# Patient Record
Sex: Female | Born: 1975 | Race: Black or African American | Hispanic: No | Marital: Single | Smoking: Former smoker
Health system: Southern US, Community
[De-identification: ages and names within clinical notes are randomized; demographics above are authoritative.]

## PROBLEM LIST (undated history)

## (undated) DIAGNOSIS — D473 Essential (hemorrhagic) thrombocythemia: Secondary | ICD-10-CM

## (undated) DIAGNOSIS — E669 Obesity, unspecified: Secondary | ICD-10-CM

## (undated) DIAGNOSIS — N491 Inflammatory disorders of spermatic cord, tunica vaginalis and vas deferens: Secondary | ICD-10-CM

## (undated) DIAGNOSIS — D759 Disease of blood and blood-forming organs, unspecified: Secondary | ICD-10-CM

## (undated) DIAGNOSIS — I10 Essential (primary) hypertension: Secondary | ICD-10-CM

## (undated) DIAGNOSIS — N926 Irregular menstruation, unspecified: Secondary | ICD-10-CM

## (undated) DIAGNOSIS — IMO0002 Reserved for concepts with insufficient information to code with codable children: Secondary | ICD-10-CM

## (undated) DIAGNOSIS — K219 Gastro-esophageal reflux disease without esophagitis: Secondary | ICD-10-CM

## (undated) DIAGNOSIS — K2970 Gastritis, unspecified, without bleeding: Secondary | ICD-10-CM

## (undated) DIAGNOSIS — Z87448 Personal history of other diseases of urinary system: Secondary | ICD-10-CM

## (undated) DIAGNOSIS — E118 Type 2 diabetes mellitus with unspecified complications: Secondary | ICD-10-CM

## (undated) DIAGNOSIS — M797 Fibromyalgia: Secondary | ICD-10-CM

## (undated) DIAGNOSIS — G629 Polyneuropathy, unspecified: Secondary | ICD-10-CM

## (undated) DIAGNOSIS — I639 Cerebral infarction, unspecified: Secondary | ICD-10-CM

## (undated) DIAGNOSIS — J45998 Other asthma: Secondary | ICD-10-CM

## (undated) DIAGNOSIS — R112 Nausea with vomiting, unspecified: Secondary | ICD-10-CM

## (undated) DIAGNOSIS — J189 Pneumonia, unspecified organism: Secondary | ICD-10-CM

## (undated) DIAGNOSIS — F329 Major depressive disorder, single episode, unspecified: Secondary | ICD-10-CM

## (undated) DIAGNOSIS — I2510 Atherosclerotic heart disease of native coronary artery without angina pectoris: Secondary | ICD-10-CM

## (undated) DIAGNOSIS — E785 Hyperlipidemia, unspecified: Secondary | ICD-10-CM

## (undated) DIAGNOSIS — D509 Iron deficiency anemia, unspecified: Secondary | ICD-10-CM

## (undated) DIAGNOSIS — B181 Chronic viral hepatitis B without delta-agent: Secondary | ICD-10-CM

## (undated) DIAGNOSIS — D75839 Thrombocytosis, unspecified: Secondary | ICD-10-CM

## (undated) DIAGNOSIS — I219 Acute myocardial infarction, unspecified: Secondary | ICD-10-CM

## (undated) DIAGNOSIS — L0292 Furuncle, unspecified: Secondary | ICD-10-CM

## (undated) DIAGNOSIS — F32A Depression, unspecified: Secondary | ICD-10-CM

## (undated) DIAGNOSIS — F419 Anxiety disorder, unspecified: Secondary | ICD-10-CM

## (undated) DIAGNOSIS — G4733 Obstructive sleep apnea (adult) (pediatric): Secondary | ICD-10-CM

## (undated) DIAGNOSIS — F1910 Other psychoactive substance abuse, uncomplicated: Secondary | ICD-10-CM

## (undated) DIAGNOSIS — E1165 Type 2 diabetes mellitus with hyperglycemia: Secondary | ICD-10-CM

## (undated) DIAGNOSIS — K3184 Gastroparesis: Secondary | ICD-10-CM

## (undated) DIAGNOSIS — L0293 Carbuncle, unspecified: Secondary | ICD-10-CM

## (undated) HISTORY — DX: Polyneuropathy, unspecified: G62.9

## (undated) HISTORY — DX: Essential (hemorrhagic) thrombocythemia: D47.3

## (undated) HISTORY — DX: Obstructive sleep apnea (adult) (pediatric): G47.33

## (undated) HISTORY — DX: Atherosclerotic heart disease of native coronary artery without angina pectoris: I25.10

## (undated) HISTORY — DX: Gastroparesis: K31.84

## (undated) HISTORY — DX: Chronic viral hepatitis B without delta-agent: B18.1

## (undated) HISTORY — DX: Fibromyalgia: M79.7

## (undated) HISTORY — DX: Carbuncle, unspecified: L02.93

## (undated) HISTORY — DX: Thrombocytosis, unspecified: D75.839

## (undated) HISTORY — DX: Furuncle, unspecified: L02.92

## (undated) HISTORY — DX: Major depressive disorder, single episode, unspecified: F32.9

## (undated) HISTORY — DX: Inflammatory disorders of spermatic cord, tunica vaginalis and vas deferens: N49.1

## (undated) HISTORY — DX: Irregular menstruation, unspecified: N92.6

## (undated) HISTORY — DX: Obesity, unspecified: E66.9

## (undated) HISTORY — DX: Personal history of other diseases of urinary system: Z87.448

## (undated) HISTORY — DX: Gastritis, unspecified, without bleeding: K29.70

## (undated) HISTORY — DX: Essential (primary) hypertension: I10

## (undated) HISTORY — DX: Depression, unspecified: F32.A

## (undated) HISTORY — DX: Hyperlipidemia, unspecified: E78.5

## (undated) HISTORY — DX: Iron deficiency anemia, unspecified: D50.9

## (undated) HISTORY — DX: Nausea with vomiting, unspecified: R11.2

---
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
## 1997-09-08 ENCOUNTER — Other Ambulatory Visit: Admission: RE | Admit: 1997-09-08 | Discharge: 1997-09-08 | Payer: Self-pay | Admitting: Obstetrics

## 1998-01-15 ENCOUNTER — Inpatient Hospital Stay (HOSPITAL_COMMUNITY): Admission: EM | Admit: 1998-01-15 | Discharge: 1998-01-19 | Payer: Self-pay | Admitting: Emergency Medicine

## 1998-01-18 ENCOUNTER — Encounter: Payer: Self-pay | Admitting: Internal Medicine

## 1998-01-28 ENCOUNTER — Encounter: Admission: RE | Admit: 1998-01-28 | Discharge: 1998-01-28 | Payer: Self-pay | Admitting: Internal Medicine

## 1998-06-18 ENCOUNTER — Emergency Department (HOSPITAL_COMMUNITY): Admission: EM | Admit: 1998-06-18 | Discharge: 1998-06-18 | Payer: Self-pay | Admitting: Emergency Medicine

## 1998-11-13 ENCOUNTER — Emergency Department (HOSPITAL_COMMUNITY): Admission: EM | Admit: 1998-11-13 | Discharge: 1998-11-13 | Payer: Self-pay | Admitting: Emergency Medicine

## 1998-11-28 ENCOUNTER — Emergency Department (HOSPITAL_COMMUNITY): Admission: EM | Admit: 1998-11-28 | Discharge: 1998-11-28 | Payer: Self-pay | Admitting: Emergency Medicine

## 1998-11-28 ENCOUNTER — Encounter: Payer: Self-pay | Admitting: *Deleted

## 1998-12-16 ENCOUNTER — Emergency Department (HOSPITAL_COMMUNITY): Admission: EM | Admit: 1998-12-16 | Discharge: 1998-12-16 | Payer: Self-pay | Admitting: Emergency Medicine

## 1999-08-08 ENCOUNTER — Inpatient Hospital Stay (HOSPITAL_COMMUNITY): Admission: AD | Admit: 1999-08-08 | Discharge: 1999-08-08 | Payer: Self-pay | Admitting: Obstetrics

## 2000-09-25 ENCOUNTER — Inpatient Hospital Stay (HOSPITAL_COMMUNITY): Admission: AD | Admit: 2000-09-25 | Discharge: 2000-09-25 | Payer: Self-pay | Admitting: Obstetrics

## 2001-06-10 ENCOUNTER — Encounter: Payer: Self-pay | Admitting: Emergency Medicine

## 2001-06-11 ENCOUNTER — Inpatient Hospital Stay (HOSPITAL_COMMUNITY): Admission: EM | Admit: 2001-06-11 | Discharge: 2001-06-14 | Payer: Self-pay | Admitting: Emergency Medicine

## 2001-06-26 ENCOUNTER — Encounter: Admission: RE | Admit: 2001-06-26 | Discharge: 2001-06-26 | Payer: Self-pay | Admitting: Internal Medicine

## 2001-06-26 ENCOUNTER — Encounter: Payer: Self-pay | Admitting: *Deleted

## 2001-06-26 ENCOUNTER — Ambulatory Visit (HOSPITAL_COMMUNITY): Admission: RE | Admit: 2001-06-26 | Discharge: 2001-06-26 | Payer: Self-pay

## 2001-10-03 ENCOUNTER — Inpatient Hospital Stay (HOSPITAL_COMMUNITY): Admission: AD | Admit: 2001-10-03 | Discharge: 2001-10-03 | Payer: Self-pay | Admitting: Obstetrics

## 2001-10-03 ENCOUNTER — Encounter: Payer: Self-pay | Admitting: Obstetrics

## 2002-03-25 ENCOUNTER — Encounter: Payer: Self-pay | Admitting: Emergency Medicine

## 2002-03-25 ENCOUNTER — Emergency Department (HOSPITAL_COMMUNITY): Admission: EM | Admit: 2002-03-25 | Discharge: 2002-03-25 | Payer: Self-pay | Admitting: Emergency Medicine

## 2003-03-02 ENCOUNTER — Emergency Department (HOSPITAL_COMMUNITY): Admission: EM | Admit: 2003-03-02 | Discharge: 2003-03-02 | Payer: Self-pay | Admitting: Emergency Medicine

## 2003-03-07 ENCOUNTER — Emergency Department (HOSPITAL_COMMUNITY): Admission: EM | Admit: 2003-03-07 | Discharge: 2003-03-07 | Payer: Self-pay | Admitting: Emergency Medicine

## 2003-03-09 ENCOUNTER — Emergency Department (HOSPITAL_COMMUNITY): Admission: EM | Admit: 2003-03-09 | Discharge: 2003-03-09 | Payer: Self-pay | Admitting: Emergency Medicine

## 2003-03-12 ENCOUNTER — Emergency Department (HOSPITAL_COMMUNITY): Admission: EM | Admit: 2003-03-12 | Discharge: 2003-03-12 | Payer: Self-pay | Admitting: Emergency Medicine

## 2003-09-06 ENCOUNTER — Emergency Department (HOSPITAL_COMMUNITY): Admission: EM | Admit: 2003-09-06 | Discharge: 2003-09-06 | Payer: Self-pay | Admitting: Emergency Medicine

## 2003-09-24 ENCOUNTER — Emergency Department (HOSPITAL_COMMUNITY): Admission: EM | Admit: 2003-09-24 | Discharge: 2003-09-24 | Payer: Self-pay | Admitting: Emergency Medicine

## 2004-01-15 ENCOUNTER — Emergency Department (HOSPITAL_COMMUNITY): Admission: EM | Admit: 2004-01-15 | Discharge: 2004-01-15 | Payer: Self-pay | Admitting: Emergency Medicine

## 2004-06-08 ENCOUNTER — Emergency Department (HOSPITAL_COMMUNITY): Admission: EM | Admit: 2004-06-08 | Discharge: 2004-06-08 | Payer: Self-pay | Admitting: Emergency Medicine

## 2004-09-24 ENCOUNTER — Emergency Department (HOSPITAL_COMMUNITY): Admission: EM | Admit: 2004-09-24 | Discharge: 2004-09-25 | Payer: Self-pay | Admitting: Emergency Medicine

## 2004-11-16 ENCOUNTER — Emergency Department (HOSPITAL_COMMUNITY): Admission: EM | Admit: 2004-11-16 | Discharge: 2004-11-17 | Payer: Self-pay | Admitting: Emergency Medicine

## 2004-11-25 ENCOUNTER — Emergency Department (HOSPITAL_COMMUNITY): Admission: EM | Admit: 2004-11-25 | Discharge: 2004-11-25 | Payer: Self-pay | Admitting: Emergency Medicine

## 2004-11-28 ENCOUNTER — Ambulatory Visit: Payer: Self-pay | Admitting: Internal Medicine

## 2004-12-17 ENCOUNTER — Emergency Department (HOSPITAL_COMMUNITY): Admission: EM | Admit: 2004-12-17 | Discharge: 2004-12-17 | Payer: Self-pay | Admitting: Emergency Medicine

## 2004-12-27 ENCOUNTER — Emergency Department (HOSPITAL_COMMUNITY): Admission: EM | Admit: 2004-12-27 | Discharge: 2004-12-27 | Payer: Self-pay | Admitting: Emergency Medicine

## 2005-01-09 ENCOUNTER — Inpatient Hospital Stay (HOSPITAL_COMMUNITY): Admission: AD | Admit: 2005-01-09 | Discharge: 2005-01-18 | Payer: Self-pay | Admitting: Hospitalist

## 2005-01-09 ENCOUNTER — Ambulatory Visit: Payer: Self-pay | Admitting: Hospitalist

## 2005-01-09 ENCOUNTER — Encounter: Payer: Self-pay | Admitting: Emergency Medicine

## 2005-01-20 ENCOUNTER — Ambulatory Visit: Payer: Self-pay | Admitting: Internal Medicine

## 2005-01-27 ENCOUNTER — Ambulatory Visit: Payer: Self-pay | Admitting: Hospitalist

## 2005-02-03 ENCOUNTER — Ambulatory Visit: Payer: Self-pay | Admitting: Internal Medicine

## 2005-02-07 ENCOUNTER — Ambulatory Visit: Payer: Self-pay | Admitting: Internal Medicine

## 2005-02-08 ENCOUNTER — Ambulatory Visit: Payer: Self-pay | Admitting: Internal Medicine

## 2005-04-26 ENCOUNTER — Emergency Department (HOSPITAL_COMMUNITY): Admission: EM | Admit: 2005-04-26 | Discharge: 2005-04-26 | Payer: Self-pay | Admitting: Emergency Medicine

## 2005-05-30 IMAGING — CR DG CHEST 2V
2 series · 2 of 2 positions shown · non-contrast
Comparison: none

CLINICAL DATA: Headache and cough.  History of asthma.

[view not recorded (1 of 2)]
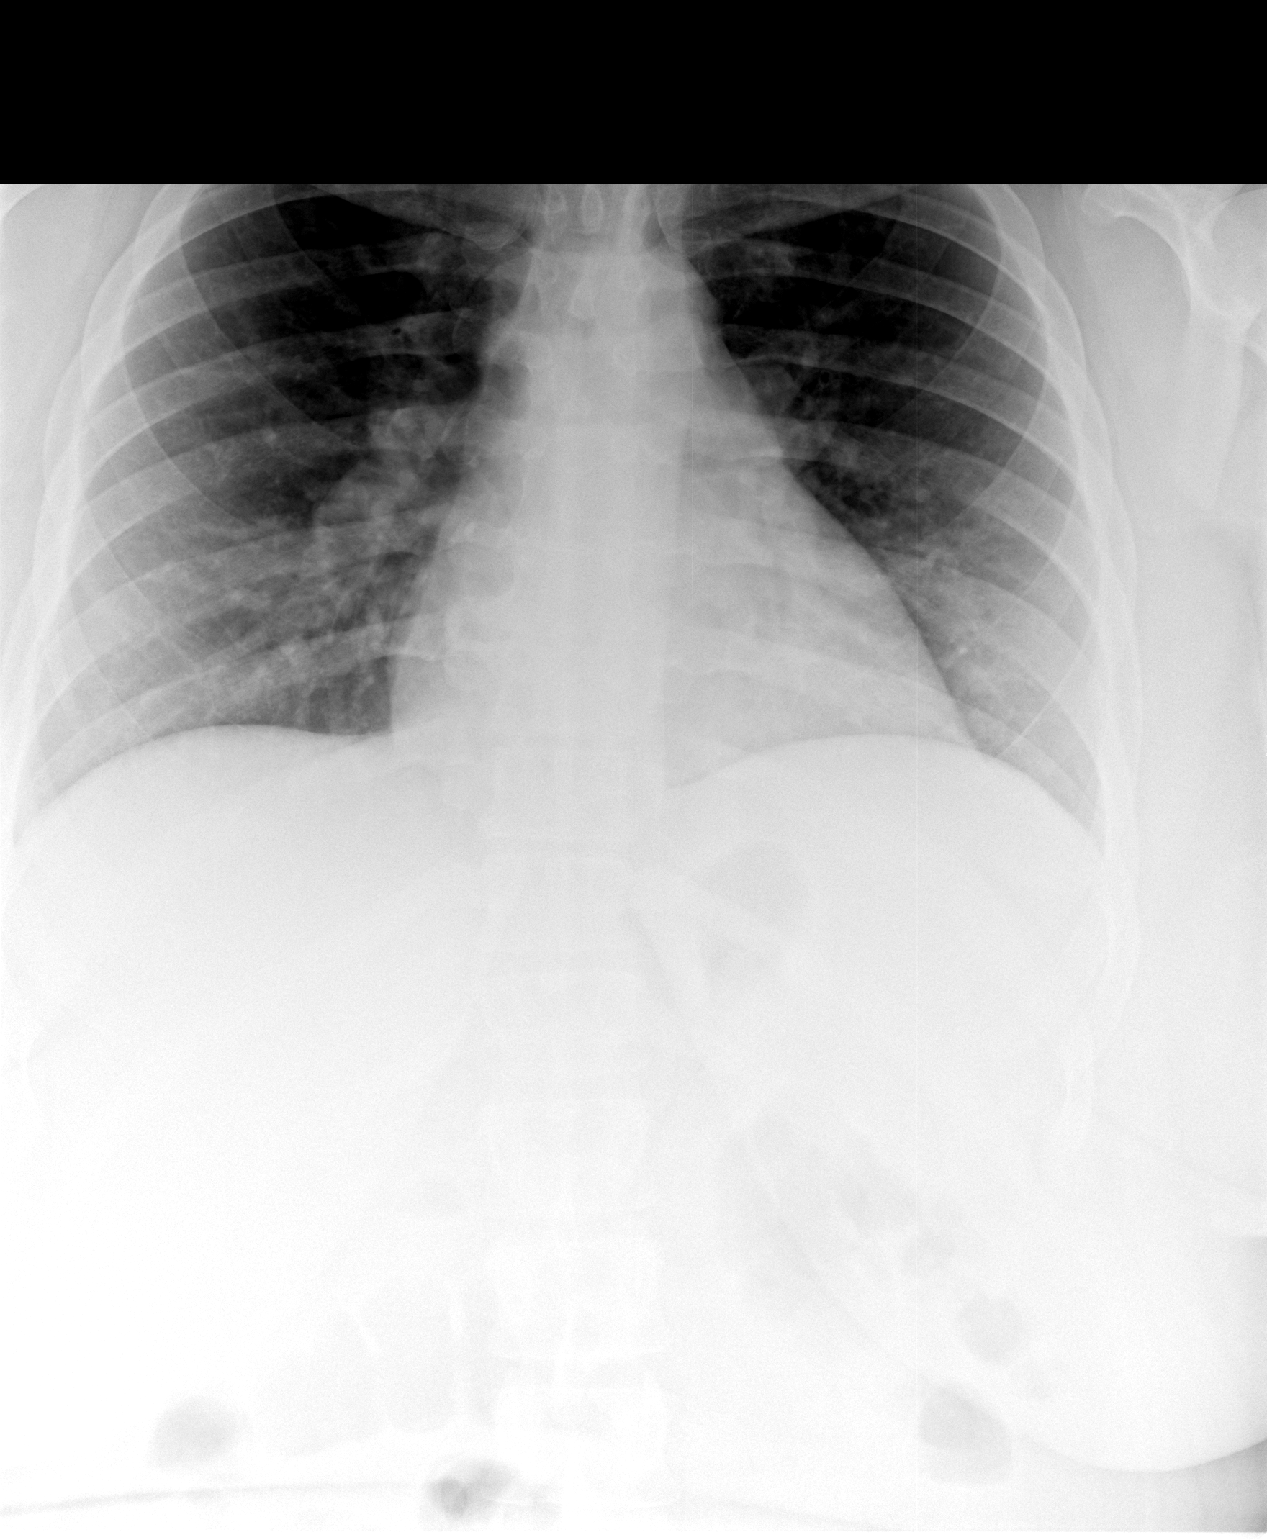

[view not recorded (2 of 2)]
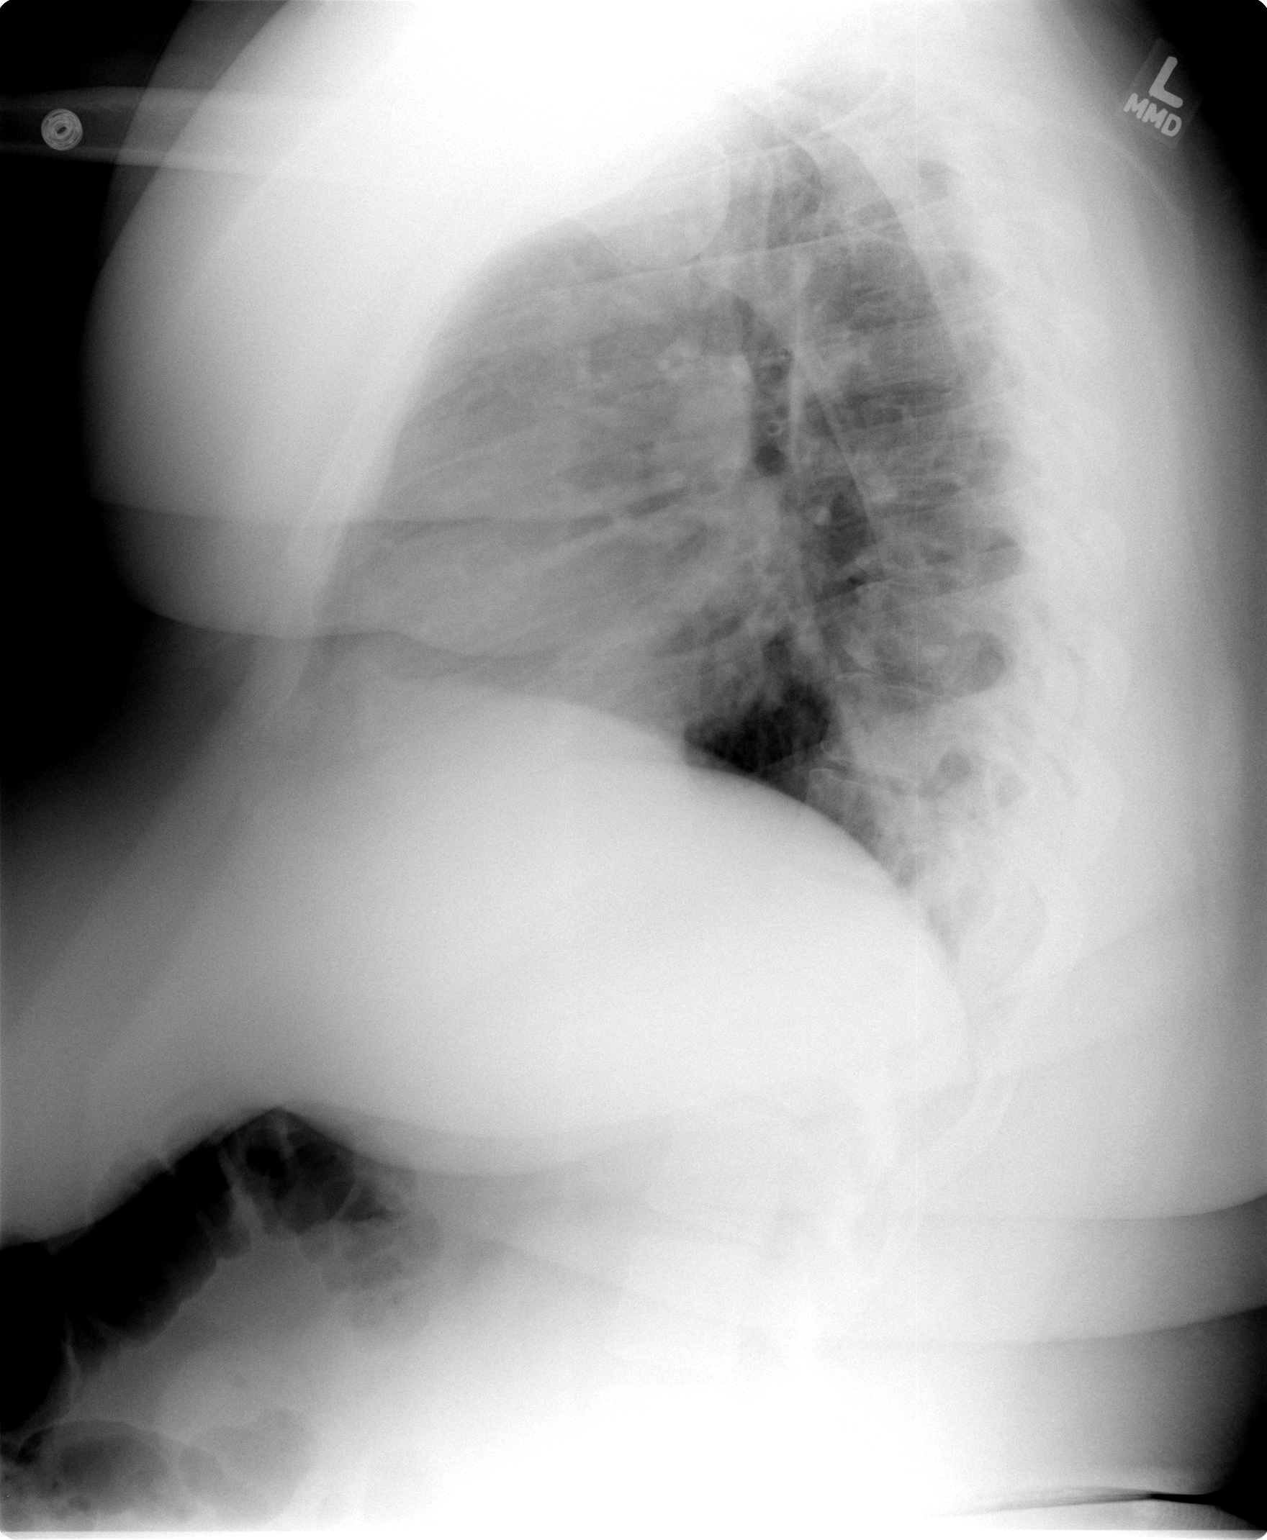

[2 of 2 positions shown; findings below may reference images not displayed]

PA AND LATERAL CHEST 
 Comparison 03/25/02.  

 The cardiomediastinal contours are stable.  There is stable peribronchial thickening.  Faint density in the left lung base does not obscure the hemidiaphragm but is not clearly attributed to overlying soft tissues and could reflect early infiltrate.  The right lung is clear.  There is no pleural effusion.  Osseous structures appear unremarkable.  

 IMPRESSION
 Stable cardiomegaly and peribronchial thickening.  I cannot exclude an early infiltrate at the left lung base. 

 [REDACTED]

## 2005-06-26 ENCOUNTER — Emergency Department (HOSPITAL_COMMUNITY): Admission: EM | Admit: 2005-06-26 | Discharge: 2005-06-26 | Payer: Self-pay | Admitting: Emergency Medicine

## 2005-07-13 ENCOUNTER — Emergency Department (HOSPITAL_COMMUNITY): Admission: EM | Admit: 2005-07-13 | Discharge: 2005-07-14 | Payer: Self-pay | Admitting: Emergency Medicine

## 2005-07-23 ENCOUNTER — Emergency Department (HOSPITAL_COMMUNITY): Admission: EM | Admit: 2005-07-23 | Discharge: 2005-07-23 | Payer: Self-pay | Admitting: Emergency Medicine

## 2005-11-03 ENCOUNTER — Ambulatory Visit: Payer: Self-pay | Admitting: Internal Medicine

## 2005-11-06 ENCOUNTER — Ambulatory Visit: Payer: Self-pay | Admitting: Internal Medicine

## 2005-11-14 ENCOUNTER — Ambulatory Visit: Payer: Self-pay | Admitting: Hospitalist

## 2005-11-15 ENCOUNTER — Ambulatory Visit: Payer: Self-pay | Admitting: Hospitalist

## 2006-01-03 ENCOUNTER — Ambulatory Visit (HOSPITAL_COMMUNITY): Admission: RE | Admit: 2006-01-03 | Discharge: 2006-01-03 | Payer: Self-pay | Admitting: Internal Medicine

## 2006-01-03 ENCOUNTER — Ambulatory Visit: Payer: Self-pay | Admitting: Cardiovascular Disease

## 2006-01-03 ENCOUNTER — Encounter: Payer: Self-pay | Admitting: Cardiovascular Disease

## 2006-01-05 ENCOUNTER — Ambulatory Visit: Payer: Self-pay | Admitting: Internal Medicine

## 2006-01-16 DIAGNOSIS — N926 Irregular menstruation, unspecified: Secondary | ICD-10-CM

## 2006-01-16 DIAGNOSIS — R609 Edema, unspecified: Secondary | ICD-10-CM | POA: Insufficient documentation

## 2006-01-16 DIAGNOSIS — I10 Essential (primary) hypertension: Secondary | ICD-10-CM | POA: Insufficient documentation
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
## 2006-03-05 ENCOUNTER — Emergency Department (HOSPITAL_COMMUNITY): Admission: EM | Admit: 2006-03-05 | Discharge: 2006-03-05 | Payer: Self-pay | Admitting: Emergency Medicine

## 2006-04-05 ENCOUNTER — Ambulatory Visit: Payer: Self-pay | Admitting: *Deleted

## 2006-04-05 ENCOUNTER — Encounter (INDEPENDENT_AMBULATORY_CARE_PROVIDER_SITE_OTHER): Payer: Self-pay | Admitting: Infectious Diseases

## 2006-04-05 LAB — CONVERTED CEMR LAB
ALT: 12 units/L (ref 0–35)
Alkaline Phosphatase: 67 units/L (ref 39–117)
MCHC: 32 g/dL — ABNORMAL LOW (ref 33.1–35.4)
MCV: 69.6 fL — ABNORMAL LOW (ref 78.8–100.0)
Platelets: 575 10*3/uL — ABNORMAL HIGH (ref 152–374)
RDW: 18.1 % — ABNORMAL HIGH (ref 11.5–15.3)
Rhuematoid fact SerPl-aCnc: <20 intl units/mL (ref 0–20)
Sodium: 139 meq/L (ref 135–145)
Total Bilirubin: 0.4 mg/dL (ref 0.3–1.2)
Total Protein: 7.8 g/dL (ref 6.0–8.3)

## 2006-04-24 HISTORY — PX: CORONARY ANGIOPLASTY WITH STENT PLACEMENT: SHX49

## 2006-04-25 ENCOUNTER — Emergency Department (HOSPITAL_COMMUNITY): Admission: EM | Admit: 2006-04-25 | Discharge: 2006-04-25 | Payer: Self-pay | Admitting: Emergency Medicine

## 2006-05-06 ENCOUNTER — Inpatient Hospital Stay (HOSPITAL_COMMUNITY): Admission: EM | Admit: 2006-05-06 | Discharge: 2006-05-11 | Payer: Self-pay | Admitting: Emergency Medicine

## 2006-05-10 ENCOUNTER — Encounter (INDEPENDENT_AMBULATORY_CARE_PROVIDER_SITE_OTHER): Payer: Self-pay | Admitting: *Deleted

## 2006-05-15 DIAGNOSIS — J45909 Unspecified asthma, uncomplicated: Secondary | ICD-10-CM

## 2006-05-15 DIAGNOSIS — G43909 Migraine, unspecified, not intractable, without status migrainosus: Secondary | ICD-10-CM | POA: Insufficient documentation

## 2006-05-25 DIAGNOSIS — I219 Acute myocardial infarction, unspecified: Secondary | ICD-10-CM

## 2006-05-25 HISTORY — DX: Acute myocardial infarction, unspecified: I21.9

## 2006-06-06 ENCOUNTER — Emergency Department (HOSPITAL_COMMUNITY): Admission: EM | Admit: 2006-06-06 | Discharge: 2006-06-06 | Payer: Self-pay | Admitting: Emergency Medicine

## 2006-06-07 ENCOUNTER — Inpatient Hospital Stay (HOSPITAL_COMMUNITY): Admission: EM | Admit: 2006-06-07 | Discharge: 2006-06-09 | Payer: Self-pay | Admitting: Emergency Medicine

## 2006-06-15 ENCOUNTER — Ambulatory Visit: Payer: Self-pay | Admitting: Internal Medicine

## 2006-06-15 ENCOUNTER — Inpatient Hospital Stay (HOSPITAL_COMMUNITY): Admission: EM | Admit: 2006-06-15 | Discharge: 2006-06-21 | Payer: Self-pay | Admitting: *Deleted

## 2006-06-15 ENCOUNTER — Ambulatory Visit: Payer: Self-pay | Admitting: Cardiology

## 2006-06-15 DIAGNOSIS — I2510 Atherosclerotic heart disease of native coronary artery without angina pectoris: Secondary | ICD-10-CM

## 2006-06-15 DIAGNOSIS — I259 Chronic ischemic heart disease, unspecified: Secondary | ICD-10-CM | POA: Insufficient documentation

## 2006-06-15 HISTORY — DX: Atherosclerotic heart disease of native coronary artery without angina pectoris: I25.10

## 2006-06-27 DIAGNOSIS — F341 Dysthymic disorder: Secondary | ICD-10-CM

## 2006-07-11 ENCOUNTER — Encounter (INDEPENDENT_AMBULATORY_CARE_PROVIDER_SITE_OTHER): Payer: Self-pay | Admitting: *Deleted

## 2006-07-11 ENCOUNTER — Ambulatory Visit (HOSPITAL_BASED_OUTPATIENT_CLINIC_OR_DEPARTMENT_OTHER): Admission: RE | Admit: 2006-07-11 | Discharge: 2006-07-11 | Payer: Self-pay | Admitting: Internal Medicine

## 2006-07-15 ENCOUNTER — Ambulatory Visit: Payer: Self-pay | Admitting: Internal Medicine

## 2006-07-16 ENCOUNTER — Encounter (INDEPENDENT_AMBULATORY_CARE_PROVIDER_SITE_OTHER): Payer: Self-pay | Admitting: *Deleted

## 2006-07-16 ENCOUNTER — Inpatient Hospital Stay (HOSPITAL_COMMUNITY): Admission: EM | Admit: 2006-07-16 | Discharge: 2006-07-19 | Payer: Self-pay | Admitting: Emergency Medicine

## 2006-08-21 ENCOUNTER — Encounter (INDEPENDENT_AMBULATORY_CARE_PROVIDER_SITE_OTHER): Payer: Self-pay | Admitting: *Deleted

## 2006-09-15 IMAGING — CT CT ABDOMEN W/ CM
1 of 3 series · 14 of 32 positions shown, 19 images · IV contrast (omnipaque)
Comparison: none

CLINICAL DATA: Abdominal pain, particularly right lower quadrant. 
 ABDOMEN CT WITH CONTRAST:
TECHNIQUE: Multidetector CT imaging of the abdomen was performed following the standard protocol during bolus administration of intravenous contrast.
 Contrast:  125 cc Omnipaque 300.  Oral contrast was given.
TECHNIQUE: Multidetector CT imaging of the pelvis was performed following the standard protocol during bolus administration of intravenous contrast.

[Series 2: abd_pel 5.0 b40f st · axial · 0.82mm/px · z∈[-534,-70]mm · 14 of 103 slices shown, 19 images]
[im 5/103  soft-tissue]
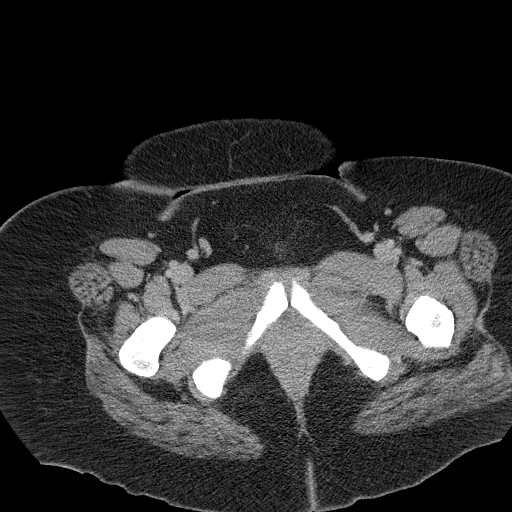
[im 5/103  bone]
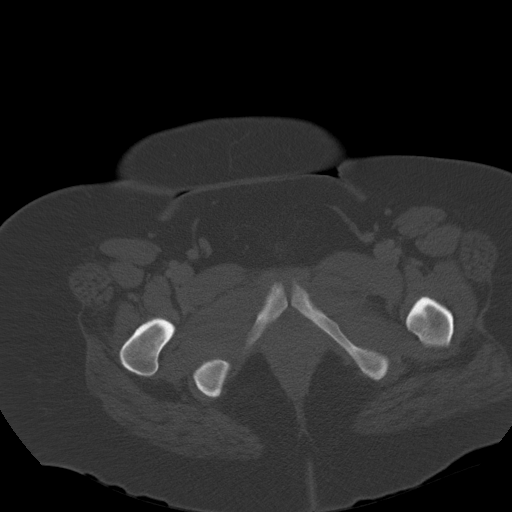
[im 15/103  soft-tissue]
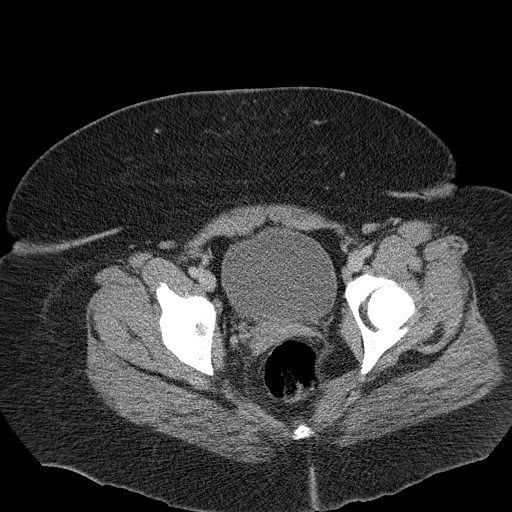
[im 20/103  soft-tissue]
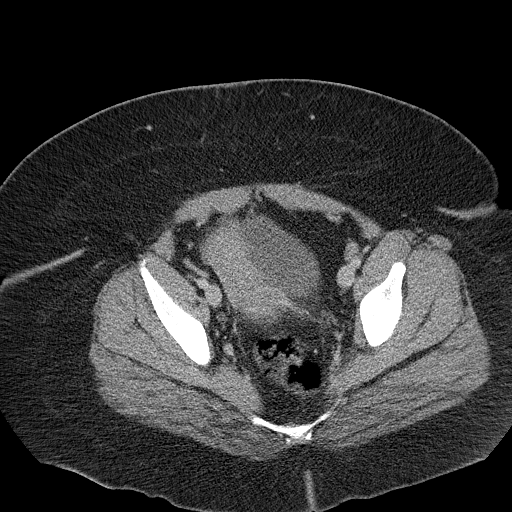
[im 30/103  soft-tissue]
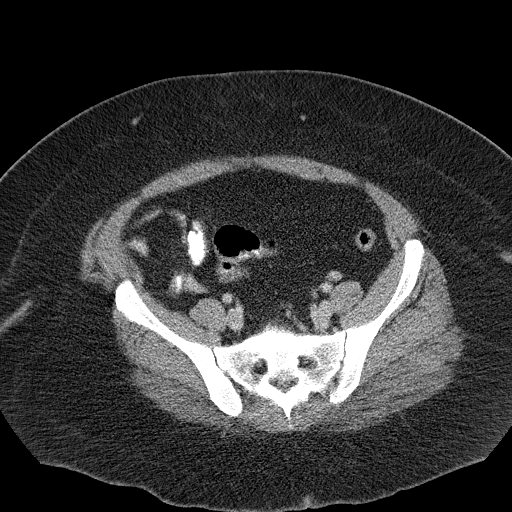
[im 35/103  soft-tissue]
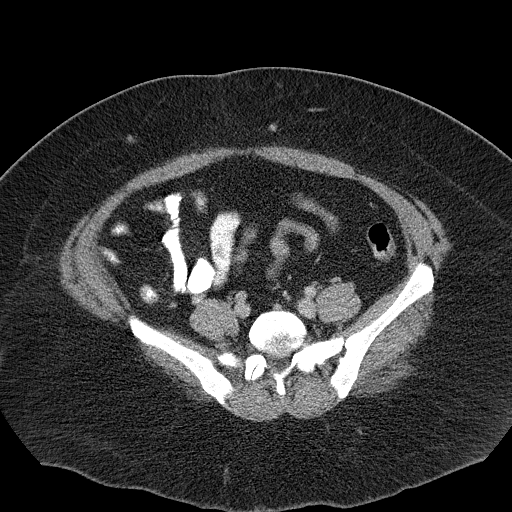
[im 44/103  soft-tissue]
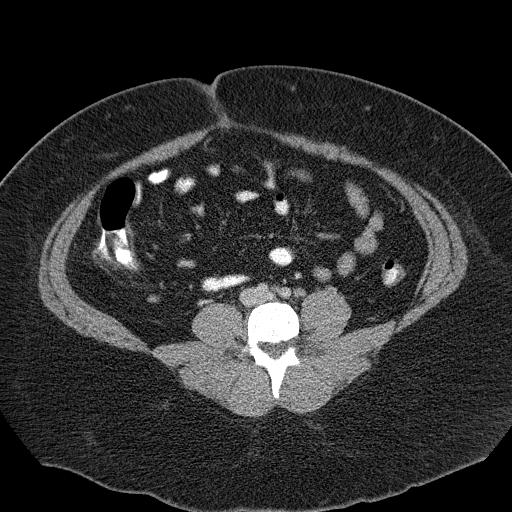
[im 54/103  soft-tissue]
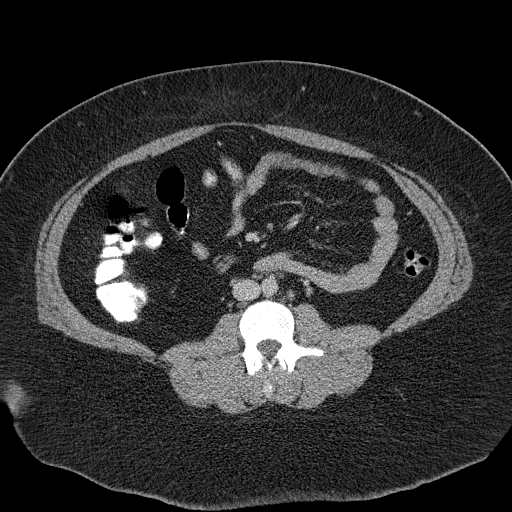
[im 59/103  soft-tissue]
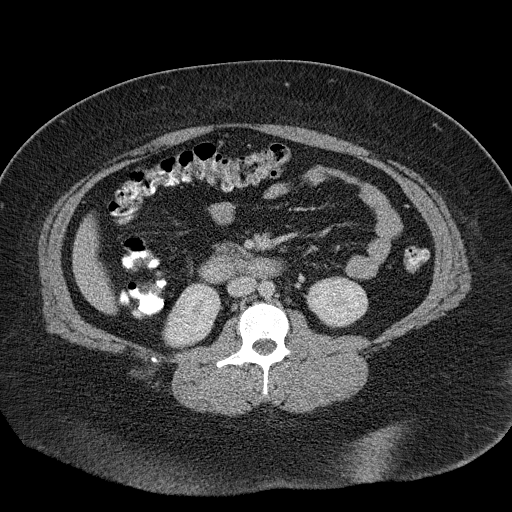
[im 69/103  soft-tissue]
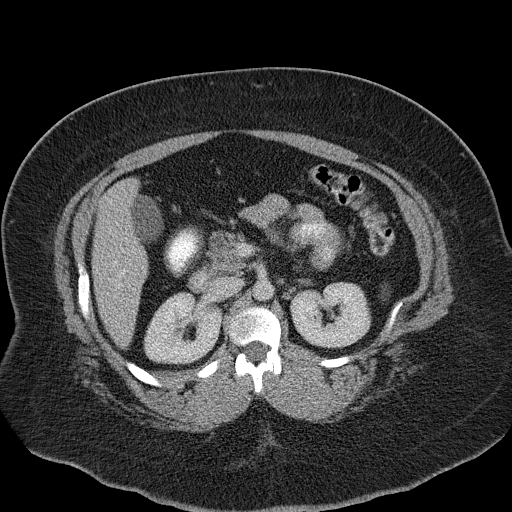
[im 69/103  bone]
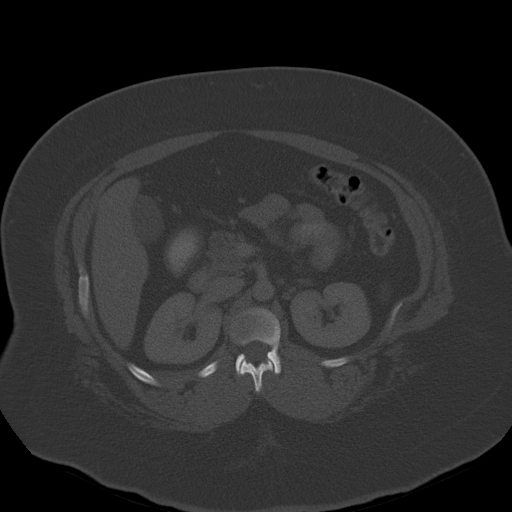
[im 73/103  soft-tissue]
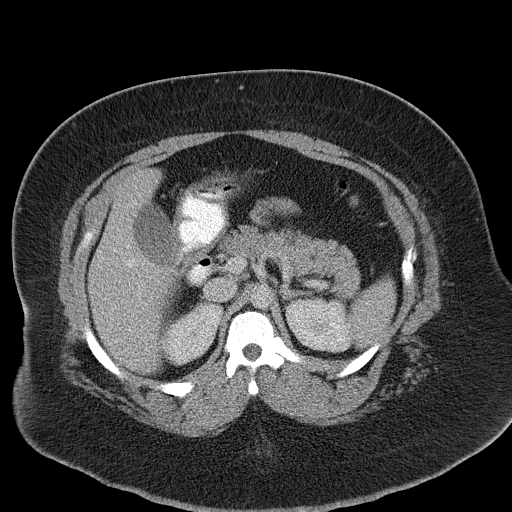
[im 83/103  soft-tissue]
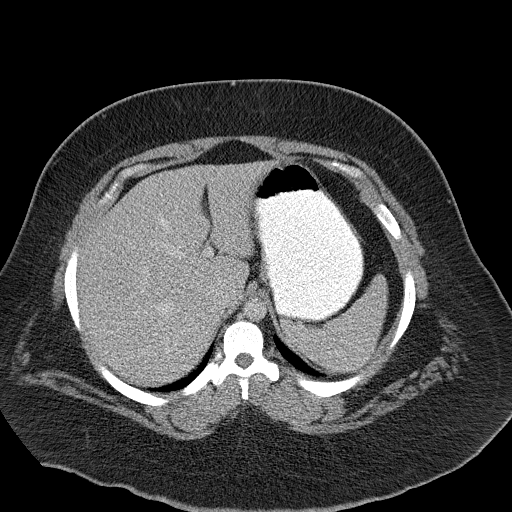
[im 83/103  lung]
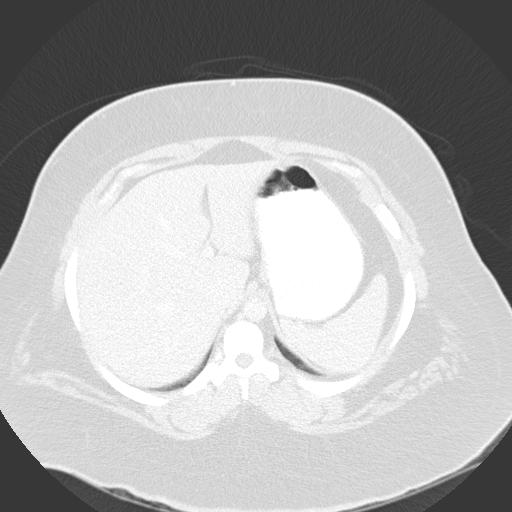
[im 88/103  soft-tissue]
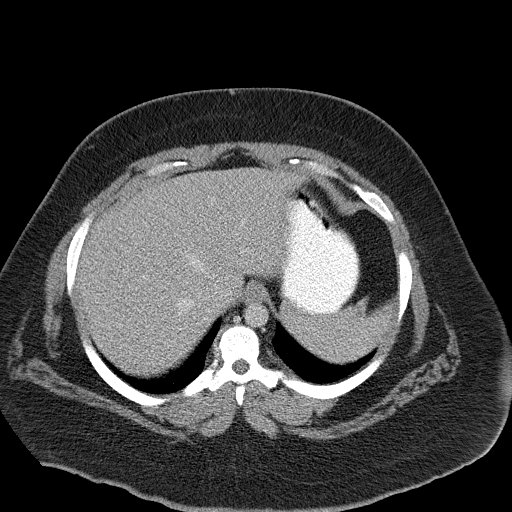
[im 88/103  lung]
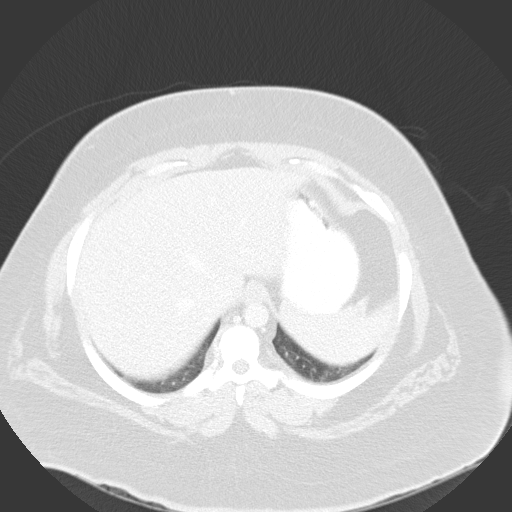
[im 93/103  lung]
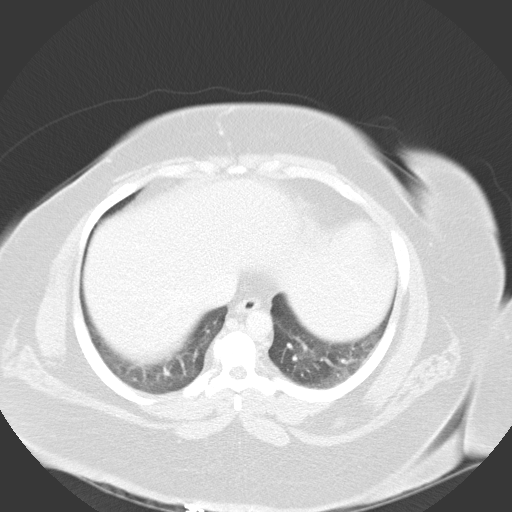
[im 98/103  soft-tissue]
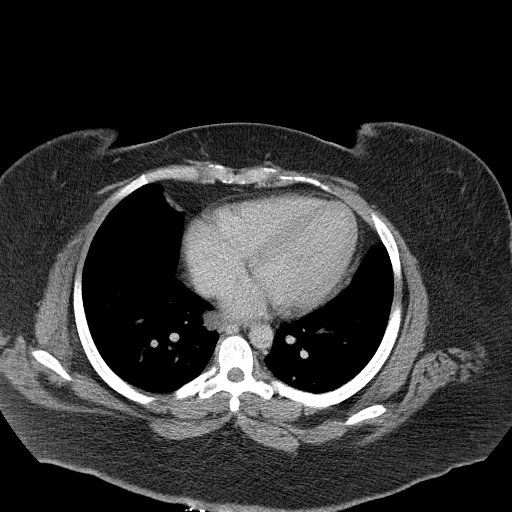
[im 98/103  lung]
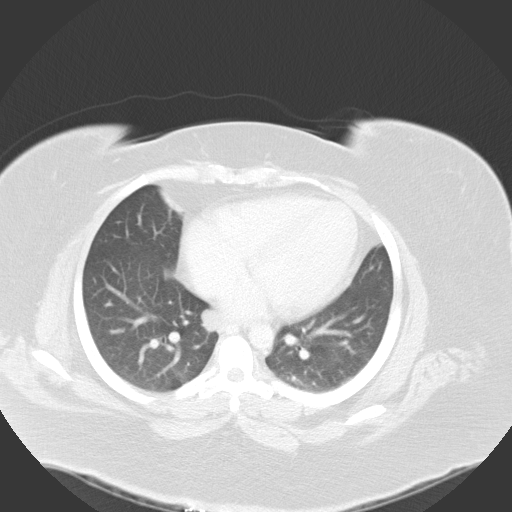

[14 of 32 positions shown; findings below may reference images not displayed]

FINDINGS: The lung bases are clear.  Resolution is somewhat diminished by the patient?s body habitus.  The liver appears grossly normal with no focal abnormality.  No calcified gallstones are seen.  The pancreas is normal in size with normal peripancreatic fat planes.  The adrenal glands and spleen appear normal.  The kidneys enhance normally and on delayed images, the pelvocaliceal systems appear normal.  No adenopathy is seen.  The abdominal aorta is normal in caliber. There are a few small nodes in the right lower quadrant.
IMPRESSION: Negative CT of the abdomen.  
 PELVIS CT WITH CONTRAST:
FINDINGS: Scans were continued through the pelvis after oral and IV contrast media were given.  The appendix is well seen and appears normal as is the terminal ileum.  The urinary bladder is unremarkable.  The uterus appears anteverted.  There are low attenuation structures in both adnexa consistent with small cysts.  No significant free fluid is seen.  No evidence of abdominal wall hernia is seen.  No bony abnormality is noted.
IMPRESSION: The appendix and terminal ileum appear normal.  Small ovarian follicles are present.  No acute abnormality is seen.

## 2006-09-16 IMAGING — CR DG CHEST 2V
2 series · 2 of 2 positions shown · non-contrast
Comparison: Two view chest x-ray 09/24/2003.

CLINICAL DATA: Pyelonephritis. Left-sided chest pain.

CHEST - 2 VIEW  01/10/2005:

[view not recorded (1 of 2)]
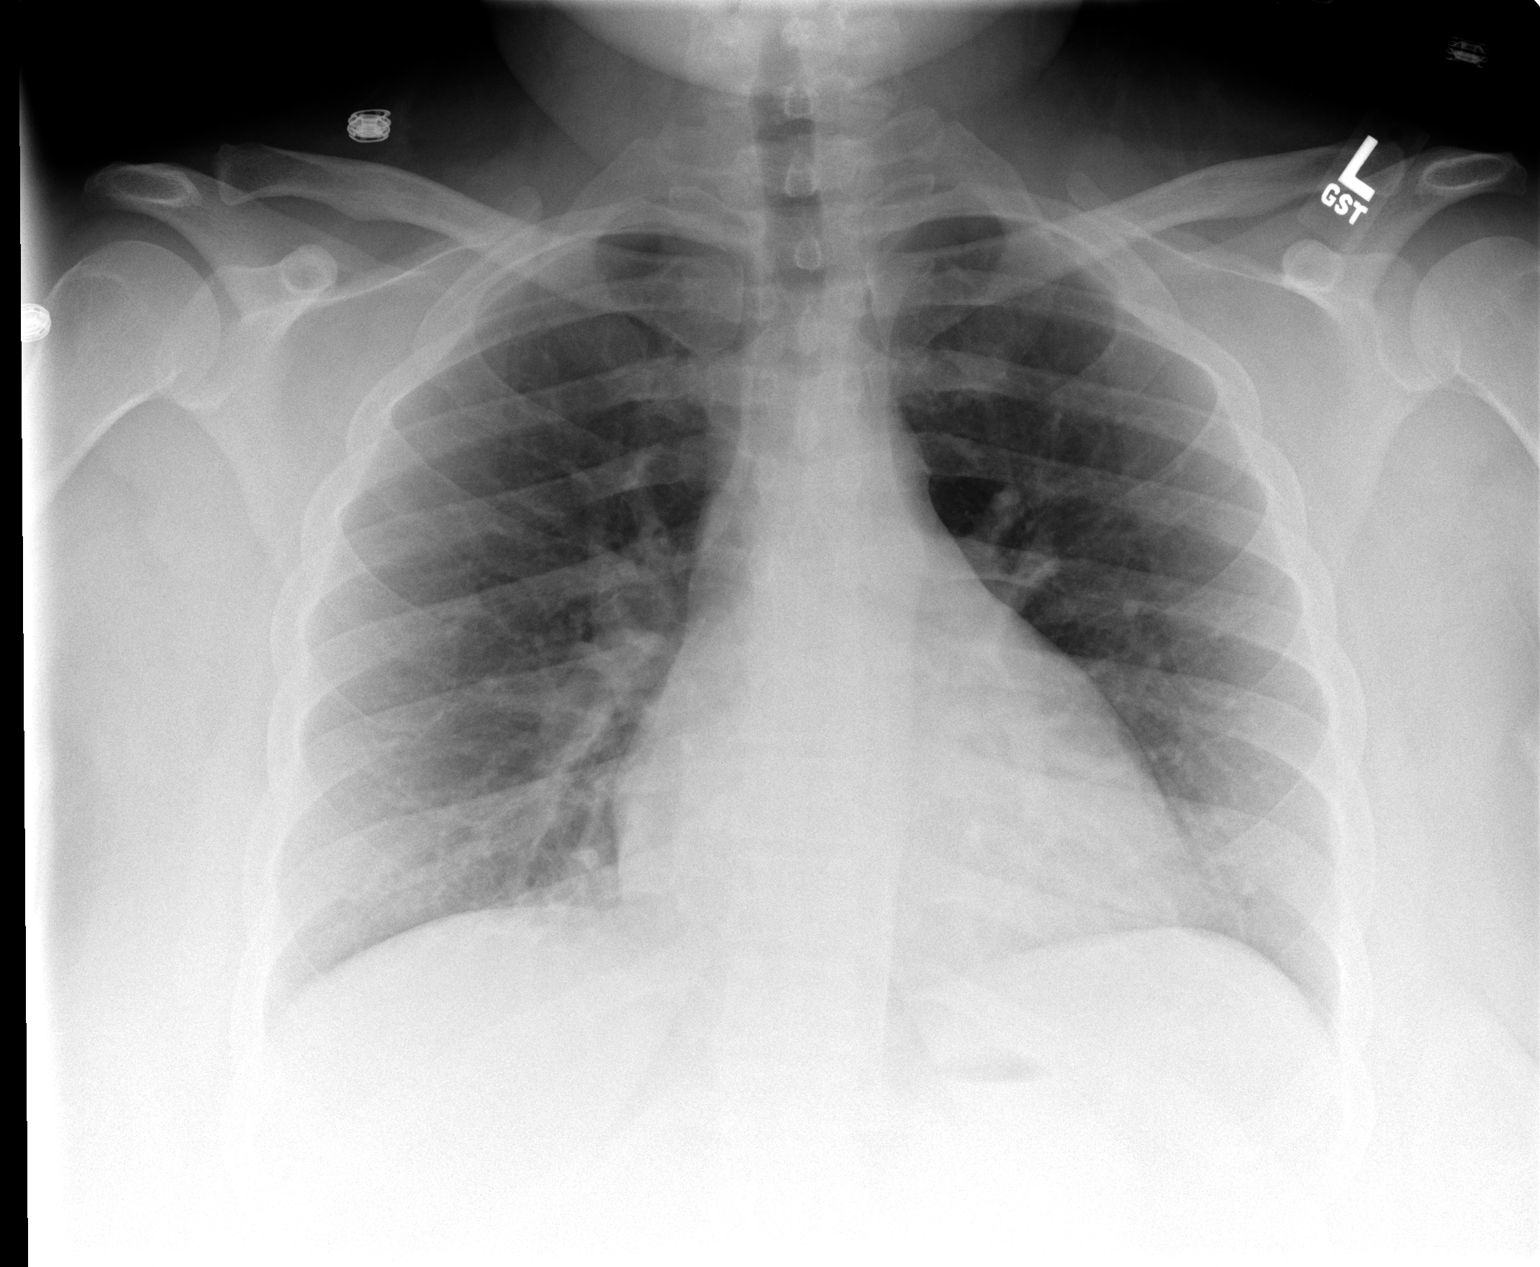

[view not recorded (2 of 2)]
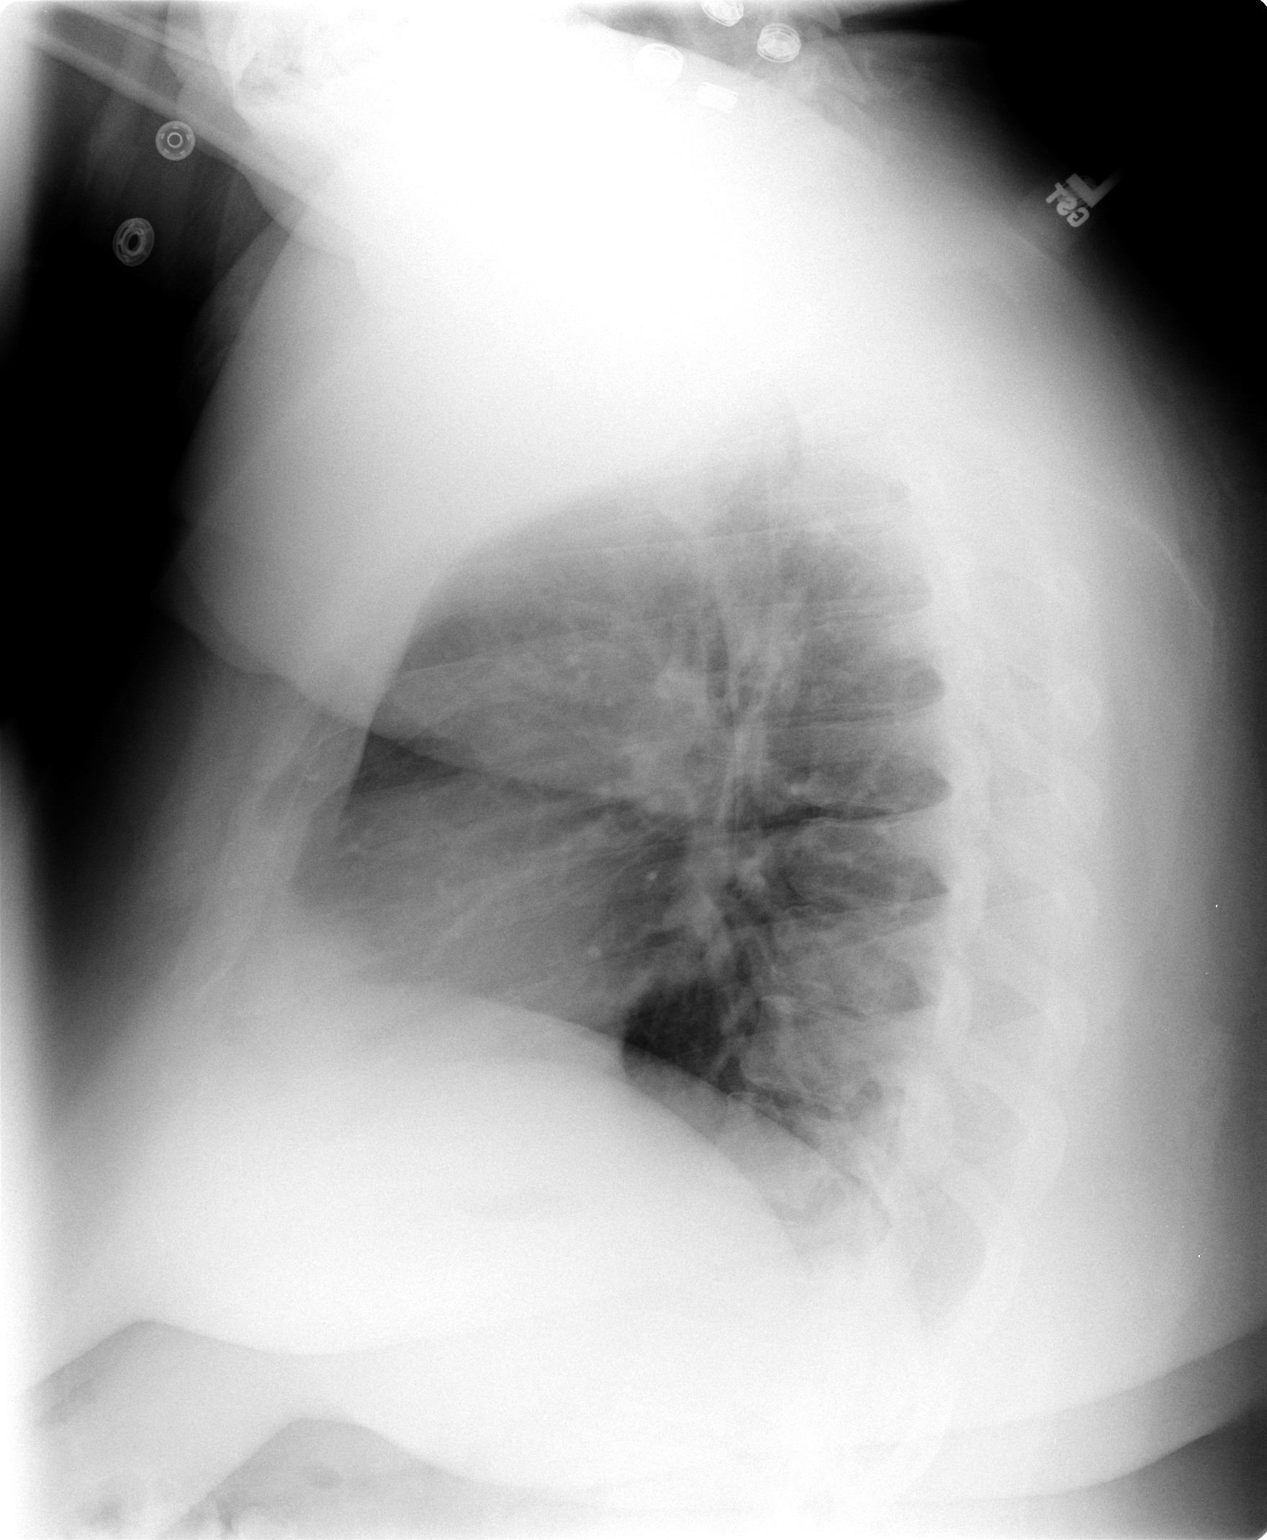

[2 of 2 positions shown; findings below may reference images not displayed]

FINDINGS: The heart is mildly enlarged but stable. The lungs are clear. Mild
degenerative changes are present in the thoracic spine.
IMPRESSION: Stable mild cardiomegaly. No evidence of acute disease.

## 2006-09-19 IMAGING — CT CT ABDOMEN W/O CM
1 series · 16 of 32 positions shown, 20 images · IV contrast (agent unspecified)
Comparison: Contrast enhanced study dated 01/09/05.

CLINICAL DATA: Pyelonephritis.  
 ABDOMEN CT WITHOUT CONTRAST:
TECHNIQUE: Multidetector CT imaging of the abdomen was performed following the standard protocol without IV contrast.
TECHNIQUE: Multidetector CT imaging of the pelvis was performed following the standard protocol without IV contrast.

[Series 2: stone proto 5.0 b31f · axial · 0.87mm/px · z∈[-482,-62]mm · 16 of 95 slices shown, 20 images]
[im 7/95  soft-tissue]
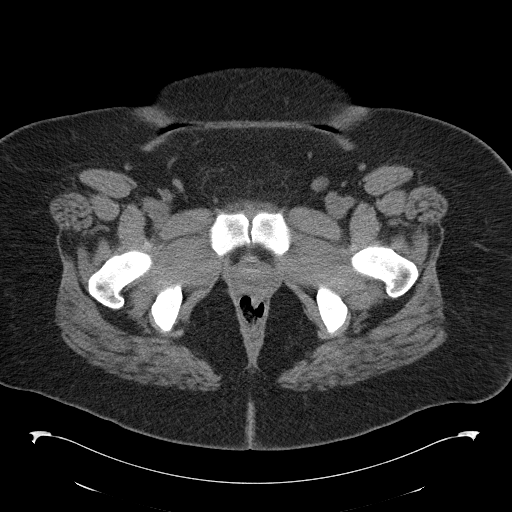
[im 7/95  bone]
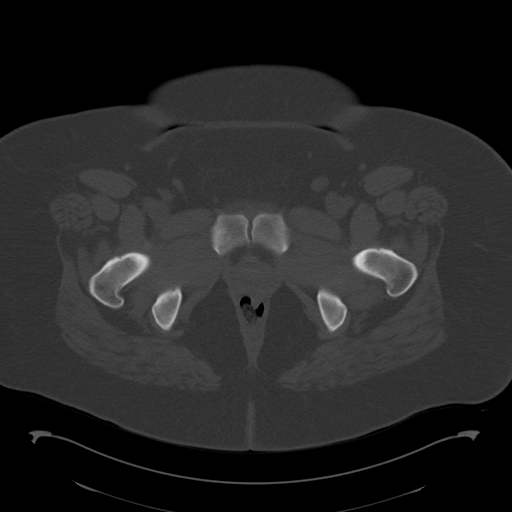
[im 13/95  soft-tissue]
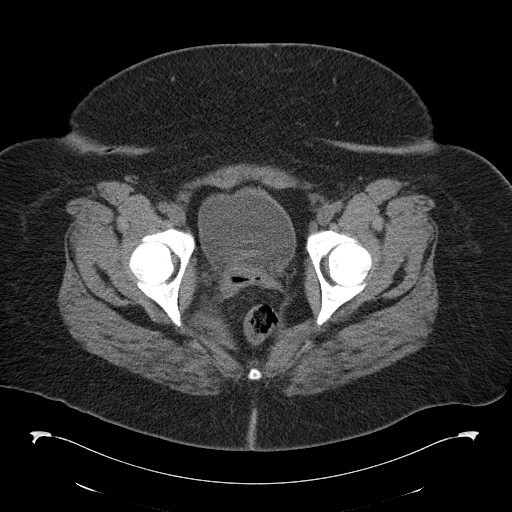
[im 19/95  soft-tissue]
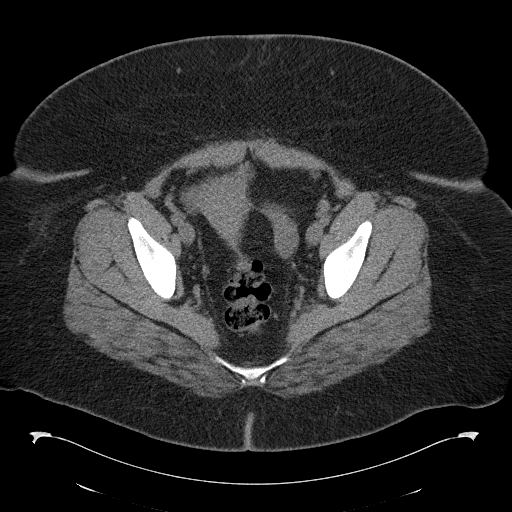
[im 25/95  soft-tissue]
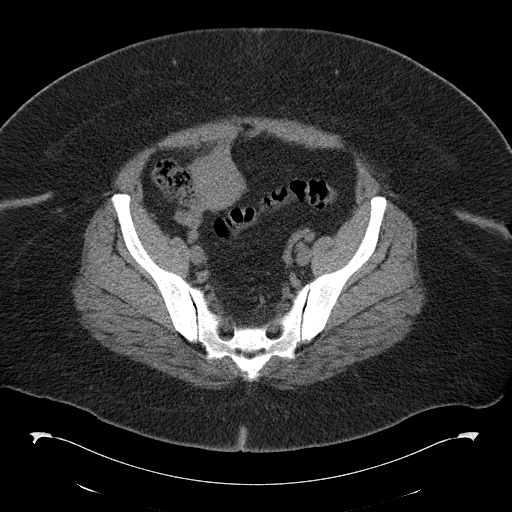
[im 31/95  soft-tissue]
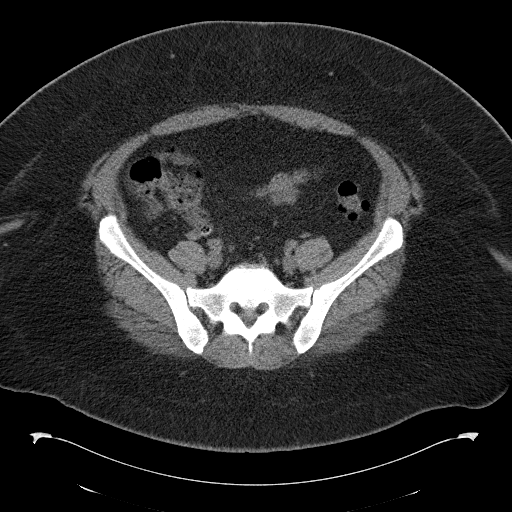
[im 37/95  soft-tissue]
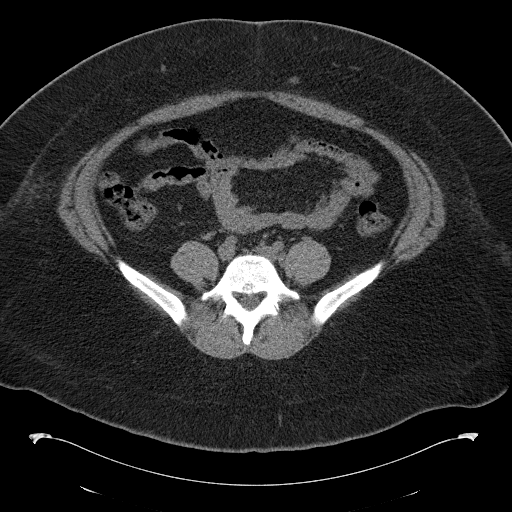
[im 43/95  soft-tissue]
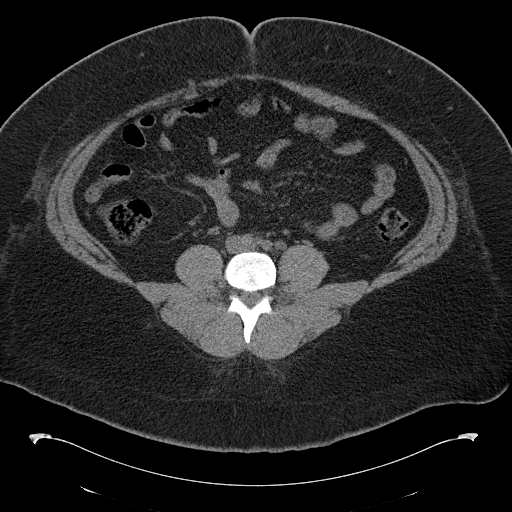
[im 52/95  soft-tissue]
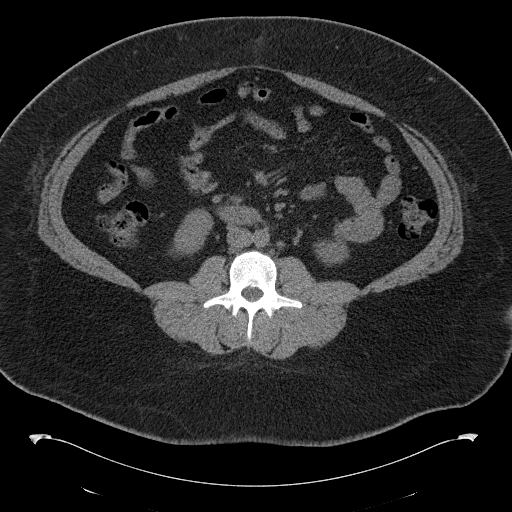
[im 58/95  soft-tissue]
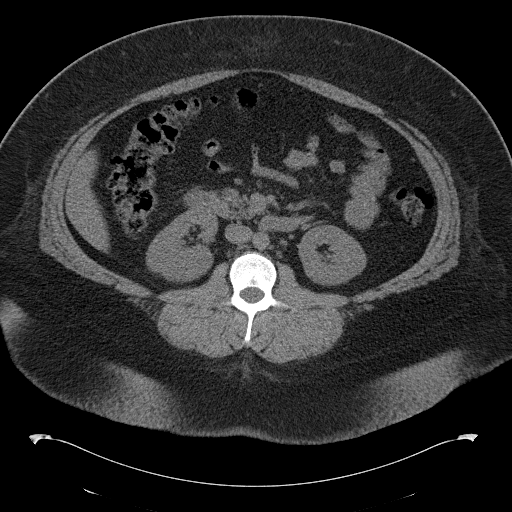
[im 58/95  bone]
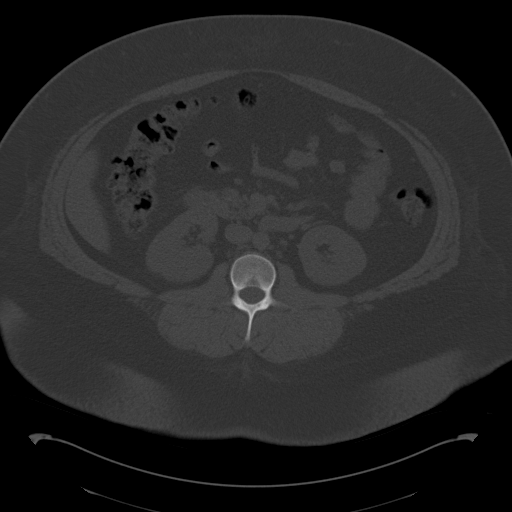
[im 64/95  soft-tissue]
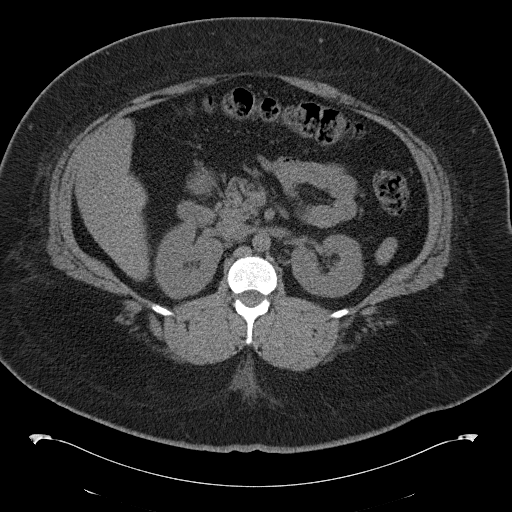
[im 70/95  soft-tissue]
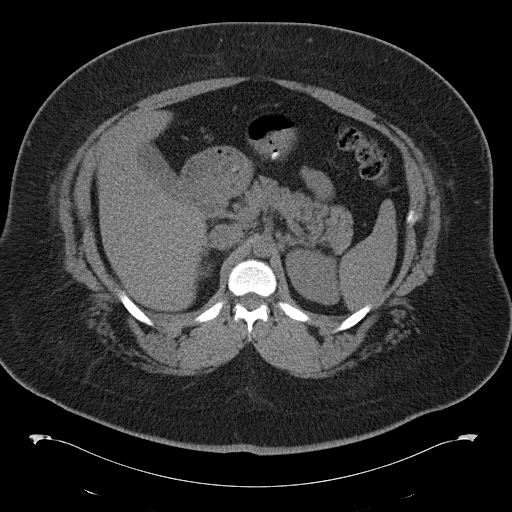
[im 76/95  soft-tissue]
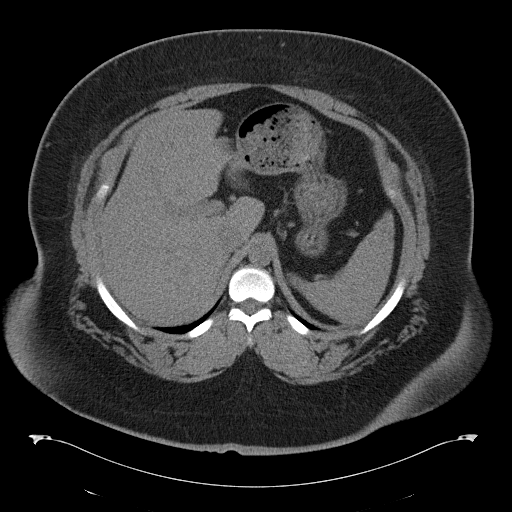
[im 82/95  soft-tissue]
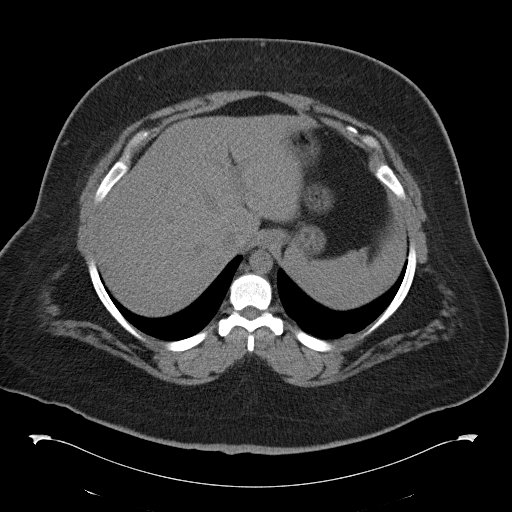
[im 82/95  lung]
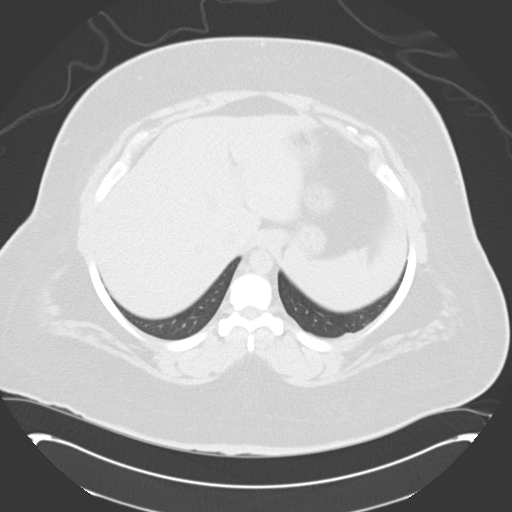
[im 85/95  lung]
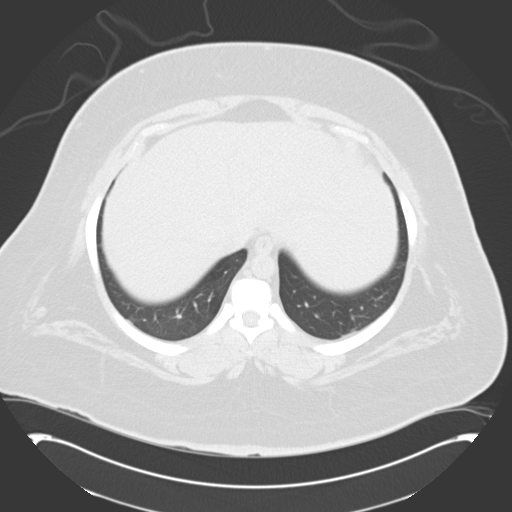
[im 88/95  soft-tissue]
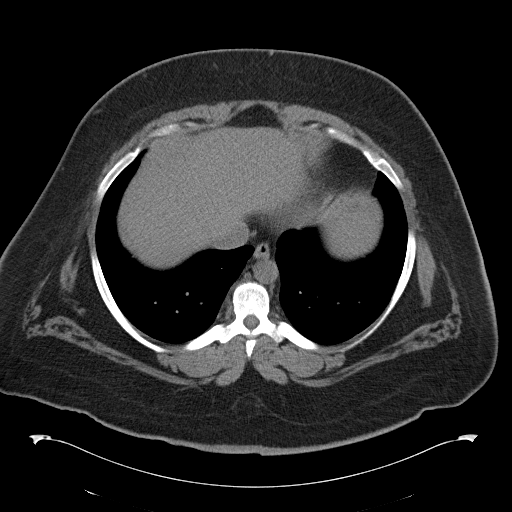
[im 88/95  lung]
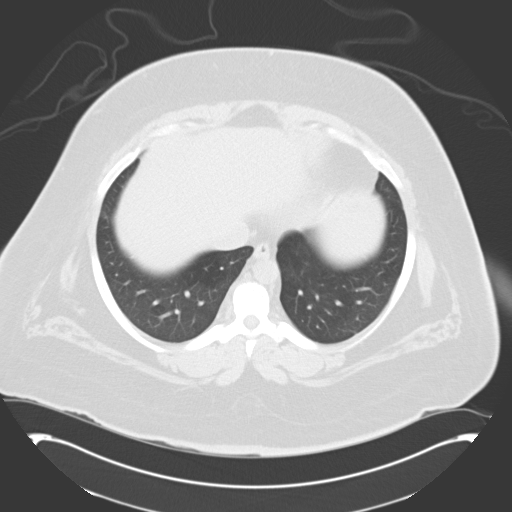
[im 91/95  lung]
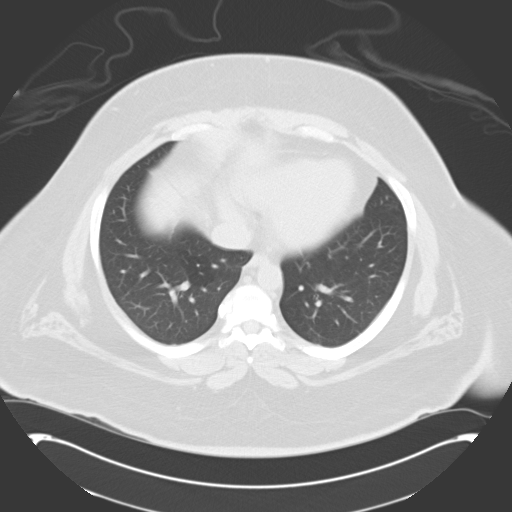

[16 of 32 positions shown; findings below may reference images not displayed]

FINDINGS: No renal calculi.  No hydronephrosis, hydroureter, or perinephric stranding.  Viscera remain normal, given the limitations of the scan technique.  No adenopathy or fluid collections.
IMPRESSION: 1.  No abnormality of the kidneys or upper urinary tract.
 2.  No other findings of significance in the unenhanced state.  
 PELVIS CT WITHOUT CONTRAST:
FINDINGS: The appendix contains contrast.  It is normal.  No ureteral stones or dilatation.  Bladder normal.  No masses or fluid collections.
IMPRESSION: No acute or significant findings.

## 2006-09-21 IMAGING — CR DG CHEST 2V
2 series · 2 of 2 positions shown · non-contrast
Comparison: Chest radiograph 01/10/05.

CLINICAL DATA: Pyelonephritis, shortness of breath and right lateral chest and back pain.  
 CHEST - 2 VIEW:

[w chest pa *]
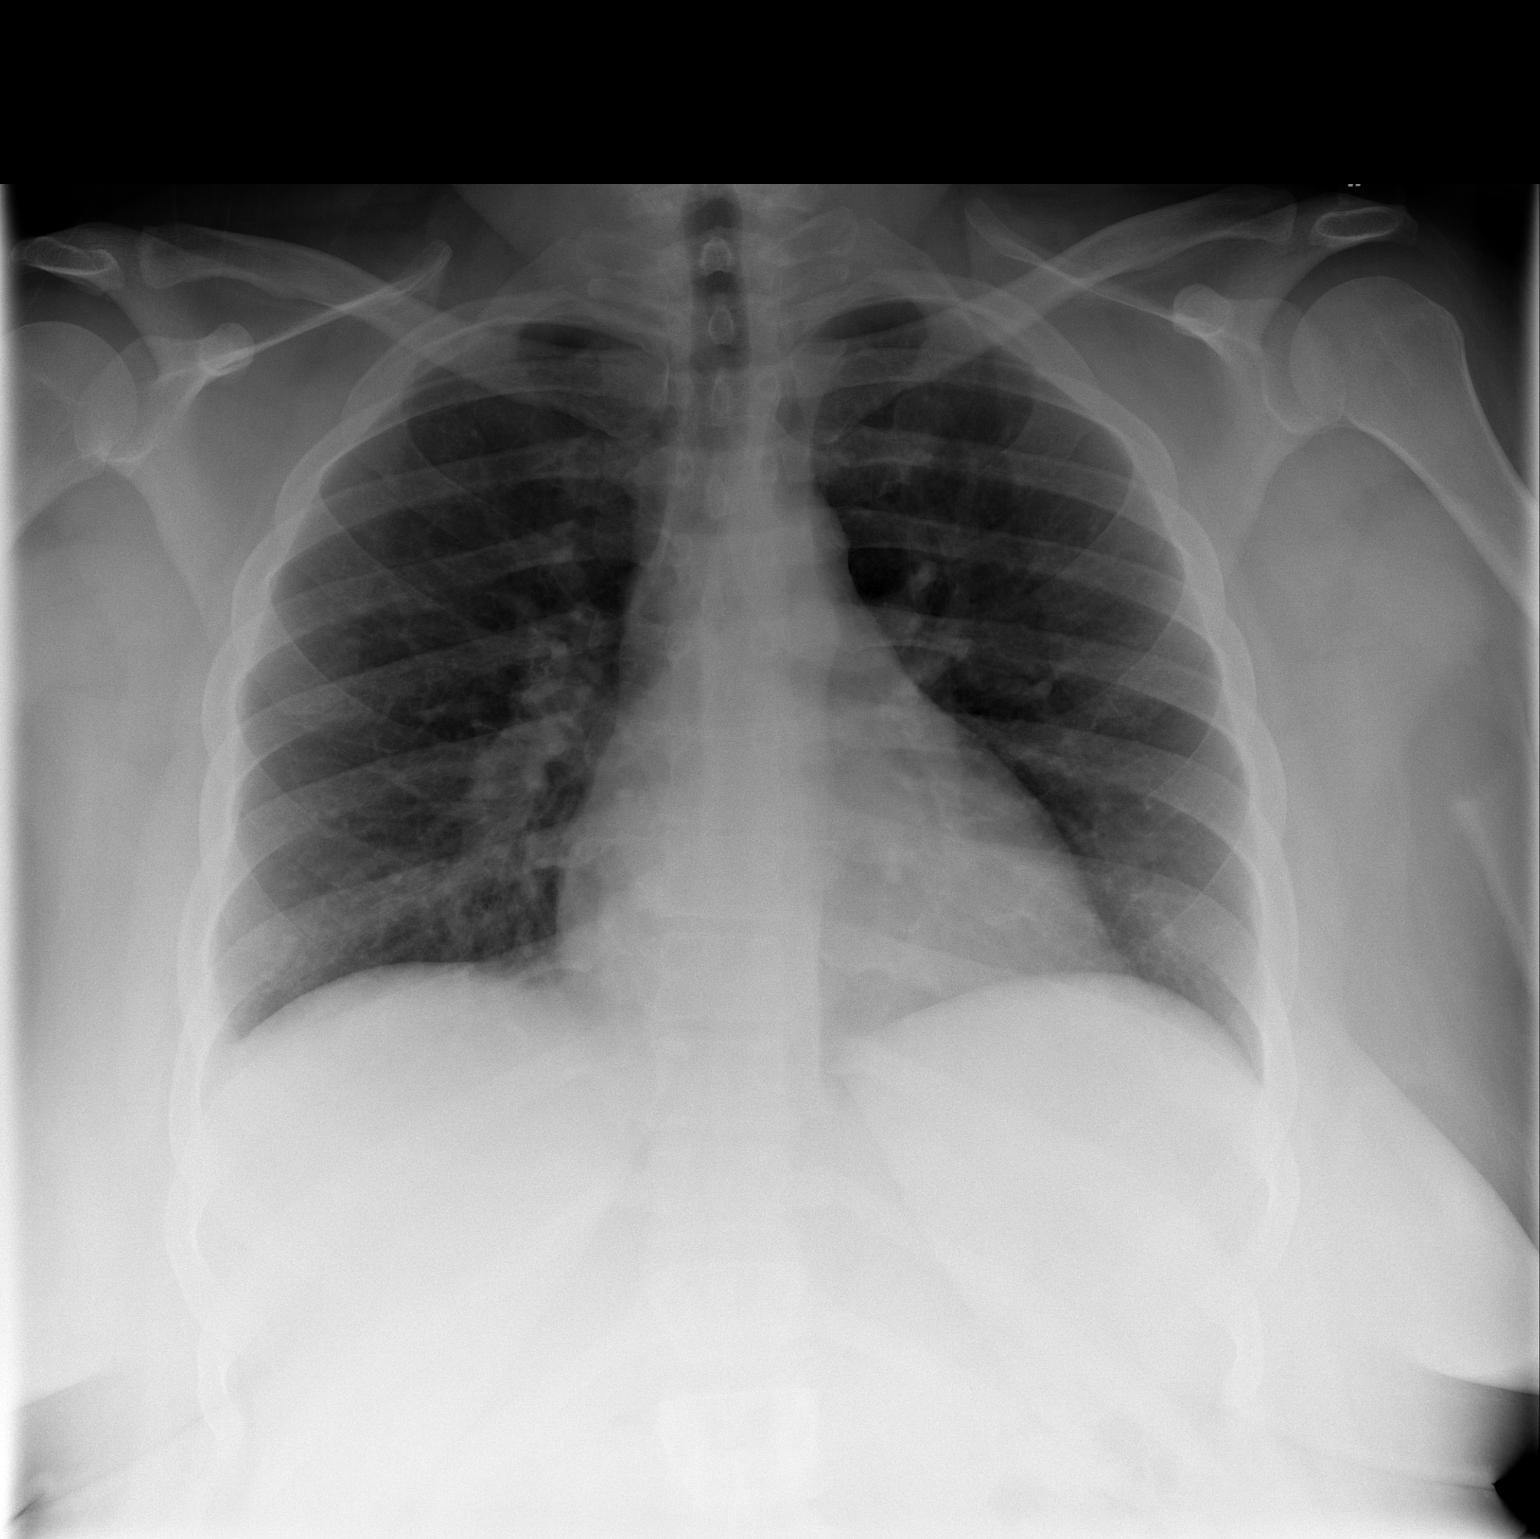

[w chest lat *]
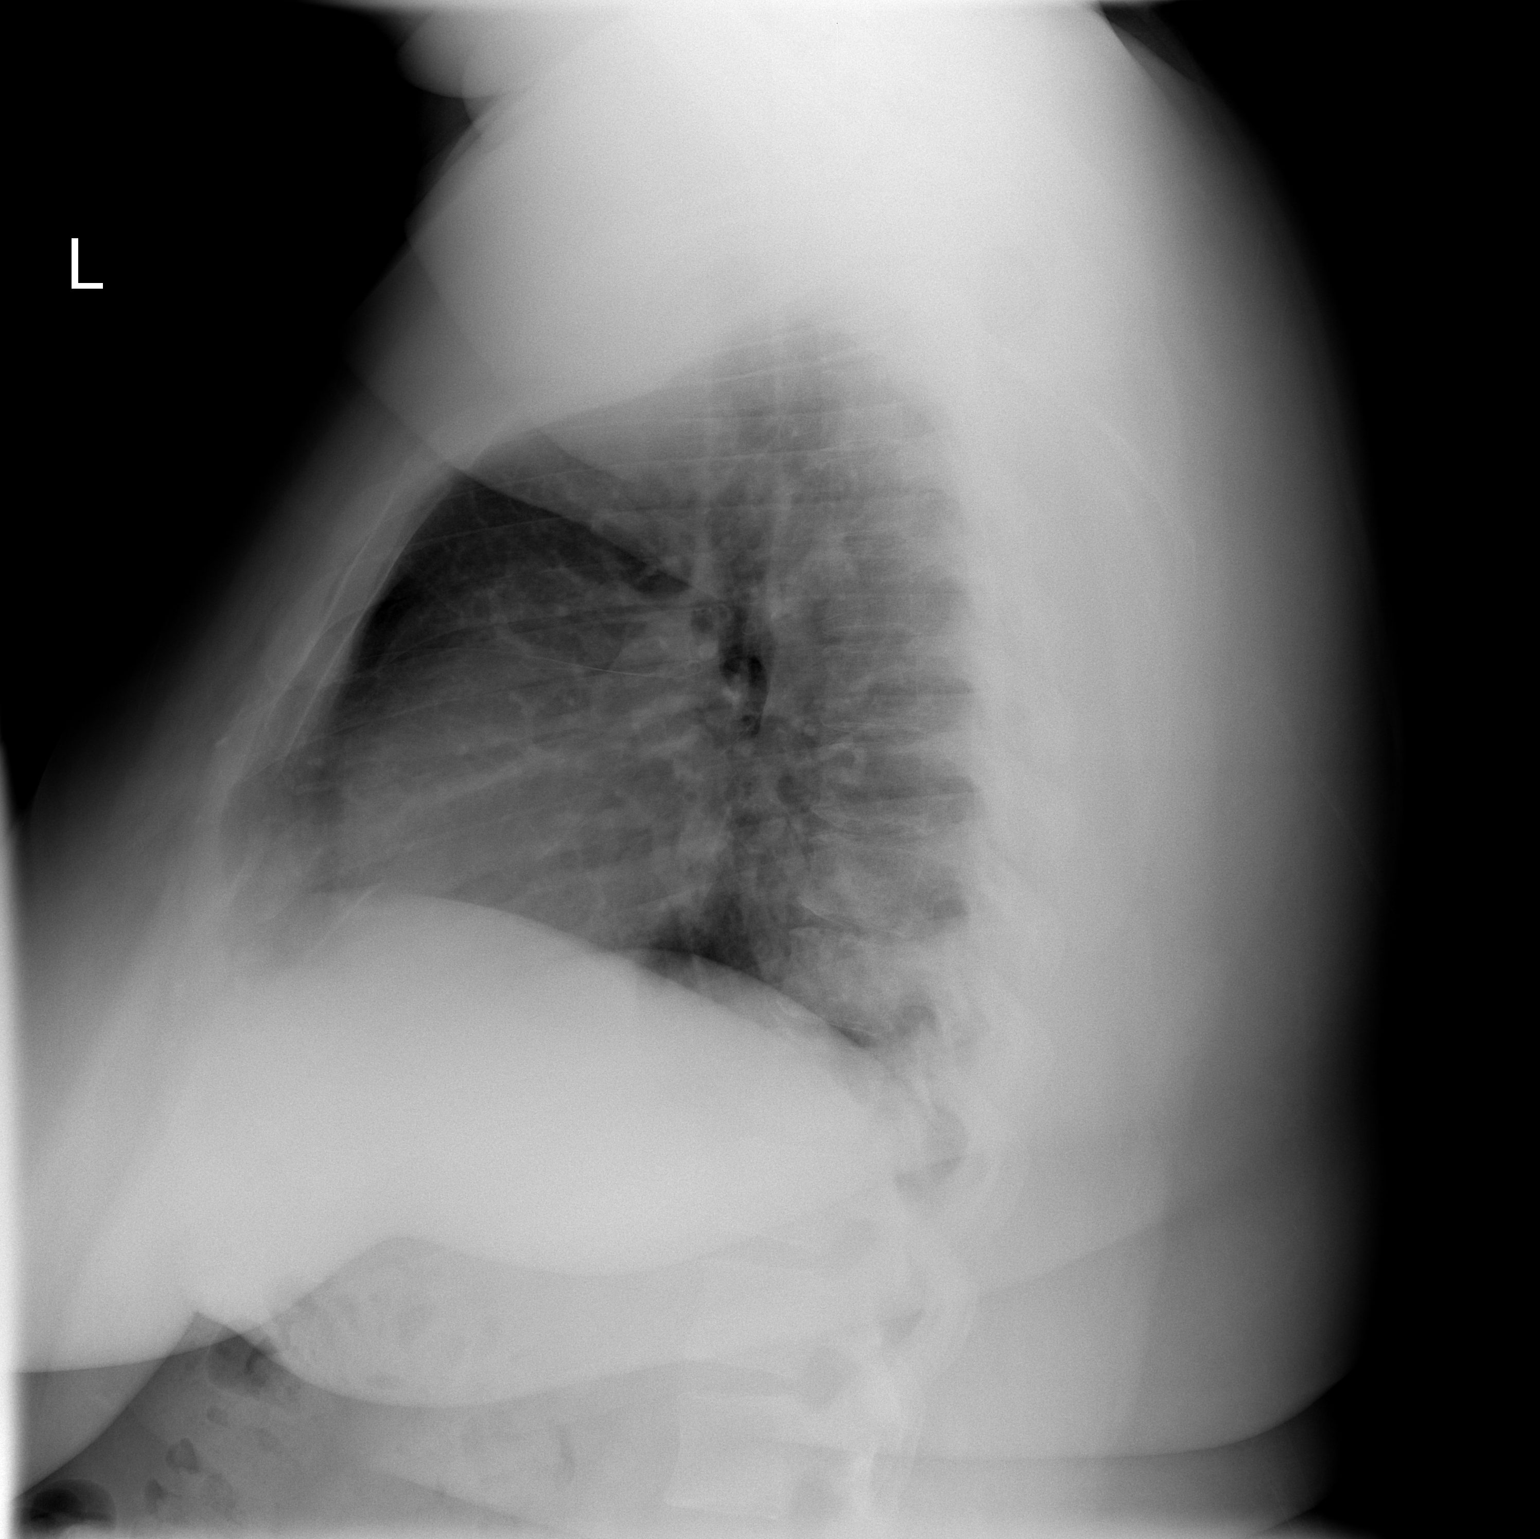

[2 of 2 positions shown; findings below may reference images not displayed]

The heart size and mediastinal contours are within normal limits.  Both lungs are clear.  The visualized skeletal structures are unremarkable.  The lung volumes are low.
IMPRESSION: No active cardiopulmonary disease.

## 2006-11-07 ENCOUNTER — Emergency Department (HOSPITAL_COMMUNITY): Admission: EM | Admit: 2006-11-07 | Discharge: 2006-11-07 | Payer: Self-pay | Admitting: Emergency Medicine

## 2006-12-09 ENCOUNTER — Emergency Department (HOSPITAL_COMMUNITY): Admission: EM | Admit: 2006-12-09 | Discharge: 2006-12-09 | Payer: Self-pay | Admitting: Emergency Medicine

## 2006-12-13 ENCOUNTER — Telehealth: Payer: Self-pay | Admitting: *Deleted

## 2007-01-03 ENCOUNTER — Ambulatory Visit: Payer: Self-pay | Admitting: Internal Medicine

## 2007-01-03 ENCOUNTER — Encounter (INDEPENDENT_AMBULATORY_CARE_PROVIDER_SITE_OTHER): Payer: Self-pay | Admitting: *Deleted

## 2007-01-03 ENCOUNTER — Ambulatory Visit (HOSPITAL_COMMUNITY): Admission: RE | Admit: 2007-01-03 | Discharge: 2007-01-03 | Payer: Self-pay | Admitting: Internal Medicine

## 2007-01-03 DIAGNOSIS — R079 Chest pain, unspecified: Secondary | ICD-10-CM | POA: Insufficient documentation

## 2007-01-03 LAB — CONVERTED CEMR LAB
Blood Glucose, Fingerstick: 175
Chloride: 105 meq/L (ref 96–112)
Creatinine, Ser: 0.85 mg/dL (ref 0.40–1.20)
Hgb A1c MFr Bld: 7.3 %

## 2007-01-17 ENCOUNTER — Ambulatory Visit: Payer: Self-pay | Admitting: Internal Medicine

## 2007-01-17 ENCOUNTER — Encounter (INDEPENDENT_AMBULATORY_CARE_PROVIDER_SITE_OTHER): Payer: Self-pay | Admitting: *Deleted

## 2007-01-17 DIAGNOSIS — G4733 Obstructive sleep apnea (adult) (pediatric): Secondary | ICD-10-CM | POA: Insufficient documentation

## 2007-02-04 ENCOUNTER — Encounter (INDEPENDENT_AMBULATORY_CARE_PROVIDER_SITE_OTHER): Payer: Self-pay | Admitting: *Deleted

## 2007-02-04 ENCOUNTER — Ambulatory Visit (HOSPITAL_BASED_OUTPATIENT_CLINIC_OR_DEPARTMENT_OTHER): Admission: RE | Admit: 2007-02-04 | Discharge: 2007-02-04 | Payer: Self-pay | Admitting: *Deleted

## 2007-02-10 ENCOUNTER — Ambulatory Visit: Payer: Self-pay | Admitting: Internal Medicine

## 2007-03-01 ENCOUNTER — Telehealth: Payer: Self-pay | Admitting: *Deleted

## 2007-03-13 ENCOUNTER — Emergency Department (HOSPITAL_COMMUNITY): Admission: EM | Admit: 2007-03-13 | Discharge: 2007-03-14 | Payer: Self-pay | Admitting: Emergency Medicine

## 2007-03-14 ENCOUNTER — Telehealth (INDEPENDENT_AMBULATORY_CARE_PROVIDER_SITE_OTHER): Payer: Self-pay | Admitting: *Deleted

## 2007-03-18 ENCOUNTER — Encounter (INDEPENDENT_AMBULATORY_CARE_PROVIDER_SITE_OTHER): Payer: Self-pay | Admitting: *Deleted

## 2007-03-25 ENCOUNTER — Telehealth: Payer: Self-pay | Admitting: *Deleted

## 2007-03-26 ENCOUNTER — Telehealth (INDEPENDENT_AMBULATORY_CARE_PROVIDER_SITE_OTHER): Payer: Self-pay | Admitting: *Deleted

## 2007-04-01 ENCOUNTER — Encounter (INDEPENDENT_AMBULATORY_CARE_PROVIDER_SITE_OTHER): Payer: Self-pay | Admitting: *Deleted

## 2007-04-01 ENCOUNTER — Ambulatory Visit: Payer: Self-pay | Admitting: Internal Medicine

## 2007-04-01 DIAGNOSIS — M549 Dorsalgia, unspecified: Secondary | ICD-10-CM | POA: Insufficient documentation

## 2007-04-01 LAB — CONVERTED CEMR LAB
Blood Glucose, Fingerstick: 447
Hgb A1c MFr Bld: 10.9 %

## 2007-04-03 LAB — CONVERTED CEMR LAB
Bilirubin Urine: NEGATIVE
Calcium: 9.5 mg/dL (ref 8.4–10.5)
Chloride: 98 meq/L (ref 96–112)
Creatinine, Ser: 1.06 mg/dL (ref 0.40–1.20)
Leukocytes, UA: NEGATIVE
Nitrite: NEGATIVE
Specific Gravity, Urine: 1.038 — ABNORMAL HIGH (ref 1.005–1.03)
Urobilinogen, UA: 1 (ref 0.0–1.0)

## 2007-05-07 ENCOUNTER — Encounter (INDEPENDENT_AMBULATORY_CARE_PROVIDER_SITE_OTHER): Payer: Self-pay | Admitting: *Deleted

## 2007-05-07 ENCOUNTER — Ambulatory Visit: Payer: Self-pay | Admitting: Internal Medicine

## 2007-05-08 ENCOUNTER — Inpatient Hospital Stay (HOSPITAL_COMMUNITY): Admission: EM | Admit: 2007-05-08 | Discharge: 2007-05-09 | Payer: Self-pay | Admitting: Emergency Medicine

## 2007-05-09 ENCOUNTER — Encounter (INDEPENDENT_AMBULATORY_CARE_PROVIDER_SITE_OTHER): Payer: Self-pay | Admitting: Internal Medicine

## 2007-05-09 ENCOUNTER — Encounter: Payer: Self-pay | Admitting: Internal Medicine

## 2007-05-18 ENCOUNTER — Emergency Department (HOSPITAL_COMMUNITY): Admission: EM | Admit: 2007-05-18 | Discharge: 2007-05-19 | Payer: Self-pay | Admitting: Emergency Medicine

## 2007-06-19 ENCOUNTER — Telehealth (INDEPENDENT_AMBULATORY_CARE_PROVIDER_SITE_OTHER): Payer: Self-pay | Admitting: *Deleted

## 2007-06-26 ENCOUNTER — Encounter (INDEPENDENT_AMBULATORY_CARE_PROVIDER_SITE_OTHER): Payer: Self-pay | Admitting: Internal Medicine

## 2007-06-26 ENCOUNTER — Ambulatory Visit (HOSPITAL_COMMUNITY): Admission: RE | Admit: 2007-06-26 | Discharge: 2007-06-26 | Payer: Self-pay | Admitting: Internal Medicine

## 2007-06-26 ENCOUNTER — Ambulatory Visit: Payer: Self-pay | Admitting: Hospitalist

## 2007-06-26 ENCOUNTER — Encounter (INDEPENDENT_AMBULATORY_CARE_PROVIDER_SITE_OTHER): Payer: Self-pay | Admitting: *Deleted

## 2007-06-27 LAB — CONVERTED CEMR LAB
ALT: <8 units/L (ref 0–35)
AST: 19 units/L (ref 0–37)
BUN: 9 mg/dL (ref 6–23)
Basophils Absolute: 0.1 10*3/uL (ref 0.0–0.1)
Basophils Relative: 0 % (ref 0–1)
Creatinine, Ser: 0.87 mg/dL (ref 0.40–1.20)
Eosinophils Relative: 3 % (ref 0–5)
HCT: 34.3 % — ABNORMAL LOW (ref 36.0–46.0)
Hemoglobin: 10.9 g/dL — ABNORMAL LOW (ref 12.0–15.0)
MCHC: 31.8 g/dL (ref 30.0–36.0)
MCV: 64.6 fL — ABNORMAL LOW (ref 78.0–100.0)
Monocytes Absolute: 1.2 10*3/uL — ABNORMAL HIGH (ref 0.1–1.0)
Monocytes Relative: 9 % (ref 3–12)
RDW: 19.2 % — ABNORMAL HIGH (ref 11.5–15.5)
Total Bilirubin: 0.4 mg/dL (ref 0.3–1.2)

## 2007-07-08 ENCOUNTER — Ambulatory Visit: Payer: Self-pay | Admitting: Infectious Diseases

## 2007-07-08 ENCOUNTER — Encounter (INDEPENDENT_AMBULATORY_CARE_PROVIDER_SITE_OTHER): Payer: Self-pay | Admitting: *Deleted

## 2007-07-09 ENCOUNTER — Encounter: Payer: Self-pay | Admitting: *Deleted

## 2007-07-09 ENCOUNTER — Telehealth: Payer: Self-pay | Admitting: *Deleted

## 2007-07-09 LAB — CONVERTED CEMR LAB
CO2: 20 meq/L (ref 19–32)
Calcium: 10 mg/dL (ref 8.4–10.5)
Chloride: 97 meq/L (ref 96–112)
Creatinine, Ser: 1.06 mg/dL (ref 0.40–1.20)
Glucose, Bld: 640 mg/dL (ref 70–99)
Sodium: 132 meq/L — ABNORMAL LOW (ref 135–145)

## 2007-07-10 ENCOUNTER — Encounter (INDEPENDENT_AMBULATORY_CARE_PROVIDER_SITE_OTHER): Payer: Self-pay | Admitting: *Deleted

## 2007-07-15 ENCOUNTER — Encounter (INDEPENDENT_AMBULATORY_CARE_PROVIDER_SITE_OTHER): Payer: Self-pay | Admitting: *Deleted

## 2007-07-15 ENCOUNTER — Ambulatory Visit: Payer: Self-pay | Admitting: *Deleted

## 2007-07-15 ENCOUNTER — Telehealth (INDEPENDENT_AMBULATORY_CARE_PROVIDER_SITE_OTHER): Payer: Self-pay | Admitting: *Deleted

## 2007-07-17 LAB — CONVERTED CEMR LAB
Calcium: 8.9 mg/dL (ref 8.4–10.5)
Sodium: 131 meq/L — ABNORMAL LOW (ref 135–145)

## 2007-07-19 ENCOUNTER — Telehealth (INDEPENDENT_AMBULATORY_CARE_PROVIDER_SITE_OTHER): Payer: Self-pay | Admitting: *Deleted

## 2007-08-08 ENCOUNTER — Ambulatory Visit: Payer: Self-pay | Admitting: Infectious Disease

## 2007-08-08 ENCOUNTER — Encounter (INDEPENDENT_AMBULATORY_CARE_PROVIDER_SITE_OTHER): Payer: Self-pay | Admitting: *Deleted

## 2007-08-08 LAB — CONVERTED CEMR LAB
Anti Nuclear Antibody(ANA): NEGATIVE
CRP: 2.7 mg/dL — ABNORMAL HIGH (ref <0.6)
Calcium: 9.1 mg/dL (ref 8.4–10.5)
Chlamydia, DNA Probe: NEGATIVE
Glucose, Bld: 488 mg/dL — ABNORMAL HIGH (ref 70–99)
Hemoglobin, Urine: NEGATIVE
Leukocytes, UA: NEGATIVE
MCHC: 31.3 g/dL (ref 30.0–36.0)
Protein, ur: NEGATIVE mg/dL
RDW: 18.3 % — ABNORMAL HIGH (ref 11.5–15.5)
Sed Rate: 45 mm/hr — ABNORMAL HIGH (ref 0–22)
Sodium: 136 meq/L (ref 135–145)
Urine Glucose: >1000 mg/dL — AB

## 2007-08-09 ENCOUNTER — Encounter (INDEPENDENT_AMBULATORY_CARE_PROVIDER_SITE_OTHER): Payer: Self-pay | Admitting: *Deleted

## 2007-08-09 LAB — CONVERTED CEMR LAB
Candida species: POSITIVE — AB
Gardnerella vaginalis: NEGATIVE
Trichomonal Vaginitis: NEGATIVE

## 2007-09-19 ENCOUNTER — Encounter (INDEPENDENT_AMBULATORY_CARE_PROVIDER_SITE_OTHER): Payer: Self-pay | Admitting: *Deleted

## 2007-10-11 ENCOUNTER — Inpatient Hospital Stay (HOSPITAL_COMMUNITY): Admission: AD | Admit: 2007-10-11 | Discharge: 2007-10-16 | Payer: Self-pay | Admitting: Infectious Diseases

## 2007-10-11 ENCOUNTER — Encounter: Payer: Self-pay | Admitting: Emergency Medicine

## 2007-10-18 ENCOUNTER — Encounter (INDEPENDENT_AMBULATORY_CARE_PROVIDER_SITE_OTHER): Payer: Self-pay | Admitting: Internal Medicine

## 2007-10-28 ENCOUNTER — Encounter (INDEPENDENT_AMBULATORY_CARE_PROVIDER_SITE_OTHER): Payer: Self-pay | Admitting: *Deleted

## 2007-10-30 ENCOUNTER — Ambulatory Visit: Payer: Self-pay | Admitting: *Deleted

## 2007-10-30 ENCOUNTER — Encounter (INDEPENDENT_AMBULATORY_CARE_PROVIDER_SITE_OTHER): Payer: Self-pay | Admitting: *Deleted

## 2007-10-30 DIAGNOSIS — IMO0001 Reserved for inherently not codable concepts without codable children: Secondary | ICD-10-CM

## 2007-10-30 LAB — CONVERTED CEMR LAB
BUN: 11 mg/dL (ref 6–23)
Chloride: 101 meq/L (ref 96–112)
Potassium: 4.3 meq/L (ref 3.5–5.3)
Sodium: 136 meq/L (ref 135–145)

## 2007-11-18 ENCOUNTER — Emergency Department (HOSPITAL_COMMUNITY): Admission: EM | Admit: 2007-11-18 | Discharge: 2007-11-19 | Payer: Self-pay | Admitting: Emergency Medicine

## 2007-11-21 ENCOUNTER — Ambulatory Visit: Payer: Self-pay | Admitting: Internal Medicine

## 2007-11-21 ENCOUNTER — Encounter: Payer: Self-pay | Admitting: Internal Medicine

## 2007-11-21 DIAGNOSIS — R1032 Left lower quadrant pain: Secondary | ICD-10-CM

## 2007-11-21 LAB — CONVERTED CEMR LAB
Basophils Absolute: 0 10*3/uL (ref 0.0–0.1)
Basophils Relative: 0 % (ref 0–1)
Bilirubin Urine: NEGATIVE
HCV Ab: NEGATIVE
Hep B S Ab: NEGATIVE
Hepatitis B Surface Ag: POSITIVE — AB
Hgb S Quant: 0 % (ref 0.0–0.0)
Lymphocytes Relative: 21 % (ref 12–46)
MCHC: 31.7 g/dL (ref 30.0–36.0)
Neutro Abs: 9.7 10*3/uL — ABNORMAL HIGH (ref 1.7–7.7)
Nitrite: NEGATIVE
Platelets: 520 10*3/uL — ABNORMAL HIGH (ref 150–400)
RBC / HPF: NONE SEEN (ref <3)
RDW: 18.7 % — ABNORMAL HIGH (ref 11.5–15.5)
Specific Gravity, Urine: 1.031 — ABNORMAL HIGH (ref 1.005–1.03)
Urobilinogen, UA: 1 (ref 0.0–1.0)
pH: 6 (ref 5.0–8.0)

## 2007-11-28 ENCOUNTER — Ambulatory Visit: Payer: Self-pay | Admitting: *Deleted

## 2007-11-28 ENCOUNTER — Encounter: Payer: Self-pay | Admitting: Internal Medicine

## 2007-11-28 LAB — CONVERTED CEMR LAB
Albumin: 3.8 g/dL (ref 3.5–5.2)
CO2: 22 meq/L (ref 19–32)
CRP: 1.3 mg/dL — ABNORMAL HIGH (ref <0.6)
Calcium: 9.6 mg/dL (ref 8.4–10.5)
Chloride: 102 meq/L (ref 96–112)
Glucose, Bld: 178 mg/dL — ABNORMAL HIGH (ref 70–99)
Potassium: 4 meq/L (ref 3.5–5.3)
Sodium: 138 meq/L (ref 135–145)
Total Protein: 8 g/dL (ref 6.0–8.3)

## 2007-11-29 ENCOUNTER — Encounter: Payer: Self-pay | Admitting: Internal Medicine

## 2007-12-11 ENCOUNTER — Inpatient Hospital Stay (HOSPITAL_COMMUNITY): Admission: AD | Admit: 2007-12-11 | Discharge: 2007-12-12 | Payer: Self-pay | Admitting: Infectious Diseases

## 2007-12-11 ENCOUNTER — Ambulatory Visit: Payer: Self-pay | Admitting: Infectious Diseases

## 2007-12-11 ENCOUNTER — Encounter (INDEPENDENT_AMBULATORY_CARE_PROVIDER_SITE_OTHER): Payer: Self-pay | Admitting: *Deleted

## 2007-12-11 ENCOUNTER — Ambulatory Visit: Payer: Self-pay | Admitting: Cardiovascular Disease

## 2007-12-11 ENCOUNTER — Ambulatory Visit: Payer: Self-pay | Admitting: Internal Medicine

## 2007-12-27 ENCOUNTER — Ambulatory Visit: Payer: Self-pay | Admitting: Internal Medicine

## 2007-12-27 DIAGNOSIS — F329 Major depressive disorder, single episode, unspecified: Secondary | ICD-10-CM

## 2007-12-27 LAB — CONVERTED CEMR LAB: Blood Glucose, Fingerstick: 408

## 2008-01-10 IMAGING — CR DG CHEST 2V
2 series · 2 of 2 positions shown · non-contrast
Comparison: none

CLINICAL DATA: Dyspnea. Wheezing. 
 CHEST - 2 VIEW:

[view not recorded (1 of 2)]
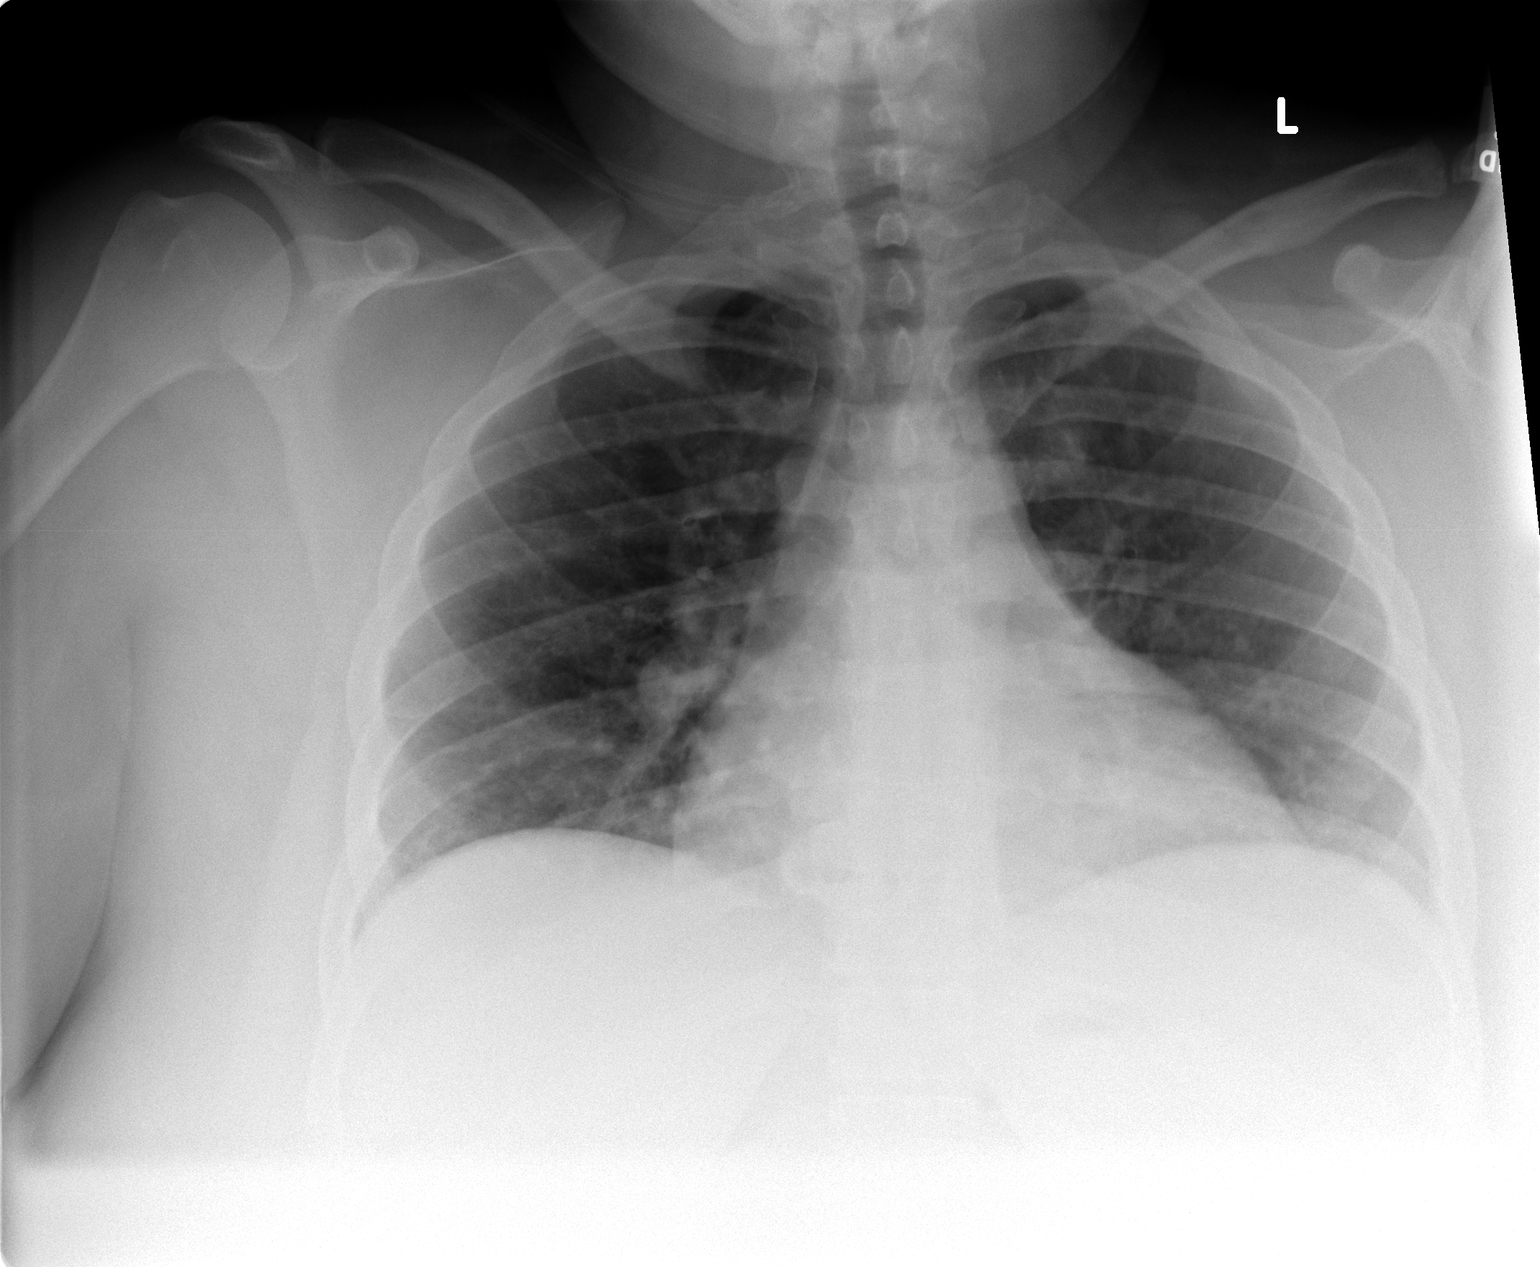

[view not recorded (2 of 2)]
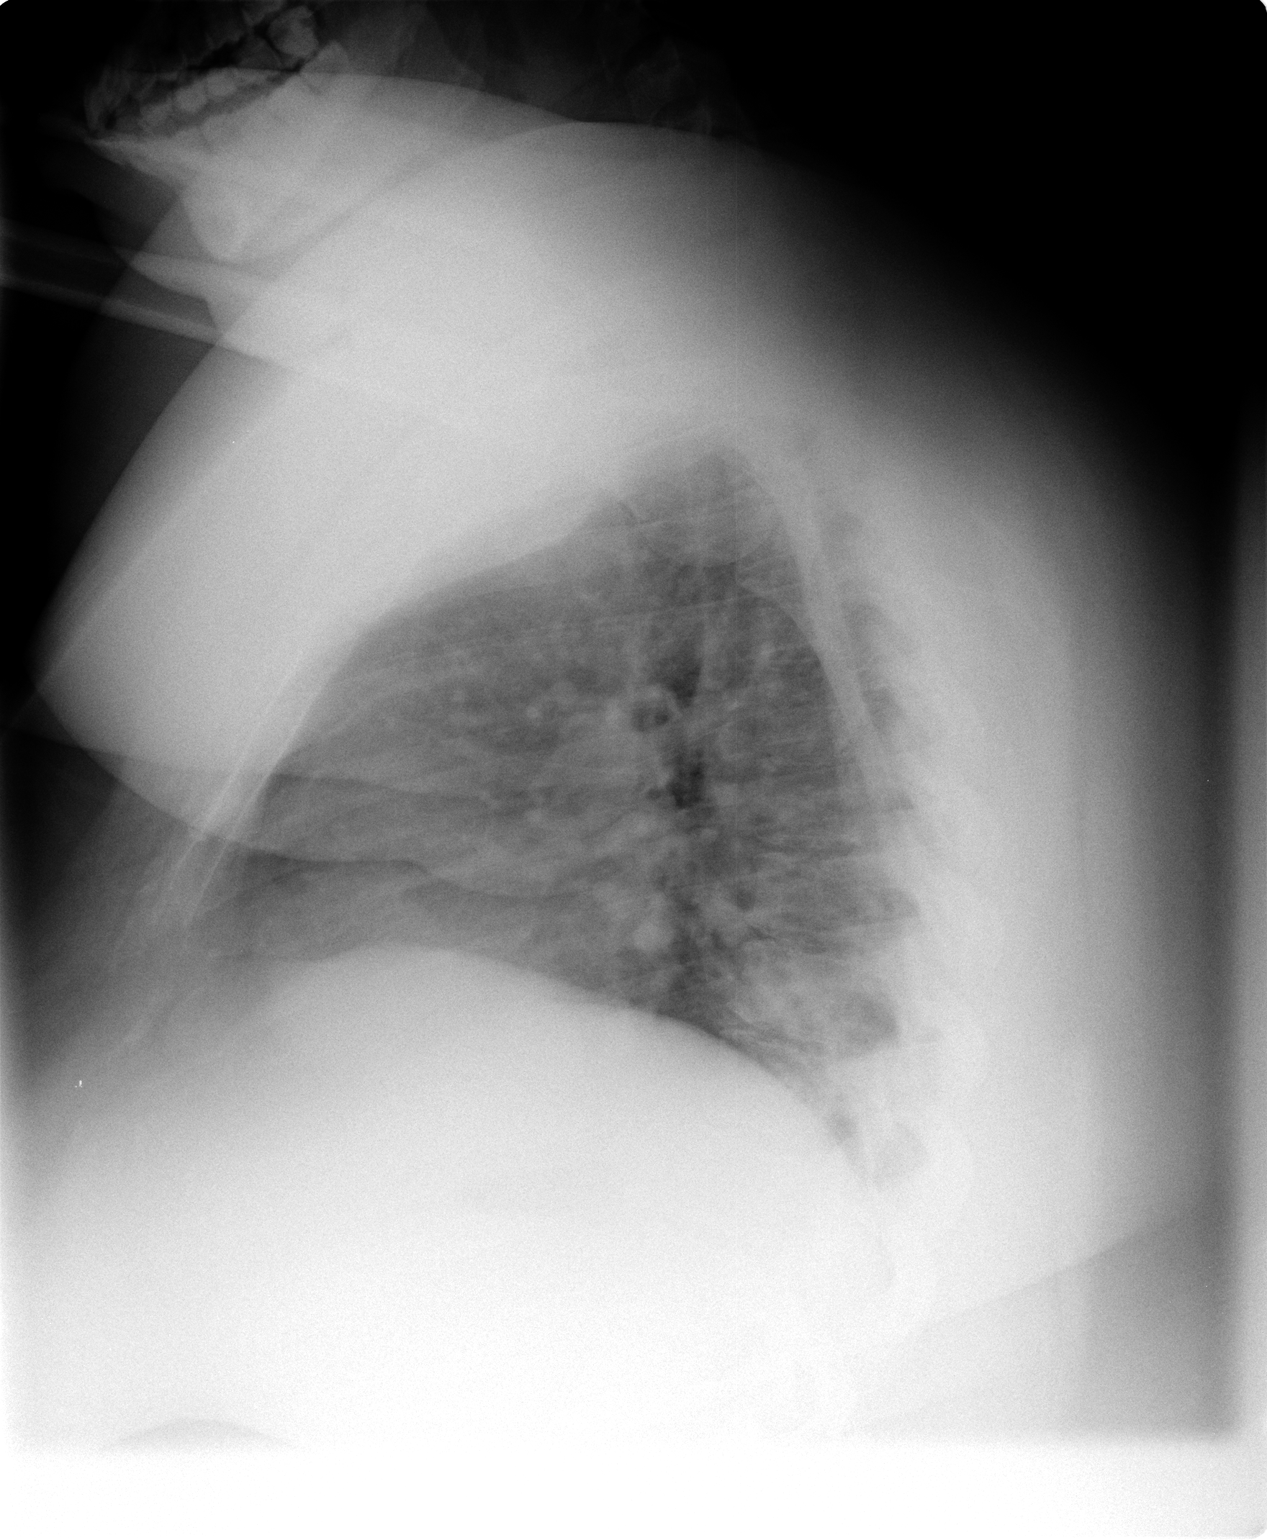

[2 of 2 positions shown; findings below may reference images not displayed]

FINDINGS: This is a low volume film. The cardiomediastinal silhouette is stable. Diffuse peribronchial thickening is noted with mild basilar atelectasis. No definite focal airspace disease. 
 No pleural effusion is present.
IMPRESSION: 1.  Low volume film with some basilar atelectasis. 
 2.  Peribronchial thickening without focal airspace disease.

## 2008-01-14 IMAGING — CR DG CHEST 1V PORT
2 series · 2 of 2 positions shown · non-contrast
Comparison: 05/06/06.

CLINICAL DATA: Chest pain/asthma.  
 PORTABLE CHEST ? 1 VIEW:

[view not recorded (1 of 2)]
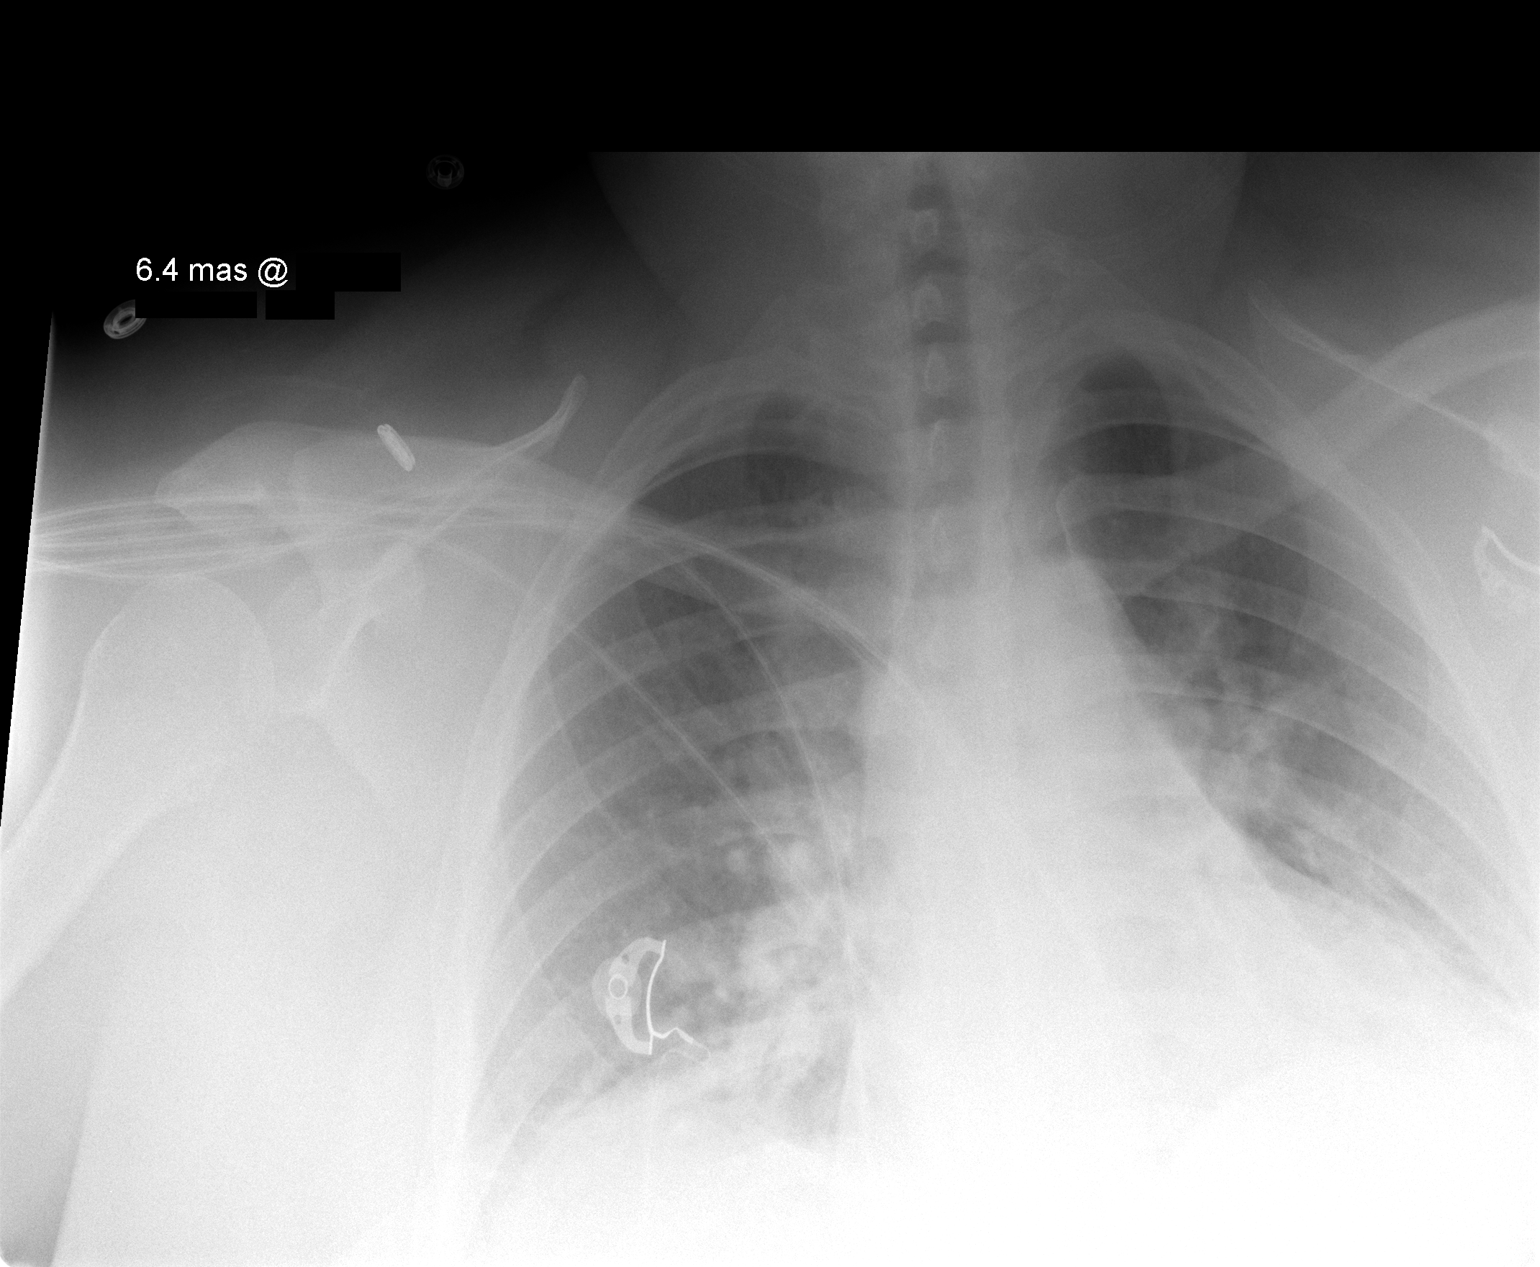

[view not recorded (2 of 2)]
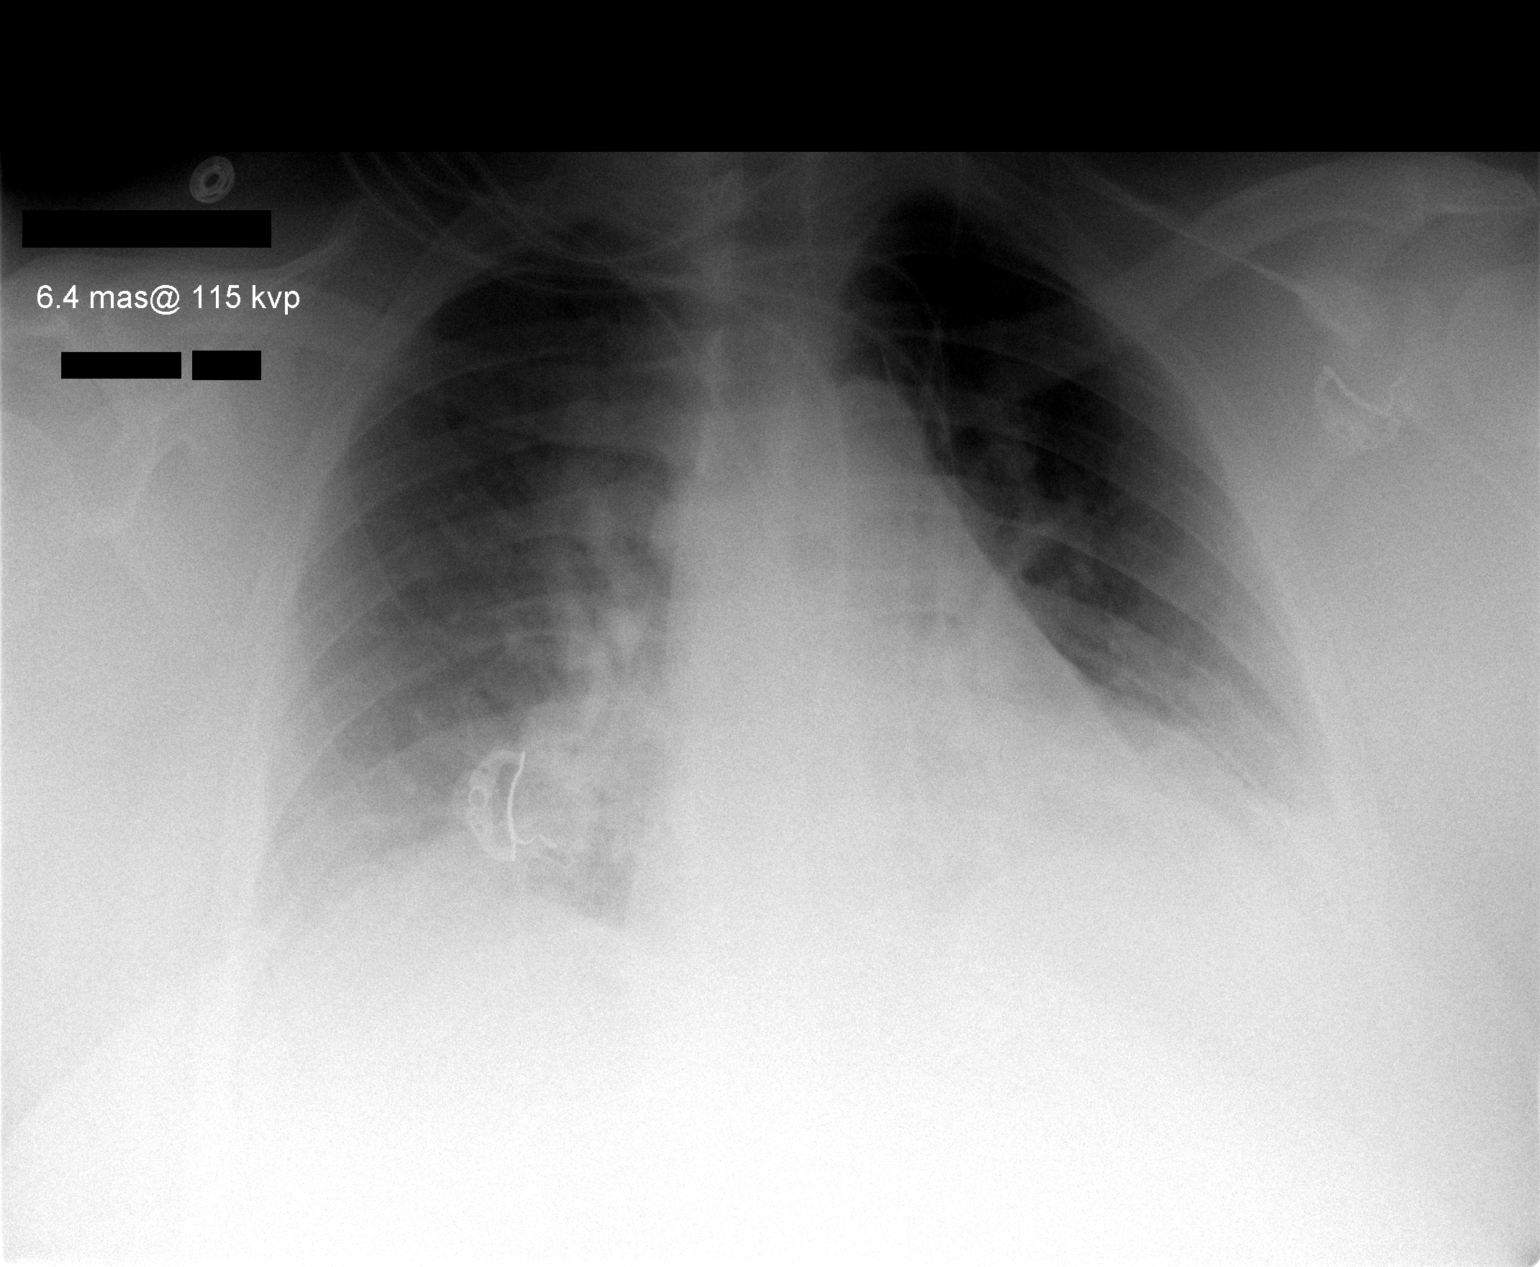

[2 of 2 positions shown; findings below may reference images not displayed]

FINDINGS: There is breathing motion artifact.  However, I believe there is new bilateral lower lobe air space disease consistent with atelectasis or pneumonia.  No definite heart failure.
IMPRESSION: New bilateral lower lobe atelectasis or pneumonia.

## 2008-01-16 ENCOUNTER — Telehealth: Payer: Self-pay | Admitting: Internal Medicine

## 2008-02-10 IMAGING — CR DG CHEST 2V
2 series · 2 of 2 positions shown · non-contrast
Comparison: 05/10/06.

CLINICAL DATA: Hyperglycemia.  Admitted two weeks ago for V-tach. Patient is now having similar symptoms.
 CHEST - 2 VIEW:

[view not recorded (1 of 2)]
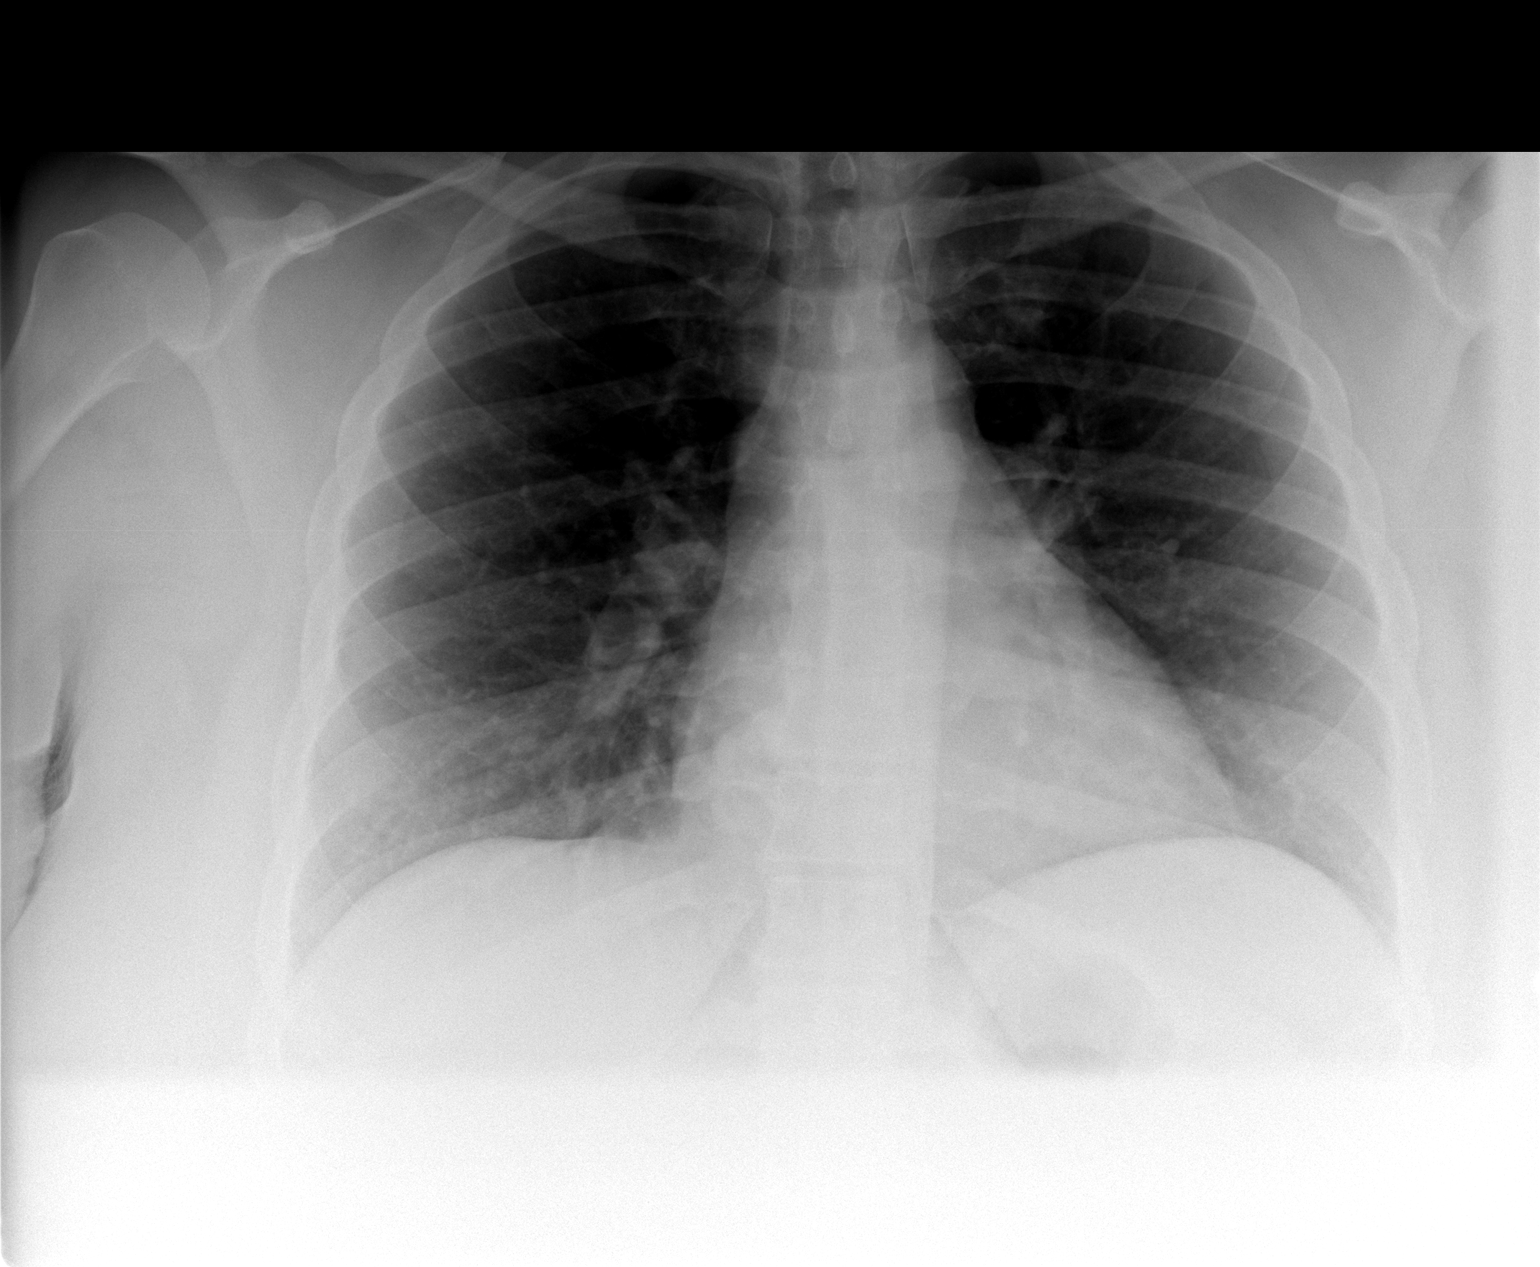

[view not recorded (2 of 2)]
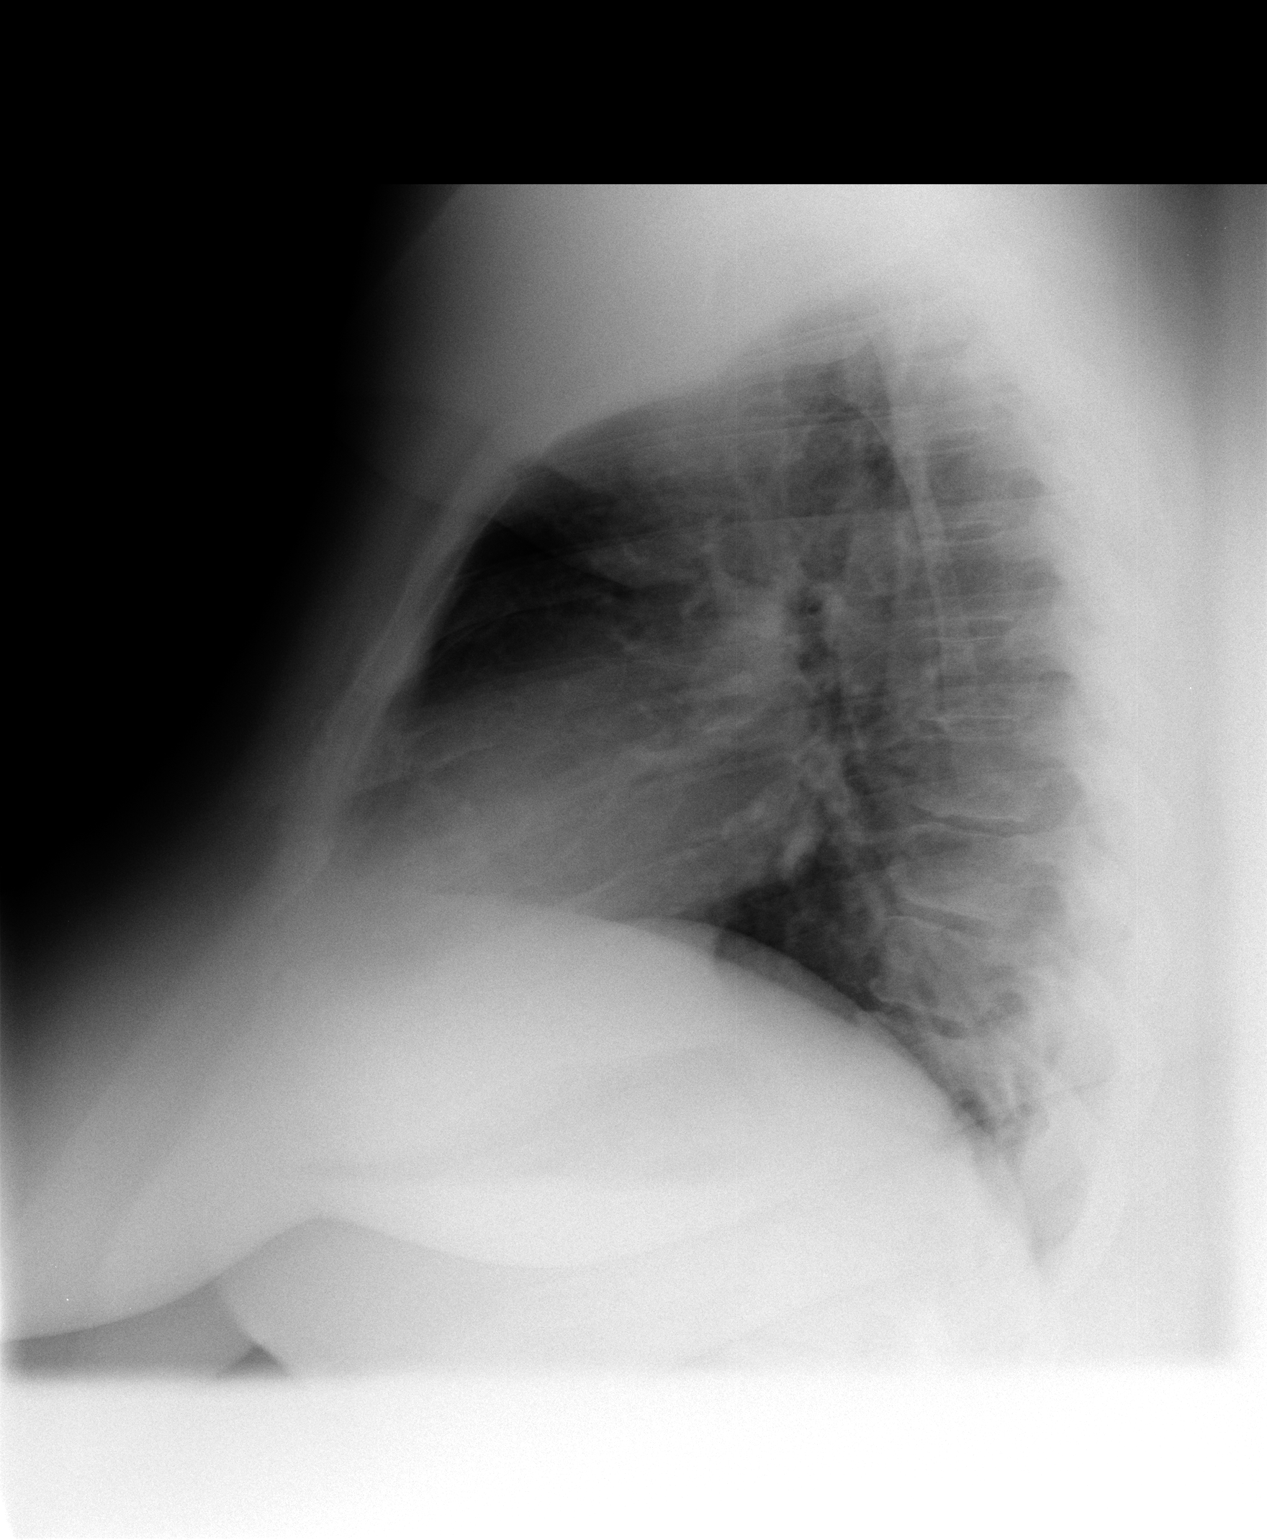

[2 of 2 positions shown; findings below may reference images not displayed]

FINDINGS: Heart size normal. No effusions or edema. No airspace opacities are identified.
IMPRESSION: No active disease.
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
## 2008-02-15 ENCOUNTER — Inpatient Hospital Stay (HOSPITAL_COMMUNITY): Admission: AD | Admit: 2008-02-15 | Discharge: 2008-02-15 | Payer: Self-pay | Admitting: Obstetrics & Gynecology

## 2008-02-20 ENCOUNTER — Ambulatory Visit: Payer: Self-pay | Admitting: Internal Medicine

## 2008-02-20 IMAGING — CR DG CHEST 1V PORT
1 series · 1 of 1 positions shown · non-contrast
Comparison: Chest of 06/06/06.

CLINICAL DATA: MI follow-up.  
 PORTABLE CHEST - 1 VIEW - 06/16/06 AT 2614 HOURS:

[view not recorded]
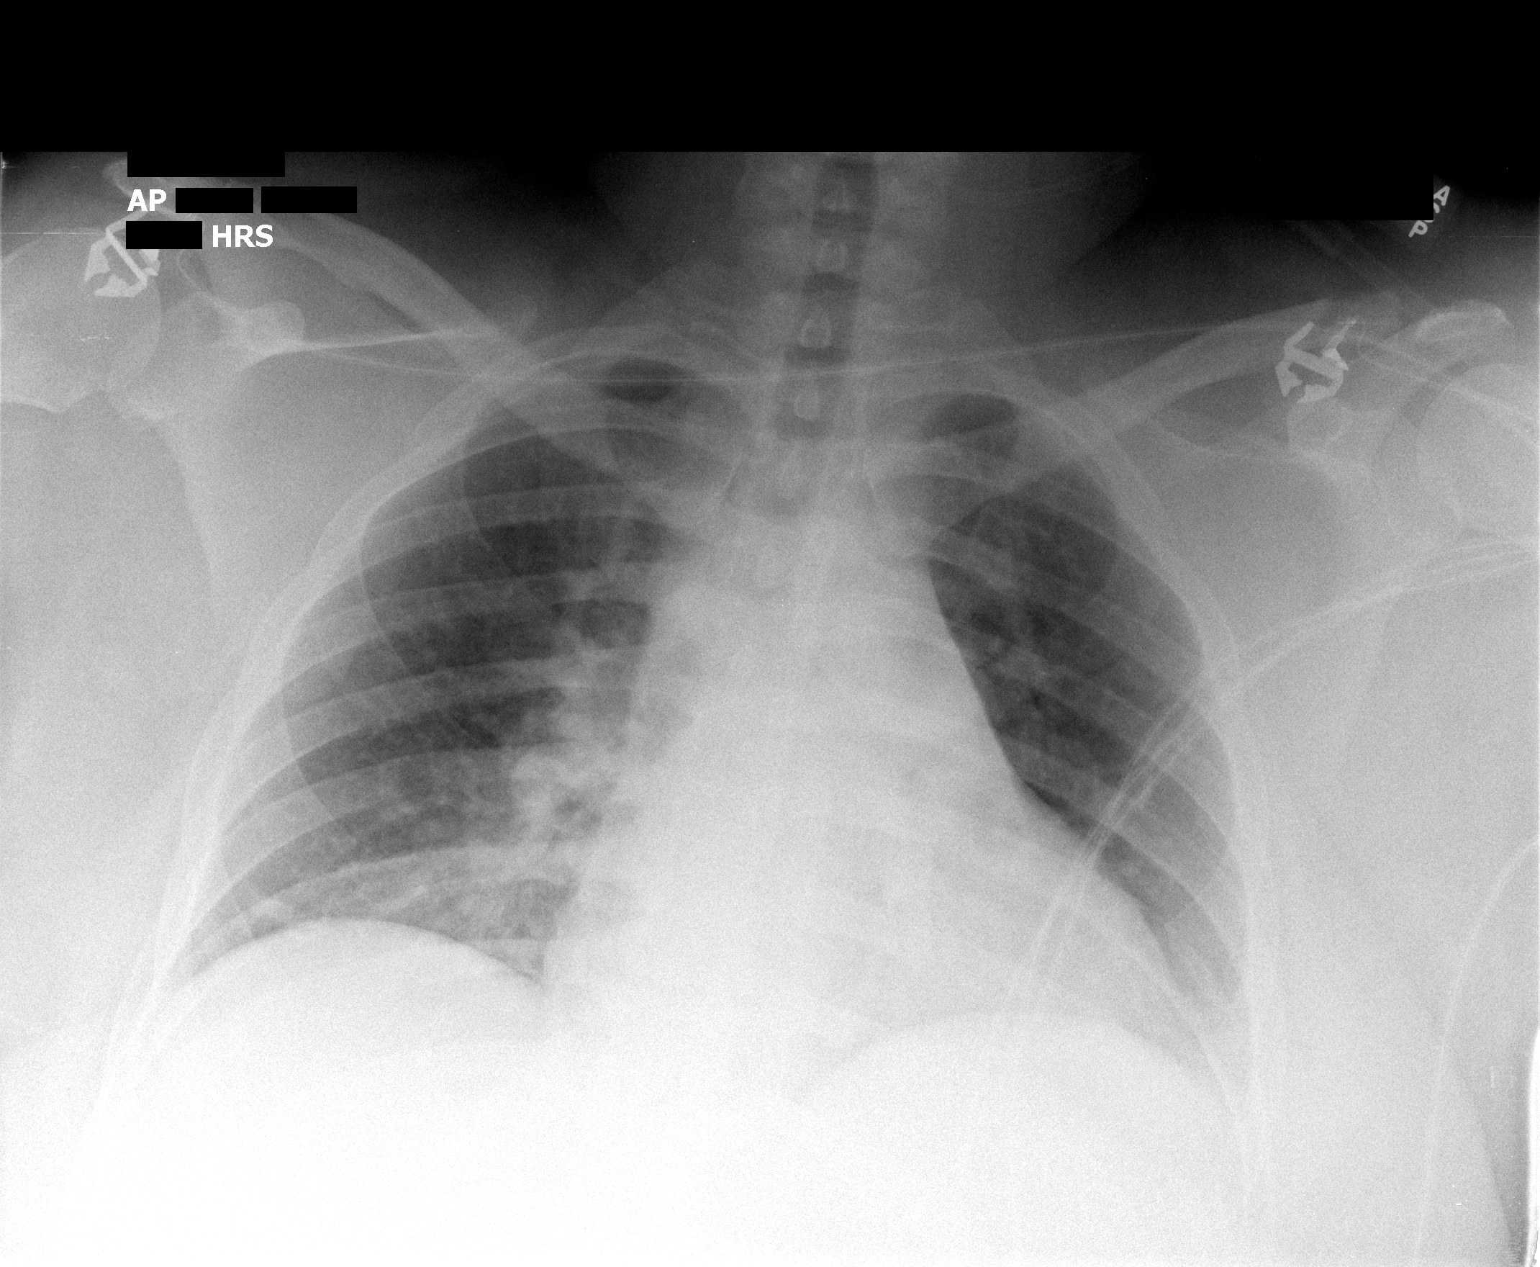

[1 of 1 positions shown; findings below may reference images not displayed]

FINDINGS: The lungs are not as well aerated with mild basilar atelectasis present.  The heart is within upper limits of normal.
IMPRESSION: Mild basilar atelectasis.

## 2008-02-21 IMAGING — CR DG FOOT COMPLETE 3+V*R*
3 series · 3 of 3 positions shown · non-contrast
Comparison: none

CLINICAL DATA: Pain in right foot.  No injury.
 RIGHT FOOT ? 3 VIEW ? 06/17/06:

[view not recorded (1 of 3)]
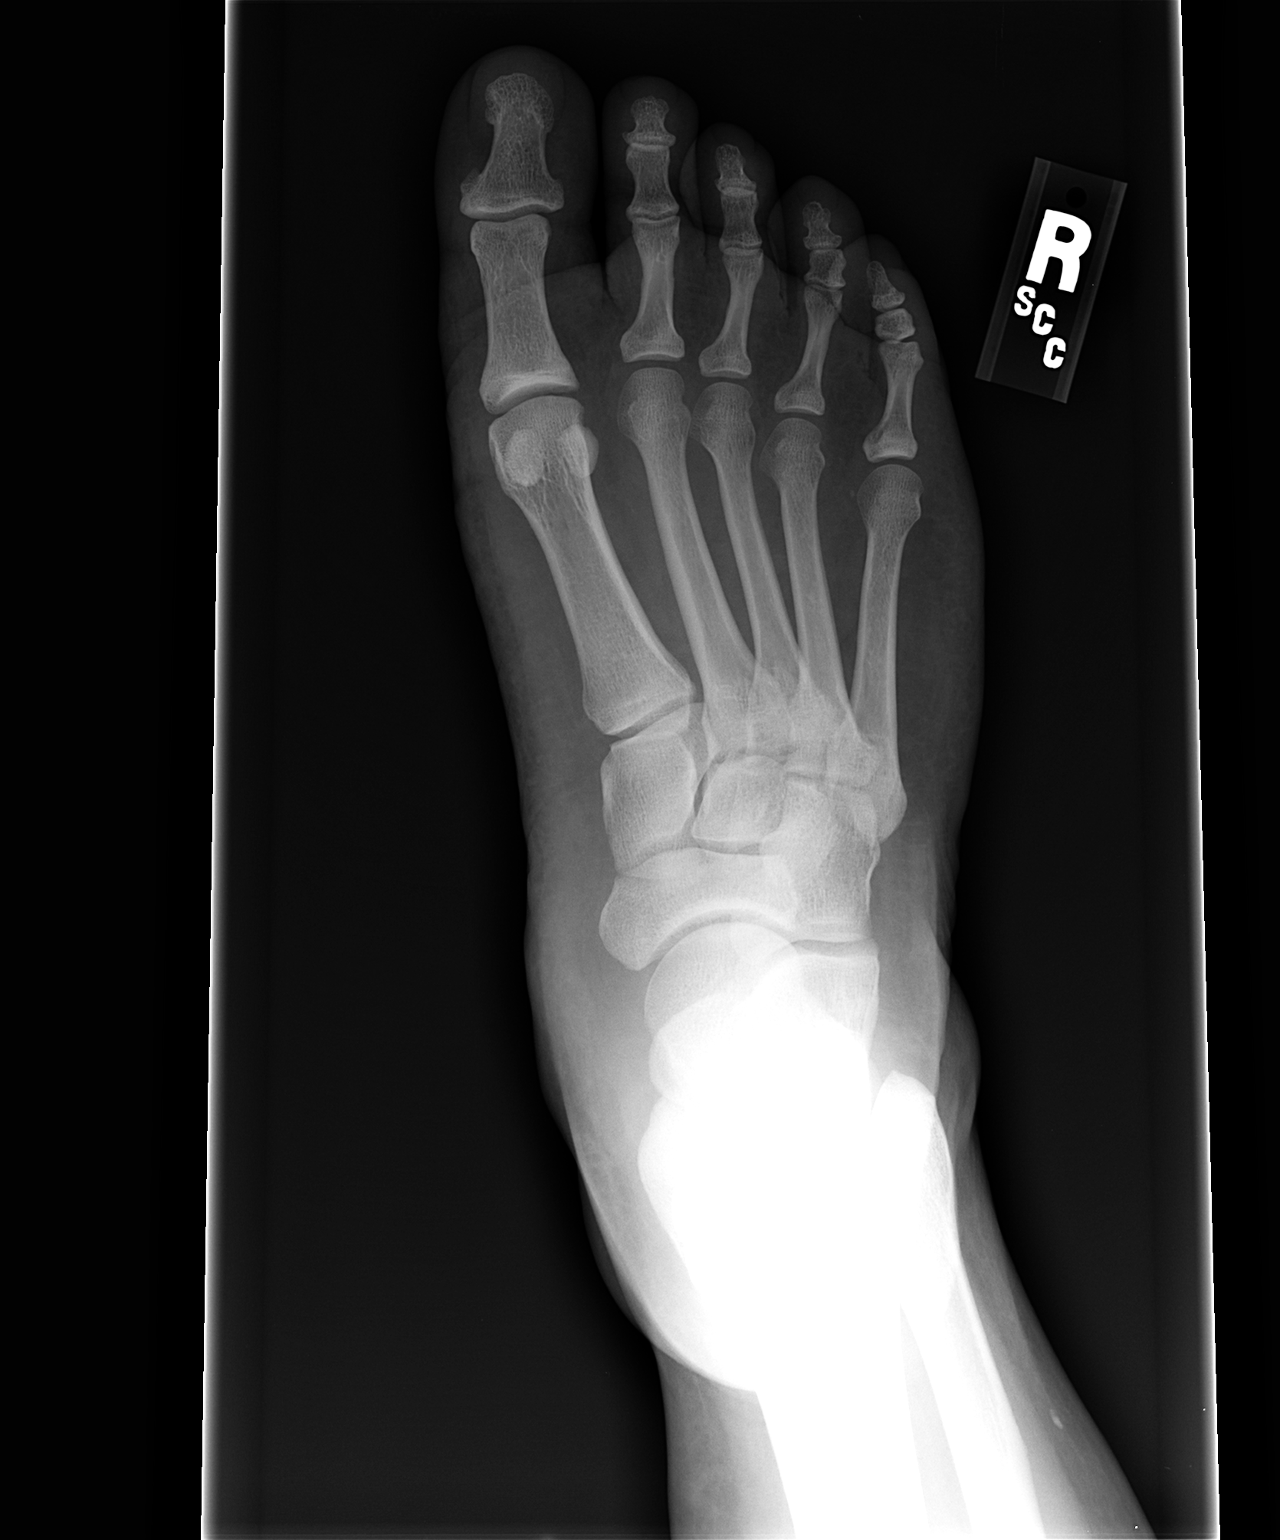

[view not recorded (2 of 3)]
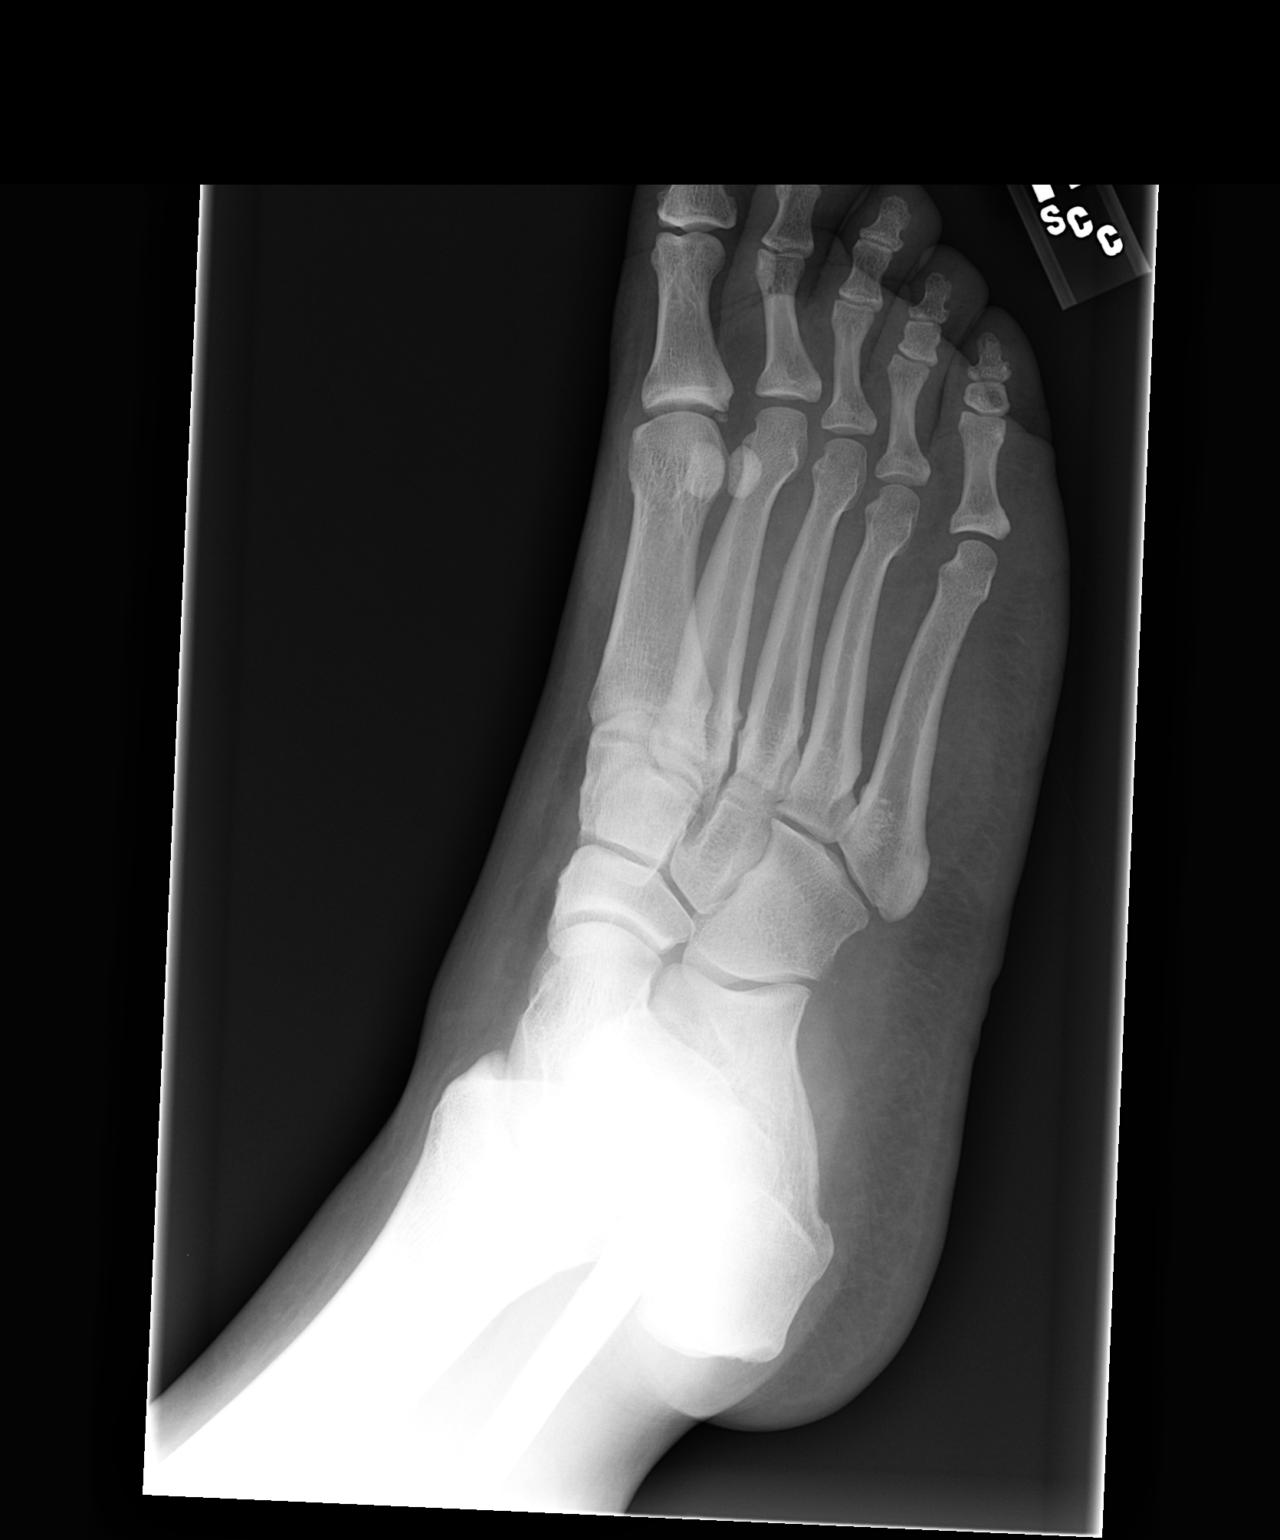

[view not recorded (3 of 3)]
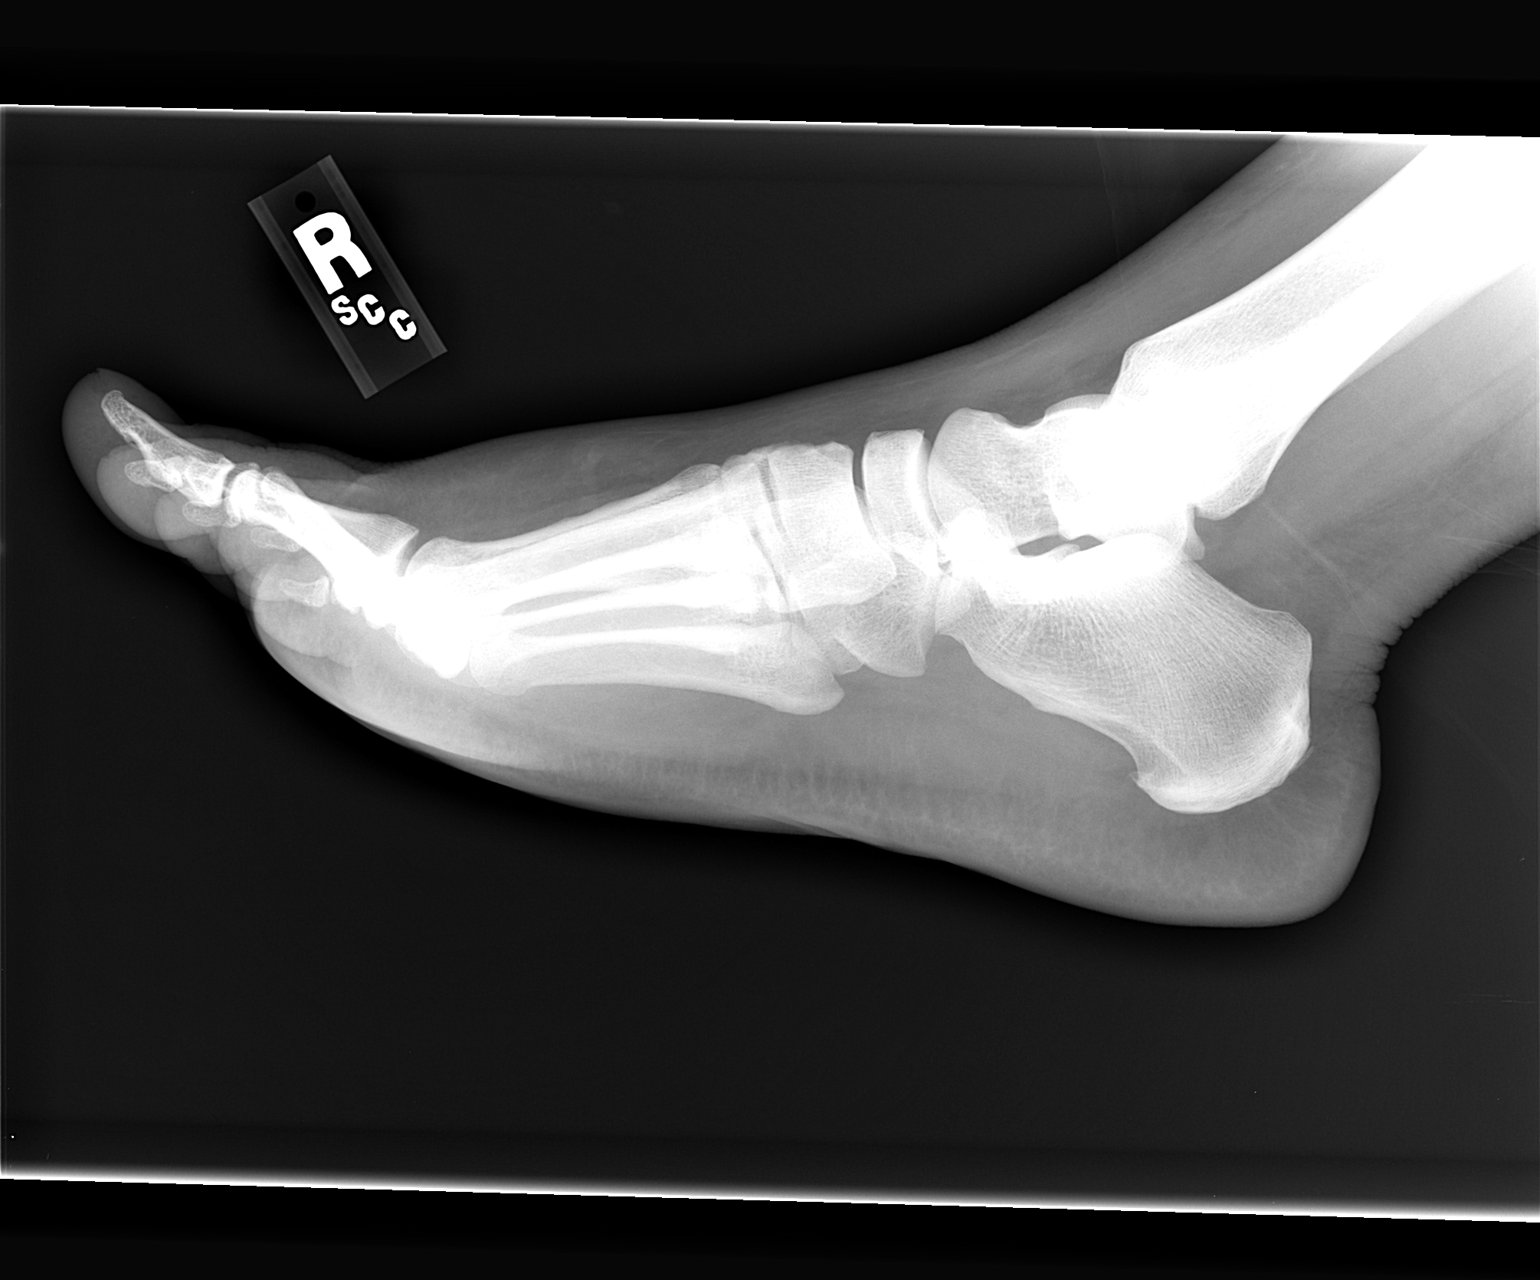

[3 of 3 positions shown; findings below may reference images not displayed]

FINDINGS: Three views of the right foot were obtained.   No acute fracture is seen.   Alignment is normal.
IMPRESSION: Negative right foot.

## 2008-03-16 ENCOUNTER — Telehealth: Payer: Self-pay | Admitting: *Deleted

## 2008-03-17 ENCOUNTER — Ambulatory Visit: Payer: Self-pay | Admitting: *Deleted

## 2008-03-21 IMAGING — NM NM PULM PERFUSION & VENT (REBREATHING & WASHOUT)
3 series · 18 of 18 positions shown · non-contrast
Comparison: Portable chest performed earlier today.

CLINICAL DATA: Chest pain and shortness of breath.  Clinical suspicion for pulmonary embolism.
 NUCLEAR MEDICINE VENTILATION - PERFUSION LUNG SCAN:
TECHNIQUE: Wash-in, equilibrium, and wash-out phase ventilation images were obtained using Fe-NHH gas.  Perfusion images were obtained in multiple projections after intravenous injection of 2c-IIm MAA.
 Radiopharmaceutical:  11.2 mCi Fe-NHH gas and 5.9 mCi 2c-IIm MAA.

[vq ventlung · 4.75mm/px · 6 of 8 frames shown (1 of 3)]
[frame 1/8]
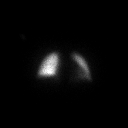
[frame 2/8]
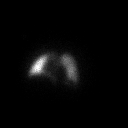
[frame 4/8]
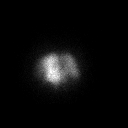
[frame 5/8]
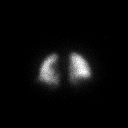
[frame 6/8]
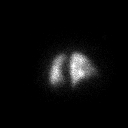
[frame 8/8]
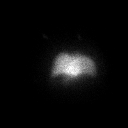

[vq ventlung · 4.75mm/px · 6 of 18 frames shown (2 of 3)]
[frame 2/18]
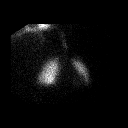
[frame 5/18]
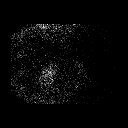
[frame 8/18]
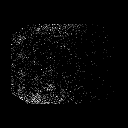
[frame 11/18]
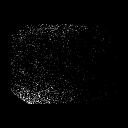
[frame 14/18]
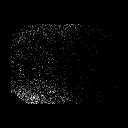
[frame 17/18]
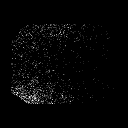

[vq ventlung · 4.75mm/px · 6 of 18 frames shown (3 of 3)]
[frame 2/18  full-range]
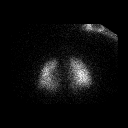
[frame 5/18  full-range]
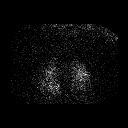
[frame 8/18]
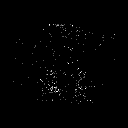
[frame 11/18]
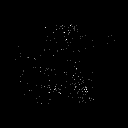
[frame 14/18]
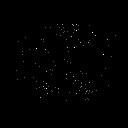
[frame 17/18]
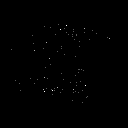

[18 of 18 positions shown; findings below may reference images not displayed]

FINDINGS: Ventilation images show symmetric distribution of Xenon activity throughout both lungs during washin and equilibrium phases.  Symmetric washout of Xenon activity is seen. 
 Perfusion images show symmetric distribution of activity throughout both lungs.  No segmental perfusion defects are identified in either lung.
IMPRESSION: Normal study.  No evidence of pulmonary embolism.

## 2008-05-06 ENCOUNTER — Telehealth (INDEPENDENT_AMBULATORY_CARE_PROVIDER_SITE_OTHER): Payer: Self-pay | Admitting: Pharmacy Technician

## 2008-05-06 ENCOUNTER — Emergency Department (HOSPITAL_COMMUNITY): Admission: EM | Admit: 2008-05-06 | Discharge: 2008-05-06 | Payer: Self-pay | Admitting: Emergency Medicine

## 2008-05-08 ENCOUNTER — Telehealth (INDEPENDENT_AMBULATORY_CARE_PROVIDER_SITE_OTHER): Payer: Self-pay | Admitting: Pharmacy Technician

## 2008-05-11 ENCOUNTER — Encounter: Payer: Self-pay | Admitting: Internal Medicine

## 2008-05-11 ENCOUNTER — Ambulatory Visit: Payer: Self-pay | Admitting: Internal Medicine

## 2008-05-11 LAB — CONVERTED CEMR LAB
ALT: 10 units/L (ref 0–35)
AST: 28 units/L (ref 0–37)
Albumin: 2.8 g/dL — ABNORMAL LOW (ref 3.5–5.2)
Blood Glucose, Fingerstick: 327
CO2: 22 meq/L (ref 19–32)
Calcium: 9 mg/dL (ref 8.4–10.5)
Chloride: 101 meq/L (ref 96–112)
Creatinine, Ser: 0.87 mg/dL (ref 0.40–1.20)
Hgb A1c MFr Bld: >14 %
Potassium: 3.7 meq/L (ref 3.5–5.3)
Total Protein: 7.3 g/dL (ref 6.0–8.3)

## 2008-05-20 ENCOUNTER — Encounter: Payer: Self-pay | Admitting: Internal Medicine

## 2008-06-02 ENCOUNTER — Telehealth: Payer: Self-pay | Admitting: *Deleted

## 2008-06-22 ENCOUNTER — Telehealth: Payer: Self-pay | Admitting: *Deleted

## 2008-07-07 ENCOUNTER — Encounter: Payer: Self-pay | Admitting: Internal Medicine

## 2008-07-07 ENCOUNTER — Ambulatory Visit: Payer: Self-pay | Admitting: *Deleted

## 2008-07-07 DIAGNOSIS — B379 Candidiasis, unspecified: Secondary | ICD-10-CM | POA: Insufficient documentation

## 2008-07-07 DIAGNOSIS — L732 Hidradenitis suppurativa: Secondary | ICD-10-CM | POA: Insufficient documentation

## 2008-07-07 LAB — CONVERTED CEMR LAB
Hemoglobin, Urine: NEGATIVE
Ketones, ur: NEGATIVE mg/dL
Leukocytes, UA: NEGATIVE
Nitrite: NEGATIVE
Protein, ur: NEGATIVE mg/dL
RBC / HPF: NONE SEEN (ref <3)
Urobilinogen, UA: 0.2 (ref 0.0–1.0)
pH: 6 (ref 5.0–8.0)

## 2008-07-08 ENCOUNTER — Encounter: Payer: Self-pay | Admitting: Internal Medicine

## 2008-07-13 IMAGING — CR DG CHEST 1V PORT
1 series · 1 of 1 positions shown · non-contrast
Comparison: 07/16/2006.

Exam: Chest, one view.

HISTORY: Shortness of breath.

[view not recorded]
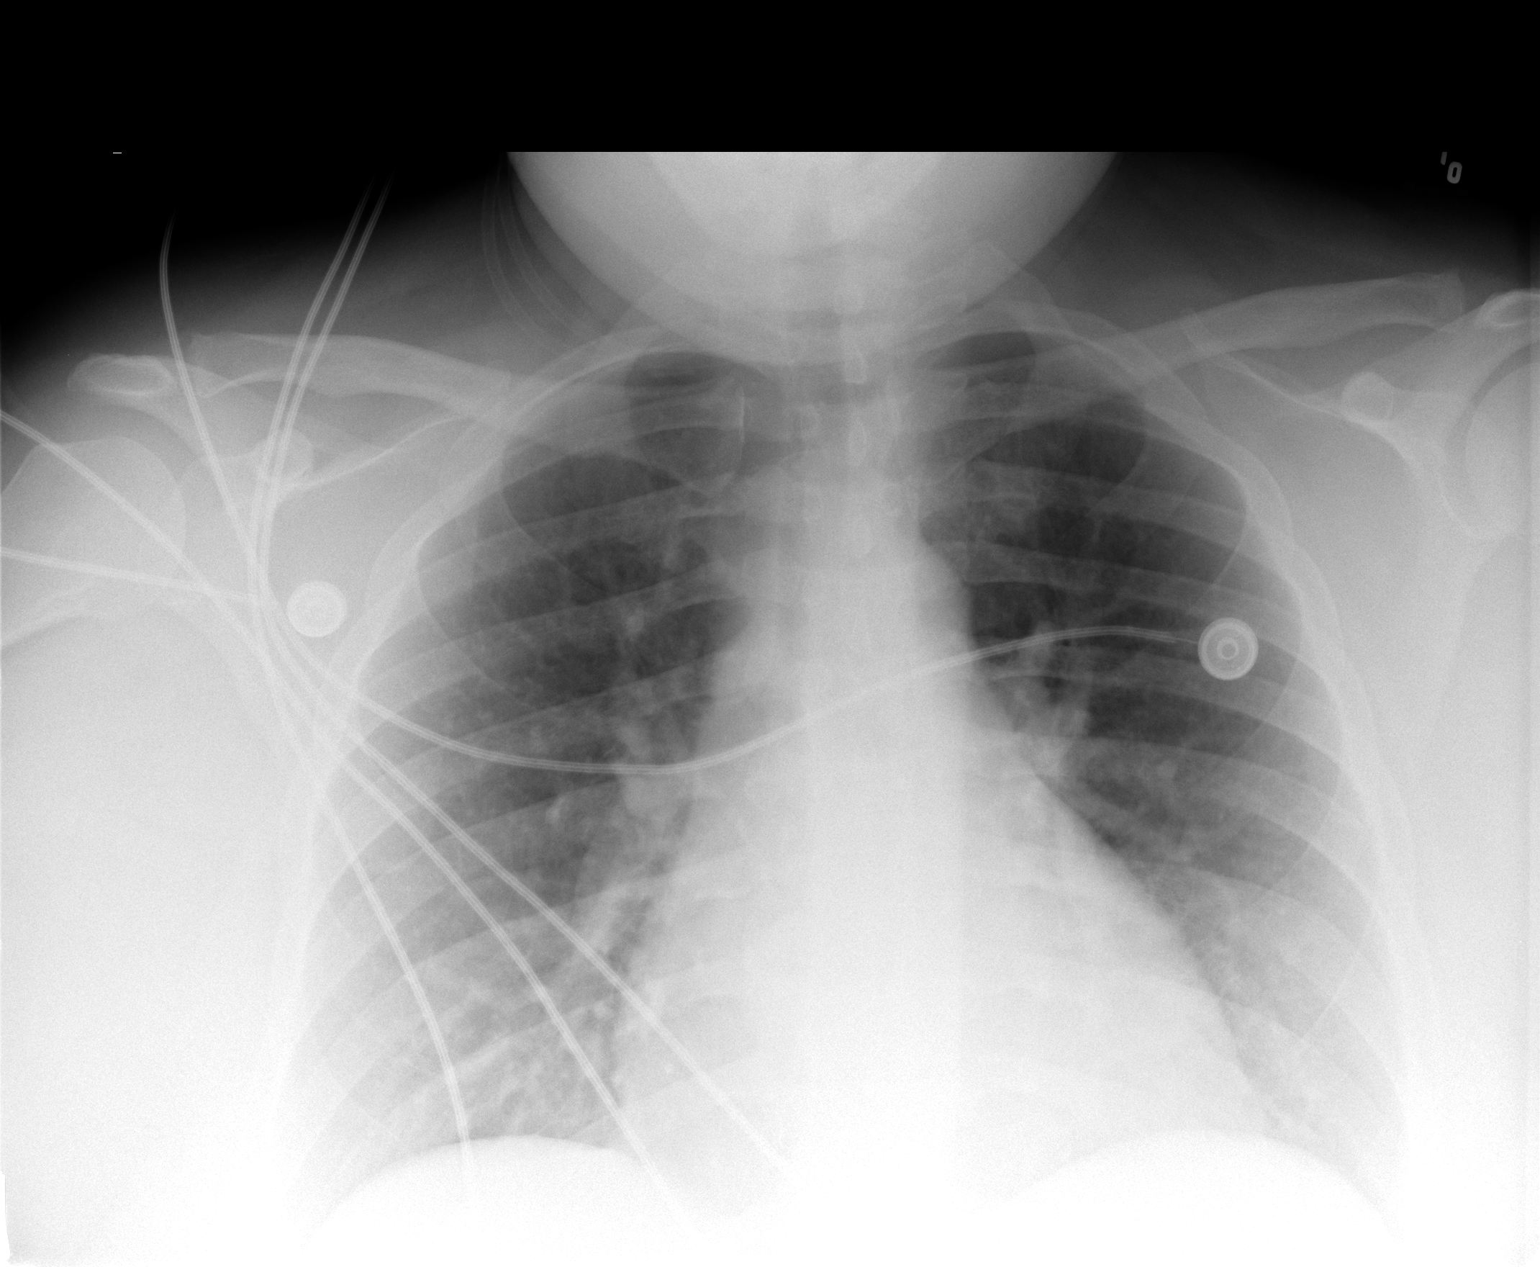

[1 of 1 positions shown; findings below may reference images not displayed]

FINDINGS: Heart size is mildly enlarged.

There are low lung volumes. 

No pleural effusions or pulmonary edema. 

No airspace opacities identified.
IMPRESSION: 1. Low lung volumes. 
2. No acute cardiopulmonary disease.

## 2008-07-28 ENCOUNTER — Encounter: Admission: RE | Admit: 2008-07-28 | Discharge: 2008-10-26 | Payer: Self-pay | Admitting: Internal Medicine

## 2008-08-04 ENCOUNTER — Encounter: Payer: Self-pay | Admitting: Internal Medicine

## 2008-08-04 ENCOUNTER — Telehealth: Payer: Self-pay | Admitting: *Deleted

## 2008-08-12 ENCOUNTER — Ambulatory Visit: Payer: Self-pay | Admitting: Infectious Disease

## 2008-08-12 ENCOUNTER — Encounter: Payer: Self-pay | Admitting: Internal Medicine

## 2008-08-12 LAB — CONVERTED CEMR LAB
BUN: 8 mg/dL (ref 6–23)
Blood Glucose, Fingerstick: 533
Calcium: 9.6 mg/dL (ref 8.4–10.5)
Creatinine, Urine: 16.2 mg/dL
GFR calc Af Amer: >60 mL/min (ref >60)
GFR calc non Af Amer: >60 mL/min (ref >60)
Glucose, Bld: 448 mg/dL — ABNORMAL HIGH (ref 70–99)
Glucose, Urine, Semiquant: >=1000
HCT: 35.2 % — ABNORMAL LOW (ref 36.0–46.0)
Hemoglobin: 11.7 g/dL — ABNORMAL LOW (ref 12.0–15.0)
Hgb A1c MFr Bld: >14 %
Ketones, urine, test strip: NEGATIVE
MCHC: 33.2 g/dL (ref 30.0–36.0)
Nitrite: NEGATIVE
Platelets: 510 10*3/uL — ABNORMAL HIGH (ref 150–400)
Potassium: 4.2 meq/L (ref 3.5–5.3)
RDW: 18.3 % — ABNORMAL HIGH (ref 11.5–15.5)
Sodium: 136 meq/L (ref 135–145)
Specific Gravity, Urine: <1.005
Urobilinogen, UA: 0.2
pH: 6.5

## 2008-08-14 IMAGING — CR DG CHEST 1V PORT
1 series · 1 of 1 positions shown · non-contrast
Comparison: 11/07/06.

CLINICAL DATA: Shortness of breath.  
 PORTABLE CHEST ? 1 VIEW:

[view not recorded]
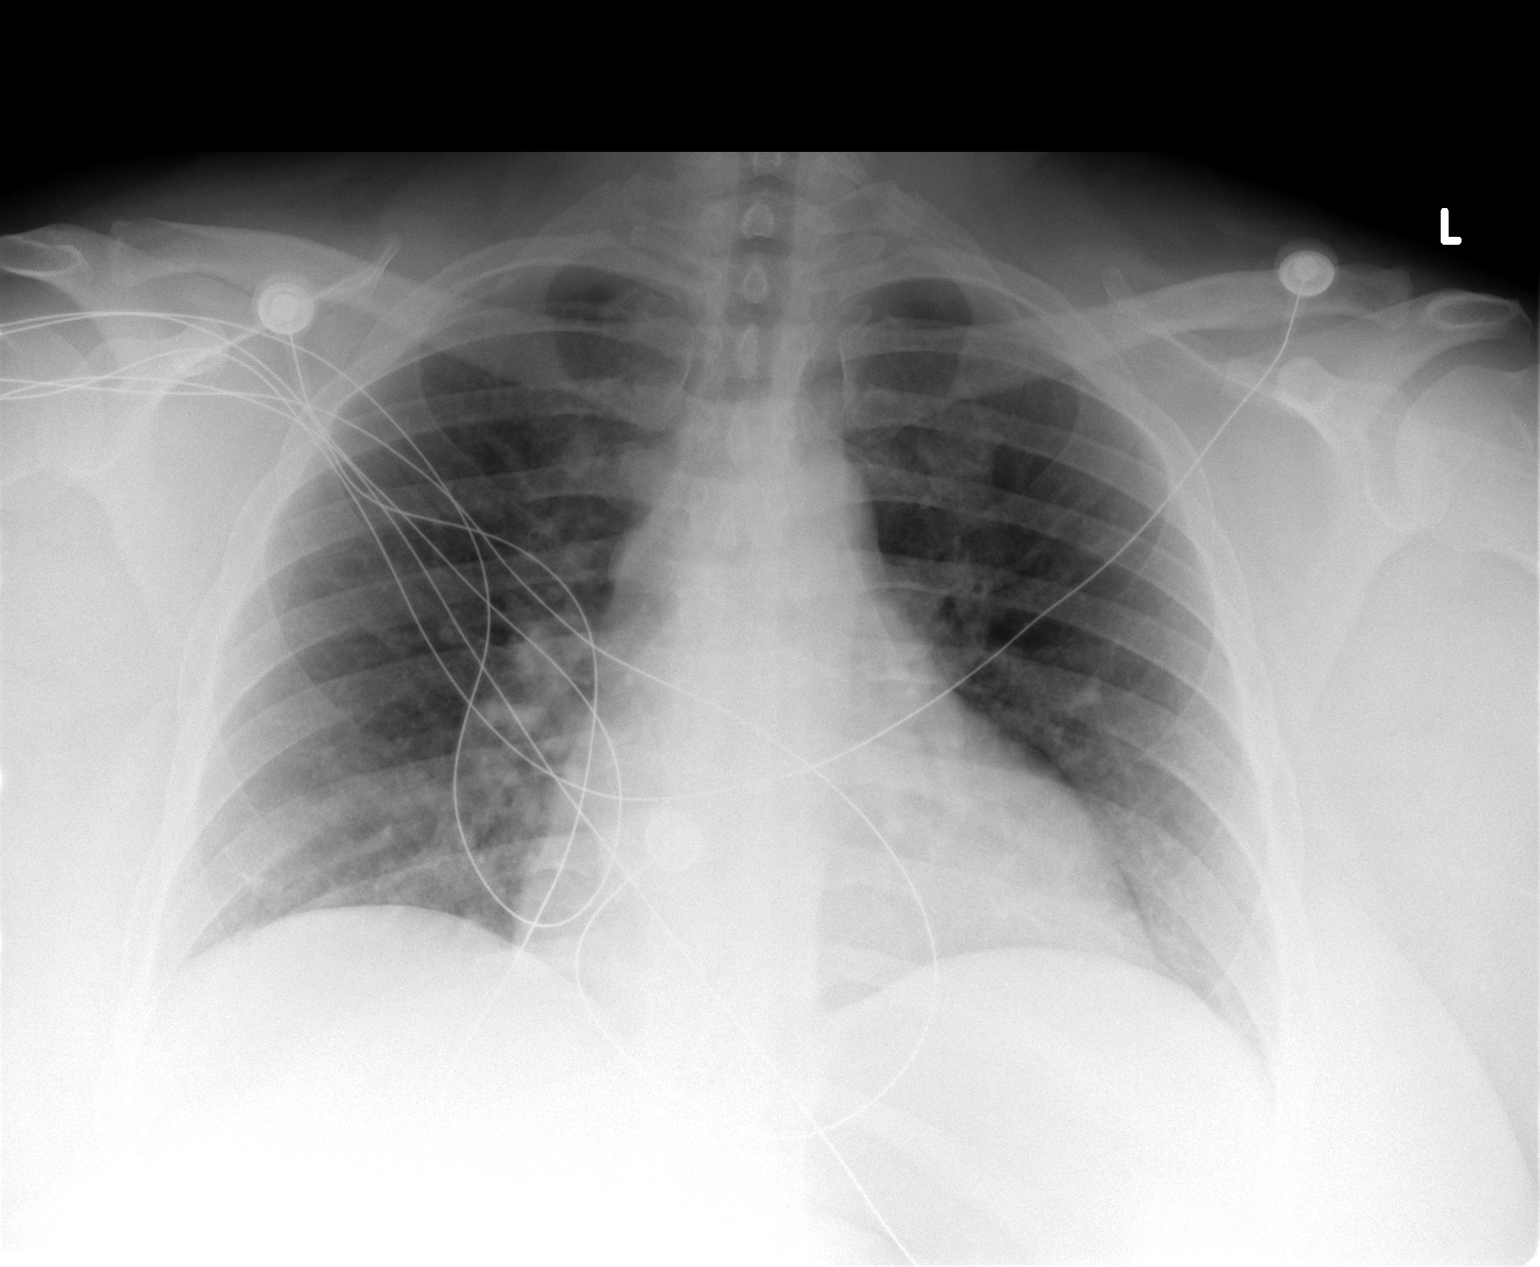

[1 of 1 positions shown; findings below may reference images not displayed]

FINDINGS: Heart size is mildly enlarged.  The lungs are clear.  No effusion.
IMPRESSION: Mild cardiomegaly without acute disease.

## 2008-08-17 ENCOUNTER — Encounter: Payer: Self-pay | Admitting: Internal Medicine

## 2008-09-07 ENCOUNTER — Telehealth: Payer: Self-pay | Admitting: Internal Medicine

## 2008-09-08 ENCOUNTER — Emergency Department (HOSPITAL_COMMUNITY): Admission: EM | Admit: 2008-09-08 | Discharge: 2008-09-08 | Payer: Self-pay | Admitting: Emergency Medicine

## 2008-09-08 ENCOUNTER — Ambulatory Visit: Payer: Self-pay | Admitting: *Deleted

## 2008-09-08 ENCOUNTER — Encounter (INDEPENDENT_AMBULATORY_CARE_PROVIDER_SITE_OTHER): Payer: Self-pay | Admitting: *Deleted

## 2008-09-08 LAB — CONVERTED CEMR LAB
BUN: 8 mg/dL (ref 6–23)
Calcium: 9.6 mg/dL (ref 8.4–10.5)
GFR calc Af Amer: >60 mL/min (ref >60)
GFR calc non Af Amer: >60 mL/min (ref >60)
Glucose, Bld: 397 mg/dL — ABNORMAL HIGH (ref 70–99)

## 2008-09-11 ENCOUNTER — Telehealth: Payer: Self-pay | Admitting: Internal Medicine

## 2008-09-14 ENCOUNTER — Telehealth: Payer: Self-pay | Admitting: Internal Medicine

## 2008-10-06 ENCOUNTER — Encounter: Payer: Self-pay | Admitting: Internal Medicine

## 2008-10-12 ENCOUNTER — Emergency Department (HOSPITAL_COMMUNITY): Admission: EM | Admit: 2008-10-12 | Discharge: 2008-10-12 | Payer: Self-pay | Admitting: Emergency Medicine

## 2008-10-19 ENCOUNTER — Telehealth: Payer: Self-pay | Admitting: Internal Medicine

## 2008-10-27 ENCOUNTER — Encounter: Payer: Self-pay | Admitting: Internal Medicine

## 2008-10-30 ENCOUNTER — Ambulatory Visit: Payer: Self-pay | Admitting: Internal Medicine

## 2008-10-30 ENCOUNTER — Encounter: Payer: Self-pay | Admitting: Internal Medicine

## 2008-10-30 LAB — CONVERTED CEMR LAB
BUN: 11 mg/dL (ref 6–23)
CO2: 23 meq/L (ref 19–32)
Calcium: 9.7 mg/dL (ref 8.4–10.5)
Creatinine, Ser: 0.86 mg/dL (ref 0.40–1.20)
Glucose, Bld: 497 mg/dL — ABNORMAL HIGH (ref 70–99)

## 2008-11-13 ENCOUNTER — Ambulatory Visit: Payer: Self-pay | Admitting: Infectious Diseases

## 2008-11-13 ENCOUNTER — Encounter: Admission: RE | Admit: 2008-11-13 | Discharge: 2009-01-21 | Payer: Self-pay | Admitting: Internal Medicine

## 2008-11-19 ENCOUNTER — Telehealth: Payer: Self-pay | Admitting: Internal Medicine

## 2008-11-30 ENCOUNTER — Ambulatory Visit: Payer: Self-pay | Admitting: Internal Medicine

## 2008-12-03 ENCOUNTER — Encounter: Payer: Self-pay | Admitting: Internal Medicine

## 2008-12-08 ENCOUNTER — Encounter: Payer: Self-pay | Admitting: Internal Medicine

## 2008-12-16 ENCOUNTER — Telehealth: Payer: Self-pay | Admitting: *Deleted

## 2008-12-22 ENCOUNTER — Telehealth: Payer: Self-pay | Admitting: Internal Medicine

## 2009-01-10 IMAGING — CR DG CHEST 2V
2 series · 2 of 2 positions shown · non-contrast
Comparison: 12/09/06.

CLINICAL DATA: Left-sided chest pain.  Shortness of breath.  
 CHEST - 2 VIEW:

[w chest pa]
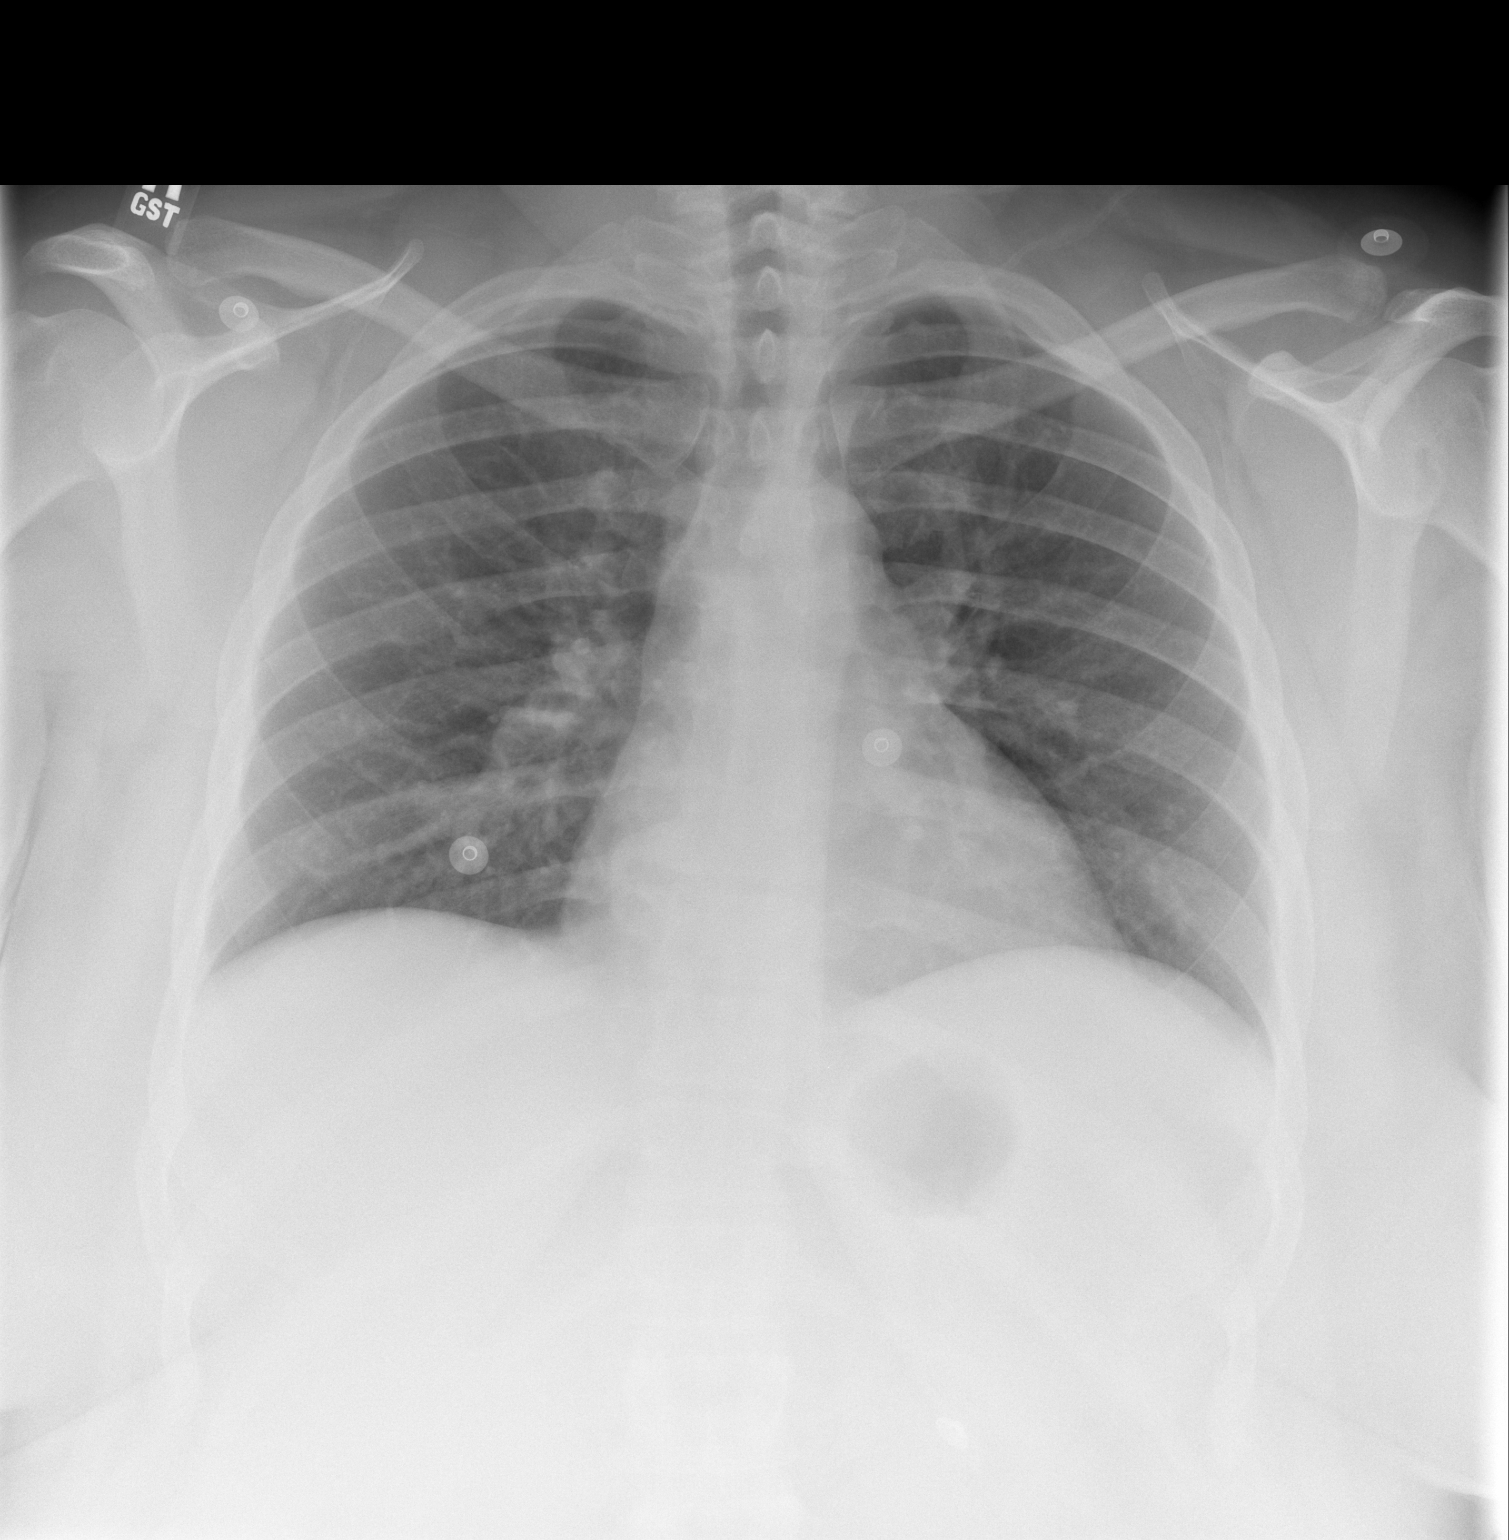

[w chest lat]
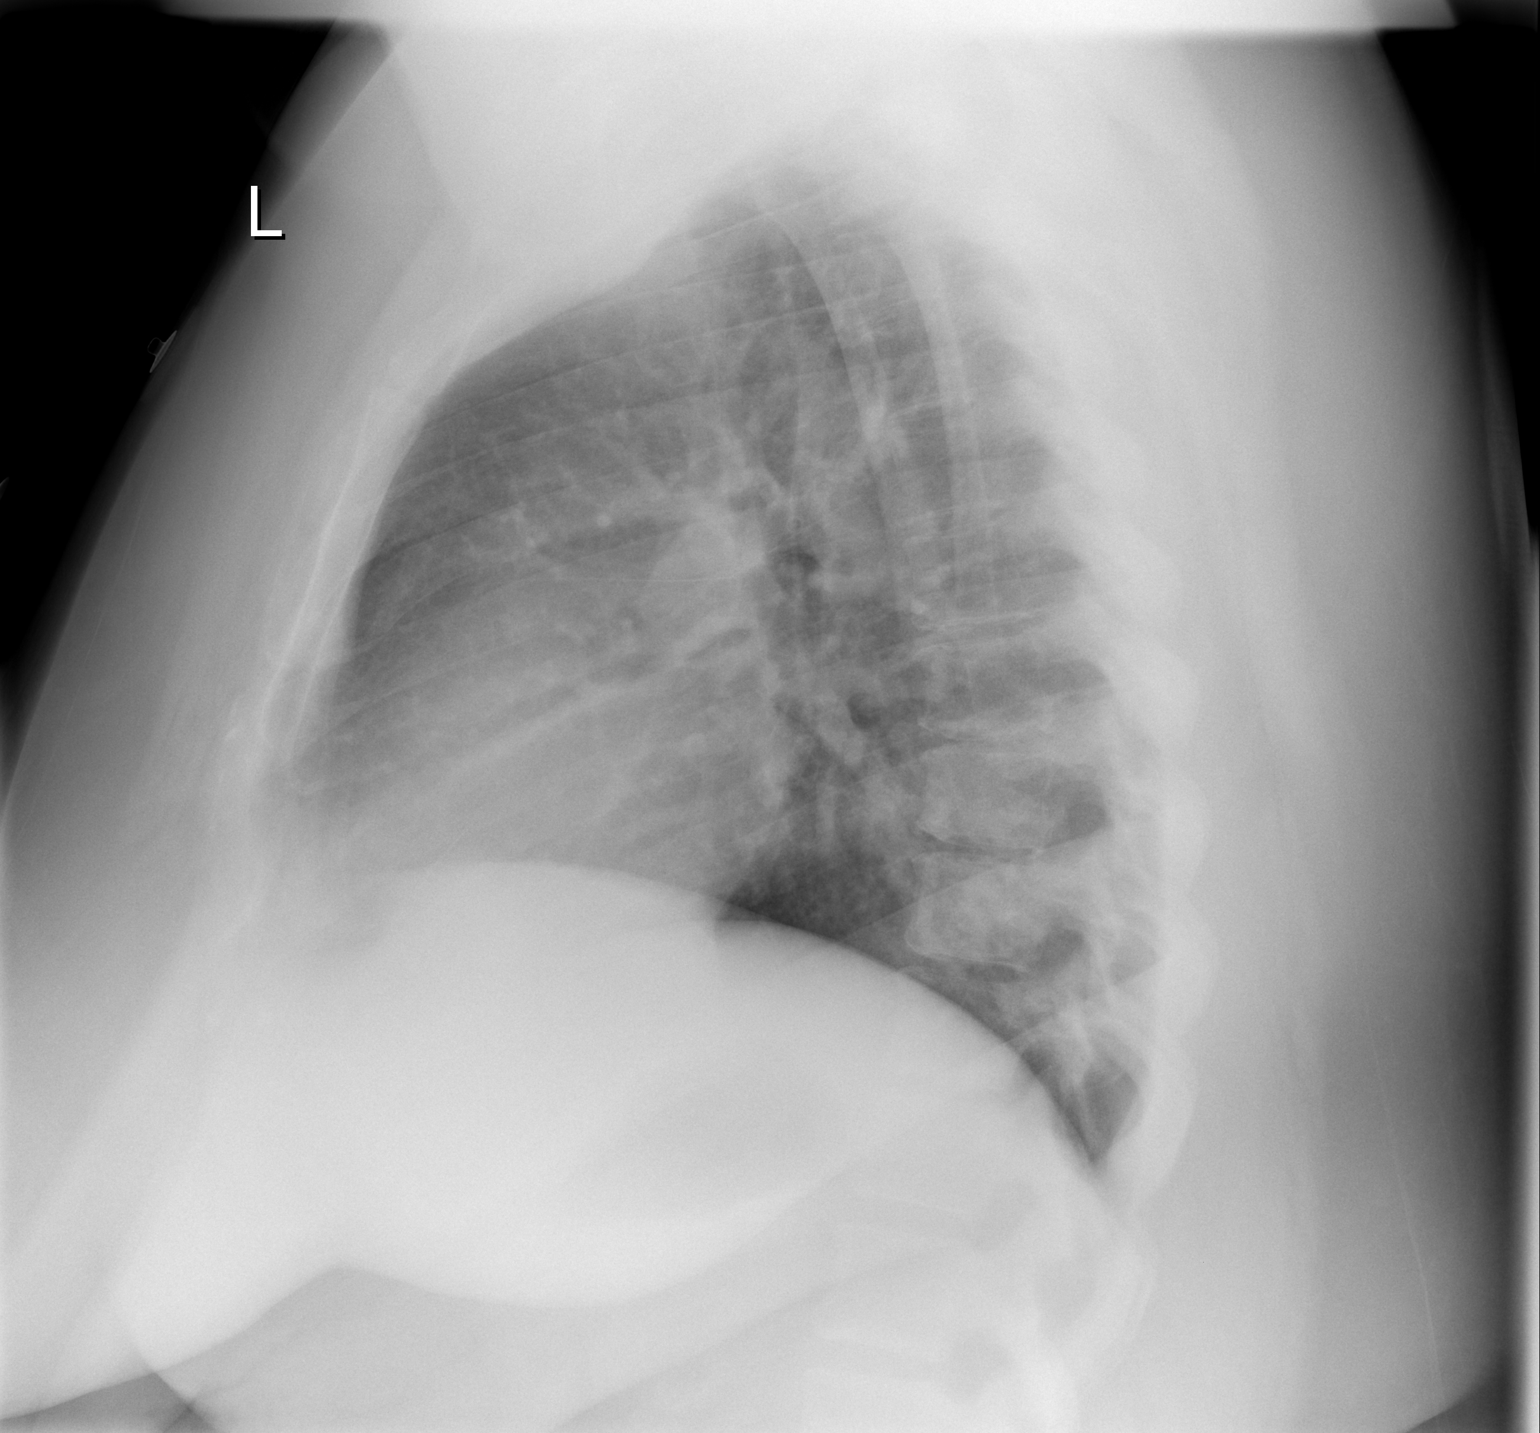

[2 of 2 positions shown; findings below may reference images not displayed]

FINDINGS: The heart size and mediastinal contours are within normal limits.  Both lungs are clear.  The visualized skeletal structures are unremarkable.
IMPRESSION: No active cardiopulmonary disease.

## 2009-01-11 ENCOUNTER — Telehealth: Payer: Self-pay | Admitting: *Deleted

## 2009-01-11 IMAGING — CT CT HEAD W/O CM
3 of 5 series · 14 of 47 positions shown, 16 images · IV contrast (APPLIED)
Comparison: 11/16/04.

CLINICAL DATA: Headaches, dyspnea, chest pain, elevated D-dimer. 
HEAD CT WITHOUT CONTRAST:
TECHNIQUE: Contiguous axial images were obtained from the base of the skull through the vertex according to standard protocol without contrast.
TECHNIQUE: Multidetector CT imaging of the chest was performed during bolus injection of intravenous contrast.  Multiplanar CT angiographic image reconstructions were generated to evaluate the vascular anatomy.  Two separate runs were obtained due to nondiagnostic study on the 1st run.  
Contrast:  180 cc Omnipaque 300.

[Series 7: pulm embolism 1.0 thins · axial · 0.70mm/px · z∈[-470,-296]mm · 8 of 216 slices shown, 10 images]
[im 21/216  brain]
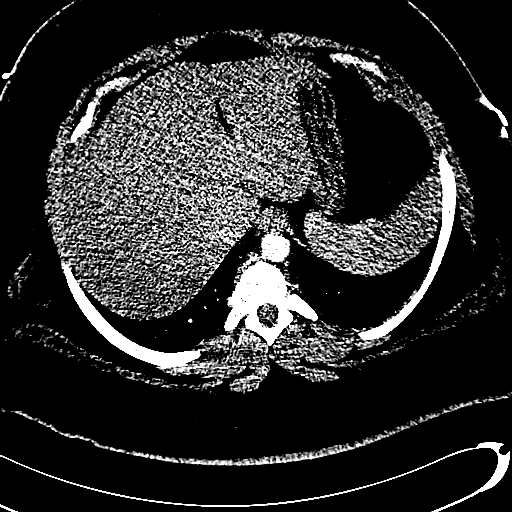
[im 21/216  bone]
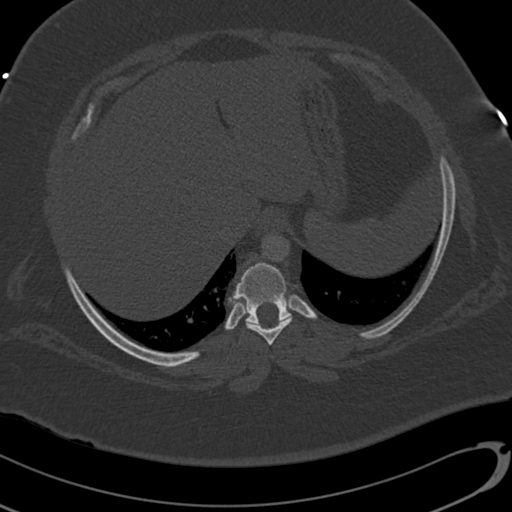
[im 41/216  brain]
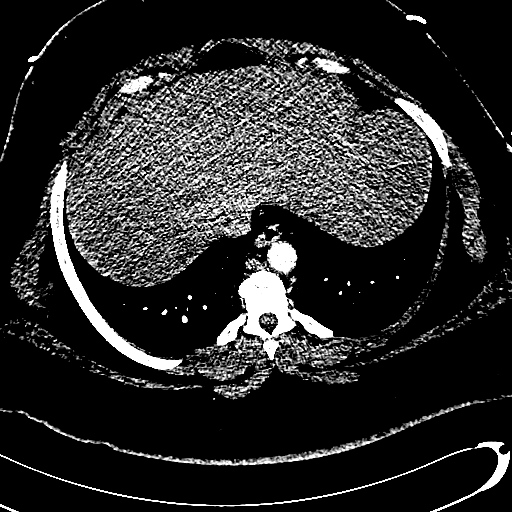
[im 72/216  brain]
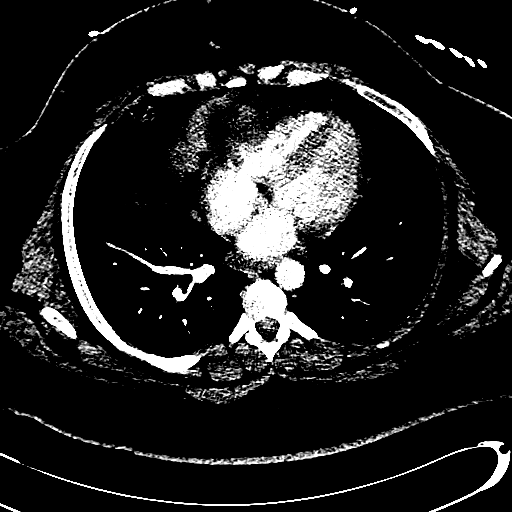
[im 93/216  brain]
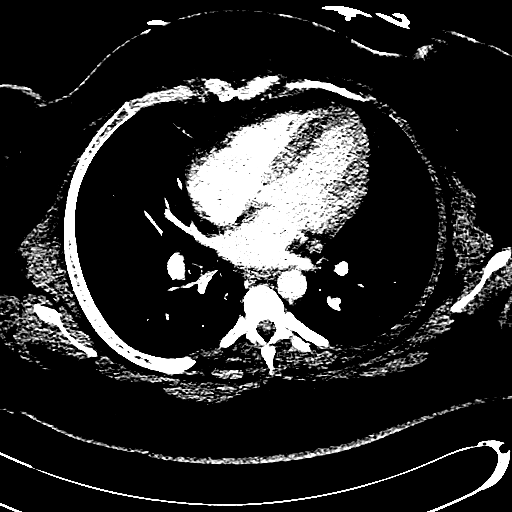
[im 123/216  brain]
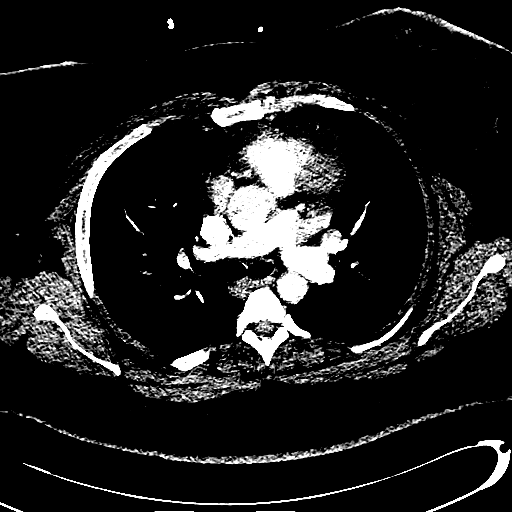
[im 123/216  bone]
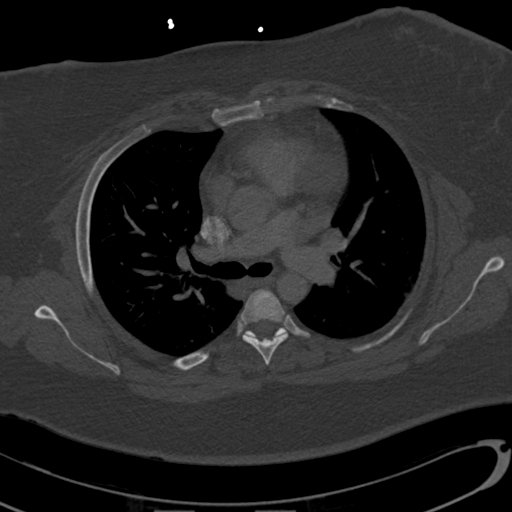
[im 144/216  brain]
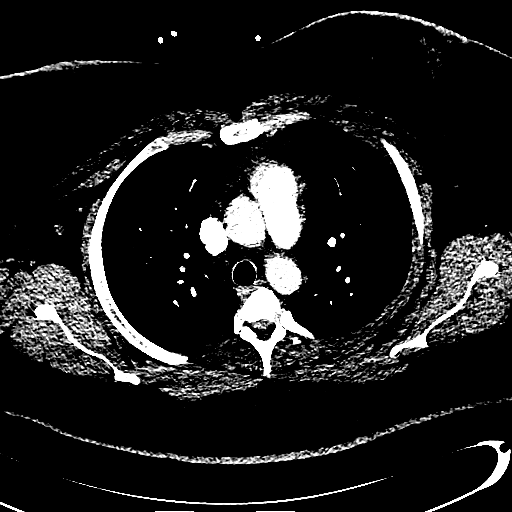
[im 175/216  brain]
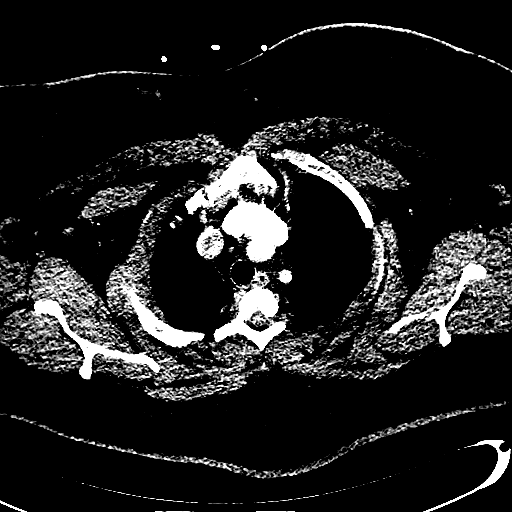
[im 195/216  brain]
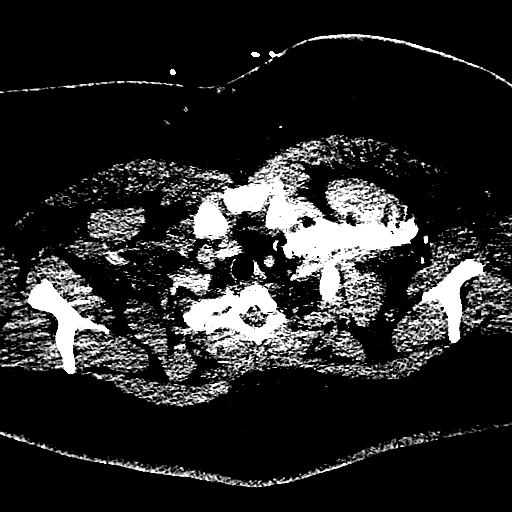

[Series 9: pulm embolism 2.0 cor · coronal · 0.46mm/px · 3 of 116 slices shown]
[im 39/116  brain]
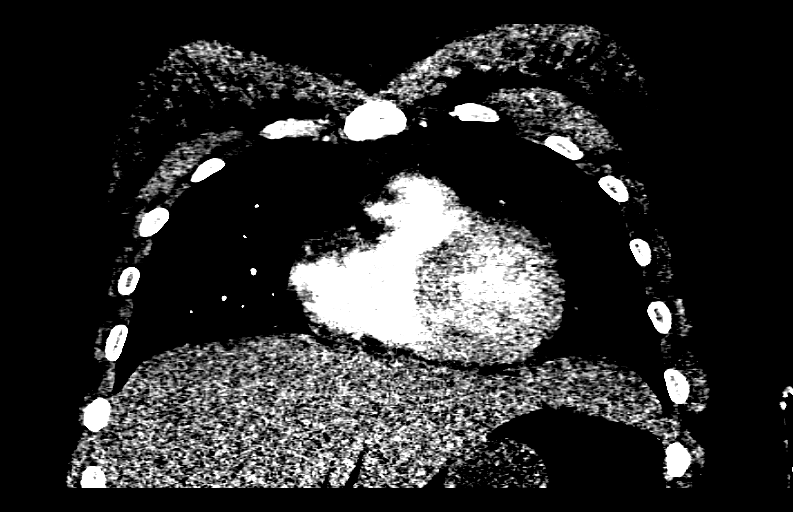
[im 52/116  brain]
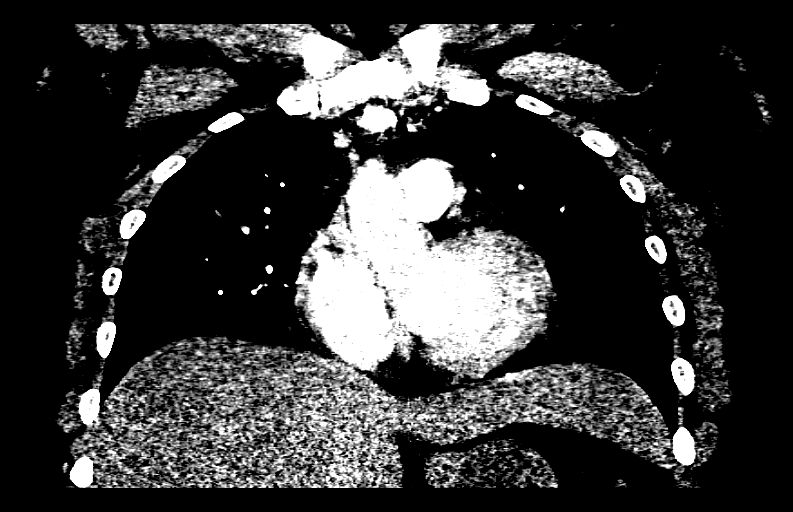
[im 64/116  brain]
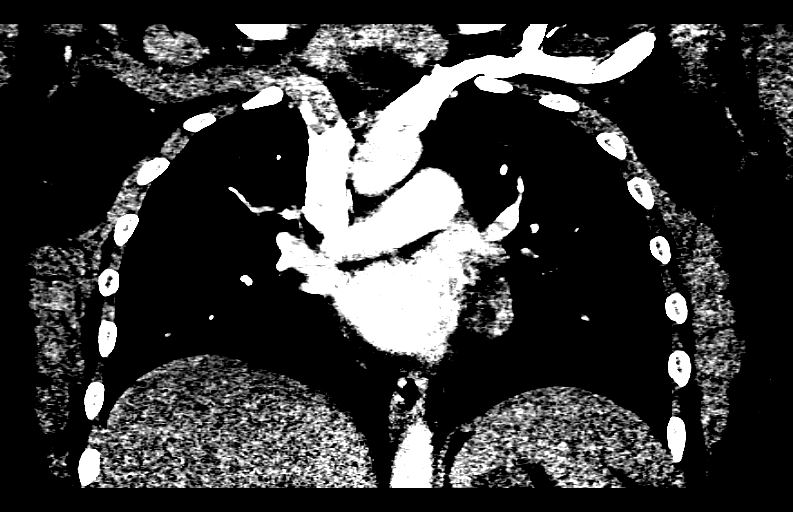

[Series 10: pulm embolism 2.0 sag · sagittal · 0.47mm/px · 3 of 164 slices shown]
[im 55/164  brain]
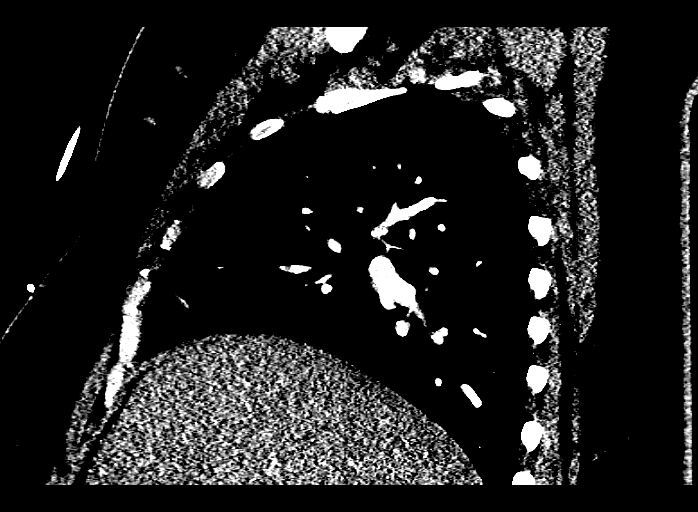
[im 82/164  brain]
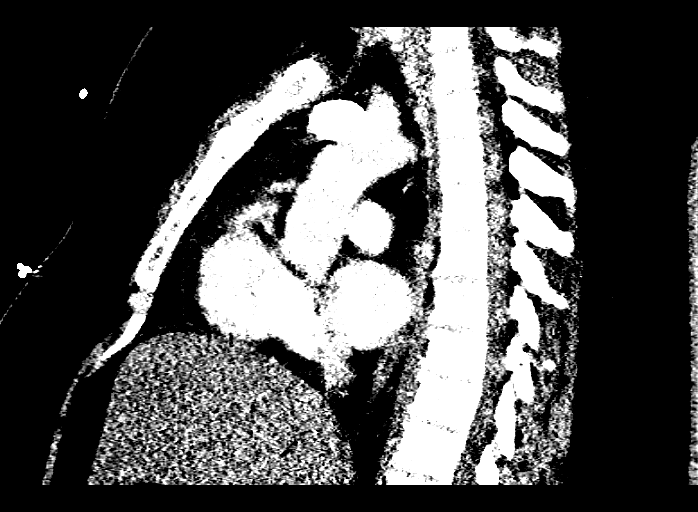
[im 109/164  brain]
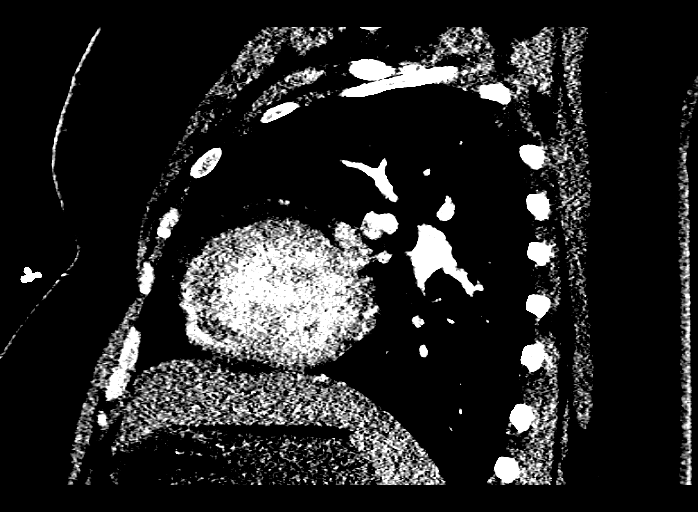

[14 of 47 positions shown; findings below may reference images not displayed]

FINDINGS: No evidence of acute intracranial abnormality.
IMPRESSION: Negative non-contrast head CT.

CT ANGIOGRAPHY OF CHEST:
FINDINGS: This is a technically adequate study.  There are no definite filling defects identified in the pulmonary arterial system to suggest pulmonary emboli.  Decreased sensitivity in the lower lungs noted secondary to respiratory motion artifact.  There is no evidence of thoracic aortic dissection or aneurysm.  No pleural or pericardial effusions or enlarged lymph nodes are identified.  Lungs are clear.
IMPRESSION: No acute abnormalities.  No evidence of pulmonary emboli or thoracic aortic dissection/aneurysm.

## 2009-01-12 ENCOUNTER — Encounter: Payer: Self-pay | Admitting: Internal Medicine

## 2009-01-21 IMAGING — CR DG CHEST 2V
2 series · 2 of 2 positions shown · non-contrast
Comparison: 05/07/2007.

CLINICAL DATA: Chest pain. Coronary artery disease.

CHEST - 2 VIEW

[w chest pa]
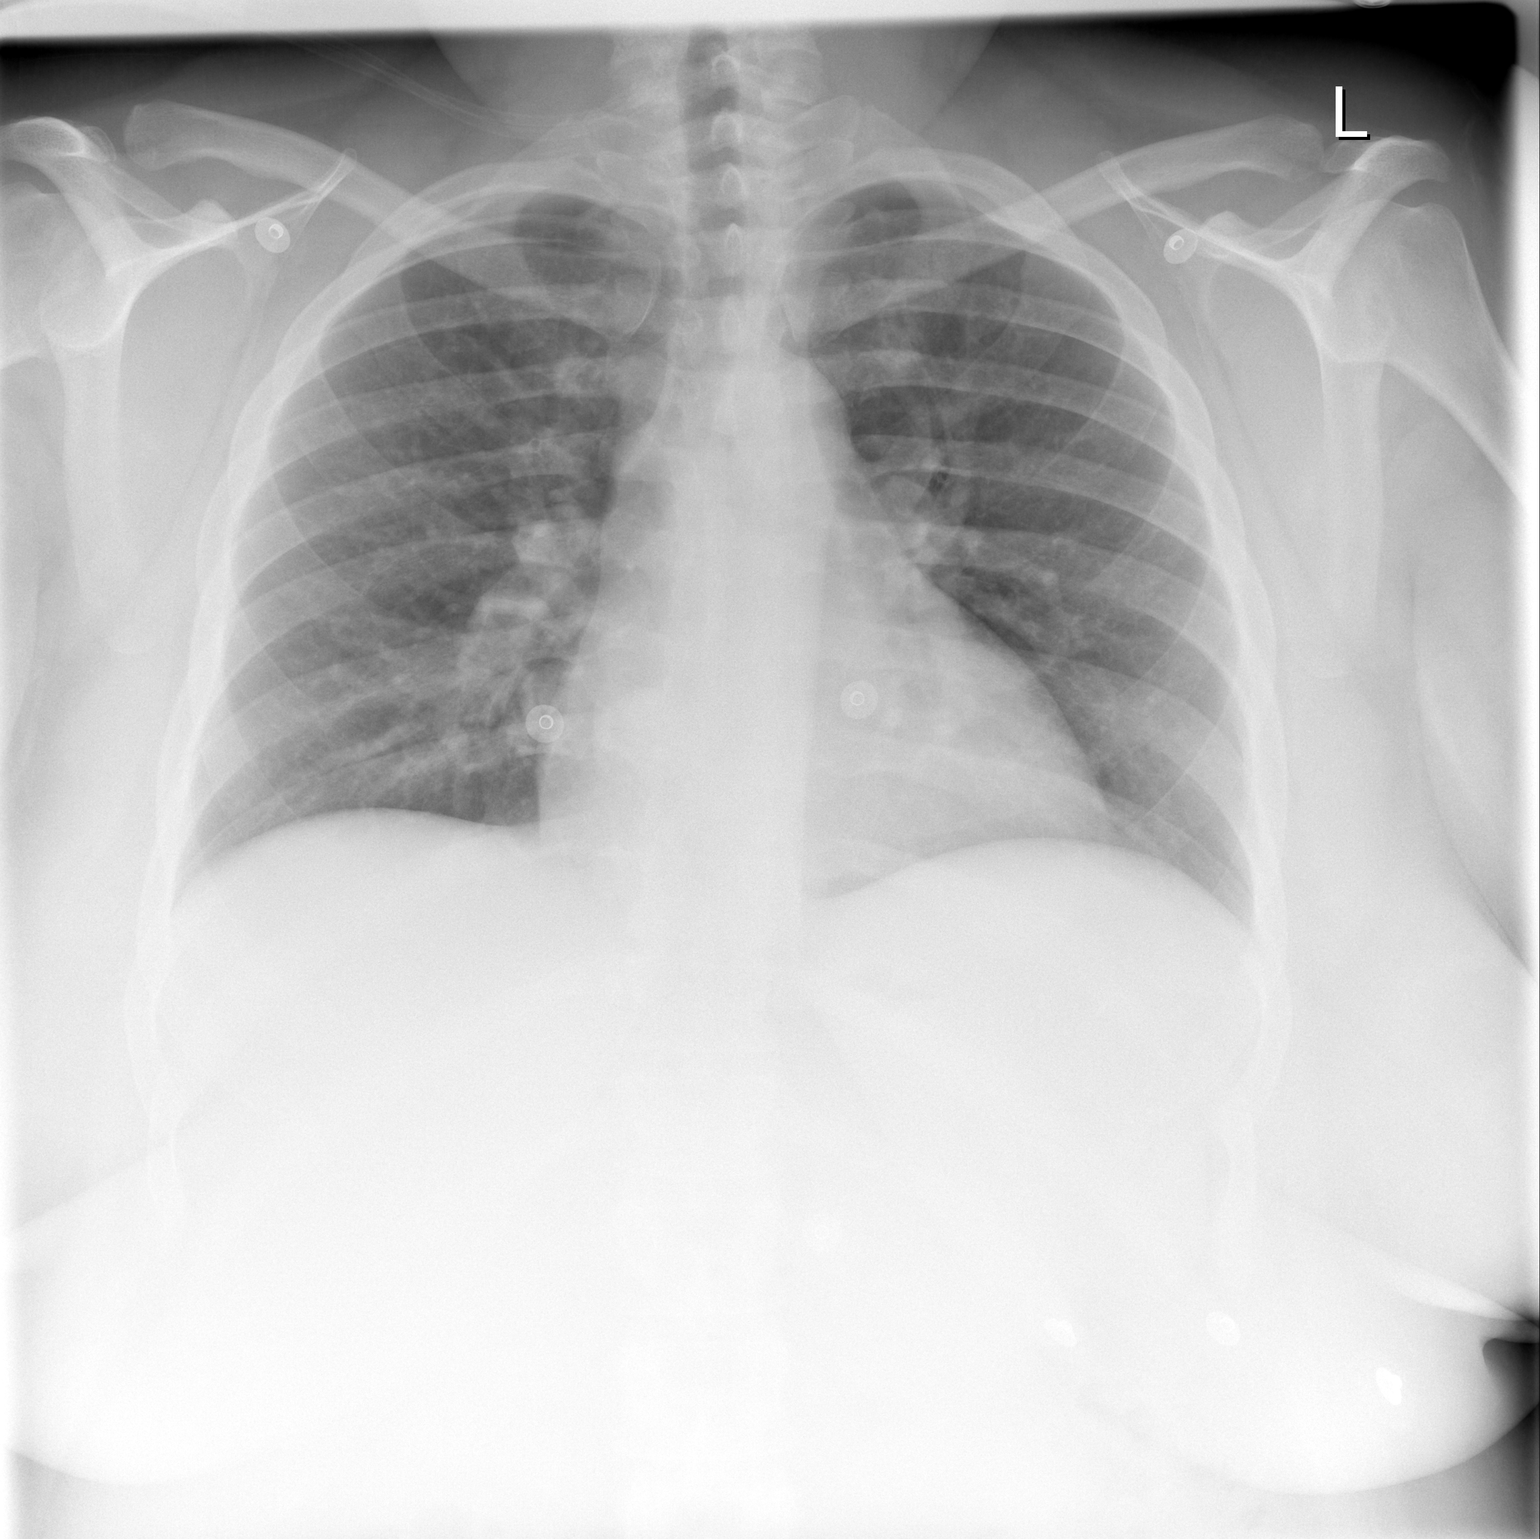

[w chest lat *]
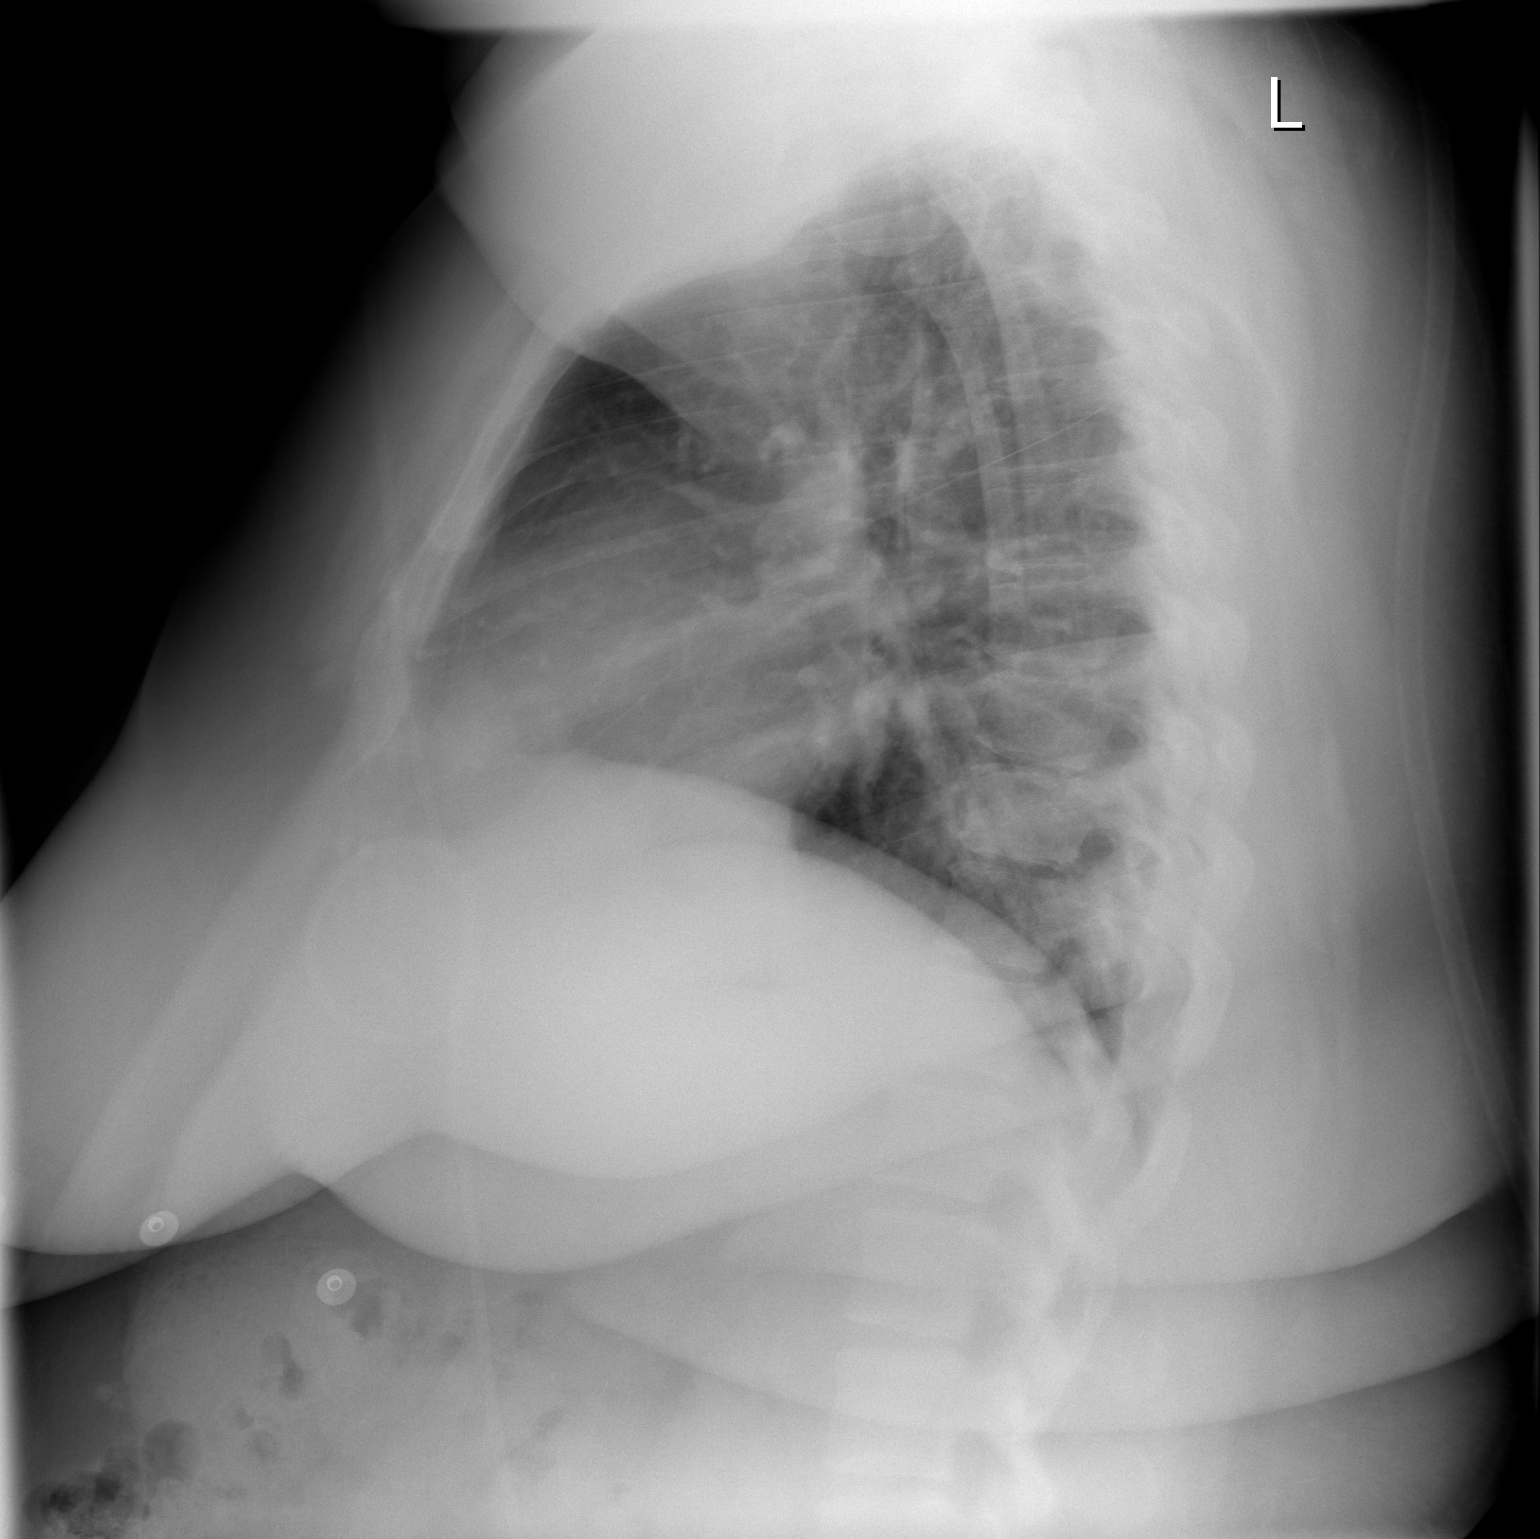

[2 of 2 positions shown; findings below may reference images not displayed]

FINDINGS: Stable normal sized heart, clear lungs and minimal diffuse
peribronchial thickening. Minimal thoracic spine degenerative changes.

IMPRESSION

Stable minimal chronic bronchitic changes. No acute abnormality.

## 2009-01-22 ENCOUNTER — Telehealth: Payer: Self-pay | Admitting: Internal Medicine

## 2009-02-22 ENCOUNTER — Telehealth: Payer: Self-pay | Admitting: Internal Medicine

## 2009-03-01 IMAGING — CR DG CHEST 2V
2 series · 2 of 2 positions shown · non-contrast
Comparison: 05/18/07.

CLINICAL DATA: Pneumonia.  Short of breath.
 CHEST ? 2 VIEW:

[w chest pa]
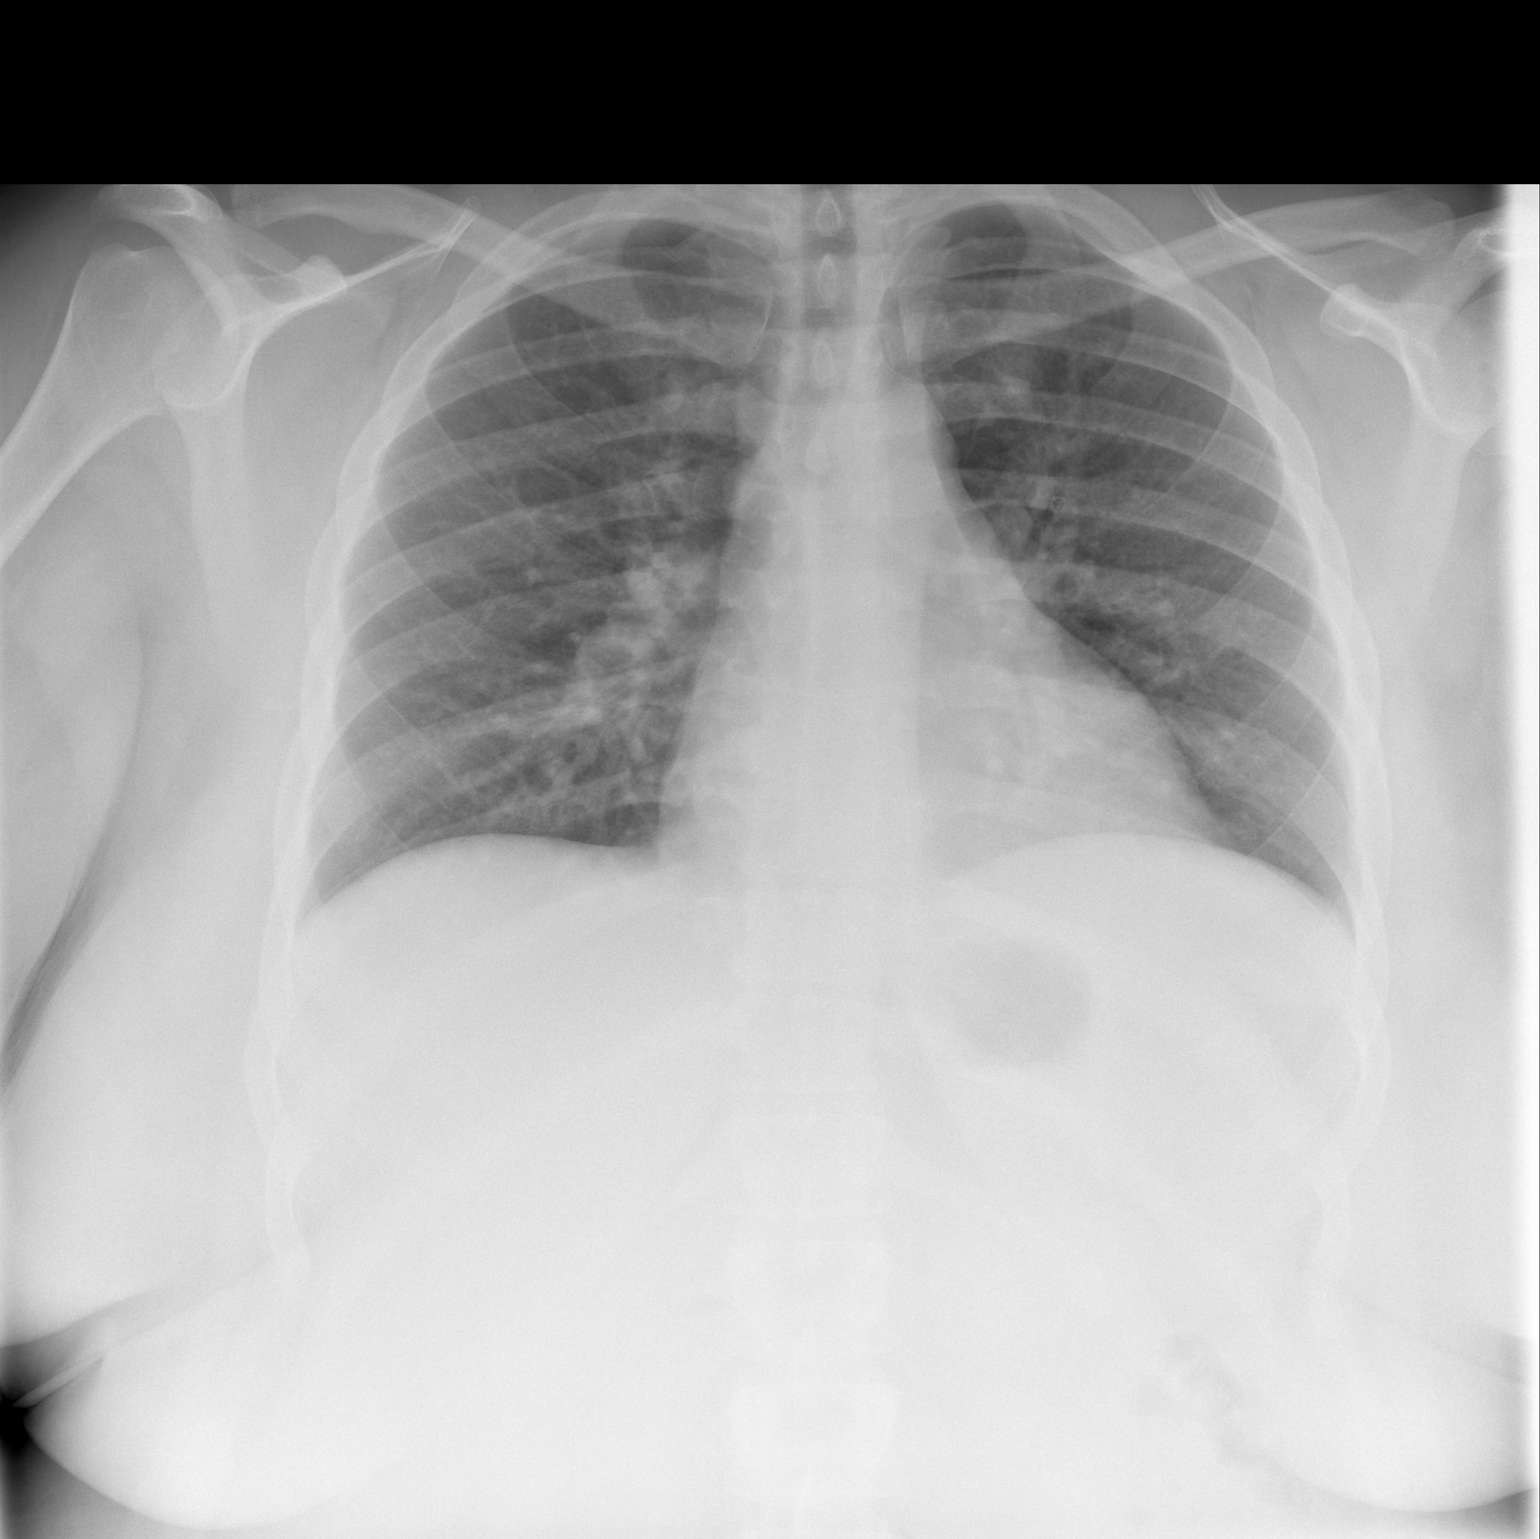

[w chest lat]
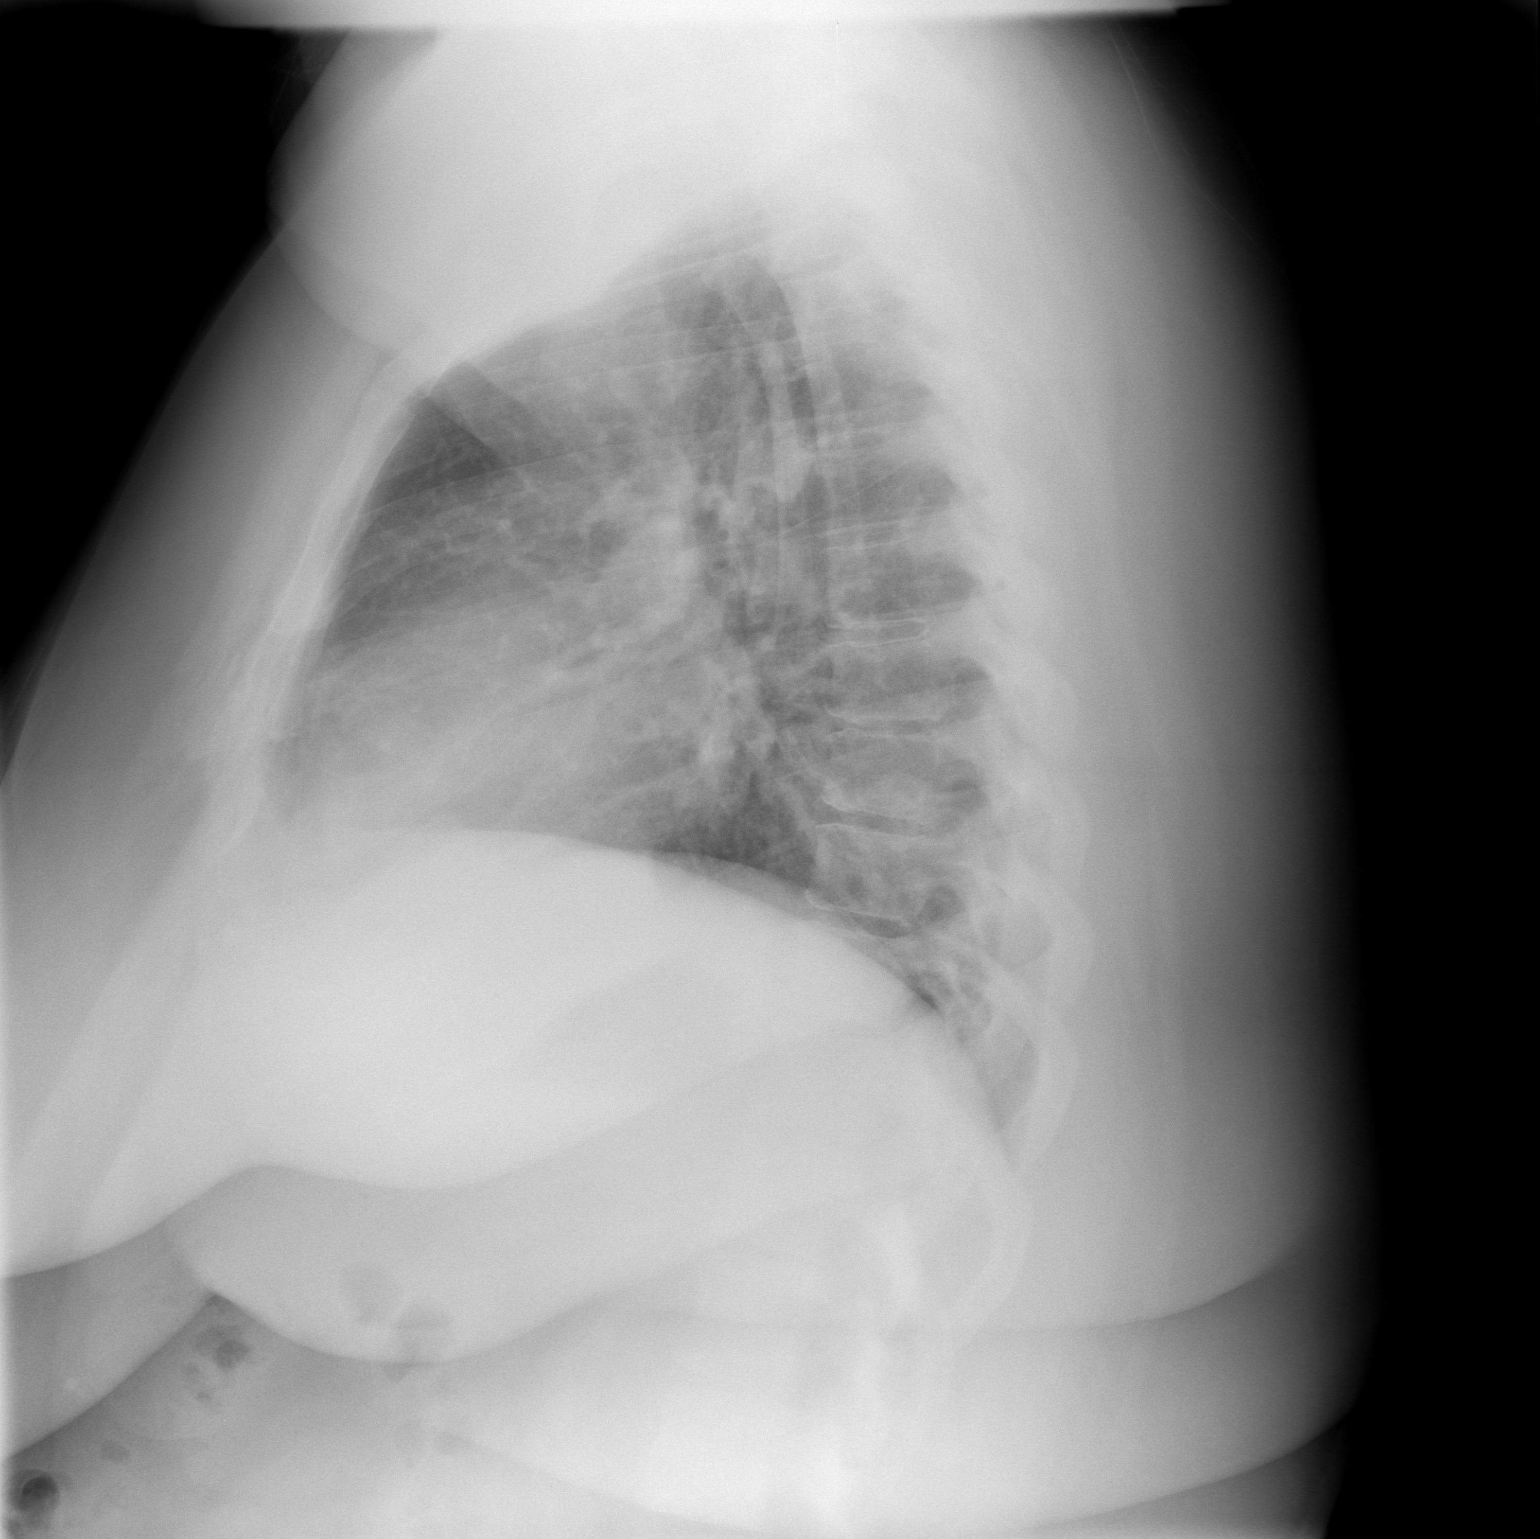

[2 of 2 positions shown; findings below may reference images not displayed]

FINDINGS: Two views of the chest show no infiltrate or effusion.  Heart size is stable.  No acute bony abnormality is seen.
IMPRESSION: No active lung disease.

## 2009-03-03 ENCOUNTER — Telehealth: Payer: Self-pay | Admitting: *Deleted

## 2009-03-03 ENCOUNTER — Encounter: Payer: Self-pay | Admitting: Internal Medicine

## 2009-03-04 ENCOUNTER — Emergency Department (HOSPITAL_COMMUNITY): Admission: EM | Admit: 2009-03-04 | Discharge: 2009-03-04 | Payer: Self-pay | Admitting: Emergency Medicine

## 2009-03-22 ENCOUNTER — Telehealth: Payer: Self-pay | Admitting: Internal Medicine

## 2009-03-24 ENCOUNTER — Telehealth: Payer: Self-pay | Admitting: *Deleted

## 2009-03-31 ENCOUNTER — Emergency Department (HOSPITAL_COMMUNITY): Admission: EM | Admit: 2009-03-31 | Discharge: 2009-03-31 | Payer: Self-pay | Admitting: Emergency Medicine

## 2009-04-08 ENCOUNTER — Observation Stay (HOSPITAL_COMMUNITY): Admission: EM | Admit: 2009-04-08 | Discharge: 2009-04-08 | Payer: Self-pay | Admitting: Emergency Medicine

## 2009-04-27 ENCOUNTER — Telehealth: Payer: Self-pay | Admitting: Internal Medicine

## 2009-05-07 ENCOUNTER — Ambulatory Visit: Payer: Self-pay | Admitting: Internal Medicine

## 2009-05-10 LAB — CONVERTED CEMR LAB
LDL Cholesterol: 107 mg/dL — ABNORMAL HIGH (ref 0–99)
Total CHOL/HDL Ratio: 5.5
VLDL: 52 mg/dL — ABNORMAL HIGH (ref 0–40)

## 2009-05-21 ENCOUNTER — Telehealth: Payer: Self-pay | Admitting: *Deleted

## 2009-05-25 ENCOUNTER — Telehealth: Payer: Self-pay | Admitting: Internal Medicine

## 2009-06-16 IMAGING — CR DG CHEST 2V
2 series · 2 of 2 positions shown · non-contrast
Comparison: PA and lateral chest 06/26/2007.

CLINICAL DATA: Shortness of breath.

CHEST - 2 VIEW

[w chest pa]
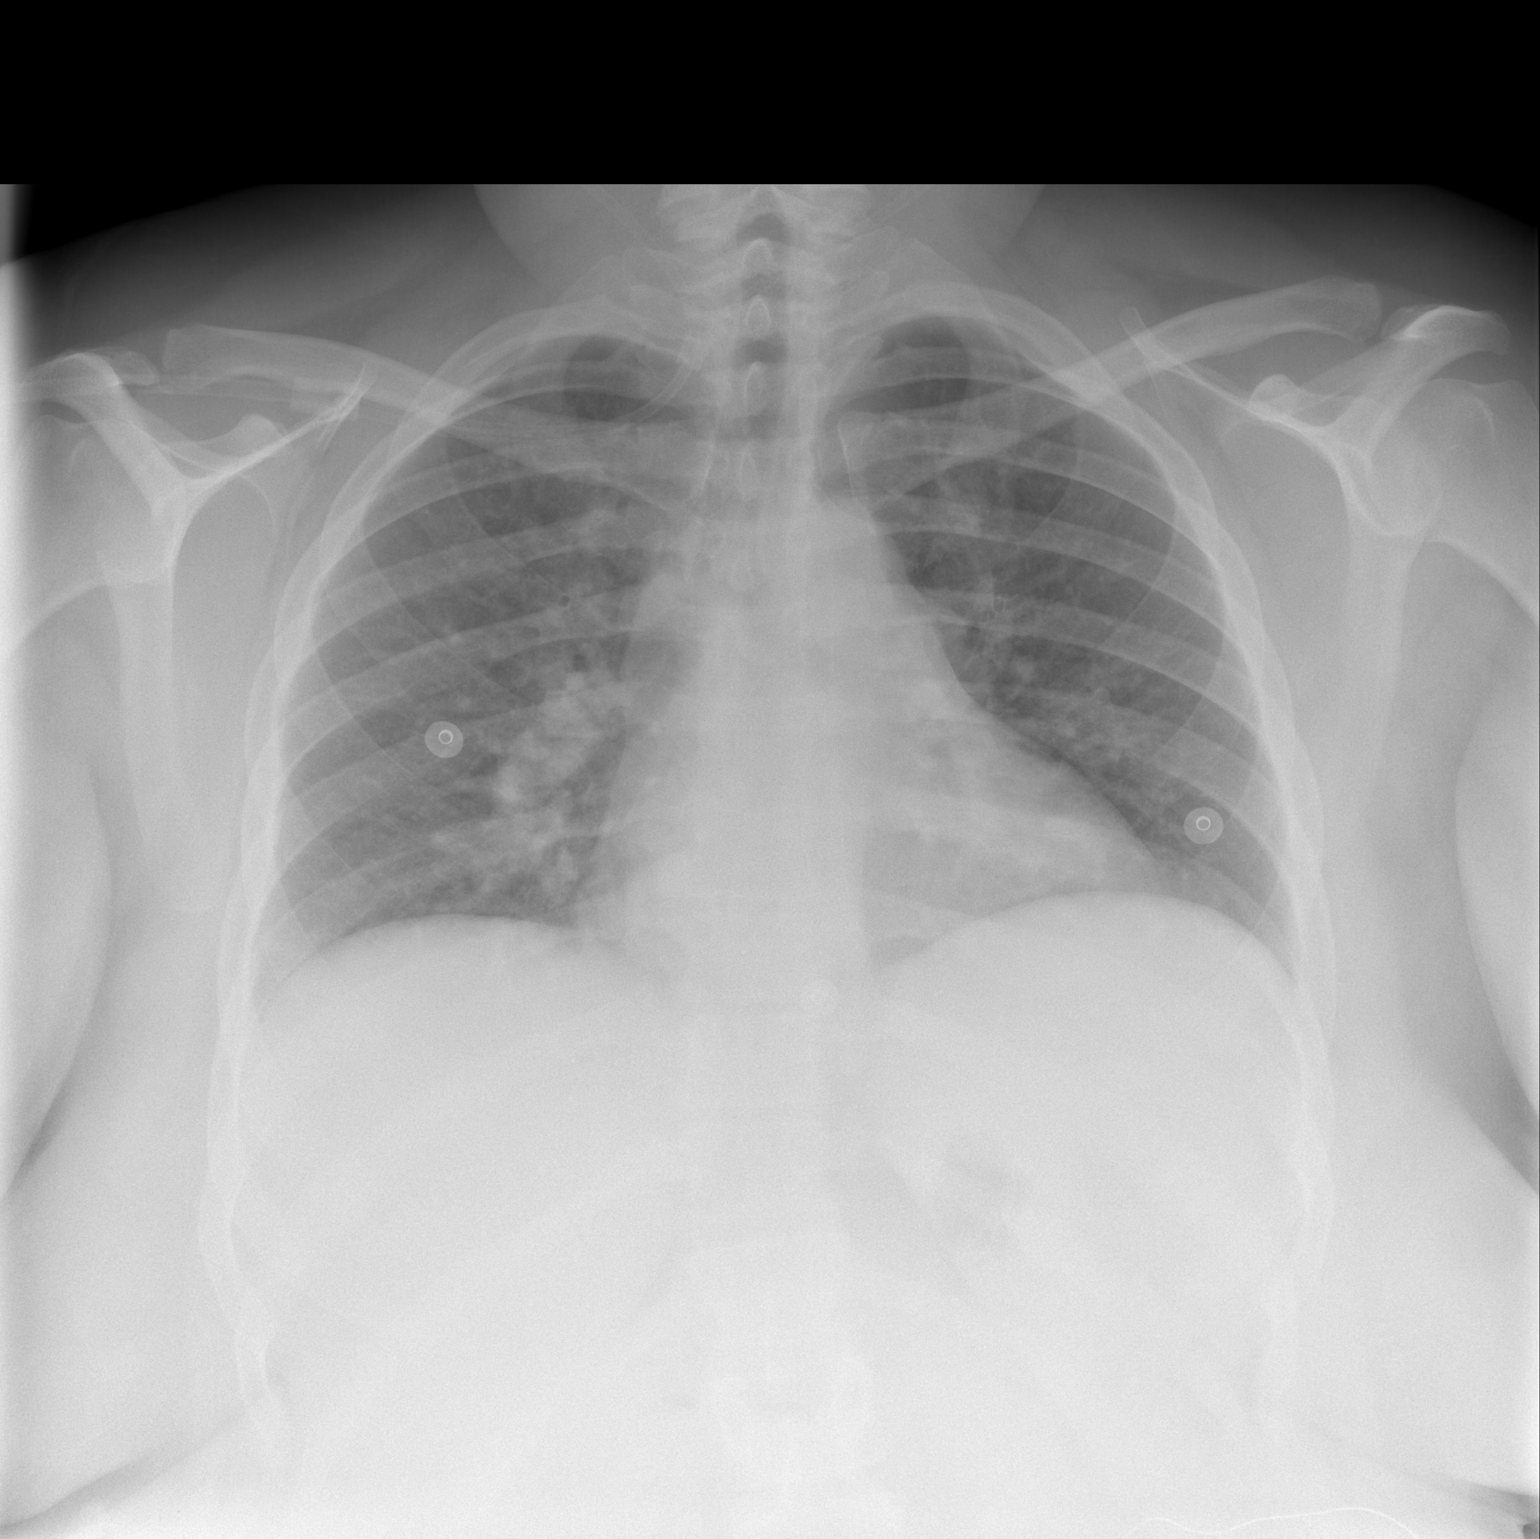

[w chest lat *]
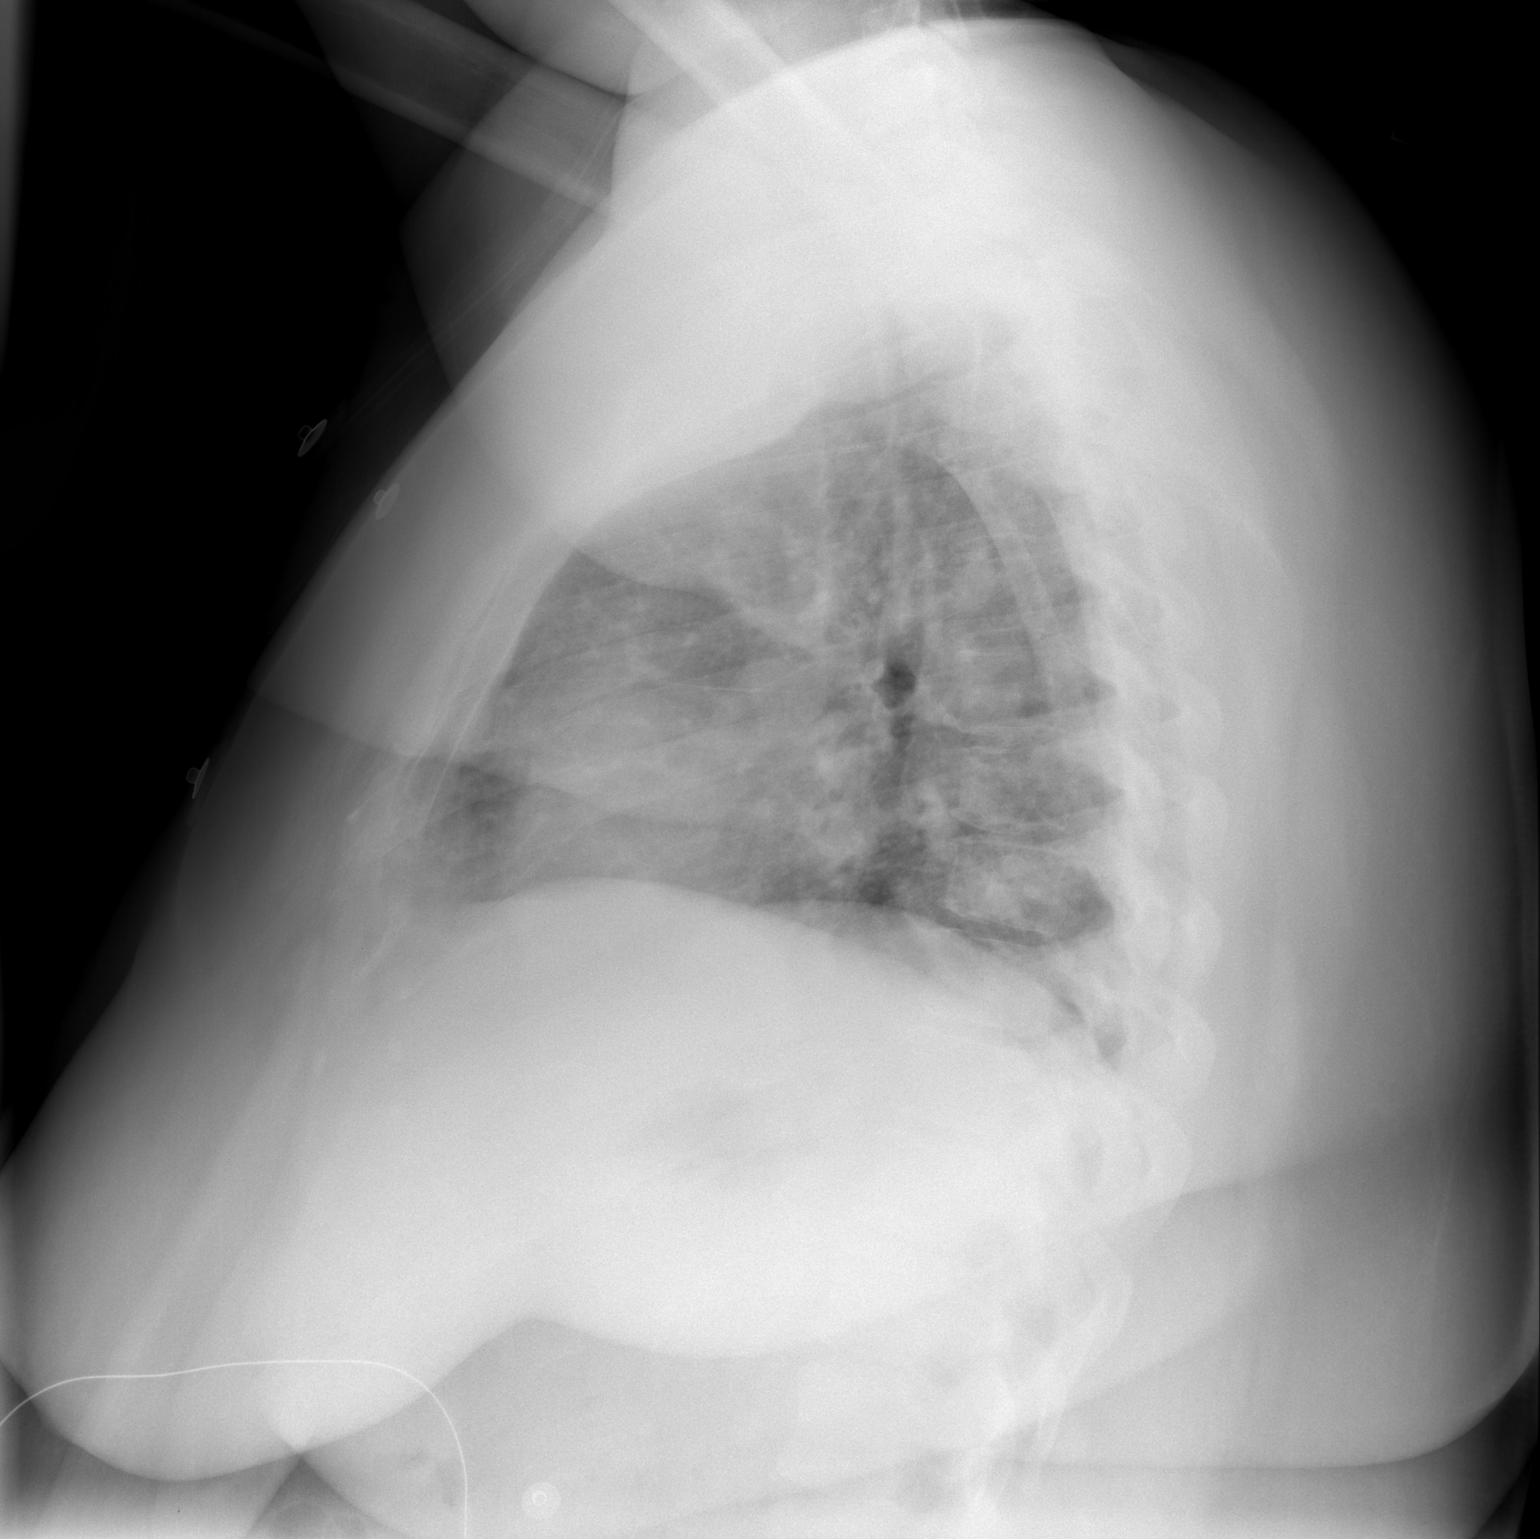

[2 of 2 positions shown; findings below may reference images not displayed]

FINDINGS: Lung volumes are low.  There is some peribronchial
thickening.  No focal airspace disease or effusion.  Heart size
upper normal.
IMPRESSION: Mild bronchitic change.  No focal process.

## 2009-06-17 IMAGING — CR DG CHEST 2V
2 series · 2 of 2 positions shown · non-contrast
Comparison: 10/11/2007

CLINICAL DATA: Shortness of breath

CHEST - 2 VIEW

[w chest pa]
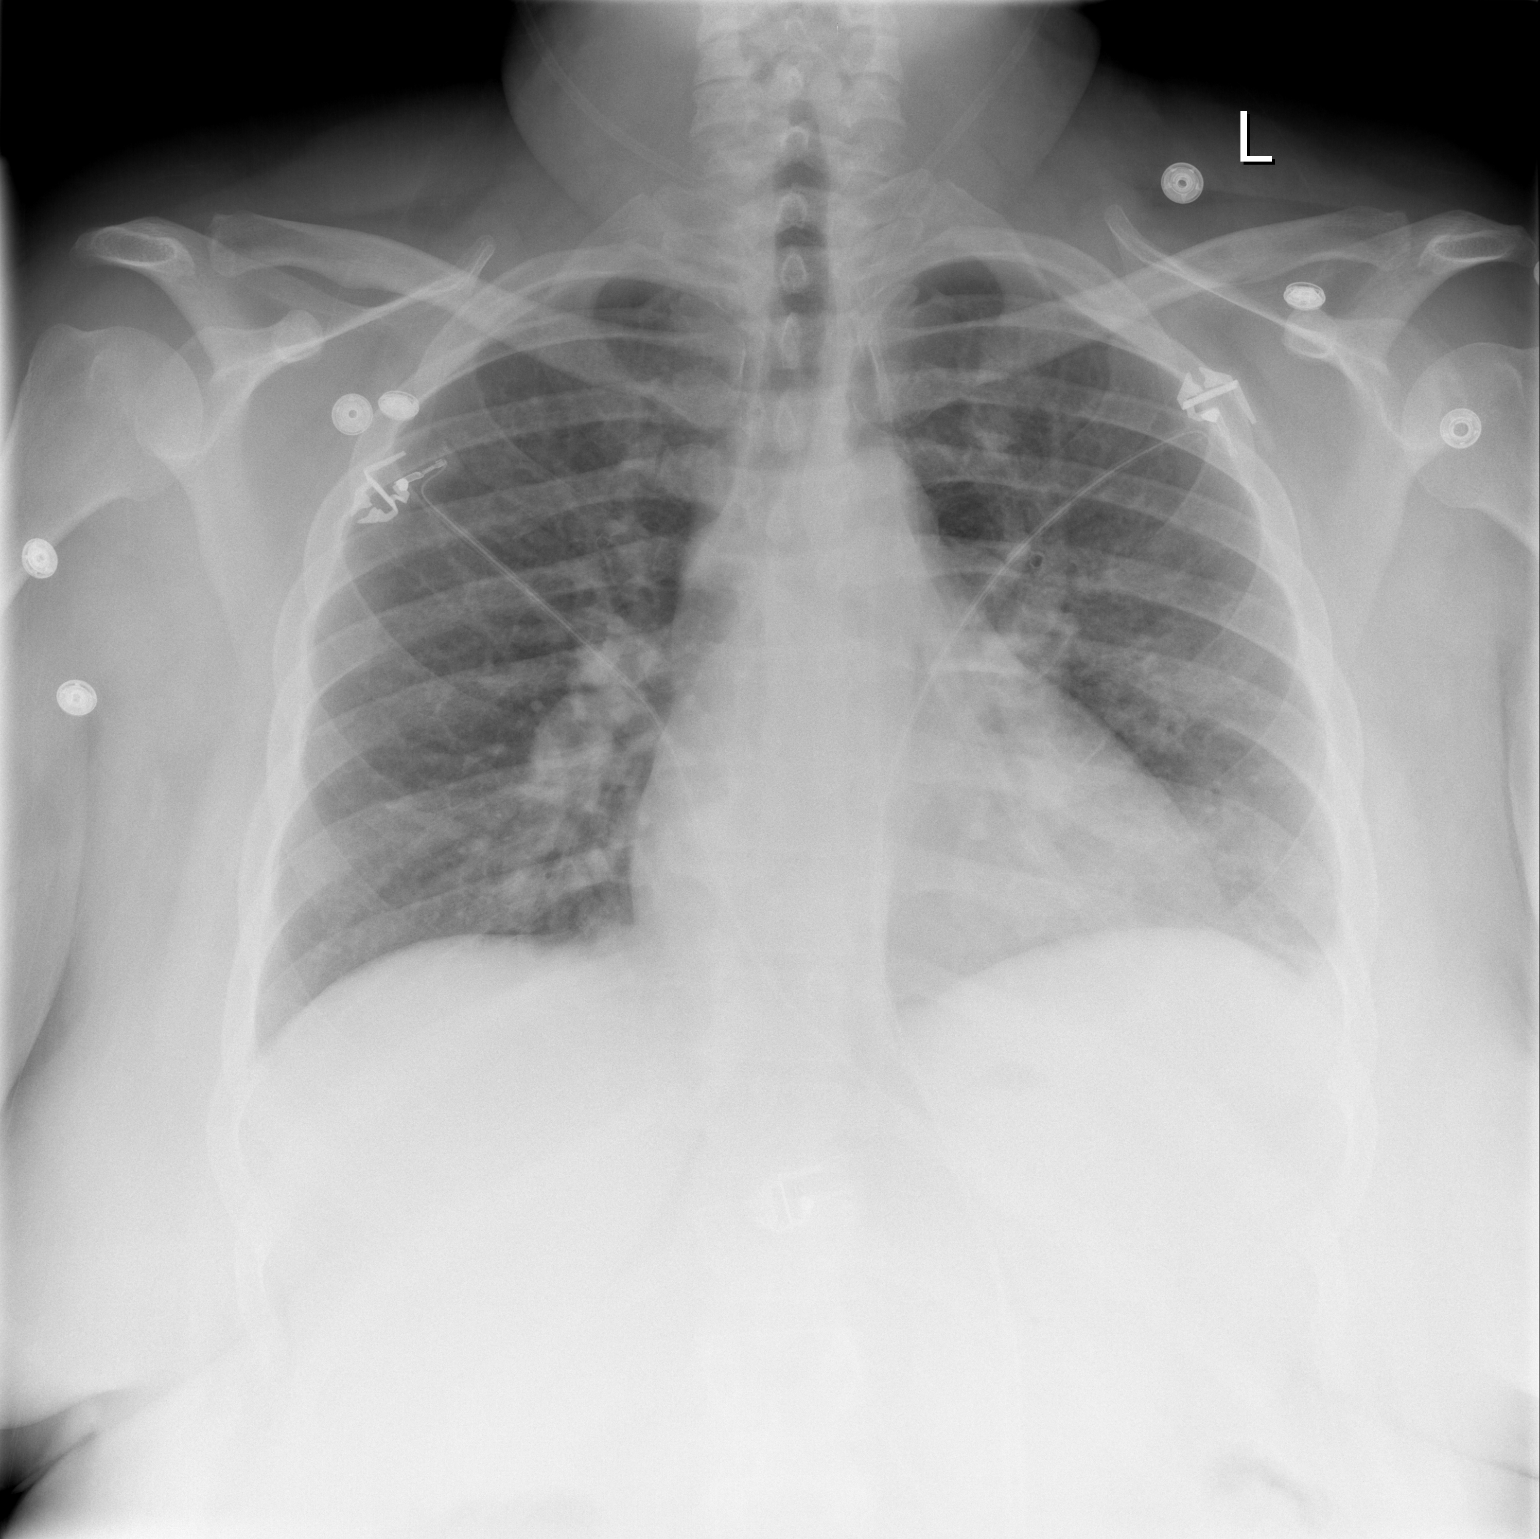

[w chest lat]
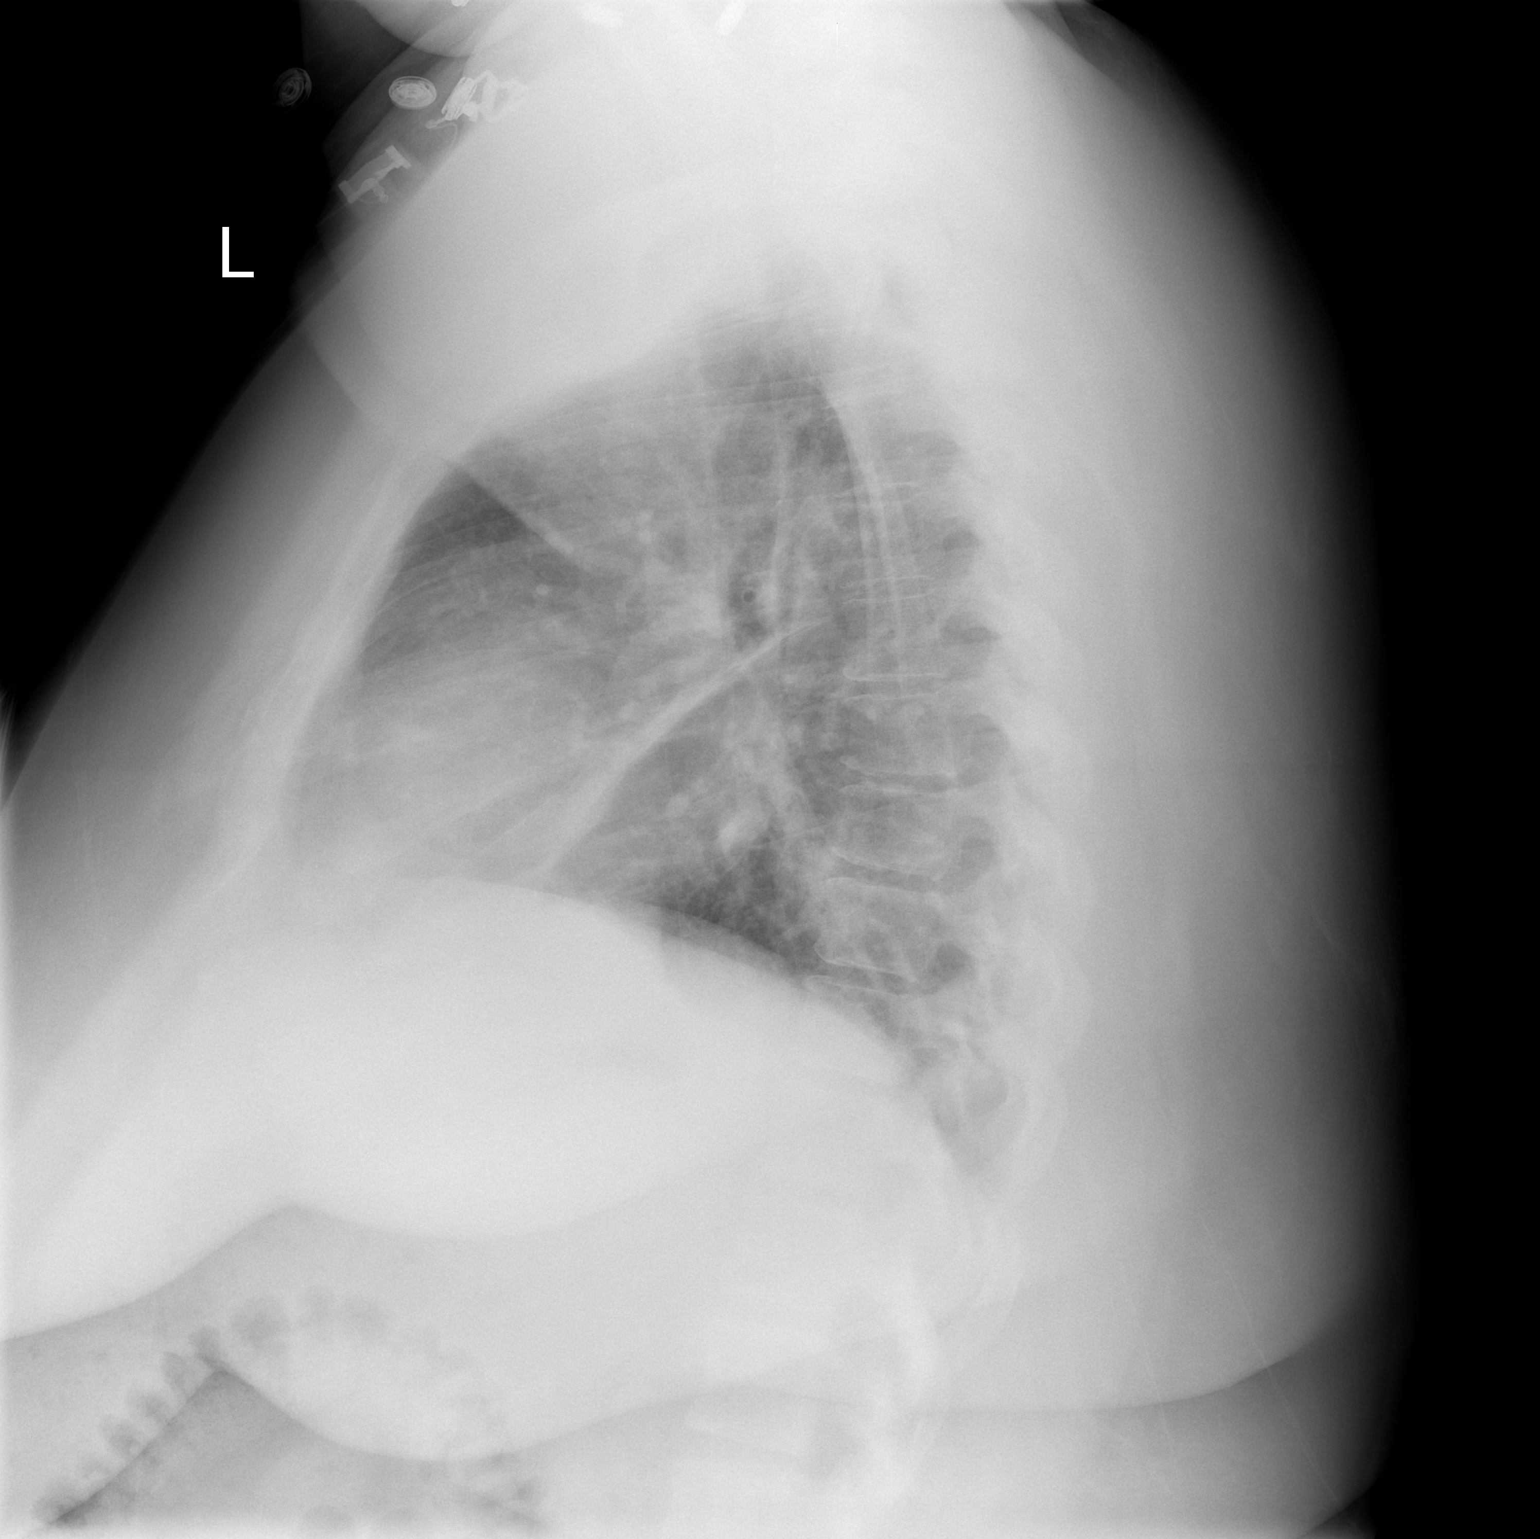

[2 of 2 positions shown; findings below may reference images not displayed]

FINDINGS: Cardiac leads overlie the chest.  Heart size is at the
upper limits of normal.  Lung volumes are low with chronic
bronchitic change and bibasilar atelectasis.
IMPRESSION: Chronic bronchitic change and bibasilar atelectasis/low volumes.

## 2009-06-20 IMAGING — CT CT HEAD W/O CM
1 of 2 series · 13 of 30 positions shown, 17 images · non-contrast
Comparison: 05/08/2007

CLINICAL DATA: Headache, nausea

CT HEAD WITHOUT CONTRAST
TECHNIQUE: Contiguous axial images were obtained from the base of
the skull through the vertex without contrast.

[Series 2: brain · axial · 0.47mm/px · z∈[+134,+257]mm · 13 of 36 slices shown, 17 images]
[im 3/36  brain]
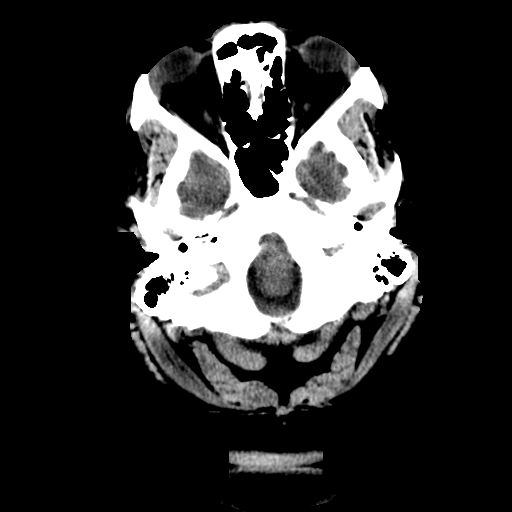
[im 3/36  bone]
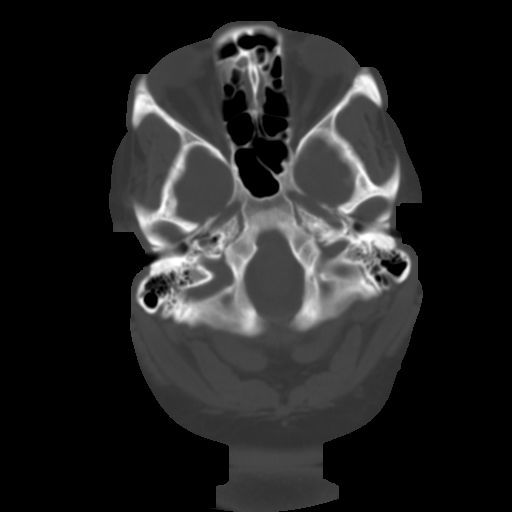
[im 6/36  brain]
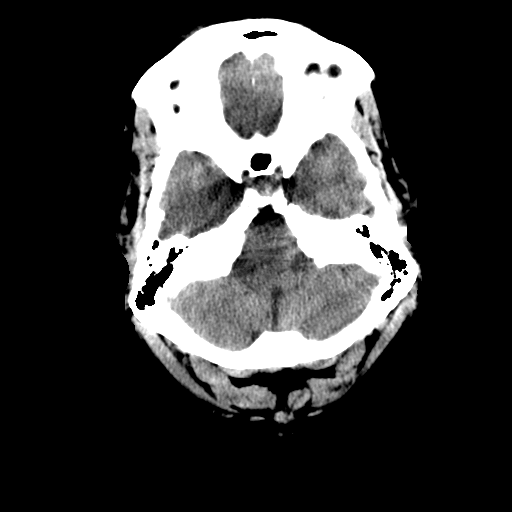
[im 8/36  brain]
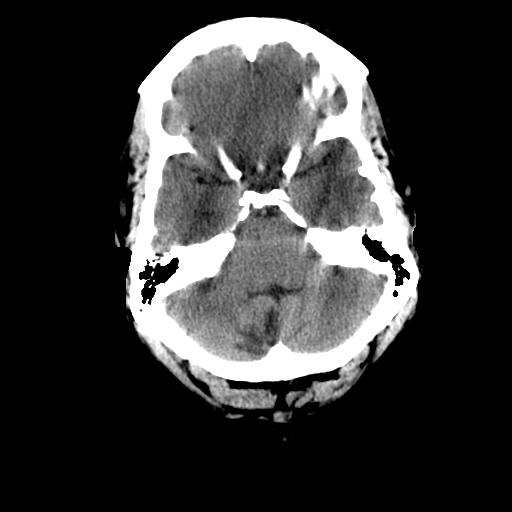
[im 11/36  brain]
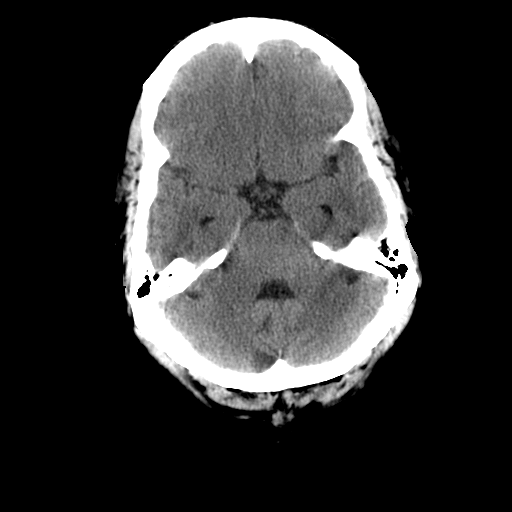
[im 13/36  brain]
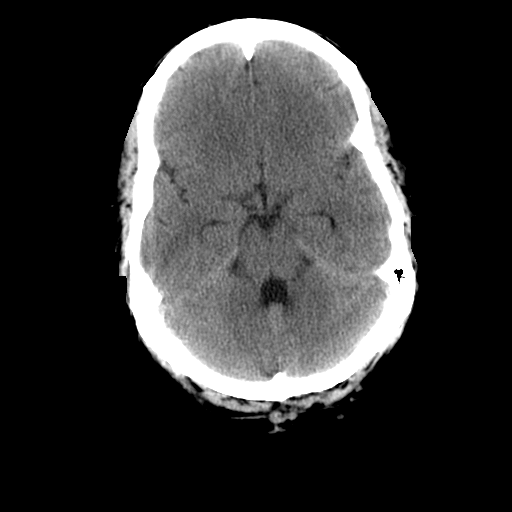
[im 13/36  bone]
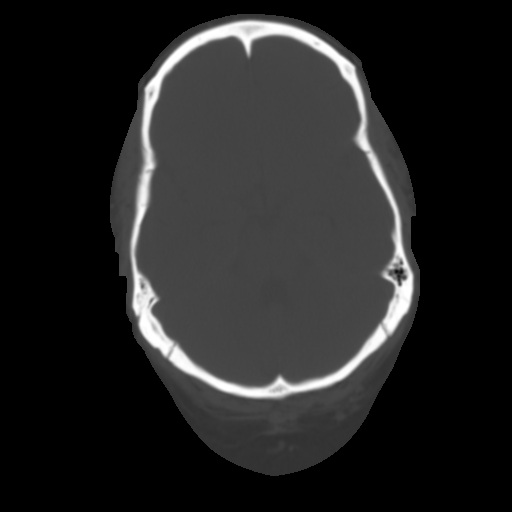
[im 16/36  brain]
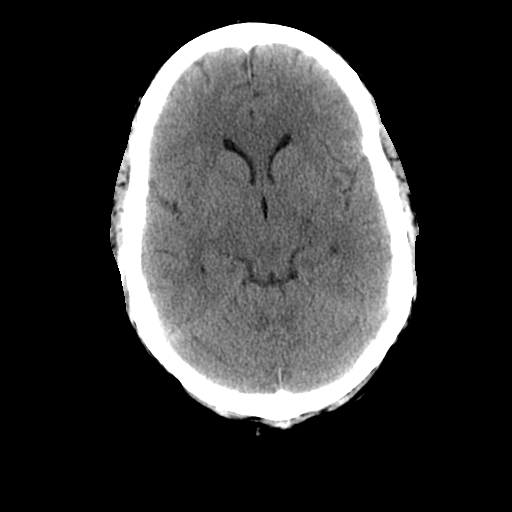
[im 18/36  brain]
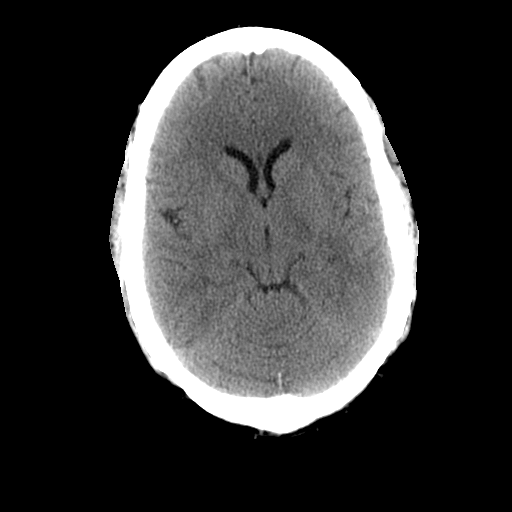
[im 21/36  brain]
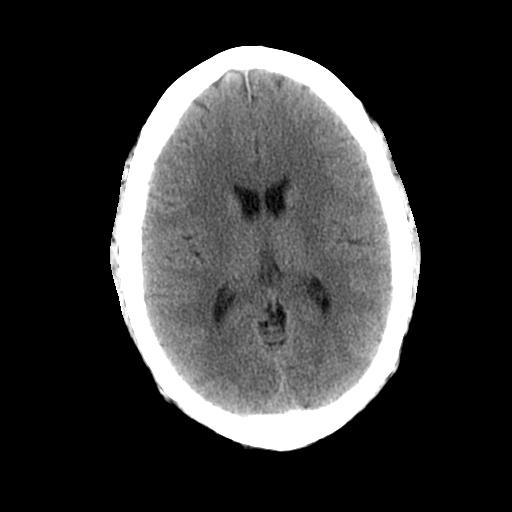
[im 23/36  brain]
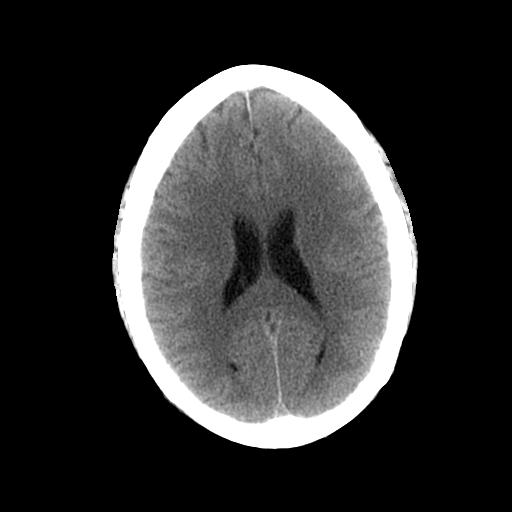
[im 23/36  bone]
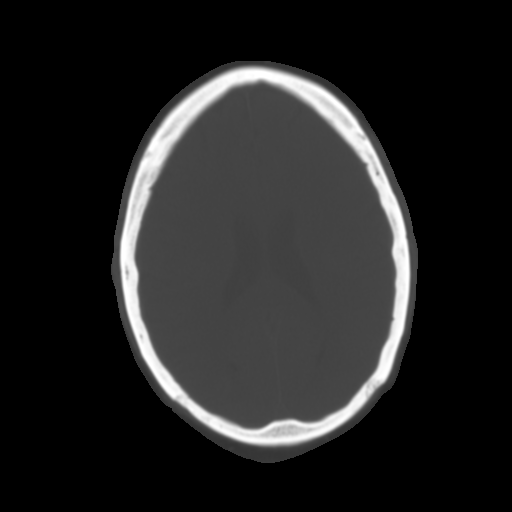
[im 26/36  brain]
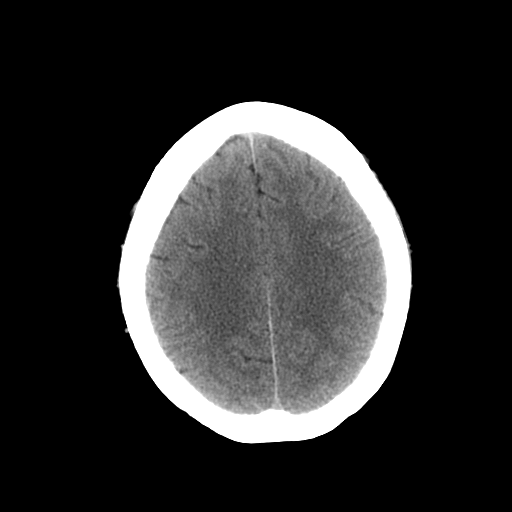
[im 28/36  brain]
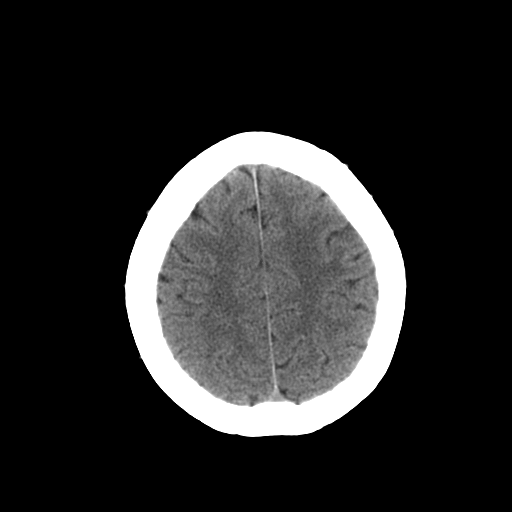
[im 31/36  brain]
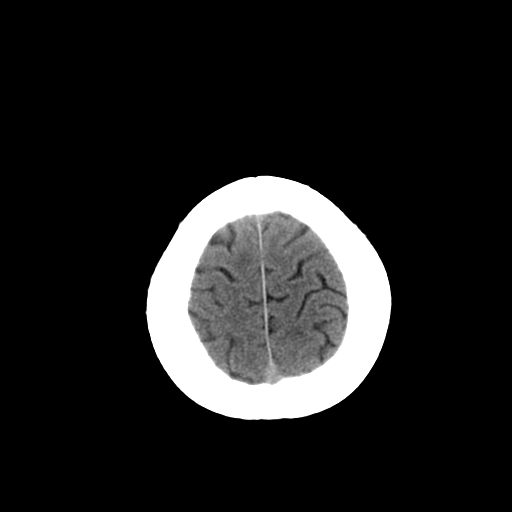
[im 33/36  brain]
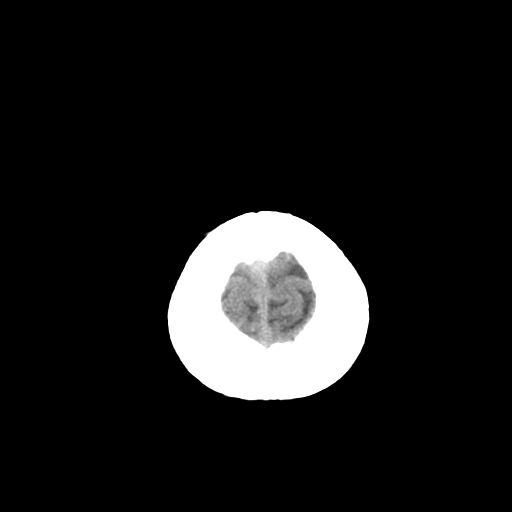
[im 33/36  bone]
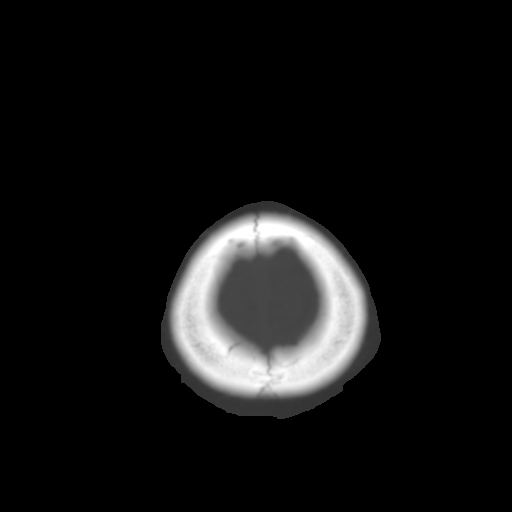

[13 of 30 positions shown; findings below may reference images not displayed]

FINDINGS: There is no evidence of acute intracranial hemorrhage,
brain edema, mass,  mass effect, or midline shift. Acute infarct
may be inapparent on noncontrast CT.  No other intra-axial
abnormalities are seen, and the ventricles and sulci are within
normal limits in size and symmetry.   No abnormal extra-axial fluid
collections or masses are identified.  No significant calvarial
abnormality.
IMPRESSION: 1.  Negative for bleed or other acute intracranial process.

## 2009-06-22 ENCOUNTER — Telehealth: Payer: Self-pay | Admitting: Internal Medicine

## 2009-06-24 ENCOUNTER — Telehealth: Payer: Self-pay | Admitting: Internal Medicine

## 2009-07-05 ENCOUNTER — Encounter: Payer: Self-pay | Admitting: Internal Medicine

## 2009-07-07 ENCOUNTER — Telehealth: Payer: Self-pay | Admitting: *Deleted

## 2009-07-23 ENCOUNTER — Telehealth: Payer: Self-pay | Admitting: Internal Medicine

## 2009-07-25 IMAGING — CT CT PELVIS W/ CM
2 of 5 series · 17 of 46 positions shown, 19 images · IV contrast (omnipaque)
Comparison: 01/13/2005

CT ABDOMEN

CLINICAL DATA: Right lower quadrant pain, nausea, vomiting

CT ABDOMEN AND PELVIS WITH CONTRAST
TECHNIQUE: Multidetector CT imaging of the abdomen and pelvis was
performed using the standard protocol following bolus
administration of intravenous contrast.
Contrast: 100 ml Omnipaque 300

[Series 2: abd_pel 5.0 b40f st · axial · 0.84mm/px · z∈[+730,+1140]mm · 14 of 92 slices shown, 16 images]
[im 5/92  soft-tissue]
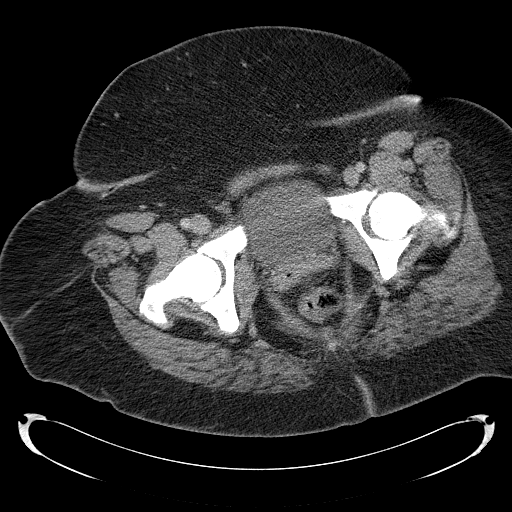
[im 5/92  bone]
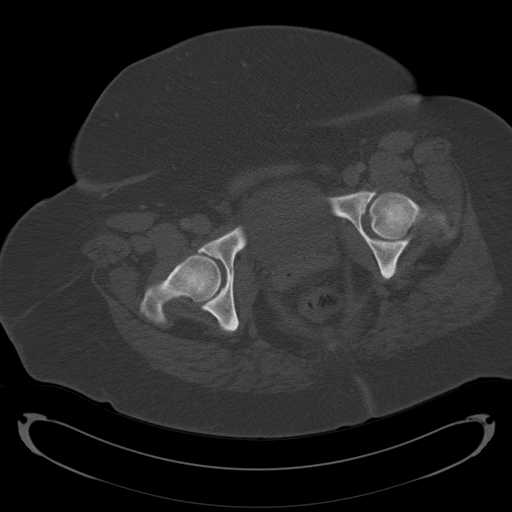
[im 14/92  soft-tissue]
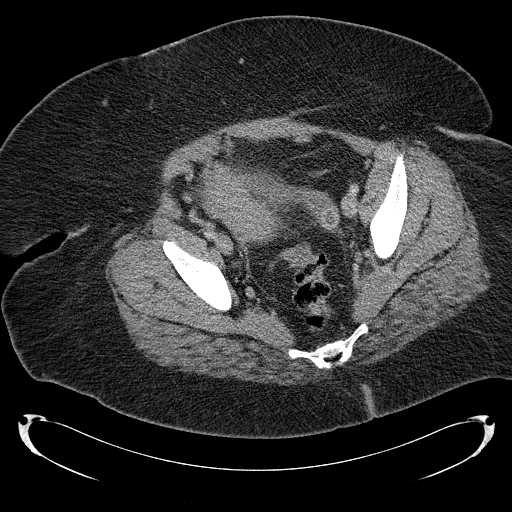
[im 19/92  soft-tissue]
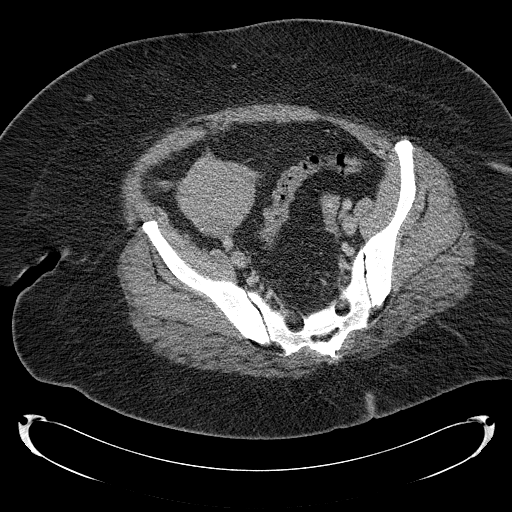
[im 23/92  soft-tissue]
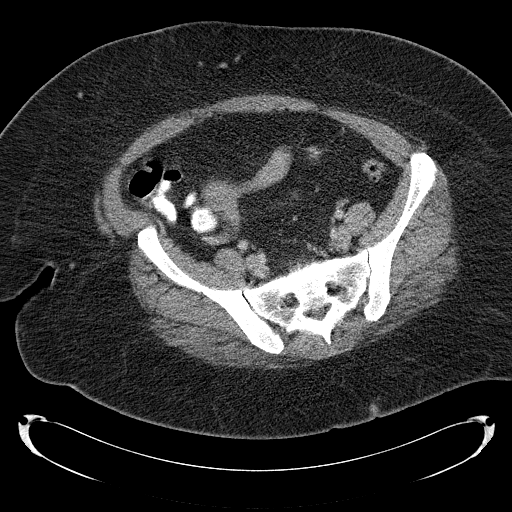
[im 32/92  soft-tissue]
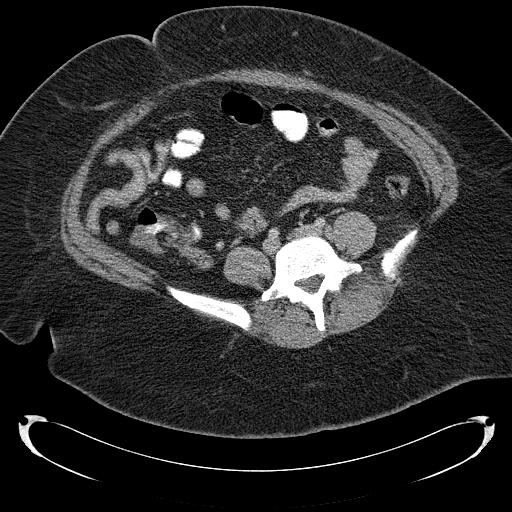
[im 37/92  soft-tissue]
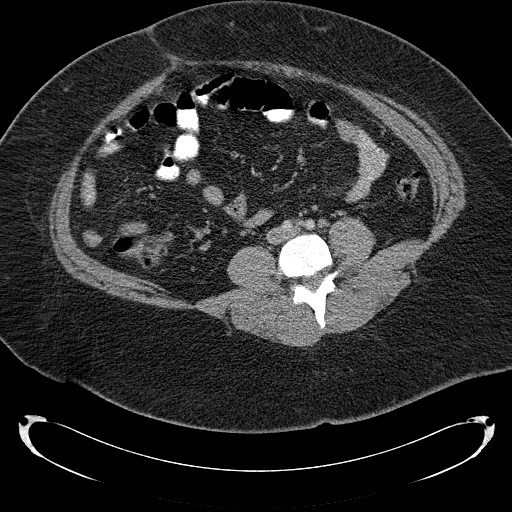
[im 41/92  soft-tissue]
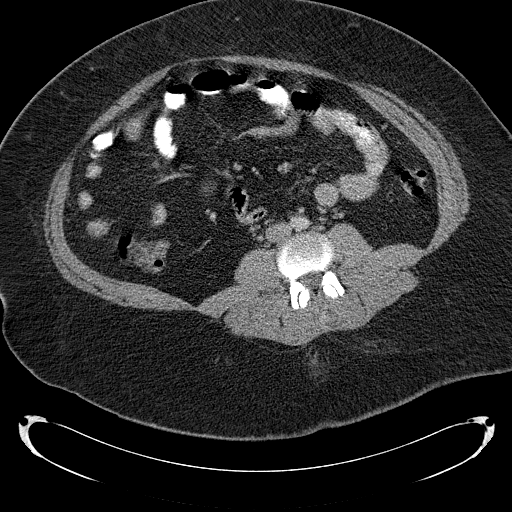
[im 51/92  soft-tissue]
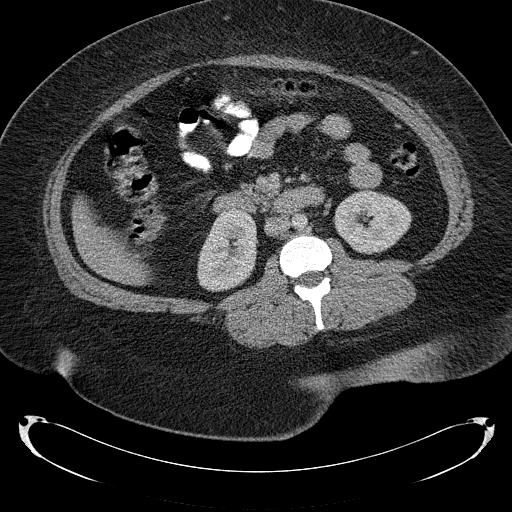
[im 55/92  soft-tissue]
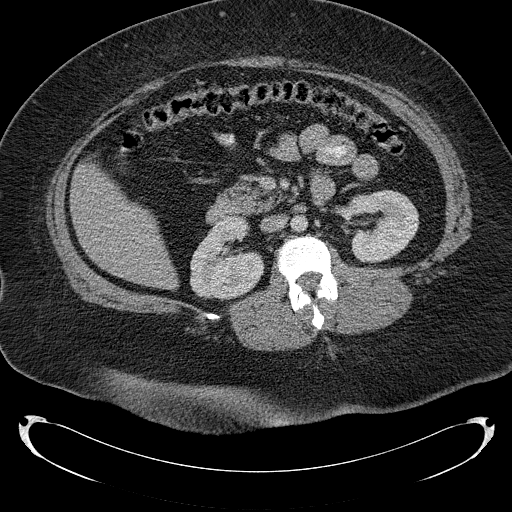
[im 55/92  bone]
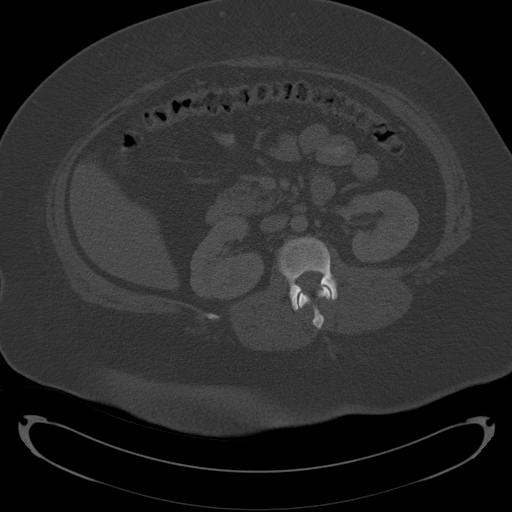
[im 60/92  soft-tissue]
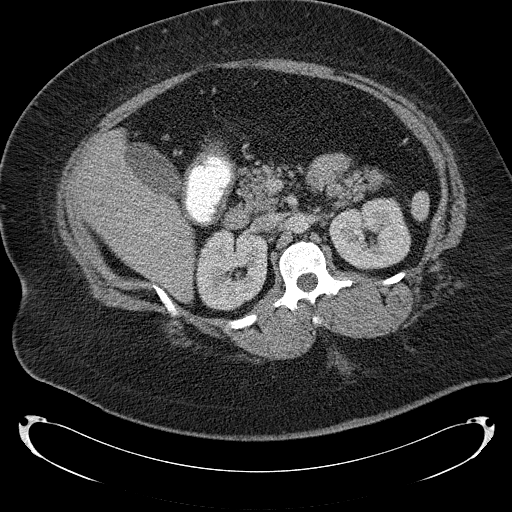
[im 69/92  soft-tissue]
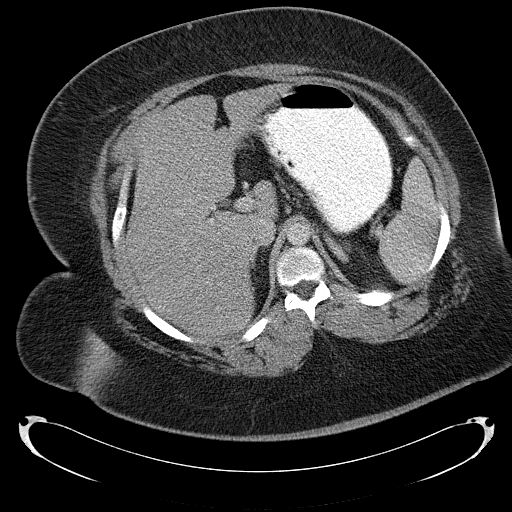
[im 73/92  soft-tissue]
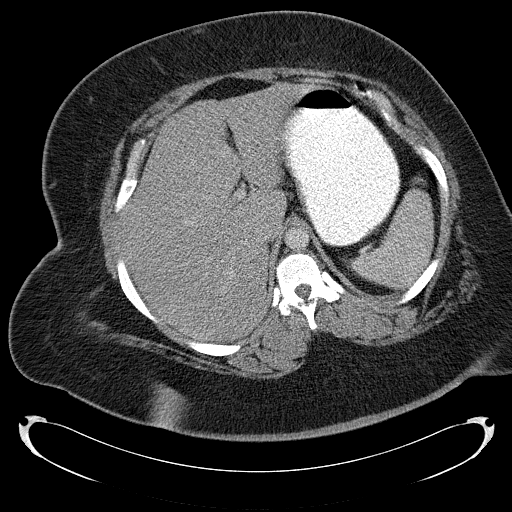
[im 78/92  soft-tissue]
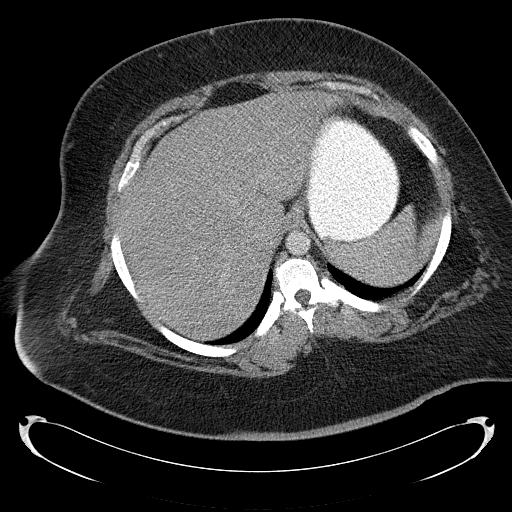
[im 87/92  soft-tissue]
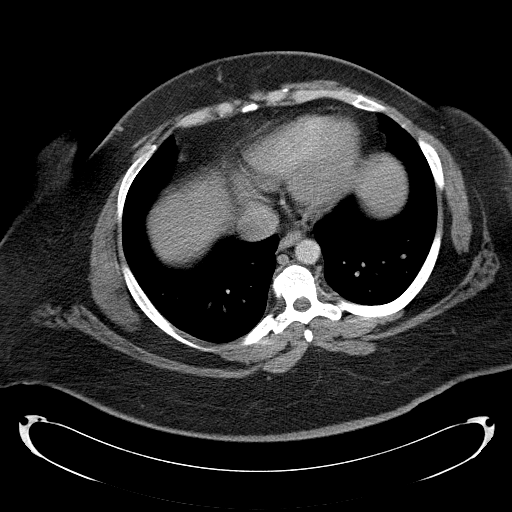

[Series 602: <mpr thick range> · coronal · 0.92mm/px · 3 of 87 slices shown]
[im 29/87  soft-tissue]
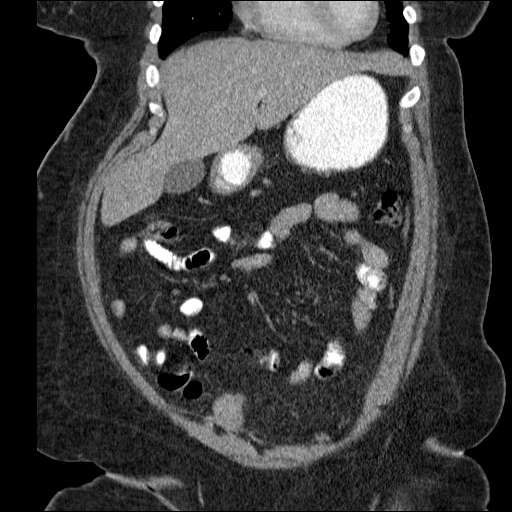
[im 39/87  soft-tissue]
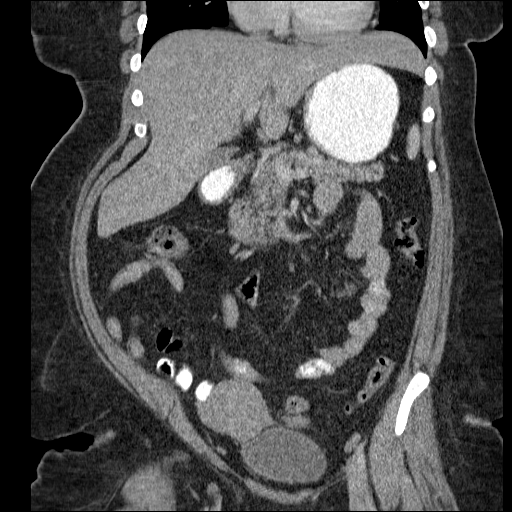
[im 48/87  soft-tissue]
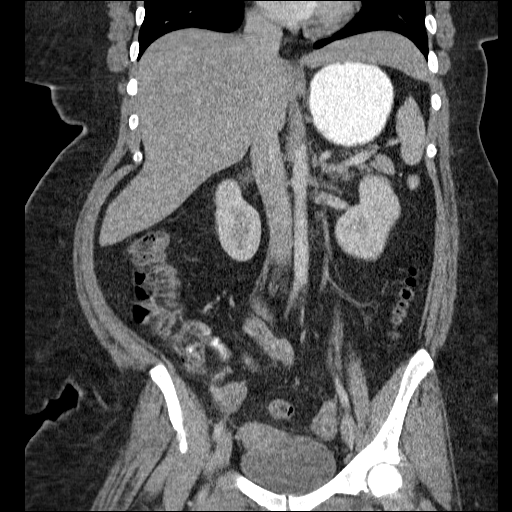

[17 of 46 positions shown; findings below may reference images not displayed]

FINDINGS: Heart is borderline enlarged.  Lung bases clear.  No
effusions.

There is diffuse fatty infiltration of the liver.  No focal lesions
in the liver, spleen, pancreas, adrenals, or kidneys.  Gallbladder
and bowel grossly unremarkable.  No free fluid, free air, or
adenopathy.
IMPRESSION: No acute findings in the abdomen.

Fatty liver.

CT PELVIS
FINDINGS: Appendix is visualized and is normal.  Uterus and adnexa
grossly unremarkable.  Pelvic large and small bowel grossly
unremarkable.  No free fluid, free air, or adenopathy.
IMPRESSION: Normal appendix.

No acute findings in the pelvis.

## 2009-08-16 IMAGING — MR MR LUMBAR SPINE W/O CM
7 of 16 series · 19 of 48 positions shown · non-contrast
Comparison: CT chest 05/08/2007

MRI THORACIC SPINE

CLINICAL DATA: Extreme mid back pain and low back pain

MRI THORACIC AND LUMBAR SPINE WITHOUT CONTRAST
TECHNIQUE: Multiplanar and multiecho pulse sequences of the
thoracic and lumbar spine, were obtained according to standard
protocol without intravenous contrast. The patient refused
contrast.

[Series 2: T2 · sagittal · 3.0mm · 0.43mm/px · 3 of 12 slices shown (1 of 4)]
[im 1/12]
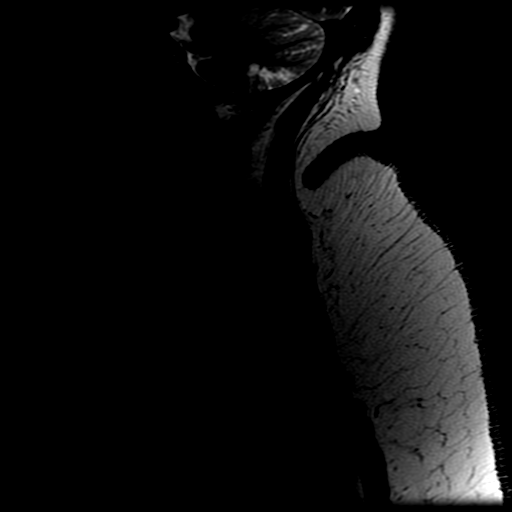
[im 6/12]
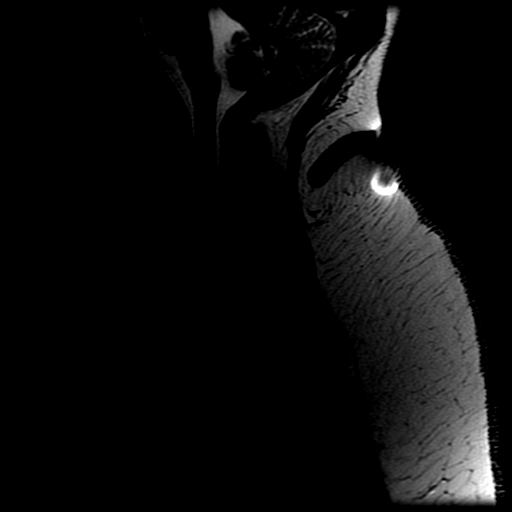
[im 12/12]
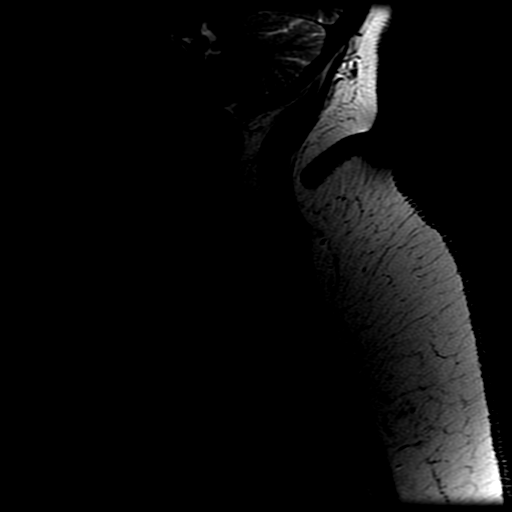

[Series 3: T2 · sagittal · 3.0mm · 0.62mm/px · 2 of 14 slices shown (2 of 4)]
[im 1/14]
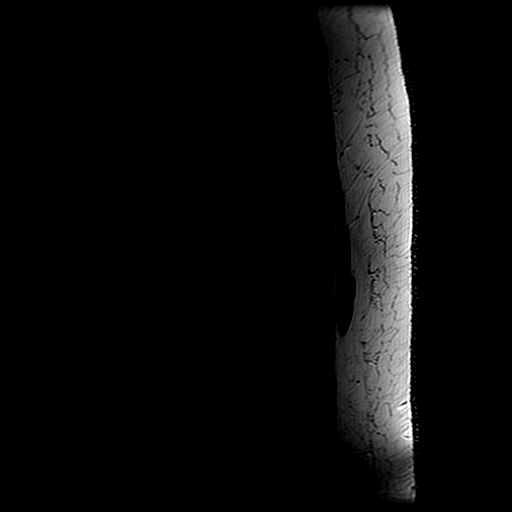
[im 14/14]
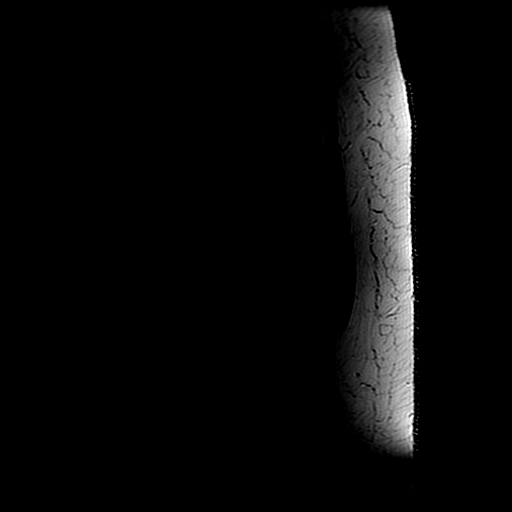

[Series 4: T1 · sagittal · 3.0mm · 0.62mm/px · 2 of 14 slices shown (1 of 3)]
[im 1/14]
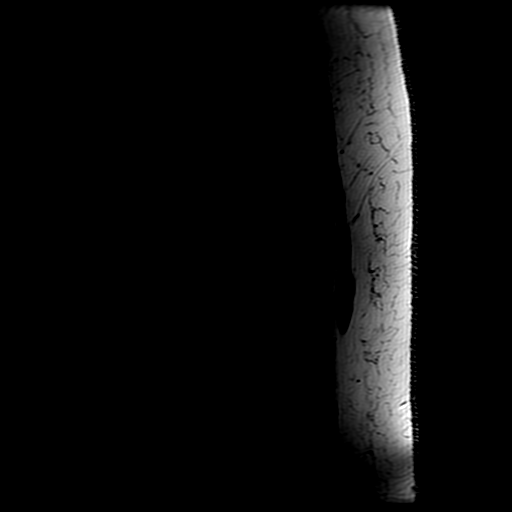
[im 14/14]
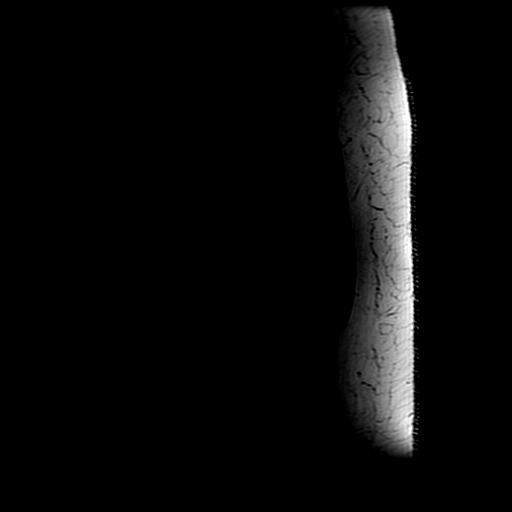

[Series 8: T1 · axial · 4.0mm · 0.39mm/px · z∈[-122,+102]mm · 4 of 27 slices shown (2 of 3)]
[im 1/27]
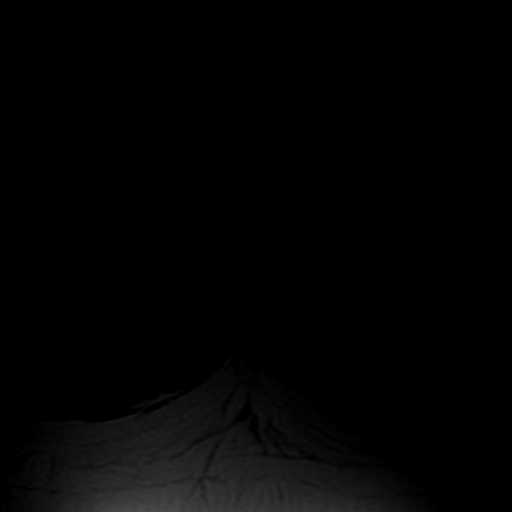
[im 9/27]
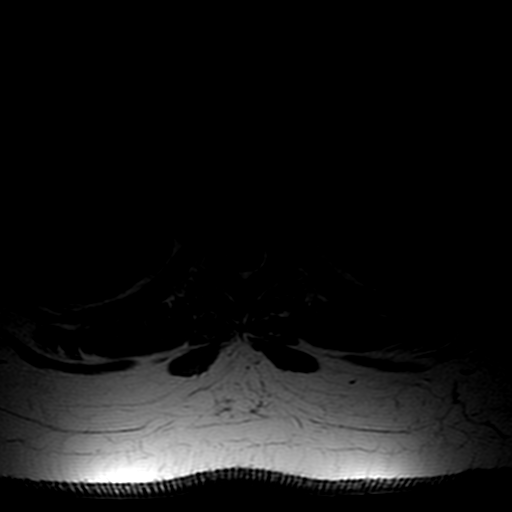
[im 18/27]
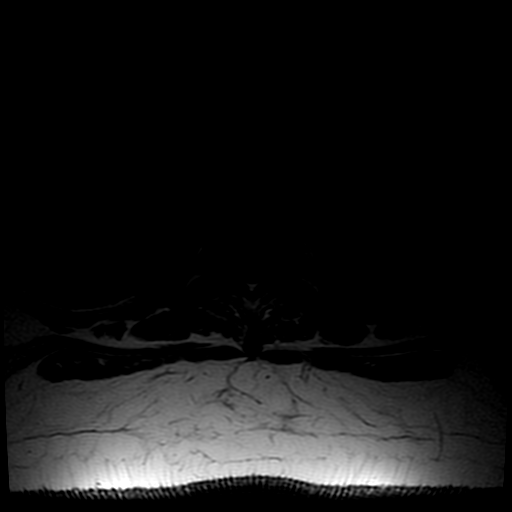
[im 27/27]
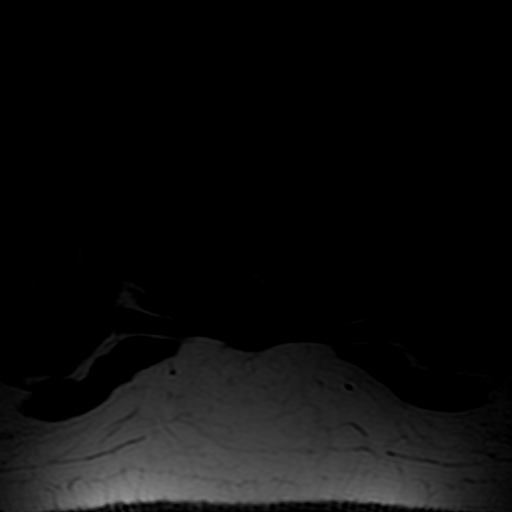

[Series 11: T1 · sagittal · 4.0mm · 0.51mm/px · 2 of 12 slices shown (3 of 3)]
[im 1/12]
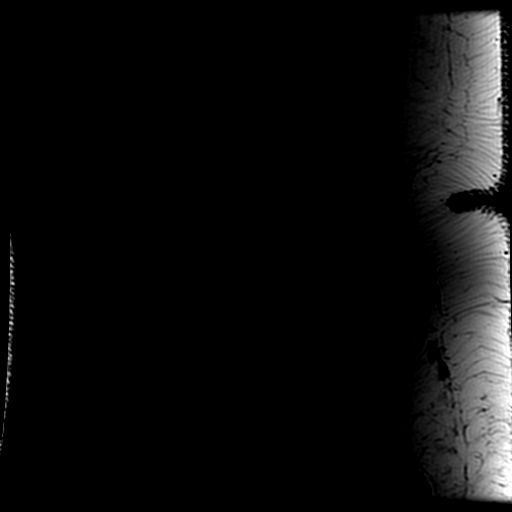
[im 12/12]
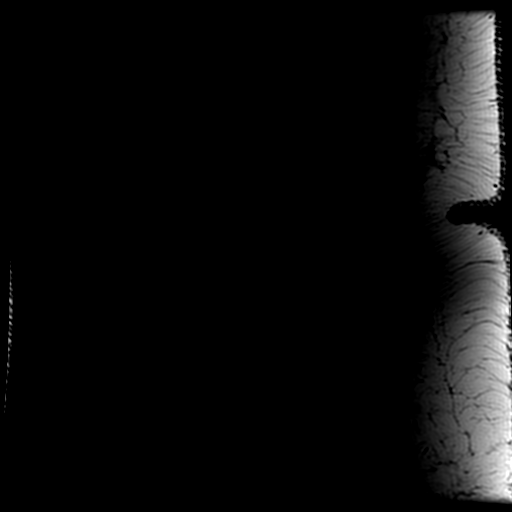

[Series 12: T2 · sagittal · 4.0mm · 0.51mm/px · 2 of 12 slices shown (3 of 4)]
[im 1/12]
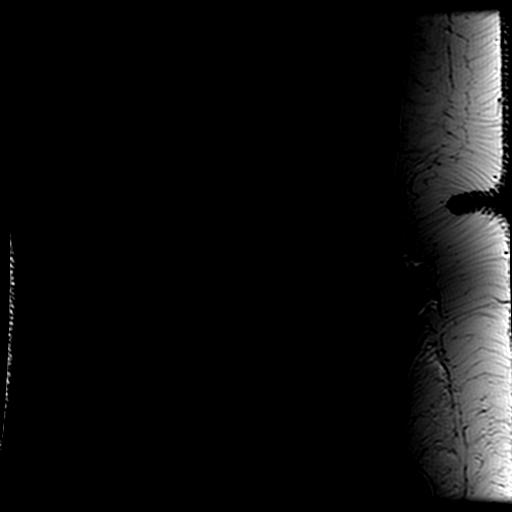
[im 12/12]
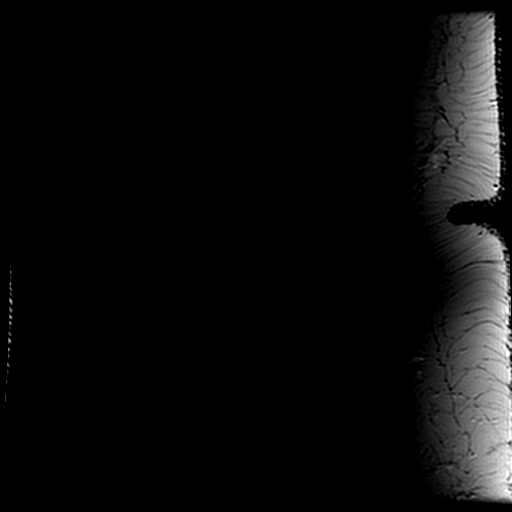

[Series 14: T2 · axial · 4.0mm · 0.35mm/px · z∈[-373,-193]mm · 4 of 24 slices shown (4 of 4)]
[im 1/24]
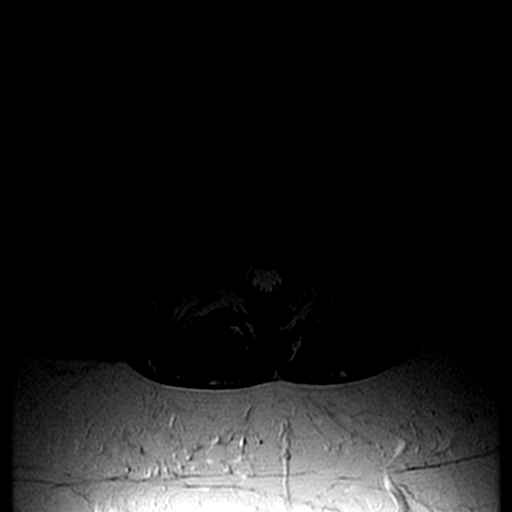
[im 8/24]
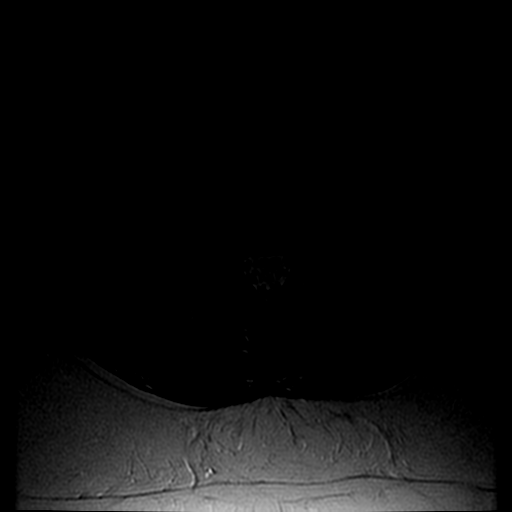
[im 16/24]
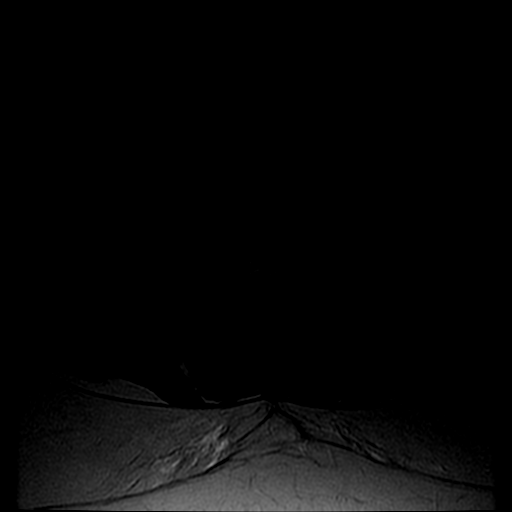
[im 24/24]
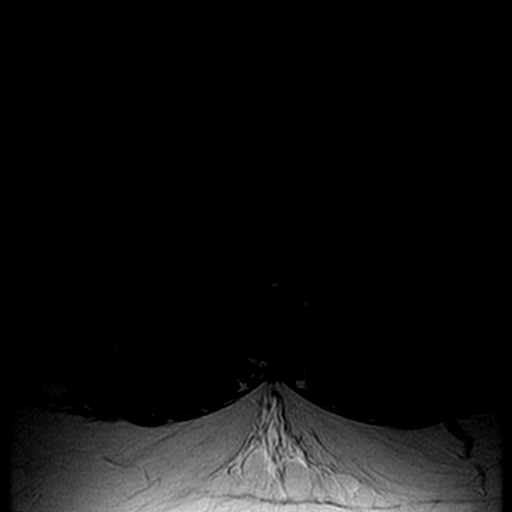

[19 of 48 positions shown; findings below may reference images not displayed]

FINDINGS: Anatomic alignment.  Marrow signal homogeneous.  Cord
normal in size and signal.  Abundant extraspinal soft tissue
consistent with morbid obesity.

Examination of the individual disc spaces shows a shallow central
protrusion at T7-8, non compressive.  This soft tissue density was
identified from previous CT and appears grossly unchanged.
IMPRESSION: Shallow central protrusion T7-8, non compressive.  Otherwise
negative

MRI LUMBAR SPINE
FINDINGS: Overall image quality reduced due to the patient's large
size.

Anatomic alignment.  Marrow signal homogeneous.  Good disc height
throughout.  Mild disc desiccation L5-S1 and L4-5.

The disc spaces at L3-4 and above are normal.

At L4-5, there is a tiny central extruded fragment upwards.  This
is non compressive.

At L5-S1,  there is a shallow central protrusion which contacts the
ventral aspect of the thecal sac.  There is no S1 nerve root
displacement.

Generalized lower lumbar facet arthropathy is noted without
significant stenosis or foraminal narrowing.
IMPRESSION: Mild two level disc disease at L4-5 and L5-S1, non compressive.

Lower lumbar facet arthropathy.

## 2009-08-24 ENCOUNTER — Telehealth: Payer: Self-pay | Admitting: Internal Medicine

## 2009-08-25 ENCOUNTER — Telehealth: Payer: Self-pay | Admitting: Internal Medicine

## 2009-08-26 ENCOUNTER — Encounter: Payer: Self-pay | Admitting: Internal Medicine

## 2009-08-30 ENCOUNTER — Ambulatory Visit: Payer: Self-pay | Admitting: Internal Medicine

## 2009-08-30 DIAGNOSIS — E785 Hyperlipidemia, unspecified: Secondary | ICD-10-CM | POA: Insufficient documentation

## 2009-08-30 DIAGNOSIS — E1142 Type 2 diabetes mellitus with diabetic polyneuropathy: Secondary | ICD-10-CM

## 2009-08-30 LAB — CONVERTED CEMR LAB
Blood Glucose, Fingerstick: 280
Hgb A1c MFr Bld: >14 %

## 2009-09-02 ENCOUNTER — Emergency Department (HOSPITAL_COMMUNITY): Admission: EM | Admit: 2009-09-02 | Discharge: 2009-09-02 | Payer: Self-pay | Admitting: Emergency Medicine

## 2009-09-03 LAB — CONVERTED CEMR LAB
Amphetamine Screen, Ur: NEGATIVE
Benzodiazepines.: NEGATIVE
Marijuana Metabolite: NEGATIVE
Methadone: NEGATIVE
Opiates: NEGATIVE

## 2009-09-24 ENCOUNTER — Telehealth: Payer: Self-pay | Admitting: Internal Medicine

## 2009-09-28 ENCOUNTER — Encounter: Payer: Self-pay | Admitting: Internal Medicine

## 2009-09-28 ENCOUNTER — Ambulatory Visit: Payer: Self-pay | Admitting: Internal Medicine

## 2009-09-28 LAB — CONVERTED CEMR LAB
Marijuana Metabolite: NEGATIVE
Opiates: NEGATIVE
Phencyclidine (PCP): NEGATIVE
Propoxyphene: NEGATIVE

## 2009-09-29 ENCOUNTER — Telehealth: Payer: Self-pay | Admitting: Internal Medicine

## 2009-09-30 ENCOUNTER — Telehealth: Payer: Self-pay | Admitting: Internal Medicine

## 2009-09-30 ENCOUNTER — Ambulatory Visit: Payer: Self-pay | Admitting: Endocrinology

## 2009-10-01 ENCOUNTER — Ambulatory Visit: Payer: Self-pay | Admitting: Internal Medicine

## 2009-10-01 DIAGNOSIS — G609 Hereditary and idiopathic neuropathy, unspecified: Secondary | ICD-10-CM | POA: Insufficient documentation

## 2009-10-13 ENCOUNTER — Encounter: Payer: Self-pay | Admitting: Internal Medicine

## 2009-10-26 ENCOUNTER — Telehealth: Payer: Self-pay | Admitting: Internal Medicine

## 2009-11-01 ENCOUNTER — Emergency Department (HOSPITAL_COMMUNITY): Admission: EM | Admit: 2009-11-01 | Discharge: 2009-11-01 | Payer: Self-pay | Admitting: Emergency Medicine

## 2009-11-03 ENCOUNTER — Telehealth: Payer: Self-pay | Admitting: *Deleted

## 2009-11-15 ENCOUNTER — Telehealth: Payer: Self-pay | Admitting: Internal Medicine

## 2009-11-16 ENCOUNTER — Telehealth: Payer: Self-pay | Admitting: Internal Medicine

## 2009-11-21 ENCOUNTER — Observation Stay (HOSPITAL_COMMUNITY): Admission: EM | Admit: 2009-11-21 | Discharge: 2009-11-24 | Payer: Self-pay | Admitting: Emergency Medicine

## 2009-11-21 ENCOUNTER — Encounter: Payer: Self-pay | Admitting: Internal Medicine

## 2009-11-21 ENCOUNTER — Ambulatory Visit: Payer: Self-pay | Admitting: Internal Medicine

## 2009-11-22 DIAGNOSIS — D509 Iron deficiency anemia, unspecified: Secondary | ICD-10-CM | POA: Insufficient documentation

## 2009-11-24 ENCOUNTER — Encounter: Payer: Self-pay | Admitting: Internal Medicine

## 2009-11-25 ENCOUNTER — Encounter: Payer: Self-pay | Admitting: Internal Medicine

## 2009-11-29 ENCOUNTER — Telehealth: Payer: Self-pay | Admitting: Internal Medicine

## 2009-12-03 ENCOUNTER — Telehealth: Payer: Self-pay | Admitting: Internal Medicine

## 2009-12-07 ENCOUNTER — Ambulatory Visit: Payer: Self-pay | Admitting: Internal Medicine

## 2009-12-07 ENCOUNTER — Encounter: Payer: Self-pay | Admitting: Licensed Clinical Social Worker

## 2009-12-16 ENCOUNTER — Telehealth: Payer: Self-pay | Admitting: Licensed Clinical Social Worker

## 2009-12-30 ENCOUNTER — Encounter: Payer: Self-pay | Admitting: Internal Medicine

## 2010-01-03 ENCOUNTER — Telehealth: Payer: Self-pay | Admitting: Internal Medicine

## 2010-01-07 ENCOUNTER — Ambulatory Visit: Payer: Self-pay | Admitting: Internal Medicine

## 2010-01-07 ENCOUNTER — Telehealth: Payer: Self-pay | Admitting: Internal Medicine

## 2010-01-07 LAB — CONVERTED CEMR LAB
Amphetamine Screen, Ur: NEGATIVE
Barbiturate Quant, Ur: NEGATIVE
Benzodiazepines.: NEGATIVE
Blood Glucose, Fingerstick: 269
Cocaine Metabolites: NEGATIVE

## 2010-01-23 ENCOUNTER — Emergency Department (HOSPITAL_COMMUNITY): Admission: EM | Admit: 2010-01-23 | Discharge: 2010-01-24 | Payer: Self-pay | Admitting: Emergency Medicine

## 2010-01-25 ENCOUNTER — Telehealth: Payer: Self-pay | Admitting: Internal Medicine

## 2010-01-26 ENCOUNTER — Encounter: Payer: Self-pay | Admitting: Internal Medicine

## 2010-01-26 ENCOUNTER — Inpatient Hospital Stay (HOSPITAL_COMMUNITY): Admission: EM | Admit: 2010-01-26 | Discharge: 2010-01-30 | Payer: Self-pay | Admitting: Emergency Medicine

## 2010-01-26 ENCOUNTER — Ambulatory Visit: Payer: Self-pay | Admitting: Internal Medicine

## 2010-01-29 ENCOUNTER — Encounter: Payer: Self-pay | Admitting: Internal Medicine

## 2010-02-01 ENCOUNTER — Emergency Department (HOSPITAL_COMMUNITY): Admission: EM | Admit: 2010-02-01 | Discharge: 2010-02-01 | Payer: Self-pay | Admitting: Emergency Medicine

## 2010-02-03 ENCOUNTER — Telehealth: Payer: Self-pay | Admitting: Internal Medicine

## 2010-02-03 ENCOUNTER — Emergency Department (HOSPITAL_COMMUNITY): Admission: EM | Admit: 2010-02-03 | Discharge: 2010-02-04 | Payer: Self-pay | Admitting: Emergency Medicine

## 2010-02-17 ENCOUNTER — Emergency Department (HOSPITAL_COMMUNITY): Admission: EM | Admit: 2010-02-17 | Discharge: 2010-02-18 | Payer: Self-pay | Admitting: Emergency Medicine

## 2010-02-21 ENCOUNTER — Ambulatory Visit: Payer: Self-pay | Admitting: Internal Medicine

## 2010-02-21 ENCOUNTER — Encounter: Payer: Self-pay | Admitting: Internal Medicine

## 2010-02-21 LAB — CONVERTED CEMR LAB: Blood Glucose, Fingerstick: 162

## 2010-02-23 ENCOUNTER — Encounter: Payer: Self-pay | Admitting: Internal Medicine

## 2010-02-23 DIAGNOSIS — F1910 Other psychoactive substance abuse, uncomplicated: Secondary | ICD-10-CM | POA: Insufficient documentation

## 2010-02-28 ENCOUNTER — Telehealth: Payer: Self-pay | Admitting: Internal Medicine

## 2010-02-28 ENCOUNTER — Inpatient Hospital Stay (HOSPITAL_COMMUNITY): Admission: EM | Admit: 2010-02-28 | Discharge: 2010-03-03 | Payer: Self-pay | Admitting: Internal Medicine

## 2010-03-01 ENCOUNTER — Encounter: Payer: Self-pay | Admitting: Internal Medicine

## 2010-03-01 ENCOUNTER — Encounter: Payer: Self-pay | Admitting: Infectious Diseases

## 2010-03-02 ENCOUNTER — Encounter: Payer: Self-pay | Admitting: Internal Medicine

## 2010-03-02 ENCOUNTER — Encounter: Payer: Self-pay | Admitting: Gastroenterology

## 2010-03-02 ENCOUNTER — Ambulatory Visit: Payer: Self-pay | Admitting: Oncology

## 2010-03-03 ENCOUNTER — Encounter: Payer: Self-pay | Admitting: Internal Medicine

## 2010-03-04 ENCOUNTER — Telehealth: Payer: Self-pay | Admitting: Internal Medicine

## 2010-03-04 ENCOUNTER — Ambulatory Visit: Payer: Self-pay | Admitting: Gastroenterology

## 2010-03-07 ENCOUNTER — Encounter: Payer: Self-pay | Admitting: Gastroenterology

## 2010-03-07 ENCOUNTER — Telehealth: Payer: Self-pay | Admitting: Internal Medicine

## 2010-03-20 ENCOUNTER — Emergency Department (HOSPITAL_COMMUNITY): Admission: EM | Admit: 2010-03-20 | Discharge: 2010-03-20 | Payer: Self-pay | Admitting: Emergency Medicine

## 2010-03-21 ENCOUNTER — Emergency Department (HOSPITAL_COMMUNITY): Admission: EM | Admit: 2010-03-21 | Discharge: 2010-03-22 | Payer: Self-pay | Admitting: Emergency Medicine

## 2010-04-05 ENCOUNTER — Ambulatory Visit: Payer: Self-pay

## 2010-04-06 ENCOUNTER — Inpatient Hospital Stay (HOSPITAL_COMMUNITY)
Admission: EM | Admit: 2010-04-06 | Discharge: 2010-04-12 | Payer: Self-pay | Source: Home / Self Care | Attending: Internal Medicine | Admitting: Internal Medicine

## 2010-04-06 ENCOUNTER — Encounter: Payer: Self-pay | Admitting: Internal Medicine

## 2010-04-06 DIAGNOSIS — R112 Nausea with vomiting, unspecified: Secondary | ICD-10-CM | POA: Insufficient documentation

## 2010-04-06 DIAGNOSIS — Z862 Personal history of diseases of the blood and blood-forming organs and certain disorders involving the immune mechanism: Secondary | ICD-10-CM

## 2010-04-06 DIAGNOSIS — D759 Disease of blood and blood-forming organs, unspecified: Secondary | ICD-10-CM | POA: Insufficient documentation

## 2010-04-11 ENCOUNTER — Telehealth: Payer: Self-pay | Admitting: Internal Medicine

## 2010-04-11 ENCOUNTER — Encounter: Payer: Self-pay | Admitting: Internal Medicine

## 2010-04-21 ENCOUNTER — Emergency Department (HOSPITAL_COMMUNITY)
Admission: EM | Admit: 2010-04-21 | Discharge: 2010-04-21 | Payer: Self-pay | Source: Home / Self Care | Admitting: Emergency Medicine

## 2010-05-02 ENCOUNTER — Ambulatory Visit: Admit: 2010-05-02 | Payer: Self-pay

## 2010-05-14 ENCOUNTER — Encounter: Payer: Self-pay | Admitting: Hospitalist

## 2010-05-16 ENCOUNTER — Encounter: Payer: Self-pay | Admitting: *Deleted

## 2010-05-17 ENCOUNTER — Emergency Department (HOSPITAL_COMMUNITY)
Admission: EM | Admit: 2010-05-17 | Discharge: 2010-05-17 | Payer: Self-pay | Source: Home / Self Care | Admitting: Emergency Medicine

## 2010-05-18 LAB — COMPREHENSIVE METABOLIC PANEL
ALT: <8 U/L (ref 0–35)
AST: 16 U/L (ref 0–37)
CO2: 24 mEq/L (ref 19–32)
Chloride: 105 mEq/L (ref 96–112)
Creatinine, Ser: 0.84 mg/dL (ref 0.4–1.2)
GFR calc Af Amer: >60 mL/min (ref >60)
GFR calc non Af Amer: >60 mL/min (ref >60)
Glucose, Bld: 124 mg/dL — ABNORMAL HIGH (ref 70–99)
Total Bilirubin: 0.9 mg/dL (ref 0.3–1.2)

## 2010-05-18 LAB — RAPID URINE DRUG SCREEN, HOSP PERFORMED
Opiates: NOT DETECTED
Tetrahydrocannabinol: POSITIVE — AB

## 2010-05-18 LAB — DIFFERENTIAL
Basophils Relative: 0 % (ref 0–1)
Eosinophils Absolute: 0.8 10*3/uL — ABNORMAL HIGH (ref 0.0–0.7)
Lymphocytes Relative: 36 % (ref 12–46)
Lymphs Abs: 5.4 10*3/uL — ABNORMAL HIGH (ref 0.7–4.0)
Monocytes Absolute: 0.8 10*3/uL (ref 0.1–1.0)
Neutro Abs: 8.1 10*3/uL — ABNORMAL HIGH (ref 1.7–7.7)

## 2010-05-18 LAB — URINALYSIS, ROUTINE W REFLEX MICROSCOPIC
Nitrite: NEGATIVE
Specific Gravity, Urine: 1.017 (ref 1.005–1.030)
pH: 8 (ref 5.0–8.0)

## 2010-05-18 LAB — CBC
MCH: 23 pg — ABNORMAL LOW (ref 26.0–34.0)
MCHC: 34.1 g/dL (ref 30.0–36.0)
MCV: 67.6 fL — ABNORMAL LOW (ref 78.0–100.0)
Platelets: 531 10*3/uL — ABNORMAL HIGH (ref 150–400)
RBC: 4.78 MIL/uL (ref 3.87–5.11)

## 2010-05-18 LAB — URINE MICROSCOPIC-ADD ON

## 2010-05-18 LAB — LIPASE, BLOOD: Lipase: 23 U/L (ref 11–59)

## 2010-05-26 NOTE — Progress Notes (Signed)
Summary: Medication Denial  Phone Note Call from Patient   Caller: Patient Call For: Laren Everts MD Summary of Call: Call from pt ro check on her refill of Vicodin.  Pt was informed that she has violated her Pain Contarct and would no longer be prescribed her Vicodin through the Cl;inic.  Pt said that she was aware that she had been positive.  Said that she had been reinstated by Dr. Aldine Contes.  Pt wanted to know why she was allowed to get a prescription last month.  Pt was informed that Dr. Cena Benton who is her Primary doctor has made the decision not to refill her Vicodin.  Pt again asked if Dr. Aldine Contes could refill her medication.  Pt was again informed that since Dr. Cena Benton is her Primary he will need to make the decision about further refills.  Pt would like to speak with Dr. Cena Benton. Angelina Ok RN  January 07, 2010 10:13 AM  Initial call taken by: Angelina Ok RN,  January 07, 2010 10:13 AM

## 2010-05-26 NOTE — Progress Notes (Signed)
Summary: refill/ GG  Phone Note Refill Request  on September 30, 2009 4:30 PM  Refills Requested: Medication #1:  VICODIN 5-500 MG  TABS Take 1 tablet by mouth every 12 hours as needed for pain   Last Refilled: 08/25/2009  Method Requested: Telephone to Pharmacy Initial call taken by: Merrie Roof RN,  September 30, 2009 4:31 PM    Prescriptions: VICODIN 5-500 MG  TABS (HYDROCODONE-ACETAMINOPHEN) Take 1 tablet by mouth every 12 hours as needed for pain  #60 x 0   Entered and Authorized by:   Laren Everts MD   Signed by:   Laren Everts MD on 10/01/2009   Method used:   Telephoned to ...       [facility]  [address] [phone] (retail)       [address]       [place], [place]  [id number]       Ph: [phone] or [phone]       Fax: [phone]   RxID:   [id number]   Appended Document: refill/ GG Rx called in

## 2010-05-26 NOTE — Assessment & Plan Note (Signed)
Summary: overbook pt to come at 11:30pm per dr Glennon Hamilton   Vital Signs:  Patient profile:   35 year old female Height:      67 inches (170.18 cm) Weight:      237.6 pounds (108.00 kg) BMI:     37.35 Temp:     98.5 degrees F (36.94 degrees C) oral Pulse rate:   101 / minute BP sitting:   135 / 94  (right arm)  Vitals Entered By: Stanton Kidney Ditzler RN (December 07, 2009 11:51 AM) CC: Hypertension Management Is Patient Diabetic? Yes Did you bring your meter with you today? No Pain Assessment Patient in pain? yes     Location: back, legs, feet and chest Intensity: 7 Type: stabbing Onset of pain  past 1-2 weeks Nutritional Status BMI of > 30 = obese Nutritional Status Detail appetite down CBG Result 484  Have you ever been in a relationship where you felt threatened, hurt or afraid?denies   Does patient need assistance? Functional Status Self care Ambulation Normal Comments Dr Aldine Contes sch appt.   Primary Care Provider:  Laren Everts MD  CC:  Hypertension Management.  History of Present Illness: 35 yo female with PMH outlined below presents to [facility] for regular follow up appointment. She has no concerns at the time. She has been recently hospitalized for chest pain and was told that heart attack was ruled out. No episodes of chest pain, SOB, palpitations, no fever or chills. No specific abdominal or urinary concerns. No recent changes in appetite, weight, sleep patterns, mood. She does report occasional cocaine use and has not been able to refill her meds after her most recent discharged.    Depression History:      The patient denies a depressed mood most of the day and a diminished interest in her usual daily activities.  The patient denies significant weight loss, significant weight gain, insomnia, hypersomnia, psychomotor agitation, psychomotor retardation, fatigue (loss of energy), feelings of worthlessness (guilt), impaired concentration (indecisiveness), and  recurrent thoughts of death or suicide.        The patient denies that she feels like life is not worth living, denies that she wishes that she were dead, and denies that she has thought about ending her life.         Hypertension History:      Positive major cardiovascular risk factors include diabetes, hyperlipidemia, and hypertension.  Negative major cardiovascular risk factors include female age less than 44 years old and non-tobacco-user status.        Positive history for target organ damage include ASHD (either angina/prior MI/prior CABG).     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) 4)  Screening For Lipoid Disorders  (ICD-V77.91) 5)  Boils, Recurrent  (ICD-680.9) 6)  Yeast Infection  (ICD-112.9) 7)  Depression  (ICD-311) 8)  Abdominal Pain Right Lower Quadrant  (ICD-789.03) 9)  Fibromyalgia  (ICD-729.1) 10)  Back Pain  (ICD-724.5) 11)  Obstructive Sleep Apnea  (ICD-327.23) 12)  Diabetes Mellitus, Type II  (ICD-250.00) 13)  Chest Pain  (ICD-786.50) 14)  Hypertension  (ICD-401.9)  15)  Coronary Artery Disease, S/p Ptca  (ICD-414.9) 16)  Microcytic Anemia  (ICD-281.9) 17)  Asthma  (ICD-493.90) 18)  Migraine Headache  (ICD-346.90) 19)  Pedal Edema  (ICD-782.3) 20)  Irregular Menstruation  (ICD-626.4) 21)  Hepatitis B, Hx of  (ICD-V12.09) 22)  Anxiety Depression  (ICD-300.4) 23)  Obesity, Morbid  (ICD-278.01)  Medications Prior to Update: 1)  Cvs Aspirin Ec 325 Mg  Tbec (Aspirin) .... Take 1 Tablet By Mouth Once A Day 2)  Lantus Solostar 100 Unit/ml  Soln (Insulin Glargine) .... Inject 85 Units Subcutaneously Two Times A  Day 3)  Prodigy Twist Top Lancets 28g  Misc (Lancets) .... Use To Test Blood Sugar 4x Daily 4)  Prodigy Autocode Blood Glucose  Strp (Glucose Blood) .... Use To Test Blood Sugars 6x A Day Before and After Meals 5)  Pen Needles 31g X 8 Mm Misc (Insulin Pen Needle) .... Use To Inject Insulin 4 Times A Day 6)  Lyrica 75 Mg  Caps (Pregabalin) .... Take 1 Capsule By Mouth Twice A Day 7)  Ventolin Hfa 108 (90 Base) Mcg/act  Aers (Albuterol Sulfate) .... Inhale 1-2 Puffs Every 4 -6 Hours As Needed 8)  Ferrous Sulfate 325 (65 Fe) Mg  Tbec (Ferrous Sulfate) .... Take 1 Tablet By Mouth Three Times A Day 9)  Neurontin 300 Mg Caps (Gabapentin) .... Take 1 Tablet By Mouth Three Times A Day  Current Medications (verified): 1)  Cvs Aspirin Ec 325 Mg  Tbec (Aspirin) .... Take 1 Tablet By Mouth Once A Day 2)  Lantus Solostar 100 Unit/ml  Soln (Insulin Glargine) .... Inject 85 Units Subcutaneously Two Times A Day 3)  Prodigy Twist Top Lancets 28g  Misc (Lancets) .... Use To Test Blood Sugar 4x Daily 4)  Prodigy Autocode Blood Glucose  Strp (Glucose Blood) .... Use To Test Blood Sugars 6x A Day Before and After Meals 5)  Pen Needles 31g X 8 Mm Misc (Insulin Pen Needle) .... Use To Inject Insulin 4 Times A Day 6)  Lyrica 75 Mg  Caps (Pregabalin) .... Take 1 Capsule By Mouth Twice A Day 7)  Ventolin Hfa 108 (90 Base) Mcg/act  Aers (Albuterol Sulfate) .... Inhale 1-2 Puffs Every 4 -6 Hours As Needed 8)  Ferrous Sulfate 325 (65 Fe) Mg  Tbec (Ferrous Sulfate) .... Take 1 Tablet By Mouth Three Times A Day 9)  Neurontin 300 Mg Caps (Gabapentin) .... Take 1 Tablet By Mouth Three Times A Day 10)  Vicodin 5-500 Mg Tabs (Hydrocodone-Acetaminophen) .... Take 1-2 Tablet Daily As Needed For Pain  Allergies: 1)  ! 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  Risk Factors:  Exercise: yes (12/07/2009)  Risk Factors: Smoking Status: quit (12/07/2009) Passive Smoke Exposure: no (12/07/2009)  Family History: Reviewed history from 09/30/2009 and no changes required. dm:  father  Social History: Reviewed history from 04/01/2007 and no changes required. 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  Review of Systems       per HPI  Physical Exam  General:  NAD, obese Lungs:  normal breath sounds, no crackles, and no wheezes.   Heart:  normal rate, regular rhythm, no murmur, and no gallop.   Abdomen:  obese, soft, non-tender, and normal bowel sounds.   Neurologic:  alert & oriented X3.   Psych:  normally interactive.     Impression & Recommendations:  Problem # 1:  PERIPHERAL NEUROPATHY (ICD-356.9) Cont pain and no relief with neurontin. She has been on vicodin in the past and as far as I could tell she has not violated the contract so I have approved #60 tablets for this month.   Problem # 2:  HYPERLIPIDEMIA (ICD-272.4) She was not able to aford the medicine is not willing ot spend any money on it for now. Will see if Gilford Silvius could assist in any way.  Labs Reviewed: SGOT: 28 (05/11/2008)   SGPT: 10  (05/11/2008)   HDL:35 (05/07/2009)  LDL:107 (05/07/2009)  Chol:194 (05/07/2009)  Trig:262 (05/07/2009)  Problem # 3:  DIABETES MELLITUS, TYPE II (ICD-250.00) Chronic noncompliance and uncontrolled diabetes but slightly improved since her recent discharge. She has not bring in her monitor today and I have advised her to do so. She has also not been able to afford insulin and has not been taking any. I will ask Lupita Leash to see her to check if pt can get any assistance.  Her updated medication list for this problem includes:    Cvs Aspirin Ec 325 Mg Tbec (Aspirin) .Marland Kitchen... Take 1 tablet by mouth once a day    Lantus Solostar 100 Unit/ml Soln (Insulin glargine) ..... Inject 85 units subcutaneously two times a day  Orders: T-Hgb A1C (in-house) ([address]UE) T- Capillary Blood Glucose (45[address])  Labs Reviewed: Creat: 0.86 (10/30/2008)    Reviewed HgBA1c results: 13.8 (12/07/2009)  >14.0 (08/30/2009)  Problem # 4:  HYPERTENSION (ICD-401.9) Somewhat controlled. Pt has been on Metoprolol, Imdur, Cozaar but those meds were stopped upon discharge since pt was not taking any meds to begin with due to financial issues and her BP was in low 100's/50's. Today it is somewhat elevated and I asked her to come in 2 weeks for BP check and we can initiate the therapy if indicated. I will also have her talk to Gilford Silvius to see if pt is eligible for any assistance.  BP today: 135/94 Prior BP: 128/92 (10/01/2009)  Labs Reviewed: K+: 4.5 (10/30/2008) Creat: : 0.86 (10/30/2008)   Chol: 194 (05/07/2009)   HDL: 35 (05/07/2009)   LDL: 107 (05/07/2009)   TG: 262 (05/07/2009)  Problem # 5:  CHEST PAIN (ICD-786.50) No chest pain at the time.   Complete Medication List: 1)  Cvs Aspirin Ec 325 Mg Tbec (Aspirin) .... Take 1 tablet by mouth once a day 2)  Lantus Solostar 100 Unit/ml Soln (Insulin glargine) .... Inject 85 units subcutaneously two times a day 3)  Prodigy Twist Top Lancets 28g Misc (Lancets) .... Use to test  blood sugar 4x daily 4)  Prodigy Autocode Blood Glucose Strp (Glucose blood) .... Use  to test blood sugars 6x a day before and after meals 5)  Pen Needles 31g X 8 Mm Misc (Insulin pen needle) .... Use to inject insulin 4 times a day 6)  Lyrica 75 Mg Caps (Pregabalin) .... Take 1 capsule by mouth twice a day 7)  Ventolin Hfa 108 (90 Base) Mcg/act Aers (Albuterol sulfate) .... Inhale 1-2 puffs every 4 -6 hours as needed 8)  Ferrous Sulfate 325 (65 Fe) Mg Tbec (Ferrous sulfate) .... Take 1 tablet by mouth three times a day 9)  Neurontin 300 Mg Caps (Gabapentin) .... Take 1 tablet by mouth three times a day 10)  Vicodin 5-500 Mg Tabs (Hydrocodone-acetaminophen) .... Take 1-2 tablet daily as needed for pain  Hypertension Assessment/Plan:      [name] hypertensive risk group is category C: Target organ damage and/or diabetes.  Today's blood pressure is 135/94.     Patient Instructions: 1)  Please schedule a follow-up appointment in 2 weeks 2)  Please check your blood pressure regularly, if it is >170 please call clinic at [phone] 3)  Please check your sugar levels regularly and remember to bring the meter with you to the next clinic appointment, if the sugars are > 350 or < 60 please call us at [phone] Prescriptions: VICODIN 5-500 MG TABS (HYDROCODONE-ACETAMINOPHEN) take 1-2 tablet daily as needed for pain  #60 x 0   Entered and Authorized by:   Mliss Sax MD   Signed by:   Mliss Sax MD on 12/07/2009   Method used:   Print then Give to Patient   RxID:   [id number]    Prevention & Chronic Care Immunizations   Influenza vaccine: Not documented   Influenza vaccine deferral: Not indicated  (12/07/2009)    Tetanus booster: Not documented   Td booster deferral: Not indicated  (12/07/2009)    Pneumococcal vaccine: Not documented  Other Screening   Pap smear:  Specimen Adequacy: Satisfactory for evaluation.   Interpretation/Result:Negative for intraepithelial Lesion or  Malignancy.   Interpretation/Result:Fungal organisms c/w Candida present.      (08/09/2007)   Pap smear action/deferral: Deferred-3 yr interval  (12/07/2009)   Pap smear due: 08/2008   Smoking status: quit  (12/07/2009)  Diabetes Mellitus   HgbA1C: 13.8  (12/07/2009)    Eye exam: Not documented   Diabetic eye exam action/deferral: Ophthalmology referral  (08/30/2009)    Foot exam: yes  (08/12/2008)   Foot exam action/deferral: Not indicated   High risk foot: No  (08/12/2008)   Foot care education: Not documented    Urine microalbumin/creatinine ratio: 141.8  (08/30/2009)   Urine microalbumin action/deferral: Ordered    Diabetes flowsheet reviewed?: Yes   Progress toward A1C goal: At goal  Lipids   Total Cholesterol: 194  (05/07/2009)   Lipid panel action/deferral: Lipid Panel ordered   LDL: 107  (05/07/2009)   LDL Direct: Not documented   HDL: 35  (05/07/2009)   Triglycerides: 262  (05/07/2009)    SGOT (AST): 28  (05/11/2008)   BMP action: Not indicated   SGPT (ALT): 10  (05/11/2008)   Alkaline phosphatase: 72  (05/11/2008)   Total bilirubin: 0.5  (05/11/2008)    Lipid flowsheet reviewed?: Yes   Progress toward LDL goal: Deteriorated  Hypertension   Last Blood Pressure: 135 / 94  (12/07/2009)   Serum creatinine: 0.86  (10/30/2008)   Serum potassium 4.5  (10/30/2008)    Hypertension flowsheet reviewed?: Yes   Progress toward BP goal: Deteriorated  Self-Management Support :  Personal Goals (by the next clinic visit) :     Personal A1C goal: 10  (05/07/2009)     Personal blood pressure goal: 130/80  (05/07/2009)     Personal LDL goal: 100  (05/07/2009)    Patient will work on the following items until the next clinic visit to reach self-care goals:     Medications and monitoring: take my medicines every day, check my blood sugar, bring all of my medications to every visit, examine my feet every day  (12/07/2009)     Eating: drink diet soda or water instead of  juice or soda, eat more vegetables, use fresh or frozen vegetables, eat fruit for snacks and desserts, limit or avoid alcohol  (12/07/2009)     Activity: take a 30 minute walk every day  (12/07/2009)    Diabetes self-management support: Written self-care plan, Education handout, Resources for patients handout  (12/07/2009)   Diabetes care plan printed   Diabetes education handout printed   Last diabetes self-management training by diabetes educator: 08/12/2008    Hypertension self-management support: Written self-care plan, Education handout, Resources for patients handout  (12/07/2009)   Hypertension self-care plan printed.   Hypertension education handout printed    Lipid self-management support: Written self-care plan, Education handout, Resources for patients handout  (12/07/2009)   Lipid self-care plan printed.   Lipid education handout printed      Resource handout printed.  Laboratory Results   Blood Tests   Date/Time Received: December 07, 2009 12:05 PM  Date/Time Reported: Burke Keels  December 07, 2009 12:05 PM   HGBA1C: 13.8%   (Normal Range: Non-Diabetic - 3-6%   Control Diabetic - 6-8%) CBG Random:: 484mg /dL  Comments: CBG results given to Shea Evans RN by [facility] at 11:59 Burke Keels  December 07, 2009 12:06 PM

## 2010-05-26 NOTE — Progress Notes (Signed)
Summary: phone/gg  Phone Note Call from Patient   Summary of Call: Call from [facility] at Dr Precious Reel office.  She states patient does not need a follow up appointment unless Dr Loistine Chance needs her to be seen. If you have questions about pt when pt is in clinic just call office to discuss  plan of care @ [phone]. Initial call taken by: Merrie Roof RN,  March 04, 2010 4:04 PM

## 2010-05-26 NOTE — Letter (Signed)
Summary: Results Letter  [facility]  [address] [place], [place] [address]   Phone: [phone]  Fax: [phone]        March 07, 2010 MRN: [id number]    [facility] [address] [place], [place]  [address]    Dear Ms. Kubicki,   Despite repeated attempt, I have been unable to reach you by phone.   You have a bacteria in your stomach that you need treated for.  This bacteria is called H. Pylori, and if left untreated can lead to ulcers and possibly cancer of the stomach. We have provided you samples of a medication called Pylera.  You will need to take 3 pills 4 times a day until gone.  I have also provided you samples of Prilosec you will need to take 1 tablet 2 times a day while taking the Pylera.  During this time you may hold your medications Protonix or the generic of it Pantoprazole, once you have completed all of the Pylera please resume Protonix of Pantoprazole.    Dr Russella Dar would also like you to come by the office and have some lab work.  The orders are placed in our system.  The lab opens daily at 7:30 a.m. and closes at 5:00 p.m.  You do not need to be fasting.  The lab is located in the basement of the [facility].   I have placed the samples of Pylera and Omeprazole at the front desk on the 3rd floor of [facility] [address] [place], [place] [address].  Please come by Mon-Fri between 8 a.m. and 5 p.m. to pick up the samples and have your lab work done.      Sincerely,  Darcey Nora RN, CGRN  This letter has been electronically signed by your physician.  Appended Document: Results Letter Patient did not come pick up the samples of Pylera and Prilosec.  Dr Russella Dar is aware.  Unable to reach the patient we have attempted multiple times.

## 2010-05-26 NOTE — Assessment & Plan Note (Signed)
Summary: routine visit/gg   Vital Signs:  Patient profile:   35 year old female Height:      67 inches (170.18 cm) Weight:      249.03 pounds (113.20 kg) BMI:     39.14 Temp:     98.5 degrees F (36.94 degrees C) oral Pulse rate:   97 / minute BP sitting:   143 / 95  (right arm)  Vitals Entered By: Angelina Ok RN (Aug 30, 2009 3:18 PM) Is Patient Diabetic? Yes Did you bring your meter with you today? No Pain Assessment Patient in pain? yes     Location: FEET, back Intensity: 10/8 Type: aching CBG Result 280  Have you ever been in a relationship where you felt threatened, hurt or afraid?No   Does patient need assistance? Functional Status Self care Ambulation Normal Comments Feet and legs are swelling.  Hands swelling.  Aching all the time x 2 months.  Migraines are worse.   Primary Care Provider:  Laren Everts MD   History of Present Illness: 35 yr old woman with pmhx as described below comes to the clinic for follow up. Patient reports to have leg pain bilaterally, no calf pain. Described as sharp, throbbing, and achy. Patient feels the pain through out the day and progressively getting worse.  Complains of migraines. Has associated photophobia, insomina and sensitivity to sounds. Went to headache clinic 3 times but states that it hasn't helped. Reports that she has tried imitrex without relief of headache. States that she is not sleeping well due to migranes. Reports to only be taking Lantus once a day. Denies chest pain, shortness of breath, diaphoresis.   Depression History:      The patient denies a depressed mood most of the day and a diminished interest in her usual daily activities.         Preventive Screening-Counseling & Management  Alcohol-Tobacco     Smoking Status: quit     Year Quit: quit 17 yrs ago     Passive Smoke Exposure: no  Problems Prior to Update: 1)  Screening For Lipoid Disorders  (ICD-V77.91) 2)  Boils, Recurrent   (ICD-680.9) 3)  Yeast Infection  (ICD-112.9) 4)  Depression  (ICD-311) 5)  Abdominal Pain Right Lower Quadrant  (ICD-789.03) 6)  Fibromyalgia  (ICD-729.1) 7)  Leg Pain, Bilateral  (ICD-729.5) 8)  Back Pain  (ICD-724.5) 9)  Obstructive Sleep Apnea  (ICD-327.23) 10)  Diabetes Mellitus, Type II  (ICD-250.00) 11)  Chest Pain  (ICD-786.50) 12)  Hypertension  (ICD-401.9) 13)  Coronary Artery Disease, S/p Ptca  (ICD-414.9) 14)  Microcytic Anemia  (ICD-281.9) 15)  Asthma  (ICD-493.90) 16)  Migraine Headache  (ICD-346.90) 17)  Pedal Edema  (ICD-782.3) 18)  Irregular Menstruation  (ICD-626.4) 19)  Hepatitis B, Hx of  (ICD-V12.09) 20)  Anxiety Depression  (ICD-300.4) 21)  Obesity, Morbid  (ICD-278.01)  Medications Prior to Update: 1)  Cvs Aspirin Ec 325 Mg  Tbec (Aspirin) .... Take 1 Tablet By Mouth Once A Day 2)  Plavix 75 Mg Tabs (Clopidogrel Bisulfate) .... Take 1 Tablet By Mouth Once A Day 3)  Metoprolol Succinate 50 Mg Xr24h-Tab (Metoprolol Succinate) .... Take 1 Tablet By Mouth Two Times A Day 4)  Imdur 30 Mg Tb24 (Isosorbide Mononitrate) .... Take 1 Tablet By Mouth Once A Day 5)  Cozaar 100 Mg Tabs (Losartan Potassium) .... Take 1 Tablet By Mouth Once A Day 6)  Lantus Solostar 100 Unit/ml  Soln (Insulin Glargine) .... Inject 55  Units Subcutaneously At Bedtime and in Am 7)  Advair Diskus 100-50 Mcg/dose Misc (Fluticasone-Salmeterol) .... Inhale One Puff Two Times A Day 8)  Singulair 10 Mg Tabs (Montelukast Sodium) .... Take 1 Tablet By Mouth Once A Day 9)  Novolog Flexpen 100 Unit/ml  Soln (Insulin Aspart) .... Inject 40 Units Before Meal Three Times A Day- Adds 15 Units To Her 40 When Blood Sugar Is > 200mg /dl 10)  Protonix 40 Mg  Tbec (Pantoprazole Sodium) .... Take 1 Tablet By Mouth Once A Day 11)  Relpax 20 Mg  Tabs (Eletriptan Hydrobromide) .... Take One Pill By Mouth If You Feel A Migraine Attack Coming On. You May Repeat in 2 Hours If Pain Persists. Max 80mg Luvenia Heller 12)  Simvastatin  10 Mg  Tabs (Simvastatin) .... Take 1 Tablet By Mouth Once A Day At Bedtime 13)  Xopenex 0.63 Mg/24ml  Nebu (Levalbuterol Hcl) .... Nebs As Needed Every 3 Hourly. 14)  Atrovent 0.03 %  Soln (Ipratropium Bromide) .... Nebs Every 3 Hourly As Needed. 15)  Lyrica 75 Mg  Caps (Pregabalin) .... Take 1 Capsule By Mouth Twice A Day 16)  Ventolin Hfa 108 (90 Base) Mcg/act  Aers (Albuterol Sulfate) .... Inhale 1-2 Puffs Every 4 -6 Hours As Needed 17)  Ferrous Sulfate 325 (65 Fe) Mg  Tbec (Ferrous Sulfate) .... Take 1 Tablet By Mouth Three Times A Day 18)  Vicodin 5-500 Mg  Tabs (Hydrocodone-Acetaminophen) .... Take 1 Tablet By Mouth Every 12 Hours As Needed For Pain 19)  Prodigy Autocode Blood Glucose  Strp (Glucose Blood) .... Use To Test Blood Sugars 6x A Day Before and After Meals 20)  Prodigy Twist Top Lancets 28g  Misc (Lancets) .... Use To Test Blood Sugar 4x Daily 21)  Pen Needles 31g X 8 Mm Misc (Insulin Pen Needle) .... Use To Inject Insulin 4 Times A Day  Current Medications (verified): 1)  Cvs Aspirin Ec 325 Mg  Tbec (Aspirin) .... Take 1 Tablet By Mouth Once A Day 2)  Plavix 75 Mg Tabs (Clopidogrel Bisulfate) .... Take 1 Tablet By Mouth Once A Day 3)  Metoprolol Succinate 50 Mg Xr24h-Tab (Metoprolol Succinate) .... Take 1 Tablet By Mouth Two Times A Day 4)  Imdur 30 Mg Tb24 (Isosorbide Mononitrate) .... Take 1 Tablet By Mouth Once A Day 5)  Cozaar 100 Mg Tabs (Losartan Potassium) .... Take 1 Tablet By Mouth Once A Day 6)  Lantus Solostar 100 Unit/ml  Soln (Insulin Glargine) .... Inject 55 Units Subcutaneously At Bedtime and in Am 7)  Advair Diskus 100-50 Mcg/dose Misc (Fluticasone-Salmeterol) .... Inhale One Puff Two Times A Day 8)  Singulair 10 Mg Tabs (Montelukast Sodium) .... Take 1 Tablet By Mouth Once A Day 9)  Novolog Flexpen 100 Unit/ml  Soln (Insulin Aspart) .... Inject 40 Units Before Meal Three Times A Day- Adds 15 Units To Her 40 When Blood Sugar Is > 200mg /dl 10)  Protonix 40 Mg   Tbec (Pantoprazole Sodium) .... Take 1 Tablet By Mouth Once A Day 11)  Relpax 20 Mg  Tabs (Eletriptan Hydrobromide) .... Take One Pill By Mouth If You Feel A Migraine Attack Coming On. You May Repeat in 2 Hours If Pain Persists. Max 80mg Luvenia Heller 12)  Simvastatin 10 Mg  Tabs (Simvastatin) .... Take 1 Tablet By Mouth Once A Day At Bedtime 13)  Xopenex 0.63 Mg/58ml  Nebu (Levalbuterol Hcl) .... Nebs As Needed Every 3 Hourly. 14)  Atrovent 0.03 %  Soln (Ipratropium Bromide) .... Nebs Every 3 Hourly  As Needed. 15)  Lyrica 75 Mg  Caps (Pregabalin) .... Take 1 Capsule By Mouth Twice A Day 16)  Ventolin Hfa 108 (90 Base) Mcg/act  Aers (Albuterol Sulfate) .... Inhale 1-2 Puffs Every 4 -6 Hours As Needed 17)  Ferrous Sulfate 325 (65 Fe) Mg  Tbec (Ferrous Sulfate) .... Take 1 Tablet By Mouth Three Times A Day 18)  Vicodin 5-500 Mg  Tabs (Hydrocodone-Acetaminophen) .... Take 1 Tablet By Mouth Every 12 Hours As Needed For Pain 19)  Prodigy Autocode Blood Glucose  Strp (Glucose Blood) .... Use To Test Blood Sugars 6x A Day Before and After Meals 20)  Prodigy Twist Top Lancets 28g  Misc (Lancets) .... Use To Test Blood Sugar 4x Daily 21)  Pen Needles 31g X 8 Mm Misc (Insulin Pen Needle) .... Use To Inject Insulin 4 Times A Day  Allergies: 1)  ! 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 Now, unemployed.  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  Risk Factors: Exercise: yes (05/07/2009)  Risk Factors: Smoking Status: quit (08/30/2009) Passive Smoke Exposure: no (08/30/2009)  Social History: Reviewed history from 04/01/2007 and no changes required. 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  Review of Systems       The patient complains of headaches.  The patient denies fever, chest pain, dyspnea on exertion, peripheral edema, prolonged cough, hemoptysis, abdominal pain, melena, hematochezia, hematuria, and muscle weakness.    Physical Exam  General:  NAD, obese Mouth:  pharynx pink and moist.   Neck:  supple.   Lungs:  Normal respiratory effort, chest expands symmetrically. Lungs are clear to auscultation, no crackles or wheezes. Heart:  Normal rate and regular rhythm. S1 and S2 normal without gallop, murmur, click, rub or other extra sounds. Abdomen:  obese, soft, non-tender, and normal bowel sounds.   Msk:  normal ROM.   Pulses:  2+ pulses bilaterally throughout Extremities:  no edema Neurologic:  alert & oriented X3, cranial nerves II-XII intact, strength normal in all extremities, sensation intact to light touch, and gait normal.     Impression & Recommendations:  Problem # 1:  DIABETES MELLITUS, TYPE II (ICD-250.00) Uncontrolled. Patient never brings meter for review. Patient not taking medication as directed. Endocrinologist consult pending for insulin pump. Patient instructed to take medication as directed, check blood sugars at least twice a day, and bring meter with her during the next appointment.   Her updated medication list for this problem includes:    Cvs Aspirin Ec 325 Mg Tbec (Aspirin) .Marland Kitchen... Take 1 tablet by mouth once a day    Cozaar 100 Mg Tabs (Losartan potassium) .Marland Kitchen... Take 1 tablet by  mouth once a day    Lantus Solostar 100 Unit/ml Soln (Insulin glargine) ..... Inject 55 units subcutaneously at bedtime and in am    Novolog Flexpen 100 Unit/ml Soln (Insulin aspart) ..... Inject 40 units before meal three times a day- adds 15 units to her 40 when blood sugar is > 200mg /dl  Orders: T- Capillary Blood Glucose (82948) T-Hgb A1C (in-house) (02725DG) T-Urine Microalbumin w/creat. ratio [phone]) Ophthalmology Referral (Ophthalmology)  Labs Reviewed: Creat: 0.86 (10/30/2008)    Reviewed HgBA1c results: >14.0 (08/30/2009)  >14.0 (05/07/2009)  Problem # 2:  HYPERTENSION (ICD-401.9) Prior visit BP controlled. Will continue current regimen and reasses next visit need to increase medication.  Her updated medication list for this problem includes:    Metoprolol Succinate 50 Mg Xr24h-tab (Metoprolol succinate) .Marland Kitchen... Take 1 tablet by mouth two times a day    Cozaar 100 Mg Tabs (Losartan potassium) .Marland Kitchen... Take 1 tablet by mouth once a day  BP today: 143/95 Prior BP: 125/87 (05/07/2009)  Labs Reviewed: K+: 4.5 (10/30/2008) Creat: : 0.86 (10/30/2008)   Chol: 194 (05/07/2009)   HDL: 35 (05/07/2009)   LDL: 107 (05/07/2009)   TG: 262 (05/07/2009)  Problem # 3:  MIGRAINE HEADACHE (ICD-346.90) Patient was instructed to return to headache clinic for further management. Will get records.   Her updated medication list for this problem includes:    Cvs Aspirin Ec 325 Mg Tbec (Aspirin) .Marland Kitchen... Take 1 tablet by mouth once a day    Metoprolol Succinate 50 Mg Xr24h-tab (Metoprolol succinate) .Marland Kitchen... Take 1 tablet by mouth two times a day    Relpax 20 Mg Tabs (Eletriptan hydrobromide) .Marland Kitchen... Take one pill by mouth if you feel a migraine attack coming on. you may repeat in 2 hours if pain persists. max 80mg /24hrs    Vicodin 5-500 Mg Tabs (Hydrocodone-acetaminophen) .Marland Kitchen... Take 1 tablet by mouth every 12 hours as needed for pain  Problem # 4:  POLYNEUROPATHY, DIABETIC (ICD-357.2) Patient  was started on neurontin. Will reasses next visit need to increase medication.   Problem # 5:  BACK PAIN (ICD-724.5) UDS negative. Will recheck UDS before refilling medication next month. If there is another UDS that is negative will assume patient is not taking medication or not taking as directed and will stop refilling.   Her updated medication list for this problem includes:    Cvs Aspirin Ec 325 Mg Tbec (Aspirin) .Marland Kitchen... Take 1 tablet by mouth once a day    Vicodin 5-500 Mg Tabs (Hydrocodone-acetaminophen) .Marland Kitchen... Take 1 tablet by mouth every 12 hours as needed for pain  Orders: T-Drug Screen-Urine, (single) [phone])  Problem # 6:  FIBROMYALGIA (ICD-729.1) On lyrica. Continue.   Her updated medication list for this problem includes:    Cvs Aspirin Ec 325 Mg Tbec (Aspirin) .Marland Kitchen... Take 1 tablet by mouth once a day    Vicodin 5-500 Mg Tabs (Hydrocodone-acetaminophen) .Marland Kitchen... Take 1 tablet by mouth every 12 hours as needed for pain  Problem # 7:  INSOMNIA (ICD-780.52) Will give ambien to get patient back on circadian cycle. Only prescribed 14 pills. Will not refill.  Her updated medication list for this problem includes:    Ambien 5 Mg Tabs (Zolpidem tartrate) .Marland Kitchen... Take 1 tablet by mouth at bedtime  Problem # 8:  CORONARY ARTERY DISEASE, S/P PTCA (ICD-414.9) Stable. Continue current regimen.  Her updated medication list for this problem includes:    Cvs Aspirin Ec 325 Mg Tbec (Aspirin) .Marland Kitchen... Take 1 tablet by mouth once a day  Plavix 75 Mg Tabs (Clopidogrel bisulfate) .Marland Kitchen... Take 1 tablet by mouth once a day    Metoprolol Succinate 50 Mg Xr24h-tab (Metoprolol succinate) .Marland Kitchen... Take 1 tablet by mouth two times a day    Imdur 30 Mg Tb24 (Isosorbide mononitrate) .Marland Kitchen... Take 1 tablet by mouth once a day    Cozaar 100 Mg Tabs (Losartan potassium) .Marland Kitchen... Take 1 tablet by mouth once a day  Problem # 9:  HYPERLIPIDEMIA (ICD-272.4) Recheck FLP and cmet in 3 months.  Her updated medication  list for this problem includes:    Simvastatin 10 Mg Tabs (Simvastatin) .Marland Kitchen... Take 1 tablet by mouth once a day at bedtime  Labs Reviewed: SGOT: 28 (05/11/2008)   SGPT: 10 (05/11/2008)   HDL:35 (05/07/2009)  LDL:107 (05/07/2009)  Chol:194 (05/07/2009)  Trig:262 (05/07/2009)  Complete Medication List: 1)  Cvs Aspirin Ec 325 Mg Tbec (Aspirin) .... Take 1 tablet by mouth once a day 2)  Plavix 75 Mg Tabs (Clopidogrel bisulfate) .... Take 1 tablet by mouth once a day 3)  Metoprolol Succinate 50 Mg Xr24h-tab (Metoprolol succinate) .... Take 1 tablet by mouth two times a day 4)  Imdur 30 Mg Tb24 (Isosorbide mononitrate) .... Take 1 tablet by mouth once a day 5)  Cozaar 100 Mg Tabs (Losartan potassium) .... Take 1 tablet by mouth once a day 6)  Lantus Solostar 100 Unit/ml Soln (Insulin glargine) .... Inject 55 units subcutaneously at bedtime and in am 7)  Advair Diskus 100-50 Mcg/dose Misc (Fluticasone-salmeterol) .... Inhale one puff two times a day 8)  Singulair 10 Mg Tabs (Montelukast sodium) .... Take 1 tablet by mouth once a day 9)  Novolog Flexpen 100 Unit/ml Soln (Insulin aspart) .... Inject 40 units before meal three times a day- adds 15 units to her 40 when blood sugar is > 200mg /dl 10)  Protonix 40 Mg Tbec (Pantoprazole sodium) .... Take 1 tablet by mouth once a day 11)  Relpax 20 Mg Tabs (Eletriptan hydrobromide) .... Take one pill by mouth if you feel a migraine attack coming on. you may repeat in 2 hours if pain persists. max 80mg /24hrs 12)  Simvastatin 10 Mg Tabs (Simvastatin) .... Take 1 tablet by mouth once a day at bedtime 13)  Xopenex 0.63 Mg/91ml Nebu (Levalbuterol hcl) .... Nebs as needed every 3 hourly. 14)  Atrovent 0.03 % Soln (Ipratropium bromide) .... Nebs every 3 hourly as needed. 15)  Lyrica 75 Mg Caps (Pregabalin) .... Take 1 capsule by mouth twice a day 16)  Ventolin Hfa 108 (90 Base) Mcg/act Aers (Albuterol sulfate) .... Inhale 1-2 puffs every 4 -6 hours as needed 17)   Ferrous Sulfate 325 (65 Fe) Mg Tbec (Ferrous sulfate) .... Take 1 tablet by mouth three times a day 18)  Vicodin 5-500 Mg Tabs (Hydrocodone-acetaminophen) .... Take 1 tablet by mouth every 12 hours as needed for pain 19)  Prodigy Autocode Blood Glucose Strp (Glucose blood) .... Use to test blood sugars 6x a day before and after meals 20)  Prodigy Twist Top Lancets 28g Misc (Lancets) .... Use to test blood sugar 4x daily 21)  Pen Needles 31g X 8 Mm Misc (Insulin pen needle) .... Use to inject insulin 4 times a day 22)  Neurontin 300 Mg Caps (Gabapentin) .... Take 1 tablet by mouth once a day 23)  Ambien 5 Mg Tabs (Zolpidem tartrate) .... Take 1 tablet by mouth at bedtime  Other Orders: Gynecologic Referral (Gyn)  Patient Instructions: 1)  Please schedule a follow-up appointment in 3  months. 2)  Make appointment with Headache clinic and go to the appointment with Endocrinologist.  3)  Take all medication as directed. 4)  Check your blood sugars regularly. If your readings are usually above :400 or below 70 you should contact our office. 5)  See your eye doctor yearly to check for diabetic eye damage. Prescriptions: NEURONTIN 300 MG CAPS (GABAPENTIN) Take 1 tablet by mouth once a day  #30 x 2   Entered and Authorized by:   Laren Everts MD   Signed by:   Laren Everts MD on 08/30/2009   Method used:   Print then Give to Patient   RxID:   [id number] NEURONTIN 300 MG CAPS (GABAPENTIN) Take 1 tablet by mouth once a day  #30 x 2   Entered and Authorized by:   Laren Everts MD   Signed by:   Laren Everts MD on 08/30/2009   Method used:   Print then Give to Patient   RxID:   [id number] AMBIEN 5 MG TABS (ZOLPIDEM TARTRATE) Take 1 tablet by mouth at bedtime  #14 x 0   Entered and Authorized by:   Laren Everts MD   Signed by:   Laren Everts MD on 08/30/2009   Method used:   Print then Give to Patient   RxID:    [id number] NEURONTIN 300 MG CAPS (GABAPENTIN) Take 1 tablet by mouth once a day  #30 x 2   Entered and Authorized by:   Laren Everts MD   Signed by:   Laren Everts MD on 08/30/2009   Method used:   Electronically to        [facility]  [address] [phone]* (retail)       [address]       [place], [place]  [id number]       Ph: [phone] or [phone]       Fax: [phone]   RxID:   [id number]    Vital Signs:  Patient profile:   36 year old female Height:      67 inches (170.18 cm) Weight:      249.03 pounds (113.20 kg) BMI:     39.14 Temp:     98.5 degrees F (36.94 degrees C) oral Pulse rate:   97 / minute BP sitting:   143 / 95  (right arm)  Vitals Entered By: Angelina Ok RN (Aug 30, 2009 3:18 PM)   Prevention & Chronic Care Immunizations   Influenza vaccine: Not documented    Tetanus booster: Not documented    Pneumococcal vaccine: Not documented  Other Screening   Pap smear:  Specimen Adequacy: Satisfactory for evaluation.   Interpretation/Result:Negative for intraepithelial Lesion or Malignancy.   Interpretation/Result:Fungal organisms c/w Candida present.      (08/09/2007)   Pap smear action/deferral: GYN Referral  (08/30/2009)   Pap smear due: 08/2008   Smoking status: quit  (08/30/2009)  Diabetes Mellitus   HgbA1C: >14.0  (08/30/2009)    Eye exam: Not documented   Diabetic eye exam action/deferral: Ophthalmology referral  (08/30/2009)    Foot exam: yes  (08/12/2008)   High risk foot: No  (08/12/2008)   Foot care education: Not documented    Urine microalbumin/creatinine ratio: 30.9  (08/12/2008)   Urine microalbumin action/deferral: Ordered    Diabetes flowsheet reviewed?: Yes   Progress toward A1C goal: Unchanged  Lipids   Total Cholesterol: 194  (05/07/2009)   Lipid panel action/deferral: Lipid Panel  ordered   LDL: 107  (05/07/2009)   LDL Direct: Not documented   HDL: 35   (05/07/2009)   Triglycerides: 262  (05/07/2009)    SGOT (AST): 28  (05/11/2008)   SGPT (ALT): 10  (05/11/2008)   Alkaline phosphatase: 72  (05/11/2008)   Total bilirubin: 0.5  (05/11/2008)  Hypertension   Last Blood Pressure: 143 / 95  (08/30/2009)   Serum creatinine: 0.86  (10/30/2008)   Serum potassium 4.5  (10/30/2008)    Hypertension flowsheet reviewed?: Yes   Progress toward BP goal: Deteriorated  Self-Management Support :   Personal Goals (by the next clinic visit) :     Personal A1C goal: 10  (05/07/2009)     Personal blood pressure goal: 130/80  (05/07/2009)     Personal LDL goal: 100  (05/07/2009)    Patient will work on the following items until the next clinic visit to reach self-care goals:     Medications and monitoring: take my medicines every day, check my blood sugar, bring all of my medications to every visit, examine my feet every day  (08/30/2009)     Eating: drink diet soda or water instead of juice or soda, eat more vegetables, use fresh or frozen vegetables, eat foods that are low in salt, eat baked foods instead of fried foods, eat fruit for snacks and desserts, limit or avoid alcohol  (08/30/2009)     Activity: take the stairs instead of the elevator  (08/30/2009)    Diabetes self-management support: Written self-care plan, Education handout, Pre-printed educational material, Resources for patients handout  (08/30/2009)   Diabetes care plan printed   Diabetes education handout printed   Last diabetes self-management training by diabetes educator: 08/12/2008    Hypertension self-management support: Written self-care plan, Education handout, Pre-printed educational material, Resources for patients handout  (08/30/2009)   Hypertension self-care plan printed.   Hypertension education handout printed    Lipid self-management support: Written self-care plan, Resources for patients handout  (08/30/2009)   Lipid self-care plan printed.      Resource handout  printed.   Nursing Instructions: Gyn referral for screening Pap (see order) Refer for screening diabetic eye exam (see order)    Laboratory Results   Blood Tests   Date/Time Received: Aug 30, 2009 3:48 PM  Date/Time Reported: Burke Keels  Aug 30, 2009 3:48 PM   HGBA1C: >14.0%   (Normal Range: Non-Diabetic - 3-6%   Control Diabetic - 6-8%) CBG Random:: 280mg /dL      Process Orders Check Orders Results:     [facility]: ABN not required for this insurance Tests Sent for requisitioning (Sep 03, 2009 11:30 AM):     08/30/2009: [facility] -- T-Drug Screen-Urine, (single) [[id number]] (signed)     08/30/2009: [facility] -- T-Urine Microalbumin w/creat. ratio [[id number]] (signed)

## 2010-05-26 NOTE — Miscellaneous (Signed)
Summary: Admission Note  INTERNAL MEDICINE ADMISSION HISTORY AND PHYSICAL  Admission Date: 02/21/2010  Attending: Dr Phillips Odor, Waynetta Sandy R1: Dr Almyra Deforest [phone] R2: Dr Deatra Robinson [phone]  Weekends, Holidays, or after 5 pm Weekdays  First Contact: [phone] Second Contact: [phone]   PCP:Dr Ramses Jule Economy   CC: Nausea and vomiting- since 71month  HPI: 35 y/o female patient came into the [facility] c/o nausea and vomiting since 71month. she throws up greenish fluid multiple times/day, unrelated to food intake. Since this morning she has vomited 7 times- greenish clear fluid with speck of blood. Also c/o pain abdomen in the Left lower quadrant, constant, 10/10 intensity when she throws up and it radiates to the epigastric region at that time. C/o multiple episodes of loose stools since yesterday morning, not foul smelling, no blood or mucus in stool. She also noticed Yellowish discharge per vagina since yesterday. She is in a monogomous heterosexual relationship for one year. She denies dyspaeunia. Small quantities, yellowish, foul smelling, not associated with itching. No c/o fever/dizziness.She is unable to tolerate any oral intake. She was admitted to the hospital for the same complaints on 01/31/2010. She was discharged with a diagnosis of UTI and on treatment with bactrim for the same.   ALLERGIES:  LISINOPRIL  PAST MEDICAL HISTORY: Past Medical History: CAD- s/p Subendocardial MI with PDA angioplasty(no stent) on 06/15/06 and relook cath 06/19/06 showed patency of site. 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 aureus- sensitive to all abx  MEDICATIONS: Current Meds:  [employer] ASPIRIN EC 325 MG  TBEC (ASPIRIN) Take 1 tablet by mouth once a day LANTUS SOLOSTAR 100 UNIT/ML  SOLN (INSULIN GLARGINE) Inject 85 units subcutaneously once a day until seen by your primary care doctor.     PRODIGY TWIST TOP LANCETS 28G  MISC (LANCETS) use to test blood sugar 4x daily     PRODIGY AUTOCODE BLOOD GLUCOSE  STRP (GLUCOSE BLOOD) use to test blood sugars 6x a day before and after meals     PEN NEEDLES 31G X 8 MM MISC (INSULIN PEN NEEDLE) use to inject insulin 4 times a day LYRICA 75 MG  CAPS (PREGABALIN) Take 1 capsule by mouth twice a day VENTOLIN HFA 108 (90 BASE) MCG/ACT  AERS (ALBUTEROL SULFATE) Inhale 1-2 puffs every 4 -6 hours as needed FERROUS SULFATE 325 (65 FE) MG  TBEC (FERROUS SULFATE) Take 1 tablet by mouth three times a day NEURONTIN 300 MG CAPS (GABAPENTIN) Take 1 tablet by mouth three times a day NOVOLOG PENFILL 100 UNIT/ML SOLN (INSULIN ASPART) 50 units three times daily FLEXERIL 10 MG TABS (CYCLOBENZAPRINE HCL) Take 1 tab by mouth at bedtime DICLOFENAC POTASSIUM 50 MG TABS (DICLOFENAC POTASSIUM) Take 1 tab twice daily ULTRAM ER 100 MG XR24H-TAB (TRAMADOL HCL) Take 1 tablet by mouth once a day METOPROLOL TARTRATE 25 MG TABS (METOPROLOL TARTRATE) Take 1 tablet by mouth two times a day ONDANSETRON HCL 4  MG TABS (ONDANSETRON HCL) Take 1 tab every 4 hours as needed for nausea. 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 Smoking- prev H/O smoking-   FAMILY HISTORY Family History: dm:  father  ROS: Negative  Except as above. VITALS: T: 99.1 P: 105/min BP:128/89   R:  O2SAT:  ON: PHYSICAL EXAM: General:  alert and cooperative to examination. Patient is in distress due to pain abdomen.  Head:  normocephalic and atraumatic.   Eyes:  vision grossly intact, pupils equal, pupils round, pupils reactive to light, no injection and anicteric.   Mouth:  pharynx pink and moist, no erythema, and no exudates.   Neck:  supple, full ROM, no thyromegaly, no JVD, and no carotid bruits.   Lungs:  normal respiratory effort, no accessory muscle use, normal breath sounds, no crackles, and no wheezes.  CV: RRR, no M, S3, S4, no lifts or rubs. Abdomen: Soft, Tenderness + in the LLQ, No rebound, BS + but reduced, no organomegaly. No free fluid. MSK: FROM of all extremities proximately and distally bialterally; no joint erythema, effusion or increased warmth to touch bilaterally.  Neurologic:  alert & oriented X3, cranial nerves II-XII intact, strength normal in all extremities, sensation intact to light touch, and gait normal.   Skin:  turgor normal and no rashes.   Psych:  Oriented X3, memory intact for recent and remote, normally interactive, good eye contact, not anxious appearing, and not depressed appearing. GU: Labia Majora- No leisions, vaginal canal with multiple adipose folds due to patients obesity. Vaginal canal with malordors thich cottage cheese like discharge. Unable to palpate uterus and cervix. Ovaries non palpable.   LABS:  UA ON 02/17/10 Color, Urine                             AMBER                       YELLOW     Appearance                         CLOUDY                          CLEAR  Specific Gravity                   1.022                              1.005-1.030  pH                                     6.0                                    5.0-8.0  Urine Glucose                    500  NEG              mg/dL  Bilirubin                             SMALL                                NEG  Ketones                             40                                      NEG              mg/dL  Blood                                NEGATIVE                          NEG  Protein                                  30                                   NEG              mg/dL  Urobilinogen                          1.0                                   0.0-1.0          mg/dL  Nitrite                               NEGATIVE                            NEG  Leukocytes                       NEGATIVE                            NEG   URINE MICROSCOPY ON 02/17/10  Squamous Epithelial / LPF          MANY                             RARE  WBC / HPF                                0-2                                 <  3 WBC/hpf  Bacteria / HPF                           MANY                             RARE  Urine-Other                                SEE NOTE.    MUCOUS PRESENT   URINE CULTURE 02/17/2010   SPECIMEN DESCRIPTION:         URINE, CLEAN CATCH  SPECIAL REQUESTS:                 NONE  COLONY COUNT:                        >=100,000 COLONIES/ML  CULTURE:                                Multiple bacterial morphotypes present, none                                                  predominant. Suggest appropriate recollection if                                                  clinically indicated.   LIPASE ON 02/17/10   Lipase                                   25                11-59            U/L    CMP on 02/21/2010    Sodium (NA)                                            133                                    135-145          mEq/L  Potassium (K)                                          3.4                                     3.5-5.1          mEq/L  Chloride  102                                     96-112           mEq/L  CO2                                                        24                                       19-32            mEq/L  Glucose                                                  140                                     70-99            mg/dL  BUN                                                         8                                       6-23             mg/dL  Creatinine                                                 0.72                                   0.4-1.2          mg/dL  GFR, Est Non African American                 >60                                    >60              mL/min  GFR, Est African American                           >60                                 >  60              mL/min    Oversized comment, see footnote  1  Bilirubin, Total                                          0.7                                      0.3-1.2          mg/dL  Alkaline Phosphatase                                59                                      39-117           U/L  SGOT (AST)                                             17                                      0-37             U/L  SGPT (ALT)                                               8                                       0-35             U/L  Total  Protein                                            8.1                                     6.0-8.3          g/dL  Albumin-Blood                                          3.3                                      3.5-5.2          g/dL  Calcium  9.2                                      8.4-10.5         mg/dL   LIPASE ON 30/86/5784  Lipase                                   24                11-59            U/L   CBC ON 02/21/2010  WBC                                        14.9                        4.0-10.5         K/uL  RBC                                         5.21                        3.87-5.11        MIL/uL  Hemoglobin (HGB)                    11.9                          12.0-15.0        g/dL  Hematocrit (HCT)                       35.3                        36.0-46.0        %  MCV                                        67.8                        78.0-100.0       fL  MCH -                                      22.8                        26.0-34.0        pg  MCHC                                     33.7                         30.0-36.0  g/dL  RDW                                       18.4                        11.5-15.5        %  Platelet Count (PLT)                  451                         150-400          K/uL  Neutrophils, %                          67                            43-77            %  Lymphocytes, %                       25                            12-46            %  Monocytes, %                             7                           3-12             %  Eosinophils, %                           1                             0-5              %  Basophils, %                             0                             0-1              %  Neutrophils, Absolute                10.1                        1.7-7.7          K/uL  Lymphocytes, Absolute            3.7                           0.7-4.0          K/uL  Monocytes, Absolute  1.0                            0.1-1.0          K/uL  Eosinophils, Absolute               0.1                           0.0-0.7          K/uL  Basophils, Absolute                 0.0                           0.0-0.1          K/uL   UA ON 02/21/2010  Findings   Color, Urine                             AMBER                       YELLOW   Appearance                           CLOUDY                         CLEAR  Specific Gravity                       1.026                         1.005-1.030  pH                                       5.5                                   5.0-8.0  Urine Glucose                       NEGATIVE                      NEG              mg/dL  Bilirubin                                SMALL                             NEG  Ketones                                  15                                 NEG  mg/dL  Blood                                    NEGATIVE                      NEG  Protein                                  30                                   NEG              mg/dL  Urobilinogen                             1.0                               0.0-1.0          mg/dL  Nitrite                                  NEGATIVE                        NEG  Leukocytes                               TRACE      a                  NEG    URINE MICROSCOPY ON 02/21/2010  Findings  Result Name                              Result     Abnl   Normal Range     Units      Perf. Loc.  Squamous Epithelial / LPF                FEW        a      RARE  WBC / HPF                                0-2               < 3               WBC/hpf  RBC / HPF                                0-2                < 3               RBC/hpf  Bacteria / HPF                           RARE  RARE  Urine-Other                              SEE NOTE.    MUCOUS PRESENT   HBA1C ON 02/21/2010   Result Name                              Result     Abnl   Normal Range     Units      Perf. Loc.  Hemoglobin A1C                           9.3        h      4.6-6.1          %    Oversized comment, see footnote  1  Mean Plasma Glucose                      220        h                       mg/dL    Reference range: <657    CXR 02/21/2010 Findings:   Normal heart size, mediastinal contours, and pulmonary vascularity.   Lungs clear.   No pleural effusion or pneumothorax.   Bones unremarkable.    IMPRESSION:   No acute abnormalities.  ABX 02/21/2010  Findings:   Nonobstructive bowel gas pattern.   No bowel dilatation or free intraperitoneal air.   Osseous structures unremarkable.   Small pelvic phleboliths stable.   No definite urinary tract calcification.    IMPRESSION:   Nonspecific bowel gas  pattern.     ASSESSMENT AND PLAN: (1) NAUSEA AND VOMITING: Possibly due to infectious cause. As patient is unable to tolerate oral intake, keep her NPO except for medications for 24hrs.  Continue to monitor Start IVF and monitor BP To rule out persistent UTI - UA and Urine culture Abdomen Series to check for any obstruction Chest Xray to rule out perforation Lipase to rule out pancreatitis CBC, CMET   Started treatment with Zofran If loose stools persist consider stool for O & P, stool cytology  Await Cervical swab results to rule out PID  (2) DM: HbA1 C 13.8 on 12/07/09. Check A1c, will cover with SSI. Started on Insulin Lantus 15units (3) HTN:     Continue home medications (4) Anemia- Microcytic anemia with Heterozygous Hemoglobin C- Continue to monitor.  (5) DEPRESSION/ANXIETY:    Continue home medications (6) VTE PROPH: lovenox         ATTENDING PHYSICIAN: I discussed the case with the resident(s) as noted ad reviewed the resident's notes. I agree with the finding and plan - please refer to the attending physician note for more details.  Signature__________________________________________  Printed Name_______________________________________

## 2010-05-26 NOTE — Miscellaneous (Signed)
Summary: Hospital Admission...  INTERNAL MEDICINE ADMISSION HISTORY AND PHYSICAL  Attending: Dr. Lina Sayre First contact: Dr. Tonny Branch [phone] Second contact: Dr. Gilford Rile [phone]  PCP: Dr. Cena Benton  XB:[id number] pain, intractable N/V  HPI: 35 y/o female with pmh significant for CAD (with PDA angioplasty in 05/2006), HTN, DM (uncontrolled 9.3 in Sep/2011) and multiple recent admissions secondary to abdominal pain and intractable nausea and vomiting. Who came to the Ed complaining of worsening of her symptoms for the last 4 days and inability to keep anything down. Patient denies any hematemesis, blood in her stool, diarrhea or constipation. Patient also deneis SOB, fever.  ALLERGIES: ! LISINOPRIL (nausea)  PAST MEDICAL HISTORY: CAD- s/p Subendocardial MI with PDA angioplasty(no stent) on 06/15/06 and relook cath 06/19/06 showed patency of site. Cath 12/10- no restenosis or significant CAD progression Microcytic Anemia with heterozygous Hemoglobin C-Baseline Hgb 8 Asthma:since age 59years Migraines on prophylaxis Diabetes mellitus, type II Uncontrolled:Dx 9/06- HbA1c- 9.3 in 9/11 Hypertension Hepatitis B, hx of: Hep BeAb+, Hep B cAb+ & Hep BsAg+ (9/06) Morbid obesity H/o Irregular peroids:[facility]                                  Small ovarian follicles seen on CT(9/06) H/o GrpB Pyelonephritis (9/06) and UTI- 07/11- E.Coli, 12/10- GBS Depression Vaginal Abscess- 10/09- Abundant S. aureus- sensitive to all abx   MEDICATIONS: [facility] ASPIRIN EC 325 MG  TBEC (ASPIRIN) Take 1 tablet by mouth once a day LANTUS SOLOSTAR 100 UNIT/ML SOLN (INSULIN GLARGINE) Inject 10 units subcutaneously once a day until your next appointment.     PRODIGY TWIST TOP LANCETS 28G  MISC (LANCETS) use to test blood sugar 4x daily     PRODIGY AUTOCODE BLOOD GLUCOSE  STRP (GLUCOSE BLOOD) use to test blood sugars 6x a day before and after meals     PEN NEEDLES 31G X 8 MM MISC (INSULIN PEN NEEDLE) use to  inject insulin 4 times a day FERROUS SULFATE 325 (65 FE) MG  TBEC (FERROUS SULFATE) Take 1 tablet by mouth three times a day NEURONTIN 300 MG CAPS (GABAPENTIN) Take 1 tablet by mouth three times a day TRAMADOL-ACETAMINOPHEN 37.5-325 MG TABS (TRAMADOL-ACETAMINOPHEN) Take 1 tablet by mouth once a day as needed for pain. METOPROLOL TARTRATE 25 MG TABS (METOPROLOL TARTRATE) Take 1 tablet by mouth two times a day ONDANSETRON HCL 4 MG TABS (ONDANSETRON HCL) Take 1 tab every 4 hours as needed for nausea.   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  Substance use:Hx of cocaine in the past Insurance:Medicaid  FAMILY HISTORY Significant for DM   ROS: As per HPI  VITALS: T: 97.4 P:110  BP:130/80  R:18  O2SAT:96%  ON:RA  PHYSICAL EXAM: General:  alert, well-developed, in moderate distress and cooperative to examination.   Head:  normocephalic and atraumatic.   Eyes:  vision grossly intact, pupils equal, pupils round, pupils reactive to light, no injection and anicteric.   Mouth:  pharynx pink, with mild dry MM, no erythema, and no exudates.   Neck:  supple, full ROM, no thyromegaly, no JVD, and no carotid bruits.   Lungs:  normal respiratory effort, no accessory muscle use, normal breath sounds, no crackles, and no wheezes.  Heart:  Tachycardia, regular rhythm, no murmur, no gallop, and no rub.  Abdomen:  soft, positive diffuse tenderness to palpation (especially in epigastric area and LUQ), normal bowel sounds, no distention, no guarding, no rebound tenderness, no hepatomegaly, and no splenomegaly.   Msk:  no joint swelling, no joint warmth, and no redness over joints.   Pulses:  2+ DP/PT pulses bilaterally Extremities:  No cyanosis, clubbing, edema  Neurologic:  alert & oriented X3, cranial nerves II-XII intact, strength normal in all extremities, sensation intact to  light touch, and gait normal.   Skin:  turgor normal and no rashes.   Psych:  Oriented X3, memory intact for recent and remote, normally interactive, good eye contact, not anxious appearing, and not depressed appearing.   LABS:  Sodium (NA)                              138               135-145          mEq/L  Potassium (K)                            3.6               3.5-5.1          mEq/L  Chloride                                 104               96-112           mEq/L  CO2                                      23                19-32            mEq/L  Glucose                                  176        h      70-99            mg/dL  BUN                                      9                 6-23             mg/dL  Creatinine                               0.78              0.4-1.2          mg/dL  GFR, Est Non African American            >60               >60              mL/min  GFR,  Est African American                >60               >60              mL/min  Bilirubin, Total                         0.8               0.3-1.2          mg/dL  Alkaline Phosphatase                     66                39-117           U/L  SGOT (AST)                               19                0-37             U/L  SGPT (ALT)                               <8                0-35             U/L  Total  Protein                           8.3               6.0-8.3          g/dL  Albumin-Blood                            3.4        l      3.5-5.2          g/dL  Calcium                                  9.7               8.4-10.5         mg/dL   Lipase                                   32                11-59            U/L   WBC                                      13.2       h      4.0-10.5         K/uL  RBC  5.35       h      3.87-5.11        MIL/uL  Hemoglobin (HGB)                         12.1              12.0-15.0        g/dL  Hematocrit (HCT)                          36.3              36.0-46.0        %  MCV                                      67.9       l      78.0-100.0       fL  MCH -                                    22.6       l      26.0-34.0        pg  MCHC                                     33.3              30.0-36.0        g/dL  RDW                                      19.2       h      11.5-15.5        %  Platelet Count (PLT)                     556        h      150-400          K/uL  Neutrophils, %                           75                43-77            %  Lymphocytes, %                           18                12-46            %  Monocytes, %                             7                 3-12             %  Eosinophils, %  0                 0-5              %  Basophils, %                             0                 0-1              %  Neutrophils, Absolute                    9.9        h      1.7-7.7          K/uL  Lymphocytes, Absolute                    2.4               0.7-4.0          K/uL  Monocytes, Absolute                      0.9               0.1-1.0          K/uL  Eosinophils, Absolute                    0.0               0.0-0.7          K/uL  Basophils, Absolute                      0.0               0.0-0.1          K/uL  Smear Review                             SEE NOTE.    LARGE PLATELETS PRESENT   Pregnancy, Urine-POC                     NEGATIVE     Color, Urine                             AMBER      a      YELLOW    BIOCHEMICALS MAY BE AFFECTED BY COLOR  Appearance                               CLOUDY     a      CLEAR  Specific Gravity                         1.036      h      1.005-1.030  pH                                       5.5               5.0-8.0  Urine  Glucose                            100        a      NEG              mg/dL  Bilirubin                                MODERATE   a      NEG  Ketones                                  15         a      NEG               mg/dL  Blood                                    TRACE      a      NEG  Protein                                  30         a      NEG              mg/dL  Urobilinogen                             1.0               0.0-1.0          mg/dL  Nitrite                                  NEGATIVE          NEG  Leukocytes                               TRACE      a      NEG   Squamous Epithelial / LPF                FEW        a      RARE  WBC / HPF                                0-2               <3               WBC/hpf  RBC / HPF                                3-6               <3               RBC/hpf  Bacteria / HPF  FEW        a      RARE  Urine-Other                              SEE NOTE.   Amphetamins                              SEE NOTE.         NDT    NONE DETECTED  Barbiturates                             SEE NOTE.         NDT    Oversized comment, see footnote  1  Benzodiazepines                          SEE NOTE.         NDT    NONE DETECTED  Cocaine                                  SEE NOTE.         NDT    NONE DETECTED  Opiates                                  POSITIVE   a      NDT  Tetrahydrocannabinol                     SEE NOTE.         NDT    NONE DETECTED   Magnesium                                1.7               1.5-2.5                ASSESSMENT AND PLAN: (1) Abdominal pain, N/V: Pt with intractable N/V along w/ abdominal pain in epigastric and LUQ area. Pt has no fever, WBC mildly elevated or any major electrolyte abnormality.  DDx includes Acute gastroenteritis vs> PUD vs. worsening of gastroparesis vs. IBS - Will admit to regular bed for supportive care - Will keep her NPO for bowel rest, will replete electrolytes and provide antiemetics. 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 - Will consult GI for endoscopy and will continue Reglan and IV PPI. -  Also will check Vit B12 level.  2) DM2: Last HbA1c 9.3- Will put her on sliding scale with minimal dose of lantus.  3) Anxiety/Depression: Will give her Ativan.  4) CAD: Suspect no active issues  5) Leucocytosis: Chronic, no obvious source of infection, U/A neg.  6)VTE PROPH: lovenox
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
## 2010-05-26 NOTE — Assessment & Plan Note (Signed)
Summary: f/u cath lab/pcp-vega/hla   Vital Signs:  Patient profile:   35 year old female Height:      67 inches Weight:      268.2 pounds BMI:     42.16 Temp:     97.9 degrees F Pulse rate:   87 / minute BP sitting:   125 / 87  (right arm)  Vitals Entered By: Filomena Jungling NT II (May 07, 2009 11:16 AM) CC: follow-up visit Is Patient Diabetic? Yes Did you bring your meter with you today? No Pain Assessment Patient in pain? no      Nutritional Status BMI of > 30 = obese  Have you ever been in a relationship where you felt threatened, hurt or afraid?No   Does patient need assistance? Functional Status Self care Ambulation Normal   Primary Care Provider:  Laren Everts MD  CC:  follow-up visit.  History of Present Illness: 35 yr old woman with pmhx as described below comes to the clinic for follow up. Patient reports that about 2 weeks ago she was hospitalized for chest pain and had a cardiac catherization done that did not show any significant CAD. Since her discharge patient reports to be feeling well.  Patient has not follow up with Migraine clinic or Endocrinologist for possible insulin pump. Patient has both appointment pending for next month.  Patient report to be losing weight.    Preventive Screening-Counseling & Management  Alcohol-Tobacco     Smoking Status: quit     Year Quit: quit 17 yrs ago     Passive Smoke Exposure: no  Caffeine-Diet-Exercise     Does Patient Exercise: yes     Type of exercise: WALKING     Times/week: 7  Problems Prior to Update: 1)  Screening For Lipoid Disorders  (ICD-V77.91) 2)  Boils, Recurrent  (ICD-680.9) 3)  Yeast Infection  (ICD-112.9) 4)  Depression  (ICD-311) 5)  Abdominal Pain Right Lower Quadrant  (ICD-789.03) 6)  Fibromyalgia  (ICD-729.1) 7)  Leg Pain, Bilateral  (ICD-729.5) 8)  Back Pain  (ICD-724.5) 9)  Obstructive Sleep Apnea  (ICD-327.23) 10)  Diabetes Mellitus, Type II  (ICD-250.00) 11)  Chest  Pain  (ICD-786.50) 12)  Hypertension  (ICD-401.9) 13)  Coronary Artery Disease, S/p Ptca  (ICD-414.9) 14)  Microcytic Anemia  (ICD-281.9) 15)  Asthma  (ICD-493.90) 16)  Migraine Headache  (ICD-346.90) 17)  Pedal Edema  (ICD-782.3) 18)  Irregular Menstruation  (ICD-626.4) 19)  Hepatitis B, Hx of  (ICD-V12.09) 20)  Anxiety Depression  (ICD-300.4) 21)  Obesity, Morbid  (ICD-278.01)  Medications Prior to Update: 1)  Cvs Aspirin Ec 325 Mg  Tbec (Aspirin) .... Take 1 Tablet By Mouth Once A Day 2)  Plavix 75 Mg Tabs (Clopidogrel Bisulfate) .... Take 1 Tablet By Mouth Once A Day 3)  Metoprolol Succinate 50 Mg Xr24h-Tab (Metoprolol Succinate) .... Take 1 Tablet By Mouth Two Times A Day 4)  Imdur 30 Mg Tb24 (Isosorbide Mononitrate) .... Take 1 Tablet By Mouth Once A Day 5)  Avapro 300 Mg Tabs (Irbesartan) .... Take 1 Tablet By Mouth Once A Day 6)  Lantus Solostar 100 Unit/ml  Soln (Insulin Glargine) .... Inject 50 Units Subcutaneously At Bedtime and in Am 7)  Advair Diskus 100-50 Mcg/dose Misc (Fluticasone-Salmeterol) .... Inhale One Puff Two Times A Day 8)  Singulair 10 Mg Tabs (Montelukast Sodium) .... Take 1 Tablet By Mouth Once A Day 9)  Novolog Flexpen 100 Unit/ml  Soln (Insulin Aspart) .... Inject 40 Units Before  Meal Three Times A Day- Adds 15 Units To Her 40 When Blood Sugar Is > 200mg /dl 10)  Protonix 40 Mg  Tbec (Pantoprazole Sodium) .... Take 1 Tablet By Mouth Once A Day 11)  Relpax 20 Mg  Tabs (Eletriptan Hydrobromide) .... Take One Pill By Mouth If You Feel A Migraine Attack Coming On. You May Repeat in 2 Hours If Pain Persists. Max 80mg Luvenia Heller 12)  Simvastatin 10 Mg  Tabs (Simvastatin) .... Take 1 Tablet By Mouth Once A Day At Bedtime 13)  Xopenex 0.63 Mg/61ml  Nebu (Levalbuterol Hcl) .... Nebs As Needed Every 3 Hourly. 14)  Atrovent 0.03 %  Soln (Ipratropium Bromide) .... Nebs Every 3 Hourly As Needed. 15)  Lyrica 75 Mg  Caps (Pregabalin) .... Take 1 Capsule By Mouth Twice A Day 16)   Ventolin Hfa 108 (90 Base) Mcg/act  Aers (Albuterol Sulfate) .... Inhale 1-2 Puffs Every 4 -6 Hours As Needed 17)  Ferrous Sulfate 325 (65 Fe) Mg  Tbec (Ferrous Sulfate) .... Take 1 Tablet By Mouth Three Times A Day 18)  Vicodin 5-500 Mg  Tabs (Hydrocodone-Acetaminophen) .... Take 1 Tablet By Mouth Every 12 Hours As Needed For Pain 19)  Cymbalta 20 Mg Cpep (Duloxetine Hcl) .... Take One Pill By Mouth Daily 20)  Prodigy Autocode Blood Glucose  Strp (Glucose Blood) .... Use To Test Blood Sugars 6x A Day Before and After Meals 21)  Prodigy Twist Top Lancets 28g  Misc (Lancets) .... Use To Test Blood Sugar 4x Daily 22)  Pen Needles 31g X 8 Mm Misc (Insulin Pen Needle) .... Use To Inject Insulin 4 Times A Day 23)  Lorazepam 1 Mg Tabs (Lorazepam)  Current Medications (verified): 1)  Cvs Aspirin Ec 325 Mg  Tbec (Aspirin) .... Take 1 Tablet By Mouth Once A Day 2)  Plavix 75 Mg Tabs (Clopidogrel Bisulfate) .... Take 1 Tablet By Mouth Once A Day 3)  Metoprolol Succinate 50 Mg Xr24h-Tab (Metoprolol Succinate) .... Take 1 Tablet By Mouth Two Times A Day 4)  Imdur 30 Mg Tb24 (Isosorbide Mononitrate) .... Take 1 Tablet By Mouth Once A Day 5)  Avapro 300 Mg Tabs (Irbesartan) .... Take 1 Tablet By Mouth Once A Day 6)  Lantus Solostar 100 Unit/ml  Soln (Insulin Glargine) .... Inject 50 Units Subcutaneously At Bedtime and in Am 7)  Advair Diskus 100-50 Mcg/dose Misc (Fluticasone-Salmeterol) .... Inhale One Puff Two Times A Day 8)  Singulair 10 Mg Tabs (Montelukast Sodium) .... Take 1 Tablet By Mouth Once A Day 9)  Novolog Flexpen 100 Unit/ml  Soln (Insulin Aspart) .... Inject 40 Units Before Meal Three Times A Day- Adds 15 Units To Her 40 When Blood Sugar Is > 200mg /dl 10)  Protonix 40 Mg  Tbec (Pantoprazole Sodium) .... Take 1 Tablet By Mouth Once A Day 11)  Relpax 20 Mg  Tabs (Eletriptan Hydrobromide) .... Take One Pill By Mouth If You Feel A Migraine Attack Coming On. You May Repeat in 2 Hours If Pain Persists.  Max 80mg /24hrs 12)  Simvastatin 10 Mg  Tabs (Simvastatin) .... Take 1 Tablet By Mouth Once A Day At Bedtime 13)  Xopenex 0.63 Mg/41ml  Nebu (Levalbuterol Hcl) .... Nebs As Needed Every 3 Hourly. 14)  Atrovent 0.03 %  Soln (Ipratropium Bromide) .... Nebs Every 3 Hourly As Needed. 15)  Lyrica 75 Mg  Caps (Pregabalin) .... Take 1 Capsule By Mouth Twice A Day 16)  Ventolin Hfa 108 (90 Base) Mcg/act  Aers (Albuterol Sulfate) .... Inhale 1-2  Puffs Every 4 -6 Hours As Needed 17)  Ferrous Sulfate 325 (65 Fe) Mg  Tbec (Ferrous Sulfate) .... Take 1 Tablet By Mouth Three Times A Day 18)  Vicodin 5-500 Mg  Tabs (Hydrocodone-Acetaminophen) .... Take 1 Tablet By Mouth Every 12 Hours As Needed For Pain 19)  Prodigy Autocode Blood Glucose  Strp (Glucose Blood) .... Use To Test Blood Sugars 6x A Day Before and After Meals 20)  Prodigy Twist Top Lancets 28g  Misc (Lancets) .... Use To Test Blood Sugar 4x Daily 21)  Pen Needles 31g X 8 Mm Misc (Insulin Pen Needle) .... Use To Inject Insulin 4 Times A Day  Allergies: 1)  ! 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  Risk Factors: Exercise: yes (05/07/2009)  Risk Factors: Smoking Status: quit (05/07/2009) Passive Smoke Exposure: no  (05/07/2009)  Social History: Reviewed history from 04/01/2007 and no changes required. 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  Review of Systems       The patient complains of headaches.  The patient denies fever, chest pain, dyspnea on exertion, peripheral edema, hemoptysis, abdominal pain, melena, hematochezia, hematuria, muscle weakness, and difficulty walking.    Physical Exam  General:  NAD, well-developed.   Mouth:  pharynx pink and moist.   Neck:  supple.   Lungs:  Normal respiratory effort, chest expands symmetrically. Lungs are clear to auscultation, no crackles or wheezes. Heart:  Normal rate and regular rhythm. S1 and S2 normal without gallop, murmur, click, rub or other extra sounds. Abdomen:  obese, soft, non-tender, and normal bowel sounds.   Msk:  normal ROM.   Pulses:  2+ pulses throughout Extremities:  no edema Neurologic:  alert & oriented X3, cranial nerves II-XII intact, strength normal in all extremities, sensation intact to light touch, and gait normal.   Psych:  normally interactive.     Impression & Recommendations:  Problem # 1:  DIABETES MELLITUS, TYPE II (ICD-250.00) Pending appointment with Endocrinologist for possible placement of insulin pump. Patient instructed to increase Lantus to 55 units subcutaneously two times a day. Patient never brings meter with her although she is instructed to do so repeatedly. Unsure if patient even compliant with medication. Will follow up. It has been explained to patient adverse effects of uncontrolled diabetes.  Her updated medication list for this problem includes:    Cvs Aspirin Ec 325 Mg Tbec (Aspirin) .Marland Kitchen... Take 1 tablet by mouth once a day    Avapro 300 Mg Tabs (Irbesartan) .Marland Kitchen... Take 1 tablet by mouth once a day    Lantus Solostar 100 Unit/ml Soln (Insulin glargine) .Marland KitchenMarland KitchenMarland KitchenMarland Kitchen  Inject 55 units subcutaneously  at bedtime and in am    Novolog Flexpen 100 Unit/ml Soln (Insulin aspart) ..... Inject 40 units before meal three times a day- adds 15 units to her 40 when blood sugar is > 200mg /dl  Orders: T-Hgb Z6X (in-house) ([id number]) T-Lipid Profile [phone])  Labs Reviewed: Creat: 0.86 (10/30/2008)    Reviewed HgBA1c results: >14.0 (05/07/2009)  >14.0 (10/30/2008)  Problem # 2:  BACK PAIN (ICD-724.5) Stable. Will check UDS on follow up.  Her updated medication list for this problem includes:    Cvs Aspirin Ec 325 Mg Tbec (Aspirin) .Marland Kitchen... Take 1 tablet by mouth once a day    Vicodin 5-500 Mg Tabs (Hydrocodone-acetaminophen) .Marland Kitchen... Take 1 tablet by mouth every 12 hours as needed for pain  Problem # 3:  HYPERTENSION (ICD-401.9) Controlled. Continue current regimen.  Her updated medication list for this problem includes:    Metoprolol Succinate 50 Mg Xr24h-tab (Metoprolol succinate) .Marland Kitchen... Take 1 tablet by mouth two times a day    Avapro 300 Mg Tabs (Irbesartan) .Marland Kitchen... Take 1 tablet by mouth once a day  BP today: 125/87 Prior BP: 141/96 (11/30/2008)  Labs Reviewed: K+: 4.5 (10/30/2008) Creat: : 0.86 (10/30/2008)     Problem # 4:  ASTHMA (ICD-493.90) Stable. Continue current regimen.  Her updated medication list for this problem includes:    Advair Diskus 100-50 Mcg/dose Misc (Fluticasone-salmeterol) ..... Inhale one puff two times a day    Singulair 10 Mg Tabs (Montelukast sodium) .Marland Kitchen... Take 1 tablet by mouth once a day    Xopenex 0.63 Mg/77ml Nebu (Levalbuterol hcl) ..... Nebs as needed every 3 hourly.    Ventolin Hfa 108 (90 Base) Mcg/act Aers (Albuterol sulfate) ..... Inhale 1-2 puffs every 4 -6 hours as needed  Problem # 5:  MIGRAINE HEADACHE (ICD-346.90) Headache clinic evaluation pending.  Her updated medication list for this problem includes:    Cvs Aspirin Ec 325 Mg Tbec (Aspirin) .Marland Kitchen... Take 1 tablet by mouth once a day    Metoprolol Succinate 50 Mg Xr24h-tab (Metoprolol  succinate) .Marland Kitchen... Take 1 tablet by mouth two times a day    Relpax 20 Mg Tabs (Eletriptan hydrobromide) .Marland Kitchen... Take one pill by mouth if you feel a migraine attack coming on. you may repeat in 2 hours if pain persists. max 80mg /24hrs    Vicodin 5-500 Mg Tabs (Hydrocodone-acetaminophen) .Marland Kitchen... Take 1 tablet by mouth every 12 hours as needed for pain  Complete Medication List: 1)  Cvs Aspirin Ec 325 Mg Tbec (Aspirin) .... Take 1 tablet by mouth once a day 2)  Plavix 75 Mg Tabs (Clopidogrel bisulfate) .... Take 1 tablet by mouth once a day 3)  Metoprolol Succinate 50 Mg Xr24h-tab (Metoprolol succinate) .... Take 1 tablet by mouth two times a day 4)  Imdur 30 Mg Tb24 (Isosorbide mononitrate) .... Take 1 tablet by mouth once a day 5)  Avapro 300 Mg Tabs (Irbesartan) .... Take 1 tablet by mouth once a day 6)  Lantus Solostar 100 Unit/ml Soln (Insulin glargine) .... Inject 55 units subcutaneously at bedtime and in am 7)  Advair Diskus 100-50 Mcg/dose Misc (Fluticasone-salmeterol) .... Inhale one puff two times a day 8)  Singulair 10 Mg Tabs (Montelukast sodium) .... Take 1 tablet by mouth once a day 9)  Novolog Flexpen 100 Unit/ml Soln (Insulin aspart) .... Inject 40 units before meal three times a day- adds 15 units to her 40 when blood sugar is > 200mg /dl 10)  Protonix 40 Mg Tbec (Pantoprazole  sodium) .... Take 1 tablet by mouth once a day 11)  Relpax 20 Mg Tabs (Eletriptan hydrobromide) .... Take one pill by mouth if you feel a migraine attack coming on. you may repeat in 2 hours if pain persists. max 80mg /24hrs 12)  Simvastatin 10 Mg Tabs (Simvastatin) .... Take 1 tablet by mouth once a day at bedtime 13)  Xopenex 0.63 Mg/80ml Nebu (Levalbuterol hcl) .... Nebs as needed every 3 hourly. 14)  Atrovent 0.03 % Soln (Ipratropium bromide) .... Nebs every 3 hourly as needed. 15)  Lyrica 75 Mg Caps (Pregabalin) .... Take 1 capsule by mouth twice a day 16)  Ventolin Hfa 108 (90 Base) Mcg/act Aers (Albuterol  sulfate) .... Inhale 1-2 puffs every 4 -6 hours as needed 17)  Ferrous Sulfate 325 (65 Fe) Mg Tbec (Ferrous sulfate) .... Take 1 tablet by mouth three times a day 18)  Vicodin 5-500 Mg Tabs (Hydrocodone-acetaminophen) .... Take 1 tablet by mouth every 12 hours as needed for pain 19)  Prodigy Autocode Blood Glucose Strp (Glucose blood) .... Use to test blood sugars 6x a day before and after meals 20)  Prodigy Twist Top Lancets 28g Misc (Lancets) .... Use to test blood sugar 4x daily 21)  Pen Needles 31g X 8 Mm Misc (Insulin pen needle) .... Use to inject insulin 4 times a day  Patient Instructions: 1)  Please schedule a follow-up appointment in 3 months. 2)  Remember to increase Lantus to 55 units subcutaneously twice a day. 3)  Go to Endocrinology referral and Migraine clinic referral. 4)  Continue doing exercise and eating healthier. 5)  Take all medication as directed. 6)  You will be called with any abnormalities in the tests scheduled or performed today.  If you don't hear from [place] within a week from when the test was performed, you can assume that your test was normal.  7)  Check your blood sugars regularly. If your readings are usually above : 600 or below 70 you should contact our office. 8)  Check your feet each night for sore areas, calluses or signs of infection. Prescriptions: VENTOLIN HFA 108 (90 BASE) MCG/ACT  AERS (ALBUTEROL SULFATE) Inhale 1-2 puffs every 4 -6 hours as needed  #1 x 11   Entered and Authorized by:   Laren Everts MD   Signed by:   Laren Everts MD on 05/07/2009   Method used:   [facility]  [address] [phone]* (retail)       [address]       [place], [place]  [address]       Ph: [phone] or [phone]       Fax: [phone]   RxID:   [id number] ADVAIR DISKUS 100-50 MCG/DOSE MISC (FLUTICASONE-SALMETEROL) Inhale one puff two times a day  #1 mo supply x 11   Entered and Authorized  by:   Laren Everts MD   Signed by:   Laren Everts MD on 05/07/2009   Method used:   [facility]        [facility]  [address] [phone]* (retail)       [address]       [place], [place]  [id number]       Ph: [phone] or [phone]       Fax: [phone]   RxID:   [id number]  Prior Authorization for  Advair Diskus 100-50 micrograms approved 05/10/2009 thru 05/11/2011. Angelina Ok RN  May 11, 2009 3:52 PM   Prevention & Chronic Care Immunizations   Influenza vaccine: Not documented    Tetanus booster: Not documented    Pneumococcal vaccine: Not documented  Other Screening   Pap smear:  Specimen Adequacy: Satisfactory for evaluation.   Interpretation/Result:Negative for intraepithelial Lesion or Malignancy.   Interpretation/Result:Fungal organisms c/w Candida present.      (08/09/2007)   Pap smear due: 08/2008   Smoking status: quit  (05/07/2009)  Diabetes Mellitus   HgbA1C: >14.0  (05/07/2009)    Eye exam: Not documented    Foot exam: yes  (08/12/2008)   High risk foot: No  (08/12/2008)   Foot care education: Not documented    Urine microalbumin/creatinine ratio: 30.9  (08/12/2008)    Diabetes flowsheet reviewed?: Yes   Progress toward A1C goal: Unchanged  Lipids   Total Cholesterol: Not documented   Lipid panel action/deferral: Lipid Panel ordered   LDL: Not documented   LDL Direct: Not documented   HDL: Not documented   Triglycerides: Not documented  Hypertension   Last Blood Pressure: 125 / 87  (05/07/2009)   Serum creatinine: 0.86  (10/30/2008)   Serum potassium 4.5  (10/30/2008)    Hypertension flowsheet reviewed?: Yes   Progress toward BP goal: At goal  Self-Management Support :   Personal Goals (by the next clinic visit) :     Personal A1C goal: 10  (05/07/2009)     Personal blood pressure goal: 130/80  (05/07/2009)     Personal LDL goal: 100  (05/07/2009)    Patient will work on  the following items until the next clinic visit to reach self-care goals:     Medications and monitoring: take my medicines every day, check my blood sugar  (05/07/2009)     Eating: drink diet soda or water instead of juice or soda, eat more vegetables, use fresh or frozen vegetables, eat foods that are low in salt, eat baked foods instead of fried foods, eat fruit for snacks and desserts, limit or avoid alcohol  (05/07/2009)     Activity: take a 30 minute walk every day  (05/07/2009)    Diabetes self-management support: Written self-care plan  (05/07/2009)   Diabetes care plan printed   Last diabetes self-management training by diabetes educator: 08/12/2008    Hypertension self-management support: Written self-care plan  (05/07/2009)   Hypertension self-care plan printed.  Process Orders Check Orders Results:     [facility]: ABN not required for this insurance Tests Sent for requisitioning (May 12, 2009 12:28 AM):     05/07/2009: [facility] -- T-Lipid Profile [phone] (signed)    Laboratory Results   Blood Tests   Date/Time Received: May 07, 2009 12:19 PM Date/Time Reported: Alric Quan  May 07, 2009 12:19 PM  HGBA1C: >14.0%   (Normal Range: Non-Diabetic - 3-6%   Control Diabetic - 6-8%)

## 2010-05-26 NOTE — Assessment & Plan Note (Signed)
Summary: Soc. Work  Soc. Work.  30 minutes.  Multiple medical issues. Hx of heart attack, diabetes, fibromyalgia, sleep apnea (see chart).     34 yo AA female with 37 yo and 23 yo children who will be attending [employer]. Lives in public housing [facility]) and right now her child support has not been paid.  (Child support was $180 per month)  She has no money coming in right now.   Disability is pending with [employer].  Had hearing June 2011 and unclear what the disposition was.   She has an opportunity to move into a more medically appropriate housing situation on [place] and [place] but needs her utility bills of $180.15 ([employer]) by Aug 19th and $139 gas paid.   Social supports are limited.  Mother is drug abusing and father in prison.  She is estranged from her siblings.   Has CRS agency/ Arman Bogus as comm support case manager who is trying to help her with utilities.   Patient basically has no money for anything.  No income coming in.  Social Work gave her a bag filled with soaps, toothpaste, wash cloths, waters etc. etc.  Told her to contact [employer] for back to school supplies which she was willing to do as soon as possible.   She has exhausted almost all avenues including [employer] (used up $1,000 lifetime limit), DSS, [employer], churches)  Has appmt in Nov for [employer].   SW will look into gift card for school supplies Public relations account executive) as well as call [employer] (my parish) for fin. assistance.   The patient cannot move to [place] apartment until bills are paid.    Appended Document: Soc. Work I contacted [employer] and they do not have fin. assistance program for utilities.  I am calling patient today regarding [employer] giftcard.  We have taken $40 out of [facility] to assist her.

## 2010-05-26 NOTE — Progress Notes (Signed)
Summary: med refill/gp  Phone Note Refill Request Message from:  Patient on May 25, 2009 1:58 PM  Refills Requested: Medication #1:  VICODIN 5-500 MG  TABS Take 1 tablet by mouth every 12 hours as needed for pain   Last Refilled: 06/26/2009  Method Requested: Telephone to Pharmacy Initial call taken by: Chinita Pester RN,  May 25, 2009 1:58 PM    Prescriptions: VICODIN 5-500 MG  TABS (HYDROCODONE-ACETAMINOPHEN) Take 1 tablet by mouth every 12 hours as needed for pain  #60 x 0   Entered and Authorized by:   Laren Everts MD   Signed by:   Laren Everts MD on 05/25/2009   Method used:   [facility]phoned to ...       [facility]  [address] [phone]* (retail)       [address]       [place], [place]  [address]       Ph: [phone] or [phone]       Fax: [phone]   RxID:   [id number]   Appended Document: med refill/gp [facility]

## 2010-05-26 NOTE — Progress Notes (Signed)
Summary: Telephone Call  Phone Note Outgoing Call   Call placed by: Angelina Ok RN,  April 11, 2010 5:00 PM Call placed to: Patient Summary of Call: Attempts to reach pt to inform her of the need to pick up a prescription at the pharmacy for her H-pylorie.  Message was left with a female at a contact number to have pt go to her pharmacy to pick up the medication and to call the Clinics. Angelina Ok RN  April 11, 2010 5:01 PM  Initial call taken by: Angelina Ok RN,  April 11, 2010 5:01 PM  Follow-up for Phone Call        Thank you for the call. And sorry for the inconvinience. As I checked back on floor after calling you and patient was still in hospital and so updated her medication list and told her to pick it up from the pharmacy. Any ways thank you very much for your immediate help. Ravi    New/Updated Medications: AMOXICILLIN 500 MG CAPS (AMOXICILLIN) Please take 1 capsule two times a day by mouth CLARITHROMYCIN 500 MG TABS (CLARITHROMYCIN) 1 tab by mouth two times a day till all tablets are completed Prescriptions: CLARITHROMYCIN 500 MG TABS (CLARITHROMYCIN) 1 tab by mouth two times a day till all tablets are completed  #24 x 0   Entered and Authorized by:   Lyn Hollingshead   Signed by:   Lyn Hollingshead on 04/11/2010   Method used:   [facility]        [facility] [phone]* (retail)       [address] [place]  [address]       Ph: [phone] or [phone]       Fax: [phone]   RxID:   [id number] AMOXICILLIN 500 MG CAPS (AMOXICILLIN) Please take 1 capsule two times a day by mouth  #24 x 0   Entered and Authorized by:   Lyn Hollingshead   Signed by:   Lyn Hollingshead on 04/11/2010   Method used:   [facility]        [facility] [phone]* (retail)       [address]       [place], [place]  [address]       Ph: [phone] or [phone]       Fax: [phone]   RxID:   [id number]

## 2010-05-26 NOTE — Miscellaneous (Signed)
Summary: hospital admission  INTERNAL MEDICINE ADMISSION HISTORY AND PHYSICAL  Admission Date: 04/06/2010 Attending Physician: Dr. Ulyess Mort First Contact:  Dr Allena Katz [phone] Second contact: Dr Dr Eben Burow [phone]  Weekends, [place], or after 5pm Weekdays:  1st Contact: [phone] 2nd Contact: [phone]  PCP: Lendon Ka  GN:[id number] and Abdominal Pain  HPI: This is a 35 year old female with PMH significant for recurrent N/V and Abdominal pain, CAD, DM  who presented with N/V and Abdominal pain of 3 day duration. Patient noted that she started to have 4-5 episodes  of vomiting since "Sunday. Emesis are non-bloody, non-bilious, mucous like, occasionally green in color. It was not aggravated by anything and not alleviated by anything. Zofran and Phenergan did not relieve the nausea or vomiting. Patient also noted some diarrhea which started today: loose, nonbloody, explosive bowel movements. Furthermore patient noted left lower quadrant pain which has been persistent. She is currently only eating jelly and clear liquid diet which she was tolerating on off . On Sunday pt did not change her diet. Patient reported to have chest pain, retrosternal,  squeezing/sharp in character, 8/10 in severity, non radiating but associated with SOB. Aggravated with vomiting and relieved after rest. Patient reports dizziness and chills since yesterday.   Off note: Since last discharged on 11/10 she had 2 ED visits for the same problem.   ALLERGIES: LISINOPRIL  causes vomiting  PAST MEDICAL HISTORY: 1. Hx of Nausea/Vomiting and Abdominal pain unclear ethnology with multiple admission and ED visits. Work up including Abdominal xray showed mostly nonspecific bowel gas pattern, CT abdomen with and without contrast (01/2010) showed no acute process. Gastric Emptying scan (01/2010) was normal. Ultrasound of the abdomen was within normal limits. Hepatitis B viral load was undetectable. HIV  nonreactive.TSH within normal limits.  EGD (03/02/2010) showed mild gastritis but biopsy was positive for H.pylori. Patient was started on therapy including Pylera (metronidazole, tetracycline, bismuth) and Protonix.  2. Microcytic Anemia, chronic . Baseline  around 10 due to iron deficiency currently on Ferrous gluconate , Hx of heavy menstrual bleeding. Hx of heterozygous Hemoglobin C but no documentation to confirm diagnosis ( no electrophoresis) 3. Thrombocytosis, chronic due to iron deficiency 4. Leukocytosis, chronic unclear ethnology  LAP elevated suggestive of a reactive process, Hem/Onc noted CML highly unlikely 5. CAD- s/p Subendocardial MI with PDA angioplasty(no stent) on 06/15/06 and relook cath 06/19/06 showed patency of site. Cath 12/10- no restenosis or significant CAD progression 6. Hx of Asthma:since age 8years 7. Hx of Migraines  8. Diabetes mellitus, type II Uncontrolled:Dx 9/06- Hbg A1c 9.3 ( 03/14/2010) 9. Hypertension 10. Hepatitis B, hx of: Hep BeAb+,Hep B cAb+ & Hep BsAg+ (9/06), but Hepatitis viral load undetectable (02/2010) 11. Depression 12. H/o Irregular periods:Referred to [facility]                                 13" . H/o GrpB Pyelonephritis (9/06) and UTI- 07/11- E.Coli, 12/10- GBS 14. Vaginal Abscess- 10/09- Abundant S. aureus- sensitive to all abx    MEDICATIONS: [facility] ASPIRIN EC 325 MG  TBEC (ASPIRIN) Take 1 tablet by mouth once a day LANTUS SOLOSTAR 100 UNIT/ML SOLN (INSULIN GLARGINE) Inject 15 units subcutaneously once a day until your next appointment.     PRODIGY TWIST TOP LANCETS 28G  MISC (LANCETS) use to test blood sugar 4x daily     PRODIGY AUTOCODE BLOOD GLUCOSE  STRP (GLUCOSE BLOOD) use to test blood sugars  6x a day before and after meals     PEN NEEDLES 31G X 8 MM MISC (INSULIN PEN NEEDLE) use to inject insulin 4 times a day FERROUS GLUCONATE 324 (38 FE) MG TABS (FERROUS GLUCONATE) Take one tablet by mouth two times a day on an empty stomach or if not  possible take with water. NEURONTIN 300 MG CAPS (GABAPENTIN) Take 1 tablet by mouth three times a day TRAMADOL-ACETAMINOPHEN 37.5-325 MG TABS (TRAMADOL-ACETAMINOPHEN) Take 1 tablet by mouth once a day as needed for pain. METOPROLOL TARTRATE 25 MG TABS (METOPROLOL TARTRATE) Take 1 tablet by mouth two times a day ONDANSETRON HCL 4 MG TABS (ONDANSETRON HCL) Take 1 tab every 4 hours as needed for nausea. PROMETHAZINE HCL 12.5 MG TABS (PROMETHAZINE HCL) Take one tablet by mouth every 6 hours as needed for nausea and vomiting HYOSCYAMINE SULFATE 0.125 MG TABS (HYOSCYAMINE SULFATE) Take one tablet by mouth every 4 hours as needed for cramping OMEPRAZOLE 40 MG CPDR (OMEPRAZOLE) Take one tablet by mouth two times a day   SOCIAL HISTORY: Patient lives in [place]. Has 2 children (64 y and 20 y). Currently unemployed. 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 Has Medicaid.  Smoking: previous smoker  Alcohol: occasional Illicit drug: Last in 10/2009, mostly cocaine.   FAMILY HISTORY Grandfather died of MI at age 41. Several uncles with CAD. States "all" her family members have a diagnosis of HTN and diabetes.    ROS:as per HPI. Patient denies weight loss, diaphoresis, headache, sick contact.    VITALS: T:  98.3 P: 101  BP:134/103  R:18  O2SAT:100%  ON:RA  PHYSICAL EXAM: Gen: Patient is in NAD, Pleasant. Eyes: PERRL, EOMI, No signs of anemia or jaundince. ENT: MM dry, OP clear, No erythema, thrush or exudates. Neck: Supple, No carotid Bruits, , No thyromegaly Resp: CTA- Bilaterally, No W/C/R. CVS: tachycardic regular, S1S2, No M/R/G GI: Abdomen is soft. Distended. LLQ tender to palpation. NG, NR, BS+.       Ext: No pedal edema, cyanosis or clubbing. Skin: No visible rashes, scars. Lymph: No palpable lymphadenopathy. MS: Moving all 4 extremities. Neuro: A&O X3, CN II - XII are grossly intact. Motor strength is 5/5 in the all 4 extremities,  Sensations intact to light touch. Psych: Appropriate   LABS:  WBC                                      13.5       h      4.0-10.5         K/uL  RBC                                      5.30       h      3.87-5.11        MIL/uL  Hemoglobin (HGB)                         11.8       l      12.0-15.0        g/dL  Hematocrit (HCT)  36.1              36.0-46.0        %  MCV                                      68.1       l      78.0-100.0       fL  MCH -                                    22.3       l      26.0-34.0        pg  MCHC                                     32.7              30.0-36.0        g/dL  RDW                                      19.1       h      11.5-15.5        %  Platelet Count (PLT)                     560        h      150-400          K/uL  Neutrophils, %                           70                43-77            %  Lymphocytes, %                           22                12-46            %  Monocytes, %                             8                 3-12             %  Eosinophils, %                           0                 0-5              %  Basophils, %                             0  0-1              %  Neutrophils, Absolute                    9.4        h      1.7-7.7          K/uL  Lymphocytes, Absolute                    3.0               0.7-4.0          K/uL  Monocytes, Absolute                      1.1        h      0.1-1.0          K/uL  Eosinophils, Absolute                    0.0               0.0-0.7          K/uL  Basophils, Absolute                      0.0               0.0-0.1          K/uL  RBC Morphology                           SEE NOTE.   Sodium (NA)                              130        l      135-145          mEq/L  Potassium (K)                            5.5        h      3.5-5.1          mEq/L  Chloride                                 102               96-112           mEq/L  CO2                                       21                19-32            mEq/L  Glucose                                  154        h      70-99            mg/dL  BUN  7                 6-23             mg/dL  Creatinine                               0.82              0.4-1.2          mg/dL  GFR, Est Non African American            >60               >60              mL/min  GFR, Est African American                >60               >60              mL/min    Oversized comment, see footnote  1  Calcium                                  9.4               8.4-10.5         mg/dL   Creatine Kinase, Total                   110               7-177            U/L  CK, MB                                   0.7               0.3-4.0          ng/mL  Relative Index                           0.6               0.0-2.5   Lipase                                   30                11-59            U/L   Troponin I                               0.03              0.00-0.06        ng/mL    NO INDICATION OF    MYOCARDIAL INJURY.  Pregnancy, Urine-POC                     NEGATIVE  Color, Urine  AMBER      a      YELLOW    BIOCHEMICALS MAY BE AFFECTED BY COLOR  Appearance                               CLOUDY     a      CLEAR  Specific Gravity                         1.028             1.005-1.030  pH                                       6.5               5.0-8.0  Urine Glucose                            100        a      NEG              mg/dL  Bilirubin                                MODERATE   a      NEG  Ketones                                  >80        a      NEG              mg/dL  Blood                                    LARGE      a      NEG  Protein                                  100        a      NEG              mg/dL  Urobilinogen                             1.0               0.0-1.0          mg/dL  Nitrite                                  POSITIVE    a      NEG  Leukocytes                               SMALL      a      NEG  Squamous Epithelial / LPF  FEW        a      RARE  WBC / HPF                                3-6               <3               WBC/hpf  RBC / HPF                                SEE NOTE.         <3               RBC/hpf    TOO NUMEROUS TO COUNT  Bacteria / HPF                           RARE              RARE  Urine-Other                              SEE NOTE.    MUCOUS PRESENT  Acute Abdominal Series:  Findings: The lungs are clear.  Mediastinal contours appear normal.   The heart is within normal limits in size.  No bony abnormality is   seen.    Supine and erect views of the abdomen show a relative paucity of   bowel gas.  No bowel distention is seen.  No free air is noted.  No   bony abnormality is seen.    IMPRESSION:    1.  No active lung disease.   2.  No bowel obstruction.  No free air.  Paucity of bowel gas.  ASSESSMENT AND PLAN:  1. Nausea/ vomiting and abdominal pain unclear ethnology at this point. Extensive work up including>  EGD (03/02/2010) showed mild gastritis but biopsy was positive for H.pylori. Patient was started on therapy including Pylera (metronidazole, tetracycline, bismuth) and Protonix. Per rec. pt did not start therapy until 11/29 but pt noted she started to take it on the 11/18 and she noted that she still not complete her dosage. Normal length of therapy would be 2 weeks for H. pylori. If H.pylori is persistent it may cause current symptoms in setting of gastritis. Gastric Emptying scan (01/2010) was normal. Ultrasound of the abdomen was within normal limits, except for 0.5 cm echogenic structure along the gallbladder wall could represent a polyp on Oct 2011. CT abdomen with and without contrast (01/2010) showed no acute process.  Abdominal xray on admission showed mostly nonspecific bowel gas pattern.  Hepatitis B viral load was undetectable. HIV nonreactive.TSH within  normal limits.  Reviewing when the symptoms occurs it seems that food may be an aggravating factor. DD may include at this point any kind of pathology of the gallbladder (acalculous cholecystitis, adenomyomatosis) although ultrasound and CT did not show any acute process except one polyp of 0.5 cm which can be associated with biliary pain, chronic dyspeptic abdominal pain, nausea and vomiting.  - Helicobacter pylori fecal antigen to consider if restarting therapy is necessary, C.diff toxin since pt reported diarrhea in the setting of recent antibiotic use, LFT - Morphine 2 mg q4hrs as needed for pain, Zofran and Phenergan to control nausea/vomiting,  Protonix. - consider to restart Hyoscamine as soon as pt is tolerating by mouth intake.  - consider HIDA scan and GI consult for further evaluation   2. Chest pain likely musculoskeletal due to vomiting. DD include MI  but less likely since CE neg. and ECG did not show any significant changes, Mallory-Weiss Tear/ Boerhaave Syndrome in the setting of emesis but highly unlikely with the presented signs and symptoms and  CXT negative for  free air. Substance abuse: especially cocaine can cause cardiac symptoms and pt has a history of abuse in the past. - Admitted to Telemetry - cycle CE x3, repeat ECG, UDS.  - holding Aspirin since pt is NPO   3. Hyponatremia likely due to volume depletion in the setting of emesis. DD include SIDH and Adrenal insufficiency especially in the setting of elevated K .   4. Hyperkalemia 2/2 hemolysis. If truely hyperkalemic DD includes Pseudokalemia, Hyperglycemia and adrenal insufficiency.  - NS IV  250cc/hr for 8hours then 100 cc/hr - repeat Bmet, check morning cortisol level  - orthostatic BP  5. DM, type 2. Last Hgb A1c is 9.3 on (02/21/2010).  - Lantus 10 units subcutaneously at bedtime, titrate based on CBGs.  6. HTN: holding home meds at this point. As soon pt is tolerating by mouth and volume status improved will  consider to restart Metoprolol  7. Microcytic Anemia, chronic . Baseline  around 10 due to iron deficiency currently on Ferrous gluconate , Hx of heavy menstrual bleeding. Hx of heterozygous Hemoglobin C but no documentation to confirm diagnosis ( no electrophoresis) - holding Ferrous gluconate since pt is NPO. As soon as pt is tolerating by mouth will restart.   8. Thrombocytosis, chronic due to iron deficiency  9. Leukocytosis, chronic unclear ethiology  LAP elevated suggestive of a reactive process, Hem/Onc noted CML highly unlikely  10. DVT ppx: SCD's   Attending Physician:  I have seen and examined the patient. I reviewed the resident note and agree with the findings and plan of care as documented. My additions and revisions are included.    Signature:___________________________________________

## 2010-05-26 NOTE — Progress Notes (Signed)
Summary: refill/ hla  Phone Note Refill Request Message from:  Patient on September 24, 2009 10:53 AM  Refills Requested: Medication #1:  VICODIN 5-500 MG  TABS Take 1 tablet by mouth every 12 hours as needed for pain   Last Refilled: 5/4 Initial call taken by: Marin Roberts RN,  September 24, 2009 10:54 AM    Patient needs to come to the clinic for UDS before prescription can be given.  Appended Document: refill/ hla I talked to pt. yesterday; she said she would come today for UDS.

## 2010-05-26 NOTE — Initial Assessments (Signed)
INTERNAL MEDICINE ADMISSION HISTORY AND PHYSICAL  Attending:Dr. Aundria Rud First Contact: Dr. Nino Parsley [phone] Second Contact: Dr. Aldine Contes [phone]  PCP: Dr. Marjorie Smolder  CC: Chest pain  HPI:  35 y/o woman with h/o MI, DM2 (A1C>14), HTN, and morbid obesity presenting with chest pain. She states that the pain started suddenly earlier today while in church. She describes it as sharp, located on the right side, does not radiate and is not associated with diaphoresis, although she complains of significant nausea. She took some Nitro while in church but did not relieve the pain. It is exercabated with breathing and somewhat with exertion. She denies any fevers, chills, or sob. No history of chest wall trauma. Of note, she was treated for a UTI 2 weeks ago but is still complaining of dysuria. She said she was prescribed 20 pills of Keflex ED Course: She recieved ASA 324mg , Plavix 75mg , Zofran 4mg  IV and Morphine 6mg   ALLERGIES: Allergies: ! 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 Microcytic Anemia with heterozygous Hemoglobin C-Baseline Hgb 8 Asthma:since age 74years Migraines on prophylaxis Diabetes mellitus, type II:Dx 9/06 (A1C>14 on 08/30/09) Hypertension Hepatitis B, hx of: Hep BeAb+,Hep B cAb+ & Hep BsAg+ (9/06) Morbid obesity H/o Irregular peroids:Referred to [facility]                                  Small ovarian follicles seen on CT(9/06) H/o GrpB Pyelonephritis (9/06) Depression   MEDICATIONS: Current Meds:  [facility] ASPIRIN EC 325 MG  TBEC (ASPIRIN) Take 1 tablet by mouth once a day PLAVIX 75 MG TABS (CLOPIDOGREL BISULFATE) Take 1 tablet by mouth once a day METOPROLOL SUCCINATE 50 MG XR24H-TAB (METOPROLOL SUCCINATE) Take 1 tablet by mouth two times a day IMDUR 30 MG TB24 (ISOSORBIDE MONONITRATE) Take 1 tablet by mouth once a day COZAAR 100 MG TABS  (LOSARTAN POTASSIUM) Take 1 tablet by mouth once a day LANTUS SOLOSTAR 100 UNIT/ML  SOLN (INSULIN GLARGINE) Inject 85 units subcutaneously two times a day ADVAIR DISKUS 100-50 MCG/DOSE MISC (FLUTICASONE-SALMETEROL) Inhale one puff two times a day SINGULAIR 10 MG TABS (MONTELUKAST SODIUM) Take 1 tablet by mouth once a day NOVOLOG FLEXPEN 100 UNIT/ML  SOLN (INSULIN ASPART) inject 55 units before meal three times a day PROTONIX 40 MG  TBEC (PANTOPRAZOLE SODIUM) Take 1 tablet by mouth once a day RELPAX 20 MG  TABS (ELETRIPTAN HYDROBROMIDE) Take one pill by mouth if you feel a migraine attack coming on. You may repeat in 2 hours if pain persists. Max 80mg /24hrs SIMVASTATIN 10 MG  TABS (SIMVASTATIN) Take 1 tablet by mouth once a day at bedtime XOPENEX 0.63 MG/3ML  NEBU (LEVALBUTEROL HCL) nebs as needed every 3 hourly. ATROVENT 0.03 %  SOLN (IPRATROPIUM BROMIDE) nebs every 3 hourly as needed. LYRICA 75 MG  CAPS (PREGABALIN) Take 1 capsule by mouth twice a day VENTOLIN HFA 108 (90 BASE) MCG/ACT  AERS (ALBUTEROL SULFATE) Inhale 1-2 puffs every 4 -6 hours as needed FERROUS SULFATE 325 (65 FE) MG  TBEC (FERROUS SULFATE) Take 1 tablet by mouth three times a day VICODIN 5-500 MG  TABS (HYDROCODONE-ACETAMINOPHEN) Take 1 tablet by mouth every 12 hours as needed for pain PRODIGY AUTOCODE BLOOD GLUCOSE  STRP (GLUCOSE BLOOD) use to test blood sugars 6x a day before and after meals PRODIGY TWIST TOP LANCETS 28G  MISC (LANCETS)  use to test blood sugar 4x daily PEN NEEDLES 31G X 8 MM MISC (INSULIN PEN NEEDLE) use to inject insulin 4 times a day NEURONTIN 300 MG CAPS (GABAPENTIN) Take 1 tablet by mouth three times a day AMBIEN 5 MG TABS (ZOLPIDEM TARTRATE) Take 1 tablet by mouth at bedtime   SOCIAL HISTORY: Social History: 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  FAMILY HISTORY Family History:  Grandfather died of MI at age 37. Several uncles with CAD. States "all" her family members have a diagnosis of HTN and diabetes.  ROS: VITALS: T:  P: 88 BP:119/80  R: 14 O2SAT:100  ON:RA PHYSICAL EXAM: General:  alert, well-developed, and cooperative to examination.   Head:  normocephalic and atraumatic.   Eyes:  vision grossly intact, pupils equal, pupils round, pupils reactive to light, no injection and anicteric.   Mouth:  pharynx pink and moist, no erythema, and no exudates. Neck:  supple, full ROM, no thyromegaly, no JVD, and no carotid bruits.   Lungs:  normal respiratory effort, no accessory muscle use, normal breath sounds, no crackles, and no wheezes.  Heart:  Normal rate, regular rhythm, no murmur, no gallop, and no rub.  Chest wall is tender to palpation along the right sternal border.  Abdomen:  soft, non-tender, normal bowel sounds, no distention, no guarding, no rebound tenderness, no hepatomegaly, and no splenomegaly. Msk:  no joint swelling, no joint warmth, and no redness over joints.   Pulses:  2+ DP/PT pulses bilaterally Extremities:  No cyanosis, clubbing, edema  Neurologic:  alert & oriented X3, cranial nerves II-XII intact, strength normal in all extremities, sensation intact to light touch, and gait normal.   Skin:  turgor normal and no rashes.   Psych:  Oriented X3, memory intact for recent and remote, normally interactive, good eye contact, not anxious appearing, and not depressed  appearing.  LABS:  WBC                                      13.5       h      4.0-10.5         K/uL  RBC                                      5.08              3.87-5.11        MIL/uL  Hemoglobin (HGB)                         11.7       l      12.0-15.0        g/dL  Hematocrit (HCT)                         35.7       l      36.0-46.0        %  MCV  70.2       l      78.0-100.0       fL  MCH -                                    23.0       l       26.0-34.0        pg  MCHC                                     32.7              30.0-36.0        g/dL  RDW                                      18.1       h      11.5-15.5        %  Platelet Count (PLT)                     420        h      150-400          K/uL  Neutrophils, %                           65                43-77            %  Lymphocytes, %                           25                12-46            %  Monocytes, %                             7                 3-12             %  Eosinophils, %                           2                 0-5              %  Basophils, %                             1                 0-1              %  Neutrophils, Absolute                    8.8        h      1.7-7.7  K/uL  Lymphocytes, Absolute                    3.4               0.7-4.0          K/uL  Monocytes, Absolute                      0.9               0.1-1.0          K/uL  Eosinophils, Absolute                    0.3               0.0-0.7          K/uL  Basophils, Absolute                      0.1               0.0-0.1          K/uL  CKMB, POC                                <1.0       l      1.0-8.0          ng/mL  Troponin I, POC                          <0.05             0.00-0.09        ng/mL  Myoglobin, POC                           52.1              12-200           ng/mL   Sodium (NA)                              135               135-145          mEq/L  Potassium (K)                            3.8               3.5-5.1          mEq/L  Chloride                                 104               96-112           mEq/L  Glucose                                  402        h      70-99            mg/dL  BUN                                      16                6-23             mg/dL  Creatinine                               0.8               0.4-1.2          mg/dL TCO2                                     20                0-100            mmol/L  D-Dimer, Fibrin  Derivatives              <0.22             0.00-0.48        ug/mL-FEU   ASSESSMENT AND PLAN: 35 y/o woman with h/o prior MI, HTN, uncontrolled DM2 presenting with right sided chest pain.   1. Chest pain - consider ACS (given previous h/o MI, HL, poorly controlled DM2, although her age is a negative RF as well as the location of the pain, nature is sharp and reproducible, making ACS very unlikely) vs 2/2 drug use vs GERD (woman with DM2) vs MSK (location and reproducible on palpation). - admit to telemetry - obtain EKGs, cycle cardiac enzymes, CXR - f/u UDS - Check CBC, Lipid panel, Hb A1C - Cont. ASA and Plavix - Cont Protonix ? dx of GERD?- although she's on protonix, she states she does not have a diagnosis of acid reflux disease.  2. DM2 - poorly controlled with A1C of >14 on 08/30/09 - will adjust her home dose to Lantus 45 at bedtime with SSI sensitive coverage. This will help [place] indicate whether her poor numbers are 2/2 noncomplaince. - Lipid panel, Hb A1C  3. HTN - cont home meds  4. ? UTI - still complained of dysuria while on Kelflex  - will check UA  5. Asthma  - cont. home meds  6. Migrains - cont ppx  7. Depression - cont home med   Attending Physician:

## 2010-05-26 NOTE — Progress Notes (Signed)
Summary: refill/ hla  Phone Note Refill Request Message from:  Patient on April 27, 2009 12:24 PM  Refills Requested: Medication #1:  VICODIN 5-500 MG  TABS Take 1 tablet by mouth every 12 hours as needed for pain   Last Refilled: 12/1 Initial call taken by: Marin Roberts RN,  April 27, 2009 12:24 PM    Prescriptions: VICODIN 5-500 MG  TABS (HYDROCODONE-ACETAMINOPHEN) Take 1 tablet by mouth every 12 hours as needed for pain  #60 x 0   Entered and Authorized by:   Laren Everts MD   Signed by:   Laren Everts MD on 04/28/2009   Method used:   Telephoned to ...       [facility]  [address] [phone]* (retail)       [address]       [place], [place]  [address]       Ph: [phone] or [phone]       Fax: [phone]   RxID:   [id number]   Appended Document: refill/ hla Above Rx ref[facility].

## 2010-05-26 NOTE — Progress Notes (Signed)
Summary: Prior Approval-Tramadol HCL ER-denied.  Phone Note Outgoing Call   Call placed by: Angelina Ok RN,  January 25, 2010 12:06 PM Call placed to: Insurer Summary of Call: Call to [facility] for Prior Approval of Trmadol HCL ER 100 mg tablets.  Denied .Pt willl need a trail of Ultracet or regular  Tramadol first. Angelina Ok RN  January 25, 2010 12:08 PM  Initial call taken by: Angelina Ok RN,  January 25, 2010 12:08 PM

## 2010-05-26 NOTE — Progress Notes (Signed)
Summary: DSMT apppointment/dmr  Phone Note Outgoing Call   Call placed by: Jamison Neighbor,  July 19, 2007 1:43 PM Summary of Call: called patient to schedule a visit for DSMT  per physician referral. She agreed to an appointment on 07/23/07 at 2:30 PM.

## 2010-05-26 NOTE — Progress Notes (Signed)
Summary: refill/gg  Phone Note Refill Request   Refills Requested: Medication #1:  VICODIN 5-500 MG  TABS Take 1 tablet by mouth every 12 hours as needed for pain   Last Refilled: 10/01/2009  Method Requested: Telephone to Pharmacy Initial call taken by: Merrie Roof RN,  October 26, 2009 8:58 AM    Prescriptions: VICODIN 5-500 MG  TABS (HYDROCODONE-ACETAMINOPHEN) Take 1 tablet by mouth every 12 hours as needed for pain  #60 x 0   Entered and Authorized by:   Laren Everts MD   Signed by:   Laren Everts MD on 10/26/2009   Method used:   Telephoned to ...       [facility]  [address] [phone]* (retail)       [address]       [place], [place]  [address]       Ph: [phone] or [phone]       Fax: [phone]   RxID:   [id number]   Appended Document: refill/gg Rx called in

## 2010-05-26 NOTE — Progress Notes (Signed)
Summary: refill/ hla  Phone Note Refill Request Message from:  Patient on September 29, 2009 3:50 PM  Refills Requested: Medication #1:  VICODIN 5-500 MG  TABS Take 1 tablet by mouth every 12 hours as needed for pain   Last Refilled: 5/4 Initial call taken by: Marin Roberts RN,  September 29, 2009 3:50 PM    Denied. Patient not taking medication evidenced per negative UDS. Will consider changing to tramadol.

## 2010-05-26 NOTE — Letter (Signed)
Summary: [facility]  CLINIC  [facility]  CLINIC   Imported By: Margie Billet 11/01/2009 10:54:22  _____________________________________________________________________  External Attachment:    Type:   Image     Comment:   External Document

## 2010-05-26 NOTE — Progress Notes (Signed)
Summary: Refill/gh  Phone Note Refill Request Message from:  Fax from Pharmacy on November 15, 2009 3:16 PM  Refills Requested: Medication #1:  NOVOLOG FLEXPEN 100 UNIT/ML  SOLN inject 55 units before meal three times a day   Last Refilled: 04/07/2009  Medication #2:  LANTUS SOLOSTAR 100 UNIT/ML  SOLN Inject 85 units subcutaneously two times a day   Last Refilled: 04/07/2009  Method Requested: Electronic Initial call taken by: Angelina Ok RN,  November 15, 2009 3:17 PM    Prescriptions: LANTUS SOLOSTAR 100 UNIT/ML  SOLN (INSULIN GLARGINE) Inject 85 units subcutaneously two times a day  #2 vials x 6   Entered and Authorized by:   Laren Everts MD   Signed by:   Laren Everts MD on 11/15/2009   Method used:   [facility]        [facility]  [address] [phone]* (retail)       [address]       [place], [place]  [address]       Ph: [phone] or [phone]       Fax: [phone]   RxID:   [id number] NOVOLOG FLEXPEN 100 UNIT/ML  SOLN (INSULIN ASPART) inject 55 units before meal three times a day  #2 vials x 6   Entered and Authorized by:   Laren Everts MD   Signed by:   Laren Everts MD on 11/15/2009   Method used:   [facility]        [facility]  [address] [phone]* (retail)       [address]       [place], [place]  [address]       Ph: [phone] or [phone]       Fax: [phone]   RxID:   [id number]

## 2010-05-26 NOTE — Progress Notes (Signed)
Summary: refill/gg  Phone Note Refill Request  on July 23, 2009 3:38 PM  Refills Requested: Medication #1:  VICODIN 5-500 MG  TABS Take 1 tablet by mouth every 12 hours as needed for pain   Last Refilled: 06/23/2009  Method Requested: Fax to Local Pharmacy Initial call taken by: Merrie Roof RN,  July 23, 2009 3:39 PM    Prescriptions: VICODIN 5-500 MG  TABS (HYDROCODONE-ACETAMINOPHEN) Take 1 tablet by mouth every 12 hours as needed for pain  #60 x 0   Entered and Authorized by:   Laren Everts MD   Signed by:   Laren Everts MD on 07/23/2009   Method used:   Telephoned to ...       [facility]  [address] [phone]* (retail)       [address]       [place], [place]  [address]       Ph: [phone] or [phone]       Fax: [phone]   RxID:   [id number]   Appended Document: refill/gg Rx called in

## 2010-05-26 NOTE — Progress Notes (Signed)
Summary: prior authorization-Tramadol/gp  Phone Note Outgoing Call   Summary of Call: Medicaid was called about  Prior Authorization  forTramadol HCL ER 100mg  tablet.  Plain Tramadol and Tramadol/acetaminophen (Ultracet) are on the preferred list and does not require PA.  But if pt. needs Tramadol HCL ER, she needs to try the 2 preferred medications first. Please  change medication.  Thanks Initial call taken by: Chinita Pester RN,  February 28, 2010 10:05 AM    New/Updated Medications: TRAMADOL-ACETAMINOPHEN 37.5-325 MG TABS (TRAMADOL-ACETAMINOPHEN) Take 1 tablet by mouth once a day as needed for pain. Prescriptions: TRAMADOL-ACETAMINOPHEN 37.5-325 MG TABS (TRAMADOL-ACETAMINOPHEN) Take 1 tablet by mouth once a day as needed for pain.  #14 x 0   Entered and Authorized by:   Almyra Deforest MD   Signed by:   Almyra Deforest MD on 02/28/2010   Method used:   [facility]ronically to        [facility]  [address] [phone]* (retail)       [address]       [place], [place]  [address]       Ph: [phone] or [phone]       Fax: [phone]   RxID:   [id number]   Appended Document: prior authorization-Tramadol/gp Above Rx [facility] [address] and cancelled the rx on [address].

## 2010-05-26 NOTE — Miscellaneous (Signed)
  Clinical Lists Changes  Observations: Added new observation of PAST MED HX: CAD- s/p Subendocardial MI with PDA angioplasty(no stent) on 06/15/06 and relook cath 06/19/06 showed patency of site. 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 aureus- sensitive to all abx (01/26/2010 2:44)      Complete Medication List: 1)  [facility] Asp[facility]in Ec 325 Mg Tbec (Aspirin) .... Take 1 tablet by mouth once a day 2)  Lantus Solostar 100 Unit/ml Soln (Insulin glargine) .... Inject 85 units subcutaneously two times a day 3)  Prodigy Twist Top Lancets 28g Misc (Lancets) .... Use to test blood sugar 4x daily 4)  Prodigy Autocode Blood Glucose Strp (Glucose blood) .... Use to test blood sugars 6x a day before and after meals 5)  Pen Needles 31g X 8 Mm Misc (Insulin pen needle) .... Use to inject insulin 4 times a day 6)  Lyrica 75 Mg Caps (Pregabalin) .... Take 1 capsule by mouth twice a day 7)  Ventolin Hfa 108 (90 Base) Mcg/act Aers (Albuterol sulfate) .... Inhale 1-2 puffs every 4 -6 hours as needed 8)  Ferrous Sulfate 325 (65 Fe) Mg Tbec (Ferrous sulfate) .... Take 1 tablet by mouth three times a day 9)  Neurontin 300 Mg Caps (Gabapentin) .... Take 1 tablet by mouth three times a day 10)  Novolog Penfill 100 Unit/ml Soln (Insulin aspart) .... 50 units three times daily 11)  Flexeril 10 Mg Tabs (Cyclobenzaprine hcl) .... Take 1 tab by mouth at bedtime 12)  Diclofenac Potassium 50 Mg Tabs (Diclofenac  potassium) .... Take 1 tab twice daily 13)  Ultram Er 100 Mg Xr24h-tab (Tramadol hcl) .... Take 1 tablet by mouth once a day 14)  Metoprolol Tartrate 25 Mg Tabs (Metoprolol tartrate) .... Take 1 tablet by mouth two times a day   Past History:  Past Medical History: CAD- s/p Subendocardial MI with PDA angioplasty(no stent) on 06/15/06 and relook cath 06/19/06 showed patency of site. 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 aureus- sensitive to all abx

## 2010-05-26 NOTE — Discharge Summary (Signed)
Summary: Hospital Discharge Update    Hospital Discharge Update:  Date of Admission: 01/26/2010 Date of Discharge: 01/30/2010  Brief Summary:  Shirley [name] was admitted for nausea and vomiting of unknown etiology. Differential included gastroparesis vs viral gastroenteritis vs UTI. Patient had a gastric emptying study which was normal, and she also has UC positive 100,000 colonies of [facility] 10/02. Patient was started on Bactrim for UTI, but continued to complain of vomiting, though her episodes have all been unwitnessed. Patient's diet was advanced and she seemed to tolerate it, thus she was discharged home.  Other follow-up issues:  Further episodes of nausea and vomiting  Medication list changes:  Changed medication from LANTUS SOLOSTAR 100 UNIT/ML  SOLN (INSULIN GLARGINE) Inject 85 units subcutaneously two times a day to LANTUS SOLOSTAR 100 UNIT/ML  SOLN (INSULIN GLARGINE) Inject 85 units subcutaneously once a day until seen by your primary care doctor. Added new medication of ONDANSETRON HCL 4 MG TABS (ONDANSETRON HCL) Take 1 tab every 4 hours as needed for nausea. - Signed Added new medication of PROMETHAZINE HCL 12.5 MG TABS (PROMETHAZINE HCL) Take 1 tab every 4 hours as needed for nausea and vomiting - Signed Rx of ONDANSETRON HCL 4 MG TABS (ONDANSETRON HCL) Take 1 tab every 4 hours as needed for nausea.;  #15 x 0;  Signed;  Entered by: Melida Quitter MD;  Authorized by: Melida Quitter MD;  Method used: [facility] [address] [phone]*, [address], [place], [place], [place]  [address], Ph: [phone] or [phone], Fax: [phone] Rx of PROMETHAZINE HCL 12.5 MG TABS (PROMETHAZINE HCL) Take 1 tab every 4 hours as needed for nausea and vomiting;  #15 x 0;  Signed;  Entered by: Melida Quitter MD;  Authorized by: Melida Quitter MD;  Method used: Electronically to [facility] [address] [phone]*, [address], [place], [place], [place]  [address], Ph:  [phone] or [phone], Fax: [phone]  The medication, problem, and allergy lists have been updated.  Please see the dictated discharge summary for details.  Discharge medications:  [facility] ASPIRIN EC 325 MG  TBEC (ASPIRIN) Take 1 tablet by mouth once a day LANTUS SOLOSTAR 100 UNIT/ML  SOLN (INSULIN GLARGINE) Inject 85 units subcutaneously once a day until seen by your primary care doctor.     PRODIGY TWIST TOP LANCETS 28G  MISC (LANCETS) use to test blood sugar 4x daily     PRODIGY AUTOCODE BLOOD GLUCOSE  STRP (GLUCOSE BLOOD) use to test blood sugars 6x a day before and after meals     PEN NEEDLES 31G X 8 MM MISC (INSULIN PEN NEEDLE) use to inject insulin 4 times a day LYRICA 75 MG  CAPS (PREGABALIN) Take 1 capsule by mouth twice a day VENTOLIN HFA 108 (90 BASE) MCG/ACT  AERS (ALBUTEROL SULFATE) Inhale 1-2 puffs every 4 -6 hours as needed FERROUS SULFATE 325 (65 FE) MG  TBEC (FERROUS SULFATE) Take 1 tablet by mouth three times a day NEURONTIN 300 MG CAPS (GABAPENTIN) Take 1 tablet by mouth three times a day NOVOLOG PENFILL 100 UNIT/ML SOLN (INSULIN ASPART) 50 units three times daily FLEXERIL 10 MG TABS (CYCLOBENZAPRINE HCL) Take 1 tab by mouth at bedtime DICLOFENAC POTASSIUM 50 MG TABS (DICLOFENAC POTASSIUM) Take 1 tab twice daily ULTRAM ER 100 MG XR24H-TAB (TRAMADOL HCL) Take 1 tablet by mouth once a day METOPROLOL TARTRATE 25 MG TABS (METOPROLOL TARTRATE) Take 1 tablet by mouth two times a day ONDANSETRON HCL 4 MG TABS (ONDANSETRON  HCL) Take 1 tab every 4 hours as needed for nausea. PROMETHAZINE HCL 12.5 MG TABS (PROMETHAZINE HCL) Take 1 tab every 4 hours as needed for nausea and vomiting  Other patient instructions:  Please call the Outpatient clinic on Monday to verify the date and time of your appointment. Please come to the clinic or ED if you continue experience nausea and vomiting. Please take all medications as directed.   Note: Hospital Discharge Medications & Other Instructions  handout was printed, one copy for patient and a second copy to be placed in hospital chart.    Complete Medication List: 1)  [facility] Aspirin Ec 325 Mg Tbec (Aspirin) .... Take 1 tablet by mouth once a day 2)  Lantus Solostar 100 Unit/ml Soln (Insulin glargine) .... Inject 85 units subcutaneously once a day until seen by your primary care doctor. 3)  Prodigy Twist Top Lancets 28g Misc (Lancets) .... Use to test blood sugar 4x daily 4)  Prodigy Autocode Blood Glucose Strp (Glucose blood) .... Use to test blood sugars 6x a day before and after meals 5)  Pen Needles 31g X 8 Mm Misc (Insulin pen needle) .... Use to inject insulin 4 times a day 6)  Lyrica 75 Mg Caps (Pregabalin) .... Take 1 capsule by mouth twice a day 7)  Ventolin Hfa 108 (90 Base) Mcg/act Aers (Albuterol sulfate) .... Inhale 1-2 puffs every 4 -6 hours as needed 8)  Ferrous Sulfate 325 (65 Fe) Mg Tbec (Ferrous sulfate) .... Take 1 tablet by mouth three times a day 9)  Neurontin 300 Mg Caps (Gabapentin) .... Take 1 tablet by mouth three times a day 10)  Novolog Penfill 100 Unit/ml Soln (Insulin aspart) .... 50 units three times daily 11)  Flexeril 10 Mg Tabs (Cyclobenzaprine hcl) .... Take 1 tab by mouth at bedtime 12)  Diclofenac Potassium 50 Mg Tabs (Diclofenac potassium) .... Take 1 tab twice daily 13)  Ultram Er 100 Mg Xr24h-tab (Tramadol hcl) .... Take 1 tablet by mouth once a day 14)  Metoprolol Tartrate 25 Mg Tabs (Metoprolol tartrate) .... Take 1 tablet by mouth two times a day 15)  Ondansetron Hcl 4 Mg Tabs (Ondansetron hcl) .... Take 1 tab every 4 hours as needed for nausea. 16)  Promethazine Hcl 12.5 Mg Tabs (Promethazine hcl) .... Take 1 tab every 4 hours as needed for nausea and vomiting  Prescriptions: PROMETHAZINE HCL 12.5 MG TABS (PROMETHAZINE HCL) Take 1 tab every 4 hours as needed for nausea and vomiting  #15 x 0   Entered and Authorized by:   Melida Quitter MD   Signed by:   Melida Quitter MD on 01/30/2010   Method  used:   [facility]ically to        [facility]  [address] [phone]* (retail)       [address]       [place], [place]  [address]       Ph: [phone] or [phone]       Fax: [phone]   RxID:   [id number] ONDANSETRON HCL 4 MG TABS (ONDANSETRON HCL) Take 1 tab every 4 hours as needed for nausea.  #15 x 0   Entered and Authorized by:   Melida Quitter MD   Signed by:   Melida Quitter MD on 01/30/2010   Method used:   [facility]ronically to        [facility]  [address] [phone]* (retail)       [address]       [place], [place]  [address]       Ph: [phone] or [phone]       Fax: [phone]   RxID:   [id number]

## 2010-05-26 NOTE — Discharge Summary (Signed)
Summary: Hospital Discharge Update    Hospital Discharge Update:  Date of Admission: 04/06/2010 Date of Discharge: 04/11/2010  Brief Summary:  Pt was admitted with similar complaints of N/V and diffuse severe abdominla pain of unknown origin. During this admission too, no cause was found of her pain.  She has about 80 pounds wt loss in last 18 months per the charts and also Biopsy of Duodenum done in November did show Hpylori and ? Sprue. So Celiac disease was considered as an possibility, but Anti TTG Ig A and Ig G came back negative.  So finally , most likely cause of her N/V is gastroparesis- eventhough Gastric emptying study was normal- it was done on 01/28/10 when she was already on Metoclopramide since 01/26/10- which affects the results( might be false negative). So will likely repeat gastric Emptying study as an outpatient and consider GI consult if felt necessary  Labs needed at follow-up: Comprehensive metabolic panel  Other follow-up issues:  Please consider having an Outpatient Gastric Emptying Study for Shirley [name] after being off the gastric motility affecting meds for a while or can have an EGG( electrogastrography at [facility]- per Dr. Rogers Seeds recs over phone)  Medication list changes:  Removed medication of TRAMADOL-ACETAMINOPHEN 37.5-325 MG TABS (TRAMADOL-ACETAMINOPHEN) Take 1 tablet by mouth once a day as needed for pain. Added new medication of KETOROLAC TROMETHAMINE 10 MG TABS (KETOROLAC TROMETHAMINE) Please take 1 tablet every 6 hrs as needed for pain - Signed Rx of KETOROLAC TROMETHAMINE 10 MG TABS (KETOROLAC TROMETHAMINE) Please take 1 tablet every 6 hrs as needed for pain;  #90 x 3;  Signed;  Entered by: Lyn Hollingshead;  Authorized by: Lyn Hollingshead;  Method used: [facility] to [facility] [phone]*, [address] [place], [place]  [address], Ph: [phone] or [phone], Fax: [phone] Rx of ONDANSETRON HCL 4 MG TABS (ONDANSETRON HCL) Take 1 tab every 4 hours  as needed for nausea.;  #30 x 0;  Signed;  Entered by: Lyn Hollingshead;  Authorized by: Lyn Hollingshead;  Method used: [facility] to [facility]. [phone]*, [address] [place], [place]  [address], Ph: [phone] or [phone], Fax: [phone] Rx of PROMETHAZINE HCL 12.5 MG TABS (PROMETHAZINE HCL) Take one tablet by mouth every 6 hours as needed for nausea and vomiting;  #60 x 0;  Signed;  Entered by: Lyn Hollingshead;  Authorized by: Lyn Hollingshead;  Method used: [facility] to [facility]. [phone]*, [address] [place], [place]  [address], Ph: [phone] or [phone], Fax: [phone] Rx of HYOSCYAMINE SULFATE 0.125 MG TABS (HYOSCYAMINE SULFATE) Take one tablet by mouth every 4 hours as needed for cramping;  #150 x 0;  Signed;  Entered by: Lyn Hollingshead;  Authorized by: Lyn Hollingshead;  Method used: [facility] to [facility]. [phone]*, [address], [place], [place]  [address], Ph: [phone] or [phone], Fax: [phone]  Discharge medications:  [facility] ASPIRIN EC 325 MG  TBEC (ASPIRIN) Take 1 tablet by mouth once a day LANTUS SOLOSTAR 100 UNIT/ML SOLN (INSULIN GLARGINE) Inject 15 units subcutaneously once a day until your next appointment.     PRODIGY TWIST TOP LANCETS 28G  MISC (LANCETS) use to test blood sugar 4x daily     PRODIGY AUTOCODE BLOOD GLUCOSE  STRP (GLUCOSE BLOOD) use to test blood sugars 6x a day before and after meals     PEN NEEDLES 31G X 8 MM MISC (INSULIN PEN NEEDLE) use to inject insulin  4 times a day FERROUS GLUCONATE 324 (38 FE) MG TABS (FERROUS GLUCONATE) Take one tablet by mouth two times a day on an empty stomach or if not possible take with water. NEURONTIN 300 MG CAPS (GABAPENTIN) Take 1 tablet by mouth three times a day METOPROLOL TARTRATE 25 MG TABS (METOPROLOL TARTRATE) Take 1 tablet by mouth two times a day ONDANSETRON HCL 4 MG TABS (ONDANSETRON HCL) Take 1 tab every 4 hours as needed for nausea. PROMETHAZINE HCL 12.5 MG TABS (PROMETHAZINE HCL) Take one tablet by mouth  every 6 hours as needed for nausea and vomiting HYOSCYAMINE SULFATE 0.125 MG TABS (HYOSCYAMINE SULFATE) Take one tablet by mouth every 4 hours as needed for cramping OMEPRAZOLE 40 MG CPDR (OMEPRAZOLE) Take one tablet by mouth two times a day KETOROLAC TROMETHAMINE 10 MG TABS (KETOROLAC TROMETHAMINE) Please take 1 tablet every 6 hrs as needed for pain  Other patient instructions:  Please f/u with your appointment on 05/02/2010 at [facility] at 10.15 am.  Please take all your meds regularly and also call the clinc or come to ED if your condition worsens.  Note: Hospital Discharge Medications & Other Instructions handout was printed, one copy for patient and a second copy to be placed in hospital chart.    Complete Medication List: 1)  [facility] Aspirin Ec 325 Mg Tbec (Aspirin) .... Take 1 tablet by mouth once a day 2)  Lantus Solostar 100 Unit/ml Soln (Insulin glargine) .... Inject 15 units subcutaneously once a day until your next appointment. 3)  Prodigy Twist Top Lancets 28g Misc (Lancets) .... Use to test blood sugar 4x daily 4)  Prodigy Autocode Blood Glucose Strp (Glucose blood) .... Use to test blood sugars 6x a day before and after meals 5)  Pen Needles 31g X 8 Mm Misc (Insulin pen needle) .... Use to inject insulin 4 times a day 6)  Ferrous Gluconate 324 (38 Fe) Mg Tabs (Ferrous gluconate) .... Take one tablet by mouth two times a day on an empty stomach or if not possible take with water. 7)  Neurontin 300 Mg Caps (Gabapentin) .... Take 1 tablet by mouth three times a day 8)  Metoprolol Tartrate 25 Mg Tabs (Metoprolol tartrate) .... Take 1 tablet by mouth two times a day 9)  Ondansetron Hcl 4 Mg Tabs (Ondansetron hcl) .... Take 1 tab every 4 hours as needed for nausea. 10)  Promethazine Hcl 12.5 Mg Tabs (Promethazine hcl) .... Take one tablet by mouth every 6 hours as needed for nausea and vomiting 11)  Hyoscyamine Sulfate 0.125 Mg Tabs (Hyoscyamine sulfate) .... Take one  tablet by mouth every 4 hours as needed for cramping 12)  Omeprazole 40 Mg Cpdr (Omeprazole) .... Take one tablet by mouth two times a day 13)  Ketorolac Tromethamine 10 Mg Tabs (Ketorolac tromethamine) .... Please take 1 tablet every 6 hrs as needed for pain  Prescriptions: HYOSCYAMINE SULFATE 0.125 MG TABS (HYOSCYAMINE SULFATE) Take one tablet by mouth every 4 hours as needed for cramping  #150 x 0   Entered and Authorized by:   Lyn Hollingshead   Signed by:   Lyn Hollingshead on 04/11/2010   Method used:   [facility] to        [facility] [phone]* (retail)       [address]       [place], [place]  [address]       Ph: [phone] or [phone]       Fax:  [phone]   RxID:   [id number] PROMETHAZINE HCL 12.5 MG TABS (PROMETHAZINE HCL) Take one tablet by mouth every 6 hours as needed for nausea and vomiting  #60 x 0   Entered and Authorized by:   Lyn Hollingshead   Signed by:   Lyn Hollingshead on 04/11/2010   Method used:   [facility] to        [facility] [phone]* (retail)       [address], [place]  [address]       Ph: [phone] or [phone]       Fax: [phone]   RxID:   [id number] ONDANSETRON HCL 4 MG TABS (ONDANSETRON HCL) Take 1 tab every 4 hours as needed for nausea.  #30 x 0   Entered and Authorized by:   Lyn Hollingshead   Signed by:   Lyn Hollingshead on 04/11/2010   Method used:   [facility] to        [facility] [phone]* (retail)       [address], [place]  [address]       Ph: [phone] or [phone]       Fax: [phone]   RxID:   [id number] KETOROLAC TROMETHAMINE 10 MG TABS (KETOROLAC TROMETHAMINE) Please take 1 tablet every 6 hrs as needed for pain  #90 x 3   Entered and Authorized by:   Lyn Hollingshead   Signed by:   Lyn Hollingshead on 04/11/2010   Method used:   [facility]        [facility] [phone]* (retail)       [address]       [place], [place]  [address]       Ph: [phone] or [phone]       Fax:  [phone]   RxID:   [id number]

## 2010-05-26 NOTE — Procedures (Signed)
Summary: Upper Endoscopy  Patient: Shirley [name] Note: All result statuses are Final unless otherwise noted.  Tests: (1) Upper Endoscopy (EGD)   EGD Upper Endoscopy       DONE     [facility]     [address]     [place], [place]  [address]           ENDOSCOPY PROCEDURE REPORT           PATIENT:  [name], [name]  MR#:  [id number]     BIRTHDATE:  [birth year], 34 yrs. old  GENDER:  female     ENDOSCOPIST:  Judie Petit T. Russella Dar, MD, [facility]     Referred by:  Lina Sayre, M.D.     PROCEDURE DATE:  03/02/2010     PROCEDURE:  EGD with biopsy, [address]     ASA CLASS:  Class II     INDICATIONS:  nausea and vomiting, abdominal pain, left upper     quadratn, microcytic anemia     MEDICATIONS:   Benadryl 25 mg IV, Fentanyl 75 mg IV, Versed 6 mg     IV     TOPICAL ANESTHETIC:  Cetacaine Spray     DESCRIPTION OF PROCEDURE:   After the risks benefits and     alternatives of the procedure were thoroughly explained, informed     consent was obtained.  The EG-[id number] ([id number]) endoscope was     introduced through the mouth and advanced to the second portion of     the duodenum, without limitations.  The instrument was slowly     withdrawn as the mucosa was fully examined.     <<PROCEDUREIMAGES>>     Very mild gastritis was found in the body and fundus of the     stomach. It was erythematous and granular. Biopsies of the antrum,     body and fundus of the stomach were obtained and sent to     pathology. The esophagus and gastroesophageal junction were     completely normal in appearance. The duodenal bulb was normal in     appearance, as was the postbulbar duodenum. Random biopsies were     obtained and sent to pathology.  Retroflexed views revealed no     abnormalities.  The scope was then withdrawn from the patient and     the procedure completed.           COMPLICATIONS:  None           ENDOSCOPIC IMPRESSION:     1) Mild gastritis           RECOMMENDATIONS:     1) Await  pathology results     2) NO CAUSE FOR PATIENTS SYMPTOMS UNCOVERED           Judie Petit T. Russella Dar, MD, [facility]           n.     eSIGNED:   Venita Lick. Stark at 03/02/2010 11:31 AM           [place], [place], [phone]  Note: An exclamation mark (!) indicates a result that was not dispersed into the flowsheet. Document Creation Date: 03/02/2010 11:32 AM _______________________________________________________________________  (1) Order result status: Final Collection or observation date-time: 03/02/2010 11:24 Requested date-time:  Receipt date-time:  Reported date-time:  Referring Physician:   Ordering Physician: Claudette Head [phone]) Specimen Source:  Source: Launa Grill Order Number: [id number] Lab site:

## 2010-05-26 NOTE — Letter (Signed)
Summary: [facility] - [facility] ORDER  [facility] ORDER   Imported By: Shon Hough 11/29/2009 16:46:43  _____________________________________________________________________  External Attachment:    Type:   Image     Comment:   External Document

## 2010-05-26 NOTE — Assessment & Plan Note (Signed)
Summary: f/u ED, diab complications/pcp-vega/hla   Vital Signs:  Patient profile:   35 year old female Height:      67 inches (170.18 cm) Weight:      233.04 pounds (105.93 kg) BMI:     36.63 Temp:     99.1 degrees F (37.28 degrees C) oral Pulse rate:   105 / minute BP sitting:   128 / 89  (right arm)  Vitals Entered By: Angelina Ok RN (February 21, 2010 4:15 PM) CC: Depression Is Patient Diabetic? Yes Did you bring your meter with you today? No Pain Assessment Patient in pain? yes     Location: STOMACH Intensity: 10 Type: aching, SHARP Onset of pain  Constant CBG Result 162  Have you ever been in a relationship where you felt threatened, hurt or afraid?No   Does patient need assistance? Functional Status Self care Ambulation Normal Comments Abdominal pain.  Vomiting green mucous and blood tinged.  Unable to eat or keep anything down.  Diarrhea started yesterday. IV started with 22g1In IW to left hand - excellent blood return, site WNL, dressing dry and intact - iv inserted at 5:25 P , flushed easily with NS - at 5:30 P 1000 cc NS started at 100cc/hr per written order, Dr. Cena Benton. Dorie Rank RN  February 21, 2010 5:38 PM    Primary Care [facility]inke:  Laren Everts MD  CC:  Depression.  History of Present Illness: 35 yr old woman with pmhx as described below comes to the clinic for HFU. Patient describes:  Abdominal Pain localized to left quadrant. When she throws up pain radiates to epigastric area.  Vomiting 7 times today. Started around 6am to 2pm. Greenish color with specks of blood. For the last month.  Diarrhea started yesterday morning. 3 times yesterday and 2 today. Associated with chills. Denies fevers.  Patient also has some vaginal drainage which is of yellowish color. clumps. Started last night.   Depression History:      The patient denies a depressed mood most of the day and a diminished interest in her usual daily activities.          Preventive Screening-Counseling & Management  Alcohol-Tobacco     Smoking Status: quit     Year Quit: quit 17 yrs ago     Passive Smoke Exposure: no  Problems Prior to Update: 1)  Inadequate Material Resources  (ICD-V60.2) 2)  Peripheral Neuropathy  (ICD-356.9) 3)  Hyperlipidemia  (ICD-272.4) 4)  Polyneuropathy, Diabetic  (ICD-357.2) 5)  Screening For Lipoid Disorders  (ICD-V77.91) 6)  Boils, Recurrent  (ICD-680.9) 7)  Yeast Infection  (ICD-112.9) 8)  Depression  (ICD-311) 9)  Abdominal Pain Right Lower Quadrant  (ICD-789.03) 10)  Fibromyalgia  (ICD-729.1) 11)  Back Pain  (ICD-724.5) 12)  Obstructive Sleep Apnea  (ICD-327.23) 13)  Diabetes Mellitus, Type II  (ICD-250.00) 14)  Chest Pain  (ICD-786.50) 15)  Hypertension  (ICD-401.9) 16)  Coronary Artery Disease, S/p Ptca  (ICD-414.9) 17)  Microcytic Anemia  (ICD-281.9) 18)  Asthma  (ICD-493.90) 19)  Migraine Headache  (ICD-346.90) 20)  Pedal Edema  (ICD-782.3) 21)  Irregular Menstruation  (ICD-626.4) 22)  Hepatitis B, Hx of  (ICD-V12.09) 23)  Anxiety Depression  (ICD-300.4) 24)  Obesity, Morbid  (ICD-278.01)  Medications Prior to Update: 1)  Cvs Aspirin Ec 325 Mg  Tbec (Aspirin) .... Take 1 Tablet By Mouth Once A Day 2)  Lantus Solostar 100 Unit/ml  Soln (Insulin Glargine) .... Inject 85 Units Subcutaneously Once A Day Until  Seen By Your Primary Care Doctor. 3)  Prodigy Twist Top Lancets 28g  Misc (Lancets) .... Use To Test Blood Sugar 4x Daily 4)  Prodigy Autocode Blood Glucose  Strp (Glucose Blood) .... Use To Test Blood Sugars 6x A Day Before and After Meals 5)  Pen Needles 31g X 8 Mm Misc (Insulin Pen Needle) .... Use To Inject Insulin 4 Times A Day 6)  Lyrica 75 Mg  Caps (Pregabalin) .... Take 1 Capsule By Mouth Twice A Day 7)  Ventolin Hfa 108 (90 Base) Mcg/act  Aers (Albuterol Sulfate) .... Inhale 1-2 Puffs Every 4 -6 Hours As Needed 8)  Ferrous Sulfate 325 (65 Fe) Mg  Tbec (Ferrous Sulfate) .... Take 1 Tablet By  Mouth Three Times A Day 9)  Neurontin 300 Mg Caps (Gabapentin) .... Take 1 Tablet By Mouth Three Times A Day 10)  Novolog Penfill 100 Unit/ml Soln (Insulin Aspart) .... 50 Units Three Times Daily 11)  Flexeril 10 Mg Tabs (Cyclobenzaprine Hcl) .... Take 1 Tab By Mouth At Bedtime 12)  Diclofenac Potassium 50 Mg Tabs (Diclofenac Potassium) .... Take 1 Tab Twice Daily 13)  Ultram Er 100 Mg Xr24h-Tab (Tramadol Hcl) .... Take 1 Tablet By Mouth Once A Day 14)  Metoprolol Tartrate 25 Mg Tabs (Metoprolol Tartrate) .... Take 1 Tablet By Mouth Two Times A Day 15)  Ondansetron Hcl 4 Mg Tabs (Ondansetron Hcl) .... Take 1 Tab Every 4 Hours As Needed For Nausea. 16)  Promethazine Hcl 12.5 Mg Tabs (Promethazine Hcl) .... Take 1 Tab Every 4 Hours As Needed For Nausea and Vomiting  Current Medications (verified): 1)  Cvs Aspirin Ec 325 Mg  Tbec (Aspirin) .... Take 1 Tablet By Mouth Once A Day 2)  Lantus Solostar 100 Unit/ml  Soln (Insulin Glargine) .... Inject 85 Units Subcutaneously Once A Day Until Seen By Your Primary Care Doctor. 3)  Prodigy Twist Top Lancets 28g  Misc (Lancets) .... Use To Test Blood Sugar 4x Daily 4)  Prodigy Autocode Blood Glucose  Strp (Glucose Blood) .... Use To Test Blood Sugars 6x A Day Before and After Meals 5)  Pen Needles 31g X 8 Mm Misc (Insulin Pen Needle) .... Use To Inject Insulin 4 Times A Day 6)  Lyrica 75 Mg  Caps (Pregabalin) .... Take 1 Capsule By Mouth Twice A Day 7)  Ventolin Hfa 108 (90 Base) Mcg/act  Aers (Albuterol Sulfate) .... Inhale 1-2 Puffs Every 4 -6 Hours As Needed 8)  Ferrous Sulfate 325 (65 Fe) Mg  Tbec (Ferrous Sulfate) .... Take 1 Tablet By Mouth Three Times A Day 9)  Neurontin 300 Mg Caps (Gabapentin) .... Take 1 Tablet By Mouth Three Times A Day 10)  Novolog Penfill 100 Unit/ml Soln (Insulin Aspart) .... 50 Units Three Times Daily 11)  Flexeril 10 Mg Tabs (Cyclobenzaprine Hcl) .... Take 1 Tab By Mouth At Bedtime 12)  Diclofenac Potassium 50 Mg Tabs  (Diclofenac Potassium) .... Take 1 Tab Twice Daily 13)  Ultram Er 100 Mg Xr24h-Tab (Tramadol Hcl) .... Take 1 Tablet By Mouth Once A Day 14)  Metoprolol Tartrate 25 Mg Tabs (Metoprolol Tartrate) .... Take 1 Tablet By Mouth Two Times A Day 15)  Ondansetron Hcl 4 Mg Tabs (Ondansetron Hcl) .... Take 1 Tab Every 4 Hours As Needed For Nausea. 16)  Promethazine Hcl 12.5 Mg Tabs (Promethazine Hcl) .... Take 1 Tab Every 4 Hours As Needed For Nausea and Vomiting  Allergies: 1)  ! Lisinopril  Past History:  Past Medical History: Last  updated: 01/26/2010 CAD- s/p Subendocardial MI with PDA angioplasty(no stent) on 06/15/06 and relook cath 06/19/06 showed patency of site. Cath 12/10- no restenosis or significant CAD progression Microcytic Anemia with heterozygous Hemoglobin C-Baseline Hgb 8 Asthma:since age 18years Migraines on prophylaxis Diabetes mellitus, type II Uncontrolled:Dx 9/06- HbA1c- 13.8, 12/07/09 Hypertension Hepatitis B, hx of: Hep BeAb+,Hep B cAb+ & Hep BsAg+ (9/06) Morbid obesity H/o Irregular peroids:Referred to [facility]                                  Small ovarian follicles seen on CT(9/06) H/o GrpB Pyelonephritis (9/06) and UTI- 07/11- E.Coli, 12/10- GBS Depression Vaginal Abscess- 10/09- Abundant S. 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  Risk Factors: Exercise: yes (01/07/2010)  Risk Factors: Smoking Status: quit (02/21/2010) Passive Smoke Exposure: no (02/21/2010)  Family History: Reviewed history from 09/30/2009 and no changes required. dm:  father  Social History: Reviewed history from 04/01/2007 and no changes required. 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  Also works at  [employer] having to lift elderly individuals.  Review of Systems  The patient denies fever, chest pain, dyspnea on exertion, melena, hematochezia, and hematuria.    Physical Exam  General:  distress due to abdominal pain  Mouth:  dry mucous membranes Lungs:  normal respiratory effort, normal breath sounds, no crackles, and no wheezes.   Heart:  normal rate, regular rhythm, no murmur, no gallop, no rub, and no JVD.   Abdomen:  ttp left quadrant, diminished bowel sounds, no rebound tenderness, no masses, no guarding, no rigidity Extremities:  no edema  Neurologic:  Nonfocal   Impression & Recommendations:  Problem # 1:  ABDOMINAL PAIN (ICD-789.00) Associated with intractable N/V. Patient not able to tolerate anything by mouth. Will admit. Differential broad but concerning of PID with complains of yellowish clumped vaginal drainage. Differential also includes Gastroenteris, Drug withdrawal, Pancreatitis. Further management per inpatient team.  Complete Medication List: 1)  Cvs Aspirin Ec 325 Mg Tbec (Aspirin) .... Take 1 tablet by mouth once a day 2)  Lantus Solostar 100 Unit/ml Soln (Insulin glargine) .... Inject 85 units subcutaneously once a day until seen by your primary care doctor. 3)  Prodigy Twist Top Lancets 28g Misc (Lancets) .... Use to test blood sugar 4x daily 4)  Prodigy Autocode Blood Glucose Strp (Glucose blood) .... Use to test blood sugars 6x a day before and after meals 5)  Pen Needles 31g X 8 Mm Misc (Insulin pen needle) .... Use to inject insulin 4 times a day 6)  Lyrica 75 Mg Caps (Pregabalin) .... Take 1 capsule by mouth twice a day 7)  Ventolin Hfa 108 (90 Base) Mcg/act Aers (Albuterol sulfate) .... Inhale 1-2 puffs every 4 -6 hours as needed 8)  Ferrous Sulfate 325 (65 Fe) Mg Tbec (Ferrous sulfate) .... Take 1 tablet by mouth three times a day 9)  Neurontin 300 Mg Caps (Gabapentin) .... Take 1 tablet by mouth three times a day 10)  Novolog Penfill  100 Unit/ml Soln (Insulin aspart) .... 50 units three times daily 11)  Flexeril 10 Mg Tabs (Cyclobenzaprine hcl) .... Take 1 tab by mouth at  bedtime 12)  Diclofenac Potassium 50 Mg Tabs (Diclofenac potassium) .... Take 1 tab twice daily 13)  Ultram Er 100 Mg Xr24h-tab (Tramadol hcl) .... Take 1 tablet by mouth once a day 14)  Metoprolol Tartrate 25 Mg Tabs (Metoprolol tartrate) .... Take 1 tablet by mouth two times a day 15)  Ondansetron Hcl 4 Mg Tabs (Ondansetron hcl) .... Take 1 tab every 4 hours as needed for nausea. 16)  Promethazine Hcl 12.5 Mg Tabs (Promethazine hcl) .... Take 1 tab every 4 hours as needed for nausea and vomiting  Other Orders: Capillary Blood Glucose/CBG (65784)   Orders Added: 1)  Capillary Blood Glucose/CBG [69629]    Prevention & Chronic Care Immunizations   Influenza vaccine: Not documented   Influenza vaccine deferral: Not indicated  (12/07/2009)    Tetanus booster: Not documented   Td booster deferral: Not indicated  (12/07/2009)    Pneumococcal vaccine: Not documented  Other Screening   Pap smear:  Specimen Adequacy: Satisfactory for evaluation.   Interpretation/Result:Negative for intraepithelial Lesion or Malignancy.   Interpretation/Result:Fungal organisms c/w Candida present.      (08/09/2007)   Pap smear action/deferral: Deferred-3 yr interval  (12/07/2009)   Pap smear due: 08/2008   Smoking status: quit  (02/21/2010)  Diabetes Mellitus   HgbA1C: 13.8  (12/07/2009)    Eye exam: Not documented   Diabetic eye exam action/deferral: Ophthalmology referral  (08/30/2009)    Foot exam: yes  (01/07/2010)   Foot exam action/deferral: Do today   High risk foot: No  (08/12/2008)   Foot care education: Not documented    Urine microalbumin/creatinine ratio: 141.8  (08/30/2009)   Urine microalbumin action/deferral: Ordered  Lipids   Total Cholesterol: 194  (05/07/2009)   Lipid panel action/deferral: Lipid Panel ordered   LDL: 107   (05/07/2009)   LDL Direct: Not documented   HDL: 35  (05/07/2009)   Triglycerides: 262  (05/07/2009)    SGOT (AST): 28  (05/11/2008)   BMP action: Not indicated   SGPT (ALT): 10  (05/11/2008)   Alkaline phosphatase: 72  (05/11/2008)   Total bilirubin: 0.5  (05/11/2008)  Hypertension   Last Blood Pressure: 128 / 89  (02/21/2010)   Serum creatinine: 0.86  (10/30/2008)   Serum potassium 4.5  (10/30/2008)  Self-Management Support :   Personal Goals (by the next clinic visit) :     Personal A1C goal: 10  (05/07/2009)     Personal blood pressure goal: 130/80  (05/07/2009)     Personal LDL goal: 100  (05/07/2009)    Patient will work on the following items until the next clinic visit to reach self-care goals:     Medications and monitoring: take my medicines every day, check my blood sugar, bring all of my medications to every visit, examine my feet every day  (02/21/2010)     Eating: drink diet soda or water instead of juice or soda, eat more vegetables, use fresh or frozen vegetables, eat foods that are low in salt, eat baked foods instead of fried foods, eat fruit for snacks and desserts, limit or avoid alcohol  (02/21/2010)     Activity: take a 30 minute walk every day  (02/21/2010)    Diabetes self-management support: Written self-care plan, Education handout, Pre-printed educational material, Resources for patients handout  (02/21/2010)   Diabetes care plan printed   Diabetes education handout printed   Last diabetes self-management training by diabetes educator: 08/12/2008    Hypertension self-management support: Written self-care plan, Education handout,  Pre-printed educational material, Resources for patients handout  (02/21/2010)   Hypertension self-care plan printed.   Hypertension education handout printed    Lipid self-management support: Written self-care plan, Education handout, Pre-printed educational material, Resources for patients handout  (02/21/2010)   Lipid  self-care plan printed.   Lipid education handout printed      Resource handout printed.    Vital Signs:  Patient profile:   35 year old female Height:      67 inches (170.18 cm) Weight:      233.04 pounds (105.93 kg) BMI:     36.63 Temp:     99.1 degrees F (37.28 degrees C) oral Pulse rate:   105 / minute BP sitting:   128 / 89  (right arm)  Vitals Entered By: Angelina Ok RN (February 21, 2010 4:15 PM)  Appended Document: f/u ED, diab complications/pcp-vega/hla vaginal exam performed by Dr. Denton Meek in clinic - pt taken to floor in stable condition via w/c - IV infusing - report called to [facility] on 3000 - pt taken to room 3015. Dorie Rank, RN, 02/21/2010, 5:50 P

## 2010-05-26 NOTE — Progress Notes (Signed)
Summary: med change/gp  Phone Note Outgoing Call   Summary of Call: Prior Authorization required for Avapro 300mg .  Pt. needs to try an ace inhibitor which she is allergic to Lisinopril.  But she still need to try or fail  both Cozaar and Diovan.  Can you change med. to Diovan or Cozaar unless thee's a contraindication.  Thanks Initial call taken by: Chinita Pester RN,  June 24, 2009 11:07 AM    New/Updated Medications: COZAAR 100 MG TABS (LOSARTAN POTASSIUM) Take 1 tablet by mouth once a day Prescriptions: COZAAR 100 MG TABS (LOSARTAN POTASSIUM) Take 1 tablet by mouth once a day  #30 x 3   Entered and Authorized by:   Laren Everts MD   Signed by:   Laren Everts MD on 06/30/2009   Method used:   Electronically to        [facility]  [address] [phone]* (retail)       [address]       [place], [place]  [id number]       Ph: [phone] or [phone]       Fax: [phone]   RxID:   [id number]

## 2010-05-26 NOTE — Miscellaneous (Signed)
Summary: Hospital Admission  INTERNAL MEDICINE ADMISSION HISTORY AND PHYSICAL  PCP: Dr. Cena Benton  1st contact Dr Cathey Endow [phone]   2nd contact Dr Baltazar Apo  [phone]  Holidays or 5pm on weekdays:  1st contact [phone] 2nd contact [phone]  CC: N/V, Abd pain.  HPI: Pt is a 35 yo woman with PMH of uncontrolled DM 2, NSTEMI, HTN, cames to ED with c/o N/V for 3 weeks and abd pain since 1 week.  She started havong nausea 3 weeks from now- fells nauseated all day.  She vomits about 2-3 times a day since last 3 weeks,  especially after meals and so is not able to keep anything down except ginger ale and some crackles. Her last solid meal was a week before. She vomitted 5 times today and 2 times in ambulance. She came to the ED yesterday, 01/24/10 10pm, got an abd U/S-normal except gall bladder wall polyp-0.5cm, U/A and UCx which showed infection and so was send home with Bactrim, but she was not able to keep any down due to vomitting. Denies any fever,cough,chest pain,SOB,diarrhea, dysuria, urinary frequency, anorexia, wt loss, headache.   ALLERGIES: ! LISINOPRIL   PAST MEDICAL HISTORY: CAD- s/p Subendocardial MI with PDA angioplasty(no stent) on 06/15/06 and relook cath 06/19/06 showed patency of site. 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 aureus- sensitive to all abx   MEDICATIONS: [facility] ASPIRIN EC 325 MG  TBEC (ASPIRIN) Take 1 tablet by mouth once a day LANTUS SOLOSTAR 100 UNIT/ML   SOLN (INSULIN GLARGINE) Inject 85 units subcutaneously two times a day     PRODIGY TWIST TOP LANCETS 28G  MISC (LANCETS) use to test blood sugar 4x daily     PRODIGY AUTOCODE BLOOD GLUCOSE  STRP (GLUCOSE BLOOD) use to test blood sugars 6x a day before and after meals     PEN NEEDLES 31G X 8 MM MISC (INSULIN PEN NEEDLE) use to inject insulin 4 times a day LYRICA 75 MG  CAPS (PREGABALIN) Take 1 capsule by mouth twice a day VENTOLIN HFA 108 (90 BASE) MCG/ACT  AERS (ALBUTEROL SULFATE) Inhale 1-2 puffs every 4 -6 hours as needed FERROUS SULFATE 325 (65 FE) MG  TBEC (FERROUS SULFATE) Take 1 tablet by mouth three times a day NEURONTIN 300 MG CAPS (GABAPENTIN) Take 1 tablet by mouth three times a day NOVOLOG PENFILL 100 UNIT/ML SOLN (INSULIN ASPART) 50 units three times daily FLEXERIL 10 MG TABS (CYCLOBENZAPRINE HCL) Take 1 tab by mouth at bedtime DICLOFENAC POTASSIUM 50 MG TABS (DICLOFENAC POTASSIUM) Take 1 tab twice daily ULTRAM ER 100 MG XR24H-TAB (TRAMADOL HCL) Take 1 tablet by mouth once a day METOPROLOL TARTRATE 25 MG TABS (METOPROLOL TARTRATE) Take 1 tablet by mouth two times a day   SOCIAL HISTORY: 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   FAMILY HISTORY  dm:  father   ROS: As per HPI, all other systems reviewed and negative  VITALS:  T:99.5 F     P: 94       BP: 128/76     R: 16      O2SAT: 100%  ON: RA  PHYSICAL EXAM: Gen: Patient is in NAD, Pleasant. Eyes: PERRL, EOMI, No signs of anemia or jaundince. ENT: Dry mucous membranes, OP clear, No erythema, thrush or exudates. Neck: Supple, No carotid Bruits, No JVD, No thyromegaly Resp: CTA- Bilaterally, No W/C/R. CVS: S1S2 RRR, No M/R/G GI: Abdomen is soft. ND, Tender to p[alpation on Epigastric and Hypogastric area, NG, NR, BS+. No organomegaly. GU: no Urinary abn, no dysuria, urgency, frequency Extr: no edema, cyanosis,  rash, clubbing Neuro: A&Ox3, CN II-XII intact, power and sensation normal in all 4 extr., gait normal. Psych: Appropriate  LABS:   Lipase                                   27                11-59            U/L   CBC:   WBC                                      16.9       h      4.0-10.5         K/uL  RBC                                      5.13       h      3.87-5.11        MIL/uL  Hemoglobin (HGB)                         11.5       l      12.0-15.0        g/dL  Hematocrit (HCT)                         34.2       l      36.0-46.0        %  MCV                                      66.7       l      78.0-100.0       fL  MCH -                                    22.4       l      26.0-34.0        pg  MCHC                                     33.6  30.0-36.0        g/dL  RDW                                      17.9       h      11.5-15.5        %  Platelet Count (PLT)                     498        h      150-400          K/uL  Neutrophils, %                           78         h      43-77            %  Lymphocytes, %                           16                12-46            %  Monocytes, %                             6                 3-12             %  Eosinophils, %                           0                 0-5              %  Basophils, %                             0                 0-1              %  Neutrophils, Absolute                    13.2       h      1.7-7.7          K/uL  Lymphocytes, Absolute                    2.7               0.7-4.0          K/uL  Monocytes, Absolute                      1.0               0.1-1.0          K/uL  Eosinophils, Absolute                    0.0  0.0-0.7          K/uL  Basophils, Absolute                      0.0               0.0-0.1          K/uL  RBC Morphology                           SEE NOTE.    TARGET CELLS  CMET:   Sodium (NA)                              131        l      135-145          mEq/L   Potassium (K)                            3.8               3.5-5.1          mEq/L  Chloride                                 100               96-112           mEq/L  CO2                                      25                19-32            mEq/L  Glucose                                  220        h      70-99            mg/dL  BUN                                      7                 6-23             mg/dL  Creatinine                               0.70              0.4-1.2          mg/dL  GFR, Est Non African American            >60               >60              mL/min  GFR, Est African American                >60               >  60              mL/min    Oversized comment, see footnote  1  Bilirubin, Total                         0.5               0.3-1.2          mg/dL  Alkaline Phosphatase                     67                39-117           U/L  SGOT (AST)                               14                0-37             U/L  SGPT (ALT)                               <8                0-35             U/L    REPEATED TO VERIFY  Total  Protein                           7.9               6.0-8.3          g/dL  Albumin-Blood                            3.3        l      3.5-5.2          g/dL  Calcium                                  9.1               8.4-10.5         mg/dL   U/A:   Color, Urine                             YELLOW            YELLOW  Appearance                               CLOUDY     a      CLEAR  Specific Gravity                         1.023             1.005-1.030  pH  7.0               5.0-8.0  Urine Glucose                            >1000      a      NEG              mg/dL  Bilirubin                                NEGATIVE          NEG  Ketones                                  15         a      NEG              mg/dL  Blood                                    NEGATIVE          NEG  Protein                                   NEGATIVE          NEG              mg/dL  Urobilinogen                             2.0        h      0.0-1.0          mg/dL  Nitrite                                  NEGATIVE          NEG  Leukocytes                               SMALL      a      NEG\   U Micro:  Squamous Epithelial / LPF                MANY       a      RARE  WBC / HPF                                11-20             <3               WBC/hpf  RBC / HPF                                0-2               <3  RBC/hpf  Bacteria / HPF                           FEW        a      RARE    Pregnancy, Urine-POC                     NEGATIVE   ASSESSMENT AND PLAN  ABDOMINAL PAIN/N/V: - Unclear etiology currently. - Differentials include Gastroparesis vs Gastroenteritis vs Gastritis vs PUD  Plan - Admit to Regular bed - Full liquid diet as tolerated - Conservative management with IV Morphine, IV Phenergan, IV Protonix - Gastric emptying study to R/O Gastroparesis - Start Metoclopropamide empirically pending Gastric emptying study - Clean catch U/A and Microscopy - AXR to r/o SBO - Check 12 lead ECG   DM-II: - Very very poorly controlled - Lantus 50 units QHS for now + SSI - CBG's QAC and QHS - Needs closer outpatient follow up and compliance  HYPERTENSION: - Well controlled - Continue her home meds  LEUCOCYTOSIS: - Chronic leucocytosis - No obvious source of infection - Will check U/A and Microscopy - Will get AXR  ANEMIA: - Chronic - Hgb stable from her baseline - Monitor H&H  CAD: - Suspect no active issues - Continue her home regimen of B-blocker, ASA, plavix  VTE PPX: - Lovenox    VTE Proph: Lovenox

## 2010-05-26 NOTE — Progress Notes (Signed)
Summary: med refill/gp  Phone Note Refill Request Message from:  Fax from Pharmacy on Aug 25, 2009 2:38 PM  Refills Requested: Medication #1:  VICODIN 5-500 MG  TABS Take 1 tablet by mouth every 12 hours as needed for pain   Last Refilled: 07/26/2009  Method Requested: Telephone to Pharmacy Initial call taken by: Chinita Pester RN,  Aug 25, 2009 2:39 PM    Prescriptions: VICODIN 5-500 MG  TABS (HYDROCODONE-ACETAMINOPHEN) Take 1 tablet by mouth every 12 hours as needed for pain  #60 x 0   Entered and Authorized by:   Laren Everts MD   Signed by:   Laren Everts MD on 08/25/2009   Method used:   Telephoned to ...       [facility] [address] [phone]* (retail)       [address]       [place], [place]  [address]       Ph: [phone] or [phone]       Fax: [phone]   RxID:   [id number]   Appended Document: med refill/gp Rx has been refilled 08/24/09.

## 2010-05-26 NOTE — Progress Notes (Signed)
Summary: phone/gg  Phone Note Call from Patient   Caller: Patient Summary of Call: Pt was d/c from  hospital  on 10/9 for nausea and vomiting, she   returned to ED Tuesday with vomiting.  Today she calls with c/o feeling  weak, dizzy  and having abd pain. Today vomited x3, last night x5. NOt drinking or eating.  Pt is tearful.  Pt lives alone with kids. Pt # [id number] Initial call taken by: Merrie Roof RN,  February 03, 2010 3:11 PM  Follow-up for Phone Call        Reviewed records Nl cath (but h/o PTCA) 12/10 [place] 10/11 [place] Gastric empting study nl 10/11 CT ABD pelvix 10/11 Cocaine + Most recently 9/11 UA + 10/11  Will get POC UDS, UA, and orthostatics.  Follow-up by: Blanch Media MD,  February 03, 2010 3:31 PM  Additional Follow-up for Phone Call Additional follow up Details #1::        As of 4:30, pt has not shown up for her 4 PM appt. Inocencio Homes stressed that pt had to be here before 4 so that we had time to properly evaluate her. If pt shows up,she can be referred to [facility] or ER.  Additional Follow-up by: Blanch Media MD,  February 03, 2010 4:30 PM

## 2010-05-26 NOTE — Progress Notes (Signed)
  Phone Note Outgoing Call   Summary of Call: Phone not working.  Letter mailed to patient to pick up $40 [employer] gift card for children's school supplies.      Appended Document:  I was unable to find fin. assistance for this patient.  Called [address] [employer] who had same list of crisis resources that we do.

## 2010-05-26 NOTE — Miscellaneous (Signed)
Summary: Orders Update  Clinical Lists Changes  Problems: Added new problem of INADEQUATE MATERIAL RESOURCES (ICD-V60.2) Orders: Added new Referral order of Social Work Referral (Social ) - Signed 

## 2010-05-26 NOTE — Progress Notes (Signed)
Summary: refill/ hla  Phone Note Refill Request Message from:  Patient on Aug 24, 2009 10:45 AM  Refills Requested: Medication #1:  VICODIN 5-500 MG  TABS Take 1 tablet by mouth every 12 hours as needed for pain   Last Refilled: 4/1 Initial call taken by: Marin Roberts RN,  Aug 24, 2009 10:45 AM    Prescriptions: VICODIN 5-500 MG  TABS (HYDROCODONE-ACETAMINOPHEN) Take 1 tablet by mouth every 12 hours as needed for pain  #60 x 0   Entered and Authorized by:   Laren Everts MD   Signed by:   Laren Everts MD on 08/24/2009   Method used:   Telephoned to ...       [facility] [facility] [address] [phone]* (retail)       [address]       [place], [place]  [address]       Ph: [phone] or [phone]       Fax: [phone]   RxID:   [id number]   Appended Document: refill/ [facility]

## 2010-05-26 NOTE — Assessment & Plan Note (Signed)
Summary: NEW ENDO/VEGA PCP/MEDICAID/DIABETES,EVAL INSULIN PUMP/CD   Vital Signs:  Patient profile:   35 year old female Height:      67 inches Weight:      244 pounds BMI:     38.35 O2 Sat:      97 % on Room air Temp:     98.5 degrees F oral Pulse rate:   85 / minute BP sitting:   116 / 78  (left arm) Cuff size:   large  Vitals Entered By: Margaret Pyle, CMA (September 30, 2009 1:58 PM)  O2 Flow:  Room air CC: New Endo- Insulin Pump Eval, Pt has multiple medicine changes Is Patient Diabetic? Yes Did you bring your meter with you today? No   Primary Provider:  Laren Everts MD  CC:  New Endo- Insulin Pump Eval and Pt has multiple medicine changes.  History of Present Illness: pt states 8 years h/o dm.  it is complicated by cad.  she has been on insulin x a few years.  she takes lantus and novolog.   no cbg record, but states cbg's are always 300-400, with no trend throughout the day. pt says her diet is "good," and exercise is poor.   symptomatically, pt states 1 month of severe pain at both legs, and associated numbness.   Allergies (verified): 1)  ! Lisinopril  Family History: Reviewed history and no changes required. dm:  father  Social History: Reviewed history from 04/01/2007 and no changes required. 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  Review of Systems       The patient complains of headaches.         denies blurry vision, chest pain, sob, n/v, cramps, excessive diaphoresis, memory loss, depression, hypoglycemia, bruising, and rhinorrhea. she has 212 lbs x 2 years.   she has polyuria  Physical Exam  General:  morbidly obese.  no distress  Head:  head: no deformity eyes: no periorbital swelling, no proptosis external nose and ears are normal mouth: no lesion seen Neck:  Supple without thyroid enlargement or tenderness.    Lungs:  Clear to auscultation bilaterally. Normal respiratory effort.  Heart:  Regular rate and rhythm without murmurs or gallops noted. Normal S1,S2.   Abdomen:  abdomen is soft, nontender.  no hepatosplenomegaly.   not distended.  no hernia there is redundant skin Msk:  muscle bulk and strength are grossly normal.  no obvious joint swelling.  gait is normal and steady  Pulses:  dorsalis pedis intact bilat.  no carotid bruit  Extremities:  no deformity.  no ulcer on the feet.  feet are of normal color and temp.  no edema  Neurologic:  cn 2-12 grossly intact.   readily moves all 4's.   sensation is intact to touch on the feet  Skin:  normal texture and temp.  no rash.  not diaphoretic  Cervical Nodes:  No significant adenopathy.  Psych:  Alert and cooperative; normal mood and affect; normal attention span and concentration.     Impression & Recommendations:  Problem # 1:  DIABETES MELLITUS, TYPE II (ICD-250.00) needs increased rx  Problem # 2:  POLYNEUROPATHY, DIABETIC (ICD-357.2) Assessment: Unchanged  Problem # 3:  DEPRESSION (ICD-311) this complicates the rx of #1  Medications Added to Medication List This Visit: 1)  Lantus Solostar 100 Unit/ml Soln (Insulin glargine) .... Inject 85 units subcutaneously two  times a day 2)  Novolog Flexpen 100 Unit/ml Soln (Insulin aspart) .... Inject 75 units before meal three times a day  Other Orders: New Patient Level IV ([address])  Patient Instructions: 1)  good diet and exercise habits significanly improve the control of your diabetes.  please let me know if you wish to be referred to a dietician.  high blood sugar is very risky to your health.  you should see an eye doctor every year. 2)  controlling your blood pressure and cholesterol drastically reduces the damage diabetes does to your body.  this also applies to quitting smoking.  please discuss these with your doctor.  you should take an aspirin every day, unless you have been  advised by a doctor not to. 3)  we will need to take this complex situation in stages 4)  check your blood sugar 3 times a day.  vary the time of day when you check, between before the 3 meals, and at bedtime.  also check if you have symptoms of your blood sugar being too high or too low.  please keep a record of the readings and bring it to your next appointment here.  please call us sooner if you are having low blood sugar episodes. 5)  for now, continue same lantus, and increase novolog to 75 units three times a day (just before each meal). 6)  Please schedule a follow-up appointment in 1-2 weeks.

## 2010-05-26 NOTE — Miscellaneous (Signed)
  Clinical Lists Changes  Medications: Rx of AMOXICILLIN 500 MG CAPS (AMOXICILLIN) Please take 1 capsule two times a day by mouth;  #24 x 0;  Signed;  Entered by: Lyn Hollingshead;  Authorized by: Lyn Hollingshead;  Method used: [facility] to [facility] [phone]*, [address], [place], [place]  [address], Ph: [phone] or [phone], Fax: [phone]  Pt needs total 24 doses. Miscalculated Amoxycillin for the first prescription- so sent it again.   Prescriptions: AMOXICILLIN 500 MG CAPS (AMOXICILLIN) Please take 1 capsule two times a day by mouth  #24 x 0   Entered and Authorized by:   Lyn Hollingshead   Signed by:   Lyn Hollingshead on 04/11/2010   Method used:   Electronically to        [facility] [phone]* (retail)       [address]       [place], [place]  [address]       Ph: [phone] or [phone]       Fax: [phone]   RxID:   [id number]

## 2010-05-26 NOTE — Progress Notes (Signed)
  Phone Note Outgoing Call   Call placed by: Theotis Barrio NT II,  November 03, 2009 3:22 PM Call placed to: Specialist Details for Reason: DR KERR/ ENDOCRINOLOGY [phone] Summary of Call: CALLED AND SPOKE WITH MARY TO FOLLOW UP ON REFERRAL, MS Lebron WAS TO CALL OFFICE AND SPEAK WITH ANGIE / ACCOUNT HAS BEEN BLOCKED. MARY SAYS ACCORDING TO RECORDS, MS Oatis DID CALL, BUT NEVER FOLLOW UP. HER ACCOUNT IS STILL BLOCKED.Shila Kruczek NT II  November 03, 2009 3:25 PM

## 2010-05-26 NOTE — Progress Notes (Signed)
Summary: med refill/gp  Phone Note Refill Request Message from:  Fax from Pharmacy  Refills Requested: Medication #1:  PEN NEEDLES 31G X 8 MM MISC use to inject insulin 4 times a day  Method Requested: Electronic Initial call taken by: Chinita Pester RN,  December 03, 2009 11:29 AM    Prescriptions: PEN NEEDLES 31G X 8 MM MISC (INSULIN PEN NEEDLE) use to inject insulin 4 times a day  #120 x 11   Entered and Authorized by:   Laren Everts MD   Signed by:   Laren Everts MD on 12/04/2009   Method used:   Electronically to        [facility]  [address] [phone]* (retail)       [address]       [place], [place]  [id number]       Ph: [phone] or [phone]       Fax: [phone]   RxID:   [id number]

## 2010-05-26 NOTE — Progress Notes (Signed)
Summary: refill/gg  Phone Note Refill Request  on November 29, 2009 12:26 PM  Pt called and wants refill on vicodin.  I see it's off her med list since hospital discharge. She was  positive for cocaine on admission.  Are we stopping all narcotics? please advise.  Initial call taken by: Merrie Roof RN,  November 29, 2009 12:28 PM    Yes. Patient has broken her Narcotic contract.    Appended Document: refill/gg Pt informed she will not be getting any narcotics from the clinic and why.

## 2010-05-26 NOTE — Progress Notes (Signed)
Summary: refill/hla  Phone Note Refill Request Message from:  Patient on June 22, 2009 2:46 PM  Refills Requested: Medication #1:  VICODIN 5-500 MG  TABS Take 1 tablet by mouth every 12 hours as needed for pain   Last Refilled: 2/1 Initial call taken by: Marin Roberts RN,  June 22, 2009 2:46 PM    Prescriptions: VICODIN 5-500 MG  TABS (HYDROCODONE-ACETAMINOPHEN) Take 1 tablet by mouth every 12 hours as needed for pain  #60 x 0   Entered and Authorized by:   Laren Everts MD   Signed by:   Laren Everts MD on 06/23/2009   Method used:   Telephoned to ...       [facility]  [address] [phone]* (retail)       [address]       [place], [place]  [address]       Ph: [phone] or [phone]       Fax: [phone]   RxID:   [id number]   Appended Document: refill/hla Rx called in

## 2010-05-26 NOTE — Letter (Signed)
Summary: MEDICAID MEDICATION  MEDICAID MEDICATION   Imported By: Margie Billet 07/07/2009 12:08:34  _____________________________________________________________________  External Attachment:    Type:   Image     Comment:   External Document

## 2010-05-26 NOTE — Progress Notes (Signed)
  Phone Note Outgoing Call   Call placed by: Theotis Barrio NT II,  November 03, 2009 3:27 PM Call placed to: Patient Details for Reason: FOLLOW UP ON ENDOCRINOLOGY Summary of Call: CALLED AND LEFT VOICE MESSAGE FOR MS Brazier TO CALL, SO WE CAN FOLLOW UP ON THE REFERRAL

## 2010-05-26 NOTE — Assessment & Plan Note (Signed)
Summary: legs/feet tingling, hard to amb x 2 weeks/pcp-vega/hla   Vital Signs:  Patient profile:   35 year old female Height:      67 inches (170.18 cm) Weight:      245.0 pounds (111.36 kg) BMI:     38.51 Temp:     98.6 degrees F (37.00 degrees C) oral Pulse rate:   107 / minute BP sitting:   128 / 92  (right arm)  Vitals Entered By: Stanton Kidney Ditzler RN (October 01, 2009 11:22 AM) Is Patient Diabetic? Yes Did you bring your meter with you today? No Pain Assessment Patient in pain? yes     Location: abdomen Intensity: 10 Type: cramps Onset of pain  pd time Nutritional Status BMI of 25 - 29 = overweight Nutritional Status Detail appetite ok  Have you ever been in a relationship where you felt threatened, hurt or afraid?denies   Does patient need assistance? Functional Status Self care Ambulation Normal Comments Past month - heavy and sharp feeling in legs and numbness in feet. CBG are up.   Primary Care Provider:  Laren Everts MD   History of Present Illness: Shirley [name] comes today for followings:   1. Leg and feet pain: There is pain in both legs. There is numbness/tingling and feels heavy. She may have some leg swelling. She ran out of her lyrica for 3 days. She is taking her neurontin.   2. DM: She did not bring her meter today. She takes lantus 85 two times a day and novolog 55 three times a day. She checks her CBG 3 times a day and her CBG runs like 3-500. She saw an endocrinologist yesterday. He increased her novolog from 40 to 89. She will f/u with him in a month.   Depression History:      The patient denies a depressed mood most of the day and a diminished interest in her usual daily activities.         Preventive Screening-Counseling & Management  Alcohol-Tobacco     Smoking Status: quit     Year Quit: quit 17 yrs ago     Passive Smoke Exposure: no  Caffeine-Diet-Exercise     Does Patient Exercise: yes     Type of exercise: WALKING  Times/week: 7  Allergies: 1)  ! Lisinopril  Review of Systems      See HPI  Physical Exam  Mouth:  pharynx pink and moist.   Lungs:  normal breath sounds, no crackles, and no wheezes.   Heart:  normal rate, regular rhythm, no murmur, and no gallop.   Pulses:  R posterior tibial normal, R dorsalis pedis normal, L posterior tibial normal, and L dorsalis pedis normal.   Extremities:  trace left pedal edema and trace right pedal edema.   Light touch sensation and pinprick sensation intact and symmetrical on both legs.  Neurologic:  alert & oriented X3.     Impression & Recommendations:  Problem # 1:  DIABETES MELLITUS, TYPE II (ICD-250.00) She recently saw an endorinologist and further care for DM will be per his recs.  Her updated medication list for this problem includes:    [facility] Aspirin Ec 325 Mg Tbec (Aspirin) .Marland Kitchen... Take 1 tablet by mouth once a day    Cozaar 100 Mg Tabs (Losartan potassium) .Marland Kitchen... Take 1 tablet by mouth once a day    Lantus Solostar 100 Unit/ml Soln (Insulin glargine) ..... Inject 85 units subcutaneously two times a day    Novolog Flexpen  100 Unit/ml Soln (Insulin aspart) ..... Inject 55 units before meal three times a day  Labs Reviewed: Creat: 0.86 (10/30/2008)    Reviewed HgBA1c results: >14.0 (08/30/2009)  >14.0 (05/07/2009)  Problem # 2:  PERIPHERAL NEUROPATHY (ICD-356.9) She is taking once a day of neurontin and I will increase this to three times a day and f/u in a month. SHe also takes lyrica for fibromyalgia which should have some effect on peripheral neuropathy.   Complete Medication List: 1)  [facility]vs Aspirin Ec 325 Mg Tbec (Aspirin) .... Take 1 tablet by mouth once a day 2)  Plavix 75 Mg Tabs (Clopidogrel bisulfate) .... Take 1 tablet by mouth once a day 3)  Metoprolol Succinate 50 Mg Xr24h-tab (Metoprolol succinate) .... Take 1 tablet by mouth two times a day 4)  Imdur 30 Mg Tb24 (Isosorbide mononitrate) .... Take 1 tablet by mouth once a day 5)   Cozaar 100 Mg Tabs (Losartan potassium) .... Take 1 tablet by mouth once a day 6)  Lantus Solostar 100 Unit/ml Soln (Insulin glargine) .... Inject 85 units subcutaneously two times a day 7)  Advair Diskus 100-50 Mcg/dose Misc (Fluticasone-salmeterol) .... Inhale one puff two times a day 8)  Singulair 10 Mg Tabs (Montelukast sodium) .... Take 1 tablet by mouth once a day 9)  Novolog Flexpen 100 Unit/ml Soln (Insulin aspart) .... Inject 55 units before meal three times a day 10)  Protonix 40 Mg Tbec (Pantoprazole sodium) .... Take 1 tablet by mouth once a day 11)  Relpax 20 Mg Tabs (Eletriptan hydrobromide) .... Take one pill by mouth if you feel a migraine attack coming on. you may repeat in 2 hours if pain persists. max 80mg /24hrs 12)  Simvastatin 10 Mg Tabs (Simvastatin) .... Take 1 tablet by mouth once a day at bedtime 13)  Xopenex 0.63 Mg/52ml Nebu (Levalbuterol hcl) .... Nebs as needed every 3 hourly. 14)  Atrovent 0.03 % Soln (Ipratropium bromide) .... Nebs every 3 hourly as needed. 15)  Lyrica 75 Mg Caps (Pregabalin) .... Take 1 capsule by mouth twice a day 16)  Ventolin Hfa 108 (90 Base) Mcg/act Aers (Albuterol sulfate) .... Inhale 1-2 puffs every 4 -6 hours as needed 17)  Ferrous Sulfate 325 (65 Fe) Mg Tbec (Ferrous sulfate) .... Take 1 tablet by mouth three times a day 18)  Vicodin 5-500 Mg Tabs (Hydrocodone-acetaminophen) .... Take 1 tablet by mouth every 12 hours as needed for pain 19)  Prodigy Autocode Blood Glucose Strp (Glucose blood) .... Use to test blood sugars 6x a day before and after meals 20)  Prodigy Twist Top Lancets 28g Misc (Lancets) .... Use to test blood sugar 4x daily 21)  Pen Needles 31g X 8 Mm Misc (Insulin pen needle) .... Use to inject insulin 4 times a day 22)  Neurontin 300 Mg Caps (Gabapentin) .... Take 1 tablet by mouth three times a day 23)  Ambien 5 Mg Tabs (Zolpidem tartrate) .... Take 1 tablet by mouth at bedtime  Patient Instructions: 1)  Please schedule a  follow-up appointment in 4 weeks. 2)  Limit your Sodium (Salt) to less than 2 grams a day(slightly less than 1/2 a teaspoon) to prevent fluid retention, swelling, or worsening of symptoms. 3)  It is important that you exercise regularly at least 20 minutes 5 times a week. If you develop chest pain, have severe difficulty breathing, or feel very tired , stop exercising immediately and seek medical attention. 4)  You need to lose weight. Consider a lower calorie  diet and regular exercise.  5)  It is not healthy  for men to drink more than 2-3 drinks per day or for women to drink more than 1-2 drinks per day. 6)  Check your blood sugars regularly. If your readings are usually above : or below 70 you should contact our office. 7)  It is important that your Diabetic A1c level is checked every 3 months. 8)  See your eye doctor yearly to check for diabetic eye damage. 9)  Check your feet each night for sore areas, calluses or signs of infection. 10)  Check your Blood Pressure regularly. If it is above: you should make an appointment. Prescriptions: LYRICA 75 MG  CAPS (PREGABALIN) Take 1 capsule by mouth twice a day  #60 x 1   Entered and Authorized by:   Jason Coop MD   Signed by:   Jason Coop MD on 10/01/2009   Method used:   Print then Give to Patient   RxID:   [id number]    Prevention & Chronic Care Immunizations   Influenza vaccine: Not documented    Tetanus booster: Not documented    Pneumococcal vaccine: Not documented  Other Screening   Pap smear:  Specimen Adequacy: Satisfactory for evaluation.   Interpretation/Result:Negative for intraepithelial Lesion or Malignancy.   Interpretation/Result:Fungal organisms c/w Candida present.      (08/09/2007)   Pap smear action/deferral: GYN Referral  (08/30/2009)   Pap smear due: 08/2008   Smoking status: quit  (10/01/2009)  Diabetes Mellitus   HgbA1C: >14.0  (08/30/2009)    Eye exam: Not documented   Diabetic eye  exam action/deferral: Ophthalmology referral  (08/30/2009)    Foot exam: yes  (08/12/2008)   High risk foot: No  (08/12/2008)   Foot care education: Not documented    Urine microalbumin/creatinine ratio: 141.8  (08/30/2009)   Urine microalbumin action/deferral: Ordered  Lipids   Total Cholesterol: 194  (05/07/2009)   Lipid panel action/deferral: Lipid Panel ordered   LDL: 107  (05/07/2009)   LDL Direct: Not documented   HDL: 35  (05/07/2009)   Triglycerides: 262  (05/07/2009)    SGOT (AST): 28  (05/11/2008)   SGPT (ALT): 10  (05/11/2008)   Alkaline phosphatase: 72  (05/11/2008)   Total bilirubin: 0.5  (05/11/2008)  Hypertension   Last Blood Pressure: 128 / 92  (10/01/2009)   Serum creatinine: 0.86  (10/30/2008)   Serum potassium 4.5  (10/30/2008)  Self-Management Support :   Personal Goals (by the next clinic visit) :     Personal A1C goal: 10  (05/07/2009)     Personal blood pressure goal: 130/80  (05/07/2009)     Personal LDL goal: 100  (05/07/2009)    Patient will work on the following items until the next clinic visit to reach self-care goals:     Medications and monitoring: take my medicines every day, check my blood sugar  (10/01/2009)     Eating: drink diet soda or water instead of juice or soda, eat more vegetables, use fresh or frozen vegetables, eat fruit for snacks and desserts, limit or avoid alcohol  (10/01/2009)     Activity: take a 30 minute walk every day  (10/01/2009)    Diabetes self-management support: Written self-care plan, Education handout  (10/01/2009)   Diabetes care plan printed   Diabetes education handout printed   Last diabetes self-management training by diabetes educator: 08/12/2008    Hypertension self-management support: Written self-care plan, Education handout  (10/01/2009)   Hypertension self-care  plan printed.   Hypertension education handout printed    Lipid self-management support: Written self-care plan, Education handout   (10/01/2009)   Lipid self-care plan printed.   Lipid education handout printed

## 2010-05-26 NOTE — Progress Notes (Signed)
Summary: prior authorization-Pantoprazole/gp  Phone Note Outgoing Call   Summary of Call: Pantoprazole 40mg  needs a Prior Authorization. Pt. needs to try 2 of the preferred  meds.first.  Preferred meds. are Nexium capsules, Omeprazole, Omeprazole OTC, and Prilosec OTC.  Please advise. Thanks   Patietient can use omeprazole 40 mg by mouth once a day. I will send a prescription to the pharmacy.  Initial call taken by: Chinita Pester RN,  March 07, 2010 10:10 AM    New/Updated Medications: OMEPRAZOLE 40 MG CPDR (OMEPRAZOLE) Take one tablet by mouth two times a day Prescriptions: OMEPRAZOLE 40 MG CPDR (OMEPRAZOLE) Take one tablet by mouth two times a day  #30 x 3   Entered and Authorized by:   Almyra Deforest MD   Signed by:   Almyra Deforest MD on 03/10/2010   Method used:   [facility]        [facility] [phone]* (retail)       [address]       [place], [place]  [address]       Ph: [phone] or [phone]       Fax: [phone]   RxID:   [id number]

## 2010-05-26 NOTE — Miscellaneous (Signed)
Summary: FREEDOM DISABILITY / MEDICAL RECORD  FREEDOM DISABILITY / MEDICAL RECORD   Imported By: Margie Billet 09/08/2009 10:25:57  _____________________________________________________________________  External Attachment:    Type:   Image     Comment:   External Document

## 2010-05-26 NOTE — Discharge Summary (Signed)
Summary: Hospital Discharge Update    Hospital Discharge Update:  Date of Admission: 02/21/2010 Date of Discharge: 02/23/2010  Brief Summary:  This is a 35 year old female with PMH significant for DM ( uncontrolled), HTN, Depression who presented with Nausea and Vomiting. 1. Nausea and Vomiting most likely due to gastroenteritis. Resolved during hospital admission.  2. 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 3. Leukocytosis; unclear etiology.Patient was afebrile during hosptial admission. GC/Chlamudia was negative. Patient was treated for candidal vaginitis.  4. Chronic abdominal pain. Multiple work up in the past. Unlcear etiology of her pain.  5. DM --uncontrolled. lIkely due to noncompliance given euglycemia with insulin therapy during hospitalization  ( just needed 10 units of Insulin but patient was NPO and on clear liquid diet during the first 30 hours of admisiion). Patient need simple mangegement for glucose control.  Lab or other results pending at discharge:  Iron studies  Labs needed at follow-up: CBC with differential  Other follow-up issues:  Patient was adviced to check blood glucose level every morning and bring in the reading at the next visit for possible changes in Lantus dosage.  Problem list changes:  Added new problem of SUBSTANCE ABUSE, MULTIPLE (ICD-305.90)  Medication list changes:  Changed medication from LANTUS SOLOSTAR 100 UNIT/ML  SOLN (INSULIN GLARGINE) Inject 85 units subcutaneously once a day until seen by your primary care doctor. to LANTUS SOLOSTAR 100 UNIT/ML SOLN (INSULIN GLARGINE) Inject 10 units subcutaneously once a day until your next appointment. - Signed Removed medication of VENTOLIN HFA 108 (90 BASE) MCG/ACT  AERS (ALBUTEROL SULFATE) Inhale 1-2 puffs every 4 -6 hours as needed Removed medication of NOVOLOG PENFILL 100 UNIT/ML SOLN (INSULIN ASPART) 50 units three times daily Removed medication of DICLOFENAC  POTASSIUM 50 MG TABS (DICLOFENAC POTASSIUM) Take 1 tab twice daily Removed medication of PROMETHAZINE HCL 12.5 MG TABS (PROMETHAZINE HCL) Take 1 tab every 4 hours as needed for nausea and vomiting Removed medication of FLEXERIL 10 MG TABS (CYCLOBENZAPRINE HCL) Take 1 tab by mouth at bedtime Removed medication of LYRICA 75 MG  CAPS (PREGABALIN) Take 1 capsule by mouth twice a day Rx of FERROUS SULFATE 325 (65 FE) MG  TBEC (FERROUS SULFATE) Take 1 tablet by mouth three times a day;  #90 x 0;  Signed;  Entered by: Almyra Deforest MD;  Authorized by: Almyra Deforest MD;  Method used: [facility] [phone]*, [address], [place], [place]  [address], Ph: [phone] or [phone], Fax: [phone] Rx of NEURONTIN 300 MG CAPS (GABAPENTIN) Take 1 tablet by mouth three times a day;  #90 x 0;  Signed;  Entered by: Almyra Deforest MD;  Authorized by: Almyra Deforest MD;  Method used: [facility]. [phone]*, [address] [place], [place]  [address], Ph: [phone] or [phone], Fax: [phone] Rx of ULTRAM ER 100 MG XR24H-TAB (TRAMADOL HCL) Take 1 tablet by mouth once a day;  #31 x 0;  Signed;  Entered by: Almyra Deforest MD;  Authorized by: Almyra Deforest MD;  Method used: [facility] to [facility]. [phone]*, [address] [place], [place]  [address], Ph: [phone] or [phone], Fax: [phone] Rx of METOPROLOL TARTRATE 25 MG TABS (METOPROLOL TARTRATE) Take 1 tablet by mouth two times a day;  #62 x 0;  Signed;  Entered by: Almyra Deforest MD;  Authorized by: Almyra Deforest MD;  Method used: [facility] to [facility] #  [id number]*, [address] [place], [place]  [address], Ph: [phone] or [phone], Fax: [phone] Rx of ONDANSETRON HCL 4 MG TABS (ONDANSETRON HCL) Take 1 tab every 4 hours as needed for nausea.;  #30 x 0;  Signed;  Entered by: Almyra Deforest MD;  Authorized by: Almyra Deforest MD;  Method used: [facility] [phone]*, [address] [place], [place]  [address], Ph: [phone] or [phone], Fax: [phone] Rx of LANTUS SOLOSTAR 100 UNIT/ML SOLN (INSULIN GLARGINE) Inject 10 units subcutaneously once a day until your next appointment.;  #1 box x 1;  Signed;  Entered by: Almyra Deforest MD;  Authorized by: Almyra Deforest MD;  Method used: [facility] to [facility] [phone]*, [address] [place], [place]  [address], Ph: [phone] or [phone], Fax: [phone]  The medication, problem, and allergy lists have been updated.  Please see the dictated discharge summary for details.  Discharge medications:  [facility] ASPIRIN EC 325 MG  TBEC (ASPIRIN) Take 1 tablet by mouth once a day LANTUS SOLOSTAR 100 UNIT/ML SOLN (INSULIN GLARGINE) Inject 10 units subcutaneously once a day until your next appointment.     PRODIGY TWIST TOP LANCETS 28G  MISC (LANCETS) use to test blood sugar 4x daily     PRODIGY AUTOCODE BLOOD GLUCOSE  STRP (GLUCOSE BLOOD) use to test blood sugars 6x a day before and after meals     PEN NEEDLES 31G X 8 MM MISC (INSULIN PEN NEEDLE) use to inject insulin 4 times a day FERROUS SULFATE 325 (65 FE) MG  TBEC (FERROUS SULFATE) Take 1 tablet by mouth three times a day NEURONTIN 300 MG CAPS (GABAPENTIN) Take 1 tablet by mouth three times a day ULTRAM ER 100 MG XR24H-TAB (TRAMADOL HCL) Take 1 tablet by mouth once a day METOPROLOL TARTRATE 25 MG TABS (METOPROLOL TARTRATE) Take 1 tablet by mouth two times a day ONDANSETRON HCL 4 MG TABS (ONDANSETRON HCL) Take 1 tab every 4 hours as needed for nausea.  Other patient instructions:  You have an appointment with Dr Cathey Endow at the [facility] on 03/02/2010 at 1:30 PM.  Please call [phone] if you need to reschedule the appointment.   Note: Hospital Discharge Medications & Other Instructions handout was printed, one copy for patient and a second copy to be placed in hospital chart.  Prescriptions: LANTUS SOLOSTAR 100 UNIT/ML SOLN (INSULIN GLARGINE)  Inject 10 units subcutaneously once a day until your next appointment.  #1 box x 1   Entered and Authorized by:   Almyra Deforest MD   Signed by:   Almyra Deforest MD on 02/23/2010   Method used:   [facility] to        [facility] [place] [facility] [phone]* (retail)       [address], [place]  [address]       Ph: [phone] or [phone]       Fax: [phone]   RxID:   [id number] ONDANSETRON HCL 4 MG TABS (ONDANSETRON HCL) Take 1 tab every 4 hours as needed for nausea.  #30 x 0   Entered and Authorized by:   Almyra Deforest MD   Signed by:   Almyra Deforest MD on 02/23/2010   Method used:   [facility]ronically to        [facility]. [phone]* (retail)       [address] [facility]  [place], [place]  [address]       Ph: [phone] or [phone]       Fax: [phone]   RxID:   [id number] METOPROLOL TARTRATE 25 MG TABS (METOPROLOL TARTRATE) Take 1 tablet by mouth two times a day  #62 x 0   Entered and Authorized by:   Almyra Deforest MD   Signed by:   Almyra Deforest MD on 02/23/2010   Method used:   [facility] to        [facility]. [phone]* (retail)       [address] [place]  [address]       Ph: [phone] or [phone]       Fax: [phone]   RxID:   [id number] ULTRAM ER 100 MG XR24H-TAB (TRAMADOL HCL) Take 1 tablet by mouth once a day  #31 x 0   Entered and Authorized by:   Almyra Deforest MD   Signed by:   Almyra Deforest MD on 02/23/2010   Method used:   [facility]ron[facility] to        [facility]. [phone]* (retail)       [address] [place]  [address]       Ph: [phone] or [phone]       Fax: [phone]   RxID:   [id number] NEURONTIN 300 MG CAPS (GABAPENTIN) Take 1 tablet by mouth three times a day  #90 x 0   Entered and Authorized by:   Almyra Deforest MD   Signed by:   Almyra Deforest MD on 02/23/2010   Method used:   [facility]ronically to        [facility]. [phone]* (retail)       [address], [place]  [address]       Ph: [phone] or [phone]       Fax: [phone]   RxID:   [id number] FERROUS SULFATE 325 (65 FE) MG  TBEC (FERROUS SULFATE) Take 1 tablet by mouth three times a day  #90 x 0   Entered and Authorized by:   Almyra Deforest MD   Signed by:   Almyra Deforest MD on 02/23/2010   Method used:   [facility]ronically to        [facility] [phone]* (retail)       [address]       [place], [place]  [address]       Ph: [phone] or [phone]       Fax: [phone]   RxID:   [id number]

## 2010-05-26 NOTE — Progress Notes (Signed)
Summary: med refill/gp  Phone Note Refill Request Message from:  Fax from Pharmacy on November 16, 2009 12:12 PM  Refills Requested: Medication #1:  PLAVIX 75 MG TABS Take 1 tablet by mouth once a day   Last Refilled: 07/28/2008 Last appt. June 10.   Method Requested: Electronic Initial call taken by: Chinita Pester RN,  November 16, 2009 12:13 PM    Prescriptions: PLAVIX 75 MG TABS (CLOPIDOGREL BISULFATE) Take 1 tablet by mouth once a day  #31 Tablet x 3   Entered and Authorized by:   Laren Everts MD   Signed by:   Laren Everts MD on 11/16/2009   Method used:   [facility]ronically to        [facility]  [address] [phone]* (retail)       [address]       [place], [place]  [address]       Ph: [phone] or [phone]       Fax: [phone]   RxID:   [id number]

## 2010-05-26 NOTE — Progress Notes (Signed)
Summary: prior authorization/gp  Phone Note Outgoing Call   Summary of Call: Prior Authorization for Advair 100/50 Diskus has already been approved from 05/11/09 to 1/172013.  [facility]  [address] [facility]. Initial call taken by: Chinita Pester RN,  May 21, 2009 12:11 PM

## 2010-05-26 NOTE — Discharge Summary (Signed)
Summary: Hospital Discharge Update    Hospital Discharge Update:  Date of Admission: 12/22/2009 Date of Discharge: 11/24/2009  Brief Summary:  Patient was admitted for chest pain and was found positive for cocaine. Acute coronary syndrome was ruled out, cardiac enzymes x 3 were negative, no new EKG changes were noted during this admission. No events on telemetry.  Lab or other results pending at discharge:  No labs pending on discharge.  Other follow-up issues:  Discuss abstinence from cocaine, discuss medical compliance since her medical regimen was simplified. Following meds were removed from her profile: Plavix, metoprolol, Imdur, cozaar, ambien, protonix, replax, vicodin, simvastatin, novolog, atrovent, advair, singulair, xopenex, Please check her cbg's since novolog was removed and readjust the regimen if needed. Also, please check her BP since all of her BP meds have been removed. Also may check FLP to ensure she does not need statin. May check UDS to check for presence of cocaine. Discuss with patient.  Medication list changes:  Removed medication of PLAVIX 75 MG TABS (CLOPIDOGREL BISULFATE) Take 1 tablet by mouth once a day Removed medication of METOPROLOL SUCCINATE 50 MG XR24H-TAB (METOPROLOL SUCCINATE) Take 1 tablet by mouth two times a day Removed medication of IMDUR 30 MG TB24 (ISOSORBIDE MONONITRATE) Take 1 tablet by mouth once a day Removed medication of COZAAR 100 MG TABS (LOSARTAN POTASSIUM) Take 1 tablet by mouth once a day Removed medication of PROTONIX 40 MG  TBEC (PANTOPRAZOLE SODIUM) Take 1 tablet by mouth once a day Removed medication of RELPAX 20 MG  TABS (ELETRIPTAN HYDROBROMIDE) Take one pill by mouth if you feel a migraine attack coming on. You may repeat in 2 hours if pain persists. Max 80mg /24hrs Removed medication of VICODIN 5-500 MG  TABS (HYDROCODONE-ACETAMINOPHEN) Take 1 tablet by mouth every 12 hours as needed for pain Removed medication of AMBIEN 5 MG TABS  (ZOLPIDEM TARTRATE) Take 1 tablet by mouth at bedtime Removed medication of ADVAIR DISKUS 100-50 MCG/DOSE MISC (FLUTICASONE-SALMETEROL) Inhale one puff two times a day Removed medication of SINGULAIR 10 MG TABS (MONTELUKAST SODIUM) Take 1 tablet by mouth once a day Removed medication of NOVOLOG FLEXPEN 100 UNIT/ML  SOLN (INSULIN ASPART) inject 55 units before meal three times a day Removed medication of SIMVASTATIN 10 MG  TABS (SIMVASTATIN) Take 1 tablet by mouth once a day at bedtime Removed medication of XOPENEX 0.63 MG/3ML  NEBU (LEVALBUTEROL HCL) nebs as needed every 3 hourly. Removed medication of ATROVENT 0.03 %  SOLN (IPRATROPIUM BROMIDE) nebs every 3 hourly as needed.  The medication, problem, and allergy lists have been updated.  Please see the dictated discharge summary for details.  Discharge medications:  [facility] ASPIRIN EC 325 MG  TBEC (ASPIRIN) Take 1 tablet by mouth once a day LANTUS SOLOSTAR 100 UNIT/ML  SOLN (INSULIN GLARGINE) Inject 85 units subcutaneously two times a day     PRODIGY TWIST TOP LANCETS 28G  MISC (LANCETS) use to test blood sugar 4x daily     PRODIGY AUTOCODE BLOOD GLUCOSE  STRP (GLUCOSE BLOOD) use to test blood sugars 6x a day before and after meals     PEN NEEDLES 31G X 8 MM MISC (INSULIN PEN NEEDLE) use to inject insulin 4 times a day LYRICA 75 MG  CAPS (PREGABALIN) Take 1 capsule by mouth twice a day VENTOLIN HFA 108 (90 BASE) MCG/ACT  AERS (ALBUTEROL SULFATE) Inhale 1-2 puffs every 4 -6 hours as needed FERROUS SULFATE 325 (65 FE) MG  TBEC (FERROUS SULFATE) Take 1 tablet by  mouth three times a day NEURONTIN 300 MG CAPS (GABAPENTIN) Take 1 tablet by mouth three times a day   Note: Hospital Discharge Medications & Other Instructions handout was printed, one copy for patient and a second copy to be placed in hospital chart.

## 2010-05-26 NOTE — Progress Notes (Signed)
Summary: Prior Authorization approval for Cozaar  Phone Note Outgoing Call   Call placed by: Angelina Ok RN,  July 07, 2009 9:41 AM Call placed to: Insurer Summary of Call: Prior Authorization approved for [place] Potassium 100 mg tablets.  Date 07-07-2009 thru 07-06-2009. Angelina Ok RN  July 07, 2009 9:41 AM  Initial call taken by: Angelina Ok RN,  July 07, 2009 9:41 AM    New/Updated Medications: COZAAR 100 MG TABS (LOSARTAN POTASSIUM) Take 1 tablet by mouth once a day

## 2010-05-26 NOTE — Discharge Summary (Signed)
Summary: Hospital Discharge Update    Hospital Discharge Update:  Date of Admission: 03/01/2010 Date of Discharge: 03/03/2010  Brief Summary:  This is a 35 year old female with PMH significant for DM ( uncontrolled), HTN, Depression who presented with Nausea Vomiting and abdominal pain.  1. Nausea, Vomiting and abdominal pain of unclear ethiology. CT abdomen, ABX, abdominal and pelvis ultrasound, EGD, Chlamydia/GC , UA was all within normal limits. Patient denied Nausea, Vomiting on day of discharge but noted abdominal pain. She was send home with Zofran, Phenergan , Hyoscyamine,  Protonix and Tramadol/Acetaminophen for pain control.  2. Thrombocytosis:most likely due to iron deficiency ( Ferritin was 12 on  04/2009). Periphere smear showed  mild thrombocytosis.  Hem/Onc was consulted who  recommended  to start the patient on Ferrous gluconate. Need to recheck her Iron status with Retic count in 1 month.  3. Leukocytosis: unclear etiology. Iron deficiency can contribute to high WBC. Hepatitis was suggested but Hep B DNA was not quantifiable. LAP was elevated which suggest a reactive process of unclear ethiology. Periphere smear showed absolute neutrophilia with mild a left shift in maturation.Favor a reactive process.  4. DM --uncontrolled. Since patient was NPO during hosptial admission it was difficult to access the need for Insulin. We started her on Lantus 10 units at bedtime and the patient was recommended to check sugars on a regular basis.  There is a need for simplicity in medication regimen since patient has a history of non compliance.    Other follow-up issues:  1. Evaluate symptoms on current medication regimen.  2. Evaluate blood glucose level on current Lantus regimen and make changes accordingly.  Medication list changes:  Changed medication from LANTUS SOLOSTAR 100 UNIT/ML SOLN (INSULIN GLARGINE) Inject 10 units subcutaneously once a day until your next appointment. to LANTUS  SOLOSTAR 100 UNIT/ML SOLN (INSULIN GLARGINE) Inject 15 units subcutaneously once a day until your next appointment. Changed medication from FERROUS SULFATE 325 (65 FE) MG  TBEC (FERROUS SULFATE) Take 1 tablet by mouth three times a day to FERROUS GLUCONATE 324 (38 FE) MG TABS (FERROUS GLUCONATE) Take one tablet by mouth two times a day on an empty stomach or if not possible take with water. - Signed Added new medication of PROMETHAZINE HCL 12.5 MG TABS (PROMETHAZINE HCL) Take one tablet by mouth every 6 hours as needed for nausea and vomiting - Signed Added new medication of HYOSCYAMINE SULFATE 0.125 MG TABS (HYOSCYAMINE SULFATE) Take one tablet by mouth every 4 hours as needed for cramping - Signed Added new medication of PANTOPRAZOLE SODIUM 40 MG TBEC (PANTOPRAZOLE SODIUM) Take one tablet by mouth two times a day - Signed Rx of FERROUS GLUCONATE 324 (38 FE) MG TABS (FERROUS GLUCONATE) Take one tablet by mouth two times a day on an empty stomach or if not possible take with water.;  #60 x 0;  Signed;  Entered by: Almyra Deforest MD;  Authorized by: Almyra Deforest MD;  Method used: [facility]. [phone]*, [address] [place], [place]  [address], Ph: [phone] or [phone], Fax: [phone] Rx of PROMETHAZINE HCL 12.5 MG TABS (PROMETHAZINE HCL) Take one tablet by mouth every 6 hours as needed for nausea and vomiting;  #60 x 0;  Signed;  Entered by: Almyra Deforest MD;  Authorized by: Almyra Deforest MD;  Method used: [facility] to [facility]. [phone]*, [address] [place], [place]  [address], Ph: [phone] or [phone], Fax: [phone] Rx of  HYOSCYAMINE SULFATE 0.125 MG TABS (HYOSCYAMINE SULFATE) Take one tablet by mouth every 4 hours as needed for cramping;  #150 x 0;  Signed;  Entered by: Almyra Deforest MD;  Authorized by: Almyra Deforest MD;  Method used: [facility] to [facility]. [phone]*, [address] [place], [place]  [address], Ph: [phone] or [phone],  Fax: [phone] Rx of ONDANSETRON HCL 4 MG TABS (ONDANSETRON HCL) Take 1 tab every 4 hours as needed for nausea.;  #30 x 0;  Signed;  Entered by: Almyra Deforest MD;  Authorized by: Almyra Deforest MD;  Method used: Electronically to [facility]. [phone]*, [address], [place], [place]  [address], Ph: [phone] or [phone], Fax: [phone] Rx of PANTOPRAZOLE SODIUM 40 MG TBEC (PANTOPRAZOLE SODIUM) Take one tablet by mouth two times a day;  #60 x 0;  Signed;  Entered by: Almyra Deforest MD;  Authorized by: Almyra Deforest MD;  Method used: Electronically to [facility]. [phone]*, [address], [place], [place]  [address], Ph: [phone] or [phone], Fax: [phone]  The medication, problem, and allergy lists have been updated.  Please see the dictated discharge summary for details.  Discharge medications:  [facility] ASPIRIN EC 325 MG  TBEC (ASPIRIN) Take 1 tablet by mouth once a day LANTUS SOLOSTAR 100 UNIT/ML SOLN (INSULIN GLARGINE) Inject 15 units subcutaneously once a day until your next appointment.     PRODIGY TWIST TOP LANCETS 28G  MISC (LANCETS) use to test blood sugar 4x daily     PRODIGY AUTOCODE BLOOD GLUCOSE  STRP (GLUCOSE BLOOD) use to test blood sugars 6x a day before and after meals     PEN NEEDLES 31G X 8 MM MISC (INSULIN PEN NEEDLE) use to inject insulin 4 times a day FERROUS GLUCONATE 324 (38 FE) MG TABS (FERROUS GLUCONATE) Take one tablet by mouth two times a day on an empty stomach or if not possible take with water. NEURONTIN 300 MG CAPS (GABAPENTIN) Take 1 tablet by mouth three times a day TRAMADOL-ACETAMINOPHEN 37.5-325 MG TABS (TRAMADOL-ACETAMINOPHEN) Take 1 tablet by mouth once a day as needed for pain. METOPROLOL TARTRATE 25 MG TABS (METOPROLOL TARTRATE) Take 1 tablet by mouth two times a day ONDANSETRON HCL 4 MG TABS (ONDANSETRON HCL) Take 1 tab every 4 hours as needed for nausea. PROMETHAZINE HCL 12.5 MG TABS (PROMETHAZINE HCL) Take one tablet by mouth every 6 hours  as needed for nausea and vomiting HYOSCYAMINE SULFATE 0.125 MG TABS (HYOSCYAMINE SULFATE) Take one tablet by mouth every 4 hours as needed for cramping PANTOPRAZOLE SODIUM 40 MG TBEC (PANTOPRAZOLE SODIUM) Take one tablet by mouth two times a day  Other patient instructions:  You have an appointment on November 21st at 3 pm with  Dr Cathey Endow at the outpatient clinic [facility]). If you need to reschedule your appointment please call [phone].  Please call  for appointment with Hematology/Omcology Department at the [facility]. Phone number [phone].  If you feel your nausea , vomiting and abdominal pain is not controlled please call the office instead of going directly to the ED. We can try to help you in the office.   Note: Hospital Discharge Medications & Other Instructions handout was printed, one copy for patient and a second copy to be placed in hospital chart.  Prescriptions: PANTOPRAZOLE SODIUM 40 MG TBEC (PANTOPRAZOLE SODIUM) Take one tablet by mouth two times a day  #60 x 0   Entered and Authorized  by:   Almyra Deforest MD   Signed by:   Almyra Deforest MD on 03/03/2010   Method used:   [facility]ronically to        [facility]. [phone]* (retail)       [address] [place]  [address]       Ph: [phone] or [phone]       Fax: [phone]   RxID:   [id number] ONDANSETRON HCL 4 MG TABS (ONDANSETRON HCL) Take 1 tab every 4 hours as needed for nausea.  #30 x 0   Entered and Authorized by:   Almyra Deforest MD   Signed by:   Almyra Deforest MD on 03/03/2010   Method used:   [facility]ronically to        [facility]. [phone]* (retail)       [address] [place]  [address]       Ph: [phone] or [phone]       Fax: [phone]   RxID:   [id number] HYOSCYAMINE SULFATE 0.125 MG TABS (HYOSCYAMINE SULFATE) Take one tablet by mouth every 4 hours as needed for cramping  #150 x 0   Entered and Authorized by:   Almyra Deforest MD   Signed by:   Almyra Deforest MD on 03/03/2010   Method used:   Electronically to        [facility]. [phone]* (retail)       [address], [place]  [address]       Ph: [phone] or [phone]       Fax: [phone]   RxID:   [id number] PROMETHAZINE HCL 12.5 MG TABS (PROMETHAZINE HCL) Take one tablet by mouth every 6 hours as needed for nausea and vomiting  #60 x 0   Entered and Authorized by:   Almyra Deforest MD   Signed by:   Almyra Deforest MD on 03/03/2010   Method used:   [facility]ronically to        [facility] [phone]* (retail)       [address] [place]  [address]       Ph: [phone] or [phone]       Fax: [phone]   RxID:   [id number] FERROUS GLUCONATE 324 (38 FE) MG TABS (FERROUS GLUCONATE) Take one tablet by mouth two times a day on an empty stomach or if not possible take with water.  #60 x 0   Entered and Authorized by:   Almyra Deforest MD   Signed by:   Almyra Deforest MD on 03/03/2010   Method used:   Electronically to        [facility]. [phone]* (retail)       [address]       [place], [place]  [address]       Ph: [phone] or [phone]       Fax: [phone]   RxID:   [id number]

## 2010-05-26 NOTE — Progress Notes (Signed)
Summary: refill/ hla  Phone Note Refill Request Message from:  Patient on January 03, 2010 4:34 PM  Refills Requested: Medication #1:  VICODIN 5-500 MG TABS take 1-2 tablet daily as needed for pain.   Dosage confirmed as above?Dosage Confirmed   Last Refilled: 8/16 Initial call taken by: Marin Roberts RN,  January 03, 2010 4:34 PM    Denied. Violated pain contract. Cocaine positive during hospitalization.

## 2010-05-31 ENCOUNTER — Other Ambulatory Visit: Payer: Self-pay | Admitting: Internal Medicine

## 2010-06-07 ENCOUNTER — Other Ambulatory Visit: Payer: Self-pay | Admitting: Internal Medicine

## 2010-06-07 NOTE — Consult Note (Signed)
Shirley [name], Shirley [name]              ACCOUNT NO.:  [id number]  MEDICAL RECORD NO.:  [id number]          PATIENT TYPE:  INP  LOCATION:  3014                         FACILITY:  [facility]  PHYSICIAN:  Brenan Modesto P. Ewel Lona, M.D.DATE OF BIRTH:  23-Dec-1975  DATE OF CONSULTATION:  03/02/2010 DATE OF DISCHARGE:                                CONSULTATION   Consult requested by internal medicine service for leukocytosis and thrombocytosis in this 35 year old African American lady with multiple other significant medical problems.  History is from the patient, hospital chart and hospital EMR now.  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 She also has iron deficiency by labs in August 2011 and November 2011. She has a history of very heavy menstrual bleeding for years with her cycles, most recent.  Started November 6 and is continuing now.  Per the consult request, the elevated white count and platelets have been noted in clinic information back to 2009.  Present admission is her fourth admission since the end of July, this for nausea, vomiting and abdominal pain of unclear etiology.  Her symptoms have improved since admission, with her last emesis 48 hours ago.  Workup for the GI problems this admission has been negative thus far, including EGD today.  The patient denies fever or clear symptoms of infection prior to admission and no one was ill at home.  Her  only pain now is left lower quadrant abdomen which is improved over admission. Her bowels move today and did not affect the left lower quadrant discomfort.  She has been tolerating clear liquids well and is hungry now.  Per the GI consultant, the nausea and vomiting may be related to the uncontrolled diabetes even without gastroparesis.  Review of systems, otherwise, she has "migraine" headaches several times each months which are not clearly related to her menstrual periods and resolve if she sleeps or goes to the ER, seasonal allergies which are not bothering her presently.  She is up-to-date on dental exams with no known dental problems.  Stable asthma symptoms since childhood.  Recent intentional weight loss with decreased portions.  No arthritis symptoms. No rashes.  No other bleeding and no history of blood clots.  She has been on oral iron perhaps for a year, perhaps twice daily using an over- the-counter preparation type, not known to the patient and all of this history is a Gloria vague.  PAST HISTORY:  As above.  She does not seem aware of hemoglobin C diagnosis.  FAMILY HISTORY:  Positive for diabetes, cardiac disease and lupus in her mother and sister.  Her daughter is anemic.  She is not aware of other hemoglobin C, not  aware of bleeding abnormalities or other hematologic problems.  PHYSICAL EXAMINATION:  GENERAL:  She is an obese lady who looks her stated age, is cooperative and pleasant but not the best historian. VITAL SIGNS:  Blood pressure 111/74, temperature 98.5, heart rate 76 and regular, respirations 20, room air saturation 100%, weight on admission 105 kg, height 67 inches. HEENT:  Pupils are equal and reactive.  She is nonicteric.  Her mucous membranes looks somewhat pale but lighting in this room is very poor tonight.  No obvious adenopathy. LUNGS:  Clear.  Normal saline lock with site not remarkable. ABDOMEN:  Soft, not clearly tender, obese, no appreciable  organomegaly or masses. LOWER EXTREMITIES:  No edema or cords.  She moves easily in bed.  Labs from October 2009, white count 12.9, hemoglobin 11.4, platelets 404.  From January 2010, white count 12.7, hemoglobin 11.8, platelets 385.  From August 2011, reticulocytes 1.6%, iron 30, percent sat 9, B12 418, folate 5.5, and ferritin 23.  From February 22, 2010, iron 114, which may be just after an oral dose, percent sat 31, but ferritin 12. From February 21, 2010, sodium 133, potassium 3.4, chloride 102, CO2 24, glucose 140, BUN 8, creatinine 0.7, total bili 0.7, alk phos 59, GOT 17, GPT 8, total protein 8.1, albumin 3.3, calcium 9.1, hemoglobin A1c 9.3. ANA negative, HIV negative, ESR 47.  From March 01, 2010 white count 14.2, ANC 10, hemoglobin 10.8, MCV 67, platelets 497, segs 17,  lymphs 20, monos, 1.3, eos 0.1, baso 0.  Tech smear reviewed, called some large platelets and a few target cells.  Pathologist review was ordered but not yet in the system.  Hepatitis B antibody negative, hepatitis B surface antigen positive.  Chest x-ray from October 2011, not remarkable.  CT abdomen and pelvis from October 2011 negative including normal liver and spleen size not increased, peripheral smear reviewed now, hypochromic macrocytic red cells very occasional target, no histocyte, white cells mostly segs, no immature cells seen in the platelets or abundant with occasional slightly large but not giant.  IMPRESSION AND RECOMMENDATIONS: 1. Anemia which I think is in significant part due to iron deficiency     from very heavy menstrual bleeding.  She may also have hemoglobin C     though this would probably cause anemia only if she was homozygous.     Would continue good oral iron, consider ferrous fumarate or ferrous     gluconate if she has any GI problems with over-the-counter     ferrous sulfate. She should not use sustained release preparations     as these do not absorb.  She should take iron  about 324 mg b.i.d.     optimally on an empty stomach with orange juice if she can tolerate     that, otherwise on an empty stomach with water but not with food     for best absorption.  If she has very heavy menstrual bleeding, may     need GYN evaluation and recommendations for that.  I would try oral     iron in preference to IV as a start. 2. Thrombocytosis.  This would be expected with iron deficiency and     also could be secondary to chronic inflammation with chronic     hepatitis.  I doubt essential thrombocytosis and I see nothing that     seems suggestive of underlying malignancy or hemolysis.  Would try     to supplement oral iron as above and  consider GYN interventions. 3. Leukocytosis.  It is difficult for me to sort out all the past CBCs     with her other problems, but this could be related to chronic     hepatitis, agree with the LAP score as ordered.  Smear is not     suggestive of CML to me now.  I have discussed with internal medicine resident now.  Please call if further questions or if I can be of more assistance and I will follow up her labs.     Giorgi Debruin P. Darrold Span, M.D.     LPL/MEDQ  D:  03/02/2010  T:  03/03/2010  Job:  [id number]  Electronically Signed by Jama Flavors M.D. on 06/07/2010 02:47:41 PM

## 2010-06-10 NOTE — Discharge Summary (Signed)
Shirley [name], Shirley [name]              ACCOUNT NO.:  [id number]  MEDICAL RECORD NO.:  [id number]          PATIENT TYPE:  INP  LOCATION:  3014                         FACILITY:  [facility]  PHYSICIAN:  Mariea Stable, MD     DATE OF BIRTH:  January 03, 1976  DATE OF ADMISSION:  03/01/2010 DATE OF DISCHARGE:  03/03/2010                              DISCHARGE SUMMARY   DISCHARGE DIAGNOSES: 1. Nausea, vomiting, abdomen pain of unclear etiology. 2. Diabetes mellitus type 2, uncontrolled.  Hemoglobin A1c 9.3 in     September 2011. 3. Hypertension. 4. Depression. 5. History of hepatitis with undetectable viral load. 6. Hepatitis B, history of hep B e antibody positive, hep B c antibody     positive, and hep B s antigen positive in September 2006.  At this     point, hepatitis viral load undetectable. 7. Menorrhagia irregular, small ovarian follicles seen on CT. 8. History of group B pyelonephritis in September 2006 and urinary     tract infection in July 2011 with Escherichia coli and December     2010, group B streptococcus. 9. Depression. 10.Vaginal abscess in October 2009, abundant staph aureus. 11.Polysubstance abuse, history of cocaine.  MEDICATION: 1. Aspirin 325 mg. 2. Lantus 15 units subcu at bedtime. 3. Ferrous gluconate 325 p.o. 2 times a day. 4. Neurontin 300 mg p.o. 3 times a day. 5. Tramadol/Tylenol 37.5/325 mg one tablet once a day as needed. 6. Metoprolol 25 mg p.o. b.i.d. 7. Zofran 4 mg tablets one tablet every 4 hours as needed for nausea. 8. Promethazine 12.5 mg p.o. every 6 hours as needed for nausea. 9. Hyoscyamine sulfate 0.125 mg tablets one tablet p.o. every 4 hours     as needed. 10.Pantoprazole 40 mg p.o. 2 times a day.  DISPOSITION:  The patient will follow up with Dr. Cathey Endow at the [facility] on March 14, 2010, at 3 p.m. Hematology/Oncology, Dr. Darrold Span from Hematology/Oncology noted that there is no further followup needed.  PROCEDURES:   Endoscopy on March 02, 2010, showed mild gastritis which would not cause in the patient's symptoms.  Biopsies were performed. Pathology report is pending.  At this point, transabdominal and transvaginal ultrasound of the pelvis was performed on March 01, 2010, which showed a normal exam.  CONSULTS: 1. Gastroenterology was consulted for the recurrent nausea, vomiting,     abdominal pain and EGD was performed which did not show any     significant pathology. 2. Dr. Ottie Glazier P. Livesay from Hematology/Oncology was consulted for     leukocytosis and thrombocytosis since 2009.  Dr. Darrold Span noted that     thrombocytosis would be expected with iron deficiency and     recommended iron repletion with ferrous gluconate.  Leukocytosis at     this point was most likely more reactive.  The peripheral smear did     not suggest CML.  BRIEF ADMISSION:  This is a 35 year old female with past medical history significant for coronary artery disease, hypertension, hyperlipidemia, multiple recent admissions secondary to abdominal pain, intractable nausea and vomiting who came to the ED, complaining of worsening of symptoms for the  last 4 days and inability to keep anything down.  The patient denied any hemoptysis, blood in her stool, diarrhea, or constipation.  Also denied any shortness of breath or fevers.  PHYSICAL EXAMINATION:  VITAL SIGNS:  On admission, temperature 97.4, pulse 110, blood pressure 130/80, respiratory rate 18, saturation 96% on room air. GENERAL:  Alert, well-developed, in moderate distress on exam. HEAD:  Normocephalic and atraumatic. EYES:  Her vision grossly intact.  Pupils equal, round, and reactive to light.  No injection, icterus. MONTH:  Pharynx pink with mild dry mucous membrane.  No erythema, no exudates. NECK:  Supple, full range of motion.  No thyromegaly, no JVD.  No carotid bruit. LUNGS:  Normal respiratory effort.  No accessory muscle use.  Normal breath sounds.   No crackles, no wheezes. CARDIAC:  Regular rate and rhythm.  No murmurs, gallops, or rubs. ABDOMEN:  Soft.  Positive diffuse tenderness to palpation is there in epigastric area in the left upper quadrant.  Normal bowel sounds, no distention, no guarding, no rebound tenderness.  No hepatosplenomegaly. EXTREMITIES:  No cyanosis, no clubbing, no edema. NEURO:  Alert and oriented x3.  Cranial nerves II through XII intact. Strength is normal in all extremities.  Sensation intact to light touch and gait normal. SKIN:  Turgor normal and no rashes. PSYCH:  Oriented x3.  Memory intact for recent, remote.  Normally interactive.  Good eye contact.  No anxious appearing.  Not depressed appearing.  LABS ON ADMISSION:  Sodium of 138, potassium was 3.6, chloride 104, CO2 of 23, glucose 176, creatinine 0.78, bili total 0.8, alk phos 66, AST 19, ALT less than 8, total protein 8.3, albumin 3.4, calcium 9.3, lipase 32.  WBC 13.2, hemoglobin 12.1, hematocrit 36.3, platelets 556. Absolute neutrophil 9.9.  Pregnancy test negative.  UA did not show any pyuria.  UDS positive for opiates.  HOSPITAL COURSE: 1. Nausea, vomiting, abdomen of unclear etiology.  Extensive workup     did not show any obstructive abnormality suggests the symptoms the     patient presented.  EGD showed mild gastritis which should not     cause any nausea, vomiting, or abdominal pain.  Transabdominal and     vaginal ultrasounds of pelvis was negative.  Abdominal x-ray on     November 31, 2011, showed only nonspecific bowel gas pattern.  CT     abdomen and pelvis without and with contrast on February 18, 2010,     showed lung bases are clear.  No pleural or pericardial effusion.     There are no renal or ureteral stone thickenings appear normal.     The liver gallbladder, spleen, pancreas, and adrenal glands are all     appear normal, small large bowel appendix unremarkable which was     eccentric to the right, but otherwise  unremarkable.  Legs appear     normal.  No lymphadenopathy or fluid.  No focal bony abnormality.     The impression, negative exam.  No clear medicine gastric emptying     scan was performed on January 28, 2010, which showed normal gastric     emptying study.  An ultrasound of the abdomen on January 23, 2010,     showed liver, no focal lesion identified, within normal limits and     parenchymal echogenicity.  IVC appears normal.  Pancreas limited     appearance due to bowel gas.  Spleen measures 11.1 cm, right kidney     measures 12.2 cm without  hydronephrosis, normal in echotexture.     Left kidney normal.  Echotexture measured 12.5 cm length without     hydronephrosis.  Abdomen, normal.  No new finding identified.  No     evidence of gallstones or acute inflammation.  Lipase was normal.     Urinalysis negative by pyuria.  Pregnancy test was negative.     Hepatitis B viral load was undetectable.  HIV was nonreactive.  In     summary, all tests was negative at this point.  There is no     objective abnormalities to suggest the patient's symptoms.  The     patient was discharged with mag citrate including Zofran,     Phenergan, and Ultram for pain control. 2. Uncontrolled diabetes.  A1c 9.3 on February 21, 2010.  She has a     history of noncompliance with insulin medication in the past.  She     was restarted on her Lantus 15 units on a sliding scale in the     hospital as soon as the patient started to eat.  Patient's CBG was     controlled.  However, on discharge, her CBG was 205.  Her dosage     was not adjusted accordingly since the patient was n.p.o. for 2     days.  She has been asked to monitor her CBGs at home and keep a     record of it so as to follow up with her PCP in her next visit for     possible readjustment of her regimen. 3. Anemia, most likely due to iron deficiency.  Considering her     ferritin level of 12 on February 21, 2010 with an iron level of 11.4     on the base  of very heavy menstrual bleeding.  Hematology/Oncology     noted that although the patient may have hemoglobin C (there is no     documentation including electrophoresis suggesting the diagnosis)     could cause anemia except if patient was homozygous.  It was     recommended to start first ferrous gluconate.  If the patient     continues to have heavy menstrual bleeding. The patient may need     gynecology evaluation. 4. Thrombocytosis.  Peripheral smear showed mild thrombocytosis.     Hematology/Oncology noted that this is expected with iron     deficiency, but also could be noted secondary to chronic     inflammation with chronic hepatitis, although hepatitis viral load     was undetectable.  Again, recommended to replete iron and     considering gynecology intervention if persistent heavy menstrual     bleeding and reevaluate. 5. Leukocytosis.  Peripheral smear showed absent neutrophilia with     mild left shift immaturation.  Leukocyte alkaline phosphatase     showed it was elevated suggestive of reactive process.  According     to Hematology/Oncology, the smear does not suggest CML at this     point.  We will recommend to continue to monitor.  VITAL SIGNS AT DISCHARGE:  Temperature 97.9, pulse 87, respiratory rate 20, blood pressure 140/96, saturation 100% on room air.  The patient was discharged in stable condition.  The patient noted no further episodes of nausea and vomiting, but was still complaining about abdominal pain.    ______________________________ Almyra Deforest, MD   ___________________ Mariea Stable, MD     JI/MEDQ  D:  03/06/2010  T:  03/07/2010  Job:  [id number]  cc:   Danne Harbor, MD  Electronically Signed by Almyra Deforest MD on 05/26/2010 06:36:31 PM Electronically Signed by Mariea Stable MD on 06/10/2010 08:56:45 AM

## 2010-06-27 ENCOUNTER — Emergency Department (HOSPITAL_COMMUNITY)
Admission: EM | Admit: 2010-06-27 | Discharge: 2010-06-27 | Disposition: A | Discharge status: Home / Self Care | Payer: Medicaid Other | Attending: Emergency Medicine | Admitting: Emergency Medicine

## 2010-06-27 DIAGNOSIS — I10 Essential (primary) hypertension: Secondary | ICD-10-CM | POA: Insufficient documentation

## 2010-06-27 DIAGNOSIS — I2510 Atherosclerotic heart disease of native coronary artery without angina pectoris: Secondary | ICD-10-CM | POA: Insufficient documentation

## 2010-06-27 DIAGNOSIS — R1012 Left upper quadrant pain: Secondary | ICD-10-CM | POA: Insufficient documentation

## 2010-07-04 LAB — URINALYSIS, ROUTINE W REFLEX MICROSCOPIC
Hgb urine dipstick: NEGATIVE
Ketones, ur: 15 mg/dL — AB
Nitrite: NEGATIVE
Protein, ur: 100 mg/dL — AB
Urobilinogen, UA: 1 mg/dL (ref 0.0–1.0)

## 2010-07-04 LAB — GLUCOSE, CAPILLARY
Glucose-Capillary: 102 mg/dL — ABNORMAL HIGH (ref 70–99)
Glucose-Capillary: 106 mg/dL — ABNORMAL HIGH (ref 70–99)
Glucose-Capillary: 112 mg/dL — ABNORMAL HIGH (ref 70–99)
Glucose-Capillary: 121 mg/dL — ABNORMAL HIGH (ref 70–99)
Glucose-Capillary: 187 mg/dL — ABNORMAL HIGH (ref 70–99)
Glucose-Capillary: 220 mg/dL — ABNORMAL HIGH (ref 70–99)
Glucose-Capillary: 80 mg/dL (ref 70–99)
Glucose-Capillary: 82 mg/dL (ref 70–99)
Glucose-Capillary: 90 mg/dL (ref 70–99)
Glucose-Capillary: 98 mg/dL (ref 70–99)

## 2010-07-04 LAB — COMPREHENSIVE METABOLIC PANEL
AST: 22 U/L (ref 0–37)
Albumin: 3.3 g/dL — ABNORMAL LOW (ref 3.5–5.2)
Alkaline Phosphatase: 50 U/L (ref 39–117)
BUN: 11 mg/dL (ref 6–23)
CO2: 24 mEq/L (ref 19–32)
Chloride: 101 mEq/L (ref 96–112)
Creatinine, Ser: 0.81 mg/dL (ref 0.4–1.2)
GFR calc non Af Amer: >60 mL/min (ref >60)
Potassium: 3.7 mEq/L (ref 3.5–5.1)
Total Bilirubin: 0.8 mg/dL (ref 0.3–1.2)

## 2010-07-04 LAB — CBC
HCT: 28.2 % — ABNORMAL LOW (ref 36.0–46.0)
HCT: 31.1 % — ABNORMAL LOW (ref 36.0–46.0)
Hemoglobin: 10.2 g/dL — ABNORMAL LOW (ref 12.0–15.0)
Hemoglobin: 11.2 g/dL — ABNORMAL LOW (ref 12.0–15.0)
Hemoglobin: 9.3 g/dL — ABNORMAL LOW (ref 12.0–15.0)
MCH: 22.7 pg — ABNORMAL LOW (ref 26.0–34.0)
MCH: 23.2 pg — ABNORMAL LOW (ref 26.0–34.0)
MCHC: 32.8 g/dL (ref 30.0–36.0)
MCV: 68.9 fL — ABNORMAL LOW (ref 78.0–100.0)
MCV: 69.3 fL — ABNORMAL LOW (ref 78.0–100.0)
Platelets: 422 10*3/uL — ABNORMAL HIGH (ref 150–400)
Platelets: 438 10*3/uL — ABNORMAL HIGH (ref 150–400)
Platelets: 506 10*3/uL — ABNORMAL HIGH (ref 150–400)
RBC: 4.09 MIL/uL (ref 3.87–5.11)
RBC: 4.34 MIL/uL (ref 3.87–5.11)
RBC: 4.82 MIL/uL (ref 3.87–5.11)
WBC: 10.6 10*3/uL — ABNORMAL HIGH (ref 4.0–10.5)
WBC: 10.7 10*3/uL — ABNORMAL HIGH (ref 4.0–10.5)
WBC: 13.2 10*3/uL — ABNORMAL HIGH (ref 4.0–10.5)

## 2010-07-04 LAB — BASIC METABOLIC PANEL
BUN: 10 mg/dL (ref 6–23)
CO2: 24 mEq/L (ref 19–32)
CO2: 24 mEq/L (ref 19–32)
CO2: 26 mEq/L (ref 19–32)
Calcium: 8.9 mg/dL (ref 8.4–10.5)
Chloride: 108 mEq/L (ref 96–112)
Chloride: 108 mEq/L (ref 96–112)
Chloride: 109 mEq/L (ref 96–112)
Creatinine, Ser: 0.66 mg/dL (ref 0.4–1.2)
Creatinine, Ser: 0.75 mg/dL (ref 0.4–1.2)
Creatinine, Ser: 0.76 mg/dL (ref 0.4–1.2)
GFR calc Af Amer: >60 mL/min (ref >60)
GFR calc Af Amer: >60 mL/min (ref >60)
GFR calc Af Amer: >60 mL/min (ref >60)
GFR calc non Af Amer: >60 mL/min (ref >60)
GFR calc non Af Amer: >60 mL/min (ref >60)
Glucose, Bld: 70 mg/dL (ref 70–99)
Potassium: 3.1 mEq/L — ABNORMAL LOW (ref 3.5–5.1)
Potassium: 3.8 mEq/L (ref 3.5–5.1)
Sodium: 136 mEq/L (ref 135–145)

## 2010-07-04 LAB — CLOSTRIDIUM DIFFICILE BY PCR: Toxigenic C. Difficile by PCR: NEGATIVE

## 2010-07-04 LAB — URINE MICROSCOPIC-ADD ON

## 2010-07-04 LAB — PHOSPHORUS: Phosphorus: 4 mg/dL (ref 2.3–4.6)

## 2010-07-04 LAB — CK TOTAL AND CKMB (NOT AT ARMC): Total CK: 36 U/L (ref 7–177)

## 2010-07-04 LAB — MAGNESIUM: Magnesium: 1.9 mg/dL (ref 1.5–2.5)

## 2010-07-04 LAB — POCT PREGNANCY, URINE: Preg Test, Ur: NEGATIVE

## 2010-07-05 LAB — COMPREHENSIVE METABOLIC PANEL
AST: 22 U/L (ref 0–37)
Albumin: 3.5 g/dL (ref 3.5–5.2)
Calcium: 10.1 mg/dL (ref 8.4–10.5)
Creatinine, Ser: 0.86 mg/dL (ref 0.4–1.2)
GFR calc Af Amer: >60 mL/min (ref >60)
GFR calc non Af Amer: >60 mL/min (ref >60)

## 2010-07-05 LAB — CBC
HCT: 31.8 % — ABNORMAL LOW (ref 36.0–46.0)
HCT: 36.3 % (ref 36.0–46.0)
HCT: 39 % (ref 36.0–46.0)
Hemoglobin: 10.5 g/dL — ABNORMAL LOW (ref 12.0–15.0)
Hemoglobin: 11.6 g/dL — ABNORMAL LOW (ref 12.0–15.0)
Hemoglobin: 12.1 g/dL (ref 12.0–15.0)
Hemoglobin: 12.9 g/dL (ref 12.0–15.0)
MCH: 22.3 pg — ABNORMAL LOW (ref 26.0–34.0)
MCH: 22.4 pg — ABNORMAL LOW (ref 26.0–34.0)
MCH: 22.6 pg — ABNORMAL LOW (ref 26.0–34.0)
MCH: 22.6 pg — ABNORMAL LOW (ref 26.0–34.0)
MCH: 23.6 pg — ABNORMAL LOW (ref 26.0–34.0)
MCHC: 33 g/dL (ref 30.0–36.0)
MCHC: 33.1 g/dL (ref 30.0–36.0)
MCHC: 33.3 g/dL (ref 30.0–36.0)
MCV: 67.9 fL — ABNORMAL LOW (ref 78.0–100.0)
MCV: 68.1 fL — ABNORMAL LOW (ref 78.0–100.0)
MCV: 71.4 fL — ABNORMAL LOW (ref 78.0–100.0)
Platelets: 453 10*3/uL — ABNORMAL HIGH (ref 150–400)
Platelets: 497 10*3/uL — ABNORMAL HIGH (ref 150–400)
Platelets: 556 10*3/uL — ABNORMAL HIGH (ref 150–400)
Platelets: 560 10*3/uL — ABNORMAL HIGH (ref 150–400)
RBC: 4.78 MIL/uL (ref 3.87–5.11)
RBC: 5.35 MIL/uL — ABNORMAL HIGH (ref 3.87–5.11)
RDW: 19.1 % — ABNORMAL HIGH (ref 11.5–15.5)
RDW: 19.2 % — ABNORMAL HIGH (ref 11.5–15.5)
RDW: 19.2 % — ABNORMAL HIGH (ref 11.5–15.5)
RDW: 21 % — ABNORMAL HIGH (ref 11.5–15.5)
WBC: 13.2 10*3/uL — ABNORMAL HIGH (ref 4.0–10.5)
WBC: 14.2 10*3/uL — ABNORMAL HIGH (ref 4.0–10.5)
WBC: 14.5 10*3/uL — ABNORMAL HIGH (ref 4.0–10.5)

## 2010-07-05 LAB — CK TOTAL AND CKMB (NOT AT ARMC)
CK, MB: 0.6 ng/mL (ref 0.3–4.0)
Total CK: 110 U/L (ref 7–177)
Total CK: 42 U/L (ref 7–177)
Total CK: 48 U/L (ref 7–177)

## 2010-07-05 LAB — RAPID URINE DRUG SCREEN, HOSP PERFORMED
Amphetamines: NOT DETECTED
Amphetamines: NOT DETECTED
Benzodiazepines: NOT DETECTED
Benzodiazepines: NOT DETECTED
Benzodiazepines: NOT DETECTED
Cocaine: NOT DETECTED
Tetrahydrocannabinol: NOT DETECTED

## 2010-07-05 LAB — URINALYSIS, ROUTINE W REFLEX MICROSCOPIC
Bilirubin Urine: NEGATIVE
Glucose, UA: 100 mg/dL — AB
Glucose, UA: 100 mg/dL — AB
Hgb urine dipstick: NEGATIVE
Ketones, ur: 15 mg/dL — AB
Ketones, ur: >80 mg/dL — AB
Leukocytes, UA: NEGATIVE
Nitrite: NEGATIVE
Nitrite: NEGATIVE
Protein, ur: 30 mg/dL — AB
Protein, ur: 30 mg/dL — AB
Specific Gravity, Urine: 1.017 (ref 1.005–1.030)
Specific Gravity, Urine: 1.028 (ref 1.005–1.030)
Specific Gravity, Urine: 1.028 (ref 1.005–1.030)
Specific Gravity, Urine: 1.036 — ABNORMAL HIGH (ref 1.005–1.030)
Urobilinogen, UA: 1 mg/dL (ref 0.0–1.0)
Urobilinogen, UA: 1 mg/dL (ref 0.0–1.0)
Urobilinogen, UA: 1 mg/dL (ref 0.0–1.0)
Urobilinogen, UA: 1 mg/dL (ref 0.0–1.0)
pH: 5.5 (ref 5.0–8.0)
pH: 6.5 (ref 5.0–8.0)

## 2010-07-05 LAB — BASIC METABOLIC PANEL WITH GFR
BUN: 5 mg/dL — ABNORMAL LOW (ref 6–23)
CO2: 25 meq/L (ref 19–32)
Calcium: 8.5 mg/dL (ref 8.4–10.5)
Chloride: 106 meq/L (ref 96–112)
Creatinine, Ser: 0.71 mg/dL (ref 0.4–1.2)
GFR calc non Af Amer: >60 mL/min
Glucose, Bld: 167 mg/dL — ABNORMAL HIGH (ref 70–99)
Potassium: 3.5 meq/L (ref 3.5–5.1)
Sodium: 135 meq/L (ref 135–145)

## 2010-07-05 LAB — POCT PREGNANCY, URINE
Preg Test, Ur: NEGATIVE
Preg Test, Ur: NEGATIVE
Preg Test, Ur: NEGATIVE

## 2010-07-05 LAB — POCT I-STAT, CHEM 8
BUN: 6 mg/dL (ref 6–23)
Creatinine, Ser: 0.8 mg/dL (ref 0.4–1.2)
Potassium: 3.7 mEq/L (ref 3.5–5.1)
Sodium: 138 mEq/L (ref 135–145)

## 2010-07-05 LAB — URINE MICROSCOPIC-ADD ON

## 2010-07-05 LAB — GLUCOSE, CAPILLARY
Glucose-Capillary: 118 mg/dL — ABNORMAL HIGH (ref 70–99)
Glucose-Capillary: 124 mg/dL — ABNORMAL HIGH (ref 70–99)
Glucose-Capillary: 127 mg/dL — ABNORMAL HIGH (ref 70–99)
Glucose-Capillary: 136 mg/dL — ABNORMAL HIGH (ref 70–99)
Glucose-Capillary: 140 mg/dL — ABNORMAL HIGH (ref 70–99)
Glucose-Capillary: 140 mg/dL — ABNORMAL HIGH (ref 70–99)
Glucose-Capillary: 140 mg/dL — ABNORMAL HIGH (ref 70–99)
Glucose-Capillary: 154 mg/dL — ABNORMAL HIGH (ref 70–99)
Glucose-Capillary: 156 mg/dL — ABNORMAL HIGH (ref 70–99)
Glucose-Capillary: 194 mg/dL — ABNORMAL HIGH (ref 70–99)
Glucose-Capillary: 201 mg/dL — ABNORMAL HIGH (ref 70–99)
Glucose-Capillary: 214 mg/dL — ABNORMAL HIGH (ref 70–99)
Glucose-Capillary: 216 mg/dL — ABNORMAL HIGH (ref 70–99)
Glucose-Capillary: 250 mg/dL — ABNORMAL HIGH (ref 70–99)

## 2010-07-05 LAB — DIFFERENTIAL
Basophils Absolute: 0 10*3/uL (ref 0.0–0.1)
Basophils Absolute: 0 10*3/uL (ref 0.0–0.1)
Basophils Relative: 0 % (ref 0–1)
Basophils Relative: 0 % (ref 0–1)
Basophils Relative: 0 % (ref 0–1)
Eosinophils Absolute: 0 10*3/uL (ref 0.0–0.7)
Eosinophils Absolute: 0 10*3/uL (ref 0.0–0.7)
Eosinophils Absolute: 0.1 10*3/uL (ref 0.0–0.7)
Eosinophils Relative: 0 % (ref 0–5)
Eosinophils Relative: 0 % (ref 0–5)
Eosinophils Relative: 0 % (ref 0–5)
Eosinophils Relative: 0 % (ref 0–5)
Lymphocytes Relative: 18 % (ref 12–46)
Lymphocytes Relative: 19 % (ref 12–46)
Lymphs Abs: 2.4 10*3/uL (ref 0.7–4.0)
Lymphs Abs: 2.7 10*3/uL (ref 0.7–4.0)
Lymphs Abs: 2.8 10*3/uL (ref 0.7–4.0)
Lymphs Abs: 3 10*3/uL (ref 0.7–4.0)
Monocytes Absolute: 0.7 10*3/uL (ref 0.1–1.0)
Monocytes Absolute: 0.9 10*3/uL (ref 0.1–1.0)
Monocytes Absolute: 1.1 10*3/uL — ABNORMAL HIGH (ref 0.1–1.0)
Monocytes Relative: 6 % (ref 3–12)
Monocytes Relative: 7 % (ref 3–12)
Monocytes Relative: 9 % (ref 3–12)
Neutro Abs: 10 10*3/uL — ABNORMAL HIGH (ref 1.7–7.7)
Neutro Abs: 9.4 10*3/uL — ABNORMAL HIGH (ref 1.7–7.7)
Neutro Abs: 9.9 10*3/uL — ABNORMAL HIGH (ref 1.7–7.7)
Neutrophils Relative %: 75 % (ref 43–77)
Neutrophils Relative %: 79 % — ABNORMAL HIGH (ref 43–77)

## 2010-07-05 LAB — COMPREHENSIVE METABOLIC PANEL WITH GFR
ALT: <8 U/L (ref 0–35)
AST: 19 U/L (ref 0–37)
Albumin: 3.4 g/dL — ABNORMAL LOW (ref 3.5–5.2)
Alkaline Phosphatase: 66 U/L (ref 39–117)
BUN: 9 mg/dL (ref 6–23)
CO2: 23 meq/L (ref 19–32)
Calcium: 9.7 mg/dL (ref 8.4–10.5)
Chloride: 104 meq/L (ref 96–112)
Creatinine, Ser: 0.78 mg/dL (ref 0.4–1.2)
GFR calc non Af Amer: >60 mL/min
Glucose, Bld: 176 mg/dL — ABNORMAL HIGH (ref 70–99)
Potassium: 3.6 meq/L (ref 3.5–5.1)
Sodium: 138 meq/L (ref 135–145)
Total Bilirubin: 0.8 mg/dL (ref 0.3–1.2)
Total Protein: 8.3 g/dL (ref 6.0–8.3)

## 2010-07-05 LAB — HEPATIC FUNCTION PANEL
AST: 17 U/L (ref 0–37)
Albumin: 3.1 g/dL — ABNORMAL LOW (ref 3.5–5.2)
Alkaline Phosphatase: 48 U/L (ref 39–117)
Total Bilirubin: 0.7 mg/dL (ref 0.3–1.2)
Total Protein: 7.1 g/dL (ref 6.0–8.3)

## 2010-07-05 LAB — BASIC METABOLIC PANEL
BUN: 7 mg/dL (ref 6–23)
BUN: 8 mg/dL (ref 6–23)
CO2: 21 mEq/L (ref 19–32)
CO2: 27 mEq/L (ref 19–32)
Calcium: 8.9 mg/dL (ref 8.4–10.5)
Calcium: 9.4 mg/dL (ref 8.4–10.5)
Chloride: 102 mEq/L (ref 96–112)
Creatinine, Ser: 0.73 mg/dL (ref 0.4–1.2)
Creatinine, Ser: 0.82 mg/dL (ref 0.4–1.2)
GFR calc Af Amer: >60 mL/min (ref >60)

## 2010-07-05 LAB — POCT CARDIAC MARKERS: Myoglobin, poc: 99.1 ng/mL (ref 12–200)

## 2010-07-05 LAB — HEPATITIS B SURFACE ANTIBODY,QUALITATIVE: Hep B S Ab: NEGATIVE

## 2010-07-05 LAB — IRON AND TIBC
Iron: 114 ug/dL (ref 42–135)
Saturation Ratios: 31 % (ref 20–55)
TIBC: 368 ug/dL (ref 250–470)
UIBC: 254 ug/dL

## 2010-07-05 LAB — HEPATITIS B SURFACE ANTIGEN: Hepatitis B Surface Ag: POSITIVE — AB

## 2010-07-05 LAB — LIPASE, BLOOD
Lipase: 30 U/L (ref 11–59)
Lipase: 32 U/L (ref 11–59)
Lipase: 36 U/L (ref 11–59)

## 2010-07-05 LAB — MAGNESIUM: Magnesium: 1.7 mg/dL (ref 1.5–2.5)

## 2010-07-05 LAB — HIV ANTIBODY (ROUTINE TESTING W REFLEX): HIV: NONREACTIVE

## 2010-07-05 LAB — ANA: Anti Nuclear Antibody(ANA): NEGATIVE

## 2010-07-05 LAB — POTASSIUM: Potassium: 3.3 mEq/L — ABNORMAL LOW (ref 3.5–5.1)

## 2010-07-05 LAB — VITAMIN B12: Vitamin B-12: 420 pg/mL (ref 211–911)

## 2010-07-05 LAB — FERRITIN: Ferritin: 12 ng/mL (ref 10–291)

## 2010-07-05 LAB — URINE CULTURE: Culture  Setup Time: 201111290439

## 2010-07-05 LAB — LEUKOCYTE ALKALINE PHOSPHATASE: Leukocyte Alkaline  Phos Stain: 192 — ABNORMAL HIGH (ref 30–140)

## 2010-07-05 LAB — SEDIMENTATION RATE: Sed Rate: 47 mm/hr — ABNORMAL HIGH (ref 0–22)

## 2010-07-05 LAB — C-REACTIVE PROTEIN: CRP: 0.4 mg/dL — ABNORMAL LOW (ref <0.6)

## 2010-07-05 LAB — TROPONIN I: Troponin I: 0.01 ng/mL (ref 0.00–0.06)

## 2010-07-06 LAB — URINE CULTURE: Culture  Setup Time: 201110280249

## 2010-07-06 LAB — LIPASE, BLOOD
Lipase: 24 U/L (ref 11–59)
Lipase: 25 U/L (ref 11–59)

## 2010-07-06 LAB — DIFFERENTIAL
Basophils Absolute: 0 10*3/uL (ref 0.0–0.1)
Eosinophils Absolute: 0 10*3/uL (ref 0.0–0.7)
Eosinophils Relative: 1 % (ref 0–5)
Lymphocytes Relative: 20 % (ref 12–46)
Lymphocytes Relative: 25 % (ref 12–46)
Monocytes Absolute: 1 10*3/uL (ref 0.1–1.0)
Monocytes Relative: 8 % (ref 3–12)
Neutro Abs: 12.1 10*3/uL — ABNORMAL HIGH (ref 1.7–7.7)
Neutrophils Relative %: 67 % (ref 43–77)
Neutrophils Relative %: 72 % (ref 43–77)

## 2010-07-06 LAB — URINALYSIS, ROUTINE W REFLEX MICROSCOPIC
Glucose, UA: NEGATIVE mg/dL
Hgb urine dipstick: NEGATIVE
Ketones, ur: 15 mg/dL — AB
Ketones, ur: 40 mg/dL — AB
Nitrite: NEGATIVE
Nitrite: NEGATIVE
Specific Gravity, Urine: 1.022 (ref 1.005–1.030)
Urobilinogen, UA: 1 mg/dL (ref 0.0–1.0)
pH: 5.5 (ref 5.0–8.0)

## 2010-07-06 LAB — GLUCOSE, CAPILLARY
Glucose-Capillary: 162 mg/dL — ABNORMAL HIGH (ref 70–99)
Glucose-Capillary: 176 mg/dL — ABNORMAL HIGH (ref 70–99)

## 2010-07-06 LAB — COMPREHENSIVE METABOLIC PANEL
Albumin: 3.5 g/dL (ref 3.5–5.2)
Alkaline Phosphatase: 59 U/L (ref 39–117)
Alkaline Phosphatase: 67 U/L (ref 39–117)
BUN: 6 mg/dL (ref 6–23)
BUN: 8 mg/dL (ref 6–23)
CO2: 25 mEq/L (ref 19–32)
Calcium: 9.2 mg/dL (ref 8.4–10.5)
Chloride: 100 mEq/L (ref 96–112)
Creatinine, Ser: 0.67 mg/dL (ref 0.4–1.2)
Creatinine, Ser: 0.72 mg/dL (ref 0.4–1.2)
GFR calc non Af Amer: >60 mL/min (ref >60)
Glucose, Bld: 140 mg/dL — ABNORMAL HIGH (ref 70–99)
Glucose, Bld: 177 mg/dL — ABNORMAL HIGH (ref 70–99)
Potassium: 3.6 mEq/L (ref 3.5–5.1)
Total Bilirubin: 0.6 mg/dL (ref 0.3–1.2)
Total Protein: 8.1 g/dL (ref 6.0–8.3)

## 2010-07-06 LAB — HEMOGLOBIN A1C
Hgb A1c MFr Bld: 9.3 % — ABNORMAL HIGH (ref <5.7)
Mean Plasma Glucose: 220 mg/dL — ABNORMAL HIGH (ref <117)

## 2010-07-06 LAB — CBC
HCT: 34.9 % — ABNORMAL LOW (ref 36.0–46.0)
HCT: 35.3 % — ABNORMAL LOW (ref 36.0–46.0)
MCH: 22.5 pg — ABNORMAL LOW (ref 26.0–34.0)
MCH: 22.8 pg — ABNORMAL LOW (ref 26.0–34.0)
MCHC: 33.7 g/dL (ref 30.0–36.0)
MCV: 67.2 fL — ABNORMAL LOW (ref 78.0–100.0)
MCV: 67.8 fL — ABNORMAL LOW (ref 78.0–100.0)
Platelets: 544 10*3/uL — ABNORMAL HIGH (ref 150–400)
RBC: 5.19 MIL/uL — ABNORMAL HIGH (ref 3.87–5.11)
RDW: 18.4 % — ABNORMAL HIGH (ref 11.5–15.5)
WBC: 16.8 10*3/uL — ABNORMAL HIGH (ref 4.0–10.5)

## 2010-07-06 LAB — URINE DRUGS OF ABUSE SCREEN W ALC, ROUTINE (REF LAB)
Amphetamine Screen, Ur: NEGATIVE
Cocaine Metabolites: NEGATIVE
Creatinine,U: 425.4 mg/dL
Opiate Screen, Urine: NEGATIVE

## 2010-07-06 LAB — URINE MICROSCOPIC-ADD ON

## 2010-07-06 LAB — POCT PREGNANCY, URINE: Preg Test, Ur: NEGATIVE

## 2010-07-07 LAB — COMPREHENSIVE METABOLIC PANEL
ALT: <8 U/L (ref 0–35)
AST: 14 U/L (ref 0–37)
AST: 16 U/L (ref 0–37)
AST: 18 U/L (ref 0–37)
Albumin: 3.3 g/dL — ABNORMAL LOW (ref 3.5–5.2)
Alkaline Phosphatase: 61 U/L (ref 39–117)
Alkaline Phosphatase: 67 U/L (ref 39–117)
BUN: 6 mg/dL (ref 6–23)
CO2: 23 mEq/L (ref 19–32)
CO2: 25 mEq/L (ref 19–32)
Calcium: 9.3 mg/dL (ref 8.4–10.5)
Calcium: 9.4 mg/dL (ref 8.4–10.5)
Chloride: 100 mEq/L (ref 96–112)
Chloride: 102 mEq/L (ref 96–112)
Creatinine, Ser: 0.7 mg/dL (ref 0.4–1.2)
GFR calc Af Amer: >60 mL/min (ref >60)
GFR calc Af Amer: >60 mL/min (ref >60)
GFR calc Af Amer: >60 mL/min (ref >60)
GFR calc non Af Amer: >60 mL/min (ref >60)
GFR calc non Af Amer: >60 mL/min (ref >60)
Glucose, Bld: 160 mg/dL — ABNORMAL HIGH (ref 70–99)
Potassium: 3.8 mEq/L (ref 3.5–5.1)
Potassium: 3.8 mEq/L (ref 3.5–5.1)
Sodium: 131 mEq/L — ABNORMAL LOW (ref 135–145)
Sodium: 136 mEq/L (ref 135–145)
Total Bilirubin: 0.5 mg/dL (ref 0.3–1.2)
Total Protein: 7.9 g/dL (ref 6.0–8.3)

## 2010-07-07 LAB — HEPATIC FUNCTION PANEL
ALT: 9 U/L (ref 0–35)
AST: 21 U/L (ref 0–37)
Indirect Bilirubin: 0.3 mg/dL (ref 0.3–0.9)
Total Protein: 7.6 g/dL (ref 6.0–8.3)

## 2010-07-07 LAB — GLUCOSE, CAPILLARY
Glucose-Capillary: 124 mg/dL — ABNORMAL HIGH (ref 70–99)
Glucose-Capillary: 151 mg/dL — ABNORMAL HIGH (ref 70–99)
Glucose-Capillary: 173 mg/dL — ABNORMAL HIGH (ref 70–99)
Glucose-Capillary: 176 mg/dL — ABNORMAL HIGH (ref 70–99)
Glucose-Capillary: 184 mg/dL — ABNORMAL HIGH (ref 70–99)
Glucose-Capillary: 188 mg/dL — ABNORMAL HIGH (ref 70–99)
Glucose-Capillary: 269 mg/dL — ABNORMAL HIGH (ref 70–99)
Glucose-Capillary: 288 mg/dL — ABNORMAL HIGH (ref 70–99)
Glucose-Capillary: 308 mg/dL — ABNORMAL HIGH (ref 70–99)
Glucose-Capillary: 83 mg/dL (ref 70–99)
Glucose-Capillary: 97 mg/dL (ref 70–99)

## 2010-07-07 LAB — DIFFERENTIAL
Basophils Absolute: 0 10*3/uL (ref 0.0–0.1)
Basophils Absolute: 0 10*3/uL (ref 0.0–0.1)
Basophils Absolute: 0 10*3/uL (ref 0.0–0.1)
Basophils Relative: 0 % (ref 0–1)
Basophils Relative: 0 % (ref 0–1)
Basophils Relative: 0 % (ref 0–1)
Eosinophils Absolute: 0 10*3/uL (ref 0.0–0.7)
Eosinophils Absolute: 0 10*3/uL (ref 0.0–0.7)
Eosinophils Absolute: 0.1 10*3/uL (ref 0.0–0.7)
Eosinophils Relative: 1 % (ref 0–5)
Lymphocytes Relative: 22 % (ref 12–46)
Lymphocytes Relative: 25 % (ref 12–46)
Lymphocytes Relative: 29 % (ref 12–46)
Lymphs Abs: 2.7 10*3/uL (ref 0.7–4.0)
Monocytes Absolute: 0.9 10*3/uL (ref 0.1–1.0)
Monocytes Relative: 6 % (ref 3–12)
Neutro Abs: 10.3 10*3/uL — ABNORMAL HIGH (ref 1.7–7.7)
Neutro Abs: 10.7 10*3/uL — ABNORMAL HIGH (ref 1.7–7.7)
Neutro Abs: 13.2 10*3/uL — ABNORMAL HIGH (ref 1.7–7.7)
Neutrophils Relative %: 65 % (ref 43–77)
Neutrophils Relative %: 68 % (ref 43–77)
Neutrophils Relative %: 72 % (ref 43–77)

## 2010-07-07 LAB — URINALYSIS, ROUTINE W REFLEX MICROSCOPIC
Bilirubin Urine: NEGATIVE
Glucose, UA: 100 mg/dL — AB
Glucose, UA: 250 mg/dL — AB
Glucose, UA: >1000 mg/dL — AB
Hgb urine dipstick: NEGATIVE
Ketones, ur: 15 mg/dL — AB
Ketones, ur: 15 mg/dL — AB
Nitrite: NEGATIVE
Nitrite: NEGATIVE
Protein, ur: NEGATIVE mg/dL
Protein, ur: NEGATIVE mg/dL
Protein, ur: NEGATIVE mg/dL
Specific Gravity, Urine: 1.021 (ref 1.005–1.030)
Specific Gravity, Urine: 1.025 (ref 1.005–1.030)
Urobilinogen, UA: 1 mg/dL (ref 0.0–1.0)
Urobilinogen, UA: 1 mg/dL (ref 0.0–1.0)
pH: 7 (ref 5.0–8.0)
pH: 7.5 (ref 5.0–8.0)

## 2010-07-07 LAB — BASIC METABOLIC PANEL
CO2: 25 mEq/L (ref 19–32)
Calcium: 8.7 mg/dL (ref 8.4–10.5)
Calcium: 9.1 mg/dL (ref 8.4–10.5)
Creatinine, Ser: 0.88 mg/dL (ref 0.4–1.2)
GFR calc Af Amer: >60 mL/min (ref >60)
GFR calc Af Amer: >60 mL/min (ref >60)
GFR calc non Af Amer: >60 mL/min (ref >60)
Glucose, Bld: 266 mg/dL — ABNORMAL HIGH (ref 70–99)
Sodium: 138 mEq/L (ref 135–145)

## 2010-07-07 LAB — CBC
Hemoglobin: 10.6 g/dL — ABNORMAL LOW (ref 12.0–15.0)
Hemoglobin: 10.8 g/dL — ABNORMAL LOW (ref 12.0–15.0)
Hemoglobin: 11.2 g/dL — ABNORMAL LOW (ref 12.0–15.0)
MCH: 22.1 pg — ABNORMAL LOW (ref 26.0–34.0)
MCH: 22.4 pg — ABNORMAL LOW (ref 26.0–34.0)
MCHC: 33.2 g/dL (ref 30.0–36.0)
MCHC: 33.8 g/dL (ref 30.0–36.0)
Platelets: 490 10*3/uL — ABNORMAL HIGH (ref 150–400)
Platelets: 498 10*3/uL — ABNORMAL HIGH (ref 150–400)
Platelets: 499 10*3/uL — ABNORMAL HIGH (ref 150–400)
RBC: 4.78 MIL/uL (ref 3.87–5.11)
RBC: 5.13 MIL/uL — ABNORMAL HIGH (ref 3.87–5.11)
RDW: 17.9 % — ABNORMAL HIGH (ref 11.5–15.5)
RDW: 18.5 % — ABNORMAL HIGH (ref 11.5–15.5)
WBC: 16.9 10*3/uL — ABNORMAL HIGH (ref 4.0–10.5)

## 2010-07-07 LAB — URINE MICROSCOPIC-ADD ON

## 2010-07-07 LAB — HEMOGLOBIN A1C: Mean Plasma Glucose: 246 mg/dL — ABNORMAL HIGH (ref <117)

## 2010-07-07 LAB — URINE CULTURE
Colony Count: 30000
Colony Count: >=100000
Culture  Setup Time: 201110030038
Culture  Setup Time: 201110042323
Culture  Setup Time: 201110131832

## 2010-07-07 LAB — RAPID URINE DRUG SCREEN, HOSP PERFORMED: Barbiturates: NOT DETECTED

## 2010-07-07 LAB — TSH: TSH: 0.934 u[IU]/mL (ref 0.350–4.500)

## 2010-07-07 LAB — LIPID PANEL
LDL Cholesterol: 96 mg/dL (ref 0–99)
Triglycerides: 146 mg/dL (ref <150)

## 2010-07-07 LAB — PREGNANCY, URINE: Preg Test, Ur: NEGATIVE

## 2010-07-07 LAB — LIPASE, BLOOD
Lipase: 28 U/L (ref 11–59)
Lipase: 31 U/L (ref 11–59)

## 2010-07-08 LAB — CBC
HCT: 30.1 % — ABNORMAL LOW (ref 36.0–46.0)
HCT: 30.3 % — ABNORMAL LOW (ref 36.0–46.0)
HCT: 32.2 % — ABNORMAL LOW (ref 36.0–46.0)
Hemoglobin: 10 g/dL — ABNORMAL LOW (ref 12.0–15.0)
Hemoglobin: 10.5 g/dL — ABNORMAL LOW (ref 12.0–15.0)
MCH: 22.2 pg — ABNORMAL LOW (ref 26.0–34.0)
MCHC: 33 g/dL (ref 30.0–36.0)
MCHC: 33.2 g/dL (ref 30.0–36.0)
Platelets: 410 10*3/uL — ABNORMAL HIGH (ref 150–400)
RBC: 4.57 MIL/uL (ref 3.87–5.11)
RDW: 17 % — ABNORMAL HIGH (ref 11.5–15.5)
RDW: 17.4 % — ABNORMAL HIGH (ref 11.5–15.5)
WBC: 14.4 10*3/uL — ABNORMAL HIGH (ref 4.0–10.5)

## 2010-07-08 LAB — BASIC METABOLIC PANEL
BUN: 10 mg/dL (ref 6–23)
Creatinine, Ser: 0.8 mg/dL (ref 0.4–1.2)
GFR calc non Af Amer: >60 mL/min (ref >60)
Glucose, Bld: 337 mg/dL — ABNORMAL HIGH (ref 70–99)
Potassium: 3.9 mEq/L (ref 3.5–5.1)

## 2010-07-08 LAB — GLUCOSE, CAPILLARY
Glucose-Capillary: 484 mg/dL — ABNORMAL HIGH (ref 70–99)
Glucose-Capillary: 243 mg/dL — ABNORMAL HIGH (ref 70–99)
Glucose-Capillary: 282 mg/dL — ABNORMAL HIGH (ref 70–99)
Glucose-Capillary: 285 mg/dL — ABNORMAL HIGH (ref 70–99)
Glucose-Capillary: 290 mg/dL — ABNORMAL HIGH (ref 70–99)
Glucose-Capillary: 294 mg/dL — ABNORMAL HIGH (ref 70–99)
Glucose-Capillary: 296 mg/dL — ABNORMAL HIGH (ref 70–99)
Glucose-Capillary: 317 mg/dL — ABNORMAL HIGH (ref 70–99)
Glucose-Capillary: 411 mg/dL — ABNORMAL HIGH (ref 70–99)

## 2010-07-08 LAB — TROPONIN I: Troponin I: 0.01 ng/mL (ref 0.00–0.06)

## 2010-07-08 LAB — COMPREHENSIVE METABOLIC PANEL
ALT: 9 U/L (ref 0–35)
AST: 17 U/L (ref 0–37)
Alkaline Phosphatase: 57 U/L (ref 39–117)
CO2: 26 mEq/L (ref 19–32)
Chloride: 100 mEq/L (ref 96–112)
GFR calc Af Amer: >60 mL/min (ref >60)
GFR calc non Af Amer: >60 mL/min (ref >60)
Glucose, Bld: 395 mg/dL — ABNORMAL HIGH (ref 70–99)
Sodium: 133 mEq/L — ABNORMAL LOW (ref 135–145)
Total Bilirubin: 0.4 mg/dL (ref 0.3–1.2)

## 2010-07-08 LAB — IRON AND TIBC
Saturation Ratios: 9 % — ABNORMAL LOW (ref 20–55)
TIBC: 322 ug/dL (ref 250–470)

## 2010-07-08 LAB — FOLATE: Folate: 5.5 ng/mL

## 2010-07-08 LAB — PREGNANCY, URINE: Preg Test, Ur: NEGATIVE

## 2010-07-08 LAB — RETICULOCYTES
Retic Count, Absolute: 73.1 10*3/uL (ref 19.0–186.0)
Retic Ct Pct: 1.6 % (ref 0.4–3.1)

## 2010-07-08 LAB — CK TOTAL AND CKMB (NOT AT ARMC): CK, MB: 0.8 ng/mL (ref 0.3–4.0)

## 2010-07-08 LAB — VITAMIN B12: Vitamin B-12: 418 pg/mL (ref 211–911)

## 2010-07-09 LAB — D-DIMER, QUANTITATIVE: D-Dimer, Quant: <0.22 ug/mL-FEU (ref 0.00–0.48)

## 2010-07-09 LAB — CBC
Platelets: 420 10*3/uL — ABNORMAL HIGH (ref 150–400)
RBC: 5.08 MIL/uL (ref 3.87–5.11)
RDW: 18.1 % — ABNORMAL HIGH (ref 11.5–15.5)
WBC: 13.5 10*3/uL — ABNORMAL HIGH (ref 4.0–10.5)

## 2010-07-09 LAB — POCT I-STAT, CHEM 8
Calcium, Ion: 1.17 mmol/L (ref 1.12–1.32)
Chloride: 104 mEq/L (ref 96–112)
HCT: 38 % (ref 36.0–46.0)
Potassium: 3.8 mEq/L (ref 3.5–5.1)
Sodium: 135 mEq/L (ref 135–145)

## 2010-07-09 LAB — DIFFERENTIAL
Basophils Absolute: 0.1 10*3/uL (ref 0.0–0.1)
Eosinophils Relative: 2 % (ref 0–5)
Lymphocytes Relative: 25 % (ref 12–46)
Neutro Abs: 8.8 10*3/uL — ABNORMAL HIGH (ref 1.7–7.7)
Neutrophils Relative %: 65 % (ref 43–77)

## 2010-07-09 LAB — RAPID URINE DRUG SCREEN, HOSP PERFORMED
Amphetamines: NOT DETECTED
Benzodiazepines: NOT DETECTED
Tetrahydrocannabinol: NOT DETECTED

## 2010-07-09 LAB — URINALYSIS, ROUTINE W REFLEX MICROSCOPIC
Nitrite: NEGATIVE
Protein, ur: NEGATIVE mg/dL
Urobilinogen, UA: 0.2 mg/dL (ref 0.0–1.0)

## 2010-07-09 LAB — TROPONIN I: Troponin I: 0.01 ng/mL (ref 0.00–0.06)

## 2010-07-09 LAB — POCT CARDIAC MARKERS: Troponin i, poc: <0.05 ng/mL (ref 0.00–0.09)

## 2010-07-09 LAB — TSH: TSH: 1.71 u[IU]/mL (ref 0.350–4.500)

## 2010-07-09 LAB — CK TOTAL AND CKMB (NOT AT ARMC): CK, MB: 0.7 ng/mL (ref 0.3–4.0)

## 2010-07-09 LAB — URINE MICROSCOPIC-ADD ON

## 2010-07-09 LAB — GLUCOSE, CAPILLARY

## 2010-07-10 LAB — DIFFERENTIAL
Basophils Absolute: 0.2 10*3/uL — ABNORMAL HIGH (ref 0.0–0.1)
Eosinophils Relative: 1 % (ref 0–5)
Lymphocytes Relative: 30 % (ref 12–46)
Monocytes Absolute: 0.8 10*3/uL (ref 0.1–1.0)
Monocytes Relative: 6 % (ref 3–12)
Neutro Abs: 7.8 10*3/uL — ABNORMAL HIGH (ref 1.7–7.7)

## 2010-07-10 LAB — URINE CULTURE: Colony Count: >=100000

## 2010-07-10 LAB — GLUCOSE, CAPILLARY
Glucose-Capillary: 223 mg/dL — ABNORMAL HIGH (ref 70–99)
Glucose-Capillary: 251 mg/dL — ABNORMAL HIGH (ref 70–99)
Glucose-Capillary: 362 mg/dL — ABNORMAL HIGH (ref 70–99)

## 2010-07-10 LAB — URINALYSIS, ROUTINE W REFLEX MICROSCOPIC
Bilirubin Urine: NEGATIVE
Glucose, UA: >1000 mg/dL — AB
Ketones, ur: 15 mg/dL — AB
Nitrite: POSITIVE — AB
Specific Gravity, Urine: 1.025 (ref 1.005–1.030)
pH: 6 (ref 5.0–8.0)

## 2010-07-10 LAB — POCT CARDIAC MARKERS
CKMB, poc: <1 ng/mL — ABNORMAL LOW (ref 1.0–8.0)
Troponin i, poc: <0.05 ng/mL (ref 0.00–0.09)

## 2010-07-10 LAB — CBC
HCT: 30.6 % — ABNORMAL LOW (ref 36.0–46.0)
Hemoglobin: 10.2 g/dL — ABNORMAL LOW (ref 12.0–15.0)
MCV: 70.2 fL — ABNORMAL LOW (ref 78.0–100.0)
RDW: 18.8 % — ABNORMAL HIGH (ref 11.5–15.5)
WBC: 12.6 10*3/uL — ABNORMAL HIGH (ref 4.0–10.5)

## 2010-07-10 LAB — BASIC METABOLIC PANEL
BUN: 8 mg/dL (ref 6–23)
Chloride: 104 mEq/L (ref 96–112)
Glucose, Bld: 342 mg/dL — ABNORMAL HIGH (ref 70–99)
Potassium: 4.7 mEq/L (ref 3.5–5.1)
Sodium: 137 mEq/L (ref 135–145)

## 2010-07-10 LAB — POCT PREGNANCY, URINE: Preg Test, Ur: NEGATIVE

## 2010-07-10 LAB — URINE MICROSCOPIC-ADD ON

## 2010-07-12 LAB — COMPREHENSIVE METABOLIC PANEL
Albumin: 2.9 g/dL — ABNORMAL LOW (ref 3.5–5.2)
Alkaline Phosphatase: 57 U/L (ref 39–117)
BUN: 8 mg/dL (ref 6–23)
Potassium: 3.5 mEq/L (ref 3.5–5.1)
Sodium: 133 mEq/L — ABNORMAL LOW (ref 135–145)
Total Protein: 7.1 g/dL (ref 6.0–8.3)

## 2010-07-12 LAB — URINALYSIS, ROUTINE W REFLEX MICROSCOPIC
Glucose, UA: >1000 mg/dL — AB
Ketones, ur: 15 mg/dL — AB
Protein, ur: NEGATIVE mg/dL
pH: 5.5 (ref 5.0–8.0)

## 2010-07-12 LAB — POCT CARDIAC MARKERS
CKMB, poc: <1 ng/mL — ABNORMAL LOW (ref 1.0–8.0)
Myoglobin, poc: 58.8 ng/mL (ref 12–200)
Myoglobin, poc: 67 ng/mL (ref 12–200)
Troponin i, poc: <0.05 ng/mL (ref 0.00–0.09)

## 2010-07-12 LAB — CBC
Platelets: 451 10*3/uL — ABNORMAL HIGH (ref 150–400)
RDW: 18.3 % — ABNORMAL HIGH (ref 11.5–15.5)

## 2010-07-12 LAB — DIFFERENTIAL
Basophils Relative: 0 % (ref 0–1)
Monocytes Absolute: 1.3 10*3/uL — ABNORMAL HIGH (ref 0.1–1.0)
Monocytes Relative: 9 % (ref 3–12)
Neutro Abs: 11.5 10*3/uL — ABNORMAL HIGH (ref 1.7–7.7)

## 2010-07-12 LAB — URINE MICROSCOPIC-ADD ON

## 2010-07-15 ENCOUNTER — Emergency Department (HOSPITAL_COMMUNITY)
Admission: EM | Admit: 2010-07-15 | Discharge: 2010-07-15 | Disposition: A | Discharge status: Home / Self Care | Payer: Medicaid Other | Source: Home / Self Care | Attending: Emergency Medicine | Admitting: Emergency Medicine

## 2010-07-15 DIAGNOSIS — E119 Type 2 diabetes mellitus without complications: Secondary | ICD-10-CM | POA: Insufficient documentation

## 2010-07-15 DIAGNOSIS — R109 Unspecified abdominal pain: Secondary | ICD-10-CM | POA: Insufficient documentation

## 2010-07-15 DIAGNOSIS — I2510 Atherosclerotic heart disease of native coronary artery without angina pectoris: Secondary | ICD-10-CM | POA: Insufficient documentation

## 2010-07-15 DIAGNOSIS — Z794 Long term (current) use of insulin: Secondary | ICD-10-CM | POA: Insufficient documentation

## 2010-07-15 DIAGNOSIS — I10 Essential (primary) hypertension: Secondary | ICD-10-CM | POA: Insufficient documentation

## 2010-07-15 DIAGNOSIS — I252 Old myocardial infarction: Secondary | ICD-10-CM | POA: Insufficient documentation

## 2010-07-15 DIAGNOSIS — K2970 Gastritis, unspecified, without bleeding: Secondary | ICD-10-CM | POA: Insufficient documentation

## 2010-07-15 LAB — GLUCOSE, CAPILLARY: Glucose-Capillary: 140 mg/dL — ABNORMAL HIGH (ref 70–99)

## 2010-07-18 ENCOUNTER — Encounter: Payer: Self-pay | Admitting: Internal Medicine

## 2010-07-18 ENCOUNTER — Ambulatory Visit (INDEPENDENT_AMBULATORY_CARE_PROVIDER_SITE_OTHER): Payer: Medicaid Other | Admitting: Internal Medicine

## 2010-07-18 ENCOUNTER — Inpatient Hospital Stay (HOSPITAL_COMMUNITY)
Admission: AD | Admit: 2010-07-18 | Discharge: 2010-07-21 | DRG: 074 | Disposition: A | Discharge status: Home / Self Care | Payer: Medicaid Other | Source: Ambulatory Visit | Attending: Internal Medicine | Admitting: Internal Medicine

## 2010-07-18 ENCOUNTER — Inpatient Hospital Stay (HOSPITAL_COMMUNITY): Payer: Medicaid Other

## 2010-07-18 ENCOUNTER — Encounter: Payer: Self-pay | Admitting: Ophthalmology

## 2010-07-18 DIAGNOSIS — R82998 Other abnormal findings in urine: Secondary | ICD-10-CM | POA: Diagnosis present

## 2010-07-18 DIAGNOSIS — Z794 Long term (current) use of insulin: Secondary | ICD-10-CM

## 2010-07-18 DIAGNOSIS — I2510 Atherosclerotic heart disease of native coronary artery without angina pectoris: Secondary | ICD-10-CM

## 2010-07-18 DIAGNOSIS — D6489 Other specified anemias: Secondary | ICD-10-CM | POA: Diagnosis present

## 2010-07-18 DIAGNOSIS — R112 Nausea with vomiting, unspecified: Secondary | ICD-10-CM

## 2010-07-18 DIAGNOSIS — F3289 Other specified depressive episodes: Secondary | ICD-10-CM | POA: Diagnosis present

## 2010-07-18 DIAGNOSIS — E1149 Type 2 diabetes mellitus with other diabetic neurological complication: Principal | ICD-10-CM | POA: Diagnosis present

## 2010-07-18 DIAGNOSIS — E119 Type 2 diabetes mellitus without complications: Secondary | ICD-10-CM

## 2010-07-18 DIAGNOSIS — Z7902 Long term (current) use of antithrombotics/antiplatelets: Secondary | ICD-10-CM

## 2010-07-18 DIAGNOSIS — R109 Unspecified abdominal pain: Secondary | ICD-10-CM

## 2010-07-18 DIAGNOSIS — J45909 Unspecified asthma, uncomplicated: Secondary | ICD-10-CM | POA: Diagnosis present

## 2010-07-18 DIAGNOSIS — I10 Essential (primary) hypertension: Secondary | ICD-10-CM | POA: Diagnosis present

## 2010-07-18 DIAGNOSIS — D509 Iron deficiency anemia, unspecified: Secondary | ICD-10-CM

## 2010-07-18 DIAGNOSIS — Z79899 Other long term (current) drug therapy: Secondary | ICD-10-CM

## 2010-07-18 DIAGNOSIS — K3184 Gastroparesis: Secondary | ICD-10-CM | POA: Diagnosis present

## 2010-07-18 DIAGNOSIS — F329 Major depressive disorder, single episode, unspecified: Secondary | ICD-10-CM | POA: Diagnosis present

## 2010-07-18 LAB — COMPREHENSIVE METABOLIC PANEL
ALT: 8 U/L (ref 0–35)
AST: 20 U/L (ref 0–37)
Alkaline Phosphatase: 61 U/L (ref 39–117)
CO2: 23 mEq/L (ref 19–32)
GFR calc Af Amer: >60 mL/min (ref >60)
GFR calc non Af Amer: >60 mL/min (ref >60)
Glucose, Bld: 140 mg/dL — ABNORMAL HIGH (ref 70–99)
Potassium: 3.3 mEq/L — ABNORMAL LOW (ref 3.5–5.1)
Sodium: 132 mEq/L — ABNORMAL LOW (ref 135–145)

## 2010-07-18 LAB — DIFFERENTIAL
Basophils Absolute: 0 10*3/uL (ref 0.0–0.1)
Lymphocytes Relative: 19 % (ref 12–46)
Monocytes Relative: 7 % (ref 3–12)
Neutrophils Relative %: 73 % (ref 43–77)

## 2010-07-18 LAB — URINE MICROSCOPIC-ADD ON

## 2010-07-18 LAB — CBC
HCT: 30.3 % — ABNORMAL LOW (ref 36.0–46.0)
Hemoglobin: 10 g/dL — ABNORMAL LOW (ref 12.0–15.0)
RBC: 4.63 MIL/uL (ref 3.87–5.11)
WBC: 16.2 10*3/uL — ABNORMAL HIGH (ref 4.0–10.5)

## 2010-07-18 LAB — URINALYSIS, ROUTINE W REFLEX MICROSCOPIC
Glucose, UA: NEGATIVE mg/dL
Leukocytes, UA: NEGATIVE
Protein, ur: NEGATIVE mg/dL
pH: 6.5 (ref 5.0–8.0)

## 2010-07-18 LAB — GLUCOSE, CAPILLARY
Glucose-Capillary: 160 mg/dL — ABNORMAL HIGH (ref 70–99)
Glucose-Capillary: 159 mg/dL — ABNORMAL HIGH (ref 70–99)

## 2010-07-18 LAB — RAPID URINE DRUG SCREEN, HOSP PERFORMED
Barbiturates: NOT DETECTED
Benzodiazepines: NOT DETECTED

## 2010-07-18 LAB — LIPASE, BLOOD: Lipase: 29 U/L (ref 11–59)

## 2010-07-18 NOTE — Assessment & Plan Note (Signed)
Patient orthostatic. Patient will be admitted by primary team. Will rule out pregnancy, electrolyte abnormalities, underlying infectious causes, hydrate, check H. Pylori stool antigen for resolution of infection, and consider GI consult for EGD/Colonoscopy?Marland Kitchen

## 2010-07-18 NOTE — H&P (Signed)
INTERNAL MEDICINE ADMISSION HISTORY AND PHYSICAL Attending: Dr. Phillips Odor First Contact: Dr. Cathey Endow [phone] Second Contact: Dr. Baltazar Apo [phone] PCP: Dr. Cena Benton  CC: Nausea/vomiting  HPI: This is a 35 year old female with a past medical history significant for uncontrolled type II DM (A1C 9.3 in October but has been >14 numerous times in the past), asthma and hx of anemia (heterozygous for hemoglobin C) who presents with a 4 month hx of nausea and vomiting.  The onset was insidious and the patient states that she vomits several times daily (8 times on the day of admission).  Patient was seen December but GI refused to consult as they feel that her symptoms are consistent with gastroparesis at that prior gastric emptying study is falsely negative as pt had been on Reglan.  Pt's Reglan should discontinued and pt worked up there after. Pt had EGD in November which demonstrated H-Pylori and mild gastritis for which she has completed to rounds of RX.  Biopsy of the ilium was negative for celiac disease. Pt has also had work up with abdominal CT scan, transvaginal ultrasound and transabdominal  ultrasound, all of which have been unrevealing.  With regards to the patients pain, it is described as crampy,  Constant, 10/10 in severity and worsened by vomiting.  Pt states that any PO intake causes vomiting and she has to eat small meals most of which are clear liquids. Pt denies, sick contacts, fevers, chills, or change in her BM's though she does complain of occasional chronic constipation.    ALLERGIES: Allergies . 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 aureus- sensitive to all abx   MEDICATIONS: Plavix 75 mg po qday LANTUS SOLOSTAR 100 UNIT/ML SOLN (INSULIN GLARGINE) Inject 15 units subcutaneously once a day until your  FERROUS GLUCONATE 324 (38 FE) MG TABS (FERROUS GLUCONATE) Take one tablet by mouth two times a day on an empty stomach or if not possible take with water. NEURONTIN 300 MG CAPS (GABAPENTIN) Take 1 tablet by mouth three times a day METOPROLOL TARTRATE 25 MG TABS (METOPROLOL TARTRATE) Take 1 tablet by mouth two times a day ONDANSETRON HCL 4 MG TABS (ONDANSETRON HCL) Take 1 tab every 4 hours as needed for nausea. PROMETHAZINE HCL 12.5 MG TABS (PROMETHAZINE HCL) Take one tablet by mouth every 6 hours as needed for nausea and vomiting HYOSCYAMINE SULFATE 0.125 MG TABS (HYOSCYAMINE SULFATE) Take one tablet by mouth every 4 hours as needed for cramping OMEPRAZOLE 40 MG CPDR (OMEPRAZOLE) Take one tablet by mouth two times a day KETOROLAC TROMETHAMINE 10 MG TABS (KETOROLAC TROMETHAMINE) Please take 1 tablet every 6 hrs as needed for pain Lyrica 75mg  PO daily.   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 Now,  unemployed.  Does not smoke, drinks alcohol occasionally, does not use illicit drugs.  FAMILY HISTORY Father: DM  Mother: Lupus  Grandfather MI at age 35 Aunt MI  ROS: Negative as per HPI.   VITALS: T: 98.3 P:121  BP:139/100 Orthostatic VS: Laying:168/120, 93  Standing:139/100, 121  PHYSICAL EXAM: General:  alert, well-developed, and cooperative to examination.   Head:  normocephalic and atraumatic.   Eyes:  vision grossly intact, pupils equal,  pupils round, pupils reactive to light, no injection and anicteric.   Mouth:  pharynx pink and moist, no erythema, and no exudates.   Neck:  supple, full ROM, no thyromegaly, no JVD, and no carotid bruits.   Lungs:  normal respiratory effort, no accessory muscle use, normal breath sounds, no crackles, and no wheezes.  Heart:  normal rate, regular rhythm, no murmur, no gallop, and no rub.   Abdomen:  soft, tender to palpation in the left lower quadrant, normal bowel sounds, no distention, no guarding, no rebound tenderness, no hepatomegaly, and no splenomegaly.   Msk:  no joint swelling, no joint warmth, and no redness over joints.   Pulses:  2+ DP/PT pulses bilaterally Extremities:  No cyanosis, clubbing, edema  Neurologic:  alert & oriented X3, cranial nerves II-XII intact, strength normal in all extremities, sensation intact to light touch.  Skin:  turgor normal and no rashes.   Psych:  Oriented X3, Mildly anxious appearing.   LABS: CBG: 160  ASSESSMENT AND PLAN: This is a 35 year old female with two prior admissions for refractory nausea/vomiting felt by GI to be secondary to gastroparesis.  1. Nausea/Vomiting: The DDX includes gastroparesis, continued H-Pylori gastritis, functional GI disorder, inflamatory bowel disease and  Ingestion.  The latter is unlikely as the pt has no unusual intake and has poor PO intake in general, I also do not think that the pt has an inflammatory bowel disease as she denies diarrhea or blood in stool, that being said, pt has never had a colonoscopy so we will repeat SED rate and consider colonoscopy if high. At this point I agree with GI's prior assessment that this is likely gastroparesis given the presentation.  Plan:  -Admit to regular floor Dr. Phillips Odor attending  -Will hydrate with NS 100 cc/h  -Will treat with zofran and phenergan PRN for nausea  -Will check CBC, CMET, Lipase, UDS, UA, and ESR, UPT.  2. Anemia/thrombocytosis: Last HGB 11.0 and  platelet count 531 in 1/12, pt has been seen bu Dr. Darrold Span from Hem/Onc who felt that the anemia was most likely secondary to iron deficiency  (due to menorrhagia) vs. ? Hemoglobin C mutation though hemoglobin electrophoresis has never been done.  Thrombocytosis is reportedly expected in iron deficiency anemia vs. inflammation due to chronic hepatitis B.   Will recheck CBC today and continue to monitor.  3. Type II DM: This has been very uncontrolled in the past but most recent A1C 7.8 will continue lantus at 15 units with sliding scale sensitive coverage.   4. PPX: Lovenox   Attending Physician: I have seen and examined the patient. I reviewed the resident/fellow note and agree with the findings and plan of care as documented. My additions and revisions are included.   Signature:   Date:

## 2010-07-18 NOTE — Progress Notes (Signed)
  Subjective:    Patient ID: Shirley [name], female    DOB: 1976-01-20, 35 y.o.   MRN: [id number]  HPI  35 yo woman with  Past Medical History  Diagnosis Date  . Thrombocytosis     Hem/Onc suggested 2/2 chronic hepatits and/or iron deficiency anemia  . Iron deficiency anemia   . N&V (nausea and vomiting)     and  Abdominal pain, chronic. Unclear ethiology with multiple admission and ED visits. Work up including Abdominal xray showed mostly nonspecific bowel gas pattern, CT abdomen with and without contrast (01/2010)  showed no acute process. Gastic Emptying scan (01/2010) was normal. Ultrasound of the abdomen was within normal limits. Hepatitis B viral load was undectable. HIV nonreactive. EGD (11/09/  . Abdominal pain   . Peripheral neuropathy   . Hypertension   . Hyperlipidemia   . Diabetes mellitus   . Recurrent boils   . Depression   . Fibromyalgia   . Back pain   . OSA (obstructive sleep apnea)   . 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 Cath 12/10- no restenosis or significant CAD progression  . Hepatitis B, chronic     Hep BeAb+,Hep B cAb+ & Hep BsAg+ (9/06)  . Irregular menses     Small ovarian follicles seen on CT(9/06)  . History of pyelonephritis     H/o GrpB Pyelonephritis (9/06) and UTI- 07/11- E.Coli, 12/10- GBS  . Abscess of tunica vaginalis     10/09- Abundant S. aureus- sensitive to all abx  . Obesity   . Asthma    comes to the clinic for chronic abdominal and vomiting. Abdominal Pain localized to left quadrant. Constant. Described as throbbing, contraction. Alleviated with percocet. Aggravated with vomiting. Vomiting mucous, stomach acid, streaks of blood. Patient usually throws up around 3 times a day. Alleviated with phenergan. Patient was found to have Helicobacter Pylori in 1111 since then she has had two treatments. Finished last treatment [id number].    Review of Systems  [all other systems reviewed and are negative       Objective:   Physical Exam  General:  NAD Mouth:  dry mucous membranes Lungs:  normal respiratory effort, normal breath sounds, no crackles, and no wheezes.   Heart:  normal rate, regular rhythm, no murmur, no gallop, no rub, and no JVD.   Abdomen:  ttp left quadrant, normal bowel sounds, no rebound tenderness, no masses, no guarding, no rigidity Extremities:  no edema  Neurologic:  Nonfocal    Assessment & Plan:

## 2010-07-19 ENCOUNTER — Encounter: Payer: Self-pay | Admitting: Internal Medicine

## 2010-07-19 DIAGNOSIS — R109 Unspecified abdominal pain: Secondary | ICD-10-CM

## 2010-07-19 DIAGNOSIS — R112 Nausea with vomiting, unspecified: Secondary | ICD-10-CM

## 2010-07-19 LAB — CBC
HCT: 30.1 % — ABNORMAL LOW (ref 36.0–46.0)
Hemoglobin: 9.7 g/dL — ABNORMAL LOW (ref 12.0–15.0)
MCV: 64.5 fL — ABNORMAL LOW (ref 78.0–100.0)
RBC: 4.67 MIL/uL (ref 3.87–5.11)
RDW: 18.3 % — ABNORMAL HIGH (ref 11.5–15.5)
WBC: 17.5 10*3/uL — ABNORMAL HIGH (ref 4.0–10.5)

## 2010-07-19 LAB — BASIC METABOLIC PANEL
BUN: 8 mg/dL (ref 6–23)
CO2: 24 mEq/L (ref 19–32)
Chloride: 102 mEq/L (ref 96–112)
Glucose, Bld: 156 mg/dL — ABNORMAL HIGH (ref 70–99)
Potassium: 3.6 mEq/L (ref 3.5–5.1)
Sodium: 135 mEq/L (ref 135–145)

## 2010-07-19 LAB — GLUCOSE, CAPILLARY
Glucose-Capillary: 127 mg/dL — ABNORMAL HIGH (ref 70–99)
Glucose-Capillary: 119 mg/dL — ABNORMAL HIGH (ref 70–99)

## 2010-07-20 ENCOUNTER — Telehealth: Payer: Self-pay | Admitting: Internal Medicine

## 2010-07-20 DIAGNOSIS — R109 Unspecified abdominal pain: Secondary | ICD-10-CM

## 2010-07-20 DIAGNOSIS — R112 Nausea with vomiting, unspecified: Secondary | ICD-10-CM

## 2010-07-20 LAB — GLUCOSE, CAPILLARY
Glucose-Capillary: 143 mg/dL — ABNORMAL HIGH (ref 70–99)
Glucose-Capillary: 87 mg/dL (ref 70–99)
Glucose-Capillary: 119 mg/dL — ABNORMAL HIGH (ref 70–99)
Glucose-Capillary: 99 mg/dL (ref 70–99)
Glucose-Capillary: 88 mg/dL (ref 70–99)

## 2010-07-20 LAB — CBC
Hemoglobin: 8.8 g/dL — ABNORMAL LOW (ref 12.0–15.0)
MCH: 20.6 pg — ABNORMAL LOW (ref 26.0–34.0)
Platelets: 601 10*3/uL — ABNORMAL HIGH (ref 150–400)
RBC: 4.27 MIL/uL (ref 3.87–5.11)
WBC: 13 10*3/uL — ABNORMAL HIGH (ref 4.0–10.5)

## 2010-07-20 LAB — DIFFERENTIAL
Basophils Relative: 0 % (ref 0–1)
Eosinophils Absolute: 0.1 10*3/uL (ref 0.0–0.7)
Eosinophils Relative: 1 % (ref 0–5)
Lymphocytes Relative: 34 % (ref 12–46)
Monocytes Absolute: 0.9 10*3/uL (ref 0.1–1.0)
Neutrophils Relative %: 58 % (ref 43–77)

## 2010-07-20 NOTE — Telephone Encounter (Signed)
Spoke with Dr. Boris Lown) and scheduled patient with Dr. Russella Dar on 08/29/10 at 2:30 PM for nausea, vomiting and abd. Pain. (Dr. Russella Dar did hospital procedure on patient in 2011)

## 2010-07-21 DIAGNOSIS — R112 Nausea with vomiting, unspecified: Secondary | ICD-10-CM

## 2010-07-21 LAB — URINALYSIS, ROUTINE W REFLEX MICROSCOPIC
Bilirubin Urine: NEGATIVE
Glucose, UA: NEGATIVE mg/dL
Hgb urine dipstick: NEGATIVE
Ketones, ur: 15 mg/dL — AB
Protein, ur: NEGATIVE mg/dL
Urobilinogen, UA: 2 mg/dL — ABNORMAL HIGH (ref 0.0–1.0)

## 2010-07-21 NOTE — Discharge Summary (Signed)
For complete hospital discharge summery and follow up, please see Echart.   For medications at time of discharge, see updated medications.

## 2010-07-25 LAB — CBC
HCT: 34.8 % — ABNORMAL LOW (ref 36.0–46.0)
MCHC: 33.2 g/dL (ref 30.0–36.0)
MCV: 73.7 fL — ABNORMAL LOW (ref 78.0–100.0)
Platelets: 470 10*3/uL — ABNORMAL HIGH (ref 150–400)
RDW: 17.1 % — ABNORMAL HIGH (ref 11.5–15.5)
WBC: 13 10*3/uL — ABNORMAL HIGH (ref 4.0–10.5)

## 2010-07-25 LAB — POCT CARDIAC MARKERS: Troponin i, poc: <0.05 ng/mL (ref 0.00–0.09)

## 2010-07-25 LAB — CK TOTAL AND CKMB (NOT AT ARMC)
Relative Index: INVALID (ref 0.0–2.5)
Total CK: 94 U/L (ref 7–177)

## 2010-07-25 LAB — GLUCOSE, CAPILLARY
Glucose-Capillary: 245 mg/dL — ABNORMAL HIGH (ref 70–99)
Glucose-Capillary: 290 mg/dL — ABNORMAL HIGH (ref 70–99)

## 2010-07-25 LAB — BASIC METABOLIC PANEL
BUN: 11 mg/dL (ref 6–23)
CO2: 25 mEq/L (ref 19–32)
Chloride: 104 mEq/L (ref 96–112)
Creatinine, Ser: 0.79 mg/dL (ref 0.4–1.2)
Glucose, Bld: 224 mg/dL — ABNORMAL HIGH (ref 70–99)
Potassium: 4 mEq/L (ref 3.5–5.1)

## 2010-07-25 LAB — URINE MICROSCOPIC-ADD ON

## 2010-07-25 LAB — CARDIAC PANEL(CRET KIN+CKTOT+MB+TROPI)
Relative Index: INVALID (ref 0.0–2.5)
Total CK: 86 U/L (ref 7–177)

## 2010-07-25 LAB — URINALYSIS, ROUTINE W REFLEX MICROSCOPIC
Bilirubin Urine: NEGATIVE
Glucose, UA: >1000 mg/dL — AB
Ketones, ur: 15 mg/dL — AB
pH: 5.5 (ref 5.0–8.0)

## 2010-07-25 LAB — DIFFERENTIAL
Basophils Relative: 0 % (ref 0–1)
Eosinophils Absolute: 0.2 10*3/uL (ref 0.0–0.7)
Eosinophils Relative: 1 % (ref 0–5)
Lymphs Abs: 3.6 10*3/uL (ref 0.7–4.0)

## 2010-07-25 LAB — TSH: TSH: 2.055 u[IU]/mL (ref 0.350–4.500)

## 2010-07-25 LAB — PROTIME-INR: INR: 1.07 (ref 0.00–1.49)

## 2010-07-25 LAB — APTT: aPTT: 29 seconds (ref 24–37)

## 2010-07-25 LAB — D-DIMER, QUANTITATIVE: D-Dimer, Quant: <0.22 ug/mL-FEU (ref 0.00–0.48)

## 2010-07-26 LAB — GLUCOSE, CAPILLARY
Glucose-Capillary: 287 mg/dL — ABNORMAL HIGH (ref 70–99)
Glucose-Capillary: 460 mg/dL — ABNORMAL HIGH (ref 70–99)
Glucose-Capillary: 547 mg/dL (ref 70–99)

## 2010-07-26 LAB — DIFFERENTIAL
Basophils Absolute: 0.1 10*3/uL (ref 0.0–0.1)
Lymphocytes Relative: 29 % (ref 12–46)
Lymphs Abs: 3.5 10*3/uL (ref 0.7–4.0)
Monocytes Absolute: 0.5 10*3/uL (ref 0.1–1.0)
Monocytes Relative: 5 % (ref 3–12)
Neutro Abs: 7.8 10*3/uL — ABNORMAL HIGH (ref 1.7–7.7)

## 2010-07-26 LAB — POCT CARDIAC MARKERS
Myoglobin, poc: 67.7 ng/mL (ref 12–200)
Troponin i, poc: <0.05 ng/mL (ref 0.00–0.09)

## 2010-07-26 LAB — POCT I-STAT, CHEM 8
BUN: 11 mg/dL (ref 6–23)
Creatinine, Ser: 0.3 mg/dL — ABNORMAL LOW (ref 0.4–1.2)
Glucose, Bld: 548 mg/dL (ref 70–99)
Sodium: 131 mEq/L — ABNORMAL LOW (ref 135–145)
TCO2: 26 mmol/L (ref 0–100)

## 2010-07-26 LAB — URINE CULTURE

## 2010-07-26 LAB — URINALYSIS, ROUTINE W REFLEX MICROSCOPIC
Bilirubin Urine: NEGATIVE
Glucose, UA: >1000 mg/dL — AB
Nitrite: NEGATIVE
Specific Gravity, Urine: 1.037 — ABNORMAL HIGH (ref 1.005–1.030)
pH: 5.5 (ref 5.0–8.0)

## 2010-07-26 LAB — URINE MICROSCOPIC-ADD ON

## 2010-07-26 LAB — CBC
HCT: 38.7 % (ref 36.0–46.0)
Hemoglobin: 12.6 g/dL (ref 12.0–15.0)
RBC: 5.25 MIL/uL — ABNORMAL HIGH (ref 3.87–5.11)

## 2010-07-30 LAB — GLUCOSE, CAPILLARY: Glucose-Capillary: 597 mg/dL (ref 70–99)

## 2010-07-31 LAB — GLUCOSE, CAPILLARY: Glucose-Capillary: 516 mg/dL (ref 70–99)

## 2010-08-01 LAB — POCT I-STAT, CHEM 8
BUN: 5 mg/dL — ABNORMAL LOW (ref 6–23)
Calcium, Ion: 1.14 mmol/L (ref 1.12–1.32)
Chloride: 100 mEq/L (ref 96–112)
Glucose, Bld: 276 mg/dL — ABNORMAL HIGH (ref 70–99)

## 2010-08-03 LAB — GLUCOSE, CAPILLARY: Glucose-Capillary: 533 mg/dL (ref 70–99)

## 2010-08-04 ENCOUNTER — Ambulatory Visit: Payer: Medicaid Other | Admitting: Internal Medicine

## 2010-08-08 LAB — URINALYSIS, ROUTINE W REFLEX MICROSCOPIC
Glucose, UA: >1000 mg/dL — AB
Ketones, ur: NEGATIVE mg/dL
Nitrite: NEGATIVE
Protein, ur: NEGATIVE mg/dL

## 2010-08-08 LAB — CBC
HCT: 36.7 % (ref 36.0–46.0)
MCHC: 32.1 g/dL (ref 30.0–36.0)
MCV: 68.7 fL — ABNORMAL LOW (ref 78.0–100.0)
RBC: 5.35 MIL/uL — ABNORMAL HIGH (ref 3.87–5.11)
WBC: 12.7 10*3/uL — ABNORMAL HIGH (ref 4.0–10.5)

## 2010-08-08 LAB — POCT I-STAT, CHEM 8
Calcium, Ion: 1.22 mmol/L (ref 1.12–1.32)
Creatinine, Ser: 1 mg/dL (ref 0.4–1.2)
Glucose, Bld: >700 mg/dL (ref 70–99)
Hemoglobin: 14.3 g/dL (ref 12.0–15.0)
TCO2: 22 mmol/L (ref 0–100)

## 2010-08-08 LAB — BLOOD GAS, ARTERIAL
Bicarbonate: 21.2 mEq/L (ref 20.0–24.0)
Patient temperature: 98.2
TCO2: 19.5 mmol/L (ref 0–100)
pH, Arterial: 7.402 — ABNORMAL HIGH (ref 7.350–7.400)

## 2010-08-08 LAB — BASIC METABOLIC PANEL
BUN: 10 mg/dL (ref 6–23)
CO2: 22 mEq/L (ref 19–32)
Calcium: 8.8 mg/dL (ref 8.4–10.5)
Glucose, Bld: 836 mg/dL (ref 70–99)
Sodium: 125 mEq/L — ABNORMAL LOW (ref 135–145)

## 2010-08-08 LAB — GLUCOSE, CAPILLARY
Glucose-Capillary: 436 mg/dL — ABNORMAL HIGH (ref 70–99)
Glucose-Capillary: 535 mg/dL (ref 70–99)

## 2010-08-08 LAB — DIFFERENTIAL
Eosinophils Absolute: 0.1 10*3/uL (ref 0.0–0.7)
Eosinophils Relative: 1 % (ref 0–5)
Lymphs Abs: 3.1 10*3/uL (ref 0.7–4.0)
Monocytes Absolute: 0.9 10*3/uL (ref 0.1–1.0)
Monocytes Relative: 7 % (ref 3–12)

## 2010-08-15 ENCOUNTER — Emergency Department (HOSPITAL_COMMUNITY)
Admission: EM | Admit: 2010-08-15 | Discharge: 2010-08-16 | Disposition: A | Discharge status: Home / Self Care | Payer: Medicaid Other | Attending: Emergency Medicine | Admitting: Emergency Medicine

## 2010-08-15 DIAGNOSIS — R112 Nausea with vomiting, unspecified: Secondary | ICD-10-CM | POA: Insufficient documentation

## 2010-08-15 DIAGNOSIS — R109 Unspecified abdominal pain: Secondary | ICD-10-CM | POA: Insufficient documentation

## 2010-08-15 DIAGNOSIS — Z8619 Personal history of other infectious and parasitic diseases: Secondary | ICD-10-CM | POA: Insufficient documentation

## 2010-08-15 DIAGNOSIS — E119 Type 2 diabetes mellitus without complications: Secondary | ICD-10-CM | POA: Insufficient documentation

## 2010-08-15 DIAGNOSIS — I2510 Atherosclerotic heart disease of native coronary artery without angina pectoris: Secondary | ICD-10-CM | POA: Insufficient documentation

## 2010-08-15 DIAGNOSIS — I252 Old myocardial infarction: Secondary | ICD-10-CM | POA: Insufficient documentation

## 2010-08-15 DIAGNOSIS — R10812 Left upper quadrant abdominal tenderness: Secondary | ICD-10-CM | POA: Insufficient documentation

## 2010-08-15 DIAGNOSIS — I10 Essential (primary) hypertension: Secondary | ICD-10-CM | POA: Insufficient documentation

## 2010-08-16 LAB — DIFFERENTIAL
Lymphocytes Relative: 16 % (ref 12–46)
Lymphs Abs: 3.1 10*3/uL (ref 0.7–4.0)
Neutrophils Relative %: 77 % (ref 43–77)

## 2010-08-16 LAB — URINALYSIS, ROUTINE W REFLEX MICROSCOPIC
Bilirubin Urine: NEGATIVE
Glucose, UA: NEGATIVE mg/dL
Nitrite: NEGATIVE
Protein, ur: 100 mg/dL — AB

## 2010-08-16 LAB — URINE MICROSCOPIC-ADD ON

## 2010-08-16 LAB — COMPREHENSIVE METABOLIC PANEL
Alkaline Phosphatase: 65 U/L (ref 39–117)
BUN: 13 mg/dL (ref 6–23)
Chloride: 98 mEq/L (ref 96–112)
Glucose, Bld: 194 mg/dL — ABNORMAL HIGH (ref 70–99)
Potassium: 3.4 mEq/L — ABNORMAL LOW (ref 3.5–5.1)
Total Bilirubin: 0.9 mg/dL (ref 0.3–1.2)

## 2010-08-16 LAB — CBC
HCT: 32.5 % — ABNORMAL LOW (ref 36.0–46.0)
Hemoglobin: 10.4 g/dL — ABNORMAL LOW (ref 12.0–15.0)
MCV: 62.6 fL — ABNORMAL LOW (ref 78.0–100.0)
RBC: 5.19 MIL/uL — ABNORMAL HIGH (ref 3.87–5.11)
WBC: 20.2 10*3/uL — ABNORMAL HIGH (ref 4.0–10.5)

## 2010-08-16 LAB — GLUCOSE, CAPILLARY: Glucose-Capillary: 181 mg/dL — ABNORMAL HIGH (ref 70–99)

## 2010-08-16 LAB — LIPASE, BLOOD: Lipase: 24 U/L (ref 11–59)

## 2010-08-16 LAB — PREGNANCY, URINE: Preg Test, Ur: NEGATIVE

## 2010-08-17 LAB — URINE CULTURE

## 2010-08-18 NOTE — Discharge Summary (Signed)
Shirley [name], SYLVAN              ACCOUNT NO.:  [id number]  MEDICAL RECORD NO.:  [id number]           PATIENT TYPE:  I  LOCATION:  5524                         FACILITY:  [facility]  PHYSICIAN:  Edsel Petrin, D.O.DATE OF BIRTH:  1975/09/25  DATE OF ADMISSION:  07/18/2010 DATE OF DISCHARGE:  07/21/2010                              DISCHARGE SUMMARY   DISCHARGE DIAGNOSES: 1. Intractable nausea and vomiting of unknown etiology. 2. Coronary artery disease. 3. Chronic anemia. 4. Type 2 diabetes. 5. Depression. 6. Abnormal urinalysis.  DISCHARGE MEDICATIONS: 1. Clonazepam 0.5 mg tabs take 1 tablet by mouth daily. 2. Clopidogrel 75 mg tabs take 1 tablet by mouth daily. 3. Ferrous gluconate 324 mg tabs take 1 tablet by mouth twice daily. 4. Lantus 15 units injected subcutaneously daily at bedtime. 5. Metoprolol 25 mg tabs take 1 tablet by mouth twice daily. 6. Protonix 40 mg tabs take 1 tablet by mouth twice daily with meals. 7. Lyrica 75 mg capsules take 1 tablet by mouth daily. 8. Promethazine 12.5 mg tabs take 1 tablet by mouth every 6 hours as     needed. 9. Zofran 4 mg tabs take 1 tablet by mouth every 4 hours as needed.  DISPOSITION AND FOLLOWUP:  The patient is scheduled to follow up with Dr. Sherryll Burger at the Outpatient Clinic in [place] on August 04, 2010, at 11:15 a.m.  At that visit, the patient's volume status should be evaluated to ensure that the patient is getting appropriate p.o. intake, I would also follow up with a CBC to ensure that the patient's hemoglobin has remained stable as it was 8.8 on discharge, which was likely secondary to significant volume administration.  Although the patient has violated a pain contract in the past and thus the [facility] has decided not to provide her with controlled substances, per the request of Dr. Phillips Odor I have started clonazepam 0.5 mg tabs daily to try to help with the patient's nausea as there may be a significant  anxiety component to this.  This is only for the short term and the patient is aware of this. The patient is scheduled to follow up at [facility] with Dr. Russella Dar on July 28, 2010, at 2:30 p.m.  At this point in time, the patient will likely need a repeat gastric emptying study as she is highly likely to have gastroparesis as an underlying cause for her nausea and vomiting. At any rate, she will need further workup and possible repeat endoscopy at that time.  PROCEDURES PERFORMED:  One-view abdominal x-ray was performed on March 27 demonstrated nonobstructive bowel gas pattern without bowel dilation or bowel wall thickening.  CONSULTATIONS:  None.  BRIEF ADMITTING HISTORY AND PHYSICAL:  This is a 34-year female with past medical history significant for uncontrolled type 2 diabetes, asthma, history of anemia, who presents with a 71-month history of nausea and vomiting.  The onset was insidious and the patient states that she vomits several times daily, 8 times on the day of admission.  The patient was last seen in December, but the GI refused to consult as they felt that her symptoms were  consistent with gastroparesis and previous gastric emptying study was felt to be falsely negative as the patient had been on Reglan.  They recommended the patient's Reglan be discontinued for 1 month and she will be further evaluated, but the patient continued to not follow up in their clinic.  The patient had an EGD in November which demonstrated H. pylori and mild gastritis for which she has completed 2 rounds of triple therapy.  Biopsy of the ileum was negative for celiac disease.  The patient also had a workup of abdominal CT scan, transabdominal ultrasound all of which had been unrevealing.  With regards to the patient's pain is described as crampy, constant, 10/10 severity, worsened by vomiting.  The patient states that p.o. intake causes vomiting, she does eats small meals most of which  are clear liquids.  She denies sick contacts, fevers, chills, change in bowel movements but she does complain of occasional chronic constipation.  ALLERGIES:  LISINOPRIL.  PAST MEDICAL HISTORY:  Coronary artery disease, asthma, migraines, type 2 diabetes, hypertension, hepatitis B, morbid obesity, history of irregular periods, history of group B pyelonephritis, depression, vaginal abscess.  VITAL SIGNS:  Admitting vital signs:  Temperature 98.3, heart rate 121, blood pressure 139/100. Orthostatic vital signs:  Blood pressure 168/120, heart rate 93, standing 139/100, heart rate 121.  PHYSICAL EXAMINATION:  GENERAL:  Alert, well developed, cooperative to exam. HEAD:  Normocephalic, atraumatic. EYES:  Vision grossly intact.  Pupils are equal, round and reactive to light. MOUTH:  Oropharynx pink and moist.  No erythema or exudates. NECK:  Supple, full range of motion.  No JVD, thyromegaly, or carotid bruits. LUNGS:  Bilaterally clear to auscultation.  No crackles, wheezes or rubs. HEART:  Regular rate and rhythm.  No murmurs, gallops or rubs. ABDOMEN:  Soft, nontender, normal bowel sounds.  There was mild tenderness to palpation of the left lower quadrant. MUSCULOSKELETAL:  No joint pain, warmth or redness of her joints. PULSES:  2+ DP/PT pulses bilaterally. EXTREMITIES:  No cyanosis, clubbing or edema. NEUROLOGIC:  Nonfocal.  Labs on admission, CBG 160.  HOSPITAL COURSE BY PROBLEM: 1. Intractable nausea and vomiting:  This was felt by GI who has     evaluated this patient in the past to be secondary to     gastroparesis.  This has been more likely given that the patient     has had numerous A1cs greater than 14 in the past.  The patient     does have a negative gastric emptying study performed back in     October, however, this was felt to be falsely negative given the     fact that she was on Reglan near that time.  GI has requested that     the patient not be seen as an  inpatient referral as she has missed     multiple appointments in the past and there is nothing further they     feel a need to add at this time.  The patient has been scheduled     for an outpatient appointment with Dr. Russella Dar of [facility] for     further workup of this problem.  As mentioned previously, the     etiology remained unclear, however, the patient's vital signs which     were orthostatic in the clinic, has now resolved, and her metabolic     panel continues to be within normal limits which makes me question     whether or not the patient truly  is vomiting gastric contents as I     would expect her to have a metabolic alkalosis and hypochloremia.     That being said, I am less concerned that this may be functional in     etiology and may be the results of anxiety or other psychologic     issues.  As such we decided during this hospitalization to start     the patient on low-dose clonazepam to try to assist with this.  The     patient's nausea improved upon initiation of this therapy, and the     patient was advanced to a liquid diet with instructions to only     gradually increase her diet at home maintaining a gastroparesis     diet until her followup appointment.  I will continue the patient     on Zofran and Phenergan and though she does continue to have     abdominal pain, I will be discontinuing her Toradol as well as her     Neurontin given that both of these can increase a person's     propensity for nausea. 2. Coronary artery disease:  The patient is status post subendocardial     MI with PA angioplasty in February 2008 with a relook cath in     February 2008 which showed patency of the side.  [name] cath     in December 2010 demonstrated no restenosis or significant coronary     artery disease progression.  We will continue the patient's Plavix     at this time and continue to manage her risk factors. 3. Type 2 diabetes:  The patient has had significantly  uncontrolled     diabetes in the past with numerous hemoglobin A1cs greater than 14,     however, hemoglobin A1c was 7.8 at this time.  The patient was     recently had her Lantus dose decreased to 15 which I will maintain     at the time of her discharge.  Although the patient had a few blood     sugars that were low in the high 80s, she was n.p.o. and now that     she is tolerating a liquid diet, we will continue her on a higher     dose of Lantus to avoid loss of glycemic control. 4. Chronic anemia:  This has been worked up by Hematology/Oncology in     the past and is felt to be secondary to her iron deficiency.  It is     also felt that she may have hemoglobin see, however, the patient     has never had a hemoglobin electrophoresis performed.  At the time     of admission, the patient's hemoglobin was 10.0 just close to her     baseline.  She was discharged with a hemoglobin of 8.8 which was     felt to be secondary to volume administration.  At followup, I     would follow up with a CBC to ensure that she has had no further     drop in her hemoglobin, at present there are no obvious sources of     bleeding. 5. Abnormal urinalysis:  At the time of her admission, the patient's     UA demonstrated large amounts of blood but micro only demonstrated     to 0-2 rbcs.  As a result, repeat urinalysis was performed on the second day of admission which did not demonstrate any  blood in her     urine, only demonstrated increased ketones likely secondary to the     patient having been placed on an n.p.o. diet.  At this point I do     not feel that further workup is required.  DISCHARGE VITAL SIGNS:  On the day of discharge, the patient's temperature was 98.4, blood pressure 121/79, heart rate 89, respirations 12 and she was satting 95% on room air.     Sinda Du, MD   ______________________________ Edsel Petrin, D.O.    BB/MEDQ  D:  07/21/2010  T:  07/22/2010  Job:   [id number]  cc:   [facility]  Electronically Signed by Sinda Du MD on 08/06/2010 08:14:18 PM Electronically Signed by Anderson Malta D.O. on 08/18/2010 09:25:57 PM

## 2010-08-28 ENCOUNTER — Emergency Department (HOSPITAL_COMMUNITY)
Admission: EM | Admit: 2010-08-28 | Discharge: 2010-08-28 | Disposition: A | Discharge status: Home / Self Care | Payer: Medicaid Other | Attending: Emergency Medicine | Admitting: Emergency Medicine

## 2010-08-28 ENCOUNTER — Emergency Department (HOSPITAL_COMMUNITY): Payer: Medicaid Other

## 2010-08-28 DIAGNOSIS — R1032 Left lower quadrant pain: Secondary | ICD-10-CM | POA: Insufficient documentation

## 2010-08-28 DIAGNOSIS — R42 Dizziness and giddiness: Secondary | ICD-10-CM | POA: Insufficient documentation

## 2010-08-28 DIAGNOSIS — J45909 Unspecified asthma, uncomplicated: Secondary | ICD-10-CM | POA: Insufficient documentation

## 2010-08-28 DIAGNOSIS — Z794 Long term (current) use of insulin: Secondary | ICD-10-CM | POA: Insufficient documentation

## 2010-08-28 DIAGNOSIS — I10 Essential (primary) hypertension: Secondary | ICD-10-CM | POA: Insufficient documentation

## 2010-08-28 DIAGNOSIS — E119 Type 2 diabetes mellitus without complications: Secondary | ICD-10-CM | POA: Insufficient documentation

## 2010-08-28 DIAGNOSIS — I2510 Atherosclerotic heart disease of native coronary artery without angina pectoris: Secondary | ICD-10-CM | POA: Insufficient documentation

## 2010-08-28 DIAGNOSIS — R112 Nausea with vomiting, unspecified: Secondary | ICD-10-CM | POA: Insufficient documentation

## 2010-08-28 DIAGNOSIS — D649 Anemia, unspecified: Secondary | ICD-10-CM | POA: Insufficient documentation

## 2010-08-28 DIAGNOSIS — IMO0001 Reserved for inherently not codable concepts without codable children: Secondary | ICD-10-CM | POA: Insufficient documentation

## 2010-08-28 LAB — POCT I-STAT, CHEM 8
BUN: 11 mg/dL (ref 6–23)
Chloride: 102 mEq/L (ref 96–112)
Creatinine, Ser: 0.6 mg/dL (ref 0.4–1.2)
Glucose, Bld: 208 mg/dL — ABNORMAL HIGH (ref 70–99)
Hemoglobin: 11.2 g/dL — ABNORMAL LOW (ref 12.0–15.0)
Potassium: 3.7 mEq/L (ref 3.5–5.1)

## 2010-08-28 LAB — CBC
HCT: 29.3 % — ABNORMAL LOW (ref 36.0–46.0)
Hemoglobin: 9.3 g/dL — ABNORMAL LOW (ref 12.0–15.0)
MCH: 19.7 pg — ABNORMAL LOW (ref 26.0–34.0)
MCHC: 31.7 g/dL (ref 30.0–36.0)
MCV: 62.1 fL — ABNORMAL LOW (ref 78.0–100.0)
RBC: 4.72 MIL/uL (ref 3.87–5.11)

## 2010-08-28 LAB — DIFFERENTIAL
Basophils Relative: 0 % (ref 0–1)
Eosinophils Relative: 0 % (ref 0–5)
Lymphocytes Relative: 11 % — ABNORMAL LOW (ref 12–46)
Monocytes Relative: 4 % (ref 3–12)
Neutro Abs: 15.9 10*3/uL — ABNORMAL HIGH (ref 1.7–7.7)
Neutrophils Relative %: 85 % — ABNORMAL HIGH (ref 43–77)

## 2010-08-28 LAB — HEPATIC FUNCTION PANEL
ALT: 5 U/L (ref 0–35)
Alkaline Phosphatase: 70 U/L (ref 39–117)
Indirect Bilirubin: 0.3 mg/dL (ref 0.3–0.9)
Total Bilirubin: 0.4 mg/dL (ref 0.3–1.2)

## 2010-08-28 LAB — LIPASE, BLOOD: Lipase: 31 U/L (ref 11–59)

## 2010-08-28 LAB — URINALYSIS, ROUTINE W REFLEX MICROSCOPIC
Bilirubin Urine: NEGATIVE
Nitrite: NEGATIVE
Protein, ur: NEGATIVE mg/dL
Specific Gravity, Urine: 1.019 (ref 1.005–1.030)
Urobilinogen, UA: 1 mg/dL (ref 0.0–1.0)

## 2010-08-28 LAB — PREGNANCY, URINE: Preg Test, Ur: NEGATIVE

## 2010-08-28 MED ORDER — IOHEXOL 300 MG/ML  SOLN
120.0000 mL | Freq: Once | INTRAMUSCULAR | Status: AC | PRN
  Administered 2010-08-28: 120 mL via INTRAVENOUS

## 2010-08-29 ENCOUNTER — Encounter: Payer: Self-pay | Admitting: Gastroenterology

## 2010-08-29 ENCOUNTER — Ambulatory Visit: Payer: Medicaid Other | Admitting: Gastroenterology

## 2010-08-29 NOTE — Progress Notes (Signed)
Patient was a no show today for office visit with Dr Russella Dar.  Patient has not been seen by [facility] in the office, only as a hospital patient .  She has been non compliant with follow up and has no showed today.  She should not be rescheduled with Dr Russella Dar per Dr Russella Dar.

## 2010-09-03 ENCOUNTER — Encounter: Payer: Self-pay | Admitting: Internal Medicine

## 2010-09-05 ENCOUNTER — Emergency Department (HOSPITAL_COMMUNITY)
Admission: EM | Admit: 2010-09-05 | Discharge: 2010-09-05 | Disposition: A | Discharge status: Home / Self Care | Payer: Medicaid Other | Attending: Emergency Medicine | Admitting: Emergency Medicine

## 2010-09-05 DIAGNOSIS — I10 Essential (primary) hypertension: Secondary | ICD-10-CM | POA: Insufficient documentation

## 2010-09-05 DIAGNOSIS — G8929 Other chronic pain: Secondary | ICD-10-CM | POA: Insufficient documentation

## 2010-09-05 DIAGNOSIS — I252 Old myocardial infarction: Secondary | ICD-10-CM | POA: Insufficient documentation

## 2010-09-05 DIAGNOSIS — Z8619 Personal history of other infectious and parasitic diseases: Secondary | ICD-10-CM | POA: Insufficient documentation

## 2010-09-05 DIAGNOSIS — I2510 Atherosclerotic heart disease of native coronary artery without angina pectoris: Secondary | ICD-10-CM | POA: Insufficient documentation

## 2010-09-05 DIAGNOSIS — R1012 Left upper quadrant pain: Secondary | ICD-10-CM | POA: Insufficient documentation

## 2010-09-05 DIAGNOSIS — R112 Nausea with vomiting, unspecified: Secondary | ICD-10-CM | POA: Insufficient documentation

## 2010-09-05 DIAGNOSIS — E119 Type 2 diabetes mellitus without complications: Secondary | ICD-10-CM | POA: Insufficient documentation

## 2010-09-05 LAB — URINE MICROSCOPIC-ADD ON

## 2010-09-05 LAB — CBC
HCT: 31.4 % — ABNORMAL LOW (ref 36.0–46.0)
MCH: 19.9 pg — ABNORMAL LOW (ref 26.0–34.0)
MCV: 62.5 fL — ABNORMAL LOW (ref 78.0–100.0)
Platelets: 451 10*3/uL — ABNORMAL HIGH (ref 150–400)
RBC: 5.02 MIL/uL (ref 3.87–5.11)
RDW: 18.7 % — ABNORMAL HIGH (ref 11.5–15.5)
WBC: 14.1 10*3/uL — ABNORMAL HIGH (ref 4.0–10.5)

## 2010-09-05 LAB — COMPREHENSIVE METABOLIC PANEL
Albumin: 3.4 g/dL — ABNORMAL LOW (ref 3.5–5.2)
Alkaline Phosphatase: 64 U/L (ref 39–117)
BUN: 12 mg/dL (ref 6–23)
CO2: 23 mEq/L (ref 19–32)
Chloride: 102 mEq/L (ref 96–112)
Creatinine, Ser: 0.71 mg/dL (ref 0.4–1.2)
GFR calc non Af Amer: >60 mL/min (ref >60)
Glucose, Bld: 248 mg/dL — ABNORMAL HIGH (ref 70–99)
Total Bilirubin: 0.2 mg/dL — ABNORMAL LOW (ref 0.3–1.2)

## 2010-09-05 LAB — URINALYSIS, ROUTINE W REFLEX MICROSCOPIC
Ketones, ur: NEGATIVE mg/dL
Leukocytes, UA: NEGATIVE
Nitrite: NEGATIVE
Protein, ur: NEGATIVE mg/dL
Urobilinogen, UA: 0.2 mg/dL (ref 0.0–1.0)

## 2010-09-05 LAB — DIFFERENTIAL
Basophils Relative: 0 % (ref 0–1)
Eosinophils Absolute: 0.3 10*3/uL (ref 0.0–0.7)
Eosinophils Relative: 2 % (ref 0–5)
Monocytes Absolute: 0.8 10*3/uL (ref 0.1–1.0)
Neutro Abs: 10.2 10*3/uL — ABNORMAL HIGH (ref 1.7–7.7)

## 2010-09-05 LAB — POCT PREGNANCY, URINE: Preg Test, Ur: NEGATIVE

## 2010-09-05 LAB — LIPASE, BLOOD: Lipase: 53 U/L (ref 11–59)

## 2010-09-06 NOTE — Procedures (Signed)
Shirley [name], Shirley [name]              ACCOUNT NO.:  [id number]   MEDICAL RECORD NO.:  [id number]          PATIENT TYPE:  OUT   LOCATION:  SLEEP CENTER                 FACILITY:  [facility]   PHYSICIAN:  Clinton D. Maple Hudson, MD, FCCP, FACPDATE OF BIRTH:  04/10/74   DATE OF STUDY:  02/04/2007                            NOCTURNAL POLYSOMNOGRAM   REFERRING PHYSICIAN:  Yetta Barre, M.D.   INDICATION FOR STUDY:  Insomnia with sleep apnea.   EPWORTH SLEEPINESS SCORE:  12/24, BMI 50.7, weight 324 pounds.   MEDICATIONS:  Listed and reviewed.  The diagnostic study on July 11, 2006, recorded an AHI of 31.8/hr.  CPAP titration is requested.   SLEEP ARCHITECTURE:  Total sleep time 277 minutes with sleep efficiency  78%.  Stage I was 3%, stage II 90%, stage III absent, REM 7% of total  sleep time.  Sleep latency 22 minutes, REM latency 228 minutes, awake  after sleep onset 57 minutes, arousal index 10.6.  Bedtime medication  included Ambien CR 6.25 mg as well as her regularly prescribed  medications.   RESPIRATORY DATA:  CPAP titration protocol.  CPAP was titrated to 14  CWP, AHI 0/hr.  A medium ResMed Mirage  Micro Nasal mask was used.  The  patient complained of nasal congestion and only tolerated a passover  humidifier.   OXYGEN DATA:  CPAP prevented snoring.  Oxygen saturation 97% on room  air.   CARDIAC DATA:  Normal sinus rhythm.   MOVEMENT-PARASOMNIA:  Occasional limb jerk with Dilorenzo affect on sleep.  Bathroom x1.   IMPRESSIONS-RECOMMENDATIONS:  1. Successful CPAP titration to 14 CWP, AHI 0/hr.  A ResMed Mirage      Micro Nasal Mask (medium) was used with a passover humidifier.  2. Baseline diagnostic NPSG on July 11, 2006, had recorded AHI of      31.8/hr.     Clinton D. Maple Hudson, MD, [facility], FACP  Diplomate, Biomedical engineer of Sleep Medicine  Electronically Signed    CDY/MEDQ  D:  02/10/2007 10:46:44  T:  02/11/2007 10:39:46  Job:  [id number]

## 2010-09-06 NOTE — Discharge Summary (Signed)
Shirley [name], Shirley [name]              ACCOUNT NO.:  [id number]   MEDICAL RECORD NO.:  [id number]          PATIENT TYPE:  INP   LOCATION:  3741                         FACILITY:  [facility]   PHYSICIAN:  Zara Council, MD      DATE OF BIRTH:  [birth year]   DATE OF ADMISSION:  05/07/2007  DATE OF DISCHARGE:  05/09/2007                               DISCHARGE SUMMARY   PRIMARY CARE PHYSICIAN:  Yetta Barre, M.D., at [facility].   DISCHARGE DIAGNOSES:  1. Chest pain, most likely stable angina, acute coronary syndrome      ruled out.  2. History of coronary artery disease with normal catheterization in      February 2008.  3. Status post angioplasty to posterior descending coronary artery.  4. Hypertension.  5. Childhood asthma.  6. Type 2 diabetes mellitus with A1c of 10.9.  7. Anxiety disorder.  8. Obstructive sleep apnea, on CPAP.  9. Depression.  10.Gastroesophageal reflux disease.  11.Microcytic anemia.  12.History of hepatitis B infection.  13.History of sepsis.  14.Migraine headaches.  15.Hyperlipidemia.  16.Lymphocytosis.  17.Orthostatic hypotension.  18.Hyponatremia.   DISCHARGE MEDICATIONS:  1. Aspirin 81 mg once daily.  2. Plavix 75 mg once daily.  3. Lopressor 25 mg 2 times a day.  4. Imdur 30 mg daily.  5. Avapro 300 mg once daily.  6. Lantus 60 units subcutaneous daily.  7. Advair 150 mcg 1 puff twice a day.  8. Singulair 10 mg daily.  9. Effexor XR 37.5 mg once daily.  10.Xanax 0.25 mg as required.  11.Protonix 40 mg once daily.  12.Vicodin 5/500 mg 1-2 tablets as required for pain up to 4 times a      day.  13.Ambien CR 6.25 mg once daily.  14.Relpax 20 mg as required for headache.  15.Zocor 10 mg once daily.  16.Nitroglycerin tablet sublingual 0.4 mg 1 tablet as required for      chest pain.   DISPOSITION AND FOLLOWUP:  The patient has been given following followup  appointments:  1. Outpatient echocardiography on May 15, 2007, at [facility].  2. [facility] followup appointment with Dr.      Liliane Channel on May 21, 2007, at 4 p.m.  3. Followup with Dr. Jacinto Halim at [facility] on May 17, 2007, at 8:45 in the morning.   At the followup visits, the patient needs to be reviewed for resolution  or progression of her chest pain.  From primary care point of view, she  is to be reviewed on her chronic problems of hypertension, diabetes, as  well as microcytic anemia.  She was noted to have orthostatic  hypotension in the hospital, and so metoprolol was reduced from 50 mg  twice a day to 25 mg.  Also, her insulin dose was increased from 55 to  60 units.  Her hemoglobin on discharge was 9.9.  It is recommended that  these be followed on an outpatient basis.   From cardiologic point of view, her cardiologist  is requested to review  her for chest pain and consider any intervention or medicine adjustments  she can benefit from.   PROCEDURES:  1. Chest x-ray May 07, 2007; impression:  No active      cardiopulmonary disease.  The heart size and mediastinal contours      were within normal limits.  Both lungs were clear.  The visualized      coronary structures were unremarkable.  2. CT head without contrast May 08, 2007; impression:  Negative      noncontrast CT head.  3. CT angiography of the chest; impression:  No acute abnormalities.      No evidence of pulmonary emboli or thoracic aortic dissection or      aneurysm.   CONSULTATIONS:  Cardiology [facility], Dr. Lynnea Ferrier.   ADMISSION HISTORY AND PHYSICAL:  Shirley [name] is a 35 year old African  American lady with a past medical history significant for type 2 DM,  coronary artery disease status post myocardial infarction, PTCA and  stenting, hypertension, morbid obesity, asthma, anxiety and depression  who presented to the ED with a 3-day history of chest pain and shortness  of  breath.  The patient reported she had a shooting pain that started in  the left leg and radiated up to the left arm and then went into her  chest.  She described the chest pain as pressure like, constant, waxing  and waning, 8/10 at worst and 4/10 when this history was taken.  Her  pain was associated with nausea and vomiting, about 7-8 of episodes, her  vomiting was nonbloody.  It was also associated with palpitations and  anxiety.  There were no aggravating or alleviating factors.  The patient  reported that her chest pain was similar to those she had had when she  had an MI.   PHYSICAL EXAMINATION:  VITAL SIGNS:  Temperature 98.3, blood pressure  136/87, pulse 116 regular, and respiratory rate 20.  O2 saturation was  97% on room air.  GENERAL:  Not in any apparent distress.  EYES:  Anicteric.  PERRLA.  EOMI.  ENT:  Moist mucous membranes, slightly dry.  NECK:  Supple.  No bruits.  CHEST:  Clear to auscultation bilaterally with good air movement.  CARDIOVASCULAR:  Regular tachycardia with no murmur, rubs, or gallops.  GASTROINTESTINAL:  Obese.  Positive bowel sounds.  Soft, nontender, and  nondistended abdomen.  EXTREMITIES:  Trace edema.  SKIN:  Dry, but no rashes.  MUSCULOSKELETAL:  No tenderness over joints or spine.  NEUROLOGIC:  Alert and oriented x3.  Cranial nerves II through XII  intact.  Strength 5/5 x4.  Sensation intact.  PSYCHIATRIC :  Appropriate.   ADMISSION LABORATORIES:  White cell 17,000, hemoglobin 11.3, MCV 66.3, D-  dimer 0.98, and platelets 434,000.  UDS positive for opiates.  Sodium  135, potassium 4, chloride 104, bicarbonate 23, BUN 8, creatinine 0.8,  and blood glucose 309.   HOSPITAL COURSE:  1. Chest pain:  The patient was admitted for further evaluation and      observation.  She was placed on our telemetry floor.  Her serial      cardiac enzymes were all normal, but she did have some nonspecific      T wave abnormality on her EKG.  We made a  consultation with the      cardiologist at the [facility].  She was seen by      Dr. Lynnea Ferrier who was of the opinion that  the patient's problem was      most likely not related to her heart, and outpatient followup was      advised.  The patient did have 1 episode of chest pain while in the      hospital, but that subsided with Morphine.  From cardiological      point of view, medical management was recommended and Zocor was      started for her hyperlipidemia.  We also ruled out all the possible      causes of chest pain, namely pulmonary embolism with a normal CT      angio.  2. Hypertension:  The patient was found to have postural tachycardia      with lying blood pressure 106/61 with heart rate of 97, sitting      blood pressure 133/91 with heart rate of 105, and the standing      blood pressure of 111/67 with heart rate of 121.  Her blood      pressure was running in the lower range so in addition to holding      the antihypertensive medication, she also received bolus fluids      following which her blood pressure came up.  Upon cardiologist's      recommendation, we have decreased her metoprolol from 50 mg twice a      day to 25 mg twice a day.  3. Diabetes:  Her CBGs were found to be in the high range with a very      high A1c of 10.9.  She was placed on Lantus as well as sliding sale      insulin.  On discharge, her Lantus was increased to 60 units from      55 units.  4. 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  The patient came in with a      hemoglobin of 11.3, and on discharge her hemoglobin was 9.9.  Her      baseline hemoglobin on the hospital system is about 10.  We think      this can be followed up on an outpatient basis, and iron      replacement can be considered.  5. Asthma:  The patient was placed on her regular medications  of      Advair and Singulair.  6. Depression/anxiety:  We continued her regular medication of Xanax      and Effexor.  7. Obstructive sleep apnea:  The patient was placed on CPAP.  8. Gastroesophageal reflux disease:  The patient was placed on PPI.   DISCHARGE DAY VITALS:  Temperature 98.1, blood pressure 104/63, pulse  100, and respiratory rate 18.  O2 saturation 96% on room air.   DISCHARGE DAY LABORATORIES:  Hemoglobin 9.9, white cell 10.7, and  platelet 414,000.  Sodium 131, potassium 4, chloride 102, bicarbonate  25, BUN 5, creatinine 0.93, and blood glucose 288.      Zara Council, MD  Electronically Signed     AS/MEDQ  D:  05/09/2007  T:  05/09/2007  Job:  [id number]   cc:   Yetta Barre, M.D.  Cristy Hilts. Jacinto Halim, MD

## 2010-09-06 NOTE — Consult Note (Signed)
NAMEMARCELLE, HEPNER              ACCOUNT NO.:  [id number]   MEDICAL RECORD NO.:  [id number]          PATIENT TYPE:  INP   LOCATION:  3018                         FACILITY:  [facility]   PHYSICIAN:  Antonietta Breach, M.D.  DATE OF BIRTH:  1975-12-23   DATE OF CONSULTATION:  12/12/2007  DATE OF DISCHARGE:                                 CONSULTATION   REASON FOR CONSULTATION:  Depression, anxiety, suicidal ideation.   REQUESTING PHYSICIAN:  [name], M.D.   HISTORY OF PRESENT ILLNESS:  Ms. Wakeman is a 35 year old female admitted  to the [facility] on December 11, 2007 for back pain.   Ms. Wiersma does acknowledge that she made an expression that she would  rather just go on and kill herself than to endure the pain that she has  been experiencing.   However, Ms. Oley is consistently explaining that was just a figure of  speech that was meant to communicate the severity of her pain.   She explains that suicide would never seriously enter her mind because  she has 2 children.  Also, she points out that she has a number of  interests and constructive future goals.   Ms. Fini does not have any thoughts of harming others.  She has no  delusions or hallucinations.  Her memory and orientation function are  intact.  She is socially appropriate and cooperative.   She does acknowledge that she has had several weeks, approximately 4, of  depressed mood, decreased energy, difficulty concentrating and partial  reduction of interest.  She also has been having insomnia.  She was  placed on Celexa which has not had any benefit,the Celexa is at 20 mg  daily.   Ms. Watters also has been receiving 0.25 mg of Xanax every evening for  insomnia which has not helped.   Ms. Backes does have regular attacks of anxiety which involve intense  feeling on edge.  She dreads them returning.   PAST PSYCHIATRIC HISTORY:  Ms. Scavone discusses her previous treatment  of depression.  In review of the  past medical record, it is confirmed in  September 2006 Ms. Perret was tried on Effexor and a low dose of low  dose of 37.5 mg q.a.m.  This with the dose that was continued for most  of the time over several months.   There was a period where her Effexor was discontinued during a general  medical admission.  It was then restarted at a dosage of 150 mg daily.  Ms. Blackham is unsure of how long the 150 mg dosage was taken.   Ms. Paff has no history of suicide attempts.  She has no history of  mania or psychiatric hospitalization.   FAMILY PSYCHIATRIC HISTORY:  None known.   SOCIAL HISTORY:  Ms. Colee is divorced.  She has 2 children.  She does  not use alcohol or illegal drugs.  She is a part-time a Personal assistant.   SOCIAL HISTORY:  Ms. Kurihara denies alcohol or illegal drugs.   PAST MEDICAL HISTORY:  1. Chronic back pain.  2. Diabetes mellitus.  3. Hypertension.  4. Coronary artery disease with a history of a myocardial infarction.  5. History of coronary artery stenting.  6. Fibromyalgia.   MEDICATIONS:  The MAR are is reviewed.  Ms. Lonzo is on Xanax 0.25 mg  q.h.s. and Celexa 20 mg daily.   LABORATORY DATA:  WBC 13.9, hemoglobin 10.4, platelet count 444.  Sodium  136, BUN 9, creatinine 0.68, glucose 265, SGOT 17, SGPT 8.  Ms. Trew  did have significant urine glucose at 250.   Alcohol, serum drug screen, TSH, magnesium and HCG were all  unremarkable.   REVIEW OF SYSTEMS:  Constitutional, head, eyes, ears, nose and throat,  mouth, neurologic, psychiatric cardiovascular, respiratory,  gastrointestinal, genitourinary, skin, musculoskeletal, hematologic  lymphatic, endocrine,metabolic all unremarkable.   EXAMINATION:  VITAL SIGNS:  Temperature 98.2, pulse 71, respiratory rate  18, blood pressure 127/76, O2 saturation on room air 100%.  GENERAL APPEARANCE:  Ms. Schiavi is a young female lying in a supine  position in her hospital bed with no abnormal  involuntary movements.   MENTAL STATUS EXAM:  Ms. Knight is alert, she is oriented to all  spheres.  Her eye contact is good.  Affect is anxious, mood is anxious,  memory is intact to immediate recent and remote.  Fund of knowledge and  intelligence within normal limits.  Speech involves normal rate and  prosody without dysarthria.  Thought process: Logical, coherent, goal-  directed.  No looseness of associations.  Thought content:  No thoughts  of harming herself, no thoughts of harming others, no delusions, no  hallucinations.  Insight is intact, judgment is intact.   ASSESSMENT:  AXIS I:  293.84: Anxiety disorder, not otherwise specified.  296.33:  Major depressive disorder, recurrent.  AXIS II:  None.  AXIS III:  See past medical history.  AXIS IV:  General medical, primary support group.  AXIS V:  55.   Ms. Holohan is not at risk to harm herself or others.  She agrees to call  emergency services immediately for any thoughts of harming herself,  thoughts of harming others or distress.   The undersigned provided ego supportive psychotherapy and education.   The indications, alternatives and adverse effects of Cymbalta and Xanax  were discussed with the patient including the risk of Xanax dependence.  She understands and would like to proceed as below.   RECOMMENDATIONS:  1. Would decrease the Celexa to 10 mg p.o. q.a.m. x2 days, then      discontinue.  2. Would start Cymbalta at 20 mg p.o. q.a.m. starting tomorrow, then      over 1 week would increase the Cymbalta to 30 mg p.o. b.i.d.  3. Would increase the patient's Xanax to 0.25 mg to 1 mg p.o. t.i.d.      p.r.n..  Ms. Wages agrees to not drive if drowsy.  4. Would ask the case manager to set Ms. Shahin with outpatient      psychiatric medication management as well as psychotherapy.   The psychotherapy suited for Ms. Swartout would be cognitive behavioral  therapy combined with deep breathing and progressive muscle  relaxation.  This will provide the greatest chance of Mrs. Colcord no longer needing  Xanax after several weeks.  Would have these set up as soon as possible  after discharge through the case manager.      Antonietta Breach, M.D.  Electronically Signed     JW/MEDQ  D:  12/12/2007  T:  12/12/2007  Job:  [id number]  cc:   Mick Sell, MD

## 2010-09-06 NOTE — Discharge Summary (Signed)
Shirley [name], Shirley [name]              ACCOUNT NO.:  [id number]   MEDICAL RECORD NO.:  [id number]          PATIENT TYPE:  INP   LOCATION:  3018                         FACILITY:  [facility]   PHYSICIAN:  Mick Sell, MD DATE OF BIRTH:  [birth year]   DATE OF ADMISSION:  12/11/2007  DATE OF DISCHARGE:  12/12/2007                               DISCHARGE SUMMARY   DISCHARGE DIAGNOSIS MAIN:  Suicidal ideation.   ACUTE DIAGNOSES:  1. Depression and anxiety.  2. Lower back pain.   CHRONIC DIAGNOSES:  1. Coronary artery disease with non-ST segment elevation myocardial      infarction in February 2008, status post percutaneous transluminal      coronary angioplasty of PDA.  2. Microcytic anemia with heterozygous hemoglobin C.  3. Hypertension.  4. Diabetes mellitus type 2, last hemoglobin A1c of 13.1 in July 2009.  5. Morbid obesity.  6. Obstructive sleep apnea.  7. Asthma.  8. Fibromyalgia.  9. Migraine headache.  10.History of hepatitis B.  11.Chronic venostasis.   DISCHARGE MEDICATIONS:  1. Cymbalta 20 mg p.o. daily.  2. Singulair 10 mg p.o. daily.  3. P.o. Plavix 75 mg daily.  4. P.o. Imdur 30 mg daily.  5. Irbesartan 300 mg daily.  6. P.o. aspirin 325 mg daily.  7. Advair 100/50 mg inhaler.  8. Lantus 65 mg nightly.  9. Relpax 20 mg as needed for migraines.  10.Xanax 0.25 mg p.o. q.8 h. as needed for anxiety attacks.  11.P.o. Protonix 40 mg p.o. daily.  12.Simvastatin 10 mg nightly.  13.Xopenex inhaler q.3 h. as needed for wheezing.  14.P.o. Lyrica 75 mg p.o. daily.  15.Ventolin inhaler.  16.P.o. Vicodin 5/325 take 1-2 tablets by mouth every 6 hours as      needed for pain.  17.P.o. ibuprofen 600 mg take 1 tablet every 8 hours for 3 days and      then every 8 hours as needed for pain.  18.P.o. metoprolol 25 mg b.i.d.   CONDITION AT DISCHARGE:  Stable.   The patient is to follow up in the outpatient clinic with Dr. Clent Ridges on  Monday, December 16, 2007, at 2:30 p.m.  She  will have a CBC and BMET  drawn at that time.   PROCEDURES:  The patient had an MRI of the lumbar spine and thoracic  spine on December 11, 2007, without contrast.   Findings were a shallow central protrusion of T7-T8 not compressive  otherwise negative for the thoracic MRI.  For the lumbar MRI, the  findings were as follows:  Mild 2 level disk disease at L4-L5 and L5-S1  not compressive.  Lower lumbar facet arthropathy.   CONSULTATIONS:  Due to the patient's suicidal ideation Dr. Jeanie Sewer,  psychiatry was consulted.   ADMISSION HISTORY AND PHYSICAL:  The patient is a 35 year old female  with NSTEMI in February 2008, diabetes mellitus type 2 last hemoglobin  A1c of 13.1, fibromyalgia, hypertension, depression and anxiety who  presents with severe sharp constant 10/10 right lower back/lower mid  back pain x1 week.  The patient was seen in an Outpatient Clinic on day  of admission said that she had suicidal thoughts and a plan to jump in  front of a car.  No previous suicide attempts.  No previous suicidal  ideation.  Her suicidal ideation was secondary to the extreme back pain,  and she later said that her suicidal ideation was out of frustration.  Her back pain was initially abdominal pain that was evaluated in ED on  November 19, 2007, with CT of the abdomen and pelvis and ultrasound of the  transvaginal all of which were negative.  Pain migrated first to her  right flank it is now on the right lower back/midline back.  Pain is  alleviated with Vicodin, but not Ultram and worse with sitting.  Denies  fevers or chills.  Denies any radiation of her back pain down legs.  No  trauma to her lower back, and the patient denies any incontinence or  saddle anesthesia.   PHYSICAL EXAMINATION:  VITAL SIGNS:  Afebrile.  97.5, blood pressure is  135/93, and pulse of 95.  [name] weight is 301 pounds.  GENERAL:  No apparent distress.  Lying in bed comfortably.  HEENT:  Moist membranous mucosa.   Pink oropharynx.  CARDIOVASCULAR:  Normal S1 and S2.  Regular rate and rhythm.  No  murmurs, rubs, or gallops.  GI:  Soft, nontender, and nondistended.  Positive bowel sounds.  RESPIRATORY:  Clear to auscultation bilaterally.  EXTREMITIES:  No cyanosis, clubbing, or edema.  NEUROLOGIC:  Cranial nerves II through XII intact.  Grossly nonfocal.  No saddle anesthesia.  Tender to palpation in lower back worse on right.  Straight leg test negative for pain shooting down her legs.  Although,  she did have pain in her lower back upon rising both legs.  PSYCHIATRIC:  Normal affect, exhibits normal thought processes.  Normal  conversational abilities.   Her initial CBC had a white blood cell count of 13.9, hemoglobin of  10.4, hematocrit of 32.6, MCV 67.4, and platelet count of 444.  Her  initial CMET had a sodium of 136, potassium of 3.5, chloride of 103,  bicarbonate 25, glucose of 265, BUN of 9, creatinine of 0.68, total  bilirubin 0.6, alk phosphatase 64, AST of 17, ALT of 8, total protein of  7.3, total albumin of 3.0, and calcium of 9.3.  Her UA had 250 glucose,  hazy appearance otherwise negative.  Her beta hCG was negative.  Her  ethyl alcohol was less than 5.  Her TSH was 1.222, her ESR was 45, her C-  reactive protein was 1.3, and her EDS was negative.   HOSPITAL COURSE:  1. Suicidal ideation:  Upon presentation she made comment during her      office visit to the outpatient clinic that she had thoughts of      suicide and had a thought of jumping in front of a car.  For this      reason, she was admitted with a sitter and put on suicide      precautions.  She has a history of anxiety and depression and is on      Celexa/Xanax p.r.n.  We spoke to the patient, and it seems like her      suicide comment was out of frustration because of the severity of      her back pain.  It did not appear that she had any actual suicidal      ideations and said that she had a lot to live for with  her 2      children.  We consulted psychiatry which Dr. Jeanie Sewer who agreed      that she was not a suicidal risk.  However, she was moderately to      severely depressed, and he recommended that she be switched over to      Cymbalta 20 mg daily which we did.  This may also be helpful for      her fibromyalgia.  We will continue her on her Xanax p.r.n. for      anxiety attacks.  2. Lower back pain:  Given her tenderness to palpation, lack of a      white blood cell count, lack of a fever we thought that it was most      likely musculoskeletal related.  She did have an elevated ESR and C-      reactive protein with her ESR being 45 and C-reactive protein being      1.3, so rheumatologic etiologies were possible.  We decided to      obtain a MRI of the lumbar and thoracic regions to assess for any      etiology for her back pain and to rule out cauda equina/spinal cord      compression/nerve root impingement.  As stated above, there was no      spinal cord comprise, although she did have 3 disks that were      mildly herniated.  She also has facet arthropathy.  The results of      the MRI were reviewed with the radiologist, and he believe that the      facet arthropathy and herniated disk were most likely related to      the patient's body habitus.  We treated her with Vicodin as an      inpatient and also tried Toradol.  The Toradol provided no      symptomatic relief, although the Vicodin did help some.  Due to the      likely inflammation, she was prescribed ibuprofen and prophylactic      Protonix as an outpatient.  She was given 600 mg t.i.d. of      ibuprofen.  She was also prescribed Vicodin 10/325 mg 1-2 tablets      as needed to q. 6 hours for pain relief.  We will follow her back      pain in clinic with a followup appointment scheduled for Monday,      December 16, 2007.  3. Diabetes mellitus type 2:  Her last hemoglobin A1c was 13.1.  We      continued her home Lantus and  provided her with sliding scale      coverage and QAC coverage.  Her diabetes will be followed as an      outpatient.  4. Vaginal discharge:  On November 19, 2007, wet prep, she had rare clue      cells.  We started her on Flagyl 500 mg b.i.d. on the day of      admission because of continued discharge.  She had a negative      gonorrhea and chlamydia on November 19, 2007.  5. Coronary artery disease with history of myocardial infarction:  She      has a significant cardiac risk given her obesity, her diabetes, and      her hypertension.  She has no active chest pain.  She did not have      an echo since her catheterization  in February 2008 so an echo was      repeated during this hospitalization.  The ECHO showed a LVEF of 55-      60%  with increased left ventricular wall thickness and a mildly      dilated left atrium.  6. Hypertension, stable on admission, we continued her home      medications including her beta blocker, her Angiotensin receptor      blocker, and her Imdur.  7. Fibromyalgia:  Continue Lyrica.  We switched her from Celexa to      Cymbalta and hopefully this will help.  8. Asthma:  Continue home Advair/albuterol.  9. Microcytic anemia:  She had a hemoglobin of 10.4 and with a 11.2      during her last admission in July 2009.  Her MCV was 67.4.  She      will be continued on her iron sulfate supplement as an outpatient.  10.Pending labs.  There are no pending labs at this time.      Linward Foster, MD  Electronically Signed      Mick Sell, MD  Electronically Signed    LW/MEDQ  D:  12/12/2007  T:  12/13/2007  Job:  [id number]   cc:   Dr. Colin Benton, M.D.

## 2010-09-06 NOTE — Discharge Summary (Signed)
NAMETAMMEE, THIELKE              ACCOUNT NO.:  [id number]   MEDICAL RECORD NO.:  [id number]          PATIENT TYPE:  INP   LOCATION:  4736                         FACILITY:  [facility]   PHYSICIAN:  Mick Sell, MD DATE OF BIRTH:  Sep 13, 1975   DATE OF ADMISSION:  10/11/2007  DATE OF DISCHARGE:  10/16/2007                               DISCHARGE SUMMARY   DISCHARGE DIAGNOSES:  1. Hyperglycemia (hyperosmolar nonketotic state), in the setting of      uncontrolled diabetes mellitus, with the last hemoglobin A1c 12.6      in 06/2007.  2. Chest pain/shortness of breath, ruled out for myocardial infarction      by serial cardiac enzymes and EKGs.  3. Fever, nasal congestion, sore throat (H1N1 test results pending).  4. History of coronary artery disease status post stent placement.  5. Obstructive sleep apnea.  6. Microcytic anemia.  7. Hypertension.  8. Asthma.  9. History of migraines.  10.History of lower extremity edema.  11.History of irregular menses  12.History of hepatitis B.  13.Anxiety/depression.  14.Morbid obesity.   MEDICATIONS ON DISCHARGE:  1. Aspirin 325 mg p.o. daily.  2. Plavix 75 mg p.o. daily.  3. Ferrous sulfate 325 mg p.o. t.i.d.  4. Advair 1 puff p.o. b.i.d.  5. Lantus 65 units subcutaneous injection nightly, along with sliding      scale insulin as taken before admission.  6. Imdur 30 mg p.o. daily.  7. Lopressor 25 mg p.o. b.i.d.  8. Singulair 10 mg p.o. daily.  9. Lyrica 75 mg p.o. daily.  10.Valtrex 1000 mg p.o. t.i.d. until October 20, 2007.  11.Effexor 37.5 mg p.o. daily.  12.Albuterol inhaler 1 puff q.4 h. p.r.n.  13.Avapro 300 mg p.o. daily.  14.Xanax 0.25 mg nightly p.r.n.  15.Protonix 40 mg p.o. daily.  16.Relpax 20 mg p.o. p.r.n.  17.Simvastatin 40 mg p.o. daily.  18.Vicodin 1 tablet q 6 hours p.r.n. headaches (12 tablets given).   The patient will have an appointment with Dr. Reynold Bowen in the outpatient  clinic on October 30, 2007, at 2:50  p.m. and with Jamison Neighbor and the  patient will need to call for appointment.  She was advised to follow a  diabetic diet.   CONDITION ON DISCHARGE:  Improved, however, still mentioning headache.   PROCEDURES:  The patient had a chest x-ray on admission showing mild  bronchitic changes.  No focal process.  The day after admission another  chest x-ray showed chronic bronchitic changes and bibasilar  atelectasis/low volume.  A CT of the head without contrast obtained on  October 15, 2007, to investigate continuing headaches showed no bleed or  other acute intracranial process.  Of note, the patient was offered CPAP  overnight, but refused.   There were no consultants.   HISTORY OF PRESENT ILLNESS:  This is a 35 year old woman with past  medical history outlined above, presenting with a 2-day history of sore  throat, nasal congestion, nausea, diarrhea, and increased thirst.  She  developed shortness of breath 1 day prior to admission and presented to  the ED.  She also  had some chest tightness and radiation to bilateral  arms that started after the shortness of breath and was associated with  diaphoresis.  She states that the chest tightness is still present.  Nothing made it better or worse, but she thought it is similar to the  pain she had with her previous MI.  She mentions that she continues to  have this chest pain ever after her cath in February 2008 and that she  saw Dr. Jacinto Halim and he was not concerned, but gave her nitroglycerin  sublingual.  She mentions that she has been taking all of her medication  without any problems; however, she missed her last 2 Lantus doses.  She  noticed that her sugars have been high and every time she comes to the  outpatient clinic her insulin dose is raised.  She denied any fever,  chills, vomiting, or sick contacts, but she mentions that she had a  birthday party 6 days prior to admission.   She has no known drug allergies.   PHYSICAL  EXAMINATION:  VITAL SIGNS:  Temperature 97.5, blood pressure  114/475, pulse 107 (of note, the pulse on arrival to [facility] ED was  126), respiration rate 18, oxygen saturation 93% on room air, and weight  127 kg.  GENERAL:  She was sleepy, but appropriately interacting when stimulated.  EYES:  Extraocular movements intact.  PERLA.  Anicteric.  ENT:  Oropharynx clear.  Good dentition.  Moist mucous membranes.  NECK:  Supple.  No lymphadenopathy.  RESPIRATORY:  Clear with good respiratory effort.  CARDIOVASCULAR:  Regular, tachycardia, and no murmurs.  GI:  Soft, nontender, positive bowel sounds but decreased, and obese.  EXTREMITIES:  No edema.  SKIN:  Warm and dry.  MENTAL STATUS:  Alert and oriented x3.  NEURO:  Nonfocal.  Insight appropriate.   LABORATORY DATA:  Sodium 128, potassium 3.6, chloride 90, bicarb 25, BUN  9, creatinine 1.06, glucose 606, anion gap 13, bilirubin 0.7, alk phos  80, AST 21, ALT 11, protein 8.6, albumin 3.4, and calcium 9.4.  White  count 15.9, hemoglobin 11.6, ANC 10.4, MCV 66.5, and platelets 354,000.  Urinalysis, specific gravity 1.37, glucose more than 1000, trace  ketones, small blood, no leukocyte esterase or nitrites, RBCs 3-6, and  few bacteria.  Fasting lipid profile showed a total cholesterol of 174,  triglycerides 183, HDL 27, LDL 110; however, this was nonfasting.  Point-  of-care cardiac markers were normal.  Hemoglobin A1c was 15.1.  An  anemia panel showed iron 18, total TIBC 337, percent saturation 5%.  Vitamin B12 is 407 and ferritin 19.  Three sets of cardiac enzymes were  normal.  Lipase 17 and magnesium 1.8.  Urine pregnancy test negative.  Blood cultures x2 were negative.  Cryptococcal antigen was negative.   ASSESSMENT AND PLAN:  This 35 year old woman with past medical history  of very poorly controlled diabetes presenting with nonketotic  hyperglycemic event and also with myalgias and upper respiratory  symptoms and headache.   1. Hyperglycemia.  Her hemoglobin A1c is very elevated, at 15.  I am      not sure if the patient is actually taking her insulin, but she is      mentioning that she takes every dose.  If she is taking it, she has      probably developped severe resistance to insulin.  She is on a      regimen of Lantus and sliding scale insulin at home and  we      continued this regimen, but increased her Lantus before discharge      to 65 which is almost double the admitting dose; and continued her      sliding scale insulin.  Initially, we kept her on Glucommander and      monitored her electrolytes and followed her CBGs and anion gap.      She will follow up in the outpatient clinic with Jamison Neighbor for      this problem.  2. Chest pain/shortness of breath.  She has a previous myocardial      infarction and she is status post stent placement, so we considred      her high risk for ACS and cycled cardiac enzymes and EKGs and      monitored her on telemetry.  All of these were negative, so at this      point we feel that her chest pain is probably not cardiac.  The      chest x-ray was positive for mild bronchitic changes, but no      pneumonia.  It did not show enlargement of the mediastinum to      suggest aortic dissection or pneumothorax.  The patient was      negative for cocaine.  She was checked for pancreatitis with a      normal lipase and was off her Protonix and Phenergan for nausea.      We continued aspirin, beta-blocker, statin, and morphine throughout      this admission and offered nitroglycerin sublingual.  3. Upper respiratory symptoms with fever.  The patient was considered      a possible victim of the H1N1 flu.  She was tested for this      (results are still pending).  She was placed in droplet precautions      and was offered Tamiflu for 5 days.  She completed the course      during her hospital stay.  Her symptoms resolved before discharge,      and she was afebrile.  4.  Headache.  She continued to have daily headaches while she was in      the hospital.  We treated her with narcotics with no significant      relief.  We obtained a CT of her head to make sure that there is no      space-occupying lesion or other possible process going on and this      was negative.  At this point, I believe that this might be      medication overuse headache. The headache could also have been      related to her flu-like symptoms.  The patient was offered Depakote      1 g intravenous over 20 minutes x1 and was also offered Ultram 50      mg p.o. q.6 h. pain with some relief.  5. Microcytic anemia.  An anemia panel done in the hospital showed      iron deficiency anemia and she was discharged on iron      supplementation.  She also reports pica for ice, but she does not      report blood in stools.  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  6. Dyslipidemia.  We continued her statin.  Her goal LDL is less than      70.  7. Herpes simplex labialis.  We offered  the patient acyclovir p.o. and      she will complete the course in outpatient.  8. Hypertension.  The blood pressure was overall increased for a      diabetic patient, but she reported headache and she was in distress      because of her flu-like illness.  We treated her pain and increased      Lopressor.   LABS AT DISCHARGE:  Temperature 98.9, systolic blood pressure 143,  diastolic 95, pulse 94. respirations 20, and oxygen saturation 95% of 2  L.  CBG 244, sodium 136, potassium 3.6, chloride 99, bicarb 31, BUN 4,  creatinine 0.75, and glucose 293.  White count 13.8, hemoglobin 10.3,  and platelets 458,000.      Carlus Pavlov, M.D.  Electronically Signed      Mick Sell, MD  Electronically Signed    CG/MEDQ  D:  10/18/2007  T:  10/19/2007  Job:  [id number]   cc:   Cristy Hilts. Jacinto Halim, MD

## 2010-09-08 ENCOUNTER — Emergency Department (HOSPITAL_COMMUNITY)
Admission: EM | Admit: 2010-09-08 | Discharge: 2010-09-08 | Disposition: A | Discharge status: Home / Self Care | Payer: Medicaid Other | Attending: Emergency Medicine | Admitting: Emergency Medicine

## 2010-09-08 ENCOUNTER — Emergency Department (HOSPITAL_COMMUNITY): Payer: Medicaid Other

## 2010-09-08 DIAGNOSIS — R112 Nausea with vomiting, unspecified: Secondary | ICD-10-CM | POA: Insufficient documentation

## 2010-09-08 DIAGNOSIS — Z79899 Other long term (current) drug therapy: Secondary | ICD-10-CM | POA: Insufficient documentation

## 2010-09-08 DIAGNOSIS — E119 Type 2 diabetes mellitus without complications: Secondary | ICD-10-CM | POA: Insufficient documentation

## 2010-09-08 DIAGNOSIS — R109 Unspecified abdominal pain: Secondary | ICD-10-CM | POA: Insufficient documentation

## 2010-09-08 LAB — DIFFERENTIAL
Eosinophils Relative: 0 % (ref 0–5)
Lymphs Abs: 3.7 10*3/uL (ref 0.7–4.0)
Monocytes Absolute: 1.4 10*3/uL — ABNORMAL HIGH (ref 0.1–1.0)
Monocytes Relative: 8 % (ref 3–12)
Neutro Abs: 12.6 10*3/uL — ABNORMAL HIGH (ref 1.7–7.7)

## 2010-09-08 LAB — POCT I-STAT, CHEM 8
BUN: 19 mg/dL (ref 6–23)
Calcium, Ion: 1.1 mmol/L — ABNORMAL LOW (ref 1.12–1.32)
Chloride: 105 mEq/L (ref 96–112)
Creatinine, Ser: 0.9 mg/dL (ref 0.4–1.2)
Glucose, Bld: 137 mg/dL — ABNORMAL HIGH (ref 70–99)
HCT: 39 % (ref 36.0–46.0)
Hemoglobin: 13.3 g/dL (ref 12.0–15.0)
Potassium: 4 mEq/L (ref 3.5–5.1)
Sodium: 136 mEq/L (ref 135–145)
TCO2: 22 mmol/L (ref 0–100)

## 2010-09-08 LAB — CBC
HCT: 35.8 % — ABNORMAL LOW (ref 36.0–46.0)
Hemoglobin: 11.7 g/dL — ABNORMAL LOW (ref 12.0–15.0)
MCH: 20.2 pg — ABNORMAL LOW (ref 26.0–34.0)
MCHC: 32.7 g/dL (ref 30.0–36.0)
RDW: 18.7 % — ABNORMAL HIGH (ref 11.5–15.5)

## 2010-09-09 ENCOUNTER — Telehealth: Payer: Self-pay | Admitting: *Deleted

## 2010-09-09 NOTE — Discharge Summary (Signed)
[facility]  Patient:    Shirley [name], Shirley [name] Visit Number: [id number] MRN: [id number]          Service Type: MED Location: [id number] Attending Physician:  Lonia Blood Dictated by:   Jetty Duhamel, M.D. Admit Date:  06/10/2001 Discharge Date: 06/14/2001   CC:         [facility]   Discharge Summary  DATE OF BIRTH:  [birth year]  PRIMARY CARE PHYSICIAN:  [facility].  GYN PHYSICIAN:  Kathreen Cosier, M.D.  DISCHARGE DIAGNOSES: 1. Lingular and right lower lobe community-acquired pneumonia. 2. Asthma with acute exacerbation secondary to #1. 3. Bilateral breast abscesses - resolving. 4. Distant history of cesarean section. 5. Morbid obesity. 6. Hyperglycemia secondary to steroid use - no formal diagnosis of diabetes.  DISCHARGE MEDICATIONS: 1. Tessalon Perles 100 mg two tablets three times a day p.r.n. cough. 2. Combivent inhaler two puffs q.i.d. 3. Flovent 110 mcg inhaler one puff b.i.d. 4. Humibid LA 600 mg b.i.d. p.r.n. 5. Tequin 400 mg q.d. for 10 days, then stop. 6. Prednisone 10 mg - three a day on February 22, two a day on February 23,    one a day on February 24, then stop.  FOLLOW-UP:  Patient will follow up in the [facility] outpatient clinic on June 25, 2001, at 10:45 in the morning.  At that time, the pulmonary exam should be performed to assure that there is no wheezing and appropriate air movement throughout.  A chest x-ray should be considered for follow-up of bilobar pneumonia.  CBGs should be obtained to assure that the patients hyperglycemia has resolved off of prednisone.  CONSULTATIONS:  None.  PROCEDURES:  None.  HISTORY OF PRESENT ILLNESS:  Shirley [name] is a morbidly obese 35 year old female who presented to the [facility] emergency room with a one week history of gradually progressive shortness of breath.  She reported cough, nausea, vomiting, poor p.o. intake, and  subjective fevers.  The date of admission, her shortness of breath had become greatly exacerbated and, therefore, she presented for acute evaluation.  HOSPITAL COURSE BY ACTIVE PROBLEM: #1 - BILOBAR COMMUNITY-ACQUIRED PNEUMONIA:  Admission chest x-ray revealed a lingular and right lower lobe infiltrate consistent with community-acquired pneumonia.  Signs and symptoms support this diagnosis.  The patient was initially treated with IV Tequin and responded well to this therapy.  She was successfully tapered to p.o. Tequin and was discharged home to complete a full 14-day course of this antibiotic therapy.  She was additionally given Humibid for expectoration and Tessalon for symptom control.  #2 - ACUTE ASTHMA EXACERBATION:  Patient was known to have a baseline history of asthma since early childhood.  With her community-acquired pneumonia, she was noted to have a significant degree of expiratory wheeze.  She was not on a standing regimen of inhalers at home.  Patient was placed on albuterol and Atrovent hand-held nebulizers to begin with and dosed with IV Decadron. Decadron was gradually tapered when patients bronchospasm resolved and the patient was titrated to MDIs.  Patient tolerated this well.  Patient was advised on the appropriate use of MDIs.  Patient is discharged home on Combivent MDI as well as Flovent.  She is discharged on a rapidly tapering prednisone course and should discontinue all prednisone use approximately three days after her discharge.  Routine follow-up in the outpatient setting should include appropriate prophylactic care of the patients asthma and ongoing education of the appropriate care of  this disease.  #3 - MORBID OBESITY:  Patient was advised that her asthma as well as future medical complications would be greatly alleviated by losing weight.  #4 - BILATERAL BREAST ABSCESSES:  Patient reported a history of intermittent abscesses of the breast over the last  couple of years.  These breasts were evaluated while she was here.  In fact, the findings were consistent with typical staph or strep abscesses.  Tequin resolved these abscesses completely prior to her discharge.  Patient was advised on the appropriate use of antibacterial soap and hygiene to help decrease chances of abscess reformation and was further advised weight loss would likely assist in this goal.  #5 - STEROID-INDUCED HYPOGLYCEMIA:  At time of admission, patient had no formal diagnosis of diabetes.  It is likely, however, that she has impaired glucose intolerance simply secondary to her obesity.  Patient was advised of this.  Nonetheless, with the use of Decadron, blood sugar clearly became significantly elevated.  She required sliding scale insulin during her hospitalization.  In that a rapid taper of prednisone is to be carried out, it was not felt necessary to place the patient on a hypoglycemic agent at the time of her discharge.  It is advisable, however, that her primary care physician follow her blood sugars in the outpatient setting to assure that her hypoglycemia does resolve completely and to get further annual screening for possible development of diabetes in the near future.  CONDITION AT DISCHARGE:  At time of discharge, patient was afebrile, vital signs were stable.  She was ambulating in the hallway without difficulty.  A room air oxygen saturation was 95% on room air. Dictated by:   Jetty Duhamel, M.D. Attending Physician:  Jetty Duhamel T DD:  06/14/01 TD:  06/15/01 Job: [id number]

## 2010-09-09 NOTE — Discharge Summary (Signed)
Shirley [name], Shirley [name]              ACCOUNT NO.:  [id number]   MEDICAL RECORD NO.:  [id number]          PATIENT TYPE:  INP   LOCATION:  1430                         FACILITY:  [facility]   PHYSICIAN:  Michaelyn Barter, M.D. DATE OF BIRTH:  1976-03-24   DATE OF ADMISSION:  05/06/2006  DATE OF DISCHARGE:  05/11/2006                               DISCHARGE SUMMARY   PRIMARY CARE PHYSICIAN:  [facility] outpatient clinic.   FINAL DIAGNOSES:  1. Acute asthma exacerbation.  2. Chest tightness.  3. Bronchitis.  4. Depression.  5. Poorly controlled diabetes mellitus.  6. Leukocytosis.   PROCEDURES:  1. Chest x-ray completed May 06, 2006.  2. Portable chest x-ray completed May 10, 2005.  3. A 2-D echocardiogram completed May 10, 2006.   HISTORY OF PRESENT ILLNESS:  Ms. Osorto is a 35 year old female who  stated that beginning 2 weeks prior to this admission she developed  dyspnea, chest tightness, wheezing, cough which was productive as well  as low-grade fevers and some dizziness. She was seen in the [facility]  emergency room on April 25, 2006. She was told she had an upper  respiratory tract infection/bronchitis. A week's course of amoxicillin  and prednisone were prescribed. [name] symptoms did not improve.  The night prior to this admission she began to develop more dyspnea and  chest tightness accompanied by wheezing as well as persistent productive  cough. Her chest pains with coughing and deep breathing persisted. She  had four episodes of vomiting a greenish-yellow material. Therefore she  came to the hospital for further evaluation.   HOSPITAL COURSE:  PROBLEM #1. Acute asthma exacerbation. A chest x-ray  was completed on May 06, 2006, it revealed diffuse peribronchial  thickening with mild basilar atelectasis. No definite focal air space  disease is seen. No pleural effusions were seen. The patient was started  on oxygen via nasal cannula as  well as Solu-Medrol IV, Avalox 400 mg IV  daily, and nebulized breathing treatments consisting of Xopenex. Over  the course of the patient's  hospitalization her breathing improved as  did her cough.  By the date of discharge her wheezing resolved. The  patient was titrated from the IV steroids down to prednisone.  When the  patient arrived her white blood cell count was noted to be 18,000. By  the date of discharge the patient's shortness of breath had resolved.   PROBLEM #2. Chest tightness. The etiology of this was most likely  related to the patient's acute asthma exacerbation. The patient was  provided with p.r.n. pain medications. Her cardiac markers were checked.  On January 16 the patient's troponin I was found to be 0.01, 0.02, and  0.01. Likewise, CK-MBs were found to be 1.5, 1.3, and 0.9. Therefore the  patient ruled out for MI via cardiac enzymes. The patient did have a 2D  echocardiogram completed on January 17th. It revealed overall left  ventricular systolic function to be normal. Left ventricular EF was  estimated to be 60%. There were no left ventricular regional wall motion  abnormalities. Left ventricular wall thickness was mildly  increased. A  cardiology consultation was placed secondary to the patient's persistent  chest pain.  [facility] saw the patient. They  indicated that the patient's chest pain sounded non cardiac and they  suspected that it was secondary to the patient's coughing as well as her  asthmatic and bronchitis situation. They stated that the patient might  ultimately require an outpatient Dobutamine Myoview to risk stratify  her. By the date of discharge the patient did not complain of any chest  pain or discomfort.   PROBLEM #3. Bronchitis.  This was depicted on the chest x-ray that was  completed when the patient first arrived. She was treated with Avalox  throughout her hospitalization.   1. Diabetes mellitus. [name]  sugars were poorly controlled      during the course of her hospitalization. Sliding scale insulin was      provided to help offset the elevated sugars. The elevation of her      sugars may have been secondary to the steroids that the patient was      provided.   1. Leukocytosis. The patient did have a persistent leukocytosis over      the course of her hospitalization. This may have been associated      with the patient's respiratory tract infection. The fact that the      patient was on steroids may have also contributed to this. A final      chest x-ray was completed on May 10, 2006. The radiologist      indicated that there was a new bilateral lower lobe air space      disease consistent with atelectasis or pneumonia. No definite heart      failure was seen. The patient remained on antibiotics throughout      the course of her hospitalization.   By the date of discharge the patient was lying in bed, easily awakened  and she showed no obvious distress when I initially entered her room.  She did not complain of any shortness of breath. On clinical examination  there were no wheezes present throughout either side of her lungs. She  looked comfortable.  Her vital signs on the date of discharge revealed a  temperature of 97.9, heart rate 87, respirations 20, blood pressure  152/96 and O2 was 99% on 2 liters.  Labs showed a white count of 20.9  thousand, hemoglobin 12.5, hematocrit 37.9, platelets 435,000. Her  sodium was 135, potassium  4.4, chloride 99, respirations 29, BUN 20,  creatinine 0.93, glucose 310, calcium 9.2. Urinalysis was negative. The  decision was made to discharge the patient from the hospital. Clinically  the patient did look better on the date of discharge. However again, she  did have a leukocytosis. She remained afebrile throughout the course of  her hospitalization. It was not clear as to whether or not the leukocytosis was secondary to ongoing infection versus  the fact the  patient was on high dosages of steroids.   DISCHARGE MEDICATIONS:  1. Mucinex 600 mg one tablet p.o. b.i.d.  2. Hydrochlorothiazide 25 mg one tablet p.o. daily.  3. Combivent MDI two puffs x4 daily.  4. Lantus insulin 12 units q.h.s.  5. Lisinopril one tablet p.o. b.i.d.  6. Metformin 500 mg one tablet b.i.d.  7. Moxifloxacin 400 mg p.o. daily.  8. Protonix 40 mg one tablet p.o. daily.   The patient was scheduled for a Dobutamine Myoview stress-test on  May 28, 2006 at 9:45  a.m. at [facility].      Michaelyn Barter, M.D.  Electronically Signed    OR/MEDQ  D:  05/13/2006  T:  05/13/2006  Job:  [id number]

## 2010-09-09 NOTE — Discharge Summary (Signed)
Shirley [name], Shirley [name]              ACCOUNT NO.:  [id number]   MEDICAL RECORD NO.:  [id number]          PATIENT TYPE:  INP   LOCATION:  3741                         FACILITY:  [facility]   PHYSICIAN:  Cristy Hilts. Jacinto Halim, MD       DATE OF BIRTH:  October 29, 1975   DATE OF ADMISSION:  06/15/2006  DATE OF DISCHARGE:  06/21/2006                               DISCHARGE SUMMARY   DISCHARGE DIAGNOSES:  1. 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  2. Urgent PDA percutaneous transluminal coronary angioplasty June 15, 2006.  Relook June 19, 2006 showing patency of the site.  3. Asthmatic chronic obstructive pulmonary disease.  4. Insulin-dependent diabetes.  5. Morbid obesity.  6. Positive hepatitis B.  7. Anxiety and depression.   HOSPITAL COURSE:  The patient is a 35 year old female followed by Dr.  Jacinto Halim and the [facility] who was just discharged after  an admission for evaluation of chest pain.  The patient had a  catheterization earlier this month that showed essentially normal  coronaries.  She was treated for asthmatic COPD flare.  She was  readmitted June 15, 2006 with chest pain, weakness and EKG  abnormalities showing some ST elevation in inferior leads.  She was  treated as a code STEMI.  She was admitted to the emergency room and  taken urgently the cath lab by Dr. Jenne Campus.  There was a total occlusion  of a distal PDA that was treated with an angioplasty.  Her other  coronaries were normal.  CKs peaked as noted at 649 with 95 MBs.  The  patient was monitored postprocedure.  She was seen in consult by the  [facility], Dr. Cristal Deer End took care of her  during this hospitalization.  They helped with treatment of her asthma,  diabetes, sleep apnea and hepatitis B.  She did have recurrent chest  pain and shortness of breath.  It was noted that she had just had  negative spiral CT done June 07, 2006.  It was decided to proceed  with diagnostic catheterization which was done by Dr. Allyson Sabal June 19, 2006.  This revealed the RCA site to be widely patent.  We feel she can  be discharged June 21, 2006.  She will need followup for her  diabetes, her asthma, her anemia and her hepatitis, all which will be  taken care of by [facility].  She has been set up for an  outpatient sleep study.  We will have Dr. Jacinto Halim see her back in the  office for cardiology followup.   DISCHARGE MEDICATIONS:  1. Effexor XR 37.5 mg once a day.  2. Omeprazole 20 mg a day.  3. Singulair 10 mg a day.  4. Plavix 75 mg a day.  5. Metoprolol 50 mg b.i.d.  6. Imdur 30 mg a day.  7. Iron 325 mg twice a day with food.  8. Nitroglycerin sublingual p.r.n.  9. Lantus insulin 50 units once a day.  10.Cipro 500 mg b.i.d. for 3 days for UTI.  11.Avapro 300 mg a day.  12.Coated aspirin daily.  13.Advair Diskus.   She has been told to stop her metformin, prednisone, lisinopril and  hydrochlorothiazide.   LABORATORIES AT DISCHARGE:  Sodium 141, potassium 3.8, BUN 7, creatinine  0.7, glucose 132.  White count 15.7, hemoglobin 10.5, hematocrit 31.2.  Liver functions were normal.  CK peaked as noted.  B12 level is 437.  TSH 1.9.  Hep B surface antigen is positive.  Hemoglobin A1c is 11.4.  Chest x-ray on the 23rd shows mild basilar atelectasis.   DISPOSITION:  The patient is discharged in stable condition and will  follow up with Dr. Jacinto Halim and [facility].  She is set up for a  sleep study March 19.      Abelino Derrick, P.A.      Cristy Hilts. Jacinto Halim, MD  Electronically Signed    LKK/MEDQ  D:  06/21/2006  T:  06/21/2006  Job:  [id number]

## 2010-09-09 NOTE — Telephone Encounter (Signed)
Noted  

## 2010-09-09 NOTE — Discharge Summary (Signed)
Shirley [name], Shirley [name]              ACCOUNT NO.:  [id number]   MEDICAL RECORD NO.:  [id number]          PATIENT TYPE:  EMS   LOCATION:  ED                           FACILITY:  [facility]   PHYSICIAN:  Doug Sou, M.D.   DATE OF BIRTH:  13-Oct-1975   DATE OF ADMISSION:  11/16/2004  DATE OF DISCHARGE:  11/17/2004                                 DISCHARGE SUMMARY   ADDENDUM TO DISCHARGE SUMMARY:   CHIEF COMPLAINT:  Headache.   HISTORY:  The patient is a 35 year old black female who presents with a  headache, right sided parietal, gradual in onset, approximately 1 hour prior  to coming here accompanied by nausea, sweatiness, and vertigo. She gets  headaches approximately three times per week for the past year. She  developed tingling in all of her fingers tonight which has since resolved.  No treatment prior to coming here.   PHYSICAL EXAMINATION:  GENERAL:  A morbidly obese female, non toxic, alert.  Glasgow coma score 15.  VITAL SIGNS ON ARRIVAL:  At 1934 Hr. respirations were 22, pulse 103, blood  pressure 152/105, temperature 98.5. Repeat vital signs at 1959 Hr. blood  pressure 130/80, pulse 95, respirations 20.  PSYCHIATRIC:  Pleasant, cooperative.  HEENT EXAM:  Normocephalic, atraumatic. Mucous membranes moist, pink.  NEUROLOGIC:  Alert, Glasgow coma score 15. Moves all extremities well. Motor  and sensory 5/5 overall. Neurologic exam nonfocal.   ASSESSMENT:  Strongly doubt subarachnoid hemorrhage, i.e., atypical  symptoms. The patient gets chronic headaches. Symptoms most likely  consistent with migraine headache.       SJ/MEDQ  D:  11/17/2004  T:  11/17/2004  Job:  [id number]

## 2010-09-09 NOTE — Cardiovascular Report (Signed)
NAMEMARKEL, MERGENTHALER              ACCOUNT NO.:  [id number]   MEDICAL RECORD NO.:  [id number]          PATIENT TYPE:  OIB   LOCATION:  2807                         FACILITY:  [facility]   PHYSICIAN:  Darlin Priestly, MD  DATE OF BIRTH:  [birth year]   DATE OF PROCEDURE:  06/15/2006  DATE OF DISCHARGE:                            CARDIAC CATHETERIZATION   INDICATION:  Shirley [name] is a 35 year old female patient of Dr. Yates Decamp  with a history of poorly-controlled diabetes, obesity, history of recent  cardiac catheterization with reportedly normal coronaries.  She was  brought to the ER after she was complaining of sudden onset of chest  pain this morning while having a bowel movement.  In EMS she was noted  to have inferior ST elevation.  She is now brought urgently to the  cardiac cath lab for PCI.   DESCRIPTION:  After informed consent the patient was brought to the  cardiac cath lab where right groin was shaved, prepped and draped in  usual sterile fashion.  __________  established.  Using modified  Seldinger technique a 6-French arterial sheath was inserted in the right  femoral artery.  A 6-French diagnostic catheter was used to perform  diagnostic angiography.   The left main is a large vessel with no significant disease.   The LAD is a large vessel __________  2 diagonal branches.  The LAD has  no significant disease.   The 1st diagonal is a medium to large-sized vessel which bifurcates  distally with no significant disease.   The 2nd diagonal is a medium-size vessel with no significant disease.   Left circumflex is a medium-sized vessel.  __________  2 obtuse marginal  branches.  The __________  circumflex has no significant disease.   The 1st OM is a medium-sized vessel was bifurcates distally with no  significant disease.   The 2nd OM is a small vessel with no significant disease.   The right coronary artery is a large vessel and is dominant, gives rise  to both PDA as  well as posterolateral branch.  There is mild narrowing  in the proximal portion of the RCA but there does not appear to be any  significant disease there.  The PLA is a small vessel with no  significant disease.   The PDA appears to be a small to medium-sized vessel which is totally  occluded in its proximal segment.   HEMODYNAMICS:  Systemic arterial pressure 118/83.   INTERVENTION/PROCEDURE:  Following diagnostic angiography a #6-French JR-  4 guiding catheter with side holes was __________  right coronary  ostium.  Angiogram was then performed.  It should be noted the patient  did fibrillate during the diagnostic portion of the RCA angiograms and  was shocked x1 with restoration of sinus rhythm, and remained stable  from that point.  Next a 0.014 Prowater guidewire was advanced  __________  the guiding catheter into the distal RCA and then used to  cross the PDA total occlusion, though this was noted be relatively small  vessel.  Over this a Maverick 2 x 50 mm balloon was  then used to cross  the total occlusion and 4 inflations then performed for a total of 8  atmospheres for a total of approximately 2 minutes and 25 seconds.  Followup angiogram revealed good luminal gain with restoration of TIMI  III flow.  There did appear to be a small upper branch which we were  able to cross into with the guidewire, and then we did do 1 inflation to  4 atmospheres for a total of 27 seconds.  Again followup angiogram  revealed good luminal gain with no evidence of dissection or thrombus  and TIMI III flow to this vessel.  IV Integrilin was used throughout the  case.  An intravenous dose of heparin given to maintain the ST between  200-300.   Final orthogonal angiograms revealed less than 20% stenosis in the PDA  with TIMI III flow to the distal vessel.  At this point it was elected  to conclude the procedure.  All balloons __________ were removed.  Hemostatic sheaths were sewn in place and  the patient transferred back  to the ward in stable condition.   CONCLUSION:  1. Successful percutaneous transluminal coronary balloon angioplasty      of the proximal posterior descending artery upper and lower branch,      though this is noted to be a small 2.0 vessel and smaller distally.  2. Adjuvant use of Integrilin infusion.      Darlin Priestly, MD  Electronically Signed     RHM/MEDQ  D:  06/15/2006  T:  06/15/2006  Job:  [id number]   cc:   Cristy Hilts. Jacinto Halim, MD
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
## 2010-09-09 NOTE — Cardiovascular Report (Signed)
Shirley [name], REXRODE              ACCOUNT NO.:  [id number]   MEDICAL RECORD NO.:  [id number]          PATIENT TYPE:  INP   LOCATION:  3741                         FACILITY:  [facility]   PHYSICIAN:  [name], M.D.   DATE OF BIRTH:  1975-12-14   DATE OF PROCEDURE:  06/19/2006  DATE OF DISCHARGE:                            CARDIAC CATHETERIZATION   [name] is an unfortunate 35 year old female admitted on June 15, 2006, with chest pain.  She underwent diagnostic coronary angiography by  Dr. Lenise Herald as an acute and was found to have an occluded PDA  which was angioplastied.  This appeared to be filling defect consistent  with thrombus.  The patient did evolve a myocardial infarction.  She is  been treated medically since.  Because of her current chest pain, she  presents now for relook angiography to define her anatomy and rule out a  continuing ischemic etiology.   DESCRIPTION OF PROCEDURE:  The patient brought to the second floor [facility] in the postabsorptive state.  She was premedicated  with  p.o. Valium.  Right groin was prepped and shaved in the usual  sterile fashion.  A 6-French sheath was inserted into the right femoral  artery under direct visualization and using a Doppler tipped Smart  needle because of the depth of the artery and the patient's body  habitus.  The 6-French right and left Judkins diagnostic catheters were  used for selective coronary angiography.  Left ventriculography was not  performed.  Visipaque dye was used for the entirety of the case.  Retrograde aortic pressures monitored during the case which covered in  the 100 range.   Selective cholangiography:  1. Left main normal.  2. LAD normal.  3. Left circumflex normal.  4. Right coronary artery was dominant and normal.  The PDA was widely      patent and actually looked somewhat improved compared to the final      shot after the acute intervention.   IMPRESSION:   [name] has widely patent coronaries.  The etiology of  her pain is unclear.  It may be noncardiac or post myocardial infarction  chest pain such as Dressler's syndrome.  Continued medical therapy will  be recommended.   An ACT was measured and the sheath was removed.   Pressure was held on the groin to achieve hemostasis.  The patient left  the lab in stable condition.      [name], M.D.  Electronically Signed     JB/MEDQ  D:  06/19/2006  T:  06/19/2006  Job:  [id number]   cc:   2nd floor [facility]  [facility]  [facility]

## 2010-09-09 NOTE — Discharge Summary (Signed)
NAMEJEHAN, RANGANATHAN              ACCOUNT NO.:  [id number]   MEDICAL RECORD NO.:  [id number]          PATIENT TYPE:  INP   LOCATION:  1406                         FACILITY:  [facility]   PHYSICIAN:  Lonia Blood, M.D.       DATE OF BIRTH:  October 28, 1975   DATE OF ADMISSION:  06/07/2006  DATE OF DISCHARGE:  06/09/2006                               DISCHARGE SUMMARY   PRIMARY CARE PHYSICIAN:  Is with the [facility].   DISCHARGE DIAGNOSES:  1. Chest pain of unclear etiology.  2. Obesity hypoventilation syndrome.  3. Probable obstructive sleep apnea.  4. Hypertension.  5. Diabetes mellitus.  6. Anxiety.  7. Pulmonary hypertension.  8. Candida intertrigo.   DISCHARGE MEDICATIONS:  1. Diflucan 100 mg daily.  2. Metformin 1000 mg twice daily.  3. Effexor 37.5 mg daily.  4. Hydrochlorothiazide 25 mg daily.  5. Omeprazole 20 mg daily.  6. Lisinopril 10 mg daily.  7. Lantus 20 units at bedtime.  8. Singulair 10 mg daily.  9. Albuterol inhaler t.i.d.  10.Prednisone taper.  11.Xanax 0.25 mg q.8h. p.r.n. anxiety.   CONDITION AT TIME OF DISCHARGE:  Shirley [name] was discharged in good  condition.  At the time of discharge, she was chest pain free.  The  patient was instructed to follow up with her primary care physician with  [facility].   PROCEDURES DURING THIS ADMISSION:  1. The patient underwent a ventilation-perfusion scan which was read      as low probability for pulmonary embolus.  2. Cardiac catheterization by Dr. Jacinto Halim with findings of no      significant obstructive coronary artery disease and pulmonary      hypertension, hypertension.   HISTORY AND PHYSICAL:  For admission history and physical, refer to  dictated H&P done by Dr. [name] on June 28, 2006.   HOSPITAL COURSE:  1. Shortness of breath and chest pain.  Ms. Lucus was admitted with      complaints of shortness of breath.  Her dyspnea was fully      evaluated, and it was felt that was  multifactorial.  Dyspnea was      related to a mild asthma exacerbation as well as obesity      hypoventilation obstructive sleep apnea.  The patient also      underwent cardiac catheterization which did not reveal presence of      obstructive coronary disease, but it did reveal the presence of      pulmonary hypertension.  The patient was placed on medical      treatment and was instructions to follow up with her primary      cardiologist as well as with the primary care physician.  2. Uncontrolled diabetes mellitus, type 2.  Ms. Atilano's medications      and insulin doses were adjusted during this hospitalization, and      she was educated about the importance of diet, exercise, and close      followup.  3. Mild leukocytosis secondary to prednisone use.  4. Funguria.  This was treated with  5 days of Diflucan, and the      patient was to continue the treatment as an outpatient.      Lonia Blood, M.D.  Electronically Signed     SL/MEDQ  D:  06/29/2006  T:  06/29/2006  Job:  [id number]   cc:   [facility]

## 2010-09-09 NOTE — H&P (Signed)
Shirley [name], Shirley [name]              ACCOUNT NO.:  [id number]   MEDICAL RECORD NO.:  [id number]          PATIENT TYPE:  INP   LOCATION:  0101                         [facility]ACILITY:  [facility]   PHYSICIAN:  Marcellus Scott, MD     DATE OF BIRTH:  [birth year]   DATE OF ADMISSION:  05/06/2006  DATE OF DISCHARGE:                              HISTORY & PHYSICAL   PRIMARY CARE PHYSICIAN:  Patient's primary care physician is at the  [facility].   PSYCHIATRIST:  Is at the [facility].   CHIEF COMPLAINT:  Dyspnea.   HISTORY OF PRESENTING ILLNESS:  Shirley [name] is a pleasant 35 year old  African-American female with a past medical history of asthma, type 2  diabetes, hypertension, obesity, depression who was in her usual state  of health until approximately 2 weeks ago.  Since 2 weeks patient has  noticed that she has been having worsening dyspnea, tightness of the  chest, wheezing, cough productive of yellow-green sputum, low grade  fevers, some dizziness.  For this she was seen in the emergency room at  [facility] on the 2nd of January.  She was assessed to have an  upper respiratory infection/bronchitis.  She was placed on a week's  course of amoxicillin and prednisone.  However, patient says that she  has not felt better.  Last night, when she was at a [employer], she  noticed worsening of her dyspnea with chest tightness, wheezing,  persistent coughing with intermittent greenish yellow sputum, chest  pains with coughing and deep breathing and four episodes of vomiting  greenish-yellow stuff following the repeated bouts of coughing.  She  also felt dizzy and a Sparlin weak, and hence presented to the emergency  room.  Currently she says she is feeling slightly better.   PAST MEDICAL HISTORY:  1. Type 2 diabetes of 3 years duration.  2. Hypertension, also of 3 years duration.  3. Asthmatic since age of 8 years.  4. Migraine headaches.  5.  Obesity.  6. Depression.   PAST SURGICAL HISTORY:  Cesarean section.   DRUG ALLERGIES:  SHE HAS NO KNOWN DRUG ALLERGIES.   MEDICATIONS:  1. Hydrochlorothiazide 25 mg p.o. daily.  2. Metformin 500 mg p.o. b.i.d.  3. Effexor, dose unknown.  4. Lisinopril, dose unknown.  5. Imitrex one puff inhaled daily.  6. Albuterol inhaler p.r.n.   FAMILY HISTORY:  1. Grandfather died of MI at unknown age.  2. Aunt with history of CHF.  3. Great grandmother with history of stroke.   SOCIAL HISTORY:  Patient is unemployed.  She lives with her two children  ages 42 and 49 years old.  The patient quit smoking at age 64.  No  history of alcohol intake or drug abuse.  Last menstrual period is November 06, 2005.  The patient with a history of irregular menses prior to that.   REVIEW OF SYSTEMS:  HEAD, EYE, ENT:  Patient with intermittent headaches  and dizziness.  No visual symptoms.  Denies any earache, no sore throat  or difficulty swallowing.  GENERAL:  Generalized weakness with no fever,  chills or rigors.  RESPIRATORY SYSTEM:  As per the History of Presenting  Illness.  CARDIOVASCULAR SYSTEM:  No chest pain, palpitations,  orthopnea, PND.  GI:  History of 4 episodes of vomiting since last night  and nausea.  There is no history of abdominal pain, diarrhea or  constipation.  URINARY:  History of frequency of urine but no dysuria,  urgency.  CENTRAL NERVOUS SYSTEM:  There is no slurred speech, tongue or  mouth twisting, asymmetric limb weakness or tingling or numbness.  EXTREMITIES:  Without any pain or swelling or calf pain.  MUSCULOSKELETAL SYSTEM:  With generalized aches and pains.  SKIN:  Without any rashes.  PSYCHIATRIC:  Without delusions, hallucinations,  suicidal or homicidal ideations.   PHYSICAL EXAMINATION:  Shirley [name] is a moderately built and obese female  patient currently lying supine in bed with no cardiopulmonary or painful  distress.  Temperature is 98.3, pulse is 111 per  minute, regular,  respiration is 20 per minute, blood pressure is 123/69, saturating at  98%.  HEAD, EYE, ENT:  Head is nontraumatic, normocephalic.  Pupils are equal,  round, symmetrical, reacting to light and accommodation.  Extraocular  muscle movements are intact.  Mucosa is pink and moist and anicteric.  Oral cavity with minimal pharyngeal redness but no exudates.  NECK:  Thick, without any goiter, bruit, JVD or lymphadenopathy.  RESPIRATORY:  There is fair breath sounds heard bilaterally, occasional  rhonchi posteriorly with no crackles or wheezing.  CARDIOVASCULAR:  First an second heart sounds are heard, tachycardic but  regular.  No third or fourth heart sounds.  No murmurs, rubs, gallops or  clicks.  ABDOMINAL:  Abdomen is obese but nontender.  No organomegaly or mass  appreciated.  Bowel sounds are present.  CENTRAL NERVOUS SYSTEM:  Patient awake, alert, oriented x3, there is no  cranial nerve deficits.  EXTREMITIES:  Without any tenderness or swollen joints.  There is no  clubbing, cyanosis or edema.  Bilateral peripheral pulses are  symmetrically felt.  SKIN:  Without any rashes.   LAB DATA:  The CBC is with a hemoglobin of 12 and hematocrit of 35.5,  MCV of 70, white blood cell count of 18, platelet count of 481,000.  Basic metabolic panel is basically unremarkable except for a glucose of  184.  Her point of care cardiac markers are negative.  Her chest x-ray  has been preliminarily reported as low volume and basilar atelectasis.  Peribronchial thickening without focal airspace opacity.   ASSESSMENT AND PLAN:  Shirley [name] is a 35 year old female with a history  of asthma, diabetes, hypertension, obesity and depression now with:  1. Acute exacerbation of asthma.  Will admit patient to telemetry.      Will place the patient on oxygen and bronchodilator nebulizations,      IV Solu-Medrol to be switched to oral prednisone.  Will place     patient on Mucinex for loosening of  secretions.  Will also place      the patient on Robitussin syrup for her cough.  Will monitor her      peak expiratory flow rate before and after bronchodilator therapy.      Will also check patient's D-dimer in the emergency room.  2. Acute bronchitis.  Will place patient on intravenous Avelox and to      be switched to oral Avelox while the patient is able to tolerate      without vomiting.  3. Diabetes.  Will place patient on CBGs and continue her metformin.      Will place the patient on sliding scale insulin.  4. Hypertension which is at this time controlled.  Will place patient      on her home medications of lisinopril and will hold      hydrochlorothiazide at this time.  5. Depression.  Will continue Effexor.  6. Deep vein thrombosis and gastrointestinal prophylaxis.  7. 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  This is to be followed up as an outpatient.  8. Leukocytosis, questionable secondary to steroid effect/stress/acute      bronchitis.  Will check her urinalysis for evidence of urinary      tract infection and follow up these laboratories.  9. Sinus tachycardia probably secondary to her respiratory status and      her nebulized bronchodilator.  Will place her on telemetry and use      Xopenex as a nebulizer.  10 Post tussive vomiting. Management as above and diet as tolerated.      Marcellus Scott, MD  Electronically Signed     AH/MEDQ  D:  05/06/2006  T:  05/06/2006  Job:  [id number]   cc:   [facility]

## 2010-09-09 NOTE — H&P (Signed)
Shirley [name], Shirley [name]              ACCOUNT NO.:  [id number]   MEDICAL RECORD NO.:  [id number]          PATIENT TYPE:  WOC   LOCATION:  WOC                          FACILITY:  [facility]   PHYSICIAN:  Shirley [name], M.D.DATE OF BIRTH:  October 09, 1975   DATE OF ADMISSION:  06/07/2006  DATE OF DISCHARGE:                              HISTORY & PHYSICAL   PRIMARY CARE PHYSICIAN:  Unassigned.   CHIEF COMPLAINT:  Shortness of breath and high blood sugar.   HISTORY OF PRESENT ILLNESS:  Shirley [name] is a 35 year old African  American female with history of asthma.  She was recently hospitalized  on January 13 and discharged on January 18 following acute respiratory  exacerbation and chest tightness.  She was to follow up with  Shirley [name] for Myoview study on February 4, which  she completed, results are unknown.  The patient came to the emergency  room yesterday afternoon secondary to shortness of breath.  She wants  nebulizer.  Gave her treatments and discharged home.  Upon arrival at  home, she had exacerbation of shortness of breath again with pain  involving the right hand and radiating to the chest causing chest  tightness.  She was somewhat diaphoretic and nauseous.  The patient  called EMS.  At the same time, she checked her blood sugar and it was  575.  The patient was brought to the emergency room.  She was nebulized  by EMS prior to arrival here.  The patient did not feel much better  until repeated nebulizer treatment.   She admits to having polyuria and increased thirst.  Urine blood glucose  is greater than 1000.  The patient has been on Lantus, and she claims  she is compliant with her medications.  Her last hemoglobin A1C on  January 14 was 8.4.   The patient is currently less short of breath and comfortable.   REVIEW OF SYSTEMS:  She denies fever.  She had upper respiratory  congestion with cough and sputum production about three days ago.  There  is no  vomiting, no diarrhea or constipation.  No weight loss, no  dysuria.  She has increased frequency of micturition and polyuria and  increased thirst.   PAST MEDICAL HISTORY:  1. Diabetes mellitus diagnosed about three years ago.  2. Asthma since the age of 36.  3. Hypertension.  4. Migraine headaches, obesity, depression.   PAST SURGICAL HISTORY:  Severe infection.   CURRENT MEDICATIONS:  1. Hydrochlorothiazide 25 mg daily.  2. Combivent MDI two puffs four times a day.  3. Lantus 12 units q.h.s.  4. Lisinopril, dose unknown.  5. Metformin 500 mg p.o. b.i.d.  6. Effexor, dose unknown.  7. Protonix 40 mg daily.   ALLERGIES:  No known drug allergies.   SOCIAL HISTORY:  The patient is unemployed.  She does not drink alcohol  or smoke cigarettes.  She lives with two daughters.  She has a  boyfriend.   FAMILY HISTORY:  Grandfather died of myocardial infarction.  One aunt  had history of CHF.  Family history of stroke in  great grandmother.   PHYSICAL EXAMINATION:  VITAL SIGNS:  Temperature 98.4, blood pressure  139/86, pulse 126, respirations 44, currently respiratory rate is 28, O2  saturation 99% on oxygen.  GENERAL:  The patient is comfortable, not in acute respiratory distress.  HEENT:  Normocephalic, atraumatic.  Pupils equal, round and reactive to  light.  Extraocular movements intact.  No elevated JVD.  No carotid  bruits.  LUNGS:  She has poor long expiratory rhonchi.  No crackles.  CVS:  S1, S2 regular.  No murmurs.  ABDOMEN:  Obese, soft, nontender, bowel sounds present.  No  hepatomegaly.  EXTREMITIES:  No pedal edema, calf tenderness.  Dorsalis pedis pulses 2+  bilaterally.  CNS:  No focal neurological deficits.   LABORATORY DATA:  Initially, sodium 131, potassium 4.4, chloride 100,  CO2 19, glucose 578, BUN 12, creatinine 0.99, calcium 9.1, white cells  18.8, hemoglobin 12.3, hematocrit 36.8, platelets 546, MCV 72, absolute  neutrophil count 14.2, ABG on room air,  pH 7.36, PCO2 30, PO2 82,  bicarbonate 16.9, O2 saturation 95%.  Urinalysis showed glucose of  greater than 1000, ketones 40.  Microscopy unremarkable.  Chest x-ray no  acute pulmonary disease.  CT angiogram of the chest showed no PE  centrally.   ASSESSMENT/PLAN:  Shirley [name] is a 35 year old African American female  presenting with asthma exacerbation and uncontrolled diabetes mellitus.   ADMISSION DIAGNOSES:  1. Acute asthma bronchitis exacerbation.  Will start nebulizer Xopenex      0.63 mg q.6 h and q.3 p.r.n.  The patient is tachycardic.  Solu-      Medrol 60 mg IV q.6 h.  Will initiate Singulair 10 mg daily.      Consultation will be given to start an inhaled steroid prior to      discharge.  2. Diabetes mellitus, uncontrolled, type 2 diabetes with polyuria and      increased thirst.  The patient is already on insulin therapy.  Will      increase Lantus to 20 units subcu daily.  She had IV contrast and      she will not be able to start Metformin for another 48 hours.  We      may need to further up titrate Lantus as deemed necessary.  She      will also be covered with NovoLog q.a.c. and q.h.s. using moderate      scale.  3. Chest tightness.  I believe this is secondary to bronchospasms.      Nevertheless, she will need to exclude coronary involvement.  The      patient had a stress test done recently with positive results of      this.  Will start cardiac enzymes q.h. x2.  4. Sinus tachycardia secondary to bronchospasm and dehydration.  The      patient will be hydrated and treat underlying cause.  CT angiogram      of the chest was negative for pulmonary embolism.  5. History of depression.  Will resume Effexor.  The patient does not      know dose.  Will start 150 mg daily.     Shirley [name], M.D.  Electronically Signed    MBB/MEDQ  D:  06/07/2006  T:  06/07/2006  Job:  [id number]

## 2010-09-09 NOTE — Discharge Summary (Signed)
Shirley [name], Shirley [name]              ACCOUNT NO.:  [id number]   MEDICAL RECORD NO.:  [id number]          PATIENT TYPE:  INP   LOCATION:  1438                         FACILITY:  [facility]   PHYSICIAN:  Herbie Saxon, MDDATE OF BIRTH:  1976-01-02   DATE OF ADMISSION:  07/16/2006  DATE OF DISCHARGE:  07/19/2006                               DISCHARGE SUMMARY   DISCHARGE DIAGNOSES:  1. Sepsis  2. Upper respiratory tract infection.  3. Diabetes mellitus  4. Hypertension  5. Morbid obesity  6. Bronchial asthma  7. History of coronary artery disease status post percutaneous      transluminal coronary angioplasty (PTCA).  8. Chest pain with pulmonary embolus and acute coronary syndrome ruled      out  9. Anemia   HOSPITAL COURSE:  This 35 year old African-American lady was admitted  with complaints of chest pain and difficulty breathing on exertion  associated with wheezing. Cardiac enzymes ruled out any acute coronary  event and the. Patient had a blood culture that was GPC in clusters and  she was started on IV Vancomycin on which she has improved considerably.  Culture came out Staph species, coagulase negative. Her white count is  staying at 12,000.  Clinically the patient is afebrile, not tachycardic.  The white count was 12,400 on admission.  Nasal congestion is improving.  Diabetes has been optimized by increasing the Lantus insulin to 50 units  at night and Novolog 12 units t.i.d.   CONDITION ON DISCHARGE:  She is being discharged home in stable  condition to followup with her primary care physician, Dr. Liliane Channel in a  week.   DISCHARGE DIET:  1800 calorie ADA low fat, low cholesterol, low sodium  heart healthy   ACTIVITY:  No restrictions.   DISPOSITION:  Home.   MEDICATIONS ON DISCHARGE:  1. Septra 160/800 mg one tablet b.i.d. for 5 days  2. Clindamycin 600 mg p.o. t.i.d. for 5 days  3. Albuterol 25 mg daily  4. Advair 250/50 one puff b.i.d.  5. Lantus insulin 56  units subcutaneous nightly  6. NovoLog insulin 12 units t.i.d. premeals.  7. Imdur 30 mg t.i.d.  8. Aspirin 81 mg p.o. daily  9. Combivent MDI 2 puffs q. 6 hours p.r.n.  10.Lopressor 25 mg b.i.d.  11.Nasonex one spray each nostril b.i.d..  12.Senokot tablets b.i.d.  13.Hemocyte Plus one tablet daily.  14.Mucinex 600 mg p.o. b.i.d. 5 days   PHYSICAL EXAMINATION:  GENERAL:  She is a young lady, obese not in acute  distress.  Pupils equal, reacting to light and accommodation. Oropharynx and  nasopharynx clear.  NECK:  Supple.  LUNGS:  Clear.  There is no cyanosis, clubbing, or dehydration. No evidence of JVD.  CHEST:  Clinically clear.  CARDIAC:  Heart sounds 1 and 2 regular.  ABDOMEN:  Truncal obesity, soft, nontender. No organomegaly.  EXTREMITIES:  Peripheral pulses present. No edema.   LABORATORY DATA:  Labs showed WBC is 12, hematocrit 33, platelet count  449,000.  Chemistries:  Glucose 110, sodium 141, potassium 3.5, chloride  of 105, sodium bicarbonate 27, BUN 7, creatinine 0.8.  Herbie Saxon, MD  Electronically Signed     MIO/MEDQ  D:  07/19/2006  T:  07/19/2006  Job:  [id number]

## 2010-09-09 NOTE — Cardiovascular Report (Signed)
NAMEPETER, DAQUILA              ACCOUNT NO.:  [id number]   MEDICAL RECORD NO.:  [id number]          PATIENT TYPE:  WOC   LOCATION:  WOC                          FACILITY:  [facility]   PHYSICIAN:  Vonna Kotyk R. Jacinto Halim, MD       DATE OF BIRTH:  11-Mar-1976   DATE OF PROCEDURE:  06/08/2006  DATE OF DISCHARGE:                            CARDIAC CATHETERIZATION   PROCEDURES PERFORMED:  1. Right heart catheterization.  2. Left heart catheterization including, 1) Left Ventriculography  2)      Selective right and left coronary artery.  3. Abdominal aortogram.  4. Right femoral angiography and closure of the right femoral arterial      access with Star close.   INDICATIONS:  Ms. Marasco is a 35 year old female with history of  diabetes, hypertension, hyperlipidemia and also morbid obesity who was  admitted to the hospital with recurrent chest discomfort.  She had  undergone stress Myoview which had revealed anterolateral wall ischemia.  Given her positive stress Myoview, she was brought to the cardiac lab to  evaluate her coronary anatomy.  Right heart catheterization was  performed to evaluate for shortness of breath and pulmonary extension.   Abdominal aortogram was performed to evaluate for  renal artery  sclerosis and renal artery stenosis.   HEMODYNAMIC RESULTS:  The right heart catheterization.  RA pressures  were 22/90 with a mean of 18 mmHg.   RV pressures were 45/50 with a end diastolic pressure of 19 mmHg.   PA pressures were 34/23 with a mean of 31 mmHg.   On the catheter wedge was 36/37 a mean of 31 mmHg.   PA saturation was 66% and FA saturation was 95%.   Cardiac output was 6.70 the cardiac index of 2.69 by Fick.   LEFT HEART CATHETERIZATION HEMODYNAMIC DATA:  Ventricular pressure  159/70, end diastolic pressure of 20 mmHg.  The aortic pressure 141/98  with a mean of 118 mmHg.  There is no pressure gradient across the  aortic valve.   ANGIOGRAPHIC DATA:  Left ventricle:   Left ventricular systolic function  was normal with ejection fraction of 65%.  There was no mitral  regurgitation.   Right coronary artery:  Right coronary artery was a large caliber vessel  with a dominant vessel.  It was smooth and normal.   Left main:  Left main is very short, smooth and normal.   Circumflex:  Circumflex is a large caliber vessel.  It is smooth and  normal.   LAD:  LAD is a large caliber vessel.  Gives origin to a large diagonal I  and moderate-sized diagonal II.  It wraps around the apex.  It is smooth  and normal.   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  They are widely patent.  The aortoiliac  bifurcation was widely patent.  There was no evidence of abdominal  aortic aneurysm.   FINDINGS:  1. Normal coronary arteries and normal LV systolic function.  No      mitral regurgitation.  2. Noncardiac chest pain.  3. Moderate pulmonary hypertension secondary to morbid obesity.  4. Uncontrolled hypertension secondary to obesity and diabetes.   RECOMMENDATIONS:  Evaluation for noncardiac cause of chest pain is  indicated.  Consider aggressive weight loss approach, if clinically  appropriate, i.e. bariatric-type surgery.  The patient will follow up  with [facility] and we will see her back on a p.r.n.  basis.   A total of 100  mL of contrast was utilized for diagnostic angiography.   DETAILS OF PROCEDURE:  The usual sterile precautions using a 6-French  right femoral artery access and a 7-French right femoral venous access,  left and right heart catheterization was performed.   A balloon-tip Swan-Ganz catheter was advanced through the venous sheath  and pulmonary catheter wedge was easily obtained.  PA saturation was  also obtained.  The hemodynamics were carefully analyzed, and the  catheter was then pulled out of body.   A 6-French multipurpose B2 catheter was advanced through  arterial sheath  into the ascending aorta over a J-wire.  The catheter was advanced to  the left ventricle and left ventricle pressure monitored.  Contrast  injection of the left ventricle was performed in the RAO projection.  Catheter was flushed with saline, pulled back into the ascending aorta  and pressure gradient was monitored.  Right coronary artery was engaged  subselectively initially and then selectively and angiography was  performed.  Then the left main coronary artery and selective angiography  was performed.  Then the catheter was pulled back in the abdominal aorta  and abdominal aortogram was performed.  Because of suspicion for mitral  regurgitation, a 6-French angled pigtail catheter was utilized to  perform left ventriculography in the RAO projection using 45 mL of  contrast at 15 mL per second injection.  Then the catheters were pulled  out of body in usual fashion.  Right femoral angiography was performed  through the arterial access sheath and access was closed with Star close  with excellent hemostasis.      Cristy Hilts. Jacinto Halim, MD  Electronically Signed     JRG/MEDQ  D:  06/08/2006  T:  06/08/2006  Job:  [id number]   cc:   Mobolaji B. [name], M.D.

## 2010-09-09 NOTE — Telephone Encounter (Signed)
Pt calls and states she has been to ED several times over the past few days w/ abd discomfort, N&V and needs f/u appt. She is transferred to scheduling for an appt.

## 2010-09-09 NOTE — Procedures (Signed)
Shirley [name], Shirley [name]              ACCOUNT NO.:  [id number]   MEDICAL RECORD NO.:  [id number]          PATIENT TYPE:  OUT   LOCATION:  SLEEP CENTER                 FACILITY:  [facility]   PHYSICIAN:  Clinton D. Maple Hudson, MD, FCCP, FACPDATE OF BIRTH:  1973/09/06   DATE OF STUDY:  07/11/2006                            NOCTURNAL POLYSOMNOGRAM   INDICATION FOR STUDY:  Hypersomnia with sleep apnea.   EPWORTH SLEEPINESS SCORE:  13/24. BMI 50, weight 323 pounds.   MEDICATIONS:  Home medications listed and reviewed.   SLEEP ARCHITECTURE:  Total sleep time short, 186 minutes with sleep  efficiency 47%.  Stage I was 6%, stage II 64%, stages III and IV absent,  REM 30% of total sleep time. Sleep latency 16 minutes, REM latency 62  minutes, awake after sleep onset 140 minutes. Arousal index 23.8. No  bedtime medication taken. She awakened at 1 a.m. and was awake until  nearly 3:15 a.m. and then awake again from about 4:45 a.m.   RESPIRATORY DATA:  Apnea/hypopnea index (AHI, RDI) 31.8 obstructive  events per hour indicating moderate obstructive sleep apnea/hypopnea  syndrome. There were 57 obstructive apnea's and 42 hypopnea's.  Events  were not positional.  REM AHI 103.9 per hour. The technician was unable  to apply CPAP titration by split protocol because of insufficient sleep  on this study night.   OXYGEN DATA:  Moderate to loud snoring with oxygen desaturation to a  nadir of 66%. Mean oxygen saturation through the study night was 96% on  room air.   CARDIAC DATA:  Sinus rhythm with PACs.   MOVEMENT-PARASOMNIA:  A total of 20 limb jerks were recorded of which 15  were associated with arousal or awakening for periodic limb movement  with arousal index of 4.8 per hour.   IMPRESSIONS-RECOMMENDATIONS:  1. Moderate obstructive sleep apnea/hypopnea syndrome, AHI 31.8 per      hour with non positional events. Moderate snoring and oxygen      desaturation to a nadir of 66%.  2. CPAP titration could  not be performed because of patient's      inability to maintain sleep. Consider return for CPAP titration      bringing a sleep medication if appropriate.  3. Short total sleep time with sustained intervals of wakefulness,      nonspecific.  4. Mild periodic limb movement with arousal syndrome 4.8 per hour.      Clinton D. Maple Hudson, MD, [facility], FACP  Diplomate, Biomedical engineer of Sleep Medicine  Electronically Signed     CDY/MEDQ  D:  07/15/2006 17:07:32  T:  07/15/2006 20:15:53  Job:  [id number]

## 2010-09-09 NOTE — H&P (Signed)
Shirley [name], Shirley [name]              ACCOUNT NO.:  [id number]   MEDICAL RECORD NO.:  [id number]          PATIENT TYPE:  EMS   LOCATION:  ED                           FACILITY:  [facility]   PHYSICIAN:  Hettie Holstein, D.O.    DATE OF BIRTH:  [birth year]   DATE OF ADMISSION:  07/16/2006  DATE OF DISCHARGE:                              HISTORY & PHYSICAL   (History is limited to what can be obtained from Shirley [name], who is a  fairly reasonable historian, however, medical records are not available  as the E-chart system and M-sat system heart down therefore I cannot  access information such as the patient's 2-D echocardiogram, recent  consultations and other reported information.  It is uncertain how long  this will continue and it is now 6:45 a.m. on July 16, 2006.   PRIMARY CARE PHYSICIAN:  [facility], Dr.  Liliane Channel.   CHIEF COMPLAINT:  Dyspnea on exertion and chest pain.   HISTORY OF PRESENT ILLNESS:  Shirley [name] is a very pleasant 35 year old  morbidly obese African American female with known history of coronary  artery disease status post PTCA within the past month through Dr. Jacinto Halim  and Dr. Gery Pray of [facility].  She apparently had  arrested twice requiring defibrillation, according to the patient.  She  had somewhat atypical presentation of angina several weeks prior.  In  any event, she stated that since returning from the hospital she had  some chronic dyspnea, however, this has become worse if she has flights  of stairs and she has to negotiate in her home and today, while she was  ascending stairs, she became acutely short of breath and her chest  tightness.  She she use her albuterol pump, at time of stairs she rested  for bed for a bit and took a bath.  After she finished, she stated that  she developed the squeezing chest pressure once again.  She used her  Advair nebulizer.  She then stated that she began to develop some flu-  like  illness associated with nausea but no vomiting or diarrhea.  Apparently she had very atypical features with her previous PMI within  the past month, unfortunately, I do not have the cardiac catheterization  report nor the 2-D echocardiogram due to limitations of our health  information system at this time.   PAST MEDICAL HISTORY:  Limited due to electronic health record and  health information system at [facility] entire system outage).  1. Known coronary artery disease status post PTCA by [facility].  2. Diagnosis of diabetes mellitus for the past 2 years.  3. Hypertension for 50 years.  4. Asthma.  5. Status post cesarean section x2.  6. She retains her appendix, gallbladder, uterus and ovaries.   MEDICATIONS:  That she is able to provide, she states that she cannot  recall all of them, but this is the best we can do at this time.  She  uses [facility]  on [address].  1. She states that she is on Plavix 75 mg daily.  2. Aspirin 81 mg daily.  3. Imdur 30 mg daily.  4. Lantus 50 units subcu nightly.  5. NovoLog 15 units t.i.d.  6. Lopressor 25 mg p.o. b.i.d.  7. Iron sulfate 325 mg b.i.d.  8. Avapro, she does not know the dose.  9. She uses her Advair twice daily, though she does not know the dose.   ALLERGIES:  NO KNOWN DRUG ALLERGIES.   SOCIAL HISTORY:  Denies tobacco or alcohol.  She has two children.  She  is divorced.  She is currently seeking a different living situation on a  single-level.  She does have tremendous amount of difficulty negotiating  stairs with recurrent shortness of breath and chest discomfort.   FAMILY HISTORY:  Her mother is age 27.  She has lupus.  She does not  know her father's history.   REVIEW OF SYSTEMS:  She has had on and off swelling of her lowers  extremities.  This sounds dependent.  She reports exertional dyspnea,  this is apparently her baseline but not to the  degree that she describes  today.  She is currently describing her chest pain now at a 6/10.  She  has had some nausea but no vomiting or diarrhea.  No dysuria, no  blackout spells.  She does report some chills, no fever.  She has had a  cough, nonproductive.   PHYSICAL EXAMINATION IN THE EMERGENCY DEPARTMENT:  VITAL SIGNS:  Her  blood pressure was 106/57, heart rate in the 120s.  She is afebrile.  HEENT:  Revealed head to be normocephalic, atraumatic.  Extraocular  muscles were intact.  NECK:  Supple, nontender.  No palpable thyromegaly or mass.  CARDIOVASCULAR:  Normal S1-S2, though tachycardiac.  LUNGS:  She exhibited scattered wheeze.  She exhibited a normal effort.  She was slightly tachypneic.  There was no dullness to percussion.  ABDOMEN:  Soft, obese, nontender.  No rebound or guarding.  EXTREMITIES:  No edema.  NEUROLOGIC:  Exam revealed her to be alert and oriented, answering  questions quite concisely.  No focal neuro deficits.   LABORATORY DATA:  WBC of 13.2, hemoglobin 11.7, platelet count 465.  Sodium 138, potassium 3.7, BUN 6, creatinine 0.97 and glucose 101.  AST/ALT 29/12.  Point of care markers were negative.   EKG revealed nonspecific T-wave abnormalities.   ASSESSMENT:  1. Chest pain with...  2. Known coronary artery disease status post percutaneous transluminal      coronary angioplasty within the past 30 days.  3. Morbid obesity.  4. Asthma.  5. Diabetes mellitus.  6. Hypertension.  7. Prior history of cardiac arrest.   PLAN:  At this time, we will pursue records from previous  hospitalization as well as a cardiology consultation and will continue  her home medications.  Will empirically initiate low-molecular weight  heparin at this time.  She does describe perhaps a touch of bronchitis.  I will initiate a dose of Avelox.  Will continue beta blocker, Plavix, aspirin and follow her on the telemetry floor and await further  information as well as further  input from cardiology.      Hettie Holstein, D.O.  Electronically Signed     ESS/MEDQ  D:  07/16/2006  T:  07/16/2006  Job:  [id number]   cc:   Cristy Hilts. Jacinto Halim, MD  Fax: [phone]   Dr. Liliane Channel

## 2010-09-12 ENCOUNTER — Ambulatory Visit: Payer: Medicaid Other | Admitting: Internal Medicine

## 2010-09-14 ENCOUNTER — Other Ambulatory Visit: Payer: Self-pay | Admitting: Internal Medicine

## 2010-09-14 ENCOUNTER — Other Ambulatory Visit: Payer: Self-pay | Admitting: *Deleted

## 2010-09-14 MED ORDER — INSULIN GLARGINE 100 UNIT/ML ~~LOC~~ SOLN: SUBCUTANEOUS | Status: DC

## 2010-09-14 MED ORDER — INSULIN GLARGINE 100 UNIT/ML ~~LOC~~ SOLN: 30.0000 [IU] | Freq: Every day | SUBCUTANEOUS | Status: DC

## 2010-09-14 NOTE — Telephone Encounter (Signed)
Pt has been using lantus solostar not the vials. I called the change into the drugstore.  Can you change in EPIC. She has pen needles and has been on solostar in centricity.

## 2010-09-16 NOTE — Telephone Encounter (Signed)
Medication has been changed in Epic.

## 2010-09-21 ENCOUNTER — Emergency Department (HOSPITAL_COMMUNITY)
Admission: EM | Admit: 2010-09-21 | Discharge: 2010-09-21 | Disposition: A | Discharge status: Home / Self Care | Payer: Medicaid Other | Attending: Emergency Medicine | Admitting: Emergency Medicine

## 2010-09-21 DIAGNOSIS — I252 Old myocardial infarction: Secondary | ICD-10-CM | POA: Insufficient documentation

## 2010-09-21 DIAGNOSIS — R1032 Left lower quadrant pain: Secondary | ICD-10-CM | POA: Insufficient documentation

## 2010-09-21 DIAGNOSIS — I10 Essential (primary) hypertension: Secondary | ICD-10-CM | POA: Insufficient documentation

## 2010-09-21 DIAGNOSIS — I2510 Atherosclerotic heart disease of native coronary artery without angina pectoris: Secondary | ICD-10-CM | POA: Insufficient documentation

## 2010-09-21 DIAGNOSIS — R112 Nausea with vomiting, unspecified: Secondary | ICD-10-CM | POA: Insufficient documentation

## 2010-09-21 DIAGNOSIS — Z8619 Personal history of other infectious and parasitic diseases: Secondary | ICD-10-CM | POA: Insufficient documentation

## 2010-09-21 LAB — DIFFERENTIAL
Basophils Absolute: 0 10*3/uL (ref 0.0–0.1)
Basophils Relative: 0 % (ref 0–1)
Eosinophils Absolute: 0.2 10*3/uL (ref 0.0–0.7)
Lymphs Abs: 3.5 10*3/uL (ref 0.7–4.0)
Monocytes Absolute: 0.9 10*3/uL (ref 0.1–1.0)
Neutro Abs: 10.7 10*3/uL — ABNORMAL HIGH (ref 1.7–7.7)

## 2010-09-21 LAB — COMPREHENSIVE METABOLIC PANEL
ALT: 6 U/L (ref 0–35)
Albumin: 3.7 g/dL (ref 3.5–5.2)
Alkaline Phosphatase: 65 U/L (ref 39–117)
BUN: 12 mg/dL (ref 6–23)
Chloride: 98 mEq/L (ref 96–112)
Glucose, Bld: 146 mg/dL — ABNORMAL HIGH (ref 70–99)
Potassium: 3.7 mEq/L (ref 3.5–5.1)
Total Bilirubin: 0.4 mg/dL (ref 0.3–1.2)

## 2010-09-21 LAB — CBC
Hemoglobin: 10.3 g/dL — ABNORMAL LOW (ref 12.0–15.0)
MCH: 19.4 pg — ABNORMAL LOW (ref 26.0–34.0)

## 2010-09-21 LAB — LIPASE, BLOOD: Lipase: 38 U/L (ref 11–59)

## 2010-09-21 NOTE — Telephone Encounter (Signed)
closed

## 2010-09-22 LAB — URINALYSIS, ROUTINE W REFLEX MICROSCOPIC
Bilirubin Urine: NEGATIVE
Hgb urine dipstick: NEGATIVE
Protein, ur: NEGATIVE mg/dL
Urobilinogen, UA: 1 mg/dL (ref 0.0–1.0)

## 2010-09-22 LAB — URINE MICROSCOPIC-ADD ON

## 2010-09-22 LAB — POCT PREGNANCY, URINE: Preg Test, Ur: NEGATIVE

## 2010-09-27 ENCOUNTER — Ambulatory Visit (INDEPENDENT_AMBULATORY_CARE_PROVIDER_SITE_OTHER): Payer: Medicaid Other | Admitting: Internal Medicine

## 2010-09-27 ENCOUNTER — Encounter: Payer: Self-pay | Admitting: Internal Medicine

## 2010-09-27 VITALS — BP 135/98 | HR 102 | Temp 99.0°F | Ht 67.0 in | Wt 237.9 lb

## 2010-09-27 DIAGNOSIS — E119 Type 2 diabetes mellitus without complications: Secondary | ICD-10-CM

## 2010-09-27 DIAGNOSIS — R112 Nausea with vomiting, unspecified: Secondary | ICD-10-CM

## 2010-09-27 DIAGNOSIS — I259 Chronic ischemic heart disease, unspecified: Secondary | ICD-10-CM

## 2010-09-27 LAB — POCT GLYCOSYLATED HEMOGLOBIN (HGB A1C): Hemoglobin A1C: 8.7

## 2010-09-27 NOTE — Assessment & Plan Note (Addendum)
The patient has had extensive workup for this in the past. 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 She's been off of Reglan for 2-3 weeks. Unfortunately she did not follow up with Dr. Russella Dar after her last hospital discharge. We were able today to get her followup appointment on July 17 at the [facility] gastroenterology. She is to followup with them and not take any Reglan for the 4 weeks prior to that evaluation. It was repeatedly stressed to the patient that she should not miss that appointment or if she were in the hospital in the emergency room that she should call the clinic to cancel or reschedule her appointments. She voiced understanding of this.  I explained to the patient that I would not be offering her any narcotic pain medication as I felt this is counterproductive. Indicates a son with diabetic gastroparesis giving them a medication that would slow down there got is contraindicated. She expresses great frustration that I could not offer her any pain medicine. I told her that she could try high dose Tylenol on an as-needed basis. She remains skeptical that this would help.  It appeared to me today that she was well hydrated. I felt comfortable discharging her home as she did not have active vomiting today and was able to keep down liquids. I instructed her on hydration and to start trying to eat very small quantities of bland foods.  I left Percocet on her medicine list so the next physician we'll know that the patient has this. However I stressed to her that this medication is not treat her problem and that I recommend she not continue to take it

## 2010-09-27 NOTE — Progress Notes (Signed)
Subjective:    Patient ID: Shirley [name], female    DOB: [birth year], 35 y.o.   MRN: [id number]  HPI Comments: Patient presents with complaint of "same abdominal pain and nausea vomiting I have been battling since November" 2011.  Missed her follow-up appointment with Dr. Russella Dar of [facility] after last discharge in April 2011.  Patient states she was in the emergency room at that time being evaluated for nausea and vomiting. She states she did not call Dr. Anselm Jungling office to inform them as to the reason why she missed her appointment.  Last week patient was vomiting with abdominal pain all week, in and out of the emergency room.  They would give her fluids, phenergan and pain medicine  For her abdominal pain (as the pain greatly worsens once she starts vomiting).  The ED physicians recommended that she seek follow-up with her PCP.  Vomitus is non-bloody, green and yellow with mucus.  Her main complaint today appears to be the pain. She states that she will continue to go to the emergency room as narcotic pain medicines are the only options for her to alleviate her pain.  She states that Neurontin, Toradol, other NSAIDs, clonazepam, and anti-emetics do not help with her pain at all. When she starts to vomit her abdominal pain becomes unbearable and she therefore calls EMS and was emergency room and received narcotic pain medicine. During her last emergency room visit she received 15 pills of Percocet which she said helped her pain.  Patient has not eaten anything today because of abdominal pain.  No vomiting today, though patient felt nauseous and took a phenergan in the AM.  No fevers, chills.  No cold symptoms.  Regular bowel movements (last one was yesterday).  No urinary complaints.  Tiny streaks of pale blood in mucus in vomitus.    Last time she was regularly taking reglan was 2-3 weeks ago.  Had one dose in ED last week.  She did stop taking the toradol.  She has continued to take neurontin for her  diabetic neuropathy (primarily in her feet).    Patient reports good CBGs, usually 130s-140s in the AM.    Pt lost ~70 pounds from 2009-2011, but weight has been relatively stable for past 8 months.         Review of Systems  Constitutional: Negative for fever and chills.  HENT: Negative for rhinorrhea.   Eyes: Negative for visual disturbance.  Respiratory: Negative for cough, choking and chest tightness.   Cardiovascular: Negative for chest pain, palpitations and leg swelling.  Genitourinary: Negative for dysuria.  Musculoskeletal: Negative for gait problem.  Neurological: Positive for headaches. Negative for dizziness, tremors, weakness, light-headedness and numbness.  Psychiatric/Behavioral: Negative for behavioral problems.       Objective:   Physical Exam  Constitutional: She is oriented to person, place, and time. No distress.  HENT:  Head: Normocephalic and atraumatic.  Mouth/Throat: No oropharyngeal exudate.       Mucous membranes moist  Eyes: Conjunctivae and EOM are normal. Pupils are equal, round, and reactive to light.  Neck: Normal range of motion. Neck supple.  Cardiovascular: Normal rate, regular rhythm and intact distal pulses.   No murmur heard. Pulmonary/Chest: Effort normal and breath sounds normal. She has no wheezes.  Abdominal: Soft. Bowel sounds are normal. She exhibits no distension and no mass. There is no rebound and no guarding.       Mild diffuse abdominal tenderness, worse in LUQ  Musculoskeletal: Normal  range of motion. She exhibits no edema and no tenderness.  Neurological: She is alert and oriented to person, place, and time. No cranial nerve deficit. Coordination normal.  Skin: Skin is warm and dry. No rash noted.       Turgor normal  Psychiatric: She has a normal mood and affect. Her behavior is normal. Judgment and thought content normal.          Assessment & Plan:

## 2010-09-27 NOTE — Progress Notes (Signed)
Pt aware of appt with Dr Kaplan7/17/12 9AM - [facility] - pt aware to keep appt - this will be 2nd NS appt and will not see pt. Debr Meredith Mells RN 09/27/10 12:30PM

## 2010-09-27 NOTE — Assessment & Plan Note (Signed)
Patient history quit has very poor control of her diabetes. She had a long period of time during which her hemoglobin A1c was over 14%. Previous hemoglobin A1c is 7.8% today at 8.6%. I'm concerned she is moving in the wrong direction with this. We will have her record her blood sugars up to 4 times a day for the next 2 weeks. She will bring her meter in with her and we will follow up with her in 2 weeks and we can better adjust her insulin. It is imperative that we maximize her diabetic regimen as she is quite young and is already suffering multiple forms of peripheral disease related to her diabetes.

## 2010-09-27 NOTE — Patient Instructions (Addendum)
Please follow-up with [place] in 2 weeks. Until that time check your sugars up to 4 times a day. Bring your meter with you to clinic so we can adjust your insulin. You will be given an appointment with the GI physician at [facility] Keep taking your medicines as instructed.  Do not take reglan within 4 weeks of your GI appointment. For pain you can try tylenol as instructed on your medication list.

## 2010-10-05 ENCOUNTER — Emergency Department (HOSPITAL_COMMUNITY)
Admission: EM | Admit: 2010-10-05 | Discharge: 2010-10-06 | Disposition: A | Discharge status: Home / Self Care | Payer: Medicaid Other | Attending: Emergency Medicine | Admitting: Emergency Medicine

## 2010-10-05 ENCOUNTER — Emergency Department (HOSPITAL_COMMUNITY): Payer: Medicaid Other

## 2010-10-05 DIAGNOSIS — I2510 Atherosclerotic heart disease of native coronary artery without angina pectoris: Secondary | ICD-10-CM | POA: Insufficient documentation

## 2010-10-05 DIAGNOSIS — E119 Type 2 diabetes mellitus without complications: Secondary | ICD-10-CM | POA: Insufficient documentation

## 2010-10-05 DIAGNOSIS — R1032 Left lower quadrant pain: Secondary | ICD-10-CM | POA: Insufficient documentation

## 2010-10-05 DIAGNOSIS — R112 Nausea with vomiting, unspecified: Secondary | ICD-10-CM | POA: Insufficient documentation

## 2010-10-05 DIAGNOSIS — I10 Essential (primary) hypertension: Secondary | ICD-10-CM | POA: Insufficient documentation

## 2010-10-05 DIAGNOSIS — I252 Old myocardial infarction: Secondary | ICD-10-CM | POA: Insufficient documentation

## 2010-10-05 DIAGNOSIS — Z8619 Personal history of other infectious and parasitic diseases: Secondary | ICD-10-CM | POA: Insufficient documentation

## 2010-10-05 LAB — COMPREHENSIVE METABOLIC PANEL
Alkaline Phosphatase: 68 U/L (ref 39–117)
BUN: 9 mg/dL (ref 6–23)
CO2: 24 mEq/L (ref 19–32)
Chloride: 99 mEq/L (ref 96–112)
Creatinine, Ser: 0.61 mg/dL (ref 0.4–1.2)
GFR calc Af Amer: >60 mL/min (ref >60)
GFR calc non Af Amer: >60 mL/min (ref >60)
Glucose, Bld: 125 mg/dL — ABNORMAL HIGH (ref 70–99)
Total Bilirubin: 0.4 mg/dL (ref 0.3–1.2)

## 2010-10-05 LAB — DIFFERENTIAL
Basophils Relative: 0 % (ref 0–1)
Eosinophils Absolute: 0.2 10*3/uL (ref 0.0–0.7)
Eosinophils Relative: 1 % (ref 0–5)
Lymphocytes Relative: 23 % (ref 12–46)
Neutro Abs: 11.4 10*3/uL — ABNORMAL HIGH (ref 1.7–7.7)

## 2010-10-05 LAB — LIPASE, BLOOD: Lipase: 26 U/L (ref 11–59)

## 2010-10-05 LAB — CBC
HCT: 32.2 % — ABNORMAL LOW (ref 36.0–46.0)
Hemoglobin: 10.2 g/dL — ABNORMAL LOW (ref 12.0–15.0)
MCV: 61.3 fL — ABNORMAL LOW (ref 78.0–100.0)
RBC: 5.25 MIL/uL — ABNORMAL HIGH (ref 3.87–5.11)
RDW: 19.5 % — ABNORMAL HIGH (ref 11.5–15.5)
WBC: 17 10*3/uL — ABNORMAL HIGH (ref 4.0–10.5)

## 2010-10-05 LAB — GLUCOSE, CAPILLARY

## 2010-10-06 LAB — URINALYSIS, ROUTINE W REFLEX MICROSCOPIC
Hgb urine dipstick: NEGATIVE
Ketones, ur: 40 mg/dL — AB
Nitrite: NEGATIVE
Urobilinogen, UA: 2 mg/dL — ABNORMAL HIGH (ref 0.0–1.0)

## 2010-10-06 LAB — POCT PREGNANCY, URINE: Preg Test, Ur: NEGATIVE

## 2010-10-18 ENCOUNTER — Emergency Department (HOSPITAL_COMMUNITY)
Admission: EM | Admit: 2010-10-18 | Discharge: 2010-10-19 | Disposition: A | Discharge status: Home / Self Care | Payer: Medicaid Other | Attending: Emergency Medicine | Admitting: Emergency Medicine

## 2010-10-18 DIAGNOSIS — I252 Old myocardial infarction: Secondary | ICD-10-CM | POA: Insufficient documentation

## 2010-10-18 DIAGNOSIS — I10 Essential (primary) hypertension: Secondary | ICD-10-CM | POA: Insufficient documentation

## 2010-10-18 DIAGNOSIS — D649 Anemia, unspecified: Secondary | ICD-10-CM | POA: Insufficient documentation

## 2010-10-18 DIAGNOSIS — Z8619 Personal history of other infectious and parasitic diseases: Secondary | ICD-10-CM | POA: Insufficient documentation

## 2010-10-18 DIAGNOSIS — I2510 Atherosclerotic heart disease of native coronary artery without angina pectoris: Secondary | ICD-10-CM | POA: Insufficient documentation

## 2010-10-18 DIAGNOSIS — R109 Unspecified abdominal pain: Secondary | ICD-10-CM | POA: Insufficient documentation

## 2010-10-18 DIAGNOSIS — G8929 Other chronic pain: Secondary | ICD-10-CM | POA: Insufficient documentation

## 2010-10-18 DIAGNOSIS — E119 Type 2 diabetes mellitus without complications: Secondary | ICD-10-CM | POA: Insufficient documentation

## 2010-10-18 DIAGNOSIS — R112 Nausea with vomiting, unspecified: Secondary | ICD-10-CM | POA: Insufficient documentation

## 2010-10-18 DIAGNOSIS — IMO0001 Reserved for inherently not codable concepts without codable children: Secondary | ICD-10-CM | POA: Insufficient documentation

## 2010-10-18 LAB — URINALYSIS, ROUTINE W REFLEX MICROSCOPIC
Glucose, UA: 100 mg/dL — AB
Leukocytes, UA: NEGATIVE
Nitrite: NEGATIVE
Protein, ur: 100 mg/dL — AB
pH: 5.5 (ref 5.0–8.0)

## 2010-10-18 LAB — POCT PREGNANCY, URINE: Preg Test, Ur: NEGATIVE

## 2010-10-18 LAB — COMPREHENSIVE METABOLIC PANEL
Albumin: 3.3 g/dL — ABNORMAL LOW (ref 3.5–5.2)
Alkaline Phosphatase: 61 U/L (ref 39–117)
BUN: 14 mg/dL (ref 6–23)
CO2: 27 mEq/L (ref 19–32)
Chloride: 98 mEq/L (ref 96–112)
Creatinine, Ser: 0.82 mg/dL (ref 0.50–1.10)
GFR calc Af Amer: >60 mL/min (ref >60)
GFR calc non Af Amer: >60 mL/min (ref >60)
Glucose, Bld: 194 mg/dL — ABNORMAL HIGH (ref 70–99)
Potassium: 3.3 mEq/L — ABNORMAL LOW (ref 3.5–5.1)
Total Bilirubin: 0.3 mg/dL (ref 0.3–1.2)

## 2010-10-18 LAB — DIFFERENTIAL
Basophils Relative: 0 % (ref 0–1)
Eosinophils Absolute: 0.1 10*3/uL (ref 0.0–0.7)
Eosinophils Relative: 1 % (ref 0–5)
Lymphocytes Relative: 25 % (ref 12–46)
Neutro Abs: 6.6 10*3/uL (ref 1.7–7.7)
Neutrophils Relative %: 66 % (ref 43–77)

## 2010-10-18 LAB — CBC
HCT: 23.1 % — ABNORMAL LOW (ref 36.0–46.0)
Hemoglobin: 7.5 g/dL — ABNORMAL LOW (ref 12.0–15.0)
MCV: 62.1 fL — ABNORMAL LOW (ref 78.0–100.0)
RBC: 3.72 MIL/uL — ABNORMAL LOW (ref 3.87–5.11)
WBC: 10 10*3/uL (ref 4.0–10.5)

## 2010-10-18 LAB — LIPASE, BLOOD: Lipase: 32 U/L (ref 11–59)

## 2010-10-18 LAB — URINE MICROSCOPIC-ADD ON

## 2010-10-18 LAB — OCCULT BLOOD, POC DEVICE: Fecal Occult Bld: NEGATIVE

## 2010-11-01 ENCOUNTER — Emergency Department (HOSPITAL_COMMUNITY): Payer: Medicaid Other

## 2010-11-01 ENCOUNTER — Emergency Department (HOSPITAL_COMMUNITY)
Admission: EM | Admit: 2010-11-01 | Discharge: 2010-11-02 | Disposition: A | Discharge status: Home / Self Care | Payer: Medicaid Other | Attending: Emergency Medicine | Admitting: Emergency Medicine

## 2010-11-01 DIAGNOSIS — R197 Diarrhea, unspecified: Secondary | ICD-10-CM | POA: Insufficient documentation

## 2010-11-01 DIAGNOSIS — R112 Nausea with vomiting, unspecified: Secondary | ICD-10-CM | POA: Insufficient documentation

## 2010-11-01 DIAGNOSIS — I252 Old myocardial infarction: Secondary | ICD-10-CM | POA: Insufficient documentation

## 2010-11-01 DIAGNOSIS — Z794 Long term (current) use of insulin: Secondary | ICD-10-CM | POA: Insufficient documentation

## 2010-11-01 DIAGNOSIS — N390 Urinary tract infection, site not specified: Secondary | ICD-10-CM | POA: Insufficient documentation

## 2010-11-01 DIAGNOSIS — I10 Essential (primary) hypertension: Secondary | ICD-10-CM | POA: Insufficient documentation

## 2010-11-01 DIAGNOSIS — R109 Unspecified abdominal pain: Secondary | ICD-10-CM | POA: Insufficient documentation

## 2010-11-01 DIAGNOSIS — E119 Type 2 diabetes mellitus without complications: Secondary | ICD-10-CM | POA: Insufficient documentation

## 2010-11-01 LAB — BASIC METABOLIC PANEL
BUN: 14 mg/dL (ref 6–23)
CO2: 22 mEq/L (ref 19–32)
Calcium: 9.7 mg/dL (ref 8.4–10.5)
Chloride: 102 mEq/L (ref 96–112)
Creatinine, Ser: 1 mg/dL (ref 0.50–1.10)

## 2010-11-01 LAB — URINE MICROSCOPIC-ADD ON

## 2010-11-01 LAB — DIFFERENTIAL
Basophils Absolute: 0 10*3/uL (ref 0.0–0.1)
Eosinophils Relative: 0 % (ref 0–5)
Lymphs Abs: 0.7 10*3/uL (ref 0.7–4.0)
Monocytes Absolute: 1.1 10*3/uL — ABNORMAL HIGH (ref 0.1–1.0)
Monocytes Relative: 5 % (ref 3–12)
Neutro Abs: 20.3 10*3/uL — ABNORMAL HIGH (ref 1.7–7.7)

## 2010-11-01 LAB — URINALYSIS, ROUTINE W REFLEX MICROSCOPIC
Glucose, UA: 250 mg/dL — AB
Hgb urine dipstick: NEGATIVE
Protein, ur: 100 mg/dL — AB
Specific Gravity, Urine: 1.027 (ref 1.005–1.030)

## 2010-11-01 LAB — CBC
Hemoglobin: 10.4 g/dL — ABNORMAL LOW (ref 12.0–15.0)
MCH: 20.3 pg — ABNORMAL LOW (ref 26.0–34.0)
MCHC: 32.5 g/dL (ref 30.0–36.0)
MCV: 62.4 fL — ABNORMAL LOW (ref 78.0–100.0)
RBC: 5.13 MIL/uL — ABNORMAL HIGH (ref 3.87–5.11)

## 2010-11-04 ENCOUNTER — Emergency Department (HOSPITAL_COMMUNITY)
Admission: EM | Admit: 2010-11-04 | Discharge: 2010-11-04 | Disposition: A | Discharge status: Home / Self Care | Payer: Medicaid Other | Attending: Emergency Medicine | Admitting: Emergency Medicine

## 2010-11-04 DIAGNOSIS — I252 Old myocardial infarction: Secondary | ICD-10-CM | POA: Insufficient documentation

## 2010-11-04 DIAGNOSIS — Z794 Long term (current) use of insulin: Secondary | ICD-10-CM | POA: Insufficient documentation

## 2010-11-04 DIAGNOSIS — R112 Nausea with vomiting, unspecified: Secondary | ICD-10-CM | POA: Insufficient documentation

## 2010-11-04 DIAGNOSIS — I10 Essential (primary) hypertension: Secondary | ICD-10-CM | POA: Insufficient documentation

## 2010-11-04 DIAGNOSIS — R630 Anorexia: Secondary | ICD-10-CM | POA: Insufficient documentation

## 2010-11-04 DIAGNOSIS — R1032 Left lower quadrant pain: Secondary | ICD-10-CM | POA: Insufficient documentation

## 2010-11-04 DIAGNOSIS — E119 Type 2 diabetes mellitus without complications: Secondary | ICD-10-CM | POA: Insufficient documentation

## 2010-11-04 DIAGNOSIS — Z79899 Other long term (current) drug therapy: Secondary | ICD-10-CM | POA: Insufficient documentation

## 2010-11-04 LAB — CBC
HCT: 28 % — ABNORMAL LOW (ref 36.0–46.0)
Hemoglobin: 9.2 g/dL — ABNORMAL LOW (ref 12.0–15.0)
MCH: 20.2 pg — ABNORMAL LOW (ref 26.0–34.0)
MCHC: 32.9 g/dL (ref 30.0–36.0)
MCV: 61.4 fL — ABNORMAL LOW (ref 78.0–100.0)

## 2010-11-04 LAB — URINALYSIS, ROUTINE W REFLEX MICROSCOPIC
Bilirubin Urine: NEGATIVE
Glucose, UA: NEGATIVE mg/dL
Hgb urine dipstick: NEGATIVE
Ketones, ur: NEGATIVE mg/dL
Protein, ur: NEGATIVE mg/dL

## 2010-11-04 LAB — BASIC METABOLIC PANEL
Chloride: 102 mEq/L (ref 96–112)
GFR calc non Af Amer: >60 mL/min (ref >60)
Glucose, Bld: 112 mg/dL — ABNORMAL HIGH (ref 70–99)
Potassium: 3.3 mEq/L — ABNORMAL LOW (ref 3.5–5.1)
Sodium: 136 mEq/L (ref 135–145)

## 2010-11-04 LAB — URINE MICROSCOPIC-ADD ON

## 2010-11-08 ENCOUNTER — Ambulatory Visit: Payer: Medicaid Other | Admitting: Gastroenterology

## 2010-11-22 ENCOUNTER — Emergency Department (HOSPITAL_COMMUNITY): Payer: Medicaid Other

## 2010-11-22 ENCOUNTER — Emergency Department (HOSPITAL_COMMUNITY)
Admission: EM | Admit: 2010-11-22 | Discharge: 2010-11-23 | Disposition: A | Discharge status: Home / Self Care | Payer: Medicaid Other | Attending: Emergency Medicine | Admitting: Emergency Medicine

## 2010-11-22 DIAGNOSIS — E119 Type 2 diabetes mellitus without complications: Secondary | ICD-10-CM | POA: Insufficient documentation

## 2010-11-22 DIAGNOSIS — I10 Essential (primary) hypertension: Secondary | ICD-10-CM | POA: Insufficient documentation

## 2010-11-22 DIAGNOSIS — Z794 Long term (current) use of insulin: Secondary | ICD-10-CM | POA: Insufficient documentation

## 2010-11-22 DIAGNOSIS — I252 Old myocardial infarction: Secondary | ICD-10-CM | POA: Insufficient documentation

## 2010-11-22 DIAGNOSIS — G8929 Other chronic pain: Secondary | ICD-10-CM | POA: Insufficient documentation

## 2010-11-22 DIAGNOSIS — R112 Nausea with vomiting, unspecified: Secondary | ICD-10-CM | POA: Insufficient documentation

## 2010-11-22 DIAGNOSIS — R1032 Left lower quadrant pain: Secondary | ICD-10-CM | POA: Insufficient documentation

## 2010-11-22 LAB — POCT I-STAT, CHEM 8
BUN: 11 mg/dL (ref 6–23)
Calcium, Ion: 1.15 mmol/L (ref 1.12–1.32)
Chloride: 104 meq/L (ref 96–112)
Creatinine, Ser: 0.7 mg/dL (ref 0.50–1.10)
Glucose, Bld: 171 mg/dL — ABNORMAL HIGH (ref 70–99)
HCT: 28 % — ABNORMAL LOW (ref 36.0–46.0)
Hemoglobin: 9.5 g/dL — ABNORMAL LOW (ref 12.0–15.0)
Potassium: 4.3 meq/L (ref 3.5–5.1)
Sodium: 138 meq/L (ref 135–145)
TCO2: 27 mmol/L (ref 0–100)

## 2010-11-22 LAB — URINALYSIS, ROUTINE W REFLEX MICROSCOPIC
Bilirubin Urine: NEGATIVE
Glucose, UA: 250 mg/dL — AB
Hgb urine dipstick: NEGATIVE
Ketones, ur: NEGATIVE mg/dL
Leukocytes, UA: NEGATIVE
Nitrite: NEGATIVE
Protein, ur: NEGATIVE mg/dL
Specific Gravity, Urine: 1.018 (ref 1.005–1.030)
Urobilinogen, UA: 1 mg/dL (ref 0.0–1.0)
pH: 7.5 (ref 5.0–8.0)

## 2010-11-22 LAB — GLUCOSE, CAPILLARY: Glucose-Capillary: 217 mg/dL — ABNORMAL HIGH (ref 70–99)

## 2010-11-23 ENCOUNTER — Encounter: Payer: Medicaid Other | Admitting: Internal Medicine

## 2010-11-28 ENCOUNTER — Emergency Department (HOSPITAL_COMMUNITY)
Admission: EM | Admit: 2010-11-28 | Discharge: 2010-11-29 | Disposition: A | Discharge status: Home / Self Care | Payer: Medicaid Other | Attending: Emergency Medicine | Admitting: Emergency Medicine

## 2010-11-28 DIAGNOSIS — R112 Nausea with vomiting, unspecified: Secondary | ICD-10-CM | POA: Insufficient documentation

## 2010-11-28 DIAGNOSIS — R109 Unspecified abdominal pain: Secondary | ICD-10-CM | POA: Insufficient documentation

## 2010-11-28 DIAGNOSIS — I252 Old myocardial infarction: Secondary | ICD-10-CM | POA: Insufficient documentation

## 2010-11-28 DIAGNOSIS — R42 Dizziness and giddiness: Secondary | ICD-10-CM | POA: Insufficient documentation

## 2010-11-28 DIAGNOSIS — G8929 Other chronic pain: Secondary | ICD-10-CM | POA: Insufficient documentation

## 2010-11-28 LAB — POCT I-STAT, CHEM 8
HCT: 32 % — ABNORMAL LOW (ref 36.0–46.0)
Hemoglobin: 10.9 g/dL — ABNORMAL LOW (ref 12.0–15.0)
Sodium: 138 mEq/L (ref 135–145)
TCO2: 23 mmol/L (ref 0–100)

## 2010-12-06 ENCOUNTER — Emergency Department (HOSPITAL_COMMUNITY)
Admission: EM | Admit: 2010-12-06 | Discharge: 2010-12-06 | Disposition: A | Discharge status: Home / Self Care | Payer: Medicaid Other | Attending: Emergency Medicine | Admitting: Emergency Medicine

## 2010-12-06 DIAGNOSIS — E119 Type 2 diabetes mellitus without complications: Secondary | ICD-10-CM | POA: Insufficient documentation

## 2010-12-06 DIAGNOSIS — Z794 Long term (current) use of insulin: Secondary | ICD-10-CM | POA: Insufficient documentation

## 2010-12-06 DIAGNOSIS — R112 Nausea with vomiting, unspecified: Secondary | ICD-10-CM | POA: Insufficient documentation

## 2010-12-06 DIAGNOSIS — I252 Old myocardial infarction: Secondary | ICD-10-CM | POA: Insufficient documentation

## 2010-12-06 DIAGNOSIS — Z79899 Other long term (current) drug therapy: Secondary | ICD-10-CM | POA: Insufficient documentation

## 2010-12-06 DIAGNOSIS — R109 Unspecified abdominal pain: Secondary | ICD-10-CM | POA: Insufficient documentation

## 2010-12-06 DIAGNOSIS — R10819 Abdominal tenderness, unspecified site: Secondary | ICD-10-CM | POA: Insufficient documentation

## 2010-12-06 DIAGNOSIS — I10 Essential (primary) hypertension: Secondary | ICD-10-CM | POA: Insufficient documentation

## 2010-12-06 DIAGNOSIS — N390 Urinary tract infection, site not specified: Secondary | ICD-10-CM | POA: Insufficient documentation

## 2010-12-06 DIAGNOSIS — G8929 Other chronic pain: Secondary | ICD-10-CM | POA: Insufficient documentation

## 2010-12-06 DIAGNOSIS — R300 Dysuria: Secondary | ICD-10-CM | POA: Insufficient documentation

## 2010-12-06 DIAGNOSIS — R350 Frequency of micturition: Secondary | ICD-10-CM | POA: Insufficient documentation

## 2010-12-06 LAB — URINALYSIS, ROUTINE W REFLEX MICROSCOPIC
Ketones, ur: 15 mg/dL — AB
Nitrite: POSITIVE — AB
Urobilinogen, UA: 4 mg/dL — ABNORMAL HIGH (ref 0.0–1.0)

## 2010-12-06 LAB — COMPREHENSIVE METABOLIC PANEL
ALT: <5 U/L (ref 0–35)
AST: 10 U/L (ref 0–37)
Calcium: 9.2 mg/dL (ref 8.4–10.5)
Creatinine, Ser: 0.6 mg/dL (ref 0.50–1.10)
GFR calc Af Amer: >60 mL/min (ref >60)
GFR calc non Af Amer: >60 mL/min (ref >60)
Glucose, Bld: 160 mg/dL — ABNORMAL HIGH (ref 70–99)
Sodium: 136 mEq/L (ref 135–145)
Total Protein: 7.6 g/dL (ref 6.0–8.3)

## 2010-12-06 LAB — GLUCOSE, CAPILLARY: Glucose-Capillary: 228 mg/dL — ABNORMAL HIGH (ref 70–99)

## 2010-12-06 LAB — DIFFERENTIAL
Basophils Absolute: 0 10*3/uL (ref 0.0–0.1)
Eosinophils Absolute: 0.1 10*3/uL (ref 0.0–0.7)
Lymphs Abs: 3.3 10*3/uL (ref 0.7–4.0)
Monocytes Absolute: 0.9 10*3/uL (ref 0.1–1.0)
Neutro Abs: 10.2 10*3/uL — ABNORMAL HIGH (ref 1.7–7.7)

## 2010-12-06 LAB — URINE MICROSCOPIC-ADD ON

## 2010-12-06 LAB — CBC
MCH: 20 pg — ABNORMAL LOW (ref 26.0–34.0)
MCHC: 32.9 g/dL (ref 30.0–36.0)
MCV: 60.9 fL — ABNORMAL LOW (ref 78.0–100.0)
Platelets: 606 10*3/uL — ABNORMAL HIGH (ref 150–400)

## 2010-12-06 LAB — POCT PREGNANCY, URINE: Preg Test, Ur: NEGATIVE

## 2010-12-10 ENCOUNTER — Emergency Department (HOSPITAL_COMMUNITY)
Admission: EM | Admit: 2010-12-10 | Discharge: 2010-12-10 | Disposition: A | Discharge status: Home / Self Care | Payer: Medicaid Other | Attending: Emergency Medicine | Admitting: Emergency Medicine

## 2010-12-10 DIAGNOSIS — R112 Nausea with vomiting, unspecified: Secondary | ICD-10-CM | POA: Insufficient documentation

## 2010-12-10 DIAGNOSIS — R109 Unspecified abdominal pain: Secondary | ICD-10-CM | POA: Insufficient documentation

## 2010-12-10 DIAGNOSIS — R197 Diarrhea, unspecified: Secondary | ICD-10-CM | POA: Insufficient documentation

## 2010-12-10 LAB — URINALYSIS, ROUTINE W REFLEX MICROSCOPIC
Protein, ur: 30 mg/dL — AB
Specific Gravity, Urine: 1.011 (ref 1.005–1.030)
Urobilinogen, UA: 1 mg/dL (ref 0.0–1.0)

## 2010-12-10 LAB — LIPASE, BLOOD: Lipase: 25 U/L (ref 11–59)

## 2010-12-10 LAB — URINE MICROSCOPIC-ADD ON

## 2010-12-10 LAB — COMPREHENSIVE METABOLIC PANEL
Albumin: 3.3 g/dL — ABNORMAL LOW (ref 3.5–5.2)
Alkaline Phosphatase: 67 U/L (ref 39–117)
BUN: 7 mg/dL (ref 6–23)
Calcium: 9.7 mg/dL (ref 8.4–10.5)
Creatinine, Ser: 0.64 mg/dL (ref 0.50–1.10)
GFR calc Af Amer: >60 mL/min (ref >60)
Glucose, Bld: 135 mg/dL — ABNORMAL HIGH (ref 70–99)
Potassium: 3.2 mEq/L — ABNORMAL LOW (ref 3.5–5.1)
Total Protein: 8 g/dL (ref 6.0–8.3)

## 2010-12-10 LAB — DIFFERENTIAL
Basophils Relative: 0 % (ref 0–1)
Eosinophils Relative: 1 % (ref 0–5)
Lymphs Abs: 2.6 10*3/uL (ref 0.7–4.0)
Monocytes Absolute: 1 10*3/uL (ref 0.1–1.0)
Monocytes Relative: 7 % (ref 3–12)
Neutro Abs: 10.7 10*3/uL — ABNORMAL HIGH (ref 1.7–7.7)

## 2010-12-10 LAB — CBC
HCT: 28.7 % — ABNORMAL LOW (ref 36.0–46.0)
Hemoglobin: 9.2 g/dL — ABNORMAL LOW (ref 12.0–15.0)
MCH: 19.6 pg — ABNORMAL LOW (ref 26.0–34.0)
MCHC: 32.1 g/dL (ref 30.0–36.0)
MCV: 61.2 fL — ABNORMAL LOW (ref 78.0–100.0)
RDW: 19 % — ABNORMAL HIGH (ref 11.5–15.5)

## 2010-12-22 ENCOUNTER — Ambulatory Visit: Payer: Medicaid Other | Admitting: Gastroenterology

## 2010-12-28 ENCOUNTER — Telehealth: Payer: Self-pay | Admitting: Dietician

## 2010-12-28 NOTE — Telephone Encounter (Signed)
Patient has had many ED visits since last office visit here for nausea and vomiting.  Nausea and vomiting so bad she is eating very [place]. She has appointment this week with Dr. Arlyce Dice.   Transferred her to scheduler to schedule with her PCP and CDE.

## 2010-12-30 ENCOUNTER — Ambulatory Visit: Payer: Medicaid Other | Admitting: Gastroenterology

## 2011-01-04 ENCOUNTER — Encounter: Payer: Medicaid Other | Admitting: Internal Medicine

## 2011-01-12 LAB — COMPREHENSIVE METABOLIC PANEL
ALT: 12
AST: 40 — ABNORMAL HIGH
Albumin: 2.9 — ABNORMAL LOW
Alkaline Phosphatase: 68
Chloride: 102
Potassium: 4
Sodium: 132 — ABNORMAL LOW
Total Protein: 7.3

## 2011-01-12 LAB — CBC
HCT: 31.3 — ABNORMAL LOW
Hemoglobin: 11.3 — ABNORMAL LOW
Hemoglobin: 9.9 — ABNORMAL LOW
MCHC: 31.6
RBC: 5.45 — ABNORMAL HIGH
RBC: 4.74
RDW: 20.5 — ABNORMAL HIGH
RDW: 20.6 — ABNORMAL HIGH
WBC: 17 — ABNORMAL HIGH

## 2011-01-12 LAB — URINE MICROSCOPIC-ADD ON

## 2011-01-12 LAB — I-STAT 8, (EC8 V) (CONVERTED LAB)
Acid-base deficit: 1
BUN: 6
BUN: 10
Bicarbonate: 25.8 — ABNORMAL HIGH
Chloride: 104
Glucose, Bld: 309 — ABNORMAL HIGH
Glucose, Bld: 297 — ABNORMAL HIGH
HCT: 38
Hemoglobin: 12.9
Hemoglobin: 11.6 — ABNORMAL LOW
Operator id: 294501
Operator id: 151321
Potassium: 4
Potassium: 3.9
Sodium: 134 — ABNORMAL LOW
Sodium: 137
TCO2: 24
TCO2: 27
TCO2: 26
pCO2, Ven: 40.8 — ABNORMAL LOW
pH, Ven: 7.357 — ABNORMAL HIGH

## 2011-01-12 LAB — DIFFERENTIAL
Eosinophils Relative: 0
Lymphocytes Relative: 7 — ABNORMAL LOW
Monocytes Absolute: 0.9
Monocytes Relative: 5
Neutrophils Relative %: 88 — ABNORMAL HIGH
Smear Review: ADEQUATE

## 2011-01-12 LAB — URINALYSIS, ROUTINE W REFLEX MICROSCOPIC
Bilirubin Urine: NEGATIVE
Nitrite: NEGATIVE
Specific Gravity, Urine: >1.046 — ABNORMAL HIGH
Urobilinogen, UA: 1

## 2011-01-12 LAB — RAPID URINE DRUG SCREEN, HOSP PERFORMED
Barbiturates: NOT DETECTED
Benzodiazepines: NOT DETECTED

## 2011-01-12 LAB — BASIC METABOLIC PANEL
CO2: 25
Chloride: 102
Glucose, Bld: 288 — ABNORMAL HIGH
Potassium: 4
Sodium: 131 — ABNORMAL LOW

## 2011-01-12 LAB — D-DIMER, QUANTITATIVE
D-Dimer, Quant: 0.98 — ABNORMAL HIGH
D-Dimer, Quant: <0.22

## 2011-01-12 LAB — LIPID PANEL
Cholesterol: 157
LDL Cholesterol: 106 — ABNORMAL HIGH

## 2011-01-12 LAB — POCT CARDIAC MARKERS
CKMB, poc: <1 — ABNORMAL LOW
Operator id: 272551
Troponin i, poc: <0.05

## 2011-01-12 LAB — URINE CULTURE

## 2011-01-12 LAB — CK TOTAL AND CKMB (NOT AT ARMC)
CK, MB: 0.5
CK, MB: 0.5
Total CK: 78
Total CK: 92

## 2011-01-12 LAB — POCT I-STAT CREATININE
Operator id: 272551
Operator id: 151321

## 2011-01-12 LAB — B-NATRIURETIC PEPTIDE (CONVERTED LAB): Pro B Natriuretic peptide (BNP): <30

## 2011-01-12 LAB — TROPONIN I: Troponin I: 0.01

## 2011-01-12 LAB — TSH: TSH: 2.282

## 2011-01-19 LAB — BASIC METABOLIC PANEL
BUN: 2 — ABNORMAL LOW
BUN: 3 — ABNORMAL LOW
BUN: 4 — ABNORMAL LOW
BUN: 5 — ABNORMAL LOW
CO2: 22
CO2: 26
CO2: 29
CO2: 30
CO2: 31
Calcium: 7.5 — ABNORMAL LOW
Calcium: 8.5
Chloride: 103
Chloride: 105
Creatinine, Ser: 0.58
Creatinine, Ser: 0.69
Creatinine, Ser: 0.71
GFR calc Af Amer: >60
GFR calc Af Amer: >60
GFR calc Af Amer: >60
GFR calc Af Amer: >60
GFR calc non Af Amer: >60
GFR calc non Af Amer: >60
GFR calc non Af Amer: >60
GFR calc non Af Amer: >60
Glucose, Bld: 207 — ABNORMAL HIGH
Glucose, Bld: 293 — ABNORMAL HIGH
Glucose, Bld: 311 — ABNORMAL HIGH
Potassium: 3.3 — ABNORMAL LOW
Potassium: 3.5
Potassium: 3.6
Potassium: 3.6
Potassium: 3.7
Sodium: 135
Sodium: 135
Sodium: 136
Sodium: 138
Sodium: 141

## 2011-01-19 LAB — DIFFERENTIAL
Basophils Absolute: 0
Basophils Relative: 0
Eosinophils Absolute: 0.2
Lymphocytes Relative: 23
Monocytes Absolute: 1.6 — ABNORMAL HIGH
Neutrophils Relative %: 66

## 2011-01-19 LAB — LIPID PANEL
Cholesterol: 174
HDL: 27 — ABNORMAL LOW
LDL Cholesterol: 110 — ABNORMAL HIGH
Total CHOL/HDL Ratio: 6.4
Triglycerides: 183 — ABNORMAL HIGH

## 2011-01-19 LAB — CBC
HCT: 29.4 — ABNORMAL LOW
HCT: 29.5 — ABNORMAL LOW
HCT: 30.1 — ABNORMAL LOW
HCT: 31.9 — ABNORMAL LOW
HCT: 36.4
Hemoglobin: 10.3 — ABNORMAL LOW
Hemoglobin: 11.6 — ABNORMAL LOW
Hemoglobin: 9.6 — ABNORMAL LOW
Hemoglobin: 9.8 — ABNORMAL LOW
MCHC: 31.5
MCHC: 31.9
MCHC: 32.2
MCV: 66.5 — ABNORMAL LOW
MCV: 67.9 — ABNORMAL LOW
Platelets: 344
Platelets: 354
Platelets: 415 — ABNORMAL HIGH
Platelets: 458 — ABNORMAL HIGH
RBC: 4.34
RBC: 4.48
RBC: 4.59
RBC: 5.47 — ABNORMAL HIGH
RDW: 19.7 — ABNORMAL HIGH
RDW: 20 — ABNORMAL HIGH
WBC: 13 — ABNORMAL HIGH
WBC: 13.8 — ABNORMAL HIGH
WBC: 13.8 — ABNORMAL HIGH
WBC: 15.9 — ABNORMAL HIGH

## 2011-01-19 LAB — POCT CARDIAC MARKERS
CKMB, poc: <1 — ABNORMAL LOW
Operator id: 290111
Troponin i, poc: <0.05
Troponin i, poc: <0.05

## 2011-01-19 LAB — URINALYSIS, ROUTINE W REFLEX MICROSCOPIC
Bilirubin Urine: NEGATIVE
Glucose, UA: >1000 — AB
Leukocytes, UA: NEGATIVE
Protein, ur: NEGATIVE
pH: 6

## 2011-01-19 LAB — CARDIAC PANEL(CRET KIN+CKTOT+MB+TROPI)
CK, MB: 0.5
Relative Index: INVALID
Relative Index: INVALID
Total CK: 56
Total CK: 59

## 2011-01-19 LAB — COMPREHENSIVE METABOLIC PANEL
AST: 17
Albumin: 2.9 — ABNORMAL LOW
Alkaline Phosphatase: 68
Alkaline Phosphatase: 80
BUN: 7
BUN: 9
CO2: 25
Chloride: 101
Chloride: 90 — ABNORMAL LOW
Creatinine, Ser: 0.59
Creatinine, Ser: 1.06
GFR calc Af Amer: >60
GFR calc non Af Amer: 59 — ABNORMAL LOW
Glucose, Bld: 606
Potassium: 3.6
Potassium: 3.9
Total Bilirubin: 0.7
Total Protein: 6.8

## 2011-01-19 LAB — IRON AND TIBC
Iron: 18 — ABNORMAL LOW
TIBC: 337

## 2011-01-19 LAB — CRYPTOCOCCAL ANTIGEN: Crypto Ag: NEGATIVE

## 2011-01-19 LAB — URINE MICROSCOPIC-ADD ON

## 2011-01-19 LAB — CULTURE, BLOOD (ROUTINE X 2)
Culture: NO GROWTH
Culture: NO GROWTH

## 2011-01-19 LAB — PREGNANCY, URINE: Preg Test, Ur: NEGATIVE

## 2011-01-19 LAB — VITAMIN B12: Vitamin B-12: 407 (ref 211–911)

## 2011-01-19 LAB — HEMOGLOBIN A1C: Mean Plasma Glucose: 460

## 2011-01-19 LAB — MAGNESIUM: Magnesium: 1.8

## 2011-01-20 LAB — CBC
HCT: 34.2 — ABNORMAL LOW
Hemoglobin: 11.2 — ABNORMAL LOW
MCHC: 32.8
MCV: 65.4 — ABNORMAL LOW
Platelets: 457 — ABNORMAL HIGH
RBC: 5.23 — ABNORMAL HIGH
RDW: 20.2 — ABNORMAL HIGH
WBC: 23.6 — ABNORMAL HIGH

## 2011-01-20 LAB — DIFFERENTIAL
Basophils Absolute: 0.2 — ABNORMAL HIGH
Basophils Relative: 1
Eosinophils Absolute: 0.2
Eosinophils Relative: 1
Lymphocytes Relative: 11 — ABNORMAL LOW
Lymphs Abs: 2.6
Monocytes Absolute: 1.2 — ABNORMAL HIGH
Monocytes Relative: 5
Neutro Abs: 19.4 — ABNORMAL HIGH
Neutrophils Relative %: 82 — ABNORMAL HIGH

## 2011-01-20 LAB — COMPREHENSIVE METABOLIC PANEL
ALT: 9
AST: 11
Albumin: 2.9 — ABNORMAL LOW
Alkaline Phosphatase: 72
BUN: 8
CO2: 26
Calcium: 9.3
Chloride: 101
Creatinine, Ser: 0.67
GFR calc Af Amer: >60
GFR calc non Af Amer: >60
Glucose, Bld: 269 — ABNORMAL HIGH
Potassium: 3.9
Sodium: 135
Total Bilirubin: 0.7
Total Protein: 7.3

## 2011-01-20 LAB — GLUCOSE, CAPILLARY: Glucose-Capillary: 240 — ABNORMAL HIGH

## 2011-01-20 LAB — URINALYSIS, ROUTINE W REFLEX MICROSCOPIC
Bilirubin Urine: NEGATIVE
Glucose, UA: >1000 — AB
Glucose, UA: >1000 — AB
Hgb urine dipstick: NEGATIVE
Hgb urine dipstick: NEGATIVE
Ketones, ur: 15 — AB
Ketones, ur: 40 — AB
Nitrite: NEGATIVE
Protein, ur: 30 — AB
Protein, ur: NEGATIVE
Specific Gravity, Urine: 1.038 — ABNORMAL HIGH
Urobilinogen, UA: 1
pH: 5.5
pH: 6

## 2011-01-20 LAB — WET PREP, GENITAL
Trich, Wet Prep: NONE SEEN
Yeast Wet Prep HPF POC: NONE SEEN

## 2011-01-20 LAB — URINE MICROSCOPIC-ADD ON

## 2011-01-20 LAB — LIPASE, BLOOD: Lipase: 17

## 2011-01-20 LAB — GC/CHLAMYDIA PROBE AMP, GENITAL
Chlamydia, DNA Probe: NEGATIVE
GC Probe Amp, Genital: NEGATIVE

## 2011-01-20 LAB — PREGNANCY, URINE: Preg Test, Ur: NEGATIVE

## 2011-01-23 LAB — GLUCOSE, CAPILLARY

## 2011-01-24 LAB — DIFFERENTIAL
Eosinophils Absolute: 0.3
Lymphocytes Relative: 28
Lymphs Abs: 4.3 — ABNORMAL HIGH
Monocytes Relative: 8
Neutro Abs: 9.6 — ABNORMAL HIGH
Neutrophils Relative %: 62

## 2011-01-24 LAB — CBC
MCV: 68.7 — ABNORMAL LOW
Platelets: 404 — ABNORMAL HIGH
RBC: 5.2 — ABNORMAL HIGH
WBC: 12.9 — ABNORMAL HIGH

## 2011-01-24 LAB — BASIC METABOLIC PANEL
BUN: 10
Chloride: 98
Creatinine, Ser: 0.9
GFR calc Af Amer: >60
GFR calc non Af Amer: >60
Potassium: 4.3

## 2011-01-24 LAB — CULTURE, ROUTINE-ABSCESS: Gram Stain: NONE SEEN

## 2011-01-26 ENCOUNTER — Emergency Department (HOSPITAL_COMMUNITY): Payer: Medicaid Other

## 2011-01-26 ENCOUNTER — Emergency Department (HOSPITAL_COMMUNITY)
Admission: EM | Admit: 2011-01-26 | Discharge: 2011-01-26 | Disposition: A | Discharge status: Home / Self Care | Payer: Medicaid Other | Attending: Emergency Medicine | Admitting: Emergency Medicine

## 2011-01-26 DIAGNOSIS — R109 Unspecified abdominal pain: Secondary | ICD-10-CM | POA: Insufficient documentation

## 2011-01-26 DIAGNOSIS — E119 Type 2 diabetes mellitus without complications: Secondary | ICD-10-CM | POA: Insufficient documentation

## 2011-01-26 DIAGNOSIS — I498 Other specified cardiac arrhythmias: Secondary | ICD-10-CM | POA: Insufficient documentation

## 2011-01-26 DIAGNOSIS — I252 Old myocardial infarction: Secondary | ICD-10-CM | POA: Insufficient documentation

## 2011-01-26 DIAGNOSIS — I10 Essential (primary) hypertension: Secondary | ICD-10-CM | POA: Insufficient documentation

## 2011-01-26 DIAGNOSIS — R0682 Tachypnea, not elsewhere classified: Secondary | ICD-10-CM | POA: Insufficient documentation

## 2011-01-26 DIAGNOSIS — Z794 Long term (current) use of insulin: Secondary | ICD-10-CM | POA: Insufficient documentation

## 2011-01-26 DIAGNOSIS — G8929 Other chronic pain: Secondary | ICD-10-CM | POA: Insufficient documentation

## 2011-01-26 DIAGNOSIS — R112 Nausea with vomiting, unspecified: Secondary | ICD-10-CM | POA: Insufficient documentation

## 2011-01-26 LAB — HEPATIC FUNCTION PANEL
ALT: 5 U/L (ref 0–35)
AST: 14 U/L (ref 0–37)
Albumin: 3.5 g/dL (ref 3.5–5.2)
Alkaline Phosphatase: 75 U/L (ref 39–117)
Bilirubin, Direct: 0.1 mg/dL (ref 0.0–0.3)
Indirect Bilirubin: 0.4 mg/dL (ref 0.3–0.9)
Total Bilirubin: 0.5 mg/dL (ref 0.3–1.2)
Total Protein: 8.6 g/dL — ABNORMAL HIGH (ref 6.0–8.3)

## 2011-01-26 LAB — DIFFERENTIAL
Basophils Absolute: 0 10*3/uL (ref 0.0–0.1)
Basophils Relative: 0 % (ref 0–1)
Eosinophils Absolute: 0 10*3/uL (ref 0.0–0.7)
Lymphocytes Relative: 7 % — ABNORMAL LOW (ref 12–46)
Lymphs Abs: 1.2 10*3/uL (ref 0.7–4.0)
Monocytes Absolute: 0.3 10*3/uL (ref 0.1–1.0)
Neutro Abs: 15.3 10*3/uL — ABNORMAL HIGH (ref 1.7–7.7)

## 2011-01-26 LAB — CBC
HCT: 29.5 % — ABNORMAL LOW (ref 36.0–46.0)
MCH: 18.8 pg — ABNORMAL LOW (ref 26.0–34.0)
MCV: 59.5 fL — ABNORMAL LOW (ref 78.0–100.0)
Platelets: 468 10*3/uL — ABNORMAL HIGH (ref 150–400)
RDW: 18.9 % — ABNORMAL HIGH (ref 11.5–15.5)
WBC: 16.8 10*3/uL — ABNORMAL HIGH (ref 4.0–10.5)

## 2011-01-26 LAB — URINALYSIS, ROUTINE W REFLEX MICROSCOPIC
Glucose, UA: 500 mg/dL — AB
Hgb urine dipstick: NEGATIVE
Specific Gravity, Urine: 1.028 (ref 1.005–1.030)

## 2011-01-26 LAB — POCT I-STAT TROPONIN I

## 2011-01-26 LAB — GASTRIC OCCULT BLOOD (1-CARD TO LAB)
Occult Blood, Gastric: POSITIVE — AB
pH, Gastric: 1

## 2011-01-26 LAB — BASIC METABOLIC PANEL
BUN: 16 mg/dL (ref 6–23)
Calcium: 9.7 mg/dL (ref 8.4–10.5)
Chloride: 98 mEq/L (ref 96–112)
Creatinine, Ser: 0.73 mg/dL (ref 0.50–1.10)
GFR calc Af Amer: >90 mL/min (ref >90)

## 2011-01-27 LAB — URINE CULTURE
Colony Count: 10000
Culture  Setup Time: 201210041359

## 2011-01-31 LAB — DIFFERENTIAL
Basophils Absolute: 0
Eosinophils Absolute: 0.1 — ABNORMAL LOW
Eosinophils Relative: 1
Lymphs Abs: 3.3
Monocytes Absolute: 0.7
Neutrophils Relative %: 71

## 2011-01-31 LAB — URINALYSIS, ROUTINE W REFLEX MICROSCOPIC
Bilirubin Urine: NEGATIVE
Ketones, ur: NEGATIVE
Leukocytes, UA: NEGATIVE
Nitrite: NEGATIVE
Protein, ur: NEGATIVE
Urobilinogen, UA: 0.2
pH: 5.5

## 2011-01-31 LAB — COMPREHENSIVE METABOLIC PANEL
ALT: 12
Alkaline Phosphatase: 75
BUN: 10
CO2: 26
GFR calc non Af Amer: >60
Glucose, Bld: 478 — ABNORMAL HIGH
Potassium: 4.1
Total Protein: 7.8

## 2011-01-31 LAB — CBC
HCT: 32.9 — ABNORMAL LOW
Hemoglobin: 10.8 — ABNORMAL LOW
MCHC: 32.7
RBC: 5.12 — ABNORMAL HIGH
RDW: 20.4 — ABNORMAL HIGH

## 2011-01-31 LAB — PREGNANCY, URINE: Preg Test, Ur: NEGATIVE

## 2011-02-02 ENCOUNTER — Ambulatory Visit: Payer: Medicaid Other | Admitting: Internal Medicine

## 2011-02-03 LAB — BASIC METABOLIC PANEL
BUN: 11
CO2: 26
Calcium: 9.2
Chloride: 105
Creatinine, Ser: 0.89
GFR calc Af Amer: >60
Glucose, Bld: 142 — ABNORMAL HIGH

## 2011-02-03 LAB — DIFFERENTIAL
Basophils Absolute: 0
Basophils Relative: 0
Eosinophils Absolute: 0.2
Monocytes Absolute: 1 — ABNORMAL HIGH
Monocytes Relative: 6
Neutro Abs: 11.1 — ABNORMAL HIGH
Neutrophils Relative %: 73

## 2011-02-03 LAB — POCT CARDIAC MARKERS
CKMB, poc: <1 — ABNORMAL LOW
Operator id: 4074
Troponin i, poc: <0.05
Troponin i, poc: <0.05

## 2011-02-03 LAB — LIPASE, BLOOD: Lipase: 26

## 2011-02-03 LAB — CBC
MCHC: 33.2
MCV: 63.5 — ABNORMAL LOW
RBC: 5.09
RDW: 20.9 — ABNORMAL HIGH

## 2011-02-06 LAB — DIFFERENTIAL
Basophils Absolute: 0.1
Basophils Relative: 1
Lymphocytes Relative: 22
Neutro Abs: 9.3 — ABNORMAL HIGH
Neutrophils Relative %: 70

## 2011-02-06 LAB — POCT CARDIAC MARKERS
CKMB, poc: <1 — ABNORMAL LOW
Myoglobin, poc: 58
Operator id: 1415

## 2011-02-06 LAB — BASIC METABOLIC PANEL
Calcium: 8.8
GFR calc Af Amer: >60
GFR calc non Af Amer: >60
Sodium: 138

## 2011-02-06 LAB — B-NATRIURETIC PEPTIDE (CONVERTED LAB): Pro B Natriuretic peptide (BNP): <30

## 2011-02-06 LAB — CBC
MCHC: 33.5
RDW: 20.8 — ABNORMAL HIGH

## 2011-02-10 ENCOUNTER — Emergency Department (HOSPITAL_COMMUNITY)
Admission: EM | Admit: 2011-02-10 | Discharge: 2011-02-10 | Disposition: A | Discharge status: Home / Self Care | Payer: Medicaid Other | Attending: Emergency Medicine | Admitting: Emergency Medicine

## 2011-02-10 DIAGNOSIS — R112 Nausea with vomiting, unspecified: Secondary | ICD-10-CM | POA: Insufficient documentation

## 2011-02-10 DIAGNOSIS — Z79899 Other long term (current) drug therapy: Secondary | ICD-10-CM | POA: Insufficient documentation

## 2011-02-10 DIAGNOSIS — R109 Unspecified abdominal pain: Secondary | ICD-10-CM | POA: Insufficient documentation

## 2011-02-10 LAB — URINALYSIS, ROUTINE W REFLEX MICROSCOPIC
Glucose, UA: >1000 mg/dL — AB
Ketones, ur: 15 mg/dL — AB
Leukocytes, UA: NEGATIVE
Nitrite: NEGATIVE
Specific Gravity, Urine: 1.036 — ABNORMAL HIGH (ref 1.005–1.030)
pH: 5 (ref 5.0–8.0)

## 2011-02-10 LAB — COMPREHENSIVE METABOLIC PANEL
ALT: 6 U/L (ref 0–35)
BUN: 23 mg/dL (ref 6–23)
CO2: 23 mEq/L (ref 19–32)
Calcium: 9.8 mg/dL (ref 8.4–10.5)
Creatinine, Ser: 0.95 mg/dL (ref 0.50–1.10)
GFR calc Af Amer: 88 mL/min — ABNORMAL LOW (ref >90)
GFR calc non Af Amer: 76 mL/min — ABNORMAL LOW (ref >90)
Glucose, Bld: 344 mg/dL — ABNORMAL HIGH (ref 70–99)
Sodium: 136 mEq/L (ref 135–145)

## 2011-02-10 LAB — URINE MICROSCOPIC-ADD ON

## 2011-02-10 LAB — GLUCOSE, CAPILLARY
Glucose-Capillary: 337 mg/dL — ABNORMAL HIGH (ref 70–99)
Glucose-Capillary: 308 mg/dL — ABNORMAL HIGH (ref 70–99)

## 2011-02-10 LAB — LIPASE, BLOOD: Lipase: 25 U/L (ref 11–59)

## 2011-02-10 LAB — CBC
Hemoglobin: 9.9 g/dL — ABNORMAL LOW (ref 12.0–15.0)
MCH: 18.6 pg — ABNORMAL LOW (ref 26.0–34.0)
Platelets: 677 10*3/uL — ABNORMAL HIGH (ref 150–400)
RBC: 5.33 MIL/uL — ABNORMAL HIGH (ref 3.87–5.11)
WBC: 21.3 10*3/uL — ABNORMAL HIGH (ref 4.0–10.5)

## 2011-02-10 LAB — DIFFERENTIAL
Lymphocytes Relative: 14 % (ref 12–46)
Monocytes Absolute: 1.7 10*3/uL — ABNORMAL HIGH (ref 0.1–1.0)
Neutro Abs: 16.6 10*3/uL — ABNORMAL HIGH (ref 1.7–7.7)

## 2011-02-10 LAB — POCT PREGNANCY, URINE: Preg Test, Ur: NEGATIVE

## 2011-02-17 ENCOUNTER — Emergency Department (HOSPITAL_COMMUNITY)
Admission: EM | Admit: 2011-02-17 | Discharge: 2011-02-18 | Disposition: A | Discharge status: Home / Self Care | Payer: Medicaid Other | Attending: Emergency Medicine | Admitting: Emergency Medicine

## 2011-02-17 DIAGNOSIS — Z794 Long term (current) use of insulin: Secondary | ICD-10-CM | POA: Insufficient documentation

## 2011-02-17 DIAGNOSIS — R112 Nausea with vomiting, unspecified: Secondary | ICD-10-CM | POA: Insufficient documentation

## 2011-02-17 DIAGNOSIS — R109 Unspecified abdominal pain: Secondary | ICD-10-CM | POA: Insufficient documentation

## 2011-02-17 DIAGNOSIS — E119 Type 2 diabetes mellitus without complications: Secondary | ICD-10-CM | POA: Insufficient documentation

## 2011-02-17 LAB — GLUCOSE, CAPILLARY: Glucose-Capillary: 266 mg/dL — ABNORMAL HIGH (ref 70–99)

## 2011-02-18 LAB — DIFFERENTIAL
Basophils Absolute: 0 10*3/uL (ref 0.0–0.1)
Basophils Relative: 0 % (ref 0–1)
Lymphocytes Relative: 9 % — ABNORMAL LOW (ref 12–46)
Monocytes Absolute: 0.5 10*3/uL (ref 0.1–1.0)
Neutro Abs: 17.1 10*3/uL — ABNORMAL HIGH (ref 1.7–7.7)
Neutrophils Relative %: 88 % — ABNORMAL HIGH (ref 43–77)

## 2011-02-18 LAB — CBC
HCT: 30.1 % — ABNORMAL LOW (ref 36.0–46.0)
Hemoglobin: 9.3 g/dL — ABNORMAL LOW (ref 12.0–15.0)
RBC: 5.03 MIL/uL (ref 3.87–5.11)
WBC: 19.5 10*3/uL — ABNORMAL HIGH (ref 4.0–10.5)

## 2011-02-18 LAB — COMPREHENSIVE METABOLIC PANEL
ALT: 6 U/L (ref 0–35)
Alkaline Phosphatase: 70 U/L (ref 39–117)
BUN: 14 mg/dL (ref 6–23)
CO2: 24 mEq/L (ref 19–32)
Chloride: 100 mEq/L (ref 96–112)
GFR calc Af Amer: >90 mL/min (ref >90)
Glucose, Bld: 281 mg/dL — ABNORMAL HIGH (ref 70–99)
Potassium: 3.5 mEq/L (ref 3.5–5.1)
Sodium: 136 mEq/L (ref 135–145)
Total Bilirubin: 0.4 mg/dL (ref 0.3–1.2)

## 2011-02-18 LAB — LIPASE, BLOOD: Lipase: 43 U/L (ref 11–59)

## 2011-03-03 ENCOUNTER — Emergency Department (HOSPITAL_COMMUNITY): Payer: Medicaid Other

## 2011-03-03 ENCOUNTER — Encounter (HOSPITAL_COMMUNITY): Payer: Self-pay | Admitting: Family Medicine

## 2011-03-03 ENCOUNTER — Inpatient Hospital Stay (HOSPITAL_COMMUNITY)
Admission: EM | Admit: 2011-03-03 | Discharge: 2011-03-10 | DRG: 074 | Disposition: A | Discharge status: Home / Self Care | Payer: Medicaid Other | Attending: Infectious Diseases | Admitting: Infectious Diseases

## 2011-03-03 DIAGNOSIS — F1910 Other psychoactive substance abuse, uncomplicated: Secondary | ICD-10-CM

## 2011-03-03 DIAGNOSIS — E669 Obesity, unspecified: Secondary | ICD-10-CM | POA: Diagnosis present

## 2011-03-03 DIAGNOSIS — R112 Nausea with vomiting, unspecified: Secondary | ICD-10-CM

## 2011-03-03 DIAGNOSIS — I2510 Atherosclerotic heart disease of native coronary artery without angina pectoris: Secondary | ICD-10-CM | POA: Diagnosis present

## 2011-03-03 DIAGNOSIS — D72829 Elevated white blood cell count, unspecified: Secondary | ICD-10-CM | POA: Diagnosis present

## 2011-03-03 DIAGNOSIS — R7309 Other abnormal glucose: Secondary | ICD-10-CM

## 2011-03-03 DIAGNOSIS — Z9861 Coronary angioplasty status: Secondary | ICD-10-CM

## 2011-03-03 DIAGNOSIS — E1149 Type 2 diabetes mellitus with other diabetic neurological complication: Principal | ICD-10-CM | POA: Diagnosis present

## 2011-03-03 DIAGNOSIS — R739 Hyperglycemia, unspecified: Secondary | ICD-10-CM

## 2011-03-03 DIAGNOSIS — A499 Bacterial infection, unspecified: Secondary | ICD-10-CM | POA: Diagnosis present

## 2011-03-03 DIAGNOSIS — G4733 Obstructive sleep apnea (adult) (pediatric): Secondary | ICD-10-CM | POA: Diagnosis present

## 2011-03-03 DIAGNOSIS — R109 Unspecified abdominal pain: Secondary | ICD-10-CM

## 2011-03-03 DIAGNOSIS — D509 Iron deficiency anemia, unspecified: Secondary | ICD-10-CM

## 2011-03-03 DIAGNOSIS — I10 Essential (primary) hypertension: Secondary | ICD-10-CM | POA: Diagnosis present

## 2011-03-03 DIAGNOSIS — B181 Chronic viral hepatitis B without delta-agent: Secondary | ICD-10-CM | POA: Diagnosis present

## 2011-03-03 DIAGNOSIS — N760 Acute vaginitis: Secondary | ICD-10-CM | POA: Diagnosis present

## 2011-03-03 DIAGNOSIS — F1410 Cocaine abuse, uncomplicated: Secondary | ICD-10-CM | POA: Diagnosis present

## 2011-03-03 DIAGNOSIS — B9689 Other specified bacterial agents as the cause of diseases classified elsewhere: Secondary | ICD-10-CM | POA: Diagnosis present

## 2011-03-03 DIAGNOSIS — R42 Dizziness and giddiness: Secondary | ICD-10-CM | POA: Diagnosis present

## 2011-03-03 DIAGNOSIS — E876 Hypokalemia: Secondary | ICD-10-CM | POA: Diagnosis present

## 2011-03-03 DIAGNOSIS — K3184 Gastroparesis: Secondary | ICD-10-CM | POA: Diagnosis present

## 2011-03-03 DIAGNOSIS — IMO0001 Reserved for inherently not codable concepts without codable children: Secondary | ICD-10-CM | POA: Diagnosis present

## 2011-03-03 HISTORY — DX: Anxiety disorder, unspecified: F41.9

## 2011-03-03 HISTORY — DX: Acute myocardial infarction, unspecified: I21.9

## 2011-03-03 HISTORY — DX: Gastro-esophageal reflux disease without esophagitis: K21.9

## 2011-03-03 LAB — URINALYSIS, ROUTINE W REFLEX MICROSCOPIC
Ketones, ur: >80 mg/dL — AB
Leukocytes, UA: NEGATIVE
Nitrite: NEGATIVE
Protein, ur: 30 mg/dL — AB
Urobilinogen, UA: 0.2 mg/dL (ref 0.0–1.0)

## 2011-03-03 LAB — COMPREHENSIVE METABOLIC PANEL
AST: 15 U/L (ref 0–37)
Albumin: 3.9 g/dL (ref 3.5–5.2)
BUN: 11 mg/dL (ref 6–23)
CO2: 21 mEq/L (ref 19–32)
Calcium: 10.2 mg/dL (ref 8.4–10.5)
Chloride: 96 mEq/L (ref 96–112)
Creatinine, Ser: 0.6 mg/dL (ref 0.50–1.10)
GFR calc non Af Amer: >90 mL/min (ref >90)
Total Bilirubin: 0.4 mg/dL (ref 0.3–1.2)

## 2011-03-03 LAB — DIFFERENTIAL
Lymphocytes Relative: 7 % — ABNORMAL LOW (ref 12–46)
Lymphs Abs: 1.3 10*3/uL (ref 0.7–4.0)
Monocytes Relative: 3 % (ref 3–12)
Neutrophils Relative %: 90 % — ABNORMAL HIGH (ref 43–77)

## 2011-03-03 LAB — URINE MICROSCOPIC-ADD ON

## 2011-03-03 LAB — CBC
Hemoglobin: 11 g/dL — ABNORMAL LOW (ref 12.0–15.0)
MCH: 18.6 pg — ABNORMAL LOW (ref 26.0–34.0)
Platelets: 625 10*3/uL — ABNORMAL HIGH (ref 150–400)
RBC: 5.92 MIL/uL — ABNORMAL HIGH (ref 3.87–5.11)
WBC: 18.7 10*3/uL — ABNORMAL HIGH (ref 4.0–10.5)

## 2011-03-03 LAB — LIPASE, BLOOD: Lipase: 22 U/L (ref 11–59)

## 2011-03-03 MED ORDER — SODIUM CHLORIDE 0.9 % IV BOLUS (SEPSIS)
1000.0000 mL | Freq: Once | INTRAVENOUS | Status: AC
  Administered 2011-03-03: 1000 mL via INTRAVENOUS

## 2011-03-03 MED ORDER — ONDANSETRON HCL 4 MG/2ML IJ SOLN
4.0000 mg | Freq: Once | INTRAMUSCULAR | Status: AC
  Administered 2011-03-03: 4 mg via INTRAVENOUS
  Filled 2011-03-03: qty 2

## 2011-03-03 MED ORDER — HYDROMORPHONE HCL PF 1 MG/ML IJ SOLN
1.0000 mg | Freq: Once | INTRAMUSCULAR | Status: AC
  Administered 2011-03-03: 1 mg via INTRAVENOUS
  Filled 2011-03-03: qty 1

## 2011-03-03 MED ORDER — HYDROMORPHONE HCL PF 2 MG/ML IJ SOLN
2.0000 mg | Freq: Once | INTRAMUSCULAR | Status: AC
  Administered 2011-03-03: 2 mg via INTRAVENOUS
  Filled 2011-03-03: qty 1

## 2011-03-03 NOTE — ED Notes (Signed)
-   barcode on dilaudid 1 mg vial not worked

## 2011-03-03 NOTE — ED Notes (Signed)
Pt lying on side in stretcher shaking and groaning due to abdominal pain.

## 2011-03-03 NOTE — ED Provider Notes (Signed)
History     CSN: [id number] Arrival date & time: 03/03/2011  6:19 PM   First MD Initiated Contact with Patient 03/03/11 1935      Chief Complaint  Patient presents with  . Abdominal Pain    (Consider location/radiation/quality/duration/timing/severity/associated sxs/prior treatment) HPI Complains of diffuse abdominal pain for 2 days accompanied by multiple episodes of vomiting. Reports vomiting 15 times today. Last bowel movement yesterday normal no known fever pain is constant states she ran out of Percocet today. Has diffuse crampy in quality constant associated symptoms include nausea and vomiting no treatment prior to coming here Past Medical History  Diagnosis Date  . Thrombocytosis     Hem/Onc suggested 2/2 chronic hepatits and/or iron deficiency anemia  . Iron deficiency anemia   . N&V (nausea and vomiting)     and  Abdominal pain, chronic. Unclear ethiology with multiple admission and ED visits. Work up including Abdominal xray showed mostly nonspecific bowel gas pattern, CT abdomen with and without contrast (01/2010)  showed no acute process. Gastic Emptying scan (01/2010) was normal. Ultrasound of the abdomen was within normal limits. Hepatitis B viral load was undectable. HIV nonreactive. EGD (11/09/  . Abdominal pain   . Peripheral neuropathy   . Hypertension   . Hyperlipidemia   . Diabetes mellitus   . Recurrent boils   . Depression   . Fibromyalgia   . Back pain   . OSA (obstructive sleep apnea)   . 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 Cath 12/10- no restenosis or significant CAD progression  . Hepatitis B, chronic     Hep BeAb+,Hep B cAb+ & Hep BsAg+ (9/06)  . Irregular menses     Small ovarian follicles seen on CT(9/06)  . History of pyelonephritis     H/o GrpB Pyelonephritis (9/06) and UTI- 07/11- E.Coli, 12/10- GBS  . Abscess of tunica vaginalis    10/09- Abundant S. aureus- sensitive to all abx  . Obesity   . Asthma   . Gastritis     History reviewed. No pertinent past surgical history.  Family History  Problem Relation Age of Onset  . Diabetes Father     History  Substance Use Topics  . Smoking status: Former Smoker    Types: Cigarettes    Quit date: 04/24/1996  . Smokeless tobacco: Not on file  . Alcohol Use: Not on file    OB History    Grav Para Term Preterm Abortions TAB SAB Ect Mult Living                  Review of Systems  Constitutional: Negative.   HENT: Negative.   Respiratory: Negative.   Cardiovascular: Negative.   Gastrointestinal: Positive for nausea, vomiting and abdominal pain.  Musculoskeletal: Negative.   Skin: Negative.   Neurological: Negative.   Hematological: Negative.   Psychiatric/Behavioral: Negative.     Allergies  Lisinopril  Home Medications   Current Outpatient Rx  Name Route Sig Dispense Refill  . ACETAMINOPHEN 500 MG PO TABS Oral Take 1,000 mg by mouth every 6 (six) hours as needed. Pain.  Do not exceed 4000mg  in 24 hours.     Marland Kitchen ADVAIR DISKUS 100-50 MCG/DOSE IN AEPB  INHALE ONE PUFF TWO TIMES A DAY 1 each 3  . ASPIRIN 325 MG PO TABS Oral Take 325 mg by mouth daily.      Marland Kitchen  CLONAZEPAM 0.5 MG PO TABS Oral Take 0.5 mg by mouth daily.      Marland Kitchen CLOPIDOGREL BISULFATE 75 MG PO TABS Oral Take 75 mg by mouth daily.      Marland Kitchen FERROUS GLUCONATE 325 MG PO TABS Oral Take 325 mg by mouth daily with breakfast.      . GLUCOSE BLOOD VI STRP Other 1 each by Other route as needed. Use as instructed     . INSULIN GLARGINE 100 UNIT/ML [place] SOLN  Inject 30 Units into the skin at bedtime 10 mL 12  . INSULIN PEN NEEDLE 31G X 8 MM MISC Does not apply by Does not apply route.      Marland Kitchen METOPROLOL TARTRATE 25 MG PO TABS Oral Take 25 mg by mouth 2 (two) times daily.      Marland Kitchen OMEPRAZOLE 40 MG PO CPDR Oral Take 40 mg by mouth daily.      Marland Kitchen ONDANSETRON HCL 4 MG PO TABS Oral Take 4 mg by mouth every 8 (eight)  hours as needed.      . OXYCODONE-ACETAMINOPHEN 5-325 MG PO TABS Oral Take 1 tablet by mouth every 4 (four) hours as needed.      Marland Kitchen PRODIGY LANCETS 28G MISC Does not apply by Does not apply route. To inject Lantus as directed     . PROMETHAZINE HCL 12.5 MG PO TABS Oral Take 12.5 mg by mouth every 6 (six) hours as needed.        BP 166/100  Pulse 116  Temp(Src) 98.4 F (36.9 C) (Oral)  Resp 20  SpO2 100%  Physical Exam  Nursing note and vitals reviewed. Constitutional: She appears well-developed and well-nourished. She appears distressed.       Appears uncomfortable writhing on the bed  HENT:  Head: Normocephalic and atraumatic.  Eyes: Conjunctivae are normal. Pupils are equal, round, and reactive to light.  Neck: Neck supple. No tracheal deviation present. No thyromegaly present.  Cardiovascular: Normal rate and regular rhythm.   No murmur heard. Pulmonary/Chest: Effort normal and breath sounds normal.  Abdominal: Soft. She exhibits no distension. There is tenderness. There is no rebound and no guarding.       Diffusely tender morbidly obese  Musculoskeletal: Normal range of motion. She exhibits no edema and no tenderness.  Neurological: She is alert. Coordination normal.  Skin: Skin is warm and dry. No rash noted.  Psychiatric: She has a normal mood and affect.    ED Course  Procedures (including critical care time)  Labs Reviewed - No data to display No results found.   No diagnosis found. 12:15 AM patient still continues to complain of severe abdominal pain and nausea but looks more comfortable no longer writhing in bed after treatment with multiple doses of intravenous hydromorphone andantiemetics Results for orders placed during the hospital encounter of 03/03/11  COMPREHENSIVE METABOLIC PANEL      Component Value Range   Sodium 134 (*) 135 - 145 (mEq/L)   Potassium 3.7  3.5 - 5.1 (mEq/L)   Chloride 96  96 - 112 (mEq/L)   CO2 21  19 - 32 (mEq/L)   Glucose, Bld 299  (*) 70 - 99 (mg/dL)   BUN 11  6 - 23 (mg/dL)   Creatinine, Ser 1.61  0.50 - 1.10 (mg/dL)   Calcium 09.6  8.4 - 10.5 (mg/dL)   Total Protein 9.2 (*) 6.0 - 8.3 (g/dL)   Albumin 3.9  3.5 - 5.2 (g/dL)   AST 15  0 -  37 (U/L)   ALT 6  0 - 35 (U/L)   Alkaline Phosphatase 87  39 - 117 (U/L)   Total Bilirubin 0.4  0.3 - 1.2 (mg/dL)   GFR calc non Af Amer >90  >90 (mL/min)   GFR calc Af Amer >90  >90 (mL/min)  CBC      Component Value Range   WBC 18.7 (*) 4.0 - 10.5 (K/uL)   RBC 5.92 (*) 3.87 - 5.11 (MIL/uL)   Hemoglobin 11.0 (*) 12.0 - 15.0 (g/dL)   HCT 95.6 (*) 21.3 - 46.0 (%)   MCV 59.3 (*) 78.0 - 100.0 (fL)   MCH 18.6 (*) 26.0 - 34.0 (pg)   MCHC 31.3  30.0 - 36.0 (g/dL)   RDW 08.6 (*) 57.8 - 15.5 (%)   Platelets 625 (*) 150 - 400 (K/uL)  DIFFERENTIAL      Component Value Range   Neutrophils Relative 90 (*) 43 - 77 (%)   Neutro Abs 16.8 (*) 1.7 - 7.7 (K/uL)   Lymphocytes Relative 7 (*) 12 - 46 (%)   Lymphs Abs 1.3  0.7 - 4.0 (K/uL)   Monocytes Relative 3  3 - 12 (%)   Monocytes Absolute 0.5  0.1 - 1.0 (K/uL)   Eosinophils Relative 0  0 - 5 (%)   Eosinophils Absolute 0.0  0.0 - 0.7 (K/uL)   Basophils Relative 0  0 - 1 (%)   Basophils Absolute 0.0  0.0 - 0.1 (K/uL)   WBC Morphology TOXIC GRANULATION     RBC Morphology TARGET CELLS     Smear Review LARGE PLATELETS PRESENT    LIPASE, BLOOD      Component Value Range   Lipase 22  11 - 59 (U/L)  URINALYSIS, ROUTINE W REFLEX MICROSCOPIC      Component Value Range   Color, Urine YELLOW  YELLOW    Appearance TURBID (*) CLEAR    Specific Gravity, Urine >1.046 (*) 1.005 - 1.030    pH 5.0  5.0 - 8.0    Glucose, UA >1000 (*) NEGATIVE (mg/dL)   Hgb urine dipstick NEGATIVE  NEGATIVE    Bilirubin Urine NEGATIVE  NEGATIVE    Ketones, ur >80 (*) NEGATIVE (mg/dL)   Protein, ur 30 (*) NEGATIVE (mg/dL)   Urobilinogen, UA 0.2  0.0 - 1.0 (mg/dL)   Nitrite NEGATIVE  NEGATIVE    Leukocytes, UA NEGATIVE  NEGATIVE   URINE MICROSCOPIC-ADD ON       Component Value Range   Squamous Epithelial / LPF MANY (*) RARE    WBC, UA 3-6  <3 (WBC/hpf)   RBC / HPF 0-2  <3 (RBC/hpf)   Bacteria, UA MANY (*) RARE   PREGNANCY, URINE      Component Value Range   Preg Test, Ur NEGATIVE     Dg Abd Acute W/chest  03/03/2011  *RADIOLOGY REPORT*  Clinical Data: Cocaine withdrawal; diffuse chest and abdominal pain.  ACUTE ABDOMEN SERIES (ABDOMEN 2 VIEW & CHEST 1 VIEW)  Comparison: Chest and abdominal radiographs performed 01/26/2011  Findings: The lungs are well-aerated and clear.  There is no evidence of focal opacification, pleural effusion or pneumothorax. The cardiomediastinal silhouette is within normal limits.  The visualized bowel gas pattern is unremarkable.  Scattered stool and air are noted within the colon; there is no evidence of small bowel dilatation to suggest obstruction.  No free intra-abdominal air is identified on the provided upright view.  No acute osseous abnormalities are seen; the sacroiliac joints are unremarkable  in appearance.  IMPRESSION:  1.  Unremarkable bowel gas pattern; no free intra-abdominal air seen. 2.  No acute cardiopulmonary process identified.  Original Report Authenticated By: Tonia Ghent, M.D.    CRITICAL CARE Performed by: Doug Sou   Total critical care time: 30  Critical care time was exclusive of separately billable procedures and treating other patients.  Critical care was necessary to treat or prevent imminent or life-threatening deterioration.  Critical care was time spent personally by me on the following activities: development of treatment plan with patient and/or surrogate as well as nursing, discussions with consultants, evaluation of patient's response to treatment, examination of patient, obtaining history from patient or surrogate, ordering and performing treatments and interventions, ordering and review of laboratory studies, ordering and review of radiographic studies, pulse oximetry and  re-evaluation of patient's condition.  MDM  Plan transfer to [facility] as those familiar with her i.e. internal medicine teaching service are most familiar with her Diagnosis #1 intractable abdominal pain #2 hyperglycemia        Doug Sou, MD 03/04/11 [phone]

## 2011-03-03 NOTE — ED Notes (Addendum)
Per EMS: Pt c/o abdominal pain x2 days with N/V. Lives at home with family members. Reports CBG: 297

## 2011-03-04 ENCOUNTER — Encounter (HOSPITAL_COMMUNITY): Payer: Self-pay | Admitting: Cardiology

## 2011-03-04 ENCOUNTER — Inpatient Hospital Stay (HOSPITAL_COMMUNITY): Payer: Medicaid Other

## 2011-03-04 ENCOUNTER — Encounter: Payer: Self-pay | Admitting: Internal Medicine

## 2011-03-04 DIAGNOSIS — R7309 Other abnormal glucose: Secondary | ICD-10-CM

## 2011-03-04 DIAGNOSIS — R109 Unspecified abdominal pain: Secondary | ICD-10-CM

## 2011-03-04 LAB — LIPID PANEL
HDL: 50 mg/dL (ref >39)
LDL Cholesterol: 126 mg/dL — ABNORMAL HIGH (ref 0–99)
Total CHOL/HDL Ratio: 4.1 RATIO
Triglycerides: 140 mg/dL (ref <150)
VLDL: 28 mg/dL (ref 0–40)

## 2011-03-04 LAB — BASIC METABOLIC PANEL
BUN: 15 mg/dL (ref 6–23)
BUN: 11 mg/dL (ref 6–23)
CO2: 21 mEq/L (ref 19–32)
CO2: 22 mEq/L (ref 19–32)
CO2: 22 mEq/L (ref 19–32)
CO2: 24 mEq/L (ref 19–32)
Chloride: 100 mEq/L (ref 96–112)
Chloride: 102 mEq/L (ref 96–112)
Chloride: 105 mEq/L (ref 96–112)
Chloride: 104 mEq/L (ref 96–112)
Creatinine, Ser: 0.67 mg/dL (ref 0.50–1.10)
GFR calc Af Amer: >90 mL/min (ref >90)
GFR calc non Af Amer: >90 mL/min (ref >90)
Glucose, Bld: 311 mg/dL — ABNORMAL HIGH (ref 70–99)
Glucose, Bld: 222 mg/dL — ABNORMAL HIGH (ref 70–99)
Glucose, Bld: 195 mg/dL — ABNORMAL HIGH (ref 70–99)
Glucose, Bld: 253 mg/dL — ABNORMAL HIGH (ref 70–99)
Potassium: 3.8 mEq/L (ref 3.5–5.1)
Potassium: 3.7 mEq/L (ref 3.5–5.1)
Potassium: 3.9 mEq/L (ref 3.5–5.1)
Potassium: 3.6 mEq/L (ref 3.5–5.1)
Sodium: 137 mEq/L (ref 135–145)
Sodium: 135 mEq/L (ref 135–145)

## 2011-03-04 LAB — CBC
MCH: 18.6 pg — ABNORMAL LOW (ref 26.0–34.0)
MCHC: 31.1 g/dL (ref 30.0–36.0)
MCV: 59.7 fL — ABNORMAL LOW (ref 78.0–100.0)
Platelets: 545 10*3/uL — ABNORMAL HIGH (ref 150–400)
RDW: 19.4 % — ABNORMAL HIGH (ref 11.5–15.5)

## 2011-03-04 LAB — WET PREP, GENITAL
Trich, Wet Prep: NONE SEEN
Yeast Wet Prep HPF POC: NONE SEEN

## 2011-03-04 LAB — LACTIC ACID, PLASMA: Lactic Acid, Venous: 1.1 mmol/L (ref 0.5–2.2)

## 2011-03-04 LAB — RETICULOCYTES
RBC.: 5.05 MIL/uL (ref 3.87–5.11)
Retic Count, Absolute: 75.8 10*3/uL (ref 19.0–186.0)
Retic Ct Pct: 1.5 % (ref 0.4–3.1)

## 2011-03-04 LAB — GLUCOSE, CAPILLARY: Glucose-Capillary: 294 mg/dL — ABNORMAL HIGH (ref 70–99)

## 2011-03-04 LAB — RAPID URINE DRUG SCREEN, HOSP PERFORMED
Amphetamines: NOT DETECTED
Opiates: POSITIVE — AB

## 2011-03-04 LAB — FERRITIN: Ferritin: 11 ng/mL (ref 10–291)

## 2011-03-04 LAB — SEDIMENTATION RATE: Sed Rate: 36 mm/hr — ABNORMAL HIGH (ref 0–22)

## 2011-03-04 LAB — IRON AND TIBC: TIBC: 428 ug/dL (ref 250–470)

## 2011-03-04 LAB — HIV ANTIBODY (ROUTINE TESTING W REFLEX): HIV: NONREACTIVE

## 2011-03-04 MED ORDER — PROMETHAZINE HCL 25 MG/ML IJ SOLN
12.5000 mg | INTRAMUSCULAR | Status: DC | PRN
  Administered 2011-03-04 – 2011-03-09 (×11): 12.5 mg via INTRAVENOUS
  Filled 2011-03-04 (×12): qty 1

## 2011-03-04 MED ORDER — INSULIN ASPART 100 UNIT/ML ~~LOC~~ SOLN
0.0000 [IU] | SUBCUTANEOUS | Status: DC
  Administered 2011-03-04 (×2): 7 [IU] via SUBCUTANEOUS
  Filled 2011-03-04 (×2): qty 3

## 2011-03-04 MED ORDER — PANTOPRAZOLE SODIUM 40 MG PO TBEC
40.0000 mg | DELAYED_RELEASE_TABLET | Freq: Every day | ORAL | Status: DC
  Administered 2011-03-04 – 2011-03-08 (×4): 40 mg via ORAL
  Filled 2011-03-04 (×4): qty 1

## 2011-03-04 MED ORDER — INSULIN ASPART 100 UNIT/ML ~~LOC~~ SOLN: 0.0000 [IU] | Freq: Three times a day (TID) | SUBCUTANEOUS | Status: DC

## 2011-03-04 MED ORDER — METRONIDAZOLE 500 MG PO TABS
500.0000 mg | ORAL_TABLET | Freq: Two times a day (BID) | ORAL | Status: DC
  Administered 2011-03-04 – 2011-03-10 (×12): 500 mg via ORAL
  Filled 2011-03-04 (×14): qty 1

## 2011-03-04 MED ORDER — METOPROLOL TARTRATE 25 MG PO TABS
25.0000 mg | ORAL_TABLET | Freq: Two times a day (BID) | ORAL | Status: DC
  Administered 2011-03-04 – 2011-03-05 (×3): 25 mg via ORAL
  Filled 2011-03-04 (×6): qty 1

## 2011-03-04 MED ORDER — OXYCODONE-ACETAMINOPHEN 5-325 MG PO TABS
1.0000 | ORAL_TABLET | ORAL | Status: DC | PRN
Start: 2011-03-04 — End: 2011-03-07
  Administered 2011-03-04 – 2011-03-07 (×12): 1 via ORAL
  Filled 2011-03-04 (×14): qty 1

## 2011-03-04 MED ORDER — INSULIN ASPART 100 UNIT/ML ~~LOC~~ SOLN
0.0000 [IU] | Freq: Every day | SUBCUTANEOUS | Status: DC
  Administered 2011-03-04: 2 [IU] via SUBCUTANEOUS

## 2011-03-04 MED ORDER — ASPIRIN 325 MG PO TABS
325.0000 mg | ORAL_TABLET | Freq: Every day | ORAL | Status: DC
  Administered 2011-03-04 – 2011-03-10 (×6): 325 mg via ORAL
  Filled 2011-03-04 (×7): qty 1

## 2011-03-04 MED ORDER — FERROUS GLUCONATE 324 (38 FE) MG PO TABS
325.0000 mg | ORAL_TABLET | Freq: Two times a day (BID) | ORAL | Status: DC
  Administered 2011-03-04 – 2011-03-10 (×9): 324 mg via ORAL
  Filled 2011-03-04 (×16): qty 1

## 2011-03-04 MED ORDER — ONDANSETRON HCL 4 MG/2ML IJ SOLN: 4.0000 mg | Freq: Once | INTRAMUSCULAR | Status: DC

## 2011-03-04 MED ORDER — SODIUM CHLORIDE 0.9 % IV SOLN
INTRAVENOUS | Status: DC
  Administered 2011-03-04 – 2011-03-06 (×8): via INTRAVENOUS

## 2011-03-04 MED ORDER — SODIUM CHLORIDE 0.9 % IV BOLUS (SEPSIS)
1000.0000 mL | INTRAVENOUS | Status: AC
  Administered 2011-03-04 (×2): 1000 mL via INTRAVENOUS

## 2011-03-04 MED ORDER — ONDANSETRON HCL 4 MG/2ML IJ SOLN
INTRAMUSCULAR | Status: AC
  Administered 2011-03-04: 4 mg via INTRAVENOUS
  Filled 2011-03-04: qty 2

## 2011-03-04 MED ORDER — FLUTICASONE-SALMETEROL 100-50 MCG/DOSE IN AEPB
1.0000 | INHALATION_SPRAY | Freq: Two times a day (BID) | RESPIRATORY_TRACT | Status: DC
  Filled 2011-03-04: qty 14

## 2011-03-04 MED ORDER — MORPHINE SULFATE 2 MG/ML IJ SOLN
1.0000 mg | Freq: Once | INTRAMUSCULAR | Status: AC
  Administered 2011-03-04: 1 mg via INTRAVENOUS
  Filled 2011-03-04: qty 1

## 2011-03-04 MED ORDER — CLONAZEPAM 0.5 MG PO TABS
0.5000 mg | ORAL_TABLET | Freq: Three times a day (TID) | ORAL | Status: DC | PRN
  Administered 2011-03-05 – 2011-03-09 (×7): 0.5 mg via ORAL
  Filled 2011-03-04 (×7): qty 1

## 2011-03-04 MED ORDER — ONDANSETRON HCL 4 MG/2ML IJ SOLN: 4.0000 mg | Freq: Four times a day (QID) | INTRAMUSCULAR | Status: DC | PRN

## 2011-03-04 MED ORDER — HYDROMORPHONE HCL PF 1 MG/ML IJ SOLN
INTRAMUSCULAR | Status: AC
  Administered 2011-03-04: 1 mg via INTRAVENOUS
  Filled 2011-03-04: qty 1

## 2011-03-04 MED ORDER — ONDANSETRON HCL 4 MG/2ML IJ SOLN
4.0000 mg | Freq: Once | INTRAMUSCULAR | Status: AC
  Administered 2011-03-04: 4 mg via INTRAVENOUS

## 2011-03-04 MED ORDER — HYDROMORPHONE HCL PF 1 MG/ML IJ SOLN
1.0000 mg | Freq: Once | INTRAMUSCULAR | Status: AC
  Administered 2011-03-04: 1 mg via INTRAVENOUS

## 2011-03-04 MED ORDER — ENOXAPARIN SODIUM 40 MG/0.4ML ~~LOC~~ SOLN
40.0000 mg | Freq: Every day | SUBCUTANEOUS | Status: DC
  Administered 2011-03-04 – 2011-03-10 (×6): 40 mg via SUBCUTANEOUS
  Filled 2011-03-04 (×7): qty 0.4

## 2011-03-04 MED ORDER — IOHEXOL 300 MG/ML  SOLN
100.0000 mL | Freq: Once | INTRAMUSCULAR | Status: AC | PRN
  Administered 2011-03-04: 100 mL via INTRAVENOUS

## 2011-03-04 MED ORDER — INSULIN GLARGINE 100 UNIT/ML ~~LOC~~ SOLN
30.0000 [IU] | Freq: Every day | SUBCUTANEOUS | Status: DC
  Administered 2011-03-04 – 2011-03-05 (×2): 30 [IU] via SUBCUTANEOUS
  Filled 2011-03-04: qty 3

## 2011-03-04 MED ORDER — CLOPIDOGREL BISULFATE 75 MG PO TABS
75.0000 mg | ORAL_TABLET | Freq: Every day | ORAL | Status: DC
  Administered 2011-03-04 – 2011-03-10 (×6): 75 mg via ORAL
  Filled 2011-03-04 (×6): qty 1

## 2011-03-04 NOTE — Progress Notes (Addendum)
Subjective: Pt reports continued nausea this morning but no vomiting since last night in the [facility] ED. Abdominal pain is located in LLQ is a sharp, intermittent, 10/10, mildly improved since ED transfer, pain that began after the nausea and vomiting. Denies CP/SOB/HA/dizziness.  Minimal appetite, but requests pop sickle. Reports that zosyn does not alleviate nausea, and prefers phenergan. Pt cannot identify precipitating or related factors, such as association with food.   Objective: Vital signs in last 24 hours: Filed Vitals:   03/03/11 2133 03/04/11 0048 03/04/11 0242 03/04/11 0325  BP: 158/85 123/78 108/64 136/94  Pulse: 124 113  123  Temp: 98.4 F (36.9 C)   97.6 F (36.4 C)  TempSrc: Oral   Oral  Resp: 15 18 10 20   Height:    5\' 7"  (1.702 m)  Weight:    218 lb 11.1 oz (99.2 kg)  SpO2: 100% 99%  96%   Physical Exam: General: resting in bed, awake, NAD HEENT: PERRL, EOMI, no scleral icterus Cardiac: RRR, no rubs, murmurs or gallops Pulm: clear to auscultation bilaterally, moving normal volumes of air Abd: soft, nontender, nondistended, obese, hyperactive BS, no pain with deep auscultation, but reports tenderness to deep palpation of LLQ  Ext: warm and well perfused, no pedal edema Neuro: alert and oriented X3, cranial nerves II-XII grossly intact, strength and sensation to light touch equal in bilateral upper and lower extremities  Lab Results: Basic Metabolic Panel:  Lab 03/04/11 7829 03/04/11 0600  NA 136 137  K 3.7 3.8  CL 102 100  CO2 22 21  GLUCOSE 232* 311*  BUN 15 17  CREATININE 0.70 0.75  CALCIUM 9.1 9.7  MG -- --  PHOS -- --   Liver Function Tests:  Lab 03/03/11 2052  AST 15  ALT 6  ALKPHOS 87  BILITOT 0.4  PROT 9.2*  ALBUMIN 3.9    Lab 03/03/11 2052  LIPASE 22  AMYLASE --   CBC:  Lab 03/04/11 0600 03/03/11 2052  WBC 19.4* 18.7*  NEUTROABS -- 16.8*  HGB 9.9* 11.0*  HCT 31.8* 35.1*  MCV 59.7* 59.3*  PLT 545* 625*   CBG:  Lab  03/04/11 0739 03/04/11 0612 03/04/11 0325  GLUCAP 259* 294* 334*   Fasting Lipid Panel:  Lab 03/04/11 0857  CHOL 204*  HDL 50  LDLCALC 126*  TRIG 140  CHOLHDL 4.1  LDLDIRECT --   Anemia Panel:  Lab 03/04/11 0907  VITAMINB12 --  FOLATE --  FERRITIN --  TIBC --  IRON --  RETICCTPCT 1.5   Urine Drug Screen:  03/04/2011 09:04  Amphetamines NONE DETECTED  Barbiturates NONE DETECTED  Benzodiazepines NONE DETECTED  Opiates PENDING  COCAINE POSITIVE (A)  Tetrahydrocannabinol POSITIVE (A)    Ref. Range 03/04/2011 09:37  Lactic Acid, Venous  0.5-2.2 mmol/L 1.1    Ref. Range 03/04/2011 09:37  hCG, Beta Chain, Quant, S Latest Range: <5 mIU/mL <1   Micro Results: Recent Results (from the past 240 hour(s))  WET PREP, GENITAL     Status: Abnormal   Collection Time   03/04/11  6:42 AM      Component Value Range Status Comment   Yeast, Wet Prep NONE SEEN  NONE SEEN  Final    Trich, Wet Prep NONE SEEN  NONE SEEN  Final    Clue Cells, Wet Prep MANY (*) NONE SEEN  Final    WBC, Wet Prep HPF POC FEW (*) NONE SEEN  Final    Studies/Results: Dg Abd Acute W/chest  03/03/2011  *RADIOLOGY REPORT*  Clinical Data: Cocaine withdrawal; diffuse chest and abdominal pain.  ACUTE ABDOMEN SERIES (ABDOMEN 2 VIEW & CHEST 1 VIEW)  Comparison: Chest and abdominal radiographs performed 01/26/2011  Findings: The lungs are well-aerated and clear.  There is no evidence of focal opacification, pleural effusion or pneumothorax. The cardiomediastinal silhouette is within normal limits.  The visualized bowel gas pattern is unremarkable.  Scattered stool and air are noted within the colon; there is no evidence of small bowel dilatation to suggest obstruction.  No free intra-abdominal air is identified on the provided upright view.  No acute osseous abnormalities are seen; the sacroiliac joints are unremarkable in appearance.  IMPRESSION:  1.  Unremarkable bowel gas pattern; no free intra-abdominal air seen. 2.   No acute cardiopulmonary process identified.  Original Report Authenticated By: Tonia Ghent, M.D.   Medications: I have reviewed the patient's current medications. Scheduled Meds:   . aspirin  325 mg Oral Daily  . clopidogrel  75 mg Oral Q breakfast  . enoxaparin  40 mg Subcutaneous Daily  . ferrous gluconate  325 mg Oral BID WC  . Fluticasone-Salmeterol  1 puff Inhalation BID  .  HYDROmorphone (DILAUDID) injection  1 mg Intravenous Once  .  HYDROmorphone (DILAUDID) injection  1 mg Intravenous Once  .  HYDROmorphone (DILAUDID) injection  2 mg Intravenous Once  . insulin aspart  0-20 Units Subcutaneous Q4H  . insulin glargine  30 Units Subcutaneous QHS  . metoprolol tartrate  25 mg Oral BID  . metroNIDAZOLE  500 mg Oral Q12H  .  morphine injection  1 mg Intravenous Once  . ondansetron  4 mg Intravenous Once  . ondansetron  4 mg Intravenous Once  . ondansetron  4 mg Intravenous Once  . pantoprazole  40 mg Oral Q1200  . sodium chloride  1,000 mL Intravenous Once  . sodium chloride  1,000 mL Intravenous Q1 Hr x 2   . sodium chloride 200 mL/hr at 03/04/11 0748   PRN Meds:.clonazePAM, oxyCODONE-acetaminophen, promethazine, DISCONTD: ondansetron (ZOFRAN) IV  Assessment/Plan:  Pt is a 35yo woman with multiple comorbidities including fibromyalgia, depression, and treatment with H. pylori, presents with LLQ abdominal pain, nausea, and vomiting. Pt also reports vaginal itching and white discharge.  # Abdominal pain- Etiology is unclear at this time. She has had multiple GI workups in the past for similar symptoms with no underlying cause discerned. One possibility is DKA, as pt had ketones and glucose in her urine and an anion gap of 16 and leukocytosis.  Another possibility is an ovarian source. Urine pregnancy test (quantitative & qualitative) were negative, thus ectopic pregnancy is not in the differential. We will also perform a pelvic US to rule out ovarian cyst or torsion. PID is a  possibility and will be evaluated with GC/Chlamydia test, though patient did not experience cervical motion tenderness on physical exam. Diverticulitis (LLQ pain, leukocytosis) and SBO are other possibilities that will be assessed with abdominal CT.  Finally, patient's UDS was positive for cocaine, and thus abdominal pain may be 2/2 arterial vasospasms.   -Pain control with prn IV morphine while NPO, then prn Percocet  -N/V control with prn phenergan  -Abd CT 11/10  -BMET q4hr to observe anion gap progression  -Transvaginal US  -Await results of GC/Chlamydia test and lipase, will repeat UA as initial was dirty catch  #Vaginal Discharge: micro revealed clue cells, therefore will treat for bacterial vaginosis Plan: Metronidazole 500mg  PO BID  #Questionable AG metabolic  acidosis: No ABG was obtained, but BMET revealed AG of 16, which has now closed to 12, and bicarb is increasing.  Most likely 2/2 elevated glucose, patient likely experiencing mild DKA.  Lactate was wnl.  Will continue to monitor AG, and if it re-expands, will consider other possibilities such as ASA toxicity.    #. DM- Possible DKA. HbA1c in June 2012 = 8.7.  Patient notes compliance with home dose insulin.  She reports she is not taking metformin 2/2 nausea side effects.  Will continue to assess blood glucose levels and control with SSI.  -SSI and 30 units lantus qHS  -CBG q2hr, Will check HgA1c   -IVF at 200 cc/hr   #Polysubstance abuse: UDS positve for cocaine. Plan: social work consult  #. History of CAD- Stable on admission. No CP -Continue home ASA and Plavix  -Lipid panel suggests HLD, will start statin when pain symptoms resolve with goal LDL <100  # Depression/Anxiety- Stable on admission.  -Continue home Klonopin   # Anemia- Baseline Hgb 9-11, 11 on admission. Stable on admission, dropped to 9.9 this morning. Acute drop likely 2/2 IVF administration.  Chronic anemia likely due to iron deficiency complicated by  heavy menstrual bleeding and noncompliance with home iron supplements.  Chart review suggests Hemoglobin C, and electrophoresis in 2009, but no documentation of electrophoresis itself.  -Restart iron supplements once N/V resolves  -Daily CBC   # Leukocytosis- Present at baseline. WBC 18.7 on admission, 19.4 this morning. Will continue to monitor.  May be related to chronic hepatitis.  In Jan 2012, Dr. Darrold Span was consulted and was not convinced of CML at that time.  -Daily CBC -LAP, ESR, CRP with next blood draw  # History of thrombocytosis- Likely secondary to chronic inflammation and iron deficiency anemia. Will continue to monitor.  -Daily CBC   # HTN- Elevated this morning, patient has not been given antihypertensive medcation, will continue home meds.   #. History of Hep B- Stable. Will check Hep surface & e antigens    LOS: 1 day   KAPADIA, Raenell Mensing 03/04/2011, 10:38 AM

## 2011-03-04 NOTE — H&P (Addendum)
Internal Medicine On-call Attending Admission Note Date: 03/04/2011  Patient name: Shirley [name] Medical record number: [id number] Date of birth: 22-May-1975 Age: 35 y.o. Gender: female  I saw and evaluated the patient. I reviewed the resident's note and I agree with the resident's findings and plan as documented in the resident's note, with additional comments as noted below.  Chief Complaint(s): Left lower quadrant abdominal pain, nausea, vomiting  History - key components related to admission:  Patient is a 35 year old woman with a history of type 2 diabetes, hyperlipidemia, obstructive sleep apnea, hypertension, CAD status post PTCA, asthma, chronic leukocytosis, thrombocytosis, and chronic/recurrent abdominal pain admitted with left lower quadrant abdominal pain, nausea, and vomiting.  Patient denies fever, chills, chest pain, shortness of breath, hematemesis, diarrhea, melena, bright red blood per rectum, dysuria, or urinary frequency  Physical Exam - key components related to admission:  Filed Vitals:   03/03/11 2133 03/04/11 0048 03/04/11 0242 03/04/11 0325  BP: 158/85 123/78 108/64 136/94  Pulse: 124 113  123  Temp: 98.4 F (36.9 C)   97.6 F (36.4 C)  TempSrc: Oral   Oral  Resp: 15 18 10 20   Height:    5\' 7"  (1.702 m)  Weight:    218 lb 11.1 oz (99.2 kg)  SpO2: 100% 99%  96%   General appearance: alert and no distress Back: no CVA tenderness Lungs: clear to auscultation bilaterally Heart: regular, mildly tachycardic; S1-S2, no S3, no S4, no murmurs Abdomen: bowel sounds present, soft; moderate left lower quadrant and left lateral abdominal tenderness; no rebound; no hepatosplenomegaly Extremities: no edema  Lab results:  Basic Metabolic Panel:  Basename 03/04/11 0600 03/03/11 2052  NA 137 134*  K 3.8 3.7  CL 100 96  CO2 21 21  GLUCOSE 311* 299*  BUN 17 11  CREATININE 0.75 0.60  CALCIUM 9.7 10.2  MG -- --  PHOS -- --   Liver Function Tests:  [facility]  03/03/11 2052  AST 15  ALT 6  ALKPHOS 87  BILITOT 0.4  PROT 9.2*  ALBUMIN 3.9    Basename 03/03/11 2052  LIPASE 22  AMYLASE --    CBC:  Basename 03/04/11 0600 03/03/11 2052  WBC 19.4* 18.7*  NEUTROABS -- 16.8*  HGB 9.9* 11.0*  HCT 31.8* 35.1*  MCV 59.7* 59.3*  PLT 545* 625*    CBG:  Basename 03/04/11 0739 03/04/11 0612 03/04/11 0325  GLUCAP 259* 294* 334*    Urine pregnancy test: Negative  Urinalysis: Specific gravity 1.046, glucose greater than 1000, ketones greater than 80, protein 30, nitrite negative, leukocyte esterase negative, 3-6 white blood cells, 0-2 red blood cells, many squamous epithelial cells, many bacteria.  Imaging results:   03/03/2011 ACUTE ABDOMEN SERIES (ABDOMEN 2 VIEW & CHEST 1 VIEW)  Comparison: Chest and abdominal radiographs performed 01/26/2011  Findings: The lungs are well-aerated and clear.  There is no evidence of focal opacification, pleural effusion or pneumothorax. The cardiomediastinal silhouette is within normal limits.  The visualized bowel gas pattern is unremarkable.  Scattered stool and air are noted within the colon; there is no evidence of small bowel dilatation to suggest obstruction.  No free intra-abdominal air is identified on the provided upright view.  No acute osseous abnormalities are seen; the sacroiliac joints are unremarkable in appearance.  IMPRESSION:  1.  Unremarkable bowel gas pattern; no free intra-abdominal air seen. 2.  No acute cardiopulmonary process identified.    Assessment & Plan by Problem:  #1.  Abdominal pain, nausea, vomiting.  Patient presents with 2 day history of left lower quadrant abdominal pain, nausea, and vomiting of unclear etiology.  She has a history of chronic abdominal pain and reports that this pain is similar to prior chronic pain.  Her white blood count was elevated, but she has chronic leukocytosis.  Her urinalysis was a poor quality specimen and should be repeated along with urine for  culture.  The residents performed a pelvic exam and report no cervical motion or adnexal tenderness.  Plan is IV volume replacement; anti-emetics; CT scan of the abdomen and pelvis; repeat urinalysis, culture blood and urine; would have a low threshold for empiric antibiotics with intra-abdominal coverage if she is febrile or has any worsening of her symptoms.  GI consult may be needed.   #2.  Elevated anion gap/low bicarbonate.  This may represent diabetic ketoacidosis, although her blood sugar is not markedly elevated.  Plan is check ABG, and if acidosis is confirmed, workup with lactic acid level, salicylate level, serum acetone, and osmolality gap. #3.  Chronic leukocytosis.  Patient has chronic leukocytosis, and was evaluated by Dr. Darrold Span in November of 2011 for this.  This may be a reactive leukocytosis.  Would check ESR, C-reactive protein, LAP score, and would also reconsult hematology given the persistent leukocytosis without evident explanation. #4.  Type 2 diabetes.  As noted above, patient has an elevated anion gap and low bicarbonate, which may represent DKA although her blood sugar is markedly elevated.  Plan is to work this up as noted above, adjust insulin regimen as indicated.  Would check hemoglobin A1c. #5.  Hepatitis B.  Hepatitis serology in November of 2011 showed a positive surface antigen, positive core antibody, and negative surface antibody.  Would recheck hepatitis serology. #6.  Dr. Ninetta Lights will return on Monday, November 12.

## 2011-03-04 NOTE — H&P (Signed)
Hospital Admission Note Date: 03/04/2011  Patient name: Shirley [name] Medical record number: [id number] Date of birth: 1975/09/21 Age: 35 y.o. Gender: female PCP: Kristie Cowman, MD, MD  Medical Service: Internal Medicine Teaching Service  Attending physician:  Dr. Ninetta Lights    1st Contact: Dr. Milbert Coulter   Pager: [phone] 2nd Contact: Dr. Loistine Chance   Pager: [phone] After 5 pm or weekends: 1st Contact:      Pager: [phone] 2nd Contact:      Pager: [phone]  Chief Complaint:  Abdominal pain  History of Present Illness: Ms. Gritz is a 35 yo woman with PMH significant for fibromyalgia, polysubstance abuse and violated pain contract in clinic, DM, HTN, CAD status post subendocardial MI with angioplasty in February 2008 with a relook cath in February 2008 which showed patency of the site.  The patient had cath in December 2010 demonstrated no restenosis or significant coronary artery disease progression.  In addition, she also has multiple admissions for abdominal pain and N/V since 02/2010 with negative work-up with the most recent admission in April 2012.  She presents to the ED today for left lower abdominal pain, N/V x 2 days in duration with the last episode of Vomiting in the ED.  Denies any bloody vomitous or fever or chills.  She states that the pain is sharp, intermittent, 10/10 in severity, no radiation, exacerbation factor is N/V and is alleviated by Percocet and "laying on it, rocking".  Food does not aggravate her pain nor make it better.  Patient states that she saw Dr. Sharmon Leyden once as an outpatient but still does not know the reason why she continues to have abdominal pain, N/V. Per patient's report, she started having abdominal pain after she was diagnosed with H. Pylori in 02/2010 and was treated twice.   In addition, she endorses white vaginal discharge with some itching, increased in urinary frequency, but denies any burning sensation or dysuria. Her LMP was 02/08/2011 and reports  normal cycle except she has heavy menstrual bleeding.  She reports having 2 sexual partners without using protection.     Meds: Medications Prior to Admission  Medication Dose Route Frequency Provider Last Rate Last Dose  . HYDROmorphone (DILAUDID) injection 1 mg  1 mg Intravenous Once Doug Sou, MD   1 mg at 03/03/11 2145  . HYDROmorphone (DILAUDID) injection 1 mg  1 mg Intravenous Once Doug Sou, MD      . HYDROmorphone (DILAUDID) injection 2 mg  2 mg Intravenous Once Doug Sou, MD   2 mg at 03/03/11 2015  . ondansetron (ZOFRAN) injection 4 mg  4 mg Intravenous Once Doug Sou, MD   4 mg at 03/03/11 2015  . ondansetron (ZOFRAN) injection 4 mg  4 mg Intravenous Once Doug Sou, MD   4 mg at 03/03/11 2236  . ondansetron (ZOFRAN) injection 4 mg  4 mg Intravenous Once Doug Sou, MD      . sodium chloride 0.9 % bolus 1,000 mL  1,000 mL Intravenous Once Doug Sou, MD   1,000 mL at 03/03/11 2236   Medications Prior to Admission  Medication Sig Dispense Refill  . acetaminophen (TYLENOL) 500 MG tablet Take 1,000 mg by mouth every 6 (six) hours as needed. Pain.  Do not exceed 4000mg  in 24 hours.       Marland Kitchen ADVAIR DISKUS 100-50 MCG/DOSE AEPB INHALE ONE PUFF TWO TIMES A DAY  1 each  3  . aspirin 325 MG tablet Take 325 mg by mouth daily.        Marland Kitchen  clonazePAM (KLONOPIN) 0.5 MG tablet Take 0.5 mg by mouth 3 (three) times daily as needed. For anxiety.      . clopidogrel (PLAVIX) 75 MG tablet Take 75 mg by mouth daily.        . ferrous gluconate (FERGON) 325 MG tablet Take 325 mg by mouth daily with breakfast.        . insulin glargine (LANTUS SOLOSTAR) 100 UNIT/ML injection Inject 30 Units into the skin at bedtime  10 mL  12  . metoprolol tartrate (LOPRESSOR) 25 MG tablet Take 25 mg by mouth 2 (two) times daily.        Marland Kitchen omeprazole (PRILOSEC) 40 MG capsule Take 40 mg by mouth daily.        Marland Kitchen oxyCODONE-acetaminophen (PERCOCET) 5-325 MG per tablet Take 1 tablet by mouth every 4  (four) hours as needed. For pain.      . promethazine (PHENERGAN) 12.5 MG tablet Take 12.5 mg by mouth every 6 (six) hours as needed. For nausea/vomiting.      Marland Kitchen glucose blood test strip 1 each by Other route as needed. Use as instructed       . Insulin Pen Needle (AURORA PEN NEEDLES) 31G X 8 MM MISC by Does not apply route.        Marland Kitchen PRODIGY LANCETS 28G MISC by Does not apply route. To inject Lantus as directed         Allergies: Intolerant to Lisinopril: nausea  Past Medical History  Diagnosis Date  . Thrombocytosis     Hem/Onc suggested 2/2 chronic hepatits and/or iron deficiency anemia  . Iron deficiency anemia   . N&V (nausea and vomiting)     and  Abdominal pain, chronic. Unclear ethiology with multiple admission and ED visits. Work up including Abdominal xray showed mostly nonspecific bowel gas pattern, CT abdomen with and without contrast (01/2010)  showed no acute process. Gastic Emptying scan (01/2010) was normal. Ultrasound of the abdomen was within normal limits. Hepatitis B viral load was undectable. HIV nonreactive. EGD (11/09/  . Abdominal pain EGD showed mild gastritis on 03/02/2010 1. Duodenum, biopsy, :   - DUODENAL MUCOSA WITH VARIABLE VILLOUS BLUNTING, MILD PLASMA CELL LAMINA  2. Stomach, biopsy, body and antrum :   MODERATE CHRONIC ACTIVE H.PYLORI GASTRITIS (ANTRAL AND OXYNTIC MUCOSA).   . Peripheral neuropathy   . Hypertension   . Hyperlipidemia   . Diabetes mellitus   . Recurrent boils   . Depression   . Fibromyalgia   . Back pain   . OSA (obstructive sleep apnea)   . 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 Cath 12/10- no restenosis or significant CAD progression  . Hepatitis B, chronic     Hep BeAb+,Hep B cAb+ & Hep BsAg+ (9/06) Hep B surface Antigen +, Hep B surface antibody neg, Hep B DNA positive (02/2010)  . Irregular menses     Small ovarian  follicles seen on CT(9/06)  . History of pyelonephritis     H/o GrpB Pyelonephritis (9/06) and UTI- 07/11- E.Coli, 12/10- GBS  . Abscess of tunica vaginalis     10/09- Abundant S. aureus- sensitive to all abx  . Obesity   . Asthma   . Gastritis     FAMILY HISTORY:  Positive for diabetes, cardiac disease and lupus in her   mother and sister.  Her daughter is anemic.  She is  not aware of other   hemoglobin C, not aware of bleeding abnormalities or other hematologic   problems.   Social History: denies any smoking, drinks occasionally, denies any illicit drugs (remote history of marijuana and cocaine) She used to work as a Firefighter, now unemployed. Has medicaid. Has 2 healthy children  Review of Systems: Gen: general malaise Heart: no chestpain Lungs: so SOB Abdomen:left lower abdominal pain, + N/V, no diarrhea or constipation GU: white vaginal discharge with pruritis, increased urinary frequency, no dysuria, or burning sensation,  Ext:no edema Neuro: no weakness  Physical Exam: Blood pressure 123/78, pulse 113, temperature 98.4 F (36.9 C), temperature source Oral, resp. rate 18, SpO2 99.00%. General: alert, well-developed, and cooperative to examination.  Head: normocephalic and atraumatic.  Eyes: vision grossly intact, pupils equal, pupils round, pupils reactive to light, no injection and anicteric.  Mouth: pharynx pink and + dry mucous membrane, no erythema, and no exudates.  Neck: supple, full ROM, no thyromegaly, no JVD Lungs: normal respiratory effort, no accessory muscle use, normal breath sounds, no crackles, and no wheezes. Heart: tachycardic, regular rhythm, no murmur, no gallop, and no rub.  Abdomen: soft, +tender to left lower quadrant to palpation, hypoactive bowel sounds, no distention, no guarding, no rebound tenderness, no hepatomegaly, and no splenomegaly.  Msk: no joint swelling, no joint warmth, and no redness over joints.  Pulses: 2+ DP/PT pulses  bilaterally Extremities: No cyanosis, clubbing, edema Neurologic: alert & oriented X3, cranial nerves II-XII intact, strength normal in all extremities, sensation intact to light touch Skin: healed 1x1.5cm ulcer on right anterior shin of Lower extremity Psych: Oriented X3, memory intact for recent and remote, normally interactive, good eye contact, + anxious appearing, and + depressed appearing. GU: erythematous vaginal canal, copious thick white discharge, cervix os: erythema, no lesions/ulcers noted, no cervical motion tenderness, no adnexal tenderness.  Lab results: Basic Metabolic Panel:  [facility] 03/03/11 2052  NA 134*  K 3.7  CL 96  CO2 21  GLUCOSE 299*  BUN 11  CREATININE 0.60  CALCIUM 10.2  MG --  PHOS --   Liver Function Tests:  [facility] 03/03/11 2052  AST 15  ALT 6  ALKPHOS 87  BILITOT 0.4  PROT 9.2*  ALBUMIN 3.9    Basename 03/03/11 2052  LIPASE 22  AMYLASE --   CBC:  Basename 03/03/11 2052  WBC 18.7*  NEUTROABS 16.8*  HGB 11.0*  HCT 35.1*  MCV 59.3*  PLT 625*    Urine Drug Screen: Pending  Urinalysis:  Color, Urine                             YELLOW            YELLOW  Appearance                               TURBID     a      CLEAR  Specific Gravity                         >1.046     h      1.005-1.030  pH                                       5.0  5.0-8.0  Urine Glucose                            >1000      a      NEG              mg/dL  Bilirubin                                NEGATIVE          NEG  Ketones                                  >80        a      NEG              mg/dL  Blood                                    NEGATIVE          NEG  Protein                                  30         a      NEG              mg/dL  Urobilinogen                             0.2               0.0-1.0          mg/dL  Nitrite                                  NEGATIVE          NEG  Leukocytes                               NEGATIVE           NEG  Squamous Epithelial / LPF                MANY       a      RARE  WBC / HPF                                3-6               <3               WBC/hpf  RBC / HPF                                0-2               <3               RBC/hpf  Bacteria / HPF  MANY       a      RARE   Imaging results:  Dg Abd Acute W/chest  03/03/2011  *RADIOLOGY REPORT*  Clinical Data: Cocaine withdrawal; diffuse chest and abdominal pain.  ACUTE ABDOMEN SERIES (ABDOMEN 2 VIEW & CHEST 1 VIEW)  Comparison: Chest and abdominal radiographs performed 01/26/2011  Findings: The lungs are well-aerated and clear.  There is no evidence of focal opacification, pleural effusion or pneumothorax. The cardiomediastinal silhouette is within normal limits.  The visualized bowel gas pattern is unremarkable.  Scattered stool and air are noted within the colon; there is no evidence of small bowel dilatation to suggest obstruction.  No free intra-abdominal air is identified on the provided upright view.  No acute osseous abnormalities are seen; the sacroiliac joints are unremarkable in appearance.  IMPRESSION:  1.  Unremarkable bowel gas pattern; no free intra-abdominal air seen. 2.  No acute cardiopulmonary process identified.  Original Report Authenticated By: Tonia Ghent, M.D.    Assessment & Plan by Problem: 1. Abdominal pain- unclear etiology.  Likely 2/2 to DKA.  Patient had multiple GI work-up in the past because of her numerous abdominal pain admissions including normal gastric emptying, multiple abdominal Xrays and CTs, vaginal ultrasound and EGD.  GI evaluated patient in the past and they thought that her abd pain was likely 2/2 to gastroparesis even though her gastric emptying study was normal (of note, patient was taking Reglan near time of study) and that GI had no other inputs.  Patient was supposed to follow up with Dr. Russella Dar as outpatient.  Unlikely pancreatitis as her lipase is normal.  Pyelonephritis  is also not likely as her Urinalysis does not show any nitrites or leukocytes.  Pregnancy is ruled out with a negative urine pregnancy test. Pelvic inflammatory disease is another possibility given pt multiple sexual partners which put her at increased risk. Diverticulitis is also possible given her elevated white count and the location of her abdominal pain.  -Will admit to SDU/Telemetry  -Pain control with Percocet, may need to escalate to IV dilaudid if pain not controlled with oral meds -Will repeat CT of abd/pelvis because her last one was in 08/2010 (normal) -Will get UDS -Will obtain medical records from Dr. Sharmon Leyden -Will perform pelvic exam and send for GC chlamydia and wet prep.  -May need transvaginal ultrasound in AM -will get serum hcg    2. DM: probably mild DKA, anion gap of 17 with + urine ketones. Her abd pain might be secondary to DKA.  -Insulin- resistant scale and repeat BMP, no indication for glucomander since her CBG is 299 -Check CBGs q2hr and BMP q4hrs -Replete K as necessary  -IVF hydration at 200 cc/hr,   -Zofran IV for N/V -Will check Hb A1c since her last HbA1c was in 09/2010: 8.7  3. Hx CAD: stable, Will continue Plavix and ASA. Will recheck lipid panel and consider starting statin since her last LDL was 96 in 01/2010 which is above target given her risk factors: HTN, DM, CAD.   4. Depression/Anxiety: stable, continue Klonopin home dose  5. Anemia: Baseline Hb 9-11.  Hb today is 11.  Stable- likely from iron deficiency from heavy menstrual bleeding. Patient was evaluated by hematology in 04/2010.  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 Darrold Span did not think it would cause anemia unless she is homozygous.  He recommended ferrous gluconate/fumarate. Last anemia panel in 04/2009: Iron 114, TIBC 368, %sat 31, UIBC 254,  Ferritin 12, Vit B 420. Will get repeat anemia panel.   6. Hx of leukocytosis- WBC 18.7 (baseline WBC 14-21).  Likely 2/2 to chronic  inflammation from chronic hep B.  Again, pt was evaluated by hematology in 04/2010. Dr. Darrold Span noted in his consult that " It is difficult for me to sort out all the past CBCs  with her other problems, but this could be related to chronic  hepatitis, agree with the LAP score as ordered.  Smear is not suggestive of CML to me now." Will continue to monitor.  7. Hx of thrombocytosis- likely 2/2 chronic inflammation and iron deficiency anemia.   8. Fibromyalgia- stable, will continue home meds  9. HTN: stable, continue home meds  10. Hx of Hep B:stable  11. Polysubstance abuse: will get UDS, polysubstance abuse counseling  Signed: Mithcell Schumpert 03/04/2011, 1:03 AM   Lorretta Harp, PGY-1

## 2011-03-05 LAB — GLUCOSE, CAPILLARY: Glucose-Capillary: 203 mg/dL — ABNORMAL HIGH (ref 70–99)

## 2011-03-05 LAB — BASIC METABOLIC PANEL
BUN: 7 mg/dL (ref 6–23)
BUN: 5 mg/dL — ABNORMAL LOW (ref 6–23)
CO2: 24 mEq/L (ref 19–32)
CO2: 17 mEq/L — ABNORMAL LOW (ref 19–32)
Calcium: 9.1 mg/dL (ref 8.4–10.5)
Calcium: 8.8 mg/dL (ref 8.4–10.5)
Chloride: 100 mEq/L (ref 96–112)
Chloride: 105 mEq/L (ref 96–112)
Creatinine, Ser: 0.6 mg/dL (ref 0.50–1.10)
Creatinine, Ser: 0.57 mg/dL (ref 0.50–1.10)
GFR calc Af Amer: >90 mL/min (ref >90)
Glucose, Bld: 206 mg/dL — ABNORMAL HIGH (ref 70–99)
Glucose, Bld: 114 mg/dL — ABNORMAL HIGH (ref 70–99)
Sodium: 131 mEq/L — ABNORMAL LOW (ref 135–145)

## 2011-03-05 LAB — URINALYSIS, ROUTINE W REFLEX MICROSCOPIC
Bilirubin Urine: NEGATIVE
Ketones, ur: 15 mg/dL — AB
Nitrite: NEGATIVE
Specific Gravity, Urine: 1.016 (ref 1.005–1.030)
Urobilinogen, UA: 1 mg/dL (ref 0.0–1.0)

## 2011-03-05 LAB — URINE MICROSCOPIC-ADD ON

## 2011-03-05 LAB — HEPATITIS B SURFACE ANTIGEN: Hepatitis B Surface Ag: POSITIVE — AB

## 2011-03-05 MED ORDER — INSULIN ASPART 100 UNIT/ML ~~LOC~~ SOLN
0.0000 [IU] | Freq: Three times a day (TID) | SUBCUTANEOUS | Status: DC
  Administered 2011-03-06 (×3): 2 [IU] via SUBCUTANEOUS
  Administered 2011-03-07: 3 [IU] via SUBCUTANEOUS
  Administered 2011-03-07: 7 [IU] via SUBCUTANEOUS
  Administered 2011-03-07: 9 [IU] via SUBCUTANEOUS

## 2011-03-05 MED ORDER — INSULIN ASPART 100 UNIT/ML ~~LOC~~ SOLN
3.0000 [IU] | Freq: Three times a day (TID) | SUBCUTANEOUS | Status: DC
  Administered 2011-03-07 (×3): 3 [IU] via SUBCUTANEOUS

## 2011-03-05 MED ORDER — INSULIN ASPART 100 UNIT/ML ~~LOC~~ SOLN: 0.0000 [IU] | Freq: Every day | SUBCUTANEOUS | Status: DC

## 2011-03-05 MED ORDER — ACETAMINOPHEN 325 MG PO TABS: 325.0000 mg | ORAL_TABLET | Freq: Four times a day (QID) | ORAL | Status: DC | PRN

## 2011-03-05 MED ORDER — POTASSIUM CHLORIDE 10 MEQ/100ML IV SOLN
10.0000 meq | INTRAVENOUS | Status: AC
  Administered 2011-03-05 (×4): 10 meq via INTRAVENOUS
  Filled 2011-03-05 (×4): qty 100

## 2011-03-05 NOTE — Progress Notes (Signed)
BP=164/125.  MD notified.  Percocet 1 tab po given for abdominal pain 9/10 per PRN order.  No new orders received.  Will continue to monitor. Alonza Bogus 03/05/11 0630

## 2011-03-05 NOTE — Progress Notes (Signed)
Subjective:  Pt reports continued nausea and vomiting during the night. Abdominal pain is located in LLQ is a sharp, intermittent, 8/10 with no significant improvement since yesterday. Denies CP/SOB/HA/dizziness. Minimal appetite. Did not eat her breakfast and lunch. Pt cannot identify precipitating or related factors, such as association with food.   Objective: Vital signs in last 24 hours: Filed Vitals:   03/04/11 0325 03/04/11 1300 03/04/11 2200 03/05/11 0628  BP: 136/94 134/84 163/107 164/125  Pulse: 123 118 116 104  Temp: 97.6 F (36.4 C) 98.4 F (36.9 C) 98.5 F (36.9 C) 98.6 F (37 C)  TempSrc: Oral Oral Oral Oral  Resp: 20 20 18 20   Height: 5\' 7"  (1.702 m)     Weight: 218 lb 11.1 oz (99.2 kg)     SpO2: 96% 99% 99% 100%   Weight change:   Intake/Output Summary (Last 24 hours) at 03/05/11 0721 Last data filed at 03/05/11 0630  Gross per 24 hour  Intake 2996.67 ml  Output   4700 ml  Net -1703.33 ml   Physical Exam: Constitutional: Vital signs reviewed.  Patient is a well-developed and well-nourished  in no acute distress and cooperative with exam. Alert and oriented x3.  Cardiovascular: RRR, S1 normal, S2 normal, no MRG, pulses symmetric and intact bilaterally Pulmonary/Chest: CTAB, no wheezes, rales, or rhonchi Abdominal: Soft. Abd: soft, nontender, nondistended, obese, hyperactive BS, no pain with deep auscultation, but reports tenderness to deep palpation of LLQ  GU: no CVA tenderness Neurological: A&O x3,   no focal motor deficit, sensory intact to light touch bilaterally.    Lab Results: Basic Metabolic Panel:  Lab 03/05/11 1610 03/04/11 1950  NA 133* 135  K 3.7 3.6  CL 98 104  CO2 24 23  GLUCOSE 206* 253*  BUN 7 11  CREATININE 0.60 0.76  CALCIUM 9.1 8.9  MG -- --  PHOS -- --   Liver Function Tests:  Lab 03/03/11 2052  AST 15  ALT 6  ALKPHOS 87  BILITOT 0.4  PROT 9.2*  ALBUMIN 3.9    Lab 03/03/11 2052  LIPASE 22  AMYLASE --   CBC:  Lab  03/04/11 0600 03/03/11 2052  WBC 19.4* 18.7*  NEUTROABS -- 16.8*  HGB 9.9* 11.0*  HCT 31.8* 35.1*  MCV 59.7* 59.3*  PLT 545* 625*  CBG:  Lab 03/04/11 2115 03/04/11 1652 03/04/11 1215 03/04/11 0739 03/04/11 0612 03/04/11 0325  GLUCAP 203* 204* 213* 259* 294* 334*   Hemoglobin A1C:  Lab 03/04/11 0907  HGBA1C 11.0*   Fasting Lipid Panel:  Lab 03/04/11 0857  CHOL 204*  HDL 50  LDLCALC 126*  TRIG 140  CHOLHDL 4.1  LDLDIRECT --   Anemia Panel:  Lab 03/04/11 0907  VITAMINB12 448  FOLATE 12.9  FERRITIN 11  TIBC 428  IRON 23*  RETICCTPCT 1.5    Micro Results: Recent Results (from the past 240 hour(s))  WET PREP, GENITAL     Status: Abnormal   Collection Time   03/04/11  6:42 AM      Component Value Range Status Comment   Yeast, Wet Prep NONE SEEN  NONE SEEN  Final    Trich, Wet Prep NONE SEEN  NONE SEEN  Final    Clue Cells, Wet Prep MANY (*) NONE SEEN  Final    WBC, Wet Prep HPF POC FEW (*) NONE SEEN  Final    Studies/Results: US Transvaginal Non-ob  03/04/2011  *RADIOLOGY REPORT*  Clinical Data:  Left pelvic pain, evaluate for  ovarian cyst / torsion  TRANSABDOMINAL AND TRANSVAGINAL ULTRASOUND OF PELVIS DOPPLER ULTRASOUND OF OVARIES  Technique:  Both transabdominal and transvaginal ultrasound examinations of the pelvis were performed. Transabdominal technique was performed for global imaging of the pelvis including uterus, ovaries, adnexal regions, and pelvic cul-de-sac.  It was necessary to proceed with endovaginal exam following the transabdominal exam to visualize the endometrium.  Color and duplex Doppler ultrasound was utilized to evaluate blood flow to the ovaries.  Comparison:  CT abdomen/pelvis dated 08/28/2010.  Findings:  Uterus:  Retroverted and mildly heterogeneous.  Measures 8.2 x 4.3 x 5.0 cm.  Endometrium:  Normal in thickness and appearance, measuring 10 mm.  Right ovary: Poorly visualized.  Measures approximately 3.6 x 2.8 x 2.3 cm.  Left ovary:   Normal  appearance/no adnexal mass, measuring 3.6 x 2.6 x 2.5 cm, notable for a corpus luteal cyst.  Additional comments:  Trace pelvic ascites.  Pulsed Doppler evaluation demonstrates normal low-resistance arterial and venous waveforms in both ovaries.  IMPRESSION: Normal exam.  No evidence of pelvic mass or other significant abnormality.  Left corpus luteal cyst.  No sonographic evidence for ovarian torsion.  Original Report Authenticated By: Charline Bills, M.D.   US Pelvis Complete  03/04/2011  *RADIOLOGY REPORT*  Clinical Data:  Left pelvic pain, evaluate for ovarian cyst / torsion  TRANSABDOMINAL AND TRANSVAGINAL ULTRASOUND OF PELVIS DOPPLER ULTRASOUND OF OVARIES  Technique:  Both transabdominal and transvaginal ultrasound examinations of the pelvis were performed. Transabdominal technique was performed for global imaging of the pelvis including uterus, ovaries, adnexal regions, and pelvic cul-de-sac.  It was necessary to proceed with endovaginal exam following the transabdominal exam to visualize the endometrium.  Color and duplex Doppler ultrasound was utilized to evaluate blood flow to the ovaries.  Comparison:  CT abdomen/pelvis dated 08/28/2010.  Findings:  Uterus:  Retroverted and mildly heterogeneous.  Measures 8.2 x 4.3 x 5.0 cm.  Endometrium:  Normal in thickness and appearance, measuring 10 mm.  Right ovary: Poorly visualized.  Measures approximately 3.6 x 2.8 x 2.3 cm.  Left ovary:   Normal appearance/no adnexal mass, measuring 3.6 x 2.6 x 2.5 cm, notable for a corpus luteal cyst.  Additional comments:  Trace pelvic ascites.  Pulsed Doppler evaluation demonstrates normal low-resistance arterial and venous waveforms in both ovaries.  IMPRESSION: Normal exam.  No evidence of pelvic mass or other significant abnormality.  Left corpus luteal cyst.  No sonographic evidence for ovarian torsion.  Original Report Authenticated By: Charline Bills, M.D.   Ct Abdomen Pelvis W Contrast  03/04/2011   *RADIOLOGY REPORT*  Clinical Data: Left lower abdominal pain, hepatitis B, cocaine withdrawal  CT ABDOMEN AND PELVIS WITH CONTRAST  Technique:  Multidetector CT imaging of the abdomen and pelvis was performed following the standard protocol during bolus administration of intravenous contrast.  Contrast: OMNIPAQUE IOHEXOL 300 MG/ML IV SOLN  Comparison: 08/28/2010  Findings: Lung bases are clear.  Liver, spleen, pancreas, and adrenal glands within normal limits.  Gallbladder is unremarkable.  No intrahepatic or extrahepatic ductal dilatation.  Kidneys within normal limits.  No hydronephrosis.  No evidence of bowel obstruction.  Normal appendix.  No colonic wall thickening or inflammatory changes.  No evidence of abdominal aortic aneurysm.  No abdominopelvic ascites.  No suspicious abdominopelvic lymphadenopathy.  Uterus and bilateral ovaries are unremarkable, noting a left corpus luteal cyst.  Bladder is within normal limits.  Mild degenerative changes of the visualized thoracolumbar spine.  IMPRESSION: No evidence of bowel obstruction.  Normal appendix.  Left corpus luteal cyst.  Otherwise, no CT findings to account for the patient's abdominal pain.  Original Report Authenticated By: Charline Bills, M.D.   [place] Art/ven Flow Abd Pelv Doppler  03/04/2011  *RADIOLOGY REPORT*  Clinical Data:  Left pelvic pain, evaluate for ovarian cyst / torsion  TRANSABDOMINAL AND TRANSVAGINAL ULTRASOUND OF PELVIS DOPPLER ULTRASOUND OF OVARIES  Technique:  Both transabdominal and transvaginal ultrasound examinations of the pelvis were performed. Transabdominal technique was performed for global imaging of the pelvis including uterus, ovaries, adnexal regions, and pelvic cul-de-sac.  It was necessary to proceed with endovaginal exam following the transabdominal exam to visualize the endometrium.  Color and duplex Doppler ultrasound was utilized to evaluate blood flow to the ovaries.  Comparison:  CT abdomen/pelvis dated  08/28/2010.  Findings:  Uterus:  Retroverted and mildly heterogeneous.  Measures 8.2 x 4.3 x 5.0 cm.  Endometrium:  Normal in thickness and appearance, measuring 10 mm.  Right ovary: Poorly visualized.  Measures approximately 3.6 x 2.8 x 2.3 cm.  Left ovary:   Normal appearance/no adnexal mass, measuring 3.6 x 2.6 x 2.5 cm, notable for a corpus luteal cyst.  Additional comments:  Trace pelvic ascites.  Pulsed Doppler evaluation demonstrates normal low-resistance arterial and venous waveforms in both ovaries.  IMPRESSION: Normal exam.  No evidence of pelvic mass or other significant abnormality.  Left corpus luteal cyst.  No sonographic evidence for ovarian torsion.  Original Report Authenticated By: Charline Bills, M.D.   Dg Abd Acute W/chest  03/03/2011  *RADIOLOGY REPORT*  Clinical Data: Cocaine withdrawal; diffuse chest and abdominal pain.  ACUTE ABDOMEN SERIES (ABDOMEN 2 VIEW & CHEST 1 VIEW)  Comparison: Chest and abdominal radiographs performed 01/26/2011  Findings: The lungs are well-aerated and clear.  There is no evidence of focal opacification, pleural effusion or pneumothorax. The cardiomediastinal silhouette is within normal limits.  The visualized bowel gas pattern is unremarkable.  Scattered stool and air are noted within the colon; there is no evidence of small bowel dilatation to suggest obstruction.  No free intra-abdominal air is identified on the provided upright view.  No acute osseous abnormalities are seen; the sacroiliac joints are unremarkable in appearance.  IMPRESSION:  1.  Unremarkable bowel gas pattern; no free intra-abdominal air seen. 2.  No acute cardiopulmonary process identified.  Original Report Authenticated By: Tonia Ghent, M.D.   Medications: I have reviewed the patient's current medications. Scheduled Meds:   . aspirin  325 mg Oral Daily  . clopidogrel  75 mg Oral Q breakfast  . enoxaparin  40 mg Subcutaneous Daily  . ferrous gluconate  325 mg Oral BID WC  .  Fluticasone-Salmeterol  1 puff Inhalation BID  . insulin aspart  0-20 Units Subcutaneous TID WC  . insulin aspart  0-5 Units Subcutaneous QHS  . insulin glargine  30 Units Subcutaneous QHS  . metoprolol tartrate  25 mg Oral BID  . metroNIDAZOLE  500 mg Oral Q12H  .  morphine injection  1 mg Intravenous Once  . ondansetron (ZOFRAN) IV  4 mg Intravenous Once  . pantoprazole  40 mg Oral Q1200  . DISCONTD: insulin aspart  0-20 Units Subcutaneous Q4H   Continuous Infusions:   . sodium chloride 200 mL/hr at 03/05/11 0247   PRN Meds:.clonazePAM, iohexol, oxyCODONE-acetaminophen, promethazine, DISCONTD: ondansetron (ZOFRAN) IV Assessment/Plan:  Pt is a 35yo woman with multiple comorbidities including fibromyalgia, depression, and treatment with H. pylori, presents with LLQ abdominal pain, nausea, and vomiting. Pt also reports  vaginal itching and white discharge.   # Abdominal pain- Etiology is unclear at this time. She has had multiple GI workups in the past for similar symptoms with no underlying cause discerned.Urine pregnancy test (quantitative & qualitative) were negative, thus ectopic pregnancy is not in the differential. Pelvic US as well as the CT abdomen and pelvis  showed only a left luteal cyst but otherwise no acute findings.  PID is a possibility and will be evaluated with GC/Chlamydia test, though patient did not experience cervical motion tenderness on physical exam.  Finally, patient's UDS was positive for cocaine, and thus abdominal pain may be 2/2 arterial vasospasms. Furthermore consider IBS especially in the setting of chronic elevated ESR , CRP and WBC.  -Pain control with  prn Percocet  -N/V control with prn phenergan  - Consider GI consult for further evaluation and management.   #Vaginal Discharge: micro revealed clue cells, therefore will treat for bacterial vaginosis  Plan: Metronidazole 500mg  PO BID   #Questionable AG metabolic acidosis resolved.   #Hypokalemia: Will  replete .   #. DM- Possible DKA. HbA1c is 11.  Patient notes compliance with home dose insulin. She reports she is not taking metformin 2/2 nausea side effects. Will continue to assess blood glucose levels and control with SSI.  -SSI and 30 units lantus qHS  -IVF at 200 cc/hr   #Polysubstance abuse: UDS positve for cocaine.  Plan: social work consult   #. History of CAD- Stable on admission. No CP  -Continue home ASA and Plavix  -Lipid panel suggests HLD, will start statin when pain symptoms resolve with goal LDL <100   # Depression/Anxiety- Stable on admission.  -Continue home Klonopin   # Anemia- Baseline Hgb 9-11,Stable at this point.  Chronic anemia likely due to iron deficiency complicated by heavy menstrual bleeding and noncompliance with home iron supplements. Chart review suggests Hemoglobin C, and electrophoresis in 2009, but no documentation of electrophoresis itself.  Per Hematology notes from 02/2010 unlikely sine patient have to be homozygous -Restart iron supplements once N/V resolves  -Daily CBC   # Leukocytosis- Present at baseline. WBC 18.7 on admission, 19.4 this morning. Will continue to monitor. May be related to chronic hepatitis. In Jan 2012, Dr. Darrold Span was consulted and was not convinced of CML at that time. ESR elevated with 36 but it has been ranging between 45-48 as well  CRP elevated with 0.63 again has been elevated between 0.4-2.7 which most consistent with an inflammatory process.  -Daily CBC  - Will follow up on LAP  # History of thrombocytosis- Likely secondary to chronic inflammation and iron deficiency anemia. Will continue to monitor.  -Daily CBC   # HTN- Elevated . Will continue home meds.   #. History of Hep B- Stable. Will follow up Hep surface & e antigens     LOS: 2 days   Vang Kraeger 03/05/2011, 7:21 AM

## 2011-03-06 DIAGNOSIS — R109 Unspecified abdominal pain: Secondary | ICD-10-CM

## 2011-03-06 DIAGNOSIS — R1032 Left lower quadrant pain: Secondary | ICD-10-CM

## 2011-03-06 DIAGNOSIS — R112 Nausea with vomiting, unspecified: Secondary | ICD-10-CM

## 2011-03-06 DIAGNOSIS — D509 Iron deficiency anemia, unspecified: Secondary | ICD-10-CM

## 2011-03-06 DIAGNOSIS — R7309 Other abnormal glucose: Secondary | ICD-10-CM

## 2011-03-06 LAB — MAGNESIUM: Magnesium: 1.7 mg/dL (ref 1.5–2.5)

## 2011-03-06 LAB — BASIC METABOLIC PANEL
BUN: 6 mg/dL (ref 6–23)
CO2: 24 mEq/L (ref 19–32)
Chloride: 102 mEq/L (ref 96–112)
Creatinine, Ser: 0.6 mg/dL (ref 0.50–1.10)
Glucose, Bld: 207 mg/dL — ABNORMAL HIGH (ref 70–99)

## 2011-03-06 LAB — CBC
HCT: 32.3 % — ABNORMAL LOW (ref 36.0–46.0)
MCV: 59.2 fL — ABNORMAL LOW (ref 78.0–100.0)
Platelets: 380 10*3/uL (ref 150–400)
RBC: 5.46 MIL/uL — ABNORMAL HIGH (ref 3.87–5.11)
WBC: 13.5 10*3/uL — ABNORMAL HIGH (ref 4.0–10.5)

## 2011-03-06 LAB — GLUCOSE, CAPILLARY
Glucose-Capillary: 177 mg/dL — ABNORMAL HIGH (ref 70–99)
Glucose-Capillary: 198 mg/dL — ABNORMAL HIGH (ref 70–99)
Glucose-Capillary: 180 mg/dL — ABNORMAL HIGH (ref 70–99)
Glucose-Capillary: 145 mg/dL — ABNORMAL HIGH (ref 70–99)

## 2011-03-06 MED ORDER — ONDANSETRON HCL 4 MG/2ML IJ SOLN
4.0000 mg | Freq: Four times a day (QID) | INTRAMUSCULAR | Status: DC | PRN
  Administered 2011-03-07: 4 mg via INTRAVENOUS
  Filled 2011-03-06 (×2): qty 2

## 2011-03-06 MED ORDER — POTASSIUM CHLORIDE 10 MEQ/100ML IV SOLN
10.0000 meq | INTRAVENOUS | Status: AC
  Administered 2011-03-06 (×4): 10 meq via INTRAVENOUS
  Filled 2011-03-06 (×4): qty 100

## 2011-03-06 MED ORDER — SENNA 8.6 MG PO TABS
1.0000 | ORAL_TABLET | Freq: Every day | ORAL | Status: DC
  Administered 2011-03-07 – 2011-03-10 (×4): 8.6 mg via ORAL
  Filled 2011-03-06 (×5): qty 1

## 2011-03-06 MED ORDER — DOCUSATE SODIUM 100 MG PO CAPS
100.0000 mg | ORAL_CAPSULE | Freq: Two times a day (BID) | ORAL | Status: DC
  Administered 2011-03-06 – 2011-03-10 (×7): 100 mg via ORAL
  Filled 2011-03-06 (×10): qty 1

## 2011-03-06 MED ORDER — INSULIN GLARGINE 100 UNIT/ML ~~LOC~~ SOLN
35.0000 [IU] | Freq: Every day | SUBCUTANEOUS | Status: DC
  Administered 2011-03-06: 35 [IU] via SUBCUTANEOUS

## 2011-03-06 MED ORDER — MORPHINE SULFATE 2 MG/ML IJ SOLN
2.0000 mg | Freq: Once | INTRAMUSCULAR | Status: AC
  Administered 2011-03-06: 2 mg via INTRAVENOUS
  Filled 2011-03-06: qty 1

## 2011-03-06 MED ORDER — KETOROLAC TROMETHAMINE 30 MG/ML IJ SOLN
30.0000 mg | Freq: Once | INTRAMUSCULAR | Status: AC
  Administered 2011-03-06: 30 mg via INTRAVENOUS
  Filled 2011-03-06: qty 1

## 2011-03-06 MED ORDER — LABETALOL HCL 5 MG/ML IV SOLN
20.0000 mg | INTRAVENOUS | Status: DC | PRN
  Administered 2011-03-06: 20 mg via INTRAVENOUS
  Filled 2011-03-06: qty 4

## 2011-03-06 MED ORDER — HYDROCHLOROTHIAZIDE 25 MG PO TABS
25.0000 mg | ORAL_TABLET | Freq: Every day | ORAL | Status: DC
  Administered 2011-03-06 – 2011-03-08 (×3): 25 mg via ORAL
  Filled 2011-03-06 (×3): qty 1

## 2011-03-06 MED ORDER — LABETALOL HCL 100 MG PO TABS
50.0000 mg | ORAL_TABLET | Freq: Two times a day (BID) | ORAL | Status: DC
  Administered 2011-03-06 – 2011-03-08 (×6): 50 mg via ORAL
  Filled 2011-03-06 (×12): qty 0.5

## 2011-03-06 NOTE — Progress Notes (Addendum)
Subjective: Denies constipation/diarrhea, but later reports no BM since last Tuesday.  Persistent nausea.  Reports nonbloody, green colored vomitus this morning.  Nurse confirms one episode yesterday, and possibly one episode overnight.  Persistent LLQ pain.    Objective: Vital signs in last 24 hours: Filed Vitals:   03/05/11 2210 03/06/11 0015 03/06/11 0100 03/06/11 0645  BP: 176/107 170/127 161/120 123/79  Pulse: 111  106 100  Temp:      TempSrc:      Resp:    14  Height:      Weight:      SpO2:    99%   Weight change:   Intake/Output Summary (Last 24 hours) at 03/06/11 1004 Last data filed at 03/06/11 0659  Gross per 24 hour  Intake 4127.92 ml  Output   1900 ml  Net 2227.92 ml   Physical Exam: General: resting in bed, awake, NAD  HEENT: PERRL, EOMI, no scleral icterus  Cardiac: RRR, no rubs, murmurs or gallops  Pulm: clear to auscultation bilaterally, moving normal volumes of air  Abd: soft, nontender, nondistended, obese, hyperactive BS, no pain with deep auscultation, but reports tenderness to deep palpation of LLQ  Ext: warm and well perfused, no pedal edema  Neuro: alert and oriented X3, cranial nerves II-XII grossly intact, strength and sensation to light touch equal in bilateral upper and lower extremities  Lab Results: Basic Metabolic Panel:  Lab 03/05/11 9147 03/05/11 0656  NA 140 131*  K 3.2* 4.5  CL 105 100  CO2 26 17*  GLUCOSE 114* 184*  BUN 5* 5*  CREATININE 0.57 0.52  CALCIUM 8.8 8.8  MG -- --  PHOS -- --   Liver Function Tests:  Lab 03/03/11 2052  AST 15  ALT 6  ALKPHOS 87  BILITOT 0.4  PROT 9.2*  ALBUMIN 3.9    Lab 03/03/11 2052  LIPASE 22  AMYLASE --   CBC:  Lab 03/05/11 0656 03/04/11 0600 03/03/11 2052  WBC 13.5* 19.4* --  NEUTROABS -- -- 16.8*  HGB 10.2* 9.9* --  HCT 32.3* 31.8* --  MCV 59.2* 59.7* --  PLT 380 545* --   CBG:  Lab 03/06/11 0730 03/05/11 2044 03/05/11 1710 03/05/11 1151 03/05/11 0756 03/04/11 2115  GLUCAP  177* 142* 118* 145* 185* 203*   Hemoglobin A1C:  Lab 03/04/11 0907  HGBA1C 11.0*   Fasting Lipid Panel:  Lab 03/04/11 0857  CHOL 204*  HDL 50  LDLCALC 126*  TRIG 140  CHOLHDL 4.1  LDLDIRECT --   Anemia Panel:  Lab 03/04/11 0907  VITAMINB12 448  FOLATE 12.9  FERRITIN 11  TIBC 428  IRON 23*  RETICCTPCT 1.5    Micro Results: Recent Results (from the past 240 hour(s))  WET PREP, GENITAL     Status: Abnormal   Collection Time   03/04/11  6:42 AM      Component Value Range Status Comment   Yeast, Wet Prep NONE SEEN  NONE SEEN  Final    Trich, Wet Prep NONE SEEN  NONE SEEN  Final    Clue Cells, Wet Prep MANY (*) NONE SEEN  Final    WBC, Wet Prep HPF POC FEW (*) NONE SEEN  Final   CULTURE, BLOOD (ROUTINE X 2)     Status: Normal (Preliminary result)   Collection Time   03/04/11  9:43 AM      Component Value Range Status Comment   Specimen Description BLOOD LEFT ARM   Final  Special Requests     Final    Value: BOTTLES DRAWN AEROBIC AND ANAEROBIC 10CC BLUE,5CC RED   Setup Time [id number]   Final    Culture     Final    Value:        BLOOD CULTURE RECEIVED NO GROWTH TO DATE CULTURE WILL BE HELD FOR 5 DAYS BEFORE ISSUING A FINAL NEGATIVE REPORT   Report Status PENDING   Incomplete   CULTURE, BLOOD (ROUTINE X 2)     Status: Normal (Preliminary result)   Collection Time   03/04/11  9:50 AM      Component Value Range Status Comment   Specimen Description BLOOD LEFT HAND   Final    Special Requests     Final    Value: BOTTLES DRAWN AEROBIC AND ANAEROBIC 10CC BLUE,5CC RED   Setup Time [id number]   Final    Culture     Final    Value:        BLOOD CULTURE RECEIVED NO GROWTH TO DATE CULTURE WILL BE HELD FOR 5 DAYS BEFORE ISSUING A FINAL NEGATIVE REPORT   Report Status PENDING   Incomplete    Studies/Results: US Transvaginal Non-ob  03/04/2011  *RADIOLOGY REPORT*  Clinical Data:  Left pelvic pain, evaluate for ovarian cyst / torsion  TRANSABDOMINAL AND TRANSVAGINAL  ULTRASOUND OF PELVIS DOPPLER ULTRASOUND OF OVARIES  Technique:  Both transabdominal and transvaginal ultrasound examinations of the pelvis were performed. Transabdominal technique was performed for global imaging of the pelvis including uterus, ovaries, adnexal regions, and pelvic cul-de-sac.  It was necessary to proceed with endovaginal exam following the transabdominal exam to visualize the endometrium.  Color and duplex Doppler ultrasound was utilized to evaluate blood flow to the ovaries.  Comparison:  CT abdomen/pelvis dated 08/28/2010.  Findings:  Uterus:  Retroverted and mildly heterogeneous.  Measures 8.2 x 4.3 x 5.0 cm.  Endometrium:  Normal in thickness and appearance, measuring 10 mm.  Right ovary: Poorly visualized.  Measures approximately 3.6 x 2.8 x 2.3 cm.  Left ovary:   Normal appearance/no adnexal mass, measuring 3.6 x 2.6 x 2.5 cm, notable for a corpus luteal cyst.  Additional comments:  Trace pelvic ascites.  Pulsed Doppler evaluation demonstrates normal low-resistance arterial and venous waveforms in both ovaries.  IMPRESSION: Normal exam.  No evidence of pelvic mass or other significant abnormality.  Left corpus luteal cyst.  No sonographic evidence for ovarian torsion.  Original Report Authenticated By: Charline Bills, M.D.   US Pelvis Complete  03/04/2011  *RADIOLOGY REPORT*  Clinical Data:  Left pelvic pain, evaluate for ovarian cyst / torsion  TRANSABDOMINAL AND TRANSVAGINAL ULTRASOUND OF PELVIS DOPPLER ULTRASOUND OF OVARIES  Technique:  Both transabdominal and transvaginal ultrasound examinations of the pelvis were performed. Transabdominal technique was performed for global imaging of the pelvis including uterus, ovaries, adnexal regions, and pelvic cul-de-sac.  It was necessary to proceed with endovaginal exam following the transabdominal exam to visualize the endometrium.  Color and duplex Doppler ultrasound was utilized to evaluate blood flow to the ovaries.  Comparison:  CT  abdomen/pelvis dated 08/28/2010.  Findings:  Uterus:  Retroverted and mildly heterogeneous.  Measures 8.2 x 4.3 x 5.0 cm.  Endometrium:  Normal in thickness and appearance, measuring 10 mm.  Right ovary: Poorly visualized.  Measures approximately 3.6 x 2.8 x 2.3 cm.  Left ovary:   Normal appearance/no adnexal mass, measuring 3.6 x 2.6 x 2.5 cm, notable for a corpus luteal cyst.  Additional comments:  Trace  pelvic ascites.  Pulsed Doppler evaluation demonstrates normal low-resistance arterial and venous waveforms in both ovaries.  IMPRESSION: Normal exam.  No evidence of pelvic mass or other significant abnormality.  Left corpus luteal cyst.  No sonographic evidence for ovarian torsion.  Original Report Authenticated By: Charline Bills, M.D.   Ct Abdomen Pelvis W Contrast  03/04/2011  *RADIOLOGY REPORT*  Clinical Data: Left lower abdominal pain, hepatitis B, cocaine withdrawal  CT ABDOMEN AND PELVIS WITH CONTRAST  Technique:  Multidetector CT imaging of the abdomen and pelvis was performed following the standard protocol during bolus administration of intravenous contrast.  Contrast: OMNIPAQUE IOHEXOL 300 MG/ML IV SOLN  Comparison: 08/28/2010  Findings: Lung bases are clear.  Liver, spleen, pancreas, and adrenal glands within normal limits.  Gallbladder is unremarkable.  No intrahepatic or extrahepatic ductal dilatation.  Kidneys within normal limits.  No hydronephrosis.  No evidence of bowel obstruction.  Normal appendix.  No colonic wall thickening or inflammatory changes.  No evidence of abdominal aortic aneurysm.  No abdominopelvic ascites.  No suspicious abdominopelvic lymphadenopathy.  Uterus and bilateral ovaries are unremarkable, noting a left corpus luteal cyst.  Bladder is within normal limits.  Mild degenerative changes of the visualized thoracolumbar spine.  IMPRESSION: No evidence of bowel obstruction.  Normal appendix.  Left corpus luteal cyst.  Otherwise, no CT findings to account for the  patient's abdominal pain.  Original Report Authenticated By: Charline Bills, M.D.   [place] Art/ven Flow Abd Pelv Doppler  03/04/2011  *RADIOLOGY REPORT*  Clinical Data:  Left pelvic pain, evaluate for ovarian cyst / torsion  TRANSABDOMINAL AND TRANSVAGINAL ULTRASOUND OF PELVIS DOPPLER ULTRASOUND OF OVARIES  Technique:  Both transabdominal and transvaginal ultrasound examinations of the pelvis were performed. Transabdominal technique was performed for global imaging of the pelvis including uterus, ovaries, adnexal regions, and pelvic cul-de-sac.  It was necessary to proceed with endovaginal exam following the transabdominal exam to visualize the endometrium.  Color and duplex Doppler ultrasound was utilized to evaluate blood flow to the ovaries.  Comparison:  CT abdomen/pelvis dated 08/28/2010.  Findings:  Uterus:  Retroverted and mildly heterogeneous.  Measures 8.2 x 4.3 x 5.0 cm.  Endometrium:  Normal in thickness and appearance, measuring 10 mm.  Right ovary: Poorly visualized.  Measures approximately 3.6 x 2.8 x 2.3 cm.  Left ovary:   Normal appearance/no adnexal mass, measuring 3.6 x 2.6 x 2.5 cm, notable for a corpus luteal cyst.  Additional comments:  Trace pelvic ascites.  Pulsed Doppler evaluation demonstrates normal low-resistance arterial and venous waveforms in both ovaries.  IMPRESSION: Normal exam.  No evidence of pelvic mass or other significant abnormality.  Left corpus luteal cyst.  No sonographic evidence for ovarian torsion.  Original Report Authenticated By: Charline Bills, M.D.   Medications: I have reviewed the patient's current medications. Scheduled Meds:   . aspirin  325 mg Oral Daily  . clopidogrel  75 mg Oral Q breakfast  . docusate sodium  100 mg Oral BID  . enoxaparin  40 mg Subcutaneous Daily  . ferrous gluconate  325 mg Oral BID WC  . Fluticasone-Salmeterol  1 puff Inhalation BID  . insulin aspart  0-5 Units Subcutaneous QHS  . insulin aspart  0-9 Units Subcutaneous TID  WC  . insulin aspart  3 Units Subcutaneous TID WC  . insulin glargine  30 Units Subcutaneous QHS  . metroNIDAZOLE  500 mg Oral Q12H  . ondansetron (ZOFRAN) IV  4 mg Intravenous Once  . pantoprazole  40 mg Oral Q1200  . potassium chloride  10 mEq Intravenous Q1 Hr x 2  . senna  1 tablet Oral Daily  . DISCONTD: insulin aspart  0-20 Units Subcutaneous TID WC  . DISCONTD: insulin aspart  0-5 Units Subcutaneous QHS  . DISCONTD: metoprolol tartrate  25 mg Oral BID   Continuous Infusions:   . sodium chloride 125 mL/hr at 03/06/11 0721   PRN Meds:.acetaminophen, clonazePAM, oxyCODONE-acetaminophen, promethazine Assessment/Plan: Pt is a 35yo woman with multiple comorbidities including fibromyalgia, depression, and treatment with H. pylori, presents with LLQ abdominal pain, nausea, and vomiting. Pt also reports vaginal itching and white discharge.   # Abdominal pain- Etiology remains unclear. She has had multiple GI workups in the past for similar symptoms with no underlying cause discerned. Urine pregnancy test (quantitative & qualitative) were negative, thus ectopic pregnancy is not in the differential. Pelvic US as well as the CT abdomen and pelvis showed only a left luteal cyst but otherwise no acute findings.  Cyst unlikely to cause this degree of pain.  PID is a possibility and will be evaluated with GC/Chlamydia test, although patient did not experience cervical motion tenderness on physical exam.  It is unclear if patient had been taking opiates outpatient, but constipation may be contributing as well.  Finally, patient's UDS was positive for cocaine, and thus abdominal pain may be 2/2 arterial vasospasms. Furthermore consider IBS especially in the setting of chronic elevated ESR , CRP and WBC.  Given history of polysubstance abuse and UDS positive for cocaine & opiates, patient may be undergoing opiate withdrawal.  -Pain control with prn Percocet, paired with colace & senna for constipation    -N/V control with prn phenergan  -GI consult today   #Vaginal Discharge: micro revealed clue cells, therefore will treat for bacterial vaginosis  -Metronidazole 500mg  PO BID   # HTN- Elevated since admission.  -Start HCTZ, d/c IVF, will discontinue selective betablocker  given h/o cocaine abuse and start labetalol (nonselective)  # History of Hep B- Stable.  -Follow up Hep B surface was positive, & Hep e antigen pending  -Hep A antibody test   # Hypokalemia: Repleted. Will recheck BMET today and replete again if needed.   #. DM- Possible DKA. HbA1c is 11. Patient notes compliance with home dose insulin. She reports she is not taking metformin 2/2 nausea side effects. Will continue to assess blood glucose levels and control with SSI.  -SSI and increase lantus dose to 35 units qHS   #Questionable AG metabolic acidosis resolved.   #Polysubstance abuse: UDS positve for cocaine.  -Social work consult   #. History of CAD- Stable on admission. No CP  -Continue home ASA and Plavix  -Lipid panel suggests HLD, will start statin when pain symptoms resolve with goal LDL <100   # Depression/Anxiety- Stable on admission.  -Continue home Klonopin   # Anemia- Baseline Hgb 9-11,Stable at this point. Chronic anemia likely due to iron deficiency complicated by heavy menstrual bleeding and noncompliance with home iron supplements. Chart review suggests Hemoglobin C, and electrophoresis in 2009, but no documentation of electrophoresis itself. Per Hematology notes from 02/2010 unlikely sine patient have to be homozygous  -Restart iron supplements once N/V resolves  -Daily CBC   # Leukocytosis- Seems to be improving. WBC 18.7 on admission, 13.5 this morning. Patient has been treated with flagyl for BV.  Will continue to monitor. May be related to chronic hepatitis. In Jan 2012, Dr. Darrold Span was consulted and was not  convinced of CML at that time. ESR elevated with 36 but it has been ranging between 45-48  as well CRP elevated with 0.63 again has been elevated between 0.4-2.7 which most consistent with an inflammatory process.  -Daily CBC  -Will follow up on LAP   # History of thrombocytosis- Likely secondary to chronic inflammation and iron deficiency anemia. Will continue to monitor.  -Daily CBC    LOS: 3 days   Vernice Jefferson 03/06/2011, 10:04 AM  Internal Medicine Teaching Service Attending Note Date: 03/06/2011  Patient name: Shirley [name]  Medical record number: [id number]  Date of birth: Aug 01, 1975    This patient has been seen and discussed with the house staff. Please see their note for complete details. I concur with their findings with the following additions/corrections: My great appreciation to Dr Milbert Coulter for her her excellent and very thorough note.  1) Abd pain- appreciate GI f/u who has seen her before. 2) heme eval, ? Bone marrow bx. 3) Hep B- needs to be screened for Hep A to see if she needs to be vaccinated Johny Sax 03/06/2011, 11:40 AM

## 2011-03-06 NOTE — Progress Notes (Signed)
Increased BP (170126) noted even after patient was given Percocet, Phenergan, Metoprolol, and Klonopin.  Patient continues to c/o abdominal pain and nausea.  MD notified of elevated BP despite medications.  Will continue to monitor and administer PRN pain and nausea medications as ordered.  Shirley [name]

## 2011-03-06 NOTE — Consult Note (Addendum)
Agree with Ms. Heron Nay assessment and plan. Await celiac studies Note that she has increased abdominal tenderness with abdominal wall muscle tension (+ Carnett's sign) which is likely from the vomiting.  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 Other comorbidities and substance abuse makes treatment problematic.  She has menorrhagia which may be cause of her anemia. I do not think hemoccults will change what we do - will consider possible colonoscopy but would follow-up celiac studies first. Will cancel hemoccults.  Iva Boop, MD, [facility]   She also has chronic hepatitis B. Additional studies ordered. I have concerns about her candidacy for treatment, even if indicated. She will need infection transmission prevention counseling if she has not had. 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 Pylori gastritis found 2011 - may need a repeat EGD to reassess but await withdrawal from cocaine to pass.

## 2011-03-06 NOTE — Consult Note (Signed)
[facility] Consult: 12:42 PM 03/06/2011   Referring Provider: Meredith Pel  Primary Care Physician:  Kristie Cowman, MD, MD Primary Gastroenterologist:Seen once by DR. Russella Dar in Dec 2011 while inpt.  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 The patient had cath in December 2010 demonstrated no restenosis or significant coronary artery disease progression. In addition, she also has multiple admissions for abdominal pain and N/V since 02/2010 with negative work-up with the most recent admission in April 2012. She presents to the ED today for left lower abdominal pain, N/V x 2 days in duration with the last episode of Vomiting in the ED. Denies any bloody vomitous or fever or chills. She states that the pain is sharp, intermittent, 10/10 in severity, no radiation, exacerbation factor is N/V and is alleviated by Percocet and "laying on it, rocking". Food does not aggravate her pain nor make it better.  Dr. Russella Dar did inpt EGD in Nov 21011.  She failed to show up for subsequent outpt appointments. Per patient's report, she started having abdominal pain after she was diagnosed with H. Pylori in 02/2010 and was treated twice for this.   She has had a CT abdomen and trans vaginal ultrasound this admit.  Multiple prior CT scans as well.  No findings to explain the subjective degree of pain.  Gastric emptying study in Oct 2011 was negative, though it is suspected she has gastroparesis.  Denies use of NSAIDS does take aspirin every day. Getting her narcotics through the ED. Emesis is not bloody.  BMs every other day, no blood or melena.  Past Medical  History:   Thrombocytosis                                                 Comment:Hem/Onc suggested 2/2 chronic hepatits and/or               iron deficiency anemia   Iron deficiency anemia                                       N&V (nausea and vomiting)                                      Comment:and  Abdominal pain, chronic. Unclear ethiology              with multiple admission and ED visits. Work up               including Abdominal xray showed mostly               nonspecific bowel gas pattern, CT abdomen with               and without contrast (01/2010)  showed no acute              process. Gastic Emptying scan (01/2010)  was               normal. Ultrasound of the abdomen was within               normal limits. Hepatitis B viral load was               undectable. HIV nonreactive. EGD (11/09/   Abdominal pain                                               Hypertension                                                 Hyperlipidemia                                               Diabetes mellitus                                            Recurrent boils                                              Back pain                                                    OSA (obstructive sleep apnea)                                CAD (coronary artery disease)                                  Comment:s/p Subendocardial MI with PDA angioplasty(no               stent) on 06/15/06 and relook  cath 06/19/06               showed patency of site. Cath 12/10- no               restenosis or significant CAD progression   Irregular menses                                               Comment:Small ovarian follicles seen on CT(9/06)   History of pyelonephritis                                      Comment:H/o GrpB Pyelonephritis (9/06) and UTI- 07/11-  E.Coli, 12/10- GBS   Abscess of tunica vaginalis                                    Comment:10/09- Abundant S. aureus- sensitive to all abx    Obesity                                                      Asthma                                                       Gastritis                                                    Depression                                                   Hepatitis B, chronic                                           Comment:Hep BeAb+,Hep B cAb+ & Hep BsAg+ (9/06)   Peripheral neuropathy                                        Fibromyalgia                                                 GERD (gastroesophageal reflux disease)                       MI (myocardial infarction)                      2008         Migraine                                                     Anxiety                                                      Urinary tract infection  Past Surgical History:   CESAREAN SECTION                                1997        Prior to Admission medications : acetaminophen (TYLENOL) 500 MG tablet, Sig Take 1,000 mg by mouth every 6 (six) hours as needed.  ADVAIR DISKUS 100-50 MCG/DOSE AEPB, Sig INHALE ONE PUFF TWO TIMES A DAY, Start Date 05/31/10, s aspirin 325 MG tablet, Sig Take 325 mg by mouth daily.  ,  clonazePAM (KLONOPIN) 0.5 MG tablet, Sig Take 0.5 mg by mouth 3 (three) times daily as needed. For anxiety.,  clopidogrel (PLAVIX) 75 MG tablet, Sig Take 75 mg by mouth daily.  ferrous gluconate (FERGON) 325 MG tablet, Sig Take 325 mg by mouth daily with breakfast.  insulin glargine (LANTUS SOLOSTAR) 100 UNIT/ML injection, Sig Inject 30 Units into the skin at bedtime  metoprolol tartrate (LOPRESSOR) 25 MG tablet, Sig Take 25 mg by mouth 2 (two) times daily.  omeprazole (PRILOSEC) 40 MG capsule, Sig Take 40 mg by mouth daily.  OxyCODONE-acetaminophen (PERCOCET) 5-325 MG per tablet, Sig Take 1 tablet by mouth every 4 (four) hours as needed. For pain. Promethazine (PHENERGAN) 12.5 MG tablet, Sig Take 12.5 mg by mouth every 6 (six) hours as needed. For  nausea/vomiting., Start Date , End Date , Taking? Yes, Authorizing Provider Historical Provider, MD   Current Facility-Administered Medications: acetaminophen (TYLENOL) tablet 325 mg, 325 mg, Oral, Q6H PRN, Jaseela Illath  aspirin tablet 325 mg, 325 mg, Oral, Daily, Carrolyn Meiers, 325 mg at 03/06/11 0948 clonazePAM (KLONOPIN) tablet 0.5 mg, 0.5 mg, Oral, TID PRN, Carrolyn Meiers, 0.5 mg at 03/05/11 2124 clopidogrel (PLAVIX) tablet 75 mg, 75 mg, Oral, Q breakfast, Carrolyn Meiers, 75 mg at 03/06/11 0815 docusate sodium (COLACE) capsule 100 mg, 100 mg, Oral, BID, Vernice Jefferson, MD enoxaparin (LOVENOX) injection 40 mg, 40 mg, Subcutaneous, Daily, Carrolyn Meiers, 40 mg at 03/06/11 0948 ferrous gluconate (FERGON) tablet 325 mg, 325 mg, Oral, BID WC, Carrolyn Meiers, 324 mg at 03/06/11 0815 Fluticasone-Salmeterol (ADVAIR) 100-50 MCG/DOSE inhaler 1 puff, 1 puff, Inhalation, BID, Carrolyn Meiers hydrochlorothiazide (HYDRODIURIL) tablet 25 mg, 25 mg, Oral, Daily, Vernice Jefferson, MD, 25 mg at 03/06/11 1130 insulin aspart (novoLOG) injection 0-5 Units, 0-5 Units, Subcutaneous, QHS, Jaseela Illath insulin aspart (novoLOG) injection 0-9 Units, 0-9 Units, Subcutaneous, TID WC, Jaseela Illath, 2 Units at 03/06/11 0816 insulin aspart (novoLOG) injection 3 Units, 3 Units, Subcutaneous, TID WC, Jaseela Illath insulin glargine (LANTUS) injection 35 Units, 35 Units, Subcutaneous, QHS, Neema Kapadia, MD labetalol (NORMODYNE) tablet 50 mg, 50 mg, Oral, BID, Vernice Jefferson, MD metroNIDAZOLE (FLAGYL) tablet 500 mg, 500 mg, Oral, Q12H, Vernice Jefferson, MD, 500 mg at 03/06/11 0948 ondansetron (ZOFRAN) injection 4 mg, 4 mg, Intravenous, Once, Kristie Cowman, MD oxyCODONE-acetaminophen (PERCOCET) 5-325 MG per tablet 1 tablet, 1 tablet, Oral, Q4H PRN, Carrolyn Meiers, 1 tablet at 03/06/11 1130 pantoprazole (PROTONIX) EC tablet 40 mg, 40 mg, Oral, Q1200, Carrolyn Meiers, 40 mg at 03/06/11 1130 potassium chloride 10 mEq in 100 mL IVPB, 10 mEq, Intravenous, Q1 Hr x  2, Jaseela Illath, 10 mEq at 03/05/11 2200 potassium chloride 10 mEq in 100 mL IVPB, 10 mEq, Intravenous, Q1 Hr x 4, Neema Kapadia, MD promethazine (PHENERGAN) injection 12.5 mg, 12.5 mg, Intravenous, Q4H PRN, Jaseela Illath, 12.5 mg at 03/06/11 1131 senna (SENOKOT) tablet 8.6 mg, 1 tablet, Oral, Daily, Vernice Jefferson, MD DISCONTD: 0.9 %  sodium chloride infusion, , Intravenous, Continuous, Almyra Deforest, Last Rate: 125 mL/hr at 03/06/11 1610 DISCONTD: insulin aspart (novoLOG) injection 0-20 Units, 0-20 Units, Subcutaneous, TID WC, Ankit Garg DISCONTD: insulin aspart (novoLOG) injection 0-5 Units, 0-5 Units, Subcutaneous, QHS, Ankit Garg, 2 Units at 03/04/11 2134 DISCONTD: insulin glargine (LANTUS) injection 30 Units, 30 Units, Subcutaneous, QHS, Carrolyn Meiers, 30 Units at 03/05/11 2124 DISCONTD: metoprolol tartrate (LOPRESSOR) tablet 25 mg, 25 mg, Oral, BID, Carrolyn Meiers, 25 mg at 03/05/11 2124    Allergies as of 03/03/2011 - Review Complete 03/03/2011  -- Lisinopril --  -- noted 03/17/2008   Review of patient's family history indicates:   Diabetes                       Father                   Social History   Marital Status: Single              Spouse Name:                      Years of Education:                 Number of children:             Occupational History Occupation          Associate Professor            Comment              other                                   unemployed  Social History Main Topics   Smoking Status: Former Smoker                   Packs/Day:       Years:           Types: Cigarettes     Quit date: 04/24/1996   Smokeless Status: Not on file                      Alcohol Use: No             Drug Use: No             Sexual Activity: Not on file        Other Topics            Concern   None on file  Social History Narrative   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   Also works at [employer] having to lift  elderly individuals. 2 kids:65 & 5 years old.       REVIEW OF SYSTEMS: Constitutional: she feels lousy she says she has lost weight.weight in October 2011 was 105.9 kg, currently is 99.2 kg ENT:  No nosebleed, no nasal discharge Pulm:  No cough. Does get exertional angina. CV:  No palpitations no chest pain GU:  No dysuria no hematuria GI:  This is described above Heme:  No prior transfusions. No unusual bleeding   Transfusions:  none Neuro:  She gets tremors in her legs and a shake a lot when she is in acute abdominal pain. Derm:  No rash no itching Endocrine:  Not good about checking her  sugars. Periods regular Immunization:  *influenza immunization status unknown** Travel:  *no recent travel outside the country.**   PHYSICAL EXAM: Vital signs in last 24 hours: Temp:  [98.1 F (36.7 C)-98.8 F (37.1 C)] 98.8 F (37.1 C) (11/11 2200) weight 99.2 kg (218 #) Pulse Rate:  [100-124] 100  (11/12 0645) Resp:  [14-20] 14  (11/12 0645) BP: (123-180)/(79-129) 123/79 mmHg (11/12 0645) SpO2:  [99 %-100 %] 99 % (11/12 0645) Last BM Date: 02/28/11  General: obese, slumped over in the bed. Torso moving back-and-forth it is sort of self comforting way. She is obviously uncomfortable. Head:  No signs of trauma. Otherwise she is disheveled, fallse eyelashes are in place along with other I. Makeup. Eyes:  No pallor of the conjunctiva Ears:  Hearing is not compromised  Nose:  No nasal just Mouth:  No exudates in the pharynx, no oral lesions. Mucosa is pink and moist Neck:  No JVD no masses Lungs:  Clear to auscultation bilaterally. No shortness of breath. No cough Heart: regular rate and rhythm with S1 S2 present. No murmurs rubs gallops. Abdomen:  Obese, hypoactive bowel sounds, nondistended, tenderness is present and nonfocal but there is no guarding or rebound. Subjective discomfort is greater than objective findings.   Rectal: defer   Musc/Skeltl: no joint deformity Extremities:  No  pedal edema  Neurologic:  Lower legs are writhing. Alert and oriented x3  Skin:  No rashes, no sores on the lower extremities Tattoos:  Tattoos present on both upper extremities. Nodes:  Cervical adenopathy, no inguinal adenopathy.   Psych:  Disengaged, does not look viewer in the eye.  Intake/Output from previous day: 11/11 0701 - 11/12 0700 In: 4127.9 [I.V.:3727.9; IV Piggyback:400] Out: 1900 [Urine:1900] Intake/Output this shift:    LAB RESULTS: [facility]   03/05/11  03/04/11  03/03/11             0656      0600      2052      WBC        13.5*     19.4*     18.7*     HGB        10.2*     9.9*      11.0*     HCT        32.3*     31.8*     35.1*     PLT        380       545*      625*      BMET NA              136   03/06/2011 NA              140   03/05/2011 NA              131   03/05/2011 K               3.2   03/06/2011 K               3.2   03/05/2011 K               4.5   03/05/2011 CL              102   03/06/2011 CL              105   03/05/2011 CL  100   03/05/2011 CO2              24   03/06/2011 CO2              26   03/05/2011 CO2              17   03/05/2011 GLUCOSE         207   03/06/2011 GLUCOSE         114   03/05/2011 GLUCOSE         184   03/05/2011 BUN               6   03/06/2011 BUN               5   03/05/2011 BUN               5   03/05/2011 CREATININE     0.60   03/06/2011 CREATININE     0.57   03/05/2011 CREATININE     0.52   03/05/2011 CALCIUM         9.0   03/06/2011 CALCIUM         8.8   03/05/2011 CALCIUM         8.8   03/05/2011 LFT PROT         9.2   03/03/2011 PROT         8.2   02/18/2011 PROT         8.7   02/10/2011 ALBUMIN      3.9   03/03/2011 ALBUMIN      3.7   02/18/2011 ALBUMIN      3.6   02/10/2011 AST           15   03/03/2011 AST           12   02/18/2011 AST           13   02/10/2011 ALT            6   03/03/2011 ALT            6   02/18/2011 ALT            6   02/10/2011 ALKPHOS       87    03/03/2011 ALKPHOS       70   02/18/2011 ALKPHOS       86   02/10/2011 BILITOT      0.4   03/03/2011 BILITOT      0.4   02/18/2011 BILITOT      0.4   02/10/2011 BILIDIR      0.1   01/26/2011 BILIDIR      0.1   08/28/2010 BILIDIR     <0.1   04/06/2010 IBILI        0.4   01/26/2011 IBILI        0.3   08/28/2010 IBILI     *   04/06/2010   Value: NOT CALCULATED PT/INR No results found for this basename:  LABPROT:2,INR:2 in the last 72 hours Hepatitis Panel Basename   03/04/11             1133      HEPBSAG    POSITIVE* HCVAB      --        HEPAIGM    --        HEPBIGM    --        C-Diff No components found with  this name: cdiff  RADIOLOGY STUDIES: US Transvaginal Non-ob  2011-03-11  *RADIOLOGY REPORT*  Clinical Data:  Left pelvic pain, evaluate for ovarian cyst / torsion  TRANSABDOMINAL AND TRANSVAGINAL ULTRASOUND OF PELVIS DOPPLER ULTRASOUND OF OVARIES  Technique:  Both transabdominal and transvaginal ultrasound examinations of the pelvis were performed. Transabdominal technique was performed for global imaging of the pelvis including uterus, ovaries, adnexal regions, and pelvic cul-de-sac.  It was necessary to proceed with endovaginal exam following the transabdominal exam to visualize the endometrium.  Color and duplex Doppler ultrasound was utilized to evaluate blood flow to the ovaries.  Comparison:  CT abdomen/pelvis dated 08/28/2010.  Findings:  Uterus:  Retroverted and mildly heterogeneous.  Measures 8.2 x 4.3 x 5.0 cm.  Endometrium:  Normal in thickness and appearance, measuring 10 mm.  Right ovary: Poorly visualized.  Measures approximately 3.6 x 2.8 x 2.3 cm.  Left ovary:   Normal appearance/no adnexal mass, measuring 3.6 x 2.6 x 2.5 cm, notable for a corpus luteal cyst.  Additional comments:  Trace pelvic ascites.  Pulsed Doppler evaluation demonstrates normal low-resistance arterial and venous waveforms in both ovaries.  IMPRESSION: Normal exam.  No evidence of pelvic mass or other  significant abnormality.  Left corpus luteal cyst.  No sonographic evidence for ovarian torsion.  Original Report Authenticated By: Charline Bills, M.D.   US Pelvis Complete  2011/03/11  *RADIOLOGY REPORT*  Clinical Data:  Left pelvic pain, evaluate for ovarian cyst / torsion  TRANSABDOMINAL AND TRANSVAGINAL ULTRASOUND OF PELVIS DOPPLER ULTRASOUND OF OVARIES  Technique:  Both transabdominal and transvaginal ultrasound examinations of the pelvis were performed. Transabdominal technique was performed for global imaging of the pelvis including uterus, ovaries, adnexal regions, and pelvic cul-de-sac.  It was necessary to proceed with endovaginal exam following the transabdominal exam to visualize the endometrium.  Color and duplex Doppler ultrasound was utilized to evaluate blood flow to the ovaries.  Comparison:  CT abdomen/pelvis dated 08/28/2010.  Findings:  Uterus:  Retroverted and mildly heterogeneous.  Measures 8.2 x 4.3 x 5.0 cm.  Endometrium:  Normal in thickness and appearance, measuring 10 mm.  Right ovary: Poorly visualized.  Measures approximately 3.6 x 2.8 x 2.3 cm.  Left ovary:   Normal appearance/no adnexal mass, measuring 3.6 x 2.6 x 2.5 cm, notable for a corpus luteal cyst.  Additional comments:  Trace pelvic ascites.  Pulsed Doppler evaluation demonstrates normal low-resistance arterial and venous waveforms in both ovaries.  IMPRESSION: Normal exam.  No evidence of pelvic mass or other significant abnormality.  Left corpus luteal cyst.  No sonographic evidence for ovarian torsion.  Original Report Authenticated By: Charline Bills, M.D.   Ct Abdomen Pelvis W Contrast  11-Mar-2011  *RADIOLOGY REPORT*  Clinical Data: Left lower abdominal pain, hepatitis B, cocaine withdrawal  CT ABDOMEN AND PELVIS WITH CONTRAST  Technique:  Multidetector CT imaging of the abdomen and pelvis was performed following the standard protocol during bolus administration of intravenous contrast.  Contrast:  OMNIPAQUE IOHEXOL 300 MG/ML IV SOLN  Comparison: 08/28/2010  Findings: Lung bases are clear.  Liver, spleen, pancreas, and adrenal glands within normal limits.  Gallbladder is unremarkable.  No intrahepatic or extrahepatic ductal dilatation.  Kidneys within normal limits.  No hydronephrosis.  No evidence of bowel obstruction.  Normal appendix.  No colonic wall thickening or inflammatory changes.  No evidence of abdominal aortic aneurysm.  No abdominopelvic ascites.  No suspicious abdominopelvic lymphadenopathy.  Uterus and bilateral ovaries are unremarkable, noting a left corpus luteal cyst.  Bladder is within normal limits.  Mild degenerative changes of the visualized thoracolumbar spine.  IMPRESSION: No evidence of bowel obstruction.  Normal appendix.  Left corpus luteal cyst.  Otherwise, no CT findings to account for the patient's abdominal pain.  Original Report Authenticated By: Charline Bills, M.D.   [place] Art/ven Flow Abd Pelv Doppler  03/04/2011  *RADIOLOGY REPORT*  Clinical Data:  Left pelvic pain, evaluate for ovarian cyst / torsion  TRANSABDOMINAL AND TRANSVAGINAL ULTRASOUND OF PELVIS DOPPLER ULTRASOUND OF OVARIES  Technique:  Both transabdominal and transvaginal ultrasound examinations of the pelvis were performed. Transabdominal technique was performed for global imaging of the pelvis including uterus, ovaries, adnexal regions, and pelvic cul-de-sac.  It was necessary to proceed with endovaginal exam following the transabdominal exam to visualize the endometrium.  Color and duplex Doppler ultrasound was utilized to evaluate blood flow to the ovaries.  Comparison:  CT abdomen/pelvis dated 08/28/2010.  Findings:  Uterus:  Retroverted and mildly heterogeneous.  Measures 8.2 x 4.3 x 5.0 cm.  Endometrium:  Normal in thickness and appearance, measuring 10 mm.  Right ovary: Poorly visualized.  Measures approximately 3.6 x 2.8 x 2.3 cm.  Left ovary:   Normal appearance/no adnexal mass, measuring 3.6 x 2.6 x  2.5 cm, notable for a corpus luteal cyst.  Additional comments:  Trace pelvic ascites.  Pulsed Doppler evaluation demonstrates normal low-resistance arterial and venous waveforms in both ovaries.  IMPRESSION: Normal exam.  No evidence of pelvic mass or other significant abnormality.  Left corpus luteal cyst.  No sonographic evidence for ovarian torsion.  Original Report Authenticated By: Charline Bills, M.D.    NM Gastric Emptying    Oct 11 15:17  Clinical Data:  Nausea, vomiting, abdominal pain    NUCLEAR MEDICINE GASTRIC EMPTYING SCAN  Findings: Gastric retention is 19% at 120 minutes which is within   normal limits.  Normal retention is less than 30% at 2 hours.    Static imaging demonstrates no evidence of gastric outlet   obstruction or small bowel obstruction.  Activity progresses   throughout the small bowel over time.    IMPRESSION:    Normal gastric emptying study.  19% retention at 2 hours.    Read By:  Sigurd Sos.,  M.D.  ENDOSCOPIC STUDIES:  EGD November 2011:  Mild gastritis,  No cause found for chronic n/v/abd pain. Dr. Russella Dar  Pathology:   1. Duodenum, biopsy, :   - DUODENAL MUCOSA WITH VARIABLE VILLOUS BLUNTING, MILD PLASMA CELL LAMINA   [place]   EXPANSION, AND INCREASED INTRAEPITHELIAL LYMPHOCYTES; SEE COMMENT.   - NO GIARDIA PRESENT.    2. Stomach, biopsy, body and antrum :   MODERATE CHRONIC ACTIVE H.PYLORI GASTRITIS (ANTRAL AND OXYNTIC MUCOSA).    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 Although these morphologic features are most likely secondary to the H.pylori   gastritis present, if sprue remains a clinical concern, correlation with   serologic testing and clinical findings post H.pylori eradication is   recommended.CR/eps 03/03/10   IMPRESSION: 1. Chronic abdominal pain, nausea, vomiting. One year ago had upper endoscopy revealing mild gastritis. She's been treated for H.  Pylori on 2 occasions. Home meds include omeprazole. Imaging studies unrevealing as to cause. Symptoms currently worse leading to current hospitalization. Some of her symptoms may be secondary to chronic narcotics. She has a strong functional/psych component to her symptoms.  2. Insulin-dependent diabetes mellitus type 2 hemoglobin A1c reveals  poor control. 3. Microcytic anemia. On iron at home. Hemoccult status undetermined. 4. Obstructive sleep apnea. 5.  Coronary artery disease, had baloon angioplasty in 2008. She is taking Plavix though it's not clear why as she has never had stenting. PLAN: 1. Dr. Elenore Paddy may need to repeat upper endoscopy though I suspect this will be unrevealing.  2. Will order celiac profile since pathology from her small bowel is slightly suggestive of sprue.   LOS: 3 days   Jennye Moccasin  03/06/2011, 12:42 PM

## 2011-03-07 DIAGNOSIS — D509 Iron deficiency anemia, unspecified: Secondary | ICD-10-CM

## 2011-03-07 DIAGNOSIS — R1032 Left lower quadrant pain: Secondary | ICD-10-CM

## 2011-03-07 DIAGNOSIS — R1114 Bilious vomiting: Secondary | ICD-10-CM

## 2011-03-07 DIAGNOSIS — B181 Chronic viral hepatitis B without delta-agent: Secondary | ICD-10-CM | POA: Diagnosis present

## 2011-03-07 LAB — TISSUE TRANSGLUTAMINASE, IGA: Tissue Transglutaminase Ab, IgA: 7.2 U/mL (ref <20)

## 2011-03-07 LAB — GLIADIN ANTIBODIES, SERUM: Gliadin IgA: 8.4 U/mL (ref <20)

## 2011-03-07 LAB — BASIC METABOLIC PANEL
BUN: 10 mg/dL (ref 6–23)
CO2: 23 mEq/L (ref 19–32)
Calcium: 9.2 mg/dL (ref 8.4–10.5)
GFR calc non Af Amer: >90 mL/min (ref >90)
Glucose, Bld: 133 mg/dL — ABNORMAL HIGH (ref 70–99)
Potassium: 4 mEq/L (ref 3.5–5.1)
Sodium: 134 mEq/L — ABNORMAL LOW (ref 135–145)

## 2011-03-07 LAB — GLUCOSE, CAPILLARY
Glucose-Capillary: 238 mg/dL — ABNORMAL HIGH (ref 70–99)
Glucose-Capillary: 332 mg/dL — ABNORMAL HIGH (ref 70–99)
Glucose-Capillary: 418 mg/dL — ABNORMAL HIGH (ref 70–99)
Glucose-Capillary: 249 mg/dL — ABNORMAL HIGH (ref 70–99)

## 2011-03-07 LAB — HEPATITIS A ANTIBODY, TOTAL: Hep A Total Ab: NEGATIVE

## 2011-03-07 LAB — LEUKOCYTE ALKALINE PHOSPHATASE: Leukocyte Alkaline  Phos Stain: 119 (ref 30–140)

## 2011-03-07 MED ORDER — BOOST / RESOURCE BREEZE PO LIQD
1.0000 | Freq: Two times a day (BID) | ORAL | Status: DC
  Administered 2011-03-08 – 2011-03-10 (×5): 1 via ORAL
  Filled 2011-03-07 (×9): qty 1

## 2011-03-07 MED ORDER — ACETAMINOPHEN 325 MG PO TABS
650.0000 mg | ORAL_TABLET | Freq: Four times a day (QID) | ORAL | Status: DC | PRN
  Administered 2011-03-07 – 2011-03-09 (×6): 650 mg via ORAL
  Administered 2011-03-10 (×2): 325 mg via ORAL
  Filled 2011-03-07: qty 1
  Filled 2011-03-07 (×5): qty 2
  Filled 2011-03-07: qty 1

## 2011-03-07 MED ORDER — INSULIN ASPART 100 UNIT/ML ~~LOC~~ SOLN
6.0000 [IU] | Freq: Three times a day (TID) | SUBCUTANEOUS | Status: DC
  Administered 2011-03-08 – 2011-03-10 (×6): 6 [IU] via SUBCUTANEOUS

## 2011-03-07 MED ORDER — INSULIN ASPART 100 UNIT/ML ~~LOC~~ SOLN
0.0000 [IU] | Freq: Three times a day (TID) | SUBCUTANEOUS | Status: DC
  Administered 2011-03-08 (×2): 11 [IU] via SUBCUTANEOUS
  Administered 2011-03-08 – 2011-03-09 (×2): 4 [IU] via SUBCUTANEOUS
  Administered 2011-03-09: 7 [IU] via SUBCUTANEOUS
  Administered 2011-03-10: 4 [IU] via SUBCUTANEOUS

## 2011-03-07 MED ORDER — INSULIN GLARGINE 100 UNIT/ML ~~LOC~~ SOLN
40.0000 [IU] | Freq: Every day | SUBCUTANEOUS | Status: DC
  Administered 2011-03-07: 40 [IU] via SUBCUTANEOUS

## 2011-03-07 NOTE — Progress Notes (Signed)
     [facility] Daily Rounding Note 03/07/2011, 8:57 AM  SUBJECTIVE: Says nausea and pain persist. OBJECTIVE: No longer writhing nor in fetal position  Looks bwetter and more comfortable.    General: Non toxic.  Looks depressed.  Vital signs in last 24 hours: Temp:  [97.8 F (36.6 C)-98.7 F (37.1 C)] 98.1 F (36.7 C) (11/13 0635) Pulse Rate:  [96-113] 109  (11/13 0635) Resp:  [16-20] 20  (11/13 0635) BP: (100-189)/(68-139) 100/68 mmHg (11/13 0635) SpO2:  [94 %-98 %] 96 % (11/13 0635) Last BM Date: 03/06/11  Heart: RR, slight tachy. Chest: Clear Abdomen: Soft, obese, NT to medium pressure.  BS hypoactive.  Extremities: No edema Neuro/Psych:  More alert, less disengaged.  Intake/Output from previous day: 11/12 0701 - 11/13 0700 In: -  Out: 1800 [Urine:1800]  Intake/Output this shift:    Lab Results:  Basename 03/05/11 0656  WBC 13.5*  HGB 10.2*  HCT 32.3*  PLT 380   BMET  Basename 03/07/11 0520 03/06/11 0845 03/05/11 1518  NA 134* 136 140  K 4.0 3.2* 3.2*  CL 101 102 105  CO2 23 24 26   GLUCOSE 133* 207* 114*  BUN 10 6 5*  CREATININE 0.77 0.60 0.57  CALCIUM 9.2 9.0 8.8   LFT No results found for this basename: PROT,ALBUMIN,AST,ALT,ALKPHOS,BILITOT,BILIDIR,IBILI in the last 72 hours PT/INR No results found for this basename: LABPROT:2,INR:2 in the last 72 hours Hepatitis Panel  Basename 03/04/11 1133  HEPBSAG POSITIVE*  HCVAB --  HEPAIGM --  HEPBIGM --    ASSESMENT: 1. Chronic abdominal pain, nausea, vomiting. One year ago had upper endoscopy revealing mild gastritis. She's been treated for H. Pylori on 2 occasions. Home meds include omeprazole. Imaging studies unrevealing as to cause. Symptoms currently worse leading to current hospitalization. Some of her symptoms may be secondary to chronic narcotics. She has a strong functional/psych component to her symptoms. On po protonix. 2. Insulin-dependent diabetes mellitus type 2 hemoglobin A1c reveals  poor control.  3. Microcytic anemia. On iron at home. Hemoccult status undetermined.  4. Obstructive sleep apnea.  5. Coronary artery disease, had baloon angioplasty in 2008. She is taking Plavix though it's not clear why as she has never had stenting. 6.  Substance abuse, narcotics seeking behavior is chronic. 7. ?Celiac dz.  See that in 03/2010 she had negative TTG ab and TTG Iga so celiac is unlikely.  Repeat test Pending 8. Bact vaginosis.  Day 3 Metronidazole po, not an easy drug for the stomach, tends to cause nausea.  PLAN: 1. Consider antidepressants. 2.  Consider detox from narcotics,  I know this is likely insurmountable task.   LOS: 4 days   Shirley [name]  03/07/2011, 8:57 AM

## 2011-03-07 NOTE — Progress Notes (Signed)
  Internal Medicine Teaching Service Attending Note Date: 03/07/2011  Patient name: Shirley [name]  Medical record number: [id number]  Date of birth: January 19, 1976    This patient has been seen and discussed with the house staff. Please see their note for complete details. I concur with their findings with the following additions/corrections: She continues to complain of nausea and dizziness. She ate a large breakfast this AM.  Will have psychiatry see her for evaluation of ? Overlay to her GI pain.  Appreciate GI f/u.  Continue to detox her, decrease opiates as we can.  Continue to try and improve her DM management.  Johny Sax 03/07/2011, 11:53 AM

## 2011-03-07 NOTE — Progress Notes (Signed)
Agree with Ms. Heron Nay note.  Suspect multifactorial abdominal pain.  Add Chronic Hepatitis B to list of issues (not causing problems).  Think supportive care best course and no more diagnostic evaluation likely to be needed.

## 2011-03-07 NOTE — Progress Notes (Signed)
INITIAL ADULT NUTRITION ASSESSMENT Date: 03/07/2011   Time: 12:43 PM   Reason for Assessment: Nutrition Risk Report  ASSESSMENT: Female 35 y.o.  Dx: abdominal pain, N/V  Hx:  Past Medical History  Diagnosis Date  . Thrombocytosis     Hem/Onc suggested 2/2 chronic hepatits and/or iron deficiency anemia  . Iron deficiency anemia   . N&V (nausea and vomiting)     and  Abdominal pain, chronic. Unclear ethiology with multiple admission and ED visits. Work up including Abdominal xray showed mostly nonspecific bowel gas pattern, CT abdomen with and without contrast (01/2010)  showed no acute process. Gastic Emptying scan (01/2010) was normal. Ultrasound of the abdomen was within normal limits. Hepatitis B viral load was undectable. HIV nonreactive. EGD (11/09/  . Abdominal pain   . Hypertension   . Hyperlipidemia   . Diabetes mellitus   . Recurrent boils   . Back pain   . OSA (obstructive sleep apnea)   . 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 Cath 12/10- no restenosis or significant CAD progression  . Irregular menses     Small ovarian follicles seen on CT(9/06)  . History of pyelonephritis     H/o GrpB Pyelonephritis (9/06) and UTI- 07/11- E.Coli, 12/10- GBS  . Abscess of tunica vaginalis     10/09- Abundant S. aureus- sensitive to all abx  . Obesity   . Asthma   . Gastritis   . Depression   . Hepatitis B, chronic     Hep BeAb+,Hep B cAb+ & Hep BsAg+ (9/06)  . Peripheral neuropathy   . Fibromyalgia   . GERD (gastroesophageal reflux disease)   . MI (myocardial infarction) 2008  . Migraine   . Anxiety   . Urinary tract infection     Related Meds:     . aspirin  325 mg Oral Daily  . clopidogrel  75 mg Oral Q breakfast  . docusate sodium  100 mg Oral BID  . enoxaparin  40 mg Subcutaneous Daily  . ferrous gluconate  325 mg Oral BID WC  . Fluticasone-Salmeterol  1  puff Inhalation BID  . hydrochlorothiazide  25 mg Oral Daily  . insulin aspart  0-5 Units Subcutaneous QHS  . insulin aspart  0-9 Units Subcutaneous TID WC  . insulin aspart  3 Units Subcutaneous TID WC  . insulin glargine  35 Units Subcutaneous QHS  . ketorolac  30 mg Intravenous Once  . labetalol  50 mg Oral BID  . metroNIDAZOLE  500 mg Oral Q12H  .  morphine injection  2 mg Intravenous Once  . ondansetron (ZOFRAN) IV  4 mg Intravenous Once  . pantoprazole  40 mg Oral Q1200  . potassium chloride  10 mEq Intravenous Q1 Hr x 4  . senna  1 tablet Oral Daily     Ht: 5\' 7"  (170.2 cm)  Wt: 218 lb 11.1 oz (99.2 kg)  Ideal Wt: 61.3 kg % Ideal Wt: 162%  Usual Wt: --- % Usual Wt: ---  Body mass index is 34.25 kg/(m^2).  Food/Nutrition Related Hx: unintentional weight loss per nutrition screen  Labs: Sodium 134 mEq/L L     Potassium 4.0 mEq/L      Chloride 101 mEq/L      CO2 23 mEq/L      Glucose, Bld 133 mg/dL H     BUN 10 mg/dL  Creatinine, Ser 0.77 mg/dL      Calcium 9.2 mg/dL    I/O last 3 completed shifts: In: 1927.9 [I.V.:1627.9; IV Piggyback:300] Out: 2500 [Urine:2500] Total I/O In: 480 [P.O.:480] Out: -     Diet Order: Carbohydrate Modified Medium  Supplements/Tube Feeding: N/A  Estimated Nutritional Needs:   Kcal: 1,700-1,900 Protein: 80-90 gms Fluid: 1.7-1.9 L  Pt states appetite is poor, PO intake 0-100% per records -- ate breakfast well this AM per tray observation; reports weight loss x 1 month -- unable to quantify amount; continues with abdominal pain and N/V; amenable to [employer] supplement --- RD to order per protocol.  NUTRITION DIAGNOSIS: -Inadequate oral intake (NI-2.1).  Status: Ongoing  RELATED TO: N/V, abdominal pain  AS EVIDENCE BY: pt report, unintended weight loss  MONITORING/EVALUATION(Goals): Goal: meet >90% of estimated nutrition needs to prevent further weight loss Monitor: PO intake, labs, weight  EDUCATION NEEDS: -No  education needs identified at this time  INTERVENTION:  Add [employer]ze supplement PO BID  RD to follow for nutrition care plan  Dietitian #: [phone]  DOCUMENTATION CODES Per approved criteria  -Obesity Unspecified    Kirkland Hun, RD, LDN 03/07/2011, 12:43 PM

## 2011-03-07 NOTE — Progress Notes (Signed)
Pt requested  Ultram for her abd pain. Md made aware of her request. No new orders received. Will cont. To monitor pt.

## 2011-03-07 NOTE — Progress Notes (Signed)
Subjective: I assessed patient yesterday around 5pm for significant pain & high BP & subsequently treated with IV toradol & labetalol, as she was not taking POs.  She reportedly was able to later tolerate POs and took 1 tab PO percocet and PO labetalol.  She subsequently through the night.  Denies having dinner or lunch yesterday.  No appetite this morning, but I woke her from sleep.  Notes 1 episode of vomiting overnight, but I was not able to confirm with RN.  Confirmed vomitus yesterday after lunch, and many episodes of retching after words.  Reports soreness of LLQ today.  Normal BM this morning & yesterday AM as per RN.    Objective: Vital signs in last 24 hours: Filed Vitals:   03/06/11 0645 03/06/11 1400 03/06/11 2055 03/07/11 0635  BP: 123/79 189/139 128/85 100/68  Pulse: 100 113 96 109  Temp:  97.8 F (36.6 C) 98.7 F (37.1 C) 98.1 F (36.7 C)  TempSrc:  Oral Oral Oral  Resp: 14 20 16 20   Height:      Weight:      SpO2: 99% 94% 98% 96%   Weight change:   Intake/Output Summary (Last 24 hours) at 03/07/11 0830 Last data filed at 03/06/11 1700  Gross per 24 hour  Intake      0 ml  Output   1800 ml  Net  -1800 ml   Physical Exam: General: resting in bed, sleeping, but easily arousable, NAD  HEENT: PERRL, EOMI, no scleral icterus  Cardiac: tachycardic, no rubs, murmurs or gallops  Pulm: clear to auscultation bilaterally, moving normal volumes of air  Abd: soft, nontender, nondistended, obese, normoactive BS, no pain with deep auscultation or palpation of LLQ Ext: warm and well perfused, no pedal edema  Neuro: alert and oriented X3, cranial nerves II-XII grossly intact, strength and sensation to light touch equal in bilateral upper and lower extremities  Lab Results: Basic Metabolic Panel:  Lab 03/07/11 1610 03/06/11 0845  NA 134* 136  K 4.0 3.2*  CL 101 102  CO2 23 24  GLUCOSE 133* 207*  BUN 10 6  CREATININE 0.77 0.60  CALCIUM 9.2 9.0  MG -- 1.7  PHOS -- --    Liver Function Tests:  Lab 03/03/11 2052  AST 15  ALT 6  ALKPHOS 87  BILITOT 0.4  PROT 9.2*  ALBUMIN 3.9    Lab 03/03/11 2052  LIPASE 22  AMYLASE --   CBC:  Lab 03/05/11 0656 03/04/11 0600 03/03/11 2052  WBC 13.5* 19.4* --  NEUTROABS -- -- 16.8*  HGB 10.2* 9.9* --  HCT 32.3* 31.8* --  MCV 59.2* 59.7* --  PLT 380 545* --   CBG:  Lab 03/07/11 0831 03/06/11 2028 03/06/11 1637 03/06/11 1123 03/06/11 0730 03/05/11 2044  GLUCAP 238* 145* 180* 198* 177* 142*   Hemoglobin A1C:  Lab 03/04/11 0907  HGBA1C 11.0*   Fasting Lipid Panel:  Lab 03/04/11 0857  CHOL 204*  HDL 50  LDLCALC 126*  TRIG 140  CHOLHDL 4.1  LDLDIRECT --   Anemia Panel:  Lab 03/04/11 0907  VITAMINB12 448  FOLATE 12.9  FERRITIN 11  TIBC 428  IRON 23*  RETICCTPCT 1.5   GC/Chlamyda probe: negative  Micro Results: Recent Results (from the past 240 hour(s))  WET PREP, GENITAL     Status: Abnormal   Collection Time   03/04/11  6:42 AM      Component Value Range Status Comment   Yeast, Wet Prep NONE  SEEN  NONE SEEN  Final    Trich, Wet Prep NONE SEEN  NONE SEEN  Final    Clue Cells, Wet Prep MANY (*) NONE SEEN  Final    WBC, Wet Prep HPF POC FEW (*) NONE SEEN  Final   CULTURE, BLOOD (ROUTINE X 2)     Status: Normal (Preliminary result)   Collection Time   03/04/11  9:43 AM      Component Value Range Status Comment   Specimen Description BLOOD LEFT ARM   Final    Special Requests     Final    Value: BOTTLES DRAWN AEROBIC AND ANAEROBIC 10CC BLUE,5CC RED   Setup Time [id number]   Final    Culture     Final    Value:        BLOOD CULTURE RECEIVED NO GROWTH TO DATE CULTURE WILL BE HELD FOR 5 DAYS BEFORE ISSUING A FINAL NEGATIVE REPORT   Report Status PENDING   Incomplete   CULTURE, BLOOD (ROUTINE X 2)     Status: Normal (Preliminary result)   Collection Time   03/04/11  9:50 AM      Component Value Range Status Comment   Specimen Description BLOOD LEFT HAND   Final    Special  Requests     Final    Value: BOTTLES DRAWN AEROBIC AND ANAEROBIC 10CC BLUE,5CC RED   Setup Time [id number]   Final    Culture     Final    Value:        BLOOD CULTURE RECEIVED NO GROWTH TO DATE CULTURE WILL BE HELD FOR 5 DAYS BEFORE ISSUING A FINAL NEGATIVE REPORT   Report Status PENDING   Incomplete    Medications: I have reviewed the patient's current medications. Scheduled Meds:   . aspirin  325 mg Oral Daily  . clopidogrel  75 mg Oral Q breakfast  . docusate sodium  100 mg Oral BID  . enoxaparin  40 mg Subcutaneous Daily  . ferrous gluconate  325 mg Oral BID WC  . Fluticasone-Salmeterol  1 puff Inhalation BID  . hydrochlorothiazide  25 mg Oral Daily  . insulin aspart  0-5 Units Subcutaneous QHS  . insulin aspart  0-9 Units Subcutaneous TID WC  . insulin aspart  3 Units Subcutaneous TID WC  . insulin glargine  35 Units Subcutaneous QHS  . ketorolac  30 mg Intravenous Once  . labetalol  50 mg Oral BID  . metroNIDAZOLE  500 mg Oral Q12H  .  morphine injection  2 mg Intravenous Once  . ondansetron (ZOFRAN) IV  4 mg Intravenous Once  . pantoprazole  40 mg Oral Q1200  . potassium chloride  10 mEq Intravenous Q1 Hr x 4  . senna  1 tablet Oral Daily  . DISCONTD: insulin glargine  30 Units Subcutaneous QHS   Continuous Infusions:   . DISCONTD: sodium chloride 125 mL/hr at 03/06/11 0721   PRN Meds:.acetaminophen, clonazePAM, labetalol, ondansetron (ZOFRAN) IV, oxyCODONE-acetaminophen, promethazine  Assessment/Plan: Pt is a 35yo woman with multiple comorbidities including fibromyalgia, depression, and treatment with H. pylori, presents with LLQ abdominal pain, nausea, and vomiting. Pt also reports vaginal itching and white discharge.   # Abdominal pain- Etiology remains unclear. She has had multiple GI workups in the past for similar symptoms with no underlying cause discerned. Pelvic US as well as the CT abdomen and pelvis showed only a left luteal cyst but otherwise no acute  findings. Cyst unlikely to cause this degree  of pain. It is unclear if patient had been taking opiates outpatient, but constipation may be contributing as well, though patient has had BM over the last two days. Patient's UDS was positive for cocaine, and thus abdominal pain may be 2/2 arterial vasospasms. Given history of polysubstance abuse and UDS positive for cocaine & opiates, patient may be undergoing opiate withdrawal.  Finally, N/V may be 2/2 celiacs disease given history of suggestive duodenal biopsy. Urine pregnancy test (quantitative & qualitative) were negative, thus ectopic pregnancy is not in the differential. PID is unlikely as  GC/Chlamydia test was negative and patient did not experience cervical motion tenderness on physical exam.   -Pain control with tylenol, will discontinue percocet as that is likely worsening the situation -Pt continues to decline senna/colace as she seems to be having daily BMs x 2 days -N/V control with prn phenergan and zofran -follow up with celiac workup   #Vaginal Discharge: micro revealed clue cells, therefore will treat for bacterial vaginosis  -Metronidazole 500mg  PO BID   # HTN- Elevated since admission. Normotensive this AM. -Patient required IV labetalol yesterday as she was not taking POs.  Will try HCTZ & PO labetalol today.  Discontinued metoprolol yesterday since it is a selective BB and patient has recent history of cocaine abuse.  Continue to monitor.  # History of Hep B- Stable. Pt may not be candidate for tx given history of noncompliance with medications and polysubstance abuse. -Follow up Hep B surface was positive, & Hep e antigen pending  -Hep A antibody test   # Hypokalemia: Repleted. Mg wnl. Unclear etiology.   #. DM- Possible DKA resolved. HbA1c is 11. Patient notes compliance with home dose insulin. She reports she is not taking metformin 2/2 nausea side effects. Will continue to assess blood glucose levels and control with SSI,  though still poorly controlled  -SSI and increase lantus dose to 40 units qHS   #Questionable AG metabolic acidosis resolved.   #Polysubstance abuse: UDS positve for cocaine.  -Social work consult   #. History of CAD- Stable on admission. No CP  -Continue home ASA and Plavix  -Lipid panel suggests HLD, will start statin when pain symptoms resolve with goal LDL <100   # Depression/Anxiety- No SI/HI, but seems depressed.  There may a somatization component to her abdominal pain and N/V.  -Continue home Klonopin, Consult Psychiatry   # Anemia- Baseline Hgb 9-11,Stable at this point. Chronic anemia likely due to iron deficiency complicated by heavy menstrual bleeding and noncompliance with home iron supplements. Chart review suggests Hemoglobin C, and electrophoresis in 2009, but no documentation of electrophoresis itself. Per Hematology notes from 02/2010 unlikely sine patient have to be homozygous  -Restart iron supplements once N/V resolves  -Daily CBC   # Leukocytosis- Seems to be improving. WBC 18.7 on admission, 13.5 yesterday. Patient has been treated with flagyl for BV. Will continue to monitor. May be related to chronic hepatitis. In Jan 2012, Dr. Darrold Span was consulted and was not convinced of CML at that time. ESR elevated with 36 but it has been ranging between 45-48 as well CRP elevated with 0.63 again has been elevated between 0.4-2.7 which most consistent with an inflammatory process.  -Daily CBC  -Will follow up on LAP   # History of thrombocytosis- Likely secondary to chronic inflammation and iron deficiency anemia. Will continue to monitor.  -Daily CBC    LOS: 4 days   KAPADIA, Diar Berkel 03/07/2011, 8:30 AM

## 2011-03-07 NOTE — Consults (Signed)
Patient Identification:  Shirley [name] Date of Evaluation:  03/07/2011   History of Present Illness: 35 year old female, with history fibromyalgia and history of marijuana and cocaine abuse.  She reports she is sober of cocaine for a few months, but uses marijuana daily. She denies being in any substance abuse rehab facility (inpatient or outpatient). She reports sleep and appetite are both poor.  Denies feeling depressed and having SI.  Denies hearing voices. Denies any past psych hospitalization or suicide attempt.  Was taking Cymbalta, for a year for depression, but then she stopped taking medication, because she felt she did not need it.  She reported her parents have a history of drug abuse.  Patient has multiple medical hospitalizations for abdominal pain, but she denies abusing any pain medications. She lives with her two children, 56 years old and 26 years old.  Patient currently single.    Past Medical History:     Past Medical History  Diagnosis Date  . Thrombocytosis     Hem/Onc suggested 2/2 chronic hepatits and/or iron deficiency anemia  . Iron deficiency anemia   . N&V (nausea and vomiting)     and  Abdominal pain, chronic. Unclear ethiology with multiple admission and ED visits. Work up including Abdominal xray showed mostly nonspecific bowel gas pattern, CT abdomen with and without contrast (01/2010)  showed no acute process. Gastic Emptying scan (01/2010) was normal. Ultrasound of the abdomen was within normal limits. Hepatitis B viral load was undectable. HIV nonreactive. EGD (11/09/  . Abdominal pain   . Hypertension   . Hyperlipidemia   . Diabetes mellitus   . Recurrent boils   . Back pain   . OSA (obstructive sleep apnea)   . 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 Cath 12/10- no restenosis or significant CAD progression  . Irregular menses     Small ovarian  follicles seen on CT(9/06)  . History of pyelonephritis     H/o GrpB Pyelonephritis (9/06) and UTI- 07/11- E.Coli, 12/10- GBS  . Abscess of tunica vaginalis     10/09- Abundant S. aureus- sensitive to all abx  . Obesity   . Asthma   . Gastritis   . Depression   . Hepatitis B, chronic     Hep BeAb+,Hep B cAb+ & Hep BsAg+ (9/06)  . Peripheral neuropathy   . Fibromyalgia   . GERD (gastroesophageal reflux disease)   . MI (myocardial infarction) 2008  . Migraine   . Anxiety   . Urinary tract infection        Past Surgical History  Procedure Date  . Cesarean section 1997    Allergies:  Allergies  Allergen Reactions  . Lisinopril     nauseous    Current Medications:  Prior to Admission medications   Medication Sig Start Date End Date Taking? Authorizing Provider  acetaminophen (TYLENOL) 500 MG tablet Take 1,000 mg by mouth every 6 (six) hours as needed. Pain.  Do not exceed 4000mg  in 24 hours.    Yes Historical Provider, MD  ADVAIR DISKUS 100-50 MCG/DOSE AEPB INHALE ONE PUFF TWO TIMES A DAY 05/31/10  Yes Rameses Vega-Casasnovas  aspirin 325 MG tablet Take 325 mg by mouth daily.     Yes Historical Provider, MD  clonazePAM (KLONOPIN) 0.5 MG tablet Take 0.5 mg by mouth 3 (three) times daily as needed. For anxiety.   Yes  Historical Provider, MD  clopidogrel (PLAVIX) 75 MG tablet Take 75 mg by mouth daily.     Yes Historical Provider, MD  ferrous gluconate (FERGON) 325 MG tablet Take 325 mg by mouth daily with breakfast.     Yes Historical Provider, MD  insulin glargine (LANTUS SOLOSTAR) 100 UNIT/ML injection Inject 30 Units into the skin at bedtime 09/14/10  Yes Rameses Vega-Casasnovas  metoprolol tartrate (LOPRESSOR) 25 MG tablet Take 25 mg by mouth 2 (two) times daily.     Yes Historical Provider, MD  omeprazole (PRILOSEC) 40 MG capsule Take 40 mg by mouth daily.     Yes Historical Provider, MD  oxyCODONE-acetaminophen (PERCOCET) 5-325 MG per tablet Take 1 tablet by mouth every 4  (four) hours as needed. For pain.   Yes Historical Provider, MD  promethazine (PHENERGAN) 12.5 MG suppository Place 12.5 mg rectally every 6 (six) hours as needed. For nausea.    Yes Historical Provider, MD  promethazine (PHENERGAN) 12.5 MG tablet Take 12.5 mg by mouth every 6 (six) hours as needed. For nausea/vomiting.   Yes Historical Provider, MD  glucose blood test strip 1 each by Other route as needed. Use as instructed     Historical Provider, MD  Insulin Pen Needle (AURORA PEN NEEDLES) 31G X 8 MM MISC by Does not apply route.      Historical Provider, MD  PRODIGY LANCETS 28G MISC by Does not apply route. To inject Lantus as directed     Historical Provider, MD    Social History:    reports that she quit smoking about 14 years ago. Her smoking use included Cigarettes. She does not have any smokeless tobacco history on file. She reports that she does not drink alcohol or use illicit drugs.   Family History:    Family History  Problem Relation Age of Onset  . Diabetes Father     Mental Status Examination/Evaluation: Objective:  Appearance: Fairly Groomed  Psychomotor Activity:  Decreased  Eye Contact::  Poor  Speech:  Slow  Volume:  Decreased  Mood:  "I am okay"  Affect:  Congruent  Thought Process:  Logical and goal directed  Orientation:  Full  Thought Content:  Not delusional, not hallucinating  Suicidal Thoughts:  No  Homicidal Thoughts:  No  Judgement:  Intact  Insight:  Good    Assessment:   AXIS I Marijuana and Cocaine Dependence, Depressive Disorder NOS  AXIS II  Deffered  AXIS III See medical notes.  AXIS IV Substance abuse, poor coping skills  AXIS V 51-60 moderate symptoms     Recommendations: 1. Patient at this time does not want any help regarding outpatient or inpatient SA rehab. 2. Patient at this time is guarded in providing the history, but does not seem to be at acute risk to meet the criteria to make her IVC. 3. She does not want to be on any  antidepressants. 4. Based upon her history of  multiple admissions for abdominal pain and substance abuse, she will benefit from counseling in the outpatient setting and CSW can help facilitate.  Will follow up as needed. 5. CSW will need to call the family to get more collateral information     Eulogio Ditch, MD 11/6/201211:40 AM

## 2011-03-08 LAB — CBC
Hemoglobin: 8.2 g/dL — ABNORMAL LOW (ref 12.0–15.0)
MCH: 18.5 pg — ABNORMAL LOW (ref 26.0–34.0)
MCHC: 31.1 g/dL (ref 30.0–36.0)
Platelets: 437 10*3/uL — ABNORMAL HIGH (ref 150–400)

## 2011-03-08 LAB — BASIC METABOLIC PANEL
Calcium: 9.2 mg/dL (ref 8.4–10.5)
GFR calc Af Amer: 89 mL/min — ABNORMAL LOW (ref >90)
GFR calc non Af Amer: 77 mL/min — ABNORMAL LOW (ref >90)
Potassium: 3.4 mEq/L — ABNORMAL LOW (ref 3.5–5.1)
Sodium: 136 mEq/L (ref 135–145)

## 2011-03-08 LAB — RAPID URINE DRUG SCREEN, HOSP PERFORMED
Amphetamines: NOT DETECTED
Barbiturates: NOT DETECTED
Opiates: NOT DETECTED
Tetrahydrocannabinol: POSITIVE — AB

## 2011-03-08 LAB — HEPATITIS B E ANTIGEN: Hep B E Ag: NEGATIVE

## 2011-03-08 LAB — GLUCOSE, CAPILLARY: Glucose-Capillary: 136 mg/dL — ABNORMAL HIGH (ref 70–99)

## 2011-03-08 MED ORDER — POTASSIUM CHLORIDE CRYS ER 20 MEQ PO TBCR
40.0000 meq | EXTENDED_RELEASE_TABLET | Freq: Once | ORAL | Status: AC
  Administered 2011-03-08: 40 meq via ORAL
  Filled 2011-03-08: qty 2

## 2011-03-08 MED ORDER — INSULIN GLARGINE 100 UNIT/ML ~~LOC~~ SOLN: SUBCUTANEOUS | Status: DC

## 2011-03-08 MED ORDER — LABETALOL HCL 100 MG PO TABS: 50.0000 mg | ORAL_TABLET | Freq: Two times a day (BID) | ORAL | Status: DC

## 2011-03-08 MED ORDER — HYDROCHLOROTHIAZIDE 25 MG PO TABS: 25.0000 mg | ORAL_TABLET | Freq: Every day | ORAL | Status: DC

## 2011-03-08 MED ORDER — SODIUM CHLORIDE 0.9 % IV BOLUS (SEPSIS)
500.0000 mL | Freq: Once | INTRAVENOUS | Status: AC
  Administered 2011-03-08: 1000 mL via INTRAVENOUS

## 2011-03-08 MED ORDER — METRONIDAZOLE 500 MG PO TABS: 500.0000 mg | ORAL_TABLET | Freq: Two times a day (BID) | ORAL | Status: DC

## 2011-03-08 MED ORDER — HEPATITIS A VACCINE 1440 EL U/ML IM SUSP
1.0000 mL | Freq: Once | INTRAMUSCULAR | Status: AC
  Administered 2011-03-08: 1440 [IU] via INTRAMUSCULAR
  Filled 2011-03-08: qty 1

## 2011-03-08 MED ORDER — INSULIN GLARGINE 100 UNIT/ML ~~LOC~~ SOLN
45.0000 [IU] | Freq: Every day | SUBCUTANEOUS | Status: DC
  Administered 2011-03-08 – 2011-03-09 (×2): 45 [IU] via SUBCUTANEOUS

## 2011-03-08 NOTE — Progress Notes (Signed)
md notified of pt's orthostatic vital signs Discharge and hctz cancelled

## 2011-03-08 NOTE — Progress Notes (Signed)
     [facility] Daily Rounding Note 03/08/2011, 9:57 AM  SUBJECTIVE: Note no longer has narcotics on med list.  Eating solids, multiple trays of food yesterday. Still has belly pain and nausea.  2 BM yest.  Her two kids are in the room, looks like they spent the noght here. OBJECTIVE: General: Comfortable.  Affect better, direct eye contact  Vital signs in last 24 hours: Temp:  [97.4 F (36.3 C)-98.5 F (36.9 C)] 97.4 F (36.3 C) (11/14 0500) Pulse Rate:  [88-104] 88  (11/14 0935) Resp:  [16-20] 20  (11/14 0500) BP: (96-119)/(61-80) 113/76 mmHg (11/14 0935) SpO2:  [97 %-98 %] 98 % (11/14 0500) Weight:  [104.7 kg (230 lb 13.2 oz)] 230 lb 13.2 oz (104.7 kg) (11/14 0500) Last BM Date: 03/06/11  Heart: RRR Chest: Clear Abdomen: Soft, obese, NT  Extremities: no edema Neuro/Psych:  Less depressed  Intake/Output from previous day: 11/13 0701 - 11/14 0700 In: 960 [P.O.:960] Out: -   Intake/Output this shift: Total I/O In: 480 [P.O.:480] Out: -   Lab Results:  [facility] 03/08/11 0645  WBC 11.2*  HGB 8.2*  HCT 26.4*  PLT 437*   BMET  Basename 03/08/11 0645 03/07/11 0520 03/06/11 0845  NA 136 134* 136  K 3.4* 4.0 3.2*  CL 103 101 102  CO2 26 23 24   GLUCOSE 259* 133* 207*  BUN 13 10 6   CREATININE 0.94 0.77 0.60  CALCIUM 9.2 9.2 9.0    ASSESMENT: 1.  Chronic abd pain, unexplained.  Strong psych overlay.  Has had gastritis on egd in 02/2010.  On protonix.  2. Chronic nausea:  Normal gastric emptying study Oct 2011 though suspect her chronic narcotics caused slow emptying.   3. Depression 4. Poorly controlled DM 5. Hx CAD, balloon angioplasty in 05/2006 but did not have a stent from what I can find yet she remains on Plavix 6. Possible GB polyp by [place], would not explain her sxs. LFTs normal   PLAN: 1. No plans for any endoscopic studies, will sign off.  Continue daily PPI, prn antinauseals.  Start antidepressant?   LOS: 5 days   Jennye Moccasin  03/08/2011, 9:57  AM   Addendum:  Add chronic hepatis B to Assessment list.

## 2011-03-08 NOTE — Progress Notes (Signed)
Subjective: Patient is upset regarding her pain management, and does not understand why she cannot have tramadol. Reported 7/10 abdominal pain this morning and some nausea but no vomiting. Reports minimal improved appetite, but per nursing ate several trays of food yesterday. Patient became frustrated when the topic of her drug use came up and once again denied using cocaine.  She also reports some dizziness today.  Denies black tarry or red stools.    Objective: Vital signs in last 24 hours: Filed Vitals:   03/08/11 1230 03/08/11 1231 03/08/11 1234 03/08/11 1330  BP: 104/71 99/66 101/72 108/71  Pulse: 116 132 132 118  Temp:    98.3 F (36.8 C)  TempSrc:      Resp:    18  Height:      Weight:      SpO2:    99%   Weight change:   Intake/Output Summary (Last 24 hours) at 03/08/11 1524 Last data filed at 03/08/11 0900  Gross per 24 hour  Intake    480 ml  Output      0 ml  Net    480 ml   Physical Exam: General: Awake, sitting in bed, no acute distress. Appears unhappy.  CV: Tachycardic with regular rhythm, no murmurs  Resp: CTAB, normal work of breathing  Abd: Normoactive BS. Soft, nontender, nondistended, obese.  Ext: Warm and well perfused, no pretibial edema  Neuro: Appropriate to conversation. CNs grossly intact.  Lab Results: Basic Metabolic Panel:  Lab 03/08/11 1610 03/07/11 0520 03/06/11 0845  NA 136 134* --  K 3.4* 4.0 --  CL 103 101 --  CO2 26 23 --  GLUCOSE 259* 133* --  BUN 13 10 --  CREATININE 0.94 0.77 --  CALCIUM 9.2 9.2 --  MG -- -- 1.7  PHOS -- -- --   Liver Function Tests:  Lab 03/03/11 2052  AST 15  ALT 6  ALKPHOS 87  BILITOT 0.4  PROT 9.2*  ALBUMIN 3.9    Lab 03/03/11 2052  LIPASE 22  AMYLASE --   CBC:  Lab 03/08/11 0645 03/05/11 0656 03/03/11 2052  WBC 11.2* 13.5* --  NEUTROABS -- -- 16.8*  HGB 8.2* 10.2* --  HCT 26.4* 32.3* --  MCV 59.5* 59.2* --  PLT 437* 380 --   CBG:  Lab 03/08/11 1145 03/08/11 0750 03/07/11 2052  03/07/11 1640 03/07/11 1129 03/07/11 0831  GLUCAP 159* 265* 249* 418* 332* 238*   Hemoglobin A1C:  Lab 03/04/11 0907  HGBA1C 11.0*   Fasting Lipid Panel:  Lab 03/04/11 0857  CHOL 204*  HDL 50  LDLCALC 126*  TRIG 140  CHOLHDL 4.1  LDLDIRECT --   Anemia Panel:  Lab 03/04/11 0907  VITAMINB12 448  FOLATE 12.9  FERRITIN 11  TIBC 428  IRON 23*  RETICCTPCT 1.5   Urine Drug Screen: Results for Shirley [name], Shirley [name] (MRN [id number]) as of 03/08/2011 15:27  Ref. Range 03/08/2011 13:32  Amphetamines Latest Range: NONE DETECTED  NONE DETECTED  Barbiturates Latest Range: NONE DETECTED  NONE DETECTED  Benzodiazepines Latest Range: NONE DETECTED  NONE DETECTED  Opiates Latest Range: NONE DETECTED  NONE DETECTED  COCAINE Latest Range: NONE DETECTED  NONE DETECTED  Tetrahydrocannabinol Latest Range: NONE DETECTED  POSITIVE (A)   Micro Results: Recent Results (from the past 240 hour(s))  WET PREP, GENITAL     Status: Abnormal   Collection Time   03/04/11  6:42 AM      Component Value Range Status Comment  Yeast, Wet Prep NONE SEEN  NONE SEEN  Final    Trich, Wet Prep NONE SEEN  NONE SEEN  Final    Clue Cells, Wet Prep MANY (*) NONE SEEN  Final    WBC, Wet Prep HPF POC FEW (*) NONE SEEN  Final   CULTURE, BLOOD (ROUTINE X 2)     Status: Normal (Preliminary result)   Collection Time   03/04/11  9:43 AM      Component Value Range Status Comment   Specimen Description BLOOD LEFT ARM   Final    Special Requests     Final    Value: BOTTLES DRAWN AEROBIC AND ANAEROBIC 10CC BLUE,5CC RED   Setup Time [id number]   Final    Culture     Final    Value:        BLOOD CULTURE RECEIVED NO GROWTH TO DATE CULTURE WILL BE HELD FOR 5 DAYS BEFORE ISSUING A FINAL NEGATIVE REPORT   Report Status PENDING   Incomplete   CULTURE, BLOOD (ROUTINE X 2)     Status: Normal (Preliminary result)   Collection Time   03/04/11  9:50 AM      Component Value Range Status Comment   Specimen Description BLOOD LEFT  HAND   Final    Special Requests     Final    Value: BOTTLES DRAWN AEROBIC AND ANAEROBIC 10CC BLUE,5CC RED   Setup Time [id number]   Final    Culture     Final    Value:        BLOOD CULTURE RECEIVED NO GROWTH TO DATE CULTURE WILL BE HELD FOR 5 DAYS BEFORE ISSUING A FINAL NEGATIVE REPORT   Report Status PENDING   Incomplete    Medications: I have reviewed the patient's current medications.  Scheduled Meds:   . aspirin  325 mg Oral Daily  . clopidogrel  75 mg Oral Q breakfast  . docusate sodium  100 mg Oral BID  . enoxaparin  40 mg Subcutaneous Daily  . feeding supplement  1 Container Oral BID  . ferrous gluconate  325 mg Oral BID WC  . Fluticasone-Salmeterol  1 puff Inhalation BID  . hepatitis A virus (PF) vaccine  1 mL Intramuscular Once  . insulin aspart  0-20 Units Subcutaneous TID WC  . insulin aspart  6 Units Subcutaneous TID WC  . insulin glargine  45 Units Subcutaneous QHS  . labetalol  50 mg Oral BID  . metroNIDAZOLE  500 mg Oral Q12H  . ondansetron (ZOFRAN) IV  4 mg Intravenous Once  . pantoprazole  40 mg Oral Q1200  . potassium chloride  40 mEq Oral Once  . senna  1 tablet Oral Daily  . DISCONTD: hydrochlorothiazide  25 mg Oral Daily  . DISCONTD: insulin aspart  0-5 Units Subcutaneous QHS  . DISCONTD: insulin aspart  0-9 Units Subcutaneous TID WC  . DISCONTD: insulin aspart  3 Units Subcutaneous TID WC  . DISCONTD: insulin glargine  35 Units Subcutaneous QHS  . DISCONTD: insulin glargine  40 Units Subcutaneous QHS   Continuous Infusions:  PRN Meds:.acetaminophen, clonazePAM, ondansetron (ZOFRAN) IV, promethazine  Assessment/Plan: Pt is a 35yo woman with multiple comorbidities including fibromyalgia, depression, and treatment with H. pylori, presents with LLQ abdominal pain, nausea, and vomiting. Pt also reports vaginal itching and white discharge.   #Dizziness: Patient reports new onset dizziness today.  Initial orthostatic vital signs were positive by HR, which  was surprising in the setting of beta blockade.  Repeat orthostatic vital signs were positive by BP.   She also had what seems like an acute drop in Hb.  She denies blood in urine or stool.  No hemoptysis/hemetemesis.  She does have chronic iron deficiency anemia. Plan:We will discontinue HCTZ today, give a 500cc bolus and monitor, will check FOBT, repeat CBC with AML  #Tachycardia: Patient's heart rate has been mildly tachycardic throughout hospitalization, but was in the 120s earlier today.  Likely 2/2 volume depletion from HCTZ.  She did have visitors, so repeat UDS was obtained to ensure that tachycardia was not 2/2 cocaine.  She is written for Tylenol 650mg  q6h, but seems to be getting it every 12 hours, so  I do not know if sinus tachycardia is due to pain.  Past d/c summaries suggest baseline HR is high 80s-low 90s.  Will monitor and treat at above.   # Abdominal pain- Etiology remains unclear, but most likely cocaine induced vasoconstriction.  Cocaine would also explain N/V and gastroparesis.  Since she is able to eat multiple trays of food without vomiting. She has had multiple GI workups in the past for similar symptoms with no underlying cause discerned. Pelvic US as well as the CT abdomen and pelvis showed only a left luteal cyst but otherwise no acute findings. Cyst unlikely to cause this degree of pain. It is unclear if patient had been taking opiates outpatient, but constipation may have been contributing as well, though patient has had BM over the last three days. Given history of polysubstance abuse and UDS positive for cocaine & opiates, patient may be undergoing opiate withdrawal. Workup for Celiac's disease was negative. Urine pregnancy test (quantitative & qualitative) were negative, thus ectopic pregnancy is not in the differential. PID is unlikely as GC/Chlamydia test was negative and patient did not experience cervical motion tenderness on physical exam.  -Pain control with tylenol    -N/V control with prn phenergan and zofran  -Will discharge with a pain plan that works for the patient, and she will follow up in clinic.  She is not a candidate for NSAIDs given gastritis nor opioids given h/o gastroparesis.   #Vaginal Discharge: micro revealed clue cells, therefore treating for bacterial vaginosis  -Metronidazole 500mg  PO BID   # HTN- Elevated at admission. Normotensive this AM, but later found to be orthostatic.   -Metoprolol was discontinued 2 days ago given h/o cocaine abuse.  Labetalol added given property of alpha and beta blockade. HCTZ was added, but discontinued today given orthostatic vitals. Will monitor.   # History of Hep B- Stable. Pt may not be candidate for tx given history of noncompliance with medications and polysubstance abuse. Hep A antibody test was negative.  -Follow up Hep B surface was positive, & Hep e antigen pending  -Hep A vaccination today  # Hypokalemia: Repleted, but potassium dropped to 3.4 this morning. Mg wnl. Unclear etiology.  -40 mEq potassium today   #. DM- Possible DKA resolved. HbA1c is 11. Patient notes compliance with home dose insulin. She reports she is not taking metformin 2/2 nausea side effects. Blood glucose levels spiked to 418 yesterday afternoon, most likely in response to inadequate insulin and high carbohydrate diet.  -Increase lantus to 45 units qHS   #Questionable AG metabolic acidosis resolved.   #Polysubstance abuse: UDS positve for cocaine.  -Appreciate social work seeing the patient, although she is not amenable to rehab   #. History of CAD- Stable on admission. No CP  -Continue home ASA and  Plavix  -Lipid panel suggests HLD, will start statin when pain symptoms resolve with goal LDL <100   # Depression/Anxiety- No SI/HI, but seems depressed. There may a somatization component to her abdominal pain and N/V.  -Continue home Klonopin  -Appreciated psychiatry consult, although patient is reluctant to seek help  with depression or substance abuse   # Anemia- Baseline Hgb 9-11, seems to have dropped. Chronic anemia likely due to iron deficiency complicated by heavy menstrual bleeding and noncompliance with home iron supplements. Chart review suggests Hemoglobin C, and electrophoresis in 2009, but no documentation of electrophoresis itself. Per Hematology notes from 02/2010 unlikely since patient would have to be homozygous  -Restart iron supplements, FOBT x 3 -Daily CBC   # Leukocytosis- Seems to be improving. WBC 18.7 on admission. Patient has been treated with flagyl for BV. Will continue to monitor. May be related to chronic hepatitis. In Jan 2012, Dr. Darrold Span was consulted and was not convinced of CML at that time. ESR elevated with 36 but it has been ranging between 45-48 as well CRP elevated with 0.63 again has been elevated between 0.4-2.7 which most consistent with an inflammatory process. LAP was WNL.  -Daily CBC   # History of thrombocytosis- Likely secondary to chronic inflammation and iron deficiency anemia. Will continue to monitor.  -Daily CBC     LOS: 5 days   KAPADIA, Shirley [name] 03/08/2011, 3:24 PM

## 2011-03-08 NOTE — Progress Notes (Signed)
Agree with Ms. Heron Nay assessment and plan.

## 2011-03-08 NOTE — Progress Notes (Signed)
Clinical Social Work:  Engineer, technical sales  CSW met with patient at bedside.  CSW explained role and reason for visit and patient politely denied any needs or resources for substance abuse. Patient plans to return home with her children, was offered outpatient meetings and resources, but she denies.  No other needs addressed or concerns, thus CSW will sign off at this time.  Ashley Jacobs, MSW LCSWA [phone]

## 2011-03-08 NOTE — Progress Notes (Signed)
  Internal Medicine Teaching Service Attending Note Date: 03/08/2011  Patient name: Shirley [name]  Medical record number: [id number]  Date of birth: 10-27-1975    This patient has been seen and discussed with the house staff. Please see their note for complete details. I concur with their findings with the following additions/corrections: Spoke with nursing aide- she ate multiple trays of food yesterday. Her exam is benign.  Will discuss with team regarding her d/c plans, including DM control. The etiology of her chronic pain remains elusive. My appreciation to psychiatry for their eval for her polysubstance abuse.  Johny Sax 03/08/2011, 8:53 AM

## 2011-03-09 ENCOUNTER — Other Ambulatory Visit: Payer: Self-pay

## 2011-03-09 LAB — GLUCOSE, CAPILLARY: Glucose-Capillary: 197 mg/dL — ABNORMAL HIGH (ref 70–99)

## 2011-03-09 LAB — BASIC METABOLIC PANEL
Calcium: 9.2 mg/dL (ref 8.4–10.5)
GFR calc Af Amer: >90 mL/min (ref >90)
GFR calc non Af Amer: >90 mL/min (ref >90)
Glucose, Bld: 215 mg/dL — ABNORMAL HIGH (ref 70–99)
Sodium: 137 mEq/L (ref 135–145)

## 2011-03-09 LAB — CARDIAC PANEL(CRET KIN+CKTOT+MB+TROPI)
CK, MB: 1.5 ng/mL (ref 0.3–4.0)
Total CK: 42 U/L (ref 7–177)

## 2011-03-09 LAB — CBC
MCH: 18.8 pg — ABNORMAL LOW (ref 26.0–34.0)
MCHC: 31.4 g/dL (ref 30.0–36.0)
Platelets: 439 10*3/uL — ABNORMAL HIGH (ref 150–400)
RBC: 4.42 MIL/uL (ref 3.87–5.11)

## 2011-03-09 LAB — CLOSTRIDIUM DIFFICILE BY PCR: Toxigenic C. Difficile by PCR: NEGATIVE

## 2011-03-09 MED ORDER — PANTOPRAZOLE SODIUM 40 MG PO TBEC
40.0000 mg | DELAYED_RELEASE_TABLET | Freq: Every day | ORAL | Status: DC
  Administered 2011-03-09: 40 mg via ORAL
  Filled 2011-03-09: qty 1

## 2011-03-09 MED ORDER — SODIUM CHLORIDE 0.9 % IV SOLN
INTRAVENOUS | Status: DC
  Administered 2011-03-10: via INTRAVENOUS

## 2011-03-09 MED ORDER — SODIUM CHLORIDE 0.9 % IV BOLUS (SEPSIS)
500.0000 mL | Freq: Once | INTRAVENOUS | Status: AC
  Administered 2011-03-09: 500 mL via INTRAVENOUS

## 2011-03-09 MED ORDER — INSULIN ASPART 100 UNIT/ML ~~LOC~~ SOLN: 30.0000 [IU] | Freq: Once | SUBCUTANEOUS | Status: DC

## 2011-03-09 NOTE — Progress Notes (Signed)
Subjective: Patient reports having a rough night, doubled over and having diarrhea. Describes 1st BM as loose, but 2nd was more formed.  No CP, but persistent tachycardia and dizziness.  Ate small amount of chicken salad overnight.  Abdominal pain unchanged, but patient more talkative today. Endorses heart palpitations that feel similar to when she had a MI.  Today patient reports that she did not take cocaine, but has been around her mother, who does do cocaine.    Objective: Vital signs in last 24 hours: Filed Vitals:   03/09/11 1102 03/09/11 1104 03/09/11 1107 03/09/11 1349  BP: 137/81 118/80 114/80 111/74  Pulse: 106 125 130 94  Temp:    98.2 F (36.8 C)  TempSrc:      Resp:    16  Height:      Weight:      SpO2:    100%   Weight change:   Intake/Output Summary (Last 24 hours) at 03/09/11 1536 Last data filed at 03/09/11 0900  Gross per 24 hour  Intake    360 ml  Output      0 ml  Net    360 ml   Physical Exam: General: Awake, sitting in bed, no acute distress CV: Tachycardic with regular rhythm, no murmurs  Resp: CTAB, normal work of breathing  Abd: Normoactive BS. Soft, nontender, nondistended, obese.  Ext: Warm and well perfused, no pretibial edema  Neuro: Appropriate to conversation. CNs grossly intact.  Lab Results: Basic Metabolic Panel:  Lab 03/09/11 1610 03/08/11 0645 03/06/11 0845  NA 137 136 --  K 3.5 3.4* --  CL 102 103 --  CO2 26 26 --  GLUCOSE 215* 259* --  BUN 11 13 --  CREATININE 0.64 0.94 --  CALCIUM 9.2 9.2 --  MG -- -- 1.7  PHOS -- -- --   Liver Function Tests:  Lab 03/03/11 2052  AST 15  ALT 6  ALKPHOS 87  BILITOT 0.4  PROT 9.2*  ALBUMIN 3.9    Lab 03/03/11 2052  LIPASE 22  AMYLASE --   CBC:  Lab 03/09/11 0625 03/08/11 0645 03/03/11 2052  WBC 12.5* 11.2* --  NEUTROABS -- -- 16.8*  HGB 8.3* 8.2* --  HCT 26.4* 26.4* --  MCV 59.7* 59.5* --  PLT 439* 437* --   CBG:  Lab 03/09/11 1215 03/09/11 0804 03/08/11 2128 03/08/11  1632 03/08/11 1145 03/08/11 0750  GLUCAP 197* 213* 136* 261* 159* 265*   Hemoglobin A1C:  Lab 03/04/11 0907  HGBA1C 11.0*   Fasting Lipid Panel:  Lab 03/04/11 0857  CHOL 204*  HDL 50  LDLCALC 126*  TRIG 140  CHOLHDL 4.1  LDLDIRECT --   Anemia Panel:  Lab 03/04/11 0907  VITAMINB12 448  FOLATE 12.9  FERRITIN 11  TIBC 428  IRON 23*  RETICCTPCT 1.5   Micro Results: Recent Results (from the past 240 hour(s))  WET PREP, GENITAL     Status: Abnormal   Collection Time   03/04/11  6:42 AM      Component Value Range Status Comment   Yeast, Wet Prep NONE SEEN  NONE SEEN  Final    Trich, Wet Prep NONE SEEN  NONE SEEN  Final    Clue Cells, Wet Prep MANY (*) NONE SEEN  Final    WBC, Wet Prep HPF POC FEW (*) NONE SEEN  Final   CULTURE, BLOOD (ROUTINE X 2)     Status: Normal (Preliminary result)   Collection Time   03/04/11  9:43 AM      Component Value Range Status Comment   Specimen Description BLOOD LEFT ARM   Final    Special Requests     Final    Value: BOTTLES DRAWN AEROBIC AND ANAEROBIC 10CC BLUE,5CC RED   Setup Time [id number]   Final    Culture     Final    Value:        BLOOD CULTURE RECEIVED NO GROWTH TO DATE CULTURE WILL BE HELD FOR 5 DAYS BEFORE ISSUING A FINAL NEGATIVE REPORT   Report Status PENDING   Incomplete   CULTURE, BLOOD (ROUTINE X 2)     Status: Normal (Preliminary result)   Collection Time   03/04/11  9:50 AM      Component Value Range Status Comment   Specimen Description BLOOD LEFT HAND   Final    Special Requests     Final    Value: BOTTLES DRAWN AEROBIC AND ANAEROBIC 10CC BLUE,5CC RED   Setup Time [id number]   Final    Culture     Final    Value:        BLOOD CULTURE RECEIVED NO GROWTH TO DATE CULTURE WILL BE HELD FOR 5 DAYS BEFORE ISSUING A FINAL NEGATIVE REPORT   Report Status PENDING   Incomplete   CLOSTRIDIUM DIFFICILE BY PCR     Status: Normal   Collection Time   03/09/11 11:21 AM      Component Value Range Status Comment   C  difficile by pcr NEGATIVE  NEGATIVE  Final    Medications: I have reviewed the patient's current medications. Scheduled Meds:   . aspirin  325 mg Oral Daily  . clopidogrel  75 mg Oral Q breakfast  . docusate sodium  100 mg Oral BID  . enoxaparin  40 mg Subcutaneous Daily  . feeding supplement  1 Container Oral BID  . ferrous gluconate  325 mg Oral BID WC  . Fluticasone-Salmeterol  1 puff Inhalation BID  . insulin aspart  0-20 Units Subcutaneous TID WC  . insulin aspart  6 Units Subcutaneous TID WC  . insulin glargine  45 Units Subcutaneous QHS  . metroNIDAZOLE  500 mg Oral Q12H  . ondansetron (ZOFRAN) IV  4 mg Intravenous Once  . pantoprazole  40 mg Oral Q1200  . senna  1 tablet Oral Daily  . sodium chloride  500 mL Intravenous Once  . sodium chloride  500 mL Intravenous Once  . DISCONTD: labetalol  50 mg Oral BID  . DISCONTD: pantoprazole  40 mg Oral Q1200   Continuous Infusions:   . sodium chloride 125 mL/hr at 03/09/11 1236   PRN Meds:.acetaminophen, clonazePAM, ondansetron (ZOFRAN) IV, promethazine  Assessment/Plan: Pt is a 35yo woman with multiple comorbidities including fibromyalgia, depression, and treatment with H. pylori, presents with LLQ abdominal pain, nausea, and vomiting. Pt also reports vaginal itching and white discharge.   #Dizziness: Patient reports improved from yesterday.  Orthostatics were negative by blood pressure after IVF, but heart rate did increase with standing.  She also had what seems like an acute drop in Hb yesterday, stable today. She denies blood in urine or stool. No hemoptysis/hemetemesis. She does have chronic iron deficiency anemia.  Plan:500 cc bolus, then 125cc/h  #Tachycardia: Patient's heart rate has been mildly tachycardic throughout hospitalization, and went up to 140 today. Thought initially to be 2/2 to volume depletion given recent addition of HCTZ.  Repeat UDS was negative for cocaine.  Patient persistently tachy after  IVF.  Given  description and history of uncontrolled DM, of concern is acute MI. She is written for Tylenol 650mg  q6h, but is not asking for medication, so I do not know if sinus tachycardia is due to pain. Past d/c summaries suggest baseline HR is high 80s-low 90s.  Plan: CE x 1, stat ekg  # Anemia- Baseline Hgb 9-11, seems to have dropped. First FOBT negative.  Acute drop may be due to HCTZ induced hemolysis.  Pt has been on HCTZ in the past, but unclear why it was discontinued.  Chronic anemia likely due to iron deficiency complicated by heavy menstrual bleeding and noncompliance with home iron supplements. Chart review suggests Hemoglobin C, and electrophoresis in 2009, but no documentation of electrophoresis itself. Per Hematology notes from 02/2010 unlikely since patient would have to be homozygous  -Restart iron, supplements, FOBT x 2, blood smear for shistocytes  -Daily CBC   # Abdominal pain- Improving.  Etiology remains unclear, but most likely cocaine induced vasoconstriction. Cocaine would also explain N/V and gastroparesis. Also, ovarian cyst seen on CT may have ruptured and is resolving.  She is able to eat multiple trays of food without vomiting at this point. She has had multiple GI workups in the past for similar symptoms with no underlying cause discerned. Pelvic US as well as the CT abdomen and pelvis showed only a left luteal cyst but otherwise no acute findings.  Workup for Celiac's disease was negative.  -Pain control with tylenol  -N/V control with prn phenergan and zofran  -Will discharge with a pain plan that works for the patient, and she will follow up in clinic. She is not a candidate for NSAIDs given gastritis nor opioids given h/o gastroparesis.   #Vaginal Discharge: micro revealed clue cells, therefore treating for bacterial vaginosis  -Metronidazole 500mg  PO BID   # HTN- Elevated at admission. Normotensive today.  -Metoprolol was discontinued 2 days ago given h/o cocaine abuse.   HCTZ & labetalol were added, but discontinued given orthostatic vitals. Will monitor.   # History of Hep B- Stable. Pt not be candidate for tx given history of noncompliance with medications and polysubstance abuse. Hep A antibody test was negative. Hep A vaccine yesterday. -Follow up Hep B surface was positive, & Hep e antigen pending   # Hypokalemia: Repleted. Mg wnl. Likely 2/2 to HCTZ, which has since been d/c'd.   #. DM- Possible DKA resolved. HbA1c is 11. Patient notes compliance with home dose insulin. She reports she is not taking metformin 2/2 nausea side effects. Blood glucose levels as high as 261 in last 24 h, but improved from spike of 418 yesterday afternoon.  Patient is not compliant with a diabetic diet.  She had [facility]'s brought from outside.   #Questionable AG metabolic acidosis resolved.   #Polysubstance abuse: UDS positve for cocaine.  -Appreciate social work seeing the patient, although she is not amenable to rehab   #. History of CAD- Stable on admission. No CP  -Continue home ASA and Plavix  -Lipid panel suggests HLD, will start statin when pain symptoms resolve with goal LDL <100   # Depression/Anxiety- No SI/HI, but seems depressed. There may a somatization component to her abdominal pain and N/V.  -Continue home Klonopin  -Appreciated psychiatry consult, although patient is reluctant to seek help with depression or substance abuse   # Leukocytosis- Seems to be improving. WBC 18.7 on admission. Patient has been treated with flagyl for BV. Will continue to monitor. May  be related to chronic hepatitis. In Jan 2012, Dr. Darrold Span was consulted and was not convinced of CML at that time. ESR elevated with 36 but it has been ranging between 45-48 as well CRP elevated with 0.63 again has been elevated between 0.4-2.7 which most consistent with an inflammatory process. LAP was WNL.  -Daily CBC   # History of thrombocytosis- Likely secondary to chronic inflammation and iron  deficiency anemia. Will continue to monitor.  -Daily CBC     LOS: 6 days   KAPADIA, Dahl Higinbotham 03/09/2011, 3:36 PM

## 2011-03-09 NOTE — Progress Notes (Signed)
  Internal Medicine Teaching Service Attending Note Date: 03/09/2011  Patient name: Shirley [name]  Medical record number: [id number]  Date of birth: April 12, 1976    This patient has been seen and discussed with the house staff. Please see their note for complete details. I concur with their findings with the following additions/corrections: She is having difficulty over last 24h with tachycardia. Will transition her off her blood pressure medications, hydrate her aggressively. Her pain does not seem to have increased. Very difficult story regarding her cocaine exposure in her house, she continues to deny personal use.  Johny Sax 03/09/2011, 1:33 PM

## 2011-03-10 LAB — CBC
HCT: 26.3 % — ABNORMAL LOW (ref 36.0–46.0)
MCHC: 30.8 g/dL (ref 30.0–36.0)
MCV: 60.6 fL — ABNORMAL LOW (ref 78.0–100.0)
Platelets: 419 10*3/uL — ABNORMAL HIGH (ref 150–400)
RDW: 20.4 % — ABNORMAL HIGH (ref 11.5–15.5)
WBC: 13.1 10*3/uL — ABNORMAL HIGH (ref 4.0–10.5)

## 2011-03-10 LAB — CULTURE, BLOOD (ROUTINE X 2)
Culture  Setup Time: 201211101723
Culture  Setup Time: 201211101723
Culture: NO GROWTH

## 2011-03-10 MED ORDER — METRONIDAZOLE 500 MG PO TABS: 500.0000 mg | ORAL_TABLET | Freq: Two times a day (BID) | ORAL | Status: AC

## 2011-03-10 MED ORDER — ACETAMINOPHEN 325 MG PO TABS: 650.0000 mg | ORAL_TABLET | Freq: Four times a day (QID) | ORAL | Status: AC | PRN

## 2011-03-10 MED ORDER — INSULIN GLARGINE 100 UNIT/ML ~~LOC~~ SOLN: SUBCUTANEOUS | Status: DC

## 2011-03-10 MED ORDER — SENNA 8.6 MG PO TABS: 1.0000 | ORAL_TABLET | Freq: Every day | ORAL | Status: DC

## 2011-03-10 MED ORDER — DSS 100 MG PO CAPS: 100.0000 mg | ORAL_CAPSULE | Freq: Two times a day (BID) | ORAL | Status: AC

## 2011-03-10 NOTE — Progress Notes (Signed)
  Internal Medicine Teaching Service Attending Note Date: 03/10/2011  Patient name: Shirley [name]  Medical record number: [id number]  Date of birth: 03-27-1976    This patient has been seen and discussed with the house staff. Please see their note for complete details. I concur with their findings with the following additions/corrections: Patient seen and d/w h/o. Plan for d/c, will ask her primary MD to f/u her BP, HR, need for non-specific beta blocker (secondary prophylaxis, cocaine use). Attribute her abd pain to dietary habits and DM gastroparesis.  Johny Sax 03/10/2011, 9:27 AM

## 2011-03-10 NOTE — Discharge Summary (Signed)
Internal Medicine Teaching [facility] Discharge Note  Name: Shirley [name] MRN: [id number] DOB: 01/17/1976 35 y.o.  Date of Admission: 03/03/2011  6:19 PM Date of Discharge: 03/10/2011 Attending Physician: Dr. Johny Sax  Discharge Diagnosis: #Abdominal Pain- multifactorial etiology #Uncontrolled IDDM (HbA1c =11, 03/04/11) #Iron deficiency anemia (Ferritin=11, 03/04/11) #Bacterial Vaginosis #Polysubstance abuse #Depression #Anxiety #Left corpus luteal cyst #h/o chronic Leukocytosis #h/o chronic Thrombocytosis #Chronic Hepatitis B #Hypertension #h/o CAD #Fibrocystic change of breast? - to be assessed outpatient   Discharge Medications: Discharge Medication List as of 03/10/2011 11:47 AM    START taking these medications   Details  docusate sodium 100 MG CAPS Take 100 mg by mouth 2 (two) times daily., Starting 03/10/2011, Until Mon 03/20/11, Print    senna (SENOKOT) 8.6 MG TABS Take 1 tablet (8.6 mg total) by mouth daily., Starting 03/10/2011, Until Discontinued, Print      CONTINUE these medications which have CHANGED   Details  acetaminophen (TYLENOL) 325 MG tablet Take 2 tablets (650 mg total) by mouth every 6 (six) hours as needed., Starting 03/10/2011, Until Mon 03/20/11, No Print    insulin glargine (LANTUS SOLOSTAR) 100 UNIT/ML injection Inject 50 Units into the skin at bedtime, Print    metroNIDAZOLE (FLAGYL) 500 MG tablet Take 1 tablet (500 mg total) by mouth every 12 (twelve) hours., Starting 03/04/2011, Until Sat 03/11/11, Print      CONTINUE these medications which have NOT CHANGED   Details  ADVAIR DISKUS 100-50 MCG/DOSE AEPB INHALE ONE PUFF TWO TIMES A DAY, Normal    aspirin 325 MG tablet Take 325 mg by mouth daily.  , Until Discontinued, Historical Med    clonazePAM (KLONOPIN) 0.5 MG tablet Take 0.5 mg by mouth 3 (three) times daily as needed. For anxiety., Until Discontinued, Historical Med    clopidogrel (PLAVIX) 75 MG tablet Take 75 mg by  mouth daily.  , Until Discontinued, Historical Med    ferrous gluconate (FERGON) 325 MG tablet Take 325 mg by mouth daily with breakfast.  , Until Discontinued, Historical Med    omeprazole (PRILOSEC) 40 MG capsule Take 40 mg by mouth daily.  , Until Discontinued, Historical Med    promethazine (PHENERGAN) 12.5 MG suppository Place 12.5 mg rectally every 6 (six) hours as needed. For nausea. , Until Discontinued, Historical Med    promethazine (PHENERGAN) 12.5 MG tablet Take 12.5 mg by mouth every 6 (six) hours as needed. For nausea/vomiting., Until Discontinued, Historical Med    glucose blood test strip 1 each by Other route as needed. Use as instructed , Historical Med    Insulin Pen Needle (AURORA PEN NEEDLES) 31G X 8 MM MISC by Does not apply route.  , Until Discontinued, Historical Med    PRODIGY LANCETS 28G MISC by Does not apply route. To inject Lantus as directed , Until Discontinued, Historical Med      STOP taking these medications     metoprolol tartrate (LOPRESSOR) 25 MG tablet      oxyCODONE-acetaminophen (PERCOCET) 5-325 MG per tablet      Disposition and follow-up:   Shirley [name] was discharged from [facility] in Stable condition.  At follow up, PCP should consider starting labetalol (non-selective BB given h/o cocaine use), monitor CBC, discuss depression/patient's mood and consider SSRI intervention, consider increase frequency of iron to BID/TID (given patient's nausea, she was discharged with daily dose), consider adding statin to medications and monitor cystic mass on patient's right breast.  Patient has missed several appointments  in the past and had poor glucose control in the hospital (had Shoney's brought to hospital), so I imagine compliance to be a problem.  Follow-up Appointments:  Discharge Orders    Future Appointments: Provider: Department: Dept Phone: Center:   03/20/2011 2:00 PM Kristie Cowman, MD [facility] [phone]  [facility]     Future Orders Please Complete By Expires   Diet Carb Modified      Diet - low sodium heart healthy      Diet Carb Modified      Activity as tolerated - No restrictions      No wound care      Call MD for:  temperature >100.4      Discharge instructions      Comments:   Please be sure to call the IM outpatient clinic at [phone] should your symptoms worsen.  If you have to take medication for your pain, please avoid ibuprofen/alieve/motrin because of your gastritis and avoid tramadol/percocet/vicodin because of your gastroparesis.  Please be sure to follow up with Dr. Bosie Clos regarding your diabetes control.  Please try to maintain a diabetic diet.  Please be sure to bring a log of your blood sugars to your next appointment on March 20, 2011 at 2pm.   Activity as tolerated - No restrictions      No wound care      Call MD for:  temperature >100.4      Call MD for:  persistant nausea and vomiting      Call MD for:  severe uncontrolled pain      Call MD for:  difficulty breathing, headache or visual disturbances      Call MD for:  persistant dizziness or light-headedness      Call MD for:  extreme fatigue        Consultations:  GI Lianne Bushy, PA and Stan Head, MD), Psychiatry Eulogio Ditch, MD)  Procedures Performed:  -TRANSABDOMINAL & TRANSVAGINAL US OF PELVIS DOPPLER ULTRASOUND OF OVARIES 03/04/2011 Clinical Data:  Left pelvic pain, evaluate for ovarian cyst / torsion    Transabdominal technique was performed for global imaging of the pelvis including uterus, ovaries, adnexal regions, and pelvic cul-de-sac.  It was necessary to proceed with endovaginal exam following the transabdominal exam to visualize the endometrium.  Color and duplex Doppler ultrasound was utilized to evaluate blood flow to the ovaries.   Comparison:  CT abdomen/pelvis dated 08/28/2010.  Findings:  Uterus:  Retroverted and mildly heterogeneous.  Measures 8.2 x 4.3 x 5.0 cm.  Endometrium:   Normal in thickness and appearance, measuring 10 mm.  Right ovary: Poorly visualized.  Measures approximately 3.6 x 2.8 x 2.3 cm.  Left ovary:   Normal appearance/no adnexal mass, measuring 3.6 x 2.6 x 2.5 cm, notable for a corpus luteal cyst.  Additional comments:  Trace pelvic ascites.  Pulsed Doppler evaluation demonstrates normal low-resistance arterial and venous waveforms in both ovaries.   IMPRESSION: Normal exam.  No evidence of pelvic mass or other significant abnormality.  Left corpus luteal cyst.  No sonographic evidence for ovarian torsion.   -CT ABDOMEN AND PELVIS WITH CONTRAST 03/04/2011  Clinical Data: Left lower abdominal pain, hepatitis B, cocaine withdrawal     Comparison: 08/28/2010   Findings: Lung bases are clear.  Liver, spleen, pancreas, and adrenal glands within normal limits.  Gallbladder is unremarkable.  No intrahepatic or extrahepatic ductal dilatation.  Kidneys within normal limits.  No hydronephrosis.  No evidence of bowel obstruction.  Normal  appendix.  No colonic wall thickening or inflammatory changes.  No evidence of abdominal aortic aneurysm.  No abdominopelvic ascites.  No suspicious abdominopelvic lymphadenopathy.  Uterus and bilateral ovaries are unremarkable, noting a left corpus luteal cyst.  Bladder is within normal limits.  Mild degenerative changes of the visualized thoracolumbar spine.   IMPRESSION: No evidence of bowel obstruction.  Normal appendix.  Left corpus luteal cyst.  Otherwise, no CT findings to account for the patient's abdominal pain.    -ACUTE ABDOMEN SERIES (ABDOMEN 2 VIEW & CHEST 1 VIEW)  03/03/2011  Clinical Data: Cocaine withdrawal; diffuse chest and abdominal pain.   Comparison: Chest and abdominal radiographs performed 01/26/2011   Findings: The lungs are well-aerated and clear.  There is no evidence of focal opacification, pleural effusion or pneumothorax. The cardiomediastinal silhouette is within normal limits.  The visualized bowel gas  pattern is unremarkable.  Scattered stool and air are noted within the colon; there is no evidence of small bowel dilatation to suggest obstruction.  No free intra-abdominal air is identified on the provided upright view.  No acute osseous abnormalities are seen; the sacroiliac joints are unremarkable in appearance.   IMPRESSION:  1.  Unremarkable bowel gas pattern; no free intra-abdominal air seen. 2.  No acute cardiopulmonary process identified.     Admission HPI:  Ms. 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 The patient had cath in December 2010 demonstrated no restenosis or significant coronary artery disease progression.   In addition, she also has multiple admissions for abdominal pain and N/V since 02/2010 with negative work-up with the most recent admission in April 2012. She presents to the ED today for left lower abdominal pain, N/V x 2 days in duration with the last episode of Vomiting in the ED. Denies any bloody vomitous or fever or chills. She states that the pain is sharp, intermittent, 10/10 in severity, no radiation, exacerbation factor is N/V and is alleviated by Percocet and "laying on it, rocking". Food does not aggravate her pain nor make it better. Patient states that she saw Dr. Sharmon Leyden once as an outpatient but still does not know the reason why she continues to have abdominal pain, N/V. Per patient's report, she started having abdominal pain after she was diagnosed with H. Pylori in 02/2010 and was treated twice.   In addition, she endorses white vaginal discharge with some itching, increased in urinary frequency, but denies any burning sensation or dysuria. Her LMP was 02/08/2011 and reports normal cycle except she has heavy menstrual bleeding. She reports  having 2 sexual partners without using protection.   Admission Physical Exam:  Blood pressure 123/78, pulse 113, temperature 98.4 F (36.9 C), temperature source Oral, resp. rate 18, SpO2 99.00%.  General: alert, well-developed, and cooperative to examination.  Head: normocephalic and atraumatic.  Eyes: vision grossly intact, pupils equal, pupils round, pupils reactive to light, no injection and anicteric.  Mouth: pharynx pink and + dry mucous membrane, no erythema, and no exudates.  Neck: supple, full ROM, no thyromegaly, no JVD Lungs: normal respiratory effort, no accessory muscle use, normal breath sounds, no crackles, and no wheezes. Heart: tachycardic, regular rhythm, no murmur, no gallop, and no rub.  Abdomen: soft, +tender to left lower quadrant to palpation, hypoactive bowel sounds, no distention,  no guarding, no rebound tenderness, no hepatomegaly, and no splenomegaly.  Msk: no joint swelling, no joint warmth, and no redness over joints.  Pulses: 2+ DP/PT pulses bilaterally Extremities: No cyanosis, clubbing, edema Neurologic: alert & oriented X3, cranial nerves II-XII intact, strength normal in all extremities, sensation intact to light touch  Skin: healed 1x1.5cm ulcer on right anterior shin of Lower extremity Psych: Oriented X3, memory intact for recent and remote, normally interactive, good eye contact, + anxious appearing, and + depressed appearing.  GU: erythematous vaginal canal, copious thick white discharge, cervix os: erythema, no lesions/ulcers noted, no cervical motion tenderness, no adnexal tenderness.  Admission Lab results:  Basic Metabolic Panel:  [facility]  03/03/11 2052   NA  134*   K  3.7   CL  96   CO2  21   GLUCOSE  299*   BUN  11   CREATININE  0.60   CALCIUM  10.2   MG  --   PHOS  --    Liver Function Tests:  [facility]  03/03/11 2052   AST  15   ALT  6   ALKPHOS  87   BILITOT  0.4   PROT  9.2*   ALBUMIN  3.9     Basename  03/03/11 2052     LIPASE  22   AMYLASE  --    CBC:  Basename  03/03/11 2052   WBC  18.7*   NEUTROABS  16.8*   HGB  11.0*   HCT  35.1*   MCV  59.3*   PLT  625*    Urine Drug Screen (03/04/11): Positive for opiates, cocaine, and tetrahydrocannabinol   UA (03/03/11): turbid, >1000 Glucose, >80 ketones, 30 protein, specific gravity >1.046 (H)  Genital Wet Prep (03/04/11): Many clue cells, few WBC  Quantitative & Qualitative bHCG (03/04/11): negative  Hospital Course by problem list: # Abdominal pain with nausea/vomiting - Patient has been hospitalized several times over the past year for abdominal pain, nausea, and vomiting. These symptoms have been extensively worked up by GI with no clear etiology. Throughout her hospitalization, patient reported left sided abdominal pain not relieved by pain medication. As hospitalization progressed, we managed pain with tylenol only, as opioids would worsen gastroparesis and NSAIDs would worsen gastritis. She also vomited a handful of times during the first few days of hospitalization and had two loose bowel movements towards the end of hospitalization.  Her UDS was postive for cocaine at admission.  She denies cocaine use, but she was in the room while her mom was doing cocaine.  Regardless, cocaine was in her system, and likely caused vasospasms and worsened gastroparesis.  I also suspect pt has baseline gastroparesis at baseline given uncontrolled DM, and she has been on reglan in the past.  In the past, her gastric emptying study was thought to be falsely negative since she was on reglan, but she did not go to any of her GI follow up appointments to further assess problem.  About 3 days prior to discharge, pt reported nausea and pain, but was eating several trays of food, not at all complying with a diabetic diet.  Of note, her fecal occult blood test on 03/09/11 was negative.  CT & US showed no acute pathology except a left sided corpus luteal cyst, that should not cause  the extent of pain that she reports, unless cyst ruptured.   I also suspect depression/anxiety plays a component in patient's N/V. Nausea reportedly persists, but patient eats  very large meals, and I asked that she eat more frequent smaller meals. At this point, the workup can be continued on an outpatient basis.  # Tachycardia - Per chart review, patient's baseline heart rate is in the high 80s to low 90s. She has consistently been above 90 since admission, and on 03/08/11 heart rates were found to spike from high 90s when lying down to 130s-140s when standing. Patient also reported palpitations but no chest pain or shortness of breath. She stated that the last time she felt palpitations like this was just prior to her MI, so an EKG was performed and cardiac enzymes were obtained, both of which were benign. She was placed on labetolol and HCTZ on 03/06/11, and the HCTZ was discontinued on 03/08/11 as pt was thought to be orthostatic. She was also treated with IVF, but remained somewhat tachycardic until just before discharge.  I suspect that there is a component of anxiety related.  Because of lower SBPs, she was not discharged with labetalol.    # Iron deficiency Anemia - Patient's baseline Hgb is 9-11; her Hgb dropped from 10.2 on 03/05/11 to 8.2 on 03/08/11.  She has history of iron deficiency anemia and is not compliant with supplementation.  She likely also has a component of thalassemia.  Chart review suggests Hemoglobin C, and electrophoresis in 2009, but no documentation of electrophoresis itself. Her fecal occult blood test on 03/09/11 was negative, but the following two samples were not taken. Patient noted no blood in her stool or any obvious source of hemorrhage. HCTZ was discontinued on 03/08/11, and a peripheral blood smear was ordered to assess for hemolysis, but was not obtained, and patient's Hb stabilized by day of discharge.  She likely needs increased iron supplementation, but given  recent N/V, I did not increase frequency at hospital discharge.   # DM - Patient's HbA1c on this admission was 11, implying poor control with her current home regimen of 30 units Lantus qHS. We have increased her dose to 45 units Lantus qHS, with sliding scale insulin at meals while inpatient, but her CBGs have still remained elevated because she is not at all compliant with a diabetic diet.  She was discharged with lantus 50u qHS.  This will be followed as outpatient.  # Bacterial Vaginosis - Patient presented with thick, white vaginal discharge and pruritus. A smear of the discharge revealed clue cells, and so she was started on metronidazole 500mg  BID for 7 days total. She reports improvement of symptoms at discharge, at which point she still had 1.5 days of treatment remaining.  # Hypertension - Blood pressures were elevated throughout admission, with the highest BPs in the 180s/130s. She was started on HCTZ and labetalol, and her blood pressures dropped to 100s-110s. Because her heart rates spiked, the HCTZ was initially discontinued, and later labetalol was discontinued as well. BP should be monitored outpatient, as I suspect her initial elevated BPs were 2/2 cocaine in system.   # Polysubstance abuse - Patient's UDS was positive for cocaine and marijuana, although the patient denies cocaine use. She admits to using marijuana, but states that her mother smokes cocaine and had been visiting last week. Her cocaine exposure may be contributing to her current symptoms, and she was evaluated by psychiatry and social work, although she declined any further interventions.  # Depression/Anxiety - Patient appears depressed, and no organic cause for her symptoms has been found. There is likely a somatization component to her current symptoms. Patient  reports significant family stress, which is likely a large contributing factor to her psych and somatic symptoms. Psychiatry was consulted, and the patient  does not wish to pursue treatment with antidepressants, but will continue her home dose of Klonopin. . # History of CAD - Patient's CAD has been stable throughout admission. She has been on aspirin and Plavix, but likely does not need to continue Plavix therapy.  She reports Dr. Jacinto Halim prescribed plavix.  Lipid panel indicates hyperlipidemia, so she should be started on a statin outpatient  with a goal LDL <100.  New medication was not started at d/c given persistent nausea, though vomiting resolved.   # History of Hepatitis B - Stable throughout admission. Hep B surface antigen test was positive, and Hep B e antigen was negative. She is likely not a candidate for treatment given her history of medication noncompliance and substance abuse. Hep A antibody test was negative, so she was immunized against Hep A while inpatient.  #Right Breast Mass: On day of discharge, patient asked that I palpate a mass on her breast that she noticed 2 days prior.  Mass felt cystic & was nickel sized, and pt reports that these bumps commonly come and go for her.  Pt expects her menstrual cycle to start in 4-5 days.  I suspect she experiences FCC.  Discharge Vitals:  BP 117/69  Pulse 69  Temp(Src) 98.6 F (37 C) (Oral)  Resp 18  Ht 5\' 7"  (1.702 m)  Wt 230 lb 13.2 oz (104.7 kg)  BMI 36.15 kg/m2  SpO2 100%  LMP 02/07/2011  Discharge Labs:  Results for orders placed during the hospital encounter of 03/03/11 (from the past 24 hour(s))  GLUCOSE, CAPILLARY     Status: Abnormal   Collection Time   03/09/11  9:24 PM      Component Value Range   Glucose-Capillary 194 (*) 70 - 99 (mg/dL)   Comment 1 Notify RN    CBC     Status: Abnormal   Collection Time   03/10/11  7:10 AM      Component Value Range   WBC 13.1 (*) 4.0 - 10.5 (K/uL)   RBC 4.34  3.87 - 5.11 (MIL/uL)   Hemoglobin 8.1 (*) 12.0 - 15.0 (g/dL)   HCT 16.1 (*) 09.6 - 46.0 (%)   MCV 60.6 (*) 78.0 - 100.0 (fL)   MCH 18.7 (*) 26.0 - 34.0 (pg)   MCHC 30.8   30.0 - 36.0 (g/dL)   RDW 04.5 (*) 40.9 - 15.5 (%)   Platelets 419 (*) 150 - 400 (K/uL)  GLUCOSE, CAPILLARY     Status: Abnormal   Collection Time   03/10/11  7:34 AM      Component Value Range   Glucose-Capillary 186 (*) 70 - 99 (mg/dL)   Celiac Work up: IgA-434 (H), Normal Gliadin IgG & IgA & tissue transglutaminase, IgA & Reticulin Ab HIV: negative   03/01/2010 19:10 03/04/2011 06:42 03/04/2011 11:33 03/06/2011 08:45  Chlamydia  NEGATIVE    GC Probe Amp, Genital  NEGATIVE    Hep A Total Ab    NEGATIVE  Hepatitis B Surface Ag   POSITIVE (A)   Hep B E Ag   Negative   Hepatitis B DNA <20 HBV DNA is detected but not quantifiable.     Hepatitis B DNA (Calc) <116...      SignedVernice Jefferson 03/10/2011, 6:33 PM

## 2011-03-14 ENCOUNTER — Emergency Department (HOSPITAL_COMMUNITY)
Admission: EM | Admit: 2011-03-14 | Discharge: 2011-03-15 | Disposition: A | Discharge status: Home / Self Care | Payer: Medicaid Other | Attending: Emergency Medicine | Admitting: Emergency Medicine

## 2011-03-14 ENCOUNTER — Encounter (HOSPITAL_COMMUNITY): Payer: Self-pay | Admitting: *Deleted

## 2011-03-14 DIAGNOSIS — J45909 Unspecified asthma, uncomplicated: Secondary | ICD-10-CM | POA: Insufficient documentation

## 2011-03-14 DIAGNOSIS — Z794 Long term (current) use of insulin: Secondary | ICD-10-CM | POA: Insufficient documentation

## 2011-03-14 DIAGNOSIS — I2510 Atherosclerotic heart disease of native coronary artery without angina pectoris: Secondary | ICD-10-CM | POA: Insufficient documentation

## 2011-03-14 DIAGNOSIS — G4733 Obstructive sleep apnea (adult) (pediatric): Secondary | ICD-10-CM | POA: Insufficient documentation

## 2011-03-14 DIAGNOSIS — E785 Hyperlipidemia, unspecified: Secondary | ICD-10-CM | POA: Insufficient documentation

## 2011-03-14 DIAGNOSIS — M79673 Pain in unspecified foot: Secondary | ICD-10-CM

## 2011-03-14 DIAGNOSIS — G609 Hereditary and idiopathic neuropathy, unspecified: Secondary | ICD-10-CM | POA: Insufficient documentation

## 2011-03-14 DIAGNOSIS — I10 Essential (primary) hypertension: Secondary | ICD-10-CM | POA: Insufficient documentation

## 2011-03-14 DIAGNOSIS — G43909 Migraine, unspecified, not intractable, without status migrainosus: Secondary | ICD-10-CM | POA: Insufficient documentation

## 2011-03-14 DIAGNOSIS — I252 Old myocardial infarction: Secondary | ICD-10-CM | POA: Insufficient documentation

## 2011-03-14 DIAGNOSIS — F3289 Other specified depressive episodes: Secondary | ICD-10-CM | POA: Insufficient documentation

## 2011-03-14 DIAGNOSIS — M79609 Pain in unspecified limb: Secondary | ICD-10-CM | POA: Insufficient documentation

## 2011-03-14 DIAGNOSIS — E669 Obesity, unspecified: Secondary | ICD-10-CM | POA: Insufficient documentation

## 2011-03-14 DIAGNOSIS — F329 Major depressive disorder, single episode, unspecified: Secondary | ICD-10-CM | POA: Insufficient documentation

## 2011-03-14 DIAGNOSIS — F411 Generalized anxiety disorder: Secondary | ICD-10-CM | POA: Insufficient documentation

## 2011-03-14 DIAGNOSIS — E119 Type 2 diabetes mellitus without complications: Secondary | ICD-10-CM | POA: Insufficient documentation

## 2011-03-14 MED ORDER — HYDROCODONE-ACETAMINOPHEN 5-325 MG PO TABS
1.0000 | ORAL_TABLET | Freq: Once | ORAL | Status: AC
  Administered 2011-03-14: 1 via ORAL
  Filled 2011-03-14: qty 1

## 2011-03-14 NOTE — ED Provider Notes (Signed)
History     CSN: [id number] Arrival date & time: 03/14/2011  7:09 PM   First MD Initiated Contact with Patient 03/14/11 2123      Chief Complaint  Patient presents with  . Foot Pain    (Consider location/radiation/quality/duration/timing/severity/associated sxs/prior treatment) HPI Comments: Patient reports that she was hospitalized on the 03-03-11 for abdominal pain.  She was discharged on 03-10-11.  She reports that on 03-10-11 she began having pain and tingling of her right foot.  Yesterday she began having pain and swelling in her right calf.  She reports that the pain is worse with movement.  She has not noticed any erythema.  She is not on any hormone therapy.  No prior history of DVT or PE.  She denies Chest pain and SOB.  Patient is a 35 y.o. female presenting with lower extremity pain.  Foot Pain Pertinent negatives include no abdominal pain, chest pain, chills, fever, headaches or rash.    Past Medical History  Diagnosis Date  . Thrombocytosis     Hem/Onc suggested 2/2 chronic hepatits and/or iron deficiency anemia  . Iron deficiency anemia   . N&V (nausea and vomiting)     and  Abdominal pain, chronic. Unclear ethiology with multiple admission and ED visits. Work up including Abdominal xray showed mostly nonspecific bowel gas pattern, CT abdomen with and without contrast (01/2010)  showed no acute process. Gastic Emptying scan (01/2010) was normal. Ultrasound of the abdomen was within normal limits. Hepatitis B viral load was undectable. HIV nonreactive. EGD (11/09/  . Abdominal pain   . Hypertension   . Hyperlipidemia   . Diabetes mellitus   . Recurrent boils   . Back pain   . OSA (obstructive sleep apnea)   . 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 Cath 12/10- no restenosis or significant CAD progression  . Irregular menses     Small ovarian follicles seen on  CT(9/06)  . History of pyelonephritis     H/o GrpB Pyelonephritis (9/06) and UTI- 07/11- E.Coli, 12/10- GBS  . Abscess of tunica vaginalis     10/09- Abundant S. aureus- sensitive to all abx  . Obesity   . Asthma   . Gastritis   . Depression   . Hepatitis B, chronic     Hep BeAb+,Hep B cAb+ & Hep BsAg+ (9/06)  . Peripheral neuropathy   . Fibromyalgia   . GERD (gastroesophageal reflux disease)   . MI (myocardial infarction) 2008  . Migraine   . Anxiety   . Urinary tract infection     Past Surgical History  Procedure Date  . Cesarean section 1997    Family History  Problem Relation Age of Onset  . Diabetes Father     History  Substance Use Topics  . Smoking status: Former Smoker    Types: Cigarettes    Quit date: 04/24/1996  . Smokeless tobacco: Not on file  . Alcohol Use: No    OB History    Grav Para Term Preterm Abortions TAB SAB Ect Mult Living                  Review of Systems  Constitutional: Negative for fever and chills.  Respiratory: Negative for chest tightness and shortness of breath.   Cardiovascular: Positive for leg swelling. Negative for chest pain.  Gastrointestinal: Negative for abdominal pain.  Skin: Negative for rash.  Neurological: Negative for dizziness, seizures, syncope, light-headedness and headaches.    Allergies  Lisinopril  Home Medications   Current Outpatient Rx  Name Route Sig Dispense Refill  . ACETAMINOPHEN 325 MG PO TABS Oral Take 2 tablets (650 mg total) by mouth every 6 (six) hours as needed. 30 tablet 0  . ADVAIR DISKUS 100-50 MCG/DOSE IN AEPB  INHALE ONE PUFF TWO TIMES A DAY 1 each 3  . ASPIRIN 325 MG PO TABS Oral Take 325 mg by mouth daily.     Marland Kitchen CLONAZEPAM 0.5 MG PO TABS Oral Take 0.5 mg by mouth 3 (three) times daily as needed. For anxiety.    . CLOPIDOGREL BISULFATE 75 MG PO TABS Oral Take 75 mg by mouth daily.      Marland Kitchen FERROUS GLUCONATE 325 MG PO TABS Oral Take 325 mg by mouth daily with breakfast.      .  INSULIN GLARGINE 100 UNIT/ML [place] SOLN  Inject 50 Units into the skin at bedtime 10 mL 12  . OMEPRAZOLE 40 MG PO CPDR Oral Take 40 mg by mouth daily.      Marland Kitchen PROMETHAZINE HCL 12.5 MG RE SUPP Rectal Place 12.5 mg rectally every 6 (six) hours as needed. For nausea.     Marland Kitchen PROMETHAZINE HCL 12.5 MG PO TABS Oral Take 12.5 mg by mouth every 6 (six) hours as needed. For nausea/vomiting.    . DSS 100 MG PO CAPS Oral Take 100 mg by mouth 2 (two) times daily. 60 capsule 0  . GLUCOSE BLOOD VI STRP Other 1 each by Other route as needed. Use as instructed     . INSULIN PEN NEEDLE 31G X 8 MM MISC Does not apply by Does not apply route.      Marland Kitchen PRODIGY LANCETS 28G MISC Does not apply by Does not apply route. To inject Lantus as directed     . SENNA 8.6 MG PO TABS Oral Take 1 tablet (8.6 mg total) by mouth daily. 120 each 2    BP 141/98  Pulse 110  Temp(Src) 98.3 F (36.8 C) (Oral)  Resp 22  Ht 5\' 7"  (1.702 m)  Wt 190 lb (86.183 kg)  BMI 29.76 kg/m2  SpO2 100%  LMP 03/09/2011  Physical Exam  Constitutional: She is oriented to person, place, and time. She appears well-developed and well-nourished.  HENT:  Head: Normocephalic and atraumatic.  Neck: Normal range of motion. Neck supple.  Cardiovascular: Normal rate, regular rhythm and normal heart sounds.   Pulmonary/Chest: Effort normal and breath sounds normal. No respiratory distress. She has no wheezes.  Musculoskeletal: Normal range of motion. She exhibits no edema and no tenderness.       Tenderness to palpation of the medial portion of the right foot over the arch and tenderness to palpation of the right calf.  Pain increased with dorsiflexion of the foot.  Neurological: She is alert and oriented to person, place, and time.  Skin: Skin is warm and dry. No erythema.  Psychiatric: She has a normal mood and affect.    ED Course  Procedures (including critical care time)   Labs Reviewed  D-DIMER, QUANTITATIVE   No results found.   1. Foot  pain       MDM  D-dimer in the normal range.  Patient on Lovenox during hospitalization.  Therefore, feel that pain is most likely musculoskeletal.        [facility] 03/16/11 2240

## 2011-03-14 NOTE — ED Notes (Signed)
Since Sunday has been having tingling, swelling, pain in right foot and leg.

## 2011-03-14 NOTE — ED Notes (Signed)
Pt.'s right foot reddened and swollen since Sun.  Rates pain as a 10.  Denies trauma. Just discharged from [facility] on Sun.  Describes pain as sharp.

## 2011-03-15 MED ORDER — HYDROCODONE-ACETAMINOPHEN 5-325 MG PO TABS: 2.0000 | ORAL_TABLET | ORAL | Status: DC | PRN

## 2011-03-18 NOTE — ED Provider Notes (Signed)
Medical screening examination/treatment/procedure(s) were performed by non-physician practitioner and as supervising physician I was immediately available for consultation/collaboration.  Raeford Razor, MD 03/18/11 [phone]

## 2011-03-20 ENCOUNTER — Encounter: Payer: Medicaid Other | Admitting: Internal Medicine

## 2011-03-23 ENCOUNTER — Ambulatory Visit (INDEPENDENT_AMBULATORY_CARE_PROVIDER_SITE_OTHER): Payer: Medicaid Other | Admitting: Internal Medicine

## 2011-03-23 ENCOUNTER — Encounter: Payer: Self-pay | Admitting: Internal Medicine

## 2011-03-23 DIAGNOSIS — R1032 Left lower quadrant pain: Secondary | ICD-10-CM

## 2011-03-23 DIAGNOSIS — I10 Essential (primary) hypertension: Secondary | ICD-10-CM

## 2011-03-23 DIAGNOSIS — E119 Type 2 diabetes mellitus without complications: Secondary | ICD-10-CM

## 2011-03-23 DIAGNOSIS — F341 Dysthymic disorder: Secondary | ICD-10-CM

## 2011-03-23 LAB — GLUCOSE, CAPILLARY: Glucose-Capillary: 456 mg/dL — ABNORMAL HIGH (ref 70–99)

## 2011-03-23 NOTE — Assessment & Plan Note (Signed)
Pt is currently taking Klonopin and is controlled on this medication.   Plan:  will be continuing current regimen.

## 2011-03-23 NOTE — Progress Notes (Signed)
Subjective:   Patient ID: Shirley [name] female   DOB: Jan 23, 1976 35 y.o.   MRN: [id number]  HPI: Shirley [name] is a 35 y.o. with a PMHx of uncontrolled DMII, iron-deficiency anemia, CAD who presented to clinic today for the following:  1) Hospital follow-up - Patient was hospitalized at [facility] from November 9 - 16, 2012  for evaluation of chronic LLQ abdominal pain with nausea and vomiting and vaginal discharge, which was determined to be of unclear etiology, but thought at least partially contributed by presumed gastroparesis in the setting of her very poorly controlled diabetes. She has previously had multiple hospitalizations for the same pain with negative GI workup including EGD in 03/2010 showing chronic gastritis  Was treated with antiemetics, supportive care. She was eventually transitioned to Tylenol and was well tolerated per report. Since hospital discharge, patient does not note significant improvement of symptoms, states her pain is the same as previously - states she is having to limit her intake because of the pain. States she is trying to eat smaller meals as was recommended to her.  Is taking recommended medications regularly.   2) BV - was also diagnosed with BV during hospital course, was started Flagyl, was recommended to continue for total of 7 days, however, did not secondary to inability to afford medications. Still having vaginal discharge, itching, but no dysuria, hema  3) DMII - HgA1c 11 - Patient has not been checking blood sugars, because she lost her meter. Currently taking Lantus 50 units.  Admits to polyuria, polydipsia, nausea, vomiting, diarrhea.  does request refills today - but notes she does not have the money to afford to get it.  4) Depression - is on Klonopin for anxiety. Denies symptoms of depression. Denies symptoms of anhedonia, sleep disturbances including excessive sleep or not enough sleep, difficulty with concentration, feelings of isolation,  disengagement from previously enjoyable activities, suicidal ideation, homicidal ideation. States she is not depressed, she is stressed about finances but is not too bad.   Review of Systems: Per HPI.  Current Outpatient Medications: Medication Sig  . aspirin 325 MG tablet Take 325 mg by mouth daily.   . clonazePAM (KLONOPIN) 0.5 MG tablet Take 0.5 mg by mouth 3 (three) times daily as needed. For anxiety.  . clopidogrel (PLAVIX) 75 MG tablet Take 75 mg by mouth daily.    . ferrous gluconate (FERGON) 325 MG tablet Take 325 mg by mouth daily with breakfast.    . glucose blood test strip 1 each by Other route as needed. Use as instructed   . insulin glargine (LANTUS SOLOSTAR) 100 UNIT/ML injection Inject 50 Units into the skin at bedtime  . Insulin Pen Needle (AUR[place]A PEN NEEDLES) 31G X 8 MM MISC by Does not apply route.    Marland Kitchen omeprazole (PRILOSEC) 40 MG capsule Take 40 mg by mouth daily.    Marland Kitchen PRODIGY LANCETS 28G MISC by Does not apply route. To inject Lantus as directed   . promethazine (PHENERGAN) 12.5 MG tablet Take 12.5 mg by mouth every 6 (six) hours as needed. For nausea/vomiting.  . senna (SENOKOT) 8.6 MG TABS Take 1 tablet (8.6 mg total) by mouth daily.  Marland Kitchen ADVAIR DISKUS 100-50 MCG/DOSE AEPB INHALE ONE PUFF TWO TIMES A DAY  . promethazine (PHENERGAN) 12.5 MG suppository Place 12.5 mg rectally every 6 (six) hours as needed. For nausea.       Allergies: Allergen Reactions  . Lisinopril     nauseous    Past Medical  History  Diagnosis Date  . Thrombocytosis     Hem/Onc suggested 2/2 chronic hepatits and/or iron deficiency anemia  . Iron deficiency anemia   . N&V (nausea and vomiting)     and  Abdominal pain, chronic. Unclear ethiology with multiple admission and ED visits. Work up including Abdominal xray showed mostly nonspecific bowel gas pattern, CT abdomen with and without contrast (01/2010)  showed no acute process. Gastic Emptying scan (01/2010) was normal. Ultrasound of the  abdomen was within normal limits. Hepatitis B viral load was undectable. HIV nonreactive. EGD (11/09/  . Abdominal pain   . Hypertension   . Hyperlipidemia   . Diabetes mellitus   . Recurrent boils   . Back pain   . OSA (obstructive sleep apnea)   . 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 Cath 12/10- no restenosis or significant CAD progression  . Irregular menses     Small ovarian follicles seen on CT(9/06)  . History of pyelonephritis     H/o GrpB Pyelonephritis (9/06) and UTI- 07/11- E.Coli, 12/10- GBS  . Abscess of tunica vaginalis     10/09- Abundant S. aureus- sensitive to all abx  . Obesity   . Asthma   . Gastritis   . Depression   . Hepatitis B, chronic     Hep BeAb+,Hep B cAb+ & Hep BsAg+ (9/06)  . Peripheral neuropathy   . Fibromyalgia   . GERD (gastroesophageal reflux disease)   . MI (myocardial infarction) 2008  . Migraine   . Anxiety   . Urinary tract infection     Past Surgical History  Procedure Date  . Cesarean section 1997     Objective:   Physical Exam: BP 128/89  Pulse 98  Temp(Src) 98.3 F (36.8 C) (Oral)  Ht 5\' 7"  (1.702 m)  Wt 229 lb 11.2 oz (104.191 kg)  BMI 35.98 kg/m2  LMP 03/09/2011   General: Vital signs reviewed and noted. Well-developed, well-nourished, in no acute distress; alert, appropriate and cooperative throughout examination.  Head: Normocephalic, atraumatic.  Lungs:  Normal respiratory effort. Clear to auscultation BL without crackles or wheezes.  Heart: RRR. S1 and S2 normal without gallop, murmur, or rubs.  Abdomen:  BS normoactive. Soft, Nondistended, non-tender.  No masses or organomegaly.  Extremities: No pretibial edema.    Assessment & Plan:   Case and plan of care discussed with Dr. Ulyess Mort.

## 2011-03-23 NOTE — Patient Instructions (Addendum)
Please follow-up at the clinic in 1 week, at which time we will reevaluate right foot pain  Please follow-up in the clinic sooner if needed.  Diabetes - Lantus to stay the same.  Can use some Tylenol for your pain in the abdomen.  If you have been started on new medication(s), and you develop symptoms concerning for allergic reaction, including, but not limited to, throat closing, tongue swelling, rash, please stop the medication immediately and call the clinic at [phone], and go to the ER.  If you are diabetic, please bring your meter to your next visit.  If symptoms worsen, or new symptoms arise, please call the clinic or go to the ER.  Please bring all of your medications in a bag to your next visit.   Gastroparesis   Gastroparesis is also called slowed stomach emptying (delayed gastric emptying). It is a condition in which the stomach takes too long to empty its contents. It often happens in people with diabetes.   CAUSES   Gastroparesis happens when nerves to the stomach are damaged or stop working. When the nerves are damaged, the muscles of the stomach and intestines do not work normally. The movement of food is slowed or stopped. High blood glucose (sugar) causes changes in nerves and can damage the blood vessels that carry oxygen and nutrients to the nerves. RISK FACTORS  Diabetes.     Post-viral syndromes.     Eating disorders (anorexia, bulimia).     Surgery on the stomach or vagus nerve.     Gastroesophageal reflux disease (rarely).     Smooth muscle disorders (amyloidosis, scleroderma).     Metabolic disorders, including hypothyroidism.     Parkinson's disease.  SYMPTOMS    Heartburn.     Feeling sick to your stomach (nausea).     Vomiting of undigested food.     An early feeling of fullness when eating.     Weight loss.     Abdominal bloating.     Erratic blood glucose levels.     Lack of appetite.     Gastroesophageal reflux.     Spasms of  the stomach wall.  Complications can include:  Bacterial overgrowth in stomach. Food stays in the stomach and can ferment and cause bacteria to grow.     Weight loss due to difficulty digesting and absorbing nutrients.     Vomiting.    Obstruction in the stomach. Undigested food can harden and cause nausea and vomiting.     Blood glucose fluctuations caused by inconsistent food absorption.  DIAGNOSIS   The diagnosis of gastroparesis is confirmed through one or more of the following tests:  Barium X-rays and scans. These tests look at how long it takes for food to move through the stomach.     Gastric manometry. This test measures electrical and muscular activity in the stomach. A thin tube is passed down the throat into the stomach. The tube contains a wire that takes measurements of the stomach's electrical and muscular activity as it digests liquids and solid food.     Endoscopy. This procedure is done with a long, thin tube called an endoscope. It is passed through the mouth and gently guides down the esophagus into the stomach. This tube helps the caregiver look at the lining of the stomach to check for any abnormalities.     Ultrasound. This can rule out gallbladder disease or pancreatitis. This test will outline and define the shape of the gallbladder and  pancreas.  TREATMENT    The primary treatment is to identify the problem and help control blood glucose levels. Treatments include:     Exercise.    Medicines to control nausea and vomiting.     Medicines to stimulate stomach muscles.     Changes in what and when you eat.     Having smaller meals more often.     Eating low-fiber forms of high-fiber foods, such aseating cooked vegetables instead of raw vegetables.     Eating low-fat foods.     Consuming liquids, which are easier to digest.     In severe cases, feeding tubes and intravenous (IV) feeding may be needed.  It is important to note that in most cases,  treatment does not cure gastroparesis. It is usually a lasting (chronic) condition. Treatment helps you manage the condition so that you can be as healthy and comfortable as possible. NEW TREATMENTS  A gastric neurostimulator has been developed to assist people with gastroparesis. The battery-operated device is surgically implanted. It emits mild electrical pulses to help improve stomach emptying and to control nausea and vomiting.     The use of botulinum toxin has been shown to improve stomach emptying by decreasing the prolonged contractions of the muscle between the stomach and the small intestine (pyloric sphincter). The benefits are temporary.  SEEK MEDICAL CARE IF:    You are having problems keeping your blood glucose in goal range.     You are having nausea, vomiting, bloating, or early feelings of fullness with eating.     Your symptoms do not change with a change in diet.  Document Released: 04/10/2005 Document Revised: 12/21/2010 Document Reviewed: 09/17/2008 [facility] Patient Information 2012 [place], [place].

## 2011-03-23 NOTE — Assessment & Plan Note (Signed)
Labs: Lab Results  Component Value Date   HGBA1C 11.0* 03/04/2011   HGBA1C 8.7 09/27/2010   CREATININE 0.64 03/09/2011   MICROALBUR 14.44* 08/30/2009   MICRALBCREAT 141.8* 08/30/2009   CHOL 204* 03/04/2011   HDL 50 03/04/2011   TRIG 140 03/04/2011   Last eye exam and foot exam:    Component Value Date/Time   HMDIABFOOTEX done 01/07/2010     Assessment:  Disease Control: not controlled  Progress toward goals: unable to assess  Barriers to meeting goals: pt not checking blood sugars regularly.    Plan: Glucometer log was not reviewed today, as pt did not have glucometer available for review.     continue current medications  Given glucometer, asked to check at least once daily before breakfast, bring next visit.

## 2011-03-24 ENCOUNTER — Encounter: Payer: Medicaid Other | Admitting: Internal Medicine

## 2011-03-24 ENCOUNTER — Ambulatory Visit: Payer: Medicaid Other | Admitting: Internal Medicine

## 2011-03-29 ENCOUNTER — Emergency Department (HOSPITAL_COMMUNITY)
Admission: EM | Admit: 2011-03-29 | Discharge: 2011-03-29 | Disposition: A | Discharge status: Home / Self Care | Payer: Medicaid Other | Attending: Emergency Medicine | Admitting: Emergency Medicine

## 2011-03-29 ENCOUNTER — Encounter (HOSPITAL_COMMUNITY): Payer: Self-pay | Admitting: *Deleted

## 2011-03-29 DIAGNOSIS — I10 Essential (primary) hypertension: Secondary | ICD-10-CM | POA: Insufficient documentation

## 2011-03-29 DIAGNOSIS — R109 Unspecified abdominal pain: Secondary | ICD-10-CM

## 2011-03-29 DIAGNOSIS — G8929 Other chronic pain: Secondary | ICD-10-CM

## 2011-03-29 DIAGNOSIS — G4733 Obstructive sleep apnea (adult) (pediatric): Secondary | ICD-10-CM | POA: Insufficient documentation

## 2011-03-29 DIAGNOSIS — E119 Type 2 diabetes mellitus without complications: Secondary | ICD-10-CM | POA: Insufficient documentation

## 2011-03-29 DIAGNOSIS — I252 Old myocardial infarction: Secondary | ICD-10-CM | POA: Insufficient documentation

## 2011-03-29 DIAGNOSIS — Z79899 Other long term (current) drug therapy: Secondary | ICD-10-CM | POA: Insufficient documentation

## 2011-03-29 DIAGNOSIS — R1032 Left lower quadrant pain: Secondary | ICD-10-CM | POA: Insufficient documentation

## 2011-03-29 LAB — URINALYSIS, ROUTINE W REFLEX MICROSCOPIC
Bilirubin Urine: NEGATIVE
Ketones, ur: NEGATIVE mg/dL
Leukocytes, UA: NEGATIVE
Nitrite: NEGATIVE
Specific Gravity, Urine: 1.044 — ABNORMAL HIGH (ref 1.005–1.030)
Urobilinogen, UA: 1 mg/dL (ref 0.0–1.0)
pH: 6 (ref 5.0–8.0)

## 2011-03-29 LAB — BASIC METABOLIC PANEL
BUN: 11 mg/dL (ref 6–23)
Chloride: 100 mEq/L (ref 96–112)
Creatinine, Ser: 0.61 mg/dL (ref 0.50–1.10)
GFR calc Af Amer: >90 mL/min (ref >90)
GFR calc non Af Amer: >90 mL/min (ref >90)

## 2011-03-29 LAB — GLUCOSE, CAPILLARY: Glucose-Capillary: 353 mg/dL — ABNORMAL HIGH (ref 70–99)

## 2011-03-29 LAB — POCT PREGNANCY, URINE: Preg Test, Ur: NEGATIVE

## 2011-03-29 MED ORDER — PROMETHAZINE HCL 25 MG PO TABS
25.0000 mg | ORAL_TABLET | ORAL | Status: DC | PRN
  Administered 2011-03-29: 25 mg via ORAL
  Filled 2011-03-29: qty 1

## 2011-03-29 MED ORDER — PROMETHAZINE HCL 25 MG PO TABS: 50.0000 mg | ORAL_TABLET | ORAL | Status: DC | PRN

## 2011-03-29 MED ORDER — MORPHINE SULFATE 2 MG/ML IJ SOLN
2.0000 mg | Freq: Once | INTRAMUSCULAR | Status: AC
  Administered 2011-03-29: 2 mg via INTRAVENOUS
  Filled 2011-03-29: qty 1

## 2011-03-29 MED ORDER — INSULIN GLARGINE 100 UNIT/ML ~~LOC~~ SOLN
25.0000 [IU] | Freq: Once | SUBCUTANEOUS | Status: AC
  Administered 2011-03-29: 25 [IU] via SUBCUTANEOUS
  Filled 2011-03-29: qty 1

## 2011-03-29 MED ORDER — PROMETHAZINE HCL 25 MG PO TABS: 25.0000 mg | ORAL_TABLET | Freq: Four times a day (QID) | ORAL | Status: DC | PRN

## 2011-03-29 NOTE — ED Notes (Signed)
Pt ambulating in lobby, informed again, waiting for room to come available, pt verbalizes understanding regarding the delays.

## 2011-03-29 NOTE — ED Provider Notes (Signed)
I have personally seen and examined the patient.  I have discussed the plan of care with the resident.  I have reviewed the documentation on PMH/FH/Soc. History.  I have reviewed the documentation of the resident and agree.  Doug Sou, MD 03/29/11 [phone]

## 2011-03-29 NOTE — ED Provider Notes (Signed)
Seen with resident physician.. Complains of left-sided abdominal pain for 1.5 years worse since yesterday. Patient presently hungry. No treatment prior to coming here. Patient has had extensive evaluation outpatient inpatient for same pain. Pain contract was discontinued by clinic due to her violation of pain contract and cocaine abuse. Pain contract was for chronic abdominal pain. I exam awake alert no distress patient states she is hungry abdomen obese mild tenderness left side  Doug Sou, MD 03/29/11 1755

## 2011-03-29 NOTE — ED Notes (Signed)
Pt c/o of LLQ pain that has been intermittent since Nov 2011. Pt was seen in Nov and admitted to the hospital for the same. Pt reports that pain started again 2 days ago. Pt reports Nausea and vomiting for 1 day. Denies fever. Pain is 10/10. Pt lying in bed watching TV

## 2011-03-29 NOTE — ED Notes (Signed)
Pt reassessed  Due to wait, pt shows no signs of distress, VSS, pt aware of delay in treatment due to no room availability, pt continues to be observed and kept updated on delays.

## 2011-03-29 NOTE — ED Notes (Signed)
Pt at home, developed LLQ pain around 1700. Has a history of abdominal pain of unknown etiology and has been to the ER multiple times for the pain. Pt reports nausea and vomiting. Reports sharp, constant pain 10/10. Reports vomiting 4 times prior to EMS arrival. No witnessed vomiting by EMS. No medications administered by EMS. Pt's CBG was 397 though has not taken her nightly insulin. No s/s related to the hyperglycemia.

## 2011-03-29 NOTE — ED Provider Notes (Signed)
History     CSN: [id number] Arrival date & time: 03/29/2011  5:17 AM   First MD Initiated Contact with Patient 03/29/11 [id number]      Chief Complaint  Patient presents with  . Abdominal Pain    (Consider location/radiation/quality/duration/timing/severity/associated sxs/prior treatment) HPI  Shirley [name] is a 35 year old woman with PMH significant for chronic LLQ abdominal pain and nausea, fibromyalgia, and polysubstance abuse, and DM2 who presents with exacerbation of her chronic abdominal pain x1 day.  She states she was walking around downtown yesterday when she started having left lower quadrant abdominal pain. She reports gradual onset of nausea. She describes the pain as 10/10 constant, sharp and with a contraction-like character. She denies any radiation. She states her home dose of Phenergan helped relieve her nausea. She has not vomited since early this morning when she was at home. She denies any fevers or chills, diarrhea or constipation. She states her last bowel movement was this morning and was regular. She endorses feeling lightheaded, but this is not exacerbated when she goes from laying to standing. She endorses urinary frequency but no burning. She states that this feels exactly like her regular exacerbation of chronic abdominal pain.    She states that she has had approximately 10 admissions for the past for her left lower quadrant pain and has never been given a concrete diagnosis. She was most recently admitted from November 9 through the 16th. At that time imaging was negative for any acute abdominal pathology. Her presumed cause of at discharge was multifactorial secondary to gastroparesis from diabetes and opiate use, as well as vasoconstriction from cocaine abuse. She had an EGD last year that revealed gastritis. She has never had a colonoscopy.     Past Medical History  Diagnosis Date  . Thrombocytosis     Hem/Onc suggested 2/2 chronic hepatits and/or iron deficiency  anemia  . Iron deficiency anemia   . N&V (nausea and vomiting)     and  Abdominal pain, chronic. Unclear ethiology with multiple admission and ED visits. Work up including Abdominal xray showed mostly nonspecific bowel gas pattern, CT abdomen with and without contrast (01/2010)  showed no acute process. Gastic Emptying scan (01/2010) was normal. Ultrasound of the abdomen was within normal limits. Hepatitis B viral load was undectable. HIV nonreactive. EGD (11/09/  . Abdominal pain   . Hypertension   . Hyperlipidemia   . Diabetes mellitus   . Recurrent boils   . Back pain   . OSA (obstructive sleep apnea)   . 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 Cath 12/10- no restenosis or significant CAD progression  . Irregular menses     Small ovarian follicles seen on CT(9/06)  . History of pyelonephritis     H/o GrpB Pyelonephritis (9/06) and UTI- 07/11- E.Coli, 12/10- GBS  . Abscess of tunica vaginalis     10/09- Abundant S. aureus- sensitive to all abx  . Obesity   . Asthma   . Gastritis   . Depression   . Hepatitis B, chronic     Hep BeAb+,Hep B cAb+ & Hep BsAg+ (9/06)  . Peripheral neuropathy   . Fibromyalgia   . GERD (gastroesophageal reflux disease)   . MI (myocardial infarction) 2008  . Migraine   . Anxiety   . Urinary tract infection     Past Surgical History  Procedure Date  .  Cesarean section 1997    Family History  Problem Relation Age of Onset  . Diabetes Father     History  Substance Use Topics  . Smoking status: Former Smoker    Types: Cigarettes    Quit date: 04/24/1996  . Smokeless tobacco: Not on file  . Alcohol Use: No    OB History    Grav Para Term Preterm Abortions TAB SAB Ect Mult Living                  Review of Systems  Constitutional: Negative for fever, chills, diaphoresis, appetite change and fatigue.  HENT: Negative for congestion  and rhinorrhea.   Respiratory: Negative for cough, choking, chest tightness and wheezing.   Cardiovascular: Negative for chest pain and leg swelling.  Gastrointestinal: Positive for nausea, vomiting and abdominal pain. Negative for diarrhea, constipation, blood in stool, abdominal distention and anal bleeding.  Genitourinary: Positive for frequency. Negative for dysuria, urgency, difficulty urinating and pelvic pain.  Neurological: Positive for light-headedness and headaches. Negative for dizziness.    Allergies  Lisinopril  Home Medications   Current Outpatient Rx  Name Route Sig Dispense Refill  . ADVAIR DISKUS 100-50 MCG/DOSE IN AEPB  INHALE ONE PUFF TWO TIMES A DAY 1 each 3  . ASPIRIN 325 MG PO TABS Oral Take 325 mg by mouth daily.     Marland Kitchen CLONAZEPAM 0.5 MG PO TABS Oral Take 0.5 mg by mouth 3 (three) times daily as needed. For anxiety.    . CLOPIDOGREL BISULFATE 75 MG PO TABS Oral Take 75 mg by mouth every morning.     Marland Kitchen FERROUS GLUCONATE 325 MG PO TABS Oral Take 325 mg by mouth daily with breakfast.     . INSULIN GLARGINE 100 UNIT/ML [place] SOLN  Inject 50 Units into the skin at bedtime 10 mL 12  . OMEPRAZOLE 40 MG PO CPDR Oral Take 40 mg by mouth daily.     Marland Kitchen PRESCRIPTION MEDICATION  Unknown blood pressure medication, patient states she was taking metoprolol but possibly changed. See notes     . PROMETHAZINE HCL 25 MG PO TABS Oral Take 25 mg by mouth every 6 (six) hours as needed. For nausea     . GLUCOSE BLOOD VI STRP Other 1 each by Other route as needed. Use as instructed     . INSULIN PEN NEEDLE 31G X 8 MM MISC Does not apply by Does not apply route.      Marland Kitchen PRODIGY LANCETS 28G MISC Does not apply by Does not apply route. To inject Lantus as directed       BP 147/96  Pulse 93  Temp(Src) 98.3 F (36.8 C) (Oral)  Resp 18  SpO2 100%  LMP 03/09/2011  Physical Exam  Constitutional: She is oriented to person, place, and time. She appears well-developed and well-nourished. No  distress.  HENT:  Head: Normocephalic and atraumatic.  Mouth/Throat: No oropharyngeal exudate.  Eyes: Conjunctivae and EOM are normal. Pupils are equal, round, and reactive to light.  Cardiovascular: Normal rate, regular rhythm and intact distal pulses.  Exam reveals no gallop and no friction rub.   No murmur heard. Pulmonary/Chest: Effort normal and breath sounds normal. No respiratory distress. She has no wheezes. She has no rales. She exhibits no tenderness.  Abdominal: Soft. Bowel sounds are normal. She exhibits no distension and no mass. There is no tenderness. There is no rebound and no guarding.  Musculoskeletal: She exhibits no edema.  Neurological: She is alert  and oriented to person, place, and time. No cranial nerve deficit.  Skin: Skin is dry. No rash noted. She is not diaphoretic. No erythema. No pallor.  Psychiatric: She has a normal mood and affect. Her behavior is normal. Judgment and thought content normal.    ED Course  Procedures (including critical care time)  Labs Reviewed  GLUCOSE, CAPILLARY - Abnormal; Notable for the following:    Glucose-Capillary 353 (*)    All other components within normal limits  URINALYSIS, ROUTINE W REFLEX MICROSCOPIC - Abnormal; Notable for the following:    Specific Gravity, Urine 1.044 (*)    Glucose, UA >1000 (*)    All other components within normal limits  BASIC METABOLIC PANEL - Abnormal; Notable for the following:    Sodium 132 (*)    Glucose, Bld 306 (*)    All other components within normal limits  URINE MICROSCOPIC-ADD ON - Abnormal; Notable for the following:    Squamous Epithelial / LPF FEW (*)    Bacteria, UA FEW (*)    All other components within normal limits  POCT PREGNANCY, URINE  POCT CBG MONITORING  POCT PREGNANCY, URINE   No results found.   No diagnosis found.    MDM  Shirley [name] likely has an exacerbation of her chronic multifactorial abdominal pain. She's displayed no vomiting since arrival to the ED  approximately 12 hours ago. She is able to eat and drink without difficulty or vomiting.  After receiving oral Phenergan in the ED she felt comfortable to go home with a prescription for Phenergan. She was still having nausea and abdominal pain though this was tolerable to her. Her labs are unremarkable except for a high CBG and likely pseudohyponatremia.  She states that she has a clinic appointment scheduled for tomorrow to be seen in the internal medicine residency clinic. She will be reevaluated there for her chronic abdominal pain.       Quentin Ore, MD 03/29/11 [phone]

## 2011-03-30 ENCOUNTER — Encounter: Payer: Medicaid Other | Admitting: Internal Medicine

## 2011-03-30 ENCOUNTER — Other Ambulatory Visit: Payer: Self-pay

## 2011-03-30 ENCOUNTER — Ambulatory Visit: Payer: Medicaid Other | Admitting: Internal Medicine

## 2011-03-30 ENCOUNTER — Encounter (HOSPITAL_COMMUNITY): Payer: Self-pay | Admitting: Internal Medicine

## 2011-03-30 ENCOUNTER — Encounter: Payer: Self-pay | Admitting: Internal Medicine

## 2011-03-30 ENCOUNTER — Ambulatory Visit (INDEPENDENT_AMBULATORY_CARE_PROVIDER_SITE_OTHER): Payer: Medicaid Other | Admitting: Internal Medicine

## 2011-03-30 ENCOUNTER — Inpatient Hospital Stay (HOSPITAL_COMMUNITY)
Admission: AD | Admit: 2011-03-30 | Discharge: 2011-04-01 | DRG: 392 | Disposition: A | Discharge status: Home / Self Care | Payer: Medicaid Other | Source: Ambulatory Visit | Attending: Internal Medicine | Admitting: Internal Medicine

## 2011-03-30 VITALS — BP 180/125 | HR 112 | Temp 97.8°F | Ht 67.0 in | Wt 233.5 lb

## 2011-03-30 DIAGNOSIS — I252 Old myocardial infarction: Secondary | ICD-10-CM

## 2011-03-30 DIAGNOSIS — IMO0001 Reserved for inherently not codable concepts without codable children: Secondary | ICD-10-CM | POA: Diagnosis present

## 2011-03-30 DIAGNOSIS — E785 Hyperlipidemia, unspecified: Secondary | ICD-10-CM | POA: Diagnosis present

## 2011-03-30 DIAGNOSIS — R109 Unspecified abdominal pain: Secondary | ICD-10-CM | POA: Diagnosis present

## 2011-03-30 DIAGNOSIS — F1210 Cannabis abuse, uncomplicated: Secondary | ICD-10-CM | POA: Diagnosis present

## 2011-03-30 DIAGNOSIS — N760 Acute vaginitis: Secondary | ICD-10-CM | POA: Diagnosis present

## 2011-03-30 DIAGNOSIS — A499 Bacterial infection, unspecified: Secondary | ICD-10-CM | POA: Diagnosis present

## 2011-03-30 DIAGNOSIS — Z794 Long term (current) use of insulin: Secondary | ICD-10-CM

## 2011-03-30 DIAGNOSIS — R1031 Right lower quadrant pain: Secondary | ICD-10-CM

## 2011-03-30 DIAGNOSIS — R000 Tachycardia, unspecified: Secondary | ICD-10-CM | POA: Diagnosis present

## 2011-03-30 DIAGNOSIS — G4733 Obstructive sleep apnea (adult) (pediatric): Secondary | ICD-10-CM | POA: Diagnosis present

## 2011-03-30 DIAGNOSIS — G609 Hereditary and idiopathic neuropathy, unspecified: Secondary | ICD-10-CM | POA: Diagnosis present

## 2011-03-30 DIAGNOSIS — R1032 Left lower quadrant pain: Secondary | ICD-10-CM

## 2011-03-30 DIAGNOSIS — G43909 Migraine, unspecified, not intractable, without status migrainosus: Secondary | ICD-10-CM | POA: Diagnosis present

## 2011-03-30 DIAGNOSIS — K3184 Gastroparesis: Secondary | ICD-10-CM | POA: Diagnosis present

## 2011-03-30 DIAGNOSIS — B181 Chronic viral hepatitis B without delta-agent: Secondary | ICD-10-CM | POA: Diagnosis present

## 2011-03-30 DIAGNOSIS — D509 Iron deficiency anemia, unspecified: Secondary | ICD-10-CM | POA: Diagnosis present

## 2011-03-30 DIAGNOSIS — F341 Dysthymic disorder: Secondary | ICD-10-CM | POA: Diagnosis present

## 2011-03-30 DIAGNOSIS — E1149 Type 2 diabetes mellitus with other diabetic neurological complication: Secondary | ICD-10-CM | POA: Diagnosis present

## 2011-03-30 DIAGNOSIS — F1410 Cocaine abuse, uncomplicated: Secondary | ICD-10-CM | POA: Diagnosis present

## 2011-03-30 DIAGNOSIS — I2510 Atherosclerotic heart disease of native coronary artery without angina pectoris: Secondary | ICD-10-CM | POA: Diagnosis present

## 2011-03-30 DIAGNOSIS — I10 Essential (primary) hypertension: Secondary | ICD-10-CM | POA: Diagnosis present

## 2011-03-30 DIAGNOSIS — E669 Obesity, unspecified: Secondary | ICD-10-CM | POA: Diagnosis present

## 2011-03-30 DIAGNOSIS — R112 Nausea with vomiting, unspecified: Principal | ICD-10-CM | POA: Diagnosis present

## 2011-03-30 DIAGNOSIS — J45909 Unspecified asthma, uncomplicated: Secondary | ICD-10-CM | POA: Diagnosis present

## 2011-03-30 DIAGNOSIS — G8929 Other chronic pain: Secondary | ICD-10-CM | POA: Diagnosis present

## 2011-03-30 DIAGNOSIS — B9689 Other specified bacterial agents as the cause of diseases classified elsewhere: Secondary | ICD-10-CM | POA: Diagnosis present

## 2011-03-30 HISTORY — DX: Disease of blood and blood-forming organs, unspecified: D75.9

## 2011-03-30 LAB — COMPREHENSIVE METABOLIC PANEL
Albumin: 3.6 g/dL (ref 3.5–5.2)
BUN: 11 mg/dL (ref 6–23)
CO2: 25 mEq/L (ref 19–32)
Calcium: 9.8 mg/dL (ref 8.4–10.5)
Chloride: 100 mEq/L (ref 96–112)
Creat: 0.63 mg/dL (ref 0.50–1.10)
Glucose, Bld: 297 mg/dL — ABNORMAL HIGH (ref 70–99)
Potassium: 3.6 mEq/L (ref 3.5–5.3)

## 2011-03-30 LAB — URINALYSIS, ROUTINE W REFLEX MICROSCOPIC
Glucose, UA: >1000 mg/dL — AB
Hgb urine dipstick: NEGATIVE
Ketones, ur: 40 mg/dL — AB
Protein, ur: NEGATIVE mg/dL
pH: 7 (ref 5.0–8.0)

## 2011-03-30 LAB — CBC WITH DIFFERENTIAL/PLATELET
Basophils Relative: 0 % (ref 0–1)
HCT: 32.3 % — ABNORMAL LOW (ref 36.0–46.0)
Hemoglobin: 10.4 g/dL — ABNORMAL LOW (ref 12.0–15.0)
Lymphocytes Relative: 21 % (ref 12–46)
MCHC: 32.2 g/dL (ref 30.0–36.0)
Monocytes Relative: 5 % (ref 3–12)
Neutro Abs: 9.7 10*3/uL — ABNORMAL HIGH (ref 1.7–7.7)
Neutrophils Relative %: 73 % (ref 43–77)
RBC: 5.28 MIL/uL — ABNORMAL HIGH (ref 3.87–5.11)
WBC: 13.3 10*3/uL — ABNORMAL HIGH (ref 4.0–10.5)

## 2011-03-30 LAB — URINE MICROSCOPIC-ADD ON

## 2011-03-30 LAB — GLUCOSE, CAPILLARY: Glucose-Capillary: 255 mg/dL — ABNORMAL HIGH (ref 70–99)

## 2011-03-30 MED ORDER — ACETAMINOPHEN 650 MG RE SUPP: 650.0000 mg | Freq: Four times a day (QID) | RECTAL | Status: DC | PRN

## 2011-03-30 MED ORDER — ENOXAPARIN SODIUM 40 MG/0.4ML ~~LOC~~ SOLN
40.0000 mg | SUBCUTANEOUS | Status: DC
  Administered 2011-03-30 – 2011-03-31 (×2): 40 mg via SUBCUTANEOUS
  Filled 2011-03-30 (×3): qty 0.4

## 2011-03-30 MED ORDER — PANTOPRAZOLE SODIUM 40 MG IV SOLR
40.0000 mg | INTRAVENOUS | Status: DC
  Administered 2011-03-31: 40 mg via INTRAVENOUS
  Filled 2011-03-30 (×3): qty 40

## 2011-03-30 MED ORDER — PROMETHAZINE HCL 25 MG/ML IJ SOLN
25.0000 mg | Freq: Once | INTRAMUSCULAR | Status: AC
  Administered 2011-03-30: 25 mg via INTRAVENOUS

## 2011-03-30 MED ORDER — HYDRALAZINE HCL 20 MG/ML IJ SOLN
10.0000 mg | Freq: Once | INTRAMUSCULAR | Status: AC
  Administered 2011-03-30: 10 mg via INTRAVENOUS
  Filled 2011-03-30: qty 1

## 2011-03-30 MED ORDER — INSULIN GLARGINE 100 UNIT/ML ~~LOC~~ SOLN
30.0000 [IU] | Freq: Every day | SUBCUTANEOUS | Status: DC
  Administered 2011-03-30: 30 [IU] via SUBCUTANEOUS
  Filled 2011-03-30: qty 3

## 2011-03-30 MED ORDER — SODIUM CHLORIDE 0.9 % IV SOLN
INTRAVENOUS | Status: DC
  Administered 2011-03-30: 19:00:00 via INTRAVENOUS

## 2011-03-30 MED ORDER — DOCUSATE SODIUM 100 MG PO CAPS
100.0000 mg | ORAL_CAPSULE | Freq: Two times a day (BID) | ORAL | Status: DC
  Administered 2011-04-01: 100 mg via ORAL
  Filled 2011-03-30 (×5): qty 1

## 2011-03-30 MED ORDER — INSULIN ASPART 100 UNIT/ML ~~LOC~~ SOLN
0.0000 [IU] | Freq: Three times a day (TID) | SUBCUTANEOUS | Status: DC
  Administered 2011-03-31: 2 [IU] via SUBCUTANEOUS
  Administered 2011-03-31: 3 [IU] via SUBCUTANEOUS
  Administered 2011-03-31: 5 [IU] via SUBCUTANEOUS
  Administered 2011-04-01 (×2): 1 [IU] via SUBCUTANEOUS
  Filled 2011-03-30: qty 3

## 2011-03-30 MED ORDER — LORAZEPAM 2 MG/ML IJ SOLN: 1.0000 mg | INTRAMUSCULAR | Status: DC | PRN

## 2011-03-30 MED ORDER — FLUTICASONE-SALMETEROL 100-50 MCG/DOSE IN AEPB
1.0000 | INHALATION_SPRAY | Freq: Two times a day (BID) | RESPIRATORY_TRACT | Status: DC
  Filled 2011-03-30: qty 14

## 2011-03-30 MED ORDER — SODIUM CHLORIDE 0.9 % IV BOLUS (SEPSIS)
1000.0000 mL | Freq: Once | INTRAVENOUS | Status: AC
  Administered 2011-03-30: 1000 mL via INTRAVENOUS

## 2011-03-30 MED ORDER — SODIUM CHLORIDE 0.9 % IV SOLN
INTRAVENOUS | Status: DC
  Administered 2011-03-30 – 2011-04-01 (×3): via INTRAVENOUS

## 2011-03-30 MED ORDER — ZOLPIDEM TARTRATE 5 MG PO TABS: 5.0000 mg | ORAL_TABLET | Freq: Every evening | ORAL | Status: DC | PRN

## 2011-03-30 MED ORDER — ENOXAPARIN SODIUM 40 MG/0.4ML ~~LOC~~ SOLN: 40.0000 mg | SUBCUTANEOUS | Status: DC

## 2011-03-30 MED ORDER — KETOROLAC TROMETHAMINE 30 MG/ML IJ SOLN: 30.0000 mg | Freq: Four times a day (QID) | INTRAMUSCULAR | Status: DC | PRN

## 2011-03-30 MED ORDER — PROMETHAZINE HCL 25 MG/ML IJ SOLN
25.0000 mg | Freq: Four times a day (QID) | INTRAMUSCULAR | Status: DC | PRN
  Administered 2011-03-30 – 2011-03-31 (×3): 25 mg via INTRAVENOUS
  Filled 2011-03-30 (×4): qty 1

## 2011-03-30 MED ORDER — METRONIDAZOLE IVPB CUSTOM
1.0000 g | Freq: Once | INTRAVENOUS | Status: AC
  Administered 2011-03-30: 1 g via INTRAVENOUS
  Filled 2011-03-30 (×2): qty 200

## 2011-03-30 MED ORDER — PROMETHAZINE HCL 25 MG/ML IJ SOLN: 25.0000 mg | Freq: Four times a day (QID) | INTRAMUSCULAR | Status: DC | PRN

## 2011-03-30 NOTE — Patient Instructions (Signed)
Admission

## 2011-03-30 NOTE — H&P (Signed)
Shirley [name] is an 35 y.o. female.   Chief Complaint: Nausea and vomiting  HPI: [name] is a 35 y.o. woman who presents today for follow up from her ED visit yesterday. She presented to the [facility] ED on 03/29/11 with a 1 day history of left lower quadrant pain similar to her usual flares of chronic abdominal pain. She was given phenergan and 1 dose of 2 mg of morphine and was discharged home because she was better and was eating. She states she went home and laid in bed all day. This morning she hasn't eaten anything because of nausea, and subsequently started retching. She presented to clinic and continued to retch throughout her stay in clinic. She states that she has not eaten since the sandwich in the ED. She took 25 mg of phenergan this morning but threw it back up. She states she had a bowel movement after straining this morning that was hard and small. She denies fevers, chills, or chest pain.   Past Medical History  Diagnosis Date  . Thrombocytosis     Hem/Onc suggested 2/2 chronic hepatits and/or iron deficiency anemia  . Iron deficiency anemia   . N&V (nausea and vomiting)     and  Abdominal pain, chronic. Unclear ethiology with multiple admission and ED visits. Work up including Abdominal xray showed mostly nonspecific bowel gas pattern, CT abdomen with and without contrast (02/2011)  showed no acute process. Gastic Emptying scan (01/2010) was normal. Ultrasound of the abdomen was within normal limits. Hepatitis B viral load was undectable. HIV nonreactive. EGD (11/09/  . Abdominal pain   . Hypertension   . Hyperlipidemia   . Diabetes mellitus   . Recurrent boils   . Back pain   . OSA (obstructive sleep apnea)   . 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 Cath 12/10- no restenosis or significant CAD progression  . Irregular menses     Small ovarian follicles seen on  CT(9/06)  . History of pyelonephritis     H/o GrpB Pyelonephritis (9/06) and UTI- 07/11- E.Coli, 12/10- GBS  . Abscess of tunica vaginalis     10/09- Abundant S. aureus- sensitive to all abx  . Obesity   . Asthma   . Gastritis   . Depression   . Hepatitis B, chronic     Hep BeAb+,Hep B cAb+ & Hep BsAg+ (9/06)  . Peripheral neuropathy   . Fibromyalgia   . GERD (gastroesophageal reflux disease)   . MI (myocardial infarction) 2008  . Migraine   . Anxiety   . Urinary tract infection   . Gastroparesis     secondary to poorly controlled DM, last emptying study performed 01/2010  was normal but may be falsely positive as pt was on reglan      Past Surgical History  Procedure Date  . Cesarean section 1997    Family History  Problem Relation Age of Onset  . Diabetes Father    Social History:  reports that she quit smoking about 14 years ago. Her smoking use included Cigarettes. She does not have any smokeless tobacco history on file. She reports that she does not drink alcohol or use illicit drugs.  Allergies:  Allergies  Allergen Reactions  . Lisinopril Nausea Only     Medications Prior to Admission  Medication Sig Dispense Refill  . ADVAIR DISKUS 100-50 MCG/DOSE AEPB INHALE  ONE PUFF TWO TIMES A DAY  1 each  3  . aspirin 325 MG tablet Take 325 mg by mouth daily.       . clonazePAM (KLONOPIN) 0.5 MG tablet Take 0.5 mg by mouth 3 (three) times daily as needed. For anxiety.      . clopidogrel (PLAVIX) 75 MG tablet Take 75 mg by mouth every morning.       . ferrous gluconate (FERGON) 325 MG tablet Take 325 mg by mouth daily with breakfast.       . glucose blood test strip 1 each by Other route as needed. Use as instructed       . insulin glargine (LANTUS SOLOSTAR) 100 UNIT/ML injection Inject 50 Units into the skin at bedtime  10 mL  12  . Insulin Pen Needle ([place] PEN NEEDLES) 31G X 8 MM MISC by Does not apply route.        Marland Kitchen omeprazole (PRILOSEC) 40 MG capsule Take 40 mg by  mouth daily.       Marland Kitchen PRESCRIPTION MEDICATION Unknown blood pressure medication, patient states she was taking metoprolol but possibly changed. See notes       . PRODIGY LANCETS 28G MISC by Does not apply route. To inject Lantus as directed       . promethazine (PHENERGAN) 25 MG tablet Take 1 tablet (25 mg total) by mouth every 6 (six) hours as needed for nausea.  30 tablet  0    Results for orders placed in visit on 03/30/11 (from the past 48 hour(s))  GLUCOSE, CAPILLARY     Status: Abnormal   Collection Time   03/30/11  2:26 PM      Component Value Range Comment   Glucose-Capillary 357 (*) 70 - 99 (mg/dL)   COMPREHENSIVE METABOLIC PANEL     Status: Abnormal   Collection Time   03/30/11  4:45 PM      Component Value Range Comment   Sodium 137  135 - 145 (mEq/L)    Potassium 3.6  3.5 - 5.3 (mEq/L)    Chloride 100  96 - 112 (mEq/L)    CO2 25  19 - 32 (mEq/L)    Glucose, Bld 297 (*) 70 - 99 (mg/dL)    BUN 11  6 - 23 (mg/dL)    Creat 1.61  0.96 - 1.10 (mg/dL)    Total Bilirubin 0.2 (*) 0.3 - 1.2 (mg/dL)    Alkaline Phosphatase 73  39 - 117 (U/L)    AST 14  0 - 37 (U/L)    ALT 7  0 - 35 (U/L)    Total Protein 8.8 (*) 6.0 - 8.3 (g/dL)    Albumin 3.6  3.5 - 5.2 (g/dL)    Calcium 9.8  8.4 - 10.5 (mg/dL)   CBC WITH DIFFERENTIAL     Status: Abnormal   Collection Time   03/30/11  4:45 PM      Component Value Range Comment   WBC 13.3 (*) 4.0 - 10.5 (K/uL)    RBC 5.28 (*) 3.87 - 5.11 (MIL/uL)    Hemoglobin 10.4 (*) 12.0 - 15.0 (g/dL)    HCT 04.5 (*) 40.9 - 46.0 (%)    MCV 61.2 (*) 78.0 - 100.0 (fL)    MCH 19.7 (*) 26.0 - 34.0 (pg)    MCHC 32.2  30.0 - 36.0 (g/dL)    RDW 81.1 (*) 91.4 - 15.5 (%)    Platelets 447 (*) 150 - 400 (K/uL)    Neutrophils  Relative 73  43 - 77 (%)    Neutro Abs 9.7 (*) 1.7 - 7.7 (K/uL)    Lymphocytes Relative 21  12 - 46 (%)    Lymphs Abs 2.7  0.7 - 4.0 (K/uL)    Monocytes Relative 5  3 - 12 (%)    Monocytes Absolute 0.7  0.1 - 1.0 (K/uL)    Eosinophils  Relative 1  0 - 5 (%)    Eosinophils Absolute 0.1  0.0 - 0.7 (K/uL)    Basophils Relative 0  0 - 1 (%)    Basophils Absolute 0.0  0.0 - 0.1 (K/uL)    Smear Review See Note   Target cells   No results found.  Lab Results  Component Value Date   HGBA1C 11.0* 03/04/2011     Review of Systems  Unable to perform ROS   Last menstrual period 03/09/2011.  Wt Readings from Last 3 Encounters:  03/30/11 233 lb 8 oz (105.915 kg)  03/23/11 229 lb 11.2 oz (104.191 kg)  03/14/11 190 lb (86.183 kg)   Temp Readings from Last 3 Encounters:  03/30/11 97.8 F (36.6 C) Oral   BP Readings from Last 3 Encounters:  03/30/11 180/125  03/29/11 169/97  03/23/11 128/89   Pulse Readings from Last 3 Encounters:  03/30/11 112  03/29/11 97  03/23/11 98    Physical Exam  Constitutional: She appears well-developed.  HENT:  Head: Normocephalic.  Neck: Normal range of motion. Neck supple.  Cardiovascular: Normal heart sounds.        tachycardic  Respiratory: Effort normal and breath sounds normal.  GI: She exhibits no distension and no mass. There is no rebound.       Soft, exquisitely tender diffusely left upper and lower quadrant  Voluntary guarding present  Musculoskeletal: Normal range of motion.  Neurological: She is alert.     Assessment/Plan  # Abdominal pain with nausea/vomiting - Patient has been hospitalized several times over the past year for abdominal pain, nausea, and vomiting. These symptoms have been extensively worked up by GI with no clear etiology. Throughout her hospitalization, patient reported left sided abdominal pain not relieved by pain medication. As hospitalization progressed, we managed pain with tylenol only, as opioids would worsen gastroparesis and NSAIDs would worsen gastritis. She also vomited a handful of times in the clinic. Differential includes vasospasms causing worsening gastroparesis symptoms as she was cocaine positive during last admission, vs gastritis  vs diverticulitis (however, less likely given normal CT findings) vs atypical causes such as UTI, infection. Patient has baseline gastroparesis at baseline given uncontrolled DM, and she has been on reglan in the past. In the past, her gastric emptying study was thought to be falsely negative since she was on reglan, but she did not go to any of her GI follow up appointments to further assess problem. CT & US done in November 2012 showed no acute pathology except a left sided corpus luteal cyst, that should not cause the extent of pain that she reports, unless cyst ruptured. Also suspect depression/anxiety plays a component in patient's N/V. White is elevated, but appears stable since discharge. Looking back it appears that patient has a chronic leukocytosis.   Plan: -Admit for IV hydration and Phergan adminstration -No NSAIDs given prior hx of gastritis -Tylenol for pain control -avoid opiate medication in setting of gastroparesis -UDS to check for cocaine, hx of cocaine induced vasospasm  -monitor electrolytes  # Tachycardia - Per chart review, patient's baseline heart rate  is in the high 80s to low 90s. Heart rate today 112 may be secondary to dehydration vs pain. Suspect that there is a component of anxiety related. During the last admission pt was started on labetalol but because of lower SBPs, she was not discharged with labetalol.   Plan:  -monitor on tele  -check EKG -IV hydration  # Iron deficiency Anemia - Patient's baseline Hgb is 9-11; her Hgb within baseline at 10.4.She has history of iron deficiency anemia and is not compliant with supplementation. She likely also has a component of thalassemia. Chart review suggests Hemoglobin C, and electrophoresis in 2009, but no documentation of electrophoresis itself. Her fecal occult blood test on 03/09/11 was negative. Patient noted no blood in her stool or any obvious source of hemorrhage.  Plan: -Hold off on iron repletion given recent hx  of constipation and noncompliance -Monitor Hgb and Hct  # DM - Patient's HbA1c is 11, implying poor control with her current home regimen.  She was discharged with lantus 50u qHS but unsure of compliance.   Plan: -SSI (sensitive) -Lantus 30 units   # History of Bacterial Vaginosis - Patient was discharged with 2 days of flagyl therapy, to finish course from when she was last discharge. Unfortunately patient did not pick up her prescription due to cost of medication. Will give her one dose IV flagyl today to complete course. Patient currently asymptomatic in terms of discharge, odor, etc.   # Hypertension - Blood pressures were elevated throughout last admission, with the highest BPs in the 180s/130s which is what it is today. She was started on HCTZ and labetalol during last admission, and her blood pressures dropped to 100s-110s. Because her heart rates spiked, the HCTZ was initially discontinued, and later labetalol was discontinued as well.   Plan: -check UDS for cocaine which may be contributing to elevated pressure -Hydralazine prn, no beta blockers in setting of potential cocaine use -Continue to monitor  # Polysubstance abuse - Patient's UDS was positive for cocaine and marijuana during last admission, although the patient denies cocaine use. She was evaluated by psychiatry and social work during last admission, although she declined any further interventions. Will check UDS.  # Depression/Anxiety - Patient appears depressed, and no organic cause for her symptoms has been found. There is likely a somatization component to her current symptoms. Patient reports significant family stress, which is likely a large contributing factor to her psych and somatic symptoms. Psychiatry was consulted at last admission, and the patient does not wish to pursue treatment with antidepressants, but will continue her home dose of Klonopin.      Shirley [name] 03/30/2011, 6:07 PM

## 2011-03-30 NOTE — Progress Notes (Signed)
Pt's bp elevated 200/133 which was taken by the dinamap and 172/100 which was taken manually. Pt Hr is also elevated Sinus Tach. Pt's Hr will go up to the 130's non sustaining. Pt  Stated she feel's nauseated and is vomiting frequently.  Pt had a run of Vtach. Md made aware of pt condition. Md came up to see the pt. Will cont to monitor pt.

## 2011-03-30 NOTE — Progress Notes (Signed)
Attempted IV start x2 in left hand. Unsuccessful.  DSD applied

## 2011-03-30 NOTE — Progress Notes (Signed)
Subjective:   Patient ID: Shirley [name] female   DOB: 04/07/1976 35 y.o.   MRN: [id number]  HPI: [name] is a 35 y.o. woman who presents today for follow up from her ED visit yesterday.  She presented to the [facility] ED on 03/29/11 with a 1 day history of left lower quadrant pain similar to her usual flares of chronic abdominal pain.  She was given phenergan and 1 dose of 2 mg of morphine and was discharged home because she was better and was eating.  She states she went home and laid in bed all day.  This morning she hasn't eaten anything and around noon because nauseated and started retching.  She presented to clinic and continued to retch throughout her stay in clinic.  She states that she has not eaten since the sandwich in the ED.  She took 25 mg of phenergan this morning but threw it back up.  She states she had a bowel movement after straining this morning that was hard and small.  She denies fevers, chills, or chest pain.   Past Medical History  Diagnosis Date  . Thrombocytosis     Hem/Onc suggested 2/2 chronic hepatits and/or iron deficiency anemia  . Iron deficiency anemia   . N&V (nausea and vomiting)     and  Abdominal pain, chronic. Unclear ethiology with multiple admission and ED visits. Work up including Abdominal xray showed mostly nonspecific bowel gas pattern, CT abdomen with and without contrast (01/2010)  showed no acute process. Gastic Emptying scan (01/2010) was normal. Ultrasound of the abdomen was within normal limits. Hepatitis B viral load was undectable. HIV nonreactive. EGD (11/09/  . Abdominal pain   . Hypertension   . Hyperlipidemia   . Diabetes mellitus   . Recurrent boils   . Back pain   . OSA (obstructive sleep apnea)   . 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 Cath 12/10- no restenosis or significant CAD progression  . Irregular menses     Small  ovarian follicles seen on CT(9/06)  . History of pyelonephritis     H/o GrpB Pyelonephritis (9/06) and UTI- 07/11- E.Coli, 12/10- GBS  . Abscess of tunica vaginalis     10/09- Abundant S. aureus- sensitive to all abx  . Obesity   . Asthma   . Gastritis   . Depression   . Hepatitis B, chronic     Hep BeAb+,Hep B cAb+ & Hep BsAg+ (9/06)  . Peripheral neuropathy   . Fibromyalgia   . GERD (gastroesophageal reflux disease)   . MI (myocardial infarction) 2008  . Migraine   . Anxiety   . Urinary tract infection    Current Outpatient Prescriptions  Medication Sig Dispense Refill  . ADVAIR DISKUS 100-50 MCG/DOSE AEPB INHALE ONE PUFF TWO TIMES A DAY  1 each  3  . aspirin 325 MG tablet Take 325 mg by mouth daily.       . clonazePAM (KLONOPIN) 0.5 MG tablet Take 0.5 mg by mouth 3 (three) times daily as needed. For anxiety.      . clopidogrel (PLAVIX) 75 MG tablet Take 75 mg by mouth every morning.       . ferrous gluconate (FERGON) 325 MG tablet Take 325 mg by mouth daily with breakfast.       . glucose blood test strip 1 each by Other route as  needed. Use as instructed       . insulin glargine (LANTUS SOLOSTAR) 100 UNIT/ML injection Inject 50 Units into the skin at bedtime  10 mL  12  . Insulin Pen Needle (AURORA PEN NEEDLES) 31G X 8 MM MISC by Does not apply route.        Marland Kitchen omeprazole (PRILOSEC) 40 MG capsule Take 40 mg by mouth daily.       Marland Kitchen PRESCRIPTION MEDICATION Unknown blood pressure medication, patient states she was taking metoprolol but possibly changed. See notes       . PRODIGY LANCETS 28G MISC by Does not apply route. To inject Lantus as directed       . promethazine (PHENERGAN) 25 MG tablet Take 1 tablet (25 mg total) by mouth every 6 (six) hours as needed for nausea.  30 tablet  0   No current facility-administered medications for this visit.   Facility-Administered Medications Ordered in Other Visits  Medication Dose Route Frequency Provider Last Rate Last Dose  . DISCONTD:  promethazine (PHENERGAN) tablet 25 mg  25 mg Oral Q4H PRN Quentin Ore, MD   25 mg at 03/29/11 1145   Family History  Problem Relation Age of Onset  . Diabetes Father    History   Social History  . Marital Status: Single    Spouse Name: N/A    Number of Children: N/A  . Years of Education: N/A   Occupational History  . other     unemployed   Social History Main Topics  . Smoking status: Former Smoker    Types: Cigarettes    Quit date: 04/24/1996  . Smokeless tobacco: None  . Alcohol Use: No  . Drug Use: No  . Sexually Active: None   Other Topics Concern  . 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   Review of Systems: Constitutional: Denies fever, chills, diaphoresis, appetite change and fatigue.  HEENT: Denies photophobia, eye pain, redness, hearing loss, ear pain, congestion, sore throat, rhinorrhea, sneezing, mouth sores, trouble swallowing, neck pain, neck stiffness and tinnitus.   Respiratory: Denies SOB, DOE, cough, chest tightness,  and wheezing.   Cardiovascular: Denies chest pain, palpitations and leg swelling.  Gastrointestinal: Positive for nausea, vomiting, abdominal pain.  Denies diarrhea, constipation, blood in stool and abdominal distention.  Genitourinary: Denies dysuria, urgency, frequency, hematuria, flank pain and difficulty urinating.  Musculoskeletal: Denies myalgias, back pain, joint swelling, arthralgias and gait problem.  Skin: Denies pallor, rash and wound.  Neurological: Denies dizziness, seizures, syncope, weakness, light-headedness, numbness and headaches.  Hematological: Denies adenopathy. Easy bruising, personal or family bleeding history  Psychiatric/Behavioral: Denies suicidal ideation, mood changes, confusion, nervousness, sleep disturbance and agitation  Objective:  Physical Exam: Filed Vitals:    03/30/11 1428  BP: 180/125  Pulse: 112  Temp: 97.8 F (36.6 C)  TempSrc: Oral  Height: 5\' 7"  (1.702 m)  Weight: 233 lb 8 oz (105.915 kg)   Constitutional: Vital signs reviewed.  Patient is an anxious, obese woman in mild distress and partially cooperative with exam. Alert and oriented x3.  Head: Normocephalic and atraumatic Ear: TM normal bilaterally Mouth: no erythema or exudates, MM mildly dry Eyes: PERRL, EOMI, conjunctivae normal, No scleral icterus.  Neck: Supple, Trachea midline normal ROM, No JVD, mass, thyromegaly, or carotid bruit present.  Cardiovascular: tachycardic but regular rhythm, S1 normal, S2  normal, no MRG, pulses symmetric and intact bilaterally Pulmonary/Chest: CTAB, no wheezes, rales, or rhonchi Abdominal: Soft. Moderate tenderness to palpation in the LLQ and LUQ that was not present with use of the stethoscope, non-distended, bowel sounds are hyperactive, no masses, organomegaly, or guarding present.  GU: no CVA tenderness Hematology: no cervical, inginal, or axillary adenopathy.  Skin: normal skin turgor, dry skin,  No rash, cyanosis, or clubbing.  Psychiatric: Anxious mood and affect. speech and behavior is normal. Judgment and thought content normal. Cognition and memory are normal.   Assessment & Plan:

## 2011-03-30 NOTE — Progress Notes (Signed)
Notified MD of patient' blood pressure,MD to review.Report given to RN on 3700.

## 2011-03-30 NOTE — Assessment & Plan Note (Signed)
She has continued nausea and vomiting even after the ED visit today.  We will get an IV placed and give her IV fluids and some IV phenergan and observe.  Update: Unable to get IV placed after 2 hours.  Because of the end of the clinic day we will work to admit for observation status with IV fluids and IV anti nausea medication.

## 2011-03-30 NOTE — Assessment & Plan Note (Signed)
Unsure of the cause of her chronic abdominal pain.  We will get a STAT CBC with diff, Cmet, UA, and UDS.  With her admission they could also consider Lipase but unlikely given presentation.

## 2011-03-30 NOTE — Progress Notes (Signed)
Pt unwilling to answer questions upon admission. She is lying on her stomach, refusing to turn around for assessment.

## 2011-03-30 NOTE — Progress Notes (Signed)
Pt continues to gag and produce approx. 10 - 30 cc clear to yellow clear fluid until 5:40P. IV attempts x 2 at approx. 4P by Dorie Rank, RN.

## 2011-03-31 ENCOUNTER — Encounter (HOSPITAL_COMMUNITY): Payer: Self-pay | Admitting: General Practice

## 2011-03-31 DIAGNOSIS — R1032 Left lower quadrant pain: Secondary | ICD-10-CM

## 2011-03-31 LAB — RAPID URINE DRUG SCREEN, HOSP PERFORMED
Amphetamines: NOT DETECTED
Benzodiazepines: NOT DETECTED
Tetrahydrocannabinol: POSITIVE — AB

## 2011-03-31 LAB — GLUCOSE, CAPILLARY: Glucose-Capillary: 180 mg/dL — ABNORMAL HIGH (ref 70–99)

## 2011-03-31 MED ORDER — LABETALOL HCL 5 MG/ML IV SOLN
5.0000 mg | Freq: Once | INTRAVENOUS | Status: DC
  Administered 2011-03-31: 5 mg via INTRAVENOUS
  Filled 2011-03-31: qty 1

## 2011-03-31 MED ORDER — METOCLOPRAMIDE HCL 10 MG PO TABS
10.0000 mg | ORAL_TABLET | Freq: Three times a day (TID) | ORAL | Status: DC
  Administered 2011-03-31 – 2011-04-01 (×3): 10 mg via ORAL
  Filled 2011-03-31 (×6): qty 1

## 2011-03-31 MED ORDER — AMLODIPINE BESYLATE 5 MG PO TABS: 5.0000 mg | ORAL_TABLET | Freq: Every day | ORAL | Status: DC

## 2011-03-31 MED ORDER — LABETALOL HCL 5 MG/ML IV SOLN: 20.0000 mg | Freq: Once | INTRAVENOUS | Status: DC

## 2011-03-31 MED ORDER — LABETALOL HCL 5 MG/ML IV SOLN
20.0000 mg | Freq: Once | INTRAVENOUS | Status: AC
  Administered 2011-03-31: 20 mg via INTRAVENOUS
  Filled 2011-03-31 (×2): qty 4

## 2011-03-31 MED ORDER — LABETALOL HCL 5 MG/ML IV SOLN
5.0000 mg | Freq: Once | INTRAVENOUS | Status: AC
  Administered 2011-03-31: 5 mg via INTRAVENOUS

## 2011-03-31 MED ORDER — LABETALOL HCL 5 MG/ML IV SOLN
15.0000 mg | Freq: Once | INTRAVENOUS | Status: AC
  Administered 2011-03-31: 15 mg via INTRAVENOUS
  Filled 2011-03-31: qty 4

## 2011-03-31 MED ORDER — HYDRALAZINE HCL 20 MG/ML IJ SOLN
10.0000 mg | Freq: Once | INTRAMUSCULAR | Status: AC
  Administered 2011-03-31: 10 mg via INTRAVENOUS
  Filled 2011-03-31: qty 1

## 2011-03-31 MED ORDER — INSULIN GLARGINE 100 UNIT/ML ~~LOC~~ SOLN
40.0000 [IU] | Freq: Every day | SUBCUTANEOUS | Status: DC
  Administered 2011-03-31: 40 [IU] via SUBCUTANEOUS

## 2011-03-31 MED ORDER — KETOROLAC TROMETHAMINE 30 MG/ML IJ SOLN
30.0000 mg | Freq: Four times a day (QID) | INTRAMUSCULAR | Status: DC | PRN
  Administered 2011-03-31 – 2011-04-01 (×2): 30 mg via INTRAVENOUS
  Filled 2011-03-31 (×2): qty 1

## 2011-03-31 MED ORDER — CLONAZEPAM 0.5 MG PO TABS
0.5000 mg | ORAL_TABLET | Freq: Three times a day (TID) | ORAL | Status: DC | PRN
  Administered 2011-03-31 – 2011-04-01 (×3): 0.5 mg via ORAL
  Filled 2011-03-31 (×3): qty 1

## 2011-03-31 MED ORDER — ONDANSETRON HCL 4 MG/2ML IJ SOLN
4.0000 mg | Freq: Four times a day (QID) | INTRAMUSCULAR | Status: DC | PRN
  Administered 2011-03-31 – 2011-04-01 (×2): 4 mg via INTRAVENOUS
  Filled 2011-03-31 (×3): qty 2

## 2011-03-31 NOTE — Progress Notes (Signed)
Pt's bp 180/104, rechecked manually which was the same thing. Dr Maisie Fus paged to be made aware, awaiting return of call

## 2011-03-31 NOTE — Progress Notes (Signed)
Dr. Maisie Fus returned call, orders received. Will cont to monitor

## 2011-03-31 NOTE — Progress Notes (Signed)
Subjective: Patient complains of pain LLQ and continued nausea, vomiting. She reports that vomiting began 1 year ago. She doesn't know any factors that correlate with her episodes including eating, her menstrual cycle, time of day etc.  Objective: Vital signs in last 24 hours: Filed Vitals:   03/30/11 2148 03/31/11 0407 03/31/11 0500 03/31/11 1400  BP: 172/100 168/87 163/91 180/104  Pulse:   102 119  Temp:   98.6 F (37 C) 98 F (36.7 C)  TempSrc:    Oral  Resp:   22 18  SpO2:   98% 100%   Weight change:   Intake/Output Summary (Last 24 hours) at 03/31/11 1704 Last data filed at 03/30/11 2000  Gross per 24 hour  Intake      0 ml  Output      1 ml  Net     -1 ml   Physical Exam: General: middle aged woman resting in bed, difficult to get her to wake up and speak with me HEENT: PERRL, EOMI, no scleral icterus Cardiac: RRR, no rubs, murmurs or gallops Pulm: clear to auscultation bilaterally, moving normal volumes of air Abd: soft, tender in 4 inch by 3 inch area of LLQ, nondistended, BS present Ext: warm and well perfused, no pedal edema Neuro: alert and oriented X3, cranial nerves II-XII grossly intact  Lab Results: Basic Metabolic Panel:  Lab 03/30/11 6213 03/29/11 1056  NA 137 132*  K 3.6 4.0  CL 100 100  CO2 25 23  GLUCOSE 297* 306*  BUN 11 11  CREATININE 0.63 0.61  CALCIUM 9.8 9.3  MG -- --  PHOS -- --   Liver Function Tests:  Lab 03/30/11 1645  AST 14  ALT 7  ALKPHOS 73  BILITOT 0.2*  PROT 8.8*  ALBUMIN 3.6   No results found for this basename: LIPASE:2,AMYLASE:2 in the last 168 hours No results found for this basename: AMMONIA:2 in the last 168 hours CBC:  Lab 03/30/11 1645  WBC 13.3*  NEUTROABS 9.7*  HGB 10.4*  HCT 32.3*  MCV 61.2*  PLT 447*   Cardiac Enzymes: No results found for this basename: CKTOTAL:3,CKMB:3,CKMBINDEX:3,TROPONINI:3 in the last 168 hours BNP: No results found for this basename: POCBNP:3 in the last 168  hours D-Dimer: No results found for this basename: DDIMER:2 in the last 168 hours CBG:  Lab 03/31/11 1134 03/31/11 0731 03/30/11 2150 03/30/11 1426 03/29/11 1446 03/29/11 0947  GLUCAP 233* 267* 255* 357* 321* 353*   Drugs of Abuse     Component Value Date/Time   LABOPIA NONE DETECTED 03/30/2011 2250   LABOPIA NEGATIVE 02/21/2010 2201   COCAINSCRNUR NONE DETECTED 03/30/2011 2250   COCAINSCRNUR NEGATIVE 02/21/2010 2201   LABBENZ NONE DETECTED 03/30/2011 2250   LABBENZ NEGATIVE 02/21/2010 2201   AMPHETMU NONE DETECTED 03/30/2011 2250   AMPHETMU NEGATIVE 02/21/2010 2201   THCU POSITIVE* 03/30/2011 2250   LABBARB NONE DETECTED 03/30/2011 2250    Continuous Infusions:   . sodium chloride 100 mL/hr at 03/31/11 1226  . DISCONTD: sodium chloride 125 mL/hr at 03/30/11 1835   PRN Meds:.acetaminophen, clonazePAM, ketorolac, ondansetron (ZOFRAN) IV, promethazine, zolpidem, DISCONTD: acetaminophen, DISCONTD: LORazepam, DISCONTD: promethazine Assessment/Plan: # Abdominal pain with nausea/vomiting - Patient has been hospitalized several times over the past year for abdominal pain, nausea, and vomiting. These symptoms have been extensively worked up by GI with no clear etiology. Throughout her hospitalization, patient reported left sided abdominal pain not relieved by pain medication. As hospitalization progressed, we managed pain with tylenol only,  as opioids would worsen gastroparesis and NSAIDs would worsen gastritis. She also vomited a handful of times in the clinic. Differential includes vasospasms causing worsening gastroparesis symptoms as she was cocaine positive during last admission, vs gastritis vs diverticulitis (however, less likely given normal CT findings) vs atypical causes such as UTI, infection. Patient has baseline gastroparesis at baseline given uncontrolled DM, and she has been on reglan in the past. In the past, her gastric emptying study was thought to be falsely negative since she was  on reglan, but she did not go to any of her GI follow up appointments to further assess problem. CT & US done in November 2012 showed no acute pathology except a left sided corpus luteal cyst, that should not cause the extent of pain that she reports, unless cyst ruptured. Also suspect depression/anxiety plays a component in patient's N/V.  Plan:  -Admit for IV hydration and Phergan, zofran, reglan adminstration  -No NSAIDs given prior hx of gastritis  -Tylenol for pain control  -avoid opiate medication in setting of gastroparesis  -get patient on outpatient reglan again, she feels it does not work  # Tachycardia - Per chart review, patient's baseline heart rate is in the high 80s to low 90s. Heart rate today 112 may be secondary to dehydration vs pain. Suspect that there is a component of anxiety related. During the last admission pt was started on labetalol but because of lower SBPs, she was not discharged with labetalol.  Plan:  -monitor on tele  -IV hydration   # Iron deficiency Anemia - Patient's baseline Hgb is 9-11; her Hgb within baseline at 10.4.She has history of iron deficiency anemia and is not compliant with supplementation. She likely also has a component of thalassemia. Chart review suggests Hemoglobin C, and electrophoresis in 2009, but no documentation of electrophoresis itself. Her fecal occult blood test on 03/09/11 was negative. Patient noted no blood in her stool or any obvious source of hemorrhage.  Plan:  -Hold off on iron repletion given recent hx of constipation and noncompliance  -Monitor Hgb and Hct   # DM - Patient's HbA1c is 11, implying poor control with her current home regimen. She was discharged with lantus 50u qHS but unsure of compliance.  Plan:  -SSI (sensitive)  -Lantus increased to 40 units   # History of Bacterial Vaginosis - Patient was discharged with 2 days of flagyl therapy, to finish course from when she was last discharge. Unfortunately patient did  not pick up her prescription due to cost of medication. Will give her one dose IV flagyl today to complete course. Patient currently asymptomatic in terms of discharge, odor, etc.   # Hypertension - Blood pressures were elevated throughout last admission, with the highest BPs in the 180s/130s which is what it is today. She was started on HCTZ and labetalol during last admission, and her blood pressures dropped to 100s-110s. Because her heart rates spiked, the HCTZ was initially discontinued, and later labetalol was discontinued as well.  Plan:  -Hydralazine prn, she has gotten two doses of labetolol as one-time doses -started norvasc for tomorrow given concern for cocaine use as an outpatient  # Polysubstance abuse - Patient's UDS was positive for cocaine and marijuana during last admission, although the patient denies cocaine use. Positive for marijuana this admission. She was evaluated by psychiatry and social work during last admission, although she declined any further interventions.   # Depression/Anxiety - Patient appears depressed, and no organic cause for her  symptoms has been found. There is likely a somatization component to her current symptoms. Patient reports significant family stress, which is likely a large contributing factor to her psych and somatic symptoms. Psychiatry was consulted at last admission, and the patient does not wish to pursue treatment with antidepressants, but will continue her home dose of Klonopin.    LOS: 1 day   Shirley [name] 03/31/2011, 5:04 PM

## 2011-03-31 NOTE — Progress Notes (Signed)
Utilization review complete 

## 2011-03-31 NOTE — Progress Notes (Signed)
Pt refuses to answer questions regarding admission hx at this time.  [name], RN notified. Shirley [name]

## 2011-03-31 NOTE — H&P (Signed)
Internal Medicine Teaching Service Attending Note Date: 03/31/2011  Patient name: Shirley [name]  Medical record number: [id number]  Date of birth: 02/19/1976   I have seen and evaluated Shirley [name] and discussed their care with the Residency Team.  Unfortunate situation, patient with multiple ED visits and admissions; negative exhaustive GI evaluation, lack of follow up with PCP-Dr. Bosie Clos, or GI out-patient.  Physical Exam: Blood pressure 163/91, pulse 102, temperature 98.6 F (37 C), temperature source Oral, resp. rate 22, last menstrual period 03/09/2011, SpO2 98.00%. Patient quiet as I entered room, then began rithing in pain, curled up in ball. Lungs- clear Heart-RRR Abdom-+BS, moderate diffuse tenderness, BUT it is distractable, no rebound or guarding to my exam extrem-no edema Neuro-non-focal Psych-appears anxious and mildly depressed. Denies suicidal ideation or precipitating events   Lab results: Results for orders placed during the hospital encounter of 03/30/11 (from the past 24 hour(s))  GLUCOSE, CAPILLARY     Status: Abnormal   Collection Time   03/30/11  9:50 PM      Component Value Range   Glucose-Capillary 255 (*) 70 - 99 (mg/dL)  URINALYSIS, ROUTINE W REFLEX MICROSCOPIC     Status: Abnormal   Collection Time   03/30/11 10:50 PM      Component Value Range   Color, Urine YELLOW  YELLOW    APPearance CLOUDY (*) CLEAR    Specific Gravity, Urine 1.027  1.005 - 1.030    pH 7.0  5.0 - 8.0    Glucose, UA >1000 (*) NEGATIVE (mg/dL)   Hgb urine dipstick NEGATIVE  NEGATIVE    Bilirubin Urine NEGATIVE  NEGATIVE    Ketones, ur 40 (*) NEGATIVE (mg/dL)   Protein, ur NEGATIVE  NEGATIVE (mg/dL)   Urobilinogen, UA 0.2  0.0 - 1.0 (mg/dL)   Nitrite NEGATIVE  NEGATIVE    Leukocytes, UA TRACE (*) NEGATIVE   URINE RAPID DRUG SCREEN (HOSP PERFORMED)     Status: Abnormal   Collection Time   03/30/11 10:50 PM      Component Value Range   Opiates NONE DETECTED  NONE DETECTED     Cocaine NONE DETECTED  NONE DETECTED    Benzodiazepines NONE DETECTED  NONE DETECTED    Amphetamines NONE DETECTED  NONE DETECTED    Tetrahydrocannabinol POSITIVE (*) NONE DETECTED    Barbiturates NONE DETECTED  NONE DETECTED   URINE MICROSCOPIC-ADD ON     Status: Normal   Collection Time   03/30/11 10:50 PM      Component Value Range   Squamous Epithelial / LPF RARE  RARE    WBC, UA 0-2  <3 (WBC/hpf)   RBC / HPF 0-2  <3 (RBC/hpf)   Bacteria, UA RARE  RARE   GLUCOSE, CAPILLARY     Status: Abnormal   Collection Time   03/31/11  7:31 AM      Component Value Range   Glucose-Capillary 267 (*) 70 - 99 (mg/dL)   Comment 1 Notify RN    GLUCOSE, CAPILLARY     Status: Abnormal   Collection Time   03/31/11 11:34 AM      Component Value Range   Glucose-Capillary 233 (*) 70 - 99 (mg/dL)   Comment 1 Notify RN      Imaging results:  No results found.  Assessment and Plan: I agree with the formulated Assessment and Plan with the following changes:  I have spoken with patient at length. At this point I don't think that she needs inpatient treatment.  I do think that she needs to establish with her PCP, follow up with outpatient GI, and stay off of opioids. We can discharge her on po or PR phenergan. I believe that she does have gastroparesis and therefore restarting her on reglan will be helpful. Her UDS is positive for THC and not cocaine this time. I wish there was more we could do to help but I believe there is a somatization component to this making treatment much more difficult.

## 2011-03-31 NOTE — Progress Notes (Signed)
Inpatient Diabetes Program Recommendations  AACE/ADA: New Consensus Statement on Inpatient Glycemic Control (2009)  Target Ranges:  Prepandial:   less than 140 mg/dL      Peak postprandial:   less than 180 mg/dL (1-2 hours)      Critically ill patients:  140 - 180 mg/dL   CBGs 81/19: 147/ 829 mg/dl CBGs today: 562/ 130 mg/dl  Inpatient Diabetes Program Recommendations Insulin - Basal: Titrate Lantus upward (home dose 50 units QHS)  Poor control at home.  Last HgbA1C was 11.0% (03/04/11).  Pt may benefit from the addition of rapid-acting insulin to home regimen to cover meals, etc.

## 2011-04-01 DIAGNOSIS — R1032 Left lower quadrant pain: Secondary | ICD-10-CM

## 2011-04-01 LAB — BASIC METABOLIC PANEL
CO2: 23 mEq/L (ref 19–32)
Calcium: 9.2 mg/dL (ref 8.4–10.5)
Chloride: 102 mEq/L (ref 96–112)
Glucose, Bld: 126 mg/dL — ABNORMAL HIGH (ref 70–99)
Potassium: 3.7 mEq/L (ref 3.5–5.1)
Sodium: 136 mEq/L (ref 135–145)

## 2011-04-01 LAB — GLUCOSE, CAPILLARY
Glucose-Capillary: 125 mg/dL — ABNORMAL HIGH (ref 70–99)
Glucose-Capillary: 142 mg/dL — ABNORMAL HIGH (ref 70–99)

## 2011-04-01 MED ORDER — LABETALOL HCL 100 MG PO TABS: 50.0000 mg | ORAL_TABLET | Freq: Two times a day (BID) | ORAL | Status: DC

## 2011-04-01 MED ORDER — HYDRALAZINE HCL 20 MG/ML IJ SOLN
INTRAMUSCULAR | Status: AC
  Administered 2011-04-01: 10 mg via INTRAVENOUS
  Filled 2011-04-01: qty 1

## 2011-04-01 MED ORDER — AMLODIPINE BESYLATE 5 MG PO TABS
5.0000 mg | ORAL_TABLET | Freq: Once | ORAL | Status: AC
  Administered 2011-04-01: 5 mg via ORAL
  Filled 2011-04-01: qty 1

## 2011-04-01 MED ORDER — INSULIN GLARGINE 100 UNIT/ML ~~LOC~~ SOLN: 40.0000 [IU] | Freq: Every day | SUBCUTANEOUS | Status: DC

## 2011-04-01 MED ORDER — METOCLOPRAMIDE HCL 10 MG PO TABS: 10.0000 mg | ORAL_TABLET | Freq: Three times a day (TID) | ORAL | Status: DC

## 2011-04-01 MED ORDER — DSS 100 MG PO CAPS: 100.0000 mg | ORAL_CAPSULE | Freq: Two times a day (BID) | ORAL | Status: AC | PRN

## 2011-04-01 MED ORDER — METOPROLOL TARTRATE 1 MG/ML IV SOLN
5.0000 mg | Freq: Four times a day (QID) | INTRAVENOUS | Status: DC
  Administered 2011-04-01 (×3): 5 mg via INTRAVENOUS
  Filled 2011-04-01 (×6): qty 5

## 2011-04-01 MED ORDER — PROMETHAZINE HCL 25 MG PO TABS: 25.0000 mg | ORAL_TABLET | Freq: Four times a day (QID) | ORAL | Status: DC | PRN

## 2011-04-01 MED ORDER — HYDRALAZINE HCL 20 MG/ML IJ SOLN
10.0000 mg | INTRAMUSCULAR | Status: DC | PRN
  Administered 2011-04-01: 10 mg via INTRAVENOUS

## 2011-04-01 NOTE — Assessment & Plan Note (Signed)
This is a chronic problem from this patient. As detailed in my HPI, she has had extensive workup for her chronic LLQ abdominal pain, which has been unremarkable. Thought to be contributed by her gastroparesis - she does not stay on her reglan therapy despite recommendation, because feels it is not abundantly helpful. There seems to be at least some contribution of somatization.  No weight changes or electrolyte abnormalities to suggest nutritional deficits. She has a history of cocaine and marijuana abuse, therefore, will not pursue narcotics. She has a hx of gastritis per EGD in 2009, so will not pursue NSAIDS.  Continue PPI.  Consider further GI outpatient evaluation if persistent.  Work on psychosocial issues that are contributing.

## 2011-04-01 NOTE — Progress Notes (Signed)
Subjective:    There were events overnight - BPs remained elevated therefore, hydralazine administered. Currently, the patient confirms symptoms of nausea. She denies symptoms of chest pressure/discomfort, orthopnea, palpitations and paroxysmal nocturnal dyspnea, chills, fevers.    Objective:    Vital Signs:   Temp:  [98 F (36.7 C)-98.7 F (37.1 C)] 98.4 F (36.9 C) (12/08 0500) Pulse Rate:  [112-126] 112  (12/08 0500) Resp:  [18-22] 22  (12/08 0500) BP: (148-185)/(100-131) 157/103 mmHg (12/08 0500) SpO2:  [98 %-100 %] 98 % (12/08 0500) Weight:  [225 lb 8.5 oz (102.3 kg)] 225 lb 8.5 oz (102.3 kg) (12/08 0700) Last BM Date:  (pt did not answer)   Physical Exam: General: Vital signs reviewed and noted. Well-developed, well-nourished, in no acute distress; alert, appropriate and cooperative throughout examination.  Lungs:  Normal respiratory effort. Clear to auscultation BL without crackles or wheezes.  Heart: RRR. S1 and S2 normal without gallop, murmur, or rubs.  Abdomen:  BS normoactive. Soft, Nondistended, non-tender.  No masses or organomegaly.  Extremities: No pretibial edema.     Labs:  Basic Metabolic Panel:  Lab 04/01/11 2440 03/30/11 1645 03/29/11 1056  NA 136 137 132*  K 3.7 3.6 4.0  CL 102 100 100  CO2 23 25 23   GLUCOSE 126* 297* 306*  BUN 9 11 11   CREATININE 0.49* 0.63 0.61  CALCIUM 9.2 9.8 9.3  MG -- -- --  PHOS -- -- --    Liver Function Tests:  Lab 03/30/11 1645  AST 14  ALT 7  ALKPHOS 73  BILITOT 0.2*  PROT 8.8*  ALBUMIN 3.6    CBC:  Lab 03/30/11 1645  WBC 13.3*  NEUTROABS 9.7*  HGB 10.4*  HCT 32.3*  MCV 61.2*  PLT 447*    CBG:  Lab 03/31/11 2102 03/31/11 1705 03/31/11 1134 03/31/11 0731 03/30/11 2150  GLUCAP 180* 192* 233* 267* 255*    Microbiology:  WET PREP, GENITAL                        Status: Abnormal   Clue Cells, Wet Prep MANY (*) NONE SEEN  Final    WBC, Wet Prep HPF POC FEW (*) NONE SEEN  Final    CULTURE,  BLOOD (ROUTINE X 2)     Status: Normal   CULTURE, BLOOD (ROUTINE X 2)     Status: Normal   CLOSTRIDIUM DIFFICILE BY PCR     Status: Normal      Medications:    Infusions:    . sodium chloride 10 mL/hr at 04/01/11 1027    Scheduled Medications:    . amLODipine  5 mg Oral Once  . docusate sodium  100 mg Oral BID  . enoxaparin  40 mg Subcutaneous Q24H  . Fluticasone-Salmeterol  1 puff Inhalation BID  . hydrALAZINE  10 mg Intravenous Once  . insulin aspart  0-9 Units Subcutaneous TID WC  . insulin glargine  40 Units Subcutaneous QHS  . labetalol  15 mg Intravenous Once  . labetalol  5 mg Intravenous Once  . metoCLOPramide  10 mg Oral TID AC  . metoprolol  5 mg Intravenous Q6H  . pantoprazole (PROTONIX) IV  40 mg Intravenous Q24H  . DISCONTD: amLODipine  5 mg Oral Daily  . DISCONTD: insulin glargine  30 Units Subcutaneous QHS  . DISCONTD: labetalol  20 mg Intravenous Once  . DISCONTD: labetalol  5 mg Intravenous Once    PRN Medications: acetaminophen,  clonazePAM, hydrALAZINE, ketorolac, ondansetron (ZOFRAN) IV, promethazine, zolpidem, DISCONTD: ketorolac, DISCONTD: LORazepam   Assessment/ Plan:    Pt is a 35 y.o. yo female with a PMHx of chronic abdominal pain (with extensive workup without clear etiology) who was admitted on 03/30/2011 with symptoms of nausea/ abdominal pain, which again is of unclear etiology, thought likely contributed by her untreated gastroparesis in the setting of poorly controlled diabetes. Interventions at this time will be focused on supportive care, with antiemetics, promotility agents, and tylenol for pain (with known recent cocaine positive status).    Abdominal pain with nausea/vomiting - Unclear etiology, at least partly contributed by her gastroparesis (not on home tx). She did have a gastric emptying study in 2011 that was notably negative on Reglan. This may suggest improvement of gastric motility with this agent, with expected improvement of  symptoms. Therefore, it has been restarted during this hospital course. Also, her symptoms seem at least partly also contributed by somatization, no lab evidence to suggest volume depletion. Will need further outpatient GI follow-up.  No NSAIDs given prior hx of gastritis   Tylenol for pain control   Avoid opiate medication in setting of gastroparesis  Reglan, zofran, phenergan for supportive measures.   Microcytic anemia - at her baseline, no evidence of acute bleed. BL Hgb is 9-11. 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 Chart review suggests Hemoglobin C, and electrophoresis in 2009, but no documentation of electrophoresis itself.   Continue to stress importance of home Fe supplementation.   DMII, A1c 11 - started on reduced dose lantus in setting of decreased oral intake, however, CBG in 200-300s on 12/06, therefore escalated to Lantus 40 with better control.  Continue Lantus   History of Bacterial Vaginosis - Did not complete her outpatient Flagyl therapy --> therefore was administered IV flagyl in patient.Currently asymptomatic in terms of discharge, odor, etc.   Completed therapy.   Hypertension - BPs remain elevated. Limited options in terms of BB given her cocaine abuse. However, Labetalol will likely be helpful with her tachycardia, hypertension, and will be ok with cocaine abuse (although discontinuing the cocaine is obviously the preferred choice).  Will start low dose labetalol.   Polysubstance abuse - Hx of cocaine (last positive UDS 02/2011) and marijuana use (current positive UDS). Refuses social work/ psych help with cessation.     Depression/Anxiety - Pt resistant to discussion (inpatient and outpatient) and initiation of antidepressant although seems clearly depressed. States anxiety controlled on Klonopin. Will need further  discussion as an outpatient.    Length of Stay: 2   Johnette Abraham, Norman Herrlich, Internal Medicine Resident 04/01/2011, 7:26 AM

## 2011-04-01 NOTE — Progress Notes (Signed)
Pt's BP elevated through out the night. Md was updated through out the night and new orders received.Pt's last bp came down to 157/103. Will cont to monitor pt and her BP.

## 2011-04-01 NOTE — Discharge Summary (Signed)
Patient Name:  Shirley [name]  MRN: [id number]  PCP: Kristie Cowman, MD  DOB:  07-04-1975       Date of Admission:  03/30/2011  Date of Discharge:  04/01/2011      Attending Physician: Dr. Tilford Pillar         DISCHARGE DIAGNOSES: 1. Abdominal pain with nausea/vomiting - unclear etiology, at least partly contributed by her gastroparesis (not on home tx). Also concern for somatization. EGD 02/2010 - mild gastritis. Consideration for further GI workup if needed as an outpatient. Has been restarted on Reglan. 2. 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  3. DMII, A1c 11 - reduced dose of home Lantus in setting of decreased oral intake. 4. History of Bacterial Vaginosis - tx with IV Flagyl inpatient 5. Hypertension - started on Labetalol. 6. Polysubstance abuse - Hx of cocaine (last positive UDS 02/2011) and marijuana use (current positive UDS).  7. Depression/Anxiety 8. CAD - s/p PCI of the proximal posterior descending artery upper and lower branch (05/2006)    DISPOSITION AND FOLLOW-UP: Shirley [name] is to follow-up with the listed providers as detailed below, at which time, the following issues should be addressed: 1) Chronic abdominal pain should be addressed with consideration to get her reestablished with GI as an outpatient to further workup her chronic abdominal pain.  2) Blood pressure control - as she was started on Labetalol, low dose at time of discharge.  3) DMII - her home Lantus was decreased to 40 units daily as she had good blood sugar control on this reduced dose. Question of compliance given reasonable control on this regimen.   Discharge Orders    Future Appointments: Provider: Department: Dept Phone: Center:   04/05/2011 3:15 PM Leod[facility] S[facility] [phone] [facility]     Future Orders  Please Complete By Expires   Diet - low sodium heart healthy      Increase activity slowly      Discharge instructions      Comments:   Please follow-up in the clinic on 04/05/11 at 3:15 PM, at which time we will have to check your nausea, abdominal pain and can get you set up with your GI doctor so you can go back to see him. Please take all medications as prescribed.  If you have worsening of your symptoms or new symptoms arise, please call the clinic [phone]), or go to the ER immediately if symptoms are severe.    Call MD for:  temperature >100.4      Call MD for:  persistant dizziness or light-headedness      Call MD for:  difficulty breathing, headache or visual disturbances         DISCHARGE MEDICATIONS: START taking these medications   Details  docusate sodium 100 MG CAPS Take 100 mg by mouth 2 (two) times daily as needed for constipation., Starting 04/01/2011, Until Tue 04/11/11, Normal    labetalol (NORMODYNE) 100 MG tablet Take 0.5 tablets (50 mg total) by mouth 2 (two) times daily., Starting 04/01/2011, Until Sun 03/31/12, Normal    metoCLOPramide (REGLAN) 10 MG tablet Take 1 tablet (10 mg total) by mouth 3 (three) times daily before meals., Starting 04/01/2011, Until Tue 04/11/11, Normal      CONTINUE these medications which have CHANGED   Details  insulin glargine (LANTUS SOLOSTAR) 100 UNIT/ML  injection Inject 40 Units into the skin at bedtime., Starting 04/01/2011, Until Sun 03/31/12, No Print    promethazine (PHENERGAN) 25 MG tablet Take 1 tablet (25 mg total) by mouth every 6 (six) hours as needed for nausea., Starting 04/01/2011, Until Sat 04/08/11, Print      CONTINUE these medications which have NOT CHANGED   Details  aspirin 325 MG tablet Take 325 mg by mouth daily. , Until Discontinued, Historical Med    clonazePAM (KLONOPIN) 0.5 MG tablet Take 0.5 mg by mouth 3 (three) times daily as needed. For anxiety., Until Discontinued, Historical Med    clopidogrel  (PLAVIX) 75 MG tablet Take 75 mg by mouth every morning. , Until Discontinued, Historical Med    ferrous gluconate (FERGON) 325 MG tablet Take 325 mg by mouth daily with breakfast. , Until Discontinued, Historical Med    Fluticasone-Salmeterol (ADVAIR) 100-50 MCG/DOSE AEPB Inhale 1 puff into the lungs every 12 (twelve) hours.  , Until Discontinued, Historical Med    omeprazole (PRILOSEC) 40 MG capsule Take 40 mg by mouth daily. , Until Discontinued, Historical Med      STOP taking these medications     PRESCRIPTION MEDICATION         CONSULTS:    None.   PROCEDURES PERFORMED:  None.  ADMISSION DATA: H&P: Shirley [name] is a 35 y.o. woman who presents today for follow up from her ED visit yesterday. She presented to the [facility] ED on 03/29/11 with a 1 day history of left lower quadrant pain similar to her usual flares of chronic abdominal pain. She was given phenergan and 1 dose of 2 mg of morphine and was discharged home because she was better and was eating. She states she went home and laid in bed all day. This morning she hasn't eaten anything because of nausea, and subsequently started retching. She presented to clinic and continued to retch throughout her stay in clinic. She states that she has not eaten since the sandwich in the ED. She took 25 mg of phenergan this morning but threw it back up. She states she had a bowel movement after straining this morning that was hard and small. She denies fevers, chills, or chest pain.   Physical Exam: Wt Readings from Last 3 Encounters:   03/30/11  233 lb 8 oz (105.915 kg)   03/23/11  229 lb 11.2 oz (104.191 kg)   03/14/11  190 lb (86.183 kg)    Temp Readings from Last 3 Encounters:   03/30/11  97.8 F (36.6 C) Oral    BP Readings from Last 3 Encounters:   03/30/11  180/125   03/29/11  169/97   03/23/11  128/89    Pulse Readings from Last 3 Encounters:   03/30/11  112   03/29/11  97   03/23/11  98    Physical Exam:    Constitutional: She appears well-developed.  HENT: Normocephalic.  Neck: Normal range of motion. Neck supple.  Cardiovascular: Normal heart sounds. tachycardic  Respiratory: Effort normal and breath sounds normal.  GI: She exhibits no distension and no mass. There is no rebound. Soft, exquisitely tender diffusely left upper and lower quadrant voluntary guarding present  Musculoskeletal: Normal range of motion.  Neurological: She is alert.  Labs: Results for orders placed in visit on 03/30/11 (from the past 48 hour(s))   GLUCOSE, CAPILLARY Status: Abnormal    Collection Time    03/30/11 2:26 PM   Component  Value  Range  Comment  Glucose-Capillary  357 (*)  70 - 99 (mg/dL)    COMPREHENSIVE METABOLIC PANEL Status: Abnormal    Collection Time    03/30/11 4:45 PM   Component  Value  Range  Comment    Sodium  137  135 - 145 (mEq/L)     Potassium  3.6  3.5 - 5.3 (mEq/L)     Chloride  100  96 - 112 (mEq/L)     CO2  25  19 - 32 (mEq/L)     Glucose, Bld  297 (*)  70 - 99 (mg/dL)     BUN  11  6 - 23 (mg/dL)     Creat  1.61  0.96 - 1.10 (mg/dL)     Total Bilirubin  0.2 (*)  0.3 - 1.2 (mg/dL)     Alkaline Phosphatase  73  39 - 117 (U/L)     AST  14  0 - 37 (U/L)     ALT  7  0 - 35 (U/L)     Total Protein  8.8 (*)  6.0 - 8.3 (g/dL)     Albumin  3.6  3.5 - 5.2 (g/dL)     Calcium  9.8  8.4 - 10.5 (mg/dL)    CBC WITH DIFFERENTIAL Status: Abnormal    Collection Time    03/30/11 4:45 PM   Component  Value  Range  Comment    WBC  13.3 (*)  4.0 - 10.5 (K/uL)     RBC  5.28 (*)  3.87 - 5.11 (MIL/uL)     Hemoglobin  10.4 (*)  12.0 - 15.0 (g/dL)     HCT  04.5 (*)  40.9 - 46.0 (%)     MCV  61.2 (*)  78.0 - 100.0 (fL)     MCH  19.7 (*)  26.0 - 34.0 (pg)     MCHC  32.2  30.0 - 36.0 (g/dL)     RDW  81.1 (*)  91.4 - 15.5 (%)     Platelets  447 (*)  150 - 400 (K/uL)     Neutrophils Relative  73  43 - 77 (%)     Neutro Abs  9.7 (*)  1.7 - 7.7 (K/uL)     Lymphocytes Relative  21  12 - 46 (%)      Lymphs Abs  2.7  0.7 - 4.0 (K/uL)     Monocytes Relative  5  3 - 12 (%)     Monocytes Absolute  0.7  0.1 - 1.0 (K/uL)     Eosinophils Relative  1  0 - 5 (%)     Eosinophils Absolute  0.1  0.0 - 0.7 (K/uL)     Basophils Relative  0  0 - 1 (%)     Basophils Absolute  0.0  0.0 - 0.1 (K/uL)     Smear Review  See Note   Target cells    No results found.  Lab Results   Component  Value  Date    HGBA1C  11.0*  03/04/2011       HOSPITAL COURSE: 1. Abdominal pain with nausea/vomiting - Shirley [name] has chronic complaints of abdominal pain, nausea, vomiting. This has been evaluated extensively in the past including EGD 02/2010 showing only mild gastritis. The leading diagnosis at this time is thought to be uncontrolled gastroparesis - she did have a normal gastric emptying study in 2011 - however, pt was notably on Reglan at that time. Therefore, was likely inaccurate of  her baseline gastroparesis. Normalization of her gastric emptying on Reglan at least suggests possibly this was helpful towards gastric emptying. Therefore, the pt was restarted on her Reglan therapy. She has also had CT abdomen & US done in November 2012 showing no evidence of acute pathology except a left sided corpus luteal cyst, that should not cause the extent of pain that she reports. Depression/ anxiety/ somatization thought to likely play a role. Pt was treated with tylenol, zofran, phenergan, and restarted on reglan. Will need to follow-up outpatient for further GI workup if this continues.. Also suspect depression/anxiety plays a component in patient's N/V.   2. Microcytic anemia - not an acute issue. She remained at her baseline throughout the hospital course without evidence of acute bleed. BL Hgb is 9-11. 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 Chart review suggests Hemoglobin C, and  electrophoresis in 2009, but no documentation of electrophoresis itself.   3. DMII, A1c 11 - started on reduced dose lantus in setting of decreased oral intake, however, CBG in 200-300s on 12/06, therefore escalated to Lantus 40 with better control. Will continue home on reduced dose Lantus, with close clinic follow-up for escalation of therapy as indicated.   4. History of Bacterial Vaginosis - Patient was discharged with 2 days of flagyl therapy during last admission in 02/2011, however, pt did not fill prescription because of the $3 cost of Flagyl. She continued to describe vaginal discharge, therefore, she was treated with IV flagyl during hospital course.   5. Hypertension - BPs during hospital course remained elevated into 180s/ 100s, therefore, PRN Hydralazine was given (secondary to limited Labetalol availability inpatient). Given persistently elevated pressures, low-dose Labetalol was initiated at discharge. This will need to be monitored as an outpatient with adjustment of therapy as indicated. Continued to stress importance of cocaine cessation.    6. Polysubstance abuse - Hx of cocaine (last positive UDS 02/2011) and marijuana use (current positive UDS). Refuses social work/ psych help with cessation.   7. Depression/Anxiety - Pt resistant to discussion (inpatient and outpatient) and initiation of antidepressant although seems clearly depressed. States anxiety controlled on Klonopin. Will need further discussion as an outpatient.   DISCHARGE DATA: Vital Signs: BP 108/69  Pulse 90  Temp(Src) 98 F (36.7 C) (Oral)  Resp 18  Ht 5\' 7"  (1.702 m)  Wt 225 lb 8.5 oz (102.3 kg)  BMI 35.32 kg/m2  SpO2 97%  LMP 03/09/2011  Labs: Results for orders placed during the hospital encounter of 03/30/11 (from the past 24 hour(s))  GLUCOSE, CAPILLARY     Status: Abnormal   Collection Time   03/31/11  5:05 PM      Component Value Range   Glucose-Capillary 192 (*) 70 - 99 (mg/dL)   Comment 1  Notify RN    GLUCOSE, CAPILLARY     Status: Abnormal   Collection Time   03/31/11  9:02 PM      Component Value Range   Glucose-Capillary 180 (*) 70 - 99 (mg/dL)  BASIC METABOLIC PANEL     Status: Abnormal   Collection Time   04/01/11  6:00 AM      Component Value Range   Sodium 136  135 - 145 (mEq/L)   Potassium 3.7  3.5 - 5.1 (mEq/L)   Chloride 102  96 - 112 (mEq/L)   CO2 23  19 - 32 (mEq/L)  Glucose, Bld 126 (*) 70 - 99 (mg/dL)   BUN 9  6 - 23 (mg/dL)   Creatinine, Ser 1.61 (*) 0.50 - 1.10 (mg/dL)   Calcium 9.2  8.4 - 09.6 (mg/dL)   GFR calc non Af Amer >90  >90 (mL/min)   GFR calc Af Amer >90  >90 (mL/min)  GLUCOSE, CAPILLARY     Status: Abnormal   Collection Time   04/01/11  7:56 AM      Component Value Range   Glucose-Capillary 125 (*) 70 - 99 (mg/dL)  GLUCOSE, CAPILLARY     Status: Abnormal   Collection Time   04/01/11 11:44 AM      Component Value Range   Glucose-Capillary 142 (*) 70 - 99 (mg/dL)   Comment 1 Notify RN        Signed: Johnette Abraham, D.O.  PGY II, Internal Medicine Resident 04/01/2011, 3:48 PM

## 2011-04-01 NOTE — Assessment & Plan Note (Signed)
BP Readings from Last 3 Encounters:  04/01/11 108/69  03/30/11 180/125  03/29/11 169/97    Basic Metabolic Panel:    Component Value Date/Time   NA 136 04/01/2011 0600   K 3.7 04/01/2011 0600   CL 102 04/01/2011 0600   CO2 23 04/01/2011 0600   BUN 9 04/01/2011 0600   CREATININE 0.49* 04/01/2011 0600   CREATININE 0.63 03/30/2011 1645   GLUCOSE 126* 04/01/2011 0600   CALCIUM 9.2 04/01/2011 0600    Assessment: Hypertension Control: controlled  Progress toward goals: improved  Barriers to meeting goals: no barriers identified   Plan:  Not currently on therapy. Was considered to be a candidate for Labetalol during last hospitalization and was actually treated with it during hospital course. However, BP is stable today, will not start.  Will reevaluate need to start Labetalol.

## 2011-04-05 ENCOUNTER — Encounter: Payer: Medicaid Other | Admitting: Internal Medicine

## 2011-04-11 ENCOUNTER — Other Ambulatory Visit: Payer: Self-pay | Admitting: *Deleted

## 2011-04-11 ENCOUNTER — Other Ambulatory Visit: Payer: Self-pay | Admitting: Internal Medicine

## 2011-04-11 DIAGNOSIS — R112 Nausea with vomiting, unspecified: Secondary | ICD-10-CM

## 2011-04-11 DIAGNOSIS — I2510 Atherosclerotic heart disease of native coronary artery without angina pectoris: Secondary | ICD-10-CM

## 2011-04-11 MED ORDER — OMEPRAZOLE 40 MG PO CPDR: 40.0000 mg | DELAYED_RELEASE_CAPSULE | Freq: Every day | ORAL | Status: DC

## 2011-04-11 NOTE — Telephone Encounter (Signed)
Pt also needs a refill on her Pen needles .  Angelina Ok, RN 04/11/2011 11:18 AM

## 2011-04-14 IMAGING — CR DG CHEST 2V
2 series · 2 of 2 positions shown · non-contrast
Comparison: None

CLINICAL DATA: Chest pain

CHEST - 2 VIEW

[view not recorded (1 of 2)]
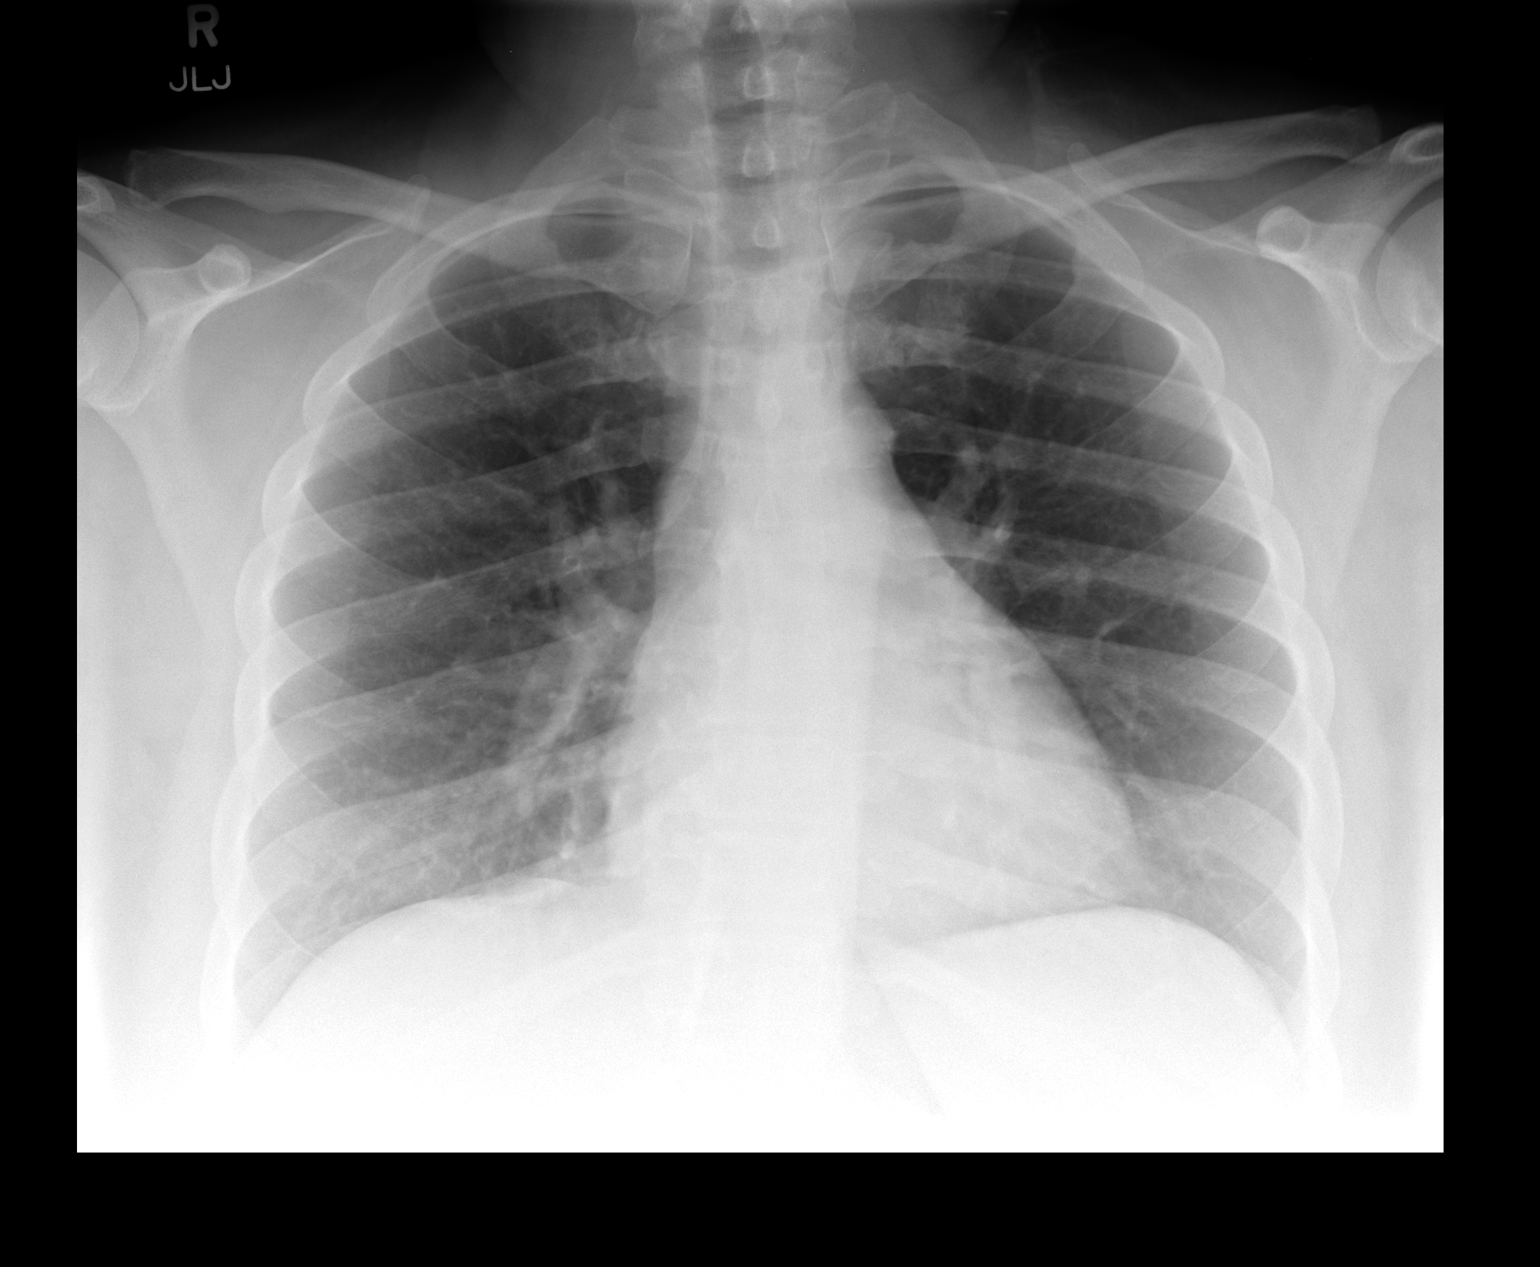

[view not recorded (2 of 2)]
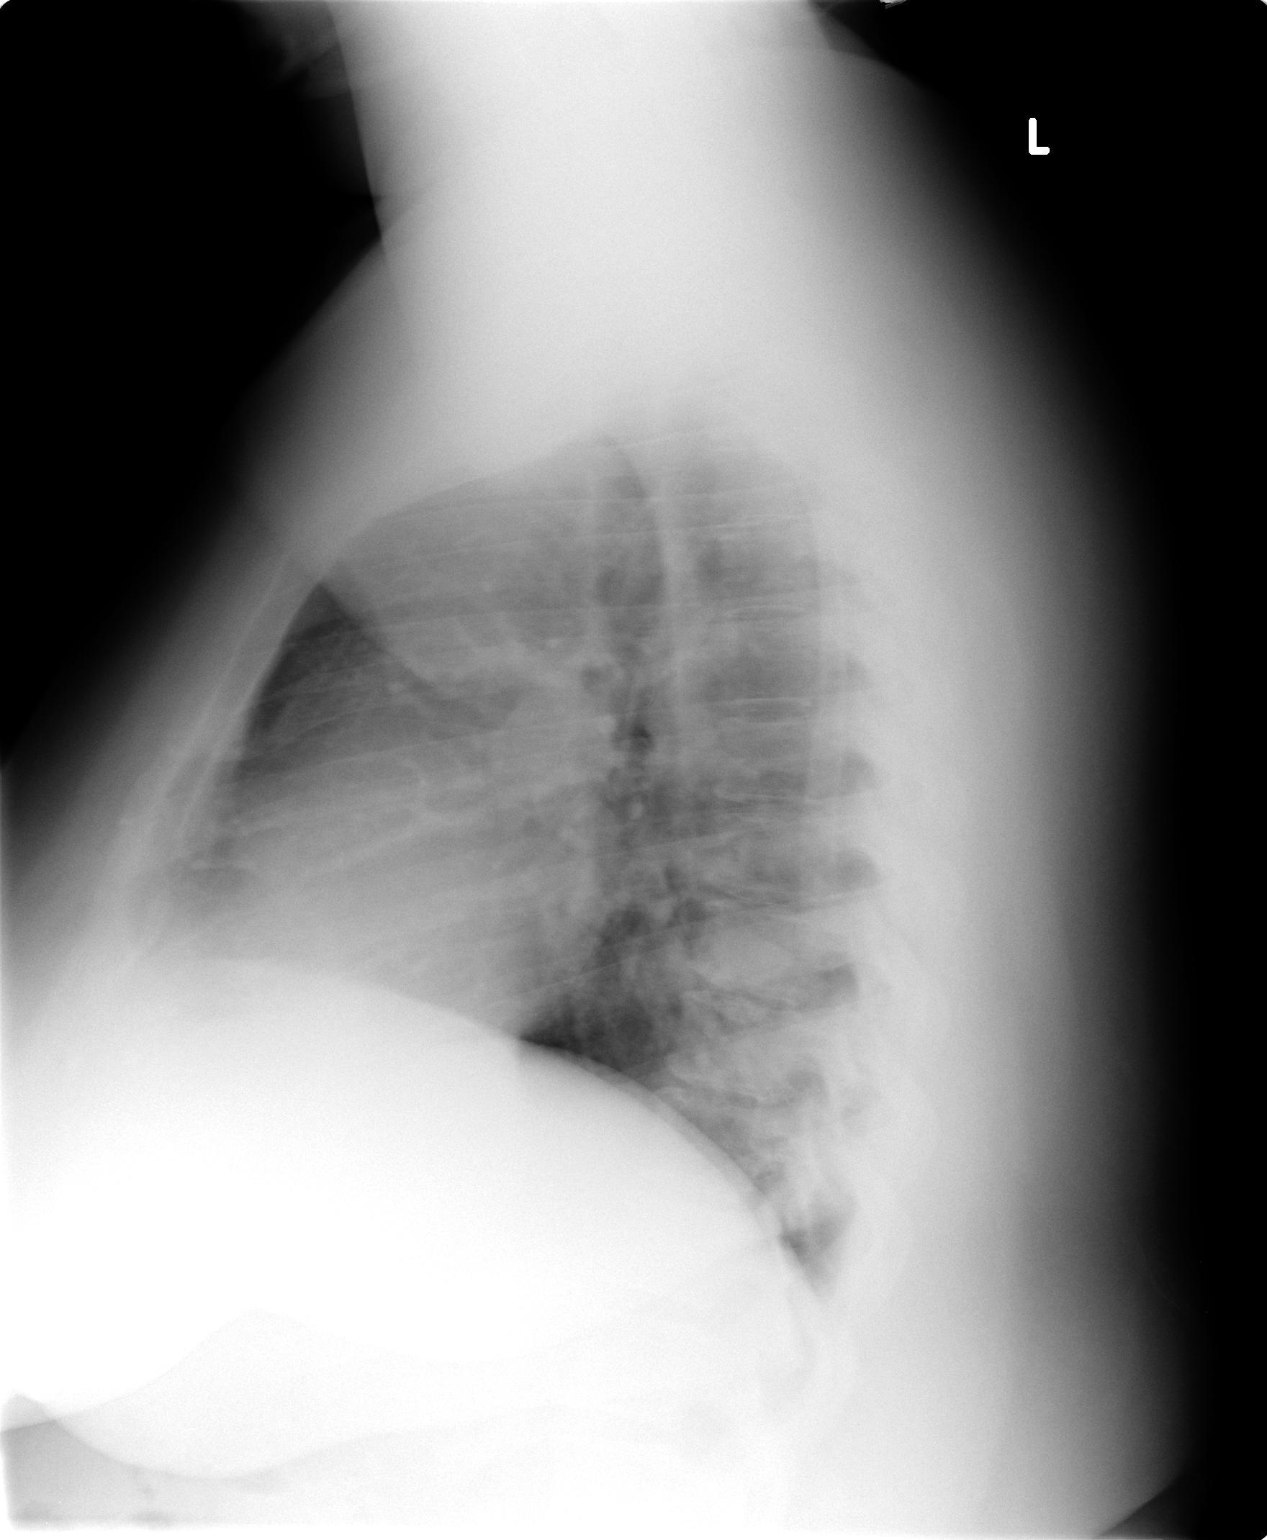

[2 of 2 positions shown; findings below may reference images not displayed]

FINDINGS: The heart size and mediastinal contours are within normal
limits.  Both lungs are clear.
IMPRESSION: No active disease.

## 2011-04-27 ENCOUNTER — Encounter (HOSPITAL_COMMUNITY): Payer: Self-pay | Admitting: Emergency Medicine

## 2011-04-27 ENCOUNTER — Emergency Department (HOSPITAL_COMMUNITY)
Admission: EM | Admit: 2011-04-27 | Discharge: 2011-04-27 | Disposition: A | Discharge status: Home / Self Care | Payer: Medicaid Other | Attending: Emergency Medicine | Admitting: Emergency Medicine

## 2011-04-27 DIAGNOSIS — IMO0001 Reserved for inherently not codable concepts without codable children: Secondary | ICD-10-CM | POA: Insufficient documentation

## 2011-04-27 DIAGNOSIS — I2510 Atherosclerotic heart disease of native coronary artery without angina pectoris: Secondary | ICD-10-CM | POA: Insufficient documentation

## 2011-04-27 DIAGNOSIS — E785 Hyperlipidemia, unspecified: Secondary | ICD-10-CM | POA: Insufficient documentation

## 2011-04-27 DIAGNOSIS — W208XXA Other cause of strike by thrown, projected or falling object, initial encounter: Secondary | ICD-10-CM | POA: Insufficient documentation

## 2011-04-27 DIAGNOSIS — F341 Dysthymic disorder: Secondary | ICD-10-CM | POA: Insufficient documentation

## 2011-04-27 DIAGNOSIS — R000 Tachycardia, unspecified: Secondary | ICD-10-CM | POA: Insufficient documentation

## 2011-04-27 DIAGNOSIS — K738 Other chronic hepatitis, not elsewhere classified: Secondary | ICD-10-CM | POA: Insufficient documentation

## 2011-04-27 DIAGNOSIS — I10 Essential (primary) hypertension: Secondary | ICD-10-CM | POA: Insufficient documentation

## 2011-04-27 DIAGNOSIS — Z7982 Long term (current) use of aspirin: Secondary | ICD-10-CM | POA: Insufficient documentation

## 2011-04-27 DIAGNOSIS — Z79899 Other long term (current) drug therapy: Secondary | ICD-10-CM | POA: Insufficient documentation

## 2011-04-27 DIAGNOSIS — R51 Headache: Secondary | ICD-10-CM | POA: Insufficient documentation

## 2011-04-27 DIAGNOSIS — E119 Type 2 diabetes mellitus without complications: Secondary | ICD-10-CM | POA: Insufficient documentation

## 2011-04-27 DIAGNOSIS — S0990XA Unspecified injury of head, initial encounter: Secondary | ICD-10-CM | POA: Insufficient documentation

## 2011-04-27 DIAGNOSIS — I252 Old myocardial infarction: Secondary | ICD-10-CM | POA: Insufficient documentation

## 2011-04-27 DIAGNOSIS — J45909 Unspecified asthma, uncomplicated: Secondary | ICD-10-CM | POA: Insufficient documentation

## 2011-04-27 MED ORDER — OXYCODONE-ACETAMINOPHEN 5-325 MG PO TABS
2.0000 | ORAL_TABLET | Freq: Once | ORAL | Status: AC
  Administered 2011-04-27: 2 via ORAL
  Filled 2011-04-27: qty 2
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
## 2011-04-27 NOTE — ED Provider Notes (Signed)
Medical screening examination/treatment/procedure(s) were performed by non-physician practitioner and as supervising physician I was immediately available for consultation/collaboration.  Senta Kantor K Graceson Nichelson-Rasch, MD 04/27/11 0537 

## 2011-04-27 NOTE — ED Provider Notes (Signed)
History     CSN: [id number]  Arrival date & time 04/27/11  8413   First MD Initiated Contact with Patient 04/27/11 0445      Chief Complaint  Patient presents with  . Head Injury    (Consider location/radiation/quality/duration/timing/severity/associated sxs/prior treatment) HPI Comments: Patient states when she walked into her bedroom door fell and hit her on the left side of her head did nn LOC.  This happened approximately 4 and half hours ago.  Denies dizziness, nausea, vomiting, visual disturbances. Took 2 Advil without relief  Patient is a 36 y.o. female presenting with head injury. The history is provided by the patient.  Head Injury  The incident occurred 3 to 5 hours ago. She came to the ER via EMS. The injury mechanism was a direct blow. There was no loss of consciousness. There was no blood loss. The pain is at a severity of 3/10. The pain is mild. The pain has been constant since the injury. Pertinent negatives include no numbness, no blurred vision, no vomiting, no tinnitus, no disorientation, no weakness and no memory loss. She has tried NSAIDs for the symptoms. The treatment provided no relief.    Past Medical History  Diagnosis Date  . Thrombocytosis     Hem/Onc suggested 2/2 chronic hepatits and/or iron deficiency anemia  . Iron deficiency anemia   . N&V (nausea and vomiting)     and  Abdominal pain, chronic. Unclear ethiology with multiple admission and ED visits. Work up including Abdominal xray showed mostly nonspecific bowel gas pattern, CT abdomen with and without contrast (02/2011)  showed no acute process. Gastic Emptying scan (01/2010) was normal. Ultrasound of the abdomen was within normal limits. Hepatitis B viral load was undectable. HIV nonreactive. EGD (11/09/  . Abdominal pain   . Hypertension   . Hyperlipidemia   . Diabetes mellitus   . Recurrent boils   . Back pain   . OSA (obstructive sleep apnea)   . CAD (coronary artery disease)     s/p  Subendocardial MI with PDA angioplasty(no stent) on 06/15/06 and relook  cath 06/19/06 showed patency of site. Cath 12/10- no restenosis or significant CAD progression  . Irregular menses     Small ovarian follicles seen on CT(9/06)  . History of pyelonephritis     H/o GrpB Pyelonephritis (9/06) and UTI- 07/11- E.Coli, 12/10- GBS  . Abscess of tunica vaginalis     10/09- Abundant S. aureus- sensitive to all abx  . Obesity   . Asthma   . Gastritis   . Depression   . Hepatitis B, chronic     Hep BeAb+,Hep B cAb+ & Hep BsAg+ (9/06)  . Peripheral neuropathy   . Fibromyalgia   . GERD (gastroesophageal reflux disease)   . Migraine   . Anxiety   . Urinary tract infection   . Gastroparesis     secondary to poorly controlled DM, last emptying study performed 01/2010  was normal but may be falsely positive as pt was on reglan  . MI (myocardial infarction) 06/15/2006  . Blood dyscrasia     Past Surgical History  Procedure Date  . Cesarean section 1997    Family History  Problem Relation Age of Onset  . Diabetes Father     History  Substance Use Topics  . Smoking status: Former Smoker    Types: Cigarettes    Quit date: 04/24/1996  . Smokeless tobacco: Never Used   Comment: quit smoking cigarettes age 11  . Alcohol  Use: Yes     03/31/11 "used marijuana last ~ 2 wk ago; only drink on occasions"    OB History    Grav Para Term Preterm Abortions TAB SAB Ect Mult Living                  Review of Systems  Constitutional: Negative for activity change.  HENT: Negative for neck pain and tinnitus.   Eyes: Negative for blurred vision and visual disturbance.  Gastrointestinal: Negative for nausea and vomiting.  Genitourinary: Negative.   Skin: Negative.   Neurological: Negative for dizziness, weakness and numbness.  Psychiatric/Behavioral: Negative.  Negative for memory loss.    Allergies  Lisinopril  Home Medications   Current Outpatient Rx  Name Route Sig Dispense Refill    . ASPIRIN 325 MG PO TABS Oral Take 325 mg by mouth daily.     Marland Kitchen CLONAZEPAM 0.5 MG PO TABS Oral Take 0.5 mg by mouth 3 (three) times daily as needed. For anxiety.    . CLOPIDOGREL BISULFATE 75 MG PO TABS Oral Take 75 mg by mouth every morning.     Marland Kitchen FERROUS GLUCONATE 325 MG PO TABS Oral Take 325 mg by mouth daily with breakfast.     . FLUTICASONE-SALMETEROL 100-50 MCG/DOSE IN AEPB Inhalation Inhale 1 puff into the lungs every 12 (twelve) hours.      . INSULIN GLARGINE 100 UNIT/ML [place] SOLN Subcutaneous Inject 40 Units into the skin at bedtime. 10 mL   . LABETALOL HCL 100 MG PO TABS Oral Take 0.5 tablets (50 mg total) by mouth 2 (two) times daily. 15 tablet 0  . OMEPRAZOLE 40 MG PO CPDR Oral Take 1 capsule (40 mg total) by mouth daily. 31 capsule 6    BP 131/83  Pulse 103  Temp 99.4 F (37.4 C)  Resp 20  Wt 197 lb (89.359 kg)  SpO2 99%  LMP 04/10/2011  Physical Exam  Constitutional: She is oriented to person, place, and time. She appears well-developed and well-nourished.  HENT:  Head: Normocephalic and atraumatic.  Right Ear: External ear normal.  Left Ear: External ear normal.       No hematoma, abrasion noted  Eyes: Pupils are equal, round, and reactive to light.  Cardiovascular: Tachycardia present.   Pulmonary/Chest: Effort normal.  Abdominal: Soft.  Musculoskeletal: Normal range of motion.  Neurological: She is oriented to person, place, and time.  Skin: Skin is warm and dry.    ED Course  Procedures (including critical care time)  Labs Reviewed - No data to display No results found.   1. Minor head injury       MDM  Is no visible sign of any head trauma.  No hematoma.  No fluctuance to any portion of the scalp.  No abrasions.  There is no bleeding from the ears or nose.  Pupils are equal bilaterally, react quickly and briskly Will medicate while in the emergency department, and discharged and have patient followup with her primary care doctor with minor head  injury instructions.        Arman Filter, NP 04/27/11 1610  Arman Filter, NP 04/27/11 9604

## 2011-04-27 NOTE — ED Notes (Signed)
PATIENT STABLE UPON DISCHARGE.

## 2011-05-26 ENCOUNTER — Other Ambulatory Visit: Payer: Self-pay | Admitting: Internal Medicine

## 2011-06-21 ENCOUNTER — Emergency Department (HOSPITAL_COMMUNITY)
Admission: EM | Admit: 2011-06-21 | Discharge: 2011-06-21 | Disposition: A | Discharge status: Home / Self Care | Payer: Medicaid Other | Attending: Emergency Medicine | Admitting: Emergency Medicine

## 2011-06-21 ENCOUNTER — Encounter (HOSPITAL_COMMUNITY): Payer: Self-pay | Admitting: *Deleted

## 2011-06-21 DIAGNOSIS — R10814 Left lower quadrant abdominal tenderness: Secondary | ICD-10-CM | POA: Insufficient documentation

## 2011-06-21 DIAGNOSIS — F1210 Cannabis abuse, uncomplicated: Secondary | ICD-10-CM | POA: Insufficient documentation

## 2011-06-21 DIAGNOSIS — I252 Old myocardial infarction: Secondary | ICD-10-CM | POA: Insufficient documentation

## 2011-06-21 DIAGNOSIS — F329 Major depressive disorder, single episode, unspecified: Secondary | ICD-10-CM | POA: Insufficient documentation

## 2011-06-21 DIAGNOSIS — I10 Essential (primary) hypertension: Secondary | ICD-10-CM | POA: Insufficient documentation

## 2011-06-21 DIAGNOSIS — IMO0001 Reserved for inherently not codable concepts without codable children: Secondary | ICD-10-CM | POA: Insufficient documentation

## 2011-06-21 DIAGNOSIS — D509 Iron deficiency anemia, unspecified: Secondary | ICD-10-CM | POA: Insufficient documentation

## 2011-06-21 DIAGNOSIS — J45909 Unspecified asthma, uncomplicated: Secondary | ICD-10-CM | POA: Insufficient documentation

## 2011-06-21 DIAGNOSIS — E119 Type 2 diabetes mellitus without complications: Secondary | ICD-10-CM | POA: Insufficient documentation

## 2011-06-21 DIAGNOSIS — T50904A Poisoning by unspecified drugs, medicaments and biological substances, undetermined, initial encounter: Secondary | ICD-10-CM | POA: Insufficient documentation

## 2011-06-21 DIAGNOSIS — Z794 Long term (current) use of insulin: Secondary | ICD-10-CM | POA: Insufficient documentation

## 2011-06-21 DIAGNOSIS — F1910 Other psychoactive substance abuse, uncomplicated: Secondary | ICD-10-CM

## 2011-06-21 DIAGNOSIS — K219 Gastro-esophageal reflux disease without esophagitis: Secondary | ICD-10-CM | POA: Insufficient documentation

## 2011-06-21 DIAGNOSIS — R1110 Vomiting, unspecified: Secondary | ICD-10-CM | POA: Insufficient documentation

## 2011-06-21 DIAGNOSIS — F3289 Other specified depressive episodes: Secondary | ICD-10-CM | POA: Insufficient documentation

## 2011-06-21 DIAGNOSIS — Z7982 Long term (current) use of aspirin: Secondary | ICD-10-CM | POA: Insufficient documentation

## 2011-06-21 DIAGNOSIS — K739 Chronic hepatitis, unspecified: Secondary | ICD-10-CM | POA: Insufficient documentation

## 2011-06-21 DIAGNOSIS — I2510 Atherosclerotic heart disease of native coronary artery without angina pectoris: Secondary | ICD-10-CM | POA: Insufficient documentation

## 2011-06-21 DIAGNOSIS — F1410 Cocaine abuse, uncomplicated: Secondary | ICD-10-CM | POA: Insufficient documentation

## 2011-06-21 DIAGNOSIS — T50901A Poisoning by unspecified drugs, medicaments and biological substances, accidental (unintentional), initial encounter: Secondary | ICD-10-CM | POA: Insufficient documentation

## 2011-06-21 DIAGNOSIS — G609 Hereditary and idiopathic neuropathy, unspecified: Secondary | ICD-10-CM | POA: Insufficient documentation

## 2011-06-21 DIAGNOSIS — G43909 Migraine, unspecified, not intractable, without status migrainosus: Secondary | ICD-10-CM | POA: Insufficient documentation

## 2011-06-21 DIAGNOSIS — Z87891 Personal history of nicotine dependence: Secondary | ICD-10-CM | POA: Insufficient documentation

## 2011-06-21 LAB — URINALYSIS, ROUTINE W REFLEX MICROSCOPIC
Bilirubin Urine: NEGATIVE
Leukocytes, UA: NEGATIVE
Nitrite: NEGATIVE
Specific Gravity, Urine: 1.041 — ABNORMAL HIGH (ref 1.005–1.030)
pH: 5.5 (ref 5.0–8.0)

## 2011-06-21 LAB — RAPID URINE DRUG SCREEN, HOSP PERFORMED
Barbiturates: NOT DETECTED
Tetrahydrocannabinol: POSITIVE — AB

## 2011-06-21 LAB — DIFFERENTIAL
Basophils Absolute: 0.1 10*3/uL (ref 0.0–0.1)
Basophils Relative: 0 % (ref 0–1)
Monocytes Absolute: 1 10*3/uL (ref 0.1–1.0)
Neutro Abs: 9.1 10*3/uL — ABNORMAL HIGH (ref 1.7–7.7)
Neutrophils Relative %: 65 % (ref 43–77)

## 2011-06-21 LAB — CBC
HCT: 32.1 % — ABNORMAL LOW (ref 36.0–46.0)
MCHC: 33 g/dL (ref 30.0–36.0)
RDW: 19.4 % — ABNORMAL HIGH (ref 11.5–15.5)

## 2011-06-21 LAB — URINE MICROSCOPIC-ADD ON

## 2011-06-21 LAB — PROTIME-INR
INR: 1.08 (ref 0.00–1.49)
Prothrombin Time: 14.2 seconds (ref 11.6–15.2)

## 2011-06-21 LAB — COMPREHENSIVE METABOLIC PANEL
Albumin: 3.2 g/dL — ABNORMAL LOW (ref 3.5–5.2)
Alkaline Phosphatase: 71 U/L (ref 39–117)
BUN: 10 mg/dL (ref 6–23)
Potassium: 3.4 mEq/L — ABNORMAL LOW (ref 3.5–5.1)
Total Protein: 8.3 g/dL (ref 6.0–8.3)

## 2011-06-21 LAB — SALICYLATE LEVEL: Salicylate Lvl: <2 mg/dL — ABNORMAL LOW (ref 2.8–20.0)

## 2011-06-21 LAB — ACETAMINOPHEN LEVEL: Acetaminophen (Tylenol), Serum: <15 ug/mL (ref 10–30)

## 2011-06-21 LAB — PREGNANCY, URINE: Preg Test, Ur: NEGATIVE

## 2011-06-21 LAB — LIPASE, BLOOD: Lipase: 27 U/L (ref 11–59)

## 2011-06-21 MED ORDER — OXYMETAZOLINE HCL 0.05 % NA SOLN
1.0000 | Freq: Once | NASAL | Status: AC
  Administered 2011-06-21: 1 via NASAL
  Filled 2011-06-21: qty 15

## 2011-06-21 MED ORDER — SODIUM CHLORIDE 0.9 % IV BOLUS (SEPSIS): 1000.0000 mL | Freq: Once | INTRAVENOUS | Status: DC

## 2011-06-21 MED ORDER — ONDANSETRON HCL 4 MG/2ML IJ SOLN: 4.0000 mg | Freq: Once | INTRAMUSCULAR | Status: DC

## 2011-06-21 NOTE — BH Assessment (Signed)
Assessment Note   Shirley [name] is an 36 y.o. female.  Shirley [name] came to [facility] after she had ingested an unknown amount of pills to address nausea, vomiting and stomach discomfort.  Dr. Ranae Palms said that it did not appear that she took too much of her medicines.  Rosalina denies that this was a suicide attempt.  She denies any current plan or intention to harm herself and says that she never has tried to harm herself before.  She says, "I just wanted the nausea and vomiting to go away."  [name] says that she does not want to harm herself because she has her kids and loves them.  [name] also denies any HI or A/V hallucinations.  She says that she goes to [facility] and sees a therapist named [facility] and goes there twice weekly.  Myron said nothing however when confronted about her positive cocaine and THC labs.  She only admitted to using them in the last 24 hours.  [name] is able to contract for safety and does have her children in the home with her.  Dr. Ranae Palms agreed with her signing a no harm contract and returning home.  She is to see her current outpatient provider before the end of the week. Axis I: Substance Abuse Axis II: Deferred Axis III:  Past Medical History  Diagnosis Date  . Thrombocytosis     Hem/Onc suggested 2/2 chronic hepatits and/or iron deficiency anemia  . Iron deficiency anemia   . N&V (nausea and vomiting)     and  Abdominal pain, chronic. Unclear ethiology with multiple admission and ED visits. Work up including Abdominal xray showed mostly nonspecific bowel gas pattern, CT abdomen with and without contrast (02/2011)  showed no acute process. Gastic Emptying scan (01/2010) was normal. Ultrasound of the abdomen was within normal limits. Hepatitis B viral load was undectable. HIV nonreactive. EGD (11/09/  . Abdominal pain   . Hypertension   . Hyperlipidemia   . Diabetes mellitus   . Recurrent boils   . Back pain   . OSA (obstructive sleep apnea)    . 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 Cath 12/10- no restenosis or significant CAD progression  . Irregular menses     Small ovarian follicles seen on CT(9/06)  . History of pyelonephritis     H/o GrpB Pyelonephritis (9/06) and UTI- 07/11- E.Coli, 12/10- GBS  . Abscess of tunica vaginalis     10/09- Abundant S. aureus- sensitive to all abx  . Obesity   . Asthma   . Gastritis   . Depression   . Hepatitis B, chronic     Hep BeAb+,Hep B cAb+ & Hep BsAg+ (9/06)  . Peripheral neuropathy   . Fibromyalgia   . GERD (gastroesophageal reflux disease)   . Migraine   . Anxiety   . Urinary tract infection   . Gastroparesis     secondary to poorly controlled DM, last emptying study performed 01/2010  was normal but may be falsely positive as pt was on reglan  . MI (myocardial infarction) 06/15/2006  . Blood dyscrasia    Axis IV: other psychosocial or environmental problems Axis V: 51-60 moderate symptoms  Past Medical History:  Past Medical History  Diagnosis Date  . Thrombocytosis     Hem/Onc suggested 2/2 chronic hepatits and/or iron deficiency anemia  . Iron deficiency anemia   .  N&V (nausea and vomiting)     and  Abdominal pain, chronic. Unclear ethiology with multiple admission and ED visits. Work up including Abdominal xray showed mostly nonspecific bowel gas pattern, CT abdomen with and without contrast (02/2011)  showed no acute process. Gastic Emptying scan (01/2010) was normal. Ultrasound of the abdomen was within normal limits. Hepatitis B viral load was undectable. HIV nonreactive. EGD (11/09/  . Abdominal pain   . Hypertension   . Hyperlipidemia   . Diabetes mellitus   . Recurrent boils   . Back pain   . OSA (obstructive sleep apnea)   . CAD (coronary artery disease)     s/p Subendocardial MI with PDA angioplasty(no stent) on 06/15/06 and relook  cath 06/19/06  showed patency of site. Cath 12/10- no restenosis or significant CAD progression  . Irregular menses     Small ovarian follicles seen on CT(9/06)  . History of pyelonephritis     H/o GrpB Pyelonephritis (9/06) and UTI- 07/11- E.Coli, 12/10- GBS  . Abscess of tunica vaginalis     10/09- Abundant S. aureus- sensitive to all abx  . Obesity   . Asthma   . Gastritis   . Depression   . Hepatitis B, chronic     Hep BeAb+,Hep B cAb+ & Hep BsAg+ (9/06)  . Peripheral neuropathy   . Fibromyalgia   . GERD (gastroesophageal reflux disease)   . Migraine   . Anxiety   . Urinary tract infection   . Gastroparesis     secondary to poorly controlled DM, last emptying study performed 01/2010  was normal but may be falsely positive as pt was on reglan  . MI (myocardial infarction) 06/15/2006  . Blood dyscrasia     Past Surgical History  Procedure Date  . Cesarean section 1997    Family History:  Family History  Problem Relation Age of Onset  . Diabetes Father     Social History:  reports that she quit smoking about 15 years ago. Her smoking use included Cigarettes. She has never used smokeless tobacco. She reports that she drinks alcohol. She reports that she uses illicit drugs (Marijuana).  Additional Social History:  Alcohol / Drug Use Pain Medications: See chart Prescriptions: See chart Over the Counter: N/A History of alcohol / drug use?: Yes Substance #1 Name of Substance 1: Marijuana 1 - Age of First Use: 36 years old 1 - Amount (size/oz): Would not divulge 1 - Frequency: Would not divulge 1 - Duration: Unknown 1 - Last Use / Amount: 02/26  Unknown amount Substance #2 Name of Substance 2: Cocaine 2 - Age of First Use: 36 years of age 49 - Amount (size/oz): Would not divulge 2 - Frequency: Would not divulge 2 - Duration: Unknown 2 - Last Use / Amount: 02/26 Unknown amount Allergies:  Allergies  Allergen Reactions  . Lisinopril Nausea Only    Home Medications:    Medications Prior to Admission  Medication Sig Dispense Refill  . aspirin 325 MG tablet Take 325 mg by mouth daily.       . clonazePAM (KLONOPIN) 0.5 MG tablet Take 0.5 mg by mouth 3 (three) times daily as needed. For anxiety.      . Fluticasone-Salmeterol (ADVAIR) 100-50 MCG/DOSE AEPB Inhale 1 puff into the lungs every 12 (twelve) hours.        . insulin glargine (LANTUS SOLOSTAR) 100 UNIT/ML injection Inject 40 Units into the skin at bedtime.  10 mL    . labetalol (NORMODYNE) 100  MG tablet Take 0.5 tablets (50 mg total) by mouth 2 (two) times daily.  15 tablet  0  . omeprazole (PRILOSEC) 40 MG capsule Take 1 capsule (40 mg total) by mouth daily.  31 capsule  6  . PLAVIX 75 MG tablet TAKE 1 TABLET BY MOUTH ONCE A DAY  31 tablet  0  . B-D ULTRAFINE III SHORT PEN 31G X 8 MM MISC USE TO INJECT INSULIN 4 TIMES A DAY  100 each  3  . ferrous gluconate (FERGON) 325 MG tablet Take 325 mg by mouth daily with breakfast.        Medications Prior to Admission  Medication Dose Route Frequency Provider Last Rate Last Dose  . oxymetazoline (AFRIN) 0.05 % nasal spray 1 spray  1 spray Each Nare Once Loren Racer, MD   1 spray at 06/21/11 0304  . DISCONTD: ondansetron (ZOFRAN) injection 4 mg  4 mg Intravenous Once Loren Racer, MD      . DISCONTD: sodium chloride 0.9 % bolus 1,000 mL  1,000 mL Intravenous Once Loren Racer, MD        OB/GYN Status:  No LMP recorded.  General Assessment Data Location of Assessment: [facility] ED Living Arrangements: Children (Has her children living with her, ages 72 & 74) Can pt return to current living arrangement?: Yes Admission Status: Voluntary Is patient capable of signing voluntary admission?: Yes Transfer from: Acute Hospital Referral Source: Self/Family/Friend     Risk to self Suicidal Ideation: No Suicidal Intent: No Is patient at risk for suicide?: No Suicidal Plan?: No Access to Means: No What has been your use of drugs/alcohol within the last 12  months?: Used marijuana & cocaine in last 24 hours Previous Attempts/Gestures: No How many times?: 0  Other Self Harm Risks: None Triggers for Past Attempts: None known Intentional Self Injurious Behavior: None Family Suicide History: No Recent stressful life event(s):  (None reported) Persecutory voices/beliefs?: No Depression: No Depression Symptoms:  (Denies depressive symptoms) Substance abuse history and/or treatment for substance abuse?: Yes Suicide prevention information given to non-admitted patients: Not applicable  Risk to Others Homicidal Ideation: No Thoughts of Harm to Others: No Current Homicidal Intent: No Current Homicidal Plan: No Access to Homicidal Means: No Identified Victim: No one History of harm to others?: No Assessment of Violence: None Noted Violent Behavior Description: Patient calm and cooperative Does patient have access to weapons?: No Criminal Charges Pending?: No Does patient have a court date: No  Psychosis Hallucinations: None noted Delusions: None noted  Mental Status Report Appear/Hygiene: Disheveled (pajamas) Eye Contact: Fair Motor Activity: Unremarkable Speech: Logical/coherent Level of Consciousness: Quiet/awake Mood: Anxious Affect: Appropriate to circumstance Anxiety Level: None Thought Processes: Coherent;Relevant Judgement: Impaired Orientation: Person;Place;Time;Situation Obsessive Compulsive Thoughts/Behaviors: None  Cognitive Functioning Concentration: Decreased Memory: Recent Intact;Remote Intact IQ: Average Insight: Fair Impulse Control: Fair Appetite: Good Weight Loss: 0  Weight Gain: 0  Sleep: No Change Total Hours of Sleep: 8  Vegetative Symptoms: None  Prior Inpatient Therapy Prior Inpatient Therapy: No Prior Therapy Dates: N/A Prior Therapy Facilty/Provider(s): N/A Reason for Treatment: N/A  Prior Outpatient Therapy Prior Outpatient Therapy: Yes Prior Therapy Dates: For the past 2 years Prior  Therapy Facilty/Provider(s): Shevaughn Rivers at [facility] Reason for Treatment: Did not divulge  ADL Screening (condition at time of admission) Patient's cognitive ability adequate to safely complete daily activities?: Yes Patient able to express need for assistance with ADLs?: Yes Independently performs ADLs?: Yes Weakness of Legs: None Weakness of Arms/Hands:  None  Home Assistive Devices/Equipment Home Assistive Devices/Equipment: None    Abuse/Neglect Assessment (Assessment to be complete while patient is alone) Physical Abuse: Denies Verbal Abuse: Denies Sexual Abuse: Denies Exploitation of patient/patient's resources: Denies Self-Neglect: Denies Values / Beliefs Cultural Requests During Hospitalization: None Spiritual Requests During Hospitalization: None        Additional Information 1:1 In Past 12 Months?: No CIRT Risk: No Elopement Risk: No Does patient have medical clearance?: Yes     Disposition:  Disposition Disposition of Patient: Outpatient treatment Type of outpatient treatment:  (Current provider)  On Site Evaluation by:   Reviewed with Physician:  Dr. Ranae Palms at [phone]   Beatriz Stallion Ray 06/21/2011 5:56 AM

## 2011-06-21 NOTE — ED Notes (Signed)
Tired and could not get to sleep and took pills. Immediately vomited with pills noted. Unsure of what pills she took.  She says she was not trying to hurt herself, she says she just wanted to go to sleep. VSS for EMS

## 2011-06-21 NOTE — ED Notes (Signed)
Phelbotomy tech into draw blood, pt says she does not want all of this done and wants to go home. Notified EDP of the same, he came into speak with PT who agrees to stay and have workup completed

## 2011-06-21 NOTE — ED Notes (Signed)
Pt ambulated with a  Steady gait; VSS; A&Ox3; no signs of distress; no questions at this time; respirations even and unlabored; skin warm and dry.

## 2011-06-21 NOTE — ED Provider Notes (Signed)
History     CSN: [id number]  Arrival date & time 06/21/11  0054   First MD Initiated Contact with Patient 06/21/11 0116      Chief Complaint  Patient presents with  . Ingestion    (Consider location/radiation/quality/duration/timing/severity/associated sxs/prior treatment) HPI Pt took unknown amount of pills at 2145-2200 last evening. She vomited immediately. States she was tired of vomiting but denies suicidal ideation. She later called her friend and told her what she had done. Her friend called EMS and was brought into ED. Pt denies current nausea and has no new abdominal pain. No chest pain or HA. Past Medical History  Diagnosis Date  . Thrombocytosis     Hem/Onc suggested 2/2 chronic hepatits and/or iron deficiency anemia  . Iron deficiency anemia   . N&V (nausea and vomiting)     and  Abdominal pain, chronic. Unclear ethiology with multiple admission and ED visits. Work up including Abdominal xray showed mostly nonspecific bowel gas pattern, CT abdomen with and without contrast (02/2011)  showed no acute process. Gastic Emptying scan (01/2010) was normal. Ultrasound of the abdomen was within normal limits. Hepatitis B viral load was undectable. HIV nonreactive. EGD (11/09/  . Abdominal pain   . Hypertension   . Hyperlipidemia   . Diabetes mellitus   . Recurrent boils   . Back pain   . OSA (obstructive sleep apnea)   . 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 Cath 12/10- no restenosis or significant CAD progression  . Irregular menses     Small ovarian follicles seen on CT(9/06)  . History of pyelonephritis     H/o GrpB Pyelonephritis (9/06) and UTI- 07/11- E.Coli, 12/10- GBS  . Abscess of tunica vaginalis     10/09- Abundant S. aureus- sensitive to all abx  . Obesity   . Asthma   . Gastritis   . Depression   . Hepatitis B, chronic     Hep BeAb+,Hep B cAb+ & Hep  BsAg+ (9/06)  . Peripheral neuropathy   . Fibromyalgia   . GERD (gastroesophageal reflux disease)   . Migraine   . Anxiety   . Urinary tract infection   . Gastroparesis     secondary to poorly controlled DM, last emptying study performed 01/2010  was normal but may be falsely positive as pt was on reglan  . MI (myocardial infarction) 06/15/2006  . Blood dyscrasia     Past Surgical History  Procedure Date  . Cesarean section 1997    Family History  Problem Relation Age of Onset  . Diabetes Father     History  Substance Use Topics  . Smoking status: Former Smoker    Types: Cigarettes    Quit date: 04/24/1996  . Smokeless tobacco: Never Used   Comment: quit smoking cigarettes age 1  . Alcohol Use: Yes     03/31/11 "used marijuana last ~ 2 wk ago; only drink on occasions"    OB History    Grav Para Term Preterm Abortions TAB SAB Ect Mult Living                  Review of Systems  Constitutional: Negative for fever and chills.  Respiratory: Negative for cough and shortness of breath.   Cardiovascular: Negative for leg swelling.  Gastrointestinal: Positive for vomiting. Negative for nausea, abdominal pain and diarrhea.  Neurological: Negative for dizziness, seizures,  weakness, numbness and headaches.  Psychiatric/Behavioral: Negative for suicidal ideas and hallucinations.    Allergies  Lisinopril  Home Medications   Current Outpatient Rx  Name Route Sig Dispense Refill  . ASPIRIN 325 MG PO TABS Oral Take 325 mg by mouth daily.     Marland Kitchen CLONAZEPAM 0.5 MG PO TABS Oral Take 0.5 mg by mouth 3 (three) times daily as needed. For anxiety.    Di Kindle SULFATE 325 (65 FE) MG PO TABS Oral Take 325 mg by mouth daily with breakfast.    . FLUTICASONE-SALMETEROL 100-50 MCG/DOSE IN AEPB Inhalation Inhale 1 puff into the lungs every 12 (twelve) hours.      Marland Kitchen HYOSCYAMINE SULFATE 0.125 MG PO TABS Oral Take 0.125 mg by mouth every 4 (four) hours as needed. cramping    . INSULIN  GLARGINE 100 UNIT/ML Binger SOLN Subcutaneous Inject 40 Units into the skin at bedtime. 10 mL   . LABETALOL HCL 100 MG PO TABS Oral Take 0.5 tablets (50 mg total) by mouth 2 (two) times daily. 15 tablet 0  . METOPROLOL TARTRATE 25 MG PO TABS Oral Take 25 mg by mouth 2 (two) times daily.    Marland Kitchen NITROFURANTOIN MACROCRYSTAL 100 MG PO CAPS Oral Take 100 mg by mouth 4 (four) times daily.    Marland Kitchen NITROFURANTOIN MONOHYD MACRO 100 MG PO CAPS Oral Take 100 mg by mouth 2 (two) times daily.    Marland Kitchen OMEPRAZOLE 40 MG PO CPDR Oral Take 1 capsule (40 mg total) by mouth daily. 31 capsule 6  . ONDANSETRON HCL 4 MG PO TABS Oral Take 4 mg by mouth every 8 (eight) hours as needed. nausea    . PLAVIX 75 MG PO TABS  TAKE 1 TABLET BY MOUTH ONCE A DAY 31 tablet 0  . PROMETHAZINE HCL 25 MG PO TABS Oral Take 25 mg by mouth every 6 (six) hours as needed. nausea    . TRAMADOL-ACETAMINOPHEN 37.5-325 MG PO TABS Oral Take 1 tablet by mouth daily. pain    . BD PEN NEEDLE SHORT U/F 31G X 8 MM MISC  USE TO INJECT INSULIN 4 TIMES A DAY 100 each 3  . FERROUS GLUCONATE 325 MG PO TABS Oral Take 325 mg by mouth daily with breakfast.       BP 158/100  Pulse 86  Temp(Src) 98.7 F (37.1 C) (Oral)  Resp 16  SpO2 98%  Physical Exam  Nursing note and vitals reviewed. Constitutional: She is oriented to person, place, and time. She appears well-developed and well-nourished. No distress.  HENT:  Head: Normocephalic and atraumatic.  Mouth/Throat: Oropharynx is clear and moist.  Eyes: EOM are normal. Pupils are equal, round, and reactive to light.  Neck: Normal range of motion. Neck supple.  Cardiovascular: Normal rate and regular rhythm.   Pulmonary/Chest: Effort normal and breath sounds normal. No respiratory distress. She has no wheezes. She has no rales.  Abdominal: Soft. Bowel sounds are normal. There is tenderness (very mild LLQ ttp). There is no rebound and no guarding.  Musculoskeletal: Normal range of motion. She exhibits no edema and  no tenderness.  Neurological: She is alert and oriented to person, place, and time.       5/5 motor in all ext. Sensation intact  Skin: Skin is warm and dry. No rash noted. No erythema.    ED Course  Procedures (including critical care time)  Labs Reviewed  CBC - Abnormal; Notable for the following:    WBC 14.1 (*)  RBC 5.23 (*)    Hemoglobin 10.6 (*)    HCT 32.1 (*)    MCV 61.4 (*)    MCH 20.3 (*)    RDW 19.4 (*)    Platelets 444 (*) PLATELET COUNT CONFIRMED BY SMEAR   All other components within normal limits  DIFFERENTIAL - Abnormal; Notable for the following:    Neutro Abs 9.1 (*)    All other components within normal limits  COMPREHENSIVE METABOLIC PANEL - Abnormal; Notable for the following:    Potassium 3.4 (*)    Glucose, Bld 270 (*)    Albumin 3.2 (*)    All other components within normal limits  URINALYSIS, ROUTINE W REFLEX MICROSCOPIC - Abnormal; Notable for the following:    APPearance TURBID (*)    Specific Gravity, Urine 1.041 (*)    Glucose, UA >1000 (*)    Hgb urine dipstick TRACE (*)    Ketones, ur 40 (*)    Protein, ur 30 (*)    All other components within normal limits  APTT - Abnormal; Notable for the following:    aPTT 20 (*)    All other components within normal limits  URINE RAPID DRUG SCREEN (HOSP PERFORMED) - Abnormal; Notable for the following:    Cocaine POSITIVE (*)    Tetrahydrocannabinol POSITIVE (*)    All other components within normal limits  SALICYLATE LEVEL - Abnormal; Notable for the following:    Salicylate Lvl <2.0 (*)    All other components within normal limits  URINE MICROSCOPIC-ADD ON - Abnormal; Notable for the following:    Squamous Epithelial / LPF FEW (*)    Bacteria, UA FEW (*)    All other components within normal limits  LIPASE, BLOOD  PREGNANCY, URINE  PROTIME-INR  ETHANOL  ACETAMINOPHEN LEVEL   No results found.   1. Overdose   2. Drug abuse       Date: 06/21/2011  Rate: 90  Rhythm: normal sinus  rhythm  QRS Axis: normal  Intervals: normal  ST/T Wave abnormalities: nonspecific ST changes  Conduction Disutrbances:none  Narrative Interpretation:   Old EKG Reviewed: unchanged   MDM  RN counted left pills from prescribed bottles with no significant amount missing. Medically cleared for psych to eval.   Evaluated by ACT. Again denies suicidal intent. ACT advises D/C home with follow-up for substance abuse. Pt to sign self harm contract and be d/c'd  Loren Racer, MD 06/21/11 [phone]

## 2011-06-21 NOTE — ED Notes (Signed)
Med                             Pills/qty          Date filled Nitrofur-Macr               10/14               02/04/2010 Ferrous Sulfate          62/90                 02/23/2010 Zofran                         20/30                02/23/2010 Metoprolol                   60/62                02/23/2010 Apap/tramadol            19/?                  ? Phenergan                  11/12                01/01/2011 Ferrous Gluconate      60/60              03/03/2010  Hycosamine               139/150           03/03/2010  Pt says there would have been no other bottles she would have taken from.  She does not know how many she took or from which bottles.

## 2011-06-21 NOTE — ED Notes (Signed)
[facility] team at bedside.  

## 2011-06-21 NOTE — ED Notes (Signed)
Pt standing at door demanding to be discharged. She reports she has to pick up her son. RN asked pt to remain in room until MD has her d/c instructions ready.

## 2011-06-21 NOTE — Discharge Instructions (Signed)
Drug Abuse and Addiction in Sports  There are many types of drugs that one may become addicted to including illegal drugs (marijuana, cocaine, amphetamines, hallucinogens, and narcotics), prescription drugs (hydrocodone, codeine, and alprazolam), and other chemicals such as alcohol or nicotine.  Two types of addiction exist: physical and emotional. Physical addiction usually occurs after prolonged use of a drug. However, some drugs may only take a couple uses before addiction can occur. Physical addiction is marked by withdrawal symptoms, in which the person experiences negative symptoms such as sweat, anxiety, tremors, hallucinations, or cravings in the absence of using the drug. Emotional dependence is the psychological desire for the "high" that the drugs produce when taken.  SYMPTOMS   · Inattentiveness.   · Negligence.   · Forgetfulness.   · Insomnia.   · Mood swings.   RISK INCREASES WITH:   · Family history of addiction.   · Personal history of addictive personality.   Studies have shown that risktakers, which many athletes are, have a higher risk of addiction.  PREVENTION  The only adequate prevention of drug abuse is abstinence from drugs.  TREATMENT   The first step in quitting substance abuse is recognizing the problem and realizing that one has the power to change. Quitting requires a plan and support from others. It is often necessary to seek medical assistance. Caregivers are available to offer counseling, and for certain cases, medicine to diminish the physical symptoms of withdrawal. Many organizations exist such as Alcoholics Anonymous, Narcotics Anonymous, or the National Council on Alcoholism that offer support for individuals who have chosen to quit their habits.  Document Released: 04/10/2005 Document Revised: 12/21/2010 Document Reviewed: 07/23/2008  [employer]® Patient Information ©2012 [employer].

## 2011-06-21 NOTE — ED Notes (Signed)
RN x 2 unable to obtain IV access. Pt sitting up in bed eating a cheeseburger and denies nausea. Notified MD of the same, orders for zofran and IVF cancelled. Pt currently awaiting lab results.  Family at bedside.

## 2011-07-14 ENCOUNTER — Telehealth: Payer: Self-pay | Admitting: Dietician

## 2011-07-14 NOTE — Telephone Encounter (Signed)
On 06-26-11 called patient @ phone number [phone]. Left her message asking her to give [place] a call back to schedule appointment with her pcp.

## 2011-07-26 ENCOUNTER — Emergency Department (HOSPITAL_COMMUNITY): Payer: Medicaid Other

## 2011-07-26 ENCOUNTER — Emergency Department (HOSPITAL_COMMUNITY)
Admission: EM | Admit: 2011-07-26 | Discharge: 2011-07-26 | Disposition: A | Discharge status: Home / Self Care | Payer: Medicaid Other | Attending: Emergency Medicine | Admitting: Emergency Medicine

## 2011-07-26 ENCOUNTER — Encounter (HOSPITAL_COMMUNITY): Payer: Self-pay

## 2011-07-26 DIAGNOSIS — E785 Hyperlipidemia, unspecified: Secondary | ICD-10-CM | POA: Insufficient documentation

## 2011-07-26 DIAGNOSIS — I10 Essential (primary) hypertension: Secondary | ICD-10-CM | POA: Insufficient documentation

## 2011-07-26 DIAGNOSIS — I2510 Atherosclerotic heart disease of native coronary artery without angina pectoris: Secondary | ICD-10-CM | POA: Insufficient documentation

## 2011-07-26 DIAGNOSIS — J189 Pneumonia, unspecified organism: Secondary | ICD-10-CM

## 2011-07-26 DIAGNOSIS — Z79899 Other long term (current) drug therapy: Secondary | ICD-10-CM | POA: Insufficient documentation

## 2011-07-26 DIAGNOSIS — R109 Unspecified abdominal pain: Secondary | ICD-10-CM | POA: Insufficient documentation

## 2011-07-26 DIAGNOSIS — J9801 Acute bronchospasm: Secondary | ICD-10-CM

## 2011-07-26 LAB — CBC
Hemoglobin: 9.4 g/dL — ABNORMAL LOW (ref 12.0–15.0)
Platelets: 582 10*3/uL — ABNORMAL HIGH (ref 150–400)
RBC: 4.75 MIL/uL (ref 3.87–5.11)
WBC: 15.5 10*3/uL — ABNORMAL HIGH (ref 4.0–10.5)

## 2011-07-26 MED ORDER — ALBUTEROL SULFATE (5 MG/ML) 0.5% IN NEBU
2.5000 mg | INHALATION_SOLUTION | Freq: Once | RESPIRATORY_TRACT | Status: AC
  Administered 2011-07-26: 2.5 mg via RESPIRATORY_TRACT
  Filled 2011-07-26: qty 1

## 2011-07-26 MED ORDER — HYDROCOD POLST-CHLORPHEN POLST 10-8 MG/5ML PO LQCR
5.0000 mL | Freq: Once | ORAL | Status: AC
  Administered 2011-07-26: 5 mL via ORAL
  Filled 2011-07-26: qty 5

## 2011-07-26 MED ORDER — CEFTRIAXONE SODIUM 1 G IJ SOLR: 1.0000 g | Freq: Once | INTRAMUSCULAR | Status: DC

## 2011-07-26 MED ORDER — AZITHROMYCIN 250 MG PO TABS: 250.0000 mg | ORAL_TABLET | Freq: Every day | ORAL | Status: DC

## 2011-07-26 MED ORDER — OXYCODONE-ACETAMINOPHEN 5-325 MG PO TABS: 1.0000 | ORAL_TABLET | Freq: Four times a day (QID) | ORAL | Status: AC | PRN

## 2011-07-26 MED ORDER — ALBUTEROL SULFATE HFA 108 (90 BASE) MCG/ACT IN AERS
2.0000 | INHALATION_SPRAY | Freq: Once | RESPIRATORY_TRACT | Status: AC
  Administered 2011-07-26: 2 via RESPIRATORY_TRACT
  Filled 2011-07-26: qty 6.7

## 2011-07-26 MED ORDER — OXYCODONE-ACETAMINOPHEN 5-325 MG PO TABS
1.0000 | ORAL_TABLET | Freq: Once | ORAL | Status: AC
  Administered 2011-07-26: 1 via ORAL
  Filled 2011-07-26: qty 1

## 2011-07-26 MED ORDER — DEXTROSE 5 % IV SOLN
1.0000 g | Freq: Once | INTRAVENOUS | Status: AC
  Administered 2011-07-26: 1 g via INTRAVENOUS

## 2011-07-26 MED ORDER — DEXTROSE 5 % IV SOLN
1.0000 g | Freq: Once | INTRAVENOUS | Status: DC
  Filled 2011-07-26: qty 10

## 2011-07-26 MED ORDER — ALBUTEROL SULFATE HFA 108 (90 BASE) MCG/ACT IN AERS
INHALATION_SPRAY | RESPIRATORY_TRACT | Status: AC
  Administered 2011-07-26: 08:00:00
  Filled 2011-07-26: qty 6.7

## 2011-07-26 MED ORDER — AZITHROMYCIN 250 MG PO TABS: 500.0000 mg | ORAL_TABLET | Freq: Every day | ORAL | Status: AC

## 2011-07-26 MED ORDER — IPRATROPIUM BROMIDE 0.02 % IN SOLN
0.5000 mg | Freq: Once | RESPIRATORY_TRACT | Status: AC
  Administered 2011-07-26: 0.5 mg via RESPIRATORY_TRACT
  Filled 2011-07-26: qty 2.5

## 2011-07-26 MED ORDER — AZITHROMYCIN 250 MG PO TABS
500.0000 mg | ORAL_TABLET | Freq: Once | ORAL | Status: AC
  Administered 2011-07-26: 500 mg via ORAL
  Filled 2011-07-26: qty 2

## 2011-07-26 MED ORDER — ALBUTEROL SULFATE HFA 108 (90 BASE) MCG/ACT IN AERS: 2.0000 | INHALATION_SPRAY | RESPIRATORY_TRACT | Status: DC | PRN

## 2011-07-26 MED ORDER — BENZONATATE 100 MG PO CAPS: 100.0000 mg | ORAL_CAPSULE | Freq: Three times a day (TID) | ORAL | Status: AC

## 2011-07-26 NOTE — ED Notes (Signed)
Another RN attempting to start a piv.

## 2011-07-26 NOTE — Discharge Instructions (Signed)
Bronchospasm, Adult Bronchospasm means that there is a spasm or tightening of the airways going into the lungs. Because the airways go into a spasm and get smaller it makes breathing more difficult. For reasons not completely known, workings (functions) of the airways designed to protect the lungs become over active. This causes the airways to become more sensitive to:  Infection.   Weather.   Exercise.   Irritants.   Things that cause allergic reactions or allergies (allergens).  Frequent coughing or respiratory episodes should be checked for the cause. This condition may be made worse by exercise. CAUSES  Inflammation is often the cause of this condition. Allergy, viral respiratory infections, or irritants in the air often cause this problem. Allergic reactions produce immediate and delayed responses. Late reactions may produce more serious inflammation. This may lead to increased reactivity of the airways. Sometimes this is inherited. Some common triggers are:  Allergies.   Infection commonly triggers attacks. Antibiotics are not helpful for viral infections and usually do not help with attacks of bronchospasm.   Exercise (running, etc.) can trigger an attack. Proper pre-exercise medications help most individuals participate in sports. Swimming is the least likely sport to cause problems.   Irritants (for example, pollution, cigarette smoke, strong odors, aerosol sprays, paint fumes, etc.) may trigger attacks. You cannot smoke and do not allow smoking in your home. This is absolutely necessary. Show this instruction to mates, relatives and significant others that may not agree with you.   Weather changes may cause lung problems but moving around trying to find an ideal climate does not seem to be overly helpful. Winds increase molds and pollens in the air. Rain refreshes the air by washing irritants out. Cold air may cause irritation.   Emotional problems do not cause lung problems but  can trigger attacks.  SYMPTOMS  Wheezing is the most common symptom. Frequent coughing (with or without exercise and or crying) and repeated respiratory infections are all early warning signs of bronchospasm. Chest tightness and shortness of breath are other symptoms. DIAGNOSIS  Early hidden bronchospasm may go for long periods of time without being detected. This is especially true if wheezing cannot be detected by your caregiver. Lung (pulmonary) function studies may help with diagnosis in these cases. HOME CARE INSTRUCTIONS   It is necessary to remain calm during an attack. Try to relax and breathe more slowly. During this time medications may be given. If any breathing problems seem to be getting worse and are unresponsive to treatment seek immediate medical care.   If you have severe breathing difficulty or have had a life threatening attack it is probably a good idea for you to learn how to give adrenaline (epi-pen) or use an anaphylaxis kit. Your caregiver can help you with this. These are the same kits carried by people who have severe allergic reactions. This is especially important if you do not have readily accessible medical care.   With any severe breathing problems where epinephrine (adrenaline) has been given at home call 911 immediately as the delayed reaction may be even more severe.  SEEK MEDICAL CARE IF:   There is wheezing and shortness of breath, even if medications are given to prevent attacks.   An oral temperature above 102 F (38.9 C) develops.   There are muscle aches, chest pain, or thickening of sputum.   The sputum changes from clear or white to yellow, green, gray, or bloody.   There are problems that may be related to   the medicine you are given, such as a rash, itching, swelling, or trouble breathing.  SEEK IMMEDIATE MEDICAL CARE IF:   The usual medicines do not stop your wheezing, or there is increased coughing.   You have increased difficulty breathing.    MAKE SURE YOU:   Understand these instructions.   Will watch your condition.   Will get help right away if you are not doing well or get worse.  Document Released: 04/13/2003 Document Revised: 03/30/2011 Document Reviewed: 11/27/2007 [facility] Patient Information 2012 [place], [place].Using Your Inhaler 1. Take the cap off the mouthpiece.  2. Shake the inhaler for 5 seconds.  3. Turn the inhaler so the bottle is above the mouthpiece. Hold it away from your mouth, at a distance of the width of 2 fingers.  4. Open your mouth widely, and tilt your head back slightly. Let your breath out.  5. Take a deep breath in slowly through your mouth. At the same time, push down on the bottle 1 time. You will feel the medicine enter your mouth and throat as you breathe.  6. Continue to take a deep breath in very slowly.  7. After you have breathed in completely, hold your breath for 10 seconds. This will help the medicine to settle in your lungs. If you cannot hold your breath for 10 seconds, hold it for as long as you can before you breathe out.  8. If your doctor has told you to take more than 1 puff, wait at least 1 minute between puffs. This will help you get the best results from your medicine.  9. If you use a steroid inhaler, rinse out your mouth after each dose.  10. Wash your inhaler once a day. Remove the bottle from the mouthpiece. Rinse the mouthpiece and cap with warm water. Dry everything well before you put the inhaler back together.  Document Released: 01/18/2008 Document Revised: 03/30/2011 Document Reviewed: 01/26/2009 [facility] Patient Information 2012 [place], [place].Using Your Inhaler 11. Take the cap off the mouthpiece.  12. Shake the inhaler for 5 seconds.  13. Turn the inhaler so the bottle is above the mouthpiece. Hold it away from your mouth, at a distance of the width of 2 fingers.  14. Open your mouth widely, and tilt your head back slightly. Let your breath out.  15. Take a deep  breath in slowly through your mouth. At the same time, push down on the bottle 1 time. You will feel the medicine enter your mouth and throat as you breathe.  16. Continue to take a deep breath in very slowly.  17. After you have breathed in completely, hold your breath for 10 seconds. This will help the medicine to settle in your lungs. If you cannot hold your breath for 10 seconds, hold it for as long as you can before you breathe out.  18. If your doctor has told you to take more than 1 puff, wait at least 1 minute between puffs. This will help you get the best results from your medicine.  19. If you use a steroid inhaler, rinse out your mouth after each dose.  20. Wash your inhaler once a day. Remove the bottle from the mouthpiece. Rinse the mouthpiece and cap with warm water. Dry everything well before you put the inhaler back together.  Document Released: 01/18/2008 Document Revised: 03/30/2011 Document Reviewed: 01/26/2009 [facility] Patient Information 2012 [place], [place]. As make appointment to followup with your primary care provider at outpatient clinic for early next  week

## 2011-07-26 NOTE — ED Notes (Signed)
Pt complains of a productive cough and congestion for about 4 days, today it feels like she's pulled a muscle in her back from coughing

## 2011-07-26 NOTE — ED Provider Notes (Signed)
Assumed patient care at her 20. Patient with history of asthma presents with severe cough for the past 4 days. Chest x-ray shows signs of  pneumonia. Patient was given Rocephin IV, and Zithromax. She is afebrile. Able to tolerate by mouth. Patient will be discharged with cough medication, pain medication for her back, and Z-Pak. I also instruct patient to follow with her primary care Dr. for repeat chest x-ray to ensure that a developing mass lesion is excluded. Patient was understanding and agrees with plan  Fayrene Helper, PA-C 07/26/11 4540

## 2011-07-26 NOTE — ED Notes (Signed)
Attempted to start a piv x 2 unsuccessfully.

## 2011-07-26 NOTE — ED Notes (Signed)
Patient transported to X-ray 

## 2011-07-26 NOTE — ED Provider Notes (Signed)
History     CSN: [id number]  Arrival date & time 07/26/11  0057   First MD Initiated Contact with Patient 07/26/11 0251      Chief Complaint  Patient presents with  . Cough  . Back Pain    (Consider location/radiation/quality/duration/timing/severity/associated sxs/prior treatment) HPI Comments: Patient with a history of asthma has been out of her inhaler for a while.  She reports 6 days of coughing that has become productive over the last 24 hours.  Denies fever.   Patient is a 36 y.o. female presenting with cough and back pain. The history is provided by the patient.  Cough This is a new problem. The current episode started more than 2 days ago. The cough is productive of sputum. There has been no fever. Associated symptoms include chest pain, shortness of breath and wheezing. Pertinent negatives include no chills.  Back Pain  Associated symptoms include chest pain. Pertinent negatives include no fever.    Past Medical History  Diagnosis Date  . Thrombocytosis     Hem/Onc suggested 2/2 chronic hepatits and/or iron deficiency anemia  . Iron deficiency anemia   . N&V (nausea and vomiting)     and  Abdominal pain, chronic. Unclear ethiology with multiple admission and ED visits. Work up including Abdominal xray showed mostly nonspecific bowel gas pattern, CT abdomen with and without contrast (02/2011)  showed no acute process. Gastic Emptying scan (01/2010) was normal. Ultrasound of the abdomen was within normal limits. Hepatitis B viral load was undectable. HIV nonreactive. EGD (11/09/  . Abdominal pain   . Hypertension   . Hyperlipidemia   . Diabetes mellitus   . Recurrent boils   . Back pain   . OSA (obstructive sleep apnea)   . 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 Cath 12/10- no restenosis or significant CAD progression  . Irregular menses     Small ovarian  follicles seen on CT(9/06)  . History of pyelonephritis     H/o GrpB Pyelonephritis (9/06) and UTI- 07/11- E.Coli, 12/10- GBS  . Abscess of tunica vaginalis     10/09- Abundant S. aureus- sensitive to all abx  . Obesity   . Asthma   . Gastritis   . Depression   . Hepatitis B, chronic     Hep BeAb+,Hep B cAb+ & Hep BsAg+ (9/06)  . Peripheral neuropathy   . Fibromyalgia   . GERD (gastroesophageal reflux disease)   . Migraine   . Anxiety   . Urinary tract infection   . Gastroparesis     secondary to poorly controlled DM, last emptying study performed 01/2010  was normal but may be falsely positive as pt was on reglan  . MI (myocardial infarction) 06/15/2006  . Blood dyscrasia     Past Surgical History  Procedure Date  . Cesarean section 1997    Family History  Problem Relation Age of Onset  . Diabetes Father     History  Substance Use Topics  . Smoking status: Former Smoker    Types: Cigarettes    Quit date: 04/24/1996  . Smokeless tobacco: Never Used   Comment: quit smoking cigarettes age 36  . Alcohol Use: Yes     03/31/11 "used marijuana last ~ 2 wk ago; only drink on occasions"    OB History    Grav Para Term Preterm Abortions TAB SAB Ect Mult Living  Review of Systems  Constitutional: Negative for fever and chills.  HENT: Negative for congestion.   Respiratory: Positive for cough, shortness of breath and wheezing.   Cardiovascular: Positive for chest pain.  Musculoskeletal: Positive for back pain.    Allergies  Lisinopril  Home Medications   Current Outpatient Rx  Name Route Sig Dispense Refill  . ASPIRIN 325 MG PO TABS Oral Take 325 mg by mouth daily.     Marland Kitchen CLONAZEPAM 0.5 MG PO TABS Oral Take 0.5 mg by mouth 3 (three) times daily as needed. For anxiety.    Di Kindle SULFATE 325 (65 FE) MG PO TABS Oral Take 325 mg by mouth daily with breakfast.    . FLUTICASONE-SALMETEROL 100-50 MCG/DOSE IN AEPB Inhalation Inhale 1 puff into the  lungs every 12 (twelve) hours.      Marland Kitchen HYOSCYAMINE SULFATE 0.125 MG PO TABS Oral Take 0.125 mg by mouth every 4 (four) hours as needed. cramping    . INSULIN GLARGINE 100 UNIT/ML [place] SOLN Subcutaneous Inject 40 Units into the skin at bedtime. 10 mL   . LABETALOL HCL 100 MG PO TABS Oral Take 0.5 tablets (50 mg total) by mouth 2 (two) times daily. 15 tablet 0  . OMEPRAZOLE 40 MG PO CPDR Oral Take 1 capsule (40 mg total) by mouth daily. 31 capsule 6  . PLAVIX 75 MG PO TABS  TAKE 1 TABLET BY MOUTH ONCE A DAY 31 tablet 0  . PROMETHAZINE HCL 25 MG PO TABS Oral Take 25 mg by mouth every 6 (six) hours as needed. nausea    . AZITHROMYCIN 250 MG PO TABS Oral Take 2 tablets (500 mg total) by mouth daily. 4 tablet 0  . BENZONATATE 100 MG PO CAPS Oral Take 1 capsule (100 mg total) by mouth every 8 (eight) hours. 21 capsule 0  . OXYCODONE-ACETAMINOPHEN 5-325 MG PO TABS Oral Take 1-2 tablets by mouth every 6 (six) hours as needed for pain. 20 tablet 0  . PROMETHAZINE HCL 25 MG PO TABS Oral Take 1 tablet (25 mg total) by mouth every 6 (six) hours as needed for nausea. 30 tablet 0    BP 127/90  Pulse 101  Temp(Src) 98.5 F (36.9 C) (Oral)  Resp 20  SpO2 99%  LMP 07/09/2011  Physical Exam  Constitutional: She appears well-developed and well-nourished.  HENT:  Head: Normocephalic.  Eyes: Pupils are equal, round, and reactive to light.  Neck: Normal range of motion.  Cardiovascular: Normal rate.   Pulmonary/Chest: Effort normal.  Abdominal: She exhibits no distension. There is tenderness in the right upper quadrant.  Musculoskeletal: Normal range of motion.  Neurological: She is alert.  Skin: Skin is warm.    ED Course  Procedures (including critical care time)  Labs Reviewed  CBC - Abnormal; Notable for the following:    WBC 15.5 (*)    Hemoglobin 9.4 (*)    HCT 30.1 (*)    MCV 63.4 (*)    MCH 19.8 (*)    RDW 18.9 (*)    Platelets 582 (*)    All other components within normal limits  LAB  REPORT - SCANNED   Dg Chest 2 View  07/26/2011  *RADIOLOGY REPORT*  Clinical Data: Shortness of breath and wheezing.  CHEST - 2 VIEW  Comparison: 02/21/2010  Findings: Interval development of a focal nodular area of infiltration in the left upper lung measuring about 3.6 cm diameter.  This may represent focal pneumonia although a developing mass lesion should  be excluded.  Follow up after treatment resolution of acute symptoms is recommended.  Shallow inspiration. Normal heart size and pulmonary vascularity.  No blunting of costophrenic angles.  No pneumothorax.  IMPRESSION: Focal developing mass or infiltration in the left upper lung.  Original Report Authenticated By: Marlon Pel, M.D.     1. Community acquired pneumonia   2. Bronchospasm       MDM  Asthma exacerbation        Arman Filter, NP 07/27/11 [phone]

## 2011-07-26 NOTE — ED Notes (Signed)
Pt reports having a severe cough for past 4 days. States that cough is productive with thick yellow mucus. States that she has taken OTC meds with no relief. States that her back hurts from the constant coughing. States back pain is 10/10. Pt reports SOB with coughing, as well. Bilateral breath sounds CTA. Pt denies smoking cigarettes.

## 2011-07-26 NOTE — ED Provider Notes (Signed)
Medical screening examination/treatment/procedure(s) were performed by non-physician practitioner and as supervising physician I was immediately available for consultation/collaboration.  Olivia Mackie, MD 07/26/11 [phone]

## 2011-07-27 NOTE — ED Provider Notes (Signed)
Medical screening examination/treatment/procedure(s) were performed by non-physician practitioner and as supervising physician I was immediately available for consultation/collaboration.  Sharilyn Geisinger M Amanpreet Delmont, MD 07/27/11 0613 

## 2011-09-25 ENCOUNTER — Emergency Department (HOSPITAL_COMMUNITY)
Admission: EM | Admit: 2011-09-25 | Discharge: 2011-09-26 | Disposition: A | Discharge status: Home / Self Care | Payer: Self-pay | Attending: Emergency Medicine | Admitting: Emergency Medicine

## 2011-09-25 ENCOUNTER — Encounter (HOSPITAL_COMMUNITY): Payer: Self-pay | Admitting: *Deleted

## 2011-09-25 DIAGNOSIS — R112 Nausea with vomiting, unspecified: Secondary | ICD-10-CM | POA: Insufficient documentation

## 2011-09-25 DIAGNOSIS — Z79899 Other long term (current) drug therapy: Secondary | ICD-10-CM | POA: Insufficient documentation

## 2011-09-25 DIAGNOSIS — I2510 Atherosclerotic heart disease of native coronary artery without angina pectoris: Secondary | ICD-10-CM | POA: Insufficient documentation

## 2011-09-25 DIAGNOSIS — I10 Essential (primary) hypertension: Secondary | ICD-10-CM | POA: Insufficient documentation

## 2011-09-25 DIAGNOSIS — R1032 Left lower quadrant pain: Secondary | ICD-10-CM | POA: Insufficient documentation

## 2011-09-25 DIAGNOSIS — D72829 Elevated white blood cell count, unspecified: Secondary | ICD-10-CM | POA: Insufficient documentation

## 2011-09-25 DIAGNOSIS — R109 Unspecified abdominal pain: Secondary | ICD-10-CM

## 2011-09-25 DIAGNOSIS — I252 Old myocardial infarction: Secondary | ICD-10-CM | POA: Insufficient documentation

## 2011-09-25 DIAGNOSIS — E119 Type 2 diabetes mellitus without complications: Secondary | ICD-10-CM | POA: Insufficient documentation

## 2011-09-25 DIAGNOSIS — IMO0001 Reserved for inherently not codable concepts without codable children: Secondary | ICD-10-CM | POA: Insufficient documentation

## 2011-09-25 DIAGNOSIS — E785 Hyperlipidemia, unspecified: Secondary | ICD-10-CM | POA: Insufficient documentation

## 2011-09-25 LAB — POCT PREGNANCY, URINE: Preg Test, Ur: NEGATIVE

## 2011-09-25 MED ORDER — METOCLOPRAMIDE HCL 5 MG/ML IJ SOLN
10.0000 mg | Freq: Once | INTRAMUSCULAR | Status: AC
  Administered 2011-09-26: 10 mg via INTRAVENOUS
  Filled 2011-09-25: qty 2

## 2011-09-25 MED ORDER — DIPHENHYDRAMINE HCL 50 MG/ML IJ SOLN
50.0000 mg | Freq: Once | INTRAMUSCULAR | Status: AC
  Administered 2011-09-26: 50 mg via INTRAVENOUS
  Filled 2011-09-25: qty 1

## 2011-09-25 MED ORDER — SODIUM CHLORIDE 0.9 % IV SOLN: 1000.0000 mL | INTRAVENOUS | Status: DC

## 2011-09-25 MED ORDER — LORAZEPAM 2 MG/ML IJ SOLN
1.0000 mg | Freq: Once | INTRAMUSCULAR | Status: AC
  Administered 2011-09-26: 1 mg via INTRAVENOUS
  Filled 2011-09-25: qty 1

## 2011-09-25 MED ORDER — ONDANSETRON HCL 4 MG/2ML IJ SOLN
INTRAMUSCULAR | Status: AC
  Filled 2011-09-25: qty 2

## 2011-09-25 NOTE — ED Notes (Signed)
[facility] pt diagnosed with stomach pain a year ago. Refusing CA treatment at the [facility] center. Ran out of percocet and phenergan a month ago. Pain started this morning at 1am. Has not eaten anything in 2 days.

## 2011-09-25 NOTE — ED Notes (Signed)
[id number]<AV> Expected date:<BR> Expected time: 9:41 PM<BR> Means of arrival:<BR> Comments:<BR> M231 - 35yoF Sharp abd pain, CA Pt *Wheelchair-capable*

## 2011-09-26 LAB — URINALYSIS, ROUTINE W REFLEX MICROSCOPIC
Glucose, UA: >1000 mg/dL — AB
Hgb urine dipstick: NEGATIVE
Ketones, ur: 40 mg/dL — AB
Protein, ur: 30 mg/dL — AB
Urobilinogen, UA: 1 mg/dL (ref 0.0–1.0)

## 2011-09-26 LAB — DIFFERENTIAL
Eosinophils Relative: 1 % (ref 0–5)
Lymphocytes Relative: 13 % (ref 12–46)
Lymphs Abs: 2.2 10*3/uL (ref 0.7–4.0)
Monocytes Absolute: 0.7 10*3/uL (ref 0.1–1.0)
Neutro Abs: 13.6 10*3/uL — ABNORMAL HIGH (ref 1.7–7.7)

## 2011-09-26 LAB — COMPREHENSIVE METABOLIC PANEL
ALT: <5 U/L (ref 0–35)
CO2: 22 mEq/L (ref 19–32)
Calcium: 9.7 mg/dL (ref 8.4–10.5)
Chloride: 97 mEq/L (ref 96–112)
Creatinine, Ser: 0.61 mg/dL (ref 0.50–1.10)
GFR calc Af Amer: >90 mL/min (ref >90)
GFR calc non Af Amer: >90 mL/min (ref >90)
Glucose, Bld: 274 mg/dL — ABNORMAL HIGH (ref 70–99)
Total Bilirubin: 0.5 mg/dL (ref 0.3–1.2)

## 2011-09-26 LAB — CBC
HCT: 35.1 % — ABNORMAL LOW (ref 36.0–46.0)
Hemoglobin: 11.5 g/dL — ABNORMAL LOW (ref 12.0–15.0)
MCV: 63.4 fL — ABNORMAL LOW (ref 78.0–100.0)
RBC: 5.54 MIL/uL — ABNORMAL HIGH (ref 3.87–5.11)
WBC: 16.6 10*3/uL — ABNORMAL HIGH (ref 4.0–10.5)

## 2011-09-26 LAB — URINE MICROSCOPIC-ADD ON

## 2011-09-26 MED ORDER — SODIUM CHLORIDE 0.9 % IV SOLN
1000.0000 mL | INTRAVENOUS | Status: DC
  Administered 2011-09-26: 1000 mL via INTRAVENOUS

## 2011-09-26 MED ORDER — PROMETHAZINE HCL 25 MG/ML IJ SOLN
25.0000 mg | Freq: Once | INTRAMUSCULAR | Status: AC
  Administered 2011-09-26: 25 mg via INTRAVENOUS
  Filled 2011-09-26: qty 1

## 2011-09-26 MED ORDER — SODIUM CHLORIDE 0.9 % IV SOLN
1000.0000 mL | Freq: Once | INTRAVENOUS | Status: AC
  Administered 2011-09-26: 1000 mL via INTRAVENOUS

## 2011-09-26 MED ORDER — DICYCLOMINE HCL 10 MG/ML IM SOLN
20.0000 mg | Freq: Once | INTRAMUSCULAR | Status: AC
  Administered 2011-09-26: 20 mg via INTRAMUSCULAR
  Filled 2011-09-26: qty 2

## 2011-09-26 MED ORDER — PROMETHAZINE HCL 25 MG PO TABS: 25.0000 mg | ORAL_TABLET | Freq: Four times a day (QID) | ORAL | Status: DC | PRN

## 2011-09-26 MED ORDER — PROMETHAZINE HCL 25 MG RE SUPP: 25.0000 mg | Freq: Four times a day (QID) | RECTAL | Status: DC | PRN

## 2011-09-26 MED ORDER — OXYCODONE-ACETAMINOPHEN 5-325 MG PO TABS: 2.0000 | ORAL_TABLET | ORAL | Status: AC | PRN

## 2011-09-26 MED ORDER — MORPHINE SULFATE 4 MG/ML IJ SOLN
6.0000 mg | Freq: Once | INTRAMUSCULAR | Status: AC
  Administered 2011-09-26: 6 mg via INTRAVENOUS
  Filled 2011-09-26: qty 2

## 2011-09-26 NOTE — ED Provider Notes (Signed)
History     CSN: [id number]  Arrival date & time 09/25/11  2140   First MD Initiated Contact with Patient 09/25/11 2303      Chief Complaint  Patient presents with  . Abdominal Pain  . Nausea    (Consider location/radiation/quality/duration/timing/severity/associated sxs/prior treatment) HPI 36 year old female presents to the emergency department with complaint of left lower quadrant abdominal pain, nausea vomiting. Patient reports onset of symptoms Sunday 1 AM. She denies any fever. She denies any change in her bowel habits. Patient with history of chronic issues of abdominal pain. She reports she has a history of H. pylori, has had been treated several times. She was supposed to have a workup for possible cancer but never followed up. Patient does not have cancer diagnosis at this time. Patient is followed by outpatient clinics. She has a history of diabetes. Review of record shows questionable gastroparesis. Patient no longer takes her Reglan. She reports she has been taking her insulin, but has not been checking her sugars "I\'ve been feeling too bad". Patient has had significant workup in the past for this, last admission in December with referral to GI. Patient reports she has not yet followed up with them.  Past Medical History  Diagnosis Date  . Thrombocytosis     Hem/Onc suggested 2/2 chronic hepatits and/or iron deficiency anemia  . Iron deficiency anemia   . N&V (nausea and vomiting)     and  Abdominal pain, chronic. Unclear ethiology with multiple admission and ED visits. Work up including Abdominal xray showed mostly nonspecific bowel gas pattern, CT abdomen with and without contrast (02/2011)  showed no acute process. Gastic Emptying scan (01/2010) was normal. Ultrasound of the abdomen was within normal limits. Hepatitis B viral load was undectable. HIV nonreactive. EGD (11/09/  . Abdominal pain   . Hypertension   . Hyperlipidemia   . Diabetes mellitus   . Recurrent boils     . Back pain   . OSA (obstructive sleep apnea)   . 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 Cath 12/10- no restenosis or significant CAD progression  . Irregular menses     Small ovarian follicles seen on CT(9/06)  . History of pyelonephritis     H/o GrpB Pyelonephritis (9/06) and UTI- 07/11- E.Coli, 12/10- GBS  . Abscess of tunica vaginalis     10 /09- Abundant S. aureus- sensitive to all abx  . Obesity   . Asthma   . Gastritis   . Depression   . Hepatitis B, chronic     Hep BeAb+,Hep B cAb+ & Hep BsAg+ (9/06)  . Peripheral neuropathy   . Fibromyalgia   . GERD (gastroesophageal reflux disease)   . Migraine   . Anxiety   . Urinary tract infection   . Gastroparesis     secondary to poorly controlled DM, last emptying study performed 01/2010  was normal but may be falsely positive as pt was on reglan  . MI (myocardial infarction) 06/15/2006  . Blood dyscrasia     Past Surgical History  Procedure Date  . Cesarean section 1997    Family History  Problem Relation Age of Onset  . Diabetes Father     History  Substance Use Topics  . Smoking status: Former Smoker    Types: Cigarettes    Quit date: 04/24/1996  . Smokeless tobacco: Never Used   Comment: quit smoking  cigarettes age 64  . Alcohol Use: Yes     03/31/11 "used marijuana last ~ 2 wk ago; only drink on occasions"    OB History    Grav Para Term Preterm Abortions TAB SAB Ect Mult Living                  Review of Systems  All other systems reviewed and are negative.    Allergies  Lisinopril  Home Medications   Current Outpatient Rx  Name Route Sig Dispense Refill  . ASPIRIN 325 MG PO TABS Oral Take 325 mg by mouth daily.     Marland Kitchen CLONAZEPAM 0.5 MG PO TABS Oral Take 0.5 mg by mouth 3 (three) times daily as needed. For anxiety.    Di Kindle SULFATE 325 (65 FE) MG PO TABS Oral Take 325 mg by mouth daily  with breakfast.    . FLUTICASONE-SALMETEROL 100-50 MCG/DOSE IN AEPB Inhalation Inhale 1 puff into the lungs every 12 (twelve) hours.      Marland Kitchen HYOSCYAMINE SULFATE 0.125 MG PO TABS Oral Take 0.125 mg by mouth every 4 (four) hours as needed. cramping    . INSULIN GLARGINE 100 UNIT/ML [place] SOLN Subcutaneous Inject 40 Units into the skin at bedtime. 10 mL   . LABETALOL HCL 100 MG PO TABS Oral Take 0.5 tablets (50 mg total) by mouth 2 (two) times daily. 15 tablet 0  . OMEPRAZOLE 40 MG PO CPDR Oral Take 1 capsule (40 mg total) by mouth daily. 31 capsule 6  . PLAVIX 75 MG PO TABS  TAKE 1 TABLET BY MOUTH ONCE A DAY 31 tablet 0  . PROMETHAZINE HCL 25 MG PO TABS Oral Take 25 mg by mouth every 6 (six) hours as needed. nausea    . PROMETHAZINE HCL 25 MG PO TABS Oral Take 1 tablet (25 mg total) by mouth every 6 (six) hours as needed for nausea. 30 tablet 0    BP 139/95  Pulse 108  Temp(Src) 98.8 F (37.1 C) (Oral)  Resp 21  SpO2 97%  Physical Exam  Nursing note and vitals reviewed. Constitutional: She appears distressed.       Morbid obese female, lying face down the bed, jiggling up and down moaning. During history of present illness, patient able to give a full history. After obtaining history of present illness, patient with loud retching and producing small amounts of yellow bile.  HENT:  Head: Normocephalic and atraumatic.  Neck: Normal range of motion. Neck supple. No JVD present. No tracheal deviation present. No thyromegaly present.  Cardiovascular: Normal rate, regular rhythm, normal heart sounds and intact distal pulses.  Exam reveals no gallop and no friction rub.   No murmur heard. Pulmonary/Chest: Effort normal and breath sounds normal. No stridor. No respiratory distress. She has no wheezes. She has no rales. She exhibits no tenderness.  Abdominal: Soft. Bowel sounds are normal. She exhibits no distension and no mass. There is tenderness (tenderness with any palpation of left lower quadrantno  referred pain no pain with shaking of the pelvic bones or abdomen). There is no rebound and no guarding.  Musculoskeletal: Normal range of motion. She exhibits no edema and no tenderness.  Lymphadenopathy:    She has no cervical adenopathy.  Skin: Skin is warm and dry. No rash noted. No erythema. No pallor.  Psychiatric:       Patient agitated, flat affect noted    ED Course  Procedures (including critical care time)  Labs Reviewed  CBC -  Abnormal; Notable for the following:    WBC 16.6 (*)    RBC 5.54 (*)    Hemoglobin 11.5 (*)    HCT 35.1 (*)    MCV 63.4 (*)    MCH 20.8 (*)    RDW 17.8 (*)    Platelets 466 (*)    All other components within normal limits  DIFFERENTIAL - Abnormal; Notable for the following:    Neutrophils Relative 82 (*)    Neutro Abs 13.6 (*)    All other components within normal limits  COMPREHENSIVE METABOLIC PANEL - Abnormal; Notable for the following:    Sodium 133 (*)    Glucose, Bld 274 (*)    Total Protein 8.7 (*)    All other components within normal limits  URINALYSIS, ROUTINE W REFLEX MICROSCOPIC - Abnormal; Notable for the following:    APPearance TURBID (*)    Specific Gravity, Urine 1.037 (*)    Glucose, UA >1000 (*)    Ketones, ur 40 (*)    Protein, ur 30 (*)    Leukocytes, UA TRACE (*)    All other components within normal limits  URINE MICROSCOPIC-ADD ON - Abnormal; Notable for the following:    Squamous Epithelial / LPF MANY (*)    Bacteria, UA MANY (*)    All other components within normal limits  LIPASE, BLOOD  POCT PREGNANCY, URINE   No results found.   1. Abdominal pain   2. Nausea and vomiting   3. Leukocytosis       MDM  36 year-old with acute on chronic abdominal pain. Patient has been well managed with her abdominal pain over the last 6 months. We'll check labs. Of note patient labs reviewed from past visits and normally has leukocytosis. We'll check for possible DKA. Patient with CT scan that was normal in  November, 7 months ago. Will hold on CT scan on this visit if we are able to get her to feel better.     2:39 AM Patient's labs show leukocytosis and elevated blood glucose. Patient reports persistent vomiting, and actually has vomiting after I left or room after reassessing her. No change in her abdominal pain. Will try a different regimen of medications. I am hoping to get her symptoms under control for followup outpatient with her doctor.  5:42 AM [facility] with no further vomiting. She reports her nausea is better, but she does have some abdominal pain remaining. I explained to patient that we were not expecting to take all of the pain away. Patient noted to have leukocytosis but no focal infection noted. Do not feel patient needs CT scan at this time due to chronic nature of her bowel pain and recent CTs within the last year for same condition. Patient understands she is to followup with her doctor for recheck within a week. She has been given precautions for return  Olivia Mackie, MD 09/26/11 [phone]

## 2011-09-26 NOTE — Discharge Instructions (Signed)
Please take medications as prescribed. Call the outpatient clinics later today to schedule a followup appointment. Return to the emergency department for fevers, persistent vomiting despite medication, worsening abdominal pain, or new concerning symptoms.

## 2011-09-27 ENCOUNTER — Emergency Department (HOSPITAL_COMMUNITY)
Admission: EM | Admit: 2011-09-27 | Discharge: 2011-09-28 | Disposition: A | Discharge status: Home / Self Care | Payer: Self-pay | Attending: Emergency Medicine | Admitting: Emergency Medicine

## 2011-09-27 DIAGNOSIS — I252 Old myocardial infarction: Secondary | ICD-10-CM | POA: Insufficient documentation

## 2011-09-27 DIAGNOSIS — I2510 Atherosclerotic heart disease of native coronary artery without angina pectoris: Secondary | ICD-10-CM | POA: Insufficient documentation

## 2011-09-27 DIAGNOSIS — I10 Essential (primary) hypertension: Secondary | ICD-10-CM | POA: Insufficient documentation

## 2011-09-27 DIAGNOSIS — R112 Nausea with vomiting, unspecified: Secondary | ICD-10-CM | POA: Insufficient documentation

## 2011-09-27 DIAGNOSIS — Z8619 Personal history of other infectious and parasitic diseases: Secondary | ICD-10-CM | POA: Insufficient documentation

## 2011-09-27 DIAGNOSIS — E119 Type 2 diabetes mellitus without complications: Secondary | ICD-10-CM | POA: Insufficient documentation

## 2011-09-27 DIAGNOSIS — R109 Unspecified abdominal pain: Secondary | ICD-10-CM | POA: Insufficient documentation

## 2011-09-27 DIAGNOSIS — Z794 Long term (current) use of insulin: Secondary | ICD-10-CM | POA: Insufficient documentation

## 2011-09-27 DIAGNOSIS — G8929 Other chronic pain: Secondary | ICD-10-CM | POA: Insufficient documentation

## 2011-09-27 DIAGNOSIS — D72829 Elevated white blood cell count, unspecified: Secondary | ICD-10-CM | POA: Insufficient documentation

## 2011-09-27 DIAGNOSIS — R10814 Left lower quadrant abdominal tenderness: Secondary | ICD-10-CM | POA: Insufficient documentation

## 2011-09-27 LAB — DIFFERENTIAL
Basophils Relative: 0 % (ref 0–1)
Eosinophils Absolute: 0 10*3/uL (ref 0.0–0.7)
Lymphs Abs: 2.3 10*3/uL (ref 0.7–4.0)
Monocytes Absolute: 1 10*3/uL (ref 0.1–1.0)
Neutro Abs: 13.2 10*3/uL — ABNORMAL HIGH (ref 1.7–7.7)

## 2011-09-27 LAB — CBC
HCT: 33.8 % — ABNORMAL LOW (ref 36.0–46.0)
Hemoglobin: 11.1 g/dL — ABNORMAL LOW (ref 12.0–15.0)
MCH: 20.9 pg — ABNORMAL LOW (ref 26.0–34.0)
MCHC: 32.8 g/dL (ref 30.0–36.0)
MCV: 63.5 fL — ABNORMAL LOW (ref 78.0–100.0)
RDW: 18.1 % — ABNORMAL HIGH (ref 11.5–15.5)

## 2011-09-27 LAB — BASIC METABOLIC PANEL
BUN: 12 mg/dL (ref 6–23)
Calcium: 9.3 mg/dL (ref 8.4–10.5)
Creatinine, Ser: 0.64 mg/dL (ref 0.50–1.10)
GFR calc non Af Amer: >90 mL/min (ref >90)
Glucose, Bld: 248 mg/dL — ABNORMAL HIGH (ref 70–99)
Potassium: 3.6 mEq/L (ref 3.5–5.1)

## 2011-09-27 LAB — URINE MICROSCOPIC-ADD ON

## 2011-09-27 LAB — URINALYSIS, ROUTINE W REFLEX MICROSCOPIC
Bilirubin Urine: NEGATIVE
Hgb urine dipstick: NEGATIVE
Ketones, ur: >80 mg/dL — AB
Specific Gravity, Urine: 1.034 — ABNORMAL HIGH (ref 1.005–1.030)
Urobilinogen, UA: 1 mg/dL (ref 0.0–1.0)

## 2011-09-27 MED ORDER — LORAZEPAM 2 MG/ML IJ SOLN
1.0000 mg | Freq: Once | INTRAMUSCULAR | Status: AC
  Administered 2011-09-27: 1 mg via INTRAVENOUS
  Filled 2011-09-27: qty 1

## 2011-09-27 MED ORDER — ONDANSETRON HCL 4 MG/2ML IJ SOLN
INTRAMUSCULAR | Status: AC
  Filled 2011-09-27: qty 2

## 2011-09-27 MED ORDER — PROMETHAZINE HCL 25 MG/ML IJ SOLN
12.5000 mg | Freq: Once | INTRAMUSCULAR | Status: AC
  Administered 2011-09-28: 12.5 mg via INTRAVENOUS
  Filled 2011-09-27: qty 1

## 2011-09-27 MED ORDER — ONDANSETRON HCL 4 MG/2ML IJ SOLN
4.0000 mg | Freq: Once | INTRAMUSCULAR | Status: AC
  Administered 2011-09-27: 4 mg via INTRAVENOUS
  Filled 2011-09-27: qty 2

## 2011-09-27 MED ORDER — HYDROMORPHONE HCL PF 1 MG/ML IJ SOLN
1.0000 mg | Freq: Once | INTRAMUSCULAR | Status: AC
  Administered 2011-09-27: 1 mg via INTRAVENOUS
  Filled 2011-09-27: qty 1

## 2011-09-27 MED ORDER — METOCLOPRAMIDE HCL 5 MG/ML IJ SOLN
10.0000 mg | Freq: Once | INTRAMUSCULAR | Status: AC
  Administered 2011-09-27: 10 mg via INTRAVENOUS
  Filled 2011-09-27: qty 2

## 2011-09-27 MED ORDER — DICYCLOMINE HCL 10 MG PO CAPS
10.0000 mg | ORAL_CAPSULE | Freq: Once | ORAL | Status: AC
  Administered 2011-09-27: 10 mg via ORAL
  Filled 2011-09-27: qty 1

## 2011-09-27 MED ORDER — MORPHINE SULFATE 4 MG/ML IJ SOLN
4.0000 mg | Freq: Once | INTRAMUSCULAR | Status: AC
  Administered 2011-09-27: 4 mg via INTRAVENOUS
  Filled 2011-09-27: qty 1

## 2011-09-27 MED ORDER — SODIUM CHLORIDE 0.9 % IV BOLUS (SEPSIS)
1000.0000 mL | Freq: Once | INTRAVENOUS | Status: AC
  Administered 2011-09-27: 1000 mL via INTRAVENOUS

## 2011-09-27 NOTE — ED Notes (Signed)
PA Van Wingen at bedside. 

## 2011-09-27 NOTE — ED Notes (Signed)
Pt still unable to urinate at this time 

## 2011-09-27 NOTE — ED Notes (Signed)
Pt unable to urinate at this time.  

## 2011-09-27 NOTE — ED Provider Notes (Signed)
History     CSN: [id number]  Arrival date & time 09/27/11  1859   First MD Initiated Contact with Patient 09/27/11 2007      Chief Complaint  Patient presents with  . Abdominal Pain    (Consider location/radiation/quality/duration/timing/severity/associated sxs/prior treatment) HPI Comments: Patient comes in today with a chief complaint of abdominal pain.  She reports that she has had this abdominal pain for the past year.  She has been seen in the ED multiple times for the same presentation.  She was seen in the ED two days ago.  She reports that the pain that she is having today is no different than the pain that she has had in the past.  She has seen GI in the past for this and has had a thorough work up without any cause of the abdominal pain identified.  She does not have a history of stomach cancer.  That has never been diagnosed.  She has had two CT scans of her abdomen in the past year, which have not showed an identifiable cause for her abdominal pain.  She denies any fever or chills.  She reports that she has not followed up with GI as she has been instructed to in the past.   Patient is a 36 y.o. female presenting with abdominal pain. The history is provided by the patient.  Abdominal Pain The primary symptoms of the illness include abdominal pain, nausea and vomiting. The primary symptoms of the illness do not include fever, shortness of breath, diarrhea, dysuria or vaginal discharge.  Symptoms associated with the illness do not include chills, constipation or hematuria.    Past Medical History  Diagnosis Date  . Thrombocytosis     Hem/Onc suggested 2/2 chronic hepatits and/or iron deficiency anemia  . Iron deficiency anemia   . N&V (nausea and vomiting)     and  Abdominal pain, chronic. Unclear ethiology with multiple admission and ED visits. Work up including Abdominal xray showed mostly nonspecific bowel gas pattern, CT abdomen with and without contrast (02/2011)  showed no  acute process. Gastic Emptying scan (01/2010) was normal. Ultrasound of the abdomen was within normal limits. Hepatitis B viral load was undectable. HIV nonreactive. EGD (11/09/  . Abdominal pain   . Hypertension   . Hyperlipidemia   . Diabetes mellitus   . Recurrent boils   . Back pain   . OSA (obstructive sleep apnea)   . 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 Cath 12/10- no restenosis or significant CAD progression  . Irregular menses     Small ovarian follicles seen on CT(9/06)  . History of pyelonephritis     H/o GrpB Pyelonephritis (9/06) and UTI- 07/11- E.Coli, 12/10- GBS  . Abscess of tunica vaginalis     10/09- Abundant S. aureus- sensitive to all abx  . Obesity   . Asthma   . Gastritis   . Depression   . Hepatitis B, chronic     Hep BeAb+,Hep B cAb+ & Hep BsAg+ (9/06)  . Peripheral neuropathy   . Fibromyalgia   . GERD (gastroesophageal reflux disease)   . Migraine   . Anxiety   . Urinary tract infection   . Gastroparesis     secondary to poorly controlled DM, last emptying study performed 01/2010  was normal but may be falsely positive as pt was on reglan  . MI (  myocardial infarction) 06/15/2006  . Blood dyscrasia     Past Surgical History  Procedure Date  . Cesarean section 1997    Family History  Problem Relation Age of Onset  . Diabetes Father     History  Substance Use Topics  . Smoking status: Former Smoker    Types: Cigarettes    Quit date: 04/24/1996  . Smokeless tobacco: Never Used   Comment: quit smoking cigarettes age 89  . Alcohol Use: Yes     03/31/11 "used marijuana last ~ 2 wk ago; only drink on occasions"    OB History    Grav Para Term Preterm Abortions TAB SAB Ect Mult Living                  Review of Systems  Constitutional: Negative for fever and chills.  Respiratory: Negative for shortness of breath.   Cardiovascular:  Negative for chest pain.  Gastrointestinal: Positive for nausea, vomiting and abdominal pain. Negative for diarrhea, constipation and blood in stool.  Genitourinary: Negative for dysuria, hematuria, flank pain, decreased urine volume, vaginal discharge and pelvic pain.  Neurological: Negative for dizziness, syncope and light-headedness.    Allergies  Lisinopril  Home Medications   Current Outpatient Rx  Name Route Sig Dispense Refill  . ASPIRIN 325 MG PO TABS Oral Take 325 mg by mouth daily.     Marland Kitchen CLONAZEPAM 0.5 MG PO TABS Oral Take 0.5 mg by mouth 3 (three) times daily as needed. For anxiety.    Di Kindle SULFATE 325 (65 FE) MG PO TABS Oral Take 325 mg by mouth daily with breakfast.    . FLUTICASONE-SALMETEROL 100-50 MCG/DOSE IN AEPB Inhalation Inhale 1 puff into the lungs every 12 (twelve) hours.      Marland Kitchen HYOSCYAMINE SULFATE 0.125 MG PO TABS Oral Take 0.125 mg by mouth every 4 (four) hours as needed. cramping    . INSULIN GLARGINE 100 UNIT/ML [place] SOLN Subcutaneous Inject 40 Units into the skin at bedtime. 10 mL   . LABETALOL HCL 100 MG PO TABS Oral Take 0.5 tablets (50 mg total) by mouth 2 (two) times daily. 15 tablet 0  . OMEPRAZOLE 40 MG PO CPDR Oral Take 1 capsule (40 mg total) by mouth daily. 31 capsule 6  . OXYCODONE-ACETAMINOPHEN 5-325 MG PO TABS Oral Take 2 tablets by mouth every 4 (four) hours as needed for pain. 20 tablet 0  . PLAVIX 75 MG PO TABS  TAKE 1 TABLET BY MOUTH ONCE A DAY 31 tablet 0  . PROMETHAZINE HCL 25 MG RE SUPP Rectal Place 1 suppository (25 mg total) rectally every 6 (six) hours as needed for nausea. 12 each 0  . PROMETHAZINE HCL 25 MG PO TABS Oral Take 1 tablet (25 mg total) by mouth every 6 (six) hours as needed for nausea. 30 tablet 0    BP 129/113  Pulse 101  Temp(Src) 98.5 F (36.9 C) (Oral)  Resp 19  SpO2 100%  Physical Exam  Nursing note and vitals reviewed. Constitutional: She appears well-developed and well-nourished.       Uncomfortable  appearing  HENT:  Head: Normocephalic and atraumatic.  Mouth/Throat: Oropharynx is clear and moist.  Cardiovascular: Normal rate, regular rhythm and normal heart sounds.   Pulmonary/Chest: Effort normal and breath sounds normal. No respiratory distress. She has no wheezes. She has no rales.  Abdominal: Soft. Bowel sounds are normal. She exhibits no distension and no mass. There is tenderness in the left lower quadrant. There is  no rigidity, no rebound, no guarding, no CVA tenderness, no tenderness at McBurney's point and negative Murphy's sign.  Neurological: She is alert.  Skin: Skin is warm and dry.  Psychiatric: She has a normal mood and affect.    ED Course  Procedures (including critical care time)   Labs Reviewed  CBC  DIFFERENTIAL  BASIC METABOLIC PANEL  URINALYSIS, ROUTINE W REFLEX MICROSCOPIC  PREGNANCY, URINE   No results found.   No diagnosis found.  10:22 PM Reassessed patient.  She reports that she is still having abdominal pain and nausea.  No active vomiting. 12:48 AM Reassessed patient.  Nausea and abdominal pain has improved.  Feel that patient can be discharged home.  MDM  Patient with chronic abdominal pain for the past year.  Pain no different today than it has been in the past.  No rebound or guarding on exam.  Patient afebrile.  Patient does have leukocytosis, but review of the chart shows that her WBC is typically elevated.  Symptoms improved after treatment.  Patient instructed to follow up with GI.        Pascal Lux Bronson, PA-C 10/01/11 2153

## 2011-09-27 NOTE — ED Notes (Signed)
EMS brings pt from home, pt co of vomiting beginning today several times and abdominal pain, EMS reports history stomach cancer scene here 2 days ago for same symptoms

## 2011-09-27 NOTE — ED Notes (Signed)
[id number]<JY> Expected date:09/27/11<BR> Expected time:<BR> Means of arrival:<BR> Comments:<BR> EMS 110 GC = vomiting/stomach cancer

## 2011-09-28 MED ORDER — HYDROCODONE-ACETAMINOPHEN 5-325 MG PO TABS: 2.0000 | ORAL_TABLET | ORAL | Status: AC | PRN

## 2011-09-28 MED ORDER — PROMETHAZINE HCL 25 MG PO TABS: 25.0000 mg | ORAL_TABLET | Freq: Four times a day (QID) | ORAL | Status: DC | PRN

## 2011-09-28 NOTE — ED Notes (Signed)
PA Van Wingen at bedside. 

## 2011-09-28 NOTE — Discharge Instructions (Signed)
Follow up with your Primary Care Physician. Only use your pain medication for severe pain. Do not operate heavy machinery while on pain medication or muscle relaxer. Note that your pain medication contains acetaminophen (Tylenol) & its is not recommended that you use additional acetaminophen (Tylenol) while taking this medication.   Return to the Emergency Department if your pain changes or worsens.  Abdominal Pain  Your exam might not show the exact reason you have abdominal pain. Since there are many different causes of abdominal pain, another checkup and more tests may be needed. It is very important to follow up for lasting (persistent) or worsening symptoms. A possible cause of abdominal pain in any person who still has his or her appendix is acute appendicitis. Appendicitis is often hard to diagnose. Normal blood tests, urine tests, ultrasound, and CT scans do not completely rule out early appendicitis or other causes of abdominal pain. Sometimes, only the changes that happen over time will allow appendicitis and other causes of abdominal pain to be determined. Other potential problems that may require surgery may also take time to become more apparent. Because of this, it is important that you follow all of the instructions below.   HOME CARE INSTRUCTIONS  Do not take laxatives unless directed by your caregiver. Rest as much as possible.  Do not eat solid food until your pain is gone: A diet of water, weak decaffeinated tea, broth or bouillon, gelatin, oral rehydration solutions (ORS), frozen ice pops, or ice chips may be helpful.  When pain is gone: Start a light diet (dry toast, crackers, applesauce, or white rice). Increase the diet slowly as long as it does not bother you. Eat no dairy products (including cheese and eggs) and no spicy, fatty, fried, or high-fiber foods.  Use no alcohol, caffeine, or cigarettes.  Take your regular medicines unless your caregiver told you not to.  Take any  prescribed medicine as directed.   SEEK IMMEDIATE MEDICAL CARE IF:  The pain does not go away.  You have a fever >101 that persists You keep throwing up (vomiting) or cannot drink liquids.  The pain becomes localized (Pain in the right side could possibly be appendicitis. In an adult, pain in the left lower portion of the abdomen could be colitis or diverticulitis). You pass bloody or black tarry stools.  You have shaking chills.  There is blood in your vomit or you see blood in your bowel movements.  Your bowel movements stop (become blocked) or you cannot pass gas.  You have bloody, frequent, or painful urination.  You have yellow discoloration in the skin or whites of the eyes.  Your stomach becomes bloated or bigger.  You have dizziness or fainting.  You have chest or back pain.

## 2011-10-03 ENCOUNTER — Encounter (HOSPITAL_COMMUNITY): Payer: Self-pay | Admitting: *Deleted

## 2011-10-03 ENCOUNTER — Emergency Department (HOSPITAL_COMMUNITY)
Admission: EM | Admit: 2011-10-03 | Discharge: 2011-10-04 | Disposition: A | Discharge status: Home / Self Care | Payer: Medicaid Other | Attending: Emergency Medicine | Admitting: Emergency Medicine

## 2011-10-03 DIAGNOSIS — IMO0001 Reserved for inherently not codable concepts without codable children: Secondary | ICD-10-CM | POA: Insufficient documentation

## 2011-10-03 DIAGNOSIS — G43909 Migraine, unspecified, not intractable, without status migrainosus: Secondary | ICD-10-CM | POA: Insufficient documentation

## 2011-10-03 DIAGNOSIS — R109 Unspecified abdominal pain: Secondary | ICD-10-CM | POA: Insufficient documentation

## 2011-10-03 DIAGNOSIS — E785 Hyperlipidemia, unspecified: Secondary | ICD-10-CM | POA: Insufficient documentation

## 2011-10-03 DIAGNOSIS — G8929 Other chronic pain: Secondary | ICD-10-CM | POA: Insufficient documentation

## 2011-10-03 DIAGNOSIS — F329 Major depressive disorder, single episode, unspecified: Secondary | ICD-10-CM | POA: Insufficient documentation

## 2011-10-03 DIAGNOSIS — Z7902 Long term (current) use of antithrombotics/antiplatelets: Secondary | ICD-10-CM | POA: Insufficient documentation

## 2011-10-03 DIAGNOSIS — K219 Gastro-esophageal reflux disease without esophagitis: Secondary | ICD-10-CM | POA: Insufficient documentation

## 2011-10-03 DIAGNOSIS — E669 Obesity, unspecified: Secondary | ICD-10-CM | POA: Insufficient documentation

## 2011-10-03 DIAGNOSIS — Z794 Long term (current) use of insulin: Secondary | ICD-10-CM | POA: Insufficient documentation

## 2011-10-03 DIAGNOSIS — Z79899 Other long term (current) drug therapy: Secondary | ICD-10-CM | POA: Insufficient documentation

## 2011-10-03 DIAGNOSIS — B1910 Unspecified viral hepatitis B without hepatic coma: Secondary | ICD-10-CM | POA: Insufficient documentation

## 2011-10-03 DIAGNOSIS — G4733 Obstructive sleep apnea (adult) (pediatric): Secondary | ICD-10-CM | POA: Insufficient documentation

## 2011-10-03 DIAGNOSIS — E119 Type 2 diabetes mellitus without complications: Secondary | ICD-10-CM | POA: Insufficient documentation

## 2011-10-03 DIAGNOSIS — J45909 Unspecified asthma, uncomplicated: Secondary | ICD-10-CM | POA: Insufficient documentation

## 2011-10-03 DIAGNOSIS — R112 Nausea with vomiting, unspecified: Secondary | ICD-10-CM | POA: Insufficient documentation

## 2011-10-03 DIAGNOSIS — I2510 Atherosclerotic heart disease of native coronary artery without angina pectoris: Secondary | ICD-10-CM | POA: Insufficient documentation

## 2011-10-03 DIAGNOSIS — F3289 Other specified depressive episodes: Secondary | ICD-10-CM | POA: Insufficient documentation

## 2011-10-03 DIAGNOSIS — I252 Old myocardial infarction: Secondary | ICD-10-CM | POA: Insufficient documentation

## 2011-10-03 DIAGNOSIS — I10 Essential (primary) hypertension: Secondary | ICD-10-CM | POA: Insufficient documentation

## 2011-10-03 DIAGNOSIS — F411 Generalized anxiety disorder: Secondary | ICD-10-CM | POA: Insufficient documentation

## 2011-10-03 DIAGNOSIS — Z7982 Long term (current) use of aspirin: Secondary | ICD-10-CM | POA: Insufficient documentation

## 2011-10-03 LAB — POCT I-STAT, CHEM 8
Calcium, Ion: 1.14 mmol/L (ref 1.12–1.32)
Chloride: 104 mEq/L (ref 96–112)
HCT: 37 % (ref 36.0–46.0)
TCO2: 24 mmol/L (ref 0–100)

## 2011-10-03 MED ORDER — ONDANSETRON HCL 4 MG/2ML IJ SOLN
4.0000 mg | Freq: Once | INTRAMUSCULAR | Status: AC
  Administered 2011-10-03: 4 mg via INTRAVENOUS
  Filled 2011-10-03: qty 2

## 2011-10-03 MED ORDER — KETOROLAC TROMETHAMINE 30 MG/ML IJ SOLN
30.0000 mg | Freq: Once | INTRAMUSCULAR | Status: AC
  Administered 2011-10-03: 30 mg via INTRAVENOUS
  Filled 2011-10-03: qty 1

## 2011-10-03 MED ORDER — ONDANSETRON HCL 4 MG/2ML IJ SOLN
INTRAMUSCULAR | Status: AC
  Administered 2011-10-03: 22:00:00
  Filled 2011-10-03: qty 2

## 2011-10-03 MED ORDER — INSULIN ASPART PROT & ASPART (70-30 MIX) 100 UNIT/ML ~~LOC~~ SUSP
6.0000 [IU] | Freq: Once | SUBCUTANEOUS | Status: AC
  Administered 2011-10-04: 6 [IU] via SUBCUTANEOUS
  Filled 2011-10-03: qty 3

## 2011-10-03 MED ORDER — PANTOPRAZOLE SODIUM 40 MG IV SOLR
40.0000 mg | Freq: Once | INTRAVENOUS | Status: AC
  Administered 2011-10-03: 40 mg via INTRAVENOUS
  Filled 2011-10-03: qty 40

## 2011-10-03 NOTE — ED Notes (Signed)
[id number]<AV> Expected date:<BR> Expected time: 9:56 PM<BR> Means of arrival:<BR> Comments:<BR> M211 - 35yoF Abd pain, NV, Stomach CA hx

## 2011-10-03 NOTE — ED Provider Notes (Signed)
Medical screening examination/treatment/procedure(s) were performed by non-physician practitioner and as supervising physician I was immediately available for consultation/collaboration.   London Nonaka, MD 10/03/11 1628 

## 2011-10-03 NOTE — ED Notes (Signed)
Pt has inoperable stomach cancer and ran out of medication a week ago because she lost medicaid.  She presents with nausea and vomiting tonight.

## 2011-10-04 MED ORDER — ONDANSETRON 8 MG PO TBDP
8.0000 mg | ORAL_TABLET | Freq: Once | ORAL | Status: AC
  Administered 2011-10-04: 8 mg via ORAL

## 2011-10-04 MED ORDER — ONDANSETRON 8 MG PO TBDP
ORAL_TABLET | ORAL | Status: AC
  Administered 2011-10-04: 8 mg via ORAL
  Filled 2011-10-04: qty 1

## 2011-10-04 NOTE — ED Provider Notes (Signed)
History     CSN: [id number]  Arrival date & time 10/03/11  2156   First MD Initiated Contact with Patient 10/03/11 2257      Chief Complaint  Patient presents with  . Abdominal Pain    (Consider location/radiation/quality/duration/timing/severity/associated sxs/prior treatment) Patient is a 36 y.o. female presenting with abdominal pain.  Abdominal Pain The primary symptoms of the illness include abdominal pain, nausea and vomiting. The primary symptoms of the illness do not include fever, shortness of breath or dysuria.  Symptoms associated with the illness do not include chills or back pain.   History provided by patient. History of chronic abdominal pain with associated nausea and vomiting which is persistent tonight. Patient states she is unable to see her primary care physician at outpatient clinics because she lost her Medicaid. She states she ran out of Percocet but she is normally prescribed and is requesting the same today. Patient states she has had CAT scans, ultrasounds and is followed by GI. Pain is unchanged in character and quality. She describes as sharp left lower quadrant pain that is constant and does not go away. A moderate to severe. Patient states she does not have any medications for nausea at home. States unable to take medications today. No bloody or bilious emesis. Past Medical History  Diagnosis Date  . Thrombocytosis     Hem/Onc suggested 2/2 chronic hepatits and/or iron deficiency anemia  . Iron deficiency anemia   . N&V (nausea and vomiting)     and  Abdominal pain, chronic. Unclear ethiology with multiple admission and ED visits. Work up including Abdominal xray showed mostly nonspecific bowel gas pattern, CT abdomen with and without contrast (02/2011)  showed no acute process. Gastic Emptying scan (01/2010) was normal. Ultrasound of the abdomen was within normal limits. Hepatitis B viral load was undectable. HIV nonreactive. EGD (11/09/  . Abdominal pain     . Hypertension   . Hyperlipidemia   . Diabetes mellitus   . Recurrent boils   . Back pain   . OSA (obstructive sleep apnea)   . 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 Cath 12/10- no restenosis or significant CAD progression  . Irregular menses     Small ovarian follicles seen on CT(9/06)  . History of pyelonephritis     H/o GrpB Pyelonephritis (9/06) and UTI- 07/11- E.Coli, 12/10- GBS  . Abscess of tunica vaginalis     10/09- Abundant S. aureus- sensitive to all abx  . Obesity   . Asthma   . Gastritis   . Depression   . Hepatitis B, chronic     Hep BeAb+,Hep B cAb+ & Hep BsAg+ (9/06)  . Peripheral neuropathy   . Fibromyalgia   . GERD (gastroesophageal reflux disease)   . Migraine   . Anxiety   . Urinary tract infection   . Gastroparesis     secondary to poorly controlled DM, last emptying study performed 01/2010  was normal but may be falsely positive as pt was on reglan  . MI (myocardial infarction) 06/15/2006  . Blood dyscrasia     Past Surgical History  Procedure Date  . Cesarean section 1997    Family History  Problem Relation Age of Onset  . Diabetes Father     History  Substance Use Topics  . Smoking status: Former Smoker    Types: Cigarettes    Quit date:  04/24/1996  . Smokeless tobacco: Never Used   Comment: quit smoking cigarettes age 89  . Alcohol Use: Yes     03/31/11 "used marijuana last ~ 2 wk ago; only drink on occasions"    OB History    Grav Para Term Preterm Abortions TAB SAB Ect Mult Living                  Review of Systems  Constitutional: Negative for fever and chills.  HENT: Negative for neck pain and neck stiffness.   Eyes: Negative for pain.  Respiratory: Negative for shortness of breath.   Cardiovascular: Negative for chest pain.  Gastrointestinal: Positive for nausea, vomiting and abdominal pain.  Genitourinary:  Negative for dysuria.  Musculoskeletal: Negative for back pain.  Skin: Negative for rash.  Neurological: Negative for headaches.  All other systems reviewed and are negative.    Allergies  Lisinopril  Home Medications   Current Outpatient Rx  Name Route Sig Dispense Refill  . ASPIRIN 325 MG PO TABS Oral Take 325 mg by mouth daily.     Marland Kitchen CLONAZEPAM 0.5 MG PO TABS Oral Take 0.5 mg by mouth 3 (three) times daily as needed. For anxiety.    Di Kindle SULFATE 325 (65 FE) MG PO TABS Oral Take 325 mg by mouth daily with breakfast.    . FLUTICASONE-SALMETEROL 100-50 MCG/DOSE IN AEPB Inhalation Inhale 1 puff into the lungs every 12 (twelve) hours.      Marland Kitchen HYDROCODONE-ACETAMINOPHEN 5-325 MG PO TABS Oral Take 2 tablets by mouth every 4 (four) hours as needed for pain. 15 tablet 0  . HYOSCYAMINE SULFATE 0.125 MG PO TABS Oral Take 0.125 mg by mouth every 4 (four) hours as needed. cramping    . INSULIN GLARGINE 100 UNIT/ML [place] SOLN Subcutaneous Inject 40 Units into the skin at bedtime. 10 mL   . LABETALOL HCL 100 MG PO TABS Oral Take 0.5 tablets (50 mg total) by mouth 2 (two) times daily. 15 tablet 0  . OMEPRAZOLE 40 MG PO CPDR Oral Take 1 capsule (40 mg total) by mouth daily. 31 capsule 6  . OXYCODONE-ACETAMINOPHEN 5-325 MG PO TABS Oral Take 2 tablets by mouth every 4 (four) hours as needed for pain. 20 tablet 0  . PLAVIX 75 MG PO TABS  TAKE 1 TABLET BY MOUTH ONCE A DAY 31 tablet 0  . PROMETHAZINE HCL 25 MG RE SUPP Rectal Place 1 suppository (25 mg total) rectally every 6 (six) hours as needed for nausea. 12 each 0  . PROMETHAZINE HCL 25 MG PO TABS Oral Take 1 tablet (25 mg total) by mouth every 6 (six) hours as needed for nausea. 30 tablet 0    BP 137/78  Pulse 105  Temp 98.2 F (36.8 C) (Oral)  Resp 18  SpO2 96%  LMP 10/01/2011  Physical Exam  Constitutional: She is oriented to person, place, and time. She appears well-developed and well-nourished.  HENT:  Head: Normocephalic and  atraumatic.  Eyes: Conjunctivae and EOM are normal. Pupils are equal, round, and reactive to light.  Neck: Trachea normal. Neck supple. No thyromegaly present.  Cardiovascular: Normal rate, regular rhythm, S1 normal, S2 normal and normal pulses.     No systolic murmur is present   No diastolic murmur is present  Pulses:      Radial pulses are 2+ on the right side, and 2+ on the left side.  Pulmonary/Chest: Effort normal and breath sounds normal. She has no wheezes. She has no  rhonchi. She has no rales. She exhibits no tenderness.  Abdominal: Soft. Normal appearance and bowel sounds are normal. There is no CVA tenderness and negative Murphy's sign.       Mild left lower quadrant tenderness without rebound guarding or peritonitis. No tenderness otherwise with deep palpation throughout  Musculoskeletal:       BLE:s Calves nontender, no cords or erythema, negative Homans sign  Neurological: She is alert and oriented to person, place, and time. She has normal strength. No cranial nerve deficit or sensory deficit. GCS eye subscore is 4. GCS verbal subscore is 5. GCS motor subscore is 6.  Skin: Skin is warm and dry. No rash noted. She is not diaphoretic.  Psychiatric: Her speech is normal.       Cooperative and appropriate    ED Course  Procedures (including critical care time)  Labs Reviewed  POCT I-STAT, CHEM 8 - Abnormal; Notable for the following:    Glucose, Bld 292 (*)     All other components within normal limits   IV fluids and pain medications provided. IV Zofran for nausea. Labs obtained and reviewed as above. Subcutaneous insulin provided for mild hyperglycemia  Case discussed with patient's primary care physician, resident on call for outpatient clinics. Resident knows patient well and reviewed her old records and will schedule close followup in the clinic.  He agrees with plan to not repeat any imaging at this time for symptoms there are unchanged chronic.  Recheck after  medications, is improved.  MDM   Chronic abdominal pain with multiple workups in the past and multiple ED visits. Plan close primary care followup, established as above. Prescriptions for Phenergan and Protonix provided. Patient agrees to take over-the-counter anti-inflammatories as needed        Sunnie Nielsen, MD 10/04/11 0145

## 2011-10-05 ENCOUNTER — Ambulatory Visit: Payer: Medicaid Other | Admitting: Internal Medicine

## 2011-11-04 IMAGING — CR DG CHEST 2V
2 series · 2 of 2 positions shown · non-contrast
Comparison: 09/08/2008.

CLINICAL DATA: Chest pain/blurred vision.

CHEST - 2 VIEW

[w chest pa]
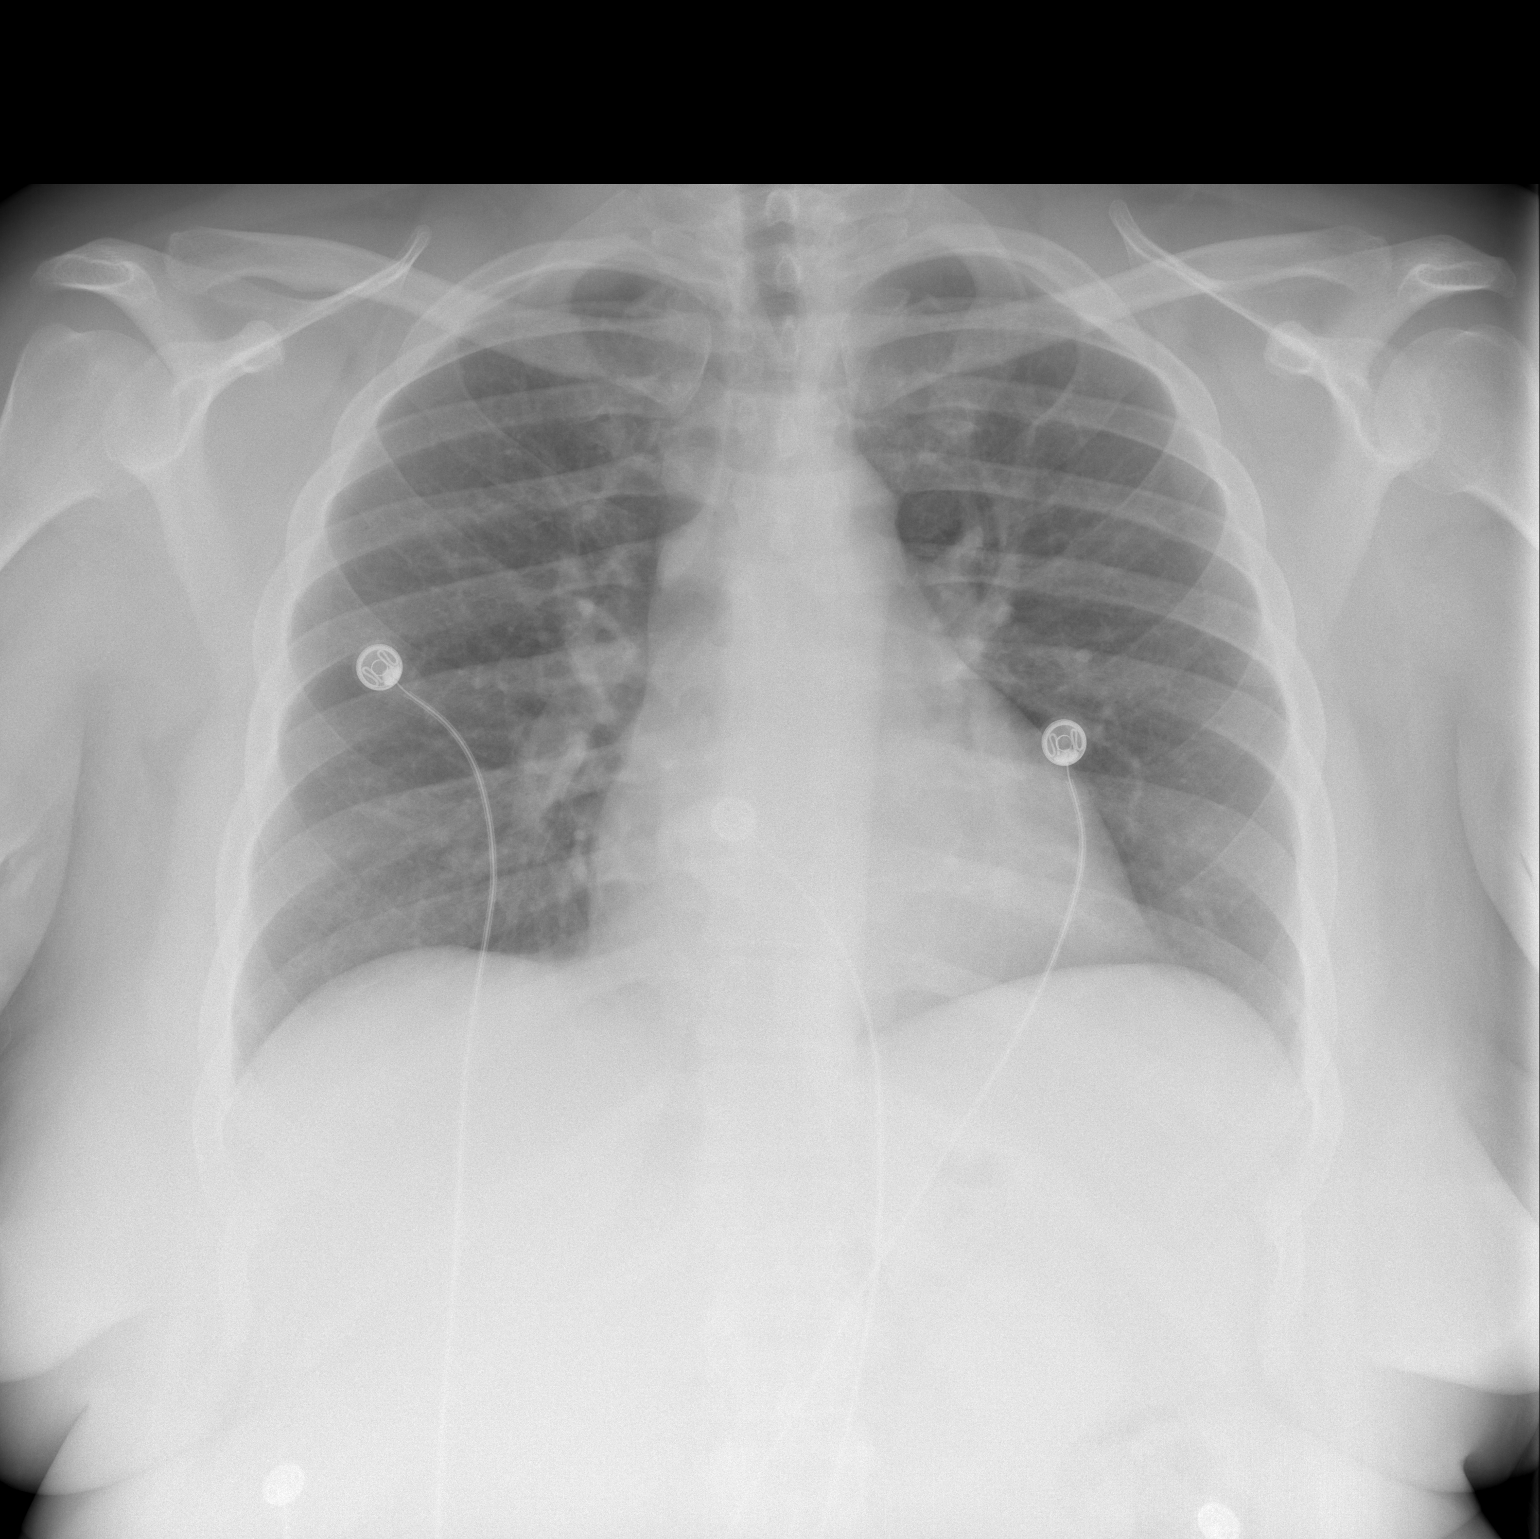

[w chest lat]
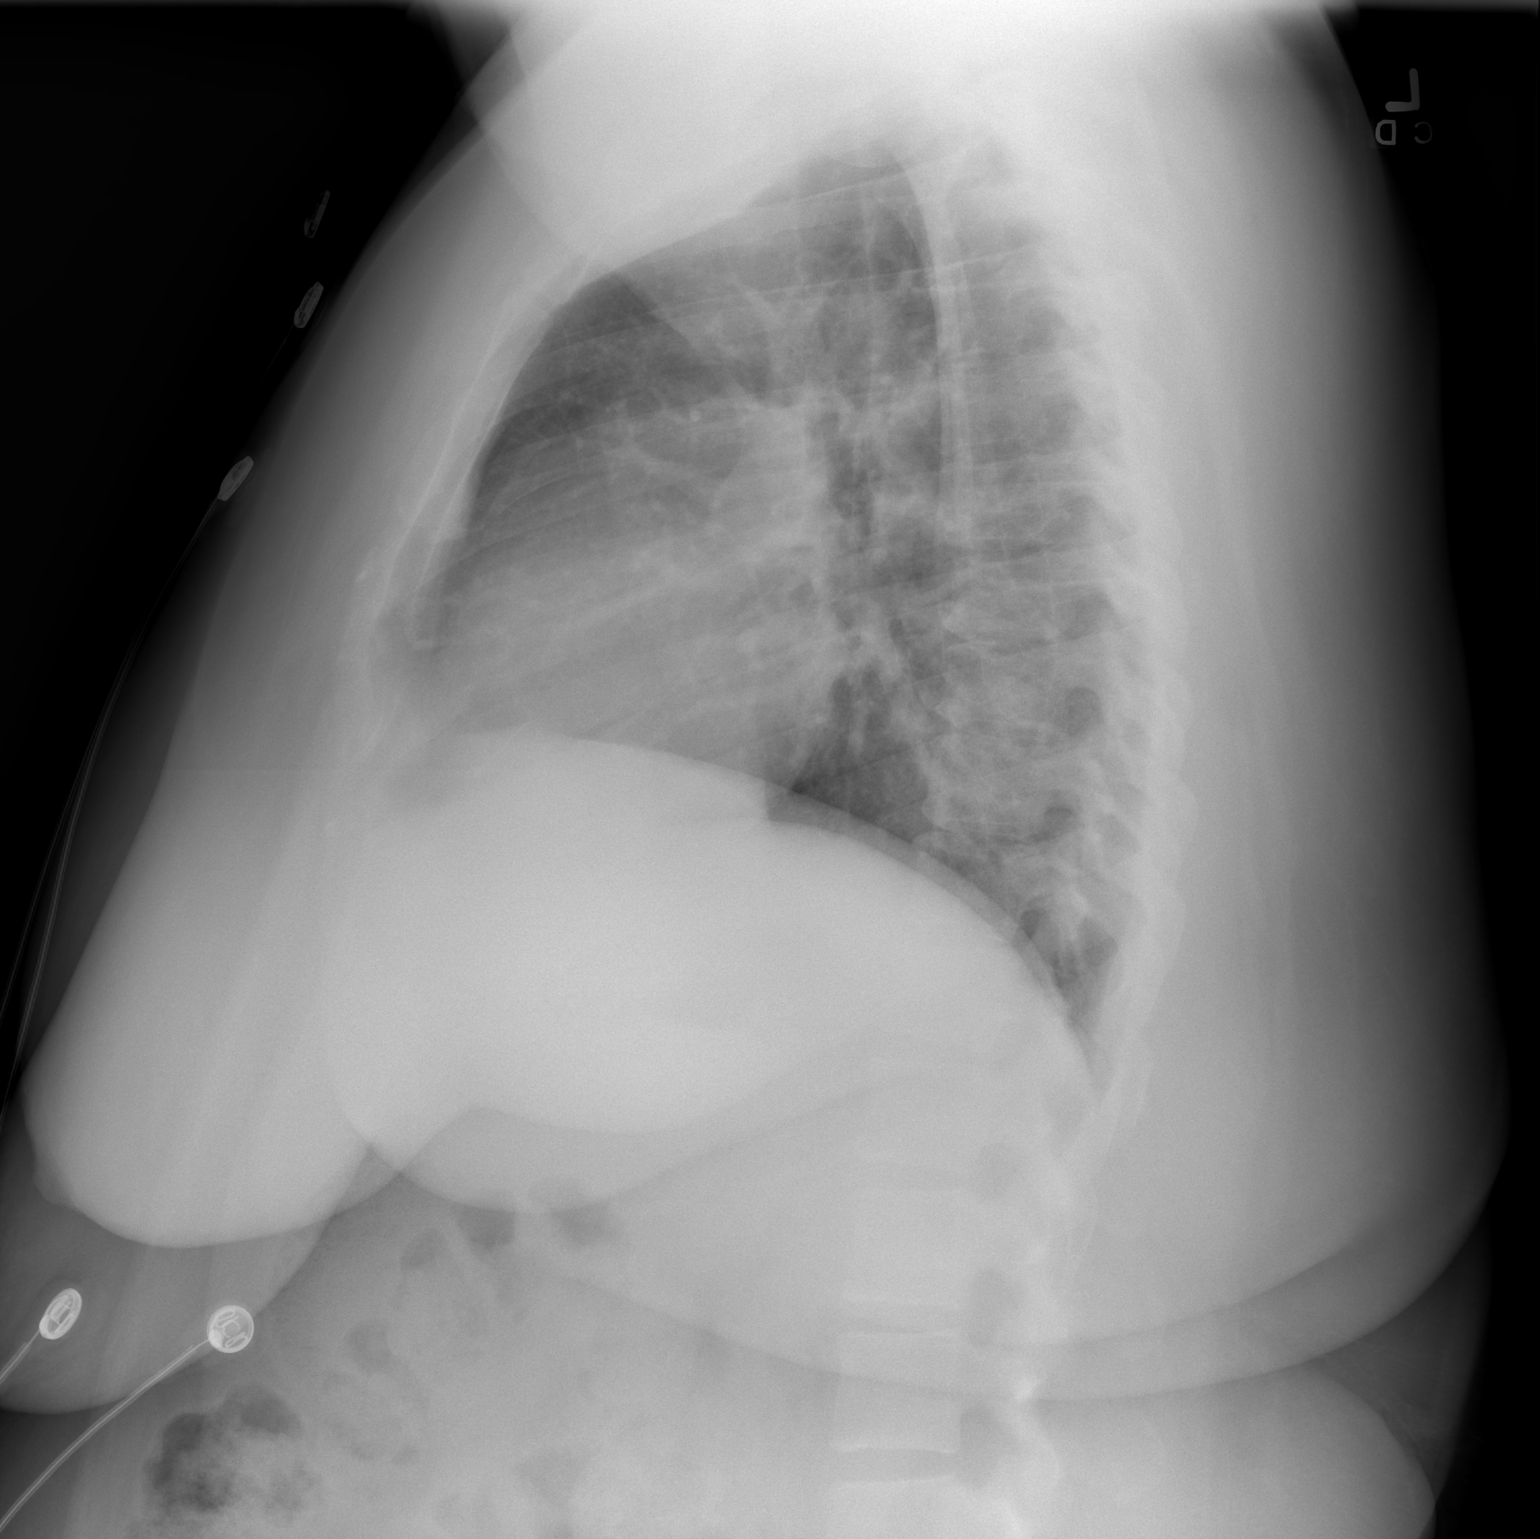

[2 of 2 positions shown; findings below may reference images not displayed]

FINDINGS: Heart and mediastinal contours normal.  Lungs clear.  No
pleural fluid.  Osseous structures and soft tissues unremarkable.
IMPRESSION: No acute or significant findings.

## 2011-11-15 ENCOUNTER — Emergency Department (HOSPITAL_COMMUNITY)
Admission: EM | Admit: 2011-11-15 | Discharge: 2011-11-15 | Disposition: A | Discharge status: Home / Self Care | Payer: Medicaid Other | Attending: Emergency Medicine | Admitting: Emergency Medicine

## 2011-11-15 ENCOUNTER — Encounter (HOSPITAL_COMMUNITY): Payer: Self-pay | Admitting: Emergency Medicine

## 2011-11-15 ENCOUNTER — Emergency Department (HOSPITAL_COMMUNITY): Payer: Medicaid Other

## 2011-11-15 DIAGNOSIS — N61 Mastitis without abscess: Secondary | ICD-10-CM | POA: Insufficient documentation

## 2011-11-15 DIAGNOSIS — Z79899 Other long term (current) drug therapy: Secondary | ICD-10-CM | POA: Insufficient documentation

## 2011-11-15 DIAGNOSIS — H9209 Otalgia, unspecified ear: Secondary | ICD-10-CM | POA: Insufficient documentation

## 2011-11-15 DIAGNOSIS — E119 Type 2 diabetes mellitus without complications: Secondary | ICD-10-CM | POA: Insufficient documentation

## 2011-11-15 DIAGNOSIS — I252 Old myocardial infarction: Secondary | ICD-10-CM | POA: Insufficient documentation

## 2011-11-15 DIAGNOSIS — Z87891 Personal history of nicotine dependence: Secondary | ICD-10-CM | POA: Insufficient documentation

## 2011-11-15 DIAGNOSIS — I2510 Atherosclerotic heart disease of native coronary artery without angina pectoris: Secondary | ICD-10-CM | POA: Insufficient documentation

## 2011-11-15 DIAGNOSIS — I10 Essential (primary) hypertension: Secondary | ICD-10-CM | POA: Insufficient documentation

## 2011-11-15 DIAGNOSIS — J029 Acute pharyngitis, unspecified: Secondary | ICD-10-CM

## 2011-11-15 DIAGNOSIS — R51 Headache: Secondary | ICD-10-CM | POA: Insufficient documentation

## 2011-11-15 DIAGNOSIS — N611 Abscess of the breast and nipple: Secondary | ICD-10-CM

## 2011-11-15 MED ORDER — HYDROCODONE-ACETAMINOPHEN 5-325 MG PO TABS: 1.0000 | ORAL_TABLET | ORAL | Status: DC | PRN

## 2011-11-15 MED ORDER — CLINDAMYCIN HCL 150 MG PO CAPS: 450.0000 mg | ORAL_CAPSULE | Freq: Three times a day (TID) | ORAL | Status: AC

## 2011-11-15 MED ORDER — OXYCODONE-ACETAMINOPHEN 5-325 MG PO TABS
1.0000 | ORAL_TABLET | Freq: Once | ORAL | Status: AC
  Administered 2011-11-15: 1 via ORAL
  Filled 2011-11-15: qty 1

## 2011-11-15 NOTE — ED Provider Notes (Signed)
History     CSN: [id number]  Arrival date & time 11/15/11  1626   First MD Initiated Contact with Patient 11/15/11 1735      Chief Complaint  Patient presents with  . Headache  . Otalgia  . Abscess    (Consider location/radiation/quality/duration/timing/severity/associated sxs/prior treatment) Patient is a 36 y.o. female presenting with ear pain and abscess. The history is provided by the patient.  Otalgia This is a new problem. The current episode started more than 2 days ago. There is pain in both (L>R) ears. The problem occurs constantly. The problem has been gradually worsening. There has been no fever. The pain is severe. Associated symptoms include headaches. Pertinent negatives include no ear discharge, no hearing loss, no rhinorrhea, no sore throat, no abdominal pain, no diarrhea, no vomiting, no neck pain, no cough and no rash. Associated symptoms comments: Dizziness and blurred vision yesterday, none today.  Abscess  This is a recurrent problem. The current episode started more than one week ago. The onset was gradual. The problem occurs occasionally. Affected Location: right breast. The problem is mild. The abscess is characterized by painfulness and redness. It is unknown what she was exposed to. Pertinent negatives include no fever, no diarrhea, no vomiting, no rhinorrhea, no sore throat and no cough. She has received no recent medical care.    Past Medical History  Diagnosis Date  . Thrombocytosis     Hem/Onc suggested 2/2 chronic hepatits and/or iron deficiency anemia  . Iron deficiency anemia   . N&V (nausea and vomiting)     and  Abdominal pain, chronic. Unclear ethiology with multiple admission and ED visits. Work up including Abdominal xray showed mostly nonspecific bowel gas pattern, CT abdomen with and without contrast (02/2011)  showed no acute process. Gastic Emptying scan (01/2010) was normal. Ultrasound of the abdomen was within normal limits. Hepatitis B viral  load was undectable. HIV nonreactive. EGD (11/09/  . Abdominal pain   . Hypertension   . Hyperlipidemia   . Diabetes mellitus   . Recurrent boils   . Back pain   . OSA (obstructive sleep apnea)   . 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 Cath 12/10- no restenosis or significant CAD progression  . Irregular menses     Small ovarian follicles seen on CT(9/06)  . History of pyelonephritis     H/o GrpB Pyelonephritis (9/06) and UTI- 07/11- E.Coli, 12/10- GBS  . Abscess of tunica vaginalis     10/09- Abundant S. aureus- sensitive to all abx  . Obesity   . Asthma   . Gastritis   . Depression   . Hepatitis B, chronic     Hep BeAb+,Hep B cAb+ & Hep BsAg+ (9/06)  . Peripheral neuropathy   . Fibromyalgia   . GERD (gastroesophageal reflux disease)   . Migraine   . Anxiety   . Urinary tract infection   . Gastroparesis     secondary to poorly controlled DM, last emptying study performed 01/2010  was normal but may be falsely positive as pt was on reglan  . MI (myocardial infarction) 06/15/2006  . Blood dyscrasia     Past Surgical History  Procedure Date  . Cesarean section 1997    Family History  Problem Relation Age of Onset  . Diabetes Father     History  Substance Use Topics  . Smoking status: Former  Smoker    Types: Cigarettes    Quit date: 04/24/1996  . Smokeless tobacco: Never Used   Comment: quit smoking cigarettes age 50  . Alcohol Use: Yes     03/31/11 "used marijuana last ~ 2 wk ago; only drink on occasions"    Review of Systems  Constitutional: Negative for fever and chills.  HENT: Positive for ear pain and tinnitus (yesterday, resolved today). Negative for hearing loss, sore throat, rhinorrhea, trouble swallowing, neck pain, neck stiffness, voice change, sinus pressure and ear discharge.   Eyes: Negative for pain. Visual disturbance: yesterday, resolved  today.  Respiratory: Negative for cough and shortness of breath.   Cardiovascular: Negative for chest pain.  Gastrointestinal: Negative for vomiting, abdominal pain and diarrhea.  Musculoskeletal: Negative for back pain.  Skin: Positive for color change and wound. Negative for rash.  Neurological: Positive for dizziness (yesterday, resolved today) and headaches. Negative for syncope, speech difficulty and weakness.  Psychiatric/Behavioral: Negative for confusion.    Allergies  Lisinopril  Home Medications   Current Outpatient Rx  Name Route Sig Dispense Refill  . ASPIRIN 325 MG PO TABS Oral Take 325 mg by mouth daily.     Marland Kitchen CLONAZEPAM 0.5 MG PO TABS Oral Take 0.5 mg by mouth 3 (three) times daily as needed. For anxiety.    Di Kindle SULFATE 325 (65 FE) MG PO TABS Oral Take 325 mg by mouth daily with breakfast.    . FLUTICASONE-SALMETEROL 100-50 MCG/DOSE IN AEPB Inhalation Inhale 1 puff into the lungs every 12 (twelve) hours.      . INSULIN GLARGINE 100 UNIT/ML [place] SOLN Subcutaneous Inject 40 Units into the skin at bedtime. 10 mL   . LABETALOL HCL 100 MG PO TABS Oral Take 0.5 tablets (50 mg total) by mouth 2 (two) times daily. 15 tablet 0  . OMEPRAZOLE 40 MG PO CPDR Oral Take 1 capsule (40 mg total) by mouth daily. 31 capsule 6  . PLAVIX 75 MG PO TABS  TAKE 1 TABLET BY MOUTH ONCE A DAY 31 tablet 0  . PROMETHAZINE HCL 25 MG RE SUPP Rectal Place 1 suppository (25 mg total) rectally every 6 (six) hours as needed for nausea. 12 each 0  . PROMETHAZINE HCL 25 MG PO TABS Oral Take 1 tablet (25 mg total) by mouth every 6 (six) hours as needed for nausea. 30 tablet 0    BP 131/92  Pulse 100  Temp 98.7 F (37.1 C) (Oral)  Resp 18  SpO2 100%  Physical Exam  Nursing note and vitals reviewed. Constitutional: She is oriented to person, place, and time. She appears well-developed and well-nourished. No distress.  HENT:  Head: Normocephalic and atraumatic. No trismus in the jaw.  Right Ear: No  tenderness. No mastoid tenderness. Tympanic membrane is retracted (dull). Tympanic membrane is not erythematous. No decreased hearing is noted.  Left Ear: There is tenderness. There is mastoid tenderness. Tympanic membrane is retracted (dull). Tympanic membrane is not erythematous. No decreased hearing is noted.  Nose: Nose normal.  Mouth/Throat: Uvula is midline and mucous membranes are normal. No uvula swelling. Oropharyngeal exudate (scant) present. No tonsillar abscesses.       Bilateral ear canals with dry skin, c/w eczema hx.  Eyes: Conjunctivae and EOM are normal. Pupils are equal, round, and reactive to light.  Neck: Normal range of motion. Neck supple.  Cardiovascular: Normal rate and regular rhythm.   Pulmonary/Chest: Effort normal. No respiratory distress.  Abdominal: Soft. She exhibits no distension. There  is no tenderness.  Musculoskeletal: She exhibits no edema and no tenderness.  Lymphadenopathy:    She has no cervical adenopathy.  Neurological: She is alert and oriented to person, place, and time. No cranial nerve deficit (3-12 intact).  Skin: Skin is warm and dry.       Four 1cm areas of erythema with mild TTP scattered over right breast. No central fluctuance, minimal induration. No open wounds.    ED Course  Procedures (including critical care time)  Labs Reviewed - No data to display Ct Head Wo Contrast  11/15/2011  *RADIOLOGY REPORT*  Clinical Data: Left-sided ear pain.  CT HEAD WITHOUT CONTRAST  Technique:  Contiguous axial images were obtained from the base of the skull through the vertex without contrast.  Comparison: None.  Findings: The brain demonstrates no evidence of hemorrhage, infarction, edema, mass effect, extra-axial fluid collection, hydrocephalus or mass lesion.  The skull is unremarkable.  Mastoid air cells and visualized paranasal sinuses are normally aerated. Although not a dedicated temporal bone study, the current CT is felt adequate to exclude acute  mastoiditis.  No abnormalities seen involving the auditory canal.  IMPRESSION: Normal head CT.  No evidence of left-sided mastoiditis.  Original Report Authenticated By: Reola Calkins, M.D.     1. Pharyngitis   2. Abscess of breast       MDM  Bilateral ear pain, L>R with mastoid TTP on the left. CT scan is ordered per discussion with radiologist to r/o mastoiditis and is negative. Slight exudate to throat without fever or LAD, doubt strep pharyngitis. Suspect pharyngitis as cause of ear pain. ABscesses not amenable to I&D at this point. Will give abx and pt agrees to have 2 day recheck at PCP.        Shaaron Adler, [place] 11/15/11 2043

## 2011-11-15 NOTE — ED Notes (Signed)
Patient reports that she has bilateral ear pain that is causing her to have a HA. Reports dizziness and blurred vision with the pain. The patient also reports that she has three boils on her rightbreast and she would like that looked at as well.

## 2011-11-15 NOTE — ED Provider Notes (Signed)
Medical screening examination/treatment/procedure(s) were performed by non-physician practitioner and as supervising physician I was immediately available for consultation/collaboration.  Ethelda Chick, MD 11/15/11 2109

## 2011-11-23 ENCOUNTER — Emergency Department (HOSPITAL_COMMUNITY): Payer: Medicaid Other

## 2011-11-23 ENCOUNTER — Emergency Department (HOSPITAL_COMMUNITY)
Admission: EM | Admit: 2011-11-23 | Discharge: 2011-11-23 | Discharge status: Against Medical Advice | Payer: Medicaid Other | Attending: Emergency Medicine | Admitting: Emergency Medicine

## 2011-11-23 ENCOUNTER — Encounter (HOSPITAL_COMMUNITY): Payer: Self-pay | Admitting: Emergency Medicine

## 2011-11-23 DIAGNOSIS — E119 Type 2 diabetes mellitus without complications: Secondary | ICD-10-CM | POA: Insufficient documentation

## 2011-11-23 DIAGNOSIS — R109 Unspecified abdominal pain: Secondary | ICD-10-CM

## 2011-11-23 DIAGNOSIS — R000 Tachycardia, unspecified: Secondary | ICD-10-CM | POA: Insufficient documentation

## 2011-11-23 DIAGNOSIS — R10814 Left lower quadrant abdominal tenderness: Secondary | ICD-10-CM | POA: Insufficient documentation

## 2011-11-23 DIAGNOSIS — Z794 Long term (current) use of insulin: Secondary | ICD-10-CM | POA: Insufficient documentation

## 2011-11-23 DIAGNOSIS — I10 Essential (primary) hypertension: Secondary | ICD-10-CM | POA: Insufficient documentation

## 2011-11-23 DIAGNOSIS — R112 Nausea with vomiting, unspecified: Secondary | ICD-10-CM | POA: Insufficient documentation

## 2011-11-23 DIAGNOSIS — N390 Urinary tract infection, site not specified: Secondary | ICD-10-CM | POA: Insufficient documentation

## 2011-11-23 LAB — COMPREHENSIVE METABOLIC PANEL
ALT: 8 U/L (ref 0–35)
AST: 13 U/L (ref 0–37)
Albumin: 3.9 g/dL (ref 3.5–5.2)
CO2: 20 mEq/L (ref 19–32)
Chloride: 92 mEq/L — ABNORMAL LOW (ref 96–112)
Creatinine, Ser: 0.73 mg/dL (ref 0.50–1.10)
GFR calc non Af Amer: >90 mL/min (ref >90)
Potassium: 3.8 mEq/L (ref 3.5–5.1)
Sodium: 134 mEq/L — ABNORMAL LOW (ref 135–145)
Total Bilirubin: 0.5 mg/dL (ref 0.3–1.2)

## 2011-11-23 LAB — CBC WITH DIFFERENTIAL/PLATELET
Eosinophils Relative: 0 % (ref 0–5)
HCT: 39.4 % (ref 36.0–46.0)
Hemoglobin: 13 g/dL (ref 12.0–15.0)
Lymphocytes Relative: 12 % (ref 12–46)
MCV: 64.3 fL — ABNORMAL LOW (ref 78.0–100.0)
Monocytes Absolute: 1.1 10*3/uL — ABNORMAL HIGH (ref 0.1–1.0)
Monocytes Relative: 6 % (ref 3–12)
Neutro Abs: 15.8 10*3/uL — ABNORMAL HIGH (ref 1.7–7.7)
WBC: 19.3 10*3/uL — ABNORMAL HIGH (ref 4.0–10.5)

## 2011-11-23 LAB — URINE MICROSCOPIC-ADD ON

## 2011-11-23 LAB — URINALYSIS, ROUTINE W REFLEX MICROSCOPIC
Glucose, UA: >1000 mg/dL — AB
Hgb urine dipstick: NEGATIVE
Ketones, ur: >80 mg/dL — AB
Protein, ur: 30 mg/dL — AB
pH: 5.5 (ref 5.0–8.0)

## 2011-11-23 LAB — RAPID URINE DRUG SCREEN, HOSP PERFORMED
Amphetamines: NOT DETECTED
Barbiturates: NOT DETECTED
Benzodiazepines: NOT DETECTED
Cocaine: NOT DETECTED
Opiates: NOT DETECTED
Tetrahydrocannabinol: NOT DETECTED

## 2011-11-23 MED ORDER — ONDANSETRON 8 MG PO TBDP: 8.0000 mg | ORAL_TABLET | Freq: Three times a day (TID) | ORAL | Status: AC | PRN

## 2011-11-23 MED ORDER — ONDANSETRON HCL 4 MG/2ML IJ SOLN
4.0000 mg | Freq: Once | INTRAMUSCULAR | Status: AC
  Administered 2011-11-23: 4 mg via INTRAVENOUS
  Filled 2011-11-23: qty 2

## 2011-11-23 MED ORDER — METRONIDAZOLE 500 MG PO TABS
2000.0000 mg | ORAL_TABLET | Freq: Once | ORAL | Status: AC
  Administered 2011-11-23: 2000 mg via ORAL
  Filled 2011-11-23: qty 4

## 2011-11-23 MED ORDER — HYDROMORPHONE HCL PF 1 MG/ML IJ SOLN
1.0000 mg | INTRAMUSCULAR | Status: DC | PRN
  Administered 2011-11-23: 1 mg via INTRAVENOUS
  Filled 2011-11-23: qty 1

## 2011-11-23 MED ORDER — SODIUM CHLORIDE 0.9 % IV SOLN
1000.0000 mL | Freq: Once | INTRAVENOUS | Status: AC
  Administered 2011-11-23: 1000 mL via INTRAVENOUS

## 2011-11-23 MED ORDER — IOHEXOL 300 MG/ML  SOLN: 100.0000 mL | Freq: Once | INTRAMUSCULAR | Status: DC | PRN

## 2011-11-23 MED ORDER — SODIUM CHLORIDE 0.9 % IV SOLN
1000.0000 mL | INTRAVENOUS | Status: DC
  Administered 2011-11-23 (×2): 1000 mL via INTRAVENOUS

## 2011-11-23 MED ORDER — DEXTROSE 5 % IV SOLN
1.0000 g | INTRAVENOUS | Status: DC
  Administered 2011-11-23: 1 g via INTRAVENOUS
  Filled 2011-11-23: qty 10

## 2011-11-23 MED ORDER — SODIUM CHLORIDE 0.9 % IV SOLN: 1000.0000 mL | Freq: Once | INTRAVENOUS | Status: DC

## 2011-11-23 MED ORDER — IOHEXOL 300 MG/ML  SOLN
100.0000 mL | Freq: Once | INTRAMUSCULAR | Status: AC | PRN
  Administered 2011-11-23: 100 mL via INTRAVENOUS

## 2011-11-23 MED ORDER — CEFUROXIME AXETIL 250 MG PO TABS: 250.0000 mg | ORAL_TABLET | Freq: Two times a day (BID) | ORAL | Status: AC

## 2011-11-23 NOTE — ED Provider Notes (Addendum)
History     CSN: [id number] Arrival date & time 11/23/11  1030 First MD Initiated Contact with Patient 11/23/11 1053      Chief Complaint  Patient presents with  . Abdominal Pain    HPI The patient presents to the emergency room with complaints of left lower quadrant abdominal pain. The symptoms started about 3 days ago. She has had multiple episodes of vomiting patient describes it as nonbloody and nonbilious. She also has been very anxious and upset due to the recent death of her mother. She denies any chest pain or shortness of breath. She does have history of chronic recurrent abdominal pain. This episode is somewhat similar. She denies vaginal discharge or bleeding. Past Medical History  Diagnosis Date  . Thrombocytosis     Hem/Onc suggested 2/2 chronic hepatits and/or iron deficiency anemia  . Iron deficiency anemia   . N&V (nausea and vomiting)     and  Abdominal pain, chronic. Unclear ethiology with multiple admission and ED visits. Work up including Abdominal xray showed mostly nonspecific bowel gas pattern, CT abdomen with and without contrast (02/2011)  showed no acute process. Gastic Emptying scan (01/2010) was normal. Ultrasound of the abdomen was within normal limits. Hepatitis B viral load was undectable. HIV nonreactive. EGD (11/09/  . Abdominal pain   . Hypertension   . Hyperlipidemia   . Diabetes mellitus   . Recurrent boils   . Back pain   . OSA (obstructive sleep apnea)   . 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 Cath 12/10- no restenosis or significant CAD progression  . Irregular menses     Small ovarian follicles seen on CT(9/06)  . History of pyelonephritis     H/o GrpB Pyelonephritis (9/06) and UTI- 07/11- E.Coli, 12/10- GBS  . Abscess of tunica vaginalis     10/09- Abundant S. aureus- sensitive to all abx  . Obesity   . Asthma   . Gastritis   .  Depression   . Hepatitis B, chronic     Hep BeAb+,Hep B cAb+ & Hep BsAg+ (9/06)  . Peripheral neuropathy   . Fibromyalgia   . GERD (gastroesophageal reflux disease)   . Migraine   . Anxiety   . Urinary tract infection   . Gastroparesis     secondary to poorly controlled DM, last emptying study performed 01/2010  was normal but may be falsely positive as pt was on reglan  . MI (myocardial infarction) 06/15/2006  . Blood dyscrasia     Past Surgical History  Procedure Date  . Cesarean section 1997    Family History  Problem Relation Age of Onset  . Diabetes Father     History  Substance Use Topics  . Smoking status: Former Smoker    Types: Cigarettes    Quit date: 04/24/1996  . Smokeless tobacco: Never Used   Comment: quit smoking cigarettes age 28  . Alcohol Use: Yes     03/31/11 "used marijuana last ~ 2 wk ago; only drink on occasions"    OB History    Grav Para Term Preterm Abortions TAB SAB Ect Mult Living                  Review of Systems  Constitutional: Negative for fever.  Respiratory: Negative for cough and shortness of breath.   Cardiovascular: Negative for chest pain.  Gastrointestinal: Positive for  abdominal pain.  Genitourinary: Positive for dysuria.  Skin: Negative for rash.  All other systems reviewed and are negative.    Allergies  Lisinopril  Home Medications   Current Outpatient Rx  Name Route Sig Dispense Refill  . ASPIRIN 325 MG PO TABS Oral Take 325 mg by mouth daily.     Marland Kitchen CLINDAMYCIN HCL 150 MG PO CAPS Oral Take 3 capsules (450 mg total) by mouth 3 (three) times daily. 90 capsule 0  . CLONAZEPAM 0.5 MG PO TABS Oral Take 0.5 mg by mouth 3 (three) times daily as needed. For anxiety.    Di Kindle SULFATE 325 (65 FE) MG PO TABS Oral Take 325 mg by mouth daily with breakfast.    . FLUTICASONE-SALMETEROL 100-50 MCG/DOSE IN AEPB Inhalation Inhale 1 puff into the lungs every 12 (twelve) hours.      . INSULIN GLARGINE 100 UNIT/ML [place] SOLN  Subcutaneous Inject 40 Units into the skin at bedtime. 10 mL   . LABETALOL HCL 100 MG PO TABS Oral Take 0.5 tablets (50 mg total) by mouth 2 (two) times daily. 15 tablet 0  . OMEPRAZOLE 40 MG PO CPDR Oral Take 1 capsule (40 mg total) by mouth daily. 31 capsule 6  . PLAVIX 75 MG PO TABS  TAKE 1 TABLET BY MOUTH ONCE A DAY 31 tablet 0  . HYDROCODONE-ACETAMINOPHEN 5-325 MG PO TABS Oral Take 1 tablet by mouth every 4 (four) hours as needed for pain. 12 tablet 0  . PROMETHAZINE HCL 25 MG RE SUPP Rectal Place 1 suppository (25 mg total) rectally every 6 (six) hours as needed for nausea. 12 each 0  . PROMETHAZINE HCL 25 MG PO TABS Oral Take 1 tablet (25 mg total) by mouth every 6 (six) hours as needed for nausea. 30 tablet 0    BP 143/102  Pulse 57  Temp 99 F (37.2 C) (Oral)  SpO2 97%  Physical Exam  Nursing note and vitals reviewed. Constitutional: She appears distressed.  HENT:  Head: Normocephalic and atraumatic.  Right Ear: External ear normal.  Left Ear: External ear normal.  Eyes: Conjunctivae are normal. Right eye exhibits no discharge. Left eye exhibits no discharge. No scleral icterus.  Neck: Neck supple. No tracheal deviation present.  Cardiovascular: Regular rhythm and intact distal pulses.  Tachycardia present.   Pulmonary/Chest: Effort normal and breath sounds normal. No stridor. No respiratory distress. She has no wheezes. She has no rales.  Abdominal: Soft. Bowel sounds are normal. She exhibits no distension. There is tenderness in the left lower quadrant. There is no rebound and no guarding.  Genitourinary: Vagina normal. There is no rash on the right labia. There is no rash on the left labia. Uterus is not enlarged and not tender. Cervix exhibits discharge. Cervix exhibits no motion tenderness and no friability. Right adnexum displays no mass and no tenderness. Left adnexum displays no mass and no tenderness.  Musculoskeletal: She exhibits no edema and no tenderness.    Neurological: She is alert. She has normal strength. No sensory deficit. Cranial nerve deficit:  no gross defecits noted. She exhibits normal muscle tone. She displays no seizure activity. Coordination normal.  Skin: Skin is warm and dry. No rash noted.  Psychiatric: She has a normal mood and affect.    ED Course  Procedures (including critical care time) EKG Rate 130 SINUS TACHYCARDIA ~ V-rate> 99 BORDERLINE IVCD WITH LAD INFERIOR Q WAVES, PROBABLY NORMAL VARIATION ~ LATERAL Q WAVES, PROBABLY NORMAL VARIATION REPOL ABNRM SUGGESTS  ISCHEMIA, DIFFUSE LEADS ~ ST-T neg, ant/lat/inf Rate faster since last tracing Labs Reviewed  COMPREHENSIVE METABOLIC PANEL - Abnormal; Notable for the following:    Sodium 134 (*)     Chloride 92 (*)     Glucose, Bld 352 (*)     Total Protein 9.5 (*)     All other components within normal limits  URINALYSIS, ROUTINE W REFLEX MICROSCOPIC - Abnormal; Notable for the following:    APPearance CLOUDY (*)     Specific Gravity, Urine 1.041 (*)     Glucose, UA >1000 (*)     Bilirubin Urine SMALL (*)     Ketones, ur >80 (*)     Protein, ur 30 (*)     Leukocytes, UA SMALL (*)     All other components within normal limits  CBC WITH DIFFERENTIAL - Abnormal; Notable for the following:    WBC 19.3 (*)     RBC 6.13 (*)     MCV 64.3 (*)     MCH 21.2 (*)     RDW 19.0 (*)     Platelets 617 (*)     Neutrophils Relative 82 (*)     Neutro Abs 15.8 (*)     Monocytes Absolute 1.1 (*)     All other components within normal limits  URINE MICROSCOPIC-ADD ON - Abnormal; Notable for the following:    Squamous Epithelial / LPF MANY (*)     Bacteria, UA FEW (*)     All other components within normal limits  LIPASE, BLOOD  URINE CULTURE  LACTIC ACID, PLASMA  URINE RAPID DRUG SCREEN (HOSP PERFORMED)   Ct Abdomen Pelvis W Contrast  11/23/2011  *RADIOLOGY REPORT*  Clinical Data: LLQ abdominal pain, emesis  CT ABDOMEN AND PELVIS WITH CONTRAST  Technique:  Multidetector  CT imaging of the abdomen and pelvis was performed following the standard protocol during bolus administration of intravenous contrast.  Contrast:  100 ml Omnipaque-300 administered intravenously.  Comparison: Abdominal radiographs obtained today, 11/23/2011; most recent CT scan of the abdomen and pelvis 03/04/2011.  Findings:  Lower Chest: Minimal atelectasis in the lingula, and left lower lobe.  Mild respiratory motion limits evaluation for small pulmonary nodules.  Otherwise, the lower thorax is unremarkable.  Abdomen: The lower esophagus, stomach and duodenum are unremarkable appearance.  The spleen, pancreas and adrenal glands, liver, and kidneys are unremarkable in appearance.  Specifically, no focal solid lesion, hydronephrosis, or other abnormality. Gallbladder is unremarkable. No intra or extrahepatic biliary ductal dilatation.  Normal-caliber large and small bowel throughout the abdomen. Normal appendix identified in the right lower quadrant.  No significant colonic diverticular disease.  No ascites, or suspicious mesenteric upper abdominal or retroperitoneal adenopathy.  Pelvis: The bladder is distended with urine. Query a 2.5 cm enhancing lesion at the posterior uterine fundus consistent with a uterine fibroid.  Unremarkable appearance of the adnexa and reproductive age female.  Bones: Nonspecific 1 cm soft tissue density in the outer aspect of the mid right breast. Several locules of gas are noted in the subcutaneous fat of the right greater than left lower back.  This is of uncertain etiology.  There is no associated fluid collection, or significant stranding.  No acute fracture or aggressive appearing lytic or blastic osseous lesion. Focal degenerative disc disease at L4-L5 and L5-S1 with posterior disc bulge at L5-S1.  Vascular: No significant atherosclerotic vascular disease. Incidental note is made of a circumaortic left renal vein.  IMPRESSION:  1.  No acute intra-abdominal  or pelvic process to  explain the patient's clinical symptoms of left lower quadrant pain, and emesis.  2.   Nonspecific 1 cm soft tissue density in the outer aspect of the mid right breast.  This is a nonspecific finding by CT scan. Recommend clinical correlation with breast examination.  If a palpable nodule is present, consider further evaluation with mammography.  3. Benign appearing gas in the subcutaneous fat of the right greater than left lower back.  There is no associated fluid collection, and no significant adjacent stranding or inflammatory change.  Query recent attempted intramuscular injection.  4.  Probable fibroid uterus  Original Report Authenticated By: Alvino Blood Abd Acute W/chest  11/23/2011  *RADIOLOGY REPORT*  Clinical Data: Abdominal pain, nausea, vomiting, shortness of breath.  ACUTE ABDOMEN SERIES (ABDOMEN 2 VIEW & CHEST 1 VIEW)  Comparison: 03/03/2011  Findings: The bowel gas pattern is normal.  There is no evidence of free intraperitoneal air.  No suspicious radio-opaque calculi or other significant radiographic abnormality is seen. Heart size and mediastinal contours are within normal limits.  Both lungs are clear.  IMPRESSION: No acute findings.  Original Report Authenticated By: Cyndie Chime, M.D.     MDM  Pt having persistent pain.  Still remains tachcyardic despite initial fluid bolus and pain medications.  Probable uti, will plan on CT scan for further evaluation.  R/o diverticulitis, other acute abdominal process.   Pt still tachycardic with heart rate in the 130s.  She does not appear septic and has been given IV fluids.  I recommend admission however the patient states she needs to leave.  Her mother's funeral is tomorrow.  I explained to she could always leave tomorrow.  Pt states she cannot risk it.  Pt understands she can return at any time.  I explained to her I was worried about a serious infection, that could get worse or potentially be life threatening.  Celene Kras, MD 11/23/11  [phone]

## 2011-11-23 NOTE — ED Notes (Signed)
Iv attempted x2 unsuccessful iv team called  

## 2011-11-23 NOTE — ED Notes (Signed)
Dr Lynelle Doctor aware of hr will continue to montior 120-140

## 2011-11-23 NOTE — ED Notes (Signed)
Pt here per EMS for c/o ad pain x3 days LLQ with emesis Ems stated that she has anxiety due to mother passing moaning and crying at this time

## 2011-11-23 NOTE — ED Notes (Signed)
JYN:[id number]<AO> Expected date:11/23/11<BR> Expected time:10:22 AM<BR> Means of arrival:Ambulance<BR> Comments:<BR> 36 F Abdominal pain

## 2011-11-25 LAB — URINE CULTURE

## 2011-12-05 ENCOUNTER — Encounter: Payer: Self-pay | Admitting: Internal Medicine

## 2011-12-16 ENCOUNTER — Encounter (HOSPITAL_COMMUNITY): Payer: Self-pay | Admitting: *Deleted

## 2011-12-16 ENCOUNTER — Emergency Department (HOSPITAL_COMMUNITY)
Admission: EM | Admit: 2011-12-16 | Discharge: 2011-12-17 | Disposition: A | Discharge status: Home / Self Care | Payer: Medicaid Other | Attending: Emergency Medicine | Admitting: Emergency Medicine

## 2011-12-16 ENCOUNTER — Emergency Department (HOSPITAL_COMMUNITY): Payer: Medicaid Other

## 2011-12-16 DIAGNOSIS — I10 Essential (primary) hypertension: Secondary | ICD-10-CM | POA: Insufficient documentation

## 2011-12-16 DIAGNOSIS — R10817 Generalized abdominal tenderness: Secondary | ICD-10-CM | POA: Insufficient documentation

## 2011-12-16 DIAGNOSIS — Z794 Long term (current) use of insulin: Secondary | ICD-10-CM | POA: Insufficient documentation

## 2011-12-16 DIAGNOSIS — R109 Unspecified abdominal pain: Secondary | ICD-10-CM

## 2011-12-16 DIAGNOSIS — R000 Tachycardia, unspecified: Secondary | ICD-10-CM | POA: Insufficient documentation

## 2011-12-16 DIAGNOSIS — E860 Dehydration: Secondary | ICD-10-CM

## 2011-12-16 DIAGNOSIS — Z8619 Personal history of other infectious and parasitic diseases: Secondary | ICD-10-CM | POA: Insufficient documentation

## 2011-12-16 DIAGNOSIS — E119 Type 2 diabetes mellitus without complications: Secondary | ICD-10-CM | POA: Insufficient documentation

## 2011-12-16 LAB — CBC WITH DIFFERENTIAL/PLATELET
Basophils Relative: 0 % (ref 0–1)
Eosinophils Absolute: 0 10*3/uL (ref 0.0–0.7)
HCT: 39.9 % (ref 36.0–46.0)
Hemoglobin: 13.4 g/dL (ref 12.0–15.0)
Lymphs Abs: 2 10*3/uL (ref 0.7–4.0)
MCH: 22 pg — ABNORMAL LOW (ref 26.0–34.0)
MCHC: 33.6 g/dL (ref 30.0–36.0)
Monocytes Absolute: 0.6 10*3/uL (ref 0.1–1.0)
Neutro Abs: 17 10*3/uL — ABNORMAL HIGH (ref 1.7–7.7)

## 2011-12-16 LAB — COMPREHENSIVE METABOLIC PANEL
Alkaline Phosphatase: 86 U/L (ref 39–117)
BUN: 14 mg/dL (ref 6–23)
Calcium: 10.4 mg/dL (ref 8.4–10.5)
GFR calc Af Amer: >90 mL/min (ref >90)
Glucose, Bld: 371 mg/dL — ABNORMAL HIGH (ref 70–99)
Total Protein: 9.3 g/dL — ABNORMAL HIGH (ref 6.0–8.3)

## 2011-12-16 LAB — LIPASE, BLOOD: Lipase: 23 U/L (ref 11–59)

## 2011-12-16 MED ORDER — SODIUM CHLORIDE 0.9 % IV SOLN
1000.0000 mL | Freq: Once | INTRAVENOUS | Status: AC
  Administered 2011-12-16: 1000 mL via INTRAVENOUS

## 2011-12-16 MED ORDER — SODIUM CHLORIDE 0.9 % IV SOLN
1000.0000 mL | INTRAVENOUS | Status: DC
  Administered 2011-12-16: 1000 mL via INTRAVENOUS

## 2011-12-16 MED ORDER — METOCLOPRAMIDE HCL 5 MG/ML IJ SOLN
10.0000 mg | Freq: Once | INTRAMUSCULAR | Status: AC
  Administered 2011-12-16: 10 mg via INTRAVENOUS
  Filled 2011-12-16: qty 2

## 2011-12-16 MED ORDER — HYDROMORPHONE HCL PF 1 MG/ML IJ SOLN
1.0000 mg | Freq: Once | INTRAMUSCULAR | Status: AC
  Administered 2011-12-16: 1 mg via INTRAVENOUS
  Filled 2011-12-16: qty 1

## 2011-12-16 MED ORDER — ONDANSETRON HCL 4 MG/2ML IJ SOLN
4.0000 mg | Freq: Once | INTRAMUSCULAR | Status: AC
  Administered 2011-12-16: 4 mg via INTRAVENOUS
  Filled 2011-12-16: qty 2

## 2011-12-16 MED ORDER — LORAZEPAM 2 MG/ML IJ SOLN
1.0000 mg | Freq: Once | INTRAMUSCULAR | Status: AC
  Administered 2011-12-16: 1 mg via INTRAVENOUS
  Filled 2011-12-16: qty 1

## 2011-12-16 NOTE — ED Notes (Signed)
Patient from home brought for left lower abdominal pain x 3 days with N/V.

## 2011-12-16 NOTE — ED Provider Notes (Signed)
History     CSN: [id number]  Arrival date & time 12/16/11  4098   First MD Initiated Contact with Patient 12/16/11 1826      Chief Complaint  Patient presents with  . Abdominal Pain    The history is provided by the patient.   the patient reports severe abdominal pain located in her left lower abdomen for 3 days with associated nausea vomiting.  She denies diarrhea.  She's had no fevers or chills.  Nothing improves or worsens her symptoms.  She reports is long-standing history of abdominal pain and nausea and vomiting without any clear etiology ever given to her by her physicians including gastroenterology.  The patient denies new vaginal discharge.  Her pain is moderate to severe at this time.  She reports decreased oral intake.  Heart rate on arrival 148.  Past Medical History  Diagnosis Date  . Thrombocytosis     Hem/Onc suggested 2/2 chronic hepatits and/or iron deficiency anemia  . Iron deficiency anemia   . N&V (nausea and vomiting)     and  Abdominal pain, chronic. Unclear ethiology with multiple admission and ED visits. Work up including Abdominal xray showed mostly nonspecific bowel gas pattern, CT abdomen with and without contrast (02/2011)  showed no acute process. Gastic Emptying scan (01/2010) was normal. Ultrasound of the abdomen was within normal limits. Hepatitis B viral load was undectable. HIV nonreactive. EGD (11/09/  . Abdominal pain   . Hypertension   . Hyperlipidemia   . Diabetes mellitus   . Recurrent boils   . Back pain   . OSA (obstructive sleep apnea)   . 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 Cath 12/10- no restenosis or significant CAD progression  . Irregular menses     Small ovarian follicles seen on CT(9/06)  . History of pyelonephritis     H/o GrpB Pyelonephritis (9/06) and UTI- 07/11- E.Coli, 12/10- GBS  . Abscess of tunica vaginalis    10/09- Abundant S. aureus- sensitive to all abx  . Obesity   . Asthma   . Gastritis   . Depression   . Hepatitis B, chronic     Hep BeAb+,Hep B cAb+ & Hep BsAg+ (9/06)  . Peripheral neuropathy   . Fibromyalgia   . GERD (gastroesophageal reflux disease)   . Migraine   . Anxiety   . Urinary tract infection   . Gastroparesis     secondary to poorly controlled DM, last emptying study performed 01/2010  was normal but may be falsely positive as pt was on reglan  . MI (myocardial infarction) 06/15/2006  . Blood dyscrasia     Past Surgical History  Procedure Date  . Cesarean section 1997    Family History  Problem Relation Age of Onset  . Diabetes Father     History  Substance Use Topics  . Smoking status: Former Smoker    Types: Cigarettes    Quit date: 04/24/1996  . Smokeless tobacco: Never Used   Comment: quit smoking cigarettes age 67  . Alcohol Use: Yes     03/31/11 "used marijuana last ~ 2 wk ago; only drink on occasions"    OB History    Grav Para Term Preterm Abortions TAB SAB Ect Mult Living                  Review of Systems  Gastrointestinal: Positive for  abdominal pain.  All other systems reviewed and are negative.    Allergies  Lisinopril  Home Medications   Current Outpatient Rx  Name Route Sig Dispense Refill  . ASPIRIN 325 MG PO TABS Oral Take 325 mg by mouth daily.     Marland Kitchen CLONAZEPAM 0.5 MG PO TABS Oral Take 0.5 mg by mouth 3 (three) times daily as needed. For anxiety.    Di Kindle SULFATE 325 (65 FE) MG PO TABS Oral Take 325 mg by mouth daily with breakfast.    . FLUTICASONE-SALMETEROL 100-50 MCG/DOSE IN AEPB Inhalation Inhale 1 puff into the lungs every 12 (twelve) hours.      . INSULIN GLARGINE 100 UNIT/ML [place] SOLN Subcutaneous Inject 40 Units into the skin at bedtime. 10 mL   . LABETALOL HCL 100 MG PO TABS Oral Take 0.5 tablets (50 mg total) by mouth 2 (two) times daily. 15 tablet 0  . OMEPRAZOLE 40 MG PO CPDR Oral Take 1 capsule (40 mg total)  by mouth daily. 31 capsule 6  . PLAVIX 75 MG PO TABS  TAKE 1 TABLET BY MOUTH ONCE A DAY 31 tablet 0  . PROMETHAZINE HCL 25 MG RE SUPP Rectal Place 1 suppository (25 mg total) rectally every 6 (six) hours as needed for nausea. 12 each 0  . PROMETHAZINE HCL 25 MG PO TABS Oral Take 1 tablet (25 mg total) by mouth every 6 (six) hours as needed for nausea. 30 tablet 0    BP 153/102  Pulse 148  Temp 98.7 F (37.1 C) (Oral)  Resp 20  SpO2 100%  LMP 11/06/2011  Physical Exam  Nursing note and vitals reviewed. Constitutional: She is oriented to person, place, and time. She appears well-developed and well-nourished. No distress.  HENT:  Head: Normocephalic and atraumatic.  Eyes: EOM are normal.  Neck: Normal range of motion.  Cardiovascular: Regular rhythm and normal heart sounds.        Tachycardia  Pulmonary/Chest: Effort normal and breath sounds normal.  Abdominal: Soft. She exhibits no distension. There is tenderness.       Generalized abdominal tenderness without guarding or rebound  Musculoskeletal: Normal range of motion.  Neurological: She is alert and oriented to person, place, and time.  Skin: Skin is warm and dry.  Psychiatric: She has a normal mood and affect. Judgment normal.    ED Course  Procedures (including critical care time)   Date: 12/16/2011  Rate: 138  Rhythm: tachy  QRS Axis: normal  Intervals: normal  ST/T Wave abnormalities: normal  Conduction Disutrbances: none  Narrative Interpretation:   Old EKG Reviewed: tachy     Labs Reviewed  GLUCOSE, CAPILLARY - Abnormal; Notable for the following:    Glucose-Capillary 375 (*)     All other components within normal limits  CBC WITH DIFFERENTIAL - Abnormal; Notable for the following:    WBC 19.6 (*)     RBC 6.09 (*)     MCV 65.5 (*)     MCH 22.0 (*)     RDW 19.1 (*)     Platelets 483 (*)     Neutrophils Relative 87 (*)     Lymphocytes Relative 10 (*)     Neutro Abs 17.0 (*)     All other components  within normal limits  COMPREHENSIVE METABOLIC PANEL - Abnormal; Notable for the following:    CO2 17 (*)     Glucose, Bld 371 (*)     Total Protein 9.3 (*)  All other components within normal limits  LIPASE, BLOOD   Ct Abdomen Pelvis W Contrast  12/17/2011  *RADIOLOGY REPORT*  Clinical Data: Abdominal pain  CT ABDOMEN AND PELVIS WITH CONTRAST  Technique:  Multidetector CT imaging of the abdomen and pelvis was performed following the standard protocol during bolus administration of intravenous contrast.  Contrast: OMNIPAQUE IOHEXOL 300 MG/ML  SOLN  Comparison: 11/23/2011  Findings: Limited images through the lung bases demonstrate no significant appreciable abnormality. The heart size is within normal limits. No pleural or pericardial effusion. Distal esophageal wall thickening.  Low attenuation of the liver is nonspecific post contrast however suggests fatty infiltration.  Unremarkable biliary system, spleen, pancreas, adrenal glands.  Symmetric renal enhancement.  No hydronephrosis or hydroureter.  No bowel obstruction.  No CT evidence for colitis.  Normal appendix.  No free intraperitoneal air or fluid.  No lymphadenopathy.  Normal caliber vasculature.  Markedly distended, thin-walled bladder.  Displaces the uterus rightward.  Nonspecific appearance to the uterus. No acute osseous finding.  IMPRESSION: Low attenuation of the liver suggests fatty infiltration.  Marked bladder distension with displacement of the uterus rightward.  If there is concern for pelvic pathology, ultrasound should be considered.  Distal esophageal wall thickening may reflect gastroesophageal reflux disease or esophagitis.   Original Report Authenticated By: Waneta Martins, M.D.     I personally reviewed the imaging tests through PACS system  I reviewed available ER/hospitalization records thought the EMR   1. Abdominal pain   2. Dehydration       MDM  Long-standing history of abdominal pain nausea and  vomiting with multiple gastroenterology studies including ultrasound CT scans gastric emptying studies all of which have been without significant abnormality.  Patient reports his symptoms began several days ago.  Sinus tachycardia at this time.  We'll hydrate and check labs and urine.  We'll reevaluate.  At this time no acute need for CT imaging.  Will follow abdominal exam closely.  1:04 AM Patient is feeling much better at this time.  She would prefer to go home.  She is agreeable to going home with pain medicine and antinausea medicine.  Her heart rate is significantly improved.  On my evaluation her heart rate was down to 113.  Having the majority of her tachycardia was secondary to dehydration.  CT scan is without any acute pathology.  There is comment as to whether or not her bladder distention is displacing the uterus rightward, her my suspicion for pelvic pathology is very low.  She will need followup with her gastroenterologist.  I think this is more likely chronic abdominal pain with associated nausea vomiting.  Spoke with the patient's daughter outside the room who reports that her mother has these symptoms all the time and that she would feel good for several days and then to back to having nausea vomiting and abdominal pain again.       Lyanne Co, MD 12/17/11 [phone]

## 2011-12-16 NOTE — ED Notes (Signed)
Attempted to start IV x 2 was unsuccessful. Patient states she is a difficult stick. IV team notified to start IV.

## 2011-12-17 MED ORDER — ONDANSETRON 8 MG PO TBDP: 8.0000 mg | ORAL_TABLET | Freq: Three times a day (TID) | ORAL | Status: AC | PRN

## 2011-12-17 MED ORDER — IOHEXOL 300 MG/ML  SOLN
100.0000 mL | Freq: Once | INTRAMUSCULAR | Status: AC | PRN
  Administered 2011-12-17: 100 mL via INTRAVENOUS

## 2011-12-17 MED ORDER — OXYCODONE-ACETAMINOPHEN 5-325 MG PO TABS: 1.0000 | ORAL_TABLET | ORAL | Status: DC | PRN

## 2011-12-18 ENCOUNTER — Emergency Department (HOSPITAL_COMMUNITY): Payer: Medicaid Other

## 2011-12-18 ENCOUNTER — Encounter (HOSPITAL_COMMUNITY): Payer: Self-pay | Admitting: Emergency Medicine

## 2011-12-18 ENCOUNTER — Emergency Department (HOSPITAL_COMMUNITY)
Admission: EM | Admit: 2011-12-18 | Discharge: 2011-12-18 | Disposition: A | Discharge status: Home / Self Care | Payer: Medicaid Other | Attending: Emergency Medicine | Admitting: Emergency Medicine

## 2011-12-18 DIAGNOSIS — R112 Nausea with vomiting, unspecified: Secondary | ICD-10-CM | POA: Insufficient documentation

## 2011-12-18 DIAGNOSIS — E785 Hyperlipidemia, unspecified: Secondary | ICD-10-CM | POA: Insufficient documentation

## 2011-12-18 DIAGNOSIS — R1032 Left lower quadrant pain: Secondary | ICD-10-CM | POA: Insufficient documentation

## 2011-12-18 DIAGNOSIS — I2510 Atherosclerotic heart disease of native coronary artery without angina pectoris: Secondary | ICD-10-CM | POA: Insufficient documentation

## 2011-12-18 DIAGNOSIS — D473 Essential (hemorrhagic) thrombocythemia: Secondary | ICD-10-CM | POA: Insufficient documentation

## 2011-12-18 DIAGNOSIS — K219 Gastro-esophageal reflux disease without esophagitis: Secondary | ICD-10-CM | POA: Insufficient documentation

## 2011-12-18 DIAGNOSIS — I10 Essential (primary) hypertension: Secondary | ICD-10-CM | POA: Insufficient documentation

## 2011-12-18 DIAGNOSIS — Z794 Long term (current) use of insulin: Secondary | ICD-10-CM | POA: Insufficient documentation

## 2011-12-18 DIAGNOSIS — E119 Type 2 diabetes mellitus without complications: Secondary | ICD-10-CM | POA: Insufficient documentation

## 2011-12-18 DIAGNOSIS — R109 Unspecified abdominal pain: Secondary | ICD-10-CM

## 2011-12-18 DIAGNOSIS — Z79899 Other long term (current) drug therapy: Secondary | ICD-10-CM | POA: Insufficient documentation

## 2011-12-18 DIAGNOSIS — I252 Old myocardial infarction: Secondary | ICD-10-CM | POA: Insufficient documentation

## 2011-12-18 DIAGNOSIS — B181 Chronic viral hepatitis B without delta-agent: Secondary | ICD-10-CM | POA: Insufficient documentation

## 2011-12-18 LAB — CBC WITH DIFFERENTIAL/PLATELET
Basophils Relative: 0 % (ref 0–1)
Eosinophils Absolute: 0.1 10*3/uL (ref 0.0–0.7)
Hemoglobin: 11.6 g/dL — ABNORMAL LOW (ref 12.0–15.0)
Lymphocytes Relative: 22 % (ref 12–46)
MCHC: 34.3 g/dL (ref 30.0–36.0)
Neutrophils Relative %: 68 % (ref 43–77)
RBC: 5.22 MIL/uL — ABNORMAL HIGH (ref 3.87–5.11)

## 2011-12-18 LAB — URINALYSIS, MICROSCOPIC ONLY
Glucose, UA: >1000 mg/dL — AB
Leukocytes, UA: NEGATIVE
Specific Gravity, Urine: 1.027 (ref 1.005–1.030)
pH: 6.5 (ref 5.0–8.0)

## 2011-12-18 LAB — COMPREHENSIVE METABOLIC PANEL
AST: 15 U/L (ref 0–37)
Albumin: 3.2 g/dL — ABNORMAL LOW (ref 3.5–5.2)
Calcium: 9.4 mg/dL (ref 8.4–10.5)
Chloride: 96 mEq/L (ref 96–112)
Creatinine, Ser: 0.67 mg/dL (ref 0.50–1.10)
Total Protein: 7.5 g/dL (ref 6.0–8.3)

## 2011-12-18 LAB — PREGNANCY, URINE: Preg Test, Ur: NEGATIVE

## 2011-12-18 MED ORDER — HYDROMORPHONE HCL PF 1 MG/ML IJ SOLN
1.0000 mg | Freq: Once | INTRAMUSCULAR | Status: AC
  Administered 2011-12-18: 1 mg via INTRAVENOUS
  Filled 2011-12-18: qty 1

## 2011-12-18 MED ORDER — SODIUM CHLORIDE 0.9 % IV BOLUS (SEPSIS)
1000.0000 mL | Freq: Once | INTRAVENOUS | Status: AC
  Administered 2011-12-18: 1000 mL via INTRAVENOUS

## 2011-12-18 MED ORDER — LORAZEPAM 2 MG/ML IJ SOLN
1.0000 mg | Freq: Once | INTRAMUSCULAR | Status: AC
  Administered 2011-12-18: 1 mg via INTRAVENOUS
  Filled 2011-12-18: qty 1

## 2011-12-18 MED ORDER — METOCLOPRAMIDE HCL 5 MG/ML IJ SOLN
10.0000 mg | Freq: Once | INTRAMUSCULAR | Status: AC
  Administered 2011-12-18: 10 mg via INTRAVENOUS
  Filled 2011-12-18: qty 2

## 2011-12-18 MED ORDER — PROMETHAZINE HCL 25 MG/ML IJ SOLN
25.0000 mg | Freq: Once | INTRAMUSCULAR | Status: AC
  Administered 2011-12-18: 25 mg via INTRAVENOUS
  Filled 2011-12-18: qty 1

## 2011-12-18 MED ORDER — PROMETHAZINE HCL 25 MG RE SUPP: 25.0000 mg | Freq: Four times a day (QID) | RECTAL | Status: DC | PRN

## 2011-12-18 MED ORDER — PROMETHAZINE HCL 25 MG PO TABS: 25.0000 mg | ORAL_TABLET | Freq: Four times a day (QID) | ORAL | Status: DC | PRN

## 2011-12-18 MED ORDER — PROMETHAZINE HCL 25 MG/ML IJ SOLN: 12.5000 mg | Freq: Once | INTRAMUSCULAR | Status: DC

## 2011-12-18 NOTE — ED Provider Notes (Signed)
36 year old female with a history of poorly controlled diabetes and chronic abdominal pain he presents with a complaint of left lower abdominal pain. This has been a persistent problem for her and according to the medical record she has been to the emergency department multiple times for the same complaints including yesterday where she had a CT scan of her abdomen and pelvis showing no signs of intra-abdominal pathology. Her white blood cell count yesterday was around 19,000, it is reduced to around 13,000 today. On my exam the patient has no abdominal pain, heart rate of 110 which is reduced from 134 on arrival. She is afebrile, normal blood pressure, normal oxygen saturations and no respiratory complaints. He is on her first liter of IV fluids, intravenous Phenergan, hydromorphone with some improvement. She does to have nausea but denies significant abdominal pain at this time. Lungs are clear, no peripheral edema. The patient states that since contracting helicobacter. Pylori in November she has lost 50 pounds and relates this to her constant nausea and vomiting. According to the medical record she almost always has a blood sugar of greater than 300.  Vida Roller, MD 12/18/11 [phone]

## 2011-12-18 NOTE — ED Provider Notes (Signed)
History     CSN: [id number]  Arrival date & time 12/18/11  0200   First MD Initiated Contact with Patient 12/18/11 [phone]      Chief Complaint  Patient presents with  . Abdominal Pain    (Consider location/radiation/quality/duration/timing/severity/associated sxs/prior treatment) Patient is a 36 y.o. female presenting with abdominal pain.  Abdominal Pain The primary symptoms of the illness include abdominal pain, nausea and vomiting. The primary symptoms of the illness do not include fever, diarrhea, dysuria, vaginal discharge or vaginal bleeding. The current episode started yesterday. The problem has not changed since onset. Pt with nausea, vomiting, left lower abdominal pain for the last several days, worsened yesterday. She was seen yesterday, had blood work and CT abdomen done with no diagnosis. She was discharged home with pain and anti emetics. Pt states she is feeling worse. She denies any fever, chest pain, sob, diarrhea.   Past Medical History  Diagnosis Date  . Thrombocytosis     Hem/Onc suggested 2/2 chronic hepatits and/or iron deficiency anemia  . Iron deficiency anemia   . N&V (nausea and vomiting)     and  Abdominal pain, chronic. Unclear ethiology with multiple admission and ED visits. Work up including Abdominal xray showed mostly nonspecific bowel gas pattern, CT abdomen with and without contrast (02/2011)  showed no acute process. Gastic Emptying scan (01/2010) was normal. Ultrasound of the abdomen was within normal limits. Hepatitis B viral load was undectable. HIV nonreactive. EGD (11/09/  . Abdominal pain   . Hypertension   . Hyperlipidemia   . Diabetes mellitus   . Recurrent boils   . Back pain   . OSA (obstructive sleep apnea)   . 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 Cath 12/10- no restenosis or significant CAD progression  . Irregular menses    Small ovarian follicles seen on CT(9/06)  . History of pyelonephritis     H/o GrpB Pyelonephritis (9/06) and UTI- 07/11- E.Coli, 12/10- GBS  . Abscess of tunica vaginalis     10/09- Abundant S. aureus- sensitive to all abx  . Obesity   . Asthma   . Gastritis   . Depression   . Hepatitis B, chronic     Hep BeAb+,Hep B cAb+ & Hep BsAg+ (9/06)  . Peripheral neuropathy   . Fibromyalgia   . GERD (gastroesophageal reflux disease)   . Migraine   . Anxiety   . Urinary tract infection   . Gastroparesis     secondary to poorly controlled DM, last emptying study performed 01/2010  was normal but may be falsely positive as pt was on reglan  . MI (myocardial infarction) 06/15/2006  . Blood dyscrasia     Past Surgical History  Procedure Date  . Cesarean section 1997    Family History  Problem Relation Age of Onset  . Diabetes Father     History  Substance Use Topics  . Smoking status: Former Smoker    Types: Cigarettes    Quit date: 04/24/1996  . Smokeless tobacco: Never Used   Comment: quit smoking cigarettes age 41  . Alcohol Use: Yes     03/31/11 "used marijuana last ~ 2 wk ago; only drink on occasions"    OB History    Grav Para Term Preterm Abortions TAB SAB Ect Mult Living  Review of Systems  Constitutional: Negative for fever.  Gastrointestinal: Positive for nausea, vomiting and abdominal pain. Negative for diarrhea.  Genitourinary: Negative for dysuria, vaginal bleeding and vaginal discharge.  All other systems reviewed and are negative.    Allergies  Lisinopril  Home Medications   Current Outpatient Rx  Name Route Sig Dispense Refill  . ASPIRIN 325 MG PO TABS Oral Take 325 mg by mouth daily.     Marland Kitchen CLOPIDOGREL BISULFATE 75 MG PO TABS Oral Take 75 mg by mouth daily.    Marland Kitchen FERROUS SULFATE 325 (65 FE) MG PO TABS Oral Take 325 mg by mouth daily with breakfast.    . FLUTICASONE-SALMETEROL 100-50 MCG/DOSE IN AEPB Inhalation Inhale 1 puff into the  lungs every 12 (twelve) hours.      . INSULIN GLARGINE 100 UNIT/ML [place] SOLN Subcutaneous Inject 40 Units into the skin at bedtime. 10 mL   . LABETALOL HCL 100 MG PO TABS Oral Take 50 mg by mouth 2 (two) times daily.    Marland Kitchen OMEPRAZOLE 40 MG PO CPDR Oral Take 1 capsule (40 mg total) by mouth daily. 31 capsule 6  . ONDANSETRON 8 MG PO TBDP Oral Take 1 tablet (8 mg total) by mouth every 8 (eight) hours as needed for nausea. 12 tablet 0  . OXYCODONE-ACETAMINOPHEN 5-325 MG PO TABS Oral Take 1 tablet by mouth every 4 (four) hours as needed for pain. 15 tablet 0    BP 160/113  Pulse 134  Temp 98.2 F (36.8 C)  Resp 20  SpO2 98%  LMP 11/23/2011  Physical Exam  Nursing note and vitals reviewed. Constitutional: She appears well-developed and well-nourished.       Crying, riding in pain  HENT:  Head: Normocephalic.  Eyes: Conjunctivae are normal.  Neck: Neck supple.  Cardiovascular: Normal rate, regular rhythm and normal heart sounds.   Pulmonary/Chest: Effort normal and breath sounds normal. No respiratory distress. She has no wheezes. She has no rales.  Abdominal: Soft. Bowel sounds are normal. She exhibits no distension. There is tenderness. There is guarding. There is no rebound.       Left lower abdominal tenderness, no CVA tenderness  Musculoskeletal: She exhibits no edema.  Neurological: She is alert.  Skin: Skin is warm and dry.  Psychiatric: She has a normal mood and affect.    ED Course  Procedures (including critical care time) Pt is tachcyardic, hypertensive, appears to be in severe pain. Fluids and pain medications ordered.    Labs Reviewed  CBC WITH DIFFERENTIAL - Abnormal; Notable for the following:    WBC 13.0 (*)     RBC 5.22 (*)     Hemoglobin 11.6 (*)     HCT 33.8 (*)     MCV 64.8 (*)     MCH 22.2 (*)     RDW 18.8 (*)     Neutro Abs 8.8 (*)     Monocytes Absolute 1.2 (*)     All other components within normal limits  COMPREHENSIVE METABOLIC PANEL - Abnormal;  Notable for the following:    Sodium 134 (*)     Glucose, Bld 330 (*)     Albumin 3.2 (*)     Total Bilirubin 0.2 (*)     All other components within normal limits  URINALYSIS, WITH MICROSCOPIC - Abnormal; Notable for the following:    APPearance CLOUDY (*)     Glucose, UA >1000 (*)     Bacteria, UA MANY (*)  Squamous Epithelial / LPF MANY (*)     All other components within normal limits  LIPASE, BLOOD  PREGNANCY, URINE  GC/CHLAMYDIA PROBE AMP, GENITAL   Ct Abdomen Pelvis W Contrast  12/17/2011  *RADIOLOGY REPORT*  Clinical Data: Abdominal pain  CT ABDOMEN AND PELVIS WITH CONTRAST  Technique:  Multidetector CT imaging of the abdomen and pelvis was performed following the standard protocol during bolus administration of intravenous contrast.  Contrast: OMNIPAQUE IOHEXOL 300 MG/ML  SOLN  Comparison: 11/23/2011  Findings: Limited images through the lung bases demonstrate no significant appreciable abnormality. The heart size is within normal limits. No pleural or pericardial effusion. Distal esophageal wall thickening.  Low attenuation of the liver is nonspecific post contrast however suggests fatty infiltration.  Unremarkable biliary system, spleen, pancreas, adrenal glands.  Symmetric renal enhancement.  No hydronephrosis or hydroureter.  No bowel obstruction.  No CT evidence for colitis.  Normal appendix.  No free intraperitoneal air or fluid.  No lymphadenopathy.  Normal caliber vasculature.  Markedly distended, thin-walled bladder.  Displaces the uterus rightward.  Nonspecific appearance to the uterus. No acute osseous finding.  IMPRESSION: Low attenuation of the liver suggests fatty infiltration.  Marked bladder distension with displacement of the uterus rightward.  If there is concern for pelvic pathology, ultrasound should be considered.  Distal esophageal wall thickening may reflect gastroesophageal reflux disease or esophagitis.   Original Report Authenticated By: Waneta Martins, M.D.    Dg Abd Acute W/chest  12/18/2011  *RADIOLOGY REPORT*  Clinical Data: Lower abdominal pain  ACUTE ABDOMEN SERIES (ABDOMEN 2 VIEW & CHEST 1 VIEW)  Comparison: 12/17/2011 CT  Findings: The lungs are clear.  Cardiomediastinal contours are within normal limits.  No free intraperitoneal air.  Contrast within the colon from recent CT scan.  Bowel gas pattern nonobstructive.  Organ outlines normal where seen.  No acute osseous finding.  IMPRESSION: Nonobstructive bowel gas pattern.  Contrast within the colon from yesterday's CT scan.   Original Report Authenticated By: Waneta Martins, M.D.    Pt with chronic pain. NEgative CT abdomen yesterday. She is feeling better with medications. HR down to 104 with fluids. She is tolerating POs. Pt wants to go home. Will d/c home with phenergan. No abdominal tenderness on reassessment, it is soft, no guarding. Follow up with pcp as soon a able.    1. Abdominal pain   2. Nausea & vomiting       MDM  Pt with recurrent abdominal pain, vomiting. Her abdomen is soft, non tender on re evaluation. She is feeling better. WBC improving. Doubt surgical abdomen. Follow up outpatient with pcp as needed.         Lottie Mussel, [place] 12/20/11 [phone]

## 2011-12-18 NOTE — ED Notes (Signed)
Pt alert, arrives from home via EMS, c/o nausea, abd pain, onset was yesterday, describes pain as sharp left sided abd pain, resp even unlabored skin, seen in ED a few days ago in ED

## 2011-12-20 NOTE — ED Provider Notes (Signed)
Medical screening examination/treatment/procedure(s) were conducted as a shared visit with non-physician practitioner(s) and myself.  I personally evaluated the patient during the encounter  Please see my separate respective documentation pertaining to this patient encounter   Vida Roller, MD 12/20/11 1534

## 2011-12-25 ENCOUNTER — Emergency Department (HOSPITAL_COMMUNITY)
Admission: EM | Admit: 2011-12-25 | Discharge: 2011-12-25 | Disposition: A | Discharge status: Home / Self Care | Payer: Medicaid Other | Attending: Emergency Medicine | Admitting: Emergency Medicine

## 2011-12-25 ENCOUNTER — Encounter (HOSPITAL_COMMUNITY): Payer: Self-pay | Admitting: Radiology

## 2011-12-25 DIAGNOSIS — I2510 Atherosclerotic heart disease of native coronary artery without angina pectoris: Secondary | ICD-10-CM | POA: Insufficient documentation

## 2011-12-25 DIAGNOSIS — E669 Obesity, unspecified: Secondary | ICD-10-CM | POA: Insufficient documentation

## 2011-12-25 DIAGNOSIS — G8929 Other chronic pain: Secondary | ICD-10-CM | POA: Insufficient documentation

## 2011-12-25 DIAGNOSIS — E119 Type 2 diabetes mellitus without complications: Secondary | ICD-10-CM | POA: Insufficient documentation

## 2011-12-25 DIAGNOSIS — I10 Essential (primary) hypertension: Secondary | ICD-10-CM | POA: Insufficient documentation

## 2011-12-25 DIAGNOSIS — I252 Old myocardial infarction: Secondary | ICD-10-CM | POA: Insufficient documentation

## 2011-12-25 DIAGNOSIS — R112 Nausea with vomiting, unspecified: Secondary | ICD-10-CM

## 2011-12-25 DIAGNOSIS — R109 Unspecified abdominal pain: Secondary | ICD-10-CM | POA: Insufficient documentation

## 2011-12-25 DIAGNOSIS — J45909 Unspecified asthma, uncomplicated: Secondary | ICD-10-CM | POA: Insufficient documentation

## 2011-12-25 LAB — CBC WITH DIFFERENTIAL/PLATELET
Basophils Relative: 0 % (ref 0–1)
Eosinophils Relative: 1 % (ref 0–5)
Hemoglobin: 12.6 g/dL (ref 12.0–15.0)
Monocytes Relative: 7 % (ref 3–12)
Neutrophils Relative %: 75 % (ref 43–77)
Platelets: 399 10*3/uL (ref 150–400)
RBC: 5.58 MIL/uL — ABNORMAL HIGH (ref 3.87–5.11)
WBC: 15.5 10*3/uL — ABNORMAL HIGH (ref 4.0–10.5)

## 2011-12-25 LAB — HEPATIC FUNCTION PANEL
AST: 20 U/L (ref 0–37)
Albumin: 3.4 g/dL — ABNORMAL LOW (ref 3.5–5.2)
Bilirubin, Direct: <0.1 mg/dL (ref 0.0–0.3)
Total Bilirubin: 0.5 mg/dL (ref 0.3–1.2)

## 2011-12-25 LAB — BASIC METABOLIC PANEL
BUN: 12 mg/dL (ref 6–23)
Calcium: 9.7 mg/dL (ref 8.4–10.5)
GFR calc Af Amer: >90 mL/min (ref >90)
GFR calc non Af Amer: >90 mL/min (ref >90)
Glucose, Bld: 232 mg/dL — ABNORMAL HIGH (ref 70–99)
Potassium: 4.1 mEq/L (ref 3.5–5.1)
Sodium: 129 mEq/L — ABNORMAL LOW (ref 135–145)

## 2011-12-25 LAB — LIPASE, BLOOD: Lipase: 24 U/L (ref 11–59)

## 2011-12-25 MED ORDER — LACTATED RINGERS IV BOLUS (SEPSIS)
2000.0000 mL | Freq: Once | INTRAVENOUS | Status: AC
  Administered 2011-12-25: 1000 mL via INTRAVENOUS

## 2011-12-25 MED ORDER — HYDROMORPHONE HCL PF 1 MG/ML IJ SOLN
1.0000 mg | INTRAMUSCULAR | Status: AC
  Administered 2011-12-25: 1 mg via INTRAVENOUS
  Filled 2011-12-25: qty 1

## 2011-12-25 MED ORDER — ONDANSETRON HCL 4 MG/2ML IJ SOLN
INTRAMUSCULAR | Status: AC
  Administered 2011-12-25: 17:00:00
  Filled 2011-12-25: qty 2

## 2011-12-25 MED ORDER — METOCLOPRAMIDE HCL 5 MG/ML IJ SOLN
10.0000 mg | Freq: Once | INTRAMUSCULAR | Status: AC
  Administered 2011-12-25: 10 mg via INTRAVENOUS
  Filled 2011-12-25: qty 2

## 2011-12-25 MED ORDER — METOCLOPRAMIDE HCL 10 MG PO TABS: 5.0000 mg | ORAL_TABLET | Freq: Two times a day (BID) | ORAL | Status: DC

## 2011-12-25 MED ORDER — POLYETHYLENE GLYCOL 3350 17 GM/SCOOP PO POWD: 17.0000 g | Freq: Every day | ORAL | Status: AC

## 2011-12-25 MED ORDER — OXYCODONE-ACETAMINOPHEN 5-325 MG PO TABS: 2.0000 | ORAL_TABLET | ORAL | Status: AC | PRN

## 2011-12-25 MED ORDER — DIPHENHYDRAMINE HCL 50 MG/ML IJ SOLN
12.5000 mg | Freq: Once | INTRAMUSCULAR | Status: AC
  Administered 2011-12-25: 12.5 mg via INTRAVENOUS
  Filled 2011-12-25: qty 1

## 2011-12-25 NOTE — ED Notes (Signed)
MD at bedside. 

## 2011-12-25 NOTE — ED Notes (Signed)
Prescriptions x3 given with discharge instructions.  

## 2011-12-25 NOTE — ED Provider Notes (Signed)
History     CSN: [id number]  Arrival date & time 12/25/11  1423   First MD Initiated Contact with Patient 12/25/11 1441      Chief Complaint  Patient presents with  . Chest Pain  . Abdominal Pain    (Consider location/radiation/quality/duration/timing/severity/associated sxs/prior treatment)  HPI Shirley [name] is a 36 y.o. female presenting with nausea vomiting and abdominal pain.  Per the patient, her pain and symptoms are exactly the same as previous presentations to the ED over the last week 8/24 and 8/26.  Pt reports this pain in the LLQ is now a 10/10, sharp, non radiating, associated with nausea and vomiting, worse with vomiting, not positional, no fevers, no chills or diarrhea.  She denies chest pain - she does have chest pain only when she vomits at the left costal angle.  No dysuria, vaginal DC, on menstrual cycle now, no dyspnea.    She says she has had decreased po intake.  She hasn't eatenn much since last Friday, she can keep foods down for 30-40 minutes and then vomits.  This has been an ongoing problem since she was diagnosed with H. Pylori about a year ago.   Past Medical History  Diagnosis Date  . Thrombocytosis     Hem/Onc suggested 2/2 chronic hepatits and/or iron deficiency anemia  . Iron deficiency anemia   . N&V (nausea and vomiting)     and  Abdominal pain, chronic. Unclear ethiology with multiple admission and ED visits. Work up including Abdominal xray showed mostly nonspecific bowel gas pattern, CT abdomen with and without contrast (02/2011)  showed no acute process. Gastic Emptying scan (01/2010) was normal. Ultrasound of the abdomen was within normal limits. Hepatitis B viral load was undectable. HIV nonreactive. EGD (11/09/  . Abdominal pain   . Hypertension   . Hyperlipidemia   . Diabetes mellitus   . Recurrent boils   . Back pain   . OSA (obstructive sleep apnea)   . CAD (coronary artery disease)     s/p Subendocardial MI with PDA angioplasty(no  stent) on 06/15/06 and relook  cath 06/19/06 showed patency of site. Cath 12/10- no restenosis or significant CAD progression  . Irregular menses     Small ovarian follicles seen on CT(9/06)  . History of pyelonephritis     H/o GrpB Pyelonephritis (9/06) and UTI- 07/11- E.Coli, 12/10- GBS  . Abscess of tunica vaginalis     10/09- Abundant S. aureus- sensitive to all abx  . Obesity   . Asthma   . Gastritis   . Depression   . Hepatitis B, chronic     Hep BeAb+,Hep B cAb+ & Hep BsAg+ (9/06)  . Peripheral neuropathy   . Fibromyalgia   . GERD (gastroesophageal reflux disease)   . Migraine   . Anxiety   . Urinary tract infection   . Gastroparesis     secondary to poorly controlled DM, last emptying study performed 01/2010  was normal but may be falsely positive as pt was on reglan  . MI (myocardial infarction) 06/15/2006  . Blood dyscrasia     Past Surgical History  Procedure Date  . Cesarean section 1997    Family History  Problem Relation Age of Onset  . Diabetes Father     History  Substance Use Topics  . Smoking status: Former Smoker    Types: Cigarettes    Quit date: 04/24/1996  . Smokeless tobacco: Never Used   Comment: quit smoking cigarettes age 89  .  Alcohol Use: Yes     03/31/11 "used marijuana last ~ 2 wk ago; only drink on occasions"    OB History    Grav Para Term Preterm Abortions TAB SAB Ect Mult Living                  Review of Systems Positive for abdominal pain, nausea and vomiting; Patient denies any fevers or chills, changes in vision, earache, sore throat, neck pain or stiffness, chest pain or pressure, palpitations, syncope, dyspnea, cough, wheezing, diarrhea, melena, red bloody stools, frequency, dysuria, myalgias, arthralgias, back pain, recent trauma, rash, itching, skin lesions, easy bruising or bleeding, headache, seizures, numbness, tingling or weakness.   Allergies  Lisinopril  Home Medications   Current Outpatient Rx  Name Route Sig  Dispense Refill  . ALBUTEROL SULFATE HFA 108 (90 BASE) MCG/ACT IN AERS Inhalation Inhale 2 puffs into the lungs every 4 (four) hours as needed. For shortness of breath    . ASPIRIN 325 MG PO TABS Oral Take 325 mg by mouth daily.     Marland Kitchen CLOPIDOGREL BISULFATE 75 MG PO TABS Oral Take 75 mg by mouth daily.    Marland Kitchen FERROUS SULFATE 325 (65 FE) MG PO TABS Oral Take 325 mg by mouth daily with breakfast.    . FLUTICASONE-SALMETEROL 100-50 MCG/DOSE IN AEPB Inhalation Inhale 1 puff into the lungs every 12 (twelve) hours.      . IBUPROFEN 200 MG PO TABS Oral Take 400 mg by mouth every 6 (six) hours as needed. For pain    . INSULIN GLARGINE 100 UNIT/ML [place] SOLN Subcutaneous Inject 40 Units into the skin at bedtime. 10 mL   . LABETALOL HCL 100 MG PO TABS Oral Take 50 mg by mouth 2 (two) times daily.    Marland Kitchen OMEPRAZOLE 40 MG PO CPDR Oral Take 1 capsule (40 mg total) by mouth daily. 31 capsule 6  . PROMETHAZINE HCL 25 MG PO TABS Oral Take 25 mg by mouth every 6 (six) hours as needed. For nausea    . METOCLOPRAMIDE HCL 10 MG PO TABS Oral Take 1 tablet (10 mg total) by mouth 3 (three) times daily before meals. 90 tablet 0  . METOCLOPRAMIDE HCL 10 MG PO TABS Oral Take 0.5 tablets (5 mg total) by mouth 2 (two) times daily before a meal. 15 tablet 0  . ONDANSETRON 8 MG PO TBDP Oral Take 1 tablet (8 mg total) by mouth every 8 (eight) hours as needed for nausea. 12 tablet 0  . OXYCODONE-ACETAMINOPHEN 5-325 MG PO TABS Oral Take 2 tablets by mouth every 4 (four) hours as needed for pain. 10 tablet 0  . POLYETHYLENE GLYCOL 3350 PO POWD Oral Take 17 g by mouth daily. 255 g 0    BP 124/72  Pulse 83  Temp 98.1 F (36.7 C) (Oral)  Resp 19  SpO2 100%  LMP 11/23/2011  Physical Exam VITAL SIGNS:   Filed Vitals:   12/25/11 2022  BP: 124/72  Pulse: 83  Temp:   Resp: 19   CONSTITUTIONAL: Awake, oriented, appears non-toxic but uncomfortable. HENT: Atraumatic, normocephalic, oral mucosa pink and moist, airway patent. Nares  patent without drainage. External ears normal. EYES: Conjunctiva clear, EOMI, PERRLA NECK: Trachea midline, non-tender, supple CARDIOVASCULAR: Normal heart rate, Normal rhythm, No murmurs, rubs, gallops PULMONARY/CHEST: Clear to auscultation, no rhonchi, wheezes, or rales. Symmetrical breath sounds. Non-tender. ABDOMINAL: Non-distended, soft, tender in the left lower quadrant- no rebound or guarding.  BS normal. NEUROLOGIC: Non-focal, moving all  four extremities, no gross sensory or motor deficits. EXTREMITIES: No clubbing, cyanosis, or edema SKIN: Warm, Dry, No erythema, No rash  ED Course  Procedures (including critical care time)  Labs Reviewed  BASIC METABOLIC PANEL - Abnormal; Notable for the following:    Sodium 129 (*)     Chloride 92 (*)     Glucose, Bld 232 (*)     All other components within normal limits  HEPATIC FUNCTION PANEL - Abnormal; Notable for the following:    Total Protein 8.4 (*)     Albumin 3.4 (*)     All other components within normal limits  CBC WITH DIFFERENTIAL - Abnormal; Notable for the following:    WBC 15.5 (*)  WHITE COUNT CONFIRMED ON SMEAR   RBC 5.58 (*)     MCV 66.1 (*)     MCH 22.6 (*)     RDW 18.4 (*)     Neutro Abs 11.6 (*)     Monocytes Absolute 1.1 (*)     All other components within normal limits  LIPASE, BLOOD   No results found.   1. NAUSEA AND VOMITING   2. Chronic abdominal pain     MDM  Shirley [name] is a 36 y.o. female with a complicated and extensive medical history.  She has had multiple GI work ups and 16 CT's of her abdomen and pelvis over the past 7 years, many within the last year.  She's had 2 NM gastric emptying studies with conflicting results.  Pt's symptoms are certainly c/w gastroparesis.  Pt abdomen is tender, but she is non-toxic, afebrile, and last CT showed no evidence of diverticulitis.  I don't thinkn an additional CT will yield any diagnostic benefit.  Labs show a hyponatremia which corrects to 132 -  likely secondary to chronically elevated glucose, pt has received 2L LR which contains an adequate sodium replacement.  WBC is elevated, but has been elevated and patient's history does not lead to a source of bacterial infection - pt has had elevated WBC count over the last week likely from vomiting. No cough, fevers, dysuria or vaginal DC. Pt responded well to fluids, reglan and pain medicine.    I had a long discussion with the patient at bedside about not pursuing an ED workup further with CT scan due to radiation risk and low diagnostic yield.  She agreed with the risk/benefit decision to not get a scan.  Likewise, after eating some crackers and drinking some juice, no further workup will be undertaken.  She will follow up with [facility] - she'll start taking prilosec BID, use Reglan prior to meals, Use Glycolax as needed until she is having daily BM's and stick to a clear liquid diet and advance slowly or when GI tells there to advance.   I explained the diagnosis in detail and have given ER return precautions including worsening abdominal pain, inability to keep fluids down, hematemesis, or any other new or worsening symptoms. The patient understands and accepts the medical plan as it's been dictated and I have answered all questions. Discharge instructions concerning home care and prescriptions have been given.  The patient is STABLE and is discharged to home in good condition.        Jones Skene, MD 12/25/11 2153

## 2011-12-25 NOTE — ED Notes (Signed)
Reviewed discharge instructions with pt and family. Verbalized understanding.

## 2011-12-25 NOTE — ED Notes (Addendum)
Pt presents with abd pain LLQ X November. Pt reports N/V. Pt describes vomit as being black in nature. Pt reports chest pain when EMS arrived. Pt denies SHOB.lightheadness, V since CP. Pt started menses today.  Pt received  4mg  zofran. 324 asa . Nitro X 1

## 2012-01-18 ENCOUNTER — Other Ambulatory Visit: Payer: Self-pay | Admitting: Internal Medicine

## 2012-01-22 ENCOUNTER — Ambulatory Visit (INDEPENDENT_AMBULATORY_CARE_PROVIDER_SITE_OTHER): Payer: Medicaid Other | Admitting: Internal Medicine

## 2012-01-22 ENCOUNTER — Encounter: Payer: Medicaid Other | Admitting: Internal Medicine

## 2012-01-22 VITALS — BP 139/96 | HR 100 | Temp 98.4°F | Ht 67.0 in | Wt 217.3 lb

## 2012-01-22 DIAGNOSIS — E119 Type 2 diabetes mellitus without complications: Secondary | ICD-10-CM

## 2012-01-22 DIAGNOSIS — I10 Essential (primary) hypertension: Secondary | ICD-10-CM

## 2012-01-22 DIAGNOSIS — J45909 Unspecified asthma, uncomplicated: Secondary | ICD-10-CM

## 2012-01-22 DIAGNOSIS — R112 Nausea with vomiting, unspecified: Secondary | ICD-10-CM

## 2012-01-22 DIAGNOSIS — R1032 Left lower quadrant pain: Secondary | ICD-10-CM

## 2012-01-22 DIAGNOSIS — L0292 Furuncle, unspecified: Secondary | ICD-10-CM

## 2012-01-22 DIAGNOSIS — Z0000 Encounter for general adult medical examination without abnormal findings: Secondary | ICD-10-CM | POA: Insufficient documentation

## 2012-01-22 DIAGNOSIS — F341 Dysthymic disorder: Secondary | ICD-10-CM

## 2012-01-22 DIAGNOSIS — L0293 Carbuncle, unspecified: Secondary | ICD-10-CM

## 2012-01-22 LAB — POCT GLYCOSYLATED HEMOGLOBIN (HGB A1C): Hemoglobin A1C: >14

## 2012-01-22 LAB — GLUCOSE, CAPILLARY: Glucose-Capillary: 443 mg/dL — ABNORMAL HIGH (ref 70–99)

## 2012-01-22 MED ORDER — AMITRIPTYLINE HCL 150 MG PO TABS: 150.0000 mg | ORAL_TABLET | Freq: Every evening | ORAL | Status: DC | PRN

## 2012-01-22 MED ORDER — GLUCOSE BLOOD VI STRP: ORAL_STRIP | Status: DC

## 2012-01-22 MED ORDER — AMITRIPTYLINE HCL 25 MG PO TABS: 25.0000 mg | ORAL_TABLET | Freq: Every evening | ORAL | Status: DC | PRN

## 2012-01-22 MED ORDER — ALBUTEROL SULFATE HFA 108 (90 BASE) MCG/ACT IN AERS: 2.0000 | INHALATION_SPRAY | RESPIRATORY_TRACT | Status: DC | PRN

## 2012-01-22 MED ORDER — ACCU-CHEK FASTCLIX LANCETS MISC: 1.0000 | Status: DC | PRN

## 2012-01-22 MED ORDER — FLUTICASONE-SALMETEROL 100-50 MCG/DOSE IN AEPB: 1.0000 | INHALATION_SPRAY | Freq: Two times a day (BID) | RESPIRATORY_TRACT | Status: DC

## 2012-01-22 MED ORDER — CLOPIDOGREL BISULFATE 75 MG PO TABS: 75.0000 mg | ORAL_TABLET | Freq: Every day | ORAL | Status: DC

## 2012-01-22 MED ORDER — ACCU-CHEK NANO SMARTVIEW W/DEVICE KIT: 1.0000 | PACK | Status: DC | PRN

## 2012-01-22 MED ORDER — HYDROCODONE-ACETAMINOPHEN 5-325 MG PO TABS: 1.0000 | ORAL_TABLET | ORAL | Status: AC | PRN

## 2012-01-22 NOTE — Assessment & Plan Note (Signed)
This is consistent with hidradenitis suppurativa. Educated on need for good hygiene, weight reduction, anti-septic such as facial astringent, loosefitting close and cornstarch to prevent maceration of skin underneath her skin folds.

## 2012-01-22 NOTE — Assessment & Plan Note (Signed)
Requesting a refill of Klonopin. Per her chart records and affect she has not been on this for some time now. I have started her on amitriptyline 150 mg each bedtime of which she reports taking in the past as well.

## 2012-01-22 NOTE — Assessment & Plan Note (Signed)
Blood pressure 139/96 with pulse 100 today. She reports she has run out of her labetalol thus has not taken for some time now. Will refill labetalol today.

## 2012-01-22 NOTE — Assessment & Plan Note (Addendum)
HgbA1c >14 today, reports that her glucometer was lost.  Reorder glucometer, lancets, and strips. Cont Lantus  40 Units qhs, pt likely noncompliant with regimen

## 2012-01-22 NOTE — Assessment & Plan Note (Signed)
Ran out of inhalers, no c/o of sob  -refilled Advair and Albuterol inhalers today

## 2012-01-22 NOTE — Assessment & Plan Note (Addendum)
Chronic with multiple emergency department admissions. On Reglan and omeprazole. Previous negative gastric emptying study, normal ultrasound of the abdomen, hepatitis B viral load undetectable, HIV nonreactive, EGD in 2011 with mild gastritis positive for H. pylori. Patient states that she cannot be seen by GI until she gets a referral. Referral given today for chronic nausea vomiting and left-sided abdominal pain. Will have her continue with the Phenergan and refill given for hydrocodone.

## 2012-01-22 NOTE — Patient Instructions (Addendum)
We will try to schedule you with a gut doctor as soon as possible.  It is VERY important to keep the appointment because they can refuse to schedule any more appointments.

## 2012-01-22 NOTE — Assessment & Plan Note (Signed)
Declines flu shot today. We'll plan for diabetic foot exam and Pap smear on next office visit the patient shows a scheduled. (she was >35 minutes late today). Will refer for eye exam.

## 2012-01-22 NOTE — Progress Notes (Signed)
  Subjective:    Patient ID: Shirley [name], female    DOB: [birth year], 36 y.o.   MRN: [id number]  HPI Presents for a post ED visit for continued nausea and vomiting and chronic abdominal pain. Patient has had multiple ED visits in the past years and thus far negative workup with exception of mild gastritis and H. pylori. She is highly noncompliant with her medical regimen. His request a referral for her gastroenterologist to be further evaluated. Of note she was scheduled to see Dr. Russella Dar of [facility] approximally a year ago and was a no-show. I expressed to her that it was very important that she keep her appointment otherwise no GI doctors would be available to evaluate her. He reports the same left lower abdomen pain associated with nausea and vomiting. She is supposed to be on Reglan and Protonix. Unclear exactly how compliant she is with this therapy and she reports that she needs to go pick up the Protonix.  She is out of most of her medications today and reports that she no longer has a glucometer.   Review of Systems  Constitutional: Negative for fever.  HENT: Negative for congestion.   Eyes: Negative for visual disturbance.  Respiratory: Negative for shortness of breath.   Cardiovascular: Negative for chest pain.  Gastrointestinal: Negative for abdominal distention.       Objective:   Physical Exam  Constitutional: She is oriented to person, place, and time. She appears well-developed.       Morbidly obese  HENT:  Head: Normocephalic and atraumatic.  Neck: Normal range of motion. Neck supple.  Cardiovascular: Regular rhythm and normal heart sounds.        Slightly tachycardic  Pulmonary/Chest: Effort normal and breath sounds normal.  Abdominal: Soft. She exhibits no distension and no mass. There is tenderness. There is no rebound and no guarding.       Hypoactive bowel sounds, LL and LUQ tender to palpation  Neurological: She is alert and oriented to person, place, and  time.  Skin: Skin is warm and dry.       Hyperpigmentation from prior boils noted under bilateral breast, with a healing boil that was covered with band-aid.  Psychiatric: She has a normal mood and affect.          Assessment & Plan:  1. Uncontrolled DM 2: 2. Chronic abdominal pain: 3. Chronic nausea and vomiting: secondary to abdominal pain 4. Hidradenitis suppurativa 5. CAD s/p ptca:  6. Asthma: on advair

## 2012-01-24 ENCOUNTER — Telehealth: Payer: Self-pay | Admitting: *Deleted

## 2012-01-24 NOTE — Telephone Encounter (Signed)
Pt was scheduled with [facility] for a GI referral.  Attempts on 01/23/2012 and 01/24/2012 x 6 messages were left .  No return call from pt to notify her of her appointment.  Call to [facility] appointment cancelled due to inability to locate patient to inform her of the appointment.  Will call office to reschedule when patient contacts the Clinics.   Pt was called message left to inform her of the cancellation. Angelina Ok, RN 01/24/2012 10:06 AM.

## 2012-01-26 ENCOUNTER — Emergency Department (HOSPITAL_COMMUNITY)
Admission: EM | Admit: 2012-01-26 | Discharge: 2012-01-27 | Disposition: A | Discharge status: Home / Self Care | Payer: Medicaid Other | Attending: Emergency Medicine | Admitting: Emergency Medicine

## 2012-01-26 ENCOUNTER — Encounter (HOSPITAL_COMMUNITY): Payer: Self-pay | Admitting: *Deleted

## 2012-01-26 DIAGNOSIS — R112 Nausea with vomiting, unspecified: Secondary | ICD-10-CM

## 2012-01-26 DIAGNOSIS — R10819 Abdominal tenderness, unspecified site: Secondary | ICD-10-CM | POA: Insufficient documentation

## 2012-01-26 DIAGNOSIS — F341 Dysthymic disorder: Secondary | ICD-10-CM

## 2012-01-26 DIAGNOSIS — I2510 Atherosclerotic heart disease of native coronary artery without angina pectoris: Secondary | ICD-10-CM

## 2012-01-26 DIAGNOSIS — R109 Unspecified abdominal pain: Secondary | ICD-10-CM | POA: Insufficient documentation

## 2012-01-26 DIAGNOSIS — G8929 Other chronic pain: Secondary | ICD-10-CM

## 2012-01-26 DIAGNOSIS — E119 Type 2 diabetes mellitus without complications: Secondary | ICD-10-CM

## 2012-01-26 DIAGNOSIS — Z794 Long term (current) use of insulin: Secondary | ICD-10-CM | POA: Insufficient documentation

## 2012-01-26 LAB — URINALYSIS, ROUTINE W REFLEX MICROSCOPIC
Bilirubin Urine: NEGATIVE
Glucose, UA: >1000 mg/dL — AB
Hgb urine dipstick: NEGATIVE
Ketones, ur: 40 mg/dL — AB
Leukocytes, UA: NEGATIVE
Nitrite: NEGATIVE
Specific Gravity, Urine: 1.027 (ref 1.005–1.030)
Urobilinogen, UA: 1 mg/dL (ref 0.0–1.0)
pH: 6.5 (ref 5.0–8.0)

## 2012-01-26 LAB — URINE MICROSCOPIC-ADD ON

## 2012-01-26 LAB — CBC WITH DIFFERENTIAL/PLATELET
Basophils Absolute: 0 10*3/uL (ref 0.0–0.1)
Eosinophils Absolute: 0 10*3/uL (ref 0.0–0.7)
HCT: 34.8 % — ABNORMAL LOW (ref 36.0–46.0)
Lymphs Abs: 1.3 10*3/uL (ref 0.7–4.0)
MCH: 22.1 pg — ABNORMAL LOW (ref 26.0–34.0)
MCHC: 33.6 g/dL (ref 30.0–36.0)
MCV: 65.7 fL — ABNORMAL LOW (ref 78.0–100.0)
Monocytes Absolute: 0.4 10*3/uL (ref 0.1–1.0)
Neutro Abs: 8.8 10*3/uL — ABNORMAL HIGH (ref 1.7–7.7)
Platelets: 405 10*3/uL — ABNORMAL HIGH (ref 150–400)
RDW: 18.9 % — ABNORMAL HIGH (ref 11.5–15.5)

## 2012-01-26 LAB — COMPREHENSIVE METABOLIC PANEL
Albumin: 3 g/dL — ABNORMAL LOW (ref 3.5–5.2)
Alkaline Phosphatase: 69 U/L (ref 39–117)
BUN: 5 mg/dL — ABNORMAL LOW (ref 6–23)
Calcium: 9 mg/dL (ref 8.4–10.5)
Creatinine, Ser: 0.5 mg/dL (ref 0.50–1.10)
GFR calc Af Amer: >90 mL/min (ref >90)
Glucose, Bld: 255 mg/dL — ABNORMAL HIGH (ref 70–99)
Potassium: 3.9 mEq/L (ref 3.5–5.1)
Total Protein: 7.6 g/dL (ref 6.0–8.3)

## 2012-01-26 LAB — LIPASE, BLOOD: Lipase: 27 U/L (ref 11–59)

## 2012-01-26 MED ORDER — METOCLOPRAMIDE HCL 5 MG/ML IJ SOLN
10.0000 mg | Freq: Once | INTRAMUSCULAR | Status: AC
  Administered 2012-01-26: 10 mg via INTRAVENOUS
  Filled 2012-01-26: qty 2

## 2012-01-26 MED ORDER — ONDANSETRON HCL 4 MG/2ML IJ SOLN
INTRAMUSCULAR | Status: AC
  Administered 2012-01-26: 05:00:00
  Filled 2012-01-26: qty 2

## 2012-01-26 MED ORDER — SODIUM CHLORIDE 0.9 % IV BOLUS (SEPSIS)
500.0000 mL | Freq: Once | INTRAVENOUS | Status: AC
  Administered 2012-01-26: 500 mL via INTRAVENOUS

## 2012-01-26 MED ORDER — METOCLOPRAMIDE HCL 5 MG/5ML PO SOLN
10.0000 mg | Freq: Once | ORAL | Status: DC
  Filled 2012-01-26: qty 10

## 2012-01-26 MED ORDER — MORPHINE SULFATE 4 MG/ML IJ SOLN
4.0000 mg | Freq: Once | INTRAMUSCULAR | Status: AC
  Administered 2012-01-26: 4 mg via INTRAVENOUS
  Filled 2012-01-26: qty 1

## 2012-01-26 MED ORDER — PROMETHAZINE HCL 25 MG PO TABS: 25.0000 mg | ORAL_TABLET | Freq: Four times a day (QID) | ORAL | Status: DC | PRN

## 2012-01-26 MED ORDER — SODIUM CHLORIDE 0.9 % IV BOLUS (SEPSIS)
1000.0000 mL | Freq: Once | INTRAVENOUS | Status: AC
  Administered 2012-01-26: 1000 mL via INTRAVENOUS

## 2012-01-26 MED ORDER — PROMETHAZINE HCL 25 MG/ML IJ SOLN
25.0000 mg | Freq: Once | INTRAMUSCULAR | Status: AC
  Administered 2012-01-26: 25 mg via INTRAVENOUS
  Filled 2012-01-26: qty 1

## 2012-01-26 MED ORDER — SODIUM CHLORIDE 0.9 % IV BOLUS (SEPSIS): 1000.0000 mL | Freq: Once | INTRAVENOUS | Status: DC

## 2012-01-26 MED ORDER — LORAZEPAM 2 MG/ML IJ SOLN
1.0000 mg | Freq: Once | INTRAMUSCULAR | Status: AC
  Administered 2012-01-26: 1 mg via INTRAVENOUS
  Filled 2012-01-26: qty 1

## 2012-01-26 MED ORDER — DEXTROSE 5 % IV SOLN
1.0000 g | Freq: Once | INTRAVENOUS | Status: AC
  Administered 2012-01-26: 1 g via INTRAVENOUS
  Filled 2012-01-26: qty 10

## 2012-01-26 NOTE — ED Provider Notes (Signed)
History     CSN: [id number]  Arrival date & time 01/26/12  4098   First MD Initiated Contact with Patient 01/26/12 0730      Chief Complaint  Patient presents with  . Emesis  . Abdominal Pain    (Consider location/radiation/quality/duration/timing/severity/associated sxs/prior treatment) Patient is a 36 y.o. female presenting with vomiting and abdominal pain. The history is provided by the patient.  Emesis  This is a chronic problem. Associated symptoms include abdominal pain. Pertinent negatives include no diarrhea and no headaches.  Abdominal Pain The primary symptoms of the illness include abdominal pain, nausea and vomiting. The primary symptoms of the illness do not include shortness of breath or diarrhea.  Symptoms associated with the illness do not include back pain.   patient with acute on chronic abdominal pain that began this morning. She has a history of same. She has multiple visits and evaluations for it. I there is a questionable history gastroparesis. She's been seen by GI and is perforated [facility]. No diarrhea. No fevers. She states this is the way the episodes usually start. It began at 8 AM this morning. Is worse in the lower abdomen. She is moaning and shaking on the bed.  Past Medical History  Diagnosis Date  . Thrombocytosis     Hem/Onc suggested 2/2 chronic hepatits and/or iron deficiency anemia  . Iron deficiency anemia   . N&V (nausea and vomiting)     and  Abdominal pain, chronic. Unclear ethiology with multiple admission and ED visits. Work up including Abdominal xray showed mostly nonspecific bowel gas pattern, CT abdomen with and without contrast (02/2011)  showed no acute process. Gastic Emptying scan (01/2010) was normal. Ultrasound of the abdomen was within normal limits. Hepatitis B viral load was undectable. HIV nonreactive. EGD (11/09/  . Abdominal pain   . Hypertension   . Hyperlipidemia   . Diabetes mellitus   . Recurrent boils   . Back pain    . OSA (obstructive sleep apnea)   . 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 Cath 12/10- no restenosis or significant CAD progression  . Irregular menses     Small ovarian follicles seen on CT(9/06)  . History of pyelonephritis     H/o GrpB Pyelonephritis (9/06) and UTI- 07/11- E.Coli, 12/10- GBS  . Abscess of tunica vaginalis     10/09- Abundant S. aureus- sensitive to all abx  . Obesity   . Asthma   . Gastritis   . Depression   . Hepatitis B, chronic     Hep BeAb+,Hep B cAb+ & Hep BsAg+ (9/06)  . Peripheral neuropathy   . Fibromyalgia   . GERD (gastroesophageal reflux disease)   . Migraine   . Anxiety   . Urinary tract infection   . Gastroparesis     secondary to poorly controlled DM, last emptying study performed 01/2010  was normal but may be falsely positive as pt was on reglan  . MI (myocardial infarction) 06/15/2006  . Blood dyscrasia     Past Surgical History  Procedure Date  . Cesarean section 1997    Family History  Problem Relation Age of Onset  . Diabetes Father     History  Substance Use Topics  . Smoking status: Former Smoker    Types: Cigarettes    Quit date: 04/24/1996  . Smokeless tobacco: Never Used   Comment: quit  smoking cigarettes age 10  . Alcohol Use: Yes     03/31/11 "used marijuana last ~ 2 wk ago; only drink on occasions"    OB History    Grav Para Term Preterm Abortions TAB SAB Ect Mult Living                  Review of Systems  Constitutional: Negative for activity change and appetite change.  HENT: Negative for neck stiffness.   Eyes: Negative for pain.  Respiratory: Negative for chest tightness and shortness of breath.   Cardiovascular: Negative for chest pain and leg swelling.  Gastrointestinal: Positive for nausea, vomiting and abdominal pain. Negative for diarrhea.  Genitourinary: Negative for flank pain.    Musculoskeletal: Negative for back pain.  Skin: Negative for rash.  Neurological: Negative for weakness, numbness and headaches.  Psychiatric/Behavioral: Negative for behavioral problems.    Allergies  Lisinopril  Home Medications   Current Outpatient Rx  Name Route Sig Dispense Refill  . ALBUTEROL SULFATE HFA 108 (90 BASE) MCG/ACT IN AERS Inhalation Inhale 2 puffs into the lungs every 4 (four) hours as needed. For shortness of breath 1 Inhaler 6  . AMITRIPTYLINE HCL 25 MG PO TABS Oral Take 1 tablet (25 mg total) by mouth at bedtime as needed for sleep. 30 tablet 1  . ASPIRIN 325 MG PO TABS Oral Take 325 mg by mouth daily.     Marland Kitchen CLOPIDOGREL BISULFATE 75 MG PO TABS Oral Take 1 tablet (75 mg total) by mouth daily. 30 tablet 6  . FERROUS SULFATE 325 (65 FE) MG PO TABS Oral Take 325 mg by mouth daily with breakfast.    . FLUTICASONE-SALMETEROL 100-50 MCG/DOSE IN AEPB Inhalation Inhale 1 puff into the lungs every 12 (twelve) hours. 60 each 6  . HYDROCODONE-ACETAMINOPHEN 5-325 MG PO TABS Oral Take 1 tablet by mouth every 4 (four) hours as needed for pain. 30 tablet 0  . IBUPROFEN 200 MG PO TABS Oral Take 400 mg by mouth every 6 (six) hours as needed. For pain    . INSULIN GLARGINE 100 UNIT/ML [place] SOLN Subcutaneous Inject 40 Units into the skin at bedtime. 10 mL   . LABETALOL HCL 100 MG PO TABS Oral Take 50 mg by mouth 2 (two) times daily.    Marland Kitchen OMEPRAZOLE 40 MG PO CPDR Oral Take 1 capsule (40 mg total) by mouth daily. 31 capsule 6  . PANTOPRAZOLE SODIUM 40 MG PO TBEC  TAKE 1 TABLET BY MOUTH EVERY DAY 10 tablet 0  . PROMETHAZINE HCL 25 MG PO TABS Oral Take 1 tablet (25 mg total) by mouth every 6 (six) hours as needed for nausea. 20 tablet 0    BP 176/110  Pulse 118  Temp 99.1 F (37.3 C) (Oral)  Resp 18  SpO2 100%  LMP 12/27/2011  Physical Exam  Nursing note and vitals reviewed. Constitutional: She is oriented to person, place, and time. She appears well-developed and well-nourished.   HENT:  Head: Normocephalic and atraumatic.  Eyes: EOM are normal. Pupils are equal, round, and reactive to light.  Neck: Normal range of motion. Neck supple.  Cardiovascular: Normal rate, regular rhythm and normal heart sounds.   No murmur heard. Pulmonary/Chest: Effort normal and breath sounds normal. No respiratory distress. She has no wheezes. She has no rales.  Abdominal: Soft. Bowel sounds are normal. She exhibits no distension. There is tenderness. There is no rebound and no guarding.       There is a Band-Aid  in the midline lower abdomen from an infection per the patient. Mild lower abdominal tenderness without rebound or guarding.  Musculoskeletal: Normal range of motion.  Neurological: She is alert and oriented to person, place, and time. No cranial nerve deficit.  Skin: Skin is warm and dry.  Psychiatric: She has a normal mood and affect. Her speech is normal.    ED Course  Procedures (including critical care time)  Labs Reviewed  CBC WITH DIFFERENTIAL - Abnormal; Notable for the following:    RBC 5.30 (*)     Hemoglobin 11.7 (*)     HCT 34.8 (*)     MCV 65.7 (*)     MCH 22.1 (*)     RDW 18.9 (*)     Platelets 405 (*)     Neutrophils Relative 84 (*)     Neutro Abs 8.8 (*)     All other components within normal limits  COMPREHENSIVE METABOLIC PANEL - Abnormal; Notable for the following:    Sodium 134 (*)     Glucose, Bld 255 (*)     BUN 5 (*)     Albumin 3.0 (*)     Total Bilirubin 0.2 (*)     All other components within normal limits  URINALYSIS, ROUTINE W REFLEX MICROSCOPIC - Abnormal; Notable for the following:    APPearance CLOUDY (*)     Glucose, UA >1000 (*)     Ketones, ur 40 (*)     Leukocytes, UA TRACE (*)     All other components within normal limits  URINE MICROSCOPIC-ADD ON - Abnormal; Notable for the following:    Squamous Epithelial / LPF MANY (*)     Bacteria, UA MANY (*)     All other components within normal limits  URINALYSIS, ROUTINE W  REFLEX MICROSCOPIC - Abnormal; Notable for the following:    APPearance CLOUDY (*)     Glucose, UA >1000 (*)     Ketones, ur 40 (*)     All other components within normal limits  URINE MICROSCOPIC-ADD ON - Abnormal; Notable for the following:    Squamous Epithelial / LPF MANY (*)     Bacteria, UA MANY (*)     All other components within normal limits  LIPASE, BLOOD  PREGNANCY, URINE  URINE CULTURE   No results found.   1. Nausea and vomiting   2. Chronic abdominal pain       MDM  Acute on chronic abdominal pain nausea and vomiting. She does have some dehydration. She has not tolerated orals here and has ketosis in her urine. She'll be admitted. Urinalysis also showed possible infection, however looks similar to previous. She was treated with Rocephin the ER cultures were sent.  Juliet Rude. Rubin Payor, MD 01/26/12 [phone]

## 2012-01-26 NOTE — ED Notes (Signed)
Pt to room via [facility]; c/o abd pain since 8pm; nausea and vomiting; pt reports abd pain since 10/3 8pm but has had same pain off & on x 1 year; pt has been referred to [facility] for follow up testing but has declined to go; EMS started 20g to left hand and administered 4 mg Zofran IV; pt reports no relief in symptoms from Zofran

## 2012-01-26 NOTE — ED Provider Notes (Signed)
Pt seen by Dr Rubin Payor and admitted to hospitalist service at [facility] earlier today. Actually Internal Medicine Teaching Service pt. Discussed with IM. Requesting transfer to ED at [facility] and will evaluate pt there and determine further disposition.   Raeford Razor, MD 01/26/12 [phone]

## 2012-01-26 NOTE — ED Notes (Signed)
Report given to [facility], they are on their way to transport pt to CDU. According to previous RN Barbara Cower gave report to CDU nurse already.

## 2012-01-26 NOTE — ED Notes (Signed)
Attempted to draw blood for labs. Pt falling asleep during blood draw sitting up, arm falling and unable to obtain labs. Pt asked if the meds given previously helped her pain, she nodded yes. Woke pt up more and pt repositioned self and asked about pain again and pt reports pain is 10/10 still and nausea is still present. However, pt sleeping continuously through blood draw and reassessment. Will have phlebotomy attempt to collect labs.

## 2012-01-26 NOTE — Progress Notes (Signed)
Called by Dr Rubin Payor for possible admission of Ms Tiye Huwe for persistent nausea, vomiting and abdominal pain. She is a patient of IM teaching service, spoke to Dr Loistine Chance (pager [phone]) , who will accept the patient at [facility]. Hence will transfer the patient to [facility] to be admitted by IM teaching service physicians. Spoke to ED physician at [facility] who will put the orders for transfer.   Kathlen Mody, MD [facility] [phone].

## 2012-01-26 NOTE — ED Provider Notes (Signed)
Patient transferred from [facility] ED for evaluation and disposition by the outpatient clinic.  Patient with acute on chronic abdominal pain. UTI, dehydration.  Not tolerating po's.  Patient has been evaluated by [facility] and treatment plan discussed with nursing staff.  [facility] will continue to monitor patient's progress with hopes of discharging home once tolerating po's. They will reassess in several hours to finalize disposition.  Jimmye Norman, NP 01/26/12 [phone]

## 2012-01-26 NOTE — ED Notes (Signed)
Pt reports 10/10 abd pain. C/o continued nausea. Sitting at end of bed, bent over. Does not make eye contact with RN. Only replies "uh-huh" to questions. Updated on delay and plan of care.

## 2012-01-26 NOTE — ED Notes (Signed)
IV infiltrated after phenergan administered IV. Pt did not complain of pain. Knot at IV site developed. IV site d/c'd.

## 2012-01-26 NOTE — ED Notes (Signed)
WGN:[id number]<HY> Expected date:01/26/12<BR> Expected time: 5:02 AM<BR> Means of arrival:Ambulance<BR> Comments:<BR> abd pain

## 2012-01-26 NOTE — ED Notes (Signed)
Attempted to collect blood specimens; pt shaking her arms and moving on stretcher; unable to collect samples; attempted to stick x 1 but pt moving around and dislodged needle

## 2012-01-26 NOTE — ED Notes (Signed)
[facility] at bedside 

## 2012-01-26 NOTE — ED Notes (Signed)
States pain has been ongoing since November 2012 and has gotten progressively worse

## 2012-01-26 NOTE — Consult Note (Signed)
Hospital Admission Note Date: 01/26/2012  Patient name: Shirley [name] Medical record number: [id number] Date of birth: [birth year] Age: 36 y.o. Gender: female PCP: Kristie Cowman, MD  Medical Service: [facility]  Attending physician: Dr. Lonzo Cloud 1st Contact: Dr. Elenor Legato  Pager: [phone] 2nd Contact: Dr. 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 She reports that her symptoms began last night and came on suddenly. She was sitting at home and became overwhelmed with nausea and vomited light brown emesis. She reports vomiting 5 times since last night. She also endorses stabbing, 10/10 abdominal pain around her umbilicus and LLQ. The pain does not radiate. She has had normal BM with no straining or blood in her stool. She denies diarrhea, fever, chills, headache, dysuria, hematochezia, melena, belching, cough, sour taste in mouth. Of note,  Shirley [name] has had 9 presentations over the past 6 months for similar complaints. She says that her pain today is similar to past episodes. She reports that she takes her reglan at home only when nauseated. She says she misses a couple doses of lantus each week.   Meds: Current Outpatient Rx  Name Route Sig Dispense Refill  . ALBUTEROL SULFATE HFA 108 (90 BASE) MCG/ACT IN AERS Inhalation Inhale 2 puffs into the lungs every 4 (four) hours as needed. For shortness of breath 1 Inhaler 6  . AMITRIPTYLINE HCL 25 MG PO TABS Oral Take 1 tablet (25 mg total) by mouth at bedtime as needed for sleep. 30 tablet 1  . ASPIRIN 325 MG PO TABS Oral Take 325 mg  by mouth daily.     Marland Kitchen CLOPIDOGREL BISULFATE 75 MG PO TABS Oral Take 1 tablet (75 mg total) by mouth daily. 30 tablet 6  . FERROUS SULFATE 325 (65 FE) MG PO TABS Oral Take 325 mg by mouth daily with breakfast.    . FLUTICASONE-SALMETEROL 100-50 MCG/DOSE IN AEPB Inhalation Inhale 1 puff into the lungs every 12 (twelve) hours. 60 each 6  . HYDROCODONE-ACETAMINOPHEN 5-325 MG PO TABS Oral Take 1 tablet by mouth every 4 (four) hours as needed for pain. 30 tablet 0  . IBUPROFEN 200 MG PO TABS Oral Take 400 mg by mouth every 6 (six) hours as needed. For pain    . INSULIN GLARGINE 100 UNIT/ML [place] SOLN Subcutaneous Inject 40 Units into the skin at bedtime. 10 mL   . LABETALOL HCL 100 MG PO TABS Oral Take 50 mg by mouth 2 (two) times daily.    Marland Kitchen OMEPRAZOLE 40 MG PO CPDR Oral Take 1 capsule (40 mg total) by mouth daily. 31 capsule 6  . PANTOPRAZOLE SODIUM 40 MG PO TBEC  TAKE 1 TABLET BY MOUTH EVERY DAY 10 tablet 0  . PROMETHAZINE HCL 25 MG PO TABS Oral Take 1 tablet (25 mg total) by mouth every 6 (six) hours as needed for nausea. 20 tablet 0    Allergies: Allergies as of 01/26/2012 - Review Complete 01/26/2012  Allergen Reaction Noted  . Lisinopril Nausea Only 03/17/2008   Past Medical History  Diagnosis Date  . Thrombocytosis  Hem/Onc suggested 2/2 chronic hepatits and/or iron deficiency anemia  . Iron deficiency anemia   . N&V (nausea and vomiting)     and  Abdominal pain, chronic. Unclear ethiology with multiple admission and ED visits. Work up including Abdominal xray showed mostly nonspecific bowel gas pattern, CT abdomen with and without contrast (02/2011)  showed no acute process. Gastic Emptying scan (01/2010) was normal. Ultrasound of the abdomen was within normal limits. Hepatitis B viral load was undectable. HIV nonreactive. EGD (11/09/  . Abdominal pain   . Hypertension   . Hyperlipidemia   . Diabetes mellitus   . Recurrent boils   . Back pain   . OSA (obstructive sleep apnea)   .  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 Cath 12/10- no restenosis or significant CAD progression  . Irregular menses     Small ovarian follicles seen on CT(9/06)  . History of pyelonephritis     H/o GrpB Pyelonephritis (9/06) and UTI- 07/11- E.Coli, 12/10- GBS  . Abscess of tunica vaginalis     10/09- Abundant S. aureus- sensitive to all abx  . Obesity   . Asthma   . Gastritis   . Depression   . Hepatitis B, chronic     Hep BeAb+,Hep B cAb+ & Hep BsAg+ (9/06)  . Peripheral neuropathy   . Fibromyalgia   . GERD (gastroesophageal reflux disease)   . Migraine   . Anxiety   . Urinary tract infection   . Gastroparesis     secondary to poorly controlled DM, last emptying study performed 01/2010  was normal but may be falsely positive as pt was on reglan  . MI (myocardial infarction) 06/15/2006  . Blood dyscrasia    Past Surgical History  Procedure Date  . Cesarean section 1997   Family History  Problem Relation Age of Onset  . Diabetes Father    History   Social History  . Marital Status: Single    Spouse Name: N/A    Number of Children: N/A  . Years of Education: N/A   Occupational History  . other     unemployed   Social History Main Topics  . Smoking status: Former Smoker    Types: Cigarettes    Quit date: 04/24/1996  . Smokeless tobacco: Never Used   Comment: quit smoking cigarettes age 69  . Alcohol Use: Yes     03/31/11 "used marijuana last ~ 2 wk ago; only drink on occasions"  . Drug Use: Yes    Special: Marijuana  . Sexually Active: Yes   Other Topics Concern  . 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    Review of Systems: 10 pt ROS negative except as per HPI  Physical Exam: Blood pressure  172/108, pulse 117, temperature 98.3 F (36.8 C), temperature source Oral, resp. rate 11, last menstrual period 12/27/2011, SpO2 100.00%. Vitals reviewed. General: Moaning and shaking her legs in bed. Leg shaking ceases upon distraction.  HEENT: PERRL, EOMI, no scleral icterus, MMM of OP, no erythema or exudate.  Cardiac: tachycardic to 110,  no rubs, murmurs or gallops Pulm: clear to auscultation bilaterally, no wheezes, rales, or rhonchi Abd: abdomen is soft w + bowel sounds. Voluntary guarding on palpation  of umbilical area which disappears upon distraction.   Ext: warm and well perfused, no pedal edema Neuro: alert and oriented X3, cranial nerves II-XII grossly intact, strength  equal in bilateral upper and lower extremities, sensation diminished in b/l toes and feet. Skin: Hyperpigmented plaques under b/l breasts at site of prior "boils"    Lab results: Basic Metabolic Panel:  [facility] 01/26/12 0940  NA 134*  K 3.9  CL 100  CO2 21  GLUCOSE 255*  BUN 5*  CREATININE 0.50  CALCIUM 9.0  MG --  PHOS --   Liver Function Tests:  Basename 01/26/12 0940  AST 11  ALT <5  ALKPHOS 69  BILITOT 0.2*  PROT 7.6  ALBUMIN 3.0*    Basename 01/26/12 0940  LIPASE 27  AMYLASE --   CBC:  Basename 01/26/12 0940  WBC 10.5  NEUTROABS 8.8*  HGB 11.7*  HCT 34.8*  MCV 65.7*  PLT 405*    Urinalysis:  Basename 01/26/12 1130 01/26/12 0639  COLORURINE YELLOW YELLOW  LABSPEC 1.028 1.027  PHURINE 6.5 7.0  GLUCOSEU >1000* >1000*  HGBUR NEGATIVE NEGATIVE  BILIRUBINUR NEGATIVE NEGATIVE  KETONESUR 40* 40*  PROTEINUR NEGATIVE NEGATIVE  UROBILINOGEN 1.0 1.0  NITRITE NEGATIVE NEGATIVE  LEUKOCYTESUR NEGATIVE TRACE*    Assessment & Plan by Problem:  1) Nausea and vomiting Shirley [name] is a 36 yo female with recurrent presentations for nausea and vomiting and history of poor compliance with reglan and insulin therapy. Her episode today is similar to multiple previous episodes. She does  not have any alarming symptoms, and abdominal pain is distractible on her exam. Lab workup notable for no significant metabolic derangements other than blood glucose in 250s on Bmet.  She has received extensive GI workup including 6 abdominal CTs and an abdominal ultrasound in the past 3 years which were unremarkable, a normal gastric emptying study in 2011, and EGD in 2011 notable for mild gastritis. Last Hep B viral load undetectable. She was treated for H. Pylori in 2011 diagnosed on gastric biopsy, but did not keep follow-up appts w GI to determine eradication. Per PCP notes, it appears that gastroparesis and noncompliance with insulin and reglan therapies most likely cause of her symptoms. There was also some concern by her PCP and other providers who have seen her that gastric emptying scan was false negative as she was taking reglan at the time of her scan.  Pt was given IV reglan 10mg  x1 and saline boluses in [facility] and [facility] ED. HR decreased to mid 90s after fluid boluses and pt sleeping on reevaluation. Says pain improved.Spoke w Dr. Hyacinth Meeker who agreed to discharge patient. Sent her home with prescriptions for Reglan qachs, Phenergan and Protonix. Will contact her for f/u appt in [facility] Monday or Tues.   2) T2DM, poorly controlled T2DM characterized by medication noncompliance and complicated by peripheral neuropathy and gastroparesis. Pt's last HbA1c >14 on 01/22/12.  Emphasized importance of compliance w home lantus 40U at night, as poor glycemic control likely responsible for her current presentation.  Signed: Bronson Curb 01/26/2012, 10:36 PM

## 2012-01-27 MED ORDER — OXYCODONE-ACETAMINOPHEN 5-325 MG PO TABS
1.0000 | ORAL_TABLET | Freq: Once | ORAL | Status: AC
  Administered 2012-01-27: 1 via ORAL
  Filled 2012-01-27: qty 1

## 2012-01-27 MED ORDER — METOCLOPRAMIDE HCL 10 MG PO TABS: 10.0000 mg | ORAL_TABLET | Freq: Three times a day (TID) | ORAL | Status: DC

## 2012-01-27 MED ORDER — PROMETHAZINE HCL 25 MG PO TABS: 25.0000 mg | ORAL_TABLET | Freq: Four times a day (QID) | ORAL | Status: DC | PRN

## 2012-01-27 MED ORDER — PANTOPRAZOLE SODIUM 40 MG PO TBEC: 40.0000 mg | DELAYED_RELEASE_TABLET | Freq: Every day | ORAL | Status: DC

## 2012-01-27 NOTE — ED Provider Notes (Signed)
  Physical Exam  BP 172/108  Pulse 117  Temp 98.3 F (36.8 C) (Oral)  Resp 11  SpO2 100%  LMP 12/27/2011  Physical Exam  ED Course  Procedures  MDM Patient has been seen and examined repeatedly by outpatient clinic resident, he wants to discharge the patient home, she has no abdominal pain, he will write for prescriptions and has asked me to quit discharge as his attending physician cannot see the patient this evening.      Vida Roller, MD 01/27/12 [phone]

## 2012-01-27 NOTE — ED Provider Notes (Signed)
Medical screening examination/treatment/procedure(s) were performed by non-physician practitioner and as supervising physician I was immediately available for consultation/collaboration.   Brookelyn Gaynor, MD 01/27/12 2317 

## 2012-01-27 NOTE — Consult Note (Signed)
36 year old Shirley [name] seen by Dr.Pribula overnight and discussed with me at 10 pm. Chart review done and patient has had an extensive GI work up and probable gastroparesis with repeated ER visits in the past on year with similar complaints. Stabilized last night and discharged. 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 She would benefit from an SSRI and psychiatric evaluation along with CBT which will be addressed in her out patient evaluation.

## 2012-01-28 ENCOUNTER — Encounter (HOSPITAL_COMMUNITY): Payer: Self-pay | Admitting: Emergency Medicine

## 2012-01-28 ENCOUNTER — Emergency Department (HOSPITAL_COMMUNITY)
Admission: EM | Admit: 2012-01-28 | Discharge: 2012-01-29 | Disposition: A | Discharge status: Home / Self Care | Payer: Medicaid Other | Attending: Emergency Medicine | Admitting: Emergency Medicine

## 2012-01-28 DIAGNOSIS — R112 Nausea with vomiting, unspecified: Secondary | ICD-10-CM | POA: Insufficient documentation

## 2012-01-28 DIAGNOSIS — E119 Type 2 diabetes mellitus without complications: Secondary | ICD-10-CM | POA: Insufficient documentation

## 2012-01-28 DIAGNOSIS — Z862 Personal history of diseases of the blood and blood-forming organs and certain disorders involving the immune mechanism: Secondary | ICD-10-CM | POA: Insufficient documentation

## 2012-01-28 DIAGNOSIS — K219 Gastro-esophageal reflux disease without esophagitis: Secondary | ICD-10-CM | POA: Insufficient documentation

## 2012-01-28 DIAGNOSIS — I252 Old myocardial infarction: Secondary | ICD-10-CM | POA: Insufficient documentation

## 2012-01-28 DIAGNOSIS — Z79899 Other long term (current) drug therapy: Secondary | ICD-10-CM | POA: Insufficient documentation

## 2012-01-28 DIAGNOSIS — G8929 Other chronic pain: Secondary | ICD-10-CM | POA: Insufficient documentation

## 2012-01-28 DIAGNOSIS — I2510 Atherosclerotic heart disease of native coronary artery without angina pectoris: Secondary | ICD-10-CM | POA: Insufficient documentation

## 2012-01-28 DIAGNOSIS — R109 Unspecified abdominal pain: Secondary | ICD-10-CM | POA: Insufficient documentation

## 2012-01-28 DIAGNOSIS — I10 Essential (primary) hypertension: Secondary | ICD-10-CM | POA: Insufficient documentation

## 2012-01-28 DIAGNOSIS — Z8639 Personal history of other endocrine, nutritional and metabolic disease: Secondary | ICD-10-CM | POA: Insufficient documentation

## 2012-01-28 DIAGNOSIS — Z794 Long term (current) use of insulin: Secondary | ICD-10-CM | POA: Insufficient documentation

## 2012-01-28 LAB — URINALYSIS, ROUTINE W REFLEX MICROSCOPIC
Ketones, ur: >80 mg/dL — AB
Leukocytes, UA: NEGATIVE
Nitrite: NEGATIVE
Specific Gravity, Urine: 1.031 — ABNORMAL HIGH (ref 1.005–1.030)
Urobilinogen, UA: 1 mg/dL (ref 0.0–1.0)
pH: 5.5 (ref 5.0–8.0)

## 2012-01-28 LAB — CBC WITH DIFFERENTIAL/PLATELET
Basophils Absolute: 0 10*3/uL (ref 0.0–0.1)
Eosinophils Relative: 0 % (ref 0–5)
Lymphs Abs: 2.3 10*3/uL (ref 0.7–4.0)
MCV: 66.5 fL — ABNORMAL LOW (ref 78.0–100.0)
Monocytes Absolute: 0.9 10*3/uL (ref 0.1–1.0)
Monocytes Relative: 7 % (ref 3–12)
Neutrophils Relative %: 74 % (ref 43–77)
Platelets: 451 10*3/uL — ABNORMAL HIGH (ref 150–400)
RBC: 5.5 MIL/uL — ABNORMAL HIGH (ref 3.87–5.11)
RDW: 19.1 % — ABNORMAL HIGH (ref 11.5–15.5)
WBC: 12.3 10*3/uL — ABNORMAL HIGH (ref 4.0–10.5)

## 2012-01-28 LAB — BASIC METABOLIC PANEL
BUN: 11 mg/dL (ref 6–23)
Chloride: 95 mEq/L — ABNORMAL LOW (ref 96–112)
Glucose, Bld: 142 mg/dL — ABNORMAL HIGH (ref 70–99)
Potassium: 3.7 mEq/L (ref 3.5–5.1)

## 2012-01-28 LAB — URINE CULTURE

## 2012-01-28 MED ORDER — SODIUM CHLORIDE 0.9 % IV BOLUS (SEPSIS)
1000.0000 mL | Freq: Once | INTRAVENOUS | Status: AC
  Administered 2012-01-28: 1000 mL via INTRAVENOUS

## 2012-01-28 MED ORDER — ONDANSETRON HCL 4 MG/2ML IJ SOLN
4.0000 mg | Freq: Once | INTRAMUSCULAR | Status: AC
  Administered 2012-01-28: 4 mg via INTRAVENOUS
  Filled 2012-01-28: qty 2

## 2012-01-28 MED ORDER — OXYCODONE-ACETAMINOPHEN 5-325 MG PO TABS
1.0000 | ORAL_TABLET | Freq: Once | ORAL | Status: AC
  Administered 2012-01-28: 1 via ORAL
  Filled 2012-01-28: qty 1

## 2012-01-28 NOTE — ED Notes (Signed)
Pt c/o n/v abd pain x 2 days 

## 2012-01-28 NOTE — ED Notes (Signed)
Shari, PA at bedside. 

## 2012-01-28 NOTE — ED Notes (Signed)
To ED via [facility], Medic 31-- LLQ pain, was seen at [facility] 2 days ago for same, states vomited 1 hour ago, no diarrhea.

## 2012-01-28 NOTE — ED Notes (Signed)
Pt requesting pain medications.  Melvenia Beam, PA made aware.

## 2012-01-28 NOTE — ED Provider Notes (Signed)
History     CSN: [id number]  Arrival date & time 01/28/12  1845   First MD Initiated Contact with Patient 01/28/12 2007      Chief Complaint  Patient presents with  . Abdominal Pain    (Consider location/radiation/quality/duration/timing/severity/associated sxs/prior treatment) Patient is a 36 y.o. female presenting with abdominal pain. The history is provided by the patient.  Abdominal Pain The primary symptoms of the illness include abdominal pain, nausea and vomiting. The primary symptoms of the illness do not include fever, shortness of breath, diarrhea or dysuria. The current episode started yesterday.  The abdominal pain is generalized.  Associated symptoms comments: Patient with a long history of abdominal pain, N, V c/w gastroparesis but with tests that are inconclusive for definitive diagnosis. She reports recurrent symptoms yesterday through to today, with 3-6 vomiting episodes per day and no PO intake except small amounts of water. No fever. No diarrhea. .    Past Medical History  Diagnosis Date  . Thrombocytosis     Hem/Onc suggested 2/2 chronic hepatits and/or iron deficiency anemia  . Iron deficiency anemia   . N&V (nausea and vomiting)     and  Abdominal pain, chronic. Unclear ethiology with multiple admission and ED visits. Work up including Abdominal xray showed mostly nonspecific bowel gas pattern, CT abdomen with and without contrast (02/2011)  showed no acute process. Gastic Emptying scan (01/2010) was normal. Ultrasound of the abdomen was within normal limits. Hepatitis B viral load was undectable. HIV nonreactive. EGD (11/09/  . Abdominal pain   . Hypertension   . Hyperlipidemia   . Diabetes mellitus   . Recurrent boils   . Back pain   . OSA (obstructive sleep apnea)   . 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 Cath 12/10- no restenosis or significant  CAD progression  . Irregular menses     Small ovarian follicles seen on CT(9/06)  . History of pyelonephritis     H/o GrpB Pyelonephritis (9/06) and UTI- 07/11- E.Coli, 12/10- GBS  . Abscess of tunica vaginalis     10/09- Abundant S. aureus- sensitive to all abx  . Obesity   . Asthma   . Gastritis   . Depression   . Hepatitis B, chronic     Hep BeAb+,Hep B cAb+ & Hep BsAg+ (9/06)  . Peripheral neuropathy   . Fibromyalgia   . GERD (gastroesophageal reflux disease)   . Migraine   . Anxiety   . Urinary tract infection   . Gastroparesis     secondary to poorly controlled DM, last emptying study performed 01/2010  was normal but may be falsely positive as pt was on reglan  . MI (myocardial infarction) 06/15/2006  . Blood dyscrasia     Past Surgical History  Procedure Date  . Cesarean section 1997    Family History  Problem Relation Age of Onset  . Diabetes Father     History  Substance Use Topics  . Smoking status: Former Smoker    Types: Cigarettes    Quit date: 04/24/1996  . Smokeless tobacco: Never Used   Comment: quit smoking cigarettes age 89  . Alcohol Use: Yes     03/31/11 "used marijuana last ~ 2 wk ago; only drink on occasions"    OB History    Grav Para Term Preterm Abortions TAB SAB Ect Mult Living  Review of Systems  Constitutional: Negative for fever.  Respiratory: Negative for shortness of breath.   Cardiovascular: Negative for chest pain.  Gastrointestinal: Positive for nausea, vomiting and abdominal pain. Negative for diarrhea.  Genitourinary: Negative for dysuria.  Musculoskeletal: Negative.     Allergies  Lisinopril  Home Medications   Current Outpatient Rx  Name Route Sig Dispense Refill  . ALBUTEROL SULFATE HFA 108 (90 BASE) MCG/ACT IN AERS Inhalation Inhale 2 puffs into the lungs every 4 (four) hours as needed. For shortness of breath 1 Inhaler 6  . AMITRIPTYLINE HCL 25 MG PO TABS Oral Take 1 tablet (25 mg total) by  mouth at bedtime as needed for sleep. 30 tablet 1  . ASPIRIN 325 MG PO TABS Oral Take 325 mg by mouth daily.     Marland Kitchen CLOPIDOGREL BISULFATE 75 MG PO TABS Oral Take 1 tablet (75 mg total) by mouth daily. 30 tablet 6  . FERROUS SULFATE 325 (65 FE) MG PO TABS Oral Take 325 mg by mouth daily with breakfast.    . FLUTICASONE-SALMETEROL 100-50 MCG/DOSE IN AEPB Inhalation Inhale 1 puff into the lungs every 12 (twelve) hours. 60 each 6  . HYDROCODONE-ACETAMINOPHEN 5-325 MG PO TABS Oral Take 1 tablet by mouth every 4 (four) hours as needed for pain. 30 tablet 0  . IBUPROFEN 200 MG PO TABS Oral Take 400 mg by mouth every 6 (six) hours as needed. For pain    . INSULIN GLARGINE 100 UNIT/ML [place] SOLN Subcutaneous Inject 40 Units into the skin at bedtime. 10 mL   . LABETALOL HCL 100 MG PO TABS Oral Take 50 mg by mouth 2 (two) times daily.    Marland Kitchen METOCLOPRAMIDE HCL 10 MG PO TABS Oral Take 1 tablet (10 mg total) by mouth 4 (four) times daily - after meals and at bedtime. 120 tablet 0  . OMEPRAZOLE 40 MG PO CPDR Oral Take 1 capsule (40 mg total) by mouth daily. 31 capsule 6  . PANTOPRAZOLE SODIUM 40 MG PO TBEC Oral Take 1 tablet (40 mg total) by mouth daily. 30 tablet 0  . PROMETHAZINE HCL 25 MG PO TABS Oral Take 1 tablet (25 mg total) by mouth every 6 (six) hours as needed for nausea. 20 tablet 0    BP 163/112  Pulse 119  Temp 98.7 F (37.1 C) (Oral)  Resp 18  Ht 5\' 7"  (1.702 m)  Wt 200 lb (90.719 kg)  BMI 31.32 kg/m2  SpO2 98%  LMP 01/09/2012  Physical Exam  Constitutional: She is oriented to person, place, and time. She appears well-developed and well-nourished.  HENT:  Head: Normocephalic.  Mouth/Throat: Mucous membranes are dry.  Neck: Normal range of motion. Neck supple.  Cardiovascular: Normal rate and regular rhythm.   Pulmonary/Chest: Effort normal and breath sounds normal.  Abdominal: Soft. Bowel sounds are normal. There is no rebound and no guarding.       Diffuse abdominal tenderness.     Musculoskeletal: Normal range of motion.  Neurological: She is alert and oriented to person, place, and time.  Skin: Skin is warm and dry. No rash noted.  Psychiatric: She has a normal mood and affect.    ED Course  Procedures (including critical care time)   Labs Reviewed  BASIC METABOLIC PANEL  CBC WITH DIFFERENTIAL  URINALYSIS, ROUTINE W REFLEX MICROSCOPIC   Results for orders placed during the hospital encounter of 01/28/12  BASIC METABOLIC PANEL      Component Value Range   Sodium 132 (*)  135 - 145 mEq/L   Potassium 3.7  3.5 - 5.1 mEq/L   Chloride 95 (*) 96 - 112 mEq/L   CO2 21  19 - 32 mEq/L   Glucose, Bld 142 (*) 70 - 99 mg/dL   BUN 11  6 - 23 mg/dL   Creatinine, Ser 0.45  0.50 - 1.10 mg/dL   Calcium 9.5  8.4 - 40.9 mg/dL   GFR calc non Af Amer >90  >90 mL/min   GFR calc Af Amer >90  >90 mL/min  CBC WITH DIFFERENTIAL      Component Value Range   WBC 12.3 (*) 4.0 - 10.5 K/uL   RBC 5.50 (*) 3.87 - 5.11 MIL/uL   Hemoglobin 12.5  12.0 - 15.0 g/dL   HCT 81.1  91.4 - 78.2 %   MCV 66.5 (*) 78.0 - 100.0 fL   MCH 22.7 (*) 26.0 - 34.0 pg   MCHC 34.2  30.0 - 36.0 g/dL   RDW 95.6 (*) 21.3 - 08.6 %   Platelets 451 (*) 150 - 400 K/uL   Neutrophils Relative 74  43 - 77 %   Lymphocytes Relative 19  12 - 46 %   Monocytes Relative 7  3 - 12 %   Eosinophils Relative 0  0 - 5 %   Basophils Relative 0  0 - 1 %   Neutro Abs 9.1 (*) 1.7 - 7.7 K/uL   Lymphs Abs 2.3  0.7 - 4.0 K/uL   Monocytes Absolute 0.9  0.1 - 1.0 K/uL   Eosinophils Absolute 0.0  0.0 - 0.7 K/uL   Basophils Absolute 0.0  0.0 - 0.1 K/uL   RBC Morphology TARGET CELLS    URINALYSIS, ROUTINE W REFLEX MICROSCOPIC      Component Value Range   Color, Urine RED (*) YELLOW   APPearance TURBID (*) CLEAR   Specific Gravity, Urine 1.031 (*) 1.005 - 1.030   pH 5.5  5.0 - 8.0   Glucose, UA 250 (*) NEGATIVE mg/dL   Hgb urine dipstick LARGE (*) NEGATIVE   Bilirubin Urine MODERATE (*) NEGATIVE   Ketones, ur >80 (*)  NEGATIVE mg/dL   Protein, ur 578 (*) NEGATIVE mg/dL   Urobilinogen, UA 1.0  0.0 - 1.0 mg/dL   Nitrite NEGATIVE  NEGATIVE   Leukocytes, UA NEGATIVE  NEGATIVE  URINE MICROSCOPIC-ADD ON      Component Value Range   Squamous Epithelial / LPF RARE  RARE   WBC, UA 3-6  <3 WBC/hpf   RBC / HPF TOO NUMEROUS TO COUNT  <3 RBC/hpf   Bacteria, UA RARE  RARE   Urine-Other URINALYSIS PERFORMED ON SUPERNATANT      No results found.   No diagnosis found.  1. Abdominal pain, acute on chronic 2. Gastroparesis, history  MDM  No vomiting in ED. Patient hydrated, given antiemetics for symptom control and is tolerating PO fluids.  Labs unremarkable. Patient with a long history of similar symptoms and GI follow up in the community with Dr. Bosie Clos. Stable for discahrge.         Rodena Medin, PA-C 01/29/12 [phone]

## 2012-01-29 MED ORDER — PROMETHAZINE HCL 25 MG/ML IJ SOLN
12.5000 mg | Freq: Once | INTRAMUSCULAR | Status: AC
  Administered 2012-01-29: 12.5 mg via INTRAVENOUS
  Filled 2012-01-29: qty 1

## 2012-01-29 MED ORDER — HYDROCODONE-ACETAMINOPHEN 5-325 MG PO TABS
1.0000 | ORAL_TABLET | Freq: Once | ORAL | Status: AC
  Administered 2012-01-29: 1 via ORAL
  Filled 2012-01-29: qty 1

## 2012-01-29 MED ORDER — PROMETHAZINE HCL 25 MG RE SUPP: 25.0000 mg | Freq: Four times a day (QID) | RECTAL | Status: DC | PRN

## 2012-01-29 NOTE — ED Provider Notes (Signed)
History/physical exam/procedure(s) were performed by non-physician practitioner and as supervising physician I was immediately available for consultation/collaboration. I have reviewed all notes and am in agreement with care and plan.   Hilario Quarry, MD 01/29/12 [phone]

## 2012-01-30 ENCOUNTER — Telehealth: Payer: Self-pay | Admitting: *Deleted

## 2012-01-30 NOTE — Telephone Encounter (Signed)
Call to [facility] to reschedule patients appointment.  Pt has been rescheduled to 02/05/2012 at 3:00 PM with Dr. Elnoria Howard.  Pt has an appointment in the Clinics on 02/01/2012 to be informed of visit then.  Angelina Ok, RN 01/30/2012 8:56 AM

## 2012-02-01 ENCOUNTER — Ambulatory Visit: Payer: Medicaid Other | Admitting: Internal Medicine

## 2012-02-08 ENCOUNTER — Encounter: Payer: Self-pay | Admitting: Radiation Oncology

## 2012-02-08 ENCOUNTER — Telehealth: Payer: Self-pay | Admitting: *Deleted

## 2012-02-08 ENCOUNTER — Ambulatory Visit (INDEPENDENT_AMBULATORY_CARE_PROVIDER_SITE_OTHER): Payer: Medicaid Other | Admitting: Radiation Oncology

## 2012-02-08 VITALS — BP 141/95 | HR 74 | Temp 98.0°F | Ht 68.0 in | Wt 213.0 lb

## 2012-02-08 DIAGNOSIS — R1032 Left lower quadrant pain: Secondary | ICD-10-CM

## 2012-02-08 DIAGNOSIS — R51 Headache: Secondary | ICD-10-CM

## 2012-02-08 DIAGNOSIS — R519 Headache, unspecified: Secondary | ICD-10-CM | POA: Insufficient documentation

## 2012-02-08 DIAGNOSIS — F341 Dysthymic disorder: Secondary | ICD-10-CM

## 2012-02-08 LAB — GLUCOSE, CAPILLARY: Glucose-Capillary: 311 mg/dL — ABNORMAL HIGH (ref 70–99)

## 2012-02-08 MED ORDER — DICLOFENAC SODIUM 75 MG PO TBEC: 75.0000 mg | DELAYED_RELEASE_TABLET | Freq: Two times a day (BID) | ORAL | Status: DC

## 2012-02-08 MED ORDER — AMITRIPTYLINE HCL 25 MG PO TABS: 50.0000 mg | ORAL_TABLET | Freq: Every day | ORAL | Status: DC
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
## 2012-02-08 NOTE — Assessment & Plan Note (Signed)
Complaints of L-sided abdominal pain are chronic for this patient. She has had extensive evaluation for this issue, with no concrete etiology identified. Today patient has only minimal TTP of LUQ and LLQ. She is having regular bowel movements.  Her DM is poorly controlled, and some thought has been given to possible gastroparesis, however a previous NM gastric emptying study in 01/2010 was within normal limits (and patient states she had not been taking reglan at time of study). At time of today's visit, patient states she is taking her reglan and phenergan regularly, which may account for why she does not have N/V accompanying her complaints of abdominal pain today. She is scheduled for upper/lower endoscopy on 10/22. Patient is requesting percocet for her abdominal pain, however this would likely worsen any diabetic gastroparesis that the patient might have (a diagnosis which is still being entertained).  - upper/lower endoscopy per Dr. Elnoria Howard on 10/22 - cont reglan/phenergan - cont PPI

## 2012-02-08 NOTE — Telephone Encounter (Signed)
Pt calls and states she has not slept due to mid L abd pain since last night at [place]. States pain is constant, stabiing, burning 8/10, states she is not able to drink po fluids but urine is med yellow in color and she continues to void. States she has appt for egd and colonoscopy 10/22 dr hung. appt today dr Lavena Bullion at 1430. Denies fevers, N&V. appt per [place].

## 2012-02-11 ENCOUNTER — Encounter (HOSPITAL_COMMUNITY): Payer: Self-pay

## 2012-02-11 ENCOUNTER — Observation Stay (HOSPITAL_COMMUNITY)
Admission: EM | Admit: 2012-02-11 | Discharge: 2012-02-11 | Disposition: A | Discharge status: Home / Self Care | Payer: Medicaid Other | Attending: Emergency Medicine | Admitting: Emergency Medicine

## 2012-02-11 DIAGNOSIS — IMO0001 Reserved for inherently not codable concepts without codable children: Secondary | ICD-10-CM | POA: Insufficient documentation

## 2012-02-11 DIAGNOSIS — Z9861 Coronary angioplasty status: Secondary | ICD-10-CM | POA: Insufficient documentation

## 2012-02-11 DIAGNOSIS — K219 Gastro-esophageal reflux disease without esophagitis: Secondary | ICD-10-CM | POA: Insufficient documentation

## 2012-02-11 DIAGNOSIS — I252 Old myocardial infarction: Secondary | ICD-10-CM | POA: Insufficient documentation

## 2012-02-11 DIAGNOSIS — E860 Dehydration: Secondary | ICD-10-CM

## 2012-02-11 DIAGNOSIS — R1032 Left lower quadrant pain: Secondary | ICD-10-CM | POA: Insufficient documentation

## 2012-02-11 DIAGNOSIS — I10 Essential (primary) hypertension: Secondary | ICD-10-CM | POA: Insufficient documentation

## 2012-02-11 DIAGNOSIS — R112 Nausea with vomiting, unspecified: Principal | ICD-10-CM | POA: Insufficient documentation

## 2012-02-11 DIAGNOSIS — G8929 Other chronic pain: Secondary | ICD-10-CM | POA: Insufficient documentation

## 2012-02-11 DIAGNOSIS — K3184 Gastroparesis: Secondary | ICD-10-CM

## 2012-02-11 DIAGNOSIS — E1142 Type 2 diabetes mellitus with diabetic polyneuropathy: Secondary | ICD-10-CM | POA: Insufficient documentation

## 2012-02-11 DIAGNOSIS — E1149 Type 2 diabetes mellitus with other diabetic neurological complication: Secondary | ICD-10-CM | POA: Insufficient documentation

## 2012-02-11 DIAGNOSIS — D509 Iron deficiency anemia, unspecified: Secondary | ICD-10-CM | POA: Insufficient documentation

## 2012-02-11 LAB — CBC WITH DIFFERENTIAL/PLATELET
Basophils Absolute: 0 10*3/uL (ref 0.0–0.1)
Basophils Relative: 0 % (ref 0–1)
Eosinophils Relative: 1 % (ref 0–5)
HCT: 36.1 % (ref 36.0–46.0)
Hemoglobin: 12.2 g/dL (ref 12.0–15.0)
Lymphocytes Relative: 16 % (ref 12–46)
Lymphs Abs: 2.2 10*3/uL (ref 0.7–4.0)
MCV: 66.9 fL — ABNORMAL LOW (ref 78.0–100.0)
Monocytes Relative: 5 % (ref 3–12)
Neutro Abs: 10.5 10*3/uL — ABNORMAL HIGH (ref 1.7–7.7)
RBC: 5.4 MIL/uL — ABNORMAL HIGH (ref 3.87–5.11)
WBC: 13.5 10*3/uL — ABNORMAL HIGH (ref 4.0–10.5)

## 2012-02-11 LAB — URINALYSIS, MICROSCOPIC ONLY
Glucose, UA: >1000 mg/dL — AB
Ketones, ur: 40 mg/dL — AB
Leukocytes, UA: NEGATIVE
Protein, ur: 30 mg/dL — AB
Urobilinogen, UA: 1 mg/dL (ref 0.0–1.0)

## 2012-02-11 LAB — COMPREHENSIVE METABOLIC PANEL
Albumin: 3.5 g/dL (ref 3.5–5.2)
Alkaline Phosphatase: 73 U/L (ref 39–117)
BUN: 6 mg/dL (ref 6–23)
CO2: 23 mEq/L (ref 19–32)
Chloride: 100 mEq/L (ref 96–112)
GFR calc non Af Amer: >90 mL/min (ref >90)
Glucose, Bld: 296 mg/dL — ABNORMAL HIGH (ref 70–99)
Potassium: 4 mEq/L (ref 3.5–5.1)
Total Bilirubin: 0.3 mg/dL (ref 0.3–1.2)

## 2012-02-11 LAB — POCT PREGNANCY, URINE: Preg Test, Ur: NEGATIVE

## 2012-02-11 LAB — GLUCOSE, CAPILLARY: Glucose-Capillary: 291 mg/dL — ABNORMAL HIGH (ref 70–99)

## 2012-02-11 MED ORDER — HYDROMORPHONE HCL PF 1 MG/ML IJ SOLN
1.0000 mg | Freq: Once | INTRAMUSCULAR | Status: AC
  Administered 2012-02-11: 1 mg via INTRAVENOUS
  Filled 2012-02-11: qty 1

## 2012-02-11 MED ORDER — SODIUM CHLORIDE 0.9 % IV BOLUS (SEPSIS)
1000.0000 mL | Freq: Once | INTRAVENOUS | Status: AC
  Administered 2012-02-11: 1000 mL via INTRAVENOUS

## 2012-02-11 MED ORDER — PROMETHAZINE HCL 25 MG/ML IJ SOLN
25.0000 mg | Freq: Once | INTRAMUSCULAR | Status: AC
  Administered 2012-02-11: 25 mg via INTRAVENOUS
  Filled 2012-02-11: qty 1

## 2012-02-11 MED ORDER — SODIUM CHLORIDE 0.9 % IV SOLN
1000.0000 mL | Freq: Once | INTRAVENOUS | Status: AC
  Administered 2012-02-11: 1000 mL via INTRAVENOUS

## 2012-02-11 MED ORDER — METOCLOPRAMIDE HCL 5 MG/ML IJ SOLN
10.0000 mg | Freq: Four times a day (QID) | INTRAMUSCULAR | Status: DC
  Administered 2012-02-11: 10 mg via INTRAVENOUS
  Filled 2012-02-11: qty 2

## 2012-02-11 MED ORDER — METOCLOPRAMIDE HCL 5 MG/ML IJ SOLN
10.0000 mg | Freq: Once | INTRAMUSCULAR | Status: AC
  Administered 2012-02-11: 10 mg via INTRAVENOUS
  Filled 2012-02-11: qty 2

## 2012-02-11 MED ORDER — HYDROMORPHONE HCL PF 1 MG/ML IJ SOLN
1.0000 mg | INTRAMUSCULAR | Status: DC | PRN
  Administered 2012-02-11: 1 mg via INTRAVENOUS
  Filled 2012-02-11: qty 1

## 2012-02-11 MED ORDER — ONDANSETRON 4 MG PO TBDP
8.0000 mg | ORAL_TABLET | Freq: Once | ORAL | Status: AC
  Administered 2012-02-11: 8 mg via ORAL
  Filled 2012-02-11: qty 2

## 2012-02-11 MED ORDER — ONDANSETRON HCL 4 MG/2ML IJ SOLN
4.0000 mg | Freq: Once | INTRAMUSCULAR | Status: AC
  Administered 2012-02-11: 4 mg via INTRAVENOUS
  Filled 2012-02-11: qty 2

## 2012-02-11 MED ORDER — SODIUM CHLORIDE 0.9 % IV SOLN
1000.0000 mL | INTRAVENOUS | Status: DC
  Administered 2012-02-11: 1000 mL via INTRAVENOUS

## 2012-02-11 NOTE — ED Notes (Addendum)
Pt reports lower abd pain and N/V starting 1800. Pt in triage shaking her arms and legs, vomited clear sputum x1 in triage floor

## 2012-02-11 NOTE — ED Notes (Signed)
Patient given phone to call family member/friend.  Will continue to monitor.

## 2012-02-11 NOTE — ED Notes (Signed)
Pt resting in wheel chair when this RN returned to triage room, when this RN spoke to pt she began shaking all her extremities

## 2012-02-11 NOTE — ED Provider Notes (Signed)
History     CSN: [id number]  Arrival date & time 02/11/12  0004   First MD Initiated Contact with Patient 02/11/12 0124      Chief Complaint  Patient presents with  . Emesis  . Nausea  . Abdominal Pain    (Consider location/radiation/quality/duration/timing/severity/associated sxs/prior treatment) HPI History provided by pt and prior chart.  Pt presents w/ c/o constant, severe, sharp LLQ pain w/ associated N/V since 5pm yesterday.  Denies fever, diarrhea, hematemesis/hematochezia/melena and GU sx.  Most recent BM yesterday am.  Past abd surgeries include c-section.  Per prior chart, pt is seen for this same compliant often.  Per PCP note from 02/08/12, these sx are chronic x >64yrs.  The only thing that alleviates her pain is percocet.  She has f/u with Dr. Elnoria Howard on 10/22 for upper and lower endoscopy.  She is currently prescribed reglan and phenergan for possible gastroparesis secondary to poorly controlled diabetes.  Has had two CTs of abd/pelvis in the past 2.5 months, both neg for acute pathology.  Pt reports that current pain is typical.  She has been out of her insulin for the past 2-3 weeks.  Has been taking her phenergan and reglan w/out relief.   Past Medical History  Diagnosis Date  . Thrombocytosis     Hem/Onc suggested 2/2 chronic hepatits and/or iron deficiency anemia  . Iron deficiency anemia   . N&V (nausea and vomiting)     and  Abdominal pain, chronic. Unclear ethiology with multiple admission and ED visits. Work up including Abdominal xray showed mostly nonspecific bowel gas pattern, CT abdomen with and without contrast (02/2011)  showed no acute process. Gastic Emptying scan (01/2010) was normal. Ultrasound of the abdomen was within normal limits. Hepatitis B viral load was undectable. HIV nonreactive. EGD (11/09/  . Abdominal pain   . Hypertension   . Hyperlipidemia   . Diabetes mellitus   . Recurrent boils   . Back pain   . OSA (obstructive sleep apnea)   . 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 Cath 12/10- no restenosis or significant CAD progression  . Irregular menses     Small ovarian follicles seen on CT(9/06)  . History of pyelonephritis     H/o GrpB Pyelonephritis (9/06) and UTI- 07/11- E.Coli, 12/10- GBS  . Abscess of tunica vaginalis     10/09- Abundant S. aureus- sensitive to all abx  . Obesity   . Asthma   . Gastritis   . Depression   . Hepatitis B, chronic     Hep BeAb+,Hep B cAb+ & Hep BsAg+ (9/06)  . Peripheral neuropathy   . Fibromyalgia   . GERD (gastroesophageal reflux disease)   . Migraine   . Anxiety   . Urinary tract infection   . Gastroparesis     secondary to poorly controlled DM, last emptying study performed 01/2010  was normal but may be falsely positive as pt was on reglan  . MI (myocardial infarction) 06/15/2006  . Blood dyscrasia     Past Surgical History  Procedure Date  . Cesarean section 1997    Family History  Problem Relation Age of Onset  . Diabetes Father     History  Substance Use Topics  . Smoking status: Former Smoker    Types: Cigarettes    Quit date: 04/24/1996  . Smokeless tobacco: Never Used   Comment:  quit smoking cigarettes age 53  . Alcohol Use: Yes     03/31/11 "used marijuana last ~ 2 wk ago; only drink on occasions"    OB History    Grav Para Term Preterm Abortions TAB SAB Ect Mult Living                  Review of Systems  All other systems reviewed and are negative.    Allergies  Lisinopril  Home Medications   Current Outpatient Rx  Name Route Sig Dispense Refill  . ALBUTEROL SULFATE HFA 108 (90 BASE) MCG/ACT IN AERS Inhalation Inhale 2 puffs into the lungs every 4 (four) hours as needed. For shortness of breath 1 Inhaler 6  . AMITRIPTYLINE HCL 25 MG PO TABS Oral Take 2 tablets (50 mg total) by mouth at bedtime. 60 tablet 1  . ASPIRIN 325 MG PO TABS Oral  Take 325 mg by mouth daily.     Marland Kitchen CLOPIDOGREL BISULFATE 75 MG PO TABS Oral Take 1 tablet (75 mg total) by mouth daily. 30 tablet 6  . DICLOFENAC SODIUM 75 MG PO TBEC Oral Take 1 tablet (75 mg total) by mouth 2 (two) times daily. 60 tablet 1  . FERROUS SULFATE 325 (65 FE) MG PO TABS Oral Take 325 mg by mouth daily with breakfast.    . FLUTICASONE-SALMETEROL 100-50 MCG/DOSE IN AEPB Inhalation Inhale 1 puff into the lungs every 12 (twelve) hours. 60 each 6  . IBUPROFEN 200 MG PO TABS Oral Take 400 mg by mouth every 6 (six) hours as needed. For pain    . INSULIN GLARGINE 100 UNIT/ML [place] SOLN Subcutaneous Inject 40 Units into the skin at bedtime. 10 mL   . LABETALOL HCL 100 MG PO TABS Oral Take 50 mg by mouth 2 (two) times daily.    Marland Kitchen METOCLOPRAMIDE HCL 10 MG PO TABS Oral Take 1 tablet (10 mg total) by mouth 4 (four) times daily - after meals and at bedtime. 120 tablet 0  . OMEPRAZOLE 40 MG PO CPDR Oral Take 1 capsule (40 mg total) by mouth daily. 31 capsule 6  . PANTOPRAZOLE SODIUM 40 MG PO TBEC Oral Take 1 tablet (40 mg total) by mouth daily. 30 tablet 0  . PROMETHAZINE HCL 25 MG RE SUPP Rectal Place 1 suppository (25 mg total) rectally every 6 (six) hours as needed for nausea. 12 each 0  . PROMETHAZINE HCL 25 MG PO TABS Oral Take 1 tablet (25 mg total) by mouth every 6 (six) hours as needed for nausea. 20 tablet 0    BP 183/111  Pulse 117  Temp 98.6 F (37 C) (Oral)  Resp 18  SpO2 99%  LMP 01/28/2012  Physical Exam  Nursing note and vitals reviewed. Constitutional: She is oriented to person, place, and time. She appears well-developed and well-nourished. No distress.       Uncomfortable appearing  HENT:  Head: Normocephalic and atraumatic.       Dry mucous membranes  Eyes:       Normal appearance  Neck: Normal range of motion.  Cardiovascular: Normal rate and regular rhythm.   Pulmonary/Chest: Effort normal and breath sounds normal. No respiratory distress.  Abdominal: Soft. She  exhibits no distension and no mass. There is no rebound and no guarding.       Hypoactive bowel sounds.  Diffuse tenderness but worst in LLQ  Genitourinary:       No CVA tenderness  Musculoskeletal: Normal range of motion.  Neurological:  She is alert and oriented to person, place, and time.  Skin: Skin is warm and dry. No rash noted.  Psychiatric: She has a normal mood and affect. Her behavior is normal.    ED Course  Procedures (including critical care time)  Labs Reviewed  CBC WITH DIFFERENTIAL - Abnormal; Notable for the following:    WBC 13.5 (*)     RBC 5.40 (*)     MCV 66.9 (*)     MCH 22.6 (*)     RDW 18.8 (*)     Platelets 485 (*)     All other components within normal limits  COMPREHENSIVE METABOLIC PANEL  URINALYSIS, MICROSCOPIC ONLY   No results found.   No diagnosis found.    MDM  36yo F presents w/ acute on chronic LLQ pain and N/V.  Sx have been thought to be d/t diabetic gastroparesis but has f/u with GI scheduled for this week.  On exam, afebrile, dehydrated, abd soft, hypo-active bowel sounds, diffuse tenderness but worst in LLQ.  Labs unremarkable.  Pt has received zofran and phenergan w/ some relief of nausea and pain has improved from 10 to 7/10 after two doses of dilaudid.  Will move to CDU on dehydration protocol.          Otilio Miu, [place] 02/11/12 [phone]

## 2012-02-11 NOTE — ED Provider Notes (Signed)
12:54 PM  Pt was moved to CDU on dehydration protocol and Dr. Oletta Lamas resumed care. I have personally re-evaluated pt and they appear stable for dc. BP 131/81  Pulse 86  Temp 98.6 F (37 C) (Oral)  Resp 18  SpO2 100%  LMP 01/28/2012 At this time there does not appear to be any evidence of an acute emergency medical condition and the patient appears stable for discharge with appropriate outpatient follow up.Diagnosis was discussed with patient who verbalizes understanding and is agreeable to discharge. Pt case discussed with Dr. Oletta Lamas who agrees with my plan.    Jaci Carrel, [place] 02/11/12 1256

## 2012-02-11 NOTE — ED Notes (Signed)
Patient given cranberry juice, per request.  PA wants patient to try oral fluid trial.  Will continue to monitor.

## 2012-02-11 NOTE — ED Provider Notes (Signed)
Medical screening examination/treatment/procedure(s) were performed by non-physician practitioner and as supervising physician I was immediately available for consultation/collaboration.   Gavin Pound. Cedric Denison, MD 02/11/12 1404

## 2012-02-11 NOTE — ED Notes (Signed)
Received bedside report from [place], [place].  Patient currently dozing off in bed; no respiratory or acute distress noted.  Second liter bolus of normal saline completed; third bolus of normal saline started.  Patient has no other questions or concerns at this time; will continue to monitor.

## 2012-02-12 ENCOUNTER — Emergency Department (HOSPITAL_COMMUNITY)
Admission: EM | Admit: 2012-02-12 | Discharge: 2012-02-13 | Disposition: A | Discharge status: Home / Self Care | Payer: Medicaid Other | Attending: Emergency Medicine | Admitting: Emergency Medicine

## 2012-02-12 ENCOUNTER — Encounter (HOSPITAL_COMMUNITY): Payer: Self-pay | Admitting: Emergency Medicine

## 2012-02-12 ENCOUNTER — Emergency Department (HOSPITAL_COMMUNITY): Payer: Medicaid Other

## 2012-02-12 DIAGNOSIS — R109 Unspecified abdominal pain: Secondary | ICD-10-CM | POA: Insufficient documentation

## 2012-02-12 DIAGNOSIS — R112 Nausea with vomiting, unspecified: Secondary | ICD-10-CM | POA: Insufficient documentation

## 2012-02-12 DIAGNOSIS — I10 Essential (primary) hypertension: Secondary | ICD-10-CM | POA: Insufficient documentation

## 2012-02-12 DIAGNOSIS — Z79899 Other long term (current) drug therapy: Secondary | ICD-10-CM | POA: Insufficient documentation

## 2012-02-12 DIAGNOSIS — J45909 Unspecified asthma, uncomplicated: Secondary | ICD-10-CM | POA: Insufficient documentation

## 2012-02-12 DIAGNOSIS — G4730 Sleep apnea, unspecified: Secondary | ICD-10-CM | POA: Insufficient documentation

## 2012-02-12 DIAGNOSIS — I252 Old myocardial infarction: Secondary | ICD-10-CM | POA: Insufficient documentation

## 2012-02-12 DIAGNOSIS — G609 Hereditary and idiopathic neuropathy, unspecified: Secondary | ICD-10-CM | POA: Insufficient documentation

## 2012-02-12 DIAGNOSIS — Z862 Personal history of diseases of the blood and blood-forming organs and certain disorders involving the immune mechanism: Secondary | ICD-10-CM | POA: Insufficient documentation

## 2012-02-12 DIAGNOSIS — F329 Major depressive disorder, single episode, unspecified: Secondary | ICD-10-CM | POA: Insufficient documentation

## 2012-02-12 DIAGNOSIS — K219 Gastro-esophageal reflux disease without esophagitis: Secondary | ICD-10-CM | POA: Insufficient documentation

## 2012-02-12 DIAGNOSIS — Z8719 Personal history of other diseases of the digestive system: Secondary | ICD-10-CM | POA: Insufficient documentation

## 2012-02-12 DIAGNOSIS — Z794 Long term (current) use of insulin: Secondary | ICD-10-CM | POA: Insufficient documentation

## 2012-02-12 DIAGNOSIS — Z8639 Personal history of other endocrine, nutritional and metabolic disease: Secondary | ICD-10-CM | POA: Insufficient documentation

## 2012-02-12 DIAGNOSIS — G8929 Other chronic pain: Secondary | ICD-10-CM | POA: Insufficient documentation

## 2012-02-12 DIAGNOSIS — Z8669 Personal history of other diseases of the nervous system and sense organs: Secondary | ICD-10-CM | POA: Insufficient documentation

## 2012-02-12 DIAGNOSIS — D509 Iron deficiency anemia, unspecified: Secondary | ICD-10-CM | POA: Insufficient documentation

## 2012-02-12 DIAGNOSIS — F411 Generalized anxiety disorder: Secondary | ICD-10-CM | POA: Insufficient documentation

## 2012-02-12 DIAGNOSIS — Z8619 Personal history of other infectious and parasitic diseases: Secondary | ICD-10-CM | POA: Insufficient documentation

## 2012-02-12 DIAGNOSIS — Z87891 Personal history of nicotine dependence: Secondary | ICD-10-CM | POA: Insufficient documentation

## 2012-02-12 DIAGNOSIS — F3289 Other specified depressive episodes: Secondary | ICD-10-CM | POA: Insufficient documentation

## 2012-02-12 DIAGNOSIS — IMO0001 Reserved for inherently not codable concepts without codable children: Secondary | ICD-10-CM | POA: Insufficient documentation

## 2012-02-12 DIAGNOSIS — Z7982 Long term (current) use of aspirin: Secondary | ICD-10-CM | POA: Insufficient documentation

## 2012-02-12 DIAGNOSIS — E785 Hyperlipidemia, unspecified: Secondary | ICD-10-CM | POA: Insufficient documentation

## 2012-02-12 DIAGNOSIS — E669 Obesity, unspecified: Secondary | ICD-10-CM | POA: Insufficient documentation

## 2012-02-12 DIAGNOSIS — E119 Type 2 diabetes mellitus without complications: Secondary | ICD-10-CM | POA: Insufficient documentation

## 2012-02-12 DIAGNOSIS — Z87440 Personal history of urinary (tract) infections: Secondary | ICD-10-CM | POA: Insufficient documentation

## 2012-02-12 MED ORDER — HYDROMORPHONE HCL PF 1 MG/ML IJ SOLN
1.0000 mg | Freq: Once | INTRAMUSCULAR | Status: AC
  Administered 2012-02-12: 1 mg via INTRAVENOUS
  Filled 2012-02-12: qty 1

## 2012-02-12 MED ORDER — PROMETHAZINE HCL 25 MG/ML IJ SOLN
25.0000 mg | Freq: Once | INTRAMUSCULAR | Status: AC
  Administered 2012-02-13: 25 mg via INTRAVENOUS
  Filled 2012-02-12: qty 1

## 2012-02-12 MED ORDER — SODIUM CHLORIDE 0.9 % IV BOLUS (SEPSIS)
1000.0000 mL | Freq: Once | INTRAVENOUS | Status: AC
  Administered 2012-02-13: 1000 mL via INTRAVENOUS

## 2012-02-12 NOTE — ED Notes (Signed)
Pt states she was seen at [facility] on Saturday for same and was put in CDU and was released yesterday  Pt states since she has been home she started having lower abd pain with nausea and vomiting

## 2012-02-12 NOTE — ED Provider Notes (Signed)
History     CSN: [id number]  Arrival date & time 02/12/12  1931   First MD Initiated Contact with Patient 02/12/12 2205      Chief Complaint  Patient presents with  . Nausea  . Emesis  . Abdominal Pain    (Consider location/radiation/quality/duration/timing/severity/associated sxs/prior treatment) HPI Comments: Shirley [name] is a morbidly obese, 36 year old female with chronic abdominal pain, who was scheduled for colonoscopy, and endoscopy by Dr. Thomasena Edis tomorrow.  She has been drinking.  The colon prep, and developed, nausea and increased abdominal pain.  She has not had any bowel movement to this point.  She states she took a Phenergan tablets prior to arrival, but has continued to vomit.  Patient is a 36 y.o. female presenting with vomiting and abdominal pain. The history is provided by the patient.  Emesis  This is a chronic problem. The problem has been gradually worsening. There has been no fever. Associated symptoms include abdominal pain. Pertinent negatives include no chills, no diarrhea and no fever.  Abdominal Pain The primary symptoms of the illness include abdominal pain, nausea and vomiting. The primary symptoms of the illness do not include fever or diarrhea.  Symptoms associated with the illness do not include chills or constipation.    Past Medical History  Diagnosis Date  . Thrombocytosis     Hem/Onc suggested 2/2 chronic hepatits and/or iron deficiency anemia  . Iron deficiency anemia   . N&V (nausea and vomiting)     and  Abdominal pain, chronic. Unclear ethiology with multiple admission and ED visits. Work up including Abdominal xray showed mostly nonspecific bowel gas pattern, CT abdomen with and without contrast (02/2011)  showed no acute process. Gastic Emptying scan (01/2010) was normal. Ultrasound of the abdomen was within normal limits. Hepatitis B viral load was undectable. HIV nonreactive. EGD (11/09/  . Abdominal pain   . Hypertension   .  Hyperlipidemia   . Diabetes mellitus   . Recurrent boils   . Back pain   . OSA (obstructive sleep apnea)   . 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 Cath 12/10- no restenosis or significant CAD progression  . Irregular menses     Small ovarian follicles seen on CT(9/06)  . History of pyelonephritis     H/o GrpB Pyelonephritis (9/06) and UTI- 07/11- E.Coli, 12/10- GBS  . Abscess of tunica vaginalis     10/09- Abundant S. aureus- sensitive to all abx  . Obesity   . Asthma   . Gastritis   . Depression   . Hepatitis B, chronic     Hep BeAb+,Hep B cAb+ & Hep BsAg+ (9/06)  . Peripheral neuropathy   . Fibromyalgia   . GERD (gastroesophageal reflux disease)   . Migraine   . Anxiety   . Urinary tract infection   . Gastroparesis     secondary to poorly controlled DM, last emptying study performed 01/2010  was normal but may be falsely positive as pt was on reglan  . MI (myocardial infarction) 06/15/2006  . Blood dyscrasia     Past Surgical History  Procedure Date  . Cesarean section 1997    Family History  Problem Relation Age of Onset  . Diabetes Father     History  Substance Use Topics  . Smoking status: Former Smoker    Types: Cigarettes    Quit date: 04/24/1996  . Smokeless  tobacco: Never Used   Comment: quit smoking cigarettes age 62  . Alcohol Use: Yes     03/31/11 "used marijuana last ~ 2 wk ago; only drink on occasions"    OB History    Grav Para Term Preterm Abortions TAB SAB Ect Mult Living                  Review of Systems  Constitutional: Negative for fever and chills.  Gastrointestinal: Positive for nausea, vomiting and abdominal pain. Negative for diarrhea and constipation.  Skin: Negative for rash.  Neurological: Negative for weakness.    Allergies  Lisinopril  Home Medications   Current Outpatient Rx  Name Route Sig Dispense Refill  .  ALBUTEROL SULFATE HFA 108 (90 BASE) MCG/ACT IN AERS Inhalation Inhale 2 puffs into the lungs every 4 (four) hours as needed. For shortness of breath 1 Inhaler 6  . AMITRIPTYLINE HCL 25 MG PO TABS Oral Take 2 tablets (50 mg total) by mouth at bedtime. 60 tablet 1  . ASPIRIN 325 MG PO TABS Oral Take 325 mg by mouth daily.     Marland Kitchen CLOPIDOGREL BISULFATE 75 MG PO TABS Oral Take 1 tablet (75 mg total) by mouth daily. 30 tablet 6  . DICLOFENAC SODIUM 75 MG PO TBEC Oral Take 1 tablet (75 mg total) by mouth 2 (two) times daily. 60 tablet 1  . FERROUS SULFATE 325 (65 FE) MG PO TABS Oral Take 325 mg by mouth daily with breakfast.    . FLUTICASONE-SALMETEROL 100-50 MCG/DOSE IN AEPB Inhalation Inhale 1 puff into the lungs every 12 (twelve) hours. 60 each 6  . IBUPROFEN 200 MG PO TABS Oral Take 400 mg by mouth every 6 (six) hours as needed. For pain    . INSULIN GLARGINE 100 UNIT/ML [place] SOLN Subcutaneous Inject 40 Units into the skin at bedtime. 10 mL   . LABETALOL HCL 100 MG PO TABS Oral Take 50 mg by mouth 2 (two) times daily.    Marland Kitchen METOCLOPRAMIDE HCL 10 MG PO TABS Oral Take 1 tablet (10 mg total) by mouth 4 (four) times daily - after meals and at bedtime. 120 tablet 0  . OMEPRAZOLE 40 MG PO CPDR Oral Take 1 capsule (40 mg total) by mouth daily. 31 capsule 6  . PANTOPRAZOLE SODIUM 40 MG PO TBEC Oral Take 1 tablet (40 mg total) by mouth daily. 30 tablet 0  . PROMETHAZINE HCL 25 MG RE SUPP Rectal Place 1 suppository (25 mg total) rectally every 6 (six) hours as needed for nausea. 12 each 0  . PROMETHAZINE HCL 25 MG PO TABS Oral Take 1 tablet (25 mg total) by mouth every 6 (six) hours as needed for nausea. 20 tablet 0    BP 167/124  Pulse 110  Temp 98.9 F (37.2 C) (Oral)  Resp 22  SpO2 96%  LMP 01/28/2012  Physical Exam  Constitutional: She is oriented to person, place, and time. She appears well-developed and well-nourished.  HENT:  Head: Normocephalic.  Neck: Normal range of motion.  Cardiovascular:  Tachycardia present.   Pulmonary/Chest: Effort normal. No respiratory distress.  Abdominal: Soft. She exhibits no distension. Bowel sounds are decreased. There is tenderness in the left lower quadrant.  Musculoskeletal: Normal range of motion.  Neurological: She is alert and oriented to person, place, and time.  Skin: Skin is warm and dry.    ED Course  Procedures (including critical care time)  Labs Reviewed - No data to display Dg Abd Acute  W/chest  02/13/2012  *RADIOLOGY REPORT*  Clinical Data: Left-sided abdominal pain  ACUTE ABDOMEN SERIES (ABDOMEN 2 VIEW & CHEST 1 VIEW)  Comparison: 12/18/2011  Findings: The lungs remain clear.  Cardiomediastinal contours within normal range.  No free intraperitoneal air. The bowel gas pattern is non- obstructive. Organ outlines are normal where seen. No acute or aggressive osseous abnormality identified.  IMPRESSION: Nonobstructive bowel gas pattern.   Original Report Authenticated By: Waneta Martins, M.D.      1. Chronic abdominal pain       MDM  Patient was IV hydrated.  She had no further episodes of abdominal cramping, nausea, or vomiting.  Her acute abdomen series was reviewed and there is no obstruction.  She is being sent home to continue her bowel prep for a colonoscopy and endoscopy in the morning        Arman Filter, NP 02/13/12 0109  Arman Filter, NP 02/13/12 2956

## 2012-02-12 NOTE — ED Notes (Signed)
Pt states she is scheduled for a colonoscopy and endoscopy with Dr Elnoria Howard tomorrow  Pt states she has been taking the bowel prep and has made the vomiting and pain worse

## 2012-02-12 NOTE — ED Provider Notes (Signed)
Medical screening examination/treatment/procedure(s) were performed by non-physician practitioner and as supervising physician I was immediately available for consultation/collaboration.    Vida Roller, MD 02/12/12 [phone]

## 2012-02-12 NOTE — ED Notes (Signed)
Refused to go to radiology. IV start attempted x 2. IV team called.

## 2012-02-13 MED ORDER — OXYCODONE-ACETAMINOPHEN 5-325 MG PO TABS
ORAL_TABLET | ORAL | Status: AC
Start: 2012-02-13 — End: 2012-02-13
  Administered 2012-02-13: 1
  Filled 2012-02-13: qty 1

## 2012-02-13 MED ORDER — OXYCODONE-ACETAMINOPHEN 5-325 MG PO TABS
2.0000 | ORAL_TABLET | Freq: Once | ORAL | Status: AC
  Administered 2012-02-13: 2 via ORAL
  Filled 2012-02-13: qty 1

## 2012-02-13 NOTE — ED Notes (Signed)
Patient transported to X-ray 

## 2012-02-13 NOTE — ED Provider Notes (Signed)
Medical screening examination/treatment/procedure(s) were performed by non-physician practitioner and as supervising physician I was immediately available for consultation/collaboration.   Jace Dowe R Camilo Mander, MD 02/13/12 1609 

## 2012-02-21 ENCOUNTER — Encounter: Payer: Self-pay | Admitting: Internal Medicine

## 2012-02-21 NOTE — Progress Notes (Signed)
  This patient is a CHRONIC NO-SHOW PATIENT that has a history of HYPERTENSION.  Please make sure to address hypertension during her next clinic appointment, and intervene as appropriate.    Within the AVS, please incorporate the following smartphrase: .htntips   Pertinent Data: BP Readings from Last 3 Encounters:  02/13/12 152/104  02/11/12 113/77  02/08/12 141/95    BMI: Estimated Body mass index is 32.39 kg/(m^2) as calculated from the following:   Height as of 02/08/12: 5\' 8" (1.727 m).   Weight as of 02/08/12: 213 lb(96.616 kg).  Smoking History: History  Smoking status  . Former Smoker  . Types: Cigarettes  . Quit date: 04/24/1996  Smokeless tobacco  . Never Used  Comment: quit smoking cigarettes age 3    Last Basic Metabolic Panel:    Component Value Date/Time   NA 137 02/11/2012 0056   K 4.0 02/11/2012 0056   CL 100 02/11/2012 0056   CO2 23 02/11/2012 0056   BUN 6 02/11/2012 0056   CREATININE 0.59 02/11/2012 0056   CREATININE 0.63 03/30/2011 1645   GLUCOSE 296* 02/11/2012 0056   CALCIUM 9.8 02/11/2012 0056    Allergies: Allergies  Allergen Reactions  . Lisinopril Nausea Only

## 2012-02-26 ENCOUNTER — Ambulatory Visit: Payer: Medicaid Other | Admitting: Internal Medicine

## 2012-02-29 ENCOUNTER — Emergency Department (HOSPITAL_COMMUNITY)
Admission: EM | Admit: 2012-02-29 | Discharge: 2012-02-29 | Disposition: A | Discharge status: Home / Self Care | Payer: Medicaid Other | Attending: Emergency Medicine | Admitting: Emergency Medicine

## 2012-02-29 ENCOUNTER — Encounter (HOSPITAL_COMMUNITY): Payer: Self-pay | Admitting: Emergency Medicine

## 2012-02-29 DIAGNOSIS — Z87891 Personal history of nicotine dependence: Secondary | ICD-10-CM | POA: Insufficient documentation

## 2012-02-29 DIAGNOSIS — E785 Hyperlipidemia, unspecified: Secondary | ICD-10-CM | POA: Insufficient documentation

## 2012-02-29 DIAGNOSIS — B181 Chronic viral hepatitis B without delta-agent: Secondary | ICD-10-CM | POA: Insufficient documentation

## 2012-02-29 DIAGNOSIS — R1032 Left lower quadrant pain: Secondary | ICD-10-CM | POA: Insufficient documentation

## 2012-02-29 DIAGNOSIS — I2510 Atherosclerotic heart disease of native coronary artery without angina pectoris: Secondary | ICD-10-CM | POA: Insufficient documentation

## 2012-02-29 DIAGNOSIS — Z79899 Other long term (current) drug therapy: Secondary | ICD-10-CM | POA: Insufficient documentation

## 2012-02-29 DIAGNOSIS — G43909 Migraine, unspecified, not intractable, without status migrainosus: Secondary | ICD-10-CM | POA: Insufficient documentation

## 2012-02-29 DIAGNOSIS — F329 Major depressive disorder, single episode, unspecified: Secondary | ICD-10-CM | POA: Insufficient documentation

## 2012-02-29 DIAGNOSIS — E119 Type 2 diabetes mellitus without complications: Secondary | ICD-10-CM | POA: Insufficient documentation

## 2012-02-29 DIAGNOSIS — D509 Iron deficiency anemia, unspecified: Secondary | ICD-10-CM | POA: Insufficient documentation

## 2012-02-29 DIAGNOSIS — J45909 Unspecified asthma, uncomplicated: Secondary | ICD-10-CM | POA: Insufficient documentation

## 2012-02-29 DIAGNOSIS — Z794 Long term (current) use of insulin: Secondary | ICD-10-CM | POA: Insufficient documentation

## 2012-02-29 DIAGNOSIS — F3289 Other specified depressive episodes: Secondary | ICD-10-CM | POA: Insufficient documentation

## 2012-02-29 DIAGNOSIS — G8929 Other chronic pain: Secondary | ICD-10-CM | POA: Insufficient documentation

## 2012-02-29 DIAGNOSIS — I252 Old myocardial infarction: Secondary | ICD-10-CM | POA: Insufficient documentation

## 2012-02-29 DIAGNOSIS — Z87440 Personal history of urinary (tract) infections: Secondary | ICD-10-CM | POA: Insufficient documentation

## 2012-02-29 DIAGNOSIS — Z7982 Long term (current) use of aspirin: Secondary | ICD-10-CM | POA: Insufficient documentation

## 2012-02-29 DIAGNOSIS — E669 Obesity, unspecified: Secondary | ICD-10-CM | POA: Insufficient documentation

## 2012-02-29 DIAGNOSIS — Z8742 Personal history of other diseases of the female genital tract: Secondary | ICD-10-CM | POA: Insufficient documentation

## 2012-02-29 DIAGNOSIS — G4730 Sleep apnea, unspecified: Secondary | ICD-10-CM | POA: Insufficient documentation

## 2012-02-29 DIAGNOSIS — I10 Essential (primary) hypertension: Secondary | ICD-10-CM | POA: Insufficient documentation

## 2012-02-29 LAB — URINALYSIS, ROUTINE W REFLEX MICROSCOPIC
Ketones, ur: 15 mg/dL — AB
Leukocytes, UA: NEGATIVE
Nitrite: NEGATIVE
Protein, ur: NEGATIVE mg/dL

## 2012-02-29 LAB — WET PREP, GENITAL
Clue Cells Wet Prep HPF POC: NONE SEEN
Trich, Wet Prep: NONE SEEN

## 2012-02-29 LAB — URINE MICROSCOPIC-ADD ON

## 2012-02-29 LAB — GLUCOSE, CAPILLARY: Glucose-Capillary: 230 mg/dL — ABNORMAL HIGH (ref 70–99)

## 2012-02-29 MED ORDER — KETOROLAC TROMETHAMINE 60 MG/2ML IM SOLN
60.0000 mg | Freq: Once | INTRAMUSCULAR | Status: AC
  Administered 2012-02-29: 60 mg via INTRAMUSCULAR
  Filled 2012-02-29: qty 2

## 2012-02-29 MED ORDER — OXYCODONE-ACETAMINOPHEN 5-325 MG PO TABS
2.0000 | ORAL_TABLET | Freq: Once | ORAL | Status: AC
  Administered 2012-02-29: 2 via ORAL
  Filled 2012-02-29: qty 2

## 2012-02-29 MED ORDER — PROMETHAZINE HCL 25 MG RE SUPP: 25.0000 mg | Freq: Four times a day (QID) | RECTAL | Status: DC | PRN

## 2012-02-29 NOTE — ED Provider Notes (Signed)
History     CSN: [id number]  Arrival date & time 02/29/12  5784   First MD Initiated Contact with Patient 02/29/12 0316      Chief Complaint  Patient presents with  . Abdominal Pain    (Consider location/radiation/quality/duration/timing/severity/associated sxs/prior treatment) HPI Comments: 36 y/o female with chronic abd pain and gastroparesis = presents with LLQ abd pain which she states is similar to prior abd pain that she had chronically, associated with 2 days of intermittent nausea.  She then had the urge to urinate this evening and had some vaginal bleeding - states she passed a couple of clots this evening.  Denies f/c/cough.  Denies diarrhea or rectal bleeding.  Sx are intermittent  Patient is a 36 y.o. female presenting with abdominal pain. The history is provided by the patient, medical records and the EMS personnel.  Abdominal Pain The primary symptoms of the illness include abdominal pain, nausea and vaginal bleeding. The primary symptoms of the illness do not include fever or dysuria.  Symptoms associated with the illness do not include chills.    Past Medical History  Diagnosis Date  . Thrombocytosis     Hem/Onc suggested 2/2 chronic hepatits and/or iron deficiency anemia  . Iron deficiency anemia   . N&V (nausea and vomiting)     and  Abdominal pain, chronic. Unclear ethiology with multiple admission and ED visits. Work up including Abdominal xray showed mostly nonspecific bowel gas pattern, CT abdomen with and without contrast (02/2011)  showed no acute process. Gastic Emptying scan (01/2010) was normal. Ultrasound of the abdomen was within normal limits. Hepatitis B viral load was undectable. HIV nonreactive. EGD (11/09/  . Abdominal pain   . Hypertension   . Hyperlipidemia   . Diabetes mellitus   . Recurrent boils   . Back pain   . OSA (obstructive sleep apnea)   . CAD (coronary artery disease)     s/p Subendocardial MI with PDA angioplasty(no stent) on  06/15/06 and relook  cath 06/19/06 showed patency of site. Cath 12/10- no restenosis or significant CAD progression  . Irregular menses     Small ovarian follicles seen on CT(9/06)  . History of pyelonephritis     H/o GrpB Pyelonephritis (9/06) and UTI- 07/11- E.Coli, 12/10- GBS  . Abscess of tunica vaginalis     10/09- Abundant S. aureus- sensitive to all abx  . Obesity   . Asthma   . Gastritis   . Depression   . Hepatitis B, chronic     Hep BeAb+,Hep B cAb+ & Hep BsAg+ (9/06)  . Peripheral neuropathy   . Fibromyalgia   . GERD (gastroesophageal reflux disease)   . Migraine   . Anxiety   . Urinary tract infection   . Gastroparesis     secondary to poorly controlled DM, last emptying study performed 01/2010  was normal but may be falsely positive as pt was on reglan  . MI (myocardial infarction) 06/15/2006  . Blood dyscrasia     Past Surgical History  Procedure Date  . Cesarean section 1997    Family History  Problem Relation Age of Onset  . Diabetes Father     History  Substance Use Topics  . Smoking status: Former Smoker    Types: Cigarettes    Quit date: 04/24/1996  . Smokeless tobacco: Never Used     Comment: quit smoking cigarettes age 16  . Alcohol Use: Yes     Comment: 03/31/11 "used marijuana last ~ 2 wk  ago; only drink on occasions"    OB History    Grav Para Term Preterm Abortions TAB SAB Ect Mult Living                  Review of Systems  Constitutional: Negative for fever and chills.  Cardiovascular: Negative for chest pain.  Gastrointestinal: Positive for nausea and abdominal pain.  Genitourinary: Positive for vaginal bleeding. Negative for dysuria.    Allergies  Lisinopril  Home Medications   Current Outpatient Rx  Name  Route  Sig  Dispense  Refill  . ALBUTEROL SULFATE HFA 108 (90 BASE) MCG/ACT IN AERS   Inhalation   Inhale 2 puffs into the lungs every 4 (four) hours as needed. For shortness of breath   1 Inhaler   6   . ASPIRIN 325 MG  PO TABS   Oral   Take 325 mg by mouth daily.          Marland Kitchen FLUTICASONE-SALMETEROL 100-50 MCG/DOSE IN AEPB   Inhalation   Inhale 1 puff into the lungs every 12 (twelve) hours.   60 each   6   . IBUPROFEN 200 MG PO TABS   Oral   Take 400 mg by mouth every 6 (six) hours as needed. For pain         . INSULIN GLARGINE 100 UNIT/ML [place] SOLN   Subcutaneous   Inject 40 Units into the skin at bedtime.   10 mL      . LABETALOL HCL 100 MG PO TABS   Oral   Take 50 mg by mouth 2 (two) times daily.         Marland Kitchen OMEPRAZOLE 40 MG PO CPDR   Oral   Take 1 capsule (40 mg total) by mouth daily.   31 capsule   6   . PROMETHAZINE HCL 25 MG PO TABS   Oral   Take 25 mg by mouth every 6 (six) hours as needed. For nausea         . AMITRIPTYLINE HCL 25 MG PO TABS   Oral   Take 2 tablets (50 mg total) by mouth at bedtime.   60 tablet   1   . CLOPIDOGREL BISULFATE 75 MG PO TABS   Oral   Take 1 tablet (75 mg total) by mouth daily.   30 tablet   6   . DICLOFENAC SODIUM 75 MG PO TBEC   Oral   Take 1 tablet (75 mg total) by mouth 2 (two) times daily.   60 tablet   1   . METOCLOPRAMIDE HCL 10 MG PO TABS   Oral   Take 10 mg by mouth 4 (four) times daily. After meals and at bedtime         . PANTOPRAZOLE SODIUM 40 MG PO TBEC   Oral   Take 1 tablet (40 mg total) by mouth daily.   30 tablet   0   . PROMETHAZINE HCL 25 MG RE SUPP   Rectal   Place 25 mg rectally every 6 (six) hours as needed. For nausea         . PROMETHAZINE HCL 25 MG RE SUPP   Rectal   Place 1 suppository (25 mg total) rectally every 6 (six) hours as needed for nausea.   12 each   0     BP 142/95  Pulse 91  Temp 98.6 F (37 C) (Oral)  Resp 20  SpO2 98%  LMP 01/28/2012  Physical Exam  Nursing note and vitals reviewed. Constitutional: She appears well-developed and well-nourished. No distress.  HENT:  Head: Normocephalic and atraumatic.  Mouth/Throat: Oropharynx is clear and moist. No oropharyngeal  exudate.  Eyes: Conjunctivae normal and EOM are normal. Pupils are equal, round, and reactive to light. Right eye exhibits no discharge. Left eye exhibits no discharge. No scleral icterus.  Neck: Normal range of motion. Neck supple. No JVD present. No thyromegaly present.  Cardiovascular: Normal rate, regular rhythm, normal heart sounds and intact distal pulses.  Exam reveals no gallop and no friction rub.   No murmur heard. Pulmonary/Chest: Effort normal and breath sounds normal. No respiratory distress. She has no wheezes. She has no rales.  Abdominal: Soft. Bowel sounds are normal. She exhibits no distension and no mass. There is tenderness ( very soft abdomen with focal LLQ ttp but no guarding, no masses and no peritoneal signs.  Pt talks through exam without wincing.).  Genitourinary:       Chaperone present for entire exam, blood present in the vaginal vault, no cervical motion tenderness or adnexal tenderness or masses, no discharge. Normal external genitalia  Musculoskeletal: Normal range of motion. She exhibits no edema and no tenderness.  Lymphadenopathy:    She has no cervical adenopathy.  Neurological: She is alert. Coordination normal.  Skin: Skin is warm and dry. No rash noted. No erythema.  Psychiatric: She has a normal mood and affect. Her behavior is normal.    ED Course  Procedures (including critical care time)  Labs Reviewed  URINALYSIS, ROUTINE W REFLEX MICROSCOPIC - Abnormal; Notable for the following:    Color, Urine AMBER (*)  BIOCHEMICALS MAY BE AFFECTED BY COLOR   Specific Gravity, Urine 1.045 (*)     Glucose, UA >1000 (*)     Bilirubin Urine SMALL (*)     Ketones, ur 15 (*)     Urobilinogen, UA 2.0 (*)     All other components within normal limits  WET PREP, GENITAL - Abnormal; Notable for the following:    WBC, Wet Prep HPF POC FEW (*)     All other components within normal limits  URINE MICROSCOPIC-ADD ON - Abnormal; Notable for the following:    Squamous  Epithelial / LPF FEW (*)     All other components within normal limits  GLUCOSE, CAPILLARY - Abnormal; Notable for the following:    Glucose-Capillary 230 (*)     All other components within normal limits  PREGNANCY, URINE  GC/CHLAMYDIA PROBE AMP   No results found.   1. Chronic abdominal pain       MDM  R/o ectopic / pregnancy / UTI.    Urinalysis negative for infection or pregnancy, pelvic exam unremarkable other than small amount of blood, patient appears stable for discharge, she does have glucose urea and a CBG shows a blood sugar of 230, this is not significant at this time, patient does not have any guarding or significant tenderness on exam and with her history of chronic pain she can be discharged to followup with her family Dr. Lorain Childes given prior to discharge, was Percocet given prior to discharge, no prescriptions for pain medications. She will be given Phenergan rectal suppositories for home.      Vida Roller, MD 02/29/12 [phone]

## 2012-02-29 NOTE — ED Notes (Signed)
WUX:[id number]<WN> Expected date:02/29/12<BR> Expected time: 2:51 AM<BR> Means of arrival:Ambulance<BR> Comments:<BR> N/v

## 2012-02-29 NOTE — ED Notes (Signed)
Pt transported via EMS from home with Abd pain n/v x 2 days, pt also c/o passing clots. Pt states she was seen for same 2 weeks ago. IV est, Zofran 4mg  IVP given.

## 2012-03-01 ENCOUNTER — Encounter: Payer: Medicaid Other | Admitting: Internal Medicine

## 2012-04-07 IMAGING — CR DG CHEST 1V PORT
1 series · 1 of 1 positions shown · non-contrast
Comparison: 04/08/2009.

CLINICAL DATA: Chest pain.

PORTABLE CHEST - 1 VIEW

[AP]
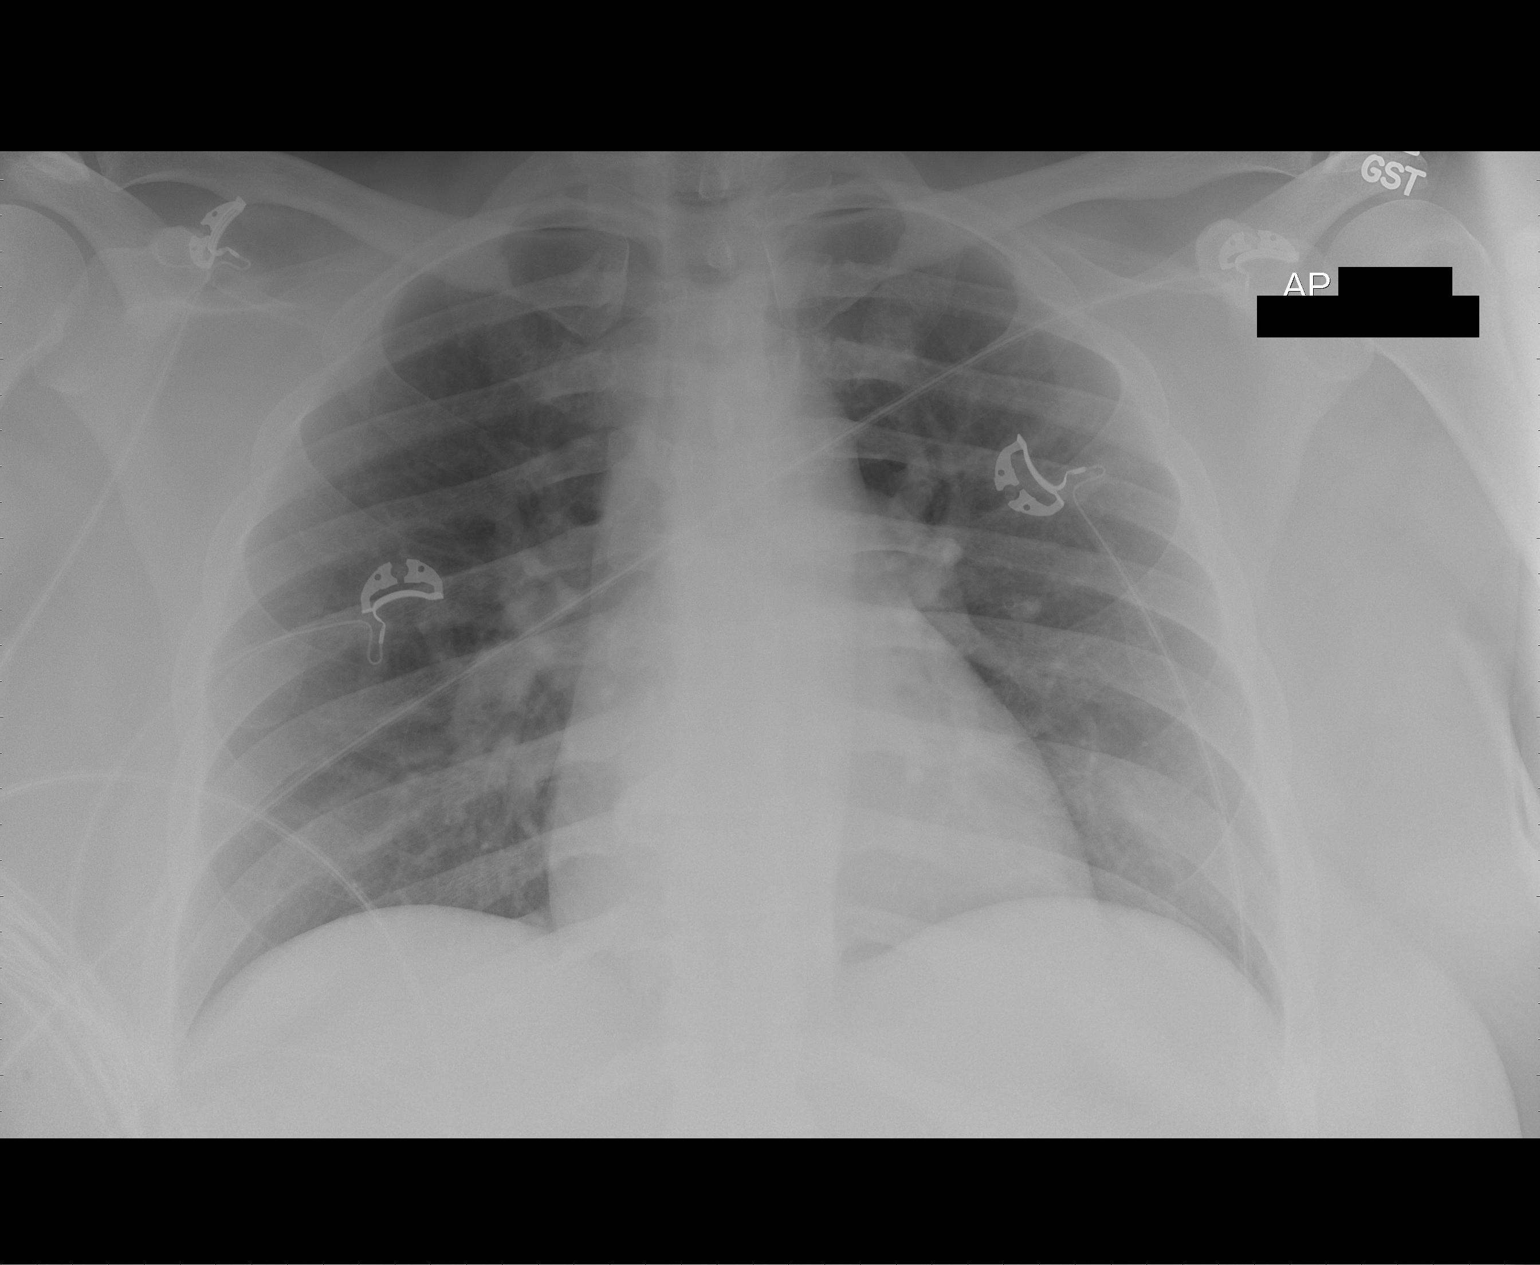

[1 of 1 positions shown; findings below may reference images not displayed]

FINDINGS: Lungs clear.  Cardiopericardial silhouette within normal
limits.  Trachea midline.  No airspace disease or effusion.
IMPRESSION: Negative single view chest.

## 2012-05-10 NOTE — Addendum Note (Signed)
Addended by: Neomia Dear on: 05/10/2012 04:39 PM   Modules accepted: Orders

## 2012-05-16 NOTE — Addendum Note (Signed)
Addended by: Neomia Dear on: 05/16/2012 07:06 PM   Modules accepted: Orders

## 2012-05-27 ENCOUNTER — Emergency Department (HOSPITAL_COMMUNITY)
Admission: EM | Admit: 2012-05-27 | Discharge: 2012-05-28 | Disposition: A | Discharge status: Home / Self Care | Payer: Medicaid Other | Attending: Emergency Medicine | Admitting: Emergency Medicine

## 2012-05-27 ENCOUNTER — Encounter (HOSPITAL_COMMUNITY): Payer: Self-pay | Admitting: *Deleted

## 2012-05-27 DIAGNOSIS — Z794 Long term (current) use of insulin: Secondary | ICD-10-CM | POA: Insufficient documentation

## 2012-05-27 DIAGNOSIS — R109 Unspecified abdominal pain: Secondary | ICD-10-CM

## 2012-05-27 DIAGNOSIS — R350 Frequency of micturition: Secondary | ICD-10-CM | POA: Insufficient documentation

## 2012-05-27 DIAGNOSIS — Z87440 Personal history of urinary (tract) infections: Secondary | ICD-10-CM | POA: Insufficient documentation

## 2012-05-27 DIAGNOSIS — F3289 Other specified depressive episodes: Secondary | ICD-10-CM | POA: Insufficient documentation

## 2012-05-27 DIAGNOSIS — Z8742 Personal history of other diseases of the female genital tract: Secondary | ICD-10-CM | POA: Insufficient documentation

## 2012-05-27 DIAGNOSIS — R1032 Left lower quadrant pain: Secondary | ICD-10-CM | POA: Insufficient documentation

## 2012-05-27 DIAGNOSIS — E669 Obesity, unspecified: Secondary | ICD-10-CM | POA: Insufficient documentation

## 2012-05-27 DIAGNOSIS — D473 Essential (hemorrhagic) thrombocythemia: Secondary | ICD-10-CM | POA: Insufficient documentation

## 2012-05-27 DIAGNOSIS — I10 Essential (primary) hypertension: Secondary | ICD-10-CM | POA: Insufficient documentation

## 2012-05-27 DIAGNOSIS — B181 Chronic viral hepatitis B without delta-agent: Secondary | ICD-10-CM | POA: Insufficient documentation

## 2012-05-27 DIAGNOSIS — E119 Type 2 diabetes mellitus without complications: Secondary | ICD-10-CM | POA: Insufficient documentation

## 2012-05-27 DIAGNOSIS — I2510 Atherosclerotic heart disease of native coronary artery without angina pectoris: Secondary | ICD-10-CM | POA: Insufficient documentation

## 2012-05-27 DIAGNOSIS — Z862 Personal history of diseases of the blood and blood-forming organs and certain disorders involving the immune mechanism: Secondary | ICD-10-CM | POA: Insufficient documentation

## 2012-05-27 DIAGNOSIS — R739 Hyperglycemia, unspecified: Secondary | ICD-10-CM

## 2012-05-27 DIAGNOSIS — R7309 Other abnormal glucose: Secondary | ICD-10-CM | POA: Insufficient documentation

## 2012-05-27 DIAGNOSIS — R110 Nausea: Secondary | ICD-10-CM | POA: Insufficient documentation

## 2012-05-27 DIAGNOSIS — K219 Gastro-esophageal reflux disease without esophagitis: Secondary | ICD-10-CM | POA: Insufficient documentation

## 2012-05-27 DIAGNOSIS — F329 Major depressive disorder, single episode, unspecified: Secondary | ICD-10-CM | POA: Insufficient documentation

## 2012-05-27 DIAGNOSIS — Z8719 Personal history of other diseases of the digestive system: Secondary | ICD-10-CM | POA: Insufficient documentation

## 2012-05-27 DIAGNOSIS — Z79899 Other long term (current) drug therapy: Secondary | ICD-10-CM | POA: Insufficient documentation

## 2012-05-27 DIAGNOSIS — I252 Old myocardial infarction: Secondary | ICD-10-CM | POA: Insufficient documentation

## 2012-05-27 DIAGNOSIS — N390 Urinary tract infection, site not specified: Secondary | ICD-10-CM | POA: Insufficient documentation

## 2012-05-27 DIAGNOSIS — Z87891 Personal history of nicotine dependence: Secondary | ICD-10-CM | POA: Insufficient documentation

## 2012-05-27 LAB — GLUCOSE, CAPILLARY: Glucose-Capillary: 515 mg/dL — ABNORMAL HIGH (ref 70–99)

## 2012-05-27 NOTE — ED Notes (Signed)
JXB:[id number]<GN> Expected date:05/27/12<BR> Expected time:10:58 PM<BR> Means of arrival:Ambulance<BR> Comments:<BR> abd pain

## 2012-05-27 NOTE — ED Notes (Signed)
Per EMS report: pt from home: pt c/o of lower quadrant abd pain x 2 days.  Pt describes the pain as "sharp."  Pt endorses nausea but denies V/D.  Pt reports being out of her medications. Pt hx of HTN, DM, Asthma.  BP: 178/P, HR: 70, RR: 18, Pain 10/10.  CBG: High

## 2012-05-28 LAB — COMPREHENSIVE METABOLIC PANEL
AST: 22 U/L (ref 0–37)
Albumin: 3.3 g/dL — ABNORMAL LOW (ref 3.5–5.2)
Alkaline Phosphatase: 77 U/L (ref 39–117)
BUN: 9 mg/dL (ref 6–23)
Chloride: 93 mEq/L — ABNORMAL LOW (ref 96–112)
Potassium: 4.3 mEq/L (ref 3.5–5.1)
Total Bilirubin: 0.2 mg/dL — ABNORMAL LOW (ref 0.3–1.2)

## 2012-05-28 LAB — CBC WITH DIFFERENTIAL/PLATELET
Basophils Absolute: 0.1 10*3/uL (ref 0.0–0.1)
Basophils Relative: 0 % (ref 0–1)
Hemoglobin: 11.1 g/dL — ABNORMAL LOW (ref 12.0–15.0)
Lymphocytes Relative: 21 % (ref 12–46)
MCHC: 33.2 g/dL (ref 30.0–36.0)
Monocytes Relative: 5 % (ref 3–12)
Neutro Abs: 10.7 10*3/uL — ABNORMAL HIGH (ref 1.7–7.7)
Neutrophils Relative %: 72 % (ref 43–77)
RBC: 5.05 MIL/uL (ref 3.87–5.11)
WBC: 15 10*3/uL — ABNORMAL HIGH (ref 4.0–10.5)

## 2012-05-28 LAB — URINALYSIS, ROUTINE W REFLEX MICROSCOPIC
Bilirubin Urine: NEGATIVE
Glucose, UA: >1000 mg/dL — AB
Nitrite: NEGATIVE
Specific Gravity, Urine: 1.031 — ABNORMAL HIGH (ref 1.005–1.030)
pH: 6.5 (ref 5.0–8.0)

## 2012-05-28 LAB — GLUCOSE, CAPILLARY
Glucose-Capillary: 373 mg/dL — ABNORMAL HIGH (ref 70–99)
Glucose-Capillary: 288 mg/dL — ABNORMAL HIGH (ref 70–99)
Glucose-Capillary: 275 mg/dL — ABNORMAL HIGH (ref 70–99)

## 2012-05-28 MED ORDER — METOCLOPRAMIDE HCL 5 MG/ML IJ SOLN
10.0000 mg | Freq: Once | INTRAMUSCULAR | Status: AC
  Administered 2012-05-28: 10 mg via INTRAVENOUS
  Filled 2012-05-28: qty 2

## 2012-05-28 MED ORDER — MORPHINE SULFATE 4 MG/ML IJ SOLN
4.0000 mg | Freq: Once | INTRAMUSCULAR | Status: AC
  Administered 2012-05-28: 4 mg via INTRAVENOUS
  Filled 2012-05-28: qty 1

## 2012-05-28 MED ORDER — HYDROMORPHONE HCL PF 1 MG/ML IJ SOLN
1.0000 mg | Freq: Once | INTRAMUSCULAR | Status: AC
  Administered 2012-05-28: 1 mg via INTRAVENOUS
  Filled 2012-05-28: qty 1

## 2012-05-28 MED ORDER — OXYCODONE-ACETAMINOPHEN 5-325 MG PO TABS: 1.0000 | ORAL_TABLET | ORAL | Status: DC | PRN

## 2012-05-28 MED ORDER — NITROFURANTOIN MONOHYD MACRO 100 MG PO CAPS: 100.0000 mg | ORAL_CAPSULE | Freq: Two times a day (BID) | ORAL | Status: DC

## 2012-05-28 MED ORDER — DEXTROSE 5 % IV SOLN
1.0000 g | Freq: Once | INTRAVENOUS | Status: AC
  Administered 2012-05-28: 1 g via INTRAVENOUS
  Filled 2012-05-28: qty 10

## 2012-05-28 MED ORDER — GI COCKTAIL ~~LOC~~
30.0000 mL | Freq: Once | ORAL | Status: AC
  Administered 2012-05-28: 30 mL via ORAL
  Filled 2012-05-28: qty 30

## 2012-05-28 MED ORDER — SODIUM CHLORIDE 0.9 % IV BOLUS (SEPSIS)
1000.0000 mL | Freq: Once | INTRAVENOUS | Status: AC
  Administered 2012-05-28: 1000 mL via INTRAVENOUS

## 2012-05-28 MED ORDER — ONDANSETRON HCL 4 MG/2ML IJ SOLN
4.0000 mg | Freq: Once | INTRAMUSCULAR | Status: AC
  Administered 2012-05-28: 4 mg via INTRAVENOUS
  Filled 2012-05-28: qty 2

## 2012-05-28 MED ORDER — ONDANSETRON HCL 4 MG/2ML IJ SOLN
4.0000 mg | Freq: Once | INTRAMUSCULAR | Status: AC
Start: 2012-05-28 — End: 2012-05-28
  Administered 2012-05-28: 4 mg via INTRAVENOUS
  Filled 2012-05-28: qty 2

## 2012-05-28 NOTE — ED Provider Notes (Signed)
Medical screening examination/treatment/procedure(s) were performed by non-physician practitioner and as supervising physician I was immediately available for consultation/collaboration.  Jones Skene, M.D.     Jones Skene, MD 05/28/12 1243

## 2012-05-28 NOTE — ED Notes (Signed)
Pt reports nausea has returned and pain is still a 7/10.  Theron Arista, PA made aware.

## 2012-05-28 NOTE — ED Notes (Signed)
Pt reports she went to the bathroom and pt threw up some.  Pt thinks she still has some in her throat.  Pt reports feeling worse.  Pain is still the same.

## 2012-05-28 NOTE — ED Provider Notes (Signed)
History     CSN: [id number]  Arrival date & time 05/27/12  2304   First MD Initiated Contact with Patient 05/28/12 0003      Chief Complaint  Patient presents with  . Abdominal Pain   HPI  History provided by the patient. Patient is a 37 year old female with history of diabetes who presents with complaints of lower left-sided abdominal pain and discomfort. Patient states she has long history of left-sided abdominal pains with continued pains that feel similar to night. Pain worsened over the last 2 days. Patient also states that she did not use any of her normal insulin today but has a prescription waiting at the pharmacy for refills. She did not check her sugar levels today. Her symptoms are described as sharp and severe and intermittent lasting a few seconds to minutes. Symptoms have also been associated with increased thirst and urinary frequency. She denies any fever, chills or sweats. She has some nausea but denies any vomiting or diarrhea. She has not used any treatment for her symptoms. She denies any other aggravating or alleviating factors.      Past Medical History  Diagnosis Date  . Thrombocytosis     Hem/Onc suggested 2/2 chronic hepatits and/or iron deficiency anemia  . Iron deficiency anemia   . N&V (nausea and vomiting)     and  Abdominal pain, chronic. Unclear ethiology with multiple admission and ED visits. Work up including Abdominal xray showed mostly nonspecific bowel gas pattern, CT abdomen with and without contrast (02/2011)  showed no acute process. Gastic Emptying scan (01/2010) was normal. Ultrasound of the abdomen was within normal limits. Hepatitis B viral load was undectable. HIV nonreactive. EGD (11/09/  . Abdominal pain   . Hypertension   . Hyperlipidemia   . Diabetes mellitus   . Recurrent boils   . Back pain   . OSA (obstructive sleep apnea)   . CAD (coronary artery disease)     s/p Subendocardial MI with PDA angioplasty(no stent) on 06/15/06 and  relook  cath 06/19/06 showed patency of site. Cath 12/10- no restenosis or significant CAD progression  . Irregular menses     Small ovarian follicles seen on CT(9/06)  . History of pyelonephritis     H/o GrpB Pyelonephritis (9/06) and UTI- 07/11- E.Coli, 12/10- GBS  . Abscess of tunica vaginalis     10/09- Abundant S. aureus- sensitive to all abx  . Obesity   . Asthma   . Gastritis   . Depression   . Hepatitis B, chronic     Hep BeAb+,Hep B cAb+ & Hep BsAg+ (9/06)  . Peripheral neuropathy   . Fibromyalgia   . GERD (gastroesophageal reflux disease)   . Migraine   . Anxiety   . Urinary tract infection   . Gastroparesis     secondary to poorly controlled DM, last emptying study performed 01/2010  was normal but may be falsely positive as pt was on reglan  . MI (myocardial infarction) 06/15/2006  . Blood dyscrasia     Past Surgical History  Procedure Date  . Cesarean section 1997    Family History  Problem Relation Age of Onset  . Diabetes Father     History  Substance Use Topics  . Smoking status: Former Smoker    Types: Cigarettes    Quit date: 04/24/1996  . Smokeless tobacco: Never Used     Comment: quit smoking cigarettes age 29  . Alcohol Use: Yes     Comment: 03/31/11 "used  marijuana last ~ 2 wk ago; only drink on occasions"    OB History    Grav Para Term Preterm Abortions TAB SAB Ect Mult Living                  Review of Systems  Constitutional: Negative for fever and chills.  Respiratory: Negative for cough.   Cardiovascular: Negative for chest pain.  Gastrointestinal: Positive for nausea and abdominal pain. Negative for vomiting, diarrhea and constipation.  Genitourinary: Positive for frequency. Negative for dysuria, hematuria, flank pain, vaginal bleeding and vaginal discharge.  Musculoskeletal: Negative for back pain.  All other systems reviewed and are negative.    Allergies  Lisinopril  Home Medications   Current Outpatient Rx  Name  Route   Sig  Dispense  Refill  . ALBUTEROL SULFATE HFA 108 (90 BASE) MCG/ACT IN AERS   Inhalation   Inhale 2 puffs into the lungs every 4 (four) hours as needed. For shortness of breath   1 Inhaler   6   . ASPIRIN 325 MG PO TABS   Oral   Take 325 mg by mouth daily.          Marland Kitchen FLUTICASONE-SALMETEROL 100-50 MCG/DOSE IN AEPB   Inhalation   Inhale 1 puff into the lungs every 12 (twelve) hours.   60 each   6   . IBUPROFEN 200 MG PO TABS   Oral   Take 400 mg by mouth every 6 (six) hours as needed. For pain         . INSULIN GLARGINE 100 UNIT/ML [place] SOLN   Subcutaneous   Inject 40 Units into the skin at bedtime.   10 mL      . LABETALOL HCL 100 MG PO TABS   Oral   Take 50 mg by mouth 2 (two) times daily.         Marland Kitchen METOCLOPRAMIDE HCL 10 MG PO TABS   Oral   Take 10 mg by mouth 4 (four) times daily. After meals and at bedtime         . OMEPRAZOLE 40 MG PO CPDR   Oral   Take 1 capsule (40 mg total) by mouth daily.   31 capsule   6   . PROMETHAZINE HCL 25 MG PO TABS   Oral   Take 25 mg by mouth every 6 (six) hours as needed. For nausea           BP 141/98  Pulse 82  Temp 97.5 F (36.4 C) (Oral)  Resp 20  Ht 5\' 7"  (1.702 m)  Wt 185 lb (83.915 kg)  BMI 28.97 kg/m2  SpO2 100%  LMP 05/20/2012  Physical Exam  Nursing note and vitals reviewed. Constitutional: She is oriented to person, place, and time. She appears well-developed and well-nourished. No distress.  HENT:  Head: Normocephalic and atraumatic.  Eyes: Conjunctivae normal are normal.  Neck: Normal range of motion. Neck supple.  Cardiovascular: Normal rate and regular rhythm.   Pulmonary/Chest: Effort normal and breath sounds normal.  Abdominal: Soft. She exhibits no distension. There is tenderness in the suprapubic area. There is no rebound and no guarding.       Obese. Very mild left-sided tenderness. Moderate suprapubic tenderness. No pertinent signs.  Musculoskeletal: Normal range of motion. She  exhibits no edema.  Neurological: She is alert and oriented to person, place, and time.  Skin: Skin is warm and dry. No rash noted.  Psychiatric: She has a normal mood and affect.  Her behavior is normal.    ED Course  Procedures    Results for orders placed during the hospital encounter of 05/27/12  URINALYSIS, ROUTINE W REFLEX MICROSCOPIC      Component Value Range   Color, Urine YELLOW  YELLOW   APPearance CLEAR  CLEAR   Specific Gravity, Urine 1.031 (*) 1.005 - 1.030   pH 6.5  5.0 - 8.0   Glucose, UA >1000 (*) NEGATIVE mg/dL   Hgb urine dipstick SMALL (*) NEGATIVE   Bilirubin Urine NEGATIVE  NEGATIVE   Ketones, ur NEGATIVE  NEGATIVE mg/dL   Protein, ur NEGATIVE  NEGATIVE mg/dL   Urobilinogen, UA 1.0  0.0 - 1.0 mg/dL   Nitrite NEGATIVE  NEGATIVE   Leukocytes, UA SMALL (*) NEGATIVE  PREGNANCY, URINE      Component Value Range   Preg Test, Ur NEGATIVE  NEGATIVE  GLUCOSE, CAPILLARY      Component Value Range   Glucose-Capillary 515 (*) 70 - 99 mg/dL   Comment 1 Notify RN    CBC WITH DIFFERENTIAL      Component Value Range   WBC 15.0 (*) 4.0 - 10.5 K/uL   RBC 5.05  3.87 - 5.11 MIL/uL   Hemoglobin 11.1 (*) 12.0 - 15.0 g/dL   HCT 65.7 (*) 84.6 - 96.2 %   MCV 66.1 (*) 78.0 - 100.0 fL   MCH 22.0 (*) 26.0 - 34.0 pg   MCHC 33.2  30.0 - 36.0 g/dL   RDW 95.2 (*) 84.1 - 32.4 %   Platelets 506 (*) 150 - 400 K/uL   Neutrophils Relative 72  43 - 77 %   Neutro Abs 10.7 (*) 1.7 - 7.7 K/uL   Lymphocytes Relative 21  12 - 46 %   Lymphs Abs 3.1  0.7 - 4.0 K/uL   Monocytes Relative 5  3 - 12 %   Monocytes Absolute 0.8  0.1 - 1.0 K/uL   Eosinophils Relative 2  0 - 5 %   Eosinophils Absolute 0.3  0.0 - 0.7 K/uL   Basophils Relative 0  0 - 1 %   Basophils Absolute 0.1  0.0 - 0.1 K/uL  COMPREHENSIVE METABOLIC PANEL      Component Value Range   Sodium 128 (*) 135 - 145 mEq/L   Potassium 4.3  3.5 - 5.1 mEq/L   Chloride 93 (*) 96 - 112 mEq/L   CO2 24  19 - 32 mEq/L   Glucose, Bld 535  (*) 70 - 99 mg/dL   BUN 9  6 - 23 mg/dL   Creatinine, Ser 4.01  0.50 - 1.10 mg/dL   Calcium 9.5  8.4 - 02.7 mg/dL   Total Protein 8.6 (*) 6.0 - 8.3 g/dL   Albumin 3.3 (*) 3.5 - 5.2 g/dL   AST 22  0 - 37 U/L   ALT 9  0 - 35 U/L   Alkaline Phosphatase 77  39 - 117 U/L   Total Bilirubin 0.2 (*) 0.3 - 1.2 mg/dL   GFR calc non Af Amer >90  >90 mL/min   GFR calc Af Amer >90  >90 mL/min  LIPASE, BLOOD      Component Value Range   Lipase 45  11 - 59 U/L  URINE MICROSCOPIC-ADD ON      Component Value Range   Squamous Epithelial / LPF RARE  RARE   WBC, UA 11-20  <3 WBC/hpf   RBC / HPF 7-10  <3 RBC/hpf  GLUCOSE, CAPILLARY  Component Value Range   Glucose-Capillary 373 (*) 70 - 99 mg/dL   Comment 1 Documented in Chart     Comment 2 Notify RN          1. UTI (lower urinary tract infection)   2. Hyperglycemia   3. Abdominal pain       MDM  Patient seen and evaluated. Patient appears well in no acute distress. She does not appear in any significant discomfort.  CBG elevated in the 500s.  Lab tests do not show any signs for anion gap or DKA at this time. Patient does have signs of UTI based on UA. Antibiotics ordered. Will continue fluids and recheck sugar for improvement.   After a liter bolus blood sugar now in 300s. Will continue fluids and recheck.  Sugar has continued to improve now in the 200s. At this time patient felt stable for discharge home. She'll be given prescriptions for an antibiotic. She will followup with PCP for continued evaluation and treatment.      Angus Seller, [place] 05/28/12 [phone]

## 2012-06-18 ENCOUNTER — Telehealth: Payer: Self-pay | Admitting: *Deleted

## 2012-06-18 NOTE — Telephone Encounter (Signed)
Pt request refill on Lyrica 75 mg.  Med not listed. I called pt and she has taken in past but stopped because she could not afford.

## 2012-06-19 ENCOUNTER — Encounter (HOSPITAL_COMMUNITY): Payer: Self-pay

## 2012-06-19 ENCOUNTER — Emergency Department (HOSPITAL_COMMUNITY): Payer: Medicaid Other

## 2012-06-19 ENCOUNTER — Inpatient Hospital Stay (HOSPITAL_COMMUNITY)
Admission: EM | Admit: 2012-06-19 | Discharge: 2012-06-21 | DRG: 074 | Disposition: A | Discharge status: Home / Self Care | Payer: Medicaid Other | Attending: Internal Medicine | Admitting: Internal Medicine

## 2012-06-19 DIAGNOSIS — F3289 Other specified depressive episodes: Secondary | ICD-10-CM | POA: Diagnosis present

## 2012-06-19 DIAGNOSIS — J45909 Unspecified asthma, uncomplicated: Secondary | ICD-10-CM | POA: Diagnosis present

## 2012-06-19 DIAGNOSIS — E1165 Type 2 diabetes mellitus with hyperglycemia: Secondary | ICD-10-CM | POA: Diagnosis present

## 2012-06-19 DIAGNOSIS — Z9861 Coronary angioplasty status: Secondary | ICD-10-CM

## 2012-06-19 DIAGNOSIS — I252 Old myocardial infarction: Secondary | ICD-10-CM

## 2012-06-19 DIAGNOSIS — B1910 Unspecified viral hepatitis B without hepatic coma: Secondary | ICD-10-CM | POA: Diagnosis present

## 2012-06-19 DIAGNOSIS — G609 Hereditary and idiopathic neuropathy, unspecified: Secondary | ICD-10-CM | POA: Diagnosis present

## 2012-06-19 DIAGNOSIS — Z87891 Personal history of nicotine dependence: Secondary | ICD-10-CM

## 2012-06-19 DIAGNOSIS — F329 Major depressive disorder, single episode, unspecified: Secondary | ICD-10-CM | POA: Diagnosis present

## 2012-06-19 DIAGNOSIS — D649 Anemia, unspecified: Secondary | ICD-10-CM | POA: Diagnosis present

## 2012-06-19 DIAGNOSIS — Z9119 Patient's noncompliance with other medical treatment and regimen: Secondary | ICD-10-CM

## 2012-06-19 DIAGNOSIS — E118 Type 2 diabetes mellitus with unspecified complications: Secondary | ICD-10-CM

## 2012-06-19 DIAGNOSIS — E669 Obesity, unspecified: Secondary | ICD-10-CM | POA: Diagnosis present

## 2012-06-19 DIAGNOSIS — E1149 Type 2 diabetes mellitus with other diabetic neurological complication: Principal | ICD-10-CM | POA: Diagnosis present

## 2012-06-19 DIAGNOSIS — K219 Gastro-esophageal reflux disease without esophagitis: Secondary | ICD-10-CM | POA: Diagnosis present

## 2012-06-19 DIAGNOSIS — B181 Chronic viral hepatitis B without delta-agent: Secondary | ICD-10-CM

## 2012-06-19 DIAGNOSIS — G4733 Obstructive sleep apnea (adult) (pediatric): Secondary | ICD-10-CM | POA: Diagnosis present

## 2012-06-19 DIAGNOSIS — R112 Nausea with vomiting, unspecified: Secondary | ICD-10-CM

## 2012-06-19 DIAGNOSIS — D539 Nutritional anemia, unspecified: Secondary | ICD-10-CM

## 2012-06-19 DIAGNOSIS — E876 Hypokalemia: Secondary | ICD-10-CM | POA: Diagnosis present

## 2012-06-19 DIAGNOSIS — I2510 Atherosclerotic heart disease of native coronary artery without angina pectoris: Secondary | ICD-10-CM | POA: Diagnosis present

## 2012-06-19 DIAGNOSIS — I10 Essential (primary) hypertension: Secondary | ICD-10-CM | POA: Diagnosis present

## 2012-06-19 DIAGNOSIS — D72829 Elevated white blood cell count, unspecified: Secondary | ICD-10-CM | POA: Diagnosis present

## 2012-06-19 DIAGNOSIS — F411 Generalized anxiety disorder: Secondary | ICD-10-CM | POA: Diagnosis present

## 2012-06-19 DIAGNOSIS — E785 Hyperlipidemia, unspecified: Secondary | ICD-10-CM | POA: Diagnosis present

## 2012-06-19 DIAGNOSIS — E869 Volume depletion, unspecified: Secondary | ICD-10-CM | POA: Diagnosis present

## 2012-06-19 DIAGNOSIS — G8929 Other chronic pain: Secondary | ICD-10-CM | POA: Diagnosis present

## 2012-06-19 DIAGNOSIS — F1910 Other psychoactive substance abuse, uncomplicated: Secondary | ICD-10-CM | POA: Diagnosis present

## 2012-06-19 DIAGNOSIS — Z91199 Patient's noncompliance with other medical treatment and regimen due to unspecified reason: Secondary | ICD-10-CM

## 2012-06-19 DIAGNOSIS — K3184 Gastroparesis: Secondary | ICD-10-CM | POA: Diagnosis present

## 2012-06-19 DIAGNOSIS — Z79899 Other long term (current) drug therapy: Secondary | ICD-10-CM

## 2012-06-19 HISTORY — DX: Type 2 diabetes mellitus with hyperglycemia: E11.65

## 2012-06-19 HISTORY — DX: Type 2 diabetes mellitus with unspecified complications: E11.8

## 2012-06-19 HISTORY — DX: Reserved for concepts with insufficient information to code with codable children: IMO0002

## 2012-06-19 LAB — RAPID URINE DRUG SCREEN, HOSP PERFORMED
Amphetamines: NOT DETECTED
Barbiturates: NOT DETECTED
Benzodiazepines: NOT DETECTED
Cocaine: POSITIVE — AB
Tetrahydrocannabinol: POSITIVE — AB

## 2012-06-19 LAB — DIFFERENTIAL
Basophils Relative: 0 % (ref 0–1)
Eosinophils Absolute: 0.3 10*3/uL (ref 0.0–0.7)
Lymphs Abs: 2.8 10*3/uL (ref 0.7–4.0)
Monocytes Absolute: 0.8 10*3/uL (ref 0.1–1.0)
Monocytes Relative: 6 % (ref 3–12)

## 2012-06-19 LAB — URINALYSIS, ROUTINE W REFLEX MICROSCOPIC
Bilirubin Urine: NEGATIVE
Glucose, UA: >1000 mg/dL — AB
Hgb urine dipstick: NEGATIVE
Ketones, ur: 40 mg/dL — AB
Nitrite: NEGATIVE
pH: 7 (ref 5.0–8.0)

## 2012-06-19 LAB — COMPREHENSIVE METABOLIC PANEL
ALT: 5 U/L (ref 0–35)
Albumin: 3.3 g/dL — ABNORMAL LOW (ref 3.5–5.2)
Alkaline Phosphatase: 78 U/L (ref 39–117)
BUN: 8 mg/dL (ref 6–23)
Chloride: 99 mEq/L (ref 96–112)
Potassium: 4 mEq/L (ref 3.5–5.1)
Total Bilirubin: 0.4 mg/dL (ref 0.3–1.2)

## 2012-06-19 LAB — LIPASE, BLOOD: Lipase: 27 U/L (ref 11–59)

## 2012-06-19 LAB — CBC
HCT: 34.3 % — ABNORMAL LOW (ref 36.0–46.0)
Hemoglobin: 11.3 g/dL — ABNORMAL LOW (ref 12.0–15.0)
RDW: 18 % — ABNORMAL HIGH (ref 11.5–15.5)
WBC: 12.1 10*3/uL — ABNORMAL HIGH (ref 4.0–10.5)

## 2012-06-19 MED ORDER — FENTANYL CITRATE 0.05 MG/ML IJ SOLN
50.0000 ug | Freq: Once | INTRAMUSCULAR | Status: AC
  Administered 2012-06-19: 50 ug via INTRAVENOUS
  Filled 2012-06-19: qty 2

## 2012-06-19 MED ORDER — SODIUM CHLORIDE 0.9 % IV BOLUS (SEPSIS)
1000.0000 mL | Freq: Once | INTRAVENOUS | Status: AC
  Administered 2012-06-19: 1000 mL via INTRAVENOUS

## 2012-06-19 MED ORDER — FENTANYL CITRATE 0.05 MG/ML IJ SOLN
100.0000 ug | Freq: Once | INTRAMUSCULAR | Status: AC
  Administered 2012-06-19: 100 ug via INTRAVENOUS
  Filled 2012-06-19: qty 2

## 2012-06-19 MED ORDER — PROMETHAZINE HCL 25 MG/ML IJ SOLN
25.0000 mg | Freq: Once | INTRAMUSCULAR | Status: AC
  Administered 2012-06-19: 25 mg via INTRAVENOUS
  Filled 2012-06-19: qty 1

## 2012-06-19 MED ORDER — GI COCKTAIL ~~LOC~~
30.0000 mL | Freq: Once | ORAL | Status: AC
  Administered 2012-06-19: 30 mL via ORAL
  Filled 2012-06-19: qty 30

## 2012-06-19 MED ORDER — ONDANSETRON 8 MG/NS 50 ML IVPB
8.0000 mg | Freq: Once | INTRAVENOUS | Status: AC
  Administered 2012-06-19: 8 mg via INTRAVENOUS
  Filled 2012-06-19: qty 8

## 2012-06-19 NOTE — ED Provider Notes (Signed)
I saw and evaluated the patient, reviewed the resident's note and I agree with the findings and plan.   .Face to face Exam:  General:  Awake HEENT:  Atraumatic Resp:  Normal effort Abd:  Nondistended Neuro:No focal weakness Lymph: No adenopathy   Nelia Shi, MD 06/19/12 2244

## 2012-06-19 NOTE — ED Notes (Signed)
MD at bedside. 

## 2012-06-19 NOTE — ED Notes (Addendum)
Pt observed retching and vomiting a small amount of yellow liquid.  [place], MD made aware.

## 2012-06-19 NOTE — ED Notes (Signed)
Resident made aware of pt's pain and nausea.

## 2012-06-19 NOTE — ED Notes (Signed)
Pt c/o N/V since yesterday. Pt also states she has LLQ abdominal pain since yesterday. Pt states the pain is constant and sharp. States she has had this same type of pain before in the same place. States she took some Tylenol at 1500 for pain, but it is not helping. Pt states she has a ride home. Pt ambulatory to exam room with steady gait.

## 2012-06-19 NOTE — ED Provider Notes (Signed)
History     CSN: [id number]  Arrival date & time 06/19/12  1603   First MD Initiated Contact with Patient 06/19/12 1605      Chief Complaint  Patient presents with  . Emesis  . Abdominal Pain    HPI Pt is a 36 yo F with PMH of DM, episodic N/V, HTN who presents with 1 day history of LLQ abd pain, nausea and vomiting. She states this feels like her other episodes with similar prodrome and pain. No fevers, diarrhea, bloody emesis or known sick contacts. She has tried Tylenol at home, but she does not have any of her prescription medications at home and did not have anything to try for the nausea. Pt states her menstrual period started yesterday as well, and states that her pain may or may not be related to her cycle. She is an insulin requiring diabetic and has not taken any medications including her insulin for at least one month.   Past Medical History  Diagnosis Date  . Thrombocytosis     Hem/Onc suggested 2/2 chronic hepatits and/or iron deficiency anemia  . Iron deficiency anemia   . N&V (nausea and vomiting)     and  Abdominal pain, chronic. Unclear ethiology with multiple admission and ED visits. Work up including Abdominal xray showed mostly nonspecific bowel gas pattern, CT abdomen with and without contrast (02/2011)  showed no acute process. Gastic Emptying scan (01/2010) was normal. Ultrasound of the abdomen was within normal limits. Hepatitis B viral load was undectable. HIV nonreactive. EGD (11/09/  . Abdominal pain   . Hypertension   . Hyperlipidemia   . Diabetes mellitus   . Recurrent boils   . Back pain   . OSA (obstructive sleep apnea)   . 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 Cath 12/10- no restenosis or significant CAD progression  . Irregular menses     Small ovarian follicles seen on CT(9/06)  . History of pyelonephritis     H/o GrpB Pyelonephritis (9/06)  and UTI- 07/11- E.Coli, 12/10- GBS  . Abscess of tunica vaginalis     10/09- Abundant S. aureus- sensitive to all abx  . Obesity   . Asthma   . Gastritis   . Depression   . Hepatitis B, chronic     Hep BeAb+,Hep B cAb+ & Hep BsAg+ (9/06)  . Peripheral neuropathy   . Fibromyalgia   . GERD (gastroesophageal reflux disease)   . Migraine   . Anxiety   . Urinary tract infection   . Gastroparesis     secondary to poorly controlled DM, last emptying study performed 01/2010  was normal but may be falsely positive as pt was on reglan  . MI (myocardial infarction) 06/15/2006  . Blood dyscrasia     Past Surgical History  Procedure Laterality Date  . Cesarean section  1997    Family History  Problem Relation Age of Onset  . Diabetes Father     History  Substance Use Topics  . Smoking status: Former Smoker    Types: Cigarettes    Quit date: 04/24/1996  . Smokeless tobacco: Never Used     Comment: quit smoking cigarettes age 22  . Alcohol Use: Yes     Comment: 03/31/11 "used marijuana last ~ 2 wk ago; only drink on occasions"    OB History   Grav Para Term Preterm Abortions  TAB SAB Ect Mult Living                  Review of Systems  Constitutional: Negative for fever, chills and fatigue.  HENT: Negative for congestion.   Respiratory: Negative for cough and shortness of breath.   Cardiovascular: Negative for chest pain.  Gastrointestinal: Positive for nausea, vomiting and abdominal pain. Negative for diarrhea, constipation and blood in stool.  Genitourinary: Negative for dysuria.  Musculoskeletal: Negative for back pain.  Skin: Negative for rash.  All other systems reviewed and are negative.   Allergies  Lisinopril  Home Medications   Current Outpatient Rx  Name  Route  Sig  Dispense  Refill  . acetaminophen (TYLENOL) 500 MG tablet   Oral   Take 1,000 mg by mouth every 6 (six) hours as needed for pain.         Marland Kitchen albuterol (PROVENTIL HFA;VENTOLIN HFA) 108 (90  BASE) MCG/ACT inhaler   Inhalation   Inhale 2 puffs into the lungs every 4 (four) hours as needed. For shortness of breath   1 Inhaler   6   . Fluticasone-Salmeterol (ADVAIR) 100-50 MCG/DOSE AEPB   Inhalation   Inhale 1 puff into the lungs every 12 (twelve) hours.   60 each   6   . insulin glargine (LANTUS) 100 UNIT/ML injection   Subcutaneous   Inject 40 Units into the skin at bedtime.         Marland Kitchen labetalol (NORMODYNE) 100 MG tablet   Oral   Take 50 mg by mouth 2 (two) times daily.         . metoCLOPramide (REGLAN) 10 MG tablet   Oral   Take 10 mg by mouth 4 (four) times daily. After meals and at bedtime         . omeprazole (PRILOSEC) 40 MG capsule   Oral   Take 1 capsule (40 mg total) by mouth daily.   31 capsule   6   . promethazine (PHENERGAN) 25 MG tablet   Oral   Take 25 mg by mouth every 6 (six) hours as needed. For nausea         . nitrofurantoin, macrocrystal-monohydrate, (MACROBID) 100 MG capsule   Oral   Take 1 capsule (100 mg total) by mouth 2 (two) times daily. X 7 days   14 capsule   0   . oxyCODONE-acetaminophen (PERCOCET) 5-325 MG per tablet   Oral   Take 1-2 tablets by mouth every 4 (four) hours as needed for pain.   15 tablet   0     BP 137/86  Pulse 115  Temp(Src) 99.1 F (37.3 C) (Oral)  Resp 20  SpO2 99%  LMP 06/19/2012  Physical Exam  Constitutional: She is oriented to person, place, and time. She appears well-developed and well-nourished. No distress.  HENT:  Head: Normocephalic and atraumatic.  Mouth/Throat: Oropharynx is clear and moist.  Eyes: Pupils are equal, round, and reactive to light.  Neck: Neck supple.  Cardiovascular: Normal rate, regular rhythm and normal heart sounds.   Pulmonary/Chest: Effort normal. She has wheezes (Faint expiratory wheeze left base). She exhibits no tenderness.  Abdominal: Soft. Bowel sounds are normal. There is tenderness (Left upper and lower quadrants, as well as suprapubic).  Prior  C-section  Musculoskeletal: Normal range of motion. She exhibits edema (Trace). She exhibits no tenderness.  Lymphadenopathy:    She has no cervical adenopathy.  Neurological: She is alert and oriented to person, place, and time.  No cranial nerve deficit. Coordination normal.  Skin: Skin is warm and dry. No rash noted.    ED Course  Procedures (including critical care time)  Labs Reviewed  CBC - Abnormal; Notable for the following:    WBC 12.1 (*)    RBC 5.19 (*)    Hemoglobin 11.3 (*)    HCT 34.3 (*)    MCV 66.1 (*)    MCH 21.8 (*)    RDW 18.0 (*)    Platelets 466 (*)    All other components within normal limits  COMPREHENSIVE METABOLIC PANEL - Abnormal; Notable for the following:    Sodium 133 (*)    Glucose, Bld 270 (*)    Total Protein 8.4 (*)    Albumin 3.3 (*)    All other components within normal limits  URINALYSIS, ROUTINE W REFLEX MICROSCOPIC - Abnormal; Notable for the following:    Specific Gravity, Urine 1.040 (*)    Glucose, UA >1000 (*)    Ketones, ur 40 (*)    All other components within normal limits  URINE RAPID DRUG SCREEN (HOSP PERFORMED) - Abnormal; Notable for the following:    Cocaine POSITIVE (*)    Tetrahydrocannabinol POSITIVE (*)    All other components within normal limits  GLUCOSE, CAPILLARY - Abnormal; Notable for the following:    Glucose-Capillary 265 (*)    All other components within normal limits  URINE MICROSCOPIC-ADD ON - Abnormal; Notable for the following:    Squamous Epithelial / LPF FEW (*)    All other components within normal limits  LIPASE, BLOOD  PREGNANCY, URINE  DIFFERENTIAL   Dg Abd Acute W/chest  06/19/2012  *RADIOLOGY REPORT*  Clinical Data: Emesis, abdominal pain  ACUTE ABDOMEN SERIES (ABDOMEN 2 VIEW & CHEST 1 VIEW)  Comparison: Chest and acute abdomen of 02/13/2012  Findings: The lungs are clear.  Mediastinal contours are stable. The heart is within normal limits in size.  No skeletal abnormality is seen.  Supine and  erect views of the abdomen show no bowel obstruction. No free air is noted.  No opaque calculi are seen.  IMPRESSION:  1.  No active lung disease. 2.  No bowel obstruction.  No free air.   Original Report Authenticated By: Dwyane Dee, M.D.     1. Nausea & vomiting     MDM  37 yo F with recurrent episodic abdominal pain with N/V. Although this appears like typical episodes for which she has had extensive work up, given the fact that she has not had any medications, will do thorough work up.   Monitor on tele, bolus IVF 1L. Will check CBC, Cmet, Lipase, CBG, UA, UPT and acute abd with chest. Given Phenergan 25mg  for nausea.   1946- Nurse reports patient continues to have nausea and pain. Will give additional dose of phenergan and Fentanyl x1. Pt's UDS is positive for cocaine and marijuana. Her recurrent symptoms could likely be contributed to her drug use. Her CBG is 265 without an anion gap so no signs of DKA, but patient should take her regular medications.   2012- Discussed drug use with patient and that cyclic vomiting can be induced by substance use. She states she only used yesterday because she was stressed, and it was not a regular occurrence for her. She declines detox, but I have strongly encouraged her to stop using all illicit drugs. Patient states she is feeling slightly better and would like to try a GI cocktail. She states she would  also try drinking fluids rather than getting a new IV. Orders placed.   2217- Pt continues to have persistent witnessed vomiting. Will start new IV site for continued fluids. Internal medicine teaching service consulted for admission. Will transfer to [facility] for admission and further evaluation. Pt agrees with this plan.  Hilarie Fredrickson, MD 06/19/12 2218

## 2012-06-19 NOTE — ED Notes (Signed)
Patient transported to X-ray 

## 2012-06-20 ENCOUNTER — Inpatient Hospital Stay (HOSPITAL_COMMUNITY): Payer: Medicaid Other

## 2012-06-20 ENCOUNTER — Encounter (HOSPITAL_COMMUNITY): Payer: Self-pay | Admitting: Internal Medicine

## 2012-06-20 DIAGNOSIS — R112 Nausea with vomiting, unspecified: Secondary | ICD-10-CM

## 2012-06-20 DIAGNOSIS — I10 Essential (primary) hypertension: Secondary | ICD-10-CM

## 2012-06-20 LAB — COMPREHENSIVE METABOLIC PANEL
AST: 12 U/L (ref 0–37)
Albumin: 3.3 g/dL — ABNORMAL LOW (ref 3.5–5.2)
Alkaline Phosphatase: 78 U/L (ref 39–117)
Chloride: 101 mEq/L (ref 96–112)
Creatinine, Ser: 0.65 mg/dL (ref 0.50–1.10)
Potassium: 3.7 mEq/L (ref 3.5–5.1)
Sodium: 136 mEq/L (ref 135–145)
Total Bilirubin: 0.4 mg/dL (ref 0.3–1.2)

## 2012-06-20 LAB — CBC
HCT: 30.6 % — ABNORMAL LOW (ref 36.0–46.0)
Hemoglobin: 10.5 g/dL — ABNORMAL LOW (ref 12.0–15.0)
MCH: 22.5 pg — ABNORMAL LOW (ref 26.0–34.0)
MCHC: 34.3 g/dL (ref 30.0–36.0)
MCV: 65.7 fL — ABNORMAL LOW (ref 78.0–100.0)
RDW: 18 % — ABNORMAL HIGH (ref 11.5–15.5)

## 2012-06-20 LAB — GLUCOSE, CAPILLARY
Glucose-Capillary: 265 mg/dL — ABNORMAL HIGH (ref 70–99)
Glucose-Capillary: 184 mg/dL — ABNORMAL HIGH (ref 70–99)

## 2012-06-20 LAB — LACTIC ACID, PLASMA: Lactic Acid, Venous: 1.2 mmol/L (ref 0.5–2.2)

## 2012-06-20 MED ORDER — MORPHINE SULFATE 2 MG/ML IJ SOLN
2.0000 mg | INTRAMUSCULAR | Status: DC | PRN
  Administered 2012-06-20 (×2): 2 mg via INTRAVENOUS
  Filled 2012-06-20: qty 1

## 2012-06-20 MED ORDER — INSULIN ASPART 100 UNIT/ML ~~LOC~~ SOLN
0.0000 [IU] | SUBCUTANEOUS | Status: DC
Start: 2012-06-20 — End: 2012-06-21
  Administered 2012-06-20: 8 [IU] via SUBCUTANEOUS
  Administered 2012-06-20 (×2): 3 [IU] via SUBCUTANEOUS
  Administered 2012-06-20: 8 [IU] via SUBCUTANEOUS

## 2012-06-20 MED ORDER — METOCLOPRAMIDE HCL 5 MG/ML IJ SOLN
10.0000 mg | Freq: Three times a day (TID) | INTRAMUSCULAR | Status: DC
  Administered 2012-06-20 (×2): 10 mg via INTRAVENOUS
  Filled 2012-06-20 (×4): qty 2

## 2012-06-20 MED ORDER — LABETALOL HCL 5 MG/ML IV SOLN
10.0000 mg | Freq: Four times a day (QID) | INTRAVENOUS | Status: DC
  Administered 2012-06-20 – 2012-06-21 (×4): 10 mg via INTRAVENOUS
  Filled 2012-06-20 (×10): qty 4

## 2012-06-20 MED ORDER — KETOROLAC TROMETHAMINE 30 MG/ML IJ SOLN
INTRAMUSCULAR | Status: AC
  Administered 2012-06-20: 30 mg
  Filled 2012-06-20: qty 1

## 2012-06-20 MED ORDER — MORPHINE SULFATE 2 MG/ML IJ SOLN: 2.0000 mg | INTRAMUSCULAR | Status: DC | PRN

## 2012-06-20 MED ORDER — HYDROMORPHONE HCL PF 1 MG/ML IJ SOLN
INTRAMUSCULAR | Status: AC
  Filled 2012-06-20: qty 1

## 2012-06-20 MED ORDER — ALBUTEROL SULFATE HFA 108 (90 BASE) MCG/ACT IN AERS
2.0000 | INHALATION_SPRAY | RESPIRATORY_TRACT | Status: DC | PRN
  Filled 2012-06-20: qty 6.7

## 2012-06-20 MED ORDER — MORPHINE SULFATE 2 MG/ML IJ SOLN
2.0000 mg | Freq: Four times a day (QID) | INTRAMUSCULAR | Status: DC | PRN
  Administered 2012-06-20 – 2012-06-21 (×2): 2 mg via INTRAVENOUS
  Filled 2012-06-20 (×2): qty 1

## 2012-06-20 MED ORDER — HYDROMORPHONE HCL PF 1 MG/ML IJ SOLN: 0.5000 mg | Freq: Four times a day (QID) | INTRAMUSCULAR | Status: DC | PRN

## 2012-06-20 MED ORDER — SODIUM CHLORIDE 0.9 % IV SOLN
INTRAVENOUS | Status: DC
  Administered 2012-06-20 – 2012-06-21 (×4): via INTRAVENOUS

## 2012-06-20 MED ORDER — INSULIN GLARGINE 100 UNIT/ML ~~LOC~~ SOLN
20.0000 [IU] | Freq: Every day | SUBCUTANEOUS | Status: DC
  Administered 2012-06-20 (×2): 20 [IU] via SUBCUTANEOUS

## 2012-06-20 MED ORDER — MORPHINE SULFATE 2 MG/ML IJ SOLN
INTRAMUSCULAR | Status: AC
  Filled 2012-06-20: qty 1

## 2012-06-20 MED ORDER — PANTOPRAZOLE SODIUM 40 MG IV SOLR
40.0000 mg | INTRAVENOUS | Status: DC
  Administered 2012-06-20 – 2012-06-21 (×2): 40 mg via INTRAVENOUS
  Filled 2012-06-20 (×3): qty 40

## 2012-06-20 MED ORDER — IOHEXOL 300 MG/ML  SOLN
75.0000 mL | Freq: Once | INTRAMUSCULAR | Status: AC | PRN
  Administered 2012-06-20: 75 mL via INTRAVENOUS

## 2012-06-20 MED ORDER — ENOXAPARIN SODIUM 40 MG/0.4ML ~~LOC~~ SOLN
40.0000 mg | SUBCUTANEOUS | Status: DC
  Administered 2012-06-20: 40 mg via SUBCUTANEOUS
  Filled 2012-06-20 (×2): qty 0.4

## 2012-06-20 MED ORDER — KETOROLAC TROMETHAMINE 30 MG/ML IJ SOLN
30.0000 mg | Freq: Four times a day (QID) | INTRAMUSCULAR | Status: DC | PRN
  Administered 2012-06-20: 30 mg via INTRAVENOUS

## 2012-06-20 MED ORDER — PROMETHAZINE HCL 25 MG/ML IJ SOLN
12.5000 mg | Freq: Four times a day (QID) | INTRAMUSCULAR | Status: DC | PRN
  Administered 2012-06-20 – 2012-06-21 (×2): 12.5 mg via INTRAVENOUS
  Filled 2012-06-20 (×2): qty 1

## 2012-06-20 MED ORDER — MOMETASONE FURO-FORMOTEROL FUM 100-5 MCG/ACT IN AERO
2.0000 | INHALATION_SPRAY | Freq: Two times a day (BID) | RESPIRATORY_TRACT | Status: DC
  Administered 2012-06-20: 2 via RESPIRATORY_TRACT
  Filled 2012-06-20: qty 8.8

## 2012-06-20 MED ORDER — METOCLOPRAMIDE HCL 10 MG PO TABS: 10.0000 mg | ORAL_TABLET | Freq: Four times a day (QID) | ORAL | Status: DC

## 2012-06-20 NOTE — H&P (Signed)
Patient seen and examined, case reviewed with resident team.  Patient with chronic abdominal pain that seems c/w gastroparesis, despite negative emptying study.  She reports no insulin due to cost but does have access with insurance.  Also cocaine positive.  Emphasized need to not use cocaine and obtain insulin.  She may benefit from 70/30 in place of Lantus if copay is high.

## 2012-06-20 NOTE — Progress Notes (Signed)
UR COMPLETED  

## 2012-06-20 NOTE — Telephone Encounter (Signed)
This patient was hospitalized today on the inpatient service.  Will forward to the inpatient team.

## 2012-06-20 NOTE — H&P (Signed)
Date: 06/20/2012               Patient Name:  Shirley [name] MRN: [id number]  DOB: 1975-11-24 Age / Sex: 37 y.o., female   PCP: Kristie Cowman              Medical Service: Internal Medicine Teaching Service              Attending Physician: Dr. Gardiner Barefoot, MD    First Contact: Dr. Darden Palmer Pager: [phone]  Second Contact: Dr. Dede Query Pager: [phone]            After Hours (After 5p/  First Contact Pager: [phone]  weekends / holidays): Second Contact Pager: [phone]     Chief Complaint: Abdominal pain, nausea, vomiting  History of Present Illness: Patient is a 37 y.o. female with a PMHx of uncontrolled DM2, chronic episodic N/V/Abd pain thought likely DM gastroparesis, polysubstance abuse (cocaine, marijuana), HTN who is admitted to [facility] on 06/20/2012 on transfer from [facility] for 1 day history of acute on chronic LLQ abdominal pain with associated nausea and vomiting that started at approximately 3PM. The patient indicates associated vomiting of clear emesis without hematemesis. Has had sharp, shooting pains in LLQ and LUQ.  No associated constipation, blood in stool, dysuria, hematuria, fevers, chills, shortness of breath.  The patient notably has not filled any of her medications, including her insulin and reglan, in > 1 month says she is unable to afford it. Additionally, the patient was previously seen by Dr. Elnoria Howard, [facility], in October 2013 at which time an EGD and colonoscopy were planned, however patient was lost to followup.  Review of Systems: Constitutional:  denies fever, chills, diaphoresis, appetite change and fatigue.  HEENT: denies eye pain, redness, hearing loss, ear pain, congestion, sore throat, rhinorrhea.  Respiratory: denies SOB, DOE, cough, chest tightness, and wheezing.  Cardiovascular: denies chest pain, palpitations and leg swelling.  Gastrointestinal: admits to nausea, vomiting, abdominal pain. Denies diarrhea, constipation, blood in stool.    Genitourinary: denies dysuria, urgency, frequency, hematuria, flank pain and difficulty urinating.  Musculoskeletal: denies myalgias, back pain, joint swelling, arthralgias and gait problem.   Skin: denies pallor, rash and wound.  Neurological: denies dizziness, seizures, syncope, weakness, light-headedness, numbness and headaches.   Hematological: denies easy bruising, personal or family bleeding history.    Current Outpatient Medications: Has been out of all medications x 1 month secondary to financial constraints. Medication Sig  . acetaminophen (TYLENOL) 500 MG tablet Take 1,000 mg by mouth every 6 (six) hours as needed for pain.  Marland Kitchen albuterol (PROVENTIL HFA;VENTOLIN HFA) 108 (90 BASE) MCG/ACT inhaler Inhale 2 puffs into the lungs every 4 (four) hours as needed. For shortness of breath  . Fluticasone-Salmeterol (ADVAIR) 100-50 MCG/DOSE AEPB Inhale 1 puff into the lungs every 12 (twelve) hours.  . insulin glargine (LANTUS) 100 UNIT/ML injection Inject 40 Units into the skin at bedtime.  Marland Kitchen labetalol (NORMODYNE) 100 MG tablet Take 50 mg by mouth 2 (two) times daily.  . metoCLOPramide (REGLAN) 10 MG tablet Take 10 mg by mouth 4 (four) times daily. After meals and at bedtime  . omeprazole (PRILOSEC) 40 MG capsule Take 1 capsule (40 mg total) by mouth daily.  . promethazine (PHENERGAN) 25 MG tablet Take 25 mg by mouth every 6 (six) hours as needed. For nausea  . oxyCODONE-acetaminophen (PERCOCET) 5-325 MG per tablet Take 1-2 tablets by mouth every 4 (four) hours as needed for pain.  Allergies: Allergies  Allergen Reactions  . Lisinopril Nausea Only     Past Medical History: Past Medical History  Diagnosis Date  . Thrombocytosis     Hem/Onc suggested 2/2 chronic hepatits and/or iron deficiency anemia  . Iron deficiency anemia   . N&V (nausea and vomiting)     and  Abdominal pain, chronic. Unclear ethiology with multiple admission and ED visits. Work up including Abdominal xray  showed mostly nonspecific bowel gas pattern, CT abdomen with and without contrast (02/2011)  showed no acute process. Gastic Emptying scan (01/2010) was normal. Ultrasound of the abdomen was within normal limits. Hepatitis B viral load was undectable. HIV nonreactive. EGD (11/09/  . Abdominal pain   . Hypertension   . Hyperlipidemia   . Diabetes mellitus type 2, uncontrolled, with complications   . Recurrent boils   . Back pain   . OSA (obstructive sleep apnea)   . 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 Cath 12/10- no restenosis or significant CAD progression  . Irregular menses     Small ovarian follicles seen on CT(9/06)  . History of pyelonephritis     H/o GrpB Pyelonephritis (9/06) and UTI- 07/11- E.Coli, 12/10- GBS  . Abscess of tunica vaginalis     10/09- Abundant S. aureus- sensitive to all abx  . Obesity   . Asthma   . Gastritis   . Depression   . Hepatitis B, chronic     Hep BeAb+,Hep B cAb+ & Hep BsAg+ (9/06)  . Peripheral neuropathy   . Fibromyalgia   . GERD (gastroesophageal reflux disease)   . Migraine   . Anxiety   . Gastroparesis     secondary to poorly controlled DM, last emptying study performed 01/2010  was normal but may be falsely positive as pt was on reglan  . Blood dyscrasia     Past Surgical History: Past Surgical History  Procedure Laterality Date  . Cesarean section  1997    Family History: Family History  Problem Relation Age of Onset  . Diabetes Father     Social History: History   Social History  . Marital Status: Single    Spouse Name: N/A    Number of Children: N/A  . Years of Education: N/A   Occupational History  . other     unemployed   Social History Main Topics  . Smoking status: Former Smoker    Types: Cigarettes    Quit date: 04/24/1996  . Smokeless tobacco: Never Used     Comment: quit smoking  cigarettes age 19  . Alcohol Use: Yes     Comment: 03/31/11 "used marijuana last ~ 2 wk ago; only drink on occasions"  . Drug Use: Yes    Special: Marijuana  . Sexually Active: Yes   Other Topics Concern  . 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          Vital Signs: Blood pressure 159/106, pulse 106, temperature 98.8 F (37.1 C), temperature source Oral, resp. rate 18, last menstrual period 06/19/2012, SpO2 100.00%.  Physical Exam: General: Vital signs reviewed and noted. Female lying in bed, falling asleep frequently during evaluation. When she awakens, begins shaking her entire body and  moaning. Oriented x3 when awoken.  Head: Normocephalic, atraumatic.  Neck: No deformities, masses, or tenderness noted.Supple, No carotid Bruits, no JVD.  Lungs:  Normal respiratory effort. Clear to auscultation BL without crackles or wheezes.  Heart: Tachycardic to 110s, rhythm regular. S1 and S2 normal without gallop, murmur, or rubs.  Abdomen:  TTP of LLQ and LUQ. Some voluntary guarding, no rebound. Normoactive BS.  Extremities: No pretibial edema.  Neurologic: CN II - XII are grossly intact. Motor strength is 5/5 in the all 4 extremities, Sensations intact to light touch upper and lower extremities  Skin: No visible rashes    Lab results: Comprehensive Metabolic Panel:    Component Value Date/Time   NA 133* 06/19/2012 1710   K 4.0 06/19/2012 1710   CL 99 06/19/2012 1710   CO2 24 06/19/2012 1710   BUN 8 06/19/2012 1710   CREATININE 0.67 06/19/2012 1710   CREATININE 0.63 03/30/2011 1645   GLUCOSE 270* 06/19/2012 1710   CALCIUM 9.2 06/19/2012 1710   AST 21 06/19/2012 1710   ALT 5 06/19/2012 1710   ALKPHOS 78 06/19/2012 1710   BILITOT 0.4 06/19/2012 1710   PROT 8.4* 06/19/2012 1710   ALBUMIN 3.3* 06/19/2012 1710    CBC:    Component Value  Date/Time   WBC 12.1* 06/19/2012 1710   HGB 11.3* 06/19/2012 1710   HCT 34.3* 06/19/2012 1710   PLT 466* 06/19/2012 1710   MCV 66.1* 06/19/2012 1710   NEUTROABS 8.3* 06/19/2012 1710   LYMPHSABS 2.8 06/19/2012 1710   MONOABS 0.8 06/19/2012 1710   EOSABS 0.3 06/19/2012 1710   BASOSABS 0.0 06/19/2012 1710    Urinalysis:   Recent Labs  06/19/12 1849  COLORURINE YELLOW  LABSPEC 1.040*  PHURINE 7.0  GLUCOSEU >1000*  HGBUR NEGATIVE  BILIRUBINUR NEGATIVE  KETONESUR 40*  PROTEINUR NEGATIVE  UROBILINOGEN 1.0  NITRITE NEGATIVE  LEUKOCYTESUR NEGATIVE    Drugs of Abuse     Component Value Date/Time   LABOPIA NONE DETECTED 06/19/2012 1849   LABOPIA NEGATIVE 02/21/2010 2201   COCAINSCRNUR POSITIVE* 06/19/2012 1849   COCAINSCRNUR NEGATIVE 02/21/2010 2201   LABBENZ NONE DETECTED 06/19/2012 1849   LABBENZ NEGATIVE 02/21/2010 2201   AMPHETMU NONE DETECTED 06/19/2012 1849   AMPHETMU NEGATIVE 02/21/2010 2201   THCU POSITIVE* 06/19/2012 1849   LABBARB NONE DETECTED 06/19/2012 1849    Historical Labs: Lab Results  Component Value Date   HGBA1C >14.0 01/22/2012    Lab Results  Component Value Date   CHOL 204* 03/04/2011   HDL 50 03/04/2011   LDLCALC 126* 03/04/2011   TRIG 140 03/04/2011   CHOLHDL 4.1 03/04/2011    Lab Results  Component Value Date   TSH 0.934 01/26/2010    Imaging results:   Dg Abd Acute W/chest (06/19/2012) - 1.  No active lung disease. 2.  No bowel obstruction.  No free air.   Original Report Authenticated By: Dwyane Dee, M.D.    Assessment & Plan:  Pt is a 37 y.o. yo female with a PMHx of uncontrolled DM2, chronic abdominal pain with intermittent severe N/V thought likely 2/2 diabetic gastroparesis who was admitted on 06/19/2012 with symptoms of N/V/abd pain, thought to be acute exacerbation of her chronic pain.    1) Acute on chronic abdominal pain with nausea/vomiting - likely precipitated by diabetic gastroparesis and noncompliance with her home Reglan. This has  been a chronic and ongoing issue for the patient that spans several years and innumerable ED visits (previously up to 7 x/  month). She last followed up with her GI physician, Dr. Elnoria Howard, in October 2013 who noted a greater than 200 pound weight loss as a result of her symptoms (approximately 130 lbs weight loss since 05/2007 noted per our system).  Last EGD 2011: mild gastritis, H pylori + requiring 4 Rxs with appropriate eradication.  Gastric emptying study 2011: Was normal, but possibly false negative in setting of reglan use She has also had CT abdomen & US done in November 2012 showing no evidence of acute pathology except a left sided corpus luteal cyst, that should not cause the extent of pain that she reports.  Depression/ anxiety/ somatization thought to likely play a role. Also suspect depression/anxiety plays a component in patient's N/V.  Dr. Elnoria Howard planned to do EGD/colonoscopy, but patient lost to follow-up. - Reglan 10mg  IV TID - Phenergan prn - morphine overnight for pain - this will exacerbate her sx if continued, but due to acute intolerable pain and inability to tolerate po, will provide 2mg  IV q4 for 8 hours - NPO - IVF NS 150 cc/hr - ? GI consult while inpatient [facility] [phone])  2) Chronic microcytic anemia -  BL Hgb is 9-11. Admission Hgb 11.3 (likely hemoconcentrated). 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 Chart review suggests Hemoglobin C, and electrophoresis in 2009, but no documentation of electrophoresis itself. - Continue to monitor for evidence of bleed.  - No bleeding evident at this time, but will need continued evalaution in the setting of her weight loss.   3) DMII, uncontrolled with multiple complications - last A1c > 14 (12/2011) in setting of ongoing and consistent medication noncompliance. Has  not had insulin x > 1 month.  Blood glucose 270 in ED.  - Reduced dose Lantus at 20 units - SSI, moderate.   4) Hypertension -  BP remains elevated mmHg - likely 2/2 medication noncompliance, cocaine abuse, and pain. - Continued to stress importance of cocaine cessation.  - IV labetalol.  5) Polysubstance abuse - cocaine and marijuana use.  - Historically has refused social work/ psych help with cessation.   6) Depression/Anxiety - Pt historically resistant to discussion (inpatient and outpatient) and initiation of antidepressant although seems clearly depressed.  - Will need further discussion as an outpatient.  7) Volume depletion - dehydration in setting of Gi volume losses. Likely cause of her tachycardia.  - IVF with NS @ 150cc/hr  8) Leukocytosis Modestly increased, 12.1. Presents with frequent mild elevations. Suspect 2/2 stress demargination. No fever/chills. - follow CBCs  9) CAD S/p Subendocardial MI with PDA angioplasty(no stent) on 06/15/06 and relook cath 06/19/06 showed patency of site. Cath 12/10- no restenosis or significant CAD progression. Has not been on any meds at home for past 1 month, supposed to be taking labetalol 50po BID. - labetalol IV until patient can tolerate npo   DVT PPX - low molecular weight heparin  CODE STATUS - FULL  CONSULTS PLACED - None  DISPO - Disposition is deferred at this time, awaiting improvement of N/V.   Anticipated discharge in approximately 1-2 day(s).   The patient does have a current PCP (SCHOOLER, KAREN, MD) and will be requiring OPC follow-up after discharge.  Signed: Bronson Curb, MD  PGY-1, Internal Medicine Resident Pager: [phone]) 06/20/2012, 2:32 AM

## 2012-06-20 NOTE — Telephone Encounter (Signed)
This says medication reaction as reason for call?? What kind of reaction??

## 2012-06-20 NOTE — Progress Notes (Signed)
2/27  Noted that patient has not been filling prescription for Lantus insulin in the last month.  Suggest changing her insulin to 70/30 that she can purchase for $25 per bottle at [facility].  History of taking Lantus 40 units daily would be equal to 70/30 30-35 units twice a day.  Could start patient on this while in hospital to be able to titrate dosage.  Could start with 25 units of 70/30 twice a day and titrate up as needed. Patient can also purchase [facility] home blood glucose meter and strips for less cost compared to other meters and strips.  Added a consult for care management to review with patient her medication needs and home needs.   Smith Mince RN BSN CDE

## 2012-06-20 NOTE — ED Notes (Signed)
[facility] called for transport. 

## 2012-06-20 NOTE — Telephone Encounter (Signed)
Dr. Virgina Organ, Dr. Everardo Beals are primary team. Will make them aware.

## 2012-06-20 NOTE — Progress Notes (Signed)
CARE MANAGEMENT NOTE 06/20/2012  Patient:  Shirley [name], Shirley [name]   Account Number:  [id number]  Date Initiated:  06/20/2012  Documentation initiated by:  Vance Peper  Subjective/Objective Assessment:   37 yr old female adm for gastroparesis.  Hasnt taken meds in a month.     Action/Plan:   Anticipated DC Date:     Anticipated DC Plan:  HOME/SELF CARE      DC Planning Services  CM consult  Medication Assistance      [facility] Choice  NA   Choice offered to / List presented to:             Status of service:  In process, will continue to follow Medicare Important Message given?   (If response is "NO", the following Medicare IM given date fields will be blank) Date Medicare IM given:   Date Additional Medicare IM given:    Discharge Disposition:    Per UR Regulation:    If discussed at Long Length of Stay Meetings, dates discussed:    Comments:  06/20/12 1:41pm Vance Peper, RN BSN Case Manager CM provided patient with [employer] application for Lantus. Will seek other resources

## 2012-06-20 NOTE — Telephone Encounter (Signed)
No response from Dr Bosie Clos, please evaluate for refill.

## 2012-06-20 NOTE — Consult Note (Signed)
Reason for Consult:Nausea/Vomiting Referring Physician: Teaching Service  Shirley [name] HPI: This is a 37 year old female who is admitted for nausea, vomiting, and abdominal pain.  In 2011 she underwent a work up for her symptoms and the EGD as well as the gastric emptying scan were negative. At that time it was felt that she had symptoms consistent with gastroparesis and she was treated with medications.  Since that time she has had many episodes of ER admissions for the same type of complaint.  The patient was evaluated in my office in 01/2012 for the same complains, but she now started to have severe weight loss.  She dropped her weight from 430 lbs down to 214 lbs, and she is currently stable at this weight.  Her last A1C last year was at 14% and she continues to abuse cocaine and marijuana.  As a result of her symptoms a GI consultation was requested.  Past Medical History  Diagnosis Date  . Thrombocytosis     Hem/Onc suggested 2/2 chronic hepatits and/or iron deficiency anemia  . Iron deficiency anemia   . N&V (nausea and vomiting)     Chronic. Unclear ethiology with multiple admission and ED visits. CT abdomen with and without contrast (02/2011)  showed no acute process. Gastic Emptying scan (01/2010) was normal. Ultrasound of the abdomen was within normal limits. Hepatitis B viral load was undectable. HIV NR. EGD - gastritis, Hpylori + s/p Rx  . Abdominal pain   . Hypertension   . Hyperlipidemia   . Diabetes mellitus type 2, uncontrolled, with complications   . Recurrent boils   . Back pain   . OSA (obstructive sleep apnea)   . 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 Cath 12/10- no restenosis or significant CAD progression  . Irregular menses     Small ovarian follicles seen on CT(9/06)  . History of pyelonephritis     H/o GrpB Pyelonephritis (9/06) and UTI- 07/11-  E.Coli, 12/10- GBS  . Abscess of tunica vaginalis     10/09- Abundant S. aureus- sensitive to all abx  . Obesity   . Asthma   . Gastritis   . Depression   . Hepatitis B, chronic     Hep BeAb+,Hep B cAb+ & Hep BsAg+ (9/06)  . Peripheral neuropathy   . Fibromyalgia   . GERD (gastroesophageal reflux disease)   . Migraine   . Anxiety   . Gastroparesis     secondary to poorly controlled DM, last emptying study performed 01/2010  was normal but may be falsely positive as pt was on reglan  . Blood dyscrasia     Past Surgical History  Procedure Laterality Date  . Cesarean section  1997    Family History  Problem Relation Age of Onset  . Diabetes Father     Social History:  reports that she quit smoking about 16 years ago. Her smoking use included Cigarettes. She smoked 0.00 packs per day. She has never used smokeless tobacco. She reports that  drinks alcohol. She reports that she uses illicit drugs (Marijuana).  Allergies:  Allergies  Allergen Reactions  . Lisinopril Nausea Only    Medications:  Scheduled: . enoxaparin (LOVENOX) injection  40 mg Subcutaneous Q24H  . insulin aspart  0-15 Units Subcutaneous Q4H  . insulin glargine  20 Units Subcutaneous QHS  . labetalol  10 mg Intravenous Q6H  .  metoCLOPramide (REGLAN) injection  10 mg Intravenous Q8H  . mometasone-formoterol  2 puff Inhalation BID  . pantoprazole (PROTONIX) IV  40 mg Intravenous Q24H   Continuous: . sodium chloride 150 mL/hr at 06/20/12 0913    Results for orders placed during the hospital encounter of 06/19/12 (from the past 24 hour(s))  GLUCOSE, CAPILLARY     Status: Abnormal   Collection Time    06/19/12  4:59 PM      Result Value Range   Glucose-Capillary 265 (*) 70 - 99 mg/dL  CBC     Status: Abnormal   Collection Time    06/19/12  5:10 PM      Result Value Range   WBC 12.1 (*) 4.0 - 10.5 K/uL   RBC 5.19 (*) 3.87 - 5.11 MIL/uL   Hemoglobin 11.3 (*) 12.0 - 15.0 g/dL   HCT 40.9 (*) 81.1 -  46.0 %   MCV 66.1 (*) 78.0 - 100.0 fL   MCH 21.8 (*) 26.0 - 34.0 pg   MCHC 32.9  30.0 - 36.0 g/dL   RDW 91.4 (*) 78.2 - 95.6 %   Platelets 466 (*) 150 - 400 K/uL  COMPREHENSIVE METABOLIC PANEL     Status: Abnormal   Collection Time    06/19/12  5:10 PM      Result Value Range   Sodium 133 (*) 135 - 145 mEq/L   Potassium 4.0  3.5 - 5.1 mEq/L   Chloride 99  96 - 112 mEq/L   CO2 24  19 - 32 mEq/L   Glucose, Bld 270 (*) 70 - 99 mg/dL   BUN 8  6 - 23 mg/dL   Creatinine, Ser 2.13  0.50 - 1.10 mg/dL   Calcium 9.2  8.4 - 08.6 mg/dL   Total Protein 8.4 (*) 6.0 - 8.3 g/dL   Albumin 3.3 (*) 3.5 - 5.2 g/dL   AST 21  0 - 37 U/L   ALT 5  0 - 35 U/L   Alkaline Phosphatase 78  39 - 117 U/L   Total Bilirubin 0.4  0.3 - 1.2 mg/dL   GFR calc non Af Amer >90  >90 mL/min   GFR calc Af Amer >90  >90 mL/min  LIPASE, BLOOD     Status: None   Collection Time    06/19/12  5:10 PM      Result Value Range   Lipase 27  11 - 59 U/L  DIFFERENTIAL     Status: Abnormal   Collection Time    06/19/12  5:10 PM      Result Value Range   Neutrophils Relative 68  43 - 77 %   Neutro Abs 8.3 (*) 1.7 - 7.7 K/uL   Lymphocytes Relative 23  12 - 46 %   Lymphs Abs 2.8  0.7 - 4.0 K/uL   Monocytes Relative 6  3 - 12 %   Monocytes Absolute 0.8  0.1 - 1.0 K/uL   Eosinophils Relative 3  0 - 5 %   Eosinophils Absolute 0.3  0.0 - 0.7 K/uL   Basophils Relative 0  0 - 1 %   Basophils Absolute 0.0  0.0 - 0.1 K/uL  URINALYSIS, ROUTINE W REFLEX MICROSCOPIC     Status: Abnormal   Collection Time    06/19/12  6:49 PM      Result Value Range   Color, Urine YELLOW  YELLOW   APPearance CLEAR  CLEAR   Specific Gravity, Urine 1.040 (*) 1.005 - 1.030  pH 7.0  5.0 - 8.0   Glucose, UA >1000 (*) NEGATIVE mg/dL   Hgb urine dipstick NEGATIVE  NEGATIVE   Bilirubin Urine NEGATIVE  NEGATIVE   Ketones, ur 40 (*) NEGATIVE mg/dL   Protein, ur NEGATIVE  NEGATIVE mg/dL   Urobilinogen, UA 1.0  0.0 - 1.0 mg/dL   Nitrite NEGATIVE   NEGATIVE   Leukocytes, UA NEGATIVE  NEGATIVE  PREGNANCY, URINE     Status: None   Collection Time    06/19/12  6:49 PM      Result Value Range   Preg Test, Ur NEGATIVE  NEGATIVE  URINE RAPID DRUG SCREEN (HOSP PERFORMED)     Status: Abnormal   Collection Time    06/19/12  6:49 PM      Result Value Range   Opiates NONE DETECTED  NONE DETECTED   Cocaine POSITIVE (*) NONE DETECTED   Benzodiazepines NONE DETECTED  NONE DETECTED   Amphetamines NONE DETECTED  NONE DETECTED   Tetrahydrocannabinol POSITIVE (*) NONE DETECTED   Barbiturates NONE DETECTED  NONE DETECTED  URINE MICROSCOPIC-ADD ON     Status: Abnormal   Collection Time    06/19/12  6:49 PM      Result Value Range   Squamous Epithelial / LPF FEW (*) RARE  GLUCOSE, CAPILLARY     Status: Abnormal   Collection Time    06/20/12  2:34 AM      Result Value Range   Glucose-Capillary 257 (*) 70 - 99 mg/dL  GLUCOSE, CAPILLARY     Status: Abnormal   Collection Time    06/20/12  5:50 AM      Result Value Range   Glucose-Capillary 265 (*) 70 - 99 mg/dL  GLUCOSE, CAPILLARY     Status: Abnormal   Collection Time    06/20/12 12:35 PM      Result Value Range   Glucose-Capillary 194 (*) 70 - 99 mg/dL  COMPREHENSIVE METABOLIC PANEL     Status: Abnormal   Collection Time    06/20/12  1:57 PM      Result Value Range   Sodium 136  135 - 145 mEq/L   Potassium 3.7  3.5 - 5.1 mEq/L   Chloride 101  96 - 112 mEq/L   CO2 20  19 - 32 mEq/L   Glucose, Bld 206 (*) 70 - 99 mg/dL   BUN 10  6 - 23 mg/dL   Creatinine, Ser 1.61  0.50 - 1.10 mg/dL   Calcium 9.3  8.4 - 09.6 mg/dL   Total Protein 8.7 (*) 6.0 - 8.3 g/dL   Albumin 3.3 (*) 3.5 - 5.2 g/dL   AST 12  0 - 37 U/L   ALT 5  0 - 35 U/L   Alkaline Phosphatase 78  39 - 117 U/L   Total Bilirubin 0.4  0.3 - 1.2 mg/dL   GFR calc non Af Amer >90  >90 mL/min   GFR calc Af Amer >90  >90 mL/min     Dg Abd Acute W/chest  06/19/2012  *RADIOLOGY REPORT*  Clinical Data: Emesis, abdominal pain  ACUTE  ABDOMEN SERIES (ABDOMEN 2 VIEW & CHEST 1 VIEW)  Comparison: Chest and acute abdomen of 02/13/2012  Findings: The lungs are clear.  Mediastinal contours are stable. The heart is within normal limits in size.  No skeletal abnormality is seen.  Supine and erect views of the abdomen show no bowel obstruction. No free air is noted.  No opaque calculi are seen.  IMPRESSION:  1.  No active lung disease. 2.  No bowel obstruction.  No free air.   Original Report Authenticated By: Dwyane Dee, M.D.     ROS:  As stated above in the HPI otherwise negative.  Blood pressure 176/108, pulse 119, temperature 99.1 F (37.3 C), temperature source Oral, resp. rate 18, height 5\' 6"  (1.676 m), weight 214 lb 8.1 oz (97.3 kg), last menstrual period 06/19/2012, SpO2 98.00%.    PE: Gen: Sleeping, arousable, will not answer questions Lungs: CTA Bilaterally CV: RRR ABM: Soft, morbidly obese Ext: No C/C/E  Assessment/Plan: 1) Nausea/Vomiting/ABM pain. 2) Poorly controlled diabetes. 3) Polysubstance abuse.   Clinically it appears that the patient has gastroparesis, but are no tests to support this view.  I think it is worthwhile to repeat the gastric emptying scan.  Also a head CT scan needs to be considered with chronic nausea and vomiting to rule out any cerebral source.  Plan: 1) Gastric emptying scan.  Optimally it would be best to wait 48 hours off of any motility agents. 2) Control of blood sugars. 3) CT scan of the head.  Shirley [name] D 06/20/2012, 3:07 PM

## 2012-06-21 ENCOUNTER — Encounter (HOSPITAL_COMMUNITY): Payer: Self-pay | Admitting: Internal Medicine

## 2012-06-21 ENCOUNTER — Inpatient Hospital Stay (HOSPITAL_COMMUNITY): Payer: Medicaid Other

## 2012-06-21 DIAGNOSIS — D72829 Elevated white blood cell count, unspecified: Secondary | ICD-10-CM

## 2012-06-21 LAB — CBC
Hemoglobin: 9.9 g/dL — ABNORMAL LOW (ref 12.0–15.0)
MCH: 22 pg — ABNORMAL LOW (ref 26.0–34.0)
MCHC: 33.1 g/dL (ref 30.0–36.0)
RDW: 18.5 % — ABNORMAL HIGH (ref 11.5–15.5)

## 2012-06-21 LAB — BASIC METABOLIC PANEL
BUN: 12 mg/dL (ref 6–23)
Creatinine, Ser: 0.68 mg/dL (ref 0.50–1.10)
GFR calc non Af Amer: >90 mL/min (ref >90)
Glucose, Bld: 122 mg/dL — ABNORMAL HIGH (ref 70–99)
Potassium: 3.3 mEq/L — ABNORMAL LOW (ref 3.5–5.1)

## 2012-06-21 LAB — GLUCOSE, CAPILLARY
Glucose-Capillary: 255 mg/dL — ABNORMAL HIGH (ref 70–99)
Glucose-Capillary: 185 mg/dL — ABNORMAL HIGH (ref 70–99)

## 2012-06-21 LAB — PHOSPHORUS: Phosphorus: 2.3 mg/dL (ref 2.3–4.6)

## 2012-06-21 MED ORDER — POTASSIUM CHLORIDE 10 MEQ/100ML IV SOLN
10.0000 meq | INTRAVENOUS | Status: AC
  Administered 2012-06-21: 10 meq via INTRAVENOUS
  Filled 2012-06-21 (×4): qty 100

## 2012-06-21 MED ORDER — INSULIN GLARGINE 100 UNIT/ML ~~LOC~~ SOLN: 20.0000 [IU] | Freq: Every day | SUBCUTANEOUS | Status: DC

## 2012-06-21 MED ORDER — LABETALOL HCL 100 MG PO TABS: 50.0000 mg | ORAL_TABLET | Freq: Two times a day (BID) | ORAL | Status: DC

## 2012-06-21 MED ORDER — INSULIN ASPART 100 UNIT/ML ~~LOC~~ SOLN
0.0000 [IU] | Freq: Three times a day (TID) | SUBCUTANEOUS | Status: DC
  Administered 2012-06-21: 5 [IU] via SUBCUTANEOUS
  Administered 2012-06-21: 2 [IU] via SUBCUTANEOUS

## 2012-06-21 MED ORDER — PROMETHAZINE HCL 25 MG PO TABS: 25.0000 mg | ORAL_TABLET | Freq: Four times a day (QID) | ORAL | Status: DC | PRN

## 2012-06-21 NOTE — Progress Notes (Signed)
Patient is telling RN that she is nauseated, but has not vomited. Patient ate lunch, and was nauseated before eating, but the nausea remained the same after eating.  Patient had a BM around 1130 am on 06/21/2012

## 2012-06-21 NOTE — Progress Notes (Addendum)
Subjective: Still with nausea and vomiting last evening.  Objective: Vital signs in last 24 hours: Temp:  [97.9 F (36.6 C)-99.1 F (37.3 C)] 97.9 F (36.6 C) (02/28 0600) Pulse Rate:  [95-119] 95 (02/28 0600) Resp:  [18] 18 (02/28 0600) BP: (111-176)/(67-108) 137/90 mmHg (02/28 0600) SpO2:  [100 %] 100 % (02/28 0600) Weight:  [214 lb 4.6 oz (97.2 kg)] 214 lb 4.6 oz (97.2 kg) (02/28 0600) Last BM Date: 06/19/12  Intake/Output from previous day:   Intake/Output this shift:    General appearance: no distress GI: soft, non-tender; bowel sounds normal; no masses,  no organomegaly  Lab Results:  Recent Labs  06/19/12 1710 06/20/12 1553 06/21/12 0525  WBC 12.1* 16.6* 13.2*  HGB 11.3* 10.5* 9.9*  HCT 34.3* 30.6* 29.9*  PLT 466* 431* 424*   BMET  Recent Labs  06/19/12 1710 06/20/12 1357 06/21/12 0525  NA 133* 136 141  K 4.0 3.7 3.3*  CL 99 101 110  CO2 24 20 22   GLUCOSE 270* 206* 122*  BUN 8 10 12   CREATININE 0.67 0.65 0.68  CALCIUM 9.2 9.3 8.4   LFT  Recent Labs  06/20/12 1357  PROT 8.7*  ALBUMIN 3.3*  AST 12  ALT 5  ALKPHOS 78  BILITOT 0.4   PT/INR No results found for this basename: LABPROT, INR,  in the last 72 hours Hepatitis Panel No results found for this basename: HEPBSAG, HCVAB, HEPAIGM, HEPBIGM,  in the last 72 hours C-Diff No results found for this basename: CDIFFTOX,  in the last 72 hours Fecal Lactopherrin No results found for this basename: FECLLACTOFRN,  in the last 72 hours  Studies/Results: Ct Head W Wo Contrast  06/20/2012  *RADIOLOGY REPORT*  Clinical Data: Chronic nausea with vomiting.  Evaluate for intracranial lesion. History of multiple substance abuse.  CT HEAD WITHOUT AND WITH CONTRAST  Technique:  Contiguous axial images were obtained from the base of the skull through the vertex without and with intravenous contrast.  Contrast: 75mL OMNIPAQUE IOHEXOL 300 MG/ML  SOLN  Comparison: 11/15/2011.  Findings: There is no evidence  for acute infarction, intracranial hemorrhage, mass lesion, hydrocephalus, or extra-axial fluid.  No atrophy or white matter disease.  Post contrast, no abnormal intracranial enhancement.  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   Calvarium intact.  Clear sinuses and mastoids.  IMPRESSION: Remote right cerebellar infarct.  Otherwise negative scan.   Original Report Authenticated By: Davonna Belling, M.[name].    Dg Abd Acute W/chest  06/19/2012  *RADIOLOGY REPORT*  Clinical Data: Emesis, abdominal pain  ACUTE ABDOMEN SERIES (ABDOMEN 2 VIEW & CHEST 1 VIEW)  Comparison: Chest and acute abdomen of 02/13/2012  Findings: The lungs are clear.  Mediastinal contours are stable. The heart is within normal limits in size.  No skeletal abnormality is seen.  Supine and erect views of the abdomen show no bowel obstruction. No free air is noted.  No opaque calculi are seen.  IMPRESSION:  1.  No active lung disease. 2.  No bowel obstruction.  No free air.   Original Report Authenticated By: Dwyane Dee, M.[name].     Medications:  Scheduled: . enoxaparin (LOVENOX) injection  40 mg Subcutaneous Q24H  . insulin aspart  0-9 Units Subcutaneous TID WC  . insulin glargine  20 Units Subcutaneous QHS  . labetalol  10 mg Intravenous Q6H  . mometasone-formoterol  2 puff Inhalation BID  . pantoprazole (PROTONIX) IV  40 mg Intravenous Q24H  . potassium chloride  10 mEq  Intravenous Q1 Hr x 3   Continuous: . sodium chloride 150 mL/hr at 06/21/12 0608    Assessment/Plan: 1) Nausea/Vomiting. 2) DM. 3) Polysubstance abuse.   Head CT is negative.  She is to have the gastric emptying scan today.  Plan: 1) Continue with supportive care. 2) Await gastric emptying scan.   LOS: 2 days   Shirley [name] 06/21/2012, 9:20 AM  Nursing just informed me that she drank a whole bottle of apple juice.  I was under the impression by the patient that she only drank a sip.  The gastric emptying scan is postponed until Monday.  In the  meantime I would avoid any narcotic medications or promotility agents.

## 2012-06-21 NOTE — Progress Notes (Signed)
Rn and student completed discharge instructions completed with patient. Patient given phone number of internal medicine as well as information about where to get her medications. All questions answered.

## 2012-06-21 NOTE — Discharge Summary (Signed)
Internal Medicine Teaching [facility] Discharge Note  Name: Shirley [name] MRN: [id number] DOB: May 25, 1975 37 y.o.  Date of Admission: 06/19/2012  4:04 PM Date of Discharge: 06/21/2012 Attending Physician: Gardiner Barefoot, MD  Discharge Diagnosis: 1. Acute on chronic abdominal pain associated with nausea and vomiting  2. Uncontrolled diabetes with complications (i.e. Possible gastroparesis, neuropathy) 3. Hypertension 4. Leukocytosis  5. Hypokalemia  6. Chronic microcytic anemia  7. Substance abuse (cocaine, marijuana)   Discharge Medications:   Medication List    STOP taking these medications       metoCLOPramide 10 MG tablet  Commonly known as:  REGLAN     oxyCODONE-acetaminophen 5-325 MG per tablet  Commonly known as:  PERCOCET      TAKE these medications       acetaminophen 500 MG tablet  Commonly known as:  TYLENOL  Take 1,000 mg by mouth every 6 (six) hours as needed for pain.     albuterol 108 (90 BASE) MCG/ACT inhaler  Commonly known as:  PROVENTIL HFA;VENTOLIN HFA  Inhale 2 puffs into the lungs every 4 (four) hours as needed. For shortness of breath     Fluticasone-Salmeterol 100-50 MCG/DOSE Aepb  Commonly known as:  ADVAIR  Inhale 1 puff into the lungs every 12 (twelve) hours.     insulin glargine 100 UNIT/ML injection  Commonly known as:  LANTUS  Inject 20 Units into the skin at bedtime.     labetalol 100 MG tablet  Commonly known as:  NORMODYNE  Take 0.5 tablets (50 mg total) by mouth 2 (two) times daily.     omeprazole 40 MG capsule  Commonly known as:  PRILOSEC  Take 1 capsule (40 mg total) by mouth daily.     promethazine 25 MG tablet  Commonly known as:  PHENERGAN  Take 1 tablet (25 mg total) by mouth every 6 (six) hours as needed. For nausea        Disposition and follow-up:   Shirley [name] was discharged from [facility] in stable condition.  At the hospital follow up visit please address: 1. Review  compliance with medications and affordability of medication (i.e Lantus, etc) 2. Make sure patient has avoided motility agents (i.e Reglan and narcotics) and reschedule/refer for gastric emptying study outpatient with Dr. Elnoria Howard as soon as possible  3. May need to titrate the insulin up 4. Refer to social work for any needs  5. Address substance abuse  6. May resume reglan once gastric emptying study complete 7. Decide on means of pain control if needed  8. Repeat BMET, CBC  Follow-up Appointments:  Discharge Orders   Future Appointments Provider Department Dept Phone   07/01/2012 3:15 PM Manuela Schwartz, MD [facility] [phone]   Future Orders Complete By Expires     Call MD for:  persistant nausea and vomiting  As directed     Call MD for:  severe uncontrolled pain  As directed     Discharge instructions  As directed     Comments:      Eat small meals every 2 hours Try to avoid Reglan and Percocet or other stronger pain medications until you have the gastric emptying study done Follow up Internal Medicine 07/01/12 at 10:15 am  Reduce your Lantus to 20 units at night. We may go up on it in the future Pick up your medications from [facility] on [address]. And take all your medications as instructed.  Increase activity slowly  As directed        Consultations:  Dr. Hung-Gastroenterology   Procedures Performed:  Ct Head W Wo Contrast  06/20/2012  *RADIOLOGY REPORT*  Clinical Data: Chronic nausea with vomiting.  Evaluate for intracranial lesion. History of multiple substance abuse.  CT HEAD WITHOUT AND WITH CONTRAST  Technique:  Contiguous axial images were obtained from the base of the skull through the vertex without and with intravenous contrast.  Contrast: 75mL OMNIPAQUE IOHEXOL 300 MG/ML  SOLN  Comparison: 11/15/2011.  Findings: There is no evidence for acute infarction, intracranial hemorrhage, mass lesion, hydrocephalus, or extra-axial fluid.  No  atrophy or white matter disease.  Post contrast, no abnormal intracranial enhancement.  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   Calvarium intact.  Clear sinuses and mastoids.  IMPRESSION: Remote right cerebellar infarct.  Otherwise negative scan.   Original Report Authenticated By: Davonna Belling, M.D.    Dg Abd Acute W/chest  06/19/2012  *RADIOLOGY REPORT*  Clinical Data: Emesis, abdominal pain  ACUTE ABDOMEN SERIES (ABDOMEN 2 VIEW & CHEST 1 VIEW)  Comparison: Chest and acute abdomen of 02/13/2012  Findings: The lungs are clear.  Mediastinal contours are stable. The heart is within normal limits in size.  No skeletal abnormality is seen.  Supine and erect views of the abdomen show no bowel obstruction. No free air is noted.  No opaque calculi are seen.  IMPRESSION:  1.  No active lung disease. 2.  No bowel obstruction.  No free air.   Original Report Authenticated By: Dwyane Dee, M.D.    Admission HPI:  Chief Complaint: Abdominal pain, nausea, vomiting  History of Present Illness:  Patient is a 37 y.o. female with a history of uncontrolled DM2, chronic episodic nausea, vomiting, abdominal pain thought likely diabetic gastroparesis, polysubstance abuse (cocaine, marijuana), hypertension who is admitted to [facility] on 06/20/2012 transfer from [facility] for 1 day history of acute on chronic left lower quadrant abdominal pain with associated nausea and vomiting that started at approximately 3PM. The patient indicates associated vomiting of clear emesis without hematemesis. Has had sharp, shooting pains in left lower quadrant and left upper quadrant. No associated constipation, blood in stool, dysuria, hematuria, fevers, chills, shortness of breath. The patient notably has not filled any of her medications, including her insulin and reglan, in > 1 month says she is unable to afford it. Additionally, the patient was previously seen by Dr. Elnoria Howard, [facility], in October 2013 at which time an EGD and  colonoscopy were planned, however patient was lost to followup.  Review of Systems:  Constitutional:  denies fever, chills, diaphoresis, appetite change and fatigue.   HEENT:  denies eye pain, redness, hearing loss, ear pain, congestion, sore throat, rhinorrhea.   Respiratory:  denies SOB, DOE, cough, chest tightness, and wheezing.   Cardiovascular:  denies chest pain, palpitations and leg swelling.   Gastrointestinal:  admits to nausea, vomiting, abdominal pain.  Denies diarrhea, constipation, blood in stool.   Genitourinary:  denies dysuria, urgency, frequency, hematuria, flank pain and difficulty urinating.   Musculoskeletal:  denies myalgias, back pain, joint swelling, arthralgias and gait problem.   Skin:  denies pallor, rash and wound.   Neurological:  denies dizziness, seizures, syncope, weakness, light-headedness, numbness and headaches.   Hematological:  denies easy bruising, personal or family bleeding history.    Physical Exam:  General:  Vital signs reviewed and noted. Female lying in bed, falling asleep frequently during evaluation. When she awakens, begins  shaking her entire body and moaning. Oriented x3 when awoken.   Head:  Normocephalic, atraumatic.   Neck:  No deformities, masses, or tenderness noted.Supple, No carotid Bruits, no JVD.   Lungs:  Normal respiratory effort. Clear to auscultation BL without crackles or wheezes.   Heart:  Tachycardic to 110s, rhythm regular. S1 and S2 normal without gallop, murmur, or rubs.   Abdomen:  TTP of LLQ and LUQ. Some voluntary guarding, no rebound. Normoactive BS.   Extremities:  No pretibial edema.   Neurologic:  CN II - XII are grossly intact. Motor strength is 5/5 in the all 4 extremities, Sensations intact to light touch upper and lower extremities   Skin:  No visible rashes     Physical Exam:  General:  Vital signs reviewed and noted. Female lying in bed, falling asleep frequently during evaluation. When she awakens, begins  shaking her entire body and moaning. Oriented x3 when awoken.   Head:  Normocephalic, atraumatic.   Neck:  No deformities, masses, or tenderness noted.Supple, No carotid Bruits, no JVD.   Lungs:  Normal respiratory effort. Clear to auscultation BL without crackles or wheezes.   Heart:  Tachycardic to 110s, rhythm regular. S1 and S2 normal without gallop, murmur, or rubs.   Abdomen:  TTP of LLQ and LUQ. Some voluntary guarding, no rebound. Normoactive BS.   Extremities:  No pretibial edema.   Neurologic:  CN II - XII are grossly intact. Motor strength is 5/5 in the all 4 extremities, Sensations intact to light touch upper and lower extremities   Skin:  No visible rashes      Hospital Course by problem list: 1. Acute on chronic abdominal pain associated with nausea and vomiting  2. Uncontrolled diabetes with complications (i.e. Possible gastroparesis, neuropathy) 3. Hypertension 4. Leukocytosis  5. Hypokalemia  6. Chronic microcytic anemia  7. Substance abuse (cocaine, marijuana)    1.Acute on chronic abdominal pain  37 y.o presented 2/26 to [facility] ED for abdominal pain which is acute on chronic associated with nausea, vomiting.  She was transferred to [facility] for further care.  Etiology unclear though Dr. Elnoria Howard states clinically appears to be gastroparesis and previously thought to be diabetic gastroparesis.  These symptoms have been chronic for the patient and she has lost weight 130 pounds since 2009.  Her 2011 EGD showed mild gastritis, Hylori + requiring antibiotics.  2011 gastric emptying study was normal but may have been falsely negative in the setting of Reglan use.  The patient has been noncompliant with Reglan at home.  Previous CT abdomen and [place] 02/2011 with no evidence of acute pathology except left sided corpus luteal cyst.  Dr. Elnoria Howard planned to do and EGD/colonoscopy in the past but the patient was lost to follow up.  We gave her Reglan, Phenergan and initially Dilaudid  which was switched to Morphine and Toradol for pain control.  Then in attempt to do a gastric emptying study this admission we held Reglan (antimotility agent) for 48 hours but in the time the patient was supposed to be NPO she drank a bottle of Apple juice and possibly ate some chips purchased by her female partner at bedside.  On day of discharge her vomiting was resolved and she had improved nausea.  The gastric emptying study had to be cancelled due to the patient eating and drinking and would take another 3 days to be on the schedule.  Due to these factors and the patient being able to tolerate  oral we will discharge with outpatient Internal Medicine follow up.  Hold antimotility and narcotic agents until Internal Medicine can refer for another outpatient gastric emptying study in the near future.    2. Uncontrolled DM  HA1C > 14 12/2011.  Patient was noncompliant with medications stating she could not afford then prior to admission.  We had her on moderate SSI, Lantus 20 units qhs (reduced from home dose of 40 units qhs).  Her glucose was better controlled over 24 hours.  We emphasized home compliance with medications.      3. HTN  Elevated on admission but became more controlled 137/90 on day of discharge.  She was given Labetalol 10 mg q6  I.v and we will transition to her home oral meds at discharge.  Elevation on admission likely due to noncompliance, cocaine abuse, pain.    4. Leukocytosis  Trended down this admission.     5. Hypokalemia On day of discharge.  Replaced K 10 meQ x 3.   6. Chronic microcytic anemia  Baseline hemoglobin 9-11.  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 Chart review suggests Hemoglobin C, and electrophoresis in 2009, but no documentation of electrophoresis itself.  Repeat cbc at hospital follow up.    7.  Substance abuse (cocaine, marijuana)  Historically has refused social work/ psych help with cessation.  May need referral in the future if willing.   She was given normal saline 150 cc/hr but we discontinued the intravenous fluids and she was able to tolerate apple juice and possibly chips prior to discharge.  She will advance diet as tolerated but it is recommended that she eat small meals every two hours. She was on DVT prophylaxis with Lovenox.    Discharge Vitals:  BP 137/90  Pulse 95  Temp(Src) 97.9 F (36.6 C) (Oral)  Resp 18  Ht 5\' 6"  (1.676 m)  Wt 214 lb 4.6 oz (97.2 kg)  BMI 34.6 kg/m2  SpO2 100%  LMP 06/19/2012  Discharge physical exam:  General:lying in bed, nad, alert and oriented x 3  HEENT: [place]/at  CV: RRR no r/m/g  Lungs: ctab  Abdomen: soft, obese, nd, +normal bs, no rebounding or guarding LLQ with deep palpation  Extremities:no cyanosis, warm, no edema  Neuro: CN 2-12 grossly intact, motor strength normal   Discharge Labs:  Results for SILVIA, HIGHTOWER (MRN [id number]) as of 06/21/2012 12:36  Ref. Range 06/19/2012 17:10 06/20/2012 13:57 06/20/2012 15:52 06/21/2012 05:25  Sodium Latest Range: 135-145 mEq/L 133 (L) 136  141  Potassium Latest Range: 3.5-5.1 mEq/L 4.0 3.7  3.3 (L)  Chloride Latest Range: 96-112 mEq/L 99 101  110  CO2 Latest Range: 19-32 mEq/L 24 20  22   BUN Latest Range: 6-23 mg/dL 8 10  12   Creatinine Latest Range: 0.50-1.10 mg/dL 4.09 8.11  9.14  Calcium Latest Range: 8.4-10.5 mg/dL 9.2 9.3  8.4  GFR calc non Af Amer Latest Range: >90 mL/min >90 >90  >90  GFR calc Af Amer Latest Range: >90 mL/min >90 >90  >90  Glucose Latest Range: 70-99 mg/dL 782 (H) 956 (H)  213 (H)  Phosphorus Latest Range: 2.3-4.6 mg/dL    2.3  Magnesium Latest Range: 1.5-2.5 mg/dL    2.0  Alkaline Phosphatase Latest Range: 39-117 U/L 78 78    Albumin Latest Range: 3.5-5.2 g/dL 3.3 (L) 3.3 (L)    Lipase Latest  Range: 11-59 U/L 27     AST Latest Range: 0-37 U/L 21 12    ALT  Latest Range: 0-35 U/L 5 5    Total Protein Latest Range: 6.0-8.3 g/dL 8.4 (H) 8.7 (H)    Total Bilirubin Latest Range: 0.3-1.2 mg/dL 0.4 0.4    Lactic Acid, Venous Latest Range: 0.5-2.2 mmol/L   1.2    Results for AKYLAH, HASCALL (MRN [id number]) as of 06/21/2012 12:36  Ref. Range 06/19/2012 17:10 06/20/2012 15:53 06/21/2012 05:25  WBC Latest Range: 4.0-10.5 K/uL 12.1 (H) 16.6 (H) 13.2 (H)  RBC Latest Range: 3.87-5.11 MIL/uL 5.19 (H) 4.66 4.50  Hemoglobin Latest Range: 12.0-15.0 g/dL 40.9 (L) 81.1 (L) 9.9 (L)  HCT Latest Range: 36.0-46.0 % 34.3 (L) 30.6 (L) 29.9 (L)  MCV Latest Range: 78.0-100.0 fL 66.1 (L) 65.7 (L) 66.4 (L)  MCH Latest Range: 26.0-34.0 pg 21.8 (L) 22.5 (L) 22.0 (L)  MCHC Latest Range: 30.0-36.0 g/dL 91.4 78.2 95.6  RDW Latest Range: 11.5-15.5 % 18.0 (H) 18.0 (H) 18.5 (H)  Platelets Latest Range: 150-400 K/uL 466 (H) 431 (H) 424 (H)  Neutrophils Relative Latest Range: 43-77 % 68    Lymphocytes Relative Latest Range: 12-46 % 23    Monocytes Relative Latest Range: 3-12 % 6    Eosinophils Relative Latest Range: 0-5 % 3    Basophils Relative Latest Range: 0-1 % 0    NEUT# Latest Range: 1.7-7.7 K/uL 8.3 (H)    Lymphocytes Absolute Latest Range: 0.7-4.0 K/uL 2.8    Monocytes Absolute Latest Range: 0.1-1.0 K/uL 0.8    Eosinophils Absolute Latest Range: 0.0-0.7 K/uL 0.3    Basophils Absolute Latest Range: 0.0-0.1 K/uL 0.0     Results for PEARLEY, MILLINGTON (MRN [id number]) as of 06/21/2012 12:36  Ref. Range 06/21/2011 02:18  Prothrombin Time Latest Range: 11.6-15.2 seconds 14.2  INR Latest Range: 0.00-1.49  1.08  aPTT Latest Range: 24-37 seconds 20 (L)   Results for SHAILEY, BUTTERBAUGH (MRN [id number]) as of 06/21/2012 12:36  Ref. Range 06/21/2011 02:18  Salicylate Lvl Latest Range: 2.8-20.0 mg/dL <4.1 (L)  Acetaminophen (Tylenol), Serum Latest Range: 10-30 ug/mL <15.0   Results for KATILYNN, SINKLER (MRN [id number]) as of 06/21/2012 12:36  Ref. Range 06/19/2012 18:49  Preg Test,  Ur Latest Range: NEGATIVE  NEGATIVE  Results for ABIGAEL, MOGLE (MRN [id number]) as of 06/21/2012 12:36  Ref. Range 06/19/2012 18:49  Color, Urine Latest Range: YELLOW  YELLOW  APPearance Latest Range: CLEAR  CLEAR  Specific Gravity, Urine Latest Range: 1.005-1.030  1.040 (H)  pH Latest Range: 5.0-8.0  7.0  Glucose Latest Range: NEGATIVE mg/dL >4742 (A)  Bilirubin Urine Latest Range: NEGATIVE  NEGATIVE  Ketones, ur Latest Range: NEGATIVE mg/dL 40 (A)  Protein Latest Range: NEGATIVE mg/dL NEGATIVE  Urobilinogen, UA Latest Range: 0.0-1.0 mg/dL 1.0  Nitrite Latest Range: NEGATIVE  NEGATIVE  Leukocytes, UA Latest Range: NEGATIVE  NEGATIVE  Hgb urine dipstick Latest Range: NEGATIVE  NEGATIVE  Squamous Epithelial / LPF Latest Range: RARE  FEW (A)   Results for ESTHELA, BRANDNER (MRN [id number]) as of 06/21/2012 12:36  Ref. Range 06/19/2012 18:49  AMPHETAMINES Latest Range: NONE DETECTED  NONE DETECTED  Barbiturates Latest Range: NONE DETECTED  NONE DETECTED  Benzodiazepines Latest Range: NONE DETECTED  NONE DETECTED  Opiates Latest Range: NONE DETECTED  NONE DETECTED  COCAINE Latest Range: NONE DETECTED  POSITIVE (A)  Tetrahydrocannabinol Latest Range: NONE DETECTED  POSITIVE (A)    Results for orders placed during the hospital  encounter of 06/19/12 (from the past 24 hour(s))  COMPREHENSIVE METABOLIC PANEL     Status: Abnormal   Collection Time    06/20/12  1:57 PM      Result Value Range   Sodium 136  135 - 145 mEq/L   Potassium 3.7  3.5 - 5.1 mEq/L   Chloride 101  96 - 112 mEq/L   CO2 20  19 - 32 mEq/L   Glucose, Bld 206 (*) 70 - 99 mg/dL   BUN 10  6 - 23 mg/dL   Creatinine, Ser 1.61  0.50 - 1.10 mg/dL   Calcium 9.3  8.4 - 09.6 mg/dL   Total Protein 8.7 (*) 6.0 - 8.3 g/dL   Albumin 3.3 (*) 3.5 - 5.2 g/dL   AST 12  0 - 37 U/L   ALT 5  0 - 35 U/L   Alkaline Phosphatase 78  39 - 117 U/L   Total Bilirubin 0.4  0.3 - 1.2 mg/dL   GFR calc non Af Amer >90  >90 mL/min   GFR calc Af  Amer >90  >90 mL/min  LACTIC ACID, PLASMA     Status: None   Collection Time    06/20/12  3:52 PM      Result Value Range   Lactic Acid, Venous 1.2  0.5 - 2.2 mmol/L  CBC     Status: Abnormal   Collection Time    06/20/12  3:53 PM      Result Value Range   WBC 16.6 (*) 4.0 - 10.5 K/uL   RBC 4.66  3.87 - 5.11 MIL/uL   Hemoglobin 10.5 (*) 12.0 - 15.0 g/dL   HCT 04.5 (*) 40.9 - 81.1 %   MCV 65.7 (*) 78.0 - 100.0 fL   MCH 22.5 (*) 26.0 - 34.0 pg   MCHC 34.3  30.0 - 36.0 g/dL   RDW 91.4 (*) 78.2 - 95.6 %   Platelets 431 (*) 150 - 400 K/uL  GLUCOSE, CAPILLARY     Status: Abnormal   Collection Time    06/20/12  5:59 PM      Result Value Range   Glucose-Capillary 184 (*) 70 - 99 mg/dL  GLUCOSE, CAPILLARY     Status: Abnormal   Collection Time    06/20/12 10:26 PM      Result Value Range   Glucose-Capillary 143 (*) 70 - 99 mg/dL  BASIC METABOLIC PANEL     Status: Abnormal   Collection Time    06/21/12  5:25 AM      Result Value Range   Sodium 141  135 - 145 mEq/L   Potassium 3.3 (*) 3.5 - 5.1 mEq/L   Chloride 110  96 - 112 mEq/L   CO2 22  19 - 32 mEq/L   Glucose, Bld 122 (*) 70 - 99 mg/dL   BUN 12  6 - 23 mg/dL   Creatinine, Ser 2.13  0.50 - 1.10 mg/dL   Calcium 8.4  8.4 - 08.6 mg/dL   GFR calc non Af Amer >90  >90 mL/min   GFR calc Af Amer >90  >90 mL/min  CBC     Status: Abnormal   Collection Time    06/21/12  5:25 AM      Result Value Range   WBC 13.2 (*) 4.0 - 10.5 K/uL   RBC 4.50  3.87 - 5.11 MIL/uL   Hemoglobin 9.9 (*) 12.0 - 15.0 g/dL   HCT 57.8 (*) 46.9 - 62.9 %  MCV 66.4 (*) 78.0 - 100.0 fL   MCH 22.0 (*) 26.0 - 34.0 pg   MCHC 33.1  30.0 - 36.0 g/dL   RDW 16.1 (*) 09.6 - 04.5 %   Platelets 424 (*) 150 - 400 K/uL  MAGNESIUM     Status: None   Collection Time    06/21/12  5:25 AM      Result Value Range   Magnesium 2.0  1.5 - 2.5 mg/dL  PHOSPHORUS     Status: None   Collection Time    06/21/12  5:25 AM      Result Value Range   Phosphorus 2.3  2.3 - 4.6  mg/dL  GLUCOSE, CAPILLARY     Status: Abnormal   Collection Time    06/21/12  5:40 AM      Result Value Range   Glucose-Capillary 126 (*) 70 - 99 mg/dL  GLUCOSE, CAPILLARY     Status: Abnormal   Collection Time    06/21/12  8:17 AM      Result Value Range   Glucose-Capillary 255 (*) 70 - 99 mg/dL  GLUCOSE, CAPILLARY     Status: Abnormal   Collection Time    06/21/12 11:57 AM      Result Value Range   Glucose-Capillary 185 (*) 70 - 99 mg/dL    Signed: Annett Gula 06/21/2012, 1:43 PM   Time Spent on Discharge: 31 minutes  Services Ordered on Discharge: none Equipment Ordered on Discharge: none

## 2012-06-21 NOTE — Telephone Encounter (Signed)
Talked with Dr Everardo Beals. Lyrica will not be refilled

## 2012-06-21 NOTE — Discharge Summary (Signed)
Care discussed with patient and resident team and I agree with above as outlined.  No further indication for hospitalization and further work up can be scheduled as an outpatient.  She is tolerating po intake and has had no electrolyte abnormalities.  Follow up appt arranged.

## 2012-06-21 NOTE — Progress Notes (Addendum)
Subjective: Patient c/o 9/10 LLQ ab pain sharp stabbing like contractions not relieved with morphine. C/o nausea relieved with phenergan.  She drank a bottle of apple juice within the 24 hours she was supposed to be NPO.  Possibly ate chips per RN  Objective: Vital signs in last 24 hours: Filed Vitals:   06/20/12 0825 06/20/12 1321 06/20/12 2224 06/21/12 0600  BP:  176/108 111/67 137/90  Pulse:  119 106 95  Temp:  99.1 F (37.3 C) 98.3 F (36.8 C) 97.9 F (36.6 C)  TempSrc:      Resp:  18 18 18   Height:      Weight:    214 lb 4.6 oz (97.2 kg)  SpO2: 98%  100% 100%   Weight change: -3.5 oz (-0.1 kg)  Intake/Output Summary (Last 24 hours) at 06/21/12 1224 Last data filed at 06/21/12 0601  Gross per 24 hour  Intake      0 ml  Output      0 ml  Net      0 ml   General:lying in bed, nad, alert and oriented x 3 HEENT: Dean/at CV: RRR no r/m/g Lungs: ctab Abdomen: soft, obese, nd, +normal bs, no rebounding or guarding LLQ with deep palpation Extremities:no cyanosis, warm, no edema  Neuro: CN 2-12 grossly intact, motor strength normal   Lab Results: Basic Metabolic Panel:  Recent Labs Lab 06/20/12 1357 06/21/12 0525  NA 136 141  K 3.7 3.3*  CL 101 110  CO2 20 22  GLUCOSE 206* 122*  BUN 10 12  CREATININE 0.65 0.68  CALCIUM 9.3 8.4  MG  --  2.0  PHOS  --  2.3   Liver Function Tests:  Recent Labs Lab 06/19/12 1710 06/20/12 1357  AST 21 12  ALT 5 5  ALKPHOS 78 78  BILITOT 0.4 0.4  PROT 8.4* 8.7*  ALBUMIN 3.3* 3.3*    Recent Labs Lab 06/19/12 1710  LIPASE 27   CBC:  Recent Labs Lab 06/19/12 1710 06/20/12 1553 06/21/12 0525  WBC 12.1* 16.6* 13.2*  NEUTROABS 8.3*  --   --   HGB 11.3* 10.5* 9.9*  HCT 34.3* 30.6* 29.9*  MCV 66.1* 65.7* 66.4*  PLT 466* 431* 424*   CBG:  Recent Labs Lab 06/20/12 1235 06/20/12 1759 06/20/12 2226 06/21/12 0540 06/21/12 0817 06/21/12 1157  GLUCAP 194* 184* 143* 126* 255* 185*   Urine Drug Screen: Drugs of  Abuse     Component Value Date/Time   LABOPIA NONE DETECTED 06/19/2012 1849   LABOPIA NEGATIVE 02/21/2010 2201   COCAINSCRNUR POSITIVE* 06/19/2012 1849   COCAINSCRNUR NEGATIVE 02/21/2010 2201   LABBENZ NONE DETECTED 06/19/2012 1849   LABBENZ NEGATIVE 02/21/2010 2201   AMPHETMU NONE DETECTED 06/19/2012 1849   AMPHETMU NEGATIVE 02/21/2010 2201   THCU POSITIVE* 06/19/2012 1849   LABBARB NONE DETECTED 06/19/2012 1849    Urinalysis:  Recent Labs Lab 06/19/12 1849  COLORURINE YELLOW  LABSPEC 1.040*  PHURINE 7.0  GLUCOSEU >1000*  HGBUR NEGATIVE  BILIRUBINUR NEGATIVE  KETONESUR 40*  PROTEINUR NEGATIVE  UROBILINOGEN 1.0  NITRITE NEGATIVE  LEUKOCYTESUR NEGATIVE    Studies/Results: Ct Head W Wo Contrast  06/20/2012  *RADIOLOGY REPORT*  Clinical Data: Chronic nausea with vomiting.  Evaluate for intracranial lesion. History of multiple substance abuse.  CT HEAD WITHOUT AND WITH CONTRAST  Technique:  Contiguous axial images were obtained from the base of the skull through the vertex without and with intravenous contrast.  Contrast: 75mL OMNIPAQUE IOHEXOL 300 MG/ML  SOLN  Comparison: 11/15/2011.  Findings: There is no evidence for acute infarction, intracranial hemorrhage, mass lesion, hydrocephalus, or extra-axial fluid.  No atrophy or white matter disease.  Post contrast, no abnormal intracranial enhancement.  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   Calvarium intact.  Clear sinuses and mastoids.  IMPRESSION: Remote right cerebellar infarct.  Otherwise negative scan.   Original Report Authenticated By: Davonna Belling, M.D.    Dg Abd Acute W/chest  06/19/2012  *RADIOLOGY REPORT*  Clinical Data: Emesis, abdominal pain  ACUTE ABDOMEN SERIES (ABDOMEN 2 VIEW & CHEST 1 VIEW)  Comparison: Chest and acute abdomen of 02/13/2012  Findings: The lungs are clear.  Mediastinal contours are stable. The heart is within normal limits in size.  No skeletal abnormality is seen.  Supine and erect views of the  abdomen show no bowel obstruction. No free air is noted.  No opaque calculi are seen.  IMPRESSION:  1.  No active lung disease. 2.  No bowel obstruction.  No free air.   Original Report Authenticated By: Dwyane Dee, M.D.    Medications: Scheduled Meds: . enoxaparin (LOVENOX) injection  40 mg Subcutaneous Q24H  . insulin aspart  0-9 Units Subcutaneous TID WC  . insulin glargine  20 Units Subcutaneous QHS  . labetalol  10 mg Intravenous Q6H  . mometasone-formoterol  2 puff Inhalation BID  . pantoprazole (PROTONIX) IV  40 mg Intravenous Q24H   Continuous Infusions:   PRN Meds:.albuterol, ketorolac, morphine injection, promethazine Assessment/Plan: 37 y.o presented 2/26 to [facility] ED for abdominal pain which is acute on chronic with nausea, vomiting.  1.Acute on chronic abdominal pain  -associated with nausea, vomiting. Resolved vomiting, improving nausea -?etiology Dr. 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 Previous gastric emptying study and EGD negative 2011 then lost to follow up with Dr. Elnoria Howard -repeat gastric emptying study scheduled for 2/28 but not able to complete today as patient has been drinking and possibly eating over 24 hours -outpatient gastric emptying study to be scheduled by Internal Medicine at f/u appt 07/01/12 and patient should be off motility and narcotics  -prn morphine 2 mg q6, Toradol 30 q6, Phenergan   2. Uncontrolled DM  -HA1C > 14 12/2011  -better controlled over 24 hours  -SSI, Lantus 20 qhs  -Need to emphasize home compliance with meds -patient states she was not able to afford but emphasized need to get medication today  3. HTN -controlled 137/90 -Labetalol 10 mg q6 -transition to oral meds  4.  Leukocytosis  -trending down, trend cbc  5.  Chronic microcytic anemia -stable will trend cbc in future   6. F/E/N -NS 150 cc/hr will d/c  -Hypokalemia-replaced K 10 x 3  -advanced diet as  tolerated   7. DVT px  -Lovenox  Dispo: D/c today with 07/01/12 10:15 am f/u Internal Medicine Clinic  The patient does have a current PCP (SCHOOLER, KAREN, MD), therefore will be requiring [facility]PC follow-up after discharge.   The patient does not have transportation limitations that hinder transportation to clinic appointments.  .Services Needed at time of discharge: Y = Yes, Blank = No PT: N  OT: N  RN: N  Equipment: None   Other: N/A    LOS: 2 days   Annett Gula [phone] 06/21/2012, 12:24 PM

## 2012-06-26 IMAGING — CR DG CHEST 1V PORT
1 series · 1 of 1 positions shown · non-contrast
Comparison: 11/01/2009

CLINICAL DATA: Chest pain, shortness of breath.

PORTABLE CHEST - 1 VIEW

[AP]
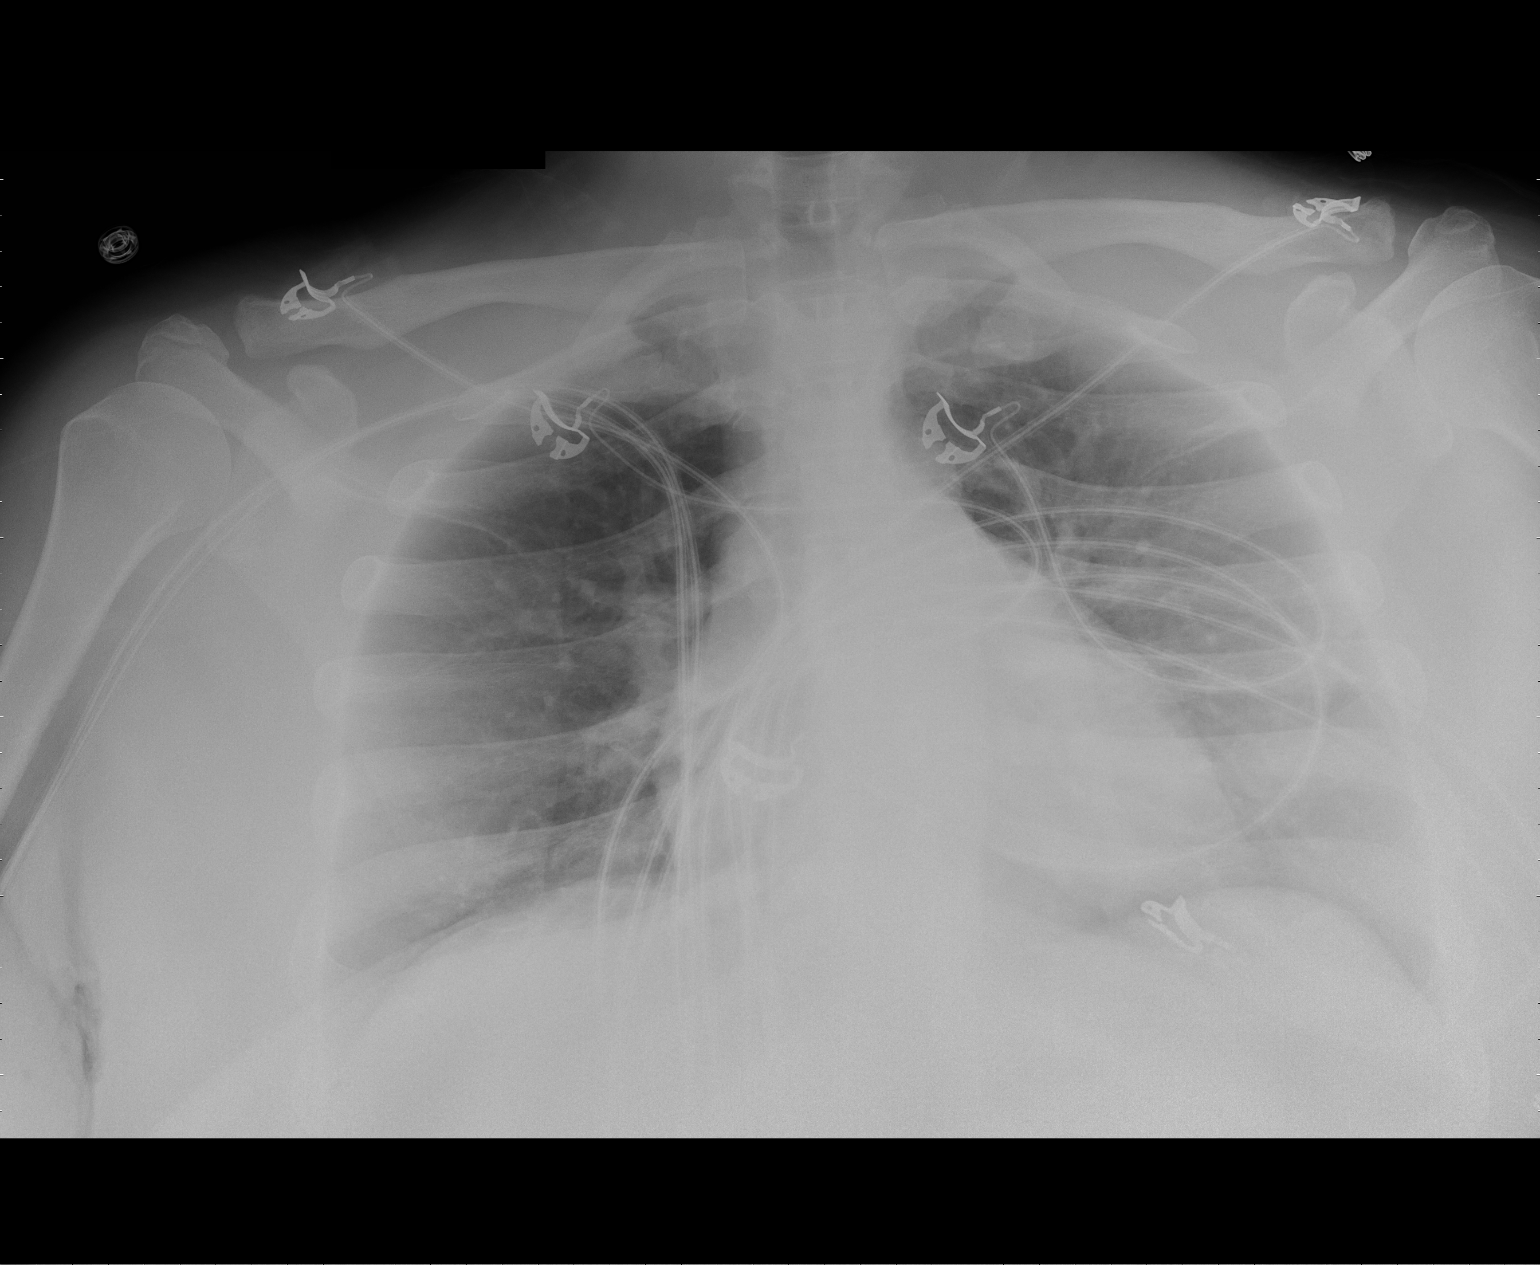

[1 of 1 positions shown; findings below may reference images not displayed]

FINDINGS: Study is severely AP lordotic in positioning.  I suspect
heart is within normal limits in size.  Lungs appear clear.  No
effusions or acute bony abnormality.
IMPRESSION: Severely AP lordotic study.  No active disease.

## 2012-07-01 ENCOUNTER — Encounter: Payer: Medicaid Other | Admitting: Internal Medicine

## 2012-07-02 ENCOUNTER — Encounter (HOSPITAL_COMMUNITY): Payer: Self-pay | Admitting: Emergency Medicine

## 2012-07-02 ENCOUNTER — Emergency Department (HOSPITAL_COMMUNITY)
Admission: EM | Admit: 2012-07-02 | Discharge: 2012-07-02 | Disposition: A | Discharge status: Home / Self Care | Payer: Medicaid Other | Attending: Emergency Medicine | Admitting: Emergency Medicine

## 2012-07-02 ENCOUNTER — Emergency Department (HOSPITAL_COMMUNITY): Payer: Medicaid Other

## 2012-07-02 DIAGNOSIS — Z794 Long term (current) use of insulin: Secondary | ICD-10-CM | POA: Insufficient documentation

## 2012-07-02 DIAGNOSIS — K3184 Gastroparesis: Secondary | ICD-10-CM | POA: Insufficient documentation

## 2012-07-02 DIAGNOSIS — E669 Obesity, unspecified: Secondary | ICD-10-CM | POA: Insufficient documentation

## 2012-07-02 DIAGNOSIS — R1031 Right lower quadrant pain: Secondary | ICD-10-CM | POA: Insufficient documentation

## 2012-07-02 DIAGNOSIS — I10 Essential (primary) hypertension: Secondary | ICD-10-CM | POA: Insufficient documentation

## 2012-07-02 DIAGNOSIS — F1210 Cannabis abuse, uncomplicated: Secondary | ICD-10-CM | POA: Insufficient documentation

## 2012-07-02 DIAGNOSIS — Z872 Personal history of diseases of the skin and subcutaneous tissue: Secondary | ICD-10-CM | POA: Insufficient documentation

## 2012-07-02 DIAGNOSIS — Z8719 Personal history of other diseases of the digestive system: Secondary | ICD-10-CM | POA: Insufficient documentation

## 2012-07-02 DIAGNOSIS — Z8673 Personal history of transient ischemic attack (TIA), and cerebral infarction without residual deficits: Secondary | ICD-10-CM | POA: Insufficient documentation

## 2012-07-02 DIAGNOSIS — I2510 Atherosclerotic heart disease of native coronary artery without angina pectoris: Secondary | ICD-10-CM | POA: Insufficient documentation

## 2012-07-02 DIAGNOSIS — Z8739 Personal history of other diseases of the musculoskeletal system and connective tissue: Secondary | ICD-10-CM | POA: Insufficient documentation

## 2012-07-02 DIAGNOSIS — Z8619 Personal history of other infectious and parasitic diseases: Secondary | ICD-10-CM | POA: Insufficient documentation

## 2012-07-02 DIAGNOSIS — Z8742 Personal history of other diseases of the female genital tract: Secondary | ICD-10-CM | POA: Insufficient documentation

## 2012-07-02 DIAGNOSIS — R7309 Other abnormal glucose: Secondary | ICD-10-CM | POA: Insufficient documentation

## 2012-07-02 DIAGNOSIS — Z8639 Personal history of other endocrine, nutritional and metabolic disease: Secondary | ICD-10-CM | POA: Insufficient documentation

## 2012-07-02 DIAGNOSIS — IMO0002 Reserved for concepts with insufficient information to code with codable children: Secondary | ICD-10-CM | POA: Insufficient documentation

## 2012-07-02 DIAGNOSIS — R112 Nausea with vomiting, unspecified: Secondary | ICD-10-CM | POA: Insufficient documentation

## 2012-07-02 DIAGNOSIS — Z862 Personal history of diseases of the blood and blood-forming organs and certain disorders involving the immune mechanism: Secondary | ICD-10-CM | POA: Insufficient documentation

## 2012-07-02 DIAGNOSIS — Z87891 Personal history of nicotine dependence: Secondary | ICD-10-CM | POA: Insufficient documentation

## 2012-07-02 DIAGNOSIS — G8929 Other chronic pain: Secondary | ICD-10-CM | POA: Insufficient documentation

## 2012-07-02 DIAGNOSIS — I252 Old myocardial infarction: Secondary | ICD-10-CM | POA: Insufficient documentation

## 2012-07-02 DIAGNOSIS — Z87448 Personal history of other diseases of urinary system: Secondary | ICD-10-CM | POA: Insufficient documentation

## 2012-07-02 DIAGNOSIS — K219 Gastro-esophageal reflux disease without esophagitis: Secondary | ICD-10-CM | POA: Insufficient documentation

## 2012-07-02 DIAGNOSIS — Z8669 Personal history of other diseases of the nervous system and sense organs: Secondary | ICD-10-CM | POA: Insufficient documentation

## 2012-07-02 DIAGNOSIS — J45909 Unspecified asthma, uncomplicated: Secondary | ICD-10-CM | POA: Insufficient documentation

## 2012-07-02 DIAGNOSIS — IMO0001 Reserved for inherently not codable concepts without codable children: Secondary | ICD-10-CM | POA: Insufficient documentation

## 2012-07-02 DIAGNOSIS — Z8659 Personal history of other mental and behavioral disorders: Secondary | ICD-10-CM | POA: Insufficient documentation

## 2012-07-02 DIAGNOSIS — Z8679 Personal history of other diseases of the circulatory system: Secondary | ICD-10-CM | POA: Insufficient documentation

## 2012-07-02 LAB — COMPREHENSIVE METABOLIC PANEL
Albumin: 2.8 g/dL — ABNORMAL LOW (ref 3.5–5.2)
BUN: 9 mg/dL (ref 6–23)
Calcium: 8.9 mg/dL (ref 8.4–10.5)
GFR calc Af Amer: >90 mL/min (ref >90)
Glucose, Bld: 301 mg/dL — ABNORMAL HIGH (ref 70–99)
Total Protein: 7.8 g/dL (ref 6.0–8.3)

## 2012-07-02 LAB — GLUCOSE, CAPILLARY: Glucose-Capillary: 240 mg/dL — ABNORMAL HIGH (ref 70–99)

## 2012-07-02 LAB — CBC WITH DIFFERENTIAL/PLATELET
Basophils Absolute: 0.1 10*3/uL (ref 0.0–0.1)
Basophils Relative: 1 % (ref 0–1)
Eosinophils Absolute: 0.2 10*3/uL (ref 0.0–0.7)
HCT: 33 % — ABNORMAL LOW (ref 36.0–46.0)
Hemoglobin: 11 g/dL — ABNORMAL LOW (ref 12.0–15.0)
MCH: 22.2 pg — ABNORMAL LOW (ref 26.0–34.0)
MCHC: 33.3 g/dL (ref 30.0–36.0)
Monocytes Absolute: 0.7 10*3/uL (ref 0.1–1.0)
Neutro Abs: 8.7 10*3/uL — ABNORMAL HIGH (ref 1.7–7.7)
Neutrophils Relative %: 69 % (ref 43–77)
RDW: 18 % — ABNORMAL HIGH (ref 11.5–15.5)

## 2012-07-02 LAB — LIPASE, BLOOD: Lipase: 31 U/L (ref 11–59)

## 2012-07-02 MED ORDER — PROMETHAZINE HCL 12.5 MG PO TABS: 25.0000 mg | ORAL_TABLET | Freq: Four times a day (QID) | ORAL | Status: DC | PRN

## 2012-07-02 MED ORDER — SODIUM CHLORIDE 0.9 % IV BOLUS (SEPSIS)
1000.0000 mL | Freq: Once | INTRAVENOUS | Status: AC
  Administered 2012-07-02: 1000 mL via INTRAVENOUS

## 2012-07-02 MED ORDER — LORAZEPAM 2 MG/ML IJ SOLN
1.0000 mg | Freq: Once | INTRAMUSCULAR | Status: AC
  Administered 2012-07-02: 1 mg via INTRAVENOUS
  Filled 2012-07-02: qty 1

## 2012-07-02 MED ORDER — HYDROMORPHONE HCL PF 1 MG/ML IJ SOLN
1.0000 mg | Freq: Once | INTRAMUSCULAR | Status: AC
  Administered 2012-07-02: 1 mg via INTRAVENOUS
  Filled 2012-07-02: qty 1

## 2012-07-02 MED ORDER — PROMETHAZINE HCL 25 MG/ML IJ SOLN
25.0000 mg | Freq: Once | INTRAMUSCULAR | Status: AC
  Administered 2012-07-02: 25 mg via INTRAVENOUS
  Filled 2012-07-02: qty 1

## 2012-07-02 MED ORDER — HYDROCODONE-ACETAMINOPHEN 5-325 MG PO TABS: 2.0000 | ORAL_TABLET | ORAL | Status: DC | PRN

## 2012-07-02 NOTE — ED Provider Notes (Signed)
History     CSN: [id number]  Arrival date & time 07/02/12  1511   First MD Initiated Contact with Patient 07/02/12 1704      Chief Complaint  Patient presents with  . Abdominal Pain    (Consider location/radiation/quality/duration/timing/severity/associated sxs/prior treatment) Patient is a 37 y.o. female presenting with abdominal pain. The history is provided by the patient.  Abdominal Pain  patient here with nausea and vomiting x2 days. No associated fever or chills. Also some left lower quadrant abdominal pain that has been there for one year which is been evaluated multiple times without a diagnosis. No dysuria or hematuria. Symptoms have been constant and nothing makes them better or worse.  Past Medical History  Diagnosis Date  . Thrombocytosis     Hem/Onc suggested 2/2 chronic hepatits and/or iron deficiency anemia  . Iron deficiency anemia   . N&V (nausea and vomiting)     Chronic. Unclear etiology with multiple admission and ED visits. CT abdomen with and without contrast (02/2011)  showed no acute process. Gastic Emptying scan (01/2010) was normal. Ultrasound of the abdomen was within normal limits. Hepatitis B viral load was undectable. HIV NR. EGD - gastritis, Hpylori + s/p Rx  . Abdominal pain   . Hypertension   . Hyperlipidemia   . Diabetes mellitus type 2, uncontrolled, with complications   . Recurrent boils   . Back pain   . OSA (obstructive sleep apnea)   . 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 Cath 12/10- no restenosis or significant CAD progression  . Irregular menses     Small ovarian follicles seen on CT(9/06)  . History of pyelonephritis     H/o GrpB Pyelonephritis (9/06) and UTI- 07/11- E.Coli, 12/10- GBS  . Abscess of tunica vaginalis     10/09- Abundant S. aureus- sensitive to all abx  . Obesity   . Asthma   . Gastritis   .  Depression   . Hepatitis B, chronic     Hep BeAb+,Hep B cAb+ & Hep BsAg+ (9/06)  . Peripheral neuropathy   . Fibromyalgia   . GERD (gastroesophageal reflux disease)   . Migraine   . Anxiety   . Gastroparesis     secondary to poorly controlled DM, last emptying study performed 01/2010  was normal but may be falsely positive as pt was on reglan  . Blood dyscrasia   . H/O: CVA (cerebrovascular accident)     history of remote right cerebellar infarct noted on head CT at least since 10/2011    Past Surgical History  Procedure Laterality Date  . Cesarean section  1997    Family History  Problem Relation Age of Onset  . Diabetes Father     History  Substance Use Topics  . Smoking status: Former Smoker    Types: Cigarettes    Quit date: 04/24/1996  . Smokeless tobacco: Never Used     Comment: quit smoking cigarettes age 44  . Alcohol Use: Yes     Comment: 03/31/11 "used marijuana last ~ 2 wk ago; only drink on occasions"    OB History   Grav Para Term Preterm Abortions TAB SAB Ect Mult Living                  Review of Systems  Gastrointestinal: Positive for abdominal pain.  All other systems reviewed and are negative.  Allergies  Lisinopril  Home Medications   Current Outpatient Rx  Name  Route  Sig  Dispense  Refill  . acetaminophen (TYLENOL) 500 MG tablet   Oral   Take 1,000 mg by mouth every 6 (six) hours as needed for pain.         Marland Kitchen albuterol (PROVENTIL HFA;VENTOLIN HFA) 108 (90 BASE) MCG/ACT inhaler   Inhalation   Inhale 2 puffs into the lungs every 4 (four) hours as needed. For shortness of breath   1 Inhaler   6   . Fluticasone-Salmeterol (ADVAIR) 100-50 MCG/DOSE AEPB   Inhalation   Inhale 1 puff into the lungs every 12 (twelve) hours.   60 each   6   . insulin glargine (LANTUS) 100 UNIT/ML injection   Subcutaneous   Inject 20 Units into the skin at bedtime.   10 mL   3   . labetalol (NORMODYNE) 100 MG tablet   Oral   Take 0.5 tablets  (50 mg total) by mouth 2 (two) times daily.   60 tablet   0   . omeprazole (PRILOSEC) 40 MG capsule   Oral   Take 1 capsule (40 mg total) by mouth daily.   31 capsule   6   . promethazine (PHENERGAN) 25 MG tablet   Oral   Take 1 tablet (25 mg total) by mouth every 6 (six) hours as needed. For nausea   30 tablet   0     BP 131/82  Pulse 102  Temp(Src) 97.8 F (36.6 C) (Oral)  Resp 18  SpO2 100%  LMP 06/19/2012  Physical Exam  Nursing note and vitals reviewed. Constitutional: She is oriented to person, place, and time. She appears well-developed and well-nourished.  Non-toxic appearance. No distress.  HENT:  Head: Normocephalic and atraumatic.  Eyes: Conjunctivae, EOM and lids are normal. Pupils are equal, round, and reactive to light.  Neck: Normal range of motion. Neck supple. No tracheal deviation present. No mass present.  Cardiovascular: Normal rate, regular rhythm and normal heart sounds.  Exam reveals no gallop.   No murmur heard. Pulmonary/Chest: Effort normal and breath sounds normal. No stridor. No respiratory distress. She has no decreased breath sounds. She has no wheezes. She has no rhonchi. She has no rales.  Abdominal: Soft. Normal appearance and bowel sounds are normal. She exhibits no distension. There is tenderness in the left lower quadrant. There is no rigidity, no rebound, no guarding and no CVA tenderness.  Musculoskeletal: Normal range of motion. She exhibits no edema and no tenderness.  Neurological: She is alert and oriented to person, place, and time. She has normal strength. No cranial nerve deficit or sensory deficit. GCS eye subscore is 4. GCS verbal subscore is 5. GCS motor subscore is 6.  Skin: Skin is warm and dry. No abrasion and no rash noted.  Psychiatric: She has a normal mood and affect. Her speech is normal and behavior is normal.    ED Course  Procedures (including critical care time)  Labs Reviewed  CBC WITH DIFFERENTIAL - Abnormal;  Notable for the following:    WBC 12.4 (*)    Hemoglobin 11.0 (*)    HCT 33.0 (*)    MCV 66.5 (*)    MCH 22.2 (*)    RDW 18.0 (*)    Neutro Abs 8.7 (*)    All other components within normal limits  COMPREHENSIVE METABOLIC PANEL - Abnormal; Notable for the following:    Glucose, Bld 301 (*)  Albumin 2.8 (*)    Total Bilirubin 0.2 (*)    All other components within normal limits  GLUCOSE, CAPILLARY - Abnormal; Notable for the following:    Glucose-Capillary 240 (*)    All other components within normal limits  URINALYSIS, ROUTINE W REFLEX MICROSCOPIC  PREGNANCY, URINE  LIPASE, BLOOD   No results found.   No diagnosis found.    MDM  Patient given pain meds and antiemetics for her chronic abdominal pain. She's also mildly dehydrated and was given IV fluids. Mild hyperglycemia noted was treated with those fluids. She feels back to her baseline at this time is stable for discharge        Toy Baker, MD 07/02/12 2039

## 2012-07-02 NOTE — ED Notes (Signed)
PER EMS- pt picked up from home with c/o abd pain x1 year.  Pt is also DM, reports cbg was 319.

## 2012-07-02 NOTE — ED Notes (Signed)
States that she began vomiting 2 days ago. States that she has not taken insulin since she began vomiting.

## 2012-07-04 ENCOUNTER — Encounter (HOSPITAL_COMMUNITY): Payer: Self-pay | Admitting: Emergency Medicine

## 2012-07-04 ENCOUNTER — Emergency Department (HOSPITAL_COMMUNITY)
Admission: EM | Admit: 2012-07-04 | Discharge: 2012-07-05 | Disposition: A | Discharge status: Home / Self Care | Payer: Medicaid Other | Attending: Emergency Medicine | Admitting: Emergency Medicine

## 2012-07-04 DIAGNOSIS — I2510 Atherosclerotic heart disease of native coronary artery without angina pectoris: Secondary | ICD-10-CM | POA: Insufficient documentation

## 2012-07-04 DIAGNOSIS — Z8679 Personal history of other diseases of the circulatory system: Secondary | ICD-10-CM | POA: Insufficient documentation

## 2012-07-04 DIAGNOSIS — Z8659 Personal history of other mental and behavioral disorders: Secondary | ICD-10-CM | POA: Insufficient documentation

## 2012-07-04 DIAGNOSIS — Z79899 Other long term (current) drug therapy: Secondary | ICD-10-CM | POA: Insufficient documentation

## 2012-07-04 DIAGNOSIS — E119 Type 2 diabetes mellitus without complications: Secondary | ICD-10-CM | POA: Insufficient documentation

## 2012-07-04 DIAGNOSIS — Z3202 Encounter for pregnancy test, result negative: Secondary | ICD-10-CM | POA: Insufficient documentation

## 2012-07-04 DIAGNOSIS — IMO0002 Reserved for concepts with insufficient information to code with codable children: Secondary | ICD-10-CM | POA: Insufficient documentation

## 2012-07-04 DIAGNOSIS — Z8619 Personal history of other infectious and parasitic diseases: Secondary | ICD-10-CM | POA: Insufficient documentation

## 2012-07-04 DIAGNOSIS — Z8673 Personal history of transient ischemic attack (TIA), and cerebral infarction without residual deficits: Secondary | ICD-10-CM | POA: Insufficient documentation

## 2012-07-04 DIAGNOSIS — Z8742 Personal history of other diseases of the female genital tract: Secondary | ICD-10-CM | POA: Insufficient documentation

## 2012-07-04 DIAGNOSIS — Z8639 Personal history of other endocrine, nutritional and metabolic disease: Secondary | ICD-10-CM | POA: Insufficient documentation

## 2012-07-04 DIAGNOSIS — R109 Unspecified abdominal pain: Secondary | ICD-10-CM | POA: Insufficient documentation

## 2012-07-04 DIAGNOSIS — I10 Essential (primary) hypertension: Secondary | ICD-10-CM | POA: Insufficient documentation

## 2012-07-04 DIAGNOSIS — Z8669 Personal history of other diseases of the nervous system and sense organs: Secondary | ICD-10-CM | POA: Insufficient documentation

## 2012-07-04 DIAGNOSIS — Z87448 Personal history of other diseases of urinary system: Secondary | ICD-10-CM | POA: Insufficient documentation

## 2012-07-04 DIAGNOSIS — Z8739 Personal history of other diseases of the musculoskeletal system and connective tissue: Secondary | ICD-10-CM | POA: Insufficient documentation

## 2012-07-04 DIAGNOSIS — Z862 Personal history of diseases of the blood and blood-forming organs and certain disorders involving the immune mechanism: Secondary | ICD-10-CM | POA: Insufficient documentation

## 2012-07-04 DIAGNOSIS — E669 Obesity, unspecified: Secondary | ICD-10-CM | POA: Insufficient documentation

## 2012-07-04 DIAGNOSIS — Z87891 Personal history of nicotine dependence: Secondary | ICD-10-CM | POA: Insufficient documentation

## 2012-07-04 DIAGNOSIS — G8929 Other chronic pain: Secondary | ICD-10-CM | POA: Insufficient documentation

## 2012-07-04 DIAGNOSIS — J45909 Unspecified asthma, uncomplicated: Secondary | ICD-10-CM | POA: Insufficient documentation

## 2012-07-04 DIAGNOSIS — K219 Gastro-esophageal reflux disease without esophagitis: Secondary | ICD-10-CM | POA: Insufficient documentation

## 2012-07-04 DIAGNOSIS — N390 Urinary tract infection, site not specified: Secondary | ICD-10-CM | POA: Insufficient documentation

## 2012-07-04 NOTE — ED Notes (Addendum)
Per EMS: Pt c/o 10/10 generalized abdominal pain. Pt states she has N/V. Per EMS pt vomited and was given4 mg of Zofran in route. Pt was seen on 07/02/12 for the same symptoms and was discharged.

## 2012-07-04 NOTE — ED Notes (Signed)
Bed:WA03<BR> Expected date:<BR> Expected time:<BR> Means of arrival:<BR> Comments:<BR> EMS

## 2012-07-04 NOTE — ED Notes (Signed)
Pt is vomiting in room. Pt states the zofran does not work. Pt has family at bedside.

## 2012-07-05 LAB — COMPREHENSIVE METABOLIC PANEL
ALT: 5 U/L (ref 0–35)
Albumin: 3.2 g/dL — ABNORMAL LOW (ref 3.5–5.2)
Alkaline Phosphatase: 80 U/L (ref 39–117)
BUN: 8 mg/dL (ref 6–23)
Calcium: 9.5 mg/dL (ref 8.4–10.5)
Potassium: 3.4 mEq/L — ABNORMAL LOW (ref 3.5–5.1)
Sodium: 133 mEq/L — ABNORMAL LOW (ref 135–145)
Total Protein: 8.6 g/dL — ABNORMAL HIGH (ref 6.0–8.3)

## 2012-07-05 LAB — URINE MICROSCOPIC-ADD ON

## 2012-07-05 LAB — CBC WITH DIFFERENTIAL/PLATELET
Basophils Relative: 0 % (ref 0–1)
Eosinophils Absolute: 0 10*3/uL (ref 0.0–0.7)
MCH: 22.1 pg — ABNORMAL LOW (ref 26.0–34.0)
MCHC: 34 g/dL (ref 30.0–36.0)
Neutrophils Relative %: 82 % — ABNORMAL HIGH (ref 43–77)
Platelets: 398 10*3/uL (ref 150–400)

## 2012-07-05 LAB — URINALYSIS, ROUTINE W REFLEX MICROSCOPIC
Bilirubin Urine: NEGATIVE
Leukocytes, UA: NEGATIVE
Nitrite: NEGATIVE
Specific Gravity, Urine: 1.046 — ABNORMAL HIGH (ref 1.005–1.030)
pH: 5 (ref 5.0–8.0)

## 2012-07-05 LAB — POCT PREGNANCY, URINE: Preg Test, Ur: NEGATIVE

## 2012-07-05 LAB — LIPASE, BLOOD: Lipase: 32 U/L (ref 11–59)

## 2012-07-05 MED ORDER — HYDROMORPHONE HCL PF 1 MG/ML IJ SOLN
1.0000 mg | Freq: Once | INTRAMUSCULAR | Status: AC
  Administered 2012-07-05: 1 mg via INTRAVENOUS

## 2012-07-05 MED ORDER — HYDROMORPHONE HCL PF 1 MG/ML IJ SOLN
1.0000 mg | Freq: Once | INTRAMUSCULAR | Status: AC
  Administered 2012-07-05: 1 mg via INTRAVENOUS
  Filled 2012-07-05: qty 1

## 2012-07-05 MED ORDER — ONDANSETRON HCL 4 MG/2ML IJ SOLN
4.0000 mg | Freq: Once | INTRAMUSCULAR | Status: AC
  Administered 2012-07-05: 4 mg via INTRAVENOUS
  Filled 2012-07-05: qty 2

## 2012-07-05 MED ORDER — SODIUM CHLORIDE 0.9 % IV BOLUS (SEPSIS)
1000.0000 mL | Freq: Once | INTRAVENOUS | Status: AC
  Administered 2012-07-05: 1000 mL via INTRAVENOUS

## 2012-07-05 MED ORDER — METOCLOPRAMIDE HCL 5 MG/ML IJ SOLN
10.0000 mg | Freq: Once | INTRAMUSCULAR | Status: AC
  Administered 2012-07-05: 10 mg via INTRAVENOUS
  Filled 2012-07-05: qty 2

## 2012-07-05 MED ORDER — DEXTROSE 5 % IV SOLN
1.0000 g | Freq: Once | INTRAVENOUS | Status: AC
  Administered 2012-07-05: 1 g via INTRAVENOUS
  Filled 2012-07-05: qty 10

## 2012-07-05 MED ORDER — ONDANSETRON 8 MG PO TBDP: ORAL_TABLET | ORAL | Status: DC

## 2012-07-05 MED ORDER — NITROFURANTOIN MONOHYD MACRO 100 MG PO CAPS: 100.0000 mg | ORAL_CAPSULE | Freq: Two times a day (BID) | ORAL | Status: DC

## 2012-07-05 MED ORDER — PROMETHAZINE HCL 25 MG/ML IJ SOLN
12.5000 mg | Freq: Once | INTRAMUSCULAR | Status: AC
  Administered 2012-07-05: 04:00:00 via INTRAVENOUS
  Filled 2012-07-05: qty 1

## 2012-07-05 NOTE — ED Notes (Signed)
Pt complains of nausea dizziness and abdominal pain despite multiple doses of nausea medication and pain medication. Pt sleeping when this Clinical research associate entered room. Pt family member at bedside. Pt wakes up for only a moment when asking her questions.

## 2012-07-05 NOTE — ED Notes (Signed)
Pt   Emesis has ceased.

## 2012-07-05 NOTE — ED Provider Notes (Signed)
History     CSN: [id number]  Arrival date & time 07/04/12  2235   First MD Initiated Contact with Patient 07/05/12 0012      Chief Complaint  Patient presents with  . Abdominal Pain  . Emesis   HPI  History provided by the patient and significant other. Patient is a 37 year old female with history of hypertension, hyperlipidemia, diabetes and chronic abdominal pains with cyclic nausea vomiting presents with complaints of similar typical chronic abdominal pain with multiple chills nausea vomiting throughout the day. Patient is inhaler state that nausea vomiting is common on a daily basis. Today this became much worse with increased abdominal pain similar to previous exacerbations of chronic abdominal pains. Patient has been followed by Dr. Elnoria Howard with GI without a specific cause of her chronic symptoms. Patient has been taking Prilosec and occasional Vicodin for pain and symptoms. She denies any known aggravating or alleviating factors. Denies any other associated symptoms.    Past Medical History  Diagnosis Date  . Thrombocytosis     Hem/Onc suggested 2/2 chronic hepatits and/or iron deficiency anemia  . Iron deficiency anemia   . N&V (nausea and vomiting)     Chronic. Unclear etiology with multiple admission and ED visits. CT abdomen with and without contrast (02/2011)  showed no acute process. Gastic Emptying scan (01/2010) was normal. Ultrasound of the abdomen was within normal limits. Hepatitis B viral load was undectable. HIV NR. EGD - gastritis, Hpylori + s/p Rx  . Abdominal pain   . Hypertension   . Hyperlipidemia   . Diabetes mellitus type 2, uncontrolled, with complications   . Recurrent boils   . Back pain   . OSA (obstructive sleep apnea)   . 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 Cath 12/10- no restenosis or significant CAD progression  . Irregular menses      Small ovarian follicles seen on CT(9/06)  . History of pyelonephritis     H/o GrpB Pyelonephritis (9/06) and UTI- 07/11- E.Coli, 12/10- GBS  . Abscess of tunica vaginalis     10/09- Abundant S. aureus- sensitive to all abx  . Obesity   . Asthma   . Gastritis   . Depression   . Hepatitis B, chronic     Hep BeAb+,Hep B cAb+ & Hep BsAg+ (9/06)  . Peripheral neuropathy   . Fibromyalgia   . GERD (gastroesophageal reflux disease)   . Migraine   . Anxiety   . Gastroparesis     secondary to poorly controlled DM, last emptying study performed 01/2010  was normal but may be falsely positive as pt was on reglan  . Blood dyscrasia   . H/O: CVA (cerebrovascular accident)     history of remote right cerebellar infarct noted on head CT at least since 10/2011    Past Surgical History  Procedure Laterality Date  . Cesarean section  1997    Family History  Problem Relation Age of Onset  . Diabetes Father     History  Substance Use Topics  . Smoking status: Former Smoker    Types: Cigarettes    Quit date: 04/24/1996  . Smokeless tobacco: Never Used     Comment: quit smoking cigarettes age 29  . Alcohol Use: Yes     Comment: 03/31/11 "used marijuana last ~ 2 wk ago; only drink on occasions"    OB History  Grav Para Term Preterm Abortions TAB SAB Ect Mult Living                  Review of Systems  Constitutional: Positive for appetite change. Negative for fever, chills and diaphoresis.  Respiratory: Negative for shortness of breath.   Cardiovascular: Negative for chest pain.  Gastrointestinal: Positive for nausea, vomiting and abdominal pain. Negative for diarrhea, constipation and blood in stool.  Genitourinary: Negative for dysuria, frequency, hematuria, flank pain, vaginal bleeding and vaginal discharge.  All other systems reviewed and are negative.    Allergies  Lisinopril  Home Medications   Current Outpatient Rx  Name  Route  Sig  Dispense  Refill  .  acetaminophen (TYLENOL) 500 MG tablet   Oral   Take 1,000 mg by mouth every 6 (six) hours as needed for pain.         Marland Kitchen Fluticasone-Salmeterol (ADVAIR) 100-50 MCG/DOSE AEPB   Inhalation   Inhale 1 puff into the lungs every 12 (twelve) hours.   60 each   6   . HYDROcodone-acetaminophen (NORCO/VICODIN) 5-325 MG per tablet   Oral   Take 2 tablets by mouth every 4 (four) hours as needed for pain.   10 tablet   0   . insulin glargine (LANTUS) 100 UNIT/ML injection   Subcutaneous   Inject 20 Units into the skin at bedtime.   10 mL   3   . labetalol (NORMODYNE) 100 MG tablet   Oral   Take 0.5 tablets (50 mg total) by mouth 2 (two) times daily.   60 tablet   0   . promethazine (PHENERGAN) 12.5 MG tablet   Oral   Take 2 tablets (25 mg total) by mouth every 6 (six) hours as needed for nausea.   30 tablet   0   . albuterol (PROVENTIL HFA;VENTOLIN HFA) 108 (90 BASE) MCG/ACT inhaler   Inhalation   Inhale 2 puffs into the lungs every 4 (four) hours as needed. For shortness of breath   1 Inhaler   6   . omeprazole (PRILOSEC) 40 MG capsule   Oral   Take 1 capsule (40 mg total) by mouth daily.   31 capsule   6     BP 133/109  Pulse 100  Temp(Src) 97.8 F (36.6 C) (Oral)  Resp 22  SpO2 97%  LMP 06/25/2012  Physical Exam  Nursing note and vitals reviewed. Constitutional: She is oriented to person, place, and time. She appears well-developed and well-nourished. No distress.  HENT:  Head: Normocephalic.  Cardiovascular: Normal rate and regular rhythm.   Pulmonary/Chest: Effort normal and breath sounds normal. No respiratory distress. She has no wheezes.  Abdominal: Soft. There is tenderness in the left upper quadrant and left lower quadrant. There is no rebound, no guarding, no CVA tenderness, no tenderness at McBurney's point and negative Murphy's sign.  Obese.  Musculoskeletal: Normal range of motion.  Neurological: She is alert and oriented to person, place, and  time.  Skin: Skin is warm and dry. No rash noted.  Psychiatric: She has a normal mood and affect. Her behavior is normal.    ED Course  Procedures   Results for orders placed during the hospital encounter of 07/04/12  CBC WITH DIFFERENTIAL      Result Value Range   WBC 13.8 (*) 4.0 - 10.5 K/uL   RBC 5.43 (*) 3.87 - 5.11 MIL/uL   Hemoglobin 12.0  12.0 - 15.0 g/dL   HCT 45.4 (*) 09.8 -  46.0 %   MCV 65.0 (*) 78.0 - 100.0 fL   MCH 22.1 (*) 26.0 - 34.0 pg   MCHC 34.0  30.0 - 36.0 g/dL   RDW 40.9 (*) 81.1 - 91.4 %   Platelets 398  150 - 400 K/uL   Neutrophils Relative 82 (*) 43 - 77 %   Neutro Abs 11.4 (*) 1.7 - 7.7 K/uL   Lymphocytes Relative 14  12 - 46 %   Lymphs Abs 1.9  0.7 - 4.0 K/uL   Monocytes Relative 4  3 - 12 %   Monocytes Absolute 0.5  0.1 - 1.0 K/uL   Eosinophils Relative 0  0 - 5 %   Eosinophils Absolute 0.0  0.0 - 0.7 K/uL   Basophils Relative 0  0 - 1 %   Basophils Absolute 0.0  0.0 - 0.1 K/uL  COMPREHENSIVE METABOLIC PANEL      Result Value Range   Sodium 133 (*) 135 - 145 mEq/L   Potassium 3.4 (*) 3.5 - 5.1 mEq/L   Chloride 95 (*) 96 - 112 mEq/L   CO2 22  19 - 32 mEq/L   Glucose, Bld 284 (*) 70 - 99 mg/dL   BUN 8  6 - 23 mg/dL   Creatinine, Ser 7.82  0.50 - 1.10 mg/dL   Calcium 9.5  8.4 - 95.6 mg/dL   Total Protein 8.6 (*) 6.0 - 8.3 g/dL   Albumin 3.2 (*) 3.5 - 5.2 g/dL   AST 11  0 - 37 U/L   ALT 5  0 - 35 U/L   Alkaline Phosphatase 80  39 - 117 U/L   Total Bilirubin 0.4  0.3 - 1.2 mg/dL   GFR calc non Af Amer >90  >90 mL/min   GFR calc Af Amer >90  >90 mL/min  LIPASE, BLOOD      Result Value Range   Lipase 32  11 - 59 U/L  URINALYSIS, ROUTINE W REFLEX MICROSCOPIC      Result Value Range   Color, Urine YELLOW  YELLOW   APPearance CLOUDY (*) CLEAR   Specific Gravity, Urine 1.046 (*) 1.005 - 1.030   pH 5.0  5.0 - 8.0   Glucose, UA >1000 (*) NEGATIVE mg/dL   Hgb urine dipstick NEGATIVE  NEGATIVE   Bilirubin Urine NEGATIVE  NEGATIVE   Ketones, ur  >80 (*) NEGATIVE mg/dL   Protein, ur 30 (*) NEGATIVE mg/dL   Urobilinogen, UA 0.2  0.0 - 1.0 mg/dL   Nitrite NEGATIVE  NEGATIVE   Leukocytes, UA NEGATIVE  NEGATIVE  URINE MICROSCOPIC-ADD ON      Result Value Range   Squamous Epithelial / LPF FEW (*) RARE   WBC, UA 7-10  <3 WBC/hpf   RBC / HPF 0-2  <3 RBC/hpf   Bacteria, UA MANY (*) RARE   Urine-Other MUCOUS PRESENT    POCT PREGNANCY, URINE      Result Value Range   Preg Test, Ur NEGATIVE  NEGATIVE         1. Chronic abdominal pain   2. Nausea & vomiting   3. UTI (lower urinary tract infection)       MDM  Patient seen and evaluated. Patient lying on right side and rocking in bed appears uncomfortable.  Patient with multiple prior visits for similar symptoms. Labs without significant changes and similar to previous visits. There is a signs for UTI.  Patient now having some symptoms. She has received IV fluids for dehydration and slightly elevated  blood sugar. At this time patient feeling ready to return home. Will discharged with antibiotics and medicine for nausea vomiting symptoms.        Angus Seller, PA-C 07/05/12 [id number]

## 2012-07-05 NOTE — ED Notes (Signed)
Pt states she has had this in the past and she has a history of acid reflux but she does not take anything on a daily basis for this problem. Pt currently vomiting at this time

## 2012-07-05 NOTE — ED Provider Notes (Signed)
Medical screening examination/treatment/procedure(s) were performed by non-physician practitioner and as supervising physician I was immediately available for consultation/collaboration.  April K Palumbo-Rasch, MD 07/05/12 0625 

## 2012-07-06 LAB — URINE CULTURE: Colony Count: >=100000

## 2012-07-07 ENCOUNTER — Telehealth (HOSPITAL_COMMUNITY): Payer: Self-pay | Admitting: Emergency Medicine

## 2012-07-07 ENCOUNTER — Emergency Department (HOSPITAL_COMMUNITY)
Admission: EM | Admit: 2012-07-07 | Discharge: 2012-07-07 | Disposition: A | Discharge status: Home / Self Care | Payer: Medicaid Other | Attending: Emergency Medicine | Admitting: Emergency Medicine

## 2012-07-07 ENCOUNTER — Encounter (HOSPITAL_COMMUNITY): Payer: Self-pay | Admitting: Emergency Medicine

## 2012-07-07 DIAGNOSIS — Z862 Personal history of diseases of the blood and blood-forming organs and certain disorders involving the immune mechanism: Secondary | ICD-10-CM | POA: Insufficient documentation

## 2012-07-07 DIAGNOSIS — Z794 Long term (current) use of insulin: Secondary | ICD-10-CM | POA: Insufficient documentation

## 2012-07-07 DIAGNOSIS — G8929 Other chronic pain: Secondary | ICD-10-CM | POA: Insufficient documentation

## 2012-07-07 DIAGNOSIS — F1410 Cocaine abuse, uncomplicated: Secondary | ICD-10-CM | POA: Insufficient documentation

## 2012-07-07 DIAGNOSIS — Z8669 Personal history of other diseases of the nervous system and sense organs: Secondary | ICD-10-CM | POA: Insufficient documentation

## 2012-07-07 DIAGNOSIS — Z8742 Personal history of other diseases of the female genital tract: Secondary | ICD-10-CM | POA: Insufficient documentation

## 2012-07-07 DIAGNOSIS — R1032 Left lower quadrant pain: Secondary | ICD-10-CM | POA: Insufficient documentation

## 2012-07-07 DIAGNOSIS — IMO0002 Reserved for concepts with insufficient information to code with codable children: Secondary | ICD-10-CM | POA: Insufficient documentation

## 2012-07-07 DIAGNOSIS — I2510 Atherosclerotic heart disease of native coronary artery without angina pectoris: Secondary | ICD-10-CM | POA: Insufficient documentation

## 2012-07-07 DIAGNOSIS — Z8659 Personal history of other mental and behavioral disorders: Secondary | ICD-10-CM | POA: Insufficient documentation

## 2012-07-07 DIAGNOSIS — Z8673 Personal history of transient ischemic attack (TIA), and cerebral infarction without residual deficits: Secondary | ICD-10-CM | POA: Insufficient documentation

## 2012-07-07 DIAGNOSIS — Z8739 Personal history of other diseases of the musculoskeletal system and connective tissue: Secondary | ICD-10-CM | POA: Insufficient documentation

## 2012-07-07 DIAGNOSIS — J45909 Unspecified asthma, uncomplicated: Secondary | ICD-10-CM | POA: Insufficient documentation

## 2012-07-07 DIAGNOSIS — G4733 Obstructive sleep apnea (adult) (pediatric): Secondary | ICD-10-CM | POA: Insufficient documentation

## 2012-07-07 DIAGNOSIS — Z8719 Personal history of other diseases of the digestive system: Secondary | ICD-10-CM | POA: Insufficient documentation

## 2012-07-07 DIAGNOSIS — Z79899 Other long term (current) drug therapy: Secondary | ICD-10-CM | POA: Insufficient documentation

## 2012-07-07 DIAGNOSIS — E669 Obesity, unspecified: Secondary | ICD-10-CM | POA: Insufficient documentation

## 2012-07-07 DIAGNOSIS — Z87448 Personal history of other diseases of urinary system: Secondary | ICD-10-CM | POA: Insufficient documentation

## 2012-07-07 DIAGNOSIS — K219 Gastro-esophageal reflux disease without esophagitis: Secondary | ICD-10-CM | POA: Insufficient documentation

## 2012-07-07 DIAGNOSIS — Z87891 Personal history of nicotine dependence: Secondary | ICD-10-CM | POA: Insufficient documentation

## 2012-07-07 DIAGNOSIS — I10 Essential (primary) hypertension: Secondary | ICD-10-CM | POA: Insufficient documentation

## 2012-07-07 DIAGNOSIS — R079 Chest pain, unspecified: Secondary | ICD-10-CM | POA: Insufficient documentation

## 2012-07-07 DIAGNOSIS — Z8639 Personal history of other endocrine, nutritional and metabolic disease: Secondary | ICD-10-CM | POA: Insufficient documentation

## 2012-07-07 DIAGNOSIS — Z8619 Personal history of other infectious and parasitic diseases: Secondary | ICD-10-CM | POA: Insufficient documentation

## 2012-07-07 DIAGNOSIS — Z8679 Personal history of other diseases of the circulatory system: Secondary | ICD-10-CM | POA: Insufficient documentation

## 2012-07-07 DIAGNOSIS — E119 Type 2 diabetes mellitus without complications: Secondary | ICD-10-CM | POA: Insufficient documentation

## 2012-07-07 LAB — CBC WITH DIFFERENTIAL/PLATELET
Basophils Absolute: 0.1 10*3/uL (ref 0.0–0.1)
Lymphocytes Relative: 23 % (ref 12–46)
Lymphs Abs: 3.1 10*3/uL (ref 0.7–4.0)
Neutro Abs: 9.2 10*3/uL — ABNORMAL HIGH (ref 1.7–7.7)
Neutrophils Relative %: 68 % (ref 43–77)
Platelets: 391 10*3/uL (ref 150–400)
RBC: 4.99 MIL/uL (ref 3.87–5.11)
RDW: 18.2 % — ABNORMAL HIGH (ref 11.5–15.5)
WBC: 13.5 10*3/uL — ABNORMAL HIGH (ref 4.0–10.5)

## 2012-07-07 LAB — COMPREHENSIVE METABOLIC PANEL
ALT: 5 U/L (ref 0–35)
AST: 9 U/L (ref 0–37)
Alkaline Phosphatase: 66 U/L (ref 39–117)
CO2: 24 mEq/L (ref 19–32)
GFR calc Af Amer: >90 mL/min (ref >90)
GFR calc non Af Amer: >90 mL/min (ref >90)
Glucose, Bld: 272 mg/dL — ABNORMAL HIGH (ref 70–99)
Potassium: 3.9 mEq/L (ref 3.5–5.1)
Sodium: 133 mEq/L — ABNORMAL LOW (ref 135–145)

## 2012-07-07 LAB — URINALYSIS, ROUTINE W REFLEX MICROSCOPIC
Hgb urine dipstick: NEGATIVE
Ketones, ur: >80 mg/dL — AB
Nitrite: NEGATIVE
Specific Gravity, Urine: 1.038 — ABNORMAL HIGH (ref 1.005–1.030)
Urobilinogen, UA: 1 mg/dL (ref 0.0–1.0)
pH: 5.5 (ref 5.0–8.0)

## 2012-07-07 LAB — URINE MICROSCOPIC-ADD ON

## 2012-07-07 LAB — POCT I-STAT TROPONIN I: Troponin i, poc: 0 ng/mL (ref 0.00–0.08)

## 2012-07-07 MED ORDER — MORPHINE SULFATE 4 MG/ML IJ SOLN
4.0000 mg | Freq: Once | INTRAMUSCULAR | Status: AC
  Administered 2012-07-07: 4 mg via INTRAVENOUS
  Filled 2012-07-07: qty 1

## 2012-07-07 MED ORDER — ONDANSETRON HCL 4 MG/2ML IJ SOLN
4.0000 mg | Freq: Once | INTRAMUSCULAR | Status: AC
  Administered 2012-07-07: 4 mg via INTRAVENOUS
  Filled 2012-07-07: qty 2

## 2012-07-07 MED ORDER — PROMETHAZINE HCL 25 MG RE SUPP: 25.0000 mg | Freq: Four times a day (QID) | RECTAL | Status: DC | PRN

## 2012-07-07 MED ORDER — SODIUM CHLORIDE 0.9 % IV BOLUS (SEPSIS)
1000.0000 mL | Freq: Once | INTRAVENOUS | Status: AC
  Administered 2012-07-07: 1000 mL via INTRAVENOUS

## 2012-07-07 MED ORDER — PROMETHAZINE HCL 25 MG/ML IJ SOLN
25.0000 mg | Freq: Once | INTRAMUSCULAR | Status: AC
  Administered 2012-07-07: 25 mg via INTRAVENOUS
  Filled 2012-07-07: qty 1

## 2012-07-07 NOTE — ED Provider Notes (Signed)
History     CSN: [id number]  Arrival date & time 07/07/12  0028   First MD Initiated Contact with Patient 07/07/12 0120      Chief Complaint  Patient presents with  . Abdominal Pain    (Consider location/radiation/quality/duration/timing/severity/associated sxs/prior treatment) HPI History provided by pt.   Pt presents w/ constant, severe, non-radiating LLQ pain and N/V x 2 days.  Denies fever, diarrhea, hematemesis/hematochezia/melena and GU sx.  Per prior chart, pt is seen for these symptoms in the ED frequently, most recently 2 days ago.  She reports that her current sx are typical and unrelieved by vicodin and all anti-emetics that have been prescribed.  She is not tolerating pos. Was admitted for same last month and Dr. Elnoria Howard consulted.  He believes sx are most likely secondary to diabetic gastroparesis and he recommended outpatient gastric emptying study.  He also recommended CT of head to r/o cerebral source of N/V, and this was negative.  Pt denies fever, hematemesis/hematochezia/melena and GU sx.  She also c/o intermittent, non-exertional, non-radiating tightness at right sternal border since yesterday.  Used cocaine 2 days ago.  Per prior chart, has h/o CAD w/ balloon angioplasty in 2008.  Most recent cath in 03/2009 which showed nml coronary arteries.   Past Medical History  Diagnosis Date  . Thrombocytosis     Hem/Onc suggested 2/2 chronic hepatits and/or iron deficiency anemia  . Iron deficiency anemia   . N&V (nausea and vomiting)     Chronic. Unclear etiology with multiple admission and ED visits. CT abdomen with and without contrast (02/2011)  showed no acute process. Gastic Emptying scan (01/2010) was normal. Ultrasound of the abdomen was within normal limits. Hepatitis B viral load was undectable. HIV NR. EGD - gastritis, Hpylori + s/p Rx  . Abdominal pain   . Hypertension   . Hyperlipidemia   . Diabetes mellitus type 2, uncontrolled, with complications   . Recurrent  boils   . Back pain   . OSA (obstructive sleep apnea)   . 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 Cath 12/10- no restenosis or significant CAD progression  . Irregular menses     Small ovarian follicles seen on CT(9/06)  . History of pyelonephritis     H/o GrpB Pyelonephritis (9/06) and UTI- 07/11- E.Coli, 12/10- GBS  . Abscess of tunica vaginalis     10/09- Abundant S. aureus- sensitive to all abx  . Obesity   . Asthma   . Gastritis   . Depression   . Hepatitis B, chronic     Hep BeAb+,Hep B cAb+ & Hep BsAg+ (9/06)  . Peripheral neuropathy   . Fibromyalgia   . GERD (gastroesophageal reflux disease)   . Migraine   . Anxiety   . Gastroparesis     secondary to poorly controlled DM, last emptying study performed 01/2010  was normal but may be falsely positive as pt was on reglan  . Blood dyscrasia   . H/O: CVA (cerebrovascular accident)     history of remote right cerebellar infarct noted on head CT at least since 10/2011    Past Surgical History  Procedure Laterality Date  . Cesarean section  1997    Family History  Problem Relation Age of Onset  . Diabetes Father     History  Substance Use Topics  . Smoking status: Former Smoker    Types:  Cigarettes    Quit date: 04/24/1996  . Smokeless tobacco: Never Used     Comment: quit smoking cigarettes age 71  . Alcohol Use: Yes     Comment: 03/31/11 "used marijuana last ~ 2 wk ago; only drink on occasions"    OB History   Grav Para Term Preterm Abortions TAB SAB Ect Mult Living                  Review of Systems  All other systems reviewed and are negative.    Allergies  Lisinopril  Home Medications   Current Outpatient Rx  Name  Route  Sig  Dispense  Refill  . acetaminophen (TYLENOL) 500 MG tablet   Oral   Take 1,000 mg by mouth every 6 (six) hours as needed for pain.         Marland Kitchen albuterol  (PROVENTIL HFA;VENTOLIN HFA) 108 (90 BASE) MCG/ACT inhaler   Inhalation   Inhale 2 puffs into the lungs every 4 (four) hours as needed. For shortness of breath   1 Inhaler   6   . Fluticasone-Salmeterol (ADVAIR) 100-50 MCG/DOSE AEPB   Inhalation   Inhale 1 puff into the lungs every 12 (twelve) hours.   60 each   6   . HYDROcodone-acetaminophen (NORCO/VICODIN) 5-325 MG per tablet   Oral   Take 2 tablets by mouth every 4 (four) hours as needed for pain.   10 tablet   0   . labetalol (NORMODYNE) 100 MG tablet   Oral   Take 0.5 tablets (50 mg total) by mouth 2 (two) times daily.   60 tablet   0   . nitrofurantoin, macrocrystal-monohydrate, (MACROBID) 100 MG capsule   Oral   Take 1 capsule (100 mg total) by mouth 2 (two) times daily. X 7 days   14 capsule   0   . omeprazole (PRILOSEC) 40 MG capsule   Oral   Take 1 capsule (40 mg total) by mouth daily.   31 capsule   6   . promethazine (PHENERGAN) 12.5 MG tablet   Oral   Take 2 tablets (25 mg total) by mouth every 6 (six) hours as needed for nausea.   30 tablet   0   . insulin glargine (LANTUS) 100 UNIT/ML injection   Subcutaneous   Inject 20 Units into the skin at bedtime.   10 mL   3     BP 143/94  Pulse 129  Temp(Src) 97.9 F (36.6 C) (Oral)  Resp 16  SpO2 98%  LMP 06/25/2012  Physical Exam  Nursing note and vitals reviewed. Constitutional: She is oriented to person, place, and time. She appears well-developed and well-nourished. No distress.  HENT:  Head: Normocephalic and atraumatic.  Eyes:  Normal appearance  Neck: Normal range of motion.  Cardiovascular: Normal rate and regular rhythm.   Pulmonary/Chest: Effort normal and breath sounds normal. No respiratory distress.  Abdominal: Soft. Bowel sounds are normal. She exhibits no distension and no mass. There is no rebound and no guarding.  Obese.  Entire left side of abd ttp.   Genitourinary:  No CVA tenderness  Musculoskeletal: Normal range of  motion.  Neurological: She is alert and oriented to person, place, and time.  Skin: Skin is warm and dry. No rash noted.  Psychiatric: She has a normal mood and affect. Her behavior is normal.    ED Course  Procedures (including critical care time)   Date: 07/07/2012  Rate: 111  Rhythm: sinus tachycardia  QRS Axis: normal  Intervals: normal  ST/T Wave abnormalities: normal  Conduction Disutrbances:none  Narrative Interpretation:   Old EKG Reviewed: none available   Labs Reviewed  CBC WITH DIFFERENTIAL - Abnormal; Notable for the following:    WBC 13.5 (*)    Hemoglobin 10.8 (*)    HCT 33.0 (*)    MCV 66.1 (*)    MCH 21.6 (*)    RDW 18.2 (*)    Neutro Abs 9.2 (*)    Monocytes Absolute 1.1 (*)    All other components within normal limits  COMPREHENSIVE METABOLIC PANEL - Abnormal; Notable for the following:    Sodium 133 (*)    Glucose, Bld 272 (*)    Albumin 3.1 (*)    All other components within normal limits  URINALYSIS, ROUTINE W REFLEX MICROSCOPIC - Abnormal; Notable for the following:    APPearance TURBID (*)    Specific Gravity, Urine 1.038 (*)    Glucose, UA >1000 (*)    Bilirubin Urine SMALL (*)    Ketones, ur >80 (*)    All other components within normal limits  URINE MICROSCOPIC-ADD ON - Abnormal; Notable for the following:    Squamous Epithelial / LPF FEW (*)    All other components within normal limits   No results found.   1. Chronic abdominal pain   2. Chest pain       MDM  36yo F presents w/ LLQ pain and N/V.  Symptoms are chronic and have thought to be secondary to gastroparesis, she is seen in ED w/ same frequently, is followed by GI and during most recent admission in 05/2012, outpatient gastric emptying study recommended by Dr. Elnoria Howard.   Pt also c/o CP.  Most recently used cocaine 2 days ago.  Cardiac cath in 03/2011 showed nml coronaries.  On exam, afebrile, tachycardic, abd soft and non-distended but diffuse left-sided ttp.  Labs sig for  ketonuria and negative troponin.  EKG non-ischemic.  Pt receiving second liter bolus, pain mildly improved w/ morphine and no active vomiting in ED.  Will po challenge.   4:19 AM   VSS.  Tolerating pos.  Pain and nausea improved.  She requested prescription for pain medication but I explained that I was not comfortable w/ treating her with narcotics when she abuses cocaine. Prescribed phenergan and advised close f/u with GI.  Return precautions discussed.         Otilio Miu, PA-C 07/08/12 [phone]

## 2012-07-07 NOTE — ED Notes (Signed)
+  Urine. Patient given Macrobid. No sensitivities listed. Chart sent to EDP office for review. °

## 2012-07-07 NOTE — ED Notes (Signed)
Patient has +Urine culture. Checking to see if treated appropriately. °

## 2012-07-07 NOTE — ED Notes (Signed)
Pt alert, arrives from home, c/o lower abd pain, describes pain as sharp, non radiating, emesis pta, onset "the other day", resp even unlabored, skin pwd

## 2012-07-08 NOTE — ED Provider Notes (Signed)
Medical screening examination/treatment/procedure(s) were performed by non-physician practitioner and as supervising physician I was immediately available for consultation/collaboration.  Lyanne Co, MD 07/08/12 0800

## 2012-07-11 NOTE — ED Notes (Signed)
Patient is ok.

## 2012-07-15 ENCOUNTER — Encounter: Payer: Self-pay | Admitting: Internal Medicine

## 2012-07-15 ENCOUNTER — Ambulatory Visit (INDEPENDENT_AMBULATORY_CARE_PROVIDER_SITE_OTHER): Payer: Medicaid Other | Admitting: Internal Medicine

## 2012-07-15 VITALS — BP 145/91 | HR 117 | Temp 98.2°F | Ht 67.0 in | Wt 221.4 lb

## 2012-07-15 DIAGNOSIS — R109 Unspecified abdominal pain: Secondary | ICD-10-CM

## 2012-07-15 DIAGNOSIS — E1165 Type 2 diabetes mellitus with hyperglycemia: Secondary | ICD-10-CM

## 2012-07-15 DIAGNOSIS — R7309 Other abnormal glucose: Secondary | ICD-10-CM

## 2012-07-15 DIAGNOSIS — N390 Urinary tract infection, site not specified: Secondary | ICD-10-CM

## 2012-07-15 DIAGNOSIS — F1910 Other psychoactive substance abuse, uncomplicated: Secondary | ICD-10-CM

## 2012-07-15 DIAGNOSIS — E785 Hyperlipidemia, unspecified: Secondary | ICD-10-CM

## 2012-07-15 DIAGNOSIS — R739 Hyperglycemia, unspecified: Secondary | ICD-10-CM

## 2012-07-15 DIAGNOSIS — E119 Type 2 diabetes mellitus without complications: Secondary | ICD-10-CM

## 2012-07-15 DIAGNOSIS — I10 Essential (primary) hypertension: Secondary | ICD-10-CM

## 2012-07-15 LAB — LIPID PANEL
Cholesterol: 184 mg/dL (ref 0–200)
HDL: 38 mg/dL — ABNORMAL LOW
LDL Cholesterol: 90 mg/dL (ref 0–99)
Total CHOL/HDL Ratio: 4.8 ratio
Triglycerides: 280 mg/dL — ABNORMAL HIGH
VLDL: 56 mg/dL — ABNORMAL HIGH (ref 0–40)

## 2012-07-15 LAB — BASIC METABOLIC PANEL
BUN: 11 mg/dL (ref 6–23)
Calcium: 9.3 mg/dL (ref 8.4–10.5)
Chloride: 95 mEq/L — ABNORMAL LOW (ref 96–112)
Creat: 0.76 mg/dL (ref 0.50–1.10)

## 2012-07-15 LAB — GLUCOSE, CAPILLARY
Glucose-Capillary: >600 mg/dL (ref 70–99)
Glucose-Capillary: >600 mg/dL (ref 70–99)

## 2012-07-15 LAB — POCT GLYCOSYLATED HEMOGLOBIN (HGB A1C): Hemoglobin A1C: >14

## 2012-07-15 MED ORDER — INSULIN GLARGINE 100 UNIT/ML ~~LOC~~ SOLN: 40.0000 [IU] | Freq: Every day | SUBCUTANEOUS | Status: DC

## 2012-07-15 MED ORDER — TRAMADOL HCL 50 MG PO TABS: 50.0000 mg | ORAL_TABLET | Freq: Four times a day (QID) | ORAL | Status: DC | PRN

## 2012-07-15 NOTE — Progress Notes (Signed)
  Subjective:    Patient ID: Shirley [name], female    DOB: 11/23/1975, 37 y.o.   MRN: [id number]  HPI Pt presents for post ED f/u for chronic abdominal pain. Found to have UTi and prescribed Macrobid with recommendation to f/u with GI Dr. Elnoria Howard for repeat gastric emptying study. Pt reports that she has not been able to get any medications and has done w/o for ~ 3 weeks.  Blood glucose today at critical value in clinic, states that she had a cup of sweet Kool-Aid soft drink before coming to clinic. Complains of continues nausea and vomited x1 today.   Review of Systems  Constitutional: Negative for fever.  HENT: Negative for congestion.   Eyes: Negative for visual disturbance.  Respiratory: Negative for shortness of breath.   Cardiovascular: Negative for chest pain.  Gastrointestinal: Positive for nausea and vomiting.  Endocrine: Positive for polydipsia and polyuria.  Genitourinary: Negative for difficulty urinating.  Skin: Negative for rash.       Objective:   Physical Exam  Constitutional: She is oriented to person, place, and time. She appears well-developed and well-nourished. No distress.  HENT:  Head: Normocephalic and atraumatic.  Eyes: Conjunctivae and EOM are normal. Pupils are equal, round, and reactive to light.  Neck: Normal range of motion. Neck supple. No thyromegaly present.  Cardiovascular: Normal heart sounds.  Tachycardia present.   Pulmonary/Chest: Effort normal and breath sounds normal.  Abdominal: Soft. Bowel sounds are normal. There is no tenderness.  Musculoskeletal: She exhibits no edema.  Neurological: She is alert and oriented to person, place, and time.  Skin: Skin is warm and dry.  Psychiatric: She has a normal mood and affect.          Assessment & Plan:  1.Acute on  Chronic Abdominal pain: likely secondary to gastroparesis in setting of UTI -pt states mentor Mr. Shirley [name] of "[employer]"  Who pt reports is a [employer] in [place] may be able to get her medications for her -scheduled appt with Dr. Daleen Squibb 07/17/2012 for likely repeat gastic emptying study -gave Tramadol for pain, deferred stronger opioid giving substance abuse hx and likely gastroparesis  2. DM Type 2, poorly controlled: associated with gastroparesis and neuropathy, HgbA1c >14, pt w/o insulin for 3 weeks -gave sample of Lantus today and administered 40 units in exam room -stat BMET--->>  No AG (12)  -foot exam today -check urine microalbumin  3. Hyperlipidemia: check lipid panel today, currently not on statin  4. Preventative Care: declines flu shot, pneumo shot and pap smear today

## 2012-07-15 NOTE — Patient Instructions (Addendum)
General Instructions:  We have given you a sample of Lantus.  Take as directed. Contact your mentor to see if he can assist you with getting your medications. Keep your appointment with Dr. Elnoria Howard this Wednesday.  This is very important. Keep your eye appoinement as well. Follow-up in 3 months.  Treatment Goals:  Goals (1 Years of Data) as of 07/15/12         As of Today 07/07/12 07/07/12 07/07/12 07/05/12     Blood Pressure    . Blood Pressure < 140/90  145/91 121/70 133/83 143/94 135/91     Result Component    . HEMOGLOBIN A1C < 7.0  >14.0        . LDL CALC < 100            Progress Toward Treatment Goals:  Treatment Goal 07/15/2012  Blood pressure deteriorated    Self Care Goals & Plans:  Self Care Goal 07/15/2012  Manage my medications take my medicines as prescribed; bring my medications to every visit; refill my medications on time  Eat healthy foods drink diet soda or water instead of juice or soda       Care Management & Community Referrals:

## 2012-07-15 NOTE — Assessment & Plan Note (Signed)
Lab Results  Component Value Date   HGBA1C >14.0 01/22/2012   HGBA1C 11.0* 03/04/2011   HGBA1C 8.7 09/27/2010     Assessment:  Diabetes control:    Progress toward A1C goal:     Comments:   Plan:  Medications:  continue current medications  Home glucose monitoring:   Frequency:     Timing:    Instruction/counseling given: reminded to get eye exam, discussed foot care and discussed diet  Educational resources provided: brochure  Self management tools provided:    Other plans: foot exam today

## 2012-07-15 NOTE — Assessment & Plan Note (Signed)
BP Readings from Last 3 Encounters:  07/15/12 145/91  07/07/12 121/70  07/05/12 135/91    Lab Results  Component Value Date   NA 133* 07/07/2012   K 3.9 07/07/2012   CREATININE 0.74 07/07/2012    Assessment:  Blood pressure control: mildly elevated  Progress toward BP goal:  deteriorated  Comments:   Plan:  Medications:  continue current medications  Educational resources provided: brochure  Self management tools provided:    Other plans:

## 2012-07-16 ENCOUNTER — Encounter (HOSPITAL_COMMUNITY): Payer: Self-pay | Admitting: Emergency Medicine

## 2012-07-16 ENCOUNTER — Emergency Department (HOSPITAL_COMMUNITY)
Admission: EM | Admit: 2012-07-16 | Discharge: 2012-07-16 | Disposition: A | Discharge status: Home / Self Care | Payer: Medicaid Other | Attending: Emergency Medicine | Admitting: Emergency Medicine

## 2012-07-16 DIAGNOSIS — Z862 Personal history of diseases of the blood and blood-forming organs and certain disorders involving the immune mechanism: Secondary | ICD-10-CM | POA: Insufficient documentation

## 2012-07-16 DIAGNOSIS — R1032 Left lower quadrant pain: Secondary | ICD-10-CM | POA: Insufficient documentation

## 2012-07-16 DIAGNOSIS — R112 Nausea with vomiting, unspecified: Secondary | ICD-10-CM | POA: Insufficient documentation

## 2012-07-16 DIAGNOSIS — Z87448 Personal history of other diseases of urinary system: Secondary | ICD-10-CM | POA: Insufficient documentation

## 2012-07-16 DIAGNOSIS — Z8719 Personal history of other diseases of the digestive system: Secondary | ICD-10-CM | POA: Insufficient documentation

## 2012-07-16 DIAGNOSIS — E119 Type 2 diabetes mellitus without complications: Secondary | ICD-10-CM | POA: Insufficient documentation

## 2012-07-16 DIAGNOSIS — Z8673 Personal history of transient ischemic attack (TIA), and cerebral infarction without residual deficits: Secondary | ICD-10-CM | POA: Insufficient documentation

## 2012-07-16 DIAGNOSIS — R109 Unspecified abdominal pain: Secondary | ICD-10-CM

## 2012-07-16 DIAGNOSIS — Z8742 Personal history of other diseases of the female genital tract: Secondary | ICD-10-CM | POA: Insufficient documentation

## 2012-07-16 DIAGNOSIS — Z8669 Personal history of other diseases of the nervous system and sense organs: Secondary | ICD-10-CM | POA: Insufficient documentation

## 2012-07-16 DIAGNOSIS — Z79899 Other long term (current) drug therapy: Secondary | ICD-10-CM | POA: Insufficient documentation

## 2012-07-16 DIAGNOSIS — Z794 Long term (current) use of insulin: Secondary | ICD-10-CM | POA: Insufficient documentation

## 2012-07-16 DIAGNOSIS — Z8659 Personal history of other mental and behavioral disorders: Secondary | ICD-10-CM | POA: Insufficient documentation

## 2012-07-16 DIAGNOSIS — E669 Obesity, unspecified: Secondary | ICD-10-CM | POA: Insufficient documentation

## 2012-07-16 DIAGNOSIS — IMO0002 Reserved for concepts with insufficient information to code with codable children: Secondary | ICD-10-CM | POA: Insufficient documentation

## 2012-07-16 DIAGNOSIS — K219 Gastro-esophageal reflux disease without esophagitis: Secondary | ICD-10-CM | POA: Insufficient documentation

## 2012-07-16 DIAGNOSIS — I2510 Atherosclerotic heart disease of native coronary artery without angina pectoris: Secondary | ICD-10-CM | POA: Insufficient documentation

## 2012-07-16 DIAGNOSIS — I10 Essential (primary) hypertension: Secondary | ICD-10-CM | POA: Insufficient documentation

## 2012-07-16 DIAGNOSIS — Z8619 Personal history of other infectious and parasitic diseases: Secondary | ICD-10-CM | POA: Insufficient documentation

## 2012-07-16 DIAGNOSIS — Z87891 Personal history of nicotine dependence: Secondary | ICD-10-CM | POA: Insufficient documentation

## 2012-07-16 DIAGNOSIS — Z8639 Personal history of other endocrine, nutritional and metabolic disease: Secondary | ICD-10-CM | POA: Insufficient documentation

## 2012-07-16 DIAGNOSIS — J45909 Unspecified asthma, uncomplicated: Secondary | ICD-10-CM | POA: Insufficient documentation

## 2012-07-16 DIAGNOSIS — G4733 Obstructive sleep apnea (adult) (pediatric): Secondary | ICD-10-CM | POA: Insufficient documentation

## 2012-07-16 DIAGNOSIS — Z8679 Personal history of other diseases of the circulatory system: Secondary | ICD-10-CM | POA: Insufficient documentation

## 2012-07-16 LAB — MICROALBUMIN / CREATININE URINE RATIO
Creatinine, Urine: 18.2 mg/dL
Microalb Creat Ratio: 27.5 mg/g (ref 0.0–30.0)
Microalb, Ur: 0.5 mg/dL (ref 0.00–1.89)

## 2012-07-16 MED ORDER — PANTOPRAZOLE SODIUM 40 MG IV SOLR
40.0000 mg | Freq: Once | INTRAVENOUS | Status: AC
  Administered 2012-07-16: 40 mg via INTRAVENOUS
  Filled 2012-07-16: qty 40

## 2012-07-16 MED ORDER — TRAMADOL HCL 50 MG PO TABS
50.0000 mg | ORAL_TABLET | Freq: Once | ORAL | Status: AC
  Administered 2012-07-16: 50 mg via ORAL
  Filled 2012-07-16: qty 1

## 2012-07-16 MED ORDER — ONDANSETRON HCL 4 MG/2ML IJ SOLN
4.0000 mg | Freq: Once | INTRAMUSCULAR | Status: AC
  Administered 2012-07-16: 4 mg via INTRAVENOUS
  Filled 2012-07-16: qty 2

## 2012-07-16 MED ORDER — PROMETHAZINE HCL 25 MG RE SUPP: 25.0000 mg | Freq: Four times a day (QID) | RECTAL | Status: DC | PRN

## 2012-07-16 MED ORDER — KETOROLAC TROMETHAMINE 30 MG/ML IJ SOLN
30.0000 mg | Freq: Once | INTRAMUSCULAR | Status: AC
  Administered 2012-07-16: 30 mg via INTRAVENOUS
  Filled 2012-07-16: qty 1

## 2012-07-16 MED ORDER — SODIUM CHLORIDE 0.9 % IV BOLUS (SEPSIS)
1000.0000 mL | Freq: Once | INTRAVENOUS | Status: AC
  Administered 2012-07-16: 1000 mL via INTRAVENOUS

## 2012-07-16 MED ORDER — PROMETHAZINE HCL 25 MG/ML IJ SOLN
25.0000 mg | Freq: Once | INTRAMUSCULAR | Status: AC
  Administered 2012-07-16: 25 mg via INTRAMUSCULAR
  Filled 2012-07-16: qty 1

## 2012-07-16 NOTE — ED Notes (Signed)
Pt refusing to leave until she talks to the doctor. Nurse explained to pt that she has been given medicines for both nausea and pain and a prescription to go home with that will help her rest and help with her nausea. Pt and visitor were not satisfied. Pt currently lying on stomach, flailing about in bed.

## 2012-07-16 NOTE — ED Provider Notes (Signed)
History     CSN: [id number]  Arrival date & time 07/16/12  0000   First MD Initiated Contact with Patient 07/16/12 0145      Chief Complaint  Patient presents with  . Emesis  . Abdominal Pain    (Consider location/radiation/quality/duration/timing/severity/associated sxs/prior treatment) HPI History provided by patient. Abdominal pain and nausea vomiting since about 4:00 today. In by EMS. Pain sharp and severe located left lower abdomen. Patient states she's had this for about a year now gets same pain all the time.  does not carry a Diagnosis. She is scheduled to see the "stomach doctor" tomorrow.  No fevers or chills. No diarrhea. No known sick contacts. Has had multiple emergency department visits for the same symptoms and multiple workups. No bloody or bilious emesis. Past Medical History  Diagnosis Date  . Thrombocytosis     Hem/Onc suggested 2/2 chronic hepatits and/or iron deficiency anemia  . Iron deficiency anemia   . N&V (nausea and vomiting)     Chronic. Unclear etiology with multiple admission and ED visits. CT abdomen with and without contrast (02/2011)  showed no acute process. Gastic Emptying scan (01/2010) was normal. Ultrasound of the abdomen was within normal limits. Hepatitis B viral load was undectable. HIV NR. EGD - gastritis, Hpylori + s/p Rx  . Abdominal pain   . Hypertension   . Hyperlipidemia   . Diabetes mellitus type 2, uncontrolled, with complications   . Recurrent boils   . Back pain   . OSA (obstructive sleep apnea)   . 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 Cath 12/10- no restenosis or significant CAD progression  . Irregular menses     Small ovarian follicles seen on CT(9/06)  . History of pyelonephritis     H/o GrpB Pyelonephritis (9/06) and UTI- 07/11- E.Coli, 12/10- GBS  . Abscess of tunica vaginalis     10/09- Abundant S.  aureus- sensitive to all abx  . Obesity   . Asthma   . Gastritis   . Depression   . Hepatitis B, chronic     Hep BeAb+,Hep B cAb+ & Hep BsAg+ (9/06)  . Peripheral neuropathy   . Fibromyalgia   . GERD (gastroesophageal reflux disease)   . Migraine   . Anxiety   . Gastroparesis     secondary to poorly controlled DM, last emptying study performed 01/2010  was normal but may be falsely positive as pt was on reglan  . Blood dyscrasia   . H/O: CVA (cerebrovascular accident)     history of remote right cerebellar infarct noted on head CT at least since 10/2011    Past Surgical History  Procedure Laterality Date  . Cesarean section  1997    Family History  Problem Relation Age of Onset  . Diabetes Father     History  Substance Use Topics  . Smoking status: Former Smoker    Types: Cigarettes    Quit date: 04/24/1996  . Smokeless tobacco: Never Used     Comment: quit smoking cigarettes age 45  . Alcohol Use: Yes     Comment: 03/31/11 "used marijuana last ~ 2 wk ago; only drink on occasions"    OB History   Grav Para Term Preterm Abortions TAB SAB Ect Mult Living                  Review of Systems  Constitutional: Negative for fever and chills.  HENT: Negative for neck pain and neck stiffness.   Eyes: Negative for pain.  Respiratory: Negative for shortness of breath.   Cardiovascular: Negative for chest pain.  Gastrointestinal: Positive for vomiting and abdominal pain.  Genitourinary: Negative for dysuria.  Musculoskeletal: Negative for back pain.  Skin: Negative for rash.  Neurological: Negative for headaches.  All other systems reviewed and are negative.    Allergies  Lisinopril  Home Medications   Current Outpatient Rx  Name  Route  Sig  Dispense  Refill  . acetaminophen (TYLENOL) 500 MG tablet   Oral   Take 1,000 mg by mouth every 6 (six) hours as needed for pain.         Marland Kitchen albuterol (PROVENTIL HFA;VENTOLIN HFA) 108 (90 BASE) MCG/ACT inhaler    Inhalation   Inhale 2 puffs into the lungs every 4 (four) hours as needed. For shortness of breath   1 Inhaler   6   . Fluticasone-Salmeterol (ADVAIR) 100-50 MCG/DOSE AEPB   Inhalation   Inhale 1 puff into the lungs every 12 (twelve) hours.   60 each   6   . HYDROcodone-acetaminophen (NORCO/VICODIN) 5-325 MG per tablet   Oral   Take 2 tablets by mouth every 4 (four) hours as needed for pain.   10 tablet   0   . insulin glargine (LANTUS) 100 UNIT/ML injection   Subcutaneous   Inject 0.4 mLs (40 Units total) into the skin at bedtime.   10 mL   3     Lot #  [id number] Exp. Date 8/205  Patient has been  ...   . labetalol (NORMODYNE) 100 MG tablet   Oral   Take 0.5 tablets (50 mg total) by mouth 2 (two) times daily.   60 tablet   0   . nitrofurantoin, macrocrystal-monohydrate, (MACROBID) 100 MG capsule   Oral   Take 1 capsule (100 mg total) by mouth 2 (two) times daily. X 7 days   14 capsule   0   . omeprazole (PRILOSEC) 40 MG capsule   Oral   Take 1 capsule (40 mg total) by mouth daily.   31 capsule   6   . promethazine (PHENERGAN) 12.5 MG tablet   Oral   Take 2 tablets (25 mg total) by mouth every 6 (six) hours as needed for nausea.   30 tablet   0   . promethazine (PHENERGAN) 25 MG suppository   Rectal   Place 1 suppository (25 mg total) rectally every 6 (six) hours as needed for nausea.   12 each   0   . traMADol (ULTRAM) 50 MG tablet   Oral   Take 1-2 tablets (50-100 mg total) by mouth every 6 (six) hours as needed for pain.   60 tablet   0     BP 202/105  Pulse 116  Temp(Src) 98.3 F (36.8 C) (Oral)  Resp 18  SpO2 96%  LMP 06/25/2012  Physical Exam  Constitutional: She is oriented to person, place, and time. She appears well-developed and well-nourished.  HENT:  Head: Normocephalic and atraumatic.  Mouth/Throat: Oropharynx is clear and moist. No oropharyngeal exudate.  Eyes: EOM are normal. Pupils are equal, round, and reactive to light. No  scleral icterus.  Neck: Neck supple.  Cardiovascular: Regular rhythm and intact distal pulses.   Pulmonary/Chest: Effort normal. No respiratory distress.  Abdominal: Soft. Bowel sounds are normal. She exhibits no distension. There is no rebound and no guarding.  Tender left lower quadrant - no acute abdomen  Musculoskeletal: Normal range of motion. She exhibits no edema.  Neurological: She is alert and oriented to person, place, and time.  Skin: Skin is warm and dry.    ED Course  Procedures (including critical care time)  Results for orders placed in visit on 07/15/12  MICROALBUMIN / CREATININE URINE RATIO      Result Value Range   Microalb, Ur 0.50  0.00 - 1.89 mg/dL   Creatinine, Urine 86.5     Microalb Creat Ratio 27.5  0.0 - 30.0 mg/g  LIPID PANEL      Result Value Range   Cholesterol 184  0 - 200 mg/dL   Triglycerides 784 (*) <150 mg/dL   HDL 38 (*) >69 mg/dL   Total CHOL/HDL Ratio 4.8     VLDL 56 (*) 0 - 40 mg/dL   LDL Cholesterol 90  0 - 99 mg/dL  GLUCOSE, CAPILLARY      Result Value Range   Glucose-Capillary >600 (*) 70 - 99 mg/dL   Comment 1 Documented in Chart     Comment 2 Notify RN     Comment 3 Call MD NNP PA CNM    BASIC METABOLIC PANEL      Result Value Range   Sodium 129 (*) 135 - 145 mEq/L   Potassium 4.5  3.5 - 5.3 mEq/L   Chloride 95 (*) 96 - 112 mEq/L   CO2 22  19 - 32 mEq/L   Glucose, Bld 702 (*) 70 - 99 mg/dL   BUN 11  6 - 23 mg/dL   Creat 6.29  5.28 - 4.13 mg/dL   Calcium 9.3  8.4 - 24.4 mg/dL  GLUCOSE, CAPILLARY      Result Value Range   Glucose-Capillary >600 (*) 70 - 99 mg/dL  POCT GLYCOSYLATED HEMOGLOBIN (HGB A1C)      Result Value Range   Hemoglobin A1C >14.0     Ct Head W Wo Contrast  06/20/2012  *RADIOLOGY REPORT*  Clinical Data: Chronic nausea with vomiting.  Evaluate for intracranial lesion. History of multiple substance abuse.  CT HEAD WITHOUT AND WITH CONTRAST  Technique:  Contiguous axial images were obtained from the base of the  skull through the vertex without and with intravenous contrast.  Contrast: 75mL OMNIPAQUE IOHEXOL 300 MG/ML  SOLN  Comparison: 11/15/2011.  Findings: There is no evidence for acute infarction, intracranial hemorrhage, mass lesion, hydrocephalus, or extra-axial fluid.  No atrophy or white matter disease.  Post contrast, no abnormal intracranial enhancement.  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   Calvarium intact.  Clear sinuses and mastoids.  IMPRESSION: Remote right cerebellar infarct.  Otherwise negative scan.   Original Report Authenticated By: Davonna Belling, M.D.    Dg Abd Acute W/chest  07/02/2012  *RADIOLOGY REPORT*  Clinical Data: Vomiting, left lower abdominal pain  ACUTE ABDOMEN SERIES (ABDOMEN 2 VIEW & CHEST 1 VIEW)  Comparison: 06/19/2012  Findings: Cardiomediastinal silhouette is within normal limits. The lungs are clear. No pleural effusion.  No pneumothorax.  No acute osseous abnormality.  No free air beneath the diaphragms.  A single loop of small bowel over the left mid abdomen measures mildly dilated at 3.1 cm.  Otherwise, no differential air-fluid level or bowel dilatation is identified.  No abnormal calcific opacity.  No acute osseous finding.  IMPRESSION: Nonspecific single mildly dilated left mid abdominal small bowel loop.  This could indicate focal ileus or early/partial small bowel obstruction.   Original  Report Authenticated By: Christiana Pellant, M.D.    Dg Abd Acute W/chest  06/19/2012  *RADIOLOGY REPORT*  Clinical Data: Emesis, abdominal pain  ACUTE ABDOMEN SERIES (ABDOMEN 2 VIEW & CHEST 1 VIEW)  Comparison: Chest and acute abdomen of 02/13/2012  Findings: The lungs are clear.  Mediastinal contours are stable. The heart is within normal limits in size.  No skeletal abnormality is seen.  Supine and erect views of the abdomen show no bowel obstruction. No free air is noted.  No opaque calculi are seen.  IMPRESSION:  1.  No active lung disease. 2.  No bowel obstruction.  No free  air.   Original Report Authenticated By: Dwyane Dee, M.D.     IV fluids. IV Dilaudid. IMPhenergan.  MDM  Nausea vomiting and chronic abdominal pain and diabetic - has multiple emergency department visits for the same. Carries diagnosis of fibromyalgia, anxiety, gastroparesis, gastritis, obesity  Old records and recent workups reviewed as above  Serial exams and no indication for repeat emergent imaging at this time  Is is followed closely by primary care and has scheduled GI followup  Symptoms treated, holding narcotics. Plan keep scheduled close outpatient followup        Sunnie Nielsen, MD 07/16/12 [phone]

## 2012-07-16 NOTE — ED Notes (Addendum)
Pt. Made aware for the need of urine. 

## 2012-07-16 NOTE — ED Notes (Signed)
Pt states that she has had emesis, LLQ pain for approx. 8 hours. Hx of such for approx. 1 year now. Was dx with a UTI today at PCP. Started having sx after. AxOx4.

## 2012-08-01 ENCOUNTER — Encounter (HOSPITAL_COMMUNITY): Payer: Self-pay | Admitting: *Deleted

## 2012-08-01 ENCOUNTER — Emergency Department (HOSPITAL_COMMUNITY)
Admission: EM | Admit: 2012-08-01 | Discharge: 2012-08-01 | Disposition: A | Discharge status: Home / Self Care | Payer: Medicaid Other | Attending: Emergency Medicine | Admitting: Emergency Medicine

## 2012-08-01 DIAGNOSIS — G8929 Other chronic pain: Secondary | ICD-10-CM | POA: Insufficient documentation

## 2012-08-01 DIAGNOSIS — Z8679 Personal history of other diseases of the circulatory system: Secondary | ICD-10-CM | POA: Insufficient documentation

## 2012-08-01 DIAGNOSIS — R112 Nausea with vomiting, unspecified: Secondary | ICD-10-CM | POA: Insufficient documentation

## 2012-08-01 DIAGNOSIS — IMO0002 Reserved for concepts with insufficient information to code with codable children: Secondary | ICD-10-CM | POA: Insufficient documentation

## 2012-08-01 DIAGNOSIS — Z8673 Personal history of transient ischemic attack (TIA), and cerebral infarction without residual deficits: Secondary | ICD-10-CM | POA: Insufficient documentation

## 2012-08-01 DIAGNOSIS — Z8719 Personal history of other diseases of the digestive system: Secondary | ICD-10-CM | POA: Insufficient documentation

## 2012-08-01 DIAGNOSIS — E1165 Type 2 diabetes mellitus with hyperglycemia: Secondary | ICD-10-CM | POA: Insufficient documentation

## 2012-08-01 DIAGNOSIS — Z79899 Other long term (current) drug therapy: Secondary | ICD-10-CM | POA: Insufficient documentation

## 2012-08-01 DIAGNOSIS — Z862 Personal history of diseases of the blood and blood-forming organs and certain disorders involving the immune mechanism: Secondary | ICD-10-CM | POA: Insufficient documentation

## 2012-08-01 DIAGNOSIS — Z8639 Personal history of other endocrine, nutritional and metabolic disease: Secondary | ICD-10-CM | POA: Insufficient documentation

## 2012-08-01 DIAGNOSIS — I252 Old myocardial infarction: Secondary | ICD-10-CM | POA: Insufficient documentation

## 2012-08-01 DIAGNOSIS — Z87898 Personal history of other specified conditions: Secondary | ICD-10-CM | POA: Insufficient documentation

## 2012-08-01 DIAGNOSIS — Z87891 Personal history of nicotine dependence: Secondary | ICD-10-CM | POA: Insufficient documentation

## 2012-08-01 DIAGNOSIS — Z8659 Personal history of other mental and behavioral disorders: Secondary | ICD-10-CM | POA: Insufficient documentation

## 2012-08-01 DIAGNOSIS — Z8742 Personal history of other diseases of the female genital tract: Secondary | ICD-10-CM | POA: Insufficient documentation

## 2012-08-01 DIAGNOSIS — Z794 Long term (current) use of insulin: Secondary | ICD-10-CM | POA: Insufficient documentation

## 2012-08-01 DIAGNOSIS — R1032 Left lower quadrant pain: Secondary | ICD-10-CM | POA: Insufficient documentation

## 2012-08-01 DIAGNOSIS — R000 Tachycardia, unspecified: Secondary | ICD-10-CM | POA: Insufficient documentation

## 2012-08-01 DIAGNOSIS — Z8669 Personal history of other diseases of the nervous system and sense organs: Secondary | ICD-10-CM | POA: Insufficient documentation

## 2012-08-01 DIAGNOSIS — Z872 Personal history of diseases of the skin and subcutaneous tissue: Secondary | ICD-10-CM | POA: Insufficient documentation

## 2012-08-01 DIAGNOSIS — I10 Essential (primary) hypertension: Secondary | ICD-10-CM | POA: Insufficient documentation

## 2012-08-01 DIAGNOSIS — R6883 Chills (without fever): Secondary | ICD-10-CM | POA: Insufficient documentation

## 2012-08-01 DIAGNOSIS — I2510 Atherosclerotic heart disease of native coronary artery without angina pectoris: Secondary | ICD-10-CM | POA: Insufficient documentation

## 2012-08-01 DIAGNOSIS — Z8739 Personal history of other diseases of the musculoskeletal system and connective tissue: Secondary | ICD-10-CM | POA: Insufficient documentation

## 2012-08-01 DIAGNOSIS — K219 Gastro-esophageal reflux disease without esophagitis: Secondary | ICD-10-CM | POA: Insufficient documentation

## 2012-08-01 DIAGNOSIS — Z8619 Personal history of other infectious and parasitic diseases: Secondary | ICD-10-CM | POA: Insufficient documentation

## 2012-08-01 DIAGNOSIS — E669 Obesity, unspecified: Secondary | ICD-10-CM | POA: Insufficient documentation

## 2012-08-01 DIAGNOSIS — Z87448 Personal history of other diseases of urinary system: Secondary | ICD-10-CM | POA: Insufficient documentation

## 2012-08-01 DIAGNOSIS — J45909 Unspecified asthma, uncomplicated: Secondary | ICD-10-CM | POA: Insufficient documentation

## 2012-08-01 LAB — CBC WITH DIFFERENTIAL/PLATELET
Basophils Relative: 0 % (ref 0–1)
Eosinophils Absolute: 0 10*3/uL (ref 0.0–0.7)
Hemoglobin: 11.8 g/dL — ABNORMAL LOW (ref 12.0–15.0)
MCH: 21.9 pg — ABNORMAL LOW (ref 26.0–34.0)
MCHC: 33.5 g/dL (ref 30.0–36.0)
Monocytes Absolute: 0.6 10*3/uL (ref 0.1–1.0)
Neutrophils Relative %: 88 % — ABNORMAL HIGH (ref 43–77)
RDW: 17.7 % — ABNORMAL HIGH (ref 11.5–15.5)

## 2012-08-01 LAB — GLUCOSE, CAPILLARY
Glucose-Capillary: 270 mg/dL — ABNORMAL HIGH (ref 70–99)
Glucose-Capillary: 241 mg/dL — ABNORMAL HIGH (ref 70–99)

## 2012-08-01 LAB — COMPREHENSIVE METABOLIC PANEL
AST: 14 U/L (ref 0–37)
CO2: 22 mEq/L (ref 19–32)
Calcium: 10.1 mg/dL (ref 8.4–10.5)
Creatinine, Ser: 0.67 mg/dL (ref 0.50–1.10)
GFR calc Af Amer: >90 mL/min (ref >90)
GFR calc non Af Amer: >90 mL/min (ref >90)
Glucose, Bld: 281 mg/dL — ABNORMAL HIGH (ref 70–99)

## 2012-08-01 MED ORDER — METOCLOPRAMIDE HCL 5 MG/ML IJ SOLN
10.0000 mg | Freq: Once | INTRAMUSCULAR | Status: AC
  Administered 2012-08-01: 10 mg via INTRAVENOUS
  Filled 2012-08-01: qty 2

## 2012-08-01 MED ORDER — MORPHINE SULFATE 4 MG/ML IJ SOLN
4.0000 mg | Freq: Once | INTRAMUSCULAR | Status: AC
  Administered 2012-08-01: 4 mg via INTRAVENOUS
  Filled 2012-08-01: qty 1

## 2012-08-01 MED ORDER — PROMETHAZINE HCL 25 MG/ML IJ SOLN
25.0000 mg | Freq: Once | INTRAMUSCULAR | Status: AC
  Administered 2012-08-01: 25 mg via INTRAVENOUS
  Filled 2012-08-01: qty 1

## 2012-08-01 MED ORDER — ONDANSETRON HCL 4 MG/2ML IJ SOLN
4.0000 mg | Freq: Once | INTRAMUSCULAR | Status: AC
  Administered 2012-08-01: 4 mg via INTRAVENOUS
  Filled 2012-08-01: qty 2

## 2012-08-01 MED ORDER — PANTOPRAZOLE SODIUM 40 MG IV SOLR
40.0000 mg | Freq: Once | INTRAVENOUS | Status: AC
  Administered 2012-08-01: 40 mg via INTRAVENOUS
  Filled 2012-08-01: qty 40

## 2012-08-01 MED ORDER — SODIUM CHLORIDE 0.9 % IV BOLUS (SEPSIS)
1000.0000 mL | Freq: Once | INTRAVENOUS | Status: AC
  Administered 2012-08-01: 1000 mL via INTRAVENOUS

## 2012-08-01 MED ORDER — GI COCKTAIL ~~LOC~~
30.0000 mL | Freq: Once | ORAL | Status: AC
  Administered 2012-08-01: 30 mL via ORAL
  Filled 2012-08-01: qty 30

## 2012-08-01 NOTE — ED Notes (Signed)
CBG @ 241

## 2012-08-01 NOTE — ED Notes (Signed)
Pt. CBG 270. Notified RN, Christeen Douglas.

## 2012-08-01 NOTE — ED Notes (Signed)
Pt. Heart rate 130 and blood pressure 181/102. Notified RN, Christeen Douglas.

## 2012-08-01 NOTE — ED Provider Notes (Signed)
Received pt from Ivonne Andrew, PA-C pending finishing of fluids, management of pain, PO challenge re-assessment of tachycardia and blood glucose levels. After 3 liters of fluid, pt HR decreased from 130s to 110, was normotensive, able to tolerate PO, and ambulate while maintaining her baseline heart rate. Pt instructed to follow up with her doctor to manage her chronic pain as she has been evaluated with these same symptoms for years. Pt case discussed with and seen by Dr. Effie Shy who agrees with discharge plan.   Glade Nurse, PA-C 08/01/12 1720

## 2012-08-01 NOTE — ED Notes (Signed)
Pt able to ambulate to restroom with no difficulty.

## 2012-08-01 NOTE — ED Notes (Signed)
Pt still reports nausea but writer has not witnessed any active vomiting since pt has been here.

## 2012-08-01 NOTE — ED Notes (Signed)
Per PT[facility] report: Pt c/o of nausea and vomiting x 1 day. Pt hx of DM and HTN. Pt reports she has not taken any of her medication in a week. Pt c/o of lower abd pain.  PTAR vitals: BP: 220/180, HR: 122, 98%RA, RR: 20 cbg: 280

## 2012-08-01 NOTE — ED Provider Notes (Signed)
History     CSN: [id number]  Arrival date & time 08/01/12  0126   First MD Initiated Contact with Patient 08/01/12 0236      Chief Complaint  Patient presents with  . Abdominal Pain   HPI  History provided by the patient and recent medical chart. Patient is a 37 year old female with history of hypertension, hyperlipidemia, diabetes, gastroparesis, chronic abdominal pains with nausea vomiting symptoms who presents with increased abdominal pain and nausea vomiting similar to her chronic symptoms. Patient has multiple frequent emergency room visits with symptoms of nausea vomiting and abdominal pains. Her symptoms today are similar. They began to become worse around 2 PM yesterday afternoon. They were not controlled with any of her home medications. Patient has had numerous evaluations and workup for her symptoms without any definite cause aside from her gastroparesis. Patient is also followed by GI specialist. She denies any other recent symptoms. No recent fever, chills or sweats. No chest pain or shortness of breath. No diarrhea or constipation. No urinary complaints. No menstrual change. No vaginal bleeding or vaginal discharge.    Past Medical History  Diagnosis Date  . Thrombocytosis     Hem/Onc suggested 2/2 chronic hepatits and/or iron deficiency anemia  . Iron deficiency anemia   . N&V (nausea and vomiting)     Chronic. Unclear etiology with multiple admission and ED visits. CT abdomen with and without contrast (02/2011)  showed no acute process. Gastic Emptying scan (01/2010) was normal. Ultrasound of the abdomen was within normal limits. Hepatitis B viral load was undectable. HIV NR. EGD - gastritis, Hpylori + s/p Rx  . Abdominal pain   . Hypertension   . Hyperlipidemia   . Diabetes mellitus type 2, uncontrolled, with complications   . Recurrent boils   . Back pain   . OSA (obstructive sleep apnea)   . CAD (coronary artery disease) 06/15/2006    s/p Subendocardial MI with PDA  angioplasty(no stent) on 06/15/06 and relook  cath 06/19/06 showed patency of site. Cath 12/10- no restenosis or significant CAD progression  . Irregular menses     Small ovarian follicles seen on CT(9/06)  . History of pyelonephritis     H/o GrpB Pyelonephritis (9/06) and UTI- 07/11- E.Coli, 12/10- GBS  . Abscess of tunica vaginalis     10/09- Abundant S. aureus- sensitive to all abx  . Obesity   . Asthma   . Gastritis   . Depression   . Hepatitis B, chronic     Hep BeAb+,Hep B cAb+ & Hep BsAg+ (9/06)  . Peripheral neuropathy   . Fibromyalgia   . GERD (gastroesophageal reflux disease)   . Migraine   . Anxiety   . Gastroparesis     secondary to poorly controlled DM, last emptying study performed 01/2010  was normal but may be falsely positive as pt was on reglan  . Blood dyscrasia   . H/O: CVA (cerebrovascular accident)     history of remote right cerebellar infarct noted on head CT at least since 10/2011    Past Surgical History  Procedure Laterality Date  . Cesarean section  1997    Family History  Problem Relation Age of Onset  . Diabetes Father     History  Substance Use Topics  . Smoking status: Former Smoker    Types: Cigarettes    Quit date: 04/24/1996  . Smokeless tobacco: Never Used     Comment: quit smoking cigarettes age 21  . Alcohol Use: Yes  Comment: 03/31/11 "used marijuana last ~ 2 wk ago; only drink on occasions"    OB History   Grav Para Term Preterm Abortions TAB SAB Ect Mult Living                  Review of Systems  Constitutional: Positive for chills. Negative for fever and diaphoresis.  Respiratory: Negative for shortness of breath.   Cardiovascular: Negative for chest pain.  Gastrointestinal: Positive for nausea, vomiting and abdominal pain. Negative for diarrhea and constipation.  Genitourinary: Negative for dysuria, frequency, hematuria, flank pain, vaginal bleeding and vaginal discharge.  All other systems reviewed and are  negative.    Allergies  Lisinopril  Home Medications   Current Outpatient Rx  Name  Route  Sig  Dispense  Refill  . acetaminophen (TYLENOL) 500 MG tablet   Oral   Take 1,000 mg by mouth every 6 (six) hours as needed for pain.         Marland Kitchen Fluticasone-Salmeterol (ADVAIR) 100-50 MCG/DOSE AEPB   Inhalation   Inhale 1 puff into the lungs every 12 (twelve) hours.   60 each   6   . HYDROcodone-acetaminophen (NORCO/VICODIN) 5-325 MG per tablet   Oral   Take 2 tablets by mouth every 4 (four) hours as needed for pain.   10 tablet   0   . insulin glargine (LANTUS) 100 UNIT/ML injection   Subcutaneous   Inject 0.4 mLs (40 Units total) into the skin at bedtime.   10 mL   3     Lot #  [id number] Exp. Date 8/205  Patient has been  ...   . labetalol (NORMODYNE) 100 MG tablet   Oral   Take 0.5 tablets (50 mg total) by mouth 2 (two) times daily.   60 tablet   0   . omeprazole (PRILOSEC) 40 MG capsule   Oral   Take 1 capsule (40 mg total) by mouth daily.   31 capsule   6   . promethazine (PHENERGAN) 12.5 MG tablet   Oral   Take 2 tablets (25 mg total) by mouth every 6 (six) hours as needed for nausea.   30 tablet   0   . traMADol (ULTRAM) 50 MG tablet   Oral   Take 1-2 tablets (50-100 mg total) by mouth every 6 (six) hours as needed for pain.   60 tablet   0   . albuterol (PROVENTIL HFA;VENTOLIN HFA) 108 (90 BASE) MCG/ACT inhaler   Inhalation   Inhale 2 puffs into the lungs every 4 (four) hours as needed. For shortness of breath   1 Inhaler   6   . promethazine (PHENERGAN) 25 MG suppository   Rectal   Place 1 suppository (25 mg total) rectally every 6 (six) hours as needed for nausea.   12 each   0     BP 181/102  Pulse 130  Resp 18  SpO2 96%  LMP 07/24/2012  Physical Exam  Nursing note and vitals reviewed. Constitutional: She is oriented to person, place, and time. She appears well-developed and well-nourished. No distress.  HENT:  Head: Normocephalic.   Cardiovascular: Regular rhythm.  Tachycardia present.   Pulmonary/Chest: Effort normal and breath sounds normal. No respiratory distress. She has no wheezes.  Abdominal: Soft. There is tenderness in the left lower quadrant. There is no rigidity, no rebound, no guarding, no CVA tenderness, no tenderness at McBurney's point and negative Murphy's sign.  Musculoskeletal: Normal range of motion.  Neurological: She is  alert and oriented to person, place, and time.  Skin: Skin is warm and dry. No rash noted.  Psychiatric: She has a normal mood and affect. Her behavior is normal.    ED Course  Procedures   Results for orders placed during the hospital encounter of 08/01/12  GLUCOSE, CAPILLARY      Result Value Range   Glucose-Capillary 270 (*) 70 - 99 mg/dL   Comment 1 Notify RN    CBC WITH DIFFERENTIAL      Result Value Range   WBC 18.7 (*) 4.0 - 10.5 K/uL   RBC 5.40 (*) 3.87 - 5.11 MIL/uL   Hemoglobin 11.8 (*) 12.0 - 15.0 g/dL   HCT 16.1 (*) 09.6 - 04.5 %   MCV 65.2 (*) 78.0 - 100.0 fL   MCH 21.9 (*) 26.0 - 34.0 pg   MCHC 33.5  30.0 - 36.0 g/dL   RDW 40.9 (*) 81.1 - 91.4 %   Platelets 505 (*) 150 - 400 K/uL   Neutrophils Relative 88 (*) 43 - 77 %   Lymphocytes Relative 9 (*) 12 - 46 %   Monocytes Relative 3  3 - 12 %   Eosinophils Relative 0  0 - 5 %   Basophils Relative 0  0 - 1 %   Neutro Abs 16.4 (*) 1.7 - 7.7 K/uL   Lymphs Abs 1.7  0.7 - 4.0 K/uL   Monocytes Absolute 0.6  0.1 - 1.0 K/uL   Eosinophils Absolute 0.0  0.0 - 0.7 K/uL   Basophils Absolute 0.0  0.0 - 0.1 K/uL   RBC Morphology TARGET CELLS     Smear Review LARGE PLATELETS PRESENT    COMPREHENSIVE METABOLIC PANEL      Result Value Range   Sodium 133 (*) 135 - 145 mEq/L   Potassium 3.7  3.5 - 5.1 mEq/L   Chloride 95 (*) 96 - 112 mEq/L   CO2 22  19 - 32 mEq/L   Glucose, Bld 281 (*) 70 - 99 mg/dL   BUN 11  6 - 23 mg/dL   Creatinine, Ser 7.82  0.50 - 1.10 mg/dL   Calcium 95.6  8.4 - 21.3 mg/dL   Total Protein 8.8  (*) 6.0 - 8.3 g/dL   Albumin 3.5  3.5 - 5.2 g/dL   AST 14  0 - 37 U/L   ALT 6  0 - 35 U/L   Alkaline Phosphatase 80  39 - 117 U/L   Total Bilirubin 0.4  0.3 - 1.2 mg/dL   GFR calc non Af Amer >90  >90 mL/min   GFR calc Af Amer >90  >90 mL/min      No results found.   No diagnosis found.    MDM  2:40AM patient seen and evaluated. Patient shaking in the bed of extremities. Moderate discomfort. She does not appear in any acute distress.  Patient with multiple frequent emergency room visits and history of diabetes, gastroparesis chronic abdominal pains with nausea and vomiting episodes. She has undergone numerous imaging studies that consistently do not show any acute or emergent findings. Today are similar with no significant changes.  Patient discussed in sign out with Vassie Moselle PA-C.  She will continue to monitor patient's symptoms and evaluated heart rate improvements after fluids. Anticipate discharge if symptomatic improvement.    Angus Seller, PA-C 08/01/12 [phone]

## 2012-08-01 NOTE — ED Provider Notes (Signed)
Medical screening examination/treatment/procedure(s) were conducted as a shared visit with non-physician practitioner(s) and myself.  I personally evaluated the patient during the encounter  Flint Melter, MD 08/01/12 2200

## 2012-08-01 NOTE — ED Provider Notes (Signed)
Shirley [name] is a 37 y.o. female here with recurrent symptoms of abdominal pain, nausea, vomiting, and food intolerance. She was recently seen by her primary care provider and given a prescription for Toradol. The provider considered referral to GI for gastric emptying study. She was out of her insulin 4 days ago.  Exam alert, cooperative. Heart rate 110 palpated in the left wrist. Nonfocal neurologic exam. Respiratory normal effort.  Assessment: Chronic recurrent abdominal pain with history of substance abuse. 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 She is noncompliant with medical therapy. She has numerous visits to the ED both recently and remotely. There is no indication for further treatment beyond the 3 L saline treatment today. She is stable for discharge. He is encouraged to follow up with her PCP for further evaluation and treatment of her chronic and recurrent symptoms.   Medical screening examination/treatment/procedure(s) were conducted as a shared visit with non-physician practitioner(s) and myself.  I personally evaluated the patient during the encounter  Flint Melter, MD 08/01/12 2200

## 2012-08-01 NOTE — ED Notes (Signed)
225 CBG  

## 2012-08-01 NOTE — ED Notes (Signed)
RN removed PIV and put discharge papers at bedside; RN let pt know that she would be back to discharge.  Upon returning at [phone] pt had left with her partner and took discharge papers with her.  Notified charge Charity fundraiser.

## 2012-08-01 NOTE — ED Notes (Signed)
Pt was able to independently ambulate in the hallway approx 400 ft

## 2012-08-01 NOTE — ED Provider Notes (Signed)
Medical screening examination/treatment/procedure(s) were performed by non-physician practitioner and as supervising physician I was immediately available for consultation/collaboration.  Jones Skene, M.D.   Jones Skene, MD 08/01/12 [phone]

## 2012-08-15 ENCOUNTER — Emergency Department (HOSPITAL_COMMUNITY): Payer: Medicaid Other

## 2012-08-15 ENCOUNTER — Emergency Department (HOSPITAL_COMMUNITY)
Admission: EM | Admit: 2012-08-15 | Discharge: 2012-08-16 | Disposition: A | Discharge status: Home / Self Care | Payer: Medicaid Other | Attending: Emergency Medicine | Admitting: Emergency Medicine

## 2012-08-15 ENCOUNTER — Encounter (HOSPITAL_COMMUNITY): Payer: Self-pay

## 2012-08-15 DIAGNOSIS — Z87891 Personal history of nicotine dependence: Secondary | ICD-10-CM | POA: Insufficient documentation

## 2012-08-15 DIAGNOSIS — F329 Major depressive disorder, single episode, unspecified: Secondary | ICD-10-CM | POA: Insufficient documentation

## 2012-08-15 DIAGNOSIS — R350 Frequency of micturition: Secondary | ICD-10-CM | POA: Insufficient documentation

## 2012-08-15 DIAGNOSIS — B181 Chronic viral hepatitis B without delta-agent: Secondary | ICD-10-CM

## 2012-08-15 DIAGNOSIS — I10 Essential (primary) hypertension: Secondary | ICD-10-CM | POA: Insufficient documentation

## 2012-08-15 DIAGNOSIS — E1165 Type 2 diabetes mellitus with hyperglycemia: Secondary | ICD-10-CM

## 2012-08-15 DIAGNOSIS — Z3202 Encounter for pregnancy test, result negative: Secondary | ICD-10-CM | POA: Insufficient documentation

## 2012-08-15 DIAGNOSIS — Z8673 Personal history of transient ischemic attack (TIA), and cerebral infarction without residual deficits: Secondary | ICD-10-CM | POA: Insufficient documentation

## 2012-08-15 DIAGNOSIS — Z8742 Personal history of other diseases of the female genital tract: Secondary | ICD-10-CM | POA: Insufficient documentation

## 2012-08-15 DIAGNOSIS — J45909 Unspecified asthma, uncomplicated: Secondary | ICD-10-CM | POA: Insufficient documentation

## 2012-08-15 DIAGNOSIS — I2510 Atherosclerotic heart disease of native coronary artery without angina pectoris: Secondary | ICD-10-CM | POA: Insufficient documentation

## 2012-08-15 DIAGNOSIS — Z79899 Other long term (current) drug therapy: Secondary | ICD-10-CM | POA: Insufficient documentation

## 2012-08-15 DIAGNOSIS — IMO0001 Reserved for inherently not codable concepts without codable children: Secondary | ICD-10-CM | POA: Insufficient documentation

## 2012-08-15 DIAGNOSIS — K219 Gastro-esophageal reflux disease without esophagitis: Secondary | ICD-10-CM | POA: Insufficient documentation

## 2012-08-15 DIAGNOSIS — D72829 Elevated white blood cell count, unspecified: Secondary | ICD-10-CM | POA: Insufficient documentation

## 2012-08-15 DIAGNOSIS — K3184 Gastroparesis: Secondary | ICD-10-CM | POA: Insufficient documentation

## 2012-08-15 DIAGNOSIS — Z8739 Personal history of other diseases of the musculoskeletal system and connective tissue: Secondary | ICD-10-CM | POA: Insufficient documentation

## 2012-08-15 DIAGNOSIS — Z8639 Personal history of other endocrine, nutritional and metabolic disease: Secondary | ICD-10-CM | POA: Insufficient documentation

## 2012-08-15 DIAGNOSIS — Z8669 Personal history of other diseases of the nervous system and sense organs: Secondary | ICD-10-CM | POA: Insufficient documentation

## 2012-08-15 DIAGNOSIS — R1032 Left lower quadrant pain: Secondary | ICD-10-CM

## 2012-08-15 DIAGNOSIS — R112 Nausea with vomiting, unspecified: Secondary | ICD-10-CM

## 2012-08-15 DIAGNOSIS — IMO0002 Reserved for concepts with insufficient information to code with codable children: Secondary | ICD-10-CM | POA: Insufficient documentation

## 2012-08-15 DIAGNOSIS — Z8719 Personal history of other diseases of the digestive system: Secondary | ICD-10-CM | POA: Insufficient documentation

## 2012-08-15 DIAGNOSIS — E669 Obesity, unspecified: Secondary | ICD-10-CM | POA: Insufficient documentation

## 2012-08-15 DIAGNOSIS — Z872 Personal history of diseases of the skin and subcutaneous tissue: Secondary | ICD-10-CM | POA: Insufficient documentation

## 2012-08-15 DIAGNOSIS — M549 Dorsalgia, unspecified: Secondary | ICD-10-CM | POA: Insufficient documentation

## 2012-08-15 DIAGNOSIS — F3289 Other specified depressive episodes: Secondary | ICD-10-CM | POA: Insufficient documentation

## 2012-08-15 DIAGNOSIS — F411 Generalized anxiety disorder: Secondary | ICD-10-CM | POA: Insufficient documentation

## 2012-08-15 DIAGNOSIS — Z862 Personal history of diseases of the blood and blood-forming organs and certain disorders involving the immune mechanism: Secondary | ICD-10-CM | POA: Insufficient documentation

## 2012-08-15 DIAGNOSIS — R3915 Urgency of urination: Secondary | ICD-10-CM | POA: Insufficient documentation

## 2012-08-15 DIAGNOSIS — E1149 Type 2 diabetes mellitus with other diabetic neurological complication: Secondary | ICD-10-CM | POA: Insufficient documentation

## 2012-08-15 DIAGNOSIS — R000 Tachycardia, unspecified: Secondary | ICD-10-CM | POA: Insufficient documentation

## 2012-08-15 DIAGNOSIS — Z794 Long term (current) use of insulin: Secondary | ICD-10-CM | POA: Insufficient documentation

## 2012-08-15 DIAGNOSIS — Z87448 Personal history of other diseases of urinary system: Secondary | ICD-10-CM | POA: Insufficient documentation

## 2012-08-15 DIAGNOSIS — G43909 Migraine, unspecified, not intractable, without status migrainosus: Secondary | ICD-10-CM | POA: Insufficient documentation

## 2012-08-15 LAB — CBC WITH DIFFERENTIAL/PLATELET
Basophils Absolute: 0 10*3/uL (ref 0.0–0.1)
Basophils Relative: 0 % (ref 0–1)
Eosinophils Absolute: 0.2 10*3/uL (ref 0.0–0.7)
Eosinophils Relative: 2 % (ref 0–5)
HCT: 34.9 % — ABNORMAL LOW (ref 36.0–46.0)
Hemoglobin: 11.8 g/dL — ABNORMAL LOW (ref 12.0–15.0)
Lymphocytes Relative: 23 % (ref 12–46)
Lymphs Abs: 2.9 10*3/uL (ref 0.7–4.0)
MCH: 21.8 pg — ABNORMAL LOW (ref 26.0–34.0)
MCHC: 33.8 g/dL (ref 30.0–36.0)
MCV: 64.4 fL — ABNORMAL LOW (ref 78.0–100.0)
Monocytes Absolute: 0.8 10*3/uL (ref 0.1–1.0)
Monocytes Relative: 6 % (ref 3–12)
Neutro Abs: 8.6 10*3/uL — ABNORMAL HIGH (ref 1.7–7.7)
Neutrophils Relative %: 69 % (ref 43–77)
Platelets: 493 10*3/uL — ABNORMAL HIGH (ref 150–400)
RBC: 5.42 MIL/uL — ABNORMAL HIGH (ref 3.87–5.11)
RDW: 17.7 % — ABNORMAL HIGH (ref 11.5–15.5)
WBC: 12.5 10*3/uL — ABNORMAL HIGH (ref 4.0–10.5)

## 2012-08-15 LAB — COMPREHENSIVE METABOLIC PANEL
Alkaline Phosphatase: 76 U/L (ref 39–117)
BUN: 9 mg/dL (ref 6–23)
GFR calc Af Amer: >90 mL/min (ref >90)
GFR calc non Af Amer: >90 mL/min (ref >90)
Glucose, Bld: 295 mg/dL — ABNORMAL HIGH (ref 70–99)
Potassium: 3.6 mEq/L (ref 3.5–5.1)
Total Bilirubin: 0.3 mg/dL (ref 0.3–1.2)
Total Protein: 8.2 g/dL (ref 6.0–8.3)

## 2012-08-15 LAB — URINE MICROSCOPIC-ADD ON

## 2012-08-15 LAB — URINALYSIS, ROUTINE W REFLEX MICROSCOPIC
Bilirubin Urine: NEGATIVE
Ketones, ur: NEGATIVE mg/dL
Nitrite: NEGATIVE
Protein, ur: NEGATIVE mg/dL
Specific Gravity, Urine: 1.024 (ref 1.005–1.030)
Urobilinogen, UA: 1 mg/dL (ref 0.0–1.0)

## 2012-08-15 LAB — LIPASE, BLOOD: Lipase: 45 U/L (ref 11–59)

## 2012-08-15 LAB — POCT PREGNANCY, URINE: Preg Test, Ur: NEGATIVE

## 2012-08-15 MED ORDER — SODIUM CHLORIDE 0.9 % IV SOLN: 1000.0000 mL | INTRAVENOUS | Status: DC

## 2012-08-15 MED ORDER — SODIUM CHLORIDE 0.9 % IV BOLUS (SEPSIS)
1000.0000 mL | Freq: Once | INTRAVENOUS | Status: AC
  Administered 2012-08-15: 1000 mL via INTRAVENOUS

## 2012-08-15 MED ORDER — HYDROMORPHONE HCL PF 1 MG/ML IJ SOLN
1.0000 mg | Freq: Once | INTRAMUSCULAR | Status: AC
  Administered 2012-08-15: 1 mg via INTRAVENOUS
  Filled 2012-08-15: qty 1

## 2012-08-15 MED ORDER — ONDANSETRON HCL 4 MG/2ML IJ SOLN
4.0000 mg | Freq: Once | INTRAMUSCULAR | Status: AC
  Administered 2012-08-15: 4 mg via INTRAVENOUS
  Filled 2012-08-15: qty 2

## 2012-08-15 MED ORDER — PROMETHAZINE HCL 25 MG/ML IJ SOLN
25.0000 mg | Freq: Once | INTRAMUSCULAR | Status: AC
  Administered 2012-08-15: 25 mg via INTRAVENOUS
  Filled 2012-08-15: qty 1

## 2012-08-15 MED ORDER — METOCLOPRAMIDE HCL 5 MG/ML IJ SOLN
10.0000 mg | Freq: Once | INTRAMUSCULAR | Status: AC
  Administered 2012-08-15: 10 mg via INTRAVENOUS
  Filled 2012-08-15: qty 2

## 2012-08-15 MED ORDER — SODIUM CHLORIDE 0.9 % IV SOLN
1000.0000 mL | Freq: Once | INTRAVENOUS | Status: AC
  Administered 2012-08-15: 1000 mL via INTRAVENOUS

## 2012-08-15 NOTE — ED Notes (Addendum)
Per EMS, pt from home. Pt c/o n/v for about 5 hours.  Nothing coming up d/t pt states she has not eaten since yesterday.  Chronic hx of same.  Pt has hx of gastroparesis.  Vitals: 150/100, ST 108, 24 resp, cbg 205.  No insulin in 3/4 days.  Pt states she is out.

## 2012-08-15 NOTE — ED Provider Notes (Signed)
History     CSN: [id number]  Arrival date & time 08/15/12  1919   First MD Initiated Contact with Patient 08/15/12 1927      Chief Complaint  Patient presents with  . Nausea  . Emesis    (Consider location/radiation/quality/duration/timing/severity/associated sxs/prior treatment) Patient is a 37 y.o. female presenting with vomiting. The history is provided by the patient and medical records. No language interpreter was used.  Emesis Severity:  Moderate Duration:  2 days Timing:  Constant Quality:  Stomach contents (nbnb) Progression:  Unchanged Chronicity:  Recurrent Recent urination:  Normal Context: not post-tussive and not self-induced   Relieved by:  Nothing Worsened by:  Food smell and liquids Ineffective treatments:  Antiemetics Associated symptoms: abdominal pain and myalgias   Associated symptoms: no arthralgias, no chills, no cough, no diarrhea, no fever, no headaches, no sore throat and no URI   Risk factors: diabetes   Risk factors: no alcohol use, not pregnant now, no prior abdominal surgery, no sick contacts, no suspect food intake and no travel to endemic areas     Shirley [name] is a 37 y.o. female  with a hx of recurrent and chronic abdominal pain, gastroparesis, diabetes, chronic back pain, hepatitis B, GERD presents to the Emergency Department complaining of gradual, persistent, progressively worsening abdominal pain, nausea, vomiting and food intolerance beginning yesterday. Patient also with left-sided flank pain which began several days ago.. Associated symptoms include left upper quadrant abdominal pain, left-sided flank pain, nausea, vomiting.  Patient has tried Phenergan at home without relief and there is nothing she can take to make it better. Didn't eat makes things worse. Patient denies fever, chills, headache, neck pain, chest pain, shortness of breath, diarrhea, weakness, dizziness, syncope, dysuria, hematuria, vaginal discharge vaginal bleeding.    Past Medical History  Diagnosis Date  . Thrombocytosis     Hem/Onc suggested 2/2 chronic hepatits and/or iron deficiency anemia  . Iron deficiency anemia   . N&V (nausea and vomiting)     Chronic. Unclear etiology with multiple admission and ED visits. CT abdomen with and without contrast (02/2011)  showed no acute process. Gastic Emptying scan (01/2010) was normal. Ultrasound of the abdomen was within normal limits. Hepatitis B viral load was undectable. HIV NR. EGD - gastritis, Hpylori + s/p Rx  . Abdominal pain   . Hypertension   . Hyperlipidemia   . Diabetes mellitus type 2, uncontrolled, with complications   . Recurrent boils   . Back pain   . OSA (obstructive sleep apnea)   . 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 Cath 12/10- no restenosis or significant CAD progression  . Irregular menses     Small ovarian follicles seen on CT(9/06)  . History of pyelonephritis     H/o GrpB Pyelonephritis (9/06) and UTI- 07/11- E.Coli, 12/10- GBS  . Abscess of tunica vaginalis     10/09- Abundant S. aureus- sensitive to all abx  . Obesity   . Asthma   . Gastritis   . Depression   . Hepatitis B, chronic     Hep BeAb+,Hep B cAb+ & Hep BsAg+ (9/06)  . Peripheral neuropathy   . Fibromyalgia   . GERD (gastroesophageal reflux disease)   . Migraine   . Anxiety   . Gastroparesis     secondary to poorly controlled DM, last emptying study performed 01/2010  was normal but  may be falsely positive as pt was on reglan  . Blood dyscrasia   . H/O: CVA (cerebrovascular accident)     history of remote right cerebellar infarct noted on head CT at least since 10/2011    Past Surgical History  Procedure Laterality Date  . Cesarean section  1997    Family History  Problem Relation Age of Onset  . Diabetes Father     History  Substance Use Topics  . Smoking status: Former  Smoker    Types: Cigarettes    Quit date: 04/24/1996  . Smokeless tobacco: Never Used     Comment: quit smoking cigarettes age 65  . Alcohol Use: Yes     Comment: 03/31/11 "used marijuana last ~ 2 wk ago; only drink on occasions"    OB History   Grav Para Term Preterm Abortions TAB SAB Ect Mult Living                  Review of Systems  Constitutional: Negative for fever, chills, diaphoresis, appetite change, fatigue and unexpected weight change.  HENT: Negative for sore throat, mouth sores, trouble swallowing, neck pain and neck stiffness.   Respiratory: Negative for cough, chest tightness, shortness of breath, wheezing and stridor.   Cardiovascular: Negative for chest pain and palpitations.  Gastrointestinal: Positive for nausea, vomiting and abdominal pain. Negative for diarrhea, constipation, blood in stool, abdominal distention and rectal pain.  Genitourinary: Positive for urgency, frequency and flank pain. Negative for dysuria, hematuria and difficulty urinating.  Musculoskeletal: Positive for myalgias. Negative for back pain and arthralgias.  Skin: Negative for rash.  Neurological: Negative for weakness and headaches.  Hematological: Negative for adenopathy.  Psychiatric/Behavioral: Negative for confusion.  All other systems reviewed and are negative.    Allergies  Lisinopril  Home Medications   Current Outpatient Rx  Name  Route  Sig  Dispense  Refill  . acetaminophen (TYLENOL) 500 MG tablet   Oral   Take 1,000 mg by mouth every 6 (six) hours as needed for pain.         Marland Kitchen albuterol (PROVENTIL HFA;VENTOLIN HFA) 108 (90 BASE) MCG/ACT inhaler   Inhalation   Inhale 2 puffs into the lungs every 4 (four) hours as needed. For shortness of breath   1 Inhaler   6   . Fluticasone-Salmeterol (ADVAIR) 100-50 MCG/DOSE AEPB   Inhalation   Inhale 1 puff into the lungs every 12 (twelve) hours.   60 each   6   . HYDROcodone-acetaminophen (NORCO/VICODIN) 5-325 MG per  tablet   Oral   Take 2 tablets by mouth every 4 (four) hours as needed for pain.   10 tablet   0   . insulin glargine (LANTUS) 100 UNIT/ML injection   Subcutaneous   Inject 0.4 mLs (40 Units total) into the skin at bedtime.   10 mL   3     Lot #  [id number] Exp. Date 8/205  Patient has been  ...   . labetalol (NORMODYNE) 100 MG tablet   Oral   Take 0.5 tablets (50 mg total) by mouth 2 (two) times daily.   60 tablet   0   . omeprazole (PRILOSEC) 40 MG capsule   Oral   Take 1 capsule (40 mg total) by mouth daily.   31 capsule   6   . pregabalin (LYRICA) 75 MG capsule   Oral   Take 75 mg by mouth daily.         . promethazine (  PHENERGAN) 12.5 MG tablet   Oral   Take 2 tablets (25 mg total) by mouth every 6 (six) hours as needed for nausea.   30 tablet   0   . promethazine (PHENERGAN) 25 MG suppository   Rectal   Place 1 suppository (25 mg total) rectally every 6 (six) hours as needed for nausea.   12 each   0   . traMADol (ULTRAM) 50 MG tablet   Oral   Take 1-2 tablets (50-100 mg total) by mouth every 6 (six) hours as needed for pain.   60 tablet   0     BP 139/87  Pulse 80  Temp(Src) 97.9 F (36.6 C) (Oral)  Resp 16  SpO2 94%  LMP 07/24/2012  Physical Exam  Nursing note and vitals reviewed. Constitutional: She is oriented to person, place, and time. She appears well-developed and well-nourished.  HENT:  Head: Normocephalic and atraumatic.  Mouth/Throat: Oropharynx is clear and moist.  Eyes: Conjunctivae are normal. Pupils are equal, round, and reactive to light. No scleral icterus.  Neck: Normal range of motion.  Cardiovascular: Regular rhythm, S1 normal, S2 normal, normal heart sounds and intact distal pulses.  Tachycardia present.   No murmur heard. Pulses:      Radial pulses are 2+ on the right side, and 2+ on the left side.       Dorsalis pedis pulses are 2+ on the right side, and 2+ on the left side.       Posterior tibial pulses are 2+ on the  right side, and 2+ on the left side.  Pulmonary/Chest: Effort normal and breath sounds normal. No accessory muscle usage. Not tachypneic. No respiratory distress. She has no decreased breath sounds. She has no wheezes. She has no rhonchi. She has no rales. She exhibits no tenderness and no bony tenderness.  Abdominal: Soft. Bowel sounds are normal. She exhibits no distension and no mass. There is tenderness in the epigastric area, periumbilical area and left upper quadrant. There is guarding and CVA tenderness ( left). There is no rigidity, no rebound, no tenderness at McBurney's point and negative Murphy's sign.  Musculoskeletal: Normal range of motion. She exhibits no tenderness.  Lymphadenopathy:    She has no cervical adenopathy.  Neurological: She is alert and oriented to person, place, and time. She exhibits normal muscle tone. Coordination normal.  Skin: Skin is warm and dry. No rash noted. No erythema.  Psychiatric: She has a normal mood and affect.    ED Course  Procedures (including critical care time)  Labs Reviewed  CBC WITH DIFFERENTIAL - Abnormal; Notable for the following:    WBC 12.5 (*)    RBC 5.42 (*)    Hemoglobin 11.8 (*)    HCT 34.9 (*)    MCV 64.4 (*)    MCH 21.8 (*)    RDW 17.7 (*)    Platelets 493 (*)    Neutro Abs 8.6 (*)    All other components within normal limits  COMPREHENSIVE METABOLIC PANEL - Abnormal; Notable for the following:    Sodium 134 (*)    Glucose, Bld 295 (*)    Albumin 3.3 (*)    All other components within normal limits  URINALYSIS, ROUTINE W REFLEX MICROSCOPIC - Abnormal; Notable for the following:    APPearance CLOUDY (*)    Glucose, UA >1000 (*)    Hgb urine dipstick MODERATE (*)    All other components within normal limits  URINE MICROSCOPIC-ADD ON - Abnormal;  Notable for the following:    Squamous Epithelial / LPF MANY (*)    All other components within normal limits  GLUCOSE, CAPILLARY - Abnormal; Notable for the following:     Glucose-Capillary 187 (*)    All other components within normal limits  URINE CULTURE  LIPASE, BLOOD  POCT PREGNANCY, URINE   Ct Abdomen Pelvis Wo Contrast  08/15/2012  *RADIOLOGY REPORT*  Clinical Data: Nausea, emesis.  CT ABDOMEN AND PELVIS WITHOUT CONTRAST  Technique:  Multidetector CT imaging of the abdomen and pelvis was performed following the standard protocol without intravenous contrast.  Comparison: 12/17/2011  Findings: Limited images through the lung bases demonstrate no significant appreciable abnormality. The heart size is within normal limits. No pleural or pericardial effusion.  Organ abnormality/lesion detection is limited in the absence of intravenous contrast. Within this limitation, unremarkable liver, spleen, pancreas, biliary system, adrenal glands.  Symmetric renal size.  The no hydronephrosis or hydroureter.  No urinary tract calculi.  No CT evidence for colitis.  Normal appendix.  Tiny hiatal hernia and mild thickening of the distal esophagus again noted.  Small bowel loops are normal course and caliber.  No free intraperitoneal air or fluid.  No lymphadenopathy.  Normal caliber aorta and branch vessels.  Thin-walled bladder.  Probable fibroid uterus. Trace fluid/stranding within the pelvis.  Pelvic sidewall lymph nodes measuring up to 1 cm short axis, nonspecific.  L5 S1 degenerative disc disease and disc bulge resulting in moderate central canal narrowing.  No acute osseous finding.  IMPRESSION: No acute process identified by unenhanced CT.  Fibroid uterus suspected.  Trace fluid within the pelvis may be physiologic. Correlate with pelvic ultrasound if concerned for acute pelvic pathology.   Original Report Authenticated By: Jearld Lesch, M.D.      1. NAUSEA AND VOMITING   2. Chronic hepatitis B   3. LEUKOCYTOSIS, CHRONIC   4. Poorly controlled type 2 diabetes mellitus   5. BACK PAIN   6. FIBROMYALGIA   7. Abdominal pain, left lower quadrant       MDM  Danae N  Cabiness presents with chronic abdominal pain.  Difference today includes L flank pain.  Will evaluate for kidney stone.  All other symptoms are the same as pts usual gastroparesis symptoms.  Patient is nontoxic, nonseptic appearing, in no apparent distress.  Patient's pain and other symptoms adequately managed in emergency department.  Fluid bolus given.  Labs, imaging and vitals reviewed.  I personally reviewed the imaging tests through PACS system.  I reviewed available ER/hospitalization records through the EMR.  Patient does not meet the SIRS or Sepsis criteria.  On repeat exam patient does not have a surgical abdomin and there are nor peritoneal signs.  No indication of appendicitis, bowel obstruction, bowel perforation, cholecystitis, diverticulitis, PID or ectopic pregnancy.  Pt with hyperglycemia and without DKA.  Pt CBG decreased with fluid.  Pt tolerating fluids > 6oz.  Patient discharged home with symptomatic treatment and given strict instructions for follow-up with their GI physician.  I have also discussed reasons to return immediately to the ER.  Patient expresses understanding and agrees with plan.   Dierdre Forth, PA-C 08/16/12 0037  Dierdre Forth, PA-C 08/16/12 [id number]

## 2012-08-16 NOTE — ED Notes (Signed)
Pt unable to find ride home.  Explained to pt that she will have to wait in lobby while attempting to find ride d/t multiple patients waiting to be seen by MD.  Pt aware and will be moving out shortly.

## 2012-08-18 LAB — URINE CULTURE: Colony Count: >=100000

## 2012-08-19 NOTE — ED Provider Notes (Signed)
Medical screening examination/treatment/procedure(s) were performed by non-physician practitioner and as supervising physician I was immediately available for consultation/collaboration.   Loren Racer, MD 08/19/12 1346

## 2012-08-28 IMAGING — US US ABDOMEN COMPLETE
1 series · 14 of 25 positions shown · non-contrast
Comparison: None.

CLINICAL DATA: Abdominal pain and nausea.

COMPLETE ABDOMINAL ULTRASOUND

[Series 1: us abdomen complete · 0.30mm/px · 14 of 52 slices shown]
[im 1/52]
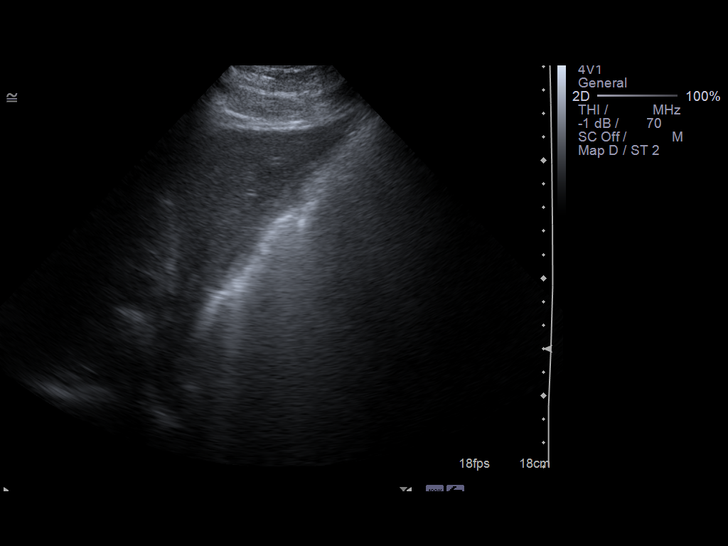
[im 5/52]
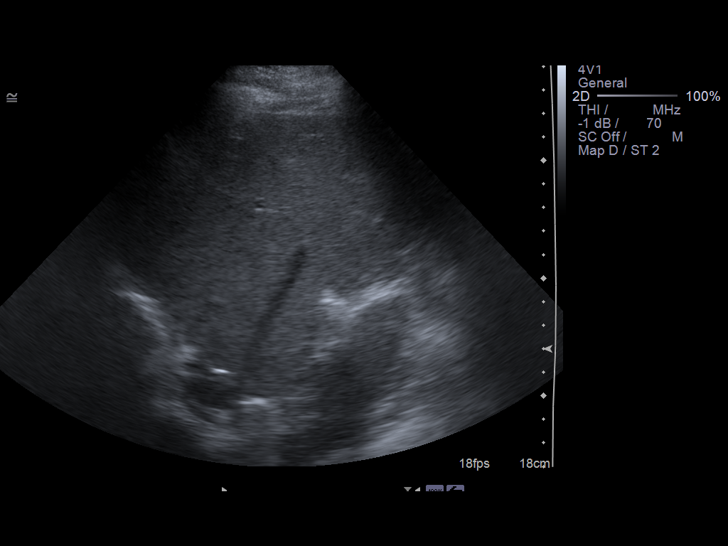
[im 9/52]
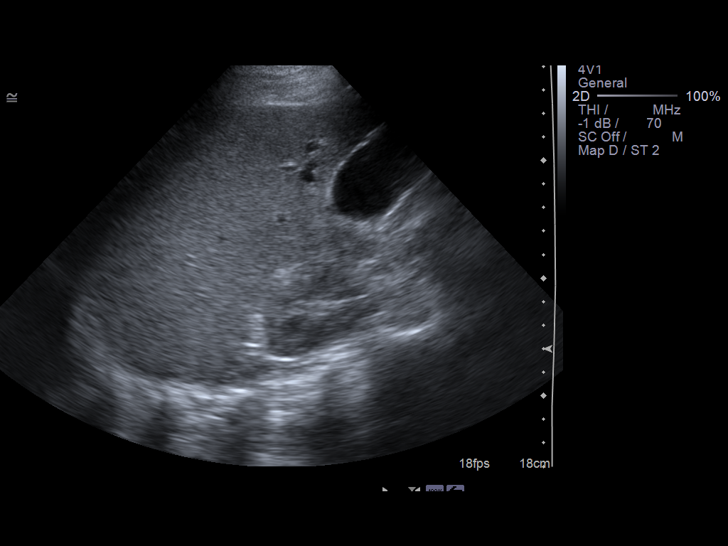
[im 13/52]
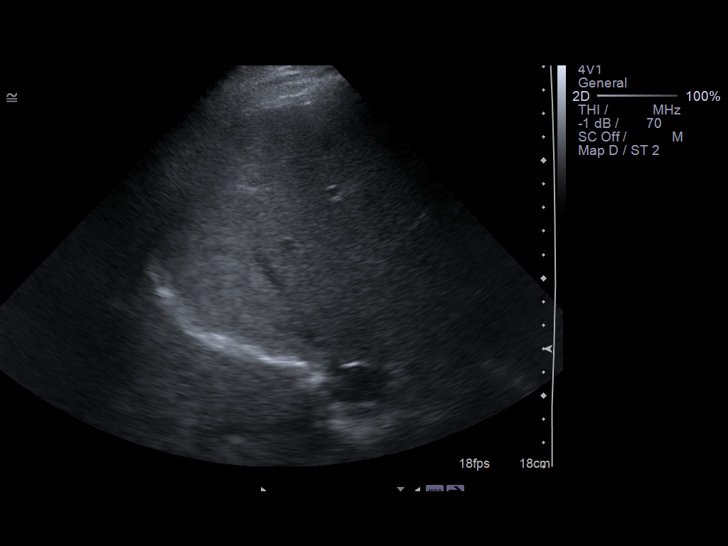
[im 18/52]
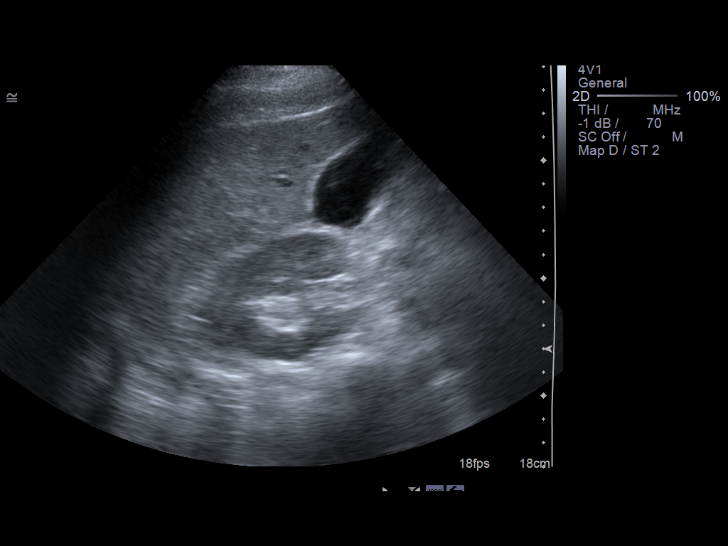
[im 20/52]
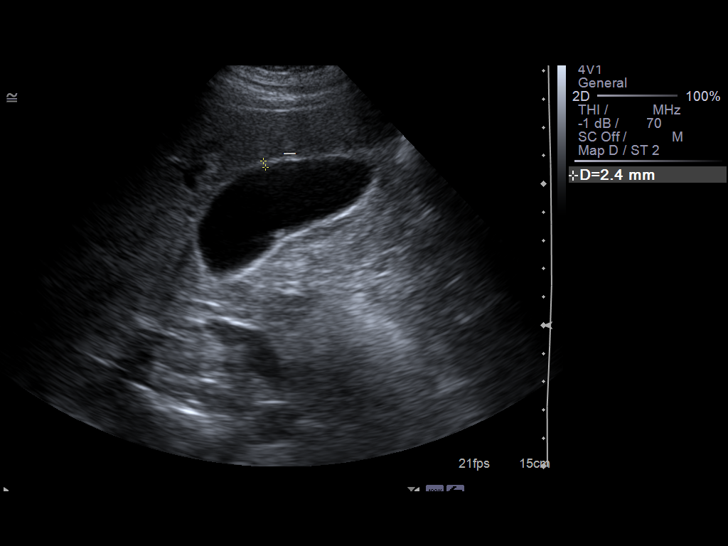
[im 24/52]
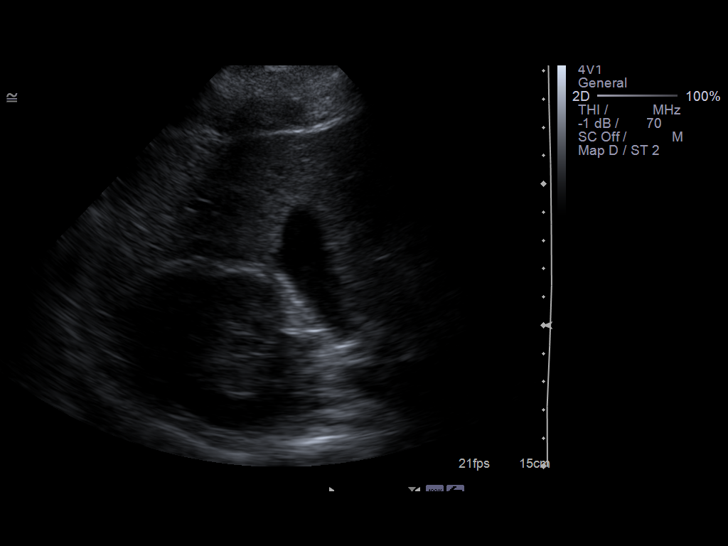
[im 28/52]
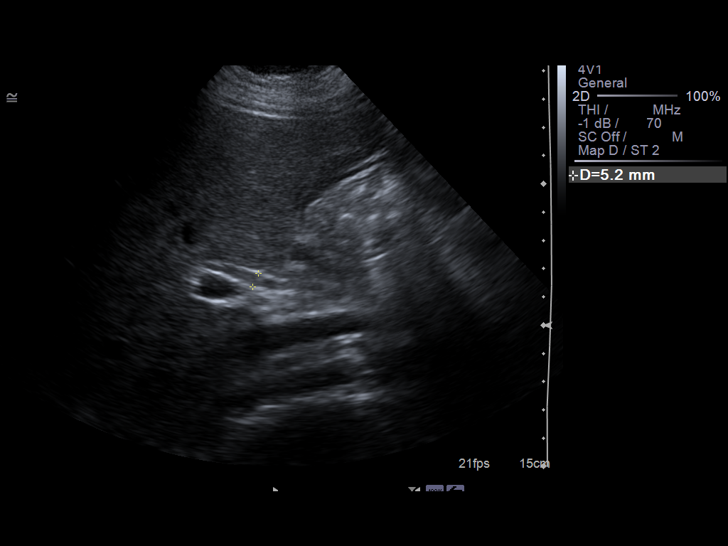
[im 32/52]
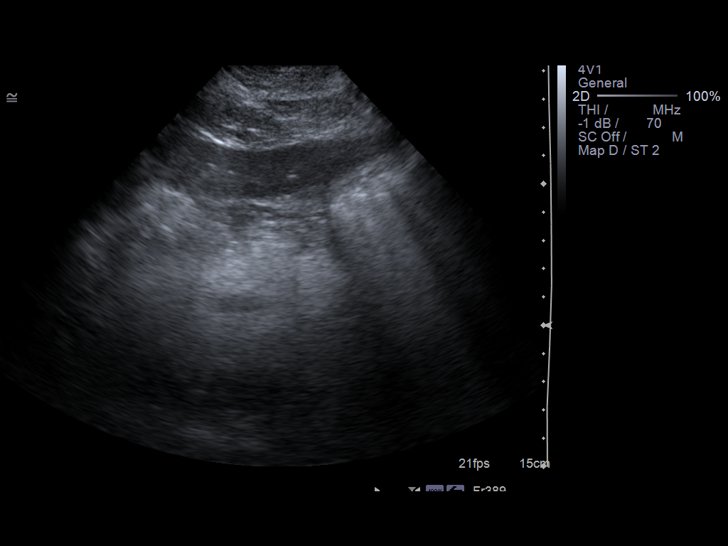
[im 35/52]
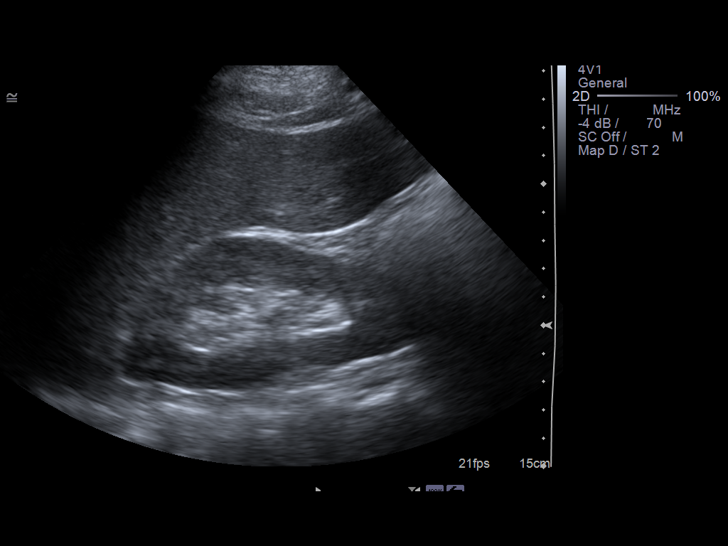
[im 39/52]
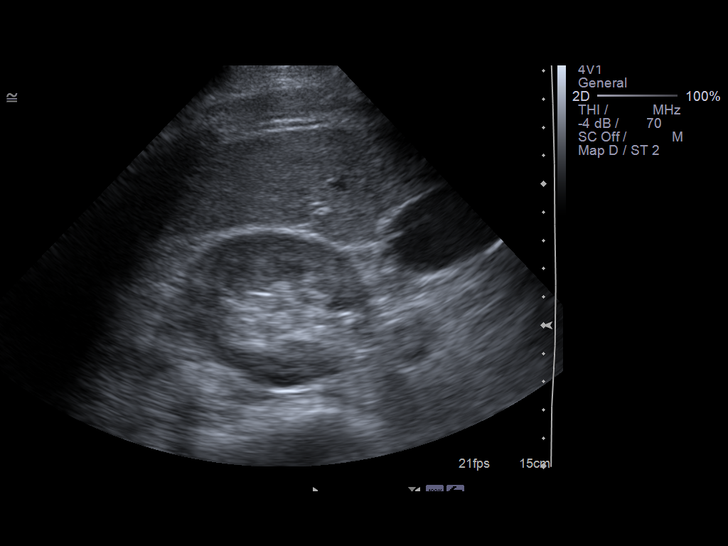
[im 43/52]
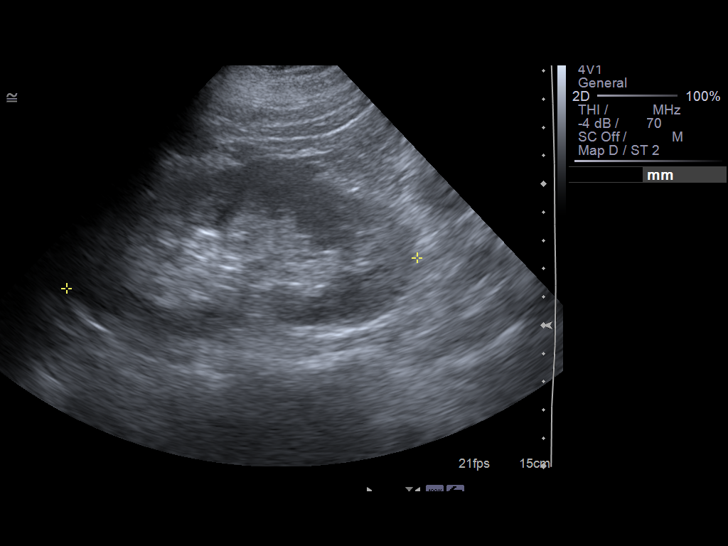
[im 47/52]
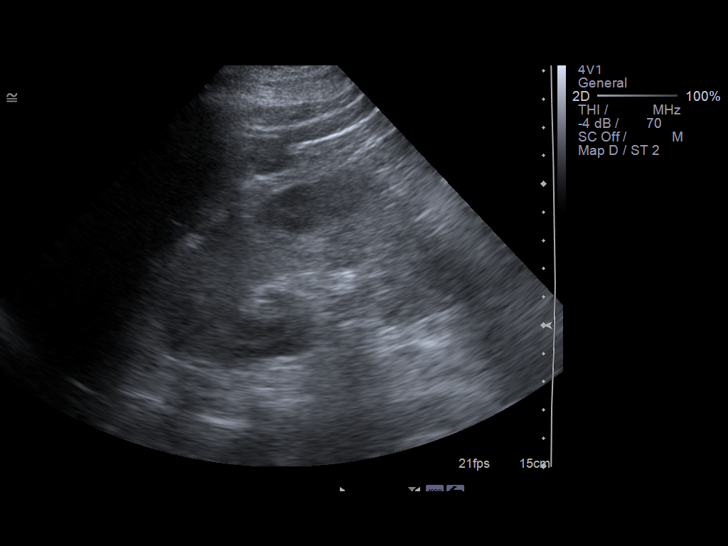
[im 52/52]
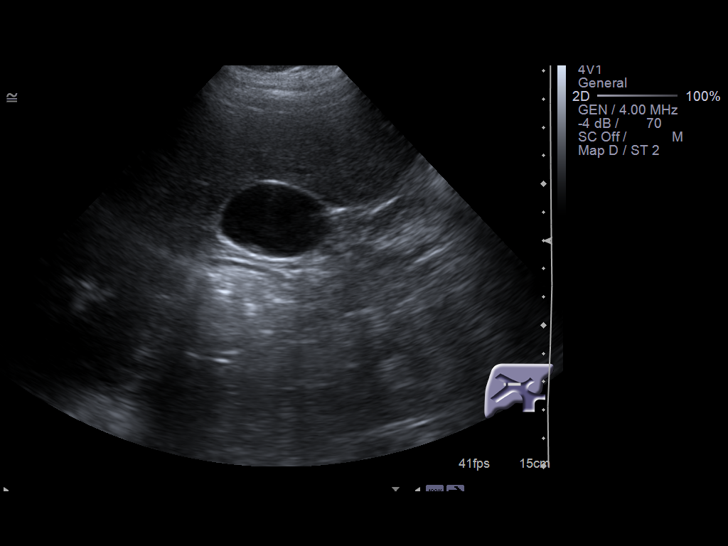

[14 of 25 positions shown; findings below may reference images not displayed]

FINDINGS: Gallbladder:  0.5 cm echogenic structure along the wall of
gallbladder could represent a small polyp.  No evidence for
gallstones.  No evidence for gallbladder wall thickening.  Negative
for a sonographic Murphy's sign.

Common bile duct:  Measures 0.5 cm.

Liver:  No focal lesion identified.  Within normal limits in
parenchymal echogenicity.

IVC:  Appears normal.

Pancreas:  Limited evaluation due to bowel gas.

Spleen:  Measures 11.1 cm.

Right Kidney:  Measures 12.2 cm without hydronephrosis.  Normal
renal echotexture.

Left Kidney:  Normal echotexture and measures 12.5 cm in length
without hydronephrosis.

Abdominal aorta:  No aneurysm identified.
IMPRESSION: 0.5 cm echogenic structure along the gallbladder wall could
represent a polyp.  No evidence for gallstones or acute
inflammation.

## 2012-08-31 IMAGING — CR DG ABDOMEN ACUTE W/ 1V CHEST
3 series · 3 of 3 positions shown · non-contrast
Comparison: Abdominal ultrasound dated 01/23/2010

CLINICAL DATA: 34-year-old with nausea, vomiting and abdominal
pain.

ACUTE ABDOMEN SERIES (ABDOMEN 2 VIEW & CHEST 1 VIEW)

[w chest pa]
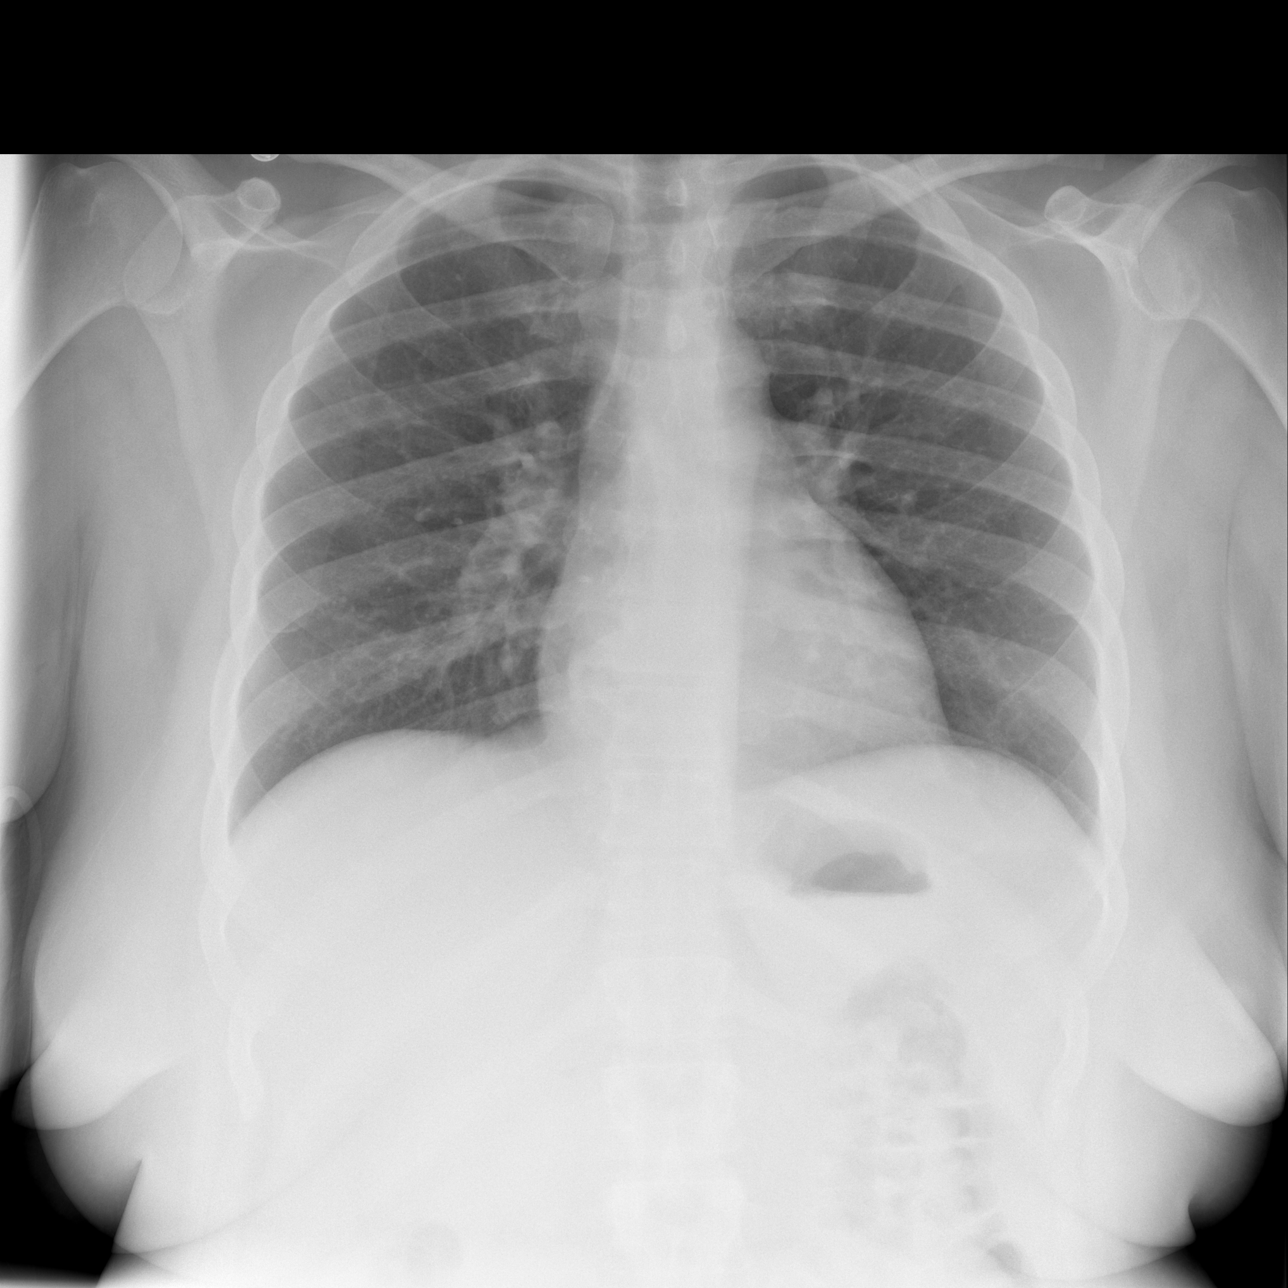

[w abdomen upright]
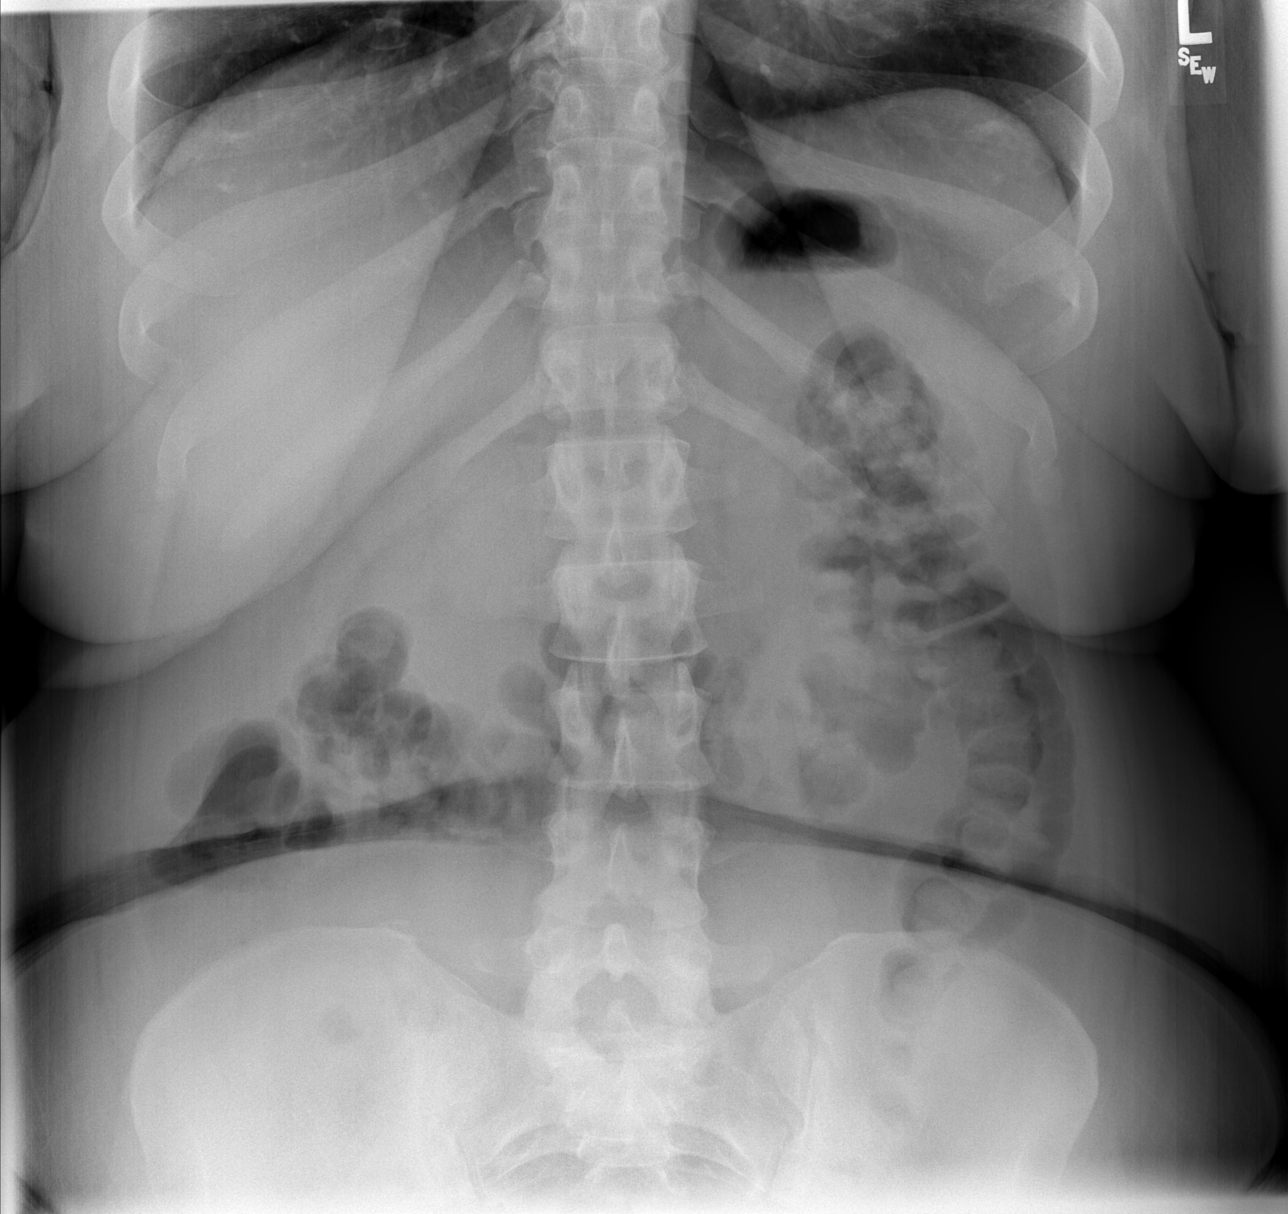

[t abdomen supine]
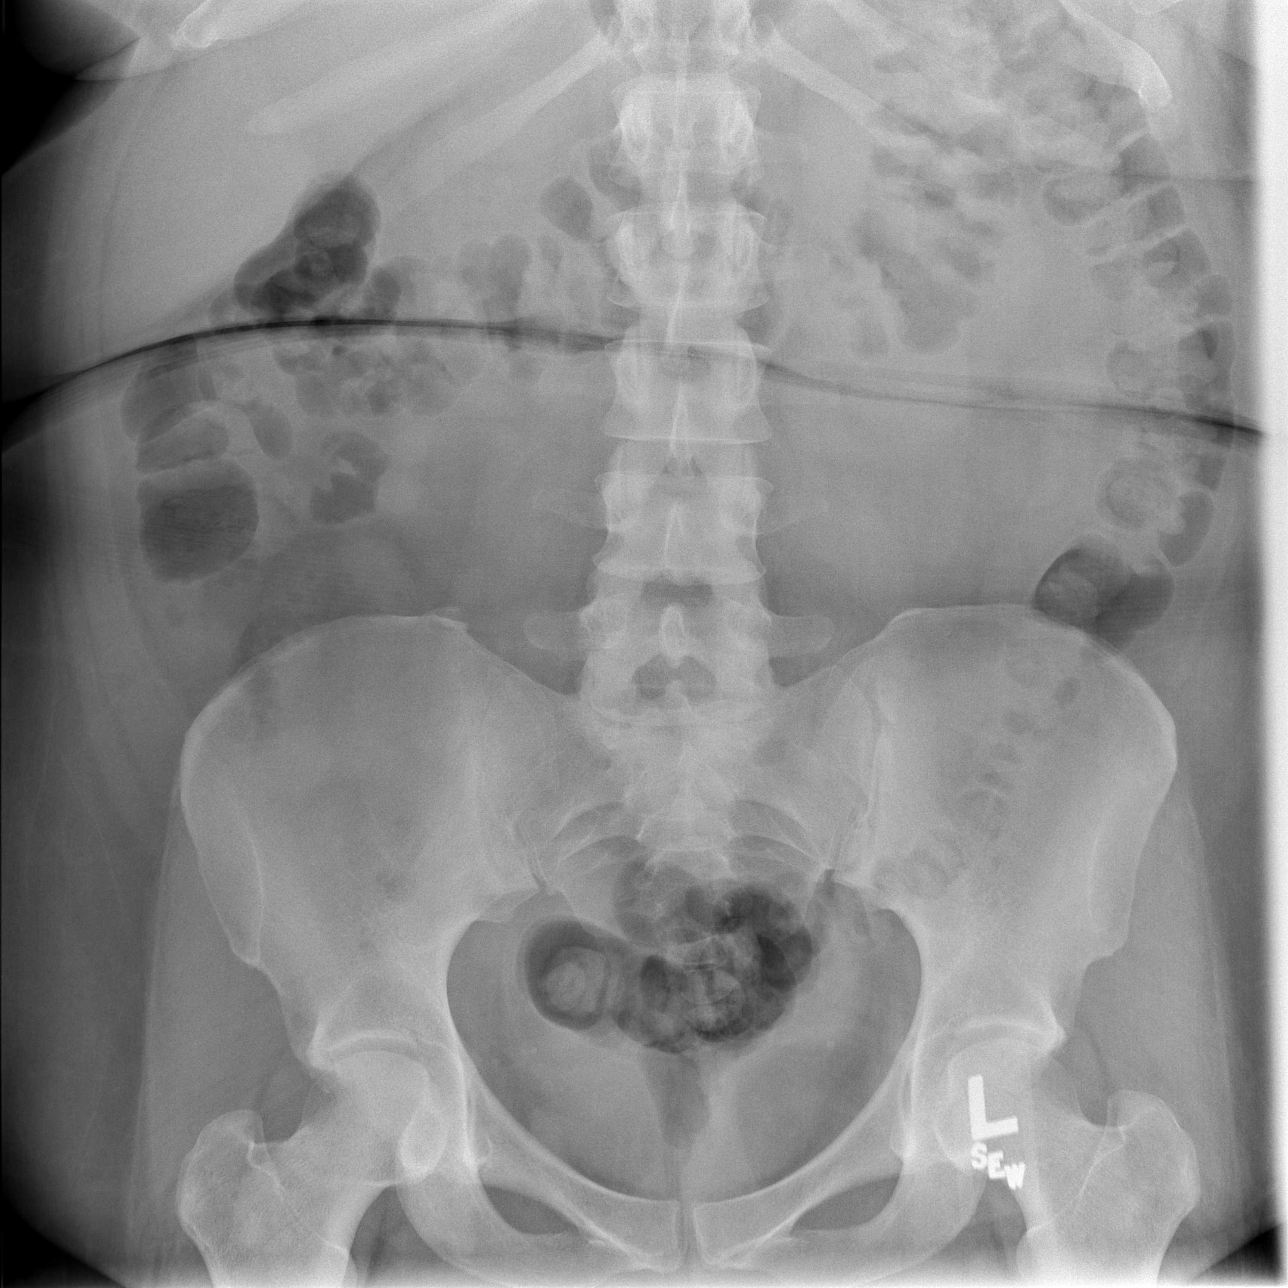

[3 of 3 positions shown; findings below may reference images not displayed]

FINDINGS: Chest radiograph demonstrates clear lungs.  No evidence
of free air.  There is gas in the colon.  No large abdominal
calcifications.  Bony structures are intact.  There are small
phleboliths in the pelvis. Heart and mediastinum are normal.
Trachea is midline.
IMPRESSION: No acute chest findings.

Nonobstructive bowel gas pattern.

## 2012-09-22 ENCOUNTER — Encounter (HOSPITAL_COMMUNITY): Payer: Self-pay | Admitting: *Deleted

## 2012-09-22 ENCOUNTER — Emergency Department (HOSPITAL_COMMUNITY)
Admission: EM | Admit: 2012-09-22 | Discharge: 2012-09-22 | Disposition: A | Discharge status: Home / Self Care | Payer: Medicaid Other | Attending: Emergency Medicine | Admitting: Emergency Medicine

## 2012-09-22 DIAGNOSIS — J45901 Unspecified asthma with (acute) exacerbation: Secondary | ICD-10-CM | POA: Insufficient documentation

## 2012-09-22 DIAGNOSIS — Z8742 Personal history of other diseases of the female genital tract: Secondary | ICD-10-CM | POA: Insufficient documentation

## 2012-09-22 DIAGNOSIS — R079 Chest pain, unspecified: Secondary | ICD-10-CM

## 2012-09-22 DIAGNOSIS — R630 Anorexia: Secondary | ICD-10-CM | POA: Insufficient documentation

## 2012-09-22 DIAGNOSIS — R5381 Other malaise: Secondary | ICD-10-CM | POA: Insufficient documentation

## 2012-09-22 DIAGNOSIS — Z87891 Personal history of nicotine dependence: Secondary | ICD-10-CM | POA: Insufficient documentation

## 2012-09-22 DIAGNOSIS — R109 Unspecified abdominal pain: Secondary | ICD-10-CM

## 2012-09-22 DIAGNOSIS — E669 Obesity, unspecified: Secondary | ICD-10-CM | POA: Insufficient documentation

## 2012-09-22 DIAGNOSIS — Z8719 Personal history of other diseases of the digestive system: Secondary | ICD-10-CM | POA: Insufficient documentation

## 2012-09-22 DIAGNOSIS — Z8669 Personal history of other diseases of the nervous system and sense organs: Secondary | ICD-10-CM | POA: Insufficient documentation

## 2012-09-22 DIAGNOSIS — Z8619 Personal history of other infectious and parasitic diseases: Secondary | ICD-10-CM | POA: Insufficient documentation

## 2012-09-22 DIAGNOSIS — IMO0001 Reserved for inherently not codable concepts without codable children: Secondary | ICD-10-CM | POA: Insufficient documentation

## 2012-09-22 DIAGNOSIS — L0291 Cutaneous abscess, unspecified: Secondary | ICD-10-CM

## 2012-09-22 DIAGNOSIS — Z862 Personal history of diseases of the blood and blood-forming organs and certain disorders involving the immune mechanism: Secondary | ICD-10-CM | POA: Insufficient documentation

## 2012-09-22 DIAGNOSIS — R1032 Left lower quadrant pain: Secondary | ICD-10-CM | POA: Insufficient documentation

## 2012-09-22 DIAGNOSIS — F3289 Other specified depressive episodes: Secondary | ICD-10-CM | POA: Insufficient documentation

## 2012-09-22 DIAGNOSIS — E785 Hyperlipidemia, unspecified: Secondary | ICD-10-CM | POA: Insufficient documentation

## 2012-09-22 DIAGNOSIS — R112 Nausea with vomiting, unspecified: Secondary | ICD-10-CM | POA: Insufficient documentation

## 2012-09-22 DIAGNOSIS — R21 Rash and other nonspecific skin eruption: Secondary | ICD-10-CM | POA: Insufficient documentation

## 2012-09-22 DIAGNOSIS — I10 Essential (primary) hypertension: Secondary | ICD-10-CM | POA: Insufficient documentation

## 2012-09-22 DIAGNOSIS — Z87440 Personal history of urinary (tract) infections: Secondary | ICD-10-CM | POA: Insufficient documentation

## 2012-09-22 DIAGNOSIS — Z79899 Other long term (current) drug therapy: Secondary | ICD-10-CM | POA: Insufficient documentation

## 2012-09-22 DIAGNOSIS — I2510 Atherosclerotic heart disease of native coronary artery without angina pectoris: Secondary | ICD-10-CM | POA: Insufficient documentation

## 2012-09-22 DIAGNOSIS — R519 Headache, unspecified: Secondary | ICD-10-CM

## 2012-09-22 DIAGNOSIS — N61 Mastitis without abscess: Secondary | ICD-10-CM | POA: Insufficient documentation

## 2012-09-22 DIAGNOSIS — E119 Type 2 diabetes mellitus without complications: Secondary | ICD-10-CM | POA: Insufficient documentation

## 2012-09-22 DIAGNOSIS — Z8673 Personal history of transient ischemic attack (TIA), and cerebral infarction without residual deficits: Secondary | ICD-10-CM | POA: Insufficient documentation

## 2012-09-22 DIAGNOSIS — I252 Old myocardial infarction: Secondary | ICD-10-CM | POA: Insufficient documentation

## 2012-09-22 DIAGNOSIS — Z872 Personal history of diseases of the skin and subcutaneous tissue: Secondary | ICD-10-CM | POA: Insufficient documentation

## 2012-09-22 DIAGNOSIS — G8929 Other chronic pain: Secondary | ICD-10-CM | POA: Insufficient documentation

## 2012-09-22 DIAGNOSIS — G43909 Migraine, unspecified, not intractable, without status migrainosus: Secondary | ICD-10-CM | POA: Insufficient documentation

## 2012-09-22 DIAGNOSIS — Z794 Long term (current) use of insulin: Secondary | ICD-10-CM | POA: Insufficient documentation

## 2012-09-22 DIAGNOSIS — K219 Gastro-esophageal reflux disease without esophagitis: Secondary | ICD-10-CM | POA: Insufficient documentation

## 2012-09-22 DIAGNOSIS — H53149 Visual discomfort, unspecified: Secondary | ICD-10-CM | POA: Insufficient documentation

## 2012-09-22 DIAGNOSIS — F411 Generalized anxiety disorder: Secondary | ICD-10-CM | POA: Insufficient documentation

## 2012-09-22 DIAGNOSIS — F329 Major depressive disorder, single episode, unspecified: Secondary | ICD-10-CM | POA: Insufficient documentation

## 2012-09-22 LAB — BASIC METABOLIC PANEL
BUN: 12 mg/dL (ref 6–23)
CO2: 23 mEq/L (ref 19–32)
Calcium: 9.5 mg/dL (ref 8.4–10.5)
Chloride: 100 mEq/L (ref 96–112)
Creatinine, Ser: 0.79 mg/dL (ref 0.50–1.10)
GFR calc Af Amer: >90 mL/min (ref >90)
GFR calc non Af Amer: >90 mL/min (ref >90)
Glucose, Bld: 430 mg/dL — ABNORMAL HIGH (ref 70–99)
Potassium: 4.2 mEq/L (ref 3.5–5.1)
Sodium: 134 mEq/L — ABNORMAL LOW (ref 135–145)

## 2012-09-22 LAB — CBC
HCT: 30.8 % — ABNORMAL LOW (ref 36.0–46.0)
Hemoglobin: 10.3 g/dL — ABNORMAL LOW (ref 12.0–15.0)
MCH: 21.8 pg — ABNORMAL LOW (ref 26.0–34.0)
MCHC: 33.4 g/dL (ref 30.0–36.0)
MCV: 65.3 fL — ABNORMAL LOW (ref 78.0–100.0)
Platelets: 481 10*3/uL — ABNORMAL HIGH (ref 150–400)
RBC: 4.72 MIL/uL (ref 3.87–5.11)
RDW: 18 % — ABNORMAL HIGH (ref 11.5–15.5)
WBC: 14.2 10*3/uL — ABNORMAL HIGH (ref 4.0–10.5)

## 2012-09-22 LAB — POCT I-STAT TROPONIN I: Troponin i, poc: 0 ng/mL (ref 0.00–0.08)

## 2012-09-22 IMAGING — CT CT ABD-PEL WO/W CM
2 of 9 series · 12 of 46 positions shown, 19 images · IV contrast (APPLIED)
Comparison: CT abdomen and pelvis 02/01/2010.  Chest and two views
abdomen 02/17/2010.

CLINICAL DATA: Nausea, vomiting and stomach pain.

CT ABDOMEN AND PELVIS WITHOUT AND WITH CONTRAST
TECHNIQUE: Multidetector CT imaging of the abdomen and pelvis was
performed without contrast material in one or both body regions,
followed by contrast material(s) and further sections in one or
both body regions.
Contrast: 100 ml Wmnipaque-3SS.

[Series 2: abd/pelv w/o 5.0 b31f st · axial · non-contrast · 0.84mm/px · z∈[-483,-93]mm · 9 of 98 slices shown, 15 images]
[im 10/98  soft-tissue]
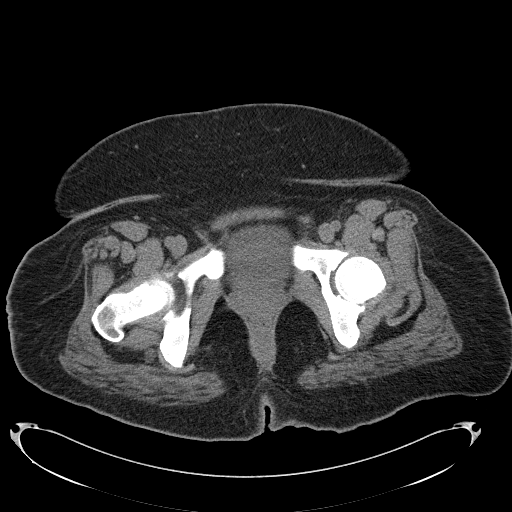
[im 10/98  bone]
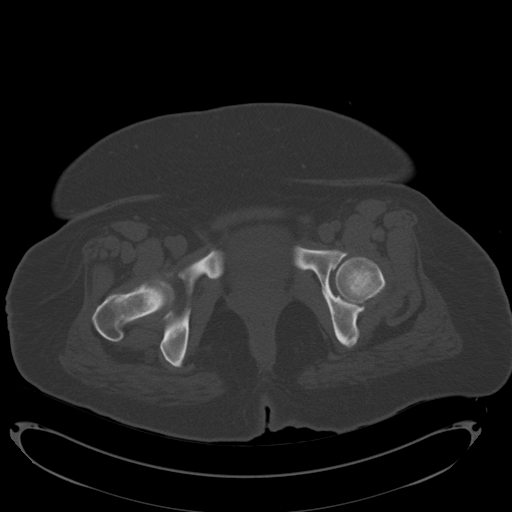
[im 20/98  soft-tissue]
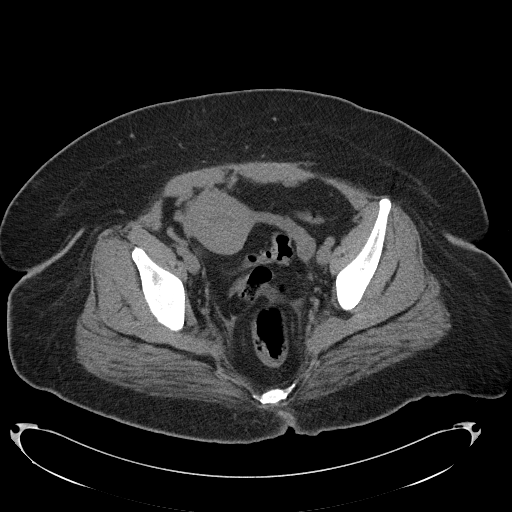
[im 30/98  soft-tissue]
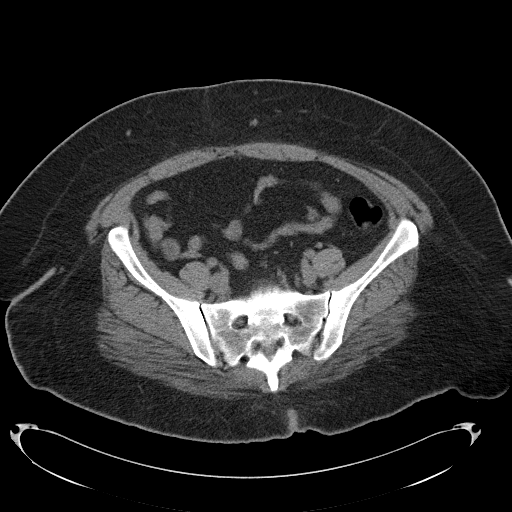
[im 39/98  soft-tissue]
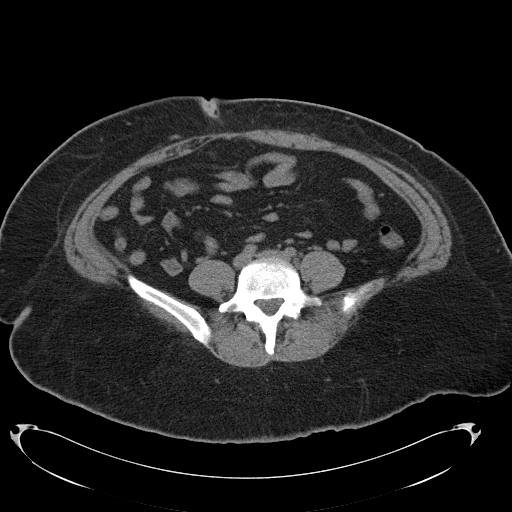
[im 49/98  soft-tissue]
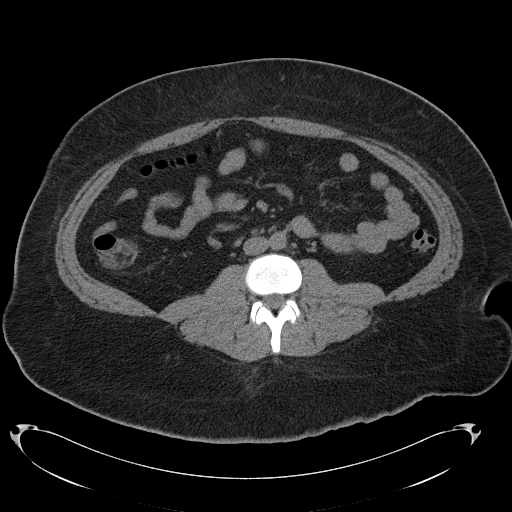
[im 59/98  soft-tissue]
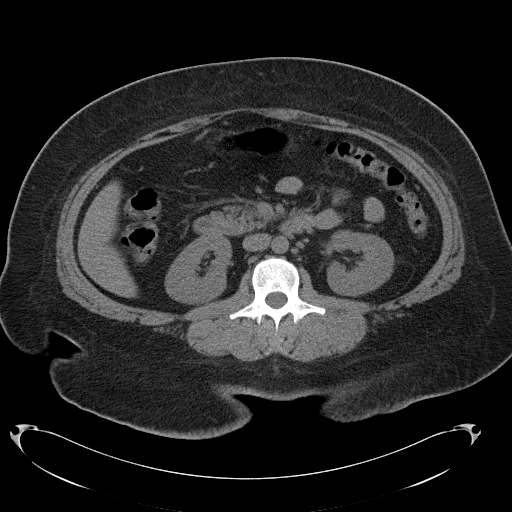
[im 59/98  lung]
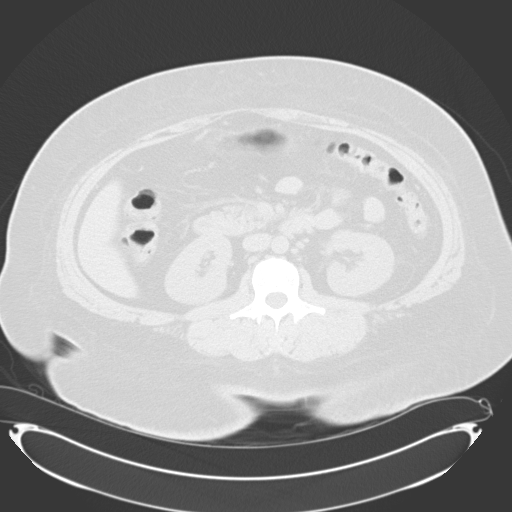
[im 68/98  soft-tissue]
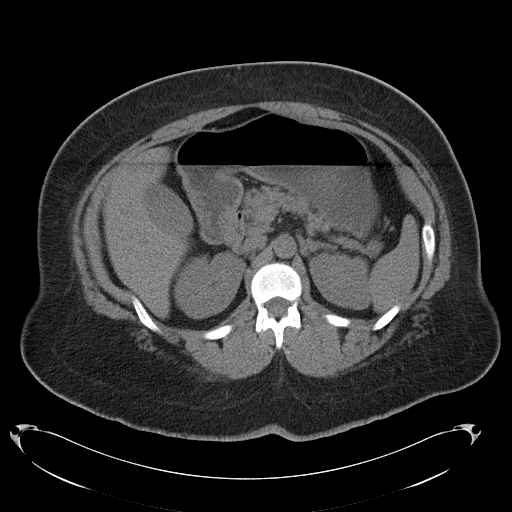
[im 68/98  lung]
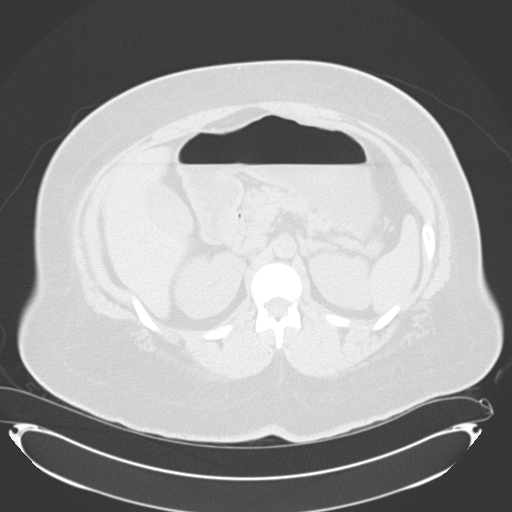
[im 78/98  soft-tissue]
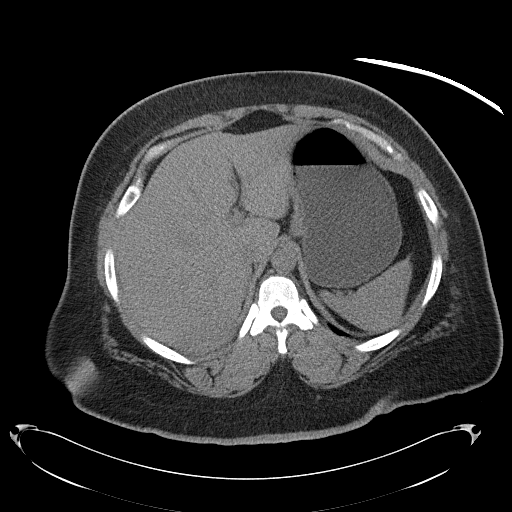
[im 78/98  lung]
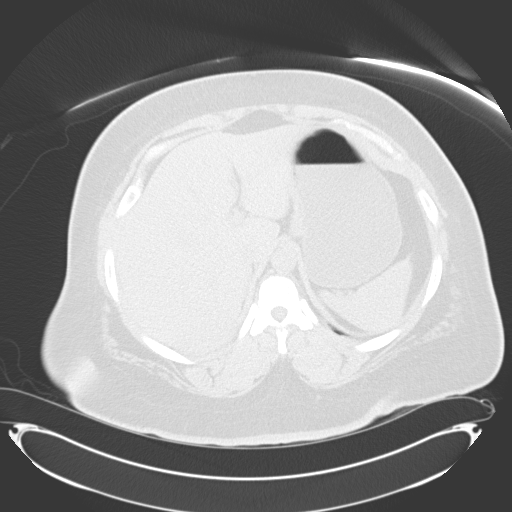
[im 88/98  soft-tissue]
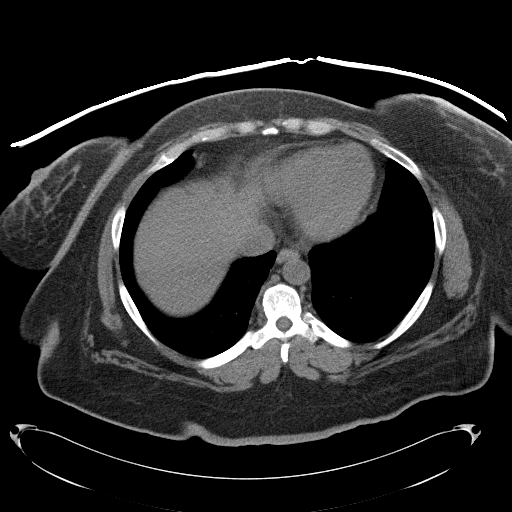
[im 88/98  lung]
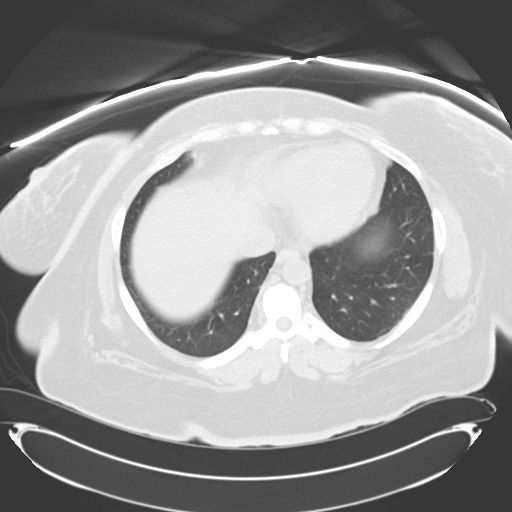
[im 88/98  bone]
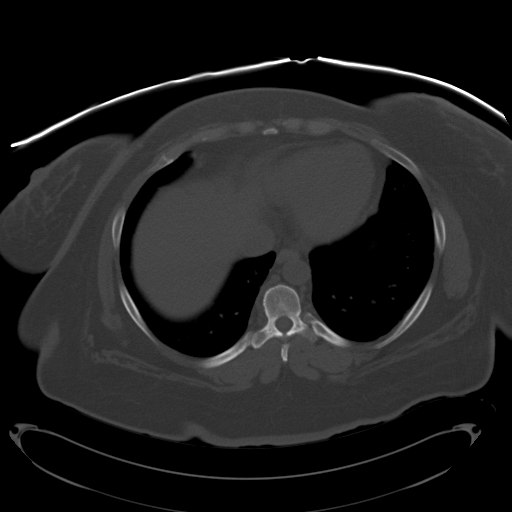

[Series 602: cor wo · coronal · 0.96mm/px · 3 of 151 slices shown, 4 images]
[im 38/151  soft-tissue]
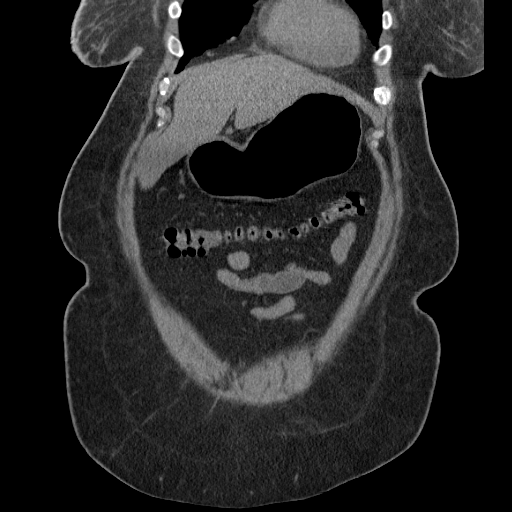
[im 76/151  soft-tissue]
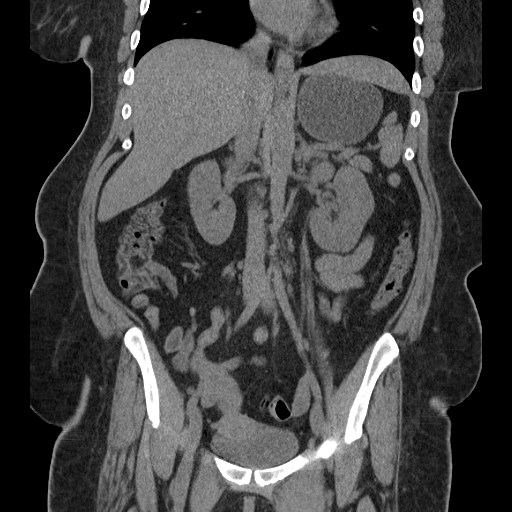
[im 76/151  bone]
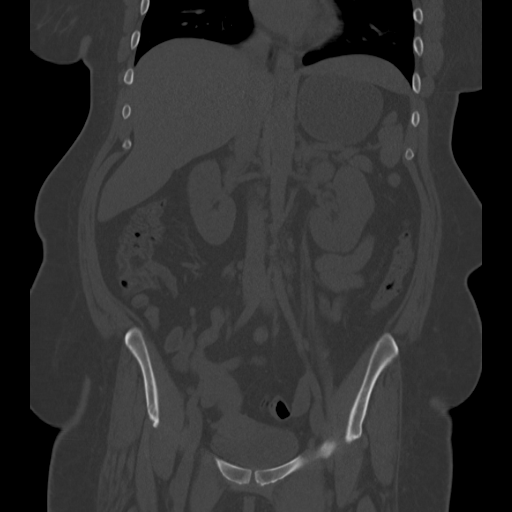
[im 113/151  soft-tissue]
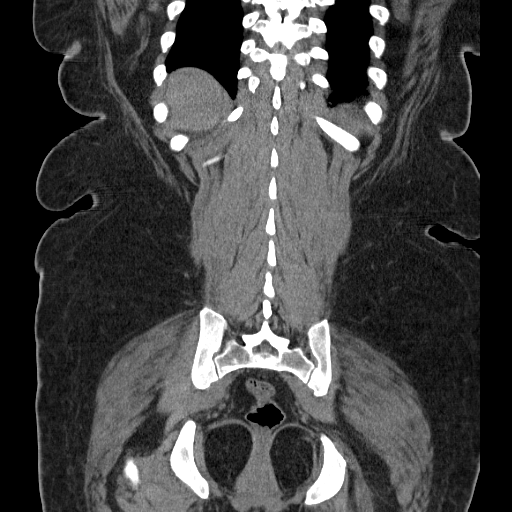

[12 of 46 positions shown; findings below may reference images not displayed]

FINDINGS: Lung bases are clear.  No pleural or pericardial
effusion.

There are no renal or ureteral stones.  The kidneys appear normal.
The liver, gallbladder, spleen, pancreas and adrenal glands all
appear normal.  Stomach, small and large bowel and appendix are
unremarkable.  Uterus is eccentric to the right but otherwise
unremarkable.  Adnexa appear normal.  No lymphadenopathy or fluid.
No focal bony abnormality.
IMPRESSION: Negative exam.

## 2012-09-22 IMAGING — CR DG ABDOMEN ACUTE W/ 1V CHEST
3 series · 3 of 3 positions shown · non-contrast
Comparison: 02/01/2010.

CLINICAL DATA: Nausea and vomiting.  Stomach pain.

ACUTE ABDOMEN SERIES (ABDOMEN 2 VIEW & CHEST 1 VIEW)

[w chest pa]
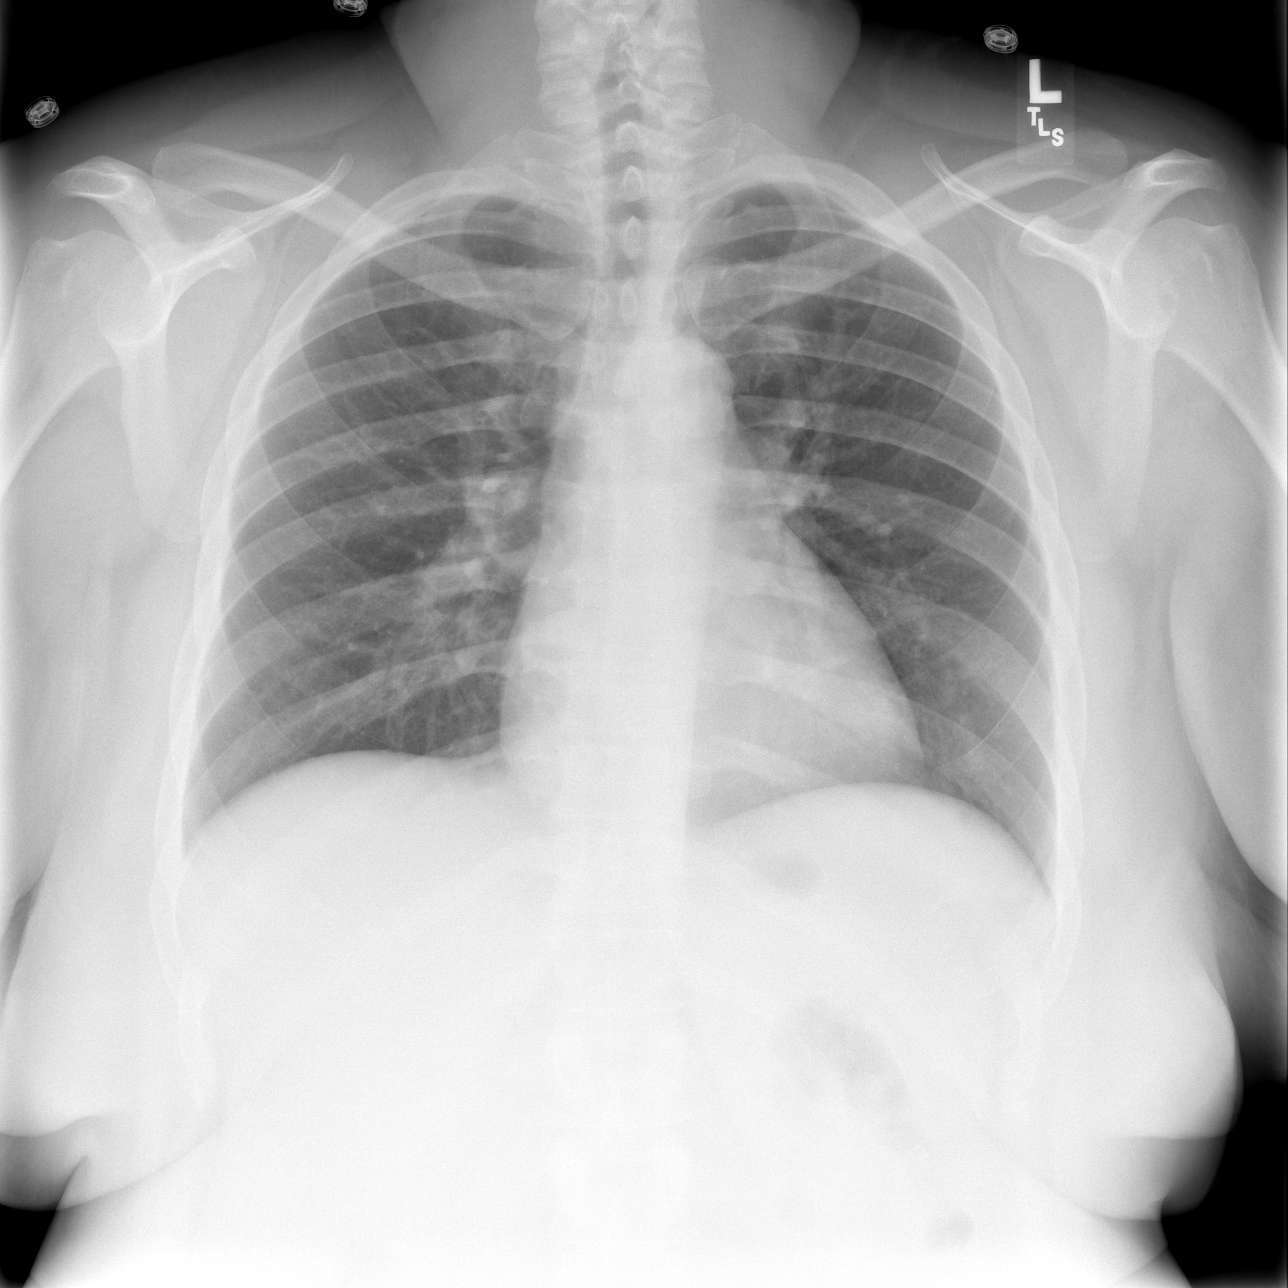

[w abdomen upright]
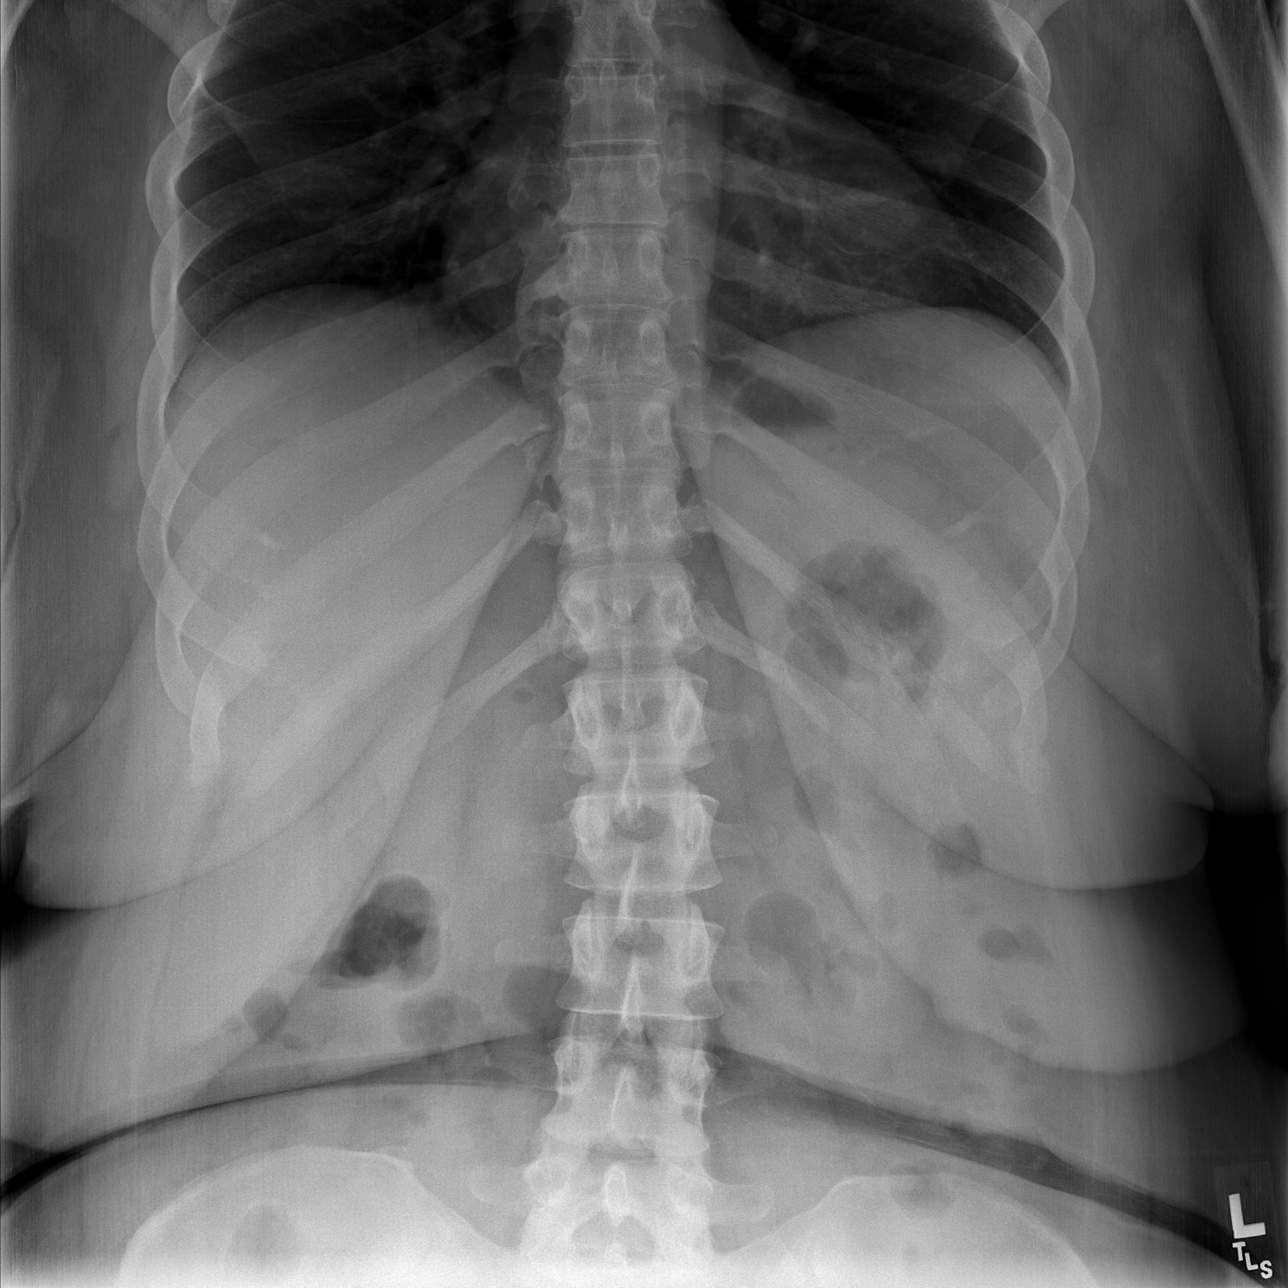

[t abdomen supine]
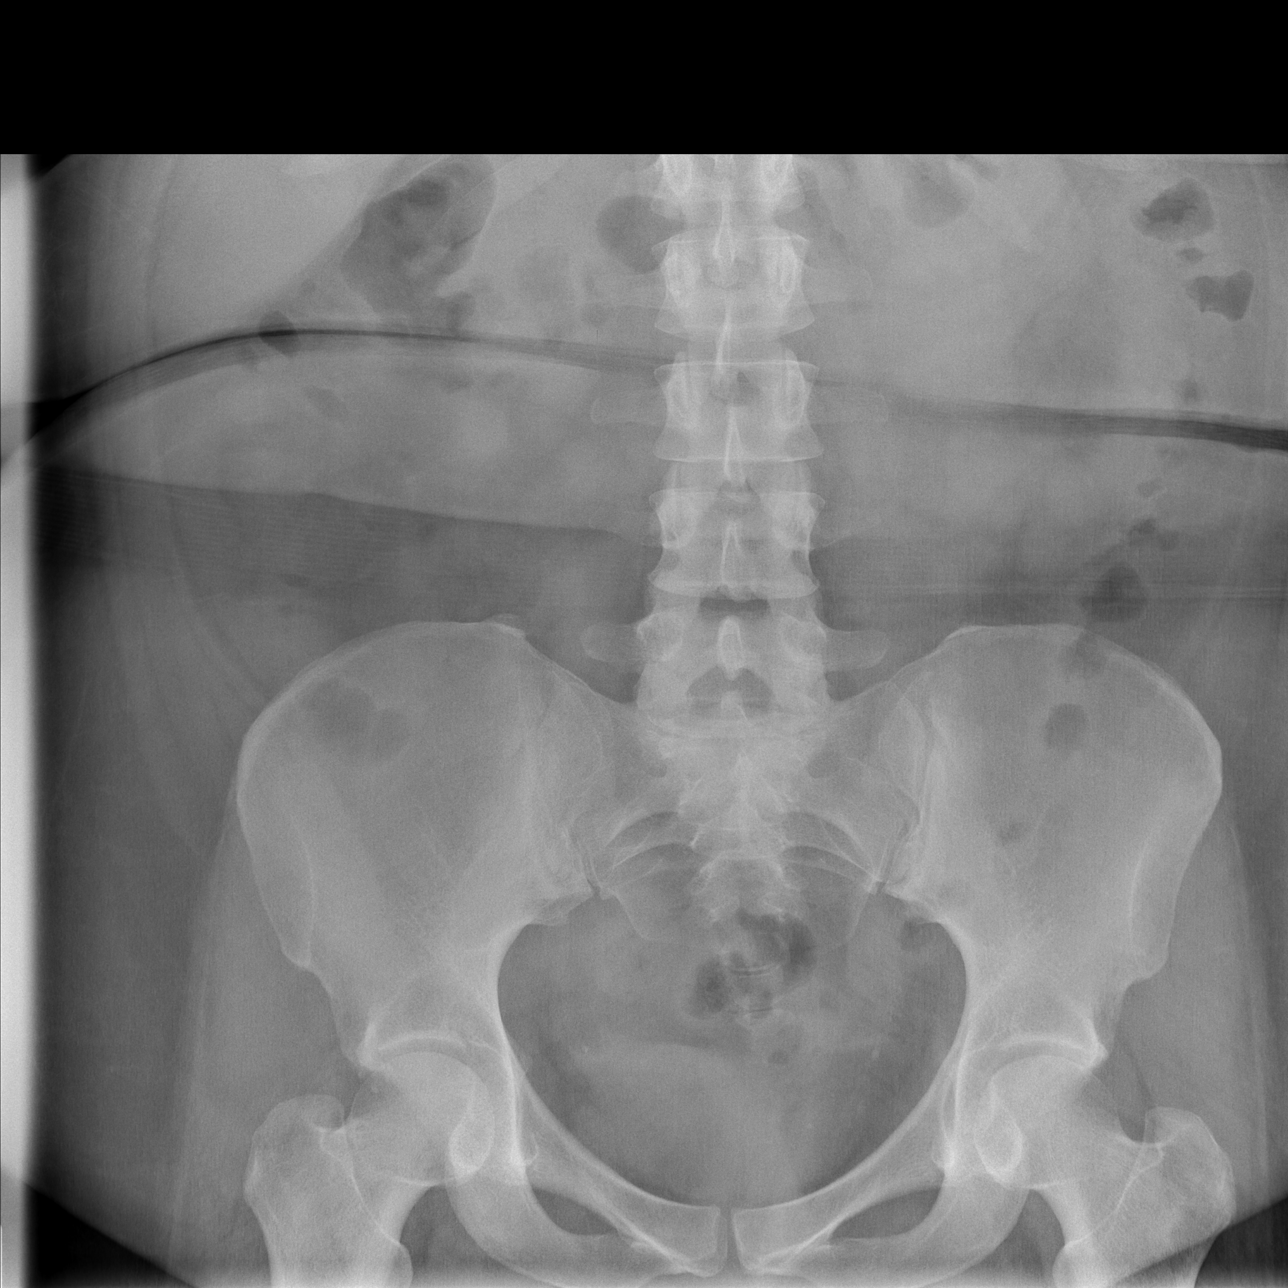

[3 of 3 positions shown; findings below may reference images not displayed]

FINDINGS: Lungs clear.  Cardiopericardial silhouette within normal
limits.  Trachea midline.  No airspace disease or effusion.  No
free air underneath the hemidiaphragms.  Bowel gas pattern is
nonobstructed and within normal limits.  No organomegaly.
IMPRESSION: No acute abnormality.

## 2012-09-22 MED ORDER — MORPHINE SULFATE 4 MG/ML IJ SOLN
4.0000 mg | Freq: Once | INTRAMUSCULAR | Status: AC
  Administered 2012-09-22: 4 mg via INTRAVENOUS
  Filled 2012-09-22: qty 1

## 2012-09-22 MED ORDER — PROMETHAZINE HCL 25 MG PO TABS
25.0000 mg | ORAL_TABLET | ORAL | Status: AC
  Administered 2012-09-22: 25 mg via ORAL
  Filled 2012-09-22: qty 1

## 2012-09-22 MED ORDER — PROMETHAZINE HCL 25 MG/ML IJ SOLN
25.0000 mg | Freq: Once | INTRAMUSCULAR | Status: DC
  Filled 2012-09-22: qty 1

## 2012-09-22 MED ORDER — SODIUM CHLORIDE 0.9 % IV BOLUS (SEPSIS)
1000.0000 mL | Freq: Once | INTRAVENOUS | Status: AC
  Administered 2012-09-22: 1000 mL via INTRAVENOUS

## 2012-09-22 MED ORDER — HYDROCODONE-ACETAMINOPHEN 5-325 MG PO TABS: ORAL_TABLET | ORAL | Status: DC

## 2012-09-22 MED ORDER — HYDROMORPHONE HCL PF 1 MG/ML IJ SOLN: 1.0000 mg | Freq: Once | INTRAMUSCULAR | Status: DC

## 2012-09-22 MED ORDER — HYDROMORPHONE HCL PF 1 MG/ML IJ SOLN
1.0000 mg | Freq: Once | INTRAMUSCULAR | Status: AC
  Administered 2012-09-22: 1 mg via INTRAVENOUS
  Filled 2012-09-22: qty 1

## 2012-09-22 MED ORDER — METOCLOPRAMIDE HCL 5 MG/ML IJ SOLN
10.0000 mg | Freq: Once | INTRAMUSCULAR | Status: AC
  Administered 2012-09-22: 10 mg via INTRAVENOUS
  Filled 2012-09-22: qty 2

## 2012-09-22 MED ORDER — DIPHENHYDRAMINE HCL 50 MG/ML IJ SOLN
25.0000 mg | Freq: Once | INTRAMUSCULAR | Status: AC
  Administered 2012-09-22: 25 mg via INTRAVENOUS
  Filled 2012-09-22: qty 1

## 2012-09-22 NOTE — ED Provider Notes (Signed)
Medical screening examination/treatment/procedure(s) were conducted as a shared visit with non-physician practitioner(s) and myself.  I personally evaluated the patient during the encounter  Pt seen and examined--abdoimnal pain has been constant and is worked up recently with a negative CAT scan. Headache is not concern for subarachnoid hemorrhage and likely a migraine. She does have a right breast abscess (. We'll still do cardiac workup and patient likely to be discharged home  Toy Baker, MD 09/22/12 1118

## 2012-09-22 NOTE — ED Notes (Signed)
PA draining abscess under right breast

## 2012-09-22 NOTE — ED Provider Notes (Signed)
History     CSN: [id number]  Arrival date & time 09/22/12  1028   First MD Initiated Contact with Patient 09/22/12 1034      Chief Complaint  Patient presents with  . Chest Pain  . Shortness of Breath  . Migraine  . Abscess    (Consider location/radiation/quality/duration/timing/severity/associated sxs/prior treatment) HPI Pt is a 37yo female with hx of chronic LLQ pain associated with nausea and vomiting presenting with multiple complaints. Pt c/o gradual onset, constant sharp and aching pain in LLQ, with n/v. No diareah.  States pain is unchanged since last workup in ED on 5/24 where she had a negative workup. Needs to schedule appointment with Dr. Elnoria Howard.  Pt also c/o gradual onset, aching and throbbing headache in temples for past 3 days, states it feels like regular migraine but more intense.  Pt admits to not eating an drinking well due to abdominal pain pain.  Pt also c/o gradual onset, waxing and waning moderate centralized chest pain that is a "squeezing" sensation for past 3 days, radiates down both arms causing hands to cramp up but also states she believes chest pain could be due to heart burn.  She has nexium but has not tried it because she was not having pain up until 3 days ago.   Pt also c/o hard and painful, burning and aching mass under her right breast that started 3 months ago but progressively worsened.  Reports having small boils on breast in past but they typically heal on their own.  This mass has only worsened. Mass is tender to touch, too painful to put on bra, located near anterior bra line. Denies nipple discharge or discharge from mass.  Denies fever, cough, or diarrhea.  Past Medical History  Diagnosis Date  . Thrombocytosis     Hem/Onc suggested 2/2 chronic hepatits and/or iron deficiency anemia  . Iron deficiency anemia   . N&V (nausea and vomiting)     Chronic. Unclear etiology with multiple admission and ED visits. CT abdomen with and without contrast  (02/2011)  showed no acute process. Gastic Emptying scan (01/2010) was normal. Ultrasound of the abdomen was within normal limits. Hepatitis B viral load was undectable. HIV NR. EGD - gastritis, Hpylori + s/p Rx  . Abdominal pain   . Hypertension   . Hyperlipidemia   . Diabetes mellitus type 2, uncontrolled, with complications   . Recurrent boils   . Back pain   . OSA (obstructive sleep apnea)   . 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 Cath 12/10- no restenosis or significant CAD progression  . Irregular menses     Small ovarian follicles seen on CT(9/06)  . History of pyelonephritis     H/o GrpB Pyelonephritis (9/06) and UTI- 07/11- E.Coli, 12/10- GBS  . Abscess of tunica vaginalis     10/09- Abundant S. aureus- sensitive to all abx  . Obesity   . Asthma   . Gastritis   . Depression   . Hepatitis B, chronic     Hep BeAb+,Hep B cAb+ & Hep BsAg+ (9/06)  . Peripheral neuropathy   . Fibromyalgia   . GERD (gastroesophageal reflux disease)   . Migraine   . Anxiety   . Gastroparesis     secondary to poorly controlled DM, last emptying study performed 01/2010  was normal but may be falsely positive as pt was on  reglan  . Blood dyscrasia   . H/O: CVA (cerebrovascular accident)     history of remote right cerebellar infarct noted on head CT at least since 10/2011    Past Surgical History  Procedure Laterality Date  . Cesarean section  1997    Family History  Problem Relation Age of Onset  . Diabetes Father     History  Substance Use Topics  . Smoking status: Former Smoker    Types: Cigarettes    Quit date: 04/24/1996  . Smokeless tobacco: Never Used     Comment: quit smoking cigarettes age 75  . Alcohol Use: Yes     Comment: occasionally    OB History   Grav Para Term Preterm Abortions TAB SAB Ect Mult Living                  Review of Systems    Constitutional: Positive for appetite change ( decreased) and fatigue. Negative for fever.  HENT: Negative for neck pain and neck stiffness.   Eyes: Positive for photophobia.  Respiratory: Positive for shortness of breath. Negative for cough and chest tightness.   Cardiovascular: Positive for chest pain (  sharp, epigastrium).  Gastrointestinal: Positive for nausea, vomiting and abdominal pain ( LLQ). Negative for diarrhea, constipation and abdominal distention.  Skin: Positive for color change and wound.       Boil under right breast   Neurological: Positive for headaches.  All other systems reviewed and are negative.    Allergies  Lisinopril  Home Medications   Current Outpatient Rx  Name  Route  Sig  Dispense  Refill  . acetaminophen (TYLENOL) 500 MG tablet   Oral   Take 1,000 mg by mouth every 6 (six) hours as needed for pain.         Marland Kitchen albuterol (PROVENTIL HFA;VENTOLIN HFA) 108 (90 BASE) MCG/ACT inhaler   Inhalation   Inhale 2 puffs into the lungs every 4 (four) hours as needed. For shortness of breath   1 Inhaler   6   . Fluticasone-Salmeterol (ADVAIR) 100-50 MCG/DOSE AEPB   Inhalation   Inhale 1 puff into the lungs every 12 (twelve) hours.   60 each   6   . HYDROcodone-acetaminophen (NORCO/VICODIN) 5-325 MG per tablet      Take 1-2 tabs every 6 hours as needed for pain.   6 tablet   0   . insulin glargine (LANTUS) 100 UNIT/ML injection   Subcutaneous   Inject 0.4 mLs (40 Units total) into the skin at bedtime.   10 mL   3     Lot #  [id number] Exp. Date 8/205  Patient has been  ...   . labetalol (NORMODYNE) 100 MG tablet   Oral   Take 0.5 tablets (50 mg total) by mouth 2 (two) times daily.   60 tablet   0   . omeprazole (PRILOSEC) 40 MG capsule   Oral   Take 1 capsule (40 mg total) by mouth daily.   31 capsule   6   . pregabalin (LYRICA) 75 MG capsule   Oral   Take 75 mg by mouth daily.         . promethazine (PHENERGAN) 12.5 MG tablet    Oral   Take 2 tablets (25 mg total) by mouth every 6 (six) hours as needed for nausea.   30 tablet   0   . promethazine (PHENERGAN) 25 MG suppository   Rectal   Place 1 suppository (25  mg total) rectally every 6 (six) hours as needed for nausea.   12 each   0   . traMADol (ULTRAM) 50 MG tablet   Oral   Take 1-2 tablets (50-100 mg total) by mouth every 6 (six) hours as needed for pain.   60 tablet   0     BP 157/102  Pulse 98  Temp(Src) 98 F (36.7 C) (Oral)  Resp 18  Wt 221 lb (100.245 kg)  BMI 34.61 kg/m2  SpO2 100%  LMP 09/08/2012  Physical Exam  Nursing note and vitals reviewed. Constitutional: She is oriented to person, place, and time. She appears well-developed and well-nourished. No distress.  HENT:  Head: Normocephalic and atraumatic.  Mouth/Throat: Oropharynx is clear and moist. No oropharyngeal exudate.  Eyes: Conjunctivae and EOM are normal. Pupils are equal, round, and reactive to light. Right eye exhibits no discharge. Left eye exhibits no discharge. No scleral icterus.  Neck: Normal range of motion. Neck supple.  No nuchal rigidity or meningeal signs  Cardiovascular: Normal rate, regular rhythm and normal heart sounds.   Pulmonary/Chest: Effort normal and breath sounds normal. No respiratory distress. She has no wheezes. She has no rales. She exhibits no tenderness.  Abdominal: Soft. Bowel sounds are normal. She exhibits no distension and no mass. There is tenderness ( mild, LLQ). There is no rebound and no guarding.  Musculoskeletal: Normal range of motion.  Neurological: She is alert and oriented to person, place, and time. No cranial nerve deficit. Coordination normal.  Skin: Skin is warm and dry. Rash noted. Rash is nodular. She is not diaphoretic. There is erythema.     4cm area of induration and erythema with warmth under right breast.  TTP    ED Course  INCISION AND DRAINAGE Date/Time: 09/22/2012 4:44 PM Performed by: Junius Finner Authorized  by: Junius Finner Consent: Verbal consent obtained. Risks and benefits: risks, benefits and alternatives were discussed Consent given by: patient Patient understanding: patient states understanding of the procedure being performed Patient consent: the patient's understanding of the procedure matches consent given Patient identity confirmed: verbally with patient Type: abscess Body area: trunk Location details: right breast Anesthesia: local infiltration Local anesthetic: lidocaine 1% with epinephrine Anesthetic total: 4 ml Patient sedated: no Scalpel size: 11 Needle gauge: 25. Incision type: single straight Complexity: simple Drainage: purulent and serosanguinous Drainage amount: moderate Wound treatment: wound left open Packing material: 1/2 in iodoform gauze Patient tolerance: Patient tolerated the procedure well with no immediate complications.   (including critical care time)  Labs Reviewed  CBC - Abnormal; Notable for the following:    WBC 14.2 (*)    Hemoglobin 10.3 (*)    HCT 30.8 (*)    MCV 65.3 (*)    MCH 21.8 (*)    RDW 18.0 (*)    Platelets 481 (*)    All other components within normal limits  BASIC METABOLIC PANEL - Abnormal; Notable for the following:    Sodium 134 (*)    Glucose, Bld 430 (*)    All other components within normal limits  POCT I-STAT TROPONIN I   No results found.   Date: 09/22/2012  Rate: 100  Rhythm: sinus tachycardia  QRS Axis: normal  Intervals: normal  ST/T Wave abnormalities: normal  Conduction Disutrbances:none  Narrative Interpretation:   Old EKG Reviewed: unchanged    1. Headache   2. Abscess   3. Chronic abdominal pain   4. Chest pain   5. Nausea and vomiting  MDM  Multiple complaints: HA- not concerned for [facility], likely migraine due to lack of food and fluid intake. Pt mildly tachycardic, could be mild dehydration.  Abd pain: LLQ, chronic. Negative workup with CT on 4/24.  Pt has f/u Dr. Elnoria Howard, GI coming up.   Pt states she was in ED when her original appointment was scheduled.  Needs to reschedule.  Chest pain: low likelihood of CAD, will perform cardiac workup to make sure ad run basic labs. Lesion under right breast: erythremic, TTP, warm to touch and indurated.  Will I&D, see procedure note.   Tx HA with fluids, morphine, benadryl, and reglan.    EKG: sinus tachycardia, no STEMI Labs: unremarkable, mild leukocytosis consistent with presence of abscess   Rx: norco.  F/u with health clinic or pcp in 2 days for removal of packing and wound recheck.  Vitals: unremarkable. Discharged in stable condition.    Discussed pt with attending during ED encounter.    Junius Finner, PA-C 09/23/12 1455

## 2012-09-22 NOTE — ED Notes (Signed)
Pt here with multiple complaints. Reports large, hard mass under right breast which she first noticed 3 weeks ago. Has not been medically treated, states she is in pain from it. Also c/o LLQ abd pain "squeezing, sharp" pain x3 days. Reports nausea and intermittent vomiting. No diarrhea. C/o migraine headache x3 days, pain to bilat temples. Reports blurred vision d/t headache, is sensitive to light. Also c/o bilat swelling to feet and lower legs. States she has numbness at times. Reports centralized chest pain, with "squeezing" sensation, and pain radiates to bilat arms, hands "cramp up". States chest pain causes SOB at times. Hx of MI 6 years ago, states current chest pain does not feel like the MI pain

## 2012-09-26 IMAGING — CR DG CHEST 2V
2 series · 2 of 2 positions shown · non-contrast
Comparison: 11/21/2009

CLINICAL DATA: Nausea, vomiting, chest and abdominal pain,
diabetes, hypertension, history MI

CHEST - 2 VIEW

[w chest pa]
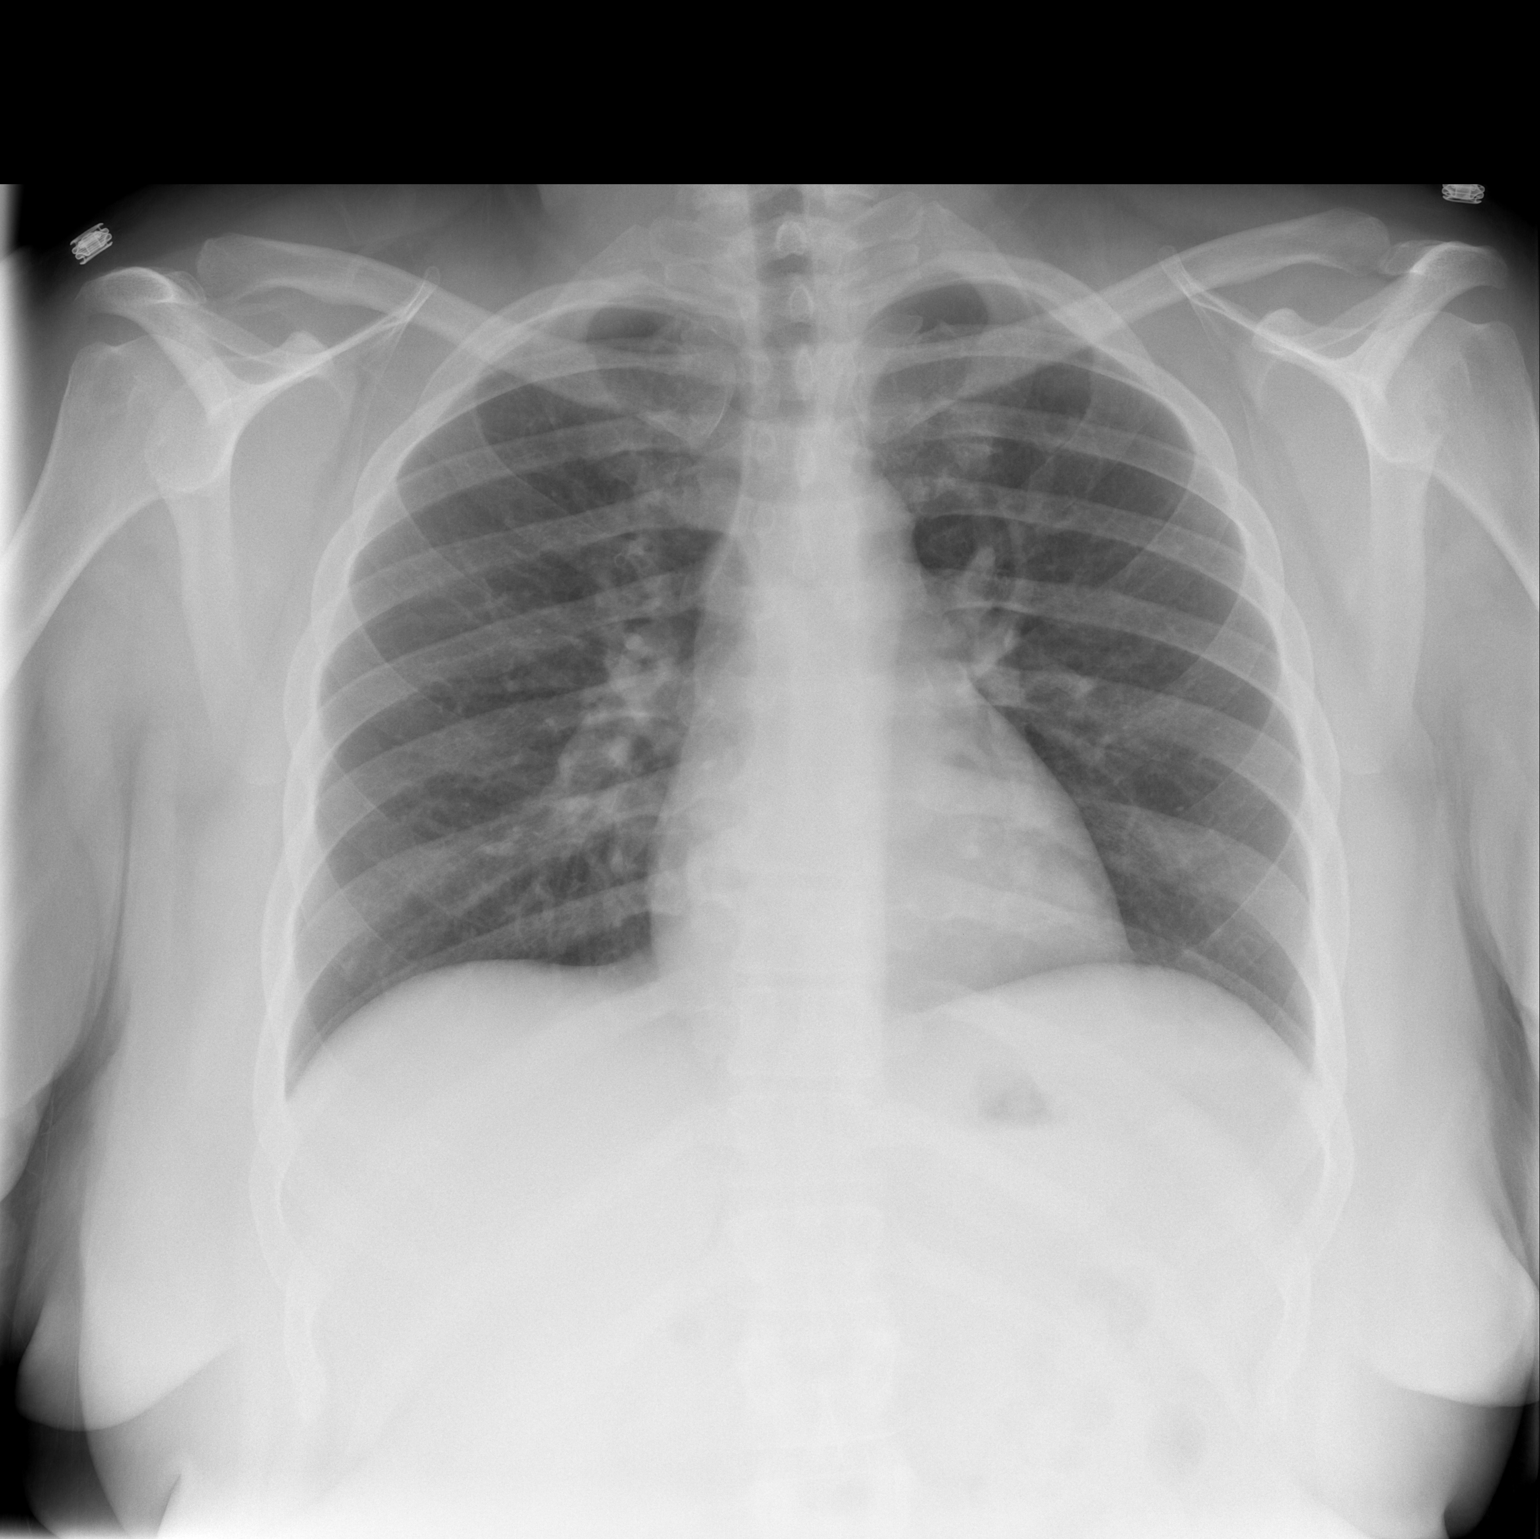

[w chest lat]
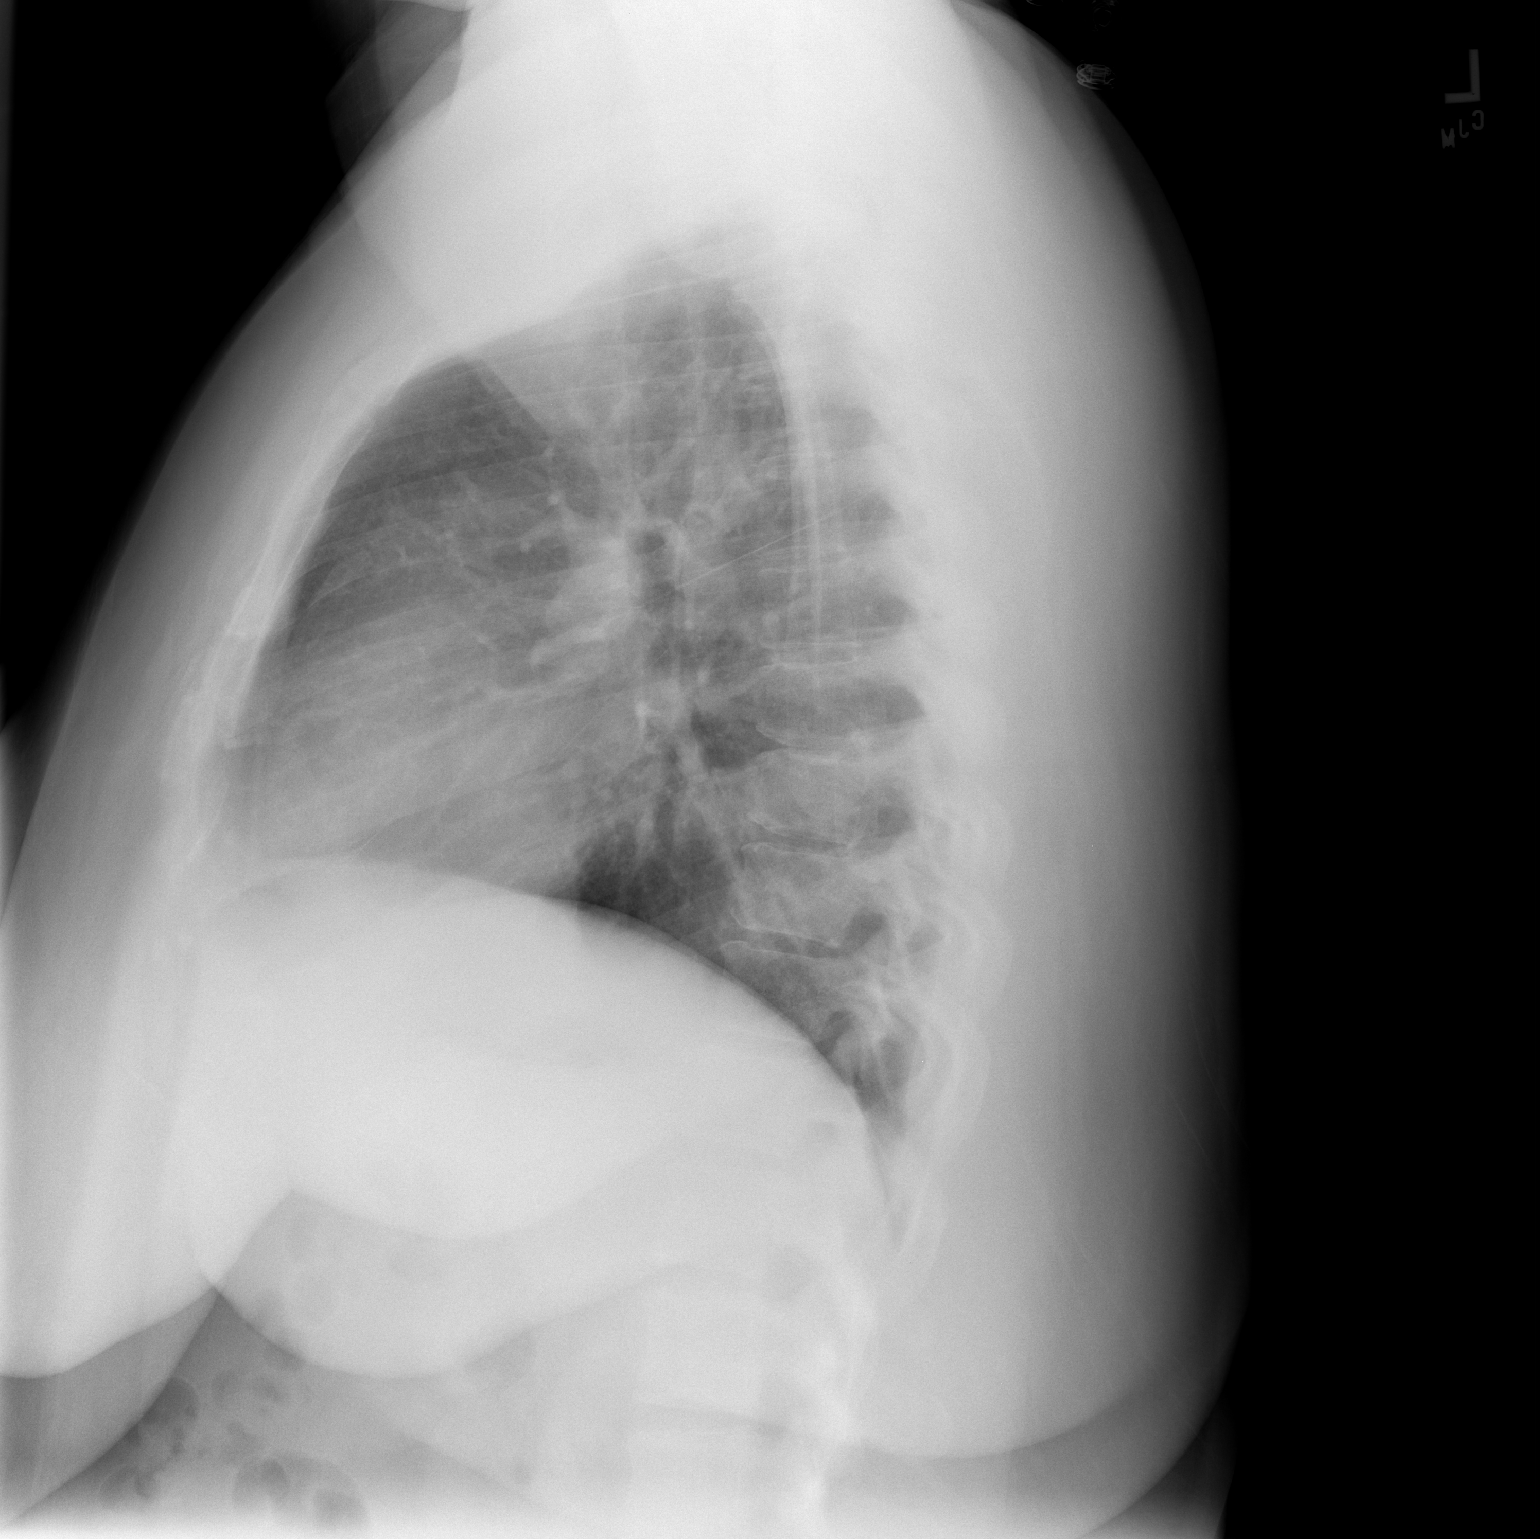

[2 of 2 positions shown; findings below may reference images not displayed]

FINDINGS: Normal heart size, mediastinal contours, and pulmonary vascularity.
Lungs clear.
No pleural effusion or pneumothorax.
Bones unremarkable.
IMPRESSION: No acute abnormalities.

## 2012-09-26 IMAGING — CR DG ABDOMEN 2V
2 series · 2 of 2 positions shown · non-contrast
Comparison: 01/26/2010

CLINICAL DATA: Abdominal pain, nausea, vomiting, diabetes,
hypertension, history MI

ABDOMEN - 2 VIEW

[w abdomen upright]
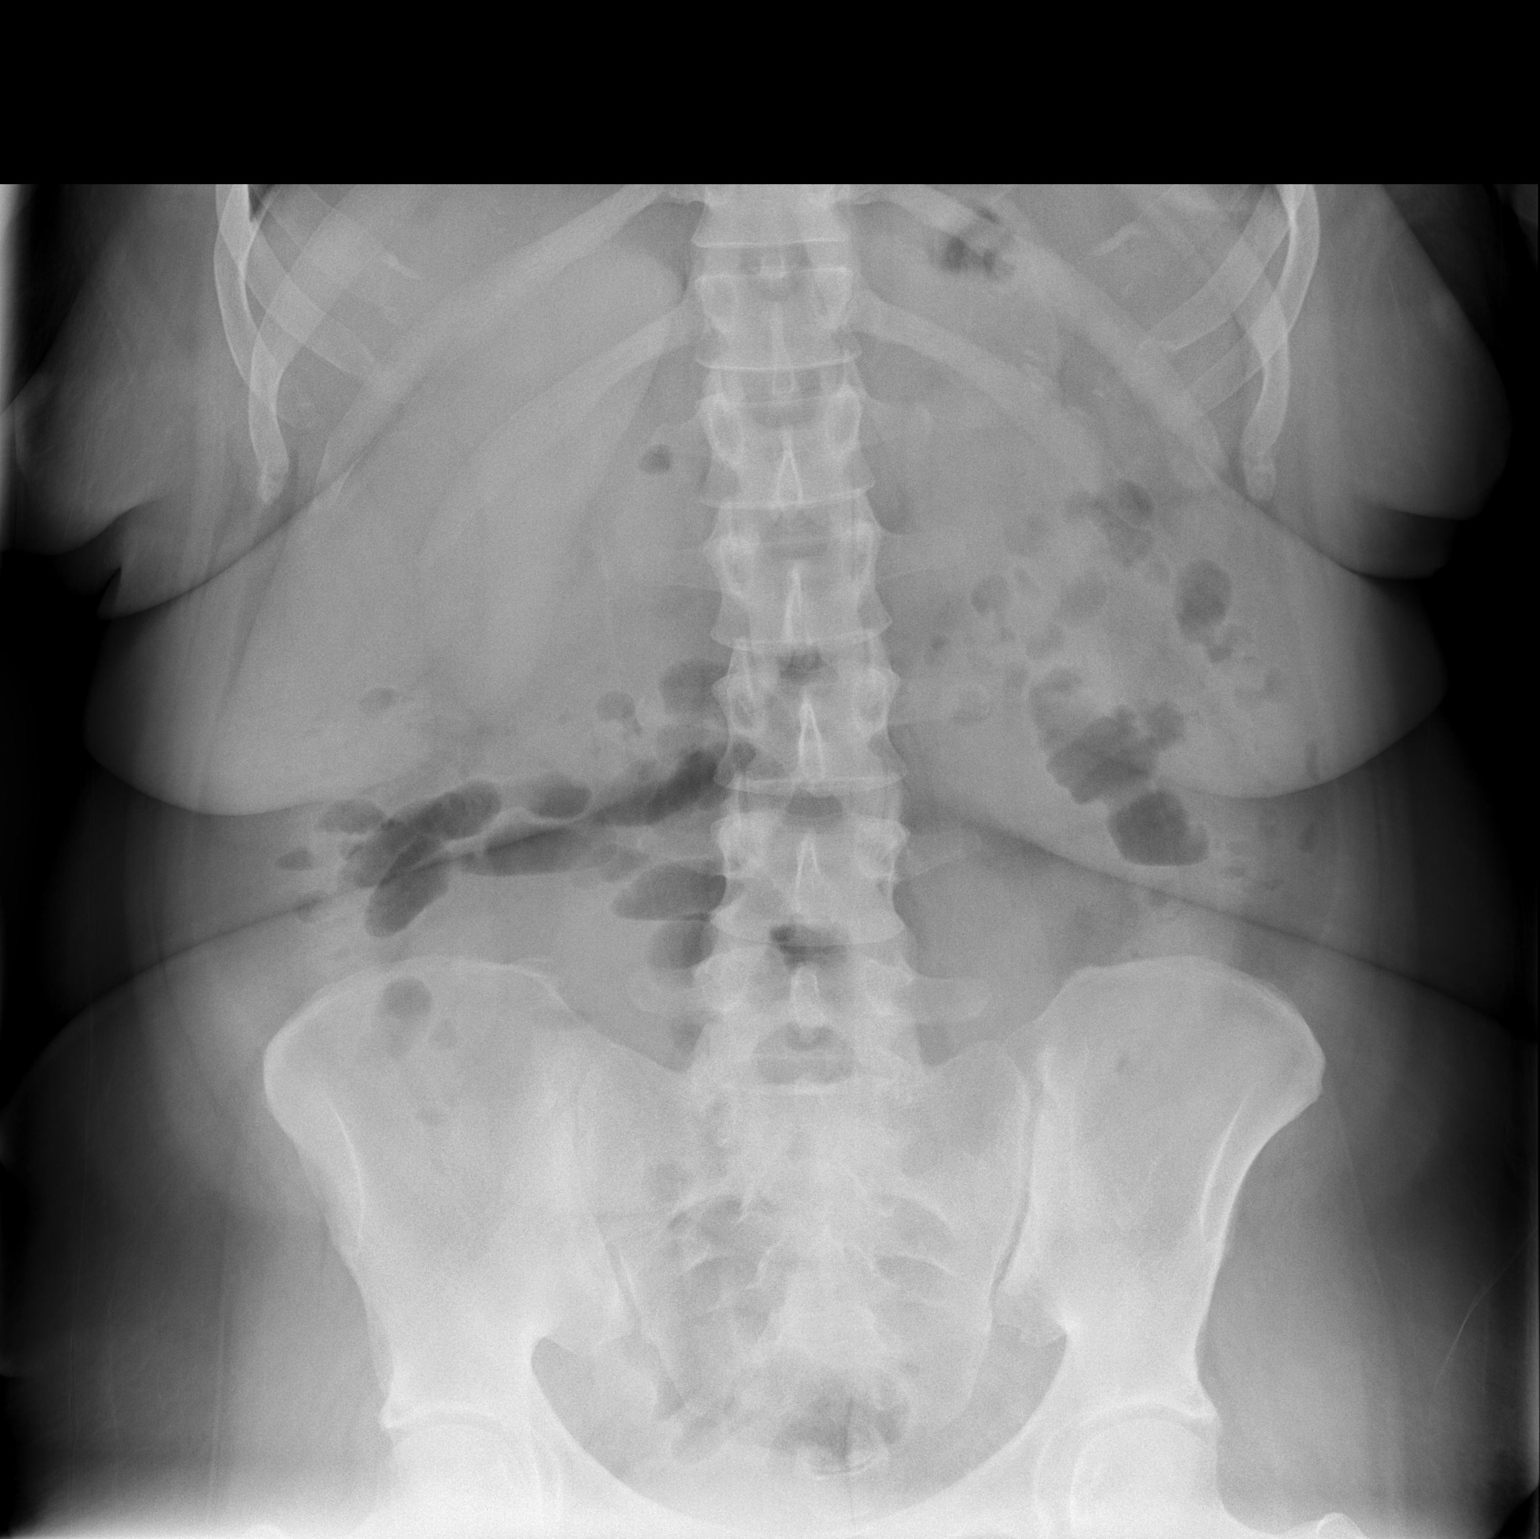

[t abdomen supine]
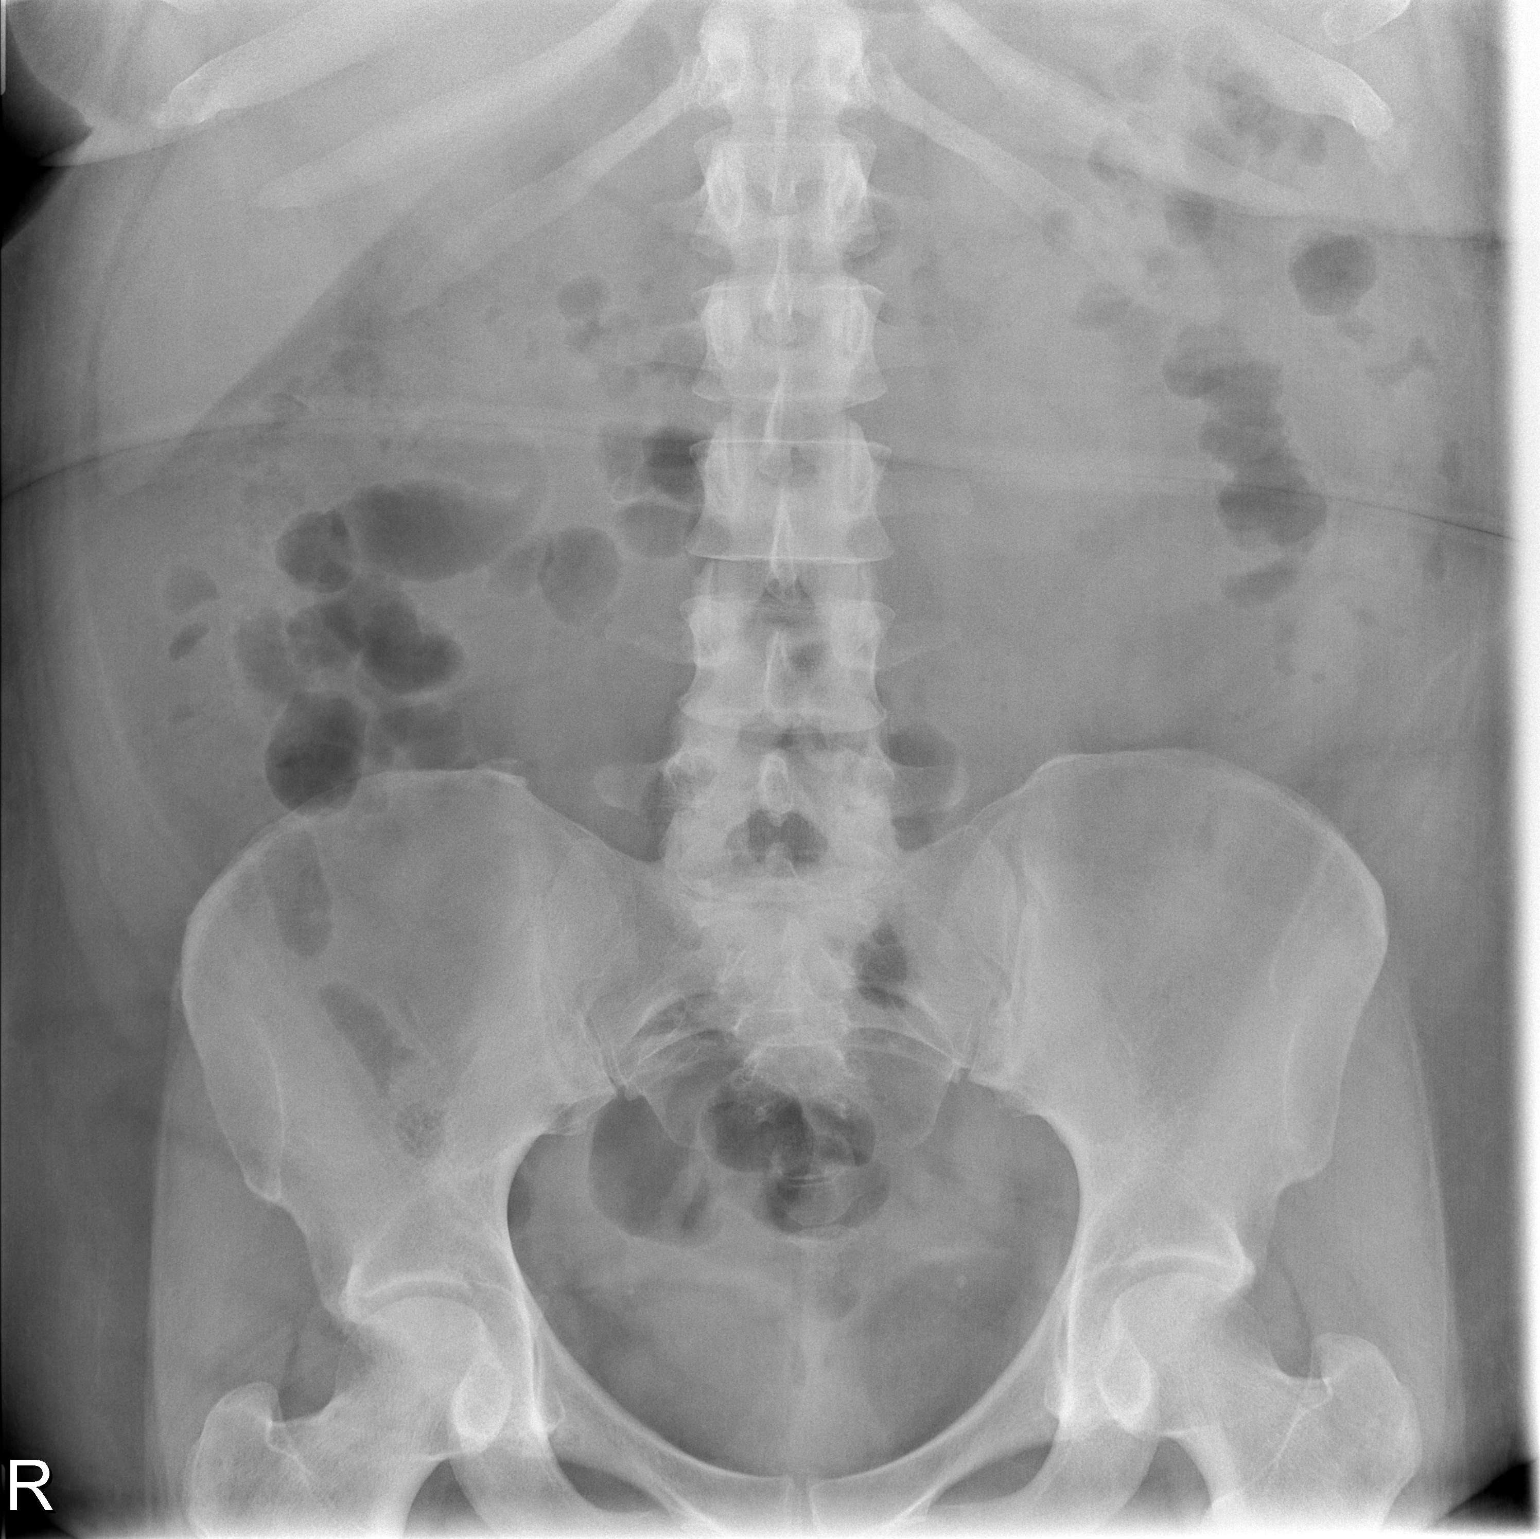

[2 of 2 positions shown; findings below may reference images not displayed]

FINDINGS: Nonobstructive bowel gas pattern.
No bowel dilatation or free intraperitoneal air.
Osseous structures unremarkable.
Small pelvic phleboliths stable.
No definite urinary tract calcification.
IMPRESSION: Nonspecific bowel gas pattern.

## 2012-09-26 NOTE — ED Provider Notes (Signed)
Medical screening examination/treatment/procedure(s) were conducted as a shared visit with non-physician practitioner(s) and myself.  I personally evaluated the patient during the encounter  Lenola Lockner T Dicie Edelen, MD 09/26/12 1530 

## 2012-10-02 ENCOUNTER — Encounter: Payer: Self-pay | Admitting: Dietician

## 2012-10-04 IMAGING — US US PELVIS COMPLETE MODIFY
1 series · 14 of 25 positions shown · non-contrast
Comparison: CT 02/17/2010

CLINICAL DATA: Left lower quadrant pelvic pain



[Series 1: us pelvis complete modify · 0.28mm/px · 14 of 50 slices shown]
[im 1/50]
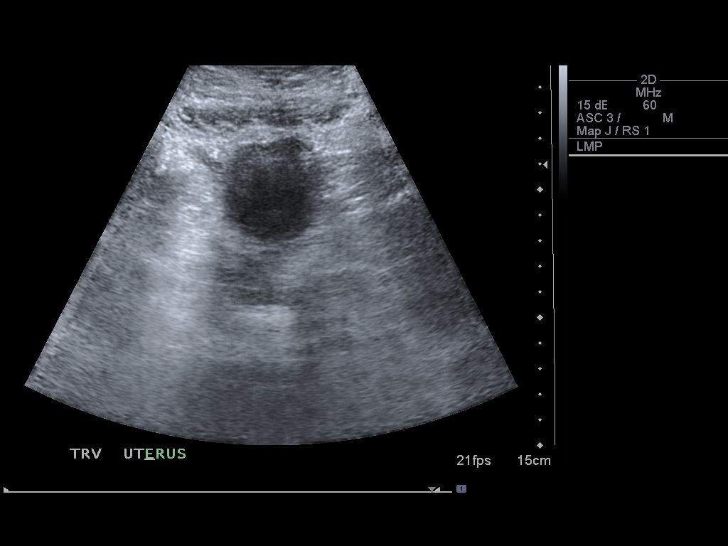
[im 5/50]
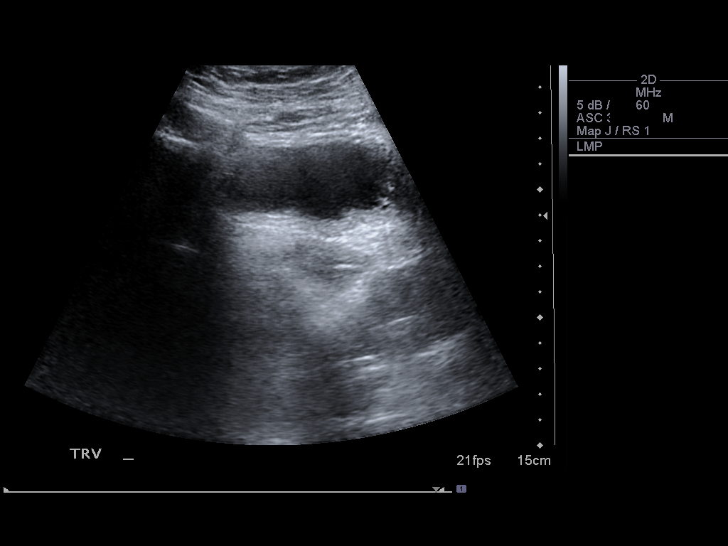
[im 9/50]
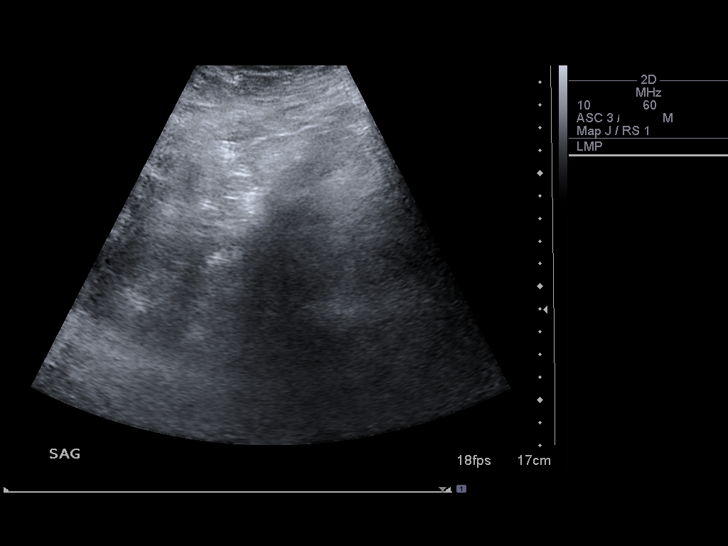
[im 13/50]
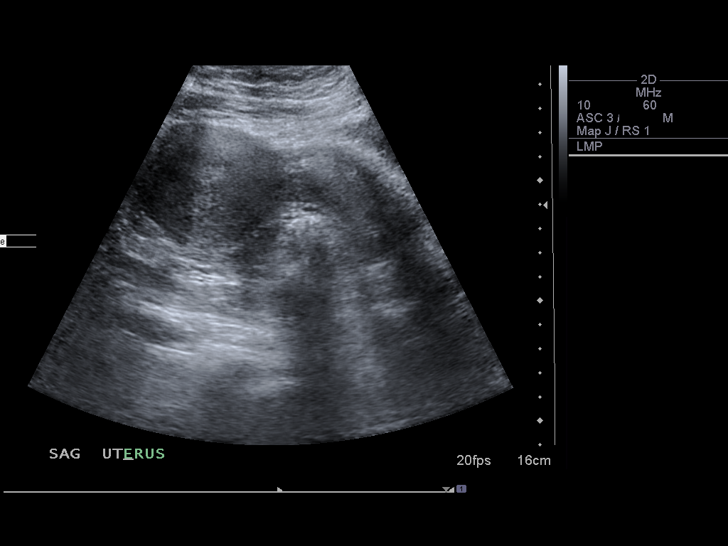
[im 17/50]
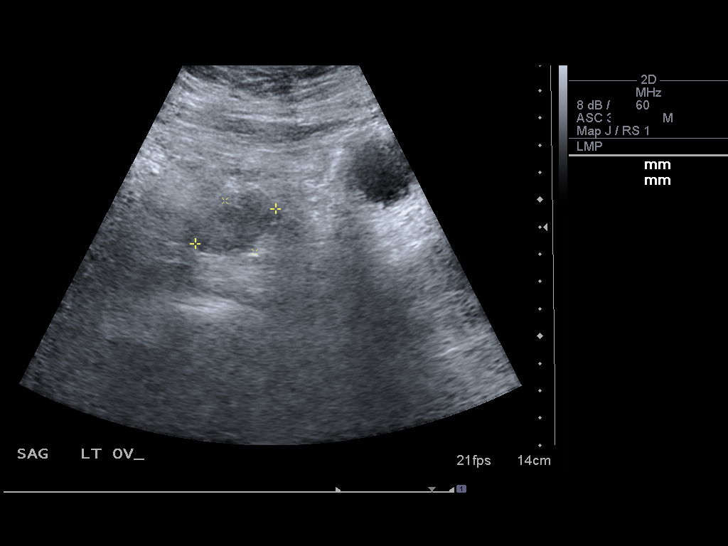
[im 19/50]
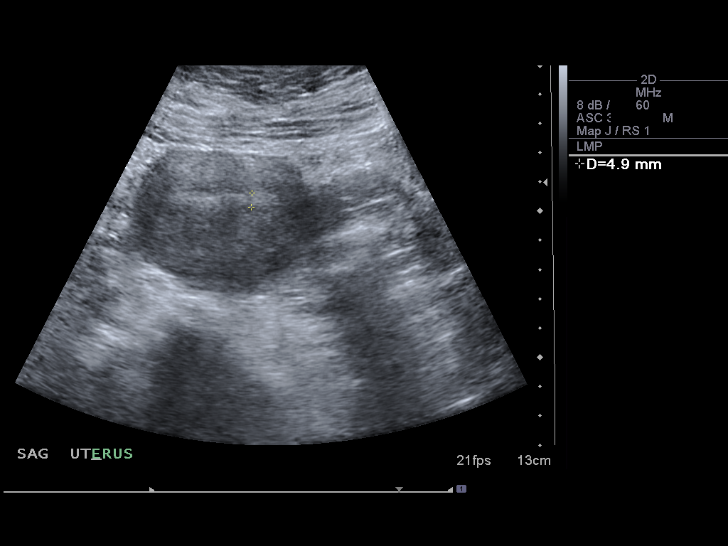
[im 23/50]
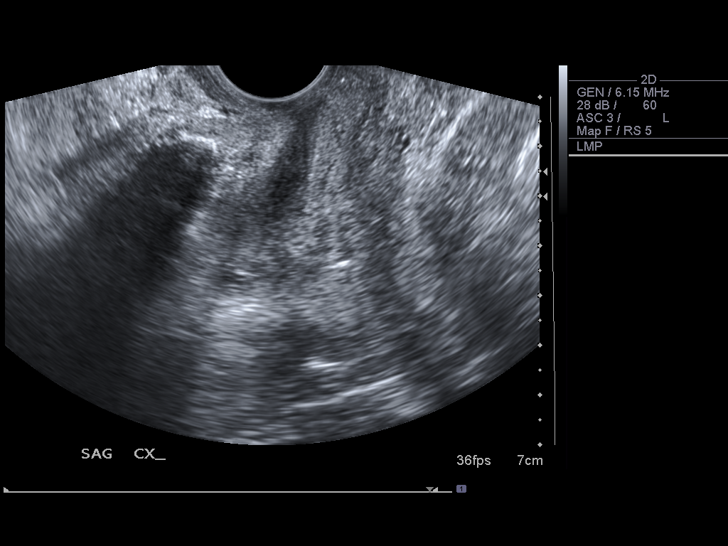
[im 27/50]
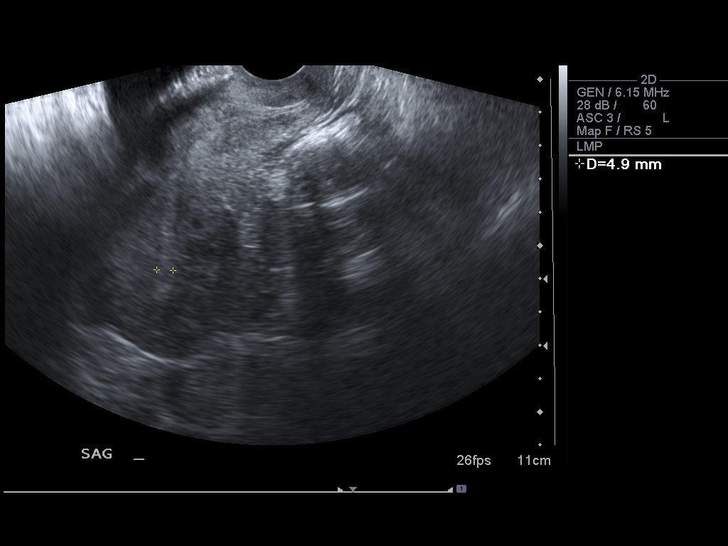
[im 31/50]
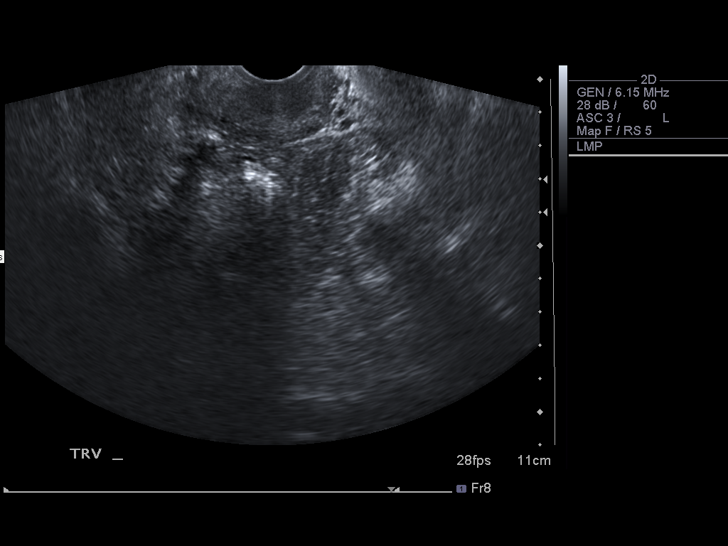
[im 33/50]
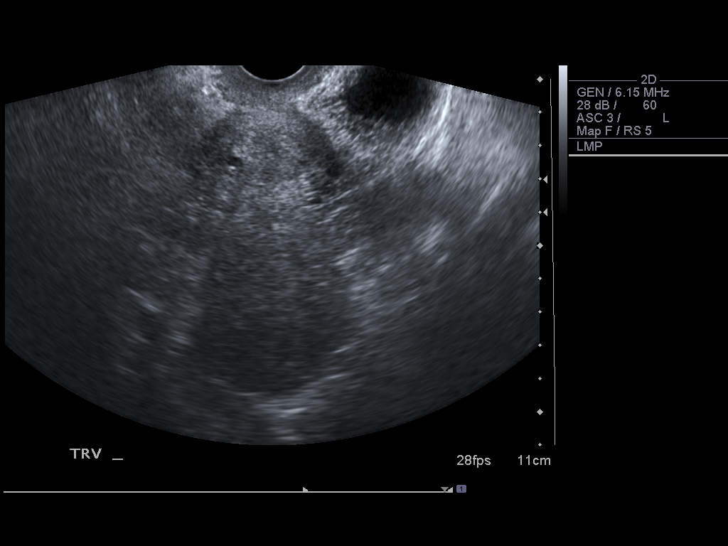
[im 37/50]
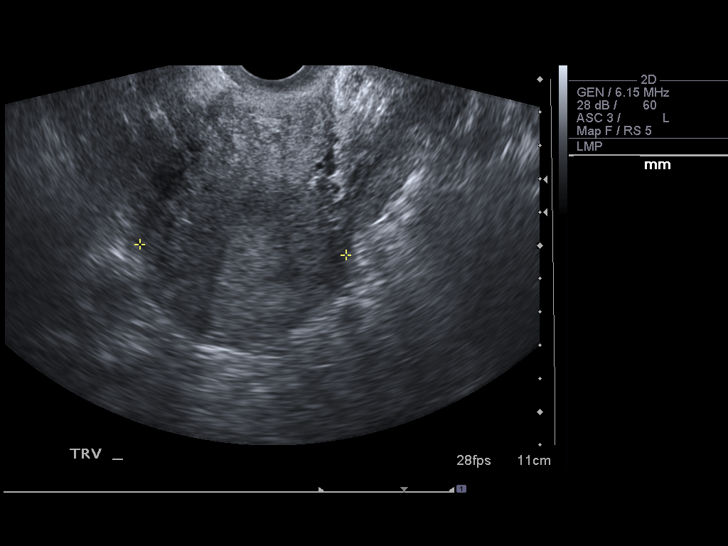
[im 41/50]
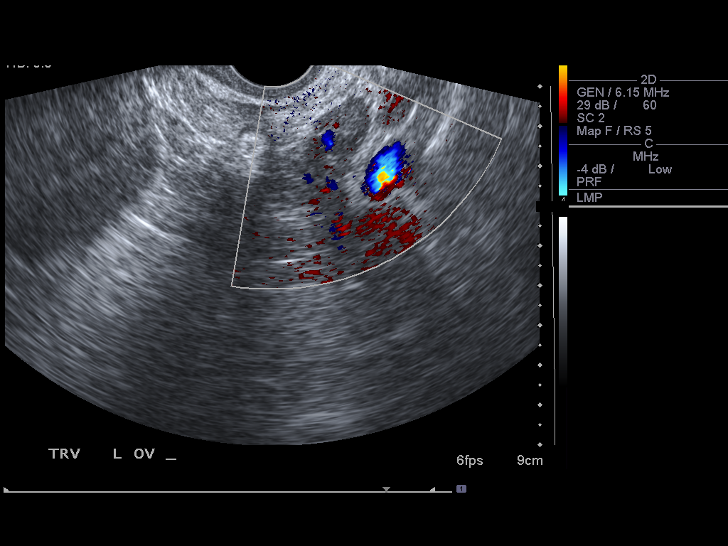
[im 45/50]
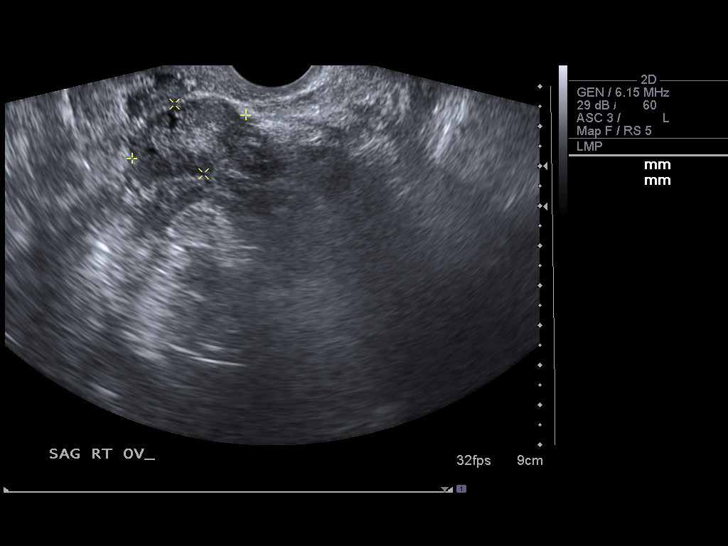
[im 50/50]
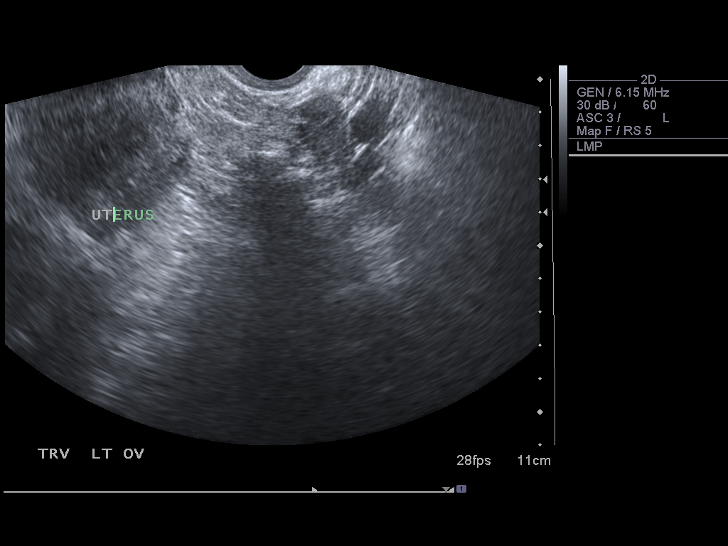

[14 of 25 positions shown; findings below may reference images not displayed]

FINDINGS: Uterus 10.1 x 5.4 x 5.4 cm.  Anteverted, slightly retroflexed, to
the right of midline.  No focal abnormality.

Endometrium 0.5 cm.  Uniformly thin and echogenic.

Right Ovary 3.1 x 3.5 x 2.2 cm.  Normal.

Left Ovary 3.2 x 8-0.2 x 1.7 cm.  Normal.

Other Findings:  No free fluid.
IMPRESSION: Normal exam.

## 2012-10-15 ENCOUNTER — Encounter (HOSPITAL_COMMUNITY): Payer: Self-pay | Admitting: Emergency Medicine

## 2012-10-15 ENCOUNTER — Emergency Department (HOSPITAL_COMMUNITY)
Admission: EM | Admit: 2012-10-15 | Discharge: 2012-10-15 | Disposition: A | Discharge status: Home / Self Care | Payer: Medicaid Other | Attending: Emergency Medicine | Admitting: Emergency Medicine

## 2012-10-15 DIAGNOSIS — Z862 Personal history of diseases of the blood and blood-forming organs and certain disorders involving the immune mechanism: Secondary | ICD-10-CM | POA: Insufficient documentation

## 2012-10-15 DIAGNOSIS — R110 Nausea: Secondary | ICD-10-CM | POA: Insufficient documentation

## 2012-10-15 DIAGNOSIS — E1165 Type 2 diabetes mellitus with hyperglycemia: Secondary | ICD-10-CM

## 2012-10-15 DIAGNOSIS — R739 Hyperglycemia, unspecified: Secondary | ICD-10-CM

## 2012-10-15 DIAGNOSIS — K219 Gastro-esophageal reflux disease without esophagitis: Secondary | ICD-10-CM | POA: Insufficient documentation

## 2012-10-15 DIAGNOSIS — J45909 Unspecified asthma, uncomplicated: Secondary | ICD-10-CM | POA: Insufficient documentation

## 2012-10-15 DIAGNOSIS — Z8659 Personal history of other mental and behavioral disorders: Secondary | ICD-10-CM | POA: Insufficient documentation

## 2012-10-15 DIAGNOSIS — Z794 Long term (current) use of insulin: Secondary | ICD-10-CM | POA: Insufficient documentation

## 2012-10-15 DIAGNOSIS — Z872 Personal history of diseases of the skin and subcutaneous tissue: Secondary | ICD-10-CM | POA: Insufficient documentation

## 2012-10-15 DIAGNOSIS — Z8673 Personal history of transient ischemic attack (TIA), and cerebral infarction without residual deficits: Secondary | ICD-10-CM | POA: Insufficient documentation

## 2012-10-15 DIAGNOSIS — G43909 Migraine, unspecified, not intractable, without status migrainosus: Secondary | ICD-10-CM | POA: Insufficient documentation

## 2012-10-15 DIAGNOSIS — Z87891 Personal history of nicotine dependence: Secondary | ICD-10-CM | POA: Insufficient documentation

## 2012-10-15 DIAGNOSIS — Z8719 Personal history of other diseases of the digestive system: Secondary | ICD-10-CM | POA: Insufficient documentation

## 2012-10-15 DIAGNOSIS — Z8619 Personal history of other infectious and parasitic diseases: Secondary | ICD-10-CM | POA: Insufficient documentation

## 2012-10-15 DIAGNOSIS — Z8679 Personal history of other diseases of the circulatory system: Secondary | ICD-10-CM | POA: Insufficient documentation

## 2012-10-15 DIAGNOSIS — Z8669 Personal history of other diseases of the nervous system and sense organs: Secondary | ICD-10-CM | POA: Insufficient documentation

## 2012-10-15 DIAGNOSIS — E1169 Type 2 diabetes mellitus with other specified complication: Secondary | ICD-10-CM | POA: Insufficient documentation

## 2012-10-15 DIAGNOSIS — Z79899 Other long term (current) drug therapy: Secondary | ICD-10-CM | POA: Insufficient documentation

## 2012-10-15 DIAGNOSIS — K3184 Gastroparesis: Secondary | ICD-10-CM | POA: Insufficient documentation

## 2012-10-15 DIAGNOSIS — I2510 Atherosclerotic heart disease of native coronary artery without angina pectoris: Secondary | ICD-10-CM | POA: Insufficient documentation

## 2012-10-15 DIAGNOSIS — Z8742 Personal history of other diseases of the female genital tract: Secondary | ICD-10-CM | POA: Insufficient documentation

## 2012-10-15 DIAGNOSIS — E669 Obesity, unspecified: Secondary | ICD-10-CM | POA: Insufficient documentation

## 2012-10-15 DIAGNOSIS — I10 Essential (primary) hypertension: Secondary | ICD-10-CM | POA: Insufficient documentation

## 2012-10-15 DIAGNOSIS — R350 Frequency of micturition: Secondary | ICD-10-CM | POA: Insufficient documentation

## 2012-10-15 DIAGNOSIS — H53149 Visual discomfort, unspecified: Secondary | ICD-10-CM | POA: Insufficient documentation

## 2012-10-15 DIAGNOSIS — Z8639 Personal history of other endocrine, nutritional and metabolic disease: Secondary | ICD-10-CM | POA: Insufficient documentation

## 2012-10-15 DIAGNOSIS — Z8739 Personal history of other diseases of the musculoskeletal system and connective tissue: Secondary | ICD-10-CM | POA: Insufficient documentation

## 2012-10-15 LAB — GLUCOSE, CAPILLARY: Glucose-Capillary: 229 mg/dL — ABNORMAL HIGH (ref 70–99)

## 2012-10-15 LAB — BASIC METABOLIC PANEL
BUN: 16 mg/dL (ref 6–23)
Chloride: 100 mEq/L (ref 96–112)
Creatinine, Ser: 0.86 mg/dL (ref 0.50–1.10)
GFR calc Af Amer: >90 mL/min (ref >90)

## 2012-10-15 MED ORDER — SODIUM CHLORIDE 0.9 % IV BOLUS (SEPSIS)
1000.0000 mL | Freq: Once | INTRAVENOUS | Status: AC
  Administered 2012-10-15: 1000 mL via INTRAVENOUS

## 2012-10-15 MED ORDER — INSULIN ASPART 100 UNIT/ML ~~LOC~~ SOLN
5.0000 [IU] | Freq: Once | SUBCUTANEOUS | Status: AC
  Administered 2012-10-15: 5 [IU] via SUBCUTANEOUS
  Filled 2012-10-15: qty 1

## 2012-10-15 MED ORDER — METOCLOPRAMIDE HCL 5 MG/ML IJ SOLN
10.0000 mg | Freq: Once | INTRAMUSCULAR | Status: AC
  Administered 2012-10-15: 10 mg via INTRAVENOUS
  Filled 2012-10-15: qty 2

## 2012-10-15 MED ORDER — KETOROLAC TROMETHAMINE 30 MG/ML IJ SOLN
30.0000 mg | Freq: Once | INTRAMUSCULAR | Status: AC
  Administered 2012-10-15: 30 mg via INTRAVENOUS
  Filled 2012-10-15: qty 1

## 2012-10-15 MED ORDER — INSULIN GLARGINE 100 UNIT/ML ~~LOC~~ SOLN: 40.0000 [IU] | Freq: Every day | SUBCUTANEOUS | Status: DC

## 2012-10-15 MED ORDER — SUMATRIPTAN SUCCINATE 6 MG/0.5ML ~~LOC~~ SOLN
6.0000 mg | Freq: Once | SUBCUTANEOUS | Status: AC
  Administered 2012-10-15: 6 mg via SUBCUTANEOUS
  Filled 2012-10-15: qty 0.5

## 2012-10-15 NOTE — ED Notes (Signed)
MD at bedside. 

## 2012-10-15 NOTE — Progress Notes (Signed)
During 10/15/12 [facility] ED Visit Cm noted pt reported having difficulty getting medications  Cm spoke with pt who confirms she is still active with medicaid coverage and she receives her medications from [facility].  Cm inquired which medication she was having difficulty affording and she reported her lantus.  CM provided pt with a list of financial assistance resources in [employer] and needymeds.org for lantus assistance Pt voiced understanding and appreciation of services offered Female significant other at the bedside reviewed materials with CM and states he would assist pt

## 2012-10-15 NOTE — ED Notes (Signed)
Patient reports a migraine that started 7 days ago. Patient c/o dizziness, nausea, and blurred vision. Patient was seen on 09/22/12 for similar problem and prescribed Vicodin. Patient states she is now out of pain medication. Patient states she did not follow up with PCP after prior visit "because it stopped after I left but then it came back".

## 2012-10-15 NOTE — ED Notes (Signed)
[id number]<AV> Expected date:10/15/12<BR> Expected time: 5:54 AM<BR> Means of arrival:Ambulance<BR> Comments:<BR> Headache, hyperglycemic

## 2012-10-15 NOTE — ED Notes (Signed)
Per EMS, Patient called 911 for headache. Patient with hx of migraines and diabetes who has not been on her medications for at least 1 month. Patient states this is different than her typical migraine that she is dizzy and nauseated. Patient having increased symptoms with light and sound. Migraine has lasted 1 week. Patient found to be hyperglycemic on examination by EMS. Patient reports swelling to bilateral feet, EMS reports there is no swelling, but that they do see discoloration.

## 2012-10-15 NOTE — ED Provider Notes (Signed)
History    CSN: [id number] Arrival date & time 10/15/12  0607  First MD Initiated Contact with Patient 10/15/12 0715     Chief Complaint  Patient presents with  . Migraine  . Hyperglycemia   (Consider location/radiation/quality/duration/timing/severity/associated sxs/prior Treatment) HPI Comments: 37 year old female with a history of diabetes which is currently untreated. She also is treated who will migraine headaches gastroparesis, chronic hepatitis B coronary disease. She presents with a headache which started approximately one week ago, has been intermittently treated the emergency department but does not take her home medications and states that she has been out of her insulin for approximately one month. Her symptoms are persistent, waxes and wanes in intensity and are associated with nausea, photophobia but no chest pain, cough, no fever or stiff neck. She states that she has been urinating frequently, drinking more fluids, is hyperglycemic.  Patient is a 37 y.o. female presenting with migraines and hyperglycemia. The history is provided by the patient, a relative and medical records.  Migraine  Hyperglycemia  Past Medical History  Diagnosis Date  . Thrombocytosis     Hem/Onc suggested 2/2 chronic hepatits and/or iron deficiency anemia  . Iron deficiency anemia   . N&V (nausea and vomiting)     Chronic. Unclear etiology with multiple admission and ED visits. CT abdomen with and without contrast (02/2011)  showed no acute process. Gastic Emptying scan (01/2010) was normal. Ultrasound of the abdomen was within normal limits. Hepatitis B viral load was undectable. HIV NR. EGD - gastritis, Hpylori + s/p Rx  . Abdominal pain   . Hypertension   . Hyperlipidemia   . Diabetes mellitus type 2, uncontrolled, with complications   . Recurrent boils   . Back pain   . OSA (obstructive sleep apnea)   . CAD (coronary artery disease) 06/15/2006    s/p Subendocardial MI with PDA  angioplasty(no stent) on 06/15/06 and relook  cath 06/19/06 showed patency of site. Cath 12/10- no restenosis or significant CAD progression  . Irregular menses     Small ovarian follicles seen on CT(9/06)  . History of pyelonephritis     H/o GrpB Pyelonephritis (9/06) and UTI- 07/11- E.Coli, 12/10- GBS  . Abscess of tunica vaginalis     10/09- Abundant S. aureus- sensitive to all abx  . Obesity   . Asthma   . Gastritis   . Depression   . Hepatitis B, chronic     Hep BeAb+,Hep B cAb+ & Hep BsAg+ (9/06)  . Peripheral neuropathy   . Fibromyalgia   . GERD (gastroesophageal reflux disease)   . Migraine   . Anxiety   . Gastroparesis     secondary to poorly controlled DM, last emptying study performed 01/2010  was normal but may be falsely positive as pt was on reglan  . Blood dyscrasia   . H/O: CVA (cerebrovascular accident)     history of remote right cerebellar infarct noted on head CT at least since 10/2011   Past Surgical History  Procedure Laterality Date  . Cesarean section  1997   Family History  Problem Relation Age of Onset  . Diabetes Father    History  Substance Use Topics  . Smoking status: Former Smoker    Types: Cigarettes    Quit date: 04/24/1996  . Smokeless tobacco: Never Used     Comment: quit smoking cigarettes age 1  . Alcohol Use: Yes     Comment: occasionally   OB History   Grav Para Term  Preterm Abortions TAB SAB Ect Mult Living                 Review of Systems  All other systems reviewed and are negative.    Allergies  Lisinopril  Home Medications   Current Outpatient Rx  Name  Route  Sig  Dispense  Refill  . acetaminophen (TYLENOL) 500 MG tablet   Oral   Take 1,000 mg by mouth every 6 (six) hours as needed for pain.         Marland Kitchen albuterol (PROVENTIL HFA;VENTOLIN HFA) 108 (90 BASE) MCG/ACT inhaler   Inhalation   Inhale 2 puffs into the lungs every 4 (four) hours as needed. For shortness of breath   1 Inhaler   6   .  Fluticasone-Salmeterol (ADVAIR) 100-50 MCG/DOSE AEPB   Inhalation   Inhale 1 puff into the lungs every 12 (twelve) hours.   60 each   6   . HYDROcodone-acetaminophen (NORCO/VICODIN) 5-325 MG per tablet      Take 1-2 tabs every 6 hours as needed for pain.   6 tablet   0   . labetalol (NORMODYNE) 100 MG tablet   Oral   Take 0.5 tablets (50 mg total) by mouth 2 (two) times daily.   60 tablet   0   . omeprazole (PRILOSEC) 40 MG capsule   Oral   Take 1 capsule (40 mg total) by mouth daily.   31 capsule   6   . pregabalin (LYRICA) 75 MG capsule   Oral   Take 75 mg by mouth daily.         . promethazine (PHENERGAN) 12.5 MG tablet   Oral   Take 2 tablets (25 mg total) by mouth every 6 (six) hours as needed for nausea.   30 tablet   0   . promethazine (PHENERGAN) 25 MG suppository   Rectal   Place 1 suppository (25 mg total) rectally every 6 (six) hours as needed for nausea.   12 each   0   . traMADol (ULTRAM) 50 MG tablet   Oral   Take 1-2 tablets (50-100 mg total) by mouth every 6 (six) hours as needed for pain.   60 tablet   0   . insulin glargine (LANTUS) 100 UNIT/ML injection   Subcutaneous   Inject 0.4 mLs (40 Units total) into the skin at bedtime.   10 mL   3     Lot #  [id number] Exp. Date 8/205  Patient has been  ...    BP 136/89  Pulse 94  Resp 18  SpO2 95%  LMP 10/15/2012 Physical Exam  Nursing note and vitals reviewed. Constitutional: She appears well-developed and well-nourished.  Uncomfortable appearing  HENT:  Head: Normocephalic and atraumatic.  Mouth/Throat: Oropharynx is clear and moist. No oropharyngeal exudate.  Eyes: Conjunctivae and EOM are normal. Pupils are equal, round, and reactive to light. Right eye exhibits no discharge. Left eye exhibits no discharge. No scleral icterus.  Neck: Normal range of motion. Neck supple. No JVD present. No thyromegaly present.  Cardiovascular: Normal rate, regular rhythm, normal heart sounds and  intact distal pulses.  Exam reveals no gallop and no friction rub.   No murmur heard. Pulmonary/Chest: Effort normal and breath sounds normal. No respiratory distress. She has no wheezes. She has no rales.  Abdominal: Soft. Bowel sounds are normal. She exhibits no distension and no mass. There is no tenderness.  Musculoskeletal: Normal range of motion. She exhibits no edema  and no tenderness.  Lymphadenopathy:    She has no cervical adenopathy.  Neurological: She is alert. Coordination normal.  Speech is clear, movements or coordinated, no ataxia, normal strength and sensation, cranial nerves III through XII are intact.  Skin: Skin is warm and dry. No rash noted. No erythema.  Psychiatric: She has a normal mood and affect. Her behavior is normal.    ED Course  Procedures (including critical care time) Labs Reviewed  GLUCOSE, CAPILLARY - Abnormal; Notable for the following:    Glucose-Capillary 418 (*)    All other components within normal limits  BASIC METABOLIC PANEL - Abnormal; Notable for the following:    Sodium 131 (*)    Glucose, Bld 422 (*)    GFR calc non Af Amer 84 (*)    All other components within normal limits  GLUCOSE, CAPILLARY - Abnormal; Notable for the following:    Glucose-Capillary 300 (*)    All other components within normal limits  GLUCOSE, CAPILLARY - Abnormal; Notable for the following:    Glucose-Capillary 229 (*)    All other components within normal limits   No results found. 1. Headache   2. Hyperglycemia   3. Poorly controlled type 2 diabetes mellitus     MDM  Overall the patient is hyperglycemic, her exam otherwise is unremarkable, her vital signs are significant for mild hypertension but no tachycardia, no fevers, no tachypnea no hypoxia. We'll start with IV fluid resuscitation for her hyperglycemia, antimigraine medications and check a basic metabolic panel for an anion gap that I suspect this is unlikely. She will need refills on her  medications.  No anion gap on BMP,  Glucose > 400.  IVF given, pain meds given.  CBG < 250, pt stable, meds refillied for lantus.  Meds given in ED:  Medications  sodium chloride 0.9 % bolus 1,000 mL (0 mLs Intravenous Stopped 10/15/12 0900)  sodium chloride 0.9 % bolus 1,000 mL (0 mLs Intravenous Stopped 10/15/12 1000)  ketorolac (TORADOL) 30 MG/ML injection 30 mg (30 mg Intravenous Given 10/15/12 0758)  metoCLOPramide (REGLAN) injection 10 mg (10 mg Intravenous Given 10/15/12 0758)  SUMAtriptan (IMITREX) injection 6 mg (6 mg Subcutaneous Given 10/15/12 1035)  insulin aspart (novoLOG) injection 5 Units (5 Units Subcutaneous Given 10/15/12 1034)    New Prescriptions   No medications on file      Vida Roller, MD 10/15/12 1304

## 2012-10-17 ENCOUNTER — Telehealth (HOSPITAL_COMMUNITY): Payer: Self-pay | Admitting: *Deleted

## 2012-10-17 ENCOUNTER — Other Ambulatory Visit: Payer: Self-pay | Admitting: Internal Medicine

## 2012-10-17 ENCOUNTER — Telehealth: Payer: Self-pay | Admitting: *Deleted

## 2012-10-17 NOTE — Telephone Encounter (Signed)
[facility] [address] calls and states pt uses solostar and script from 05/2012 needs to be changed, as i look at chart pt was seen in ED 6/24 and insulin dose changed, pharm is ask to call [place] ED for new insulin order and appt is made for pt with dr schooler.

## 2012-10-24 IMAGING — CR DG ABDOMEN ACUTE W/ 1V CHEST
4 series · 4 of 4 positions shown · non-contrast
Comparison: None.

CLINICAL DATA: Nausea, vomiting and abdominal pain.

ACUTE ABDOMEN SERIES (ABDOMEN 2 VIEW & CHEST 1 VIEW)

[w chest pa *]
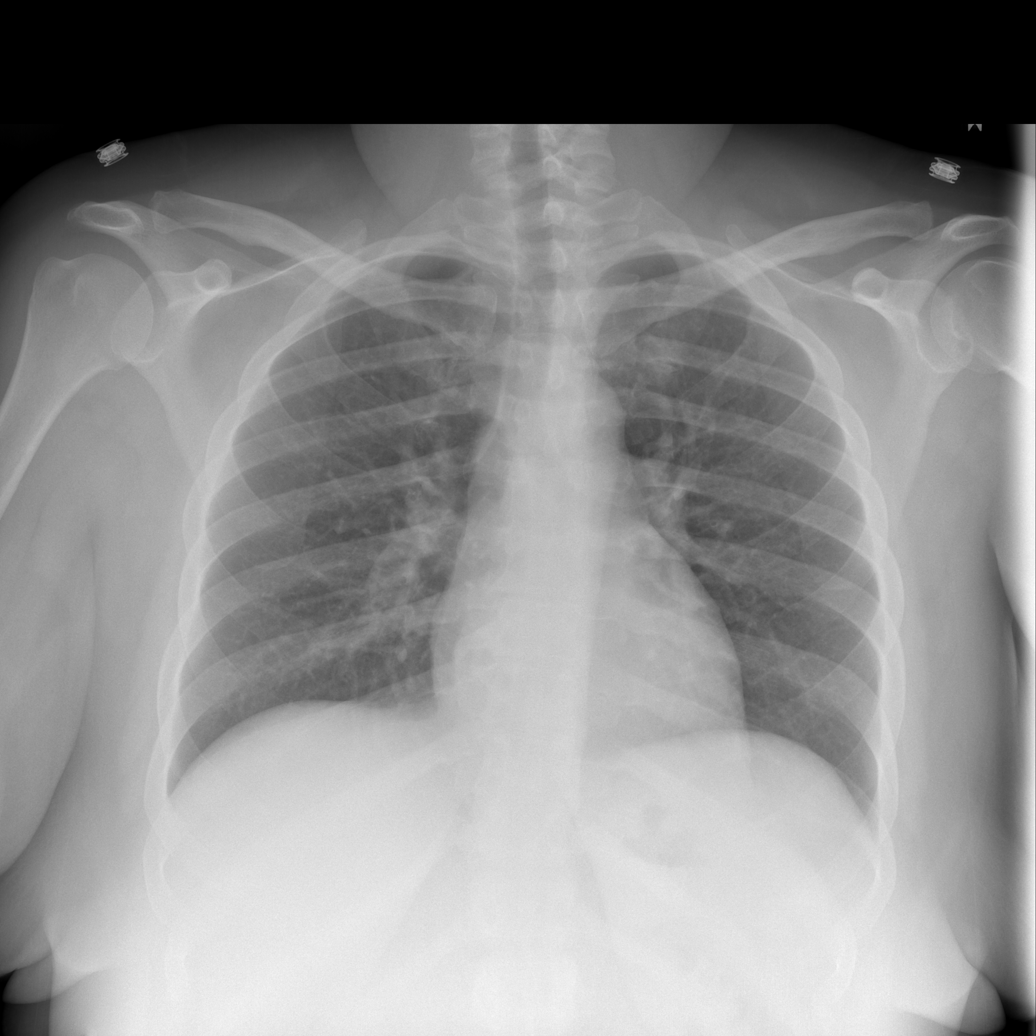

[w abdomen upright *]
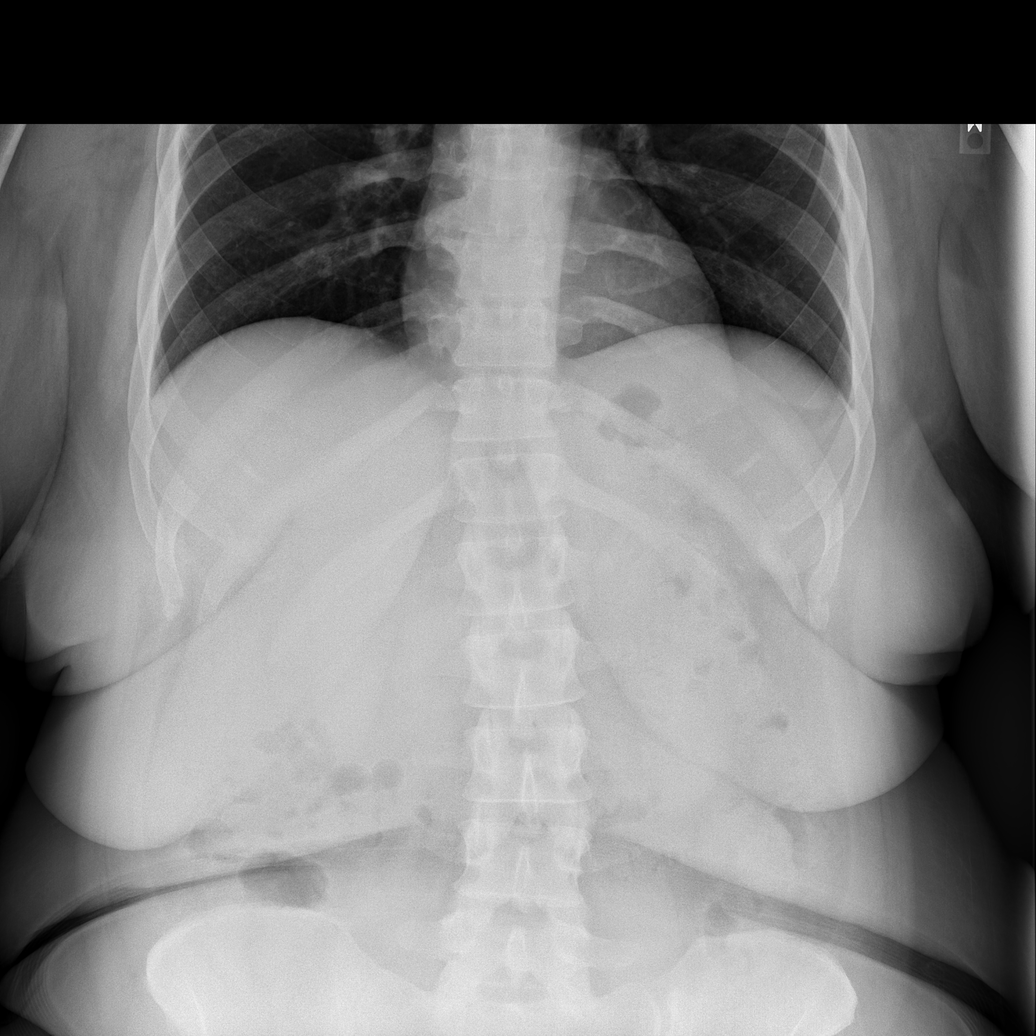

[t abdomen supine (1 of 2)]
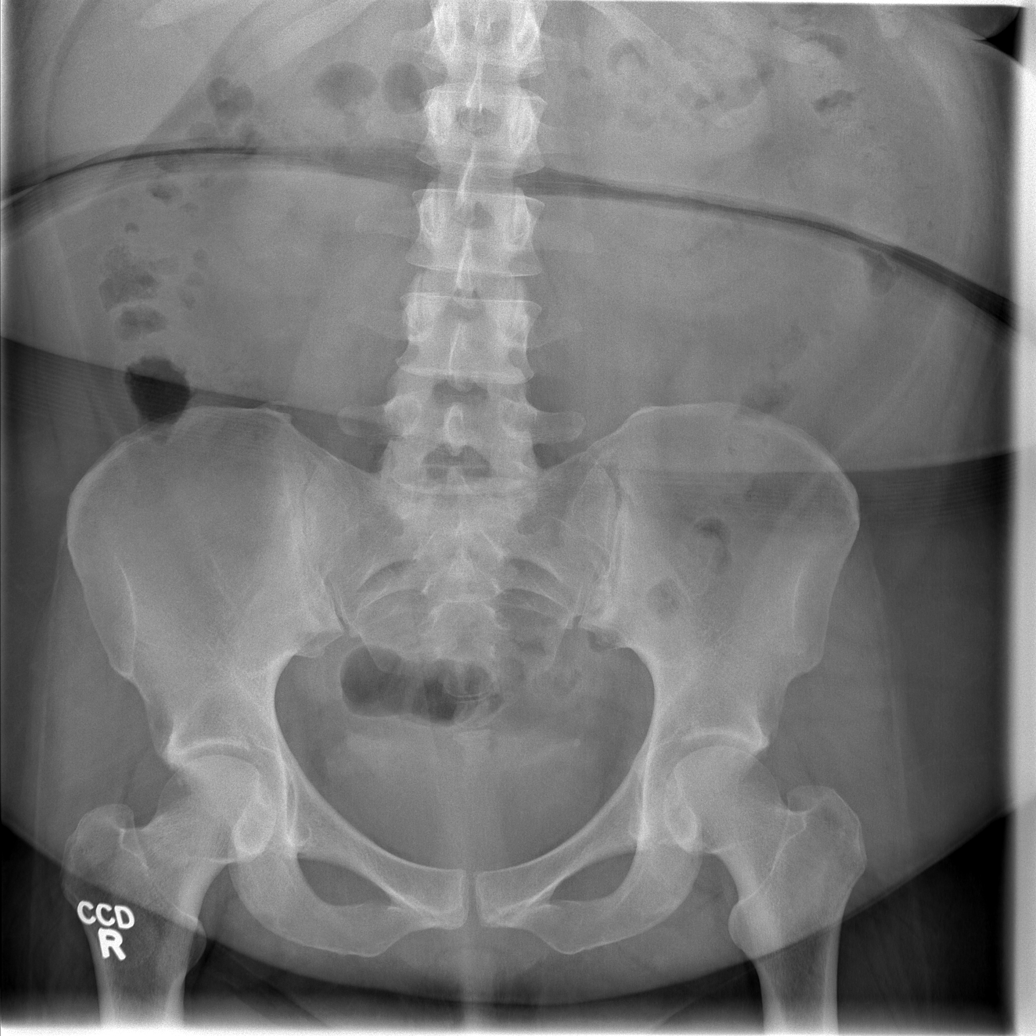

[t abdomen supine (2 of 2)]
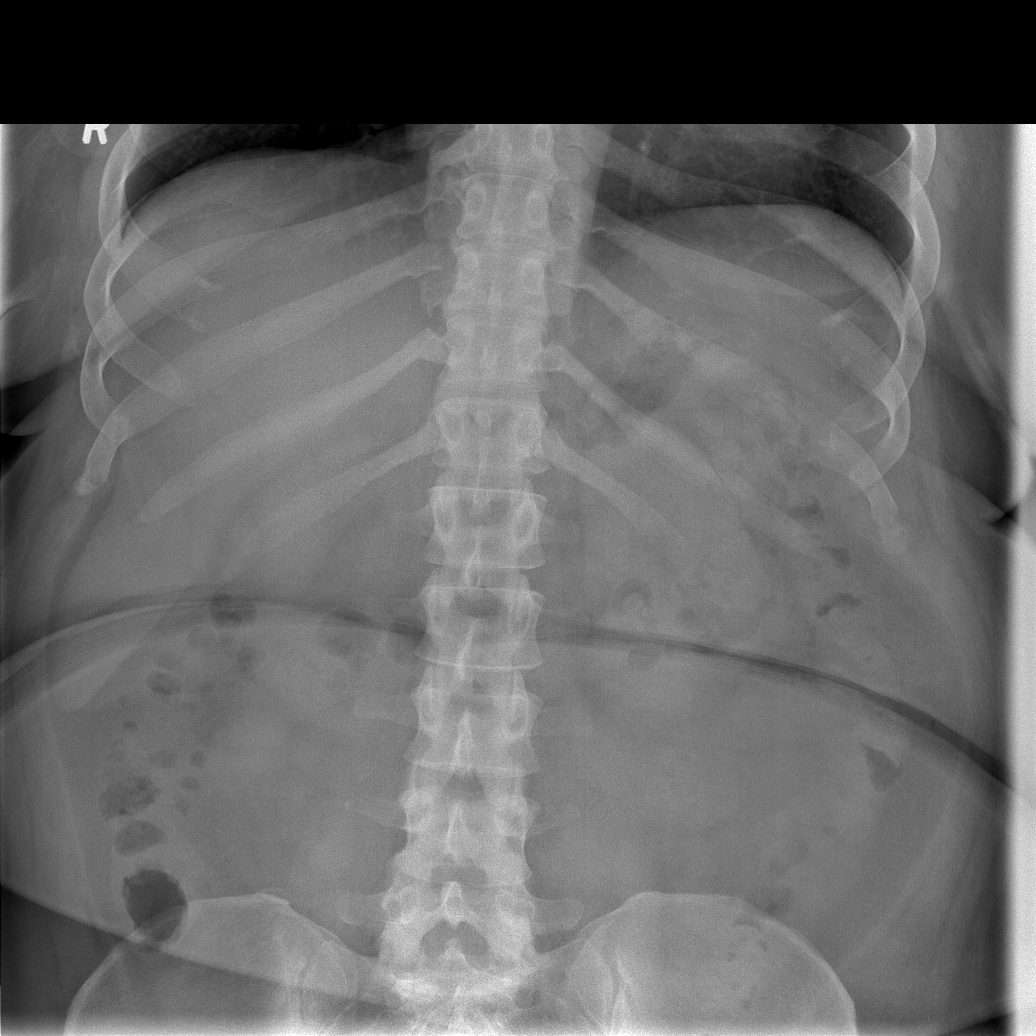

[4 of 4 positions shown; findings below may reference images not displayed]

FINDINGS: The lungs are clear.  The heart is normal in size.  No
intra-abdominal free air is seen.  There are no dilated small bowel
loops in the abdomen.  Gas and stool seen throughout the colon
without evidence of constipation.  No unexpected calcifications are
seen.  The bones are normal.
IMPRESSION: No acute findings.

## 2012-10-31 ENCOUNTER — Other Ambulatory Visit: Payer: Self-pay

## 2012-11-04 ENCOUNTER — Ambulatory Visit (INDEPENDENT_AMBULATORY_CARE_PROVIDER_SITE_OTHER): Payer: Medicaid Other | Admitting: Internal Medicine

## 2012-11-04 ENCOUNTER — Encounter: Payer: Self-pay | Admitting: Internal Medicine

## 2012-11-04 VITALS — BP 124/89 | HR 95 | Temp 98.0°F | Ht 67.0 in | Wt 233.2 lb

## 2012-11-04 DIAGNOSIS — G609 Hereditary and idiopathic neuropathy, unspecified: Secondary | ICD-10-CM

## 2012-11-04 DIAGNOSIS — I10 Essential (primary) hypertension: Secondary | ICD-10-CM

## 2012-11-04 DIAGNOSIS — L732 Hidradenitis suppurativa: Secondary | ICD-10-CM

## 2012-11-04 DIAGNOSIS — R112 Nausea with vomiting, unspecified: Secondary | ICD-10-CM

## 2012-11-04 DIAGNOSIS — E119 Type 2 diabetes mellitus without complications: Secondary | ICD-10-CM

## 2012-11-04 DIAGNOSIS — E1165 Type 2 diabetes mellitus with hyperglycemia: Secondary | ICD-10-CM

## 2012-11-04 LAB — GLUCOSE, CAPILLARY: Glucose-Capillary: 223 mg/dL — ABNORMAL HIGH (ref 70–99)

## 2012-11-04 MED ORDER — PREGABALIN 75 MG PO CAPS: 75.0000 mg | ORAL_CAPSULE | Freq: Every day | ORAL | Status: DC

## 2012-11-04 MED ORDER — GLUCOSE BLOOD VI STRP: ORAL_STRIP | Status: DC

## 2012-11-04 MED ORDER — INSULIN ASPART 100 UNIT/ML FLEXPEN: SUBCUTANEOUS | Status: DC

## 2012-11-04 MED ORDER — PREGABALIN 75 MG PO CAPS: 75.0000 mg | ORAL_CAPSULE | Freq: Two times a day (BID) | ORAL | Status: DC

## 2012-11-04 MED ORDER — INSULIN GLARGINE 100 UNIT/ML SOLOSTAR PEN: PEN_INJECTOR | SUBCUTANEOUS | Status: DC

## 2012-11-04 MED ORDER — HYDROCODONE-ACETAMINOPHEN 7.5-325 MG PO TABS: 1.0000 | ORAL_TABLET | Freq: Four times a day (QID) | ORAL | Status: DC | PRN

## 2012-11-04 NOTE — Assessment & Plan Note (Signed)
Will need to f/u with Dr. Elnoria Howard for planned RGD and colonoscopy

## 2012-11-04 NOTE — Assessment & Plan Note (Signed)
BP Readings from Last 3 Encounters:  11/04/12 124/89  10/15/12 142/96  09/22/12 157/102    Lab Results  Component Value Date   NA 131* 10/15/2012   K 4.2 10/15/2012   CREATININE 0.86 10/15/2012    Assessment: Blood pressure control: controlled Progress toward BP goal:  at goal Comments:   Plan: Medications:  continue current medications Educational resources provided:   Self management tools provided:   Other plans: cont labetolol 100 mg bid

## 2012-11-04 NOTE — Assessment & Plan Note (Signed)
Lab Results  Component Value Date   HGBA1C 12.3 11/04/2012   HGBA1C >14.0 07/15/2012   HGBA1C >14.0 01/22/2012     Assessment: Diabetes control: poor control (HgbA1C >9%) Progress toward A1C goal:  improved Comments: pt requesting to go back on meal time insulin with Lantus, HgbA1c has inproved from >14 at last visit since becoming more compliant  Plan: Medications:  continue current medications Home glucose monitoring: Frequency: 3 times a day Timing:   Instruction/counseling given: reminded to get eye exam, reminded to bring blood glucose meter & log to each visit and reminded to bring medications to each visit Educational resources provided:   Self management tools provided:   Other plans: will add Novolog sliding scale to Lantus 40 units, qhs

## 2012-11-04 NOTE — Assessment & Plan Note (Signed)
Trying to keep area dry.  Boil lanced in ED several months ago.  Difficult to be evaluated by dermatology given Medicaid status.

## 2012-11-04 NOTE — Patient Instructions (Addendum)
General Instructions:  Keep your appointment with DR. Hung of GI for further evaluation of your nausea and vomiting. We have refilled your Lyrica and increased it to twice a day and given you a short supply of pain medicine until you see Dr. Elnoria Howard.  This will not be refilled. We will draft a letter for you to present to the housing office concerning your need to be closer to family and in a first floor apartment. This will not be ready until 5- 7 days.  For the meal time insulin, check your blood sugar and inject: BG 150-199: 2 unit bolus  BG 200-249: 4 units bolus  BG 250-299: 7 units bolus  BG 300-349: 10 units bolus       BG Over 350: 12 units bolus   Treatment Goals:  Goals (1 Years of Data) as of 11/04/12         As of Today 10/15/12 10/15/12 09/22/12 09/22/12     Blood Pressure    . Blood Pressure < 140/90  124/89 142/96 136/89 157/102 149/91     Result Component    . HEMOGLOBIN A1C < 7.0  12.3        . LDL CALC < 100            Progress Toward Treatment Goals:  Treatment Goal 11/04/2012  Hemoglobin A1C improved  Blood pressure at goal    Self Care Goals & Plans:  Self Care Goal 11/04/2012  Manage my medications take my medicines as prescribed  Monitor my health keep track of my blood glucose; bring my glucose meter and log to each visit  Eat healthy foods -    Home Blood Glucose Monitoring 11/04/2012  Check my blood sugar 3 times a day     Care Management & Community Referrals:

## 2012-11-04 NOTE — Assessment & Plan Note (Signed)
Will refill Lyrica, pt still with poor DM control

## 2012-11-05 NOTE — Progress Notes (Addendum)
  Subjective:    Patient ID: Shirley [name], female    DOB: October 20, 1975, 37 y.o.   MRN: [id number]  HPI  Pt is a 37 yo female with poorly controlled Diabetes mellitus, morbid obesity, CAD s/p PTCA, gastroparesis with chronic nausea and vomiting who presents for ED follow-up.  She is requesting to resume meal time insulin coverage,  Has not followed up with Dr. Elnoria Howard of GI.  Reports cont nausea and vomiting and only eating jello and ice cream. Se states that she does not have mouch family support now tat her children have moved from the home and is requesting a letter for her landlord at [place] which should state that having family support would benefit her medically.  Review of Systems  Constitutional: Positive for fatigue. Negative for fever.  HENT: Negative for congestion, sore throat and trouble swallowing.   Respiratory: Negative for chest tightness and shortness of breath.   Cardiovascular: Negative for chest pain and palpitations.  Gastrointestinal: Positive for nausea, vomiting and abdominal pain. Negative for diarrhea, constipation and blood in stool.  Genitourinary: Negative for hematuria.  Skin: Negative for rash.  Neurological: Negative for weakness, light-headedness and headaches.       Objective:   Physical Exam  Constitutional: She is oriented to person, place, and time. She appears well-developed and well-nourished.  HENT:  Head: Normocephalic and atraumatic.  Eyes: Conjunctivae and EOM are normal. Pupils are equal, round, and reactive to light.  Neck: Normal range of motion. Neck supple. No thyromegaly present.  Cardiovascular: Normal rate, regular rhythm and normal heart sounds.   Pulmonary/Chest: Effort normal and breath sounds normal.  Abdominal: Soft. Bowel sounds are normal. She exhibits no distension. There is no tenderness. There is no rebound.  obese  Musculoskeletal: Normal range of motion. She exhibits no edema.  Neurological: She is alert and  oriented to person, place, and time.  Skin: Skin is warm and dry.  Psychiatric: She has a normal mood and affect. Her behavior is normal. Judgment and thought content normal.          Assessment & Plan:  1. Diabetic Gastroparesis: with chronic nausea, vomiting and abdominal pain, pt reports compliance with reglan and other medications -will schedule f/u with Dr. Elnoria Howard of GI for planned EGD and Colonoscopy -cont anti-nausea meds and reglan -short course hydrocodone given 7.5-325 q6h # 60 tabs NO refills, discussed decreased benefit of opioids -pain contract reviewed and signed  2. DM, poorly control: cont Lantus 40 U qhs, will add Novolog meal time coverage -sliding scale instructions given to patient -refilled strips -needs Eye Exam as she missed last appt  3. DM peripheral neuropathy: cont Lyrica  4. Fibromyalgia: Refilled and increased Lyrica to 75 mg bis from qd.   -will likely need further upward titration  3. Dispo: letter written for landlord in [employer] and routed to be printed for pt

## 2012-11-10 IMAGING — CR DG ABDOMEN 1V
1 series · 1 of 1 positions shown · non-contrast
Comparison: 04/06/2010

CLINICAL DATA: Nausea and vomiting.  Diarrhea.

ABDOMEN - 1 VIEW

[w abdomen upright]
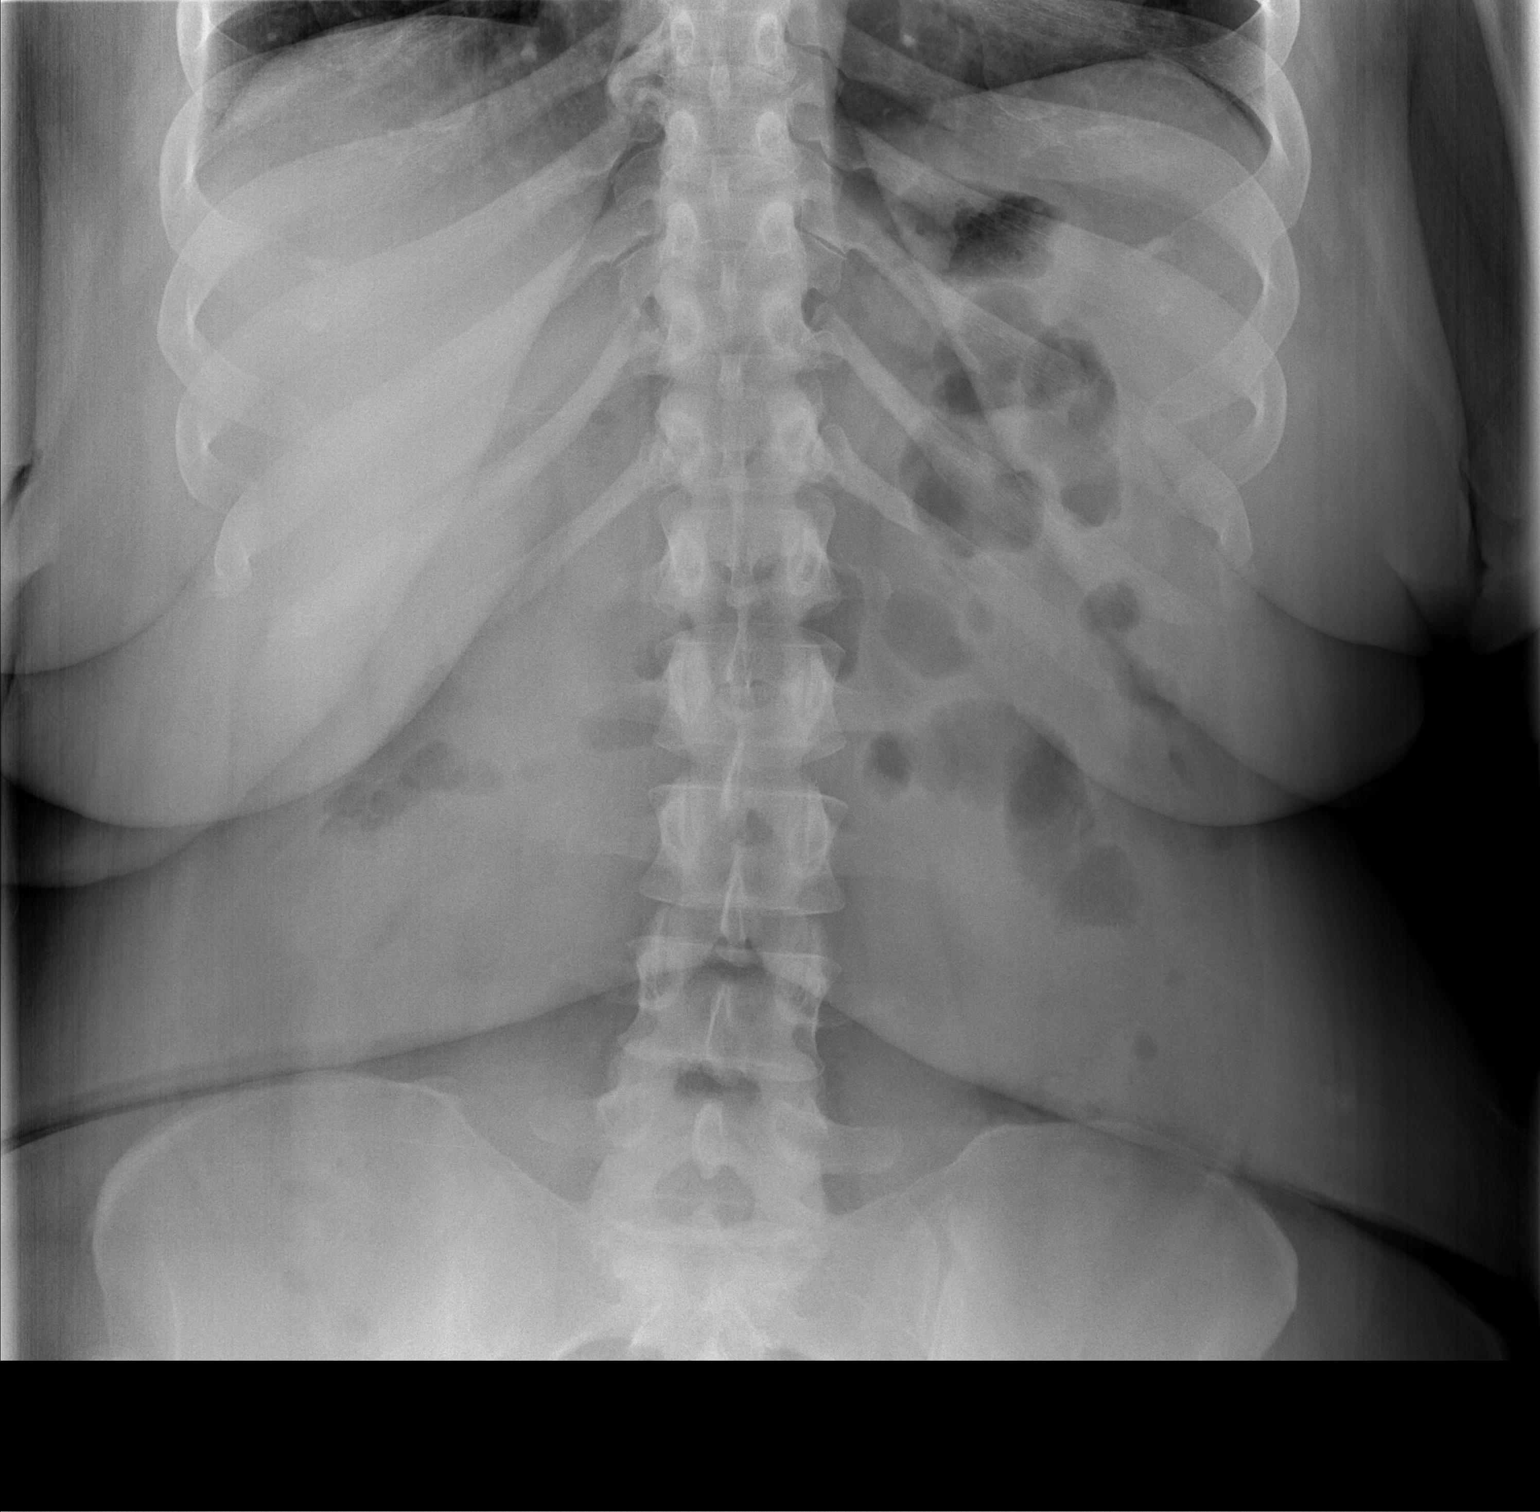

[1 of 1 positions shown; findings below may reference images not displayed]

FINDINGS: There is no free air in the abdomen.  The bowel gas
pattern is normal with only a small amount of air in the
nondistended large and small bowel.  No abnormal abdominal
calcifications.  Osseous structures are normal.
IMPRESSION: Benign-appearing abdomen.

## 2012-11-18 NOTE — Progress Notes (Signed)
Case discussed with Dr. Bosie Clos (at time of visit, soon after the resident saw the patient).  We reviewed the resident's history and exam and pertinent patient test results.  I agree with the assessment, diagnosis, and plan of care documented in the resident's note.

## 2012-12-27 ENCOUNTER — Telehealth: Payer: Self-pay | Admitting: *Deleted

## 2012-12-27 NOTE — Telephone Encounter (Signed)
Received PA request for pt's Lyrica 75 mg caps One BID #60.  Medication is non-preferred.  MD must complete form and fax back to [facility] for approval.  Form placed in pcp's box for completion.  Phone call completed.Kingsley Spittle Cassady9/5/201412:16 PM

## 2013-01-10 ENCOUNTER — Other Ambulatory Visit: Payer: Self-pay | Admitting: *Deleted

## 2013-01-10 NOTE — Telephone Encounter (Signed)
Pt has an appt now w/ you on 10/27 for reassessing back and abd pain. If you choose to fill this request would you like a urine drug screen when she picks the script up?

## 2013-01-13 ENCOUNTER — Other Ambulatory Visit: Payer: Self-pay | Admitting: Internal Medicine

## 2013-01-13 DIAGNOSIS — R1032 Left lower quadrant pain: Secondary | ICD-10-CM

## 2013-01-13 DIAGNOSIS — R51 Headache: Secondary | ICD-10-CM

## 2013-01-13 DIAGNOSIS — F341 Dysthymic disorder: Secondary | ICD-10-CM

## 2013-01-13 MED ORDER — GABAPENTIN 300 MG PO CAPS: ORAL_CAPSULE | ORAL | Status: DC

## 2013-01-13 MED ORDER — AMITRIPTYLINE HCL 25 MG PO TABS: 50.0000 mg | ORAL_TABLET | Freq: Every day | ORAL | Status: DC

## 2013-01-13 NOTE — Telephone Encounter (Signed)
Shirley [name] is not to receive chronic opioids due to her gastroparesis which is managed by Dr. Elnoria Howard.  If she is still having abdominal pains then she needs to contact his office. I have submitted pre-approval for the Lyrica.  Until then I will restart her on gabapentin and amitriptyline for her neuropathic pain and fibromyalgia.

## 2013-01-15 ENCOUNTER — Telehealth: Payer: Self-pay | Admitting: *Deleted

## 2013-01-15 NOTE — Telephone Encounter (Signed)
Pt states she was in [facility] 9/13, she has a lot of back,knee and L shoulder, she has been going to a chiropractor, Dr Logan Bores on [address] [phone], this is why she needed pain med. She will call dr hung though.

## 2013-01-15 NOTE — Telephone Encounter (Signed)
Pt has been called 3 times, no answer, left message this am for call back

## 2013-02-17 ENCOUNTER — Encounter: Payer: Medicaid Other | Admitting: Internal Medicine

## 2013-02-21 ENCOUNTER — Other Ambulatory Visit: Payer: Self-pay | Admitting: *Deleted

## 2013-02-21 NOTE — Telephone Encounter (Signed)
A user error has taken place: encounter opened in error, closed for administrative reasons.Criss Alvine, Marquel Spoto Cassady10/31/201411:55 AM  .

## 2013-02-24 ENCOUNTER — Encounter: Payer: Medicaid Other | Admitting: Internal Medicine

## 2013-02-24 ENCOUNTER — Other Ambulatory Visit: Payer: Medicaid Other

## 2013-02-24 ENCOUNTER — Other Ambulatory Visit: Payer: Self-pay | Admitting: Internal Medicine

## 2013-03-03 ENCOUNTER — Emergency Department (HOSPITAL_COMMUNITY)
Admission: EM | Admit: 2013-03-03 | Discharge: 2013-03-04 | Disposition: A | Discharge status: Home / Self Care | Payer: Medicaid Other | Attending: Emergency Medicine | Admitting: Emergency Medicine

## 2013-03-03 ENCOUNTER — Encounter (HOSPITAL_COMMUNITY): Payer: Self-pay | Admitting: Emergency Medicine

## 2013-03-03 DIAGNOSIS — E1165 Type 2 diabetes mellitus with hyperglycemia: Secondary | ICD-10-CM | POA: Insufficient documentation

## 2013-03-03 DIAGNOSIS — Z8739 Personal history of other diseases of the musculoskeletal system and connective tissue: Secondary | ICD-10-CM | POA: Insufficient documentation

## 2013-03-03 DIAGNOSIS — IMO0002 Reserved for concepts with insufficient information to code with codable children: Secondary | ICD-10-CM | POA: Insufficient documentation

## 2013-03-03 DIAGNOSIS — Z862 Personal history of diseases of the blood and blood-forming organs and certain disorders involving the immune mechanism: Secondary | ICD-10-CM | POA: Insufficient documentation

## 2013-03-03 DIAGNOSIS — Z3202 Encounter for pregnancy test, result negative: Secondary | ICD-10-CM | POA: Insufficient documentation

## 2013-03-03 DIAGNOSIS — J45909 Unspecified asthma, uncomplicated: Secondary | ICD-10-CM | POA: Insufficient documentation

## 2013-03-03 DIAGNOSIS — E669 Obesity, unspecified: Secondary | ICD-10-CM | POA: Insufficient documentation

## 2013-03-03 DIAGNOSIS — Z87891 Personal history of nicotine dependence: Secondary | ICD-10-CM | POA: Insufficient documentation

## 2013-03-03 DIAGNOSIS — Z87440 Personal history of urinary (tract) infections: Secondary | ICD-10-CM | POA: Insufficient documentation

## 2013-03-03 DIAGNOSIS — I252 Old myocardial infarction: Secondary | ICD-10-CM | POA: Insufficient documentation

## 2013-03-03 DIAGNOSIS — R609 Edema, unspecified: Secondary | ICD-10-CM | POA: Insufficient documentation

## 2013-03-03 DIAGNOSIS — Z8742 Personal history of other diseases of the female genital tract: Secondary | ICD-10-CM | POA: Insufficient documentation

## 2013-03-03 DIAGNOSIS — R112 Nausea with vomiting, unspecified: Secondary | ICD-10-CM | POA: Insufficient documentation

## 2013-03-03 DIAGNOSIS — R739 Hyperglycemia, unspecified: Secondary | ICD-10-CM

## 2013-03-03 DIAGNOSIS — Z9861 Coronary angioplasty status: Secondary | ICD-10-CM | POA: Insufficient documentation

## 2013-03-03 DIAGNOSIS — G43909 Migraine, unspecified, not intractable, without status migrainosus: Secondary | ICD-10-CM | POA: Insufficient documentation

## 2013-03-03 DIAGNOSIS — Z794 Long term (current) use of insulin: Secondary | ICD-10-CM | POA: Insufficient documentation

## 2013-03-03 DIAGNOSIS — Z79899 Other long term (current) drug therapy: Secondary | ICD-10-CM | POA: Insufficient documentation

## 2013-03-03 DIAGNOSIS — Z8673 Personal history of transient ischemic attack (TIA), and cerebral infarction without residual deficits: Secondary | ICD-10-CM | POA: Insufficient documentation

## 2013-03-03 DIAGNOSIS — I10 Essential (primary) hypertension: Secondary | ICD-10-CM | POA: Insufficient documentation

## 2013-03-03 DIAGNOSIS — R000 Tachycardia, unspecified: Secondary | ICD-10-CM | POA: Insufficient documentation

## 2013-03-03 DIAGNOSIS — Z8619 Personal history of other infectious and parasitic diseases: Secondary | ICD-10-CM | POA: Insufficient documentation

## 2013-03-03 DIAGNOSIS — R1013 Epigastric pain: Secondary | ICD-10-CM | POA: Insufficient documentation

## 2013-03-03 DIAGNOSIS — R21 Rash and other nonspecific skin eruption: Secondary | ICD-10-CM | POA: Insufficient documentation

## 2013-03-03 DIAGNOSIS — K219 Gastro-esophageal reflux disease without esophagitis: Secondary | ICD-10-CM | POA: Insufficient documentation

## 2013-03-03 DIAGNOSIS — I2510 Atherosclerotic heart disease of native coronary artery without angina pectoris: Secondary | ICD-10-CM | POA: Insufficient documentation

## 2013-03-03 DIAGNOSIS — G609 Hereditary and idiopathic neuropathy, unspecified: Secondary | ICD-10-CM | POA: Insufficient documentation

## 2013-03-03 DIAGNOSIS — F329 Major depressive disorder, single episode, unspecified: Secondary | ICD-10-CM | POA: Insufficient documentation

## 2013-03-03 DIAGNOSIS — Z872 Personal history of diseases of the skin and subcutaneous tissue: Secondary | ICD-10-CM | POA: Insufficient documentation

## 2013-03-03 DIAGNOSIS — F3289 Other specified depressive episodes: Secondary | ICD-10-CM | POA: Insufficient documentation

## 2013-03-03 DIAGNOSIS — F411 Generalized anxiety disorder: Secondary | ICD-10-CM | POA: Insufficient documentation

## 2013-03-03 MED ORDER — SODIUM CHLORIDE 0.9 % IV BOLUS (SEPSIS)
1000.0000 mL | Freq: Once | INTRAVENOUS | Status: AC
  Administered 2013-03-04: 1000 mL via INTRAVENOUS

## 2013-03-03 MED ORDER — HYDROMORPHONE HCL PF 1 MG/ML IJ SOLN
1.0000 mg | Freq: Once | INTRAMUSCULAR | Status: AC
  Administered 2013-03-04: 1 mg via INTRAVENOUS
  Filled 2013-03-03: qty 1

## 2013-03-03 MED ORDER — ONDANSETRON HCL 4 MG/2ML IJ SOLN
4.0000 mg | Freq: Once | INTRAMUSCULAR | Status: AC
  Administered 2013-03-04: 4 mg via INTRAVENOUS
  Filled 2013-03-03: qty 2

## 2013-03-03 NOTE — ED Provider Notes (Signed)
CSN: [id number]     Arrival date & time 03/03/13  2137 History   First MD Initiated Contact with Patient 03/03/13 2325     Chief Complaint  Patient presents with  . Emesis  . Headache  . Rash  . Leg Swelling   (Consider location/radiation/quality/duration/timing/severity/associated sxs/prior Treatment) HPI HX per PT - Has DM and h/o chronic vomiting and tonight presents with N/V since 6pm, multiple episodes, no hematemesis, no coffee ground emesis. No diarrhea or blood in stools. No fevers, no recent travel, no known sick contacts. This feels like her recurrent chronic symptoms. Mod in severity. No antiemetics at home, followed by [facility].   PT also has a rash to bilateral forearms for the last week. Is flesh colored, mild itching. No h/o same, no known exposures and no h/o same. No palmar involvement, no recent tick exposure.    Past Medical History  Diagnosis Date  . Thrombocytosis     Hem/Onc suggested 2/2 chronic hepatits and/or iron deficiency anemia  . Iron deficiency anemia   . N&V (nausea and vomiting)     Chronic. Unclear etiology with multiple admission and ED visits. CT abdomen with and without contrast (02/2011)  showed no acute process. Gastic Emptying scan (01/2010) was normal. Ultrasound of the abdomen was within normal limits. Hepatitis B viral load was undectable. HIV NR. EGD - gastritis, Hpylori + s/p Rx  . Abdominal pain   . Hypertension   . Hyperlipidemia   . Diabetes mellitus type 2, uncontrolled, with complications   . Recurrent boils   . Back pain   . OSA (obstructive sleep apnea)   . 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 Cath 12/10- no restenosis or significant CAD progression  . Irregular menses     Small ovarian follicles seen on CT(9/06)  . History of pyelonephritis     H/o GrpB Pyelonephritis (9/06) and UTI- 07/11- E.Coli, 12/10- GBS  .  Abscess of tunica vaginalis     10/09- Abundant S. aureus- sensitive to all abx  . Obesity   . Asthma   . Gastritis   . Depression   . Hepatitis B, chronic     Hep BeAb+,Hep B cAb+ & Hep BsAg+ (9/06)  . Peripheral neuropathy   . Fibromyalgia   . GERD (gastroesophageal reflux disease)   . Migraine   . Anxiety   . Gastroparesis     secondary to poorly controlled DM, last emptying study performed 01/2010  was normal but may be falsely positive as pt was on reglan  . Blood dyscrasia   . H/O: CVA (cerebrovascular accident)     history of remote right cerebellar infarct noted on head CT at least since 10/2011   Past Surgical History  Procedure Laterality Date  . Cesarean section  1997   Family History  Problem Relation Age of Onset  . Diabetes Father    History  Substance Use Topics  . Smoking status: Former Smoker    Types: Cigarettes    Quit date: 04/24/1996  . Smokeless tobacco: Never Used     Comment: quit smoking cigarettes age 2  . Alcohol Use: Yes     Comment: occasionally   OB History   Grav Para Term Preterm Abortions TAB SAB Ect Mult Living                 Review of Systems  Constitutional:  Negative for fever and chills.  Eyes: Negative for visual disturbance.  Respiratory: Negative for shortness of breath.   Cardiovascular: Negative for chest pain.  Gastrointestinal: Positive for nausea and vomiting. Negative for abdominal pain.  Genitourinary: Negative for dysuria.  Musculoskeletal: Negative for back pain, neck pain and neck stiffness.  Skin: Positive for rash.  Neurological: Negative for seizures, syncope and speech difficulty.  All other systems reviewed and are negative.    Allergies  Lisinopril  Home Medications   Current Outpatient Rx  Name  Route  Sig  Dispense  Refill  . acetaminophen (TYLENOL) 500 MG tablet   Oral   Take 1,000 mg by mouth every 6 (six) hours as needed for pain.         Marland Kitchen albuterol (PROVENTIL HFA;VENTOLIN HFA) 108 (90  BASE) MCG/ACT inhaler   Inhalation   Inhale 2 puffs into the lungs every 4 (four) hours as needed. For shortness of breath   1 Inhaler   6   . amitriptyline (ELAVIL) 25 MG tablet   Oral   Take 2 tablets (50 mg total) by mouth at bedtime.   60 tablet   3   . Fluticasone-Salmeterol (ADVAIR) 100-50 MCG/DOSE AEPB   Inhalation   Inhale 1 puff into the lungs every 12 (twelve) hours.   60 each   6   . gabapentin (NEURONTIN) 300 MG capsule      TAKE 1 TABLET BY MOUTH THREE TIMES A DAY   90 capsule   3   . insulin aspart (NOVOLOG) 100 unit/ml SOLN      Check sugar level before meals and inject Novolog as listed below. If sugar 150-199: inject 2 unit;  200-249: 4 units, 250-299: 7 units; 300-349: 10 units; Over 350: 12 units   1 pen   6   . Insulin Glargine (LANTUS SOLOSTAR) 100 UNIT/ML SOPN      INJECT 40 UNITS UNDER THE SKIN EVERY NIGHT AT BEDTIME   3 mL   0     PT WAS PRESCRIBED VIALS FROM ER BUT SHE IS USING T ...   . omeprazole (PRILOSEC) 40 MG capsule   Oral   Take 1 capsule (40 mg total) by mouth daily.   31 capsule   6   . pregabalin (LYRICA) 75 MG capsule   Oral   Take 1 capsule (75 mg total) by mouth 2 (two) times daily.   60 capsule   6    BP 167/98  Pulse 109  Temp(Src) 98.7 F (37.1 C) (Oral)  Resp 16  SpO2 96%  LMP 01/06/2013 Physical Exam  Constitutional: She is oriented to person, place, and time.  Eyes: No scleral icterus.  Cardiovascular:  Tachycardic 100-110  Pulmonary/Chest: Effort normal and breath sounds normal. She exhibits no tenderness.  Abdominal: Soft. Bowel sounds are normal. She exhibits no distension. There is no rebound and no guarding.  Mild epigastric tenderness, neg Murphys sign  Musculoskeletal: Normal range of motion. She exhibits no tenderness.  Mild symmetric pretibial edema. Bilateral FAs with mild raised erythematous rash, no petechiae and no palmar involvement. No excoriations or vesicles.  Neurological: She is alert  and oriented to person, place, and time. No cranial nerve deficit.    ED Course  Procedures (including critical care time) Labs Review Labs Reviewed  CBC - Abnormal; Notable for the following:    Hemoglobin 10.1 (*)    HCT 30.0 (*)    MCV 64.1 (*)    MCH 21.6 (*)  RDW 17.6 (*)    All other components within normal limits  BASIC METABOLIC PANEL - Abnormal; Notable for the following:    Sodium 131 (*)    Glucose, Bld 373 (*)    All other components within normal limits  URINALYSIS, ROUTINE W REFLEX MICROSCOPIC - Abnormal; Notable for the following:    Specific Gravity, Urine 1.039 (*)    Glucose, UA >1000 (*)    Hgb urine dipstick LARGE (*)    All other components within normal limits  BLOOD GAS, ARTERIAL - Abnormal; Notable for the following:    pCO2 arterial 34.8 (*)    All other components within normal limits  GLUCOSE, CAPILLARY - Abnormal; Notable for the following:    Glucose-Capillary 247 (*)    All other components within normal limits  URINE MICROSCOPIC-ADD ON  POCT PREGNANCY, URINE   IVFs, IV zofran On recheck remains nauseated. Blood sugar improving Improved with IV phenergan. PT declines admit  PLan d/c home with RX phenergan and close PCP follow up. She will try 1%hydrocortisone cream for UE rash. Return precautions provided and verbalized as understood.    MDM  Dx: Nausea and vomiting, hyperglycemia, Rash  Improved with IVFs and antiemetics Labs and UA reviewed VS and nurses reviewed and considered    Sunnie Nielsen, MD 03/04/13 0500

## 2013-03-03 NOTE — ED Notes (Signed)
Pt states she is having nausea, vomiting, and headache that started about 3 hrs ago  Pt states she is also having swelling to her legs and a rash on her hands and arms  Pt having dry heaving in triage

## 2013-03-04 LAB — CBC
HCT: 30 % — ABNORMAL LOW (ref 36.0–46.0)
MCH: 21.6 pg — ABNORMAL LOW (ref 26.0–34.0)
MCHC: 33.7 g/dL (ref 30.0–36.0)
MCV: 64.1 fL — ABNORMAL LOW (ref 78.0–100.0)
RBC: 4.68 MIL/uL (ref 3.87–5.11)
RDW: 17.6 % — ABNORMAL HIGH (ref 11.5–15.5)

## 2013-03-04 LAB — BASIC METABOLIC PANEL
BUN: 14 mg/dL (ref 6–23)
Calcium: 9.5 mg/dL (ref 8.4–10.5)
Creatinine, Ser: 0.75 mg/dL (ref 0.50–1.10)
GFR calc Af Amer: >90 mL/min (ref >90)
GFR calc non Af Amer: >90 mL/min (ref >90)
Sodium: 131 mEq/L — ABNORMAL LOW (ref 135–145)

## 2013-03-04 LAB — BLOOD GAS, ARTERIAL
Acid-base deficit: 1.1 mmol/L (ref 0.0–2.0)
Bicarbonate: 22.4 mEq/L (ref 20.0–24.0)
Drawn by: 11249
FIO2: 0.21 %
O2 Saturation: 95.9 %
TCO2: 20.8 mmol/L (ref 0–100)
pCO2 arterial: 34.8 mmHg — ABNORMAL LOW (ref 35.0–45.0)
pH, Arterial: 7.426 (ref 7.350–7.450)
pO2, Arterial: 85.7 mmHg (ref 80.0–100.0)

## 2013-03-04 LAB — URINALYSIS, ROUTINE W REFLEX MICROSCOPIC
Bilirubin Urine: NEGATIVE
Ketones, ur: NEGATIVE mg/dL
Nitrite: NEGATIVE
Protein, ur: NEGATIVE mg/dL
Urobilinogen, UA: 1 mg/dL (ref 0.0–1.0)
pH: 5.5 (ref 5.0–8.0)

## 2013-03-04 LAB — POCT PREGNANCY, URINE: Preg Test, Ur: NEGATIVE

## 2013-03-04 LAB — URINE MICROSCOPIC-ADD ON

## 2013-03-04 MED ORDER — SODIUM CHLORIDE 0.9 % IV BOLUS (SEPSIS): 1000.0000 mL | Freq: Once | INTRAVENOUS | Status: DC

## 2013-03-04 MED ORDER — HYDROMORPHONE HCL PF 1 MG/ML IJ SOLN
1.0000 mg | Freq: Once | INTRAMUSCULAR | Status: AC
  Administered 2013-03-04: 1 mg via INTRAVENOUS
  Filled 2013-03-04: qty 1

## 2013-03-04 MED ORDER — PROMETHAZINE HCL 25 MG PO TABS: 25.0000 mg | ORAL_TABLET | Freq: Four times a day (QID) | ORAL | Status: DC | PRN

## 2013-03-04 MED ORDER — HYDROCORTISONE 1 % EX CREA
TOPICAL_CREAM | Freq: Two times a day (BID) | CUTANEOUS | Status: DC
  Administered 2013-03-04: 06:00:00 via TOPICAL
  Filled 2013-03-04: qty 28

## 2013-03-04 MED ORDER — ONDANSETRON HCL 4 MG/2ML IJ SOLN
4.0000 mg | Freq: Once | INTRAMUSCULAR | Status: AC
  Administered 2013-03-04: 4 mg via INTRAVENOUS
  Filled 2013-03-04: qty 2

## 2013-03-04 MED ORDER — PROMETHAZINE HCL 25 MG/ML IJ SOLN
25.0000 mg | Freq: Once | INTRAMUSCULAR | Status: AC
  Administered 2013-03-04: 25 mg via INTRAVENOUS
  Filled 2013-03-04: qty 1

## 2013-03-04 NOTE — ED Notes (Signed)
Pt c/o nausea  Dr Dierdre Highman notified

## 2013-03-22 ENCOUNTER — Emergency Department (HOSPITAL_COMMUNITY)
Admission: EM | Admit: 2013-03-22 | Discharge: 2013-03-22 | Disposition: A | Discharge status: Home / Self Care | Payer: Medicaid Other | Attending: Emergency Medicine | Admitting: Emergency Medicine

## 2013-03-22 DIAGNOSIS — IMO0001 Reserved for inherently not codable concepts without codable children: Secondary | ICD-10-CM | POA: Insufficient documentation

## 2013-03-22 DIAGNOSIS — Z87891 Personal history of nicotine dependence: Secondary | ICD-10-CM | POA: Insufficient documentation

## 2013-03-22 DIAGNOSIS — K219 Gastro-esophageal reflux disease without esophagitis: Secondary | ICD-10-CM | POA: Insufficient documentation

## 2013-03-22 DIAGNOSIS — E1165 Type 2 diabetes mellitus with hyperglycemia: Secondary | ICD-10-CM | POA: Insufficient documentation

## 2013-03-22 DIAGNOSIS — E669 Obesity, unspecified: Secondary | ICD-10-CM | POA: Insufficient documentation

## 2013-03-22 DIAGNOSIS — F329 Major depressive disorder, single episode, unspecified: Secondary | ICD-10-CM | POA: Insufficient documentation

## 2013-03-22 DIAGNOSIS — Z794 Long term (current) use of insulin: Secondary | ICD-10-CM | POA: Insufficient documentation

## 2013-03-22 DIAGNOSIS — R739 Hyperglycemia, unspecified: Secondary | ICD-10-CM

## 2013-03-22 DIAGNOSIS — Z8742 Personal history of other diseases of the female genital tract: Secondary | ICD-10-CM | POA: Insufficient documentation

## 2013-03-22 DIAGNOSIS — F3289 Other specified depressive episodes: Secondary | ICD-10-CM | POA: Insufficient documentation

## 2013-03-22 DIAGNOSIS — Z87440 Personal history of urinary (tract) infections: Secondary | ICD-10-CM | POA: Insufficient documentation

## 2013-03-22 DIAGNOSIS — Z79899 Other long term (current) drug therapy: Secondary | ICD-10-CM | POA: Insufficient documentation

## 2013-03-22 DIAGNOSIS — Z862 Personal history of diseases of the blood and blood-forming organs and certain disorders involving the immune mechanism: Secondary | ICD-10-CM | POA: Insufficient documentation

## 2013-03-22 DIAGNOSIS — Z8619 Personal history of other infectious and parasitic diseases: Secondary | ICD-10-CM | POA: Insufficient documentation

## 2013-03-22 DIAGNOSIS — Z8673 Personal history of transient ischemic attack (TIA), and cerebral infarction without residual deficits: Secondary | ICD-10-CM | POA: Insufficient documentation

## 2013-03-22 DIAGNOSIS — R1032 Left lower quadrant pain: Secondary | ICD-10-CM

## 2013-03-22 DIAGNOSIS — R112 Nausea with vomiting, unspecified: Secondary | ICD-10-CM

## 2013-03-22 DIAGNOSIS — I2510 Atherosclerotic heart disease of native coronary artery without angina pectoris: Secondary | ICD-10-CM | POA: Insufficient documentation

## 2013-03-22 DIAGNOSIS — R51 Headache: Secondary | ICD-10-CM

## 2013-03-22 DIAGNOSIS — F341 Dysthymic disorder: Secondary | ICD-10-CM

## 2013-03-22 DIAGNOSIS — IMO0002 Reserved for concepts with insufficient information to code with codable children: Secondary | ICD-10-CM | POA: Insufficient documentation

## 2013-03-22 DIAGNOSIS — F411 Generalized anxiety disorder: Secondary | ICD-10-CM | POA: Insufficient documentation

## 2013-03-22 DIAGNOSIS — G609 Hereditary and idiopathic neuropathy, unspecified: Secondary | ICD-10-CM | POA: Insufficient documentation

## 2013-03-22 DIAGNOSIS — G43909 Migraine, unspecified, not intractable, without status migrainosus: Secondary | ICD-10-CM | POA: Insufficient documentation

## 2013-03-22 DIAGNOSIS — R000 Tachycardia, unspecified: Secondary | ICD-10-CM | POA: Insufficient documentation

## 2013-03-22 DIAGNOSIS — J45909 Unspecified asthma, uncomplicated: Secondary | ICD-10-CM | POA: Insufficient documentation

## 2013-03-22 DIAGNOSIS — I10 Essential (primary) hypertension: Secondary | ICD-10-CM | POA: Insufficient documentation

## 2013-03-22 DIAGNOSIS — Z872 Personal history of diseases of the skin and subcutaneous tissue: Secondary | ICD-10-CM | POA: Insufficient documentation

## 2013-03-22 DIAGNOSIS — E119 Type 2 diabetes mellitus without complications: Secondary | ICD-10-CM

## 2013-03-22 LAB — BLOOD GAS, VENOUS
Bicarbonate: 23.1 mEq/L (ref 20.0–24.0)
FIO2: 0.21 %
O2 Saturation: 64.5 %
Patient temperature: 37
pCO2, Ven: 40.1 mmHg — ABNORMAL LOW (ref 45.0–50.0)
pO2, Ven: 37.8 mmHg (ref 30.0–45.0)

## 2013-03-22 LAB — CBC WITH DIFFERENTIAL/PLATELET
Basophils Absolute: 0 10*3/uL (ref 0.0–0.1)
Lymphs Abs: 4.1 10*3/uL — ABNORMAL HIGH (ref 0.7–4.0)
MCH: 21.3 pg — ABNORMAL LOW (ref 26.0–34.0)
MCHC: 33.5 g/dL (ref 30.0–36.0)
MCV: 63.4 fL — ABNORMAL LOW (ref 78.0–100.0)
Monocytes Absolute: 1 10*3/uL (ref 0.1–1.0)
Monocytes Relative: 6 % (ref 3–12)
Neutrophils Relative %: 66 % (ref 43–77)
Platelets: 507 10*3/uL — ABNORMAL HIGH (ref 150–400)
RBC: 5.17 MIL/uL — ABNORMAL HIGH (ref 3.87–5.11)
RDW: 17.6 % — ABNORMAL HIGH (ref 11.5–15.5)

## 2013-03-22 LAB — COMPREHENSIVE METABOLIC PANEL
ALT: 7 U/L (ref 0–35)
Albumin: 3.5 g/dL (ref 3.5–5.2)
BUN: 14 mg/dL (ref 6–23)
Creatinine, Ser: 0.89 mg/dL (ref 0.50–1.10)
GFR calc Af Amer: >90 mL/min (ref >90)
GFR calc non Af Amer: 81 mL/min — ABNORMAL LOW (ref >90)
Glucose, Bld: 565 mg/dL (ref 70–99)
Total Bilirubin: 0.1 mg/dL — ABNORMAL LOW (ref 0.3–1.2)
Total Protein: 8.6 g/dL — ABNORMAL HIGH (ref 6.0–8.3)

## 2013-03-22 LAB — GLUCOSE, CAPILLARY: Glucose-Capillary: 279 mg/dL — ABNORMAL HIGH (ref 70–99)

## 2013-03-22 LAB — LIPASE, BLOOD: Lipase: 52 U/L (ref 11–59)

## 2013-03-22 MED ORDER — GABAPENTIN 300 MG PO CAPS: ORAL_CAPSULE | ORAL | Status: DC

## 2013-03-22 MED ORDER — AMITRIPTYLINE HCL 25 MG PO TABS: 50.0000 mg | ORAL_TABLET | Freq: Every day | ORAL | Status: DC

## 2013-03-22 MED ORDER — PROMETHAZINE HCL 25 MG/ML IJ SOLN
25.0000 mg | Freq: Once | INTRAMUSCULAR | Status: AC
  Administered 2013-03-22: 25 mg via INTRAMUSCULAR
  Filled 2013-03-22: qty 1

## 2013-03-22 MED ORDER — INSULIN GLARGINE 100 UNIT/ML ~~LOC~~ SOLN: 2.0000 [IU] | Freq: Every day | SUBCUTANEOUS | Status: DC

## 2013-03-22 MED ORDER — LABETALOL HCL 100 MG PO TABS: 100.0000 mg | ORAL_TABLET | Freq: Two times a day (BID) | ORAL | Status: DC

## 2013-03-22 MED ORDER — INSULIN ASPART 100 UNIT/ML ~~LOC~~ SOLN
10.0000 [IU] | Freq: Once | SUBCUTANEOUS | Status: AC
  Administered 2013-03-22: 10 [IU] via INTRAVENOUS
  Filled 2013-03-22: qty 1

## 2013-03-22 MED ORDER — HYDROMORPHONE HCL PF 1 MG/ML IJ SOLN
1.0000 mg | Freq: Once | INTRAMUSCULAR | Status: AC
  Administered 2013-03-22: 1 mg via INTRAVENOUS
  Filled 2013-03-22: qty 1

## 2013-03-22 MED ORDER — FLUTICASONE-SALMETEROL 100-50 MCG/DOSE IN AEPB: 1.0000 | INHALATION_SPRAY | Freq: Two times a day (BID) | RESPIRATORY_TRACT | Status: DC

## 2013-03-22 MED ORDER — DIPHENHYDRAMINE HCL 50 MG/ML IJ SOLN
25.0000 mg | Freq: Once | INTRAMUSCULAR | Status: AC
  Administered 2013-03-22: 25 mg via INTRAVENOUS
  Filled 2013-03-22: qty 1

## 2013-03-22 MED ORDER — METOCLOPRAMIDE HCL 5 MG/ML IJ SOLN
10.0000 mg | Freq: Once | INTRAMUSCULAR | Status: AC
  Administered 2013-03-22: 10 mg via INTRAVENOUS
  Filled 2013-03-22: qty 2

## 2013-03-22 MED ORDER — SUMATRIPTAN SUCCINATE 6 MG/0.5ML ~~LOC~~ SOLN
6.0000 mg | Freq: Once | SUBCUTANEOUS | Status: AC
  Administered 2013-03-22: 6 mg via SUBCUTANEOUS
  Filled 2013-03-22: qty 0.5

## 2013-03-22 MED ORDER — ALBUTEROL SULFATE HFA 108 (90 BASE) MCG/ACT IN AERS: 2.0000 | INHALATION_SPRAY | RESPIRATORY_TRACT | Status: DC | PRN

## 2013-03-22 MED ORDER — PROMETHAZINE HCL 25 MG PO TABS: 25.0000 mg | ORAL_TABLET | Freq: Four times a day (QID) | ORAL | Status: DC | PRN

## 2013-03-22 MED ORDER — INSULIN GLARGINE 100 UNIT/ML SOLOSTAR PEN: PEN_INJECTOR | SUBCUTANEOUS | Status: DC

## 2013-03-22 MED ORDER — SODIUM CHLORIDE 0.9 % IV BOLUS (SEPSIS)
1000.0000 mL | Freq: Once | INTRAVENOUS | Status: AC
  Administered 2013-03-22: 1000 mL via INTRAVENOUS

## 2013-03-22 MED ORDER — OMEPRAZOLE 40 MG PO CPDR: 40.0000 mg | DELAYED_RELEASE_CAPSULE | Freq: Every day | ORAL | Status: DC

## 2013-03-22 NOTE — ED Provider Notes (Addendum)
CSN: [id number]     Arrival date & time 03/22/13  1119 History   First MD Initiated Contact with Patient 03/22/13 1150     Chief Complaint  Patient presents with  . Nausea  . Emesis  . Headache   (Consider location/radiation/quality/duration/timing/severity/associated sxs/prior Treatment) Patient is a 37 y.o. female presenting with headaches. The history is provided by the patient.  Headache Pain location:  Frontal Quality:  Dull Radiates to:  Face Severity currently:  10/10 Severity at highest:  10/10 Onset quality:  Gradual Duration:  2 days Timing:  Constant Progression:  Worsening Chronicity:  Recurrent Similar to prior headaches: yes   Context: activity, bright light, emotional stress and loud noise   Relieved by:  Nothing Worsened by:  Light, sound and activity Ineffective treatments: aleve. Associated symptoms: nausea, photophobia and vomiting   Associated symptoms: no abdominal pain, no congestion, no cough, no fever, no focal weakness, no loss of balance, no neck pain, no neck stiffness, no numbness and no paresthesias   Risk factors comment:  Hx of migraines, chronic N/V and DM   Past Medical History  Diagnosis Date  . Thrombocytosis     Hem/Onc suggested 2/2 chronic hepatits and/or iron deficiency anemia  . Iron deficiency anemia   . N&V (nausea and vomiting)     Chronic. Unclear etiology with multiple admission and ED visits. CT abdomen with and without contrast (02/2011)  showed no acute process. Gastic Emptying scan (01/2010) was normal. Ultrasound of the abdomen was within normal limits. Hepatitis B viral load was undectable. HIV NR. EGD - gastritis, Hpylori + s/p Rx  . Abdominal pain   . Hypertension   . Hyperlipidemia   . Diabetes mellitus type 2, uncontrolled, with complications   . Recurrent boils   . Back pain   . OSA (obstructive sleep apnea)   . 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 Cath 12/10- no restenosis or significant CAD progression  . Irregular menses     Small ovarian follicles seen on CT(9/06)  . History of pyelonephritis     H/o GrpB Pyelonephritis (9/06) and UTI- 07/11- E.Coli, 12/10- GBS  . Abscess of tunica vaginalis     10/09- Abundant S. aureus- sensitive to all abx  . Obesity   . Asthma   . Gastritis   . Depression   . Hepatitis B, chronic     Hep BeAb+,Hep B cAb+ & Hep BsAg+ (9/06)  . Peripheral neuropathy   . Fibromyalgia   . GERD (gastroesophageal reflux disease)   . Migraine   . Anxiety   . Gastroparesis     secondary to poorly controlled DM, last emptying study performed 01/2010  was normal but may be falsely positive as pt was on reglan  . Blood dyscrasia   . H/O: CVA (cerebrovascular accident)     history of remote right cerebellar infarct noted on head CT at least since 10/2011   Past Surgical History  Procedure Laterality Date  . Cesarean section  1997   Family History  Problem Relation Age of Onset  . Diabetes Father    History  Substance Use Topics  . Smoking status: Former Smoker    Types: Cigarettes    Quit date: 04/24/1996  . Smokeless tobacco: Never Used     Comment: quit smoking cigarettes age 22  . Alcohol Use: Yes     Comment:  occasionally   OB History   Grav Para Term Preterm Abortions TAB SAB Ect Mult Living                 Review of Systems  Constitutional: Negative for fever.  HENT: Negative for congestion.   Eyes: Positive for photophobia.  Respiratory: Negative for cough.   Gastrointestinal: Positive for nausea and vomiting. Negative for abdominal pain.  Musculoskeletal: Negative for neck pain and neck stiffness.  Neurological: Positive for headaches. Negative for focal weakness, numbness, paresthesias and loss of balance.    Allergies  Lisinopril  Home Medications   Current Outpatient Rx  Name  Route  Sig  Dispense  Refill  .  acetaminophen (TYLENOL) 500 MG tablet   Oral   Take 1,000 mg by mouth every 6 (six) hours as needed for pain.         Marland Kitchen albuterol (PROVENTIL HFA;VENTOLIN HFA) 108 (90 BASE) MCG/ACT inhaler   Inhalation   Inhale 2 puffs into the lungs every 4 (four) hours as needed. For shortness of breath   1 Inhaler   6   . amitriptyline (ELAVIL) 25 MG tablet   Oral   Take 2 tablets (50 mg total) by mouth at bedtime.   60 tablet   3   . calcium carbonate (TUMS - DOSED IN MG ELEMENTAL CALCIUM) 500 MG chewable tablet   Oral   Chew 1 tablet by mouth daily.         . Fluticasone-Salmeterol (ADVAIR) 100-50 MCG/DOSE AEPB   Inhalation   Inhale 1 puff into the lungs every 12 (twelve) hours.   60 each   6   . gabapentin (NEURONTIN) 300 MG capsule      TAKE 1 TABLET BY MOUTH THREE TIMES A DAY   90 capsule   3   . insulin aspart (NOVOLOG) 100 unit/ml SOLN      Check sugar level before meals and inject Novolog as listed below. If sugar 150-199: inject 2 unit;  200-249: 4 units, 250-299: 7 units; 300-349: 10 units; Over 350: 12 units   1 pen   6   . Insulin Glargine (LANTUS SOLOSTAR) 100 UNIT/ML SOPN      INJECT 40 UNITS UNDER THE SKIN EVERY NIGHT AT BEDTIME   3 mL   0     PT WAS PRESCRIBED VIALS FROM ER BUT SHE IS USING T ...   . omeprazole (PRILOSEC) 40 MG capsule   Oral   Take 1 capsule (40 mg total) by mouth daily.   31 capsule   6   . pregabalin (LYRICA) 75 MG capsule   Oral   Take 1 capsule (75 mg total) by mouth 2 (two) times daily.   60 capsule   6   . promethazine (PHENERGAN) 25 MG tablet   Oral   Take 1 tablet (25 mg total) by mouth every 6 (six) hours as needed for nausea or vomiting.   30 tablet   0    BP 162/96  Pulse 115  Temp(Src) 98.9 F (37.2 C) (Oral)  Resp 18  SpO2 100%  LMP 03/08/2013 Physical Exam  Nursing note and vitals reviewed. Constitutional: She is oriented to person, place, and time. She appears well-developed and well-nourished. She  appears distressed.  Pt does not smell of ketones  HENT:  Head: Normocephalic and atraumatic.  Mouth/Throat: Oropharynx is clear and moist. Mucous membranes are dry.  Eyes: Conjunctivae and EOM are normal. Pupils are equal, round, and reactive to light.  Right eye exhibits no discharge. Left eye exhibits no discharge.  photophobia  Neck: Normal range of motion. Neck supple. No spinous process tenderness present. No rigidity. No Brudzinski's sign and no Kernig's sign noted.  Cardiovascular: Normal heart sounds and intact distal pulses.  Tachycardia present.   No murmur heard. Pulmonary/Chest: Effort normal and breath sounds normal. No respiratory distress. She has no wheezes. She has no rales.  Abdominal: Soft. She exhibits no distension. There is tenderness.  Mild generalized tenderness but no focal tenderness  Musculoskeletal: Normal range of motion. She exhibits no edema and no tenderness.  Lymphadenopathy:    She has no cervical adenopathy.  Neurological: She is alert and oriented to person, place, and time. She has normal strength. No cranial nerve deficit or sensory deficit. Coordination and gait normal. GCS eye subscore is 4. GCS verbal subscore is 5. GCS motor subscore is 6.  Skin: Skin is warm and dry.  Psychiatric: She has a normal mood and affect. Her behavior is normal.    ED Course  Procedures (including critical care time) Labs Review Labs Reviewed  CBC WITH DIFFERENTIAL - Abnormal; Notable for the following:    WBC 16.5 (*)    RBC 5.17 (*)    Hemoglobin 11.0 (*)    HCT 32.8 (*)    MCV 63.4 (*)    MCH 21.3 (*)    RDW 17.6 (*)    Platelets 507 (*)    Neutro Abs 10.9 (*)    Lymphs Abs 4.1 (*)    All other components within normal limits  COMPREHENSIVE METABOLIC PANEL - Abnormal; Notable for the following:    Sodium 127 (*)    Chloride 92 (*)    Glucose, Bld 565 (*)    Total Protein 8.6 (*)    Total Bilirubin 0.1 (*)    GFR calc non Af Amer 81 (*)    All other  components within normal limits  BLOOD GAS, VENOUS - Abnormal; Notable for the following:    pH, Ven 7.379 (*)    pCO2, Ven 40.1 (*)    All other components within normal limits  GLUCOSE, CAPILLARY - Abnormal; Notable for the following:    Glucose-Capillary 353 (*)    All other components within normal limits  LIPASE, BLOOD  URINALYSIS, ROUTINE W REFLEX MICROSCOPIC   Imaging Review No results found.  EKG Interpretation    Date/Time:  Saturday March 22 2013 12:37:19 EST Ventricular Rate:  96 PR Interval:  120 QRS Duration: 74 QT Interval:  337 QTC Calculation: 426 R Axis:   74 Text Interpretation:  Sinus tachycardia Multiform ventricular premature complexes Borderline T abnormalities, inferior leads No significant change since last tracing Confirmed by Anitra Lauth  MD, Shawna Wearing ([id number]) on 03/22/2013 12:42:47 PM            MDM   1. Migraine   2. Nausea and vomiting in adult   3. Hyperglycemia   4. DIABETES MELLITUS, TYPE II   5. ANXIETY DEPRESSION   6. Abdominal pain, left-sided   7. Headache(784.0)   8. NAUSEA AND VOMITING     Pt with hx of migraines and developed a HA 2 days ago but now however recurrent N/V and pt also has a hx of chronic N/V and is diabetic.  No sx suggestive of SAH(sudden onset, worst of life, or deficits), infection, or cavernous vein thrombosis.  Normal neuro exam and vital signs.  Will ensure pt not in DKA Will give HA cocktail and on re-eval  pt still having nausea but HA improved.  Pt found to have leukocytosis of 16,000 of unknown significance/stable hb and hyperglycemia with BS of >500. Normal Cr and VBG without signs of DKA.  Will continue to hydrate, treat hyperglycemia and N/V.  2:38 PM BS improving now 350.  On re-eval pt still having HA despite dilaudid and reglan/bendaryl.  Pt in the past has had imitrex in June with some improvement and will try that.  3:45 PM Pt states HA is improved.  Vomiting has stopped and tolerating po's.  Pt  states out of her home meds for 1-2 weeks and will refill ppx. Gwyneth Sprout, MD 03/22/13 1438  Gwyneth Sprout, MD 03/22/13 1546  Gwyneth Sprout, MD 03/22/13 1555

## 2013-03-22 NOTE — ED Notes (Signed)
Pt here for NV and headache x 2 days.  States she also has pain in LLQ but states "it always hurts there".  Denies diarrhea.

## 2013-03-22 NOTE — ED Notes (Signed)
MD at bedside. Dr. Anitra Lauth @ bedside.

## 2013-03-22 NOTE — ED Notes (Signed)
Pt urinated but forgot to get sample. Pt aware and will obtain soon

## 2013-03-22 NOTE — ED Notes (Signed)
Dr Anitra Lauth notified of critical lab value: glucose 565

## 2013-03-28 ENCOUNTER — Encounter (HOSPITAL_COMMUNITY): Payer: Self-pay | Admitting: Emergency Medicine

## 2013-03-28 ENCOUNTER — Emergency Department (HOSPITAL_COMMUNITY)
Admission: EM | Admit: 2013-03-28 | Discharge: 2013-03-28 | Disposition: A | Discharge status: Home / Self Care | Payer: Medicaid Other | Attending: Emergency Medicine | Admitting: Emergency Medicine

## 2013-03-28 DIAGNOSIS — E119 Type 2 diabetes mellitus without complications: Secondary | ICD-10-CM | POA: Insufficient documentation

## 2013-03-28 DIAGNOSIS — G8929 Other chronic pain: Secondary | ICD-10-CM | POA: Insufficient documentation

## 2013-03-28 DIAGNOSIS — F329 Major depressive disorder, single episode, unspecified: Secondary | ICD-10-CM | POA: Insufficient documentation

## 2013-03-28 DIAGNOSIS — Z9889 Other specified postprocedural states: Secondary | ICD-10-CM | POA: Insufficient documentation

## 2013-03-28 DIAGNOSIS — I2510 Atherosclerotic heart disease of native coronary artery without angina pectoris: Secondary | ICD-10-CM | POA: Insufficient documentation

## 2013-03-28 DIAGNOSIS — Z79899 Other long term (current) drug therapy: Secondary | ICD-10-CM | POA: Insufficient documentation

## 2013-03-28 DIAGNOSIS — F411 Generalized anxiety disorder: Secondary | ICD-10-CM | POA: Insufficient documentation

## 2013-03-28 DIAGNOSIS — I10 Essential (primary) hypertension: Secondary | ICD-10-CM | POA: Insufficient documentation

## 2013-03-28 DIAGNOSIS — F3289 Other specified depressive episodes: Secondary | ICD-10-CM | POA: Insufficient documentation

## 2013-03-28 DIAGNOSIS — J45909 Unspecified asthma, uncomplicated: Secondary | ICD-10-CM | POA: Insufficient documentation

## 2013-03-28 DIAGNOSIS — E669 Obesity, unspecified: Secondary | ICD-10-CM | POA: Insufficient documentation

## 2013-03-28 DIAGNOSIS — K219 Gastro-esophageal reflux disease without esophagitis: Secondary | ICD-10-CM | POA: Insufficient documentation

## 2013-03-28 DIAGNOSIS — Z8742 Personal history of other diseases of the female genital tract: Secondary | ICD-10-CM | POA: Insufficient documentation

## 2013-03-28 DIAGNOSIS — Z862 Personal history of diseases of the blood and blood-forming organs and certain disorders involving the immune mechanism: Secondary | ICD-10-CM | POA: Insufficient documentation

## 2013-03-28 DIAGNOSIS — Z872 Personal history of diseases of the skin and subcutaneous tissue: Secondary | ICD-10-CM | POA: Insufficient documentation

## 2013-03-28 DIAGNOSIS — K3184 Gastroparesis: Secondary | ICD-10-CM

## 2013-03-28 DIAGNOSIS — Z794 Long term (current) use of insulin: Secondary | ICD-10-CM | POA: Insufficient documentation

## 2013-03-28 DIAGNOSIS — Z8673 Personal history of transient ischemic attack (TIA), and cerebral infarction without residual deficits: Secondary | ICD-10-CM | POA: Insufficient documentation

## 2013-03-28 DIAGNOSIS — R000 Tachycardia, unspecified: Secondary | ICD-10-CM | POA: Insufficient documentation

## 2013-03-28 DIAGNOSIS — Z8619 Personal history of other infectious and parasitic diseases: Secondary | ICD-10-CM | POA: Insufficient documentation

## 2013-03-28 DIAGNOSIS — Z8669 Personal history of other diseases of the nervous system and sense organs: Secondary | ICD-10-CM | POA: Insufficient documentation

## 2013-03-28 DIAGNOSIS — Z87891 Personal history of nicotine dependence: Secondary | ICD-10-CM | POA: Insufficient documentation

## 2013-03-28 DIAGNOSIS — R1032 Left lower quadrant pain: Secondary | ICD-10-CM | POA: Insufficient documentation

## 2013-03-28 DIAGNOSIS — Z3202 Encounter for pregnancy test, result negative: Secondary | ICD-10-CM | POA: Insufficient documentation

## 2013-03-28 DIAGNOSIS — G43909 Migraine, unspecified, not intractable, without status migrainosus: Secondary | ICD-10-CM | POA: Insufficient documentation

## 2013-03-28 LAB — URINE MICROSCOPIC-ADD ON

## 2013-03-28 LAB — URINALYSIS, ROUTINE W REFLEX MICROSCOPIC
Bilirubin Urine: NEGATIVE
Hgb urine dipstick: NEGATIVE
Leukocytes, UA: NEGATIVE
Nitrite: NEGATIVE
Protein, ur: NEGATIVE mg/dL
Specific Gravity, Urine: 1.03 (ref 1.005–1.030)
Urobilinogen, UA: 1 mg/dL (ref 0.0–1.0)
pH: 7 (ref 5.0–8.0)

## 2013-03-28 LAB — CBC WITH DIFFERENTIAL/PLATELET
Basophils Relative: 0 % (ref 0–1)
Eosinophils Absolute: 0.2 10*3/uL (ref 0.0–0.7)
Eosinophils Relative: 1 % (ref 0–5)
Hemoglobin: 11.2 g/dL — ABNORMAL LOW (ref 12.0–15.0)
Lymphs Abs: 2.3 10*3/uL (ref 0.7–4.0)
MCH: 21.3 pg — ABNORMAL LOW (ref 26.0–34.0)
MCHC: 33.7 g/dL (ref 30.0–36.0)
MCV: 63.2 fL — ABNORMAL LOW (ref 78.0–100.0)
Monocytes Absolute: 0.8 10*3/uL (ref 0.1–1.0)
Monocytes Relative: 5 % (ref 3–12)
Neutro Abs: 12.2 10*3/uL — ABNORMAL HIGH (ref 1.7–7.7)
Neutrophils Relative %: 79 % — ABNORMAL HIGH (ref 43–77)
RBC: 5.25 MIL/uL — ABNORMAL HIGH (ref 3.87–5.11)
RDW: 17.7 % — ABNORMAL HIGH (ref 11.5–15.5)

## 2013-03-28 LAB — COMPREHENSIVE METABOLIC PANEL
AST: 13 U/L (ref 0–37)
Alkaline Phosphatase: 75 U/L (ref 39–117)
BUN: 8 mg/dL (ref 6–23)
CO2: 21 mEq/L (ref 19–32)
Calcium: 10.3 mg/dL (ref 8.4–10.5)
Chloride: 100 mEq/L (ref 96–112)
Creatinine, Ser: 0.73 mg/dL (ref 0.50–1.10)
GFR calc Af Amer: >90 mL/min (ref >90)
GFR calc non Af Amer: >90 mL/min (ref >90)
Glucose, Bld: 299 mg/dL — ABNORMAL HIGH (ref 70–99)
Potassium: 3.6 mEq/L (ref 3.5–5.1)
Sodium: 136 mEq/L (ref 135–145)
Total Protein: 8.8 g/dL — ABNORMAL HIGH (ref 6.0–8.3)

## 2013-03-28 LAB — LIPASE, BLOOD: Lipase: 33 U/L (ref 11–59)

## 2013-03-28 LAB — CG4 I-STAT (LACTIC ACID): Lactic Acid, Venous: 2.68 mmol/L — ABNORMAL HIGH (ref 0.5–2.2)

## 2013-03-28 LAB — POCT I-STAT TROPONIN I: Troponin i, poc: 0.01 ng/mL (ref 0.00–0.08)

## 2013-03-28 MED ORDER — ONDANSETRON HCL 4 MG/2ML IJ SOLN
4.0000 mg | Freq: Once | INTRAMUSCULAR | Status: AC
  Administered 2013-03-28: 4 mg via INTRAVENOUS
  Filled 2013-03-28: qty 2

## 2013-03-28 MED ORDER — SODIUM CHLORIDE 0.9 % IV SOLN
1000.0000 mL | Freq: Once | INTRAVENOUS | Status: AC
  Administered 2013-03-28: 1000 mL via INTRAVENOUS

## 2013-03-28 MED ORDER — HYDROMORPHONE HCL PF 1 MG/ML IJ SOLN
1.0000 mg | Freq: Once | INTRAMUSCULAR | Status: AC
  Administered 2013-03-28: 1 mg via INTRAVENOUS
  Filled 2013-03-28: qty 1

## 2013-03-28 MED ORDER — LORAZEPAM 2 MG/ML IJ SOLN
1.0000 mg | Freq: Once | INTRAMUSCULAR | Status: AC
  Administered 2013-03-28: 1 mg via INTRAVENOUS
  Filled 2013-03-28: qty 1

## 2013-03-28 MED ORDER — LORAZEPAM 2 MG/ML IJ SOLN
0.5000 mg | Freq: Once | INTRAMUSCULAR | Status: AC
  Administered 2013-03-28: 0.5 mg via INTRAVENOUS
  Filled 2013-03-28: qty 1

## 2013-03-28 MED ORDER — METOCLOPRAMIDE HCL 5 MG/ML IJ SOLN
10.0000 mg | Freq: Once | INTRAMUSCULAR | Status: AC
  Administered 2013-03-28: 10 mg via INTRAVENOUS
  Filled 2013-03-28: qty 2

## 2013-03-28 MED ORDER — FENTANYL CITRATE 0.05 MG/ML IJ SOLN
50.0000 ug | Freq: Once | INTRAMUSCULAR | Status: AC
  Administered 2013-03-28: 50 ug via INTRAVENOUS
  Filled 2013-03-28: qty 2

## 2013-03-28 MED ORDER — PROMETHAZINE HCL 25 MG PO TABS: 25.0000 mg | ORAL_TABLET | Freq: Four times a day (QID) | ORAL | Status: DC | PRN

## 2013-03-28 NOTE — ED Provider Notes (Signed)
CSN: [id number]     Arrival date & time 03/28/13  1400 History   First MD Initiated Contact with Patient 03/28/13 1457     Chief Complaint  Patient presents with  . Abdominal Pain  . Emesis   (Consider location/radiation/quality/duration/timing/severity/associated sxs/prior Treatment) The history is provided by the patient. No language interpreter was used.  [name] is a 37 y/o F with PMHx of iron deficiency anemia, HTN, HLD, DM, OSA, CAD with PDA angioplasty in 05/2006, cath for re-assessment in 05/2006 and 2010 that noted no restenosis or significant CAD progression, migraines, asthma, gastritis, depression, Hep B, GERD, fibromyalgia, gastroparesis, CVA, abdominal pain presenting to the ED with abdominal pain localized to the LLQ that has been ongoing since this morning. Described the discomfort as a sharp, stabbing pain that does not radiate. Patient reported that the pain worsens with emesis, improves with motion - shaking the patient's legs. Patient reported that this morning, at approximately 1:30AM, stated that she had onset of emesis and nausea. Patient reported that she has had at least more than 10 episodes of emesis today. Reported that at first the emesis was mainly of food, but has now progressed to liquid contents. Patient reported that she has had abdominal pain for "years." Patient reported that she has not taken anything for the pain today. Denied chest pain, shortness of breath, difficulty breathing, chills, fever, diarrhea, melena, hematochezia.  PCP Dr. Bosie Clos  Past Medical History  Diagnosis Date  . Thrombocytosis     Hem/Onc suggested 2/2 chronic hepatits and/or iron deficiency anemia  . Iron deficiency anemia   . N&V (nausea and vomiting)     Chronic. Unclear etiology with multiple admission and ED visits. CT abdomen with and without contrast (02/2011)  showed no acute process. Gastic Emptying scan (01/2010) was normal. Ultrasound of the abdomen was within normal  limits. Hepatitis B viral load was undectable. HIV NR. EGD - gastritis, Hpylori + s/p Rx  . Abdominal pain   . Hypertension   . Hyperlipidemia   . Diabetes mellitus type 2, uncontrolled, with complications   . Recurrent boils   . Back pain   . OSA (obstructive sleep apnea)   . 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 Cath 12/10- no restenosis or significant CAD progression  . Irregular menses     Small ovarian follicles seen on CT(9/06)  . History of pyelonephritis     H/o GrpB Pyelonephritis (9/06) and UTI- 07/11- E.Coli, 12/10- GBS  . Abscess of tunica vaginalis     10/09- Abundant S. aureus- sensitive to all abx  . Obesity   . Asthma   . Gastritis   . Depression   . Hepatitis B, chronic     Hep BeAb+,Hep B cAb+ & Hep BsAg+ (9/06)  . Peripheral neuropathy   . Fibromyalgia   . GERD (gastroesophageal reflux disease)   . Migraine   . Anxiety   . Gastroparesis     secondary to poorly controlled DM, last emptying study performed 01/2010  was normal but may be falsely positive as pt was on reglan  . Blood dyscrasia   . H/O: CVA (cerebrovascular accident)     history of remote right cerebellar infarct noted on head CT at least since 10/2011   Past Surgical History  Procedure Laterality Date  . Cesarean section  1997   Family History  Problem Relation  Age of Onset  . Diabetes Father    History  Substance Use Topics  . Smoking status: Former Smoker    Types: Cigarettes    Quit date: 04/24/1996  . Smokeless tobacco: Never Used     Comment: quit smoking cigarettes age 62  . Alcohol Use: Yes     Comment: occasionally   OB History   Grav Para Term Preterm Abortions TAB SAB Ect Mult Living                 Review of Systems  Constitutional: Negative for fever and chills.  Respiratory: Negative for chest tightness and shortness of breath.   Cardiovascular:  Negative for chest pain.  Gastrointestinal: Positive for nausea, vomiting and abdominal pain. Negative for diarrhea, constipation, blood in stool and anal bleeding.  Musculoskeletal: Negative for neck pain.  Neurological: Negative for weakness.  All other systems reviewed and are negative.    Allergies  Lisinopril  Home Medications   Current Outpatient Rx  Name  Route  Sig  Dispense  Refill  . acetaminophen (TYLENOL) 500 MG tablet   Oral   Take 1,000 mg by mouth every 6 (six) hours as needed for pain.         Marland Kitchen albuterol (PROVENTIL HFA;VENTOLIN HFA) 108 (90 BASE) MCG/ACT inhaler   Inhalation   Inhale 2 puffs into the lungs every 4 (four) hours as needed. For shortness of breath   1 Inhaler   6   . amitriptyline (ELAVIL) 25 MG tablet   Oral   Take 2 tablets (50 mg total) by mouth at bedtime.   60 tablet   3   . calcium carbonate (TUMS - DOSED IN MG ELEMENTAL CALCIUM) 500 MG chewable tablet   Oral   Chew 1 tablet by mouth daily.         . Fluticasone-Salmeterol (ADVAIR) 100-50 MCG/DOSE AEPB   Inhalation   Inhale 1 puff into the lungs every 12 (twelve) hours.   60 each   6   . gabapentin (NEURONTIN) 300 MG capsule      TAKE 1 TABLET BY MOUTH THREE TIMES A DAY   90 capsule   3   . insulin aspart (NOVOLOG) 100 unit/ml SOLN      Check sugar level before meals and inject Novolog as listed below. If sugar 150-199: inject 2 unit;  200-249: 4 units, 250-299: 7 units; 300-349: 10 units; Over 350: 12 units   1 pen   6   . Insulin Glargine (LANTUS SOLOSTAR) 100 UNIT/ML SOPN      INJECT 40 UNITS UNDER THE SKIN EVERY NIGHT AT BEDTIME   3 mL   0     PT WAS PRESCRIBED VIALS FROM ER BUT SHE IS USING T ...   . insulin glargine (LANTUS) 100 UNIT/ML injection   Subcutaneous   Inject 0.02 mLs (2 Units total) into the skin at bedtime. Check sugar levels before meals and inject novolog as sliding scale listed below: If sugar 150-199: inject 2units; 200-249:inject 4 units;  250-299: 7 units; 300-349: 10units; Over 350: 12 units   10 mL   6   . labetalol (NORMODYNE) 100 MG tablet   Oral   Take 1 tablet (100 mg total) by mouth 2 (two) times daily.   60 tablet   1   . omeprazole (PRILOSEC) 40 MG capsule   Oral   Take 1 capsule (40 mg total) by mouth daily.   31 capsule   6   .  pregabalin (LYRICA) 75 MG capsule   Oral   Take 1 capsule (75 mg total) by mouth 2 (two) times daily.   60 capsule   6   . promethazine (PHENERGAN) 25 MG tablet   Oral   Take 1 tablet (25 mg total) by mouth every 6 (six) hours as needed for nausea or vomiting.   30 tablet   0    BP 158/105  Pulse 125  Temp(Src) 98 F (36.7 C) (Oral)  Resp 24  SpO2 100%  LMP 03/08/2013 Physical Exam  Nursing note and vitals reviewed. Constitutional: She is oriented to person, place, and time. She appears well-developed and well-nourished. No distress.  Patient found laying in bed shaking her legs bilaterally - reported that this motion makes her pain feel better  HENT:  Head: Normocephalic and atraumatic.  Mouth/Throat: Oropharynx is clear and moist. No oropharyngeal exudate.  Eyes: Conjunctivae and EOM are normal. Pupils are equal, round, and reactive to light. Right eye exhibits no discharge. Left eye exhibits no discharge.  Neck: Normal range of motion. Neck supple.  Negative neck stiffness   Cardiovascular: Regular rhythm and normal heart sounds.  Exam reveals no friction rub.   No murmur heard. Pulses:      Radial pulses are 2+ on the right side, and 2+ on the left side.  Tachycardia noted upon auscultation (patient shaking her legs during the interview)  Pulmonary/Chest: Effort normal and breath sounds normal. No respiratory distress. She has no wheezes. She has no rales.  Abdominal: Soft. Bowel sounds are normal. She exhibits no distension. There is tenderness. There is no guarding.  Discomfort upon palpation to the LLQ  Negative acute abdomen, negative peritoneal  signs Negative fluid wave, ascites noted  Musculoskeletal: Normal range of motion.  Full ROM to upper and lower extremities bilaterally without difficulty noted.   Lymphadenopathy:    She has no cervical adenopathy.  Neurological: She is alert and oriented to person, place, and time. No cranial nerve deficit. She exhibits normal muscle tone. Coordination normal.  Skin: Skin is warm and dry. No rash noted. She is not diaphoretic. No erythema.  Psychiatric: She has a normal mood and affect. Her behavior is normal. Thought content normal.    ED Course  Procedures (including critical care time)  This provider reviewed the patient's chart. Patient has been seen and assessed numerous times in the ED setting regarding abdominal pain, nausea, and vomiting. Patient has had at least 3 CT scans within the past year with negative findings/explanation for LLQ abdominal pain - last CT scan was in 07/2012. Patient has had 5 DG abdominal plain films with negative findings associated with obstruction. Patient was admitted to the hospital in 05/2012 for abdominal pain, nausea, vomiting - patient was discharged and referred to GI. Patient has not been following GI.  3:34 PM This provider spoke with Dr. Cheri Rous , who at this time did not recommend imaging to be performed. Recommended basic labs and urine. Recommended pain and nausea control, as per physician.   Results for orders placed during the hospital encounter of 03/28/13  GLUCOSE, CAPILLARY      Result Value Range   Glucose-Capillary 285 (*) 70 - 99 mg/dL  CBC WITH DIFFERENTIAL      Result Value Range   WBC 15.5 (*) 4.0 - 10.5 K/uL   RBC 5.25 (*) 3.87 - 5.11 MIL/uL   Hemoglobin 11.2 (*) 12.0 - 15.0 g/dL   HCT 40.9 (*) 81.1 - 91.4 %  MCV 63.2 (*) 78.0 - 100.0 fL   MCH 21.3 (*) 26.0 - 34.0 pg   MCHC 33.7  30.0 - 36.0 g/dL   RDW 91.4 (*) 78.2 - 95.6 %   Platelets 526 (*) 150 - 400 K/uL   Neutrophils Relative % 79 (*) 43 - 77 %   Lymphocytes Relative  15  12 - 46 %   Monocytes Relative 5  3 - 12 %   Eosinophils Relative 1  0 - 5 %   Basophils Relative 0  0 - 1 %   Neutro Abs 12.2 (*) 1.7 - 7.7 K/uL   Lymphs Abs 2.3  0.7 - 4.0 K/uL   Monocytes Absolute 0.8  0.1 - 1.0 K/uL   Eosinophils Absolute 0.2  0.0 - 0.7 K/uL   Basophils Absolute 0.0  0.0 - 0.1 K/uL   RBC Morphology TARGET CELLS    COMPREHENSIVE METABOLIC PANEL      Result Value Range   Sodium 136  135 - 145 mEq/L   Potassium 3.6  3.5 - 5.1 mEq/L   Chloride 100  96 - 112 mEq/L   CO2 21  19 - 32 mEq/L   Glucose, Bld 299 (*) 70 - 99 mg/dL   BUN 8  6 - 23 mg/dL   Creatinine, Ser 2.13  0.50 - 1.10 mg/dL   Calcium 08.6  8.4 - 57.8 mg/dL   Total Protein 8.8 (*) 6.0 - 8.3 g/dL   Albumin 3.7  3.5 - 5.2 g/dL   AST 13  0 - 37 U/L   ALT 6  0 - 35 U/L   Alkaline Phosphatase 75  39 - 117 U/L   Total Bilirubin 0.4  0.3 - 1.2 mg/dL   GFR calc non Af Amer >90  >90 mL/min   GFR calc Af Amer >90  >90 mL/min  LIPASE, BLOOD      Result Value Range   Lipase 33  11 - 59 U/L  CG4 I-STAT (LACTIC ACID)      Result Value Range   Lactic Acid, Venous 2.68 (*) 0.5 - 2.2 mmol/L  POCT I-STAT TROPONIN I      Result Value Range   Troponin i, poc 0.01  0.00 - 0.08 ng/mL   Comment 3             Labs Review Labs Reviewed  GLUCOSE, CAPILLARY - Abnormal; Notable for the following:    Glucose-Capillary 285 (*)    All other components within normal limits  CBC WITH DIFFERENTIAL - Abnormal; Notable for the following:    WBC 15.5 (*)    RBC 5.25 (*)    Hemoglobin 11.2 (*)    HCT 33.2 (*)    MCV 63.2 (*)    MCH 21.3 (*)    RDW 17.7 (*)    Platelets 526 (*)    Neutrophils Relative % 79 (*)    Neutro Abs 12.2 (*)    All other components within normal limits  COMPREHENSIVE METABOLIC PANEL - Abnormal; Notable for the following:    Glucose, Bld 299 (*)    Total Protein 8.8 (*)    All other components within normal limits  CG4 I-STAT (LACTIC ACID) - Abnormal; Notable for the following:    Lactic  Acid, Venous 2.68 (*)    All other components within normal limits  LIPASE, BLOOD  URINALYSIS, ROUTINE W REFLEX MICROSCOPIC  POCT I-STAT TROPONIN I   Imaging Review No results found.  EKG Interpretation  Date/Time:  Friday March 28 2013 15:37:25 EST Ventricular Rate:  119 PR Interval:  118 QRS Duration: 70 QT Interval:  327 QTC Calculation: 460 R Axis:   72 Text Interpretation:  Sinus tachycardia Probable left atrial enlargement Borderline T abnormalities, inferior leads Baseline wander in lead(s) V1 No significant change since last tracing Confirmed by YAO  MD, DAVID [phone]) on 03/28/2013 3:39:32 PM            MDM  No diagnosis found.  Medications  0.9 %  sodium chloride infusion (0 mLs Intravenous Stopped 03/28/13 1556)  ondansetron (ZOFRAN) injection 4 mg (4 mg Intravenous Given 03/28/13 1438)  fentaNYL (SUBLIMAZE) injection 50 mcg (50 mcg Intravenous Given 03/28/13 1438)  LORazepam (ATIVAN) injection 0.5 mg (0.5 mg Intravenous Given 03/28/13 1557)  metoCLOPramide (REGLAN) injection 10 mg (10 mg Intravenous Given 03/28/13 1647)  HYDROmorphone (DILAUDID) injection 1 mg (1 mg Intravenous Given 03/28/13 1647)  LORazepam (ATIVAN) injection 1 mg (1 mg Intravenous Given 03/28/13 1647)   Filed Vitals:   03/28/13 1407 03/28/13 1740  BP: 158/105 140/101  Pulse: 125 104  Temp: 98 F (36.7 C)   TempSrc: Oral   Resp: 24 17  SpO2: 100% 99%    Patient presenting to the ED with LLQ abdominal pain with associated symptoms of nausea and emesis.  Patient has chronic abdominal pain, appears to be localized to the LLQ - as per identified in review of the patient's chart - with association nausea and emesis.  Alert and oriented. Heart rate mildly tachycardic since patient continues to shake legs bilaterally for relief of abdominal pain. Lungs clear to auscultation bilaterally to upper and lower lobes. Patient obese. BS normoactive in all 4 quadrants. Discomfort upon palpation to the  LLQ - soft. Negative acute abdomen, negative peritoneal signs. Non-surgical abdomen upon exam.  EKG negative ischemic findings. Troponin negative elevation. Lipase negative elevation. CBC midl elevated WBC noted of 40.9 - negative leukocytosis noted. CMP with glucose of 299, anion gap of 15.0 mEq/L - patient not currently in DKA. Elevated lactic acid of 2.68 noted - due to dehydration and emesis.  This appears to be a chronic condition that has been ongoing for years. Patient continues not to follow GI. Plan is to control patient's pain, nausea and emesis. Plan to have patient succeed in PO challenge. Discussed case with dr. Haywood Lasso - transfer of care to Dr. Haywood Lasso at change in shift.     Raymon Mutton, PA-C 03/28/13 1759

## 2013-03-28 NOTE — ED Notes (Signed)
Pt c/o LLQ abdominal pain and emesis starting at 0100.  Pain score 10/10.  Hx of same.  Pt's family sts Pt has been seen several times for same complaint and has followed up with a GI doctor, but no one can find anything wrong.  Pt has not taken any medication to resolve symptoms.

## 2013-03-28 NOTE — ED Provider Notes (Signed)
5:40 PM Patient is ambulatory in the hall.  She appears to be feeling much better.  She's had no vomiting several hours.  Tolerate oral fluids.  Discharge the superior oral from follow up with her gastroenterologist.  The patient has a long-standing history of chronic abdominal pain gastroparesis.  I suspect this is the same  Shirley Co, MD 03/28/13 [phone]

## 2013-03-28 NOTE — ED Provider Notes (Signed)
Chronic abdominal pain, vomiting. Last CT 07/2012 - Ct's historically negative. Monitor symptoms - gain control of vomiting - discharge home. Dr. Patria Mane performed re-evaluation and found the patient appropriate for discharge home.   Arnoldo Hooker, PA-C 03/28/13 1914

## 2013-03-28 NOTE — ED Notes (Signed)
Pt. Went to the bathroom with no assist. Pt. State that she didn't feel dizzy or light headed. Nurse was notified.

## 2013-03-29 NOTE — ED Provider Notes (Signed)
Medical screening examination/treatment/procedure(s) were performed by non-physician practitioner and as supervising physician I was immediately available for consultation/collaboration.  EKG Interpretation    Date/Time:  Friday March 28 2013 15:37:25 EST Ventricular Rate:  119 PR Interval:  118 QRS Duration: 70 QT Interval:  327 QTC Calculation: 460 R Axis:   72 Text Interpretation:  Sinus tachycardia Probable left atrial enlargement Borderline T abnormalities, inferior leads Baseline wander in lead(s) V1 No significant change since last tracing Confirmed by YAO  MD, DAVID [phone]) on 03/28/2013 3:39:32 PM              Lyanne Co, MD 03/29/13 0025

## 2013-03-31 ENCOUNTER — Telehealth: Payer: Self-pay | Admitting: *Deleted

## 2013-03-31 LAB — POCT PREGNANCY, URINE: Preg Test, Ur: NEGATIVE

## 2013-03-31 NOTE — Telephone Encounter (Signed)
Fax from [facility] - requesting specific directions and "max" dose of insulin daily. Novolog was lasted refilled 7/14 with directions - repeated directions to pharmacist. Stated pt has not refilled med since 7/14 (FYI). Gave verbal order for max dose of 36 units (Over 350 -12 units; check BS 3 times a day). Thanks

## 2013-04-01 ENCOUNTER — Other Ambulatory Visit: Payer: Self-pay | Admitting: *Deleted

## 2013-04-01 DIAGNOSIS — E119 Type 2 diabetes mellitus without complications: Secondary | ICD-10-CM

## 2013-04-01 MED ORDER — ALBUTEROL SULFATE HFA 108 (90 BASE) MCG/ACT IN AERS: 2.0000 | INHALATION_SPRAY | RESPIRATORY_TRACT | Status: DC | PRN

## 2013-04-01 NOTE — ED Provider Notes (Signed)
Medical screening examination/treatment/procedure(s) were performed by non-physician practitioner and as supervising physician I was immediately available for consultation/collaboration.  EKG Interpretation    Date/Time:  Friday March 28 2013 15:37:25 EST Ventricular Rate:  119 PR Interval:  118 QRS Duration: 70 QT Interval:  327 QTC Calculation: 460 R Axis:   72 Text Interpretation:  Sinus tachycardia Probable left atrial enlargement Borderline T abnormalities, inferior leads Baseline wander in lead(s) V1 No significant change since last tracing Confirmed by YAO  MD, DAVID [phone]) on 03/28/2013 3:39:32 PM              Lyanne Co, MD 04/01/13 1200

## 2013-04-02 IMAGING — CT CT ABD-PELV W/ CM
2 of 4 series · 17 of 46 positions shown, 19 images · IV contrast (CONTRAST)
Comparison: 02/17/2010

CLINICAL DATA: Abdominal pain in the lower left side.  Nausea and
vomiting.  History of H pylori and C-section.  The patient refused
oral contrast.

CT ABDOMEN AND PELVIS WITH CONTRAST
TECHNIQUE: Multidetector CT imaging of the abdomen and pelvis was
performed following the standard protocol during bolus
administration of intravenous contrast.
Contrast: 120 ml [id number]

[Series 2: routine · axial · 0.96mm/px · z∈[-639,-199]mm · 14 of 97 slices shown, 16 images]
[im 5/97  soft-tissue]
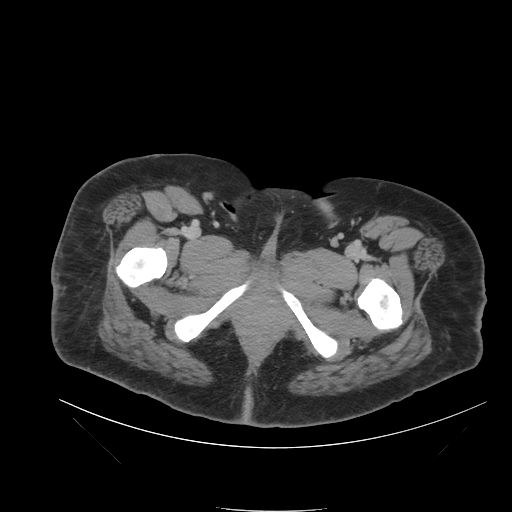
[im 5/97  bone]
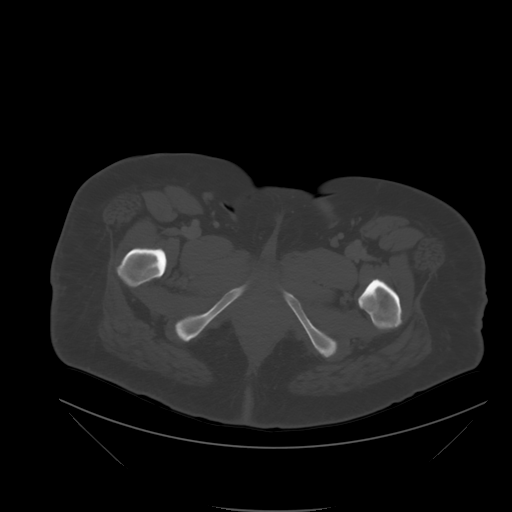
[im 13/97  soft-tissue]
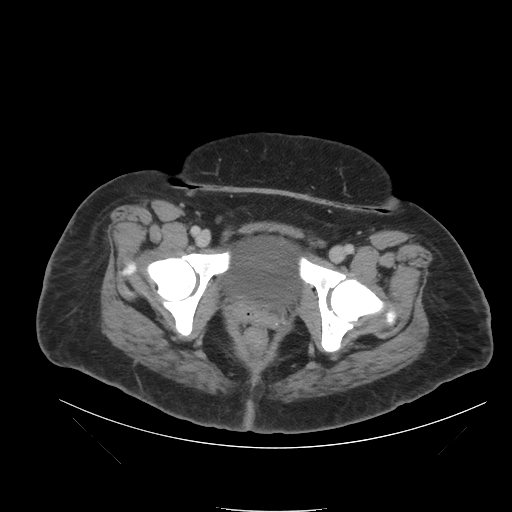
[im 21/97  soft-tissue]
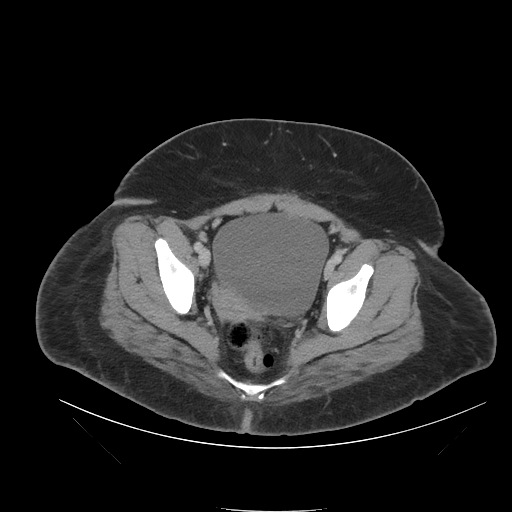
[im 25/97  soft-tissue]
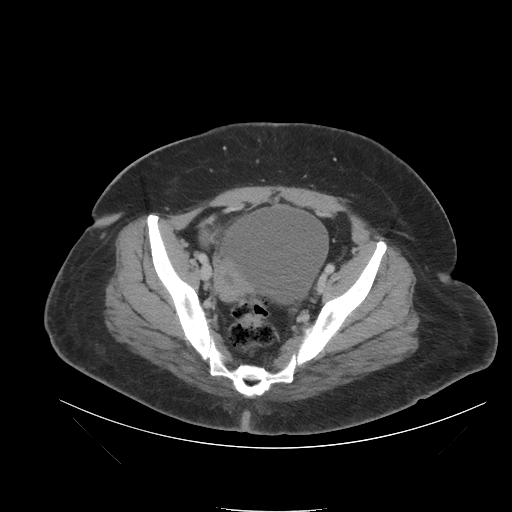
[im 33/97  soft-tissue]
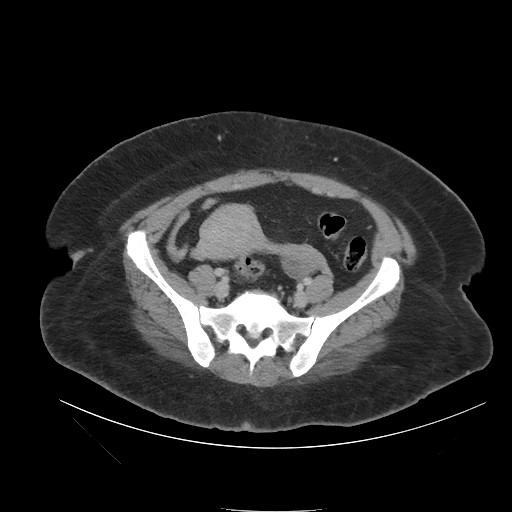
[im 41/97  soft-tissue]
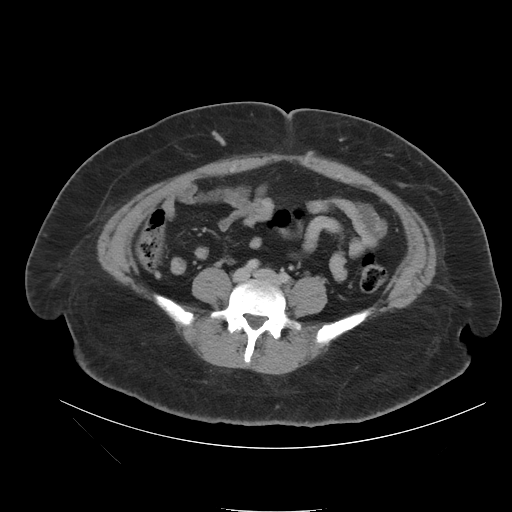
[im 45/97  soft-tissue]
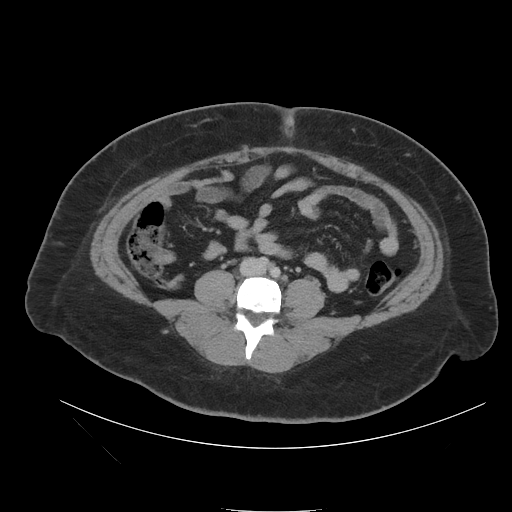
[im 53/97  soft-tissue]
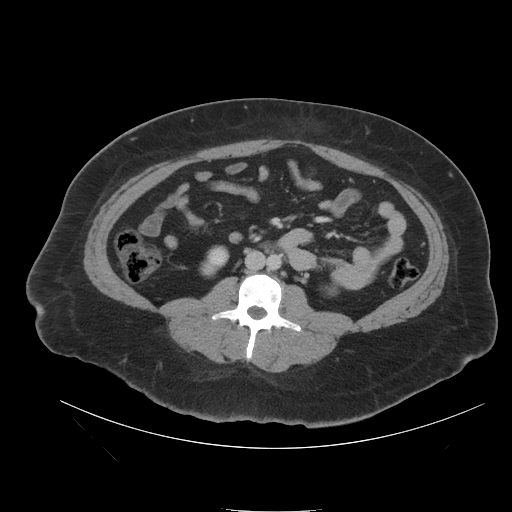
[im 57/97  soft-tissue]
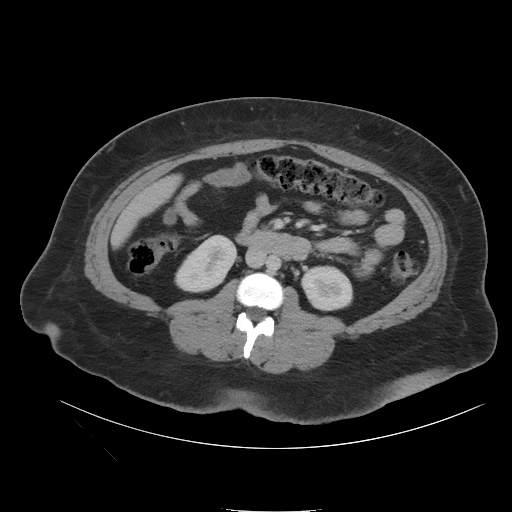
[im 57/97  bone]
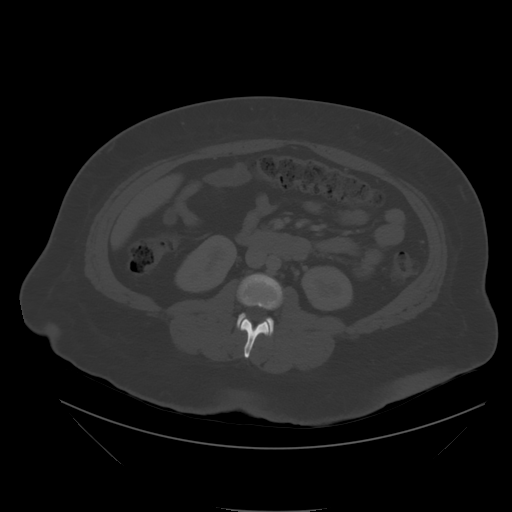
[im 65/97  soft-tissue]
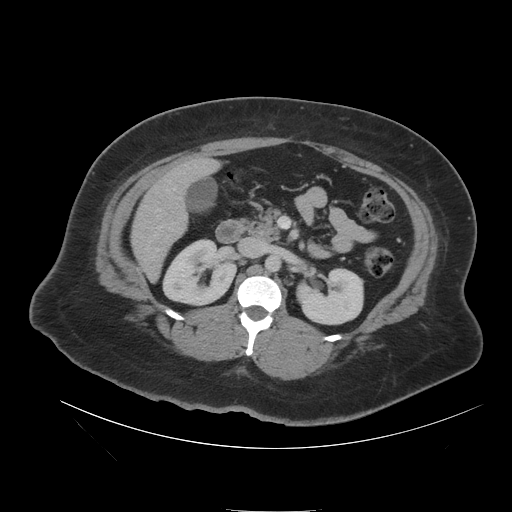
[im 73/97  soft-tissue]
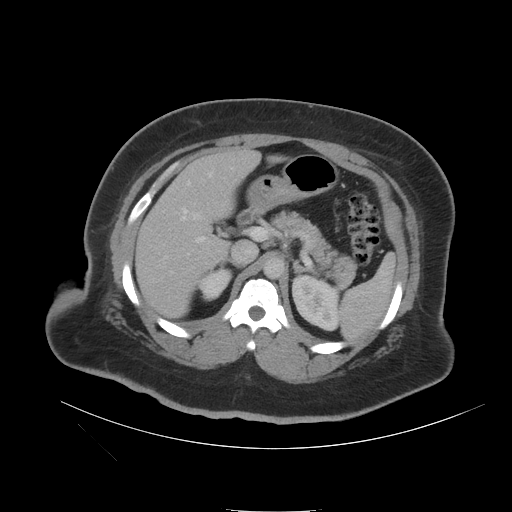
[im 77/97  soft-tissue]
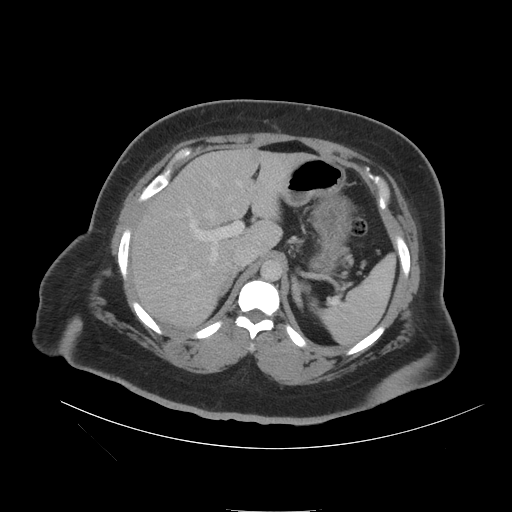
[im 85/97  soft-tissue]
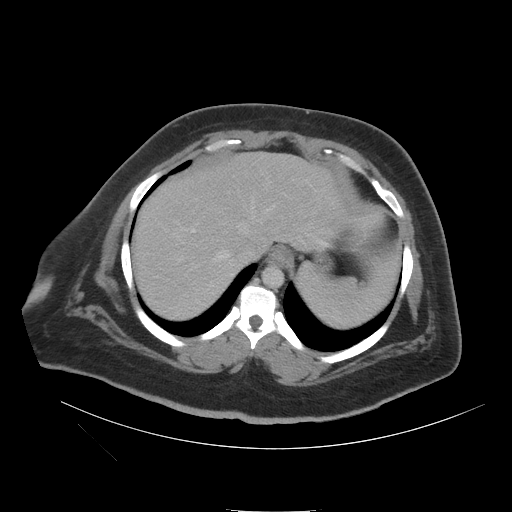
[im 93/97  soft-tissue]
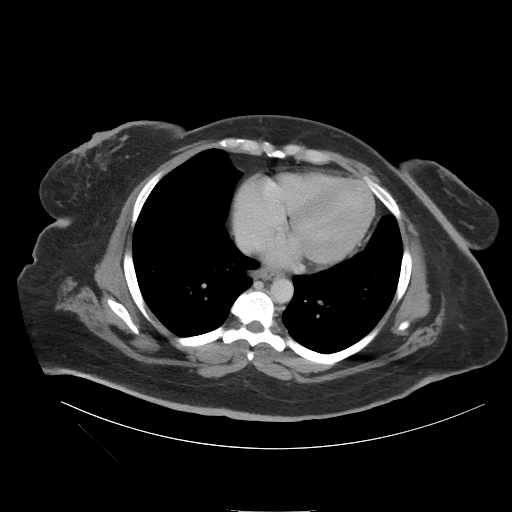

[mpr, coronals, coronal · coronal · 0.96mm/px · 3 of 120 slices shown]
[im 40/120  soft-tissue]
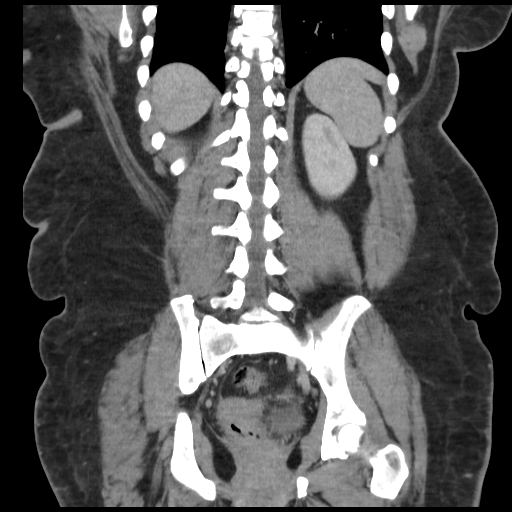
[im 53/120  soft-tissue]
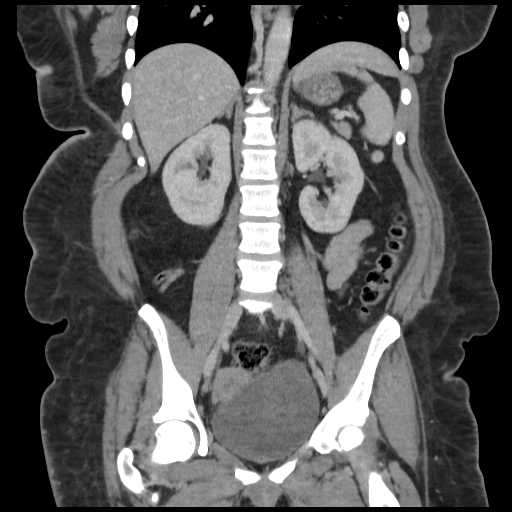
[im 67/120  soft-tissue]
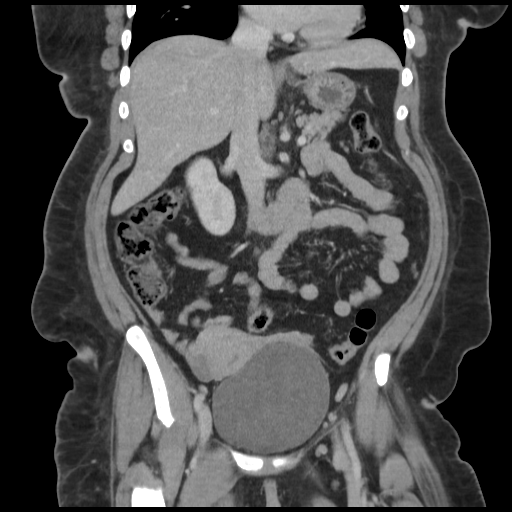

[17 of 46 positions shown; findings below may reference images not displayed]

FINDINGS: Visualized lung bases are clear.  The liver, spleen,
gallbladder, bile ducts, pancreas, adrenal glands, and kidneys are
unremarkable.  Normal caliber abdominal aorta.  Retroperitoneal
lymph nodes are not pathologically enlarged.  No abdominal ascites
or free air.  The stomach is unremarkable for degree of distension.
Normal caliber small bowel.

Pelvis:  The bladder wall is not thickened.  No free or loculated
pelvic fluid collections.  The uterus is anteverted and displaced
towards the right by the pole bladder.  The endometrial stripe
appears to be somewhat thickened and should be correlated with the
menstrual stage.  No abnormal adnexal mass lesions. Diverticulosis
of the sigmoid colon without inflammatory change.  The appendix is
normal.
IMPRESSION: No acute changes demonstrated in the abdomen or pelvis.

## 2013-04-11 ENCOUNTER — Emergency Department (HOSPITAL_COMMUNITY)
Admission: EM | Admit: 2013-04-11 | Discharge: 2013-04-11 | Disposition: A | Discharge status: Home / Self Care | Payer: Medicaid Other | Attending: Emergency Medicine | Admitting: Emergency Medicine

## 2013-04-11 ENCOUNTER — Encounter (HOSPITAL_COMMUNITY): Payer: Self-pay | Admitting: Emergency Medicine

## 2013-04-11 ENCOUNTER — Emergency Department (HOSPITAL_COMMUNITY): Payer: Medicaid Other

## 2013-04-11 DIAGNOSIS — G8929 Other chronic pain: Secondary | ICD-10-CM | POA: Insufficient documentation

## 2013-04-11 DIAGNOSIS — I252 Old myocardial infarction: Secondary | ICD-10-CM | POA: Insufficient documentation

## 2013-04-11 DIAGNOSIS — Z8742 Personal history of other diseases of the female genital tract: Secondary | ICD-10-CM | POA: Insufficient documentation

## 2013-04-11 DIAGNOSIS — K219 Gastro-esophageal reflux disease without esophagitis: Secondary | ICD-10-CM | POA: Insufficient documentation

## 2013-04-11 DIAGNOSIS — F411 Generalized anxiety disorder: Secondary | ICD-10-CM | POA: Insufficient documentation

## 2013-04-11 DIAGNOSIS — Z862 Personal history of diseases of the blood and blood-forming organs and certain disorders involving the immune mechanism: Secondary | ICD-10-CM | POA: Insufficient documentation

## 2013-04-11 DIAGNOSIS — F3289 Other specified depressive episodes: Secondary | ICD-10-CM | POA: Insufficient documentation

## 2013-04-11 DIAGNOSIS — E669 Obesity, unspecified: Secondary | ICD-10-CM | POA: Insufficient documentation

## 2013-04-11 DIAGNOSIS — E785 Hyperlipidemia, unspecified: Secondary | ICD-10-CM | POA: Insufficient documentation

## 2013-04-11 DIAGNOSIS — I2510 Atherosclerotic heart disease of native coronary artery without angina pectoris: Secondary | ICD-10-CM | POA: Insufficient documentation

## 2013-04-11 DIAGNOSIS — J45909 Unspecified asthma, uncomplicated: Secondary | ICD-10-CM | POA: Insufficient documentation

## 2013-04-11 DIAGNOSIS — F329 Major depressive disorder, single episode, unspecified: Secondary | ICD-10-CM | POA: Insufficient documentation

## 2013-04-11 DIAGNOSIS — Z794 Long term (current) use of insulin: Secondary | ICD-10-CM | POA: Insufficient documentation

## 2013-04-11 DIAGNOSIS — IMO0002 Reserved for concepts with insufficient information to code with codable children: Secondary | ICD-10-CM | POA: Insufficient documentation

## 2013-04-11 DIAGNOSIS — G609 Hereditary and idiopathic neuropathy, unspecified: Secondary | ICD-10-CM | POA: Insufficient documentation

## 2013-04-11 DIAGNOSIS — E1149 Type 2 diabetes mellitus with other diabetic neurological complication: Secondary | ICD-10-CM | POA: Insufficient documentation

## 2013-04-11 DIAGNOSIS — K3184 Gastroparesis: Secondary | ICD-10-CM | POA: Insufficient documentation

## 2013-04-11 DIAGNOSIS — Z8619 Personal history of other infectious and parasitic diseases: Secondary | ICD-10-CM | POA: Insufficient documentation

## 2013-04-11 DIAGNOSIS — Z87891 Personal history of nicotine dependence: Secondary | ICD-10-CM | POA: Insufficient documentation

## 2013-04-11 DIAGNOSIS — Z8673 Personal history of transient ischemic attack (TIA), and cerebral infarction without residual deficits: Secondary | ICD-10-CM | POA: Insufficient documentation

## 2013-04-11 DIAGNOSIS — R079 Chest pain, unspecified: Secondary | ICD-10-CM | POA: Insufficient documentation

## 2013-04-11 DIAGNOSIS — G43909 Migraine, unspecified, not intractable, without status migrainosus: Secondary | ICD-10-CM | POA: Insufficient documentation

## 2013-04-11 DIAGNOSIS — Z87448 Personal history of other diseases of urinary system: Secondary | ICD-10-CM | POA: Insufficient documentation

## 2013-04-11 DIAGNOSIS — I10 Essential (primary) hypertension: Secondary | ICD-10-CM | POA: Insufficient documentation

## 2013-04-11 DIAGNOSIS — R1032 Left lower quadrant pain: Secondary | ICD-10-CM | POA: Insufficient documentation

## 2013-04-11 DIAGNOSIS — Z872 Personal history of diseases of the skin and subcutaneous tissue: Secondary | ICD-10-CM | POA: Insufficient documentation

## 2013-04-11 DIAGNOSIS — Z79899 Other long term (current) drug therapy: Secondary | ICD-10-CM | POA: Insufficient documentation

## 2013-04-11 DIAGNOSIS — IMO0001 Reserved for inherently not codable concepts without codable children: Secondary | ICD-10-CM | POA: Insufficient documentation

## 2013-04-11 DIAGNOSIS — Z95818 Presence of other cardiac implants and grafts: Secondary | ICD-10-CM | POA: Insufficient documentation

## 2013-04-11 LAB — COMPREHENSIVE METABOLIC PANEL
AST: 15 U/L (ref 0–37)
BUN: 12 mg/dL (ref 6–23)
CO2: 22 mEq/L (ref 19–32)
Calcium: 9.4 mg/dL (ref 8.4–10.5)
Creatinine, Ser: 0.76 mg/dL (ref 0.50–1.10)
GFR calc non Af Amer: >90 mL/min (ref >90)

## 2013-04-11 LAB — CBC WITH DIFFERENTIAL/PLATELET
Basophils Absolute: 0 10*3/uL (ref 0.0–0.1)
Basophils Relative: 0 % (ref 0–1)
Eosinophils Relative: 1 % (ref 0–5)
HCT: 32.4 % — ABNORMAL LOW (ref 36.0–46.0)
Lymphs Abs: 3.6 10*3/uL (ref 0.7–4.0)
MCH: 21.2 pg — ABNORMAL LOW (ref 26.0–34.0)
MCV: 63.5 fL — ABNORMAL LOW (ref 78.0–100.0)
Monocytes Absolute: 1.3 10*3/uL — ABNORMAL HIGH (ref 0.1–1.0)
Monocytes Relative: 8 % (ref 3–12)
Neutro Abs: 10.7 10*3/uL — ABNORMAL HIGH (ref 1.7–7.7)
Platelets: 555 10*3/uL — ABNORMAL HIGH (ref 150–400)
RBC: 5.1 MIL/uL (ref 3.87–5.11)
RDW: 17.8 % — ABNORMAL HIGH (ref 11.5–15.5)
WBC: 15.8 10*3/uL — ABNORMAL HIGH (ref 4.0–10.5)

## 2013-04-11 LAB — LIPASE, BLOOD: Lipase: 30 U/L (ref 11–59)

## 2013-04-11 MED ORDER — METOCLOPRAMIDE HCL 5 MG/ML IJ SOLN
10.0000 mg | Freq: Once | INTRAMUSCULAR | Status: AC
  Administered 2013-04-11: 10 mg via INTRAVENOUS
  Filled 2013-04-11: qty 2

## 2013-04-11 MED ORDER — ONDANSETRON HCL 4 MG/2ML IJ SOLN
4.0000 mg | Freq: Once | INTRAMUSCULAR | Status: AC
  Administered 2013-04-11: 4 mg via INTRAVENOUS
  Filled 2013-04-11: qty 2

## 2013-04-11 MED ORDER — IOHEXOL 300 MG/ML  SOLN
25.0000 mL | Freq: Once | INTRAMUSCULAR | Status: AC | PRN
  Administered 2013-04-11: 25 mL via ORAL

## 2013-04-11 MED ORDER — LABETALOL HCL 100 MG PO TABS
100.0000 mg | ORAL_TABLET | Freq: Once | ORAL | Status: AC
  Administered 2013-04-11: 100 mg via ORAL
  Filled 2013-04-11: qty 1

## 2013-04-11 MED ORDER — PROMETHAZINE HCL 25 MG/ML IJ SOLN
25.0000 mg | INTRAMUSCULAR | Status: AC
  Administered 2013-04-11: 25 mg via INTRAVENOUS
  Filled 2013-04-11: qty 1

## 2013-04-11 MED ORDER — HYDROMORPHONE HCL PF 1 MG/ML IJ SOLN
0.5000 mg | Freq: Once | INTRAMUSCULAR | Status: AC
  Administered 2013-04-11: 0.5 mg via INTRAVENOUS
  Filled 2013-04-11: qty 1

## 2013-04-11 MED ORDER — SODIUM CHLORIDE 0.9 % IV BOLUS (SEPSIS)
1000.0000 mL | Freq: Once | INTRAVENOUS | Status: AC
  Administered 2013-04-11: 1000 mL via INTRAVENOUS

## 2013-04-11 MED ORDER — PROMETHAZINE HCL 25 MG PO TABS: 25.0000 mg | ORAL_TABLET | Freq: Four times a day (QID) | ORAL | Status: DC | PRN

## 2013-04-11 MED ORDER — MORPHINE SULFATE 4 MG/ML IJ SOLN
4.0000 mg | Freq: Once | INTRAMUSCULAR | Status: AC
  Administered 2013-04-11: 4 mg via INTRAVENOUS
  Filled 2013-04-11: qty 1

## 2013-04-11 MED ORDER — METOCLOPRAMIDE HCL 10 MG PO TABS: 10.0000 mg | ORAL_TABLET | Freq: Four times a day (QID) | ORAL | Status: DC | PRN

## 2013-04-11 MED ORDER — IOHEXOL 300 MG/ML  SOLN
100.0000 mL | Freq: Once | INTRAMUSCULAR | Status: AC | PRN
  Administered 2013-04-11: 100 mL via INTRAVENOUS

## 2013-04-11 MED ORDER — HYDROMORPHONE HCL PF 1 MG/ML IJ SOLN
1.0000 mg | Freq: Once | INTRAMUSCULAR | Status: AC
  Administered 2013-04-11: 1 mg via INTRAVENOUS
  Filled 2013-04-11: qty 1

## 2013-04-11 NOTE — Progress Notes (Signed)
   CARE MANAGEMENT ED NOTE 04/11/2013  Patient:  Shirley [name], Shirley [name]   Account Number:  [id number]  Date Initiated:  04/11/2013  Documentation initiated by:  Radford Pax  Subjective/Objective Assessment:   Patient presents to ED with cc of vomitting ten times last evening.     Subjective/Objective Assessment Detail:     Action/Plan:   Action/Plan Detail:   Anticipated DC Date:       Status Recommendation to Physician:   Result of Recommendation:    Other ED Services  Consult Working Plan    DC Planning Services  CM consult  Other  Medication Assistance    Choice offered to / List presented to:            Status of service:  Completed, signed off  ED Comments:   ED Comments Detail:  [facility] consulted to see paient for medication assistance. EDCM spoke to patient and young female at bedside.  Patient reports she is unemployed and confirms she has [employer].  [facility] provided patient with information reagrding the MAP program, list of financial assistance in the community such as [employer] and local churches, websites needymeds.org and [employer] for medication assistance, [employer], provided patient with RX discount card.  Patient reports she can go to [employer] for computer assistance.  Patient also reports she has mold in her house.  [facility] provided patient with phone number to Department of health.  Patient thankful for resources.

## 2013-04-11 NOTE — ED Notes (Signed)
Pt sts she takes bp med bid, but has not taken it today

## 2013-04-11 NOTE — ED Notes (Signed)
Family at bedside. 

## 2013-04-11 NOTE — ED Provider Notes (Signed)
CSN: [id number]     Arrival date & time 04/11/13  1515 History   First MD Initiated Contact with Patient 04/11/13 1542     Chief Complaint  Patient presents with  . Emesis  . Chest Pain   (Consider location/radiation/quality/duration/timing/severity/associated sxs/prior Treatment) HPI Comments: Patient is a 37 year old female who presents today for nausea, vomiting, and abdominal pain since around 10PM yesterday. She reports her abdominal pain is a sharp pain in her LLQ, worse with vomiting. Emesis is nonbloody. Pt has been unable to take any of her home meds due to vomiting. She reports she has a sharp pain in her chest, worse with palpation and vomiting. This pain feels like her typical chronic abdominal pain. No fevers, chills, diarrhea, suspicious food intake, recent travel.   The history is provided by the patient. No language interpreter was used.    Past Medical History  Diagnosis Date  . Thrombocytosis     Hem/Onc suggested 2/2 chronic hepatits and/or iron deficiency anemia  . Iron deficiency anemia   . N&V (nausea and vomiting)     Chronic. Unclear etiology with multiple admission and ED visits. CT abdomen with and without contrast (02/2011)  showed no acute process. Gastic Emptying scan (01/2010) was normal. Ultrasound of the abdomen was within normal limits. Hepatitis B viral load was undectable. HIV NR. EGD - gastritis, Hpylori + s/p Rx  . Abdominal pain   . Hypertension   . Hyperlipidemia   . Diabetes mellitus type 2, uncontrolled, with complications   . Recurrent boils   . Back pain   . OSA (obstructive sleep apnea)   . 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 Cath 12/10- no restenosis or significant CAD progression  . Irregular menses     Small ovarian follicles seen on CT(9/06)  . History of pyelonephritis     H/o GrpB Pyelonephritis (9/06) and UTI-  07/11- E.Coli, 12/10- GBS  . Abscess of tunica vaginalis     10/09- Abundant S. aureus- sensitive to all abx  . Obesity   . Asthma   . Gastritis   . Depression   . Hepatitis B, chronic     Hep BeAb+,Hep B cAb+ & Hep BsAg+ (9/06)  . Peripheral neuropathy   . Fibromyalgia   . GERD (gastroesophageal reflux disease)   . Migraine   . Anxiety   . Gastroparesis     secondary to poorly controlled DM, last emptying study performed 01/2010  was normal but may be falsely positive as pt was on reglan  . Blood dyscrasia   . H/O: CVA (cerebrovascular accident)     history of remote right cerebellar infarct noted on head CT at least since 10/2011   Past Surgical History  Procedure Laterality Date  . Cesarean section  1997   Family History  Problem Relation Age of Onset  . Diabetes Father    History  Substance Use Topics  . Smoking status: Former Smoker    Types: Cigarettes    Quit date: 04/24/1996  . Smokeless tobacco: Never Used     Comment: quit smoking cigarettes age 76  . Alcohol Use: Yes     Comment: occasionally   OB History   Grav Para Term Preterm Abortions TAB SAB Ect Mult Living                 Review of Systems  Constitutional: Negative for fever and chills.  Respiratory: Negative for shortness of breath.   Cardiovascular: Positive for chest pain.  Gastrointestinal: Positive for nausea, vomiting and abdominal pain. Negative for diarrhea.  All other systems reviewed and are negative.    Allergies  Lisinopril  Home Medications   Current Outpatient Rx  Name  Route  Sig  Dispense  Refill  . acetaminophen (TYLENOL) 500 MG tablet   Oral   Take 1,000 mg by mouth every 6 (six) hours as needed for pain.         Marland Kitchen albuterol (PROVENTIL HFA;VENTOLIN HFA) 108 (90 BASE) MCG/ACT inhaler   Inhalation   Inhale 2 puffs into the lungs every 4 (four) hours as needed. For shortness of breath   1 Inhaler   3   . amitriptyline (ELAVIL) 25 MG tablet   Oral   Take 2  tablets (50 mg total) by mouth at bedtime.   60 tablet   3   . Fluticasone-Salmeterol (ADVAIR) 100-50 MCG/DOSE AEPB   Inhalation   Inhale 1 puff into the lungs every 12 (twelve) hours.   60 each   6   . gabapentin (NEURONTIN) 300 MG capsule      TAKE 1 TABLET BY MOUTH THREE TIMES A DAY   90 capsule   3   . insulin aspart (NOVOLOG) 100 unit/ml SOLN      Check sugar level before meals and inject Novolog as listed below. If sugar 150-199: inject 2 unit;  200-249: 4 units, 250-299: 7 units; 300-349: 10 units; Over 350: 12 units   1 pen   6   . Insulin Glargine (LANTUS SOLOSTAR) 100 UNIT/ML SOPN      INJECT 40 UNITS UNDER THE SKIN EVERY NIGHT AT BEDTIME   3 mL   0     PT WAS PRESCRIBED VIALS FROM ER BUT SHE IS USING T ...   . insulin glargine (LANTUS) 100 UNIT/ML injection   Subcutaneous   Inject 0.02 mLs (2 Units total) into the skin at bedtime. Check sugar levels before meals and inject novolog as sliding scale listed below: If sugar 150-199: inject 2units; 200-249:inject 4 units; 250-299: 7 units; 300-349: 10units; Over 350: 12 units   10 mL   6   . labetalol (NORMODYNE) 100 MG tablet   Oral   Take 1 tablet (100 mg total) by mouth 2 (two) times daily.   60 tablet   1   . omeprazole (PRILOSEC) 40 MG capsule   Oral   Take 1 capsule (40 mg total) by mouth daily.   31 capsule   6   . pregabalin (LYRICA) 75 MG capsule   Oral   Take 1 capsule (75 mg total) by mouth 2 (two) times daily.   60 capsule   6   . promethazine (PHENERGAN) 25 MG tablet   Oral   Take 1 tablet (25 mg total) by mouth every 6 (six) hours as needed for nausea or vomiting.   30 tablet   0   . promethazine (PHENERGAN) 25 MG tablet   Oral   Take 1 tablet (25 mg total) by mouth every 6 (six) hours as needed for nausea or vomiting.   30 tablet   0    BP 146/106  Pulse 102  Temp(Src) 98.5 F (36.9 C) (Oral)  Resp 20  SpO2 100%  LMP 03/01/2013 Physical Exam  Nursing note and vitals  reviewed. Constitutional: She is oriented to person, place, and time. She appears  well-developed and well-nourished. She appears distressed.  HENT:  Head: Normocephalic and atraumatic.  Right Ear: External ear normal.  Left Ear: External ear normal.  Nose: Nose normal.  Mouth/Throat: Oropharynx is clear and moist.  Eyes: Conjunctivae are normal.  Neck: Normal range of motion.  Cardiovascular: Normal rate, regular rhythm, normal heart sounds, intact distal pulses and normal pulses.   Pulmonary/Chest: Effort normal and breath sounds normal. No stridor. No respiratory distress. She has no wheezes. She has no rales. She exhibits tenderness and bony tenderness.  Abdominal: Soft. She exhibits no distension. There is tenderness in the left lower quadrant. There is no rigidity, no rebound and no guarding.  Musculoskeletal: Normal range of motion.  Neurological: She is alert and oriented to person, place, and time. She has normal strength.  Skin: Skin is warm and dry. She is not diaphoretic. No erythema.  Psychiatric: She has a normal mood and affect. Her behavior is normal.    ED Course  Procedures (including critical care time) Labs Review Labs Reviewed  CBC WITH DIFFERENTIAL - Abnormal; Notable for the following:    WBC 15.8 (*)    Hemoglobin 10.8 (*)    HCT 32.4 (*)    MCV 63.5 (*)    MCH 21.2 (*)    RDW 17.8 (*)    Platelets 555 (*)    Neutro Abs 10.7 (*)    Monocytes Absolute 1.3 (*)    All other components within normal limits  COMPREHENSIVE METABOLIC PANEL - Abnormal; Notable for the following:    Glucose, Bld 288 (*)    Albumin 3.4 (*)    All other components within normal limits  LIPASE, BLOOD   Imaging Review Ct Abdomen Pelvis W Contrast  04/11/2013   CLINICAL DATA:  Vomiting  EXAM: CT ABDOMEN AND PELVIS WITH CONTRAST  TECHNIQUE: Multidetector CT imaging of the abdomen and pelvis was performed using the standard protocol following bolus administration of intravenous  contrast.  CONTRAST:  25mL OMNIPAQUE IOHEXOL 300 MG/ML SOLN, OMNIPAQUE IOHEXOL 300 MG/ML SOLN  COMPARISON:  08/15/2012  FINDINGS: Visualized lung bases clear. Unremarkable liver, gallbladder, spleen, adrenal glands, kidneys, pancreas, abdominal aorta, portal vein. Stomach is nondistended. Small bowel and colon are nondilated. Normal appendix. Degenerative disc disease L5-S1. Urinary bladder is incompletely distended and mildly thick-walled. Uterus and adnexal regions unremarkable. No ascites. No free air. No adenopathy localized.  IMPRESSION: 1. No acute abdominal process.   Electronically Signed   By: Oley Balm M.D.   On: 04/11/2013 18:48    EKG Interpretation   None       MDM   1. Chronic abdominal pain    Patient is nontoxic, nonseptic appearing, in no apparent distress.  Patient's pain and other symptoms adequately managed in emergency department.  Fluid bolus given.  Labs, imaging and vitals reviewed.  Patient does not meet the SIRS or Sepsis criteria.  On repeat exam patient does not have a surgical abdomin and there are nor peritoneal signs.  No indication of appendicitis, bowel obstruction, bowel perforation, cholecystitis, diverticulitis, PID or ectopic pregnancy.  Patient discharged home with symptomatic treatment and given strict instructions for follow-up with their primary care physician.  I have also discussed reasons to return immediately to the ER.  Patient expresses understanding and agrees with plan. Dr. Gwendolyn Grant evaluated this patient and agrees with plan.   Patient had intermittent episodes of tachycardia. This appears to be patient's baseline when reviewing her chart from prior visits. This is likely multifactorial as  patient is not compliant with home medications due to financial constraints as well as elevated due to pain. Patient was given home labetalol dose in ED.    Mora Bellman, PA-C 04/12/13 [phone]

## 2013-04-11 NOTE — ED Notes (Addendum)
Per pt starting vomiting last night 10 times-chest pain r/t vomiting-was here 3 weeks ago for same symptoms-does not f/u with GI/Cardio

## 2013-04-12 ENCOUNTER — Inpatient Hospital Stay (HOSPITAL_COMMUNITY)
Admission: EM | Admit: 2013-04-12 | Discharge: 2013-04-15 | DRG: 392 | Disposition: A | Discharge status: Home / Self Care | Payer: Medicaid Other | Attending: Internal Medicine | Admitting: Internal Medicine

## 2013-04-12 ENCOUNTER — Encounter (HOSPITAL_COMMUNITY): Payer: Self-pay | Admitting: Emergency Medicine

## 2013-04-12 DIAGNOSIS — F1910 Other psychoactive substance abuse, uncomplicated: Secondary | ICD-10-CM | POA: Diagnosis present

## 2013-04-12 DIAGNOSIS — D72829 Elevated white blood cell count, unspecified: Secondary | ICD-10-CM

## 2013-04-12 DIAGNOSIS — K219 Gastro-esophageal reflux disease without esophagitis: Secondary | ICD-10-CM | POA: Diagnosis present

## 2013-04-12 DIAGNOSIS — I2510 Atherosclerotic heart disease of native coronary artery without angina pectoris: Secondary | ICD-10-CM | POA: Diagnosis present

## 2013-04-12 DIAGNOSIS — N926 Irregular menstruation, unspecified: Secondary | ICD-10-CM

## 2013-04-12 DIAGNOSIS — I259 Chronic ischemic heart disease, unspecified: Secondary | ICD-10-CM | POA: Diagnosis present

## 2013-04-12 DIAGNOSIS — G43909 Migraine, unspecified, not intractable, without status migrainosus: Secondary | ICD-10-CM

## 2013-04-12 DIAGNOSIS — IMO0002 Reserved for concepts with insufficient information to code with codable children: Secondary | ICD-10-CM | POA: Diagnosis present

## 2013-04-12 DIAGNOSIS — F329 Major depressive disorder, single episode, unspecified: Secondary | ICD-10-CM

## 2013-04-12 DIAGNOSIS — Z8673 Personal history of transient ischemic attack (TIA), and cerebral infarction without residual deficits: Secondary | ICD-10-CM

## 2013-04-12 DIAGNOSIS — Z794 Long term (current) use of insulin: Secondary | ICD-10-CM

## 2013-04-12 DIAGNOSIS — R112 Nausea with vomiting, unspecified: Secondary | ICD-10-CM

## 2013-04-12 DIAGNOSIS — Z598 Other problems related to housing and economic circumstances: Secondary | ICD-10-CM

## 2013-04-12 DIAGNOSIS — I252 Old myocardial infarction: Secondary | ICD-10-CM

## 2013-04-12 DIAGNOSIS — IMO0001 Reserved for inherently not codable concepts without codable children: Secondary | ICD-10-CM | POA: Diagnosis present

## 2013-04-12 DIAGNOSIS — G4733 Obstructive sleep apnea (adult) (pediatric): Secondary | ICD-10-CM | POA: Diagnosis present

## 2013-04-12 DIAGNOSIS — Z87891 Personal history of nicotine dependence: Secondary | ICD-10-CM

## 2013-04-12 DIAGNOSIS — E1165 Type 2 diabetes mellitus with hyperglycemia: Secondary | ICD-10-CM

## 2013-04-12 DIAGNOSIS — E785 Hyperlipidemia, unspecified: Secondary | ICD-10-CM | POA: Diagnosis present

## 2013-04-12 DIAGNOSIS — Z9119 Patient's noncompliance with other medical treatment and regimen: Secondary | ICD-10-CM

## 2013-04-12 DIAGNOSIS — F341 Dysthymic disorder: Secondary | ICD-10-CM | POA: Diagnosis present

## 2013-04-12 DIAGNOSIS — Z5987 Material hardship due to limited financial resources, not elsewhere classified: Secondary | ICD-10-CM

## 2013-04-12 DIAGNOSIS — Z9861 Coronary angioplasty status: Secondary | ICD-10-CM

## 2013-04-12 DIAGNOSIS — Z91199 Patient's noncompliance with other medical treatment and regimen due to unspecified reason: Secondary | ICD-10-CM

## 2013-04-12 DIAGNOSIS — F1410 Cocaine abuse, uncomplicated: Secondary | ICD-10-CM | POA: Diagnosis present

## 2013-04-12 DIAGNOSIS — I10 Essential (primary) hypertension: Secondary | ICD-10-CM | POA: Diagnosis present

## 2013-04-12 DIAGNOSIS — R109 Unspecified abdominal pain: Secondary | ICD-10-CM | POA: Diagnosis present

## 2013-04-12 DIAGNOSIS — J45909 Unspecified asthma, uncomplicated: Secondary | ICD-10-CM | POA: Diagnosis present

## 2013-04-12 DIAGNOSIS — E669 Obesity, unspecified: Secondary | ICD-10-CM | POA: Diagnosis present

## 2013-04-12 DIAGNOSIS — B181 Chronic viral hepatitis B without delta-agent: Secondary | ICD-10-CM | POA: Diagnosis present

## 2013-04-12 DIAGNOSIS — G609 Hereditary and idiopathic neuropathy, unspecified: Secondary | ICD-10-CM | POA: Diagnosis present

## 2013-04-12 DIAGNOSIS — R1032 Left lower quadrant pain: Principal | ICD-10-CM | POA: Diagnosis present

## 2013-04-12 LAB — COMPREHENSIVE METABOLIC PANEL
AST: 17 U/L (ref 0–37)
Albumin: 3.6 g/dL (ref 3.5–5.2)
Alkaline Phosphatase: 70 U/L (ref 39–117)
Calcium: 9.8 mg/dL (ref 8.4–10.5)
Chloride: 99 mEq/L (ref 96–112)
Creatinine, Ser: 0.79 mg/dL (ref 0.50–1.10)
Potassium: 3.8 mEq/L (ref 3.5–5.1)
Sodium: 135 mEq/L (ref 135–145)
Total Bilirubin: 0.4 mg/dL (ref 0.3–1.2)
Total Protein: 8.7 g/dL — ABNORMAL HIGH (ref 6.0–8.3)

## 2013-04-12 LAB — CBC WITH DIFFERENTIAL/PLATELET
Basophils Absolute: 0 10*3/uL (ref 0.0–0.1)
Eosinophils Absolute: 0.3 10*3/uL (ref 0.0–0.7)
Eosinophils Relative: 2 % (ref 0–5)
Lymphocytes Relative: 21 % (ref 12–46)
MCV: 63.5 fL — ABNORMAL LOW (ref 78.0–100.0)
Monocytes Relative: 7 % (ref 3–12)
Neutro Abs: 11.4 10*3/uL — ABNORMAL HIGH (ref 1.7–7.7)
Neutrophils Relative %: 70 % (ref 43–77)
Platelets: 542 10*3/uL — ABNORMAL HIGH (ref 150–400)
RBC: 5.18 MIL/uL — ABNORMAL HIGH (ref 3.87–5.11)
RDW: 18 % — ABNORMAL HIGH (ref 11.5–15.5)
WBC: 16.2 10*3/uL — ABNORMAL HIGH (ref 4.0–10.5)

## 2013-04-12 LAB — LIPASE, BLOOD: Lipase: 77 U/L — ABNORMAL HIGH (ref 11–59)

## 2013-04-12 MED ORDER — LORAZEPAM 2 MG/ML IJ SOLN
1.0000 mg | Freq: Once | INTRAMUSCULAR | Status: AC
  Administered 2013-04-12: 1 mg via INTRAVENOUS
  Filled 2013-04-12: qty 1

## 2013-04-12 MED ORDER — PROMETHAZINE HCL 25 MG/ML IJ SOLN
25.0000 mg | Freq: Once | INTRAMUSCULAR | Status: AC
  Administered 2013-04-12: 25 mg via INTRAVENOUS
  Filled 2013-04-12: qty 1

## 2013-04-12 MED ORDER — SODIUM CHLORIDE 0.9 % IV BOLUS (SEPSIS)
1000.0000 mL | Freq: Once | INTRAVENOUS | Status: AC
  Administered 2013-04-12: 1000 mL via INTRAVENOUS

## 2013-04-12 MED ORDER — HYDROMORPHONE HCL PF 1 MG/ML IJ SOLN
2.0000 mg | Freq: Once | INTRAMUSCULAR | Status: DC
  Filled 2013-04-12: qty 2

## 2013-04-12 MED ORDER — SODIUM CHLORIDE 0.9 % IV SOLN: INTRAVENOUS | Status: DC

## 2013-04-12 MED ORDER — HYDROMORPHONE HCL PF 1 MG/ML IJ SOLN
1.0000 mg | Freq: Once | INTRAMUSCULAR | Status: AC
  Administered 2013-04-12: 1 mg via INTRAVENOUS

## 2013-04-12 NOTE — ED Notes (Signed)
Pt still unable to void at this time 

## 2013-04-12 NOTE — ED Provider Notes (Signed)
Medical screening examination/treatment/procedure(s) were conducted as a shared visit with non-physician practitioner(s) and myself.  I personally evaluated the patient during the encounter.  EKG Interpretation   None        Patient here with abdominal pain. Chronic. On exam, left sided pain without rebound or guarding. Patient's CT without acute findings. Stable for discharge.  Dagmar Hait, MD 04/12/13 [phone]

## 2013-04-12 NOTE — ED Notes (Signed)
Pt presents with abdominal pain that started around 3 pm today. Pt was just seen yesterday for same. Pt reports vomiting with the abdominal pain.

## 2013-04-12 NOTE — ED Provider Notes (Signed)
CSN: [id number]     Arrival date & time 04/12/13  2114 History   First MD Initiated Contact with Patient 04/12/13 2136     Chief Complaint  Patient presents with  . Abdominal Pain  . Emesis   (Consider location/radiation/quality/duration/timing/severity/associated sxs/prior Treatment) Patient is a 37 y.o. female presenting with abdominal pain and vomiting. The history is provided by the patient.  Abdominal Pain Associated symptoms: vomiting   Emesis Associated symptoms: abdominal pain    patient here complaining of diffuse abdominal pain similar to her chronic abdominal pain. Seen here last night for similar symptoms and had an abdominal CT which is negative for any acute process. Multiple visits to the ER for similar symptoms. States he felt better after she left the department last night but then had a piece of pizza and the vomiting started once more. No fever or chills. Her emesis has been nonbilious. No diarrhea. Pain characterized as cramping in her left upper quadrant. No medications used prior to arrival.  Past Medical History  Diagnosis Date  . Thrombocytosis     Hem/Onc suggested 2/2 chronic hepatits and/or iron deficiency anemia  . Iron deficiency anemia   . N&V (nausea and vomiting)     Chronic. Unclear etiology with multiple admission and ED visits. CT abdomen with and without contrast (02/2011)  showed no acute process. Gastic Emptying scan (01/2010) was normal. Ultrasound of the abdomen was within normal limits. Hepatitis B viral load was undectable. HIV NR. EGD - gastritis, Hpylori + s/p Rx  . Abdominal pain   . Hypertension   . Hyperlipidemia   . Diabetes mellitus type 2, uncontrolled, with complications   . Recurrent boils   . Back pain   . OSA (obstructive sleep apnea)   . 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 Cath 12/10- no restenosis or significant  CAD progression  . Irregular menses     Small ovarian follicles seen on CT(9/06)  . History of pyelonephritis     H/o GrpB Pyelonephritis (9/06) and UTI- 07/11- E.Coli, 12/10- GBS  . Abscess of tunica vaginalis     10/09- Abundant S. aureus- sensitive to all abx  . Obesity   . Asthma   . Gastritis   . Depression   . Hepatitis B, chronic     Hep BeAb+,Hep B cAb+ & Hep BsAg+ (9/06)  . Peripheral neuropathy   . Fibromyalgia   . GERD (gastroesophageal reflux disease)   . Migraine   . Anxiety   . Gastroparesis     secondary to poorly controlled DM, last emptying study performed 01/2010  was normal but may be falsely positive as pt was on reglan  . Blood dyscrasia   . H/O: CVA (cerebrovascular accident)     history of remote right cerebellar infarct noted on head CT at least since 10/2011   Past Surgical History  Procedure Laterality Date  . Cesarean section  1997   Family History  Problem Relation Age of Onset  . Diabetes Father    History  Substance Use Topics  . Smoking status: Former Smoker    Types: Cigarettes    Quit date: 04/24/1996  . Smokeless tobacco: Never Used     Comment: quit smoking cigarettes age 42  . Alcohol Use: Yes     Comment: occasionally   OB History   Grav Para Term Preterm Abortions TAB SAB Ect  Mult Living                 Review of Systems  Gastrointestinal: Positive for vomiting and abdominal pain.  All other systems reviewed and are negative.    Allergies  Lisinopril  Home Medications   Current Outpatient Rx  Name  Route  Sig  Dispense  Refill  . acetaminophen (TYLENOL) 500 MG tablet   Oral   Take 1,000 mg by mouth every 6 (six) hours as needed for pain.         Marland Kitchen labetalol (NORMODYNE) 100 MG tablet   Oral   Take 1 tablet (100 mg total) by mouth 2 (two) times daily.   60 tablet   1   . amitriptyline (ELAVIL) 25 MG tablet   Oral   Take 2 tablets (50 mg total) by mouth at bedtime.   60 tablet   3   . calcium carbonate  (TUMS - DOSED IN MG ELEMENTAL CALCIUM) 500 MG chewable tablet   Oral   Chew 1 tablet by mouth daily.         . Fluticasone-Salmeterol (ADVAIR) 100-50 MCG/DOSE AEPB   Inhalation   Inhale 1 puff into the lungs every 12 (twelve) hours.   60 each   6   . gabapentin (NEURONTIN) 300 MG capsule      TAKE 1 TABLET BY MOUTH THREE TIMES A DAY   90 capsule   3   . insulin aspart (NOVOLOG) 100 unit/ml SOLN      Check sugar level before meals and inject Novolog as listed below. If sugar 150-199: inject 2 unit;  200-249: 4 units, 250-299: 7 units; 300-349: 10 units; Over 350: 12 units   1 pen   6   . insulin glargine (LANTUS) 100 UNIT/ML injection   Subcutaneous   Inject 0.02 mLs (2 Units total) into the skin at bedtime. Check sugar levels before meals and inject novolog as sliding scale listed below: If sugar 150-199: inject 2units; 200-249:inject 4 units; 250-299: 7 units; 300-349: 10units; Over 350: 12 units   10 mL   6   . metoCLOPramide (REGLAN) 10 MG tablet   Oral   Take 1 tablet (10 mg total) by mouth every 6 (six) hours as needed for nausea (nausea/headache).   6 tablet   0   . omeprazole (PRILOSEC) 40 MG capsule   Oral   Take 1 capsule (40 mg total) by mouth daily.   31 capsule   6   . pregabalin (LYRICA) 75 MG capsule   Oral   Take 1 capsule (75 mg total) by mouth 2 (two) times daily.   60 capsule   6   . promethazine (PHENERGAN) 25 MG tablet   Oral   Take 1 tablet (25 mg total) by mouth every 6 (six) hours as needed for nausea or vomiting.   12 tablet   0    BP 142/100  Pulse 118  Temp(Src) 98.2 F (36.8 C) (Oral)  Resp 18  SpO2 98%  LMP 03/01/2013 Physical Exam  Nursing note and vitals reviewed. Constitutional: She is oriented to person, place, and time. She appears well-developed and well-nourished.  Non-toxic appearance. No distress.  HENT:  Head: Normocephalic and atraumatic.  Eyes: Conjunctivae, EOM and lids are normal. Pupils are equal, round,  and reactive to light.  Neck: Normal range of motion. Neck supple. No tracheal deviation present. No mass present.  Cardiovascular: Regular rhythm and normal heart sounds.  Tachycardia present.  Exam  reveals no gallop.   No murmur heard. Pulmonary/Chest: Effort normal and breath sounds normal. No stridor. No respiratory distress. She has no decreased breath sounds. She has no wheezes. She has no rhonchi. She has no rales.  Abdominal: Soft. Normal appearance and bowel sounds are normal. She exhibits no distension. There is generalized tenderness. There is no rebound and no CVA tenderness.  Musculoskeletal: Normal range of motion. She exhibits no edema and no tenderness.  Neurological: She is alert and oriented to person, place, and time. She has normal strength. No cranial nerve deficit or sensory deficit. GCS eye subscore is 4. GCS verbal subscore is 5. GCS motor subscore is 6.  Skin: Skin is warm and dry. No abrasion and no rash noted.  Psychiatric: She has a normal mood and affect. Her speech is normal and behavior is normal.    ED Course  Procedures (including critical care time) Labs Review Labs Reviewed  CBC WITH DIFFERENTIAL  COMPREHENSIVE METABOLIC PANEL  LIPASE, BLOOD  URINALYSIS, ROUTINE W REFLEX MICROSCOPIC  PREGNANCY, URINE   Imaging Review Ct Abdomen Pelvis W Contrast  04/11/2013   CLINICAL DATA:  Vomiting  EXAM: CT ABDOMEN AND PELVIS WITH CONTRAST  TECHNIQUE: Multidetector CT imaging of the abdomen and pelvis was performed using the standard protocol following bolus administration of intravenous contrast.  CONTRAST:  25mL OMNIPAQUE IOHEXOL 300 MG/ML SOLN, OMNIPAQUE IOHEXOL 300 MG/ML SOLN  COMPARISON:  08/15/2012  FINDINGS: Visualized lung bases clear. Unremarkable liver, gallbladder, spleen, adrenal glands, kidneys, pancreas, abdominal aorta, portal vein. Stomach is nondistended. Small bowel and colon are nondilated. Normal appendix. Degenerative disc disease L5-S1.  Urinary bladder is incompletely distended and mildly thick-walled. Uterus and adnexal regions unremarkable. No ascites. No free air. No adenopathy localized.  IMPRESSION: 1. No acute abdominal process.   Electronically Signed   By: Oley Balm M.D.   On: 04/11/2013 18:48    EKG Interpretation   None       MDM  No diagnosis found. Patient given IV fluids and pain medication here but her vomiting remains. She will be transferred to [facility] for admission    Toy Baker, MD 04/12/13 2343

## 2013-04-13 LAB — RAPID URINE DRUG SCREEN, HOSP PERFORMED
Amphetamines: NOT DETECTED
Barbiturates: NOT DETECTED
Cocaine: POSITIVE — AB
Tetrahydrocannabinol: NOT DETECTED

## 2013-04-13 LAB — BASIC METABOLIC PANEL
Calcium: 8.9 mg/dL (ref 8.4–10.5)
GFR calc Af Amer: >90 mL/min (ref >90)
GFR calc non Af Amer: >90 mL/min (ref >90)
Sodium: 135 mEq/L (ref 135–145)

## 2013-04-13 LAB — CBC
Platelets: 523 10*3/uL — ABNORMAL HIGH (ref 150–400)
RBC: 5.13 MIL/uL — ABNORMAL HIGH (ref 3.87–5.11)
WBC: 15.1 10*3/uL — ABNORMAL HIGH (ref 4.0–10.5)

## 2013-04-13 LAB — ETHANOL: Alcohol, Ethyl (B): <11 mg/dL (ref 0–11)

## 2013-04-13 LAB — TSH: TSH: 1.271 u[IU]/mL (ref 0.350–4.500)

## 2013-04-13 LAB — URINALYSIS, ROUTINE W REFLEX MICROSCOPIC
Bilirubin Urine: NEGATIVE
Glucose, UA: >1000 mg/dL — AB
Ketones, ur: 40 mg/dL — AB
Nitrite: NEGATIVE
Specific Gravity, Urine: 1.015 (ref 1.005–1.030)
pH: 6.5 (ref 5.0–8.0)

## 2013-04-13 LAB — TROPONIN I
Troponin I: <0.3 ng/mL (ref <0.30)
Troponin I: <0.3 ng/mL (ref <0.30)
Troponin I: <0.3 ng/mL (ref <0.30)

## 2013-04-13 LAB — GLUCOSE, CAPILLARY: Glucose-Capillary: 223 mg/dL — ABNORMAL HIGH (ref 70–99)

## 2013-04-13 LAB — MAGNESIUM: Magnesium: 1.6 mg/dL (ref 1.5–2.5)

## 2013-04-13 IMAGING — CR DG ABDOMEN 2V
2 series · 2 of 2 positions shown · non-contrast
Comparison: 08/27/2009

CLINICAL DATA: Vomiting.  Abdominal pain.

ABDOMEN - 2 VIEW

[w abdomen upright]
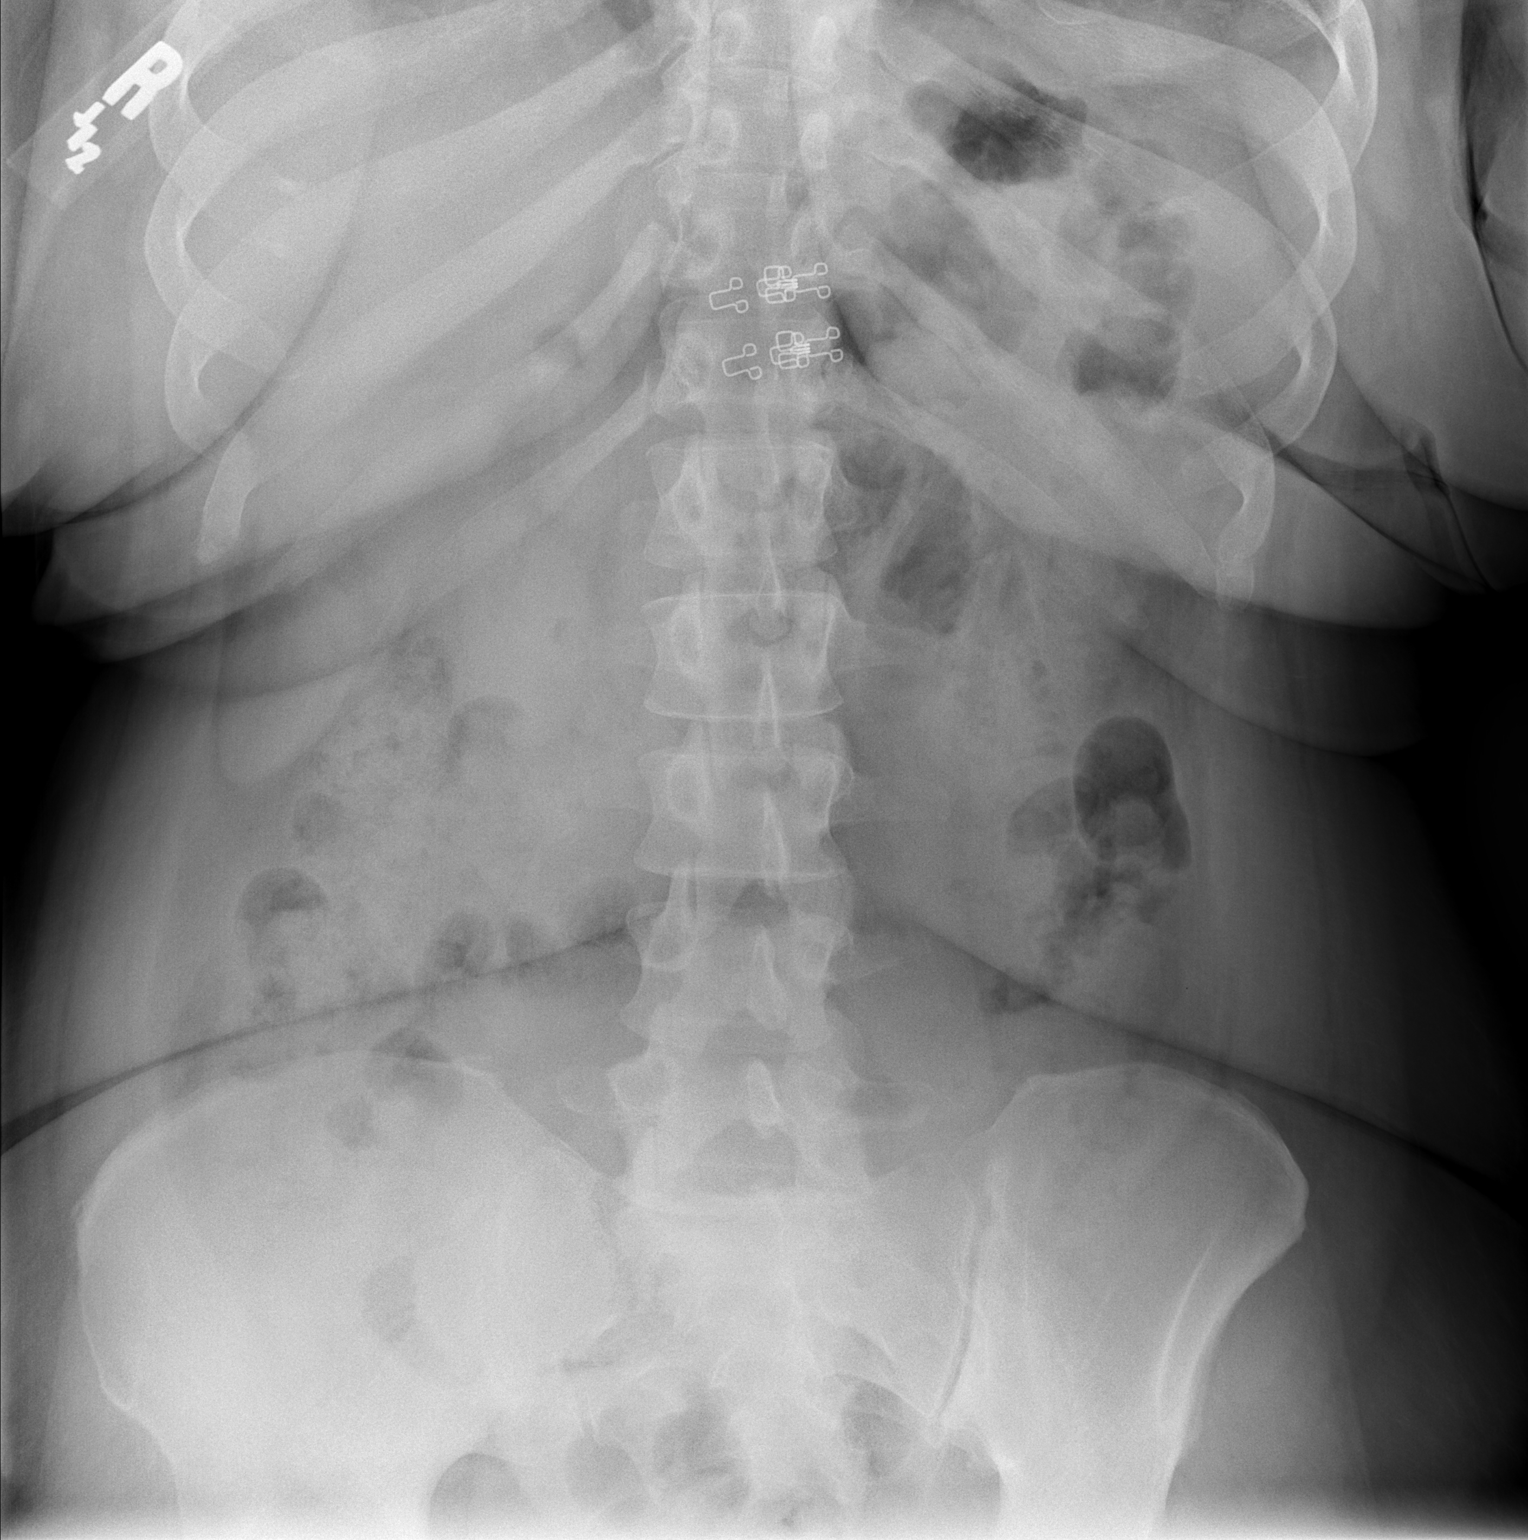

[t abdomen supine]
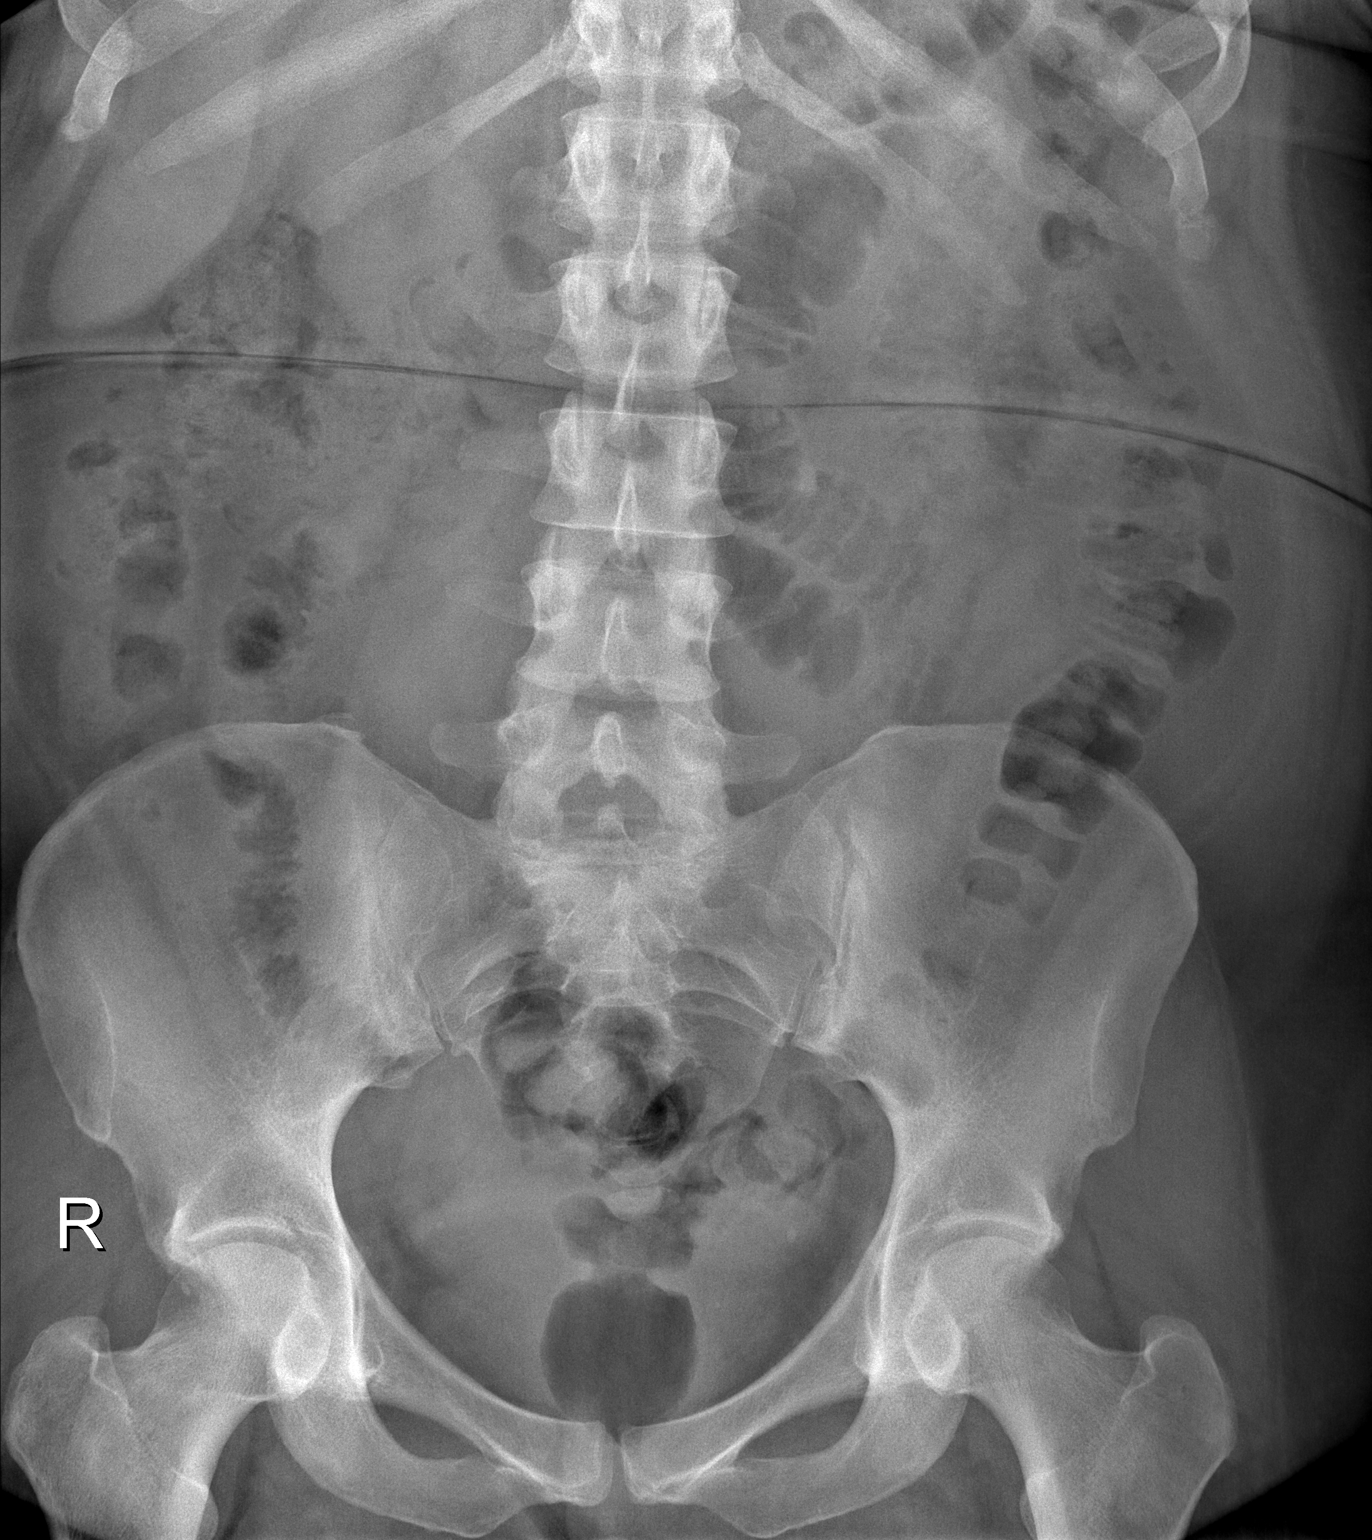

[2 of 2 positions shown; findings below may reference images not displayed]

FINDINGS: There are no abnormally dilated loops.  No abnormal air-
fluid levels.  No worrisome calcifications or bony findings.  No
sign of free air, though the diaphragms are not included on the
upright film.
IMPRESSION: Negative abdominal radiographs.

## 2013-04-13 MED ORDER — ONDANSETRON HCL 4 MG PO TABS: 4.0000 mg | ORAL_TABLET | Freq: Four times a day (QID) | ORAL | Status: DC | PRN

## 2013-04-13 MED ORDER — ONDANSETRON HCL 4 MG/2ML IJ SOLN
4.0000 mg | Freq: Four times a day (QID) | INTRAMUSCULAR | Status: DC
  Administered 2013-04-13 – 2013-04-15 (×8): 4 mg via INTRAVENOUS
  Filled 2013-04-13 (×8): qty 2

## 2013-04-13 MED ORDER — HYDRALAZINE HCL 20 MG/ML IJ SOLN: 5.0000 mg | Freq: Three times a day (TID) | INTRAMUSCULAR | Status: DC | PRN

## 2013-04-13 MED ORDER — HYDROMORPHONE HCL PF 1 MG/ML IJ SOLN
1.0000 mg | Freq: Once | INTRAMUSCULAR | Status: AC
  Administered 2013-04-13: 1 mg via INTRAVENOUS
  Filled 2013-04-13: qty 1

## 2013-04-13 MED ORDER — SODIUM CHLORIDE 0.9 % IJ SOLN: 3.0000 mL | INTRAMUSCULAR | Status: DC | PRN

## 2013-04-13 MED ORDER — SODIUM CHLORIDE 0.9 % IV SOLN
INTRAVENOUS | Status: AC
  Administered 2013-04-13 (×2): via INTRAVENOUS

## 2013-04-13 MED ORDER — MORPHINE SULFATE 4 MG/ML IJ SOLN
4.0000 mg | INTRAMUSCULAR | Status: DC | PRN
  Administered 2013-04-13 – 2013-04-14 (×8): 4 mg via INTRAVENOUS
  Filled 2013-04-13 (×8): qty 1

## 2013-04-13 MED ORDER — HYDRALAZINE HCL 50 MG PO TABS: 50.0000 mg | ORAL_TABLET | Freq: Four times a day (QID) | ORAL | Status: DC

## 2013-04-13 MED ORDER — SODIUM CHLORIDE 0.9 % IJ SOLN
3.0000 mL | Freq: Two times a day (BID) | INTRAMUSCULAR | Status: DC
  Administered 2013-04-13: 3 mL via INTRAVENOUS

## 2013-04-13 MED ORDER — SODIUM CHLORIDE 0.9 % IV SOLN
INTRAVENOUS | Status: DC
  Administered 2013-04-14 – 2013-04-15 (×2): via INTRAVENOUS

## 2013-04-13 MED ORDER — ONDANSETRON HCL 4 MG/2ML IJ SOLN
4.0000 mg | Freq: Four times a day (QID) | INTRAMUSCULAR | Status: DC | PRN
  Filled 2013-04-13: qty 2

## 2013-04-13 MED ORDER — MORPHINE SULFATE 2 MG/ML IJ SOLN
2.0000 mg | INTRAMUSCULAR | Status: DC | PRN
  Administered 2013-04-13 (×2): 2 mg via INTRAVENOUS
  Filled 2013-04-13 (×2): qty 1

## 2013-04-13 MED ORDER — INSULIN GLARGINE 100 UNIT/ML ~~LOC~~ SOLN
10.0000 [IU] | Freq: Every day | SUBCUTANEOUS | Status: DC
  Administered 2013-04-13 – 2013-04-15 (×3): 10 [IU] via SUBCUTANEOUS
  Filled 2013-04-13 (×3): qty 0.1

## 2013-04-13 MED ORDER — ONDANSETRON HCL 4 MG PO TABS
4.0000 mg | ORAL_TABLET | Freq: Four times a day (QID) | ORAL | Status: DC
  Administered 2013-04-15 (×2): 4 mg via ORAL
  Filled 2013-04-13 (×9): qty 1

## 2013-04-13 MED ORDER — SODIUM CHLORIDE 0.9 % IV SOLN: 250.0000 mL | INTRAVENOUS | Status: DC | PRN

## 2013-04-13 MED ORDER — MOMETASONE FURO-FORMOTEROL FUM 100-5 MCG/ACT IN AERO
2.0000 | INHALATION_SPRAY | Freq: Two times a day (BID) | RESPIRATORY_TRACT | Status: DC
  Administered 2013-04-13 – 2013-04-14 (×3): 2 via RESPIRATORY_TRACT
  Filled 2013-04-13 (×2): qty 8.8

## 2013-04-13 MED ORDER — HEPARIN SODIUM (PORCINE) 5000 UNIT/ML IJ SOLN
5000.0000 [IU] | Freq: Three times a day (TID) | INTRAMUSCULAR | Status: DC
  Administered 2013-04-13 – 2013-04-15 (×8): 5000 [IU] via SUBCUTANEOUS
  Filled 2013-04-13 (×10): qty 1

## 2013-04-13 MED ORDER — LABETALOL HCL 100 MG PO TABS
100.0000 mg | ORAL_TABLET | Freq: Two times a day (BID) | ORAL | Status: DC
  Administered 2013-04-13: 100 mg via ORAL
  Filled 2013-04-13: qty 1

## 2013-04-13 MED ORDER — ONDANSETRON HCL 4 MG/2ML IJ SOLN
4.0000 mg | Freq: Once | INTRAMUSCULAR | Status: AC
  Administered 2013-04-13: 4 mg via INTRAVENOUS

## 2013-04-13 MED ORDER — SODIUM CHLORIDE 0.9 % IV SOLN
INTRAVENOUS | Status: AC
  Administered 2013-04-13: 04:00:00 via INTRAVENOUS

## 2013-04-13 NOTE — H&P (Addendum)
  Date: 04/13/2013  Patient name: Shirley [name]  Medical record number: [id number]  Date of birth: 01/29/1976   I have seen and evaluated Shirley [name] and discussed their care with the Residency Team.  Shirley [name] is a 37yo woman with PMH of uncontrolled DM, gastroparesis with chronic intermittent N/V, HTN, HLD, CAD, FM, chronic LLQ pain, who presented with worsening nausea and vomiting.  When seen by the resident team, she only had mild LLQ pain, however, when I saw her she was in 10/10 LLQ pain.  Her N/V started the day prior to admission after eating a large meal of pizza and fatty foods.  Further associated symptom included dizziness during the vomiting but denies recent illness, fever, chills, diarrhea, sick contacts.  She has not taken her medications for gastroparesis (megace, anti-emetics) or DM (insulin) for a few months.  She has been followed in GI and had recommended a GES and EGD/colonoscopy but did not follow through with the studies b/c she did not feel sick when they were scheduled.  She received dilaudid, phenergan and IVF without much relief, however, when I saw her, her symptoms had changed somewhat.   Assessment and Plan: I have seen and evaluated the patient as outlined above. I agree with the formulated Assessment and Plan as detailed in the residents' admission note, with the following changes:   1. Acute on chronic nausea/vomiting/abdominal pain: N/V likely related to h/o gastroparesis (clinical diagnosis, no GES documentation) due to heavy/fatty meal.  She will be NPO and we will schedule zofran IV for at least 24 hours, IVF for any fluid depletion and ongoing emesis, check a UDS, BMP, Mg, CBC, lactate and TSH.  For her acute abdominal pain, will start morphine IV prn.  She does note on questioning changes in the pain if constipated and straining, pain with sexual activity and worsening of the pain with periods.  I am curious if she could have endometriosis or IBS.  Have done  a brief chart review.  She has been in the ED 13 times this year for similar complaints or due to migraines.  She has been admitted twice (this being the second).  I reviewed clinic notes to 2012 (when EMR came online) and there have been attempts to work up GI causes of the pain, however, the patient has not followed up and does not regularly take her medications.      2. Uncontrolled DM with hyperglycemia: Agree with lantus dose and SSI with uptitration based on insulin needs.  She may also have a mild DKA/HHS which would make her N/V worse.   Other issues per resident note.  Consider SW consult for evaluation for difficulty affording home medications.   Shirley Catalina, MD 12/21/201411:54 AM

## 2013-04-13 NOTE — Progress Notes (Signed)
Utilization Review completed.  

## 2013-04-13 NOTE — H&P (Signed)
Date: 04/13/2013               Patient Name:  Shirley [name] MRN: [id number]  DOB: May 01, 1975 Age / Sex: 37 y.o., female   PCP: Shirley Schwartz, MD         Medical Service: Internal Medicine Teaching Service         Attending Physician: Dr. Inez Catalina, MD    First Contact: Dr. Mariea Clonts Pager: [phone]  Second Contact: Dr. Zada Girt Pager: [phone]       After Hours (After 5p/  First Contact Pager: [phone]  weekends / holidays): Second Contact Pager: [phone]   Chief Complaint: nausea/vomiting  History of Present Illness:  Shirley [name] is a 37 year old woman with extensive past medical history including uncontrolled DM2 (A1C 12.3 in 10/2012, >14 previously) c/b gastroparesis with chronic nausea/vomiting, HTN, HL, CAD s/p PTCA, fibromyalgia, chronic LLQ abdominal pain, cocaine abuse, morbid obesity who presents with nausea/vomiting.   Patient states she started feeling nauseated yesterday afternoon while she was shopping at the grocery store and has vomited 4-5 times since, NBNB.  She was seen on both 12/19 and 12/20 in ED for this complaint.  She felt better after leaving ED on 12/19 but ate pizza yesterday then had recurrent nausea/vomiting. She has a history of gastroparesis and states that this feels the same as prior episodes.  She has not had any change in her bowel habits, last BM 3 hours ago (formed, brown, no blood).  She is also experiencing her chronic LLQ abdominal pain/cramping, no change from prior.  Reports dizziness during vomiting episodes but no fever/chills, no diarrhea, no chest pain; no sick contacts.  She is not complaint with any of her medications including insulin, metoclopramide, and anti-emetics.   Patient last saw PCP Shirley [name] in 10/2012.  She has been referred to Shirley [name] of GI multiple times but has not followed-up recently.  Did not have gastric emptying study that was recommended in March and did not schedule EGD/colonoscopy "because she wasn't  sick."  She had a normal gastric emptying study in 2011 (but may have been false negative as patient was reportedly taking metoclopramide at that time).   In ED, patient received dilaudid 1 mg IV, phenergan 25 mg IV, NS 1 L bolus x 2 with Katzenberger relief of her symptoms.  She also had a CT abdomen/pelvis which showed no acute process.   Meds: Current Facility-Administered Medications  Medication Dose Route Frequency Provider Last Rate Last Dose  . 0.9 %  sodium chloride infusion   Intravenous STAT Toy Baker, MD 150 mL/hr at 04/13/13 0334    . 0.9 %  sodium chloride infusion   Intravenous Continuous Na Li, MD      . 0.9 %  sodium chloride infusion  250 mL Intravenous PRN Na Dierdre Searles, MD      . heparin injection 5,000 Units  5,000 Units Subcutaneous Q8H Na Li, MD      . HYDROmorphone (DILAUDID) injection 1 mg  1 mg Intravenous Once Na Li, MD      . insulin glargine (LANTUS) injection 10 Units  10 Units Subcutaneous Daily Na Li, MD      . mometasone-formoterol (DULERA) 100-5 MCG/ACT inhaler 2 puff  2 puff Inhalation BID Na Li, MD      . ondansetron (ZOFRAN) injection 4 mg  4 mg Intravenous Once Shirley Serene, MD      . ondansetron [facility]) tablet 4 mg  4 mg  Oral Q6H PRN Shirley Query, MD       Or  . ondansetron (ZOFRAN) injection 4 mg  4 mg Intravenous Q6H PRN Na Li, MD      . sodium chloride 0.9 % injection 3 mL  3 mL Intravenous Q12H Na Li, MD      . sodium chloride 0.9 % injection 3 mL  3 mL Intravenous PRN Shirley Query, MD        Allergies: Allergies as of 04/12/2013 - Review Complete 04/12/2013  Allergen Reaction Noted  . Lisinopril Nausea Only 03/17/2008   Past Medical History  Diagnosis Date  . Thrombocytosis     Hem/Onc suggested 2/2 chronic hepatits and/or iron deficiency anemia  . Iron deficiency anemia   . N&V (nausea and vomiting)     Chronic. Unclear etiology with multiple admission and ED visits. CT abdomen with and without contrast (02/2011)  showed no acute process. Gastic Emptying scan  (01/2010) was normal. Ultrasound of the abdomen was within normal limits. Hepatitis B viral load was undectable. HIV NR. EGD - gastritis, Hpylori + s/p Rx  . Abdominal pain   . Hypertension   . Hyperlipidemia   . Diabetes mellitus type 2, uncontrolled, with complications   . Recurrent boils   . Back pain   . OSA (obstructive sleep apnea)   . 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 Cath 12/10- no restenosis or significant CAD progression  . Irregular menses     Small ovarian follicles seen on CT(9/06)  . History of pyelonephritis     H/o GrpB Pyelonephritis (9/06) and UTI- 07/11- E.Coli, 12/10- GBS  . Abscess of tunica vaginalis     10/09- Abundant S. aureus- sensitive to all abx  . Obesity   . Asthma   . Gastritis   . Depression   . Hepatitis B, chronic     Hep BeAb+,Hep B cAb+ & Hep BsAg+ (9/06)  . Peripheral neuropathy   . Fibromyalgia   . GERD (gastroesophageal reflux disease)   . Migraine   . Anxiety   . Gastroparesis     secondary to poorly controlled DM, last emptying study performed 01/2010  was normal but may be falsely positive as pt was on reglan  . Blood dyscrasia   . H/O: CVA (cerebrovascular accident)     history of remote right cerebellar infarct noted on head CT at least since 10/2011   Past Surgical History  Procedure Laterality Date  . Cesarean section  1997   Family History  Problem Relation Age of Onset  . Diabetes Father    History   Social History  . Marital Status: Single    Spouse Name: N/A    Number of Children: N/A  . Years of Education: N/A   Occupational History  . other     unemployed   Social History Main Topics  . Smoking status: Former Smoker    Types: Cigarettes    Quit date: 04/24/1996  . Smokeless tobacco: Never Used     Comment: quit smoking cigarettes age 67  . Alcohol Use: Yes     Comment: occasionally  .  Drug Use: No  . Sexual Activity: Yes   Other Topics Concern  . 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          Review of Systems: Review of Systems  Constitutional: Negative for fever, chills, weight loss and diaphoresis.  Respiratory: Negative for cough and shortness of breath.   Cardiovascular: Negative for chest pain, palpitations and leg swelling.  Gastrointestinal: Positive for nausea, vomiting and abdominal pain. Negative for heartburn, diarrhea, constipation, blood in stool and melena.  Genitourinary: Negative for dysuria and frequency.  Musculoskeletal: Negative for back pain and falls.  Neurological: Positive for dizziness. Negative for tingling, loss of consciousness and headaches.    Physical Exam: Blood pressure 178/109, pulse 123, temperature 98.3 F (36.8 C), temperature source Oral, resp. rate 20, last menstrual period 03/01/2013, SpO2 96.00%. General: alert, somewhat cooperative, shaking in bed throughout interview and exam HEENT: NCAT, vision grossly intact, oropharynx clear and non-erythematous  Neck: supple, no lymphadenopathy Lungs: clear to ascultation bilaterally, normal work of respiration, no wheezes, rales, ronchi Heart: tachycardic, regular rate and rhythm, no murmurs, gallops, or rubs Abdomen: mild TTP in epigastric area, LLQ but no rebound or guarding; soft, non-distended, +bowel sounds Extremities: 2+ DP/PT pulses bilaterally, no cyanosis, clubbing, or edema Neurologic: alert & oriented X3, cranial nerves II-XII intact, strength grossly intact, sensation intact to light touch    Lab results: Basic Metabolic Panel:  Recent Labs  16/10/96 1632 04/12/13 2200  NA 135 135  K 3.7 3.8  CL 99 99  CO2 22 25  GLUCOSE 288* 236*  BUN 12 9  CREATININE 0.76 0.79  CALCIUM 9.4 9.8  AG = 11  Liver  Function Tests:  Recent Labs  04/11/13 1632 04/12/13 2200  AST 15 17  ALT 7 7  ALKPHOS 66 70  BILITOT 0.4 0.4  PROT 8.2 8.7*  ALBUMIN 3.4* 3.6    Recent Labs  04/11/13 1632 04/12/13 2200  LIPASE 30 77*   CBC:  Recent Labs  04/11/13 1632 04/12/13 2200  WBC 15.8* 16.2*  NEUTROABS 10.7* 11.4*  HGB 10.8* 10.8*  HCT 32.4* 32.9*  MCV 63.5* 63.5*  PLT 555* 542*   Urinalysis:  Recent Labs  04/13/13 0224  COLORURINE YELLOW  LABSPEC 1.015  PHURINE 6.5  GLUCOSEU >1000*  HGBUR NEGATIVE  BILIRUBINUR NEGATIVE  KETONESUR 40*  PROTEINUR NEGATIVE  UROBILINOGEN 1.0  NITRITE NEGATIVE  LEUKOCYTESUR SMALL*   Imaging results:  Ct Abdomen Pelvis W Contrast  04/11/2013   CLINICAL DATA:  Vomiting  EXAM: CT ABDOMEN AND PELVIS WITH CONTRAST  TECHNIQUE: Multidetector CT imaging of the abdomen and pelvis was performed using the standard protocol following bolus administration of intravenous contrast.  CONTRAST:  25mL OMNIPAQUE IOHEXOL 300 MG/ML SOLN, OMNIPAQUE IOHEXOL 300 MG/ML SOLN  COMPARISON:  08/15/2012  FINDINGS: Visualized lung bases clear. Unremarkable liver, gallbladder, spleen, adrenal glands, kidneys, pancreas, abdominal aorta, portal vein. Stomach is nondistended. Small bowel and colon are nondilated. Normal appendix. Degenerative disc disease L5-S1. Urinary bladder is incompletely distended and mildly thick-walled. Uterus and adnexal regions unremarkable. No ascites. No free air. No adenopathy localized.  IMPRESSION: 1. No acute abdominal process.   Electronically Signed   By: Oley Balm M.D.   On: 04/11/2013 18:48    Assessment & Plan by Problem: #Nausea/vomiting, abdominal pain- Very likely etiology is gastroparesis considering clinical picture with history of gastroparesis and recurrent nausea/vomiting after eating pizza; patient states this feels similar to previous episodes of gastroparesis.   CT abdomen/pelvis with no acute abdominal process. Lipase mildly  elevated at 77 (increased  from 30 on 12/19).  WBC elevated at 16.2 though patient has baseline leucocytosis to ~13.  Complete differential diagnosis is broad and includes gastroenteritis vs. pancreatitis vs. SBO vs. cholecystitis vs. ectopic pregnancy vs. diverticulosis/itis.  Gastroenteritis less likely given that patient is afebrile, does not have diarrhea, no new or unusual foods, no sick contacts.   Pancreatitis less likely though patient has mild elevation in lipase and mild epigastric tenderness; both more likely due to recent vomiting.  SBO very unlikely as patient having 2 BMs daily, last 3 hours ago, + bs, no distension on exam, negative CT abdomen/pelvis.  Cholecystitis unlikely given no RUQ abdominal pain, afebrile.  Ectopic pregnancy unlikely given negative urine pregnancy test.  Diverticulosis/itis possible but less likely given no change in abdominal pain from baseline, no blood per rectum, afebrile, negative CT abdomen/pelvis.  Of note, some concern for pain seeking behavior as patient was resting comfortably in bed upon arrival to room but began shaking back and forth at start of interview.  States that she has been using THC and cocaine though not in a while.  -NPO -NS 75 cc/hr x 12 hours -Zofran 4 mg IV -dilaudid 1 mg IV once -UDS -BMP, Mg, CBC, lactic acid, TSH -orthostatic vitals  #DM2, uncontrolled- A1C 12.3 in 10/2012, BMP glucose 236.  Patient has not been complaint with insulin recently due to financial constraints.  Basal insulin requirement is 25 units daily based on weight nad GFR. Will order Lantus 10 units this morning and could consider adding another 10 units insulin this evening.  Will not give more insulin for now given NPO status and relative insulin naivety due to noncompliance.   -Lantus 10 units at 10a -CBGs q6h  -repeat A1C  #HTN- BP 140s-170s/100s.  Patient not compliant with any of her home medications.  Will not treat for now given shaking on exam and likely  pain component.  -consider starting home BP med(s) later today once nausea and pain better controlled  #CAD- s/p s/p angioplasty (no stent) in 2008, repeat cath in 2010 showed no CAD progression.  No chest pain but will cycle troponins given history and presentation with nausea/vomiting.  Atypical presentation would be more likely in this patient given that she is female and has diabetes.  -EKG -cycle troponins  #DVT PPX- heparin subq TID  #Code status- Full code  Dispo: Disposition is deferred at this time, awaiting improvement of current medical problems. Anticipated discharge in approximately 1-2 day(s).   The patient does have a current PCP Ula Lingo Montey Hora, MD) and does need an [facility] hospital follow-up appointment after discharge.   Signed: Rocco Serene, MD 04/13/2013, 5:05 AM

## 2013-04-13 NOTE — Progress Notes (Signed)
Notified Family Practice on call regarding patients continued n/v in spite of Zofran at 1400, not scheduled to get next dose until 2100.  Order received to administer dose early at 1800.

## 2013-04-14 DIAGNOSIS — N926 Irregular menstruation, unspecified: Secondary | ICD-10-CM

## 2013-04-14 DIAGNOSIS — B181 Chronic viral hepatitis B without delta-agent: Secondary | ICD-10-CM

## 2013-04-14 DIAGNOSIS — R112 Nausea with vomiting, unspecified: Secondary | ICD-10-CM

## 2013-04-14 DIAGNOSIS — G43909 Migraine, unspecified, not intractable, without status migrainosus: Secondary | ICD-10-CM

## 2013-04-14 DIAGNOSIS — E1165 Type 2 diabetes mellitus with hyperglycemia: Secondary | ICD-10-CM

## 2013-04-14 DIAGNOSIS — IMO0002 Reserved for concepts with insufficient information to code with codable children: Secondary | ICD-10-CM

## 2013-04-14 DIAGNOSIS — G4733 Obstructive sleep apnea (adult) (pediatric): Secondary | ICD-10-CM

## 2013-04-14 DIAGNOSIS — R109 Unspecified abdominal pain: Secondary | ICD-10-CM

## 2013-04-14 LAB — GLUCOSE, CAPILLARY
Glucose-Capillary: 188 mg/dL — ABNORMAL HIGH (ref 70–99)
Glucose-Capillary: 196 mg/dL — ABNORMAL HIGH (ref 70–99)
Glucose-Capillary: 204 mg/dL — ABNORMAL HIGH (ref 70–99)
Glucose-Capillary: 360 mg/dL — ABNORMAL HIGH (ref 70–99)

## 2013-04-14 LAB — URINE CULTURE: Colony Count: >=100000

## 2013-04-14 LAB — WET PREP, GENITAL: Trich, Wet Prep: NONE SEEN

## 2013-04-14 LAB — BASIC METABOLIC PANEL
CO2: 22 mEq/L (ref 19–32)
Calcium: 8.8 mg/dL (ref 8.4–10.5)
Chloride: 101 mEq/L (ref 96–112)
Creatinine, Ser: 0.78 mg/dL (ref 0.50–1.10)
Glucose, Bld: 204 mg/dL — ABNORMAL HIGH (ref 70–99)
Potassium: 3.4 mEq/L — ABNORMAL LOW (ref 3.5–5.1)
Sodium: 135 mEq/L (ref 135–145)

## 2013-04-14 LAB — HEMOGLOBIN A1C: Mean Plasma Glucose: 338 mg/dL — ABNORMAL HIGH (ref <117)

## 2013-04-14 MED ORDER — INSULIN ASPART 100 UNIT/ML ~~LOC~~ SOLN
0.0000 [IU] | SUBCUTANEOUS | Status: DC
  Administered 2013-04-14 (×2): 3 [IU] via SUBCUTANEOUS
  Administered 2013-04-14: 15 [IU] via SUBCUTANEOUS
  Administered 2013-04-14: 8 [IU] via SUBCUTANEOUS
  Administered 2013-04-15: 3 [IU] via SUBCUTANEOUS
  Administered 2013-04-15 (×3): 8 [IU] via SUBCUTANEOUS
  Administered 2013-04-15: 5 [IU] via SUBCUTANEOUS

## 2013-04-14 MED ORDER — HYDROCODONE-ACETAMINOPHEN 7.5-325 MG PO TABS
1.0000 | ORAL_TABLET | ORAL | Status: DC | PRN
  Administered 2013-04-14 – 2013-04-15 (×4): 1 via ORAL
  Filled 2013-04-14 (×4): qty 1

## 2013-04-14 MED ORDER — AMPICILLIN 500 MG PO CAPS
500.0000 mg | ORAL_CAPSULE | Freq: Four times a day (QID) | ORAL | Status: DC
  Administered 2013-04-14 – 2013-04-15 (×5): 500 mg via ORAL
  Filled 2013-04-14 (×7): qty 1

## 2013-04-14 MED ORDER — POTASSIUM CHLORIDE CRYS ER 20 MEQ PO TBCR
40.0000 meq | EXTENDED_RELEASE_TABLET | Freq: Once | ORAL | Status: AC
  Administered 2013-04-14: 40 meq via ORAL
  Filled 2013-04-14: qty 2

## 2013-04-14 NOTE — Progress Notes (Signed)
Internal Medicine Attending  Date: 04/14/2013  Patient name: Shirley [name] Medical record number: [id number] Date of birth: 06-16-1975 Age: 37 y.o. Gender: female  I saw and evaluated the patient and discussed her care on a.m. rounds with house staff.  She still reports nausea and vomiting once overnight, and complains of left lower quadrant pain.  Exam is notable for left mid abdominal and lower quadrant tenderness.  Her recurrent nausea and vomiting may be due to gastritis or peptic ulcer disease, and if it persists then GI consult is warranted.  Her last EGD was apparently in 2011.  The cause of her left lower quadrant pain is not clear; abdominal CT scan was unrevealing; colonoscopy may be appropriate if the pain persists.

## 2013-04-14 NOTE — Progress Notes (Addendum)
Subjective: Still has persistent pain in LLQ. Last Vomited yesterday evening. Moving bowel. No fever.  Objective: Vital signs in last 24 hours: Filed Vitals:   04/13/13 2106 04/14/13 0429 04/14/13 0533 04/14/13 1340  BP: 115/70  115/65 146/90  Pulse: 104  94 99  Temp: 98.5 F (36.9 C)  98.2 F (36.8 C) 98.2 F (36.8 C)  TempSrc: Oral   Oral  Resp: 19  18 17   Height:      Weight:  212 lb 8.4 oz (96.4 kg)    SpO2: 99%  98% 100%   Weight change: -3 lb 7.6 oz (-1.577 kg)  Intake/Output Summary (Last 24 hours) at 04/14/13 1538 Last data filed at 04/14/13 0534  Gross per 24 hour  Intake      0 ml  Output   1800 ml  Net  -1800 ml   General appearance: alert, cooperative, appears stated age and no distress Lungs: clear to auscultation bilaterally Heart: regular rate and rhythm, S1, S2 normal, no murmur, click, rub or gallop Abdomen: soft, tenderness on palpation of LLQ, no rebound or guarding, no palpable masses or organomegaly, bowel sounds present. Pelvic: normal appearing vulva, no erythema or masses, no skin tags, normal appearing vaginal mucosa on speculum examination, cervix central, no erthema or lesions on cervix. Bimanual exam- ovaries not palpable due to pts body habitus,no appreciable adnexa masses, increased tenderness in the LLQ on bimanual exam, also some mild tenderness in RLQ. Extremities: No pedal edema, pulse present-2+- DP. Lab Results: Basic Metabolic Panel:  Recent Labs Lab 04/13/13 0700 04/14/13 0657  NA 135 135  K 4.0 3.4*  CL 99 101  CO2 23 22  GLUCOSE 244* 204*  BUN 7 13  CREATININE 0.61 0.78  CALCIUM 8.9 8.8  MG 1.6  --    Liver Function Tests:  Recent Labs Lab 04/11/13 1632 04/12/13 2200  AST 15 17  ALT 7 7  ALKPHOS 66 70  BILITOT 0.4 0.4  PROT 8.2 8.7*  ALBUMIN 3.4* 3.6    Recent Labs Lab 04/11/13 1632 04/12/13 2200  LIPASE 30 77*   No results found for this basename: AMMONIA,  in the last 168 hours CBC:  Recent  Labs Lab 04/11/13 1632 04/12/13 2200 04/13/13 0700  WBC 15.8* 16.2* 15.1*  NEUTROABS 10.7* 11.4*  --   HGB 10.8* 10.8* 11.0*  HCT 32.4* 32.9* 33.4*  MCV 63.5* 63.5* 65.1*  PLT 555* 542* 523*   Cardiac Enzymes:  Recent Labs Lab 04/13/13 0700 04/13/13 1055 04/13/13 1650  TROPONINI <0.30 <0.30 <0.30   CBG:  Recent Labs Lab 04/13/13 1153 04/13/13 1707 04/14/13 0008 04/14/13 0610 04/14/13 0910 04/14/13 1233  GLUCAP 229* 198* 196* 204* 188* 291*   Hemoglobin A1C:  Recent Labs Lab 04/13/13 0700  HGBA1C 13.4*   Thyroid Function Tests:  Recent Labs Lab 04/13/13 0700  TSH 1.271   Urine Drug Screen: Drugs of Abuse     Component Value Date/Time   LABOPIA NONE DETECTED 04/13/2013 1700   LABOPIA NEGATIVE 02/21/2010 2201   COCAINSCRNUR POSITIVE* 04/13/2013 1700   COCAINSCRNUR NEGATIVE 02/21/2010 2201   LABBENZ NONE DETECTED 04/13/2013 1700   LABBENZ NEGATIVE 02/21/2010 2201   AMPHETMU NONE DETECTED 04/13/2013 1700   AMPHETMU NEGATIVE 02/21/2010 2201   THCU NONE DETECTED 04/13/2013 1700   LABBARB NONE DETECTED 04/13/2013 1700    Alcohol Level:  Recent Labs Lab 04/13/13 0700  ETH <11   Urinalysis:  Recent Labs Lab 04/13/13 0224  COLORURINE YELLOW  LABSPEC  1.015  PHURINE 6.5  GLUCOSEU >1000*  HGBUR NEGATIVE  BILIRUBINUR NEGATIVE  KETONESUR 40*  PROTEINUR NEGATIVE  UROBILINOGEN 1.0  NITRITE NEGATIVE  LEUKOCYTESUR SMALL*   Medications: I have reviewed the patient's current medications. Scheduled Meds: . heparin  5,000 Units Subcutaneous Q8H  . insulin aspart  0-15 Units Subcutaneous Q4H  . insulin glargine  10 Units Subcutaneous Daily  . mometasone-formoterol  2 puff Inhalation BID  . ondansetron  4 mg Oral Q6H   Or  . ondansetron (ZOFRAN) IV  4 mg Intravenous Q6H  . sodium chloride  3 mL Intravenous Q12H   Continuous Infusions: . sodium chloride 50 mL/hr at 04/14/13 0552   PRN Meds:.sodium chloride, hydrALAZINE, morphine injection,  sodium chloride Assessment/Plan: Principal Problem:   Abdominal pain, left lower quadrant Active Problems:   ANXIETY DEPRESSION   SUBSTANCE ABUSE, MULTIPLE   HYPERTENSION   CORONARY ARTERY DISEASE, S/P PTCA   NAUSEA AND VOMITING   Chronic hepatitis B   Diabetes mellitus type 2, uncontrolled, with complications   Abdominal pain  Abdominal Pain- LLQ, with nausea and vomiting- Present over the past year. Constant, but waxes and wanes in severity. Pt afebrile, but has a leukocytosis, which appears to be present at baseline. CBC- 04/13/2013- 15.1. Unlikely diverticulitis in the absence of fever, and considering chronicity of pain. Last  Abd Ct done in the Ed 04/11/2013 showed no abnormalities that would explain pain. Pt has missed appointments scheduled to follow up with Gi for complaints. Patient UDS was positive for cocaine, consider vasoconstriction as cause of pain. Also consider gastroparesis for cause of nausea and vomiting, patient says she is nauseous and starts vomiting after eating. She had a normal gastric emptying study in 2011 (but may have been false negative as patient was reportedly taking metoclopramide at that time).  - GI consulted today. - Pelvic Exam today. - If still no explanatory cause, after GI recs, consider chronic pelvic pain vs chronic pain syndrome Vs endometriosis. - Advance diet as tolerated. - Urine culture- >100 000 colonies, positive for Group b strep. Wil commence antibiotic therapy with Ampicillin 500mg  Q6H for 3 days. - GC/chlamydia, and wet prep on endocervical specimen done today.  #DM2, uncontrolled- A1C 12.3 in 10/2012, BMP glucose 236. Patient has not been complaint with insulin recently due to financial constraints. Basal insulin requirement is 25 units daily based on weight nad GFR.  -Lantus 10 units daily - SSI- Mod -CBGs q6h  -repeat A1C-13.4   #HTN- Home meds- Labtalol- 100mg  BID - Continue on admisssion.  #CAD- s/p s/p angioplasty (no stent)  in 2008, repeat cath in 2010 showed no CAD progression. No chest pain but cycle troponins given history and presentation with nausea/vomiting. Trops X3- Neg. Atypical presentation would be more likely in this patient given that she is female and has diabetes.   #DVT PPX- heparin subq TID   #Code status- Full code   Dispo: Disposition is deferred at this time, awaiting improvement of current medical problems.  Anticipated discharge in approximately 1-2 day(s).   The patient does have a current PCP Ula Lingo Montey Hora, MD) and does need an [facility] hospital follow-up appointment after discharge.  The patient does not know have transportation limitations that hinder transportation to clinic appointments.  .Services Needed at time of discharge: Y = Yes, Blank = No PT:   OT:   RN:   Equipment:   Other:     LOS: 2 days   Kennis Carina, MD 04/14/2013, 3:38 PM

## 2013-04-14 NOTE — Progress Notes (Signed)
Notified Dr. Mariea Clonts that pelvic cart had arrived to patient's room.  Also notified MD that patient is requesting benadryl secondary to itching from morphine and that the patient states she ate a large serving of fries from [employer] this afternoon; patient verbalizes understanding of her clear liquid diet restrictions.

## 2013-04-15 DIAGNOSIS — I259 Chronic ischemic heart disease, unspecified: Secondary | ICD-10-CM

## 2013-04-15 DIAGNOSIS — E119 Type 2 diabetes mellitus without complications: Secondary | ICD-10-CM

## 2013-04-15 DIAGNOSIS — D72829 Elevated white blood cell count, unspecified: Secondary | ICD-10-CM

## 2013-04-15 DIAGNOSIS — F3289 Other specified depressive episodes: Secondary | ICD-10-CM

## 2013-04-15 DIAGNOSIS — R109 Unspecified abdominal pain: Secondary | ICD-10-CM

## 2013-04-15 DIAGNOSIS — F329 Major depressive disorder, single episode, unspecified: Secondary | ICD-10-CM

## 2013-04-15 LAB — CBC WITH DIFFERENTIAL/PLATELET
Basophils Absolute: 0 10*3/uL (ref 0.0–0.1)
Basophils Relative: 0 % (ref 0–1)
Eosinophils Absolute: 0.2 10*3/uL (ref 0.0–0.7)
HCT: 29.3 % — ABNORMAL LOW (ref 36.0–46.0)
Hemoglobin: 9.4 g/dL — ABNORMAL LOW (ref 12.0–15.0)
Lymphs Abs: 3.6 10*3/uL (ref 0.7–4.0)
MCH: 21.2 pg — ABNORMAL LOW (ref 26.0–34.0)
MCHC: 32.1 g/dL (ref 30.0–36.0)
MCV: 66 fL — ABNORMAL LOW (ref 78.0–100.0)
Monocytes Absolute: 0.8 10*3/uL (ref 0.1–1.0)
Monocytes Relative: 9 % (ref 3–12)
Neutro Abs: 4.8 10*3/uL (ref 1.7–7.7)
Platelets: 408 10*3/uL — ABNORMAL HIGH (ref 150–400)
RDW: 18 % — ABNORMAL HIGH (ref 11.5–15.5)

## 2013-04-15 LAB — GC/CHLAMYDIA PROBE AMP
CT Probe RNA: NEGATIVE
GC Probe RNA: NEGATIVE

## 2013-04-15 LAB — GLUCOSE, CAPILLARY
Glucose-Capillary: 204 mg/dL — ABNORMAL HIGH (ref 70–99)
Glucose-Capillary: 249 mg/dL — ABNORMAL HIGH (ref 70–99)
Glucose-Capillary: 266 mg/dL — ABNORMAL HIGH (ref 70–99)

## 2013-04-15 MED ORDER — OXYCODONE-ACETAMINOPHEN 5-325 MG PO TABS
1.0000 | ORAL_TABLET | Freq: Four times a day (QID) | ORAL | Status: DC | PRN
  Administered 2013-04-15: 2 via ORAL
  Filled 2013-04-15: qty 2

## 2013-04-15 MED ORDER — OXYCODONE-ACETAMINOPHEN 5-325 MG PO TABS: 1.0000 | ORAL_TABLET | Freq: Four times a day (QID) | ORAL | Status: DC | PRN

## 2013-04-15 MED ORDER — AMPICILLIN 500 MG PO CAPS: 500.0000 mg | ORAL_CAPSULE | Freq: Four times a day (QID) | ORAL | Status: AC

## 2013-04-15 MED ORDER — AMLODIPINE BESYLATE 5 MG PO TABS: 5.0000 mg | ORAL_TABLET | Freq: Every day | ORAL | Status: DC

## 2013-04-15 MED ORDER — PROMETHAZINE HCL 25 MG PO TABS: 25.0000 mg | ORAL_TABLET | Freq: Three times a day (TID) | ORAL | Status: DC | PRN

## 2013-04-15 MED ORDER — PROMETHAZINE HCL 25 MG PO TABS: 25.0000 mg | ORAL_TABLET | Freq: Four times a day (QID) | ORAL | Status: DC | PRN

## 2013-04-15 NOTE — Progress Notes (Signed)
Subjective: Feels better today. Ate fries brought in by a friend yesterday. Said she didn't know she wasn't supposed to start taking that yet. Still has LLQ pain.   Objective: Vital signs in last 24 hours: Filed Vitals:   04/14/13 0533 04/14/13 1340 04/14/13 2105 04/15/13 0622  BP: 115/65 146/90 133/83 134/81  Pulse: 94 99 99 101  Temp: 98.2 F (36.8 C) 98.2 F (36.8 C) 98.5 F (36.9 C) 98.3 F (36.8 C)  TempSrc:  Oral Oral Oral  Resp: 18 17 18 18   Height:      Weight:      SpO2: 98% 100% 100% 100%   Weight change:   Intake/Output Summary (Last 24 hours) at 04/15/13 1219 Last data filed at 04/14/13 2025  Gross per 24 hour  Intake      0 ml  Output    400 ml  Net   -400 ml   General appearance: lying in bed comfortably, not in any distress. Lungs: No added sounds. Heart:  S1, S2 normal, no murmur, click, rub or gallop Abdomen: soft, minimal tenderness on palpation of LLQ, no rebound or guarding, no palpable masses or organomegaly, bowel sounds present. Extremities: No pedal edema, pulse present-2+- DP. Neuro- Alert and oriented.  Lab Results: Basic Metabolic Panel:  Recent Labs Lab 04/13/13 0700 04/14/13 0657  NA 135 135  K 4.0 3.4*  CL 99 101  CO2 23 22  GLUCOSE 244* 204*  BUN 7 13  CREATININE 0.61 0.78  CALCIUM 8.9 8.8  MG 1.6  --    Liver Function Tests:  Recent Labs Lab 04/11/13 1632 04/12/13 2200  AST 15 17  ALT 7 7  ALKPHOS 66 70  BILITOT 0.4 0.4  PROT 8.2 8.7*  ALBUMIN 3.4* 3.6    Recent Labs Lab 04/11/13 1632 04/12/13 2200  LIPASE 30 77*   CBC:  Recent Labs Lab 04/12/13 2200 04/13/13 0700 04/15/13 0825  WBC 16.2* 15.1* 9.4  NEUTROABS 11.4*  --  4.8  HGB 10.8* 11.0* 9.4*  HCT 32.9* 33.4* 29.3*  MCV 63.5* 65.1* 66.0*  PLT 542* 523* 408*   Cardiac Enzymes:  Recent Labs Lab 04/13/13 0700 04/13/13 1055 04/13/13 1650  TROPONINI <0.30 <0.30 <0.30   CBG:  Recent Labs Lab 04/14/13 1636 04/14/13 2004 04/15/13 0001  04/15/13 0401 04/15/13 0802 04/15/13 1202  GLUCAP 193* 360* 204* 251* 177* 266*   Hemoglobin A1C:  Recent Labs Lab 04/13/13 0700  HGBA1C 13.4*   Thyroid Function Tests:  Recent Labs Lab 04/13/13 0700  TSH 1.271   Urine Drug Screen: Drugs of Abuse     Component Value Date/Time   LABOPIA NONE DETECTED 04/13/2013 1700   LABOPIA NEGATIVE 02/21/2010 2201   COCAINSCRNUR POSITIVE* 04/13/2013 1700   COCAINSCRNUR NEGATIVE 02/21/2010 2201   LABBENZ NONE DETECTED 04/13/2013 1700   LABBENZ NEGATIVE 02/21/2010 2201   AMPHETMU NONE DETECTED 04/13/2013 1700   AMPHETMU NEGATIVE 02/21/2010 2201   THCU NONE DETECTED 04/13/2013 1700   LABBARB NONE DETECTED 04/13/2013 1700    Alcohol Level:  Recent Labs Lab 04/13/13 0700  ETH <11   Urinalysis:  Recent Labs Lab 04/13/13 0224  COLORURINE YELLOW  LABSPEC 1.015  PHURINE 6.5  GLUCOSEU >1000*  HGBUR NEGATIVE  BILIRUBINUR NEGATIVE  KETONESUR 40*  PROTEINUR NEGATIVE  UROBILINOGEN 1.0  NITRITE NEGATIVE  LEUKOCYTESUR SMALL*   Medications: I have reviewed the patient's current medications. Scheduled Meds: . ampicillin  500 mg Oral Q6H  . heparin  5,000 Units Subcutaneous Q8H  .  insulin aspart  0-15 Units Subcutaneous Q4H  . insulin glargine  10 Units Subcutaneous Daily  . mometasone-formoterol  2 puff Inhalation BID  . ondansetron  4 mg Oral Q6H   Or  . ondansetron (ZOFRAN) IV  4 mg Intravenous Q6H  . sodium chloride  3 mL Intravenous Q12H   Continuous Infusions: . sodium chloride 50 mL/hr at 04/15/13 0212   PRN Meds:.sodium chloride, hydrALAZINE, oxyCODONE-acetaminophen, sodium chloride Assessment/Plan: Principal Problem:   Abdominal pain, left lower quadrant Active Problems:   ANXIETY DEPRESSION   SUBSTANCE ABUSE, MULTIPLE   HYPERTENSION   CORONARY ARTERY DISEASE, S/P PTCA   NAUSEA AND VOMITING   Chronic hepatitis B   Diabetes mellitus type 2, uncontrolled, with complications   Abdominal pain  Abdominal  Pain- LLQ, with nausea and vomiting- Chronic, but nausea and vomiting appears unrelated to LLQ pain. CBC- 04/15/2013- 9.4, down from 16.2 - 04/12/2013, without significant intervention. Pelvic Exam was done yesterday- GC/Chalmydia- Negative, Wet prep- Few clue cells. - GI consulted yesterday, awaiting recs. - If still no explanatory cause, after GI recs, consider chronic pelvic pain vs chronic pain syndrome Vs endometriosis. Will also consider Gynae refferal. - Carb modified diet. - Urine culture- >100 000 colonies, positive for Group b strep. Wil continue antibiotic therapy with Ampicillin 500mg  Q6H for 3 days. - Changed pain meds to Oxycodone-acetaminophen- 5-325mg , 1-2 pills Q6h.   #DM2, uncontrolled- A1C 12.3 in 10/2012, BMP glucose 236. Patient has not been complaint with insulin recently due to financial constraints. Basal insulin requirement is 25 units daily based on weight and GFR.  -Lantus 10 units daily - SSI- Mod -CBGs q6h  -repeat A1C-13.4   #HTN- Home meds- Labtalol- 100mg  BID. Currently on Hydralazine PRN order- 5mg  Q6H, for systolic above 180mg . Blood pressure mostly controlled for the past 24 hrs on no meds. Restart blood pressure meds, if BP >150 systolic, and on discharge home. Pt also has a hx of cocaine abuse so beta blockers alone are Contraind.  #CAD- s/p s/p angioplasty (no stent) in 2008, repeat cath in 2010 showed no CAD progression. Currently stable, but trops X3 cycled on admission , negative.  X3- Neg. Atypical presentation would be more likely in this patient given that she is female and has diabetes.   #DVT PPX- heparin subq TID.  #Code status- Full code   Dispo: Disposition is deferred at this time, awaiting improvement of current medical problems.  Anticipated discharge in approximately 1-2 day(s).   The patient does have a current PCP Ula Lingo Montey Hora, MD) and does need an [facility] hospital follow-up appointment after discharge.  The patient does not know  have transportation limitations that hinder transportation to clinic appointments.  .Services Needed at time of discharge: Y = Yes, Blank = No PT:   OT:   RN:   Equipment:   Other:     LOS: 3 days   Kennis Carina, MD 04/15/2013, 12:19 PM

## 2013-04-15 NOTE — Progress Notes (Signed)
NURSING PROGRESS NOTE  [name] [id number] Discharge Data: 04/15/2013 7:04 PM Attending Provider: Farley Ly, MD [id number], [name], MD     Shirley [name] to be D/C'd Home per MD order.  Discussed with the patient the After Visit Summary and all questions fully answered. All IV's discontinued with no bleeding noted. All belongings returned to patient for patient to take home. Patient was given her pain medication prescription. Awaiting a ride.  Last Vital Signs:  Blood pressure 116/64, pulse 87, temperature 98.5 F (36.9 C), temperature source Oral, resp. rate 16, height 5\' 7"  (1.702 m), weight 96.4 kg (212 lb 8.4 oz), last menstrual period 03/01/2013, SpO2 100.00%.  Discharge Medication List   Medication List    STOP taking these medications       acetaminophen 500 MG tablet  Commonly known as:  TYLENOL     labetalol 100 MG tablet  Commonly known as:  NORMODYNE     metoCLOPramide 10 MG tablet  Commonly known as:  REGLAN      TAKE these medications       albuterol 108 (90 BASE) MCG/ACT inhaler  Commonly known as:  PROVENTIL HFA;VENTOLIN HFA  Inhale 2 puffs into the lungs every 4 (four) hours as needed for wheezing or shortness of breath.     amitriptyline 25 MG tablet  Commonly known as:  ELAVIL  Take 2 tablets (50 mg total) by mouth at bedtime.     amLODipine 5 MG tablet  Commonly known as:  NORVASC  Take 1 tablet (5 mg total) by mouth daily.     ampicillin 500 MG capsule  Commonly known as:  PRINCIPEN  Take 1 capsule (500 mg total) by mouth every 6 (six) hours.  Start taking on:  04/16/2013     calcium carbonate 500 MG chewable tablet  Commonly known as:  TUMS - dosed in mg elemental calcium  Chew 1 tablet by mouth daily.     Fluticasone-Salmeterol 100-50 MCG/DOSE Aepb  Commonly known as:  ADVAIR  Inhale 1 puff into the lungs every 12 (twelve) hours.     gabapentin 300 MG capsule  Commonly known as:  NEURONTIN  Take 300 mg by mouth 3  (three) times daily.     insulin aspart 100 UNIT/ML injection  Commonly known as:  novoLOG  Inject 2-12 Units into the skin 3 (three) times daily as needed for high blood sugar. Sliding scale- If sugar 150-199: inject 2 unit;  200-249: 4 units, 250-299: 7 units; 300-349: 10 units; Over 350: 12 units     insulin glargine 100 UNIT/ML injection  Commonly known as:  LANTUS  - Inject 0.02 mLs (2 Units total) into the skin at bedtime. Check sugar levels before meals and inject novolog as sliding scale listed below:  - If sugar 150-199: inject 2units; 200-249:inject 4 units; 250-299: 7 units; 300-349: 10units; Over 350: 12 units     omeprazole 40 MG capsule  Commonly known as:  PRILOSEC  Take 1 capsule (40 mg total) by mouth daily.     oxyCODONE-acetaminophen 5-325 MG per tablet  Commonly known as:  PERCOCET/ROXICET  Take 1-2 tablets by mouth every 6 (six) hours as needed for moderate pain or severe pain.     pregabalin 75 MG capsule  Commonly known as:  LYRICA  Take 1 capsule (75 mg total) by mouth 2 (two) times daily.     promethazine 25 MG tablet  Commonly known as:  PHENERGAN  Take 1 tablet (25  mg total) by mouth every 6 (six) hours as needed for nausea or vomiting.

## 2013-04-15 NOTE — Progress Notes (Signed)
Internal Medicine Attending  Date: 04/15/2013  Patient name: Shirley [name] Medical record number: [id number] Date of birth: 1976-02-08 Age: 37 y.o. Gender: female  I saw and evaluated the patient and discussed her care with house staff. I reviewed the resident's note by Dr. Mariea Clonts and I agree with the resident's findings and plans as documented in her note.

## 2013-04-18 NOTE — Discharge Summary (Signed)
Name: Shirley [name] MRN: [id number] DOB: 03-Jun-1975 37 y.o. PCP: Manuela Schwartz, MD  Date of Admission: 04/12/2013  9:14 PM Date of Discharge: 04/15/2013 Attending Physician: No att. providers found  Discharge Diagnosis:  Principal Problem:   Abdominal pain, left lower quadrant Active Problems:   ANXIETY DEPRESSION   SUBSTANCE ABUSE, MULTIPLE   HYPERTENSION   NAUSEA AND VOMITING   Chronic hepatitis B   Diabetes mellitus type 2, uncontrolled, with complications   Abdominal pain  Discharge Medications:   Medication List    STOP taking these medications       acetaminophen 500 MG tablet  Commonly known as:  TYLENOL     labetalol 100 MG tablet  Commonly known as:  NORMODYNE     metoCLOPramide 10 MG tablet  Commonly known as:  REGLAN      TAKE these medications       albuterol 108 (90 BASE) MCG/ACT inhaler  Commonly known as:  PROVENTIL HFA;VENTOLIN HFA  Inhale 2 puffs into the lungs every 4 (four) hours as needed for wheezing or shortness of breath.     amitriptyline 25 MG tablet  Commonly known as:  ELAVIL  Take 2 tablets (50 mg total) by mouth at bedtime.     amLODipine 5 MG tablet  Commonly known as:  NORVASC  Take 1 tablet (5 mg total) by mouth daily.     calcium carbonate 500 MG chewable tablet  Commonly known as:  TUMS - dosed in mg elemental calcium  Chew 1 tablet by mouth daily.     Fluticasone-Salmeterol 100-50 MCG/DOSE Aepb  Commonly known as:  ADVAIR  Inhale 1 puff into the lungs every 12 (twelve) hours.     gabapentin 300 MG capsule  Commonly known as:  NEURONTIN  Take 300 mg by mouth 3 (three) times daily.     insulin aspart 100 UNIT/ML injection  Commonly known as:  novoLOG  Inject 2-12 Units into the skin 3 (three) times daily as needed for high blood sugar. Sliding scale- If sugar 150-199: inject 2 unit;  200-249: 4 units, 250-299: 7 units; 300-349: 10 units; Over 350: 12 units     insulin glargine 100 UNIT/ML injection    Commonly known as:  LANTUS  - Inject 0.02 mLs (2 Units total) into the skin at bedtime. Check sugar levels before meals and inject novolog as sliding scale listed below:  - If sugar 150-199: inject 2units; 200-249:inject 4 units; 250-299: 7 units; 300-349: 10units; Over 350: 12 units     omeprazole 40 MG capsule  Commonly known as:  PRILOSEC  Take 1 capsule (40 mg total) by mouth daily.     oxyCODONE-acetaminophen 5-325 MG per tablet  Commonly known as:  PERCOCET/ROXICET  Take 1-2 tablets by mouth every 6 (six) hours as needed for moderate pain or severe pain.     pregabalin 75 MG capsule  Commonly known as:  LYRICA  Take 1 capsule (75 mg total) by mouth 2 (two) times daily.     promethazine 25 MG tablet  Commonly known as:  PHENERGAN  Take 1 tablet (25 mg total) by mouth every 6 (six) hours as needed for nausea or vomiting.        Disposition and follow-up:   [name] was discharged from [facility] in Good condition.  At the hospital follow up visit please address:  1.  Abdominal pain. Pt was supposed to follow up with GI but never did. Has  missed many appointments. If pain persists on follow up in clinic, consider GI refferal, If GI refferal offers no explanation, then a gynecology consult may be required to access for endometriosis. Did Patient fill the prescription for Ampicillin and completed the treatment- for 2 more days.  2. Stopping cocaine abuse, as this might be contributing to her abdominal pain. Patient compliance with anti-Dm regimen- as HBA1c- 13.4. Blood pressure check and possible up-titration of amlodipine if current dose is not adequate.  Follow-up Appointments:     Follow-up Information   Follow up with Kristie Cowman, MD On 04/21/2013. (At 10am.)    Specialty:  Internal Medicine   Contact information:   [address] [place] [address] [phone]       Discharge Instructions: Discharge Orders   Future  Appointments Provider Department Dept Phone   04/21/2013 10:00 AM Manuela Schwartz, MD [facility] [phone]   Future Orders Complete By Expires   Diet - low sodium heart healthy  As directed    Discharge instructions  As directed    Comments:     We will be discharging you home on a new blood pressure medication called amlodipine. You will take 5mg  once a day.  Also be sure to keep your appoitment on the 29th with [place] in clinic.   We are also prescribing percocet for you to take 1 tablet 8 hourly till we see you in clinic.  Please keep your appointments.   Increase activity slowly  As directed      Procedures Performed:  Ct Abdomen Pelvis W Contrast  04/11/2013   CLINICAL DATA:  Vomiting  EXAM: CT ABDOMEN AND PELVIS WITH CONTRAST  TECHNIQUE: Multidetector CT imaging of the abdomen and pelvis was performed using the standard protocol following bolus administration of intravenous contrast.  CONTRAST:  25mL OMNIPAQUE IOHEXOL 300 MG/ML SOLN, OMNIPAQUE IOHEXOL 300 MG/ML SOLN  COMPARISON:  08/15/2012  FINDINGS: Visualized lung bases clear. Unremarkable liver, gallbladder, spleen, adrenal glands, kidneys, pancreas, abdominal aorta, portal vein. Stomach is nondistended. Small bowel and colon are nondilated. Normal appendix. Degenerative disc disease L5-S1. Urinary bladder is incompletely distended and mildly thick-walled. Uterus and adnexal regions unremarkable. No ascites. No free air. No adenopathy localized.  IMPRESSION: 1. No acute abdominal process.   Electronically Signed   By: Oley Balm M.D.   On: 04/11/2013 18:48   Cardiac Cath: none.  Admission HPI: Chief Complaint: nausea/vomiting.  History of Present Illness:  [name] is a 37 year old woman with extensive past medical history including uncontrolled DM2 (A1C 12.3 in 10/2012, >14 previously) c/b gastroparesis with chronic nausea/vomiting, HTN, HL, CAD s/p PTCA, fibromyalgia, chronic LLQ  abdominal pain, cocaine abuse, morbid obesity who presents with nausea/vomiting.  Patient states she started feeling nauseated yesterday afternoon while she was shopping at the grocery store and has vomited 4-5 times since, NBNB. She was seen on both 12/19 and 12/20 in ED for this complaint. She felt better after leaving ED on 12/19 but ate pizza yesterday then had recurrent nausea/vomiting. She has a history of gastroparesis and states that this feels the same as prior episodes. She has not had any change in her bowel habits, last BM 3 hours ago (formed, brown, no blood). She is also experiencing her chronic LLQ abdominal pain/cramping, no change from prior. Reports dizziness during vomiting episodes but no fever/chills, no diarrhea, no chest pain; no sick contacts. She is not complaint with any of her medications including insulin,  metoclopramide, and anti-emetics.  Patient last saw PCP Dr. Bosie Clos in 10/2012. She has been referred to Dr. Elnoria Howard of GI multiple times but has not followed-up recently. Did not have gastric emptying study that was recommended in March and did not schedule EGD/colonoscopy "because she wasn't sick." She had a normal gastric emptying study in 2011 (but may have been false negative as patient was reportedly taking metoclopramide at that time).  In ED, patient received dilaudid 1 mg IV, phenergan 25 mg IV, NS 1 L bolus x 2 with [place] relief of her symptoms. She also had a CT abdomen/pelvis which showed no acute process.   Hospital Course by problem list:  # Nausea/vomiting-  Patient presented with Nausea and vomiting. Chronic issue for patient. Pt has been diagnosed with gastroparesis, but this has not been confirmed by Gastric emptying, as pt was on metoclopramide when she had a gastric emptying study in 2011 . Patient states this feels similar to previous episodes of gastroparesis. CT abdomen/pelvis in the ED, with no acute abdominal process. Troponin X2- Negative, EKG not  suggestive of ischemia,  Lipase mildly elevated at 77 (increased from 30 on 12/19), but abd CT done was not suggestive for pancreatitis. WBC elevated at 16.2, during admission, but reduced to 9.4 without significant intervention. Patient was made NPO, was given pain relief- initially with Iv dilaudid, then switched to orals , was placed on ondansetron and IVF N/s. With relief of nausea oral intake was gradually started and well tolerated. On discharge patient had had no episodes of vomiting in 3 days.   # Left Lower quadrant Abdominal pain- 2 years duration- Appeared unrelated to nausea and vomiting. Ct abd/pelvis- Impression- No acute abdominal process. Urine preg test was negative, Lipase- normal at 30, CMP-  Abn findings were of glucose- 288, and Albumin- 3.4, Mag- 1.6, initial elevation of CBC- 15.8. Urinalysis showed multiple bacteria, culture results were positive for Strep agalactiae,  >100 000 colonies. Patient was started on a 3 day course of Ampicillin at 500mg  Q6H. Pt had a pelvic exam done on the floor, no pelvic masses were palpable, wet prep, GC and chlamydia were all negative. Other etiologies also considered diverticulitis, but patient was afebrile, and duration of was not consistent, and CT abd was negative. Also considered was endometriosis, as patient said pain is sometimes worse with sexual activity and during her periods. This will require follow up with gynae as an out-pt if further work up does not identify a cause. Patient urine was positive for cocaine, considered was cocaine induced vasospasm, patient encouraged to quit use of cocaine. CBC on discharge revealed a WBC- 9.4. Patient had gotten 1 full day of antibiotics- Iv Ampicillin, was discharged home to complete two more days, also given percocet for pain relief. Discharged to followup as an out-patient. Follow up was arranged for patient on discharge.  #DM2, uncontrolled- HBA1C 12.3 in 10/2012. Patient has not been complaint with  insulin recently due to financial constraints. While on admission, Pt was given Lantus 10 units at night and placed on SSI. CBGs were checked Q6H.  Home meds- insulin aspart  SSI, and lantus- 2U at bedtime. HBA1c- 13.4. Home regimen continued on discharge, follow up as out-patient on improving patients compliance with regimen and possible adjustment.  #HTN- Patient not compliant with any of her home medications. Previous Home meds- Labetalol 100mg  BID. This was switched to amlodipine 5mg  daily with good response of patients blood pressure- considering hx of CAD with cocaine abuse. Follow  up as an out-pt.  # Anxiety/depression- Home meds- Amitryptiline- 50mg  daily. Continued on admission and on discharge.   Discharge Vitals:   BP 116/64  Pulse 87  Temp(Src) 98.5 F (36.9 C) (Oral)  Resp 16  Ht 5\' 7"  (1.702 m)  Wt 212 lb 8.4 oz (96.4 kg)  BMI 33.28 kg/m2  SpO2 100%  LMP 03/01/2013  Discharge Labs:  No results found for this or any previous visit (from the past 24 hour(s)).  Signed: Kennis Carina, MD 04/18/2013, 3:03 PM   Time Spent on Discharge: 35 minutes Services Ordered on Discharge: None Equipment Ordered on Discharge: None

## 2013-04-21 ENCOUNTER — Encounter: Payer: Medicaid Other | Admitting: Internal Medicine

## 2013-04-22 ENCOUNTER — Encounter: Payer: Self-pay | Admitting: Internal Medicine

## 2013-04-22 ENCOUNTER — Ambulatory Visit (INDEPENDENT_AMBULATORY_CARE_PROVIDER_SITE_OTHER): Payer: Medicaid Other | Admitting: Internal Medicine

## 2013-04-22 VITALS — BP 140/93 | HR 112 | Temp 98.0°F | Ht 67.0 in | Wt 226.5 lb

## 2013-04-22 DIAGNOSIS — I10 Essential (primary) hypertension: Secondary | ICD-10-CM

## 2013-04-22 DIAGNOSIS — E1165 Type 2 diabetes mellitus with hyperglycemia: Secondary | ICD-10-CM

## 2013-04-22 DIAGNOSIS — F1910 Other psychoactive substance abuse, uncomplicated: Secondary | ICD-10-CM

## 2013-04-22 DIAGNOSIS — N946 Dysmenorrhea, unspecified: Secondary | ICD-10-CM

## 2013-04-22 DIAGNOSIS — R109 Unspecified abdominal pain: Secondary | ICD-10-CM

## 2013-04-22 DIAGNOSIS — B373 Candidiasis of vulva and vagina: Secondary | ICD-10-CM

## 2013-04-22 DIAGNOSIS — E119 Type 2 diabetes mellitus without complications: Secondary | ICD-10-CM

## 2013-04-22 MED ORDER — OXYCODONE-ACETAMINOPHEN 5-325 MG PO TABS: 1.0000 | ORAL_TABLET | Freq: Four times a day (QID) | ORAL | Status: DC | PRN

## 2013-04-22 MED ORDER — FLUCONAZOLE 150 MG PO TABS: 150.0000 mg | ORAL_TABLET | Freq: Once | ORAL | Status: DC

## 2013-04-22 MED ORDER — AMLODIPINE BESYLATE 10 MG PO TABS: 10.0000 mg | ORAL_TABLET | Freq: Every day | ORAL | Status: DC

## 2013-04-22 NOTE — Progress Notes (Signed)
   Subjective:    Patient ID: Shirley [name], female    DOB: May 02, 1975, 37 y.o.   MRN: [id number]  HPI  Hx significant for chronic abdominal pain though secondary to possible gastroparesis, poorly controlled diabetes Type 2 secondary to noncompliance, polysubstance abuse and hypertension.  She presents for hospital follow-up 30 minutes late after no-showing yesterday. She states that she continues to have abdominal pain and only used cocaine recently to get rid of the pain.  She states that the Percocet which she was treated with in the hospital and given a short supply on discharge helped her pain.  She does have a hx of dymenorrhea thus possibililty of endometriosis is a concern. Reports persistent nausea and occasional vomiting but this has improved since hospitalization. She reports that since starting antibiotic (ampicillin) she has vaginal itchiness and a Vantrease bit of white discharge.  No fevers, no dysuria, no burning, no odor, no recent sexual intercourse.    Review of Systems  Constitutional: Negative for fever and fatigue.  HENT: Negative.   Eyes: Positive for visual disturbance.  Respiratory: Negative for shortness of breath.   Cardiovascular: Negative for chest pain and leg swelling.  Gastrointestinal: Positive for nausea, vomiting and abdominal pain. Negative for diarrhea, constipation, blood in stool, abdominal distention, anal bleeding and rectal pain.  Endocrine: Positive for polyuria.  Genitourinary: Positive for vaginal discharge. Negative for dysuria, frequency, hematuria, vaginal bleeding, genital sores and pelvic pain.  Musculoskeletal: Negative.   Skin: Negative.   Allergic/Immunologic: Negative.   Neurological: Negative for headaches.  Hematological: Negative.   Psychiatric/Behavioral: Negative.        Objective:   Physical Exam  Constitutional: She is oriented to person, place, and time. She appears well-developed and well-nourished. No distress.  Morbidly  obese  HENT:  Head: Normocephalic and atraumatic.  Eyes: Conjunctivae and EOM are normal. Pupils are equal, round, and reactive to light.  Neck: Neck supple.  Cardiovascular: Normal heart sounds.   Slightly tachycardic  Pulmonary/Chest: Effort normal and breath sounds normal.  Abdominal: Soft. Bowel sounds are normal. She exhibits no distension. There is no tenderness.  Musculoskeletal: Normal range of motion.  Neurological: She is alert and oriented to person, place, and time.  Skin: Skin is warm and dry.  Psychiatric: She has a normal mood and affect. Her behavior is normal. Judgment and thought content normal.          Assessment & Plan:  See separate problem-list charting:  #1 abdominal pain: secondary to gastroparesis vs endometriosis -appt with GI 04/30/13 -referral to GYN r/o endometriosis  #2 DM Type 2 poorly controlled: continue Lantus and Novolog SSI with meals -refer to Eye doc -will call with clarification of SSi insulin as she was unclear  #3 hypertension: goal 130 sbp, 140/93 on amlodipine 5 mg qd -increase amlodipineto 10 mg qd  #4 vaginal candidiasis, likely: secondary to antibiotic tx -empiric tx with diflucan 150 mg x1

## 2013-04-22 NOTE — Assessment & Plan Note (Signed)
F/u Dr. Elnoria Howard of GI 04/30/2013 at 9:30am for further evaluation of possible gastroparesis

## 2013-04-22 NOTE — Assessment & Plan Note (Addendum)
Lab Results  Component Value Date   HGBA1C 13.4* 04/13/2013   HGBA1C 12.3 11/04/2012   HGBA1C >14.0 07/15/2012     Assessment: Diabetes control: poor control (HgbA1C >9%) Progress toward A1C goal:  deteriorated Comments: poor compliance, didn't take her insulin today, cbg 400  Plan: Medications:  continue current medications Home glucose monitoring: Frequency:   Timing:   Instruction/counseling given: reminded to get eye exam Educational resources provided: brochure Self management tools provided:   Other plans: continue Lantus and SSI with meals, difficult to titrate as pt poorly compliant, does not check her cbgs

## 2013-04-22 NOTE — Assessment & Plan Note (Signed)
Ddx of her chronic abdominal pain includes endometriosis which has not been thorughly investigated thus far secondary to her poor compliance and no-show. Will referral to GYN to revaluate for endometriosis

## 2013-04-22 NOTE — Assessment & Plan Note (Addendum)
BP Readings from Last 3 Encounters:  04/22/13 140/93  04/15/13 116/64  04/11/13 125/96    Lab Results  Component Value Date   NA 135 04/14/2013   K 3.4* 04/14/2013   CREATININE 0.78 04/14/2013    Assessment: Blood pressure control: mildly elevated Progress toward BP goal:  improved Comments: started on amlodipine 5 mg in hospital  Plan: Medications:  continue current medications Educational resources provided: brochure Self management tools provided: home blood pressure logbook Other plans: will increase amlodipine to 10 mg qd, check K+ at next follow-up

## 2013-04-22 NOTE — Assessment & Plan Note (Signed)
Started 2 days after antibiotic regimen

## 2013-04-22 NOTE — Patient Instructions (Addendum)
General Instructions: Keep your appt with Dr. Elnoria Howard. He will decide if Percocet should be continued. We will refer your to Gynecology. Take all of your medications. Stop using cocaine. Follow-up in 3 months.   Treatment Goals:  Goals (1 Years of Data) as of 04/22/13         As of Today 04/15/13 04/15/13 04/14/13 04/14/13     Blood Pressure    . Blood Pressure < 140/90  140/93 116/64 134/81 133/83 146/90     Result Component    . HEMOGLOBIN A1C < 7.0          . LDL CALC < 100            Progress Toward Treatment Goals:  Treatment Goal 04/22/2013  Hemoglobin A1C deteriorated  Blood pressure improved    Self Care Goals & Plans:  Self Care Goal 04/22/2013  Manage my medications take my medicines as prescribed; bring my medications to every visit; refill my medications on time  Monitor my health -  Eat healthy foods drink diet soda or water instead of juice or soda; eat more vegetables; eat foods that are low in salt; eat baked foods instead of fried foods    Home Blood Glucose Monitoring 11/04/2012  Check my blood sugar 3 times a day     Care Management & Community Referrals:  No flowsheet data found.

## 2013-04-22 NOTE — Assessment & Plan Note (Signed)
Last cocaine use ~ 1 week ago.  States that she only used cocaine because she didn't have anything else to take away the pain

## 2013-04-29 ENCOUNTER — Inpatient Hospital Stay (HOSPITAL_COMMUNITY)
Admission: EM | Admit: 2013-04-29 | Discharge: 2013-05-01 | DRG: 392 | Disposition: A | Discharge status: Home / Self Care | Payer: Medicaid Other | Attending: Internal Medicine | Admitting: Internal Medicine

## 2013-04-29 ENCOUNTER — Encounter (HOSPITAL_COMMUNITY): Payer: Self-pay | Admitting: Emergency Medicine

## 2013-04-29 DIAGNOSIS — F411 Generalized anxiety disorder: Secondary | ICD-10-CM | POA: Diagnosis present

## 2013-04-29 DIAGNOSIS — G8929 Other chronic pain: Secondary | ICD-10-CM

## 2013-04-29 DIAGNOSIS — Z8673 Personal history of transient ischemic attack (TIA), and cerebral infarction without residual deficits: Secondary | ICD-10-CM

## 2013-04-29 DIAGNOSIS — E860 Dehydration: Secondary | ICD-10-CM | POA: Diagnosis present

## 2013-04-29 DIAGNOSIS — Z91012 Allergy to eggs: Secondary | ICD-10-CM

## 2013-04-29 DIAGNOSIS — J45909 Unspecified asthma, uncomplicated: Secondary | ICD-10-CM | POA: Diagnosis present

## 2013-04-29 DIAGNOSIS — E118 Type 2 diabetes mellitus with unspecified complications: Secondary | ICD-10-CM

## 2013-04-29 DIAGNOSIS — R1032 Left lower quadrant pain: Secondary | ICD-10-CM

## 2013-04-29 DIAGNOSIS — D509 Iron deficiency anemia, unspecified: Secondary | ICD-10-CM | POA: Diagnosis present

## 2013-04-29 DIAGNOSIS — R6510 Systemic inflammatory response syndrome (SIRS) of non-infectious origin without acute organ dysfunction: Secondary | ICD-10-CM | POA: Diagnosis present

## 2013-04-29 DIAGNOSIS — R531 Weakness: Secondary | ICD-10-CM | POA: Diagnosis present

## 2013-04-29 DIAGNOSIS — E1165 Type 2 diabetes mellitus with hyperglycemia: Secondary | ICD-10-CM

## 2013-04-29 DIAGNOSIS — G4733 Obstructive sleep apnea (adult) (pediatric): Secondary | ICD-10-CM | POA: Diagnosis present

## 2013-04-29 DIAGNOSIS — R1115 Cyclical vomiting syndrome unrelated to migraine: Secondary | ICD-10-CM

## 2013-04-29 DIAGNOSIS — IMO0001 Reserved for inherently not codable concepts without codable children: Secondary | ICD-10-CM | POA: Diagnosis present

## 2013-04-29 DIAGNOSIS — R1110 Vomiting, unspecified: Secondary | ICD-10-CM | POA: Diagnosis present

## 2013-04-29 DIAGNOSIS — D72829 Elevated white blood cell count, unspecified: Secondary | ICD-10-CM | POA: Diagnosis present

## 2013-04-29 DIAGNOSIS — Z833 Family history of diabetes mellitus: Secondary | ICD-10-CM

## 2013-04-29 DIAGNOSIS — I2510 Atherosclerotic heart disease of native coronary artery without angina pectoris: Secondary | ICD-10-CM | POA: Diagnosis present

## 2013-04-29 DIAGNOSIS — F3289 Other specified depressive episodes: Secondary | ICD-10-CM

## 2013-04-29 DIAGNOSIS — IMO0002 Reserved for concepts with insufficient information to code with codable children: Secondary | ICD-10-CM | POA: Diagnosis present

## 2013-04-29 DIAGNOSIS — R109 Unspecified abdominal pain: Secondary | ICD-10-CM

## 2013-04-29 DIAGNOSIS — F1210 Cannabis abuse, uncomplicated: Secondary | ICD-10-CM | POA: Diagnosis present

## 2013-04-29 DIAGNOSIS — K219 Gastro-esophageal reflux disease without esophagitis: Secondary | ICD-10-CM | POA: Diagnosis present

## 2013-04-29 DIAGNOSIS — E785 Hyperlipidemia, unspecified: Secondary | ICD-10-CM | POA: Diagnosis present

## 2013-04-29 DIAGNOSIS — F329 Major depressive disorder, single episode, unspecified: Secondary | ICD-10-CM

## 2013-04-29 DIAGNOSIS — Z794 Long term (current) use of insulin: Secondary | ICD-10-CM

## 2013-04-29 DIAGNOSIS — R112 Nausea with vomiting, unspecified: Principal | ICD-10-CM

## 2013-04-29 DIAGNOSIS — I10 Essential (primary) hypertension: Secondary | ICD-10-CM | POA: Diagnosis present

## 2013-04-29 DIAGNOSIS — I252 Old myocardial infarction: Secondary | ICD-10-CM

## 2013-04-29 DIAGNOSIS — D473 Essential (hemorrhagic) thrombocythemia: Secondary | ICD-10-CM | POA: Diagnosis present

## 2013-04-29 DIAGNOSIS — E1110 Type 2 diabetes mellitus with ketoacidosis without coma: Secondary | ICD-10-CM | POA: Insufficient documentation

## 2013-04-29 DIAGNOSIS — Z87891 Personal history of nicotine dependence: Secondary | ICD-10-CM

## 2013-04-29 LAB — URINALYSIS, ROUTINE W REFLEX MICROSCOPIC
Bilirubin Urine: NEGATIVE
Glucose, UA: >1000 mg/dL — AB
Hgb urine dipstick: NEGATIVE
Ketones, ur: >80 mg/dL — AB
NITRITE: NEGATIVE
PROTEIN: NEGATIVE mg/dL
Specific Gravity, Urine: 1.025 (ref 1.005–1.030)
UROBILINOGEN UA: 1 mg/dL (ref 0.0–1.0)
pH: 6 (ref 5.0–8.0)

## 2013-04-29 LAB — COMPREHENSIVE METABOLIC PANEL
ALBUMIN: 3.5 g/dL (ref 3.5–5.2)
ALT: 6 U/L (ref 0–35)
ALT: 8 U/L (ref 0–35)
AST: 13 U/L (ref 0–37)
AST: 14 U/L (ref 0–37)
Albumin: 3.2 g/dL — ABNORMAL LOW (ref 3.5–5.2)
Alkaline Phosphatase: 71 U/L (ref 39–117)
Alkaline Phosphatase: 68 U/L (ref 39–117)
BILIRUBIN TOTAL: 0.3 mg/dL (ref 0.3–1.2)
BUN: 11 mg/dL (ref 6–23)
BUN: 10 mg/dL (ref 6–23)
CALCIUM: 9.1 mg/dL (ref 8.4–10.5)
CHLORIDE: 97 meq/L (ref 96–112)
CO2: 23 meq/L (ref 19–32)
CO2: 25 meq/L (ref 19–32)
CREATININE: 0.66 mg/dL (ref 0.50–1.10)
CREATININE: 0.73 mg/dL (ref 0.50–1.10)
Calcium: 9.3 mg/dL (ref 8.4–10.5)
Chloride: 101 mEq/L (ref 96–112)
GFR calc non Af Amer: >90 mL/min (ref >90)
GLUCOSE: 210 mg/dL — AB (ref 70–99)
Glucose, Bld: 213 mg/dL — ABNORMAL HIGH (ref 70–99)
Potassium: 4 mEq/L (ref 3.7–5.3)
Potassium: 3.8 mEq/L (ref 3.7–5.3)
Sodium: 135 mEq/L — ABNORMAL LOW (ref 137–147)
Sodium: 139 mEq/L (ref 137–147)
TOTAL PROTEIN: 7.7 g/dL (ref 6.0–8.3)
Total Bilirubin: 0.3 mg/dL (ref 0.3–1.2)
Total Protein: 8.3 g/dL (ref 6.0–8.3)

## 2013-04-29 LAB — CBC WITH DIFFERENTIAL/PLATELET
BASOS ABS: 0 10*3/uL (ref 0.0–0.1)
BASOS PCT: 0 % (ref 0–1)
Basophils Absolute: 0 10*3/uL (ref 0.0–0.1)
Basophils Relative: 0 % (ref 0–1)
EOS PCT: 1 % (ref 0–5)
Eosinophils Absolute: 0 10*3/uL (ref 0.0–0.7)
Eosinophils Absolute: 0.2 10*3/uL (ref 0.0–0.7)
Eosinophils Relative: 0 % (ref 0–5)
HCT: 33.6 % — ABNORMAL LOW (ref 36.0–46.0)
HCT: 31.9 % — ABNORMAL LOW (ref 36.0–46.0)
HEMOGLOBIN: 10.4 g/dL — AB (ref 12.0–15.0)
Hemoglobin: 11.2 g/dL — ABNORMAL LOW (ref 12.0–15.0)
LYMPHS PCT: 29 % (ref 12–46)
Lymphocytes Relative: 12 % (ref 12–46)
Lymphs Abs: 1.8 10*3/uL (ref 0.7–4.0)
Lymphs Abs: 4.8 10*3/uL — ABNORMAL HIGH (ref 0.7–4.0)
MCH: 21.6 pg — ABNORMAL LOW (ref 26.0–34.0)
MCH: 21.4 pg — ABNORMAL LOW (ref 26.0–34.0)
MCHC: 33.3 g/dL (ref 30.0–36.0)
MCHC: 32.6 g/dL (ref 30.0–36.0)
MCV: 64.9 fL — ABNORMAL LOW (ref 78.0–100.0)
MCV: 65.5 fL — ABNORMAL LOW (ref 78.0–100.0)
MONOS PCT: 3 % (ref 3–12)
Monocytes Absolute: 0.4 10*3/uL (ref 0.1–1.0)
Monocytes Absolute: 1.3 10*3/uL — ABNORMAL HIGH (ref 0.1–1.0)
Monocytes Relative: 8 % (ref 3–12)
Neutro Abs: 12.4 10*3/uL — ABNORMAL HIGH (ref 1.7–7.7)
Neutro Abs: 10.2 10*3/uL — ABNORMAL HIGH (ref 1.7–7.7)
Neutrophils Relative %: 85 % — ABNORMAL HIGH (ref 43–77)
Neutrophils Relative %: 62 % (ref 43–77)
Platelets: 486 10*3/uL — ABNORMAL HIGH (ref 150–400)
Platelets: 507 10*3/uL — ABNORMAL HIGH (ref 150–400)
RBC: 5.18 MIL/uL — ABNORMAL HIGH (ref 3.87–5.11)
RBC: 4.87 MIL/uL (ref 3.87–5.11)
RDW: 18.2 % — ABNORMAL HIGH (ref 11.5–15.5)
RDW: 18.5 % — ABNORMAL HIGH (ref 11.5–15.5)
WBC: 14.6 10*3/uL — AB (ref 4.0–10.5)
WBC: 16.5 10*3/uL — ABNORMAL HIGH (ref 4.0–10.5)

## 2013-04-29 LAB — RAPID URINE DRUG SCREEN, HOSP PERFORMED
Amphetamines: NOT DETECTED
Barbiturates: NOT DETECTED
Benzodiazepines: NOT DETECTED
Cocaine: NOT DETECTED
Opiates: POSITIVE — AB
Tetrahydrocannabinol: NOT DETECTED

## 2013-04-29 LAB — URINE MICROSCOPIC-ADD ON

## 2013-04-29 LAB — GLUCOSE, CAPILLARY
Glucose-Capillary: 200 mg/dL — ABNORMAL HIGH (ref 70–99)
Glucose-Capillary: 308 mg/dL — ABNORMAL HIGH (ref 70–99)

## 2013-04-29 LAB — MAGNESIUM: MAGNESIUM: 1.7 mg/dL (ref 1.5–2.5)

## 2013-04-29 LAB — LIPASE, BLOOD: Lipase: 27 U/L (ref 11–59)

## 2013-04-29 LAB — LACTIC ACID, PLASMA: LACTIC ACID, VENOUS: 3.8 mmol/L — AB (ref 0.5–2.2)

## 2013-04-29 MED ORDER — SODIUM CHLORIDE 0.9 % IV BOLUS (SEPSIS)
1000.0000 mL | Freq: Once | INTRAVENOUS | Status: AC
  Administered 2013-04-29: 1000 mL via INTRAVENOUS

## 2013-04-29 MED ORDER — ONDANSETRON HCL 4 MG/2ML IJ SOLN
4.0000 mg | Freq: Once | INTRAMUSCULAR | Status: AC
  Administered 2013-04-29: 4 mg via INTRAVENOUS
  Filled 2013-04-29: qty 2

## 2013-04-29 MED ORDER — DIPHENHYDRAMINE HCL 50 MG/ML IJ SOLN
25.0000 mg | Freq: Once | INTRAMUSCULAR | Status: AC
  Administered 2013-04-29: 25 mg via INTRAVENOUS
  Filled 2013-04-29: qty 1

## 2013-04-29 MED ORDER — ASPIRIN EC 81 MG PO TBEC
81.0000 mg | DELAYED_RELEASE_TABLET | Freq: Every day | ORAL | Status: DC
  Administered 2013-04-29: 81 mg via ORAL
  Filled 2013-04-29 (×3): qty 1

## 2013-04-29 MED ORDER — FENTANYL CITRATE 0.05 MG/ML IJ SOLN
50.0000 ug | Freq: Once | INTRAMUSCULAR | Status: AC
  Administered 2013-04-29: 50 ug via INTRAVENOUS
  Filled 2013-04-29: qty 2

## 2013-04-29 MED ORDER — ONDANSETRON HCL 4 MG/2ML IJ SOLN
4.0000 mg | Freq: Four times a day (QID) | INTRAMUSCULAR | Status: DC | PRN
  Administered 2013-04-30 – 2013-05-01 (×2): 4 mg via INTRAVENOUS
  Filled 2013-04-29 (×2): qty 2

## 2013-04-29 MED ORDER — MORPHINE SULFATE 4 MG/ML IJ SOLN
6.0000 mg | Freq: Once | INTRAMUSCULAR | Status: DC
  Filled 2013-04-29: qty 2

## 2013-04-29 MED ORDER — DEXTROSE-NACL 5-0.45 % IV SOLN: INTRAVENOUS | Status: DC

## 2013-04-29 MED ORDER — ONDANSETRON HCL 4 MG PO TABS
4.0000 mg | ORAL_TABLET | Freq: Four times a day (QID) | ORAL | Status: DC | PRN
  Administered 2013-04-30 – 2013-05-01 (×2): 4 mg via ORAL
  Filled 2013-04-29 (×2): qty 1

## 2013-04-29 MED ORDER — MORPHINE SULFATE 4 MG/ML IJ SOLN: 4.0000 mg | Freq: Once | INTRAMUSCULAR | Status: DC

## 2013-04-29 MED ORDER — HYDROMORPHONE HCL PF 1 MG/ML IJ SOLN
1.0000 mg | Freq: Once | INTRAMUSCULAR | Status: AC
  Administered 2013-04-29: 1 mg via INTRAVENOUS
  Filled 2013-04-29: qty 1

## 2013-04-29 MED ORDER — OXYCODONE HCL 5 MG PO TABS
5.0000 mg | ORAL_TABLET | ORAL | Status: DC | PRN
  Administered 2013-04-29: 5 mg via ORAL
  Filled 2013-04-29 (×2): qty 1

## 2013-04-29 MED ORDER — INSULIN ASPART 100 UNIT/ML ~~LOC~~ SOLN
0.0000 [IU] | SUBCUTANEOUS | Status: DC
  Administered 2013-04-29: 7 [IU] via SUBCUTANEOUS
  Administered 2013-04-30: 3 [IU] via SUBCUTANEOUS
  Administered 2013-04-30: 2 [IU] via SUBCUTANEOUS
  Administered 2013-04-30 (×2): 5 [IU] via SUBCUTANEOUS
  Administered 2013-04-30: 1 [IU] via SUBCUTANEOUS
  Administered 2013-04-30: 3 [IU] via SUBCUTANEOUS
  Administered 2013-05-01: 7 [IU] via SUBCUTANEOUS
  Administered 2013-05-01: 2 [IU] via SUBCUTANEOUS
  Administered 2013-05-01: 7 [IU] via SUBCUTANEOUS

## 2013-04-29 MED ORDER — PROMETHAZINE HCL 25 MG/ML IJ SOLN
25.0000 mg | Freq: Once | INTRAMUSCULAR | Status: AC
  Administered 2013-04-29: 25 mg via INTRAVENOUS
  Filled 2013-04-29: qty 1

## 2013-04-29 MED ORDER — MORPHINE SULFATE 4 MG/ML IJ SOLN
4.0000 mg | Freq: Once | INTRAMUSCULAR | Status: AC
  Administered 2013-04-29: 4 mg via INTRAVENOUS

## 2013-04-29 MED ORDER — SODIUM CHLORIDE 0.9 % IV SOLN
INTRAVENOUS | Status: AC
  Administered 2013-04-29: 20:00:00 via INTRAVENOUS

## 2013-04-29 MED ORDER — SODIUM CHLORIDE 0.9 % IV SOLN
INTRAVENOUS | Status: DC
  Administered 2013-04-30: 150 mL/h via INTRAVENOUS

## 2013-04-29 MED ORDER — ACETAMINOPHEN 325 MG PO TABS
650.0000 mg | ORAL_TABLET | Freq: Four times a day (QID) | ORAL | Status: DC | PRN
  Administered 2013-05-01: 650 mg via ORAL
  Filled 2013-04-29: qty 2

## 2013-04-29 MED ORDER — ENOXAPARIN SODIUM 60 MG/0.6ML ~~LOC~~ SOLN
50.0000 mg | SUBCUTANEOUS | Status: DC
  Administered 2013-04-29 – 2013-04-30 (×2): 50 mg via SUBCUTANEOUS
  Filled 2013-04-29 (×4): qty 0.6

## 2013-04-29 MED ORDER — SODIUM CHLORIDE 0.9 % IJ SOLN: 3.0000 mL | Freq: Two times a day (BID) | INTRAMUSCULAR | Status: DC

## 2013-04-29 MED ORDER — SODIUM CHLORIDE 0.9 % IV SOLN
INTRAVENOUS | Status: DC
  Filled 2013-04-29: qty 1

## 2013-04-29 MED ORDER — PROMETHAZINE HCL 25 MG/ML IJ SOLN: 25.0000 mg | Freq: Once | INTRAMUSCULAR | Status: DC

## 2013-04-29 MED ORDER — ENOXAPARIN SODIUM 30 MG/0.3ML ~~LOC~~ SOLN: 30.0000 mg | SUBCUTANEOUS | Status: DC

## 2013-04-29 MED ORDER — ONDANSETRON HCL 4 MG/2ML IJ SOLN: 4.0000 mg | Freq: Three times a day (TID) | INTRAMUSCULAR | Status: DC | PRN

## 2013-04-29 MED ORDER — DEXTROSE 50 % IV SOLN: 25.0000 mL | INTRAVENOUS | Status: DC | PRN

## 2013-04-29 MED ORDER — INSULIN REGULAR BOLUS VIA INFUSION
0.0000 [IU] | Freq: Three times a day (TID) | INTRAVENOUS | Status: DC
  Filled 2013-04-29: qty 10

## 2013-04-29 MED ORDER — ACETAMINOPHEN 650 MG RE SUPP: 650.0000 mg | Freq: Four times a day (QID) | RECTAL | Status: DC | PRN

## 2013-04-29 NOTE — Progress Notes (Signed)
RN calling unit 6 at [facility] to give report on patient going to [place].

## 2013-04-29 NOTE — Progress Notes (Signed)
[facility] was called 

## 2013-04-29 NOTE — Progress Notes (Signed)
Patient stated that she had not eaten any food for 3 days. Pt. Stated that she wanted me to call the doctor. RN placed a text message to admissions MD. Awaiting any new orders.

## 2013-04-29 NOTE — ED Provider Notes (Signed)
CSN: [id number]     Arrival date & time 04/29/13  0152 History   First MD Initiated Contact with Patient 04/29/13 0601     Chief Complaint  Patient presents with  . Generalized Body Aches  . Emesis   (Consider location/radiation/quality/duration/timing/severity/associated sxs/prior Treatment) HPI Comments: Patient with history of chronic abdominal pain, intractable nausea and vomiting, questionable gastroparesis, polysubstance abuse, diabetes -- presents with complaint of nausea, vomiting, generalized aches. Patient states that the symptoms have been ongoing for 3 days and are similar to previous symptoms. She denies documented fever, urinary symptoms, diarrhea. She has been taking phenergan and percocet at home which typically help but are not currently controlling symptoms. Emesis is nonbloody, nonbilious. Diarrhea is nonbloody. Past surgical history significant for C-section. The onset of this condition was acute. The course is constant. Aggravating factors: none. Alleviating factors: none.    The history is provided by the patient and medical records.    Past Medical History  Diagnosis Date  . Thrombocytosis     Hem/Onc suggested 2/2 chronic hepatits and/or iron deficiency anemia  . Iron deficiency anemia   . N&V (nausea and vomiting)     Chronic. Unclear etiology with multiple admission and ED visits. CT abdomen with and without contrast (02/2011)  showed no acute process. Gastic Emptying scan (01/2010) was normal. Ultrasound of the abdomen was within normal limits. Hepatitis B viral load was undectable. HIV NR. EGD - gastritis, Hpylori + s/p Rx  . Abdominal pain   . Hypertension   . Hyperlipidemia   . Diabetes mellitus type 2, uncontrolled, with complications   . Recurrent boils   . Back pain   . OSA (obstructive sleep apnea)   . 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 Cath 12/10- no restenosis or significant CAD progression  . Irregular menses     Small ovarian follicles seen on [id number]  . History of pyelonephritis     H/o GrpB Pyelonephritis (9/06) and UTI- 07/11- E.Coli, 12/10- GBS  . Abscess of tunica vaginalis     10/09- Abundant S. aureus- sensitive to all abx  . Obesity   . Asthma   . Gastritis   . Depression   . Hepatitis B, chronic     Hep BeAb+,Hep B cAb+ & Hep BsAg+ (9/06)  . Peripheral neuropathy   . Fibromyalgia   . GERD (gastroesophageal reflux disease)   . Migraine   . Anxiety   . Gastroparesis     secondary to poorly controlled DM, last emptying study performed 01/2010  was normal but may be falsely positive as pt was on reglan  . Blood dyscrasia   . H/O: CVA (cerebrovascular accident)     history of remote right cerebellar infarct noted on head CT at least since 10/2011   Past Surgical History  Procedure Laterality Date  . Cesarean section  1997   Family History  Problem Relation Age of Onset  . Diabetes Father    History  Substance Use Topics  . Smoking status: Former Smoker    Types: Cigarettes    Quit date: 04/24/1996  . Smokeless tobacco: Never Used     Comment: quit smoking cigarettes age 31  . Alcohol Use: Yes     Comment: occasionally   OB History   Grav Para Term Preterm Abortions TAB SAB Ect Mult Living  Review of Systems  Constitutional: Negative for fever.  HENT: Negative for rhinorrhea and sore throat.   Eyes: Negative for redness.  Respiratory: Negative for cough.   Cardiovascular: Negative for chest pain.  Gastrointestinal: Positive for nausea, vomiting and abdominal pain. Negative for diarrhea.  Genitourinary: Negative for dysuria.  Musculoskeletal: Positive for myalgias.  Skin: Negative for rash.  Neurological: Negative for headaches.    Allergies  Lisinopril and Morphine and related  Home Medications   Current Outpatient Rx  Name  Route  Sig  Dispense  Refill   . albuterol (PROVENTIL HFA;VENTOLIN HFA) 108 (90 BASE) MCG/ACT inhaler   Inhalation   Inhale 2 puffs into the lungs every 4 (four) hours as needed for wheezing or shortness of breath.         Marland Kitchen amitriptyline (ELAVIL) 25 MG tablet   Oral   Take 2 tablets (50 mg total) by mouth at bedtime.   60 tablet   3   . amLODipine (NORVASC) 10 MG tablet   Oral   Take 1 tablet (10 mg total) by mouth daily.   30 tablet   6   . calcium carbonate (TUMS - DOSED IN MG ELEMENTAL CALCIUM) 500 MG chewable tablet   Oral   Chew 1 tablet by mouth daily.         . Fluticasone-Salmeterol (ADVAIR) 100-50 MCG/DOSE AEPB   Inhalation   Inhale 1 puff into the lungs every 12 (twelve) hours.   60 each   6   . gabapentin (NEURONTIN) 300 MG capsule   Oral   Take 300 mg by mouth 3 (three) times daily.         . insulin aspart (NOVOLOG) 100 UNIT/ML injection   Subcutaneous   Inject 2-12 Units into the skin 3 (three) times daily as needed for high blood sugar. Sliding scale- If sugar 150-199: inject 2 unit;  200-249: 4 units, 250-299: 7 units; 300-349: 10 units; Over 350: 12 units         . insulin glargine (LANTUS) 100 UNIT/ML injection   Subcutaneous   Inject 0.02 mLs (2 Units total) into the skin at bedtime. Check sugar levels before meals and inject novolog as sliding scale listed below: If sugar 150-199: inject 2units; 200-249:inject 4 units; 250-299: 7 units; 300-349: 10units; Over 350: 12 units   10 mL   6   . omeprazole (PRILOSEC) 40 MG capsule   Oral   Take 1 capsule (40 mg total) by mouth daily.   31 capsule   6   . oxyCODONE-acetaminophen (PERCOCET/ROXICET) 5-325 MG per tablet   Oral   Take 1-2 tablets by mouth every 6 (six) hours as needed for moderate pain or severe pain.   24 tablet   0   . pregabalin (LYRICA) 75 MG capsule   Oral   Take 1 capsule (75 mg total) by mouth 2 (two) times daily.   60 capsule   6   . promethazine (PHENERGAN) 25 MG tablet   Oral   Take 1 tablet  (25 mg total) by mouth every 6 (six) hours as needed for nausea or vomiting.   30 tablet   0    BP 174/112  Pulse 118  Temp(Src) 98.2 F (36.8 C) (Oral)  Resp 22  SpO2 98%  LMP 04/29/2013 Physical Exam  Nursing note and vitals reviewed. Constitutional: She appears well-developed and well-nourished.  HENT:  Head: Normocephalic and atraumatic.  Eyes: Conjunctivae are normal. Right eye exhibits no discharge. Left eye  exhibits no discharge.  Neck: Normal range of motion. Neck supple.  Cardiovascular: Normal rate, regular rhythm and normal heart sounds.   Pulmonary/Chest: Effort normal and breath sounds normal.  Abdominal: Soft. There is no tenderness.  Neurological: She is alert.  Skin: Skin is warm and dry.  Psychiatric: She has a normal mood and affect.    ED Course  Procedures (including critical care time) Labs Review Labs Reviewed  GLUCOSE, CAPILLARY - Abnormal; Notable for the following:    Glucose-Capillary 200 (*)    All other components within normal limits  CBC WITH DIFFERENTIAL - Abnormal; Notable for the following:    WBC 14.6 (*)    RBC 5.18 (*)    Hemoglobin 11.2 (*)    HCT 33.6 (*)    MCV 64.9 (*)    MCH 21.6 (*)    RDW 18.2 (*)    Platelets 486 (*)    Neutrophils Relative % 85 (*)    Neutro Abs 12.4 (*)    All other components within normal limits  COMPREHENSIVE METABOLIC PANEL - Abnormal; Notable for the following:    Sodium 135 (*)    Glucose, Bld 213 (*)    All other components within normal limits  URINALYSIS, ROUTINE W REFLEX MICROSCOPIC - Abnormal; Notable for the following:    Glucose, UA >1000 (*)    Ketones, ur >80 (*)    Leukocytes, UA MODERATE (*)    All other components within normal limits  URINE RAPID DRUG SCREEN (HOSP PERFORMED) - Abnormal; Notable for the following:    Opiates POSITIVE (*)    All other components within normal limits  URINE MICROSCOPIC-ADD ON - Abnormal; Notable for the following:    Squamous Epithelial / LPF FEW  (*)    Casts HYALINE CASTS (*)    All other components within normal limits  URINE CULTURE  LIPASE, BLOOD   Imaging Review No results found.  EKG Interpretation   None      6:13 AM Patient seen and examined. Work-up initiated. Medications ordered. Patient is actively vomiting.   Vital signs reviewed and are as follows: Filed Vitals:   04/29/13 0155  BP: 174/112  Pulse: 118  Temp: 98.2 F (36.8 C)  Resp: 22   Patient continues to have pain and vomiting. Phenergan given x 2. Will give zofran, additional pain meds. Awaiting UA, cath ordered.   10:32 AM Called to patient room by nurse for concerns of phebitis in R forearm. Patient states she had burning sensation after first dose, worse after 2nd dose. Patient has palpable, tender phebitis extending proximally from first IV site. Two small areas of erythema. Compartments of forearm are soft. 2+ radial pulse. I have ordered warm compress, elevation, and IV benadryl.   2:26 PM Patient continued to have N/V despite antiemetics. + orthostatics. HR improved to 100 at rest, but increases to 130 when standing. >80 ketones in urine. Will admit for symptom control.   BP 121/86  Pulse 98  Temp(Src) 99 F (37.2 C) (Oral)  Resp 16  SpO2 99%  LMP 04/29/2013    MDM   1. Intractable nausea and vomiting   2. Dehydration   3. Chronic abdominal pain    Admit for above.    Carlisle Cater, PA-C 04/29/13 1428

## 2013-04-29 NOTE — ED Notes (Signed)
Pt. Was complaining about pain in her abdominal area. Pt.stated that she needed pain medication along with nausea meds. Pt. Is aware of giving a urine specimen. Nurse was notified.

## 2013-04-29 NOTE — H&P (Signed)
Date: 04/30/2013               Patient Name:  Shirley [name] MRN: [id number]  DOB: 07/28/1975 Age / Sex: 39 y.o., female   PCP: Jeralene Huff, MD           Medical Service: Internal Medicine Teaching Service         Attending Physician: Dr. Sid Falcon, MD    First Contact: Dr. Juluis Mire, MD Pager: [phone]  Second Contact: Dr. Gaspar Bidding, MD Pager: [phone]       After Hours (After 5p/  First Contact Pager: [phone]  weekends / holidays): Second Contact Pager: [phone]    Most Recent Discharge Date:  04/15/13  Chief Complaint:  Chief Complaint  Patient presents with  . Generalized Body Aches  . Emesis       History of present illness: Pt is a 38 y.o. female with a PMH of chronic N&V thought to be secondary to gastroparesis (normal gastric emptying study 2011), chronic adominal pain, HTN, dyslipidemia, uncontrolled DM2, chronic back pain, OSA, CAD, (06/15/2006), pyelonephritis, asthma, anxiety/depression, hepatitis B, fibromyalgia, GERD, and CVA who presents to the [facility] ED with nausea/vomiting/abdominal pain and was transferred to [facility] for further evaluation.  She was discharged on 04/15/13 for similar complaints.  She began feeling nauseated and began vomiting (NBNB) 2-3 days ago.  Vomiting is unrelated to food.  She reports decreased po intake for the past 3 days.  She reports symptoms have been going on since being diagnosed with H pylori two years ago.  She denies any fever/chills, CP, SOB, or diarrhea.  She reports palpitations.  She takes phenergan and percocet at home which typically help.  She reports zofran relieves her nausea.    On previous admission, workup was non-revealing to the etiology of her N/V and abdominal pain.  Pt has been to the ED 4 times in the past 6 months for similar complaints.     Meds: Current Facility-Administered Medications  Medication Dose Route Frequency Provider Last Rate Last Dose  . 0.9 %  sodium chloride infusion    Intravenous STAT Carlisle Cater, PA-C 125 mL/hr at 04/29/13 2018    . 0.9 %  sodium chloride infusion   Intravenous Continuous Blain Pais, MD 75 mL/hr at 04/30/13 0403 75 mL/hr at 04/30/13 0403  . acetaminophen (TYLENOL) tablet 650 mg  650 mg Oral Q6H PRN Allie Bossier, MD       Or  . acetaminophen (TYLENOL) suppository 650 mg  650 mg Rectal Q6H PRN Allie Bossier, MD      . albuterol (PROVENTIL) (2.5 MG/3ML) 0.083% nebulizer solution 2.5 mg  2.5 mg Nebulization Q4H PRN Sid Falcon, MD      . amitriptyline (ELAVIL) tablet 50 mg  50 mg Oral QHS Blain Pais, MD      . amLODipine (NORVASC) tablet 10 mg  10 mg Oral Daily Blain Pais, MD      . enoxaparin (LOVENOX) injection 50 mg  50 mg Subcutaneous Q24H Allie Bossier, MD   50 mg at 04/29/13 2200  . gabapentin (NEURONTIN) capsule 300 mg  300 mg Oral TID Blain Pais, MD      . insulin aspart (novoLOG) injection 0-9 Units  0-9 Units Subcutaneous Q4H Theodis Blaze, MD   3 Units at 04/30/13 0402  . insulin glargine (LANTUS) injection 10 Units  10 Units Subcutaneous QHS Michail Jewels, MD      .  mometasone-formoterol (DULERA) 100-5 MCG/ACT inhaler 2 puff  2 puff Inhalation BID Blain Pais, MD      . ondansetron [facility]) tablet 4 mg  4 mg Oral Q6H PRN Theodis Blaze, MD       Or  . ondansetron [facility]) injection 4 mg  4 mg Intravenous Q6H PRN Theodis Blaze, MD   4 mg at 04/30/13 0403  . pantoprazole (PROTONIX) EC tablet 40 mg  40 mg Oral Daily Blain Pais, MD      . sodium chloride 0.9 % injection 3 mL  3 mL Intravenous Q12H Theodis Blaze, MD        Prescriptions prior to admission  Medication Sig Dispense Refill  . albuterol (PROVENTIL HFA;VENTOLIN HFA) 108 (90 BASE) MCG/ACT inhaler Inhale 2 puffs into the lungs every 4 (four) hours as needed for wheezing or shortness of breath.      Marland Kitchen amitriptyline (ELAVIL) 25 MG tablet Take 2 tablets (50 mg total) by mouth at bedtime.  60 tablet  3  . amLODipine  (NORVASC) 10 MG tablet Take 1 tablet (10 mg total) by mouth daily.  30 tablet  6  . calcium carbonate (TUMS - DOSED IN MG ELEMENTAL CALCIUM) 500 MG chewable tablet Chew 1 tablet by mouth daily.      . Fluticasone-Salmeterol (ADVAIR) 100-50 MCG/DOSE AEPB Inhale 1 puff into the lungs every 12 (twelve) hours.  60 each  6  . gabapentin (NEURONTIN) 300 MG capsule Take 300 mg by mouth 3 (three) times daily.      . insulin aspart (NOVOLOG) 100 UNIT/ML injection Inject 2-12 Units into the skin 3 (three) times daily as needed for high blood sugar. Sliding scale- If sugar 150-199: inject 2 unit;  200-249: 4 units, 250-299: 7 units; 300-349: 10 units; Over 350: 12 units      . insulin glargine (LANTUS) 100 UNIT/ML injection Inject 0.02 mLs (2 Units total) into the skin at bedtime. Check sugar levels before meals and inject novolog as sliding scale listed below: If sugar 150-199: inject 2units; 200-249:inject 4 units; 250-299: 7 units; 300-349: 10units; Over 350: 12 units  10 mL  6  . omeprazole (PRILOSEC) 40 MG capsule Take 1 capsule (40 mg total) by mouth daily.  31 capsule  6  . oxyCODONE-acetaminophen (PERCOCET/ROXICET) 5-325 MG per tablet Take 1-2 tablets by mouth every 6 (six) hours as needed for moderate pain or severe pain.  24 tablet  0  . pregabalin (LYRICA) 75 MG capsule Take 1 capsule (75 mg total) by mouth 2 (two) times daily.  60 capsule  6  . promethazine (PHENERGAN) 25 MG tablet Take 1 tablet (25 mg total) by mouth every 6 (six) hours as needed for nausea or vomiting.  30 tablet  0    Allergies: Allergies as of 04/29/2013 - Review Complete 04/29/2013  Allergen Reaction Noted  . Lisinopril Nausea Only 03/17/2008  . Morphine and related  04/15/2013    PMH: Past Medical History  Diagnosis Date  . Thrombocytosis     Hem/Onc suggested 2/2 chronic hepatits and/or iron deficiency anemia  . Iron deficiency anemia   . N&V (nausea and vomiting)     Chronic. Unclear etiology with multiple  admission and ED visits. CT abdomen with and without contrast (02/2011)  showed no acute process. Gastic Emptying scan (01/2010) was normal. Ultrasound of the abdomen was within normal limits. Hepatitis B viral load was undectable. HIV NR. EGD - gastritis, Hpylori + s/p Rx  .  Abdominal pain   . Hypertension   . Hyperlipidemia   . Diabetes mellitus type 2, uncontrolled, with complications   . Recurrent boils   . Back pain   . OSA (obstructive sleep apnea)   . 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 Cath 12/10- no restenosis or significant CAD progression  . Irregular menses     Small ovarian follicles seen on HY(8/65)  . History of pyelonephritis     H/o GrpB Pyelonephritis (9/06) and UTI- 07/11- E.Coli, 12/10- GBS  . Abscess of tunica vaginalis     10/09- Abundant S. aureus- sensitive to all abx  . Obesity   . Asthma   . Gastritis   . Depression   . Hepatitis B, chronic     Hep BeAb+,Hep B cAb+ & Hep BsAg+ (9/06)  . Peripheral neuropathy   . Fibromyalgia   . GERD (gastroesophageal reflux disease)   . Migraine   . Anxiety   . Gastroparesis     secondary to poorly controlled DM, last emptying study performed 01/2010  was normal but may be falsely positive as pt was on reglan  . Blood dyscrasia   . H/O: CVA (cerebrovascular accident)     history of remote right cerebellar infarct noted on head CT at least since 10/2011    PSH: Past Surgical History  Procedure Laterality Date  . Cesarean section  1997    FH: Family History  Problem Relation Age of Onset  . Diabetes Father     SH: History  Substance Use Topics  . Smoking status: Former Smoker    Types: Cigarettes    Quit date: 04/24/1996  . Smokeless tobacco: Never Used     Comment: quit smoking cigarettes age 73  . Alcohol Use: Yes     Comment: occasionally    Review of Systems: Pertinent items are  noted in HPI.  Physical Exam: Blood pressure 177/106, pulse 115, temperature 98.5 F (36.9 C), temperature source Oral, resp. rate 20, height _0  (1.702 m), weight 97.206 kg (214 lb 4.8 oz), last menstrual period 04/29/2013, SpO2 100.00%.  Physical Exam  Constitutional: She is oriented to person, place, and time and well-developed, well-nourished, and in no distress. She appears not dehydrated.  Non-toxic appearance. She does not have a sickly appearance.  Pt has many visitors in the room.   HENT:  Head: Normocephalic and atraumatic.  Eyes: Conjunctivae and EOM are normal. Pupils are equal, round, and reactive to light.  Neck: Neck supple.  Cardiovascular: Normal rate, regular rhythm, normal heart sounds and intact distal pulses.  Exam reveals no gallop and no friction rub.   No murmur heard. Pulmonary/Chest: Effort normal and breath sounds normal. No respiratory distress. She has no wheezes. She has no rales.  Abdominal: Soft. Bowel sounds are normal. She exhibits no distension. There is tenderness in the left lower quadrant. There is no rigidity, no rebound, no guarding, no CVA tenderness and negative Murphy's sign. No hernia.  Neurological: She is alert and oriented to person, place, and time. No cranial nerve deficit.  Skin: Skin is warm and dry. No rash noted. No erythema.    Lab results:  Basic Metabolic Panel:  Recent Labs  04/29/13 0614 04/29/13 2108  NA 135* 139  K 4.0 3.8  CL 97 101  CO2 23 25  GLUCOSE 213* 210*  BUN 11 10  CREATININE 0.66  0.73  CALCIUM 9.3 9.1  MG  --  1.7   Anion Gap: 13  Liver Function Tests:  Recent Labs  04/29/13 0614 04/29/13 2108  AST 13 14  ALT 6 8  ALKPHOS 71 68  BILITOT 0.3 0.3  PROT 8.3 7.7  ALBUMIN 3.5 3.2*    Recent Labs  04/29/13 0614  LIPASE 27   No results found for this basename: AMMONIA,  in the last 72 hours  CBC:    Component Value Date/Time   WBC 16.5* 04/29/2013 2108   RBC 4.87 04/29/2013 2108   RBC  5.05 03/04/2011 0907   HGB 10.4* 04/29/2013 2108   HCT 31.9* 04/29/2013 2108   PLT 507* 04/29/2013 2108   MCV 65.5* 04/29/2013 2108   MCH 21.4* 04/29/2013 2108   MCHC 32.6 04/29/2013 2108   RDW 18.5* 04/29/2013 2108   LYMPHSABS 4.8* 04/29/2013 2108   MONOABS 1.3* 04/29/2013 2108   EOSABS 0.2 04/29/2013 2108   BASOSABS 0.0 04/29/2013 2108    Cardiac Enzymes: No results found for this basename: TROPIPOC,  in the last 72 hours Lab Results  Component Value Date   CKTOTAL 42 03/09/2011   CKMB 1.5 03/09/2011   TROPONINI <0.30 04/13/2013   CBG:  Recent Labs  04/29/13 0201 04/29/13 2203 04/30/13 0035 04/30/13 0116 04/30/13 0338  GLUCAP 200* 308* 131* 141* 235*    Hemoglobin A1C:  Recent Labs  04/29/13 2108  HGBA1C 12.7*    Lipid Panel: No results found for this basename: CHOL, HDL, LDLCALC, TRIG, CHOLHDL, LDLDIRECT,  in the last 72 hours  Thyroid Function Tests:  Recent Labs  04/29/13 2108  TSH 0.651    Anemia Panel: No results found for this basename: VITAMINB12, FOLATE, FERRITIN, TIBC, IRON, RETICCTPCT,  in the last 72 hours  Coagulation: No results found for this basename: LABPROT, INR,  in the last 72 hours  Urine Drug Screen: Drugs of Abuse:     Component Value Date/Time   LABOPIA POSITIVE* 04/29/2013 [place] 02/21/2010 [place] 04/29/2013 [place] 02/21/2010 2201   LABBENZ NONE DETECTED 04/29/2013 1309   LABBENZ NEGATIVE 02/21/2010 2201   AMPHETMU NONE DETECTED 04/29/2013 1309   AMPHETMU NEGATIVE 02/21/2010 2201   THCU NONE DETECTED 04/29/2013 1309   LABBARB NONE DETECTED 04/29/2013 1309    Alcohol Level: No results found for this basename: ETH,  in the last 72 hours  Urinalysis:  Recent Labs  04/29/13 [place] 1.025  PHURINE 6.0  GLUCOSEU >1000*  HGBUR NEGATIVE  BILIRUBINUR NEGATIVE  KETONESUR >80*  PROTEINUR NEGATIVE  UROBILINOGEN 1.0  NITRITE NEGATIVE  LEUKOCYTESUR MODERATE*     Imaging results:  No results found.  EKG:  Date/Time:    Ventricular Rate:    PR Interval:    QRS Duration:   QT Interval:    QTC Calculation:   R Axis:     Text Interpretation:    Antibiotics: Antibiotics Given (last 72 hours)   None      Anti-infectives   None      SIRS/Sepsis/Septic Shock criteria met: SIRS positive.    Consults:   Assessment & Plan by Problem: Principal Problem:   SIRS (systemic inflammatory response syndrome) Active Problems:   HYPERTENSION   Diabetes mellitus type 2, uncontrolled, with complications   Intractable vomiting   Dehydration   Weakness   Pt is a 38 y.o. female with a PMH of chronic N&V thought  to be secondary to gastroparesis (normal gastric emptying study 2011), chronic adominal pain, HTN, dyslipidemia, uncontrolled DM2, chronic back pain, OSA, CAD, (06/15/2006), pyelonephritis, asthma, anxiety/depression, hepatitis B, fibromyalgia, GERD, and CVA who presents to the [facility] ED with nausea and vomiting.     Patient Active Problem List   Diagnosis Date Noted  . SIRS (systemic inflammatory response syndrome) 04/30/2013  . Intractable vomiting 04/29/2013  . Dehydration 04/29/2013  . DKA (diabetic ketoacidoses) 04/29/2013  . Weakness 04/29/2013  . Dysmenorrhea 04/22/2013  . Vaginal yeast infection 04/22/2013  . Abdominal pain 04/12/2013  . UTI (urinary tract infection) 07/15/2012  . Diabetes mellitus type 2, uncontrolled, with complications   . Headache 02/08/2012  . Health care maintenance 01/22/2012  . Chronic hepatitis B 03/07/2011  . LEUKOCYTOSIS, CHRONIC 04/06/2010  . THROMBOCYTOSIS 04/06/2010  . NAUSEA AND VOMITING 04/06/2010  . SUBSTANCE ABUSE, MULTIPLE 02/23/2010  . MICROCYTIC ANEMIA 11/22/2009  . PERIPHERAL NEUROPATHY 10/01/2009  . HYPERLIPIDEMIA 08/30/2009  . POLYNEUROPATHY, DIABETIC 08/30/2009  . Hidradenitis (recurrent boils) 07/07/2008  . DEPRESSION 12/27/2007  . Abdominal pain, left lower quadrant  11/21/2007  . FIBROMYALGIA 10/30/2007  . BACK PAIN 04/01/2007  . OBSTRUCTIVE SLEEP APNEA 01/17/2007  . ANXIETY DEPRESSION 06/27/2006  . CORONARY ARTERY DISEASE, S/P PTCA 06/15/2006  . OBESITY, MORBID 05/15/2006  . MIGRAINE HEADACHE 05/15/2006  . ASTHMA 05/15/2006  . Poorly controlled type 2 diabetes mellitus 01/16/2006  . HYPERTENSION 01/16/2006  . IRREGULAR MENSTRUATION 01/16/2006  . PEDAL EDEMA 01/16/2006     Problem List Items Addressed This Visit   Dehydration    Other Visit Diagnoses   Intractable nausea and vomiting    -  Primary    Chronic abdominal pain        Relevant Medications       fentaNYL (SUBLIMAZE) injection 50 mcg (Completed)       morphine 4 MG/ML injection 4 mg (Completed)       HYDROmorphone (DILAUDID) injection 1 mg (Completed)       HYDROmorphone (DILAUDID) injection 1 mg (Completed)       acetaminophen (TYLENOL) tablet 650 mg       acetaminophen (TYLENOL) suppository 650 mg       gabapentin (NEURONTIN) capsule 300 mg (Start on 04/30/2013 10:00 AM)       amitriptyline (ELAVIL) tablet 50 mg (Start on 04/30/2013 10:00 PM)       HYDROmorphone (DILAUDID) injection 0.5 mg (Completed)       #Chronic nausea/vomiting-  Etiology unclear.  Pt admitted multiple times for the same complaint.  Today's admission, I spoke with RN at [facility] and she had  no N/V since on the floor.  Labs do not suggest complications of vomiting such as dehydration, hypokalemia, metabolic alkalosis.  Lipase 27.  UA showed leukocytes but microscopic showed rare bacteria so doubt UTI and pt asymptomatic.  Additional possibilities include GERD, medications (gabapentin), cyclic vomiting syndrome, rumination syndrome, vestibular neuritis (pt reports vertigo symptoms but doubt this is etiology).      03/02/2010 EGD by Dr. Fuller Plan revealed mild gastritis and no cause for symptoms.  She was to f/u with him but was a no show.  According to clinic notes she had an appointment with GI on 04/30/12.   Addendum: RN  stated pt was eating french fries and a cheeseburger and became nauseated without vomiting.   -GI consult (pt has appt today with Dr. Benson Norway at 9:30) -NPO  -urine pregnancy -zofran PRN -protonix 8m daily -strict orders for nursing to document any  episodes of vomiting -consider d/c medications that may be contributing   #Chronic left lower quadrant adominal pain- Stable; unlikely related to problem above.  Most likely multifactorial.  Pt has fibromyalgia which is likely contributing; however, pt appears to have uterine fibroids (pt with heavy periods) on CT of 08/15/12 which may also be contributing.  Additional possibilities include endometriosis (pt has heavy menstrual periods lasting approx 7 days), interstitial cystitis as pt has urinary frequency without dysuria.  Pt d/c on 04/15/13 for same complaint with extensive workup including ct abd/pelvis showed no acute abdominal process. Urine preg negative, lipase- normal at 30.  Pt with leukocytosis 15.8. UA with multiple bacteria, Urine cx positive for strep agalactiae, >100 000 colonies. Pt started on 3 day course of ampicillin at 578m Q6H and pelvic exam reavealed no pelvic palpable pelvic mass, wet prep/GC/chlamydia negative. She stated she completed two additional days of required ampicillin upon d/c.   -gynecology referral (referral put in by [facility]  #DM2, uncontrolled- HA1c 12.7 -lipid panel -SSI -lantus 10 units at bedtime -diabetes education  #SIRS- Stable.  Pt does not appear acutely ill.  Initially met SIRS criteria but also anxiety which may be contributing to tachycardia.   -monitor  #HTN- Stable.  -continue home meds    # Anxiety/depression- Stable; this may be playing a role in her chronic abdominal pain.   -continue home meds  #FEN- NS-750mhr  Electrolytes-monitor and replete as needed  Diet-Regular    #VTE prophylaxis- lovenox 4035mQ qd   #Dispo- Disposition is deferred at this time, awaiting improvement of current  medical problems.  Anticipated discharge in approximately 1-2 day(s).    Emergency Contact: Contact Information   Name Relation Home Work MobLitchfield Parkher [phone]     The patient does have a current PCP (KaMayra ReelSBeverly SessionsD) and [facility].  Signed: JacMichail JewelsD PGY-1, Internal Medicine  04/30/2013, 6:07 AM

## 2013-04-29 NOTE — Progress Notes (Signed)
PCP on call from [facility] admissions asked RN to notify floor coverage of patient's statement of not having eaten in 3 days. Will attempt to notify PCP on call.

## 2013-04-29 NOTE — Progress Notes (Signed)
Patient received to unit.  Telemetry monitor placed on patient and confirmed with CMT.  Vitals done, stable.  Will report off to night RN.

## 2013-04-29 NOTE — ED Notes (Signed)
Patient is alert and oriented x3.  She is complaining of nausea and vomiting with generalized body aches over 3 days.

## 2013-04-30 DIAGNOSIS — I10 Essential (primary) hypertension: Secondary | ICD-10-CM

## 2013-04-30 DIAGNOSIS — D473 Essential (hemorrhagic) thrombocythemia: Secondary | ICD-10-CM

## 2013-04-30 DIAGNOSIS — J45909 Unspecified asthma, uncomplicated: Secondary | ICD-10-CM

## 2013-04-30 DIAGNOSIS — D509 Iron deficiency anemia, unspecified: Secondary | ICD-10-CM

## 2013-04-30 DIAGNOSIS — R6510 Systemic inflammatory response syndrome (SIRS) of non-infectious origin without acute organ dysfunction: Secondary | ICD-10-CM | POA: Diagnosis present

## 2013-04-30 DIAGNOSIS — E119 Type 2 diabetes mellitus without complications: Secondary | ICD-10-CM

## 2013-04-30 DIAGNOSIS — IMO0001 Reserved for inherently not codable concepts without codable children: Secondary | ICD-10-CM

## 2013-04-30 DIAGNOSIS — D72829 Elevated white blood cell count, unspecified: Secondary | ICD-10-CM

## 2013-04-30 LAB — GLUCOSE, CAPILLARY
GLUCOSE-CAPILLARY: 131 mg/dL — AB (ref 70–99)
GLUCOSE-CAPILLARY: 141 mg/dL — AB (ref 70–99)
GLUCOSE-CAPILLARY: 252 mg/dL — AB (ref 70–99)
Glucose-Capillary: 235 mg/dL — ABNORMAL HIGH (ref 70–99)
Glucose-Capillary: 219 mg/dL — ABNORMAL HIGH (ref 70–99)
Glucose-Capillary: 188 mg/dL — ABNORMAL HIGH (ref 70–99)
Glucose-Capillary: 291 mg/dL — ABNORMAL HIGH (ref 70–99)

## 2013-04-30 LAB — BASIC METABOLIC PANEL
BUN: 9 mg/dL (ref 6–23)
CALCIUM: 8.7 mg/dL (ref 8.4–10.5)
CO2: 23 mEq/L (ref 19–32)
CREATININE: 0.79 mg/dL (ref 0.50–1.10)
Chloride: 101 mEq/L (ref 96–112)
GFR calc non Af Amer: >90 mL/min (ref >90)
Glucose, Bld: 285 mg/dL — ABNORMAL HIGH (ref 70–99)
Potassium: 3.9 mEq/L (ref 3.7–5.3)
Sodium: 136 mEq/L — ABNORMAL LOW (ref 137–147)

## 2013-04-30 LAB — URINE CULTURE
CULTURE: NO GROWTH
Colony Count: NO GROWTH

## 2013-04-30 LAB — TSH: TSH: 0.651 u[IU]/mL (ref 0.350–4.500)

## 2013-04-30 LAB — HEMOGLOBIN A1C
Hgb A1c MFr Bld: 12.7 % — ABNORMAL HIGH (ref <5.7)
MEAN PLASMA GLUCOSE: 318 mg/dL — AB (ref <117)

## 2013-04-30 MED ORDER — AMITRIPTYLINE HCL 50 MG PO TABS
50.0000 mg | ORAL_TABLET | Freq: Every day | ORAL | Status: DC
  Administered 2013-04-30: 50 mg via ORAL
  Filled 2013-04-30 (×2): qty 1

## 2013-04-30 MED ORDER — MOMETASONE FURO-FORMOTEROL FUM 100-5 MCG/ACT IN AERO
2.0000 | INHALATION_SPRAY | Freq: Two times a day (BID) | RESPIRATORY_TRACT | Status: DC
  Administered 2013-05-01 (×2): 2 via RESPIRATORY_TRACT
  Filled 2013-04-30 (×2): qty 8.8

## 2013-04-30 MED ORDER — ALBUTEROL SULFATE (2.5 MG/3ML) 0.083% IN NEBU: 2.5000 mg | INHALATION_SOLUTION | RESPIRATORY_TRACT | Status: DC | PRN

## 2013-04-30 MED ORDER — ALBUTEROL SULFATE HFA 108 (90 BASE) MCG/ACT IN AERS: 2.0000 | INHALATION_SPRAY | RESPIRATORY_TRACT | Status: DC | PRN

## 2013-04-30 MED ORDER — HYDROMORPHONE HCL PF 1 MG/ML IJ SOLN
0.5000 mg | Freq: Once | INTRAMUSCULAR | Status: AC
  Administered 2013-04-30: 0.5 mg via INTRAVENOUS
  Filled 2013-04-30: qty 1

## 2013-04-30 MED ORDER — GABAPENTIN 300 MG PO CAPS
300.0000 mg | ORAL_CAPSULE | Freq: Three times a day (TID) | ORAL | Status: DC
  Administered 2013-04-30 – 2013-05-01 (×5): 300 mg via ORAL
  Filled 2013-04-30 (×6): qty 1

## 2013-04-30 MED ORDER — OXYCODONE-ACETAMINOPHEN 5-325 MG PO TABS
1.0000 | ORAL_TABLET | Freq: Four times a day (QID) | ORAL | Status: DC | PRN
  Administered 2013-05-01 (×3): 2 via ORAL
  Filled 2013-04-30 (×4): qty 2

## 2013-04-30 MED ORDER — PANTOPRAZOLE SODIUM 40 MG PO TBEC
40.0000 mg | DELAYED_RELEASE_TABLET | Freq: Every day | ORAL | Status: DC
  Administered 2013-04-30 – 2013-05-01 (×2): 40 mg via ORAL
  Filled 2013-04-30 (×2): qty 1

## 2013-04-30 MED ORDER — SODIUM CHLORIDE 0.9 % IV SOLN
INTRAVENOUS | Status: DC
  Administered 2013-04-30: 75 mL/h via INTRAVENOUS
  Administered 2013-05-01: 02:00:00 via INTRAVENOUS

## 2013-04-30 MED ORDER — HYDROMORPHONE HCL PF 1 MG/ML IJ SOLN
1.0000 mg | Freq: Once | INTRAMUSCULAR | Status: AC
  Administered 2013-04-30: 1 mg via INTRAVENOUS
  Filled 2013-04-30: qty 1

## 2013-04-30 MED ORDER — INSULIN GLARGINE 100 UNIT/ML ~~LOC~~ SOLN
10.0000 [IU] | Freq: Every day | SUBCUTANEOUS | Status: DC
  Administered 2013-04-30: 10 [IU] via SUBCUTANEOUS
  Filled 2013-04-30 (×2): qty 0.1

## 2013-04-30 MED ORDER — ONDANSETRON HCL 4 MG/2ML IJ SOLN
4.0000 mg | Freq: Once | INTRAMUSCULAR | Status: AC
  Administered 2013-04-30: 4 mg via INTRAVENOUS
  Filled 2013-04-30: qty 2

## 2013-04-30 MED ORDER — AMLODIPINE BESYLATE 10 MG PO TABS
10.0000 mg | ORAL_TABLET | Freq: Every day | ORAL | Status: DC
  Administered 2013-04-30 – 2013-05-01 (×2): 10 mg via ORAL
  Filled 2013-04-30 (×2): qty 1

## 2013-04-30 NOTE — Progress Notes (Signed)
Inpatient Diabetes Program Recommendations  AACE/ADA: New Consensus Statement on Inpatient Glycemic Control (2013)  Target Ranges:  Prepandial:   less than 140 mg/dL      Peak postprandial:   less than 180 mg/dL (1-2 hours)      Critically ill patients:  140 - 180 mg/dL     Results for Shirley [name], Shirley [name] (MRN [id number]) as of 04/30/2013 11:49  Ref. Range 04/30/2013 03:38 04/30/2013 07:41  Glucose-Capillary Latest Range: 70-99 mg/dL 235 (H) 252 (H)    **Patient admitted with N&V and abdominal pain.  Has history of DM but does not adhere to her home insulin regimen.  Noted from H&P that patient also refuses to check CBGs regularly at home.    **Noted also that patient tested Positive for cocaine in February and December of 2014.  **Patient's PCP is Dr. Michail Sermon with the Internal Medicine clinic.  Was seen by Dr. Michail Sermon on 04/22/13.   **Attempted to speak with patient regarding her poor DM control at home.  Patient had all the lights off in the room and the blinds drawn closed.  Patient would barely speak to me.  Attempted to ask patient questions about her home DM care regimen, however, patient appeared very disinterested and would not make eye contact with me.  Patient lying on side complaining of abdominal pain during my visit.  Will alert RN to my visit with patient.  It appears as if (through review of previous records) that patient has [place] interest in caring for her DM at home.  **Not sure if follow up with Debera Lat, CDE in the Internal Medicine clinic would be helpful.     Will follow. Wyn Quaker RN, MSN, CDE Diabetes Coordinator Inpatient Diabetes Program Team Pager: [phone] (8a-10p)

## 2013-04-30 NOTE — Progress Notes (Signed)
[facility] transported the patient on a stretcher to [facility]

## 2013-04-30 NOTE — ED Provider Notes (Signed)
Medical screening examination/treatment/procedure(s) were performed by non-physician practitioner and as supervising physician I was immediately available for consultation/collaboration.  EKG Interpretation    Date/Time:    Ventricular Rate:    PR Interval:    QRS Duration:   QT Interval:    QTC Calculation:   R Axis:     Text Interpretation:               Reilley Latorre Alfonso Patten, MD 04/30/13 0202

## 2013-04-30 NOTE — Progress Notes (Signed)
Patient did not c/o of  Nausea or vomiting.

## 2013-04-30 NOTE — Progress Notes (Addendum)
Subjective:  Pt seen and examined. No acute events overnight. Pt reports she continues to be nauseous without vomiting. Her chronic LLQ abdominal pain is stable. No fever, chills, headache, dyspnea, cough, chest pain, LE edema, change in BM or urination. Pt reports dilaudid works best to control her pain.   Objective: Vital signs in last 24 hours: Filed Vitals:   04/30/13 0115 04/30/13 0603 04/30/13 0700 04/30/13 1225  BP: 117/82 177/106 175/105 115/79  Pulse: 96 115  95  Temp: 97.7 F (36.5 C) 98.5 F (36.9 C)  98.5 F (36.9 C)  TempSrc: Oral Oral  Oral  Resp: 20 20  17   Height:      Weight:      SpO2: 100% 100%  100%   Weight change:   Intake/Output Summary (Last 24 hours) at 04/30/13 1425 Last data filed at 04/30/13 1226  Gross per 24 hour  Intake   1005 ml  Output   1500 ml  Net   -495 ml   Constitutional: NAD Head: Normocephalic   Eyes:  EOMI   Neck: Neck supple  Cardiovascular: Normal rate, regular rhythm, normal heart sounds   Pulmonary/Chest: Effort normal and breath sounds normal. No respiratory distress. She has no wheezes. She has no rales.  Abdominal: Soft. Bowel sounds are normal. She exhibits no distension. There is mild tenderness in the left lower quadrant. There is no rigidity, no rebound, or guarding.  Neurological: She is alert and oriented to person, place, and time  Skin: Skin is warm and dry. No rash noted. No erythema.   Lab Results: Basic Metabolic Panel:  Recent Labs Lab 04/29/13 2108 04/30/13 0805  NA 139 136*  K 3.8 3.9  CL 101 101  CO2 25 23  GLUCOSE 210* 285*  BUN 10 9  CREATININE 0.73 0.79  CALCIUM 9.1 8.7  MG 1.7  --    Liver Function Tests:  Recent Labs Lab 04/29/13 0614 04/29/13 2108  AST 13 14  ALT 6 8  ALKPHOS 71 68  BILITOT 0.3 0.3  PROT 8.3 7.7  ALBUMIN 3.5 3.2*    Recent Labs Lab 04/29/13 0614  LIPASE 27   No results found for this basename: AMMONIA,  in the last 168 hours CBC:  Recent Labs Lab  04/29/13 0614 04/29/13 2108  WBC 14.6* 16.5*  NEUTROABS 12.4* 10.2*  HGB 11.2* 10.4*  HCT 33.6* 31.9*  MCV 64.9* 65.5*  PLT 486* 507*   Cardiac Enzymes: No results found for this basename: CKTOTAL, CKMB, CKMBINDEX, TROPONINI,  in the last 168 hours BNP: No results found for this basename: PROBNP,  in the last 168 hours D-Dimer: No results found for this basename: DDIMER,  in the last 168 hours CBG:  Recent Labs Lab 04/29/13 2203 04/30/13 0035 04/30/13 0116 04/30/13 0338 04/30/13 0741 04/30/13 1224  GLUCAP 308* 131* 141* 235* 252* 219*   Hemoglobin A1C:  Recent Labs Lab 04/29/13 2108  HGBA1C 12.7*   Fasting Lipid Panel: No results found for this basename: CHOL, HDL, LDLCALC, TRIG, CHOLHDL, LDLDIRECT,  in the last 168 hours Thyroid Function Tests:  Recent Labs Lab 04/29/13 2108  TSH 0.651   Coagulation: No results found for this basename: LABPROT, INR,  in the last 168 hours Anemia Panel: No results found for this basename: VITAMINB12, FOLATE, FERRITIN, TIBC, IRON, RETICCTPCT,  in the last 168 hours Urine Drug Screen: Drugs of Abuse     Component Value Date/Time   LABOPIA POSITIVE* 04/29/2013 [place]  02/21/2010 2201   COCAINSCRNUR NONE DETECTED 04/29/2013 1309   COCAINSCRNUR NEGATIVE 02/21/2010 2201   LABBENZ NONE DETECTED 04/29/2013 1309   LABBENZ NEGATIVE 02/21/2010 2201   AMPHETMU NONE DETECTED 04/29/2013 1309   AMPHETMU NEGATIVE 02/21/2010 2201   THCU NONE DETECTED 04/29/2013 1309   LABBARB NONE DETECTED 04/29/2013 1309    Alcohol Level: No results found for this basename: ETH,  in the last 168 hours Urinalysis:  Recent Labs Lab 04/29/13 1032  COLORURINE YELLOW  LABSPEC 1.025  PHURINE 6.0  GLUCOSEU >1000*  HGBUR NEGATIVE  BILIRUBINUR NEGATIVE  KETONESUR >80*  PROTEINUR NEGATIVE  UROBILINOGEN 1.0  NITRITE NEGATIVE  LEUKOCYTESUR MODERATE*    Micro Results: Recent Results (from the past 240 hour(s))  URINE CULTURE     Status:  None   Collection Time    04/29/13 10:32 AM      Result Value Range Status   Specimen Description URINE, RANDOM   Final   Special Requests NONE   Final   Culture  Setup Time     Final   Value: 04/29/2013 15:07     Performed at [facility] Count     Final   Value: NO GROWTH     Performed at [facility]   Culture     Final   Value: NO GROWTH     Performed at [facility]   Report Status 04/30/2013 FINAL   Final   Studies/Results: No results found. Medications: I have reviewed the patient's current medications. Scheduled Meds: . amitriptyline  50 mg Oral QHS  . amLODipine  10 mg Oral Daily  . enoxaparin (LOVENOX) injection  50 mg Subcutaneous Q24H  . gabapentin  300 mg Oral TID  . insulin aspart  0-9 Units Subcutaneous Q4H  . insulin glargine  10 Units Subcutaneous QHS  . mometasone-formoterol  2 puff Inhalation BID  . pantoprazole  40 mg Oral Daily  . sodium chloride  3 mL Intravenous Q12H   Continuous Infusions: . sodium chloride 75 mL/hr (04/30/13 0403)   PRN Meds:.acetaminophen, acetaminophen, albuterol, ondansetron (ZOFRAN) IV, ondansetron Assessment/Plan:  Plan:  Acute on Chronic Nausea and Vomiting- Pt with history of chronic nausea and vomiting of unknown etiology with extensive past work-up. Last gastric emptying study in 2010 was normal, however now with worsening DM and in setting of chronic marijuana use may be due to gastroparesis vs pain seeking behavior.      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 Elnoria Howard) recommendations start IV erythromycin 200 mg BID or TID -Continue home tylenol 650 mg PRN, 1-2 percocet/roxicet 5-325 mg PRN pain   -Continue PO zofran 4 mg PRN nausea   -Continue PO protonix 40 mg daily for acid reflux (pt on  omeprazole 40 mg daily)  -Obtain lipid panel, urine pregnancy test, H. Pylori stool Ag  -Will need outpatient GI follow-up with Dr. Elnoria Howard  LLQ abdominal pain - currently stable without peritoneal signs.  Pt with work-up in past with no etiology identified. -Outpatient follow-up  Insulin Dependent Type II DM - HbA1c 12.7 on 1/6. Pt with unknown insulin regimen  on 2U Lantus (?) and ISS at home  -Lantus 10 U daily, increase as needed if uncontrolled  -Insulin sliding scale with meals  -Monitor glucose frequently  Hypertension - currently normotensive  -Continue amlodipine 10 mg daily   Leukocytosis - Pt with chronic leukocytosis of unknown etiology. No infectious source identified, possibly inflammatory in nature vs bone marrow disease considering anemia and thrombocytosis.  -Continue to monitor CBC -Continue to monitor for infection  Thrombocytosis - Most likely reactive due to microcytic anemia  -Continue to monitor CBC  Asthma - currently without exacerbation. Pt on advair daily and albuterol rescue inhaler at home -Continue dulera daily   Microcytic Anemia - currently stable with no active bleeding. Baseline Hg 10-11 -Continue to monitor for bleeding   Fibromyalgia - Pt on amitriptyline, gabapentin, and pregabalin at home  -Continue home gabapentin 300 mg TID -Continue home amitriptyline 50 mg daily  -Hold pregabalin 75 mg daily    Diet: NPO after midnight DVT Ppx: Lovenox Code: Full   Dispo: Disposition is deferred at this time, awaiting improvement of current medical problems.  Anticipated discharge in approximately 1 day(s).   The patient does have a current PCP Mayra Reel Beverly Sessions, MD) and does need an [facility] hospital follow-up appointment after discharge.  The patient does not have transportation limitations that hinder transportation to clinic appointments.  .Services Needed at time of discharge: Y = Yes, Blank = No PT:   OT:   RN:   Equipment:   Other:      LOS: 1 day   Juluis Mire, MD 04/30/2013, 2:25 PM

## 2013-04-30 NOTE — H&P (Signed)
  Date: 04/30/2013  Patient name: Shirley [name]  Medical record number: [id number]  Date of birth: [birth year]   I have seen and evaluated [place] and discussed their care with the Residency Team.  Briefly, Ms. Stigall is a 38yo woman with PMH of N/V and LLQ abdominal pain that have not fully been explained by work up.  She was previously admitted in December for the same issues.  She is followed by GI, Dr. Benson Norway.  She had been diagnosed with H.pylori about 2 years ago and completed 2 cycles of treatment.  Since that time, she reports these cycles of N/V.  She reports that this time she started getting nauseated and vomiting about 3 days ago with non bilious fluid.  She notes that prior to that she had not eaten for that day or the evening prior.  When seen by the admitting team, she was feeling better and ate some chicken nuggets from [employer], afterwards she did again become nauseated.  She notes that the symptoms are not necessarily connected to food, however, the last 2 episodes have surrounded indiscretions in diet.  She has uncontrolled DM which may be contributing.  She had a GES in 2010 which was normal.    Assessment and Plan: I have seen and evaluated the patient as outlined above. I agree with the formulated Assessment and Plan as detailed in the residents' admission note, with the following changes:   1. Acute on Chronic N/V: After eating a fatty meal, she has again become nauseated without vomiting.  She will be NPO, zofran and protonix ordered.  Will repeat GES as she has had very difficult to control DM since the last study.  Other possibilities include IBS, cyclical vomiting syndrome, etc (some of which are listed in Dr. Ferd Glassing note).  We will also check for H.pylori in the stool as persistent infection could be the cause (she is reported to be on a PPI at home, however, she has not apparently been taking meds over last few days).  Will reschedule outpatient GI follow up.  She is  currently requiring IVF and tolerating only IV medications.   2. Leukocytosis: No acute signs of infection, relatively normal diff with relative number normal but absolute neutrophils elevated.  Unclear if she could have a smoldering infection (H.pylori?).  Please note above work up.   3. DM: SSI, lantus and education.  Controlling this disease may help her chronic issues.   4. LLQ abdominal pain: Worse with nausea, unclear cause and has been evaluated with CT scan recently.  Continue outpatient follow up.  Gynecology follow up.   Other issues per resident note.   Sid Falcon, MD 1/7/20151:27 PM

## 2013-04-30 NOTE — Progress Notes (Signed)
Patient was received from [facility] via EMS.  When patient arrived she had no complaints of  Nausea.  Patient voiced that she was hungry and she was going to eat regardless of her NPO diet orders. She called her dtr to bring a large chicken nugget,  fries and soda.  After eating pt then voiced that she was nauseated.  She had scant amount of emesis.  She  c/o right side pain. Also Shaking her legs in a nervous manner. MD was notified and she was medicated. Gordy Levan, MD came up to see her and made changed to orders.  Tele was D/C and diet and pain medication changed.  Reported above to coming nurse and increased BP reported to oncoming nurse for follow up.

## 2013-04-30 NOTE — Progress Notes (Signed)
[facility] notified RN that they were on their way to [facility]

## 2013-05-01 ENCOUNTER — Encounter: Payer: Self-pay | Admitting: Internal Medicine

## 2013-05-01 LAB — GLUCOSE, CAPILLARY
GLUCOSE-CAPILLARY: 111 mg/dL — AB (ref 70–99)
GLUCOSE-CAPILLARY: 98 mg/dL (ref 70–99)
GLUCOSE-CAPILLARY: 104 mg/dL — AB (ref 70–99)
Glucose-Capillary: 157 mg/dL — ABNORMAL HIGH (ref 70–99)
Glucose-Capillary: 318 mg/dL — ABNORMAL HIGH (ref 70–99)
Glucose-Capillary: 335 mg/dL — ABNORMAL HIGH (ref 70–99)

## 2013-05-01 LAB — CBC
HEMATOCRIT: 28.5 % — AB (ref 36.0–46.0)
HEMOGLOBIN: 9.2 g/dL — AB (ref 12.0–15.0)
MCH: 21.2 pg — AB (ref 26.0–34.0)
MCHC: 32.3 g/dL (ref 30.0–36.0)
MCV: 65.7 fL — ABNORMAL LOW (ref 78.0–100.0)
Platelets: 428 10*3/uL — ABNORMAL HIGH (ref 150–400)
RBC: 4.34 MIL/uL (ref 3.87–5.11)
RDW: 18.3 % — ABNORMAL HIGH (ref 11.5–15.5)
WBC: 10.4 10*3/uL (ref 4.0–10.5)

## 2013-05-01 MED ORDER — INSULIN GLARGINE 100 UNIT/ML ~~LOC~~ SOLN: 40.0000 [IU] | Freq: Every day | SUBCUTANEOUS | Status: DC

## 2013-05-01 MED ORDER — PREGABALIN 75 MG PO CAPS
75.0000 mg | ORAL_CAPSULE | Freq: Two times a day (BID) | ORAL | Status: DC
  Administered 2013-05-01: 75 mg via ORAL
  Filled 2013-05-01: qty 1

## 2013-05-01 MED ORDER — WHITE PETROLATUM GEL
Status: AC
  Administered 2013-05-01: 0.2
  Filled 2013-05-01: qty 5

## 2013-05-01 MED ORDER — OXYCODONE-ACETAMINOPHEN 5-325 MG PO TABS: 1.0000 | ORAL_TABLET | Freq: Two times a day (BID) | ORAL | Status: DC

## 2013-05-01 MED ORDER — HYDROCORTISONE 0.5 % EX CREA
TOPICAL_CREAM | Freq: Two times a day (BID) | CUTANEOUS | Status: DC
  Administered 2013-05-01: 16:00:00 via TOPICAL
  Filled 2013-05-01: qty 28.35

## 2013-05-01 NOTE — Discharge Instructions (Signed)
-  Take your medications as before -Please attend your GI follow-up appointment --if you cannot attend please call to cancel as you will not be able to reschedule another appt

## 2013-05-01 NOTE — Progress Notes (Signed)
Nursing Note: Reviewed discharge instructions w/pt per policy and pt able to verbalize understanding of d/c instructions.Able to state understanding.Copy provided for pt and all reviewed page by page and pt had no questions.Pt d/c to home.wbb

## 2013-05-01 NOTE — Progress Notes (Signed)
Case discussed with Dr. Schooler soon after the resident saw the patient.  We reviewed the resident's history and exam and pertinent patient test results.  I agree with the assessment, diagnosis, and plan of care documented in the resident's note. 

## 2013-05-01 NOTE — Progress Notes (Signed)
Patient is requesting her home medication Lyrica 75 mg twice per day.   She is also concerned regarding her hairline in front of her head.  She stated that it was dry and itching during the day of 04/30/13.  She peeled off the scab and scratched it now the area is pink.  She thinks it may be related to a medication.  I assessed and only saw this pinkness in one localized area.  None anywhere else on her head and body.  Patient also stated the Percocets are not relieving her pain.  I provided Tylenol PO and Zofran IV for her nausea in addition to the Percocets. I explained to patient that IV pain medication will not be provided at this time.

## 2013-05-01 NOTE — Progress Notes (Signed)
Subjective:  Pt seen and examined. No acute events overnight. She reports still feeling nauseous but able to tolerate PO intake yesterday. She continues to have chronic LLQ abdominal pain.  She reports having a egg and oatmeal allergy and could not tolerate gastric emptying study yesterday. Pt to have follow-up with GI for further evaluation.   Objective: Vital signs in last 24 hours: Filed Vitals:   04/30/13 1225 04/30/13 1656 04/30/13 2115 05/01/13 0530  BP: 115/79 109/69 114/76 109/57  Pulse: 95 94 91 82  Temp: 98.5 F (36.9 C) 97.7 F (36.5 C) 98.9 F (37.2 C) 98 F (36.7 C)  TempSrc: Oral Oral Oral   Resp: 17 17 19 17   Height:      Weight:      SpO2: 100% 100% 99% 100%   Weight change:   Intake/Output Summary (Last 24 hours) at 05/01/13 0936 Last data filed at 05/01/13 0730  Gross per 24 hour  Intake   1580 ml  Output   1400 ml  Net    180 ml   Constitutional: NAD Head: Normocephalic   Eyes:  EOMI   Neck: Neck supple  Cardiovascular: Normal rate, regular rhythm, normal heart sounds   Pulmonary/Chest: Effort normal and breath sounds normal. No respiratory distress. She has no wheezes. She has no rales.  Abdominal: Soft. Bowel sounds are normal. She exhibits no distension. There is mild tenderness in the left lower quadrant. There is no rigidity, no rebound, or guarding.  Neurological: She is alert and oriented to person, place, and time  Skin: Skin is warm and dry. No rash noted. No erythema.   Lab Results: Basic Metabolic Panel:  Recent Labs Lab 04/29/13 2108 04/30/13 0805  NA 139 136*  K 3.8 3.9  CL 101 101  CO2 25 23  GLUCOSE 210* 285*  BUN 10 9  CREATININE 0.73 0.79  CALCIUM 9.1 8.7  MG 1.7  --    Liver Function Tests:  Recent Labs Lab 04/29/13 0614 04/29/13 2108  AST 13 14  ALT 6 8  ALKPHOS 71 68  BILITOT 0.3 0.3  PROT 8.3 7.7  ALBUMIN 3.5 3.2*    Recent Labs Lab 04/29/13 0614  LIPASE 27   No results found for this basename:  AMMONIA,  in the last 168 hours CBC:  Recent Labs Lab 04/29/13 0614 04/29/13 2108 05/01/13 0540  WBC 14.6* 16.5* 10.4  NEUTROABS 12.4* 10.2*  --   HGB 11.2* 10.4* 9.2*  HCT 33.6* 31.9* 28.5*  MCV 64.9* 65.5* 65.7*  PLT 486* 507* 428*   Cardiac Enzymes: No results found for this basename: CKTOTAL, CKMB, CKMBINDEX, TROPONINI,  in the last 168 hours BNP: No results found for this basename: PROBNP,  in the last 168 hours D-Dimer: No results found for this basename: DDIMER,  in the last 168 hours CBG:  Recent Labs Lab 04/30/13 1224 04/30/13 1653 04/30/13 2003 04/30/13 2358 05/01/13 0407 05/01/13 0756  GLUCAP 219* 188* 291* 335* 111* 157*   Hemoglobin A1C:  Recent Labs Lab 04/29/13 2108  HGBA1C 12.7*   Fasting Lipid Panel: No results found for this basename: CHOL, HDL, LDLCALC, TRIG, CHOLHDL, LDLDIRECT,  in the last 168 hours Thyroid Function Tests:  Recent Labs Lab 04/29/13 2108  TSH 0.651   Coagulation: No results found for this basename: LABPROT, INR,  in the last 168 hours Anemia Panel: No results found for this basename: VITAMINB12, FOLATE, FERRITIN, TIBC, IRON, RETICCTPCT,  in the last 168 hours Urine  Drug Screen: Drugs of Abuse     Component Value Date/Time   LABOPIA POSITIVE* 04/29/2013 1309   LABOPIA NEGATIVE 02/21/2010 2201   COCAINSCRNUR NONE DETECTED 04/29/2013 1309   COCAINSCRNUR NEGATIVE 02/21/2010 2201   LABBENZ NONE DETECTED 04/29/2013 1309   LABBENZ NEGATIVE 02/21/2010 2201   AMPHETMU NONE DETECTED 04/29/2013 1309   AMPHETMU NEGATIVE 02/21/2010 2201   THCU NONE DETECTED 04/29/2013 1309   LABBARB NONE DETECTED 04/29/2013 1309    Alcohol Level: No results found for this basename: ETH,  in the last 168 hours Urinalysis:  Recent Labs Lab 04/29/13 1032  COLORURINE YELLOW  LABSPEC 1.025  PHURINE 6.0  GLUCOSEU >1000*  HGBUR NEGATIVE  BILIRUBINUR NEGATIVE  KETONESUR >80*  PROTEINUR NEGATIVE  UROBILINOGEN 1.0  NITRITE NEGATIVE  LEUKOCYTESUR  MODERATE*    Micro Results: Recent Results (from the past 240 hour(s))  URINE CULTURE     Status: None   Collection Time    04/29/13 10:32 AM      Result Value Range Status   Specimen Description URINE, RANDOM   Final   Special Requests NONE   Final   Culture  Setup Time     Final   Value: 04/29/2013 15:07     Performed at [facility]     Final   Value: NO GROWTH     Performed at [facility]   Culture     Final   Value: NO GROWTH     Performed at [facility]   Report Status 04/30/2013 FINAL   Final   Studies/Results: No results found. Medications: I have reviewed the patient's current medications. Scheduled Meds: . amitriptyline  50 mg Oral QHS  . amLODipine  10 mg Oral Daily  . enoxaparin (LOVENOX) injection  50 mg Subcutaneous Q24H  . gabapentin  300 mg Oral TID  . insulin aspart  0-9 Units Subcutaneous Q4H  . insulin glargine  10 Units Subcutaneous QHS  . mometasone-formoterol  2 puff Inhalation BID  . pantoprazole  40 mg Oral Daily  . pregabalin  75 mg Oral BID  . sodium chloride  3 mL Intravenous Q12H   Continuous Infusions: . sodium chloride 75 mL/hr at 05/01/13 0730   PRN Meds:.acetaminophen, acetaminophen, albuterol, ondansetron (ZOFRAN) IV, ondansetron, oxyCODONE-acetaminophen Assessment/Plan:  Plan:  Acute on Chronic Nausea and Vomiting- Pt with history of chronic nausea and vomiting of unknown etiology with extensive past work-up. Last gastric emptying study in 2010 was normal, however now with worsening DM and in setting of chronic marijuana use may be due to gastroparesis vs pain seeking behavior.      -Unable to obtain gastric emptying study today--> outpatient GI follow-up with Dr. Benson Norway --> 1/19 @ 9 AM -Continue home tylenol 650 mg PRN, 1-2 percocet/roxicet 5-325 mg PRN pain   -Continue PO zofran 4 mg PRN nausea   -Continue PO protonix 40 mg daily for acid reflux (pt on omeprazole 40 mg daily)  -Obtain urine  pregnancy test, H. Pylori stool Ag   LLQ abdominal pain - currently stable without peritoneal signs.  Pt with work-up in past with no etiology identified. -Outpatient follow-up  Insulin Dependent Type II DM - HbA1c 12.7 on 1/6. Pt reports taking 40 U Lantus daily and ISS at home  -Lantus 10 U daily, increase as needed if uncontrolled  -Insulin sliding scale with meals  -Monitor glucose frequently   Hypertension - currently normotensive  -Continue amlodipine 10 mg daily   Thrombocytosis -  Most likely reactive due to microcytic anemia  -Continue to monitor CBC  Asthma - currently without exacerbation. Pt on advair daily and albuterol rescue inhaler at home -Continue dulera daily   Microcytic Anemia - currently stable with no active bleeding. Baseline Hg 10-11 -Continue to monitor for bleeding   Fibromyalgia - Pt on amitriptyline, gabapentin, and pregabalin at home  -Continue home gabapentin 300 mg TID -Continue home amitriptyline 50 mg daily  -Continue home pregabalin 75 mg daily    Leukocytosis - resolved.  Pt with chronic leukocytosis of unknown etiology. No infectious source identified, possibly inflammatory in nature vs bone marrow disease considering anemia and thrombocytosis.  -Continue to monitor CBC -Continue to monitor for infection  Diet: Carb modified  DVT Ppx: Lovenox Code: Full   Dispo: Disposition is deferred at this time, awaiting improvement of current medical problems.  Anticipated discharge in approximately 1 day(s).   The patient does have a current PCP Mayra Reel Beverly Sessions, MD) and does need an [facility] hospital follow-up appointment after discharge.  The patient does not have transportation limitations that hinder transportation to clinic appointments.  .Services Needed at time of discharge: Y = Yes, Blank = No PT:   OT:   RN:   Equipment:   Other:     LOS: 2 days   Juluis Mire, MD 05/01/2013, 9:36 AM

## 2013-05-01 NOTE — Progress Notes (Signed)
[facility] called this morning to have patient come to lab for gastric emptying procedure.  Patient stated that she is allergic to eggs and oatmeal.  The two only options for the test. Malachy Mood from [facility] ext. [id number] stated that there are no other options for the test.  MD, please dc order.

## 2013-05-01 NOTE — Progress Notes (Signed)
  Date: 05/01/2013  Patient name: Shirley [name]  Medical record number: [id number]  Date of birth: 14-Oct-1975   This patient has been seen and the plan of care was discussed with the house staff. Please see their note for complete details. I concur with their findings with the following additions/corrections:  Shirley [name] reports an intolerance to oatmeal and egg as they make her nauseated.  She did report she would try the GES with oatmeal if flavoring could be added.  We will attempt this today.  If she cannot tolerate, would plan to discharge tomorrow with good pain/nausea medications at home and have GI follow up as an outpatient.   Sid Falcon, MD 05/01/2013, 4:09 PM

## 2013-05-02 ENCOUNTER — Telehealth: Payer: Self-pay | Admitting: *Deleted

## 2013-05-02 NOTE — Telephone Encounter (Signed)
Call to patient.   Patient mailed copy of Sliding Scale for Insulin.  Sander Nephew, RN 05/02/2013 4:46 PM

## 2013-05-05 NOTE — Discharge Summary (Signed)
Name: Shirley [name] MRN: [id number] DOB: [birth year] 38 y.o. PCP: Jeralene Huff, MD  Date of Admission: 04/29/2013  1:54 AM Date of Discharge: 05/01/2013 Attending Physician: Peri Jefferson. Daryll Drown, MD  Discharge Diagnosis:  Principal Problem:   SIRS (systemic inflammatory response syndrome) Active Problems:   HYPERTENSION   Diabetes mellitus type 2, uncontrolled, with complications   Intractable vomiting   Dehydration   Weakness  Discharge Medications:   Medication List         albuterol 108 (90 BASE) MCG/ACT inhaler  Commonly known as:  PROVENTIL HFA;VENTOLIN HFA  Inhale 2 puffs into the lungs every 4 (four) hours as needed for wheezing or shortness of breath.     amitriptyline 25 MG tablet  Commonly known as:  ELAVIL  Take 2 tablets (50 mg total) by mouth at bedtime.     amLODipine 10 MG tablet  Commonly known as:  NORVASC  Take 1 tablet (10 mg total) by mouth daily.     calcium carbonate 500 MG chewable tablet  Commonly known as:  TUMS - dosed in mg elemental calcium  Chew 1 tablet by mouth daily.     Fluticasone-Salmeterol 100-50 MCG/DOSE Aepb  Commonly known as:  ADVAIR  Inhale 1 puff into the lungs every 12 (twelve) hours.     gabapentin 300 MG capsule  Commonly known as:  NEURONTIN  Take 300 mg by mouth 3 (three) times daily.     insulin aspart 100 UNIT/ML injection  Commonly known as:  novoLOG  Inject 2-12 Units into the skin 3 (three) times daily as needed for high blood sugar. Sliding scale- If sugar 150-199: inject 2 unit;  200-249: 4 units, 250-299: 7 units; 300-349: 10 units; Over 350: 12 units     insulin glargine 100 UNIT/ML injection  Commonly known as:  LANTUS  Inject 0.4 mLs (40 Units total) into the skin at bedtime.     omeprazole 40 MG capsule  Commonly known as:  PRILOSEC  Take 1 capsule (40 mg total) by mouth daily.     oxyCODONE-acetaminophen 5-325 MG per tablet  Commonly known as:  PERCOCET/ROXICET  Take 1-2 tablets by mouth  every 6 (six) hours as needed for moderate pain or severe pain.     oxyCODONE-acetaminophen 5-325 MG per tablet  Commonly known as:  PERCOCET/ROXICET  Take 1-2 tablets by mouth 2 (two) times daily.     pregabalin 75 MG capsule  Commonly known as:  LYRICA  Take 1 capsule (75 mg total) by mouth 2 (two) times daily.     promethazine 25 MG tablet  Commonly known as:  PHENERGAN  Take 1 tablet (25 mg total) by mouth every 6 (six) hours as needed for nausea or vomiting.        Disposition and follow-up:   Shirley [name] was discharged from [facility] in Good condition.  At the hospital follow up visit please address:  1.  GI follow-up for colonoscopy, EGD with small bowel biopsy (Celiac disease), gastric emptying study,  H.      Pylori biopsy testing      OBGYN follow-up      Diabetes Control       Consider oral iron therapy       Resolution of rash near hairline with hydrocortisone cream   2.  Labs / imaging needed at time of follow-up: BMP, CBC, anemia panel  3.  Pending labs/ test needing follow-up: none  Follow-up Appointments:  Follow-up Information   Follow up with Clinton Gallant, MD On 05/08/2013. (2:45pm)    Specialty:  Internal Medicine   Contact information:   [address] [place] [address] [phone]       Follow up with Beryle Beams, MD On 05/12/2013. (9:00 AM, needto bring $9 for past debt, if cannot make appt, please cancel )    Specialty:  Gastroenterology   Contact information:   [address] [place] [address] [phone]       Discharge Instructions: Discharge Orders   Future Appointments Provider Department Dept Phone   05/08/2013 2:45 PM Clinton Gallant, MD [place] [phone]   Future Orders Complete By Expires   Increase activity slowly  As directed       Consultations:    Procedures Performed:  Ct Abdomen Pelvis W Contrast  04/11/2013   CLINICAL DATA:   Vomiting  EXAM: CT ABDOMEN AND PELVIS WITH CONTRAST  TECHNIQUE: Multidetector CT imaging of the abdomen and pelvis was performed using the standard protocol following bolus administration of intravenous contrast.  CONTRAST:  79mL OMNIPAQUE IOHEXOL 300 MG/ML SOLN, 134mL OMNIPAQUE IOHEXOL 300 MG/ML SOLN  COMPARISON:  08/15/2012  FINDINGS: Visualized lung bases clear. Unremarkable liver, gallbladder, spleen, adrenal glands, kidneys, pancreas, abdominal aorta, portal vein. Stomach is nondistended. Small bowel and colon are nondilated. Normal appendix. Degenerative disc disease L5-S1. Urinary bladder is incompletely distended and mildly thick-walled. Uterus and adnexal regions unremarkable. No ascites. No free air. No adenopathy localized.  IMPRESSION: 1. No acute abdominal process.   Electronically Signed   By: Arne Cleveland M.D.   On: 04/11/2013 18:48    2D Echo: none  Cardiac Cath: none  Admission HPI: Original Author Michail Jewels, MD  Pt is a 38 y.o. female with a PMH of chronic N&V thought to be secondary to gastroparesis (normal gastric emptying study 2011), chronic adominal pain, HTN, dyslipidemia, uncontrolled DM2, chronic back pain, OSA, CAD, (06/15/2006), pyelonephritis, asthma, anxiety/depression, hepatitis B, fibromyalgia, GERD, and CVA who presents to the [facility] ED with nausea/vomiting/abdominal pain and was transferred to [facility] [place] for further evaluation. She was discharged on 04/15/13 for similar complaints. She began feeling nauseated and began vomiting (NBNB) 2-3 days ago. Vomiting is unrelated to food. She reports decreased po intake for the past 3 days. She reports symptoms have been going on since being diagnosed with H pylori two years ago. She denies any fever/chills, CP, SOB, or diarrhea. She reports palpitations. She takes phenergan and percocet at home which typically help. She reports zofran relieves her nausea.   On previous admission, workup was non-revealing to the etiology of her N/V and  abdominal pain. Pt has been to the ED 4 times in the past 6 months for similar complaints.    Hospital Course by problem list:   Acute on Chronic Nausea and Vomiting- Pt presented with history of chronic nausea and vomiting of unknown etiology with extensive past work-up. CT abdomen on 04/11/13 revealed no acute abdominal process. Pt diagnosed with H.pylori about 2 years ago and completed 2 cycles of treatment. H pylori stool antigen was unable to be obtained because pt could not provide specimen. Last gastric emptying study in 2010 was normal, however now with worsening DM concerning for gastroparesis. Pt scheduled to have gastric emptying study during hospitalization however due to allergy to egg and oatmeal allergy could not tolerate procedure and in setting of chronic opioid use may not yield accurate results. Work-up (lipase,UA, UDS, TSH) was  negative. Pt with elevated lactic acid (also elevated 1 month ago).  Pt received pain medications during hospitalization for adequate control of pain. Pt was able to tolerate advancing diet with no reported episodes of emesis during hospitalization. Pt was continued on anti-emetic and acid-reflux therapy which pt is instructed to continue on discharge. Pt scheduled for GI follow-up with Dr. Benson Norway on 05/12/13.  Pt was given 10-day course of percocet/roxicet 5-325 mg until [facility] hospital follow-up appointment. P t was also given excusal form for missing traffic court appearance on 04/29/13.  Left lower quadrant abdominal pain - Pt with chronic LLQ  abdominal pain without peritoneal signs that remained stable during hospitalization.  Pt with work-up in the past with no etiology identified. Pt to have outpatient GI follow-up for colonoscopy and OBGYN follow-up.  Insulin Dependent Type II DM -  Pt with HbA1c of 12.7 on 1/6. Pt reported taking 40 U Lantus daily and ISS at home. Pt was continued on lantus 10 U daily due to poor PO intake and insulin sliding scale with  meals. Glucose was frequently monitored with glucose range 98-335.   Hypertension -  Pt with asymptomatic blood pressure range between 109/57 to 190/114 during hospitalization. Pt was continued on home amlodipine daily.    Leukocytosis - Pt with chronic leukocytosis of unknown etiology. No infectious source identified, possibly inflammatory in nature vs bone marrow disease considering anemia and thrombocytosis. Pt with normalization of leukocytosis during hospitalization.   Thrombocytosis - Pt with chronic elevated platelet count of unknown etiology, possibly reactive due to microcytic anemia. Myeloproliferative disease also possible considering chronic leukocytosis.    Asthma - Pt without acute exacerbation during hospitalization requiring corticosteroid or antibiotic use. Pt was continued on dulera daily and albuterol rescue inhaler as needed.    Microcytic Anemia - Pt with stable Hg near baseline of 10-11 during hospitalization with no reported bleeding.Pt with last anemia panel 2 years ago consistent with iron-deficiency anemia not on oral iron replacement therapy. Pt to have outpatient anemia panel to assess if oral iron therapy warranted.     Fibromyalgia - Pt without acute flare during hospitalization and continued on home amitriptyline, gabapentin, and pregabalin.     Rash - Pt with rash near scalp with dryness and pruritis. Pt to apply vaseline gel and hydrocortisone to area for relief.     DVT Prophylaxis - Pt continued on LMWH during hospitalization without evidence of  DVT or HIT syndrome.      Discharge Vitals:   BP 117/81  Pulse 98  Temp(Src) 97.5 F (36.4 C) (Oral)  Resp 16  Ht 5\' 7"  (1.702 m)  Wt 97.206 kg (214 lb 4.8 oz)  BMI 33.56 kg/m2  SpO2 100%  LMP 04/29/2013  Discharge Labs:  No results found for this or any previous visit (from the past 24 hour(s)).  Signed: Juluis Mire, MD 05/05/2013, 6:06 AM   Time Spent on Discharge: 60 minutes Services Ordered on  Discharge: none Equipment Ordered on Discharge: none

## 2013-05-08 ENCOUNTER — Encounter (HOSPITAL_COMMUNITY): Payer: Self-pay | Admitting: Emergency Medicine

## 2013-05-08 ENCOUNTER — Emergency Department (HOSPITAL_COMMUNITY)
Admission: EM | Admit: 2013-05-08 | Discharge: 2013-05-08 | Disposition: A | Discharge status: Home / Self Care | Payer: Medicaid Other | Attending: Emergency Medicine | Admitting: Emergency Medicine

## 2013-05-08 ENCOUNTER — Ambulatory Visit: Payer: Medicaid Other | Admitting: Internal Medicine

## 2013-05-08 DIAGNOSIS — R112 Nausea with vomiting, unspecified: Secondary | ICD-10-CM | POA: Insufficient documentation

## 2013-05-08 DIAGNOSIS — I2510 Atherosclerotic heart disease of native coronary artery without angina pectoris: Secondary | ICD-10-CM | POA: Insufficient documentation

## 2013-05-08 DIAGNOSIS — F3289 Other specified depressive episodes: Secondary | ICD-10-CM | POA: Insufficient documentation

## 2013-05-08 DIAGNOSIS — IMO0002 Reserved for concepts with insufficient information to code with codable children: Secondary | ICD-10-CM | POA: Insufficient documentation

## 2013-05-08 DIAGNOSIS — E1149 Type 2 diabetes mellitus with other diabetic neurological complication: Secondary | ICD-10-CM | POA: Insufficient documentation

## 2013-05-08 DIAGNOSIS — K3184 Gastroparesis: Secondary | ICD-10-CM | POA: Insufficient documentation

## 2013-05-08 DIAGNOSIS — Z8673 Personal history of transient ischemic attack (TIA), and cerebral infarction without residual deficits: Secondary | ICD-10-CM | POA: Insufficient documentation

## 2013-05-08 DIAGNOSIS — F411 Generalized anxiety disorder: Secondary | ICD-10-CM | POA: Insufficient documentation

## 2013-05-08 DIAGNOSIS — Z794 Long term (current) use of insulin: Secondary | ICD-10-CM | POA: Insufficient documentation

## 2013-05-08 DIAGNOSIS — A5900 Urogenital trichomoniasis, unspecified: Secondary | ICD-10-CM | POA: Insufficient documentation

## 2013-05-08 DIAGNOSIS — G609 Hereditary and idiopathic neuropathy, unspecified: Secondary | ICD-10-CM | POA: Insufficient documentation

## 2013-05-08 DIAGNOSIS — Z79899 Other long term (current) drug therapy: Secondary | ICD-10-CM | POA: Insufficient documentation

## 2013-05-08 DIAGNOSIS — Z3202 Encounter for pregnancy test, result negative: Secondary | ICD-10-CM | POA: Insufficient documentation

## 2013-05-08 DIAGNOSIS — I10 Essential (primary) hypertension: Secondary | ICD-10-CM | POA: Insufficient documentation

## 2013-05-08 DIAGNOSIS — E785 Hyperlipidemia, unspecified: Secondary | ICD-10-CM | POA: Insufficient documentation

## 2013-05-08 DIAGNOSIS — K219 Gastro-esophageal reflux disease without esophagitis: Secondary | ICD-10-CM | POA: Insufficient documentation

## 2013-05-08 DIAGNOSIS — R109 Unspecified abdominal pain: Secondary | ICD-10-CM | POA: Insufficient documentation

## 2013-05-08 DIAGNOSIS — G4733 Obstructive sleep apnea (adult) (pediatric): Secondary | ICD-10-CM | POA: Insufficient documentation

## 2013-05-08 DIAGNOSIS — A599 Trichomoniasis, unspecified: Secondary | ICD-10-CM

## 2013-05-08 DIAGNOSIS — J45909 Unspecified asthma, uncomplicated: Secondary | ICD-10-CM | POA: Insufficient documentation

## 2013-05-08 DIAGNOSIS — Z87891 Personal history of nicotine dependence: Secondary | ICD-10-CM | POA: Insufficient documentation

## 2013-05-08 DIAGNOSIS — B181 Chronic viral hepatitis B without delta-agent: Secondary | ICD-10-CM | POA: Insufficient documentation

## 2013-05-08 DIAGNOSIS — F329 Major depressive disorder, single episode, unspecified: Secondary | ICD-10-CM | POA: Insufficient documentation

## 2013-05-08 LAB — POCT I-STAT, CHEM 8
BUN: 26 mg/dL — AB (ref 6–23)
CHLORIDE: 101 meq/L (ref 96–112)
CREATININE: 1 mg/dL (ref 0.50–1.10)
Calcium, Ion: 1.17 mmol/L (ref 1.12–1.23)
GLUCOSE: 198 mg/dL — AB (ref 70–99)
HEMATOCRIT: 35 % — AB (ref 36.0–46.0)
Hemoglobin: 11.9 g/dL — ABNORMAL LOW (ref 12.0–15.0)
POTASSIUM: 3.9 meq/L (ref 3.7–5.3)
Sodium: 137 mEq/L (ref 137–147)
TCO2: 27 mmol/L (ref 0–100)

## 2013-05-08 LAB — CBC WITH DIFFERENTIAL/PLATELET
Basophils Absolute: 0 10*3/uL (ref 0.0–0.1)
Basophils Relative: 0 % (ref 0–1)
Eosinophils Absolute: 0.1 10*3/uL (ref 0.0–0.7)
Eosinophils Relative: 1 % (ref 0–5)
HCT: 31.1 % — ABNORMAL LOW (ref 36.0–46.0)
HEMOGLOBIN: 10.2 g/dL — AB (ref 12.0–15.0)
Lymphocytes Relative: 28 % (ref 12–46)
Lymphs Abs: 4.1 10*3/uL — ABNORMAL HIGH (ref 0.7–4.0)
MCH: 21.1 pg — ABNORMAL LOW (ref 26.0–34.0)
MCHC: 32.8 g/dL (ref 30.0–36.0)
MCV: 64.3 fL — ABNORMAL LOW (ref 78.0–100.0)
MONO ABS: 1.2 10*3/uL — AB (ref 0.1–1.0)
Monocytes Relative: 8 % (ref 3–12)
NEUTROS PCT: 63 % (ref 43–77)
Neutro Abs: 9.2 10*3/uL — ABNORMAL HIGH (ref 1.7–7.7)
Platelets: 516 10*3/uL — ABNORMAL HIGH (ref 150–400)
RBC: 4.84 MIL/uL (ref 3.87–5.11)
RDW: 18.3 % — ABNORMAL HIGH (ref 11.5–15.5)
WBC: 14.6 10*3/uL — ABNORMAL HIGH (ref 4.0–10.5)

## 2013-05-08 LAB — URINALYSIS, ROUTINE W REFLEX MICROSCOPIC
Glucose, UA: >1000 mg/dL — AB
HGB URINE DIPSTICK: NEGATIVE
Ketones, ur: 15 mg/dL — AB
Leukocytes, UA: NEGATIVE
Nitrite: NEGATIVE
PH: 5.5 (ref 5.0–8.0)
Protein, ur: 30 mg/dL — AB
Specific Gravity, Urine: 1.034 — ABNORMAL HIGH (ref 1.005–1.030)
Urobilinogen, UA: 1 mg/dL (ref 0.0–1.0)

## 2013-05-08 LAB — URINE MICROSCOPIC-ADD ON

## 2013-05-08 LAB — GLUCOSE, CAPILLARY: Glucose-Capillary: 206 mg/dL — ABNORMAL HIGH (ref 70–99)

## 2013-05-08 LAB — POCT PREGNANCY, URINE: Preg Test, Ur: NEGATIVE

## 2013-05-08 MED ORDER — KETOROLAC TROMETHAMINE 30 MG/ML IJ SOLN
30.0000 mg | Freq: Once | INTRAMUSCULAR | Status: AC
  Administered 2013-05-08: 30 mg via INTRAVENOUS
  Filled 2013-05-08: qty 1

## 2013-05-08 MED ORDER — ONDANSETRON HCL 4 MG/2ML IJ SOLN
4.0000 mg | Freq: Once | INTRAMUSCULAR | Status: AC
  Administered 2013-05-08: 4 mg via INTRAVENOUS
  Filled 2013-05-08: qty 2

## 2013-05-08 MED ORDER — METRONIDAZOLE IN NACL 5-0.79 MG/ML-% IV SOLN
500.0000 mg | Freq: Once | INTRAVENOUS | Status: AC
  Administered 2013-05-08: 500 mg via INTRAVENOUS
  Filled 2013-05-08: qty 100

## 2013-05-08 MED ORDER — SODIUM CHLORIDE 0.9 % IV BOLUS (SEPSIS)
1000.0000 mL | Freq: Once | INTRAVENOUS | Status: AC
  Administered 2013-05-08: 1000 mL via INTRAVENOUS

## 2013-05-08 MED ORDER — ONDANSETRON 4 MG PO TBDP: ORAL_TABLET | ORAL | Status: DC

## 2013-05-08 MED ORDER — HYDROCODONE-ACETAMINOPHEN 5-325 MG PO TABS: 1.0000 | ORAL_TABLET | ORAL | Status: DC | PRN

## 2013-05-08 MED ORDER — METRONIDAZOLE 500 MG PO TABS: 500.0000 mg | ORAL_TABLET | Freq: Two times a day (BID) | ORAL | Status: DC

## 2013-05-08 MED ORDER — PANTOPRAZOLE SODIUM 40 MG IV SOLR
40.0000 mg | Freq: Once | INTRAVENOUS | Status: AC
  Administered 2013-05-08: 40 mg via INTRAVENOUS
  Filled 2013-05-08: qty 40

## 2013-05-08 MED ORDER — METOCLOPRAMIDE HCL 5 MG/ML IJ SOLN
10.0000 mg | Freq: Once | INTRAMUSCULAR | Status: AC
  Administered 2013-05-08: 10 mg via INTRAVENOUS
  Filled 2013-05-08: qty 2

## 2013-05-08 MED ORDER — FENTANYL CITRATE 0.05 MG/ML IJ SOLN
50.0000 ug | Freq: Once | INTRAMUSCULAR | Status: AC
  Administered 2013-05-08: 50 ug via INTRAVENOUS
  Filled 2013-05-08: qty 2

## 2013-05-08 NOTE — ED Provider Notes (Signed)
CSN: [id number]     Arrival date & time 05/08/13  0133 History   First MD Initiated Contact with Patient 05/08/13 0236     Chief Complaint  Patient presents with  . Emesis   HPI  History provided by patient and previous medical charts. Patient is a 38 year old female with histories of hypertension, hyperlipidemia, diabetes, chronic abdominal pain, gastroparesis who presents with complaints of nausea and vomiting. Patient reports having nausea and vomiting that began 2 days ago. She attempted to use Phenergan at home but did not have any improvement of symptoms. She states she has vomiting up to 10 times. She has no new acute to any food or fluids. Denies any associated diarrhea or constipation. Denies any fever, chills or sweats. She complains of some abdominal pain and discomfort, and left lower area. Pain is worse with vomiting. No other aggravating or alleviating factors. No other associated symptoms.   Past Medical History  Diagnosis Date  . Thrombocytosis     Hem/Onc suggested 2/2 chronic hepatits and/or iron deficiency anemia  . Iron deficiency anemia   . N&V (nausea and vomiting)     Chronic. Unclear etiology with multiple admission and ED visits. CT abdomen with and without contrast (02/2011)  showed no acute process. Gastic Emptying scan (01/2010) was normal. Ultrasound of the abdomen was within normal limits. Hepatitis B viral load was undectable. HIV NR. EGD - gastritis, Hpylori + s/p Rx  . Abdominal pain   . Hypertension   . Hyperlipidemia   . Diabetes mellitus type 2, uncontrolled, with complications   . Recurrent boils   . Back pain   . OSA (obstructive sleep apnea)   . 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 Cath 12/10- no restenosis or significant CAD progression  . Irregular menses     Small ovarian follicles seen on JG(8/11)  . History of pyelonephritis      H/o GrpB Pyelonephritis (9/06) and UTI- 07/11- E.Coli, 12/10- GBS  . Abscess of tunica vaginalis     10/09- Abundant S. aureus- sensitive to all abx  . Obesity   . Asthma   . Gastritis   . Depression   . Hepatitis B, chronic     Hep BeAb+,Hep B cAb+ & Hep BsAg+ (9/06)  . Peripheral neuropathy   . Fibromyalgia   . GERD (gastroesophageal reflux disease)   . Migraine   . Anxiety   . Gastroparesis     secondary to poorly controlled DM, last emptying study performed 01/2010  was normal but may be falsely positive as pt was on reglan  . Blood dyscrasia   . H/O: CVA (cerebrovascular accident)     history of remote right cerebellar infarct noted on head CT at least since 10/2011   Past Surgical History  Procedure Laterality Date  . Cesarean section  1997   Family History  Problem Relation Age of Onset  . Diabetes Father    History  Substance Use Topics  . Smoking status: Former Smoker    Types: Cigarettes    Quit date: 04/24/1996  . Smokeless tobacco: Never Used     Comment: quit smoking cigarettes age 64  . Alcohol Use: Yes     Comment: occasionally   OB History   Grav Para Term Preterm Abortions TAB SAB Ect Mult Living  Review of Systems  Constitutional: Negative for fever, chills and diaphoresis.  Respiratory: Negative for cough and shortness of breath.   Gastrointestinal: Positive for nausea, vomiting and abdominal pain. Negative for diarrhea and constipation.  Genitourinary: Negative for dysuria, frequency, hematuria, flank pain, vaginal bleeding and vaginal discharge.  All other systems reviewed and are negative.    Allergies  Lisinopril and Morphine and related  Home Medications   Current Outpatient Rx  Name  Route  Sig  Dispense  Refill  . albuterol (PROVENTIL HFA;VENTOLIN HFA) 108 (90 BASE) MCG/ACT inhaler   Inhalation   Inhale 2 puffs into the lungs every 4 (four) hours as needed for wheezing or shortness of breath.         Marland Kitchen  amitriptyline (ELAVIL) 25 MG tablet   Oral   Take 2 tablets (50 mg total) by mouth at bedtime.   60 tablet   3   . amLODipine (NORVASC) 10 MG tablet   Oral   Take 1 tablet (10 mg total) by mouth daily.   30 tablet   6   . calcium carbonate (TUMS - DOSED IN MG ELEMENTAL CALCIUM) 500 MG chewable tablet   Oral   Chew 1 tablet by mouth daily.         . Fluticasone-Salmeterol (ADVAIR) 100-50 MCG/DOSE AEPB   Inhalation   Inhale 1 puff into the lungs every 12 (twelve) hours.   60 each   6   . gabapentin (NEURONTIN) 300 MG capsule   Oral   Take 300 mg by mouth 3 (three) times daily.         . insulin aspart (NOVOLOG) 100 UNIT/ML injection   Subcutaneous   Inject 2-12 Units into the skin 3 (three) times daily as needed for high blood sugar. Sliding scale- If sugar 150-199: inject 2 unit;  200-249: 4 units, 250-299: 7 units; 300-349: 10 units; Over 350: 12 units         . insulin glargine (LANTUS) 100 UNIT/ML injection   Subcutaneous   Inject 0.4 mLs (40 Units total) into the skin at bedtime.   10 mL   6   . omeprazole (PRILOSEC) 40 MG capsule   Oral   Take 1 capsule (40 mg total) by mouth daily.   31 capsule   6   . oxyCODONE-acetaminophen (PERCOCET/ROXICET) 5-325 MG per tablet   Oral   Take 1-2 tablets by mouth every 6 (six) hours as needed for moderate pain or severe pain.   24 tablet   0   . pregabalin (LYRICA) 75 MG capsule   Oral   Take 1 capsule (75 mg total) by mouth 2 (two) times daily.   60 capsule   6   . promethazine (PHENERGAN) 25 MG tablet   Oral   Take 1 tablet (25 mg total) by mouth every 6 (six) hours as needed for nausea or vomiting.   30 tablet   0    BP 119/84  Pulse 101  Temp(Src) 98.8 F (37.1 C) (Oral)  Resp 15  Ht 5\' 7"  (1.702 m)  Wt 192 lb (87.091 kg)  BMI 30.06 kg/m2  SpO2 97%  LMP 04/29/2013 Physical Exam  Nursing note and vitals reviewed. Constitutional: She is oriented to person, place, and time. She appears  well-developed and well-nourished. No distress.  HENT:  Head: Normocephalic.  Cardiovascular: Normal rate and regular rhythm.   Pulmonary/Chest: Effort normal and breath sounds normal. No respiratory distress. She has no wheezes. She has no rales.  Abdominal: Soft. There is tenderness in the suprapubic area and left lower quadrant.  Neurological: She is alert and oriented to person, place, and time.  Skin: Skin is warm and dry. No rash noted.  Psychiatric: She has a normal mood and affect. Her behavior is normal.    ED Course  Procedures   DIAGNOSTIC STUDIES: Oxygen Saturation is 97% on room air.    COORDINATION OF CARE:  Nursing notes reviewed. Vital signs reviewed. Initial pt interview and examination performed.   5:21 AM-patient seen and evaluated. Patient appears well no acute distress. Does not appear severely ill or toxic. Discussed work up plan with pt at bedside, which includes testing and UA. Pt agrees with plan.  Pt having some improvement after initial treatments.  Pt does have trichomonas on UA.    Pt discussed in signout with Michele Mcalpine PA-C.  She will follow pt condition.  Treatment plan initiated: Medications  sodium chloride 0.9 % bolus 1,000 mL (0 mLs Intravenous Stopped 05/08/13 0517)  ondansetron (ZOFRAN) injection 4 mg (4 mg Intravenous Given 05/08/13 0348)    Results for orders placed during the hospital encounter of 05/08/13  GLUCOSE, CAPILLARY      Result Value Range   Glucose-Capillary 206 (*) 70 - 99 mg/dL  URINALYSIS, ROUTINE W REFLEX MICROSCOPIC      Result Value Range   Color, Urine AMBER (*) YELLOW   APPearance CLEAR  CLEAR   Specific Gravity, Urine 1.034 (*) 1.005 - 1.030   pH 5.5  5.0 - 8.0   Glucose, UA >1000 (*) NEGATIVE mg/dL   Hgb urine dipstick NEGATIVE  NEGATIVE   Bilirubin Urine SMALL (*) NEGATIVE   Ketones, ur 15 (*) NEGATIVE mg/dL   Protein, ur 30 (*) NEGATIVE mg/dL   Urobilinogen, UA 1.0  0.0 - 1.0 mg/dL   Nitrite NEGATIVE   NEGATIVE   Leukocytes, UA NEGATIVE  NEGATIVE  URINE MICROSCOPIC-ADD ON      Result Value Range   Squamous Epithelial / LPF MANY (*) RARE   WBC, UA 3-6  <3 WBC/hpf   Bacteria, UA FEW (*) RARE   Casts HYALINE CASTS (*) NEGATIVE   Urine-Other MUCOUS PRESENT    POCT I-STAT, CHEM 8      Result Value Range   Sodium 137  137 - 147 mEq/L   Potassium 3.9  3.7 - 5.3 mEq/L   Chloride 101  96 - 112 mEq/L   BUN 26 (*) 6 - 23 mg/dL   Creatinine, Ser 1.00  0.50 - 1.10 mg/dL   Glucose, Bld 198 (*) 70 - 99 mg/dL   Calcium, Ion 1.17  1.12 - 1.23 mmol/L   TCO2 27  0 - 100 mmol/L   Hemoglobin 11.9 (*) 12.0 - 15.0 g/dL   HCT 35.0 (*) 36.0 - 46.0 %  POCT PREGNANCY, URINE      Result Value Range   Preg Test, Ur NEGATIVE  NEGATIVE      Imaging Review No results found.  EKG Interpretation   None       MDM   1. Nausea & vomiting   2. Trichomoniasis        Martie Lee, PA-C 05/08/13 [phone]

## 2013-05-08 NOTE — ED Notes (Signed)
Pt given diet Ginger Ale for PO trial

## 2013-05-08 NOTE — ED Provider Notes (Signed)
Medical screening examination/treatment/procedure(s) were performed by non-physician practitioner and as supervising physician I was immediately available for consultation/collaboration.    Kalman Drape, MD 05/08/13 [phone]

## 2013-05-08 NOTE — ED Notes (Signed)
Notified RN, Autumn, pt. CBG 206.

## 2013-05-08 NOTE — Discharge Instructions (Signed)
Use zofran as needed for nausea. Take Vicodin for severe pain only. No driving or operating heavy machinery while taking vicodin. This medication may cause drowsiness. Take antibiotic to completion for trichomoniasis.  Nausea and Vomiting Nausea is a sick feeling that often comes before throwing up (vomiting). Vomiting is a reflex where stomach contents come out of your mouth. Vomiting can cause severe loss of body fluids (dehydration). Children and elderly adults can become dehydrated quickly, especially if they also have diarrhea. Nausea and vomiting are symptoms of a condition or disease. It is important to find the cause of your symptoms. CAUSES   Direct irritation of the stomach lining. This irritation can result from increased acid production (gastroesophageal reflux disease), infection, food poisoning, taking certain medicines (such as nonsteroidal anti-inflammatory drugs), alcohol use, or tobacco use.  Signals from the brain.These signals could be caused by a headache, heat exposure, an inner ear disturbance, increased pressure in the brain from injury, infection, a tumor, or a concussion, pain, emotional stimulus, or metabolic problems.  An obstruction in the gastrointestinal tract (bowel obstruction).  Illnesses such as diabetes, hepatitis, gallbladder problems, appendicitis, kidney problems, cancer, sepsis, atypical symptoms of a heart attack, or eating disorders.  Medical treatments such as chemotherapy and radiation.  Receiving medicine that makes you sleep (general anesthetic) during surgery. DIAGNOSIS Your caregiver may ask for tests to be done if the problems do not improve after a few days. Tests may also be done if symptoms are severe or if the reason for the nausea and vomiting is not clear. Tests may include:  Urine tests.  Blood tests.  Stool tests.  Cultures (to look for evidence of infection).  X-rays or other imaging studies. Test results can help your caregiver  make decisions about treatment or the need for additional tests. TREATMENT You need to stay well hydrated. Drink frequently but in small amounts.You may wish to drink water, sports drinks, clear broth, or eat frozen ice pops or gelatin dessert to help stay hydrated.When you eat, eating slowly may help prevent nausea.There are also some antinausea medicines that may help prevent nausea. HOME CARE INSTRUCTIONS   Take all medicine as directed by your caregiver.  If you do not have an appetite, do not force yourself to eat. However, you must continue to drink fluids.  If you have an appetite, eat a normal diet unless your caregiver tells you differently.  Eat a variety of complex carbohydrates (rice, wheat, potatoes, bread), lean meats, yogurt, fruits, and vegetables.  Avoid high-fat foods because they are more difficult to digest.  Drink enough water and fluids to keep your urine clear or pale yellow.  If you are dehydrated, ask your caregiver for specific rehydration instructions. Signs of dehydration may include:  Severe thirst.  Dry lips and mouth.  Dizziness.  Dark urine.  Decreasing urine frequency and amount.  Confusion.  Rapid breathing or pulse. SEEK IMMEDIATE MEDICAL CARE IF:   You have blood or brown flecks (like coffee grounds) in your vomit.  You have black or bloody stools.  You have a severe headache or stiff neck.  You are confused.  You have severe abdominal pain.  You have chest pain or trouble breathing.  You do not urinate at least once every 8 hours.  You develop cold or clammy skin.  You continue to vomit for longer than 24 to 48 hours.  You have a fever. MAKE SURE YOU:   Understand these instructions.  Will watch your condition.  Will  get help right away if you are not doing well or get worse. Document Released: 04/10/2005 Document Revised: 07/03/2011 Document Reviewed: 09/07/2010 [facility] Patient Information 2014 [place],  [place].  Trichomoniasis Trichomoniasis is an infection, caused by the Trichomonas organism, that affects both women and men. In women, the outer female genitalia and the vagina are affected. In men, the penis is mainly affected, but the prostate and other reproductive organs can also be involved. Trichomoniasis is a sexually transmitted disease (STD) and is most often passed to another person through sexual contact. The majority of people who get trichomoniasis do so from a sexual encounter and are also at risk for other STDs. CAUSES   Sexual intercourse with an infected partner.  It can be present in swimming pools or hot tubs. SYMPTOMS   Abnormal gray-green frothy vaginal discharge in women.  Vaginal itching and irritation in women.  Itching and irritation of the area outside the vagina in women.  Penile discharge with or without pain in males.  Inflammation of the urethra (urethritis), causing painful urination.  Bleeding after sexual intercourse. RELATED COMPLICATIONS  Pelvic inflammatory disease.  Infection of the uterus (endometritis).  Infertility.  Tubal (ectopic) pregnancy.  It can be associated with other STDs, including gonorrhea and chlamydia, hepatitis B, and HIV. COMPLICATIONS DURING PREGNANCY  Early (premature) delivery.  Premature rupture of the membranes (PROM).  Low birth weight. DIAGNOSIS   Visualization of Trichomonas under the microscope from the vagina discharge.  Ph of the vagina greater than 4.5, tested with a test tape.  Trich Rapid Test.  Culture of the organism, but this is not usually needed.  It may be found on a Pap test.  Having a "strawberry cervix,"which means the cervix looks very red like a strawberry. TREATMENT   You may be given medication to fight the infection. Inform your caregiver if you could be or are pregnant. Some medications used to treat the infection should not be taken during pregnancy.  Over-the-counter  medications or creams to decrease itching or irritation may be recommended.  Your sexual partner will need to be treated if infected. HOME CARE INSTRUCTIONS   Take all medication prescribed by your caregiver.  Take over-the-counter medication for itching or irritation as directed by your caregiver.  Do not have sexual intercourse while you have the infection.  Do not douche or wear tampons.  Discuss your infection with your partner, as your partner may have acquired the infection from you. Or, your partner may have been the person who transmitted the infection to you.  Have your sex partner examined and treated if necessary.  Practice safe, informed, and protected sex.  See your caregiver for other STD testing. SEEK MEDICAL CARE IF:   You still have symptoms after you finish the medication.  You have an oral temperature above 102 F (38.9 C).  You develop belly (abdominal) pain.  You have pain when you urinate.  You have bleeding after sexual intercourse.  You develop a rash.  The medication makes you sick or makes you throw up (vomit). Document Released: 10/04/2000 Document Revised: 07/03/2011 Document Reviewed: 10/30/2008 [facility] Patient Information 2014 [place], [place].

## 2013-05-08 NOTE — ED Notes (Signed)
Pt states she started vomiting 2 days ago  Pt states she has used phenergan at home but is unable to hold that down  Pt states she is having pain in her lower left abdomen  Denies diarrhea

## 2013-05-08 NOTE — ED Provider Notes (Signed)
Care assumed from [place], PA-C at shift change. Pt presenting with n/v, hx of DM and gastroparesis. Cbc pending. UA positive for trich. Denies vaginal complaints. Pt receiving pain meds, dose of IV flagyl. Plan to control symptoms and discharge home. Has had multiple normal CTs in the past. 7:16 AM Pt reports her nausea is subsiding, has tolerated drinking ginger ale and keeping it down. States she still has lower abdominal pain, however would like to try to eat something to see if that helps. On exam, pt is resting comfortably on exam bed. Abdomen soft, tender lower abdomen, moreso on left, no rebound or guarding. No peritoneal signs. 7:52 AM Pt tolerated eating crackers, no further vomiting. Pt still with some pain, abdomen remains soft with no peritoneal signs. She is stable for discharge home with pain control, flagyl for trich. She has f/u with her GI Dr. Benson Norway. Return precautions given. Patient states understanding of treatment care plan and is agreeable.   Illene Labrador, PA-C 05/08/13 [phone]

## 2013-05-08 NOTE — ED Notes (Signed)
Pt c/o n/v for several days. Pt recently d/c for same. Pt states she does have Phenergan Supp at home but was too weak to use them. No active vomiting noted at this time.

## 2013-05-08 NOTE — ED Notes (Signed)
Pt asking for pain medications.  Notified PA.  Pt being d/c home.

## 2013-05-09 NOTE — Discharge Summary (Signed)
I saw Shirley [name] on day of discharge.  It is not clear that she has a true allergy to egg or oatmeal, it appears to be patient preference as she has tolerated oatmeal for GES before.  She does note that they increase her nausea and it takes her "too long" to eat them.  I assisted with the discharge planning.  She needs to follow up with GI and OB/GYN as previously arranged.

## 2013-05-09 NOTE — ED Provider Notes (Signed)
Medical screening examination/treatment/procedure(s) were performed by non-physician practitioner and as supervising physician I was immediately available for consultation/collaboration.    Kalman Drape, MD 05/09/13 [phone]

## 2013-05-10 LAB — URINE CULTURE: Colony Count: >=100000

## 2013-05-10 IMAGING — CR DG ABDOMEN ACUTE W/ 1V CHEST
4 series · 4 of 4 positions shown · non-contrast
Comparison: 09/08/2010

CLINICAL DATA: Abdominal pain.  Left lower abdominal pain with
vomiting off and on since [REDACTED].

ACUTE ABDOMEN SERIES (ABDOMEN 2 VIEW & CHEST 1 VIEW)

[w chest pa]
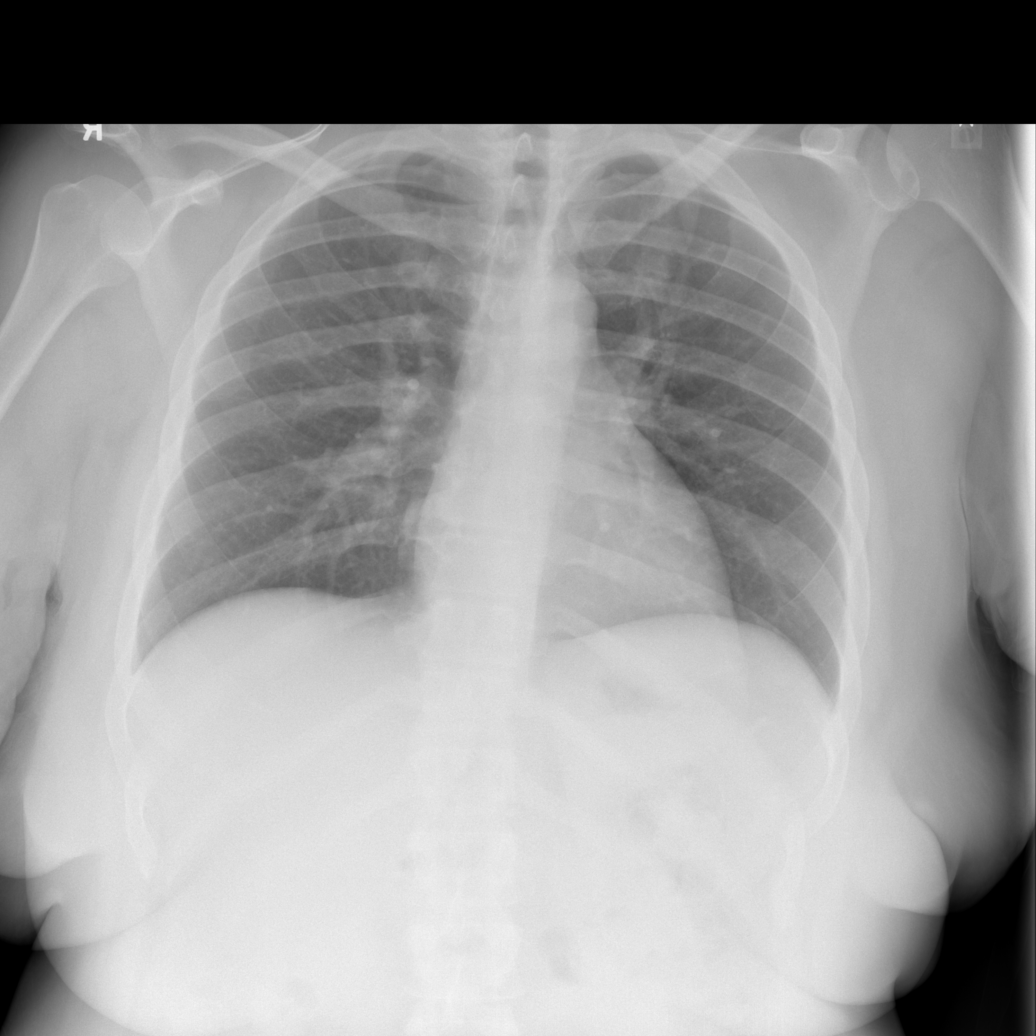

[w abdomen upright *]
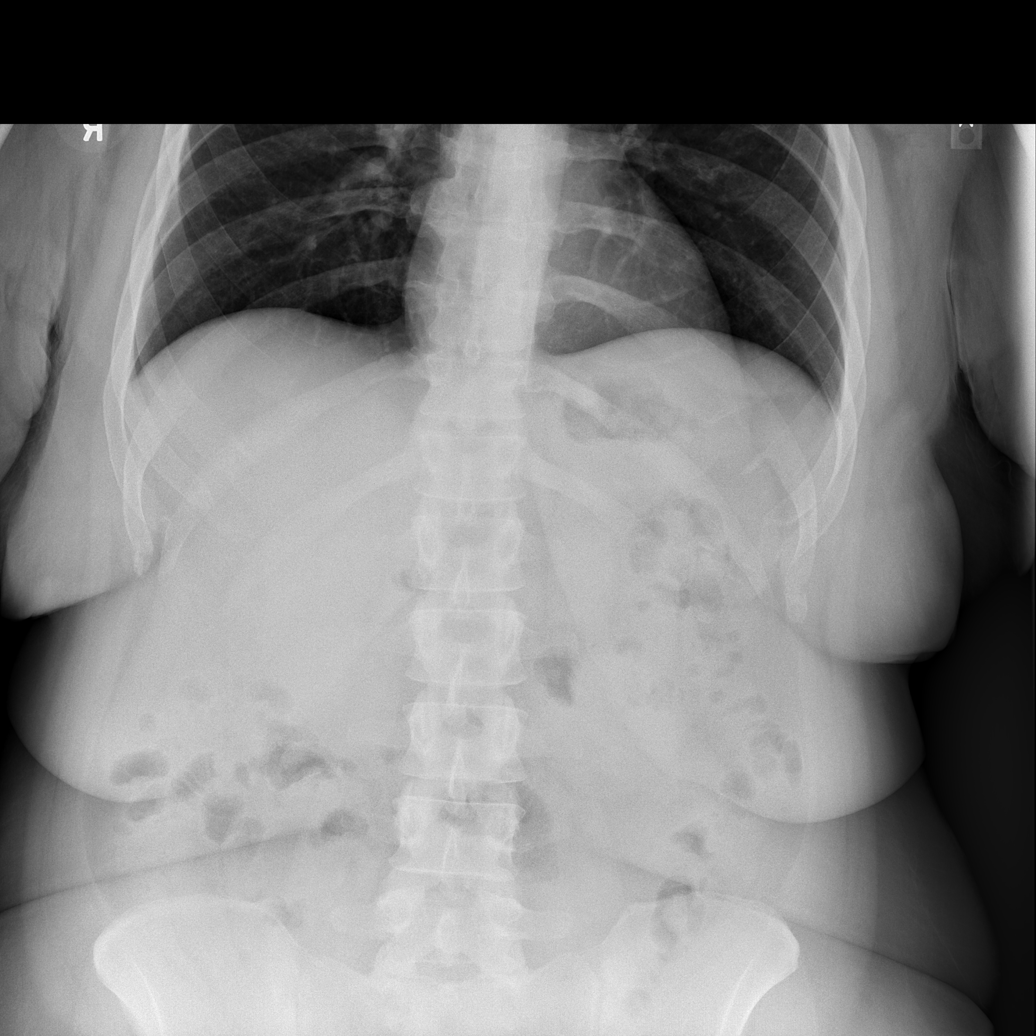

[t abdomen supine (1 of 2)]
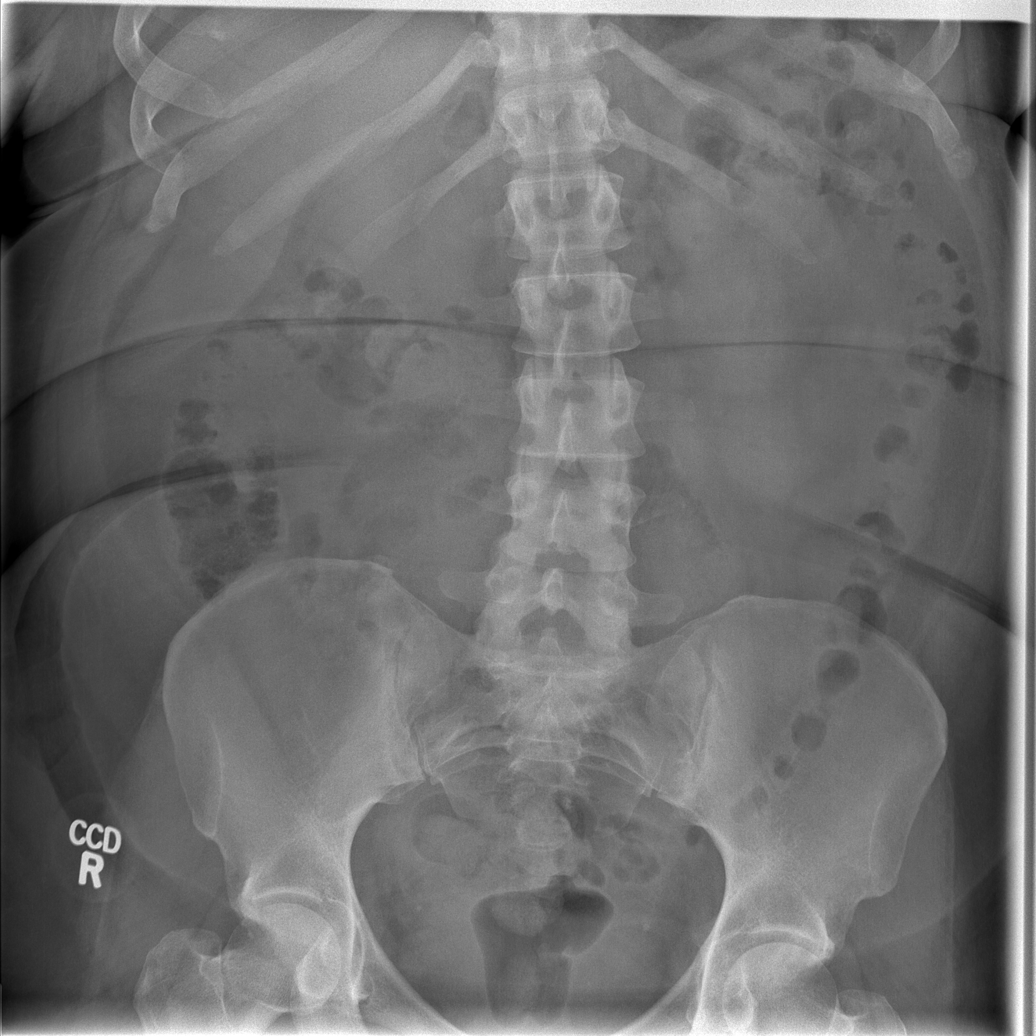

[t abdomen supine (2 of 2)]
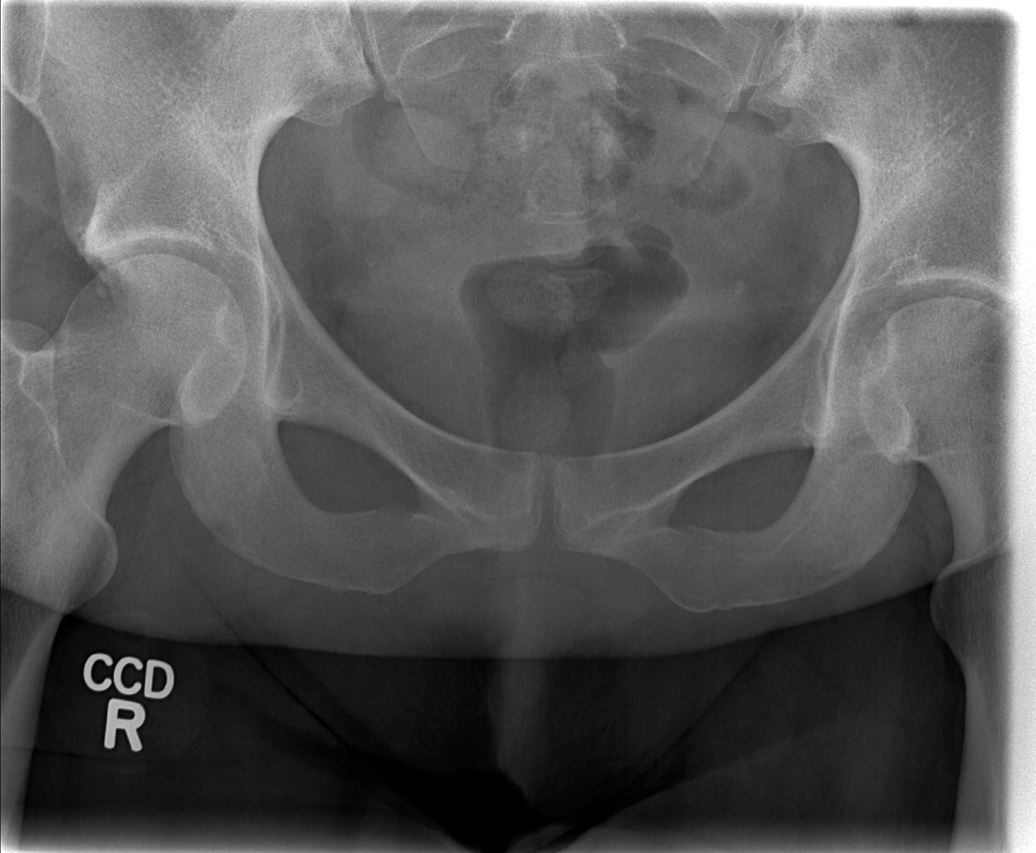

[4 of 4 positions shown; findings below may reference images not displayed]

FINDINGS: Cardiomediastinal silhouette is within normal limits.
The lungs are free of focal consolidations and pleural effusions.
No free intraperitoneal air.  Bowel gas pattern is nonobstructive.
No evidence for organomegaly or abnormal calcifications. Visualized
osseous structures have a normal appearance.
IMPRESSION: Negative exam.

## 2013-05-12 ENCOUNTER — Emergency Department (HOSPITAL_COMMUNITY)
Admission: EM | Admit: 2013-05-12 | Discharge: 2013-05-12 | Disposition: A | Discharge status: Home / Self Care | Payer: Medicaid Other | Attending: Emergency Medicine | Admitting: Emergency Medicine

## 2013-05-12 ENCOUNTER — Encounter (HOSPITAL_COMMUNITY): Payer: Self-pay | Admitting: Emergency Medicine

## 2013-05-12 DIAGNOSIS — R630 Anorexia: Secondary | ICD-10-CM | POA: Insufficient documentation

## 2013-05-12 DIAGNOSIS — Z8673 Personal history of transient ischemic attack (TIA), and cerebral infarction without residual deficits: Secondary | ICD-10-CM | POA: Insufficient documentation

## 2013-05-12 DIAGNOSIS — F411 Generalized anxiety disorder: Secondary | ICD-10-CM | POA: Insufficient documentation

## 2013-05-12 DIAGNOSIS — F3289 Other specified depressive episodes: Secondary | ICD-10-CM | POA: Insufficient documentation

## 2013-05-12 DIAGNOSIS — Z794 Long term (current) use of insulin: Secondary | ICD-10-CM | POA: Insufficient documentation

## 2013-05-12 DIAGNOSIS — Z9889 Other specified postprocedural states: Secondary | ICD-10-CM | POA: Insufficient documentation

## 2013-05-12 DIAGNOSIS — Z8619 Personal history of other infectious and parasitic diseases: Secondary | ICD-10-CM | POA: Insufficient documentation

## 2013-05-12 DIAGNOSIS — Z9861 Coronary angioplasty status: Secondary | ICD-10-CM | POA: Insufficient documentation

## 2013-05-12 DIAGNOSIS — IMO0002 Reserved for concepts with insufficient information to code with codable children: Secondary | ICD-10-CM | POA: Insufficient documentation

## 2013-05-12 DIAGNOSIS — Z87891 Personal history of nicotine dependence: Secondary | ICD-10-CM | POA: Insufficient documentation

## 2013-05-12 DIAGNOSIS — R109 Unspecified abdominal pain: Secondary | ICD-10-CM

## 2013-05-12 DIAGNOSIS — E1165 Type 2 diabetes mellitus with hyperglycemia: Secondary | ICD-10-CM | POA: Insufficient documentation

## 2013-05-12 DIAGNOSIS — Z87440 Personal history of urinary (tract) infections: Secondary | ICD-10-CM | POA: Insufficient documentation

## 2013-05-12 DIAGNOSIS — I2510 Atherosclerotic heart disease of native coronary artery without angina pectoris: Secondary | ICD-10-CM | POA: Insufficient documentation

## 2013-05-12 DIAGNOSIS — F329 Major depressive disorder, single episode, unspecified: Secondary | ICD-10-CM | POA: Insufficient documentation

## 2013-05-12 DIAGNOSIS — G43909 Migraine, unspecified, not intractable, without status migrainosus: Secondary | ICD-10-CM | POA: Insufficient documentation

## 2013-05-12 DIAGNOSIS — E669 Obesity, unspecified: Secondary | ICD-10-CM | POA: Insufficient documentation

## 2013-05-12 DIAGNOSIS — J45909 Unspecified asthma, uncomplicated: Secondary | ICD-10-CM | POA: Insufficient documentation

## 2013-05-12 DIAGNOSIS — Z872 Personal history of diseases of the skin and subcutaneous tissue: Secondary | ICD-10-CM | POA: Insufficient documentation

## 2013-05-12 DIAGNOSIS — N61 Mastitis without abscess: Secondary | ICD-10-CM | POA: Insufficient documentation

## 2013-05-12 DIAGNOSIS — I10 Essential (primary) hypertension: Secondary | ICD-10-CM | POA: Insufficient documentation

## 2013-05-12 DIAGNOSIS — G8929 Other chronic pain: Secondary | ICD-10-CM | POA: Insufficient documentation

## 2013-05-12 DIAGNOSIS — Z862 Personal history of diseases of the blood and blood-forming organs and certain disorders involving the immune mechanism: Secondary | ICD-10-CM | POA: Insufficient documentation

## 2013-05-12 DIAGNOSIS — Z79899 Other long term (current) drug therapy: Secondary | ICD-10-CM | POA: Insufficient documentation

## 2013-05-12 DIAGNOSIS — N611 Abscess of the breast and nipple: Secondary | ICD-10-CM

## 2013-05-12 DIAGNOSIS — E118 Type 2 diabetes mellitus with unspecified complications: Secondary | ICD-10-CM

## 2013-05-12 DIAGNOSIS — R1032 Left lower quadrant pain: Secondary | ICD-10-CM | POA: Insufficient documentation

## 2013-05-12 DIAGNOSIS — R112 Nausea with vomiting, unspecified: Secondary | ICD-10-CM | POA: Insufficient documentation

## 2013-05-12 DIAGNOSIS — K219 Gastro-esophageal reflux disease without esophagitis: Secondary | ICD-10-CM | POA: Insufficient documentation

## 2013-05-12 LAB — URINALYSIS, ROUTINE W REFLEX MICROSCOPIC
Glucose, UA: 500 mg/dL — AB
Hgb urine dipstick: NEGATIVE
KETONES UR: 15 mg/dL — AB
NITRITE: NEGATIVE
Protein, ur: 100 mg/dL — AB
SPECIFIC GRAVITY, URINE: 1.034 — AB (ref 1.005–1.030)
Urobilinogen, UA: 1 mg/dL (ref 0.0–1.0)
pH: 5.5 (ref 5.0–8.0)

## 2013-05-12 LAB — COMPREHENSIVE METABOLIC PANEL
ALBUMIN: 3.3 g/dL — AB (ref 3.5–5.2)
ALK PHOS: 65 U/L (ref 39–117)
ALT: <5 U/L (ref 0–35)
AST: 11 U/L (ref 0–37)
BUN: 10 mg/dL (ref 6–23)
CALCIUM: 9.6 mg/dL (ref 8.4–10.5)
CO2: 28 mEq/L (ref 19–32)
Chloride: 94 mEq/L — ABNORMAL LOW (ref 96–112)
Creatinine, Ser: 0.85 mg/dL (ref 0.50–1.10)
GFR calc non Af Amer: 85 mL/min — ABNORMAL LOW (ref >90)
Glucose, Bld: 289 mg/dL — ABNORMAL HIGH (ref 70–99)
Potassium: 3.6 mEq/L — ABNORMAL LOW (ref 3.7–5.3)
Sodium: 134 mEq/L — ABNORMAL LOW (ref 137–147)
TOTAL PROTEIN: 8.1 g/dL (ref 6.0–8.3)
Total Bilirubin: 0.4 mg/dL (ref 0.3–1.2)

## 2013-05-12 LAB — CBC WITH DIFFERENTIAL/PLATELET
BASOS ABS: 0 10*3/uL (ref 0.0–0.1)
BASOS PCT: 0 % (ref 0–1)
EOS ABS: 0.1 10*3/uL (ref 0.0–0.7)
Eosinophils Relative: 1 % (ref 0–5)
HEMATOCRIT: 32.1 % — AB (ref 36.0–46.0)
Hemoglobin: 10.3 g/dL — ABNORMAL LOW (ref 12.0–15.0)
LYMPHS ABS: 2.8 10*3/uL (ref 0.7–4.0)
Lymphocytes Relative: 20 % (ref 12–46)
MCH: 20.9 pg — ABNORMAL LOW (ref 26.0–34.0)
MCHC: 32.1 g/dL (ref 30.0–36.0)
MCV: 65 fL — ABNORMAL LOW (ref 78.0–100.0)
Monocytes Absolute: 1 10*3/uL (ref 0.1–1.0)
Monocytes Relative: 7 % (ref 3–12)
Neutro Abs: 10.1 10*3/uL — ABNORMAL HIGH (ref 1.7–7.7)
Neutrophils Relative %: 72 % (ref 43–77)
Platelets: 492 10*3/uL — ABNORMAL HIGH (ref 150–400)
RBC: 4.94 MIL/uL (ref 3.87–5.11)
RDW: 18.3 % — AB (ref 11.5–15.5)
WBC: 14 10*3/uL — ABNORMAL HIGH (ref 4.0–10.5)

## 2013-05-12 LAB — URINE MICROSCOPIC-ADD ON

## 2013-05-12 MED ORDER — SODIUM CHLORIDE 0.9 % IV BOLUS (SEPSIS)
1000.0000 mL | INTRAVENOUS | Status: AC
  Administered 2013-05-12: 1000 mL via INTRAVENOUS

## 2013-05-12 MED ORDER — MORPHINE SULFATE 4 MG/ML IJ SOLN
4.0000 mg | Freq: Once | INTRAMUSCULAR | Status: AC
  Administered 2013-05-12: 4 mg via INTRAVENOUS
  Filled 2013-05-12: qty 1

## 2013-05-12 MED ORDER — DIPHENHYDRAMINE HCL 50 MG/ML IJ SOLN
25.0000 mg | Freq: Once | INTRAMUSCULAR | Status: AC
Start: 2013-05-12 — End: 2013-05-12
  Administered 2013-05-12: 25 mg via INTRAVENOUS
  Filled 2013-05-12: qty 1

## 2013-05-12 MED ORDER — METOCLOPRAMIDE HCL 5 MG/ML IJ SOLN
10.0000 mg | Freq: Once | INTRAMUSCULAR | Status: AC
  Administered 2013-05-12: 10 mg via INTRAVENOUS
  Filled 2013-05-12: qty 2

## 2013-05-12 NOTE — ED Provider Notes (Signed)
Medical screening examination/treatment/procedure(s) were performed by non-physician practitioner and as supervising physician I was immediately available for consultation/collaboration.  EKG Interpretation   None        Jasper Riling. Alvino Chapel, MD 05/12/13 2350

## 2013-05-12 NOTE — ED Notes (Signed)
Writer gave pt a cup of ginger ale for fluid challenge

## 2013-05-12 NOTE — ED Notes (Signed)
Patient c/o N/V and lower abdominal pain x 2 days. Patient states she has vomited about 6 times today. Patient denies any diarrhea.

## 2013-05-12 NOTE — Discharge Instructions (Signed)
Continue taking home medications as prescribed by Dr. Benson Norway for abdominal pain, nausea and vomiting. Avoid fried fatty foods as well as dairy products including milk, cheese, and ice cream as these can make nausea and vomiting worse.  Abscess: be sure to keep abscess clean and covered. Use warm compresses such as a warm damp washcloth 3-4x daily to help area continue to drain.  Follow up with primary care in 2 days for recheck of wound if not improving, or return to ER for recheck if necessary.

## 2013-05-12 NOTE — ED Provider Notes (Signed)
CSN: [id number]     Arrival date & time 05/12/13  1612 History   First MD Initiated Contact with Patient 05/12/13 1751     Chief Complaint  Patient presents with  . Emesis  . Abdominal Pain   (Consider location/radiation/quality/duration/timing/severity/associated sxs/prior Treatment) HPI Pt is a 38yo female with hx of chronic abdominal pain and chronic n/v and recurrent boils. Pt c/o persistent nausea and vomiting with LLQ abdominal pain x2 days. Pt was seen on 1/15 for same, dx with trichomonas and discharged home with flagyl. Pt states she is unable to keep her medications down. States she has zofran, phenergan and oxycodone at home for symptoms but cannot keep down anything.  Pt states she called Dr. Benson Norway, GI, this morning and this afternoon telling him she could not keep down her medications. Pt was advised to come to ED and to call Dr. Benson Norway when she was discharged.   Pt also c/o boil under right breast she just noticed earlier today when she moved her breast. Pt states there is mild tenderness, worse with palpation. States she has had boils in same area that have required I&D in past. Denies active drainage. Denies fever. Denies nipple discharge, pain or mass.   Past Medical History  Diagnosis Date  . Thrombocytosis     Hem/Onc suggested 2/2 chronic hepatits and/or iron deficiency anemia  . Iron deficiency anemia   . N&V (nausea and vomiting)     Chronic. Unclear etiology with multiple admission and ED visits. CT abdomen with and without contrast (02/2011)  showed no acute process. Gastic Emptying scan (01/2010) was normal. Ultrasound of the abdomen was within normal limits. Hepatitis B viral load was undectable. HIV NR. EGD - gastritis, Hpylori + s/p Rx  . Abdominal pain   . Hypertension   . Hyperlipidemia   . Diabetes mellitus type 2, uncontrolled, with complications   . Recurrent boils   . Back pain   . OSA (obstructive sleep apnea)   . 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 Cath 12/10- no restenosis or significant CAD progression  . Irregular menses     Small ovarian follicles seen on [id number]  . History of pyelonephritis     H/o GrpB Pyelonephritis (9/06) and UTI- 07/11- E.Coli, 12/10- GBS  . Abscess of tunica vaginalis     10/09- Abundant S. aureus- sensitive to all abx  . Obesity   . Asthma   . Gastritis   . Depression   . Hepatitis B, chronic     Hep BeAb+,Hep B cAb+ & Hep BsAg+ (9/06)  . Peripheral neuropathy   . Fibromyalgia   . GERD (gastroesophageal reflux disease)   . Migraine   . Anxiety   . Gastroparesis     secondary to poorly controlled DM, last emptying study performed 01/2010  was normal but may be falsely positive as pt was on reglan  . Blood dyscrasia   . H/O: CVA (cerebrovascular accident)     history of remote right cerebellar infarct noted on head CT at least since 10/2011   Past Surgical History  Procedure Laterality Date  . Cesarean section  1997   Family History  Problem Relation Age of Onset  . Diabetes Father    History  Substance Use Topics  . Smoking status: Former Smoker    Types: Cigarettes    Quit date: 04/24/1996  . Smokeless  tobacco: Never Used     Comment: quit smoking cigarettes age 17  . Alcohol Use: Yes     Comment: occasionally   OB History   Grav Para Term Preterm Abortions TAB SAB Ect Mult Living                 Review of Systems  Constitutional: Positive for appetite change. Negative for fever and chills.  Gastrointestinal: Positive for nausea, vomiting and abdominal pain. Negative for diarrhea, constipation and blood in stool.  Genitourinary: Negative for dysuria, hematuria, flank pain, vaginal discharge, vaginal pain and pelvic pain.  All other systems reviewed and are negative.    Allergies  Lisinopril and Morphine and related  Home Medications   Current Outpatient Rx   Name  Route  Sig  Dispense  Refill  . albuterol (PROVENTIL HFA;VENTOLIN HFA) 108 (90 BASE) MCG/ACT inhaler   Inhalation   Inhale 2 puffs into the lungs every 4 (four) hours as needed for wheezing or shortness of breath.         Marland Kitchen amitriptyline (ELAVIL) 25 MG tablet   Oral   Take 2 tablets (50 mg total) by mouth at bedtime.   60 tablet   3   . amLODipine (NORVASC) 10 MG tablet   Oral   Take 1 tablet (10 mg total) by mouth daily.   30 tablet   6   . calcium carbonate (TUMS - DOSED IN MG ELEMENTAL CALCIUM) 500 MG chewable tablet   Oral   Chew 1 tablet by mouth daily.         . Fluticasone-Salmeterol (ADVAIR) 100-50 MCG/DOSE AEPB   Inhalation   Inhale 1 puff into the lungs every 12 (twelve) hours.   60 each   6   . gabapentin (NEURONTIN) 300 MG capsule   Oral   Take 300 mg by mouth 3 (three) times daily.         Marland Kitchen HYDROcodone-acetaminophen (NORCO/VICODIN) 5-325 MG per tablet   Oral   Take 1-2 tablets by mouth every 4 (four) hours as needed.   10 tablet   0   . insulin aspart (NOVOLOG) 100 UNIT/ML injection   Subcutaneous   Inject 2-12 Units into the skin 3 (three) times daily as needed for high blood sugar. Sliding scale- If sugar 150-199: inject 2 unit;  200-249: 4 units, 250-299: 7 units; 300-349: 10 units; Over 350: 12 units         . insulin glargine (LANTUS) 100 UNIT/ML injection   Subcutaneous   Inject 50 Units into the skin at bedtime.         . metroNIDAZOLE (FLAGYL) 500 MG tablet   Oral   Take 1 tablet (500 mg total) by mouth 2 (two) times daily. One po bid x 7 days   14 tablet   0   . omeprazole (PRILOSEC) 40 MG capsule   Oral   Take 1 capsule (40 mg total) by mouth daily.   31 capsule   6   . ondansetron (ZOFRAN ODT) 4 MG disintegrating tablet      4mg  ODT q4 hours prn nausea/vomit   12 tablet   0   . pregabalin (LYRICA) 75 MG capsule   Oral   Take 1 capsule (75 mg total) by mouth 2 (two) times daily.   60 capsule   6   .  promethazine (PHENERGAN) 25 MG tablet   Oral   Take 1 tablet (25 mg total) by mouth every 6 (six) hours  as needed for nausea or vomiting.   30 tablet   0    BP 112/76  Pulse 116  Temp(Src) 98.5 F (36.9 C) (Oral)  Resp 16  SpO2 100%  LMP 04/29/2013 Physical Exam  Nursing note and vitals reviewed. Constitutional: She appears well-developed and well-nourished. No distress.  Pt lying comfortably in exam bed, NAD.  HENT:  Head: Normocephalic and atraumatic.  Eyes: Conjunctivae are normal. No scleral icterus.  Neck: Normal range of motion.  Cardiovascular: Normal rate, regular rhythm and normal heart sounds.   Pulmonary/Chest: Effort normal and breath sounds normal. No respiratory distress. She has no wheezes. She has no rales. She exhibits no tenderness.  Abdominal: Soft. Bowel sounds are normal. She exhibits no distension and no mass. There is tenderness. There is no rebound, no guarding and no CVA tenderness.  Obese abdomen, soft, non-distended, mild tenderness in LLQ. No rebound, guarding, or masses.  Musculoskeletal: Normal range of motion.  Neurological: She is alert.  Skin: Skin is warm and dry. She is not diaphoretic. There is erythema.  2-3cm circular area of erythema, fluctuance on right lower breast. Mild tenderness. No red streaking, warmth or active drainage. No nipple drainage mass or tenderness.    ED Course  Procedures  INCISION AND DRAINAGE Performed by: Noland Fordyce A. Consent: Verbal consent obtained. Risks and benefits: risks, benefits and alternatives were discussed Type: abscess  Body area: right lower breast  Anesthesia: local infiltration  Incision was made with a scalpel.  Local anesthetic: lidocaine 2% with epinephrine  Anesthetic total: 2 ml  Complexity: complex Blunt dissection to break up loculations  Drainage: purulent  Drainage amount: 3cc  Packing material: none  Patient tolerance: Patient tolerated the procedure well with no  immediate complications.     Labs Review Labs Reviewed  CBC WITH DIFFERENTIAL - Abnormal; Notable for the following:    WBC 14.0 (*)    Hemoglobin 10.3 (*)    HCT 32.1 (*)    MCV 65.0 (*)    MCH 20.9 (*)    RDW 18.3 (*)    Platelets 492 (*)    Neutro Abs 10.1 (*)    All other components within normal limits  COMPREHENSIVE METABOLIC PANEL - Abnormal; Notable for the following:    Sodium 134 (*)    Potassium 3.6 (*)    Chloride 94 (*)    Glucose, Bld 289 (*)    Albumin 3.3 (*)    GFR calc non Af Amer 85 (*)    All other components within normal limits  URINALYSIS, ROUTINE W REFLEX MICROSCOPIC - Abnormal; Notable for the following:    Color, Urine AMBER (*)    APPearance TURBID (*)    Specific Gravity, Urine 1.034 (*)    Glucose, UA 500 (*)    Bilirubin Urine MODERATE (*)    Ketones, ur 15 (*)    Protein, ur 100 (*)    Leukocytes, UA SMALL (*)    All other components within normal limits  URINE MICROSCOPIC-ADD ON - Abnormal; Notable for the following:    Squamous Epithelial / LPF MANY (*)    Casts HYALINE CASTS (*)    All other components within normal limits   Imaging Review No results found.  EKG Interpretation   None       MDM   1. Nausea & vomiting   2. Chronic abdominal pain   3. Abscess of right breast    Pt with hx of chronic abdominal pain, n/v, and  recurrent boils presenting with persistent n/v x2 days. Pt also has abscess on right breast.  Abscess: I&D performed. No evidence of surrounding cellulitis. Do not believe antibiotics needed at this time.  N/v: pt has not vomited since arriving to ED but stated she could not keep down home medications, was advised to come to ED by Dr. Benson Norway, GI. Pt currently being tx for trichomonas, which she was dx with 4 days ago.    Labs: CBC, CMP: consistent with previous 4 days ago.   UA: no evidence of trich.  Pt denied vaginal symptoms. Abdominal pain was mild left abdomen. Negative pregnancy 4 days ago. Pt denies  urinary or vaginal symptoms.   Tx in ED: fluids, reglan and benadryl. Pt able to keep down several ounces of water.  States nausea improved. Pt still c/o abdominal pain and pain at site of I&D.  Morphine given.  Pt states she feels comfortable being discharged home to f/u with Dr. Benson Norway.  Return precautions provided. Advised to f/u in 2 days for recheck of abscess with PCP, or ED if needed. Pt verbalized understanding and agreement with tx plan.   Noland Fordyce, PA-C 05/12/13 2055

## 2013-05-13 ENCOUNTER — Encounter: Payer: Self-pay | Admitting: Obstetrics & Gynecology

## 2013-05-14 ENCOUNTER — Encounter: Payer: Self-pay | Admitting: Internal Medicine

## 2013-05-14 ENCOUNTER — Ambulatory Visit: Payer: Medicaid Other | Admitting: Internal Medicine

## 2013-05-19 ENCOUNTER — Encounter (HOSPITAL_COMMUNITY): Payer: Self-pay | Admitting: Emergency Medicine

## 2013-05-19 ENCOUNTER — Emergency Department (HOSPITAL_COMMUNITY)
Admission: EM | Admit: 2013-05-19 | Discharge: 2013-05-20 | Disposition: A | Discharge status: Home / Self Care | Payer: Medicaid Other | Attending: Emergency Medicine | Admitting: Emergency Medicine

## 2013-05-19 DIAGNOSIS — F411 Generalized anxiety disorder: Secondary | ICD-10-CM | POA: Insufficient documentation

## 2013-05-19 DIAGNOSIS — Z79899 Other long term (current) drug therapy: Secondary | ICD-10-CM | POA: Insufficient documentation

## 2013-05-19 DIAGNOSIS — R5383 Other fatigue: Secondary | ICD-10-CM

## 2013-05-19 DIAGNOSIS — E118 Type 2 diabetes mellitus with unspecified complications: Secondary | ICD-10-CM

## 2013-05-19 DIAGNOSIS — F329 Major depressive disorder, single episode, unspecified: Secondary | ICD-10-CM | POA: Insufficient documentation

## 2013-05-19 DIAGNOSIS — Z3202 Encounter for pregnancy test, result negative: Secondary | ICD-10-CM | POA: Insufficient documentation

## 2013-05-19 DIAGNOSIS — Z87448 Personal history of other diseases of urinary system: Secondary | ICD-10-CM | POA: Insufficient documentation

## 2013-05-19 DIAGNOSIS — IMO0002 Reserved for concepts with insufficient information to code with codable children: Secondary | ICD-10-CM | POA: Insufficient documentation

## 2013-05-19 DIAGNOSIS — Z8669 Personal history of other diseases of the nervous system and sense organs: Secondary | ICD-10-CM | POA: Insufficient documentation

## 2013-05-19 DIAGNOSIS — I10 Essential (primary) hypertension: Secondary | ICD-10-CM | POA: Insufficient documentation

## 2013-05-19 DIAGNOSIS — F3289 Other specified depressive episodes: Secondary | ICD-10-CM | POA: Insufficient documentation

## 2013-05-19 DIAGNOSIS — Z8619 Personal history of other infectious and parasitic diseases: Secondary | ICD-10-CM | POA: Insufficient documentation

## 2013-05-19 DIAGNOSIS — Z8739 Personal history of other diseases of the musculoskeletal system and connective tissue: Secondary | ICD-10-CM | POA: Insufficient documentation

## 2013-05-19 DIAGNOSIS — K219 Gastro-esophageal reflux disease without esophagitis: Secondary | ICD-10-CM | POA: Insufficient documentation

## 2013-05-19 DIAGNOSIS — N898 Other specified noninflammatory disorders of vagina: Secondary | ICD-10-CM | POA: Insufficient documentation

## 2013-05-19 DIAGNOSIS — Z87440 Personal history of urinary (tract) infections: Secondary | ICD-10-CM | POA: Insufficient documentation

## 2013-05-19 DIAGNOSIS — R079 Chest pain, unspecified: Secondary | ICD-10-CM | POA: Insufficient documentation

## 2013-05-19 DIAGNOSIS — Z87891 Personal history of nicotine dependence: Secondary | ICD-10-CM | POA: Insufficient documentation

## 2013-05-19 DIAGNOSIS — R630 Anorexia: Secondary | ICD-10-CM | POA: Insufficient documentation

## 2013-05-19 DIAGNOSIS — K5900 Constipation, unspecified: Secondary | ICD-10-CM | POA: Insufficient documentation

## 2013-05-19 DIAGNOSIS — R109 Unspecified abdominal pain: Secondary | ICD-10-CM | POA: Insufficient documentation

## 2013-05-19 DIAGNOSIS — Z8742 Personal history of other diseases of the female genital tract: Secondary | ICD-10-CM | POA: Insufficient documentation

## 2013-05-19 DIAGNOSIS — Z862 Personal history of diseases of the blood and blood-forming organs and certain disorders involving the immune mechanism: Secondary | ICD-10-CM | POA: Insufficient documentation

## 2013-05-19 DIAGNOSIS — J45909 Unspecified asthma, uncomplicated: Secondary | ICD-10-CM | POA: Insufficient documentation

## 2013-05-19 DIAGNOSIS — R1110 Vomiting, unspecified: Secondary | ICD-10-CM | POA: Insufficient documentation

## 2013-05-19 DIAGNOSIS — G43909 Migraine, unspecified, not intractable, without status migrainosus: Secondary | ICD-10-CM | POA: Insufficient documentation

## 2013-05-19 DIAGNOSIS — E669 Obesity, unspecified: Secondary | ICD-10-CM | POA: Insufficient documentation

## 2013-05-19 DIAGNOSIS — Z794 Long term (current) use of insulin: Secondary | ICD-10-CM | POA: Insufficient documentation

## 2013-05-19 DIAGNOSIS — R5381 Other malaise: Secondary | ICD-10-CM | POA: Insufficient documentation

## 2013-05-19 DIAGNOSIS — Z872 Personal history of diseases of the skin and subcutaneous tissue: Secondary | ICD-10-CM | POA: Insufficient documentation

## 2013-05-19 DIAGNOSIS — I2510 Atherosclerotic heart disease of native coronary artery without angina pectoris: Secondary | ICD-10-CM | POA: Insufficient documentation

## 2013-05-19 DIAGNOSIS — Z8673 Personal history of transient ischemic attack (TIA), and cerebral infarction without residual deficits: Secondary | ICD-10-CM | POA: Insufficient documentation

## 2013-05-19 DIAGNOSIS — E1165 Type 2 diabetes mellitus with hyperglycemia: Secondary | ICD-10-CM | POA: Insufficient documentation

## 2013-05-19 LAB — CBC WITH DIFFERENTIAL/PLATELET
BASOS PCT: 0 % (ref 0–1)
Basophils Absolute: 0 10*3/uL (ref 0.0–0.1)
EOS ABS: 0.3 10*3/uL (ref 0.0–0.7)
Eosinophils Relative: 2 % (ref 0–5)
HCT: 33 % — ABNORMAL LOW (ref 36.0–46.0)
HEMOGLOBIN: 11.1 g/dL — AB (ref 12.0–15.0)
Lymphocytes Relative: 20 % (ref 12–46)
Lymphs Abs: 3.3 10*3/uL (ref 0.7–4.0)
MCH: 21.5 pg — AB (ref 26.0–34.0)
MCHC: 33.6 g/dL (ref 30.0–36.0)
MCV: 63.8 fL — ABNORMAL LOW (ref 78.0–100.0)
MONO ABS: 1.2 10*3/uL — AB (ref 0.1–1.0)
Monocytes Relative: 7 % (ref 3–12)
NEUTROS ABS: 11.8 10*3/uL — AB (ref 1.7–7.7)
NEUTROS PCT: 71 % (ref 43–77)
Platelets: 557 10*3/uL — ABNORMAL HIGH (ref 150–400)
RBC: 5.17 MIL/uL — ABNORMAL HIGH (ref 3.87–5.11)
RDW: 18.3 % — AB (ref 11.5–15.5)
WBC: 16.6 10*3/uL — ABNORMAL HIGH (ref 4.0–10.5)

## 2013-05-19 LAB — COMPREHENSIVE METABOLIC PANEL
ALT: <5 U/L (ref 0–35)
AST: 15 U/L (ref 0–37)
Albumin: 3.5 g/dL (ref 3.5–5.2)
Alkaline Phosphatase: 71 U/L (ref 39–117)
BUN: 14 mg/dL (ref 6–23)
CALCIUM: 9.6 mg/dL (ref 8.4–10.5)
CO2: 22 mEq/L (ref 19–32)
CREATININE: 0.98 mg/dL (ref 0.50–1.10)
Chloride: 98 mEq/L (ref 96–112)
GFR, EST AFRICAN AMERICAN: 83 mL/min — AB (ref >90)
GFR, EST NON AFRICAN AMERICAN: 72 mL/min — AB (ref >90)
GLUCOSE: 237 mg/dL — AB (ref 70–99)
Potassium: 4.1 mEq/L (ref 3.7–5.3)
Sodium: 138 mEq/L (ref 137–147)
Total Bilirubin: 0.3 mg/dL (ref 0.3–1.2)
Total Protein: 8.6 g/dL — ABNORMAL HIGH (ref 6.0–8.3)

## 2013-05-19 LAB — POCT I-STAT TROPONIN I: Troponin i, poc: 0 ng/mL (ref 0.00–0.08)

## 2013-05-19 LAB — TROPONIN I

## 2013-05-19 LAB — LIPASE, BLOOD: LIPASE: 29 U/L (ref 11–59)

## 2013-05-19 MED ORDER — METOCLOPRAMIDE HCL 5 MG/ML IJ SOLN
10.0000 mg | Freq: Once | INTRAMUSCULAR | Status: AC
  Administered 2013-05-19: 10 mg via INTRAVENOUS
  Filled 2013-05-19: qty 2

## 2013-05-19 MED ORDER — ONDANSETRON HCL 4 MG/2ML IJ SOLN
4.0000 mg | Freq: Once | INTRAMUSCULAR | Status: AC
  Administered 2013-05-19: 4 mg via INTRAVENOUS
  Filled 2013-05-19: qty 2

## 2013-05-19 MED ORDER — SODIUM CHLORIDE 0.9 % IV BOLUS (SEPSIS)
1000.0000 mL | Freq: Once | INTRAVENOUS | Status: AC
  Administered 2013-05-19: 1000 mL via INTRAVENOUS

## 2013-05-19 MED ORDER — ONDANSETRON 8 MG PO TBDP
8.0000 mg | ORAL_TABLET | Freq: Once | ORAL | Status: AC
  Administered 2013-05-19: 8 mg via ORAL
  Filled 2013-05-19: qty 1

## 2013-05-19 NOTE — ED Notes (Signed)
PT reports abdominal pain with emesis since 1500, states that she attempted to take Phenergan but vomited immediately after, denies diarrhea, pt actively vomiting in triage. Pt a&o x4.

## 2013-05-19 NOTE — ED Provider Notes (Signed)
CSN: [id number]     Arrival date & time 05/19/13  2120 History   First MD Initiated Contact with Patient 05/19/13 2204     Chief Complaint  Patient presents with  . Emesis   (Consider location/radiation/quality/duration/timing/severity/associated sxs/prior Treatment) The history is provided by the patient. No language interpreter was used.  Shirley [name] is a 38 y/o F with PMhx of thrombocytosis, iron deficiency, nausea and vomiting, abdominal pain, hyperlipidemia, diabetes, back pain, coronary artery disease, gastroparesis, asthma presenting to the ED with nausea and vomiting that has been ongoing since 3:00 this afternoon-as per friend who accompanies the patient, reported that patient has not been eating or drinking anything since Wednesday. Patient reported that she's been vomiting at least more than 7 times per day mainly of food contents-NB/NB. Reported abdominal pain to be a constant squeezing, stabbing sensation without radiation. Reported chest pain localize the left side described as a sharp constant pain with associated symptoms of shortness of breath and difficulty breathing. Stated that she's been trying to take promethazine today with negative success-reports that every time she takes a medication shows an episode of emesis. Patient reported that she has been following gastroenterologist gas reported that she's been having continuous abdominal pain, nausea and vomiting for the past 2 years. Patient reported that she is due to get a colonoscopy next week. Denied fever, chills, neck pain, neck stiffness.  PCP Dr. Michail Sermon  Past Medical History  Diagnosis Date  . Thrombocytosis     Hem/Onc suggested 2/2 chronic hepatits and/or iron deficiency anemia  . Iron deficiency anemia   . N&V (nausea and vomiting)     Chronic. Unclear etiology with multiple admission and ED visits. CT abdomen with and without contrast (02/2011)  showed no acute process. Gastic Emptying scan (01/2010) was  normal. Ultrasound of the abdomen was within normal limits. Hepatitis B viral load was undectable. HIV NR. EGD - gastritis, Hpylori + s/p Rx  . Abdominal pain   . Hypertension   . Hyperlipidemia   . Diabetes mellitus type 2, uncontrolled, with complications   . Recurrent boils   . Back pain   . OSA (obstructive sleep apnea)   . 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 Cath 12/10- no restenosis or significant CAD progression  . Irregular menses     Small ovarian follicles seen on [id number])  . History of pyelonephritis     H/o GrpB Pyelonephritis (9/06) and UTI- 07/11- E.Coli, 12/10- GBS  . Abscess of tunica vaginalis     10/09- Abundant S. aureus- sensitive to all abx  . Obesity   . Asthma   . Gastritis   . Depression   . Hepatitis B, chronic     Hep BeAb+,Hep B cAb+ & Hep BsAg+ (9/06)  . Peripheral neuropathy   . Fibromyalgia   . GERD (gastroesophageal reflux disease)   . Migraine   . Anxiety   . Gastroparesis     secondary to poorly controlled DM, last emptying study performed 01/2010  was normal but may be falsely positive as pt was on reglan  . Blood dyscrasia   . H/O: CVA (cerebrovascular accident)     history of remote right cerebellar infarct noted on head CT at least since 10/2011   Past Surgical History  Procedure Laterality Date  . Cesarean section  1997   Family History  Problem Relation Age of  Onset  . Diabetes Father    History  Substance Use Topics  . Smoking status: Former Smoker    Types: Cigarettes    Quit date: 04/24/1996  . Smokeless tobacco: Never Used     Comment: quit smoking cigarettes age 45  . Alcohol Use: Yes     Comment: occasionally   OB History   Grav Para Term Preterm Abortions TAB SAB Ect Mult Living                 Review of Systems  Constitutional: Positive for appetite change.  Respiratory: Negative for shortness  of breath.   Cardiovascular: Positive for chest pain.  Gastrointestinal: Positive for nausea, vomiting, abdominal pain and constipation. Negative for diarrhea.  Genitourinary: Negative for decreased urine volume.  Neurological: Positive for weakness. Negative for headaches.  All other systems reviewed and are negative.    Allergies  Lisinopril and Morphine and related  Home Medications   Current Outpatient Rx  Name  Route  Sig  Dispense  Refill  . albuterol (PROVENTIL HFA;VENTOLIN HFA) 108 (90 BASE) MCG/ACT inhaler   Inhalation   Inhale 2 puffs into the lungs every 4 (four) hours as needed for wheezing or shortness of breath.         Marland Kitchen amitriptyline (ELAVIL) 25 MG tablet   Oral   Take 2 tablets (50 mg total) by mouth at bedtime.   60 tablet   3   . amLODipine (NORVASC) 10 MG tablet   Oral   Take 1 tablet (10 mg total) by mouth daily.   30 tablet   6   . calcium carbonate (TUMS - DOSED IN MG ELEMENTAL CALCIUM) 500 MG chewable tablet   Oral   Chew 1 tablet by mouth daily.         . Fluticasone-Salmeterol (ADVAIR) 100-50 MCG/DOSE AEPB   Inhalation   Inhale 1 puff into the lungs every 12 (twelve) hours.   60 each   6   . gabapentin (NEURONTIN) 300 MG capsule   Oral   Take 300 mg by mouth 3 (three) times daily.         Marland Kitchen HYDROcodone-acetaminophen (NORCO/VICODIN) 5-325 MG per tablet   Oral   Take 1-2 tablets by mouth every 4 (four) hours as needed.   10 tablet   0   . insulin aspart (NOVOLOG) 100 UNIT/ML injection   Subcutaneous   Inject 2-12 Units into the skin 3 (three) times daily as needed for high blood sugar. Sliding scale- If sugar 150-199: inject 2 unit;  200-249: 4 units, 250-299: 7 units; 300-349: 10 units; Over 350: 12 units         . insulin glargine (LANTUS) 100 UNIT/ML injection   Subcutaneous   Inject 50 Units into the skin at bedtime.         . metroNIDAZOLE (FLAGYL) 500 MG tablet   Oral   Take 1 tablet (500 mg total) by mouth 2 (two)  times daily. One po bid x 7 days   14 tablet   0   . omeprazole (PRILOSEC) 40 MG capsule   Oral   Take 1 capsule (40 mg total) by mouth daily.   31 capsule   6   . ondansetron (ZOFRAN ODT) 4 MG disintegrating tablet      4mg  ODT q4 hours prn nausea/vomit   12 tablet   0   . pregabalin (LYRICA) 75 MG capsule   Oral   Take 1 capsule (75 mg total)  by mouth 2 (two) times daily.   60 capsule   6   . promethazine (PHENERGAN) 25 MG tablet   Oral   Take 1 tablet (25 mg total) by mouth every 6 (six) hours as needed for nausea or vomiting.   30 tablet   0    BP 106/57  Pulse 100  Temp(Src) 98.8 F (37.1 C) (Oral)  Resp 26  SpO2 98%  LMP 04/29/2013 Physical Exam  Nursing note and vitals reviewed. Constitutional: She is oriented to person, place, and time. She appears well-developed and well-nourished. No distress.  HENT:  Head: Normocephalic and atraumatic.  Mouth/Throat: Oropharynx is clear and moist. No oropharyngeal exudate.  Eyes: Conjunctivae and EOM are normal. Pupils are equal, round, and reactive to light. Right eye exhibits no discharge. Left eye exhibits no discharge.  Neck: Normal range of motion. Neck supple.  Cardiovascular: Normal rate, regular rhythm and normal heart sounds.   Pulses:      Radial pulses are 2+ on the right side, and 2+ on the left side.       Dorsalis pedis pulses are 2+ on the right side, and 2+ on the left side.  Pulmonary/Chest: Effort normal and breath sounds normal. No respiratory distress. She has no wheezes. She has no rales.  Abdominal: Soft. Bowel sounds are normal. There is tenderness. There is no guarding.  Discomfort upon palpation to the suprapubic and bilateral lower quadrant regions.   Genitourinary: Vaginal discharge found.  Pelvic Exam: Negative swelling, erythema, inflammation, lesions, sores, fissures noted to the external genitalia. Negative inflammation, lesions, sores, fissures, masses noted to the vaginal canal. Positive  thick yellowish, whitish discharge noted from the cervical os. Negative friability noted to the cervix. Positive CMT. Negative bilateral adnexal tenderness.   Musculoskeletal: Normal range of motion.  Full ROM to upper and lower extremities without difficulty noted, negative ataxia noted.  Neurological: She is alert and oriented to person, place, and time. She exhibits normal muscle tone. Coordination normal.  Skin: Skin is warm and dry. No rash noted. She is not diaphoretic. No erythema.  Psychiatric: She has a normal mood and affect. Her behavior is normal. Thought content normal.    ED Course  Procedures (including critical care time)  Physical exam unable to be performed properly since patient has continuous retching and emesis.  This provider reviewed patient's chart. Patient is been seen numerous times in ED setting regarding continuous nausea and vomiting. Patient was made with the hospital on 04/29/2013 for trouble nausea and vomiting. Patient was seen and assessed for nausea and vomiting and generally 15 2015 and 05/12/2013. Patient has had numerous CT abdomen and pelvis is contrast performed at-last CT performed on 04/11/2013 with negative acute abnormalities noted.  CLINICAL DATA: Vomiting  EXAM:  CT ABDOMEN AND PELVIS WITH CONTRAST  TECHNIQUE:  Multidetector CT imaging of the abdomen and pelvis was performed  using the standard protocol following bolus administration of  intravenous contrast.  CONTRAST: 59mL OMNIPAQUE IOHEXOL 300 MG/ML SOLN, 170mL OMNIPAQUE  IOHEXOL 300 MG/ML SOLN  COMPARISON: 08/15/2012  FINDINGS:  Visualized lung bases clear. Unremarkable liver, gallbladder,  spleen, adrenal glands, kidneys, pancreas, abdominal aorta, portal  vein. Stomach is nondistended. Small bowel and colon are nondilated.  Normal appendix. Degenerative disc disease L5-S1. Urinary bladder is  incompletely distended and mildly thick-walled. Uterus and adnexal  regions unremarkable. No  ascites. No free air. No adenopathy  localized.  IMPRESSION:  1. No acute abdominal process.  Electronically Signed  By:  Oley Balm M.D.  On: 04/11/2013 18:48   Labs Review Labs Reviewed  CBC WITH DIFFERENTIAL - Abnormal; Notable for the following:    WBC 16.6 (*)    RBC 5.17 (*)    Hemoglobin 11.1 (*)    HCT 33.0 (*)    MCV 63.8 (*)    MCH 21.5 (*)    RDW 18.3 (*)    Platelets 557 (*)    Neutro Abs 11.8 (*)    Monocytes Absolute 1.2 (*)    All other components within normal limits  COMPREHENSIVE METABOLIC PANEL - Abnormal; Notable for the following:    Glucose, Bld 237 (*)    Total Protein 8.6 (*)    GFR calc non Af Amer 72 (*)    GFR calc Af Amer 83 (*)    All other components within normal limits  URINALYSIS, ROUTINE W REFLEX MICROSCOPIC - Abnormal; Notable for the following:    APPearance CLOUDY (*)    Glucose, UA >1000 (*)    Ketones, ur 15 (*)    Protein, ur 30 (*)    Leukocytes, UA MODERATE (*)    All other components within normal limits  URINE MICROSCOPIC-ADD ON - Abnormal; Notable for the following:    Squamous Epithelial / LPF MANY (*)    Bacteria, UA FEW (*)    All other components within normal limits  URINE CULTURE  LIPASE, BLOOD  TROPONIN I  POCT I-STAT TROPONIN I  POCT PREGNANCY, URINE   Imaging Review No results found.  EKG Interpretation    Date/Time:  Monday May 19 2013 21:47:19 EST Ventricular Rate:  131 PR Interval:  116 QRS Duration: 67 QT Interval:  301 QTC Calculation: 444 R Axis:   60 Text Interpretation:  Sinus tachycardia Ventricular premature complex Aberrant complex Consider anterior infarct Borderline T abnormalities, inferior leads Confirmed by GOLDSTON  MD, SCOTT ([id number]) on 05/19/2013 11:46:08 PM            MDM  No diagnosis found.  Medications  ondansetron (ZOFRAN-ODT) disintegrating tablet 8 mg (8 mg Oral Given 05/19/13 2201)  sodium chloride 0.9 % bolus 1,000 mL (1,000 mLs Intravenous New Bag/Given  05/19/13 2234)  ondansetron (ZOFRAN) injection 4 mg (4 mg Intravenous Given 05/19/13 2234)  metoCLOPramide (REGLAN) injection 10 mg (10 mg Intravenous Given 05/19/13 2342)   Filed Vitals:   05/19/13 2123 05/20/13 0115  BP: 169/95 106/57  Pulse: 145 100  Temp: 98.8 F (37.1 C)   TempSrc: Oral   Resp: 26   SpO2: 98% 98%    Patient presenting to emergency Department with nausea and vomiting has been ongoing since Wednesday. Patient reported that she's been unable to keep anything down. Reported abdominal pain described as a constant stabbing/squeezing sensation without radiation. Reported chest pain to be localized left-sided chest described as a sharp, constant discomfort without radiation with associated symptoms of difficulty breathing, shortness of breath. Patient is been seen numerous times regarding continuous abdominal pain, nausea and vomiting. Patient reported that the symptoms the been ongoing for the past 2 years. Patient was last seen in the emergency department on January 19 and 05/08/2013 regarding similar symptoms and presentation. Patient was admitted to hospital on 04/29/2013 for intractable nausea and vomiting. Patient had numerous CT abdomen pelvis is performed with negative findings. Most recent CT abdomen and pelvis was performed on 04/11/2013 with negative findings. Alert and oriented. GCS 15. Lungs clear to auscultation. Patient actively retching and vomiting while this provider tried to assess the patient.  Patient re-assessed when more calm and sleeping. BS normoactive in all 4 quadrants. Discomfort upon palpation to the suprapubic, LLQ, and RLQ. Pelvic exam noted thick whitish, yellow discharge from the cervical os with CMT tenderness. Negative bilateral adnexal tenderness noted.  EKG noted sinus tachycardia with ventricular premature complexes. Troponin negative elevation noted. CBC noted elevated WBC of 16.6 - with negative leukocytosis noted. CMP negative findings noted.  Lipase negative elevation. Urine pregnancy negative. UA noted to have moderate leukocytes, with many squamous cells and positive trichomonas in her urine. US pelvis pending.  If pelvic US negative for acute findings and patient able to tolerate PO patient to be discharged. If patient unable to tolerate PO patient to be admitted for intractable nausea and vomiting. Discussed case with Junius Creamer, NP at change in shift. Transfer of care to Junius Creamer, NP at change in shift.   Jamse Mead, PA-C 05/20/13 1132

## 2013-05-20 ENCOUNTER — Emergency Department (HOSPITAL_COMMUNITY): Payer: Medicaid Other

## 2013-05-20 LAB — URINALYSIS, ROUTINE W REFLEX MICROSCOPIC
Bilirubin Urine: NEGATIVE
Glucose, UA: >1000 mg/dL — AB
Hgb urine dipstick: NEGATIVE
Ketones, ur: 15 mg/dL — AB
Nitrite: NEGATIVE
PROTEIN: 30 mg/dL — AB
Specific Gravity, Urine: 1.029 (ref 1.005–1.030)
UROBILINOGEN UA: 1 mg/dL (ref 0.0–1.0)
pH: 7.5 (ref 5.0–8.0)

## 2013-05-20 LAB — URINE MICROSCOPIC-ADD ON

## 2013-05-20 LAB — GC/CHLAMYDIA PROBE AMP
CT Probe RNA: NEGATIVE
GC Probe RNA: NEGATIVE

## 2013-05-20 LAB — WET PREP, GENITAL
CLUE CELLS WET PREP: NONE SEEN
Yeast Wet Prep HPF POC: NONE SEEN

## 2013-05-20 LAB — POCT PREGNANCY, URINE: Preg Test, Ur: NEGATIVE

## 2013-05-20 MED ORDER — LORAZEPAM 2 MG/ML IJ SOLN
2.0000 mg | Freq: Once | INTRAMUSCULAR | Status: AC
  Administered 2013-05-20: 2 mg via INTRAVENOUS
  Filled 2013-05-20: qty 1

## 2013-05-20 MED ORDER — LORAZEPAM 1 MG PO TABS: 1.0000 mg | ORAL_TABLET | Freq: Three times a day (TID) | ORAL | Status: DC | PRN

## 2013-05-20 NOTE — ED Notes (Signed)
Pt continues to c/o pain. Pt snoring before leaving the room. Continue to monitor.

## 2013-05-20 NOTE — ED Notes (Signed)
Pt refusing to drink fluid bolus.

## 2013-05-20 NOTE — Discharge Instructions (Signed)
Nausea and Vomiting Nausea is a sick feeling that often comes before throwing up (vomiting). Vomiting is a reflex where stomach contents come out of your mouth. Vomiting can cause severe loss of body fluids (dehydration). Children and elderly adults can become dehydrated quickly, especially if they also have diarrhea. Nausea and vomiting are symptoms of a condition or disease. It is important to find the cause of your symptoms. CAUSES   Direct irritation of the stomach lining. This irritation can result from increased acid production (gastroesophageal reflux disease), infection, food poisoning, taking certain medicines (such as nonsteroidal anti-inflammatory drugs), alcohol use, or tobacco use.  Signals from the brain.These signals could be caused by a headache, heat exposure, an inner ear disturbance, increased pressure in the brain from injury, infection, a tumor, or a concussion, pain, emotional stimulus, or metabolic problems.  An obstruction in the gastrointestinal tract (bowel obstruction).  Illnesses such as diabetes, hepatitis, gallbladder problems, appendicitis, kidney problems, cancer, sepsis, atypical symptoms of a heart attack, or eating disorders.  Medical treatments such as chemotherapy and radiation.  Receiving medicine that makes you sleep (general anesthetic) during surgery. DIAGNOSIS Your caregiver may ask for tests to be done if the problems do not improve after a few days. Tests may also be done if symptoms are severe or if the reason for the nausea and vomiting is not clear. Tests may include:  Urine tests.  Blood tests.  Stool tests.  Cultures (to look for evidence of infection).  X-rays or other imaging studies. Test results can help your caregiver make decisions about treatment or the need for additional tests. TREATMENT You need to stay well hydrated. Drink frequently but in small amounts.You may wish to drink water, sports drinks, clear broth, or eat frozen  ice pops or gelatin dessert to help stay hydrated.When you eat, eating slowly may help prevent nausea.There are also some antinausea medicines that may help prevent nausea. HOME CARE INSTRUCTIONS   Take all medicine as directed by your caregiver.  If you do not have an appetite, do not force yourself to eat. However, you must continue to drink fluids.  If you have an appetite, eat a normal diet unless your caregiver tells you differently.  Eat a variety of complex carbohydrates (rice, wheat, potatoes, bread), lean meats, yogurt, fruits, and vegetables.  Avoid high-fat foods because they are more difficult to digest.  Drink enough water and fluids to keep your urine clear or pale yellow.  If you are dehydrated, ask your caregiver for specific rehydration instructions. Signs of dehydration may include:  Severe thirst.  Dry lips and mouth.  Dizziness.  Dark urine.  Decreasing urine frequency and amount.  Confusion.  Rapid breathing or pulse. SEEK IMMEDIATE MEDICAL CARE IF:   You have blood or brown flecks (like coffee grounds) in your vomit.  You have black or bloody stools.  You have a severe headache or stiff neck.  You are confused.  You have severe abdominal pain.  You have chest pain or trouble breathing.  You do not urinate at least once every 8 hours.  You develop cold or clammy skin.  You continue to vomit for longer than 24 to 48 hours.  You have a fever. MAKE SURE YOU:   Understand these instructions.  Will watch your condition.  Will get help right away if you are not doing well or get worse. Document Released: 04/10/2005 Document Revised: 07/03/2011 Document Reviewed: 09/07/2010 [facility] Patient Information 2014 [place], [place]. You can take the Ativan.  For nausea and vomiting.  Follow up with your primary care physician

## 2013-05-20 NOTE — ED Provider Notes (Signed)
Patient's ultrasound, is normal.  She has received significant relief from her nausea with Ativan.  She is now willing to partake in a fluid challenge Patient is tolerating by mouth she will be discharged home with prescription for Ativan that she can use for nausea.  Followup with her primary care physician  Garald Balding, NP 05/20/13 0459  Garald Balding, NP 05/20/13 [phone]

## 2013-05-20 NOTE — ED Provider Notes (Signed)
Medical screening examination/treatment/procedure(s) were performed by non-physician practitioner and as supervising physician I was immediately available for consultation/collaboration.  EKG Interpretation    Date/Time:  Monday May 19 2013 21:47:19 EST Ventricular Rate:  131 PR Interval:  116 QRS Duration: 67 QT Interval:  301 QTC Calculation: 444 R Axis:   60 Text Interpretation:  Sinus tachycardia Ventricular premature complex Aberrant complex Consider anterior infarct Borderline T abnormalities, inferior leads Confirmed by Regenia Skeeter  MD, SCOTT ([id number]) on 05/19/2013 11:46:08 PM              Osvaldo Shipper, MD 05/20/13 [phone]

## 2013-05-20 NOTE — ED Notes (Signed)
Pt given ginger ale.

## 2013-05-21 LAB — URINE CULTURE: Colony Count: >=100000

## 2013-05-21 NOTE — ED Provider Notes (Signed)
Medical screening examination/treatment/procedure(s) were performed by non-physician practitioner and as supervising physician I was immediately available for consultation/collaboration.  EKG Interpretation    Date/Time:  Monday May 19 2013 21:47:19 EST Ventricular Rate:  131 PR Interval:  116 QRS Duration: 67 QT Interval:  301 QTC Calculation: 444 R Axis:   60 Text Interpretation:  Sinus tachycardia Ventricular premature complex Aberrant complex Consider anterior infarct Borderline T abnormalities, inferior leads Confirmed by Regenia Skeeter  MD, Dearra Myhand ([id number]) on 05/19/2013 11:46:08 PM              Ephraim Hamburger, MD 05/21/13 1009

## 2013-05-23 ENCOUNTER — Encounter (HOSPITAL_COMMUNITY): Payer: Self-pay | Admitting: Emergency Medicine

## 2013-05-23 ENCOUNTER — Emergency Department (HOSPITAL_COMMUNITY)
Admission: EM | Admit: 2013-05-23 | Discharge: 2013-05-24 | Disposition: A | Discharge status: Home / Self Care | Payer: Medicaid Other | Attending: Emergency Medicine | Admitting: Emergency Medicine

## 2013-05-23 DIAGNOSIS — J45909 Unspecified asthma, uncomplicated: Secondary | ICD-10-CM | POA: Insufficient documentation

## 2013-05-23 DIAGNOSIS — K219 Gastro-esophageal reflux disease without esophagitis: Secondary | ICD-10-CM | POA: Insufficient documentation

## 2013-05-23 DIAGNOSIS — R1110 Vomiting, unspecified: Secondary | ICD-10-CM

## 2013-05-23 DIAGNOSIS — D72829 Elevated white blood cell count, unspecified: Secondary | ICD-10-CM

## 2013-05-23 DIAGNOSIS — F329 Major depressive disorder, single episode, unspecified: Secondary | ICD-10-CM | POA: Insufficient documentation

## 2013-05-23 DIAGNOSIS — K3184 Gastroparesis: Secondary | ICD-10-CM | POA: Insufficient documentation

## 2013-05-23 DIAGNOSIS — F3289 Other specified depressive episodes: Secondary | ICD-10-CM | POA: Insufficient documentation

## 2013-05-23 DIAGNOSIS — R000 Tachycardia, unspecified: Secondary | ICD-10-CM | POA: Insufficient documentation

## 2013-05-23 DIAGNOSIS — Z8742 Personal history of other diseases of the female genital tract: Secondary | ICD-10-CM | POA: Insufficient documentation

## 2013-05-23 DIAGNOSIS — Z794 Long term (current) use of insulin: Secondary | ICD-10-CM | POA: Insufficient documentation

## 2013-05-23 DIAGNOSIS — E669 Obesity, unspecified: Secondary | ICD-10-CM | POA: Insufficient documentation

## 2013-05-23 DIAGNOSIS — IMO0002 Reserved for concepts with insufficient information to code with codable children: Secondary | ICD-10-CM | POA: Insufficient documentation

## 2013-05-23 DIAGNOSIS — I252 Old myocardial infarction: Secondary | ICD-10-CM | POA: Insufficient documentation

## 2013-05-23 DIAGNOSIS — IMO0001 Reserved for inherently not codable concepts without codable children: Secondary | ICD-10-CM | POA: Insufficient documentation

## 2013-05-23 DIAGNOSIS — I2510 Atherosclerotic heart disease of native coronary artery without angina pectoris: Secondary | ICD-10-CM | POA: Insufficient documentation

## 2013-05-23 DIAGNOSIS — Z87448 Personal history of other diseases of urinary system: Secondary | ICD-10-CM | POA: Insufficient documentation

## 2013-05-23 DIAGNOSIS — E118 Type 2 diabetes mellitus with unspecified complications: Secondary | ICD-10-CM

## 2013-05-23 DIAGNOSIS — G609 Hereditary and idiopathic neuropathy, unspecified: Secondary | ICD-10-CM | POA: Insufficient documentation

## 2013-05-23 DIAGNOSIS — G43909 Migraine, unspecified, not intractable, without status migrainosus: Secondary | ICD-10-CM | POA: Insufficient documentation

## 2013-05-23 DIAGNOSIS — E1165 Type 2 diabetes mellitus with hyperglycemia: Secondary | ICD-10-CM

## 2013-05-23 DIAGNOSIS — Z79899 Other long term (current) drug therapy: Secondary | ICD-10-CM | POA: Insufficient documentation

## 2013-05-23 DIAGNOSIS — Z8673 Personal history of transient ischemic attack (TIA), and cerebral infarction without residual deficits: Secondary | ICD-10-CM | POA: Insufficient documentation

## 2013-05-23 DIAGNOSIS — B181 Chronic viral hepatitis B without delta-agent: Secondary | ICD-10-CM | POA: Insufficient documentation

## 2013-05-23 DIAGNOSIS — E1149 Type 2 diabetes mellitus with other diabetic neurological complication: Secondary | ICD-10-CM | POA: Insufficient documentation

## 2013-05-23 DIAGNOSIS — Z87891 Personal history of nicotine dependence: Secondary | ICD-10-CM | POA: Insufficient documentation

## 2013-05-23 DIAGNOSIS — F411 Generalized anxiety disorder: Secondary | ICD-10-CM | POA: Insufficient documentation

## 2013-05-23 DIAGNOSIS — I10 Essential (primary) hypertension: Secondary | ICD-10-CM | POA: Insufficient documentation

## 2013-05-23 DIAGNOSIS — Z872 Personal history of diseases of the skin and subcutaneous tissue: Secondary | ICD-10-CM | POA: Insufficient documentation

## 2013-05-23 LAB — COMPREHENSIVE METABOLIC PANEL
ALBUMIN: 3.3 g/dL — AB (ref 3.5–5.2)
ALT: 5 U/L (ref 0–35)
AST: 10 U/L (ref 0–37)
Alkaline Phosphatase: 70 U/L (ref 39–117)
BILIRUBIN TOTAL: 0.3 mg/dL (ref 0.3–1.2)
BUN: 8 mg/dL (ref 6–23)
CHLORIDE: 98 meq/L (ref 96–112)
CO2: 22 mEq/L (ref 19–32)
Calcium: 9.3 mg/dL (ref 8.4–10.5)
Creatinine, Ser: 0.8 mg/dL (ref 0.50–1.10)
GFR calc Af Amer: >90 mL/min (ref >90)
GFR calc non Af Amer: >90 mL/min (ref >90)
Glucose, Bld: 254 mg/dL — ABNORMAL HIGH (ref 70–99)
Potassium: 3.8 mEq/L (ref 3.7–5.3)
Sodium: 135 mEq/L — ABNORMAL LOW (ref 137–147)
Total Protein: 7.9 g/dL (ref 6.0–8.3)

## 2013-05-23 LAB — LIPASE, BLOOD: LIPASE: 35 U/L (ref 11–59)

## 2013-05-23 LAB — CBC
HCT: 31.4 % — ABNORMAL LOW (ref 36.0–46.0)
Hemoglobin: 10.4 g/dL — ABNORMAL LOW (ref 12.0–15.0)
MCH: 21.4 pg — ABNORMAL LOW (ref 26.0–34.0)
MCHC: 33.1 g/dL (ref 30.0–36.0)
MCV: 64.5 fL — AB (ref 78.0–100.0)
PLATELETS: 484 10*3/uL — AB (ref 150–400)
RBC: 4.87 MIL/uL (ref 3.87–5.11)
RDW: 18.6 % — AB (ref 11.5–15.5)
WBC: 15.6 10*3/uL — AB (ref 4.0–10.5)

## 2013-05-23 MED ORDER — HALOPERIDOL LACTATE 5 MG/ML IJ SOLN
2.0000 mg | Freq: Once | INTRAMUSCULAR | Status: AC
  Administered 2013-05-23: 2 mg via INTRAVENOUS
  Filled 2013-05-23: qty 1

## 2013-05-23 MED ORDER — SODIUM CHLORIDE 0.9 % IV BOLUS (SEPSIS)
1000.0000 mL | Freq: Once | INTRAVENOUS | Status: AC
  Administered 2013-05-24: 1000 mL via INTRAVENOUS

## 2013-05-23 MED ORDER — HYDROMORPHONE HCL PF 1 MG/ML IJ SOLN
1.0000 mg | Freq: Once | INTRAMUSCULAR | Status: AC
  Administered 2013-05-24: 1 mg via INTRAVENOUS
  Filled 2013-05-23: qty 1

## 2013-05-23 NOTE — ED Provider Notes (Signed)
CSN: [id number]     Arrival date & time 05/23/13  2157 History   First MD Initiated Contact with Patient 05/23/13 2159     Chief Complaint  Patient presents with  . Nausea  . Emesis   (Consider location/radiation/quality/duration/timing/severity/associated sxs/prior Treatment) Patient is a 38 y.o. female presenting with vomiting. The history is provided by the patient and medical records.  Emesis Associated symptoms: abdominal pain   Associated symptoms: no diarrhea     Shirley [name] is a 38 y.o. female  with a hx of gastroparesis, nausea vomiting, or deficiency anemia and abdominal pain, hypertension, type 2 diabetes uncontrolled, chronic hepatitis B, fibromyalgia, anxiety, depression presents to the Emergency Department complaining of gradual, persistent, progressively worsening left lower quadrant abdominal pain with associated nausea and vomiting onset several days ago.   Patient reports she's taking Reglan, Phenergan and Zofran at home without relief.  Pt has a long standing Hx of the same with multiple evaluations and no known etiology.  She reports that today's pain is the same as all previous episodes.  She reports that eating makes her pain worse and nothing seems to make it better. She reports that almost anything will trigger her emesis episodes including stress or foul smells. Patient history limited as she is currently vomiting.    Past Medical History  Diagnosis Date  . Thrombocytosis     Hem/Onc suggested 2/2 chronic hepatits and/or iron deficiency anemia  . Iron deficiency anemia   . N&V (nausea and vomiting)     Chronic. Unclear etiology with multiple admission and ED visits. CT abdomen with and without contrast (02/2011)  showed no acute process. Gastic Emptying scan (01/2010) was normal. Ultrasound of the abdomen was within normal limits. Hepatitis B viral load was undectable. HIV NR. EGD - gastritis, Hpylori + s/p Rx  . Abdominal pain   . Hypertension   .  Hyperlipidemia   . Diabetes mellitus type 2, uncontrolled, with complications   . Recurrent boils   . Back pain   . OSA (obstructive sleep apnea)   . 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 Cath 12/10- no restenosis or significant CAD progression  . Irregular menses     Small ovarian follicles seen on [id number])  . History of pyelonephritis     H/o GrpB Pyelonephritis (9/06) and UTI- 07/11- E.Coli, 12/10- GBS  . Abscess of tunica vaginalis     10/09- Abundant S. aureus- sensitive to all abx  . Obesity   . Asthma   . Gastritis   . Depression   . Hepatitis B, chronic     Hep BeAb+,Hep B cAb+ & Hep BsAg+ (9/06)  . Peripheral neuropathy   . Fibromyalgia   . GERD (gastroesophageal reflux disease)   . Migraine   . Anxiety   . Gastroparesis     secondary to poorly controlled DM, last emptying study performed 01/2010  was normal but may be falsely positive as pt was on reglan  . Blood dyscrasia   . H/O: CVA (cerebrovascular accident)     history of remote right cerebellar infarct noted on head CT at least since 10/2011   Past Surgical History  Procedure Laterality Date  . Cesarean section  1997   Family History  Problem Relation Age of Onset  . Diabetes Father    History  Substance Use Topics  . Smoking status: Former Smoker  Types: Cigarettes    Quit date: 04/24/1996  . Smokeless tobacco: Never Used     Comment: quit smoking cigarettes age 11  . Alcohol Use: Yes     Comment: occasionally   OB History   Grav Para Term Preterm Abortions TAB SAB Ect Mult Living                 Review of Systems  Constitutional: Negative for fever, diaphoresis, appetite change, fatigue and unexpected weight change.  HENT: Negative for mouth sores and trouble swallowing.   Respiratory: Negative for cough, chest tightness, shortness of breath, wheezing and stridor.    Cardiovascular: Negative for chest pain and palpitations.  Gastrointestinal: Positive for nausea, vomiting and abdominal pain. Negative for diarrhea, constipation, blood in stool, abdominal distention and rectal pain.  Genitourinary: Negative for dysuria, urgency, frequency, hematuria, flank pain and difficulty urinating.  Musculoskeletal: Negative for back pain, neck pain and neck stiffness.  Skin: Negative for rash.  Neurological: Negative for weakness.  Hematological: Negative for adenopathy.  Psychiatric/Behavioral: Negative for confusion.  All other systems reviewed and are negative.    Allergies  Lisinopril and Morphine and related  Home Medications   Current Outpatient Rx  Name  Route  Sig  Dispense  Refill  . albuterol (PROVENTIL HFA;VENTOLIN HFA) 108 (90 BASE) MCG/ACT inhaler   Inhalation   Inhale 2 puffs into the lungs every 4 (four) hours as needed for wheezing or shortness of breath.         Marland Kitchen amitriptyline (ELAVIL) 25 MG tablet   Oral   Take 2 tablets (50 mg total) by mouth at bedtime.   60 tablet   3   . amLODipine (NORVASC) 10 MG tablet   Oral   Take 1 tablet (10 mg total) by mouth daily.   30 tablet   6   . calcium carbonate (TUMS - DOSED IN MG ELEMENTAL CALCIUM) 500 MG chewable tablet   Oral   Chew 1 tablet by mouth daily.         . Fluticasone-Salmeterol (ADVAIR) 100-50 MCG/DOSE AEPB   Inhalation   Inhale 1 puff into the lungs every 12 (twelve) hours.   60 each   6   . gabapentin (NEURONTIN) 300 MG capsule   Oral   Take 300 mg by mouth 3 (three) times daily.         Marland Kitchen HYDROcodone-acetaminophen (NORCO/VICODIN) 5-325 MG per tablet   Oral   Take 1-2 tablets by mouth every 4 (four) hours as needed for moderate pain.         Marland Kitchen insulin aspart (NOVOLOG) 100 UNIT/ML injection   Subcutaneous   Inject 2-12 Units into the skin 3 (three) times daily as needed for high blood sugar. Sliding scale- If sugar 150-199: inject 2 unit;  200-249: 4 units,  250-299: 7 units; 300-349: 10 units; Over 350: 12 units         . insulin glargine (LANTUS) 100 UNIT/ML injection   Subcutaneous   Inject 50 Units into the skin at bedtime.         Marland Kitchen LORazepam (ATIVAN) 1 MG tablet   Oral   Take 1 tablet (1 mg total) by mouth 3 (three) times daily as needed for anxiety.   15 tablet   0   . omeprazole (PRILOSEC) 40 MG capsule   Oral   Take 1 capsule (40 mg total) by mouth daily.   31 capsule   6   . ondansetron (ZOFRAN-ODT) 4 MG  disintegrating tablet   Oral   Take 4 mg by mouth every 4 (four) hours as needed for nausea or vomiting.         . pregabalin (LYRICA) 75 MG capsule   Oral   Take 1 capsule (75 mg total) by mouth 2 (two) times daily.   60 capsule   6   . promethazine (PHENERGAN) 25 MG tablet   Oral   Take 1 tablet (25 mg total) by mouth every 6 (six) hours as needed for nausea or vomiting.   30 tablet   0   . ondansetron (ZOFRAN ODT) 8 MG disintegrating tablet      8mg  ODT q4 hours prn nausea   12 tablet   1    LMP 04/29/2013 Physical Exam  Nursing note and vitals reviewed. Constitutional: She appears well-developed and well-nourished. She appears distressed.  Awake, alert,  Patient appears distressed, rolling all over the bed and vomiting loudly  HENT:  Head: Normocephalic and atraumatic.  Mouth/Throat: Oropharynx is clear and moist. No oropharyngeal exudate.  Eyes: Conjunctivae are normal. No scleral icterus.  Neck: Normal range of motion. Neck supple.  Cardiovascular: Regular rhythm, normal heart sounds and intact distal pulses.   No murmur heard. Tachycardia  Pulmonary/Chest: Effort normal and breath sounds normal. No respiratory distress. She has no wheezes.  Clear and equal breath sounds  Abdominal: Soft. Bowel sounds are normal. She exhibits no distension and no mass. There is no tenderness. There is no rebound, no guarding and no CVA tenderness.  Abdomen soft and nontender without guarding, rigidity or  peritoneal signs No CVA tenderness  Musculoskeletal: Normal range of motion. She exhibits no edema.  Lymphadenopathy:    She has no cervical adenopathy.  Neurological: She is alert.  Speech is clear and goal oriented Moves extremities without ataxia  Skin: Skin is warm and dry. She is not diaphoretic.  Psychiatric: She has a normal mood and affect.    ED Course  Procedures (including critical care time) Labs Review Labs Reviewed  CBC - Abnormal; Notable for the following:    WBC 15.6 (*)    Hemoglobin 10.4 (*)    HCT 31.4 (*)    MCV 64.5 (*)    MCH 21.4 (*)    RDW 18.6 (*)    Platelets 484 (*)    All other components within normal limits  COMPREHENSIVE METABOLIC PANEL - Abnormal; Notable for the following:    Sodium 135 (*)    Glucose, Bld 254 (*)    Albumin 3.3 (*)    All other components within normal limits  LIPASE, BLOOD  MAGNESIUM  TROPONIN I   Imaging Review No results found.  EKG Interpretation    Date/Time:  Friday May 23 2013 22:27:12 EST Ventricular Rate:  125 PR Interval:  86 QRS Duration: 101 QT Interval:  330 QTC Calculation: 476 R Axis:   89 Text Interpretation:  Sinus tachycardia Ventricular premature complex Anterolateral infarct, old Baseline wander in lead(s) III aVF V1 V3 V4 V5 V6 No significant change since last tracing 19 May 2013 Confirmed by [facility]  MD-I, IVA ([id number]) on 05/23/2013 10:37:01 PM            MDM   1. Intractable vomiting   2. Diabetes mellitus type 2, uncontrolled, with complications   3. Chronic hepatitis B   4. LEUKOCYTOSIS, CHRONIC      Shirley [name] with a history of gastroparesis and chronic nausea and vomiting with unclear etiology presents to  the emergency department for evaluation again today. Multiple evaluations for the same without clear etiology. Patient had multiple CT scans (04/11/13 - normal) and  gastric emptying studies which have been normal. Pelvic ultrasound on 05/20/2013 was within normal  limits.  Will again check basic labs and give Haldol for vomiting as she reports that Reglan, Compazine, Phenergan and Zofran did not work.  12:05 AM Pt with cessation of her nausea and vomiting but remains tachycardic and hypertensive.  RN relates concerns of possible short runs of V. tach on the monitor however assessment of the strip reveals more likely artifact.  Will re\re dose bolus and continue to monitor.  Patient denies chest pain, dizziness or shortness of breath with these episodes. CBC with leukocytosis at baseline and increased glucose to 254. Lipase within normal limits. We'll add magnesium and troponin.  The patient was discussed with and seen by Dr. Rolland Porter who agrees with the treatment plan.  1:05 AM Patient with resolution of tachycardia after pain control.  Magnesium and troponin normal.  No further episodes of arrhythmia.  Patient resting at this time.  2:07 AM Patient remains without tachycardia. She reports she feels much better and is willing to begin a by mouth trial.  Patient's pain and other symptoms adequately managed in emergency department.  Fluid bolus given.  Labs and vitals reviewed.  On repeat exam patient does not have a surgical abdomin and there are no peritoneal signs.  No indication of appendicitis, bowel obstruction, bowel perforation, cholecystitis, diverticulitis.  Patient discharged home with symptomatic treatment and given strict instructions for follow-up with their primary care physician.  I have also discussed reasons to return immediately to the ER.  Patient expresses understanding and agrees with plan.  Case discussed with Antonietta Breach, PA-C who will follow discharge. Patient is to be discharged with Zofran.       Jarrett Soho Oliviarose Punch, PA-C 05/24/13 [phone]

## 2013-05-23 NOTE — ED Notes (Signed)
Patient is alert and oriented x3.  She is complaining of nausea and vomiting that she states she has been having  Over the last two days.  She was seen at [facility] cones for the same earlier this week.

## 2013-05-24 LAB — MAGNESIUM: MAGNESIUM: 1.7 mg/dL (ref 1.5–2.5)

## 2013-05-24 LAB — TROPONIN I: Troponin I: <0.3 ng/mL (ref <0.30)

## 2013-05-24 MED ORDER — GI COCKTAIL ~~LOC~~
30.0000 mL | Freq: Once | ORAL | Status: AC
  Administered 2013-05-24: 30 mL via ORAL
  Filled 2013-05-24: qty 30

## 2013-05-24 MED ORDER — ONDANSETRON HCL 4 MG/2ML IJ SOLN
4.0000 mg | INTRAMUSCULAR | Status: AC
  Administered 2013-05-24: 4 mg via INTRAVENOUS
  Filled 2013-05-24: qty 2

## 2013-05-24 MED ORDER — HYDROCODONE-ACETAMINOPHEN 5-325 MG PO TABS: 1.0000 | ORAL_TABLET | ORAL | Status: DC | PRN

## 2013-05-24 MED ORDER — HYDROMORPHONE HCL PF 1 MG/ML IJ SOLN
1.0000 mg | Freq: Once | INTRAMUSCULAR | Status: AC
  Administered 2013-05-24: 1 mg via INTRAVENOUS
  Filled 2013-05-24: qty 1

## 2013-05-24 MED ORDER — ONDANSETRON 8 MG PO TBDP: ORAL_TABLET | ORAL | Status: DC

## 2013-05-24 MED ORDER — HYDROMORPHONE HCL PF 1 MG/ML IJ SOLN
0.5000 mg | Freq: Once | INTRAMUSCULAR | Status: AC
  Administered 2013-05-24: 0.5 mg via INTRAVENOUS
  Filled 2013-05-24: qty 1

## 2013-05-24 MED ORDER — SODIUM CHLORIDE 0.9 % IV BOLUS (SEPSIS)
2000.0000 mL | Freq: Once | INTRAVENOUS | Status: AC
  Administered 2013-05-24: 2000 mL via INTRAVENOUS

## 2013-05-24 NOTE — ED Provider Notes (Signed)
Medical screening examination/treatment/procedure(s) were performed by non-physician practitioner and as supervising physician I was immediately available for consultation/collaboration.   Laketha Leopard, MD 05/24/13 0756 

## 2013-05-24 NOTE — ED Provider Notes (Signed)
PT has chronic LLQ pain and nausea with vomiting. She is followed by Dr Michail Sermon, PCP and Dr Benson Norway, GI.   Pt sleeping, when she wakes up she shakes her leg. She falls asleep quickly.  EKG/monitor has ? V tach more likely artifact.   Medical screening examination/treatment/procedure(s) were conducted as a shared visit with non-physician practitioner(s) and myself.  I personally evaluated the patient during the encounter.    EKG Interpretation    Date/Time:  Friday May 23 2013 22:27:12 EST Ventricular Rate:  125 PR Interval:  86 QRS Duration: 101 QT Interval:  330 QTC Calculation: 476 R Axis:   89 Text Interpretation:  Sinus tachycardia Ventricular premature complex Anterolateral infarct, old Baseline wander in lead(s) III aVF V1 V3 V4 V5 V6 No significant change since last tracing 19 May 2013 Confirmed by [facility]  MD-I, Camari Wisham ([id number]) on 05/23/2013 10:37:01 PM             Rolland Porter, MD, Alanson Aly, MD 05/24/13 [phone]

## 2013-05-24 NOTE — Discharge Instructions (Signed)
1. Medications: zofran, usual home medications 2. Treatment: rest, drink plenty of fluids,  3. Follow Up: Please followup with your primary doctor for discussion of your diagnoses and further evaluation after today's visit;   Gastroparesis  Gastroparesis is also called slowed stomach emptying (delayed gastric emptying). It is a condition in which the stomach takes too long to empty its contents. It often happens in people with diabetes.  CAUSES  Gastroparesis happens when nerves to the stomach are damaged or stop working. When the nerves are damaged, the muscles of the stomach and intestines do not work normally. The movement of food is slowed or stopped. High blood glucose (sugar) causes changes in nerves and can damage the blood vessels that carry oxygen and nutrients to the nerves. RISK FACTORS  Diabetes.  Post-viral syndromes.  Eating disorders (anorexia, bulimia).  Surgery on the stomach or vagus nerve.  Gastroesophageal reflux disease (rarely).  Smooth muscle disorders (amyloidosis, scleroderma).  Metabolic disorders, including hypothyroidism.  Parkinson disease. SYMPTOMS   Heartburn.  Feeling sick to your stomach (nausea).  Vomiting of undigested food.  An early feeling of fullness when eating.  Weight loss.  Abdominal bloating.  Erratic blood glucose levels.  Lack of appetite.  Gastroesophageal reflux.  Spasms of the stomach wall. Complications can include:  Bacterial overgrowth in stomach. Food stays in the stomach and can ferment and cause bacteria to grow.  Weight loss due to difficulty digesting and absorbing nutrients.  Vomiting.  Obstruction in the stomach. Undigested food can harden and cause nausea and vomiting.  Blood glucose fluctuations caused by inconsistent food absorption. DIAGNOSIS  The diagnosis of gastroparesis is confirmed through one or more of the following tests:  Barium X-rays and scans. These tests look at how long it takes  for food to move through the stomach.  Gastric manometry. This test measures electrical and muscular activity in the stomach. A thin tube is passed down the throat into the stomach. The tube contains a wire that takes measurements of the stomach's electrical and muscular activity as it digests liquids and solid food.  Endoscopy. This procedure is done with a long, thin tube called an endoscope. It is passed through the mouth and gently down the esophagus into the stomach. This tube helps the caregiver look at the lining of the stomach to check for any abnormalities.  Ultrasonography. This can rule out gallbladder disease or pancreatitis. This test will outline and define the shape of the gallbladder and pancreas. TREATMENT   Treatments may include:  Exercise.  Medicines to control nausea and vomiting.  Medicines to stimulate stomach muscles.  Changes in what and when you eat.  Having smaller meals more often.  Eating low-fiber forms of high-fiber foods, such as eating cooked vegetables instead of raw vegetables.  Eating low-fat foods.  Consuming liquids, which are easier to digest.  In severe cases, feeding tubes and intravenous (IV) feeding may be needed. It is important to note that in most cases, treatment does not cure gastroparesis. It is usually a lasting (chronic) condition. Treatment helps you manage the underlying condition so that you can be as healthy and comfortable as possible. Other treatments  A gastric neurostimulator has been developed to assist people with gastroparesis. The battery-operated device is surgically implanted. It emits mild electrical pulses to help improve stomach emptying and to control nausea and vomiting.  The use of botulinum toxin has been shown to improve stomach emptying by decreasing the prolonged contractions of the muscle between the stomach  and the small intestine (pyloric sphincter). The benefits are temporary. SEEK MEDICAL CARE IF:    You have diabetes and you are having problems keeping your blood glucose in goal range.  You are having nausea, vomiting, bloating, or early feelings of fullness with eating.  Your symptoms do not change with a change in diet. Document Released: 04/10/2005 Document Revised: 08/05/2012 Document Reviewed: 09/17/2008 [facility] Patient Information 2014 [place], [place].

## 2013-05-24 NOTE — ED Provider Notes (Signed)
Patient care assumed from [facility], PA-C at shift change.  0545 - Patient presents today for nausea and vomiting; patient has a significant history of this with last admission 2.5 weeks ago. Patient had fluid challenge at 0200 which she failed with another episode of nonbloody emesis. Patient was treated with Zofran and a second dose of Dilaudid for pain and allowed to rest for an additional 2 hours. Patient began sipping small amounts of ginger ale but thought this was to aggravating to her stomach. I provided patient ice chips which he has tolerated for the last 45 minutes. Patient has no tenderness to palpation of the abdomen on my examination. No peritoneal signs or guarding.  Patient states she wants to go home. She believes she can manage her symptoms further as an outpatient. Patient will be given ODT Zofran to take as needed for persistent nausea as well as short worse of Norco for pain control as needed. Return precautions provided and patient agreeable to plan with no unaddressed concerns.  Antonietta Breach, PA-C 05/24/13 [phone]

## 2013-05-24 NOTE — ED Provider Notes (Signed)
See prior note   Janice Norrie, MD 05/24/13 [phone]

## 2013-05-26 ENCOUNTER — Telehealth: Payer: Self-pay | Admitting: *Deleted

## 2013-05-26 NOTE — Telephone Encounter (Signed)
Scheduled for tomorrow at 3:15

## 2013-05-26 NOTE — Telephone Encounter (Signed)
Pt seen in ED on 1/15 for vomiting, she was told that she had trichomonas and treated her with Flagyl.  She took one does IV Flagyl  in ED she got a Rx for home but she only took one because it made her sick.  Hx: Gastroparesis, Nausea and Vomiting. Pt has been back to ED 4 times for N/V.  Last note from ED doctor advised pt to f/u with PCP.   I will schedule OV as soon as possible.  Please advise on the Flagyl. Pt # [id number]

## 2013-05-26 NOTE — Telephone Encounter (Signed)
I would have her come in to be seen. I would not continue Flagyl if she is not tolerating it.

## 2013-05-27 ENCOUNTER — Ambulatory Visit: Payer: Medicaid Other | Admitting: Internal Medicine

## 2013-06-06 IMAGING — CR DG ABDOMEN ACUTE W/ 1V CHEST
4 series · 4 of 4 positions shown · non-contrast
Comparison: 10/05/2010

CLINICAL DATA: Abdominal pain, nausea, vomiting, and diarrhea

ACUTE ABDOMEN SERIES (ABDOMEN 2 VIEW & CHEST 1 VIEW)

[w chest pa]
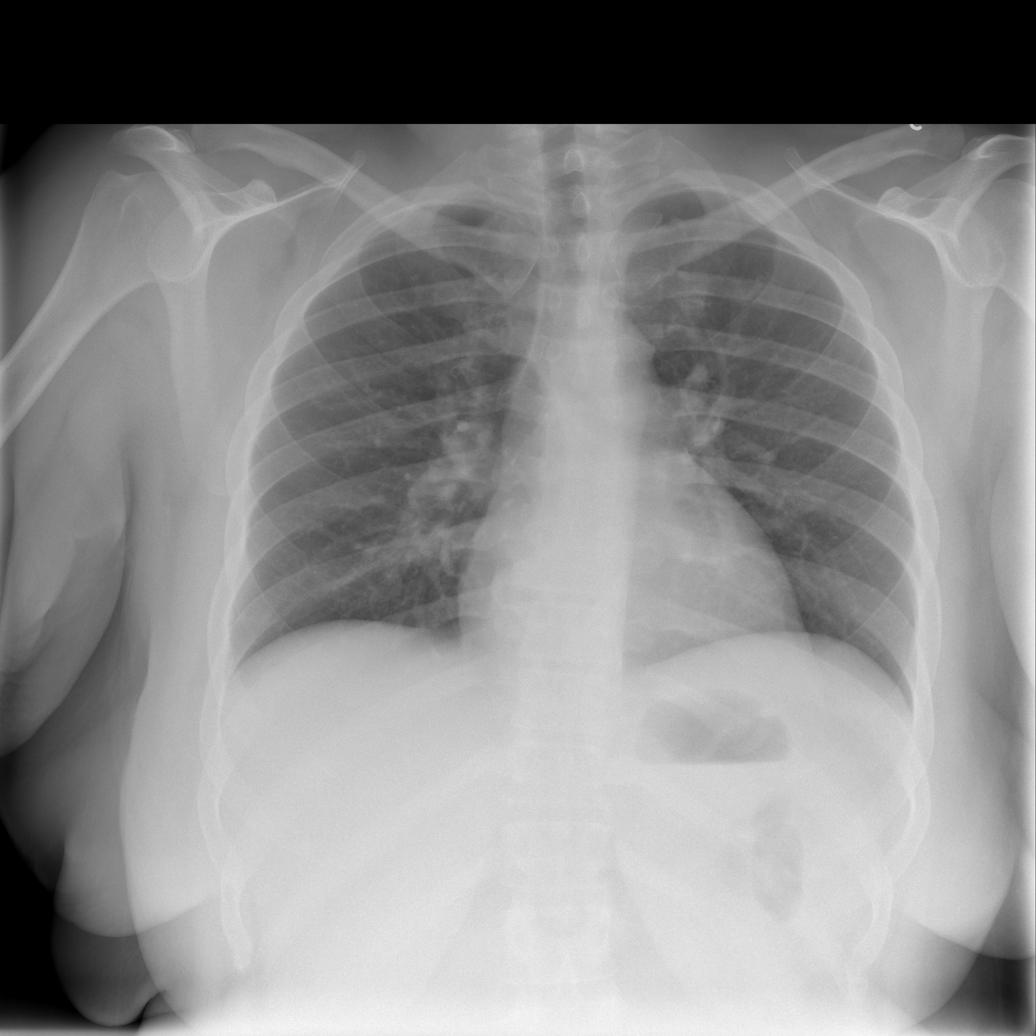

[w abdomen upright *]
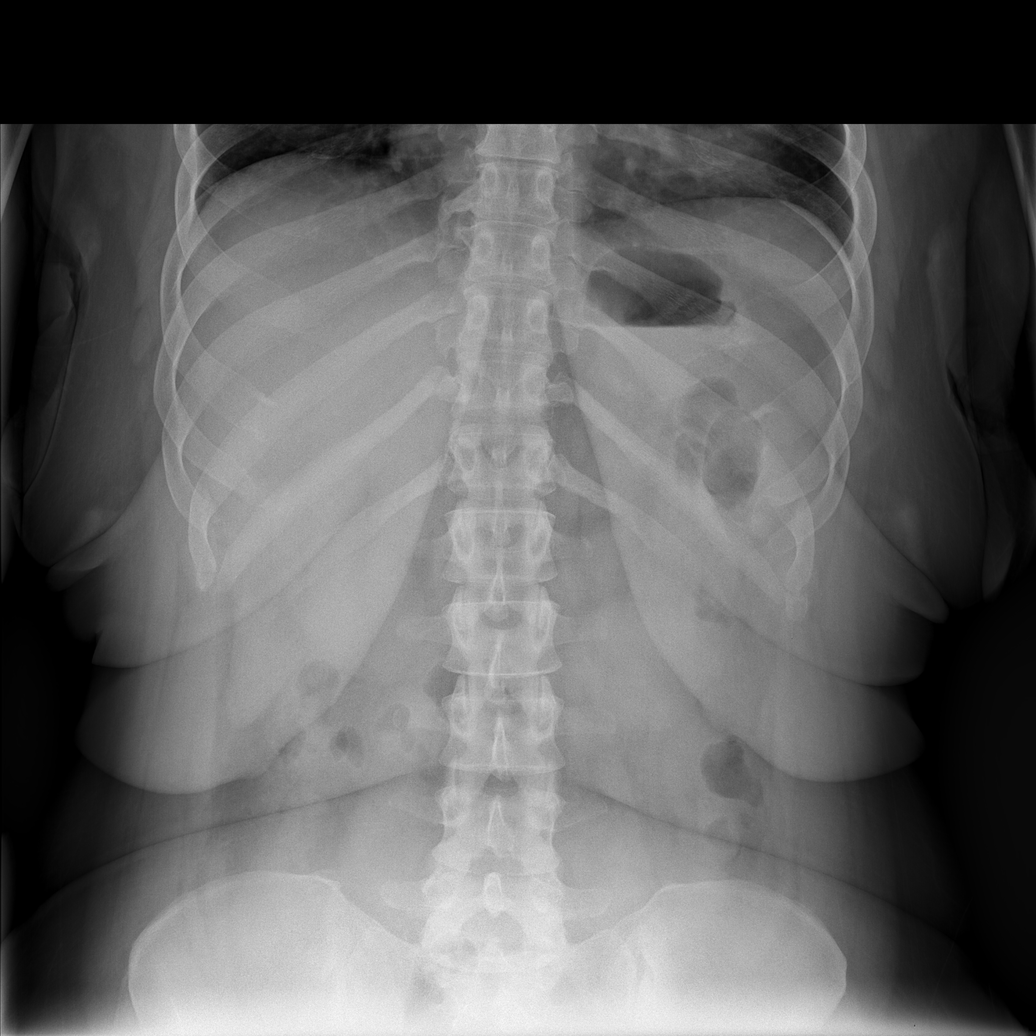

[t abdomen supine (1 of 2)]
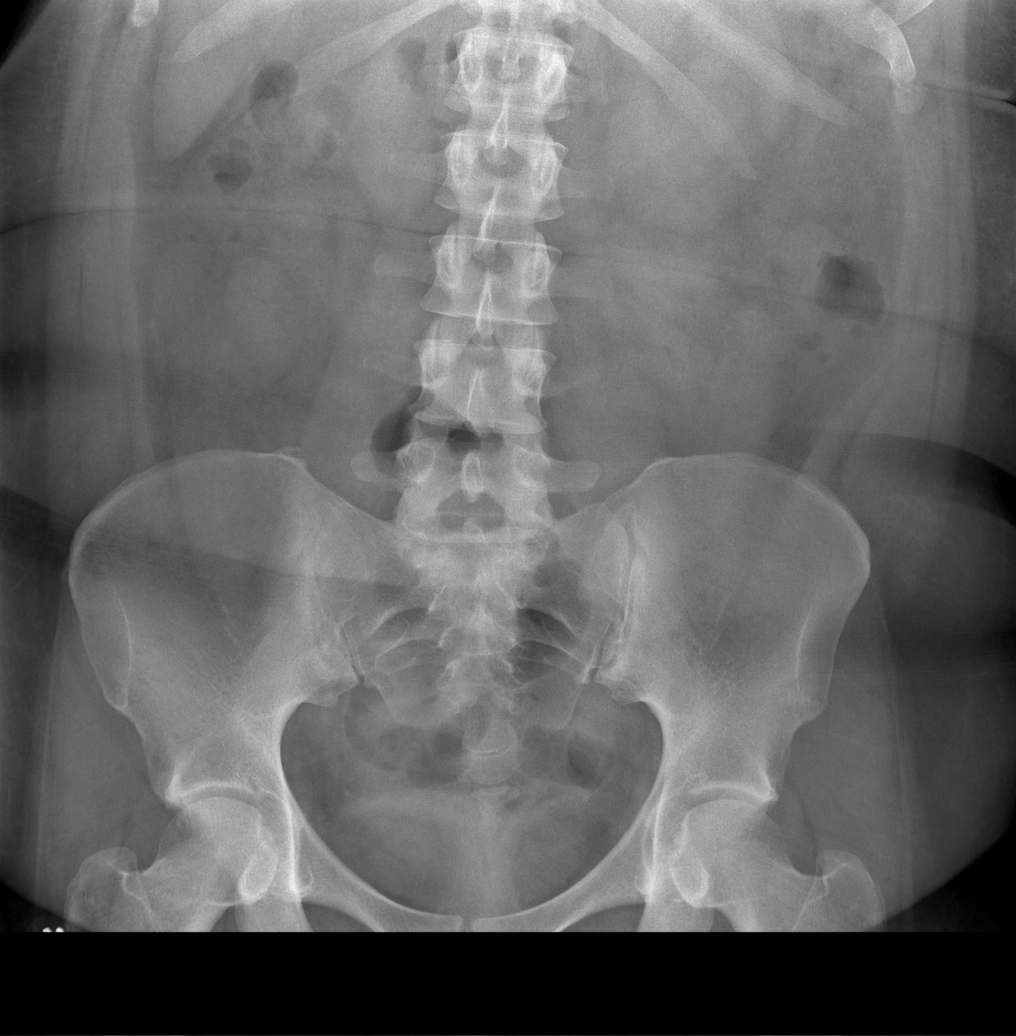

[t abdomen supine (2 of 2)]
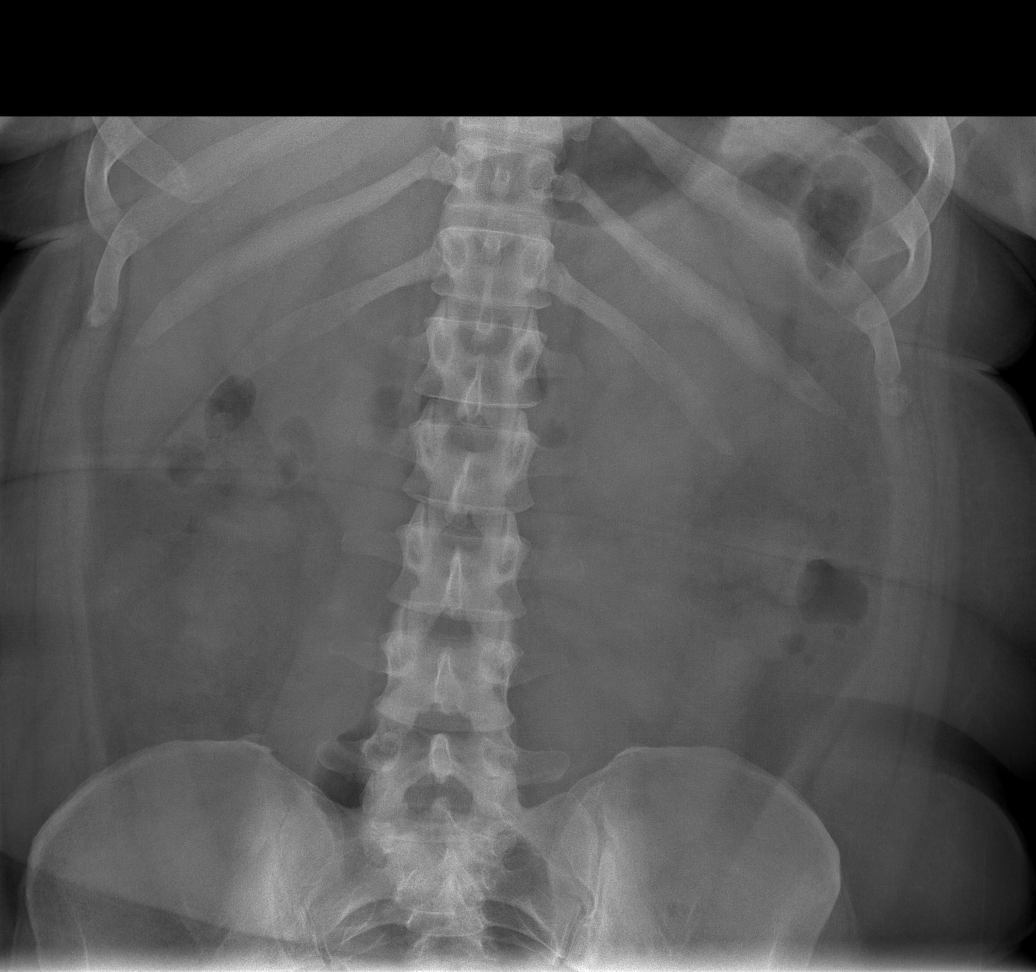

[4 of 4 positions shown; findings below may reference images not displayed]

FINDINGS: Slightly shallow inspiration. Normal heart size and
pulmonary vascularity.  No focal consolidation in the lungs.  No
blunting of costophrenic angles.

Scattered gas and stool in the colon.  No small or large bowel
dilatation.  No free intra-abdominal air.  No abnormal air fluid
levels.  No radiopaque stones.  No significant changes since the
previous study.
IMPRESSION: No evidence of active pulmonary disease.  Nonobstructive bowel gas
pattern.

## 2013-06-18 ENCOUNTER — Encounter: Payer: Medicaid Other | Admitting: Obstetrics & Gynecology

## 2013-06-23 NOTE — Addendum Note (Signed)
Addended by: Hulan Fray on: 06/23/2013 06:33 PM   Modules accepted: Orders

## 2013-06-25 ENCOUNTER — Encounter (HOSPITAL_COMMUNITY): Payer: Self-pay | Admitting: Emergency Medicine

## 2013-06-25 ENCOUNTER — Emergency Department (HOSPITAL_COMMUNITY)
Admission: EM | Admit: 2013-06-25 | Discharge: 2013-06-25 | Disposition: A | Discharge status: Home / Self Care | Payer: Medicaid Other | Attending: Emergency Medicine | Admitting: Emergency Medicine

## 2013-06-25 DIAGNOSIS — I252 Old myocardial infarction: Secondary | ICD-10-CM | POA: Insufficient documentation

## 2013-06-25 DIAGNOSIS — Z8669 Personal history of other diseases of the nervous system and sense organs: Secondary | ICD-10-CM | POA: Insufficient documentation

## 2013-06-25 DIAGNOSIS — Z87448 Personal history of other diseases of urinary system: Secondary | ICD-10-CM | POA: Insufficient documentation

## 2013-06-25 DIAGNOSIS — I2510 Atherosclerotic heart disease of native coronary artery without angina pectoris: Secondary | ICD-10-CM | POA: Insufficient documentation

## 2013-06-25 DIAGNOSIS — F3289 Other specified depressive episodes: Secondary | ICD-10-CM | POA: Insufficient documentation

## 2013-06-25 DIAGNOSIS — R1110 Vomiting, unspecified: Secondary | ICD-10-CM

## 2013-06-25 DIAGNOSIS — Z87891 Personal history of nicotine dependence: Secondary | ICD-10-CM | POA: Insufficient documentation

## 2013-06-25 DIAGNOSIS — Z9889 Other specified postprocedural states: Secondary | ICD-10-CM | POA: Insufficient documentation

## 2013-06-25 DIAGNOSIS — Z79899 Other long term (current) drug therapy: Secondary | ICD-10-CM | POA: Insufficient documentation

## 2013-06-25 DIAGNOSIS — IMO0002 Reserved for concepts with insufficient information to code with codable children: Secondary | ICD-10-CM | POA: Insufficient documentation

## 2013-06-25 DIAGNOSIS — G43909 Migraine, unspecified, not intractable, without status migrainosus: Secondary | ICD-10-CM | POA: Insufficient documentation

## 2013-06-25 DIAGNOSIS — Z794 Long term (current) use of insulin: Secondary | ICD-10-CM | POA: Insufficient documentation

## 2013-06-25 DIAGNOSIS — Z8742 Personal history of other diseases of the female genital tract: Secondary | ICD-10-CM | POA: Insufficient documentation

## 2013-06-25 DIAGNOSIS — Z872 Personal history of diseases of the skin and subcutaneous tissue: Secondary | ICD-10-CM | POA: Insufficient documentation

## 2013-06-25 DIAGNOSIS — E669 Obesity, unspecified: Secondary | ICD-10-CM | POA: Insufficient documentation

## 2013-06-25 DIAGNOSIS — Z8619 Personal history of other infectious and parasitic diseases: Secondary | ICD-10-CM | POA: Insufficient documentation

## 2013-06-25 DIAGNOSIS — I10 Essential (primary) hypertension: Secondary | ICD-10-CM | POA: Insufficient documentation

## 2013-06-25 DIAGNOSIS — N390 Urinary tract infection, site not specified: Secondary | ICD-10-CM | POA: Insufficient documentation

## 2013-06-25 DIAGNOSIS — F329 Major depressive disorder, single episode, unspecified: Secondary | ICD-10-CM | POA: Insufficient documentation

## 2013-06-25 DIAGNOSIS — Z862 Personal history of diseases of the blood and blood-forming organs and certain disorders involving the immune mechanism: Secondary | ICD-10-CM | POA: Insufficient documentation

## 2013-06-25 DIAGNOSIS — J45909 Unspecified asthma, uncomplicated: Secondary | ICD-10-CM | POA: Insufficient documentation

## 2013-06-25 DIAGNOSIS — Z8673 Personal history of transient ischemic attack (TIA), and cerebral infarction without residual deficits: Secondary | ICD-10-CM | POA: Insufficient documentation

## 2013-06-25 DIAGNOSIS — F411 Generalized anxiety disorder: Secondary | ICD-10-CM | POA: Insufficient documentation

## 2013-06-25 DIAGNOSIS — Z3202 Encounter for pregnancy test, result negative: Secondary | ICD-10-CM | POA: Insufficient documentation

## 2013-06-25 DIAGNOSIS — K219 Gastro-esophageal reflux disease without esophagitis: Secondary | ICD-10-CM | POA: Insufficient documentation

## 2013-06-25 LAB — URINE MICROSCOPIC-ADD ON

## 2013-06-25 LAB — CBC WITH DIFFERENTIAL/PLATELET
BASOS ABS: 0 10*3/uL (ref 0.0–0.1)
Basophils Relative: 0 % (ref 0–1)
Eosinophils Absolute: 0.6 10*3/uL (ref 0.0–0.7)
Eosinophils Relative: 5 % (ref 0–5)
HEMATOCRIT: 32.6 % — AB (ref 36.0–46.0)
Hemoglobin: 10.8 g/dL — ABNORMAL LOW (ref 12.0–15.0)
LYMPHS ABS: 3.5 10*3/uL (ref 0.7–4.0)
Lymphocytes Relative: 26 % (ref 12–46)
MCH: 21.2 pg — ABNORMAL LOW (ref 26.0–34.0)
MCHC: 33.1 g/dL (ref 30.0–36.0)
MCV: 63.9 fL — ABNORMAL LOW (ref 78.0–100.0)
Monocytes Absolute: 1 10*3/uL (ref 0.1–1.0)
Monocytes Relative: 7 % (ref 3–12)
NEUTROS ABS: 8.4 10*3/uL — AB (ref 1.7–7.7)
Neutrophils Relative %: 62 % (ref 43–77)
PLATELETS: 653 10*3/uL — AB (ref 150–400)
RBC: 5.1 MIL/uL (ref 3.87–5.11)
RDW: 18.2 % — AB (ref 11.5–15.5)
WBC: 13.5 10*3/uL — AB (ref 4.0–10.5)

## 2013-06-25 LAB — URINALYSIS, ROUTINE W REFLEX MICROSCOPIC
GLUCOSE, UA: NEGATIVE mg/dL
KETONES UR: 40 mg/dL — AB
Nitrite: POSITIVE — AB
Protein, ur: 100 mg/dL — AB
Specific Gravity, Urine: 1.007 (ref 1.005–1.030)
Urobilinogen, UA: 1 mg/dL (ref 0.0–1.0)
pH: 7 (ref 5.0–8.0)

## 2013-06-25 LAB — COMPREHENSIVE METABOLIC PANEL
AST: 14 U/L (ref 0–37)
Albumin: 3.8 g/dL (ref 3.5–5.2)
Alkaline Phosphatase: 68 U/L (ref 39–117)
BILIRUBIN TOTAL: 0.3 mg/dL (ref 0.3–1.2)
BUN: 7 mg/dL (ref 6–23)
CO2: 20 meq/L (ref 19–32)
Calcium: 9.9 mg/dL (ref 8.4–10.5)
Chloride: 98 mEq/L (ref 96–112)
Creatinine, Ser: 0.74 mg/dL (ref 0.50–1.10)
GLUCOSE: 179 mg/dL — AB (ref 70–99)
Potassium: 3.8 mEq/L (ref 3.7–5.3)
Sodium: 136 mEq/L — ABNORMAL LOW (ref 137–147)
Total Protein: 8.9 g/dL — ABNORMAL HIGH (ref 6.0–8.3)

## 2013-06-25 LAB — PREGNANCY, URINE: Preg Test, Ur: NEGATIVE

## 2013-06-25 LAB — LIPASE, BLOOD: Lipase: 24 U/L (ref 11–59)

## 2013-06-25 MED ORDER — PROCHLORPERAZINE EDISYLATE 5 MG/ML IJ SOLN: 10.0000 mg | Freq: Once | INTRAMUSCULAR | Status: DC

## 2013-06-25 MED ORDER — CEFTRIAXONE SODIUM 1 G IJ SOLR
1.0000 g | Freq: Once | INTRAMUSCULAR | Status: AC
  Administered 2013-06-25: 1 g via INTRAVENOUS
  Filled 2013-06-25: qty 10

## 2013-06-25 MED ORDER — LORAZEPAM 2 MG/ML IJ SOLN
1.0000 mg | Freq: Once | INTRAMUSCULAR | Status: AC
Start: 2013-06-25 — End: 2013-06-25
  Administered 2013-06-25: 1 mg via INTRAVENOUS
  Filled 2013-06-25: qty 1

## 2013-06-25 MED ORDER — SODIUM CHLORIDE 0.9 % IV BOLUS (SEPSIS)
1000.0000 mL | Freq: Once | INTRAVENOUS | Status: AC
  Administered 2013-06-25: 1000 mL via INTRAVENOUS

## 2013-06-25 MED ORDER — CEPHALEXIN 500 MG PO CAPS: 500.0000 mg | ORAL_CAPSULE | Freq: Four times a day (QID) | ORAL | Status: DC

## 2013-06-25 MED ORDER — HYDROMORPHONE HCL PF 1 MG/ML IJ SOLN
1.0000 mg | Freq: Once | INTRAMUSCULAR | Status: AC
  Administered 2013-06-25: 1 mg via INTRAVENOUS
  Filled 2013-06-25: qty 1

## 2013-06-25 MED ORDER — HYDROCODONE-ACETAMINOPHEN 5-325 MG PO TABS: 1.0000 | ORAL_TABLET | Freq: Four times a day (QID) | ORAL | Status: DC | PRN

## 2013-06-25 MED ORDER — ONDANSETRON HCL 4 MG/2ML IJ SOLN
4.0000 mg | Freq: Once | INTRAMUSCULAR | Status: AC
  Administered 2013-06-25: 4 mg via INTRAVENOUS
  Filled 2013-06-25: qty 2

## 2013-06-25 NOTE — Discharge Instructions (Signed)
Cyclic Vomiting Syndrome °Cyclic vomiting syndrome is a benign condition in which patients experience bouts or cycles of severe nausea and vomiting that last for hours or even days. The bouts of nausea and vomiting alternate with longer periods of no symptoms and generally good health. Cyclic vomiting syndrome occurs mostly in children, but can affect adults. °CAUSES  °CVS has no known cause. Each episode is typically similar to the previous ones. The episodes tend to:  °· Start at about the same time of day. °· Last the same length of time. °· Present the same symptoms at the same level of intensity. °Cyclic vomiting syndrome can begin at any age in children and adults. Cyclic vomiting syndrome usually starts between the ages of 3 and 7 years. In adults, episodes tend to occur less often than they do in children, but they last longer. Furthermore, the events or situations that trigger episodes in adults cannot always be pinpointed as easily as they can in children. °There are 4 phases of cyclic vomiting syndrome: °1. Prodrome. The prodrome phase signals that an episode of nausea and vomiting is about to begin. This phase can last from just a few minutes to several hours. This phase is often marked by belly (abdominal) pain. Sometimes taking medicine early in the prodrome phase can stop an episode in progress. However, sometimes there is no warning. A person may simply wake up in the middle of the night or early morning and begin vomiting. °2. Episode. The episode phase consists of: °· Severe vomiting. °· Nausea. °· Gagging (retching). °3. Recovery. The recovery phase begins when the nausea and vomiting stop. Healthy color, appetite, and energy return. °4. Symptom-free interval. The symptom-free interval phase is the period between episodes when no symptoms are present. °TRIGGERS °Episodes can be triggered by an infection or event. Examples of triggers include: °· Infections. °· Colds, allergies, sinus problems, and  the flu. °· Eating certain foods such as chocolate or cheese. °· Foods with monosodium glutamate (MSG) or preservatives. °· Fast foods. °· Pre-packaged foods. °· Foods with low nutritional value (junk foods). °· Overeating. °· Eating just before going to bed. °· Hot weather. °· Dehydration. °· Not enough sleep or poor sleep quality. °· Physical exhaustion. °· Menstruation. °· Motion sickness. °· Emotional stress (school or home difficulties). °· Excitement or stress. °SYMPTOMS  °The main symptoms of cyclic vomiting syndrome are: °· Severe vomiting. °· Nausea. °· Gagging (retching). °Episodes usually begin at night or the first thing in the morning. Episodes may include vomiting or retching up to 5 or 6 times an hour during the worst of the episode. Episodes usually last anywhere from 1 to 4 days. Episodes can last for up to 10 days. Other symptoms include: °· Paleness. °· Exhaustion. °· Listlessness. °· Abdominal pain. °· Loose stools or diarrhea. °Sometimes the nausea and vomiting are so severe that a person appears to be almost unconscious. Sensitivity to light, headache, fever, dizziness, may also accompany an episode. In addition, the vomiting may cause drooling and excessive thirst. Drinking water usually leads to more vomiting, though the water can dilute the acid in the vomit, making the episode a Shark less painful. Continuous vomiting can lead to dehydration, which means that the body has lost excessive water and salts. °DIAGNOSIS  °Cyclic vomiting syndrome is hard to diagnose because there are no clear tests to identify it. A caregiver must diagnose cyclic vomiting syndrome by looking at symptoms and medical history. A caregiver must exclude more common diseases   or disorders that can also cause nausea and vomiting. Also, diagnosis takes time because caregivers need to identify a pattern or cycle to the vomiting. TREATMENT  Cyclic vomiting syndrome cannot be cured. Treatment varies, but people with  cyclic vomiting syndrome should get plenty of rest and sleep and take medications that prevent, stop, or lessen the vomiting episodes and other symptoms. People whose episodes are frequent and long-lasting may be treated during the symptom-free intervals in an effort to prevent or ease future episodes. The symptom-free phase is a good time to eliminate anything known to trigger an episode. For example, if episodes are brought on by stress or excitement, this period is the time to find ways to reduce stress and stay calm. If sinus problems or allergies cause episodes, those conditions should be treated. The triggers listed above should be avoided or prevented. Because of the similarities between migraine and cyclic vomiting syndrome, caregivers treat some people with severe cyclic vomiting syndrome with drugs that are also used for migraine headaches. The drugs are designed to:  Prevent episodes.  Reduce their frequency.  Lessen their severity. HOME CARE INSTRUCTIONS Once a vomiting episode begins, treatment is supportive. It helps to stay in bed and sleep in a dark, quiet room. Severe nausea and vomiting may require hospitalization and intravenous (IV) fluids to prevent dehydration. Relaxing medications (sedatives) may help if the nausea continues. Sometimes, during the prodrome phase, it is possible to stop an episode from happening altogether. Only take over-the-counter or prescription medicines for pain, discomfort or fever as directed by your caregiver. Do not give aspirin to children. During the recovery phase, drinking water and replacing lost electrolytes (salts in the blood) are very important. Electrolytes are salts that the body needs to function well and stay healthy. Symptoms during the recovery phase can vary. Some people find that their appetites return to normal immediately, while others need to begin by drinking clear liquids and then move slowly to solid food. RELATED COMPLICATIONS The  severe vomiting that defines cyclic vomiting syndrome is a risk factor for several complications:  Dehydration Vomiting causes the body to lose water quickly.  Electrolyte imbalance Vomiting also causes the body to lose the important salts it needs to keep working properly.  Peptic esophagitis The tube that connects the mouth to the stomach (esophagus) becomes injured from the stomach acid that comes up with the vomit.  Hematemesis The esophagus becomes irritated and bleeds, so blood mixes with the vomit.  Mallory-Weiss tear The lower end of the esophagus may tear open or the stomach may bruise from vomiting or retching.  Tooth decay The acid in the vomit can hurt the teeth by corroding the tooth enamel. SEEK MEDICAL CARE IF: You have questions or problems. Document Released: 06/19/2001 Document Revised: 07/03/2011 Document Reviewed: 07/18/2010 [facility] Patient Information 2014 [place], [place]. Urinary Tract Infection Urinary tract infections (UTIs) can develop anywhere along your urinary tract. Your urinary tract is your body's drainage system for removing wastes and extra water. Your urinary tract includes two kidneys, two ureters, a bladder, and a urethra. Your kidneys are a pair of bean-shaped organs. Each kidney is about the size of your fist. They are located below your ribs, one on each side of your spine. CAUSES Infections are caused by microbes, which are microscopic organisms, including fungi, viruses, and bacteria. These organisms are so small that they can only be seen through a microscope. Bacteria are the microbes that most commonly cause UTIs. SYMPTOMS  Symptoms of UTIs  may vary by age and gender of the patient and by the location of the infection. Symptoms in young women typically include a frequent and intense urge to urinate and a painful, burning feeling in the bladder or urethra during urination. Older women and men are more likely to be tired, shaky, and weak and have  muscle aches and abdominal pain. A fever may mean the infection is in your kidneys. Other symptoms of a kidney infection include pain in your back or sides below the ribs, nausea, and vomiting. DIAGNOSIS To diagnose a UTI, your caregiver will ask you about your symptoms. Your caregiver also will ask to provide a urine sample. The urine sample will be tested for bacteria and white blood cells. White blood cells are made by your body to help fight infection. TREATMENT  Typically, UTIs can be treated with medication. Because most UTIs are caused by a bacterial infection, they usually can be treated with the use of antibiotics. The choice of antibiotic and length of treatment depend on your symptoms and the type of bacteria causing your infection. HOME CARE INSTRUCTIONS  If you were prescribed antibiotics, take them exactly as your caregiver instructs you. Finish the medication even if you feel better after you have only taken some of the medication.  Drink enough water and fluids to keep your urine clear or pale yellow.  Avoid caffeine, tea, and carbonated beverages. They tend to irritate your bladder.  Empty your bladder often. Avoid holding urine for long periods of time.  Empty your bladder before and after sexual intercourse.  After a bowel movement, women should cleanse from front to back. Use each tissue only once. SEEK MEDICAL CARE IF:   You have back pain.  You develop a fever.  Your symptoms do not begin to resolve within 3 days. SEEK IMMEDIATE MEDICAL CARE IF:   You have severe back pain or lower abdominal pain.  You develop chills.  You have nausea or vomiting.  You have continued burning or discomfort with urination. MAKE SURE YOU:   Understand these instructions.  Will watch your condition.  Will get help right away if you are not doing well or get worse. Document Released: 01/18/2005 Document Revised: 10/10/2011 Document Reviewed: 05/19/2011 [facility] Patient  Information 2014 [place].

## 2013-06-25 NOTE — ED Provider Notes (Signed)
Medical screening examination/treatment/procedure(s) were performed by non-physician practitioner and as supervising physician I was immediately available for consultation/collaboration.  Neta Ehlers, MD 06/25/13 2004

## 2013-06-25 NOTE — ED Notes (Signed)
Pt reports LLQ abdominal pain that has been going on intermittently for the past few years, states the pain returned on Sunday with n/v x3 episodes in the past 4 hours, LBM 3/3 denies diarrhea. Pt a&o x4, tearful in triage.

## 2013-06-25 NOTE — ED Provider Notes (Signed)
Medical screening examination/treatment/procedure(s) were performed by non-physician practitioner and as supervising physician I was immediately available for consultation/collaboration.   EKG Interpretation   Date/Time:  Wednesday June 25 2013 01:34:57 EST Ventricular Rate:  106 PR Interval:  111 QRS Duration: 69 QT Interval:  323 QTC Calculation: 429 R Axis:   50 Text Interpretation:  Sinus tachycardia Atrial premature complex  Borderline repolarization abnormality No significant change since last  tracing Confirmed by Blanchard  MD, [place] ([id number]) on 06/25/2013 1:41:20 AM        Neta Ehlers, MD 06/25/13 2004

## 2013-06-25 NOTE — ED Provider Notes (Signed)
CSN: [id number]     Arrival date & time 06/25/13  0117 History   First MD Initiated Contact with Patient 06/25/13 0232     Chief Complaint  Patient presents with  . Abdominal Pain     (Consider location/radiation/quality/duration/timing/severity/associated sxs/prior Treatment) HPI History provided by pt and prior chart.  Pt presents w/ diffuse lower abdominal pain and N/V for the past 3-4 days.  No relief w/ zofran or phenergan suppositories and unable to tolerate food/fluids.  Associated w/ dysuria.  Denies fever, hematemesis/hematochezia/melena, other GU sx.  Has had these sx several times in the past.  Per prior chart, pt is seen for same in ED frequently.  Had a normal gastric emptying study in 2011 and has had unremarkable CT abd/pelvis in 03/2013 and transvaginal US in 04/2013.   Past Medical History  Diagnosis Date  . Thrombocytosis     Hem/Onc suggested 2/2 chronic hepatits and/or iron deficiency anemia  . Iron deficiency anemia   . N&V (nausea and vomiting)     Chronic. Unclear etiology with multiple admission and ED visits. CT abdomen with and without contrast (02/2011)  showed no acute process. Gastic Emptying scan (01/2010) was normal. Ultrasound of the abdomen was within normal limits. Hepatitis B viral load was undectable. HIV NR. EGD - gastritis, Hpylori + s/p Rx  . Abdominal pain   . Hypertension   . Hyperlipidemia   . Diabetes mellitus type 2, uncontrolled, with complications   . Recurrent boils   . Back pain   . OSA (obstructive sleep apnea)   . 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 Cath 12/10- no restenosis or significant CAD progression  . Irregular menses     Small ovarian follicles seen on [facility](9/81)  . History of pyelonephritis     H/o GrpB Pyelonephritis (9/06) and UTI- 07/11- E.Coli, 12/10- GBS  . Abscess of tunica vaginalis     10/09- Abundant S.  aureus- sensitive to all abx  . Obesity   . Asthma   . Gastritis   . Depression   . Hepatitis B, chronic     Hep BeAb+,Hep B cAb+ & Hep BsAg+ (9/06)  . Peripheral neuropathy   . Fibromyalgia   . GERD (gastroesophageal reflux disease)   . Migraine   . Anxiety   . Gastroparesis     secondary to poorly controlled DM, last emptying study performed 01/2010  was normal but may be falsely positive as pt was on reglan  . Blood dyscrasia   . H/O: CVA (cerebrovascular accident)     history of remote right cerebellar infarct noted on head CT at least since 10/2011   Past Surgical History  Procedure Laterality Date  . Cesarean section  1997   Family History  Problem Relation Age of Onset  . Diabetes Father    History  Substance Use Topics  . Smoking status: Former Smoker    Types: Cigarettes    Quit date: 04/24/1996  . Smokeless tobacco: Never Used     Comment: quit smoking cigarettes age 19  . Alcohol Use: Yes     Comment: occasionally   OB History   Grav Para Term Preterm Abortions TAB SAB Ect Mult Living                 Review of Systems  All other systems reviewed and are negative.  Allergies  Lisinopril and Morphine and related  Home Medications   Current Outpatient Rx  Name  Route  Sig  Dispense  Refill  . albuterol (PROVENTIL HFA;VENTOLIN HFA) 108 (90 BASE) MCG/ACT inhaler   Inhalation   Inhale 2 puffs into the lungs every 4 (four) hours as needed for wheezing or shortness of breath.         Marland Kitchen amitriptyline (ELAVIL) 25 MG tablet   Oral   Take 2 tablets (50 mg total) by mouth at bedtime.   60 tablet   3   . amLODipine (NORVASC) 10 MG tablet   Oral   Take 1 tablet (10 mg total) by mouth daily.   30 tablet   6   . calcium carbonate (TUMS - DOSED IN MG ELEMENTAL CALCIUM) 500 MG chewable tablet   Oral   Chew 2 tablets by mouth daily.          . Fluticasone-Salmeterol (ADVAIR) 100-50 MCG/DOSE AEPB   Inhalation   Inhale 1 puff into the lungs  every 12 (twelve) hours.   60 each   6   . gabapentin (NEURONTIN) 300 MG capsule   Oral   Take 300 mg by mouth 3 (three) times daily.         Marland Kitchen HYDROcodone-acetaminophen (NORCO/VICODIN) 5-325 MG per tablet   Oral   Take 1-2 tablets by mouth every 4 (four) hours as needed for moderate pain.   7 tablet   0   . insulin aspart (NOVOLOG) 100 UNIT/ML injection   Subcutaneous   Inject 2-12 Units into the skin 3 (three) times daily as needed for high blood sugar. Sliding scale- If sugar 150-199: inject 2 unit;  200-249: 4 units, 250-299: 7 units; 300-349: 10 units; Over 350: 12 units         . insulin glargine (LANTUS) 100 UNIT/ML injection   Subcutaneous   Inject 50 Units into the skin at bedtime.         Marland Kitchen LORazepam (ATIVAN) 1 MG tablet   Oral   Take 1 tablet (1 mg total) by mouth 3 (three) times daily as needed for anxiety.   15 tablet   0   . omeprazole (PRILOSEC) 40 MG capsule   Oral   Take 1 capsule (40 mg total) by mouth daily.   31 capsule   6   . ondansetron (ZOFRAN-ODT) 4 MG disintegrating tablet   Oral   Take 4 mg by mouth every 4 (four) hours as needed for nausea or vomiting.         . pregabalin (LYRICA) 75 MG capsule   Oral   Take 1 capsule (75 mg total) by mouth 2 (two) times daily.   60 capsule   6    BP 152/96  Pulse 105  Temp(Src) 98.3 F (36.8 C) (Oral)  Resp 18  SpO2 97%  LMP 06/22/2013 Physical Exam  Nursing note and vitals reviewed. Constitutional: She is oriented to person, place, and time. She appears well-developed and well-nourished.  Patient laying on her abdomen with feet dangling off the bed and she is kicking them and moaning in pain.  She is actively vomiting in ED.  Morbidly obese.   HENT:  Head: Normocephalic and atraumatic.  Eyes:  Normal appearance  Neck: Normal range of motion.  Cardiovascular:  tachycardic  Pulmonary/Chest: Effort normal and breath sounds normal. No respiratory distress.  Musculoskeletal: Normal  range of motion.  Neurological: She is alert and oriented to person, place, and time.  Skin: Skin is warm and dry. No rash noted.  Psychiatric: Her behavior is normal.  tearful    ED Course  Procedures (including critical care time) Labs Review Labs Reviewed  CBC WITH DIFFERENTIAL - Abnormal; Notable for the following:    WBC 13.5 (*)    Hemoglobin 10.8 (*)    HCT 32.6 (*)    MCV 63.9 (*)    MCH 21.2 (*)    RDW 18.2 (*)    Platelets 653 (*)    Neutro Abs 8.4 (*)    All other components within normal limits  COMPREHENSIVE METABOLIC PANEL - Abnormal; Notable for the following:    Sodium 136 (*)    Glucose, Bld 179 (*)    Total Protein 8.9 (*)    All other components within normal limits  URINALYSIS, ROUTINE W REFLEX MICROSCOPIC - Abnormal; Notable for the following:    Color, Urine RED (*)    APPearance TURBID (*)    Hgb urine dipstick LARGE (*)    Bilirubin Urine LARGE (*)    Ketones, ur 40 (*)    Protein, ur 100 (*)    Nitrite POSITIVE (*)    Leukocytes, UA MODERATE (*)    All other components within normal limits  URINE MICROSCOPIC-ADD ON - Abnormal; Notable for the following:    Bacteria, UA FEW (*)    All other components within normal limits  URINE CULTURE  LIPASE, BLOOD  PREGNANCY, URINE   Imaging Review No results found.   EKG Interpretation   Date/Time:  Wednesday June 25 2013 01:34:57 EST Ventricular Rate:  106 PR Interval:  111 QRS Duration: 69 QT Interval:  323 QTC Calculation: 429 R Axis:   50 Text Interpretation:  Sinus tachycardia Atrial premature complex  Borderline repolarization abnormality No significant change since last  tracing Confirmed by [place]  MD, [place] ([id number]) on 06/25/2013 1:41:20 AM      MDM   Final diagnoses:  None   37yo F presents w/ N/V and lower abd pain.  Seen in ED for frequently and has had several admissions.  Etiology unclear.  Has had gastric emptying studies and recent CT abd/pelvis and transvaginal US.   Today's symptoms are reportedly typical; onset 3-4 days ago.  On exam, afebrile, tachycardic, laying on bed face down w/ legs dangling off bed, kicking, moaning in pain.  Labs sig for possible UTI.  Pt has received compazine and dilaudid.  Her sx have been difficult to control in ED in past.  Marlon Pel, PA-C to resume care.   Irena, PA-C 06/25/13 0800

## 2013-06-25 NOTE — ED Notes (Signed)
Patient unable to void at this time

## 2013-06-25 NOTE — ED Provider Notes (Signed)
6:10 AM Patient signed out to me by Schinlever, PA-C.  Patient has history of abdominal pain and vomiting.  She has had multiple tests and studies, with no source for her symptoms.  She states that this feels the same as always.   PE: Gen: A&O x4 HEENT: PERRL, EOM intact CHEST: RRR, no m/r/g LUNGS: CTAB, no w/r/r ABD: BS x 4, ND/NT EXT: No edema, strong peripheral pulses NEURO: Sensation and strength intact bilaterally  6:59 AM Patient reassessed. No focal abdominal tenderness. Patient states that her symptoms are the same as always. However, she does state that she does have some flank pain and dysuria for the past week. Will treat her for her UTI. Will give her an additional dose of pain medicine and Zofran. Will reassess. 9:51 AM Patient states that she is feeling better. She is tolerating oral intake. Her heart rate remains in the 110s. Will finish fluids, and assess for discharge to home.  Discussed the patient with Dr. Maryan Rued, who agrees that the patient can be discharged to home.  Follow-up with PCP.  Return for worsening symptoms.  Tolerating PO currently.  Feels much better.  Patient agrees that she can go home, and agrees to come back if needed.  Filed Vitals:   06/25/13 0705  BP: 152/96  Pulse: 105  Temp: 98.3 F (36.8 C)  Resp: [address] [place] 06/25/13 1013

## 2013-06-26 LAB — URINE CULTURE

## 2013-06-27 IMAGING — CR DG ABDOMEN 2V
2 series · 2 of 2 positions shown · non-contrast
Comparison: 11/01/2010

CLINICAL DATA: Left lower quadrant pain.  Nausea and vomiting.

ABDOMEN - 2 VIEW

[w abdomen upright]
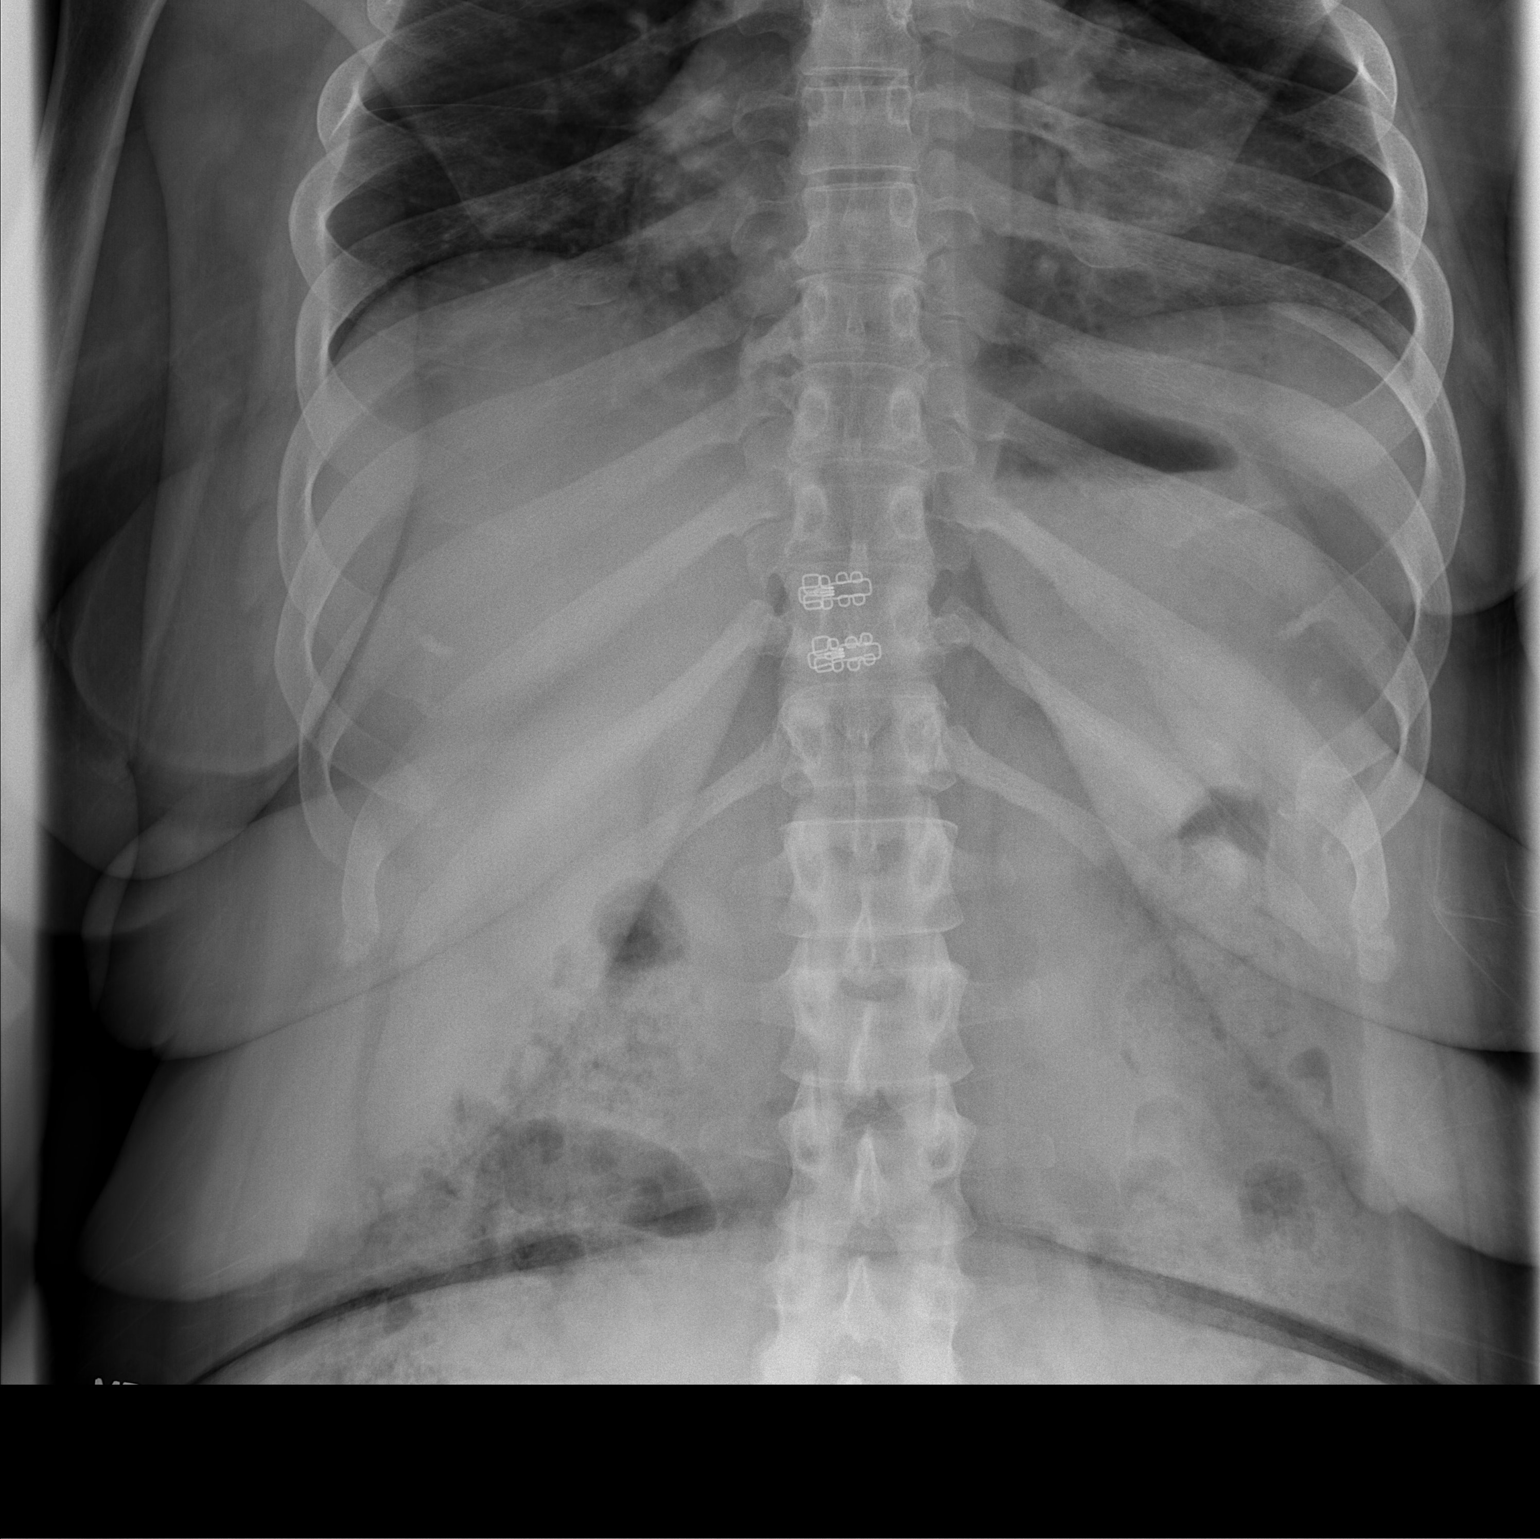

[t abdomen supine]
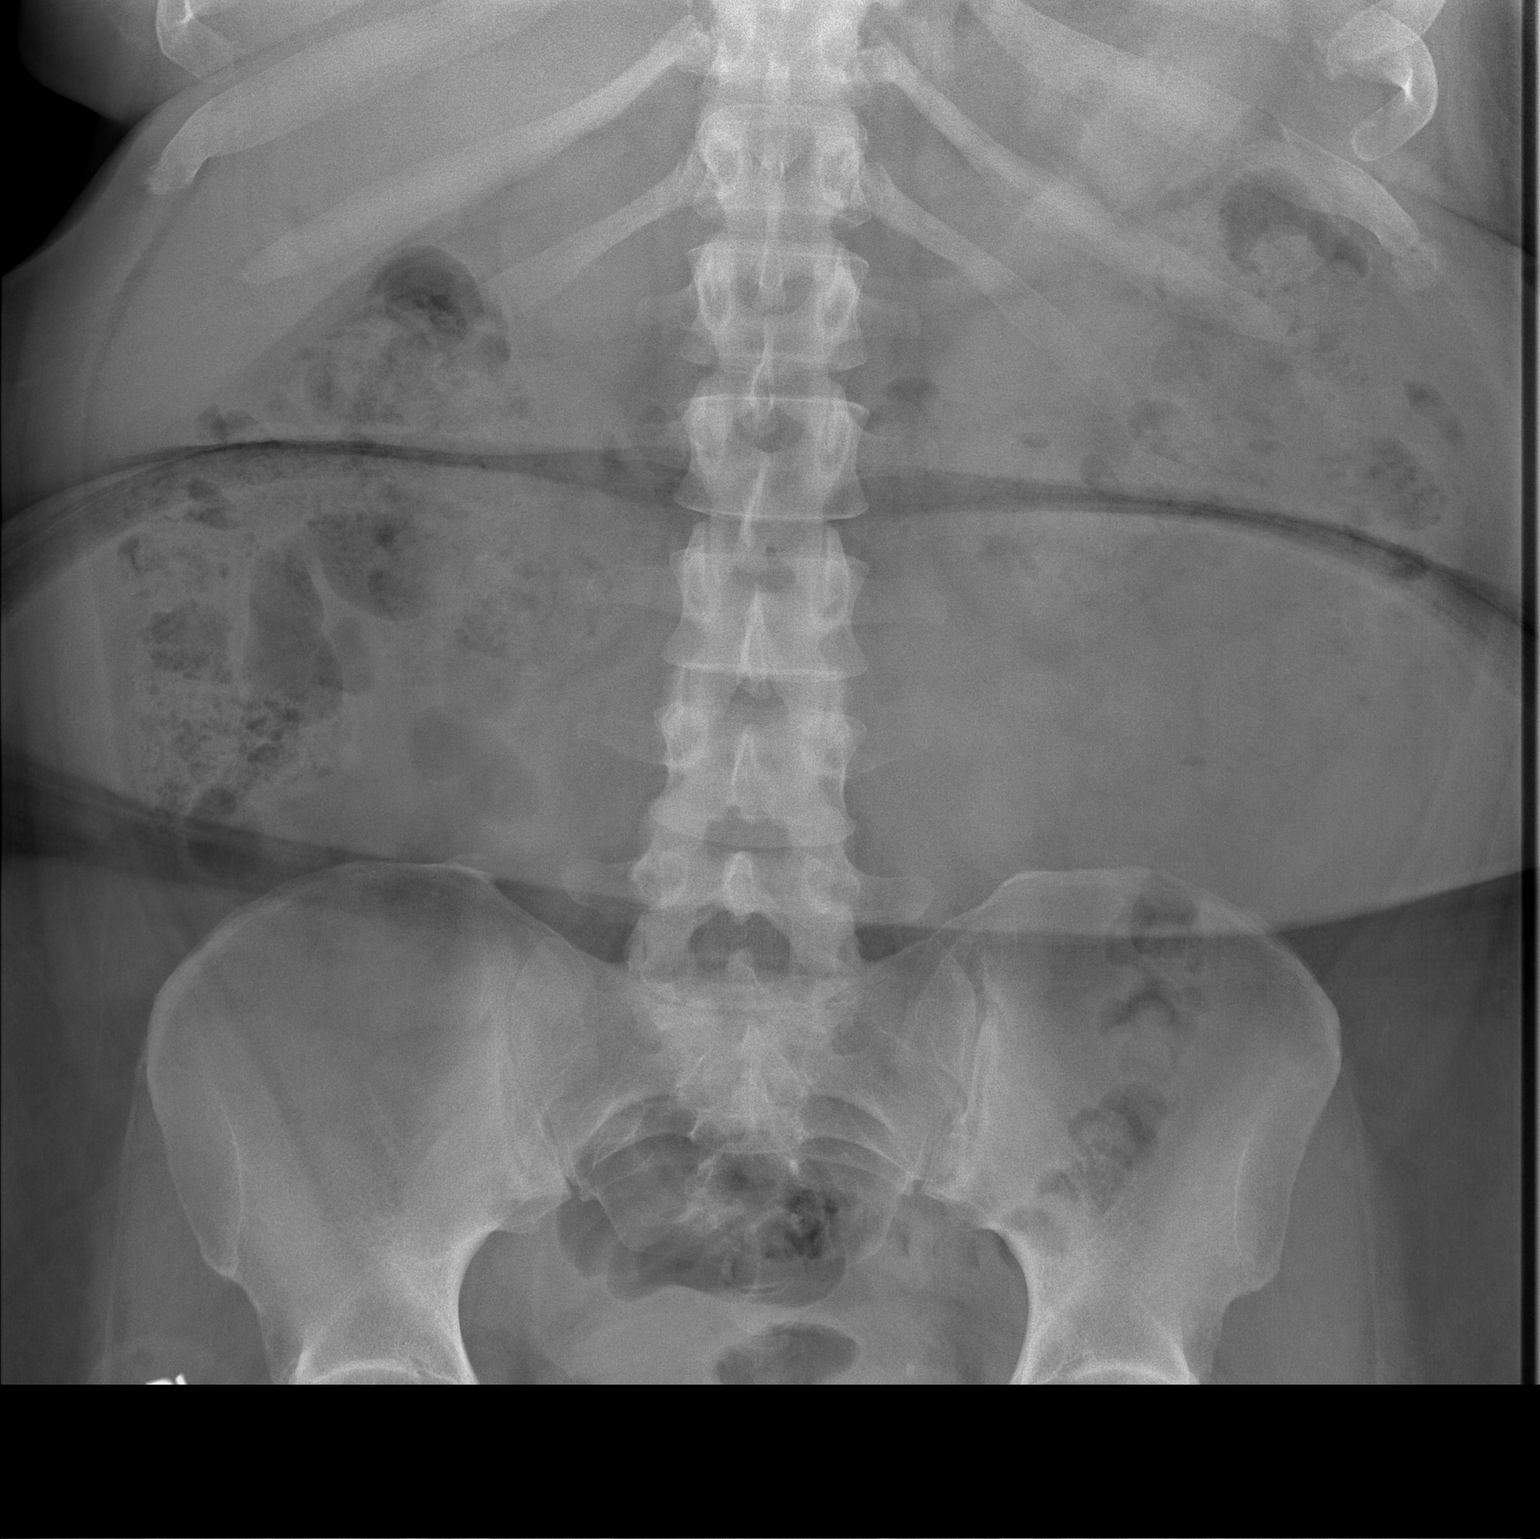

[2 of 2 positions shown; findings below may reference images not displayed]

FINDINGS: The bowel gas pattern is normal.  There is no evidence
of free air.  No radio-opaque calculi or other significant
radiographic abnormality is seen. Tiny left pelvic phlebolith again
noted.
IMPRESSION: Negative.

## 2013-07-10 ENCOUNTER — Encounter: Payer: Self-pay | Admitting: Internal Medicine

## 2013-07-21 ENCOUNTER — Telehealth: Payer: Self-pay | Admitting: *Deleted

## 2013-07-21 ENCOUNTER — Encounter: Payer: Self-pay | Admitting: Internal Medicine

## 2013-07-21 ENCOUNTER — Ambulatory Visit (INDEPENDENT_AMBULATORY_CARE_PROVIDER_SITE_OTHER): Payer: Medicaid Other | Admitting: Internal Medicine

## 2013-07-21 VITALS — BP 134/91 | HR 103 | Temp 97.5°F | Ht 67.0 in | Wt 209.5 lb

## 2013-07-21 DIAGNOSIS — I10 Essential (primary) hypertension: Secondary | ICD-10-CM

## 2013-07-21 DIAGNOSIS — K5900 Constipation, unspecified: Secondary | ICD-10-CM

## 2013-07-21 DIAGNOSIS — E785 Hyperlipidemia, unspecified: Secondary | ICD-10-CM

## 2013-07-21 DIAGNOSIS — F1910 Other psychoactive substance abuse, uncomplicated: Secondary | ICD-10-CM

## 2013-07-21 DIAGNOSIS — E1165 Type 2 diabetes mellitus with hyperglycemia: Secondary | ICD-10-CM

## 2013-07-21 DIAGNOSIS — N390 Urinary tract infection, site not specified: Secondary | ICD-10-CM

## 2013-07-21 DIAGNOSIS — A599 Trichomoniasis, unspecified: Secondary | ICD-10-CM | POA: Insufficient documentation

## 2013-07-21 DIAGNOSIS — E119 Type 2 diabetes mellitus without complications: Secondary | ICD-10-CM

## 2013-07-21 DIAGNOSIS — R112 Nausea with vomiting, unspecified: Secondary | ICD-10-CM

## 2013-07-21 DIAGNOSIS — B354 Tinea corporis: Secondary | ICD-10-CM

## 2013-07-21 LAB — HM DIABETES EYE EXAM

## 2013-07-21 LAB — POCT GLYCOSYLATED HEMOGLOBIN (HGB A1C): HEMOGLOBIN A1C: 9.6

## 2013-07-21 LAB — GLUCOSE, CAPILLARY: Glucose-Capillary: 190 mg/dL — ABNORMAL HIGH (ref 70–99)

## 2013-07-21 MED ORDER — POLYETHYLENE GLYCOL 3350 17 GM/SCOOP PO POWD: 17.0000 g | Freq: Every day | ORAL | Status: DC

## 2013-07-21 MED ORDER — METRONIDAZOLE 50 MG/ML ORAL SUSPENSION: 250.0000 mg | Freq: Three times a day (TID) | ORAL | Status: DC

## 2013-07-21 MED ORDER — KETOCONAZOLE 2 % EX CREA: 1.0000 "application " | TOPICAL_CREAM | Freq: Every day | CUTANEOUS | Status: DC

## 2013-07-21 MED ORDER — SENNOSIDES-DOCUSATE SODIUM 8.6-50 MG PO TABS
1.0000 | ORAL_TABLET | Freq: Every day | ORAL | Status: DC
Start: 2013-07-21 — End: 2013-07-21

## 2013-07-21 MED ORDER — SENNOSIDES-DOCUSATE SODIUM 8.6-50 MG PO TABS: 2.0000 | ORAL_TABLET | Freq: Every day | ORAL | Status: DC

## 2013-07-21 MED ORDER — CEPHALEXIN 250 MG/5ML PO SUSR: 250.0000 mg | Freq: Three times a day (TID) | ORAL | Status: DC

## 2013-07-21 NOTE — Patient Instructions (Addendum)
General Instructions:  We have prescribed liquid antibiotics for your UTI and vaginal infection. We have prescribed medicines for your constipation. IT IS VERY IMPORTANT TO KEEP YOUR APPOINTMENT WITH DR. Benson Norway. Please bring your medicines with you each time you come.   Medicines may be  Eye drops  Herbal   Vitamins  Pills  Seeing these help [place] take care of you.   Treatment Goals:  Goals (1 Years of Data) as of 07/21/13         As of Today 06/25/13 06/25/13 06/25/13 06/25/13     Blood Pressure    . Blood Pressure < 140/90  134/91 137/83 152/96 168/95 109/83     Result Component    . HEMOGLOBIN A1C < 7.0  9.6        . LDL CALC < 100            Progress Toward Treatment Goals:  Treatment Goal 07/21/2013  Hemoglobin A1C unchanged  Blood pressure unchanged    Self Care Goals & Plans:  Self Care Goal 07/21/2013  Manage my medications take my medicines as prescribed; bring my medications to every visit; refill my medications on time; follow the sick day instructions if I am sick  Monitor my health keep track of my blood glucose  Eat healthy foods eat more vegetables; eat fruit for snacks and desserts; eat smaller portions; drink diet soda or water instead of juice or soda  Be physically active find an activity I enjoy    Home Blood Glucose Monitoring 07/21/2013  Check my blood sugar 3 times a day  When to check my blood sugar before meals     Care Management & Community Referrals:  No flowsheet data found.    Trichinosis Trichinosis is an infection caused by eating the raw, or poorly cooked, meat of meat-eating animals that are infected. The infection is caused by eating the encysted larvae (like a small egg with a tiny worm inside) of the nematode (very small worm) called Trichinella spiralis. It is found in the infected meat of these animals. The seriousness of the disease is usually related to the number of larvae eaten. Once the cyst is in the intestines, the larva is  released. These tiny worms then enter the small blood vessels in the intestines and travel throughout the body. They can infect all tissues of the body. SYMPTOMS  When the larvae are in the intestine, they can cause diarrhea and abdominal (belly) pain. There may be swelling around the eyes and muscle aches and pains. The diaphragm (breathing muscle between the chest and abdomen), chest muscles between the ribs, and the muscles of the face and the tongue are also affected. This disease is usually self limited. That means you will usually get well in time without treatment. It can rarely result in death only if there are complications from heavy invasion of the heart, lungs, or central nervous system (brain and spinal cord). DIAGNOSIS  Laboratory blood tests are available that can usually make a positive diagnosis. Examining the infected muscle under the microscope may also help with the diagnosis. If laboratory work is performed, make sure you know how you are to obtain the results. It is your responsibility to follow up and obtain your laboratory results. TREATMENT   There is no known treatment for Trichinosis except for treatment of symptoms. Usually anti-inflammatory medications are used.  Only take over-the-counter or prescription medicines for pain, discomfort, or fever as directed by your caregiver. Discontinue immediately if  you have stomach upset. Take medicine only as directed by your caregiver.  Thiabendazole is used as a medication for known exposure. Steroids are also used for severe cases.  Most symptoms will get better on their own within a year without lasting problems. However, you will be infected for the rest of your life. You cannot pass this infection on to another person even with close personal contact. SEEK IMMEDIATE MEDICAL CARE IF:  You develop any new symptoms such as vomiting, severe headache, stiff or painful neck, chest pain, shortness of breath, trouble breathing, or  develop pain uncontrolled with medications.  You develop new problems or worsening of the problems that originally brought you in for care. Document Released: 07/17/2000 Document Revised: 07/03/2011 Document Reviewed: 08/15/2007 [facility] Patient Information 2014 [place].   Metronidazole  What is this medicine? METRONIDAZOLE (me troe NI da zole) is an antiinfective. It is used to treat certain kinds of bacterial and protozoal infections. It will not work for colds, flu, or other viral infections. This medicine may be used for other purposes; ask your health care provider or pharmacist if you have questions. COMMON BRAND NAME(S): Flagyl What should I tell my health care provider before I take this medicine? They need to know if you have any of these conditions: -anemia or other blood disorders -disease of the nervous system -fungal or yeast infection -if you drink alcohol containing drinks -liver disease -seizures -an unusual or allergic reaction to metronidazole, or other medicines, foods, dyes, or preservatives -pregnant or trying to get pregnant -breast-feeding How should I use this medicine? Take this medicine by mouth with a full glass of water. Follow the directions on the prescription label. Take your medicine at regular intervals. Do not take your medicine more often than directed. Take all of your medicine as directed even if you think you are better. Do not skip doses or stop your medicine early. Talk to your pediatrician regarding the use of this medicine in children. Special care may be needed. Overdosage: If you think you have taken too much of this medicine contact a poison control center or emergency room at once. NOTE: This medicine is only for you. Do not share this medicine with others. What if I miss a dose? If you miss a dose, take it as soon as you can. If it is almost time for your next dose, take only that dose. Do not take double or extra doses. What may  interact with this medicine? Do not take this medicine with any of the following medications: -alcohol or any product that contains alcohol -amprenavir oral solution -cisapride -disulfiram -dofetilide -dronedarone -paclitaxel injection -pimozide -ritonavir oral solution -sertraline oral solution -sulfamethoxazole-trimethoprim injection -thioridazine -ziprasidone This medicine may also interact with the following medications: -cimetidine -lithium -other medicines that prolong the QT interval (cause an abnormal heart rhythm) -phenobarbital -phenytoin -warfarin This list may not describe all possible interactions. Give your health care provider a list of all the medicines, herbs, non-prescription drugs, or dietary supplements you use. Also tell them if you smoke, drink alcohol, or use illegal drugs. Some items may interact with your medicine. What should I watch for while using this medicine? Tell your doctor or health care professional if your symptoms do not improve or if they get worse. You may get drowsy or dizzy. Do not drive, use machinery, or do anything that needs mental alertness until you know how this medicine affects you. Do not stand or sit up quickly, especially if you are  an older patient. This reduces the risk of dizzy or fainting spells. Avoid alcoholic drinks while you are taking this medicine and for three days afterward. Alcohol may make you feel dizzy, sick, or flushed. If you are being treated for a sexually transmitted disease, avoid sexual contact until you have finished your treatment. Your sexual partner may also need treatment. What side effects may I notice from receiving this medicine? Side effects that you should report to your doctor or health care professional as soon as possible: -allergic reactions like skin rash or hives, swelling of the face, lips, or tongue -confusion, clumsiness -difficulty speaking -discolored or sore mouth -dizziness -fever,  infection -numbness, tingling, pain or weakness in the hands or feet -trouble passing urine or change in the amount of urine -redness, blistering, peeling or loosening of the skin, including inside the mouth -seizures -unusually weak or tired -vaginal irritation, dryness, or discharge Side effects that usually do not require medical attention (report to your doctor or health care professional if they continue or are bothersome): -diarrhea -headache -irritability -metallic taste -nausea -stomach pain or cramps -trouble sleeping This list may not describe all possible side effects. Call your doctor for medical advice about side effects. You may report side effects to FDA at 1-800-FDA-1088. Where should I keep my medicine? Keep out of the reach of children. Store at room temperature below 25 degrees C (77 degrees F). Protect from light. Keep container tightly closed. Throw away any unused medicine after the expiration date. NOTE: This sheet is a summary. It may not cover all possible information. If you have questions about this medicine, talk to your doctor, pharmacist, or health care provider.  2014, [employer]vier/Gold Standard. (2012-10-08 14:08:26)

## 2013-07-21 NOTE — Progress Notes (Signed)
   Subjective:    Patient ID: Shirley [name], female    DOB: 10/08/1975, 38 y.o.   MRN: [id number]  HPI  Pt presents for f/u of Diabetes.  Hx significant for medical noncompliance, chronic abdominal pain, nausea and vomiting thought likely secondary to gastroparesis.  Pt has been referred for GYN evaluation to rule out endometriosis but no showed for appointment. She states that she was in the ED at the time.  Has not followed up with GI-Dr. Benson Norway as of yet and states that she was again in the ED at that time.  Has not taken her Lantus in 2 days because she "ran out".  Does not check her cbgs at home.  States that she has not been able to take the antibiotic for trichomoniasis and UTI secondary to not  Being able to "swallow the pills".  Has had decreased appetite for many months now.  Able to drink fluids without an issue.  States that she has been constipated for the past few days but has not eaten much. No abdominal pain, dysuria, vaginal discharge or fever per pt report.  Review of Systems  Constitutional: Positive for fatigue and unexpected weight change.  HENT: Negative.   Eyes: Positive for visual disturbance.  Respiratory: Negative for shortness of breath.   Cardiovascular: Negative for chest pain.  Gastrointestinal: Positive for nausea, vomiting and constipation. Negative for abdominal pain, diarrhea and abdominal distention.  Endocrine: Positive for polyuria.  Genitourinary: Negative for dysuria, hematuria and flank pain.  Musculoskeletal: Negative.   Skin: Positive for rash.  Neurological: Negative.   Psychiatric/Behavioral: Negative.        Objective:   Physical Exam  Constitutional: She is oriented to person, place, and time. She appears well-developed and well-nourished. No distress.  obese  HENT:  Head: Normocephalic and atraumatic.  Eyes: Conjunctivae and EOM are normal. Pupils are equal, round, and reactive to light.  Neck: Normal range of motion. Neck supple. No  thyromegaly present.  Cardiovascular: Regular rhythm, normal heart sounds and intact distal pulses.   Mildly tachycardic  Pulmonary/Chest: Effort normal and breath sounds normal.  Abdominal: Soft. Bowel sounds are normal. She exhibits no distension. There is no tenderness. There is no rebound and no guarding.  obese  Musculoskeletal: She exhibits no edema and no tenderness.  Neurological: She is alert and oriented to person, place, and time.  Skin: Skin is warm and dry. Rash noted.  Few small scaly patches with tinea appearance on abdomen and arms; no borrows noted to suggest scabies  Psychiatric: She has a normal mood and affect.          Assessment & Plan:  See separate problem-list charting for full detailed A/P.  -Trichomoniasis: suspension metronidazole -UTI: susp Keflex -Gastroparesis: appt with Dr. Benson Norway of GI 07/23/2013; states that she is unable to swallow pills secondary to nausea and vomiting, of note she requested Percocet at the end of clinic visit -DM Type 2: poorly controlled -Tinea corpora, likely: ketoconazole

## 2013-07-21 NOTE — Telephone Encounter (Signed)
Antony Haste, pharmacist, at [facility] calls wanting to speak with you about the script for metronidazole sent today please call him asap at [phone]

## 2013-07-22 ENCOUNTER — Observation Stay (HOSPITAL_COMMUNITY)
Admission: EM | Admit: 2013-07-22 | Discharge: 2013-07-25 | Disposition: A | Discharge status: Home / Self Care | Payer: Medicaid Other | Attending: Internal Medicine | Admitting: Internal Medicine

## 2013-07-22 ENCOUNTER — Encounter (HOSPITAL_COMMUNITY): Payer: Self-pay | Admitting: Emergency Medicine

## 2013-07-22 ENCOUNTER — Other Ambulatory Visit: Payer: Self-pay

## 2013-07-22 ENCOUNTER — Emergency Department (HOSPITAL_COMMUNITY): Payer: Medicaid Other

## 2013-07-22 DIAGNOSIS — E1142 Type 2 diabetes mellitus with diabetic polyneuropathy: Secondary | ICD-10-CM | POA: Diagnosis present

## 2013-07-22 DIAGNOSIS — Z8673 Personal history of transient ischemic attack (TIA), and cerebral infarction without residual deficits: Secondary | ICD-10-CM | POA: Insufficient documentation

## 2013-07-22 DIAGNOSIS — B1910 Unspecified viral hepatitis B without hepatic coma: Secondary | ICD-10-CM | POA: Insufficient documentation

## 2013-07-22 DIAGNOSIS — F1910 Other psychoactive substance abuse, uncomplicated: Secondary | ICD-10-CM | POA: Diagnosis present

## 2013-07-22 DIAGNOSIS — E860 Dehydration: Secondary | ICD-10-CM

## 2013-07-22 DIAGNOSIS — E1165 Type 2 diabetes mellitus with hyperglycemia: Secondary | ICD-10-CM

## 2013-07-22 DIAGNOSIS — F1210 Cannabis abuse, uncomplicated: Secondary | ICD-10-CM | POA: Insufficient documentation

## 2013-07-22 DIAGNOSIS — N946 Dysmenorrhea, unspecified: Secondary | ICD-10-CM

## 2013-07-22 DIAGNOSIS — G43909 Migraine, unspecified, not intractable, without status migrainosus: Secondary | ICD-10-CM | POA: Insufficient documentation

## 2013-07-22 DIAGNOSIS — K219 Gastro-esophageal reflux disease without esophagitis: Secondary | ICD-10-CM | POA: Insufficient documentation

## 2013-07-22 DIAGNOSIS — F3289 Other specified depressive episodes: Secondary | ICD-10-CM | POA: Insufficient documentation

## 2013-07-22 DIAGNOSIS — A5900 Urogenital trichomoniasis, unspecified: Secondary | ICD-10-CM | POA: Insufficient documentation

## 2013-07-22 DIAGNOSIS — E119 Type 2 diabetes mellitus without complications: Secondary | ICD-10-CM | POA: Diagnosis present

## 2013-07-22 DIAGNOSIS — G4733 Obstructive sleep apnea (adult) (pediatric): Secondary | ICD-10-CM | POA: Insufficient documentation

## 2013-07-22 DIAGNOSIS — I951 Orthostatic hypotension: Secondary | ICD-10-CM | POA: Insufficient documentation

## 2013-07-22 DIAGNOSIS — I2510 Atherosclerotic heart disease of native coronary artery without angina pectoris: Secondary | ICD-10-CM | POA: Insufficient documentation

## 2013-07-22 DIAGNOSIS — F411 Generalized anxiety disorder: Secondary | ICD-10-CM | POA: Insufficient documentation

## 2013-07-22 DIAGNOSIS — R000 Tachycardia, unspecified: Secondary | ICD-10-CM

## 2013-07-22 DIAGNOSIS — N926 Irregular menstruation, unspecified: Secondary | ICD-10-CM | POA: Insufficient documentation

## 2013-07-22 DIAGNOSIS — R1032 Left lower quadrant pain: Secondary | ICD-10-CM

## 2013-07-22 DIAGNOSIS — Z9119 Patient's noncompliance with other medical treatment and regimen: Secondary | ICD-10-CM | POA: Insufficient documentation

## 2013-07-22 DIAGNOSIS — I259 Chronic ischemic heart disease, unspecified: Secondary | ICD-10-CM | POA: Diagnosis present

## 2013-07-22 DIAGNOSIS — Z91199 Patient's noncompliance with other medical treatment and regimen due to unspecified reason: Secondary | ICD-10-CM | POA: Insufficient documentation

## 2013-07-22 DIAGNOSIS — D72829 Elevated white blood cell count, unspecified: Secondary | ICD-10-CM

## 2013-07-22 DIAGNOSIS — Z794 Long term (current) use of insulin: Secondary | ICD-10-CM | POA: Insufficient documentation

## 2013-07-22 DIAGNOSIS — Z9861 Coronary angioplasty status: Secondary | ICD-10-CM | POA: Insufficient documentation

## 2013-07-22 DIAGNOSIS — IMO0001 Reserved for inherently not codable concepts without codable children: Secondary | ICD-10-CM | POA: Insufficient documentation

## 2013-07-22 DIAGNOSIS — E669 Obesity, unspecified: Secondary | ICD-10-CM | POA: Insufficient documentation

## 2013-07-22 DIAGNOSIS — R079 Chest pain, unspecified: Secondary | ICD-10-CM

## 2013-07-22 DIAGNOSIS — A599 Trichomoniasis, unspecified: Secondary | ICD-10-CM | POA: Diagnosis present

## 2013-07-22 DIAGNOSIS — G609 Hereditary and idiopathic neuropathy, unspecified: Secondary | ICD-10-CM | POA: Insufficient documentation

## 2013-07-22 DIAGNOSIS — E785 Hyperlipidemia, unspecified: Secondary | ICD-10-CM | POA: Insufficient documentation

## 2013-07-22 DIAGNOSIS — K3184 Gastroparesis: Secondary | ICD-10-CM | POA: Insufficient documentation

## 2013-07-22 DIAGNOSIS — D509 Iron deficiency anemia, unspecified: Secondary | ICD-10-CM

## 2013-07-22 DIAGNOSIS — R0789 Other chest pain: Principal | ICD-10-CM | POA: Diagnosis present

## 2013-07-22 DIAGNOSIS — E1149 Type 2 diabetes mellitus with other diabetic neurological complication: Secondary | ICD-10-CM | POA: Insufficient documentation

## 2013-07-22 DIAGNOSIS — I10 Essential (primary) hypertension: Secondary | ICD-10-CM | POA: Diagnosis present

## 2013-07-22 DIAGNOSIS — Z6833 Body mass index (BMI) 33.0-33.9, adult: Secondary | ICD-10-CM | POA: Insufficient documentation

## 2013-07-22 DIAGNOSIS — J45909 Unspecified asthma, uncomplicated: Secondary | ICD-10-CM | POA: Insufficient documentation

## 2013-07-22 DIAGNOSIS — F1410 Cocaine abuse, uncomplicated: Secondary | ICD-10-CM

## 2013-07-22 DIAGNOSIS — F329 Major depressive disorder, single episode, unspecified: Secondary | ICD-10-CM | POA: Insufficient documentation

## 2013-07-22 DIAGNOSIS — Z87891 Personal history of nicotine dependence: Secondary | ICD-10-CM | POA: Insufficient documentation

## 2013-07-22 LAB — LACTIC ACID, PLASMA: LACTIC ACID, VENOUS: 2.1 mmol/L (ref 0.5–2.2)

## 2013-07-22 LAB — BASIC METABOLIC PANEL
BUN: 12 mg/dL (ref 6–23)
CALCIUM: 9.6 mg/dL (ref 8.4–10.5)
CO2: 21 mEq/L (ref 19–32)
CREATININE: 0.87 mg/dL (ref 0.50–1.10)
Chloride: 99 mEq/L (ref 96–112)
GFR calc non Af Amer: 83 mL/min — ABNORMAL LOW (ref >90)
Glucose, Bld: 220 mg/dL — ABNORMAL HIGH (ref 70–99)
Potassium: 3.7 mEq/L (ref 3.7–5.3)
Sodium: 138 mEq/L (ref 137–147)

## 2013-07-22 LAB — DIFFERENTIAL
BASOS ABS: 0 10*3/uL (ref 0.0–0.1)
Basophils Relative: 0 % (ref 0–1)
EOS PCT: 3 % (ref 0–5)
Eosinophils Absolute: 0.4 10*3/uL (ref 0.0–0.7)
Lymphocytes Relative: 23 % (ref 12–46)
Lymphs Abs: 2.7 10*3/uL (ref 0.7–4.0)
Monocytes Absolute: 0.7 10*3/uL (ref 0.1–1.0)
Monocytes Relative: 6 % (ref 3–12)
NEUTROS ABS: 8 10*3/uL — AB (ref 1.7–7.7)
Neutrophils Relative %: 68 % (ref 43–77)

## 2013-07-22 LAB — TROPONIN I
Troponin I: <0.3 ng/mL (ref <0.30)
Troponin I: <0.3 ng/mL (ref <0.30)

## 2013-07-22 LAB — CBC
HCT: 30.7 % — ABNORMAL LOW (ref 36.0–46.0)
Hemoglobin: 10.2 g/dL — ABNORMAL LOW (ref 12.0–15.0)
MCH: 21.4 pg — AB (ref 26.0–34.0)
MCHC: 33.2 g/dL (ref 30.0–36.0)
MCV: 64.5 fL — ABNORMAL LOW (ref 78.0–100.0)
PLATELETS: 594 10*3/uL — AB (ref 150–400)
RBC: 4.76 MIL/uL (ref 3.87–5.11)
RDW: 18.1 % — AB (ref 11.5–15.5)
WBC: 12.4 10*3/uL — ABNORMAL HIGH (ref 4.0–10.5)

## 2013-07-22 LAB — I-STAT TROPONIN, ED: Troponin i, poc: 0 ng/mL (ref 0.00–0.08)

## 2013-07-22 LAB — GLUCOSE, CAPILLARY: GLUCOSE-CAPILLARY: 163 mg/dL — AB (ref 70–99)

## 2013-07-22 LAB — LIPID PANEL
Cholesterol: 169 mg/dL (ref 0–200)
HDL: 41 mg/dL (ref >39)
LDL Cholesterol: 108 mg/dL — ABNORMAL HIGH (ref 0–99)
Total CHOL/HDL Ratio: 4.1 Ratio
Triglycerides: 100 mg/dL (ref <150)
VLDL: 20 mg/dL (ref 0–40)

## 2013-07-22 LAB — RAPID URINE DRUG SCREEN, HOSP PERFORMED
AMPHETAMINES: NOT DETECTED
Barbiturates: NOT DETECTED
Benzodiazepines: NOT DETECTED
Cocaine: POSITIVE — AB
Opiates: NOT DETECTED
TETRAHYDROCANNABINOL: NOT DETECTED

## 2013-07-22 LAB — PRO B NATRIURETIC PEPTIDE: PRO B NATRI PEPTIDE: 224.5 pg/mL — AB (ref 0–125)

## 2013-07-22 LAB — MICROALBUMIN / CREATININE URINE RATIO
Creatinine, Urine: 159.4 mg/dL
Microalb Creat Ratio: 7.8 mg/g (ref 0.0–30.0)
Microalb, Ur: 1.25 mg/dL (ref 0.00–1.89)

## 2013-07-22 LAB — CBG MONITORING, ED: Glucose-Capillary: 233 mg/dL — ABNORMAL HIGH (ref 70–99)

## 2013-07-22 LAB — MRSA PCR SCREENING: MRSA by PCR: NEGATIVE

## 2013-07-22 MED ORDER — HEPARIN SODIUM (PORCINE) 5000 UNIT/ML IJ SOLN
5000.0000 [IU] | Freq: Three times a day (TID) | INTRAMUSCULAR | Status: DC
  Administered 2013-07-22 – 2013-07-25 (×9): 5000 [IU] via SUBCUTANEOUS
  Filled 2013-07-22 (×12): qty 1

## 2013-07-22 MED ORDER — POLYETHYLENE GLYCOL 3350 17 GM/SCOOP PO POWD
17.0000 g | Freq: Every day | ORAL | Status: DC | PRN
  Filled 2013-07-22: qty 255

## 2013-07-22 MED ORDER — INSULIN ASPART 100 UNIT/ML ~~LOC~~ SOLN
0.0000 [IU] | Freq: Three times a day (TID) | SUBCUTANEOUS | Status: DC
  Administered 2013-07-22: 5 [IU] via SUBCUTANEOUS
  Administered 2013-07-22: 3 [IU] via SUBCUTANEOUS
  Administered 2013-07-23: 2 [IU] via SUBCUTANEOUS
  Administered 2013-07-23: 5 [IU] via SUBCUTANEOUS
  Administered 2013-07-23 – 2013-07-24 (×2): 3 [IU] via SUBCUTANEOUS
  Administered 2013-07-24: 17:00:00 via SUBCUTANEOUS
  Administered 2013-07-24 – 2013-07-25 (×2): 3 [IU] via SUBCUTANEOUS
  Administered 2013-07-25: 5 [IU] via SUBCUTANEOUS
  Filled 2013-07-22: qty 1

## 2013-07-22 MED ORDER — AMLODIPINE BESYLATE 10 MG PO TABS
10.0000 mg | ORAL_TABLET | Freq: Every day | ORAL | Status: DC
  Administered 2013-07-22 – 2013-07-25 (×4): 10 mg via ORAL
  Filled 2013-07-22 (×5): qty 1

## 2013-07-22 MED ORDER — GABAPENTIN 300 MG PO CAPS
300.0000 mg | ORAL_CAPSULE | Freq: Three times a day (TID) | ORAL | Status: DC
  Administered 2013-07-22 – 2013-07-25 (×9): 300 mg via ORAL
  Filled 2013-07-22 (×11): qty 1

## 2013-07-22 MED ORDER — SENNOSIDES-DOCUSATE SODIUM 8.6-50 MG PO TABS
2.0000 | ORAL_TABLET | Freq: Every evening | ORAL | Status: DC | PRN
  Filled 2013-07-22: qty 2

## 2013-07-22 MED ORDER — METRONIDAZOLE 500 MG PO TABS: 2000.0000 mg | ORAL_TABLET | Freq: Once | ORAL | Status: DC

## 2013-07-22 MED ORDER — MOMETASONE FURO-FORMOTEROL FUM 100-5 MCG/ACT IN AERO
2.0000 | INHALATION_SPRAY | Freq: Two times a day (BID) | RESPIRATORY_TRACT | Status: DC
  Administered 2013-07-22 – 2013-07-25 (×6): 2 via RESPIRATORY_TRACT
  Filled 2013-07-22: qty 8.8

## 2013-07-22 MED ORDER — ACETAMINOPHEN 650 MG RE SUPP: 650.0000 mg | Freq: Four times a day (QID) | RECTAL | Status: DC | PRN

## 2013-07-22 MED ORDER — ASPIRIN EC 81 MG PO TBEC
81.0000 mg | DELAYED_RELEASE_TABLET | Freq: Every day | ORAL | Status: DC
  Administered 2013-07-23 – 2013-07-25 (×3): 81 mg via ORAL
  Filled 2013-07-22 (×4): qty 1

## 2013-07-22 MED ORDER — PANTOPRAZOLE SODIUM 40 MG PO TBEC
40.0000 mg | DELAYED_RELEASE_TABLET | Freq: Every day | ORAL | Status: DC
  Administered 2013-07-22 – 2013-07-25 (×4): 40 mg via ORAL
  Filled 2013-07-22 (×5): qty 1

## 2013-07-22 MED ORDER — INSULIN ASPART 100 UNIT/ML ~~LOC~~ SOLN
0.0000 [IU] | Freq: Every day | SUBCUTANEOUS | Status: DC
  Administered 2013-07-24: 2 [IU] via SUBCUTANEOUS

## 2013-07-22 MED ORDER — NITROGLYCERIN 2 % TD OINT
1.0000 [in_us] | TOPICAL_OINTMENT | Freq: Once | TRANSDERMAL | Status: AC
  Administered 2013-07-22: 1 [in_us] via TOPICAL
  Filled 2013-07-22: qty 1

## 2013-07-22 MED ORDER — INSULIN GLARGINE 100 UNIT/ML ~~LOC~~ SOLN
30.0000 [IU] | Freq: Every day | SUBCUTANEOUS | Status: DC
  Administered 2013-07-22 – 2013-07-24 (×3): 30 [IU] via SUBCUTANEOUS
  Filled 2013-07-22 (×5): qty 0.3

## 2013-07-22 MED ORDER — METRONIDAZOLE 500 MG PO TABS
2000.0000 mg | ORAL_TABLET | Freq: Once | ORAL | Status: AC
  Administered 2013-07-22: 2000 mg via ORAL
  Filled 2013-07-22: qty 4

## 2013-07-22 MED ORDER — CALCIUM CARBONATE ANTACID 500 MG PO CHEW
2.0000 | CHEWABLE_TABLET | Freq: Every day | ORAL | Status: DC | PRN
  Filled 2013-07-22: qty 2

## 2013-07-22 MED ORDER — ASPIRIN EC 325 MG PO TBEC: 325.0000 mg | DELAYED_RELEASE_TABLET | Freq: Once | ORAL | Status: DC

## 2013-07-22 MED ORDER — METRONIDAZOLE 50 MG/ML ORAL SUSPENSION: 2000.0000 mg | Freq: Once | ORAL | Status: DC

## 2013-07-22 MED ORDER — HYDROCODONE-ACETAMINOPHEN 5-325 MG PO TABS
1.0000 | ORAL_TABLET | Freq: Four times a day (QID) | ORAL | Status: DC | PRN
  Administered 2013-07-22 (×2): 2 via ORAL
  Filled 2013-07-22 (×2): qty 2

## 2013-07-22 MED ORDER — ONDANSETRON HCL 4 MG/2ML IJ SOLN
4.0000 mg | Freq: Four times a day (QID) | INTRAMUSCULAR | Status: DC | PRN
  Administered 2013-07-22: 4 mg via INTRAVENOUS
  Filled 2013-07-22: qty 2

## 2013-07-22 MED ORDER — ATORVASTATIN CALCIUM 80 MG PO TABS
80.0000 mg | ORAL_TABLET | Freq: Every day | ORAL | Status: DC
  Administered 2013-07-22 – 2013-07-24 (×3): 80 mg via ORAL
  Filled 2013-07-22 (×4): qty 1

## 2013-07-22 MED ORDER — AMITRIPTYLINE HCL 50 MG PO TABS
50.0000 mg | ORAL_TABLET | Freq: Every day | ORAL | Status: DC
  Administered 2013-07-22 – 2013-07-23 (×2): 50 mg via ORAL
  Filled 2013-07-22 (×3): qty 1

## 2013-07-22 MED ORDER — LORAZEPAM 1 MG PO TABS
1.0000 mg | ORAL_TABLET | Freq: Once | ORAL | Status: AC
  Administered 2013-07-22: 1 mg via ORAL
  Filled 2013-07-22: qty 1

## 2013-07-22 MED ORDER — FENTANYL CITRATE 0.05 MG/ML IJ SOLN
50.0000 ug | Freq: Once | INTRAMUSCULAR | Status: AC
  Administered 2013-07-22: 50 ug via INTRAVENOUS
  Filled 2013-07-22: qty 2

## 2013-07-22 MED ORDER — ACETAMINOPHEN 325 MG PO TABS: 650.0000 mg | ORAL_TABLET | Freq: Four times a day (QID) | ORAL | Status: DC | PRN

## 2013-07-22 MED ORDER — LORAZEPAM 2 MG/ML IJ SOLN
0.5000 mg | Freq: Once | INTRAMUSCULAR | Status: AC
  Administered 2013-07-22: 0.5 mg via INTRAVENOUS
  Filled 2013-07-22: qty 1

## 2013-07-22 MED ORDER — HYDROCODONE-ACETAMINOPHEN 5-325 MG PO TABS
1.0000 | ORAL_TABLET | ORAL | Status: DC | PRN
  Administered 2013-07-22 – 2013-07-23 (×3): 2 via ORAL
  Administered 2013-07-24: 1 via ORAL
  Administered 2013-07-24 (×3): 2 via ORAL
  Administered 2013-07-25 (×2): 1 via ORAL
  Administered 2013-07-25: 2 via ORAL
  Administered 2013-07-25: 1 via ORAL
  Filled 2013-07-22: qty 2
  Filled 2013-07-22 (×2): qty 1
  Filled 2013-07-22 (×3): qty 2
  Filled 2013-07-22 (×2): qty 1
  Filled 2013-07-22: qty 2
  Filled 2013-07-22 (×2): qty 1
  Filled 2013-07-22: qty 2

## 2013-07-22 NOTE — Assessment & Plan Note (Addendum)
Pt states that she was not able to take the "big pill" for treatment Jan 2015 for Trich infection.  Reports that she is able to swallow liquids. -Metronidazole 250 mg oral suspension q6h x 7 days.   ADDENDUM: no metronidazole suspension available in [place] per [facility] -will advise pt to crush and take with apple sauce

## 2013-07-22 NOTE — Assessment & Plan Note (Signed)
Unclear etiology, although gastroparesis high on differential given years of poor diabetic control. Pt no-showed to GYN for evaluation of possible endometriosis.  Has not followed up with GI as previously arranged.  States that she was in the ED on those occasions and no one called her to rescheduled. -appt with Dr. Benson Norway GI 07/23/2013 (pt counseled on importance of keeping this appt).

## 2013-07-22 NOTE — Assessment & Plan Note (Signed)
BP Readings from Last 3 Encounters:  07/22/13 134/88  07/21/13 134/91  06/25/13 137/83    Lab Results  Component Value Date   NA 138 07/22/2013   K 3.7 07/22/2013   CREATININE 0.87 07/22/2013    Assessment: Blood pressure control: mildly elevated Progress toward BP goal:  unchanged Comments: reports compliance with amlodipine 10 mg qd but states that she hasnt been able to swallow pills  Plan: Medications:  continue current medications Educational resources provided: brochure;handout;video Self management tools provided:   Other plans: cont amlodipine 10 mg qd as there is concerned regarding compliance today.

## 2013-07-22 NOTE — Assessment & Plan Note (Signed)
Check lipid panel  

## 2013-07-22 NOTE — ED Provider Notes (Signed)
CSN: [id number]     Arrival date & time 07/22/13  0430 History   First MD Initiated Contact with Patient 07/22/13 [phone]     Chief Complaint  Patient presents with  . Chest Pain     (Consider location/radiation/quality/duration/timing/severity/associated sxs/prior Treatment) HPI  This is a 38 year old female with a history of chronic abdominal pain, hypertension, hyperlipidemia, diabetes, and coronary artery disease who presents with chest pain. Patient reports using cocaine and marijuana prior to presentation. She reports sternal chest pain that is nonradiating. She has associated shortness of breath and nausea. Currently her pain is 10 out of 10.  Patient states "this feels like my heart attack in 2008." She denies any vomiting or diarrhea. She denies a cough or fevers.  Past Medical History  Diagnosis Date  . Thrombocytosis     Hem/Onc suggested 2/2 chronic hepatits and/or iron deficiency anemia  . Iron deficiency anemia   . N&V (nausea and vomiting)     Chronic. Unclear etiology with multiple admission and ED visits. CT abdomen with and without contrast (02/2011)  showed no acute process. Gastic Emptying scan (01/2010) was normal. Ultrasound of the abdomen was within normal limits. Hepatitis B viral load was undectable. HIV NR. EGD - gastritis, Hpylori + s/p Rx  . Abdominal pain   . Hypertension   . Hyperlipidemia   . Diabetes mellitus type 2, uncontrolled, with complications   . Recurrent boils   . Back pain   . OSA (obstructive sleep apnea)   . 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 Cath 12/10- no restenosis or significant CAD progression  . Irregular menses     Small ovarian follicles seen on UX(3/23)  . History of pyelonephritis     H/o GrpB Pyelonephritis (9/06) and UTI- 07/11- E.Coli, 12/10- GBS  . Abscess of tunica vaginalis     10/09- Abundant S. aureus-  sensitive to all abx  . Obesity   . Asthma   . Gastritis   . Depression   . Hepatitis B, chronic     Hep BeAb+,Hep B cAb+ & Hep BsAg+ (9/06)  . Peripheral neuropathy   . Fibromyalgia   . GERD (gastroesophageal reflux disease)   . Migraine   . Anxiety   . Gastroparesis     secondary to poorly controlled DM, last emptying study performed 01/2010  was normal but may be falsely positive as pt was on reglan  . Blood dyscrasia   . H/O: CVA (cerebrovascular accident)     history of remote right cerebellar infarct noted on head CT at least since 10/2011  . Cancer    Past Surgical History  Procedure Laterality Date  . Cesarean section  1997   Family History  Problem Relation Age of Onset  . Diabetes Father    History  Substance Use Topics  . Smoking status: Former Smoker    Types: Cigarettes    Quit date: 04/24/1996  . Smokeless tobacco: Never Used     Comment: quit smoking cigarettes age 37  . Alcohol Use: Yes     Comment: occasionally   OB History   Grav Para Term Preterm Abortions TAB SAB Ect Mult Living                 Review of Systems  Constitutional: Negative for fever and chills.  Respiratory: Positive for chest tightness and shortness of breath. Negative for  cough.   Cardiovascular: Negative for chest pain and leg swelling.  Gastrointestinal: Positive for nausea. Negative for vomiting, abdominal pain and diarrhea.  Genitourinary: Negative for dysuria.  Musculoskeletal: Negative for back pain.  Skin: Negative for wound.  Neurological: Negative for headaches.  Psychiatric/Behavioral: Negative for confusion.  All other systems reviewed and are negative.      Allergies  Lisinopril and Morphine and related  Home Medications   Current Outpatient Rx  Name  Route  Sig  Dispense  Refill  . amitriptyline (ELAVIL) 25 MG tablet   Oral   Take 2 tablets (50 mg total) by mouth at bedtime.   60 tablet   3   . amLODipine (NORVASC) 10 MG tablet   Oral   Take 1  tablet (10 mg total) by mouth daily.   30 tablet   6   . calcium carbonate (TUMS - DOSED IN MG ELEMENTAL CALCIUM) 500 MG chewable tablet   Oral   Chew 2 tablets by mouth daily.          . Fluticasone-Salmeterol (ADVAIR) 100-50 MCG/DOSE AEPB   Inhalation   Inhale 1 puff into the lungs every 12 (twelve) hours.   60 each   6   . gabapentin (NEURONTIN) 300 MG capsule   Oral   Take 300 mg by mouth 3 (three) times daily.         Marland Kitchen HYDROcodone-acetaminophen (NORCO/VICODIN) 5-325 MG per tablet   Oral   Take 1-2 tablets by mouth every 4 (four) hours as needed for moderate pain.   7 tablet   0   . HYDROcodone-acetaminophen (NORCO/VICODIN) 5-325 MG per tablet   Oral   Take 1-2 tablets by mouth every 6 (six) hours as needed.   13 tablet   0   . LORazepam (ATIVAN) 1 MG tablet   Oral   Take 1 tablet (1 mg total) by mouth 3 (three) times daily as needed for anxiety.   15 tablet   0   . omeprazole (PRILOSEC) 40 MG capsule   Oral   Take 1 capsule (40 mg total) by mouth daily.   31 capsule   6   . ondansetron (ZOFRAN-ODT) 4 MG disintegrating tablet   Oral   Take 4 mg by mouth every 4 (four) hours as needed for nausea or vomiting.         . pregabalin (LYRICA) 75 MG capsule   Oral   Take 1 capsule (75 mg total) by mouth 2 (two) times daily.   60 capsule   6   . albuterol (PROVENTIL HFA;VENTOLIN HFA) 108 (90 BASE) MCG/ACT inhaler   Inhalation   Inhale 2 puffs into the lungs every 4 (four) hours as needed for wheezing or shortness of breath.         . cephALEXin (KEFLEX) 250 MG/5ML suspension   Oral   Take 5 mLs (250 mg total) by mouth 3 (three) times daily.   100 mL   0   . insulin aspart (NOVOLOG) 100 UNIT/ML injection   Subcutaneous   Inject 2-12 Units into the skin 3 (three) times daily as needed for high blood sugar. Sliding scale- If sugar 150-199: inject 2 unit;  200-249: 4 units, 250-299: 7 units; 300-349: 10 units; Over 350: 12 units         . insulin  glargine (LANTUS) 100 UNIT/ML injection   Subcutaneous   Inject 50 Units into the skin at bedtime.         Marland Kitchen  ketoconazole (NIZORAL) 2 % cream   Topical   Apply 1 application topically daily.   15 g   0   . metroNIDAZOLE (FLAGYL) 50 mg/ml oral suspension   Oral   Take 5 mLs (250 mg total) by mouth 3 (three) times daily.   100 mL   0   . polyethylene glycol powder (GLYCOLAX/MIRALAX) powder   Oral   Take 17 g by mouth daily.   255 g   0   . senna-docusate (SENOKOT-S) 8.6-50 MG per tablet   Oral   Take 2 tablets by mouth at bedtime.   30 tablet   3    BP 147/98  Pulse 86  Temp(Src) 98.5 F (36.9 C) (Oral)  Resp 14  SpO2 100%  LMP 06/22/2013 Physical Exam  Nursing note and vitals reviewed. Constitutional: She is oriented to person, place, and time.  Disheveled, smells heavily of marijuana  HENT:  Head: Normocephalic and atraumatic.  Mucous membranes dry  Eyes: Pupils are equal, round, and reactive to light.  Neck: Neck supple. No JVD present.  Cardiovascular: Normal rate, regular rhythm and normal heart sounds.   No murmur heard. Pulmonary/Chest: Effort normal. No respiratory distress. She has wheezes.  Abdominal: Soft. There is no tenderness.  Musculoskeletal: She exhibits no edema.  Neurological: She is alert and oriented to person, place, and time.  Skin: Skin is warm and dry.  Psychiatric: She has a normal mood and affect.    ED Course  Procedures (including critical care time) Labs Review Labs Reviewed  CBC - Abnormal; Notable for the following:    WBC 12.4 (*)    Hemoglobin 10.2 (*)    HCT 30.7 (*)    MCV 64.5 (*)    MCH 21.4 (*)    RDW 18.1 (*)    Platelets 594 (*)    All other components within normal limits  BASIC METABOLIC PANEL - Abnormal; Notable for the following:    Glucose, Bld 220 (*)    GFR calc non Af Amer 83 (*)    All other components within normal limits  PRO B NATRIURETIC PEPTIDE - Abnormal; Notable for the following:    Pro  B Natriuretic peptide (BNP) 224.5 (*)    All other components within normal limits  URINE RAPID DRUG SCREEN (HOSP PERFORMED)  Randolm Idol, ED   Imaging Review Dg Chest 2 View  07/22/2013   CLINICAL DATA:  Chest pain, shortness of breath.  EXAM: CHEST  2 VIEW  COMPARISON:  DG ABD ACUTE W/CHEST dated 06/19/2012  FINDINGS: Cardiomediastinal silhouette is unremarkable. Mild central pulmonary vasculature congestion is low inspiratory examination. The lungs are clear without pleural effusions or focal consolidations. Trachea projects midline and there is no pneumothorax. Soft tissue planes and included osseous structures are non-suspicious.  IMPRESSION: Mild central pulmonary vasculature congestion.  .   Electronically Signed   By: Elon Alas   On: 07/22/2013 05:09     EKG Interpretation None     Independently reviewed by myself: Normal sinus rhythm with a rate of 88, no evidence of acute ST elevation or ischemia MDM   Final diagnoses:  Chest pain  Cocaine abuse    Patient presents with chest pain. History of coronary artery disease. Chest pain is in the setting of cocaine use. Patient was given aspirin and nitroglycerin. She is also given Fentanyl. EKG is nonischemic. Chest x-ray shows mild pulmonary vascular congestion. Troponin is negative. Patient reports ongoing pain. Some improvement with nitroglycerin. Will admit  for full chest pain rule out to IM teaching service.    Merryl Hacker, MD 07/22/13 [phone]

## 2013-07-22 NOTE — Progress Notes (Signed)
Visit to patient while in hospital. Will call after patient is discharged to assist with transition of care. 

## 2013-07-22 NOTE — Assessment & Plan Note (Signed)
States that she only uses cocaine for pain relief.  Also smokes marijuana.  Requesting Percocet but request declined. Pt has received last few Percocet/Vicodin from ED. Requesting referral to Pain Clinic.  Explained to pt that she would need to be off of drugs before acceptance by pain clinic.  Pt voiced understanding.

## 2013-07-22 NOTE — ED Notes (Addendum)
Pt. arrived with EMS from home reports mid chest pain with SOB onset this morning , deneis nausea or diaphoresis , pt. received 4 baby ASA and 1 NTG sl with no relief . Pt. has been smoking marijuana and Cocaine this morning / recently diagnosed with abdominal cancer.

## 2013-07-22 NOTE — ED Notes (Signed)
Attempted report. RN unavailable.

## 2013-07-22 NOTE — ED Notes (Signed)
Patient transported to X-ray 

## 2013-07-22 NOTE — ED Notes (Signed)
Pt calling on call bell asking for pain medication. Reports 10/10 burning chest pain. Pt given 2 viccodin. Pt asking, "how come they arent giving me IV pain medication." Explained to patient that we would try PO meds first since those were available at this time. Pt agreeable with plan.

## 2013-07-22 NOTE — Assessment & Plan Note (Signed)
Keflex suspension prescribed as pt reports that she is unable to swallow pills.

## 2013-07-22 NOTE — Assessment & Plan Note (Addendum)
Lab Results  Component Value Date   HGBA1C 9.6 07/21/2013   HGBA1C 12.7* 04/29/2013   HGBA1C 13.4* 04/13/2013     Assessment: Diabetes control: poor control (HgbA1C >9%) Progress toward A1C goal:  unchanged Comments: better control but still not at goal, has not taken Lantus in "a couple" of days because she ran but states she can get more Lantus "tomorrow".  Plan: Medications:  continue current medications Home glucose monitoring: Frequency: 3 times a day Timing: before meals Instruction/counseling given: reminded to bring blood glucose meter & log to each visit, reminded to bring medications to each visit and discussed sick day management Educational resources provided: brochure;handout Self management tools provided:   Other plans: she has made progress in her diabetic control but there is still question of compliance, losing weight secondary to persistent nausea and vomiting in addition to crack cocaine use thus notable weight lose, will cont Lantus 50 units qhs and SSI for now

## 2013-07-22 NOTE — H&P (Signed)
INTERNAL MEDICINE TEACHING SERVICE Attending Admission Note  Date: 07/22/2013  Patient name: Shirley [name]  Medical record number: [id number]  Date of birth: 1975/11/06    I have seen and evaluated Shirley [name] and discussed their care with the Residency Team.  38 yr old woman with a pmhx significant for HTN, substance abuse,Type 2 DM, asthma, chronic Hep B virus infection, presented with chest pain. She states that she used cocaine about 10-11 pm last night and around 2 am suffered from sudden onset pressure-like CP. She states she felt SOB and was having difficulty swallowing. She states she began to have profuse diaphoresis. She states she's had a heart attack in the past and this felt just like it. She states that she was given NTG but this improved her chest pain only slightly, mostly causing a headache. She has a hx of a posterior descending artery total occlusion in 2008 and underwent PCA with repeat LHC showing patency of this area in 2008 and 2010.  Filed Vitals:   07/22/13 1453  BP: 146/113  Pulse: 94  Temp: 97.9 F (36.6 C)  Resp:   RR 14 O2 sat 100% RA  GEN: AAOx3, NAD HEENT: EOMI, PERRL, no icterus, no adenopathy. CV: S1S2, no m/r/g, RRR PULM: CTA bilat ABD/GI: Soft, mild LLQ tenderness, no guarding, +BS, no distention. LE/UE: 2/4 pulses no c/c/e. NEURO: CN II-XII intact, no focal deficits.  EKG: NSR, no ST/T wave changes. I see no significant Q waves. Trop I neg x 2. CXR: mild venous congestion. UDS: +cocaine  A/P 1. Chest pain: Of main concern is cocaine induced vasospasm. She tells me on my exam that she is still having CP (pressure-like), but it is getting better. She tells me that this feels like her "old heart attack".  I would continue to cycle Trop this afternoon and watch her overnight on telemetry. Repeat serial EKGs to see if any dynamic changes or changes in rhythm. I agree to avoid BB at this time. She received Ativan this afternoon but tells me  didn't really help. If CP worsens, would give additional doses of Ativan and consider NTG gtt. 2. Type 2 DM: She has a hx of non adherence. Agree with tx. 3. Asthma: I don't think she's having an asthma exacerbation.  4. Rest per resident note. If CP continues to improve, would D/C early in AM and have her abstain from cocaine use as well as follow up in clinic (GI as well due to a pending appointment).  Dominic Pea, DO, [facility] 07/22/2013, 3:19 PM

## 2013-07-22 NOTE — Assessment & Plan Note (Signed)
Trial of ketoconazole x1 week

## 2013-07-22 NOTE — H&P (Signed)
Date: 07/22/2013               Patient Name:  Shirley [name] MRN: [id number]  DOB: 1975/08/07 Age / Sex: 38 y.o., female   PCP: Jeralene Huff, MD         Medical Service: Internal Medicine Teaching Service         Attending Physician: Dr. Dominic Pea, DO    First Contact: Dr. Naaman Plummer Pager: [phone]  Second Contact: Dr. Eulas Post Pager: [phone]       After Hours (After 5p/  First Contact Pager: [phone]  weekends / holidays): Second Contact Pager: [phone]   Chief Complaint: chest pain  History of Present Illness:  The patient is a 38 yo woman, history of HTN, DM2, asthma, chronic HBV, prior CVA, presenting with chest pain.  On the evening prior to admission, the patient used cocaine at around 11pm.  At around 2pm, the patient experienced acute onset of mid-sternal non-radiating chest "tightness" while sitting, associated with shortness of breath and diaphoresis, though without nausea/vomiting.  The patient was not changed by exertion, rest, nitro, or food.  The pain was not worsened by lying flat, but felt minimally better when leaning forward.  She notes no cough, sore throat, rhinorrhea, fever, orthopnea, PND, or LE edema.  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  She does not take aspirin, and has no first degree relatives with prior MI.  Meds: Current Facility-Administered Medications  Medication Dose Route Frequency Provider Last Rate Last Dose  . aspirin EC tablet 325 mg  325 mg Oral Once Hester Mates, MD      . atorvastatin (LIPITOR) tablet 80 mg  80 mg Oral q1800 Hester Mates, MD       Current Outpatient Prescriptions  Medication Sig Dispense Refill  . amitriptyline (ELAVIL) 25 MG tablet Take 2 tablets (50 mg total) by mouth at bedtime.  60 tablet  3  . amLODipine (NORVASC) 10 MG tablet Take 1 tablet (10 mg total) by mouth  daily.  30 tablet  6  . calcium carbonate (TUMS - DOSED IN MG ELEMENTAL CALCIUM) 500 MG chewable tablet Chew 2 tablets by mouth daily.       . Fluticasone-Salmeterol (ADVAIR) 100-50 MCG/DOSE AEPB Inhale 1 puff into the lungs every 12 (twelve) hours.  60 each  6  . gabapentin (NEURONTIN) 300 MG capsule Take 300 mg by mouth 3 (three) times daily.      Marland Kitchen HYDROcodone-acetaminophen (NORCO/VICODIN) 5-325 MG per tablet Take 1-2 tablets by mouth every 4 (four) hours as needed for moderate pain.  7 tablet  0  . HYDROcodone-acetaminophen (NORCO/VICODIN) 5-325 MG per tablet Take 1-2 tablets by mouth every 6 (six) hours as needed.  13 tablet  0  . LORazepam (ATIVAN) 1 MG tablet Take 1 tablet (1 mg total) by mouth 3 (three) times daily as needed for anxiety.  15 tablet  0  . omeprazole (PRILOSEC) 40 MG capsule Take 1 capsule (40 mg total) by mouth daily.  31 capsule  6  . ondansetron (ZOFRAN-ODT) 4 MG disintegrating tablet Take 4 mg by mouth every 4 (four) hours as needed for nausea or vomiting.      . pregabalin (LYRICA) 75 MG capsule Take 1 capsule (75 mg total) by mouth 2 (two) times daily.  60 capsule  6  .  albuterol (PROVENTIL HFA;VENTOLIN HFA) 108 (90 BASE) MCG/ACT inhaler Inhale 2 puffs into the lungs every 4 (four) hours as needed for wheezing or shortness of breath.      . cephALEXin (KEFLEX) 250 MG/5ML suspension Take 5 mLs (250 mg total) by mouth 3 (three) times daily.  100 mL  0  . insulin aspart (NOVOLOG) 100 UNIT/ML injection Inject 2-12 Units into the skin 3 (three) times daily as needed for high blood sugar. Sliding scale- If sugar 150-199: inject 2 unit;  200-249: 4 units, 250-299: 7 units; 300-349: 10 units; Over 350: 12 units      . insulin glargine (LANTUS) 100 UNIT/ML injection Inject 50 Units into the skin at bedtime.      Marland Kitchen ketoconazole (NIZORAL) 2 % cream Apply 1 application topically daily.  15 g  0  . metroNIDAZOLE (FLAGYL) 50 mg/ml oral suspension Take 5 mLs (250 mg total) by mouth 3  (three) times daily.  100 mL  0  . polyethylene glycol powder (GLYCOLAX/MIRALAX) powder Take 17 g by mouth daily.  255 g  0  . senna-docusate (SENOKOT-S) 8.6-50 MG per tablet Take 2 tablets by mouth at bedtime.  30 tablet  3    Allergies: Allergies as of 07/22/2013 - Review Complete 07/22/2013  Allergen Reaction Noted  . Lisinopril Nausea Only 03/17/2008  . Morphine and related Itching 04/15/2013   Past Medical History  Diagnosis Date  . Thrombocytosis     Hem/Onc suggested 2/2 chronic hepatits and/or iron deficiency anemia  . Iron deficiency anemia   . N&V (nausea and vomiting)     Chronic. Unclear etiology with multiple admission and ED visits. CT abdomen with and without contrast (02/2011)  showed no acute process. Gastic Emptying scan (01/2010) was normal. Ultrasound of the abdomen was within normal limits. Hepatitis B viral load was undectable. HIV NR. EGD - gastritis, Hpylori + s/p Rx  . Hypertension   . Hyperlipidemia   . Diabetes mellitus type 2, uncontrolled, with complications   . Recurrent boils   . Back pain   . OSA (obstructive sleep apnea)   . 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 Cath 12/10- no restenosis or significant CAD progression  . Irregular menses     Small ovarian follicles seen on QQ(5/95)  . History of pyelonephritis     H/o GrpB Pyelonephritis (9/06) and UTI- 07/11- E.Coli, 12/10- GBS  . Abscess of tunica vaginalis     10/09- Abundant S. aureus- sensitive to all abx  . Obesity   . Asthma   . Gastritis   . Depression   . Hepatitis B, chronic     Hep BeAb+,Hep B cAb+ & Hep BsAg+ (9/06)  . Peripheral neuropathy   . Fibromyalgia   . GERD (gastroesophageal reflux disease)   . Migraine   . Anxiety   . Gastroparesis     secondary to poorly controlled DM, last emptying study performed 01/2010  was normal but may be falsely positive as pt  was on reglan  . Blood dyscrasia   . H/O: CVA (cerebrovascular accident)     history of remote right cerebellar infarct noted on head CT at least since 10/2011  . Cancer    Past Surgical History  Procedure Laterality Date  . Cesarean section  1997   Family History  Problem Relation Age of Onset  . Diabetes Father  History   Social History  . Marital Status: Single    Spouse Name: N/A    Number of Children: N/A  . Years of Education: 11   Occupational History  . other     unemployed   Social History Main Topics  . Smoking status: Former Smoker    Types: Cigarettes    Quit date: 04/24/1996  . Smokeless tobacco: Never Used     Comment: quit smoking cigarettes age 33  . Alcohol Use: Yes     Comment: occasionally  . Drug Use: No  . Sexual Activity: Yes   Other Topics Concern  . 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   Also works at [employer] having to lift elderly individuals.          Review of Systems: General: no fevers, chills, changes in weight, changes in appetite Skin: no rash HEENT: no blurry vision, hearing changes, sore throat Pulm: see HPI CV: see HPI Abd: +chronic LLQ abd pain (unchanged GU: no dysuria, hematuria, polyuria Ext: no arthralgias, myalgias Neuro: no weakness, numbness, or tingling  Physical Exam: Blood pressure 124/69, pulse 98, temperature 98 F (36.7 C), temperature source Oral, resp. rate 13, last menstrual period 06/22/2013, SpO2 99.00%. General: alert, cooperative, appears mildly uncomfortable HEENT: PERRL, EOMI, oropharynx non-erythematous Neck: supple Lungs: clear to ascultation bilaterally, normal work of respiration, no wheezes, rales, ronchi Heart: regular rate and rhythm, no murmurs, gallops, or rubs Abdomen: soft, minimally tender to LLQ palpation, non-distended, normal bowel sounds, no guarding/rebound  tenderness Extremities: no cyanosis, clubbing, or edema Neurologic: alert & oriented X3, cranial nerves II-XII intact, strength grossly intact, sensation intact to light touch  Lab results: Basic Metabolic Panel:  Recent Labs  07/22/13 0440  NA 138  K 3.7  CL 99  CO2 21  GLUCOSE 220*  BUN 12  CREATININE 0.87  CALCIUM 9.6   CBC:  Recent Labs  07/22/13 0440  WBC 12.4*  HGB 10.2*  HCT 30.7*  MCV 64.5*  PLT 594*   BNP:  Recent Labs  07/22/13 0440  PROBNP 224.5*   CBG:  Recent Labs  07/21/13 1343  GLUCAP 190*   Hemoglobin A1C:  Recent Labs  07/21/13 1352  HGBA1C 9.6   Fasting Lipid Panel:  Recent Labs  07/21/13 1457  CHOL 169  HDL 41  LDLCALC 108*  TRIG 100  CHOLHDL 4.1   Urine Drug Screen: Drugs of Abuse     Component Value Date/Time   LABOPIA NONE DETECTED 07/22/2013 0548   LABOPIA NEGATIVE 02/21/2010 2201   COCAINSCRNUR POSITIVE* 07/22/2013 0548   COCAINSCRNUR NEGATIVE 02/21/2010 2201   LABBENZ NONE DETECTED 07/22/2013 0548   LABBENZ NEGATIVE 02/21/2010 2201   AMPHETMU NONE DETECTED 07/22/2013 0548   AMPHETMU NEGATIVE 02/21/2010 2201   THCU NONE DETECTED 07/22/2013 0548   LABBARB NONE DETECTED 07/22/2013 0548     Imaging results:  Dg Chest 2 View  07/22/2013   CLINICAL DATA:  Chest pain, shortness of breath.  EXAM: CHEST  2 VIEW  COMPARISON:  DG ABD ACUTE W/CHEST dated 06/19/2012  FINDINGS: Cardiomediastinal silhouette is unremarkable. Mild central pulmonary vasculature congestion is low inspiratory examination. The lungs are clear without pleural effusions or focal consolidations. Trachea projects midline and there is no pneumothorax. Soft tissue planes and included osseous structures are non-suspicious.  IMPRESSION: Mild central pulmonary vasculature congestion.  .   Electronically  Signed   By: Elon Alas   On: 07/22/2013 05:09    Other results: EKG: NSR, ?q waves in II, III, aVF (old), no acute ST abnormalities  Assessment & Plan  by Problem:  # Cocaine-induced chest pain - The patient presents with atypical chest pain shortly after using cocaine, concerning for cocaine-induced vasospasm.  Differential includes ACS (TIMI score = 2, low risk.  Prior cath 2010 showed no CAD, though history of PDA occlusion s/p PCA).  Less likely GI (history of chronic abd pain, gastroparesis) vs msk (no chest wall ttp). -cycle trops, EKG in AM -aspirin, atorvastatin -avoid BB, given cocaine use -vicodin prn for pain, s/p ativan x1 for cocaine-induced vasospasm -recent A1C = 9.6, LDL = 108 (07/21/13) -will avoid consulting cardiology at this time, as pt likely would not benefit from cath (due to potential for worsening of cocaine-induced vasospasm) -consider outpatient stress test  # Uncontrolled DM2 - The patient has a history of DM2, with last A1C = 9.6 (3/30).  The patient has a history of medication non-compliance.  She has been prescribed Lantus 50 and SSI. -will give Lantus 30 and SSI, given unknown actual insulin requirement -continue gabapentin  # Chronic elevated AG - The patient presents with an AG = 18.  Chart review reveals her last 3 anion gaps to be 18, 15, and 18.  Bicarb is normal at 21. -check lactic acid  # Trichomoniasis - seen on UA 05/20/13.  The patient was prescribed treatment, but never took this medication. -treat with Flagyl 2 g once -will need to discuss with patient how to treat her partner before discharge  # Abnormal UA - The patient had an abnormal UA 06/25/13, with positive nitrites, moderate LE, and 3-6 WBC's.  The patient presently notes no dysuria, urinary frequency, urinary urgency, or suprapubic pain.  The patient was prescribed Keflex for this, but never took this.  Given lack of symptoms, I believe we do not need a repeat UA or treat at this time.  # Asthma - chronic, stable -continue dulera  # HTN - chronic, stable -continue amlodipine  # Prophy - heparin  Dispo: Disposition is deferred at this  time, awaiting improvement of current medical problems. Anticipated discharge in approximately 1 day(s).  Of note: the patient has an appointment with Dr. Benson Norway tomorrow for work-up of chronic abdominal pain.  The patient has no-showed to prior visits with Dr. Benson Norway, and after extensive effort by the [facility], it seems this is the patient's "last chance" to show up to an appointment.  If above work-up negative, we will try to discharge the patient in time to make this appointment.  The patient does have a current PCP Mayra Reel Beverly Sessions, MD) and does need an [facility] hospital follow-up appointment after discharge.  Signed: Hester Mates, MD 07/22/2013, 7:41 AM

## 2013-07-23 DIAGNOSIS — R079 Chest pain, unspecified: Secondary | ICD-10-CM

## 2013-07-23 DIAGNOSIS — E1165 Type 2 diabetes mellitus with hyperglycemia: Secondary | ICD-10-CM

## 2013-07-23 DIAGNOSIS — A599 Trichomoniasis, unspecified: Secondary | ICD-10-CM

## 2013-07-23 DIAGNOSIS — IMO0001 Reserved for inherently not codable concepts without codable children: Secondary | ICD-10-CM

## 2013-07-23 DIAGNOSIS — F1410 Cocaine abuse, uncomplicated: Secondary | ICD-10-CM

## 2013-07-23 DIAGNOSIS — I10 Essential (primary) hypertension: Secondary | ICD-10-CM

## 2013-07-23 DIAGNOSIS — J45909 Unspecified asthma, uncomplicated: Secondary | ICD-10-CM

## 2013-07-23 LAB — GLUCOSE, CAPILLARY
GLUCOSE-CAPILLARY: 235 mg/dL — AB (ref 70–99)
Glucose-Capillary: 137 mg/dL — ABNORMAL HIGH (ref 70–99)
Glucose-Capillary: 98 mg/dL (ref 70–99)
Glucose-Capillary: 192 mg/dL — ABNORMAL HIGH (ref 70–99)
Glucose-Capillary: 226 mg/dL — ABNORMAL HIGH (ref 70–99)
Glucose-Capillary: 174 mg/dL — ABNORMAL HIGH (ref 70–99)

## 2013-07-23 MED ORDER — SODIUM CHLORIDE 0.9 % IV BOLUS (SEPSIS)
1000.0000 mL | Freq: Once | INTRAVENOUS | Status: AC
  Administered 2013-07-23: 1000 mL via INTRAVENOUS

## 2013-07-23 MED ORDER — GLUCERNA SHAKE PO LIQD: 237.0000 mL | Freq: Every day | ORAL | Status: DC

## 2013-07-23 MED ORDER — SODIUM CHLORIDE 0.9 % IV SOLN
INTRAVENOUS | Status: DC
  Administered 2013-07-23 – 2013-07-24 (×2): via INTRAVENOUS
  Administered 2013-07-24: 1000 mL via INTRAVENOUS
  Administered 2013-07-24: 999 mL via INTRAVENOUS
  Administered 2013-07-25: 04:00:00 via INTRAVENOUS

## 2013-07-23 NOTE — Progress Notes (Addendum)
Subjective: Patient has continued intermittent chest "tightness", denies SOB/diaphoresis. Chest tightness is made worse w/ all movement. She feels dizzy when she stands up x 1 week. She has intermittent palpitations that have occurred intermittently for a "while."  Also c/o cough productive of yellow sputum x 2 days. No F/C, nasal congestion, ST.  Objective: Vital signs in last 24 hours: Filed Vitals:   07/23/13 0458 07/23/13 0729 07/23/13 0751 07/23/13 0913  BP: 134/94  132/86 126/87  Pulse: 93  95   Temp: 98.6 F (37 C)  97.5 F (36.4 C)   TempSrc: Oral  Oral   Resp: 17  20   Height:      Weight:      SpO2: 97% 100% 100%    Weight change:   Intake/Output Summary (Last 24 hours) at 07/23/13 1132 Last data filed at 07/22/13 2100  Gross per 24 hour  Intake     60 ml  Output    750 ml  Net   -690 ml   Physical Exam General: alert, cooperative, appears mildly uncomfortable HEENT: PERRL, vision grossly intact Neck: supple Lungs: clear to ascultation bilaterally, normal work of respiration, no wheezes, rales, ronchi Heart: tachycardic to 114, regular rhythm, no murmurs, gallops, or rubs Abdomen: soft, mildly tender in LLQ, normal bowel sounds Extremities: no BLE, warm Neurologic: alert & oriented X3, cranial nerves II-XII grossly intact, strength grossly intact, sensation intact to light touch  Lab Results: Basic Metabolic Panel:  Recent Labs Lab 07/22/13 0440  NA 138  K 3.7  CL 99  CO2 21  GLUCOSE 220*  BUN 12  CREATININE 0.87  CALCIUM 9.6   CBC:  Recent Labs Lab 07/22/13 0440  WBC 12.4*  NEUTROABS 8.0*  HGB 10.2*  HCT 30.7*  MCV 64.5*  PLT 594*   Cardiac Enzymes:  Recent Labs Lab 07/22/13 1032 07/22/13 1550 07/22/13 2200  TROPONINI <0.30 <0.30 <0.30   BNP:  Recent Labs Lab 07/22/13 0440  PROBNP 224.5*   CBG:  Recent Labs Lab 07/21/13 1343 07/22/13 1249 07/22/13 1713 07/22/13 1944 07/23/13 0750  GLUCAP 190* 233* 163* 137* 98    Hemoglobin A1C:  Recent Labs Lab 07/21/13 1352  HGBA1C 9.6   Fasting Lipid Panel:  Recent Labs Lab 07/21/13 1457  CHOL 169  HDL 41  LDLCALC 108*  TRIG 100  CHOLHDL 4.1   Urine Drug Screen: Drugs of Abuse     Component Value Date/Time   LABOPIA NONE DETECTED 07/22/2013 0548   LABOPIA NEGATIVE 02/21/2010 2201   COCAINSCRNUR POSITIVE* 07/22/2013 0548   COCAINSCRNUR NEGATIVE 02/21/2010 2201   LABBENZ NONE DETECTED 07/22/2013 0548   LABBENZ NEGATIVE 02/21/2010 2201   AMPHETMU NONE DETECTED 07/22/2013 0548   AMPHETMU NEGATIVE 02/21/2010 2201   THCU NONE DETECTED 07/22/2013 0548   LABBARB NONE DETECTED 07/22/2013 0548     Micro Results: Recent Results (from the past 240 hour(s))  MRSA PCR SCREENING     Status: None   Collection Time    07/22/13  2:47 PM      Result Value Ref Range Status   MRSA by PCR NEGATIVE  NEGATIVE Final   Comment:            The GeneXpert MRSA Assay (FDA     approved for NASAL specimens     only), is one component of a     comprehensive MRSA colonization     surveillance program. It is not     intended to diagnose MRSA  infection nor to guide or     monitor treatment for     MRSA infections.   Studies/Results: Dg Chest 2 View  07/22/2013   CLINICAL DATA:  Chest pain, shortness of breath.  EXAM: CHEST  2 VIEW  COMPARISON:  DG ABD ACUTE W/CHEST dated 06/19/2012  FINDINGS: Cardiomediastinal silhouette is unremarkable. Mild central pulmonary vasculature congestion is low inspiratory examination. The lungs are clear without pleural effusions or focal consolidations. Trachea projects midline and there is no pneumothorax. Soft tissue planes and included osseous structures are non-suspicious.  IMPRESSION: Mild central pulmonary vasculature congestion.  .   Electronically Signed   By: Elon Alas   On: 07/22/2013 05:09   Medications: I have reviewed the patient's current medications. Scheduled Meds: . amitriptyline  50 mg Oral QHS  . amLODipine   10 mg Oral Daily  . aspirin EC  81 mg Oral Daily  . atorvastatin  80 mg Oral q1800  . feeding supplement (GLUCERNA SHAKE)  237 mL Oral Q1500  . gabapentin  300 mg Oral TID  . heparin  5,000 Units Subcutaneous 3 times per day  . insulin aspart  0-15 Units Subcutaneous TID WC  . insulin aspart  0-5 Units Subcutaneous QHS  . insulin glargine  30 Units Subcutaneous QHS  . mometasone-formoterol  2 puff Inhalation BID  . pantoprazole  40 mg Oral Daily   Continuous Infusions:  PRN Meds:.acetaminophen, acetaminophen, calcium carbonate, HYDROcodone-acetaminophen, ondansetron (ZOFRAN) IV, polyethylene glycol powder, senna-docusate  Assessment/Plan:  # Cocaine-induced chest pain - Patient with continued atypical chest pain. Troponins negative x 3, so ACS ruled out. proBNP not elevated, so CHF much less likely. Recent A1C = 9.6, LDL = 108 (07/21/13). Statin and ASA have been started. DDX cocaine induced vasospasm vs acute bronchitis/viral URI causing muscle strain. Patient reports productive cough x 2 days, which could have quite possibly caused her to strain a muscle in her chest wall. She has mild leukocytosis (downtrending from 13.5-->12.4) and is tachycardic, which may be signs of infection. Do not suspect PNA as CXR did not show evidence of such.  -repeat EKG this AM shows sinus tachycardia -checking orthostatics -IVF- NS at 125cc/hr -repeat UA -aspirin, atorvastatin  -avoid BB, given cocaine use  -vicodin prn for pain -consider outpatient stress test   # Uncontrolled DM2 - The patient has a history of DM2, with last A1C = 9.6 (3/30). The patient has a history of medication non-compliance. She has been prescribed Lantus 50 and SSI, though unclear of what she takes at home.  - Lantus 30 and SSI, given unknown actual insulin requirement - CBGs 98-163 overnight  -continue gabapentin   # Chronic elevated AG - Unclear etiology. AG = 18 on admission and AG today 18 again. Per chart review, this is  not atypical for her ( last 3 anion gaps 18, 15, and 18). Bicarb wnl. Lactic acid wnl. Renal function wnl.  # Trichomoniasis - seen on UA 05/20/13. The patient was prescribed treatment, but never took this medication.  -s/p Flagyl 2 g one dose 3/31 -will need to discuss with patient how to treat her partner before discharge   # Asthma - chronic, stable  -continue dulera   # HTN - chronic, stable  -continue amlodipine   # PPX - heparin  Dispo: Disposition is deferred at this time, awaiting improvement of current medical problems.  Anticipated discharge in approximately 1 day(s).   The patient does have a current PCP Mayra Reel Beverly Sessions, MD) and  does need an [facility] hospital follow-up appointment after discharge.  The patient does not have transportation limitations that hinder transportation to clinic appointments.  .Services Needed at time of discharge: Y = Yes, Blank = No PT:   OT:   RN:   Equipment:   Other:     LOS: 1 day   Rebecca Eaton, MD 07/23/2013, 11:32 AM

## 2013-07-23 NOTE — Progress Notes (Signed)
Internal Medicine Attending  Date: 07/23/2013  Patient name: Shirley [name] Medical record number: [id number] Date of birth: 06-Aug-1975 Age: 38 y.o. Gender: female  I saw and evaluated the patient on A.M rounds , and discussed her care with housestaff.  I reviewed the resident's note by Dr. Mechele Claude and I agree with the resident's findings and plans as documented in her note.

## 2013-07-23 NOTE — Progress Notes (Addendum)
INITIAL NUTRITION ASSESSMENT  DOCUMENTATION CODES Per approved criteria  -Obesity Unspecified   INTERVENTION:  Glucerna Shake po daily, each supplement provides 220 kcals, 10 gm protein RD to follow for nutrition care plan  NUTRITION DIAGNOSIS: Inadequate oral intake related to poor appetite as evidenced by boyfriend report  Goal: Pt to meet >/= 90% of their estimated nutrition needs   Monitor:  PO & supplemental intake, weight, labs, I/O's  Reason for Assessment: Malnutrition Screening Tool Report  38 y.o. female  Admitting Dx: Atypical chest pain  ASSESSMENT: 38 y.o. woman with a PMH significant for HTN, substance abuse,Type 2 DM, asthma, chronic Hep B virus infection, presented with chest pain. Pt reported using cocaine about 10-11 pm last night and around 2 am suffered from sudden onset pressure-like CP.   Patient sleeping upon RD visit; unable to wake; boyfriend at bedside; reports pt has had a poor appetite for a couple days PTA; breakfast meal present on pt's tray table; per wt readings, pt's weight fluctuates; would benefit from nutrition supplement -- RD to order.  No muscle or subcutaneous fat depletion noticed.  Height: Ht Readings from Last 1 Encounters:  07/22/13 5\' 7"  (1.702 m)    Weight: Wt Readings from Last 1 Encounters:  07/22/13 215 lb 13.3 oz (97.9 kg)    Ideal Body Weight: 135 lb  % Ideal Body Weight: 159%  Wt Readings from Last 10 Encounters:  07/22/13 215 lb 13.3 oz (97.9 kg)  07/21/13 209 lb 8 oz (95.029 kg)  05/08/13 192 lb (87.091 kg)  04/29/13 214 lb 4.8 oz (97.206 kg)  04/22/13 226 lb 8 oz (102.74 kg)  04/14/13 212 lb 8.4 oz (96.4 kg)  11/04/12 233 lb 3.2 oz (105.779 kg)  09/22/12 221 lb (100.245 kg)  07/15/12 221 lb 6.4 oz (100.426 kg)  06/21/12 214 lb 4.6 oz (97.2 kg)    Usual Body Weight: 226 lb  % Usual Body Weight: 95%  BMI:  Body mass index is 33.8 kg/(m^2).  Estimated Nutritional Needs: Kcal: 1800-2000 Protein:  90-100 gm Fluid: 1.8-2.0 L  Skin: Intact  Diet Order: Carb Control  EDUCATION NEEDS: -No education needs identified at this time   Intake/Output Summary (Last 24 hours) at 07/23/13 1121 Last data filed at 07/22/13 2100  Gross per 24 hour  Intake     60 ml  Output    750 ml  Net   -690 ml    Labs:   Recent Labs Lab 07/22/13 0440  NA 138  K 3.7  CL 99  CO2 21  BUN 12  CREATININE 0.87  CALCIUM 9.6  GLUCOSE 220*    CBG (last 3)   Recent Labs  07/22/13 1713 07/22/13 1944 07/23/13 0750  GLUCAP 163* 137* 98    Scheduled Meds: . amitriptyline  50 mg Oral QHS  . amLODipine  10 mg Oral Daily  . aspirin EC  81 mg Oral Daily  . atorvastatin  80 mg Oral q1800  . gabapentin  300 mg Oral TID  . heparin  5,000 Units Subcutaneous 3 times per day  . insulin aspart  0-15 Units Subcutaneous TID WC  . insulin aspart  0-5 Units Subcutaneous QHS  . insulin glargine  30 Units Subcutaneous QHS  . mometasone-formoterol  2 puff Inhalation BID  . pantoprazole  40 mg Oral Daily    Continuous Infusions:   Past Medical History  Diagnosis Date  . Thrombocytosis     Hem/Onc suggested 2/2 chronic hepatits and/or iron  deficiency anemia  . Iron deficiency anemia   . N&V (nausea and vomiting)     Chronic. Unclear etiology with multiple admission and ED visits. CT abdomen with and without contrast (02/2011)  showed no acute process. Gastic Emptying scan (01/2010) was normal. Ultrasound of the abdomen was within normal limits. Hepatitis B viral load was undectable. HIV NR. EGD - gastritis, Hpylori + s/p Rx  . Hypertension   . Hyperlipidemia   . Diabetes mellitus type 2, uncontrolled, with complications   . Recurrent boils   . Back pain   . OSA (obstructive sleep apnea)   . 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 Cath 12/10- no restenosis or significant CAD  progression  . Irregular menses     Small ovarian follicles seen on ZO(1/09)  . History of pyelonephritis     H/o GrpB Pyelonephritis (9/06) and UTI- 07/11- E.Coli, 12/10- GBS  . Abscess of tunica vaginalis     10/09- Abundant S. aureus- sensitive to all abx  . Obesity   . Asthma   . Gastritis   . Depression   . Hepatitis B, chronic     Hep BeAb+,Hep B cAb+ & Hep BsAg+ (9/06)  . Peripheral neuropathy   . Fibromyalgia   . GERD (gastroesophageal reflux disease)   . Migraine   . Anxiety   . Gastroparesis     secondary to poorly controlled DM, last emptying study performed 01/2010  was normal but may be falsely positive as pt was on reglan  . Blood dyscrasia   . H/O: CVA (cerebrovascular accident)     history of remote right cerebellar infarct noted on head CT at least since 10/2011  . Cancer     Past Surgical History  Procedure Laterality Date  . Cesarean section  [address] [place], LDN Pager #: [phone] After-Hours Pager #: [phone]

## 2013-07-23 NOTE — Progress Notes (Signed)
UR completed 

## 2013-07-24 ENCOUNTER — Other Ambulatory Visit: Payer: Self-pay

## 2013-07-24 DIAGNOSIS — I951 Orthostatic hypotension: Secondary | ICD-10-CM

## 2013-07-24 DIAGNOSIS — R0789 Other chest pain: Secondary | ICD-10-CM

## 2013-07-24 DIAGNOSIS — R000 Tachycardia, unspecified: Secondary | ICD-10-CM

## 2013-07-24 LAB — GLUCOSE, CAPILLARY
GLUCOSE-CAPILLARY: 221 mg/dL — AB (ref 70–99)
Glucose-Capillary: 194 mg/dL — ABNORMAL HIGH (ref 70–99)
Glucose-Capillary: 187 mg/dL — ABNORMAL HIGH (ref 70–99)
Glucose-Capillary: 277 mg/dL — ABNORMAL HIGH (ref 70–99)

## 2013-07-24 LAB — BASIC METABOLIC PANEL
BUN: 10 mg/dL (ref 6–23)
CO2: 20 mEq/L (ref 19–32)
CREATININE: 0.7 mg/dL (ref 0.50–1.10)
Calcium: 8.2 mg/dL — ABNORMAL LOW (ref 8.4–10.5)
Chloride: 106 mEq/L (ref 96–112)
GFR calc non Af Amer: >90 mL/min (ref >90)
GLUCOSE: 192 mg/dL — AB (ref 70–99)
Potassium: 4 mEq/L (ref 3.7–5.3)
Sodium: 138 mEq/L (ref 137–147)

## 2013-07-24 LAB — URINALYSIS, ROUTINE W REFLEX MICROSCOPIC
BILIRUBIN URINE: NEGATIVE
Ketones, ur: NEGATIVE mg/dL
Nitrite: NEGATIVE
Protein, ur: NEGATIVE mg/dL
SPECIFIC GRAVITY, URINE: 1.022 (ref 1.005–1.030)
Urobilinogen, UA: 1 mg/dL (ref 0.0–1.0)
pH: 6 (ref 5.0–8.0)

## 2013-07-24 LAB — URINE MICROSCOPIC-ADD ON

## 2013-07-24 LAB — CBC
HCT: 26.3 % — ABNORMAL LOW (ref 36.0–46.0)
HEMOGLOBIN: 8.4 g/dL — AB (ref 12.0–15.0)
MCH: 20.9 pg — ABNORMAL LOW (ref 26.0–34.0)
MCHC: 31.9 g/dL (ref 30.0–36.0)
MCV: 65.4 fL — AB (ref 78.0–100.0)
Platelets: 501 10*3/uL — ABNORMAL HIGH (ref 150–400)
RBC: 4.02 MIL/uL (ref 3.87–5.11)
RDW: 18.2 % — AB (ref 11.5–15.5)
WBC: 10.9 10*3/uL — ABNORMAL HIGH (ref 4.0–10.5)

## 2013-07-24 MED ORDER — SODIUM CHLORIDE 0.9 % IV BOLUS (SEPSIS)
1000.0000 mL | Freq: Once | INTRAVENOUS | Status: AC
  Administered 2013-07-23: 1000 mL via INTRAVENOUS

## 2013-07-24 MED ORDER — NITROGLYCERIN 0.4 MG SL SUBL
SUBLINGUAL_TABLET | SUBLINGUAL | Status: AC
  Administered 2013-07-24: 0.4 mg
  Filled 2013-07-24: qty 1

## 2013-07-24 NOTE — Progress Notes (Signed)
Upon entering patient's room struck by strong odor of Marijuana. Pt and significant other made aware smoking of any kind as well as using any illicit drugs is not allowed on the premises. Pt is jittery and slow to respond verbally. Both patient and significant other deny smoking in room.  

## 2013-07-24 NOTE — Progress Notes (Signed)
Case discussed with Dr. Schooler soon after the resident saw the patient.  We reviewed the resident's history and exam and pertinent patient test results.  I agree with the assessment, diagnosis, and plan of care documented in the resident's note. 

## 2013-07-24 NOTE — Discharge Summary (Signed)
Name: Shirley [name] MRN: [id number] DOB: 1975-07-16 38 y.o. PCP: Jeralene Huff, MD  Date of Admission: 07/22/2013  4:31 AM Date of Discharge: 07/25/2013 Attending Physician: Axel Filler, MD  Discharge Diagnosis: Principal Problem:   Atypical chest pain- likely Cocaine induced Active Problems:   Poorly controlled type 2 diabetes mellitus   SUBSTANCE ABUSE, MULTIPLE   HYPERTENSION   CORONARY ARTERY DISEASE, S/P PTCA   Trichomoniasis s/p treamtment  Discharge Medications:   Medication List    STOP taking these medications       amitriptyline 25 MG tablet  Commonly known as:  ELAVIL     cephALEXin 250 MG/5ML suspension  Commonly known as:  KEFLEX     HYDROcodone-acetaminophen 5-325 MG per tablet  Commonly known as:  NORCO/VICODIN      TAKE these medications       albuterol 108 (90 BASE) MCG/ACT inhaler  Commonly known as:  PROVENTIL HFA;VENTOLIN HFA  Inhale 2 puffs into the lungs every 4 (four) hours as needed for wheezing or shortness of breath.     amLODipine 10 MG tablet  Commonly known as:  NORVASC  Take 1 tablet (10 mg total) by mouth daily.     aspirin 81 MG EC tablet  Take 1 tablet (81 mg total) by mouth daily.  Start taking on:  07/26/2013     atorvastatin 80 MG tablet  Commonly known as:  LIPITOR  Take 1 tablet (80 mg total) by mouth daily at 6 PM.     calcium carbonate 500 MG chewable tablet  Commonly known as:  TUMS - dosed in mg elemental calcium  Chew 2 tablets by mouth daily.     ferrous sulfate 325 (65 FE) MG tablet  Take 1 tablet (325 mg total) by mouth daily.     Fluticasone-Salmeterol 100-50 MCG/DOSE Aepb  Commonly known as:  ADVAIR  Inhale 1 puff into the lungs every 12 (twelve) hours.     gabapentin 300 MG capsule  Commonly known as:  NEURONTIN  Take 300 mg by mouth 3 (three) times daily.     insulin aspart 100 UNIT/ML injection  Commonly known as:  novoLOG  Inject 2-12 Units into the skin 3 (three) times daily as  needed for high blood sugar. Sliding scale- If sugar 150-199: inject 2 unit;  200-249: 4 units, 250-299: 7 units; 300-349: 10 units; Over 350: 12 units     insulin glargine 100 UNIT/ML injection  Commonly known as:  LANTUS  Inject 50 Units into the skin at bedtime.     ketoconazole 2 % cream  Commonly known as:  NIZORAL  Apply 1 application topically daily.     LORazepam 1 MG tablet  Commonly known as:  ATIVAN  Take 1 tablet (1 mg total) by mouth 3 (three) times daily as needed for anxiety.     omeprazole 40 MG capsule  Commonly known as:  PRILOSEC  Take 1 capsule (40 mg total) by mouth daily.     ondansetron 4 MG disintegrating tablet  Commonly known as:  ZOFRAN-ODT  Take 4 mg by mouth every 4 (four) hours as needed for nausea or vomiting.     polyethylene glycol powder powder  Commonly known as:  GLYCOLAX/MIRALAX  Take 17 g by mouth daily.     pregabalin 75 MG capsule  Commonly known as:  LYRICA  Take 1 capsule (75 mg total) by mouth 2 (two) times daily.     senna-docusate 8.6-50 MG per tablet  Commonly known as:  Senokot-S  Take 2 tablets by mouth at bedtime.        Disposition and follow-up:   Shirley [name] was discharged from [facility] in Stable condition.  At the hospital follow up visit please address:  1.  Patient needs to follow up with OBGYN for heavy menstrual bleeding. I gave her the number, but please make sure she has scheduled an appointment. If not, please refer.  2. Patient also needs to follow up with GI for chronic abdominal pain- she has an appointment for 4/6  3. Iron deficiency anemia: make sure she is taking her iron supplement 4. She should NOT be taking her Elavil anymore 5. Cardiology requested patient be scheduled for outpatient functional cardiac study to risk stratify (it was a holiday so this was not arranged)- please refer to cardiology   2.  Labs / imaging needed at time of follow-up: CBC within a few weeks, she  will need CMP and lipids checked in 6 weeks per cardiology  3.  Pending labs/ test needing follow-up: anemia panel still pending  Follow-up Appointments: Follow-up Information   Follow up with Beryle Beams, MD On 07/28/2013. (2:30pm DO NOT MISS THIS APPOINTMENT)    Specialty:  Gastroenterology   Contact information:   [address] [place] [address] [phone]       Follow up with Dorian Heckle, MD. Schedule an appointment as soon as possible for a visit in 1 week.   Specialty:  Internal Medicine   Contact information:   [place] [address] [phone]       Follow up with [facility]. Schedule an appointment as soon as possible for a visit in 1 week.   Contact information:   [place] [address] [phone]      Follow up with Kirk Ruths, MD. Schedule an appointment as soon as possible for a visit in 1 week.   Specialty:  Cardiology   Contact information:   [address] [place] [address] [phone]       Discharge Instructions: Discharge Orders   Future Orders Complete By Expires   ([place]) Call MD:  Anytime you have any of the following symptoms: 1) 3 pound weight gain in 24 hours or 5 pounds in 1 week 2) shortness of breath, with or without a dry hacking cough 3) swelling in the hands, feet or stomach 4) if you have to sleep on extra pillows at night in order to breathe.  As directed    Call MD for:  persistant nausea and vomiting  As directed    Call MD for:  severe uncontrolled pain  As directed    Call MD for:  temperature >100.4  As directed    Diet - low sodium heart healthy  As directed    Increase activity slowly  As directed       Consultations: Treatment Team:  Rounding Lbcardiology, MDcardiology  Procedures Performed:  Dg Chest 2 View  07/22/2013   CLINICAL DATA:  Chest pain, shortness of breath.  EXAM: CHEST  2 VIEW  COMPARISON:  DG  ABD ACUTE W/CHEST dated 06/19/2012  FINDINGS: Cardiomediastinal silhouette is unremarkable. Mild central pulmonary vasculature congestion is low inspiratory examination. The lungs are clear without pleural effusions or focal consolidations. Trachea projects midline and there is no pneumothorax. Soft tissue planes and included osseous structures are non-suspicious.  IMPRESSION: Mild central pulmonary  vasculature congestion.  .   Electronically Signed   By: Elon Alas   On: 07/22/2013 05:09    Admission HPI:  The patient is a 38 yo woman, history of HTN, DM2, asthma, chronic HBV, prior CVA, presenting with chest pain. On the evening prior to admission, the patient used cocaine at around 11pm. At around 2pm, the patient experienced acute onset of mid-sternal non-radiating chest "tightness" while sitting, associated with shortness of breath and diaphoresis, though without nausea/vomiting. The patient was not changed by exertion, rest, nitro, or food. The pain was not worsened by lying flat, but felt minimally better when leaning forward. She notes no cough, sore throat, rhinorrhea, fever, orthopnea, PND, or LE edema. 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 She does not take aspirin, and has no first degree relatives with prior MI.  Hospital Course by problem list:  Orthostatic Hypotension and tachycardia in setting of likely iron deficiency anemia: Patient was initially admitted for chest pain, though was noted to be overtly orthostatic with sinus tachycardia on day after admission. Patient was given total of 2L NS boluses as well as NS at 125cc/hr x 48 hours. After IVF, her orthostatic hypotension resolved and on day of discharge she was no longer orthostatic. Patient had been taking Elavil prior to admission, which can cause hypotension so this was discontinued  during her stay and at discharge.  Patient has hx of poorly controlled DM, so autonomic dysfunction may be playing a role. UA did not show signs of infection. No other suspected sources of infection. Despite resolution of orthostatic hypotension, patient remained in and out of sinus tachycardia. She was monitored on telemetry during her stay, there were no arrhythmias other than sinus tach noted. Patient was likely hypovolemic upon admission given severe glucosuria with osmotic diuresis given her response to fluids. Patient was on her menstrual period during her admission and she noted that her periods have become heavier over the last few months and she is starting to pass larger clots. Hb dropped from approx 10-->8 during her admission, MCV 65. At time of discharge, her Hb had been stable hovering around 8 for three readings. Anemia panel was sent to confirm iron deficiency anemia, though she was presumptively started on ferrous sulfate given she has hx of prior iron deficiency. I suspect that her continued tachycardia and subjective lightheadedness is 2/2 her anemia. She will need to follow up with OBGYN. Patient was evaluated by cardiology on afternoon of discharge and she was deemed stable to follow up as outpatient.   # Atypical chest pain - Patient initially presented with with somewhat atypical sounding chest pain with associated SOB after recent cocaine use. Given her prior hx of CAD with complete occlusion of PDA in 2008 s/p PCA she was admitted for ACS rule out. Of note, patient did have follow up heart cath in 2010, which showed no residual CAD. UDS positive for cocaine. Troponins negative x 3, so ACS ruled out. CXR showed mild central pulmonary congestion, but proBNP not elevated, so CHF unlikely. Recent A1C = 9.6, LDL = 108 (07/21/13). Statin and ASA were initiated since patient not on these medications. DDX cocaine induced vasospasm vs acute bronchitis/viral URI causing muscle strain given patient  with mild cough. Pt with mild leukocytosis that down trended during her hospitalization, though PNA unlikely given no sign of  PNA on CXR. Patient had continued intermittent chest pain during her hospitalization. On morning of discharge, patient c/o episode of 10/10 chest pain. Troponin was negative and EKG showed sinus tachycardia with likely prior posterior infarct (old). Cardiology saw patient on afternoon of discharge and thought the chest pain sounded atypical and recommended outpatient functional study to be performed. Patient will also need lipids and LFTs checked in 6 weeks as she was discharged on atorvastatin. Also discharged on ASA 81mg  daily. NOTE: patient has hx of cocaine use so beta blockers should be avoided. Pain was treated with Vicodin prn. Patient requested Vicodin or percocet at discharge, but given patient was cocaine positive and it was brought to my attention by nursing staff that someone in the patient's room had been using marijuana two times during her hospital stay, I did not feel comfortable discharging patient with a narcotic prescription.   # Uncontrolled DM2 - The patient has a history of DM2, with last A1C = 9.6 (3/30). The patient has a history of medication non-compliance. She has been prescribed Lantus 50 and SSI, though unclear of what she takes at home. She was managed on Lantus 30 and SSI, given unknown actual insulin requirement. CBGs 213-277 overnight. Given CBGs were still relatively uncontrolled even when patient was eating hospital food, her home regimen was restarted on discharge. We continued gabapentin during her hospitalization.  # Chronic elevated AG -  AG = 18 on admission remained elevated for the next BMP. However, this resolved during her admission and Ag was 12 on discharge. Bicarb wnl. Lactic acid wnl. Renal function wnl. Unclear etiology. Per chart review, this is not atypical for her (last 3 anion gaps 18, 15, and 18 prior to her admission).   #  Trichomoniasis - seen on UA 05/20/13. The patient was prescribed treatment, but never took this medication. S/p Flagyl 2 g one dose 3/31. I discussed with her the need to have all of her partners treated. She voiced understanding.  # Asthma - Chronic, stable Continued dulera.  # HTN -Blood pressure remained stable during her admission. Home amlodipine was continued.   Discharge Vitals:   BP 139/92  Pulse 76  Temp(Src) 98 F (36.7 C) (Oral)  Resp 17  Ht 5\' 7"  (1.702 m)  Wt 97.9 kg (215 lb 13.3 oz)  BMI 33.80 kg/m2  SpO2 96%  LMP 06/22/2013  Discharge Labs:  Results for orders placed during the hospital encounter of 07/22/13 (from the past 24 hour(s))  GLUCOSE, CAPILLARY     Status: Abnormal   Collection Time    07/24/13  4:26 PM      Result Value Ref Range   Glucose-Capillary 277 (*) 70 - 99 mg/dL   Comment 1 Notify RN     Comment 2 Documented in Chart    GLUCOSE, CAPILLARY     Status: Abnormal   Collection Time    07/24/13  8:43 PM      Result Value Ref Range   Glucose-Capillary 221 (*) 70 - 99 mg/dL   Comment 1 Documented in Chart     Comment 2 Notify RN    CBC     Status: Abnormal   Collection Time    07/25/13  3:20 AM      Result Value Ref Range   WBC 12.5 (*) 4.0 - 10.5 K/uL   RBC 3.84 (*) 3.87 - 5.11 MIL/uL   Hemoglobin 8.1 (*) 12.0 - 15.0 g/dL   HCT 25.1 (*) 36.0 - 46.0 %  MCV 65.4 (*) 78.0 - 100.0 fL   MCH 21.1 (*) 26.0 - 34.0 pg   MCHC 32.3  30.0 - 36.0 g/dL   RDW 18.3 (*) 11.5 - 15.5 %   Platelets 515 (*) 150 - 400 K/uL  GLUCOSE, CAPILLARY     Status: Abnormal   Collection Time    07/25/13  6:17 AM      Result Value Ref Range   Glucose-Capillary 213 (*) 70 - 99 mg/dL   Comment 1 Documented in Chart     Comment 2 Notify RN    GLUCOSE, CAPILLARY     Status: Abnormal   Collection Time    07/25/13 11:24 AM      Result Value Ref Range   Glucose-Capillary 176 (*) 70 - 99 mg/dL  CBC WITH DIFFERENTIAL     Status: Abnormal   Collection Time    07/25/13  11:50 AM      Result Value Ref Range   WBC 10.9 (*) 4.0 - 10.5 K/uL   RBC 3.84 (*) 3.87 - 5.11 MIL/uL   Hemoglobin 8.1 (*) 12.0 - 15.0 g/dL   HCT 25.1 (*) 36.0 - 46.0 %   MCV 65.4 (*) 78.0 - 100.0 fL   MCH 21.1 (*) 26.0 - 34.0 pg   MCHC 32.3  30.0 - 36.0 g/dL   RDW 18.3 (*) 11.5 - 15.5 %   Platelets 492 (*) 150 - 400 K/uL   Neutrophils Relative % 60  43 - 77 %   Lymphocytes Relative 28  12 - 46 %   Monocytes Relative 7  3 - 12 %   Eosinophils Relative 5  0 - 5 %   Basophils Relative 0  0 - 1 %   Neutro Abs 6.5  1.7 - 7.7 K/uL   Lymphs Abs 3.1  0.7 - 4.0 K/uL   Monocytes Absolute 0.8  0.1 - 1.0 K/uL   Eosinophils Absolute 0.5  0.0 - 0.7 K/uL   Basophils Absolute 0.0  0.0 - 0.1 K/uL   RBC Morphology POLYCHROMASIA PRESENT    RETICULOCYTES     Status: Abnormal   Collection Time    07/25/13 11:50 AM      Result Value Ref Range   Retic Ct Pct 1.3  0.4 - 3.1 %   RBC. 3.84 (*) 3.87 - 5.11 MIL/uL   Retic Count, Manual 49.9  19.0 - 186.0 K/uL  TROPONIN I     Status: None   Collection Time    07/25/13 11:50 AM      Result Value Ref Range   Troponin I <0.30  <0.30 ng/mL    Signed: Rebecca Eaton, MD 07/25/2013, 3:44 PM   Time Spent on Discharge: 35 minutes Services Ordered on Discharge: none Equipment Ordered on Discharge: none

## 2013-07-24 NOTE — Progress Notes (Signed)
Inpatient Diabetes Program Recommendations  AACE/ADA: New Consensus Statement on Inpatient Glycemic Control (2013)  Target Ranges:  Prepandial:   less than 140 mg/dL      Peak postprandial:   less than 180 mg/dL (1-2 hours)      Critically ill patients:  140 - 180 mg/dL   Results for Shirley [name], Shirley [name] (MRN [id number]) as of 07/24/2013 09:12  Ref. Range 07/23/2013 07:50 07/23/2013 12:40 07/23/2013 17:13 07/23/2013 19:22 07/23/2013 20:48 07/24/2013 07:59  Glucose-Capillary Latest Range: 70-99 mg/dL 98 192 (H) 226 (H) 235 (H) 174 (H) 194 (H)   Diabetes history: DM Outpatient Diabetes medications: Lantus 50 units QHS, Novolog 2-12 units TID with meals Current orders for Inpatient glycemic control: Lantus 30 units QHS, Novolog 0-15 units  Inpatient Diabetes Program Recommendations Insulin - Basal: Please consider increasing Lantus to 33 units QHS. Insulin - Meal Coverage: Please consider ordering Novolog 3 units TID with meals for meal coverage.  Thanks, Barnie Alderman, RN, MSN, CCRN Diabetes Coordinator Inpatient Diabetes Program [phone] (Team Pager) [phone] ([facility] office) [phone] [facility] office)

## 2013-07-24 NOTE — Progress Notes (Signed)
Visit to patient while in hospital on 07/22/13. Will call after patient is discharged to assist with transition of care.

## 2013-07-24 NOTE — Progress Notes (Signed)
Internal Medicine Attending  Date: 07/24/2013  Patient name: Shirley [name] Medical record number: [id number] Date of birth: Apr 20, 1976 Age: 38 y.o. Gender: female  I saw and evaluated the patient, and discussed her care on A.M rounds with housestaff.  I reviewed the resident's note by Dr. Mechele Claude and I agree with the resident's findings and plans as documented in her note.

## 2013-07-24 NOTE — Progress Notes (Signed)
Subjective: Patient c/o having continued lightheadedness w/ standing and moving around. She was orthostatic yesterday and IVF were given, she is unsure if this helped her dizziness at all. Patient had two brief episodes of chest tightness last night each lasted approx 10 minutes and occurred at rest and resolved spontaneously. She denies having any SOB. Patient notes she is currently on her menstrual period and has hx of heavy menstrual bleeding, currently wearing three pads.   Objective: Vital signs in last 24 hours: Filed Vitals:   07/24/13 0756 07/24/13 0759 07/24/13 0800 07/24/13 0906  BP: 123/74 123/74 121/79 125/82  Pulse: 94 110 101   Temp:      TempSrc:      Resp:   14   Height:      Weight:      SpO2:   99%    Weight change: 0 kg (0 lb)  Intake/Output Summary (Last 24 hours) at 07/24/13 1133 Last data filed at 07/24/13 0900  Gross per 24 hour  Intake    615 ml  Output    600 ml  Net     15 ml   Pt borderline orthostatic only per HR on my exam this AM  Physical Exam General: alert, cooperative, NAD HEENT: vision grossly intact Neck: supple Lungs: clear to ascultation bilaterally, normal work of respiration, no wheezes, rales, ronchi Heart: tachycardic to 114, regular rhythm, no murmurs, gallops, or rubs Abdomen: soft, mildly tender in LLQ, normal bowel sounds Extremities: no BLE, warm Neurologic: alert & oriented X3, cranial nerves II-XII grossly intact, strength grossly intact, sensation intact to light touch  Lab Results: Basic Metabolic Panel:  Recent Labs Lab 07/22/13 0440  NA 138  K 3.7  CL 99  CO2 21  GLUCOSE 220*  BUN 12  CREATININE 0.87  CALCIUM 9.6   CBC:  Recent Labs Lab 07/22/13 0440  WBC 12.4*  NEUTROABS 8.0*  HGB 10.2*  HCT 30.7*  MCV 64.5*  PLT 594*   Cardiac Enzymes:  Recent Labs Lab 07/22/13 1032 07/22/13 1550 07/22/13 2200  TROPONINI <0.30 <0.30 <0.30   BNP:  Recent Labs Lab 07/22/13 0440  PROBNP 224.5*    CBG:  Recent Labs Lab 07/23/13 0750 07/23/13 1240 07/23/13 1713 07/23/13 1922 07/23/13 2048 07/24/13 0759  GLUCAP 98 192* 226* 235* 174* 194*   Hemoglobin A1C:  Recent Labs Lab 07/21/13 1352  HGBA1C 9.6   Fasting Lipid Panel:  Recent Labs Lab 07/21/13 1457  CHOL 169  HDL 41  LDLCALC 108*  TRIG 100  CHOLHDL 4.1   Urine Drug Screen: Drugs of Abuse     Component Value Date/Time   LABOPIA NONE DETECTED 07/22/2013 0548   LABOPIA NEGATIVE 02/21/2010 2201   COCAINSCRNUR POSITIVE* 07/22/2013 0548   COCAINSCRNUR NEGATIVE 02/21/2010 2201   LABBENZ NONE DETECTED 07/22/2013 0548   LABBENZ NEGATIVE 02/21/2010 2201   AMPHETMU NONE DETECTED 07/22/2013 0548   AMPHETMU NEGATIVE 02/21/2010 2201   THCU NONE DETECTED 07/22/2013 0548   LABBARB NONE DETECTED 07/22/2013 0548     Micro Results: Recent Results (from the past 240 hour(s))  MRSA PCR SCREENING     Status: None   Collection Time    07/22/13  2:47 PM      Result Value Ref Range Status   MRSA by PCR NEGATIVE  NEGATIVE Final   Comment:            The GeneXpert MRSA Assay (FDA     approved for NASAL specimens  only), is one component of a     comprehensive MRSA colonization     surveillance program. It is not     intended to diagnose MRSA     infection nor to guide or     monitor treatment for     MRSA infections.   Studies/Results: No results found. Medications: I have reviewed the patient's current medications. Scheduled Meds: . amLODipine  10 mg Oral Daily  . aspirin EC  81 mg Oral Daily  . atorvastatin  80 mg Oral q1800  . feeding supplement (GLUCERNA SHAKE)  237 mL Oral Q1500  . gabapentin  300 mg Oral TID  . heparin  5,000 Units Subcutaneous 3 times per day  . insulin aspart  0-15 Units Subcutaneous TID WC  . insulin aspart  0-5 Units Subcutaneous QHS  . insulin glargine  30 Units Subcutaneous QHS  . mometasone-formoterol  2 puff Inhalation BID  . pantoprazole  40 mg Oral Daily  . sodium chloride   1,000 mL Intravenous Once   Continuous Infusions: . sodium chloride 999 mL (07/24/13 0438)   PRN Meds:.acetaminophen, acetaminophen, calcium carbonate, HYDROcodone-acetaminophen, ondansetron (ZOFRAN) IV, polyethylene glycol powder, senna-docusate  Assessment/Plan:  Orthostatic Hypotension and tachycardia: Patient was overtly orthostatic yesterday afternoon. Patient was given 1L NS bolus over 3 hours and started on NS at 125cc/hr overnight. She is borderline orthostatic this morning per HR only and does have lightheadedness with standing. Giving another 1L NS bolus and recheck orthostatic VS. Patient remains tachycardic, appears to be sinus tachycardia per telemetry. I suspect that patient may have been volume depleted upon admission as she has >1000 glucose in urine and likely has had a osmotic diuresis, which has been ongoing for a few weeks. Patient has hx of poorly controlled DM, so autonomic dysfunction may be playing a role. Additionally, patient is on Elavil, which can cause hypotension. Will discontinue Elavil. UA did not show signs of infection. No other suspected sources of infection at this time. Patient's Hb dropped from 10.2 to 8.4 since 3/31 (MCV 65). She c/o having heavy menstrual periods and she is currently on her period, which is likely the cause of her drop in Hb. Anemia likely playing a role in her lightheadedness as well.  -d/c elavil -IVF overnight -recheck orthostatics in AM -transfer to telemetry unit  # Atypical Chest pain - Likely induced by cocaine. Improving. ACS ruled out.  -aspirin, atorvastatin  -avoid BB, given cocaine use  -vicodin prn for pain -consider outpatient stress test   # Uncontrolled DM2 - The patient has a history of poorly controlled DM2, with last A1C = 9.6 (3/30). The patient has a history of medication non-compliance. She has been prescribed Lantus 50 and SSI, though unclear of what she takes at home.  - Lantus 30 and SSI, given unknown actual  insulin requirement - CBGs stable overnight -continue gabapentin   # Chronic elevated AG - resolved. AG today is 12.  # Trichomoniasis - seen on UA 05/20/13. The patient was prescribed treatment, but never took this medication.  -s/p Flagyl 2 g one dose 3/31 -I discussed with patient the need to have all partners treated, she understands.  # Asthma - chronic, stable  -continue dulera   # HTN - chronic, stable  -continue amlodipine   # PPX - heparin  Dispo: Disposition is deferred at this time, awaiting improvement of current medical problems.  Anticipated discharge in approximately 1 day(s).   The patient does have a current PCP Mayra Reel  Beverly Sessions, MD) and does need an [facility] hospital follow-up appointment after discharge.  The patient does not have transportation limitations that hinder transportation to clinic appointments.  .Services Needed at time of discharge: Y = Yes, Blank = No PT:   OT:   RN:   Equipment:   Other:     LOS: 2 days   Rebecca Eaton, MD 07/24/2013, 11:33 AM

## 2013-07-24 NOTE — Progress Notes (Addendum)
Transferred patient to 2W per wheelchair, alert and oriented.  Report given to 2W Nurse.

## 2013-07-24 NOTE — Progress Notes (Signed)
Urine sample obtained and sent to lab. Pt currently menstruating.

## 2013-07-25 ENCOUNTER — Other Ambulatory Visit: Payer: Medicaid Other

## 2013-07-25 ENCOUNTER — Encounter (HOSPITAL_COMMUNITY): Payer: Self-pay | Admitting: Cardiology

## 2013-07-25 DIAGNOSIS — D649 Anemia, unspecified: Secondary | ICD-10-CM

## 2013-07-25 LAB — RETICULOCYTES
RBC.: 3.84 MIL/uL — ABNORMAL LOW (ref 3.87–5.11)
Retic Count, Absolute: 49.9 10*3/uL (ref 19.0–186.0)
Retic Ct Pct: 1.3 % (ref 0.4–3.1)

## 2013-07-25 LAB — IRON AND TIBC
Iron: 10 ug/dL — ABNORMAL LOW (ref 42–135)
SATURATION RATIOS: 3 % — AB (ref 20–55)
TIBC: 327 ug/dL (ref 250–470)
UIBC: 317 ug/dL (ref 125–400)

## 2013-07-25 LAB — URINE CULTURE

## 2013-07-25 LAB — CBC WITH DIFFERENTIAL/PLATELET
Basophils Absolute: 0 10*3/uL (ref 0.0–0.1)
Basophils Relative: 0 % (ref 0–1)
EOS ABS: 0.5 10*3/uL (ref 0.0–0.7)
Eosinophils Relative: 5 % (ref 0–5)
HCT: 25.1 % — ABNORMAL LOW (ref 36.0–46.0)
Hemoglobin: 8.1 g/dL — ABNORMAL LOW (ref 12.0–15.0)
LYMPHS PCT: 28 % (ref 12–46)
Lymphs Abs: 3.1 10*3/uL (ref 0.7–4.0)
MCH: 21.1 pg — AB (ref 26.0–34.0)
MCHC: 32.3 g/dL (ref 30.0–36.0)
MCV: 65.4 fL — ABNORMAL LOW (ref 78.0–100.0)
MONO ABS: 0.8 10*3/uL (ref 0.1–1.0)
Monocytes Relative: 7 % (ref 3–12)
Neutro Abs: 6.5 10*3/uL (ref 1.7–7.7)
Neutrophils Relative %: 60 % (ref 43–77)
PLATELETS: 492 10*3/uL — AB (ref 150–400)
RBC: 3.84 MIL/uL — ABNORMAL LOW (ref 3.87–5.11)
RDW: 18.3 % — AB (ref 11.5–15.5)
WBC: 10.9 10*3/uL — ABNORMAL HIGH (ref 4.0–10.5)

## 2013-07-25 LAB — CBC
HEMATOCRIT: 25.1 % — AB (ref 36.0–46.0)
HEMOGLOBIN: 8.1 g/dL — AB (ref 12.0–15.0)
MCH: 21.1 pg — ABNORMAL LOW (ref 26.0–34.0)
MCHC: 32.3 g/dL (ref 30.0–36.0)
MCV: 65.4 fL — ABNORMAL LOW (ref 78.0–100.0)
Platelets: 515 10*3/uL — ABNORMAL HIGH (ref 150–400)
RBC: 3.84 MIL/uL — AB (ref 3.87–5.11)
RDW: 18.3 % — ABNORMAL HIGH (ref 11.5–15.5)
WBC: 12.5 10*3/uL — ABNORMAL HIGH (ref 4.0–10.5)

## 2013-07-25 LAB — FOLATE: FOLATE: 6.5 ng/mL

## 2013-07-25 LAB — VITAMIN B12: Vitamin B-12: 348 pg/mL (ref 211–911)

## 2013-07-25 LAB — FERRITIN: Ferritin: 7 ng/mL — ABNORMAL LOW (ref 10–291)

## 2013-07-25 LAB — TROPONIN I: Troponin I: <0.3 ng/mL (ref <0.30)

## 2013-07-25 LAB — GLUCOSE, CAPILLARY
GLUCOSE-CAPILLARY: 176 mg/dL — AB (ref 70–99)
Glucose-Capillary: 213 mg/dL — ABNORMAL HIGH (ref 70–99)

## 2013-07-25 MED ORDER — FERROUS SULFATE 325 (65 FE) MG PO TABS: 325.0000 mg | ORAL_TABLET | Freq: Every day | ORAL | Status: DC

## 2013-07-25 MED ORDER — FERROUS SULFATE 325 (65 FE) MG PO TABS
325.0000 mg | ORAL_TABLET | Freq: Every day | ORAL | Status: DC
  Filled 2013-07-25: qty 1

## 2013-07-25 MED ORDER — ASPIRIN 81 MG PO TBEC: 81.0000 mg | DELAYED_RELEASE_TABLET | Freq: Every day | ORAL | Status: DC

## 2013-07-25 MED ORDER — ATORVASTATIN CALCIUM 80 MG PO TABS: 80.0000 mg | ORAL_TABLET | Freq: Every day | ORAL | Status: DC

## 2013-07-25 NOTE — Progress Notes (Signed)
Patient c/o Chest Pain at 2325 PM, Vital sign obtained see flow sheet,  EKG obtained and rhythm show Sinus Tach , HR 110. Nitrostat 0.4mg  SL given, Patient verbalized relief after the 1st dose. will continue to monitor

## 2013-07-25 NOTE — Progress Notes (Addendum)
Pts HR went to 170 for about 1 minute; pt is not symptomatic; notified MD; MD gave no further orders; pt will follow up with cardiology out patient; DC orders per MD.  Carollee Sires, RN

## 2013-07-25 NOTE — Discharge Instructions (Signed)
YOU HAVE AN APPOINTMENT WITH GI (DR. HUNG) ON Monday, April 6 AT 2:30PM. DO NOT MISS THIS APPOINTMENT.  PLEASE CALL THE INTERNAL MEDICINE CLINIC TO SCHEDULE A FOLLOW UP APPOINTMENT WITH YOUR PCP NEXT WEEK.  WE ADDED 3 MEDICATIONS TO YOUR REGIMEN: ASPIRIN 81MG  DAILY (FOR YOUR HEART AND BLOOD VESSELS) AND ATORVASTATIN 80MG  DAILY (THIS IS FOR YOUR CHOLESTEROL) AND FERROUS SULFATE (IRON SUPPLEMENTATION) 325MG  DAILY.  Nonspecific Tachycardia Tachycardia is a faster than normal heartbeat (more than 100 beats per minute). In adults, the heart normally beats between 60 and 100 times a minute. A fast heartbeat may be a normal response to exercise or stress. It does not necessarily mean that something is wrong. However, sometimes when your heart beats too fast it may not be able to pump enough blood to the rest of your body. This can result in chest pain, shortness of breath, dizziness, and even fainting. Nonspecific tachycardia means that the specific cause or pattern of your tachycardia is unknown. CAUSES  Tachycardia may be harmless or it may be due to a more serious underlying cause. Possible causes of tachycardia include:  Exercise or exertion.  Fever.  Pain or injury.  Infection.  Loss of body fluids (dehydration).  Overactive thyroid.  Lack of red blood cells (anemia).  Anxiety and stress.  Alcohol.  Caffeine.  Tobacco products.  Diet pills.  Illegal drugs.  Heart disease. SYMPTOMS  Rapid or irregular heartbeat (palpitations).  Suddenly feeling your heart beating (cardiac awareness).  Dizziness.  Tiredness (fatigue).  Shortness of breath.  Chest pain.  Nausea.  Fainting. DIAGNOSIS  Your caregiver will perform a physical exam and take your medical history. In some cases, a heart specialist (cardiologist) may be consulted. Your caregiver may also order:  Blood tests.  Electrocardiography. This test records the electrical activity of your heart.  A heart  monitoring test. TREATMENT  Treatment will depend on the likely cause of your tachycardia. The goal is to treat the underlying cause of your tachycardia. Treatment methods may include:  Replacement of fluids or blood through an intravenous (IV) tube for moderate to severe dehydration or anemia.  New medicines or changes in your current medicines.  Diet and lifestyle changes.  Treatment for certain infections.  Stress relief or relaxation methods. HOME CARE INSTRUCTIONS   Rest.  Drink enough fluids to keep your urine clear or pale yellow.  Do not smoke.  Avoid:  Caffeine.  Tobacco.  Alcohol.  Chocolate.  Stimulants such as over-the-counter diet pills or pills that help you stay awake.  Situations that cause anxiety or stress.  Illegal drugs such as marijuana, phencyclidine (PCP), and cocaine.  Only take medicine as directed by your caregiver.  Keep all follow-up appointments as directed by your caregiver. SEEK IMMEDIATE MEDICAL CARE IF:   You have pain in your chest, upper arms, jaw, or neck.  You become weak, dizzy, or feel faint.  You have palpitations that will not go away.  You vomit, have diarrhea, or pass blood in your stool.  Your skin is cool, pale, and wet.  You have a fever that will not go away with rest, fluids, and medicine. MAKE SURE YOU:   Understand these instructions.  Will watch your condition.  Will get help right away if you are not doing well or get worse. Document Released: 05/18/2004 Document Revised: 07/03/2011 Document Reviewed: 03/21/2011 [facility] Patient Information 2014 [place], [place].

## 2013-07-25 NOTE — Progress Notes (Addendum)
Subjective: Patient still having some lightheadedness with standing. She notes she is passing bigger clots during this menstrual period than prior. She has not seen an OBGYN in "years". Patient had one episode of the same chest pain she has been having that woke her up from sleep early this morning. She was given Norco, which improved the pain, though the pain is still present. Patient notes significant anxiety during the episodes of chest pain and she thinks her anxiety is making her have some SOB. Telemetry shows sinus tachycardia.   Objective: Vital signs in last 24 hours: Filed Vitals:   07/24/13 1947 07/24/13 2016 07/24/13 2332 07/25/13 0434  BP:  125/66 130/86 124/69  Pulse:  101 98 105  Temp:  98.6 F (37 C)  98.2 F (36.8 C)  TempSrc:  Oral  Oral  Resp:  18 18 18   Height:      Weight:      SpO2: 99% 99% 100% 100%   Weight change:   Intake/Output Summary (Last 24 hours) at 07/25/13 0724 Last data filed at 07/24/13 1428  Gross per 24 hour  Intake   1105 ml  Output    850 ml  Net    255 ml   Patient not orthostatic this morning.  Physical Exam General: alert, cooperative, NAD; resting comfortably in bed HEENT: vision grossly intact Neck: supple Lungs: clear to ascultation bilaterally, normal work of respiration, no wheezes, rales, ronchi Heart: tachycardic, regular rhythm, no murmurs, gallops, or rubs Abdomen: soft, mildly tender in LLQ, normal bowel sounds Extremities: no BLE, warm Neurologic: alert & oriented X3, cranial nerves II-XII grossly intact, strength grossly intact, sensation intact to light touch  Lab Results: Basic Metabolic Panel:  Recent Labs Lab 07/22/13 0440 07/24/13 1135  NA 138 138  K 3.7 4.0  CL 99 106  CO2 21 20  GLUCOSE 220* 192*  BUN 12 10  CREATININE 0.87 0.70  CALCIUM 9.6 8.2*   CBC:  Recent Labs Lab 07/22/13 0440 07/24/13 1135 07/25/13 0320  WBC 12.4* 10.9* 12.5*  NEUTROABS 8.0*  --   --   HGB 10.2* 8.4* 8.1*  HCT  30.7* 26.3* 25.1*  MCV 64.5* 65.4* 65.4*  PLT 594* 501* 515*   Cardiac Enzymes:  Recent Labs Lab 07/22/13 1032 07/22/13 1550 07/22/13 2200  TROPONINI <0.30 <0.30 <0.30   BNP:  Recent Labs Lab 07/22/13 0440  PROBNP 224.5*   CBG:  Recent Labs Lab 07/23/13 2048 07/24/13 0759 07/24/13 1233 07/24/13 1626 07/24/13 2043 07/25/13 0617  GLUCAP 174* 194* 187* 277* 221* 213*   Hemoglobin A1C:  Recent Labs Lab 07/21/13 1352  HGBA1C 9.6   Fasting Lipid Panel:  Recent Labs Lab 07/21/13 1457  CHOL 169  HDL 41  LDLCALC 108*  TRIG 100  CHOLHDL 4.1   Urine Drug Screen: Drugs of Abuse     Component Value Date/Time   LABOPIA NONE DETECTED 07/22/2013 0548   LABOPIA NEGATIVE 02/21/2010 2201   COCAINSCRNUR POSITIVE* 07/22/2013 0548   COCAINSCRNUR NEGATIVE 02/21/2010 2201   LABBENZ NONE DETECTED 07/22/2013 0548   LABBENZ NEGATIVE 02/21/2010 2201   AMPHETMU NONE DETECTED 07/22/2013 0548   AMPHETMU NEGATIVE 02/21/2010 2201   THCU NONE DETECTED 07/22/2013 0548   LABBARB NONE DETECTED 07/22/2013 0548     Micro Results: Recent Results (from the past 240 hour(s))  MRSA PCR SCREENING     Status: None   Collection Time    07/22/13  2:47 PM      Result Value  Ref Range Status   MRSA by PCR NEGATIVE  NEGATIVE Final   Comment:            The GeneXpert MRSA Assay (FDA     approved for NASAL specimens     only), is one component of a     comprehensive MRSA colonization     surveillance program. It is not     intended to diagnose MRSA     infection nor to guide or     monitor treatment for     MRSA infections.  URINE CULTURE     Status: None   Collection Time    07/24/13  1:44 AM      Result Value Ref Range Status   Specimen Description URINE, CLEAN CATCH   Final   Special Requests NONE   Final   Culture  Setup Time     Final   Value: 07/24/2013 08:33     Performed at [place]     Final   Value: >=100,000 COLONIES/ML     Performed at [facility]   Culture     Final   Value: Multiple bacterial morphotypes present, none predominant. Suggest appropriate recollection if clinically indicated.     Performed at [facility]   Report Status 07/25/2013 FINAL   Final   Studies/Results: No results found. Medications: I have reviewed the patient's current medications. Scheduled Meds: . amLODipine  10 mg Oral Daily  . aspirin EC  81 mg Oral Daily  . atorvastatin  80 mg Oral q1800  . feeding supplement (GLUCERNA SHAKE)  237 mL Oral Q1500  . gabapentin  300 mg Oral TID  . heparin  5,000 Units Subcutaneous 3 times per day  . insulin aspart  0-15 Units Subcutaneous TID WC  . insulin aspart  0-5 Units Subcutaneous QHS  . insulin glargine  30 Units Subcutaneous QHS  . mometasone-formoterol  2 puff Inhalation BID  . pantoprazole  40 mg Oral Daily   Continuous Infusions: . sodium chloride 125 mL/hr at 07/25/13 0416   PRN Meds:.acetaminophen, acetaminophen, calcium carbonate, HYDROcodone-acetaminophen, ondansetron (ZOFRAN) IV, polyethylene glycol powder, senna-docusate  Assessment/Plan:  Orthostatic Hypotension and tachycardia in setting of anemia: Likely multifactorial, symptomatic anemia combined with hypovolemia 2/2 osmotic diuresis due to uncontrolled DM. Autonomic dysfunction 2/2 long hx of poorly controlled DM is also still a possibility. Tachycardia somewhat improved after IVF and she is not orthostatic today. Will continue to hold elavil at discharge. -d/c IVF -check CBC this afternoon to make sure Hb remains stable -anemia panel -f/u with PCP on Monday -start Fe supplementation if anemia panel shows overt iron deficiency  -will benefit from f/u with OBGYN to evaluate heavy menstrual periods  # Atypical Chest pain - Likely induced by cocaine, though patient with some residual chest pain which sounds atypical for cardiac etiology. Also may be musculoskeletal (from frequent cough) in origin, but remains unclear. ACS  ruled out on admission, but given her continued chest pain and sinus tachycardia we will obtain another EKG and cycle troponin. Will consult cardiology. -aspirin, atorvastatin added this admission- will continue at discharge -avoid BB, given cocaine use  -vicodin prn for pain -consider outpatient stress test   # Uncontrolled DM2 - The patient has a history of poorly controlled DM2, with last A1C = 9.6 (3/30). The patient has a history of medication non-compliance. She has been prescribed Lantus 50 and SSI, though unclear of what she takes at  home.  - Lantus 30 and SSI, given unknown actual insulin requirement - CBGs slightly elevated (200s) overnight -continue gabapentin   # Trichomoniasis - s/p Flagyl 2 g one dose 3/31 -I discussed with patient the need to have all partners treated, she understands.  # Asthma - chronic, stable  -continue dulera   # HTN - chronic, stable  -continue amlodipine   # PPX - heparin  Dispo: Disposition is deferred at this time, awaiting improvement of current medical problems.  Anticipated discharge in approximately 1 day(s). Perhaps discharge later today if Hb stable.   The patient does have a current PCP Mayra Reel Beverly Sessions, MD) and does need an [facility] hospital follow-up appointment after discharge.  The patient does not have transportation limitations that hinder transportation to clinic appointments.  .Services Needed at time of discharge: Y = Yes, Blank = No PT:   OT:   RN:   Equipment:   Other:     LOS: 3 days   Rebecca Eaton, MD 07/25/2013, 7:24 AM

## 2013-07-25 NOTE — Consult Note (Addendum)
HPI: 38 year old female with past medical history of substance abuse, coronary artery disease, diabetes mellitus, Hypertension, hyperlipidemia, fibromyalgia for evaluation of chest pain. Patient had PTCA of her PDA in 2008. Followup catheterization apparently showed no residual coronary disease. She has had intermittent chest pain since that time. She uses cocaine twice weekly by her report. She recently used and then developed chest pain and presented to the emergency room. She has ruled out and cardiology is asked to evaluate. Her chest pain is in the left upper chest area. It is described as a squeezing. It does not radiate. It occurs both with exertion and at rest. It lasts anywhere from 30 minutes to 3 days. There is no associated nausea but there has dyspnea. No diaphoresis. The pain can increase with inspiration.  Medications Prior to Admission  Medication Sig Dispense Refill  . amLODipine (NORVASC) 10 MG tablet Take 1 tablet (10 mg total) by mouth daily.  30 tablet  6  . calcium carbonate (TUMS - DOSED IN MG ELEMENTAL CALCIUM) 500 MG chewable tablet Chew 2 tablets by mouth daily.       . Fluticasone-Salmeterol (ADVAIR) 100-50 MCG/DOSE AEPB Inhale 1 puff into the lungs every 12 (twelve) hours.  60 each  6  . gabapentin (NEURONTIN) 300 MG capsule Take 300 mg by mouth 3 (three) times daily.      Marland Kitchen HYDROcodone-acetaminophen (NORCO/VICODIN) 5-325 MG per tablet Take 1-2 tablets by mouth every 6 (six) hours as needed.  13 tablet  0  . LORazepam (ATIVAN) 1 MG tablet Take 1 tablet (1 mg total) by mouth 3 (three) times daily as needed for anxiety.  15 tablet  0  . omeprazole (PRILOSEC) 40 MG capsule Take 1 capsule (40 mg total) by mouth daily.  31 capsule  6  . ondansetron (ZOFRAN-ODT) 4 MG disintegrating tablet Take 4 mg by mouth every 4 (four) hours as needed for nausea or vomiting.      . pregabalin (LYRICA) 75 MG capsule Take 1 capsule (75 mg total) by mouth 2 (two) times daily.  60 capsule  6    . [DISCONTINUED] amitriptyline (ELAVIL) 25 MG tablet Take 2 tablets (50 mg total) by mouth at bedtime.  60 tablet  3  . [DISCONTINUED] HYDROcodone-acetaminophen (NORCO/VICODIN) 5-325 MG per tablet Take 1-2 tablets by mouth every 4 (four) hours as needed for moderate pain.  7 tablet  0  . albuterol (PROVENTIL HFA;VENTOLIN HFA) 108 (90 BASE) MCG/ACT inhaler Inhale 2 puffs into the lungs every 4 (four) hours as needed for wheezing or shortness of breath.      . insulin aspart (NOVOLOG) 100 UNIT/ML injection Inject 2-12 Units into the skin 3 (three) times daily as needed for high blood sugar. Sliding scale- If sugar 150-199: inject 2 unit;  200-249: 4 units, 250-299: 7 units; 300-349: 10 units; Over 350: 12 units      . insulin glargine (LANTUS) 100 UNIT/ML injection Inject 50 Units into the skin at bedtime.      Marland Kitchen ketoconazole (NIZORAL) 2 % cream Apply 1 application topically daily.  15 g  0  . polyethylene glycol powder (GLYCOLAX/MIRALAX) powder Take 17 g by mouth daily.  255 g  0  . senna-docusate (SENOKOT-S) 8.6-50 MG per tablet Take 2 tablets by mouth at bedtime.  30 tablet  3  . [DISCONTINUED] cephALEXin (KEFLEX) 250 MG/5ML suspension Take 5 mLs (250 mg total) by mouth 3 (three) times daily.  100 mL  0  . [DISCONTINUED] metroNIDAZOLE (  FLAGYL) 500 MG tablet Take 4 tablets (2,000 mg total) by mouth once. May crush and take with soft food such as apple sauce.  4 tablet  0    Allergies  Allergen Reactions  . Lisinopril Nausea Only  . Morphine And Related Itching    Itching only, but tolerated Percocet.    Past Medical History  Diagnosis Date  . Thrombocytosis     Hem/Onc suggested 2/2 chronic hepatits and/or iron deficiency anemia  . Iron deficiency anemia   . N&V (nausea and vomiting)     Chronic. Unclear etiology with multiple admission and ED visits. CT abdomen with and without contrast (02/2011)  showed no acute process. Gastic Emptying scan (01/2010) was normal. Ultrasound of the  abdomen was within normal limits. Hepatitis B viral load was undectable. HIV NR. EGD - gastritis, Hpylori + s/p Rx  . Hypertension   . Hyperlipidemia   . Diabetes mellitus type 2, uncontrolled, with complications   . Recurrent boils   . Back pain   . OSA (obstructive sleep apnea)   . 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 Cath 12/10- no restenosis or significant CAD progression  . Irregular menses     Small ovarian follicles seen on PP(5/09)  . History of pyelonephritis     H/o GrpB Pyelonephritis (9/06) and UTI- 07/11- E.Coli, 12/10- GBS  . Abscess of tunica vaginalis     10/09- Abundant S. aureus- sensitive to all abx  . Obesity   . Asthma   . Gastritis   . Depression   . Hepatitis B, chronic     Hep BeAb+,Hep B cAb+ & Hep BsAg+ (9/06)  . Peripheral neuropathy   . Fibromyalgia   . GERD (gastroesophageal reflux disease)   . Migraine   . Anxiety   . Gastroparesis     secondary to poorly controlled DM, last emptying study performed 01/2010  was normal but may be falsely positive as pt was on reglan  . Blood dyscrasia   . H/O: CVA (cerebrovascular accident)     history of remote right cerebellar infarct noted on head CT at least since 10/2011  . Cancer     Past Surgical History  Procedure Laterality Date  . Cesarean section  1997    History   Social History  . Marital Status: Single    Spouse Name: N/A    Number of Children: 2  . Years of Education: 11   Occupational History  . other     unemployed   Social History Main Topics  . Smoking status: Former Smoker    Types: Cigarettes    Quit date: 04/24/1996  . Smokeless tobacco: Never Used     Comment: quit smoking cigarettes age 52  . Alcohol Use: Yes     Comment: occasionally  . Drug Use: Yes    Special: Marijuana     Comment: Cocaine  . Sexual Activity: Yes   Other Topics Concern  . 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   Also works at [employer] having to lift elderly individuals.          Family History  Problem Relation Age of Onset  . Diabetes Father     ROS:  Heavy periods but no fevers or chills,  productive cough, hemoptysis, dysphasia, odynophagia, melena, hematochezia, dysuria, hematuria, rash, seizure activity, orthopnea, PND, pedal edema, claudication. Remaining systems are negative.  Physical Exam:   Blood pressure 120/74, pulse 107, temperature 98 F (36.7 C), temperature source Oral, resp. rate 17, height 5' 7" (1.702 m), weight 215 lb 13.3 oz (97.9 kg), last menstrual period 06/22/2013, SpO2 100.00%.  General:  Well developed/well nourished in NAD Skin warm/dry Patient not depressed No peripheral clubbing Back-normal HEENT-normal/normal eyelids Neck supple/normal carotid upstroke bilaterally; no bruits; no JVD; no thyromegaly chest - CTA/ normal expansion CV - RRR/normal S1 and S2; no murmurs, rubs or gallops;  PMI nondisplaced Abdomen -NT/ND, no HSM, no mass, + bowel sounds, no bruit 2+ femoral pulses, no bruits Ext-no edema, chords, 2+ DP Neuro-grossly nonfocal  ECG Sinus rhythm with no ST changes. Cannot rule out prior inferior infarct.  Results for orders placed during the hospital encounter of 07/22/13 (from the past 48 hour(s))  GLUCOSE, CAPILLARY     Status: Abnormal   Collection Time    07/23/13  5:13 PM      Result Value Ref Range   Glucose-Capillary 226 (*) 70 - 99 mg/dL   Comment 1 Notify RN    GLUCOSE, CAPILLARY     Status: Abnormal   Collection Time    07/23/13  7:22 PM      Result Value Ref Range   Glucose-Capillary 235 (*) 70 - 99 mg/dL  GLUCOSE, CAPILLARY     Status: Abnormal   Collection Time    07/23/13  8:48 PM      Result Value Ref Range   Glucose-Capillary 174 (*) 70 - 99 mg/dL  URINALYSIS, ROUTINE W REFLEX MICROSCOPIC      Status: Abnormal   Collection Time    07/24/13  1:44 AM      Result Value Ref Range   Color, Urine RED (*) YELLOW   Comment: BIOCHEMICALS MAY BE AFFECTED BY COLOR   APPearance CLOUDY (*) CLEAR   Specific Gravity, Urine 1.022  1.005 - 1.030   pH 6.0  5.0 - 8.0   Glucose, UA >1000 (*) NEGATIVE mg/dL   Hgb urine dipstick LARGE (*) NEGATIVE   Bilirubin Urine NEGATIVE  NEGATIVE   Ketones, ur NEGATIVE  NEGATIVE mg/dL   Protein, ur NEGATIVE  NEGATIVE mg/dL   Urobilinogen, UA 1.0  0.0 - 1.0 mg/dL   Nitrite NEGATIVE  NEGATIVE   Leukocytes, UA SMALL (*) NEGATIVE  URINE CULTURE     Status: None   Collection Time    07/24/13  1:44 AM      Result Value Ref Range   Specimen Description URINE, CLEAN CATCH     Special Requests NONE     Culture  Setup Time       Value: 07/24/2013 08:33     Performed at [facility]       Value: >=100,000 COLONIES/ML     Performed at [facility]   Culture       Value: Multiple bacterial morphotypes present, none predominant. Suggest appropriate recollection if clinically indicated.     Performed at [facility]   Report Status 07/25/2013 FINAL    URINE MICROSCOPIC-ADD ON     Status: Abnormal   Collection Time    07/24/13  1:44 AM      Result Value Ref Range   Squamous Epithelial / LPF RARE  RARE   WBC, UA 3-6  <3 WBC/hpf   RBC /  HPF TOO NUMEROUS TO COUNT  <3 RBC/hpf   Bacteria, UA FEW (*) RARE  GLUCOSE, CAPILLARY     Status: Abnormal   Collection Time    07/24/13  7:59 AM      Result Value Ref Range   Glucose-Capillary 194 (*) 70 - 99 mg/dL   Comment 1 Notify RN    CBC     Status: Abnormal   Collection Time    07/24/13 11:35 AM      Result Value Ref Range   WBC 10.9 (*) 4.0 - 10.5 K/uL   RBC 4.02  3.87 - 5.11 MIL/uL   Hemoglobin 8.4 (*) 12.0 - 15.0 g/dL   HCT 26.3 (*) 36.0 - 46.0 %   MCV 65.4 (*) 78.0 - 100.0 fL   MCH 20.9 (*) 26.0 - 34.0 pg   MCHC 31.9  30.0 - 36.0 g/dL   RDW 18.2 (*) 11.5 - 15.5 %    Platelets 501 (*) 150 - 400 K/uL  BASIC METABOLIC PANEL     Status: Abnormal   Collection Time    07/24/13 11:35 AM      Result Value Ref Range   Sodium 138  137 - 147 mEq/L   Potassium 4.0  3.7 - 5.3 mEq/L   Chloride 106  96 - 112 mEq/L   CO2 20  19 - 32 mEq/L   Glucose, Bld 192 (*) 70 - 99 mg/dL   BUN 10  6 - 23 mg/dL   Creatinine, Ser 0.70  0.50 - 1.10 mg/dL   Calcium 8.2 (*) 8.4 - 10.5 mg/dL   GFR calc non Af Amer >90  >90 mL/min   GFR calc Af Amer >90  >90 mL/min   Comment: (NOTE)     The eGFR has been calculated using the CKD EPI equation.     This calculation has not been validated in all clinical situations.     eGFR's persistently <90 mL/min signify possible Chronic Kidney     Disease.  GLUCOSE, CAPILLARY     Status: Abnormal   Collection Time    07/24/13 12:33 PM      Result Value Ref Range   Glucose-Capillary 187 (*) 70 - 99 mg/dL  GLUCOSE, CAPILLARY     Status: Abnormal   Collection Time    07/24/13  4:26 PM      Result Value Ref Range   Glucose-Capillary 277 (*) 70 - 99 mg/dL   Comment 1 Notify RN     Comment 2 Documented in Chart    GLUCOSE, CAPILLARY     Status: Abnormal   Collection Time    07/24/13  8:43 PM      Result Value Ref Range   Glucose-Capillary 221 (*) 70 - 99 mg/dL   Comment 1 Documented in Chart     Comment 2 Notify RN    CBC     Status: Abnormal   Collection Time    07/25/13  3:20 AM      Result Value Ref Range   WBC 12.5 (*) 4.0 - 10.5 K/uL   Comment: WHITE COUNT CONFIRMED ON SMEAR   RBC 3.84 (*) 3.87 - 5.11 MIL/uL   Hemoglobin 8.1 (*) 12.0 - 15.0 g/dL   HCT 25.1 (*) 36.0 - 46.0 %   MCV 65.4 (*) 78.0 - 100.0 fL   MCH 21.1 (*) 26.0 - 34.0 pg   MCHC 32.3  30.0 - 36.0 g/dL   RDW 18.3 (*) 11.5 - 15.5 %  Platelets 515 (*) 150 - 400 K/uL  GLUCOSE, CAPILLARY     Status: Abnormal   Collection Time    07/25/13  6:17 AM      Result Value Ref Range   Glucose-Capillary 213 (*) 70 - 99 mg/dL   Comment 1 Documented in Chart     Comment 2  Notify RN    GLUCOSE, CAPILLARY     Status: Abnormal   Collection Time    07/25/13 11:24 AM      Result Value Ref Range   Glucose-Capillary 176 (*) 70 - 99 mg/dL  CBC WITH DIFFERENTIAL     Status: Abnormal   Collection Time    07/25/13 11:50 AM      Result Value Ref Range   WBC 10.9 (*) 4.0 - 10.5 K/uL   RBC 3.84 (*) 3.87 - 5.11 MIL/uL   Hemoglobin 8.1 (*) 12.0 - 15.0 g/dL   HCT 25.1 (*) 36.0 - 46.0 %   MCV 65.4 (*) 78.0 - 100.0 fL   MCH 21.1 (*) 26.0 - 34.0 pg   MCHC 32.3  30.0 - 36.0 g/dL   RDW 18.3 (*) 11.5 - 15.5 %   Platelets 492 (*) 150 - 400 K/uL   Neutrophils Relative % 60  43 - 77 %   Lymphocytes Relative 28  12 - 46 %   Monocytes Relative 7  3 - 12 %   Eosinophils Relative 5  0 - 5 %   Basophils Relative 0  0 - 1 %   Neutro Abs 6.5  1.7 - 7.7 K/uL   Lymphs Abs 3.1  0.7 - 4.0 K/uL   Monocytes Absolute 0.8  0.1 - 1.0 K/uL   Eosinophils Absolute 0.5  0.0 - 0.7 K/uL   Basophils Absolute 0.0  0.0 - 0.1 K/uL   RBC Morphology POLYCHROMASIA PRESENT     Comment: TARGET CELLS  RETICULOCYTES     Status: Abnormal   Collection Time    07/25/13 11:50 AM      Result Value Ref Range   Retic Ct Pct 1.3  0.4 - 3.1 %   RBC. 3.84 (*) 3.87 - 5.11 MIL/uL   Retic Count, Manual 49.9  19.0 - 186.0 K/uL  TROPONIN I     Status: None   Collection Time    07/25/13 11:50 AM      Result Value Ref Range   Troponin I <0.30  <0.30 ng/mL   Comment:            Due to the release kinetics of cTnI,     a negative result within the first hours     of the onset of symptoms does not rule out     myocardial infarction with certainty.     If myocardial infarction is still suspected,     repeat the test at appropriate intervals.    No results found.  Assessment/Plan 1 chest pain-patient has had intermittent chest pain since her previous intervention. Her symptoms are somewhat atypical. Her enzymes are negative and electrocardiogram shows no ST changes. Would arrange outpatient functional study  for risk stratification. 2 coronary artery disease-continue aspirin and statin which has been reinitiated. 3 cocaine abuse-patient educated on the importance of avoiding. 4 hypertension-continue present medications. Avoid beta blocker given cocaine use. 5 diabetes mellitus-management per primary care. 6 hyperlipidemia-statin has been resumed. She will need followup lipids and liver in 6 weeks. Please call with questions. Kirk Ruths MD 07/25/2013, 2:17 PM

## 2013-07-25 NOTE — Progress Notes (Signed)
DC IV & tele per MD orders; DC pt per MD orders; Discharge instructions reviewed and signed by pt; no further questions from pt; no paper prescriptions; prescriptions were called into [facility].  Carollee Sires, RN

## 2013-07-28 ENCOUNTER — Telehealth: Payer: Self-pay | Admitting: Dietician

## 2013-07-28 NOTE — Discharge Summary (Signed)
Internal Medicine Attending  Date: 07/28/2013  Patient name: Shirley [name] Medical record number: [id number] Date of birth: 1975/06/15 Age: 38 y.o. Gender: female  Additional Comment: Patient's sinus tachycardia improved somewhat with IV volume replacement and discontinuation of amitriptyline.  The persisting episodic sinus tachycardia may represent autonomic dysfunction due to diabetes mellitus. Outpatient cardiology workup of her chest pain is planned, and may help clarify this problem as well.

## 2013-07-28 NOTE — Telephone Encounter (Signed)
Discharge date:07-25-13 Call date: 07-28-13 Hospital follow up appointment date: none made yet Calling to assist with transition of care from hospital to home. Left message for patient to call our office with questions or concerns.

## 2013-07-29 ENCOUNTER — Other Ambulatory Visit: Payer: Self-pay

## 2013-07-29 ENCOUNTER — Encounter (HOSPITAL_COMMUNITY): Payer: Self-pay | Admitting: Emergency Medicine

## 2013-07-29 ENCOUNTER — Emergency Department (HOSPITAL_COMMUNITY)
Admission: EM | Admit: 2013-07-29 | Discharge: 2013-07-30 | Disposition: A | Discharge status: Home / Self Care | Payer: Medicaid Other | Attending: Emergency Medicine | Admitting: Emergency Medicine

## 2013-07-29 DIAGNOSIS — I2510 Atherosclerotic heart disease of native coronary artery without angina pectoris: Secondary | ICD-10-CM | POA: Insufficient documentation

## 2013-07-29 DIAGNOSIS — Z8669 Personal history of other diseases of the nervous system and sense organs: Secondary | ICD-10-CM | POA: Insufficient documentation

## 2013-07-29 DIAGNOSIS — IMO0002 Reserved for concepts with insufficient information to code with codable children: Secondary | ICD-10-CM | POA: Insufficient documentation

## 2013-07-29 DIAGNOSIS — E669 Obesity, unspecified: Secondary | ICD-10-CM | POA: Insufficient documentation

## 2013-07-29 DIAGNOSIS — Z7982 Long term (current) use of aspirin: Secondary | ICD-10-CM | POA: Insufficient documentation

## 2013-07-29 DIAGNOSIS — G8929 Other chronic pain: Secondary | ICD-10-CM | POA: Insufficient documentation

## 2013-07-29 DIAGNOSIS — K219 Gastro-esophageal reflux disease without esophagitis: Secondary | ICD-10-CM | POA: Insufficient documentation

## 2013-07-29 DIAGNOSIS — K5900 Constipation, unspecified: Secondary | ICD-10-CM | POA: Insufficient documentation

## 2013-07-29 DIAGNOSIS — D649 Anemia, unspecified: Secondary | ICD-10-CM | POA: Insufficient documentation

## 2013-07-29 DIAGNOSIS — F329 Major depressive disorder, single episode, unspecified: Secondary | ICD-10-CM | POA: Insufficient documentation

## 2013-07-29 DIAGNOSIS — Z8742 Personal history of other diseases of the female genital tract: Secondary | ICD-10-CM | POA: Insufficient documentation

## 2013-07-29 DIAGNOSIS — E118 Type 2 diabetes mellitus with unspecified complications: Secondary | ICD-10-CM

## 2013-07-29 DIAGNOSIS — R109 Unspecified abdominal pain: Secondary | ICD-10-CM | POA: Insufficient documentation

## 2013-07-29 DIAGNOSIS — F411 Generalized anxiety disorder: Secondary | ICD-10-CM | POA: Insufficient documentation

## 2013-07-29 DIAGNOSIS — Z8673 Personal history of transient ischemic attack (TIA), and cerebral infarction without residual deficits: Secondary | ICD-10-CM | POA: Insufficient documentation

## 2013-07-29 DIAGNOSIS — I10 Essential (primary) hypertension: Secondary | ICD-10-CM | POA: Insufficient documentation

## 2013-07-29 DIAGNOSIS — G43909 Migraine, unspecified, not intractable, without status migrainosus: Secondary | ICD-10-CM | POA: Insufficient documentation

## 2013-07-29 DIAGNOSIS — Z79899 Other long term (current) drug therapy: Secondary | ICD-10-CM | POA: Insufficient documentation

## 2013-07-29 DIAGNOSIS — Z87891 Personal history of nicotine dependence: Secondary | ICD-10-CM | POA: Insufficient documentation

## 2013-07-29 DIAGNOSIS — Z794 Long term (current) use of insulin: Secondary | ICD-10-CM | POA: Insufficient documentation

## 2013-07-29 DIAGNOSIS — J45909 Unspecified asthma, uncomplicated: Secondary | ICD-10-CM | POA: Insufficient documentation

## 2013-07-29 DIAGNOSIS — Z3202 Encounter for pregnancy test, result negative: Secondary | ICD-10-CM | POA: Insufficient documentation

## 2013-07-29 DIAGNOSIS — E1165 Type 2 diabetes mellitus with hyperglycemia: Secondary | ICD-10-CM | POA: Insufficient documentation

## 2013-07-29 DIAGNOSIS — Z872 Personal history of diseases of the skin and subcutaneous tissue: Secondary | ICD-10-CM | POA: Insufficient documentation

## 2013-07-29 DIAGNOSIS — D509 Iron deficiency anemia, unspecified: Secondary | ICD-10-CM | POA: Insufficient documentation

## 2013-07-29 DIAGNOSIS — F3289 Other specified depressive episodes: Secondary | ICD-10-CM | POA: Insufficient documentation

## 2013-07-29 DIAGNOSIS — Z8619 Personal history of other infectious and parasitic diseases: Secondary | ICD-10-CM | POA: Insufficient documentation

## 2013-07-29 DIAGNOSIS — E785 Hyperlipidemia, unspecified: Secondary | ICD-10-CM | POA: Insufficient documentation

## 2013-07-29 DIAGNOSIS — Z859 Personal history of malignant neoplasm, unspecified: Secondary | ICD-10-CM | POA: Insufficient documentation

## 2013-07-29 NOTE — ED Notes (Signed)
N/v and lower abd. No bm x 1 week and no urine x 2 days. No fevers, chills. Emesis is bile.

## 2013-07-30 ENCOUNTER — Emergency Department (HOSPITAL_COMMUNITY): Payer: Medicaid Other

## 2013-07-30 LAB — I-STAT TROPONIN, ED: Troponin i, poc: 0 ng/mL (ref 0.00–0.08)

## 2013-07-30 LAB — CBC WITH DIFFERENTIAL/PLATELET
Basophils Absolute: 0 10*3/uL (ref 0.0–0.1)
Basophils Relative: 0 % (ref 0–1)
EOS ABS: 0.3 10*3/uL (ref 0.0–0.7)
EOS PCT: 2 % (ref 0–5)
HEMATOCRIT: 29.1 % — AB (ref 36.0–46.0)
HEMOGLOBIN: 9.6 g/dL — AB (ref 12.0–15.0)
LYMPHS PCT: 19 % (ref 12–46)
Lymphs Abs: 2.7 10*3/uL (ref 0.7–4.0)
MCH: 21.1 pg — ABNORMAL LOW (ref 26.0–34.0)
MCHC: 33 g/dL (ref 30.0–36.0)
MCV: 64 fL — ABNORMAL LOW (ref 78.0–100.0)
MONOS PCT: 7 % (ref 3–12)
Monocytes Absolute: 1 10*3/uL (ref 0.1–1.0)
NEUTROS ABS: 10.3 10*3/uL — AB (ref 1.7–7.7)
NEUTROS PCT: 72 % (ref 43–77)
Platelets: 629 10*3/uL — ABNORMAL HIGH (ref 150–400)
RBC: 4.55 MIL/uL (ref 3.87–5.11)
RDW: 18.2 % — ABNORMAL HIGH (ref 11.5–15.5)
WBC: 14.3 10*3/uL — ABNORMAL HIGH (ref 4.0–10.5)

## 2013-07-30 LAB — URINALYSIS, ROUTINE W REFLEX MICROSCOPIC
Bilirubin Urine: NEGATIVE
HGB URINE DIPSTICK: NEGATIVE
KETONES UR: NEGATIVE mg/dL
Leukocytes, UA: NEGATIVE
Nitrite: NEGATIVE
PH: 6 (ref 5.0–8.0)
PROTEIN: NEGATIVE mg/dL
Specific Gravity, Urine: 1.026 (ref 1.005–1.030)
Urobilinogen, UA: 1 mg/dL (ref 0.0–1.0)

## 2013-07-30 LAB — COMPREHENSIVE METABOLIC PANEL
ALT: 20 U/L (ref 0–35)
AST: 23 U/L (ref 0–37)
Albumin: 3.2 g/dL — ABNORMAL LOW (ref 3.5–5.2)
Alkaline Phosphatase: 62 U/L (ref 39–117)
BILIRUBIN TOTAL: 0.3 mg/dL (ref 0.3–1.2)
BUN: 15 mg/dL (ref 6–23)
CHLORIDE: 100 meq/L (ref 96–112)
CO2: 24 meq/L (ref 19–32)
Calcium: 9.2 mg/dL (ref 8.4–10.5)
Creatinine, Ser: 0.78 mg/dL (ref 0.50–1.10)
Glucose, Bld: 293 mg/dL — ABNORMAL HIGH (ref 70–99)
Potassium: 4.1 mEq/L (ref 3.7–5.3)
SODIUM: 138 meq/L (ref 137–147)
TOTAL PROTEIN: 8 g/dL (ref 6.0–8.3)

## 2013-07-30 LAB — URINE MICROSCOPIC-ADD ON

## 2013-07-30 LAB — PREGNANCY, URINE: PREG TEST UR: NEGATIVE

## 2013-07-30 LAB — LIPASE, BLOOD: LIPASE: 35 U/L (ref 11–59)

## 2013-07-30 MED ORDER — ONDANSETRON HCL 4 MG/2ML IJ SOLN
INTRAMUSCULAR | Status: AC
  Filled 2013-07-30: qty 2

## 2013-07-30 MED ORDER — MORPHINE SULFATE 4 MG/ML IJ SOLN
INTRAMUSCULAR | Status: AC
  Filled 2013-07-30: qty 1

## 2013-07-30 MED ORDER — DIPHENHYDRAMINE HCL 50 MG/ML IJ SOLN
INTRAMUSCULAR | Status: AC
  Filled 2013-07-30: qty 1

## 2013-07-30 MED ORDER — PEG 3350-KCL-NABCB-NACL-NASULF 236 G PO SOLR: 4.0000 L | Freq: Once | ORAL | Status: DC

## 2013-07-30 NOTE — ED Provider Notes (Signed)
CSN: [id number]     Arrival date & time 07/29/13  2325 History   First MD Initiated Contact with Patient 07/30/13 0024     Chief Complaint  Patient presents with  . Abdominal Pain     (Consider location/radiation/quality/duration/timing/severity/associated sxs/prior Treatment) Patient is a 38 y.o. female presenting with abdominal pain. The history is provided by the patient.  Abdominal Pain She has chronic abdominal pain but pain has gotten worse over the last several days. At that time, she has had nausea and vomiting and appears that she's not had a bowel movement in the last 2 days and has not urinated in the last 2 days. Her pain seems to be worse specifically because of the pressure from a bowel movement. She currently rates pain at 10/10. She has tried taking Senokot and another laxative but she cannot remember what it was but these have not helped. She has not had any fever or chills but has had some sweats.  Past Medical History  Diagnosis Date  . Thrombocytosis     Hem/Onc suggested 2/2 chronic hepatits and/or iron deficiency anemia  . Iron deficiency anemia   . N&V (nausea and vomiting)     Chronic. Unclear etiology with multiple admission and ED visits. CT abdomen with and without contrast (02/2011)  showed no acute process. Gastic Emptying scan (01/2010) was normal. Ultrasound of the abdomen was within normal limits. Hepatitis B viral load was undectable. HIV NR. EGD - gastritis, Hpylori + s/p Rx  . Hypertension   . Hyperlipidemia   . Diabetes mellitus type 2, uncontrolled, with complications   . Recurrent boils   . Back pain   . OSA (obstructive sleep apnea)   . 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 Cath 12/10- no restenosis or significant CAD progression  . Irregular menses     Small ovarian follicles seen on YF(7/49)  . History of pyelonephritis     H/o  GrpB Pyelonephritis (9/06) and UTI- 07/11- E.Coli, 12/10- GBS  . Abscess of tunica vaginalis     10/09- Abundant S. aureus- sensitive to all abx  . Obesity   . Asthma   . Gastritis   . Depression   . Hepatitis B, chronic     Hep BeAb+,Hep B cAb+ & Hep BsAg+ (9/06)  . Peripheral neuropathy   . Fibromyalgia   . GERD (gastroesophageal reflux disease)   . Migraine   . Anxiety   . Gastroparesis     secondary to poorly controlled DM, last emptying study performed 01/2010  was normal but may be falsely positive as pt was on reglan  . Blood dyscrasia   . H/O: CVA (cerebrovascular accident)     history of remote right cerebellar infarct noted on head CT at least since 10/2011  . Cancer    Past Surgical History  Procedure Laterality Date  . Cesarean section  1997   Family History  Problem Relation Age of Onset  . Diabetes Father    History  Substance Use Topics  . Smoking status: Former Smoker    Types: Cigarettes    Quit date: 04/24/1996  . Smokeless tobacco: Never Used     Comment: quit smoking cigarettes age 65  . Alcohol Use: Yes     Comment: occasionally   OB History   Grav Para Term Preterm Abortions TAB SAB Ect Mult Living  Review of Systems  Gastrointestinal: Positive for abdominal pain.  All other systems reviewed and are negative.      Allergies  Lisinopril and Morphine and related  Home Medications   Current Outpatient Rx  Name  Route  Sig  Dispense  Refill  . albuterol (PROVENTIL HFA;VENTOLIN HFA) 108 (90 BASE) MCG/ACT inhaler   Inhalation   Inhale 2 puffs into the lungs every 4 (four) hours as needed for wheezing or shortness of breath.         Marland Kitchen amLODipine (NORVASC) 10 MG tablet   Oral   Take 1 tablet (10 mg total) by mouth daily.   30 tablet   6   . aspirin EC 81 MG EC tablet   Oral   Take 1 tablet (81 mg total) by mouth daily.   30 tablet   2   . atorvastatin (LIPITOR) 80 MG tablet   Oral   Take 1 tablet (80 mg total)  by mouth daily at 6 PM.   30 tablet   2   . calcium carbonate (TUMS - DOSED IN MG ELEMENTAL CALCIUM) 500 MG chewable tablet   Oral   Chew 2 tablets by mouth daily.          . ferrous sulfate 325 (65 FE) MG tablet   Oral   Take 1 tablet (325 mg total) by mouth daily.   30 tablet   3   . Fluticasone-Salmeterol (ADVAIR) 100-50 MCG/DOSE AEPB   Inhalation   Inhale 1 puff into the lungs every 12 (twelve) hours.   60 each   6   . gabapentin (NEURONTIN) 300 MG capsule   Oral   Take 300 mg by mouth 3 (three) times daily.         . insulin aspart (NOVOLOG) 100 UNIT/ML injection   Subcutaneous   Inject 2-12 Units into the skin 3 (three) times daily as needed for high blood sugar. Sliding scale- If sugar 150-199: inject 2 unit;  200-249: 4 units, 250-299: 7 units; 300-349: 10 units; Over 350: 12 units         . insulin glargine (LANTUS) 100 UNIT/ML injection   Subcutaneous   Inject 50 Units into the skin at bedtime.         Marland Kitchen ketoconazole (NIZORAL) 2 % cream   Topical   Apply 1 application topically daily.   15 g   0   . LORazepam (ATIVAN) 1 MG tablet   Oral   Take 1 tablet (1 mg total) by mouth 3 (three) times daily as needed for anxiety.   15 tablet   0   . omeprazole (PRILOSEC) 40 MG capsule   Oral   Take 1 capsule (40 mg total) by mouth daily.   31 capsule   6   . ondansetron (ZOFRAN-ODT) 4 MG disintegrating tablet   Oral   Take 4 mg by mouth every 4 (four) hours as needed for nausea or vomiting.         . polyethylene glycol powder (GLYCOLAX/MIRALAX) powder   Oral   Take 17 g by mouth daily.   255 g   0   . pregabalin (LYRICA) 75 MG capsule   Oral   Take 1 capsule (75 mg total) by mouth 2 (two) times daily.   60 capsule   6   . senna-docusate (SENOKOT-S) 8.6-50 MG per tablet   Oral   Take 2 tablets by mouth at bedtime.   30 tablet   3  BP 178/100  Pulse 128  Temp(Src) 98 F (36.7 C) (Oral)  Resp 20  Ht 5\' 4"  (1.626 m)  Wt 206 lb 9 oz  (93.696 kg)  BMI 35.44 kg/m2  SpO2 99%  LMP 07/27/2013 Physical Exam  Nursing note and vitals reviewed.  38 year old female, who appears uncomfortable, but is in no acute distress. Vital signs are significant for tachycardia with heart rate 128, and hypertension with blood pressure 170/100. Oxygen saturation is 99%, which is normal. Head is normocephalic and atraumatic. PERRLA, EOMI. Oropharynx is clear. Neck is nontender and supple without adenopathy or JVD. Back is nontender and there is no CVA tenderness. Lungs are clear without rales, wheezes, or rhonchi. Chest is nontender. Heart has regular rate and rhythm without murmur. Abdomen is soft, flat, with mild tenderness across the lower abdomen. There is no rebound or guarding. There are no masses or hepatosplenomegaly and peristalsis is hypoactive. Bladder is not distended. Rectal: Normal sphincter tone, no hemorrhoids or masses, soft fecal impaction is present. Extremities have no cyanosis or edema, full range of motion is present. Skin is warm and dry without rash. Neurologic: Mental status is normal, cranial nerves are intact, there are no motor or sensory deficits.  ED Course  Procedures (including critical care time) Labs Review Labs Reviewed  CBC WITH DIFFERENTIAL - Abnormal; Notable for the following:    WBC 14.3 (*)    Hemoglobin 9.6 (*)    HCT 29.1 (*)    MCV 64.0 (*)    MCH 21.1 (*)    RDW 18.2 (*)    Platelets 629 (*)    All other components within normal limits  COMPREHENSIVE METABOLIC PANEL  LIPASE, BLOOD  PREGNANCY, URINE  URINALYSIS, ROUTINE W REFLEX MICROSCOPIC  I-STAT TROPOININ, ED  POC URINE PREG, ED   Imaging Review No results found.   Date: 07/30/2013  Rate: 129  Rhythm: sinus tachycardia  QRS Axis: normal  Intervals: normal  ST/T Wave abnormalities: nonspecific T wave changes  Conduction Disutrbances:none  Narrative Interpretation: Sinus tachycardia, nonspecific T wave abnormality. When  compared with ECG of 07/25/2013, sinus tachycardia has replaced atrial fibrillation.  Old EKG Reviewed: changes noted    MDM   Final diagnoses:  Abdominal pain  Constipation  Anemia    Abdominal pain which is an exacerbation of chronic abdominal pain and worsened by constipation with soft fecal impaction. Abdominal x-rays are obtained showing evidence of large stool burden but no acute process. WBC is mildly elevated but without left shift. She is discharged with a prescription for GoLYTELY and is referred back to her PCP.    Delora Fuel, MD 97/67/34 [id number]

## 2013-07-30 NOTE — Discharge Instructions (Signed)
Abdominal Pain, Adult Many things can cause abdominal pain. Usually, abdominal pain is not caused by a disease and will improve without treatment. It can often be observed and treated at home. Your health care provider will do a physical exam and possibly order blood tests and X-rays to help determine the seriousness of your pain. However, in many cases, more time must pass before a clear cause of the pain can be found. Before that point, your health care provider may not know if you need more testing or further treatment. HOME CARE INSTRUCTIONS  Monitor your abdominal pain for any changes. The following actions may help to alleviate any discomfort you are experiencing:  Only take over-the-counter or prescription medicines as directed by your health care provider.  Do not take laxatives unless directed to do so by your health care provider.  Try a clear liquid diet (broth, tea, or water) as directed by your health care provider. Slowly move to a bland diet as tolerated. SEEK MEDICAL CARE IF:  You have unexplained abdominal pain.  You have abdominal pain associated with nausea or diarrhea.  You have pain when you urinate or have a bowel movement.  You experience abdominal pain that wakes you in the night.  You have abdominal pain that is worsened or improved by eating food.  You have abdominal pain that is worsened with eating fatty foods. SEEK IMMEDIATE MEDICAL CARE IF:   Your pain does not go away within 2 hours.  You have a fever.  You keep throwing up (vomiting).  Your pain is felt only in portions of the abdomen, such as the right side or the left lower portion of the abdomen.  You pass bloody or black tarry stools. MAKE SURE YOU:  Understand these instructions.   Will watch your condition.   Will get help right away if you are not doing well or get worse.  Document Released: 01/18/2005 Document Revised: 01/29/2013 Document Reviewed: 12/18/2012 [facility] Patient  Information 2014 [place].  Constipation, Adult Constipation is when a person has fewer than 3 bowel movements a week; has difficulty having a bowel movement; or has stools that are dry, hard, or larger than normal. As people grow older, constipation is more common. If you try to fix constipation with medicines that make you have a bowel movement (laxatives), the problem may get worse. Long-term laxative use may cause the muscles of the colon to become weak. A low-fiber diet, not taking in enough fluids, and taking certain medicines may make constipation worse. CAUSES   Certain medicines, such as antidepressants, pain medicine, iron supplements, antacids, and water pills.   Certain diseases, such as diabetes, irritable bowel syndrome (IBS), thyroid disease, or depression.   Not drinking enough water.   Not eating enough fiber-rich foods.   Stress or travel.  Lack of physical activity or exercise.  Not going to the restroom when there is the urge to have a bowel movement.  Ignoring the urge to have a bowel movement.  Using laxatives too much. SYMPTOMS   Having fewer than 3 bowel movements a week.   Straining to have a bowel movement.   Having hard, dry, or larger than normal stools.   Feeling full or bloated.   Pain in the lower abdomen.  Not feeling relief after having a bowel movement. DIAGNOSIS  Your caregiver will take a medical history and perform a physical exam. Further testing may be done for severe constipation. Some tests may include:   A barium  enema X-ray to examine your rectum, colon, and sometimes, your small intestine.  A sigmoidoscopy to examine your lower colon.  A colonoscopy to examine your entire colon. TREATMENT  Treatment will depend on the severity of your constipation and what is causing it. Some dietary treatments include drinking more fluids and eating more fiber-rich foods. Lifestyle treatments may include regular exercise. If these  diet and lifestyle recommendations do not help, your caregiver may recommend taking over-the-counter laxative medicines to help you have bowel movements. Prescription medicines may be prescribed if over-the-counter medicines do not work.  HOME CARE INSTRUCTIONS   Increase dietary fiber in your diet, such as fruits, vegetables, whole grains, and beans. Limit high-fat and processed sugars in your diet, such as [place] fries, hamburgers, cookies, candies, and soda.   A fiber supplement may be added to your diet if you cannot get enough fiber from foods.   Drink enough fluids to keep your urine clear or pale yellow.   Exercise regularly or as directed by your caregiver.   Go to the restroom when you have the urge to go. Do not hold it.  Only take medicines as directed by your caregiver. Do not take other medicines for constipation without talking to your caregiver first. [place] IF:   You have bright red blood in your stool.   Your constipation lasts for more than 4 days or gets worse.   You have abdominal or rectal pain.   You have thin, pencil-like stools.  You have unexplained weight loss. MAKE SURE YOU:   Understand these instructions.  Will watch your condition.  Will get help right away if you are not doing well or get worse. Document Released: 01/07/2004 Document Revised: 07/03/2011 Document Reviewed: 01/20/2013 [facility] Patient Information 2014 [place], [place].  Polyethylene Glycol; Electrolytes oral solution What is this medicine? POLYETHYLENE GLYCOL; ELECTROLYTES (pol ee ETH i leen GLYE col; i LEK truh lahyts) is a laxative. It is used to clean out the bowel before a colonoscopy. This medicine may be used for other purposes; ask your health care provider or pharmacist if you have questions. COMMON BRAND NAME(S): Colyte, GaviLyte-C, GaviLyte-G, GaviLyte-N, GoLYTELY, NuLYTELY, OCL, Suclear, TriLyte What should I tell my health care provider before  I take this medicine? They need to know if you have any of these conditions: -any chronic disease of the intestine, stomach, or throat -bloating or pain in stomach area -difficulty swallowing -G6PD deficiency -heart disease -kidney disease -low levels of sodium in the blood -phenylketonuria -seizures -an unusual or allergic reaction to polyethylene glycol, other medicines, dyes, or preservatives -pregnant or trying to get pregnant -breast-feeding How should I use this medicine? Take this medicine by mouth. Before using this medicine you or your pharmacist must fill the container with the amount of water indicated on the package label. Chill solution before using to improve taste. Shake well before each dose. Your doctor will tell you what time to start this medicine. Take exactly as directed. You will usually have the first bowel movement about 1 hour after you begin drinking the solution. After that, you will have frequent watery bowel movements. You will need to follow a special diet before your procedure. Talk to your doctor. Do not eat any solid foods for 3 to 4 hours before taking this medicine. A special MedGuide will be given to you by the pharmacist with each prescription and refill. Be sure to read this information carefully each time. Talk to your pediatrician regarding the  use of this medicine in children. Special care may be needed. Overdosage: If you think you have taken too much of this medicine contact a poison control center or emergency room at once. NOTE: This medicine is only for you. Do not share this medicine with others. What if I miss a dose? You should talk to your doctor if you are not able to complete the bowel preparation as advised. What may interact with this medicine? Do not take any other medicine by mouth starting one hour before you take this medicine. Talk to your doctor about when to take your other medicines. This medicine may interact with the following  medications: -certain medicines for blood pressure, heart disease, irregular heart beat -certain medicines for kidney disease -certain medicines for seizures like carbamazepine, phenobarbital, phenytoin -diuretics -laxatives -NSAIDS, medicines for pain and inflammation, like ibuprofen or naproxen This list may not describe all possible interactions. Give your health care provider a list of all the medicines, herbs, non-prescription drugs, or dietary supplements you use. Also tell them if you smoke, drink alcohol, or use illegal drugs. Some items may interact with your medicine. What should I watch for while using this medicine? Tell your doctor if you experience any changes in bowel habits that are severe or last longer than three weeks. Do not inhale dust from the solution powder. This can make breathing difficult or may cause sneezing or an allergic-type reaction. Bloating may occur before the first bowel movement. If your discomfort continues while you are drinking the solution, stop drinking the solution temporarily or drink each portion with longer spaces in between until your symptoms disappear. Contact your doctor or health care professional if you have any concerns. What side effects may I notice from receiving this medicine? Side effects that you should report to your doctor or health care professional as soon as possible: -allergic reactions like skin rash, itching or hives, swelling of the face, lips, or tongue -breathing problems -chest tightness -dizziness -fast, irregular heartbeat -headache -seizures -trouble passing urine or change in the amount of urine -vomiting Side effects that usually do not require medical attention (report to your doctor or health care professional if they continue or are bothersome): -anal irritation -bloating, pain, or distension of the stomach -chills -increased hunger or thirst -nausea -stomach gas -tiredness -trouble sleeping This list may  not describe all possible side effects. Call your doctor for medical advice about side effects. You may report side effects to FDA at 1-800-FDA-1088. Where should I keep my medicine? Keep out of the reach of children. Store the solution in the refrigerator to improve the taste. Do not freeze. Throw away any unused medicine 48 hours after mixing. NOTE: This sheet is a summary. It may not cover all possible information. If you have questions about this medicine, talk to your doctor, pharmacist, or health care provider.  2014, [employer]. (2009-09-09 12:51:08)

## 2013-07-30 NOTE — ED Notes (Signed)
Patient transported to X-ray 

## 2013-07-30 NOTE — ED Notes (Signed)
Pt c/o itching from Morphine; Md notified; New order given

## 2013-07-31 LAB — URINE CULTURE
Colony Count: NO GROWTH
Culture: NO GROWTH

## 2013-08-08 ENCOUNTER — Other Ambulatory Visit: Payer: Self-pay | Admitting: Internal Medicine

## 2013-08-09 ENCOUNTER — Emergency Department (HOSPITAL_COMMUNITY): Payer: Medicaid Other

## 2013-08-09 ENCOUNTER — Emergency Department (HOSPITAL_COMMUNITY)
Admission: EM | Admit: 2013-08-09 | Discharge: 2013-08-09 | Disposition: A | Discharge status: Home / Self Care | Payer: Medicaid Other | Attending: Emergency Medicine | Admitting: Emergency Medicine

## 2013-08-09 ENCOUNTER — Encounter (HOSPITAL_COMMUNITY): Payer: Self-pay | Admitting: Emergency Medicine

## 2013-08-09 DIAGNOSIS — Z8673 Personal history of transient ischemic attack (TIA), and cerebral infarction without residual deficits: Secondary | ICD-10-CM | POA: Insufficient documentation

## 2013-08-09 DIAGNOSIS — E669 Obesity, unspecified: Secondary | ICD-10-CM | POA: Insufficient documentation

## 2013-08-09 DIAGNOSIS — I252 Old myocardial infarction: Secondary | ICD-10-CM | POA: Insufficient documentation

## 2013-08-09 DIAGNOSIS — D509 Iron deficiency anemia, unspecified: Secondary | ICD-10-CM | POA: Insufficient documentation

## 2013-08-09 DIAGNOSIS — Z7982 Long term (current) use of aspirin: Secondary | ICD-10-CM | POA: Insufficient documentation

## 2013-08-09 DIAGNOSIS — Z87891 Personal history of nicotine dependence: Secondary | ICD-10-CM | POA: Insufficient documentation

## 2013-08-09 DIAGNOSIS — Z872 Personal history of diseases of the skin and subcutaneous tissue: Secondary | ICD-10-CM | POA: Insufficient documentation

## 2013-08-09 DIAGNOSIS — R42 Dizziness and giddiness: Secondary | ICD-10-CM | POA: Insufficient documentation

## 2013-08-09 DIAGNOSIS — IMO0002 Reserved for concepts with insufficient information to code with codable children: Secondary | ICD-10-CM | POA: Insufficient documentation

## 2013-08-09 DIAGNOSIS — Z87440 Personal history of urinary (tract) infections: Secondary | ICD-10-CM | POA: Insufficient documentation

## 2013-08-09 DIAGNOSIS — I10 Essential (primary) hypertension: Secondary | ICD-10-CM | POA: Insufficient documentation

## 2013-08-09 DIAGNOSIS — K219 Gastro-esophageal reflux disease without esophagitis: Secondary | ICD-10-CM | POA: Insufficient documentation

## 2013-08-09 DIAGNOSIS — Z794 Long term (current) use of insulin: Secondary | ICD-10-CM | POA: Insufficient documentation

## 2013-08-09 DIAGNOSIS — Z859 Personal history of malignant neoplasm, unspecified: Secondary | ICD-10-CM | POA: Insufficient documentation

## 2013-08-09 DIAGNOSIS — R1310 Dysphagia, unspecified: Secondary | ICD-10-CM | POA: Insufficient documentation

## 2013-08-09 DIAGNOSIS — E118 Type 2 diabetes mellitus with unspecified complications: Secondary | ICD-10-CM

## 2013-08-09 DIAGNOSIS — G43909 Migraine, unspecified, not intractable, without status migrainosus: Secondary | ICD-10-CM | POA: Insufficient documentation

## 2013-08-09 DIAGNOSIS — E1165 Type 2 diabetes mellitus with hyperglycemia: Secondary | ICD-10-CM | POA: Insufficient documentation

## 2013-08-09 DIAGNOSIS — Z8742 Personal history of other diseases of the female genital tract: Secondary | ICD-10-CM | POA: Insufficient documentation

## 2013-08-09 DIAGNOSIS — I2510 Atherosclerotic heart disease of native coronary artery without angina pectoris: Secondary | ICD-10-CM | POA: Insufficient documentation

## 2013-08-09 DIAGNOSIS — E785 Hyperlipidemia, unspecified: Secondary | ICD-10-CM | POA: Insufficient documentation

## 2013-08-09 DIAGNOSIS — F411 Generalized anxiety disorder: Secondary | ICD-10-CM | POA: Insufficient documentation

## 2013-08-09 DIAGNOSIS — Z79899 Other long term (current) drug therapy: Secondary | ICD-10-CM | POA: Insufficient documentation

## 2013-08-09 DIAGNOSIS — Z8739 Personal history of other diseases of the musculoskeletal system and connective tissue: Secondary | ICD-10-CM | POA: Insufficient documentation

## 2013-08-09 DIAGNOSIS — G609 Hereditary and idiopathic neuropathy, unspecified: Secondary | ICD-10-CM | POA: Insufficient documentation

## 2013-08-09 DIAGNOSIS — J45901 Unspecified asthma with (acute) exacerbation: Secondary | ICD-10-CM | POA: Insufficient documentation

## 2013-08-09 DIAGNOSIS — R0789 Other chest pain: Secondary | ICD-10-CM | POA: Insufficient documentation

## 2013-08-09 DIAGNOSIS — Z8619 Personal history of other infectious and parasitic diseases: Secondary | ICD-10-CM | POA: Insufficient documentation

## 2013-08-09 LAB — CBC
HEMATOCRIT: 26.6 % — AB (ref 36.0–46.0)
HEMOGLOBIN: 8.7 g/dL — AB (ref 12.0–15.0)
MCH: 20.8 pg — ABNORMAL LOW (ref 26.0–34.0)
MCHC: 32.7 g/dL (ref 30.0–36.0)
MCV: 63.5 fL — AB (ref 78.0–100.0)
Platelets: 462 10*3/uL — ABNORMAL HIGH (ref 150–400)
RBC: 4.19 MIL/uL (ref 3.87–5.11)
RDW: 17.9 % — ABNORMAL HIGH (ref 11.5–15.5)
WBC: 13.5 10*3/uL — ABNORMAL HIGH (ref 4.0–10.5)

## 2013-08-09 LAB — BASIC METABOLIC PANEL
BUN: 15 mg/dL (ref 6–23)
CHLORIDE: 98 meq/L (ref 96–112)
CO2: 21 mEq/L (ref 19–32)
CREATININE: 0.72 mg/dL (ref 0.50–1.10)
Calcium: 9.3 mg/dL (ref 8.4–10.5)
GFR calc Af Amer: >90 mL/min (ref >90)
GFR calc non Af Amer: >90 mL/min (ref >90)
GLUCOSE: 298 mg/dL — AB (ref 70–99)
POTASSIUM: 3.8 meq/L (ref 3.7–5.3)
Sodium: 134 mEq/L — ABNORMAL LOW (ref 137–147)

## 2013-08-09 LAB — I-STAT TROPONIN, ED
Troponin i, poc: 0 ng/mL (ref 0.00–0.08)
Troponin i, poc: 0 ng/mL (ref 0.00–0.08)

## 2013-08-09 LAB — PRO B NATRIURETIC PEPTIDE: Pro B Natriuretic peptide (BNP): 167 pg/mL — ABNORMAL HIGH (ref 0–125)

## 2013-08-09 MED ORDER — ACETAMINOPHEN 325 MG PO TABS
650.0000 mg | ORAL_TABLET | Freq: Once | ORAL | Status: AC
  Administered 2013-08-09: 650 mg via ORAL
  Filled 2013-08-09: qty 2

## 2013-08-09 MED ORDER — OXYCODONE-ACETAMINOPHEN 5-325 MG PO TABS
2.0000 | ORAL_TABLET | Freq: Once | ORAL | Status: AC
  Administered 2013-08-09: 2 via ORAL
  Filled 2013-08-09: qty 2

## 2013-08-09 MED ORDER — LORAZEPAM 1 MG PO TABS: 1.0000 mg | ORAL_TABLET | Freq: Three times a day (TID) | ORAL | Status: DC | PRN

## 2013-08-09 NOTE — ED Notes (Signed)
Pt presents with c/o center chest pain and shortness of breath onset 30 mins ago while at a funeral. Pt presents with labored breathing.

## 2013-08-09 NOTE — ED Provider Notes (Signed)
CSN: [id number]     Arrival date & time 08/09/13  1203 History   First MD Initiated Contact with Patient 08/09/13 1216     Chief Complaint  Patient presents with  . Shortness of Breath  . Chest Pain     (Consider location/radiation/quality/duration/timing/severity/associated sxs/prior Treatment) HPI Complains of anterior chest pain pleuritic in nature worse with changing positions or swallowing coming by lightheadedness and shortness of breath onset 1 hour ago while at church. Patient states that she gets similar pain approximately every 3 or 4 days. Last used cocaine 4 days ago. No treatment prior to coming here. She states that she would typically treat his pain with Percocet or Vicodin if she had some however did not have any with her presently. No treatment prior to coming here pain is nonexertional no other associated symptoms. Past Medical History  Diagnosis Date  . Thrombocytosis     Hem/Onc suggested 2/2 chronic hepatits and/or iron deficiency anemia  . Iron deficiency anemia   . N&V (nausea and vomiting)     Chronic. Unclear etiology with multiple admission and ED visits. CT abdomen with and without contrast (02/2011)  showed no acute process. Gastic Emptying scan (01/2010) was normal. Ultrasound of the abdomen was within normal limits. Hepatitis B viral load was undectable. HIV NR. EGD - gastritis, Hpylori + s/p Rx  . Hypertension   . Hyperlipidemia   . Diabetes mellitus type 2, uncontrolled, with complications   . Recurrent boils   . Back pain   . OSA (obstructive sleep apnea)   . 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 Cath 12/10- no restenosis or significant CAD progression  . Irregular menses     Small ovarian follicles seen on [id number]  . History of pyelonephritis     H/o GrpB Pyelonephritis (9/06) and UTI- 07/11- E.Coli, 12/10- GBS  . Abscess of tunica  vaginalis     10/09- Abundant S. aureus- sensitive to all abx  . Obesity   . Asthma   . Gastritis   . Depression   . Hepatitis B, chronic     Hep BeAb+,Hep B cAb+ & Hep BsAg+ (9/06)  . Peripheral neuropathy   . Fibromyalgia   . GERD (gastroesophageal reflux disease)   . Migraine   . Anxiety   . Gastroparesis     secondary to poorly controlled DM, last emptying study performed 01/2010  was normal but may be falsely positive as pt was on reglan  . Blood dyscrasia   . H/O: CVA (cerebrovascular accident)     history of remote right cerebellar infarct noted on head CT at least since 10/2011  . Cancer   . MI (myocardial infarction)    Past Surgical History  Procedure Laterality Date  . Cesarean section  1997   Family History  Problem Relation Age of Onset  . Diabetes Father    History  Substance Use Topics  . Smoking status: Former Smoker    Types: Cigarettes    Quit date: 04/24/1996  . Smokeless tobacco: Never Used     Comment: quit smoking cigarettes age 84  . Alcohol Use: Yes     Comment: occasionally   OB History   Grav Para Term Preterm Abortions TAB SAB Ect Mult Living                 Review of Systems  Constitutional: Negative.  HENT: Negative.        Odynyphagia  Respiratory: Positive for shortness of breath.   Cardiovascular: Positive for chest pain.  Gastrointestinal: Negative.   Musculoskeletal: Negative.   Skin: Negative.   Neurological: Positive for light-headedness.  Psychiatric/Behavioral: Negative.   All other systems reviewed and are negative.     Allergies  Lisinopril and Morphine and related  Home Medications   Prior to Admission medications   Medication Sig Start Date End Date Taking? Authorizing Provider  albuterol (PROVENTIL HFA;VENTOLIN HFA) 108 (90 BASE) MCG/ACT inhaler Inhale 2 puffs into the lungs every 4 (four) hours as needed for wheezing or shortness of breath.    Historical Provider, MD  amLODipine (NORVASC) 10 MG tablet  Take 1 tablet (10 mg total) by mouth daily. 04/22/13   Jeralene Huff, MD  aspirin EC 81 MG EC tablet Take 1 tablet (81 mg total) by mouth daily. 07/26/13   Rebecca Eaton, MD  atorvastatin (LIPITOR) 80 MG tablet Take 1 tablet (80 mg total) by mouth daily at 6 PM. 07/25/13   Rebecca Eaton, MD  calcium carbonate (TUMS - DOSED IN MG ELEMENTAL CALCIUM) 500 MG chewable tablet Chew 2 tablets by mouth daily.     Historical Provider, MD  ferrous sulfate 325 (65 FE) MG tablet Take 1 tablet (325 mg total) by mouth daily. 07/25/13   Rebecca Eaton, MD  Fluticasone-Salmeterol (ADVAIR) 100-50 MCG/DOSE AEPB Inhale 1 puff into the lungs every 12 (twelve) hours. 03/22/13   Blanchie Dessert, MD  gabapentin (NEURONTIN) 300 MG capsule Take 300 mg by mouth 3 (three) times daily.    Historical Provider, MD  insulin aspart (NOVOLOG) 100 UNIT/ML injection Inject 2-12 Units into the skin 3 (three) times daily as needed for high blood sugar. Sliding scale- If sugar 150-199: inject 2 unit;  200-249: 4 units, 250-299: 7 units; 300-349: 10 units; Over 350: 12 units    Historical Provider, MD  insulin glargine (LANTUS) 100 UNIT/ML injection Inject 50 Units into the skin at bedtime. 05/01/13   Juluis Mire, MD  ketoconazole (NIZORAL) 2 % cream Apply 1 application topically daily. 07/21/13   Jeralene Huff, MD  LORazepam (ATIVAN) 1 MG tablet Take 1 tablet (1 mg total) by mouth 3 (three) times daily as needed for anxiety. 05/20/13   Garald Balding, NP  omeprazole (PRILOSEC) 40 MG capsule Take 1 capsule (40 mg total) by mouth daily. 03/22/13   Blanchie Dessert, MD  ondansetron (ZOFRAN-ODT) 4 MG disintegrating tablet Take 4 mg by mouth every 4 (four) hours as needed for nausea or vomiting.    Historical Provider, MD  polyethylene glycol (GOLYTELY) 236 G solution Take 4,000 mLs by mouth once. Drink 8 ounces every 15 minutes until you are passing clear liquid. 05/29/34   Delora Fuel, MD  polyethylene glycol powder  (GLYCOLAX/MIRALAX) powder Take 17 g by mouth daily. 07/21/13   Jeralene Huff, MD  pregabalin (LYRICA) 75 MG capsule Take 1 capsule (75 mg total) by mouth 2 (two) times daily. 11/04/12   Jeralene Huff, MD  senna-docusate (SENOKOT-S) 8.6-50 MG per tablet Take 2 tablets by mouth at bedtime. 07/21/13   Jeralene Huff, MD   BP 152/97  Pulse 93  Temp(Src) 98.2 F (36.8 C)  Resp 16  Ht 5\' 7"  (1.702 m)  Wt 196 lb (88.905 kg)  BMI 30.69 kg/m2  SpO2 100%  LMP 07/27/2013 Physical Exam  Nursing note and vitals reviewed. Constitutional: She appears well-developed and well-nourished. No distress.  HENT:  Head: Normocephalic and atraumatic.  Eyes: Conjunctivae are normal. Pupils are equal, round, and reactive to light.  Neck: Neck supple. No tracheal deviation present. No thyromegaly present.  Cardiovascular: Normal rate and regular rhythm.   No murmur heard. Pulmonary/Chest: Effort normal and breath sounds normal. No respiratory distress.  Chest wall is exquisitely tender. Pain is reproduced by forcible abduction of either shoulder  Abdominal: Soft. Bowel sounds are normal. She exhibits no distension. There is no tenderness.  Obese  Musculoskeletal: Normal range of motion. She exhibits no edema and no tenderness.  Neurological: She is alert. Coordination normal.  Skin: Skin is warm and dry. No rash noted.  Psychiatric: She has a normal mood and affect.    ED Course  Procedures (including critical care time) Labs Review Labs Reviewed  CBC - Abnormal; Notable for the following:    WBC 13.5 (*)    Hemoglobin 8.7 (*)    HCT 26.6 (*)    MCV 63.5 (*)    MCH 20.8 (*)    RDW 17.9 (*)    Platelets 462 (*)    All other components within normal limits  BASIC METABOLIC PANEL  PRO B NATRIURETIC PEPTIDE  CBC WITH DIFFERENTIAL  I-STAT TROPOININ, ED  I-STAT CHEM 8, ED  I-STAT TROPOININ, ED    Imaging Review Dg Chest 2 View (if Patient Has Fever And/or  Copd)  08/09/2013   CLINICAL DATA:  Chest pain for 1 hr, shortness of breath, history of CAD in diabetes  EXAM: CHEST  2 VIEW  COMPARISON:  07/30/2013; 07/22/2013; 07/02/2012  FINDINGS: Grossly unchanged cardiac silhouette and mediastinal contours. No focal parenchymal opacities. No pleural effusion or pneumothorax. There is minimal pleural parenchymal thickening about the bilateral major fissures. No evidence of edema. Grossly unchanged bones.  IMPRESSION: No acute cardiopulmonary disease.   Electronically Signed   By: Sandi Mariscal M.D.   On: 08/09/2013 12:49     EKG Interpretation None      Date: 08/09/2013  Rate: 90  Rhythm: normal sinus rhythm  QRS Axis: normal  Intervals: normal  ST/T Wave abnormalities: normal  Conduction Disutrbances: none  Narrative Interpretation: unremarkable  Chest x-ray viewed by me Results for orders placed during the hospital encounter of 08/09/13  CBC      Result Value Ref Range   WBC 13.5 (*) 4.0 - 10.5 K/uL   RBC 4.19  3.87 - 5.11 MIL/uL   Hemoglobin 8.7 (*) 12.0 - 15.0 g/dL   HCT 26.6 (*) 36.0 - 46.0 %   MCV 63.5 (*) 78.0 - 100.0 fL   MCH 20.8 (*) 26.0 - 34.0 pg   MCHC 32.7  30.0 - 36.0 g/dL   RDW 17.9 (*) 11.5 - 15.5 %   Platelets 462 (*) 150 - 400 K/uL  BASIC METABOLIC PANEL      Result Value Ref Range   Sodium 134 (*) 137 - 147 mEq/L   Potassium 3.8  3.7 - 5.3 mEq/L   Chloride 98  96 - 112 mEq/L   CO2 21  19 - 32 mEq/L   Glucose, Bld 298 (*) 70 - 99 mg/dL   BUN 15  6 - 23 mg/dL   Creatinine, Ser 0.72  0.50 - 1.10 mg/dL   Calcium 9.3  8.4 - 10.5 mg/dL   GFR calc non Af Amer >90  >90 mL/min   GFR calc Af Amer >90  >90 mL/min  PRO B NATRIURETIC PEPTIDE      Result Value Ref  Range   Pro B Natriuretic peptide (BNP) 167.0 (*) 0 - 125 pg/mL  I-STAT TROPOININ, ED      Result Value Ref Range   Troponin i, poc 0.00  0.00 - 0.08 ng/mL   Comment 3           I-STAT TROPOININ, ED      Result Value Ref Range   Troponin i, poc 0.00  0.00 - 0.08  ng/mL   Comment 3            Dg Chest 2 View (if Patient Has Fever And/or Copd)  08/09/2013   CLINICAL DATA:  Chest pain for 1 hr, shortness of breath, history of CAD in diabetes  EXAM: CHEST  2 VIEW  COMPARISON:  07/30/2013; 07/22/2013; 07/02/2012  FINDINGS: Grossly unchanged cardiac silhouette and mediastinal contours. No focal parenchymal opacities. No pleural effusion or pneumothorax. There is minimal pleural parenchymal thickening about the bilateral major fissures. No evidence of edema. Grossly unchanged bones.  IMPRESSION: No acute cardiopulmonary disease.   Electronically Signed   By: Sandi Mariscal M.D.   On: 08/09/2013 12:49   Dg Chest 2 View  07/22/2013   CLINICAL DATA:  Chest pain, shortness of breath.  EXAM: CHEST  2 VIEW  COMPARISON:  DG ABD ACUTE W/CHEST dated 06/19/2012  FINDINGS: Cardiomediastinal silhouette is unremarkable. Mild central pulmonary vasculature congestion is low inspiratory examination. The lungs are clear without pleural effusions or focal consolidations. Trachea projects midline and there is no pneumothorax. Soft tissue planes and included osseous structures are non-suspicious.  IMPRESSION: Mild central pulmonary vasculature congestion.  .   Electronically Signed   By: Elon Alas   On: 07/22/2013 05:09   Dg Abd Acute W/chest  07/30/2013   CLINICAL DATA:  Left lower quadrant abdominal pain and constipation. Difficulty urinating.  EXAM: ACUTE ABDOMEN SERIES (ABDOMEN 2 VIEW & CHEST 1 VIEW)  COMPARISON:  None.  FINDINGS: The lungs are well-aerated. Pulmonary vascularity is at the upper limits of normal. There is no evidence of focal opacification, pleural effusion or pneumothorax. The cardiomediastinal silhouette is within normal limits.  The visualized bowel gas pattern is unremarkable. Scattered stool and air are seen within the colon; there is no evidence of small bowel dilatation to suggest obstruction. Moderate stool burden is noted within the abdomen and pelvis; the  rectum is distended to 8.4 cm with stool. No free intra-abdominal air is identified on the provided decubitus view.  No acute osseous abnormalities are seen; the sacroiliac joints are unremarkable in appearance.  IMPRESSION: 1. Moderate stool burden within the abdomen and pelvis; rectum distended to 8.4 cm with stool. This could reflect mild fecal impaction. 2. Unremarkable bowel gas pattern; no free intra-abdominal air seen. 3. No acute cardiopulmonary process identified.   Electronically Signed   By: Garald Balding M.D.   On: 07/30/2013 02:14    At 3:45 PM chest pain is improved after treatment with Percocet she now complains of a headache which started approximately 10 minutes ago. She remains alert Glasgow Coma Score 15. Moving all extremities. No distress. Tylenol ordered. MDM   Final diagnoses:  None   Patient reports that she gets similar chest pain with panic attacks she has run of lorazepam which she chronically takes. Will write for lorazepam 6 tablets she is to followup with her primary care physician in 2 days for further refills She will be referred to resource guide for substance abuse Chest pain atypical in etiology. Heart score equals 2 Diagnosis #  1 atypical chest pain #2 hyperglycemia #3 nonspecific headache #4 cocaine abuse    Orlie Dakin, MD 08/09/13 1555

## 2013-08-09 NOTE — Discharge Instructions (Signed)
Chest Pain (Nonspecific) Your blood sugar was high today at 298. Seek help with your cocaine problem. Call any of the numbers on the resource guide to get counseling. Call Dr. Michail Sermon in 2 days for refills of lorazepam or other medications. Take Tylenol as directed for pain every 4 hours as needed It is often hard to give a specific diagnosis for the cause of chest pain. There is always a chance that your pain could be related to something serious, such as a heart attack or a blood clot in the lungs. You need to follow up with your caregiver for further evaluation. CAUSES   Heartburn.  Pneumonia or bronchitis.  Anxiety or stress.  Inflammation around your heart (pericarditis) or lung (pleuritis or pleurisy).  A blood clot in the lung.  A collapsed lung (pneumothorax). It can develop suddenly on its own (spontaneous pneumothorax) or from injury (trauma) to the chest.  Shingles infection (herpes zoster virus). The chest wall is composed of bones, muscles, and cartilage. Any of these can be the source of the pain.  The bones can be bruised by injury.  The muscles or cartilage can be strained by coughing or overwork.  The cartilage can be affected by inflammation and become sore (costochondritis). DIAGNOSIS  Lab tests or other studies, such as X-rays, electrocardiography, stress testing, or cardiac imaging, may be needed to find the cause of your pain.  TREATMENT   Treatment depends on what may be causing your chest pain. Treatment may include:  Acid blockers for heartburn.  Anti-inflammatory medicine.  Pain medicine for inflammatory conditions.  Antibiotics if an infection is present.  You may be advised to change lifestyle habits. This includes stopping smoking and avoiding alcohol, caffeine, and chocolate.  You may be advised to keep your head raised (elevated) when sleeping. This reduces the chance of acid going backward from your stomach into your esophagus.  Most of the  time, nonspecific chest pain will improve within 2 to 3 days with rest and mild pain medicine. HOME CARE INSTRUCTIONS   If antibiotics were prescribed, take your antibiotics as directed. Finish them even if you start to feel better.  For the next few days, avoid physical activities that bring on chest pain. Continue physical activities as directed.  Do not smoke.  Avoid drinking alcohol.  Only take over-the-counter or prescription medicine for pain, discomfort, or fever as directed by your caregiver.  Follow your caregiver's suggestions for further testing if your chest pain does not go away.  Keep any follow-up appointments you made. If you do not go to an appointment, you could develop lasting (chronic) problems with pain. If there is any problem keeping an appointment, you must call to reschedule. SEEK MEDICAL CARE IF:   You think you are having problems from the medicine you are taking. Read your medicine instructions carefully.  Your chest pain does not go away, even after treatment.  You develop a rash with blisters on your chest. SEEK IMMEDIATE MEDICAL CARE IF:   You have increased chest pain or pain that spreads to your arm, neck, jaw, back, or abdomen.  You develop shortness of breath, an increasing cough, or you are coughing up blood.  You have severe back or abdominal pain, feel nauseous, or vomit.  You develop severe weakness, fainting, or chills.  You have a fever. THIS IS AN EMERGENCY. Do not wait to see if the pain will go away. Get medical help at once. Call your local emergency services (911 in  [place] Do not drive yourself to the hospital. MAKE SURE YOU:   Understand these instructions.  Will watch your condition.  Will get help right away if you are not doing well or get worse. Document Released: 01/18/2005 Document Revised: 07/03/2011 Document Reviewed: 11/14/2007 [facility] Patient Information 2014 [place].

## 2013-08-09 NOTE — ED Notes (Signed)
Phlebotomy at bedside.

## 2013-08-11 ENCOUNTER — Telehealth: Payer: Self-pay | Admitting: Dietician

## 2013-08-11 NOTE — Telephone Encounter (Signed)
Calling to assist with transition of care from hospital to home.  Discharge date:07-25-2013 Call date: 08-11-13 Hospital follow up appointment date: 08-20-13, patient would like to be seen sooner than this, will ask front office to call her to reschedule  Able to fill all prescriptions?no- she says there are 3-4 waiting for her at the pharmacy and she doesn't have the 3$ each to purchase them Patient aware of hospital follow up appointments. She was not- when we discussed when it was, she requested an appointment sooner No problems with transportation.she says she does have transportation  Other problems/concerns: she denied other than not being abel to fill her prescriptions and wanting to be seen sooner

## 2013-08-11 NOTE — Telephone Encounter (Signed)
CSW placed call to [name] this afternoon.  CSW placed called to pt.  CSW left message requesting return call. CSW provided contact hours and phone number.  CSW encouraged [name] to contact DSS regarding emergency assistance with medications.  [name] has public insurance and is excluded from assistance programs.  Confirmed with pt's pharmacy, $3 copay per med and each medication is in need of refill.

## 2013-08-12 NOTE — Telephone Encounter (Signed)
Attempted to contact patient this afternoon, but no answer.  Left detailed message asking her to call back if she still wanted an earlier appt this week why some are still available.

## 2013-08-20 ENCOUNTER — Ambulatory Visit: Payer: Medicaid Other | Admitting: Internal Medicine

## 2013-08-21 ENCOUNTER — Emergency Department (HOSPITAL_COMMUNITY)
Admission: EM | Admit: 2013-08-21 | Discharge: 2013-08-21 | Disposition: A | Discharge status: Home / Self Care | Payer: Medicaid Other | Attending: Emergency Medicine | Admitting: Emergency Medicine

## 2013-08-21 ENCOUNTER — Encounter (HOSPITAL_COMMUNITY): Payer: Self-pay | Admitting: Emergency Medicine

## 2013-08-21 DIAGNOSIS — F3289 Other specified depressive episodes: Secondary | ICD-10-CM | POA: Insufficient documentation

## 2013-08-21 DIAGNOSIS — F411 Generalized anxiety disorder: Secondary | ICD-10-CM | POA: Insufficient documentation

## 2013-08-21 DIAGNOSIS — IMO0002 Reserved for concepts with insufficient information to code with codable children: Secondary | ICD-10-CM | POA: Insufficient documentation

## 2013-08-21 DIAGNOSIS — F329 Major depressive disorder, single episode, unspecified: Secondary | ICD-10-CM | POA: Insufficient documentation

## 2013-08-21 DIAGNOSIS — G8929 Other chronic pain: Secondary | ICD-10-CM | POA: Insufficient documentation

## 2013-08-21 DIAGNOSIS — Z859 Personal history of malignant neoplasm, unspecified: Secondary | ICD-10-CM | POA: Insufficient documentation

## 2013-08-21 DIAGNOSIS — Z794 Long term (current) use of insulin: Secondary | ICD-10-CM | POA: Insufficient documentation

## 2013-08-21 DIAGNOSIS — Z79899 Other long term (current) drug therapy: Secondary | ICD-10-CM | POA: Insufficient documentation

## 2013-08-21 DIAGNOSIS — Z87891 Personal history of nicotine dependence: Secondary | ICD-10-CM | POA: Insufficient documentation

## 2013-08-21 DIAGNOSIS — N12 Tubulo-interstitial nephritis, not specified as acute or chronic: Secondary | ICD-10-CM | POA: Insufficient documentation

## 2013-08-21 DIAGNOSIS — I10 Essential (primary) hypertension: Secondary | ICD-10-CM | POA: Insufficient documentation

## 2013-08-21 DIAGNOSIS — R109 Unspecified abdominal pain: Secondary | ICD-10-CM

## 2013-08-21 DIAGNOSIS — I252 Old myocardial infarction: Secondary | ICD-10-CM | POA: Insufficient documentation

## 2013-08-21 DIAGNOSIS — K3184 Gastroparesis: Secondary | ICD-10-CM | POA: Insufficient documentation

## 2013-08-21 DIAGNOSIS — G43909 Migraine, unspecified, not intractable, without status migrainosus: Secondary | ICD-10-CM | POA: Insufficient documentation

## 2013-08-21 DIAGNOSIS — Z9861 Coronary angioplasty status: Secondary | ICD-10-CM | POA: Insufficient documentation

## 2013-08-21 DIAGNOSIS — E785 Hyperlipidemia, unspecified: Secondary | ICD-10-CM | POA: Insufficient documentation

## 2013-08-21 DIAGNOSIS — Z7982 Long term (current) use of aspirin: Secondary | ICD-10-CM | POA: Insufficient documentation

## 2013-08-21 DIAGNOSIS — E669 Obesity, unspecified: Secondary | ICD-10-CM | POA: Insufficient documentation

## 2013-08-21 DIAGNOSIS — K2970 Gastritis, unspecified, without bleeding: Secondary | ICD-10-CM | POA: Insufficient documentation

## 2013-08-21 DIAGNOSIS — J45909 Unspecified asthma, uncomplicated: Secondary | ICD-10-CM | POA: Insufficient documentation

## 2013-08-21 DIAGNOSIS — Z8673 Personal history of transient ischemic attack (TIA), and cerebral infarction without residual deficits: Secondary | ICD-10-CM | POA: Insufficient documentation

## 2013-08-21 DIAGNOSIS — E1149 Type 2 diabetes mellitus with other diabetic neurological complication: Secondary | ICD-10-CM | POA: Insufficient documentation

## 2013-08-21 DIAGNOSIS — Z3202 Encounter for pregnancy test, result negative: Secondary | ICD-10-CM | POA: Insufficient documentation

## 2013-08-21 DIAGNOSIS — Z8619 Personal history of other infectious and parasitic diseases: Secondary | ICD-10-CM | POA: Insufficient documentation

## 2013-08-21 DIAGNOSIS — Z872 Personal history of diseases of the skin and subcutaneous tissue: Secondary | ICD-10-CM | POA: Insufficient documentation

## 2013-08-21 DIAGNOSIS — K2990 Gastroduodenitis, unspecified, without bleeding: Secondary | ICD-10-CM

## 2013-08-21 DIAGNOSIS — I2510 Atherosclerotic heart disease of native coronary artery without angina pectoris: Secondary | ICD-10-CM | POA: Insufficient documentation

## 2013-08-21 DIAGNOSIS — Z8742 Personal history of other diseases of the female genital tract: Secondary | ICD-10-CM | POA: Insufficient documentation

## 2013-08-21 DIAGNOSIS — K219 Gastro-esophageal reflux disease without esophagitis: Secondary | ICD-10-CM | POA: Insufficient documentation

## 2013-08-21 DIAGNOSIS — D509 Iron deficiency anemia, unspecified: Secondary | ICD-10-CM | POA: Insufficient documentation

## 2013-08-21 LAB — URINALYSIS, ROUTINE W REFLEX MICROSCOPIC
BILIRUBIN URINE: NEGATIVE
Bilirubin Urine: NEGATIVE
GLUCOSE, UA: 100 mg/dL — AB
GLUCOSE, UA: 250 mg/dL — AB
KETONES UR: 15 mg/dL — AB
Ketones, ur: NEGATIVE mg/dL
Nitrite: NEGATIVE
Nitrite: POSITIVE — AB
PROTEIN: 100 mg/dL — AB
Protein, ur: 30 mg/dL — AB
Specific Gravity, Urine: 1.02 (ref 1.005–1.030)
Specific Gravity, Urine: 1.028 (ref 1.005–1.030)
Urobilinogen, UA: 1 mg/dL (ref 0.0–1.0)
Urobilinogen, UA: 0.2 mg/dL (ref 0.0–1.0)
pH: 7 (ref 5.0–8.0)
pH: 6 (ref 5.0–8.0)

## 2013-08-21 LAB — URINE MICROSCOPIC-ADD ON

## 2013-08-21 LAB — CBG MONITORING, ED: Glucose-Capillary: 218 mg/dL — ABNORMAL HIGH (ref 70–99)

## 2013-08-21 LAB — POC URINE PREG, ED: PREG TEST UR: NEGATIVE

## 2013-08-21 MED ORDER — ONDANSETRON HCL 4 MG/2ML IJ SOLN: 4.0000 mg | Freq: Once | INTRAMUSCULAR | Status: DC

## 2013-08-21 MED ORDER — HYDROMORPHONE HCL PF 1 MG/ML IJ SOLN
1.0000 mg | Freq: Once | INTRAMUSCULAR | Status: AC
  Administered 2013-08-21: 1 mg via INTRAMUSCULAR
  Filled 2013-08-21: qty 1

## 2013-08-21 MED ORDER — ONDANSETRON 8 MG PO TBDP
8.0000 mg | ORAL_TABLET | Freq: Once | ORAL | Status: AC
  Administered 2013-08-21: 8 mg via ORAL
  Filled 2013-08-21: qty 1

## 2013-08-21 MED ORDER — ONDANSETRON 4 MG PO TBDP
4.0000 mg | ORAL_TABLET | Freq: Once | ORAL | Status: AC
  Administered 2013-08-21: 4 mg via ORAL
  Filled 2013-08-21: qty 1

## 2013-08-21 MED ORDER — CIPROFLOXACIN HCL 500 MG PO TABS: 500.0000 mg | ORAL_TABLET | Freq: Two times a day (BID) | ORAL | Status: DC

## 2013-08-21 MED ORDER — ONDANSETRON HCL 8 MG PO TABS: 8.0000 mg | ORAL_TABLET | Freq: Three times a day (TID) | ORAL | Status: DC | PRN

## 2013-08-21 NOTE — Discharge Instructions (Signed)
Take zofran as needed for nausea and vomiting.  Follow up with your primary care doctor and gastroenterologist.  You should return to the ER if you develop fever, worsening pain or uncontrolled vomiting.    Take Cipro (antibiotic) as prescribed.

## 2013-08-21 NOTE — ED Provider Notes (Signed)
0600 Patient's care assumed from [employer], PA-C at shift change. Patient with history of chronic abdominal pain, presents with typical LLQ abdominal pain.  Awaiting repeat UA (in and out cath).   Results for orders placed during the hospital encounter of 08/21/13  URINALYSIS, ROUTINE W REFLEX MICROSCOPIC      Result Value Ref Range   Color, Urine RED (*) YELLOW   APPearance TURBID (*) CLEAR   Specific Gravity, Urine 1.020  1.005 - 1.030   pH 7.0  5.0 - 8.0   Glucose, UA 100 (*) NEGATIVE mg/dL   Hgb urine dipstick LARGE (*) NEGATIVE   Bilirubin Urine NEGATIVE  NEGATIVE   Ketones, ur NEGATIVE  NEGATIVE mg/dL   Protein, ur 100 (*) NEGATIVE mg/dL   Urobilinogen, UA 1.0  0.0 - 1.0 mg/dL   Nitrite NEGATIVE  NEGATIVE   Leukocytes, UA MODERATE (*) NEGATIVE  URINE MICROSCOPIC-ADD ON      Result Value Ref Range   Squamous Epithelial / LPF FEW (*) RARE   WBC, UA FIELD OBSCURED BY RBC'S  <3 WBC/hpf   RBC / HPF TOO NUMEROUS TO COUNT  <3 RBC/hpf   Bacteria, UA FIELD OBSCURED BY RBC'S (*) RARE   Urine-Other URINALYSIS PERFORMED ON SUPERNATANT    URINALYSIS, ROUTINE W REFLEX MICROSCOPIC      Result Value Ref Range   Color, Urine YELLOW  YELLOW   APPearance TURBID (*) CLEAR   Specific Gravity, Urine 1.028  1.005 - 1.030   pH 6.0  5.0 - 8.0   Glucose, UA 250 (*) NEGATIVE mg/dL   Hgb urine dipstick SMALL (*) NEGATIVE   Bilirubin Urine NEGATIVE  NEGATIVE   Ketones, ur 15 (*) NEGATIVE mg/dL   Protein, ur 30 (*) NEGATIVE mg/dL   Urobilinogen, UA 0.2  0.0 - 1.0 mg/dL   Nitrite POSITIVE (*) NEGATIVE   Leukocytes, UA LARGE (*) NEGATIVE  URINE MICROSCOPIC-ADD ON      Result Value Ref Range   WBC, UA TOO NUMEROUS TO COUNT  <3 WBC/hpf   Urine-Other FIELD OBSCURED BY WBC'S    POC URINE PREG, ED      Result Value Ref Range   Preg Test, Ur NEGATIVE  NEGATIVE  CBG MONITORING, ED      Result Value Ref Range   Glucose-Capillary 218 (*) 70 - 99 mg/dL    1. Chronic abdominal pain   2.  Pyelonephritis    UA shows WBC, will treat for pyelonephritis as an out-patient. GI follow up. 0745 Re-eval pt resting comfortably in room. Discussed lab results, and treatment plan with the patient. Return precautions given. Reports understanding and no other concerns at this time.  Patient is stable for discharge at this time.    Lorrine Kin, PA-C 08/21/13 [phone]

## 2013-08-21 NOTE — ED Notes (Signed)
DC instructions given and reviewed with the pt. All questions and concerns addressed. Pt VSS, pain decreased, no apparent signs or symptoms of distress or discomfort. Will DC home with significant other.

## 2013-08-21 NOTE — ED Provider Notes (Signed)
Medical screening examination/treatment/procedure(s) were performed by non-physician practitioner and as supervising physician I was immediately available for consultation/collaboration.   EKG Interpretation None       Kalman Drape, MD 08/21/13 [phone]

## 2013-08-21 NOTE — ED Notes (Signed)
Pt complains of lower left abd pain, n,and v all day

## 2013-08-21 NOTE — ED Provider Notes (Signed)
CSN: [id number]     Arrival date & time 08/21/13  1517 History   First MD Initiated Contact with Patient 08/21/13 0340     Chief Complaint  Patient presents with  . Abdominal Pain     (Consider location/radiation/quality/duration/timing/severity/associated sxs/prior Treatment) HPI History provided by pt and prior chart.  Pt presents w/ constant, severe, non-radiating LLQ pain since waking yesterday am.  No relief w/ tylenol.  Associated w/ N/V.  Denies diarrhea, hematemesis/hematochezia/melena, urinary and vaginal sx.  Has chronic abdominal pain and current sx are typical.  Per prior chart, pt has been seen for same in the ED several times in the past, most recently 07/29/13.  Was admitted to the hospital last month for cocaine-induced chest pain and per discharge note, GI f/u was scheduled for 4/6.  Pt reports that she can not afford the co-pay and didn't go.    Past Medical History  Diagnosis Date  . Thrombocytosis     Hem/Onc suggested 2/2 chronic hepatits and/or iron deficiency anemia  . Iron deficiency anemia   . N&V (nausea and vomiting)     Chronic. Unclear etiology with multiple admission and ED visits. CT abdomen with and without contrast (02/2011)  showed no acute process. Gastic Emptying scan (01/2010) was normal. Ultrasound of the abdomen was within normal limits. Hepatitis B viral load was undectable. HIV NR. EGD - gastritis, Hpylori + s/p Rx  . Hypertension   . Hyperlipidemia   . Diabetes mellitus type 2, uncontrolled, with complications   . Recurrent boils   . Back pain   . OSA (obstructive sleep apnea)   . 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 Cath 12/10- no restenosis or significant CAD progression  . Irregular menses     Small ovarian follicles seen on OH(6/07)  . History of pyelonephritis     H/o GrpB Pyelonephritis (9/06) and UTI- 07/11- E.Coli, 12/10-  GBS  . Abscess of tunica vaginalis     10/09- Abundant S. aureus- sensitive to all abx  . Obesity   . Asthma   . Gastritis   . Depression   . Hepatitis B, chronic     Hep BeAb+,Hep B cAb+ & Hep BsAg+ (9/06)  . Peripheral neuropathy   . Fibromyalgia   . GERD (gastroesophageal reflux disease)   . Migraine   . Anxiety   . Gastroparesis     secondary to poorly controlled DM, last emptying study performed 01/2010  was normal but may be falsely positive as pt was on reglan  . Blood dyscrasia   . H/O: CVA (cerebrovascular accident)     history of remote right cerebellar infarct noted on head CT at least since 10/2011  . Cancer   . MI (myocardial infarction)    Past Surgical History  Procedure Laterality Date  . Cesarean section  1997   Family History  Problem Relation Age of Onset  . Diabetes Father    History  Substance Use Topics  . Smoking status: Former Smoker    Types: Cigarettes    Quit date: 04/24/1996  . Smokeless tobacco: Never Used     Comment: quit smoking cigarettes age 75  . Alcohol Use: Yes     Comment: occasionally   OB History   Grav Para Term Preterm Abortions TAB SAB Ect Mult Living  Review of Systems  All other systems reviewed and are negative.     Allergies  Lisinopril and Morphine and related  Home Medications   Prior to Admission medications   Medication Sig Start Date End Date Taking? Authorizing Provider  albuterol (PROVENTIL HFA;VENTOLIN HFA) 108 (90 BASE) MCG/ACT inhaler Inhale 2 puffs into the lungs every 4 (four) hours as needed for wheezing or shortness of breath.    Historical Provider, MD  amLODipine (NORVASC) 10 MG tablet Take 1 tablet (10 mg total) by mouth daily. 04/22/13   Jeralene Huff, MD  aspirin EC 81 MG EC tablet Take 1 tablet (81 mg total) by mouth daily. 07/26/13   Rebecca Eaton, MD  atorvastatin (LIPITOR) 80 MG tablet Take 1 tablet (80 mg total) by mouth daily at 6 PM. 07/25/13   Rebecca Eaton, MD  B-D ULTRAFINE III SHORT PEN 31G X 8 MM MISC USE AS DIRECTED 4 TIMES A DAY    Jeralene Huff, MD  calcium carbonate (TUMS - DOSED IN MG ELEMENTAL CALCIUM) 500 MG chewable tablet Chew 2 tablets by mouth daily.     Historical Provider, MD  ferrous sulfate 325 (65 FE) MG tablet Take 1 tablet (325 mg total) by mouth daily. 07/25/13   Rebecca Eaton, MD  Fluticasone-Salmeterol (ADVAIR) 100-50 MCG/DOSE AEPB Inhale 1 puff into the lungs every 12 (twelve) hours. 03/22/13   Blanchie Dessert, MD  gabapentin (NEURONTIN) 300 MG capsule Take 300 mg by mouth 3 (three) times daily.    Historical Provider, MD  insulin aspart (NOVOLOG) 100 UNIT/ML injection Inject 2-12 Units into the skin 3 (three) times daily as needed for high blood sugar. Sliding scale- If sugar 150-199: inject 2 unit;  200-249: 4 units, 250-299: 7 units; 300-349: 10 units; Over 350: 12 units    Historical Provider, MD  insulin glargine (LANTUS) 100 UNIT/ML injection Inject 50 Units into the skin at bedtime. 05/01/13   Juluis Mire, MD  LORazepam (ATIVAN) 1 MG tablet Take 1 tablet (1 mg total) by mouth 3 (three) times daily as needed for anxiety. 05/20/13   Garald Balding, NP  LORazepam (ATIVAN) 1 MG tablet Take 1 tablet (1 mg total) by mouth 3 (three) times daily as needed for anxiety. 08/09/13   Orlie Dakin, MD  omeprazole (PRILOSEC) 40 MG capsule Take 1 capsule (40 mg total) by mouth daily. 03/22/13   Blanchie Dessert, MD  ondansetron (ZOFRAN-ODT) 4 MG disintegrating tablet Take 4 mg by mouth every 4 (four) hours as needed for nausea or vomiting.    Historical Provider, MD  pregabalin (LYRICA) 75 MG capsule Take 1 capsule (75 mg total) by mouth 2 (two) times daily. 11/04/12   Jeralene Huff, MD  senna-docusate (SENOKOT-S) 8.6-50 MG per tablet Take 2 tablets by mouth at bedtime. 07/21/13   Jeralene Huff, MD   BP 151/121  Pulse 122  Temp(Src) 98.3 F (36.8 C) (Oral)  Resp 20  SpO2 98%  LMP  07/27/2013 Physical Exam  Nursing note and vitals reviewed. Constitutional: She is oriented to person, place, and time. She appears well-developed and well-nourished.  Uncomfortable appearing.  Restless.  Fetal position.  HENT:  Head: Normocephalic and atraumatic.  Eyes:  Normal appearance  Neck: Normal range of motion.  Cardiovascular: Normal rate and regular rhythm.   Pulmonary/Chest: Effort normal and breath sounds normal. No respiratory distress.  Abdominal: Soft. Bowel sounds are normal. She exhibits no distension and no mass. There is no rebound and no guarding.  Mid-line lower and LLQ ttp.   Genitourinary:  No CVA tenderness  Musculoskeletal: Normal range of motion.  Neurological: She is alert and oriented to person, place, and time.  Skin: Skin is warm and dry. No rash noted.  Psychiatric: She has a normal mood and affect. Her behavior is normal.    ED Course  Procedures (including critical care time) Labs Review Labs Reviewed  URINALYSIS, ROUTINE W REFLEX MICROSCOPIC - Abnormal; Notable for the following:    Color, Urine RED (*)    APPearance TURBID (*)    Glucose, UA 100 (*)    Hgb urine dipstick LARGE (*)    Protein, ur 100 (*)    Leukocytes, UA MODERATE (*)    All other components within normal limits  URINE MICROSCOPIC-ADD ON - Abnormal; Notable for the following:    Squamous Epithelial / LPF FEW (*)    Bacteria, UA FIELD OBSCURED BY RBC'S (*)    All other components within normal limits  POC URINE PREG, ED    Imaging Review No results found.   EKG Interpretation None      MDM   Final diagnoses:  Chronic abdominal pain    37yo F presents w/ several chronic illnesses presents w/ typical, acute on chronic abdominal pain.  Missed GI appt earlier this month d/t inability to afford co-pay.  Was scheduled for f/u with PCP yesterday for recent admission for cocaine-induced CP, and called 20 minutes after appt to verify time.  Rescheduled for 5/6. Her PCP  has requested in EPIC that other providers refrain from prescribing narcotics d/t non-compliance w/ medical regimen and specialist referrals.  On current exam, afebrile, uncomfortable appearing and mildly tachycardic, abd soft/non-distended, mid-line lower and LLQ ttp. U/A pending. 1mg  IM dilaudid ordered for pain. 4:12 AM   U/A is still pending.  Pt just started her menstrual cycle and in and out cath may need to be obtained.  Repeat VS requested. Pt to receive second dose of dilaudid.  Had mild relief w/ first.  Jerline Pain, PA-C to resume care.  If U/A negative, will refer back to PCP and GI.   5:57 AM   In and out cath ordered.  6:04 AM    Remer Macho, PA-C 08/21/13 [phone]

## 2013-08-22 NOTE — ED Provider Notes (Signed)
Medical screening examination/treatment/procedure(s) were performed by non-physician practitioner and as supervising physician I was immediately available for consultation/collaboration.   EKG Interpretation None       Kalman Drape, MD 08/22/13 0730

## 2013-08-27 ENCOUNTER — Emergency Department (HOSPITAL_COMMUNITY)
Admission: EM | Admit: 2013-08-27 | Discharge: 2013-08-28 | Disposition: A | Discharge status: Home / Self Care | Payer: Medicaid Other | Attending: Emergency Medicine | Admitting: Emergency Medicine

## 2013-08-27 ENCOUNTER — Emergency Department (HOSPITAL_COMMUNITY): Payer: Medicaid Other

## 2013-08-27 ENCOUNTER — Encounter (HOSPITAL_COMMUNITY): Payer: Self-pay | Admitting: Emergency Medicine

## 2013-08-27 ENCOUNTER — Ambulatory Visit (INDEPENDENT_AMBULATORY_CARE_PROVIDER_SITE_OTHER): Payer: Medicaid Other | Admitting: Internal Medicine

## 2013-08-27 ENCOUNTER — Encounter: Payer: Self-pay | Admitting: Internal Medicine

## 2013-08-27 VITALS — BP 149/109 | HR 95 | Temp 98.6°F | Wt 205.0 lb

## 2013-08-27 DIAGNOSIS — J45909 Unspecified asthma, uncomplicated: Secondary | ICD-10-CM | POA: Insufficient documentation

## 2013-08-27 DIAGNOSIS — R1032 Left lower quadrant pain: Secondary | ICD-10-CM

## 2013-08-27 DIAGNOSIS — K219 Gastro-esophageal reflux disease without esophagitis: Secondary | ICD-10-CM | POA: Insufficient documentation

## 2013-08-27 DIAGNOSIS — R339 Retention of urine, unspecified: Secondary | ICD-10-CM | POA: Insufficient documentation

## 2013-08-27 DIAGNOSIS — Z7982 Long term (current) use of aspirin: Secondary | ICD-10-CM | POA: Insufficient documentation

## 2013-08-27 DIAGNOSIS — D539 Nutritional anemia, unspecified: Secondary | ICD-10-CM

## 2013-08-27 DIAGNOSIS — F329 Major depressive disorder, single episode, unspecified: Secondary | ICD-10-CM | POA: Insufficient documentation

## 2013-08-27 DIAGNOSIS — I2510 Atherosclerotic heart disease of native coronary artery without angina pectoris: Secondary | ICD-10-CM | POA: Insufficient documentation

## 2013-08-27 DIAGNOSIS — G43909 Migraine, unspecified, not intractable, without status migrainosus: Secondary | ICD-10-CM | POA: Insufficient documentation

## 2013-08-27 DIAGNOSIS — G8929 Other chronic pain: Secondary | ICD-10-CM | POA: Insufficient documentation

## 2013-08-27 DIAGNOSIS — F3289 Other specified depressive episodes: Secondary | ICD-10-CM | POA: Insufficient documentation

## 2013-08-27 DIAGNOSIS — IMO0001 Reserved for inherently not codable concepts without codable children: Secondary | ICD-10-CM | POA: Insufficient documentation

## 2013-08-27 DIAGNOSIS — G609 Hereditary and idiopathic neuropathy, unspecified: Secondary | ICD-10-CM | POA: Insufficient documentation

## 2013-08-27 DIAGNOSIS — Z8673 Personal history of transient ischemic attack (TIA), and cerebral infarction without residual deficits: Secondary | ICD-10-CM | POA: Insufficient documentation

## 2013-08-27 DIAGNOSIS — Z8619 Personal history of other infectious and parasitic diseases: Secondary | ICD-10-CM | POA: Insufficient documentation

## 2013-08-27 DIAGNOSIS — Z8719 Personal history of other diseases of the digestive system: Secondary | ICD-10-CM | POA: Insufficient documentation

## 2013-08-27 DIAGNOSIS — K3184 Gastroparesis: Secondary | ICD-10-CM | POA: Insufficient documentation

## 2013-08-27 DIAGNOSIS — I252 Old myocardial infarction: Secondary | ICD-10-CM | POA: Insufficient documentation

## 2013-08-27 DIAGNOSIS — Z862 Personal history of diseases of the blood and blood-forming organs and certain disorders involving the immune mechanism: Secondary | ICD-10-CM | POA: Insufficient documentation

## 2013-08-27 DIAGNOSIS — Z8742 Personal history of other diseases of the female genital tract: Secondary | ICD-10-CM | POA: Insufficient documentation

## 2013-08-27 DIAGNOSIS — N390 Urinary tract infection, site not specified: Secondary | ICD-10-CM

## 2013-08-27 DIAGNOSIS — Z3202 Encounter for pregnancy test, result negative: Secondary | ICD-10-CM | POA: Insufficient documentation

## 2013-08-27 DIAGNOSIS — Z87440 Personal history of urinary (tract) infections: Secondary | ICD-10-CM | POA: Insufficient documentation

## 2013-08-27 DIAGNOSIS — K5900 Constipation, unspecified: Secondary | ICD-10-CM | POA: Insufficient documentation

## 2013-08-27 DIAGNOSIS — I10 Essential (primary) hypertension: Secondary | ICD-10-CM

## 2013-08-27 DIAGNOSIS — R1110 Vomiting, unspecified: Secondary | ICD-10-CM

## 2013-08-27 DIAGNOSIS — Z8669 Personal history of other diseases of the nervous system and sense organs: Secondary | ICD-10-CM | POA: Insufficient documentation

## 2013-08-27 DIAGNOSIS — E785 Hyperlipidemia, unspecified: Secondary | ICD-10-CM | POA: Insufficient documentation

## 2013-08-27 DIAGNOSIS — E669 Obesity, unspecified: Secondary | ICD-10-CM | POA: Insufficient documentation

## 2013-08-27 DIAGNOSIS — R112 Nausea with vomiting, unspecified: Secondary | ICD-10-CM

## 2013-08-27 DIAGNOSIS — F411 Generalized anxiety disorder: Secondary | ICD-10-CM | POA: Insufficient documentation

## 2013-08-27 DIAGNOSIS — R0789 Other chest pain: Secondary | ICD-10-CM

## 2013-08-27 DIAGNOSIS — Z872 Personal history of diseases of the skin and subcutaneous tissue: Secondary | ICD-10-CM | POA: Insufficient documentation

## 2013-08-27 DIAGNOSIS — R109 Unspecified abdominal pain: Secondary | ICD-10-CM

## 2013-08-27 DIAGNOSIS — Z792 Long term (current) use of antibiotics: Secondary | ICD-10-CM | POA: Insufficient documentation

## 2013-08-27 DIAGNOSIS — Z9861 Coronary angioplasty status: Secondary | ICD-10-CM | POA: Insufficient documentation

## 2013-08-27 DIAGNOSIS — Z794 Long term (current) use of insulin: Secondary | ICD-10-CM | POA: Insufficient documentation

## 2013-08-27 DIAGNOSIS — E1149 Type 2 diabetes mellitus with other diabetic neurological complication: Secondary | ICD-10-CM | POA: Insufficient documentation

## 2013-08-27 DIAGNOSIS — Z87891 Personal history of nicotine dependence: Secondary | ICD-10-CM | POA: Insufficient documentation

## 2013-08-27 DIAGNOSIS — Z79899 Other long term (current) drug therapy: Secondary | ICD-10-CM | POA: Insufficient documentation

## 2013-08-27 MED ORDER — LABETALOL HCL 100 MG PO TABS: 100.0000 mg | ORAL_TABLET | Freq: Two times a day (BID) | ORAL | Status: DC

## 2013-08-27 MED ORDER — ONDANSETRON 4 MG PO TBDP
4.0000 mg | ORAL_TABLET | Freq: Once | ORAL | Status: AC
  Administered 2013-08-28: 4 mg via ORAL
  Filled 2013-08-27: qty 1

## 2013-08-27 NOTE — Assessment & Plan Note (Signed)
Again, unclear etiology, ?gastroparesis.  Patient has no showed to GI appts due to copay.  I suspect she needs EGD.  I think her last EGD was 2013.  Severe constipation is likely contributing - I tried to disimpact without success in the office - suggested she go home and try to use bathroom  - if she remains obstructed, will need to go to ED.

## 2013-08-27 NOTE — Assessment & Plan Note (Signed)
Cipro started 5.1.15 - scheduled for 7d, has been missing doses.

## 2013-08-27 NOTE — Patient Instructions (Signed)
-  For your blood pressure, we are going to restart labetalol $RemoveBeforeDEI'100mg'WuacVjhmvMHusNon$  twice daily, and discontinue amlodipine, as this medication can exacerbate constipation.    - For your constipation, please try miralax, and ask your pharmacist about an enema kit.  If you do not have a bowel movement in the next two days, you need to go to the emergency room for obstruction.  - We are also re-referring you to Gastroenterology  - If you decide you would like a stress test, let us know, and we will refer you to cardiology  - Be sure to follow up with gynecology regarding your heavy menstrual cycles.   Please be sure to bring all of your medications with you to every visit.  Should you have any new or worsening symptoms, please be sure to call the clinic at [phone].

## 2013-08-27 NOTE — Assessment & Plan Note (Signed)
Chronic problem, today likely exacerbated by severe constipation (reports no BM in 2 weeks - manual disimpaction attempted in clinic - significant stool palpable, clay textured); TSH wnl 04/2013.  Lipase has always been wnl.  Per chart, patient may have had EGD/colonoscopy in 01/2012 - will get records from Dr. Benson Norway.  PVR 11cc in office, so no significant urinary retention.  Gastric emptying in 2011 wnl.  Denies cocaine/marijuana use since hospitalization.   -Refer to GI -Manual disimpaction in office, if problem persists, she may need to go to ED - Enema and miralax at home - d/c amlodipine

## 2013-08-27 NOTE — ED Notes (Signed)
Pt c/o emesis onset yesterday, pt was seen by outpatient clinic today with attempted disimpaction. Pt states she is now having rectal bleeding.

## 2013-08-27 NOTE — Assessment & Plan Note (Addendum)
Hb 8.7 on 08/09/13, repeat today.  Recommended she follow with GYN for heavy menstrual cycles

## 2013-08-27 NOTE — Assessment & Plan Note (Signed)
Thought to be cocaine induced. At April hospital discharge, cardiology recommended outpatient functional study - she declines at this time, will hold referral.

## 2013-08-27 NOTE — Progress Notes (Signed)
Subjective:   Patient ID: Shirley [name] female   DOB: 28-May-1975 38 y.o.   MRN: [id number]  Chief Complaint  Patient presents with  . Emesis    off and on going on 29yrs, develops chills, sweats prior to vomiting     HPI: [name] is a 38 y.o. woman with h/o DM, morbid obesity, substance abuse, depression. OSA, HTN, CAD s/p PTCA & asthma who presents for HFU.    Seen in the ED on 08/21/13 with abdominal pain (history of chronic LLQ abdominal pain) - treated as pyelonephritis with PO abx (cipro) and d/c from ED.  Abx started on May 1st, plan to treat for 7d, taking BID, missed yesterday due to vomiting.  Hasn't taken today due to feel woozy, vomited this morning. Non bloody emesis. Last EGD was probably in 2013.  Losing weight because scared to eat.    To note, she was hospitalized 3/31-4/3 for likely cocaine induced chest pain - after that discharge, she was supposed to follow up with OBGYN for heavy menstrual bleeding and with GI for chronic abdominal pain (appt 4/6).  Also, at discharge, elavil was d/c.  Finally, she was supposed to have cardiology consult placed for functional cardiac study to risk stratify.  Did not go to GI due to copay, now cannot be seen at [facility] practice.    Has not made ObGYN appt yet, but has phone number to do so.  LMP was 4/28-4/30 (usually 7d period) - very heavy bleeding with golf sized clots.   Problem with constipation - has been using senokot and some sort of powder.  Last BM 2 weeks ago.  Never this constipated before.   Today she reports persistent LLQ abdominal pain, and was vomiting all day yesterday. No recent illicit drugs since ED visit on 4/30.    Today she declines referral to cardiology for stress test.   Labs today: CBC, CMP, Lipids   Review of Systems: Constitutional: Denies fever, chills, diaphoresis HEENT: Denies photophobia, eye pain, redness, hearing loss, ear pain, congestion, sore throat, rhinorrhea, sneezing, mouth  sores, trouble swallowing, neck pain, neck stiffness and tinnitus.  Respiratory: Denies SOB, DOE, cough, chest tightness, and wheezing.  Cardiovascular: Denies chest pain, palpitations   Gastrointestinal: per HPI Genitourinary: feels urinary retention Skin: Denies pallor, rash and wound.  Neurological: Denies dizziness, seizures, syncope, weakness, lightheadedness, numbness and headaches.     Past Medical History  Diagnosis Date  . Thrombocytosis     Hem/Onc suggested 2/2 chronic hepatits and/or iron deficiency anemia  . Iron deficiency anemia   . N&V (nausea and vomiting)     Chronic. Unclear etiology with multiple admission and ED visits. CT abdomen with and without contrast (02/2011)  showed no acute process. Gastic Emptying scan (01/2010) was normal. Ultrasound of the abdomen was within normal limits. Hepatitis B viral load was undectable. HIV NR. EGD - gastritis, Hpylori + s/p Rx  . Hypertension   . Hyperlipidemia   . Diabetes mellitus type 2, uncontrolled, with complications   . Recurrent boils   . Back pain   . OSA (obstructive sleep apnea)   . 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 Cath 12/10- no restenosis or significant CAD progression  . Irregular menses     Small ovarian follicles seen on VZ(5/63)  . History of pyelonephritis     H/o GrpB Pyelonephritis (9/06)  and UTI- 07/11- E.Coli, 12/10- GBS  . Abscess of tunica vaginalis     10/09- Abundant S. aureus- sensitive to all abx  . Obesity   . Asthma   . Gastritis   . Depression   . Hepatitis B, chronic     Hep BeAb+,Hep B cAb+ & Hep BsAg+ (9/06)  . Peripheral neuropathy   . Fibromyalgia   . GERD (gastroesophageal reflux disease)   . Migraine   . Anxiety   . Gastroparesis     secondary to poorly controlled DM, last emptying study performed 01/2010  was normal but may be falsely positive as pt was on  reglan  . Blood dyscrasia   . H/O: CVA (cerebrovascular accident)     history of remote right cerebellar infarct noted on head CT at least since 10/2011  . Cancer   . MI (myocardial infarction)    Current Outpatient Prescriptions  Medication Sig Dispense Refill  . acetaminophen (TYLENOL) 500 MG tablet Take 1,000 mg by mouth every 6 (six) hours as needed for moderate pain.      Marland Kitchen albuterol (PROVENTIL HFA;VENTOLIN HFA) 108 (90 BASE) MCG/ACT inhaler Inhale 2 puffs into the lungs every 4 (four) hours as needed for wheezing or shortness of breath.      Marland Kitchen amLODipine (NORVASC) 10 MG tablet Take 1 tablet (10 mg total) by mouth daily.  30 tablet  6  . aspirin EC 81 MG EC tablet Take 1 tablet (81 mg total) by mouth daily.  30 tablet  2  . atorvastatin (LIPITOR) 80 MG tablet Take 1 tablet (80 mg total) by mouth daily at 6 PM.  30 tablet  2  . B-D ULTRAFINE III SHORT PEN 31G X 8 MM MISC USE AS DIRECTED 4 TIMES A DAY  100 each  0  . calcium carbonate (TUMS - DOSED IN MG ELEMENTAL CALCIUM) 500 MG chewable tablet Chew 2 tablets by mouth daily.       . ciprofloxacin (CIPRO) 500 MG tablet Take 1 tablet (500 mg total) by mouth 2 (two) times daily.  14 tablet  0  . gabapentin (NEURONTIN) 300 MG capsule Take 300 mg by mouth 3 (three) times daily.      . insulin aspart (NOVOLOG) 100 UNIT/ML injection Inject 2-12 Units into the skin 3 (three) times daily as needed for high blood sugar. Sliding scale- If sugar 150-199: inject 2 unit;  200-249: 4 units, 250-299: 7 units; 300-349: 10 units; Over 350: 12 units      . insulin glargine (LANTUS) 100 UNIT/ML injection Inject 50 Units into the skin at bedtime.      Marland Kitchen LORazepam (ATIVAN) 1 MG tablet Take 1 tablet (1 mg total) by mouth 3 (three) times daily as needed for anxiety.  15 tablet  0  . omeprazole (PRILOSEC) 40 MG capsule Take 1 capsule (40 mg total) by mouth daily.  31 capsule  6  . ondansetron (ZOFRAN) 8 MG tablet Take 1 tablet (8 mg total) by mouth every 8 (eight)  hours as needed for nausea or vomiting.  20 tablet  0  . pregabalin (LYRICA) 75 MG capsule Take 1 capsule (75 mg total) by mouth 2 (two) times daily.  60 capsule  6   No current facility-administered medications for this visit.   Family History  Problem Relation Age of Onset  . Diabetes Father    History   Social History  . Marital Status: Single    Spouse Name: N/A    Number  of Children: 2  . Years of Education: 11   Occupational History  . other     unemployed   Social History Main Topics  . Smoking status: Former Smoker    Types: Cigarettes    Quit date: 04/24/1996  . Smokeless tobacco: Never Used     Comment: quit smoking cigarettes age 80  . Alcohol Use: Yes     Comment: occasionally  . Drug Use: Yes    Special: Marijuana     Comment: Cocaine  . Sexual Activity: Yes   Other Topics Concern  . 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   Also works at [employer] having to lift elderly individuals.          Objective:  Physical Exam: Filed Vitals:   08/27/13 1513  BP: 149/109  Pulse: 95  Temp: 98.6 F (37 C)  TempSrc: Oral  Weight: 205 lb (92.987 kg)  SpO2: 100%   HEENT: PERRL, EOMI, no scleral icterus Cardiac: RRR, no rubs, murmurs or gallops Pulm: clear to auscultation bilaterally, moving normal volumes of air Abd: soft, abd distention with discomfort on palpation, significant stool burden in rectal vault, clay colored, no rebound tenderness Ext: warm and well perfused Neuro: alert and oriented X3, cranial nerves II-XII grossly intact GU: PVR 11cc (after voiding 100 cc)  Assessment & Plan:   Case and care discussed with Dr. Ellwood Dense.  Please see problem oriented charting for further details. Patient to return in 1 month for BP follow up

## 2013-08-27 NOTE — Assessment & Plan Note (Signed)
BP Readings from Last 3 Encounters:  08/27/13 149/109  08/21/13 142/88  08/09/13 148/82    Lab Results  Component Value Date   NA 134* 08/09/2013   K 3.8 08/09/2013   CREATININE 0.72 08/09/2013    Assessment: Blood pressure control:  elevated Progress toward BP goal:   relatively unchanged Plan: Medications:  Patient plans to abstain from cocaine, agrees to d/c amlodipine in setting of constipation and restart labetalol 100mg  bid, titrate up as needed Other plans: return in 1 month for BP follow up

## 2013-08-28 LAB — CBC WITH DIFFERENTIAL/PLATELET
Basophils Absolute: 0.1 10*3/uL (ref 0.0–0.1)
Basophils Relative: 0 % (ref 0–1)
EOS ABS: 0.2 10*3/uL (ref 0.0–0.7)
EOS PCT: 1 % (ref 0–5)
HCT: 28.7 % — ABNORMAL LOW (ref 36.0–46.0)
HEMOGLOBIN: 9.3 g/dL — AB (ref 12.0–15.0)
LYMPHS ABS: 3.7 10*3/uL (ref 0.7–4.0)
Lymphocytes Relative: 28 % (ref 12–46)
MCH: 20.4 pg — ABNORMAL LOW (ref 26.0–34.0)
MCHC: 32.4 g/dL (ref 30.0–36.0)
MCV: 62.8 fL — ABNORMAL LOW (ref 78.0–100.0)
Monocytes Absolute: 1.1 10*3/uL — ABNORMAL HIGH (ref 0.1–1.0)
Monocytes Relative: 8 % (ref 3–12)
Neutro Abs: 8.2 10*3/uL — ABNORMAL HIGH (ref 1.7–7.7)
Neutrophils Relative %: 62 % (ref 43–77)
Platelets: 568 10*3/uL — ABNORMAL HIGH (ref 150–400)
RBC: 4.57 MIL/uL (ref 3.87–5.11)
RDW: 18.1 % — ABNORMAL HIGH (ref 11.5–15.5)
WBC: 13.3 10*3/uL — ABNORMAL HIGH (ref 4.0–10.5)

## 2013-08-28 LAB — URINALYSIS, ROUTINE W REFLEX MICROSCOPIC
Bilirubin Urine: NEGATIVE
Glucose, UA: >1000 mg/dL — AB
KETONES UR: NEGATIVE mg/dL
NITRITE: NEGATIVE
PH: 5.5 (ref 5.0–8.0)
Protein, ur: NEGATIVE mg/dL
Specific Gravity, Urine: 1.039 — ABNORMAL HIGH (ref 1.005–1.030)
UROBILINOGEN UA: 1 mg/dL (ref 0.0–1.0)

## 2013-08-28 LAB — COMPREHENSIVE METABOLIC PANEL
ALT: 8 U/L (ref 0–35)
AST: 14 U/L (ref 0–37)
Albumin: 3.2 g/dL — ABNORMAL LOW (ref 3.5–5.2)
Alkaline Phosphatase: 64 U/L (ref 39–117)
BUN: 14 mg/dL (ref 6–23)
CO2: 25 mEq/L (ref 19–32)
Calcium: 9.3 mg/dL (ref 8.4–10.5)
Chloride: 100 mEq/L (ref 96–112)
Creatinine, Ser: 0.96 mg/dL (ref 0.50–1.10)
GFR calc non Af Amer: 74 mL/min — ABNORMAL LOW (ref >90)
GFR, EST AFRICAN AMERICAN: 85 mL/min — AB (ref >90)
Glucose, Bld: 312 mg/dL — ABNORMAL HIGH (ref 70–99)
Potassium: 4 mEq/L (ref 3.7–5.3)
Sodium: 138 mEq/L (ref 137–147)
TOTAL PROTEIN: 7.7 g/dL (ref 6.0–8.3)
Total Bilirubin: 0.3 mg/dL (ref 0.3–1.2)

## 2013-08-28 LAB — POC OCCULT BLOOD, ED: Fecal Occult Bld: NEGATIVE

## 2013-08-28 LAB — URINE MICROSCOPIC-ADD ON

## 2013-08-28 LAB — POC URINE PREG, ED: Preg Test, Ur: NEGATIVE

## 2013-08-28 MED ORDER — CEFTRIAXONE SODIUM 1 G IJ SOLR: 1.0000 g | Freq: Once | INTRAMUSCULAR | Status: DC

## 2013-08-28 MED ORDER — FLEET ENEMA 7-19 GM/118ML RE ENEM
1.0000 | ENEMA | Freq: Once | RECTAL | Status: AC
  Administered 2013-08-28: 1 via RECTAL
  Filled 2013-08-28: qty 1

## 2013-08-28 MED ORDER — PEG 3350-KCL-NA BICARB-NACL 420 G PO SOLR: 4000.0000 mL | Freq: Once | ORAL | Status: DC

## 2013-08-28 MED ORDER — ONDANSETRON 4 MG PO TBDP
4.0000 mg | ORAL_TABLET | Freq: Once | ORAL | Status: AC
  Administered 2013-08-28: 4 mg via ORAL
  Filled 2013-08-28: qty 1

## 2013-08-28 MED ORDER — PEG 3350-KCL-NA BICARB-NACL 420 G PO SOLR
4000.0000 mL | Freq: Once | ORAL | Status: AC
  Administered 2013-08-28: 4000 mL via ORAL
  Filled 2013-08-28: qty 4000

## 2013-08-28 NOTE — ED Provider Notes (Signed)
CSN: [id number]     Arrival date & time 08/27/13  2215 History   First MD Initiated Contact with Patient 08/27/13 2315     Chief Complaint  Patient presents with  . Emesis     (Consider location/radiation/quality/duration/timing/severity/associated sxs/prior Treatment) HPI  This is a 38 year old female with a history of chronic abdominal pain, nausea, and vomiting, hypertension, diabetes, and hyperlipidemia who presents with constipation and vomiting. Patient states she was seen earlier today and her primary doctor attempted to disimpact her. She states that she went home and has not had a bowel movement yet. She is noted blood after she strained to have a bowel movement. Patient also reports urinary retention. She reports being in and out cathed earlier today. And she was told that "it was because I was impacted."  Patient reports lower abdominal pain which is "like I always have."   Past Medical History  Diagnosis Date  . Thrombocytosis     Hem/Onc suggested 2/2 chronic hepatits and/or iron deficiency anemia  . Iron deficiency anemia   . N&V (nausea and vomiting)     Chronic. Unclear etiology with multiple admission and ED visits. CT abdomen with and without contrast (02/2011)  showed no acute process. Gastic Emptying scan (01/2010) was normal. Ultrasound of the abdomen was within normal limits. Hepatitis B viral load was undectable. HIV NR. EGD - gastritis, Hpylori + s/p Rx  . Hypertension   . Hyperlipidemia   . Diabetes mellitus type 2, uncontrolled, with complications   . Recurrent boils   . Back pain   . OSA (obstructive sleep apnea)   . 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 Cath 12/10- no restenosis or significant CAD progression  . Irregular menses     Small ovarian follicles seen on EG(3/15)  . History of pyelonephritis     H/o GrpB Pyelonephritis (9/06) and  UTI- 07/11- E.Coli, 12/10- GBS  . Abscess of tunica vaginalis     10/09- Abundant S. aureus- sensitive to all abx  . Obesity   . Asthma   . Gastritis   . Depression   . Hepatitis B, chronic     Hep BeAb+,Hep B cAb+ & Hep BsAg+ (9/06)  . Peripheral neuropathy   . Fibromyalgia   . GERD (gastroesophageal reflux disease)   . Migraine   . Anxiety   . Gastroparesis     secondary to poorly controlled DM, last emptying study performed 01/2010  was normal but may be falsely positive as pt was on reglan  . Blood dyscrasia   . H/O: CVA (cerebrovascular accident)     history of remote right cerebellar infarct noted on head CT at least since 10/2011  . Cancer   . MI (myocardial infarction)    Past Surgical History  Procedure Laterality Date  . Cesarean section  1997   Family History  Problem Relation Age of Onset  . Diabetes Father    History  Substance Use Topics  . Smoking status: Former Smoker    Types: Cigarettes    Quit date: 04/24/1996  . Smokeless tobacco: Never Used     Comment: quit smoking cigarettes age 8  . Alcohol Use: Yes     Comment: occasionally   OB History   Grav Para Term Preterm Abortions TAB SAB Ect Mult Living  Review of Systems  Constitutional: Negative for fever.  Respiratory: Negative for cough, chest tightness and shortness of breath.   Cardiovascular: Negative for chest pain.  Gastrointestinal: Positive for nausea, vomiting, abdominal pain, constipation, blood in stool and rectal pain.  Genitourinary: Negative for dysuria.       Urinary retention  Musculoskeletal: Negative for back pain.  Neurological: Negative for headaches.  Psychiatric/Behavioral: Negative for confusion.  All other systems reviewed and are negative.     Allergies  Lisinopril and Morphine and related  Home Medications   Prior to Admission medications   Medication Sig Start Date End Date Taking? Authorizing Provider  acetaminophen (TYLENOL) 500 MG  tablet Take 1,000 mg by mouth every 6 (six) hours as needed for moderate pain.   Yes Historical Provider, MD  albuterol (PROVENTIL HFA;VENTOLIN HFA) 108 (90 BASE) MCG/ACT inhaler Inhale 2 puffs into the lungs every 4 (four) hours as needed for wheezing or shortness of breath.   Yes Historical Provider, MD  aspirin EC 81 MG EC tablet Take 1 tablet (81 mg total) by mouth daily. 07/26/13  Yes Rebecca Eaton, MD  atorvastatin (LIPITOR) 80 MG tablet Take 1 tablet (80 mg total) by mouth daily at 6 PM. 07/25/13  Yes Rebecca Eaton, MD  calcium carbonate (TUMS - DOSED IN MG ELEMENTAL CALCIUM) 500 MG chewable tablet Chew 2 tablets by mouth daily.    Yes Historical Provider, MD  ciprofloxacin (CIPRO) 500 MG tablet Take 1 tablet (500 mg total) by mouth 2 (two) times daily. 08/21/13  Yes Lauren Burnetta Sabin, PA-C  gabapentin (NEURONTIN) 300 MG capsule Take 300 mg by mouth 3 (three) times daily as needed (pain).    Yes Historical Provider, MD  insulin aspart (NOVOLOG) 100 UNIT/ML injection Inject 2-12 Units into the skin 3 (three) times daily as needed for high blood sugar. Sliding scale- If sugar 150-199: inject 2 unit;  200-249: 4 units, 250-299: 7 units; 300-349: 10 units; Over 350: 12 units   Yes Historical Provider, MD  insulin glargine (LANTUS) 100 UNIT/ML injection Inject 50 Units into the skin at bedtime. 05/01/13  Yes Marjan Rabbani, MD  LORazepam (ATIVAN) 1 MG tablet Take 1 tablet (1 mg total) by mouth 3 (three) times daily as needed for anxiety. 05/20/13  Yes Garald Balding, NP  omeprazole (PRILOSEC) 40 MG capsule Take 1 capsule (40 mg total) by mouth daily. 03/22/13  Yes Blanchie Dessert, MD  ondansetron (ZOFRAN) 8 MG tablet Take 1 tablet (8 mg total) by mouth every 8 (eight) hours as needed for nausea or vomiting. 08/21/13  Yes Lauren Burnetta Sabin, PA-C  pregabalin (LYRICA) 75 MG capsule Take 1 capsule (75 mg total) by mouth 2 (two) times daily. 11/04/12  Yes Jeralene Huff, MD  labetalol (NORMODYNE) 100 MG  tablet Take 1 tablet (100 mg total) by mouth 2 (two) times daily. 08/27/13   Neema Bobbie Stack, MD   BP 139/100  Pulse 122  Temp(Src) 98.8 F (37.1 C) (Oral)  Resp 20  SpO2 100%  LMP 08/19/2013 Physical Exam  Nursing note and vitals reviewed. Constitutional: She is oriented to person, place, and time. She appears well-developed and well-nourished. No distress.  obese  HENT:  Head: Normocephalic and atraumatic.  Cardiovascular: Normal rate, regular rhythm and normal heart sounds.   No murmur heard. Pulmonary/Chest: Effort normal and breath sounds normal. No respiratory distress. She has no wheezes.  Abdominal: Soft. There is no tenderness. There is no rebound and no guarding.  Hyperactive bowel sounds  Genitourinary: Guaiac negative stool.  Hard stool noted in the rectal vault  Neurological: She is alert and oriented to person, place, and time.  Skin: Skin is warm and dry.  Psychiatric: She has a normal mood and affect.    ED Course  Procedures (including critical care time) Labs Review Labs Reviewed  CBC WITH DIFFERENTIAL - Abnormal; Notable for the following:    WBC 13.3 (*)    Hemoglobin 9.3 (*)    HCT 28.7 (*)    MCV 62.8 (*)    MCH 20.4 (*)    RDW 18.1 (*)    Platelets 568 (*)    Neutro Abs 8.2 (*)    Monocytes Absolute 1.1 (*)    All other components within normal limits  COMPREHENSIVE METABOLIC PANEL - Abnormal; Notable for the following:    Glucose, Bld 312 (*)    Albumin 3.2 (*)    GFR calc non Af Amer 74 (*)    GFR calc Af Amer 85 (*)    All other components within normal limits  URINALYSIS, ROUTINE W REFLEX MICROSCOPIC - Abnormal; Notable for the following:    APPearance CLOUDY (*)    Specific Gravity, Urine 1.039 (*)    Glucose, UA >1000 (*)    Hgb urine dipstick LARGE (*)    Leukocytes, UA SMALL (*)    All other components within normal limits  URINE MICROSCOPIC-ADD ON  POC URINE PREG, ED  POC OCCULT BLOOD, ED    Imaging Review Dg Abd 1  View  08/27/2013   CLINICAL DATA:  Abdominal pain  EXAM: ABDOMEN - 1 VIEW  COMPARISON:  DG ABD ACUTE W/CHEST dated 07/30/2013  FINDINGS: There is a moderate amount stool throughout the colon. There is no bowel dilatation to suggest obstruction. There is no evidence of pneumoperitoneum, portal venous gas or pneumatosis. There are no pathologic calcifications along the expected course of the ureters.The osseous structures are unremarkable.  IMPRESSION: Moderate of stool throughout the colon.   Electronically Signed   By: Kathreen Devoid   On: 08/27/2013 23:52     EKG Interpretation None      MDM   Final diagnoses:  Constipation  Abdominal pain  Vomiting    Patient with history of chronic abdominal pain and vomiting. Now reports constipation and urinary retention. He is nontoxic on exam. Abdomen is benign and without signs of peritonitis. KUB shows moderate stool burden. Patient was disimpacted manually and then was given an enema. She had a good bowel movement. She continues to report abdominal pain; however, I discussed with patient that narcotic pain medications would likely worsen her symptoms given the current constipation. She will be discharged home on GoLYTELY. She is able to tolerate liquids by mouth.  After history, exam, and medical workup I feel the patient has been appropriately medically screened and is safe for discharge home. Pertinent diagnoses were discussed with the patient. Patient was given return precautions.     Merryl Hacker, MD 08/28/13 [phone]

## 2013-08-28 NOTE — Discharge Instructions (Signed)
Constipation, Adult Constipation is when a person has fewer than 3 bowel movements a week; has difficulty having a bowel movement; or has stools that are dry, hard, or larger than normal. As people grow older, constipation is more common. If you try to fix constipation with medicines that make you have a bowel movement (laxatives), the problem may get worse. Long-term laxative use may cause the muscles of the colon to become weak. A low-fiber diet, not taking in enough fluids, and taking certain medicines may make constipation worse. CAUSES   Certain medicines, such as antidepressants, pain medicine, iron supplements, antacids, and water pills.   Certain diseases, such as diabetes, irritable bowel syndrome (IBS), thyroid disease, or depression.   Not drinking enough water.   Not eating enough fiber-rich foods.   Stress or travel.  Lack of physical activity or exercise.  Not going to the restroom when there is the urge to have a bowel movement.  Ignoring the urge to have a bowel movement.  Using laxatives too much. SYMPTOMS   Having fewer than 3 bowel movements a week.   Straining to have a bowel movement.   Having hard, dry, or larger than normal stools.   Feeling full or bloated.   Pain in the lower abdomen.  Not feeling relief after having a bowel movement. DIAGNOSIS  Your caregiver will take a medical history and perform a physical exam. Further testing may be done for severe constipation. Some tests may include:   A barium enema X-ray to examine your rectum, colon, and sometimes, your small intestine.  A sigmoidoscopy to examine your lower colon.  A colonoscopy to examine your entire colon. TREATMENT  Treatment will depend on the severity of your constipation and what is causing it. Some dietary treatments include drinking more fluids and eating more fiber-rich foods. Lifestyle treatments may include regular exercise. If these diet and lifestyle recommendations  do not help, your caregiver may recommend taking over-the-counter laxative medicines to help you have bowel movements. Prescription medicines may be prescribed if over-the-counter medicines do not work.  HOME CARE INSTRUCTIONS   Increase dietary fiber in your diet, such as fruits, vegetables, whole grains, and beans. Limit high-fat and processed sugars in your diet, such as French fries, hamburgers, cookies, candies, and soda.   A fiber supplement may be added to your diet if you cannot get enough fiber from foods.   Drink enough fluids to keep your urine clear or pale yellow.   Exercise regularly or as directed by your caregiver.   Go to the restroom when you have the urge to go. Do not hold it.  Only take medicines as directed by your caregiver. Do not take other medicines for constipation without talking to your caregiver first. SEEK IMMEDIATE MEDICAL CARE IF:   You have bright red blood in your stool.   Your constipation lasts for more than 4 days or gets worse.   You have abdominal or rectal pain.   You have thin, pencil-like stools.  You have unexplained weight loss. MAKE SURE YOU:   Understand these instructions.  Will watch your condition.  Will get help right away if you are not doing well or get worse. Document Released: 01/07/2004 Document Revised: 07/03/2011 Document Reviewed: 01/20/2013 [employer] Patient Information 2014 [employer].  

## 2013-08-28 NOTE — Progress Notes (Signed)
Case discussed with Dr. Sharda at the time of the visit.  We reviewed the resident's history and exam and pertinent patient test results.  I agree with the assessment, diagnosis, and plan of care documented in the resident's note.    

## 2013-08-31 IMAGING — CR DG ABDOMEN ACUTE W/ 1V CHEST
4 series · 4 of 4 positions shown · non-contrast
Comparison: Abdominal radiographs dated 11/22/2010

CLINICAL DATA: Abdominal pain, vomiting

ACUTE ABDOMEN SERIES (ABDOMEN 2 VIEW & CHEST 1 VIEW)

[w chest pa]
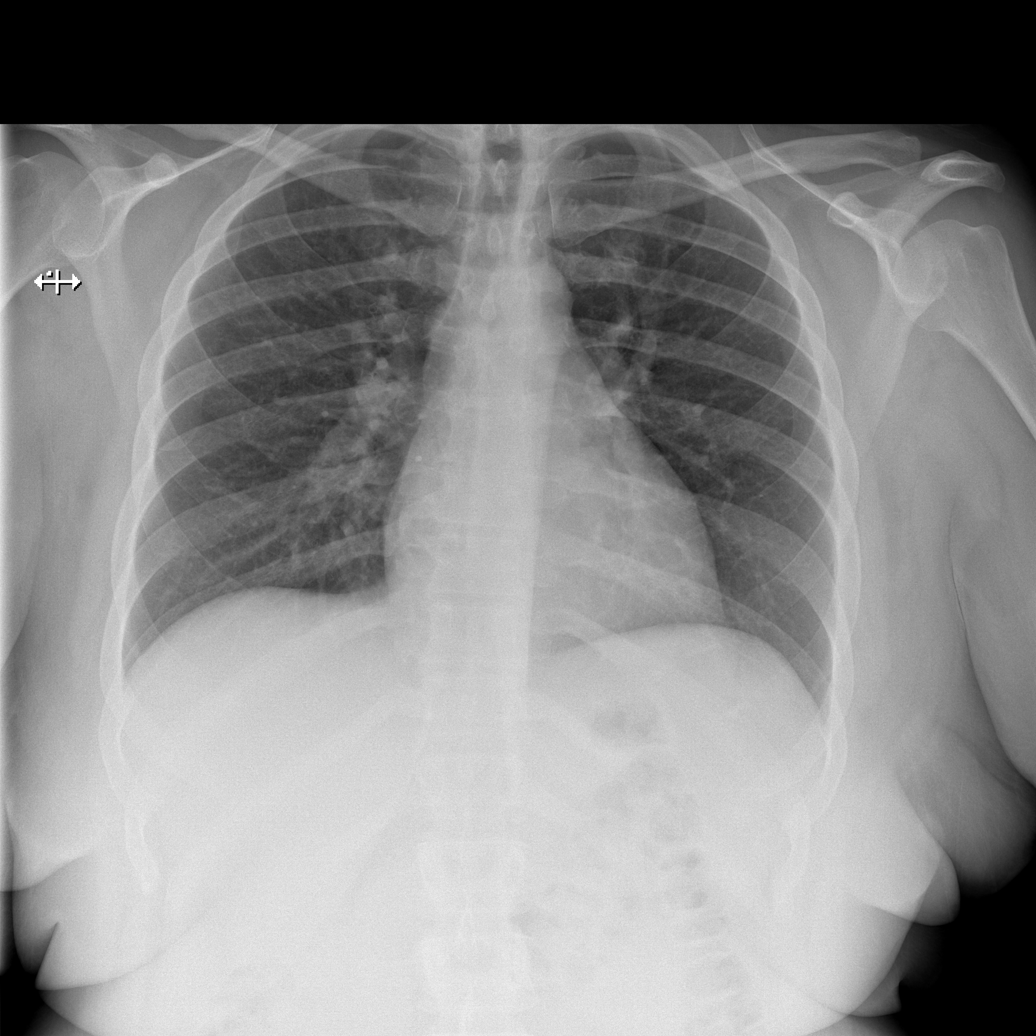

[w abdomen upright]
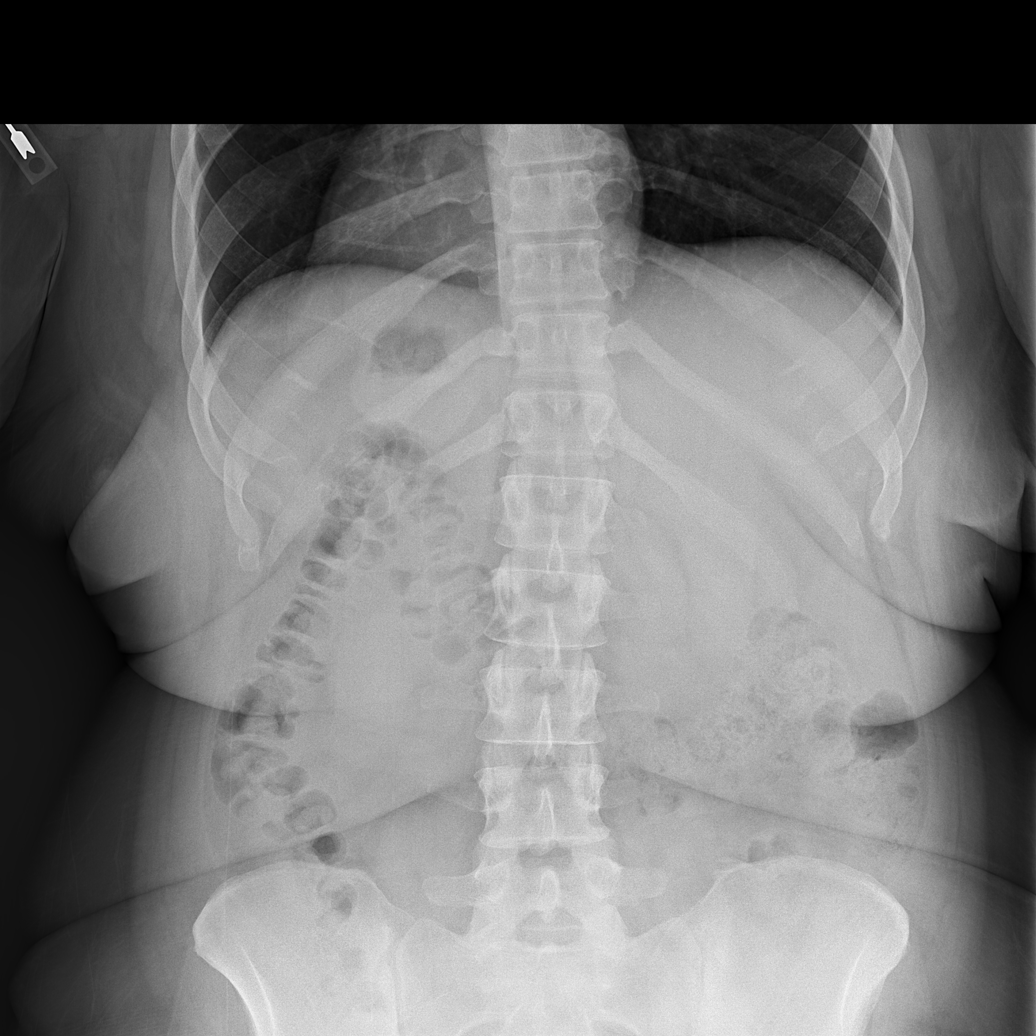

[t abdomen supine (1 of 2)]
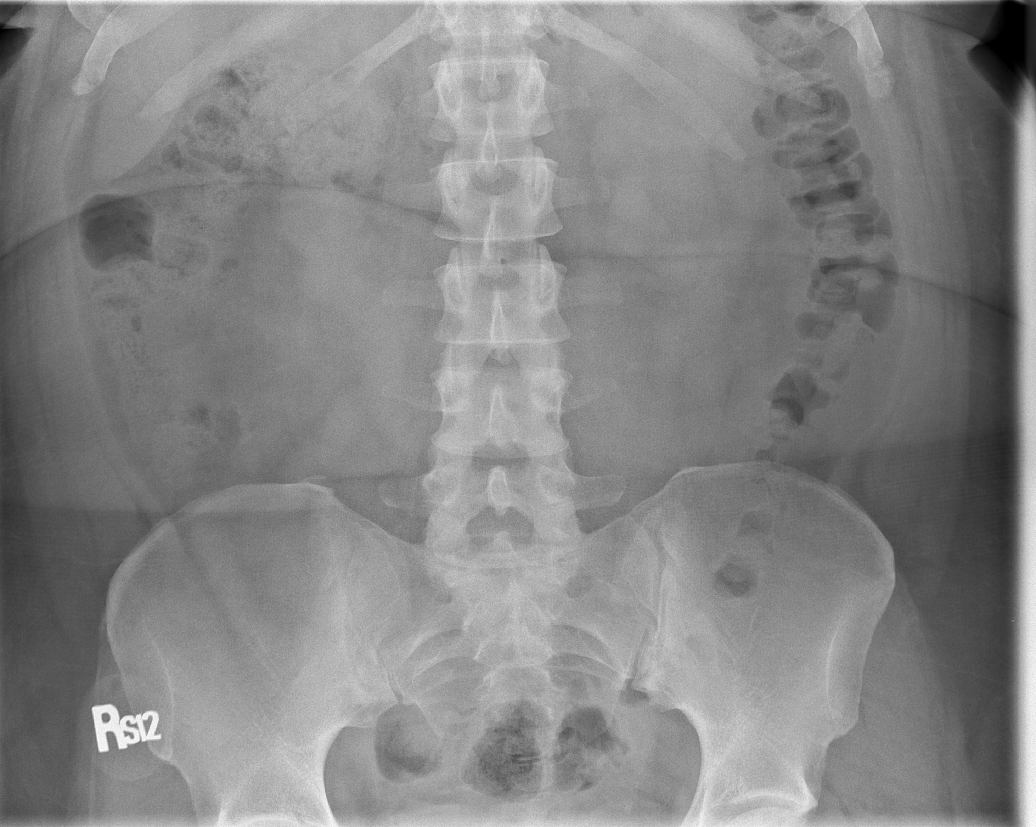

[t abdomen supine (2 of 2)]
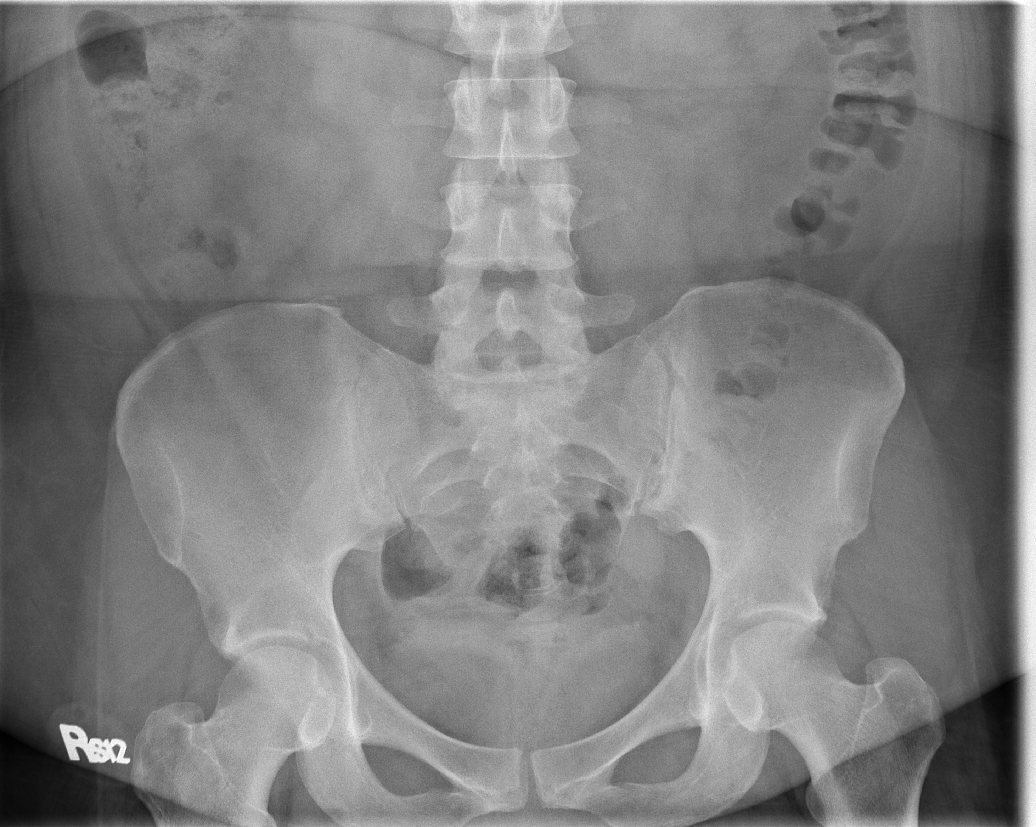

[4 of 4 positions shown; findings below may reference images not displayed]

FINDINGS: Lungs are clear. No pleural effusion or pneumothorax.

Cardiomediastinal silhouette is within normal limits.

Nonobstructive bowel gas pattern.

No evidence of free air under the diaphragm on the upright view.

Visualized osseous structures are within normal limits.
IMPRESSION: No evidence of acute cardiopulmonary disease.

No evidence of small bowel obstruction or free air.

## 2013-09-29 ENCOUNTER — Encounter (HOSPITAL_COMMUNITY): Payer: Self-pay | Admitting: Emergency Medicine

## 2013-09-29 ENCOUNTER — Emergency Department (HOSPITAL_COMMUNITY): Payer: Medicaid Other

## 2013-09-29 ENCOUNTER — Emergency Department (HOSPITAL_COMMUNITY)
Admission: EM | Admit: 2013-09-29 | Discharge: 2013-09-30 | Disposition: A | Discharge status: Home / Self Care | Payer: Medicaid Other | Attending: Emergency Medicine | Admitting: Emergency Medicine

## 2013-09-29 DIAGNOSIS — Z862 Personal history of diseases of the blood and blood-forming organs and certain disorders involving the immune mechanism: Secondary | ICD-10-CM | POA: Insufficient documentation

## 2013-09-29 DIAGNOSIS — Z8619 Personal history of other infectious and parasitic diseases: Secondary | ICD-10-CM | POA: Insufficient documentation

## 2013-09-29 DIAGNOSIS — Z8669 Personal history of other diseases of the nervous system and sense organs: Secondary | ICD-10-CM | POA: Insufficient documentation

## 2013-09-29 DIAGNOSIS — G43909 Migraine, unspecified, not intractable, without status migrainosus: Secondary | ICD-10-CM | POA: Insufficient documentation

## 2013-09-29 DIAGNOSIS — Z79899 Other long term (current) drug therapy: Secondary | ICD-10-CM | POA: Insufficient documentation

## 2013-09-29 DIAGNOSIS — Z8673 Personal history of transient ischemic attack (TIA), and cerebral infarction without residual deficits: Secondary | ICD-10-CM | POA: Insufficient documentation

## 2013-09-29 DIAGNOSIS — Z7982 Long term (current) use of aspirin: Secondary | ICD-10-CM | POA: Insufficient documentation

## 2013-09-29 DIAGNOSIS — E118 Type 2 diabetes mellitus with unspecified complications: Secondary | ICD-10-CM

## 2013-09-29 DIAGNOSIS — R0789 Other chest pain: Secondary | ICD-10-CM | POA: Insufficient documentation

## 2013-09-29 DIAGNOSIS — R000 Tachycardia, unspecified: Secondary | ICD-10-CM | POA: Insufficient documentation

## 2013-09-29 DIAGNOSIS — E785 Hyperlipidemia, unspecified: Secondary | ICD-10-CM | POA: Insufficient documentation

## 2013-09-29 DIAGNOSIS — I10 Essential (primary) hypertension: Secondary | ICD-10-CM | POA: Insufficient documentation

## 2013-09-29 DIAGNOSIS — J45909 Unspecified asthma, uncomplicated: Secondary | ICD-10-CM | POA: Insufficient documentation

## 2013-09-29 DIAGNOSIS — I2510 Atherosclerotic heart disease of native coronary artery without angina pectoris: Secondary | ICD-10-CM | POA: Insufficient documentation

## 2013-09-29 DIAGNOSIS — G609 Hereditary and idiopathic neuropathy, unspecified: Secondary | ICD-10-CM | POA: Insufficient documentation

## 2013-09-29 DIAGNOSIS — Z794 Long term (current) use of insulin: Secondary | ICD-10-CM | POA: Insufficient documentation

## 2013-09-29 DIAGNOSIS — Z87891 Personal history of nicotine dependence: Secondary | ICD-10-CM | POA: Insufficient documentation

## 2013-09-29 DIAGNOSIS — R112 Nausea with vomiting, unspecified: Secondary | ICD-10-CM

## 2013-09-29 DIAGNOSIS — IMO0002 Reserved for concepts with insufficient information to code with codable children: Secondary | ICD-10-CM | POA: Insufficient documentation

## 2013-09-29 DIAGNOSIS — Z8719 Personal history of other diseases of the digestive system: Secondary | ICD-10-CM | POA: Insufficient documentation

## 2013-09-29 DIAGNOSIS — F411 Generalized anxiety disorder: Secondary | ICD-10-CM | POA: Insufficient documentation

## 2013-09-29 DIAGNOSIS — I252 Old myocardial infarction: Secondary | ICD-10-CM | POA: Insufficient documentation

## 2013-09-29 DIAGNOSIS — Z859 Personal history of malignant neoplasm, unspecified: Secondary | ICD-10-CM | POA: Insufficient documentation

## 2013-09-29 DIAGNOSIS — E1165 Type 2 diabetes mellitus with hyperglycemia: Secondary | ICD-10-CM | POA: Insufficient documentation

## 2013-09-29 DIAGNOSIS — Z87440 Personal history of urinary (tract) infections: Secondary | ICD-10-CM | POA: Insufficient documentation

## 2013-09-29 DIAGNOSIS — E669 Obesity, unspecified: Secondary | ICD-10-CM | POA: Insufficient documentation

## 2013-09-29 DIAGNOSIS — Z872 Personal history of diseases of the skin and subcutaneous tissue: Secondary | ICD-10-CM | POA: Insufficient documentation

## 2013-09-29 DIAGNOSIS — Z8742 Personal history of other diseases of the female genital tract: Secondary | ICD-10-CM | POA: Insufficient documentation

## 2013-09-29 DIAGNOSIS — R109 Unspecified abdominal pain: Secondary | ICD-10-CM | POA: Insufficient documentation

## 2013-09-29 DIAGNOSIS — Z8659 Personal history of other mental and behavioral disorders: Secondary | ICD-10-CM | POA: Insufficient documentation

## 2013-09-29 LAB — CBC WITH DIFFERENTIAL/PLATELET
Basophils Absolute: 0 10*3/uL (ref 0.0–0.1)
Basophils Relative: 0 % (ref 0–1)
Eosinophils Absolute: 0.2 10*3/uL (ref 0.0–0.7)
Eosinophils Relative: 1 % (ref 0–5)
HCT: 33.5 % — ABNORMAL LOW (ref 36.0–46.0)
Hemoglobin: 10.9 g/dL — ABNORMAL LOW (ref 12.0–15.0)
Lymphocytes Relative: 25 % (ref 12–46)
Lymphs Abs: 4 10*3/uL (ref 0.7–4.0)
MCH: 20.9 pg — ABNORMAL LOW (ref 26.0–34.0)
MCHC: 32.5 g/dL (ref 30.0–36.0)
MCV: 64.2 fL — ABNORMAL LOW (ref 78.0–100.0)
Monocytes Absolute: 1.3 10*3/uL — ABNORMAL HIGH (ref 0.1–1.0)
Monocytes Relative: 8 % (ref 3–12)
Neutro Abs: 10.3 10*3/uL — ABNORMAL HIGH (ref 1.7–7.7)
Neutrophils Relative %: 66 % (ref 43–77)
Platelets: 633 10*3/uL — ABNORMAL HIGH (ref 150–400)
RBC: 5.22 MIL/uL — ABNORMAL HIGH (ref 3.87–5.11)
RDW: 18.3 % — ABNORMAL HIGH (ref 11.5–15.5)
WBC: 15.8 10*3/uL — ABNORMAL HIGH (ref 4.0–10.5)

## 2013-09-29 LAB — BLOOD GAS, ARTERIAL
Acid-base deficit: 0 mmol/L (ref 0.0–2.0)
Bicarbonate: 22.7 mEq/L (ref 20.0–24.0)
Drawn by: 11249
FIO2: 0.21 %
O2 Saturation: 96.8 %
Patient temperature: 37
TCO2: 20.8 mmol/L (ref 0–100)
pCO2 arterial: 31.6 mmHg — ABNORMAL LOW (ref 35.0–45.0)
pH, Arterial: 7.47 — ABNORMAL HIGH (ref 7.350–7.450)
pO2, Arterial: 86.3 mmHg (ref 80.0–100.0)

## 2013-09-29 LAB — BASIC METABOLIC PANEL
BUN: 16 mg/dL (ref 6–23)
CO2: 20 mEq/L (ref 19–32)
Calcium: 9.8 mg/dL (ref 8.4–10.5)
Chloride: 100 mEq/L (ref 96–112)
Creatinine, Ser: 0.93 mg/dL (ref 0.50–1.10)
GFR calc Af Amer: 89 mL/min — ABNORMAL LOW (ref >90)
GFR calc non Af Amer: 76 mL/min — ABNORMAL LOW (ref >90)
Glucose, Bld: 141 mg/dL — ABNORMAL HIGH (ref 70–99)
Potassium: 3.8 mEq/L (ref 3.7–5.3)
Sodium: 137 mEq/L (ref 137–147)

## 2013-09-29 LAB — TROPONIN I: Troponin I: <0.3 ng/mL (ref <0.30)

## 2013-09-29 MED ORDER — PROMETHAZINE HCL 25 MG/ML IJ SOLN
25.0000 mg | Freq: Once | INTRAMUSCULAR | Status: AC
  Administered 2013-09-30: 25 mg via INTRAVENOUS
  Filled 2013-09-29: qty 1

## 2013-09-29 MED ORDER — HYDROMORPHONE HCL PF 1 MG/ML IJ SOLN
1.0000 mg | Freq: Once | INTRAMUSCULAR | Status: AC
  Administered 2013-09-29: 1 mg via INTRAVENOUS
  Filled 2013-09-29: qty 1

## 2013-09-29 MED ORDER — HYDROMORPHONE HCL PF 1 MG/ML IJ SOLN
1.0000 mg | Freq: Once | INTRAMUSCULAR | Status: AC
  Administered 2013-09-30: 1 mg via INTRAVENOUS
  Filled 2013-09-29: qty 1

## 2013-09-29 MED ORDER — SODIUM CHLORIDE 0.9 % IV BOLUS (SEPSIS)
1000.0000 mL | Freq: Once | INTRAVENOUS | Status: AC
  Administered 2013-09-29: 1000 mL via INTRAVENOUS

## 2013-09-29 MED ORDER — ONDANSETRON HCL 4 MG/2ML IJ SOLN
4.0000 mg | Freq: Once | INTRAMUSCULAR | Status: AC
  Administered 2013-09-29: 4 mg via INTRAVENOUS
  Filled 2013-09-29: qty 2

## 2013-09-29 NOTE — ED Notes (Signed)
Amy, RT called and made aware of order for ABG

## 2013-09-29 NOTE — ED Notes (Signed)
Patient transported to CXR via stretcher in NAD

## 2013-09-29 NOTE — ED Notes (Signed)
Patient is alert and oriented x3.  She is complaining of chest pain with abdominal pain  That started 2 days ago.  She adds that she also has been nausea and vomiting.   Patient has a history of chest pain .  Currently she rates her pain 10 of 10.

## 2013-09-29 NOTE — ED Notes (Signed)
Patient refusing for this nurse to start PIV and obtain labs Patient stated, "Normally the guy that's up in triage is the one that does it." Patient is referring to Topher, RN Patient states that she does not want this nurse or any other nursing staff to attempt to look  Will make ED Charge aware and Topher, RN aware

## 2013-09-29 NOTE — ED Notes (Signed)
Topher, RN at bedside to attempt PIV placement

## 2013-09-29 NOTE — Discharge Instructions (Signed)
Abdominal Pain, Adult Many things can cause abdominal pain. Usually, abdominal pain is not caused by a disease and will improve without treatment. It can often be observed and treated at home. Your health care provider will do a physical exam and possibly order blood tests and X-rays to help determine the seriousness of your pain. However, in many cases, more time must pass before a clear cause of the pain can be found. Before that point, your health care provider may not know if you need more testing or further treatment. HOME CARE INSTRUCTIONS  Monitor your abdominal pain for any changes. The following actions may help to alleviate any discomfort you are experiencing:  Only take over-the-counter or prescription medicines as directed by your health care provider.  Do not take laxatives unless directed to do so by your health care provider.  Try a clear liquid diet (broth, tea, or water) as directed by your health care provider. Slowly move to a bland diet as tolerated. SEEK MEDICAL CARE IF:  You have unexplained abdominal pain.  You have abdominal pain associated with nausea or diarrhea.  You have pain when you urinate or have a bowel movement.  You experience abdominal pain that wakes you in the night.  You have abdominal pain that is worsened or improved by eating food.  You have abdominal pain that is worsened with eating fatty foods. SEEK IMMEDIATE MEDICAL CARE IF:   Your pain does not go away within 2 hours.  You have a fever.  You keep throwing up (vomiting).  Your pain is felt only in portions of the abdomen, such as the right side or the left lower portion of the abdomen.  You pass bloody or black tarry stools. MAKE SURE YOU:  Understand these instructions.   Will watch your condition.   Will get help right away if you are not doing well or get worse.  Document Released: 01/18/2005 Document Revised: 01/29/2013 Document Reviewed: 12/18/2012 [facility] Patient  Information 2014 [place].  Nausea and Vomiting Nausea means you feel sick to your stomach. Throwing up (vomiting) is a reflex where stomach contents come out of your mouth. HOME CARE   Take medicine as told by your doctor.  Do not force yourself to eat. However, you do need to drink fluids.  If you feel like eating, eat a normal diet as told by your doctor.  Eat rice, wheat, potatoes, bread, lean meats, yogurt, fruits, and vegetables.  Avoid high-fat foods.  Drink enough fluids to keep your pee (urine) clear or pale yellow.  Ask your doctor how to replace body fluid losses (rehydrate). Signs of body fluid loss (dehydration) include:  Feeling very thirsty.  Dry lips and mouth.  Feeling dizzy.  Dark pee.  Peeing less than normal.  Feeling confused.  Fast breathing or heart rate. GET HELP RIGHT AWAY IF:   You have blood in your throw up.  You have black or bloody poop (stool).  You have a bad headache or stiff neck.  You feel confused.  You have bad belly (abdominal) pain.  You have chest pain or trouble breathing.  You do not pee at least once every 8 hours.  You have cold, clammy skin.  You keep throwing up after 24 to 48 hours.  You have a fever. MAKE SURE YOU:   Understand these instructions.  Will watch your condition.  Will get help right away if you are not doing well or get worse. Document Released: 09/27/2007 Document Revised: 07/03/2011  Document Reviewed: 09/09/2010 [facility] Patient Information 2014 [place].

## 2013-09-29 NOTE — ED Notes (Signed)
Patient back from CXR IVF started and patient medicated for c/o nausea, see MAR

## 2013-09-30 LAB — URINALYSIS, ROUTINE W REFLEX MICROSCOPIC
Glucose, UA: 250 mg/dL — AB
Hgb urine dipstick: NEGATIVE
Ketones, ur: 40 mg/dL — AB
Leukocytes, UA: NEGATIVE
Nitrite: NEGATIVE
Protein, ur: 30 mg/dL — AB
Specific Gravity, Urine: 1.031 — ABNORMAL HIGH (ref 1.005–1.030)
Urobilinogen, UA: 1 mg/dL (ref 0.0–1.0)
pH: 5.5 (ref 5.0–8.0)

## 2013-09-30 LAB — URINE MICROSCOPIC-ADD ON

## 2013-09-30 NOTE — ED Notes (Addendum)
Patient up to bathroom--aware of need for urine specimen Will medicate patient when she returns to room

## 2013-10-01 ENCOUNTER — Other Ambulatory Visit: Payer: Self-pay | Admitting: Internal Medicine

## 2013-10-06 IMAGING — CR DG ABDOMEN ACUTE W/ 1V CHEST
5 series · 5 of 5 positions shown · non-contrast
Comparison: Chest and abdominal radiographs performed 01/26/2011

CLINICAL DATA: Cocaine withdrawal; diffuse chest and abdominal
pain.

ACUTE ABDOMEN SERIES (ABDOMEN 2 VIEW & CHEST 1 VIEW)

[w chest pa]
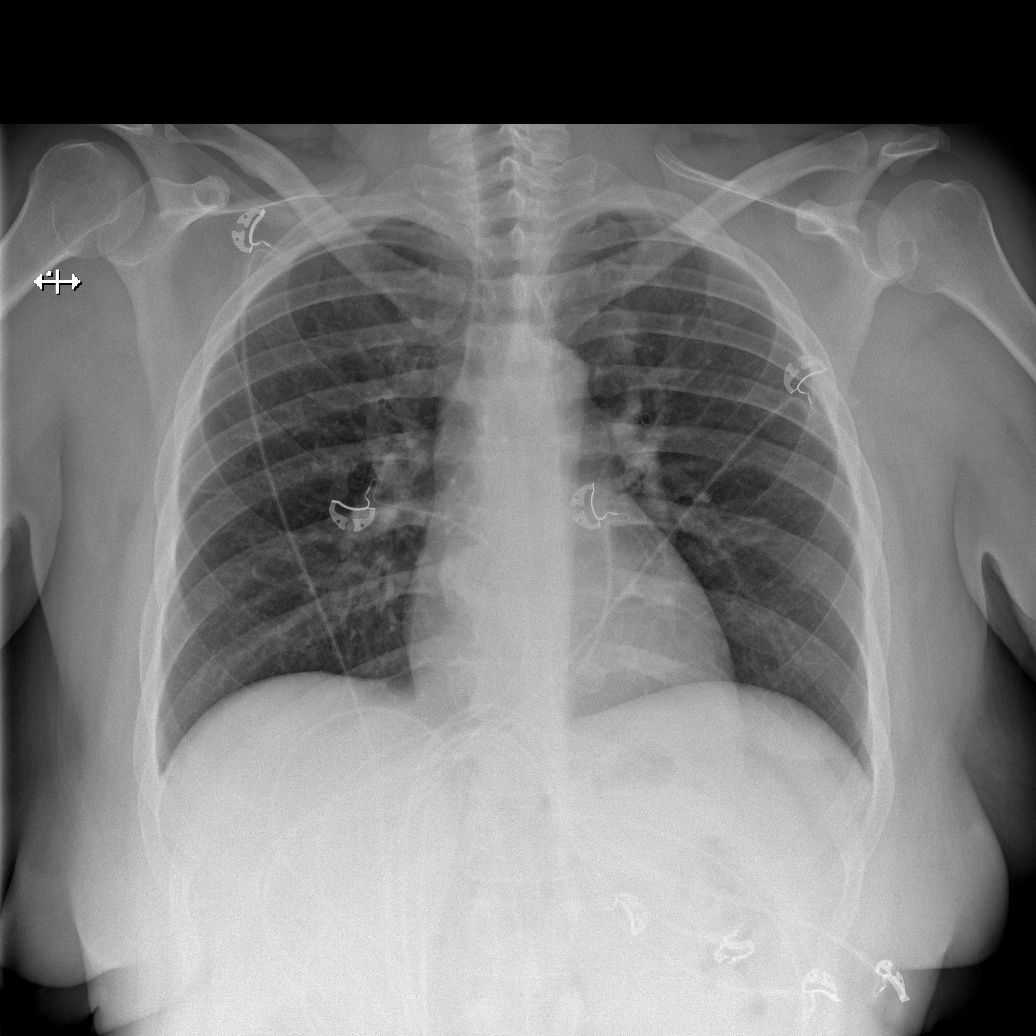

[w abdomen upright (1 of 2)]
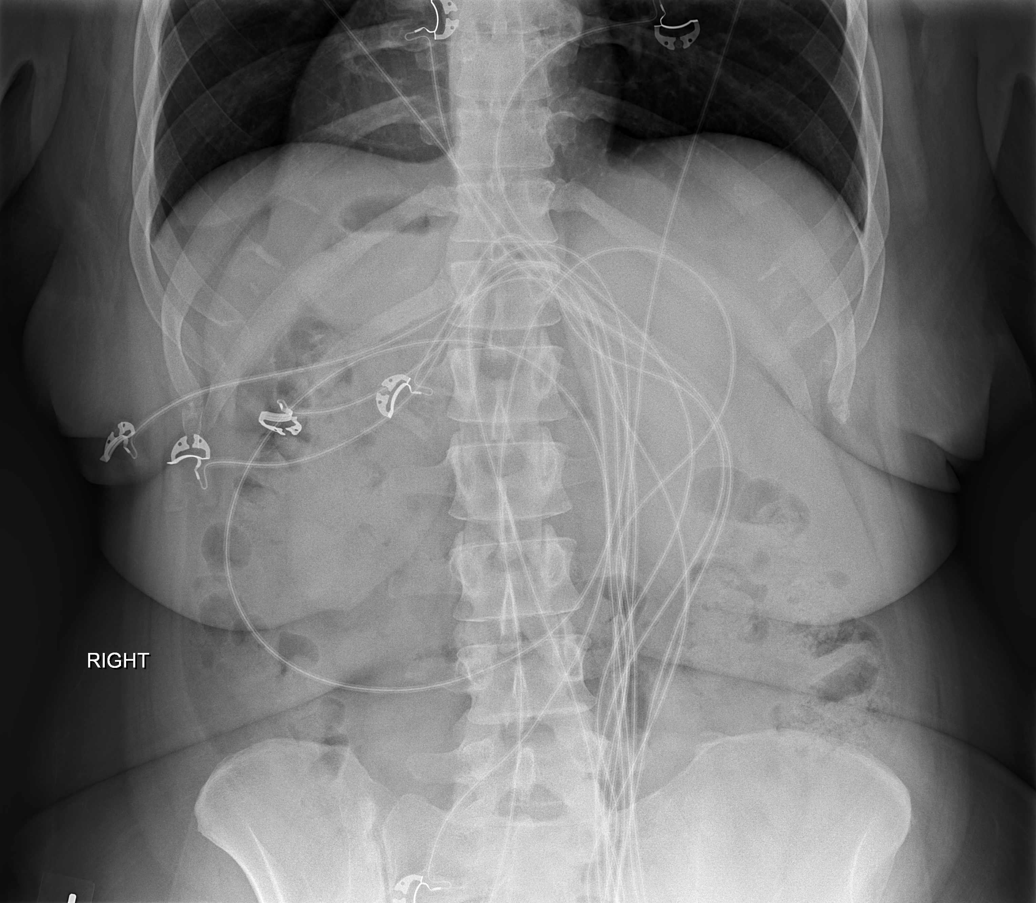

[w abdomen upright (2 of 2)]
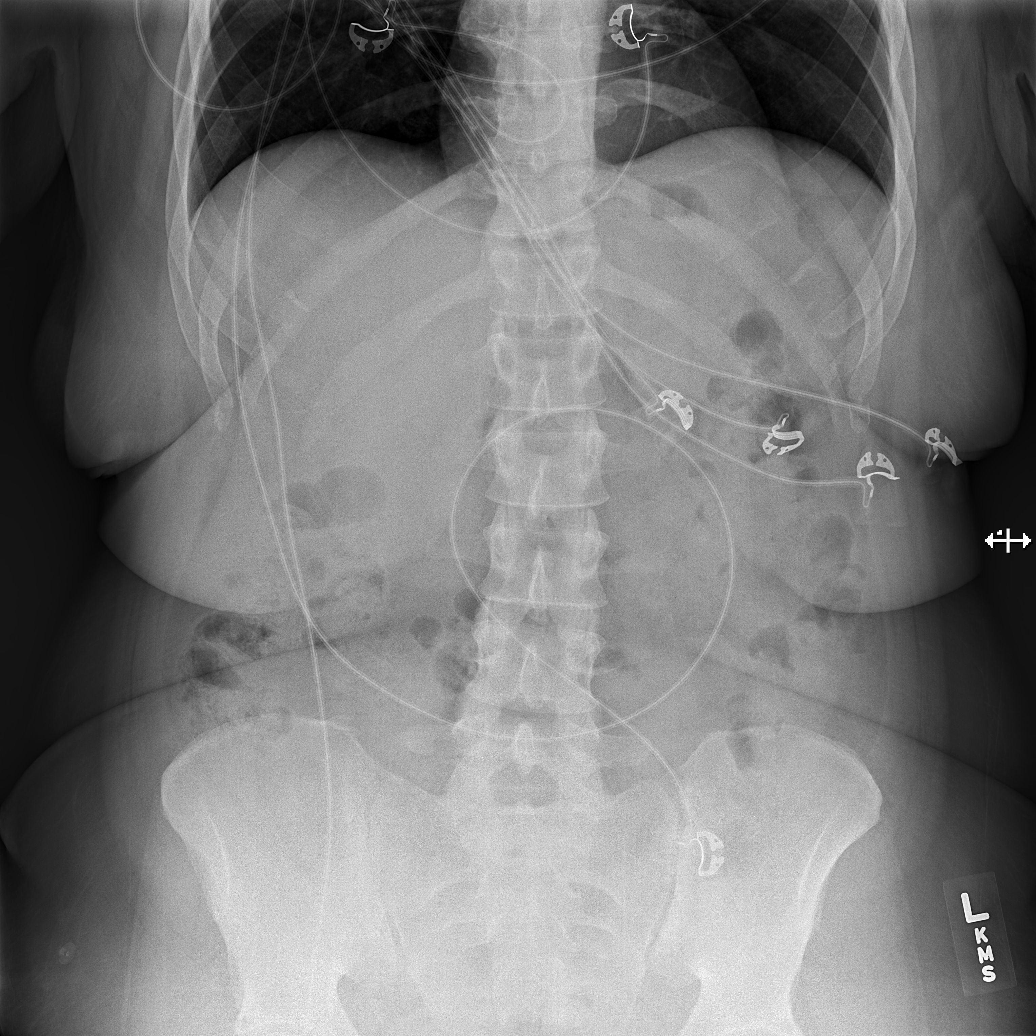

[t abdomen supine (1 of 2)]
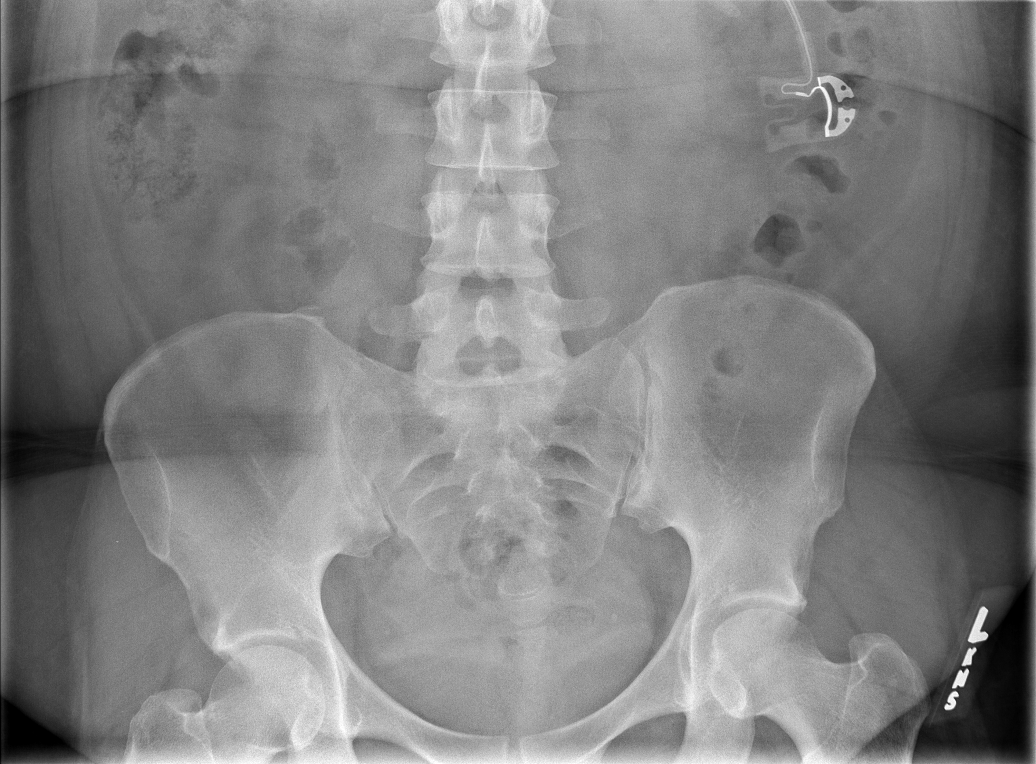

[t abdomen supine (2 of 2)]
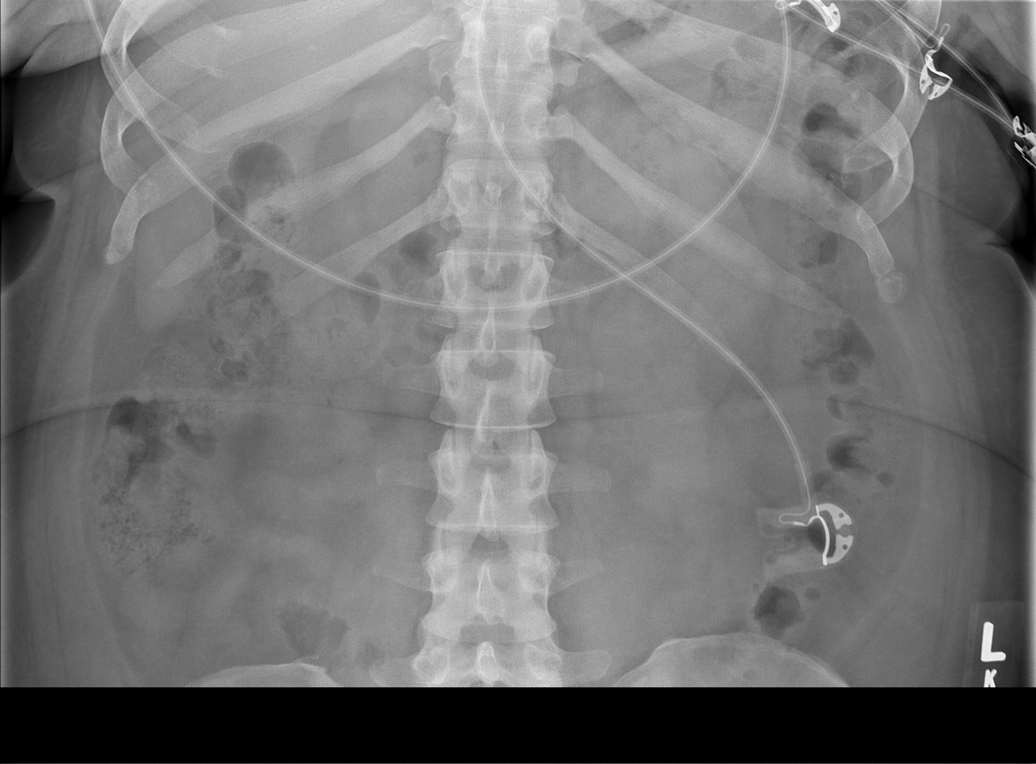

[5 of 5 positions shown; findings below may reference images not displayed]

FINDINGS: The lungs are well-aerated and clear.  There is no
evidence of focal opacification, pleural effusion or pneumothorax.
The cardiomediastinal silhouette is within normal limits.

The visualized bowel gas pattern is unremarkable.  Scattered stool
and air are noted within the colon; there is no evidence of small
bowel dilatation to suggest obstruction.  No free intra-abdominal
air is identified on the provided upright view.

No acute osseous abnormalities are seen; the sacroiliac joints are
unremarkable in appearance.
IMPRESSION: 1.  Unremarkable bowel gas pattern; no free intra-abdominal air
seen.
2.  No acute cardiopulmonary process identified.

## 2013-10-07 IMAGING — US US ART/VEN ABD/PELV/SCROTUM DOPPLER LTD
1 series · 13 of 25 positions shown · non-contrast
Comparison: CT abdomen/pelvis dated 08/28/2010.

CLINICAL DATA: Left pelvic pain, evaluate for ovarian cyst /
torsion

TRANSABDOMINAL AND TRANSVAGINAL ULTRASOUND OF PELVIS
DOPPLER ULTRASOUND OF OVARIES
TECHNIQUE: Both transabdominal and transvaginal ultrasound
examinations of the pelvis were performed. Transabdominal technique
was performed for global imaging of the pelvis including uterus,
ovaries, adnexal regions, and pelvic cul-de-sac.
It was necessary to proceed with endovaginal exam following the
transabdominal exam to visualize the endometrium.
Color and duplex Doppler ultrasound was utilized to evaluate blood
flow to the ovaries.

[Series 1: us art/ven abd/pelv/scrotum doppler ltd · 0.26mm/px · 13 of 55 slices shown]
[im 1/55]
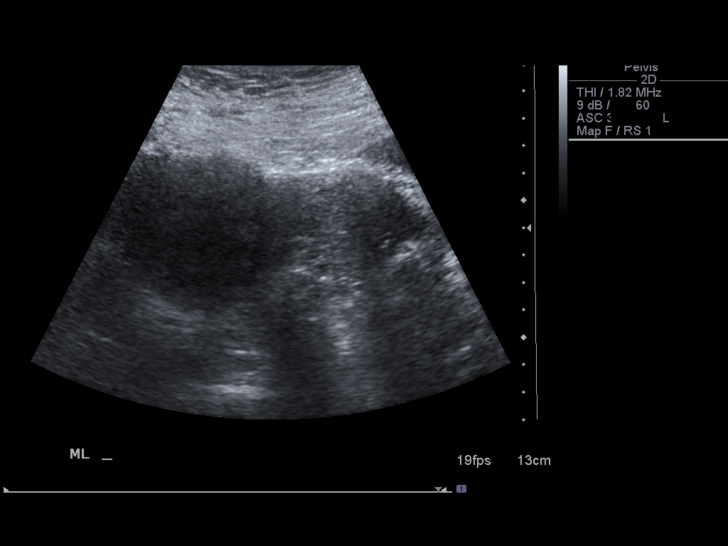
[im 5/55]
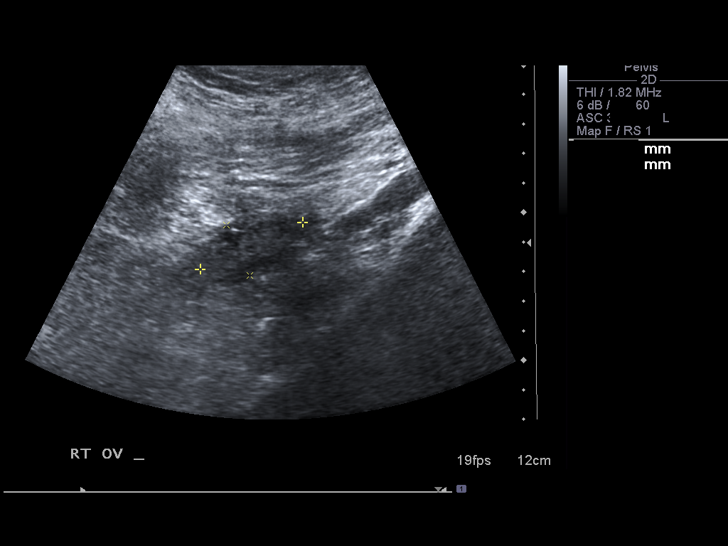
[im 10/55]
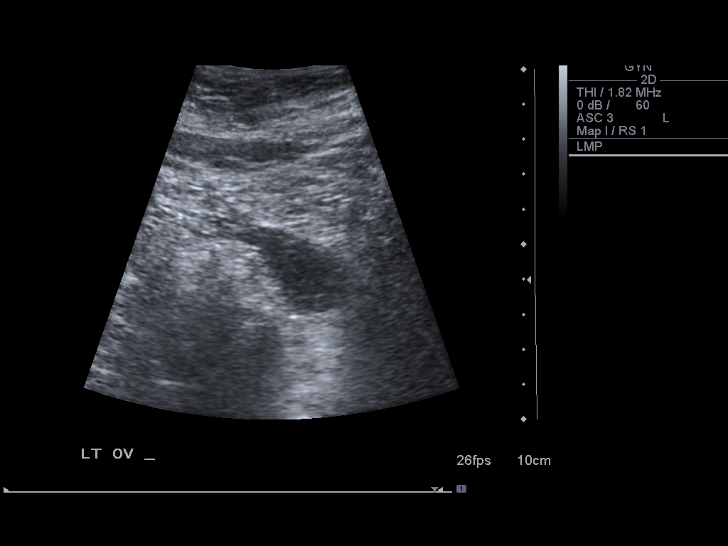
[im 14/55]
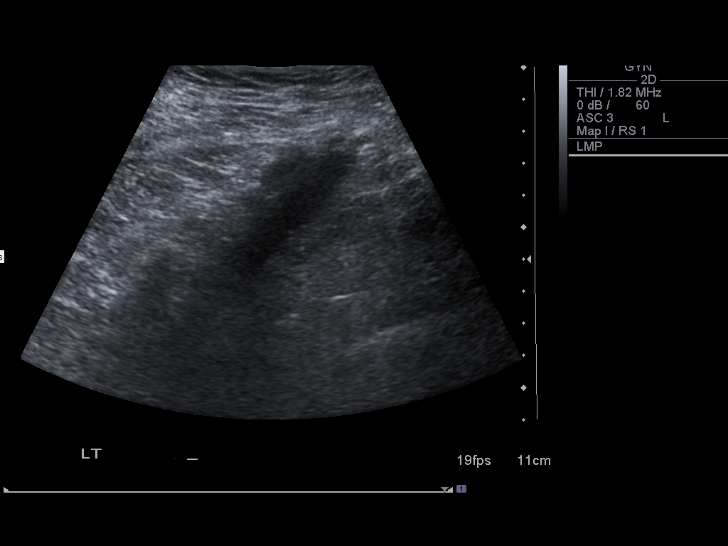
[im 19/55]
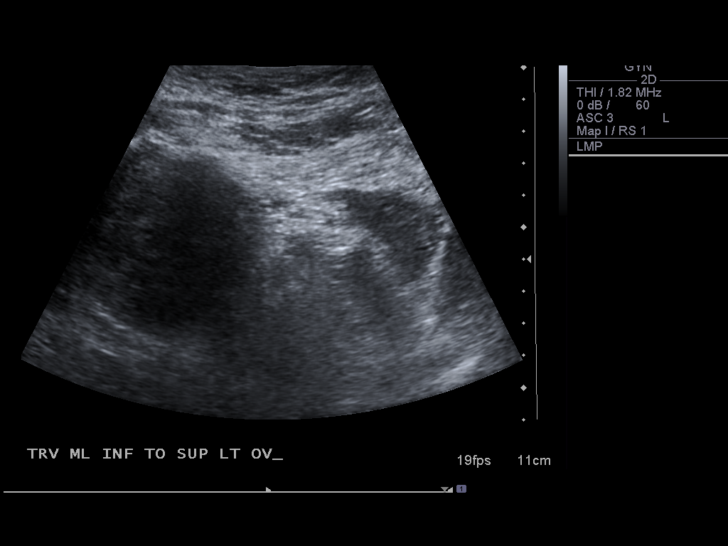
[im 23/55]
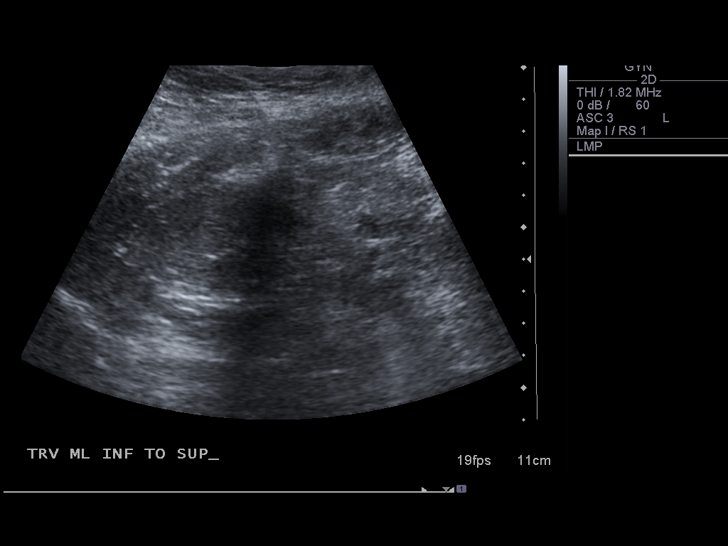
[im 28/55]
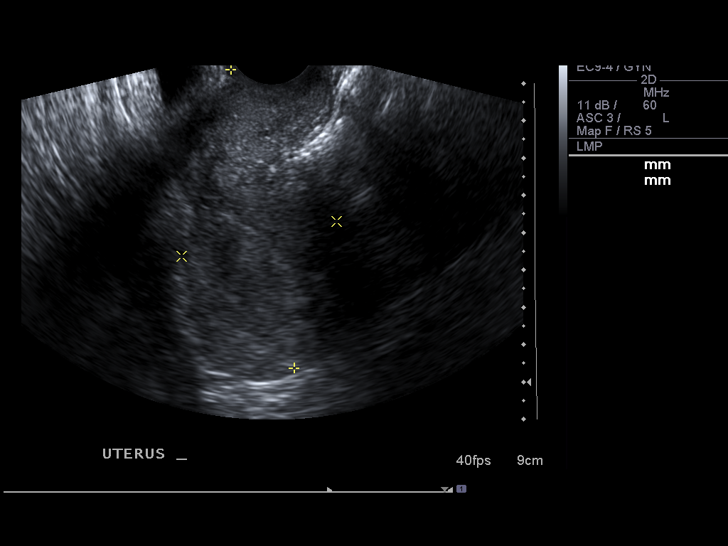
[im 32/55]
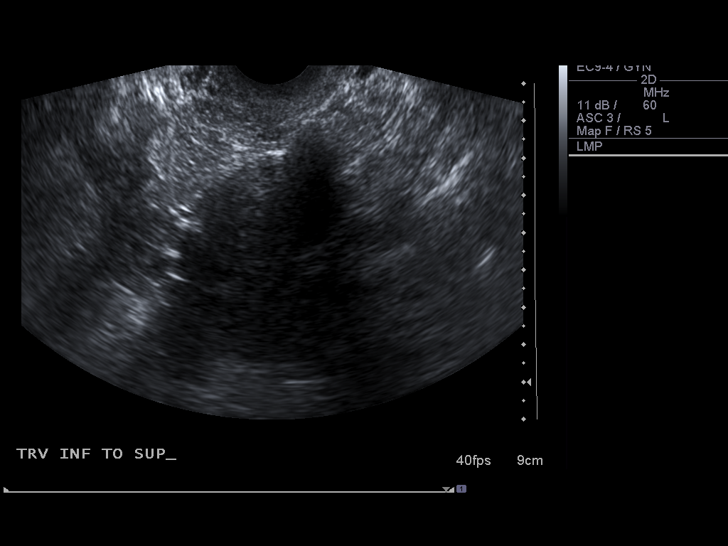
[im 37/55]
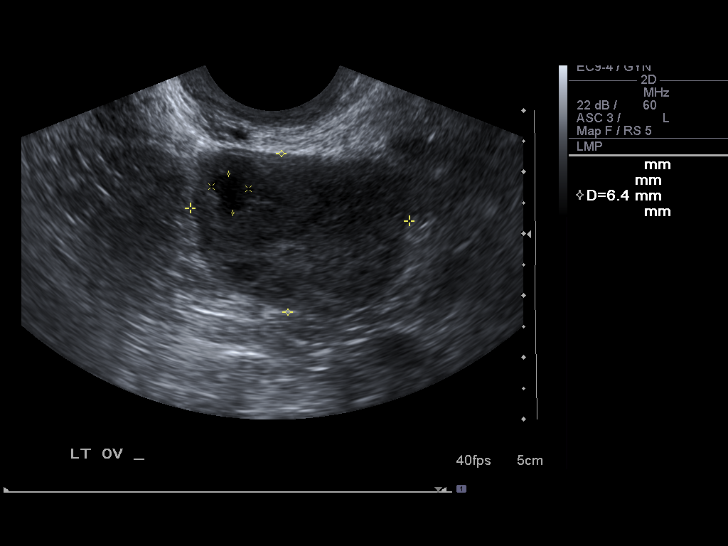
[im 41/55]
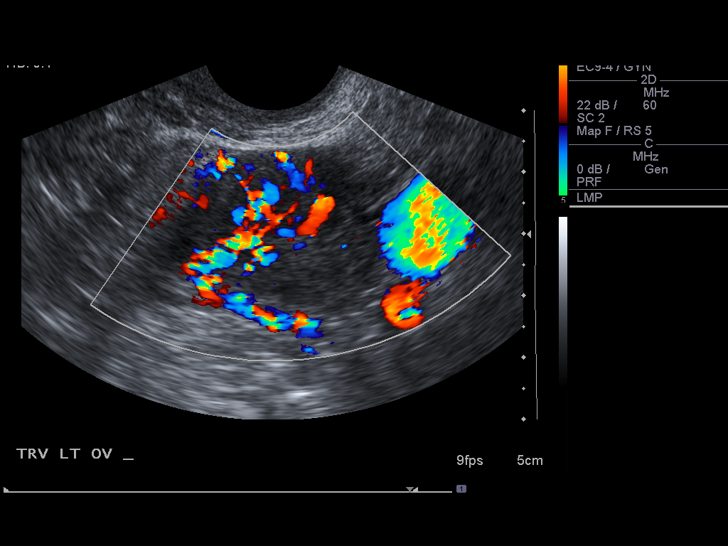
[im 46/55]
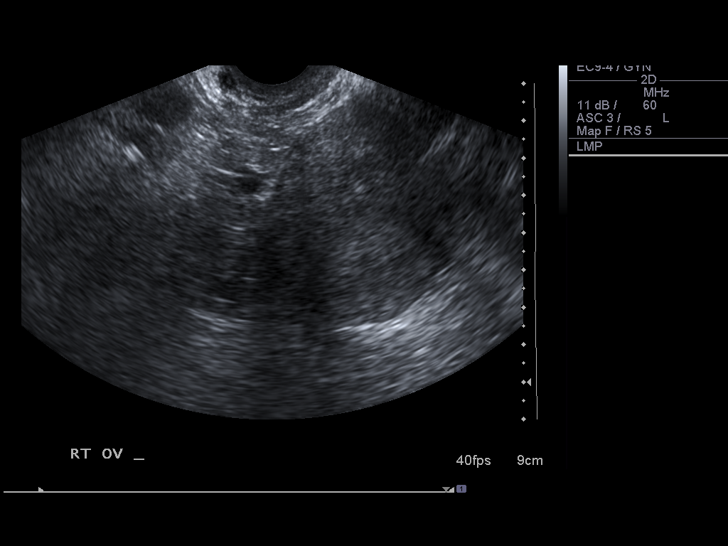
[im 50/55]
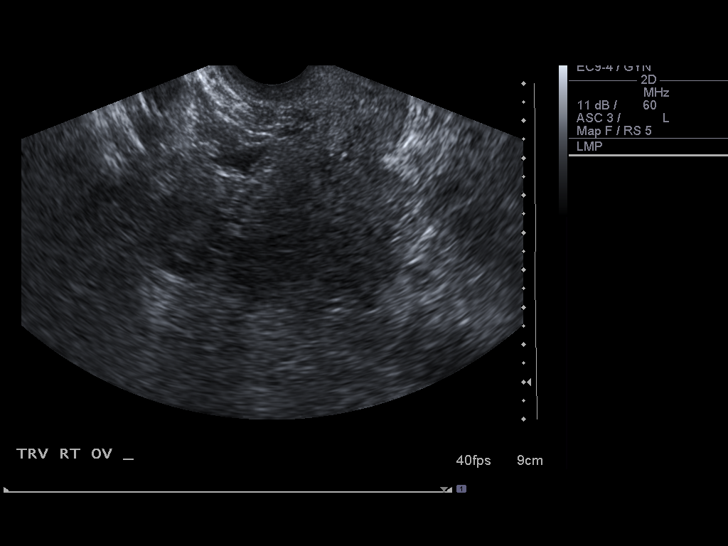
[im 55/55]
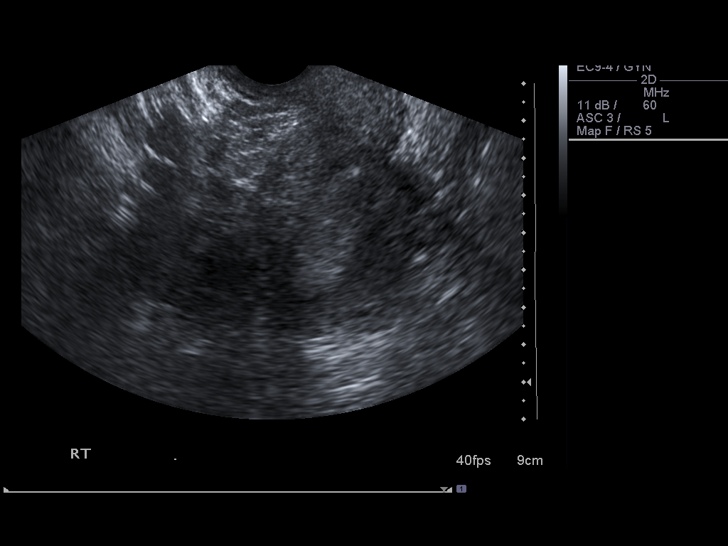

[13 of 25 positions shown; findings below may reference images not displayed]

FINDINGS: Uterus:  Retroverted and mildly heterogeneous.  Measures 8.2 x
x 5.0 cm.

Endometrium:  Normal in thickness and appearance, measuring 10 mm.

Right ovary: Poorly visualized.  Measures approximately 3.6 x 2.8 x
2.3 cm.

Left ovary:   Normal appearance/no adnexal mass, measuring 3.6 x
2.6 x 2.5 cm, notable for a corpus luteal cyst.

Additional comments:  Trace pelvic ascites.

Pulsed Doppler evaluation demonstrates normal low-resistance
arterial and venous waveforms in both ovaries.
IMPRESSION: Normal exam.  No evidence of pelvic mass or other significant
abnormality.

Left corpus luteal cyst.

No sonographic evidence for ovarian torsion.

## 2013-10-07 IMAGING — CT CT ABD-PELV W/ CM
2 of 4 series · 17 of 46 positions shown, 19 images · IV contrast (APPLIED)
Comparison: 08/28/2010

CLINICAL DATA: Left lower abdominal pain, hepatitis B, cocaine
withdrawal

CT ABDOMEN AND PELVIS WITH CONTRAST
TECHNIQUE: Multidetector CT imaging of the abdomen and pelvis was
performed following the standard protocol during bolus
administration of intravenous contrast.
Contrast: 100mL OMNIPAQUE IOHEXOL 300 MG/ML IV SOLN

[Series 2: abd/pelv with 5.0 b31f st · axial · 0.75mm/px · z∈[-498,-72]mm · 14 of 93 slices shown, 16 images]
[im 4/93  soft-tissue]
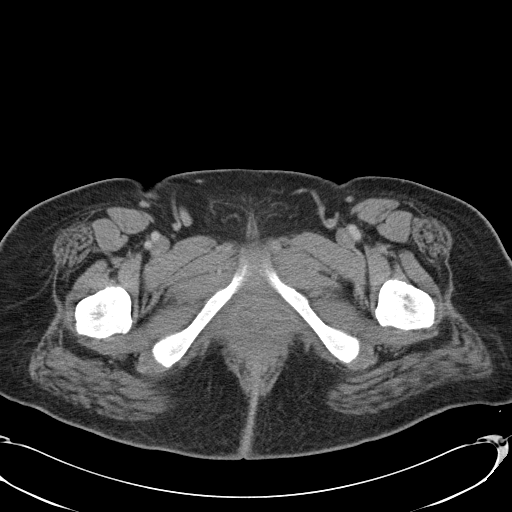
[im 4/93  bone]
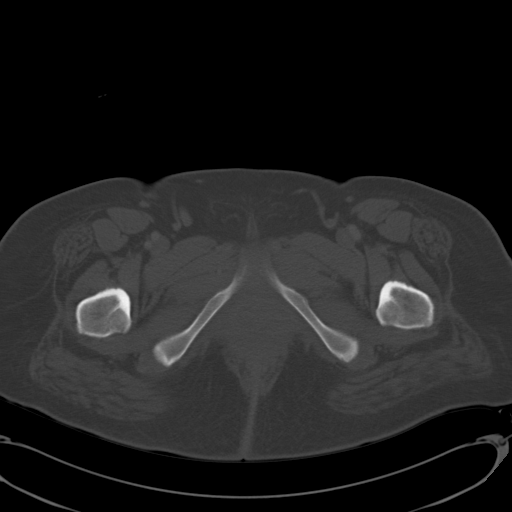
[im 11/93  soft-tissue]
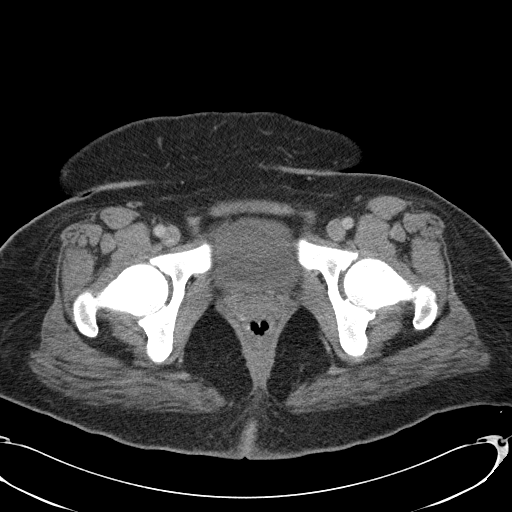
[im 18/93  soft-tissue]
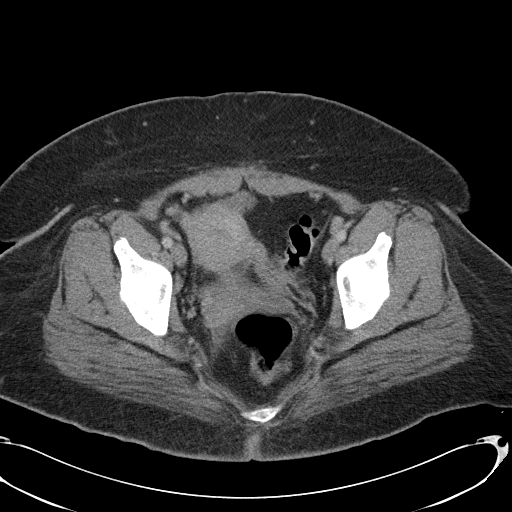
[im 25/93  soft-tissue]
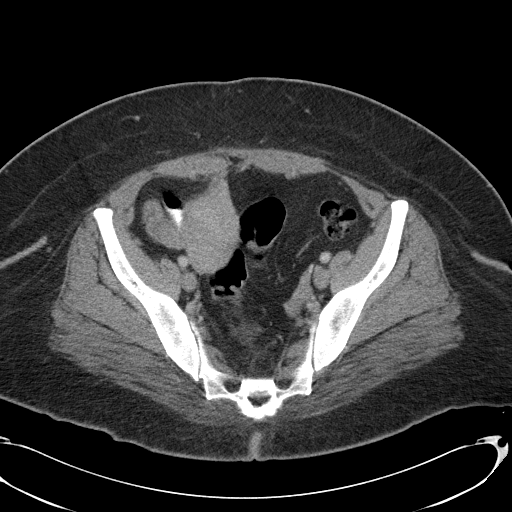
[im 32/93  soft-tissue]
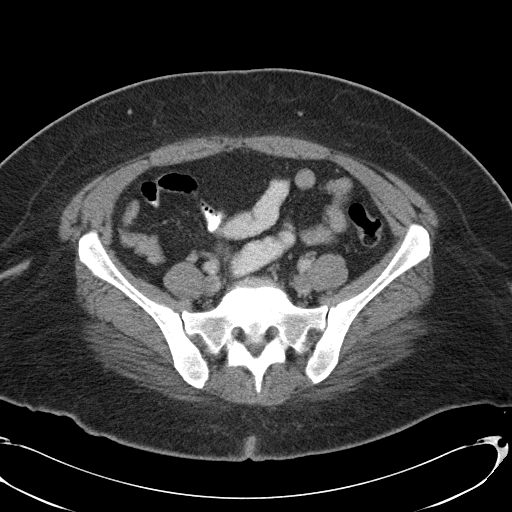
[im 36/93  soft-tissue]
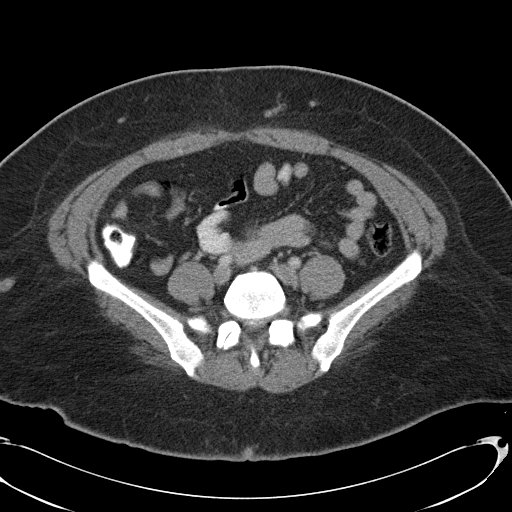
[im 43/93  soft-tissue]
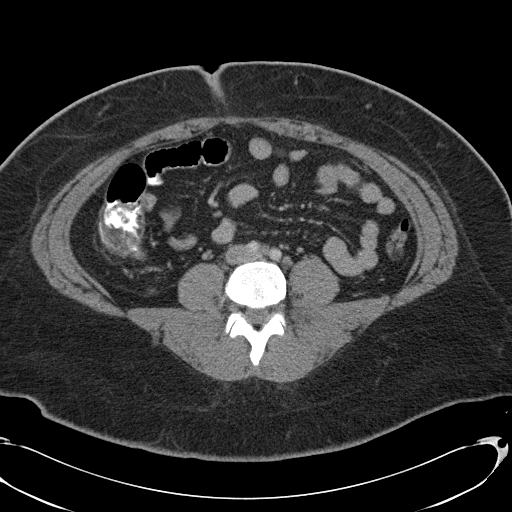
[im 50/93  soft-tissue]
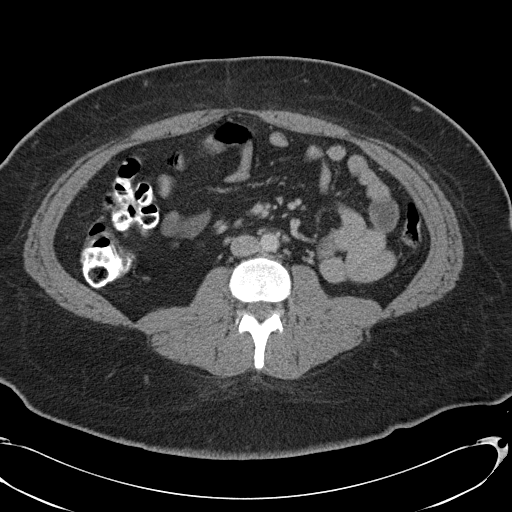
[im 57/93  soft-tissue]
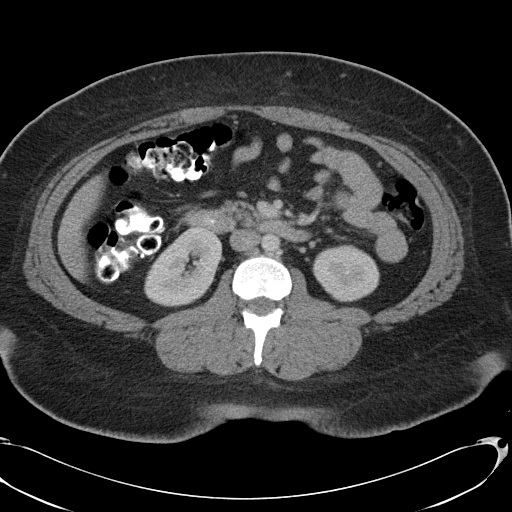
[im 57/93  bone]
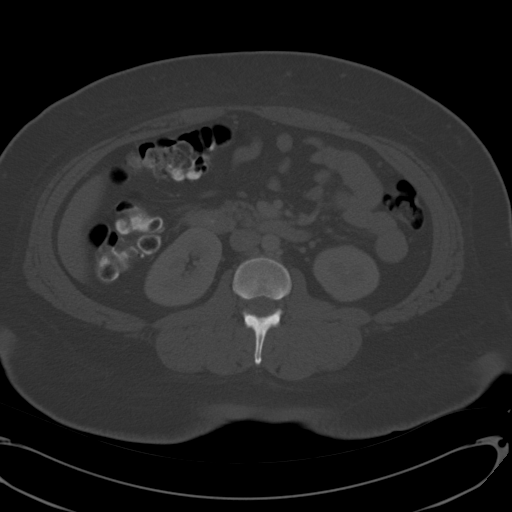
[im 61/93  soft-tissue]
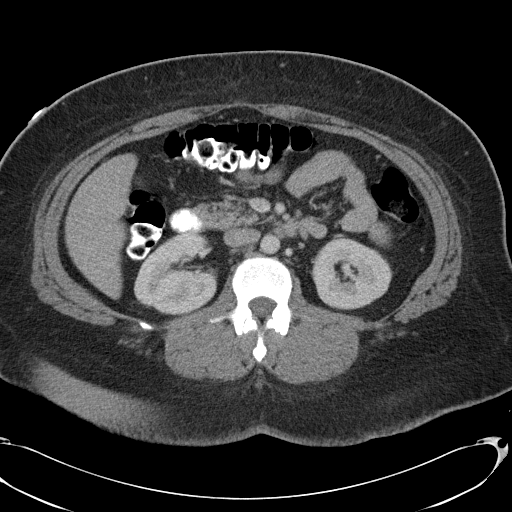
[im 68/93  soft-tissue]
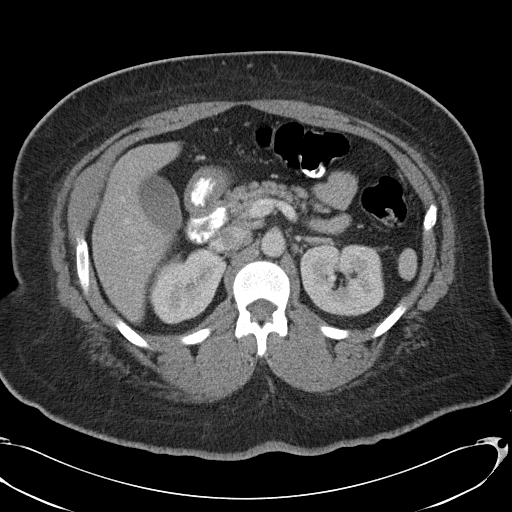
[im 75/93  soft-tissue]
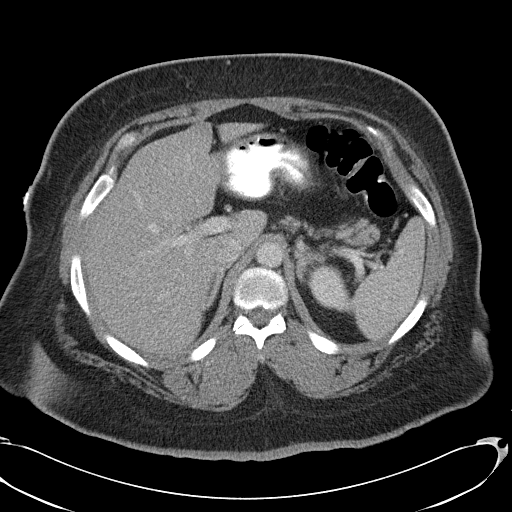
[im 82/93  soft-tissue]
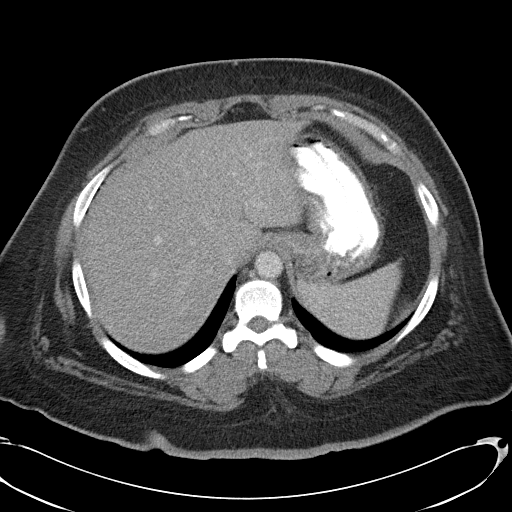
[im 89/93  soft-tissue]
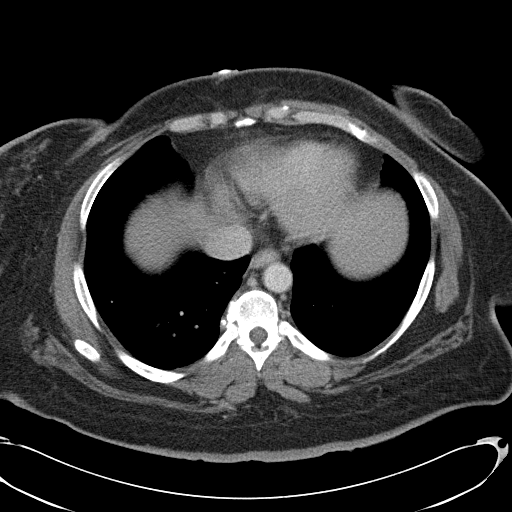

[Series 5: abd/pelv with 2.0 spo st · coronal · 0.90mm/px · 3 of 147 slices shown]
[im 49/147  soft-tissue]
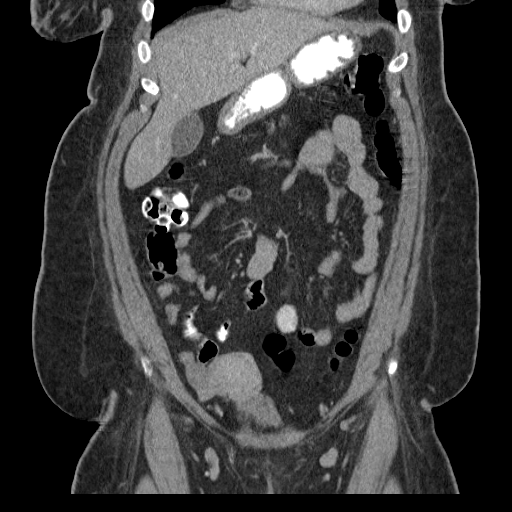
[im 65/147  soft-tissue]
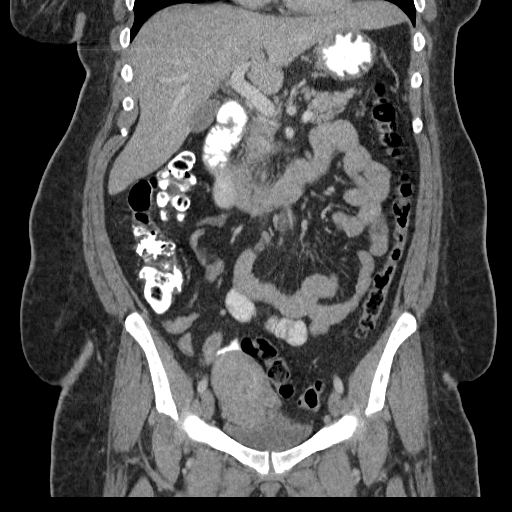
[im 82/147  soft-tissue]
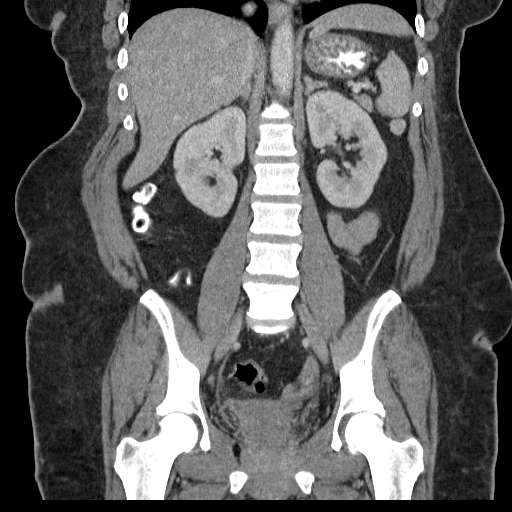

[17 of 46 positions shown; findings below may reference images not displayed]

FINDINGS: Lung bases are clear.

Liver, spleen, pancreas, and adrenal glands within normal limits.

Gallbladder is unremarkable.  No intrahepatic or extrahepatic
ductal dilatation.

Kidneys within normal limits.  No hydronephrosis.

No evidence of bowel obstruction.  Normal appendix.  No colonic
wall thickening or inflammatory changes.

No evidence of abdominal aortic aneurysm.

No abdominopelvic ascites.

No suspicious abdominopelvic lymphadenopathy.

Uterus and bilateral ovaries are unremarkable, noting a left corpus
luteal cyst.

Bladder is within normal limits.

Mild degenerative changes of the visualized thoracolumbar spine.
IMPRESSION: No evidence of bowel obstruction.  Normal appendix.

Left corpus luteal cyst.

Otherwise, no CT findings to account for the patient's abdominal
pain.

## 2013-10-09 NOTE — ED Provider Notes (Signed)
CSN: [id number]     Arrival date & time 09/29/13  2121 History   First MD Initiated Contact with Patient 09/29/13 2202     Chief Complaint  Patient presents with  . Chest Pain  . Abdominal Pain     (Consider location/radiation/quality/duration/timing/severity/associated sxs/prior Treatment) HPI  38yF with multiple compaints. Primarily CP and abdominal pain. Unable to tell me exact onset. "Maybe a couple days ago." Denies trauma. Pain in upper abdomen/lower sternal area. Constant. No appreciable exacerbating or relieving factors. Does not radiate. No cough. No SOB. Nausea and vomiting. No urinary complaints. No dizziness or lightheadedness. No unusual leg pain or swelling.   Past Medical History  Diagnosis Date  . Thrombocytosis     Hem/Onc suggested 2/2 chronic hepatits and/or iron deficiency anemia  . Iron deficiency anemia   . N&V (nausea and vomiting)     Chronic. Unclear etiology with multiple admission and ED visits. CT abdomen with and without contrast (02/2011)  showed no acute process. Gastic Emptying scan (01/2010) was normal. Ultrasound of the abdomen was within normal limits. Hepatitis B viral load was undectable. HIV NR. EGD - gastritis, Hpylori + s/p Rx  . Hypertension   . Hyperlipidemia   . Diabetes mellitus type 2, uncontrolled, with complications   . Recurrent boils   . Back pain   . OSA (obstructive sleep apnea)   . 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 Cath 12/10- no restenosis or significant CAD progression  . Irregular menses     Small ovarian follicles seen on [facility](9/81)  . History of pyelonephritis     H/o GrpB Pyelonephritis (9/06) and UTI- 07/11- E.Coli, 12/10- GBS  . Abscess of tunica vaginalis     10/09- Abundant S. aureus- sensitive to all abx  . Obesity   . Asthma   . Gastritis   . Depression   . Hepatitis B, chronic     Hep BeAb+,Hep  B cAb+ & Hep BsAg+ (9/06)  . Peripheral neuropathy   . Fibromyalgia   . GERD (gastroesophageal reflux disease)   . Migraine   . Anxiety   . Gastroparesis     secondary to poorly controlled DM, last emptying study performed 01/2010  was normal but may be falsely positive as pt was on reglan  . Blood dyscrasia   . H/O: CVA (cerebrovascular accident)     history of remote right cerebellar infarct noted on head CT at least since 10/2011  . Cancer   . MI (myocardial infarction)    Past Surgical History  Procedure Laterality Date  . Cesarean section  1997   Family History  Problem Relation Age of Onset  . Diabetes Father    History  Substance Use Topics  . Smoking status: Former Smoker    Types: Cigarettes    Quit date: 04/24/1996  . Smokeless tobacco: Never Used     Comment: quit smoking cigarettes age 58  . Alcohol Use: Yes     Comment: occasionally   OB History   Grav Para Term Preterm Abortions TAB SAB Ect Mult Living                 Review of Systems  All systems reviewed and negative, other than as noted in HPI.   Allergies  Lisinopril and Morphine and related  Home Medications   Prior to Admission medications   Medication Sig Start  Date End Date Taking? Authorizing Provider  acetaminophen (TYLENOL) 500 MG tablet Take 1,000 mg by mouth every 6 (six) hours as needed for moderate pain.   Yes Historical Provider, MD  albuterol (PROVENTIL HFA;VENTOLIN HFA) 108 (90 BASE) MCG/ACT inhaler Inhale 2 puffs into the lungs every 4 (four) hours as needed for wheezing or shortness of breath.   Yes Historical Provider, MD  aspirin EC 81 MG EC tablet Take 1 tablet (81 mg total) by mouth daily. 07/26/13  Yes Rebecca Eaton, MD  atorvastatin (LIPITOR) 80 MG tablet Take 1 tablet (80 mg total) by mouth daily at 6 PM. 07/25/13  Yes Rebecca Eaton, MD  calcium carbonate (TUMS - DOSED IN MG ELEMENTAL CALCIUM) 500 MG chewable tablet Chew 2 tablets by mouth daily.    Yes Historical  Provider, MD  gabapentin (NEURONTIN) 300 MG capsule Take 300 mg by mouth 3 (three) times daily as needed (pain).    Yes Historical Provider, MD  insulin aspart (NOVOLOG) 100 UNIT/ML injection Inject 2-12 Units into the skin 3 (three) times daily as needed for high blood sugar. Sliding scale- If sugar 150-199: inject 2 unit;  200-249: 4 units, 250-299: 7 units; 300-349: 10 units; Over 350: 12 units   Yes Historical Provider, MD  insulin glargine (LANTUS) 100 UNIT/ML injection Inject 50 Units into the skin at bedtime. 05/01/13  Yes Marjan Rabbani, MD  LORazepam (ATIVAN) 1 MG tablet Take 1 tablet (1 mg total) by mouth 3 (three) times daily as needed for anxiety. 05/20/13  Yes Garald Balding, NP  pregabalin (LYRICA) 75 MG capsule Take 1 capsule (75 mg total) by mouth 2 (two) times daily. 11/04/12  Yes Jeralene Huff, MD  labetalol (NORMODYNE) 100 MG tablet Take 1 tablet (100 mg total) by mouth 2 (two) times daily. 08/27/13   Neema Bobbie Stack, MD  LANTUS SOLOSTAR 100 UNIT/ML Solostar Pen INJECT 50 UNITS UNDER THE SKIN EVERY NIGHT AT BEDTIME 10/01/13   Jeralene Huff, MD   BP 121/68  Pulse 97  Temp(Src) 98.6 F (37 C) (Oral)  Resp 18  SpO2 98%  LMP 09/22/2013 Physical Exam  Nursing note and vitals reviewed. Constitutional: She appears well-developed and well-nourished. No distress.  HENT:  Head: Normocephalic and atraumatic.  Eyes: Conjunctivae are normal. Right eye exhibits no discharge. Left eye exhibits no discharge.  Neck: Neck supple.  Cardiovascular: Regular rhythm and normal heart sounds.  Exam reveals no gallop and no friction rub.   No murmur heard. Mild tachycardia  Pulmonary/Chest: Effort normal and breath sounds normal. No respiratory distress. She exhibits no tenderness.  Abdominal: Soft. She exhibits no distension. There is no tenderness.  Musculoskeletal: She exhibits no edema and no tenderness.  Lower extremities symmetric as compared to each other. No calf tenderness.  Negative Homan's. No palpable cords.   Neurological: She is alert.  Skin: Skin is warm and dry.  Psychiatric: She has a normal mood and affect. Her behavior is normal. Thought content normal.    ED Course  Procedures (including critical care time) Labs Review Labs Reviewed  CBC WITH DIFFERENTIAL - Abnormal; Notable for the following:    WBC 15.8 (*)    RBC 5.22 (*)    Hemoglobin 10.9 (*)    HCT 33.5 (*)    MCV 64.2 (*)    MCH 20.9 (*)    RDW 18.3 (*)    Platelets 633 (*)    Neutro Abs 10.3 (*)    Monocytes Absolute 1.3 (*)  All other components within normal limits  BASIC METABOLIC PANEL - Abnormal; Notable for the following:    Glucose, Bld 141 (*)    GFR calc non Af Amer 76 (*)    GFR calc Af Amer 89 (*)    All other components within normal limits  URINALYSIS, ROUTINE W REFLEX MICROSCOPIC - Abnormal; Notable for the following:    Color, Urine AMBER (*)    APPearance CLOUDY (*)    Specific Gravity, Urine 1.031 (*)    Glucose, UA 250 (*)    Bilirubin Urine SMALL (*)    Ketones, ur 40 (*)    Protein, ur 30 (*)    All other components within normal limits  BLOOD GAS, ARTERIAL - Abnormal; Notable for the following:    pH, Arterial 7.470 (*)    pCO2 arterial 31.6 (*)    All other components within normal limits  URINE MICROSCOPIC-ADD ON - Abnormal; Notable for the following:    Squamous Epithelial / LPF FEW (*)    Casts HYALINE CASTS (*)    All other components within normal limits  TROPONIN I    Imaging Review No results found.   EKG Interpretation   Date/Time:  Monday September 29 2013 21:28:09 EDT Ventricular Rate:  138 PR Interval:  110 QRS Duration: 67 QT Interval:  350 QTC Calculation: 530 R Axis:   47 Text Interpretation:  Sinus tachycardia Multiple ventricular premature  complexes Probable left atrial enlargement Borderline T abnormalities,  diffuse leads Prolonged QT interval ED PHYSICIAN INTERPRETATION AVAILABLE  IN [facility] Confirmed by  TEST, Record ([id number]) on 10/01/2013 7:25:08  AM      MDM   Final diagnoses:  Atypical chest pain  Abdominal pain  Nausea and vomiting        Virgel Manifold, MD 10/09/13 [phone]

## 2013-10-14 ENCOUNTER — Encounter: Payer: Self-pay | Admitting: Internal Medicine

## 2013-10-15 ENCOUNTER — Emergency Department (HOSPITAL_COMMUNITY)
Admission: EM | Admit: 2013-10-15 | Discharge: 2013-10-15 | Disposition: A | Discharge status: Home / Self Care | Payer: Medicaid Other | Attending: Emergency Medicine | Admitting: Emergency Medicine

## 2013-10-15 ENCOUNTER — Encounter (HOSPITAL_COMMUNITY): Payer: Self-pay | Admitting: Emergency Medicine

## 2013-10-15 DIAGNOSIS — E785 Hyperlipidemia, unspecified: Secondary | ICD-10-CM | POA: Insufficient documentation

## 2013-10-15 DIAGNOSIS — D75839 Thrombocytosis, unspecified: Secondary | ICD-10-CM

## 2013-10-15 DIAGNOSIS — Z862 Personal history of diseases of the blood and blood-forming organs and certain disorders involving the immune mechanism: Secondary | ICD-10-CM | POA: Insufficient documentation

## 2013-10-15 DIAGNOSIS — I2510 Atherosclerotic heart disease of native coronary artery without angina pectoris: Secondary | ICD-10-CM | POA: Insufficient documentation

## 2013-10-15 DIAGNOSIS — I252 Old myocardial infarction: Secondary | ICD-10-CM | POA: Insufficient documentation

## 2013-10-15 DIAGNOSIS — Z8742 Personal history of other diseases of the female genital tract: Secondary | ICD-10-CM | POA: Insufficient documentation

## 2013-10-15 DIAGNOSIS — Z872 Personal history of diseases of the skin and subcutaneous tissue: Secondary | ICD-10-CM | POA: Insufficient documentation

## 2013-10-15 DIAGNOSIS — R5383 Other fatigue: Secondary | ICD-10-CM

## 2013-10-15 DIAGNOSIS — E118 Type 2 diabetes mellitus with unspecified complications: Secondary | ICD-10-CM

## 2013-10-15 DIAGNOSIS — R142 Eructation: Secondary | ICD-10-CM

## 2013-10-15 DIAGNOSIS — R143 Flatulence: Secondary | ICD-10-CM

## 2013-10-15 DIAGNOSIS — D473 Essential (hemorrhagic) thrombocythemia: Secondary | ICD-10-CM | POA: Insufficient documentation

## 2013-10-15 DIAGNOSIS — R000 Tachycardia, unspecified: Secondary | ICD-10-CM | POA: Insufficient documentation

## 2013-10-15 DIAGNOSIS — F411 Generalized anxiety disorder: Secondary | ICD-10-CM | POA: Insufficient documentation

## 2013-10-15 DIAGNOSIS — R109 Unspecified abdominal pain: Secondary | ICD-10-CM

## 2013-10-15 DIAGNOSIS — G8929 Other chronic pain: Secondary | ICD-10-CM

## 2013-10-15 DIAGNOSIS — E1165 Type 2 diabetes mellitus with hyperglycemia: Secondary | ICD-10-CM | POA: Insufficient documentation

## 2013-10-15 DIAGNOSIS — Z794 Long term (current) use of insulin: Secondary | ICD-10-CM | POA: Insufficient documentation

## 2013-10-15 DIAGNOSIS — J45909 Unspecified asthma, uncomplicated: Secondary | ICD-10-CM | POA: Insufficient documentation

## 2013-10-15 DIAGNOSIS — IMO0002 Reserved for concepts with insufficient information to code with codable children: Secondary | ICD-10-CM | POA: Insufficient documentation

## 2013-10-15 DIAGNOSIS — Z79899 Other long term (current) drug therapy: Secondary | ICD-10-CM | POA: Insufficient documentation

## 2013-10-15 DIAGNOSIS — R112 Nausea with vomiting, unspecified: Secondary | ICD-10-CM | POA: Insufficient documentation

## 2013-10-15 DIAGNOSIS — Z87891 Personal history of nicotine dependence: Secondary | ICD-10-CM | POA: Insufficient documentation

## 2013-10-15 DIAGNOSIS — R6883 Chills (without fever): Secondary | ICD-10-CM | POA: Insufficient documentation

## 2013-10-15 DIAGNOSIS — Z3202 Encounter for pregnancy test, result negative: Secondary | ICD-10-CM | POA: Insufficient documentation

## 2013-10-15 DIAGNOSIS — Z8719 Personal history of other diseases of the digestive system: Secondary | ICD-10-CM | POA: Insufficient documentation

## 2013-10-15 DIAGNOSIS — Z8673 Personal history of transient ischemic attack (TIA), and cerebral infarction without residual deficits: Secondary | ICD-10-CM | POA: Insufficient documentation

## 2013-10-15 DIAGNOSIS — G609 Hereditary and idiopathic neuropathy, unspecified: Secondary | ICD-10-CM | POA: Insufficient documentation

## 2013-10-15 DIAGNOSIS — G43909 Migraine, unspecified, not intractable, without status migrainosus: Secondary | ICD-10-CM | POA: Insufficient documentation

## 2013-10-15 DIAGNOSIS — R1032 Left lower quadrant pain: Secondary | ICD-10-CM | POA: Insufficient documentation

## 2013-10-15 DIAGNOSIS — Z859 Personal history of malignant neoplasm, unspecified: Secondary | ICD-10-CM | POA: Insufficient documentation

## 2013-10-15 DIAGNOSIS — I10 Essential (primary) hypertension: Secondary | ICD-10-CM | POA: Insufficient documentation

## 2013-10-15 DIAGNOSIS — R141 Gas pain: Secondary | ICD-10-CM | POA: Insufficient documentation

## 2013-10-15 DIAGNOSIS — E669 Obesity, unspecified: Secondary | ICD-10-CM | POA: Insufficient documentation

## 2013-10-15 DIAGNOSIS — R5381 Other malaise: Secondary | ICD-10-CM | POA: Insufficient documentation

## 2013-10-15 DIAGNOSIS — R1031 Right lower quadrant pain: Secondary | ICD-10-CM | POA: Insufficient documentation

## 2013-10-15 DIAGNOSIS — Z8619 Personal history of other infectious and parasitic diseases: Secondary | ICD-10-CM | POA: Insufficient documentation

## 2013-10-15 LAB — CBC WITH DIFFERENTIAL/PLATELET
Basophils Absolute: 0.1 10*3/uL (ref 0.0–0.1)
Basophils Relative: 0 % (ref 0–1)
EOS ABS: 0.4 10*3/uL (ref 0.0–0.7)
EOS PCT: 3 % (ref 0–5)
HCT: 31.9 % — ABNORMAL LOW (ref 36.0–46.0)
Hemoglobin: 10.4 g/dL — ABNORMAL LOW (ref 12.0–15.0)
Lymphocytes Relative: 18 % (ref 12–46)
Lymphs Abs: 3 10*3/uL (ref 0.7–4.0)
MCH: 20.5 pg — AB (ref 26.0–34.0)
MCHC: 32.6 g/dL (ref 30.0–36.0)
MCV: 62.8 fL — ABNORMAL LOW (ref 78.0–100.0)
Monocytes Absolute: 0.9 10*3/uL (ref 0.1–1.0)
Monocytes Relative: 6 % (ref 3–12)
NEUTROS PCT: 73 % (ref 43–77)
Neutro Abs: 11.8 10*3/uL — ABNORMAL HIGH (ref 1.7–7.7)
PLATELETS: 551 10*3/uL — AB (ref 150–400)
RBC: 5.08 MIL/uL (ref 3.87–5.11)
RDW: 18.3 % — ABNORMAL HIGH (ref 11.5–15.5)
WBC: 16.1 10*3/uL — ABNORMAL HIGH (ref 4.0–10.5)

## 2013-10-15 LAB — URINALYSIS, ROUTINE W REFLEX MICROSCOPIC
Bilirubin Urine: NEGATIVE
Hgb urine dipstick: NEGATIVE
Ketones, ur: 40 mg/dL — AB
LEUKOCYTES UA: NEGATIVE
Nitrite: NEGATIVE
PROTEIN: NEGATIVE mg/dL
Specific Gravity, Urine: 1.022 (ref 1.005–1.030)
UROBILINOGEN UA: 1 mg/dL (ref 0.0–1.0)
pH: 6.5 (ref 5.0–8.0)

## 2013-10-15 LAB — COMPREHENSIVE METABOLIC PANEL
AST: 12 U/L (ref 0–37)
Albumin: 3.4 g/dL — ABNORMAL LOW (ref 3.5–5.2)
Alkaline Phosphatase: 64 U/L (ref 39–117)
BUN: 10 mg/dL (ref 6–23)
CO2: 19 mEq/L (ref 19–32)
CREATININE: 0.81 mg/dL (ref 0.50–1.10)
Calcium: 9.5 mg/dL (ref 8.4–10.5)
Chloride: 97 mEq/L (ref 96–112)
GFR calc non Af Amer: 90 mL/min — ABNORMAL LOW (ref >90)
Glucose, Bld: 216 mg/dL — ABNORMAL HIGH (ref 70–99)
Potassium: 3.8 mEq/L (ref 3.7–5.3)
SODIUM: 134 meq/L — AB (ref 137–147)
TOTAL PROTEIN: 8.2 g/dL (ref 6.0–8.3)
Total Bilirubin: 0.6 mg/dL (ref 0.3–1.2)

## 2013-10-15 LAB — BLOOD GAS, VENOUS
Acid-base deficit: 0.6 mmol/L (ref 0.0–2.0)
Bicarbonate: 24.8 mEq/L — ABNORMAL HIGH (ref 20.0–24.0)
FIO2: 0.21 %
O2 Saturation: 62.1 %
PATIENT TEMPERATURE: 98.6
PCO2 VEN: 46.2 mmHg (ref 45.0–50.0)
TCO2: 22.3 mmol/L (ref 0–100)
pH, Ven: 7.35 — ABNORMAL HIGH (ref 7.250–7.300)
pO2, Ven: 38 mmHg (ref 30.0–45.0)

## 2013-10-15 LAB — POC URINE PREG, ED: PREG TEST UR: NEGATIVE

## 2013-10-15 LAB — LIPASE, BLOOD: LIPASE: 31 U/L (ref 11–59)

## 2013-10-15 LAB — WET PREP, GENITAL
Clue Cells Wet Prep HPF POC: NONE SEEN
Trich, Wet Prep: NONE SEEN
YEAST WET PREP: NONE SEEN

## 2013-10-15 LAB — URINE MICROSCOPIC-ADD ON

## 2013-10-15 MED ORDER — ONDANSETRON HCL 4 MG PO TABS: 4.0000 mg | ORAL_TABLET | Freq: Four times a day (QID) | ORAL | Status: DC

## 2013-10-15 MED ORDER — SODIUM CHLORIDE 0.9 % IV BOLUS (SEPSIS): 1000.0000 mL | Freq: Once | INTRAVENOUS | Status: DC

## 2013-10-15 MED ORDER — ONDANSETRON 8 MG PO TBDP
8.0000 mg | ORAL_TABLET | Freq: Once | ORAL | Status: AC
  Administered 2013-10-15: 8 mg via ORAL
  Filled 2013-10-15: qty 1

## 2013-10-15 MED ORDER — HYDROMORPHONE HCL PF 1 MG/ML IJ SOLN
1.0000 mg | Freq: Once | INTRAMUSCULAR | Status: AC
  Administered 2013-10-15: 1 mg via INTRAVENOUS
  Filled 2013-10-15: qty 1

## 2013-10-15 MED ORDER — SODIUM CHLORIDE 0.9 % IV BOLUS (SEPSIS)
500.0000 mL | Freq: Once | INTRAVENOUS | Status: AC
  Administered 2013-10-15: 500 mL via INTRAVENOUS

## 2013-10-15 MED ORDER — PROMETHAZINE HCL 25 MG/ML IJ SOLN
12.5000 mg | Freq: Once | INTRAMUSCULAR | Status: AC
  Administered 2013-10-15: 12.5 mg via INTRAVENOUS
  Filled 2013-10-15: qty 1

## 2013-10-15 NOTE — ED Notes (Signed)
Pt c/o lower abdominal pain and intermittent n/v x 5 days.  Pain score 10/10.  Denies diarrhea.  Pt has not taken any medications to try to relieve symptoms.  Pt reports "I need to get a new GI doctor."

## 2013-10-15 NOTE — ED Notes (Signed)
Will wait to do in and out once pain medication is giver. Patient is in too much pain.

## 2013-10-15 NOTE — ED Notes (Signed)
Patient will try to urinate in a few and I will set up pelvic exam

## 2013-10-15 NOTE — ED Notes (Signed)
Patient wants to wait a [place] longer for meds to kick in before doing In and Out

## 2013-10-15 NOTE — ED Notes (Signed)
Pt was observed by this RN entering department w/o difficulty.  Pt began dry heaving loudly, moaning, and acting restless when put in a Triage room.

## 2013-10-15 NOTE — Discharge Instructions (Signed)

## 2013-10-15 NOTE — ED Provider Notes (Signed)
CSN: [id number]     Arrival date & time 10/15/13  1430 History   First MD Initiated Contact with Patient 10/15/13 1637     Chief Complaint  Patient presents with  . Abdominal Pain  . Emesis   Patient is a 38 y.o. female presenting with abdominal pain and vomiting. The history is provided by the patient. No language interpreter was used.  Abdominal Pain Pain location:  Suprapubic, RLQ and LLQ Pain quality: sharp and stabbing   Pain radiates to:  Does not radiate Pain severity:  Severe Onset quality:  Gradual Duration:  5 days Timing:  Constant Progression:  Worsening Chronicity:  Recurrent Context: eating   Context: not alcohol use, not awakening from sleep, not diet changes, not laxative use, not medication withdrawal, not previous surgeries, not recent illness, not recent sexual activity, not recent travel, not retching, not sick contacts, not suspicious food intake and not trauma   Relieved by:  Nothing Worsened by:  Movement, eating and position changes Ineffective treatments:  Not moving Associated symptoms: chills, fatigue, flatus, nausea and vomiting   Associated symptoms: no anorexia, no belching, no chest pain, no constipation, no cough, no diarrhea, no dysuria, no fever, no hematemesis, no hematochezia, no hematuria, no melena, no shortness of breath, no sore throat, no vaginal bleeding and no vaginal discharge   Nausea:    Severity:  Severe   Onset quality:  Gradual   Duration:  5 days   Timing:  Constant   Progression:  Worsening Vomiting:    Quality:  Stomach contents   Number of occurrences:  4-5 times per day   Severity:  Severe   Duration:  5 days   Timing:  Constant   Progression:  Worsening Risk factors: multiple surgeries and obesity   Risk factors: no alcohol abuse, no aspirin use, no NSAID use, not pregnant and no recent hospitalization   Emesis Associated symptoms: abdominal pain and chills   Associated symptoms: no diarrhea and no sore throat      Past Medical History  Diagnosis Date  . Thrombocytosis     Hem/Onc suggested 2/2 chronic hepatits and/or iron deficiency anemia  . Iron deficiency anemia   . N&V (nausea and vomiting)     Chronic. Unclear etiology with multiple admission and ED visits. CT abdomen with and without contrast (02/2011)  showed no acute process. Gastic Emptying scan (01/2010) was normal. Ultrasound of the abdomen was within normal limits. Hepatitis B viral load was undectable. HIV NR. EGD - gastritis, Hpylori + s/p Rx  . Hypertension   . Hyperlipidemia   . Diabetes mellitus type 2, uncontrolled, with complications   . Recurrent boils   . Back pain   . OSA (obstructive sleep apnea)   . 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 Cath 12/10- no restenosis or significant CAD progression  . Irregular menses     Small ovarian follicles seen on OM(7/67)  . History of pyelonephritis     H/o GrpB Pyelonephritis (9/06) and UTI- 07/11- E.Coli, 12/10- GBS  . Abscess of tunica vaginalis     10/09- Abundant S. aureus- sensitive to all abx  . Obesity   . Asthma   . Gastritis   . Depression   . Hepatitis B, chronic     Hep BeAb+,Hep B cAb+ & Hep BsAg+ (9/06)  . Peripheral neuropathy   . Fibromyalgia   .  GERD (gastroesophageal reflux disease)   . Migraine   . Anxiety   . Gastroparesis     secondary to poorly controlled DM, last emptying study performed 01/2010  was normal but may be falsely positive as pt was on reglan  . Blood dyscrasia   . H/O: CVA (cerebrovascular accident)     history of remote right cerebellar infarct noted on head CT at least since 10/2011  . Cancer   . MI (myocardial infarction)    Past Surgical History  Procedure Laterality Date  . Cesarean section  1997   Family History  Problem Relation Age of Onset  . Diabetes Father    History  Substance Use Topics  . Smoking  status: Former Smoker    Types: Cigarettes    Quit date: 04/24/1996  . Smokeless tobacco: Never Used     Comment: quit smoking cigarettes age 59  . Alcohol Use: Yes     Comment: occasionally   OB History   Grav Para Term Preterm Abortions TAB SAB Ect Mult Living                 Review of Systems  Constitutional: Positive for chills and fatigue. Negative for fever.  HENT: Negative for sore throat.   Respiratory: Negative for cough and shortness of breath.   Cardiovascular: Negative for chest pain.  Gastrointestinal: Positive for nausea, vomiting, abdominal pain and flatus. Negative for diarrhea, constipation, melena, hematochezia, anorexia and hematemesis.  Genitourinary: Negative for dysuria, hematuria, vaginal bleeding and vaginal discharge.      Allergies  Lisinopril and Morphine and related  Home Medications   Prior to Admission medications   Medication Sig Start Date End Date Taking? Authorizing Provider  albuterol (PROVENTIL HFA;VENTOLIN HFA) 108 (90 BASE) MCG/ACT inhaler Inhale 2 puffs into the lungs every 4 (four) hours as needed for wheezing or shortness of breath.   Yes Historical Provider, MD  atorvastatin (LIPITOR) 80 MG tablet Take 1 tablet (80 mg total) by mouth daily at 6 PM. 07/25/13  Yes Rebecca Eaton, MD  gabapentin (NEURONTIN) 300 MG capsule Take 300 mg by mouth 3 (three) times daily as needed (pain).    Yes Historical Provider, MD  insulin aspart (NOVOLOG) 100 UNIT/ML injection Inject 2-12 Units into the skin 3 (three) times daily as needed for high blood sugar. Sliding scale- If sugar 150-199: inject 2 unit;  200-249: 4 units, 250-299: 7 units; 300-349: 10 units; Over 350: 12 units   Yes Historical Provider, MD  insulin glargine (LANTUS) 100 UNIT/ML injection Inject 50 Units into the skin at bedtime. 05/01/13  Yes Marjan Rabbani, MD  LORazepam (ATIVAN) 1 MG tablet Take 1 tablet (1 mg total) by mouth 3 (three) times daily as needed for anxiety. 05/20/13  Yes Garald Balding, NP  pregabalin (LYRICA) 75 MG capsule Take 1 capsule (75 mg total) by mouth 2 (two) times daily. 11/04/12  Yes Jeralene Huff, MD  labetalol (NORMODYNE) 100 MG tablet Take 1 tablet (100 mg total) by mouth 2 (two) times daily. 08/27/13   Neema Bobbie Stack, MD  ondansetron (ZOFRAN) 4 MG tablet Take 1 tablet (4 mg total) by mouth every 6 (six) hours. 10/15/13   Courtney A Forucci, PA-C   BP 137/83  Pulse 57  Temp(Src) 98.1 F (36.7 C) (Oral)  Resp 18  SpO2 100%  LMP 09/22/2013 Physical Exam  Nursing note and vitals reviewed. Constitutional: She is oriented to person, place, and time. She appears well-developed and well-nourished. She  appears distressed.  Tearful on the bed and shaking  HENT:  Head: Normocephalic and atraumatic.  Mouth/Throat: Oropharynx is clear and moist. No oropharyngeal exudate.  Eyes: Conjunctivae and EOM are normal. Pupils are equal, round, and reactive to light. No scleral icterus.  Neck: Normal range of motion. Neck supple. No JVD present. No tracheal deviation present. No thyromegaly present.  Cardiovascular: Regular rhythm, normal heart sounds and intact distal pulses.  Tachycardia present.  Exam reveals no gallop.   No murmur heard. Pulmonary/Chest: Effort normal and breath sounds normal. No respiratory distress. She has no wheezes. She has no rales. She exhibits no tenderness.  Abdominal: Soft. Bowel sounds are normal. She exhibits no distension and no mass. There is tenderness in the right lower quadrant, suprapubic area and left lower quadrant. There is no rebound, no guarding, no CVA tenderness, no tenderness at McBurney's point and negative Murphy's sign.  Obese appearing abdomen  Musculoskeletal: Normal range of motion.  Lymphadenopathy:    She has no cervical adenopathy.  Neurological: She is alert and oriented to person, place, and time. No cranial nerve deficit. Coordination normal.  Skin: Skin is warm and dry. She is not diaphoretic.   Psychiatric: She has a normal mood and affect. Her behavior is normal. Judgment and thought content normal.    ED Course  Procedures (including critical care time) Labs Review Labs Reviewed  WET PREP, GENITAL - Abnormal; Notable for the following:    WBC, Wet Prep HPF POC RARE (*)    All other components within normal limits  CBC WITH DIFFERENTIAL - Abnormal; Notable for the following:    WBC 16.1 (*)    Hemoglobin 10.4 (*)    HCT 31.9 (*)    MCV 62.8 (*)    MCH 20.5 (*)    RDW 18.3 (*)    Platelets 551 (*)    Neutro Abs 11.8 (*)    All other components within normal limits  COMPREHENSIVE METABOLIC PANEL - Abnormal; Notable for the following:    Sodium 134 (*)    Glucose, Bld 216 (*)    Albumin 3.4 (*)    GFR calc non Af Amer 90 (*)    All other components within normal limits  URINALYSIS, ROUTINE W REFLEX MICROSCOPIC - Abnormal; Notable for the following:    Glucose, UA >1000 (*)    Ketones, ur 40 (*)    All other components within normal limits  BLOOD GAS, VENOUS - Abnormal; Notable for the following:    pH, Ven 7.350 (*)    Bicarbonate 24.8 (*)    All other components within normal limits  GC/CHLAMYDIA PROBE AMP  LIPASE, BLOOD  URINE MICROSCOPIC-ADD ON  POC URINE PREG, ED    Imaging Review No results found.   EKG Interpretation None      MDM   Final diagnoses:  Chronic abdominal pain  Thrombocytosis   Patient presents to the [facility] ED with complaint of nausea, vomiting, and abdominal pain.  History and physical exam at this time appears to be similar to chronic abdominal pain.  Basic labs have been drawn here which show stable leukocytosis from her last visit, stable thrombocytosis, and stable UA with glucosuria and ketones which is likely a sign of dehydration.  There is no evidence of any pancreatitis at this time or UTI.  Vaginal exam shows a menstruating female with no evidence of infection or CMT at this time.  I have treated her pain and nausea and  vomiting in the  ED with 3 mg diluaded and phenergan, and 1 L of fluids here in the ED.  She states that her pain is still a 7-8/10 but the nurse reports having to wake her up to give her the third dose of dilaudid.  She has still not seen GI at this time and cannot be seen by Dr. Benson Norway as she has missed too many appointments.  I have spoken with Dr. Regenia Skeeter who agrees with me at this time that further imaging is not appropriate at this time.  I will discharge the patient home with zofran and will also refer her to [facility].       Kenard Gower, PA-C 10/15/13 2106

## 2013-10-16 LAB — GC/CHLAMYDIA PROBE AMP
CT Probe RNA: NEGATIVE
GC Probe RNA: NEGATIVE

## 2013-10-16 NOTE — ED Provider Notes (Signed)
Medical screening examination/treatment/procedure(s) were performed by non-physician practitioner and as supervising physician I was immediately available for consultation/collaboration.   EKG Interpretation None        Ephraim Hamburger, MD 10/16/13 1527

## 2013-10-21 NOTE — Addendum Note (Signed)
Addended by: Truddie Crumble on: 10/21/2013 11:10 AM   Modules accepted: Orders

## 2013-11-09 ENCOUNTER — Emergency Department (HOSPITAL_COMMUNITY): Payer: Medicaid Other

## 2013-11-09 ENCOUNTER — Emergency Department (HOSPITAL_COMMUNITY)
Admission: EM | Admit: 2013-11-09 | Discharge: 2013-11-10 | Disposition: A | Discharge status: Home / Self Care | Payer: Medicaid Other | Attending: Emergency Medicine | Admitting: Emergency Medicine

## 2013-11-09 ENCOUNTER — Encounter (HOSPITAL_COMMUNITY): Payer: Self-pay | Admitting: Emergency Medicine

## 2013-11-09 DIAGNOSIS — Z9861 Coronary angioplasty status: Secondary | ICD-10-CM | POA: Diagnosis not present

## 2013-11-09 DIAGNOSIS — Z872 Personal history of diseases of the skin and subcutaneous tissue: Secondary | ICD-10-CM | POA: Insufficient documentation

## 2013-11-09 DIAGNOSIS — F411 Generalized anxiety disorder: Secondary | ICD-10-CM | POA: Insufficient documentation

## 2013-11-09 DIAGNOSIS — R079 Chest pain, unspecified: Secondary | ICD-10-CM | POA: Insufficient documentation

## 2013-11-09 DIAGNOSIS — Z794 Long term (current) use of insulin: Secondary | ICD-10-CM | POA: Insufficient documentation

## 2013-11-09 DIAGNOSIS — IMO0001 Reserved for inherently not codable concepts without codable children: Secondary | ICD-10-CM | POA: Diagnosis not present

## 2013-11-09 DIAGNOSIS — Z862 Personal history of diseases of the blood and blood-forming organs and certain disorders involving the immune mechanism: Secondary | ICD-10-CM | POA: Diagnosis not present

## 2013-11-09 DIAGNOSIS — Z859 Personal history of malignant neoplasm, unspecified: Secondary | ICD-10-CM | POA: Insufficient documentation

## 2013-11-09 DIAGNOSIS — J45901 Unspecified asthma with (acute) exacerbation: Secondary | ICD-10-CM | POA: Diagnosis not present

## 2013-11-09 DIAGNOSIS — Z8619 Personal history of other infectious and parasitic diseases: Secondary | ICD-10-CM | POA: Diagnosis not present

## 2013-11-09 DIAGNOSIS — R1012 Left upper quadrant pain: Secondary | ICD-10-CM | POA: Insufficient documentation

## 2013-11-09 DIAGNOSIS — E669 Obesity, unspecified: Secondary | ICD-10-CM | POA: Diagnosis not present

## 2013-11-09 DIAGNOSIS — Z8719 Personal history of other diseases of the digestive system: Secondary | ICD-10-CM | POA: Diagnosis not present

## 2013-11-09 DIAGNOSIS — Z8673 Personal history of transient ischemic attack (TIA), and cerebral infarction without residual deficits: Secondary | ICD-10-CM | POA: Diagnosis not present

## 2013-11-09 DIAGNOSIS — G608 Other hereditary and idiopathic neuropathies: Secondary | ICD-10-CM | POA: Insufficient documentation

## 2013-11-09 DIAGNOSIS — Z87891 Personal history of nicotine dependence: Secondary | ICD-10-CM | POA: Insufficient documentation

## 2013-11-09 DIAGNOSIS — G43909 Migraine, unspecified, not intractable, without status migrainosus: Secondary | ICD-10-CM | POA: Diagnosis not present

## 2013-11-09 DIAGNOSIS — I10 Essential (primary) hypertension: Secondary | ICD-10-CM | POA: Insufficient documentation

## 2013-11-09 DIAGNOSIS — I2510 Atherosclerotic heart disease of native coronary artery without angina pectoris: Secondary | ICD-10-CM | POA: Insufficient documentation

## 2013-11-09 DIAGNOSIS — E1165 Type 2 diabetes mellitus with hyperglycemia: Secondary | ICD-10-CM

## 2013-11-09 DIAGNOSIS — Z79899 Other long term (current) drug therapy: Secondary | ICD-10-CM | POA: Insufficient documentation

## 2013-11-09 DIAGNOSIS — Z87448 Personal history of other diseases of urinary system: Secondary | ICD-10-CM | POA: Insufficient documentation

## 2013-11-09 DIAGNOSIS — I252 Old myocardial infarction: Secondary | ICD-10-CM | POA: Diagnosis not present

## 2013-11-09 DIAGNOSIS — R109 Unspecified abdominal pain: Secondary | ICD-10-CM | POA: Diagnosis present

## 2013-11-09 LAB — CBC
HCT: 29.8 % — ABNORMAL LOW (ref 36.0–46.0)
Hemoglobin: 9.7 g/dL — ABNORMAL LOW (ref 12.0–15.0)
MCH: 20.3 pg — ABNORMAL LOW (ref 26.0–34.0)
MCHC: 32.6 g/dL (ref 30.0–36.0)
MCV: 62.5 fL — AB (ref 78.0–100.0)
PLATELETS: 546 10*3/uL — AB (ref 150–400)
RBC: 4.77 MIL/uL (ref 3.87–5.11)
RDW: 18.7 % — AB (ref 11.5–15.5)
WBC: 18.6 10*3/uL — ABNORMAL HIGH (ref 4.0–10.5)

## 2013-11-09 LAB — BASIC METABOLIC PANEL
ANION GAP: 17 — AB (ref 5–15)
BUN: 14 mg/dL (ref 6–23)
CALCIUM: 9.8 mg/dL (ref 8.4–10.5)
CO2: 22 mEq/L (ref 19–32)
Chloride: 98 mEq/L (ref 96–112)
Creatinine, Ser: 0.98 mg/dL (ref 0.50–1.10)
GFR calc non Af Amer: 71 mL/min — ABNORMAL LOW (ref >90)
GFR, EST AFRICAN AMERICAN: 83 mL/min — AB (ref >90)
Glucose, Bld: 199 mg/dL — ABNORMAL HIGH (ref 70–99)
Potassium: 4.3 mEq/L (ref 3.7–5.3)
SODIUM: 137 meq/L (ref 137–147)

## 2013-11-09 LAB — I-STAT TROPONIN, ED: Troponin i, poc: 0 ng/mL (ref 0.00–0.08)

## 2013-11-09 LAB — PRO B NATRIURETIC PEPTIDE: PRO B NATRI PEPTIDE: 545.5 pg/mL — AB (ref 0–125)

## 2013-11-09 MED ORDER — SODIUM CHLORIDE 0.9 % IV BOLUS (SEPSIS)
1000.0000 mL | Freq: Once | INTRAVENOUS | Status: AC
  Administered 2013-11-09: 1000 mL via INTRAVENOUS

## 2013-11-09 MED ORDER — LORAZEPAM 2 MG/ML IJ SOLN
1.0000 mg | Freq: Once | INTRAMUSCULAR | Status: AC
  Administered 2013-11-09: 1 mg via INTRAVENOUS
  Filled 2013-11-09: qty 1

## 2013-11-09 MED ORDER — FENTANYL CITRATE 0.05 MG/ML IJ SOLN
100.0000 ug | Freq: Once | INTRAMUSCULAR | Status: AC
  Administered 2013-11-09: 100 ug via INTRAVENOUS
  Filled 2013-11-09: qty 2

## 2013-11-09 MED ORDER — GI COCKTAIL ~~LOC~~
30.0000 mL | Freq: Once | ORAL | Status: AC
  Administered 2013-11-09: 30 mL via ORAL
  Filled 2013-11-09: qty 30

## 2013-11-09 MED ORDER — ONDANSETRON HCL 4 MG/2ML IJ SOLN
4.0000 mg | Freq: Once | INTRAMUSCULAR | Status: AC
  Administered 2013-11-09: 4 mg via INTRAVENOUS
  Filled 2013-11-09: qty 2

## 2013-11-09 NOTE — ED Notes (Signed)
Patient transported to X-ray 

## 2013-11-09 NOTE — ED Notes (Signed)
Pt states she started having midsternal CP/SOB and N/V 3 days ago. Hx of same. Alert and oriented.

## 2013-11-10 LAB — I-STAT TROPONIN, ED: Troponin i, poc: 0.01 ng/mL (ref 0.00–0.08)

## 2013-11-10 MED ORDER — HYDROCODONE-ACETAMINOPHEN 5-325 MG PO TABS: 1.0000 | ORAL_TABLET | Freq: Four times a day (QID) | ORAL | Status: DC | PRN

## 2013-11-10 MED ORDER — PROMETHAZINE HCL 25 MG PO TABS: 25.0000 mg | ORAL_TABLET | Freq: Three times a day (TID) | ORAL | Status: DC | PRN

## 2013-11-10 NOTE — ED Notes (Signed)
Asked pt for urine specimen, pt stated to "give her a minute." Will re-attempt at 0100.

## 2013-11-10 NOTE — Discharge Instructions (Signed)
Return here as needed. Follow up with your doctor. Increase your fluid intake. °

## 2013-11-10 NOTE — ED Provider Notes (Signed)
CSN: [id number]     Arrival date & time 11/09/13  2028 History   First MD Initiated Contact with Patient 11/09/13 2030     Chief Complaint  Patient presents with  . Chest Pain  . Shortness of Breath     (Consider location/radiation/quality/duration/timing/severity/associated sxs/prior Treatment) HPI Patient presents to the emergency department with abdominal pain, with nausea and vomiting.  This started 3 days, ago, and 2 days, ago started with chest pain, both of her areas of pain, has been constant.  The patient, states, that she has not followed up with her GI doctor since her last visit to the emergency department.  The patient, states, that this is a chronic history over the last 3 years.  The patient, states, that nothing seems to make her condition, better or worse.  Patient denies diarrhea, hematemesis, bloody stool, weakness, dizziness, headache, blurred vision, dysuria, rash, fever, decreased appetite, increased urination, incontinence, constipation, or syncope.  The patient, states, that she has not taken any other medications besides to prescribe medicines prior to arrival. Past Medical History  Diagnosis Date  . Thrombocytosis     Hem/Onc suggested 2/2 chronic hepatits and/or iron deficiency anemia  . Iron deficiency anemia   . N&V (nausea and vomiting)     Chronic. Unclear etiology with multiple admission and ED visits. CT abdomen with and without contrast (02/2011)  showed no acute process. Gastic Emptying scan (01/2010) was normal. Ultrasound of the abdomen was within normal limits. Hepatitis B viral load was undectable. HIV NR. EGD - gastritis, Hpylori + s/p Rx  . Hypertension   . Hyperlipidemia   . Diabetes mellitus type 2, uncontrolled, with complications   . Recurrent boils   . Back pain   . OSA (obstructive sleep apnea)   . CAD (coronary artery disease) 06/15/2006    s/p Subendocardial MI with PDA angioplasty(no stent) on 06/15/06 and relook  cath 06/19/06 showed patency  of site. Cath 12/10- no restenosis or significant CAD progression  . Irregular menses     Small ovarian follicles seen on NW(2/95)  . History of pyelonephritis     H/o GrpB Pyelonephritis (9/06) and UTI- 07/11- E.Coli, 12/10- GBS  . Abscess of tunica vaginalis     10/09- Abundant S. aureus- sensitive to all abx  . Obesity   . Asthma   . Gastritis   . Depression   . Hepatitis B, chronic     Hep BeAb+,Hep B cAb+ & Hep BsAg+ (9/06)  . Peripheral neuropathy   . Fibromyalgia   . GERD (gastroesophageal reflux disease)   . Migraine   . Anxiety   . Gastroparesis     secondary to poorly controlled DM, last emptying study performed 01/2010  was normal but may be falsely positive as pt was on reglan  . Blood dyscrasia   . H/O: CVA (cerebrovascular accident)     history of remote right cerebellar infarct noted on head CT at least since 10/2011  . Cancer   . MI (myocardial infarction)    Past Surgical History  Procedure Laterality Date  . Cesarean section  1997   Family History  Problem Relation Age of Onset  . Diabetes Father    History  Substance Use Topics  . Smoking status: Former Smoker    Types: Cigarettes    Quit date: 04/24/1996  . Smokeless tobacco: Never Used     Comment: quit smoking cigarettes age 38  . Alcohol Use: Yes     Comment: occasionally  OB History   Grav Para Term Preterm Abortions TAB SAB Ect Mult Living                 Review of Systems  All other systems negative except as documented in the HPI. All pertinent positives and negatives as reviewed in the HPI.  Allergies  Lisinopril and Morphine and related  Home Medications   Prior to Admission medications   Medication Sig Start Date End Date Taking? Authorizing Provider  albuterol (PROVENTIL HFA;VENTOLIN HFA) 108 (90 BASE) MCG/ACT inhaler Inhale 2 puffs into the lungs every 4 (four) hours as needed for wheezing or shortness of breath.   Yes Historical Provider, MD  atorvastatin (LIPITOR) 80 MG  tablet Take 1 tablet (80 mg total) by mouth daily at 6 PM. 07/25/13  Yes Rebecca Eaton, MD  gabapentin (NEURONTIN) 300 MG capsule Take 300 mg by mouth 3 (three) times daily as needed (pain).    Yes Historical Provider, MD  insulin aspart (NOVOLOG) 100 UNIT/ML injection Inject 2-12 Units into the skin 3 (three) times daily as needed for high blood sugar. Sliding scale- If sugar 150-199: inject 2 unit;  200-249: 4 units, 250-299: 7 units; 300-349: 10 units; Over 350: 12 units   Yes Historical Provider, MD  insulin glargine (LANTUS) 100 UNIT/ML injection Inject 50 Units into the skin at bedtime. 05/01/13  Yes Juluis Mire, MD  labetalol (NORMODYNE) 100 MG tablet Take 1 tablet (100 mg total) by mouth 2 (two) times daily. 08/27/13  Yes Neema Bobbie Stack, MD  LORazepam (ATIVAN) 1 MG tablet Take 1 tablet (1 mg total) by mouth 3 (three) times daily as needed for anxiety. 05/20/13  Yes Garald Balding, NP  ondansetron (ZOFRAN) 4 MG tablet Take 1 tablet (4 mg total) by mouth every 6 (six) hours. 10/15/13  Yes Courtney A Forcucci, PA-C  pregabalin (LYRICA) 75 MG capsule Take 1 capsule (75 mg total) by mouth 2 (two) times daily. 11/04/12  Yes Valaria Good, MD   BP 129/94  Pulse 124  Temp(Src) 97.9 F (36.6 C) (Oral)  Resp 30  Ht 5\' 7"  (1.702 m)  Wt 190 lb (86.183 kg)  BMI 29.75 kg/m2  SpO2 100%  LMP 10/16/2013 Physical Exam  Nursing note and vitals reviewed. Constitutional: She is oriented to person, place, and time. She appears well-developed and well-nourished.  HENT:  Head: Normocephalic and atraumatic.  Mouth/Throat: Oropharynx is clear and moist.  Eyes: Conjunctivae are normal. Pupils are equal, round, and reactive to light.  Neck: Normal range of motion. Neck supple.  Cardiovascular: Normal rate, regular rhythm and normal heart sounds.  Exam reveals no gallop and no friction rub.   No murmur heard. Pulmonary/Chest: Effort normal and breath sounds normal. No respiratory distress.  Abdominal: Soft.  Bowel sounds are normal. She exhibits no distension. There is tenderness. There is no rebound and no guarding.  Neurological: She is alert and oriented to person, place, and time. She exhibits normal muscle tone. Coordination normal.  Skin: Skin is warm and dry. No rash noted. No erythema. No pallor.    ED Course  Procedures (including critical care time) Labs Review Labs Reviewed  CBC - Abnormal; Notable for the following:    WBC 18.6 (*)    Hemoglobin 9.7 (*)    HCT 29.8 (*)    MCV 62.5 (*)    MCH 20.3 (*)    RDW 18.7 (*)    Platelets 546 (*)    All other components within normal  limits  BASIC METABOLIC PANEL - Abnormal; Notable for the following:    Glucose, Bld 199 (*)    GFR calc non Af Amer 71 (*)    GFR calc Af Amer 83 (*)    Anion gap 17 (*)    All other components within normal limits  PRO B NATRIURETIC PEPTIDE - Abnormal; Notable for the following:    Pro B Natriuretic peptide (BNP) 545.5 (*)    All other components within normal limits  I-STAT TROPOININ, ED  Randolm Idol, ED  POC URINE PREG, ED    Imaging Review Dg Chest Port 1 View  11/09/2013   CLINICAL DATA:  Chest pain.  EXAM: PORTABLE CHEST - 1 VIEW  COMPARISON:  09/29/2013 and 08/09/2013.  FINDINGS: 2057 hr. There are lower lung volumes with mild patient rotation to the right. Allowing for this, the heart size and mediastinal contours are stable. The lungs are clear. There is no pleural effusion or pneumothorax. Numerous telemetry leads overlie the chest. No acute osseous findings are evident.  IMPRESSION: No active cardiopulmonary process.   Electronically Signed   By: Camie Patience M.D.   On: 11/09/2013 21:07   Dg Abd 2 Views  11/09/2013   CLINICAL DATA:  Abdominal pain with nausea and vomiting for 2 years. History of diabetes and gastritis.  EXAM: ABDOMEN - 2 VIEW  COMPARISON:  07/31/2003 acute abdominal series. Abdominal pelvic CT 04/11/2013.  FINDINGS: The bowel gas pattern is normal. There is no free  intraperitoneal air. Pelvic calcifications are stable. The osseous structures appear unremarkable.  IMPRESSION: No acute abdominal pelvic findings.   Electronically Signed   By: Camie Patience M.D.   On: 11/09/2013 21:33     Date: 11/10/2013  Rate: 124  Rhythm: sinus tachycardia  QRS Axis: normal  Intervals: normal  ST/T Wave abnormalities: nonspecific ST changes  Conduction Disutrbances:none  Narrative Interpretation:   Old EKG Reviewed: unchanged    The patient has exacerbation of her chronic symptoms.  [name] chest pain, has been constant for almost 3 days.  The patient, states, that these are similar symptoms to, what she's had previously.  Patient is feeling some better following the medications given here in the emergency department.  Patient advised followup with her primary care Dr. told to return here as needed  Brent General, PA-C 11/10/13 [place], PA-C 11/10/13 0111

## 2013-11-11 ENCOUNTER — Other Ambulatory Visit: Payer: Self-pay | Admitting: *Deleted

## 2013-11-11 MED ORDER — INSULIN ASPART 100 UNIT/ML ~~LOC~~ SOLN: SUBCUTANEOUS | Status: DC

## 2013-11-11 MED ORDER — INSULIN GLARGINE 100 UNIT/ML SOLOSTAR PEN: 40.0000 [IU] | PEN_INJECTOR | Freq: Every day | SUBCUTANEOUS | Status: DC

## 2013-11-11 MED ORDER — INSULIN GLARGINE 100 UNIT/ML SOLOSTAR PEN: 50.0000 [IU] | PEN_INJECTOR | Freq: Every day | SUBCUTANEOUS | Status: DC

## 2013-11-11 NOTE — Telephone Encounter (Signed)
Pt would like novolog flexpen Last visit 08/2013, diabetes not addressed at that visit, here for a problem Will attempt to schedule for a visit

## 2013-11-13 NOTE — ED Provider Notes (Signed)
Medical screening examination/treatment/procedure(s) were performed by non-physician practitioner and as supervising physician I was immediately available for consultation/collaboration.   EKG Interpretation   Date/Time:  Sunday November 09 2013 20:35:29 EDT Ventricular Rate:  124 PR Interval:  95 QRS Duration: 107 QT Interval:  360 QTC Calculation: 517 R Axis:   38 Text Interpretation:  Sinus tachycardia Inferior infarct, age  indeterminate Anterior infarct, old Prolonged QT interval ED PHYSICIAN  INTERPRETATION AVAILABLE IN [place] Confirmed by TEST, Record  ([id number]) on 11/11/2013 7:30:59 AM       Virgel Manifold, MD 11/13/13 1309

## 2013-11-24 ENCOUNTER — Encounter (HOSPITAL_COMMUNITY): Payer: Self-pay | Admitting: Emergency Medicine

## 2013-11-24 ENCOUNTER — Emergency Department (HOSPITAL_COMMUNITY)
Admission: EM | Admit: 2013-11-24 | Discharge: 2013-11-25 | Disposition: A | Discharge status: Home / Self Care | Payer: Medicaid Other | Attending: Emergency Medicine | Admitting: Emergency Medicine

## 2013-11-24 DIAGNOSIS — G8929 Other chronic pain: Secondary | ICD-10-CM | POA: Diagnosis not present

## 2013-11-24 DIAGNOSIS — K219 Gastro-esophageal reflux disease without esophagitis: Secondary | ICD-10-CM | POA: Diagnosis not present

## 2013-11-24 DIAGNOSIS — G43909 Migraine, unspecified, not intractable, without status migrainosus: Secondary | ICD-10-CM | POA: Diagnosis not present

## 2013-11-24 DIAGNOSIS — F411 Generalized anxiety disorder: Secondary | ICD-10-CM | POA: Insufficient documentation

## 2013-11-24 DIAGNOSIS — R0789 Other chest pain: Secondary | ICD-10-CM

## 2013-11-24 DIAGNOSIS — Z8742 Personal history of other diseases of the female genital tract: Secondary | ICD-10-CM | POA: Insufficient documentation

## 2013-11-24 DIAGNOSIS — J45901 Unspecified asthma with (acute) exacerbation: Secondary | ICD-10-CM | POA: Insufficient documentation

## 2013-11-24 DIAGNOSIS — R112 Nausea with vomiting, unspecified: Secondary | ICD-10-CM | POA: Insufficient documentation

## 2013-11-24 DIAGNOSIS — I10 Essential (primary) hypertension: Secondary | ICD-10-CM | POA: Insufficient documentation

## 2013-11-24 DIAGNOSIS — Z862 Personal history of diseases of the blood and blood-forming organs and certain disorders involving the immune mechanism: Secondary | ICD-10-CM | POA: Insufficient documentation

## 2013-11-24 DIAGNOSIS — R6883 Chills (without fever): Secondary | ICD-10-CM | POA: Insufficient documentation

## 2013-11-24 DIAGNOSIS — Z87891 Personal history of nicotine dependence: Secondary | ICD-10-CM | POA: Diagnosis not present

## 2013-11-24 DIAGNOSIS — Z8619 Personal history of other infectious and parasitic diseases: Secondary | ICD-10-CM | POA: Insufficient documentation

## 2013-11-24 DIAGNOSIS — Z87440 Personal history of urinary (tract) infections: Secondary | ICD-10-CM | POA: Insufficient documentation

## 2013-11-24 DIAGNOSIS — E785 Hyperlipidemia, unspecified: Secondary | ICD-10-CM | POA: Diagnosis not present

## 2013-11-24 DIAGNOSIS — N390 Urinary tract infection, site not specified: Secondary | ICD-10-CM | POA: Diagnosis not present

## 2013-11-24 DIAGNOSIS — I2510 Atherosclerotic heart disease of native coronary artery without angina pectoris: Secondary | ICD-10-CM | POA: Diagnosis not present

## 2013-11-24 DIAGNOSIS — R109 Unspecified abdominal pain: Secondary | ICD-10-CM | POA: Diagnosis not present

## 2013-11-24 DIAGNOSIS — Z794 Long term (current) use of insulin: Secondary | ICD-10-CM | POA: Diagnosis not present

## 2013-11-24 DIAGNOSIS — Z79899 Other long term (current) drug therapy: Secondary | ICD-10-CM | POA: Insufficient documentation

## 2013-11-24 DIAGNOSIS — F3289 Other specified depressive episodes: Secondary | ICD-10-CM | POA: Insufficient documentation

## 2013-11-24 DIAGNOSIS — IMO0001 Reserved for inherently not codable concepts without codable children: Secondary | ICD-10-CM | POA: Diagnosis not present

## 2013-11-24 DIAGNOSIS — E119 Type 2 diabetes mellitus without complications: Secondary | ICD-10-CM | POA: Diagnosis not present

## 2013-11-24 DIAGNOSIS — I252 Old myocardial infarction: Secondary | ICD-10-CM | POA: Insufficient documentation

## 2013-11-24 DIAGNOSIS — R079 Chest pain, unspecified: Secondary | ICD-10-CM | POA: Diagnosis present

## 2013-11-24 DIAGNOSIS — Z8669 Personal history of other diseases of the nervous system and sense organs: Secondary | ICD-10-CM | POA: Insufficient documentation

## 2013-11-24 DIAGNOSIS — F329 Major depressive disorder, single episode, unspecified: Secondary | ICD-10-CM | POA: Diagnosis not present

## 2013-11-24 HISTORY — DX: Other psychoactive substance abuse, uncomplicated: F19.10

## 2013-11-24 MED ORDER — SODIUM CHLORIDE 0.9 % IV SOLN
1000.0000 mL | Freq: Once | INTRAVENOUS | Status: AC
  Administered 2013-11-25: 1000 mL via INTRAVENOUS

## 2013-11-24 MED ORDER — SODIUM CHLORIDE 0.9 % IV SOLN
1000.0000 mL | INTRAVENOUS | Status: DC
  Administered 2013-11-25: 1000 mL via INTRAVENOUS

## 2013-11-24 MED ORDER — ONDANSETRON HCL 4 MG/2ML IJ SOLN
4.0000 mg | Freq: Once | INTRAMUSCULAR | Status: AC
  Administered 2013-11-25: 4 mg via INTRAVENOUS
  Filled 2013-11-24: qty 2

## 2013-11-24 NOTE — ED Notes (Signed)
Pt states she started vomiting early Saturday morning and has been since  Pt states it is yellow with some blood mixed in  Pt states on Sunday she started having chest pain  Pt states she feels dehydrated and weak all over  Pt states she feels like her heart is racing  )

## 2013-11-24 NOTE — ED Provider Notes (Signed)
CSN: [id number]     Arrival date & time 11/24/13  2255 History   First MD Initiated Contact with Patient 11/24/13 2331     Chief Complaint  Patient presents with  . Emesis  . Chest Pain     (Consider location/radiation/quality/duration/timing/severity/associated sxs/prior Treatment) The history is provided by the patient. No language interpreter was used.  Shirley [name] is a 38 year old female with past medical history of hypertension, iron deficiency anemia, hyperlipidemia, type 2 diabetes, coronary artery disease, hepatitis B, gastroparesis presenting to the ED with nausea, vomiting, abdominal pain, and chest pain. Patient reported that the nausea and vomiting started on Friday-reported that she's been having more than 5 episodes of emesis per day described as a yellow consistency with mild bright red blood specks that she noticed today. Patient reported that she's been experiencing left lower quadrant pain described as a major cramping sensation - stated this is a chronic issue that she's been having for the past 2 years. Stated that she has been having intermittent chills. Reported that she started to develop chest pain on "Sunday described as a sharp pain localized to the Center of her chest that is constant, reported associated symptoms of shortness of breath. Stated that she has not eaten anything since Friday. Reported that she was being seen by Dr. Hung, but reported that care was discontinued secondary to continuous rescheduling. Patient has not had an EGD in the past 2 years. Patient reports she's history of H. pylori. Stated that she was due to get an EGD scheduled by her primary care provider, Dr. Schooler. Patient reports that she has a history of cocaine, but denies recent use. Stated that since not being able to keep any food or fluids down patient is been feeling rather weak. Denied difficulty breathing, travels, leg swelling, diarrhea, melena, hematochezia, urinary symptoms, hematuria,  fever, vaginal complaints. Denied marijuana, cocaine, heroin, ethanol. Denied chronic NSAID use.  PCP Dr. Ngo  Past Medical History  Diagnosis Date  . Thrombocytosis     Hem/Onc suggested 2/2 chronic hepatits and/or iron deficiency anemia  . Iron deficiency anemia   . N&V (nausea and vomiting)     Chronic. Unclear etiology with multiple admission and ED visits. CT abdomen with and without contrast (02/2011)  showed no acute process. Gastic Emptying scan (01/2010) was normal. Ultrasound of the abdomen was within normal limits. Hepatitis B viral load was undectable. HIV NR. EGD - gastritis, Hpylori + s/p Rx  . Hypertension   . Hyperlipidemia   . Diabetes mellitus type 2, uncontrolled, with complications   . Recurrent boils   . Back pain   . OSA (obstructive sleep apnea)   . 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 Cath 12/10- no restenosis or significant CAD progression  . Irregular menses     Small ovarian follicles seen on CT(9/06)  . History of pyelonephritis     H/o GrpB Pyelonephritis (9/06) and UTI- 07/11- E.Coli, 12/10- GBS  . Abscess of tunica vaginalis     10" /09- Abundant S. aureus- sensitive to all abx  . Obesity   . Asthma   . Gastritis   . Depression   . Hepatitis B, chronic     Hep BeAb+,Hep B cAb+ & Hep BsAg+ (9/06)  . Peripheral neuropathy   . Fibromyalgia   . GERD (gastroesophageal reflux disease)   . Migraine   . Anxiety   .  Gastroparesis     secondary to poorly controlled DM, last emptying study performed 01/2010  was normal but may be falsely positive as pt was on reglan  . Blood dyscrasia   . H/O: CVA (cerebrovascular accident)     history of remote right cerebellar infarct noted on head CT at least since 10/2011  . Cancer   . MI (myocardial infarction)    Past Surgical History  Procedure Laterality Date  . Cesarean section  1997   Family  History  Problem Relation Age of Onset  . Diabetes Father    History  Substance Use Topics  . Smoking status: Former Smoker    Types: Cigarettes    Quit date: 04/24/1996  . Smokeless tobacco: Never Used     Comment: quit smoking cigarettes age 26  . Alcohol Use: Yes     Comment: occasionally   OB History   Grav Para Term Preterm Abortions TAB SAB Ect Mult Living                 Review of Systems  Constitutional: Positive for chills. Negative for fever and diaphoresis.  Respiratory: Positive for shortness of breath. Negative for cough and chest tightness.   Cardiovascular: Positive for chest pain.  Gastrointestinal: Positive for nausea, vomiting and abdominal pain. Negative for diarrhea, constipation, blood in stool and anal bleeding.  Musculoskeletal: Negative for back pain and neck pain.  Neurological: Negative for dizziness, weakness and headaches.      Allergies  Lisinopril and Morphine and related  Home Medications   Prior to Admission medications   Medication Sig Start Date End Date Taking? Authorizing Provider  albuterol (PROVENTIL HFA;VENTOLIN HFA) 108 (90 BASE) MCG/ACT inhaler Inhale 2 puffs into the lungs every 4 (four) hours as needed for wheezing or shortness of breath.   Yes Historical Provider, MD  atorvastatin (LIPITOR) 80 MG tablet Take 1 tablet (80 mg total) by mouth daily at 6 PM. 07/25/13  Yes Rebecca Eaton, MD  gabapentin (NEURONTIN) 300 MG capsule Take 300 mg by mouth 3 (three) times daily as needed (pain).    Yes Historical Provider, MD  HYDROcodone-acetaminophen (NORCO/VICODIN) 5-325 MG per tablet Take 1 tablet by mouth every 6 (six) hours as needed for moderate pain. 11/10/13  Yes Resa Miner Lawyer, PA-C  insulin aspart (NOVOLOG) 100 UNIT/ML injection Sliding scale- If sugar 150-199: inject 2 unit;  200-249: 4 units, 250-299: 7 units; 300-349: 10 units; Over 350: 12 units 11/11/13  Yes Bartholomew Crews, MD  Insulin Glargine (LANTUS SOLOSTAR) 100  UNIT/ML Solostar Pen Inject 50 Units into the skin daily at 10 pm. 11/11/13  Yes Bartholomew Crews, MD  labetalol (NORMODYNE) 100 MG tablet Take 1 tablet (100 mg total) by mouth 2 (two) times daily. 08/27/13  Yes Neema Bobbie Stack, MD  ondansetron (ZOFRAN) 4 MG tablet Take 1 tablet (4 mg total) by mouth every 6 (six) hours. 10/15/13  Yes Courtney A Forcucci, PA-C  cephALEXin (KEFLEX) 500 MG capsule Take 1 capsule (500 mg total) by mouth 2 (two) times daily. 11/25/13   Natisha Trzcinski, PA-C  ondansetron (ZOFRAN) 4 MG tablet Take 1 tablet (4 mg total) by mouth every 6 (six) hours. 11/25/13   Darlen Gledhill, PA-C  pregabalin (LYRICA) 75 MG capsule Take 1 capsule (75 mg total) by mouth 2 (two) times daily. 11/04/12   Valaria Good, MD   BP 164/101  Pulse 94  Temp(Src) 98.3 F (36.8 C) (Oral)  Resp 20  Ht 5\' 7"  (  1.702 m)  Wt 196 lb (88.905 kg)  BMI 30.69 kg/m2  SpO2 100%  LMP 11/15/2013 Physical Exam  Nursing note and vitals reviewed. Constitutional: She is oriented to person, place, and time. She appears well-developed and well-nourished. No distress.  HENT:  Head: Normocephalic and atraumatic.  Mouth/Throat: Oropharynx is clear and moist. No oropharyngeal exudate.  Eyes: Conjunctivae and EOM are normal. Pupils are equal, round, and reactive to light. Right eye exhibits no discharge. Left eye exhibits no discharge.  Neck: Normal range of motion. Neck supple. No tracheal deviation present.  Cardiovascular: Normal rate, regular rhythm and normal heart sounds.   Cap refill < 3 seconds Negative swelling or pitting edema identified to the lower extremities bilaterally. Negative Homan's sign.   Pulmonary/Chest: Effort normal and breath sounds normal. No respiratory distress. She has no wheezes. She has no rales.  Patient is able to speak in full sentences without difficulty Negative use of accessory muscles Negative stridor  Abdominal: Soft. Bowel sounds are normal. She exhibits no distension. There  is tenderness. There is no rebound and no guarding.  Obese  Negative abdominal distension noted Abdomen soft upon palpation  Discomfort upon palpation to the LLQ and suprapubic region  Negative guarding or rigidity noted upon palpation  Negative peritoneal signs Negative pain out of proportion to exam   Musculoskeletal: Normal range of motion.  Full ROM to upper and lower extremities without difficulty noted, negative ataxia noted.  Lymphadenopathy:    She has no cervical adenopathy.  Neurological: She is alert and oriented to person, place, and time. No cranial nerve deficit. She exhibits normal muscle tone. Coordination normal.  Cranial nerves III-XII grossly intact Strength 5+/5+ to upper and lower extremities bilaterally with resistance applied, equal distribution noted Equal grip strength bilaterally  Negative facial drooping Negative slurred speech  Negative aphasia Patient is able to follow commands well Patient is able to answer questions adequately  GCS 15  Skin: Skin is warm and dry. No rash noted. She is not diaphoretic. No erythema.  Psychiatric: She has a normal mood and affect. Her behavior is normal. Thought content normal.    ED Course  Procedures (including critical care time)  Results for orders placed during the hospital encounter of 11/24/13  CBC WITH DIFFERENTIAL      Result Value Ref Range   WBC 18.4 (*) 4.0 - 10.5 K/uL   RBC 5.05  3.87 - 5.11 MIL/uL   Hemoglobin 10.2 (*) 12.0 - 15.0 g/dL   HCT 32.4 (*) 36.0 - 46.0 %   MCV 64.2 (*) 78.0 - 100.0 fL   MCH 20.2 (*) 26.0 - 34.0 pg   MCHC 31.5  30.0 - 36.0 g/dL   RDW 19.0 (*) 11.5 - 15.5 %   Platelets 710 (*) 150 - 400 K/uL   Neutrophils Relative % 69  43 - 77 %   Lymphocytes Relative 22  12 - 46 %   Monocytes Relative 7  3 - 12 %   Eosinophils Relative 2  0 - 5 %   Basophils Relative 0  0 - 1 %   Neutro Abs 12.7 (*) 1.7 - 7.7 K/uL   Lymphs Abs 4.0  0.7 - 4.0 K/uL   Monocytes Absolute 1.3 (*) 0.1 -  1.0 K/uL   Eosinophils Absolute 0.4  0.0 - 0.7 K/uL   Basophils Absolute 0.0  0.0 - 0.1 K/uL   RBC Morphology POLYCHROMASIA PRESENT     WBC Morphology VACUOLATED NEUTROPHILS  Smear Review LARGE PLATELETS PRESENT    COMPREHENSIVE METABOLIC PANEL      Result Value Ref Range   Sodium 132 (*) 137 - 147 mEq/L   Potassium 3.9  3.7 - 5.3 mEq/L   Chloride 93 (*) 96 - 112 mEq/L   CO2 22  19 - 32 mEq/L   Glucose, Bld 228 (*) 70 - 99 mg/dL   BUN 19  6 - 23 mg/dL   Creatinine, Ser 1.03  0.50 - 1.10 mg/dL   Calcium 10.4  8.4 - 10.5 mg/dL   Total Protein 9.4 (*) 6.0 - 8.3 g/dL   Albumin 3.6  3.5 - 5.2 g/dL   AST 14  0 - 37 U/L   ALT 6  0 - 35 U/L   Alkaline Phosphatase 71  39 - 117 U/L   Total Bilirubin 0.5  0.3 - 1.2 mg/dL   GFR calc non Af Amer 67 (*) >90 mL/min   GFR calc Af Amer 78 (*) >90 mL/min   Anion gap 17 (*) 5 - 15  LIPASE, BLOOD      Result Value Ref Range   Lipase 32  11 - 59 U/L  URINALYSIS, ROUTINE W REFLEX MICROSCOPIC      Result Value Ref Range   Color, Urine AMBER (*) YELLOW   APPearance TURBID (*) CLEAR   Specific Gravity, Urine 1.034 (*) 1.005 - 1.030   pH 5.0  5.0 - 8.0   Glucose, UA 250 (*) NEGATIVE mg/dL   Hgb urine dipstick NEGATIVE  NEGATIVE   Bilirubin Urine MODERATE (*) NEGATIVE   Ketones, ur NEGATIVE  NEGATIVE mg/dL   Protein, ur 100 (*) NEGATIVE mg/dL   Urobilinogen, UA 1.0  0.0 - 1.0 mg/dL   Nitrite NEGATIVE  NEGATIVE   Leukocytes, UA SMALL (*) NEGATIVE  URINE RAPID DRUG SCREEN (HOSP PERFORMED)      Result Value Ref Range   Opiates NONE DETECTED  NONE DETECTED   Cocaine POSITIVE (*) NONE DETECTED   Benzodiazepines NONE DETECTED  NONE DETECTED   Amphetamines NONE DETECTED  NONE DETECTED   Tetrahydrocannabinol POSITIVE (*) NONE DETECTED   Barbiturates POSITIVE (*) NONE DETECTED  TROPONIN I      Result Value Ref Range   Troponin I <0.30  <0.30 ng/mL  HCG, SERUM, QUALITATIVE      Result Value Ref Range   Preg, Serum NEGATIVE  NEGATIVE  URINE  MICROSCOPIC-ADD ON      Result Value Ref Range   Squamous Epithelial / LPF MANY (*) RARE   WBC, UA 7-10  <3 WBC/hpf   Bacteria, UA MANY (*) RARE  CBG MONITORING, ED      Result Value Ref Range   Glucose-Capillary 211 (*) 70 - 99 mg/dL    Labs Review Labs Reviewed  CBC WITH DIFFERENTIAL - Abnormal; Notable for the following:    WBC 18.4 (*)    Hemoglobin 10.2 (*)    HCT 32.4 (*)    MCV 64.2 (*)    MCH 20.2 (*)    RDW 19.0 (*)    Platelets 710 (*)    Neutro Abs 12.7 (*)    Monocytes Absolute 1.3 (*)    All other components within normal limits  COMPREHENSIVE METABOLIC PANEL - Abnormal; Notable for the following:    Sodium 132 (*)    Chloride 93 (*)    Glucose, Bld 228 (*)    Total Protein 9.4 (*)    GFR calc non Af Amer 67 (*)  GFR calc Af Amer 78 (*)    Anion gap 17 (*)    All other components within normal limits  URINALYSIS, ROUTINE W REFLEX MICROSCOPIC - Abnormal; Notable for the following:    Color, Urine AMBER (*)    APPearance TURBID (*)    Specific Gravity, Urine 1.034 (*)    Glucose, UA 250 (*)    Bilirubin Urine MODERATE (*)    Protein, ur 100 (*)    Leukocytes, UA SMALL (*)    All other components within normal limits  URINE RAPID DRUG SCREEN (HOSP PERFORMED) - Abnormal; Notable for the following:    Cocaine POSITIVE (*)    Tetrahydrocannabinol POSITIVE (*)    Barbiturates POSITIVE (*)    All other components within normal limits  URINE MICROSCOPIC-ADD ON - Abnormal; Notable for the following:    Squamous Epithelial / LPF MANY (*)    Bacteria, UA MANY (*)    All other components within normal limits  CBG MONITORING, ED - Abnormal; Notable for the following:    Glucose-Capillary 211 (*)    All other components within normal limits  URINE CULTURE  LIPASE, BLOOD  TROPONIN I  HCG, SERUM, QUALITATIVE  CBG MONITORING, ED    Imaging Review No results found.   EKG Interpretation   Date/Time:  Monday November 24 2013 23:06:39 EDT Ventricular Rate:   143 PR Interval:  81 QRS Duration: 119 QT Interval:  352 QTC Calculation: 543 R Axis:   -2 Text Interpretation:  Sinus or ectopic atrial tachycardia Nonspecific  intraventricular conduction delay Inferior infarct, age indeterminate  Anterior infarct, old No significant change was found Confirmed by CAMPOS   MD, Lennette Bihari ([id number]) on 11/24/2013 11:17:36 PM      MDM   Final diagnoses:  Urinary tract infection without hematuria, site unspecified  Chronic abdominal pain  Nausea and vomiting in adult    Medications  ondansetron (ZOFRAN) injection 4 mg (not administered)  0.9 %  sodium chloride infusion (1,000 mLs Intravenous New Bag/Given 11/25/13 0003)    Followed by  0.9 %  sodium chloride infusion (not administered)  cefTRIAXone (ROCEPHIN) 1 g in dextrose 5 % 50 mL IVPB (not administered)   Filed Vitals:   11/24/13 2307 11/25/13 0148  BP: 137/86 164/101  Pulse:  94  Temp: 98.7 F (37.1 C) 98.3 F (36.8 C)  TempSrc: Oral   Resp: 19 20  Height: 5\' 7"  (1.702 m)   Weight: 196 lb (88.905 kg)   SpO2: 100%    EKG noted sinus tachycardia with heart rate 143 beats per minute-no change since last tracing. Troponin negative elevation. CBC noted elevated white blood cell count of 18.4 with negative left shift. When compared to previous labs-patient appears to have chronic leukocytosis. Hemoglobin 10.2, hematocrit 32.4. CMP BUN 19, creatinine 1.03. AST, ALT, alkaline phosphatase and bilirubin negative elevation. Glucose 228 with an anion gap of 17.0 mEq per liter. Lipase negative elevation. Urine drug screen positive for cocaine, cannabis, and barbs. Negative pregnancy. Urinalysis noted moderate bilirubin, elevated specific gravity 1.034, small leukocytes with white blood cell count 7-10, and many bacteria identified. Urine culture pending. Patient started on IV fluids. IV antibiotics administered for urinary tract infection. Patient reported that she's been having intermittent nausea, vomiting,  abdominal pain for the past 2 years - this appears to be a chronic issue. Patient has been assessed in the ED setting numerous times regarding abdominal pain, nausea, and vomiting.  Doubt DKA. Doubt acute abdominal processes - benign abdominal  exam. Patient appears to have leukocytosis when compared to previous labs performed, chronic finding. EKG unchanged - continues to use cocaine. Patient was admitted to the hospital in 07/2013 for cardiac rule out with negative findings - troponins negative and patient discharged. Suspicion high for chest pain to be atypical and secondary to cocaine, as well as from continuous emesis. Patient is afebrile, but appears dehydrated. Discussed case with attending physician Dr. Marylene Buerger who agrees to labs and imaging.  Imaging has been ordered and results pending. IV antibiotics and fluids to be administered. Discussed case with Antonietta Breach, PA-C. Transfer of care to [facility], PA-C at change in shift.   Jamse Mead, PA-C 11/25/13 0257  Jef Futch, PA-C 11/25/13 0305  Jamse Mead, PA-C 11/25/13 0320  Jamse Mead, PA-C 11/25/13 1646

## 2013-11-25 ENCOUNTER — Encounter (HOSPITAL_COMMUNITY): Payer: Self-pay | Admitting: Emergency Medicine

## 2013-11-25 ENCOUNTER — Emergency Department (HOSPITAL_COMMUNITY): Payer: Medicaid Other

## 2013-11-25 LAB — URINE MICROSCOPIC-ADD ON

## 2013-11-25 LAB — COMPREHENSIVE METABOLIC PANEL
ALT: 6 U/L (ref 0–35)
AST: 14 U/L (ref 0–37)
Albumin: 3.6 g/dL (ref 3.5–5.2)
Alkaline Phosphatase: 71 U/L (ref 39–117)
Anion gap: 17 — ABNORMAL HIGH (ref 5–15)
BUN: 19 mg/dL (ref 6–23)
CALCIUM: 10.4 mg/dL (ref 8.4–10.5)
CO2: 22 mEq/L (ref 19–32)
Chloride: 93 mEq/L — ABNORMAL LOW (ref 96–112)
Creatinine, Ser: 1.03 mg/dL (ref 0.50–1.10)
GFR calc Af Amer: 78 mL/min — ABNORMAL LOW (ref >90)
GFR, EST NON AFRICAN AMERICAN: 67 mL/min — AB (ref >90)
Glucose, Bld: 228 mg/dL — ABNORMAL HIGH (ref 70–99)
Potassium: 3.9 mEq/L (ref 3.7–5.3)
Sodium: 132 mEq/L — ABNORMAL LOW (ref 137–147)
Total Bilirubin: 0.5 mg/dL (ref 0.3–1.2)
Total Protein: 9.4 g/dL — ABNORMAL HIGH (ref 6.0–8.3)

## 2013-11-25 LAB — CBC WITH DIFFERENTIAL/PLATELET
BASOS ABS: 0 10*3/uL (ref 0.0–0.1)
BASOS PCT: 0 % (ref 0–1)
Eosinophils Absolute: 0.4 10*3/uL (ref 0.0–0.7)
Eosinophils Relative: 2 % (ref 0–5)
HEMATOCRIT: 32.4 % — AB (ref 36.0–46.0)
HEMOGLOBIN: 10.2 g/dL — AB (ref 12.0–15.0)
LYMPHS PCT: 22 % (ref 12–46)
Lymphs Abs: 4 10*3/uL (ref 0.7–4.0)
MCH: 20.2 pg — ABNORMAL LOW (ref 26.0–34.0)
MCHC: 31.5 g/dL (ref 30.0–36.0)
MCV: 64.2 fL — ABNORMAL LOW (ref 78.0–100.0)
Monocytes Absolute: 1.3 10*3/uL — ABNORMAL HIGH (ref 0.1–1.0)
Monocytes Relative: 7 % (ref 3–12)
Neutro Abs: 12.7 10*3/uL — ABNORMAL HIGH (ref 1.7–7.7)
Neutrophils Relative %: 69 % (ref 43–77)
Platelets: 710 10*3/uL — ABNORMAL HIGH (ref 150–400)
RBC: 5.05 MIL/uL (ref 3.87–5.11)
RDW: 19 % — AB (ref 11.5–15.5)
WBC: 18.4 10*3/uL — AB (ref 4.0–10.5)

## 2013-11-25 LAB — URINALYSIS, ROUTINE W REFLEX MICROSCOPIC
GLUCOSE, UA: 250 mg/dL — AB
Hgb urine dipstick: NEGATIVE
KETONES UR: NEGATIVE mg/dL
Nitrite: NEGATIVE
PH: 5 (ref 5.0–8.0)
Protein, ur: 100 mg/dL — AB
Specific Gravity, Urine: 1.034 — ABNORMAL HIGH (ref 1.005–1.030)
Urobilinogen, UA: 1 mg/dL (ref 0.0–1.0)

## 2013-11-25 LAB — CBG MONITORING, ED
Glucose-Capillary: 161 mg/dL — ABNORMAL HIGH (ref 70–99)
Glucose-Capillary: 211 mg/dL — ABNORMAL HIGH (ref 70–99)

## 2013-11-25 LAB — RAPID URINE DRUG SCREEN, HOSP PERFORMED
AMPHETAMINES: NOT DETECTED
BENZODIAZEPINES: NOT DETECTED
Barbiturates: POSITIVE — AB
Cocaine: POSITIVE — AB
OPIATES: NOT DETECTED
Tetrahydrocannabinol: POSITIVE — AB

## 2013-11-25 LAB — HCG, SERUM, QUALITATIVE: Preg, Serum: NEGATIVE

## 2013-11-25 LAB — LIPASE, BLOOD: Lipase: 32 U/L (ref 11–59)

## 2013-11-25 LAB — TROPONIN I: Troponin I: <0.3 ng/mL (ref <0.30)

## 2013-11-25 MED ORDER — DEXTROSE 5 % IV SOLN
1.0000 g | Freq: Once | INTRAVENOUS | Status: AC
  Administered 2013-11-25: 1 g via INTRAVENOUS
  Filled 2013-11-25: qty 10

## 2013-11-25 MED ORDER — OXYCODONE-ACETAMINOPHEN 5-325 MG PO TABS
1.0000 | ORAL_TABLET | Freq: Once | ORAL | Status: AC
  Administered 2013-11-25: 1 via ORAL
  Filled 2013-11-25: qty 1

## 2013-11-25 MED ORDER — ONDANSETRON HCL 4 MG PO TABS: 4.0000 mg | ORAL_TABLET | Freq: Four times a day (QID) | ORAL | Status: DC

## 2013-11-25 MED ORDER — CEPHALEXIN 500 MG PO CAPS: 500.0000 mg | ORAL_CAPSULE | Freq: Two times a day (BID) | ORAL | Status: DC

## 2013-11-25 NOTE — ED Provider Notes (Addendum)
0215 - Patient care assumed from [facility], PA-C at shift change. Patient presents for abdominal pain with N/V and mild chest pain. Suspected exacerbation of known chronic abdominal pain. Imaging pending. Disposition plan discussed with [place], PA-C which includes d/c if imaging negative and HR improves with IVF.  Imaging reviewed today which shows no evidence of bowel obstruction. No free air. No evidence of acute cardiopulmonary process. Will continue with IV fluids and monitor her heart rate. Anticipate discharge when heart rate improved.   Filed Vitals:   11/24/13 2307 11/25/13 0148  BP: 137/86 164/101  Pulse:  94  Temp: 98.7 F (37.1 C) 98.3 F (36.8 C)  TempSrc: Oral   Resp: 19 20  Height: 5\' 7"  (1.702 m)   Weight: 196 lb (88.905 kg)   SpO2: 100%     Results for orders placed during the hospital encounter of 11/24/13  CBC WITH DIFFERENTIAL      Result Value Ref Range   WBC 18.4 (*) 4.0 - 10.5 K/uL   RBC 5.05  3.87 - 5.11 MIL/uL   Hemoglobin 10.2 (*) 12.0 - 15.0 g/dL   HCT 32.4 (*) 36.0 - 46.0 %   MCV 64.2 (*) 78.0 - 100.0 fL   MCH 20.2 (*) 26.0 - 34.0 pg   MCHC 31.5  30.0 - 36.0 g/dL   RDW 19.0 (*) 11.5 - 15.5 %   Platelets 710 (*) 150 - 400 K/uL   Neutrophils Relative % 69  43 - 77 %   Lymphocytes Relative 22  12 - 46 %   Monocytes Relative 7  3 - 12 %   Eosinophils Relative 2  0 - 5 %   Basophils Relative 0  0 - 1 %   Neutro Abs 12.7 (*) 1.7 - 7.7 K/uL   Lymphs Abs 4.0  0.7 - 4.0 K/uL   Monocytes Absolute 1.3 (*) 0.1 - 1.0 K/uL   Eosinophils Absolute 0.4  0.0 - 0.7 K/uL   Basophils Absolute 0.0  0.0 - 0.1 K/uL   RBC Morphology POLYCHROMASIA PRESENT     WBC Morphology VACUOLATED NEUTROPHILS     Smear Review LARGE PLATELETS PRESENT    COMPREHENSIVE METABOLIC PANEL      Result Value Ref Range   Sodium 132 (*) 137 - 147 mEq/L   Potassium 3.9  3.7 - 5.3 mEq/L   Chloride 93 (*) 96 - 112 mEq/L   CO2 22  19 - 32 mEq/L   Glucose, Bld 228 (*) 70 - 99 mg/dL   BUN 19  6 - 23 mg/dL   Creatinine, Ser 1.03  0.50 - 1.10 mg/dL   Calcium 10.4  8.4 - 10.5 mg/dL   Total Protein 9.4 (*) 6.0 - 8.3 g/dL   Albumin 3.6  3.5 - 5.2 g/dL   AST 14  0 - 37 U/L   ALT 6  0 - 35 U/L   Alkaline Phosphatase 71  39 - 117 U/L   Total Bilirubin 0.5  0.3 - 1.2 mg/dL   GFR calc non Af Amer 67 (*) >90 mL/min   GFR calc Af Amer 78 (*) >90 mL/min   Anion gap 17 (*) 5 - 15  LIPASE, BLOOD      Result Value Ref Range   Lipase 32  11 - 59 U/L  URINALYSIS, ROUTINE W REFLEX MICROSCOPIC      Result Value Ref Range   Color, Urine AMBER (*) YELLOW   APPearance TURBID (*) CLEAR   Specific Gravity, Urine  1.034 (*) 1.005 - 1.030   pH 5.0  5.0 - 8.0   Glucose, UA 250 (*) NEGATIVE mg/dL   Hgb urine dipstick NEGATIVE  NEGATIVE   Bilirubin Urine MODERATE (*) NEGATIVE   Ketones, ur NEGATIVE  NEGATIVE mg/dL   Protein, ur 100 (*) NEGATIVE mg/dL   Urobilinogen, UA 1.0  0.0 - 1.0 mg/dL   Nitrite NEGATIVE  NEGATIVE   Leukocytes, UA SMALL (*) NEGATIVE  URINE RAPID DRUG SCREEN (HOSP PERFORMED)      Result Value Ref Range   Opiates NONE DETECTED  NONE DETECTED   Cocaine POSITIVE (*) NONE DETECTED   Benzodiazepines NONE DETECTED  NONE DETECTED   Amphetamines NONE DETECTED  NONE DETECTED   Tetrahydrocannabinol POSITIVE (*) NONE DETECTED   Barbiturates POSITIVE (*) NONE DETECTED  TROPONIN I      Result Value Ref Range   Troponin I <0.30  <0.30 ng/mL  HCG, SERUM, QUALITATIVE      Result Value Ref Range   Preg, Serum NEGATIVE  NEGATIVE  URINE MICROSCOPIC-ADD ON      Result Value Ref Range   Squamous Epithelial / LPF MANY (*) RARE   WBC, UA 7-10  <3 WBC/hpf   Bacteria, UA MANY (*) RARE  CBG MONITORING, ED      Result Value Ref Range   Glucose-Capillary 211 (*) 70 - 99 mg/dL   Dg Abd Acute W/chest  11/25/2013   CLINICAL DATA:  Chest pain and abdominal pain  EXAM: ACUTE ABDOMEN SERIES (ABDOMEN 2 VIEW & CHEST 1 VIEW)  COMPARISON:  11/09/2013 abdominal radiograph  FINDINGS: There is no  evidence of dilated bowel loops or free intraperitoneal air. No radiopaque calculi or other significant radiographic abnormality is seen. Heart size and mediastinal contours are within normal limits. Both lungs are clear.  IMPRESSION: Negative abdominal radiographs.  No acute cardiopulmonary disease.   Electronically Signed   By: Jorje Guild M.D.   On: 11/25/2013 02:11   Antonietta Breach, PA-C 11/25/13 0221   0330 - Patient resistant to leaving the emergency department. Patient states that she came with pain and her pain has not been addressed. I have reviewed the patient's chart. The patient was found to have cocaine in her system today. She also has a known history of chronic abdominal pain as well as similar chest pain. I believe that the patient's pain is consistent with her known chronic pain complaints. I have explained to the patient that the emergency department is not the appropriate venue for the management of chronic pain. Given her history of substance abuse and her failure to followup as an outpatient with the appropriate specialists, I do not believe the patient is invested in proper treatment of her medical condition, but rather is seeking out pain control only. I have offered the patient one tablet of Percocet prior to discharge which she accepts. She will not be discharged with further pain medications. Tylenol or ibuprofen advised should pain persist. Patient verbalizes understanding with plan. Patient discharged in good condition.    Antonietta Breach, [place] 11/25/13 [phone]

## 2013-11-25 NOTE — ED Notes (Signed)
MD at bedside. 

## 2013-11-25 NOTE — ED Provider Notes (Signed)
Medical screening examination/treatment/procedure(s) were performed by non-physician practitioner and as supervising physician I was immediately available for consultation/collaboration.   EKG Interpretation   Date/Time:  Monday November 24 2013 23:06:39 EDT Ventricular Rate:  143 PR Interval:  81 QRS Duration: 119 QT Interval:  352 QTC Calculation: 543 R Axis:   -2 Text Interpretation:  Sinus or ectopic atrial tachycardia Nonspecific  intraventricular conduction delay Inferior infarct, age indeterminate  Anterior infarct, old No significant change was found Confirmed by Chari Parmenter   MD, Ariannie Penaloza ([id number]) on 11/24/2013 11:17:36 PM        Hoy Morn, MD 11/25/13 [phone]

## 2013-11-25 NOTE — ED Notes (Signed)
Patient transported to X-ray 

## 2013-11-25 NOTE — ED Provider Notes (Signed)
Medical screening examination/treatment/procedure(s) were performed by non-physician practitioner and as supervising physician I was immediately available for consultation/collaboration.   EKG Interpretation   Date/Time:  Monday November 24 2013 23:06:39 EDT Ventricular Rate:  143 PR Interval:  81 QRS Duration: 119 QT Interval:  352 QTC Calculation: 543 R Axis:   -2 Text Interpretation:  Sinus or ectopic atrial tachycardia Nonspecific  intraventricular conduction delay Inferior infarct, age indeterminate  Anterior infarct, old No significant change was found Confirmed by Charmine Bockrath   MD, Channie Bostick ([id number]) on 11/24/2013 11:17:36 PM        Hoy Morn, MD 11/25/13 [phone]

## 2013-11-25 NOTE — Discharge Instructions (Signed)
Please call your doctor for a followup appointment within 24-48 hours. When you talk to your doctor please let them know that you were seen in the emergency department and have them acquire all of your records so that they can discuss the findings with you and formulate a treatment plan to fully care for your new and ongoing problems. Please call and set up an appointment with your primary care provider to be seen and reassessed Please call and set up an appointment with [facility] urology regarding ongoing abdominal pain Please drink plenty of water Please take antibiotics for urinary tract infection Please continue to monitor symptoms closely if symptoms are to worsen or change (fever greater than 101, chills, chest pain, shortness of breath, difficulty breathing, worsening or changes to pain pattern, blood in the stools, black tarry stools, weakness, numbness, tingling) please report back to the ED immediately   Abdominal Pain Many things can cause abdominal pain. Usually, abdominal pain is not caused by a disease and will improve without treatment. It can often be observed and treated at home. Your health care provider will do a physical exam and possibly order blood tests and X-rays to help determine the seriousness of your pain. However, in many cases, more time must pass before a clear cause of the pain can be found. Before that point, your health care provider may not know if you need more testing or further treatment. HOME CARE INSTRUCTIONS  Monitor your abdominal pain for any changes. The following actions may help to alleviate any discomfort you are experiencing:  Only take over-the-counter or prescription medicines as directed by your health care provider.  Do not take laxatives unless directed to do so by your health care provider.  Try a clear liquid diet (broth, tea, or water) as directed by your health care provider. Slowly move to a bland diet as tolerated. SEEK MEDICAL CARE IF:  You  have unexplained abdominal pain.  You have abdominal pain associated with nausea or diarrhea.  You have pain when you urinate or have a bowel movement.  You experience abdominal pain that wakes you in the night.  You have abdominal pain that is worsened or improved by eating food.  You have abdominal pain that is worsened with eating fatty foods.  You have a fever. SEEK IMMEDIATE MEDICAL CARE IF:   Your pain does not go away within 2 hours.  You keep throwing up (vomiting).  Your pain is felt only in portions of the abdomen, such as the right side or the left lower portion of the abdomen.  You pass bloody or black tarry stools. MAKE SURE YOU:  Understand these instructions.   Will watch your condition.   Will get help right away if you are not doing well or get worse.  Document Released: 01/18/2005 Document Revised: 04/15/2013 Document Reviewed: 12/18/2012 [facility] Patient Information 2015 [place], [place]. This information is not intended to replace advice given to you by your health care provider. Make sure you discuss any questions you have with your health care provider. Urinary Tract Infection Urinary tract infections (UTIs) can develop anywhere along your urinary tract. Your urinary tract is your body's drainage system for removing wastes and extra water. Your urinary tract includes two kidneys, two ureters, a bladder, and a urethra. Your kidneys are a pair of bean-shaped organs. Each kidney is about the size of your fist. They are located below your ribs, one on each side of your spine. CAUSES Infections are caused by microbes,  which are microscopic organisms, including fungi, viruses, and bacteria. These organisms are so small that they can only be seen through a microscope. Bacteria are the microbes that most commonly cause UTIs. SYMPTOMS  Symptoms of UTIs may vary by age and gender of the patient and by the location of the infection. Symptoms in young women typically  include a frequent and intense urge to urinate and a painful, burning feeling in the bladder or urethra during urination. Older women and men are more likely to be tired, shaky, and weak and have muscle aches and abdominal pain. A fever may mean the infection is in your kidneys. Other symptoms of a kidney infection include pain in your back or sides below the ribs, nausea, and vomiting. DIAGNOSIS To diagnose a UTI, your caregiver will ask you about your symptoms. Your caregiver also will ask to provide a urine sample. The urine sample will be tested for bacteria and white blood cells. White blood cells are made by your body to help fight infection. TREATMENT  Typically, UTIs can be treated with medication. Because most UTIs are caused by a bacterial infection, they usually can be treated with the use of antibiotics. The choice of antibiotic and length of treatment depend on your symptoms and the type of bacteria causing your infection. HOME CARE INSTRUCTIONS  If you were prescribed antibiotics, take them exactly as your caregiver instructs you. Finish the medication even if you feel better after you have only taken some of the medication.  Drink enough water and fluids to keep your urine clear or pale yellow.  Avoid caffeine, tea, and carbonated beverages. They tend to irritate your bladder.  Empty your bladder often. Avoid holding urine for long periods of time.  Empty your bladder before and after sexual intercourse.  After a bowel movement, women should cleanse from front to back. Use each tissue only once. SEEK MEDICAL CARE IF:   You have back pain.  You develop a fever.  Your symptoms do not begin to resolve within 3 days. SEEK IMMEDIATE MEDICAL CARE IF:   You have severe back pain or lower abdominal pain.  You develop chills.  You have nausea or vomiting.  You have continued burning or discomfort with urination. MAKE SURE YOU:   Understand these instructions.  Will watch  your condition.  Will get help right away if you are not doing well or get worse. Document Released: 01/18/2005 Document Revised: 10/10/2011 Document Reviewed: 05/19/2011 [facility] Patient Information 2015 [place], [place]. This information is not intended to replace advice given to you by your health care provider. Make sure you discuss any questions you have with your health care provider.

## 2013-11-26 LAB — URINE CULTURE: Special Requests: NORMAL

## 2013-11-26 NOTE — ED Provider Notes (Signed)
Medical screening examination/treatment/procedure(s) were performed by non-physician practitioner and as supervising physician I was immediately available for consultation/collaboration.   EKG Interpretation   Date/Time:  Monday November 24 2013 23:06:39 EDT Ventricular Rate:  143 PR Interval:  81 QRS Duration: 119 QT Interval:  352 QTC Calculation: 543 R Axis:   -2 Text Interpretation:  Sinus or ectopic atrial tachycardia Nonspecific  intraventricular conduction delay Inferior infarct, age indeterminate  Anterior infarct, old No significant change was found Confirmed by Aiven Kampe   MD, Bao Coreas ([id number]) on 11/24/2013 11:17:36 PM        Hoy Morn, MD 11/26/13 [phone]

## 2013-12-11 ENCOUNTER — Emergency Department (HOSPITAL_COMMUNITY)
Admission: EM | Admit: 2013-12-11 | Discharge: 2013-12-12 | Disposition: A | Discharge status: Home / Self Care | Payer: Medicaid Other | Attending: Emergency Medicine | Admitting: Emergency Medicine

## 2013-12-11 ENCOUNTER — Encounter (HOSPITAL_COMMUNITY): Payer: Self-pay | Admitting: Emergency Medicine

## 2013-12-11 DIAGNOSIS — R000 Tachycardia, unspecified: Secondary | ICD-10-CM | POA: Insufficient documentation

## 2013-12-11 DIAGNOSIS — G609 Hereditary and idiopathic neuropathy, unspecified: Secondary | ICD-10-CM | POA: Insufficient documentation

## 2013-12-11 DIAGNOSIS — R0789 Other chest pain: Secondary | ICD-10-CM | POA: Diagnosis not present

## 2013-12-11 DIAGNOSIS — Z8719 Personal history of other diseases of the digestive system: Secondary | ICD-10-CM | POA: Diagnosis not present

## 2013-12-11 DIAGNOSIS — I252 Old myocardial infarction: Secondary | ICD-10-CM | POA: Insufficient documentation

## 2013-12-11 DIAGNOSIS — I10 Essential (primary) hypertension: Secondary | ICD-10-CM | POA: Insufficient documentation

## 2013-12-11 DIAGNOSIS — I2510 Atherosclerotic heart disease of native coronary artery without angina pectoris: Secondary | ICD-10-CM | POA: Diagnosis not present

## 2013-12-11 DIAGNOSIS — IMO0002 Reserved for concepts with insufficient information to code with codable children: Secondary | ICD-10-CM | POA: Diagnosis not present

## 2013-12-11 DIAGNOSIS — Z859 Personal history of malignant neoplasm, unspecified: Secondary | ICD-10-CM | POA: Insufficient documentation

## 2013-12-11 DIAGNOSIS — G43909 Migraine, unspecified, not intractable, without status migrainosus: Secondary | ICD-10-CM | POA: Insufficient documentation

## 2013-12-11 DIAGNOSIS — IMO0001 Reserved for inherently not codable concepts without codable children: Secondary | ICD-10-CM | POA: Diagnosis not present

## 2013-12-11 DIAGNOSIS — Z87891 Personal history of nicotine dependence: Secondary | ICD-10-CM | POA: Diagnosis not present

## 2013-12-11 DIAGNOSIS — Z9861 Coronary angioplasty status: Secondary | ICD-10-CM | POA: Diagnosis not present

## 2013-12-11 DIAGNOSIS — F411 Generalized anxiety disorder: Secondary | ICD-10-CM | POA: Insufficient documentation

## 2013-12-11 DIAGNOSIS — E118 Type 2 diabetes mellitus with unspecified complications: Secondary | ICD-10-CM

## 2013-12-11 DIAGNOSIS — Z8742 Personal history of other diseases of the female genital tract: Secondary | ICD-10-CM | POA: Insufficient documentation

## 2013-12-11 DIAGNOSIS — R109 Unspecified abdominal pain: Secondary | ICD-10-CM | POA: Insufficient documentation

## 2013-12-11 DIAGNOSIS — F1410 Cocaine abuse, uncomplicated: Secondary | ICD-10-CM | POA: Diagnosis not present

## 2013-12-11 DIAGNOSIS — R079 Chest pain, unspecified: Secondary | ICD-10-CM | POA: Diagnosis present

## 2013-12-11 DIAGNOSIS — R112 Nausea with vomiting, unspecified: Secondary | ICD-10-CM | POA: Insufficient documentation

## 2013-12-11 DIAGNOSIS — Z792 Long term (current) use of antibiotics: Secondary | ICD-10-CM | POA: Diagnosis not present

## 2013-12-11 DIAGNOSIS — R1032 Left lower quadrant pain: Secondary | ICD-10-CM | POA: Insufficient documentation

## 2013-12-11 DIAGNOSIS — Z872 Personal history of diseases of the skin and subcutaneous tissue: Secondary | ICD-10-CM | POA: Diagnosis not present

## 2013-12-11 DIAGNOSIS — Z8673 Personal history of transient ischemic attack (TIA), and cerebral infarction without residual deficits: Secondary | ICD-10-CM | POA: Insufficient documentation

## 2013-12-11 DIAGNOSIS — E1165 Type 2 diabetes mellitus with hyperglycemia: Secondary | ICD-10-CM | POA: Diagnosis not present

## 2013-12-11 DIAGNOSIS — J45909 Unspecified asthma, uncomplicated: Secondary | ICD-10-CM | POA: Diagnosis not present

## 2013-12-11 DIAGNOSIS — Z794 Long term (current) use of insulin: Secondary | ICD-10-CM | POA: Insufficient documentation

## 2013-12-11 DIAGNOSIS — Z862 Personal history of diseases of the blood and blood-forming organs and certain disorders involving the immune mechanism: Secondary | ICD-10-CM | POA: Insufficient documentation

## 2013-12-11 DIAGNOSIS — G8929 Other chronic pain: Secondary | ICD-10-CM | POA: Insufficient documentation

## 2013-12-11 DIAGNOSIS — E669 Obesity, unspecified: Secondary | ICD-10-CM | POA: Insufficient documentation

## 2013-12-11 DIAGNOSIS — Z8619 Personal history of other infectious and parasitic diseases: Secondary | ICD-10-CM | POA: Insufficient documentation

## 2013-12-11 DIAGNOSIS — Z3202 Encounter for pregnancy test, result negative: Secondary | ICD-10-CM | POA: Insufficient documentation

## 2013-12-11 DIAGNOSIS — Z79899 Other long term (current) drug therapy: Secondary | ICD-10-CM | POA: Insufficient documentation

## 2013-12-11 DIAGNOSIS — E785 Hyperlipidemia, unspecified: Secondary | ICD-10-CM | POA: Insufficient documentation

## 2013-12-11 NOTE — ED Notes (Signed)
Pt presents with c/o chest pain that started about one hour ago and vomiting. Pt is thrashing around in the chair and gagging into an emesis bag very loudly, no vomit. Tachycardic in traige, 135. As soon as pt stops gagging into the bag, HR goes back down.

## 2013-12-12 LAB — CBG MONITORING, ED: Glucose-Capillary: 161 mg/dL — ABNORMAL HIGH (ref 70–99)

## 2013-12-12 LAB — COMPREHENSIVE METABOLIC PANEL
ALBUMIN: 3.2 g/dL — AB (ref 3.5–5.2)
ALK PHOS: 60 U/L (ref 39–117)
ALT: 6 U/L (ref 0–35)
AST: 19 U/L (ref 0–37)
Anion gap: 14 (ref 5–15)
BILIRUBIN TOTAL: 0.3 mg/dL (ref 0.3–1.2)
BUN: 8 mg/dL (ref 6–23)
CHLORIDE: 101 meq/L (ref 96–112)
CO2: 21 mEq/L (ref 19–32)
CREATININE: 0.79 mg/dL (ref 0.50–1.10)
Calcium: 9.4 mg/dL (ref 8.4–10.5)
GLUCOSE: 152 mg/dL — AB (ref 70–99)
POTASSIUM: 4.1 meq/L (ref 3.7–5.3)
Sodium: 136 mEq/L — ABNORMAL LOW (ref 137–147)
Total Protein: 7.9 g/dL (ref 6.0–8.3)

## 2013-12-12 LAB — CBC WITH DIFFERENTIAL/PLATELET
Basophils Absolute: 0 10*3/uL (ref 0.0–0.1)
Basophils Relative: 0 % (ref 0–1)
EOS ABS: 0.6 10*3/uL (ref 0.0–0.7)
Eosinophils Relative: 5 % (ref 0–5)
HEMATOCRIT: 30.5 % — AB (ref 36.0–46.0)
Hemoglobin: 9.7 g/dL — ABNORMAL LOW (ref 12.0–15.0)
Lymphocytes Relative: 27 % (ref 12–46)
Lymphs Abs: 3.4 10*3/uL (ref 0.7–4.0)
MCH: 19.9 pg — ABNORMAL LOW (ref 26.0–34.0)
MCHC: 31.8 g/dL (ref 30.0–36.0)
MCV: 62.5 fL — ABNORMAL LOW (ref 78.0–100.0)
MONOS PCT: 7 % (ref 3–12)
Monocytes Absolute: 0.9 10*3/uL (ref 0.1–1.0)
NEUTROS PCT: 61 % (ref 43–77)
Neutro Abs: 7.8 10*3/uL — ABNORMAL HIGH (ref 1.7–7.7)
PLATELETS: 562 10*3/uL — AB (ref 150–400)
RBC: 4.88 MIL/uL (ref 3.87–5.11)
RDW: 18.9 % — AB (ref 11.5–15.5)
WBC: 12.7 10*3/uL — AB (ref 4.0–10.5)

## 2013-12-12 LAB — URINALYSIS, ROUTINE W REFLEX MICROSCOPIC
BILIRUBIN URINE: NEGATIVE
GLUCOSE, UA: 100 mg/dL — AB
Hgb urine dipstick: NEGATIVE
KETONES UR: NEGATIVE mg/dL
NITRITE: NEGATIVE
Protein, ur: NEGATIVE mg/dL
Specific Gravity, Urine: 1.012 (ref 1.005–1.030)
Urobilinogen, UA: 0.2 mg/dL (ref 0.0–1.0)
pH: 7.5 (ref 5.0–8.0)

## 2013-12-12 LAB — URINE MICROSCOPIC-ADD ON

## 2013-12-12 LAB — RAPID URINE DRUG SCREEN, HOSP PERFORMED
Amphetamines: NOT DETECTED
Barbiturates: NOT DETECTED
Benzodiazepines: NOT DETECTED
COCAINE: POSITIVE — AB
OPIATES: NOT DETECTED
Tetrahydrocannabinol: NOT DETECTED

## 2013-12-12 LAB — POC URINE PREG, ED: Preg Test, Ur: NEGATIVE

## 2013-12-12 LAB — LIPASE, BLOOD: LIPASE: 31 U/L (ref 11–59)

## 2013-12-12 MED ORDER — PROMETHAZINE HCL 25 MG RE SUPP: 25.0000 mg | Freq: Four times a day (QID) | RECTAL | Status: DC | PRN

## 2013-12-12 MED ORDER — HYDROMORPHONE HCL PF 1 MG/ML IJ SOLN
0.5000 mg | Freq: Once | INTRAMUSCULAR | Status: AC
  Administered 2013-12-12: 0.5 mg via INTRAVENOUS
  Filled 2013-12-12: qty 1

## 2013-12-12 MED ORDER — DIPHENHYDRAMINE HCL 50 MG/ML IJ SOLN
12.5000 mg | Freq: Once | INTRAMUSCULAR | Status: AC
  Administered 2013-12-12: 12.5 mg via INTRAVENOUS
  Filled 2013-12-12: qty 1

## 2013-12-12 MED ORDER — LORAZEPAM 2 MG/ML IJ SOLN
0.5000 mg | Freq: Once | INTRAMUSCULAR | Status: AC
  Administered 2013-12-12: 0.5 mg via INTRAVENOUS
  Filled 2013-12-12: qty 1

## 2013-12-12 MED ORDER — METOCLOPRAMIDE HCL 5 MG/ML IJ SOLN
10.0000 mg | Freq: Once | INTRAMUSCULAR | Status: AC
  Administered 2013-12-12: 10 mg via INTRAVENOUS
  Filled 2013-12-12: qty 2

## 2013-12-12 MED ORDER — SODIUM CHLORIDE 0.9 % IV BOLUS (SEPSIS)
1000.0000 mL | Freq: Once | INTRAVENOUS | Status: AC
  Administered 2013-12-12: 1000 mL via INTRAVENOUS

## 2013-12-12 MED ORDER — LORAZEPAM 2 MG/ML IJ SOLN
1.0000 mg | Freq: Once | INTRAMUSCULAR | Status: AC
  Administered 2013-12-12: 1 mg via INTRAVENOUS
  Filled 2013-12-12: qty 1

## 2013-12-12 MED ORDER — ZIPRASIDONE MESYLATE 20 MG IM SOLR: 20.0000 mg | Freq: Once | INTRAMUSCULAR | Status: DC

## 2013-12-12 MED ORDER — ONDANSETRON HCL 4 MG/2ML IJ SOLN
4.0000 mg | Freq: Once | INTRAMUSCULAR | Status: AC
  Administered 2013-12-12: 4 mg via INTRAVENOUS
  Filled 2013-12-12: qty 2

## 2013-12-12 NOTE — ED Provider Notes (Signed)
CSN: [id number]     Arrival date & time 12/11/13  2346 History   First MD Initiated Contact with Patient 12/12/13 0001     Chief Complaint  Patient presents with  . Chest Pain  . Emesis     (Consider location/radiation/quality/duration/timing/severity/associated sxs/prior Treatment) HPI Comments: Shirley [name] is a 38 y.o. Female with an extensive PMHx including chronic lower abd pain, N/V, GERD, gastritis, uterine fibroids, DM, HTN, HLD, and iron deficiency anemia, who presents today for recurrent lower abd pain x2 days, crampy, severe, constant, nonradiating, with no known specific aggravating or alleviating factors, that she states is "the same as my usual abdominal pain", associated with N/V of stomach contents (nonbloody, nonbilious), which has caused her some CP in the epigastric region due to the irritation from vomiting. States the CP does not feel at all cardiac, and only began after she was vomiting today. Describes it as burning, nonradiating, gradual onset 1 day prior, intermittent, unchanged, worse with vomiting, and no known alleviating factors but subsides once she does not vomit. Endorses trying tylenol for her pain with no relief. States she's had this same pain in the past with negative work ups, stating "no one can figure out what's wrong". Reports that she has not used her insulin in 2 days and has not been checking her sugars. States she was diagnosed when she was 79, believes she's type 1 diabetic but isn't sure, and takes novolog as a sliding scale, and Lantus 50U QHS. Last dose of insulin was 2 nights ago. Denies fevers, chills, SOB, cough, diarrhea, obstipation, constipation, hematochezia, melena, hematemesis, urinary symptoms, vaginal discharge or dyspareunia, syncope, weakness, lightheadedness, sick contacts, recent travel, suspicious food intake, EtOH or NSAID use, hx of DKA, prior abdominal surgeries, or sexual intercourse in 2 months. Denies multiple sexual partners.  Denies use of cocaine regularly, states her last use was 3 days ago. Has never had withdrawals. Denies SI/HI or hallucinations. Last BM this AM, continues to pass flatus, denies abd distension.   Patient is a 38 y.o. female presenting with chest pain and vomiting. The history is provided by the patient. No language interpreter was used.  Chest Pain Pain location:  Epigastric Pain quality: burning   Pain radiates to:  Does not radiate Pain radiates to the back: no   Pain severity:  Mild Onset quality:  Gradual Duration:  1 day Timing:  Intermittent Progression:  Unchanged Chronicity:  Recurrent Context comment:  Vomiting Relieved by:  None tried Exacerbated by: vomiting. Ineffective treatments:  None tried Associated symptoms: abdominal pain, heartburn, nausea and vomiting   Associated symptoms: no anxiety, no back pain, no cough, no diaphoresis, no dizziness, no dysphagia, no fever, no headache, no lower extremity edema, no near-syncope, no numbness, no orthopnea, no palpitations, no shortness of breath, no syncope and no weakness   Abdominal pain:    Location:  LLQ and suprapubic   Quality:  Cramping   Severity:  Severe   Onset quality:  Gradual   Duration:  2 days   Timing:  Constant   Progression:  Unchanged   Chronicity:  Chronic Risk factors: diabetes mellitus, high cholesterol, hypertension and obesity   Risk factors: no birth control, no prior DVT/PE, no smoking and no surgery   Emesis Severity:  Severe Duration:  2 days Timing:  Constant Number of daily episodes:  "every time I eat" Quality:  Stomach contents Progression:  Unchanged Chronicity:  Chronic Recent urination:  Normal Context: not post-tussive and not self-induced  Relieved by:  None tried Worsened by:  Nothing tried Ineffective treatments:  None tried Associated symptoms: abdominal pain   Associated symptoms: no arthralgias, no chills, no cough, no diarrhea, no fever, no headaches, no myalgias, no  sore throat and no URI   Risk factors: diabetes   Risk factors: no alcohol use, not pregnant now, no prior abdominal surgery, no sick contacts, no suspect food intake and no travel to endemic areas     Past Medical History  Diagnosis Date  . Thrombocytosis     Hem/Onc suggested 2/2 chronic hepatits and/or iron deficiency anemia  . Iron deficiency anemia   . N&V (nausea and vomiting)     Chronic. Unclear etiology with multiple admission and ED visits. CT abdomen with and without contrast (02/2011)  showed no acute process. Gastic Emptying scan (01/2010) was normal. Ultrasound of the abdomen was within normal limits. Hepatitis B viral load was undectable. HIV NR. EGD - gastritis, Hpylori + s/p Rx  . Hypertension   . Hyperlipidemia   . Diabetes mellitus type 2, uncontrolled, with complications   . Recurrent boils   . Back pain   . OSA (obstructive sleep apnea)   . 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 Cath 12/10- no restenosis or significant CAD progression  . Irregular menses     Small ovarian follicles seen on NL(8/92)  . History of pyelonephritis     H/o GrpB Pyelonephritis (9/06) and UTI- 07/11- E.Coli, 12/10- GBS  . Abscess of tunica vaginalis     10/09- Abundant S. aureus- sensitive to all abx  . Obesity   . Asthma   . Gastritis   . Depression   . Hepatitis B, chronic     Hep BeAb+,Hep B cAb+ & Hep BsAg+ (9/06)  . Peripheral neuropathy   . Fibromyalgia   . GERD (gastroesophageal reflux disease)   . Migraine   . Anxiety   . Gastroparesis     secondary to poorly controlled DM, last emptying study performed 01/2010  was normal but may be falsely positive as pt was on reglan  . Blood dyscrasia   . H/O: CVA (cerebrovascular accident)     history of remote right cerebellar infarct noted on head CT at least since 10/2011  . Cancer   . MI (myocardial infarction)    . Substance abuse    Past Surgical History  Procedure Laterality Date  . Cesarean section  1997   Family History  Problem Relation Age of Onset  . Diabetes Father    History  Substance Use Topics  . Smoking status: Former Smoker    Types: Cigarettes    Quit date: 04/24/1996  . Smokeless tobacco: Never Used     Comment: quit smoking cigarettes age 87  . Alcohol Use: Yes     Comment: occasionally   OB History   Grav Para Term Preterm Abortions TAB SAB Ect Mult Living                 Review of Systems  Constitutional: Negative for fever, chills, diaphoresis and appetite change.  HENT: Negative for congestion, sore throat and trouble swallowing.   Respiratory: Negative for cough and shortness of breath.   Cardiovascular: Positive for chest pain. Negative for palpitations, orthopnea, leg swelling, syncope and near-syncope.  Gastrointestinal: Positive for heartburn, nausea, vomiting and abdominal pain. Negative for diarrhea, constipation, blood in  stool, abdominal distention and rectal pain.  Endocrine: Negative for polydipsia and polyuria.  Genitourinary: Negative for dysuria, urgency, frequency, hematuria, flank pain, decreased urine volume, vaginal bleeding, vaginal discharge, difficulty urinating, vaginal pain, menstrual problem, pelvic pain and dyspareunia.  Musculoskeletal: Negative for arthralgias, back pain and myalgias.  Skin: Negative for rash.  Neurological: Negative for dizziness, syncope, weakness, numbness and headaches.  Psychiatric/Behavioral: Negative for confusion.  10 Systems reviewed and are negative for acute change except as noted in the HPI.     Allergies  Lisinopril and Morphine and related  Home Medications   Prior to Admission medications   Medication Sig Start Date End Date Taking? Authorizing Provider  insulin aspart (NOVOLOG) 100 UNIT/ML injection Sliding scale- If sugar 150-199: inject 2 unit;  200-249: 4 units, 250-299: 7 units; 300-349: 10  units; Over 350: 12 units 11/11/13  Yes Bartholomew Crews, MD  Insulin Glargine (LANTUS SOLOSTAR) 100 UNIT/ML Solostar Pen Inject 50 Units into the skin daily at 10 pm. 11/11/13  Yes Bartholomew Crews, MD  albuterol (PROVENTIL HFA;VENTOLIN HFA) 108 (90 BASE) MCG/ACT inhaler Inhale 2 puffs into the lungs every 4 (four) hours as needed for wheezing or shortness of breath.    Historical Provider, MD  atorvastatin (LIPITOR) 80 MG tablet Take 1 tablet (80 mg total) by mouth daily at 6 PM. 07/25/13   Rebecca Eaton, MD  cephALEXin (KEFLEX) 500 MG capsule Take 1 capsule (500 mg total) by mouth 2 (two) times daily. 11/25/13   Marissa Sciacca, PA-C  gabapentin (NEURONTIN) 300 MG capsule Take 300 mg by mouth 3 (three) times daily as needed (pain).     Historical Provider, MD  HYDROcodone-acetaminophen (NORCO/VICODIN) 5-325 MG per tablet Take 1 tablet by mouth every 6 (six) hours as needed for moderate pain. 11/10/13   Resa Miner Lawyer, PA-C  labetalol (NORMODYNE) 100 MG tablet Take 1 tablet (100 mg total) by mouth 2 (two) times daily. 08/27/13   Neema Bobbie Stack, MD  ondansetron (ZOFRAN) 4 MG tablet Take 1 tablet (4 mg total) by mouth every 6 (six) hours. 10/15/13   Courtney A Forcucci, PA-C  ondansetron (ZOFRAN) 4 MG tablet Take 1 tablet (4 mg total) by mouth every 6 (six) hours. 11/25/13   Marissa Sciacca, PA-C  pregabalin (LYRICA) 75 MG capsule Take 1 capsule (75 mg total) by mouth 2 (two) times daily. 11/04/12   Valaria Good, MD   BP 116/67  Pulse 133  Temp(Src) 98.8 F (37.1 C) (Oral)  Resp 22  SpO2 99%  LMP 11/15/2013 Physical Exam  Nursing note and vitals reviewed. Constitutional: She is oriented to person, place, and time. She appears well-developed and well-nourished.  Non-toxic appearance. She appears distressed.  Obese AAF vomiting throughout exam, tachycardic but otherwise VSS. Occasionally answers questions and does not appear to retch  HENT:  Head: Normocephalic and atraumatic.  Nose:  Nose normal.  Mouth/Throat: Oropharynx is clear and moist. Mucous membranes are dry.  Oropharynx clear, mildly dry mucous membranes. Nose clear.  Eyes: Conjunctivae and EOM are normal. Right eye exhibits no discharge. Left eye exhibits no discharge.  Neck: Normal range of motion. Neck supple.  Cardiovascular: Regular rhythm, normal heart sounds and intact distal pulses.  Tachycardia present.  Exam reveals no gallop and no friction rub.   No murmur heard. Tachycardic while retching, regular rhythm, nl s1/s2 no m/r/g, intact distal pulses  Pulmonary/Chest: Effort normal and breath sounds normal. No respiratory distress. She has no decreased breath sounds. She has no  wheezes. She has no rhonchi. She has no rales.  No ketotic breath or kussmaul respirations  Abdominal: Soft. Normal appearance and bowel sounds are normal. She exhibits no distension. There is tenderness in the suprapubic area and left lower quadrant. There is no rigidity, no rebound, no guarding, no CVA tenderness, no tenderness at McBurney's point and negative Murphy's sign.    Obese abdomen which obscures some portions of exam. Soft, nondistended, +BS throughout. TTP in suprapubic area and LLQ, no R/G/R, neg murphy's, neg mcburney's, neg CVA TTP. Difficult to complete full exam due to patient writhing and vomiting throughout exam  Musculoskeletal: Normal range of motion.  Moving all extremities with ease  Neurological: She is alert and oriented to person, place, and time. She has normal strength. No sensory deficit.  Baseline strength and sensation  Skin: Skin is warm, dry and intact. No rash noted.  No rashes  Psychiatric: Her behavior is normal. Her mood appears anxious. She is not actively hallucinating. She expresses no homicidal and no suicidal ideation. She expresses no suicidal plans and no homicidal plans.    ED Course  Procedures (including critical care time) Labs Review Labs Reviewed  URINALYSIS, ROUTINE W REFLEX  MICROSCOPIC  COMPREHENSIVE METABOLIC PANEL  CBC WITH DIFFERENTIAL  LIPASE, BLOOD  URINE RAPID DRUG SCREEN (HOSP PERFORMED)  POC URINE PREG, ED    Imaging Review No results found.  Dg Abd Acute W/chest  11/25/2013   CLINICAL DATA:  Chest pain and abdominal pain  EXAM: ACUTE ABDOMEN SERIES (ABDOMEN 2 VIEW & CHEST 1 VIEW)  COMPARISON:  11/09/2013 abdominal radiograph  FINDINGS: There is no evidence of dilated bowel loops or free intraperitoneal air. No radiopaque calculi or other significant radiographic abnormality is seen. Heart size and mediastinal contours are within normal limits. Both lungs are clear.  IMPRESSION: Negative abdominal radiographs.  No acute cardiopulmonary disease.   Electronically Signed   By: Jorje Guild M.D.   On: 11/25/2013 02:11   11/09/13 ABDOMEN - 2 VIEW  COMPARISON: 07/31/2003 acute abdominal series. Abdominal pelvic CT  04/11/2013.  FINDINGS:  The bowel gas pattern is normal. There is no free intraperitoneal  air. Pelvic calcifications are stable. The osseous structures appear  unremarkable.  IMPRESSION:  No acute abdominal pelvic findings.  05/20/13 transvaginal U/S pelvis: IMPRESSION:  Posterior 4.9 cm fibroid abuts/distorts the endometrium.  Normal sonographic appearance to the ovaries. No fluid collection or  appreciable hydrosalpinx.  04/11/13 CT ABDOMEN AND PELVIS WITH CONTRAST  TECHNIQUE:  Multidetector CT imaging of the abdomen and pelvis was performed  using the standard protocol following bolus administration of  intravenous contrast.  CONTRAST: 59mL OMNIPAQUE IOHEXOL 300 MG/ML SOLN, 149mL OMNIPAQUE  IOHEXOL 300 MG/ML SOLN  COMPARISON: 08/15/2012  FINDINGS:  Visualized lung bases clear. Unremarkable liver, gallbladder,  spleen, adrenal glands, kidneys, pancreas, abdominal aorta, portal  vein. Stomach is nondistended. Small bowel and colon are nondilated.  Normal appendix. Degenerative disc disease L5-S1. Urinary bladder is  incompletely  distended and mildly thick-walled. Uterus and adnexal  regions unremarkable. No ascites. No free air. No adenopathy  localized.  IMPRESSION:  1. No acute abdominal process.    EKG Interpretation None      MDM   Final diagnoses:  None    38y/o female with recurrent abd pain, N/V, similar to previous episodes. Seen in ED 11 times in 6 mos, same CC dating back to November 2014 and beyond. Last 4 visits with relatively negative work ups including labs, CT, xrays, EKGs,  transvaginal u/s and pelvic exams. UTI diagnosed once recently, and remote hx of pyelo but none recently. Remote hx of fibroids. No hx of DKA admission in the last year. Last note from 11/25/13 questions pt's validity and possible drug seeking behaviors. Will obtain basic labs and U/A, Upreg, UDS. Will give IVFs, zofran 4mg , dilaudid 0.5mg , ativan 0.5mg , benadryl 12.5mg , and reglan 10mg . Patient with CP related to vomiting, once she can tolerate PO will give GI cocktail, EKG unchanged from prior, do not feel troponin is necessary, do not think this is cardiac in nature. Abd exam relatively unimpressive, no peritoneal signs, nonsurgical belly. I anticipate labs to be similar to prior, doubt DKA. Possible UTI, but doubt any other emergent condition. No pelvic performed, given negative prior pelvic exams and no sexual partners recently. No imaging given vast amount of negative images in last yr. Discussed case with Rodolph Bong PA-C who will follow up with labs and dispo pt home. Please see her dictation for further documentation of care.  BP 116/67  Pulse 133  Temp(Src) 98.8 F (37.1 C) (Oral)  Resp 22  SpO2 99%  LMP 11/15/2013  Meds ordered this encounter  Medications  . sodium chloride 0.9 % bolus 1,000 mL    Sig:   . LORazepam (ATIVAN) injection 0.5 mg    Sig:   . HYDROmorphone (DILAUDID) injection 0.5 mg    Sig:   . ondansetron (ZOFRAN) injection 4 mg    Sig:   . metoCLOPramide (REGLAN) injection 10 mg    Sig:     . diphenhydrAMINE (BENADRYL) injection 12.5 mg    Sig:      Patty Sermons Camprubi-Soms, PA-C 12/12/13 0149

## 2013-12-12 NOTE — Discharge Instructions (Signed)
Please follow up with your primary care physician in 1-2 days. If you do not have one please call the [facility] number listed above. Please use Phenergan suppositories to help with nausea and vomiting. Please read all discharge instructions and return precautions.   Stimulant Use Disorder-Cocaine Cocaine is one of a group of powerful drugs called stimulants. Cocaine has medical uses for stopping nosebleeds and for pain control before minor nose or dental surgery. However, cocaine is misused because of the effects that it produces. These effects include:   A feeling of extreme pleasure.  Alertness.  High energy. Common street names for cocaine include coke, crack, blow, snow, and nose candy. Cocaine is snorted, dissolved in water and injected, or smoked.  Stimulants are addictive because they activate regions of the brain that produce both the pleasurable sensation of "reward" and psychological dependence. Together, these actions account for loss of control and the rapid development of drug dependence. This means you become ill without the drug (withdrawal) and need to keep using it to function.  Stimulant use disorder is use of stimulants that disrupts your daily life. It disrupts relationships with family and friends and how you do your job. Cocaine increases your blood pressure and heart rate. It can cause a heart attack or stroke. Cocaine can also cause death from irregular heart rate or seizures. SYMPTOMS Symptoms of stimulant use disorder with cocaine include:  Use of cocaine in larger amounts or over a longer period of time than intended.  Unsuccessful attempts to cut down or control cocaine use.  A lot of time spent obtaining, using, or recovering from the effects of cocaine.  A strong desire or urge to use cocaine (craving).  Continued use of cocaine in spite of major problems at work, school, or home because of use.  Continued use of cocaine in spite of  relationship problems because of use.  Giving up or cutting down on important life activities because of cocaine use.  Use of cocaine over and over in situations when it is physically hazardous, such as driving a car.  Continued use of cocaine in spite of a physical problem that is likely related to use. Physical problems can include:  Malnutrition.  Nosebleeds.  Chest pain.  High blood pressure.  A hole that develops between the part of your nose that separates your nostrils (perforated nasal septum).  Lung and kidney damage.  Continued use of cocaine in spite of a mental problem that is likely related to use. Mental problems can include:  Schizophrenia-like symptoms.  Depression.  Bipolar mood swings.  Anxiety.  Sleep problems.  Need to use more and more cocaine to get the same effect, or lessened effect over time with use of the same amount of cocaine (tolerance).  Having withdrawal symptoms when cocaine use is stopped, or using cocaine to reduce or avoid withdrawal symptoms. Withdrawal symptoms include:  Depressed or irritable mood.  Low energy or restlessness.  Bad dreams.  Poor or excessive sleep.  Increased appetite. DIAGNOSIS Stimulant use disorder is diagnosed by your health care provider. You may be asked questions about your cocaine use and how it affects your life. A physical exam may be done. A drug screen may be ordered. You may be referred to a mental health professional. The diagnosis of stimulant use disorder requires at least two symptoms within 12 months. The type of stimulant use disorder depends on the number of signs and symptoms you have. The type may be:  Mild. Two or three signs and symptoms.  Moderate. Four or five signs and symptoms.  Severe. Six or more signs and symptoms. TREATMENT Treatment for stimulant use disorder is usually provided by mental health professionals with training in substance use disorders. The following options are  available:  Counseling or talk therapy. Talk therapy addresses the reasons you use cocaine and ways to keep you from using again. Goals of talk therapy include:  Identifying and avoiding triggers for use.  Handling cravings.  Replacing use with healthy activities.  Support groups. Support groups provide emotional support, advice, and guidance.  Medicine. Certain medicines may decrease cocaine cravings or withdrawal symptoms. HOME CARE INSTRUCTIONS  Take medicines only as directed by your health care provider.  Identify the people and activities that trigger your cocaine use and avoid them.  Keep all follow-up visits as directed by your health care provider. SEEK MEDICAL CARE IF:  Your symptoms get worse or you relapse.  You are not able to take medicines as directed. SEEK IMMEDIATE MEDICAL CARE IF:  You have serious thoughts about hurting yourself or others.  You have a seizure, chest pain, sudden weakness, or loss of speech or vision. [place] on Drug Abuse: motorcyclefax.com  Substance Abuse and Mental Health Services Administration: ktimeonline.com Document Released: 04/07/2000 Document Revised: 08/25/2013 Document Reviewed: 04/23/2013 [facility] Patient Information 2015 [place], [place]. This information is not intended to replace advice given to you by your health care provider. Make sure you discuss any questions you have with your health care provider.   Chest Pain (Nonspecific) It is often hard to give a specific diagnosis for the cause of chest pain. There is always a chance that your pain could be related to something serious, such as a heart attack or a blood clot in the lungs. You need to follow up with your health care provider for further evaluation. CAUSES   Heartburn.  Pneumonia or bronchitis.  Anxiety or stress.  Inflammation around your heart (pericarditis) or lung (pleuritis or pleurisy).  A blood clot in the lung.  A  collapsed lung (pneumothorax). It can develop suddenly on its own (spontaneous pneumothorax) or from trauma to the chest.  Shingles infection (herpes zoster virus). The chest wall is composed of bones, muscles, and cartilage. Any of these can be the source of the pain.  The bones can be bruised by injury.  The muscles or cartilage can be strained by coughing or overwork.  The cartilage can be affected by inflammation and become sore (costochondritis). DIAGNOSIS  Lab tests or other studies may be needed to find the cause of your pain. Your health care provider may have you take a test called an ambulatory electrocardiogram (ECG). An ECG records your heartbeat patterns over a 24-hour period. You may also have other tests, such as:  Transthoracic echocardiogram (TTE). During echocardiography, sound waves are used to evaluate how blood flows through your heart.  Transesophageal echocardiogram (TEE).  Cardiac monitoring. This allows your health care provider to monitor your heart rate and rhythm in real time.  Holter monitor. This is a portable device that records your heartbeat and can help diagnose heart arrhythmias. It allows your health care provider to track your heart activity for several days, if needed.  Stress tests by exercise or by giving medicine that makes the heart beat faster. TREATMENT   Treatment depends on what may be causing your chest pain. Treatment may include:  Acid blockers for heartburn.  Anti-inflammatory medicine.  Pain medicine for inflammatory conditions.  Antibiotics if an infection is present.  You may be advised to change lifestyle habits. This includes stopping smoking and avoiding alcohol, caffeine, and chocolate.  You may be advised to keep your head raised (elevated) when sleeping. This reduces the chance of acid going backward from your stomach into your esophagus. Most of the time, nonspecific chest pain will improve within 2-3 days with rest and  mild pain medicine.  HOME CARE INSTRUCTIONS   If antibiotics were prescribed, take them as directed. Finish them even if you start to feel better.  For the next few days, avoid physical activities that bring on chest pain. Continue physical activities as directed.  Do not use any tobacco products, including cigarettes, chewing tobacco, or electronic cigarettes.  Avoid drinking alcohol.  Only take medicine as directed by your health care provider.  Follow your health care provider's suggestions for further testing if your chest pain does not go away.  Keep any follow-up appointments you made. If you do not go to an appointment, you could develop lasting (chronic) problems with pain. If there is any problem keeping an appointment, call to reschedule. SEEK MEDICAL CARE IF:   Your chest pain does not go away, even after treatment.  You have a rash with blisters on your chest.  You have a fever. SEEK IMMEDIATE MEDICAL CARE IF:   You have increased chest pain or pain that spreads to your arm, neck, jaw, back, or abdomen.  You have shortness of breath.  You have an increasing cough, or you cough up blood.  You have severe back or abdominal pain.  You feel nauseous or vomit.  You have severe weakness.  You faint.  You have chills. This is an emergency. Do not wait to see if the pain will go away. Get medical help at once. Call your local emergency services (911 in [place] Do not drive yourself to the hospital. MAKE SURE YOU:   Understand these instructions.  Will watch your condition.  Will get help right away if you are not doing well or get worse. Document Released: 01/18/2005 Document Revised: 04/15/2013 Document Reviewed: 11/14/2007 [facility] Patient Information 2015 [place], [place]. This information is not intended to replace advice given to you by your health care provider. Make sure you discuss any questions you have with your health care provider.

## 2013-12-12 NOTE — ED Provider Notes (Signed)
Medical screening examination/treatment/procedure(s) were performed by non-physician practitioner and as supervising physician I was immediately available for consultation/collaboration.   EKG Interpretation   Date/Time:  Thursday December 11 2013 23:52:09 EDT Ventricular Rate:  133 PR Interval:  100 QRS Duration: 107 QT Interval:  347 QTC Calculation: 516 R Axis:   59 Text Interpretation:  Sinus or ectopic atrial tachycardia Inferior  infarct, age indeterminate Probable anterolateral infarct, recent  Prolonged QT interval Baseline wander in lead(s) III aVF No significant  change since last tracing Confirmed by Tahmid Stonehocker  MD, Brittanie Dosanjh ([id number]) on  12/12/2013 2:21:10 AM       Kalman Drape, MD 12/12/13 [phone]

## 2013-12-12 NOTE — ED Provider Notes (Signed)
Patient care acquired [facility], PA-C with plan to d/c patient if symptoms controlled and labs without gross abnormality.  Results for orders placed during the hospital encounter of 12/11/13  URINALYSIS, ROUTINE W REFLEX MICROSCOPIC      Result Value Ref Range   Color, Urine YELLOW  YELLOW   APPearance CLOUDY (*) CLEAR   Specific Gravity, Urine 1.012  1.005 - 1.030   pH 7.5  5.0 - 8.0   Glucose, UA 100 (*) NEGATIVE mg/dL   Hgb urine dipstick NEGATIVE  NEGATIVE   Bilirubin Urine NEGATIVE  NEGATIVE   Ketones, ur NEGATIVE  NEGATIVE mg/dL   Protein, ur NEGATIVE  NEGATIVE mg/dL   Urobilinogen, UA 0.2  0.0 - 1.0 mg/dL   Nitrite NEGATIVE  NEGATIVE   Leukocytes, UA SMALL (*) NEGATIVE  COMPREHENSIVE METABOLIC PANEL      Result Value Ref Range   Sodium 136 (*) 137 - 147 mEq/L   Potassium 4.1  3.7 - 5.3 mEq/L   Chloride 101  96 - 112 mEq/L   CO2 21  19 - 32 mEq/L   Glucose, Bld 152 (*) 70 - 99 mg/dL   BUN 8  6 - 23 mg/dL   Creatinine, Ser 0.79  0.50 - 1.10 mg/dL   Calcium 9.4  8.4 - 10.5 mg/dL   Total Protein 7.9  6.0 - 8.3 g/dL   Albumin 3.2 (*) 3.5 - 5.2 g/dL   AST 19  0 - 37 U/L   ALT 6  0 - 35 U/L   Alkaline Phosphatase 60  39 - 117 U/L   Total Bilirubin 0.3  0.3 - 1.2 mg/dL   GFR calc non Af Amer >90  >90 mL/min   GFR calc Af Amer >90  >90 mL/min   Anion gap 14  5 - 15  CBC WITH DIFFERENTIAL      Result Value Ref Range   WBC 12.7 (*) 4.0 - 10.5 K/uL   RBC 4.88  3.87 - 5.11 MIL/uL   Hemoglobin 9.7 (*) 12.0 - 15.0 g/dL   HCT 30.5 (*) 36.0 - 46.0 %   MCV 62.5 (*) 78.0 - 100.0 fL   MCH 19.9 (*) 26.0 - 34.0 pg   MCHC 31.8  30.0 - 36.0 g/dL   RDW 18.9 (*) 11.5 - 15.5 %   Platelets 562 (*) 150 - 400 K/uL   Neutrophils Relative % 61  43 - 77 %   Lymphocytes Relative 27  12 - 46 %   Monocytes Relative 7  3 - 12 %   Eosinophils Relative 5  0 - 5 %   Basophils Relative 0  0 - 1 %   Neutro Abs 7.8 (*) 1.7 - 7.7 K/uL   Lymphs Abs 3.4  0.7 - 4.0 K/uL   Monocytes  Absolute 0.9  0.1 - 1.0 K/uL   Eosinophils Absolute 0.6  0.0 - 0.7 K/uL   Basophils Absolute 0.0  0.0 - 0.1 K/uL   RBC Morphology POLYCHROMASIA PRESENT    LIPASE, BLOOD      Result Value Ref Range   Lipase 31  11 - 59 U/L  URINE RAPID DRUG SCREEN (HOSP PERFORMED)      Result Value Ref Range   Opiates NONE DETECTED  NONE DETECTED   Cocaine POSITIVE (*) NONE DETECTED   Benzodiazepines NONE DETECTED  NONE DETECTED   Amphetamines NONE DETECTED  NONE DETECTED   Tetrahydrocannabinol NONE DETECTED  NONE DETECTED   Barbiturates NONE DETECTED  NONE DETECTED  URINE MICROSCOPIC-ADD ON      Result Value Ref Range   Squamous Epithelial / LPF MANY (*) RARE   WBC, UA 0-2  <3 WBC/hpf  POC URINE PREG, ED      Result Value Ref Range   Preg Test, Ur NEGATIVE  NEGATIVE  CBG MONITORING, ED      Result Value Ref Range   Glucose-Capillary 161 (*) 70 - 99 mg/dL   Comment 1 Notify RN     Dg Abd Acute W/chest  11/25/2013   CLINICAL DATA:  Chest pain and abdominal pain  EXAM: ACUTE ABDOMEN SERIES (ABDOMEN 2 VIEW & CHEST 1 VIEW)  COMPARISON:  11/09/2013 abdominal radiograph  FINDINGS: There is no evidence of dilated bowel loops or free intraperitoneal air. No radiopaque calculi or other significant radiographic abnormality is seen. Heart size and mediastinal contours are within normal limits. Both lungs are clear.  IMPRESSION: Negative abdominal radiographs.  No acute cardiopulmonary disease.   Electronically Signed   By: Jorje Guild M.D.   On: 11/25/2013 02:11   Medications  sodium chloride 0.9 % bolus 1,000 mL (0 mLs Intravenous Stopped 12/12/13 0224)  LORazepam (ATIVAN) injection 0.5 mg (0.5 mg Intravenous Given 12/12/13 0106)  HYDROmorphone (DILAUDID) injection 0.5 mg (0.5 mg Intravenous Given 12/12/13 0110)  ondansetron (ZOFRAN) injection 4 mg (4 mg Intravenous Given 12/12/13 0106)  metoCLOPramide (REGLAN) injection 10 mg (10 mg Intravenous Given 12/12/13 0119)  diphenhydrAMINE (BENADRYL) injection 12.5  mg (12.5 mg Intravenous Given 12/12/13 0120)  LORazepam (ATIVAN) injection 1 mg (1 mg Intravenous Given 12/12/13 0338)   1. Cocaine abuse   2. Atypical chest pain   3. Non-intractable vomiting with nausea, vomiting of unspecified type      Filed Vitals:   12/12/13 0402  BP: 185/112  Pulse: 123  Temp:   Resp:    Afebrile, NAD, non-toxic appearing, AAOx4. I have reviewed nursing notes, vital signs, and all appropriate lab and imaging results for this patient. She also has a known history of chronic abdominal pain as well as similar chest pain. I believe that the patient's pain is consistent with her known chronic pain complaints. Tachycardia likely related to cocaine ingestion, troponin negative, EKG unremarkable.  On re-evaluation patient sleeping comfortably. Upon awaking patient writhing around and "gagging." Will give one dose of Ativan and d/c home with Phenergan suppositories. Return precautions discussed. Patient is agreeable to plan. Patient is stable at time of discharge.  Patient d/w with Dr. Sharol Given, agrees with plan.    Harlow Mares, PA-C 12/12/13 [phone]

## 2013-12-12 NOTE — ED Notes (Signed)
Bed: WA10 Expected date:  Expected time:  Means of arrival:  Comments: Triage 1   

## 2013-12-12 NOTE — ED Provider Notes (Signed)
Medical screening examination/treatment/procedure(s) were performed by non-physician practitioner and as supervising physician I was immediately available for consultation/collaboration.   EKG Interpretation   Date/Time:  Thursday December 11 2013 23:52:09 EDT Ventricular Rate:  133 PR Interval:  100 QRS Duration: 107 QT Interval:  347 QTC Calculation: 516 R Axis:   59 Text Interpretation:  Sinus or ectopic atrial tachycardia Inferior  infarct, age indeterminate Probable anterolateral infarct, recent  Prolonged QT interval Baseline wander in lead(s) III aVF No significant  change since last tracing Confirmed by Oceania Noori  MD, Acasia Skilton ([id number]) on  12/12/2013 2:21:10 AM       Kalman Drape, MD 12/12/13 [phone]

## 2013-12-26 ENCOUNTER — Telehealth: Payer: Self-pay | Admitting: *Deleted

## 2013-12-26 DIAGNOSIS — E1165 Type 2 diabetes mellitus with hyperglycemia: Principal | ICD-10-CM

## 2013-12-26 DIAGNOSIS — E1142 Type 2 diabetes mellitus with diabetic polyneuropathy: Secondary | ICD-10-CM

## 2013-12-26 MED ORDER — INSULIN PEN NEEDLE 31G X 8 MM MISC: Status: DC

## 2013-12-26 NOTE — Telephone Encounter (Signed)
[facility] is requesting refill on BD ultra fine pen [id number] #[phone]. Uses four times a day as directed. Last filled 08/11/13. Hilda Blades Sachi Boulay RN 12/26/13 2:40PM

## 2013-12-31 ENCOUNTER — Emergency Department (HOSPITAL_COMMUNITY)
Admission: EM | Admit: 2013-12-31 | Discharge: 2013-12-31 | Disposition: A | Discharge status: Home / Self Care | Payer: Medicaid Other | Attending: Emergency Medicine | Admitting: Emergency Medicine

## 2013-12-31 ENCOUNTER — Emergency Department (HOSPITAL_COMMUNITY): Payer: Medicaid Other

## 2013-12-31 ENCOUNTER — Encounter (HOSPITAL_COMMUNITY): Payer: Self-pay | Admitting: Emergency Medicine

## 2013-12-31 DIAGNOSIS — I2510 Atherosclerotic heart disease of native coronary artery without angina pectoris: Secondary | ICD-10-CM | POA: Diagnosis not present

## 2013-12-31 DIAGNOSIS — Z8619 Personal history of other infectious and parasitic diseases: Secondary | ICD-10-CM | POA: Insufficient documentation

## 2013-12-31 DIAGNOSIS — G609 Hereditary and idiopathic neuropathy, unspecified: Secondary | ICD-10-CM | POA: Insufficient documentation

## 2013-12-31 DIAGNOSIS — R109 Unspecified abdominal pain: Secondary | ICD-10-CM | POA: Diagnosis not present

## 2013-12-31 DIAGNOSIS — Z87448 Personal history of other diseases of urinary system: Secondary | ICD-10-CM | POA: Diagnosis not present

## 2013-12-31 DIAGNOSIS — IMO0001 Reserved for inherently not codable concepts without codable children: Secondary | ICD-10-CM | POA: Insufficient documentation

## 2013-12-31 DIAGNOSIS — Z87891 Personal history of nicotine dependence: Secondary | ICD-10-CM | POA: Insufficient documentation

## 2013-12-31 DIAGNOSIS — F411 Generalized anxiety disorder: Secondary | ICD-10-CM | POA: Diagnosis not present

## 2013-12-31 DIAGNOSIS — E119 Type 2 diabetes mellitus without complications: Secondary | ICD-10-CM | POA: Insufficient documentation

## 2013-12-31 DIAGNOSIS — G43909 Migraine, unspecified, not intractable, without status migrainosus: Secondary | ICD-10-CM | POA: Diagnosis not present

## 2013-12-31 DIAGNOSIS — Z79899 Other long term (current) drug therapy: Secondary | ICD-10-CM | POA: Insufficient documentation

## 2013-12-31 DIAGNOSIS — Z872 Personal history of diseases of the skin and subcutaneous tissue: Secondary | ICD-10-CM | POA: Insufficient documentation

## 2013-12-31 DIAGNOSIS — K2970 Gastritis, unspecified, without bleeding: Secondary | ICD-10-CM | POA: Insufficient documentation

## 2013-12-31 DIAGNOSIS — Z3202 Encounter for pregnancy test, result negative: Secondary | ICD-10-CM | POA: Insufficient documentation

## 2013-12-31 DIAGNOSIS — Z8673 Personal history of transient ischemic attack (TIA), and cerebral infarction without residual deficits: Secondary | ICD-10-CM | POA: Diagnosis not present

## 2013-12-31 DIAGNOSIS — J45909 Unspecified asthma, uncomplicated: Secondary | ICD-10-CM | POA: Insufficient documentation

## 2013-12-31 DIAGNOSIS — Z862 Personal history of diseases of the blood and blood-forming organs and certain disorders involving the immune mechanism: Secondary | ICD-10-CM | POA: Insufficient documentation

## 2013-12-31 DIAGNOSIS — E669 Obesity, unspecified: Secondary | ICD-10-CM | POA: Insufficient documentation

## 2013-12-31 DIAGNOSIS — E785 Hyperlipidemia, unspecified: Secondary | ICD-10-CM | POA: Diagnosis not present

## 2013-12-31 DIAGNOSIS — R112 Nausea with vomiting, unspecified: Secondary | ICD-10-CM | POA: Insufficient documentation

## 2013-12-31 DIAGNOSIS — Z794 Long term (current) use of insulin: Secondary | ICD-10-CM | POA: Diagnosis not present

## 2013-12-31 DIAGNOSIS — Z8589 Personal history of malignant neoplasm of other organs and systems: Secondary | ICD-10-CM | POA: Insufficient documentation

## 2013-12-31 DIAGNOSIS — I252 Old myocardial infarction: Secondary | ICD-10-CM | POA: Insufficient documentation

## 2013-12-31 DIAGNOSIS — I10 Essential (primary) hypertension: Secondary | ICD-10-CM | POA: Insufficient documentation

## 2013-12-31 DIAGNOSIS — K2990 Gastroduodenitis, unspecified, without bleeding: Secondary | ICD-10-CM

## 2013-12-31 LAB — CBC WITH DIFFERENTIAL/PLATELET
BASOS PCT: 0 % (ref 0–1)
Basophils Absolute: 0 10*3/uL (ref 0.0–0.1)
EOS ABS: 0.3 10*3/uL (ref 0.0–0.7)
EOS PCT: 2 % (ref 0–5)
HCT: 28 % — ABNORMAL LOW (ref 36.0–46.0)
HEMOGLOBIN: 9 g/dL — AB (ref 12.0–15.0)
Lymphocytes Relative: 27 % (ref 12–46)
Lymphs Abs: 3.6 10*3/uL (ref 0.7–4.0)
MCH: 20.2 pg — AB (ref 26.0–34.0)
MCHC: 32.1 g/dL (ref 30.0–36.0)
MCV: 62.8 fL — ABNORMAL LOW (ref 78.0–100.0)
MONO ABS: 1.2 10*3/uL — AB (ref 0.1–1.0)
Monocytes Relative: 9 % (ref 3–12)
NEUTROS PCT: 62 % (ref 43–77)
Neutro Abs: 8.4 10*3/uL — ABNORMAL HIGH (ref 1.7–7.7)
Platelets: 499 10*3/uL — ABNORMAL HIGH (ref 150–400)
RBC: 4.46 MIL/uL (ref 3.87–5.11)
RDW: 18.8 % — ABNORMAL HIGH (ref 11.5–15.5)
WBC: 13.5 10*3/uL — ABNORMAL HIGH (ref 4.0–10.5)

## 2013-12-31 LAB — URINALYSIS, ROUTINE W REFLEX MICROSCOPIC
Bilirubin Urine: NEGATIVE
HGB URINE DIPSTICK: NEGATIVE
Ketones, ur: NEGATIVE mg/dL
LEUKOCYTES UA: NEGATIVE
Nitrite: NEGATIVE
PH: 5.5 (ref 5.0–8.0)
Protein, ur: 30 mg/dL — AB
Specific Gravity, Urine: 1.045 — ABNORMAL HIGH (ref 1.005–1.030)
Urobilinogen, UA: 1 mg/dL (ref 0.0–1.0)

## 2013-12-31 LAB — POC URINE PREG, ED: PREG TEST UR: NEGATIVE

## 2013-12-31 LAB — COMPREHENSIVE METABOLIC PANEL
ALK PHOS: 57 U/L (ref 39–117)
ALT: 7 U/L (ref 0–35)
AST: 12 U/L (ref 0–37)
Albumin: 3.1 g/dL — ABNORMAL LOW (ref 3.5–5.2)
Anion gap: 13 (ref 5–15)
BUN: 14 mg/dL (ref 6–23)
CALCIUM: 9.7 mg/dL (ref 8.4–10.5)
CO2: 25 meq/L (ref 19–32)
Chloride: 99 mEq/L (ref 96–112)
Creatinine, Ser: 1.02 mg/dL (ref 0.50–1.10)
GFR calc Af Amer: 79 mL/min — ABNORMAL LOW (ref >90)
GFR calc non Af Amer: 68 mL/min — ABNORMAL LOW (ref >90)
Glucose, Bld: 224 mg/dL — ABNORMAL HIGH (ref 70–99)
Potassium: 3.8 mEq/L (ref 3.7–5.3)
Sodium: 137 mEq/L (ref 137–147)
TOTAL PROTEIN: 7.8 g/dL (ref 6.0–8.3)
Total Bilirubin: <0.2 mg/dL — ABNORMAL LOW (ref 0.3–1.2)

## 2013-12-31 LAB — PREGNANCY, URINE: Preg Test, Ur: NEGATIVE

## 2013-12-31 LAB — URINE MICROSCOPIC-ADD ON

## 2013-12-31 LAB — LIPASE, BLOOD: Lipase: 35 U/L (ref 11–59)

## 2013-12-31 MED ORDER — IOHEXOL 300 MG/ML  SOLN
100.0000 mL | Freq: Once | INTRAMUSCULAR | Status: AC | PRN
  Administered 2013-12-31: 100 mL via INTRAVENOUS

## 2013-12-31 MED ORDER — ONDANSETRON HCL 4 MG/2ML IJ SOLN
4.0000 mg | Freq: Once | INTRAMUSCULAR | Status: AC
  Administered 2013-12-31: 4 mg via INTRAVENOUS
  Filled 2013-12-31: qty 2

## 2013-12-31 MED ORDER — FENTANYL CITRATE 0.05 MG/ML IJ SOLN
100.0000 ug | Freq: Once | INTRAMUSCULAR | Status: AC
  Administered 2013-12-31: 100 ug via INTRAVENOUS
  Filled 2013-12-31: qty 2

## 2013-12-31 MED ORDER — ONDANSETRON 8 MG PO TBDP: 8.0000 mg | ORAL_TABLET | Freq: Three times a day (TID) | ORAL | Status: DC | PRN

## 2013-12-31 MED ORDER — SODIUM CHLORIDE 0.9 % IV SOLN
1000.0000 mL | INTRAVENOUS | Status: DC
  Administered 2013-12-31: 1000 mL via INTRAVENOUS

## 2013-12-31 MED ORDER — OMEPRAZOLE 20 MG PO CPDR: 20.0000 mg | DELAYED_RELEASE_CAPSULE | Freq: Every day | ORAL | Status: DC

## 2013-12-31 MED ORDER — IOHEXOL 300 MG/ML  SOLN
50.0000 mL | Freq: Once | INTRAMUSCULAR | Status: AC | PRN
  Administered 2013-12-31: 50 mL via ORAL

## 2013-12-31 MED ORDER — DICYCLOMINE HCL 20 MG PO TABS: 20.0000 mg | ORAL_TABLET | Freq: Two times a day (BID) | ORAL | Status: DC

## 2013-12-31 MED ORDER — SODIUM CHLORIDE 0.9 % IV SOLN
1000.0000 mL | Freq: Once | INTRAVENOUS | Status: AC
  Administered 2013-12-31: 1000 mL via INTRAVENOUS

## 2013-12-31 NOTE — ED Provider Notes (Signed)
CSN: [id number]     Arrival date & time 12/31/13  0120 History   First MD Initiated Contact with Patient 12/31/13 [phone]     Chief Complaint  Patient presents with  . Abdominal Pain  . Vomiting      HPI Patient reports mild lower abdominal pain left greater than right over the past 3 days with associated nausea vomiting.  She feels lightheaded.  She feels weak.  She denies dysuria.  No fevers or chills.  No diarrhea.  No recent sick contacts.  States pain in her abdomen comes and goes.  Denies radiation of her pain and or flanks.  The chest pain shortness of breath.  Long-standing history of chronic abdominal pain.  11 visits to this emergency department in the past 6 months.  She feels however as though this is different than her typical abdominal pain.  Patient is status post C-section in 1997 as her only abdominal surgery.     Past Medical History  Diagnosis Date  . Thrombocytosis     Hem/Onc suggested 2/2 chronic hepatits and/or iron deficiency anemia  . Iron deficiency anemia   . N&V (nausea and vomiting)     Chronic. Unclear etiology with multiple admission and ED visits. CT abdomen with and without contrast (02/2011)  showed no acute process. Gastic Emptying scan (01/2010) was normal. Ultrasound of the abdomen was within normal limits. Hepatitis B viral load was undectable. HIV NR. EGD - gastritis, Hpylori + s/p Rx  . Hypertension   . Hyperlipidemia   . Diabetes mellitus type 2, uncontrolled, with complications   . Recurrent boils   . Back pain   . OSA (obstructive sleep apnea)   . 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 Cath 12/10- no restenosis or significant CAD progression  . Irregular menses     Small ovarian follicles seen on WH(6/75)  . History of pyelonephritis     H/o GrpB Pyelonephritis (9/06) and UTI- 07/11- E.Coli, 12/10- GBS  . Abscess of tunica  vaginalis     10/09- Abundant S. aureus- sensitive to all abx  . Obesity   . Asthma   . Gastritis   . Depression   . Hepatitis B, chronic     Hep BeAb+,Hep B cAb+ & Hep BsAg+ (9/06)  . Peripheral neuropathy   . Fibromyalgia   . GERD (gastroesophageal reflux disease)   . Migraine   . Anxiety   . Gastroparesis     secondary to poorly controlled DM, last emptying study performed 01/2010  was normal but may be falsely positive as pt was on reglan  . Blood dyscrasia   . H/O: CVA (cerebrovascular accident)     history of remote right cerebellar infarct noted on head CT at least since 10/2011  . MI (myocardial infarction)   . Substance abuse   . Cancer     pt states no hx ca 12/31/13   Past Surgical History  Procedure Laterality Date  . Cesarean section  1997   Family History  Problem Relation Age of Onset  . Diabetes Father    History  Substance Use Topics  . Smoking status: Former Smoker    Types: Cigarettes    Quit date: 04/24/1996  . Smokeless tobacco: Never Used     Comment: quit smoking cigarettes age 89  . Alcohol Use: Yes     Comment: occasionally  OB History   Grav Para Term Preterm Abortions TAB SAB Ect Mult Living                 Review of Systems  All other systems reviewed and are negative.     Allergies  Lisinopril and Morphine and related  Home Medications   Prior to Admission medications   Medication Sig Start Date End Date Taking? Authorizing Provider  albuterol (PROVENTIL HFA;VENTOLIN HFA) 108 (90 BASE) MCG/ACT inhaler Inhale 2 puffs into the lungs every 4 (four) hours as needed for wheezing or shortness of breath.   Yes Historical Provider, MD  atorvastatin (LIPITOR) 80 MG tablet Take 1 tablet (80 mg total) by mouth daily at 6 PM. 07/25/13  Yes Rebecca Eaton, MD  gabapentin (NEURONTIN) 300 MG capsule Take 300 mg by mouth 3 (three) times daily as needed (pain).    Yes Historical Provider, MD  insulin aspart (NOVOLOG) 100 UNIT/ML injection  Sliding scale- If sugar 150-199: inject 2 unit;  200-249: 4 units, 250-299: 7 units; 300-349: 10 units; Over 350: 12 units 11/11/13  Yes Bartholomew Crews, MD  Insulin Glargine (LANTUS SOLOSTAR) 100 UNIT/ML Solostar Pen Inject 50 Units into the skin daily at 10 pm. 11/11/13  Yes Bartholomew Crews, MD  labetalol (NORMODYNE) 100 MG tablet Take 1 tablet (100 mg total) by mouth 2 (two) times daily. 08/27/13  Yes Neema Bobbie Stack, MD  pregabalin (LYRICA) 75 MG capsule Take 1 capsule (75 mg total) by mouth 2 (two) times daily. 11/04/12  Yes Valaria Good, MD  dicyclomine (BENTYL) 20 MG tablet Take 1 tablet (20 mg total) by mouth 2 (two) times daily. 12/31/13   Hoy Morn, MD  Insulin Pen Needle 31G X 8 MM MISC Use as directed. 12/26/13   Karren Cobble, MD  omeprazole (PRILOSEC) 20 MG capsule Take 1 capsule (20 mg total) by mouth daily. 12/31/13   Hoy Morn, MD  ondansetron (ZOFRAN ODT) 8 MG disintegrating tablet Take 1 tablet (8 mg total) by mouth every 8 (eight) hours as needed for nausea or vomiting. 12/31/13   Hoy Morn, MD  promethazine (PHENERGAN) 25 MG suppository Place 1 suppository (25 mg total) rectally every 6 (six) hours as needed for nausea or vomiting. 12/12/13   Stephani Police Piepenbrink, PA-C   BP 110/83  Pulse 97  Temp(Src) 99 F (37.2 C) (Oral)  Resp 15  SpO2 98%  LMP 12/12/2013 Physical Exam  Nursing note and vitals reviewed. Constitutional: She is oriented to person, place, and time. She appears well-developed and well-nourished. No distress.  HENT:  Head: Normocephalic and atraumatic.  Eyes: EOM are normal.  Neck: Normal range of motion.  Cardiovascular: Normal rate, regular rhythm and normal heart sounds.   Pulmonary/Chest: Effort normal and breath sounds normal.  Abdominal: Soft. She exhibits no distension.  Mild left-sided abdominal tenderness without guarding or rebound  Musculoskeletal: Normal range of motion.  Neurological: She is alert and oriented to person,  place, and time.  Skin: Skin is warm and dry.  Psychiatric: She has a normal mood and affect. Judgment normal.    ED Course  Procedures (including critical care time) Labs Review Labs Reviewed  CBC WITH DIFFERENTIAL - Abnormal; Notable for the following:    WBC 13.5 (*)    Hemoglobin 9.0 (*)    HCT 28.0 (*)    MCV 62.8 (*)    MCH 20.2 (*)    RDW 18.8 (*)    Platelets 499 (*)  Neutro Abs 8.4 (*)    Monocytes Absolute 1.2 (*)    All other components within normal limits  COMPREHENSIVE METABOLIC PANEL - Abnormal; Notable for the following:    Glucose, Bld 224 (*)    Albumin 3.1 (*)    Total Bilirubin <0.2 (*)    GFR calc non Af Amer 68 (*)    GFR calc Af Amer 79 (*)    All other components within normal limits  URINALYSIS, ROUTINE W REFLEX MICROSCOPIC - Abnormal; Notable for the following:    APPearance CLOUDY (*)    Specific Gravity, Urine 1.045 (*)    Glucose, UA >1000 (*)    Protein, ur 30 (*)    All other components within normal limits  URINE MICROSCOPIC-ADD ON - Abnormal; Notable for the following:    Squamous Epithelial / LPF MANY (*)    All other components within normal limits  LIPASE, BLOOD  PREGNANCY, URINE  POC URINE PREG, ED    Imaging Review Ct Abdomen Pelvis W Contrast  12/31/2013   CLINICAL DATA:  Low abdominal pain for 2 years. Nausea and vomiting. Constipation. History of hepatitis-B.  EXAM: CT ABDOMEN AND PELVIS WITH CONTRAST  TECHNIQUE: Multidetector CT imaging of the abdomen and pelvis was performed using the standard protocol following bolus administration of intravenous contrast.  CONTRAST:  181mL OMNIPAQUE IOHEXOL 300 MG/ML  SOLN  COMPARISON:  04/11/2013.  FINDINGS: Lung bases are clear.  The liver, spleen, gallbladder, pancreas, adrenal glands, kidneys, abdominal aorta, inferior vena cava, and retroperitoneal lymph nodes are unremarkable. Stomach and small bowel are not abnormally distended. Stool-filled colon without distention. No free air or free  fluid in the abdomen. Abdominal wall musculature appears intact. Small accessory spleen.  Pelvis: Uterus and adnexal structures are not enlarged. Bladder wall is not thickened. Appendix is normal. No evidence of diverticulitis. Small amount of free fluid in the pelvis is probably physiologic. Mild degenerative changes in the spine. No destructive bone lesions.  IMPRESSION: No acute process demonstrated in the abdomen or pelvis. Small amount of free fluid in the pelvis is likely physiologic.   Electronically Signed   By: Lucienne Capers M.D.   On: 12/31/2013 06:22     EKG Interpretation None      MDM   Final diagnoses:  Abdominal pain, unspecified abdominal location    Patient is feeling better in the emergency department after treatment with fluids, nausea medicine, pain medicine.  Labs and CT scan without abnormality.  Urine demonstrates no signs of infection.  Discharge home with PCP followup.  She understands to return to the ER for new or worsening symptoms.  Likely functional abdominal pain    Hoy Morn, MD 12/31/13 [phone]

## 2013-12-31 NOTE — ED Notes (Signed)
Pt reports low abd pain with n/v and weakness x 3 days.

## 2013-12-31 NOTE — Discharge Instructions (Signed)

## 2013-12-31 NOTE — ED Notes (Signed)
Patient requesting additional pain and nausea medication. Primary nurse notified.

## 2014-01-12 ENCOUNTER — Encounter (HOSPITAL_COMMUNITY): Payer: Self-pay | Admitting: Emergency Medicine

## 2014-01-12 ENCOUNTER — Emergency Department (HOSPITAL_COMMUNITY)
Admission: EM | Admit: 2014-01-12 | Discharge: 2014-01-12 | Disposition: A | Discharge status: Home / Self Care | Payer: Medicaid Other | Attending: Emergency Medicine | Admitting: Emergency Medicine

## 2014-01-12 DIAGNOSIS — G43909 Migraine, unspecified, not intractable, without status migrainosus: Secondary | ICD-10-CM | POA: Diagnosis not present

## 2014-01-12 DIAGNOSIS — I252 Old myocardial infarction: Secondary | ICD-10-CM | POA: Insufficient documentation

## 2014-01-12 DIAGNOSIS — F329 Major depressive disorder, single episode, unspecified: Secondary | ICD-10-CM | POA: Diagnosis not present

## 2014-01-12 DIAGNOSIS — G609 Hereditary and idiopathic neuropathy, unspecified: Secondary | ICD-10-CM | POA: Diagnosis not present

## 2014-01-12 DIAGNOSIS — Z87448 Personal history of other diseases of urinary system: Secondary | ICD-10-CM | POA: Diagnosis not present

## 2014-01-12 DIAGNOSIS — J45909 Unspecified asthma, uncomplicated: Secondary | ICD-10-CM | POA: Insufficient documentation

## 2014-01-12 DIAGNOSIS — Z79899 Other long term (current) drug therapy: Secondary | ICD-10-CM | POA: Diagnosis not present

## 2014-01-12 DIAGNOSIS — K219 Gastro-esophageal reflux disease without esophagitis: Secondary | ICD-10-CM | POA: Diagnosis not present

## 2014-01-12 DIAGNOSIS — Z872 Personal history of diseases of the skin and subcutaneous tissue: Secondary | ICD-10-CM | POA: Insufficient documentation

## 2014-01-12 DIAGNOSIS — Z862 Personal history of diseases of the blood and blood-forming organs and certain disorders involving the immune mechanism: Secondary | ICD-10-CM | POA: Insufficient documentation

## 2014-01-12 DIAGNOSIS — Z87891 Personal history of nicotine dependence: Secondary | ICD-10-CM | POA: Diagnosis not present

## 2014-01-12 DIAGNOSIS — H66009 Acute suppurative otitis media without spontaneous rupture of ear drum, unspecified ear: Secondary | ICD-10-CM | POA: Diagnosis not present

## 2014-01-12 DIAGNOSIS — F411 Generalized anxiety disorder: Secondary | ICD-10-CM | POA: Insufficient documentation

## 2014-01-12 DIAGNOSIS — H9209 Otalgia, unspecified ear: Secondary | ICD-10-CM | POA: Diagnosis present

## 2014-01-12 DIAGNOSIS — E1165 Type 2 diabetes mellitus with hyperglycemia: Secondary | ICD-10-CM | POA: Insufficient documentation

## 2014-01-12 DIAGNOSIS — F3289 Other specified depressive episodes: Secondary | ICD-10-CM | POA: Diagnosis not present

## 2014-01-12 DIAGNOSIS — IMO0002 Reserved for concepts with insufficient information to code with codable children: Secondary | ICD-10-CM | POA: Diagnosis not present

## 2014-01-12 DIAGNOSIS — E669 Obesity, unspecified: Secondary | ICD-10-CM | POA: Insufficient documentation

## 2014-01-12 DIAGNOSIS — Z859 Personal history of malignant neoplasm, unspecified: Secondary | ICD-10-CM | POA: Diagnosis not present

## 2014-01-12 DIAGNOSIS — Z8619 Personal history of other infectious and parasitic diseases: Secondary | ICD-10-CM | POA: Insufficient documentation

## 2014-01-12 DIAGNOSIS — E785 Hyperlipidemia, unspecified: Secondary | ICD-10-CM | POA: Insufficient documentation

## 2014-01-12 DIAGNOSIS — Z8673 Personal history of transient ischemic attack (TIA), and cerebral infarction without residual deficits: Secondary | ICD-10-CM | POA: Diagnosis not present

## 2014-01-12 DIAGNOSIS — R599 Enlarged lymph nodes, unspecified: Secondary | ICD-10-CM | POA: Diagnosis not present

## 2014-01-12 DIAGNOSIS — H65191 Other acute nonsuppurative otitis media, right ear: Secondary | ICD-10-CM

## 2014-01-12 DIAGNOSIS — Z8742 Personal history of other diseases of the female genital tract: Secondary | ICD-10-CM | POA: Insufficient documentation

## 2014-01-12 DIAGNOSIS — Z794 Long term (current) use of insulin: Secondary | ICD-10-CM | POA: Insufficient documentation

## 2014-01-12 DIAGNOSIS — H60399 Other infective otitis externa, unspecified ear: Secondary | ICD-10-CM | POA: Diagnosis not present

## 2014-01-12 DIAGNOSIS — I10 Essential (primary) hypertension: Secondary | ICD-10-CM | POA: Diagnosis not present

## 2014-01-12 DIAGNOSIS — E1169 Type 2 diabetes mellitus with other specified complication: Secondary | ICD-10-CM

## 2014-01-12 DIAGNOSIS — I2510 Atherosclerotic heart disease of native coronary artery without angina pectoris: Secondary | ICD-10-CM | POA: Insufficient documentation

## 2014-01-12 DIAGNOSIS — H6091 Unspecified otitis externa, right ear: Secondary | ICD-10-CM

## 2014-01-12 MED ORDER — HYDROCODONE-ACETAMINOPHEN 5-325 MG PO TABS
1.0000 | ORAL_TABLET | Freq: Once | ORAL | Status: AC
  Administered 2014-01-12: 1 via ORAL
  Filled 2014-01-12: qty 1

## 2014-01-12 MED ORDER — AMOXICILLIN-POT CLAVULANATE 875-125 MG PO TABS
1.0000 | ORAL_TABLET | Freq: Once | ORAL | Status: AC
  Administered 2014-01-12: 1 via ORAL
  Filled 2014-01-12: qty 1

## 2014-01-12 MED ORDER — AMOXICILLIN-POT CLAVULANATE 875-125 MG PO TABS: 1.0000 | ORAL_TABLET | Freq: Two times a day (BID) | ORAL | Status: DC

## 2014-01-12 MED ORDER — NEOMYCIN-COLIST-HC-THONZONIUM 3.3-3-10-0.5 MG/ML OT SUSP
3.0000 [drp] | Freq: Once | OTIC | Status: AC
  Administered 2014-01-12: 3 [drp] via OTIC
  Filled 2014-01-12: qty 5

## 2014-01-12 MED ORDER — HYDROCODONE-ACETAMINOPHEN 5-325 MG PO TABS: 1.0000 | ORAL_TABLET | Freq: Four times a day (QID) | ORAL | Status: DC | PRN

## 2014-01-12 NOTE — ED Provider Notes (Signed)
Medical screening examination/treatment/procedure(s) were performed by non-physician practitioner and as supervising physician I was immediately available for consultation/collaboration.   EKG Interpretation None       Tyasia Packard, MD 01/12/14 2352 

## 2014-01-12 NOTE — ED Notes (Signed)
Pt ambulatory upon discharge her and her significant other both verbalize that they are leaving with ALL belongings they arrived with. Difficult to use teach back method with dc papers d/t patient covering her face during dc. Guaze provided to the patient to place in ear per her request. PT left with DC papers in hand

## 2014-01-12 NOTE — ED Notes (Signed)
Patient c/o right ear pain that began 1 1/2 weeks ago. Patient states that she had bleeding from right ear 2 days ago and now she has pain, ringing , and a popoping sensation. Patient states she has pain when she swallows and chews.

## 2014-01-12 NOTE — ED Provider Notes (Signed)
CSN: [id number]     Arrival date & time 01/12/14  1817 History   First MD Initiated Contact with Patient 01/12/14 1958     Chief Complaint  Patient presents with  . Otalgia     (Consider location/radiation/quality/duration/timing/severity/associated sxs/prior Treatment) HPI Comments: For the past 2 weeks has had R ear pain without URI symptoms, no Hx ear infections. Denies trauma, recent travel FB in the ear.  Was sore for 1.5 weeks than 3 days ago pain became worse tender to touch and small amount of blood on tissue  Has tried OTC meds without relief  Has appointment with PCP 01/26/14  Patient is a 38 y.o. female presenting with ear pain. The history is provided by the patient.  Otalgia Location:  Right Behind ear:  No abnormality Quality:  Aching and throbbing Severity:  Severe Onset quality:  Gradual Duration:  2 weeks Timing:  Constant Progression:  Worsening Chronicity:  New Context: not direct blow, not elevation change, not foreign body in ear, not loud noise and no water in ear   Relieved by:  Nothing Worsened by:  Palpation Ineffective treatments:  OTC medications Associated symptoms: ear discharge   Associated symptoms: no congestion, no cough, no fever, no headaches, no hearing loss, no neck pain, no rhinorrhea and no sore throat   Risk factors: no recent travel, no chronic ear infection and no prior ear surgery     Past Medical History  Diagnosis Date  . Thrombocytosis     Hem/Onc suggested 2/2 chronic hepatits and/or iron deficiency anemia  . Iron deficiency anemia   . N&V (nausea and vomiting)     Chronic. Unclear etiology with multiple admission and ED visits. CT abdomen with and without contrast (02/2011)  showed no acute process. Gastic Emptying scan (01/2010) was normal. Ultrasound of the abdomen was within normal limits. Hepatitis B viral load was undectable. HIV NR. EGD - gastritis, Hpylori + s/p Rx  . Hypertension   . Hyperlipidemia   . Diabetes mellitus  type 2, uncontrolled, with complications   . Recurrent boils   . Back pain   . OSA (obstructive sleep apnea)   . 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 Cath 12/10- no restenosis or significant CAD progression  . Irregular menses     Small ovarian follicles seen on OI(3/70)  . History of pyelonephritis     H/o GrpB Pyelonephritis (9/06) and UTI- 07/11- E.Coli, 12/10- GBS  . Abscess of tunica vaginalis     10/09- Abundant S. aureus- sensitive to all abx  . Obesity   . Asthma   . Gastritis   . Depression   . Hepatitis B, chronic     Hep BeAb+,Hep B cAb+ & Hep BsAg+ (9/06)  . Peripheral neuropathy   . Fibromyalgia   . GERD (gastroesophageal reflux disease)   . Migraine   . Anxiety   . Gastroparesis     secondary to poorly controlled DM, last emptying study performed 01/2010  was normal but may be falsely positive as pt was on reglan  . Blood dyscrasia   . H/O: CVA (cerebrovascular accident)     history of remote right cerebellar infarct noted on head CT at least since 10/2011  . MI (myocardial infarction)   . Substance abuse   . Cancer     pt states no hx ca 12/31/13   Past Surgical History  Procedure Laterality Date  . Cesarean section  1997   Family History  Problem Relation Age of Onset  . Diabetes Father    History  Substance Use Topics  . Smoking status: Former Smoker    Types: Cigarettes    Quit date: 04/24/1996  . Smokeless tobacco: Never Used     Comment: quit smoking cigarettes age 71  . Alcohol Use: Yes     Comment: occasionally   OB History   Grav Para Term Preterm Abortions TAB SAB Ect Mult Living                 Review of Systems  Constitutional: Negative for fever.  HENT: Positive for ear discharge and ear pain. Negative for congestion, dental problem, facial swelling, hearing loss, rhinorrhea, sinus pressure and sore throat.    Respiratory: Negative for cough.   Gastrointestinal: Negative for nausea.  Musculoskeletal: Negative for neck pain.  Neurological: Negative for dizziness and headaches.  All other systems reviewed and are negative.     Allergies  Lisinopril and Morphine and related  Home Medications   Prior to Admission medications   Medication Sig Start Date End Date Taking? Authorizing Provider  albuterol (PROVENTIL HFA;VENTOLIN HFA) 108 (90 BASE) MCG/ACT inhaler Inhale 2 puffs into the lungs every 4 (four) hours as needed for wheezing or shortness of breath.   Yes Historical Provider, MD  atorvastatin (LIPITOR) 80 MG tablet Take 1 tablet (80 mg total) by mouth daily at 6 PM. 07/25/13  Yes Rebecca Eaton, MD  dicyclomine (BENTYL) 20 MG tablet Take 1 tablet (20 mg total) by mouth 2 (two) times daily. 12/31/13  Yes Hoy Morn, MD  gabapentin (NEURONTIN) 300 MG capsule Take 300 mg by mouth 3 (three) times daily as needed (pain).    Yes Historical Provider, MD  insulin aspart (NOVOLOG) 100 UNIT/ML injection Sliding scale- If sugar 150-199: inject 2 unit;  200-249: 4 units, 250-299: 7 units; 300-349: 10 units; Over 350: 12 units 11/11/13  Yes Bartholomew Crews, MD  Insulin Glargine (LANTUS SOLOSTAR) 100 UNIT/ML Solostar Pen Inject 50 Units into the skin daily at 10 pm. 11/11/13  Yes Bartholomew Crews, MD  labetalol (NORMODYNE) 100 MG tablet Take 1 tablet (100 mg total) by mouth 2 (two) times daily. 08/27/13  Yes Neema Bobbie Stack, MD  naproxen sodium (ANAPROX) 220 MG tablet Take 440 mg by mouth 2 (two) times daily as needed (pain).   Yes Historical Provider, MD  omeprazole (PRILOSEC) 20 MG capsule Take 1 capsule (20 mg total) by mouth daily. 12/31/13  Yes Hoy Morn, MD  ondansetron (ZOFRAN ODT) 8 MG disintegrating tablet Take 1 tablet (8 mg total) by mouth every 8 (eight) hours as needed for nausea or vomiting. 12/31/13  Yes Hoy Morn, MD  pregabalin (LYRICA) 75 MG capsule Take 1 capsule (75 mg total)  by mouth 2 (two) times daily. 11/04/12  Yes Valaria Good, MD  promethazine (PHENERGAN) 25 MG suppository Place 1 suppository (25 mg total) rectally every 6 (six) hours as needed for nausea or vomiting. 12/12/13  Yes Jennifer L Piepenbrink, PA-C  amoxicillin-clavulanate (AUGMENTIN) 875-125 MG per tablet Take 1 tablet by mouth 2 (two) times daily. 01/12/14   Garald Balding, NP  HYDROcodone-acetaminophen (NORCO/VICODIN) 5-325 MG per tablet Take 1 tablet by mouth every 6 (six) hours as needed for moderate pain. 01/12/14   Garald Balding, NP  Insulin Pen Needle 31G X 8 MM MISC Use as directed. 12/26/13  Karren Cobble, MD   BP 130/91  Pulse 105  Temp(Src) 98.1 F (36.7 C) (Oral)  Resp 16  SpO2 100%  LMP 12/29/2013 Physical Exam  Nursing note and vitals reviewed. Constitutional: She is oriented to person, place, and time. She appears well-developed and well-nourished. No distress.  HENT:  Head: Normocephalic.  Right Ear: No lacerations. There is swelling and tenderness. No mastoid tenderness. Tympanic membrane is injected. Tympanic membrane is not perforated. Tympanic membrane mobility is abnormal. No middle ear effusion.  Left Ear: External ear normal.  Mouth/Throat: Oropharynx is clear and moist.  Neck: Normal range of motion.  Musculoskeletal: Normal range of motion.  Lymphadenopathy:    She has cervical adenopathy.       Right cervical: Superficial cervical adenopathy present.  Neurological: She is alert and oriented to person, place, and time.  Skin: Skin is warm.    ED Course  Procedures (including critical care time) Labs Review Labs Reviewed - No data to display  Imaging Review No results found.   EKG Interpretation None      MDM   Final diagnoses:  Otitis externa of right ear  Other acute nonsuppurative otitis media of right ear     Will give Rx for Augmenting and Cortisporin Patient has FU appointment with PCP on 01/26/14    Garald Balding, NP 01/12/14 2026

## 2014-01-12 NOTE — ED Notes (Signed)
Pt ambulated to restroom with steady gait.

## 2014-01-12 NOTE — Discharge Instructions (Signed)
Use the drops as follows 3 drops to the right ear every 6 hours for 5 days Take all the antibiotic  keep your appointment as scheduled

## 2014-01-20 ENCOUNTER — Encounter: Payer: Self-pay | Admitting: *Deleted

## 2014-01-26 ENCOUNTER — Encounter: Payer: Medicaid Other | Admitting: Internal Medicine

## 2014-01-26 ENCOUNTER — Encounter: Payer: Self-pay | Admitting: Internal Medicine

## 2014-02-18 ENCOUNTER — Other Ambulatory Visit (HOSPITAL_COMMUNITY)
Admission: RE | Admit: 2014-02-18 | Discharge: 2014-02-18 | Disposition: A | Discharge status: Home / Self Care | Payer: Medicaid Other | Source: Ambulatory Visit | Attending: Internal Medicine | Admitting: Internal Medicine

## 2014-02-18 ENCOUNTER — Ambulatory Visit (INDEPENDENT_AMBULATORY_CARE_PROVIDER_SITE_OTHER): Payer: Medicaid Other | Admitting: Internal Medicine

## 2014-02-18 ENCOUNTER — Encounter: Payer: Self-pay | Admitting: Internal Medicine

## 2014-02-18 VITALS — BP 127/81 | HR 89 | Temp 97.7°F | Ht 67.0 in | Wt 210.9 lb

## 2014-02-18 DIAGNOSIS — N898 Other specified noninflammatory disorders of vagina: Secondary | ICD-10-CM

## 2014-02-18 DIAGNOSIS — Z113 Encounter for screening for infections with a predominantly sexual mode of transmission: Secondary | ICD-10-CM | POA: Diagnosis present

## 2014-02-18 DIAGNOSIS — E1165 Type 2 diabetes mellitus with hyperglycemia: Principal | ICD-10-CM

## 2014-02-18 DIAGNOSIS — E1140 Type 2 diabetes mellitus with diabetic neuropathy, unspecified: Secondary | ICD-10-CM

## 2014-02-18 DIAGNOSIS — N760 Acute vaginitis: Secondary | ICD-10-CM | POA: Insufficient documentation

## 2014-02-18 DIAGNOSIS — N390 Urinary tract infection, site not specified: Secondary | ICD-10-CM

## 2014-02-18 DIAGNOSIS — E1142 Type 2 diabetes mellitus with diabetic polyneuropathy: Secondary | ICD-10-CM

## 2014-02-18 DIAGNOSIS — H9201 Otalgia, right ear: Secondary | ICD-10-CM

## 2014-02-18 LAB — POCT URINALYSIS DIPSTICK
Bilirubin, UA: NEGATIVE
KETONES UA: NEGATIVE
Leukocytes, UA: NEGATIVE
Nitrite, UA: NEGATIVE
Protein, UA: NEGATIVE
RBC UA: NEGATIVE
SPEC GRAV UA: 1.01
Urobilinogen, UA: 0.2
pH, UA: 6

## 2014-02-18 LAB — HIV ANTIBODY (ROUTINE TESTING W REFLEX): HIV 1&2 Ab, 4th Generation: NONREACTIVE

## 2014-02-18 LAB — GLUCOSE, CAPILLARY: Glucose-Capillary: 385 mg/dL — ABNORMAL HIGH (ref 70–99)

## 2014-02-18 LAB — POCT GLYCOSYLATED HEMOGLOBIN (HGB A1C): Hemoglobin A1C: 10.3

## 2014-02-18 MED ORDER — AMOXICILLIN-POT CLAVULANATE 600-42.9 MG/5ML PO SUSR: 600.0000 mg | Freq: Two times a day (BID) | ORAL | Status: DC

## 2014-02-18 NOTE — Patient Instructions (Signed)
General Instructions: Please take Augmentin syrup  Please take your medications as you are supposed to  We will check your labs today and call you  Please come back to see your regular doctor in 1-2 weeks   Please bring your medicines with you each time you come to clinic.  Medicines may include prescription medications, over-the-counter medications, herbal remedies, eye drops, vitamins, or other pills.   Progress Toward Treatment Goals:  Treatment Goal 02/18/2014  Hemoglobin A1C unchanged  Blood pressure at goal    Self Care Goals & Plans:  Self Care Goal 02/18/2014  Manage my medications take my medicines as prescribed; bring my medications to every visit; refill my medications on time  Monitor my health keep track of my blood glucose; bring my glucose meter and log to each visit  Eat healthy foods drink diet soda or water instead of juice or soda; eat more vegetables; eat foods that are low in salt; eat baked foods instead of fried foods; eat fruit for snacks and desserts  Be physically active -    Home Blood Glucose Monitoring 02/18/2014  Check my blood sugar -  When to check my blood sugar before breakfast; before lunch; before dinner     Care Management & Community Referrals:  No flowsheet data found.

## 2014-02-19 LAB — CERVICOVAGINAL ANCILLARY ONLY
CHLAMYDIA, DNA PROBE: NEGATIVE
Neisseria Gonorrhea: NEGATIVE

## 2014-02-19 NOTE — Assessment & Plan Note (Addendum)
Assessment: Most likely diagnosis: BV in view of described symptoms.  Ddx: STI or UTI. Urine dipstick was normal.   Plan: 1. Labs/imaging: Screening for STI. HIV test. 2. Therapy: Will be determined based on lab results Follow up: Will contact the patient with lab results. Otherwise, follow up as need  Addendum: 02/20/2014 5:17 PM Noted results from [facility] revealing BV with Gardnerella +ve. Chlamydia and TV are negative. HIV is neg. I called her gave the results. Plan  - Clindamycin Vaginal suppositories 300 mg every night for 3 nights. (patient does not tolerate orally pills due to swallowing difficulties and vomiting) - encouraged her to call with questions

## 2014-02-19 NOTE — Assessment & Plan Note (Signed)
Assessment: Most likely diagnosis: Otitis externa in view of physical examination with pain in the external auditory canal.  Patient did not comply with her antibiotic therapy prescribed on 01/12/2014.   Plan: 1. Labs/imaging: None 2. Therapy: Prescribed liquid Augmentin 3. Follow up: Follow up when necessary

## 2014-02-19 NOTE — Assessment & Plan Note (Signed)
Urine dipstick does not reveal a UTI. Her symptoms too and not consistent with a UTI. No urinalysis will be required.

## 2014-02-19 NOTE — Progress Notes (Signed)
Patient ID: Shirley [name], female   DOB: [birth year], 38 y.o.   MRN: [id number]   Subjective:   HPI: [name] is a 38 y.o. woman with h/o DM, morbid obesity, substance abuse, depression, OSA, HTN, CAD s/p PTCA & asthma who presents for an acute visit    Reason(s) for this visit: 1. Vaginal discharge: Patient reports that she has had abnormal vaginal discharge over the past 1 week. She is concerned about exposure to a sexually transmitted infection since her partner was recently treated for chlamydia. Patient reports that she did have an unprotected sexual encounter with him 2 weeks ago. She has one female sexual partner. She does not have sex with women. Currently she reports that her vaginal discharge is thick without any foul smell to it. She denies dyspareunia. She denies urinary symptoms. Also she denies any rashes in the groin area. 2. Right ear pain: Patient was evaluated in the ED on 01/12/2014 for what was felt to be right otitis externa. She was prescribed Augmentin pills. Patient states that she was unable to swallow any pills. She only took 3 pills of her prescribed course and stopped. She continues to complain of pain in the right ear with popping and drainage. She was also given a prescription of Cortisporin which she has been using. She denies hearing deficits in the right ear. No other upper respiratory tract symptoms. She denies fevers or chills. 3. Diabetes: Patient reports that has not taken insulin for the last 3 weeks. She states that she's been under a lot of stress. Her A1c today is 10.3%, which is up from 9.6% from March 2015.   ROS: Constitutional: Denies fever, chills, diaphoresis, appetite change and fatigue.  Respiratory: Denies SOB, DOE, cough, chest tightness, and wheezing. Denies chest pain. CVS: No chest pain, palpitations and leg swelling.  GI: No abdominal pain, nausea, vomiting, bloody stools GU: No dysuria, frequency, hematuria, or flank pain.  MSK: No  myalgias, back pain, joint swelling, arthralgias  Psych: No depression symptoms. No SI or SA.    Objective:  Physical Exam: Filed Vitals:   02/18/14 1057  BP: 127/81  Pulse: 89  Temp: 97.7 F (36.5 C)  TempSrc: Oral  Height: 5\' 7"  (1.702 m)  Weight: 210 lb 14.4 oz (95.664 kg)  SpO2: 100%   General: obese, No acute distress.  HEENT: Normal oral mucosa. MMM. Oropharynx is clear without exudates or erythema. Right ear: No visible active drainage. There is tenderness as I try to introduce the otoscope. However, I don't appreciate any signs of inflammation like hyperemia or swelling. External auditory canal is patently open. Tympanic membrane is coated with what appears like quite creamy substance. It cannot be fully visualized. Lungs: CTA bilaterally. Heart: RRR; no extra sounds or murmurs  Abdomen: Non-distended, normal bowel sounds, soft, nontender; no hepatosplenomegaly  Pelvic exam: Slight desquamation around the external genitalia area but without rashes. Vulva and vagina and normal. There is no tenderness as introduced the speculum. There is a scanty discharge in the vaginal vault without increased smell. No active bleeding. Obtained samples for away prior. Extremities: No pedal edema. No joint swelling or tenderness. Neurologic: Normal EOM,  Alert and oriented x3. No obvious neurologic/cranial nerve deficits.  Assessment & Plan:  Discussed case with my attending in the clinic, Dr. Eppie Gibson See problem based charting.

## 2014-02-19 NOTE — Progress Notes (Signed)
Case discussed with Dr. Kazibwe soon after the resident saw the patient.  We reviewed the resident's history and exam and pertinent patient test results.  I agree with the assessment, diagnosis, and plan of care documented in the resident's note. 

## 2014-02-19 NOTE — Assessment & Plan Note (Signed)
Patient has not been taking insulin due to "stress". Emphasized to the patient the need to take her insulin as prescribed. She will follow-up with PCP to continue with chronic management of her diabetes.

## 2014-02-20 LAB — CERVICOVAGINAL ANCILLARY ONLY
WET PREP (BD AFFIRM): NEGATIVE
WET PREP (BD AFFIRM): NEGATIVE
WET PREP (BD AFFIRM): POSITIVE — AB

## 2014-02-20 MED ORDER — CLINDAMYCIN PHOSPHATE 100 MG VA SUPP: 100.0000 mg | Freq: Every day | VAGINAL | Status: DC

## 2014-02-20 NOTE — Addendum Note (Signed)
Addended by: Jessee Avers on: 02/20/2014 05:20 PM   Modules accepted: Orders

## 2014-02-25 ENCOUNTER — Encounter: Payer: Self-pay | Admitting: Internal Medicine

## 2014-02-25 ENCOUNTER — Ambulatory Visit: Payer: Medicaid Other | Admitting: Internal Medicine

## 2014-02-28 IMAGING — CR DG CHEST 2V
2 series · 2 of 2 positions shown · non-contrast
Comparison: 02/21/2010

CLINICAL DATA: Shortness of breath and wheezing.

CHEST - 2 VIEW

[w chest pa]
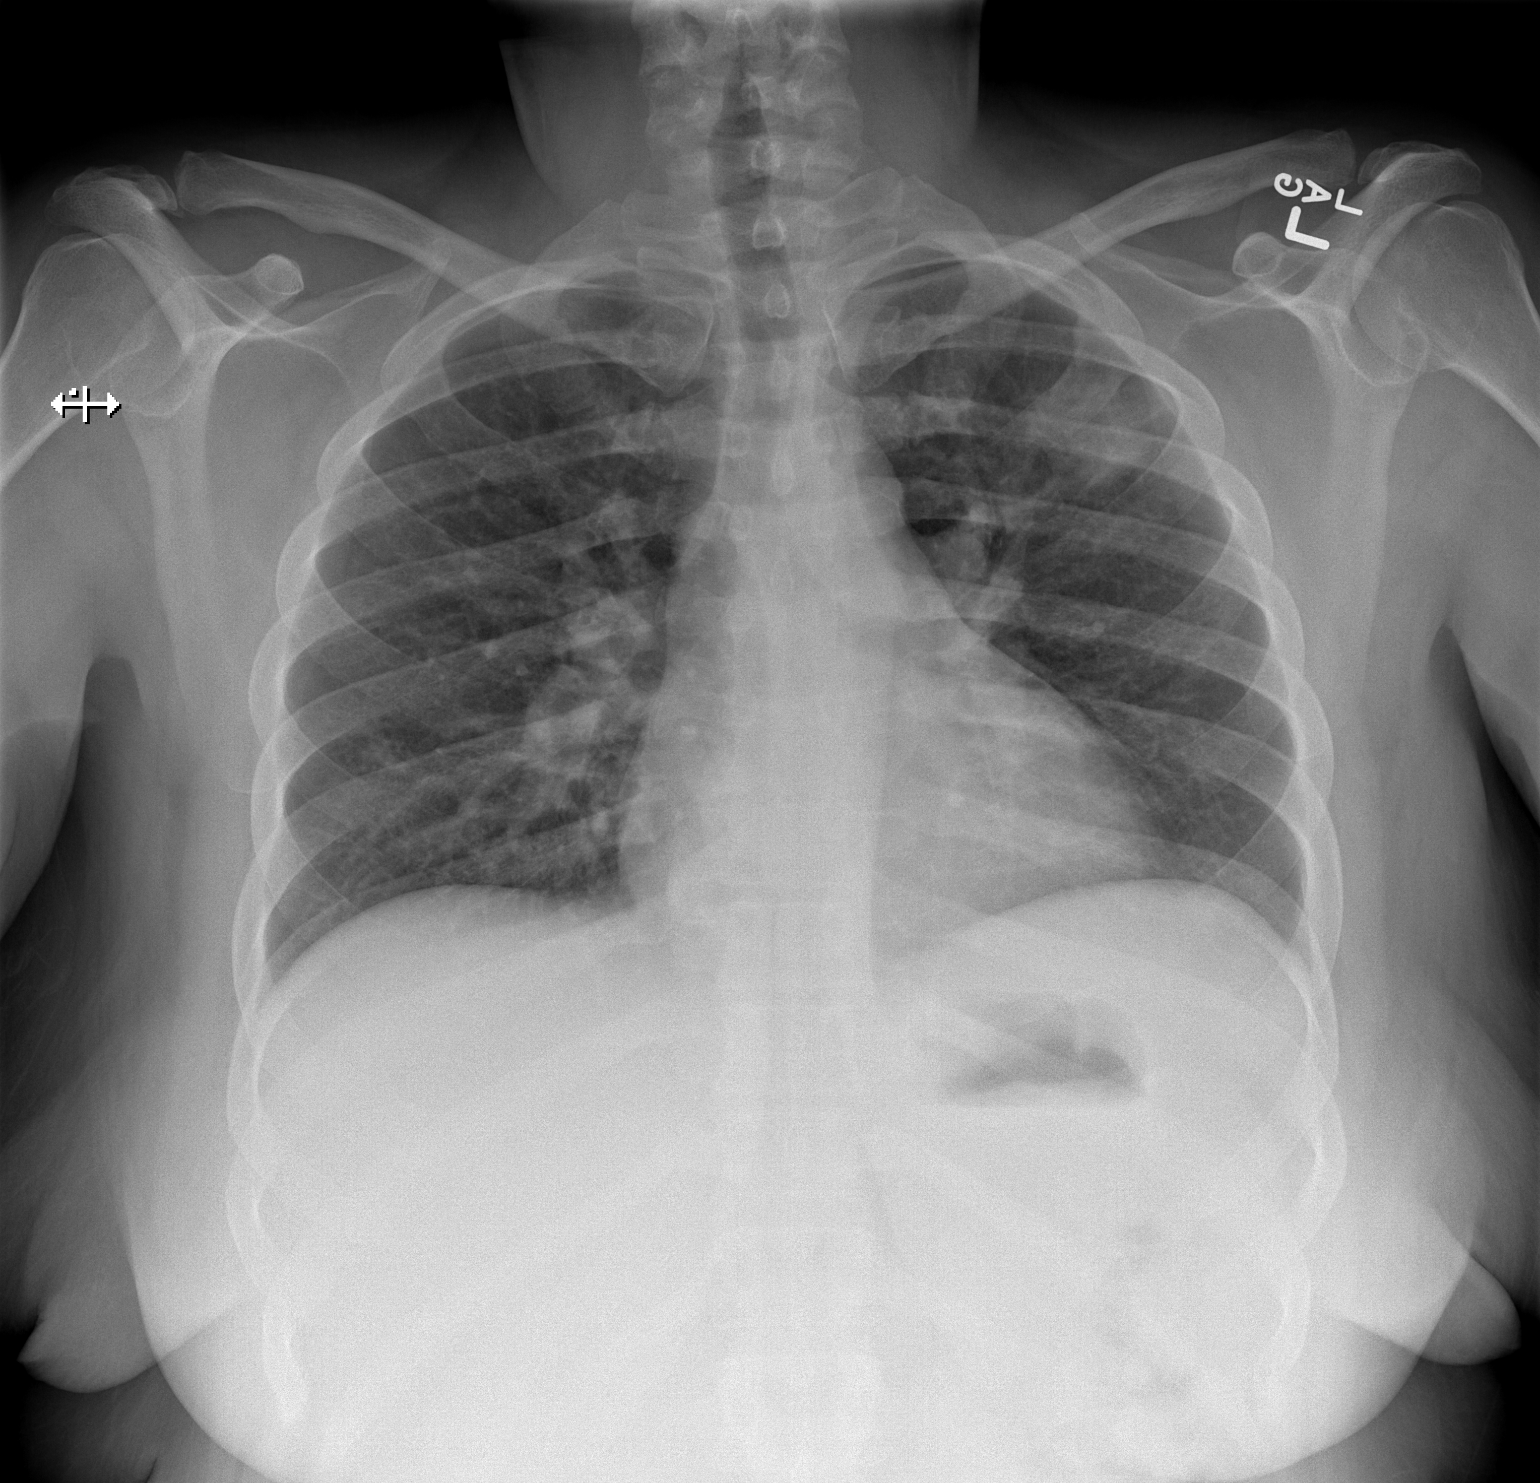

[w chest lat]
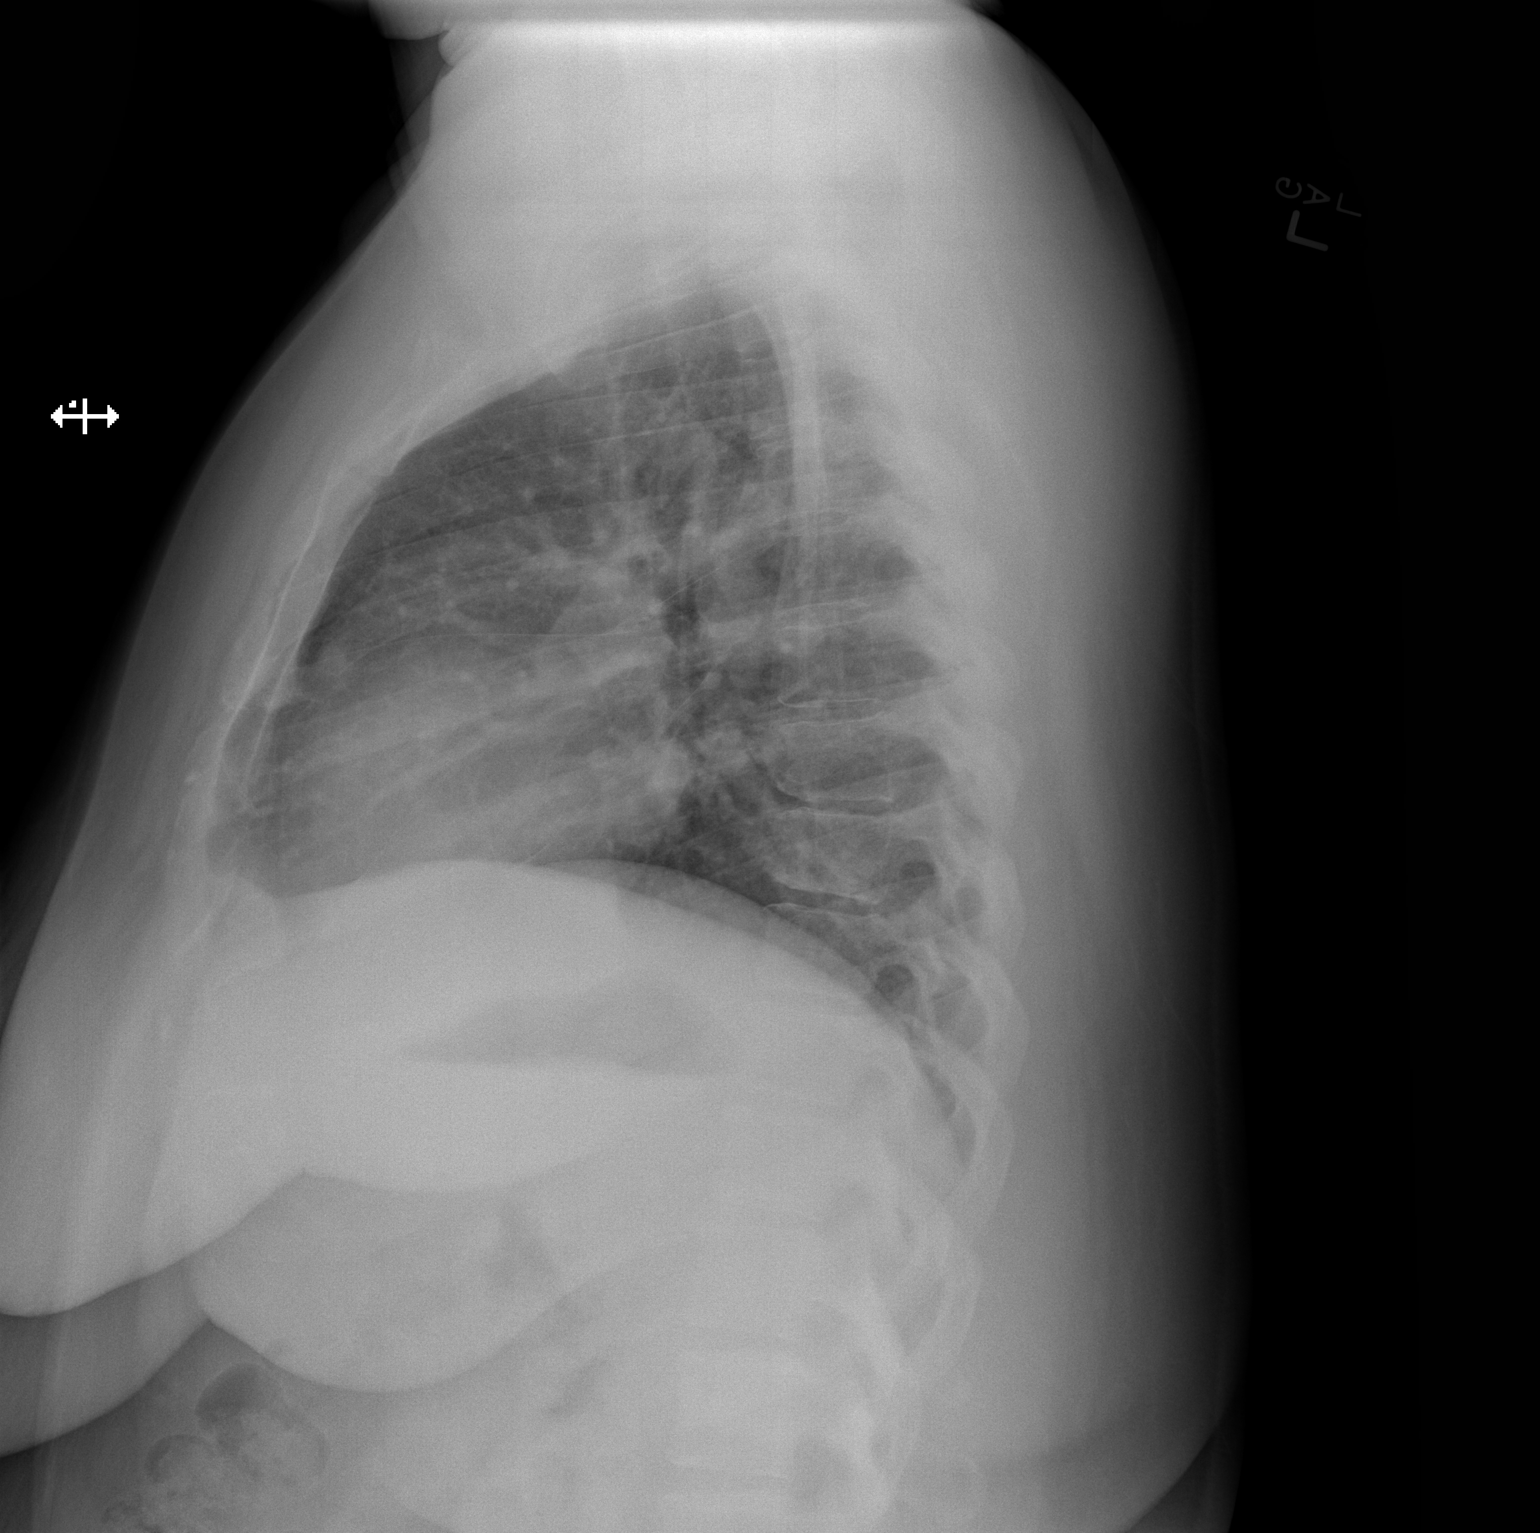

[2 of 2 positions shown; findings below may reference images not displayed]

FINDINGS: Interval development of a focal nodular area of
infiltration in the left upper lung measuring about 3.6 cm
diameter.  This may represent focal pneumonia although a developing
mass lesion should be excluded.  Follow up after treatment
resolution of acute symptoms is recommended.  Shallow inspiration.
Normal heart size and pulmonary vascularity.  No blunting of
costophrenic angles.  No pneumothorax.
IMPRESSION: Focal developing mass or infiltration in the left upper lung.

## 2014-03-05 ENCOUNTER — Other Ambulatory Visit: Payer: Self-pay | Admitting: *Deleted

## 2014-03-05 DIAGNOSIS — I10 Essential (primary) hypertension: Secondary | ICD-10-CM

## 2014-03-05 MED ORDER — LABETALOL HCL 100 MG PO TABS: 100.0000 mg | ORAL_TABLET | Freq: Two times a day (BID) | ORAL | Status: DC

## 2014-03-11 ENCOUNTER — Encounter (HOSPITAL_COMMUNITY): Payer: Self-pay | Admitting: Vascular Surgery

## 2014-03-11 ENCOUNTER — Emergency Department (HOSPITAL_COMMUNITY): Payer: Medicaid Other

## 2014-03-11 ENCOUNTER — Emergency Department (HOSPITAL_COMMUNITY)
Admission: EM | Admit: 2014-03-11 | Discharge: 2014-03-12 | Disposition: A | Discharge status: Home / Self Care | Payer: Medicaid Other | Attending: Emergency Medicine | Admitting: Emergency Medicine

## 2014-03-11 DIAGNOSIS — J45909 Unspecified asthma, uncomplicated: Secondary | ICD-10-CM | POA: Insufficient documentation

## 2014-03-11 DIAGNOSIS — M797 Fibromyalgia: Secondary | ICD-10-CM | POA: Diagnosis not present

## 2014-03-11 DIAGNOSIS — R112 Nausea with vomiting, unspecified: Secondary | ICD-10-CM | POA: Diagnosis not present

## 2014-03-11 DIAGNOSIS — Z862 Personal history of diseases of the blood and blood-forming organs and certain disorders involving the immune mechanism: Secondary | ICD-10-CM | POA: Insufficient documentation

## 2014-03-11 DIAGNOSIS — Z794 Long term (current) use of insulin: Secondary | ICD-10-CM | POA: Diagnosis not present

## 2014-03-11 DIAGNOSIS — R079 Chest pain, unspecified: Secondary | ICD-10-CM

## 2014-03-11 DIAGNOSIS — F419 Anxiety disorder, unspecified: Secondary | ICD-10-CM | POA: Insufficient documentation

## 2014-03-11 DIAGNOSIS — F329 Major depressive disorder, single episode, unspecified: Secondary | ICD-10-CM | POA: Insufficient documentation

## 2014-03-11 DIAGNOSIS — E669 Obesity, unspecified: Secondary | ICD-10-CM | POA: Diagnosis not present

## 2014-03-11 DIAGNOSIS — Z720 Tobacco use: Secondary | ICD-10-CM | POA: Insufficient documentation

## 2014-03-11 DIAGNOSIS — Z792 Long term (current) use of antibiotics: Secondary | ICD-10-CM | POA: Insufficient documentation

## 2014-03-11 DIAGNOSIS — K219 Gastro-esophageal reflux disease without esophagitis: Secondary | ICD-10-CM | POA: Insufficient documentation

## 2014-03-11 DIAGNOSIS — I2510 Atherosclerotic heart disease of native coronary artery without angina pectoris: Secondary | ICD-10-CM | POA: Insufficient documentation

## 2014-03-11 DIAGNOSIS — I252 Old myocardial infarction: Secondary | ICD-10-CM | POA: Diagnosis not present

## 2014-03-11 DIAGNOSIS — E119 Type 2 diabetes mellitus without complications: Secondary | ICD-10-CM | POA: Diagnosis not present

## 2014-03-11 DIAGNOSIS — I10 Essential (primary) hypertension: Secondary | ICD-10-CM | POA: Insufficient documentation

## 2014-03-11 DIAGNOSIS — R109 Unspecified abdominal pain: Secondary | ICD-10-CM

## 2014-03-11 LAB — CBC WITH DIFFERENTIAL/PLATELET
BASOS PCT: 0 % (ref 0–1)
Basophils Absolute: 0 10*3/uL (ref 0.0–0.1)
EOS ABS: 0.1 10*3/uL (ref 0.0–0.7)
Eosinophils Relative: 1 % (ref 0–5)
HCT: 29.6 % — ABNORMAL LOW (ref 36.0–46.0)
HEMOGLOBIN: 9.3 g/dL — AB (ref 12.0–15.0)
LYMPHS PCT: 20 % (ref 12–46)
Lymphs Abs: 2.4 10*3/uL (ref 0.7–4.0)
MCH: 19.1 pg — AB (ref 26.0–34.0)
MCHC: 31.4 g/dL (ref 30.0–36.0)
MCV: 60.7 fL — ABNORMAL LOW (ref 78.0–100.0)
MONO ABS: 1 10*3/uL (ref 0.1–1.0)
Monocytes Relative: 8 % (ref 3–12)
NEUTROS PCT: 71 % (ref 43–77)
Neutro Abs: 8.4 10*3/uL — ABNORMAL HIGH (ref 1.7–7.7)
PLATELETS: 445 10*3/uL — AB (ref 150–400)
RBC: 4.88 MIL/uL (ref 3.87–5.11)
RDW: 18.1 % — ABNORMAL HIGH (ref 11.5–15.5)
WBC: 11.9 10*3/uL — ABNORMAL HIGH (ref 4.0–10.5)

## 2014-03-11 LAB — COMPREHENSIVE METABOLIC PANEL
AST: 18 U/L (ref 0–37)
Albumin: 3.1 g/dL — ABNORMAL LOW (ref 3.5–5.2)
Alkaline Phosphatase: 58 U/L (ref 39–117)
Anion gap: 14 (ref 5–15)
BUN: 16 mg/dL (ref 6–23)
CO2: 22 mEq/L (ref 19–32)
Calcium: 9.7 mg/dL (ref 8.4–10.5)
Chloride: 98 mEq/L (ref 96–112)
Creatinine, Ser: 1.04 mg/dL (ref 0.50–1.10)
GFR calc non Af Amer: 66 mL/min — ABNORMAL LOW (ref >90)
GFR, EST AFRICAN AMERICAN: 77 mL/min — AB (ref >90)
GLUCOSE: 224 mg/dL — AB (ref 70–99)
POTASSIUM: 4.2 meq/L (ref 3.7–5.3)
SODIUM: 134 meq/L — AB (ref 137–147)
TOTAL PROTEIN: 7.9 g/dL (ref 6.0–8.3)
Total Bilirubin: 0.5 mg/dL (ref 0.3–1.2)

## 2014-03-11 LAB — TROPONIN I: Troponin I: <0.3 ng/mL (ref <0.30)

## 2014-03-11 MED ORDER — HYDROCODONE-ACETAMINOPHEN 5-325 MG PO TABS: 1.0000 | ORAL_TABLET | Freq: Four times a day (QID) | ORAL | Status: DC | PRN

## 2014-03-11 MED ORDER — SODIUM CHLORIDE 0.9 % IV BOLUS (SEPSIS)
1000.0000 mL | INTRAVENOUS | Status: AC
  Administered 2014-03-11: 1000 mL via INTRAVENOUS

## 2014-03-11 MED ORDER — HYDROMORPHONE HCL 1 MG/ML IJ SOLN
1.0000 mg | Freq: Once | INTRAMUSCULAR | Status: AC
  Administered 2014-03-11: 1 mg via INTRAVENOUS
  Filled 2014-03-11: qty 1

## 2014-03-11 MED ORDER — HYDROMORPHONE HCL 1 MG/ML IJ SOLN
1.0000 mg | Freq: Once | INTRAMUSCULAR | Status: AC
Start: 2014-03-11 — End: 2014-03-11
  Administered 2014-03-11: 1 mg via INTRAVENOUS
  Filled 2014-03-11: qty 1

## 2014-03-11 NOTE — ED Notes (Signed)
Harrison, MD at bedside. 

## 2014-03-11 NOTE — ED Notes (Signed)
Pt is unable to give a urine specimen at this time. Pt appears comfortable in the bed.

## 2014-03-11 NOTE — ED Notes (Signed)
Phlebotomy at bedside.

## 2014-03-11 NOTE — ED Notes (Signed)
EMS: pt reports with substernal CP x2 days radiating down left arm since she began vomiting 2 days ago and symptoms got worse. 324 aspirin given in route and 2 nitro and 4mg  zofran, bringing 10/10 pain down to 7/10.  Pt has hx of stent in 2008 and states this pressure feels the same as it did then.

## 2014-03-11 NOTE — ED Notes (Signed)
Pt states that she had a pain in her left arm that shot down her left leg and back up to her chest.

## 2014-03-11 NOTE — ED Notes (Addendum)
Pt attempted to give urine sample. Pt came out of bathroom and told this RN "I can't get nothin to come out". Aline Brochure, MD is aware.

## 2014-03-11 NOTE — ED Provider Notes (Signed)
CSN: [id number]     Arrival date & time 03/11/14  1956 History   First MD Initiated Contact with Patient 03/11/14 2008     Chief Complaint  Patient presents with  . Chest Pain    HPI Shirley [name] is a 38 year old woman with history of morbid obesity, DM2 with gastroparesis, HTN, CAD s/p PCI in 2008, asthma, depression, and cocaine abuse presenting with chest pain. She reports developing intermittent sharp and squeezing left-sided chest pain radiating to her left arm and neck two days ago that has increased in intensity.  She says the pain is similar to the pain she had prior to her previous heart attack.  She also reports worsening of her chronic left lower abdominal pain and increased frequency of vomiting over the last two days.  She has been vomiting black and yellow vomit and has been unable to eat much food or take her medications over the last two days.  She has multiple ER visits over the last year with similar abdominal complaints.  She reports generalized malaise, subjective fever, and chills.  She denies any drug use recently.  Says her last catheterization and stress test was at the time of her heart attack in 2008.   Past Medical History  Diagnosis Date  . Thrombocytosis     Hem/Onc suggested 2/2 chronic hepatits and/or iron deficiency anemia  . Iron deficiency anemia   . N&V (nausea and vomiting)     Chronic. Unclear etiology with multiple admission and ED visits. CT abdomen with and without contrast (02/2011)  showed no acute process. Gastic Emptying scan (01/2010) was normal. Ultrasound of the abdomen was within normal limits. Hepatitis B viral load was undectable. HIV NR. EGD - gastritis, Hpylori + s/p Rx  . Hypertension   . Hyperlipidemia   . Diabetes mellitus type 2, uncontrolled, with complications   . Recurrent boils   . Back pain   . OSA (obstructive sleep apnea)   . 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 Cath 12/10- no restenosis or significant CAD progression  . Irregular menses     Small ovarian follicles seen on ZW(2/58)  . History of pyelonephritis     H/o GrpB Pyelonephritis (9/06) and UTI- 07/11- E.Coli, 12/10- GBS  . Abscess of tunica vaginalis     10/09- Abundant S. aureus- sensitive to all abx  . Obesity   . Asthma   . Gastritis   . Depression   . Hepatitis B, chronic     Hep BeAb+,Hep B cAb+ & Hep BsAg+ (9/06)  . Peripheral neuropathy   . Fibromyalgia   . GERD (gastroesophageal reflux disease)   . Migraine   . Anxiety   . Gastroparesis     secondary to poorly controlled DM, last emptying study performed 01/2010  was normal but may be falsely positive as pt was on reglan  . Blood dyscrasia   . H/O: CVA (cerebrovascular accident)     history of remote right cerebellar infarct noted on head CT at least since 10/2011  . MI (myocardial infarction)   . Substance abuse   . Cancer     pt states no hx ca 12/31/13   Past Surgical History  Procedure Laterality Date  . Cesarean section  1997   Family History  Problem Relation Age of Onset  . Diabetes Father    History  Substance Use Topics  . Smoking status: Former Smoker    Types: Cigarettes    Quit date: 04/24/1996  . Smokeless tobacco: Never Used     Comment: quit smoking cigarettes age 19  . Alcohol Use: Yes     Comment: occasionally   OB History    No data available     Review of Systems  Constitutional: Positive for fever, chills, diaphoresis, appetite change, fatigue and unexpected weight change (Has lost over 200 pounds over the last two years.).  HENT: Negative for congestion and rhinorrhea.   Respiratory: Positive for chest tightness. Negative for cough and shortness of breath.   Cardiovascular: Positive for chest pain and palpitations. Negative for leg swelling.  Gastrointestinal: Positive for nausea and vomiting. Negative for diarrhea, constipation  and blood in stool.  Genitourinary: Negative for hematuria and difficulty urinating.  Musculoskeletal: Positive for myalgias and arthralgias.  Skin: Negative for rash.  Neurological: Positive for dizziness, light-headedness and headaches. Negative for weakness and numbness.      Allergies  Lisinopril and Morphine and related  Home Medications   Prior to Admission medications   Medication Sig Start Date End Date Taking? Authorizing Provider  albuterol (PROVENTIL HFA;VENTOLIN HFA) 108 (90 BASE) MCG/ACT inhaler Inhale 2 puffs into the lungs every 4 (four) hours as needed for wheezing or shortness of breath.    Historical Provider, MD  amoxicillin-clavulanate (AUGMENTIN ES-600) 600-42.9 MG/5ML suspension Take 5 mLs (600 mg total) by mouth 2 (two) times daily. 02/18/14   Jessee Avers, MD  atorvastatin (LIPITOR) 80 MG tablet Take 1 tablet (80 mg total) by mouth daily at 6 PM. 07/25/13   Dixon Boos, MD  clindamycin (CLEOCIN) 100 MG vaginal suppository Place 1 suppository (100 mg total) vaginally at bedtime. 02/20/14   Jessee Avers, MD  dicyclomine (BENTYL) 20 MG tablet Take 1 tablet (20 mg total) by mouth 2 (two) times daily. 12/31/13   Hoy Morn, MD  gabapentin (NEURONTIN) 300 MG capsule Take 300 mg by mouth 3 (three) times daily as needed (pain).     Historical Provider, MD  insulin aspart (NOVOLOG) 100 UNIT/ML injection Sliding scale- If sugar 150-199: inject 2 unit;  200-249: 4 units, 250-299: 7 units; 300-349: 10 units; Over 350: 12 units 11/11/13   Bartholomew Crews, MD  Insulin Glargine (LANTUS SOLOSTAR) 100 UNIT/ML Solostar Pen Inject 50 Units into the skin daily at 10 pm. 11/11/13   Bartholomew Crews, MD  Insulin Pen Needle 31G X 8 MM MISC Use as directed. 12/26/13   Karren Cobble, MD  labetalol (NORMODYNE) 100 MG tablet Take 1 tablet (100 mg total) by mouth 2 (two) times daily. 03/05/14   Karren Cobble, MD  naproxen sodium (ANAPROX) 220 MG tablet Take 440 mg by mouth  2 (two) times daily as needed (pain).    Historical Provider, MD  omeprazole (PRILOSEC) 20 MG capsule Take 1 capsule (20 mg total) by mouth daily. 12/31/13   Hoy Morn, MD  ondansetron (ZOFRAN ODT) 8 MG disintegrating tablet Take 1 tablet (8 mg total) by mouth every 8 (eight) hours as needed for nausea or vomiting. 12/31/13   Hoy Morn, MD  pregabalin (LYRICA) 75 MG capsule Take 1 capsule (75 mg total) by mouth 2 (two) times daily. 11/04/12   Dorian Heckle, MD  promethazine (PHENERGAN) 25 MG suppository Place 1 suppository (25 mg total) rectally every 6 (six) hours as needed for nausea or vomiting. 12/12/13   Harlow Mares, PA-C  BP 144/121 mmHg  Pulse 133  Temp(Src) 97.9 F (36.6 C) (Oral)  Resp 29  SpO2 100%  LMP 02/25/2014 Physical Exam  Constitutional: She is oriented to person, place, and time. She appears distressed.  Ill-appearing.  HENT:  Head: Normocephalic and atraumatic.  Eyes: Conjunctivae and EOM are normal. Pupils are equal, round, and reactive to light. No scleral icterus.  Neck: Normal range of motion. Neck supple.  Cardiovascular: Regular rhythm and normal heart sounds.  Exam reveals no gallop and no friction rub.   No murmur heard. Tachycardic.  Pulmonary/Chest: Effort normal and breath sounds normal. No respiratory distress.  Abdominal: Bowel sounds are normal. She exhibits no distension and no mass. There is tenderness (Generalized tenderness.). There is no rebound and no guarding.  Musculoskeletal: Normal range of motion. She exhibits no edema or tenderness.  Neurological: She is alert and oriented to person, place, and time. No cranial nerve deficit. She exhibits normal muscle tone.  Skin: Skin is warm. No rash noted. She is diaphoretic. No erythema.    ED Course  Procedures (including critical care time) Labs Review Labs Reviewed - No data to display  Imaging Review No results found.   EKG Interpretation None      MDM   Final  diagnoses:  None   9:11 pm: Worsening of chronic abdominal pain and vomiting with new chest pain.  Denies drug use, but has been positive for cocaine on UDS in the past.  Last CT abdomen in 12/31/13 showed no acute process. With history of CAD, will rule out new ACS with troponins and repeat EKG.  Also, will check CBC, CMP, UDS, and CXR.  Start with 1L bolus and Dilaudid and reassess after lab results.  10:18 pm: WBC 11.9, chronically elevated. Troponin, chest x-ray, BMP unrevealing. Will plan to repeat troponin and EKG in a couple of hours.  Transferred care to Dr. Aline Brochure.  Arman Filter, MD 03/11/14 2219  Pamella Pert, MD 03/12/14 [phone]

## 2014-03-11 NOTE — ED Notes (Signed)
Per EMS: pt reports with substernal CP x2 days radiating down left arm since she began vomiting 2 days ago and symptoms got worse. 324 aspirin given in route and 2 nitro and 4mg  zofran, bringing 10/10 pain down to 7/10.  Pt has hx of stent in 2008 and states this pressure feels the same as it did then.   97/66 97% 140hr 30rr cbg 212

## 2014-03-12 ENCOUNTER — Other Ambulatory Visit: Payer: Self-pay

## 2014-03-12 LAB — TROPONIN I: Troponin I: <0.3 ng/mL (ref <0.30)

## 2014-04-03 ENCOUNTER — Encounter (HOSPITAL_COMMUNITY): Payer: Self-pay | Admitting: Emergency Medicine

## 2014-04-03 ENCOUNTER — Emergency Department (HOSPITAL_COMMUNITY): Payer: Medicaid Other

## 2014-04-03 ENCOUNTER — Observation Stay (HOSPITAL_COMMUNITY)
Admission: EM | Admit: 2014-04-03 | Discharge: 2014-04-04 | Disposition: A | Discharge status: Home / Self Care | Payer: Medicaid Other | Attending: Internal Medicine | Admitting: Internal Medicine

## 2014-04-03 DIAGNOSIS — R1011 Right upper quadrant pain: Secondary | ICD-10-CM | POA: Insufficient documentation

## 2014-04-03 DIAGNOSIS — Z8673 Personal history of transient ischemic attack (TIA), and cerebral infarction without residual deficits: Secondary | ICD-10-CM | POA: Insufficient documentation

## 2014-04-03 DIAGNOSIS — R1032 Left lower quadrant pain: Secondary | ICD-10-CM | POA: Insufficient documentation

## 2014-04-03 DIAGNOSIS — K3184 Gastroparesis: Secondary | ICD-10-CM | POA: Insufficient documentation

## 2014-04-03 DIAGNOSIS — J45909 Unspecified asthma, uncomplicated: Secondary | ICD-10-CM | POA: Diagnosis not present

## 2014-04-03 DIAGNOSIS — Z955 Presence of coronary angioplasty implant and graft: Secondary | ICD-10-CM | POA: Insufficient documentation

## 2014-04-03 DIAGNOSIS — N179 Acute kidney failure, unspecified: Secondary | ICD-10-CM | POA: Diagnosis not present

## 2014-04-03 DIAGNOSIS — R079 Chest pain, unspecified: Secondary | ICD-10-CM | POA: Diagnosis not present

## 2014-04-03 DIAGNOSIS — I10 Essential (primary) hypertension: Secondary | ICD-10-CM | POA: Diagnosis present

## 2014-04-03 DIAGNOSIS — Z79891 Long term (current) use of opiate analgesic: Secondary | ICD-10-CM | POA: Diagnosis not present

## 2014-04-03 DIAGNOSIS — Z87891 Personal history of nicotine dependence: Secondary | ICD-10-CM | POA: Diagnosis not present

## 2014-04-03 DIAGNOSIS — E785 Hyperlipidemia, unspecified: Secondary | ICD-10-CM

## 2014-04-03 DIAGNOSIS — Z008 Encounter for other general examination: Secondary | ICD-10-CM

## 2014-04-03 DIAGNOSIS — F1410 Cocaine abuse, uncomplicated: Secondary | ICD-10-CM | POA: Diagnosis not present

## 2014-04-03 DIAGNOSIS — F1910 Other psychoactive substance abuse, uncomplicated: Secondary | ICD-10-CM | POA: Diagnosis present

## 2014-04-03 DIAGNOSIS — D509 Iron deficiency anemia, unspecified: Secondary | ICD-10-CM | POA: Diagnosis not present

## 2014-04-03 DIAGNOSIS — G629 Polyneuropathy, unspecified: Secondary | ICD-10-CM | POA: Diagnosis not present

## 2014-04-03 DIAGNOSIS — R112 Nausea with vomiting, unspecified: Secondary | ICD-10-CM

## 2014-04-03 DIAGNOSIS — Z794 Long term (current) use of insulin: Secondary | ICD-10-CM | POA: Diagnosis not present

## 2014-04-03 DIAGNOSIS — D473 Essential (hemorrhagic) thrombocythemia: Secondary | ICD-10-CM

## 2014-04-03 DIAGNOSIS — Z9119 Patient's noncompliance with other medical treatment and regimen: Secondary | ICD-10-CM | POA: Insufficient documentation

## 2014-04-03 DIAGNOSIS — M797 Fibromyalgia: Secondary | ICD-10-CM | POA: Diagnosis not present

## 2014-04-03 DIAGNOSIS — I252 Old myocardial infarction: Secondary | ICD-10-CM | POA: Insufficient documentation

## 2014-04-03 DIAGNOSIS — G4733 Obstructive sleep apnea (adult) (pediatric): Secondary | ICD-10-CM | POA: Insufficient documentation

## 2014-04-03 DIAGNOSIS — E1143 Type 2 diabetes mellitus with diabetic autonomic (poly)neuropathy: Secondary | ICD-10-CM | POA: Insufficient documentation

## 2014-04-03 DIAGNOSIS — Z792 Long term (current) use of antibiotics: Secondary | ICD-10-CM | POA: Diagnosis not present

## 2014-04-03 DIAGNOSIS — E669 Obesity, unspecified: Secondary | ICD-10-CM | POA: Insufficient documentation

## 2014-04-03 DIAGNOSIS — Z79899 Other long term (current) drug therapy: Secondary | ICD-10-CM | POA: Insufficient documentation

## 2014-04-03 DIAGNOSIS — D72829 Elevated white blood cell count, unspecified: Secondary | ICD-10-CM

## 2014-04-03 DIAGNOSIS — I2510 Atherosclerotic heart disease of native coronary artery without angina pectoris: Secondary | ICD-10-CM | POA: Diagnosis not present

## 2014-04-03 DIAGNOSIS — K219 Gastro-esophageal reflux disease without esophagitis: Secondary | ICD-10-CM | POA: Diagnosis not present

## 2014-04-03 DIAGNOSIS — R109 Unspecified abdominal pain: Secondary | ICD-10-CM

## 2014-04-03 DIAGNOSIS — E1165 Type 2 diabetes mellitus with hyperglycemia: Secondary | ICD-10-CM

## 2014-04-03 LAB — CBC WITH DIFFERENTIAL/PLATELET
BASOS PCT: 0 % (ref 0–1)
Basophils Absolute: 0 10*3/uL (ref 0.0–0.1)
EOS PCT: 0 % (ref 0–5)
Eosinophils Absolute: 0 10*3/uL (ref 0.0–0.7)
HEMATOCRIT: 32.7 % — AB (ref 36.0–46.0)
HEMOGLOBIN: 10.4 g/dL — AB (ref 12.0–15.0)
LYMPHS ABS: 3 10*3/uL (ref 0.7–4.0)
Lymphocytes Relative: 19 % (ref 12–46)
MCH: 19.4 pg — AB (ref 26.0–34.0)
MCHC: 31.8 g/dL (ref 30.0–36.0)
MCV: 61.1 fL — AB (ref 78.0–100.0)
Monocytes Absolute: 1 10*3/uL (ref 0.1–1.0)
Monocytes Relative: 6 % (ref 3–12)
Neutro Abs: 11.9 10*3/uL — ABNORMAL HIGH (ref 1.7–7.7)
Neutrophils Relative %: 75 % (ref 43–77)
Platelets: 648 10*3/uL — ABNORMAL HIGH (ref 150–400)
RBC: 5.35 MIL/uL — AB (ref 3.87–5.11)
RDW: 18.9 % — ABNORMAL HIGH (ref 11.5–15.5)
WBC: 15.9 10*3/uL — ABNORMAL HIGH (ref 4.0–10.5)

## 2014-04-03 LAB — URINE MICROSCOPIC-ADD ON

## 2014-04-03 LAB — URINALYSIS, ROUTINE W REFLEX MICROSCOPIC
Glucose, UA: 500 mg/dL — AB
KETONES UR: 40 mg/dL — AB
NITRITE: NEGATIVE
PH: 5 (ref 5.0–8.0)
Protein, ur: 100 mg/dL — AB
Specific Gravity, Urine: 1.026 (ref 1.005–1.030)
Urobilinogen, UA: 1 mg/dL (ref 0.0–1.0)

## 2014-04-03 LAB — ABO/RH: ABO/RH(D): A POS

## 2014-04-03 LAB — TYPE AND SCREEN
ABO/RH(D): A POS
Antibody Screen: NEGATIVE

## 2014-04-03 LAB — RAPID URINE DRUG SCREEN, HOSP PERFORMED
AMPHETAMINES: NOT DETECTED
BARBITURATES: NOT DETECTED
BENZODIAZEPINES: NOT DETECTED
COCAINE: POSITIVE — AB
Opiates: NOT DETECTED
TETRAHYDROCANNABINOL: POSITIVE — AB

## 2014-04-03 LAB — BASIC METABOLIC PANEL
Anion gap: 20 — ABNORMAL HIGH (ref 5–15)
BUN: 17 mg/dL (ref 6–23)
CHLORIDE: 95 meq/L — AB (ref 96–112)
CO2: 21 meq/L (ref 19–32)
CREATININE: 1.18 mg/dL — AB (ref 0.50–1.10)
Calcium: 10.3 mg/dL (ref 8.4–10.5)
GFR calc Af Amer: 66 mL/min — ABNORMAL LOW (ref >90)
GFR calc non Af Amer: 57 mL/min — ABNORMAL LOW (ref >90)
GLUCOSE: 273 mg/dL — AB (ref 70–99)
Potassium: 4.1 mEq/L (ref 3.7–5.3)
Sodium: 136 mEq/L — ABNORMAL LOW (ref 137–147)

## 2014-04-03 LAB — HEPATIC FUNCTION PANEL
ALBUMIN: 3.8 g/dL (ref 3.5–5.2)
ALK PHOS: 73 U/L (ref 39–117)
ALT: 6 U/L (ref 0–35)
AST: 20 U/L (ref 0–37)
Bilirubin, Direct: <0.2 mg/dL (ref 0.0–0.3)
Total Bilirubin: 0.4 mg/dL (ref 0.3–1.2)
Total Protein: 9.8 g/dL — ABNORMAL HIGH (ref 6.0–8.3)

## 2014-04-03 LAB — I-STAT TROPONIN, ED: TROPONIN I, POC: 0 ng/mL (ref 0.00–0.08)

## 2014-04-03 LAB — LIPASE, BLOOD: Lipase: 21 U/L (ref 11–59)

## 2014-04-03 LAB — TROPONIN I: Troponin I: <0.3 ng/mL (ref <0.30)

## 2014-04-03 LAB — CBG MONITORING, ED: Glucose-Capillary: 223 mg/dL — ABNORMAL HIGH (ref 70–99)

## 2014-04-03 LAB — GLUCOSE, CAPILLARY
Glucose-Capillary: 188 mg/dL — ABNORMAL HIGH (ref 70–99)
Glucose-Capillary: 271 mg/dL — ABNORMAL HIGH (ref 70–99)

## 2014-04-03 LAB — POC URINE PREG, ED: Preg Test, Ur: NEGATIVE

## 2014-04-03 MED ORDER — SODIUM CHLORIDE 0.9 % IV BOLUS (SEPSIS)
1000.0000 mL | Freq: Once | INTRAVENOUS | Status: AC
  Administered 2014-04-03: 1000 mL via INTRAVENOUS

## 2014-04-03 MED ORDER — GABAPENTIN 300 MG PO CAPS
300.0000 mg | ORAL_CAPSULE | Freq: Three times a day (TID) | ORAL | Status: DC
  Administered 2014-04-03 – 2014-04-04 (×3): 300 mg via ORAL
  Filled 2014-04-03 (×5): qty 1

## 2014-04-03 MED ORDER — SODIUM CHLORIDE 0.9 % IJ SOLN: 3.0000 mL | Freq: Two times a day (BID) | INTRAMUSCULAR | Status: DC

## 2014-04-03 MED ORDER — INSULIN ASPART 100 UNIT/ML ~~LOC~~ SOLN
0.0000 [IU] | Freq: Every day | SUBCUTANEOUS | Status: DC
Start: 2014-04-03 — End: 2014-04-04
  Administered 2014-04-03: 3 [IU] via SUBCUTANEOUS

## 2014-04-03 MED ORDER — INSULIN ASPART 100 UNIT/ML ~~LOC~~ SOLN
0.0000 [IU] | Freq: Three times a day (TID) | SUBCUTANEOUS | Status: DC
  Administered 2014-04-04: 7 [IU] via SUBCUTANEOUS
  Administered 2014-04-04: 5 [IU] via SUBCUTANEOUS

## 2014-04-03 MED ORDER — ACETAMINOPHEN 650 MG RE SUPP: 650.0000 mg | Freq: Four times a day (QID) | RECTAL | Status: DC | PRN

## 2014-04-03 MED ORDER — PANTOPRAZOLE SODIUM 40 MG IV SOLR
40.0000 mg | Freq: Every day | INTRAVENOUS | Status: DC
  Administered 2014-04-03 – 2014-04-04 (×2): 40 mg via INTRAVENOUS
  Filled 2014-04-03 (×2): qty 40

## 2014-04-03 MED ORDER — ASPIRIN 81 MG PO CHEW
324.0000 mg | CHEWABLE_TABLET | Freq: Once | ORAL | Status: AC
  Administered 2014-04-03: 324 mg via ORAL
  Filled 2014-04-03: qty 4

## 2014-04-03 MED ORDER — HEPARIN SODIUM (PORCINE) 5000 UNIT/ML IJ SOLN
5000.0000 [IU] | Freq: Three times a day (TID) | INTRAMUSCULAR | Status: DC
  Administered 2014-04-03 – 2014-04-04 (×4): 5000 [IU] via SUBCUTANEOUS
  Filled 2014-04-03 (×4): qty 1

## 2014-04-03 MED ORDER — HYDROMORPHONE HCL 1 MG/ML IJ SOLN
1.0000 mg | Freq: Once | INTRAMUSCULAR | Status: AC
  Administered 2014-04-03: 1 mg via INTRAVENOUS
  Filled 2014-04-03: qty 1

## 2014-04-03 MED ORDER — ONDANSETRON HCL 4 MG/2ML IJ SOLN
4.0000 mg | Freq: Once | INTRAMUSCULAR | Status: AC
Start: 2014-04-03 — End: 2014-04-03
  Administered 2014-04-03: 4 mg via INTRAVENOUS
  Filled 2014-04-03: qty 2

## 2014-04-03 MED ORDER — TRAMADOL HCL 50 MG PO TABS
50.0000 mg | ORAL_TABLET | Freq: Four times a day (QID) | ORAL | Status: DC | PRN
  Administered 2014-04-04 (×3): 50 mg via ORAL
  Filled 2014-04-03 (×3): qty 1

## 2014-04-03 MED ORDER — ACETAMINOPHEN 325 MG PO TABS
650.0000 mg | ORAL_TABLET | Freq: Four times a day (QID) | ORAL | Status: DC | PRN
  Administered 2014-04-03: 650 mg via ORAL
  Filled 2014-04-03: qty 2

## 2014-04-03 MED ORDER — SODIUM CHLORIDE 0.9 % IV BOLUS (SEPSIS)
1000.0000 mL | Freq: Once | INTRAVENOUS | Status: AC
Start: 2014-04-03 — End: 2014-04-03
  Administered 2014-04-03: 1000 mL via INTRAVENOUS

## 2014-04-03 MED ORDER — ONDANSETRON HCL 4 MG/2ML IJ SOLN
4.0000 mg | Freq: Once | INTRAMUSCULAR | Status: AC
  Administered 2014-04-03: 4 mg via INTRAVENOUS
  Filled 2014-04-03: qty 2

## 2014-04-03 MED ORDER — INSULIN ASPART 100 UNIT/ML ~~LOC~~ SOLN: 0.0000 [IU] | Freq: Three times a day (TID) | SUBCUTANEOUS | Status: DC

## 2014-04-03 MED ORDER — PROMETHAZINE HCL 25 MG/ML IJ SOLN: 12.5000 mg | Freq: Four times a day (QID) | INTRAMUSCULAR | Status: DC | PRN

## 2014-04-03 MED ORDER — SODIUM CHLORIDE 0.9 % IV SOLN
INTRAVENOUS | Status: DC
  Administered 2014-04-03 – 2014-04-04 (×2): via INTRAVENOUS

## 2014-04-03 MED ORDER — ALBUTEROL SULFATE HFA 108 (90 BASE) MCG/ACT IN AERS: 2.0000 | INHALATION_SPRAY | RESPIRATORY_TRACT | Status: DC | PRN

## 2014-04-03 NOTE — ED Provider Notes (Signed)
Patient seen/examined in the Emergency Department in conjunction with Midlevel Provider [facility] Patient reports chest pain Exam : awake/alert, no distress Plan: she reports continued chest pain, will consult internal medicine for admission due to h/o CAD   Sharyon Cable, MD 04/03/14 1442

## 2014-04-03 NOTE — ED Provider Notes (Signed)
CSN: [id number]     Arrival date & time 04/03/14  1107 History   First MD Initiated Contact with Patient 04/03/14 1118     Chief Complaint  Patient presents with  . Emesis  . Chest Pain     (Consider location/radiation/quality/duration/timing/severity/associated sxs/prior Treatment) HPI Comments: Patient with past medical history of obesity, diabetes, gastroparesis, hypertension, CAD status post PCI in 2008, and cocaine abuse presents emergency department with chief complaint of chest pain and abdominal pain. She states that she has had the symptoms for the past 2 days. She states the chest pain is central and stabbing. She reports associated shortness of breath, but denies any diaphoresis or radiating symptoms. Additionally, patient complains of right upper quadrant abdominal pain and persistent vomiting for the past 2 days. She denies any prior history of abdominal surgeries. She denies any fevers, but states that she has had chills. She denies any diarrhea or dysuria. She states that she has not been able take any of her regular medications because the vomiting.  The history is provided by the patient. No language interpreter was used.    Past Medical History  Diagnosis Date  . Thrombocytosis     Hem/Onc suggested 2/2 chronic hepatits and/or iron deficiency anemia  . Iron deficiency anemia   . N&V (nausea and vomiting)     Chronic. Unclear etiology with multiple admission and ED visits. CT abdomen with and without contrast (02/2011)  showed no acute process. Gastic Emptying scan (01/2010) was normal. Ultrasound of the abdomen was within normal limits. Hepatitis B viral load was undectable. HIV NR. EGD - gastritis, Hpylori + s/p Rx  . Hypertension   . Hyperlipidemia   . Diabetes mellitus type 2, uncontrolled, with complications   . Recurrent boils   . Back pain   . OSA (obstructive sleep apnea)   . CAD (coronary artery disease) 06/15/2006    s/p Subendocardial MI with PDA  angioplasty(no stent) on 06/15/06 and relook  cath 06/19/06 showed patency of site. Cath 12/10- no restenosis or significant CAD progression  . Irregular menses     Small ovarian follicles seen on VQ(2/59)  . History of pyelonephritis     H/o GrpB Pyelonephritis (9/06) and UTI- 07/11- E.Coli, 12/10- GBS  . Abscess of tunica vaginalis     10/09- Abundant S. aureus- sensitive to all abx  . Obesity   . Asthma   . Gastritis   . Depression   . Hepatitis B, chronic     Hep BeAb+,Hep B cAb+ & Hep BsAg+ (9/06)  . Peripheral neuropathy   . Fibromyalgia   . GERD (gastroesophageal reflux disease)   . Migraine   . Anxiety   . Gastroparesis     secondary to poorly controlled DM, last emptying study performed 01/2010  was normal but may be falsely positive as pt was on reglan  . Blood dyscrasia   . H/O: CVA (cerebrovascular accident)     history of remote right cerebellar infarct noted on head CT at least since 10/2011  . MI (myocardial infarction)   . Substance abuse   . Cancer     pt states no hx ca 12/31/13   Past Surgical History  Procedure Laterality Date  . Cesarean section  1997   Family History  Problem Relation Age of Onset  . Diabetes Father    History  Substance Use Topics  . Smoking status: Former Smoker    Types: Cigarettes    Quit date: 04/24/1996  . Smokeless  tobacco: Never Used     Comment: quit smoking cigarettes age 71  . Alcohol Use: Yes     Comment: occasionally   OB History    No data available     Review of Systems  Constitutional: Negative for fever and chills.  Respiratory: Positive for shortness of breath.   Cardiovascular: Positive for chest pain.  Gastrointestinal: Positive for nausea and vomiting. Negative for diarrhea and constipation.  Genitourinary: Negative for dysuria.       On her period  All other systems reviewed and are negative.     Allergies  Lisinopril and Morphine and related  Home Medications   Prior to Admission medications    Medication Sig Start Date End Date Taking? Authorizing Provider  albuterol (PROVENTIL HFA;VENTOLIN HFA) 108 (90 BASE) MCG/ACT inhaler Inhale 2 puffs into the lungs every 4 (four) hours as needed for wheezing or shortness of breath.    Historical Provider, MD  amitriptyline (ELAVIL) 25 MG tablet Take 50 mg by mouth at bedtime. 03/03/14   Historical Provider, MD  amoxicillin-clavulanate (AUGMENTIN ES-600) 600-42.9 MG/5ML suspension Take 5 mLs (600 mg total) by mouth 2 (two) times daily. 02/18/14   Jessee Avers, MD  atorvastatin (LIPITOR) 80 MG tablet Take 1 tablet (80 mg total) by mouth daily at 6 PM. Patient not taking: Reported on 03/11/2014 07/25/13   Dixon Boos, MD  clindamycin (CLEOCIN) 100 MG vaginal suppository Place 1 suppository (100 mg total) vaginally at bedtime. 02/20/14   Jessee Avers, MD  dicyclomine (BENTYL) 20 MG tablet Take 1 tablet (20 mg total) by mouth 2 (two) times daily. Patient not taking: Reported on 03/11/2014 12/31/13   Hoy Morn, MD  gabapentin (NEURONTIN) 300 MG capsule Take 300 mg by mouth 3 (three) times daily as needed (pain).     Historical Provider, MD  HYDROcodone-acetaminophen (NORCO) 5-325 MG per tablet Take 1 tablet by mouth every 6 (six) hours as needed for moderate pain. 03/11/14   Pamella Pert, MD  insulin aspart (NOVOLOG) 100 UNIT/ML injection Sliding scale- If sugar 150-199: inject 2 unit;  200-249: 4 units, 250-299: 7 units; 300-349: 10 units; Over 350: 12 units 11/11/13   Bartholomew Crews, MD  Insulin Glargine (LANTUS SOLOSTAR) 100 UNIT/ML Solostar Pen Inject 50 Units into the skin daily at 10 pm. 11/11/13   Bartholomew Crews, MD  labetalol (NORMODYNE) 100 MG tablet Take 1 tablet (100 mg total) by mouth 2 (two) times daily. 03/05/14   Karren Cobble, MD  LORazepam (ATIVAN) 1 MG tablet Take 1 mg by mouth every 8 (eight) hours as needed for anxiety.    Historical Provider, MD  naproxen sodium (ANAPROX) 220 MG tablet Take 440 mg by mouth  2 (two) times daily as needed (pain).    Historical Provider, MD  omeprazole (PRILOSEC) 20 MG capsule Take 1 capsule (20 mg total) by mouth daily. 12/31/13   Hoy Morn, MD  ondansetron (ZOFRAN ODT) 8 MG disintegrating tablet Take 1 tablet (8 mg total) by mouth every 8 (eight) hours as needed for nausea or vomiting. 12/31/13   Hoy Morn, MD  pregabalin (LYRICA) 75 MG capsule Take 1 capsule (75 mg total) by mouth 2 (two) times daily. Patient not taking: Reported on 03/11/2014 11/04/12   Dorian Heckle, MD  promethazine (PHENERGAN) 25 MG suppository Place 1 suppository (25 mg total) rectally every 6 (six) hours as needed for nausea or vomiting. Patient not taking: Reported on 03/11/2014 12/12/13   Harlow Mares,  PA-C  senna-docusate (SENOKOT-S) 8.6-50 MG per tablet Take 1 tablet by mouth daily.    Historical Provider, MD   BP 112/91 mmHg  Pulse 135  Temp(Src) 98.7 F (37.1 C)  Resp 24  SpO2 100%  LMP 02/25/2014 Physical Exam  Constitutional: She is oriented to person, place, and time. She appears well-developed and well-nourished.  HENT:  Head: Normocephalic and atraumatic.  Eyes: Conjunctivae and EOM are normal. Pupils are equal, round, and reactive to light.  Neck: Normal range of motion. Neck supple.  Cardiovascular: Regular rhythm.  Exam reveals no gallop and no friction rub.   No murmur heard. Tachycardic  Pulmonary/Chest: Effort normal and breath sounds normal. No respiratory distress. She has no wheezes. She has no rales. She exhibits no tenderness.  Speaks in complete sentences, clear to auscultation bilaterally  Abdominal: Soft. Bowel sounds are normal. She exhibits no distension and no mass. There is tenderness. There is no rebound and no guarding.  Right upper quadrant tenderness palpation also some left upper quadrant tenderness, no lower abdominal tenderness, no distention, masses, or guarding  Musculoskeletal: Normal range of motion. She exhibits no edema or  tenderness.  Neurological: She is alert and oriented to person, place, and time.  Skin: Skin is warm and dry.  Psychiatric: She has a normal mood and affect. Her behavior is normal. Judgment and thought content normal.  Nursing note and vitals reviewed.   ED Course  Procedures (including critical care time) Results for orders placed or performed during the hospital encounter of 04/03/14  CBC with Differential  Result Value Ref Range   WBC 15.9 (H) 4.0 - 10.5 K/uL   RBC 5.35 (H) 3.87 - 5.11 MIL/uL   Hemoglobin 10.4 (L) 12.0 - 15.0 g/dL   HCT 32.7 (L) 36.0 - 46.0 %   MCV 61.1 (L) 78.0 - 100.0 fL   MCH 19.4 (L) 26.0 - 34.0 pg   MCHC 31.8 30.0 - 36.0 g/dL   RDW 18.9 (H) 11.5 - 15.5 %   Platelets 648 (H) 150 - 400 K/uL   Neutrophils Relative % 75 43 - 77 %   Lymphocytes Relative 19 12 - 46 %   Monocytes Relative 6 3 - 12 %   Eosinophils Relative 0 0 - 5 %   Basophils Relative 0 0 - 1 %   Neutro Abs 11.9 (H) 1.7 - 7.7 K/uL   Lymphs Abs 3.0 0.7 - 4.0 K/uL   Monocytes Absolute 1.0 0.1 - 1.0 K/uL   Eosinophils Absolute 0.0 0.0 - 0.7 K/uL   Basophils Absolute 0.0 0.0 - 0.1 K/uL   RBC Morphology POLYCHROMASIA PRESENT   Basic metabolic panel  Result Value Ref Range   Sodium 136 (L) 137 - 147 mEq/L   Potassium 4.1 3.7 - 5.3 mEq/L   Chloride 95 (L) 96 - 112 mEq/L   CO2 21 19 - 32 mEq/L   Glucose, Bld 273 (H) 70 - 99 mg/dL   BUN 17 6 - 23 mg/dL   Creatinine, Ser 1.18 (H) 0.50 - 1.10 mg/dL   Calcium 10.3 8.4 - 10.5 mg/dL   GFR calc non Af Amer 57 (L) >90 mL/min   GFR calc Af Amer 66 (L) >90 mL/min   Anion gap 20 (H) 5 - 15  Lipase, blood  Result Value Ref Range   Lipase 21 11 - 59 U/L  Hepatic function panel  Result Value Ref Range   Total Protein 9.8 (H) 6.0 - 8.3 g/dL   Albumin 3.8  3.5 - 5.2 g/dL   AST 20 0 - 37 U/L   ALT 6 0 - 35 U/L   Alkaline Phosphatase 73 39 - 117 U/L   Total Bilirubin 0.4 0.3 - 1.2 mg/dL   Bilirubin, Direct <0.2 0.0 - 0.3 mg/dL   Indirect Bilirubin  NOT CALCULATED 0.3 - 0.9 mg/dL  Urinalysis, Routine w reflex microscopic  Result Value Ref Range   Color, Urine RED (A) YELLOW   APPearance TURBID (A) CLEAR   Specific Gravity, Urine 1.026 1.005 - 1.030   pH 5.0 5.0 - 8.0   Glucose, UA 500 (A) NEGATIVE mg/dL   Hgb urine dipstick LARGE (A) NEGATIVE   Bilirubin Urine LARGE (A) NEGATIVE   Ketones, ur 40 (A) NEGATIVE mg/dL   Protein, ur 100 (A) NEGATIVE mg/dL   Urobilinogen, UA 1.0 0.0 - 1.0 mg/dL   Nitrite NEGATIVE NEGATIVE   Leukocytes, UA SMALL (A) NEGATIVE  Urine microscopic-add on  Result Value Ref Range   Squamous Epithelial / LPF FEW (A) RARE   WBC, UA 7-10 <3 WBC/hpf   RBC / HPF TOO NUMEROUS TO COUNT <3 RBC/hpf   Bacteria, UA FEW (A) RARE   Casts HYALINE CASTS (A) NEGATIVE   Urine-Other MUCOUS PRESENT   I-stat troponin, ED  Result Value Ref Range   Troponin i, poc 0.00 0.00 - 0.08 ng/mL   Comment 3          POC urine preg, ED (not at [facility])  Result Value Ref Range   Preg Test, Ur NEGATIVE NEGATIVE  CBG monitoring, ED  Result Value Ref Range   Glucose-Capillary 223 (H) 70 - 99 mg/dL   Dg Chest 2 View  04/03/2014   CLINICAL DATA:  Vomiting, weakness.  EXAM: CHEST  2 VIEW  COMPARISON:  March 11, 2014.  FINDINGS: The heart size and mediastinal contours are within normal limits. Both lungs are clear. No pneumothorax or pleural effusion is noted. The visualized skeletal structures are unremarkable.  IMPRESSION: No active cardiopulmonary disease.   Electronically Signed   By: Sabino Dick M.D.   On: 04/03/2014 12:15   Dg Chest 2 View  03/11/2014   CLINICAL DATA:  Left-sided chest pain for 2 days. Weakness since this evening.  EXAM: CHEST  2 VIEW  COMPARISON:  11/25/2013  FINDINGS: The heart size and mediastinal contours are within normal limits. Both lungs are clear. The visualized skeletal structures are unremarkable.  IMPRESSION: No active cardiopulmonary disease.   Electronically Signed   By: Lucienne Capers M.D.   On:  03/11/2014 21:51   US Abdomen Limited  04/03/2014   CLINICAL DATA:  38 year old female with right upper quadrant abdominal pain  EXAM: US ABDOMEN LIMITED - RIGHT UPPER QUADRANT  COMPARISON:  Prior CT abdomen/pelvis 12/31/2013  FINDINGS: Gallbladder:  Small amounts of minimally echogenic mobile material within the gallbladder lumen consistent with sludge. The gallbladder wall is not thickened and there is no pericholecystic fluid. Per the sonographer, the sonographic Percell Miller sign was negative.  Common bile duct:  Diameter:  Within normal limits at 3 mm  Liver:  No focal lesion identified. Within normal limits in parenchymal echogenicity. Main portal vein patent with hepatopetal flow.  IMPRESSION: 1. Trace gallbladder sludge without sonographic findings to suggest cholecystitis. 2. Otherwise, unremarkable right upper quadrant abdominal ultrasound.   Electronically Signed   By: Jacqulynn Cadet M.D.   On: 04/03/2014 13:00     Imaging Review No results found.   EKG Interpretation   Date/Time:  Friday April 03 2014 11:16:43 EST Ventricular Rate:  136 PR Interval:  112 QRS Duration: 80 QT Interval:  308 QTC Calculation: 463 R Axis:   75 Text Interpretation:  Sinus tachycardia Otherwise normal ECG rate is  faster when compared to prior EKG Confirmed by Christy Gentles  MD, Elenore Rota  [id number]) on 04/03/2014 11:28:39 AM      MDM   Final diagnoses:  Encounter for medical assessment  Chest pain, unspecified chest pain type  Abdominal pain, unspecified abdominal location    Patient with multiple comorbidities presents with chest pain and abdominal pain. She also has nausea and vomiting. Will check labs, give fluids, treat symptoms. Patient is known to be tachycardic at this time, in reviewing prior notes she has frequently had pronounced tachycardia.  Will reassess after fluids.  Patient seen by and discussed with Dr. Christy Gentles, who recommends admission for chest pain rule out.  HEART score is  4.  Ultrasound remarkable for gallbladder sludge, but no evidence of cholecystitis. We'll continue fluids. Laboratory studies remarkable for anion gap of 20. Glucose 273. We'll continue to give fluids and will reassess.  Patient discussed with internal medicine teaching service, who will admit the patient. Temporary admission orders to be placed by teaching service.  Montine Circle, PA-C 04/03/14 [place], MD 04/03/14 Drema Halon

## 2014-04-03 NOTE — ED Notes (Signed)
Pt c/o N/V, CP, cough, chills x several days; pt noted to be tachycardic

## 2014-04-03 NOTE — Progress Notes (Addendum)
IM teaching service paged per pt request for pain medication. Shirley [name] 5:45 PM   Paged second time. 6:28 PM   No page returned. 6:59 PM   MD to place order for pain med. 7:04 PM

## 2014-04-03 NOTE — H&P (Signed)
Date: 04/03/2014               Patient Name:  Shirley [name] MRN: [id number]  DOB: 1976-01-26 Age / Sex: 38 y.o., female   PCP: Luan Moore, MD         Medical Service: Internal Medicine Teaching Service         Attending Physician: Dr. Sharyon Cable, MD    First Contact: Dr. Randell Patient Pager: [phone]  Second Contact: Dr. Redmond Pulling Pager: [phone]       After Hours (After 5p/  First Contact Pager: [phone]  weekends / holidays): Second Contact Pager: [phone]   Chief Complaint: chest pain  History of Present Illness: [name] is a 38 year old female with history of iron deficiency anemia, HTN, HLD, DM2, OSA, CAD s/p angioplasty in 2008, asthma, GERD, cocaine use, fibromyalgia presenting with chest pain, N/V, abdominal pain. She started having nausea/vomiting 2 days ago. She reports seeing some specks of blood/black in the emesis. Emesis x 6-7 today. The chest pain started shortly after. She describes it as squeezing, sharp, midsternal. Radiates to her left arm. Constant with intermittent increases in severity to 10/10 at worse every few hours. No exacerbating/relieving factors. Similar to chest pain with MI in 2008. She has associated diaphoresis, dizziness, and shortness of breath. Last used cocaine 5 days ago.  She has been on her menstrual period since Monday. She reports it is heavy, wears 2 pads at a time and changes it 5-6 times a day. +Clots. Her periods come once a month, are heavy and last 7 days.  She has chills but no subjective fevers. She reports lightheadedness with standing. Denies vision changes, cough, changes in chronic LLQ abdominal pain, diarrhea, hematochezia, melena, dysuria, hematuria, rash, bleeding/bruising, changes to chronic LE edema, localized weakness.  Multiple ED visits over last year with similar complaints. Last seen in ED 03/11/2014 with similar presentation. Her tachycardia improved with IV fluids. Pain was controlled with dilaudid. Troponins  were negative and EKG unremarkable.  Cardiologist: Dr. Einar Gip   Meds: No current facility-administered medications for this encounter.   Current Outpatient Prescriptions  Medication Sig Dispense Refill  . albuterol (PROVENTIL HFA;VENTOLIN HFA) 108 (90 BASE) MCG/ACT inhaler Inhale 2 puffs into the lungs every 4 (four) hours as needed for wheezing or shortness of breath.    Marland Kitchen amitriptyline (ELAVIL) 25 MG tablet Take 50 mg by mouth at bedtime.  3  . atorvastatin (LIPITOR) 80 MG tablet Take 80 mg by mouth daily.    Marland Kitchen gabapentin (NEURONTIN) 300 MG capsule Take 300 mg by mouth 3 (three) times daily.     . insulin aspart (NOVOLOG) 100 UNIT/ML injection Sliding scale- If sugar 150-199: inject 2 unit;  200-249: 4 units, 250-299: 7 units; 300-349: 10 units; Over 350: 12 units (Patient taking differently: Inject 2-12 Units into the skin 3 (three) times daily with meals. Sliding scale- If sugar 150-199: inject 2 unit;  200-249: 4 units, 250-299: 7 units; 300-349: 10 units; Over 350: 12 units) 10 mL 2  . Insulin Glargine (LANTUS SOLOSTAR) 100 UNIT/ML Solostar Pen Inject 50 Units into the skin daily at 10 pm. 15 mL 2  . labetalol (NORMODYNE) 100 MG tablet Take 1 tablet (100 mg total) by mouth 2 (two) times daily. 180 tablet 3  . naproxen sodium (ANAPROX) 220 MG tablet Take 440 mg by mouth 2 (two) times daily as needed (pain).    Marland Kitchen omeprazole (PRILOSEC) 20 MG capsule Take 1 capsule (20 mg  total) by mouth daily. 30 capsule 0  . ondansetron (ZOFRAN ODT) 8 MG disintegrating tablet Take 1 tablet (8 mg total) by mouth every 8 (eight) hours as needed for nausea or vomiting. 10 tablet 0  . amoxicillin-clavulanate (AUGMENTIN ES-600) 600-42.9 MG/5ML suspension Take 5 mLs (600 mg total) by mouth 2 (two) times daily. (Patient not taking: Reported on 04/03/2014) 200 mL 0  . atorvastatin (LIPITOR) 80 MG tablet Take 1 tablet (80 mg total) by mouth daily at 6 PM. (Patient not taking: Reported on 03/11/2014) 30 tablet 2  .  clindamycin (CLEOCIN) 100 MG vaginal suppository Place 1 suppository (100 mg total) vaginally at bedtime. (Patient not taking: Reported on 04/03/2014) 3 suppository 0  . dicyclomine (BENTYL) 20 MG tablet Take 1 tablet (20 mg total) by mouth 2 (two) times daily. (Patient not taking: Reported on 04/03/2014) 20 tablet 0  . HYDROcodone-acetaminophen (NORCO) 5-325 MG per tablet Take 1 tablet by mouth every 6 (six) hours as needed for moderate pain. (Patient not taking: Reported on 04/03/2014) 10 tablet 0  . LORazepam (ATIVAN) 1 MG tablet Take 1 mg by mouth every 8 (eight) hours as needed for anxiety.    . pregabalin (LYRICA) 75 MG capsule Take 1 capsule (75 mg total) by mouth 2 (two) times daily. (Patient not taking: Reported on 03/11/2014) 60 capsule 6  . promethazine (PHENERGAN) 25 MG suppository Place 1 suppository (25 mg total) rectally every 6 (six) hours as needed for nausea or vomiting. (Patient not taking: Reported on 03/11/2014) 6 each 0  . senna-docusate (SENOKOT-S) 8.6-50 MG per tablet Take 1 tablet by mouth daily.      Allergies: Allergies as of 04/03/2014 - Review Complete 04/03/2014  Allergen Reaction Noted  . Lisinopril Nausea Only 03/17/2008  . Morphine and related Itching 04/15/2013   Past Medical History  Diagnosis Date  . Thrombocytosis     Hem/Onc suggested 2/2 chronic hepatits and/or iron deficiency anemia  . Iron deficiency anemia   . N&V (nausea and vomiting)     Chronic. Unclear etiology with multiple admission and ED visits. CT abdomen with and without contrast (02/2011)  showed no acute process. Gastic Emptying scan (01/2010) was normal. Ultrasound of the abdomen was within normal limits. Hepatitis B viral load was undectable. HIV NR. EGD - gastritis, Hpylori + s/p Rx  . Hypertension   . Hyperlipidemia   . Diabetes mellitus type 2, uncontrolled, with complications   . Recurrent boils   . Back pain   . OSA (obstructive sleep apnea)   . CAD (coronary artery disease)  06/15/2006    s/p Subendocardial MI with PDA angioplasty(no stent) on 06/15/06 and relook  cath 06/19/06 showed patency of site. Cath 12/10- no restenosis or significant CAD progression  . Irregular menses     Small ovarian follicles seen on FA(2/13)  . History of pyelonephritis     H/o GrpB Pyelonephritis (9/06) and UTI- 07/11- E.Coli, 12/10- GBS  . Abscess of tunica vaginalis     10/09- Abundant S. aureus- sensitive to all abx  . Obesity   . Asthma   . Gastritis   . Depression   . Hepatitis B, chronic     Hep BeAb+,Hep B cAb+ & Hep BsAg+ (9/06)  . Peripheral neuropathy   . Fibromyalgia   . GERD (gastroesophageal reflux disease)   . Migraine   . Anxiety   . Gastroparesis     secondary to poorly controlled DM, last emptying study performed 01/2010  was normal but may  be falsely positive as pt was on reglan  . Blood dyscrasia   . H/O: CVA (cerebrovascular accident)     history of remote right cerebellar infarct noted on head CT at least since 10/2011  . MI (myocardial infarction)   . Substance abuse   . Cancer     pt states no hx ca 12/31/13   Past Surgical History  Procedure Laterality Date  . Cesarean section  1997   Family History  Problem Relation Age of Onset  . Diabetes Father    History   Social History  . Marital Status: Single    Spouse Name: N/A    Number of Children: 2  . Years of Education: 11   Occupational History  . other     unemployed   Social History Main Topics  . Smoking status: Former Smoker    Types: Cigarettes    Quit date: 04/24/1996  . Smokeless tobacco: Never Used     Comment: quit smoking cigarettes age 24  . Alcohol Use: Yes     Comment: occasionally  . Drug Use: Yes    Special: Marijuana, Cocaine     Comment: Cocaine  . Sexual Activity: Yes   Other Topics Concern  . 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   Also works at [employer] having  to lift elderly individuals.          Review of Systems: Constitutional: no fevers, +chills Eyes: no vision changes Ears, nose, mouth, throat, and face: no cough Respiratory: +shortness of breath Cardiovascular: +chest pain Gastrointestinal: +nausea/vomiting, +abdominal pain, no constipation, no diarrhea Genitourinary: no dysuria, no hematuria Integument: no rash Hematologic/lymphatic: no bleeding/bruising, no edema Musculoskeletal: +fibromyalgia Neurological: +chronic paresthesias, no weakness   Physical Exam: Blood pressure 127/81, pulse 110, temperature 98.7 F (37.1 C), resp. rate 11, last menstrual period 02/25/2014, SpO2 96 %. General Apperance: NAD Head: Normocephalic, atraumatic Eyes: PERRL, EOMI, anicteric sclera Ears: Normal external ear canal Nose: Nares normal, septum midline, mucosa normal Throat: Lips, mucosa and tongue normal  Neck: Supple, trachea midline Back: No tenderness or bony abnormality  Lungs: Clear to auscultation bilaterally. No wheezes, rhonchi or rales. Breathing comfortably on RA Chest Wall: Mild tenderness to palpation, no deformity Heart: Tachycardic rate and regularrhythm, no murmur/rub/gallop Abdomen: Soft, nontender, nondistended, no rebound/guarding Extremities: Normal, atraumatic, warm and well perfused, no edema Pulses: 2+ throughout Skin: No rashes or lesions Neurologic: Alert and oriented x 3. CNII-XII intact. Normal strength and sensation  Lab results: Basic Metabolic Panel:  Recent Labs  04/03/14 1121  NA 136*  K 4.1  CL 95*  CO2 21  GLUCOSE 273*  BUN 17  CREATININE 1.18*  CALCIUM 10.3   Liver Function Tests:  Recent Labs  04/03/14 1121  AST 20  ALT 6  ALKPHOS 73  BILITOT 0.4  PROT 9.8*  ALBUMIN 3.8    Recent Labs  04/03/14 1121  LIPASE 21   CBC:  Recent Labs  04/03/14 1121  WBC 15.9*  NEUTROABS 11.9*  HGB 10.4*  HCT 32.7*  MCV 61.1*  PLT 648*   CBG:  Recent Labs  04/03/14 1355  GLUCAP  223*   Urine Drug Screen: Drugs of Abuse     Component Value Date/Time   LABOPIA NONE DETECTED 04/03/2014 1311   LABOPIA NEGATIVE 02/21/2010 2201   COCAINSCRNUR POSITIVE* 04/03/2014 1311   COCAINSCRNUR NEGATIVE 02/21/2010 2201  LABBENZ NONE DETECTED 04/03/2014 1311   LABBENZ NEGATIVE 02/21/2010 2201   AMPHETMU NONE DETECTED 04/03/2014 1311   AMPHETMU NEGATIVE 02/21/2010 2201   THCU POSITIVE* 04/03/2014 1311   LABBARB NONE DETECTED 04/03/2014 1311    Urinalysis:  Recent Labs  04/03/14 1311  COLORURINE RED*  LABSPEC 1.026  PHURINE 5.0  GLUCOSEU 500*  HGBUR LARGE*  BILIRUBINUR LARGE*  KETONESUR 40*  PROTEINUR 100*  UROBILINOGEN 1.0  NITRITE NEGATIVE  LEUKOCYTESUR SMALL*    Imaging results:  Dg Chest 2 View  04/03/2014   CLINICAL DATA:  Vomiting, weakness.  EXAM: CHEST  2 VIEW  COMPARISON:  March 11, 2014.  FINDINGS: The heart size and mediastinal contours are within normal limits. Both lungs are clear. No pneumothorax or pleural effusion is noted. The visualized skeletal structures are unremarkable.  IMPRESSION: No active cardiopulmonary disease.   Electronically Signed   By: Sabino Dick M.D.   On: 04/03/2014 12:15   US Abdomen Limited  04/03/2014   CLINICAL DATA:  39 year old female with right upper quadrant abdominal pain  EXAM: US ABDOMEN LIMITED - RIGHT UPPER QUADRANT  COMPARISON:  Prior CT abdomen/pelvis 12/31/2013  FINDINGS: Gallbladder:  Small amounts of minimally echogenic mobile material within the gallbladder lumen consistent with sludge. The gallbladder wall is not thickened and there is no pericholecystic fluid. Per the sonographer, the sonographic Percell Miller sign was negative.  Common bile duct:  Diameter:  Within normal limits at 3 mm  Liver:  No focal lesion identified. Within normal limits in parenchymal echogenicity. Main portal vein patent with hepatopetal flow.  IMPRESSION: 1. Trace gallbladder sludge without sonographic findings to suggest cholecystitis. 2.  Otherwise, unremarkable right upper quadrant abdominal ultrasound.   Electronically Signed   By: Jacqulynn Cadet M.D.   On: 04/03/2014 13:00    Other results: EKG: Sinus tachycardia, Q waves in III, aVF. Unchanged otherwise from previous EKG.  Assessment & Plan by Problem: Active Problems:   Essential hypertension   Abdominal pain, left lower quadrant   Chest pain  Chest pain: History of CAD with angioplasty in 2008. She had a cath in 2010 that demonstrated no restenosis or significant CAD progression. Differential includes acute coronary syndrome, cocaine induced vasospasm, PE, aortic dissection, pneumothorax, pericardial effusion, pericarditis/myocarditis, pneumonia, esophageal rupture, GERD, MSK. Some tenderness to palpation of chest but otherwise not reproducible on palpation making MSK etiology less likely. She does have a history of GERD. CXR with no acute cardiopulmonary process making pneumothorax, aortic dissection, pericardial effusion, pneumonia, esophageal rupture less likely. Wells score 1.5 and Geneva score 5 (heart rate >100) making her low and moderate risk for PE, respectively. Last used cocaine 5 days ago. Initial troponin negative and EKG without acute ischemic changes. Her TIMI score is 2 (>3 CAD risk factors, known CAD) - 8% risk. Spoke with cardiologist, Dr. Einar Gip - will plan to consult if workup positive for ACS. She received ASA 324mg  in the ED -repeat EKG in AM -trend troponins -tele  Nausea, vomiting, abdominal pain: history of gastroparesis, GERD, H pylori gastritis in 2011 (Rx with Pylera and omeprazole). She has had chronic nausea/vomiting, abdominal pain for the past 2 years. US abdomen unremarkable with trace gallbladder sludge without sonographic findings to suggest cholecystitis. -continue protonix 40mg  IV daily -continue home phenergan 12.5mg  Q6hr prn -consider GI consult in AM -H pylori Stool antigen  AKI: Baseline cr 1. Admission Cr 1.18. Likely 2/2 volume  depletion from vomiting, poor PO intake -NS@125ml /hr  Chronic Iron Deficiency Anemia: Baseline around  9-10. Admission Hgb 10.4. Continue to monitor. -FOBT -Repeat CBC in AM  Chronic Leukocytosis, thrombocytosis: WBC 15.9 and plt 648. Chronically elevated. Previously noted that CML highly unlikely. Unclear etiology of leukocytosis. Chronic thrombocytosis thought to be due to iron deficiency. -Continue to monitor  DM2, poorly controlled: Last Hgb A1c 02/18/2014 10.3. On Lantus 50u daily and Novolog SSI. -SSI -CBG QAC/HS  HLD: Last lipid panel 07/21/2013. LDL elevated at 108 but otherwise all within normal limits. Has not been taking Lipitor 80mg  daily.  Asthma: -Albuterol neb q6hr prn  Peripheral neuropathy: -Continue home gabapentin 300mg  TID  FEN: -NS@125ml /hr -Clear liquid diet  DVT ppx: subq hep 5000u TID  Dispo: Disposition is deferred at this time, awaiting improvement of current medical problems. Anticipated discharge in approximately 1-2 day(s).   The patient does have a current PCP Luan Moore, MD) and does need an [facility] hospital follow-up appointment after discharge.  The patient does not have transportation limitations that hinder transportation to clinic appointments.  Signed: Jacques Earthly, MD 04/03/2014, 2:53 PM

## 2014-04-03 NOTE — ED Notes (Signed)
Patient transported to Ultrasound 

## 2014-04-04 DIAGNOSIS — R1011 Right upper quadrant pain: Secondary | ICD-10-CM | POA: Diagnosis not present

## 2014-04-04 DIAGNOSIS — F1910 Other psychoactive substance abuse, uncomplicated: Secondary | ICD-10-CM

## 2014-04-04 DIAGNOSIS — R079 Chest pain, unspecified: Secondary | ICD-10-CM | POA: Diagnosis not present

## 2014-04-04 DIAGNOSIS — I10 Essential (primary) hypertension: Secondary | ICD-10-CM

## 2014-04-04 DIAGNOSIS — R112 Nausea with vomiting, unspecified: Secondary | ICD-10-CM | POA: Diagnosis not present

## 2014-04-04 DIAGNOSIS — R1032 Left lower quadrant pain: Secondary | ICD-10-CM | POA: Diagnosis not present

## 2014-04-04 LAB — CBC
HCT: 26 % — ABNORMAL LOW (ref 36.0–46.0)
HEMOGLOBIN: 7.9 g/dL — AB (ref 12.0–15.0)
MCH: 18.6 pg — ABNORMAL LOW (ref 26.0–34.0)
MCHC: 30.4 g/dL (ref 30.0–36.0)
MCV: 61.3 fL — ABNORMAL LOW (ref 78.0–100.0)
Platelets: 483 10*3/uL — ABNORMAL HIGH (ref 150–400)
RBC: 4.24 MIL/uL (ref 3.87–5.11)
RDW: 18.5 % — ABNORMAL HIGH (ref 11.5–15.5)
WBC: 9.7 10*3/uL (ref 4.0–10.5)

## 2014-04-04 LAB — BASIC METABOLIC PANEL
ANION GAP: 13 (ref 5–15)
BUN: 13 mg/dL (ref 6–23)
CALCIUM: 8.7 mg/dL (ref 8.4–10.5)
CO2: 20 mEq/L (ref 19–32)
Chloride: 104 mEq/L (ref 96–112)
Creatinine, Ser: 0.82 mg/dL (ref 0.50–1.10)
GFR calc non Af Amer: 88 mL/min — ABNORMAL LOW (ref >90)
Glucose, Bld: 237 mg/dL — ABNORMAL HIGH (ref 70–99)
Potassium: 3.9 mEq/L (ref 3.7–5.3)
SODIUM: 137 meq/L (ref 137–147)

## 2014-04-04 LAB — GLUCOSE, CAPILLARY
GLUCOSE-CAPILLARY: 325 mg/dL — AB (ref 70–99)
GLUCOSE-CAPILLARY: 290 mg/dL — AB (ref 70–99)
GLUCOSE-CAPILLARY: 293 mg/dL — AB (ref 70–99)

## 2014-04-04 LAB — TROPONIN I: Troponin I: <0.3 ng/mL (ref <0.30)

## 2014-04-04 MED ORDER — PROMETHAZINE HCL 25 MG RE SUPP: 25.0000 mg | Freq: Four times a day (QID) | RECTAL | Status: DC | PRN

## 2014-04-04 MED ORDER — PANTOPRAZOLE SODIUM 40 MG PO TBEC: 40.0000 mg | DELAYED_RELEASE_TABLET | Freq: Every day | ORAL | Status: DC

## 2014-04-04 NOTE — Progress Notes (Signed)
Subjective: NAEO. Pt requested advancement in diet last night. Reports her chest pain is improving. Last emesis per patient was last night. No medication given per EMR. Tolerating breakfast this morning. She ate all of the food from her breakfast tray.  Objective: Vital signs in last 24 hours: Filed Vitals:   04/03/14 1630 04/03/14 1705 04/03/14 2129 04/04/14 0601  BP: 145/88 137/90 121/76 108/74  Pulse: 102 104 89 103  Temp:  98.1 F (36.7 C)  98.4 F (36.9 C)  TempSrc:  Oral  Oral  Resp: 13 17 18 18   Weight:  202 lb 13.2 oz (92 kg)  206 lb 5.6 oz (93.6 kg)  SpO2: 96% 100% 100% 100%   Weight change:   Intake/Output Summary (Last 24 hours) at 04/04/14 1347 Last data filed at 04/04/14 1300  Gross per 24 hour  Intake    480 ml  Output      0 ml  Net    480 ml   General Apperance: NAD HEENT: Normocephalic, atraumatic, PERRL, EOMI, anicteric sclera Neck: Supple, trachea midline Back: No tenderness or bony abnormality  Lungs: Clear to auscultation bilaterally. No wheezes, rhonchi or rales. Breathing comfortably on RA Chest Wall: Mild tenderness to palpation, no deformity Heart: RRR, no murmur/rub/gallop Abdomen: Soft, nontender, nondistended, no rebound/guarding Extremities: Normal, atraumatic, warm and well perfused, no edema Pulses: 2+ throughout Skin: No rashes or lesions Neurologic: Alert and oriented x 3. CNII-XII intact. Normal strength and sensation  Lab Results: Basic Metabolic Panel:  Recent Labs Lab 04/03/14 1121 04/04/14 0238  NA 136* 137  K 4.1 3.9  CL 95* 104  CO2 21 20  GLUCOSE 273* 237*  BUN 17 13  CREATININE 1.18* 0.82  CALCIUM 10.3 8.7   Liver Function Tests:  Recent Labs Lab 04/03/14 1121  AST 20  ALT 6  ALKPHOS 73  BILITOT 0.4  PROT 9.8*  ALBUMIN 3.8    Recent Labs Lab 04/03/14 1121  LIPASE 21   CBC:  Recent Labs Lab 04/03/14 1121 04/04/14 0904  WBC 15.9* 9.7  NEUTROABS 11.9*  --   HGB 10.4* 7.9*  HCT 32.7* 26.0*    MCV 61.1* 61.3*  PLT 648* 483*   Cardiac Enzymes:  Recent Labs Lab 04/03/14 1520 04/03/14 2113 04/04/14 0238  TROPONINI <0.30 <0.30 <0.30   CBG:  Recent Labs Lab 04/03/14 1355 04/03/14 1709 04/03/14 2127 04/04/14 0611 04/04/14 1131  GLUCAP 223* 188* 271* 325* 290*   Urine Drug Screen: Drugs of Abuse     Component Value Date/Time   LABOPIA NONE DETECTED 04/03/2014 1311   LABOPIA NEGATIVE 02/21/2010 2201   COCAINSCRNUR POSITIVE* 04/03/2014 1311   COCAINSCRNUR NEGATIVE 02/21/2010 2201   LABBENZ NONE DETECTED 04/03/2014 1311   LABBENZ NEGATIVE 02/21/2010 2201   AMPHETMU NONE DETECTED 04/03/2014 1311   AMPHETMU NEGATIVE 02/21/2010 2201   THCU POSITIVE* 04/03/2014 1311   LABBARB NONE DETECTED 04/03/2014 1311    Urinalysis:  Recent Labs Lab 04/03/14 1311  COLORURINE RED*  LABSPEC 1.026  PHURINE 5.0  GLUCOSEU 500*  HGBUR LARGE*  BILIRUBINUR LARGE*  KETONESUR 40*  PROTEINUR 100*  UROBILINOGEN 1.0  NITRITE NEGATIVE  LEUKOCYTESUR SMALL*   Micro Results: No results found for this or any previous visit (from the past 240 hour(s)). Studies/Results: Dg Chest 2 View  04/03/2014   CLINICAL DATA:  Vomiting, weakness.  EXAM: CHEST  2 VIEW  COMPARISON:  March 11, 2014.  FINDINGS: The heart size and mediastinal contours are within normal limits. Both lungs  are clear. No pneumothorax or pleural effusion is noted. The visualized skeletal structures are unremarkable.  IMPRESSION: No active cardiopulmonary disease.   Electronically Signed   By: Sabino Dick M.D.   On: 04/03/2014 12:15   US Abdomen Limited  04/03/2014   CLINICAL DATA:  38 year old female with right upper quadrant abdominal pain  EXAM: US ABDOMEN LIMITED - RIGHT UPPER QUADRANT  COMPARISON:  Prior CT abdomen/pelvis 12/31/2013  FINDINGS: Gallbladder:  Small amounts of minimally echogenic mobile material within the gallbladder lumen consistent with sludge. The gallbladder wall is not thickened and there is no  pericholecystic fluid. Per the sonographer, the sonographic Percell Miller sign was negative.  Common bile duct:  Diameter:  Within normal limits at 3 mm  Liver:  No focal lesion identified. Within normal limits in parenchymal echogenicity. Main portal vein patent with hepatopetal flow.  IMPRESSION: 1. Trace gallbladder sludge without sonographic findings to suggest cholecystitis. 2. Otherwise, unremarkable right upper quadrant abdominal ultrasound.   Electronically Signed   By: Jacqulynn Cadet M.D.   On: 04/03/2014 13:00   Medications: I have reviewed the patient's current medications. Scheduled Meds: . gabapentin  300 mg Oral TID  . heparin  5,000 Units Subcutaneous 3 times per day  . insulin aspart  0-5 Units Subcutaneous QHS  . insulin aspart  0-9 Units Subcutaneous TID WC  . [START ON 04/05/2014] pantoprazole  40 mg Oral Daily  . sodium chloride  3 mL Intravenous Q12H   Continuous Infusions: . sodium chloride 125 mL/hr at 04/04/14 1318   PRN Meds:.acetaminophen **OR** acetaminophen, albuterol, promethazine, traMADol Assessment/Plan: Active Problems:   Polysubstance abuse   Essential hypertension   Abdominal pain, left lower quadrant   NAUSEA AND VOMITING   Chest pain  Chest pain: Troponins negative x 3. Repeat EKG without acute ischemic changes, HR improved this morning. May be induced by cocaine use.  -Counseled patient on stopping cocaine as this can cause her symptoms. -Tele  Nausea, vomiting, abdominal pain: history of gastroparesis, GERD, H pylori gastritis in 2011 (Rx with Pylera and omeprazole). Tolerating diet this morning. Has not received any phenergan. -continue protonix 40mg  IV daily -continue home phenergan 12.5mg  Q6hr prn -H pylori Stool antigen -Will need outpatient referral/follow up with GI  AKI: Baseline cr 1. Admission Cr 1.18. Likely 2/2 volume depletion from vomiting, poor PO intake. Resolved this morning. -d/c NS@125ml /hr  Chronic Iron Deficiency Anemia:  Baseline around 9-10. Admission Hgb 10.4. -Continue to monitor.  Chronic Leukocytosis, thrombocytosis: WBC 15.9 and plt 648. Chronically elevated. Previously noted that CML highly unlikely. Unclear etiology of leukocytosis. Chronic thrombocytosis thought to be due to iron deficiency. -Continue to monitor  DM2, poorly controlled: Last Hgb A1c 02/18/2014 10.3. On Lantus 50u daily and Novolog SSI. -SSI -CBG QAC/HS  HLD: Last lipid panel 07/21/2013. LDL elevated at 108 but otherwise all within normal limits. Has not been taking Lipitor 80mg  daily. -Restart on discharge  Asthma: -Albuterol neb q6hr prn  Peripheral neuropathy: -Continue home gabapentin 300mg  TID  FEN: -Heart healthy diet  DVT ppx: subq hep 5000u TID  Dispo: Likely home today  The patient does have a current PCP Luan Moore, MD) and does need an [facility] hospital follow-up appointment after discharge.  The patient does not have transportation limitations that hinder transportation to clinic appointments.  .Services Needed at time of discharge: Y = Yes, Blank = No PT:   OT:   RN:   Equipment:   Other:     LOS: 1 day  Jacques Earthly, MD 04/04/2014, 1:47 PM

## 2014-04-04 NOTE — Discharge Instructions (Signed)
°  Chest Pain (Nonspecific) It is often hard to give a diagnosis for the cause of chest pain. There is always a chance that your pain could be related to something serious, such as a heart attack or a blood clot in the lungs. You need to follow up with your doctor. HOME CARE  If antibiotic medicine was given, take it as directed by your doctor. Finish the medicine even if you start to feel better.  For the next few days, avoid activities that bring on chest pain. Continue physical activities as told by your doctor.  Do not use any tobacco products or cocaine. This includes cigarettes, chewing tobacco, and e-cigarettes.  Avoid drinking alcohol.  Only take medicine as told by your doctor.  Follow your doctor's suggestions for more testing if your chest pain does not go away.  Keep all doctor visits you made. GET HELP IF:  Your chest pain does not go away, even after treatment.  You have a rash with blisters on your chest.  You have a fever. GET HELP RIGHT AWAY IF:   You have more pain or pain that spreads to your arm, neck, jaw, back, or belly (abdomen).  You have shortness of breath.  You cough more than usual or cough up blood.  You have very bad back or belly pain.  You feel sick to your stomach (nauseous) or throw up (vomit).  You have very bad weakness.  You pass out (faint).  You have chills. This is an emergency. Do not wait to see if the problems will go away. Call your local emergency services (911 in [place] Do not drive yourself to the hospital. MAKE SURE YOU:   Understand these instructions.  Will watch your condition.  Will get help right away if you are not doing well or get worse. Document Released: 09/27/2007 Document Revised: 04/15/2013 Document Reviewed: 09/27/2007 [facility] Patient Information 2015 [place], [place]. This information is not intended to replace advice given to you by your health care provider. Make sure you discuss any questions you have  with your health care provider.

## 2014-04-04 NOTE — Discharge Summary (Signed)
Name: Shirley [name] MRN: [id number] DOB: 1975/10/17 38 y.o. PCP: Luan Moore, MD  Date of Admission: 04/03/2014 11:16 AM Date of Discharge: 04/04/2014 Attending Physician: Madilyn Fireman, MD  Discharge Diagnosis: Active Problems:   Polysubstance abuse   Essential hypertension   Abdominal pain, left lower quadrant   NAUSEA AND VOMITING   Chest pain  Discharge Medications:   Medication List    STOP taking these medications        amitriptyline 25 MG tablet  Commonly known as:  ELAVIL     amoxicillin-clavulanate 600-42.9 MG/5ML suspension  Commonly known as:  AUGMENTIN ES-600     clindamycin 100 MG vaginal suppository  Commonly known as:  CLEOCIN     HYDROcodone-acetaminophen 5-325 MG per tablet  Commonly known as:  NORCO     pregabalin 75 MG capsule  Commonly known as:  LYRICA      TAKE these medications        albuterol 108 (90 BASE) MCG/ACT inhaler  Commonly known as:  PROVENTIL HFA;VENTOLIN HFA  Inhale 2 puffs into the lungs every 4 (four) hours as needed for wheezing or shortness of breath.     atorvastatin 80 MG tablet  Commonly known as:  LIPITOR  Take 1 tablet (80 mg total) by mouth daily at 6 PM.     dicyclomine 20 MG tablet  Commonly known as:  BENTYL  Take 1 tablet (20 mg total) by mouth 2 (two) times daily.     gabapentin 300 MG capsule  Commonly known as:  NEURONTIN  Take 300 mg by mouth 3 (three) times daily.     insulin aspart 100 UNIT/ML injection  Commonly known as:  novoLOG  Sliding scale- If sugar 150-199: inject 2 unit;  200-249: 4 units, 250-299: 7 units; 300-349: 10 units; Over 350: 12 units     Insulin Glargine 100 UNIT/ML Solostar Pen  Commonly known as:  LANTUS SOLOSTAR  Inject 50 Units into the skin daily at 10 pm.     labetalol 100 MG tablet  Commonly known as:  NORMODYNE  Take 1 tablet (100 mg total) by mouth 2 (two) times daily.     LORazepam 1 MG tablet  Commonly known as:  ATIVAN  Take 1 mg by mouth every 8  (eight) hours as needed for anxiety.     naproxen sodium 220 MG tablet  Commonly known as:  ANAPROX  Take 440 mg by mouth 2 (two) times daily as needed (pain).     omeprazole 20 MG capsule  Commonly known as:  PRILOSEC  Take 1 capsule (20 mg total) by mouth daily.     ondansetron 8 MG disintegrating tablet  Commonly known as:  ZOFRAN ODT  Take 1 tablet (8 mg total) by mouth every 8 (eight) hours as needed for nausea or vomiting.     promethazine 25 MG suppository  Commonly known as:  PHENERGAN  Place 1 suppository (25 mg total) rectally every 6 (six) hours as needed for nausea or vomiting.     senna-docusate 8.6-50 MG per tablet  Commonly known as:  Senokot-S  Take 1 tablet by mouth daily.        Disposition and follow-up:   Shirley [name] was discharged from [facility] in Stable condition.  At the hospital follow up visit please address:  1.  Chest pain: active cocaine use, also exacerbated by nausea/vomiting.  2.  Gastroparesis, history H pylori gastritis: Was supposed to follow up with GI.  Will need to be referred back to GI. Consider H pylori stool antigen.  3.  DM2: poorly controlled. Has medication noncompliance.  4.  Labs / imaging needed at time of follow-up: None  5.  Pending labs/ test needing follow-up: None  Follow-up Appointments:     Follow-up Information    Follow up with Luan Moore, MD. Schedule an appointment as soon as possible for a visit in 1 week.   Specialty:  Internal Medicine   Why:  You will be scheduled for a follow up with the clinic in 1 week. Please expect a phone call either Monday or Tuesday. If you do not receive a call by then, please call the clinic to set up this appointment.   Contact information:   [place] [address] [phone]       Follow up with Gastroenterology. Schedule an appointment as soon as possible for a visit in 1 month.   Why:  Please remind your PCP or whoever you see for  hospital follow up to refer you to GI.      Discharge Instructions: Discharge Instructions    Call MD for:  difficulty breathing, headache or visual disturbances    Complete by:  As directed      Call MD for:  persistant dizziness or light-headedness    Complete by:  As directed      Call MD for:  persistant nausea and vomiting    Complete by:  As directed      Call MD for:  severe uncontrolled pain    Complete by:  As directed      Call MD for:  temperature >100.4    Complete by:  As directed      Diet - low sodium heart healthy    Complete by:  As directed      Increase activity slowly    Complete by:  As directed            Consultations:    Procedures Performed:  Dg Chest 2 View  04/03/2014   CLINICAL DATA:  Vomiting, weakness.  EXAM: CHEST  2 VIEW  COMPARISON:  March 11, 2014.  FINDINGS: The heart size and mediastinal contours are within normal limits. Both lungs are clear. No pneumothorax or pleural effusion is noted. The visualized skeletal structures are unremarkable.  IMPRESSION: No active cardiopulmonary disease.   Electronically Signed   By: Sabino Dick M.D.   On: 04/03/2014 12:15   Dg Chest 2 View  03/11/2014   CLINICAL DATA:  Left-sided chest pain for 2 days. Weakness since this evening.  EXAM: CHEST  2 VIEW  COMPARISON:  11/25/2013  FINDINGS: The heart size and mediastinal contours are within normal limits. Both lungs are clear. The visualized skeletal structures are unremarkable.  IMPRESSION: No active cardiopulmonary disease.   Electronically Signed   By: Lucienne Capers M.D.   On: 03/11/2014 21:51   US Abdomen Limited  04/03/2014   CLINICAL DATA:  38 year old female with right upper quadrant abdominal pain  EXAM: US ABDOMEN LIMITED - RIGHT UPPER QUADRANT  COMPARISON:  Prior CT abdomen/pelvis 12/31/2013  FINDINGS: Gallbladder:  Small amounts of minimally echogenic mobile material within the gallbladder lumen consistent with sludge. The gallbladder wall is not  thickened and there is no pericholecystic fluid. Per the sonographer, the sonographic Percell Miller sign was negative.  Common bile duct:  Diameter:  Within normal limits at 3 mm  Liver:  No focal lesion identified. Within normal limits  in parenchymal echogenicity. Main portal vein patent with hepatopetal flow.  IMPRESSION: 1. Trace gallbladder sludge without sonographic findings to suggest cholecystitis. 2. Otherwise, unremarkable right upper quadrant abdominal ultrasound.   Electronically Signed   By: Jacqulynn Cadet M.D.   On: 04/03/2014 13:00    Admission HPI: Shirley [name] is a 38 year old female with history of iron deficiency anemia, HTN, HLD, DM2, OSA, CAD s/p angioplasty in 2008, asthma, GERD, cocaine use, fibromyalgia presenting with chest pain, N/V, abdominal pain. She started having nausea/vomiting 2 days ago. She reports seeing some specks of blood/black in the emesis. Emesis x 6-7 today. The chest pain started shortly after. She describes it as squeezing, sharp, midsternal. Radiates to her left arm. Constant with intermittent increases in severity to 10/10 at worse every few hours. No exacerbating/relieving factors. Similar to chest pain with MI in 2008. She has associated diaphoresis, dizziness, and shortness of breath. Last used cocaine 5 days ago.  She has been on her menstrual period since Monday. She reports it is heavy, wears 2 pads at a time and changes it 5-6 times a day. +Clots. Her periods come once a month, are heavy and last 7 days.  She has chills but no subjective fevers. She reports lightheadedness with standing. Denies vision changes, cough, changes in chronic LLQ abdominal pain, diarrhea, hematochezia, melena, dysuria, hematuria, rash, bleeding/bruising, changes to chronic LE edema, localized weakness.  Multiple ED visits over last year with similar complaints. Last seen in ED 03/11/2014 with similar presentation. Her tachycardia improved with IV fluids. Pain was controlled  with dilaudid. Troponins were negative and EKG unremarkable.  Hospital Course by problem list: Active Problems:   Polysubstance abuse   Essential hypertension   Abdominal pain, left lower quadrant   NAUSEA AND VOMITING   Chest pain   1. Chest pain: History of CAD with angioplasty in 2008. She had a cath in 2010 that demonstrated no restenosis or significant CAD progression. Some tenderness to palpation of chest but otherwise not reproducible on palpation making MSK etiology less likely. She does have a history of GERD. CXR with no acute cardiopulmonary process making pneumothorax, aortic dissection, pericardial effusion, pneumonia, esophageal rupture less likely. Wells score 1.5 and Geneva score 5 (heart rate >100) making her low and moderate risk for PE, respectively. Last used cocaine 5 days ago. Initial troponin negative and EKG without acute ischemic changes. Her TIMI score was 2 (>3 CAD risk factors, known CAD) - 8% risk. Spoke with cardiologist, Dr. Einar Gip who said to consult if workup positive for ACS. She received ASA 324mg  in the ED. Troponins were negative x 3 and repeat EKG unremarkable. At discharge, she had no recurrence of her chest pain.  2. Nausea, vomiting, abdominal pain: history of gastroparesis, GERD, H pylori gastritis in 2011 (Rx with Pylera and omeprazole). She has had chronic nausea/vomiting, abdominal pain for the past 2 years. US abdomen unremarkable with trace gallbladder sludge without sonographic findings to suggest cholecystitis. Continued protonix 40mg  IV daily, home phenergan 12.5mg  Q6hr prn. She has not been able to follow up with GI and will need outpatient follow up. Her pain remained stable during hospitalization. There was no witnessed nausea/vomiting. She ate her entire breakfast tray the following morning. Discussed with patient that opioid pain medication would not be appropriate in the setting of gastroparesis.  3. AKI: Baseline cr 1. Admission Cr 1.18. Likely  2/2 volume depletion from vomiting, poor PO intake She was placed on NS@125ml /hr. Her Cr returned to  baseline the following morning.  Discharge Vitals:   BP 108/74 mmHg  Pulse 103  Temp(Src) 98.4 F (36.9 C) (Oral)  Resp 18  Wt 206 lb 5.6 oz (93.6 kg)  SpO2 100%  LMP 02/25/2014  Discharge Labs:  Results for orders placed or performed during the hospital encounter of 04/03/14 (from the past 24 hour(s))  Troponin I (q 6hr x 3)     Status: None   Collection Time: 04/03/14  3:20 PM  Result Value Ref Range   Troponin I <0.30 <0.30 ng/mL  Glucose, capillary     Status: Abnormal   Collection Time: 04/03/14  5:09 PM  Result Value Ref Range   Glucose-Capillary 188 (H) 70 - 99 mg/dL  Type and screen     Status: None   Collection Time: 04/03/14  6:58 PM  Result Value Ref Range   ABO/RH(D) A POS    Antibody Screen NEG    Sample Expiration 04/06/2014   ABO/Rh     Status: None   Collection Time: 04/03/14  6:58 PM  Result Value Ref Range   ABO/RH(D) A POS   Troponin I (q 6hr x 3)     Status: None   Collection Time: 04/03/14  9:13 PM  Result Value Ref Range   Troponin I <0.30 <0.30 ng/mL  Glucose, capillary     Status: Abnormal   Collection Time: 04/03/14  9:27 PM  Result Value Ref Range   Glucose-Capillary 271 (H) 70 - 99 mg/dL  Troponin I (q 6hr x 3)     Status: None   Collection Time: 04/04/14  2:38 AM  Result Value Ref Range   Troponin I <0.30 <0.30 ng/mL  Basic metabolic panel     Status: Abnormal   Collection Time: 04/04/14  2:38 AM  Result Value Ref Range   Sodium 137 137 - 147 mEq/L   Potassium 3.9 3.7 - 5.3 mEq/L   Chloride 104 96 - 112 mEq/L   CO2 20 19 - 32 mEq/L   Glucose, Bld 237 (H) 70 - 99 mg/dL   BUN 13 6 - 23 mg/dL   Creatinine, Ser 0.82 0.50 - 1.10 mg/dL   Calcium 8.7 8.4 - 10.5 mg/dL   GFR calc non Af Amer 88 (L) >90 mL/min   GFR calc Af Amer >90 >90 mL/min   Anion gap 13 5 - 15  Glucose, capillary     Status: Abnormal   Collection Time: 04/04/14  6:11 AM   Result Value Ref Range   Glucose-Capillary 325 (H) 70 - 99 mg/dL  CBC     Status: Abnormal   Collection Time: 04/04/14  9:04 AM  Result Value Ref Range   WBC 9.7 4.0 - 10.5 K/uL   RBC 4.24 3.87 - 5.11 MIL/uL   Hemoglobin 7.9 (L) 12.0 - 15.0 g/dL   HCT 26.0 (L) 36.0 - 46.0 %   MCV 61.3 (L) 78.0 - 100.0 fL   MCH 18.6 (L) 26.0 - 34.0 pg   MCHC 30.4 30.0 - 36.0 g/dL   RDW 18.5 (H) 11.5 - 15.5 %   Platelets 483 (H) 150 - 400 K/uL  Glucose, capillary     Status: Abnormal   Collection Time: 04/04/14 11:31 AM  Result Value Ref Range   Glucose-Capillary 290 (H) 70 - 99 mg/dL   Comment 1 Notify RN    Comment 2 Documented in Chart     Signed: Jacques Earthly, MD 04/04/2014, 2:06 PM    Services Ordered on Discharge:  none Equipment Ordered on Discharge: none

## 2014-04-04 NOTE — Progress Notes (Signed)
Pt discharged home Discharge instructions given & reviewed Eduction discussed  IV dc'd  Tele dc'd  Pt discharged via wheelchair with RN.  All patient belongs at side. Shirley [name] 5:54 PM

## 2014-04-04 NOTE — Progress Notes (Signed)
UR completed 

## 2014-04-14 ENCOUNTER — Ambulatory Visit: Payer: Medicaid Other | Admitting: Internal Medicine

## 2014-04-21 ENCOUNTER — Inpatient Hospital Stay (HOSPITAL_COMMUNITY)
Admission: EM | Admit: 2014-04-21 | Discharge: 2014-04-26 | DRG: 871 | Disposition: A | Discharge status: Home / Self Care | Payer: Medicaid Other | Attending: Internal Medicine | Admitting: Internal Medicine

## 2014-04-21 ENCOUNTER — Emergency Department (HOSPITAL_COMMUNITY): Payer: Medicaid Other

## 2014-04-21 ENCOUNTER — Encounter (HOSPITAL_COMMUNITY): Payer: Self-pay | Admitting: Emergency Medicine

## 2014-04-21 DIAGNOSIS — Z79899 Other long term (current) drug therapy: Secondary | ICD-10-CM

## 2014-04-21 DIAGNOSIS — I252 Old myocardial infarction: Secondary | ICD-10-CM

## 2014-04-21 DIAGNOSIS — J189 Pneumonia, unspecified organism: Secondary | ICD-10-CM | POA: Diagnosis present

## 2014-04-21 DIAGNOSIS — F1910 Other psychoactive substance abuse, uncomplicated: Secondary | ICD-10-CM | POA: Diagnosis present

## 2014-04-21 DIAGNOSIS — E1140 Type 2 diabetes mellitus with diabetic neuropathy, unspecified: Secondary | ICD-10-CM | POA: Diagnosis present

## 2014-04-21 DIAGNOSIS — D509 Iron deficiency anemia, unspecified: Secondary | ICD-10-CM | POA: Diagnosis present

## 2014-04-21 DIAGNOSIS — A419 Sepsis, unspecified organism: Principal | ICD-10-CM | POA: Diagnosis present

## 2014-04-21 DIAGNOSIS — B181 Chronic viral hepatitis B without delta-agent: Secondary | ICD-10-CM | POA: Diagnosis present

## 2014-04-21 DIAGNOSIS — G4733 Obstructive sleep apnea (adult) (pediatric): Secondary | ICD-10-CM | POA: Diagnosis present

## 2014-04-21 DIAGNOSIS — F418 Other specified anxiety disorders: Secondary | ICD-10-CM | POA: Diagnosis present

## 2014-04-21 DIAGNOSIS — K3184 Gastroparesis: Secondary | ICD-10-CM | POA: Diagnosis present

## 2014-04-21 DIAGNOSIS — I951 Orthostatic hypotension: Secondary | ICD-10-CM | POA: Diagnosis not present

## 2014-04-21 DIAGNOSIS — I259 Chronic ischemic heart disease, unspecified: Secondary | ICD-10-CM | POA: Diagnosis present

## 2014-04-21 DIAGNOSIS — E1165 Type 2 diabetes mellitus with hyperglycemia: Secondary | ICD-10-CM | POA: Diagnosis present

## 2014-04-21 DIAGNOSIS — F341 Dysthymic disorder: Secondary | ICD-10-CM | POA: Diagnosis present

## 2014-04-21 DIAGNOSIS — G8929 Other chronic pain: Secondary | ICD-10-CM | POA: Diagnosis present

## 2014-04-21 DIAGNOSIS — R079 Chest pain, unspecified: Secondary | ICD-10-CM

## 2014-04-21 DIAGNOSIS — D500 Iron deficiency anemia secondary to blood loss (chronic): Secondary | ICD-10-CM | POA: Diagnosis present

## 2014-04-21 DIAGNOSIS — Y95 Nosocomial condition: Secondary | ICD-10-CM | POA: Diagnosis present

## 2014-04-21 DIAGNOSIS — E860 Dehydration: Secondary | ICD-10-CM

## 2014-04-21 DIAGNOSIS — R112 Nausea with vomiting, unspecified: Secondary | ICD-10-CM | POA: Diagnosis present

## 2014-04-21 DIAGNOSIS — I10 Essential (primary) hypertension: Secondary | ICD-10-CM | POA: Diagnosis present

## 2014-04-21 DIAGNOSIS — M797 Fibromyalgia: Secondary | ICD-10-CM | POA: Diagnosis present

## 2014-04-21 DIAGNOSIS — Z794 Long term (current) use of insulin: Secondary | ICD-10-CM

## 2014-04-21 DIAGNOSIS — E119 Type 2 diabetes mellitus without complications: Secondary | ICD-10-CM | POA: Diagnosis present

## 2014-04-21 DIAGNOSIS — E785 Hyperlipidemia, unspecified: Secondary | ICD-10-CM | POA: Diagnosis present

## 2014-04-21 DIAGNOSIS — R109 Unspecified abdominal pain: Secondary | ICD-10-CM

## 2014-04-21 DIAGNOSIS — E1142 Type 2 diabetes mellitus with diabetic polyneuropathy: Secondary | ICD-10-CM | POA: Diagnosis present

## 2014-04-21 DIAGNOSIS — J45909 Unspecified asthma, uncomplicated: Secondary | ICD-10-CM | POA: Diagnosis present

## 2014-04-21 DIAGNOSIS — E1143 Type 2 diabetes mellitus with diabetic autonomic (poly)neuropathy: Secondary | ICD-10-CM

## 2014-04-21 DIAGNOSIS — R739 Hyperglycemia, unspecified: Secondary | ICD-10-CM

## 2014-04-21 DIAGNOSIS — D473 Essential (hemorrhagic) thrombocythemia: Secondary | ICD-10-CM | POA: Diagnosis present

## 2014-04-21 DIAGNOSIS — I959 Hypotension, unspecified: Secondary | ICD-10-CM | POA: Diagnosis present

## 2014-04-21 DIAGNOSIS — Z8673 Personal history of transient ischemic attack (TIA), and cerebral infarction without residual deficits: Secondary | ICD-10-CM

## 2014-04-21 DIAGNOSIS — K219 Gastro-esophageal reflux disease without esophagitis: Secondary | ICD-10-CM | POA: Diagnosis present

## 2014-04-21 HISTORY — DX: Cerebral infarction, unspecified: I63.9

## 2014-04-21 HISTORY — DX: Other asthma: J45.998

## 2014-04-21 HISTORY — DX: Pneumonia, unspecified organism: J18.9

## 2014-04-21 LAB — CBC WITH DIFFERENTIAL/PLATELET
Basophils Absolute: 0 10*3/uL (ref 0.0–0.1)
Basophils Relative: 0 % (ref 0–1)
Eosinophils Absolute: 0.3 10*3/uL (ref 0.0–0.7)
Eosinophils Relative: 2 % (ref 0–5)
HCT: 30.2 % — ABNORMAL LOW (ref 36.0–46.0)
Hemoglobin: 9.6 g/dL — ABNORMAL LOW (ref 12.0–15.0)
Lymphocytes Relative: 20 % (ref 12–46)
Lymphs Abs: 3 10*3/uL (ref 0.7–4.0)
MCH: 19.4 pg — ABNORMAL LOW (ref 26.0–34.0)
MCHC: 31.8 g/dL (ref 30.0–36.0)
MCV: 60.9 fL — ABNORMAL LOW (ref 78.0–100.0)
Monocytes Absolute: 1.2 10*3/uL — ABNORMAL HIGH (ref 0.1–1.0)
Monocytes Relative: 8 % (ref 3–12)
Neutro Abs: 10.4 10*3/uL — ABNORMAL HIGH (ref 1.7–7.7)
Neutrophils Relative %: 70 % (ref 43–77)
Platelets: 512 10*3/uL — ABNORMAL HIGH (ref 150–400)
RBC: 4.96 MIL/uL (ref 3.87–5.11)
RDW: 18.7 % — ABNORMAL HIGH (ref 11.5–15.5)
WBC: 14.9 10*3/uL — ABNORMAL HIGH (ref 4.0–10.5)

## 2014-04-21 LAB — COMPREHENSIVE METABOLIC PANEL
ALBUMIN: 2.9 g/dL — AB (ref 3.5–5.2)
ALT: 7 U/L (ref 0–35)
AST: 16 U/L (ref 0–37)
Alkaline Phosphatase: 60 U/L (ref 39–117)
Anion gap: 10 (ref 5–15)
BUN: 11 mg/dL (ref 6–23)
CALCIUM: 9.1 mg/dL (ref 8.4–10.5)
CO2: 24 mmol/L (ref 19–32)
CREATININE: 1.05 mg/dL (ref 0.50–1.10)
Chloride: 99 mEq/L (ref 96–112)
GFR calc Af Amer: 76 mL/min — ABNORMAL LOW (ref >90)
GFR, EST NON AFRICAN AMERICAN: 66 mL/min — AB (ref >90)
Glucose, Bld: 415 mg/dL — ABNORMAL HIGH (ref 70–99)
Potassium: 3.8 mmol/L (ref 3.5–5.1)
SODIUM: 133 mmol/L — AB (ref 135–145)
TOTAL PROTEIN: 7.7 g/dL (ref 6.0–8.3)
Total Bilirubin: 0.4 mg/dL (ref 0.3–1.2)

## 2014-04-21 LAB — LIPASE, BLOOD: Lipase: 36 U/L (ref 11–59)

## 2014-04-21 LAB — I-STAT TROPONIN, ED: TROPONIN I, POC: 0 ng/mL (ref 0.00–0.08)

## 2014-04-21 LAB — I-STAT BETA HCG BLOOD, ED (MC, WL, AP ONLY)

## 2014-04-21 LAB — I-STAT CG4 LACTIC ACID, ED: Lactic Acid, Venous: 2.8 mmol/L — ABNORMAL HIGH (ref 0.5–2.2)

## 2014-04-21 MED ORDER — SODIUM CHLORIDE 0.9 % IV BOLUS (SEPSIS)
1000.0000 mL | Freq: Once | INTRAVENOUS | Status: AC
  Administered 2014-04-22: 500 mL via INTRAVENOUS

## 2014-04-21 MED ORDER — SODIUM CHLORIDE 0.9 % IV BOLUS (SEPSIS)
1000.0000 mL | Freq: Once | INTRAVENOUS | Status: AC
  Administered 2014-04-21: 1000 mL via INTRAVENOUS

## 2014-04-21 MED ORDER — ASPIRIN 81 MG PO CHEW
324.0000 mg | CHEWABLE_TABLET | Freq: Once | ORAL | Status: AC
  Administered 2014-04-21: 324 mg via ORAL
  Filled 2014-04-21: qty 4

## 2014-04-21 MED ORDER — LEVOFLOXACIN IN D5W 750 MG/150ML IV SOLN
750.0000 mg | Freq: Once | INTRAVENOUS | Status: DC
  Filled 2014-04-21: qty 150

## 2014-04-21 NOTE — ED Provider Notes (Signed)
CSN: [id number]     Arrival date & time 04/21/14  1812 History   First MD Initiated Contact with Patient 04/21/14 2219     Chief Complaint  Patient presents with  . Chest Pain      Patient is a 38 y.o. female presenting with vomiting. The history is provided by the patient.  Emesis Severity:  Moderate Duration:  4 days Timing:  Intermittent Progression:  Worsening Chronicity:  New Relieved by:  None tried Worsened by:  Nothing tried Associated symptoms: abdominal pain   Associated symptoms: no diarrhea and no fever   Patient reports over 4 days ago she had onset of nonbloody vomiting The following day she had began having CP - similar to prior episodes of CP She also reports diffuse abdominal pain after the vomiting She denies diarrhea  She denies recent drug use  Past Medical History  Diagnosis Date  . Thrombocytosis     Hem/Onc suggested 2/2 chronic hepatits and/or iron deficiency anemia  . Iron deficiency anemia   . N&V (nausea and vomiting)     Chronic. Unclear etiology with multiple admission and ED visits. CT abdomen with and without contrast (02/2011)  showed no acute process. Gastic Emptying scan (01/2010) was normal. Ultrasound of the abdomen was within normal limits. Hepatitis B viral load was undectable. HIV NR. EGD - gastritis, Hpylori + s/p Rx  . Hypertension   . Hyperlipidemia   . Diabetes mellitus type 2, uncontrolled, with complications   . Recurrent boils   . Back pain   . OSA (obstructive sleep apnea)   . 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 Cath 12/10- no restenosis or significant CAD progression  . Irregular menses     Small ovarian follicles seen on IO(9/62)  . History of pyelonephritis     H/o GrpB Pyelonephritis (9/06) and UTI- 07/11- E.Coli, 12/10- GBS  . Abscess of tunica vaginalis     10/09- Abundant S. aureus- sensitive to  all abx  . Obesity   . Asthma   . Gastritis   . Depression   . Hepatitis B, chronic     Hep BeAb+,Hep B cAb+ & Hep BsAg+ (9/06)  . Peripheral neuropathy   . Fibromyalgia   . GERD (gastroesophageal reflux disease)   . Migraine   . Anxiety   . Gastroparesis     secondary to poorly controlled DM, last emptying study performed 01/2010  was normal but may be falsely positive as pt was on reglan  . Blood dyscrasia   . H/O: CVA (cerebrovascular accident)     history of remote right cerebellar infarct noted on head CT at least since 10/2011  . MI (myocardial infarction)   . Substance abuse   . Cancer     pt states no hx ca 12/31/13   Past Surgical History  Procedure Laterality Date  . Cesarean section  1997   Family History  Problem Relation Age of Onset  . Diabetes Father    History  Substance Use Topics  . Smoking status: Former Smoker    Types: Cigarettes    Quit date: 04/24/1996  . Smokeless tobacco: Never Used     Comment: quit smoking cigarettes age 77  . Alcohol Use: Yes     Comment: occasionally   OB History    No data available     Review of Systems  Constitutional: Negative  for fever.  Respiratory: Positive for shortness of breath.   Cardiovascular: Positive for chest pain.  Gastrointestinal: Positive for vomiting and abdominal pain. Negative for diarrhea.  Neurological: Positive for weakness.  All other systems reviewed and are negative.     Allergies  Lisinopril and Morphine and related  Home Medications   Prior to Admission medications   Medication Sig Start Date End Date Taking? Authorizing Provider  albuterol (PROVENTIL HFA;VENTOLIN HFA) 108 (90 BASE) MCG/ACT inhaler Inhale 2 puffs into the lungs every 4 (four) hours as needed for wheezing or shortness of breath.   Yes Historical Provider, MD  gabapentin (NEURONTIN) 300 MG capsule Take 300 mg by mouth 3 (three) times daily.    Yes Historical Provider, MD  insulin aspart (NOVOLOG) 100 UNIT/ML  injection Sliding scale- If sugar 150-199: inject 2 unit;  200-249: 4 units, 250-299: 7 units; 300-349: 10 units; Over 350: 12 units Patient taking differently: Inject 2-12 Units into the skin 3 (three) times daily with meals. Sliding scale- If sugar 150-199: inject 2 unit;  200-249: 4 units, 250-299: 7 units; 300-349: 10 units; Over 350: 12 units 11/11/13  Yes Bartholomew Crews, MD  Insulin Glargine (LANTUS SOLOSTAR) 100 UNIT/ML Solostar Pen Inject 50 Units into the skin daily at 10 pm. 11/11/13  Yes Bartholomew Crews, MD  labetalol (NORMODYNE) 100 MG tablet Take 1 tablet (100 mg total) by mouth 2 (two) times daily. 03/05/14  Yes Karren Cobble, MD  LORazepam (ATIVAN) 1 MG tablet Take 1 mg by mouth every 8 (eight) hours as needed for anxiety.   Yes Historical Provider, MD  naproxen sodium (ANAPROX) 220 MG tablet Take 440 mg by mouth 2 (two) times daily as needed (pain).   Yes Historical Provider, MD  omeprazole (PRILOSEC) 20 MG capsule Take 1 capsule (20 mg total) by mouth daily. 12/31/13  Yes Hoy Morn, MD  promethazine (PHENERGAN) 25 MG suppository Place 1 suppository (25 mg total) rectally every 6 (six) hours as needed for nausea or vomiting. 04/04/14  Yes Jacques Earthly, MD  senna-docusate (SENOKOT-S) 8.6-50 MG per tablet Take 1 tablet by mouth daily.   Yes Historical Provider, MD  atorvastatin (LIPITOR) 80 MG tablet Take 1 tablet (80 mg total) by mouth daily at 6 PM. Patient not taking: Reported on 03/11/2014 07/25/13   Dixon Boos, MD  dicyclomine (BENTYL) 20 MG tablet Take 1 tablet (20 mg total) by mouth 2 (two) times daily. Patient not taking: Reported on 04/03/2014 12/31/13   Hoy Morn, MD   BP 114/72 mmHg  Pulse 97  Temp(Src) 98.6 F (37 C) (Oral)  Resp 16  SpO2 100%  LMP 03/24/2014 Physical Exam CONSTITUTIONAL: Well developed/well nourished HEAD: Normocephalic/atraumatic EYES: EOMI/PERRL ENMT: Mucous membranes dry NECK: supple no meningeal signs SPINE/BACK:entire  spine nontender CV: S1/S2 noted, no murmurs/rubs/gallops noted LUNGS: Lungs are clear to auscultation bilaterally, no apparent distress ABDOMEN: soft, mild diffuse tenderness noted, no rebound or guarding, bowel sounds noted throughout abdomen GU:no cva tenderness NEURO: Pt is awake/alert/appropriate, moves all extremitiesx4.  No facial droop.   EXTREMITIES: pulses normal/equal, full ROM SKIN: warm, color normal PSYCH: no abnormalities of mood noted, alert and oriented to situation  ED Course  Procedures   11:35 PM I was called to room as pt was noted to be hypotensive While in room manual BP was in 60s.  She was tachycardic.  She was awake/alert.   After IV established and fluids started her vitals improved She is now improved,  however reports continued CP similar to prior episodes Her abdomen is soft, I doubt acute abdominal process Due to her chest pain and also episode of hypotension will admit Also noted to have possible pneumonia with elevated WBC - antibiotics ordered D/w internal medicine will admit BP 114/72 mmHg  Pulse 97  Temp(Src) 98.6 F (37 C) (Oral)  Resp 16  SpO2 100%  LMP 03/24/2014   Labs Review Labs Reviewed  CBC WITH DIFFERENTIAL - Abnormal; Notable for the following:    WBC 14.9 (*)    Hemoglobin 9.6 (*)    HCT 30.2 (*)    MCV 60.9 (*)    MCH 19.4 (*)    RDW 18.7 (*)    Platelets 512 (*)    Neutro Abs 10.4 (*)    Monocytes Absolute 1.2 (*)    All other components within normal limits  COMPREHENSIVE METABOLIC PANEL - Abnormal; Notable for the following:    Sodium 133 (*)    Glucose, Bld 415 (*)    Albumin 2.9 (*)    GFR calc non Af Amer 66 (*)    GFR calc Af Amer 76 (*)    All other components within normal limits  I-STAT CG4 LACTIC ACID, ED - Abnormal; Notable for the following:    Lactic Acid, Venous 2.80 (*)    All other components within normal limits  LIPASE, BLOOD  I-STAT TROPOININ, ED  I-STAT BETA HCG BLOOD, ED ([facility], [facility], [facility] ONLY)     Imaging Review Dg Chest 2 View  04/21/2014   CLINICAL DATA:  Chest pain for 3 days  EXAM: CHEST  2 VIEW  COMPARISON:  04/03/2014  FINDINGS: Heart size and mediastinal contours are normal. Developing opacity within the left upper lobe in the projection of the anterior first rib is identified and appears increased from 04/03/2014. Right lung is clear. No pleural effusion identified.  IMPRESSION: 1. Developing a left upper lobe opacity is likely infectious in etiology. Radiographic followup is recommended to ensure resolution.   Electronically Signed   By: Kerby Moors M.D.   On: 04/21/2014 19:10     EKG Interpretation   Date/Time:  Tuesday April 21 2014 18:17:28 EST Ventricular Rate:  137 PR Interval:  114 QRS Duration: 70 QT Interval:  304 QTC Calculation: 459 R Axis:   56 Text Interpretation:  Sinus tachycardia Nonspecific T wave abnormality  Abnormal ECG Confirmed by Christy Gentles  MD, Elenore Rota ([id number]) on 04/21/2014  10:19:05 PM      MDM   Final diagnoses:  Chest pain, rule out acute myocardial infarction  Dehydration  Hyperglycemia  CAP (community acquired pneumonia)    Nursing notes including past medical history and social history reviewed and considered in documentation Labs/vital reviewed myself and considered during evaluation xrays/imaging reviewed by myself and considered during evaluation     Sharyon Cable, MD 04/21/14 2337

## 2014-04-21 NOTE — ED Notes (Signed)
Pt c/o N/V x 3 days with CP; pt noted to be tachycardic

## 2014-04-21 NOTE — ED Notes (Signed)
CG-4 reported to Dr. Christy Gentles

## 2014-04-21 NOTE — H&P (Signed)
Date: 04/22/2014               Patient Name:  Shirley [name] MRN: [id number]  DOB: 05-21-1975 Age / Sex: 38 y.o., female   PCP: Luan Moore, MD              Medical Service: Internal Medicine Teaching Service              Attending Physician: Dr. Axel Filler, MD    First Contact: Dr. Marvel Plan Pager: [phone]  Second Contact: Dr. Gordy Levan Pager: [phone]            After Hours (After 5p/  First Contact Pager: [phone]  weekends / holidays): Second Contact Pager: [phone]   Chief Complaint:  Chest pain  History of Present Illness: Shirley [name] is a 38 year old woman with history of iron deficiency anemia secondary to menorrhagia, HTN, HLD, DM2 with peripheral neuropathy and gastroparesis, H pylori gastritis in 2011, OSA, CAD s/p angioplasty in 2008, ashtma, GERD, cocaine use, and fibromyalgia presenting with chest pain associated with nausea and vomiting.  She has had chronic nausea and vomiting for the past 2 years, and has multiple similar presentations to the ER in the past year and was admitted with similar symptoms on 04/03/14.  UDS was positive for cocaine and THC at that time and chest pain work-up was unremarkable.  She says that she started having non-bloody emesis 4 days ago with diffuse abdominal pain following the vomiting.  She says she developed chest pain the following day that is similar to her previous episodes.  It is substernal and feels like sharp and squeezing and is associated with shortness of breath.  It is worse with deep breaths.  She also reports a non-productive cough for the past couple of weeks that has gotten worse the past couple of days.  She has been having chills and has been diaphoretic and light-headed, but she denies fevers.  She denies diarrhea, melena, or hematochezia and says her last bowel movement was yesterday afternoon.  She says she last used cocaine a week ago.  Her most recent cath was in 2010 and demonstrated no restenosis or  significant CAD progression.  In the ER, she was noted to be tachycardic to 128.  She had an episode of hypotension to the 42A systolic and was reportedly groggy per the ER physician.  She was given a 1 L NS bolus with improvement in her blood pressure.  Review of Systems: Review of Systems  Constitutional: Positive for chills, weight loss, malaise/fatigue and diaphoresis. Negative for fever.  HENT: Negative for congestion and sore throat.   Eyes: Negative for blurred vision.  Respiratory: Positive for cough and shortness of breath. Negative for hemoptysis, sputum production and wheezing.   Cardiovascular: Positive for chest pain. Negative for palpitations, orthopnea and leg swelling.  Gastrointestinal: Positive for nausea, vomiting and abdominal pain. Negative for diarrhea, constipation, blood in stool and melena.  Genitourinary: Negative for dysuria.  Musculoskeletal: Negative for myalgias and joint pain.  Skin: Negative for rash.  Neurological: Positive for dizziness and weakness. Negative for sensory change, focal weakness and headaches.    Meds:  (Not in a hospital admission) Current Facility-Administered Medications  Medication Dose Route Frequency Provider Last Rate Last Dose  . ceFEPIme (MAXIPIME) 1 g in dextrose 5 % 50 mL IVPB  1 g Intravenous Q8H Axel Filler, MD      . insulin aspart (novoLOG) injection 0-20 Units  0-20 Units  Subcutaneous TID WC Marjan Rabbani, MD      . insulin glargine (LANTUS) injection 50 Units  50 Units Subcutaneous QHS Marjan Rabbani, MD      . metoCLOPramide (REGLAN) injection 5 mg  5 mg Intravenous Q6H PRN Juluis Mire, MD   5 mg at 04/22/14 0038  . vancomycin (VANCOCIN) 1,500 mg in sodium chloride 0.9 % 500 mL IVPB  1,500 mg Intravenous Once Axel Filler, MD      . vancomycin (VANCOCIN) IVPB 1000 mg/200 mL premix  1,000 mg Intravenous Q8H Axel Filler, MD      . [DISCONTINUED] levofloxacin [facility]) IVPB 750 mg  750 mg Intravenous Once  Sharyon Cable, MD 100 mL/hr at 04/22/14 0042 750 mg at 04/22/14 8676   Current Outpatient Prescriptions  Medication Sig Dispense Refill  . albuterol (PROVENTIL HFA;VENTOLIN HFA) 108 (90 BASE) MCG/ACT inhaler Inhale 2 puffs into the lungs every 4 (four) hours as needed for wheezing or shortness of breath.    . gabapentin (NEURONTIN) 300 MG capsule Take 300 mg by mouth 3 (three) times daily.     . insulin aspart (NOVOLOG) 100 UNIT/ML injection Sliding scale- If sugar 150-199: inject 2 unit;  200-249: 4 units, 250-299: 7 units; 300-349: 10 units; Over 350: 12 units (Patient taking differently: Inject 2-12 Units into the skin 3 (three) times daily with meals. Sliding scale- If sugar 150-199: inject 2 unit;  200-249: 4 units, 250-299: 7 units; 300-349: 10 units; Over 350: 12 units) 10 mL 2  . Insulin Glargine (LANTUS SOLOSTAR) 100 UNIT/ML Solostar Pen Inject 50 Units into the skin daily at 10 pm. 15 mL 2  . labetalol (NORMODYNE) 100 MG tablet Take 1 tablet (100 mg total) by mouth 2 (two) times daily. 180 tablet 3  . LORazepam (ATIVAN) 1 MG tablet Take 1 mg by mouth every 8 (eight) hours as needed for anxiety.    . naproxen sodium (ANAPROX) 220 MG tablet Take 440 mg by mouth 2 (two) times daily as needed (pain).    Marland Kitchen omeprazole (PRILOSEC) 20 MG capsule Take 1 capsule (20 mg total) by mouth daily. 30 capsule 0  . promethazine (PHENERGAN) 25 MG suppository Place 1 suppository (25 mg total) rectally every 6 (six) hours as needed for nausea or vomiting. 12 each 0  . senna-docusate (SENOKOT-S) 8.6-50 MG per tablet Take 1 tablet by mouth daily.    Marland Kitchen atorvastatin (LIPITOR) 80 MG tablet Take 1 tablet (80 mg total) by mouth daily at 6 PM. (Patient not taking: Reported on 03/11/2014) 30 tablet 2  . dicyclomine (BENTYL) 20 MG tablet Take 1 tablet (20 mg total) by mouth 2 (two) times daily. (Patient not taking: Reported on 04/03/2014) 20 tablet 0    Allergies: Allergies as of 04/21/2014 - Review Complete  04/21/2014  Allergen Reaction Noted  . Lisinopril Nausea Only 03/17/2008  . Morphine and related Itching 04/15/2013   Past Medical History  Diagnosis Date  . Thrombocytosis     Hem/Onc suggested 2/2 chronic hepatits and/or iron deficiency anemia  . Iron deficiency anemia   . N&V (nausea and vomiting)     Chronic. Unclear etiology with multiple admission and ED visits. CT abdomen with and without contrast (02/2011)  showed no acute process. Gastic Emptying scan (01/2010) was normal. Ultrasound of the abdomen was within normal limits. Hepatitis B viral load was undectable. HIV NR. EGD - gastritis, Hpylori + s/p Rx  . Hypertension   . Hyperlipidemia   . Diabetes mellitus type  2, uncontrolled, with complications   . Recurrent boils   . Back pain   . OSA (obstructive sleep apnea)   . 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 Cath 12/10- no restenosis or significant CAD progression  . Irregular menses     Small ovarian follicles seen on ZO(1/09)  . History of pyelonephritis     H/o GrpB Pyelonephritis (9/06) and UTI- 07/11- E.Coli, 12/10- GBS  . Abscess of tunica vaginalis     10/09- Abundant S. aureus- sensitive to all abx  . Obesity   . Asthma   . Gastritis   . Depression   . Hepatitis B, chronic     Hep BeAb+,Hep B cAb+ & Hep BsAg+ (9/06)  . Peripheral neuropathy   . Fibromyalgia   . GERD (gastroesophageal reflux disease)   . Migraine   . Anxiety   . Gastroparesis     secondary to poorly controlled DM, last emptying study performed 01/2010  was normal but may be falsely positive as pt was on reglan  . Blood dyscrasia   . H/O: CVA (cerebrovascular accident)     history of remote right cerebellar infarct noted on head CT at least since 10/2011  . MI (myocardial infarction)   . Substance abuse   . Cancer     pt states no hx ca 12/31/13   Past Surgical History    Procedure Laterality Date  . Cesarean section  1997   Family History  Problem Relation Age of Onset  . Diabetes Father    History   Social History  . Marital Status: Single    Spouse Name: N/A    Number of Children: 2  . Years of Education: 11   Occupational History  . other     unemployed   Social History Main Topics  . Smoking status: Former Smoker    Types: Cigarettes    Quit date: 04/24/1996  . Smokeless tobacco: Never Used     Comment: quit smoking cigarettes age 55  . Alcohol Use: Yes     Comment: occasionally  . Drug Use: Yes    Special: Marijuana, Cocaine     Comment: Cocaine  . Sexual Activity: Yes   Other Topics Concern  . 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   Also works at [employer] having to lift elderly individuals.          Physical Exam: Filed Vitals:   04/22/14 0000  BP: 122/86  Pulse: 104  Temp: 98.6  Resp: 14  SpO2: 100% on RA.  Physical Exam  Constitutional: She is oriented to person, place, and time and well-developed, well-nourished, and in no distress. No distress.  HENT:  Head: Normocephalic and atraumatic.  Eyes: Conjunctivae and EOM are normal. Pupils are equal, round, and reactive to light. No scleral icterus.  Neck: Normal range of motion. Neck supple.  Cardiovascular: Regular rhythm and normal heart sounds.   Tachycardic.  Pulmonary/Chest: Effort normal. No respiratory distress. She has no wheezes. She has no rales. She exhibits tenderness (Mild.).  Slightly decreased breath sounds in left upper lung fields.  Abdominal: Soft. Bowel sounds are normal. She exhibits no distension and no mass. There is tenderness (Diffuse, L>R.). There is no rebound and no guarding.  Musculoskeletal: Normal range  of motion. She exhibits no edema or tenderness.  Neurological: She is alert and oriented to person, place, and time. No cranial nerve  deficit. She exhibits normal muscle tone.  Skin: Skin is warm and dry. No rash noted. She is not diaphoretic. No erythema.    Lab results: Basic Metabolic Panel:  Recent Labs  04/21/14 1820  NA 133*  K 3.8  CL 99  CO2 24  GLUCOSE 415*  BUN 11  CREATININE 1.05  CALCIUM 9.1   Liver Function Tests:  Recent Labs  04/21/14 1820  AST 16  ALT 7  ALKPHOS 60  BILITOT 0.4  PROT 7.7  ALBUMIN 2.9*    Recent Labs  04/21/14 1820  LIPASE 36   CBC:  Recent Labs  04/21/14 1820  WBC 14.9*  NEUTROABS 10.4*  HGB 9.6*  HCT 30.2*  MCV 60.9*  PLT 512*    Imaging results:  Dg Chest 2 View  04/21/2014   CLINICAL DATA:  Chest pain for 3 days  EXAM: CHEST  2 VIEW  COMPARISON:  04/03/2014  FINDINGS: Heart size and mediastinal contours are normal. Developing opacity within the left upper lobe in the projection of the anterior first rib is identified and appears increased from 04/03/2014. Right lung is clear. No pleural effusion identified.  IMPRESSION: 1. Developing a left upper lobe opacity is likely infectious in etiology. Radiographic followup is recommended to ensure resolution.   Electronically Signed   By: Kerby Moors M.D.   On: 04/21/2014 19:10   Other results: EKG: sinus tachycardia, T wave inversion in multiple leads, q waves in III and aVR.  Assessment & Plan by Problem: Active Problems:   * No active hospital problems. *   #HCAP with sepsis Tachycardic on admission with elevated WBC and LUL opacity on chest x-ray.  Unclear if this is the etiology of her chest pain, but she will require treatment for HCAP given her recent hospitalization. Received antibiotics for otitis externa on 01/12/14.  Started on Levaquin in the ER, but given her drop in blood pressure, possible risk for multidrug resistance, and QTc of 450 will switch to broader coverage with cefepime and vancomycin.  Given CAD history will rule out ACS.  Cocaine use could also be contributing to chest pain.   Received aspirin in the ER. -Admit to telemetry. -Switch to cefepime and vancomycin. -Additional 1L NS bolus. -Blood cultures. -Check procalcitonin. -Check strep pneumo and legionella antigens. -Cycle troponins. -Repeat EKG in the morning. -Urinalysis and UDS. -Ethanol level. -Hold labetalol in setting of hypotension.  #Nausea, vomiting, abdominal pain This is a chronic issue for her most likely secondary to gastroparesis, though gastric emptying study was negative in the past while on Reglan.  Does have history of H pylori gastritis, but she was treated previously. -Reglan PRN for nausea and vomiting given borderline QTc. -Continue PPI. -Check prealbumin to assess nutritional status.  #Iron deficiency anemia Baseline around 9-10. Admission Hgb 9.6. Ferritin 7 in 07/25/13, but not taking iron.  Thrombocytosis thought to be due to iron deficiency. -Repeat anemia panel. -FOBT. -Consider starting iron supplementation, IV might be best.  #Uncontrolled DM2 with peripheral neuropathy Last A1C 10.3 on 02/18/14.  Blood sugar elevated, but no anion gap. -Continue home Lantus 50 units. -CBG ACHS. -SSI resistant. -Continue home gabapentin.  #HLD LDL elevated at 108 on 07/21/13. -Continue home statin.  #Asthma No wheezing currently. -Albuterol nebs q6hr prn.  #DVT ppx -Lovenox.  Dispo: Disposition is deferred at this time, awaiting improvement  of current medical problems. Anticipated discharge in approximately 2-3 day(s).   The patient does have a current PCP Luan Moore, MD), therefore will be require OPC follow-up after discharge.   The patient does have transportation limitations that hinder transportation to clinic appointments.   Signed:  Arman Filter, MD, PhD PGY-1 Internal Medicine Teaching Service Pager: [phone] 04/22/2014, 1:02 AM

## 2014-04-22 ENCOUNTER — Encounter (HOSPITAL_COMMUNITY): Payer: Self-pay | Admitting: General Practice

## 2014-04-22 DIAGNOSIS — M797 Fibromyalgia: Secondary | ICD-10-CM | POA: Diagnosis present

## 2014-04-22 DIAGNOSIS — R110 Nausea: Secondary | ICD-10-CM

## 2014-04-22 DIAGNOSIS — I959 Hypotension, unspecified: Secondary | ICD-10-CM | POA: Diagnosis present

## 2014-04-22 DIAGNOSIS — B181 Chronic viral hepatitis B without delta-agent: Secondary | ICD-10-CM | POA: Diagnosis present

## 2014-04-22 DIAGNOSIS — I951 Orthostatic hypotension: Secondary | ICD-10-CM | POA: Diagnosis not present

## 2014-04-22 DIAGNOSIS — K219 Gastro-esophageal reflux disease without esophagitis: Secondary | ICD-10-CM | POA: Diagnosis present

## 2014-04-22 DIAGNOSIS — I10 Essential (primary) hypertension: Secondary | ICD-10-CM | POA: Diagnosis present

## 2014-04-22 DIAGNOSIS — D500 Iron deficiency anemia secondary to blood loss (chronic): Secondary | ICD-10-CM | POA: Diagnosis present

## 2014-04-22 DIAGNOSIS — E1165 Type 2 diabetes mellitus with hyperglycemia: Secondary | ICD-10-CM | POA: Diagnosis present

## 2014-04-22 DIAGNOSIS — J189 Pneumonia, unspecified organism: Secondary | ICD-10-CM | POA: Diagnosis present

## 2014-04-22 DIAGNOSIS — Z794 Long term (current) use of insulin: Secondary | ICD-10-CM | POA: Diagnosis not present

## 2014-04-22 DIAGNOSIS — F418 Other specified anxiety disorders: Secondary | ICD-10-CM | POA: Diagnosis present

## 2014-04-22 DIAGNOSIS — K3184 Gastroparesis: Secondary | ICD-10-CM | POA: Diagnosis present

## 2014-04-22 DIAGNOSIS — E860 Dehydration: Secondary | ICD-10-CM | POA: Diagnosis present

## 2014-04-22 DIAGNOSIS — G4733 Obstructive sleep apnea (adult) (pediatric): Secondary | ICD-10-CM | POA: Diagnosis present

## 2014-04-22 DIAGNOSIS — A419 Sepsis, unspecified organism: Secondary | ICD-10-CM | POA: Diagnosis present

## 2014-04-22 DIAGNOSIS — Z8673 Personal history of transient ischemic attack (TIA), and cerebral infarction without residual deficits: Secondary | ICD-10-CM | POA: Diagnosis not present

## 2014-04-22 DIAGNOSIS — G8929 Other chronic pain: Secondary | ICD-10-CM | POA: Diagnosis present

## 2014-04-22 DIAGNOSIS — Z8619 Personal history of other infectious and parasitic diseases: Secondary | ICD-10-CM | POA: Insufficient documentation

## 2014-04-22 DIAGNOSIS — E785 Hyperlipidemia, unspecified: Secondary | ICD-10-CM | POA: Diagnosis present

## 2014-04-22 DIAGNOSIS — Y95 Nosocomial condition: Secondary | ICD-10-CM | POA: Diagnosis present

## 2014-04-22 DIAGNOSIS — D473 Essential (hemorrhagic) thrombocythemia: Secondary | ICD-10-CM | POA: Diagnosis present

## 2014-04-22 DIAGNOSIS — J45909 Unspecified asthma, uncomplicated: Secondary | ICD-10-CM | POA: Diagnosis present

## 2014-04-22 DIAGNOSIS — E1140 Type 2 diabetes mellitus with diabetic neuropathy, unspecified: Secondary | ICD-10-CM | POA: Diagnosis present

## 2014-04-22 DIAGNOSIS — I252 Old myocardial infarction: Secondary | ICD-10-CM | POA: Diagnosis not present

## 2014-04-22 DIAGNOSIS — Z79899 Other long term (current) drug therapy: Secondary | ICD-10-CM | POA: Diagnosis not present

## 2014-04-22 LAB — STREP PNEUMONIAE URINARY ANTIGEN: STREP PNEUMO URINARY ANTIGEN: NEGATIVE

## 2014-04-22 LAB — FOLATE: Folate: 18.1 ng/mL

## 2014-04-22 LAB — BASIC METABOLIC PANEL
Anion gap: 7 (ref 5–15)
BUN: 8 mg/dL (ref 6–23)
CALCIUM: 8.5 mg/dL (ref 8.4–10.5)
CHLORIDE: 103 meq/L (ref 96–112)
CO2: 25 mmol/L (ref 19–32)
Creatinine, Ser: 0.87 mg/dL (ref 0.50–1.10)
GFR, EST NON AFRICAN AMERICAN: 82 mL/min — AB (ref >90)
GLUCOSE: 262 mg/dL — AB (ref 70–99)
POTASSIUM: 3.5 mmol/L (ref 3.5–5.1)
SODIUM: 135 mmol/L (ref 135–145)

## 2014-04-22 LAB — PROCALCITONIN

## 2014-04-22 LAB — FERRITIN: Ferritin: 15 ng/mL (ref 10–291)

## 2014-04-22 LAB — GLUCOSE, CAPILLARY
GLUCOSE-CAPILLARY: 76 mg/dL (ref 70–99)
GLUCOSE-CAPILLARY: 306 mg/dL — AB (ref 70–99)
Glucose-Capillary: 340 mg/dL — ABNORMAL HIGH (ref 70–99)
Glucose-Capillary: 186 mg/dL — ABNORMAL HIGH (ref 70–99)
Glucose-Capillary: 157 mg/dL — ABNORMAL HIGH (ref 70–99)

## 2014-04-22 LAB — RETICULOCYTES
RBC.: 4.87 MIL/uL (ref 3.87–5.11)
RETIC COUNT ABSOLUTE: 68.2 10*3/uL (ref 19.0–186.0)
RETIC CT PCT: 1.4 % (ref 0.4–3.1)

## 2014-04-22 LAB — CBC WITH DIFFERENTIAL/PLATELET
Basophils Absolute: 0 10*3/uL (ref 0.0–0.1)
Basophils Relative: 0 % (ref 0–1)
Eosinophils Absolute: 0.2 10*3/uL (ref 0.0–0.7)
Eosinophils Relative: 2 % (ref 0–5)
HEMATOCRIT: 26.8 % — AB (ref 36.0–46.0)
HEMOGLOBIN: 8.2 g/dL — AB (ref 12.0–15.0)
Lymphocytes Relative: 25 % (ref 12–46)
Lymphs Abs: 2.8 10*3/uL (ref 0.7–4.0)
MCH: 18.8 pg — ABNORMAL LOW (ref 26.0–34.0)
MCHC: 30.6 g/dL (ref 30.0–36.0)
MCV: 61.5 fL — ABNORMAL LOW (ref 78.0–100.0)
MONOS PCT: 8 % (ref 3–12)
Monocytes Absolute: 0.9 10*3/uL (ref 0.1–1.0)
Neutro Abs: 7.2 10*3/uL (ref 1.7–7.7)
Neutrophils Relative %: 65 % (ref 43–77)
Platelets: 471 10*3/uL — ABNORMAL HIGH (ref 150–400)
RBC: 4.36 MIL/uL (ref 3.87–5.11)
RDW: 18.6 % — ABNORMAL HIGH (ref 11.5–15.5)
WBC: 11.1 10*3/uL — ABNORMAL HIGH (ref 4.0–10.5)

## 2014-04-22 LAB — RAPID URINE DRUG SCREEN, HOSP PERFORMED
AMPHETAMINES: NOT DETECTED
Barbiturates: NOT DETECTED
Benzodiazepines: NOT DETECTED
Cocaine: POSITIVE — AB
Opiates: NOT DETECTED
Tetrahydrocannabinol: NOT DETECTED

## 2014-04-22 LAB — LACTIC ACID, PLASMA: Lactic Acid, Venous: 1.7 mmol/L (ref 0.5–2.2)

## 2014-04-22 LAB — IRON AND TIBC
Iron: <10 ug/dL — ABNORMAL LOW (ref 42–145)
UIBC: 315 ug/dL (ref 125–400)

## 2014-04-22 LAB — URINALYSIS, ROUTINE W REFLEX MICROSCOPIC
Bilirubin Urine: NEGATIVE
Glucose, UA: >1000 mg/dL — AB
Hgb urine dipstick: NEGATIVE
KETONES UR: NEGATIVE mg/dL
LEUKOCYTES UA: NEGATIVE
Nitrite: NEGATIVE
PH: 5.5 (ref 5.0–8.0)
Protein, ur: NEGATIVE mg/dL
Specific Gravity, Urine: 1.041 — ABNORMAL HIGH (ref 1.005–1.030)
Urobilinogen, UA: 0.2 mg/dL (ref 0.0–1.0)

## 2014-04-22 LAB — URINE MICROSCOPIC-ADD ON

## 2014-04-22 LAB — MAGNESIUM: Magnesium: 1.9 mg/dL (ref 1.5–2.5)

## 2014-04-22 LAB — TROPONIN I
Troponin I: <0.03 ng/mL (ref <0.031)
Troponin I: <0.03 ng/mL (ref <0.031)

## 2014-04-22 LAB — ETHANOL: Alcohol, Ethyl (B): <5 mg/dL (ref 0–9)

## 2014-04-22 LAB — VITAMIN B12: VITAMIN B 12: 490 pg/mL (ref 211–911)

## 2014-04-22 LAB — MRSA PCR SCREENING: MRSA BY PCR: NEGATIVE

## 2014-04-22 LAB — PREALBUMIN: PREALBUMIN: 18.3 mg/dL (ref 17.0–34.0)

## 2014-04-22 MED ORDER — FERROUS SULFATE 325 (65 FE) MG PO TABS
325.0000 mg | ORAL_TABLET | Freq: Three times a day (TID) | ORAL | Status: DC
  Administered 2014-04-23 – 2014-04-26 (×10): 325 mg via ORAL
  Filled 2014-04-22 (×11): qty 1

## 2014-04-22 MED ORDER — ENOXAPARIN SODIUM 40 MG/0.4ML ~~LOC~~ SOLN
40.0000 mg | Freq: Every day | SUBCUTANEOUS | Status: DC
  Administered 2014-04-22 – 2014-04-26 (×4): 40 mg via SUBCUTANEOUS
  Filled 2014-04-22 (×4): qty 0.4

## 2014-04-22 MED ORDER — METOCLOPRAMIDE HCL 5 MG/ML IJ SOLN
5.0000 mg | Freq: Four times a day (QID) | INTRAMUSCULAR | Status: DC | PRN
  Administered 2014-04-22: 5 mg via INTRAVENOUS
  Filled 2014-04-22: qty 2

## 2014-04-22 MED ORDER — PANTOPRAZOLE SODIUM 40 MG IV SOLR
40.0000 mg | INTRAVENOUS | Status: DC
  Administered 2014-04-22 – 2014-04-25 (×4): 40 mg via INTRAVENOUS
  Filled 2014-04-22 (×4): qty 40

## 2014-04-22 MED ORDER — SODIUM CHLORIDE 0.9 % IV SOLN
INTRAVENOUS | Status: DC
  Administered 2014-04-22: 02:00:00 via INTRAVENOUS

## 2014-04-22 MED ORDER — INSULIN GLARGINE 100 UNIT/ML SOLOSTAR PEN: 50.0000 [IU] | PEN_INJECTOR | Freq: Every day | SUBCUTANEOUS | Status: DC

## 2014-04-22 MED ORDER — SENNOSIDES-DOCUSATE SODIUM 8.6-50 MG PO TABS: 1.0000 | ORAL_TABLET | Freq: Every evening | ORAL | Status: DC | PRN

## 2014-04-22 MED ORDER — VANCOMYCIN HCL 10 G IV SOLR
1500.0000 mg | Freq: Once | INTRAVENOUS | Status: AC
  Administered 2014-04-22: 1500 mg via INTRAVENOUS
  Filled 2014-04-22: qty 1500

## 2014-04-22 MED ORDER — SODIUM CHLORIDE 0.9 % IV SOLN
INTRAVENOUS | Status: AC
  Administered 2014-04-22 (×2): via INTRAVENOUS

## 2014-04-22 MED ORDER — HYDROMORPHONE HCL 1 MG/ML IJ SOLN
0.5000 mg | Freq: Once | INTRAMUSCULAR | Status: AC
  Administered 2014-04-22: 0.5 mg via INTRAVENOUS
  Filled 2014-04-22: qty 1

## 2014-04-22 MED ORDER — INSULIN ASPART 100 UNIT/ML ~~LOC~~ SOLN
10.0000 [IU] | Freq: Once | SUBCUTANEOUS | Status: AC
  Administered 2014-04-22: 10 [IU] via SUBCUTANEOUS

## 2014-04-22 MED ORDER — GABAPENTIN 300 MG PO CAPS
300.0000 mg | ORAL_CAPSULE | Freq: Three times a day (TID) | ORAL | Status: DC
  Administered 2014-04-22 – 2014-04-26 (×13): 300 mg via ORAL
  Filled 2014-04-22 (×13): qty 1

## 2014-04-22 MED ORDER — ACETAMINOPHEN 325 MG PO TABS
650.0000 mg | ORAL_TABLET | Freq: Four times a day (QID) | ORAL | Status: DC | PRN
  Administered 2014-04-22 – 2014-04-24 (×4): 650 mg via ORAL
  Filled 2014-04-22 (×5): qty 2

## 2014-04-22 MED ORDER — ATORVASTATIN CALCIUM 40 MG PO TABS
40.0000 mg | ORAL_TABLET | Freq: Every day | ORAL | Status: DC
  Administered 2014-04-23 – 2014-04-26 (×4): 40 mg via ORAL
  Filled 2014-04-22 (×4): qty 1

## 2014-04-22 MED ORDER — KETOROLAC TROMETHAMINE 30 MG/ML IJ SOLN
30.0000 mg | Freq: Four times a day (QID) | INTRAMUSCULAR | Status: DC | PRN
  Administered 2014-04-22: 30 mg via INTRAVENOUS
  Filled 2014-04-22: qty 1

## 2014-04-22 MED ORDER — INSULIN GLARGINE 100 UNIT/ML ~~LOC~~ SOLN
50.0000 [IU] | Freq: Every day | SUBCUTANEOUS | Status: DC
  Administered 2014-04-22 – 2014-04-24 (×4): 50 [IU] via SUBCUTANEOUS
  Filled 2014-04-22 (×4): qty 0.5

## 2014-04-22 MED ORDER — DEXTROSE 5 % IV SOLN
1.0000 g | Freq: Three times a day (TID) | INTRAVENOUS | Status: DC
  Administered 2014-04-22 – 2014-04-25 (×9): 1 g via INTRAVENOUS
  Filled 2014-04-22 (×12): qty 1

## 2014-04-22 MED ORDER — ACETAMINOPHEN 650 MG RE SUPP: 650.0000 mg | Freq: Four times a day (QID) | RECTAL | Status: DC | PRN

## 2014-04-22 MED ORDER — VANCOMYCIN HCL IN DEXTROSE 1-5 GM/200ML-% IV SOLN
1000.0000 mg | Freq: Three times a day (TID) | INTRAVENOUS | Status: DC
  Administered 2014-04-22 – 2014-04-23 (×3): 1000 mg via INTRAVENOUS
  Filled 2014-04-22 (×5): qty 200

## 2014-04-22 MED ORDER — ENOXAPARIN SODIUM 40 MG/0.4ML ~~LOC~~ SOLN: 40.0000 mg | Freq: Every day | SUBCUTANEOUS | Status: DC

## 2014-04-22 MED ORDER — SODIUM CHLORIDE 0.9 % IV SOLN
INTRAVENOUS | Status: DC
  Administered 2014-04-23: 09:00:00 via INTRAVENOUS

## 2014-04-22 MED ORDER — ALBUTEROL SULFATE (2.5 MG/3ML) 0.083% IN NEBU
2.5000 mg | INHALATION_SOLUTION | Freq: Four times a day (QID) | RESPIRATORY_TRACT | Status: DC | PRN
  Filled 2014-04-22: qty 3

## 2014-04-22 MED ORDER — INSULIN ASPART 100 UNIT/ML ~~LOC~~ SOLN
0.0000 [IU] | Freq: Three times a day (TID) | SUBCUTANEOUS | Status: DC
  Administered 2014-04-22: 15 [IU] via SUBCUTANEOUS
  Administered 2014-04-22: 4 [IU] via SUBCUTANEOUS
  Administered 2014-04-23 – 2014-04-24 (×2): 15 [IU] via SUBCUTANEOUS
  Administered 2014-04-24: 4 [IU] via SUBCUTANEOUS
  Administered 2014-04-24: 20 [IU] via SUBCUTANEOUS
  Administered 2014-04-25 (×2): 4 [IU] via SUBCUTANEOUS
  Administered 2014-04-25: 7 [IU] via SUBCUTANEOUS
  Administered 2014-04-26 (×2): 4 [IU] via SUBCUTANEOUS

## 2014-04-22 MED ORDER — HYDROMORPHONE HCL 1 MG/ML IJ SOLN
1.0000 mg | Freq: Once | INTRAMUSCULAR | Status: AC
  Administered 2014-04-22: 1 mg via INTRAVENOUS
  Filled 2014-04-22: qty 1

## 2014-04-22 MED ORDER — ONDANSETRON HCL 4 MG/2ML IJ SOLN: 4.0000 mg | Freq: Once | INTRAMUSCULAR | Status: DC

## 2014-04-22 NOTE — Care Management Note (Unsigned)
    Page 1 of 1   04/22/2014     10:36:56 AM CARE MANAGEMENT NOTE 04/22/2014  Patient:  MEDA, DUDZINSKI   Account Number:  [id number]  Date Initiated:  04/22/2014  Documentation initiated by:  GRAVES-BIGELOW,Lane Kjos  Subjective/Objective Assessment:   Pt admitted for cp. Treating HCAP with empiric antibiotics.     Action/Plan:   Per pt she is followed up by [facility] in the community. CM did place call to [facility] to make them aware that pt is hospitalized.   Anticipated DC Date:  04/24/2014   Anticipated DC Plan:  [place]  CM consult      Choice offered to / List presented to:             Status of service:  In process, will continue to follow Medicare Important Message given?  NO (If response is "NO", the following Medicare IM given date fields will be blank) Date Medicare IM given:   Medicare IM given by:   Date Additional Medicare IM given:   Additional Medicare IM given by:    Discharge Disposition:    Per UR Regulation:  Reviewed for med. necessity/level of care/duration of stay  If discussed at [place] of Stay Meetings, dates discussed:    Comments:

## 2014-04-22 NOTE — Progress Notes (Signed)
ANTIBIOTIC CONSULT NOTE - INITIAL  Pharmacy Consult for Vancomycin and Cefepime  Indication: rule out pneumonia  Allergies  Allergen Reactions  . Lisinopril Nausea Only  . Morphine And Related Itching    Patient Measurements: Weight: 207 lb 3.7 oz (94 kg) (04/04/14) Adjusted Body Weight: 80 kg  Vital Signs: Temp: 98.6 F (37 C) (12/29 2147) Temp Source: Oral (12/29 2147) BP: 122/86 mmHg (12/30 0000) Pulse Rate: 104 (12/30 0000) Intake/Output from previous day:   Intake/Output from this shift:    Labs:  Recent Labs  04/21/14 1820  WBC 14.9*  HGB 9.6*  PLT 512*  CREATININE 1.05   Estimated Creatinine Clearance: 83.9 mL/min (by C-G formula based on Cr of 1.05). No results for input(s): VANCOTROUGH, VANCOPEAK, VANCORANDOM, GENTTROUGH, GENTPEAK, GENTRANDOM, TOBRATROUGH, TOBRAPEAK, TOBRARND, AMIKACINPEAK, AMIKACINTROU, AMIKACIN in the last 72 hours.   Microbiology: No results found for this or any previous visit (from the past 720 hour(s)).  Medical History: Past Medical History  Diagnosis Date  . Thrombocytosis     Hem/Onc suggested 2/2 chronic hepatits and/or iron deficiency anemia  . Iron deficiency anemia   . N&V (nausea and vomiting)     Chronic. Unclear etiology with multiple admission and ED visits. CT abdomen with and without contrast (02/2011)  showed no acute process. Gastic Emptying scan (01/2010) was normal. Ultrasound of the abdomen was within normal limits. Hepatitis B viral load was undectable. HIV NR. EGD - gastritis, Hpylori + s/p Rx  . Hypertension   . Hyperlipidemia   . Diabetes mellitus type 2, uncontrolled, with complications   . Recurrent boils   . Back pain   . OSA (obstructive sleep apnea)   . 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 Cath 12/10- no restenosis or significant CAD progression  . Irregular menses     Small ovarian  follicles seen on TZ(0/01)  . History of pyelonephritis     H/o GrpB Pyelonephritis (9/06) and UTI- 07/11- E.Coli, 12/10- GBS  . Abscess of tunica vaginalis     10/09- Abundant S. aureus- sensitive to all abx  . Obesity   . Asthma   . Gastritis   . Depression   . Hepatitis B, chronic     Hep BeAb+,Hep B cAb+ & Hep BsAg+ (9/06)  . Peripheral neuropathy   . Fibromyalgia   . GERD (gastroesophageal reflux disease)   . Migraine   . Anxiety   . Gastroparesis     secondary to poorly controlled DM, last emptying study performed 01/2010  was normal but may be falsely positive as pt was on reglan  . Blood dyscrasia   . H/O: CVA (cerebrovascular accident)     history of remote right cerebellar infarct noted on head CT at least since 10/2011  . MI (myocardial infarction)   . Substance abuse   . Cancer     pt states no hx ca 12/31/13    Medications:  ALbuterol  Neurontin  Novolog  Lantuas  Labetalol  Prilosec  Ativan  Naproxen  Senokot-S  Assessment: 38 yo female with HCAP for empiric antibiotics  Goal of Therapy:  Vancomycin trough level 15-20 mcg/ml  Plan:  Vancomycin 1500 mg IV now, then 1 g IV q8h Cefepime 1 g IV q8h  Helio Lack, Bronson Curb 04/22/2014,12:45 AM

## 2014-04-22 NOTE — Consult Note (Signed)
[facility] CONSULT Reason for consult: nausea and vomiting Referring Physician: Internal Medicine Teaching Service  Shirley [name] is an 38 y.o. female.  HPI: 38 year old woman who states that she has had nausea and vomiting now for approximately 3 to 4 years. This is been progressive and started around 2011. She was diagnosed with H pylori by Dr. Fuller Plan and was treated she said on several different occasions and her H pylori was verified to be adequately treated. She stated that she has been persistently nauseated ever since she developed H pylori. She's had multiple hospital visits, ER visits etc. She's had CT scans of the abdomen ultrasounds multiple x-rays etc. In 2011 she had a gastric emptying scan that was normal. She reports that she continues to vomit at least every week sometimes several days a week. This is causes her to lose weight and she has lost from 420 pounds down to 195 pounds according to her history. She does have diabetes and states that the vomiting is causing her sugar to be difficult to control. Most recent evaluation is shown sludge in her gallbladder. She said several family members and had to have a gallbladder removed. She denies pain in the right upper quadrant but states that she has chronic left lower quadrant abdominal pain. This appears to be unrelated to bowel movements or 2 passage of flatus. She's never had colonoscopy. Her multiple other problems include type I diabetes, CAD with history of previous angioplasty, iron deficiency anemia, iron deficiency, obstructive sleep apnea, fibromyalgia, prior history of hepatitis B, migraine headaches, depression, questionable history of mild stroke. She has neuropathy due to her diabetes which causes lots of pain.  Past Medical History  Diagnosis Date  . Thrombocytosis     Hem/Onc suggested 2/2 chronic hepatits and/or iron deficiency anemia  . Iron deficiency anemia   . N&V (nausea and vomiting)     Chronic. Unclear  etiology with multiple admission and ED visits. CT abdomen with and without contrast (02/2011)  showed no acute process. Gastic Emptying scan (01/2010) was normal. Ultrasound of the abdomen was within normal limits. Hepatitis B viral load was undectable. HIV NR. EGD - gastritis, Hpylori + s/p Rx  . Hypertension   . Hyperlipidemia   . Diabetes mellitus type 2, uncontrolled, with complications   . Recurrent boils   . Back pain   . OSA (obstructive sleep apnea)   . 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 Cath 12/10- no restenosis or significant CAD progression  . Irregular menses     Small ovarian follicles seen on UM(3/53)  . History of pyelonephritis     H/o GrpB Pyelonephritis (9/06) and UTI- 07/11- E.Coli, 12/10- GBS  . Abscess of tunica vaginalis     10/09- Abundant S. aureus- sensitive to all abx  . Obesity   . Asthma   . Gastritis   . Depression   . Hepatitis B, chronic     Hep BeAb+,Hep B cAb+ & Hep BsAg+ (9/06)  . Peripheral neuropathy   . Fibromyalgia   . GERD (gastroesophageal reflux disease)   . Migraine   . Anxiety   . Gastroparesis     secondary to poorly controlled DM, last emptying study performed 01/2010  was normal but may be falsely positive as pt was on reglan  . Blood dyscrasia   . H/O: CVA (cerebrovascular accident)     history  of remote right cerebellar infarct noted on head CT at least since 10/2011  . MI (myocardial infarction)   . Substance abuse   . Cancer     pt states no hx ca 12/31/13    Past Surgical History  Procedure Laterality Date  . Cesarean section  1997    Family History  Problem Relation Age of Onset  . Diabetes Father     Social History:  reports that she quit smoking about 18 years ago. Her smoking use included Cigarettes. She smoked 0.00 packs per day. She has never used smokeless tobacco. She reports that she drinks  alcohol. She reports that she uses illicit drugs (Marijuana and Cocaine).  Allergies:  Allergies  Allergen Reactions  . Lisinopril Nausea Only  . Morphine And Related Itching    Medications; Prior to Admission medications   Medication Sig Start Date End Date Taking? Authorizing Provider  albuterol (PROVENTIL HFA;VENTOLIN HFA) 108 (90 BASE) MCG/ACT inhaler Inhale 2 puffs into the lungs every 4 (four) hours as needed for wheezing or shortness of breath.   Yes Historical Provider, MD  gabapentin (NEURONTIN) 300 MG capsule Take 300 mg by mouth 3 (three) times daily.    Yes Historical Provider, MD  insulin aspart (NOVOLOG) 100 UNIT/ML injection Sliding scale- If sugar 150-199: inject 2 unit;  200-249: 4 units, 250-299: 7 units; 300-349: 10 units; Over 350: 12 units Patient taking differently: Inject 2-12 Units into the skin 3 (three) times daily with meals. Sliding scale- If sugar 150-199: inject 2 unit;  200-249: 4 units, 250-299: 7 units; 300-349: 10 units; Over 350: 12 units 11/11/13  Yes Bartholomew Crews, MD  Insulin Glargine (LANTUS SOLOSTAR) 100 UNIT/ML Solostar Pen Inject 50 Units into the skin daily at 10 pm. 11/11/13  Yes Bartholomew Crews, MD  labetalol (NORMODYNE) 100 MG tablet Take 1 tablet (100 mg total) by mouth 2 (two) times daily. 03/05/14  Yes Karren Cobble, MD  LORazepam (ATIVAN) 1 MG tablet Take 1 mg by mouth every 8 (eight) hours as needed for anxiety.   Yes Historical Provider, MD  naproxen sodium (ANAPROX) 220 MG tablet Take 440 mg by mouth 2 (two) times daily as needed (pain).   Yes Historical Provider, MD  omeprazole (PRILOSEC) 20 MG capsule Take 1 capsule (20 mg total) by mouth daily. 12/31/13  Yes Hoy Morn, MD  promethazine (PHENERGAN) 25 MG suppository Place 1 suppository (25 mg total) rectally every 6 (six) hours as needed for nausea or vomiting. 04/04/14  Yes Jacques Earthly, MD  senna-docusate (SENOKOT-S) 8.6-50 MG per tablet Take 1 tablet by mouth daily.   Yes  Historical Provider, MD  atorvastatin (LIPITOR) 80 MG tablet Take 1 tablet (80 mg total) by mouth daily at 6 PM. Patient not taking: Reported on 03/11/2014 07/25/13   Dixon Boos, MD  dicyclomine (BENTYL) 20 MG tablet Take 1 tablet (20 mg total) by mouth 2 (two) times daily. Patient not taking: Reported on 04/03/2014 12/31/13   Hoy Morn, MD   . Derrill Memo ON 04/23/2014] atorvastatin  40 mg Oral q1800  . ceFEPime (MAXIPIME) IV  1 g Intravenous Q8H  . enoxaparin (LOVENOX) injection  40 mg Subcutaneous Daily  . gabapentin  300 mg Oral TID  . insulin aspart  0-20 Units Subcutaneous TID WC  . insulin glargine  50 Units Subcutaneous QHS  . pantoprazole (PROTONIX) IV  40 mg Intravenous Q24H  . vancomycin  1,000 mg Intravenous Q8H   PRN Meds acetaminophen **  OR** acetaminophen, albuterol, metoCLOPramide (REGLAN) injection, senna-docusate Results for orders placed or performed during the hospital encounter of 04/21/14 (from the past 48 hour(s))  CBC with Differential     Status: Abnormal   Collection Time: 04/21/14  6:20 PM  Result Value Ref Range   WBC 14.9 (H) 4.0 - 10.5 K/uL   RBC 4.96 3.87 - 5.11 MIL/uL   Hemoglobin 9.6 (L) 12.0 - 15.0 g/dL   HCT 30.2 (L) 36.0 - 46.0 %   MCV 60.9 (L) 78.0 - 100.0 fL   MCH 19.4 (L) 26.0 - 34.0 pg   MCHC 31.8 30.0 - 36.0 g/dL   RDW 18.7 (H) 11.5 - 15.5 %   Platelets 512 (H) 150 - 400 K/uL   Neutrophils Relative % 70 43 - 77 %   Lymphocytes Relative 20 12 - 46 %   Monocytes Relative 8 3 - 12 %   Eosinophils Relative 2 0 - 5 %   Basophils Relative 0 0 - 1 %   Neutro Abs 10.4 (H) 1.7 - 7.7 K/uL   Lymphs Abs 3.0 0.7 - 4.0 K/uL   Monocytes Absolute 1.2 (H) 0.1 - 1.0 K/uL   Eosinophils Absolute 0.3 0.0 - 0.7 K/uL   Basophils Absolute 0.0 0.0 - 0.1 K/uL   RBC Morphology POLYCHROMASIA PRESENT     Comment: TARGET CELLS   WBC Morphology TOXIC GRANULATION   Comprehensive metabolic panel     Status: Abnormal   Collection Time: 04/21/14  6:20 PM  Result  Value Ref Range   Sodium 133 (L) 135 - 145 mmol/L    Comment: Please note change in reference range.   Potassium 3.8 3.5 - 5.1 mmol/L    Comment: Please note change in reference range.   Chloride 99 96 - 112 mEq/L   CO2 24 19 - 32 mmol/L   Glucose, Bld 415 (H) 70 - 99 mg/dL   BUN 11 6 - 23 mg/dL   Creatinine, Ser 1.05 0.50 - 1.10 mg/dL   Calcium 9.1 8.4 - 10.5 mg/dL   Total Protein 7.7 6.0 - 8.3 g/dL   Albumin 2.9 (L) 3.5 - 5.2 g/dL   AST 16 0 - 37 U/L   ALT 7 0 - 35 U/L   Alkaline Phosphatase 60 39 - 117 U/L   Total Bilirubin 0.4 0.3 - 1.2 mg/dL   GFR calc non Af Amer 66 (L) >90 mL/min   GFR calc Af Amer 76 (L) >90 mL/min    Comment: (NOTE) The eGFR has been calculated using the CKD EPI equation. This calculation has not been validated in all clinical situations. eGFR's persistently <90 mL/min signify possible Chronic Kidney Disease.    Anion gap 10 5 - 15  Lipase, blood     Status: None   Collection Time: 04/21/14  6:20 PM  Result Value Ref Range   Lipase 36 11 - 59 U/L  I-stat troponin, ED (not at [facility])     Status: None   Collection Time: 04/21/14  6:30 PM  Result Value Ref Range   Troponin i, poc 0.00 0.00 - 0.08 ng/mL   Comment 3            Comment: Due to the release kinetics of cTnI, a negative result within the first hours of the onset of symptoms does not rule out myocardial infarction with certainty. If myocardial infarction is still suspected, repeat the test at appropriate intervals.   I-Stat CG4 Lactic Acid, ED     Status:  Abnormal   Collection Time: 04/21/14 10:34 PM  Result Value Ref Range   Lactic Acid, Venous 2.80 (H) 0.5 - 2.2 mmol/L  I-Stat Beta hCG blood, ED ([facility], [facility], [facility] only)     Status: None   Collection Time: 04/21/14 10:47 PM  Result Value Ref Range   I-stat hCG, quantitative <5.0 <5 mIU/mL   Comment 3            Comment:   GEST. AGE      CONC.  (mIU/mL)   <=1 WEEK        5 - 50     2 WEEKS       50 - 500     3 WEEKS       100 - 10,000      4 WEEKS     1,000 - 30,000        FEMALE AND NON-PREGNANT FEMALE:     LESS THAN 5 mIU/mL   Urine rapid drug screen (hosp performed)     Status: Abnormal   Collection Time: 04/22/14 12:37 AM  Result Value Ref Range   Opiates NONE DETECTED NONE DETECTED   Cocaine POSITIVE (A) NONE DETECTED   Benzodiazepines NONE DETECTED NONE DETECTED   Amphetamines NONE DETECTED NONE DETECTED   Tetrahydrocannabinol NONE DETECTED NONE DETECTED   Barbiturates NONE DETECTED NONE DETECTED    Comment:        DRUG SCREEN FOR MEDICAL PURPOSES ONLY.  IF CONFIRMATION IS NEEDED FOR ANY PURPOSE, NOTIFY LAB WITHIN 5 DAYS.        LOWEST DETECTABLE LIMITS FOR URINE DRUG SCREEN Drug Class       Cutoff (ng/mL) Amphetamine      1000 Barbiturate      200 Benzodiazepine   229 Tricyclics       798 Opiates          300 Cocaine          300 THC              50   Urinalysis, Routine w reflex microscopic     Status: Abnormal   Collection Time: 04/22/14 12:37 AM  Result Value Ref Range   Color, Urine YELLOW YELLOW   APPearance CLOUDY (A) CLEAR   Specific Gravity, Urine 1.041 (H) 1.005 - 1.030   pH 5.5 5.0 - 8.0   Glucose, UA >1000 (A) NEGATIVE mg/dL   Hgb urine dipstick NEGATIVE NEGATIVE   Bilirubin Urine NEGATIVE NEGATIVE   Ketones, ur NEGATIVE NEGATIVE mg/dL   Protein, ur NEGATIVE NEGATIVE mg/dL   Urobilinogen, UA 0.2 0.0 - 1.0 mg/dL   Nitrite NEGATIVE NEGATIVE   Leukocytes, UA NEGATIVE NEGATIVE  Strep pneumoniae urinary antigen     Status: None   Collection Time: 04/22/14 12:37 AM  Result Value Ref Range   Strep Pneumo Urinary Antigen NEGATIVE NEGATIVE    Comment:        Infection due to S. pneumoniae cannot be absolutely ruled out since the antigen present may be below the detection limit of the test.   Urine microscopic-add on     Status: Abnormal   Collection Time: 04/22/14 12:37 AM  Result Value Ref Range   Squamous Epithelial / LPF MANY (A) RARE   WBC, UA 0-2 <3 WBC/hpf   Bacteria, UA MANY  (A) RARE  Troponin I     Status: None   Collection Time: 04/22/14  1:18 AM  Result Value Ref Range   Troponin I <0.03 <0.031 ng/mL  Comment:        NO INDICATION OF MYOCARDIAL INJURY. Please note change in reference range.   Procalcitonin     Status: None   Collection Time: 04/22/14  1:18 AM  Result Value Ref Range   Procalcitonin <0.10 ng/mL    Comment:        Interpretation: PCT (Procalcitonin) <= 0.5 ng/mL: Systemic infection (sepsis) is not likely. Local bacterial infection is possible. (NOTE)         ICU PCT Algorithm               Non ICU PCT Algorithm    ----------------------------     ------------------------------         PCT < 0.25 ng/mL                 PCT < 0.1 ng/mL     Stopping of antibiotics            Stopping of antibiotics       strongly encouraged.               strongly encouraged.    ----------------------------     ------------------------------       PCT level decrease by               PCT < 0.25 ng/mL       >= 80% from peak PCT       OR PCT 0.25 - 0.5 ng/mL          Stopping of antibiotics                                             encouraged.     Stopping of antibiotics           encouraged.    ----------------------------     ------------------------------       PCT level decrease by              PCT >= 0.25 ng/mL       < 80% from peak PCT        AND PCT >= 0.5 ng/mL            Continuin g antibiotics                                              encouraged.       Continuing antibiotics            encouraged.    ----------------------------     ------------------------------     PCT level increase compared          PCT > 0.5 ng/mL         with peak PCT AND          PCT >= 0.5 ng/mL             Escalation of antibiotics                                          strongly encouraged.      Escalation of antibiotics        strongly encouraged.   Ethanol  Status: None   Collection Time: 04/22/14  1:18 AM  Result Value Ref Range   Alcohol,  Ethyl (B) <5 0 - 9 mg/dL    Comment:        LOWEST DETECTABLE LIMIT FOR SERUM ALCOHOL IS 11 mg/dL FOR MEDICAL PURPOSES ONLY   Magnesium     Status: None   Collection Time: 04/22/14  1:18 AM  Result Value Ref Range   Magnesium 1.9 1.5 - 2.5 mg/dL  Vitamin B12     Status: None   Collection Time: 04/22/14  1:18 AM  Result Value Ref Range   Vitamin B-12 490 211 - 911 pg/mL    Comment: Performed at [facility]  Folate     Status: None   Collection Time: 04/22/14  1:18 AM  Result Value Ref Range   Folate 18.1 ng/mL    Comment: (NOTE) Reference Ranges        Deficient:       0.4 - 3.3 ng/mL        Indeterminate:   3.4 - 5.4 ng/mL        Normal:              > 5.4 ng/mL Performed at [facility]   Iron and TIBC     Status: Abnormal   Collection Time: 04/22/14  1:18 AM  Result Value Ref Range   Iron <10 (L) 42 - 145 ug/dL   TIBC Not calculated due to Iron <10. 250 - 470 ug/dL   Saturation Ratios Not calculated due to Iron <10. 20 - 55 %   UIBC 315 125 - 400 ug/dL    Comment: Performed at [facility]  Ferritin     Status: None   Collection Time: 04/22/14  1:18 AM  Result Value Ref Range   Ferritin 15 10 - 291 ng/mL    Comment: Performed at [facility]  Reticulocytes     Status: None   Collection Time: 04/22/14  1:18 AM  Result Value Ref Range   Retic Ct Pct 1.4 0.4 - 3.1 %   RBC. 4.87 3.87 - 5.11 MIL/uL   Retic Count, Manual 68.2 19.0 - 186.0 K/uL  Prealbumin     Status: None   Collection Time: 04/22/14  1:18 AM  Result Value Ref Range   Prealbumin 18.3 17.0 - 34.0 mg/dL    Comment: Performed at [facility]  Lactic acid, plasma     Status: None   Collection Time: 04/22/14  1:19 AM  Result Value Ref Range   Lactic Acid, Venous 1.7 0.5 - 2.2 mmol/L  Glucose, capillary     Status: Abnormal   Collection Time: 04/22/14  1:49 AM  Result Value Ref Range   Glucose-Capillary 340 (H) 70 - 99 mg/dL  Troponin I     Status: None    Collection Time: 04/22/14  3:33 AM  Result Value Ref Range   Troponin I <0.03 <0.031 ng/mL    Comment:        NO INDICATION OF MYOCARDIAL INJURY. Please note change in reference range.   Basic metabolic panel     Status: Abnormal   Collection Time: 04/22/14  3:33 AM  Result Value Ref Range   Sodium 135 135 - 145 mmol/L    Comment: Please note change in reference range.   Potassium 3.5 3.5 - 5.1 mmol/L    Comment: Please note change in reference range.   Chloride 103 96 - 112 mEq/L  CO2 25 19 - 32 mmol/L   Glucose, Bld 262 (H) 70 - 99 mg/dL   BUN 8 6 - 23 mg/dL   Creatinine, Ser 0.87 0.50 - 1.10 mg/dL   Calcium 8.5 8.4 - 10.5 mg/dL   GFR calc non Af Amer 82 (L) >90 mL/min   GFR calc Af Amer >90 >90 mL/min    Comment: (NOTE) The eGFR has been calculated using the CKD EPI equation. This calculation has not been validated in all clinical situations. eGFR's persistently <90 mL/min signify possible Chronic Kidney Disease.    Anion gap 7 5 - 15  CBC WITH DIFFERENTIAL     Status: Abnormal   Collection Time: 04/22/14  3:33 AM  Result Value Ref Range   WBC 11.1 (H) 4.0 - 10.5 K/uL   RBC 4.36 3.87 - 5.11 MIL/uL   Hemoglobin 8.2 (L) 12.0 - 15.0 g/dL   HCT 26.8 (L) 36.0 - 46.0 %   MCV 61.5 (L) 78.0 - 100.0 fL   MCH 18.8 (L) 26.0 - 34.0 pg   MCHC 30.6 30.0 - 36.0 g/dL   RDW 18.6 (H) 11.5 - 15.5 %   Platelets 471 (H) 150 - 400 K/uL   Neutrophils Relative % 65 43 - 77 %   Lymphocytes Relative 25 12 - 46 %   Monocytes Relative 8 3 - 12 %   Eosinophils Relative 2 0 - 5 %   Basophils Relative 0 0 - 1 %   Neutro Abs 7.2 1.7 - 7.7 K/uL   Lymphs Abs 2.8 0.7 - 4.0 K/uL   Monocytes Absolute 0.9 0.1 - 1.0 K/uL   Eosinophils Absolute 0.2 0.0 - 0.7 K/uL   Basophils Absolute 0.0 0.0 - 0.1 K/uL   RBC Morphology TARGET CELLS   MRSA PCR Screening     Status: None   Collection Time: 04/22/14  3:50 AM  Result Value Ref Range   MRSA by PCR NEGATIVE NEGATIVE    Comment:        The GeneXpert  MRSA Assay (FDA approved for NASAL specimens only), is one component of a comprehensive MRSA colonization surveillance program. It is not intended to diagnose MRSA infection nor to guide or monitor treatment for MRSA infections.   Glucose, capillary     Status: None   Collection Time: 04/22/14  7:22 AM  Result Value Ref Range   Glucose-Capillary 76 70 - 99 mg/dL  Glucose, capillary     Status: Abnormal   Collection Time: 04/22/14 11:13 AM  Result Value Ref Range   Glucose-Capillary 186 (H) 70 - 99 mg/dL    Dg Chest 2 View  04/21/2014   CLINICAL DATA:  Chest pain for 3 days  EXAM: CHEST  2 VIEW  COMPARISON:  04/03/2014  FINDINGS: Heart size and mediastinal contours are normal. Developing opacity within the left upper lobe in the projection of the anterior first rib is identified and appears increased from 04/03/2014. Right lung is clear. No pleural effusion identified.  IMPRESSION: 1. Developing a left upper lobe opacity is likely infectious in etiology. Radiographic followup is recommended to ensure resolution.   Electronically Signed   By: Kerby Moors M.D.   On: 04/21/2014 19:10               Blood pressure 132/77, pulse 94, temperature 97.6 F (36.4 C), temperature source Oral, resp. rate 16, height _0  (1.702 m), weight 88.86 kg (195 lb 14.4 oz), last menstrual period 03/24/2014, SpO2 100 %.  Physical  exam:   General--Pleasant African-American female. No acute distress. Sitting in bed watching television. ENT-- no oral thrush Neck-- no lymphadenopathy Heart-- regular rate and rhythm without murmurs are gallops Lungs--clear Abdomen-- none distended in soft with good bowel sounds. Generally nontender. Indicates left lower quadrant is area tenderness. No right upper quadrant tenderness. Psych-- alert and oriented and appears appropriate   Assessment: 1. Chronic nausea vomiting and weight loss. The patient states all of this is due to H pylori. She reports no  other medications of helped her including metoclopramide. She does have some sludge in a gallbladder but it's not really clear if this is causing the problem or not. 2. Multiple chronic problems as noted above  Plan: 1. We will start off with EGD to evaluate for H. pylori. Have discussed this with patient. If this is nonrevealing we might want to consider hepatobiliary scan.   Erico Stan JR,Tyler Cubit L 04/22/2014, 3:20 PM

## 2014-04-22 NOTE — Progress Notes (Signed)
Subjective:  Patient was seen and examined this afternoon. Patient was seen after eating lunch. She states her nausea, vomiting is much improved and she has not recurrent episodes. She tolerated a full lunch of a sandwich well. She continues to have LLQ pain 9/10 fluctuating between 6-10/10 constantly. Nothing seems to make it better or worse. She has had this for many years with many negative CT scans. She has never followed up with GI and has had endoscopies, but no colonoscopies. She cannot seem to correlate pain with any type of foods. Patient is upset that she is on a heart healthy diet.  Objective: Filed Vitals:   04/22/14 0043 04/22/14 0146 04/22/14 0457 04/22/14 1421  BP:  136/86 103/59 132/77  Pulse:  105 89 94  Temp:  98.5 F (36.9 C) 98.4 F (36.9 C) 97.6 F (36.4 C)  TempSrc:  Oral Oral Oral  Resp:  20 18 16   Height:  5\' 7"  (1.702 m)    Weight: 94 kg (207 lb 3.7 oz) 88.86 kg (195 lb 14.4 oz)    SpO2:  98% 100% 100%   General: Vital signs reviewed.  Patient is well-developed and well-nourished, in no acute distress and cooperative with exam.  Cardiovascular: RRR, S1 normal, S2 normal, no murmurs, gallops, or rubs. Pulmonary/Chest: Clear to auscultation bilaterally, no wheezes, rales, or rhonchi. Abdominal: Soft, tender to palpation in LLQ, non-distended, BS +, no masses, organomegaly, or guarding present.  Extremities: No lower extremity edema bilaterally Neurological: A&O x3 Skin: Warm, dry and intact. No rashes or erythema. Psychiatric: Normal mood and affect. speech and behavior is normal. Cognition and memory are normal.   Lab Results: Basic Metabolic Panel:  Recent Labs Lab 04/21/14 1820 04/22/14 0118 04/22/14 0333  NA 133*  --  135  K 3.8  --  3.5  CL 99  --  103  CO2 24  --  25  GLUCOSE 415*  --  262*  BUN 11  --  8  CREATININE 1.05  --  0.87  CALCIUM 9.1  --  8.5  MG  --  1.9  --    Liver Function Tests:  Recent Labs Lab 04/21/14 1820  AST 16    ALT 7  ALKPHOS 60  BILITOT 0.4  PROT 7.7  ALBUMIN 2.9*    Recent Labs Lab 04/21/14 1820  LIPASE 36   CBC:  Recent Labs Lab 04/21/14 1820 04/22/14 0333  WBC 14.9* 11.1*  NEUTROABS 10.4* 7.2  HGB 9.6* 8.2*  HCT 30.2* 26.8*  MCV 60.9* 61.5*  PLT 512* 471*   Cardiac Enzymes:  Recent Labs Lab 04/22/14 0118 04/22/14 0333  TROPONINI <0.03 <0.03   CBG:  Recent Labs Lab 04/22/14 0149 04/22/14 0722 04/22/14 1113 04/22/14 1640  GLUCAP 340* 76 186* 306*   Anemia Panel:  Recent Labs Lab 04/22/14 0118  VITAMINB12 490  FOLATE 18.1  FERRITIN 15  TIBC Not calculated due to Iron <10.  IRON <10*  RETICCTPCT 1.4   Urine Drug Screen: Drugs of Abuse     Component Value Date/Time   LABOPIA NONE DETECTED 04/22/2014 0037   LABOPIA NEGATIVE 02/21/2010 2201   COCAINSCRNUR POSITIVE* 04/22/2014 0037   COCAINSCRNUR NEGATIVE 02/21/2010 2201   LABBENZ NONE DETECTED 04/22/2014 0037   LABBENZ NEGATIVE 02/21/2010 2201   AMPHETMU NONE DETECTED 04/22/2014 0037   AMPHETMU NEGATIVE 02/21/2010 2201   THCU NONE DETECTED 04/22/2014 0037   LABBARB NONE DETECTED 04/22/2014 0037    Alcohol Level:  Recent Labs Lab  04/22/14 0118  ETH <5   Urinalysis:  Recent Labs Lab 04/22/14 0037  COLORURINE YELLOW  LABSPEC 1.041*  PHURINE 5.5  GLUCOSEU >1000*  HGBUR NEGATIVE  BILIRUBINUR NEGATIVE  KETONESUR NEGATIVE  PROTEINUR NEGATIVE  UROBILINOGEN 0.2  NITRITE NEGATIVE  LEUKOCYTESUR NEGATIVE    Micro Results: Recent Results (from the past 240 hour(s))  MRSA PCR Screening     Status: None   Collection Time: 04/22/14  3:50 AM  Result Value Ref Range Status   MRSA by PCR NEGATIVE NEGATIVE Final    Comment:        The GeneXpert MRSA Assay (FDA approved for NASAL specimens only), is one component of a comprehensive MRSA colonization surveillance program. It is not intended to diagnose MRSA infection nor to guide or monitor treatment for MRSA infections.     Studies/Results: Dg Chest 2 View  04/21/2014   CLINICAL DATA:  Chest pain for 3 days  EXAM: CHEST  2 VIEW  COMPARISON:  04/03/2014  FINDINGS: Heart size and mediastinal contours are normal. Developing opacity within the left upper lobe in the projection of the anterior first rib is identified and appears increased from 04/03/2014. Right lung is clear. No pleural effusion identified.  IMPRESSION: 1. Developing a left upper lobe opacity is likely infectious in etiology. Radiographic followup is recommended to ensure resolution.   Electronically Signed   By: Kerby Moors M.D.   On: 04/21/2014 19:10   Medications:  I have reviewed the patient's current medications. Prior to Admission:  Prescriptions prior to admission  Medication Sig Dispense Refill Last Dose  . albuterol (PROVENTIL HFA;VENTOLIN HFA) 108 (90 BASE) MCG/ACT inhaler Inhale 2 puffs into the lungs every 4 (four) hours as needed for wheezing or shortness of breath.   Past Month at Unknown time  . gabapentin (NEURONTIN) 300 MG capsule Take 300 mg by mouth 3 (three) times daily.    Past Week at Unknown time  . insulin aspart (NOVOLOG) 100 UNIT/ML injection Sliding scale- If sugar 150-199: inject 2 unit;  200-249: 4 units, 250-299: 7 units; 300-349: 10 units; Over 350: 12 units (Patient taking differently: Inject 2-12 Units into the skin 3 (three) times daily with meals. Sliding scale- If sugar 150-199: inject 2 unit;  200-249: 4 units, 250-299: 7 units; 300-349: 10 units; Over 350: 12 units) 10 mL 2 Past Week at Unknown time  . Insulin Glargine (LANTUS SOLOSTAR) 100 UNIT/ML Solostar Pen Inject 50 Units into the skin daily at 10 pm. 15 mL 2 Past Week at Unknown time  . labetalol (NORMODYNE) 100 MG tablet Take 1 tablet (100 mg total) by mouth 2 (two) times daily. 180 tablet 3 Past Week at pt does not rememeber  . LORazepam (ATIVAN) 1 MG tablet Take 1 mg by mouth every 8 (eight) hours as needed for anxiety.   Past Week at Unknown time  .  naproxen sodium (ANAPROX) 220 MG tablet Take 440 mg by mouth 2 (two) times daily as needed (pain).   Past Week at Unknown time  . omeprazole (PRILOSEC) 20 MG capsule Take 1 capsule (20 mg total) by mouth daily. 30 capsule 0 Past Week at Unknown time  . promethazine (PHENERGAN) 25 MG suppository Place 1 suppository (25 mg total) rectally every 6 (six) hours as needed for nausea or vomiting. 12 each 0 Past Week at Unknown time  . senna-docusate (SENOKOT-S) 8.6-50 MG per tablet Take 1 tablet by mouth daily.   Past Week at Unknown time  . atorvastatin (LIPITOR)  80 MG tablet Take 1 tablet (80 mg total) by mouth daily at 6 PM. (Patient not taking: Reported on 03/11/2014) 30 tablet 2 Past Week at Unknown time  . dicyclomine (BENTYL) 20 MG tablet Take 1 tablet (20 mg total) by mouth 2 (two) times daily. (Patient not taking: Reported on 04/03/2014) 20 tablet 0 Past Week at Unknown time   Scheduled Meds: . [START ON 04/23/2014] atorvastatin  40 mg Oral q1800  . ceFEPime (MAXIPIME) IV  1 g Intravenous Q8H  . enoxaparin (LOVENOX) injection  40 mg Subcutaneous Daily  . [START ON 04/23/2014] ferrous sulfate  325 mg Oral TID WC  . gabapentin  300 mg Oral TID  . insulin aspart  0-20 Units Subcutaneous TID WC  . insulin glargine  50 Units Subcutaneous QHS  . pantoprazole (PROTONIX) IV  40 mg Intravenous Q24H  . vancomycin  1,000 mg Intravenous Q8H   Continuous Infusions: . sodium chloride 100 mL/hr at 04/22/14 1207  . sodium chloride     PRN Meds:.acetaminophen **OR** acetaminophen, albuterol, metoCLOPramide (REGLAN) injection, senna-docusate Assessment/Plan: Principal Problem:   HCAP (healthcare-associated pneumonia) Active Problems:   Poorly controlled type 2 diabetes mellitus with peripheral neuropathy   Hyperlipidemia   ANXIETY DEPRESSION   Polysubstance abuse   Essential hypertension   Chronic ischemic heart disease   Asthma   Iron deficiency anemia   NAUSEA AND VOMITING   Chest pain    Hypotension   Diabetic gastroparesis   History of Helicobacter pylori infection  HCAP with Sepsis: Patient was tachycardic on admission with elevated WBC and LUL opacity on chest x-ray.Patient was started on Levaquin in the ED and switched to broader coverage with cefepime and vancomycin.Procalcitonin negative, lactic acid normal at 1.7. Given CAD history will rule out ACS. Troponins negative x 2. Cocaine use could also be contributing to chest pain, UDS positive for cocaine. Negative strep pneumo. Patient has a non-productive cough but denies shortness of breath.  -Telemetry -Cefepime and vancomycin -Blood cultures pending -Legionella antigen pending -RSV panel pending -Repeat UA pending -Hold labetalol in setting of hypotension -Repeat 2 view CXR tomorrow am  Chronic Nausea, Vomiting, Abdominal pain: Patient has a chronic history of N/V/Abdominal pain which has been worked up in the past with many negative CT scans. Patient never followed up with GI and has never had a colonoscopy. 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 We will consult GI for possible colonoscopy and work up for H. Pylori. Pre-albumin normal at 18.3. Patient was seen by GI who recommends EGD to evaluate for H. Pylori. If nonrevealing, consider hepatobiliary scan.  -Reglan PRN given borderline QTc -Continue PPI -H. Pylori antigen pending -FOBT pending -Transglutaminase IgA level pending  Iron Deficiency Anemia: Baseline around 9-10. Admission Hgb 9.6. Ferritin 7 in 07/25/13, but not taking iron. Thrombocytosis thought to be due to  iron deficiency. Iron level low <10, TIBC not calculated, ferritin low-normal at 15, folate and vitamin B12 normal.  -FOBT pending -Ferrous Sulfate 325 mg TID WC, monitor for constipation -Consider starting iron supplementation, IV might be best  Uncontrolled DM2 with peripheral neuropathy: Last A1C 10.3 on 02/18/14. Blood glucose elevated, but no anion gap. -Continue home  Lantus 50 units -CBG AC/HS -SSI resistant -Continue home gabapentin  HLD: LDL elevated at 108 on 07/21/13. -Continue home statin.  Asthma: No wheezing currently. -Albuterol nebs Q6H prn  DVT/PE ppx: Lovenox  Dispo: Disposition is deferred at this time, awaiting improvement of current medical problems.  Anticipated discharge in approximately 2-3 day(s).   The patient does have a current PCP Luan Moore, MD) and does need an [facility] follow-up appointment after discharge.  The patient does not have transportation limitations that hinder transportation to clinic appointments.  .Services Needed at time of discharge: Y = Yes, Blank = No PT:   OT:   RN:   Equipment:   Other:     LOS: 1 day   Osa Craver, DO PGY-1 Internal Medicine Resident Pager # [phone] 04/22/2014 5:09 PM

## 2014-04-22 NOTE — ED Notes (Signed)
Admitting MD at bedside.

## 2014-04-22 NOTE — ED Notes (Signed)
Phlebotomy at bedside.

## 2014-04-22 NOTE — Progress Notes (Signed)
Patient arrived to floor from ED with c/o 7/10 chest pain and 10/10 lower abdominal pain. Patient CBG 340. Dr. Roger Shelter made aware and will place orders. Will continue to monitor patient.

## 2014-04-22 NOTE — Progress Notes (Signed)
Nutrition Brief Note  Patient identified on the Malnutrition Screening Tool (MST) Report  Wt Readings from Last 15 Encounters:  04/22/14 195 lb 14.4 oz (88.86 kg)  04/04/14 206 lb 5.6 oz (93.6 kg)  02/18/14 210 lb 14.4 oz (95.664 kg)  11/24/13 196 lb (88.905 kg)  11/09/13 190 lb (86.183 kg)  08/27/13 205 lb (92.987 kg)  08/09/13 196 lb (88.905 kg)  07/29/13 206 lb 9 oz (93.696 kg)  07/24/13 215 lb 13.3 oz (97.9 kg)  07/21/13 209 lb 8 oz (95.029 kg)  05/08/13 192 lb (87.091 kg)  04/29/13 214 lb 4.8 oz (97.206 kg)  04/22/13 226 lb 8 oz (102.74 kg)  04/14/13 212 lb 8.4 oz (96.4 kg)  11/04/12 233 lb 3.2 oz (105.779 kg)    Body mass index is 30.68 kg/(m^2). Patient meets criteria for obesity based on current BMI.   Current diet order is Heart  Healthy/CHO Mod, patient is consuming approximately 100% of meals at this time. Labs and medications reviewed.   Weight is consistent with her usual weight range of 190-225 lbs over the past 1 year. No nutrition interventions warranted at this time. Note DM coordinator following, see note for details.  If nutrition issues arise, please consult RD.   Brynda Greathouse, MS RD LDN Clinical Inpatient Dietitian Weekend/After hours pager: [phone]

## 2014-04-22 NOTE — Progress Notes (Signed)
Inpatient Diabetes Program Recommendations  AACE/ADA: New Consensus Statement on Inpatient Glycemic Control (2013)  Target Ranges:  Prepandial:   less than 140 mg/dL      Peak postprandial:   less than 180 mg/dL (1-2 hours)      Critically ill patients:  140 - 180 mg/dL   Reason for Visit: Hyperglycemia  Diabetes history: DM2 Outpatient Diabetes medications: Lantus 50 units QHS and Novolog s/s Current orders for Inpatient glycemic control: Lantus 50 units QHS, Novolog resistant tidwc HgbA1C - 10.3% - uncontrolled DM  Patient admitted with N&V and abdominal pain. Has history of DM but does not adhere to her home insulin regimen. Noted from H&P that patient also refuses to check CBGs regularly at home.  Noted also that patient tested Positive for cocaine in last 5 ED visits.  Patient's PCP is Dr. Luan Moore with the Internal Medicine clinic.  Previous note from 04/2013:   Attempted to speak with patient regarding her poor DM control at home. Patient had all the lights off in the room and the blinds drawn closed. Patient would barely speak to me. Attempted to ask patient questions about her home DM care regimen, however, patient appeared very disinterested and would not make eye contact with me. Patient lying on side complaining of abdominal pain during my visit. Will alert RN to my visit with patient. It appears as if (through review of previous records) that patient has Mandella interest in caring for her DM at home.  F/U with Debera Lat , RD, CDE in Internal Medicine Clinic may be helpful.  Will follow daily. Thank you. Lorenda Peck, RD, LDN, CDE Inpatient Diabetes Coordinator [phone]

## 2014-04-23 ENCOUNTER — Encounter (HOSPITAL_COMMUNITY): Payer: Self-pay

## 2014-04-23 ENCOUNTER — Inpatient Hospital Stay (HOSPITAL_COMMUNITY): Payer: Medicaid Other

## 2014-04-23 ENCOUNTER — Encounter (HOSPITAL_COMMUNITY)
Admission: EM | Disposition: A | Discharge status: Home / Self Care | Payer: Self-pay | Source: Home / Self Care | Attending: Internal Medicine

## 2014-04-23 HISTORY — PX: ESOPHAGOGASTRODUODENOSCOPY: SHX5428

## 2014-04-23 LAB — RESPIRATORY VIRUS PANEL
Adenovirus: NOT DETECTED
INFLUENZA A H3: NOT DETECTED
INFLUENZA A: NOT DETECTED
INFLUENZA B 1: NOT DETECTED
Influenza A H1: NOT DETECTED
METAPNEUMOVIRUS: NOT DETECTED
PARAINFLUENZA 1 A: NOT DETECTED
Parainfluenza 2: NOT DETECTED
Parainfluenza 3: NOT DETECTED
RESPIRATORY SYNCYTIAL VIRUS B: NOT DETECTED
Respiratory Syncytial Virus A: NOT DETECTED
Rhinovirus: NOT DETECTED

## 2014-04-23 LAB — CBC
HCT: 25.7 % — ABNORMAL LOW (ref 36.0–46.0)
Hemoglobin: 7.7 g/dL — ABNORMAL LOW (ref 12.0–15.0)
MCH: 18.2 pg — ABNORMAL LOW (ref 26.0–34.0)
MCHC: 30 g/dL (ref 30.0–36.0)
MCV: 60.6 fL — AB (ref 78.0–100.0)
Platelets: 460 10*3/uL — ABNORMAL HIGH (ref 150–400)
RBC: 4.24 MIL/uL (ref 3.87–5.11)
RDW: 18.5 % — ABNORMAL HIGH (ref 11.5–15.5)
WBC: 9.5 10*3/uL (ref 4.0–10.5)

## 2014-04-23 LAB — GLUCOSE, CAPILLARY
GLUCOSE-CAPILLARY: 233 mg/dL — AB (ref 70–99)
GLUCOSE-CAPILLARY: 148 mg/dL — AB (ref 70–99)
Glucose-Capillary: 301 mg/dL — ABNORMAL HIGH (ref 70–99)
Glucose-Capillary: 144 mg/dL — ABNORMAL HIGH (ref 70–99)

## 2014-04-23 LAB — BASIC METABOLIC PANEL
Anion gap: 5 (ref 5–15)
BUN: 9 mg/dL (ref 6–23)
CO2: 22 mmol/L (ref 19–32)
CREATININE: 1 mg/dL (ref 0.50–1.10)
Calcium: 8.3 mg/dL — ABNORMAL LOW (ref 8.4–10.5)
Chloride: 109 mEq/L (ref 96–112)
GFR calc non Af Amer: 69 mL/min — ABNORMAL LOW (ref >90)
GFR, EST AFRICAN AMERICAN: 81 mL/min — AB (ref >90)
GLUCOSE: 334 mg/dL — AB (ref 70–99)
Potassium: 4.1 mmol/L (ref 3.5–5.1)
SODIUM: 136 mmol/L (ref 135–145)

## 2014-04-23 LAB — TISSUE TRANSGLUTAMINASE, IGA: Tissue Transglutaminase Ab, IgA: 1 U/mL (ref <4)

## 2014-04-23 SURGERY — EGD (ESOPHAGOGASTRODUODENOSCOPY)
Anesthesia: Moderate Sedation

## 2014-04-23 MED ORDER — DIPHENHYDRAMINE HCL 50 MG/ML IJ SOLN
INTRAMUSCULAR | Status: AC
  Filled 2014-04-23: qty 1

## 2014-04-23 MED ORDER — VANCOMYCIN HCL IN DEXTROSE 1-5 GM/200ML-% IV SOLN
1000.0000 mg | Freq: Three times a day (TID) | INTRAVENOUS | Status: DC
  Administered 2014-04-23 – 2014-04-24 (×3): 1000 mg via INTRAVENOUS
  Filled 2014-04-23 (×5): qty 200

## 2014-04-23 MED ORDER — INSULIN ASPART 100 UNIT/ML ~~LOC~~ SOLN: 0.0000 [IU] | Freq: Three times a day (TID) | SUBCUTANEOUS | Status: DC

## 2014-04-23 MED ORDER — KETOROLAC TROMETHAMINE 30 MG/ML IJ SOLN
30.0000 mg | Freq: Once | INTRAMUSCULAR | Status: AC
  Administered 2014-04-23: 30 mg via INTRAVENOUS
  Filled 2014-04-23: qty 1

## 2014-04-23 MED ORDER — BUTAMBEN-TETRACAINE-BENZOCAINE 2-2-14 % EX AERO
INHALATION_SPRAY | CUTANEOUS | Status: DC | PRN
Start: 2014-04-23 — End: 2014-04-23
  Administered 2014-04-23: 2 via TOPICAL

## 2014-04-23 MED ORDER — SODIUM CHLORIDE 0.9 % IV SOLN: 1020.0000 mg | Freq: Once | INTRAVENOUS | Status: DC

## 2014-04-23 MED ORDER — DIPHENHYDRAMINE HCL 50 MG/ML IJ SOLN
INTRAMUSCULAR | Status: DC | PRN
  Administered 2014-04-23: 12.5 mg via INTRAVENOUS

## 2014-04-23 MED ORDER — VANCOMYCIN HCL IN DEXTROSE 1-5 GM/200ML-% IV SOLN
1000.0000 mg | Freq: Three times a day (TID) | INTRAVENOUS | Status: DC
  Filled 2014-04-23 (×2): qty 200

## 2014-04-23 MED ORDER — PROMETHAZINE HCL 25 MG/ML IJ SOLN
6.2500 mg | Freq: Four times a day (QID) | INTRAMUSCULAR | Status: DC | PRN
  Administered 2014-04-23 – 2014-04-26 (×5): 6.25 mg via INTRAVENOUS
  Filled 2014-04-23 (×6): qty 1

## 2014-04-23 MED ORDER — MIDAZOLAM HCL 10 MG/2ML IJ SOLN
INTRAMUSCULAR | Status: DC | PRN
  Administered 2014-04-23: 2 mg via INTRAVENOUS
  Administered 2014-04-23: 1 mg via INTRAVENOUS
  Administered 2014-04-23: 2 mg via INTRAVENOUS

## 2014-04-23 MED ORDER — INSULIN ASPART 100 UNIT/ML ~~LOC~~ SOLN
3.0000 [IU] | Freq: Three times a day (TID) | SUBCUTANEOUS | Status: DC
  Administered 2014-04-23: 3 [IU] via SUBCUTANEOUS

## 2014-04-23 MED ORDER — FENTANYL CITRATE 0.05 MG/ML IJ SOLN
INTRAMUSCULAR | Status: AC
  Filled 2014-04-23: qty 2

## 2014-04-23 MED ORDER — MIDAZOLAM HCL 5 MG/ML IJ SOLN
INTRAMUSCULAR | Status: AC
  Filled 2014-04-23: qty 2

## 2014-04-23 MED ORDER — FENTANYL CITRATE 0.05 MG/ML IJ SOLN
INTRAMUSCULAR | Status: DC | PRN
  Administered 2014-04-23 (×3): 25 ug via INTRAVENOUS

## 2014-04-23 NOTE — Op Note (Signed)
[facility] [place], [address]   ENDOSCOPY PROCEDURE REPORT  PATIENT: Shirley [name], Shirley [name]  MR#: [id number] BIRTHDATE: [birth year] , 38  yrs. old GENDER: female ENDOSCOPIST:Kano Heckmann Oletta Lamas, MD REFERRED BY: Jay Schlichter, M.D. PROCEDURE DATE:  05-18-14 PROCEDURE:   EGD w/ biopsy ASA CLASS:    Class III INDICATIONS: persistent nausea and vomiting in a woman with diabetes and multiple medical problems. MEDICATION: Fentanyl 75 mcg IV, Versed 5 mg IV, and Benadryl 12.5 mg IV TOPICAL ANESTHETIC:   Cetacaine Spray  DESCRIPTION OF PROCEDURE:   After the risks and benefits of the procedure were explained, informed consent was obtained.  The Pentax Gastroscope [id number]  endoscope was introduced through the mouth  and advanced to the second portion of the duodenum .  The instrument was slowly withdrawn as the mucosa was fully examined.    ESOPHAGUS: The mucosa of the esophagus appeared normal.   STOMACH: There was a moderate amount of residual food seen in the gastric body and at the gastro-jejunal anastamosis.  Based on this, I suspect the patient has some level of gastroparesis.   Antrum and pylorus normal.  No evidence of gastric outlet obstruction.   A biopsy was performed to evaluate for H. pylori.   Retroflexed views revealed no abnormalities.    The scope was then withdrawn from the patient and the procedure completed.duodenum was normal.  COMPLICATIONS: There were no immediate complications.  ENDOSCOPIC IMPRESSION: 1.   The mucosa of the esophagus appeared normal 2.   Food residue in the gastric body . Probably due to gastroparesis 3.   Antrum and pylorus normal.  No evidence of gastric outlet obstruction 4.   Biopsy was performed RECOMMENDATIONS: Will order gastric emptying scan   _______________________________ eSigned:  Laurence Spates, MD 2014-05-18 11:10 AM     cc:  CPT CODES: ICD CODES:  The ICD and CPT codes  recommended by this software are interpretations from the data that the clinical staff has captured with the software.  The verification of the translation of this report to the ICD and CPT codes and modifiers is the sole responsibility of the health care institution and practicing physician where this report was generated.  [employer] will not be held responsible for the validity of the ICD and CPT codes included on this report.  AMA assumes no liability for data contained or not contained herein. CPT is a Designer, television/film set of the [employer].  PATIENT NAME:  Shirley [name], Shirley [name] MR#: [id number]

## 2014-04-23 NOTE — H&P (View-Only) (Signed)
[facility] CONSULT Reason for consult: nausea and vomiting Referring Physician: Internal Medicine Teaching Service  Shirley [name] is an 38 y.o. female.  HPI: 38 year old woman who states that she has had nausea and vomiting now for approximately 3 to 4 years. This is been progressive and started around 2011. She was diagnosed with H pylori by Dr. Fuller Plan and was treated she said on several different occasions and her H pylori was verified to be adequately treated. She stated that she has been persistently nauseated ever since she developed H pylori. She's had multiple hospital visits, ER visits etc. She's had CT scans of the abdomen ultrasounds multiple x-rays etc. In 2011 she had a gastric emptying scan that was normal. She reports that she continues to vomit at least every week sometimes several days a week. This is causes her to lose weight and she has lost from 420 pounds down to 195 pounds according to her history. She does have diabetes and states that the vomiting is causing her sugar to be difficult to control. Most recent evaluation is shown sludge in her gallbladder. She said several family members and had to have a gallbladder removed. She denies pain in the right upper quadrant but states that she has chronic left lower quadrant abdominal pain. This appears to be unrelated to bowel movements or 2 passage of flatus. She's never had colonoscopy. Her multiple other problems include type I diabetes, CAD with history of previous angioplasty, iron deficiency anemia, iron deficiency, obstructive sleep apnea, fibromyalgia, prior history of hepatitis B, migraine headaches, depression, questionable history of mild stroke. She has neuropathy due to her diabetes which causes lots of pain.  Past Medical History  Diagnosis Date  . Thrombocytosis     Hem/Onc suggested 2/2 chronic hepatits and/or iron deficiency anemia  . Iron deficiency anemia   . N&V (nausea and vomiting)     Chronic. Unclear  etiology with multiple admission and ED visits. CT abdomen with and without contrast (02/2011)  showed no acute process. Gastic Emptying scan (01/2010) was normal. Ultrasound of the abdomen was within normal limits. Hepatitis B viral load was undectable. HIV NR. EGD - gastritis, Hpylori + s/p Rx  . Hypertension   . Hyperlipidemia   . Diabetes mellitus type 2, uncontrolled, with complications   . Recurrent boils   . Back pain   . OSA (obstructive sleep apnea)   . 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 Cath 12/10- no restenosis or significant CAD progression  . Irregular menses     Small ovarian follicles seen on UM(3/53)  . History of pyelonephritis     H/o GrpB Pyelonephritis (9/06) and UTI- 07/11- E.Coli, 12/10- GBS  . Abscess of tunica vaginalis     10/09- Abundant S. aureus- sensitive to all abx  . Obesity   . Asthma   . Gastritis   . Depression   . Hepatitis B, chronic     Hep BeAb+,Hep B cAb+ & Hep BsAg+ (9/06)  . Peripheral neuropathy   . Fibromyalgia   . GERD (gastroesophageal reflux disease)   . Migraine   . Anxiety   . Gastroparesis     secondary to poorly controlled DM, last emptying study performed 01/2010  was normal but may be falsely positive as pt was on reglan  . Blood dyscrasia   . H/O: CVA (cerebrovascular accident)     history  of remote right cerebellar infarct noted on head CT at least since 10/2011  . MI (myocardial infarction)   . Substance abuse   . Cancer     pt states no hx ca 12/31/13    Past Surgical History  Procedure Laterality Date  . Cesarean section  1997    Family History  Problem Relation Age of Onset  . Diabetes Father     Social History:  reports that she quit smoking about 18 years ago. Her smoking use included Cigarettes. She smoked 0.00 packs per day. She has never used smokeless tobacco. She reports that she drinks  alcohol. She reports that she uses illicit drugs (Marijuana and Cocaine).  Allergies:  Allergies  Allergen Reactions  . Lisinopril Nausea Only  . Morphine And Related Itching    Medications; Prior to Admission medications   Medication Sig Start Date End Date Taking? Authorizing Provider  albuterol (PROVENTIL HFA;VENTOLIN HFA) 108 (90 BASE) MCG/ACT inhaler Inhale 2 puffs into the lungs every 4 (four) hours as needed for wheezing or shortness of breath.   Yes Historical Provider, MD  gabapentin (NEURONTIN) 300 MG capsule Take 300 mg by mouth 3 (three) times daily.    Yes Historical Provider, MD  insulin aspart (NOVOLOG) 100 UNIT/ML injection Sliding scale- If sugar 150-199: inject 2 unit;  200-249: 4 units, 250-299: 7 units; 300-349: 10 units; Over 350: 12 units Patient taking differently: Inject 2-12 Units into the skin 3 (three) times daily with meals. Sliding scale- If sugar 150-199: inject 2 unit;  200-249: 4 units, 250-299: 7 units; 300-349: 10 units; Over 350: 12 units 11/11/13  Yes Bartholomew Crews, MD  Insulin Glargine (LANTUS SOLOSTAR) 100 UNIT/ML Solostar Pen Inject 50 Units into the skin daily at 10 pm. 11/11/13  Yes Bartholomew Crews, MD  labetalol (NORMODYNE) 100 MG tablet Take 1 tablet (100 mg total) by mouth 2 (two) times daily. 03/05/14  Yes Karren Cobble, MD  LORazepam (ATIVAN) 1 MG tablet Take 1 mg by mouth every 8 (eight) hours as needed for anxiety.   Yes Historical Provider, MD  naproxen sodium (ANAPROX) 220 MG tablet Take 440 mg by mouth 2 (two) times daily as needed (pain).   Yes Historical Provider, MD  omeprazole (PRILOSEC) 20 MG capsule Take 1 capsule (20 mg total) by mouth daily. 12/31/13  Yes Hoy Morn, MD  promethazine (PHENERGAN) 25 MG suppository Place 1 suppository (25 mg total) rectally every 6 (six) hours as needed for nausea or vomiting. 04/04/14  Yes Jacques Earthly, MD  senna-docusate (SENOKOT-S) 8.6-50 MG per tablet Take 1 tablet by mouth daily.   Yes  Historical Provider, MD  atorvastatin (LIPITOR) 80 MG tablet Take 1 tablet (80 mg total) by mouth daily at 6 PM. Patient not taking: Reported on 03/11/2014 07/25/13   Dixon Boos, MD  dicyclomine (BENTYL) 20 MG tablet Take 1 tablet (20 mg total) by mouth 2 (two) times daily. Patient not taking: Reported on 04/03/2014 12/31/13   Hoy Morn, MD   . Derrill Memo ON 04/23/2014] atorvastatin  40 mg Oral q1800  . ceFEPime (MAXIPIME) IV  1 g Intravenous Q8H  . enoxaparin (LOVENOX) injection  40 mg Subcutaneous Daily  . gabapentin  300 mg Oral TID  . insulin aspart  0-20 Units Subcutaneous TID WC  . insulin glargine  50 Units Subcutaneous QHS  . pantoprazole (PROTONIX) IV  40 mg Intravenous Q24H  . vancomycin  1,000 mg Intravenous Q8H   PRN Meds acetaminophen **  OR** acetaminophen, albuterol, metoCLOPramide (REGLAN) injection, senna-docusate Results for orders placed or performed during the hospital encounter of 04/21/14 (from the past 48 hour(s))  CBC with Differential     Status: Abnormal   Collection Time: 04/21/14  6:20 PM  Result Value Ref Range   WBC 14.9 (H) 4.0 - 10.5 K/uL   RBC 4.96 3.87 - 5.11 MIL/uL   Hemoglobin 9.6 (L) 12.0 - 15.0 g/dL   HCT 30.2 (L) 36.0 - 46.0 %   MCV 60.9 (L) 78.0 - 100.0 fL   MCH 19.4 (L) 26.0 - 34.0 pg   MCHC 31.8 30.0 - 36.0 g/dL   RDW 18.7 (H) 11.5 - 15.5 %   Platelets 512 (H) 150 - 400 K/uL   Neutrophils Relative % 70 43 - 77 %   Lymphocytes Relative 20 12 - 46 %   Monocytes Relative 8 3 - 12 %   Eosinophils Relative 2 0 - 5 %   Basophils Relative 0 0 - 1 %   Neutro Abs 10.4 (H) 1.7 - 7.7 K/uL   Lymphs Abs 3.0 0.7 - 4.0 K/uL   Monocytes Absolute 1.2 (H) 0.1 - 1.0 K/uL   Eosinophils Absolute 0.3 0.0 - 0.7 K/uL   Basophils Absolute 0.0 0.0 - 0.1 K/uL   RBC Morphology POLYCHROMASIA PRESENT     Comment: TARGET CELLS   WBC Morphology TOXIC GRANULATION   Comprehensive metabolic panel     Status: Abnormal   Collection Time: 04/21/14  6:20 PM  Result  Value Ref Range   Sodium 133 (L) 135 - 145 mmol/L    Comment: Please note change in reference range.   Potassium 3.8 3.5 - 5.1 mmol/L    Comment: Please note change in reference range.   Chloride 99 96 - 112 mEq/L   CO2 24 19 - 32 mmol/L   Glucose, Bld 415 (H) 70 - 99 mg/dL   BUN 11 6 - 23 mg/dL   Creatinine, Ser 1.05 0.50 - 1.10 mg/dL   Calcium 9.1 8.4 - 10.5 mg/dL   Total Protein 7.7 6.0 - 8.3 g/dL   Albumin 2.9 (L) 3.5 - 5.2 g/dL   AST 16 0 - 37 U/L   ALT 7 0 - 35 U/L   Alkaline Phosphatase 60 39 - 117 U/L   Total Bilirubin 0.4 0.3 - 1.2 mg/dL   GFR calc non Af Amer 66 (L) >90 mL/min   GFR calc Af Amer 76 (L) >90 mL/min    Comment: (NOTE) The eGFR has been calculated using the CKD EPI equation. This calculation has not been validated in all clinical situations. eGFR's persistently <90 mL/min signify possible Chronic Kidney Disease.    Anion gap 10 5 - 15  Lipase, blood     Status: None   Collection Time: 04/21/14  6:20 PM  Result Value Ref Range   Lipase 36 11 - 59 U/L  I-stat troponin, ED (not at [facility])     Status: None   Collection Time: 04/21/14  6:30 PM  Result Value Ref Range   Troponin i, poc 0.00 0.00 - 0.08 ng/mL   Comment 3            Comment: Due to the release kinetics of cTnI, a negative result within the first hours of the onset of symptoms does not rule out myocardial infarction with certainty. If myocardial infarction is still suspected, repeat the test at appropriate intervals.   I-Stat CG4 Lactic Acid, ED     Status:  Abnormal   Collection Time: 04/21/14 10:34 PM  Result Value Ref Range   Lactic Acid, Venous 2.80 (H) 0.5 - 2.2 mmol/L  I-Stat Beta hCG blood, ED ([facility], [facility], [facility] only)     Status: None   Collection Time: 04/21/14 10:47 PM  Result Value Ref Range   I-stat hCG, quantitative <5.0 <5 mIU/mL   Comment 3            Comment:   GEST. AGE      CONC.  (mIU/mL)   <=1 WEEK        5 - 50     2 WEEKS       50 - 500     3 WEEKS       100 - 10,000      4 WEEKS     1,000 - 30,000        FEMALE AND NON-PREGNANT FEMALE:     LESS THAN 5 mIU/mL   Urine rapid drug screen (hosp performed)     Status: Abnormal   Collection Time: 04/22/14 12:37 AM  Result Value Ref Range   Opiates NONE DETECTED NONE DETECTED   Cocaine POSITIVE (A) NONE DETECTED   Benzodiazepines NONE DETECTED NONE DETECTED   Amphetamines NONE DETECTED NONE DETECTED   Tetrahydrocannabinol NONE DETECTED NONE DETECTED   Barbiturates NONE DETECTED NONE DETECTED    Comment:        DRUG SCREEN FOR MEDICAL PURPOSES ONLY.  IF CONFIRMATION IS NEEDED FOR ANY PURPOSE, NOTIFY LAB WITHIN 5 DAYS.        LOWEST DETECTABLE LIMITS FOR URINE DRUG SCREEN Drug Class       Cutoff (ng/mL) Amphetamine      1000 Barbiturate      200 Benzodiazepine   229 Tricyclics       798 Opiates          300 Cocaine          300 THC              50   Urinalysis, Routine w reflex microscopic     Status: Abnormal   Collection Time: 04/22/14 12:37 AM  Result Value Ref Range   Color, Urine YELLOW YELLOW   APPearance CLOUDY (A) CLEAR   Specific Gravity, Urine 1.041 (H) 1.005 - 1.030   pH 5.5 5.0 - 8.0   Glucose, UA >1000 (A) NEGATIVE mg/dL   Hgb urine dipstick NEGATIVE NEGATIVE   Bilirubin Urine NEGATIVE NEGATIVE   Ketones, ur NEGATIVE NEGATIVE mg/dL   Protein, ur NEGATIVE NEGATIVE mg/dL   Urobilinogen, UA 0.2 0.0 - 1.0 mg/dL   Nitrite NEGATIVE NEGATIVE   Leukocytes, UA NEGATIVE NEGATIVE  Strep pneumoniae urinary antigen     Status: None   Collection Time: 04/22/14 12:37 AM  Result Value Ref Range   Strep Pneumo Urinary Antigen NEGATIVE NEGATIVE    Comment:        Infection due to S. pneumoniae cannot be absolutely ruled out since the antigen present may be below the detection limit of the test.   Urine microscopic-add on     Status: Abnormal   Collection Time: 04/22/14 12:37 AM  Result Value Ref Range   Squamous Epithelial / LPF MANY (A) RARE   WBC, UA 0-2 <3 WBC/hpf   Bacteria, UA MANY  (A) RARE  Troponin I     Status: None   Collection Time: 04/22/14  1:18 AM  Result Value Ref Range   Troponin I <0.03 <0.031 ng/mL  Comment:        NO INDICATION OF MYOCARDIAL INJURY. Please note change in reference range.   Procalcitonin     Status: None   Collection Time: 04/22/14  1:18 AM  Result Value Ref Range   Procalcitonin <0.10 ng/mL    Comment:        Interpretation: PCT (Procalcitonin) <= 0.5 ng/mL: Systemic infection (sepsis) is not likely. Local bacterial infection is possible. (NOTE)         ICU PCT Algorithm               Non ICU PCT Algorithm    ----------------------------     ------------------------------         PCT < 0.25 ng/mL                 PCT < 0.1 ng/mL     Stopping of antibiotics            Stopping of antibiotics       strongly encouraged.               strongly encouraged.    ----------------------------     ------------------------------       PCT level decrease by               PCT < 0.25 ng/mL       >= 80% from peak PCT       OR PCT 0.25 - 0.5 ng/mL          Stopping of antibiotics                                             encouraged.     Stopping of antibiotics           encouraged.    ----------------------------     ------------------------------       PCT level decrease by              PCT >= 0.25 ng/mL       < 80% from peak PCT        AND PCT >= 0.5 ng/mL            Continuin g antibiotics                                              encouraged.       Continuing antibiotics            encouraged.    ----------------------------     ------------------------------     PCT level increase compared          PCT > 0.5 ng/mL         with peak PCT AND          PCT >= 0.5 ng/mL             Escalation of antibiotics                                          strongly encouraged.      Escalation of antibiotics        strongly encouraged.   Ethanol  Status: None   Collection Time: 04/22/14  1:18 AM  Result Value Ref Range   Alcohol,  Ethyl (B) <5 0 - 9 mg/dL    Comment:        LOWEST DETECTABLE LIMIT FOR SERUM ALCOHOL IS 11 mg/dL FOR MEDICAL PURPOSES ONLY   Magnesium     Status: None   Collection Time: 04/22/14  1:18 AM  Result Value Ref Range   Magnesium 1.9 1.5 - 2.5 mg/dL  Vitamin B12     Status: None   Collection Time: 04/22/14  1:18 AM  Result Value Ref Range   Vitamin B-12 490 211 - 911 pg/mL    Comment: Performed at [facility]  Folate     Status: None   Collection Time: 04/22/14  1:18 AM  Result Value Ref Range   Folate 18.1 ng/mL    Comment: (NOTE) Reference Ranges        Deficient:       0.4 - 3.3 ng/mL        Indeterminate:   3.4 - 5.4 ng/mL        Normal:              > 5.4 ng/mL Performed at [facility]   Iron and TIBC     Status: Abnormal   Collection Time: 04/22/14  1:18 AM  Result Value Ref Range   Iron <10 (L) 42 - 145 ug/dL   TIBC Not calculated due to Iron <10. 250 - 470 ug/dL   Saturation Ratios Not calculated due to Iron <10. 20 - 55 %   UIBC 315 125 - 400 ug/dL    Comment: Performed at [facility]  Ferritin     Status: None   Collection Time: 04/22/14  1:18 AM  Result Value Ref Range   Ferritin 15 10 - 291 ng/mL    Comment: Performed at [facility]  Reticulocytes     Status: None   Collection Time: 04/22/14  1:18 AM  Result Value Ref Range   Retic Ct Pct 1.4 0.4 - 3.1 %   RBC. 4.87 3.87 - 5.11 MIL/uL   Retic Count, Manual 68.2 19.0 - 186.0 K/uL  Prealbumin     Status: None   Collection Time: 04/22/14  1:18 AM  Result Value Ref Range   Prealbumin 18.3 17.0 - 34.0 mg/dL    Comment: Performed at [facility]  Lactic acid, plasma     Status: None   Collection Time: 04/22/14  1:19 AM  Result Value Ref Range   Lactic Acid, Venous 1.7 0.5 - 2.2 mmol/L  Glucose, capillary     Status: Abnormal   Collection Time: 04/22/14  1:49 AM  Result Value Ref Range   Glucose-Capillary 340 (H) 70 - 99 mg/dL  Troponin I     Status: None    Collection Time: 04/22/14  3:33 AM  Result Value Ref Range   Troponin I <0.03 <0.031 ng/mL    Comment:        NO INDICATION OF MYOCARDIAL INJURY. Please note change in reference range.   Basic metabolic panel     Status: Abnormal   Collection Time: 04/22/14  3:33 AM  Result Value Ref Range   Sodium 135 135 - 145 mmol/L    Comment: Please note change in reference range.   Potassium 3.5 3.5 - 5.1 mmol/L    Comment: Please note change in reference range.   Chloride 103 96 - 112 mEq/L  CO2 25 19 - 32 mmol/L   Glucose, Bld 262 (H) 70 - 99 mg/dL   BUN 8 6 - 23 mg/dL   Creatinine, Ser 0.87 0.50 - 1.10 mg/dL   Calcium 8.5 8.4 - 10.5 mg/dL   GFR calc non Af Amer 82 (L) >90 mL/min   GFR calc Af Amer >90 >90 mL/min    Comment: (NOTE) The eGFR has been calculated using the CKD EPI equation. This calculation has not been validated in all clinical situations. eGFR's persistently <90 mL/min signify possible Chronic Kidney Disease.    Anion gap 7 5 - 15  CBC WITH DIFFERENTIAL     Status: Abnormal   Collection Time: 04/22/14  3:33 AM  Result Value Ref Range   WBC 11.1 (H) 4.0 - 10.5 K/uL   RBC 4.36 3.87 - 5.11 MIL/uL   Hemoglobin 8.2 (L) 12.0 - 15.0 g/dL   HCT 26.8 (L) 36.0 - 46.0 %   MCV 61.5 (L) 78.0 - 100.0 fL   MCH 18.8 (L) 26.0 - 34.0 pg   MCHC 30.6 30.0 - 36.0 g/dL   RDW 18.6 (H) 11.5 - 15.5 %   Platelets 471 (H) 150 - 400 K/uL   Neutrophils Relative % 65 43 - 77 %   Lymphocytes Relative 25 12 - 46 %   Monocytes Relative 8 3 - 12 %   Eosinophils Relative 2 0 - 5 %   Basophils Relative 0 0 - 1 %   Neutro Abs 7.2 1.7 - 7.7 K/uL   Lymphs Abs 2.8 0.7 - 4.0 K/uL   Monocytes Absolute 0.9 0.1 - 1.0 K/uL   Eosinophils Absolute 0.2 0.0 - 0.7 K/uL   Basophils Absolute 0.0 0.0 - 0.1 K/uL   RBC Morphology TARGET CELLS   MRSA PCR Screening     Status: None   Collection Time: 04/22/14  3:50 AM  Result Value Ref Range   MRSA by PCR NEGATIVE NEGATIVE    Comment:        The GeneXpert  MRSA Assay (FDA approved for NASAL specimens only), is one component of a comprehensive MRSA colonization surveillance program. It is not intended to diagnose MRSA infection nor to guide or monitor treatment for MRSA infections.   Glucose, capillary     Status: None   Collection Time: 04/22/14  7:22 AM  Result Value Ref Range   Glucose-Capillary 76 70 - 99 mg/dL  Glucose, capillary     Status: Abnormal   Collection Time: 04/22/14 11:13 AM  Result Value Ref Range   Glucose-Capillary 186 (H) 70 - 99 mg/dL    Dg Chest 2 View  04/21/2014   CLINICAL DATA:  Chest pain for 3 days  EXAM: CHEST  2 VIEW  COMPARISON:  04/03/2014  FINDINGS: Heart size and mediastinal contours are normal. Developing opacity within the left upper lobe in the projection of the anterior first rib is identified and appears increased from 04/03/2014. Right lung is clear. No pleural effusion identified.  IMPRESSION: 1. Developing a left upper lobe opacity is likely infectious in etiology. Radiographic followup is recommended to ensure resolution.   Electronically Signed   By: Kerby Moors M.D.   On: 04/21/2014 19:10               Blood pressure 132/77, pulse 94, temperature 97.6 F (36.4 C), temperature source Oral, resp. rate 16, height _0  (1.702 m), weight 88.86 kg (195 lb 14.4 oz), last menstrual period 03/24/2014, SpO2 100 %.  Physical  exam:   General--Pleasant African-American female. No acute distress. Sitting in bed watching television. ENT-- no oral thrush Neck-- no lymphadenopathy Heart-- regular rate and rhythm without murmurs are gallops Lungs--clear Abdomen-- none distended in soft with good bowel sounds. Generally nontender. Indicates left lower quadrant is area tenderness. No right upper quadrant tenderness. Psych-- alert and oriented and appears appropriate   Assessment: 1. Chronic nausea vomiting and weight loss. The patient states all of this is due to H pylori. She reports no  other medications of helped her including metoclopramide. She does have some sludge in a gallbladder but it's not really clear if this is causing the problem or not. 2. Multiple chronic problems as noted above  Plan: 1. We will start off with EGD to evaluate for H. pylori. Have discussed this with patient. If this is nonrevealing we might want to consider hepatobiliary scan.   Ramsey Guadamuz JR,Taquana Bartley L 04/22/2014, 3:20 PM

## 2014-04-23 NOTE — Interval H&P Note (Signed)
History and Physical Interval Note:  04/23/2014 9:28 AM  Shirley [name]  has presented today for surgery, with the diagnosis of N+V,  The various methods of treatment have been discussed with the patient and family. After consideration of risks, benefits and other options for treatment, the patient has consented to  Procedure(s): ESOPHAGOGASTRODUODENOSCOPY (EGD) (N/A) as a surgical intervention .  [name] history has been reviewed, patient examined, no change in status, stable for surgery.  I have reviewed the patient's chart and labs.  Questions were answered to the patient's satisfaction.     Bren Borys JR,Aubriel Khanna L

## 2014-04-23 NOTE — Progress Notes (Signed)
Pt sent back from Radiology unable to perform gastric emptying test secondary to refusal to eat egg or oatmeal, per technician hands off report. States  not allergic to these food items but "not want to eat" Also refusing to consume ordered  Liquid diet saying will get real foods from outside.

## 2014-04-23 NOTE — Progress Notes (Signed)
Subjective:  Patient was seen and examined this afternoon. Patient was groggy after EGD procedure, but able to answer my questions. She denies any nausea, vomiting, or pain. Denies cough, shortness of breath or chest pain.  Objective: Filed Vitals:   04/23/14 1100 04/23/14 1105 04/23/14 1107 04/23/14 1110  BP: 135/85 115/86 115/86 132/82  Pulse: 95 92 98 93  Temp:      TempSrc:      Resp: 24 15 24 16   Height:      Weight:      SpO2: 100% 100% 100% 100%   General: Vital signs reviewed.  Patient is well-developed and well-nourished, in no acute distress and cooperative with exam.  Cardiovascular: RRR, S1 normal, S2 normal, no murmurs, gallops, or rubs. Pulmonary/Chest: Clear to auscultation bilaterally, no wheezes, rales, or rhonchi. Abdominal: Soft, tender to palpation in LLQ, non-distended, BS +, no masses, organomegaly, or guarding present.  Extremities: No lower extremity edema bilaterally Neurological: A&O x3 Psychiatric: Normal mood and affect. speech and behavior is normal. Cognition and memory are normal.   Lab Results: Basic Metabolic Panel:  Recent Labs Lab 04/22/14 0118 04/22/14 0333 04/23/14 0425  NA  --  135 136  K  --  3.5 4.1  CL  --  103 109  CO2  --  25 22  GLUCOSE  --  262* 334*  BUN  --  8 9  CREATININE  --  0.87 1.00  CALCIUM  --  8.5 8.3*  MG 1.9  --   --    Liver Function Tests:  Recent Labs Lab 04/21/14 1820  AST 16  ALT 7  ALKPHOS 60  BILITOT 0.4  PROT 7.7  ALBUMIN 2.9*    Recent Labs Lab 04/21/14 1820  LIPASE 36   CBC:  Recent Labs Lab 04/21/14 1820 04/22/14 0333 04/23/14 0425  WBC 14.9* 11.1* 9.5  NEUTROABS 10.4* 7.2  --   HGB 9.6* 8.2* 7.7*  HCT 30.2* 26.8* 25.7*  MCV 60.9* 61.5* 60.6*  PLT 512* 471* 460*   Cardiac Enzymes:  Recent Labs Lab 04/22/14 0118 04/22/14 0333  TROPONINI <0.03 <0.03   CBG:  Recent Labs Lab 04/22/14 0722 04/22/14 1113 04/22/14 1640 04/22/14 2057 04/23/14 0744 04/23/14 1149    GLUCAP 76 186* 306* 157* 233* 148*   Anemia Panel:  Recent Labs Lab 04/22/14 0118  VITAMINB12 490  FOLATE 18.1  FERRITIN 15  TIBC Not calculated due to Iron <10.  IRON <10*  RETICCTPCT 1.4   Urine Drug Screen: Drugs of Abuse     Component Value Date/Time   LABOPIA NONE DETECTED 04/22/2014 0037   LABOPIA NEGATIVE 02/21/2010 2201   COCAINSCRNUR POSITIVE* 04/22/2014 0037   COCAINSCRNUR NEGATIVE 02/21/2010 2201   LABBENZ NONE DETECTED 04/22/2014 0037   LABBENZ NEGATIVE 02/21/2010 2201   AMPHETMU NONE DETECTED 04/22/2014 0037   AMPHETMU NEGATIVE 02/21/2010 2201   THCU NONE DETECTED 04/22/2014 0037   LABBARB NONE DETECTED 04/22/2014 0037    Alcohol Level:  Recent Labs Lab 04/22/14 0118  ETH <5   Urinalysis:  Recent Labs Lab 04/22/14 0037  COLORURINE YELLOW  LABSPEC 1.041*  PHURINE 5.5  GLUCOSEU >1000*  HGBUR NEGATIVE  BILIRUBINUR NEGATIVE  KETONESUR NEGATIVE  PROTEINUR NEGATIVE  UROBILINOGEN 0.2  NITRITE NEGATIVE  LEUKOCYTESUR NEGATIVE    Micro Results: Recent Results (from the past 240 hour(s))  Culture, blood (routine x 2)     Status: None (Preliminary result)   Collection Time: 04/22/14 12:02 AM  Result Value Ref  Range Status   Specimen Description BLOOD LEFT ANTECUBITAL  Final   Special Requests BOTTLES DRAWN AEROBIC AND ANAEROBIC 10CC EA  Final   Culture   Final           BLOOD CULTURE RECEIVED NO GROWTH TO DATE CULTURE WILL BE HELD FOR 5 DAYS BEFORE ISSUING A FINAL NEGATIVE REPORT Performed at [facility]    Report Status PENDING  Incomplete  Culture, blood (routine x 2)     Status: None (Preliminary result)   Collection Time: 04/22/14 12:08 AM  Result Value Ref Range Status   Specimen Description BLOOD LEFT HAND  Final   Special Requests BOTTLES DRAWN AEROBIC AND ANAEROBIC 5CC EA  Final   Culture   Final           BLOOD CULTURE RECEIVED NO GROWTH TO DATE CULTURE WILL BE HELD FOR 5 DAYS BEFORE ISSUING A FINAL NEGATIVE  REPORT Performed at [facility]    Report Status PENDING  Incomplete  MRSA PCR Screening     Status: None   Collection Time: 04/22/14  3:50 AM  Result Value Ref Range Status   MRSA by PCR NEGATIVE NEGATIVE Final    Comment:        The GeneXpert MRSA Assay (FDA approved for NASAL specimens only), is one component of a comprehensive MRSA colonization surveillance program. It is not intended to diagnose MRSA infection nor to guide or monitor treatment for MRSA infections.    Studies/Results: Dg Chest 2 View  04/23/2014   CLINICAL DATA:  Cough, chest pain.  EXAM: CHEST  2 VIEW  COMPARISON:  04/21/2014  FINDINGS: Stable cardiac and mediastinal contours. Interval development of consolidative opacities within the peripheral right mid and lower lung. Interval worsening of opacities within the left upper lung. No definite pleural effusion or pneumothorax. Mid thoracic spine degenerative changes. Leads overlie the patient.  IMPRESSION: Interval development of consolidative opacities within the peripheral right mid and lower lung as well as increasing consolidative opacities within the left upper lung, concerning for multi focal pneumonia. Continued radiographic followup is recommended to ensure resolution and exclude the possibility of underlying mass.   Electronically Signed   By: Lovey Newcomer M.D.   On: 04/23/2014 08:02   Dg Chest 2 View  04/21/2014   CLINICAL DATA:  Chest pain for 3 days  EXAM: CHEST  2 VIEW  COMPARISON:  04/03/2014  FINDINGS: Heart size and mediastinal contours are normal. Developing opacity within the left upper lobe in the projection of the anterior first rib is identified and appears increased from 04/03/2014. Right lung is clear. No pleural effusion identified.  IMPRESSION: 1. Developing a left upper lobe opacity is likely infectious in etiology. Radiographic followup is recommended to ensure resolution.   Electronically Signed   By: Kerby Moors M.D.   On:  04/21/2014 19:10   Medications:  I have reviewed the patient's current medications. Prior to Admission:  Prescriptions prior to admission  Medication Sig Dispense Refill Last Dose  . albuterol (PROVENTIL HFA;VENTOLIN HFA) 108 (90 BASE) MCG/ACT inhaler Inhale 2 puffs into the lungs every 4 (four) hours as needed for wheezing or shortness of breath.   Past Month at Unknown time  . gabapentin (NEURONTIN) 300 MG capsule Take 300 mg by mouth 3 (three) times daily.    Past Week at Unknown time  . insulin aspart (NOVOLOG) 100 UNIT/ML injection Sliding scale- If sugar 150-199: inject 2 unit;  200-249: 4 units, 250-299: 7 units;  300-349: 10 units; Over 350: 12 units (Patient taking differently: Inject 2-12 Units into the skin 3 (three) times daily with meals. Sliding scale- If sugar 150-199: inject 2 unit;  200-249: 4 units, 250-299: 7 units; 300-349: 10 units; Over 350: 12 units) 10 mL 2 Past Week at Unknown time  . Insulin Glargine (LANTUS SOLOSTAR) 100 UNIT/ML Solostar Pen Inject 50 Units into the skin daily at 10 pm. 15 mL 2 Past Week at Unknown time  . labetalol (NORMODYNE) 100 MG tablet Take 1 tablet (100 mg total) by mouth 2 (two) times daily. 180 tablet 3 Past Week at pt does not rememeber  . LORazepam (ATIVAN) 1 MG tablet Take 1 mg by mouth every 8 (eight) hours as needed for anxiety.   Past Week at Unknown time  . naproxen sodium (ANAPROX) 220 MG tablet Take 440 mg by mouth 2 (two) times daily as needed (pain).   Past Week at Unknown time  . omeprazole (PRILOSEC) 20 MG capsule Take 1 capsule (20 mg total) by mouth daily. 30 capsule 0 Past Week at Unknown time  . promethazine (PHENERGAN) 25 MG suppository Place 1 suppository (25 mg total) rectally every 6 (six) hours as needed for nausea or vomiting. 12 each 0 Past Week at Unknown time  . senna-docusate (SENOKOT-S) 8.6-50 MG per tablet Take 1 tablet by mouth daily.   Past Week at Unknown time  . atorvastatin (LIPITOR) 80 MG tablet Take 1 tablet (80  mg total) by mouth daily at 6 PM. (Patient not taking: Reported on 03/11/2014) 30 tablet 2 Past Week at Unknown time  . dicyclomine (BENTYL) 20 MG tablet Take 1 tablet (20 mg total) by mouth 2 (two) times daily. (Patient not taking: Reported on 04/03/2014) 20 tablet 0 Past Week at Unknown time   Scheduled Meds: . atorvastatin  40 mg Oral q1800  . ceFEPime (MAXIPIME) IV  1 g Intravenous Q8H  . enoxaparin (LOVENOX) injection  40 mg Subcutaneous Daily  . ferrous sulfate  325 mg Oral TID WC  . ferumoxytol  1,020 mg Intravenous Once  . gabapentin  300 mg Oral TID  . insulin aspart  0-20 Units Subcutaneous TID WC  . insulin aspart  3 Units Subcutaneous TID WC  . insulin glargine  50 Units Subcutaneous QHS  . pantoprazole (PROTONIX) IV  40 mg Intravenous Q24H  . vancomycin  1,000 mg Intravenous Q8H   Continuous Infusions:   PRN Meds:.acetaminophen **OR** acetaminophen, albuterol, metoCLOPramide (REGLAN) injection, senna-docusate Assessment/Plan: Principal Problem:   HCAP (healthcare-associated pneumonia) Active Problems:   Poorly controlled type 2 diabetes mellitus with peripheral neuropathy   Hyperlipidemia   OBESITY, MORBID   ANXIETY DEPRESSION   Polysubstance abuse   Essential hypertension   Chronic ischemic heart disease   Asthma   Iron deficiency anemia   NAUSEA AND VOMITING   Chest pain   Hypotension   History of Helicobacter pylori infection  HCAP with Sepsis: Repeat CXR shows interval development of consolidative opacities within the peripheral right mid and lower lung as well as increasing consolidative opacities within the left upper lung, concerning for multi focal pneumonia. Continued radiographic followup is recommended to ensure resolution and exclude the possibility of underlying mass.  -Telemetry -Cefepime and vancomycin -Blood cultures pending -Legionella antigen pending -RSV panel pending -Repeat UA pending -Hold labetalol in setting of  hypotension  Gastroparesis with N/V/LLQ pain: Patient tolerated lunch yesterday well. EGD showed food retention probably due to gastroparesis. Biopsies were taken. GI recommends gastric emptying scan.  -  Reglan PRN given borderline QTc -Continue PPI -H. Pylori antigen pending -FOBT pending -Transglutaminase IgA level pending -IgA in process  -Gastric emptying study  Iron Deficiency Anemia: Baseline around 9-10. Admission Hgb 9.6>7.7. Ferritin 7 in 07/25/13, but not taking iron. Thrombocytosis thought to be due to iron deficiency. Iron level low <10, TIBC not calculated, ferritin low-normal at 15, folate and vitamin B12 normal.  -FOBT pending -Ferrous Sulfate 325 mg TID WC, monitor for constipation -Fereheme 1,020 mg once  Uncontrolled DM2 with peripheral neuropathy: Last A1C 10.3 on 02/18/14.CBGs 100-300.  -Continue home Lantus 50 units -CBG AC/HS -SSI resistant -Added Novolog 3 units TID WC -Continue home gabapentin  DVT/PE ppx: Lovenox  Dispo: Disposition is deferred at this time, awaiting improvement of current medical problems.  Anticipated discharge in approximately 2-3 day(s).   The patient does have a current PCP Luan Moore, MD) and does need an [facility] hospital follow-up appointment after discharge.  The patient does not have transportation limitations that hinder transportation to clinic appointments.  .Services Needed at time of discharge: Y = Yes, Blank = No PT:   OT:   RN:   Equipment:   Other:     LOS: 2 days   Osa Craver, DO PGY-1 Internal Medicine Resident Pager # [phone] 04/23/2014 12:40 PM

## 2014-04-24 ENCOUNTER — Encounter (HOSPITAL_COMMUNITY): Payer: Self-pay | Admitting: Gastroenterology

## 2014-04-24 DIAGNOSIS — K3184 Gastroparesis: Secondary | ICD-10-CM

## 2014-04-24 DIAGNOSIS — R1031 Right lower quadrant pain: Secondary | ICD-10-CM

## 2014-04-24 DIAGNOSIS — N760 Acute vaginitis: Secondary | ICD-10-CM

## 2014-04-24 LAB — GLUCOSE, CAPILLARY
GLUCOSE-CAPILLARY: 344 mg/dL — AB (ref 70–99)
GLUCOSE-CAPILLARY: 343 mg/dL — AB (ref 70–99)
GLUCOSE-CAPILLARY: 293 mg/dL — AB (ref 70–99)
Glucose-Capillary: 181 mg/dL — ABNORMAL HIGH (ref 70–99)
Glucose-Capillary: 420 mg/dL — ABNORMAL HIGH (ref 70–99)

## 2014-04-24 LAB — URINALYSIS, ROUTINE W REFLEX MICROSCOPIC
Bilirubin Urine: NEGATIVE
HGB URINE DIPSTICK: NEGATIVE
KETONES UR: NEGATIVE mg/dL
Leukocytes, UA: NEGATIVE
Nitrite: NEGATIVE
PH: 5.5 (ref 5.0–8.0)
Protein, ur: NEGATIVE mg/dL
Specific Gravity, Urine: 1.019 (ref 1.005–1.030)
Urobilinogen, UA: 0.2 mg/dL (ref 0.0–1.0)

## 2014-04-24 LAB — BASIC METABOLIC PANEL
Anion gap: 8 (ref 5–15)
BUN: 9 mg/dL (ref 6–23)
CO2: 20 mmol/L (ref 19–32)
Calcium: 8.5 mg/dL (ref 8.4–10.5)
Chloride: 108 mEq/L (ref 96–112)
Creatinine, Ser: 0.99 mg/dL (ref 0.50–1.10)
GFR calc non Af Amer: 70 mL/min — ABNORMAL LOW (ref >90)
GFR, EST AFRICAN AMERICAN: 82 mL/min — AB (ref >90)
GLUCOSE: 398 mg/dL — AB (ref 70–99)
Potassium: 4.1 mmol/L (ref 3.5–5.1)
SODIUM: 136 mmol/L (ref 135–145)

## 2014-04-24 LAB — CBC
HCT: 27.2 % — ABNORMAL LOW (ref 36.0–46.0)
HEMOGLOBIN: 8.4 g/dL — AB (ref 12.0–15.0)
MCH: 19.1 pg — ABNORMAL LOW (ref 26.0–34.0)
MCHC: 30.9 g/dL (ref 30.0–36.0)
MCV: 61.8 fL — ABNORMAL LOW (ref 78.0–100.0)
PLATELETS: 496 10*3/uL — AB (ref 150–400)
RBC: 4.4 MIL/uL (ref 3.87–5.11)
RDW: 18.9 % — ABNORMAL HIGH (ref 11.5–15.5)
WBC: 11.8 10*3/uL — ABNORMAL HIGH (ref 4.0–10.5)

## 2014-04-24 LAB — URINE MICROSCOPIC-ADD ON

## 2014-04-24 MED ORDER — METRONIDAZOLE 500 MG PO TABS
500.0000 mg | ORAL_TABLET | Freq: Two times a day (BID) | ORAL | Status: DC
  Administered 2014-04-24 – 2014-04-26 (×5): 500 mg via ORAL
  Filled 2014-04-24 (×6): qty 1

## 2014-04-24 MED ORDER — SENNOSIDES-DOCUSATE SODIUM 8.6-50 MG PO TABS
1.0000 | ORAL_TABLET | Freq: Every day | ORAL | Status: DC
  Administered 2014-04-25: 1 via ORAL
  Filled 2014-04-24: qty 1

## 2014-04-24 MED ORDER — CLINDAMYCIN PHOSPHATE 2 % VA CREA
1.0000 | TOPICAL_CREAM | Freq: Every day | VAGINAL | Status: DC
  Filled 2014-04-24: qty 40

## 2014-04-24 MED ORDER — INSULIN ASPART 100 UNIT/ML ~~LOC~~ SOLN
7.0000 [IU] | Freq: Once | SUBCUTANEOUS | Status: AC
  Administered 2014-04-24: 7 [IU] via SUBCUTANEOUS

## 2014-04-24 MED ORDER — METOCLOPRAMIDE HCL 5 MG/ML IJ SOLN
5.0000 mg | Freq: Four times a day (QID) | INTRAMUSCULAR | Status: DC
  Administered 2014-04-24 – 2014-04-25 (×5): 5 mg via INTRAVENOUS
  Filled 2014-04-24 (×5): qty 2

## 2014-04-24 MED ORDER — SENNOSIDES-DOCUSATE SODIUM 8.6-50 MG PO TABS: 1.0000 | ORAL_TABLET | Freq: Every day | ORAL | Status: DC

## 2014-04-24 MED ORDER — SODIUM CHLORIDE 0.9 % IV SOLN
INTRAVENOUS | Status: AC
  Administered 2014-04-24 (×2): via INTRAVENOUS

## 2014-04-24 MED ORDER — TRAMADOL HCL 50 MG PO TABS
50.0000 mg | ORAL_TABLET | Freq: Four times a day (QID) | ORAL | Status: DC | PRN
  Administered 2014-04-24: 50 mg via ORAL
  Filled 2014-04-24: qty 1

## 2014-04-24 MED ORDER — KETOROLAC TROMETHAMINE 30 MG/ML IJ SOLN
30.0000 mg | Freq: Once | INTRAMUSCULAR | Status: AC
  Administered 2014-04-24: 30 mg via INTRAVENOUS
  Filled 2014-04-24: qty 1

## 2014-04-24 MED ORDER — LABETALOL HCL 100 MG PO TABS
100.0000 mg | ORAL_TABLET | Freq: Two times a day (BID) | ORAL | Status: DC
  Administered 2014-04-24 – 2014-04-26 (×5): 100 mg via ORAL
  Filled 2014-04-24 (×5): qty 1

## 2014-04-24 MED ORDER — SENNOSIDES-DOCUSATE SODIUM 8.6-50 MG PO TABS
2.0000 | ORAL_TABLET | Freq: Once | ORAL | Status: AC
  Administered 2014-04-24: 2 via ORAL
  Filled 2014-04-24: qty 2

## 2014-04-24 MED ORDER — INSULIN ASPART 100 UNIT/ML ~~LOC~~ SOLN
5.0000 [IU] | Freq: Three times a day (TID) | SUBCUTANEOUS | Status: DC
Start: 2014-04-24 — End: 2014-04-25
  Administered 2014-04-24 (×3): 5 [IU] via SUBCUTANEOUS

## 2014-04-24 MED ORDER — SODIUM CHLORIDE 0.9 % IV SOLN
1020.0000 mg | Freq: Once | INTRAVENOUS | Status: AC
  Administered 2014-04-24: 1020 mg via INTRAVENOUS
  Filled 2014-04-24: qty 34

## 2014-04-24 MED ORDER — HYDROMORPHONE HCL 1 MG/ML IJ SOLN
1.0000 mg | Freq: Once | INTRAMUSCULAR | Status: AC
  Administered 2014-04-24: 1 mg via INTRAVENOUS
  Filled 2014-04-24: qty 1

## 2014-04-24 NOTE — Progress Notes (Signed)
[facility] Progress Note  Subjective: Still nauseated but getting a regular diet. Refused nuclear medicine gastric emptying study because she would eat neither eggs nor oatmeal.  Objective: Vital signs in last 24 hours: Temp:  [99.1 F (37.3 C)] 99.1 F (37.3 C) (01/01 0549) Pulse Rate:  [98-125] 99 (01/01 1053) Resp:  [16-20] 16 (01/01 0549) BP: (117-142)/(68-91) 129/68 mmHg (01/01 1053) SpO2:  [100 %] 100 % (01/01 0549) Weight:  [91.808 kg (202 lb 6.4 oz)] 91.808 kg (202 lb 6.4 oz) (01/01 0549) Weight change:    PE: Unchanged  Lab Results: Results for orders placed or performed during the hospital encounter of 04/21/14 (from the past 24 hour(s))  Glucose, capillary     Status: Abnormal   Collection Time: 04/23/14  5:01 PM  Result Value Ref Range   Glucose-Capillary 301 (H) 70 - 99 mg/dL  Glucose, capillary     Status: Abnormal   Collection Time: 04/23/14  9:28 PM  Result Value Ref Range   Glucose-Capillary 144 (H) 70 - 99 mg/dL  CBC     Status: Abnormal   Collection Time: 04/24/14  5:06 AM  Result Value Ref Range   WBC 11.8 (H) 4.0 - 10.5 K/uL   RBC 4.40 3.87 - 5.11 MIL/uL   Hemoglobin 8.4 (L) 12.0 - 15.0 g/dL   HCT 27.2 (L) 36.0 - 46.0 %   MCV 61.8 (L) 78.0 - 100.0 fL   MCH 19.1 (L) 26.0 - 34.0 pg   MCHC 30.9 30.0 - 36.0 g/dL   RDW 18.9 (H) 11.5 - 15.5 %   Platelets 496 (H) 150 - 400 K/uL  Basic metabolic panel     Status: Abnormal   Collection Time: 04/24/14  5:06 AM  Result Value Ref Range   Sodium 136 135 - 145 mmol/L   Potassium 4.1 3.5 - 5.1 mmol/L   Chloride 108 96 - 112 mEq/L   CO2 20 19 - 32 mmol/L   Glucose, Bld 398 (H) 70 - 99 mg/dL   BUN 9 6 - 23 mg/dL   Creatinine, Ser 0.99 0.50 - 1.10 mg/dL   Calcium 8.5 8.4 - 10.5 mg/dL   GFR calc non Af Amer 70 (L) >90 mL/min   GFR calc Af Amer 82 (L) >90 mL/min   Anion gap 8 5 - 15  Urinalysis, Routine w reflex microscopic     Status: Abnormal   Collection Time: 04/24/14  6:34 AM  Result Value  Ref Range   Color, Urine YELLOW YELLOW   APPearance CLEAR CLEAR   Specific Gravity, Urine 1.019 1.005 - 1.030   pH 5.5 5.0 - 8.0   Glucose, UA >1000 (A) NEGATIVE mg/dL   Hgb urine dipstick NEGATIVE NEGATIVE   Bilirubin Urine NEGATIVE NEGATIVE   Ketones, ur NEGATIVE NEGATIVE mg/dL   Protein, ur NEGATIVE NEGATIVE mg/dL   Urobilinogen, UA 0.2 0.0 - 1.0 mg/dL   Nitrite NEGATIVE NEGATIVE   Leukocytes, UA NEGATIVE NEGATIVE  Urine microscopic-add on     Status: None   Collection Time: 04/24/14  6:34 AM  Result Value Ref Range   Squamous Epithelial / LPF RARE RARE   WBC, UA 0-2 <3 WBC/hpf  Glucose, capillary     Status: Abnormal   Collection Time: 04/24/14  7:35 AM  Result Value Ref Range   Glucose-Capillary 344 (H) 70 - 99 mg/dL   Comment 1 Documented in Chart    Comment 2 Notify RN   Glucose, capillary     Status: Abnormal  Collection Time: 04/24/14 11:14 AM  Result Value Ref Range   Glucose-Capillary 181 (H) 70 - 99 mg/dL   Comment 1 Documented in Chart    Comment 2 Notify RN     Studies/Results: Dg Chest 2 View  04/23/2014   CLINICAL DATA:  Cough, chest pain.  EXAM: CHEST  2 VIEW  COMPARISON:  04/21/2014  FINDINGS: Stable cardiac and mediastinal contours. Interval development of consolidative opacities within the peripheral right mid and lower lung. Interval worsening of opacities within the left upper lung. No definite pleural effusion or pneumothorax. Mid thoracic spine degenerative changes. Leads overlie the patient.  IMPRESSION: Interval development of consolidative opacities within the peripheral right mid and lower lung as well as increasing consolidative opacities within the left upper lung, concerning for multi focal pneumonia. Continued radiographic followup is recommended to ensure resolution and exclude the possibility of underlying mass.   Electronically Signed   By: Lovey Newcomer M.D.   On: 04/23/2014 08:02      Assessment: Nausea and vomiting with retained gastric  contents on EGD yesterday with no mechanical obstruction, and a chronic diabetic strongly suspicious for diabetic gastroparesis.  Plan: Will begin low dose IV Reglan 5 mg every 6 hours and advanced to 10 mg if tolerated.    Davis Ambrosini C 04/24/2014, 11:56 AM

## 2014-04-24 NOTE — Progress Notes (Signed)
Subjective:  Patient was seen and examined this morning. Patient states she is doing okay-complains of LLQ pain 10/10 and minimal nausea. She has not had a bowel movement since admission, but feels like she has to go. Whenever she goes to the restroom, she passes gas, but no BM. She stays constipated at home and only has hard stools. She has a BM about twice a week. She also feels dizzy when she stands up and feels as if her heart is racing. She denies any fever, chills, shortness of breath, chest pain or vomiting.    Objective: Filed Vitals:   04/23/14 1110 04/23/14 1949 04/24/14 0549 04/24/14 1053  BP: 132/82 142/91 117/68 129/68  Pulse: 93 125 98 99  Temp:  99.1 F (37.3 C) 99.1 F (37.3 C)   TempSrc:  Oral Oral   Resp: _0 Height:      Weight:   91.808 kg (202 lb 6.4 oz)   SpO2: 100% 100% 100%    General: Vital signs reviewed.  Patient is well-developed and well-nourished, in no acute distress and cooperative with exam.  Cardiovascular: Tachycardic, regular rhythm, S1 normal, S2 normal, no murmurs, gallops, or rubs. Pulmonary/Chest: Clear to auscultation bilaterally, no wheezes, rales, or rhonchi. Abdominal: Soft, tender to palpation in LLQ, non-distended, active BS, no masses, organomegaly, or guarding present.  Extremities: No lower extremity edema bilaterally Neurological: A&O x3 Psychiatric: Normal mood and affect. speech and behavior is normal. Cognition and memory are normal.   Lab Results: Basic Metabolic Panel:  Recent Labs Lab 04/22/14 0118  04/23/14 0425 04/24/14 0506  NA  --   < > 136 136  K  --   < > 4.1 4.1  CL  --   < > 109 108  CO2  --   < > 22 20  GLUCOSE  --   < > 334* 398*  BUN  --   < > 9 9  CREATININE  --   < > 1.00 0.99  CALCIUM  --   < > 8.3* 8.5  MG 1.9  --   --   --   < > = values in this interval not displayed. Liver Function Tests:  Recent Labs Lab 04/21/14 1820  AST 16  ALT 7  ALKPHOS 60  BILITOT 0.4  PROT 7.7  ALBUMIN  2.9*    Recent Labs Lab 04/21/14 1820  LIPASE 36   CBC:  Recent Labs Lab 04/21/14 1820 04/22/14 0333 04/23/14 0425 04/24/14 0506  WBC 14.9* 11.1* 9.5 11.8*  NEUTROABS 10.4* 7.2  --   --   HGB 9.6* 8.2* 7.7* 8.4*  HCT 30.2* 26.8* 25.7* 27.2*  MCV 60.9* 61.5* 60.6* 61.8*  PLT 512* 471* 460* 496*   Cardiac Enzymes:  Recent Labs Lab 04/22/14 0118 04/22/14 0333  TROPONINI <0.03 <0.03   CBG:  Recent Labs Lab 04/23/14 0744 04/23/14 1149 04/23/14 1701 04/23/14 2128 04/24/14 0735 04/24/14 1114  GLUCAP 233* 148* 301* 144* 344* 181*   Anemia Panel:  Recent Labs Lab 04/22/14 0118  VITAMINB12 490  FOLATE 18.1  FERRITIN 15  TIBC Not calculated due to Iron <10.  IRON <10*  RETICCTPCT 1.4   Urine Drug Screen: Drugs of Abuse     Component Value Date/Time   LABOPIA NONE DETECTED 04/22/2014 0037   LABOPIA NEGATIVE 02/21/2010 2201   COCAINSCRNUR POSITIVE* 04/22/2014 0037   COCAINSCRNUR NEGATIVE 02/21/2010 2201   LABBENZ NONE DETECTED 04/22/2014 0037   LABBENZ NEGATIVE 02/21/2010 2201  AMPHETMU NONE DETECTED 04/22/2014 0037   AMPHETMU NEGATIVE 02/21/2010 2201   THCU NONE DETECTED 04/22/2014 0037   LABBARB NONE DETECTED 04/22/2014 0037    Alcohol Level:  Recent Labs Lab 04/22/14 0118  ETH <5   Urinalysis:  Recent Labs Lab 04/22/14 0037 04/24/14 0634  COLORURINE YELLOW YELLOW  LABSPEC 1.041* 1.019  PHURINE 5.5 5.5  GLUCOSEU >1000* >1000*  HGBUR NEGATIVE NEGATIVE  BILIRUBINUR NEGATIVE NEGATIVE  KETONESUR NEGATIVE NEGATIVE  PROTEINUR NEGATIVE NEGATIVE  UROBILINOGEN 0.2 0.2  NITRITE NEGATIVE NEGATIVE  LEUKOCYTESUR NEGATIVE NEGATIVE    Micro Results: Recent Results (from the past 240 hour(s))  Culture, blood (routine x 2)     Status: None (Preliminary result)   Collection Time: 04/22/14 12:02 AM  Result Value Ref Range Status   Specimen Description BLOOD LEFT ANTECUBITAL  Final   Special Requests BOTTLES DRAWN AEROBIC AND ANAEROBIC 10CC EA   Final   Culture   Final           BLOOD CULTURE RECEIVED NO GROWTH TO DATE CULTURE WILL BE HELD FOR 5 DAYS BEFORE ISSUING A FINAL NEGATIVE REPORT Performed at [facility]    Report Status PENDING  Incomplete  Culture, blood (routine x 2)     Status: None (Preliminary result)   Collection Time: 04/22/14 12:08 AM  Result Value Ref Range Status   Specimen Description BLOOD LEFT HAND  Final   Special Requests BOTTLES DRAWN AEROBIC AND ANAEROBIC 5CC EA  Final   Culture   Final           BLOOD CULTURE RECEIVED NO GROWTH TO DATE CULTURE WILL BE HELD FOR 5 DAYS BEFORE ISSUING A FINAL NEGATIVE REPORT Performed at [facility]    Report Status PENDING  Incomplete  MRSA PCR Screening     Status: None   Collection Time: 04/22/14  3:50 AM  Result Value Ref Range Status   MRSA by PCR NEGATIVE NEGATIVE Final    Comment:        The GeneXpert MRSA Assay (FDA approved for NASAL specimens only), is one component of a comprehensive MRSA colonization surveillance program. It is not intended to diagnose MRSA infection nor to guide or monitor treatment for MRSA infections.   Respiratory virus panel (routine influenza)     Status: None   Collection Time: 04/22/14  5:34 PM  Result Value Ref Range Status   Source - RVPAN NASAL SWAB  Corrected    Comment: CORRECTED ON 12/31 AT 1923: PREVIOUSLY REPORTED AS NASAL SWAB   Respiratory Syncytial Virus A NOT DETECTED  Final   Respiratory Syncytial Virus B NOT DETECTED  Final   Influenza A NOT DETECTED  Final   Influenza B NOT DETECTED  Final   Parainfluenza 1 NOT DETECTED  Final   Parainfluenza 2 NOT DETECTED  Final   Parainfluenza 3 NOT DETECTED  Final   Metapneumovirus NOT DETECTED  Final   Rhinovirus NOT DETECTED  Final   Adenovirus NOT DETECTED  Final   Influenza A H1 NOT DETECTED  Final   Influenza A H3 NOT DETECTED  Final    Comment: (NOTE)       Normal Reference Range for each Analyte: NOT DETECTED Testing performed using the  Luminex xTAG Respiratory Viral Panel test kit. The analytical performance characteristics of this assay have been determined by [facility].  The modifications have not been cleared or approved by the FDA. This assay has been validated pursuant to the [employer] and  is used for clinical purposes. Performed at [facility]    Studies/Results: Dg Chest 2 View  04/23/2014   CLINICAL DATA:  Cough, chest pain.  EXAM: CHEST  2 VIEW  COMPARISON:  04/21/2014  FINDINGS: Stable cardiac and mediastinal contours. Interval development of consolidative opacities within the peripheral right mid and lower lung. Interval worsening of opacities within the left upper lung. No definite pleural effusion or pneumothorax. Mid thoracic spine degenerative changes. Leads overlie the patient.  IMPRESSION: Interval development of consolidative opacities within the peripheral right mid and lower lung as well as increasing consolidative opacities within the left upper lung, concerning for multi focal pneumonia. Continued radiographic followup is recommended to ensure resolution and exclude the possibility of underlying mass.   Electronically Signed   By: Lovey Newcomer M.D.   On: 04/23/2014 08:02   Medications:  I have reviewed the patient's current medications. Prior to Admission:  Prescriptions prior to admission  Medication Sig Dispense Refill Last Dose  . albuterol (PROVENTIL HFA;VENTOLIN HFA) 108 (90 BASE) MCG/ACT inhaler Inhale 2 puffs into the lungs every 4 (four) hours as needed for wheezing or shortness of breath.   Past Month at Unknown time  . gabapentin (NEURONTIN) 300 MG capsule Take 300 mg by mouth 3 (three) times daily.    Past Week at Unknown time  . insulin aspart (NOVOLOG) 100 UNIT/ML injection Sliding scale- If sugar 150-199: inject 2 unit;  200-249: 4 units, 250-299: 7 units; 300-349: 10 units; Over 350: 12 units (Patient taking differently: Inject 2-12 Units into the skin 3 (three)  times daily with meals. Sliding scale- If sugar 150-199: inject 2 unit;  200-249: 4 units, 250-299: 7 units; 300-349: 10 units; Over 350: 12 units) 10 mL 2 Past Week at Unknown time  . Insulin Glargine (LANTUS SOLOSTAR) 100 UNIT/ML Solostar Pen Inject 50 Units into the skin daily at 10 pm. 15 mL 2 Past Week at Unknown time  . labetalol (NORMODYNE) 100 MG tablet Take 1 tablet (100 mg total) by mouth 2 (two) times daily. 180 tablet 3 Past Week at pt does not rememeber  . LORazepam (ATIVAN) 1 MG tablet Take 1 mg by mouth every 8 (eight) hours as needed for anxiety.   Past Week at Unknown time  . naproxen sodium (ANAPROX) 220 MG tablet Take 440 mg by mouth 2 (two) times daily as needed (pain).   Past Week at Unknown time  . omeprazole (PRILOSEC) 20 MG capsule Take 1 capsule (20 mg total) by mouth daily. 30 capsule 0 Past Week at Unknown time  . promethazine (PHENERGAN) 25 MG suppository Place 1 suppository (25 mg total) rectally every 6 (six) hours as needed for nausea or vomiting. 12 each 0 Past Week at Unknown time  . senna-docusate (SENOKOT-S) 8.6-50 MG per tablet Take 1 tablet by mouth daily.   Past Week at Unknown time  . atorvastatin (LIPITOR) 80 MG tablet Take 1 tablet (80 mg total) by mouth daily at 6 PM. (Patient not taking: Reported on 03/11/2014) 30 tablet 2 Past Week at Unknown time  . dicyclomine (BENTYL) 20 MG tablet Take 1 tablet (20 mg total) by mouth 2 (two) times daily. (Patient not taking: Reported on 04/03/2014) 20 tablet 0 Past Week at Unknown time   Scheduled Meds: . atorvastatin  40 mg Oral q1800  . ceFEPime (MAXIPIME) IV  1 g Intravenous Q8H  . enoxaparin (LOVENOX) injection  40 mg Subcutaneous Daily  . ferrous sulfate  325 mg Oral TID  WC  . gabapentin  300 mg Oral TID  . insulin aspart  0-20 Units Subcutaneous TID WC  . insulin aspart  5 Units Subcutaneous TID WC  . insulin glargine  50 Units Subcutaneous QHS  . labetalol  100 mg Oral BID  . metoCLOPramide (REGLAN) injection   5 mg Intravenous 4 times per day  . metroNIDAZOLE  500 mg Oral Q12H  . pantoprazole (PROTONIX) IV  40 mg Intravenous Q24H  . [START ON 04/25/2014] senna-docusate  1 tablet Oral QHS   Continuous Infusions: . sodium chloride 125 mL/hr at 04/24/14 1059   PRN Meds:.acetaminophen **OR** acetaminophen, albuterol, promethazine Assessment/Plan: Principal Problem:   HCAP (healthcare-associated pneumonia) Active Problems:   Poorly controlled type 2 diabetes mellitus with peripheral neuropathy   Hyperlipidemia   OBESITY, MORBID   ANXIETY DEPRESSION   Polysubstance abuse   Essential hypertension   Chronic ischemic heart disease   Asthma   Iron deficiency anemia   NAUSEA AND VOMITING   Chest pain   Hypotension   History of Helicobacter pylori infection  HCAP: Repeat CXR showed interval development of consolidative opacities within the peripheral right mid and lower lung as well as increasing consolidative opacities within the left upper lung, concerning for multi focal pneumonia. Continued radiographic followup is recommended to ensure resolution and exclude the possibility of underlying mass. Legionella negative, UA negative, RSV negative. Blood cultures are NGTD. Patient is asymptomatic. We will discontinue vancomycin and continue cefepime with transition to oral antibiotics tomorrow.  -Cefepime (Day #3) -Discontinue vancomycin -Blood cultures pending -Hold labetalol in setting of hypotension  Gastroparesis with N/V/LLQ pain: Patient continues to have minimal nausea and 10/10 LLQ sharp, twisting pain. No vomiting or diarrhea. Patient denies frequent use of marijuana. Last use was 2 months ago. Patient denies having relief of pain with BM or increased pain in times of stress or anxiety. Patient stays constipated and has not had a BM since admission. EGD showed evidence of gastroparesis and GI recommended Gastric Emptying Study, but patient refused. She is tolerating a regular diet. TTG IgA  negative <1, but 3 years ago 7.2. IgA still pending. Patient is sinus tachycardic which could be secondary to pain, volume depletion or being off her labetalol. I will reorder labetalol, order orthostatics and give IV fluids. GI saw our patient and feels symptoms are highly suspicious given EGD results for diabetic gastroparesis. They recommended Reglan 5 mg IV Q6H and then advance to 10 mg if tolerated. For gastroparesis, will use caution with medications given QTc 435.  -Continue PPI -H. Pylori antigen pending -FOBT pending -Transglutaminase IgA level pending -IgA in process  -Heart Healthy Diet -Gastric biopsies pending -Senokot-S 2 tabs once now -Senokot 1 tab QHS starting tomorrow -Labetalol 100 mg BID -Orthostatics -NS IV 125 mL/hr for 8 hours -Gastroenterology following, appreciate recommendations -Reglan 5 mg IV Q6H  -Repeat EKG tomorrow  -Tramadol 50 mg Q6H prn  Iron Deficiency Anemia: Baseline around 9-10. Admission Hgb 9.6>7.7>8.4. Iron level low <10, TIBC not calculated, ferritin low-normal at 15, folate and vitamin B12 normal.  -FOBT pending -Ferrous Sulfate 325 mg TID WC, monitor for increased constipation -Fereheme 1,020 mg once -Senokot-S 1 tab QHS (starting tomorrow) -Senokot-S 2 tabs once  Uncontrolled DM2 with peripheral neuropathy: Last A1C 10.3 on 02/18/14.CBGs 100-300.  -Continue home Lantus 50 units -CBG AC/HS -SSI resistant -Novolog 5 units TID WC -Continue home gabapentin  Bacterial Vaginosis: Patient was seen in the clinic on 02/19/2014 with complaint of itching, burning and discharge.  Patient was found to + for Gardnerella, but negative for Trichomonas, Chlamydia, HIV or for UTI. She was prescribed Clindamycin Vaginal suppositories 300 mg QHS for 3 nights, but patient never picked these medications up. She states she is continuing to have the same symptoms and would like treatment. Her UA was negative for infection. -Metronidazole 500 mg BID (start date  04/24/14; stop date 05/01/14)  DVT/PE ppx: Lovenox SQ daily  Dispo: Disposition is deferred at this time, awaiting improvement of current medical problems.  Anticipated discharge in approximately 1-2 day(s).   The patient does have a current PCP Luan Moore, MD) and does need an [facility] hospital follow-up appointment after discharge.  The patient does not have transportation limitations that hinder transportation to clinic appointments.  .Services Needed at time of discharge: Y = Yes, Blank = No PT:   OT:   RN:   Equipment:   Other:     LOS: 3 days   Osa Craver, DO PGY-1 Internal Medicine Resident Pager # [phone] 04/24/2014 12:36 PM

## 2014-04-25 DIAGNOSIS — E1143 Type 2 diabetes mellitus with diabetic autonomic (poly)neuropathy: Secondary | ICD-10-CM

## 2014-04-25 DIAGNOSIS — I951 Orthostatic hypotension: Secondary | ICD-10-CM

## 2014-04-25 DIAGNOSIS — R1032 Left lower quadrant pain: Secondary | ICD-10-CM

## 2014-04-25 LAB — GLUCOSE, CAPILLARY
GLUCOSE-CAPILLARY: 188 mg/dL — AB (ref 70–99)
GLUCOSE-CAPILLARY: 199 mg/dL — AB (ref 70–99)
Glucose-Capillary: 205 mg/dL — ABNORMAL HIGH (ref 70–99)
Glucose-Capillary: 214 mg/dL — ABNORMAL HIGH (ref 70–99)

## 2014-04-25 LAB — CBC
HEMATOCRIT: 27.9 % — AB (ref 36.0–46.0)
Hemoglobin: 8.6 g/dL — ABNORMAL LOW (ref 12.0–15.0)
MCH: 19 pg — AB (ref 26.0–34.0)
MCHC: 30.8 g/dL (ref 30.0–36.0)
MCV: 61.6 fL — ABNORMAL LOW (ref 78.0–100.0)
Platelets: 655 10*3/uL — ABNORMAL HIGH (ref 150–400)
RBC: 4.53 MIL/uL (ref 3.87–5.11)
RDW: 19.4 % — AB (ref 11.5–15.5)
WBC: 17.6 10*3/uL — ABNORMAL HIGH (ref 4.0–10.5)

## 2014-04-25 MED ORDER — PANTOPRAZOLE SODIUM 40 MG IV SOLR
40.0000 mg | INTRAVENOUS | Status: DC
  Administered 2014-04-26: 40 mg via INTRAVENOUS
  Filled 2014-04-25: qty 40

## 2014-04-25 MED ORDER — OXYCODONE-ACETAMINOPHEN 5-325 MG PO TABS: 1.0000 | ORAL_TABLET | Freq: Four times a day (QID) | ORAL | Status: DC | PRN

## 2014-04-25 MED ORDER — OXYCODONE-ACETAMINOPHEN 5-325 MG PO TABS
1.0000 | ORAL_TABLET | Freq: Four times a day (QID) | ORAL | Status: DC | PRN
  Administered 2014-04-25: 1 via ORAL
  Administered 2014-04-25 – 2014-04-26 (×2): 2 via ORAL
  Administered 2014-04-26: 1 via ORAL
  Filled 2014-04-25: qty 2
  Filled 2014-04-25 (×2): qty 1
  Filled 2014-04-25: qty 2

## 2014-04-25 MED ORDER — SORBITOL 70 % SOLN
30.0000 mL | Freq: Once | Status: AC
  Administered 2014-04-25: 30 mL via ORAL
  Filled 2014-04-25: qty 30

## 2014-04-25 MED ORDER — POLYETHYLENE GLYCOL 3350 17 G PO PACK
17.0000 g | PACK | Freq: Every day | ORAL | Status: DC
  Filled 2014-04-25: qty 1

## 2014-04-25 MED ORDER — LEVOFLOXACIN 750 MG PO TABS
750.0000 mg | ORAL_TABLET | Freq: Every day | ORAL | Status: DC
  Administered 2014-04-25 – 2014-04-26 (×2): 750 mg via ORAL
  Filled 2014-04-25 (×2): qty 1

## 2014-04-25 MED ORDER — SODIUM CHLORIDE 0.9 % IV BOLUS (SEPSIS)
1000.0000 mL | Freq: Once | INTRAVENOUS | Status: AC
  Administered 2014-04-25: 1000 mL via INTRAVENOUS

## 2014-04-25 MED ORDER — METOCLOPRAMIDE HCL 5 MG/ML IJ SOLN
10.0000 mg | Freq: Four times a day (QID) | INTRAMUSCULAR | Status: DC
  Administered 2014-04-25 – 2014-04-26 (×2): 10 mg via INTRAVENOUS
  Filled 2014-04-25 (×2): qty 2

## 2014-04-25 MED ORDER — INSULIN GLARGINE 100 UNIT/ML ~~LOC~~ SOLN
52.0000 [IU] | Freq: Every day | SUBCUTANEOUS | Status: DC
Start: 2014-04-25 — End: 2014-04-26
  Administered 2014-04-25: 52 [IU] via SUBCUTANEOUS
  Filled 2014-04-25 (×3): qty 0.52

## 2014-04-25 MED ORDER — MAGNESIUM CITRATE PO SOLN
1.0000 | Freq: Once | ORAL | Status: DC
  Filled 2014-04-25: qty 296

## 2014-04-25 MED ORDER — INSULIN ASPART 100 UNIT/ML ~~LOC~~ SOLN
7.0000 [IU] | Freq: Three times a day (TID) | SUBCUTANEOUS | Status: DC
  Administered 2014-04-25 – 2014-04-26 (×5): 7 [IU] via SUBCUTANEOUS

## 2014-04-25 MED ORDER — SODIUM CHLORIDE 0.9 % IV SOLN
INTRAVENOUS | Status: AC
  Administered 2014-04-25: 500 mL via INTRAVENOUS

## 2014-04-25 NOTE — Progress Notes (Signed)
[facility] Progress Note  Subjective: Patient complaining of severe left lower quadrant pain especially the last 2 days but apparently this is been a recurrent previous complaint as well. The pain requires her to ask for more pain medicine which makes her more nauseated. Seems to be tolerating Reglan at 5 mg dose  Objective: Vital signs in last 24 hours: Temp:  [98.5 F (36.9 C)-99.5 F (37.5 C)] 98.5 F (36.9 C) (01/02 0700) Pulse Rate:  [95-126] 95 (01/02 1009) Resp:  [18-20] 20 (01/02 0700) BP: (120-144)/(77-87) 120/80 mmHg (01/02 1009) SpO2:  [100 %] 100 % (01/02 0700) Weight:  [97.841 kg (215 lb 11.2 oz)] 97.841 kg (215 lb 11.2 oz) (01/02 0700) Weight change: 9.389 kg (20 lb 11.2 oz)   PE: Tender left lower quadrant without mass, positive guarding  Lab Results: Results for orders placed or performed during the hospital encounter of 04/21/14 (from the past 24 hour(s))  Glucose, capillary     Status: Abnormal   Collection Time: 04/24/14  4:21 PM  Result Value Ref Range   Glucose-Capillary 420 (H) 70 - 99 mg/dL   Comment 1 Documented in Chart    Comment 2 Notify RN   Glucose, capillary     Status: Abnormal   Collection Time: 04/24/14  6:09 PM  Result Value Ref Range   Glucose-Capillary 343 (H) 70 - 99 mg/dL   Comment 1 Documented in Chart    Comment 2 Notify RN   Glucose, capillary     Status: Abnormal   Collection Time: 04/24/14  9:01 PM  Result Value Ref Range   Glucose-Capillary 293 (H) 70 - 99 mg/dL  Glucose, capillary     Status: Abnormal   Collection Time: 04/25/14  7:55 AM  Result Value Ref Range   Glucose-Capillary 205 (H) 70 - 99 mg/dL  CBC     Status: Abnormal   Collection Time: 04/25/14  8:58 AM  Result Value Ref Range   WBC 17.6 (H) 4.0 - 10.5 K/uL   RBC 4.53 3.87 - 5.11 MIL/uL   Hemoglobin 8.6 (L) 12.0 - 15.0 g/dL   HCT 27.9 (L) 36.0 - 46.0 %   MCV 61.6 (L) 78.0 - 100.0 fL   MCH 19.0 (L) 26.0 - 34.0 pg   MCHC 30.8 30.0 - 36.0 g/dL   RDW  19.4 (H) 11.5 - 15.5 %   Platelets 655 (H) 150 - 400 K/uL  Glucose, capillary     Status: Abnormal   Collection Time: 04/25/14 11:49 AM  Result Value Ref Range   Glucose-Capillary 188 (H) 70 - 99 mg/dL    Studies/Results: No results found.    Assessment: Nausea and vomiting with strong evidence by EGD of diabetic gastroparesis Left lower quadrant pain worse in the last 2 days but apparently recurrent, an abdominal CT scan in September for similar complaints was unrevealing  Plan: Will increase Reglan to 10 mg IV every 6 hours Will leave to hospitalist discretion whether any imaging of her lower abdomen or pelvis warranted.     GSUPJ,SRPR C 04/25/2014, 2:09 PM

## 2014-04-25 NOTE — Progress Notes (Signed)
ANTIBIOTIC CONSULT NOTE - INITIAL  Pharmacy Consult for Levaquin Indication: HCAP  Allergies  Allergen Reactions  . Lisinopril Nausea Only  . Morphine And Related Itching    Patient Measurements: Height: 5' 7"  (170.2 cm) Weight: 215 lb 11.2 oz (97.841 kg) IBW/kg (Calculated) : 61.6  Vital Signs: Temp: 98.5 F (36.9 C) (01/02 0700) Temp Source: Oral (01/02 0700) BP: 123/77 mmHg (01/02 0700) Pulse Rate: 105 (01/02 0700) Intake/Output from previous day: 01/01 0701 - 01/02 0700 In: 490 [P.O.:240; IV Piggyback:250] Out: -  Intake/Output from this shift: Total I/O In: 120 [P.O.:120] Out: -   Labs:  Recent Labs  04/23/14 0425 04/24/14 0506  WBC 9.5 11.8*  HGB 7.7* 8.4*  PLT 460* 496*  CREATININE 1.00 0.99   Estimated Creatinine Clearance: 90.7 mL/min (by C-G formula based on Cr of 0.99). No results for input(s): VANCOTROUGH, VANCOPEAK, VANCORANDOM, GENTTROUGH, GENTPEAK, GENTRANDOM, TOBRATROUGH, TOBRAPEAK, TOBRARND, AMIKACINPEAK, AMIKACINTROU, AMIKACIN in the last 72 hours.   Microbiology: Recent Results (from the past 720 hour(s))  Culture, blood (routine x 2)     Status: None (Preliminary result)   Collection Time: 04/22/14 12:02 AM  Result Value Ref Range Status   Specimen Description BLOOD LEFT ANTECUBITAL  Final   Special Requests BOTTLES DRAWN AEROBIC AND ANAEROBIC 10CC EA  Final   Culture   Final           BLOOD CULTURE RECEIVED NO GROWTH TO DATE CULTURE WILL BE HELD FOR 5 DAYS BEFORE ISSUING A FINAL NEGATIVE REPORT Performed at [facility]    Report Status PENDING  Incomplete  Culture, blood (routine x 2)     Status: None (Preliminary result)   Collection Time: 04/22/14 12:08 AM  Result Value Ref Range Status   Specimen Description BLOOD LEFT HAND  Final   Special Requests BOTTLES DRAWN AEROBIC AND ANAEROBIC 5CC EA  Final   Culture   Final           BLOOD CULTURE RECEIVED NO GROWTH TO DATE CULTURE WILL BE HELD FOR 5 DAYS BEFORE ISSUING A FINAL  NEGATIVE REPORT Performed at [facility]    Report Status PENDING  Incomplete  MRSA PCR Screening     Status: None   Collection Time: 04/22/14  3:50 AM  Result Value Ref Range Status   MRSA by PCR NEGATIVE NEGATIVE Final    Comment:        The GeneXpert MRSA Assay (FDA approved for NASAL specimens only), is one component of a comprehensive MRSA colonization surveillance program. It is not intended to diagnose MRSA infection nor to guide or monitor treatment for MRSA infections.   Respiratory virus panel (routine influenza)     Status: None   Collection Time: 04/22/14  5:34 PM  Result Value Ref Range Status   Source - RVPAN NASAL SWAB  Corrected    Comment: CORRECTED ON 12/31 AT 1923: PREVIOUSLY REPORTED AS NASAL SWAB   Respiratory Syncytial Virus A NOT DETECTED  Final   Respiratory Syncytial Virus B NOT DETECTED  Final   Influenza A NOT DETECTED  Final   Influenza B NOT DETECTED  Final   Parainfluenza 1 NOT DETECTED  Final   Parainfluenza 2 NOT DETECTED  Final   Parainfluenza 3 NOT DETECTED  Final   Metapneumovirus NOT DETECTED  Final   Rhinovirus NOT DETECTED  Final   Adenovirus NOT DETECTED  Final   Influenza A H1 NOT DETECTED  Final   Influenza A H3 NOT DETECTED  Final    Comment: (NOTE)       Normal Reference Range for each Analyte: NOT DETECTED Testing performed using the Luminex xTAG Respiratory Viral Panel test kit. The analytical performance characteristics of this assay have been determined by [facility].  The modifications have not been cleared or approved by the FDA. This assay has been validated pursuant to the [employer] regulations and is used for clinical purposes. Performed at [facility] History: Past Medical History  Diagnosis Date  . Thrombocytosis     Hem/Onc suggested 2/2 chronic hepatits and/or iron deficiency anemia  . Iron deficiency anemia   . N&V (nausea and vomiting)     Chronic. Unclear etiology with  multiple admission and ED visits. CT abdomen with and without contrast (02/2011)  showed no acute process. Gastic Emptying scan (01/2010) was normal. Ultrasound of the abdomen was within normal limits. Hepatitis B viral load was undectable. HIV NR. EGD - gastritis, Hpylori + s/p Rx  . Hypertension   . Hyperlipidemia   . Diabetes mellitus type 2, uncontrolled, with complications   . Recurrent boils   . 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 Cath 12/10- no restenosis or significant CAD progression  . Irregular menses     Small ovarian follicles seen on PJ(8/25)  . History of pyelonephritis     H/o GrpB Pyelonephritis (9/06) and UTI- 07/11- E.Coli, 12/10- GBS  . Abscess of tunica vaginalis     10/09- Abundant S. aureus- sensitive to all abx  . Obesity   . Gastritis   . Depression   . Peripheral neuropathy   . Fibromyalgia   . GERD (gastroesophageal reflux disease)   . Anxiety   . Gastroparesis     secondary to poorly controlled DM, last emptying study performed 01/2010  was normal but may be falsely positive as pt was on reglan  . Blood dyscrasia   . Substance abuse   . MI (myocardial infarction) 2008  . OSA (obstructive sleep apnea)     "suppose to wear mask but I don't" (04/22/2014)  . Seasonal asthma   . Hepatitis B, chronic     Hep BeAb+,Hep B cAb+ & Hep BsAg+ (9/06)  . Migraine     "weekly" (04/22/2014)  . CVA (cerebral vascular accident) ~ 02/2014    denies residual on 04/22/2014  . CVA (cerebral vascular accident)     history of remote right cerebellar infarct noted on head CT at least since 10/2011  . Pneumonia     "this is probably the 2nd or 3rd time I've had pneumonia" (04/22/2014)    Medications:  Scheduled:  . atorvastatin  40 mg Oral q1800  . enoxaparin (LOVENOX) injection  40 mg Subcutaneous Daily  . ferrous sulfate  325 mg Oral TID WC  .  gabapentin  300 mg Oral TID  . insulin aspart  0-20 Units Subcutaneous TID WC  . insulin aspart  7 Units Subcutaneous TID WC  . insulin glargine  52 Units Subcutaneous QHS  . labetalol  100 mg Oral BID  . levofloxacin  750 mg Oral Daily  . metoCLOPramide (REGLAN) injection  5 mg Intravenous 4 times per day  . metroNIDAZOLE  500 mg Oral Q12H  . pantoprazole (PROTONIX) IV  40 mg Intravenous Q24H  . polyethylene glycol  17 g Oral Daily  . senna-docusate  1 tablet Oral QHS   Assessment: 15 yoF admitted on 12/30 with chest pain, N/V, and emesis for 4-days prior-to-arrival.  On admission, she received treatment for rule-out HCAP with Vancomycin and Cefepime.  Vancomycin was discontinued on 1/1.  Pharmacy is being consulted today to transition the patient from Cefepime to oral Levaquin for rule-out HCAP. Today is day number 4 of therapy.  Patient's renal function remains stable (CrCl~90), she is afebrile, and has a very small WBC of 11.8.  Blood cultures are negative.  Of note, GI is seeing this patient for her reported N/V; however, she has been tolerating oral medications during this admission.  Goal of Therapy:  Clinical improvement and resolution of infection  Plan:  - Start Levaquin 750 mg PO q24h - Monitor renal function and clinical progress  Theron Arista, PharmD Clinical Pharmacist - Resident Pager: [phone] 1/2/20169:06 AM

## 2014-04-25 NOTE — Progress Notes (Signed)
Subjective:  Patient was seen and examined this morning. Patient's main complaint is her uncontrolled LLQ pain. Nausea increases when the pain increases. She cannot associate the pain with anything. Nothing make it better except for pain medication. The toradol and tramadol don't work. The dilaudid last night worked very well. Currently her pain is a 9/10. Patient states she couldn't eat dinner last night due to pain and nausea but denies any vomiting. She ate all of her breakfast this morning. She had a small bowel movement yesterday and continues to have flatus. Passing flatus and having a BM did not improve her pain. She denies any fever, chill, shortness of breath or cough.   Objective: Filed Vitals:   04/24/14 1437 04/24/14 2002 04/25/14 0700 04/25/14 1009  BP:  144/87 123/77 120/80  Pulse:  126 105 95  Temp: 99 F (37.2 C) 99.5 F (37.5 C) 98.5 F (36.9 C)   TempSrc: Oral Oral Oral   Resp: 18 20 20    Height:      Weight:   97.841 kg (215 lb 11.2 oz)   SpO2: 100% 100% 100%    General: Vital signs reviewed.  Patient is well-developed and well-nourished, in no acute distress and cooperative with exam.  Cardiovascular: Tachycardic, regular rhythm, S1 normal, S2 normal, no murmurs, gallops, or rubs. Pulmonary/Chest: Clear to auscultation bilaterally, no wheezes, rales, or rhonchi. Abdominal: Soft, tender to palpation in LLQ, non-distended, active BS, no masses, organomegaly, or guarding present.  Extremities: No lower extremity edema bilaterally Neurological: A&O x3 Psychiatric: Normal mood and affect. speech and behavior is normal. Cognition and memory are normal.   Lab Results: Basic Metabolic Panel:  Recent Labs Lab 04/22/14 0118  04/23/14 0425 04/24/14 0506  NA  --   < > 136 136  K  --   < > 4.1 4.1  CL  --   < > 109 108  CO2  --   < > 22 20  GLUCOSE  --   < > 334* 398*  BUN  --   < > 9 9  CREATININE  --   < > 1.00 0.99  CALCIUM  --   < > 8.3* 8.5  MG 1.9  --   --    --   < > = values in this interval not displayed. Liver Function Tests:  Recent Labs Lab 04/21/14 1820  AST 16  ALT 7  ALKPHOS 60  BILITOT 0.4  PROT 7.7  ALBUMIN 2.9*    Recent Labs Lab 04/21/14 1820  LIPASE 36   CBC:  Recent Labs Lab 04/21/14 1820 04/22/14 0333  04/24/14 0506 04/25/14 0858  WBC 14.9* 11.1*  < > 11.8* 17.6*  NEUTROABS 10.4* 7.2  --   --   --   HGB 9.6* 8.2*  < > 8.4* 8.6*  HCT 30.2* 26.8*  < > 27.2* 27.9*  MCV 60.9* 61.5*  < > 61.8* 61.6*  PLT 512* 471*  < > 496* 655*  < > = values in this interval not displayed. Cardiac Enzymes:  Recent Labs Lab 04/22/14 0118 04/22/14 0333  TROPONINI <0.03 <0.03   CBG:  Recent Labs Lab 04/24/14 0735 04/24/14 1114 04/24/14 1621 04/24/14 1809 04/24/14 2101 04/25/14 0755  GLUCAP 344* 181* 420* 343* 293* 205*   Anemia Panel:  Recent Labs Lab 04/22/14 0118  VITAMINB12 490  FOLATE 18.1  FERRITIN 15  TIBC Not calculated due to Iron <10.  IRON <10*  RETICCTPCT 1.4   Urine Drug Screen:  Drugs of Abuse     Component Value Date/Time   LABOPIA NONE DETECTED 04/22/2014 0037   LABOPIA NEGATIVE 02/21/2010 2201   COCAINSCRNUR POSITIVE* 04/22/2014 0037   COCAINSCRNUR NEGATIVE 02/21/2010 2201   LABBENZ NONE DETECTED 04/22/2014 0037   LABBENZ NEGATIVE 02/21/2010 2201   AMPHETMU NONE DETECTED 04/22/2014 0037   AMPHETMU NEGATIVE 02/21/2010 2201   THCU NONE DETECTED 04/22/2014 0037   LABBARB NONE DETECTED 04/22/2014 0037    Alcohol Level:  Recent Labs Lab 04/22/14 0118  ETH <5   Urinalysis:  Recent Labs Lab 04/22/14 0037 04/24/14 0634  COLORURINE YELLOW YELLOW  LABSPEC 1.041* 1.019  PHURINE 5.5 5.5  GLUCOSEU >1000* >1000*  HGBUR NEGATIVE NEGATIVE  BILIRUBINUR NEGATIVE NEGATIVE  KETONESUR NEGATIVE NEGATIVE  PROTEINUR NEGATIVE NEGATIVE  UROBILINOGEN 0.2 0.2  NITRITE NEGATIVE NEGATIVE  LEUKOCYTESUR NEGATIVE NEGATIVE    Micro Results: Recent Results (from the past 240 hour(s))    Culture, blood (routine x 2)     Status: None (Preliminary result)   Collection Time: 04/22/14 12:02 AM  Result Value Ref Range Status   Specimen Description BLOOD LEFT ANTECUBITAL  Final   Special Requests BOTTLES DRAWN AEROBIC AND ANAEROBIC 10CC EA  Final   Culture   Final           BLOOD CULTURE RECEIVED NO GROWTH TO DATE CULTURE WILL BE HELD FOR 5 DAYS BEFORE ISSUING A FINAL NEGATIVE REPORT Performed at [facility]    Report Status PENDING  Incomplete  Culture, blood (routine x 2)     Status: None (Preliminary result)   Collection Time: 04/22/14 12:08 AM  Result Value Ref Range Status   Specimen Description BLOOD LEFT HAND  Final   Special Requests BOTTLES DRAWN AEROBIC AND ANAEROBIC 5CC EA  Final   Culture   Final           BLOOD CULTURE RECEIVED NO GROWTH TO DATE CULTURE WILL BE HELD FOR 5 DAYS BEFORE ISSUING A FINAL NEGATIVE REPORT Performed at [facility]    Report Status PENDING  Incomplete  MRSA PCR Screening     Status: None   Collection Time: 04/22/14  3:50 AM  Result Value Ref Range Status   MRSA by PCR NEGATIVE NEGATIVE Final    Comment:        The GeneXpert MRSA Assay (FDA approved for NASAL specimens only), is one component of a comprehensive MRSA colonization surveillance program. It is not intended to diagnose MRSA infection nor to guide or monitor treatment for MRSA infections.   Respiratory virus panel (routine influenza)     Status: None   Collection Time: 04/22/14  5:34 PM  Result Value Ref Range Status   Source - RVPAN NASAL SWAB  Corrected    Comment: CORRECTED ON 12/31 AT 1923: PREVIOUSLY REPORTED AS NASAL SWAB   Respiratory Syncytial Virus A NOT DETECTED  Final   Respiratory Syncytial Virus B NOT DETECTED  Final   Influenza A NOT DETECTED  Final   Influenza B NOT DETECTED  Final   Parainfluenza 1 NOT DETECTED  Final   Parainfluenza 2 NOT DETECTED  Final   Parainfluenza 3 NOT DETECTED  Final   Metapneumovirus NOT DETECTED   Final   Rhinovirus NOT DETECTED  Final   Adenovirus NOT DETECTED  Final   Influenza A H1 NOT DETECTED  Final   Influenza A H3 NOT DETECTED  Final    Comment: (NOTE)       Normal Reference Range for each  Analyte: NOT DETECTED Testing performed using the Luminex xTAG Respiratory Viral Panel test kit. The analytical performance characteristics of this assay have been determined by [facility].  The modifications have not been cleared or approved by the FDA. This assay has been validated pursuant to the CLIA regulations and is used for clinical purposes. Performed at [facility]    Studies/Results: No results found. Medications:  I have reviewed the patient's current medications. Prior to Admission:  Prescriptions prior to admission  Medication Sig Dispense Refill Last Dose  . albuterol (PROVENTIL HFA;VENTOLIN HFA) 108 (90 BASE) MCG/ACT inhaler Inhale 2 puffs into the lungs every 4 (four) hours as needed for wheezing or shortness of breath.   Past Month at Unknown time  . gabapentin (NEURONTIN) 300 MG capsule Take 300 mg by mouth 3 (three) times daily.    Past Week at Unknown time  . insulin aspart (NOVOLOG) 100 UNIT/ML injection Sliding scale- If sugar 150-199: inject 2 unit;  200-249: 4 units, 250-299: 7 units; 300-349: 10 units; Over 350: 12 units (Patient taking differently: Inject 2-12 Units into the skin 3 (three) times daily with meals. Sliding scale- If sugar 150-199: inject 2 unit;  200-249: 4 units, 250-299: 7 units; 300-349: 10 units; Over 350: 12 units) 10 mL 2 Past Week at Unknown time  . Insulin Glargine (LANTUS SOLOSTAR) 100 UNIT/ML Solostar Pen Inject 50 Units into the skin daily at 10 pm. 15 mL 2 Past Week at Unknown time  . labetalol (NORMODYNE) 100 MG tablet Take 1 tablet (100 mg total) by mouth 2 (two) times daily. 180 tablet 3 Past Week at pt does not rememeber  . LORazepam (ATIVAN) 1 MG tablet Take 1 mg by mouth every 8 (eight) hours as needed for  anxiety.   Past Week at Unknown time  . naproxen sodium (ANAPROX) 220 MG tablet Take 440 mg by mouth 2 (two) times daily as needed (pain).   Past Week at Unknown time  . omeprazole (PRILOSEC) 20 MG capsule Take 1 capsule (20 mg total) by mouth daily. 30 capsule 0 Past Week at Unknown time  . promethazine (PHENERGAN) 25 MG suppository Place 1 suppository (25 mg total) rectally every 6 (six) hours as needed for nausea or vomiting. 12 each 0 Past Week at Unknown time  . senna-docusate (SENOKOT-S) 8.6-50 MG per tablet Take 1 tablet by mouth daily.   Past Week at Unknown time  . atorvastatin (LIPITOR) 80 MG tablet Take 1 tablet (80 mg total) by mouth daily at 6 PM. (Patient not taking: Reported on 03/11/2014) 30 tablet 2 Past Week at Unknown time  . dicyclomine (BENTYL) 20 MG tablet Take 1 tablet (20 mg total) by mouth 2 (two) times daily. (Patient not taking: Reported on 04/03/2014) 20 tablet 0 Past Week at Unknown time   Scheduled Meds: . atorvastatin  40 mg Oral q1800  . enoxaparin (LOVENOX) injection  40 mg Subcutaneous Daily  . ferrous sulfate  325 mg Oral TID WC  . gabapentin  300 mg Oral TID  . insulin aspart  0-20 Units Subcutaneous TID WC  . insulin aspart  7 Units Subcutaneous TID WC  . insulin glargine  52 Units Subcutaneous QHS  . labetalol  100 mg Oral BID  . levofloxacin  750 mg Oral Daily  . magnesium citrate  1 Bottle Oral Once  . metoCLOPramide (REGLAN) injection  5 mg Intravenous 4 times per day  . metroNIDAZOLE  500 mg Oral Q12H  . pantoprazole (PROTONIX)  IV  40 mg Intravenous Q24H  . senna-docusate  1 tablet Oral QHS  . sodium chloride  1,000 mL Intravenous Once   Continuous Infusions: . sodium chloride 500 mL (04/25/14 0810)   PRN Meds:.acetaminophen **OR** acetaminophen, albuterol, oxyCODONE-acetaminophen, promethazine Assessment/Plan: Principal Problem:   HCAP (healthcare-associated pneumonia) Active Problems:   Poorly controlled type 2 diabetes mellitus with  peripheral neuropathy   Hyperlipidemia   OBESITY, MORBID   ANXIETY DEPRESSION   Polysubstance abuse   Essential hypertension   Chronic ischemic heart disease   Asthma   Iron deficiency anemia   NAUSEA AND VOMITING   Chest pain   Hypotension   History of Helicobacter pylori infection  HCAP: Repeat CXR showed interval development of consolidative opacities within the peripheral right mid and lower lung as well as increasing consolidative opacities within the left upper lung, concerning for multi focal pneumonia. Continued radiographic followup is recommended to ensure resolution and exclude the possibility of underlying mass. Legionella negative, UA negative, RSV negative. Blood cultures are NGTD. Patient is asymptomatic. WBC was trending down, but increased from 9>11.8>17. Patient may be hemo-concentrated as platelets were up too.  -CBC tomorrow -Discontinue Cefepime -Start po antibiotic- Levaquin 750 mg daily -Blood cultures pending  Diabetic Gastroparesis and LLQ Pain: Patient was started on Reglan 5 mg IV Q6H per GI yesterday and then advance to 10 mg if tolerated. Patient had positive orthostatics yesterday after receiving some IV fluids. Pain is chronic in nature, but could be acutely worse secondary to constipation. Patient was given senokot 2 tabs yesterday. She was complaining of bloating and flatus without BM since admission. Patient did have a small bowel hard bowel movement yesterday. For LLQ pain, patient had tramadol 50 mg Q6H. Patient received one dose yesterday and then was given dilaudid 1 mg IV once and toradol 30 mg IV once overnight for pain. We will transition to percocet for better pain control. For constipation, we will try mag citrate today. This may improve her flatus and abdominal pain. Unfortunately, we may be worsen her constipation with narcotics and iron-both of which are necessary at this time to control her pain and improved her iron-deficiency anemia. Patient will  need to be a on a better bowel regimen at home. She will not be prescribed long term narcotics on discharge. Pain could also be secondary to large uterine fibroid. The rest of her work up has been negative thus far. It would be reasonable for patient to follow up with OB/GYN on discharge to consider hysterectomy. This would also improve iron-deficiency anemia from menorrhagia. EKG shows normal QT/QTc.  -Continue PPI -H. Pylori antigen pending -FOBT pending -IgA in process  -Heart Healthy Diet -Gastric biopsies pending -Senokot 1 tab QHS -Miralax 17 g once -NS IV 125 mL/hr for 8 hours -NS IL Bolus -Gastroenterology following, appreciate recommendations -Reglan 5 mg IV Q6H  -Percocet 5-325 1-2 tabs Q6H prn  Orthostatic Hypotension: Patient's orthostatics were positive yesterday. She does feel dizzy when she stands up too quickly. Patient had nausea and vomiting on admission, but has had no recurrent vomiting. She has been eating well, but did skip dinner last night due to nausea. She is likely dehydrated still as she continues to state her mouth and lips are dry and she is tachycardic (sinus) on repeat EKG. -NS I L bolus -NS IV 125 mL/hr for 8 hours  Iron Deficiency Anemia: Baseline around 9-10. Admission Hgb 9.6>7.7>8.4. Iron level low <10, TIBC not calculated, ferritin low-normal at 15, folate and  vitamin B12 normal. Patient was given Fereheme 1,020 mg once yesterday. -FOBT pending -Ferrous Sulfate 325 mg TID WC, monitor for increased constipation -Mag Citrate -Senokot-S 1 tab QHS  -CBC  Uncontrolled DM2 with peripheral neuropathy: Last A1C 10.3 on 02/18/14.CBGs have been uncontrolled. Yesterday, glucose ranged from 181 to 420. Patient is eating an excessive amount of food from outside of the hospital. Patient required an additional 7 units of Novolog after dinner in addition to her 12 units of sliding scale and 5 units with meals.  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 Patient never started treatment and continues to have symptoms. -Metronidazole 500 mg BID (start date 04/24/14; stop date 05/01/14)  DVT/PE ppx: Lovenox SQ daily  Dispo: Disposition is deferred at this time, awaiting improvement of current medical problems.  Anticipated discharge in approximately 1-2 day(s).   The patient does have a current PCP Luan Moore, MD) and does need an [facility] hospital follow-up appointment after discharge.  The patient does not have transportation limitations that hinder transportation to clinic appointments.  .Services Needed at time of discharge: Y = Yes, Blank = No PT:   OT:   RN:   Equipment:   Other:     LOS: 4 days   Osa Craver, DO PGY-1 Internal Medicine Resident Pager # [phone] 04/25/2014 10:27 AM

## 2014-04-26 LAB — BASIC METABOLIC PANEL
ANION GAP: 8 (ref 5–15)
BUN: 8 mg/dL (ref 6–23)
CALCIUM: 7.9 mg/dL — AB (ref 8.4–10.5)
CO2: 20 mmol/L (ref 19–32)
Chloride: 109 mEq/L (ref 96–112)
Creatinine, Ser: 0.84 mg/dL (ref 0.50–1.10)
GFR, EST NON AFRICAN AMERICAN: 86 mL/min — AB (ref >90)
Glucose, Bld: 213 mg/dL — ABNORMAL HIGH (ref 70–99)
POTASSIUM: 3.7 mmol/L (ref 3.5–5.1)
Sodium: 137 mmol/L (ref 135–145)

## 2014-04-26 LAB — CBC
HCT: 23.5 % — ABNORMAL LOW (ref 36.0–46.0)
HEMOGLOBIN: 7.3 g/dL — AB (ref 12.0–15.0)
MCH: 19.2 pg — ABNORMAL LOW (ref 26.0–34.0)
MCHC: 31.1 g/dL (ref 30.0–36.0)
MCV: 61.8 fL — AB (ref 78.0–100.0)
PLATELETS: 561 10*3/uL — AB (ref 150–400)
RBC: 3.8 MIL/uL — ABNORMAL LOW (ref 3.87–5.11)
RDW: 19.3 % — ABNORMAL HIGH (ref 11.5–15.5)
WBC: 16.4 10*3/uL — ABNORMAL HIGH (ref 4.0–10.5)

## 2014-04-26 LAB — GLUCOSE, CAPILLARY
GLUCOSE-CAPILLARY: 167 mg/dL — AB (ref 70–99)
Glucose-Capillary: 177 mg/dL — ABNORMAL HIGH (ref 70–99)
Glucose-Capillary: 134 mg/dL — ABNORMAL HIGH (ref 70–99)

## 2014-04-26 LAB — LEGIONELLA ANTIGEN, URINE

## 2014-04-26 LAB — IGA: IGA: 451 mg/dL — AB (ref 69–380)

## 2014-04-26 MED ORDER — FLUTICASONE-SALMETEROL 100-50 MCG/DOSE IN AEPB: 1.0000 | INHALATION_SPRAY | Freq: Two times a day (BID) | RESPIRATORY_TRACT | Status: DC

## 2014-04-26 MED ORDER — ALBUTEROL SULFATE HFA 108 (90 BASE) MCG/ACT IN AERS: 2.0000 | INHALATION_SPRAY | RESPIRATORY_TRACT | Status: DC | PRN

## 2014-04-26 MED ORDER — METOCLOPRAMIDE HCL 10 MG PO TABS
10.0000 mg | ORAL_TABLET | Freq: Four times a day (QID) | ORAL | Status: DC
  Administered 2014-04-26 (×2): 10 mg via ORAL
  Filled 2014-04-26 (×2): qty 1

## 2014-04-26 MED ORDER — GUAIFENESIN-CODEINE 100-10 MG/5ML PO SOLN: 5.0000 mL | ORAL | Status: DC | PRN

## 2014-04-26 MED ORDER — INSULIN GLARGINE 100 UNIT/ML ~~LOC~~ SOLN
54.0000 [IU] | Freq: Every day | SUBCUTANEOUS | Status: DC
  Filled 2014-04-26: qty 0.54

## 2014-04-26 MED ORDER — METOCLOPRAMIDE HCL 10 MG PO TABS: 10.0000 mg | ORAL_TABLET | Freq: Four times a day (QID) | ORAL | Status: DC

## 2014-04-26 MED ORDER — LEVOFLOXACIN 750 MG PO TABS: 750.0000 mg | ORAL_TABLET | Freq: Every day | ORAL | Status: DC

## 2014-04-26 MED ORDER — FERROUS SULFATE 325 (65 FE) MG PO TABS: 325.0000 mg | ORAL_TABLET | Freq: Three times a day (TID) | ORAL | Status: DC

## 2014-04-26 MED ORDER — OMEPRAZOLE 40 MG PO CPDR: 40.0000 mg | DELAYED_RELEASE_CAPSULE | Freq: Every day | ORAL | Status: DC

## 2014-04-26 MED ORDER — HYDROCOD POLST-CHLORPHEN POLST 10-8 MG/5ML PO LQCR: 5.0000 mL | Freq: Two times a day (BID) | ORAL | Status: DC | PRN

## 2014-04-26 MED ORDER — METRONIDAZOLE 500 MG PO TABS: 500.0000 mg | ORAL_TABLET | Freq: Two times a day (BID) | ORAL | Status: DC

## 2014-04-26 MED ORDER — SODIUM CHLORIDE 0.9 % IV BOLUS (SEPSIS)
1000.0000 mL | Freq: Once | INTRAVENOUS | Status: AC
  Administered 2014-04-26: 1000 mL via INTRAVENOUS

## 2014-04-26 MED ORDER — OXYCODONE-ACETAMINOPHEN 5-325 MG PO TABS: 1.0000 | ORAL_TABLET | Freq: Four times a day (QID) | ORAL | Status: DC | PRN

## 2014-04-26 MED ORDER — OMEPRAZOLE 20 MG PO CPDR: 20.0000 mg | DELAYED_RELEASE_CAPSULE | Freq: Every day | ORAL | Status: DC

## 2014-04-26 NOTE — Progress Notes (Signed)
Pt hemoglobin level at 7.3 from prior level of 8.6. Kindly address.

## 2014-04-26 NOTE — Discharge Instructions (Signed)
Thank you for allowing [place] to be involved in your healthcare while you were hospitalized at [facility].   Please note that there have been changes to your home medications.  --> PLEASE LOOK AT YOUR DISCHARGE MEDICATION LIST FOR DETAILS.  Please call your PCP if you have any questions or concerns, or any difficulty getting any of your medications.  Please return to the ER if you have worsening of your symptoms or new severe symptoms arise.  FOR YOUR ITCHING AND DISCHARGE: -Take Flagyl (Metronidazole) 500 mg twice a day for 5 more days  FOR YOUR PNEUMONIA: -Take Levaquin 750 mg (1 tablet) once a day until your finish the bottle  FOR YOUR DIABETIC GASTROPARESIS (NAUSEA): -Take Reglan (Metoclopramide) 10 mg 4 times a day -Take omeprazole 20 mg daily  FOR YOUR ABDOMINAL PAIN: -Take percocet 5-325 1-2 tablets every 6 hours as needed for pain -Follow up in the Internal Medicine Clinic -We will refer you to see an OB/GYN for discussion of removal of you uterus. Your fibroids could be causing your abdominal pain and heavy periods. You may also have endometriosis which can cause pain.  -STOP using cocaine.   Gastroparesis  Gastroparesis is also called slowed stomach emptying (delayed gastric emptying). It is a condition in which the stomach takes too long to empty its contents. It often happens in people with diabetes.  CAUSES  Gastroparesis happens when nerves to the stomach are damaged or stop working. When the nerves are damaged, the muscles of the stomach and intestines do not work normally. The movement of food is slowed or stopped. High blood glucose (sugar) causes changes in nerves and can damage the blood vessels that carry oxygen and nutrients to the nerves. RISK FACTORS  Diabetes.  Post-viral syndromes.  Eating disorders (anorexia, bulimia).  Surgery on the stomach or vagus nerve.  Gastroesophageal reflux disease (rarely).  Smooth muscle disorders  (amyloidosis, scleroderma).  Metabolic disorders, including hypothyroidism.  Parkinson disease. SYMPTOMS   Heartburn.  Feeling sick to your stomach (nausea).  Vomiting of undigested food.  An early feeling of fullness when eating.  Weight loss.  Abdominal bloating.  Erratic blood glucose levels.  Lack of appetite.  Gastroesophageal reflux.  Spasms of the stomach wall. Complications can include:  Bacterial overgrowth in stomach. Food stays in the stomach and can ferment and cause bacteria to grow.  Weight loss due to difficulty digesting and absorbing nutrients.  Vomiting.  Obstruction in the stomach. Undigested food can harden and cause nausea and vomiting.  Blood glucose fluctuations caused by inconsistent food absorption. DIAGNOSIS  The diagnosis of gastroparesis is confirmed through one or more of the following tests:  Barium X-rays and scans. These tests look at how long it takes for food to move through the stomach.  Gastric manometry. This test measures electrical and muscular activity in the stomach. A thin tube is passed down the throat into the stomach. The tube contains a wire that takes measurements of the stomach's electrical and muscular activity as it digests liquids and solid food.  Endoscopy. This procedure is done with a long, thin tube called an endoscope. It is passed through the mouth and gently down the esophagus into the stomach. This tube helps the caregiver look at the lining of the stomach to check for any abnormalities.  Ultrasonography. This can rule out gallbladder disease or pancreatitis. This test will outline and define the shape of the gallbladder and pancreas. TREATMENT   Treatments may include:  Exercise.  Medicines to control nausea and vomiting.  Medicines to stimulate stomach muscles.  Changes in what and when you eat.  Having smaller meals more often.  Eating low-fiber forms of high-fiber foods, such as eating cooked  vegetables instead of raw vegetables.  Eating low-fat foods.  Consuming liquids, which are easier to digest.  In severe cases, feeding tubes and intravenous (IV) feeding may be needed. It is important to note that in most cases, treatment does not cure gastroparesis. It is usually a lasting (chronic) condition. Treatment helps you manage the underlying condition so that you can be as healthy and comfortable as possible. Other treatments  A gastric neurostimulator has been developed to assist people with gastroparesis. The battery-operated device is surgically implanted. It emits mild electrical pulses to help improve stomach emptying and to control nausea and vomiting.  The use of botulinum toxin has been shown to improve stomach emptying by decreasing the prolonged contractions of the muscle between the stomach and the small intestine (pyloric sphincter). The benefits are temporary. SEEK MEDICAL CARE IF:   You have diabetes and you are having problems keeping your blood glucose in goal range.  You are having nausea, vomiting, bloating, or early feelings of fullness with eating.  Your symptoms do not change with a change in diet. Document Released: 04/10/2005 Document Revised: 08/05/2012 Document Reviewed: 09/17/2008 [facility] Patient Information 2015 [place], [place]. This information is not intended to replace advice given to you by your health care provider. Make sure you discuss any questions you have with your health care provider.

## 2014-04-26 NOTE — Progress Notes (Signed)
Subjective:  Patient was seen and examined this morning. Patient feels that her nausea has improved some and she denies any vomiting. She has been tolerating regular food without issues. Patient continues to have LLQ that is better controlled with the pain medication. She continues to have a non-productive cough and denies any shortness of breath, fever or chills.    Objective: Filed Vitals:   04/25/14 1459 04/25/14 2102 04/26/14 0612 04/26/14 0947  BP: 128/70 114/53 122/77 115/86  Pulse: 117 114 106 102  Temp: 98.5 F (36.9 C) 98.7 F (37.1 C) 99.1 F (37.3 C)   TempSrc: Oral Oral Oral   Resp: 18     Height:      Weight:      SpO2: 100% 100% 100%    General: Vital signs reviewed.  Patient is well-developed and well-nourished, in no acute distress and cooperative with exam.  Cardiovascular: Tachycardic, regular rhythm, S1 normal, S2 normal, no murmurs, gallops, or rubs. Pulmonary/Chest: Clear to auscultation bilaterally, no wheezes, rales, or rhonchi. Abdominal: Soft, tender to palpation in LLQ, non-distended, active BS Extremities: No lower extremity edema bilaterally Neurological: A&O x3 Psychiatric: Normal mood and affect. speech and behavior is normal. Cognition and memory are normal.   Lab Results: Basic Metabolic Panel:  Recent Labs Lab 04/22/14 0118  04/24/14 0506 04/26/14 0310  NA  --   < > 136 137  K  --   < > 4.1 3.7  CL  --   < > 108 109  CO2  --   < > 20 20  GLUCOSE  --   < > 398* 213*  BUN  --   < > 9 8  CREATININE  --   < > 0.99 0.84  CALCIUM  --   < > 8.5 7.9*  MG 1.9  --   --   --   < > = values in this interval not displayed. Liver Function Tests:  Recent Labs Lab 04/21/14 1820  AST 16  ALT 7  ALKPHOS 60  BILITOT 0.4  PROT 7.7  ALBUMIN 2.9*    Recent Labs Lab 04/21/14 1820  LIPASE 36   CBC:  Recent Labs Lab 04/21/14 1820 04/22/14 0333  04/25/14 0858 04/26/14 0310  WBC 14.9* 11.1*  < > 17.6* 16.4*  NEUTROABS 10.4* 7.2  --    --   --   HGB 9.6* 8.2*  < > 8.6* 7.3*  HCT 30.2* 26.8*  < > 27.9* 23.5*  MCV 60.9* 61.5*  < > 61.6* 61.8*  PLT 512* 471*  < > 655* 561*  < > = values in this interval not displayed. Cardiac Enzymes:  Recent Labs Lab 04/22/14 0118 04/22/14 0333  TROPONINI <0.03 <0.03   CBG:  Recent Labs Lab 04/25/14 0755 04/25/14 1149 04/25/14 1621 04/25/14 2100 04/26/14 0752 04/26/14 1207  GLUCAP 205* 188* 199* 214* 167* 177*   Anemia Panel:  Recent Labs Lab 04/22/14 0118  VITAMINB12 490  FOLATE 18.1  FERRITIN 15  TIBC Not calculated due to Iron <10.  IRON <10*  RETICCTPCT 1.4   Urine Drug Screen: Drugs of Abuse     Component Value Date/Time   LABOPIA NONE DETECTED 04/22/2014 0037   LABOPIA NEGATIVE 02/21/2010 2201   COCAINSCRNUR POSITIVE* 04/22/2014 0037   COCAINSCRNUR NEGATIVE 02/21/2010 2201   LABBENZ NONE DETECTED 04/22/2014 0037   LABBENZ NEGATIVE 02/21/2010 2201   AMPHETMU NONE DETECTED 04/22/2014 0037   AMPHETMU NEGATIVE 02/21/2010 2201   THCU NONE DETECTED 04/22/2014  [place] DETECTED 04/22/2014 0037    Alcohol Level:  Recent Labs Lab 04/22/14 0118  ETH <5   Urinalysis:  Recent Labs Lab 04/22/14 0037 04/24/14 0634  COLORURINE YELLOW YELLOW  LABSPEC 1.041* 1.019  PHURINE 5.5 5.5  GLUCOSEU >1000* >1000*  HGBUR NEGATIVE NEGATIVE  BILIRUBINUR NEGATIVE NEGATIVE  KETONESUR NEGATIVE NEGATIVE  PROTEINUR NEGATIVE NEGATIVE  UROBILINOGEN 0.2 0.2  NITRITE NEGATIVE NEGATIVE  LEUKOCYTESUR NEGATIVE NEGATIVE    Micro Results: Recent Results (from the past 240 hour(s))  Culture, blood (routine x 2)     Status: None (Preliminary result)   Collection Time: 04/22/14 12:02 AM  Result Value Ref Range Status   Specimen Description BLOOD LEFT ANTECUBITAL  Final   Special Requests BOTTLES DRAWN AEROBIC AND ANAEROBIC 10CC EA  Final   Culture   Final           BLOOD CULTURE RECEIVED NO GROWTH TO DATE CULTURE WILL BE HELD FOR 5 DAYS BEFORE ISSUING A FINAL  NEGATIVE REPORT Performed at [facility]    Report Status PENDING  Incomplete  Culture, blood (routine x 2)     Status: None (Preliminary result)   Collection Time: 04/22/14 12:08 AM  Result Value Ref Range Status   Specimen Description BLOOD LEFT HAND  Final   Special Requests BOTTLES DRAWN AEROBIC AND ANAEROBIC 5CC EA  Final   Culture   Final           BLOOD CULTURE RECEIVED NO GROWTH TO DATE CULTURE WILL BE HELD FOR 5 DAYS BEFORE ISSUING A FINAL NEGATIVE REPORT Performed at [facility]    Report Status PENDING  Incomplete  MRSA PCR Screening     Status: None   Collection Time: 04/22/14  3:50 AM  Result Value Ref Range Status   MRSA by PCR NEGATIVE NEGATIVE Final    Comment:        The GeneXpert MRSA Assay (FDA approved for NASAL specimens only), is one component of a comprehensive MRSA colonization surveillance program. It is not intended to diagnose MRSA infection nor to guide or monitor treatment for MRSA infections.   Respiratory virus panel (routine influenza)     Status: None   Collection Time: 04/22/14  5:34 PM  Result Value Ref Range Status   Source - RVPAN NASAL SWAB  Corrected    Comment: CORRECTED ON 12/31 AT 1923: PREVIOUSLY REPORTED AS NASAL SWAB   Respiratory Syncytial Virus A NOT DETECTED  Final   Respiratory Syncytial Virus B NOT DETECTED  Final   Influenza A NOT DETECTED  Final   Influenza B NOT DETECTED  Final   Parainfluenza 1 NOT DETECTED  Final   Parainfluenza 2 NOT DETECTED  Final   Parainfluenza 3 NOT DETECTED  Final   Metapneumovirus NOT DETECTED  Final   Rhinovirus NOT DETECTED  Final   Adenovirus NOT DETECTED  Final   Influenza A H1 NOT DETECTED  Final   Influenza A H3 NOT DETECTED  Final    Comment: (NOTE)       Normal Reference Range for each Analyte: NOT DETECTED Testing performed using the Luminex xTAG Respiratory Viral Panel test kit. The analytical performance characteristics of this assay have been determined by  [facility].  The modifications have not been cleared or approved by the FDA. This assay has been validated pursuant to the CLIA regulations and is used for clinical purposes. Performed at [facility]    Medications:  I have  reviewed the patient's current medications. Prior to Admission:  Prescriptions prior to admission  Medication Sig Dispense Refill Last Dose  . albuterol (PROVENTIL HFA;VENTOLIN HFA) 108 (90 BASE) MCG/ACT inhaler Inhale 2 puffs into the lungs every 4 (four) hours as needed for wheezing or shortness of breath.   Past Month at Unknown time  . gabapentin (NEURONTIN) 300 MG capsule Take 300 mg by mouth 3 (three) times daily.    Past Week at Unknown time  . insulin aspart (NOVOLOG) 100 UNIT/ML injection Sliding scale- If sugar 150-199: inject 2 unit;  200-249: 4 units, 250-299: 7 units; 300-349: 10 units; Over 350: 12 units (Patient taking differently: Inject 2-12 Units into the skin 3 (three) times daily with meals. Sliding scale- If sugar 150-199: inject 2 unit;  200-249: 4 units, 250-299: 7 units; 300-349: 10 units; Over 350: 12 units) 10 mL 2 Past Week at Unknown time  . Insulin Glargine (LANTUS SOLOSTAR) 100 UNIT/ML Solostar Pen Inject 50 Units into the skin daily at 10 pm. 15 mL 2 Past Week at Unknown time  . labetalol (NORMODYNE) 100 MG tablet Take 1 tablet (100 mg total) by mouth 2 (two) times daily. 180 tablet 3 Past Week at pt does not rememeber  . LORazepam (ATIVAN) 1 MG tablet Take 1 mg by mouth every 8 (eight) hours as needed for anxiety.   Past Week at Unknown time  . naproxen sodium (ANAPROX) 220 MG tablet Take 440 mg by mouth 2 (two) times daily as needed (pain).   Past Week at Unknown time  . omeprazole (PRILOSEC) 20 MG capsule Take 1 capsule (20 mg total) by mouth daily. 30 capsule 0 Past Week at Unknown time  . promethazine (PHENERGAN) 25 MG suppository Place 1 suppository (25 mg total) rectally every 6 (six) hours as needed for nausea or  vomiting. 12 each 0 Past Week at Unknown time  . senna-docusate (SENOKOT-S) 8.6-50 MG per tablet Take 1 tablet by mouth daily.   Past Week at Unknown time  . atorvastatin (LIPITOR) 80 MG tablet Take 1 tablet (80 mg total) by mouth daily at 6 PM. (Patient not taking: Reported on 03/11/2014) 30 tablet 2 Past Week at Unknown time  . dicyclomine (BENTYL) 20 MG tablet Take 1 tablet (20 mg total) by mouth 2 (two) times daily. (Patient not taking: Reported on 04/03/2014) 20 tablet 0 Past Week at Unknown time   Scheduled Meds: . atorvastatin  40 mg Oral q1800  . enoxaparin (LOVENOX) injection  40 mg Subcutaneous Daily  . ferrous sulfate  325 mg Oral TID WC  . gabapentin  300 mg Oral TID  . insulin aspart  0-20 Units Subcutaneous TID WC  . insulin aspart  7 Units Subcutaneous TID WC  . insulin glargine  54 Units Subcutaneous QHS  . labetalol  100 mg Oral BID  . levofloxacin  750 mg Oral Daily  . magnesium citrate  1 Bottle Oral Once  . metoCLOPramide  10 mg Oral Q6H  . metroNIDAZOLE  500 mg Oral Q12H  . pantoprazole (PROTONIX) IV  40 mg Intravenous Q24H  . senna-docusate  1 tablet Oral QHS   Continuous Infusions:   PRN Meds:.acetaminophen **OR** acetaminophen, albuterol, guaiFENesin-codeine, oxyCODONE-acetaminophen, promethazine Assessment/Plan: Principal Problem:   HCAP (healthcare-associated pneumonia) Active Problems:   Poorly controlled type 2 diabetes mellitus with peripheral neuropathy   Hyperlipidemia   OBESITY, MORBID   ANXIETY DEPRESSION   Polysubstance abuse   Essential hypertension   Chronic ischemic heart disease   Asthma  Iron deficiency anemia   NAUSEA AND VOMITING   Chest pain   Hypotension   History of Helicobacter pylori infection  HCAP: Seen on CXR. Legionella negative, UA negative, RSV negative. Blood cultures are NGTD. Patient is asymptomatic. WBC was trending 9>11.8>17.6>16.4.  -CBC tomorrow -Levaquin 750 mg daily (Started 12/30 >>04/28/14) -Blood cultures  pending -Robitussin AC 5 mL Q4H prn   Diabetic Gastroparesis: Nausea improved and patient has had no recurrent vomiting. GI increased Reglan to 10 mg IV Q6H.  LLQ pain liekly secondary to large uterine fibroid or abdominal angina given CAD and Cocaine Use. The rest of her work up has been negative thus far. It would be reasonable for patient to follow up with OB/GYN on discharge to consider hysterectomy and for patient to stop using cocaine.  -Continue PPI -H. Pylori antigen pending -FOBT pending -IgA in process  -Heart Healthy Diet -Gastric biopsies pending -Senokot 1 tab QHS -Gastroenterology following, appreciate recommendations -Reglan 10 mg IV Q6H  -Percocet 5-325 1-2 tabs Q6H prn  Orthostatic Hypotension: Patient's orthostatics were positive 2 days ago. Yesterday, patient was given a NS I L bolus and given NS IV 125 mL/hr for 8 hours. -Recheck orthostatics   Iron Deficiency Anemia: Baseline around 9-10. Admission Hgb 9.6>7.7>8.4>8.6>7.3. Iron level low <10, TIBC not calculated, ferritin low-normal at 15, folate and vitamin B12 normal. Patient was given Fereheme 1,020 mg once 2 days and restarted on ferrous sulfate 325 mg TID WC. Hemoglobin decrease from yesterday likley secondary to 2 L of fluid patient received yesterday. As noted above, her hemoglobin continues to flux. She denies any loss of blood in her stool, however FOBT has not been collected. -FOBT pending -Ferrous Sulfate 325 mg TID WC, monitor for increased constipation -Senokot-S 1 tab QHS   Uncontrolled DM2 with peripheral neuropathy: Last A1C 10.3 on 02/18/14.CBGs better controlled on new regimen 160s-200s.  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 Patient never started treatment and continues to have symptoms. -Metronidazole 500 mg BID (start date 04/24/14; stop date 05/01/14)  DVT/PE ppx: Lovenox SQ  daily  Dispo: Disposition is deferred at this time, awaiting improvement of current medical problems.  Anticipated discharge in approximately 1-2 day(s).   The patient does have a current PCP Luan Moore, MD) and does need an [facility] hospital follow-up appointment after discharge.  The patient does not have transportation limitations that hinder transportation to clinic appointments.  .Services Needed at time of discharge: Y = Yes, Blank = No PT:   OT:   RN:   Equipment:   Other:     LOS: 5 days   Osa Craver, DO PGY-1 Internal Medicine Resident Pager # [phone] 04/26/2014 1:23 PM

## 2014-04-28 DIAGNOSIS — E1143 Type 2 diabetes mellitus with diabetic autonomic (poly)neuropathy: Secondary | ICD-10-CM

## 2014-04-28 DIAGNOSIS — K3184 Gastroparesis: Secondary | ICD-10-CM

## 2014-04-28 LAB — CULTURE, BLOOD (ROUTINE X 2)
CULTURE: NO GROWTH
CULTURE: NO GROWTH

## 2014-04-28 NOTE — Discharge Summary (Signed)
Name: Shirley [name] MRN: [id number] DOB: 1975/09/03 39 y.o. PCP: Luan Moore, MD  Date of Admission: 04/21/2014 10:01 PM Date of Discharge: 04/28/2014 Attending Physician: Dr. Daryll Drown  Discharge Diagnosis:  Principal Problem:   HCAP (healthcare-associated pneumonia) Active Problems:   Poorly controlled type 2 diabetes mellitus with peripheral neuropathy   Hyperlipidemia   OBESITY, MORBID   ANXIETY DEPRESSION   Polysubstance abuse   Essential hypertension   Chronic ischemic heart disease   Asthma   Iron deficiency anemia   Diabetic gastroparesis associated with type 2 diabetes mellitus  Discharge Medications:   Medication List    TAKE these medications        albuterol 108 (90 BASE) MCG/ACT inhaler  Commonly known as:  PROVENTIL HFA;VENTOLIN HFA  Inhale 2 puffs into the lungs every 4 (four) hours as needed for wheezing or shortness of breath.     atorvastatin 80 MG tablet  Commonly known as:  LIPITOR  Take 1 tablet (80 mg total) by mouth daily at 6 PM.     chlorpheniramine-HYDROcodone 10-8 MG/5ML Lqcr  Commonly known as:  TUSSIONEX  Take 5 mLs by mouth every 12 (twelve) hours as needed for cough.     dicyclomine 20 MG tablet  Commonly known as:  BENTYL  Take 1 tablet (20 mg total) by mouth 2 (two) times daily.     ferrous sulfate 325 (65 FE) MG tablet  Take 1 tablet (325 mg total) by mouth 3 (three) times daily with meals.     Fluticasone-Salmeterol 100-50 MCG/DOSE Aepb  Commonly known as:  ADVAIR  Inhale 1 puff into the lungs 2 (two) times daily.     gabapentin 300 MG capsule  Commonly known as:  NEURONTIN  Take 300 mg by mouth 3 (three) times daily.     insulin aspart 100 UNIT/ML injection  Commonly known as:  novoLOG  Sliding scale- If sugar 150-199: inject 2 unit;  200-249: 4 units, 250-299: 7 units; 300-349: 10 units; Over 350: 12 units     Insulin Glargine 100 UNIT/ML Solostar Pen  Commonly known as:  LANTUS SOLOSTAR  Inject 50 Units into the  skin daily at 10 pm.     labetalol 100 MG tablet  Commonly known as:  NORMODYNE  Take 1 tablet (100 mg total) by mouth 2 (two) times daily.     levofloxacin 750 MG tablet  Commonly known as:  LEVAQUIN  Take 1 tablet (750 mg total) by mouth daily.     LORazepam 1 MG tablet  Commonly known as:  ATIVAN  Take 1 mg by mouth every 8 (eight) hours as needed for anxiety.     metoCLOPramide 10 MG tablet  Commonly known as:  REGLAN  Take 1 tablet (10 mg total) by mouth every 6 (six) hours.     metroNIDAZOLE 500 MG tablet  Commonly known as:  FLAGYL  Take 1 tablet (500 mg total) by mouth every 12 (twelve) hours.     naproxen sodium 220 MG tablet  Commonly known as:  ANAPROX  Take 440 mg by mouth 2 (two) times daily as needed (pain).     omeprazole 20 MG capsule  Commonly known as:  PRILOSEC  Take 1 capsule (20 mg total) by mouth daily.     oxyCODONE-acetaminophen 5-325 MG per tablet  Commonly known as:  PERCOCET/ROXICET  Take 1-2 tablets by mouth every 6 (six) hours as needed for severe pain.     promethazine 25 MG suppository  Commonly known  as:  PHENERGAN  Place 1 suppository (25 mg total) rectally every 6 (six) hours as needed for nausea or vomiting.     senna-docusate 8.6-50 MG per tablet  Commonly known as:  Senokot-S  Take 1 tablet by mouth daily.        Disposition and follow-up:   Shirley [name] was discharged from [facility] in Good condition.  At the hospital follow up visit please address:  1.  HCAP with Sepsis: Patient was discharged on Levaquin 750 mg QD (stop 04/28/14) and Tussionex. Please assess resolution of symptoms and completion of antibiotics.   Diabetic Gastroparesis: On Reglan 10 mg Q8H. Results of gastric biopsies are still pending. Please follow up results.   Chronic LLQ Pain: Due to abdominal angina versus fibroid versus endometriosis. Please consider OB/GYN referral.  Iron Deficiency Anemia: Iron level low <10, TIBC not  calculated, ferritin low-normal at 15, folate and vitamin B12 normal. Patient was given Fereheme 1,020 mg IV and then started on Ferrous Sulfate 325 mg TID WC. Patient is on Senokot QHS prn for constipation. Please assess if she is having regular bowel movements.   Uncontrolled DM2 with peripheral neuropathy: We continued her on her home Lantus 50 units and SSI. Please assess glucose control at home and if patient requires additional Lantus.   2.  Labs / imaging needed at time of follow-up: CBG, OB/GYN Referral  3.  Pending labs/ test needing follow-up: Gastric Biopsies  Follow-up Appointments: Follow-up Information    Follow up with Michail Jewels, MD On 05/01/2014.   Specialty:  Internal Medicine   Why:  2:15PM for hospital follow-up    Contact information:   [place] [id number] [phone]       Discharge Instructions: Discharge Instructions    Diet - low sodium heart healthy    Complete by:  As directed      Increase activity slowly    Complete by:  As directed            Consultations: Treatment Team:  Winfield Cunas., MD Missy Sabins, MD  Procedures Performed:  Dg Chest 2 View  04/23/2014   CLINICAL DATA:  Cough, chest pain.  EXAM: CHEST  2 VIEW  COMPARISON:  04/21/2014  FINDINGS: Stable cardiac and mediastinal contours. Interval development of consolidative opacities within the peripheral right mid and lower lung. Interval worsening of opacities within the left upper lung. No definite pleural effusion or pneumothorax. Mid thoracic spine degenerative changes. Leads overlie the patient.  IMPRESSION: Interval development of consolidative opacities within the peripheral right mid and lower lung as well as increasing consolidative opacities within the left upper lung, concerning for multi focal pneumonia. Continued radiographic followup is recommended to ensure resolution and exclude the possibility of underlying mass.   Electronically Signed   By: Lovey Newcomer M.D.   On: 04/23/2014 08:02   Dg Chest 2 View  04/21/2014   CLINICAL DATA:  Chest pain for 3 days  EXAM: CHEST  2 VIEW  COMPARISON:  04/03/2014  FINDINGS: Heart size and mediastinal contours are normal. Developing opacity within the left upper lobe in the projection of the anterior first rib is identified and appears increased from 04/03/2014. Right lung is clear. No pleural effusion identified.  IMPRESSION: 1. Developing a left upper lobe opacity is likely infectious in etiology. Radiographic followup is recommended to ensure resolution.   Electronically Signed   By: Kerby Moors M.D.   On:  04/21/2014 19:10   Dg Chest 2 View  04/03/2014   CLINICAL DATA:  Vomiting, weakness.  EXAM: CHEST  2 VIEW  COMPARISON:  March 11, 2014.  FINDINGS: The heart size and mediastinal contours are within normal limits. Both lungs are clear. No pneumothorax or pleural effusion is noted. The visualized skeletal structures are unremarkable.  IMPRESSION: No active cardiopulmonary disease.   Electronically Signed   By: Sabino Dick M.D.   On: 04/03/2014 12:15   US Abdomen Limited  04/03/2014   CLINICAL DATA:  39 year old female with right upper quadrant abdominal pain  EXAM: US ABDOMEN LIMITED - RIGHT UPPER QUADRANT  COMPARISON:  Prior CT abdomen/pelvis 12/31/2013  FINDINGS: Gallbladder:  Small amounts of minimally echogenic mobile material within the gallbladder lumen consistent with sludge. The gallbladder wall is not thickened and there is no pericholecystic fluid. Per the sonographer, the sonographic Percell Miller sign was negative.  Common bile duct:  Diameter:  Within normal limits at 3 mm  Liver:  No focal lesion identified. Within normal limits in parenchymal echogenicity. Main portal vein patent with hepatopetal flow.  IMPRESSION: 1. Trace gallbladder sludge without sonographic findings to suggest cholecystitis. 2. Otherwise, unremarkable right upper quadrant abdominal ultrasound.   Electronically Signed   By:  Jacqulynn Cadet M.D.   On: 04/03/2014 13:00   Admission HPI: Shirley [name] is a 39 year old woman with history of iron deficiency anemia secondary to menorrhagia, HTN, HLD, DM2 with peripheral neuropathy and gastroparesis, H pylori gastritis in 2011, OSA, CAD s/p angioplasty in 2008, ashtma, GERD, cocaine use, and fibromyalgia presenting with chest pain associated with nausea and vomiting. She has had chronic nausea and vomiting for the past 2 years, and has multiple similar presentations to the ER in the past year and was admitted with similar symptoms on 04/03/14. UDS was positive for cocaine and THC at that time and chest pain work-up was unremarkable. She says that she started having non-bloody emesis 4 days ago with diffuse abdominal pain following the vomiting. She says she developed chest pain the following day that is similar to her previous episodes. It is substernal and feels like sharp and squeezing and is associated with shortness of breath. It is worse with deep breaths. She also reports a non-productive cough for the past couple of weeks that has gotten worse the past couple of days. She has been having chills and has been diaphoretic and light-headed, but she denies fevers. She denies diarrhea, melena, or hematochezia and says her last bowel movement was yesterday afternoon. She says she last used cocaine a week ago. Her most recent cath was in 2010 and demonstrated no restenosis or significant CAD progression. In the ER, she was noted to be tachycardic to 128. She had an episode of hypotension to the 64Q systolic and was reportedly groggy per the ER physician. She was given a 1 L NS bolus with improvement in her blood pressure.  Hospital Course by problem list: Principal Problem:   HCAP (healthcare-associated pneumonia) Active Problems:   Poorly controlled type 2 diabetes mellitus with peripheral neuropathy   Hyperlipidemia   OBESITY, MORBID   ANXIETY DEPRESSION    Polysubstance abuse   Essential hypertension   Chronic ischemic heart disease   Asthma   Iron deficiency anemia   Diabetic gastroparesis associated with type 2 diabetes mellitus   HCAP with Sepsis: Patient was tachycardic on admission with elevated WBC and LUL opacity on chest x-ray.Patient was started on Levaquin in the ED and  switched to broader coverage with cefepime and vancomycin, then de-escalated to Levaquin 750 mg QD with stop date of 04/28/14.Marland KitchenProcalcitonin negative, lactic acid normal at 1.7. Negative strep pneumo, Legionella, and RSV. Blood cultures were NGTD. Patient has a non-productive cough but denies shortness of breath. Patient was discharged on Levaquin 750 mg QD (stop 04/28/14) and Tussionex. Please assess resolution of symptoms and completion of antibiotics.   Diabetic Gastroparesis: Patient has a chronic history of N/V/Abdominal pain which has been worked up in the past with many negative CT scans. Patient never followed up with GI and has never had a colonoscopy. Nausea and vomiting are likely secondary to gastroparesis, though gastric emptying study was negative in the past while on Reglan.An EGD done in November 2011 showed mild gastritis; stomach biopsy showed moderate chronic active H. pylori gastritis, and duodenal biopsy showed variable villous blunting and mild plasma cell lamina propria expansion with increased intraepithelial lymphocytes.She does have history of H pylori Gastritis, but she was treated previously. We consulted GI for possible colonoscopy and work up for H. Pylori. Pre-albumin normal at 18.3. Patient was seen by GI who performed an EGD which was positive for retained food, likely due to gastroparesis. They recommended a gastric emptying scan, but patient refused to eat the eggs or oatmeal. She was started on and discharged on Reglan 10 mg PO Q6H. IgA level was mildly elevated at 451, but Tissue Transglutaminase IgA was normal at 1, making the diagnosis of Celiac  Disease very unlikely. Results of gastric biopsies are still pending. Please follow up results.   Chronic LLQ Pain: Patient continued to have LLQ during admission that was at her baseline. Patient has had this pain for over 6 years and has multiple CT scans and GI work up as above. Patient has never had a colonoscopy. Results were negative for Celiac Disease. Patient cannot attribute her symptoms to any cause or state what makes it worse or better (except pain medication). Patient is not a candidate for chronic opioids due to cocaine abuse. Patient's LLQ pain may be secondary to abdominal angina since she does have CAD and uses cocaine. However, patient states she uses cocaine infrequently and it makes her abdominal pain better. We have advised her to quit. Pain is likely gynecologic in nature. She does have a large fibroid that causes her to have heavy, painful periods, yet she has pain unrelated to her menstrual cycle as well. Pain could also be secondary to Endometriosis. We recommend following up with an OB/GYN. Please consider OB/GYN referral.  Iron Deficiency Anemia: Patient's baseline hemoglobin is around 9-10. Admission Hgb 9.6. Ferritin 7 on 07/25/13, but she was not taking iron. Thrombocytosis is thought to be due to iron deficiency. Iron level low <10, TIBC not calculated, ferritin low-normal at 15, folate and vitamin B12 normal. Patient was given Fereheme 1,020 mg IV and then started on Ferrous Sulfate 325 mg TID WC. Patient is on Senokot QHS prn for constipation. Please assess if she is having regular bowel movements.   Uncontrolled DM2 with peripheral neuropathy: Patient's last HgbA1C was 10.3 on 02/18/14. We managed her on Lantus 54 units during her stay and SSI resistant. We continued her on her home Lantus 50 units and SSI. Please assess glucose control at home and if patient requires additional Lantus.   HLD: LDL elevated at 108 on 07/21/13. We continued the patient's home statin during her  stay and on discharge.  Asthma: Patient's asthma remained stable during her stay and she had  Albuterol nebs Q6H prn ordered. I refilled her albuterol inhaler on discharge. Patient was questioning if she could be set up with a home nebulizer. Please assess this on follow up.  Discharge Vitals:   BP 115/86 mmHg  Pulse 102  Temp(Src) 99.1 F (37.3 C) (Oral)  Resp 18  Ht 5\' 7"  (1.702 m)  Wt 97.841 kg (215 lb 11.2 oz)  BMI 33.78 kg/m2  SpO2 100%  LMP 03/24/2014  Signed: Osa Craver, DO PGY-1 Internal Medicine Resident Pager # [phone] 04/28/2014 3:22 PM

## 2014-05-01 ENCOUNTER — Encounter: Payer: Self-pay | Admitting: Internal Medicine

## 2014-05-01 ENCOUNTER — Ambulatory Visit: Payer: Medicaid Other | Admitting: Internal Medicine

## 2014-05-05 ENCOUNTER — Emergency Department (HOSPITAL_COMMUNITY)
Admission: EM | Admit: 2014-05-05 | Discharge: 2014-05-06 | Disposition: A | Discharge status: Home / Self Care | Payer: Medicaid Other | Attending: Emergency Medicine | Admitting: Emergency Medicine

## 2014-05-05 ENCOUNTER — Emergency Department (HOSPITAL_COMMUNITY): Payer: Medicaid Other

## 2014-05-05 ENCOUNTER — Encounter (HOSPITAL_COMMUNITY): Payer: Self-pay

## 2014-05-05 DIAGNOSIS — R109 Unspecified abdominal pain: Secondary | ICD-10-CM | POA: Insufficient documentation

## 2014-05-05 DIAGNOSIS — G8929 Other chronic pain: Secondary | ICD-10-CM | POA: Insufficient documentation

## 2014-05-05 DIAGNOSIS — Z87891 Personal history of nicotine dependence: Secondary | ICD-10-CM | POA: Insufficient documentation

## 2014-05-05 DIAGNOSIS — Z79899 Other long term (current) drug therapy: Secondary | ICD-10-CM | POA: Insufficient documentation

## 2014-05-05 DIAGNOSIS — E785 Hyperlipidemia, unspecified: Secondary | ICD-10-CM | POA: Diagnosis not present

## 2014-05-05 DIAGNOSIS — Z3202 Encounter for pregnancy test, result negative: Secondary | ICD-10-CM | POA: Diagnosis not present

## 2014-05-05 DIAGNOSIS — I10 Essential (primary) hypertension: Secondary | ICD-10-CM | POA: Insufficient documentation

## 2014-05-05 DIAGNOSIS — Z794 Long term (current) use of insulin: Secondary | ICD-10-CM | POA: Insufficient documentation

## 2014-05-05 DIAGNOSIS — K3184 Gastroparesis: Secondary | ICD-10-CM | POA: Diagnosis not present

## 2014-05-05 DIAGNOSIS — Z792 Long term (current) use of antibiotics: Secondary | ICD-10-CM | POA: Insufficient documentation

## 2014-05-05 DIAGNOSIS — R112 Nausea with vomiting, unspecified: Secondary | ICD-10-CM | POA: Diagnosis not present

## 2014-05-05 DIAGNOSIS — Z8701 Personal history of pneumonia (recurrent): Secondary | ICD-10-CM | POA: Insufficient documentation

## 2014-05-05 DIAGNOSIS — E119 Type 2 diabetes mellitus without complications: Secondary | ICD-10-CM | POA: Diagnosis not present

## 2014-05-05 DIAGNOSIS — Z9861 Coronary angioplasty status: Secondary | ICD-10-CM | POA: Diagnosis not present

## 2014-05-05 DIAGNOSIS — R1114 Bilious vomiting: Secondary | ICD-10-CM

## 2014-05-05 DIAGNOSIS — Z8619 Personal history of other infectious and parasitic diseases: Secondary | ICD-10-CM | POA: Diagnosis not present

## 2014-05-05 DIAGNOSIS — M797 Fibromyalgia: Secondary | ICD-10-CM | POA: Diagnosis not present

## 2014-05-05 LAB — COMPREHENSIVE METABOLIC PANEL
ALBUMIN: 3.9 g/dL (ref 3.5–5.2)
ALT: 11 U/L (ref 0–35)
AST: 18 U/L (ref 0–37)
Alkaline Phosphatase: 72 U/L (ref 39–117)
Anion gap: 9 (ref 5–15)
BILIRUBIN TOTAL: 0.6 mg/dL (ref 0.3–1.2)
BUN: 10 mg/dL (ref 6–23)
CALCIUM: 9.5 mg/dL (ref 8.4–10.5)
CO2: 26 mmol/L (ref 19–32)
CREATININE: 0.81 mg/dL (ref 0.50–1.10)
Chloride: 101 mEq/L (ref 96–112)
GFR calc Af Amer: >90 mL/min (ref >90)
GFR calc non Af Amer: 90 mL/min — ABNORMAL LOW (ref >90)
Glucose, Bld: 237 mg/dL — ABNORMAL HIGH (ref 70–99)
Potassium: 3.8 mmol/L (ref 3.5–5.1)
SODIUM: 136 mmol/L (ref 135–145)
Total Protein: 8.9 g/dL — ABNORMAL HIGH (ref 6.0–8.3)

## 2014-05-05 LAB — URINE MICROSCOPIC-ADD ON

## 2014-05-05 LAB — CBC WITH DIFFERENTIAL/PLATELET
BASOS ABS: 0 10*3/uL (ref 0.0–0.1)
BASOS PCT: 0 % (ref 0–1)
Eosinophils Absolute: 0.6 10*3/uL (ref 0.0–0.7)
Eosinophils Relative: 4 % (ref 0–5)
HCT: 35.9 % — ABNORMAL LOW (ref 36.0–46.0)
HEMOGLOBIN: 11.5 g/dL — AB (ref 12.0–15.0)
LYMPHS ABS: 2.7 10*3/uL (ref 0.7–4.0)
LYMPHS PCT: 19 % (ref 12–46)
MCH: 21.7 pg — AB (ref 26.0–34.0)
MCHC: 32 g/dL (ref 30.0–36.0)
MCV: 67.9 fL — ABNORMAL LOW (ref 78.0–100.0)
MONO ABS: 0.7 10*3/uL (ref 0.1–1.0)
MONOS PCT: 5 % (ref 3–12)
NEUTROS ABS: 10.2 10*3/uL — AB (ref 1.7–7.7)
Neutrophils Relative %: 72 % (ref 43–77)
Platelets: 538 10*3/uL — ABNORMAL HIGH (ref 150–400)
RBC: 5.29 MIL/uL — ABNORMAL HIGH (ref 3.87–5.11)
RDW: 28.1 % — ABNORMAL HIGH (ref 11.5–15.5)
WBC: 14.2 10*3/uL — ABNORMAL HIGH (ref 4.0–10.5)

## 2014-05-05 LAB — LIPASE, BLOOD: Lipase: 28 U/L (ref 11–59)

## 2014-05-05 LAB — URINALYSIS, ROUTINE W REFLEX MICROSCOPIC
BILIRUBIN URINE: NEGATIVE
GLUCOSE, UA: 500 mg/dL — AB
Ketones, ur: NEGATIVE mg/dL
LEUKOCYTES UA: NEGATIVE
NITRITE: NEGATIVE
PH: 8 (ref 5.0–8.0)
Protein, ur: 30 mg/dL — AB
Specific Gravity, Urine: 1.019 (ref 1.005–1.030)
UROBILINOGEN UA: 1 mg/dL (ref 0.0–1.0)

## 2014-05-05 LAB — PREGNANCY, URINE: Preg Test, Ur: NEGATIVE

## 2014-05-05 MED ORDER — LORAZEPAM 2 MG/ML IJ SOLN
0.5000 mg | Freq: Once | INTRAMUSCULAR | Status: AC
  Administered 2014-05-05: 0.5 mg via INTRAVENOUS
  Filled 2014-05-05: qty 1

## 2014-05-05 MED ORDER — SODIUM CHLORIDE 0.9 % IV BOLUS (SEPSIS)
1000.0000 mL | Freq: Once | INTRAVENOUS | Status: AC
  Administered 2014-05-05: 1000 mL via INTRAVENOUS

## 2014-05-05 MED ORDER — PROMETHAZINE HCL 25 MG/ML IJ SOLN
12.5000 mg | Freq: Once | INTRAMUSCULAR | Status: AC
  Administered 2014-05-05: 12.5 mg via INTRAVENOUS
  Filled 2014-05-05: qty 1

## 2014-05-05 MED ORDER — HYDROMORPHONE HCL 1 MG/ML IJ SOLN
1.0000 mg | Freq: Once | INTRAMUSCULAR | Status: AC
  Administered 2014-05-05: 1 mg via INTRAVENOUS
  Filled 2014-05-05: qty 1

## 2014-05-05 MED ORDER — PANTOPRAZOLE SODIUM 40 MG IV SOLR
40.0000 mg | Freq: Once | INTRAVENOUS | Status: AC
  Administered 2014-05-05: 40 mg via INTRAVENOUS
  Filled 2014-05-05: qty 40

## 2014-05-05 MED ORDER — METOCLOPRAMIDE HCL 5 MG/ML IJ SOLN
10.0000 mg | Freq: Once | INTRAMUSCULAR | Status: AC
  Administered 2014-05-05: 10 mg via INTRAVENOUS
  Filled 2014-05-05: qty 2

## 2014-05-05 MED ORDER — GI COCKTAIL ~~LOC~~
30.0000 mL | Freq: Once | ORAL | Status: AC
  Administered 2014-05-05: 30 mL via ORAL
  Filled 2014-05-05: qty 30

## 2014-05-05 NOTE — ED Notes (Signed)
Pt received 127mcg of fentanyl and 8mg  zofran in route per EMS

## 2014-05-05 NOTE — ED Notes (Signed)
Bed: KK44 Expected date: 05/05/14 Expected time: 8:29 PM Means of arrival: Ambulance Comments: Abdominal pain

## 2014-05-05 NOTE — ED Provider Notes (Signed)
CSN: [id number]     Arrival date & time 05/05/14  2048 History   First MD Initiated Contact with Patient 05/05/14 2101     Chief Complaint  Patient presents with  . Abdominal Pain     (Consider location/radiation/quality/duration/timing/severity/associated sxs/prior Treatment) HPI Shirley [name] is a 39 y.o. female with history of diabetes, hyperlipidemia, hypertension, chronic pain, fibromyalgia, hepatitis B, presents to emergency department complaining of nausea, vomiting, left-sided abdominal pain onset several hours ago. Patient with recent admission for similar symptoms as well as chest pain. Shirley [name] was discharged on 04/29/2014 after being in the hospital for 8 days. Shirley [name] was diagnosed with pneumonia and gastroparesis. Shirley [name] was discharged home with Reglan. Shirley [name] states Shirley [name] was doing well until today. Shirley [name] states Shirley [name] is unable to keep anything down for last several hours, persistent nausea and vomiting. Denies any diarrhea. No urinary symptoms. States that her pneumonia symptoms have improved. Denies any drugs or alcohol. Patient is actively vomiting and unable to provide any more history at this time.   Past Surgical History  Procedure Laterality Date  . Cesarean section  1997  . Coronary angioplasty with stent placement  2008    "2 stents"  . Esophagogastroduodenoscopy N/A 04/23/2014    Procedure: ESOPHAGOGASTRODUODENOSCOPY (EGD);  Surgeon: Winfield Cunas., MD;  Location: [facility] ENDOSCOPY;  Service: Endoscopy;  Laterality: N/A;   Family History  Problem Relation Age of Onset  . Diabetes Father    History  Substance Use Topics  . Smoking status: Former Smoker    Types: Cigarettes    Quit date: 04/24/1996  . Smokeless tobacco: Never Used     Comment: quit smoking cigarettes age 68  . Alcohol Use: Yes     Comment: 04/22/2014 "might have a few drinks a month"   OB History    No data available     Review of Systems  Constitutional: Negative for fever and chills.  Respiratory:  Negative for cough, chest tightness and shortness of breath.   Cardiovascular: Negative for chest pain, palpitations and leg swelling.  Gastrointestinal: Positive for nausea and abdominal pain. Negative for vomiting, diarrhea and blood in stool.  Genitourinary: Negative for dysuria, flank pain, vaginal bleeding, vaginal discharge, vaginal pain and pelvic pain.  Musculoskeletal: Negative for myalgias, arthralgias, neck pain and neck stiffness.  Skin: Negative for rash.  Neurological: Negative for dizziness, weakness and headaches.  All other systems reviewed and are negative.     Allergies  Lisinopril and Morphine and related  Home Medications   Prior to Admission medications   Medication Sig Start Date End Date Taking? Authorizing Provider  albuterol (PROVENTIL HFA;VENTOLIN HFA) 108 (90 BASE) MCG/ACT inhaler Inhale 2 puffs into the lungs every 4 (four) hours as needed for wheezing or shortness of breath. 04/26/14   Osa Craver, MD  atorvastatin (LIPITOR) 80 MG tablet Take 1 tablet (80 mg total) by mouth daily at 6 PM. Patient not taking: Reported on 03/11/2014 07/25/13   Dixon Boos, MD  chlorpheniramine-HYDROcodone (TUSSIONEX) 10-8 MG/5ML LQCR Take 5 mLs by mouth every 12 (twelve) hours as needed for cough. 04/26/14   Osa Craver, MD  dicyclomine (BENTYL) 20 MG tablet Take 1 tablet (20 mg total) by mouth 2 (two) times daily. Patient not taking: Reported on 04/03/2014 12/31/13   Hoy Morn, MD  ferrous sulfate 325 (65 FE) MG tablet Take 1 tablet (325 mg total) by mouth 3 (three) times daily with meals. 04/26/14   Osa Craver, MD  Fluticasone-Salmeterol (ADVAIR)  100-50 MCG/DOSE AEPB Inhale 1 puff into the lungs 2 (two) times daily. 04/26/14   Osa Craver, MD  gabapentin (NEURONTIN) 300 MG capsule Take 300 mg by mouth 3 (three) times daily.     Historical Provider, MD  insulin aspart (NOVOLOG) 100 UNIT/ML injection Sliding scale- If sugar 150-199: inject 2 unit;  200-249:  4 units, 250-299: 7 units; 300-349: 10 units; Over 350: 12 units Patient taking differently: Inject 2-12 Units into the skin 3 (three) times daily with meals. Sliding scale- If sugar 150-199: inject 2 unit;  200-249: 4 units, 250-299: 7 units; 300-349: 10 units; Over 350: 12 units 11/11/13   Bartholomew Crews, MD  Insulin Glargine (LANTUS SOLOSTAR) 100 UNIT/ML Solostar Pen Inject 50 Units into the skin daily at 10 pm. 11/11/13   Bartholomew Crews, MD  labetalol (NORMODYNE) 100 MG tablet Take 1 tablet (100 mg total) by mouth 2 (two) times daily. 03/05/14   Karren Cobble, MD  levofloxacin (LEVAQUIN) 750 MG tablet Take 1 tablet (750 mg total) by mouth daily. 04/26/14   Osa Craver, MD  LORazepam (ATIVAN) 1 MG tablet Take 1 mg by mouth every 8 (eight) hours as needed for anxiety.    Historical Provider, MD  metoCLOPramide (REGLAN) 10 MG tablet Take 1 tablet (10 mg total) by mouth every 6 (six) hours. 04/26/14   Osa Craver, MD  metroNIDAZOLE (FLAGYL) 500 MG tablet Take 1 tablet (500 mg total) by mouth every 12 (twelve) hours. 04/26/14   Osa Craver, MD  naproxen sodium (ANAPROX) 220 MG tablet Take 440 mg by mouth 2 (two) times daily as needed (pain).    Historical Provider, MD  omeprazole (PRILOSEC) 20 MG capsule Take 1 capsule (20 mg total) by mouth daily. 04/26/14   Osa Craver, MD  oxyCODONE-acetaminophen (PERCOCET/ROXICET) 5-325 MG per tablet Take 1-2 tablets by mouth every 6 (six) hours as needed for severe pain. 04/26/14   Osa Craver, MD  promethazine (PHENERGAN) 25 MG suppository Place 1 suppository (25 mg total) rectally every 6 (six) hours as needed for nausea or vomiting. 04/04/14   Jacques Earthly, MD  senna-docusate (SENOKOT-S) 8.6-50 MG per tablet Take 1 tablet by mouth daily.    Historical Provider, MD   BP 201/134 mmHg  Pulse 113  Temp(Src) 98.6 F (37 C)  Resp 16  SpO2 97%  LMP 03/24/2014 Physical Exam  Constitutional: Shirley [name] appears well-developed and  well-nourished. No distress.  HENT:  Head: Normocephalic.  Eyes: Conjunctivae are normal.  Neck: Neck supple.  Cardiovascular: Normal rate, regular rhythm and normal heart sounds.   Pulmonary/Chest: Effort normal and breath sounds normal. No respiratory distress. Shirley [name] has no wheezes. Shirley [name] has no rales.  Abdominal: Soft. Bowel sounds are normal. Shirley [name] exhibits no distension. There is tenderness. There is no rebound.  Left mid abdominal tenderness  Musculoskeletal: Shirley [name] exhibits no edema.  Neurological: Shirley [name] is alert.  Skin: Skin is warm and dry.  Psychiatric: Shirley [name] has a normal mood and affect. Her behavior is normal.  Nursing note and vitals reviewed.   ED Course  Procedures (including critical care time) Labs Review Labs Reviewed  CBC WITH DIFFERENTIAL - Abnormal; Notable for the following:    WBC 14.2 (*)    RBC 5.29 (*)    Hemoglobin 11.5 (*)    HCT 35.9 (*)    MCV 67.9 (*)    MCH 21.7 (*)    RDW 28.1 (*)    Platelets 538 (*)    Neutro Abs 10.2 (*)  All other components within normal limits  COMPREHENSIVE METABOLIC PANEL - Abnormal; Notable for the following:    Glucose, Bld 237 (*)    Total Protein 8.9 (*)    GFR calc non Af Amer 90 (*)    All other components within normal limits  URINALYSIS, ROUTINE W REFLEX MICROSCOPIC - Abnormal; Notable for the following:    APPearance CLOUDY (*)    Glucose, UA 500 (*)    Hgb urine dipstick LARGE (*)    Protein, ur 30 (*)    All other components within normal limits  URINE MICROSCOPIC-ADD ON - Abnormal; Notable for the following:    Squamous Epithelial / LPF MANY (*)    All other components within normal limits  LIPASE, BLOOD  PREGNANCY, URINE    Imaging Review Dg Abd Acute W/chest  05/05/2014   CLINICAL DATA:  Acute onset of vomiting and lower abdominal pain for 24 hours. Initial encounter.  EXAM: ACUTE ABDOMEN SERIES (ABDOMEN 2 VIEW & CHEST 1 VIEW)  COMPARISON:  Chest radiograph performed 04/23/2014  FINDINGS: The lungs are  well-aerated. Mild vascular congestion is noted. There is no evidence of focal opacification, pleural effusion or pneumothorax. The cardiomediastinal silhouette is within normal limits.  The visualized bowel gas pattern is unremarkable. Scattered stool and air are seen within the colon; there is no evidence of small bowel dilatation to suggest obstruction. No free intra-abdominal air is identified on the provided upright view.  No acute osseous abnormalities are seen; the sacroiliac joints are unremarkable in appearance.  IMPRESSION: 1. Unremarkable bowel gas pattern; no free intra-abdominal air seen. Small to moderate amount of stool noted in the colon. 2. Mild vascular congestion noted; lungs remain grossly clear.   Electronically Signed   By: Garald Balding M.D.   On: 05/05/2014 23:22     EKG Interpretation None      MDM   Final diagnoses:  Abdominal pain  Bilious vomiting with nausea    Patient with nausea, vomiting, abdominal pain recently diagnosed with gastroparesis, most likely due to her diabetes, after extensive workup in the hospital and just discharged 6 days ago. Patient is currently back with similar symptoms. Shirley [name] is actively vomiting, screaming, very anxious, tossing around the bed. Patient already received 100 g of fentanyl and 8 mg of Zofran IV. Shirley [name] continues to have symptoms. Her abdomen is soft on examination, no peritoneal signs, Shirley [name] is producing clear emesis in the back. Will try Phenergan and Dilaudid.  No improvement in her symptoms with phenergan and dilaudid. reglan 10mg  IV givan, with no improvement. Phenergan ordered. Pt continues to have pain, vomiting, tossinga roun in bed. Will try ativan and another dose of dilaudid.    12:00 AM. Pt is sleeping, arousable but appears to be sedated from medications. Can only stay awake for several sec. States her symptoms are better. Protecting airway. Pt signed out to NP Delena Bali, will monitor until more awake, if able tolerate POs,  dc home with phenergan. Already has reglan at home.      Renold Genta, PA-C 05/06/14 [place], MD 05/07/14 [phone]

## 2014-05-05 NOTE — ED Notes (Signed)
Pt complains of abdominal pain, nausea and vomiting for two hours

## 2014-05-06 NOTE — Discharge Instructions (Signed)

## 2014-05-06 NOTE — ED Provider Notes (Signed)
Patient woke able toeat and drink DC home with [place], NP 05/06/14 [place], MD 05/07/14 [phone]

## 2014-05-14 ENCOUNTER — Other Ambulatory Visit: Payer: Self-pay | Admitting: *Deleted

## 2014-05-14 NOTE — Telephone Encounter (Signed)
Needs insulin pens in both insulins Cough continues, would like more cough med Ran out of pain med Tuesday 1/19 Please advise asap

## 2014-05-16 MED ORDER — HYDROCOD POLST-CHLORPHEN POLST 10-8 MG/5ML PO LQCR: 5.0000 mL | Freq: Two times a day (BID) | ORAL | Status: DC | PRN

## 2014-05-16 MED ORDER — INSULIN ASPART 100 UNIT/ML ~~LOC~~ SOLN: SUBCUTANEOUS | Status: DC

## 2014-05-16 MED ORDER — INSULIN GLARGINE 100 UNIT/ML SOLOSTAR PEN: 50.0000 [IU] | PEN_INJECTOR | Freq: Every day | SUBCUTANEOUS | Status: DC

## 2014-05-18 NOTE — Addendum Note (Signed)
Addended by: Marcelino Duster on: 05/18/2014 05:02 PM   Modules accepted: Orders

## 2014-05-18 NOTE — Telephone Encounter (Addendum)
Contacted pt's pharmacy-rxs was phoned in, but they was unable to accept tussinex rx over the phone.  Pharmacy did state that MD could actually send rx electronically-although this is a controlled medication.  Pharmacy also wanted me to make pt aware that medicaid will not cover this medication.  Pt informed.. Will send rx request back to MD to send in electronically.Regenia Skeeter, Darlene Cassady1/25/20165:00 PM

## 2014-05-20 NOTE — Addendum Note (Signed)
Addended by: Luan Moore on: 05/20/2014 08:29 AM   Modules accepted: Orders

## 2014-05-20 NOTE — Telephone Encounter (Signed)
The system will not allow me to send the rx electronically. I will plan on printing a prescription in clinic tomorrow unless there is some way to do it electronically that I am not aware of.

## 2014-05-25 ENCOUNTER — Telehealth: Payer: Self-pay | Admitting: Internal Medicine

## 2014-05-25 MED ORDER — HYDROCOD POLST-CHLORPHEN POLST 10-8 MG/5ML PO LQCR
5.0000 mL | Freq: Two times a day (BID) | ORAL | Status: DC | PRN
Start: 2014-05-25 — End: 2014-10-15

## 2014-05-25 NOTE — Telephone Encounter (Signed)
Call to patient to confirm appointment for 05/26/14 at 2:15. Patient Confirmed.

## 2014-05-26 ENCOUNTER — Ambulatory Visit: Payer: Medicaid Other | Admitting: Internal Medicine

## 2014-05-27 ENCOUNTER — Ambulatory Visit: Payer: Medicaid Other | Admitting: Internal Medicine

## 2014-05-27 ENCOUNTER — Encounter: Payer: Self-pay | Admitting: Internal Medicine

## 2014-06-20 IMAGING — CT CT HEAD W/O CM
2 series · 16 of 30 positions shown, 20 images · non-contrast
Comparison: None.

CLINICAL DATA: Left-sided ear pain.

CT HEAD WITHOUT CONTRAST
TECHNIQUE: Contiguous axial images were obtained from the base of
the skull through the vertex without contrast.

[Series 2: head w/o · axial · non-contrast · 0.43mm/px · z∈[+304,+430]mm · 13 of 31 slices shown, 17 images]
[im 3/31  brain]
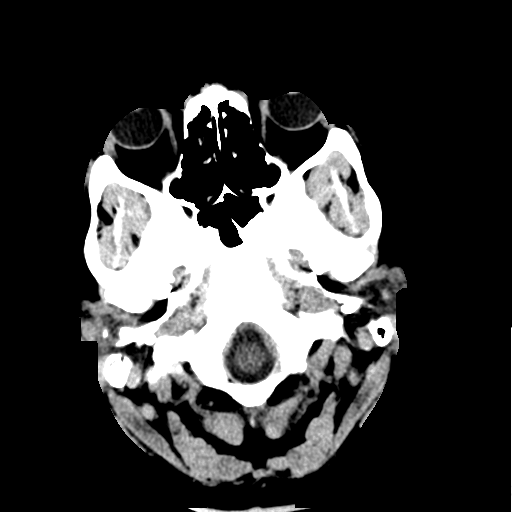
[im 3/31  bone]
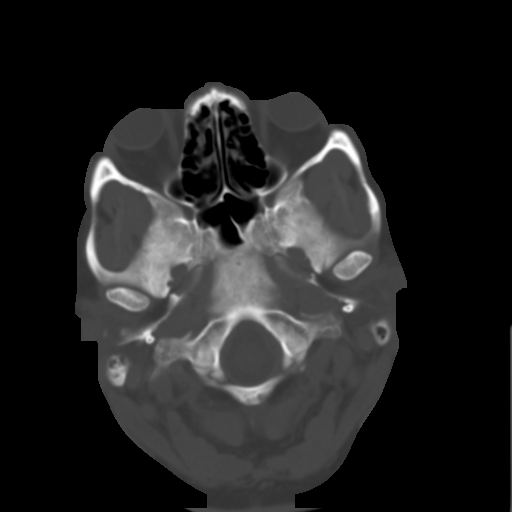
[im 5/31  brain]
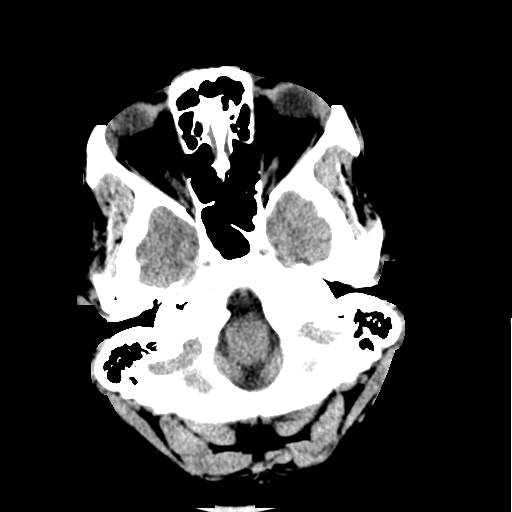
[im 7/31  brain]
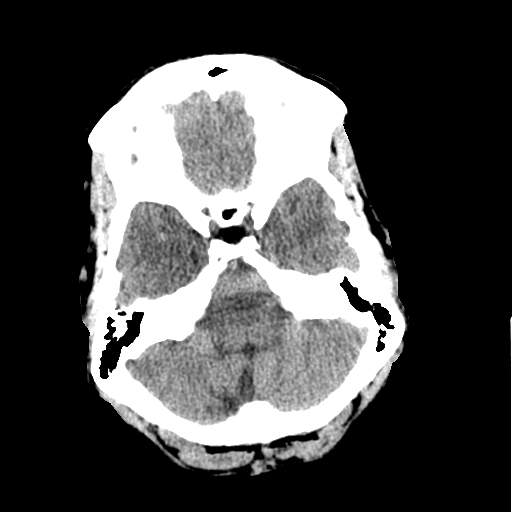
[im 9/31  brain]
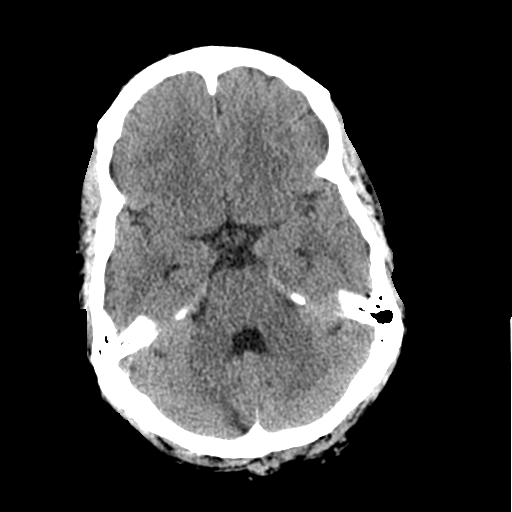
[im 11/31  brain]
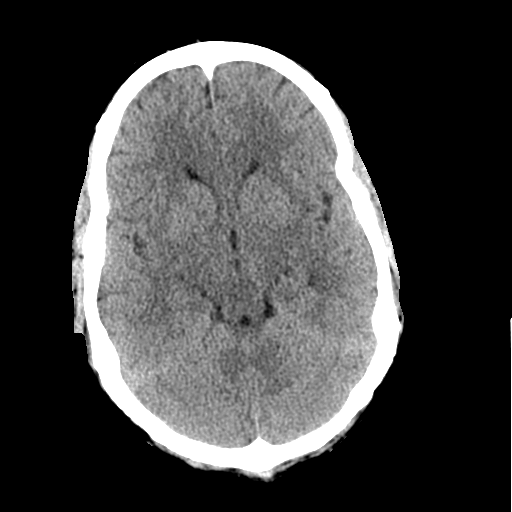
[im 11/31  bone]
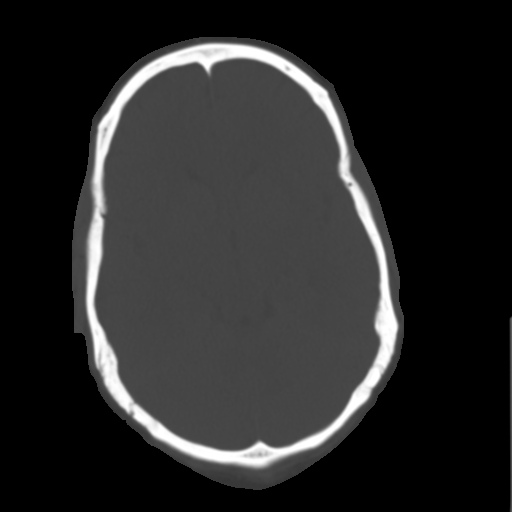
[im 13/31  brain]
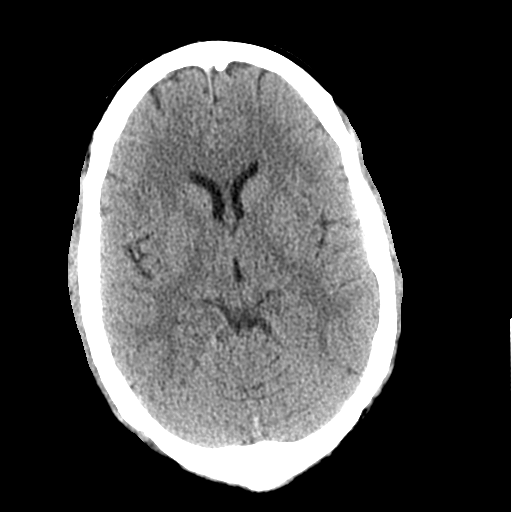
[im 16/31  brain]
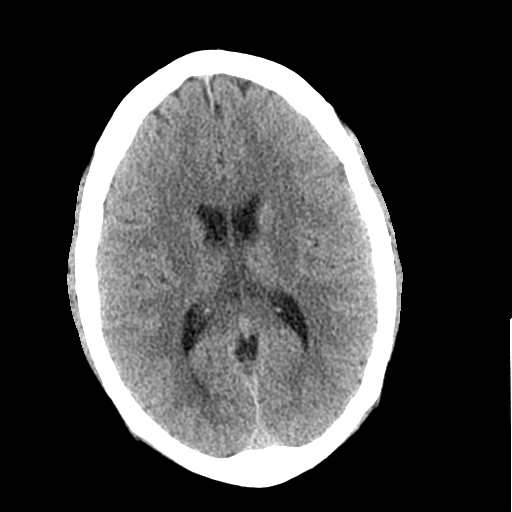
[im 18/31  brain]
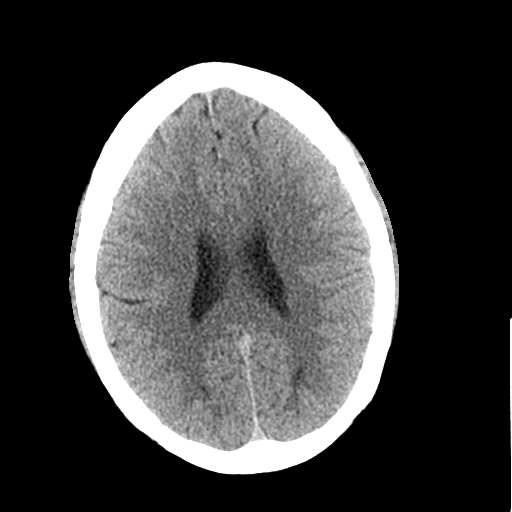
[im 20/31  brain]
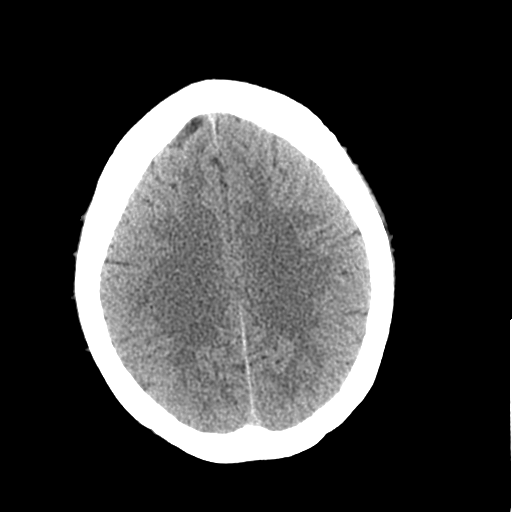
[im 20/31  bone]
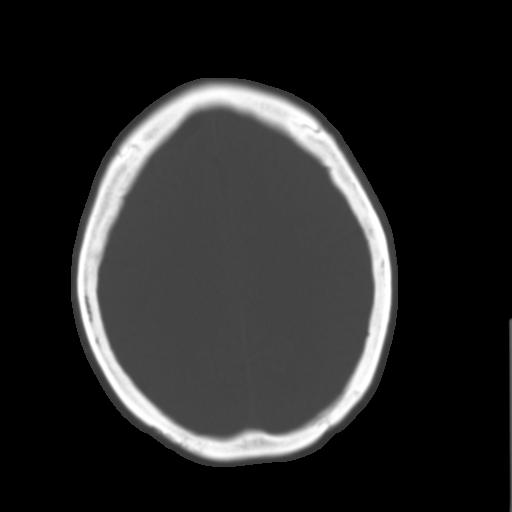
[im 22/31  brain]
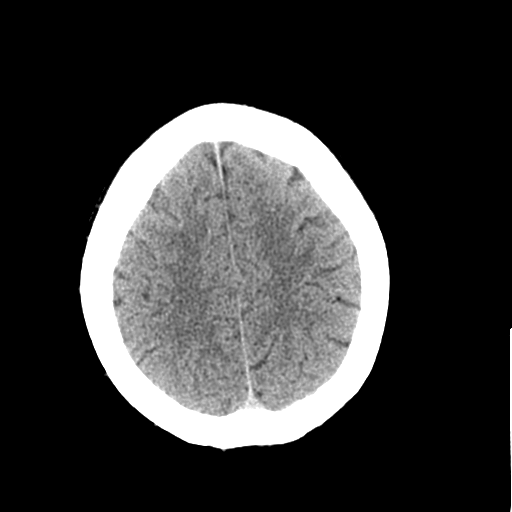
[im 24/31  brain]
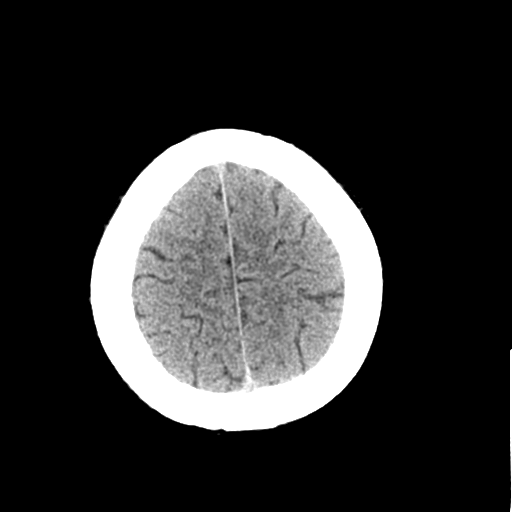
[im 26/31  brain]
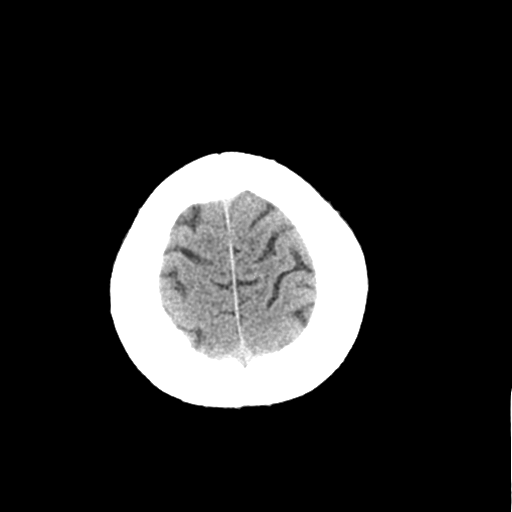
[im 28/31  brain]
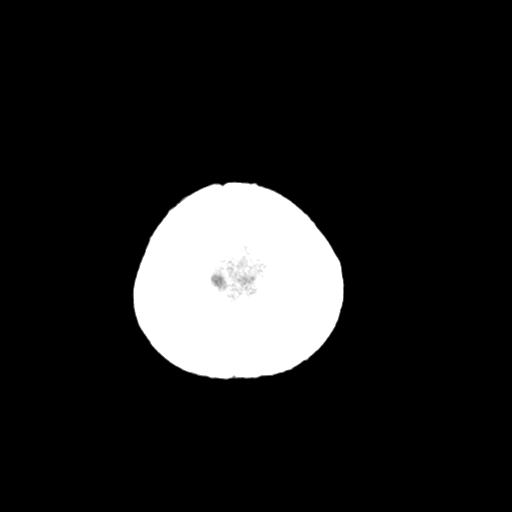
[im 28/31  bone]
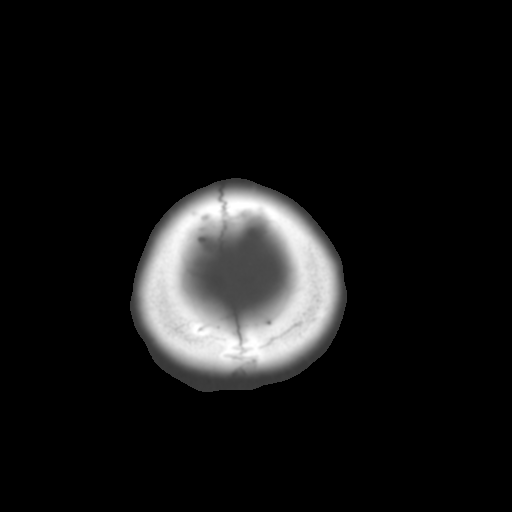

[Series 3: bone windows · axial · 0.43mm/px · z∈[+304,+344]mm · 3 of 31 slices shown]
[im 3/31  bone]
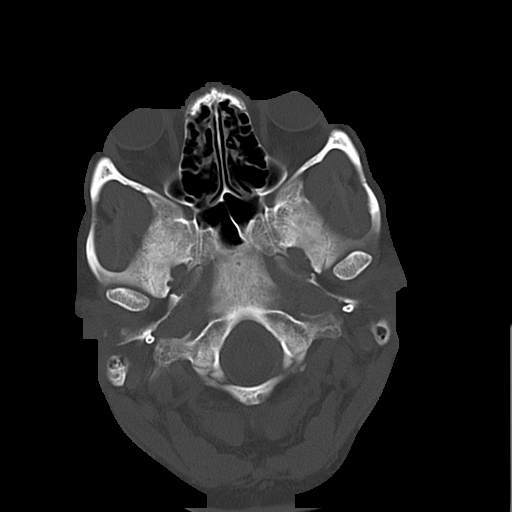
[im 7/31  bone]
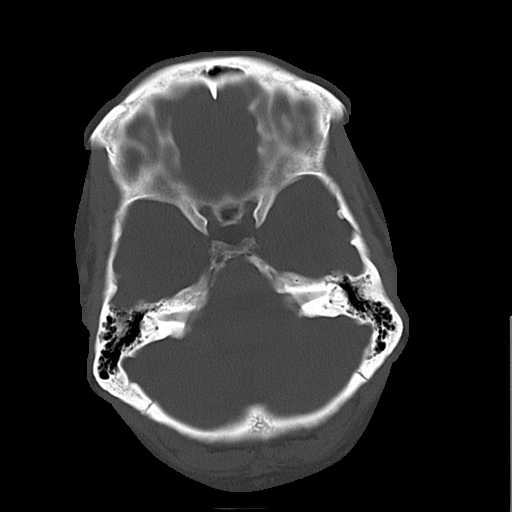
[im 11/31  bone]
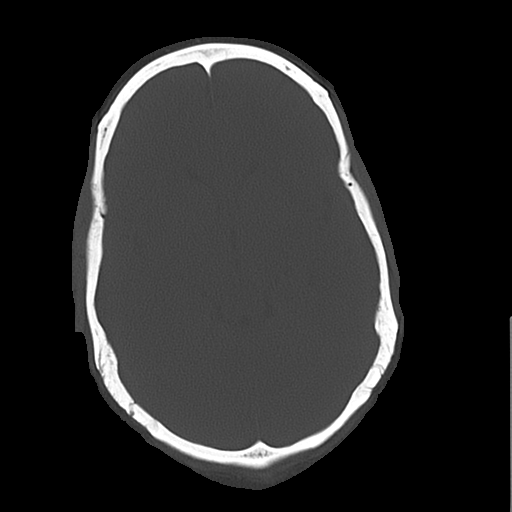

[16 of 30 positions shown; findings below may reference images not displayed]

FINDINGS: The brain demonstrates no evidence of hemorrhage,
infarction, edema, mass effect, extra-axial fluid collection,
hydrocephalus or mass lesion.  The skull is unremarkable.  Mastoid
air cells and visualized paranasal sinuses are normally aerated.
Although not a dedicated temporal bone study, the current CT is
felt adequate to exclude acute mastoiditis.  No abnormalities seen
involving the auditory canal.
IMPRESSION: Normal head CT.  No evidence of left-sided mastoiditis.

## 2014-06-28 IMAGING — CT CT ABD-PELV W/ CM
1 of 2 series · 14 of 32 positions shown, 18 images · IV contrast (agent unspecified)
Comparison: Abdominal radiographs obtained today, 11/23/2011; most
recent CT scan of the abdomen and pelvis 03/04/2011.

CLINICAL DATA: LLQ abdominal pain, emesis

CT ABDOMEN AND PELVIS WITH CONTRAST
TECHNIQUE: Multidetector CT imaging of the abdomen and pelvis was
performed following the standard protocol during bolus
administration of intravenous contrast.
Contrast:  100 ml Rmnipaque-2XX administered intravenously.

[Series 2: abd/pel with · axial · 0.86mm/px · z∈[+1215,+1670]mm · 14 of 101 slices shown, 18 images]
[im 5/101  soft-tissue]
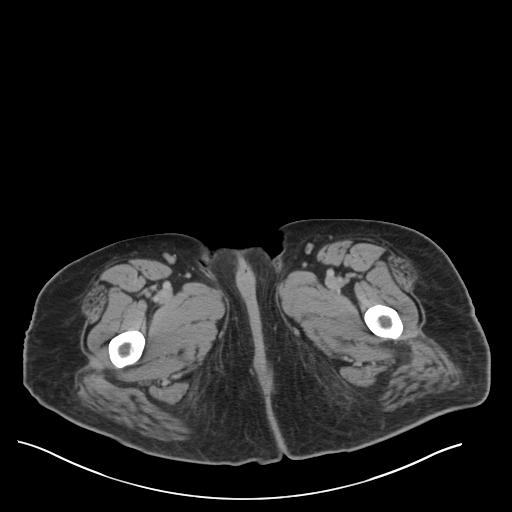
[im 5/101  bone]
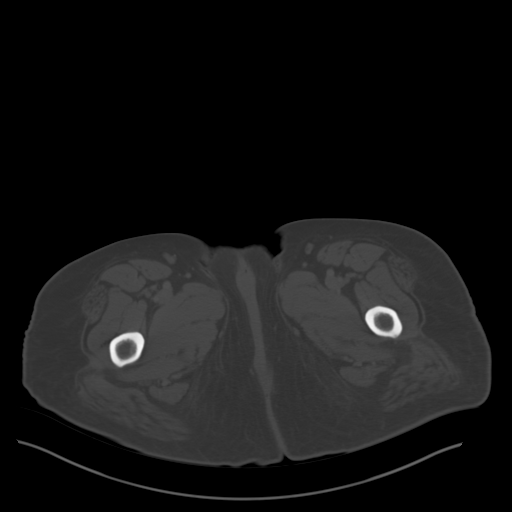
[im 13/101  soft-tissue]
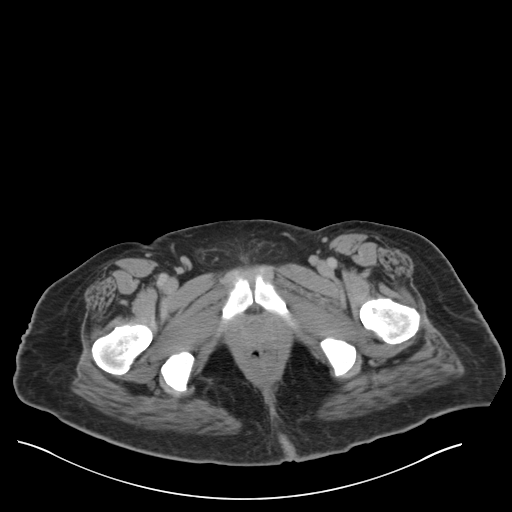
[im 21/101  soft-tissue]
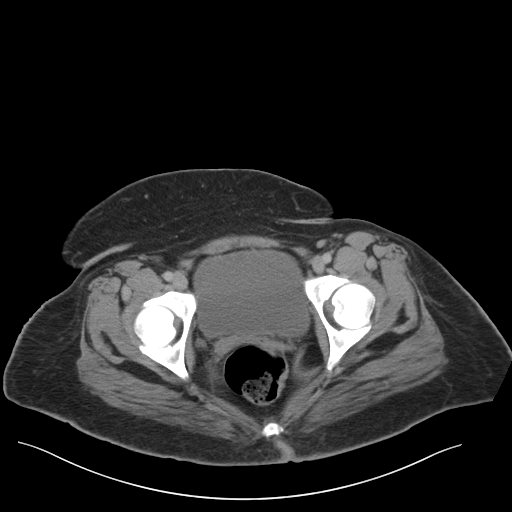
[im 30/101  soft-tissue]
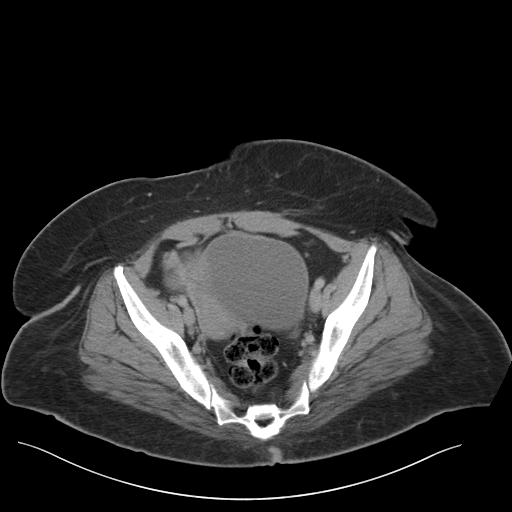
[im 38/101  soft-tissue]
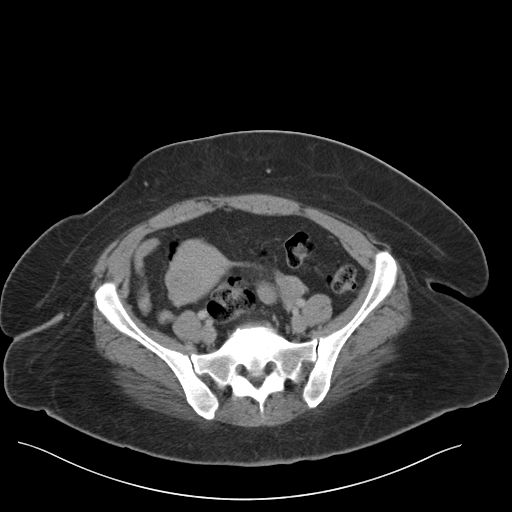
[im 46/101  soft-tissue]
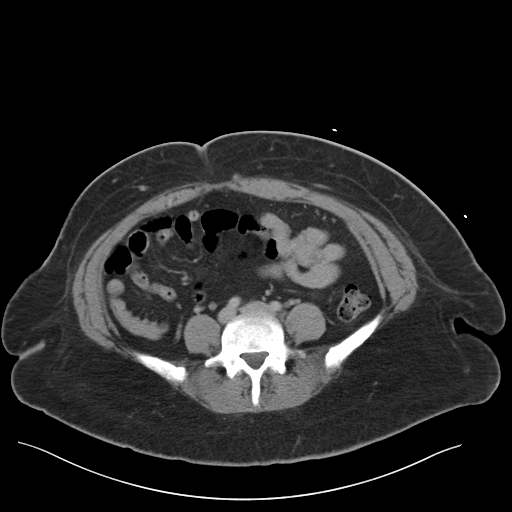
[im 55/101  soft-tissue]
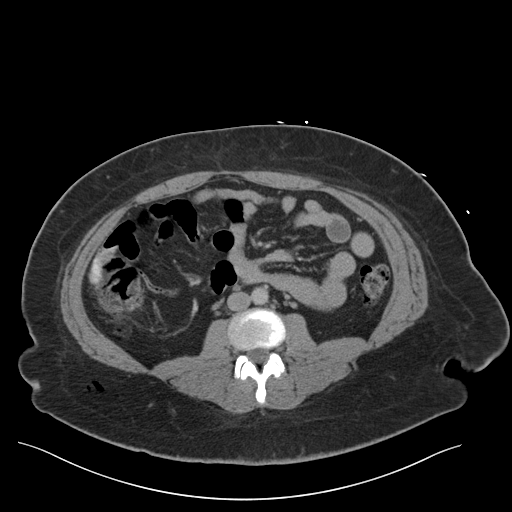
[im 63/101  soft-tissue]
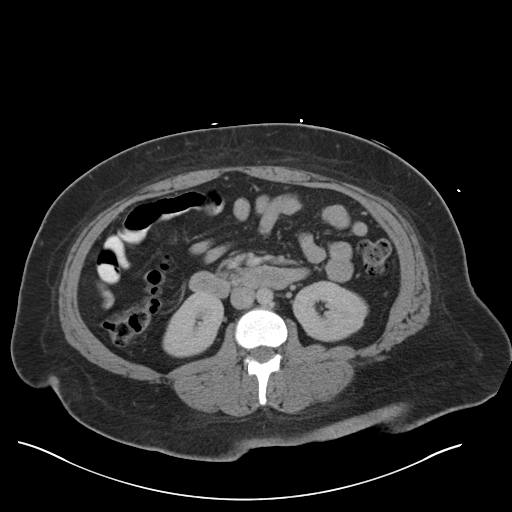
[im 71/101  soft-tissue]
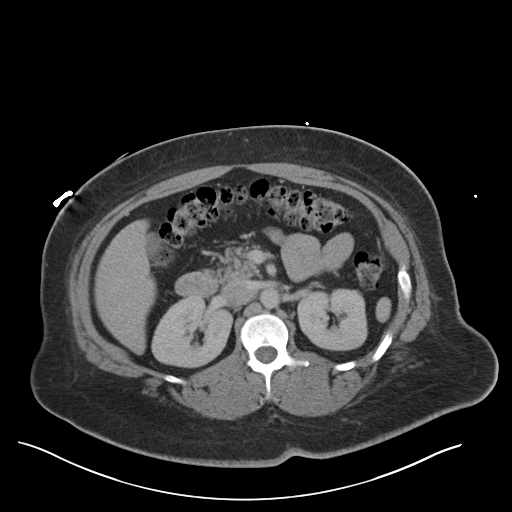
[im 71/101  bone]
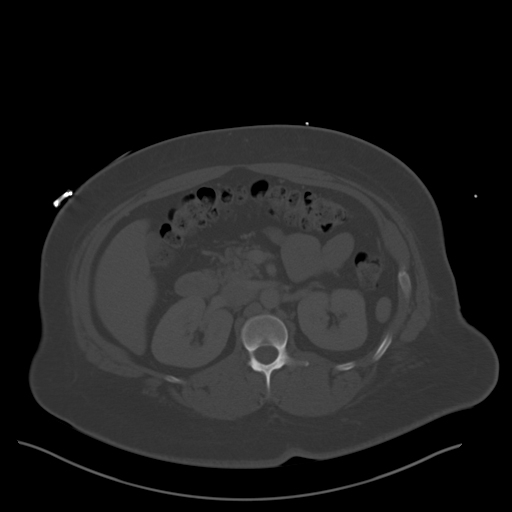
[im 80/101  soft-tissue]
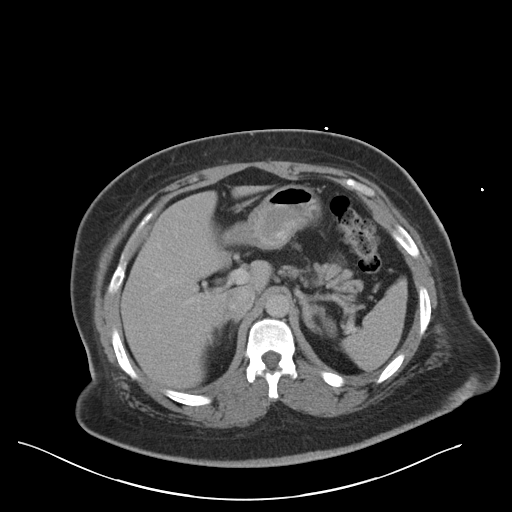
[im 84/101  lung]
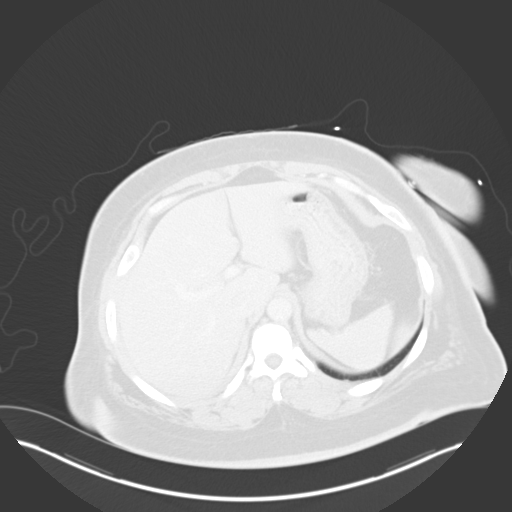
[im 88/101  soft-tissue]
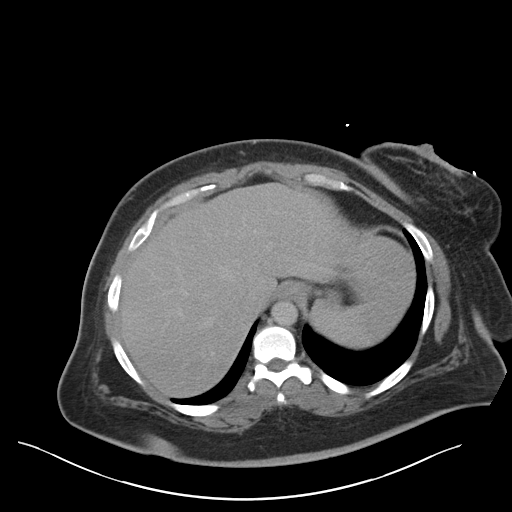
[im 88/101  lung]
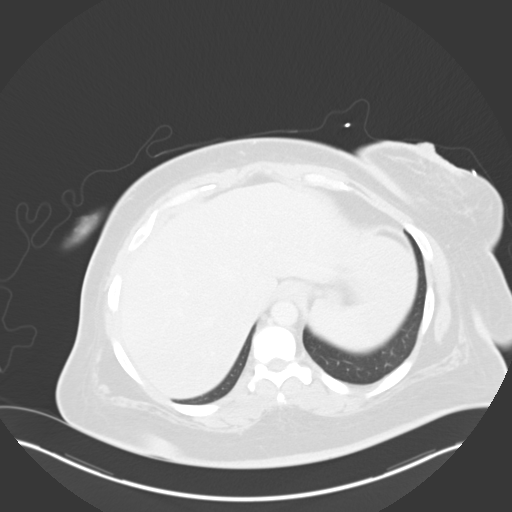
[im 92/101  lung]
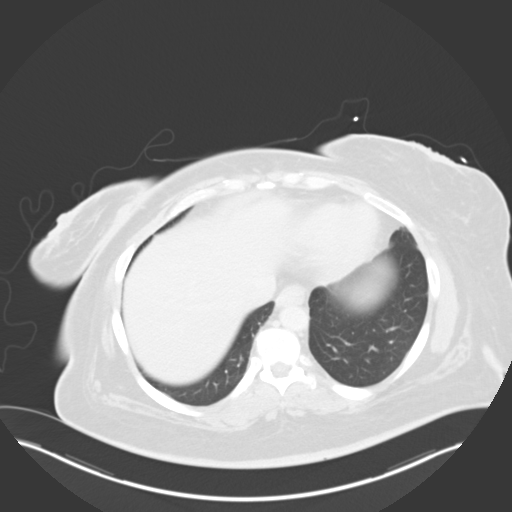
[im 96/101  soft-tissue]
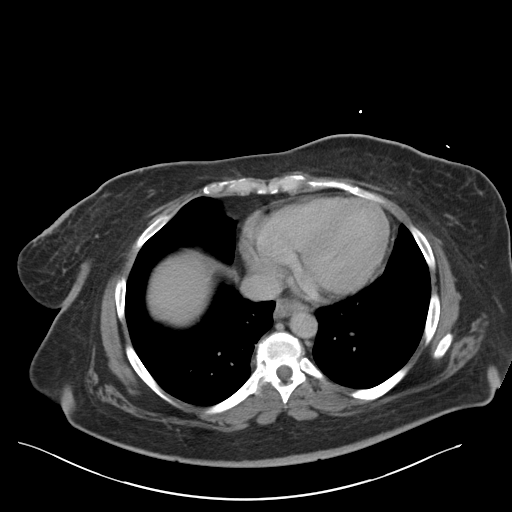
[im 96/101  lung]
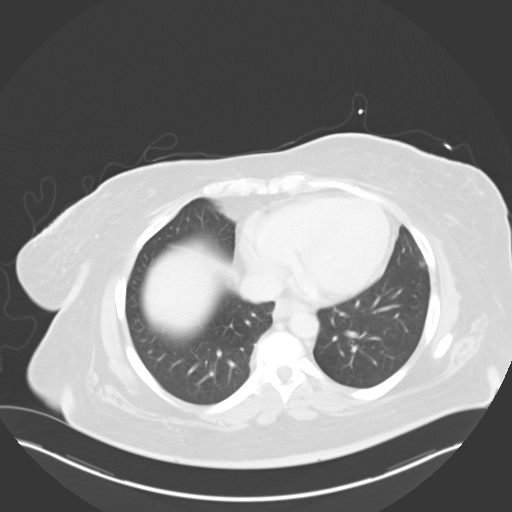

[14 of 32 positions shown; findings below may reference images not displayed]

FINDINGS: Lower Chest: Minimal atelectasis in the lingula, and left lower
lobe.  Mild respiratory motion limits evaluation for small
pulmonary nodules.  Otherwise, the lower thorax is unremarkable.

Abdomen: The lower esophagus, stomach and duodenum are unremarkable
appearance.  The spleen, pancreas and adrenal glands, liver, and
kidneys are unremarkable in appearance.  Specifically, no focal
solid lesion, hydronephrosis, or other abnormality. Gallbladder is
unremarkable. No intra or extrahepatic biliary ductal dilatation.

Normal-caliber large and small bowel throughout the abdomen.
Normal appendix identified in the right lower quadrant.  No
significant colonic diverticular disease.  No ascites, or
suspicious mesenteric upper abdominal or retroperitoneal
adenopathy.

Pelvis: The bladder is distended with urine. Query a 2.5 cm
enhancing lesion at the posterior uterine fundus consistent with a
uterine fibroid.  Unremarkable appearance of the adnexa and
reproductive age female.

Bones: Nonspecific 1 cm soft tissue density in the outer aspect of
the mid right breast. Several locules of gas are noted in the
subcutaneous fat of the right greater than left lower back.  This
is of uncertain etiology.  There is no associated fluid collection,
or significant stranding.  No acute fracture or aggressive
appearing lytic or blastic osseous lesion. Focal degenerative disc
disease at L4-L5 and L5-S1 with posterior disc bulge at L5-S1.

Vascular: No significant atherosclerotic vascular disease.
Incidental note is made of a circumaortic left renal vein.
IMPRESSION: 1.  No acute intra-abdominal or pelvic process to explain the
patient's clinical symptoms of left lower quadrant pain, and
emesis.

2.   Nonspecific 1 cm soft tissue density in the outer aspect of
the mid right breast.  This is a nonspecific finding by CT scan.
Recommend clinical correlation with breast examination.  If a
palpable nodule is present, consider further evaluation with
mammography.

3. Benign appearing gas in the subcutaneous fat of the right
greater than left lower back.  There is no associated fluid
collection, and no significant adjacent stranding or inflammatory
change.  Query recent attempted intramuscular injection.

4.  Probable fibroid uterus

## 2014-06-28 IMAGING — CR DG ABDOMEN ACUTE W/ 1V CHEST
3 series · 3 of 3 positions shown · non-contrast
Comparison: 03/03/2011

CLINICAL DATA: Abdominal pain, nausea, vomiting, shortness of
breath.

ACUTE ABDOMEN SERIES (ABDOMEN 2 VIEW & CHEST 1 VIEW)

[w chest pa]
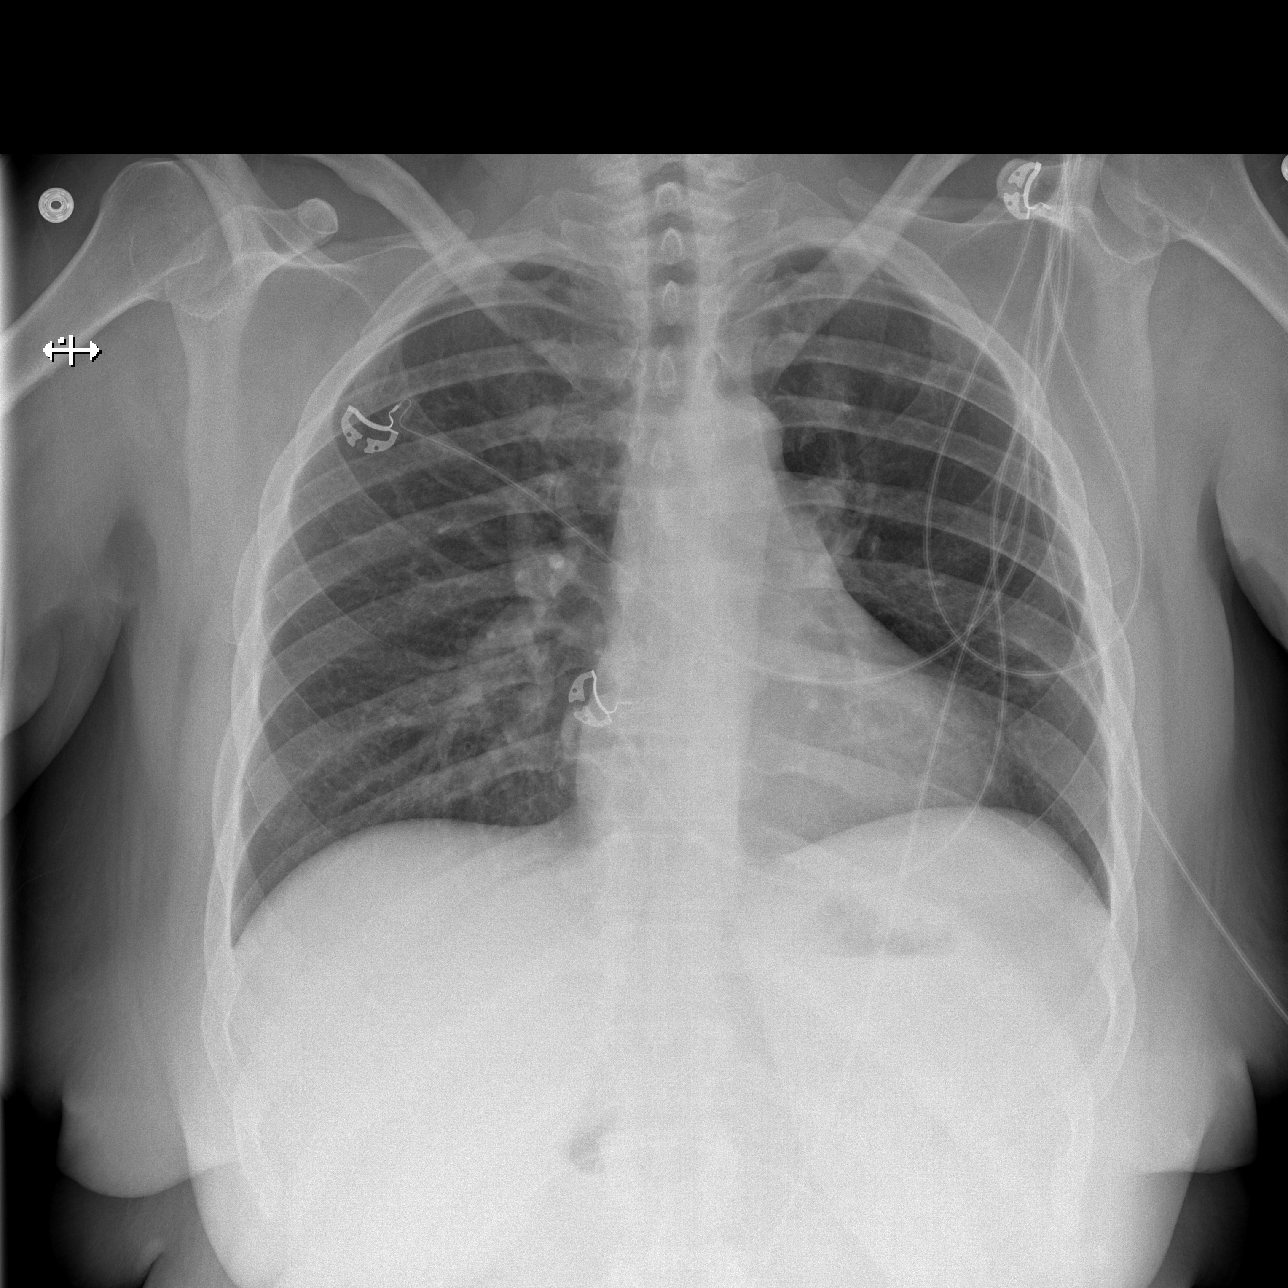

[w abdomen upright]
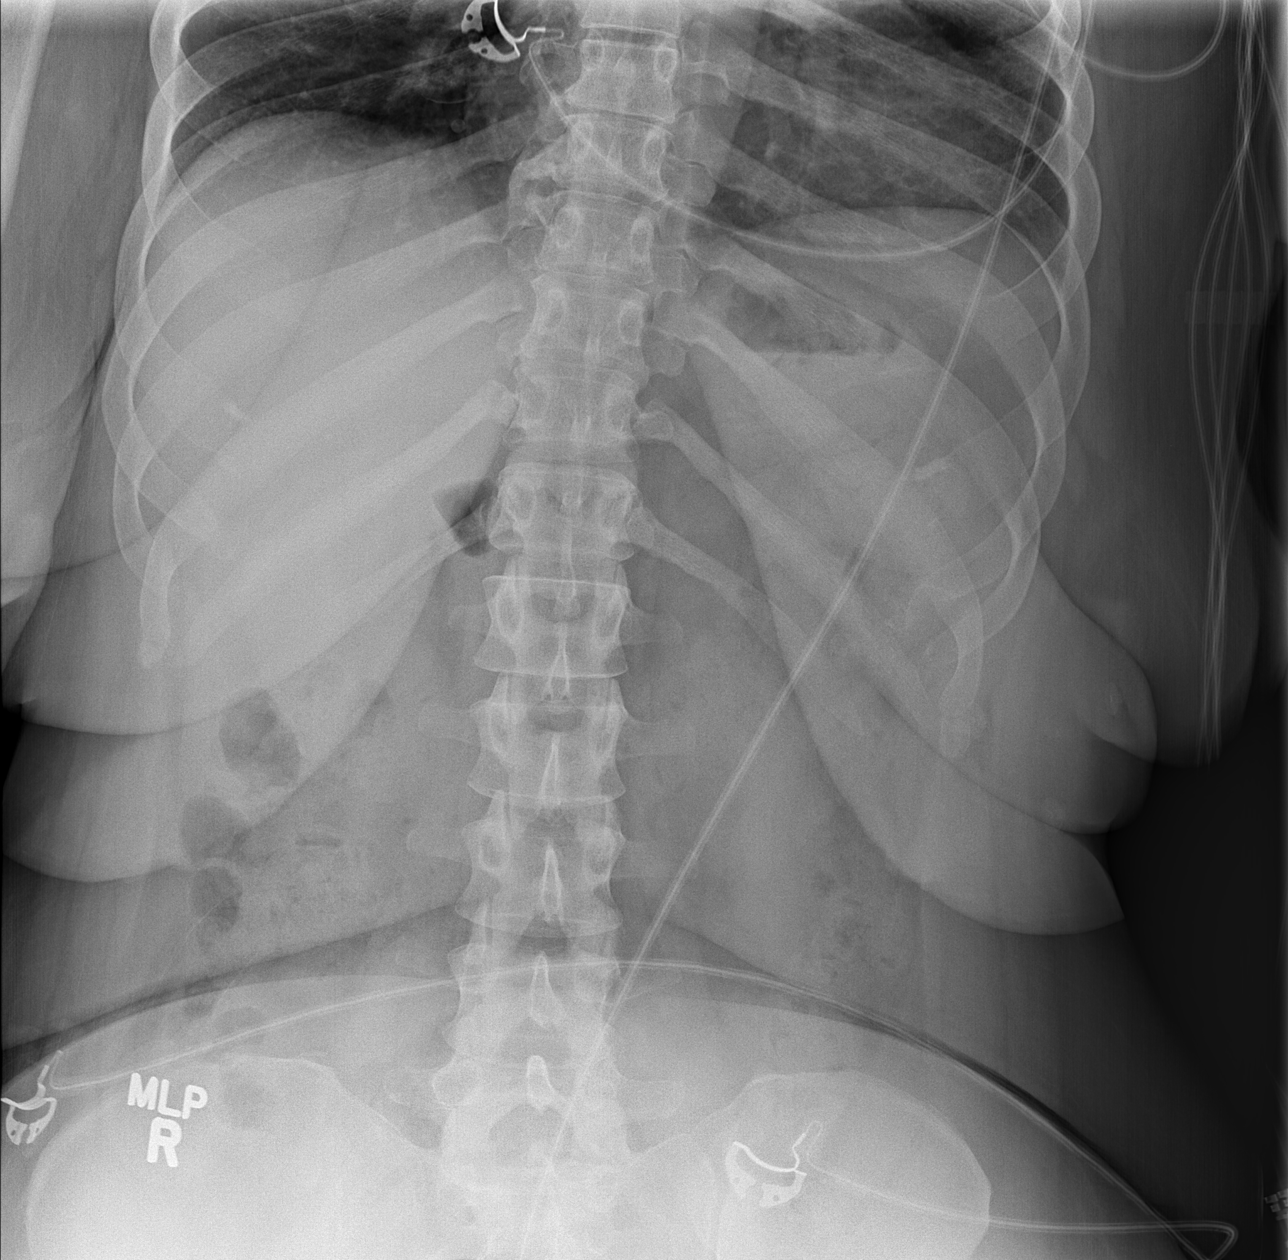

[t abdomen supine]
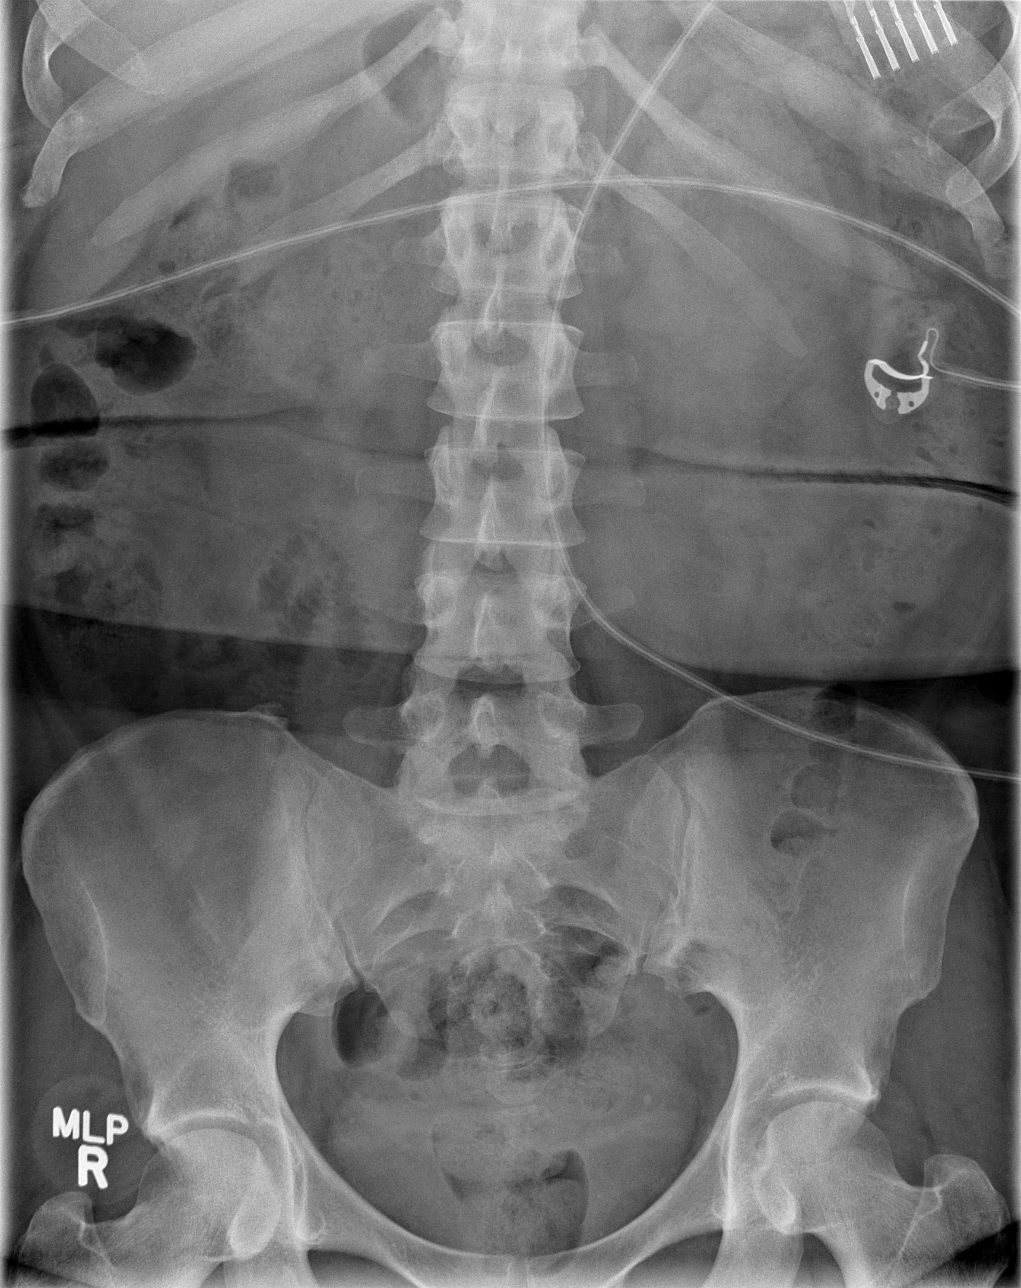

[3 of 3 positions shown; findings below may reference images not displayed]

FINDINGS: The bowel gas pattern is normal.  There is no evidence of
free intraperitoneal air.  No suspicious radio-opaque calculi or
other significant radiographic abnormality is seen. Heart size and
mediastinal contours are within normal limits.  Both lungs are
clear.
IMPRESSION: No acute findings.

## 2014-06-30 ENCOUNTER — Emergency Department (HOSPITAL_COMMUNITY)
Admission: EM | Admit: 2014-06-30 | Discharge: 2014-06-30 | Disposition: A | Discharge status: Home / Self Care | Payer: Medicaid Other | Attending: Emergency Medicine | Admitting: Emergency Medicine

## 2014-06-30 ENCOUNTER — Encounter (HOSPITAL_COMMUNITY): Payer: Self-pay | Admitting: *Deleted

## 2014-06-30 DIAGNOSIS — G8929 Other chronic pain: Secondary | ICD-10-CM | POA: Diagnosis not present

## 2014-06-30 DIAGNOSIS — I2510 Atherosclerotic heart disease of native coronary artery without angina pectoris: Secondary | ICD-10-CM | POA: Diagnosis not present

## 2014-06-30 DIAGNOSIS — E785 Hyperlipidemia, unspecified: Secondary | ICD-10-CM | POA: Insufficient documentation

## 2014-06-30 DIAGNOSIS — Z79899 Other long term (current) drug therapy: Secondary | ICD-10-CM | POA: Insufficient documentation

## 2014-06-30 DIAGNOSIS — I252 Old myocardial infarction: Secondary | ICD-10-CM | POA: Diagnosis not present

## 2014-06-30 DIAGNOSIS — J45909 Unspecified asthma, uncomplicated: Secondary | ICD-10-CM | POA: Insufficient documentation

## 2014-06-30 DIAGNOSIS — Z872 Personal history of diseases of the skin and subcutaneous tissue: Secondary | ICD-10-CM | POA: Insufficient documentation

## 2014-06-30 DIAGNOSIS — G43909 Migraine, unspecified, not intractable, without status migrainosus: Secondary | ICD-10-CM | POA: Diagnosis not present

## 2014-06-30 DIAGNOSIS — D509 Iron deficiency anemia, unspecified: Secondary | ICD-10-CM | POA: Diagnosis not present

## 2014-06-30 DIAGNOSIS — E669 Obesity, unspecified: Secondary | ICD-10-CM | POA: Insufficient documentation

## 2014-06-30 DIAGNOSIS — K219 Gastro-esophageal reflux disease without esophagitis: Secondary | ICD-10-CM | POA: Insufficient documentation

## 2014-06-30 DIAGNOSIS — R112 Nausea with vomiting, unspecified: Secondary | ICD-10-CM | POA: Diagnosis not present

## 2014-06-30 DIAGNOSIS — Z7951 Long term (current) use of inhaled steroids: Secondary | ICD-10-CM | POA: Insufficient documentation

## 2014-06-30 DIAGNOSIS — Z8742 Personal history of other diseases of the female genital tract: Secondary | ICD-10-CM | POA: Insufficient documentation

## 2014-06-30 DIAGNOSIS — Z8619 Personal history of other infectious and parasitic diseases: Secondary | ICD-10-CM | POA: Insufficient documentation

## 2014-06-30 DIAGNOSIS — E119 Type 2 diabetes mellitus without complications: Secondary | ICD-10-CM | POA: Diagnosis not present

## 2014-06-30 DIAGNOSIS — Z87442 Personal history of urinary calculi: Secondary | ICD-10-CM | POA: Diagnosis not present

## 2014-06-30 DIAGNOSIS — Z794 Long term (current) use of insulin: Secondary | ICD-10-CM | POA: Insufficient documentation

## 2014-06-30 DIAGNOSIS — R1032 Left lower quadrant pain: Secondary | ICD-10-CM | POA: Diagnosis present

## 2014-06-30 DIAGNOSIS — R109 Unspecified abdominal pain: Secondary | ICD-10-CM

## 2014-06-30 DIAGNOSIS — Z87891 Personal history of nicotine dependence: Secondary | ICD-10-CM | POA: Insufficient documentation

## 2014-06-30 DIAGNOSIS — F419 Anxiety disorder, unspecified: Secondary | ICD-10-CM | POA: Insufficient documentation

## 2014-06-30 DIAGNOSIS — Z8701 Personal history of pneumonia (recurrent): Secondary | ICD-10-CM | POA: Diagnosis not present

## 2014-06-30 DIAGNOSIS — Z3202 Encounter for pregnancy test, result negative: Secondary | ICD-10-CM | POA: Diagnosis not present

## 2014-06-30 DIAGNOSIS — I10 Essential (primary) hypertension: Secondary | ICD-10-CM | POA: Insufficient documentation

## 2014-06-30 LAB — COMPREHENSIVE METABOLIC PANEL
ALBUMIN: 3.3 g/dL — AB (ref 3.5–5.2)
ALT: 7 U/L (ref 0–35)
ANION GAP: 7 (ref 5–15)
AST: 17 U/L (ref 0–37)
Alkaline Phosphatase: 56 U/L (ref 39–117)
BILIRUBIN TOTAL: 0.5 mg/dL (ref 0.3–1.2)
BUN: 10 mg/dL (ref 6–23)
CHLORIDE: 106 mmol/L (ref 96–112)
CO2: 25 mmol/L (ref 19–32)
CREATININE: 0.77 mg/dL (ref 0.50–1.10)
Calcium: 9 mg/dL (ref 8.4–10.5)
GFR calc Af Amer: >90 mL/min (ref >90)
GFR calc non Af Amer: >90 mL/min (ref >90)
Glucose, Bld: 224 mg/dL — ABNORMAL HIGH (ref 70–99)
Potassium: 3.4 mmol/L — ABNORMAL LOW (ref 3.5–5.1)
SODIUM: 138 mmol/L (ref 135–145)
Total Protein: 7.4 g/dL (ref 6.0–8.3)

## 2014-06-30 LAB — CBC WITH DIFFERENTIAL/PLATELET
BASOS ABS: 0 10*3/uL (ref 0.0–0.1)
Basophils Relative: 0 % (ref 0–1)
Eosinophils Absolute: 0.2 10*3/uL (ref 0.0–0.7)
Eosinophils Relative: 2 % (ref 0–5)
HCT: 33.8 % — ABNORMAL LOW (ref 36.0–46.0)
Hemoglobin: 11.5 g/dL — ABNORMAL LOW (ref 12.0–15.0)
LYMPHS PCT: 18 % (ref 12–46)
Lymphs Abs: 2 10*3/uL (ref 0.7–4.0)
MCH: 25.6 pg — ABNORMAL LOW (ref 26.0–34.0)
MCHC: 34 g/dL (ref 30.0–36.0)
MCV: 75.3 fL — ABNORMAL LOW (ref 78.0–100.0)
MONOS PCT: 6 % (ref 3–12)
Monocytes Absolute: 0.7 10*3/uL (ref 0.1–1.0)
NEUTROS PCT: 74 % (ref 43–77)
Neutro Abs: 8.4 10*3/uL — ABNORMAL HIGH (ref 1.7–7.7)
PLATELETS: 353 10*3/uL (ref 150–400)
RBC: 4.49 MIL/uL (ref 3.87–5.11)
RDW: 21.2 % — ABNORMAL HIGH (ref 11.5–15.5)
WBC: 11.3 10*3/uL — AB (ref 4.0–10.5)

## 2014-06-30 LAB — CBG MONITORING, ED
Glucose-Capillary: 193 mg/dL — ABNORMAL HIGH (ref 70–99)
Glucose-Capillary: 216 mg/dL — ABNORMAL HIGH (ref 70–99)

## 2014-06-30 LAB — LIPASE, BLOOD: LIPASE: 20 U/L (ref 11–59)

## 2014-06-30 LAB — POC URINE PREG, ED: Preg Test, Ur: NEGATIVE

## 2014-06-30 MED ORDER — PROMETHAZINE HCL 25 MG RE SUPP: 25.0000 mg | Freq: Four times a day (QID) | RECTAL | Status: DC | PRN

## 2014-06-30 MED ORDER — HYDROMORPHONE HCL 1 MG/ML IJ SOLN
1.0000 mg | Freq: Once | INTRAMUSCULAR | Status: AC
  Administered 2014-06-30: 1 mg via INTRAVENOUS
  Filled 2014-06-30: qty 1

## 2014-06-30 MED ORDER — OMEPRAZOLE 20 MG PO CPDR: 20.0000 mg | DELAYED_RELEASE_CAPSULE | Freq: Every day | ORAL | Status: DC

## 2014-06-30 MED ORDER — GI COCKTAIL ~~LOC~~
30.0000 mL | Freq: Once | ORAL | Status: AC
  Administered 2014-06-30: 30 mL via ORAL
  Filled 2014-06-30: qty 30

## 2014-06-30 MED ORDER — PROMETHAZINE HCL 25 MG/ML IJ SOLN
12.5000 mg | Freq: Once | INTRAMUSCULAR | Status: AC
  Administered 2014-06-30: 12.5 mg via INTRAVENOUS
  Filled 2014-06-30: qty 1

## 2014-06-30 MED ORDER — POTASSIUM CHLORIDE CRYS ER 20 MEQ PO TBCR
40.0000 meq | EXTENDED_RELEASE_TABLET | Freq: Once | ORAL | Status: AC
  Administered 2014-06-30: 40 meq via ORAL
  Filled 2014-06-30: qty 2

## 2014-06-30 MED ORDER — SODIUM CHLORIDE 0.9 % IV BOLUS (SEPSIS)
500.0000 mL | Freq: Once | INTRAVENOUS | Status: AC
  Administered 2014-06-30: 500 mL via INTRAVENOUS

## 2014-06-30 NOTE — ED Notes (Signed)
Patient verbalizes understanding of discharge instructions, prescription medications, home care and follow up care. Patient ambulatory out of department at this time. 

## 2014-06-30 NOTE — ED Notes (Signed)
Bed: WA09 Expected date:  Expected time:  Means of arrival:  Comments: Ems- hx abd ca, n/v

## 2014-06-30 NOTE — ED Notes (Addendum)
Pt in from court, n/v, abd pain, x4 days. Unable to take pain or nausea meds. Dx stomach Ca 2 years ago. Was unable to physically tolerate radiation and chemo so refused treatment for last 2 years due to side effects. Now c/o LLQ pain. Emesis x4 today. Received 8mg  zofran, 180mcg fentanyl from ems. 18g L AC IV PTA. Also unable to take insulin at home, ems cbg 264.

## 2014-06-30 NOTE — ED Notes (Signed)
Patient  Is aware of urine sample, she will call when she can go and cgb is 193

## 2014-06-30 NOTE — ED Notes (Signed)
DECLINED IN AND OUT. PT VOIDED

## 2014-06-30 NOTE — ED Notes (Signed)
Patient still is unable to urinate at this time

## 2014-06-30 NOTE — ED Provider Notes (Signed)
CSN: [id number]     Arrival date & time 06/30/14  1326 History   First MD Initiated Contact with Patient 06/30/14 1505     Chief Complaint  Patient presents with  . Abdominal Pain  . Nausea  . Emesis     (Consider location/radiation/quality/duration/timing/severity/associated sxs/prior Treatment) HPI  Pt is a 39yo female with hx of chronic LLQ abdominal pain for 2 years with associated nausea and vomiting, diagnosed by Dr. Benson Norway, gastroenterology, with gastroparesis based on clinical findings, although multiple imaging modalities have yet to find cause of pt's chronic symptoms, hx of polysubstance abuse, GERD, gastritis, Hep B, anxiety, depression, presenting to ED with c/o exacerbation of her chronic LLQ abdomina pain, gradually worsening over the last 3 days, assocaited nausea and vomiting.  Pt reports having 10 episodes of emesis today including a "tiny amount" of spit up as pt was with EMS.  Pt states she has suppository medication at home she believes is zofran but states "i need phenergan" as zofran and reglan do not work.  Pt states abdominal pain is similar to her chronic pain, constant, aching, sore, cramping, 8/10 at worst. States she is followed by her PCP at [facility] but has not f/u with gastroenterology since she was last admitted about 3-4 weeks ago. States she has not scheduled a f/u appointment with a GI specialist.  Nurses note states pt has hx of stomach cancer, however, pt clarified she has been diagnosed with gastroparesis but does not f/u with GI.  Denies fever, chills, chest pain, SOB, urinary or vaginal symptoms. No sick contacts or recent travel. Denies hx of abdominal surgeries.   Past Medical History  Diagnosis Date  . Thrombocytosis     Hem/Onc suggested 2/2 chronic hepatits and/or iron deficiency anemia  . Iron deficiency anemia   . N&V (nausea and vomiting)     Chronic. Unclear etiology with multiple admission and ED visits. CT abdomen with and without contrast (02/2011)   showed no acute process. Gastic Emptying scan (01/2010) was normal. Ultrasound of the abdomen was within normal limits. Hepatitis B viral load was undectable. HIV NR. EGD - gastritis, Hpylori + s/p Rx  . Hypertension   . Hyperlipidemia   . Diabetes mellitus type 2, uncontrolled, with complications   . Recurrent boils   . 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 Cath 12/10- no restenosis or significant CAD progression  . Irregular menses     Small ovarian follicles seen on DZ(3/29)  . History of pyelonephritis     H/o GrpB Pyelonephritis (9/06) and UTI- 07/11- E.Coli, 12/10- GBS  . Abscess of tunica vaginalis     10/09- Abundant S. aureus- sensitive to all abx  . Obesity   . Gastritis   . Depression   . Peripheral neuropathy   . Fibromyalgia   . GERD (gastroesophageal reflux disease)   . Anxiety   . Gastroparesis     secondary to poorly controlled DM, last emptying study performed 01/2010  was normal but may be falsely positive as pt was on reglan  . Blood dyscrasia   . Substance abuse   . MI (myocardial infarction) 2008  . OSA (obstructive sleep apnea)     "suppose to wear mask but I don't" (04/22/2014)  . Seasonal asthma   . Hepatitis B, chronic     Hep BeAb+,Hep B cAb+ & Hep BsAg+ (9/06)  . Migraine     "  weekly" (04/22/2014)  . CVA (cerebral vascular accident) ~ 02/2014    denies residual on 04/22/2014  . CVA (cerebral vascular accident)     history of remote right cerebellar infarct noted on head CT at least since 10/2011  . Pneumonia     "this is probably the 2nd or 3rd time I've had pneumonia" (04/22/2014)   Past Surgical History  Procedure Laterality Date  . Cesarean section  1997  . Coronary angioplasty with stent placement  2008    "2 stents"  . Esophagogastroduodenoscopy N/A 04/23/2014    Procedure: ESOPHAGOGASTRODUODENOSCOPY (EGD);  Surgeon: Winfield Cunas., MD;  Location: [facility] ENDOSCOPY;  Service: Endoscopy;  Laterality: N/A;   Family History  Problem Relation Age of Onset  . Diabetes Father    History  Substance Use Topics  . Smoking status: Former Smoker    Types: Cigarettes    Quit date: 04/24/1996  . Smokeless tobacco: Never Used     Comment: quit smoking cigarettes age 49  . Alcohol Use: Yes     Comment: 04/22/2014 "might have a few drinks a month"   OB History    No data available     Review of Systems  Constitutional: Negative for fever and chills.  Respiratory: Negative for cough and shortness of breath.   Cardiovascular: Negative for chest pain and palpitations.  Gastrointestinal: Positive for nausea, vomiting and abdominal pain ( LLQ). Negative for diarrhea, constipation and blood in stool.  Genitourinary: Negative for dysuria, frequency, hematuria, flank pain, decreased urine volume, vaginal bleeding, vaginal discharge and vaginal pain.  Musculoskeletal: Negative for myalgias and back pain.  All other systems reviewed and are negative.     Allergies  Lisinopril and Morphine and related  Home Medications   Prior to Admission medications   Medication Sig Start Date End Date Taking? Authorizing Provider  albuterol (PROVENTIL HFA;VENTOLIN HFA) 108 (90 BASE) MCG/ACT inhaler Inhale 2 puffs into the lungs every 4 (four) hours as needed for wheezing or shortness of breath. 04/26/14  Yes Alexa Sherral Hammers, MD  atorvastatin (LIPITOR) 80 MG tablet Take 1 tablet (80 mg total) by mouth daily at 6 PM. 07/25/13  Yes Dixon Boos, MD  dicyclomine (BENTYL) 20 MG tablet Take 1 tablet (20 mg total) by mouth 2 (two) times daily. 12/31/13  Yes [name], MD  ferrous sulfate 325 (65 FE) MG tablet Take 1 tablet (325 mg total) by mouth 3 (three) times daily with meals. 04/26/14  Yes Alexa Sherral Hammers, MD  Fluticasone-Salmeterol (ADVAIR) 100-50 MCG/DOSE AEPB Inhale 1 puff into the lungs 2 (two) times daily. 04/26/14  Yes Alexa Sherral Hammers, MD  insulin aspart (NOVOLOG) 100 UNIT/ML injection Sliding scale- If sugar 150-199: inject 2 unit;  200-249: 4 units, 250-299: 7 units; 300-349: 10 units; Over 350: 12 units PENS PLEASE 05/16/14  Yes Luan Moore, MD  Insulin Glargine (LANTUS SOLOSTAR) 100 UNIT/ML Solostar Pen Inject 50 Units into the skin daily at 10 pm. 05/16/14  Yes Luan Moore, MD  labetalol (NORMODYNE) 100 MG tablet Take 1 tablet (100 mg total) by mouth 2 (two) times daily. 03/05/14  Yes Oval Linsey, MD  metoCLOPramide (REGLAN) 10 MG tablet Take 1 tablet (10 mg total) by mouth every 6 (six) hours. 04/26/14  Yes Alexa Sherral Hammers, MD  chlorpheniramine-HYDROcodone (TUSSIONEX) 10-8 MG/5ML LQCR Take 5 mLs by mouth every 12 (twelve) hours as needed for cough. Patient not taking: Reported on 06/30/2014 05/25/14   Luan Moore, MD  gabapentin ([place])  300 MG capsule Take 300 mg by mouth 3 (three) times daily.     Historical Provider, MD  levofloxacin (LEVAQUIN) 750 MG tablet Take 1 tablet (750 mg total) by mouth daily. Patient not taking: Reported on 06/30/2014 04/26/14   Alexa Sherral Hammers, MD  LORazepam (ATIVAN) 1 MG tablet Take 1 mg by mouth every 8 (eight) hours as needed for anxiety.    Historical Provider, MD  metroNIDAZOLE (FLAGYL) 500 MG tablet Take 1 tablet (500 mg total) by mouth every 12 (twelve) hours. Patient not taking: Reported on 06/30/2014 04/26/14   Alexa Sherral Hammers, MD  naproxen sodium (ANAPROX) 220 MG tablet Take 440 mg by mouth 2 (two) times daily as needed (pain).    Historical Provider, MD  omeprazole (PRILOSEC) 20 MG capsule Take 1 capsule (20 mg total) by mouth daily. 06/30/14   Noland Fordyce, PA-C  oxyCODONE-acetaminophen (PERCOCET/ROXICET) 5-325 MG per tablet Take 1-2 tablets by mouth every 6 (six) hours as needed for severe pain. Patient not taking: Reported on 06/30/2014 04/26/14   Alexa Sherral Hammers, MD  promethazine (PHENERGAN) 25 MG suppository Place 1 suppository (25 mg total) rectally every 6 (six)  hours as needed for nausea or vomiting. 06/30/14   Noland Fordyce, PA-C  senna-docusate (SENOKOT-S) 8.6-50 MG per tablet Take 1 tablet by mouth daily.    Historical Provider, MD   BP 132/85 mmHg  Pulse 78  Temp(Src) 98.2 F (36.8 C) (Oral)  Resp 18  SpO2 99%  LMP 06/28/2014 Physical Exam  Constitutional: She appears well-developed and well-nourished. No distress.  HENT:  Head: Normocephalic and atraumatic.  Eyes: Conjunctivae are normal. No scleral icterus.  Neck: Normal range of motion.  Cardiovascular: Normal rate, regular rhythm and normal heart sounds.   Pulmonary/Chest: Effort normal and breath sounds normal. No respiratory distress. She has no wheezes. She has no rales. She exhibits no tenderness.  Abdominal: Soft. Bowel sounds are normal. She exhibits no distension and no mass. There is tenderness. There is no rebound and no guarding.  Normal bowel sounds. Soft, non-distended, tenderness in suprapubic and LLQ. No rebound or guarding. No mass palpated. No CVAT  Musculoskeletal: Normal range of motion.  Neurological: She is alert.  Skin: Skin is warm and dry. She is not diaphoretic.  Nursing note and vitals reviewed.   ED Course  Procedures (including critical care time) Labs Review Labs Reviewed  CBC WITH DIFFERENTIAL/PLATELET - Abnormal; Notable for the following:    WBC 11.3 (*)    Hemoglobin 11.5 (*)    HCT 33.8 (*)    MCV 75.3 (*)    MCH 25.6 (*)    RDW 21.2 (*)    Neutro Abs 8.4 (*)    All other components within normal limits  COMPREHENSIVE METABOLIC PANEL - Abnormal; Notable for the following:    Potassium 3.4 (*)    Glucose, Bld 224 (*)    Albumin 3.3 (*)    All other components within normal limits  CBG MONITORING, ED - Abnormal; Notable for the following:    Glucose-Capillary 216 (*)    All other components within normal limits  CBG MONITORING, ED - Abnormal; Notable for the following:    Glucose-Capillary 193 (*)    All other components within normal  limits  LIPASE, BLOOD  POC URINE PREG, ED    Imaging Review No results found.   EKG Interpretation None      MDM   Final diagnoses:  Chronic abdominal pain  Non-intractable vomiting with nausea, vomiting  of unspecified type    Pt is a 39yo female with hx of chronic LLQ abdominal pain, nausea and vomiting.  On exam, pt is tender in suprapubic and LLQ w/o rebound or guarding. Pt's medical records reviewed.  No evidence of recorded stomach cancer, pt has been clinically diagnosed with gastroparesis by Dr. Benson Norway, GI.  Pt does not f/u regularly with GI.    Labs: mild hypokalemia, otherwise, c/w previous.   Pt given IV fluids, phenergan and dilaudid.   5:16 PM: pt talking on cell phone, NAD.  Pt states nausea has improved, she was able to take K-dur PO pill w/o difficulty, no vomiting in ED.  States pain is 8/10 down from a 10/10.  5:40 PM Pt sleeping, NAD,  Pt easily awakened. Offered pt ginger ale for fluid challenge, will give pt info for GI specialist again. Encouraged her to f/u, pt agrees. Pt requested pain and nausea medication to go home with. Discussed with pt that narcotic pain medication cannot be prescribed due to pain being chronic, pt verbalized understanding of this policy.    Urine preg: negative.  Filed Vitals:   06/30/14 1932  BP: 132/85  Pulse: 78  Temp:   Resp: 18  Temp 98.89F  Pt stable for discharge home. Discussed pt with Dr. Mingo Amber who also examined pt.  Doubt acute emergent process taking place at this time. Agrees with plan for symptomatic tx and discharge home w/o imaging as pt has had multiple plan films and CTs in the past.  Pt states symptoms today c/w her chronic pain.      Noland Fordyce, PA-C 06/30/14 2126  Evelina Bucy, MD 07/01/14 [phone]

## 2014-06-30 NOTE — ED Notes (Signed)
Patient still can not urinate at this time

## 2014-06-30 NOTE — ED Notes (Signed)
Pt states unable to urinate at this time. Pt informed of need for urine sample and instructed to provide one when able.

## 2014-06-30 NOTE — ED Notes (Signed)
EDPA ERWIN PRESENT at bedside.

## 2014-07-15 ENCOUNTER — Encounter (HOSPITAL_COMMUNITY): Payer: Self-pay | Admitting: Emergency Medicine

## 2014-07-15 ENCOUNTER — Emergency Department (HOSPITAL_COMMUNITY)
Admission: EM | Admit: 2014-07-15 | Discharge: 2014-07-16 | Disposition: A | Discharge status: Home / Self Care | Payer: Medicaid Other | Attending: Emergency Medicine | Admitting: Emergency Medicine

## 2014-07-15 ENCOUNTER — Emergency Department (HOSPITAL_COMMUNITY): Payer: Medicaid Other

## 2014-07-15 DIAGNOSIS — Z8619 Personal history of other infectious and parasitic diseases: Secondary | ICD-10-CM | POA: Insufficient documentation

## 2014-07-15 DIAGNOSIS — Z8742 Personal history of other diseases of the female genital tract: Secondary | ICD-10-CM | POA: Diagnosis not present

## 2014-07-15 DIAGNOSIS — Z3202 Encounter for pregnancy test, result negative: Secondary | ICD-10-CM | POA: Diagnosis not present

## 2014-07-15 DIAGNOSIS — Z79899 Other long term (current) drug therapy: Secondary | ICD-10-CM | POA: Diagnosis not present

## 2014-07-15 DIAGNOSIS — J45909 Unspecified asthma, uncomplicated: Secondary | ICD-10-CM | POA: Diagnosis not present

## 2014-07-15 DIAGNOSIS — G43909 Migraine, unspecified, not intractable, without status migrainosus: Secondary | ICD-10-CM | POA: Diagnosis not present

## 2014-07-15 DIAGNOSIS — R1032 Left lower quadrant pain: Secondary | ICD-10-CM | POA: Insufficient documentation

## 2014-07-15 DIAGNOSIS — Z794 Long term (current) use of insulin: Secondary | ICD-10-CM | POA: Diagnosis not present

## 2014-07-15 DIAGNOSIS — M797 Fibromyalgia: Secondary | ICD-10-CM | POA: Diagnosis not present

## 2014-07-15 DIAGNOSIS — Z955 Presence of coronary angioplasty implant and graft: Secondary | ICD-10-CM | POA: Insufficient documentation

## 2014-07-15 DIAGNOSIS — I2510 Atherosclerotic heart disease of native coronary artery without angina pectoris: Secondary | ICD-10-CM | POA: Diagnosis not present

## 2014-07-15 DIAGNOSIS — G629 Polyneuropathy, unspecified: Secondary | ICD-10-CM | POA: Insufficient documentation

## 2014-07-15 DIAGNOSIS — I10 Essential (primary) hypertension: Secondary | ICD-10-CM | POA: Diagnosis not present

## 2014-07-15 DIAGNOSIS — Z8701 Personal history of pneumonia (recurrent): Secondary | ICD-10-CM | POA: Insufficient documentation

## 2014-07-15 DIAGNOSIS — E119 Type 2 diabetes mellitus without complications: Secondary | ICD-10-CM | POA: Diagnosis not present

## 2014-07-15 DIAGNOSIS — F419 Anxiety disorder, unspecified: Secondary | ICD-10-CM | POA: Diagnosis not present

## 2014-07-15 DIAGNOSIS — G8929 Other chronic pain: Secondary | ICD-10-CM | POA: Insufficient documentation

## 2014-07-15 DIAGNOSIS — D509 Iron deficiency anemia, unspecified: Secondary | ICD-10-CM | POA: Insufficient documentation

## 2014-07-15 DIAGNOSIS — R112 Nausea with vomiting, unspecified: Secondary | ICD-10-CM | POA: Insufficient documentation

## 2014-07-15 DIAGNOSIS — E785 Hyperlipidemia, unspecified: Secondary | ICD-10-CM | POA: Insufficient documentation

## 2014-07-15 DIAGNOSIS — R109 Unspecified abdominal pain: Secondary | ICD-10-CM

## 2014-07-15 DIAGNOSIS — Z872 Personal history of diseases of the skin and subcutaneous tissue: Secondary | ICD-10-CM | POA: Insufficient documentation

## 2014-07-15 DIAGNOSIS — E669 Obesity, unspecified: Secondary | ICD-10-CM | POA: Insufficient documentation

## 2014-07-15 DIAGNOSIS — I252 Old myocardial infarction: Secondary | ICD-10-CM | POA: Diagnosis not present

## 2014-07-15 DIAGNOSIS — Z8673 Personal history of transient ischemic attack (TIA), and cerebral infarction without residual deficits: Secondary | ICD-10-CM | POA: Insufficient documentation

## 2014-07-15 DIAGNOSIS — Z792 Long term (current) use of antibiotics: Secondary | ICD-10-CM | POA: Diagnosis not present

## 2014-07-15 DIAGNOSIS — R0789 Other chest pain: Secondary | ICD-10-CM | POA: Insufficient documentation

## 2014-07-15 DIAGNOSIS — Z7951 Long term (current) use of inhaled steroids: Secondary | ICD-10-CM | POA: Diagnosis not present

## 2014-07-15 DIAGNOSIS — K219 Gastro-esophageal reflux disease without esophagitis: Secondary | ICD-10-CM | POA: Diagnosis not present

## 2014-07-15 DIAGNOSIS — Z87448 Personal history of other diseases of urinary system: Secondary | ICD-10-CM | POA: Diagnosis not present

## 2014-07-15 DIAGNOSIS — Z87891 Personal history of nicotine dependence: Secondary | ICD-10-CM | POA: Diagnosis not present

## 2014-07-15 LAB — CBC WITH DIFFERENTIAL/PLATELET
Basophils Absolute: 0 10*3/uL (ref 0.0–0.1)
Basophils Relative: 0 % (ref 0–1)
EOS ABS: 0.5 10*3/uL (ref 0.0–0.7)
Eosinophils Relative: 4 % (ref 0–5)
HCT: 37.3 % (ref 36.0–46.0)
HEMOGLOBIN: 13.5 g/dL (ref 12.0–15.0)
LYMPHS ABS: 3.6 10*3/uL (ref 0.7–4.0)
Lymphocytes Relative: 24 % (ref 12–46)
MCH: 26.8 pg (ref 26.0–34.0)
MCHC: 36.2 g/dL — ABNORMAL HIGH (ref 30.0–36.0)
MCV: 74 fL — AB (ref 78.0–100.0)
Monocytes Absolute: 0.9 10*3/uL (ref 0.1–1.0)
Monocytes Relative: 6 % (ref 3–12)
NEUTROS PCT: 66 % (ref 43–77)
Neutro Abs: 10.3 10*3/uL — ABNORMAL HIGH (ref 1.7–7.7)
PLATELETS: 431 10*3/uL — AB (ref 150–400)
RBC: 5.04 MIL/uL (ref 3.87–5.11)
RDW: 18.3 % — ABNORMAL HIGH (ref 11.5–15.5)
WBC: 15.3 10*3/uL — AB (ref 4.0–10.5)

## 2014-07-15 LAB — I-STAT CHEM 8, ED
BUN: 14 mg/dL (ref 6–23)
CREATININE: 0.8 mg/dL (ref 0.50–1.10)
Calcium, Ion: 1.25 mmol/L — ABNORMAL HIGH (ref 1.12–1.23)
Chloride: 100 mmol/L (ref 96–112)
Glucose, Bld: 306 mg/dL — ABNORMAL HIGH (ref 70–99)
HCT: 43 % (ref 36.0–46.0)
Hemoglobin: 14.6 g/dL (ref 12.0–15.0)
Potassium: 3.7 mmol/L (ref 3.5–5.1)
Sodium: 138 mmol/L (ref 135–145)
TCO2: 21 mmol/L (ref 0–100)

## 2014-07-15 LAB — I-STAT TROPONIN, ED
TROPONIN I, POC: 0 ng/mL (ref 0.00–0.08)
TROPONIN I, POC: 0 ng/mL (ref 0.00–0.08)

## 2014-07-15 LAB — LIPASE, BLOOD: LIPASE: 34 U/L (ref 11–59)

## 2014-07-15 LAB — I-STAT BETA HCG BLOOD, ED (MC, WL, AP ONLY): I-stat hCG, quantitative: <5 m[IU]/mL (ref <5)

## 2014-07-15 MED ORDER — ONDANSETRON 4 MG PO TBDP
4.0000 mg | ORAL_TABLET | Freq: Once | ORAL | Status: AC
  Administered 2014-07-15: 4 mg via ORAL

## 2014-07-15 MED ORDER — PROMETHAZINE HCL 25 MG/ML IJ SOLN
25.0000 mg | Freq: Once | INTRAMUSCULAR | Status: AC
  Administered 2014-07-15: 25 mg via INTRAVENOUS
  Filled 2014-07-15: qty 1

## 2014-07-15 MED ORDER — OXYCODONE-ACETAMINOPHEN 5-325 MG PO TABS: 2.0000 | ORAL_TABLET | ORAL | Status: DC | PRN

## 2014-07-15 MED ORDER — SODIUM CHLORIDE 0.9 % IV BOLUS (SEPSIS)
1000.0000 mL | Freq: Once | INTRAVENOUS | Status: AC
  Administered 2014-07-15: 1000 mL via INTRAVENOUS

## 2014-07-15 MED ORDER — ONDANSETRON 4 MG PO TBDP
ORAL_TABLET | ORAL | Status: AC
  Filled 2014-07-15: qty 1

## 2014-07-15 MED ORDER — PROMETHAZINE HCL 25 MG RE SUPP: 25.0000 mg | Freq: Four times a day (QID) | RECTAL | Status: DC | PRN

## 2014-07-15 MED ORDER — HYDROMORPHONE HCL 1 MG/ML IJ SOLN
1.0000 mg | Freq: Once | INTRAMUSCULAR | Status: AC
  Administered 2014-07-15: 1 mg via INTRAVENOUS
  Filled 2014-07-15: qty 1

## 2014-07-15 MED ORDER — HYDROMORPHONE HCL 1 MG/ML IJ SOLN
1.0000 mg | Freq: Once | INTRAMUSCULAR | Status: AC
Start: 2014-07-15 — End: 2014-07-15
  Administered 2014-07-15: 1 mg via INTRAVENOUS
  Filled 2014-07-15: qty 1

## 2014-07-15 NOTE — Discharge Instructions (Signed)
Chest Pain (Nonspecific) °It is often hard to give a specific diagnosis for the cause of chest pain. There is always a chance that your pain could be related to something serious, such as a heart attack or a blood clot in the lungs. You need to follow up with your health care provider for further evaluation. °CAUSES  °· Heartburn. °· Pneumonia or bronchitis. °· Anxiety or stress. °· Inflammation around your heart (pericarditis) or lung (pleuritis or pleurisy). °· A blood clot in the lung. °· A collapsed lung (pneumothorax). It can develop suddenly on its own (spontaneous pneumothorax) or from trauma to the chest. °· Shingles infection (herpes zoster virus). °The chest wall is composed of bones, muscles, and cartilage. Any of these can be the source of the pain. °· The bones can be bruised by injury. °· The muscles or cartilage can be strained by coughing or overwork. °· The cartilage can be affected by inflammation and become sore (costochondritis). °DIAGNOSIS  °Lab tests or other studies may be needed to find the cause of your pain. Your health care provider may have you take a test called an ambulatory electrocardiogram (ECG). An ECG records your heartbeat patterns over a 24-hour period. You may also have other tests, such as: °· Transthoracic echocardiogram (TTE). During echocardiography, sound waves are used to evaluate how blood flows through your heart. °· Transesophageal echocardiogram (TEE). °· Cardiac monitoring. This allows your health care provider to monitor your heart rate and rhythm in real time. °· Holter monitor. This is a portable device that records your heartbeat and can help diagnose heart arrhythmias. It allows your health care provider to track your heart activity for several days, if needed. °· Stress tests by exercise or by giving medicine that makes the heart beat faster. °TREATMENT  °· Treatment depends on what may be causing your chest pain. Treatment may include: °¨ Acid blockers for  heartburn. °¨ Anti-inflammatory medicine. °¨ Pain medicine for inflammatory conditions. °¨ Antibiotics if an infection is present. °· You may be advised to change lifestyle habits. This includes stopping smoking and avoiding alcohol, caffeine, and chocolate. °· You may be advised to keep your head raised (elevated) when sleeping. This reduces the chance of acid going backward from your stomach into your esophagus. °Most of the time, nonspecific chest pain will improve within 2-3 days with rest and mild pain medicine.  °HOME CARE INSTRUCTIONS  °· If antibiotics were prescribed, take them as directed. Finish them even if you start to feel better. °· For the next few days, avoid physical activities that bring on chest pain. Continue physical activities as directed. °· Do not use any tobacco products, including cigarettes, chewing tobacco, or electronic cigarettes. °· Avoid drinking alcohol. °· Only take medicine as directed by your health care provider. °· Follow your health care provider's suggestions for further testing if your chest pain does not go away. °· Keep any follow-up appointments you made. If you do not go to an appointment, you could develop lasting (chronic) problems with pain. If there is any problem keeping an appointment, call to reschedule. °SEEK MEDICAL CARE IF:  °· Your chest pain does not go away, even after treatment. °· You have a rash with blisters on your chest. °· You have a fever. °SEEK IMMEDIATE MEDICAL CARE IF:  °· You have increased chest pain or pain that spreads to your arm, neck, jaw, back, or abdomen. °· You have shortness of breath. °· You have an increasing cough, or you cough   up blood.  You have severe back or abdominal pain.  You feel nauseous or vomit.  You have severe weakness.  You faint.  You have chills. This is an emergency. Do not wait to see if the pain will go away. Get medical help at once. Call your local emergency services (911 in [place] Do not drive  yourself to the hospital. MAKE SURE YOU:   Understand these instructions.  Will watch your condition.  Will get help right away if you are not doing well or get worse. Document Released: 01/18/2005 Document Revised: 04/15/2013 Document Reviewed: 11/14/2007 [facility] Patient Information 2015 [place], [place]. This information is not intended to replace advice given to you by your health care provider. Make sure you discuss any questions you have with your health care provider.   Abdominal Pain, Women Abdominal (stomach, pelvic, or belly) pain can be caused by many things. It is important to tell your doctor:  The location of the pain.  Does it come and go or is it present all the time?  Are there things that start the pain (eating certain foods, exercise)?  Are there other symptoms associated with the pain (fever, nausea, vomiting, diarrhea)? All of this is helpful to know when trying to find the cause of the pain. CAUSES   Stomach: virus or bacteria infection, or ulcer.  Intestine: appendicitis (inflamed appendix), regional ileitis (Crohn's disease), ulcerative colitis (inflamed colon), irritable bowel syndrome, diverticulitis (inflamed diverticulum of the colon), or cancer of the stomach or intestine.  Gallbladder disease or stones in the gallbladder.  Kidney disease, kidney stones, or infection.  Pancreas infection or cancer.  Fibromyalgia (pain disorder).  Diseases of the female organs:  Uterus: fibroid (non-cancerous) tumors or infection.  Fallopian tubes: infection or tubal pregnancy.  Ovary: cysts or tumors.  Pelvic adhesions (scar tissue).  Endometriosis (uterus lining tissue growing in the pelvis and on the pelvic organs).  Pelvic congestion syndrome (female organs filling up with blood just before the menstrual period).  Pain with the menstrual period.  Pain with ovulation (producing an egg).  Pain with an IUD (intrauterine device, birth control) in the  uterus.  Cancer of the female organs.  Functional pain (pain not caused by a disease, may improve without treatment).  Psychological pain.  Depression. DIAGNOSIS  Your doctor will decide the seriousness of your pain by doing an examination.  Blood tests.  X-rays.  Ultrasound.  CT scan (computed tomography, special type of X-ray).  MRI (magnetic resonance imaging).  Cultures, for infection.  Barium enema (dye inserted in the large intestine, to better view it with X-rays).  Colonoscopy (looking in intestine with a lighted tube).  Laparoscopy (minor surgery, looking in abdomen with a lighted tube).  Major abdominal exploratory surgery (looking in abdomen with a large incision). TREATMENT  The treatment will depend on the cause of the pain.   Many cases can be observed and treated at home.  Over-the-counter medicines recommended by your caregiver.  Prescription medicine.  Antibiotics, for infection.  Birth control pills, for painful periods or for ovulation pain.  Hormone treatment, for endometriosis.  Nerve blocking injections.  Physical therapy.  Antidepressants.  Counseling with a psychologist or psychiatrist.  Minor or major surgery. HOME CARE INSTRUCTIONS   Do not take laxatives, unless directed by your caregiver.  Take over-the-counter pain medicine only if ordered by your caregiver. Do not take aspirin because it can cause an upset stomach or bleeding.  Try a clear liquid diet (broth or water)  as ordered by your caregiver. Slowly move to a bland diet, as tolerated, if the pain is related to the stomach or intestine.  Have a thermometer and take your temperature several times a day, and record it.  Bed rest and sleep, if it helps the pain.  Avoid sexual intercourse, if it causes pain.  Avoid stressful situations.  Keep your follow-up appointments and tests, as your caregiver orders.  If the pain does not go away with medicine or surgery, you  may try:  Acupuncture.  Relaxation exercises (yoga, meditation).  Group therapy.  Counseling. SEEK MEDICAL CARE IF:   You notice certain foods cause stomach pain.  Your home care treatment is not helping your pain.  You need stronger pain medicine.  You want your IUD removed.  You feel faint or lightheaded.  You develop nausea and vomiting.  You develop a rash.  You are having side effects or an allergy to your medicine. SEEK IMMEDIATE MEDICAL CARE IF:   Your pain does not go away or gets worse.  You have a fever.  Your pain is felt only in portions of the abdomen. The right side could possibly be appendicitis. The left lower portion of the abdomen could be colitis or diverticulitis.  You are passing blood in your stools (bright red or black tarry stools, with or without vomiting).  You have blood in your urine.  You develop chills, with or without a fever.  You pass out. MAKE SURE YOU:   Understand these instructions.  Will watch your condition.  Will get help right away if you are not doing well or get worse. Document Released: 02/05/2007 Document Revised: 08/25/2013 Document Reviewed: 02/25/2009 [facility] Patient Information 2015 [place], [place]. This information is not intended to replace advice given to you by your health care provider. Make sure you discuss any questions you have with your health care provider.

## 2014-07-15 NOTE — ED Notes (Signed)
Pt given ice chips

## 2014-07-15 NOTE — ED Notes (Signed)
Pt difficult to arouse, pt resting.

## 2014-07-15 NOTE — ED Notes (Addendum)
Pt difficult to arouse, when she does respond she immediately falls back asleep.

## 2014-07-15 NOTE — ED Notes (Signed)
Dr. Docherty at bedside.

## 2014-07-15 NOTE — ED Provider Notes (Signed)
CSN: [id number]     Arrival date & time 07/15/14  1830 History   First MD Initiated Contact with Patient 07/15/14 1934     Chief Complaint  Patient presents with  . Abdominal Pain     (Consider location/radiation/quality/duration/timing/severity/associated sxs/prior Treatment) Patient is a 39 y.o. female presenting with abdominal pain. The history is provided by the patient. No language interpreter was used.  Abdominal Pain Pain location:  LLQ Pain quality: aching and sharp   Pain radiates to:  Does not radiate Pain severity:  Severe Onset quality:  Sudden Duration:  3 days Timing:  Constant Progression:  Unchanged Chronicity:  Chronic Context comment:  PO intake Relieved by:  Vomiting Worsened by:  Eating Ineffective treatments:  None tried Associated symptoms: chest pain, nausea and vomiting   Associated symptoms: no chills, no constipation, no cough, no diarrhea, no dysuria, no fatigue, no fever, no melena, no shortness of breath, no sore throat, no vaginal bleeding and no vaginal discharge   Associated symptoms comment:  Central chest pain w/ vomiting, intermittent,    Past Medical History  Diagnosis Date  . Thrombocytosis     Hem/Onc suggested 2/2 chronic hepatits and/or iron deficiency anemia  . Iron deficiency anemia   . N&V (nausea and vomiting)     Chronic. Unclear etiology with multiple admission and ED visits. CT abdomen with and without contrast (02/2011)  showed no acute process. Gastic Emptying scan (01/2010) was normal. Ultrasound of the abdomen was within normal limits. Hepatitis B viral load was undectable. HIV NR. EGD - gastritis, Hpylori + s/p Rx  . Hypertension   . Hyperlipidemia   . Diabetes mellitus type 2, uncontrolled, with complications   . Recurrent boils   . 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 Cath 12/10- no restenosis or  significant CAD progression  . Irregular menses     Small ovarian follicles seen on VP(7/10)  . History of pyelonephritis     H/o GrpB Pyelonephritis (9/06) and UTI- 07/11- E.Coli, 12/10- GBS  . Abscess of tunica vaginalis     10/09- Abundant S. aureus- sensitive to all abx  . Obesity   . Gastritis   . Depression   . Peripheral neuropathy   . Fibromyalgia   . GERD (gastroesophageal reflux disease)   . Anxiety   . Gastroparesis     secondary to poorly controlled DM, last emptying study performed 01/2010  was normal but may be falsely positive as pt was on reglan  . Blood dyscrasia   . Substance abuse   . MI (myocardial infarction) 2008  . OSA (obstructive sleep apnea)     "suppose to wear mask but I don't" (04/22/2014)  . Seasonal asthma   . Hepatitis B, chronic     Hep BeAb+,Hep B cAb+ & Hep BsAg+ (9/06)  . Migraine     "weekly" (04/22/2014)  . CVA (cerebral vascular accident) ~ 02/2014    denies residual on 04/22/2014  . CVA (cerebral vascular accident)     history of remote right cerebellar infarct noted on head CT at least since 10/2011  . Pneumonia     "this is probably the 2nd or 3rd time I've had pneumonia" (04/22/2014)   Past Surgical History  Procedure Laterality Date  . Cesarean section  1997  . Coronary angioplasty with stent placement  2008    "2 stents"  . Esophagogastroduodenoscopy  N/A 04/23/2014    Procedure: ESOPHAGOGASTRODUODENOSCOPY (EGD);  Surgeon: Winfield Cunas., MD;  Location: [facility] ENDOSCOPY;  Service: Endoscopy;  Laterality: N/A;   Family History  Problem Relation Age of Onset  . Diabetes Father    History  Substance Use Topics  . Smoking status: Former Smoker    Types: Cigarettes    Quit date: 04/24/1996  . Smokeless tobacco: Never Used     Comment: quit smoking cigarettes age 13  . Alcohol Use: Yes     Comment: 04/22/2014 "might have a few drinks a month"   OB History    No data available     Review of Systems  Constitutional:  Negative for fever, chills, diaphoresis, activity change, appetite change and fatigue.  HENT: Negative for congestion, facial swelling, rhinorrhea and sore throat.   Eyes: Negative for photophobia and discharge.  Respiratory: Negative for cough, chest tightness and shortness of breath.   Cardiovascular: Positive for chest pain. Negative for palpitations and leg swelling.  Gastrointestinal: Positive for nausea, vomiting and abdominal pain. Negative for diarrhea, constipation and melena.  Endocrine: Negative for polydipsia and polyuria.  Genitourinary: Negative for dysuria, frequency, vaginal bleeding, vaginal discharge, difficulty urinating and pelvic pain.  Musculoskeletal: Negative for back pain, arthralgias, neck pain and neck stiffness.  Skin: Negative for color change and wound.  Allergic/Immunologic: Negative for immunocompromised state.  Neurological: Negative for facial asymmetry, weakness, numbness and headaches.  Hematological: Does not bruise/bleed easily.  Psychiatric/Behavioral: Negative for confusion and agitation.      Allergies  Lisinopril and Morphine and related  Home Medications   Prior to Admission medications   Medication Sig Start Date End Date Taking? Authorizing Provider  albuterol (PROVENTIL HFA;VENTOLIN HFA) 108 (90 BASE) MCG/ACT inhaler Inhale 2 puffs into the lungs every 4 (four) hours as needed for wheezing or shortness of breath. 04/26/14  Yes Alexa Sherral Hammers, MD  atorvastatin (LIPITOR) 80 MG tablet Take 1 tablet (80 mg total) by mouth daily at 6 PM. 07/25/13  Yes Dixon Boos, MD  chlorpheniramine-HYDROcodone (TUSSIONEX) 10-8 MG/5ML LQCR Take 5 mLs by mouth every 12 (twelve) hours as needed for cough. 05/25/14  Yes Luan Moore, MD  dicyclomine (BENTYL) 20 MG tablet Take 1 tablet (20 mg total) by mouth 2 (two) times daily. 12/31/13  Yes [name], MD  ferrous sulfate 325 (65 FE) MG tablet Take 1 tablet (325 mg total) by mouth 3 (three) times daily with  meals. 04/26/14  Yes Alexa Sherral Hammers, MD  Fluticasone-Salmeterol (ADVAIR) 100-50 MCG/DOSE AEPB Inhale 1 puff into the lungs 2 (two) times daily. 04/26/14  Yes Alexa Sherral Hammers, MD  gabapentin (NEURONTIN) 300 MG capsule Take 300 mg by mouth 3 (three) times daily.    Yes Historical Provider, MD  insulin aspart (NOVOLOG) 100 UNIT/ML injection Sliding scale- If sugar 150-199: inject 2 unit;  200-249: 4 units, 250-299: 7 units; 300-349: 10 units; Over 350: 12 units PENS PLEASE 05/16/14  Yes Luan Moore, MD  Insulin Glargine (LANTUS SOLOSTAR) 100 UNIT/ML Solostar Pen Inject 50 Units into the skin daily at 10 pm. 05/16/14  Yes Luan Moore, MD  labetalol (NORMODYNE) 100 MG tablet Take 1 tablet (100 mg total) by mouth 2 (two) times daily. 03/05/14  Yes Oval Linsey, MD  LORazepam (ATIVAN) 1 MG tablet Take 1 mg by mouth every 8 (eight) hours as needed for anxiety.   Yes Historical Provider, MD  metoCLOPramide (REGLAN) 10 MG tablet Take 1 tablet (10 mg total) by mouth every  6 (six) hours. 04/26/14  Yes Alexa Sherral Hammers, MD  naproxen sodium (ANAPROX) 220 MG tablet Take 440 mg by mouth 2 (two) times daily as needed (pain).   Yes Historical Provider, MD  omeprazole (PRILOSEC) 20 MG capsule Take 1 capsule (20 mg total) by mouth daily. 06/30/14  Yes Noland Fordyce, PA-C  senna-docusate (SENOKOT-S) 8.6-50 MG per tablet Take 1 tablet by mouth daily.   Yes Historical Provider, MD  levofloxacin (LEVAQUIN) 750 MG tablet Take 1 tablet (750 mg total) by mouth daily. Patient not taking: Reported on 06/30/2014 04/26/14   Alexa Sherral Hammers, MD  metroNIDAZOLE (FLAGYL) 500 MG tablet Take 1 tablet (500 mg total) by mouth every 12 (twelve) hours. Patient not taking: Reported on 06/30/2014 04/26/14   Alexa Sherral Hammers, MD  oxyCODONE-acetaminophen (PERCOCET) 5-325 MG per tablet Take 2 tablets by mouth every 4 (four) hours as needed. 07/15/14   Ernestina Patches, MD  promethazine (PHENERGAN) 25 MG suppository Place 1 suppository (25 mg  total) rectally every 6 (six) hours as needed for nausea or vomiting. 07/15/14   Ernestina Patches, MD   BP 143/89 mmHg  Pulse 90  Temp(Src) 97.7 F (36.5 C) (Oral)  Resp 14  Ht 5\' 7"  (1.702 m)  Wt 200 lb (90.719 kg)  BMI 31.32 kg/m2  SpO2 95%  LMP 06/28/2014 Physical Exam  Constitutional: She is oriented to person, place, and time. She appears well-developed and well-nourished. No distress.  HENT:  Head: Normocephalic and atraumatic.  Mouth/Throat: No oropharyngeal exudate.  Eyes: Pupils are equal, round, and reactive to light.  Neck: Normal range of motion. Neck supple.  Cardiovascular: Normal rate, regular rhythm and normal heart sounds.  Exam reveals no gallop and no friction rub.   No murmur heard. Pulmonary/Chest: Effort normal and breath sounds normal. No respiratory distress. She has no wheezes. She has no rales.    Abdominal: Soft. Bowel sounds are normal. She exhibits no distension and no mass. There is no hepatosplenomegaly. There is tenderness in the left lower quadrant. There is no rigidity, no rebound, no guarding, no CVA tenderness, no tenderness at McBurney's point and negative Murphy's sign.  Musculoskeletal: Normal range of motion. She exhibits no edema or tenderness.  Neurological: She is alert and oriented to person, place, and time.  Skin: Skin is warm and dry.  Psychiatric: She has a normal mood and affect.    ED Course  Procedures (including critical care time) Labs Review Labs Reviewed  CBC WITH DIFFERENTIAL/PLATELET - Abnormal; Notable for the following:    WBC 15.3 (*)    MCV 74.0 (*)    MCHC 36.2 (*)    RDW 18.3 (*)    Platelets 431 (*)    Neutro Abs 10.3 (*)    All other components within normal limits  I-STAT CHEM 8, ED - Abnormal; Notable for the following:    Glucose, Bld 306 (*)    Calcium, Ion 1.25 (*)    All other components within normal limits  LIPASE, BLOOD  I-STAT TROPOININ, ED  I-STAT BETA HCG BLOOD, ED ([facility], [facility], [facility] ONLY)  I-STAT  TROPOININ, ED    Imaging Review Dg Chest 2 View  07/15/2014   CLINICAL DATA:  Three-day history of chest pain  EXAM: CHEST  2 VIEW  COMPARISON:  May 05, 2014  FINDINGS: There is no edema or consolidation. Heart size and pulmonary vascularity are within normal limits. No adenopathy. No pneumothorax. No bone lesions.  IMPRESSION: No edema or consolidation.   Electronically  Signed   By: Lowella Grip III M.D.   On: 07/15/2014 20:44     EKG Interpretation   Date/Time:  Wednesday July 15 2014 18:38:57 EDT Ventricular Rate:  127 PR Interval:  116 QRS Duration: 70 QT Interval:  310 QTC Calculation: 450 R Axis:   65 Text Interpretation:  Sinus tachycardia Otherwise normal ECG Confirmed by  Annaleigha Woo  MD, Taevyn Hausen ([id number]) on 07/15/2014 8:34:29 PM      MDM   Final diagnoses:  Chronic abdominal pain  Atypical chest pain    Pt is a 39 y.o. female with Pmhx as above who presents with acute worsening of her chronic left lower quadrant pain for 3 days.  She states that she has been having similar pain for about 2-1/2 years with associated nausea and vomiting.  She states because she has a history of a heart attack when she is has these episodes and gets sweaty or has chest pain.  She'll come to the ED to be evaluated.  She states she started having intermittent substernal pain yesterday associated with vomiting and had an episode of diaphoresis yesterday.  Pain is unchanged from her chronic pain.  She does not currently follow consistently with gastroenterology or cardiology.  She admits to using cocaine when her pain is uncontrolled at home.  On physical exam, patient initially tachycardic, though heart rate normalized when I'm in the room.  Abdomen is soft.  She does not appear to have tenderness when distracted.  Bowel sounds are normal.  Cardiac, pulmonary exam is benign except for reproducible midsternal chest wall pain.  She's a mild leukocytosis which is seen on similar labs.  Electrolytes,  creatinine and troponin are normal.  She's had 6 abdominal CTs in her system, all of which are normal.  At this point, we'll treat symptomatically.  I do not believe she is likely to have an acute surgical cause of her pain such as small bowel obstruction, diverticulitis, cholecystitis, or appendicitis.  Patient feels somewhat improved in the emergency room.  She is sleepy after pain medicine, appears in no acute distress. She has not had any vomiting and has been able to tolerate juice and crackers.  Patient we discharged home with short course of Norco as well as Phenergan suppositories.  I have encouraged her to reestablish with GI and also seek care at pain management clinic   Sheba N Ates evaluation in the Emergency Department is complete. It has been determined that no acute conditions requiring further emergency intervention are present at this time. The patient/guardian have been advised of the diagnosis and plan. We have discussed signs and symptoms that warrant return to the ED, such as changes or worsening in symptoms, worsening pain, fever, inability to tolerate liquids      Ernestina Patches, MD 07/16/14 0010

## 2014-07-15 NOTE — ED Notes (Signed)
Patient transported to X-ray 

## 2014-07-15 NOTE — ED Notes (Signed)
Pt given food and drink by Dr. Tawnya Crook

## 2014-07-15 NOTE — ED Notes (Addendum)
Pt c;o abd pain x's 2 days with nausea and vomiting.  Pt has chronic abd pain with vomiting and takes phenergan for same but st's it's not helping.  Pt also c/o chest pain, onset last pm

## 2014-07-20 ENCOUNTER — Observation Stay (HOSPITAL_COMMUNITY)
Admission: EM | Admit: 2014-07-20 | Discharge: 2014-07-22 | Disposition: A | Discharge status: Home / Self Care | Payer: Medicaid Other | Attending: Internal Medicine | Admitting: Internal Medicine

## 2014-07-20 ENCOUNTER — Emergency Department (HOSPITAL_COMMUNITY): Payer: Medicaid Other

## 2014-07-20 ENCOUNTER — Encounter (HOSPITAL_COMMUNITY): Payer: Self-pay | Admitting: Emergency Medicine

## 2014-07-20 DIAGNOSIS — I2510 Atherosclerotic heart disease of native coronary artery without angina pectoris: Secondary | ICD-10-CM | POA: Diagnosis not present

## 2014-07-20 DIAGNOSIS — E1140 Type 2 diabetes mellitus with diabetic neuropathy, unspecified: Secondary | ICD-10-CM | POA: Diagnosis not present

## 2014-07-20 DIAGNOSIS — D473 Essential (hemorrhagic) thrombocythemia: Secondary | ICD-10-CM | POA: Diagnosis not present

## 2014-07-20 DIAGNOSIS — E785 Hyperlipidemia, unspecified: Secondary | ICD-10-CM | POA: Insufficient documentation

## 2014-07-20 DIAGNOSIS — E1142 Type 2 diabetes mellitus with diabetic polyneuropathy: Secondary | ICD-10-CM | POA: Diagnosis present

## 2014-07-20 DIAGNOSIS — R112 Nausea with vomiting, unspecified: Secondary | ICD-10-CM | POA: Insufficient documentation

## 2014-07-20 DIAGNOSIS — R000 Tachycardia, unspecified: Secondary | ICD-10-CM | POA: Diagnosis present

## 2014-07-20 DIAGNOSIS — I10 Essential (primary) hypertension: Secondary | ICD-10-CM | POA: Insufficient documentation

## 2014-07-20 DIAGNOSIS — R1032 Left lower quadrant pain: Secondary | ICD-10-CM

## 2014-07-20 DIAGNOSIS — R079 Chest pain, unspecified: Secondary | ICD-10-CM | POA: Diagnosis not present

## 2014-07-20 DIAGNOSIS — G4733 Obstructive sleep apnea (adult) (pediatric): Secondary | ICD-10-CM | POA: Insufficient documentation

## 2014-07-20 DIAGNOSIS — R1110 Vomiting, unspecified: Secondary | ICD-10-CM

## 2014-07-20 DIAGNOSIS — Z794 Long term (current) use of insulin: Secondary | ICD-10-CM | POA: Diagnosis not present

## 2014-07-20 DIAGNOSIS — Z8673 Personal history of transient ischemic attack (TIA), and cerebral infarction without residual deficits: Secondary | ICD-10-CM | POA: Diagnosis not present

## 2014-07-20 DIAGNOSIS — F1210 Cannabis abuse, uncomplicated: Secondary | ICD-10-CM | POA: Insufficient documentation

## 2014-07-20 DIAGNOSIS — F1410 Cocaine abuse, uncomplicated: Secondary | ICD-10-CM | POA: Diagnosis not present

## 2014-07-20 DIAGNOSIS — E1165 Type 2 diabetes mellitus with hyperglycemia: Secondary | ICD-10-CM

## 2014-07-20 DIAGNOSIS — K3184 Gastroparesis: Secondary | ICD-10-CM | POA: Insufficient documentation

## 2014-07-20 DIAGNOSIS — G8929 Other chronic pain: Secondary | ICD-10-CM | POA: Diagnosis not present

## 2014-07-20 DIAGNOSIS — Z862 Personal history of diseases of the blood and blood-forming organs and certain disorders involving the immune mechanism: Secondary | ICD-10-CM | POA: Diagnosis present

## 2014-07-20 DIAGNOSIS — F1910 Other psychoactive substance abuse, uncomplicated: Secondary | ICD-10-CM | POA: Diagnosis present

## 2014-07-20 DIAGNOSIS — E119 Type 2 diabetes mellitus without complications: Secondary | ICD-10-CM | POA: Diagnosis present

## 2014-07-20 DIAGNOSIS — E1143 Type 2 diabetes mellitus with diabetic autonomic (poly)neuropathy: Secondary | ICD-10-CM | POA: Diagnosis present

## 2014-07-20 LAB — COMPREHENSIVE METABOLIC PANEL
ALBUMIN: 3.9 g/dL (ref 3.5–5.2)
ALT: 8 U/L (ref 0–35)
AST: 18 U/L (ref 0–37)
Alkaline Phosphatase: 70 U/L (ref 39–117)
Anion gap: 13 (ref 5–15)
BUN: 10 mg/dL (ref 6–23)
CALCIUM: 9.4 mg/dL (ref 8.4–10.5)
CO2: 21 mmol/L (ref 19–32)
CREATININE: 0.9 mg/dL (ref 0.50–1.10)
Chloride: 101 mmol/L (ref 96–112)
GFR calc Af Amer: >90 mL/min (ref >90)
GFR calc non Af Amer: 79 mL/min — ABNORMAL LOW (ref >90)
Glucose, Bld: 252 mg/dL — ABNORMAL HIGH (ref 70–99)
Potassium: 3.8 mmol/L (ref 3.5–5.1)
SODIUM: 135 mmol/L (ref 135–145)
Total Bilirubin: 0.5 mg/dL (ref 0.3–1.2)
Total Protein: 8.7 g/dL — ABNORMAL HIGH (ref 6.0–8.3)

## 2014-07-20 LAB — CBC WITH DIFFERENTIAL/PLATELET
Basophils Absolute: 0 10*3/uL (ref 0.0–0.1)
Basophils Relative: 0 % (ref 0–1)
EOS PCT: 1 % (ref 0–5)
Eosinophils Absolute: 0.2 10*3/uL (ref 0.0–0.7)
HEMATOCRIT: 41.1 % (ref 36.0–46.0)
Hemoglobin: 14 g/dL (ref 12.0–15.0)
LYMPHS ABS: 2.6 10*3/uL (ref 0.7–4.0)
Lymphocytes Relative: 17 % (ref 12–46)
MCH: 25.9 pg — ABNORMAL LOW (ref 26.0–34.0)
MCHC: 34.1 g/dL (ref 30.0–36.0)
MCV: 76 fL — ABNORMAL LOW (ref 78.0–100.0)
Monocytes Absolute: 0.8 10*3/uL (ref 0.1–1.0)
Monocytes Relative: 5 % (ref 3–12)
NEUTROS ABS: 12.3 10*3/uL — AB (ref 1.7–7.7)
Neutrophils Relative %: 77 % (ref 43–77)
PLATELETS: 454 10*3/uL — AB (ref 150–400)
RBC: 5.41 MIL/uL — ABNORMAL HIGH (ref 3.87–5.11)
RDW: 17.1 % — ABNORMAL HIGH (ref 11.5–15.5)
WBC: 15.9 10*3/uL — AB (ref 4.0–10.5)

## 2014-07-20 LAB — URINE MICROSCOPIC-ADD ON

## 2014-07-20 LAB — I-STAT TROPONIN, ED: TROPONIN I, POC: 0 ng/mL (ref 0.00–0.08)

## 2014-07-20 LAB — RAPID URINE DRUG SCREEN, HOSP PERFORMED
AMPHETAMINES: NOT DETECTED
BENZODIAZEPINES: NOT DETECTED
Barbiturates: NOT DETECTED
COCAINE: POSITIVE — AB
Opiates: NOT DETECTED
TETRAHYDROCANNABINOL: POSITIVE — AB

## 2014-07-20 LAB — URINALYSIS, ROUTINE W REFLEX MICROSCOPIC
Bilirubin Urine: NEGATIVE
Hgb urine dipstick: NEGATIVE
Ketones, ur: 40 mg/dL — AB
LEUKOCYTES UA: NEGATIVE
Nitrite: NEGATIVE
PH: 7 (ref 5.0–8.0)
Protein, ur: 30 mg/dL — AB
Specific Gravity, Urine: 1.025 (ref 1.005–1.030)
Urobilinogen, UA: 1 mg/dL (ref 0.0–1.0)

## 2014-07-20 LAB — LIPASE, BLOOD: LIPASE: 18 U/L (ref 11–59)

## 2014-07-20 MED ORDER — SODIUM CHLORIDE 0.9 % IV BOLUS (SEPSIS)
1000.0000 mL | Freq: Once | INTRAVENOUS | Status: AC
  Administered 2014-07-20: 1000 mL via INTRAVENOUS

## 2014-07-20 MED ORDER — METOCLOPRAMIDE HCL 5 MG/ML IJ SOLN
10.0000 mg | Freq: Once | INTRAMUSCULAR | Status: AC
  Administered 2014-07-20: 10 mg via INTRAVENOUS
  Filled 2014-07-20: qty 2

## 2014-07-20 MED ORDER — HYDROMORPHONE HCL 1 MG/ML IJ SOLN
1.0000 mg | Freq: Once | INTRAMUSCULAR | Status: AC
  Administered 2014-07-20: 1 mg via INTRAVENOUS
  Filled 2014-07-20: qty 1

## 2014-07-20 MED ORDER — ONDANSETRON HCL 4 MG/2ML IJ SOLN
4.0000 mg | Freq: Once | INTRAMUSCULAR | Status: AC
  Administered 2014-07-20: 4 mg via INTRAVENOUS
  Filled 2014-07-20: qty 2

## 2014-07-20 MED ORDER — HALOPERIDOL LACTATE 5 MG/ML IJ SOLN
2.0000 mg | Freq: Once | INTRAMUSCULAR | Status: AC
  Administered 2014-07-20: 2 mg via INTRAVENOUS
  Filled 2014-07-20: qty 1

## 2014-07-20 MED ORDER — ACETAMINOPHEN 650 MG RE SUPP
650.0000 mg | Freq: Once | RECTAL | Status: AC
  Administered 2014-07-20: 650 mg via RECTAL
  Filled 2014-07-20: qty 1

## 2014-07-20 NOTE — ED Provider Notes (Signed)
CSN: [id number]     Arrival date & time 07/20/14  1554 History   First MD Initiated Contact with Patient 07/20/14 1620     Chief Complaint  Patient presents with  . Chest Pain  . Abdominal Pain  . Emesis     (Consider location/radiation/quality/duration/timing/severity/associated sxs/prior Treatment) Patient is a 39 y.o. female presenting with abdominal pain and vomiting.  Abdominal Pain Pain location:  LLQ Pain quality: sharp   Pain radiates to:  Does not radiate Pain severity:  Severe Onset quality:  Gradual Duration:  2 days Timing:  Constant Progression:  Worsening Chronicity:  Recurrent Context comment:  Recently seen for similar symptoms a few days ago.  Has also recently been admitted for similar symptoms.  Relieved by:  Nothing Worsened by:  Movement, position changes and palpation Associated symptoms: chest pain (when vomiting), nausea and vomiting   Associated symptoms: no constipation, no diarrhea, no dysuria, no fever, no hematuria, no vaginal bleeding and no vaginal discharge   Emesis Associated symptoms: abdominal pain   Associated symptoms: no diarrhea     Past Medical History  Diagnosis Date  . Thrombocytosis     Hem/Onc suggested 2/2 chronic hepatits and/or iron deficiency anemia  . Iron deficiency anemia   . N&V (nausea and vomiting)     Chronic. Unclear etiology with multiple admission and ED visits. CT abdomen with and without contrast (02/2011)  showed no acute process. Gastic Emptying scan (01/2010) was normal. Ultrasound of the abdomen was within normal limits. Hepatitis B viral load was undectable. HIV NR. EGD - gastritis, Hpylori + s/p Rx  . Hypertension   . Hyperlipidemia   . Diabetes mellitus type 2, uncontrolled, with complications   . Recurrent boils   . 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 Cath 12/10- no restenosis or  significant CAD progression  . Irregular menses     Small ovarian follicles seen on DV(7/61)  . History of pyelonephritis     H/o GrpB Pyelonephritis (9/06) and UTI- 07/11- E.Coli, 12/10- GBS  . Abscess of tunica vaginalis     10/09- Abundant S. aureus- sensitive to all abx  . Obesity   . Gastritis   . Depression   . Peripheral neuropathy   . Fibromyalgia   . GERD (gastroesophageal reflux disease)   . Anxiety   . Gastroparesis     secondary to poorly controlled DM, last emptying study performed 01/2010  was normal but may be falsely positive as pt was on reglan  . Blood dyscrasia   . Substance abuse   . MI (myocardial infarction) 2008  . OSA (obstructive sleep apnea)     "suppose to wear mask but I don't" (04/22/2014)  . Seasonal asthma   . Hepatitis B, chronic     Hep BeAb+,Hep B cAb+ & Hep BsAg+ (9/06)  . Migraine     "weekly" (04/22/2014)  . CVA (cerebral vascular accident) ~ 02/2014    denies residual on 04/22/2014  . CVA (cerebral vascular accident)     history of remote right cerebellar infarct noted on head CT at least since 10/2011  . Pneumonia     "this is probably the 2nd or 3rd time I've had pneumonia" (04/22/2014)   Past Surgical History  Procedure Laterality Date  . Cesarean section  1997  . Coronary angioplasty with stent placement  2008    "2 stents"  .  Esophagogastroduodenoscopy N/A 04/23/2014    Procedure: ESOPHAGOGASTRODUODENOSCOPY (EGD);  Surgeon: Winfield Cunas., MD;  Location[facility]COPY;  Service: Endoscopy;  Laterality: N/A;   Family History  Problem Relation Age of Onset  . Diabetes Father    History  Substance Use Topics  . Smoking status: Former Smoker    Types: Cigarettes    Quit date: 04/24/1996  . Smokeless tobacco: Never Used     Comment: quit smoking cigarettes age 89  . Alcohol Use: Yes     Comment: 04/22/2014 "might have a few drinks a month"   OB History    No data available     Review of Systems  Constitutional:  Negative for fever.  Cardiovascular: Positive for chest pain (when vomiting).  Gastrointestinal: Positive for nausea, vomiting and abdominal pain. Negative for diarrhea and constipation.  Genitourinary: Negative for dysuria, hematuria, vaginal bleeding and vaginal discharge.  All other systems reviewed and are negative.     Allergies  Lisinopril and Morphine and related  Home Medications   Prior to Admission medications   Medication Sig Start Date End Date Taking? Authorizing Provider  albuterol (PROVENTIL HFA;VENTOLIN HFA) 108 (90 BASE) MCG/ACT inhaler Inhale 2 puffs into the lungs every 4 (four) hours as needed for wheezing or shortness of breath. 04/26/14   Alexa Sherral Hammers, MD  atorvastatin (LIPITOR) 80 MG tablet Take 1 tablet (80 mg total) by mouth daily at 6 PM. 07/25/13   Dixon Boos, MD  chlorpheniramine-HYDROcodone (TUSSIONEX) 10-8 MG/5ML LQCR Take 5 mLs by mouth every 12 (twelve) hours as needed for cough. 05/25/14   Luan Moore, MD  dicyclomine (BENTYL) 20 MG tablet Take 1 tablet (20 mg total) by mouth 2 (two) times daily. 12/31/13   [name], MD  ferrous sulfate 325 (65 FE) MG tablet Take 1 tablet (325 mg total) by mouth 3 (three) times daily with meals. 04/26/14   Alexa Sherral Hammers, MD  Fluticasone-Salmeterol (ADVAIR) 100-50 MCG/DOSE AEPB Inhale 1 puff into the lungs 2 (two) times daily. 04/26/14   Alexa Sherral Hammers, MD  gabapentin (NEURONTIN) 300 MG capsule Take 300 mg by mouth 3 (three) times daily.     Historical Provider, MD  insulin aspart (NOVOLOG) 100 UNIT/ML injection Sliding scale- If sugar 150-199: inject 2 unit;  200-249: 4 units, 250-299: 7 units; 300-349: 10 units; Over 350: 12 units PENS PLEASE 05/16/14   Luan Moore, MD  Insulin Glargine (LANTUS SOLOSTAR) 100 UNIT/ML Solostar Pen Inject 50 Units into the skin daily at 10 pm. 05/16/14   Luan Moore, MD  labetalol (NORMODYNE) 100 MG tablet Take 1 tablet (100 mg total) by mouth 2 (two) times daily. 03/05/14    Oval Linsey, MD  levofloxacin (LEVAQUIN) 750 MG tablet Take 1 tablet (750 mg total) by mouth daily. Patient not taking: Reported on 06/30/2014 04/26/14   Alexa Sherral Hammers, MD  LORazepam (ATIVAN) 1 MG tablet Take 1 mg by mouth every 8 (eight) hours as needed for anxiety.    Historical Provider, MD  metoCLOPramide (REGLAN) 10 MG tablet Take 1 tablet (10 mg total) by mouth every 6 (six) hours. 04/26/14   Alexa Sherral Hammers, MD  metroNIDAZOLE (FLAGYL) 500 MG tablet Take 1 tablet (500 mg total) by mouth every 12 (twelve) hours. Patient not taking: Reported on 06/30/2014 04/26/14   Alexa Sherral Hammers, MD  naproxen sodium (ANAPROX) 220 MG tablet Take 440 mg by mouth 2 (two) times daily as needed (pain).    Historical Provider, MD  omeprazole (  PRILOSEC) 20 MG capsule Take 1 capsule (20 mg total) by mouth daily. 06/30/14   Noland Fordyce, PA-C  oxyCODONE-acetaminophen (PERCOCET) 5-325 MG per tablet Take 2 tablets by mouth every 4 (four) hours as needed. 07/15/14   Ernestina Patches, MD  promethazine (PHENERGAN) 25 MG suppository Place 1 suppository (25 mg total) rectally every 6 (six) hours as needed for nausea or vomiting. 07/15/14   Ernestina Patches, MD  senna-docusate (SENOKOT-S) 8.6-50 MG per tablet Take 1 tablet by mouth daily.    Historical Provider, MD   BP 151/113 mmHg  Pulse 155  Temp(Src) 99.6 F (37.6 C) (Oral)  Resp 24  SpO2 100%  LMP 06/28/2014 Physical Exam  Constitutional: She is oriented to person, place, and time. She appears well-developed and well-nourished. She appears distressed (appears uncomfortable, writhing on bed).  HENT:  Head: Normocephalic and atraumatic.  Mouth/Throat: Oropharynx is clear and moist.  Eyes: Conjunctivae are normal. Pupils are equal, round, and reactive to light. No scleral icterus.  Neck: Neck supple.  Cardiovascular: Regular rhythm, normal heart sounds and intact distal pulses.  Tachycardia present.   No murmur heard. Pulmonary/Chest: Effort normal and breath  sounds normal. No stridor. No respiratory distress. She has no rales. She exhibits tenderness (to sternal palpation. ).  Abdominal: Soft. Bowel sounds are normal. She exhibits no distension. There is tenderness in the left lower quadrant. There is no rigidity, no rebound and no guarding.  Musculoskeletal: Normal range of motion.  Neurological: She is alert and oriented to person, place, and time.  Skin: Skin is warm and dry. No rash noted.  Psychiatric: She has a normal mood and affect. Her behavior is normal.  Nursing note and vitals reviewed.   ED Course  Procedures (including critical care time) Labs Review Labs Reviewed  CBC WITH DIFFERENTIAL/PLATELET - Abnormal; Notable for the following:    WBC 15.9 (*)    RBC 5.41 (*)    MCV 76.0 (*)    MCH 25.9 (*)    RDW 17.1 (*)    Platelets 454 (*)    Neutro Abs 12.3 (*)    All other components within normal limits  COMPREHENSIVE METABOLIC PANEL  LIPASE, BLOOD  URINE RAPID DRUG SCREEN (HOSP PERFORMED)  URINALYSIS, ROUTINE W REFLEX MICROSCOPIC  I-STAT TROPOININ, ED    Imaging Review Dg Abd Acute W/chest  07/20/2014   CLINICAL DATA:  Chronic chest and abdominal pain and vomiting.  EXAM: ACUTE ABDOMEN SERIES (ABDOMEN 2 VIEW & CHEST 1 VIEW)  COMPARISON:  07/15/2014 and prior radiographs.  FINDINGS: The cardiomediastinal silhouette is unremarkable.  There is no evidence of airspace disease, pleural effusion or pneumothorax.  The bowel gas pattern is unremarkable.  There is no evidence of bowel obstruction or pneumoperitoneum.  No suspicious calcifications are identified. No acute bony abnormalities are noted.  IMPRESSION: Negative abdominal radiographs.  No acute cardiopulmonary disease.   Electronically Signed   By: Margarette Canada M.D.   On: 07/20/2014 20:07     EKG Interpretation   Date/Time:  Monday July 20 2014 16:01:07 EDT Ventricular Rate:  143 PR Interval:  109 QRS Duration: 90 QT Interval:  308 QTC Calculation: 475 R Axis:    61 Text Interpretation:  Sinus tachycardia Atrial premature complex Probable  left atrial enlargement Minimal ST depression, lateral leads Baseline  wander in lead(s) II III aVF V1 V2 V3 V4 Baseline wander No significant  change was found Confirmed by [facility]  MD, TREY ([id number]) on 07/20/2014  4:30:39 PM  MDM   Final diagnoses:  Left lower quadrant pain  Intractable vomiting with nausea, vomiting of unspecified type  Tachycardia    39 yo female with a history of gastroparesis and chronic LLQ abdominal pain presenting with similar symptoms.  She also has chest pain since yesterday which she associates with vomiting and appears MSK on exam.  She is tachycardic and writhing on the bed.  I will attempt to control her nausea with IV reglan and her pain with tylenol.  I don't think giving her narcotics is in her best interest due to her gastroparesis.    Nausea and pain eventually controlled after multiple meds.  Tachycardia, however, has persisted despite fluid boluses and pain control.  She denies using cocaine today.  Discussed with internal medicine who will admit for obs, continued fluids, and further evaluation.  Abdomen remained soft on multiple repeat abdominal exams.  Serita Grit, MD 07/21/14 [phone]

## 2014-07-20 NOTE — ED Notes (Signed)
Pt has been told a urine sample is needed from her, says the just used the restroom before coming back and will let us know when she can

## 2014-07-20 NOTE — ED Notes (Signed)
Patient transported to X-ray 

## 2014-07-20 NOTE — ED Notes (Signed)
[facility] seen [facility] for same. Pt has chronic abd pain with vomiting, states phenegran is not working. Pt also c/o chest pain too.

## 2014-07-21 ENCOUNTER — Encounter (HOSPITAL_COMMUNITY): Payer: Self-pay | Admitting: General Practice

## 2014-07-21 DIAGNOSIS — K3184 Gastroparesis: Secondary | ICD-10-CM | POA: Diagnosis not present

## 2014-07-21 DIAGNOSIS — E1143 Type 2 diabetes mellitus with diabetic autonomic (poly)neuropathy: Secondary | ICD-10-CM

## 2014-07-21 DIAGNOSIS — R1032 Left lower quadrant pain: Secondary | ICD-10-CM

## 2014-07-21 DIAGNOSIS — D72829 Elevated white blood cell count, unspecified: Secondary | ICD-10-CM | POA: Diagnosis not present

## 2014-07-21 DIAGNOSIS — F1410 Cocaine abuse, uncomplicated: Secondary | ICD-10-CM

## 2014-07-21 DIAGNOSIS — E1140 Type 2 diabetes mellitus with diabetic neuropathy, unspecified: Secondary | ICD-10-CM | POA: Diagnosis not present

## 2014-07-21 DIAGNOSIS — R0789 Other chest pain: Secondary | ICD-10-CM | POA: Diagnosis not present

## 2014-07-21 DIAGNOSIS — E1165 Type 2 diabetes mellitus with hyperglycemia: Secondary | ICD-10-CM

## 2014-07-21 DIAGNOSIS — R079 Chest pain, unspecified: Secondary | ICD-10-CM | POA: Diagnosis not present

## 2014-07-21 DIAGNOSIS — D473 Essential (hemorrhagic) thrombocythemia: Secondary | ICD-10-CM

## 2014-07-21 DIAGNOSIS — R112 Nausea with vomiting, unspecified: Secondary | ICD-10-CM | POA: Insufficient documentation

## 2014-07-21 DIAGNOSIS — R000 Tachycardia, unspecified: Secondary | ICD-10-CM | POA: Diagnosis not present

## 2014-07-21 DIAGNOSIS — I2510 Atherosclerotic heart disease of native coronary artery without angina pectoris: Secondary | ICD-10-CM

## 2014-07-21 DIAGNOSIS — R109 Unspecified abdominal pain: Secondary | ICD-10-CM

## 2014-07-21 DIAGNOSIS — E785 Hyperlipidemia, unspecified: Secondary | ICD-10-CM

## 2014-07-21 DIAGNOSIS — I10 Essential (primary) hypertension: Secondary | ICD-10-CM

## 2014-07-21 DIAGNOSIS — G8929 Other chronic pain: Secondary | ICD-10-CM | POA: Insufficient documentation

## 2014-07-21 LAB — GLUCOSE, CAPILLARY
GLUCOSE-CAPILLARY: 222 mg/dL — AB (ref 70–99)
GLUCOSE-CAPILLARY: 319 mg/dL — AB (ref 70–99)
GLUCOSE-CAPILLARY: 442 mg/dL — AB (ref 70–99)
Glucose-Capillary: 193 mg/dL — ABNORMAL HIGH (ref 70–99)
Glucose-Capillary: 101 mg/dL — ABNORMAL HIGH (ref 70–99)

## 2014-07-21 LAB — BASIC METABOLIC PANEL
Anion gap: 8 (ref 5–15)
BUN: 13 mg/dL (ref 6–23)
CO2: 20 mmol/L (ref 19–32)
Calcium: 8.7 mg/dL (ref 8.4–10.5)
Chloride: 106 mmol/L (ref 96–112)
Creatinine, Ser: 0.95 mg/dL (ref 0.50–1.10)
GFR calc non Af Amer: 74 mL/min — ABNORMAL LOW (ref >90)
GFR, EST AFRICAN AMERICAN: 86 mL/min — AB (ref >90)
GLUCOSE: 204 mg/dL — AB (ref 70–99)
Potassium: 3.8 mmol/L (ref 3.5–5.1)
Sodium: 134 mmol/L — ABNORMAL LOW (ref 135–145)

## 2014-07-21 LAB — CBC
HEMATOCRIT: 35.8 % — AB (ref 36.0–46.0)
Hemoglobin: 12.3 g/dL (ref 12.0–15.0)
MCH: 26.1 pg (ref 26.0–34.0)
MCHC: 34.4 g/dL (ref 30.0–36.0)
MCV: 76 fL — ABNORMAL LOW (ref 78.0–100.0)
PLATELETS: 412 10*3/uL — AB (ref 150–400)
RBC: 4.71 MIL/uL (ref 3.87–5.11)
RDW: 17.2 % — AB (ref 11.5–15.5)
WBC: 15.1 10*3/uL — AB (ref 4.0–10.5)

## 2014-07-21 LAB — TROPONIN I: Troponin I: 0.03 ng/mL (ref <0.031)

## 2014-07-21 LAB — HIV ANTIBODY (ROUTINE TESTING W REFLEX): HIV SCREEN 4TH GENERATION: NONREACTIVE

## 2014-07-21 MED ORDER — METOCLOPRAMIDE HCL 10 MG PO TABS
10.0000 mg | ORAL_TABLET | Freq: Four times a day (QID) | ORAL | Status: DC
  Administered 2014-07-21 – 2014-07-22 (×6): 10 mg via ORAL
  Filled 2014-07-21 (×9): qty 1

## 2014-07-21 MED ORDER — INSULIN GLARGINE 100 UNIT/ML ~~LOC~~ SOLN
10.0000 [IU] | Freq: Every day | SUBCUTANEOUS | Status: DC
  Administered 2014-07-21: 10 [IU] via SUBCUTANEOUS
  Filled 2014-07-21 (×2): qty 0.1

## 2014-07-21 MED ORDER — SODIUM CHLORIDE 0.9 % IV SOLN
INTRAVENOUS | Status: DC
  Administered 2014-07-21: 05:00:00 via INTRAVENOUS

## 2014-07-21 MED ORDER — SODIUM CHLORIDE 0.9 % IV BOLUS (SEPSIS)
1000.0000 mL | Freq: Once | INTRAVENOUS | Status: AC
  Administered 2014-07-21: 1000 mL via INTRAVENOUS

## 2014-07-21 MED ORDER — INSULIN ASPART 100 UNIT/ML ~~LOC~~ SOLN
0.0000 [IU] | Freq: Three times a day (TID) | SUBCUTANEOUS | Status: DC
  Administered 2014-07-21: 3 [IU] via SUBCUTANEOUS
  Administered 2014-07-21: 11 [IU] via SUBCUTANEOUS
  Administered 2014-07-22: 15 [IU] via SUBCUTANEOUS
  Administered 2014-07-22: 3 [IU] via SUBCUTANEOUS

## 2014-07-21 MED ORDER — MOMETASONE FURO-FORMOTEROL FUM 100-5 MCG/ACT IN AERO
2.0000 | INHALATION_SPRAY | Freq: Two times a day (BID) | RESPIRATORY_TRACT | Status: DC
  Administered 2014-07-21 – 2014-07-22 (×2): 2 via RESPIRATORY_TRACT
  Filled 2014-07-21: qty 8.8

## 2014-07-21 MED ORDER — ALBUTEROL SULFATE (2.5 MG/3ML) 0.083% IN NEBU: 3.0000 mL | INHALATION_SOLUTION | RESPIRATORY_TRACT | Status: DC | PRN

## 2014-07-21 MED ORDER — GABAPENTIN 300 MG PO CAPS
300.0000 mg | ORAL_CAPSULE | Freq: Three times a day (TID) | ORAL | Status: DC
Start: 2014-07-21 — End: 2014-07-22
  Administered 2014-07-21 – 2014-07-22 (×4): 300 mg via ORAL
  Filled 2014-07-21 (×6): qty 1

## 2014-07-21 MED ORDER — HEPARIN SODIUM (PORCINE) 5000 UNIT/ML IJ SOLN
5000.0000 [IU] | Freq: Three times a day (TID) | INTRAMUSCULAR | Status: DC
  Administered 2014-07-21 – 2014-07-22 (×4): 5000 [IU] via SUBCUTANEOUS
  Filled 2014-07-21 (×6): qty 1

## 2014-07-21 MED ORDER — SODIUM CHLORIDE 0.9 % IJ SOLN
3.0000 mL | Freq: Two times a day (BID) | INTRAMUSCULAR | Status: DC
  Administered 2014-07-21 – 2014-07-22 (×4): 3 mL via INTRAVENOUS

## 2014-07-21 MED ORDER — INSULIN ASPART 100 UNIT/ML ~~LOC~~ SOLN
5.0000 [IU] | Freq: Once | SUBCUTANEOUS | Status: AC
  Administered 2014-07-21: 5 [IU] via SUBCUTANEOUS

## 2014-07-21 MED ORDER — INSULIN ASPART 100 UNIT/ML ~~LOC~~ SOLN: 0.0000 [IU] | Freq: Every day | SUBCUTANEOUS | Status: DC

## 2014-07-21 MED ORDER — NAPROXEN 250 MG PO TABS
500.0000 mg | ORAL_TABLET | Freq: Two times a day (BID) | ORAL | Status: DC | PRN
  Administered 2014-07-21 – 2014-07-22 (×2): 500 mg via ORAL
  Filled 2014-07-21 (×3): qty 2

## 2014-07-21 NOTE — Discharge Instructions (Signed)
Please go to the ED or call the clinic if you notice fevers, increase nausea or vomiting.   You will need to follow up with you GI doctor for a gastric emptying study.

## 2014-07-21 NOTE — Progress Notes (Signed)
UR completed 

## 2014-07-21 NOTE — Progress Notes (Signed)
Admitted pt from [facility] aaox3.oriented to bed and call bell. Tele monitor on. VS taken and recorded. Paged admitting MD will be seen shortly.

## 2014-07-21 NOTE — Progress Notes (Signed)
Inpatient Diabetes Program Recommendations  AACE/ADA: New Consensus Statement on Inpatient Glycemic Control (2013)  Target Ranges:  Prepandial:   less than 140 mg/dL      Peak postprandial:   less than 180 mg/dL (1-2 hours)      Critically ill patients:  140 - 180 mg/dL  Results for SHABREE, TEBBETTS (MRN [id number]) as of 07/21/2014 07:28  Ref. Range 07/21/2014 01:58 07/21/2014 06:29  Glucose-Capillary Latest Range: 70-99 mg/dL 222 (H) 193 (H)   Inpatient Diabetes Program Recommendations Insulin - Basal: add a portion of patient's home dose Lantus   Thank you  Raoul Pitch BSN, RN,CDE Inpatient Diabetes Coordinator [phone] (team pager)

## 2014-07-21 NOTE — Progress Notes (Addendum)
Patient's blood sugar was 442 mg/dl per nursing tech. Patel MD notified and reviewing orders for patient. Will continue to monitor.

## 2014-07-21 NOTE — Progress Notes (Signed)
Subjective: Pt denies n/v. States she has LLQ abdominal pain that is chronic and at her baseline. She states she would prefer to try a solid breakfast this morning rather than soft diet.   Objective: Vital signs in last 24 hours: Filed Vitals:   07/21/14 0007 07/21/14 0154 07/21/14 0631 07/21/14 0858  BP: 124/76 130/92 124/84 127/88  Pulse: 116 131 113 115  Temp:  98.3 F (36.8 C) 98.4 F (36.9 C) 98.1 F (36.7 C)  TempSrc:  Oral Oral Oral  Resp: 12 18 18 18   Height:  5\' 7"  (1.702 m)    Weight:  203 lb 7.8 oz (92.3 kg)    SpO2: 95% 100% 98% 100%   Weight change:   Intake/Output Summary (Last 24 hours) at 07/21/14 0938 Last data filed at 07/21/14 0933  Gross per 24 hour  Intake 1226.67 ml  Output    550 ml  Net 676.67 ml   General: NAD, lethargic Lungs: CTAB, no wheezing Cardiac: RRR, no murmurs GI: soft, active bowel sounds, TTP of left lower quadrant, no guarding Neuro: CN II-XII grossly intact  Lab Results: Basic Metabolic Panel:  Recent Labs Lab 07/20/14 1624 07/21/14 0502  NA 135 134*  K 3.8 3.8  CL 101 106  CO2 21 20  GLUCOSE 252* 204*  BUN 10 13  CREATININE 0.90 0.95  CALCIUM 9.4 8.7   Liver Function Tests:  Recent Labs Lab 07/20/14 1624  AST 18  ALT 8  ALKPHOS 70  BILITOT 0.5  PROT 8.7*  ALBUMIN 3.9    Recent Labs Lab 07/15/14 1839 07/20/14 1624  LIPASE 34 18   CBC:  Recent Labs Lab 07/15/14 1839  07/20/14 1624 07/21/14 0507  WBC 15.3*  --  15.9* 15.1*  NEUTROABS 10.3*  --  12.3*  --   HGB 13.5  < > 14.0 12.3  HCT 37.3  < > 41.1 35.8*  MCV 74.0*  --  76.0* 76.0*  PLT 431*  --  454* 412*  < > = values in this interval not displayed. Cardiac Enzymes:  Recent Labs Lab 07/21/14 0502  TROPONINI 0.03   CBG:  Recent Labs Lab 07/21/14 0158 07/21/14 0629  GLUCAP 222* 193*   Urine Drug Screen: Drugs of Abuse     Component Value Date/Time   LABOPIA NONE DETECTED 07/20/2014 1749   LABOPIA NEGATIVE 02/21/2010 2201   COCAINSCRNUR POSITIVE* 07/20/2014 1749   COCAINSCRNUR NEGATIVE 02/21/2010 2201   LABBENZ NONE DETECTED 07/20/2014 1749   LABBENZ NEGATIVE 02/21/2010 2201   AMPHETMU NONE DETECTED 07/20/2014 1749   AMPHETMU NEGATIVE 02/21/2010 2201   THCU POSITIVE* 07/20/2014 1749   LABBARB NONE DETECTED 07/20/2014 1749    Urinalysis:  Recent Labs Lab 07/20/14 1749  COLORURINE YELLOW  LABSPEC 1.025  PHURINE 7.0  GLUCOSEU >1000*  HGBUR NEGATIVE  BILIRUBINUR NEGATIVE  KETONESUR 40*  PROTEINUR 30*  UROBILINOGEN 1.0  NITRITE NEGATIVE  LEUKOCYTESUR NEGATIVE   Studies/Results: Dg Abd Acute W/chest  07/20/2014   CLINICAL DATA:  Chronic chest and abdominal pain and vomiting.  EXAM: ACUTE ABDOMEN SERIES (ABDOMEN 2 VIEW & CHEST 1 VIEW)  COMPARISON:  07/15/2014 and prior radiographs.  FINDINGS: The cardiomediastinal silhouette is unremarkable.  There is no evidence of airspace disease, pleural effusion or pneumothorax.  The bowel gas pattern is unremarkable.  There is no evidence of bowel obstruction or pneumoperitoneum.  No suspicious calcifications are identified. No acute bony abnormalities are noted.  IMPRESSION: Negative abdominal radiographs.  No acute cardiopulmonary disease.  Electronically Signed   By: Margarette Canada M.D.   On: 07/20/2014 20:07   Medications: I have reviewed the patient's current medications. Scheduled Meds: . gabapentin  300 mg Oral TID  . heparin  5,000 Units Subcutaneous 3 times per day  . insulin aspart  0-15 Units Subcutaneous TID WC  . insulin aspart  0-5 Units Subcutaneous QHS  . metoCLOPramide  10 mg Oral Q6H  . mometasone-formoterol  2 puff Inhalation BID  . sodium chloride  3 mL Intravenous Q12H   Continuous Infusions: . sodium chloride 100 mL/hr at 07/21/14 0456   PRN Meds:.albuterol Assessment/Plan: Principal Problem:   Tachycardia Active Problems:   Poorly controlled type 2 diabetes mellitus with peripheral neuropathy   Hyperlipidemia   Polysubstance abuse    Essential hypertension   Leukocytosis   Diabetic gastroparesis associated with type 2 diabetes mellitus   Left lower quadrant pain   Acute on chronic abdominal pain: improved, will see if pt can tolerate reg diet this morning, if so can d/c home. Will need better DM control and cessation of THC and cocaine which patient was advised of this morning.  -Continue Reglan 10 mg every 6 hours - HIV pending  Atypical chest pain: trop x 1 negative, denies any chest pain, will cont to monitor. Likely pain associated with n/v that patient initially presented with.   Poorly controlled Type 2 diabetes with neuropathy: A1c 10.3, 02/10/2014.  -Continue gabapentin - A1c pending - moderate sliding scale insulin - will need to f/u with PCP on discharge  Coronary artery disease status post angioplasty: Angioplasty of occluded PDA performed in February 2008. Unsure of why she is not on aspirin. - can start asa 81mg  on d/c as has hx of CVA in 2014 as well.   Hypertension: Home medications include labetalol 100 mg twice daily. -holding home med, currently normotensive  Hyperlipidemia: resume home Lipitor 80 mg on d/c  Asthma: Home medications include albuterol inhaler, Advair. -Continue albuterol nebulizer every 4 hours as needed for wheezing - Dulera 2 puffs twice daily  Polysubstance use [cocaine, THC]: No records per [place] for past 6 months. -again addressed cocaine and THC use, stressed importance of cessation of these drugs.   Leukocytosis: WBC 15.9, baseline 11-16. Per chart review, she has been evaluated by hematology who felt is related to her chronic hepatitis. She does have a history of hepatitis B infection with HBsAg+ when last checked November 2012 though HCV & HBVeAg both unremarkable. No changes in her LFTs. Ab CT 12/31/13 without any identified changes to liver.  -WBC 15.1 this morning, will cont to monitor.   Thrombocytosis: baseline 400s to 600s. Thought to be  related to iron deficiency. Anemia panel 04/22/2014 with iron less than 10, ferritin 15, TIBC incalculable. She is not anemic with hemoglobin 14.0 on admission. At the time of discharge her most recent hospitalization, she was put on ferrous sulfate 325 mg 3 times daily with meals. -trending down, plts 412 will cont to monitor   #FEN:  -Diet: regular  #DVT prophylaxis: Heparin  #CODE STATUS: FULL CODE   Dispo: Discharge today if tolerates diet.  The patient does have a current PCP Luan Moore, MD) and does need an [facility] hospital follow-up appointment after discharge.  The patient does not know have transportation limitations that hinder transportation to clinic appointments       Norman Herrlich, MD 07/21/2014, 9:38 AM

## 2014-07-21 NOTE — Discharge Summary (Signed)
Name: Shirley [name] MRN: [id number] DOB: [birth year] 39 y.o. PCP: Shirley Moore, MD  Date of Admission: 07/20/2014  3:55 PM Date of Discharge: 07/21/2014 Attending Physician: Shirley Fireman, MD  Discharge Diagnosis: Principal Problem:   Tachycardia Active Problems:   Poorly controlled type 2 diabetes mellitus with peripheral neuropathy   Hyperlipidemia   Polysubstance abuse   Essential hypertension   Leukocytosis   Diabetic gastroparesis associated with type 2 diabetes mellitus   Left lower quadrant pain  Discharge Medications:   Medication List    TAKE these medications        albuterol 108 (90 BASE) MCG/ACT inhaler  Commonly known as:  PROVENTIL HFA;VENTOLIN HFA  Inhale 2 puffs into the lungs every 4 (four) hours as needed for wheezing or shortness of breath.     atorvastatin 80 MG tablet  Commonly known as:  LIPITOR  Take 1 tablet (80 mg total) by mouth daily at 6 PM.     chlorpheniramine-HYDROcodone 10-8 MG/5ML Lqcr  Commonly known as:  TUSSIONEX  Take 5 mLs by mouth every 12 (twelve) hours as needed for cough.     dicyclomine 20 MG tablet  Commonly known as:  BENTYL  Take 1 tablet (20 mg total) by mouth 2 (two) times daily.     ferrous sulfate 325 (65 FE) MG tablet  Take 1 tablet (325 mg total) by mouth 3 (three) times daily with meals.     Fluticasone-Salmeterol 100-50 MCG/DOSE Aepb  Commonly known as:  ADVAIR  Inhale 1 puff into the lungs 2 (two) times daily.     gabapentin 300 MG capsule  Commonly known as:  NEURONTIN  Take 300 mg by mouth 3 (three) times daily.     insulin aspart 100 UNIT/ML injection  Commonly known as:  novoLOG  - Sliding scale- If sugar 150-199: inject 2 unit;  200-249: 4 units, 250-299: 7 units; 300-349: 10 units; Over 350: 12 units  - PENS PLEASE     Insulin Glargine 100 UNIT/ML Solostar Pen  Commonly known as:  LANTUS SOLOSTAR  Inject 50 Units into the skin daily at 10 pm.     labetalol 100 MG tablet  Commonly known  as:  NORMODYNE  Take 1 tablet (100 mg total) by mouth 2 (two) times daily.     levofloxacin 750 MG tablet  Commonly known as:  LEVAQUIN  Take 1 tablet (750 mg total) by mouth daily.     LORazepam 1 MG tablet  Commonly known as:  ATIVAN  Take 1 mg by mouth every 8 (eight) hours as needed for anxiety.     metoCLOPramide 10 MG tablet  Commonly known as:  REGLAN  Take 1 tablet (10 mg total) by mouth every 6 (six) hours.     metroNIDAZOLE 500 MG tablet  Commonly known as:  FLAGYL  Take 1 tablet (500 mg total) by mouth every 12 (twelve) hours.     naproxen sodium 220 MG tablet  Commonly known as:  ANAPROX  Take 440 mg by mouth 2 (two) times daily as needed (pain).     omeprazole 20 MG capsule  Commonly known as:  PRILOSEC  Take 1 capsule (20 mg total) by mouth daily.     oxyCODONE-acetaminophen 5-325 MG per tablet  Commonly known as:  PERCOCET  Take 2 tablets by mouth every 4 (four) hours as needed.     promethazine 25 MG suppository  Commonly known as:  PHENERGAN  Place 1 suppository (25 mg total) rectally every  6 (six) hours as needed for nausea or vomiting.     senna-docusate 8.6-50 MG per tablet  Commonly known as:  Senokot-S  Take 1 tablet by mouth daily.        Disposition and follow-up:   Shirley [name] was discharged from Shirley [name] in Stable condition.  At the [name] follow up visit please address:  1.  Please ensure pt is compliant with diabetic medication and diabetic diet.   2.  Labs / imaging needed at time of follow-up: CBC  3.  Pending labs/ test needing follow-up: HbA1c  4. Please help set pt up for gastric emptying study.  5. Please consider starting pt on asa daily.   6. Pt noted she did well with lyrica but insurance will not pay for it. Has failed gabapentin. Please assist pt w/ meds for her peripheral neuropathy.   Follow-up Appointments:     Follow-up Information    Follow up with Shirley Maxin, DO On 07/24/2014.    Specialty:  Internal Medicine   Why:  at 3:45 pm   Contact information:   Shirley [name] [id number] [phone]       Follow up with Shirley [name].   Specialty:  [name]   Contact information:   [address] [place] [address] [phone]      Procedures Performed:  Dg Chest 2 View  07/15/2014   CLINICAL DATA:  Three-day history of chest pain  EXAM: CHEST  2 VIEW  COMPARISON:  May 05, 2014  FINDINGS: There is no edema or consolidation. Heart size and pulmonary vascularity are within normal limits. No adenopathy. No pneumothorax. No bone lesions.  IMPRESSION: No edema or consolidation.   Electronically Signed   By: Shirley [name] M.D.   On: 07/15/2014 20:44   Dg Abd Acute W/chest  07/20/2014   CLINICAL DATA:  Chronic chest and abdominal pain and vomiting.  EXAM: ACUTE ABDOMEN SERIES (ABDOMEN 2 VIEW & CHEST 1 VIEW)  COMPARISON:  07/15/2014 and prior radiographs.  FINDINGS: The cardiomediastinal silhouette is unremarkable.  There is no evidence of airspace disease, pleural effusion or pneumothorax.  The bowel gas pattern is unremarkable.  There is no evidence of bowel obstruction or pneumoperitoneum.  No suspicious calcifications are identified. No acute bony abnormalities are noted.  IMPRESSION: Negative abdominal radiographs.  No acute cardiopulmonary disease.   Electronically Signed   By: Shirley [name] M.D.   On: 07/20/2014 20:07     Admission HPI: Shirley [name] is a 39 year old female with chronic abdominal pain, poorly controlled type 2 diabetes complain by neuropathy and gastroparesis, hypertension, hyperlipidemia, coronary artery disease status post angioplasty, morbid obesity, history of CVA, obstructive sleep apnea, polysubstance use [cocaine, THC] who presented to the ED with nausea, vomiting, chest pain.  Day before yesterday, she reports feeling "sick." She describes this as feeling clammy, sweaty with associated  stomachache and mostly nonbloody, nonbilious vomiting. She also reports having associated chest pain along the right side which is reproducible with touch is described as a tightness/squeezing sensation that usually worsens with vomiting but resolves on its own intermittently. She has been unable to tolerate oral intake since the day before symptoms began. She took Phenergan though it did not relieve her symptoms. She denies missing any of her medications, including Reglan, and reports that her sugars normally run in the 200s. On 3/14, she reports using cocaine and marijuana at her daughter's birthday. Otherwise, she denies any recent NSAID  use, eating anything out of the ordinary, fever, chills, sick contacts, recent tobacco or illicit drug use. She has baseline left lower quadrant abdominal pain. She was hospitalized in December 2015 for similar symptoms at which time EGD was performed with findings suggestive of gastroparesis though she refused to cooperate with gastric emptying scan to confirm the diagnosis. Biopsy findings are consistent with active gastritis. She was discharged on Reglan 10 mg every 6 hours and Prilosec 20 mg though did not follow-up with GI. Since then, she has had multiple ED visits for similar complaints as well. In the ED, she was given Haldol 2 mg IV, Reglan 10 mg IV, Zofran 4 mg IV, 2 L normal saline bolus, Tylenol 650 mg rectal suppository, Dilaudid 1 mg IV. Abdominal and chest x-ray were otherwise unremarkable.   [name] Course by problem list: Principal Problem:   Tachycardia Active Problems:   Poorly controlled type 2 diabetes mellitus with peripheral neuropathy   Hyperlipidemia   Polysubstance abuse   Essential hypertension   Leukocytosis   Diabetic gastroparesis associated with type 2 diabetes mellitus   Left lower quadrant pain    Tachycardia--Pt had persistent sinus tachycardia and was not symptomatic. States that she always has a fast heart rate regardless of  activity or at rest. Last time she used cocaine was 1-2 weeks ago. Troponin x 1 neg, EKG sinus rhythm. Likely tachycardia due to autonomic dysfunction due to poor glycemic control as evidenced by her HbA1c of 11.1 on 3/29, peripehral neuropathy, and gastroparesis.   Acute on chronic abdominal pain: Likely secondary to diabetic gastroparesis in the setting of her poorly controlled diabetes though cocaine is also noted to delayed gastric emptying. Gastritis may be contributory as noted on multiple prior endoscopies. LFTs and lipase unremarkable for hepatobiliary process. Etiology of her left lower quadrant pain has been evaluated with multiple CT scans and GI workups though may be related to her menses as she reports passing heavy clots during the first 4 days of her cycle. She is sexually active though has never had any prior history of STI's nor has her single female partner; she did deny vaginal discharge. Continued Reglan 10 mg every 6 hours and started on NS at 100cc/hr. Phenergan was held due to prolonged QTc on admission, QTc 448 on discharge. Next day of admission pt tolerated breakfast and lunch and felt ready to go home. Advised she will need better diabetes control and advised to stop THC/cocaine. Will need to f/u with GI for hepatitis and for gastric emptying study.   Atypical chest pain: Likely associated with vomiting and gastric in nature though cannot exclude cardiac given her prior history of cardiac disease as well as recent cocaine use. Reproducibility and description of pain are less suggestive of cardiac chest pain. Trop x 1 neg, denied any chest pain on discharge. HR down to 115 on discharge, pt states she always has a fast heart rate, also possibly due to recent cocaine use as UDS was positive for cocaine.   Poorly controlled Type 2 diabetes with neuropathy: A1c 10.3, 02/10/2014. Glucose on admission 252. At her most recent hospitalization, she was discharged on Lantus 50 units with  titration as outpatient that she did not follow-up. Other medications include gabapentin 3 mg 3 times daily. HbA1c 11.1 on 3/29.   Coronary artery disease status post angioplasty: Angioplasty of occluded PDA performed in February 2008. Also history of CVA ( Remote right cerebellar infarct noted on head CT 06/20/2012.) Can consider starting on ASA  as outpatient.   Polysubstance use [cocaine, THC]: No records per [place] for past 6 months. Advised her against use given her multiple medical comorbidities.  Leukocytosis: WBC 15.9, baseline 11-16. Per chart review, she has been evaluated by hematology who felt is related to her chronic hepatitis. She does have a history of hepatitis B infection with HBsAg+ when last checked November 2012 though HCV & HBVeAg both unremarkable. No changes in her LFTs. Ab CT 12/31/13 without any identified changes to liver. WBC 15.1 morning of d/c, will recommend CBC on discharge.  Also recommend GI f/u on d/c.   Thrombocytosis: Platelets 454, baseline 400s to 600s. Thought to be related to iron deficiency. Anemia panel 04/22/2014 with iron less than 10, ferritin 15, TIBC incalculable. She is not anemic with hemoglobin 14.0 on admission. At the time of discharge her most recent hospitalization, she was put on ferrous sulfate 325 mg 3 times daily with meals. Platelets trending down, plts 412 next day of admission can f/u with CBC as outpatient.   Discharge Vitals:   BP 127/88 mmHg  Pulse 115  Temp(Src) 98.1 F (36.7 C) (Oral)  Resp 18  Ht 5\' 7"  (1.702 m)  Wt 203 lb 7.8 oz (92.3 kg)  BMI 31.86 kg/m2  SpO2 100%  LMP 06/28/2014  Discharge Labs:  Results for orders placed or performed during the [name] encounter of 07/20/14 (from the past 24 hour(s))  Comprehensive metabolic panel     Status: Abnormal   Collection Time: 07/20/14  4:24 PM  Result Value Ref Range   Sodium 135 135 - 145 mmol/L   Potassium 3.8 3.5 - 5.1 mmol/L   Chloride 101 96 - 112  mmol/L   CO2 21 19 - 32 mmol/L   Glucose, Bld 252 (H) 70 - 99 mg/dL   BUN 10 6 - 23 mg/dL   Creatinine, Ser 0.90 0.50 - 1.10 mg/dL   Calcium 9.4 8.4 - 10.5 mg/dL   Total Protein 8.7 (H) 6.0 - 8.3 g/dL   Albumin 3.9 3.5 - 5.2 g/dL   AST 18 0 - 37 U/L   ALT 8 0 - 35 U/L   Alkaline Phosphatase 70 39 - 117 U/L   Total Bilirubin 0.5 0.3 - 1.2 mg/dL   GFR calc non Af Amer 79 (L) >90 mL/min   GFR calc Af Amer >90 >90 mL/min   Anion gap 13 5 - 15  Lipase, blood     Status: None   Collection Time: 07/20/14  4:24 PM  Result Value Ref Range   Lipase 18 11 - 59 U/L  CBC with Differential     Status: Abnormal   Collection Time: 07/20/14  4:24 PM  Result Value Ref Range   WBC 15.9 (H) 4.0 - 10.5 K/uL   RBC 5.41 (H) 3.87 - 5.11 MIL/uL   Hemoglobin 14.0 12.0 - 15.0 g/dL   HCT 41.1 36.0 - 46.0 %   MCV 76.0 (L) 78.0 - 100.0 fL   MCH 25.9 (L) 26.0 - 34.0 pg   MCHC 34.1 30.0 - 36.0 g/dL   RDW 17.1 (H) 11.5 - 15.5 %   Platelets 454 (H) 150 - 400 K/uL   Neutrophils Relative % 77 43 - 77 %   Neutro Abs 12.3 (H) 1.7 - 7.7 K/uL   Lymphocytes Relative 17 12 - 46 %   Lymphs Abs 2.6 0.7 - 4.0 K/uL   Monocytes Relative 5 3 - 12 %   Monocytes Absolute 0.8 0.1 -  1.0 K/uL   Eosinophils Relative 1 0 - 5 %   Eosinophils Absolute 0.2 0.0 - 0.7 K/uL   Basophils Relative 0 0 - 1 %   Basophils Absolute 0.0 0.0 - 0.1 K/uL  I-stat troponin, ED (only if pt is 41 y.o. or older & pain is above umbilicus) - do not order at [facility]     Status: None   Collection Time: 07/20/14  4:28 PM  Result Value Ref Range   Troponin i, poc 0.00 0.00 - 0.08 ng/mL   Comment 3          Urine rapid drug screen (hosp performed)     Status: Abnormal   Collection Time: 07/20/14  5:49 PM  Result Value Ref Range   Opiates NONE DETECTED NONE DETECTED   Cocaine POSITIVE (A) NONE DETECTED   Benzodiazepines NONE DETECTED NONE DETECTED   Amphetamines NONE DETECTED NONE DETECTED   Tetrahydrocannabinol POSITIVE (A) NONE DETECTED    Barbiturates NONE DETECTED NONE DETECTED  Urinalysis, Routine w reflex microscopic     Status: Abnormal   Collection Time: 07/20/14  5:49 PM  Result Value Ref Range   Color, Urine YELLOW YELLOW   APPearance CLOUDY (A) CLEAR   Specific Gravity, Urine 1.025 1.005 - 1.030   pH 7.0 5.0 - 8.0   Glucose, UA >1000 (A) NEGATIVE mg/dL   Hgb urine dipstick NEGATIVE NEGATIVE   Bilirubin Urine NEGATIVE NEGATIVE   Ketones, ur 40 (A) NEGATIVE mg/dL   Protein, ur 30 (A) NEGATIVE mg/dL   Urobilinogen, UA 1.0 0.0 - 1.0 mg/dL   Nitrite NEGATIVE NEGATIVE   Leukocytes, UA NEGATIVE NEGATIVE  Urine microscopic-add on     Status: Abnormal   Collection Time: 07/20/14  5:49 PM  Result Value Ref Range   Squamous Epithelial / LPF FEW (A) RARE   WBC, UA 0-2 <3 WBC/hpf   RBC / HPF 0-2 <3 RBC/hpf   Bacteria, UA FEW (A) RARE   Casts HYALINE CASTS (A) NEGATIVE   Urine-Other MUCOUS PRESENT   Glucose, capillary     Status: Abnormal   Collection Time: 07/21/14  1:58 AM  Result Value Ref Range   Glucose-Capillary 222 (H) 70 - 99 mg/dL  Troponin I     Status: None   Collection Time: 07/21/14  5:02 AM  Result Value Ref Range   Troponin I 0.03 <0.031 ng/mL  Basic metabolic panel     Status: Abnormal   Collection Time: 07/21/14  5:02 AM  Result Value Ref Range   Sodium 134 (L) 135 - 145 mmol/L   Potassium 3.8 3.5 - 5.1 mmol/L   Chloride 106 96 - 112 mmol/L   CO2 20 19 - 32 mmol/L   Glucose, Bld 204 (H) 70 - 99 mg/dL   BUN 13 6 - 23 mg/dL   Creatinine, Ser 0.95 0.50 - 1.10 mg/dL   Calcium 8.7 8.4 - 10.5 mg/dL   GFR calc non Af Amer 74 (L) >90 mL/min   GFR calc Af Amer 86 (L) >90 mL/min   Anion gap 8 5 - 15  CBC     Status: Abnormal   Collection Time: 07/21/14  5:07 AM  Result Value Ref Range   WBC 15.1 (H) 4.0 - 10.5 K/uL   RBC 4.71 3.87 - 5.11 MIL/uL   Hemoglobin 12.3 12.0 - 15.0 g/dL   HCT 35.8 (L) 36.0 - 46.0 %   MCV 76.0 (L) 78.0 - 100.0 fL   MCH 26.1 26.0 - 34.0 pg  MCHC 34.4 30.0 - 36.0 g/dL    RDW 17.2 (H) 11.5 - 15.5 %   Platelets 412 (H) 150 - 400 K/uL  Glucose, capillary     Status: Abnormal   Collection Time: 07/21/14  6:29 AM  Result Value Ref Range   Glucose-Capillary 193 (H) 70 - 99 mg/dL   Comment 1 Notify RN    Comment 2 Document in Chart   Glucose, capillary     Status: Abnormal   Collection Time: 07/21/14 11:04 AM  Result Value Ref Range   Glucose-Capillary 319 (H) 70 - 99 mg/dL   Comment 1 Repeat Test    Comment 2 Document in Chart     Signed: Norman Herrlich, MD 07/21/2014, 1:14 PM    Services Ordered on Discharge: none  Equipment Ordered on Discharge: none

## 2014-07-21 NOTE — H&P (Signed)
Date: 07/21/2014               Patient Name:  Shirley [name] MRN: [id number]  DOB: 10/24/1975 Age / Sex: 39 y.o., female   PCP: Luan Moore, MD         Medical Service: Internal Medicine Teaching Service         Attending Physician: Dr. Madilyn Fireman, MD    First Contact: Dr. Julious Oka Pager: [phone]  Second Contact: Dr. Joni Reining Pager: [phone]       After Hours (After 5p/  First Contact Pager: [phone]  weekends / holidays): Second Contact Pager: [phone]   Chief Complaint: Nausea, vomiting, chest pain  History of Present Illness: Shirley [name] is a 39 year old female with chronic abdominal pain, poorly controlled type 2 diabetes complain by neuropathy and gastroparesis, hypertension, hyperlipidemia, coronary artery disease status post angioplasty, morbid obesity, history of CVA, obstructive sleep apnea, polysubstance use [cocaine, THC] who presented to the ED with nausea, vomiting, chest pain.  Day before yesterday, she reports feeling "sick." She describes this as feeling clammy, sweaty with associated stomachache and mostly nonbloody, nonbilious vomiting. She also reports having associated chest pain along the right side which is reproducible with touch is described as a tightness/squeezing sensation that usually worsens with vomiting but resolves on its own intermittently. She has been unable to tolerate oral intake since the day before symptoms began. She took Phenergan though it did not relieve her symptoms. She denies missing any of her medications, including Reglan, and reports that her sugars normally run in the 200s. On 3/14, she reports using cocaine and marijuana at her daughter's birthday. Otherwise, she denies any recent NSAID use, eating anything out of the ordinary, fever, chills, sick contacts, recent tobacco or illicit drug use. She has baseline left lower quadrant abdominal pain. She was hospitalized in December 2015 for similar symptoms at which time EGD was  performed with findings suggestive of gastroparesis though she refused to cooperate with gastric emptying scan to confirm the diagnosis. Biopsy findings are consistent with active gastritis. She was discharged on Reglan 10 mg every 6 hours and Prilosec 20 mg though did not follow-up with GI. Since then, she has had multiple ED visits for similar complaints as well. In the ED, she was given Haldol 2 mg IV, Reglan 10 mg IV, Zofran 4 mg IV, 2 L normal saline bolus, Tylenol 650 mg rectal suppository, Dilaudid 1 mg IV. Abdominal and chest x-ray were otherwise unremarkable.     Meds: No current facility-administered medications for this encounter.    Allergies: Allergies as of 07/20/2014 - Review Complete 07/20/2014  Allergen Reaction Noted  . Lisinopril Nausea Only 03/17/2008  . Morphine and related Itching 04/15/2013   Past Medical History  Diagnosis Date  . Thrombocytosis     Hem/Onc suggested 2/2 chronic hepatits and/or iron deficiency anemia  . Iron deficiency anemia   . N&V (nausea and vomiting)     Chronic. Unclear etiology with multiple admission and ED visits. CT abdomen with and without contrast (02/2011)  showed no acute process. Gastic Emptying scan (01/2010) was normal. Ultrasound of the abdomen was within normal limits. Hepatitis B viral load was undectable. HIV NR. EGD - gastritis, Hpylori + s/p Rx  . Hypertension   . Hyperlipidemia   . Diabetes mellitus type 2, uncontrolled, with complications   . Recurrent boils   . CAD (coronary artery disease) 06/15/2006    s/p Subendocardial MI with PDA angioplasty(no stent) on 06/15/06  and relook  cath 06/19/06 showed patency of site. Cath 12/10- no restenosis or significant CAD progression  . Irregular menses     Small ovarian follicles seen on TG(6/26)  . History of pyelonephritis     H/o GrpB Pyelonephritis (9/06) and UTI- 07/11- E.Coli, 12/10- GBS  . Abscess of tunica vaginalis     10/09- Abundant S. aureus- sensitive to all abx  .  Obesity   . Gastritis   . Depression   . Peripheral neuropathy   . Fibromyalgia   . GERD (gastroesophageal reflux disease)   . Anxiety   . Gastroparesis     secondary to poorly controlled DM, last emptying study performed 01/2010  was normal but may be falsely positive as pt was on reglan  . Blood dyscrasia   . Substance abuse   . MI (myocardial infarction) 2008  . OSA (obstructive sleep apnea)     "suppose to wear mask but I don't" (04/22/2014)  . Seasonal asthma   . Hepatitis B, chronic     Hep BeAb+,Hep B cAb+ & Hep BsAg+ (9/06)  . Migraine     "weekly" (04/22/2014)  . CVA (cerebral vascular accident) ~ 02/2014    denies residual on 04/22/2014  . CVA (cerebral vascular accident)     history of remote right cerebellar infarct noted on head CT at least since 10/2011  . Pneumonia     "this is probably the 2nd or 3rd time I've had pneumonia" (04/22/2014)   Past Surgical History  Procedure Laterality Date  . Cesarean section  1997  . Coronary angioplasty with stent placement  2008    "2 stents"  . Esophagogastroduodenoscopy N/A 04/23/2014    Procedure: ESOPHAGOGASTRODUODENOSCOPY (EGD);  Surgeon: Winfield Cunas., MD;  Location: [facility] ENDOSCOPY;  Service: Endoscopy;  Laterality: N/A;   Family History  Problem Relation Age of Onset  . Diabetes Father    History   Social History  . Marital Status: Single    Spouse Name: N/A  . Number of Children: 2  . Years of Education: 11   Occupational History  . other     unemployed   Social History Main Topics  . Smoking status: Former Smoker    Types: Cigarettes    Quit date: 04/24/1996  . Smokeless tobacco: Never Used     Comment: quit smoking cigarettes age 1  . Alcohol Use: Yes     Comment: 04/22/2014 "might have a few drinks a month"  . Drug Use: Yes    Special: Marijuana, Cocaine     Comment: 04/22/2104 "quit drugs ~ 1-2 yr ago"  . Sexual Activity: Yes   Other Topics Concern  . 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   Also works at [employer] having to lift elderly individuals.          Review of Systems: As noted the history of present illness  Physical Exam: Blood pressure 130/92, pulse 131, temperature 98.3 F (36.8 C), temperature source Oral, resp. rate 18, height 5\' 7"  (1.702 m), weight 203 lb 7.8 oz (92.3 kg), last menstrual period 06/28/2014, SpO2 100 %. General: resting in bed, obese, malodorous African-American female, NAD HEENT: PERRL, EOMI, no scleral icterus, oropharynx with dry mucous membranes Cardiac: Sinus tachycardia, no rubs, murmurs or gallops Pulm: clear to auscultation bilaterally, no wheezes, rales, or rhonchi Abd: soft, nontender, nondistended, bowel sounds  unable to be auscultated, no rebound tenderness Ext: warm and well perfused, no pedal or tibial edema Neuro: responds to questions appropriately; moving all extremities freely   Lab results: Basic Metabolic Panel:  Recent Labs  07/20/14 1624  NA 135  K 3.8  CL 101  CO2 21  GLUCOSE 252*  BUN 10  CREATININE 0.90  CALCIUM 9.4   Liver Function Tests:  Recent Labs  07/20/14 1624  AST 18  ALT 8  ALKPHOS 70  BILITOT 0.5  PROT 8.7*  ALBUMIN 3.9    Recent Labs  07/20/14 1624  LIPASE 18   CBC:  Recent Labs  07/20/14 1624  WBC 15.9*  NEUTROABS 12.3*  HGB 14.0  HCT 41.1  MCV 76.0*  PLT 454*    CBG:  Recent Labs  07/21/14 0158  GLUCAP 222*   Urine Drug Screen: Drugs of Abuse     Component Value Date/Time   LABOPIA NONE DETECTED 07/20/2014 1749   LABOPIA NEGATIVE 02/21/2010 2201   COCAINSCRNUR POSITIVE* 07/20/2014 [place] 02/21/2010 2201   LABBENZ NONE DETECTED 07/20/2014 1749   LABBENZ NEGATIVE 02/21/2010 2201   AMPHETMU NONE DETECTED 07/20/2014 1749   AMPHETMU NEGATIVE 02/21/2010 2201   THCU POSITIVE* 07/20/2014 1749   LABBARB NONE DETECTED 07/20/2014 1749     Urinalysis:  Recent Labs  07/20/14 1749  COLORURINE YELLOW  LABSPEC 1.025  PHURINE 7.0  GLUCOSEU >1000*  HGBUR NEGATIVE  BILIRUBINUR NEGATIVE  KETONESUR 40*  PROTEINUR 30*  UROBILINOGEN 1.0  NITRITE NEGATIVE  LEUKOCYTESUR NEGATIVE     Imaging results:  Dg Abd Acute W/chest  07/20/2014   CLINICAL DATA:  Chronic chest and abdominal pain and vomiting.  EXAM: ACUTE ABDOMEN SERIES (ABDOMEN 2 VIEW & CHEST 1 VIEW)  COMPARISON:  07/15/2014 and prior radiographs.  FINDINGS: The cardiomediastinal silhouette is unremarkable.  There is no evidence of airspace disease, pleural effusion or pneumothorax.  The bowel gas pattern is unremarkable.  There is no evidence of bowel obstruction or pneumoperitoneum.  No suspicious calcifications are identified. No acute bony abnormalities are noted.  IMPRESSION: Negative abdominal radiographs.  No acute cardiopulmonary disease.   Electronically Signed   By: Margarette Canada M.D.   On: 07/20/2014 20:07    Other results: EKG: Reviewed and compared with 07/15/14 -Sinus tachycardia -Baseline wander multiple leads -Q waves noted in lead 3, V5, V6 on prior EKG -Left atrial enlargement is noted by diphasic P waves in leads 2, 3, aVF on prior EKG   Assessment & Plan by Problem:  Acute on chronic abdominal pain: Likely secondary to diabetic gastroparesis in the setting of her poorly controlled diabetes though cocaine is also noted to delayed gastric emptying. Gastritis may be contributory as noted on multiple prior endoscopies. LFTs and lipase unremarkable for hepatobiliary process. Etiology of her left lower quadrant pain has been evaluated with multiple CT scans and GI workups though may be related to her menses as she reports passing heavy clots during the first 4 days of her cycle. She is sexually active though has never had any prior history of STI's nor has her single female partner; she did deny vaginal discharge. Following her most recent auscultation, she was  to be referred to OB/GYN. She is hemodynamically stable which is reassuring. -Continue Reglan 10 mg every 6 hours -Defer antiemetics for now given prolonged QTC and recheck EKG -Give normal saline at 100 mL/hour 8 hours -Check HIV -Recheck BMET, CBC tomorrow morning  Atypical chest pain: Likely  associated with vomiting and does gastric in nature though cannot exclude cardiac given her prior history of cardiac disease as well as recent cocaine use. Reproducibility and description of pain are less suggestive of cardiac chest pain.  -Check troponin  Poorly controlled Type 2 diabetes with neuropathy: A1c 10.3, 02/10/2014. Glucose on admission 252. At her most recent hospitalization, she was discharged on Lantus 50 units with titration as outpatient that she did not follow-up. Other medications include gabapentin 3 mg 3 times daily. -Continue gabapentin -Check A1c -Start moderate sliding scale insulin  Coronary artery disease status post angioplasty: Angioplasty of occluded PDA performed in February 2008. Unsure of why she is not on aspirin. -Consider initiating pending resolution of acute illness  History of CVA: Remote right cerebellar infarct noted on head CT 06/20/2012. Unsure why she is not on ASA 81mg .  Hypertension: Home medications include labetalol 100 mg twice daily. -Hold pending resolution of acute illness  Hyperlipidemia: Lipid panel 07/21/2013 notable for total cholesterol 169, LDL 108, HDL 41, triglycerides 100. Her medications include Lipitor 80 mg. -Hold pending resolution of acute illness  Asthma: Home medications include albuterol inhaler, Advair. -Continue albuterol nebulizer every 4 hours as needed for wheezing -Start Dulera 2 puffs twice daily  Obstructive sleep apnea: No sleep study on file though she does report undergoing it. She does not report wearing CPAP regularly. -Defer CPAP for now  Polysubstance use [cocaine, THC]: No records per [place]  for past 6 months. -Advised her against use given her multiple medical comorbidities  Leukocytosis: WBC 15.9, baseline 11-16. Per chart review, she has been evaluated by hematology who felt is related to her chronic hepatitis. She does have a history of hepatitis B infection with HBsAg+ when last checked November 2012 though HCV & HBVeAg both unremarkable. No changes in her LFTs. Ab CT 12/31/13 without any identified changes to liver.  -Continue following  Thrombocytosis: Platelets 454, baseline 400s to 600s. Thought to be related to iron deficiency. Anemia panel 04/22/2014 with iron less than 10, ferritin 15, TIBC incalculable. She is not anemic with hemoglobin 14.0 on admission. At the time of discharge her most recent hospitalization, she was put on ferrous sulfate 325 mg 3 times daily with meals. -Continue following  Morbid obesity: Admission weight 203 pounds, BMI 31.9. -Advise weight loss and other lifestyle modification  #FEN:  -Diet: Nothing by mouth  #DVT prophylaxis: Heparin  #CODE STATUS: FULL CODE   Dispo: Disposition is deferred at this time, awaiting improvement of current medical problems.  The patient does have a current PCP Luan Moore, MD) and does need an [facility] hospital follow-up appointment after discharge.  The patient does not know have transportation limitations that hinder transportation to clinic appointments.  Signed: Riccardo Dubin, MD 07/21/2014, 2:10 AM

## 2014-07-21 NOTE — Progress Notes (Signed)
Pt. Was discharged, but discharge was canceled due to patient having tachycardia.

## 2014-07-22 DIAGNOSIS — E1140 Type 2 diabetes mellitus with diabetic neuropathy, unspecified: Secondary | ICD-10-CM | POA: Diagnosis not present

## 2014-07-22 DIAGNOSIS — R000 Tachycardia, unspecified: Secondary | ICD-10-CM | POA: Diagnosis not present

## 2014-07-22 DIAGNOSIS — Z794 Long term (current) use of insulin: Secondary | ICD-10-CM

## 2014-07-22 DIAGNOSIS — J45909 Unspecified asthma, uncomplicated: Secondary | ICD-10-CM

## 2014-07-22 DIAGNOSIS — Z7951 Long term (current) use of inhaled steroids: Secondary | ICD-10-CM

## 2014-07-22 DIAGNOSIS — E1165 Type 2 diabetes mellitus with hyperglycemia: Secondary | ICD-10-CM | POA: Diagnosis not present

## 2014-07-22 DIAGNOSIS — E1143 Type 2 diabetes mellitus with diabetic autonomic (poly)neuropathy: Secondary | ICD-10-CM | POA: Insufficient documentation

## 2014-07-22 DIAGNOSIS — D72829 Elevated white blood cell count, unspecified: Secondary | ICD-10-CM | POA: Diagnosis not present

## 2014-07-22 DIAGNOSIS — F1210 Cannabis abuse, uncomplicated: Secondary | ICD-10-CM

## 2014-07-22 DIAGNOSIS — Z9861 Coronary angioplasty status: Secondary | ICD-10-CM

## 2014-07-22 LAB — GLUCOSE, CAPILLARY
Glucose-Capillary: 164 mg/dL — ABNORMAL HIGH (ref 70–99)
Glucose-Capillary: 261 mg/dL — ABNORMAL HIGH (ref 70–99)
Glucose-Capillary: 430 mg/dL — ABNORMAL HIGH (ref 70–99)

## 2014-07-22 LAB — HEMOGLOBIN A1C
Hgb A1c MFr Bld: 11.1 % — ABNORMAL HIGH (ref 4.8–5.6)
MEAN PLASMA GLUCOSE: 272 mg/dL

## 2014-07-22 IMAGING — CT CT ABD-PELV W/ CM
1 of 2 series · 15 of 32 positions shown, 19 images · IV contrast (OMNIPAQUE 300)
Comparison: 11/23/2011

CLINICAL DATA: Abdominal pain

CT ABDOMEN AND PELVIS WITH CONTRAST
TECHNIQUE: Multidetector CT imaging of the abdomen and pelvis was
performed following the standard protocol during bolus
administration of intravenous contrast.
Contrast: 100mL OMNIPAQUE IOHEXOL 300 MG/ML  SOLN

[Series 2: abd/pel with · axial · 0.80mm/px · z∈[-471,-26]mm · 15 of 97 slices shown, 19 images]
[im 4/97  soft-tissue]
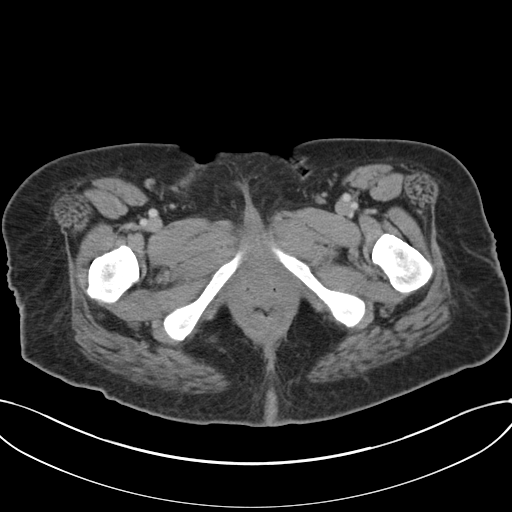
[im 4/97  bone]
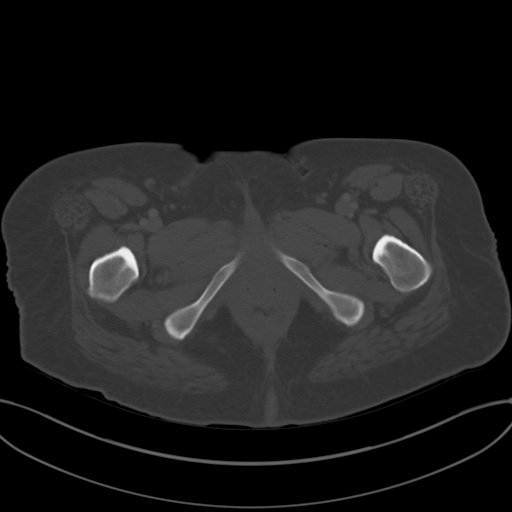
[im 12/97  soft-tissue]
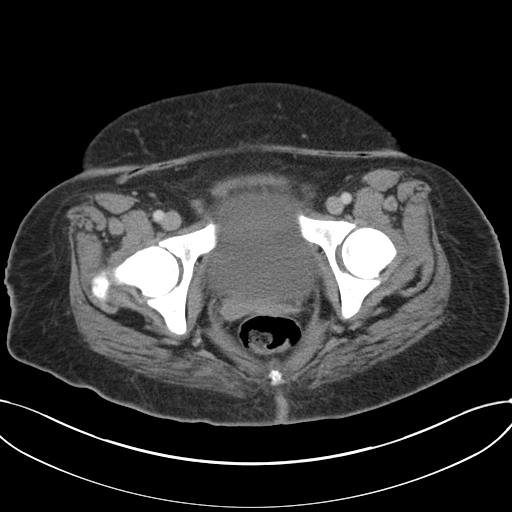
[im 19/97  soft-tissue]
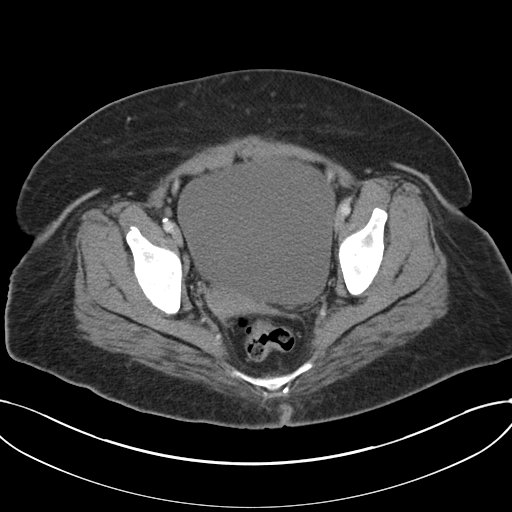
[im 26/97  soft-tissue]
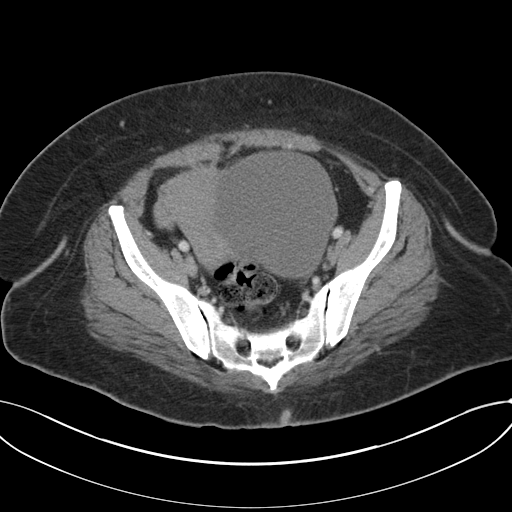
[im 34/97  soft-tissue]
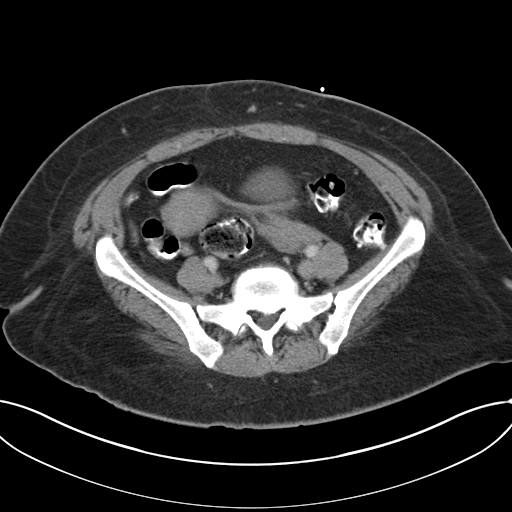
[im 41/97  soft-tissue]
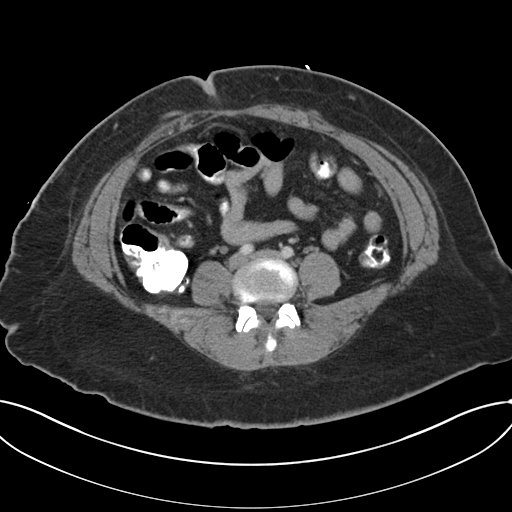
[im 49/97  soft-tissue]
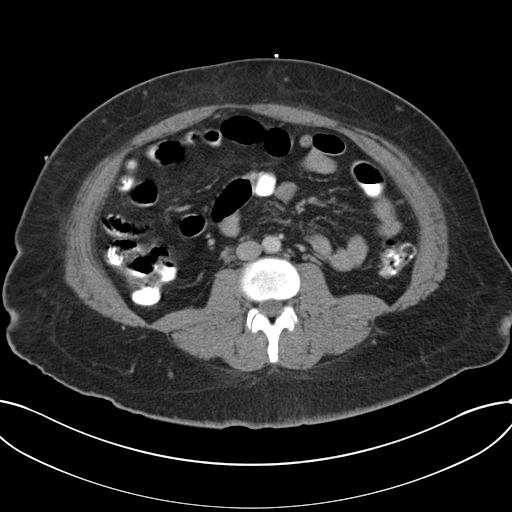
[im 56/97  soft-tissue]
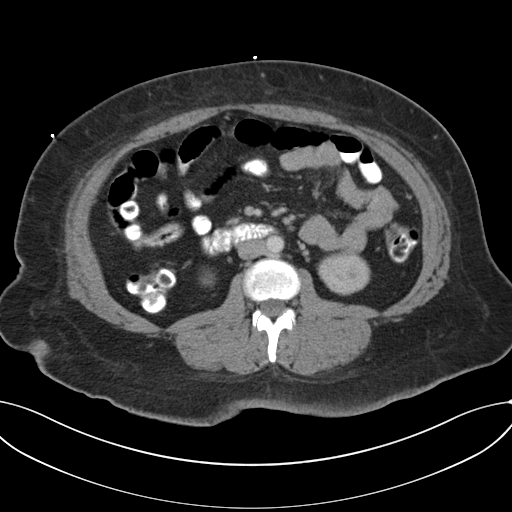
[im 63/97  soft-tissue]
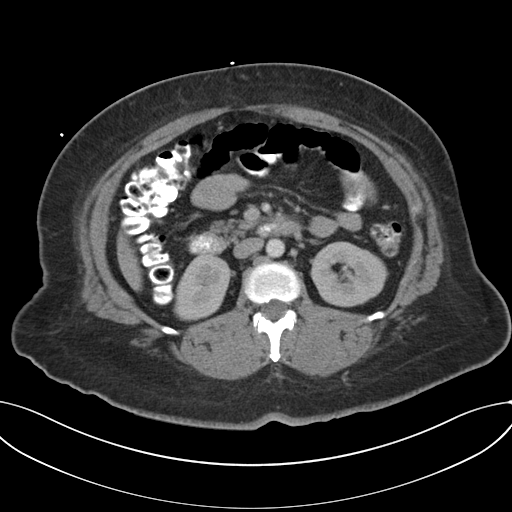
[im 63/97  bone]
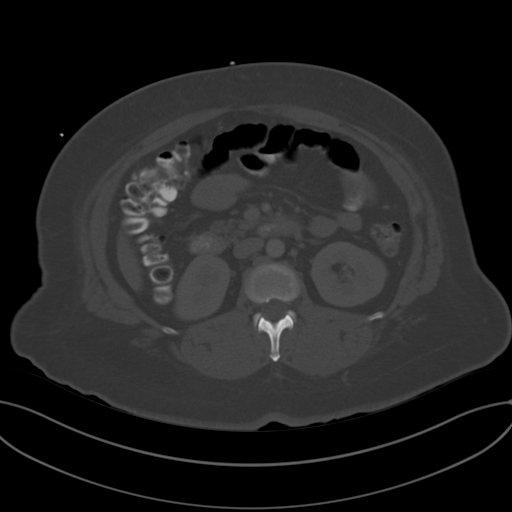
[im 71/97  soft-tissue]
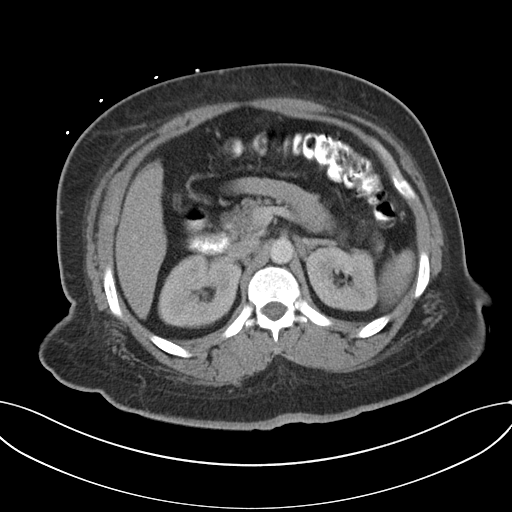
[im 78/97  soft-tissue]
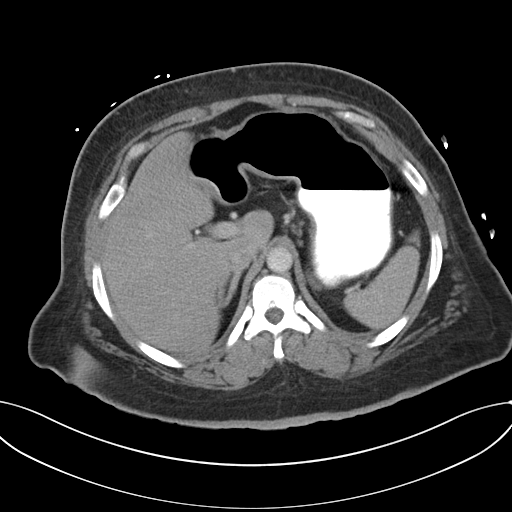
[im 82/97  lung]
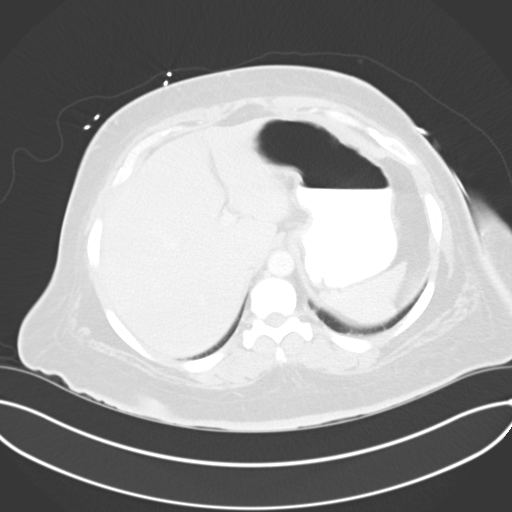
[im 85/97  soft-tissue]
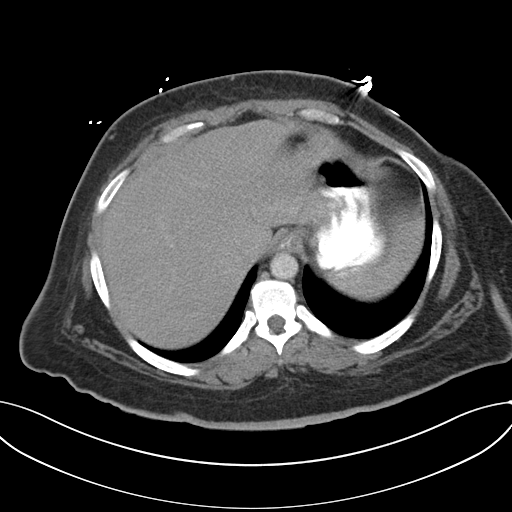
[im 85/97  lung]
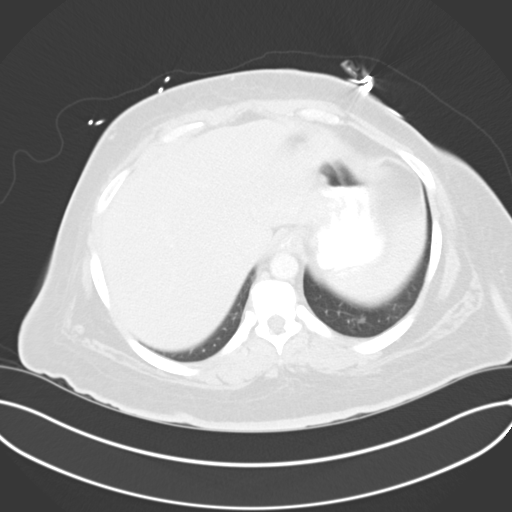
[im 89/97  lung]
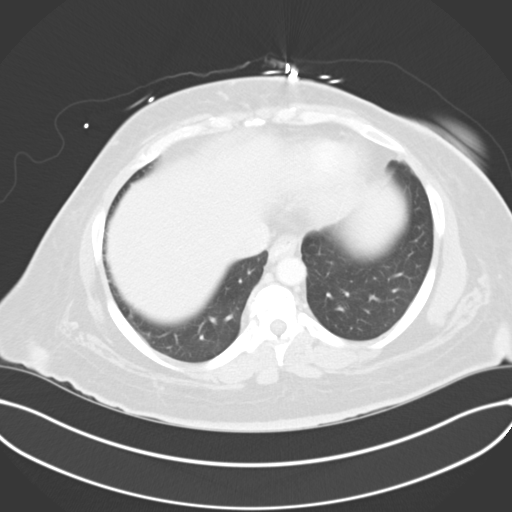
[im 93/97  soft-tissue]
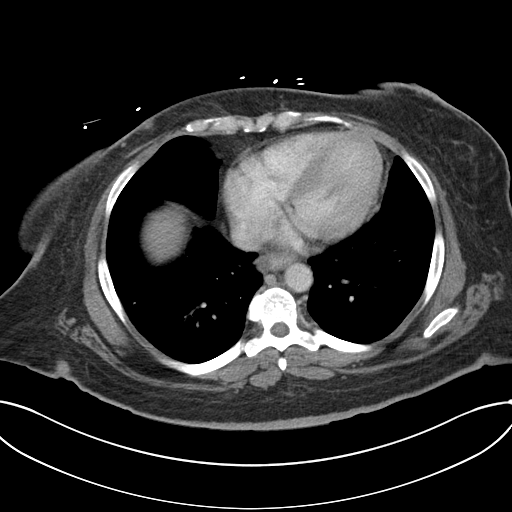
[im 93/97  lung]
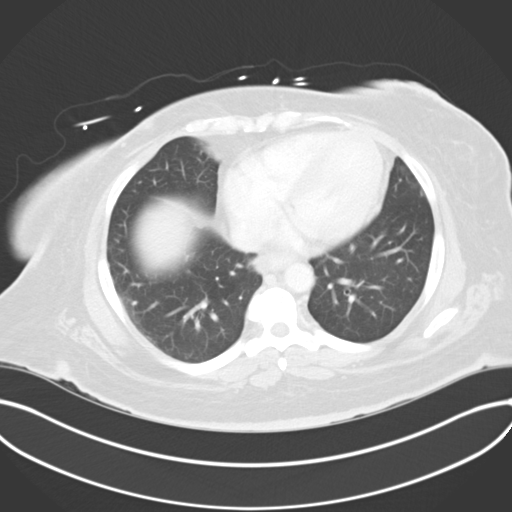

[15 of 32 positions shown; findings below may reference images not displayed]

FINDINGS: Limited images through the lung bases demonstrate no
significant appreciable abnormality. The heart size is within
normal limits. No pleural or pericardial effusion. Distal
esophageal wall thickening.

Low attenuation of the liver is nonspecific post contrast however
suggests fatty infiltration.  Unremarkable biliary system, spleen,
pancreas, adrenal glands.

Symmetric renal enhancement.  No hydronephrosis or hydroureter.

No bowel obstruction.  No CT evidence for colitis.  Normal
appendix.  No free intraperitoneal air or fluid.  No
lymphadenopathy.

Normal caliber vasculature.  Markedly distended, thin-walled
bladder.  Displaces the uterus rightward.  Nonspecific appearance
to the uterus. No acute osseous finding.
IMPRESSION: Low attenuation of the liver suggests fatty infiltration.

Marked bladder distension with displacement of the uterus
rightward.  If there is concern for pelvic pathology, ultrasound
should be considered.

Distal esophageal wall thickening may reflect gastroesophageal
reflux disease or esophagitis.

## 2014-07-22 MED ORDER — INSULIN ASPART 100 UNIT/ML ~~LOC~~ SOLN
2.0000 [IU] | Freq: Once | SUBCUTANEOUS | Status: AC
  Administered 2014-07-22: 2 [IU] via SUBCUTANEOUS

## 2014-07-22 MED ORDER — LABETALOL HCL 100 MG PO TABS
100.0000 mg | ORAL_TABLET | Freq: Two times a day (BID) | ORAL | Status: DC
  Administered 2014-07-22: 100 mg via ORAL
  Filled 2014-07-22 (×2): qty 1

## 2014-07-22 NOTE — Progress Notes (Signed)
Subjective: Pt denies n/v. States she has LLQ abdominal pain that is chronic and at her baseline. States she has peripheral neuropathy that was controlled well with lyrica however her insurance will not cover it and gabapentin has not helped. Has hx of elevated HR despite activity or rest. Denies SOB and chest pain.   Objective: Vital signs in last 24 hours: Filed Vitals:   07/21/14 2300 07/22/14 0000 07/22/14 0546 07/22/14 0600  BP: 132/80  140/94   Pulse: 99  121   Temp: 98.1 F (36.7 C)  98.4 F (36.9 C)   TempSrc: Oral  Oral   Resp: 18 19 18 17   Height:      Weight:   204 lb (92.534 kg)   SpO2: 97%  97%    Weight change: 8.3 oz (0.234 kg)  Intake/Output Summary (Last 24 hours) at 07/22/14 0829 Last data filed at 07/21/14 1300  Gross per 24 hour  Intake    840 ml  Output    300 ml  Net    540 ml   General: NAD Lungs: Clear on anterior auscultation Cardiac: tachycardic, no murmurs GI: soft, active bowel sounds, TTP of left lower quadrant, no guarding Neuro: CN II-XII grossly intact Ext: neg for pedal edema  Lab Results: Basic Metabolic Panel:  Recent Labs Lab 07/20/14 1624 07/21/14 0502  NA 135 134*  K 3.8 3.8  CL 101 106  CO2 21 20  GLUCOSE 252* 204*  BUN 10 13  CREATININE 0.90 0.95  CALCIUM 9.4 8.7   Liver Function Tests:  Recent Labs Lab 07/20/14 1624  AST 18  ALT 8  ALKPHOS 70  BILITOT 0.5  PROT 8.7*  ALBUMIN 3.9    Recent Labs Lab 07/15/14 1839 07/20/14 1624  LIPASE 34 18   CBC:  Recent Labs Lab 07/15/14 1839  07/20/14 1624 07/21/14 0507  WBC 15.3*  --  15.9* 15.1*  NEUTROABS 10.3*  --  12.3*  --   HGB 13.5  < > 14.0 12.3  HCT 37.3  < > 41.1 35.8*  MCV 74.0*  --  76.0* 76.0*  PLT 431*  --  454* 412*  < > = values in this interval not displayed. Cardiac Enzymes:  Recent Labs Lab 07/21/14 0502  TROPONINI 0.03   CBG:  Recent Labs Lab 07/21/14 0629 07/21/14 1104 07/21/14 1632 07/21/14 2125 07/22/14 07/22/14 0549    GLUCAP 193* 319* 101* 442* 261* 164*   Urine Drug Screen: Drugs of Abuse     Component Value Date/Time   LABOPIA NONE DETECTED 07/20/2014 1749   LABOPIA NEGATIVE 02/21/2010 2201   COCAINSCRNUR POSITIVE* 07/20/2014 1749   COCAINSCRNUR NEGATIVE 02/21/2010 2201   LABBENZ NONE DETECTED 07/20/2014 1749   LABBENZ NEGATIVE 02/21/2010 2201   AMPHETMU NONE DETECTED 07/20/2014 1749   AMPHETMU NEGATIVE 02/21/2010 2201   THCU POSITIVE* 07/20/2014 1749   LABBARB NONE DETECTED 07/20/2014 1749    Urinalysis:  Recent Labs Lab 07/20/14 1749  COLORURINE YELLOW  LABSPEC 1.025  PHURINE 7.0  GLUCOSEU >1000*  HGBUR NEGATIVE  BILIRUBINUR NEGATIVE  KETONESUR 40*  PROTEINUR 30*  UROBILINOGEN 1.0  NITRITE NEGATIVE  LEUKOCYTESUR NEGATIVE   Studies/Results: Dg Abd Acute W/chest  07/20/2014   CLINICAL DATA:  Chronic chest and abdominal pain and vomiting.  EXAM: ACUTE ABDOMEN SERIES (ABDOMEN 2 VIEW & CHEST 1 VIEW)  COMPARISON:  07/15/2014 and prior radiographs.  FINDINGS: The cardiomediastinal silhouette is unremarkable.  There is no evidence of airspace disease, pleural effusion or pneumothorax.  The bowel gas pattern is unremarkable.  There is no evidence of bowel obstruction or pneumoperitoneum.  No suspicious calcifications are identified. No acute bony abnormalities are noted.  IMPRESSION: Negative abdominal radiographs.  No acute cardiopulmonary disease.   Electronically Signed   By: Margarette Canada M.D.   On: 07/20/2014 20:07   Medications: I have reviewed the patient's current medications. Scheduled Meds: . gabapentin  300 mg Oral TID  . heparin  5,000 Units Subcutaneous 3 times per day  . insulin aspart  0-15 Units Subcutaneous TID WC  . insulin aspart  0-5 Units Subcutaneous QHS  . insulin glargine  10 Units Subcutaneous QHS  . metoCLOPramide  10 mg Oral Q6H  . mometasone-formoterol  2 puff Inhalation BID  . sodium chloride  3 mL Intravenous Q12H   Continuous Infusions:   PRN  Meds:.albuterol, naproxen Assessment/Plan: Principal Problem:   Tachycardia Active Problems:   Poorly controlled type 2 diabetes mellitus with peripheral neuropathy   Hyperlipidemia   Polysubstance abuse   Essential hypertension   Leukocytosis   Diabetic gastroparesis associated with type 2 diabetes mellitus   Left lower quadrant pain   Nausea with vomiting   Tachycardia--Pt had persistent sinus tachycardia and was not symptomatic. States that she always has a fast heart rate regardless of activity or at rest. Last time she used cocaine was 1-2 weeks ago. Troponin x 1 neg, EKG sinus rhythm. Likely tachycardia due to autonomic dysfunction due to poor glycemic control as evidenced by her HbA1c of 11.1 on 3/29, peripehral neuropathy, and gastroparesis.   Acute on chronic abdominal pain: resolved - HIV neg  Atypical chest pain:resolved  Poorly controlled Type 2 diabetes with neuropathy: A1c 11.1; 3/29 -Continue gabapentin, f/u with IMTS clinic Friday, can address switch to lyrica and authorization with insurance at that time - moderate sliding scale insulin  Coronary artery disease status post angioplasty: Angioplasty of occluded PDA performed in February 2008. Unsure of why she is not on aspirin. - can start asa at outpatient appt as needed  Hypertension: Home medications include labetalol 100 mg twice daily. -started labetalol today  Hyperlipidemia: resume home Lipitor 80 mg on d/c  Asthma: Home medications include albuterol inhaler, Advair. -Continue albuterol nebulizer every 4 hours as needed for wheezing - Dulera 2 puffs twice daily  Polysubstance use [cocaine, THC]: No records per [place] for past 6 months. -again addressed cocaine and THC use, stressed importance of cessation of these drugs.   Leukocytosis: WBC 15.9, baseline 11-16. Per chart review, she has been evaluated by hematology who felt is related to her chronic hepatitis. She does have a  history of hepatitis B infection with HBsAg+ when last checked November 2012 though HCV & HBVeAg both unremarkable. No changes in her LFTs. Ab CT 12/31/13 without any identified changes to liver.  -WBC 15.1 this morning, will cont to monitor.   Thrombocytosis: baseline 400s to 600s. Thought to be related to iron deficiency. Anemia panel 04/22/2014 with iron less than 10, ferritin 15, TIBC incalculable. She is not anemic with hemoglobin 14.0 on admission. At the time of discharge her most recent hospitalization, she was put on ferrous sulfate 325 mg 3 times daily with meals. -trending down, plts 412 will cont to monitor   #FEN:  -Diet: regular  #DVT prophylaxis: Heparin  #CODE STATUS: FULL CODE   Dispo: Discharge today   The patient does have a current PCP Luan Moore, MD) and does need an [facility] hospital follow-up appointment after  discharge.  The patient does not know have transportation limitations that hinder transportation to clinic appointments       Norman Herrlich, MD 07/22/2014, 8:29 AM

## 2014-07-22 NOTE — Progress Notes (Signed)
MD notified of BG of 430. New orders received and implemented. Will continue to monitor closely.

## 2014-07-22 NOTE — Progress Notes (Signed)
Paged Patel MD to see if patient could have something for HR elevating to 140's. It stays a few minutes, then goes back down.

## 2014-07-23 IMAGING — CR DG ABDOMEN ACUTE W/ 1V CHEST
4 series · 4 of 4 positions shown · non-contrast
Comparison: 12/17/2011 CT

CLINICAL DATA: Lower abdominal pain

ACUTE ABDOMEN SERIES (ABDOMEN 2 VIEW & CHEST 1 VIEW)

[w chest pa]
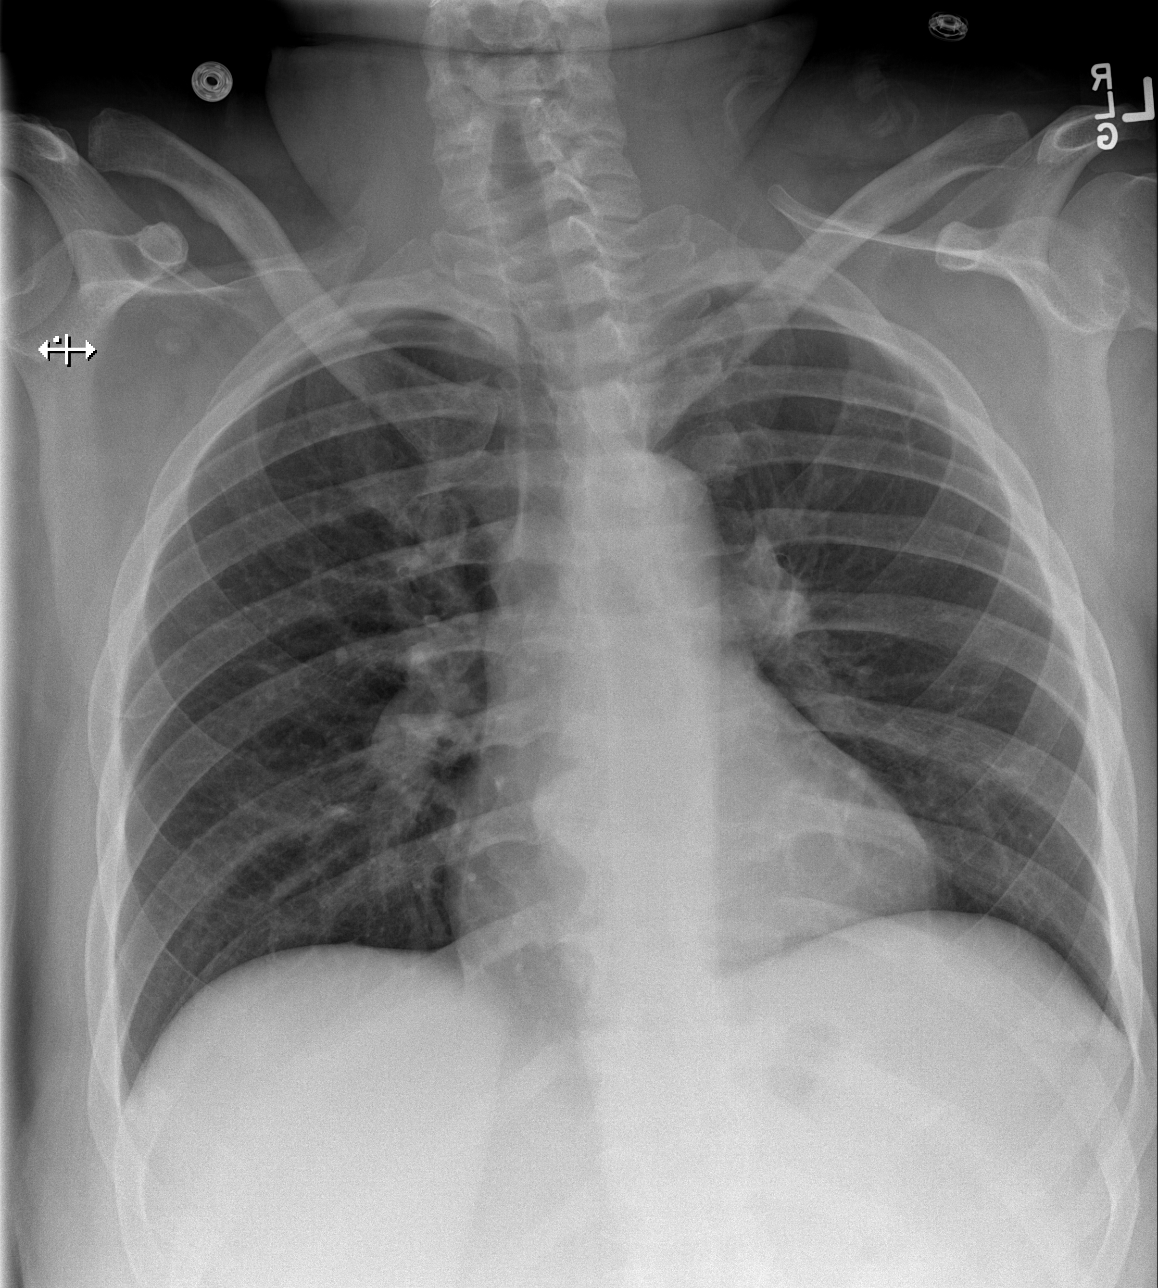

[w abdomen upright]
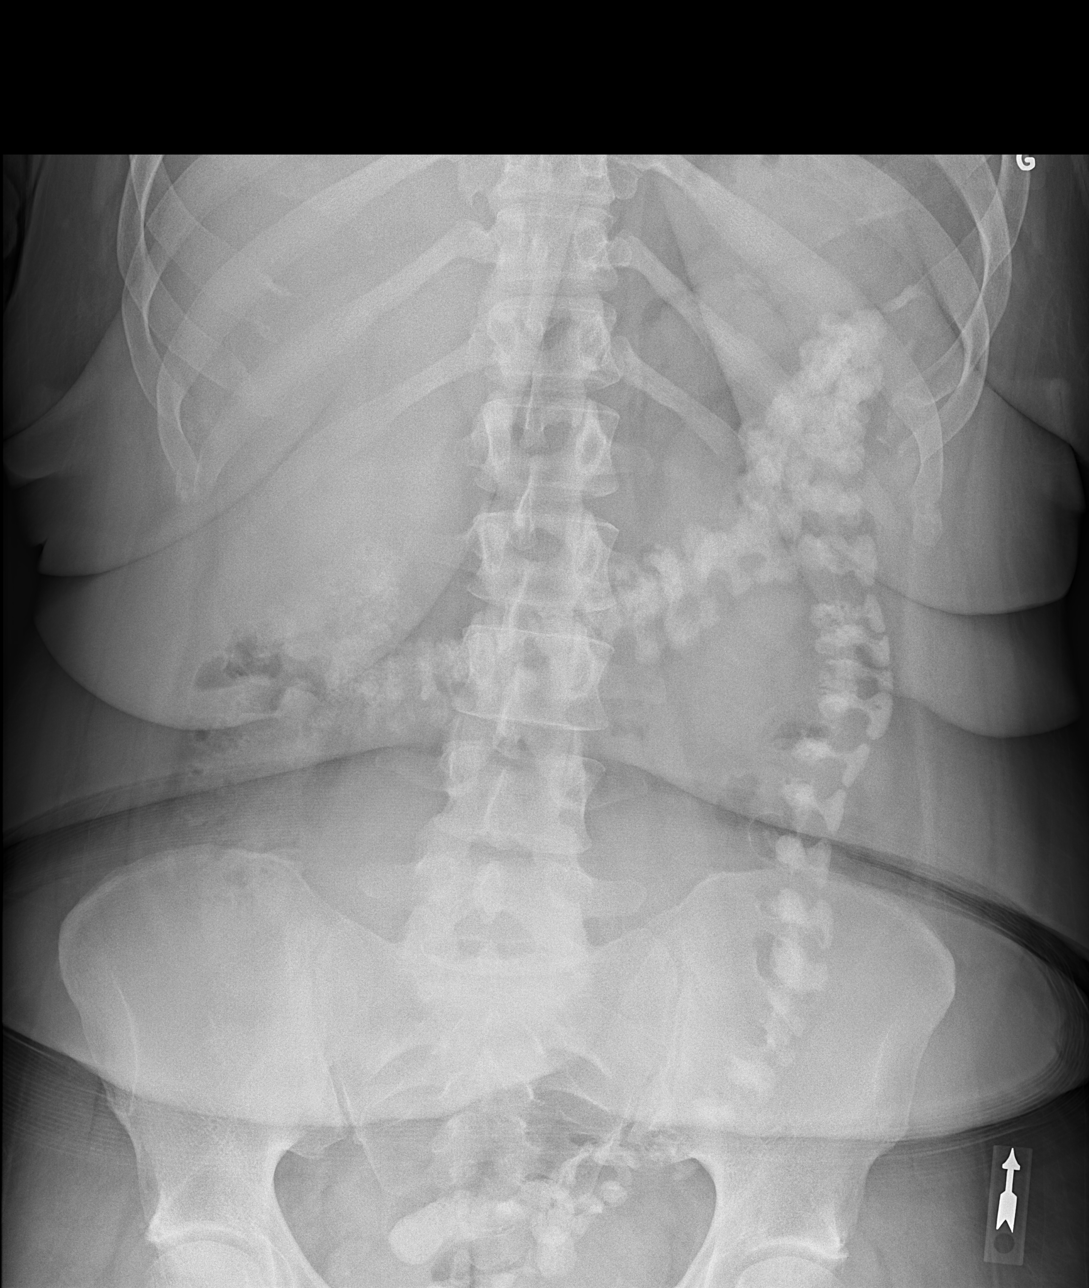

[t abdomen supine (1 of 2)]
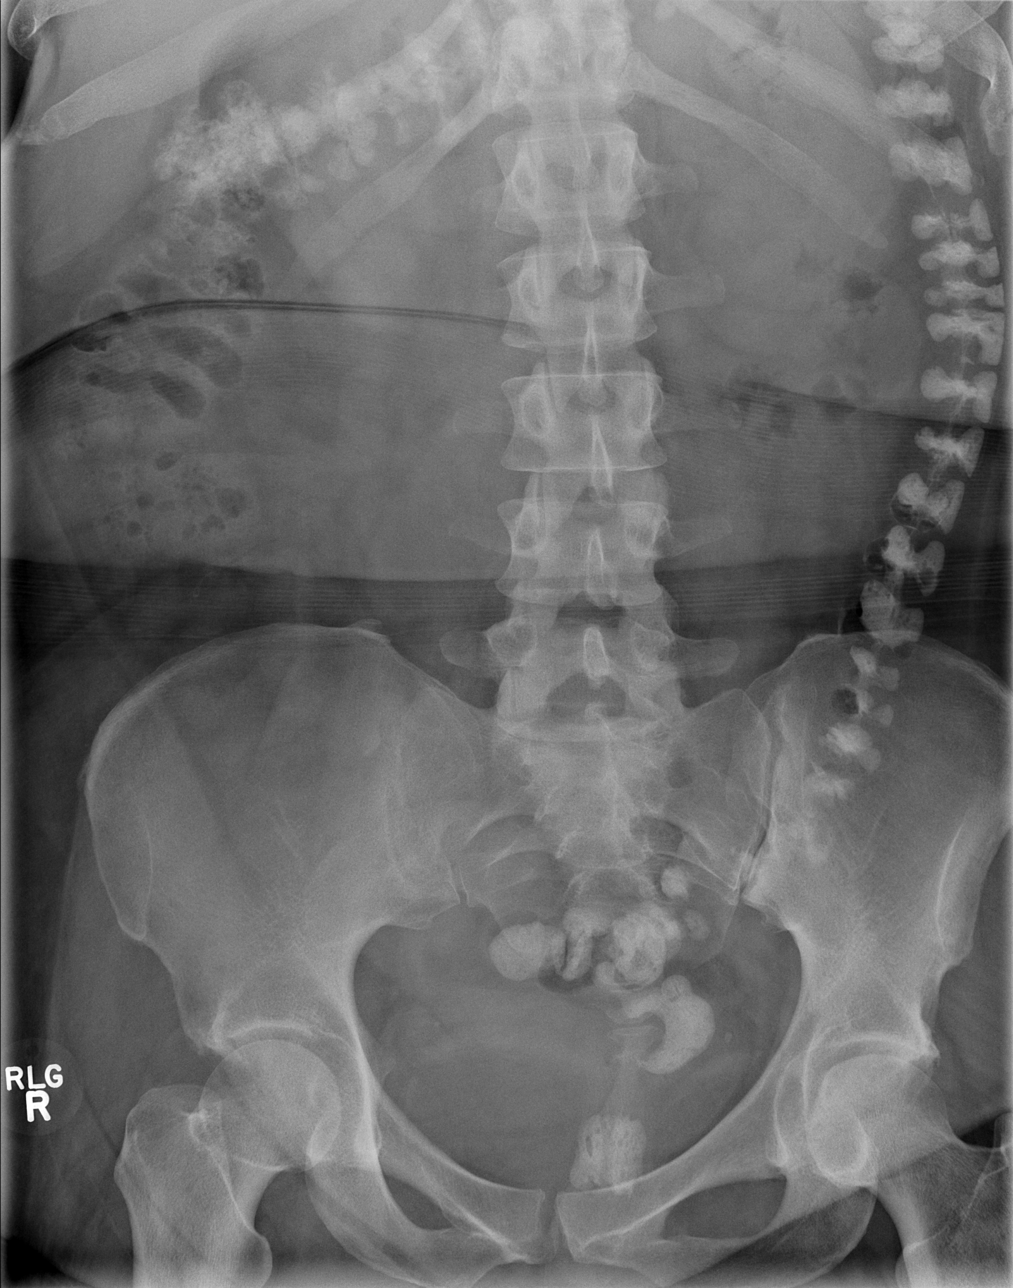

[t abdomen supine (2 of 2)]
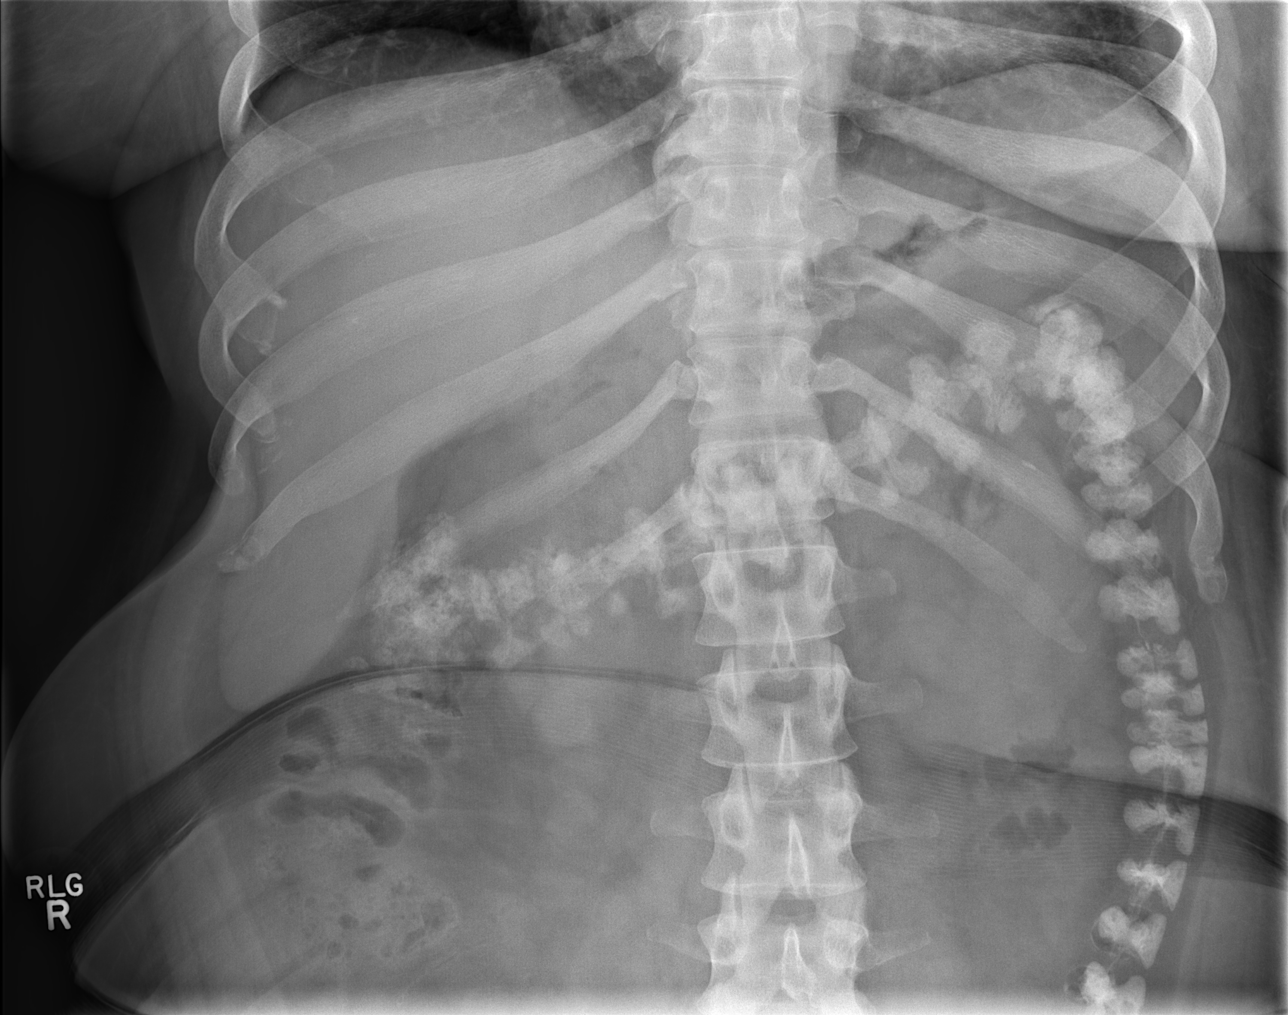

[4 of 4 positions shown; findings below may reference images not displayed]

FINDINGS: The lungs are clear.  Cardiomediastinal contours are
within normal limits.  No free intraperitoneal air.  Contrast
within the colon from recent CT scan.  Bowel gas pattern
nonobstructive.  Organ outlines normal where seen.  No acute
osseous finding.
IMPRESSION: Nonobstructive bowel gas pattern.  Contrast within the colon from
yesterday's CT scan.

## 2014-07-24 ENCOUNTER — Encounter (HOSPITAL_COMMUNITY): Payer: Self-pay | Admitting: Emergency Medicine

## 2014-07-24 ENCOUNTER — Emergency Department (HOSPITAL_COMMUNITY): Payer: Medicaid Other

## 2014-07-24 ENCOUNTER — Emergency Department (HOSPITAL_COMMUNITY)
Admission: EM | Admit: 2014-07-24 | Discharge: 2014-07-25 | Disposition: A | Discharge status: Home / Self Care | Payer: Medicaid Other | Attending: Emergency Medicine | Admitting: Emergency Medicine

## 2014-07-24 ENCOUNTER — Ambulatory Visit: Payer: Medicaid Other | Admitting: Internal Medicine

## 2014-07-24 DIAGNOSIS — G8929 Other chronic pain: Secondary | ICD-10-CM | POA: Diagnosis not present

## 2014-07-24 DIAGNOSIS — M797 Fibromyalgia: Secondary | ICD-10-CM | POA: Insufficient documentation

## 2014-07-24 DIAGNOSIS — Z872 Personal history of diseases of the skin and subcutaneous tissue: Secondary | ICD-10-CM | POA: Diagnosis not present

## 2014-07-24 DIAGNOSIS — Z8619 Personal history of other infectious and parasitic diseases: Secondary | ICD-10-CM | POA: Insufficient documentation

## 2014-07-24 DIAGNOSIS — F1410 Cocaine abuse, uncomplicated: Secondary | ICD-10-CM | POA: Insufficient documentation

## 2014-07-24 DIAGNOSIS — I2510 Atherosclerotic heart disease of native coronary artery without angina pectoris: Secondary | ICD-10-CM | POA: Diagnosis not present

## 2014-07-24 DIAGNOSIS — R079 Chest pain, unspecified: Secondary | ICD-10-CM | POA: Diagnosis present

## 2014-07-24 DIAGNOSIS — Z9861 Coronary angioplasty status: Secondary | ICD-10-CM | POA: Insufficient documentation

## 2014-07-24 DIAGNOSIS — Z79899 Other long term (current) drug therapy: Secondary | ICD-10-CM | POA: Diagnosis not present

## 2014-07-24 DIAGNOSIS — Z792 Long term (current) use of antibiotics: Secondary | ICD-10-CM | POA: Diagnosis not present

## 2014-07-24 DIAGNOSIS — D72829 Elevated white blood cell count, unspecified: Secondary | ICD-10-CM | POA: Insufficient documentation

## 2014-07-24 DIAGNOSIS — E119 Type 2 diabetes mellitus without complications: Secondary | ICD-10-CM | POA: Diagnosis not present

## 2014-07-24 DIAGNOSIS — I10 Essential (primary) hypertension: Secondary | ICD-10-CM | POA: Diagnosis not present

## 2014-07-24 DIAGNOSIS — R1032 Left lower quadrant pain: Secondary | ICD-10-CM | POA: Diagnosis not present

## 2014-07-24 DIAGNOSIS — R112 Nausea with vomiting, unspecified: Secondary | ICD-10-CM

## 2014-07-24 DIAGNOSIS — F419 Anxiety disorder, unspecified: Secondary | ICD-10-CM | POA: Insufficient documentation

## 2014-07-24 DIAGNOSIS — K219 Gastro-esophageal reflux disease without esophagitis: Secondary | ICD-10-CM | POA: Insufficient documentation

## 2014-07-24 DIAGNOSIS — I252 Old myocardial infarction: Secondary | ICD-10-CM | POA: Insufficient documentation

## 2014-07-24 DIAGNOSIS — Z794 Long term (current) use of insulin: Secondary | ICD-10-CM | POA: Insufficient documentation

## 2014-07-24 DIAGNOSIS — J45909 Unspecified asthma, uncomplicated: Secondary | ICD-10-CM | POA: Diagnosis not present

## 2014-07-24 DIAGNOSIS — E785 Hyperlipidemia, unspecified: Secondary | ICD-10-CM | POA: Insufficient documentation

## 2014-07-24 DIAGNOSIS — D509 Iron deficiency anemia, unspecified: Secondary | ICD-10-CM | POA: Insufficient documentation

## 2014-07-24 DIAGNOSIS — Z87448 Personal history of other diseases of urinary system: Secondary | ICD-10-CM | POA: Diagnosis not present

## 2014-07-24 DIAGNOSIS — E669 Obesity, unspecified: Secondary | ICD-10-CM | POA: Diagnosis not present

## 2014-07-24 DIAGNOSIS — Z87891 Personal history of nicotine dependence: Secondary | ICD-10-CM | POA: Diagnosis not present

## 2014-07-24 DIAGNOSIS — R000 Tachycardia, unspecified: Secondary | ICD-10-CM | POA: Diagnosis not present

## 2014-07-24 DIAGNOSIS — Z8742 Personal history of other diseases of the female genital tract: Secondary | ICD-10-CM | POA: Insufficient documentation

## 2014-07-24 DIAGNOSIS — Z8701 Personal history of pneumonia (recurrent): Secondary | ICD-10-CM | POA: Insufficient documentation

## 2014-07-24 DIAGNOSIS — G629 Polyneuropathy, unspecified: Secondary | ICD-10-CM | POA: Diagnosis not present

## 2014-07-24 DIAGNOSIS — G43909 Migraine, unspecified, not intractable, without status migrainosus: Secondary | ICD-10-CM | POA: Diagnosis not present

## 2014-07-24 DIAGNOSIS — F329 Major depressive disorder, single episode, unspecified: Secondary | ICD-10-CM | POA: Insufficient documentation

## 2014-07-24 DIAGNOSIS — Z7951 Long term (current) use of inhaled steroids: Secondary | ICD-10-CM | POA: Insufficient documentation

## 2014-07-24 DIAGNOSIS — Z8673 Personal history of transient ischemic attack (TIA), and cerebral infarction without residual deficits: Secondary | ICD-10-CM | POA: Insufficient documentation

## 2014-07-24 LAB — CBC WITH DIFFERENTIAL/PLATELET
BASOS PCT: 0 % (ref 0–1)
Basophils Absolute: 0 10*3/uL (ref 0.0–0.1)
EOS ABS: 0.4 10*3/uL (ref 0.0–0.7)
Eosinophils Relative: 3 % (ref 0–5)
HCT: 40.1 % (ref 36.0–46.0)
Hemoglobin: 14 g/dL (ref 12.0–15.0)
Lymphocytes Relative: 17 % (ref 12–46)
Lymphs Abs: 2.7 10*3/uL (ref 0.7–4.0)
MCH: 26.7 pg (ref 26.0–34.0)
MCHC: 34.9 g/dL (ref 30.0–36.0)
MCV: 76.4 fL — ABNORMAL LOW (ref 78.0–100.0)
MONO ABS: 0.9 10*3/uL (ref 0.1–1.0)
MONOS PCT: 6 % (ref 3–12)
NEUTROS PCT: 74 % (ref 43–77)
Neutro Abs: 11.5 10*3/uL — ABNORMAL HIGH (ref 1.7–7.7)
Platelets: 398 10*3/uL (ref 150–400)
RBC: 5.25 MIL/uL — ABNORMAL HIGH (ref 3.87–5.11)
RDW: 16.7 % — ABNORMAL HIGH (ref 11.5–15.5)
WBC: 15.5 10*3/uL — ABNORMAL HIGH (ref 4.0–10.5)

## 2014-07-24 LAB — LIPASE, BLOOD: LIPASE: 34 U/L (ref 11–59)

## 2014-07-24 LAB — TROPONIN I: Troponin I: <0.03 ng/mL (ref <0.031)

## 2014-07-24 LAB — COMPREHENSIVE METABOLIC PANEL
ALT: 8 U/L (ref 0–35)
AST: 17 U/L (ref 0–37)
Albumin: 3.8 g/dL (ref 3.5–5.2)
Alkaline Phosphatase: 63 U/L (ref 39–117)
Anion gap: 11 (ref 5–15)
BUN: 11 mg/dL (ref 6–23)
CO2: 22 mmol/L (ref 19–32)
Calcium: 9.4 mg/dL (ref 8.4–10.5)
Chloride: 103 mmol/L (ref 96–112)
Creatinine, Ser: 0.86 mg/dL (ref 0.50–1.10)
GFR calc Af Amer: >90 mL/min (ref >90)
GFR calc non Af Amer: 83 mL/min — ABNORMAL LOW (ref >90)
Glucose, Bld: 199 mg/dL — ABNORMAL HIGH (ref 70–99)
Potassium: 3.8 mmol/L (ref 3.5–5.1)
SODIUM: 136 mmol/L (ref 135–145)
TOTAL PROTEIN: 8.2 g/dL (ref 6.0–8.3)
Total Bilirubin: 0.6 mg/dL (ref 0.3–1.2)

## 2014-07-24 LAB — POC URINE PREG, ED: PREG TEST UR: NEGATIVE

## 2014-07-24 MED ORDER — SODIUM CHLORIDE 0.9 % IV BOLUS (SEPSIS)
1000.0000 mL | Freq: Once | INTRAVENOUS | Status: AC
  Administered 2014-07-24: 1000 mL via INTRAVENOUS

## 2014-07-24 MED ORDER — ONDANSETRON HCL 4 MG/2ML IJ SOLN
4.0000 mg | Freq: Once | INTRAMUSCULAR | Status: AC
  Administered 2014-07-24: 4 mg via INTRAVENOUS
  Filled 2014-07-24: qty 2

## 2014-07-24 NOTE — ED Notes (Signed)
Bed: WA25 Expected date:  Expected time:  Means of arrival:  Comments: triage 

## 2014-07-24 NOTE — ED Notes (Addendum)
Pt reports onset of centralized chest pain, lower left abdominal pain with nausea and vomiting starting about an hour ago. Pt restless and irritable in triage.

## 2014-07-24 NOTE — ED Provider Notes (Signed)
CSN: [id number]     Arrival date & time 07/24/14  1914 History   First MD Initiated Contact with Patient 07/24/14 2209     Chief Complaint  Patient presents with  . Chest Pain  . Abdominal Pain     (Consider location/radiation/quality/duration/timing/severity/associated sxs/prior Treatment) The history is provided by the patient and medical records. No language interpreter was used.     Shirley [name] is a 39 y.o. female  with a hx of gastroparesis, chronic abd pain, IDA, IDDM, CAD with angioplasty and stent placement in 05/2006, MI, CVA, polysubstance abuse presents to the Emergency Department complaining of gradual, persistent, progressively worsening LLQ abd pain and central chest pain onset 1 hour ago.  Pt reports all her pain began at the same time while she was laying on the cough watching TV tonight.  Pt reports this is similar to previous episodes.  She reports taking Reglan at home without relief.  Nothing seems to make her symptoms better or worse.  Pt reports her chest pain is squeezing and stabbing and similar to previous episodes of muscle strain after vomiting.  She reports tonight's pain began after vomiting.  She reports her abd pain feels the same as her CP and also began after vomiting.      Past Medical History  Diagnosis Date  . Thrombocytosis     Hem/Onc suggested 2/2 chronic hepatits and/or iron deficiency anemia  . Iron deficiency anemia   . N&V (nausea and vomiting)     Chronic. Unclear etiology with multiple admission and ED visits. CT abdomen with and without contrast (02/2011)  showed no acute process. Gastic Emptying scan (01/2010) was normal. Ultrasound of the abdomen was within normal limits. Hepatitis B viral load was undectable. HIV NR. EGD - gastritis, Hpylori + s/p Rx  . Hypertension   . Hyperlipidemia   . Diabetes mellitus type 2, uncontrolled, with complications   . Recurrent boils   . CAD (coronary artery disease) 06/15/2006    s/p Subendocardial MI  with PDA angioplasty(no stent) on 06/15/06 and relook  cath 06/19/06 showed patency of site. Cath 12/10- no restenosis or significant CAD progression  . Irregular menses     Small ovarian follicles seen on NL(8/92)  . History of pyelonephritis     H/o GrpB Pyelonephritis (9/06) and UTI- 07/11- E.Coli, 12/10- GBS  . Abscess of tunica vaginalis     10/09- Abundant S. aureus- sensitive to all abx  . Obesity   . Gastritis   . Depression   . Peripheral neuropathy   . Fibromyalgia   . GERD (gastroesophageal reflux disease)   . Anxiety   . Gastroparesis     secondary to poorly controlled DM, last emptying study performed 01/2010  was normal but may be falsely positive as pt was on reglan  . Blood dyscrasia   . Substance abuse   . MI (myocardial infarction) 2008  . OSA (obstructive sleep apnea)     "suppose to wear mask but I don't" (04/22/2014)  . Seasonal asthma   . Hepatitis B, chronic     Hep BeAb+,Hep B cAb+ & Hep BsAg+ (9/06)  . Migraine     "weekly" (04/22/2014)  . CVA (cerebral vascular accident) ~ 02/2014    denies residual on 04/22/2014  . CVA (cerebral vascular accident)     history of remote right cerebellar infarct noted on head CT at least since 10/2011  . Pneumonia     "this is probably the 2nd or 3rd  time I've had pneumonia" (04/22/2014)   Past Surgical History  Procedure Laterality Date  . Cesarean section  1997  . Coronary angioplasty with stent placement  2008    "2 stents"  . Esophagogastroduodenoscopy N/A 04/23/2014    Procedure: ESOPHAGOGASTRODUODENOSCOPY (EGD);  Surgeon: Winfield Cunas., MD;  Location: [facility] ENDOSCOPY;  Service: Endoscopy;  Laterality: N/A;   Family History  Problem Relation Age of Onset  . Diabetes Father    History  Substance Use Topics  . Smoking status: Former Smoker    Types: Cigarettes    Quit date: 04/24/1996  . Smokeless tobacco: Never Used     Comment: quit smoking cigarettes age 5  . Alcohol Use: Yes     Comment:  04/22/2014 "might have a few drinks a month"   OB History    No data available     Review of Systems  Constitutional: Negative for fever, diaphoresis, appetite change, fatigue and unexpected weight change.  HENT: Negative for mouth sores.   Eyes: Negative for visual disturbance.  Respiratory: Negative for cough, chest tightness, shortness of breath and wheezing.   Cardiovascular: Positive for chest pain.  Gastrointestinal: Positive for nausea, vomiting and abdominal pain. Negative for diarrhea and constipation.  Endocrine: Negative for polydipsia, polyphagia and polyuria.  Genitourinary: Negative for dysuria, urgency, frequency and hematuria.  Musculoskeletal: Negative for back pain and neck stiffness.  Skin: Negative for rash.  Allergic/Immunologic: Negative for immunocompromised state.  Neurological: Negative for syncope, light-headedness and headaches.  Hematological: Does not bruise/bleed easily.  Psychiatric/Behavioral: Negative for sleep disturbance. The patient is not nervous/anxious.       Allergies  Lisinopril and Morphine and related  Home Medications   Prior to Admission medications   Medication Sig Start Date End Date Taking? Authorizing Provider  albuterol (PROVENTIL HFA;VENTOLIN HFA) 108 (90 BASE) MCG/ACT inhaler Inhale 2 puffs into the lungs every 4 (four) hours as needed for wheezing or shortness of breath. 04/26/14  Yes Alexa Sherral Hammers, MD  atorvastatin (LIPITOR) 80 MG tablet Take 1 tablet (80 mg total) by mouth daily at 6 PM. 07/25/13  Yes Dixon Boos, MD  dicyclomine (BENTYL) 20 MG tablet Take 1 tablet (20 mg total) by mouth 2 (two) times daily. 12/31/13  Yes [name], MD  Fluticasone-Salmeterol (ADVAIR) 100-50 MCG/DOSE AEPB Inhale 1 puff into the lungs 2 (two) times daily. 04/26/14  Yes Alexa Sherral Hammers, MD  gabapentin (NEURONTIN) 300 MG capsule Take 300 mg by mouth 3 (three) times daily.    Yes Historical Provider, MD  Insulin Glargine (LANTUS  SOLOSTAR) 100 UNIT/ML Solostar Pen Inject 50 Units into the skin daily at 10 pm. 05/16/14  Yes Luan Moore, MD  LORazepam (ATIVAN) 1 MG tablet Take 1 mg by mouth every 8 (eight) hours as needed for anxiety.   Yes Historical Provider, MD  metoCLOPramide (REGLAN) 10 MG tablet Take 1 tablet (10 mg total) by mouth every 6 (six) hours. 04/26/14  Yes Alexa Sherral Hammers, MD  naproxen sodium (ANAPROX) 220 MG tablet Take 440 mg by mouth 2 (two) times daily as needed (pain).   Yes Historical Provider, MD  omeprazole (PRILOSEC) 20 MG capsule Take 1 capsule (20 mg total) by mouth daily. 06/30/14  Yes Noland Fordyce, PA-C  oxyCODONE-acetaminophen (PERCOCET) 5-325 MG per tablet Take 2 tablets by mouth every 4 (four) hours as needed. 07/15/14  Yes Ernestina Patches, MD  senna-docusate (SENOKOT-S) 8.6-50 MG per tablet Take 1 tablet by mouth daily.   Yes Historical Provider,  MD  chlorpheniramine-HYDROcodone (TUSSIONEX) 10-8 MG/5ML LQCR Take 5 mLs by mouth every 12 (twelve) hours as needed for cough. Patient not taking: Reported on 07/20/2014 05/25/14   Luan Moore, MD  ferrous sulfate 325 (65 FE) MG tablet Take 1 tablet (325 mg total) by mouth 3 (three) times daily with meals. Patient not taking: Reported on 07/20/2014 04/26/14   Alexa Sherral Hammers, MD  insulin aspart (NOVOLOG) 100 UNIT/ML injection Sliding scale- If sugar 150-199: inject 2 unit;  200-249: 4 units, 250-299: 7 units; 300-349: 10 units; Over 350: 12 units PENS PLEASE 05/16/14   Luan Moore, MD  labetalol (NORMODYNE) 100 MG tablet Take 1 tablet (100 mg total) by mouth 2 (two) times daily. 03/05/14   Oval Linsey, MD  levofloxacin (LEVAQUIN) 750 MG tablet Take 1 tablet (750 mg total) by mouth daily. Patient not taking: Reported on 06/30/2014 04/26/14   Alexa Sherral Hammers, MD  metroNIDAZOLE (FLAGYL) 500 MG tablet Take 1 tablet (500 mg total) by mouth every 12 (twelve) hours. Patient not taking: Reported on 06/30/2014 04/26/14   Alexa Sherral Hammers, MD  promethazine  (PHENERGAN) 25 MG suppository Place 1 suppository (25 mg total) rectally every 6 (six) hours as needed for nausea or vomiting. Patient not taking: Reported on 07/20/2014 07/15/14   Ernestina Patches, MD   BP 183/131 mmHg  Pulse 119  Temp(Src) 98.2 F (36.8 C) (Oral)  Resp 18  SpO2 100%  LMP 06/28/2014 Physical Exam  Constitutional: She appears well-developed and well-nourished. She appears distressed.  Awake, alert,  Writhing on bed Pt actively vomiting  HENT:  Head: Normocephalic and atraumatic.  Mouth/Throat: Oropharynx is clear and moist. No oropharyngeal exudate.  Eyes: Conjunctivae are normal. No scleral icterus.  Neck: Normal range of motion. Neck supple.  Cardiovascular: Normal rate, regular rhythm, normal heart sounds and intact distal pulses.   No murmur heard. Pulmonary/Chest: Effort normal and breath sounds normal. No respiratory distress. She has no wheezes.  Equal chest expansion  Abdominal: Soft. Bowel sounds are normal. She exhibits no distension and no mass. There is tenderness in the left lower quadrant. There is guarding. There is no rebound and no CVA tenderness.    LLQ and pain with tenderness and guarding No CVA tenderness  Musculoskeletal: Normal range of motion. She exhibits no edema.  Neurological: She is alert. Coordination normal.  Speech is clear and goal oriented Moves extremities without ataxia  Skin: Skin is warm. She is not diaphoretic. No erythema.  No rash  Psychiatric: She has a normal mood and affect.  Nursing note and vitals reviewed.   ED Course  Procedures (including critical care time) Labs Review Labs Reviewed  CBC WITH DIFFERENTIAL/PLATELET - Abnormal; Notable for the following:    WBC 15.5 (*)    RBC 5.25 (*)    MCV 76.4 (*)    RDW 16.7 (*)    Neutro Abs 11.5 (*)    All other components within normal limits  COMPREHENSIVE METABOLIC PANEL - Abnormal; Notable for the following:    Glucose, Bld 199 (*)    GFR calc non Af Amer 83 (*)     All other components within normal limits  URINALYSIS, ROUTINE W REFLEX MICROSCOPIC - Abnormal; Notable for the following:    APPearance CLOUDY (*)    Glucose, UA 500 (*)    All other components within normal limits  URINE RAPID DRUG SCREEN (HOSP PERFORMED) - Abnormal; Notable for the following:    Cocaine POSITIVE (*)    All other  components within normal limits  TROPONIN I  LIPASE, BLOOD  TROPONIN I  POC URINE PREG, ED    Imaging Review No results found.   EKG Interpretation   Date/Time:  Friday July 24 2014 19:19:36 EDT Ventricular Rate:  118 PR Interval:  164 QRS Duration: 111 QT Interval:  364 QTC Calculation: 510 R Axis:   123 Text Interpretation:  Sinus tachycardia Inferior infarct, age  indeterminate Probable anteroseptal infarct, acute Baseline wander in  lead(s) V2 V5 since last tracing no significant change Confirmed by Eulis Foster   MD, Vira Agar ([id number]) on 07/24/2014 7:47:43 PM      MDM   Final diagnoses:  Chest pain  LLQ abdominal pain  Non-intractable vomiting with nausea, vomiting of unspecified type  Cocaine abuse   Shirley [name] presents with chest pain and abd pain.  Pt with hx of  Gastroparesis and chronic left lower quadrant abdominal pain here with symptoms of same today. Patient denies cocaine usage however UDS positive for cocaine. Patient also with chest pain.   Patient also appears uncomfortable and is tachycardic. Will give IV Zofran and reassess.   Labs pending.  Pt noted to be hypertensive on arrival.    1:40 AM  Patient given Zofran without relief. Will give ativan and pain medicine.   Patient with leukocytosis unchanged from previous visits.   Patient continues to have pain and nausea.  Last CT scan was Fall 2015 and normal.  Pt denies all vaginal symptoms   Patient discussed with Dr. [name] who will follow and dispo accordingly. Plan: If patient is unable to pass by mouth trial or pain is unable to be controlled here in the emergency  department she will need admission.  Otherwise plan for d/c home.  Delta trop pending.    BP 183/131 mmHg  Pulse 119  Temp(Src) 98.2 F (36.8 C) (Oral)  Resp 18  SpO2 100%  LMP 06/28/2014   Jarrett Soho Tyliah Schlereth, PA-C 07/25/14 0153  [name], MD 07/25/14 [phone]

## 2014-07-25 ENCOUNTER — Emergency Department (HOSPITAL_COMMUNITY): Payer: Medicaid Other

## 2014-07-25 LAB — URINALYSIS, ROUTINE W REFLEX MICROSCOPIC
BILIRUBIN URINE: NEGATIVE
Glucose, UA: 500 mg/dL — AB
HGB URINE DIPSTICK: NEGATIVE
Ketones, ur: NEGATIVE mg/dL
Leukocytes, UA: NEGATIVE
NITRITE: NEGATIVE
Protein, ur: NEGATIVE mg/dL
SPECIFIC GRAVITY, URINE: 1.022 (ref 1.005–1.030)
UROBILINOGEN UA: 1 mg/dL (ref 0.0–1.0)
pH: 7.5 (ref 5.0–8.0)

## 2014-07-25 LAB — TROPONIN I: Troponin I: <0.03 ng/mL (ref <0.031)

## 2014-07-25 LAB — RAPID URINE DRUG SCREEN, HOSP PERFORMED
AMPHETAMINES: NOT DETECTED
BARBITURATES: NOT DETECTED
Benzodiazepines: NOT DETECTED
Cocaine: POSITIVE — AB
Opiates: NOT DETECTED
Tetrahydrocannabinol: NOT DETECTED

## 2014-07-25 MED ORDER — PROMETHAZINE HCL 25 MG/ML IJ SOLN
25.0000 mg | Freq: Once | INTRAMUSCULAR | Status: AC
  Administered 2014-07-25: 25 mg via INTRAVENOUS
  Filled 2014-07-25: qty 1

## 2014-07-25 MED ORDER — PROMETHAZINE HCL 25 MG PO TABS: 25.0000 mg | ORAL_TABLET | Freq: Four times a day (QID) | ORAL | Status: DC | PRN

## 2014-07-25 MED ORDER — HYDROMORPHONE HCL 1 MG/ML IJ SOLN
1.0000 mg | Freq: Once | INTRAMUSCULAR | Status: AC
  Administered 2014-07-25: 1 mg via INTRAVENOUS
  Filled 2014-07-25: qty 1

## 2014-07-25 MED ORDER — LORAZEPAM 2 MG/ML IJ SOLN
1.0000 mg | Freq: Once | INTRAMUSCULAR | Status: AC
  Administered 2014-07-25: 1 mg via INTRAVENOUS
  Filled 2014-07-25: qty 1

## 2014-07-25 NOTE — Discharge Instructions (Signed)

## 2014-07-30 ENCOUNTER — Telehealth: Payer: Self-pay | Admitting: *Deleted

## 2014-07-30 NOTE — Telephone Encounter (Signed)
Pharmacy - [facility] is requesting BD pen needles nano 32gx5/32". Hilda Blades Manessa Buley RN 07/30/14 2:15PM

## 2014-07-31 ENCOUNTER — Other Ambulatory Visit: Payer: Self-pay | Admitting: Internal Medicine

## 2014-08-17 ENCOUNTER — Other Ambulatory Visit: Payer: Self-pay | Admitting: Internal Medicine

## 2014-08-20 ENCOUNTER — Other Ambulatory Visit: Payer: Self-pay | Admitting: Internal Medicine

## 2014-09-01 ENCOUNTER — Encounter: Payer: Self-pay | Admitting: *Deleted

## 2014-09-16 ENCOUNTER — Other Ambulatory Visit: Payer: Self-pay | Admitting: Internal Medicine

## 2014-09-16 NOTE — Progress Notes (Signed)
Patient has not been to see any providers in our clinic in many months (since October 2015) and her diabetes has not been addressed since before that. I have granted a request to refill patient's insulin regimen as prescribed from prior clinic visits with no refills so that she may have enough supplies to last until a clinic visit. Message sent to our front desk to set patient up with appointment.

## 2014-09-17 ENCOUNTER — Other Ambulatory Visit: Payer: Self-pay | Admitting: Internal Medicine

## 2014-09-18 IMAGING — CR DG ABDOMEN ACUTE W/ 1V CHEST
4 series · 4 of 4 positions shown · non-contrast
Comparison: 12/18/2011

CLINICAL DATA: Left-sided abdominal pain

ACUTE ABDOMEN SERIES (ABDOMEN 2 VIEW & CHEST 1 VIEW)

[w chest pa]
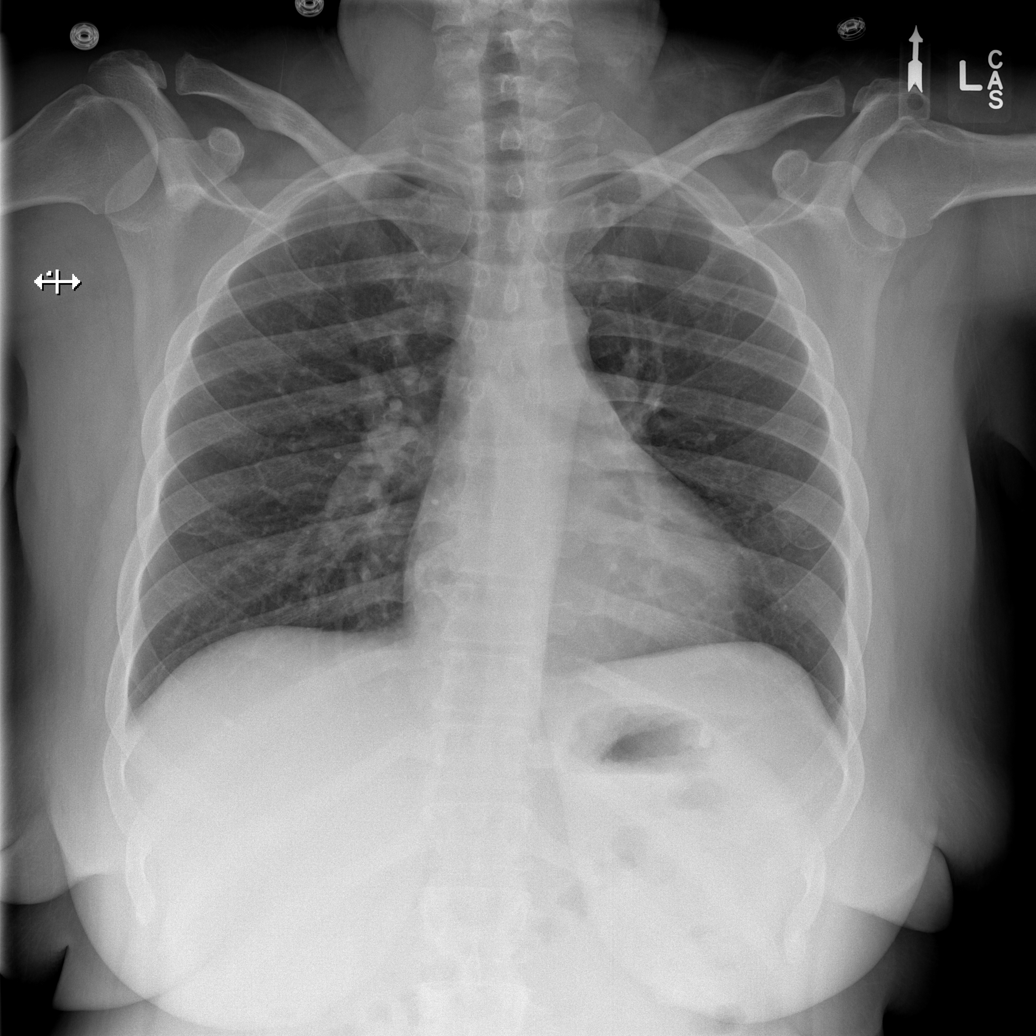

[w abdomen upright]
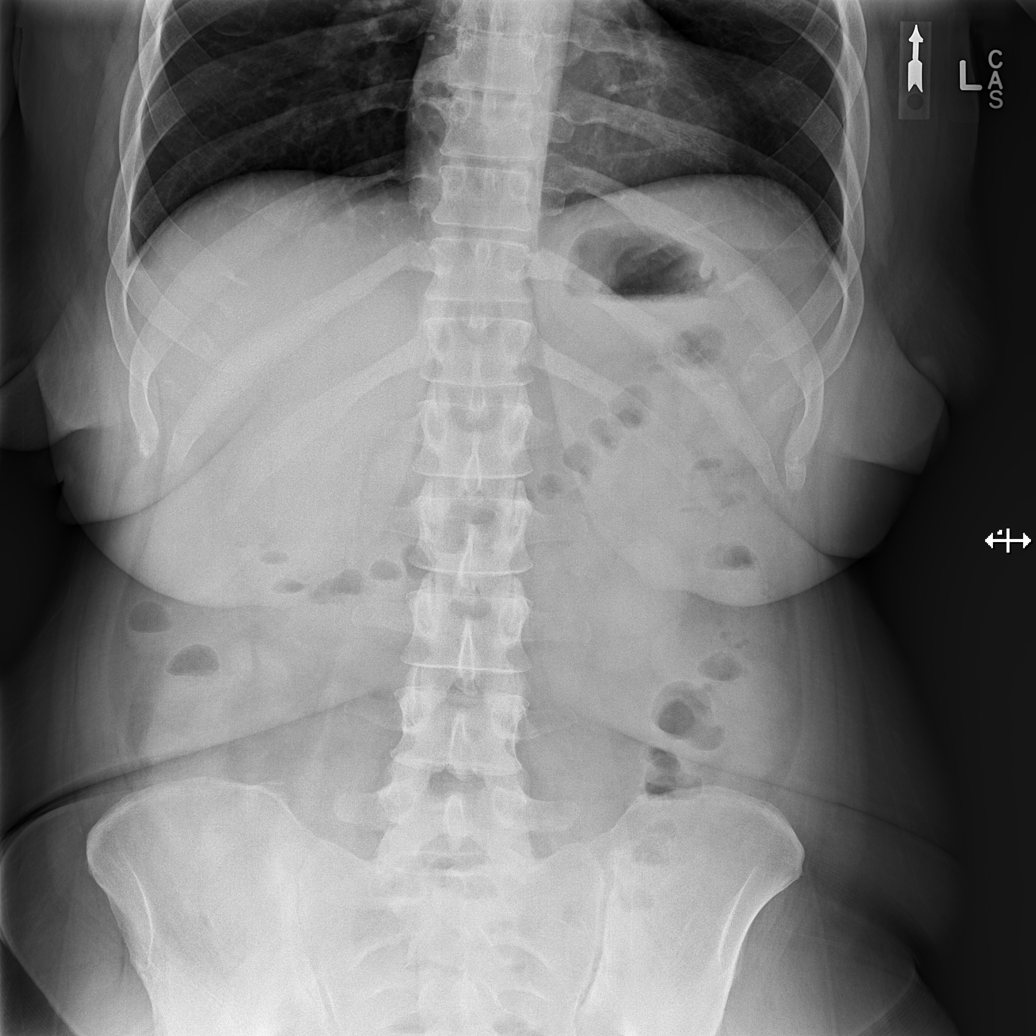

[t abdomen supine (1 of 2)]
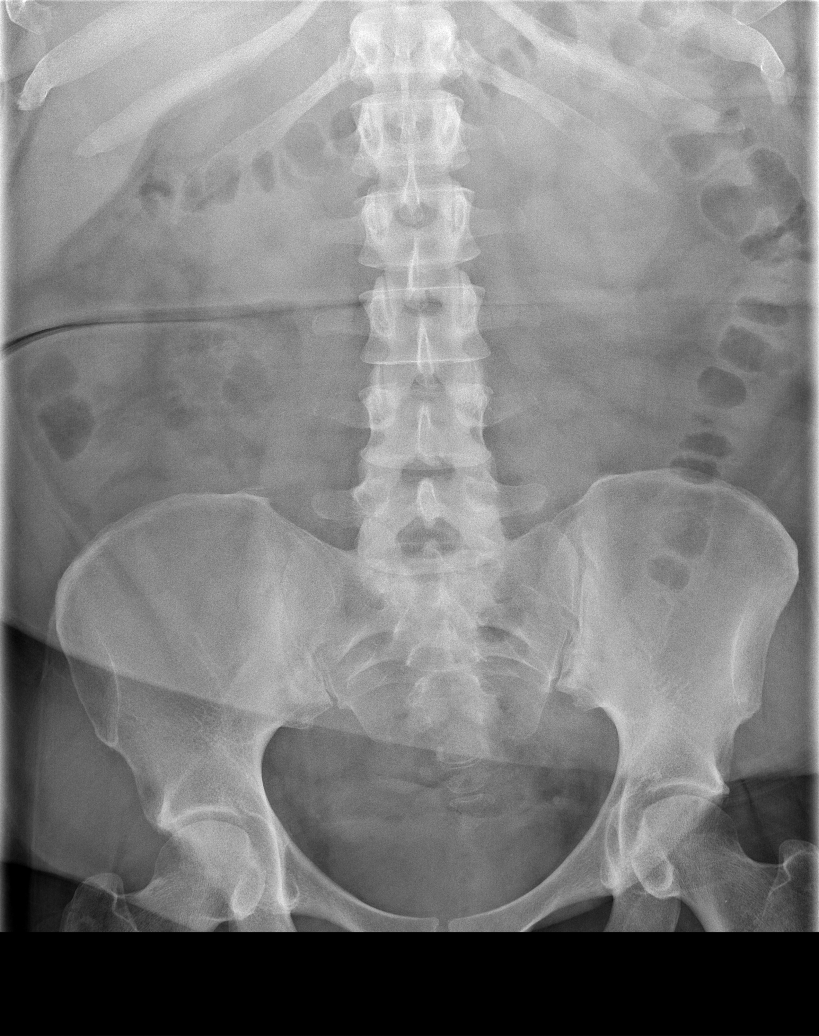

[t abdomen supine (2 of 2)]
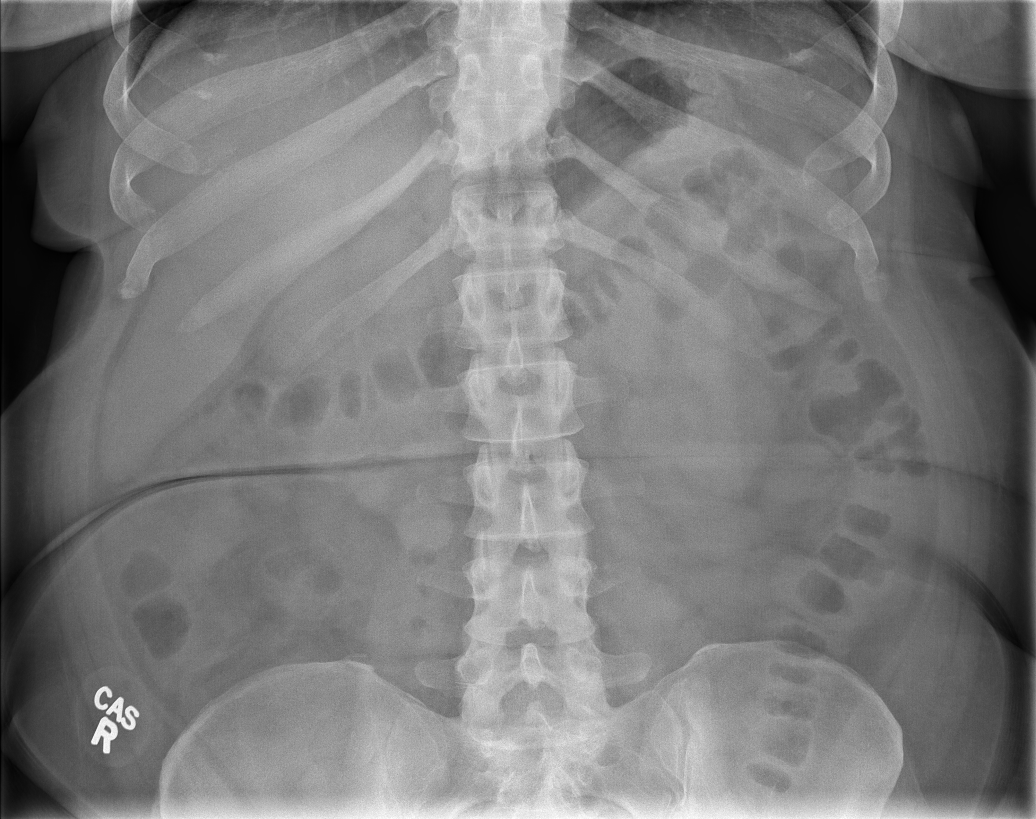

[4 of 4 positions shown; findings below may reference images not displayed]

FINDINGS: The lungs remain clear.  Cardiomediastinal contours
within normal range.

No free intraperitoneal air. The bowel gas pattern is non-
obstructive. Organ outlines are normal where seen. No acute or
aggressive osseous abnormality identified.
IMPRESSION: Nonobstructive bowel gas pattern.

## 2014-09-19 ENCOUNTER — Other Ambulatory Visit: Payer: Self-pay | Admitting: Internal Medicine

## 2014-09-21 ENCOUNTER — Encounter (HOSPITAL_COMMUNITY): Payer: Self-pay | Admitting: Emergency Medicine

## 2014-09-21 ENCOUNTER — Emergency Department (HOSPITAL_COMMUNITY)
Admission: EM | Admit: 2014-09-21 | Discharge: 2014-09-21 | Disposition: A | Discharge status: Home / Self Care | Payer: Medicaid Other | Attending: Emergency Medicine | Admitting: Emergency Medicine

## 2014-09-21 DIAGNOSIS — I252 Old myocardial infarction: Secondary | ICD-10-CM | POA: Diagnosis not present

## 2014-09-21 DIAGNOSIS — E785 Hyperlipidemia, unspecified: Secondary | ICD-10-CM | POA: Diagnosis not present

## 2014-09-21 DIAGNOSIS — D649 Anemia, unspecified: Secondary | ICD-10-CM | POA: Diagnosis not present

## 2014-09-21 DIAGNOSIS — I10 Essential (primary) hypertension: Secondary | ICD-10-CM | POA: Insufficient documentation

## 2014-09-21 DIAGNOSIS — F419 Anxiety disorder, unspecified: Secondary | ICD-10-CM | POA: Diagnosis not present

## 2014-09-21 DIAGNOSIS — Z9861 Coronary angioplasty status: Secondary | ICD-10-CM | POA: Diagnosis not present

## 2014-09-21 DIAGNOSIS — G43909 Migraine, unspecified, not intractable, without status migrainosus: Secondary | ICD-10-CM | POA: Diagnosis not present

## 2014-09-21 DIAGNOSIS — Z87448 Personal history of other diseases of urinary system: Secondary | ICD-10-CM | POA: Insufficient documentation

## 2014-09-21 DIAGNOSIS — Z87891 Personal history of nicotine dependence: Secondary | ICD-10-CM | POA: Diagnosis not present

## 2014-09-21 DIAGNOSIS — E669 Obesity, unspecified: Secondary | ICD-10-CM | POA: Insufficient documentation

## 2014-09-21 DIAGNOSIS — G629 Polyneuropathy, unspecified: Secondary | ICD-10-CM | POA: Diagnosis not present

## 2014-09-21 DIAGNOSIS — L02214 Cutaneous abscess of groin: Secondary | ICD-10-CM | POA: Insufficient documentation

## 2014-09-21 DIAGNOSIS — I2510 Atherosclerotic heart disease of native coronary artery without angina pectoris: Secondary | ICD-10-CM | POA: Insufficient documentation

## 2014-09-21 DIAGNOSIS — Z79899 Other long term (current) drug therapy: Secondary | ICD-10-CM | POA: Diagnosis not present

## 2014-09-21 DIAGNOSIS — Z8673 Personal history of transient ischemic attack (TIA), and cerebral infarction without residual deficits: Secondary | ICD-10-CM | POA: Insufficient documentation

## 2014-09-21 DIAGNOSIS — Z8619 Personal history of other infectious and parasitic diseases: Secondary | ICD-10-CM | POA: Insufficient documentation

## 2014-09-21 DIAGNOSIS — K219 Gastro-esophageal reflux disease without esophagitis: Secondary | ICD-10-CM | POA: Insufficient documentation

## 2014-09-21 DIAGNOSIS — J45909 Unspecified asthma, uncomplicated: Secondary | ICD-10-CM | POA: Insufficient documentation

## 2014-09-21 DIAGNOSIS — Z791 Long term (current) use of non-steroidal anti-inflammatories (NSAID): Secondary | ICD-10-CM | POA: Diagnosis not present

## 2014-09-21 DIAGNOSIS — J029 Acute pharyngitis, unspecified: Secondary | ICD-10-CM | POA: Diagnosis present

## 2014-09-21 DIAGNOSIS — Z792 Long term (current) use of antibiotics: Secondary | ICD-10-CM | POA: Insufficient documentation

## 2014-09-21 DIAGNOSIS — Z8701 Personal history of pneumonia (recurrent): Secondary | ICD-10-CM | POA: Diagnosis not present

## 2014-09-21 DIAGNOSIS — E119 Type 2 diabetes mellitus without complications: Secondary | ICD-10-CM | POA: Insufficient documentation

## 2014-09-21 DIAGNOSIS — Z794 Long term (current) use of insulin: Secondary | ICD-10-CM | POA: Insufficient documentation

## 2014-09-21 MED ORDER — DOXYCYCLINE HYCLATE 100 MG PO CAPS: 100.0000 mg | ORAL_CAPSULE | Freq: Two times a day (BID) | ORAL | Status: DC

## 2014-09-21 NOTE — ED Provider Notes (Signed)
CSN: [id number]     Arrival date & time 09/21/14  1342 History  This chart was scribed for non-physician practitioner Doyce Loose, PA-C, working with Malvin Johns, MD, by Thea Alken, ED Scribe. This patient was seen in room WTR6/WTR6 and the patient's care was started at 4:14 PM.   Chief Complaint  Patient presents with  . Sore Throat   The history is provided by the patient. No language interpreter was used.   Shirley [name] is a 39 y.o. female who presents to the Emergency Department complaining of a hoarseness for the past week. Pt states she's had intermittent hoarseness throughout the week with sore throat but reports she woke up this morning with a knot to left side of her neck. She reports similar knott 2 weeks ago while being seen by PCP. Pt has rhinorrhea but states she has allergies. She denies cough congestion, nausea, emesis.  Pt denies being a smoker.  Pt s/o abscess to right pelvic. She reports some self drainage from abscess consisting of pus.  She has tried warm compresses and peroxide. Pt history of DM.  Past Medical History  Diagnosis Date  . Thrombocytosis     Hem/Onc suggested 2/2 chronic hepatits and/or iron deficiency anemia  . Iron deficiency anemia   . N&V (nausea and vomiting)     Chronic. Unclear etiology with multiple admission and ED visits. CT abdomen with and without contrast (02/2011)  showed no acute process. Gastic Emptying scan (01/2010) was normal. Ultrasound of the abdomen was within normal limits. Hepatitis B viral load was undectable. HIV NR. EGD - gastritis, Hpylori + s/p Rx  . Hypertension   . Hyperlipidemia   . Diabetes mellitus type 2, uncontrolled, with complications   . Recurrent boils   . 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 Cath 12/10- no restenosis or significant CAD progression  . Irregular menses     Small ovarian  follicles seen on SM(2/70)  . History of pyelonephritis     H/o GrpB Pyelonephritis (9/06) and UTI- 07/11- E.Coli, 12/10- GBS  . Abscess of tunica vaginalis     10/09- Abundant S. aureus- sensitive to all abx  . Obesity   . Gastritis   . Depression   . Peripheral neuropathy   . Fibromyalgia   . GERD (gastroesophageal reflux disease)   . Anxiety   . Gastroparesis     secondary to poorly controlled DM, last emptying study performed 01/2010  was normal but may be falsely positive as pt was on reglan  . Blood dyscrasia   . Substance abuse   . MI (myocardial infarction) 2008  . OSA (obstructive sleep apnea)     "suppose to wear mask but I don't" (04/22/2014)  . Seasonal asthma   . Hepatitis B, chronic     Hep BeAb+,Hep B cAb+ & Hep BsAg+ (9/06)  . Migraine     "weekly" (04/22/2014)  . CVA (cerebral vascular accident) ~ 02/2014    denies residual on 04/22/2014  . CVA (cerebral vascular accident)     history of remote right cerebellar infarct noted on head CT at least since 10/2011  . Pneumonia     "this is probably the 2nd or 3rd time I've had pneumonia" (04/22/2014)   Past Surgical History  Procedure Laterality Date  . Cesarean section  1997  . Coronary angioplasty with stent placement  2008    "  2 stents"  . Esophagogastroduodenoscopy N/A 04/23/2014    Procedure: ESOPHAGOGASTRODUODENOSCOPY (EGD);  Surgeon: Winfield Cunas., MD;  Location: [facility] ENDOSCOPY;  Service: Endoscopy;  Laterality: N/A;   Family History  Problem Relation Age of Onset  . Diabetes Father    History  Substance Use Topics  . Smoking status: Former Smoker    Types: Cigarettes    Quit date: 04/24/1996  . Smokeless tobacco: Never Used     Comment: quit smoking cigarettes age 46  . Alcohol Use: Yes     Comment: 04/22/2014 "might have a few drinks a month"   OB History    No data available     Review of Systems  HENT: Positive for rhinorrhea and voice change. Negative for congestion and sore throat.    Respiratory: Negative for cough.   Gastrointestinal: Negative for nausea and vomiting.  Skin: Positive for wound. Negative for color change.  Allergic/Immunologic: Positive for environmental allergies.     Allergies  Lisinopril and Morphine and related  Home Medications   Prior to Admission medications   Medication Sig Start Date End Date Taking? Authorizing Provider  ADVAIR DISKUS 100-50 MCG/DOSE AEPB Inhale 1 puff into the lungs 2 (two) times daily. 09/17/14   Luan Moore, MD  albuterol (PROVENTIL HFA;VENTOLIN HFA) 108 (90 BASE) MCG/ACT inhaler Inhale 2 puffs into the lungs every 4 (four) hours as needed for wheezing or shortness of breath. 04/26/14   Alexa Sherral Hammers, MD  atorvastatin (LIPITOR) 80 MG tablet Take 1 tablet (80 mg total) by mouth daily at 6 PM. 07/25/13   Dixon Boos, MD  BD PEN NEEDLE NANO U/F 32G X 4 MM MISC USE AS DIRECTED 07/31/14   Luan Moore, MD  chlorpheniramine-HYDROcodone (TUSSIONEX) 10-8 MG/5ML LQCR Take 5 mLs by mouth every 12 (twelve) hours as needed for cough. Patient not taking: Reported on 07/20/2014 05/25/14   Luan Moore, MD  dicyclomine (BENTYL) 20 MG tablet Take 1 tablet (20 mg total) by mouth 2 (two) times daily. 12/31/13   [name], MD  doxycycline (VIBRAMYCIN) 100 MG capsule Take 1 capsule (100 mg total) by mouth 2 (two) times daily. 09/21/14   Al Corpus, PA-C  ferrous sulfate 325 (65 FE) MG tablet Take 1 tablet (325 mg total) by mouth 3 (three) times daily with meals. 08/17/14   Luan Moore, MD  gabapentin (NEURONTIN) 300 MG capsule Take 300 mg by mouth 3 (three) times daily.     Historical Provider, MD  insulin aspart (NOVOLOG) 100 UNIT/ML injection Sliding scale- If sugar 150-199: inject 2 unit;  200-249: 4 units, 250-299: 7 units; 300-349: 10 units; Over 350: 12 units PENS PLEASE 05/16/14   Luan Moore, MD  labetalol (NORMODYNE) 100 MG tablet Take 1 tablet (100 mg total) by mouth 2 (two) times daily. 03/05/14   Oval Linsey, MD   LANTUS SOLOSTAR 100 UNIT/ML Solostar Pen Inject 50 Units into the skin daily at 10 pm. 09/16/14   Luan Moore, MD  levofloxacin (LEVAQUIN) 750 MG tablet Take 1 tablet (750 mg total) by mouth daily. Patient not taking: Reported on 06/30/2014 04/26/14   Alexa Sherral Hammers, MD  LORazepam (ATIVAN) 1 MG tablet Take 1 mg by mouth every 8 (eight) hours as needed for anxiety.    Historical Provider, MD  metoCLOPramide (REGLAN) 10 MG tablet Take 1 tablet (10 mg total) by mouth every 6 (six) hours. 08/20/14   Luan Moore, MD  metroNIDAZOLE (FLAGYL) 500 MG tablet Take 1 tablet (500 mg total)  by mouth every 12 (twelve) hours. Patient not taking: Reported on 06/30/2014 04/26/14   Alexa Sherral Hammers, MD  naproxen sodium (ANAPROX) 220 MG tablet Take 440 mg by mouth 2 (two) times daily as needed (pain).    Historical Provider, MD  NOVOLOG FLEXPEN 100 UNIT/ML FlexPen USE ON sliding scale 09/16/14   Luan Moore, MD  omeprazole (PRILOSEC) 40 MG capsule Take 1 capsule (40 mg total) by mouth daily. 08/20/14   Luan Moore, MD  oxyCODONE-acetaminophen (PERCOCET) 5-325 MG per tablet Take 2 tablets by mouth every 4 (four) hours as needed. 07/15/14   Ernestina Patches, MD  promethazine (PHENERGAN) 25 MG tablet Take 1 tablet (25 mg total) by mouth every 6 (six) hours as needed for nausea or vomiting. 07/25/14   [name], MD  senna-docusate (SENOKOT-S) 8.6-50 MG per tablet Take 1 tablet by mouth daily.    Historical Provider, MD   BP 143/99 mmHg  Pulse 95  Temp(Src) 99.2 F (37.3 C) (Oral)  Resp 18  SpO2 100% Physical Exam  Constitutional: She appears well-developed and well-nourished. No distress.  HENT:  Head: Normocephalic and atraumatic.  Nose: Right sinus exhibits no maxillary sinus tenderness and no frontal sinus tenderness. Left sinus exhibits no maxillary sinus tenderness and no frontal sinus tenderness.  Mouth/Throat: Mucous membranes are normal. Posterior oropharyngeal edema and posterior oropharyngeal erythema  present. No oropharyngeal exudate.  Eyes: Conjunctivae and EOM are normal. Right eye exhibits no discharge. Left eye exhibits no discharge.  Neck: Normal range of motion. Neck supple.  Mild cervical adenopathy but no mass  Cardiovascular: Normal rate, regular rhythm and normal heart sounds.   Pulmonary/Chest: Effort normal and breath sounds normal. No respiratory distress. She has no wheezes. She has no rales.  Abdominal: Soft. Bowel sounds are normal. She exhibits no distension. There is no tenderness.  Lymphadenopathy:    She has cervical adenopathy.  Neurological: She is alert. Coordination normal.  Skin: Skin is warm and dry. She is not diaphoretic.  1cm fluctuant mass with spontaneous purulent drainage to right groin without evidence of cellulitis.   Psychiatric: She has a normal mood and affect. Her behavior is normal.  Nursing note and vitals reviewed.  ED Course  Procedures (including critical care time) DIAGNOSTIC STUDIES: Oxygen Saturation is 100% on RA, normal by my interpretation.    COORDINATION OF CARE: 4:27 PM- Pt advised of plan for treatment and pt agrees.  Labs Review Labs Reviewed - No data to display  Imaging Review No results found.   EKG Interpretation None      MDM   Final diagnoses:  Abscess of right groin  Pharyngitis   Patient presents with rhinorrhea, sore throat, hoarseness. No fevers or chills. No cough or congestion. Vitals stable. Patient with pharyngitis without evidence of exudates. Patient likely with viral syndrome. Treatment symptomatically. Patient also with right groin abscess with spontaneous drainage. Patient refusing IND. Patient with history of diabetes. We'll treat with doxycycline. Patient to follow-up with PCP for recheck and for persistent pharyngitis.  Discussed return precautions with patient. Discussed all results and patient verbalizes understanding and agrees with plan.  I personally performed the services described in  this documentation, which was scribed in my presence. The recorded information has been reviewed and is accurate.   Al Corpus, PA-C 09/21/14 [place], MD 09/21/14 [phone]

## 2014-09-21 NOTE — ED Notes (Signed)
Pt here to be seen for "throat issues". Registration came to report to this RN that pt is eating chips in the lobby.

## 2014-09-21 NOTE — Discharge Instructions (Signed)
Return to the emergency room with worsening of symptoms, new symptoms or with symptoms that are concerning, , especially fevers, stiff neck, worsening headache, nausea/vomiting, visual changes or slurred speech, chest pain, shortness of breath, cough with thick colored mucous or blood Drink plenty of fluids with electrolytes especially Gatorade. OTC cold medications such as mucinex, nyquil, dayquil are recommended. Chloraseptic for sore throat. Claritin, Zyrtec or Allegra for your allergies. Please take all of your antibiotics until finished!   You may develop abdominal discomfort or diarrhea from the antibiotic.  You may help offset this with probiotics which you can buy or get in yogurt. Do not eat  or take the probiotics until 2 hours after your antibiotic.  Call to make a follow-up appointment with your doctor for abscess recheck as well as follow-up for throat soreness. Continue with warm compresses. Read below information and follow recommendations.   Abscess Care After An abscess (also called a boil or furuncle) is an infected area that contains a collection of pus. Signs and symptoms of an abscess include pain, tenderness, redness, or hardness, or you may feel a moveable soft area under your skin. An abscess can occur anywhere in the body. The infection may spread to surrounding tissues causing cellulitis. A cut (incision) by the surgeon was made over your abscess and the pus was drained out. Gauze may have been packed into the space to provide a drain that will allow the cavity to heal from the inside outwards. The boil may be painful for 5 to 7 days. Most people with a boil do not have high fevers. Your abscess, if seen early, may not have localized, and may not have been lanced. If not, another appointment may be required for this if it does not get better on its own or with medications. HOME CARE INSTRUCTIONS   Only take over-the-counter or prescription medicines for pain, discomfort, or  fever as directed by your caregiver.  When you bathe, soak and then remove gauze or iodoform packs at least daily or as directed by your caregiver. You may then wash the wound gently with mild soapy water. Repack with gauze or do as your caregiver directs. SEEK IMMEDIATE MEDICAL CARE IF:   You develop increased pain, swelling, redness, drainage, or bleeding in the wound site.  You develop signs of generalized infection including muscle aches, chills, fever, or a general ill feeling.  An oral temperature above 102 F (38.9 C) develops, not controlled by medication. See your caregiver for a recheck if you develop any of the symptoms described above. If medications (antibiotics) were prescribed, take them as directed. Document Released: 10/27/2004 Document Revised: 07/03/2011 Document Reviewed: 06/24/2007 [facility] Patient Information 2015 [place], [place]. This information is not intended to replace advice given to you by your health care provider. Make sure you discuss any questions you have with your health care provider.  Pharyngitis Pharyngitis is redness, pain, and swelling (inflammation) of your pharynx.  CAUSES  Pharyngitis is usually caused by infection. Most of the time, these infections are from viruses (viral) and are part of a cold. However, sometimes pharyngitis is caused by bacteria (bacterial). Pharyngitis can also be caused by allergies. Viral pharyngitis may be spread from person to person by coughing, sneezing, and personal items or utensils (cups, forks, spoons, toothbrushes). Bacterial pharyngitis may be spread from person to person by more intimate contact, such as kissing.  SIGNS AND SYMPTOMS  Symptoms of pharyngitis include:   Sore throat.   Tiredness (fatigue).  Low-grade fever.   Headache.  Joint pain and muscle aches.  Skin rashes.  Swollen lymph nodes.  Plaque-like film on throat or tonsils (often seen with bacterial pharyngitis). DIAGNOSIS  Your  health care provider will ask you questions about your illness and your symptoms. Your medical history, along with a physical exam, is often all that is needed to diagnose pharyngitis. Sometimes, a rapid strep test is done. Other lab tests may also be done, depending on the suspected cause.  TREATMENT  Viral pharyngitis will usually get better in 3-4 days without the use of medicine. Bacterial pharyngitis is treated with medicines that kill germs (antibiotics).  HOME CARE INSTRUCTIONS   Drink enough water and fluids to keep your urine clear or pale yellow.   Only take over-the-counter or prescription medicines as directed by your health care provider:   If you are prescribed antibiotics, make sure you finish them even if you start to feel better.   Do not take aspirin.   Get lots of rest.   Gargle with 8 oz of salt water ( tsp of salt per 1 qt of water) as often as every 1-2 hours to soothe your throat.   Throat lozenges (if you are not at risk for choking) or sprays may be used to soothe your throat. SEEK MEDICAL CARE IF:   You have large, tender lumps in your neck.  You have a rash.  You cough up green, yellow-brown, or bloody spit. SEEK IMMEDIATE MEDICAL CARE IF:   Your neck becomes stiff.  You drool or are unable to swallow liquids.  You vomit or are unable to keep medicines or liquids down.  You have severe pain that does not go away with the use of recommended medicines.  You have trouble breathing (not caused by a stuffy nose). MAKE SURE YOU:   Understand these instructions.  Will watch your condition.  Will get help right away if you are not doing well or get worse. Document Released: 04/10/2005 Document Revised: 01/29/2013 Document Reviewed: 12/16/2012 [facility] Patient Information 2015 [place], [place]. This information is not intended to replace advice given to you by your health care provider. Make sure you discuss any questions you have with your health  care provider.

## 2014-09-21 NOTE — ED Notes (Signed)
Pt is a visitor for a pt in psych.  Pt states that she wants to check in for sore throat, hoarseness, and an abscess under her abd.  Sore throat x 2 days and abscess x 1 month.  Pt eating spicy chips during triage.

## 2014-09-25 ENCOUNTER — Ambulatory Visit: Payer: Medicaid Other | Admitting: Internal Medicine

## 2014-10-13 ENCOUNTER — Encounter (HOSPITAL_COMMUNITY): Payer: Self-pay | Admitting: Nurse Practitioner

## 2014-10-13 ENCOUNTER — Inpatient Hospital Stay (HOSPITAL_COMMUNITY)
Admission: EM | Admit: 2014-10-13 | Discharge: 2014-10-15 | DRG: 637 | Disposition: A | Discharge status: Home / Self Care | Payer: Medicaid Other | Attending: Oncology | Admitting: Oncology

## 2014-10-13 ENCOUNTER — Inpatient Hospital Stay (HOSPITAL_COMMUNITY): Payer: Medicaid Other

## 2014-10-13 DIAGNOSIS — E1165 Type 2 diabetes mellitus with hyperglycemia: Secondary | ICD-10-CM | POA: Diagnosis not present

## 2014-10-13 DIAGNOSIS — K920 Hematemesis: Secondary | ICD-10-CM | POA: Diagnosis not present

## 2014-10-13 DIAGNOSIS — Z6832 Body mass index (BMI) 32.0-32.9, adult: Secondary | ICD-10-CM | POA: Diagnosis not present

## 2014-10-13 DIAGNOSIS — F1910 Other psychoactive substance abuse, uncomplicated: Secondary | ICD-10-CM | POA: Diagnosis present

## 2014-10-13 DIAGNOSIS — E785 Hyperlipidemia, unspecified: Secondary | ICD-10-CM | POA: Diagnosis present

## 2014-10-13 DIAGNOSIS — R05 Cough: Secondary | ICD-10-CM

## 2014-10-13 DIAGNOSIS — R1032 Left lower quadrant pain: Secondary | ICD-10-CM

## 2014-10-13 DIAGNOSIS — E1140 Type 2 diabetes mellitus with diabetic neuropathy, unspecified: Secondary | ICD-10-CM

## 2014-10-13 DIAGNOSIS — R059 Cough, unspecified: Secondary | ICD-10-CM | POA: Insufficient documentation

## 2014-10-13 DIAGNOSIS — G4733 Obstructive sleep apnea (adult) (pediatric): Secondary | ICD-10-CM | POA: Diagnosis present

## 2014-10-13 DIAGNOSIS — K3184 Gastroparesis: Secondary | ICD-10-CM | POA: Diagnosis present

## 2014-10-13 DIAGNOSIS — D259 Leiomyoma of uterus, unspecified: Secondary | ICD-10-CM | POA: Diagnosis present

## 2014-10-13 DIAGNOSIS — K219 Gastro-esophageal reflux disease without esophagitis: Secondary | ICD-10-CM | POA: Diagnosis present

## 2014-10-13 DIAGNOSIS — E1110 Type 2 diabetes mellitus with ketoacidosis without coma: Secondary | ICD-10-CM | POA: Diagnosis present

## 2014-10-13 DIAGNOSIS — D509 Iron deficiency anemia, unspecified: Secondary | ICD-10-CM | POA: Diagnosis present

## 2014-10-13 DIAGNOSIS — F329 Major depressive disorder, single episode, unspecified: Secondary | ICD-10-CM | POA: Diagnosis present

## 2014-10-13 DIAGNOSIS — B181 Chronic viral hepatitis B without delta-agent: Secondary | ICD-10-CM | POA: Diagnosis present

## 2014-10-13 DIAGNOSIS — D72829 Elevated white blood cell count, unspecified: Secondary | ICD-10-CM | POA: Diagnosis present

## 2014-10-13 DIAGNOSIS — I10 Essential (primary) hypertension: Secondary | ICD-10-CM | POA: Diagnosis present

## 2014-10-13 DIAGNOSIS — J45909 Unspecified asthma, uncomplicated: Secondary | ICD-10-CM | POA: Diagnosis present

## 2014-10-13 DIAGNOSIS — Z7982 Long term (current) use of aspirin: Secondary | ICD-10-CM

## 2014-10-13 DIAGNOSIS — D500 Iron deficiency anemia secondary to blood loss (chronic): Secondary | ICD-10-CM | POA: Diagnosis present

## 2014-10-13 DIAGNOSIS — D473 Essential (hemorrhagic) thrombocythemia: Secondary | ICD-10-CM | POA: Diagnosis present

## 2014-10-13 DIAGNOSIS — Z79899 Other long term (current) drug therapy: Secondary | ICD-10-CM

## 2014-10-13 DIAGNOSIS — F419 Anxiety disorder, unspecified: Secondary | ICD-10-CM | POA: Diagnosis present

## 2014-10-13 DIAGNOSIS — F1410 Cocaine abuse, uncomplicated: Secondary | ICD-10-CM

## 2014-10-13 DIAGNOSIS — M797 Fibromyalgia: Secondary | ICD-10-CM | POA: Diagnosis present

## 2014-10-13 DIAGNOSIS — Z8673 Personal history of transient ischemic attack (TIA), and cerebral infarction without residual deficits: Secondary | ICD-10-CM | POA: Diagnosis not present

## 2014-10-13 DIAGNOSIS — I259 Chronic ischemic heart disease, unspecified: Secondary | ICD-10-CM | POA: Diagnosis present

## 2014-10-13 DIAGNOSIS — R000 Tachycardia, unspecified: Secondary | ICD-10-CM | POA: Diagnosis present

## 2014-10-13 DIAGNOSIS — Z7951 Long term (current) use of inhaled steroids: Secondary | ICD-10-CM

## 2014-10-13 DIAGNOSIS — K226 Gastro-esophageal laceration-hemorrhage syndrome: Secondary | ICD-10-CM | POA: Diagnosis present

## 2014-10-13 DIAGNOSIS — I2510 Atherosclerotic heart disease of native coronary artery without angina pectoris: Secondary | ICD-10-CM | POA: Diagnosis present

## 2014-10-13 DIAGNOSIS — Z9114 Patient's other noncompliance with medication regimen: Secondary | ICD-10-CM | POA: Diagnosis present

## 2014-10-13 DIAGNOSIS — E872 Acidosis: Secondary | ICD-10-CM | POA: Insufficient documentation

## 2014-10-13 DIAGNOSIS — Z794 Long term (current) use of insulin: Secondary | ICD-10-CM | POA: Diagnosis not present

## 2014-10-13 DIAGNOSIS — E1310 Other specified diabetes mellitus with ketoacidosis without coma: Principal | ICD-10-CM | POA: Diagnosis present

## 2014-10-13 DIAGNOSIS — Z87891 Personal history of nicotine dependence: Secondary | ICD-10-CM

## 2014-10-13 DIAGNOSIS — F32A Depression, unspecified: Secondary | ICD-10-CM | POA: Diagnosis present

## 2014-10-13 DIAGNOSIS — E1143 Type 2 diabetes mellitus with diabetic autonomic (poly)neuropathy: Secondary | ICD-10-CM | POA: Diagnosis present

## 2014-10-13 DIAGNOSIS — I252 Old myocardial infarction: Secondary | ICD-10-CM

## 2014-10-13 DIAGNOSIS — G8929 Other chronic pain: Secondary | ICD-10-CM | POA: Diagnosis present

## 2014-10-13 DIAGNOSIS — E8729 Other acidosis: Secondary | ICD-10-CM | POA: Insufficient documentation

## 2014-10-13 DIAGNOSIS — Z862 Personal history of diseases of the blood and blood-forming organs and certain disorders involving the immune mechanism: Secondary | ICD-10-CM

## 2014-10-13 LAB — BASIC METABOLIC PANEL
ANION GAP: 7 (ref 5–15)
Anion gap: 12 (ref 5–15)
BUN: 17 mg/dL (ref 6–20)
BUN: 19 mg/dL (ref 6–20)
CALCIUM: 8.5 mg/dL — AB (ref 8.9–10.3)
CHLORIDE: 111 mmol/L (ref 101–111)
CO2: 20 mmol/L — AB (ref 22–32)
CO2: 22 mmol/L (ref 22–32)
Calcium: 8.5 mg/dL — ABNORMAL LOW (ref 8.9–10.3)
Chloride: 105 mmol/L (ref 101–111)
Creatinine, Ser: 1.19 mg/dL — ABNORMAL HIGH (ref 0.44–1.00)
Creatinine, Ser: 1.49 mg/dL — ABNORMAL HIGH (ref 0.44–1.00)
GFR calc Af Amer: >60 mL/min (ref >60)
GFR calc Af Amer: 49 mL/min — ABNORMAL LOW (ref >60)
GFR calc non Af Amer: 56 mL/min — ABNORMAL LOW (ref >60)
GFR calc non Af Amer: 43 mL/min — ABNORMAL LOW (ref >60)
GLUCOSE: 285 mg/dL — AB (ref 65–99)
Glucose, Bld: 99 mg/dL (ref 65–99)
Potassium: 3.7 mmol/L (ref 3.5–5.1)
Potassium: 3.9 mmol/L (ref 3.5–5.1)
SODIUM: 140 mmol/L (ref 135–145)
Sodium: 137 mmol/L (ref 135–145)

## 2014-10-13 LAB — GLUCOSE, CAPILLARY
GLUCOSE-CAPILLARY: 173 mg/dL — AB (ref 65–99)
GLUCOSE-CAPILLARY: 133 mg/dL — AB (ref 65–99)
GLUCOSE-CAPILLARY: 92 mg/dL (ref 65–99)
GLUCOSE-CAPILLARY: 152 mg/dL — AB (ref 65–99)
GLUCOSE-CAPILLARY: 134 mg/dL — AB (ref 65–99)
Glucose-Capillary: 219 mg/dL — ABNORMAL HIGH (ref 65–99)
Glucose-Capillary: 94 mg/dL (ref 65–99)
Glucose-Capillary: 276 mg/dL — ABNORMAL HIGH (ref 65–99)
Glucose-Capillary: 157 mg/dL — ABNORMAL HIGH (ref 65–99)
Glucose-Capillary: 141 mg/dL — ABNORMAL HIGH (ref 65–99)
Glucose-Capillary: 144 mg/dL — ABNORMAL HIGH (ref 65–99)

## 2014-10-13 LAB — CBC
HCT: 32.5 % — ABNORMAL LOW (ref 36.0–46.0)
Hemoglobin: 11 g/dL — ABNORMAL LOW (ref 12.0–15.0)
MCH: 24.4 pg — ABNORMAL LOW (ref 26.0–34.0)
MCHC: 33.8 g/dL (ref 30.0–36.0)
MCV: 72.2 fL — ABNORMAL LOW (ref 78.0–100.0)
Platelets: 431 10*3/uL — ABNORMAL HIGH (ref 150–400)
RBC: 4.5 MIL/uL (ref 3.87–5.11)
RDW: 15.7 % — ABNORMAL HIGH (ref 11.5–15.5)
WBC: 14.7 10*3/uL — ABNORMAL HIGH (ref 4.0–10.5)

## 2014-10-13 LAB — PREGNANCY, URINE: PREG TEST UR: NEGATIVE

## 2014-10-13 LAB — CBC WITH DIFFERENTIAL/PLATELET
BASOS PCT: 0 % (ref 0–1)
Basophils Absolute: 0 10*3/uL (ref 0.0–0.1)
EOS ABS: 0 10*3/uL (ref 0.0–0.7)
Eosinophils Relative: 0 % (ref 0–5)
HEMATOCRIT: 40.3 % (ref 36.0–46.0)
Hemoglobin: 14.1 g/dL (ref 12.0–15.0)
LYMPHS ABS: 2 10*3/uL (ref 0.7–4.0)
LYMPHS PCT: 13 % (ref 12–46)
MCH: 24.9 pg — ABNORMAL LOW (ref 26.0–34.0)
MCHC: 35 g/dL (ref 30.0–36.0)
MCV: 71.2 fL — AB (ref 78.0–100.0)
Monocytes Absolute: 0.7 10*3/uL (ref 0.1–1.0)
Monocytes Relative: 4 % (ref 3–12)
Neutro Abs: 13.1 10*3/uL — ABNORMAL HIGH (ref 1.7–7.7)
Neutrophils Relative %: 83 % — ABNORMAL HIGH (ref 43–77)
PLATELETS: 496 10*3/uL — AB (ref 150–400)
RBC: 5.66 MIL/uL — ABNORMAL HIGH (ref 3.87–5.11)
RDW: 15.5 % (ref 11.5–15.5)
WBC: 15.8 10*3/uL — ABNORMAL HIGH (ref 4.0–10.5)

## 2014-10-13 LAB — COMPREHENSIVE METABOLIC PANEL
ALBUMIN: 3.9 g/dL (ref 3.5–5.0)
ALT: 9 U/L — ABNORMAL LOW (ref 14–54)
AST: 21 U/L (ref 15–41)
Alkaline Phosphatase: 78 U/L (ref 38–126)
Anion gap: 17 — ABNORMAL HIGH (ref 5–15)
BUN: 14 mg/dL (ref 6–20)
CO2: 19 mmol/L — ABNORMAL LOW (ref 22–32)
CREATININE: 1.19 mg/dL — AB (ref 0.44–1.00)
Calcium: 10 mg/dL (ref 8.9–10.3)
Chloride: 98 mmol/L — ABNORMAL LOW (ref 101–111)
GFR calc Af Amer: >60 mL/min (ref >60)
GFR calc non Af Amer: 56 mL/min — ABNORMAL LOW (ref >60)
Glucose, Bld: 362 mg/dL — ABNORMAL HIGH (ref 65–99)
Potassium: 4 mmol/L (ref 3.5–5.1)
Sodium: 134 mmol/L — ABNORMAL LOW (ref 135–145)
TOTAL PROTEIN: 9.5 g/dL — AB (ref 6.5–8.1)
Total Bilirubin: 1 mg/dL (ref 0.3–1.2)

## 2014-10-13 LAB — TROPONIN I: Troponin I: <0.03 ng/mL (ref <0.031)

## 2014-10-13 LAB — URINE MICROSCOPIC-ADD ON

## 2014-10-13 LAB — RAPID URINE DRUG SCREEN, HOSP PERFORMED
Amphetamines: NOT DETECTED
Barbiturates: NOT DETECTED
Benzodiazepines: NOT DETECTED
Cocaine: POSITIVE — AB
OPIATES: POSITIVE — AB
Tetrahydrocannabinol: NOT DETECTED

## 2014-10-13 LAB — URINALYSIS, ROUTINE W REFLEX MICROSCOPIC
Glucose, UA: >1000 mg/dL — AB
HGB URINE DIPSTICK: NEGATIVE
Ketones, ur: 40 mg/dL — AB
Leukocytes, UA: NEGATIVE
NITRITE: NEGATIVE
Protein, ur: 30 mg/dL — AB
SPECIFIC GRAVITY, URINE: 1.033 — AB (ref 1.005–1.030)
Urobilinogen, UA: 0.2 mg/dL (ref 0.0–1.0)
pH: 5.5 (ref 5.0–8.0)

## 2014-10-13 LAB — LACTIC ACID, PLASMA: Lactic Acid, Venous: 1.6 mmol/L (ref 0.5–2.0)

## 2014-10-13 LAB — I-STAT TROPONIN, ED: TROPONIN I, POC: 0.02 ng/mL (ref 0.00–0.08)

## 2014-10-13 LAB — LIPASE, BLOOD: LIPASE: 19 U/L — AB (ref 22–51)

## 2014-10-13 LAB — SALICYLATE LEVEL: Salicylate Lvl: <4 mg/dL (ref 2.8–30.0)

## 2014-10-13 LAB — CBG MONITORING, ED
GLUCOSE-CAPILLARY: 309 mg/dL — AB (ref 65–99)
Glucose-Capillary: 278 mg/dL — ABNORMAL HIGH (ref 65–99)

## 2014-10-13 MED ORDER — INSULIN GLARGINE 100 UNIT/ML ~~LOC~~ SOLN
18.0000 [IU] | Freq: Once | SUBCUTANEOUS | Status: AC
  Administered 2014-10-13: 18 [IU] via SUBCUTANEOUS
  Filled 2014-10-13: qty 0.18

## 2014-10-13 MED ORDER — METOCLOPRAMIDE HCL 5 MG/ML IJ SOLN
10.0000 mg | Freq: Once | INTRAMUSCULAR | Status: AC
Start: 2014-10-13 — End: 2014-10-13
  Administered 2014-10-13: 10 mg via INTRAVENOUS
  Filled 2014-10-13: qty 2

## 2014-10-13 MED ORDER — KETOROLAC TROMETHAMINE 30 MG/ML IJ SOLN
30.0000 mg | Freq: Once | INTRAMUSCULAR | Status: AC
  Administered 2014-10-13: 30 mg via INTRAVENOUS
  Filled 2014-10-13: qty 1

## 2014-10-13 MED ORDER — POTASSIUM CHLORIDE IN NACL 20-0.9 MEQ/L-% IV SOLN
INTRAVENOUS | Status: DC
Start: 2014-10-13 — End: 2014-10-14
  Administered 2014-10-13 – 2014-10-14 (×2): via INTRAVENOUS
  Filled 2014-10-13 (×2): qty 1000

## 2014-10-13 MED ORDER — SODIUM CHLORIDE 0.9 % IV BOLUS (SEPSIS)
1000.0000 mL | Freq: Once | INTRAVENOUS | Status: AC
  Administered 2014-10-13: 1000 mL via INTRAVENOUS

## 2014-10-13 MED ORDER — MORPHINE SULFATE 4 MG/ML IJ SOLN
4.0000 mg | Freq: Once | INTRAMUSCULAR | Status: AC
  Administered 2014-10-13: 4 mg via INTRAVENOUS
  Filled 2014-10-13: qty 1

## 2014-10-13 MED ORDER — SODIUM CHLORIDE 0.9 % IV SOLN
INTRAVENOUS | Status: DC
  Filled 2014-10-13: qty 2.5

## 2014-10-13 MED ORDER — INSULIN ASPART 100 UNIT/ML ~~LOC~~ SOLN
0.0000 [IU] | Freq: Three times a day (TID) | SUBCUTANEOUS | Status: DC
  Administered 2014-10-14: 8 [IU] via SUBCUTANEOUS

## 2014-10-13 MED ORDER — PROMETHAZINE HCL 25 MG/ML IJ SOLN: 12.5000 mg | Freq: Four times a day (QID) | INTRAMUSCULAR | Status: DC | PRN

## 2014-10-13 MED ORDER — BENZONATATE 100 MG PO CAPS
100.0000 mg | ORAL_CAPSULE | Freq: Two times a day (BID) | ORAL | Status: DC | PRN
  Administered 2014-10-13 – 2014-10-14 (×3): 100 mg via ORAL
  Filled 2014-10-13 (×3): qty 1

## 2014-10-13 MED ORDER — DIPHENHYDRAMINE HCL 50 MG/ML IJ SOLN
25.0000 mg | Freq: Once | INTRAMUSCULAR | Status: AC
  Administered 2014-10-13: 25 mg via INTRAVENOUS
  Filled 2014-10-13: qty 1

## 2014-10-13 MED ORDER — LORAZEPAM 1 MG PO TABS
1.0000 mg | ORAL_TABLET | Freq: Every day | ORAL | Status: DC | PRN
  Administered 2014-10-14 – 2014-10-15 (×2): 1 mg via ORAL
  Filled 2014-10-13 (×2): qty 1

## 2014-10-13 MED ORDER — POTASSIUM CHLORIDE IN NACL 20-0.9 MEQ/L-% IV SOLN
INTRAVENOUS | Status: DC
  Filled 2014-10-13 (×2): qty 1000

## 2014-10-13 MED ORDER — DEXTROSE-NACL 5-0.45 % IV SOLN
INTRAVENOUS | Status: DC
  Administered 2014-10-13: 06:00:00 via INTRAVENOUS

## 2014-10-13 MED ORDER — DEXTROSE-NACL 5-0.45 % IV SOLN: INTRAVENOUS | Status: DC

## 2014-10-13 MED ORDER — SODIUM CHLORIDE 0.9 % IJ SOLN
3.0000 mL | Freq: Two times a day (BID) | INTRAMUSCULAR | Status: DC
  Administered 2014-10-13 – 2014-10-14 (×4): 3 mL via INTRAVENOUS

## 2014-10-13 MED ORDER — IPRATROPIUM-ALBUTEROL 0.5-2.5 (3) MG/3ML IN SOLN
3.0000 mL | Freq: Three times a day (TID) | RESPIRATORY_TRACT | Status: DC
  Administered 2014-10-13: 3 mL via RESPIRATORY_TRACT
  Filled 2014-10-13: qty 3

## 2014-10-13 MED ORDER — SODIUM CHLORIDE 0.9 % IV SOLN
INTRAVENOUS | Status: DC
  Administered 2014-10-13: 18:00:00 via INTRAVENOUS

## 2014-10-13 MED ORDER — ACETAMINOPHEN 650 MG RE SUPP: 650.0000 mg | Freq: Four times a day (QID) | RECTAL | Status: DC | PRN

## 2014-10-13 MED ORDER — PANTOPRAZOLE SODIUM 40 MG IV SOLR
40.0000 mg | Freq: Once | INTRAVENOUS | Status: AC
  Administered 2014-10-13: 40 mg via INTRAVENOUS
  Filled 2014-10-13: qty 40

## 2014-10-13 MED ORDER — INSULIN REGULAR HUMAN 100 UNIT/ML IJ SOLN
INTRAMUSCULAR | Status: DC
  Administered 2014-10-13: 2.5 [IU]/h via INTRAVENOUS
  Filled 2014-10-13: qty 2.5

## 2014-10-13 MED ORDER — ONDANSETRON HCL 4 MG/2ML IJ SOLN
4.0000 mg | Freq: Once | INTRAMUSCULAR | Status: AC
  Administered 2014-10-13: 4 mg via INTRAVENOUS
  Filled 2014-10-13: qty 2

## 2014-10-13 MED ORDER — KCL IN DEXTROSE-NACL 20-5-0.45 MEQ/L-%-% IV SOLN
INTRAVENOUS | Status: DC
  Administered 2014-10-13: 11:00:00 via INTRAVENOUS
  Filled 2014-10-13 (×2): qty 1000

## 2014-10-13 MED ORDER — MOMETASONE FURO-FORMOTEROL FUM 100-5 MCG/ACT IN AERO
2.0000 | INHALATION_SPRAY | Freq: Two times a day (BID) | RESPIRATORY_TRACT | Status: DC
  Administered 2014-10-13 – 2014-10-15 (×6): 2 via RESPIRATORY_TRACT
  Filled 2014-10-13: qty 8.8

## 2014-10-13 MED ORDER — ACETAMINOPHEN 325 MG PO TABS
650.0000 mg | ORAL_TABLET | Freq: Four times a day (QID) | ORAL | Status: DC | PRN
  Administered 2014-10-13 – 2014-10-15 (×4): 650 mg via ORAL
  Filled 2014-10-13 (×4): qty 2

## 2014-10-13 MED ORDER — INSULIN ASPART 100 UNIT/ML ~~LOC~~ SOLN
0.0000 [IU] | SUBCUTANEOUS | Status: DC
  Administered 2014-10-13: 2 [IU] via SUBCUTANEOUS

## 2014-10-13 MED ORDER — OXYCODONE-ACETAMINOPHEN 5-325 MG PO TABS
1.0000 | ORAL_TABLET | Freq: Three times a day (TID) | ORAL | Status: DC | PRN
  Administered 2014-10-13 (×2): 1 via ORAL
  Filled 2014-10-13 (×3): qty 1

## 2014-10-13 MED ORDER — PANTOPRAZOLE SODIUM 40 MG IV SOLR
40.0000 mg | Freq: Two times a day (BID) | INTRAVENOUS | Status: DC
  Administered 2014-10-13 – 2014-10-14 (×3): 40 mg via INTRAVENOUS
  Filled 2014-10-13 (×4): qty 40

## 2014-10-13 MED ORDER — IPRATROPIUM-ALBUTEROL 0.5-2.5 (3) MG/3ML IN SOLN: 3.0000 mL | Freq: Four times a day (QID) | RESPIRATORY_TRACT | Status: DC | PRN

## 2014-10-13 NOTE — Progress Notes (Addendum)
Inpatient Diabetes Program Recommendations  AACE/ADA: New Consensus Statement on Inpatient Glycemic Control (2013)  Target Ranges:  Prepandial:   less than 140 mg/dL      Peak postprandial:   less than 180 mg/dL (1-2 hours)      Critically ill patients:  140 - 180 mg/dL   Results for Shirley [name], Shirley [name] (MRN [id number]) as of 10/13/2014 09:26  Ref. Range 10/13/2014 03:19 10/13/2014 04:47 10/13/2014 05:54 10/13/2014 06:56 10/13/2014 08:11  Glucose-Capillary Latest Ref Range: 65-99 mg/dL 309 (H) 278 (H) 219 (H) 173 (H) 133 (H)   Reason for Visit: DKA  Diabetes history: DM 2 Outpatient Diabetes medications: Lantus 50 units QHS, Novolog 2-12 units TID Current orders for Inpatient glycemic control: IV insulin  BMET due now. Will see CO2 level. Last one at 5 am and was 20. Anion gap at that time WNL.  Inpatient Diabetes Program Recommendations Insulin - Basal: When it is time to transition off IV insulin, please consider ordering at least half of patient's home dose of basal insulin, Lantus 25 units Q24hrs.   A1c 11.1 on 07/21/14  DM coordinator saw patient in December 2015. Trough previous records at that time and the patient's actions, patient has [place] interest in caring for her DM at home.  Thanks,   Tama Headings RN, MSN, [facility] Inpatient Diabetes Coordinator Team Pager [phone]

## 2014-10-13 NOTE — ED Notes (Signed)
Admission md at bedside

## 2014-10-13 NOTE — Care Management Note (Addendum)
Case Management Note  Patient Details  Name: Shirley [name] MRN: [id number] Date of Birth: 01-Apr-1976  Subjective/Objective:           CM following for progression and d/c planning.         Action/Plan: Noted referral for this pt to be followed by [facility], call placed to Maudie Mercury, transition RN , re this pt and need for community followup. Await return call .  CM will ask for [facility] for medication management and Diabetes management for this pt.   Expected Discharge Date:       10/15/2014           Expected Discharge Plan:  [place]  In-House Referral:  NA  Discharge planning Services  CM Consult  Post Acute Care Choice:  Home Health Choice offered to:  Patient  DME Arranged:    DME Agency:     HH Arranged:  RN [place] Agency:     Status of Service:  In process, will continue to follow  Medicare Important Message Given:  No Date Medicare IM Given:    Medicare IM give by:    Date Additional Medicare IM Given:    Additional Medicare Important Message give by:     If discussed at [place] of Stay Meetings, dates discussed:    Additional Comments:  Adron Bene, RN 10/13/2014, 2:17 PM

## 2014-10-13 NOTE — ED Notes (Signed)
Dr. Delo at bedside. 

## 2014-10-13 NOTE — H&P (Signed)
Date: 10/13/2014               Patient Name:  Shirley [name] MRN: [id number]  DOB: Mar 23, 1976 Age / Sex: 39 y.o., female   PCP: Collier Salina, MD         Medical Service: Internal Medicine Teaching Service         Attending Physician: Dr. Beryle Beams    First Contact: Dr. Arcelia Jew Pager: [phone]  Second Contact: Dr. Gordy Levan Pager: [phone]       After Hours (After 5p/  First Contact Pager: [phone]  weekends / holidays): Second Contact Pager: [phone]   Chief Complaint: hematemesis  History of Present Illness: 39 year old female with chronic abdominal pain, poorly controlled type 2 diabetes w/ neuropathy and gastroparesis, hypertension, hyperlipidemia, coronary artery disease status post angioplasty, morbid obesity, history of CVA, obstructive sleep apnea, polysubstance abuse who presented to the ED with hematemesis. She has been vomiting bile colored fluid x 4 days and has been unable to tolerate po or liquids. She then started having hematemesis 1 day after the onset of her vomiting. She states the amount of blood in her vomit has remained consistent. Denies hx of hematemesis, she denies sore throat. She took 2 tablets of advil today for abd pain and asa daily. She has chronic LLQ abd pain. She has dizziness and fatigue. Denies dark stools, hematochezia, and weakness. She states that prior to onset of vomiting she starting feeling like she was getting the flu as she was having a productive cough of green phlegm, SOB, fevers, and wheezing. Wheezing did not improve w/ her home inhalers. She was also having chest pain that is located at her upper mid epigastrium that does not radiate and feels like a sharp sensation. She has had chest pain like this in the past. She states chest pain is intermittent and she cannot tell what brings the pain on or off. She rates chest pain as 7/10. She last used cocaine 4 days ago. She has a long hx of chronic n/v that has never been controlled. She was last  hospitalized in 06/2013 for n/v and chest pain. Chest pain was thought to be due to recent cocaine use and n/v. Nausea and vomiting was likely 2/2 Dm gastroparesis and cocaine use which can delay gastric emptying. She had an EGD in 03/2014 with findings suggestive of gastroparesis. Biopsy  findings are consistent with active gastritis. She is unable to get a gastric emptying study b/c she is allergic to eggs. She is on Reglan 10 mg every 6 hours and Prilosec 20 mg though but has not been taking any of her medications x 2 weeks including her insulin. She states her CBGs range from the 200-400's at home. She has had 12 CT abd/pelvis since 2006 w/ most recent one on 12/31/2013 WNL. On exam pt had 100cc of hematemesis in her emesis bag. In the ED, she was given Reglan 10 mg IV, Zofran 4 mg IV, protonix 40mg  IV, toradol 30mg  IV, morphine 4mg  IV, and 2 L normal saline bolus.   Meds: Current Facility-Administered Medications  Medication Dose Route Frequency Provider Last Rate Last Dose  . dextrose 5 %-0.45 % sodium chloride infusion   Intravenous Continuous Veryl Speak, MD      . insulin regular (NOVOLIN R,HUMULIN R) 250 Units in sodium chloride 0.9 % 250 mL (1 Units/mL) infusion   Intravenous Continuous Veryl Speak, MD       Current Outpatient Prescriptions  Medication Sig Dispense Refill  .  ADVAIR DISKUS 100-50 MCG/DOSE AEPB Inhale 1 puff into the lungs 2 (two) times daily. 60 each 2  . albuterol (PROVENTIL HFA;VENTOLIN HFA) 108 (90 BASE) MCG/ACT inhaler Inhale 2 puffs into the lungs every 4 (four) hours as needed for wheezing or shortness of breath. 1 Inhaler 4  . atorvastatin (LIPITOR) 80 MG tablet Take 1 tablet (80 mg total) by mouth daily at 6 PM. 30 tablet 2  . gabapentin (NEURONTIN) 300 MG capsule Take 300 mg by mouth 3 (three) times daily.     . insulin aspart (NOVOLOG) 100 UNIT/ML injection Sliding scale- If sugar 150-199: inject 2 unit;  200-249: 4 units, 250-299: 7 units; 300-349: 10 units; Over  350: 12 units PENS PLEASE 10 mL 2  . LANTUS SOLOSTAR 100 UNIT/ML Solostar Pen Inject 50 Units into the skin daily at 10 pm. 15 mL 0  . LORazepam (ATIVAN) 1 MG tablet Take 1 mg by mouth every 8 (eight) hours as needed for anxiety.    Marland Kitchen omeprazole (PRILOSEC) 40 MG capsule Take 1 capsule (40 mg total) by mouth daily. 30 capsule 2  . ADVAIR DISKUS 100-50 MCG/DOSE AEPB Inhale 1 puff into the lungs 2 (two) times daily. (Patient not taking: Reported on 10/13/2014) 60 each 1  . BD PEN NEEDLE NANO U/F 32G X 4 MM MISC USE AS DIRECTED 100 each 6  . chlorpheniramine-HYDROcodone (TUSSIONEX) 10-8 MG/5ML LQCR Take 5 mLs by mouth every 12 (twelve) hours as needed for cough. (Patient not taking: Reported on 07/20/2014) 115 mL 0  . dicyclomine (BENTYL) 20 MG tablet Take 1 tablet (20 mg total) by mouth 2 (two) times daily. (Patient not taking: Reported on 10/13/2014) 20 tablet 0  . doxycycline (VIBRAMYCIN) 100 MG capsule Take 1 capsule (100 mg total) by mouth 2 (two) times daily. (Patient not taking: Reported on 10/13/2014) 14 capsule 0  . ferrous sulfate 325 (65 FE) MG tablet Take 1 tablet (325 mg total) by mouth 3 (three) times daily with meals. (Patient not taking: Reported on 10/13/2014) 90 tablet 2  . labetalol (NORMODYNE) 100 MG tablet Take 1 tablet (100 mg total) by mouth 2 (two) times daily. (Patient not taking: Reported on 10/13/2014) 180 tablet 3  . levofloxacin (LEVAQUIN) 750 MG tablet Take 1 tablet (750 mg total) by mouth daily. (Patient not taking: Reported on 06/30/2014) 4 tablet 0  . metoCLOPramide (REGLAN) 10 MG tablet Take 1 tablet (10 mg total) by mouth every 6 (six) hours. (Patient not taking: Reported on 10/13/2014) 120 tablet 2  . metroNIDAZOLE (FLAGYL) 500 MG tablet Take 1 tablet (500 mg total) by mouth every 12 (twelve) hours. (Patient not taking: Reported on 06/30/2014) 9 tablet 0  . NOVOLOG FLEXPEN 100 UNIT/ML FlexPen USE ON sliding scale (Patient not taking: Reported on 10/13/2014) 15 mL 0  .  oxyCODONE-acetaminophen (PERCOCET) 5-325 MG per tablet Take 2 tablets by mouth every 4 (four) hours as needed. (Patient not taking: Reported on 10/13/2014) 10 tablet 0  . promethazine (PHENERGAN) 25 MG tablet Take 1 tablet (25 mg total) by mouth every 6 (six) hours as needed for nausea or vomiting. (Patient not taking: Reported on 10/13/2014) 30 tablet 0    Allergies: Allergies as of 10/13/2014 - Review Complete 10/13/2014  Allergen Reaction Noted  . Lisinopril Nausea Only 03/17/2008  . Morphine and related Itching 04/15/2013   Past Medical History  Diagnosis Date  . Thrombocytosis     Hem/Onc suggested 2/2 chronic hepatits and/or iron deficiency anemia  . Iron deficiency anemia   .  N&V (nausea and vomiting)     Chronic. Unclear etiology with multiple admission and ED visits. CT abdomen with and without contrast (02/2011)  showed no acute process. Gastic Emptying scan (01/2010) was normal. Ultrasound of the abdomen was within normal limits. Hepatitis B viral load was undectable. HIV NR. EGD - gastritis, Hpylori + s/p Rx  . Hypertension   . Hyperlipidemia   . Diabetes mellitus type 2, uncontrolled, with complications   . Recurrent boils   . 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 Cath 12/10- no restenosis or significant CAD progression  . Irregular menses     Small ovarian follicles seen on JK(0/93)  . History of pyelonephritis     H/o GrpB Pyelonephritis (9/06) and UTI- 07/11- E.Coli, 12/10- GBS  . Abscess of tunica vaginalis     10/09- Abundant S. aureus- sensitive to all abx  . Obesity   . Gastritis   . Depression   . Peripheral neuropathy   . Fibromyalgia   . GERD (gastroesophageal reflux disease)   . Anxiety   . Gastroparesis     secondary to poorly controlled DM, last emptying study performed 01/2010  was normal but may be falsely positive as pt was on reglan  .  Blood dyscrasia   . Substance abuse   . MI (myocardial infarction) 2008  . OSA (obstructive sleep apnea)     "suppose to wear mask but I don't" (04/22/2014)  . Seasonal asthma   . Hepatitis B, chronic     Hep BeAb+,Hep B cAb+ & Hep BsAg+ (9/06)  . Migraine     "weekly" (04/22/2014)  . CVA (cerebral vascular accident) ~ 02/2014    denies residual on 04/22/2014  . CVA (cerebral vascular accident)     history of remote right cerebellar infarct noted on head CT at least since 10/2011  . Pneumonia     "this is probably the 2nd or 3rd time I've had pneumonia" (04/22/2014)   Past Surgical History  Procedure Laterality Date  . Cesarean section  1997  . Coronary angioplasty with stent placement  2008    "2 stents"  . Esophagogastroduodenoscopy N/A 04/23/2014    Procedure: ESOPHAGOGASTRODUODENOSCOPY (EGD);  Surgeon: Winfield Cunas., MD;  Location: [facility] ENDOSCOPY;  Service: Endoscopy;  Laterality: N/A;   Family History  Problem Relation Age of Onset  . Diabetes Father    History   Social History  . Marital Status: Single    Spouse Name: N/A  . Number of Children: 2  . Years of Education: 11   Occupational History  . other     unemployed   Social History Main Topics  . Smoking status: Former Smoker    Types: Cigarettes    Quit date: 04/24/1996  . Smokeless tobacco: Never Used     Comment: quit smoking cigarettes age 11  . Alcohol Use: Yes     Comment: 04/22/2014 "might have a few drinks a month"  . Drug Use: No     Comment: 04/22/2104 "quit drugs ~ 1-2 yr ago"  . Sexual Activity: Yes   Other Topics Concern  . 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   Also works at [employer] having to lift elderly individuals.  Review of Systems: Pertinent items are noted in HPI.  Physical Exam: Blood pressure 108/64, pulse 104, temperature 98.5 F (36.9 C), temperature source  Oral, resp. rate 14, height 5\' 7"  (1.702 m), weight 210 lb (95.255 kg), last menstrual period 09/13/2014, SpO2 100 %. Physical Exam  Constitutional: She appears well-developed and well-nourished. She appears distressed.  HENT:  Dry mucous membranes   Cardiovascular: Regular rhythm.  Tachycardia present.   No murmur heard. Pulmonary/Chest: Effort normal. She has wheezes. She exhibits no tenderness.  Abdominal: Soft. Bowel sounds are normal. There is tenderness (LLQ). There is no guarding.  Musculoskeletal: She exhibits no edema.  Skin: Skin is warm and dry.    Lab results: Basic Metabolic Panel:  Recent Labs  10/13/14 0052  NA 134*  K 4.0  CL 98*  CO2 19*  GLUCOSE 362*  BUN 14  CREATININE 1.19*  CALCIUM 10.0   Liver Function Tests:  Recent Labs  10/13/14 0052  AST 21  ALT 9*  ALKPHOS 78  BILITOT 1.0  PROT 9.5*  ALBUMIN 3.9    Recent Labs  10/13/14 0052  LIPASE 19*   CBC:  Recent Labs  10/13/14 0052  WBC 15.8*  NEUTROABS 13.1*  HGB 14.1  HCT 40.3  MCV 71.2*  PLT 496*   Urine Drug Screen: Drugs of Abuse     Component Value Date/Time   LABOPIA NONE DETECTED 07/24/2014 2318   LABOPIA NEGATIVE 02/21/2010 2201   COCAINSCRNUR POSITIVE* 07/24/2014 2318   COCAINSCRNUR NEGATIVE 02/21/2010 2201   LABBENZ NONE DETECTED 07/24/2014 2318   LABBENZ NEGATIVE 02/21/2010 2201   AMPHETMU NONE DETECTED 07/24/2014 2318   AMPHETMU NEGATIVE 02/21/2010 2201   THCU NONE DETECTED 07/24/2014 2318   LABBARB NONE DETECTED 07/24/2014 2318      EKG Interpretation  Date/Time:  Tuesday October 13 2014 00:34:40 EDT Ventricular Rate:  118 PR Interval:  112 QRS Duration: 83 QT Interval:  339 QTC Calculation: 475 R Axis:   59 Text Interpretation:  Sinus tachycardia Multiform ventricular premature complexes Confirmed by DELO  MD, DOUGLAS ([id number]) on 10/13/2014 12:44:12 AM  Assessment & Plan by Problem: Principal Problem:   Increased anion gap metabolic acidosis Active  Problems:   OBESITY, MORBID   Polysubstance abuse   Depression   Essential hypertension   Chronic ischemic heart disease   Asthma   Leukocytosis   Chronic hepatitis B   Intractable vomiting   Diabetic gastroparesis associated with type 2 diabetes mellitus   Tachycardia   Left lower quadrant pain   Hematemesis   DKA (diabetic ketoacidoses)  Hematemesis-- likely 2/2 from n/v x 4 days. Could also be PUD as she takes asa daily, gastritis as biopsy from EGD showed active gastritis, and mallory weiss tear however unlikely as she is not having any pain with vomiting. BP soft at 96/52 and pt is tachycardic up to 130's. Her hemoglobin is stable at 14.1.  - admit to tele - CBC - UDS - NS at 125cc/hr  - protonix 40mg  BID - salicylate level ordered - Reglan 10 mg every 6 hours and phenergan 12.5mg  q6h prn - consult GI in the am  Hyperglycemia in setting of poorly controlled DM2 with neuropathy: HbA1c 11.1 on 06/2014. Glucose on admission 362. She reports her CBGs range from 200-400's at home. She has not been taking her insulin x 2 weeks. She has an anion gap of 17 and bicarb low at 19. Possible that pt is in mild DKA in setting of med noncompliance and  poor po intake and possible URI/COPD exacerbation contributing to n/v. She does have coarse breath sounds and expiratory wheezing on physical exam. She also has subjective fevers at home with a leukocytosis of 15.8 although she has a chronic leukocytosis that is around her b/l.   -Continue gabapentin 300mg  TID -Check hbA1c -BMET q4h - NS at 125cc/hr then D5 1/2NS once CBGs less than 250.  - UA for ketones - checking LA - chest xray  Hypertension: Home medications include labetalol 100 mg twice daily. Has hx of CAD, should consider coreg instead of labetalol.  -hold home labetalol due to soft BPs  Hyperlipidemia: Lipid panel 07/21/2013 LDL 108 -cont home lipitor 80mg   Asthma: Home medications include albuterol inhaler, Advair. -Continue  albuterol nebulizer every 4 hours as needed for wheezing -Start Dulera 2 puffs twice daily  Obstructive sleep apnea: non compliant w/ CPAP -CPAP qhs  Leukocytosis: WBC 15.8, baseline 11-16. Per chart review, she has been evaluated by hematology who felt is related to her chronic hepatitis. She does have a history of hepatitis B infection with HBsAg+ when last checked November 2012 though HCV & HBVeAg both unremarkable.  -Continue following  Thrombocytosis: Platelets 496, baseline 400s to 600s. Thought to be related to iron deficiency. Anemia panel 04/22/2014 with iron less than 10, ferritin 15, TIBC incalculable.  - cont home ferrous sulfate  FEN:  -Diet: Nothing by mouth  DVT prophylaxis: SCDs  CODE STATUS: FULL CODE   Dispo: Disposition is deferred at this time, awaiting improvement of current medical problems.   The patient does have a current PCP Collier Salina, MD) and does need an [facility] hospital follow-up appointment after discharge.  The patient does not have transportation limitations that hinder transportation to clinic appointments.  Signed: Norman Herrlich, MD 10/13/2014, 2:53 AM

## 2014-10-13 NOTE — ED Notes (Signed)
Per EMS pt from home has had nausea and vomiting x 3 days, today emesis was dark red. With EMS pt has vomited appx 340mL of dark red blood. Pt also c/o generalized abdominal pain, non-tender.

## 2014-10-13 NOTE — ED Notes (Signed)
This nurse attempt times 2 without success for IV start

## 2014-10-13 NOTE — ED Notes (Signed)
EMS attempted IV stick x 2 without success and Barb RN attempted IV stick. IV team consult placed.

## 2014-10-13 NOTE — ED Notes (Addendum)
Pt taken to xray 

## 2014-10-13 NOTE — Care Management (Signed)
Spoke with Maudie Mercury, transition RN for [facility] who reviewed record of multiple calls to this pt and attempts to engage pt. Pt has repeatedly declined services or hung up on CM caller.  Will speak with pt to encourage pt to accept [facility] services.  CRoyal RN MPH, case manager, [phone]

## 2014-10-13 NOTE — Progress Notes (Signed)
Subjective: No emesis overnight. Denies nausea, is tolerating a liquid diet well. Reports that her chronic abdominal pain is getting worse. She takes Percocet at home for her pain. She feels anxious and reports that she takes at least one tablet of Ativan per day.   Objective: Vital signs in last 24 hours: Filed Vitals:   10/13/14 0415 10/13/14 0455 10/13/14 0523 10/13/14 1100  BP: 171/112 169/100 106/65 108/70  Pulse: 139 132 124 98  Temp:   98.6 F (37 C) 98.2 F (36.8 C)  TempSrc:   Other (Comment) Oral  Resp: 23 17 18 18   Height:      Weight:   201 lb (91.173 kg)   SpO2: 100% 100% 97% 98%   Weight change:   Intake/Output Summary (Last 24 hours) at 10/13/14 1446 Last data filed at 10/13/14 1444  Gross per 24 hour  Intake   1075 ml  Output    850 ml  Net    225 ml   Vitals reviewed. General: resting in bed, in mild distress due to anxiety, rocking back and forth HEENT:  no scleral icterus Cardiac: regular rate Pulm: no respiratory distress Abd: nondistended, LLQ TTP Ext:no edema Neuro: alert and oriented X3, moves all extremities voluntarily  Lab Results: Basic Metabolic Panel:  Recent Labs Lab 10/13/14 0520 10/13/14 1010  NA 137 140  K 3.7 3.9  CL 105 111  CO2 20* 22  GLUCOSE 285* 99  BUN 17 19  CREATININE 1.19* 1.49*  CALCIUM 8.5* 8.5*   Liver Function Tests:  Recent Labs Lab 10/13/14 0052  AST 21  ALT 9*  ALKPHOS 78  BILITOT 1.0  PROT 9.5*  ALBUMIN 3.9    Recent Labs Lab 10/13/14 0052  LIPASE 19*   CBC:  Recent Labs Lab 10/13/14 0052 10/13/14 0520  WBC 15.8* 14.7*  NEUTROABS 13.1*  --   HGB 14.1 11.0*  HCT 40.3 32.5*  MCV 71.2* 72.2*  PLT 496* 431*   Cardiac Enzymes:  Recent Labs Lab 10/13/14 0521  TROPONINI <0.03   CBG:  Recent Labs Lab 10/13/14 0656 10/13/14 0811 10/13/14 1010 10/13/14 1114 10/13/14 1308 10/13/14 1441  GLUCAP 173* 133* 94 92 276* 157*   Urine Drug Screen: Drugs of Abuse     Component  Value Date/Time   LABOPIA NONE DETECTED 07/24/2014 2318   LABOPIA NEGATIVE 02/21/2010 2201   COCAINSCRNUR POSITIVE* 07/24/2014 2318   COCAINSCRNUR NEGATIVE 02/21/2010 2201   LABBENZ NONE DETECTED 07/24/2014 2318   LABBENZ NEGATIVE 02/21/2010 2201   AMPHETMU NONE DETECTED 07/24/2014 2318   AMPHETMU NEGATIVE 02/21/2010 2201   THCU NONE DETECTED 07/24/2014 2318   LABBARB NONE DETECTED 07/24/2014 2318     Micro Results: No results found for this or any previous visit (from the past 240 hour(s)). Studies/Results: Dg Chest 2 View  10/13/2014   CLINICAL DATA:  Acute onset of cough, shortness of breath and wheezing. Leukocytosis. Initial encounter.  EXAM: CHEST  2 VIEW  COMPARISON:  Chest radiograph performed 07/25/2014  FINDINGS: The lungs are well-aerated and clear. There is no evidence of focal opacification, pleural effusion or pneumothorax.  The heart is normal in size; the mediastinal contour is within normal limits. No acute osseous abnormalities are seen.  IMPRESSION: No acute cardiopulmonary process seen.   Electronically Signed   By: Garald Balding M.D.   On: 10/13/2014 04:51   Medications: I have reviewed the patient's current medications. Scheduled Meds: . insulin aspart  0-24 Units Subcutaneous 6 times per  day  . insulin glargine  18 Units Subcutaneous Once  . ipratropium-albuterol  3 mL Nebulization Q8H  . mometasone-formoterol  2 puff Inhalation BID  . pantoprazole (PROTONIX) IV  40 mg Intravenous Q12H  . sodium chloride  3 mL Intravenous Q12H   Continuous Infusions: . dextrose 5 % and 0.45 % NaCl with KCl 20 mEq/L 100 mL/hr at 10/13/14 1115  . insulin (NOVOLIN-R) infusion     PRN Meds:.acetaminophen **OR** acetaminophen, LORazepam, oxyCODONE-acetaminophen Assessment/Plan: 39 yr old woman with PMH of chronic abdominal pain, DM2 with neuropathy and gastroparesis, HTN, HLP, CAD s/p angioplasty, obesity, hx of CVA, OSA, polysubstance abuse presenting with hematemesis and DKA  with AG of 17.   Hematemesis: Resolved. Likely due to gastritis 2/2 vomiting x 4 days and gastroparesis. She has had extensive GI work up in the past for recurrent N/V that revealed H.pylori infection in 2011 and underwent treatment for this. Most recent EGD in Dec 2015 with no gastritis but residual food c/w gastroparesis. Had normal gastric emptying study in 2011 but this was done while she was on Reglan.  -Continue Reglan for now -Will likely need repeat gastric emptying study while off Reglan and referral GI -Advance diet as tolerated -Resume home phenergan for nausea -Continue Protonix BID  DKA: In setting of insulin noncompliance x 2 weeks. Last BMETs with nl AG.  -Discontinue insulin drip -Lantus 18 u now (on 40 units at home) -SSI sensitive  HTN: Normotensive here.  -Hold labetalol, consider stopping this and transitioning to carvedilol in setting of UDS+ for cocaine  HLD: on Lipitor at home which is currently on hold.   Asthma: Stable.  -Continue Dulera (on Advair at home)  Leucocytosis: Chronic, WBC 15.8 on presentation but baseline 11-16. No s/s of infectious process seen so far.  -trend CBC, continue monitoring  Thrombocytosis: Plts at 496 on presentation, likely 2/2 her iron deficiency anemia.   Chronic abdominal pain: She takes Percocet at home with confirmation per her Pharmacy.  -Ordered Percocet 5-325mg  1 tablet q8h PRN for pain  Anxiety: She takes at least one tablet of ativan 1mg  daily for anxiety. Had s/s concerning for benzo withdrawal this afternoon. Called her Pharmacy and confirmed that she is on this medication at home.  -Ordered Ativan 1mg  PRN for anxiety  DVT prophylaxis: SCDs  FEN:  NSL Replete as needed Advance diet as tolerated   Dispo: Disposition is deferred at this time, awaiting improvement of current medical problems.  Anticipated discharge in approximately 1-2 day(s).   The patient does have a current PCP Collier Salina, MD) and does  not need an [facility] hospital follow-up appointment after discharge.  The patient does not have transportation limitations that hinder transportation to clinic appointments.  .Services Needed at time of discharge: Y = Yes, Blank = No PT:   OT:   RN:   Equipment:   Other:     LOS: 0 days   Blain Pais, MD  IMTS, PGY3 10/13/2014, 2:46 PM

## 2014-10-13 NOTE — ED Notes (Signed)
Patient transported to X-ray 

## 2014-10-13 NOTE — Progress Notes (Signed)
Subjective: Is feeling better this morning. No episodes of emesis overnight.    Objective: Vital signs in last 24 hours: Filed Vitals:   10/13/14 0400 10/13/14 0415 10/13/14 0455 10/13/14 0523  BP: 169/110 171/112 169/100 106/65  Pulse: 130 139 132 124  Temp:    98.6 F (37 C)  TempSrc:    Other (Comment)  Resp: 34 23 17 18   Height:      Weight:    91.173 kg (201 lb)  SpO2: 97% 100% 100% 97%   General: somnolent but easily arousable, lying in bed, in NAD.  CV: RRR, no murmurs, rubs, or gallops Lungs: CTAB, normal respiratory effort Abdomen: quiet BS, soft,  Mild tenderness to palpation in LLQ, no guarding  Extremities: we perfused, warm, no edema   Lab Results: Basic Metabolic Panel:  Recent Labs  10/13/14 0052 10/13/14 0520  NA 134* 137  K 4.0 3.7  CL 98* 105  CO2 19* 20*  GLUCOSE 362* 285*  BUN 14 17  CREATININE 1.19* 1.19*  CALCIUM 10.0 8.5*   Liver Function Tests:  Recent Labs  10/13/14 0052  AST 21  ALT 9*  ALKPHOS 78  BILITOT 1.0  PROT 9.5*  ALBUMIN 3.9    Recent Labs  10/13/14 0052  LIPASE 19*   CBC:  Recent Labs  10/13/14 0052 10/13/14 0520  WBC 15.8* 14.7*  NEUTROABS 13.1*  --   HGB 14.1 11.0*  HCT 40.3 32.5*  MCV 71.2* 72.2*  PLT 496* 431*   Cardiac Enzymes:  Recent Labs  10/13/14 0521  TROPONINI <0.03   CBG:  Recent Labs  10/13/14 0319 10/13/14 0447 10/13/14 0554 10/13/14 0656 10/13/14 0811  GLUCAP 309* 278* 219* 173* 133*   Urine Drug Screen: Drugs of Abuse     Component Value Date/Time   LABOPIA NONE DETECTED 07/24/2014 2318   LABOPIA NEGATIVE 02/21/2010 2201   COCAINSCRNUR POSITIVE* 07/24/2014 2318   COCAINSCRNUR NEGATIVE 02/21/2010 2201   LABBENZ NONE DETECTED 07/24/2014 2318   LABBENZ NEGATIVE 02/21/2010 2201   AMPHETMU NONE DETECTED 07/24/2014 2318   AMPHETMU NEGATIVE 02/21/2010 2201   THCU NONE DETECTED 07/24/2014 2318   LABBARB NONE DETECTED 07/24/2014 2318     Studies/Results: Dg Chest 2  View  10/13/2014   CLINICAL DATA:  Acute onset of cough, shortness of breath and wheezing. Leukocytosis. Initial encounter.  EXAM: CHEST  2 VIEW  COMPARISON:  Chest radiograph performed 07/25/2014  FINDINGS: The lungs are well-aerated and clear. There is no evidence of focal opacification, pleural effusion or pneumothorax.  The heart is normal in size; the mediastinal contour is within normal limits. No acute osseous abnormalities are seen.  IMPRESSION: No acute cardiopulmonary process seen.   Electronically Signed   By: Garald Balding M.D.   On: 10/13/2014 04:51   Medications: I have reviewed the patient's current medications. Scheduled Meds: . ipratropium-albuterol  3 mL Nebulization Q8H  . mometasone-formoterol  2 puff Inhalation BID  . pantoprazole (PROTONIX) IV  40 mg Intravenous Q12H  . sodium chloride  3 mL Intravenous Q12H   Continuous Infusions: . 0.9 % NaCl with KCl 20 mEq / L    . dextrose 5 % and 0.45% NaCl 125 mL/hr at 10/13/14 0557  . insulin (NOVOLIN-R) infusion 3.4 Units/hr (10/13/14 0659)   PRN Meds:.acetaminophen **OR** acetaminophen, promethazine Assessment/Plan: Principal Problem:   Increased anion gap metabolic acidosis Active Problems:   OBESITY, MORBID   Polysubstance abuse   Depression   Essential hypertension   Chronic ischemic  heart disease   Asthma   Leukocytosis   Chronic hepatitis B   Intractable vomiting   Diabetic gastroparesis associated with type 2 diabetes mellitus   Tachycardia   Left lower quadrant pain   Hematemesis   DKA (diabetic ketoacidoses)  Ms. Timmons is a 39 yo F with a h/o chronic abdominal pain, poorly controlled T2DM with neuropathy and gastroparesis, HTN, HLD, CAD s/p angioplasty, obesity, h/o CVA, OSA, polysubstance abuse who presented with hematemesis and was found to be in DKA with AG of 17.   # Hematemesis - patient presents with hematemesis in the setting of 4 days of N/V. Per ED note, EMS reported that the patient vomited  about 300 ml dark red blood. Her hematemesis since resolved. The patient has a h/o chronic n/v of unclear etiology with multiple ED visits. She had multiple CTs over the past several years which were negative for any acute process. She had a gastric emptying scan on 01/2010, which was normal, however, according to previous note, the patient was on Reglan at the time. She underwent an EGD in 2011, which showed mild gastritis with +H Pylori, which was treated at that time. Her most recent EGD in 03/2014, was negative for gastritis, but showed food residue in the gastric body c/w gastroparesis. The current episode of hematemesis is most likely due to [employer] tear in setting of prolonged vomiting. However, it could also be cause by PUD given h/o + H Pylori or esophageal ulcer, given h/o GERD. Boerhaave is also a possibility, but less likely since bleeding was not massive and was self limiting. Pt with stable vital signs. Her Hgb was 14.1 at admission and it came down to 11 this am. There is probably a significant dilutional effect given large amount of fluid resuscitation she received. Pt is not symptomatic.  - admit to tele - trend Hgb repeat CBC tomorrow  - IVF as per below - Protonix 40 mg q12 BID - start clear liquid diet, advance as tolerated -  phenergan 12.5 mg q6h prn for nausea  #DKA On admission, patient with glucose of 362, AG 17, bicarb 19. LA 1.6. She reports her CBG at home range from 200-400. She has not been taking her insulin for the past 2 weeks. Last HgbA1C on 06/2014 was 11.1  - D5 1/2 NS with 20 mEq @100  - AG closed x 2 - Patient is on clear diet, will advance as tolerated - Will continue insulin drip until patient can take PO   #HTN On labetalol 100 mg BID. Consider - holding home labetalol  #HLD LDL 108 on 07/22/2014 - restart home Lipitor once taking good po  #Asthma On Advair and albuterol at home  - cnt albuterol q4hrs for wheezing prn  - Dulera 2 puffs  BID  #Leukocytosis WBC 15.8 on admission, baseline 11-16. Per chart review, she has been evaluated by hematology who felt is related to her chronic hepatitis. Could also be due to PCOS, since pt with h/o irregular periods, obesity.  #Thrombocytopenia Plts at 496 on admission. Possibly 2/2/ iron defficiency anemia. Anemia panel 04/22/2014 with iron less than 10, ferritin 15, TIBC incalculable  FENGI:  - Clears, advance to soft if tolerating - -D5 1/2 NS with 20 mEq @100  - replete electrolytes as needed   DVT ppx:  -SCDs   This is a Careers information officer Note.  The care of the patient was discussed with Dr. Hayes Ludwig and the assessment and plan formulated with their assistance.  Please  see their attached note for official documentation of the daily encounter.   LOS: 0 days   [employer], Med Student 10/13/2014, 10:03 AM

## 2014-10-13 NOTE — ED Provider Notes (Signed)
CSN: [id number]     Arrival date & time 10/13/14  0024 History  This chart was scribed for Veryl Speak, MD by Rayfield Citizen, ED Scribe. This patient was seen in room D31C/D31C and the patient's care was started at 12:46 AM.    Chief Complaint  Patient presents with  . Hematemesis   The history is provided by the patient and the EMS personnel. No language interpreter was used.     HPI Comments: Shirley [name] is a 39 y.o. female with past medical history of HTN, HLD,chronic Hep B, DM2, CVA, MI, CAD, pyelonephritis, gastritis, GERD, gastroparesis, chronic nausea and vomitingwho presents to the Emergency Department complaining of 4 days of nausea and vomiting. Patient also notes intermittent SOB and wheezing with vomiting; she utilized her home inhaler without relief. She describes episodes of hematemesis beginning on 6/19. Per EMS, patient vomited approximately 371ml of dark red blood. Patient now complains of upper abdominal pain, exacerbated with movement and certain positions.   Patient has been on insulin since age 1-15. Last CBG noted on Thursday, 6/16; 147. Patient still has her gall bladder.   Past Medical History  Diagnosis Date  . Thrombocytosis     Hem/Onc suggested 2/2 chronic hepatits and/or iron deficiency anemia  . Iron deficiency anemia   . N&V (nausea and vomiting)     Chronic. Unclear etiology with multiple admission and ED visits. CT abdomen with and without contrast (02/2011)  showed no acute process. Gastic Emptying scan (01/2010) was normal. Ultrasound of the abdomen was within normal limits. Hepatitis B viral load was undectable. HIV NR. EGD - gastritis, Hpylori + s/p Rx  . Hypertension   . Hyperlipidemia   . Diabetes mellitus type 2, uncontrolled, with complications   . Recurrent boils   . 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 Cath 12/10- no  restenosis or significant CAD progression  . Irregular menses     Small ovarian follicles seen on TM(1/96)  . History of pyelonephritis     H/o GrpB Pyelonephritis (9/06) and UTI- 07/11- E.Coli, 12/10- GBS  . Abscess of tunica vaginalis     10/09- Abundant S. aureus- sensitive to all abx  . Obesity   . Gastritis   . Depression   . Peripheral neuropathy   . Fibromyalgia   . GERD (gastroesophageal reflux disease)   . Anxiety   . Gastroparesis     secondary to poorly controlled DM, last emptying study performed 01/2010  was normal but may be falsely positive as pt was on reglan  . Blood dyscrasia   . Substance abuse   . MI (myocardial infarction) 2008  . OSA (obstructive sleep apnea)     "suppose to wear mask but I don't" (04/22/2014)  . Seasonal asthma   . Hepatitis B, chronic     Hep BeAb+,Hep B cAb+ & Hep BsAg+ (9/06)  . Migraine     "weekly" (04/22/2014)  . CVA (cerebral vascular accident) ~ 02/2014    denies residual on 04/22/2014  . CVA (cerebral vascular accident)     history of remote right cerebellar infarct noted on head CT at least since 10/2011  . Pneumonia     "this is probably the 2nd or 3rd time I've had pneumonia" (04/22/2014)   Past Surgical History  Procedure Laterality Date  . Cesarean section  1997  . Coronary angioplasty with stent placement  2008    "2 stents"  . Esophagogastroduodenoscopy N/A 04/23/2014    Procedure: ESOPHAGOGASTRODUODENOSCOPY (EGD);  Surgeon: Winfield Cunas., MD;  Location[facility] ENDOSCOPY;  Service: Endoscopy;  Laterality: N/A;   Family History  Problem Relation Age of Onset  . Diabetes Father    History  Substance Use Topics  . Smoking status: Former Smoker    Types: Cigarettes    Quit date: 04/24/1996  . Smokeless tobacco: Never Used     Comment: quit smoking cigarettes age 20  . Alcohol Use: Yes     Comment: 04/22/2014 "might have a few drinks a month"   OB History    No data available     Review of Systems  A  complete 10 system review of systems was obtained and all systems are negative except as noted in the HPI and PMH.    Allergies  Lisinopril and Morphine and related  Home Medications   Prior to Admission medications   Medication Sig Start Date End Date Taking? Authorizing Provider  ADVAIR DISKUS 100-50 MCG/DOSE AEPB Inhale 1 puff into the lungs 2 (two) times daily. 09/17/14   Luan Moore, MD  ADVAIR DISKUS 100-50 MCG/DOSE AEPB Inhale 1 puff into the lungs 2 (two) times daily. 09/22/14   Jessee Avers, MD  albuterol (PROVENTIL HFA;VENTOLIN HFA) 108 (90 BASE) MCG/ACT inhaler Inhale 2 puffs into the lungs every 4 (four) hours as needed for wheezing or shortness of breath. 04/26/14   Alexa Sherral Hammers, MD  atorvastatin (LIPITOR) 80 MG tablet Take 1 tablet (80 mg total) by mouth daily at 6 PM. 07/25/13   Dixon Boos, MD  BD PEN NEEDLE NANO U/F 32G X 4 MM MISC USE AS DIRECTED 07/31/14   Luan Moore, MD  chlorpheniramine-HYDROcodone (TUSSIONEX) 10-8 MG/5ML LQCR Take 5 mLs by mouth every 12 (twelve) hours as needed for cough. Patient not taking: Reported on 07/20/2014 05/25/14   Luan Moore, MD  dicyclomine (BENTYL) 20 MG tablet Take 1 tablet (20 mg total) by mouth 2 (two) times daily. 12/31/13   [name], MD  doxycycline (VIBRAMYCIN) 100 MG capsule Take 1 capsule (100 mg total) by mouth 2 (two) times daily. 09/21/14   Al Corpus, PA-C  ferrous sulfate 325 (65 FE) MG tablet Take 1 tablet (325 mg total) by mouth 3 (three) times daily with meals. 08/17/14   Luan Moore, MD  gabapentin (NEURONTIN) 300 MG capsule Take 300 mg by mouth 3 (three) times daily.     Historical Provider, MD  insulin aspart (NOVOLOG) 100 UNIT/ML injection Sliding scale- If sugar 150-199: inject 2 unit;  200-249: 4 units, 250-299: 7 units; 300-349: 10 units; Over 350: 12 units PENS PLEASE 05/16/14   Luan Moore, MD  labetalol (NORMODYNE) 100 MG tablet Take 1 tablet (100 mg total) by mouth 2 (two) times daily. 03/05/14    Oval Linsey, MD  LANTUS SOLOSTAR 100 UNIT/ML Solostar Pen Inject 50 Units into the skin daily at 10 pm. 09/16/14   Luan Moore, MD  levofloxacin (LEVAQUIN) 750 MG tablet Take 1 tablet (750 mg total) by mouth daily. Patient not taking: Reported on 06/30/2014 04/26/14   Alexa Sherral Hammers, MD  LORazepam (ATIVAN) 1 MG tablet Take 1 mg by mouth every 8 (eight) hours as needed for anxiety.    Historical Provider, MD  metoCLOPramide (REGLAN) 10 MG tablet Take 1 tablet (10 mg total) by mouth every 6 (six) hours. 08/20/14   Luan Moore, MD  metroNIDAZOLE (FLAGYL) 500 MG tablet Take  1 tablet (500 mg total) by mouth every 12 (twelve) hours. Patient not taking: Reported on 06/30/2014 04/26/14   Alexa Sherral Hammers, MD  naproxen sodium (ANAPROX) 220 MG tablet Take 440 mg by mouth 2 (two) times daily as needed (pain).    Historical Provider, MD  NOVOLOG FLEXPEN 100 UNIT/ML FlexPen USE ON sliding scale 09/16/14   Luan Moore, MD  omeprazole (PRILOSEC) 40 MG capsule Take 1 capsule (40 mg total) by mouth daily. 08/20/14   Luan Moore, MD  oxyCODONE-acetaminophen (PERCOCET) 5-325 MG per tablet Take 2 tablets by mouth every 4 (four) hours as needed. 07/15/14   Ernestina Patches, MD  promethazine (PHENERGAN) 25 MG tablet Take 1 tablet (25 mg total) by mouth every 6 (six) hours as needed for nausea or vomiting. 07/25/14   [name], MD  senna-docusate (SENOKOT-S) 8.6-50 MG per tablet Take 1 tablet by mouth daily.    Historical Provider, MD   BP 141/94 mmHg  Pulse 122  Temp(Src) 98.5 F (36.9 C) (Oral)  Resp 20  Ht 5\' 7"  (1.702 m)  Wt 210 lb (95.255 kg)  BMI 32.88 kg/m2  SpO2 100%  LMP 09/13/2014 Physical Exam  Constitutional: She is oriented to person, place, and time. She appears well-developed and well-nourished.  HENT:  Head: Normocephalic and atraumatic.  Mucous membranes somewhat dry  Neck: No tracheal deviation present.  Cardiovascular: Regular rhythm and normal heart sounds.  Tachycardia present.  Exam  reveals no gallop.   No murmur heard. Pulmonary/Chest: Effort normal and breath sounds normal. No respiratory distress. She has no wheezes. She has no rales.  Abdominal: There is tenderness. There is no rebound and no guarding.  Mild generalized tenderness to palpation  Musculoskeletal: Normal range of motion. She exhibits no edema.  Neurological: She is alert and oriented to person, place, and time.  Skin: Skin is warm and dry.  Psychiatric: She has a normal mood and affect. Her behavior is normal.  Nursing note and vitals reviewed.   ED Course  Procedures   DIAGNOSTIC STUDIES: Oxygen Saturation is 100% on Ra, normal by my interpretation.    COORDINATION OF CARE: 12:52 AM Discussed treatment plan with pt at bedside and pt agreed to plan.   Labs Review Labs Reviewed - No data to display  Imaging Review No results found.   EKG Interpretation   Date/Time:  Tuesday October 13 2014 00:34:40 EDT Ventricular Rate:  118 PR Interval:  112 QRS Duration: 83 QT Interval:  339 QTC Calculation: 475 R Axis:   59 Text Interpretation:  Sinus tachycardia Multiform ventricular premature  complexes Confirmed by Marlita Keil  MD, Gianah Batt ([id number]) on 10/13/2014 12:44:12 AM      MDM   Final diagnoses:  None    Patient presents with nausea, vomiting, diarrhea for the past several days. Today she began throwing up blood. Her workup reveals an elevated white count of 16,000, glucose of 362, and anion gap of 17. It appears though she is trending towards diabetic ketoacidosis. Although her hemoglobin is stable, she does continue to have bloody emesis in the emergency department that is uncontrolled with Reglan and other anti-emetics. I've spoken with the internal medicine teaching service who will admit the patient for further workup and treatment. She was started on the glucose stabilizer.  CRITICAL CARE Performed by: Veryl Speak Total critical care time: 45 minutes Critical care time was exclusive  of separately billable procedures and treating other patients. Critical care was necessary to treat or prevent imminent or life-threatening  deterioration. Critical care was time spent personally by me on the following activities: development of treatment plan with patient and/or surrogate as well as nursing, discussions with consultants, evaluation of patient's response to treatment, examination of patient, obtaining history from patient or surrogate, ordering and performing treatments and interventions, ordering and review of laboratory studies, ordering and review of radiographic studies, pulse oximetry and re-evaluation of patient's condition.   I personally performed the services described in this documentation, which was scribed in my presence. The recorded information has been reviewed and is accurate.       Veryl Speak, MD 10/13/14 [phone]

## 2014-10-14 LAB — DIFFERENTIAL
Basophils Absolute: 0 10*3/uL (ref 0.0–0.1)
Basophils Relative: 0 % (ref 0–1)
Eosinophils Absolute: 0.3 10*3/uL (ref 0.0–0.7)
Eosinophils Relative: 3 % (ref 0–5)
LYMPHS ABS: 2.5 10*3/uL (ref 0.7–4.0)
LYMPHS PCT: 27 % (ref 12–46)
MONO ABS: 0.8 10*3/uL (ref 0.1–1.0)
Monocytes Relative: 9 % (ref 3–12)
NEUTROS ABS: 5.6 10*3/uL (ref 1.7–7.7)
Neutrophils Relative %: 61 % (ref 43–77)

## 2014-10-14 LAB — CBC
HCT: 28.4 % — ABNORMAL LOW (ref 36.0–46.0)
Hemoglobin: 9.7 g/dL — ABNORMAL LOW (ref 12.0–15.0)
MCH: 24.6 pg — AB (ref 26.0–34.0)
MCHC: 34.2 g/dL (ref 30.0–36.0)
MCV: 71.9 fL — AB (ref 78.0–100.0)
PLATELETS: 399 10*3/uL (ref 150–400)
RBC: 3.95 MIL/uL (ref 3.87–5.11)
RDW: 15.6 % — AB (ref 11.5–15.5)
WBC: 9.1 10*3/uL (ref 4.0–10.5)

## 2014-10-14 LAB — BASIC METABOLIC PANEL
Anion gap: 6 (ref 5–15)
BUN: 10 mg/dL (ref 6–20)
CO2: 23 mmol/L (ref 22–32)
Calcium: 8.4 mg/dL — ABNORMAL LOW (ref 8.9–10.3)
Chloride: 107 mmol/L (ref 101–111)
Creatinine, Ser: 1.01 mg/dL — ABNORMAL HIGH (ref 0.44–1.00)
GFR calc Af Amer: >60 mL/min (ref >60)
GFR calc non Af Amer: >60 mL/min (ref >60)
GLUCOSE: 256 mg/dL — AB (ref 65–99)
POTASSIUM: 4.1 mmol/L (ref 3.5–5.1)
Sodium: 136 mmol/L (ref 135–145)

## 2014-10-14 LAB — GLUCOSE, CAPILLARY
GLUCOSE-CAPILLARY: 188 mg/dL — AB (ref 65–99)
GLUCOSE-CAPILLARY: 231 mg/dL — AB (ref 65–99)
GLUCOSE-CAPILLARY: 263 mg/dL — AB (ref 65–99)
Glucose-Capillary: 225 mg/dL — ABNORMAL HIGH (ref 65–99)
Glucose-Capillary: 306 mg/dL — ABNORMAL HIGH (ref 65–99)

## 2014-10-14 LAB — SEDIMENTATION RATE: Sed Rate: 51 mm/hr — ABNORMAL HIGH (ref 0–22)

## 2014-10-14 LAB — HEMOGLOBIN A1C
Hgb A1c MFr Bld: 12.9 % — ABNORMAL HIGH (ref 4.8–5.6)
Mean Plasma Glucose: 324 mg/dL

## 2014-10-14 MED ORDER — KETOROLAC TROMETHAMINE 15 MG/ML IJ SOLN
15.0000 mg | Freq: Once | INTRAMUSCULAR | Status: DC
  Filled 2014-10-14: qty 1

## 2014-10-14 MED ORDER — INSULIN ASPART 100 UNIT/ML ~~LOC~~ SOLN
0.0000 [IU] | Freq: Three times a day (TID) | SUBCUTANEOUS | Status: DC
  Administered 2014-10-14: 7 [IU] via SUBCUTANEOUS
  Administered 2014-10-14: 15 [IU] via SUBCUTANEOUS
  Administered 2014-10-15: 4 [IU] via SUBCUTANEOUS
  Administered 2014-10-15: 11 [IU] via SUBCUTANEOUS
  Administered 2014-10-15: 15 [IU] via SUBCUTANEOUS

## 2014-10-14 MED ORDER — INSULIN GLARGINE 100 UNIT/ML ~~LOC~~ SOLN
25.0000 [IU] | Freq: Every day | SUBCUTANEOUS | Status: DC
  Administered 2014-10-14: 25 [IU] via SUBCUTANEOUS
  Filled 2014-10-14 (×2): qty 0.25

## 2014-10-14 MED ORDER — SODIUM CHLORIDE 0.9 % IV SOLN
510.0000 mg | Freq: Once | INTRAVENOUS | Status: AC
  Administered 2014-10-14: 510 mg via INTRAVENOUS
  Filled 2014-10-14: qty 17

## 2014-10-14 MED ORDER — KETOROLAC TROMETHAMINE 15 MG/ML IJ SOLN
15.0000 mg | Freq: Once | INTRAMUSCULAR | Status: AC
  Administered 2014-10-14: 15 mg via INTRAVENOUS
  Filled 2014-10-14: qty 1

## 2014-10-14 NOTE — Progress Notes (Signed)
Subjective:   Day of hospitalization: 1  VSS.  No overnight events.  Spoke with RN and stated pt had further episodes of emesis yesterday and was able to tolerate a soft diet.  When we saw her this AM she was eating breakfast consuming a sausage biscuit and grits.  She did not appear in any distress but was c/o nausea which is chronic for her.    I spoke with her about receiving [facility] services but apparently she has refused this in the past. She refused to walk this AM or get out of bed.     Objective:   Vital signs in last 24 hours: Filed Vitals:   10/13/14 2125 10/13/14 2127 10/14/14 0501 10/14/14 0954  BP: 122/72  115/64   Pulse: 113 100 103   Temp: 98.9 F (37.2 C)  98.4 F (36.9 C)   TempSrc: Oral  Oral   Resp: 14 18 18    Height:      Weight: 214 lb 11.2 oz (97.387 kg)     SpO2: 99%  100% 98%    Weight: Filed Weights   10/13/14 0033 10/13/14 0523 10/13/14 2125  Weight: 210 lb (95.255 kg) 201 lb (91.173 kg) 214 lb 11.2 oz (97.387 kg)    I/Os:  Intake/Output Summary (Last 24 hours) at 10/14/14 1055 Last data filed at 10/14/14 0600  Gross per 24 hour  Intake 2881.33 ml  Output    850 ml  Net 2031.33 ml    Physical Exam: Constitutional: Vital signs reviewed.  Patient is sitting up in bed in no acute distress and cooperative with exam.   HEENT: [place]/AT; EOMI, conjunctivae normal, no scleral icterus  Cardiovascular: RRR, no MRG Pulmonary/Chest: normal respiratory effort, no accessory muscle use, CTAB, no wheezes, rales, or rhonchi Abdominal: Obese.  Soft. +BS, NT/ND Neurological: A&O x3, CN II-XII grossly intact; non-focal exam Extremities: 2+DP b/l, no LE edema Skin: Warm, dry and intact. No rash Psych: Very flat affect, requesting the shades be shut.    Lab Results:  BMP:  Recent Labs Lab 10/13/14 1010 10/14/14 0833  NA 140 136  K 3.9 4.1  CL 111 107  CO2 22 23  GLUCOSE 99 256*  BUN 19 10  CREATININE 1.49* 1.01*  CALCIUM 8.5* 8.4*     CBC:  Recent Labs Lab 10/13/14 0052 10/13/14 0520 10/14/14 0602  WBC 15.8* 14.7* 9.1  NEUTROABS 13.1*  --  5.6  HGB 14.1 11.0* 9.7*  HCT 40.3 32.5* 28.4*  MCV 71.2* 72.2* 71.9*  PLT 496* 431* 399    Coagulation: No results for input(s): LABPROT, INR in the last 168 hours.  CBG:            Recent Labs Lab 10/13/14 1544 10/13/14 1644 10/13/14 1825 10/13/14 2133 10/14/14 0258 10/14/14 0755  GLUCAP 141* 152* 144* 134* 188* 225*           HA1C:       Recent Labs Lab 10/13/14 1010  HGBA1C 12.9*    Lipid Panel: No results for input(s): CHOL, HDL, LDLCALC, TRIG, CHOLHDL, LDLDIRECT in the last 168 hours.  LFTs:  Recent Labs Lab 10/13/14 0052  AST 21  ALT 9*  ALKPHOS 78  BILITOT 1.0  PROT 9.5*  ALBUMIN 3.9    Pancreatic Enzymes:  Recent Labs Lab 10/13/14 0052  LIPASE 19*    Lactic Acid/Procalcitonin:  Recent Labs Lab 10/13/14 0541  LATICACIDVEN 1.6    Ammonia: No results for input(s): AMMONIA in the last  168 hours.  Cardiac Enzymes:  Recent Labs Lab 10/13/14 0521  TROPONINI <0.03    EKG: EKG Interpretation  Date/Time:  Tuesday October 13 2014 00:34:40 EDT Ventricular Rate:  118 PR Interval:  112 QRS Duration: 83 QT Interval:  339 QTC Calculation: 475 R Axis:   59 Text Interpretation:  Sinus tachycardia Multiform ventricular premature complexes Confirmed by DELO  MD, DOUGLAS ([id number]) on 10/13/2014 12:44:12 AM   BNP: No results for input(s): PROBNP in the last 168 hours.  D-Dimer: No results for input(s): DDIMER in the last 168 hours.  Urinalysis:  Recent Labs Lab 10/13/14 1454  COLORURINE YELLOW  LABSPEC 1.033*  PHURINE 5.5  GLUCOSEU >1000*  HGBUR NEGATIVE  BILIRUBINUR SMALL*  KETONESUR 40*  PROTEINUR 30*  UROBILINOGEN 0.2  NITRITE NEGATIVE  LEUKOCYTESUR NEGATIVE    Micro Results: No results found for this or any previous visit (from the past 240 hour(s)).  Blood Culture:    Component Value Date/Time    SDES URINE, RANDOM 04/22/2014 0037   SPECREQUEST NONE 04/22/2014 0037   CULT  04/22/2014 0008    NO GROWTH 5 DAYS Performed at [facility] 04/26/2014 FINAL 04/22/2014 0037    Studies/Results: Dg Chest 2 View  10/13/2014   CLINICAL DATA:  Acute onset of cough, shortness of breath and wheezing. Leukocytosis. Initial encounter.  EXAM: CHEST  2 VIEW  COMPARISON:  Chest radiograph performed 07/25/2014  FINDINGS: The lungs are well-aerated and clear. There is no evidence of focal opacification, pleural effusion or pneumothorax.  The heart is normal in size; the mediastinal contour is within normal limits. No acute osseous abnormalities are seen.  IMPRESSION: No acute cardiopulmonary process seen.   Electronically Signed   By: Garald Balding M.D.   On: 10/13/2014 04:51    Medications:  Scheduled Meds: . ferumoxytol  510 mg Intravenous Once  . insulin aspart  0-20 Units Subcutaneous TID WC  . mometasone-formoterol  2 puff Inhalation BID  . pantoprazole (PROTONIX) IV  40 mg Intravenous Q12H  . sodium chloride  3 mL Intravenous Q12H   Continuous Infusions:  PRN Meds: acetaminophen **OR** acetaminophen, benzonatate, ipratropium-albuterol, LORazepam  Antibiotics: Antibiotics Given (last 72 hours)    None      Day of Hospitalization: 1  Consults:    Assessment/Plan:   Principal Problem:   DKA (diabetic ketoacidoses) Active Problems:   Polysubstance abuse   Depression   Essential hypertension   Chronic ischemic heart disease   Asthma   Iron deficiency anemia   History of leukocytosis   Chronic hepatitis B   Diabetic gastroparesis associated with type 2 diabetes mellitus   Abdominal pain, chronic, left lower quadrant  DKA (diabetic ketoacidoses) Resolved, no AG.  Stable this AM but still c/o nausea but has been tolerating a regular diet with vomiting.  Having seen this patient in the past, I suspect a lot of her symptoms are stemming from  depression/anxiety.   -cont carb mod diet as tolerated -d/c IVF -should be stable to go home maybe later today if tolerating a diet  Polysubstance abuse Pt has a h/o cocaine abuse that is ongoing. -CSW consult  Asthma -cont current meds  History of leukocytosis Difficult to explain but maybe d/t chronic inflammation in the setting of continued cocaine abuse.  Also may be explained by PCOS.    Thrombocytosis Likely d/t iron def anemia from chronic blood loss from uterine fibroids.  Pt reports normal periods.   -IV feraheme  Diabetic gastroparesis associated with type 2 diabetes mellitus Pt has been prescribed percocet by her PCP which we have subsequently d/c in the setting of gastroparesis.  This is certainly not indicated for chronic abdominal pain and is only exacerbating her symptoms.  It is not appropriate in the context of severe gastroparesis as this opioids will only delay gastric emptying.    Abdominal pain, chronic, left lower quadrant Pt has had multiple CT scans and other imaging for this abd pain without any etiology.  Likely perhaps from gastroparesis and may also be coming from her uterine fibroids.  Could also be ischemia from chronic cocaine use.  I would definitely NOT re-image this 39 yo lady as the etiology for her pain has been extensively worked up in the past and re-imaging this lady will only expose her to additional radiation with potential harm and no benefit.  Also dysmenorrhea may be contributing as well.  -cont reglan -encourage her to abstain from cocaine use -gastroparesis diet education -d/c percocet -tylenol PRN  -may try 1x dose of toradol (SCr has trended down)   ?Somatization disorder with depression/anxiety Pt has multiple complaints of neurological, gastrointestinal, and gynecological complaints which could represent somatization disorder in addition to depression/anxiety.   -cont home meds  Iron deficiency anemia Doubt she takes iron  supplementation at home. -IV feraheme  F/E/N Fluids- None  Electrolytes- Replete as needed  Nutrition- HH/Carb mod diet   VTE PPx  lovenox 40mg  SQ qd   Disposition Maybe discharge later today or early tomorrow pending her ability to tolerate a diet.  I am unsure if her symptoms will be completely gone as I imagine again that a lot of her symptoms are due to depression/anxiety which we are treating.      LOS: 1 day   Jones Bales, MD PGY-2, Internal Medicine Teaching Service 10/14/2014, 10:55 AM

## 2014-10-14 NOTE — Progress Notes (Signed)
Subjective: NAEON. Patient is complaining of a dry cough, LLQ abdominal pain that is a 10/10, and nausea. Patient denied any episodes of emesis overnight. Patient reports decreased appetite and that she hasn't been able to eat much.  Objective: Vital signs in last 24 hours: Filed Vitals:   10/13/14 2127 10/14/14 0501 10/14/14 0954 10/14/14 1000  BP:  115/64  126/66  Pulse: 100 103  99  Temp:  98.4 F (36.9 C)  98.2 F (36.8 C)  TempSrc:  Oral  Oral  Resp: 18 18  18   Height:      Weight:      SpO2:  100% 98% 100%   Weight change: 2.132 kg (4 lb 11.2 oz)  Intake/Output Summary (Last 24 hours) at 10/14/14 1131 Last data filed at 10/14/14 0900  Gross per 24 hour  Intake 2521.33 ml  Output    850 ml  Net 1671.33 ml   General: lying in bed, intermittently coughing, in NAD.  CV: tachycardic, regular rhythm, no murmurs, rubs, or gallops Lungs: CTAB, normal respiratory effort Abdomen: + BS, soft, Mild tenderness to palpation in LLQ, no guarding  Extremities: we perfused, warm, no edema   Lab Results: Basic Metabolic Panel:  Recent Labs  10/13/14 1010 10/14/14 0833  NA 140 136  K 3.9 4.1  CL 111 107  CO2 22 23  GLUCOSE 99 256*  BUN 19 10  CREATININE 1.49* 1.01*  CALCIUM 8.5* 8.4*   CBC:  Recent Labs  10/13/14 0052 10/13/14 0520 10/14/14 0602  WBC 15.8* 14.7* 9.1  NEUTROABS 13.1*  --  5.6  HGB 14.1 11.0* 9.7*  HCT 40.3 32.5* 28.4*  MCV 71.2* 72.2* 71.9*  PLT 496* 431* 399   CBG:  Recent Labs  10/13/14 1544 10/13/14 1644 10/13/14 1825 10/13/14 2133 10/14/14 0258 10/14/14 0755  GLUCAP 141* 152* 144* 134* 188* 225*   Hemoglobin A1C:  Recent Labs  10/13/14 1010  HGBA1C 12.9*    Urinalysis:  Recent Labs  10/13/14 1454  COLORURINE YELLOW  LABSPEC 1.033*  PHURINE 5.5  GLUCOSEU >1000*  HGBUR NEGATIVE  BILIRUBINUR SMALL*  KETONESUR 40*  PROTEINUR 30*  UROBILINOGEN 0.2  NITRITE NEGATIVE  LEUKOCYTESUR NEGATIVE    Medications: I have  reviewed the patient's current medications. Scheduled Meds: . ferumoxytol  510 mg Intravenous Once  . insulin aspart  0-20 Units Subcutaneous TID WC  . mometasone-formoterol  2 puff Inhalation BID  . sodium chloride  3 mL Intravenous Q12H   Continuous Infusions:  PRN Meds:.acetaminophen **OR** acetaminophen, benzonatate, ipratropium-albuterol, LORazepam Assessment/Plan: Principal Problem:   DKA (diabetic ketoacidoses) Active Problems:   Polysubstance abuse   Depression   Essential hypertension   Chronic ischemic heart disease   Asthma   Iron deficiency anemia   History of leukocytosis   Chronic hepatitis B   Diabetic gastroparesis associated with type 2 diabetes mellitus   Abdominal pain, chronic, left lower quadrant  Shirley [name] is a 39 yo F with a h/o chronic abdominal pain, poorly controlled T2DM with neuropathy and gastroparesis, HTN, HLD, CAD s/p angioplasty, obesity, h/o CVA, OSA, polysubstance abuse who presented with hematemesis and was found to be in DKA with AG of 17.   # Hematemesis - patient presents with hematemesis in the setting of 4 days of N/V. Per ED note, EMS reported that the patient vomited about 300 ml dark red blood. Her hematemesis since resolved. The patient has a h/o chronic n/v of unclear etiology with multiple ED visits. She had multiple CTs  over the past several years which were negative for any acute process.  The current episode of hematemesis is most likely due to [employer] tear in setting of prolonged vomiting. However, it could also be cause by PUD given h/o + H Pylori or esophageal ulcer, given h/o GERD. Pt with stable vital signs.  - Hgb at 9.7 today, down from 11 yesterday, 14 on admission. Likely from acute blood loss on day of admission, but also due to dilutional component 2/2 IVF resuscitation  - Feraheme 510mg   - trend Hgb repeat CBC tomorrow  - d/c Protonix 40 mg q12 BID - tolerating soft diet, advance diet as tolerated  - phenergan  12.5 mg q6h prn for nausea  #DKA Resolved. AG closed x 2. On admission, patient with glucose of 362, AG 17, bicarb 19. LA 1.6. She reports her CBG at home range from 200-400. She has not been taking her insulin for the past 2 weeks. Last HgbA1C on 06/2014 was 11.1. Poorly compliant with insulin at home.   #DM Poorly controlled. Last HgbA1C on 06/2014 was 11.1. Poorly compliant with insulin at home.  - Lantus 18 u qhs - SSI - consider starting metformin on dc  #Diabetic gastroparesis Patient has a h/o chronic N/V and abdominal pain. She has had multiple CT scans in the past, which were negative for any acute processes that could explain her symptoms. She had a gastric emptying scan on 01/2010, which was negative on Reglan. She underwent an EGD in 2011, which showed mild gastritis. Stomach biopsy showed moderate chronic active H Pylori gastritis, which was treated at that time. Her most recent EGD in December 2015, was negative for gastritis, but showed food residue in the gastric body c/w gastroparesis. GI recommended a f/u gastric emptying scan, but the patient refused to eat the eggs or oatmeal. She has never had a colonoscopy.   - cnt home metoclopramide 10 mg q6h - phenergan 12.5 mg q6h prn for nausea  # Chronic LLQ pain Patient has a h/o chronic LLQ abdominal pain with multiple visits to the ED. As mentioned above, multiple CT scans were negative for any acute processes that could explain her pain. She has never had a colonoscopy.  She had a transvaginal and transabdominal US on 05/20/2013, which showed an intramural posterior fibroid, with no evidence of ovarian pathology or hydrosalpinx. She has been previously referred to see and OBGYN, as her pain was thought to be gynecologic in nature 2/2 fibroid vs endometriosis, but she has not followed up.  - urine pregnancy test negative  - patient rates her LLQ pain as a 10/10. She has Tylenol 650 mg and Toradol available for pain. She has been requesting  her home percocet that she takes for the pain. We talked to the patient and explained that opioids are a poor choice for her pain control, esp given her history of gastroparesis and nausea/vomiting. Additionally, the patient is not a candidate for chronic opioids, given her h/o cocaine abuse.  Her UDS was + for cocaine at admission.   -K pad   # Cough Patient with dry cough and URI symptoms. Chest clear to auscultation. Most likely due to viral syndrome.  -Tessalon prn for cough   # Substance abuse Patient with active substance abuse. UDS+ for cocaine.  - CSW  #HTN On labetalol 100 mg BID. Consider converting to Coreg at dc - holding home labetalol  #HLD LDL 108 on 07/22/2014 - restart home Lipitor   #Asthma On  Advair and albuterol at home  - cnt albuterol q4hrs for wheezing prn  - Dulera 2 puffs BID  #Leukocytosis Patient has a h/o chronic leukocytosis of unknown etiology. WBC 15.8 on admission, baseline 11-16. Per chart review, she has been evaluated by hematology. Might be related to her anemia vs possible PCOS, since pt with h/o irregular periods, obesity. WBC at 9.1 today.   #Thrombocytosis Patient has a h/o chronic thrombocytosis of unknown etiology. Plts at 496 on admission. Possibly 2/2/ iron defficiency anemia. Anemia panel 04/22/2014 with iron less than 10, ferritin 15, TIBC incalculable. Plts 399 today.   FENGI:  - Soft, advance as tolerated  - replete electrolytes as needed   DVT ppx:  -SCDs   Dispo: Patient is not able to be discharge today given that she reports minimal PO intake. Anticipated discharge later today or tomorrow   This is a Careers information officer Note.  The care of the patient was discussed with Dr. Gordy Levan and the assessment and plan formulated with their assistance.  Please see their attached note for official documentation of the daily encounter.   LOS: 1 day   [employer], Med Student 10/14/2014, 11:31 AM

## 2014-10-14 NOTE — Progress Notes (Signed)
Patient continuously requests to see the doctor.  States that she is ready to go home.  Notified on-call MD regarding situation.  Will continue to monitor.

## 2014-10-14 NOTE — Progress Notes (Signed)
Patient complains of 10/10 abdominal pain.  Refused tylenol.  Requested other pain medication.  MD on call notified.

## 2014-10-14 NOTE — Progress Notes (Signed)
10/14/2014 10:40 AM  Spoke to patient regarding care concerns and assisted in service recovery. Patient still very upset and requesting a new MD to follow her care. Will inform MD. Will inform Leadership.   Lorenso Quarry, Department Director at bedside.   [employer], RN-BC, RN3 [facility] [address] Phone [phone]  Activate Charge Nurse (at this time)

## 2014-10-14 NOTE — Progress Notes (Signed)
Patient ID: Shirley [name], female   DOB: [birth year], 39 y.o.   MRN: [id number] Medicine attending: I examined this patient this morning together with resident physician Dr. Loyal Jacobson and acting intern Ms. Anna Corcimaru and I concur with her evaluation and management plan. No further vomiting or hematemesis. Hemoglobin has drifted down  To 9.7. Still not eating. She took one bite of Jell-O and one bite of the biscuit. She has developed a dry cough. Lungs are clear. Chest radiograph on admission June 21 was normal. We will administer a dose of parenteral iron. I discussed possibility of allergic reaction with the patient. She states she has had IV iron in the past without any problems. It is clear from the interactions that she has had with our diabetes educator and case managers that she has no interest in taking responsibility for her own healthcare even when offered assistance.

## 2014-10-14 NOTE — Progress Notes (Addendum)
Patient continues to request to speak to MD on call regarding discharge and pain medication.  MD called this RN and spoke to patient via phone.  Awaiting orders.  Will continue to monitor.

## 2014-10-14 NOTE — Progress Notes (Signed)
Attempted numerous times to give pt tylenol and tramadol, but pt declined offer. Took K pad in room for pt's use and explained the purpose of the K pad but pt refused to use it also. Pt now requesting to go home; will inform MD.

## 2014-10-15 DIAGNOSIS — E1143 Type 2 diabetes mellitus with diabetic autonomic (poly)neuropathy: Secondary | ICD-10-CM

## 2014-10-15 DIAGNOSIS — R059 Cough, unspecified: Secondary | ICD-10-CM | POA: Insufficient documentation

## 2014-10-15 DIAGNOSIS — D509 Iron deficiency anemia, unspecified: Secondary | ICD-10-CM

## 2014-10-15 DIAGNOSIS — F418 Other specified anxiety disorders: Secondary | ICD-10-CM

## 2014-10-15 DIAGNOSIS — K3184 Gastroparesis: Secondary | ICD-10-CM

## 2014-10-15 DIAGNOSIS — R05 Cough: Secondary | ICD-10-CM | POA: Insufficient documentation

## 2014-10-15 LAB — GLUCOSE, CAPILLARY
GLUCOSE-CAPILLARY: 198 mg/dL — AB (ref 65–99)
GLUCOSE-CAPILLARY: 284 mg/dL — AB (ref 65–99)
Glucose-Capillary: 302 mg/dL — ABNORMAL HIGH (ref 65–99)

## 2014-10-15 MED ORDER — KETOROLAC TROMETHAMINE 15 MG/ML IJ SOLN
15.0000 mg | Freq: Once | INTRAMUSCULAR | Status: AC
  Administered 2014-10-15: 15 mg via INTRAVENOUS
  Filled 2014-10-15: qty 1

## 2014-10-15 MED ORDER — FERROUS SULFATE 325 (65 FE) MG PO TABS: 325.0000 mg | ORAL_TABLET | Freq: Every day | ORAL | Status: DC

## 2014-10-15 MED ORDER — BENZONATATE 100 MG PO CAPS: 100.0000 mg | ORAL_CAPSULE | Freq: Two times a day (BID) | ORAL | Status: DC | PRN

## 2014-10-15 NOTE — Progress Notes (Signed)
Pt taken down to the ED via wheelchair for her ride to pick her up. Pt with no signs and symptoms of distress. Educated to return to the ED in the event of SOB or chest pain. Dorthey Sawyer, RN

## 2014-10-15 NOTE — Discharge Instructions (Signed)
Please keep your follow-up appointments; this is very important for your continued recovery.    We have made the following additions/changes to your medications:   **Please refer to your medication list.   **Do not use percocet for abdominal pain as this will only worsen your gastroparesis.  We feel you need to follow up with gynecology when you leave the hospital.    Please continue to take all of your medications as prescribed.  Do not miss any doses without contacting your primary physician.  If you have questions, please contact your physician or contact the Internal Medicine Teaching Service at [phone].  Please bring your medicications with you to your appointments; medications may be eye drops, herbals, vitamins, or pills.    If you believe you are suffering from a life-threatening emergency, go to the nearest Emergency Department.

## 2014-10-15 NOTE — Progress Notes (Signed)
Patient continues to complain of abdominal pain.  Was administered tylenol PRN, but also requested for other pain medication.  Notified MD on call.  Stated that patient could not receive any more pain medication.  No new orders received.  Will continue to monitor.

## 2014-10-15 NOTE — Care Management Note (Signed)
Case Management Note  Patient Details  Name: SAHRA CONVERSE MRN: [id number] Date of Birth: June 16, 1975  Subjective/Objective:                CM following for progression and d/c planning    Action/Plan:  No d/c needs identified. This CM contacted [facility] and spoke with Maudie Mercury, who related [facility] ongoing efforts to engage this pt for followup and community support. To this date this pt has refused all efforts. [facility] will continue to attempt to reach this pt.  Expected Discharge Date:       10/15/2014           Expected Discharge Plan:  Home/Self Care  In-House Referral:  NA  Discharge planning Services  CM Consult, NA  Post Acute Care Choice:  NA Choice offered to:  Patient, NA  DME Arranged:    DME Agency:     HH Arranged:  RN [place] Agency:     Status of Service:  In process, will continue to follow  Medicare Important Message Given:  No Date Medicare IM Given:    Medicare IM give by:    Date Additional Medicare IM Given:    Additional Medicare Important Message give by:     If discussed at [place] of Stay Meetings, dates discussed:    Additional Comments:  Adron Bene, RN 10/15/2014, 4:16 PM

## 2014-10-15 NOTE — Discharge Summary (Signed)
Name: Shirley [name] MRN: [id number] DOB: [birth year] 39 y.o. PCP: Collier Salina, MD ______________________________________________________________  Date of Admission: 10/13/2014 12:24 AM Date of Discharge: 10/15/2014 Attending Physician: Annia Belt, MD   Discharge Diagnosis: Hematemesis  DKA   DM  Diabetic gastroparesis  Chronic LLQ pain  Cough  Substance abuse  HTN  HLD  Asthma   Chronic leukocytosis  Thrombocytosis Iron deficiency anemia  Discharge Medications:   Medication List    STOP taking these medications        chlorpheniramine-HYDROcodone 10-8 MG/5ML Lqcr  Commonly known as:  TUSSIONEX     doxycycline 100 MG capsule  Commonly known as:  VIBRAMYCIN     levofloxacin 750 MG tablet  Commonly known as:  LEVAQUIN     metroNIDAZOLE 500 MG tablet  Commonly known as:  FLAGYL     oxyCODONE-acetaminophen 5-325 MG per tablet  Commonly known as:  PERCOCET      TAKE these medications        ADVAIR DISKUS 100-50 MCG/DOSE Aepb  Generic drug:  Fluticasone-Salmeterol  Inhale 1 puff into the lungs 2 (two) times daily.     albuterol 108 (90 BASE) MCG/ACT inhaler  Commonly known as:  PROVENTIL HFA;VENTOLIN HFA  Inhale 2 puffs into the lungs every 4 (four) hours as needed for wheezing or shortness of breath.     atorvastatin 80 MG tablet  Commonly known as:  LIPITOR  Take 1 tablet (80 mg total) by mouth daily at 6 PM.     BD PEN NEEDLE NANO U/F 32G X 4 MM Misc  Generic drug:  Insulin Pen Needle  USE AS DIRECTED     benzonatate 100 MG capsule  Commonly known as:  TESSALON  Take 1 capsule (100 mg total) by mouth 2 (two) times daily as needed for cough.     dicyclomine 20 MG tablet  Commonly known as:  BENTYL  Take 1 tablet (20 mg total) by mouth 2 (two) times daily.     ferrous sulfate 325 (65 FE) MG tablet  Take 1 tablet (325 mg total) by mouth daily with breakfast.     gabapentin 300 MG capsule  Commonly known as:  NEURONTIN  Take  300 mg by mouth 3 (three) times daily.     insulin aspart 100 UNIT/ML injection  Commonly known as:  novoLOG  Sliding scale- If sugar 150-199: inject 2 unit;  200-249: 4 units, 250-299: 7 units; 300-349: 10 units; Over 350: 12 units PENS PLEASE     labetalol 100 MG tablet  Commonly known as:  NORMODYNE  Take 1 tablet (100 mg total) by mouth 2 (two) times daily.     LANTUS SOLOSTAR 100 UNIT/ML Solostar Pen  Generic drug:  Insulin Glargine  Inject 50 Units into the skin daily at 10 pm.     LORazepam 1 MG tablet  Commonly known as:  ATIVAN  Take 1 mg by mouth every 8 (eight) hours as needed for anxiety.     metoCLOPramide 10 MG tablet  Commonly known as:  REGLAN  Take 1 tablet (10 mg total) by mouth every 6 (six) hours.     omeprazole 40 MG capsule  Commonly known as:  PRILOSEC  Take 1 capsule (40 mg total) by mouth daily.     promethazine 25 MG tablet  Commonly known as:  PHENERGAN  Take 1 tablet (25 mg total) by mouth every 6 (six) hours as needed for nausea or vomiting.  Disposition and follow-up:   Shirley [name] was discharged from [facility] in good condition to home.  Please address the following problems post-discharge:  1.Depression and counseling?   2.Chronic abdominal pain-->avoiding opioids in this patient with a h/o gastroparesis  3.Substance abuse counseling  4.Compliance with medications, especially insulin  5.Pt has refused [facility] services in the past but I feel this would be beneficial to her overall health and offer her some additional support (in addition to helping avoid readmissions) 6.Complaince with gastroparesis diet  Labs / imaging needed at time of follow-up: None   Pending labs/ test needing follow-up: None  Follow-up Appointments: Follow-up Information    Follow up with Ricke Hey, MD On 10/22/2014.   Specialty:  Family Medicine   Why:   at 4:15 pm   Contact information:   [address] [place] [id number] [phone]       Discharge Instructions: Discharge Instructions    Diet - low sodium heart healthy    Complete by:  As directed      Discharge instructions    Complete by:  As directed   Please follow up with your primary physician after you are discharged.     Increase activity slowly    Complete by:  As directed            Consultations:    Procedures Performed:  Dg Chest 2 View  10/13/2014   CLINICAL DATA:  Acute onset of cough, shortness of breath and wheezing. Leukocytosis. Initial encounter.  EXAM: CHEST  2 VIEW  COMPARISON:  Chest radiograph performed 07/25/2014  FINDINGS: The lungs are well-aerated and clear. There is no evidence of focal opacification, pleural effusion or pneumothorax.  The heart is normal in size; the mediastinal contour is within normal limits. No acute osseous abnormalities are seen.  IMPRESSION: No acute cardiopulmonary process seen.   Electronically Signed   By: Garald Balding M.D.   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 She has been vomiting bile colored fluid x 4 days and has been unable to tolerate po or liquids. She then started having hematemesis 1 day after the onset of her vomiting. She states the amount of blood in her vomit has remained consistent. Denies hx of hematemesis, she denies sore throat. She took 2 tablets of advil today for abd pain and asa daily. She has chronic LLQ abd pain. She has dizziness and fatigue. Denies dark stools, hematochezia, and weakness. She states that prior to onset of vomiting she starting feeling like she was getting the flu as she was having a  productive cough of green phlegm, SOB, fevers, and wheezing. Wheezing did not improve w/ her home inhalers. She was also having chest pain that is located at her upper mid epigastrium that does not radiate and feels like a sharp sensation. She has had chest pain like this in the past. She states chest pain is intermittent and she cannot tell what brings the pain on or off. She rates chest pain as 7/10. She last used cocaine 4 days ago. She has a long hx of chronic n/v that has never been controlled. She was last hospitalized in 06/2013 for  n/v and chest pain. Chest pain was thought to be due to recent cocaine use and n/v. Nausea and vomiting was likely 2/2 Dm gastroparesis and cocaine use which can delay gastric emptying. She had an EGD in 03/2014 with findings suggestive of gastroparesis. Biopsy findings are consistent with active gastritis. She is unable to get a gastric emptying study b/c she is allergic to eggs. She is on Reglan 10 mg every 6 hours and Prilosec 20 mg though but has not been taking any of her medications x 2 weeks including her insulin. She states her CBGs range from the 200-400's at home. She has had 12 CT abd/pelvis since 2006 w/ most recent one on 12/31/2013 WNL. On exam pt had 100cc of hematemesis in her emesis bag. In the ED, she was given Reglan 10 mg IV, Zofran 4 mg IV, protonix 40mg  IV, toradol 30mg  IV, morphine 4mg  IV, and 2 L normal saline bolus.   Original H&P by Julious Oka, MD  Hospital Course by problem list: Principal Problem:   DKA (diabetic ketoacidoses) Active Problems:   Polysubstance abuse   Depression   Essential hypertension   Chronic ischemic heart disease   Asthma   Iron deficiency anemia   History of leukocytosis   Chronic hepatitis B   Diabetic gastroparesis associated with type 2 diabetes mellitus   Abdominal pain, chronic, left lower quadrant   Cough   Hematemesis Patient presented with hematemesis in the setting of 4 days of N/V. Per ED note, EMS  reported that the patient vomited about 300 ml dark red blood. She has never had hematemesis before. The patient has a h/o chronic n/v of unclear etiology with multiple ED visits. She had multiple CTs over the past several years which were negative for any acute process. She has a history of gastroparesis as per below. Her hematemesis had resolved on admission.We suspect that her hematemesis was most likely due to a Mallory Weiss tear in the setting of prolonged vomiting, which could have been precipitated by a viral gastroenteritis vs her gastroparesis. On admission she was normotensive, but mildly tachycardic. Her Hgb was 14 on admission (her baseline Hgb is 11-12), but trended down to 11, then 9.7. This was most likely due to her episode of hematemesis but also due to a significant dilutional component from fluid resuscitation. She remained asymptomatic, her tachycardia had resolved post IV hydration. She received one dose of feraheme.  We slowly advanced her diet, and she was tolerating a normal diet on discharge, without any nausea of vomiting. She should continue to take her iron supplements at home. She should follow up with her PCP to get her Hgb checked in order to rule out any additional GI bleeding.   DKA The patient admitted that she has not been taking her insulin for the past 2 weeks. On admission, patient had a blood glucose of 362, AG 17, bicarb 19. LA 1.6. Patient was treated with fluids and an insulin infusion. We transitioned her to lantus and SSI once her DKA resolved. Patient should follow up with PCP and compliance with insulin should be addressed.   DM Patient with poorly controlled DM. Last HgbA1C on 06/2014 was 11.1. She reports her CBG at home range from 200-400. She is poorly compliant with insulin at home. We suggested to start Metformin upon discharge, but patient states that she took Metformin in the past and struggled with side effects. We defer this decision to her PCP. Patient  should follow up with PCP  and compliance with insulin should be addressed.   Diabetic gastroparesis Patient has a h/o chronic N/V and abdominal pain. She has had multiple CT scans in the past, which were negative for any acute processes that could explain her symptoms. She had a gastric emptying scan on 01/2010, which was negative on Reglan. She underwent an EGD in 2011, which showed mild gastritis. Stomach biopsy showed moderate chronic active H Pylori gastritis, which was treated at that time. Her most recent EGD in December 2015, was negative for gastritis, but showed food residue in the gastric body c/w gastroparesis. GI recommended a f/u gastric emptying scan, but the patient refused to eat the eggs or oatmeal. She has never had a colonoscopy. She should continue to take her home metoclopramide and phenergan prn for nausea. We counseled her on cocaine cessation. We discussed that she should avoid opioids for chronic pain management, as these will contribute to her gastroparesis, as per below.   Chronic LLQ pain Patient has a h/o chronic LLQ abdominal pain with multiple visits to the ED, where she presented with similar symptoms. As mentioned above, multiple CT scans were negative for any acute processes that could explain her pain. She has never had a colonoscopy. She had a transvaginal and transabdominal US on 05/20/2013, which showed an intramural posterior fibroid, with no evidence of ovarian pathology or hydrosalpinx. She has been previously referred to see an OBGYN, as her pain was thought to be gynecologic in nature 2/2 fibroid vs endometriosis, but she has not followed up. On admission, her urine pregnancy test was negative. During the admission, The patient continued to report LLQ pain that was a 10/10. We provided her with Tylenol, Toradol, and Kpad for her pain, which she repeatedly refused. She requested Percocet, stating that that is what helps her pain at home and repeatedly wanted to leave the  hospital stating that we were not adequately treating her pain. Per pharmacy records, she last filled her Percocet prescription on 5/24. Of note, the patient UDS on admission was positive for cocaine, negative for opioids. We talked to the patient and explained that opioids are a poor choice for her pain control, esp given her history of gastroparesis and nausea/vomiting. Additionally, the patient is not a candidate for chronic opioids, given her active cocaine abuse. Please avoid prescribing chronic opioids for pain control. We do feel that she would benefit from seeing an OBGYN as an outpatient, as her LLQ could be gynecologic in nature and could be due to her fibroid that was documented on her last abdominal US vs endometriosis.   Cough Patient presented with dry cough and URI symptoms. On admission, she had wheezing on auscultation. Her wheezing had resolved with Duoneb treatments. CXR was negative for pneumonia. Her symptoms were most likely due to viral syndrome. We symptomatically treated her for her cough. She should continue her home asthma medications after discharge.   Substance abuse Patient with active substance abuse. UDS+ for cocaine. We counseled her on abstaining from cocaine.   HTN Patient is on labetalol 100 mg BID at home. We held her labetalol. She was normotensive throughout her hospitalization.   HLD LDL 108 on 07/22/2014. We held her home Lipitor due to NPO status. We restarted her home dose of Lipitor on discharge.   Asthma On Advair and albuterol at home. We continued her albuterol, she received scheduled Dulera. We resumed her home medications on discharge.   Leukocytosis Patient has a h/o chronic leukocytosis of unknown etiology. WBC  15.8 on admission, baseline 11-16. Per chart review, she has been evaluated by hematology. Might be related to her anemia vs cocaine use vs possible PCOS, since pt with h/o irregular periods, obesity.   Thrombocytosis Patient has a h/o  chronic thrombocytosis of unknown etiology. Plts at 496 on admission. Possibly 2/2/ iron defficiency anemia. Anemia panel 04/22/2014 with iron less than 10, ferritin 15, TIBC incalculable. She received feraheme x 1.  She should continue her home iron supplements.   Iron deficiency anemia Feraheme x 1 given.   Discharge Vitals:   BP 146/76 mmHg  Pulse 91  Temp(Src) 97.8 F (36.6 C) (Oral)  Resp 16  Ht 5\' 7"  (1.702 m)  Wt 221 lb 9.6 oz (100.517 kg)  BMI 34.70 kg/m2  SpO2 99%  LMP 09/13/2014  Discharge Labs:  Results for orders placed or performed during the hospital encounter of 10/13/14 (from the past 24 hour(s))  Glucose, capillary     Status: Abnormal   Collection Time: 10/14/14  4:38 PM  Result Value Ref Range   Glucose-Capillary 231 (H) 65 - 99 mg/dL  Glucose, capillary     Status: Abnormal   Collection Time: 10/14/14  9:12 PM  Result Value Ref Range   Glucose-Capillary 263 (H) 65 - 99 mg/dL  Glucose, capillary     Status: Abnormal   Collection Time: 10/15/14  7:37 AM  Result Value Ref Range   Glucose-Capillary 198 (H) 65 - 99 mg/dL  Glucose, capillary     Status: Abnormal   Collection Time: 10/15/14 11:42 AM  Result Value Ref Range   Glucose-Capillary 302 (H) 65 - 99 mg/dL    Signed: Jones Bales, MD 10/15/2014, 2:41 PM   Services Ordered on Discharge: [facility] but patient declined services Equipment Ordered on Discharge: None

## 2014-10-15 NOTE — Progress Notes (Signed)
Shirley [name] to be D/C'd Home per MD order.  Discussed prescriptions and follow up appointments with the patient. Prescriptions given to patient, medication list explained in detail. Pt verbalized understanding.    Medication List    STOP taking these medications        chlorpheniramine-HYDROcodone 10-8 MG/5ML Lqcr  Commonly known as:  TUSSIONEX     doxycycline 100 MG capsule  Commonly known as:  VIBRAMYCIN     levofloxacin 750 MG tablet  Commonly known as:  LEVAQUIN     metroNIDAZOLE 500 MG tablet  Commonly known as:  FLAGYL     oxyCODONE-acetaminophen 5-325 MG per tablet  Commonly known as:  PERCOCET      TAKE these medications        ADVAIR DISKUS 100-50 MCG/DOSE Aepb  Generic drug:  Fluticasone-Salmeterol  Inhale 1 puff into the lungs 2 (two) times daily.     albuterol 108 (90 BASE) MCG/ACT inhaler  Commonly known as:  PROVENTIL HFA;VENTOLIN HFA  Inhale 2 puffs into the lungs every 4 (four) hours as needed for wheezing or shortness of breath.     atorvastatin 80 MG tablet  Commonly known as:  LIPITOR  Take 1 tablet (80 mg total) by mouth daily at 6 PM.     BD PEN NEEDLE NANO U/F 32G X 4 MM Misc  Generic drug:  Insulin Pen Needle  USE AS DIRECTED     benzonatate 100 MG capsule  Commonly known as:  TESSALON  Take 1 capsule (100 mg total) by mouth 2 (two) times daily as needed for cough.     dicyclomine 20 MG tablet  Commonly known as:  BENTYL  Take 1 tablet (20 mg total) by mouth 2 (two) times daily.     ferrous sulfate 325 (65 FE) MG tablet  Take 1 tablet (325 mg total) by mouth daily with breakfast.     gabapentin 300 MG capsule  Commonly known as:  NEURONTIN  Take 300 mg by mouth 3 (three) times daily.     insulin aspart 100 UNIT/ML injection  Commonly known as:  novoLOG  Sliding scale- If sugar 150-199: inject 2 unit;  200-249: 4 units, 250-299: 7 units; 300-349: 10 units; Over 350: 12 units PENS PLEASE     labetalol 100 MG tablet  Commonly known  as:  NORMODYNE  Take 1 tablet (100 mg total) by mouth 2 (two) times daily.     LANTUS SOLOSTAR 100 UNIT/ML Solostar Pen  Generic drug:  Insulin Glargine  Inject 50 Units into the skin daily at 10 pm.     LORazepam 1 MG tablet  Commonly known as:  ATIVAN  Take 1 mg by mouth every 8 (eight) hours as needed for anxiety.     metoCLOPramide 10 MG tablet  Commonly known as:  REGLAN  Take 1 tablet (10 mg total) by mouth every 6 (six) hours.     omeprazole 40 MG capsule  Commonly known as:  PRILOSEC  Take 1 capsule (40 mg total) by mouth daily.     promethazine 25 MG tablet  Commonly known as:  PHENERGAN  Take 1 tablet (25 mg total) by mouth every 6 (six) hours as needed for nausea or vomiting.        Filed Vitals:   10/15/14 1005  BP: 146/76  Pulse: 91  Temp: 97.8 F (36.6 C)  Resp: 16    Skin clean, dry and intact without evidence of skin break down, no evidence of  skin tears noted. IV catheter discontinued intact. Site without signs and symptoms of complications. Dressing and pressure applied. Pt denies pain at this time. No complaints noted.  An After Visit Summary was printed and given to the patient. Patient escorted via [place], and D/C home via private auto.  Marijean Heath C 10/15/2014 6:10 PM

## 2014-10-15 NOTE — Discharge Summary (Signed)
Patient Name: Shirley [name]  MRN:  [id number]   DOB: June 21, 1975   PCP: Ricke Hey, MD        Date of Admission: 10/13/2014  Date of Discharge: 10/15/2014        Attending Physician: Annia Belt, MD      DISCHARGE DIAGNOSES: Primary:  Hematemesis Secondary: DKA Polysubstance abuse  Depression  Essential hypertension  Chronic ischemic heart disease  Asthma  Iron deficiency anemia  History of leukocytosis  Chronic hepatitis B  Diabetic gastroparesis associated with type 2 diabetes mellitus  Abdominal pain, chronic, left lower quadrant  Cough  DISPOSITION AND FOLLOW-UP: Shirley [name] is to follow-up with the listed providers as detailed below, at which time, the following should be addressed:   1. Please repeat a CBC to rule out any ongoing GI bleeding. Her Hgb at discharge was 9.7.  2. Please avoid prescribing chronic opioids for pain control. She has a history of cocaine use and her UDS was + for cocaine on admission. Additionally, given her history of gastroparesis and chronic nausea and vomiting, opioids might further exacerbate her motility problems and contribute to her delayed gastric emptying and nausea/vomiting.  3. Please consider an OBGYN referral for her chronic LLQ pain. 4. Please address compliance with her home medications.  5. Please address her substance abuse. 6.  7. Labs / imaging needed at time of follow-up: none  8. Pending labs/ test needing follow-up: none    DISCHARGE INSTRUCTIONS: Follow-up Information    Follow up with Ricke Hey, MD On 10/22/2014.   Specialty:  Family Medicine   Why:   at 4:15 pm   Contact information:   [address] [place] [address] [phone]      Discharge Instructions    Diet - low sodium heart healthy    Complete by:  As directed      Discharge instructions    Complete by:  As directed   Please follow up with your primary physician after you are discharged.       Increase activity slowly    Complete by:  As directed            DISCHARGE MEDICATIONS:   Medication List    STOP taking these medications        chlorpheniramine-HYDROcodone 10-8 MG/5ML Lqcr  Commonly known as:  TUSSIONEX     doxycycline 100 MG capsule  Commonly known as:  VIBRAMYCIN     levofloxacin 750 MG tablet  Commonly known as:  LEVAQUIN     metroNIDAZOLE 500 MG tablet  Commonly known as:  FLAGYL     oxyCODONE-acetaminophen 5-325 MG per tablet  Commonly known as:  PERCOCET      TAKE these medications        ADVAIR DISKUS 100-50 MCG/DOSE Aepb  Generic drug:  Fluticasone-Salmeterol  Inhale 1 puff into the lungs 2 (two) times daily.     albuterol 108 (90 BASE) MCG/ACT inhaler  Commonly known as:  PROVENTIL HFA;VENTOLIN HFA  Inhale 2 puffs into the lungs every 4 (four) hours as needed for wheezing or shortness of breath.     atorvastatin 80 MG tablet  Commonly known as:  LIPITOR  Take 1 tablet (80 mg total) by mouth daily at 6 PM.     BD PEN NEEDLE NANO U/F 32G X 4 MM Misc  Generic drug:  Insulin Pen Needle  USE AS DIRECTED     benzonatate 100 MG capsule  Commonly known as:  TESSALON  Take 1 capsule (100 mg total) by mouth 2 (two) times daily as needed for cough.     dicyclomine 20 MG tablet  Commonly known as:  BENTYL  Take 1 tablet (20 mg total) by mouth 2 (two) times daily.     ferrous sulfate 325 (65 FE) MG tablet  Take 1 tablet (325 mg total) by mouth daily with breakfast.     gabapentin 300 MG capsule  Commonly known as:  NEURONTIN  Take 300 mg by mouth 3 (three) times daily.     insulin aspart 100 UNIT/ML injection  Commonly known as:  novoLOG  Sliding scale- If sugar 150-199: inject 2 unit;  200-249: 4 units, 250-299: 7 units; 300-349: 10 units; Over 350: 12 units PENS PLEASE     labetalol 100 MG tablet  Commonly known as:  NORMODYNE  Take 1 tablet (100 mg total) by mouth 2 (two) times daily.     LANTUS SOLOSTAR 100 UNIT/ML  Solostar Pen  Generic drug:  Insulin Glargine  Inject 50 Units into the skin daily at 10 pm.     LORazepam 1 MG tablet  Commonly known as:  ATIVAN  Take 1 mg by mouth every 8 (eight) hours as needed for anxiety.     metoCLOPramide 10 MG tablet  Commonly known as:  REGLAN  Take 1 tablet (10 mg total) by mouth every 6 (six) hours.     omeprazole 40 MG capsule  Commonly known as:  PRILOSEC  Take 1 capsule (40 mg total) by mouth daily.     promethazine 25 MG tablet  Commonly known as:  PHENERGAN  Take 1 tablet (25 mg total) by mouth every 6 (six) hours as needed for nausea or vomiting.        PROCEDURES PERFORMED:  Dg Chest 2 View  10/13/2014   CLINICAL DATA:  Acute onset of cough, shortness of breath and wheezing. Leukocytosis. Initial encounter.  EXAM: CHEST  2 VIEW  COMPARISON:  Chest radiograph performed 07/25/2014  FINDINGS: The lungs are well-aerated and clear. There is no evidence of focal opacification, pleural effusion or pneumothorax.  The heart is normal in size; the mediastinal contour is within normal limits. No acute osseous abnormalities are seen.  IMPRESSION: No acute cardiopulmonary process seen.   Electronically Signed   By: Garald Balding M.D.   On: 10/13/2014 04:51      EKG Interpretation  Date/Time:Tuesday October 13 2014 00:34:40 EDT Ventricular Rate: 118 PR Interval:112 QRS Duration:83 QT Interval:339 QTC Calculation:475 R Axis:59 Text Interpretation: Sinus tachycardia Multiform ventricular premature complexes Confirmed by DELO MD, Nathaneil Canary ([id number]) on 10/13/2014 12:44:12 AM  ADMISSION DATA: H&P: Ms. Cardin is a 39 year old female with chronic abdominal pain, poorly controlled type 2 diabetes w/ neuropathy and gastroparesis, hypertension, hyperlipidemia, coronary artery disease status post angioplasty, morbid obesity, history of CVA, obstructive sleep apnea,  polysubstance abuse who presented to the ED with hematemesis. She has been vomiting bile colored fluid x 4 days and has been unable to tolerate po or liquids. She then started having hematemesis 1 day after the onset of her vomiting. She has not had hematemesis before. She has dizziness and fatigue. Denies dark stools, hematochezia, and weakness.  She has chronic LLQ abd pain. She states that prior to onset of vomiting she starting feeling like she was getting the flu as she was having a productive cough of green phlegm, SOB, fevers, and wheezing. Wheezing did not improve w/ her home inhalers.  She was also having chest pain that is located at her upper mid epigastrium that does not radiate and feels like a sharp sensation. She has had chest pain like this in the past. She states chest pain is intermittent and she cannot tell what brings the pain on or off. She rates chest pain as 7/10. She last used cocaine 4 days ago. She has a long hx of chronic n/v that has never been controlled with numerous admissions to the ED over the past couple of months. She has had 12 CT abd/pelvis since 2006 w/ most recent one on 12/31/2013 WNL. She has been non compliant with all of her medications, including insulin, for the past two weeks. She states her CBGs range from the 200-400's at home.  Per ED note, EMS reported 300 cc dark red blood emesis. On exam in the ED she had tachycardia to the low 100s, dry mucous membranes, respiratory wheezing and LLQ tenderness on abdominal exam. She had 100cc of hematemesis in her emesis bag. Labs showed WBC 15.8, Hgb 14, negative troponins, and normal CXR. She had a glucose of 362, AG elevated at 17, bicarb 19. UDS was positive for cocaine. CXR was normal. EKG showed sinus tachycardia. In the ED, she was given Reglan 10 mg IV, Zofran 4 mg IV, protonix 40mg  IV, toradol 30mg  IV, morphine 4mg  IV, and 2 L normal saline bolus.    Physical Exam: Blood pressure 108/64, pulse 104, temperature 98.5 F  (36.9 C), temperature source Oral, resp. rate 14, height 5\' 7"  (1.702 m), weight 210 lb (95.255 kg), last menstrual period 09/13/2014, SpO2 100 %. Physical Exam  Constitutional: She appears well-developed and well-nourished. She appears distressed.  HENT:  Dry mucous membranes Cardiovascular: Regular rhythm. Tachycardia present.  No murmur heard. Pulmonary/Chest: Effort normal. She has wheezes. She exhibits no tenderness.  Abdominal: Soft. Bowel sounds are normal. There is tenderness (LLQ). There is no guarding.  Musculoskeletal: She exhibits no edema.  Skin: Skin is warm and dry.    Lab results: Basic Metabolic Panel:  Recent Labs (last 2 labs)      Recent Labs  10/13/14 0052  NA 134*  K 4.0  CL 98*  CO2 19*  GLUCOSE 362*  BUN 14  CREATININE 1.19*  CALCIUM 10.0     Liver Function Tests:  Recent Labs (last 2 labs)      Recent Labs  10/13/14 0052  AST 21  ALT 9*  ALKPHOS 78  BILITOT 1.0  PROT 9.5*  ALBUMIN 3.9      Recent Labs (last 2 labs)      Recent Labs  10/13/14 0052  LIPASE 19*     CBC:  Recent Labs (last 2 labs)      Recent Labs  10/13/14 0052  WBC 15.8*  NEUTROABS 13.1*  HGB 14.1  HCT 40.3  MCV 71.2*  PLT 496*     Urine Drug Screen: Drugs of Abuse   Labs (Brief)       Component Value Date/Time   LABOPIA NONE DETECTED 07/24/2014 2318   LABOPIA NEGATIVE 02/21/2010 2201   COCAINSCRNUR POSITIVE* 07/24/2014 2318   COCAINSCRNUR NEGATIVE 02/21/2010 2201   LABBENZ NONE DETECTED 07/24/2014 2318   LABBENZ NEGATIVE 02/21/2010 2201   AMPHETMU NONE DETECTED 07/24/2014 2318   AMPHETMU NEGATIVE 02/21/2010 2201   THCU NONE DETECTED 07/24/2014 2318   LABBARB NONE DETECTED 07/24/2014 2318             HOSPITAL COURSE BY PROBLEM LIST:  #  Hematemesis - patient presented with hematemesis in the setting of 4 days of N/V. Per ED note, EMS reported  that the patient vomited about 300 ml dark red blood. She has never had hematemesis before. The patient has a h/o chronic n/v of unclear etiology with multiple ED visits. She had multiple CTs over the past several years which were negative for any acute process. She has a history of gastroparesis as per below. Her hematemesis had resolved on admission.We suspect that her hematemesis was most likely due to a Mallory Weiss tear in the setting of prolonged vomiting, which could have been precipitated by a viral gastroenteritis vs her gastroparesis. On admission she was normotensive, but mildly tachycardic. Her Hgb was 14 on admission (her baseline Hgb is 11-12), but trended down to 11, then 9.7. This was most likely due to her episode of hematemesis but also due to a significant dilutional component from fluid resuscitation. She remained asymptomatic, her tachycardia had resolved post IV hydration. She received a Feraheme infusion. We slowly advanced her diet, and she was tolerating a normal diet on discharge, without any nausea of vomiting. She should continue to take her iron supplements at home. She should follow up with her PCP to get her Hgb checked in order to rule out any additional GI bleeding.   #DKA The patient admitted that she has not been taking her insulin for the past 2 weeks. On admission, patient had a blood glucose of 362, AG 17, bicarb 19. LA 1.6. Patient was treated with fluids and an insulin infusion. We transitioned her to lantus and SSI once her DKA resolved. Patient should follow up with PCP and compliance with insulin should be addressed.   #DM Patient with poorly controlled DM. Last HgbA1C on 06/2014 was 11.1. She reports her CBG at home range from 200-400. She is poorly compliant with insulin at home. We suggested to start Metformin upon discharge, but patient states that she took Metformin in the past and struggled with side effects. We defer this decision to her PCP. Patient should  follow up with PCP and compliance with insulin should be addressed.   #Diabetic gastroparesis Patient has a h/o chronic N/V and abdominal pain. She has had multiple CT scans in the past, which were negative for any acute processes that could explain her symptoms. She had a gastric emptying scan on 01/2010, which was negative on Reglan. She underwent an EGD in 2011, which showed mild gastritis. Stomach biopsy showed moderate chronic active H Pylori gastritis, which was treated at that time. Her most recent EGD in December 2015, was negative for gastritis, but showed food residue in the gastric body c/w gastroparesis. GI recommended a f/u gastric emptying scan, but the patient refused to eat the eggs or oatmeal. She has never had a colonoscopy. She should continue to take her home metoclopramide and phenergan prn for nausea. We counseled her on cocaine cessation. We discussed that she should avoid opioids for chronic pain management, as these will contribute to her gastroparesis, as per below.   # Chronic LLQ pain Patient has a h/o chronic LLQ abdominal pain with multiple visits to the ED, where she presented with similar symptoms. As mentioned above, multiple CT scans were negative for any acute processes that could explain her pain. She has never had a colonoscopy. She had a transvaginal and transabdominal US on 05/20/2013, which showed an intramural posterior fibroid, with no evidence of ovarian pathology or hydrosalpinx. She has been previously referred to see  an OBGYN, as her pain was thought to be gynecologic in nature 2/2 fibroid vs endometriosis, but she has not followed up. On admission, her urine pregnancy test was negative. During the admission, The patient continued to report LLQ pain that was a 10/10. We provided her with Tylenol, Toradol, and Kpad for her pain, which she repeatedly refused. She requested Percocet, stating that that is what helps her pain at home and repeatedly wanted to leave the  hospital stating that we were not adequately treating her pain. Per pharmacy records, she last filled her Percocet prescription on 5/24. Of note, the patient UDS on admission was positive for cocaine, negative for opioids. We talked to the patient and explained that opioids are a poor choice for her pain control, esp given her history of gastroparesis and nausea/vomiting. Additionally, the patient is not a candidate for chronic opioids, given her active cocaine abuse. Please avoid prescribing chronic opioids for pain control. We do feel that she would benefit from seeing an OBGYN as an outpatient, as her LLQ could be gynecologic in nature and could be due to her fibroid that was documented on her last abdominal US vs endometriosis.   # Cough Patient presented with dry cough and URI symptoms. On admission, she had wheezing on auscultation. Her wheezing had resolved with Duoneb treatments. CXR was negative for pneumonia. Her symptoms were most likely due to viral syndrome. We symptomatically treated her for her cough. She should continue her home asthma medications after discharge.   # Substance abuse Patient with active substance abuse. UDS+ for cocaine. We counseled her on abstaining from cocaine.    #HTN Patient is on labetalol 100 mg BID at home. We held her labetalol. She was normotensive throughout her hospitalization.    #HLD LDL 108 on 07/22/2014. We held her home Lipitor due to NPO status. We restarted her home dose of Lipitor on discharge.    #Asthma On Advair and albuterol at home. We continued her albuterol, she received scheduled Dulera. We resumed her home medications on discharge.     #Leukocytosis Patient has a h/o chronic leukocytosis of unknown etiology. WBC 15.8 on admission, baseline 11-16. Per chart review, she has been evaluated by hematology. Might be related to her anemia vs cocaine use vs possible PCOS, since pt with h/o irregular periods, obesity.   #Thrombocytosis Patient has  a h/o chronic thrombocytosis of unknown etiology. Plts at 496 on admission. Possibly 2/2/ iron defficiency anemia. Anemia panel 04/22/2014 with iron less than 10, ferritin 15, TIBC incalculable. She received IV Feraheme. She should continue her home iron supplements.   DISCHARGE DATA: Vital Signs: BP 146/76 mmHg  Pulse 91  Temp(Src) 97.8 F (36.6 C) (Oral)  Resp 16  Ht 5\' 7"  (1.702 m)  Wt 100.517 kg (221 lb 9.6 oz)  BMI 34.70 kg/m2  SpO2 99%  LMP 09/13/2014  PE General: lying in bed, intermittently coughing, in NAD.  CV: tachycardic, regular rhythm, no murmurs, rubs, or gallops Lungs: CTAB, normal respiratory effort Abdomen: + BS, soft, Mild tenderness to palpation in LLQ, no guarding  Extremities: we perfused, warm, no edema   Labs: Results for orders placed or performed during the hospital encounter of 10/13/14 (from the past 24 hour(s))  Glucose, capillary     Status: Abnormal   Collection Time: 10/14/14  4:38 PM  Result Value Ref Range   Glucose-Capillary 231 (H) 65 - 99 mg/dL  Glucose, capillary     Status: Abnormal   Collection Time: 10/14/14  9:12 PM  Result Value Ref Range   Glucose-Capillary 263 (H) 65 - 99 mg/dL  Glucose, capillary     Status: Abnormal   Collection Time: 10/15/14  7:37 AM  Result Value Ref Range   Glucose-Capillary 198 (H) 65 - 99 mg/dL  Glucose, capillary     Status: Abnormal   Collection Time: 10/15/14 11:42 AM  Result Value Ref Range   Glucose-Capillary 302 (H) 65 - 99 mg/dL   Results for AALYIAH, CAMBEROS (MRN [id number]) as of 10/15/2014 15:42  Ref. Range 10/14/2014 06:02  WBC Latest Ref Range: 4.0-10.5 K/uL 9.1  RBC Latest Ref Range: 3.87-5.11 MIL/uL 3.95  Hemoglobin Latest Ref Range: 12.0-15.0 g/dL 9.7 (L)  HCT Latest Ref Range: 36.0-46.0 % 28.4 (L)  MCV Latest Ref Range: 78.0-100.0 fL 71.9 (L)  MCH Latest Ref Range: 26.0-34.0 pg 24.6 (L)  MCHC Latest Ref Range: 30.0-36.0 g/dL 34.2  RDW Latest Ref Range: 11.5-15.5 % 15.6 (H)   Platelets Latest Ref Range: 150-400 K/uL 399   Erythrocyte Sedimentation Rate     Component Value Date/Time   ESRSEDRATE 51* 10/14/2014 0602    Signed: Ree Kida, Med Student   10/15/2014, 12:32 PM

## 2014-10-15 NOTE — Progress Notes (Signed)
Subjective:   Day of hospitalization: 2  VSS.  She was very insistent on "percocet" yesterday for her chronic abdominal pain which we d/c in the setting of chronic gastroparesis with N/V/abdominal pain.  Also, has a h/o cocaine abuse with +UDS this admission.  We offered toradol, kpad, and tylenol which she refused.  Otherwise she is tolerating a regular diet but still c/o of abdominal pain which is chronic.  We would certainly not treat her abdominal pain with opioids anyway.    I spoke with her about receiving [facility] services but apparently she has refused this in the past. She refused to walk this AM or get out of bed.     Objective:   Vital signs in last 24 hours: Filed Vitals:   10/14/14 2001 10/14/14 2108 10/15/14 0511 10/15/14 1005  BP:  136/71 140/65 146/76  Pulse:  91 81 91  Temp:  97.9 F (36.6 C) 98.7 F (37.1 C) 97.8 F (36.6 C)  TempSrc:  Oral Oral Oral  Resp:  14 16 16   Height:      Weight:  221 lb 9.6 oz (100.517 kg)    SpO2: 98% 100% 96% 99%    Weight: Filed Weights   10/13/14 0523 10/13/14 2125 10/14/14 2108  Weight: 201 lb (91.173 kg) 214 lb 11.2 oz (97.387 kg) 221 lb 9.6 oz (100.517 kg)    I/Os:  Intake/Output Summary (Last 24 hours) at 10/15/14 1107 Last data filed at 10/15/14 0852  Gross per 24 hour  Intake   1913 ml  Output    300 ml  Net   1613 ml    Physical Exam: Constitutional: Vital signs reviewed.  Patient is sitting up in bed in no acute distress and cooperative with exam.   HEENT: [place]/AT; EOMI, conjunctivae normal, no scleral icterus  Cardiovascular: RRR, no MRG Pulmonary/Chest: normal respiratory effort, no accessory muscle use, CTAB, no wheezes, rales, or rhonchi Abdominal: Obese.  Soft. +BS, NT/ND Neurological: A&O x3, CN II-XII grossly intact; non-focal exam Extremities: 2+DP b/l, no LE edema Skin: Warm, dry and intact. No rash Psych: Very flat affect, requesting the shades be shut.    Lab Results:  BMP:  Recent Labs Lab  10/13/14 1010 10/14/14 0833  NA 140 136  K 3.9 4.1  CL 111 107  CO2 22 23  GLUCOSE 99 256*  BUN 19 10  CREATININE 1.49* 1.01*  CALCIUM 8.5* 8.4*    CBC:  Recent Labs Lab 10/13/14 0052 10/13/14 0520 10/14/14 0602  WBC 15.8* 14.7* 9.1  NEUTROABS 13.1*  --  5.6  HGB 14.1 11.0* 9.7*  HCT 40.3 32.5* 28.4*  MCV 71.2* 72.2* 71.9*  PLT 496* 431* 399    Coagulation: No results for input(s): LABPROT, INR in the last 168 hours.  CBG:            Recent Labs Lab 10/14/14 0258 10/14/14 0755 10/14/14 1135 10/14/14 1638 10/14/14 2112 10/15/14 0737  GLUCAP 188* 225* 306* 231* 263* 198*           HA1C:       Recent Labs Lab 10/13/14 1010  HGBA1C 12.9*    Lipid Panel: No results for input(s): CHOL, HDL, LDLCALC, TRIG, CHOLHDL, LDLDIRECT in the last 168 hours.  LFTs:  Recent Labs Lab 10/13/14 0052  AST 21  ALT 9*  ALKPHOS 78  BILITOT 1.0  PROT 9.5*  ALBUMIN 3.9    Pancreatic Enzymes:  Recent Labs Lab 10/13/14 0052  LIPASE 19*  Lactic Acid/Procalcitonin:  Recent Labs Lab 10/13/14 0541  LATICACIDVEN 1.6    Ammonia: No results for input(s): AMMONIA in the last 168 hours.  Cardiac Enzymes:  Recent Labs Lab 10/13/14 0521  TROPONINI <0.03    EKG: EKG Interpretation  Date/Time:  Tuesday October 13 2014 00:34:40 EDT Ventricular Rate:  118 PR Interval:  112 QRS Duration: 83 QT Interval:  339 QTC Calculation: 475 R Axis:   59 Text Interpretation:  Sinus tachycardia Multiform ventricular premature complexes Confirmed by DELO  MD, DOUGLAS ([id number]) on 10/13/2014 12:44:12 AM   BNP: No results for input(s): PROBNP in the last 168 hours.  D-Dimer: No results for input(s): DDIMER in the last 168 hours.  Urinalysis:  Recent Labs Lab 10/13/14 1454  COLORURINE YELLOW  LABSPEC 1.033*  PHURINE 5.5  GLUCOSEU >1000*  HGBUR NEGATIVE  BILIRUBINUR SMALL*  KETONESUR 40*  PROTEINUR 30*  UROBILINOGEN 0.2  NITRITE NEGATIVE  LEUKOCYTESUR  NEGATIVE    Micro Results: No results found for this or any previous visit (from the past 240 hour(s)).  Blood Culture:    Component Value Date/Time   SDES URINE, RANDOM 04/22/2014 0037   SPECREQUEST NONE 04/22/2014 0037   CULT  04/22/2014 0008    NO GROWTH 5 DAYS Performed at [facility] 04/26/2014 FINAL 04/22/2014 0037    Studies/Results: No results found.  Medications:  Scheduled Meds: . insulin aspart  0-20 Units Subcutaneous TID WC  . insulin glargine  25 Units Subcutaneous QHS  . mometasone-formoterol  2 puff Inhalation BID  . sodium chloride  3 mL Intravenous Q12H   Continuous Infusions:  PRN Meds: acetaminophen **OR** acetaminophen, benzonatate, ipratropium-albuterol, LORazepam  Antibiotics: Antibiotics Given (last 72 hours)    None      Day of Hospitalization: 2  Consults:    Assessment/Plan:   Principal Problem:   DKA (diabetic ketoacidoses) Active Problems:   Polysubstance abuse   Depression   Essential hypertension   Chronic ischemic heart disease   Asthma   Iron deficiency anemia   History of leukocytosis   Chronic hepatitis B   Diabetic gastroparesis associated with type 2 diabetes mellitus   Abdominal pain, chronic, left lower quadrant  DKA (diabetic ketoacidoses) Resolved, no AG.  Stable this AM but still c/o nausea but has been tolerating a regular diet with vomiting.  Having seen this patient in the past, I suspect a lot of her symptoms are stemming from depression/anxiety.   -stable for d/c today with PCP f/u   Polysubstance abuse Pt has a h/o cocaine abuse that is ongoing.  Asthma -cont current meds  History of leukocytosis Difficult to explain but maybe d/t chronic inflammation in the setting of continued cocaine abuse.  Also may be explained by PCOS.    Thrombocytosis Likely d/t iron def anemia from chronic blood loss from uterine fibroids.  Pt reports normal periods.  Had a dose of IV feraheme  yesterday and tolerated this well. -cont ferrous sulfate once daily   Diabetic gastroparesis associated with type 2 diabetes mellitus Pt has been prescribed percocet by her PCP which we have subsequently d/c in the setting of gastroparesis.  This is certainly not indicated for chronic abdominal pain and is only exacerbating her symptoms.  It is not appropriate in the context of severe gastroparesis as this opioids will only delay gastric emptying.    Abdominal pain, chronic, left lower quadrant Pt has had multiple CT scans and other imaging for this abd pain without  any etiology.  Likely perhaps from gastroparesis and may also be coming from her uterine fibroids.  Could also be ischemia from chronic cocaine use.  I would definitely NOT re-image this 39 yo lady as the etiology for her pain has been extensively worked up in the past and re-imaging this lady will only expose her to additional radiation with potential harm and no benefit.  Also dysmenorrhea may be contributing as well.  She has been referred to gyn in  the past but failed to follow-up.   -cont reglan -encourage her to abstain from cocaine use -gastroparesis diet education -tylenol PRN, kpad  ?Somatization disorder with depression/anxiety Pt has multiple complaints of neurological, gastrointestinal, and gynecological complaints which could represent somatization disorder in addition to depression/anxiety.   -cont home meds  Iron deficiency anemia Doubt she takes iron supplementation at home. -IV feraheme  F/E/N Fluids- None  Electrolytes- Replete as needed  Nutrition- HH/Carb mod diet   VTE PPx  lovenox 40mg  SQ qd   Disposition Discharge today.  I am unsure if her symptoms will be completely gone as I imagine again that a lot of her symptoms are due to depression/anxiety which we are treating.  Also has been referred to gyn in the past but has failed to follow up.      LOS: 2 days   Jones Bales, MD PGY-2, Internal  Medicine Teaching Service 10/15/2014, 11:07 AM

## 2014-10-15 NOTE — Progress Notes (Signed)
Subjective: Patient complained of 10/10 LLQ abdominal pain yesterday. She repeatedly refused Tylenol, toradol, and Kpad, as she didn't believe that any of those would help, and was asking for Percocet instead. She reported she wanted to leave the hospital several times last night as she was frustrated that we are not treating her abdominal pain. We explained several times that we want to avoid using opiods for pain control because of her nausea and gastroparesis. She reports that she hasn't been only taking bites from her food tray, but said that she tolerated some snacks that have been brought to her much better. She has been encouraged to get out of bed and walk, but is refusing to because of the pain.     Objective: Vital signs in last 24 hours: Filed Vitals:   10/14/14 2001 10/14/14 2108 10/15/14 0511 10/15/14 1005  BP:  136/71 140/65 146/76  Pulse:  91 81 91  Temp:  97.9 F (36.6 C) 98.7 F (37.1 C) 97.8 F (36.6 C)  TempSrc:  Oral Oral Oral  Resp:  14 16 16   Height:      Weight:  100.517 kg (221 lb 9.6 oz)    SpO2: 98% 100% 96% 99%   Weight change: 3.13 kg (6 lb 14.4 oz)  Intake/Output Summary (Last 24 hours) at 10/15/14 1146 Last data filed at 10/15/14 0852  Gross per 24 hour  Intake   1793 ml  Output    300 ml  Net   1493 ml   General: lying in bed, intermittently coughing, in NAD.  CV: tachycardic, regular rhythm, no murmurs, rubs, or gallops Lungs: CTAB, normal respiratory effort Abdomen: + BS, soft, Mild tenderness to palpation in LLQ, no guarding  Extremities: we perfused, warm, no edema   Lab Results: CBG:  Recent Labs  10/14/14 0258 10/14/14 0755 10/14/14 1135 10/14/14 1638 10/14/14 2112 10/15/14 0737  GLUCAP 188* 225* 306* 231* 263* 198*    Medications: I have reviewed the patient's current medications. Scheduled Meds: . insulin aspart  0-20 Units Subcutaneous TID WC  . insulin glargine  25 Units Subcutaneous QHS  . mometasone-formoterol  2  puff Inhalation BID  . sodium chloride  3 mL Intravenous Q12H   Continuous Infusions:  PRN Meds:.acetaminophen **OR** acetaminophen, benzonatate, ipratropium-albuterol, LORazepam Assessment/Plan: Principal Problem:   DKA (diabetic ketoacidoses) Active Problems:   Polysubstance abuse   Depression   Essential hypertension   Chronic ischemic heart disease   Asthma   Iron deficiency anemia   History of leukocytosis   Chronic hepatitis B   Diabetic gastroparesis associated with type 2 diabetes mellitus   Abdominal pain, chronic, left lower quadrant   Cough  Shirley [name] is a 39 yo F with a h/o chronic abdominal pain, poorly controlled T2DM with neuropathy and gastroparesis, HTN, HLD, CAD s/p angioplasty, obesity, h/o CVA, OSA, polysubstance abuse who presented with hematemesis and was found to be in DKA with AG of 17.   # Hematemesis - patient presents with hematemesis in the setting of 4 days of N/V. Per ED note, EMS reported that the patient vomited about 300 ml dark red blood. Her hematemesis since resolved. The patient has a h/o chronic n/v of unclear etiology with multiple ED visits. She had multiple CTs over the past several years which were negative for any acute process. The current episode of hematemesis is most likely due to [employer] tear in setting of prolonged vomiting. However, it could also be cause by PUD given h/o + H  Pylori or esophageal ulcer, given h/o GERD. Pt with stable vital signs.  - Last Hgb at 9.7 , down from 11. Likely from acute blood loss on day of admission, but also due to dilutional component 2/2 IVF resuscitation  - s/p Feraheme 510mg  yesterday, tolerated well - tolerating soft diet without vomiting -phenergan 12.5 mg q6h prn for nausea - patient stable for discharge today with close PCP follow up  #DKA Resolved. AG closed x 2. On admission, patient with glucose of 362, AG 17, bicarb 19. LA 1.6. She reports her CBG at home range from 200-400. She has  not been taking her insulin for the past 2 weeks. Last HgbA1C on 06/2014 was 11.1. Poorly compliant with insulin at home.  - patient stable for discharge today with close PCP follow up  #DM Poorly controlled. Last HgbA1C on 06/2014 was 11.1. Poorly compliant with insulin at home.  - Lantus - SSI  #Diabetic gastroparesis Patient has a h/o chronic N/V and abdominal pain. She has had multiple CT scans in the past, which were negative for any acute processes that could explain her symptoms. She had a gastric emptying scan on 01/2010, which was negative on Reglan. She underwent an EGD in 2011, which showed mild gastritis. Stomach biopsy showed moderate chronic active H Pylori gastritis, which was treated at that time. Her most recent EGD in December 2015, was negative for gastritis, but showed food residue in the gastric body c/w gastroparesis. GI recommended a f/u gastric emptying scan, but the patient refused to eat the eggs or oatmeal. She has never had a colonoscopy.  - cnt home metoclopramide 10 mg q6h - phenergan 12.5 mg q6h prn for nausea  # Chronic LLQ pain Patient has a h/o chronic LLQ abdominal pain with multiple visits to the ED. As mentioned above, multiple CT scans were negative for any acute processes that could explain her pain. She has never had a colonoscopy. She had a transvaginal and transabdominal US on 05/20/2013, which showed an intramural posterior fibroid, with no evidence of ovarian pathology or hydrosalpinx. She has been previously referred to see and OBGYN, as her pain was thought to be gynecologic in nature 2/2 fibroid vs endometriosis, but she has not followed up.  - urine pregnancy test negative  - patient rates her LLQ pain as a 10/10. She has Tylenol 650 mg and Toradol available for pain. She has been requesting her home percocet that she takes for the pain. We talked to the patient and explained that opioids are a poor choice for her pain control, esp given her history of  gastroparesis and nausea/vomiting. Additionally, the patient is not a candidate for chronic opioids, given her h/o cocaine abuse. Her UDS was + for cocaine at admission.  -K pad   # Cough Patient with dry cough and URI symptoms. Chest clear to auscultation. Most likely due to viral syndrome.  -Tessalon prn for cough   # Substance abuse Patient with active substance abuse. UDS+ for cocaine.  - CSW - counsel on abstinence from cocaine   #HTN Normotensive. Home on labetalol 100 mg BID. Consider converting to Coreg at dc  - holding home labetalol  #HLD LDL 108 on 07/22/2014 - restart home Lipitor   #Asthma On Advair and albuterol at home  - cnt albuterol q4hrs for wheezing prn  - Dulera 2 puffs BID  #Leukocytosis Patient has a h/o chronic leukocytosis of unknown etiology. WBC 15.8 on admission, baseline 11-16. Per chart review, she has been  evaluated by hematology. Might be related to her anemia vs cocaine use vs possible PCOS, since pt with h/o irregular periods, obesity.   #Thrombocytosis Patient has a h/o chronic thrombocytosis of unknown etiology. Plts at 496 on admission. Possibly 2/2/ iron defficiency anemia. Anemia panel 04/22/2014 with iron less than 10, ferritin 15, TIBC incalculable.   FENGI:  - Soft, tolerating well without vomiting  - replete electrolytes as needed   DVT ppx:  -SCDs   Dispo: stable for discharge today   This is a Careers information officer Note.  The care of the patient was discussed with Dr. Gordy Levan and the assessment and plan formulated with their assistance.  Please see their attached note for official documentation of the daily encounter.   LOS: 2 days   [employer], Med Student 10/15/2014, 11:46 AM

## 2014-11-02 ENCOUNTER — Telehealth: Payer: Self-pay | Admitting: Dietician

## 2014-11-02 ENCOUNTER — Other Ambulatory Visit: Payer: Self-pay | Admitting: Internal Medicine

## 2014-11-02 NOTE — Telephone Encounter (Signed)
Patient called asking for her "sliding scale" insulin directions. Gave this to her verbally over the phone from her Novolog prescription and mailed it to her as well. Explained that it appears to only be a correction amount of insulin and does she know how to cover her food with insulin as well? She replied that she does not know how to do that. Explained to patient that most persons with diabetes on insulin also need to take rapid acting insulin when they eat. Also, explained that the amount of correction and food Novolog often approximates the amount of Lantus or basal insulin that they take. I also suggested that if her blood sugar is more than her current "slideing scale of 350mg /dL for more than 24 hours that she should call the office.   Told her this CDE is available to her as needed and asked if she wanted me to transfer her call to the front office to make an appointment to see her doctro

## 2014-11-05 ENCOUNTER — Other Ambulatory Visit: Payer: Self-pay | Admitting: Internal Medicine

## 2014-11-09 ENCOUNTER — Other Ambulatory Visit: Payer: Self-pay | Admitting: Internal Medicine

## 2014-11-11 ENCOUNTER — Inpatient Hospital Stay (HOSPITAL_COMMUNITY)
Admission: EM | Admit: 2014-11-11 | Discharge: 2014-11-15 | DRG: 074 | Disposition: A | Discharge status: Home / Self Care | Payer: Self-pay | Attending: Internal Medicine | Admitting: Internal Medicine

## 2014-11-11 ENCOUNTER — Encounter (HOSPITAL_COMMUNITY): Payer: Self-pay | Admitting: Emergency Medicine

## 2014-11-11 DIAGNOSIS — F341 Dysthymic disorder: Secondary | ICD-10-CM | POA: Diagnosis present

## 2014-11-11 DIAGNOSIS — G43A1 Cyclical vomiting, intractable: Secondary | ICD-10-CM

## 2014-11-11 DIAGNOSIS — E1143 Type 2 diabetes mellitus with diabetic autonomic (poly)neuropathy: Principal | ICD-10-CM | POA: Diagnosis present

## 2014-11-11 DIAGNOSIS — I10 Essential (primary) hypertension: Secondary | ICD-10-CM | POA: Diagnosis present

## 2014-11-11 DIAGNOSIS — R112 Nausea with vomiting, unspecified: Secondary | ICD-10-CM | POA: Diagnosis present

## 2014-11-11 DIAGNOSIS — M797 Fibromyalgia: Secondary | ICD-10-CM | POA: Diagnosis present

## 2014-11-11 DIAGNOSIS — E1142 Type 2 diabetes mellitus with diabetic polyneuropathy: Secondary | ICD-10-CM | POA: Diagnosis present

## 2014-11-11 DIAGNOSIS — E860 Dehydration: Secondary | ICD-10-CM | POA: Diagnosis present

## 2014-11-11 DIAGNOSIS — R000 Tachycardia, unspecified: Secondary | ICD-10-CM | POA: Diagnosis present

## 2014-11-11 DIAGNOSIS — Z9114 Patient's other noncompliance with medication regimen: Secondary | ICD-10-CM | POA: Diagnosis present

## 2014-11-11 DIAGNOSIS — J45909 Unspecified asthma, uncomplicated: Secondary | ICD-10-CM | POA: Diagnosis present

## 2014-11-11 DIAGNOSIS — Z8673 Personal history of transient ischemic attack (TIA), and cerebral infarction without residual deficits: Secondary | ICD-10-CM

## 2014-11-11 DIAGNOSIS — R1032 Left lower quadrant pain: Secondary | ICD-10-CM

## 2014-11-11 DIAGNOSIS — Z794 Long term (current) use of insulin: Secondary | ICD-10-CM

## 2014-11-11 DIAGNOSIS — K3184 Gastroparesis: Secondary | ICD-10-CM | POA: Diagnosis present

## 2014-11-11 DIAGNOSIS — D509 Iron deficiency anemia, unspecified: Secondary | ICD-10-CM | POA: Diagnosis present

## 2014-11-11 DIAGNOSIS — I2510 Atherosclerotic heart disease of native coronary artery without angina pectoris: Secondary | ICD-10-CM | POA: Diagnosis present

## 2014-11-11 DIAGNOSIS — Z955 Presence of coronary angioplasty implant and graft: Secondary | ICD-10-CM

## 2014-11-11 DIAGNOSIS — E1165 Type 2 diabetes mellitus with hyperglycemia: Secondary | ICD-10-CM

## 2014-11-11 DIAGNOSIS — Z87891 Personal history of nicotine dependence: Secondary | ICD-10-CM

## 2014-11-11 DIAGNOSIS — G4733 Obstructive sleep apnea (adult) (pediatric): Secondary | ICD-10-CM | POA: Diagnosis present

## 2014-11-11 DIAGNOSIS — E785 Hyperlipidemia, unspecified: Secondary | ICD-10-CM | POA: Diagnosis present

## 2014-11-11 DIAGNOSIS — I252 Old myocardial infarction: Secondary | ICD-10-CM

## 2014-11-11 DIAGNOSIS — F418 Other specified anxiety disorders: Secondary | ICD-10-CM | POA: Diagnosis present

## 2014-11-11 DIAGNOSIS — G8929 Other chronic pain: Secondary | ICD-10-CM | POA: Diagnosis present

## 2014-11-11 DIAGNOSIS — Z6832 Body mass index (BMI) 32.0-32.9, adult: Secondary | ICD-10-CM

## 2014-11-11 DIAGNOSIS — E119 Type 2 diabetes mellitus without complications: Secondary | ICD-10-CM | POA: Diagnosis present

## 2014-11-11 DIAGNOSIS — R1110 Vomiting, unspecified: Secondary | ICD-10-CM

## 2014-11-11 LAB — COMPREHENSIVE METABOLIC PANEL
ALK PHOS: 78 U/L (ref 38–126)
ALT: 9 U/L — ABNORMAL LOW (ref 14–54)
AST: 18 U/L (ref 15–41)
Albumin: 3.6 g/dL (ref 3.5–5.0)
Anion gap: 12 (ref 5–15)
BILIRUBIN TOTAL: 0.7 mg/dL (ref 0.3–1.2)
BUN: 6 mg/dL (ref 6–20)
CHLORIDE: 99 mmol/L — AB (ref 101–111)
CO2: 23 mmol/L (ref 22–32)
Calcium: 9.4 mg/dL (ref 8.9–10.3)
Creatinine, Ser: 0.89 mg/dL (ref 0.44–1.00)
GFR calc non Af Amer: >60 mL/min (ref >60)
Glucose, Bld: 247 mg/dL — ABNORMAL HIGH (ref 65–99)
POTASSIUM: 3.3 mmol/L — AB (ref 3.5–5.1)
Sodium: 134 mmol/L — ABNORMAL LOW (ref 135–145)
Total Protein: 8.4 g/dL — ABNORMAL HIGH (ref 6.5–8.1)

## 2014-11-11 LAB — CBC WITH DIFFERENTIAL/PLATELET
Basophils Absolute: 0 10*3/uL (ref 0.0–0.1)
Basophils Relative: 0 % (ref 0–1)
EOS PCT: 2 % (ref 0–5)
Eosinophils Absolute: 0.3 10*3/uL (ref 0.0–0.7)
HCT: 39.3 % (ref 36.0–46.0)
HEMOGLOBIN: 13.7 g/dL (ref 12.0–15.0)
LYMPHS ABS: 3.6 10*3/uL (ref 0.7–4.0)
Lymphocytes Relative: 21 % (ref 12–46)
MCH: 26 pg (ref 26.0–34.0)
MCHC: 34.9 g/dL (ref 30.0–36.0)
MCV: 74.7 fL — AB (ref 78.0–100.0)
Monocytes Absolute: 1.1 10*3/uL — ABNORMAL HIGH (ref 0.1–1.0)
Monocytes Relative: 6 % (ref 3–12)
Neutro Abs: 11.9 10*3/uL — ABNORMAL HIGH (ref 1.7–7.7)
Neutrophils Relative %: 71 % (ref 43–77)
PLATELETS: 352 10*3/uL (ref 150–400)
RBC: 5.26 MIL/uL — ABNORMAL HIGH (ref 3.87–5.11)
RDW: 17.7 % — AB (ref 11.5–15.5)
WBC: 16.9 10*3/uL — ABNORMAL HIGH (ref 4.0–10.5)

## 2014-11-11 LAB — RAPID URINE DRUG SCREEN, HOSP PERFORMED
Amphetamines: NOT DETECTED
Barbiturates: NOT DETECTED
Benzodiazepines: NOT DETECTED
COCAINE: POSITIVE — AB
OPIATES: POSITIVE — AB
Tetrahydrocannabinol: NOT DETECTED

## 2014-11-11 LAB — URINALYSIS, ROUTINE W REFLEX MICROSCOPIC
Bilirubin Urine: NEGATIVE
Glucose, UA: >1000 mg/dL — AB
Ketones, ur: 15 mg/dL — AB
Leukocytes, UA: NEGATIVE
Nitrite: NEGATIVE
PH: 7 (ref 5.0–8.0)
Protein, ur: NEGATIVE mg/dL
Specific Gravity, Urine: 1.015 (ref 1.005–1.030)
Urobilinogen, UA: 0.2 mg/dL (ref 0.0–1.0)

## 2014-11-11 LAB — LIPASE, BLOOD: Lipase: 20 U/L — ABNORMAL LOW (ref 22–51)

## 2014-11-11 LAB — URINE MICROSCOPIC-ADD ON

## 2014-11-11 LAB — TROPONIN I: Troponin I: <0.03 ng/mL (ref <0.031)

## 2014-11-11 LAB — CBG MONITORING, ED: Glucose-Capillary: 236 mg/dL — ABNORMAL HIGH (ref 65–99)

## 2014-11-11 LAB — GLUCOSE, CAPILLARY
GLUCOSE-CAPILLARY: 201 mg/dL — AB (ref 65–99)
Glucose-Capillary: 243 mg/dL — ABNORMAL HIGH (ref 65–99)

## 2014-11-11 LAB — I-STAT BETA HCG BLOOD, ED (MC, WL, AP ONLY): I-stat hCG, quantitative: <5 m[IU]/mL (ref <5)

## 2014-11-11 MED ORDER — ALBUTEROL SULFATE (2.5 MG/3ML) 0.083% IN NEBU: 2.5000 mg | INHALATION_SOLUTION | RESPIRATORY_TRACT | Status: DC | PRN

## 2014-11-11 MED ORDER — SODIUM CHLORIDE 0.9 % IV BOLUS (SEPSIS)
1000.0000 mL | Freq: Once | INTRAVENOUS | Status: AC
  Administered 2014-11-11: 1000 mL via INTRAVENOUS

## 2014-11-11 MED ORDER — ALBUTEROL SULFATE HFA 108 (90 BASE) MCG/ACT IN AERS: 2.0000 | INHALATION_SPRAY | RESPIRATORY_TRACT | Status: DC | PRN

## 2014-11-11 MED ORDER — INSULIN GLARGINE 100 UNIT/ML SOLOSTAR PEN: 20.0000 [IU] | PEN_INJECTOR | Freq: Every day | SUBCUTANEOUS | Status: DC

## 2014-11-11 MED ORDER — LABETALOL HCL 100 MG PO TABS: 100.0000 mg | ORAL_TABLET | Freq: Two times a day (BID) | ORAL | Status: DC

## 2014-11-11 MED ORDER — PROMETHAZINE HCL 25 MG/ML IJ SOLN
25.0000 mg | Freq: Once | INTRAMUSCULAR | Status: AC
  Administered 2014-11-11: 25 mg via INTRAVENOUS
  Filled 2014-11-11: qty 1

## 2014-11-11 MED ORDER — INSULIN ASPART 100 UNIT/ML ~~LOC~~ SOLN
0.0000 [IU] | SUBCUTANEOUS | Status: DC
  Administered 2014-11-11 (×2): 3 [IU] via SUBCUTANEOUS
  Administered 2014-11-12: 1 [IU] via SUBCUTANEOUS
  Administered 2014-11-12: 3 [IU] via SUBCUTANEOUS
  Administered 2014-11-12: 2 [IU] via SUBCUTANEOUS

## 2014-11-11 MED ORDER — METOPROLOL TARTRATE 1 MG/ML IV SOLN
5.0000 mg | Freq: Four times a day (QID) | INTRAVENOUS | Status: DC
  Administered 2014-11-11 – 2014-11-13 (×8): 5 mg via INTRAVENOUS
  Filled 2014-11-11 (×15): qty 5

## 2014-11-11 MED ORDER — ONDANSETRON HCL 4 MG/2ML IJ SOLN
4.0000 mg | Freq: Once | INTRAMUSCULAR | Status: AC
  Administered 2014-11-11: 4 mg via INTRAVENOUS
  Filled 2014-11-11: qty 2

## 2014-11-11 MED ORDER — ENOXAPARIN SODIUM 40 MG/0.4ML ~~LOC~~ SOLN
40.0000 mg | SUBCUTANEOUS | Status: DC
  Administered 2014-11-11 – 2014-11-14 (×4): 40 mg via SUBCUTANEOUS
  Filled 2014-11-11 (×7): qty 0.4

## 2014-11-11 MED ORDER — FENTANYL CITRATE (PF) 100 MCG/2ML IJ SOLN
100.0000 ug | Freq: Once | INTRAMUSCULAR | Status: AC
  Administered 2014-11-11: 100 ug via INTRAVENOUS
  Filled 2014-11-11: qty 2

## 2014-11-11 MED ORDER — CETYLPYRIDINIUM CHLORIDE 0.05 % MT LIQD: 7.0000 mL | Freq: Two times a day (BID) | OROMUCOSAL | Status: DC

## 2014-11-11 MED ORDER — PROMETHAZINE HCL 25 MG/ML IJ SOLN
25.0000 mg | Freq: Three times a day (TID) | INTRAMUSCULAR | Status: DC | PRN
  Administered 2014-11-12: 25 mg via INTRAVENOUS
  Filled 2014-11-11: qty 1

## 2014-11-11 MED ORDER — MORPHINE SULFATE 2 MG/ML IJ SOLN: 1.0000 mg | INTRAMUSCULAR | Status: DC | PRN

## 2014-11-11 MED ORDER — INSULIN GLARGINE 100 UNIT/ML ~~LOC~~ SOLN
20.0000 [IU] | Freq: Every day | SUBCUTANEOUS | Status: DC
Start: 2014-11-11 — End: 2014-11-15
  Administered 2014-11-11 – 2014-11-14 (×4): 20 [IU] via SUBCUTANEOUS
  Filled 2014-11-11 (×5): qty 0.2

## 2014-11-11 MED ORDER — SODIUM CHLORIDE 0.9 % IJ SOLN
3.0000 mL | Freq: Two times a day (BID) | INTRAMUSCULAR | Status: DC
  Administered 2014-11-11 – 2014-11-12 (×2): 10 mL via INTRAVENOUS
  Administered 2014-11-13 – 2014-11-15 (×4): 3 mL via INTRAVENOUS

## 2014-11-11 MED ORDER — MORPHINE SULFATE 2 MG/ML IJ SOLN
2.0000 mg | Freq: Once | INTRAMUSCULAR | Status: AC
Start: 2014-11-11 — End: 2014-11-11
  Administered 2014-11-11: 2 mg via INTRAVENOUS
  Filled 2014-11-11: qty 1

## 2014-11-11 MED ORDER — CHLORHEXIDINE GLUCONATE 0.12 % MT SOLN
15.0000 mL | Freq: Two times a day (BID) | OROMUCOSAL | Status: DC
  Administered 2014-11-11 – 2014-11-15 (×6): 15 mL via OROMUCOSAL
  Filled 2014-11-11 (×11): qty 15

## 2014-11-11 MED ORDER — METOCLOPRAMIDE HCL 5 MG/ML IJ SOLN
10.0000 mg | Freq: Three times a day (TID) | INTRAMUSCULAR | Status: DC
  Administered 2014-11-11 – 2014-11-13 (×6): 10 mg via INTRAVENOUS
  Filled 2014-11-11 (×12): qty 2

## 2014-11-11 MED ORDER — SODIUM CHLORIDE 0.9 % IV BOLUS (SEPSIS)
1000.0000 mL | Freq: Once | INTRAVENOUS | Status: AC
Start: 2014-11-11 — End: 2014-11-11
  Administered 2014-11-11: 1000 mL via INTRAVENOUS

## 2014-11-11 MED ORDER — METOCLOPRAMIDE HCL 5 MG/ML IJ SOLN
10.0000 mg | Freq: Once | INTRAMUSCULAR | Status: AC
  Administered 2014-11-11: 10 mg via INTRAVENOUS
  Filled 2014-11-11: qty 2

## 2014-11-11 MED ORDER — MOMETASONE FURO-FORMOTEROL FUM 100-5 MCG/ACT IN AERO
2.0000 | INHALATION_SPRAY | Freq: Two times a day (BID) | RESPIRATORY_TRACT | Status: DC
  Administered 2014-11-11 – 2014-11-15 (×4): 2 via RESPIRATORY_TRACT
  Filled 2014-11-11: qty 8.8

## 2014-11-11 MED ORDER — POTASSIUM CHLORIDE IN NACL 40-0.9 MEQ/L-% IV SOLN
INTRAVENOUS | Status: DC
  Administered 2014-11-11 – 2014-11-12 (×2): 125 mL/h via INTRAVENOUS
  Filled 2014-11-11 (×4): qty 1000

## 2014-11-11 MED ORDER — PANTOPRAZOLE SODIUM 40 MG IV SOLR
40.0000 mg | INTRAVENOUS | Status: DC
  Administered 2014-11-11 – 2014-11-12 (×2): 40 mg via INTRAVENOUS
  Filled 2014-11-11 (×5): qty 40

## 2014-11-11 MED ORDER — MORPHINE SULFATE 2 MG/ML IJ SOLN
1.0000 mg | INTRAMUSCULAR | Status: DC | PRN
  Administered 2014-11-11 – 2014-11-12 (×2): 1 mg via INTRAVENOUS
  Filled 2014-11-11 (×2): qty 1

## 2014-11-11 NOTE — ED Notes (Signed)
Patient presents to ED with reports of severe left lower quadrant abdominal pain, nausea, and vomiting x 2 days. Pain 10/10.

## 2014-11-11 NOTE — ED Notes (Signed)
MD at bedside. 

## 2014-11-11 NOTE — H&P (Signed)
Date: 11/11/2014               Patient Name:  Shirley [name] MRN: [id number]  DOB: 01/16/1976 Age / Sex: 39 y.o., female   PCP: Collier Salina, MD         Medical Service: Internal Medicine Teaching Service         Attending Physician: Dr. Bartholomew Crews, MD    First Contact: Erlene Senters. Grandville Silos Pager: [phone]  Second Contact: Dr. Denton Brick Pager: [phone]       After Hours (After 5p/  First Contact Pager: [phone]  weekends / holidays): Second Contact Pager: [phone]   Chief Complaint: Vomiting and Abdominal Pain.  History of Present Illness: 85 Y O F with PMH of- Uncontrolled DM with hx of gastroparesis, Chronic left abdominal pain, HTN, Substance abuse, HLD, Coronary artery disease, depression, Asthma, OSA, chronic hep B. Presented today with c/o Vomiting and Nausea, and severe abdominal pain that all started suddenly on Monday. She describes suddenly became sweaty and then vomiting. She has had several episodes of vomiting since this started. Initially non bloody, but later with some small amount of blood. Also endorses some Shortness of breath when episode began. Abdominal pain also started about the same time, located in her left lower quadrant, non radiating and in the same area where she usually has pain when she has her gastroparesis flare. Her last bowel movement was yesterday, normal formed stool. No diarrhea.  She denies fever or chills. She denies chest pain or palapitations. She denies burning with urination, but endorses frequency.  She has not been taking some of her medications- Insulins for 2 days, and the other PO meds- for about 10 days. She says her last Cocaine use was about 10 days ago. She has some opioids- hydrocodone at home- she has taken them intermittently.  In the ED she was given 2L of IVF, fentanyl, reglan, phenergan and Zofran without any relief of her symptoms.    Meds: No current facility-administered medications for this encounter.     Allergies: Allergies as of 11/11/2014 - Review Complete 11/11/2014  Allergen Reaction Noted  . Lisinopril Nausea Only 03/17/2008  . Morphine and related Itching 04/15/2013   Past Medical History  Diagnosis Date  . Thrombocytosis     Hem/Onc suggested 2/2 chronic hepatits and/or iron deficiency anemia  . Iron deficiency anemia   . N&V (nausea and vomiting)     Chronic. Unclear etiology with multiple admission and ED visits. CT abdomen with and without contrast (02/2011)  showed no acute process. Gastic Emptying scan (01/2010) was normal. Ultrasound of the abdomen was within normal limits. Hepatitis B viral load was undectable. HIV NR. EGD - gastritis, Hpylori + s/p Rx  . Hypertension   . Hyperlipidemia   . Diabetes mellitus type 2, uncontrolled, with complications   . Recurrent boils   . 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 Cath 12/10- no restenosis or significant CAD progression  . Irregular menses     Small ovarian follicles seen on SW(9/67)  . History of pyelonephritis     H/o GrpB Pyelonephritis (9/06) and UTI- 07/11- E.Coli, 12/10- GBS  . Abscess of tunica vaginalis     10/09- Abundant S. aureus- sensitive to all abx  . Obesity   . Gastritis   . Depression   . Peripheral neuropathy   . Fibromyalgia   .  GERD (gastroesophageal reflux disease)   . Anxiety   . Gastroparesis     secondary to poorly controlled DM, last emptying study performed 01/2010  was normal but may be falsely positive as pt was on reglan  . Blood dyscrasia   . Substance abuse   . MI (myocardial infarction) 2008  . OSA (obstructive sleep apnea)     "suppose to wear mask but I don't" (04/22/2014)  . Seasonal asthma   . Hepatitis B, chronic     Hep BeAb+,Hep B cAb+ & Hep BsAg+ (9/06)  . Migraine     "weekly" (04/22/2014)  . CVA (cerebral vascular accident) ~ 02/2014    denies  residual on 04/22/2014  . CVA (cerebral vascular accident)     history of remote right cerebellar infarct noted on head CT at least since 10/2011  . Pneumonia     "this is probably the 2nd or 3rd time I've had pneumonia" (04/22/2014)   Past Surgical History  Procedure Laterality Date  . Cesarean section  1997  . Coronary angioplasty with stent placement  2008    "2 stents"  . Esophagogastroduodenoscopy N/A 04/23/2014    Procedure: ESOPHAGOGASTRODUODENOSCOPY (EGD);  Surgeon: Winfield Cunas., MD;  Location: [facility] ENDOSCOPY;  Service: Endoscopy;  Laterality: N/A;   Family History  Problem Relation Age of Onset  . Diabetes Father    History   Social History  . Marital Status: Single    Spouse Name: N/A  . Number of Children: 2  . Years of Education: 11   Occupational History  . other     unemployed   Social History Main Topics  . Smoking status: Former Smoker    Types: Cigarettes    Quit date: 04/24/1996  . Smokeless tobacco: Never Used     Comment: quit smoking cigarettes age 53  . Alcohol Use: Yes     Comment: 04/22/2014 "might have a few drinks a month"  . Drug Use: No     Comment: 04/22/2104 "quit drugs ~ 1-2 yr ago"  . Sexual Activity: Yes   Other Topics Concern  . 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   Also works at [employer] having to lift elderly individuals.          Review of Systems: CONSTITUTIONAL- No Fever. SKIN- No Rash, or itching. HEAD- No Headache or dizziness. Mouth/throat- No Sorethroat RESPIRATORY- HAs a chronic cough, productive of yellow sputum, but no SOB. NEUROLOGIC- No  seizures.  Physical Exam: Blood pressure 165/104, pulse 116, temperature 98.9 F (37.2 C), temperature source Oral, resp. rate 22, last menstrual period 10/16/2014, SpO2 99 %. General appearance: alert, cooperative, appears stated age and mild-mod distress, restless in the  bed Head: Normocephalic, without obvious abnormality, atraumatic, dry mucus membranes Lungs: clear to auscultation bilaterally Heart: Tachycardic, but regular rate and rhythm, S1, S2 normal, no murmurs. Abdomen: soft, tenderness left lower quadrant without rebound or guarding; reduced bowel sounds , no masses,  no organomegaly Extremities: extremities normal, atraumatic, no cyanosis or edema Skin: Skin color, texture, turgor normal. Has scratch marks on her back.   Lab results: Basic Metabolic Panel:  Recent Labs  11/11/14 0650  NA 134*  K 3.3*  CL 99*  CO2 23  GLUCOSE 247*  BUN 6  CREATININE 0.89  CALCIUM 9.4   Liver Function Tests:  Recent Labs  11/11/14 0650  AST 18  ALT 9*  ALKPHOS 78  BILITOT 0.7  PROT 8.4*  ALBUMIN 3.6    Recent Labs  11/11/14 0650  LIPASE 20*   CBC:  Recent Labs  11/11/14 0758  WBC 16.9*  NEUTROABS 11.9*  HGB 13.7  HCT 39.3  MCV 74.7*  PLT 352   CBG:  Recent Labs  11/11/14 0717  GLUCAP 236*   Urine Drug Screen: Drugs of Abuse     Component Value Date/Time   LABOPIA POSITIVE* 10/13/2014 1455   LABOPIA NEGATIVE 02/21/2010 2201   COCAINSCRNUR POSITIVE* 10/13/2014 [place] 02/21/2010 2201   LABBENZ NONE DETECTED 10/13/2014 1455   LABBENZ NEGATIVE 02/21/2010 2201   AMPHETMU NONE DETECTED 10/13/2014 1455   AMPHETMU NEGATIVE 02/21/2010 2201   THCU NONE DETECTED 10/13/2014 1455   LABBARB NONE DETECTED 10/13/2014 1455    Other results: EKG: Pending.  Assessment & Plan by Problem:  Intractable Nausea and vomiting with Abdominal Pain- Considering hx of uncontrolled diabetes, pain consistent with prior gastroparesis flare, non compliance with pro-motility agents reglan for ~2 weeks, Pain med and substance abuse, will manage as a gastroparesis flare for now. Has had a gastric emptying study done- 2011, which was negative but patient was on reglan, EGD- 2015- suggested Gastroparesis. Lab work revealed- Low  K- consistent with poor PO intake and Vomiting, and elevated WBC- pt has a baseline leukocytosis that has been attributed to chronic hep B- which is unlikely, as noted by hematologist note last admission. Other differential- Diverticulitis- but no fever, no colonoscopy on file, last CT scan- 2015. Low suspicion for ovarian pathology likely torsion considering no hx of ovarian mass.  - Admit to tele- d/c tomorrow. - NPO, possibly start clears in tomorrow. - IVF- n/s + 40 KCL at 150cc/hr - Morphine for pain, though this will worsen gastroparesis, pt is in severe pain, options for pain control limited. 1mg  Q4H. - Hx of diaphoresis, vomiting, hx of uncontrolled diabetes in a female and CAD- will check one troponin- trop x1 - EKG - IV Protonix- 40mg  daily - IV Reglan- 10mg  TID - Gastric emptying study when stable or as outpt- will need to be off reglan and opioids. - UDS - UA- frequency and lower abdominal pain.  Poorly controlled DM- HGbA1c- 12.9- 4 weeks ago. Chronically uncontrolled, lowest Hgba1c- 7.3 in 2008, and since then mostly 9-10. Home meds- Lantus 50u daily with SSI. -SSI- Q4H - Lantus- 20u daily  Tachycardia- up to 140s. Likely due to acute illness- vomiting and severe abdominal pain. - EKG - Hydrate  HTN- BP elevated. Resume home meds- Labetalol- 100mg  BID, consider switching to Coreg considering hx of CAD. - Since NPO, switch to IV metoprolol 5mg  Q6H.  FEN- K- 3.3 - Replace in fluids.  DVT ppx- Lovenox.  Dispo: Disposition is deferred at this time, awaiting improvement of current medical problems. Anticipated discharge in approximately 1-2 day(s).   The patient does have a current PCP Collier Salina, MD) and does need an [facility] hospital follow-up appointment after discharge.  The patient does not know have transportation limitations that hinder transportation to clinic appointments.  Signed: Bethena Roys, MD 11/11/2014, 4:00 PM

## 2014-11-11 NOTE — Progress Notes (Signed)
Shirley [name] [id number] Admission Data: 11/11/2014 4:36 PM Attending Provider: Bartholomew Crews, MD  EVO:[id number] Cassie Freer, MD Consults/ Treatment Team:    Shirley [name] is a 39 y.o. female patient admitted from ED awake, alert  & orientated  X 3,  Prior, VSS - Blood pressure 182/109, pulse 119, temperature 99.2 F (37.3 C), temperature source Oral, resp. rate 18, last menstrual period 10/16/2014, SpO2 100 %., no c/o shortness of breath, no c/o chest pain, no distress noted. .   IV site WDL:  forearm left, condition patent and no redness with a transparent dsg that's clean dry and intact.  Allergies:   Allergies  Allergen Reactions  . Lisinopril Nausea Only  . Morphine And Related Itching     Past Medical History  Diagnosis Date  . Thrombocytosis     Hem/Onc suggested 2/2 chronic hepatits and/or iron deficiency anemia  . Iron deficiency anemia   . N&V (nausea and vomiting)     Chronic. Unclear etiology with multiple admission and ED visits. CT abdomen with and without contrast (02/2011)  showed no acute process. Gastic Emptying scan (01/2010) was normal. Ultrasound of the abdomen was within normal limits. Hepatitis B viral load was undectable. HIV NR. EGD - gastritis, Hpylori + s/p Rx  . Hypertension   . Hyperlipidemia   . Diabetes mellitus type 2, uncontrolled, with complications   . Recurrent boils   . 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 Cath 12/10- no restenosis or significant CAD progression  . Irregular menses     Small ovarian follicles seen on FG(1/82)  . History of pyelonephritis     H/o GrpB Pyelonephritis (9/06) and UTI- 07/11- E.Coli, 12/10- GBS  . Abscess of tunica vaginalis     10/09- Abundant S. aureus- sensitive to all abx  . Obesity   . Gastritis   . Depression   . Peripheral neuropathy   . Fibromyalgia   . GERD (gastroesophageal reflux  disease)   . Anxiety   . Gastroparesis     secondary to poorly controlled DM, last emptying study performed 01/2010  was normal but may be falsely positive as pt was on reglan  . Blood dyscrasia   . Substance abuse   . MI (myocardial infarction) 2008  . OSA (obstructive sleep apnea)     "suppose to wear mask but I don't" (04/22/2014)  . Seasonal asthma   . Hepatitis B, chronic     Hep BeAb+,Hep B cAb+ & Hep BsAg+ (9/06)  . Migraine     "weekly" (04/22/2014)  . CVA (cerebral vascular accident) ~ 02/2014    denies residual on 04/22/2014  . CVA (cerebral vascular accident)     history of remote right cerebellar infarct noted on head CT at least since 10/2011  . Pneumonia     "this is probably the 2nd or 3rd time I've had pneumonia" (04/22/2014)    History:  obtained from the patient.  Pt orientation to unit, room and routine. Information packet given to patient/family and safety video watched.  Admission INP armband ID verified with patient/family, and in place. SR up x 2, fall risk assessment complete with Patient and family verbalizing understanding of risks associated with falls. Pt verbalizes an understanding of how to use the call bell and to call for help before getting out of bed.  Skin, clean-dry- intact without evidence of bruising,  or skin tears.   No evidence of skin break down noted on exam. no rashes, no ecchymoses, no petechiae    Will cont to monitor and assist as needed.  Tico Crotteau Margaretha Sheffield, RN 11/11/2014 4:36 PM

## 2014-11-11 NOTE — ED Notes (Signed)
Admitting phy at bedside; pt did not tolerate PO and having vomiting again

## 2014-11-11 NOTE — ED Notes (Signed)
Pt given water for fluid challenge 

## 2014-11-11 NOTE — ED Provider Notes (Signed)
CSN: [id number]     Arrival date & time 11/11/14  0620 History   First MD Initiated Contact with Patient 11/11/14 [phone]     Chief Complaint  Patient presents with  . Abdominal Pain  . Emesis     (Consider location/radiation/quality/duration/timing/severity/associated sxs/prior Treatment) HPI Comments: Patient with history of diabetic gastroparesis and insulin non-compliance, substance abuse -- presents with c/o nausea, vomiting, LLQ abdominal pain consistent with her previous episodes of gastroparesis. She is unable to keep down medications including her chronic pain medication. No fever, diarrhea, urinary sx. No treatments PTA. Patient states she has not been able to get her insulin in the past week. The onset of this condition was acute. The course is constant. Aggravating factors: none. Alleviating factors: none.    The history is provided by the patient and medical records.    Past Medical History  Diagnosis Date  . Thrombocytosis     Hem/Onc suggested 2/2 chronic hepatits and/or iron deficiency anemia  . Iron deficiency anemia   . N&V (nausea and vomiting)     Chronic. Unclear etiology with multiple admission and ED visits. CT abdomen with and without contrast (02/2011)  showed no acute process. Gastic Emptying scan (01/2010) was normal. Ultrasound of the abdomen was within normal limits. Hepatitis B viral load was undectable. HIV NR. EGD - gastritis, Hpylori + s/p Rx  . Hypertension   . Hyperlipidemia   . Diabetes mellitus type 2, uncontrolled, with complications   . Recurrent boils   . 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 Cath 12/10- no restenosis or significant CAD progression  . Irregular menses     Small ovarian follicles seen on AC(1/66)  . History of pyelonephritis     H/o GrpB Pyelonephritis (9/06) and UTI- 07/11- E.Coli, 12/10- GBS  . Abscess of tunica  vaginalis     10/09- Abundant S. aureus- sensitive to all abx  . Obesity   . Gastritis   . Depression   . Peripheral neuropathy   . Fibromyalgia   . GERD (gastroesophageal reflux disease)   . Anxiety   . Gastroparesis     secondary to poorly controlled DM, last emptying study performed 01/2010  was normal but may be falsely positive as pt was on reglan  . Blood dyscrasia   . Substance abuse   . MI (myocardial infarction) 2008  . OSA (obstructive sleep apnea)     "suppose to wear mask but I don't" (04/22/2014)  . Seasonal asthma   . Hepatitis B, chronic     Hep BeAb+,Hep B cAb+ & Hep BsAg+ (9/06)  . Migraine     "weekly" (04/22/2014)  . CVA (cerebral vascular accident) ~ 02/2014    denies residual on 04/22/2014  . CVA (cerebral vascular accident)     history of remote right cerebellar infarct noted on head CT at least since 10/2011  . Pneumonia     "this is probably the 2nd or 3rd time I've had pneumonia" (04/22/2014)   Past Surgical History  Procedure Laterality Date  . Cesarean section  1997  . Coronary angioplasty with stent placement  2008    "2 stents"  . Esophagogastroduodenoscopy N/A 04/23/2014    Procedure: ESOPHAGOGASTRODUODENOSCOPY (EGD);  Surgeon: Winfield Cunas., MD;  [facility] ENDOSCOPY;  Service: Endoscopy;  Laterality: N/A;   Family History  Problem Relation Age of Onset  . Diabetes  Father    History  Substance Use Topics  . Smoking status: Former Smoker    Types: Cigarettes    Quit date: 04/24/1996  . Smokeless tobacco: Never Used     Comment: quit smoking cigarettes age 7  . Alcohol Use: Yes     Comment: 04/22/2014 "might have a few drinks a month"   OB History    No data available     Review of Systems  Constitutional: Negative for fever.  HENT: Negative for rhinorrhea and sore throat.   Eyes: Negative for redness.  Respiratory: Negative for cough.   Cardiovascular: Negative for chest pain.  Gastrointestinal: Positive for nausea,  vomiting and abdominal pain. Negative for diarrhea.  Genitourinary: Negative for dysuria.  Musculoskeletal: Negative for myalgias.  Skin: Negative for rash.  Neurological: Negative for headaches.      Allergies  Lisinopril and Morphine and related  Home Medications   Prior to Admission medications   Medication Sig Start Date End Date Taking? Authorizing Provider  ADVAIR DISKUS 100-50 MCG/DOSE AEPB Inhale 1 puff into the lungs 2 (two) times daily. 09/17/14   Luan Moore, MD  albuterol (PROVENTIL HFA;VENTOLIN HFA) 108 (90 BASE) MCG/ACT inhaler Inhale 2 puffs into the lungs every 4 (four) hours as needed for wheezing or shortness of breath. 04/26/14   Alexa Sherral Hammers, MD  atorvastatin (LIPITOR) 80 MG tablet Take 1 tablet (80 mg total) by mouth daily at 6 PM. 07/25/13   Dixon Boos, MD  BD PEN NEEDLE NANO U/F 32G X 4 MM MISC USE AS DIRECTED 07/31/14   Luan Moore, MD  benzonatate (TESSALON) 100 MG capsule Take 1 capsule (100 mg total) by mouth 2 (two) times daily as needed for cough. 10/15/14   Jones Bales, MD  dicyclomine (BENTYL) 20 MG tablet Take 1 tablet (20 mg total) by mouth 2 (two) times daily. Patient not taking: Reported on 10/13/2014 12/31/13   [name], MD  ferrous sulfate 325 (65 FE) MG tablet Take 1 tablet (325 mg total) by mouth daily with breakfast. 10/15/14   Jones Bales, MD  gabapentin (NEURONTIN) 300 MG capsule Take 300 mg by mouth 3 (three) times daily.     Historical Provider, MD  insulin aspart (NOVOLOG) 100 UNIT/ML injection Sliding scale- If sugar 150-199: inject 2 unit;  200-249: 4 units, 250-299: 7 units; 300-349: 10 units; Over 350: 12 units PENS PLEASE 05/16/14   Luan Moore, MD  labetalol (NORMODYNE) 100 MG tablet Take 1 tablet (100 mg total) by mouth 2 (two) times daily. Patient not taking: Reported on 10/13/2014 03/05/14   Oval Linsey, MD  LANTUS SOLOSTAR 100 UNIT/ML Solostar Pen Inject 50 Units into the skin daily at 10 pm. 09/16/14   Luan Moore, MD  LORazepam (ATIVAN) 1 MG tablet Take 1 mg by mouth every 8 (eight) hours as needed for anxiety.    Historical Provider, MD  metoCLOPramide (REGLAN) 10 MG tablet Take 1 tablet (10 mg total) by mouth every 6 (six) hours. 11/10/14   Sid Falcon, MD  omeprazole (PRILOSEC) 40 MG capsule Take 1 capsule (40 mg total) by mouth daily. 11/10/14   Sid Falcon, MD  promethazine (PHENERGAN) 25 MG tablet Take 1 tablet (25 mg total) by mouth every 6 (six) hours as needed for nausea or vomiting. Patient not taking: Reported on 10/13/2014 07/25/14   [name], MD   BP 157/103 mmHg  Pulse 114  Temp(Src) 98.7 F (37.1 C) (Oral)  Resp 26  SpO2 100%  LMP 10/16/2014   Physical Exam  Constitutional: She appears well-developed and well-nourished. She appears distressed.  Patient is shaking, rocking back and forth, actively wretching.   HENT:  Head: Normocephalic and atraumatic.  Eyes: Conjunctivae are normal. Right eye exhibits no discharge. Left eye exhibits no discharge.  Neck: Normal range of motion. Neck supple.  Cardiovascular: Regular rhythm and normal heart sounds.  Tachycardia present.   Pulmonary/Chest: Effort normal and breath sounds normal. Tachypnea (shallow, no Kussmaul resps) noted.  Abdominal: Soft. She exhibits no distension. There is tenderness. There is no rebound and no guarding.  Mild, LLQ, abd is soft  Neurological: She is alert.  Skin: Skin is warm and dry.  Psychiatric: She has a normal mood and affect.  Vitals reviewed.   ED Course  Procedures (including critical care time) Labs Review Labs Reviewed  COMPREHENSIVE METABOLIC PANEL - Abnormal; Notable for the following:    Sodium 134 (*)    Potassium 3.3 (*)    Chloride 99 (*)    Glucose, Bld 247 (*)    Total Protein 8.4 (*)    ALT 9 (*)    All other components within normal limits  LIPASE, BLOOD - Abnormal; Notable for the following:    Lipase 20 (*)    All other components within normal limits  CBC WITH  DIFFERENTIAL/PLATELET - Abnormal; Notable for the following:    WBC 16.9 (*)    RBC 5.26 (*)    MCV 74.7 (*)    RDW 17.7 (*)    Neutro Abs 11.9 (*)    Monocytes Absolute 1.1 (*)    All other components within normal limits  CBG MONITORING, ED - Abnormal; Notable for the following:    Glucose-Capillary 236 (*)    All other components within normal limits  CBC WITH DIFFERENTIAL/PLATELET  URINALYSIS, ROUTINE W REFLEX MICROSCOPIC (NOT AT [facility])  URINE RAPID DRUG SCREEN, HOSP PERFORMED  I-STAT BETA HCG BLOOD, ED ([facility], [facility], [facility] ONLY)    Imaging Review No results found.   EKG Interpretation None      6:40 AM Patient seen. She is actively wretching. Work-up initiated. Medications ordered. I have ordered 163mcg fentanyl given chronic pain medication use. Unclear if any of her sx are exacerbated by withdrawal.   Vital signs reviewed and are as follows: BP 157/103 mmHg  Pulse 114  Temp(Src) 98.7 F (37.1 C) (Oral)  Resp 26  SpO2 100%  LMP 10/16/2014  2:08 PM Patient has received several rounds of antiemetics including reglan, zofran, phenergan. She had some relief with phenergan and was able to tolerate a few sips of ginger ale, but soon after began vomiting again. At this point will ask for admission from [facility], her PCP.   Spoke with Dr. Denton Brick who will see in the ED.     MDM   Final diagnoses:  Intractable vomiting with nausea, vomiting of unspecified type   Admit.    Carlisle Cater, PA-C 11/11/14 [place], MD 11/12/14 [phone]

## 2014-11-11 NOTE — H&P (Signed)
Date: 11/11/2014               Patient Name:  Shirley [name] MRN: [id number]  DOB: Jun 27, 1975 Age / Sex: 39 y.o., female   PCP: Shirley Salina, MD              Medical Service: Internal Medicine Teaching Service              Attending Physician: Dr. Bartholomew Crews, MD    First Contact: Shirley [name], MS4 Pager: [phone]  Second Contact: Dr. Jenetta [name] Pager: [phone]       After Hours (After 5p/  First Contact Pager: [phone]  weekends / holidays): Second Contact Pager: [phone]   Chief Complaint:   History of Present Illness: Shirley [name] is a 39 y.o. female with 39 year old woman with insulin-dependent diabetes and associated gastroparesis , HTN, HLD, CAD s/p angioplasty (2008), obesity, hx of CVA, OSA, polysubstance abuse (cocaine, THC) who presents to [facility] with severe left lower quadrant abdominal pain, nausea, and vomiting over the past 2 days.  Patient states that her vomiting started on Monday at 12 pm. She states that she has vomited several times a day since Monday with increasing frequency. She states that prior to vomiting she feels short of breath, diaphoretic and lightheaded. She hasn't eaten since Monday afternoon. She hasn't been able to keep fluids down. She endorses pink color in emesis. She also endorses a 10/10, squeezing, sharp LLQ abdominal pain. This pain is more severe than she has had in past with prior episodes of gastroparesis. She states that it is in the same location as with prior episodes. She states that she tried to take a Phenergan tablet on Monday but could not keep it down.  Patient last took Prilosec on Monday when she ran out.  She states that she last took Lantus 50U on Monday when she ran out; she does not have a supply of Novolog at home. She states that she takes Ativan 1mg  one time per day at bedtime and still has a supply at home.  She has not been measuring her blood sugars at home.  Patient states that she has  not taken any of her other medications for the past 7 days because she ran out of all them. She states that she cannot afford her medications. Patient additionally also endorses chronic productive cough with yellow sputum that is unchanged from her baseline. She denies fever and chills. She denies dark stools, hematochezia, diarhhea, constipation, or other change in stool. Her last stool was yesterday and was normal. She states that she has had increased urinary frequency since Monday, but denies pain or burning with urination. She denies back or flank pain.  She denies chest pain, palpitations, radiating arm pain, SOB. She states that she has 6 tablets of hydrocodone at home that she hasn't been taking. She states that the last time she used cocaine was 10 days ago. She denies recent marijuana use. She denies tobacco use. She states that she last drank an alcoholic beverage 6 weeks ago.   In ED, patient received 2 x 1L NS, 1 x Reglan 10mg   IV, 2 x 25mg  Phenergan IV, 1 x Zofran 4mg  IV, 2 x fentanyl 172mcg  IV, and 1 x morphine 2mg  IV.  Notable past medical history per chart review:   Patient has a h/o chronic N/V and abdominal pain with multiple ED visits with most recent hospitalization from 10/13/14 to 10/15/14 for hematemesis and  associated nausea.  She has had multiple CT scans in the past, which were negative for any acute processes that could explain her symptoms. She had a gastric emptying scan on 01/2010, which was negative (patient was on Reglan at the time). She underwent an EGD in 2011, which showed mild gastritis. Stomach biopsy showed moderate chronic active H Pylori gastritis, which was treated at that time. Her most recent EGD in December 2015, was negative for gastritis, but showed food residue in the gastric body c/w gastroparesis. GI recommended a f/u gastric emptying scan, but the patient refused to eat the eggs or oatmeal.    Additionally, patient has a history of chronic LLQ abdominal pain  with multiple past CT scans per above. She has never had a colonoscopy. She had a transvaginal and transabdominal US on 05/20/2013, which showed an intramural posterior fibroid, with no evidence of ovarian pathology or hydrosalpinx. She has been previously referred to see an OBGYN, as her pain was thought to be gynecologic in nature 2/2 fibroid vs endometriosis, but she has not followed up.   Finally, patient has previously been referred to [facility] who have made multiple attempts to engage patient which she has refused.  Meds:  Current Outpatient Prescriptions  Med Current Facility-Administered Medications  Medication Dose Route Frequency Provider Last Rate Last Dose  . 0.9 % NaCl with KCl 40 mEq / L  infusion   Intravenous Continuous Shirley E Emokpae, MD      . albuterol (PROVENTIL) (2.5 MG/3ML) 0.083% nebulizer solution 2.5 mg  2.5 mg Inhalation Q4H PRN Shirley Arlyce Dice, MD      . Derrill Memo ON 11/12/2014] antiseptic oral rinse (CPC / CETYLPYRIDINIUM CHLORIDE 0.05%) solution 7 mL  7 mL Mouth Rinse q12n4p Shirley Crews, MD      . chlorhexidine (PERIDEX) 0.12 % solution 15 mL  15 mL Mouth Rinse BID Shirley Crews, MD      . enoxaparin (LOVENOX) injection 40 mg  40 mg Subcutaneous Q24H Shirley E Denton Brick, MD   40 mg at 11/11/14 1734  . insulin aspart (novoLOG) injection 0-9 Units  0-9 Units Subcutaneous 6 times per day Shirley Roys, MD   3 Units at 11/11/14 1732  . insulin glargine (LANTUS) injection 20 Units  20 Units Subcutaneous QHS Shirley Crews, MD      . metoCLOPramide (REGLAN) injection 10 mg  10 mg Intravenous 3 times per day Shirley Roys, MD   10 mg at 11/11/14 1734  . metoprolol (LOPRESSOR) injection 5 mg  5 mg Intravenous 4 times per day Shirley Roys, MD   5 mg at 11/11/14 1805  . mometasone-formoterol (DULERA) 100-5 MCG/ACT inhaler 2 puff  2 puff Inhalation BID Shirley E Emokpae, MD      . morphine 2 MG/ML injection 1 mg  1 mg Intravenous  Q4H PRN Shirley E Emokpae, MD      . pantoprazole (PROTONIX) injection 40 mg  40 mg Intravenous Q24H Shirley E Denton Brick, MD   40 mg at 11/11/14 1734  . sodium chloride 0.9 % injection 3 mL  3 mL Intravenous Q12H Shirley Arlyce Dice, MD      ication Sig Dispense Refill  . ADVAIR DISKUS 100-50 MCG/DOSE AEPB Inhale 1 puff into the lungs 2 (two) times daily. 60 each 2  . albuterol (PROVENTIL HFA;VENTOLIN HFA) 108 (90 BASE) MCG/ACT inhaler Inhale 2 puffs into the lungs every 4 (four) hours as needed for wheezing or shortness of breath. 1 Inhaler 4  .  atorvastatin (LIPITOR) 80 MG tablet Take 1 tablet (80 mg total) by mouth daily at 6 PM. 30 tablet 2  . BD PEN NEEDLE NANO U/F 32G X 4 MM MISC USE AS DIRECTED 100 each 6  . ferrous sulfate 325 (65 FE) MG tablet Take 1 tablet (325 mg total) by mouth daily with breakfast. 90 tablet 2  . gabapentin (NEURONTIN) 300 MG capsule Take 300 mg by mouth 3 (three) times daily.     . insulin aspart (NOVOLOG) 100 UNIT/ML injection Sliding scale- If sugar 150-199: inject 2 unit;  200-249: 4 units, 250-299: 7 units; 300-349: 10 units; Over 350: 12 units PENS PLEASE 10 mL 2  . LANTUS SOLOSTAR 100 UNIT/ML Solostar Pen Inject 50 Units into the skin daily at 10 pm. 15 mL 0  . LORazepam (ATIVAN) 1 MG tablet Take 1 mg by mouth every 8 (eight) hours as needed for anxiety.    . metoCLOPramide (REGLAN) 10 MG tablet Take 1 tablet (10 mg total) by mouth every 6 (six) hours. 120 tablet 2  . omeprazole (PRILOSEC) 40 MG capsule Take 1 capsule (40 mg total) by mouth daily. 30 capsule 2  . labetalol (NORMODYNE) 100 MG tablet Take 1 tablet (100 mg total) by mouth 2 (two) times daily. (Patient not taking: Reported on 10/13/2014) 180 tablet 3     Current Outpatient Prescriptions on File Prior to Encounter  Medication Sig Dispense Refill  . ADVAIR DISKUS 100-50 MCG/DOSE AEPB Inhale 1 puff into the lungs 2 (two) times daily. 60 each 2  . albuterol (PROVENTIL HFA;VENTOLIN HFA) 108 (90  BASE) MCG/ACT inhaler Inhale 2 puffs into the lungs every 4 (four) hours as needed for wheezing or shortness of breath. 1 Inhaler 4  . atorvastatin (LIPITOR) 80 MG tablet Take 1 tablet (80 mg total) by mouth daily at 6 PM. 30 tablet 2  . BD PEN NEEDLE NANO U/F 32G X 4 MM MISC USE AS DIRECTED 100 each 6  . ferrous sulfate 325 (65 FE) MG tablet Take 1 tablet (325 mg total) by mouth daily with breakfast. 90 tablet 2  . gabapentin (NEURONTIN) 300 MG capsule Take 300 mg by mouth 3 (three) times daily.     . insulin aspart (NOVOLOG) 100 UNIT/ML injection Sliding scale- If sugar 150-199: inject 2 unit;  200-249: 4 units, 250-299: 7 units; 300-349: 10 units; Over 350: 12 units PENS PLEASE 10 mL 2  . LANTUS SOLOSTAR 100 UNIT/ML Solostar Pen Inject 50 Units into the skin daily at 10 pm. 15 mL 0  . LORazepam (ATIVAN) 1 MG tablet Take 1 mg by mouth every 8 (eight) hours as needed for anxiety.    . metoCLOPramide (REGLAN) 10 MG tablet Take 1 tablet (10 mg total) by mouth every 6 (six) hours. 120 tablet 2  . omeprazole (PRILOSEC) 40 MG capsule Take 1 capsule (40 mg total) by mouth daily. 30 capsule 2  . labetalol (NORMODYNE) 100 MG tablet Take 1 tablet (100 mg total) by mouth 2 (two) times daily. (Patient not taking: Reported on 10/13/2014) 180 tablet 3    Allergies: Allergies as of 11/11/2014 - Review Complete 11/11/2014  Allergen Reaction Noted  . Lisinopril Nausea Only 03/17/2008  . Morphine and related Itching 04/15/2013   Past Medical History  Diagnosis Date  . Thrombocytosis     Hem/Onc suggested 2/2 chronic hepatits and/or iron deficiency anemia  . Iron deficiency anemia   . N&V (nausea and vomiting)     Chronic. Unclear etiology with multiple  admission and ED visits. CT abdomen with and without contrast (02/2011)  showed no acute process. Gastic Emptying scan (01/2010) was normal. Ultrasound of the abdomen was within normal limits. Hepatitis B viral load was undectable. HIV NR. EGD - gastritis,  Hpylori + s/p Rx  . Hypertension   . Hyperlipidemia   . Diabetes mellitus type 2, uncontrolled, with complications   . Recurrent boils   . 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 Cath 12/10- no restenosis or significant CAD progression  . Irregular menses     Small ovarian follicles seen on UX(3/23)  . History of pyelonephritis     H/o GrpB Pyelonephritis (9/06) and UTI- 07/11- E.Coli, 12/10- GBS  . Abscess of tunica vaginalis     10/09- Abundant S. aureus- sensitive to all abx  . Obesity   . Gastritis   . Depression   . Peripheral neuropathy   . Fibromyalgia   . GERD (gastroesophageal reflux disease)   . Anxiety   . Gastroparesis     secondary to poorly controlled DM, last emptying study performed 01/2010  was normal but may be falsely positive as pt was on reglan  . Blood dyscrasia   . Substance abuse   . MI (myocardial infarction) 2008  . OSA (obstructive sleep apnea)     "suppose to wear mask but I don't" (04/22/2014)  . Seasonal asthma   . Hepatitis B, chronic     Hep BeAb+,Hep B cAb+ & Hep BsAg+ (9/06)  . Migraine     "weekly" (04/22/2014)  . CVA (cerebral vascular accident) ~ 02/2014    denies residual on 04/22/2014  . CVA (cerebral vascular accident)     history of remote right cerebellar infarct noted on head CT at least since 10/2011  . Pneumonia     "this is probably the 2nd or 3rd time I've had pneumonia" (04/22/2014)   Past Surgical History  Procedure Laterality Date  . Cesarean section  1997  . Coronary angioplasty with stent placement  2008    "2 stents"  . Esophagogastroduodenoscopy N/A 04/23/2014    Procedure: ESOPHAGOGASTRODUODENOSCOPY (EGD);  Surgeon: Winfield Cunas., MD;  Location: [facility] ENDOSCOPY;  Service: Endoscopy;  Laterality: N/A;   Family History  Problem Relation Age of Onset  . Diabetes Father    History   Social History   . Marital Status: Single    Spouse Name: N/A  . Number of Children: 2  . Years of Education: 11   Occupational History  . other     unemployed   Social History Main Topics  . Smoking status: Former Smoker    Types: Cigarettes    Quit date: 04/24/1996  . Smokeless tobacco: Never Used     Comment: quit smoking cigarettes age 40  . Alcohol Use: Yes     Comment: 04/22/2014 "might have a few drinks a month"  . Drug Use: No     Comment: 04/22/2104 "quit drugs ~ 1-2 yr ago"  . Sexual Activity: Yes   Other Topics Concern  . 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   Also works at [employer] having to lift elderly individuals.          Review of Systems: Review of Systems  Constitutional: Negative for chills.  HENT: Negative for congestion and sore throat.    Otherwise, pertinent items included in HPI.   Vitals: Blood pressure 182/109, pulse 119, temperature 99.2 F (37.3 C), temperature source Oral, resp. rate 18, last menstrual period 10/16/2014, SpO2 100 %.  Physical Exam Constitutional:   Patient appears uncomfortable in bed, moaning throughout interview; cooperative with exam Head: [place]/AT Mouth: no erythema or exudates, MMM Eyes: Pupils small, equally round, reactive to light, conjunctivae normal, no scleral icterus.  Neck: No JVD, mass, thyromegaly, or carotid bruit present.  Cardiovascular: Tachycardic, regular rhythm, no murmur heard Pulmonary: normal respiratory effort, CTAB, no wheezes, rales, or rhonchi Abdominal: Tender to palpation over LLQ; otherwise, non-tender. Non-distended. Bowel sounds are normal. No guarding GU: no CVA tenderness Extremities: No lower extremity edema.  Skin: Skin is warm and dry.  Lab results:  Basic Metabolic Panel:  Recent Labs Lab 11/11/14 0650  NA 134*  K 3.3*  CL 99*  CO2 23  GLUCOSE 247*  BUN 6  CREATININE 0.89  CALCIUM 9.4     Liver Function Tests:  Recent Labs Lab 11/11/14 0650  AST 18  ALT 9*  ALKPHOS 78  BILITOT 0.7  PROT 8.4*  ALBUMIN 3.6    Recent Labs Lab 11/11/14 0650  LIPASE 20*   CBC:  Recent Labs Lab 11/11/14 0758  WBC 16.9*  NEUTROABS 11.9*  HGB 13.7  HCT 39.3  MCV 74.7*  PLT 352   CBG:  Recent Labs Lab 11/11/14 0717 11/11/14 1724  GLUCAP 236* 243*   Urine pregnancy test (11/11/14):  - Negative  Prior Imaging/Procedure results:   CT abdomen (12/31/13):  - No acute process demonstrated in the abdomen or pelvis. Small amount of free fluid in the pelvis is likely physiologic.  Upper endoscopy (04/23/14): - The mucosa of the esophagus appeared normal - Food residue in the gastric body, probably due to gastroparesis - Antrum and pylorus normal. No evidence of gastric outlet obstruction - Recommendation of gastric emptying study - Biopsy preformed which showed chronic focally active gastritis with no evidence of helicobacter pylori, dysplasia, or malignancy, and Warthin-Starry stain negative for presence of H pylori.  Gastric emptying study (01/28/2010): - Gastric retention is 19% at 120 minutes which is within normal limits. Normal retention is less than 30% at 2 hours  Assessment & Plan by Problem: Principal Problem:   Intractable nausea and vomiting Active Problems:   Poorly controlled type 2 diabetes mellitus with peripheral neuropathy   Hyperlipidemia   OBESITY, MORBID   ANXIETY DEPRESSION   Essential hypertension   Tachycardia   Abdominal pain, chronic, left lower quadrant   Gastroparesis   Jasman Pfeifle Chopra is a 39 y.o. female with 39 year old woman with insulin-dependent diabetes and associated gastroparesis , HTN, HLD, CAD s/p angioplasty (2008), obesity, hx of CVA (02/2014), OSA, polysubstance abuse (cocaine, THC) who presents to [facility] with severe left lower quadrant abdominal pain, nausea, and vomiting over the past 2 days likely secondary  to gastroparesis.   #Nausea/Vomiting/acutely increased LLQ abdominal pain likely 2/2 gastroparesis. Patient has a h/o chronic N/V and abdominal pain with multiple ED visits and recent hospitalization for hematemesis. Patient's hemoglobin normal on admission, but patient reports blood in vomit. Will obtain occult blood test of emetic fluid. Her most recent EGD in December 2015, was negative for gastritis, but showed food residue in the gastric body c/w gastroparesis. Most likely etiology of symptoms is gastroparesis given her past history of gastroparesis and  has not been taking home  Reglan. Viral or bacterial enteritis also possible. Bowel obstruction unlikely given that she had normal stool yesterday, after start of symptoms. An atypical presentation of ACS is possible especially considering her history of CAD (s/p stent placement in 2008). Will obtain ECG, 1 x troponin, and place on tele overnight to r/o.  Diverticulitis another potential cause of symptoms, however most recent CT scan in 03/2014 did note any evidence of diverticular disease. Patient had negative UPT making ectopic pregnancy unlikely. Ovarian torsion also possible, however, much less likely. Could consider pelvic ultrasound if symptoms do not improve.  - NPO overnight - NS + 75mEq KCl @125mL /hr (K - 3.3) - Reglan 10mg  IV TID - Protonix 40mg  IV QD [ ]  f/u EKG [ ]  f/u UDS [ ]  f/u troponin - telemetry overnight - morphine 1mg  q4h prn for pain; re-evaluate for possible de-escalation of pain management in am; notably, patient complained at 10/10 LLQ pain during previous admission managed with Tylenol, Toradol and Kpad - repeat BMP in am - repeat CBC tomorrow pm [ ]  Consider gastric emptying study as outpatient - patient will need to be off Reglan and opioids for accurate study [ ]  f/u occult blood test of emetic fluid  #Uncontrolled Insulin Dependent DMII.  Normal AG and bicarb on admission. Last HgbA1C on 10/13/2014 was 12.9%. Lowest  A1c on record 7.8 (07/18/2010). Patient is poorly compliant with insulin at home. She reports having no Lantus at home as of 7/18 and no Novolog for at least the past week. - Lantus 20U qhs - SSI-s - CBG q4h  #Leukocytosis. WBC on admission is 16.9. Patient has a h/o chronic leukocytosis of unknown etiology. At baseline WBC 11-16. Per chart review, she has been evaluated by hematology. Might be related to her anemia vs cocaine use vs possible PCOS, since pt with h/o irregular periods, obesity.  [ ]  f/u urinalysis [ ]  Re-check CBC tomorrow pm  #Tachycardia. HR in the 110s.  Likely related to acute stress, pain.  Possibly related to hypovolemia, however, her BPs elevated on admission and she is s/p 2L NS in ED.  #HTN. On admission, systolics BPs to the 536I.  Patient is on labetalol 100 mg BID at home. Will hold labetalol while NPO and start IV metoprolol. Consider addition of Coreg, metoprolol succinate or bisoprolol due to mortality benefit in patients with CAD. - Start IV metoprolol 5mg  q6h  #Chronic LLQ pain. Patient has baseline LLQ pain. She had a transvaginal and transabdominal US on 05/20/2013, which showed an intramural posterior fibroid, with no evidence of ovarian pathology or hydrosalpinx. She has been previously referred to see an OBGYN, as her pain was thought to be gynecologic in nature 2/2 fibroid vs endometriosis, but she has not followed up.  - F/u with OB/GYN as an outpatient  #Substance abuse. Patient with active substance abuse endorsing most recent cocaine use 10 days ago.  Will counsel on abstaining from cocaine.  [ ]  f/u UDS  #HLD. Lipid panel on 07/21/2013:   total cholesterol 169, LDL 108, HDL 41. Will hold Lipitor while NPO.  Consider addition of aspirin 81mg  as an outpatient.  - Hold home Lipitor while NPO  #Asthma. Chronic cough unchanged from baseline. On Advair and albuterol at home. Increased SOB with episodes of vomiting. Otherwise, at baseline. - Proventil  inhaler q4h prn - Dulera 2 puffs BID   #History of thrombocytosis. Patient has a h/o chronic thrombocytosis of unknown etiology. Plts normal on admission at 352. Last anemia panel 04/22/2014:  iron < 10, ferritin 15, TIBC incalculable.  - Will hold iron supplement while NPO and in setting of GI distress  #FEN/GI/PPX - Fluids: NS + 38mEq KCl @ 173mL/hr - Nutrition: NPO - VTE ppx:  Lovenox - GI ppx: Protonix per above  #Code Status:  Full code  #Dispo: Disposition is deferred at this time, awaiting improvement of current medical problems. Anticipated discharge in approximately 2-3 day(s).   The patient does have a current PCP Shirley Salina, MD) and does need an [facility] hospital follow-up appointment after discharge.  This is a Careers information officer Note.  The care of the patient was discussed with Dr. Denton [name] and the assessment and plan was formulated with their assistance.  Please see their note for official documentation of the patient encounter.   Signed: Quita Skye, Med Student 11/11/2014, 6:55 PM

## 2014-11-11 NOTE — Progress Notes (Signed)
Shirley [name] in ED to receive report.

## 2014-11-12 DIAGNOSIS — J45909 Unspecified asthma, uncomplicated: Secondary | ICD-10-CM

## 2014-11-12 DIAGNOSIS — E1165 Type 2 diabetes mellitus with hyperglycemia: Secondary | ICD-10-CM

## 2014-11-12 DIAGNOSIS — R1032 Left lower quadrant pain: Secondary | ICD-10-CM

## 2014-11-12 DIAGNOSIS — F1420 Cocaine dependence, uncomplicated: Secondary | ICD-10-CM

## 2014-11-12 DIAGNOSIS — Z862 Personal history of diseases of the blood and blood-forming organs and certain disorders involving the immune mechanism: Secondary | ICD-10-CM

## 2014-11-12 DIAGNOSIS — E1143 Type 2 diabetes mellitus with diabetic autonomic (poly)neuropathy: Principal | ICD-10-CM

## 2014-11-12 DIAGNOSIS — D72829 Elevated white blood cell count, unspecified: Secondary | ICD-10-CM

## 2014-11-12 DIAGNOSIS — Z7951 Long term (current) use of inhaled steroids: Secondary | ICD-10-CM

## 2014-11-12 DIAGNOSIS — Z794 Long term (current) use of insulin: Secondary | ICD-10-CM

## 2014-11-12 LAB — GLUCOSE, CAPILLARY
GLUCOSE-CAPILLARY: 140 mg/dL — AB (ref 65–99)
GLUCOSE-CAPILLARY: 147 mg/dL — AB (ref 65–99)
GLUCOSE-CAPILLARY: 207 mg/dL — AB (ref 65–99)
Glucose-Capillary: 158 mg/dL — ABNORMAL HIGH (ref 65–99)
Glucose-Capillary: 136 mg/dL — ABNORMAL HIGH (ref 65–99)
Glucose-Capillary: 154 mg/dL — ABNORMAL HIGH (ref 65–99)

## 2014-11-12 LAB — OCCULT BLOOD GASTRIC / DUODENUM (SPECIMEN CUP): OCCULT BLOOD, GASTRIC: POSITIVE — AB

## 2014-11-12 LAB — BASIC METABOLIC PANEL
Anion gap: 12 (ref 5–15)
BUN: 7 mg/dL (ref 6–20)
CHLORIDE: 104 mmol/L (ref 101–111)
CO2: 23 mmol/L (ref 22–32)
Calcium: 9.1 mg/dL (ref 8.9–10.3)
Creatinine, Ser: 0.89 mg/dL (ref 0.44–1.00)
GFR calc Af Amer: >60 mL/min (ref >60)
GFR calc non Af Amer: >60 mL/min (ref >60)
Glucose, Bld: 151 mg/dL — ABNORMAL HIGH (ref 65–99)
POTASSIUM: 4.1 mmol/L (ref 3.5–5.1)
Sodium: 139 mmol/L (ref 135–145)

## 2014-11-12 LAB — TROPONIN I

## 2014-11-12 MED ORDER — INSULIN ASPART 100 UNIT/ML ~~LOC~~ SOLN
0.0000 [IU] | Freq: Three times a day (TID) | SUBCUTANEOUS | Status: DC
  Administered 2014-11-12: 1 [IU] via SUBCUTANEOUS

## 2014-11-12 MED ORDER — KETOROLAC TROMETHAMINE 15 MG/ML IJ SOLN
15.0000 mg | Freq: Three times a day (TID) | INTRAMUSCULAR | Status: DC | PRN
  Administered 2014-11-12 – 2014-11-15 (×9): 15 mg via INTRAVENOUS
  Filled 2014-11-12 (×10): qty 1

## 2014-11-12 MED ORDER — INSULIN ASPART 100 UNIT/ML ~~LOC~~ SOLN
0.0000 [IU] | Freq: Three times a day (TID) | SUBCUTANEOUS | Status: DC
  Administered 2014-11-12: 2 [IU] via SUBCUTANEOUS
  Administered 2014-11-12: 1 [IU] via SUBCUTANEOUS
  Administered 2014-11-13 (×2): 3 [IU] via SUBCUTANEOUS
  Administered 2014-11-13 (×2): 2 [IU] via SUBCUTANEOUS
  Administered 2014-11-14: 3 [IU] via SUBCUTANEOUS
  Administered 2014-11-14: 2 [IU] via SUBCUTANEOUS
  Administered 2014-11-14: 5 [IU] via SUBCUTANEOUS
  Administered 2014-11-15: 9 [IU] via SUBCUTANEOUS

## 2014-11-12 MED ORDER — ACETAMINOPHEN 325 MG PO TABS
650.0000 mg | ORAL_TABLET | Freq: Four times a day (QID) | ORAL | Status: DC | PRN
  Administered 2014-11-12: 650 mg via ORAL
  Filled 2014-11-12: qty 2

## 2014-11-12 MED ORDER — ACETAMINOPHEN 650 MG RE SUPP: 650.0000 mg | Freq: Four times a day (QID) | RECTAL | Status: DC | PRN

## 2014-11-12 NOTE — Progress Notes (Signed)
Utilization Review completed. Elliana Bal RN BSN CM 

## 2014-11-12 NOTE — Progress Notes (Signed)
K Pad ordered from portable supply.

## 2014-11-12 NOTE — Clinical Social Work Note (Signed)
CSW has attempted to meet with the patient re her substance use. The patient will not stay awake for CSW. Patient asks that CSW revisit at a later time. CSW will attempt to assess patient later if time allows. Report left for covering CSW.   Liz Beach MSW, [place], [place], [phone]

## 2014-11-12 NOTE — Progress Notes (Signed)
  Date: 11/12/2014  Patient name: Shirley [name]  Medical record number: [id number]  Date of birth: 12-02-1975   I have seen and evaluated [employer] and discussed their care with the Residency Team. [name] was admitted 7/20 for acte on chronic LLQ pain and N/V. Pls see Dr Wayland Salinas H&P for further details. This AM, she was seen with the IM team. She was rocking in pain which started after we entered the room and asked for Ativan to calm her down. She has taken no PO since admit. She has had no BM since admit. She states she no longer has medicaid and cannot afford her meds. She dry heaved once in the room. The day nurse and tech did not receive sign out of any vomiting over night.  Filed Vitals:   11/12/14 1337  BP: 182/134  Pulse: 126  Temp: 99.6 F (37.6 C)  Resp: 18   Gen : chronically ill appearing, obese. Voluntary gross shaking of whole body that cesses when she makes intentional movement. H tachy, no MRG LCTAB  ABD - + S, further exam limited by pts reluctance to lie flat for exam Ext no edema Skin no rash Neuro alert and answering questions appropriately  Assessment and Plan: I have seen and evaluated the patient as outlined above. I agree with the formulated Assessment and Plan as detailed in the residents' admission note, with the following changes:   1. Acute on chronic N/V - the current working dx is flare up of her gastroparesis, supported by poorly controlled DM and residual food on EGD in Dec 2015. Other etiologies, although less likely, inc viral gastroenteritis or food borne illness - less likely bc this flare up is similar to past flares. Pancreatis R/O by nl lipase. Ischemic bowel possible 2/2 cocaine but unlikely as no lower GI bleeding, acidosis, hypotension, and this is same as prior episodes. No lab evidence of DKA despite not taking insulin. Diverticular dz unlikely as no fever, D. - Avoid opioids to prevent worsening of gastroparesis - Trial of liquids  today - Cont reglan, stop phenergan  2. Acute on chronic LLQ pain - I feel this is unrelated to #1. She has had CT scans without an etiology found. She was found to have a posterior fibroid on transvaginal U/S Jan 2015. The acute flare might be 2/2 the vomiting and dry heaving but not clear what is causing the chronic component. No need for inpt colonoscopy as no constipation / D as outpt.   3. Uncontrolled DM type II insulin dep - DKA is not the cause of her N/V as her gap is 12, her bicarb is 23, and her CBG are < 200. She has poor control chronically and this will need to cont to be addressed as an outpt. She has already developed macrovascular complications.   4. Cocaine addiction - she has had multiple UDS + cocaine going back as far as 2011. This is undoubtedly contributing to her poor overall health. She remains ar risk for further CNS & cardiac events and bowel ischemia. Social work consult.   5. TWI inf leads - doubt ACS but will repeat Trop I and EKG  Likely D/C 7/22 or 7/23 but depends on ability to take PO  Bartholomew Crews, MD 7/21/201611:50 AM

## 2014-11-12 NOTE — Progress Notes (Signed)
MD at bedside and patient complaining of abdominal pain. MD verbal order to hold morphine PRN and wait for new orders that will be placed. Patient aware.

## 2014-11-12 NOTE — Care Management Note (Signed)
Case Management Note  Patient Details  Name: Shirley [name] MRN: [id number] Date of Birth: May 20, 1975  Subjective/Objective:                 Patient from home, lives with spouse; self care. [facility]    Action/Plan:  Will continue to follow.  Expected Discharge Date:                  Expected Discharge Plan:  Home/Self Care  In-House Referral:     Discharge planning Services  CM Consult  Post Acute Care Choice:    Choice offered to:     DME Arranged:    DME Agency:     HH Arranged:    HH Agency:     Status of Service:  In process, will continue to follow  Medicare Important Message Given:    Date Medicare IM Given:    Medicare IM give by:    Date Additional Medicare IM Given:    Additional Medicare Important Message give by:     If discussed at [place] of Stay Meetings, dates discussed:    Additional Comments:  Carles Collet, RN 11/12/2014, 11:23 AM

## 2014-11-12 NOTE — Progress Notes (Signed)
Subjective: Prior to entering room, patient appeared to be lying comfortably in bed. During interview, patient complained of severe LLQ abdominal pain of similar severity/unchanged from yesterday afternoon.  She was visibly shaking throughout interview and physical exam stating stating that it helps with her pain. She dry heaved once during interview without emesis. She states that she had several episodes of emesis over night. She denies chest pain, palpitations, and shortness of breath.  She states that she lost Medicaid coverage 2 weeks ago and hasn't been able to afford medications.  Objective: Vital signs in last 24 hours: Filed Vitals:   11/12/14 0529 11/12/14 0655 11/12/14 0702 11/12/14 0803  BP: 184/125 177/105 160/92   Pulse: 123 116 102   Temp: 98.7 F (37.1 C)     TempSrc: Oral     Resp: 18     SpO2: 98% 100%  99%   Weight change:   Intake/Output Summary (Last 24 hours) at 11/12/14 1003 Last data filed at 11/12/14 2505  Gross per 24 hour  Intake 1583.33 ml  Output    900 ml  Net 683.33 ml   Constitutional: Patient appears uncomfortable in bed; shaking during exam, but not when changing positions in bed Head: [place]/AT Cardiovascular: Tachycardic, regular rhythm, no murmur heard Pulmonary: normal respiratory effort, CTAB, no wheezes, rales, or rhonchi Abdominal: Tender to palpation over LLQ; otherwise, non-tender. Non-distended. Bowel sounds are normal. No guarding GU: no CVA tenderness Extremities: No lower extremity edema.  Skin: Skin is warm and dry.  Labs: Basic Metabolic Panel:  Recent Labs Lab 11/11/14 0650 11/12/14 0401  NA 134* 139  K 3.3* 4.1  CL 99* 104  CO2 23 23  GLUCOSE 247* 151*  BUN 6 7  CREATININE 0.89 0.89  CALCIUM 9.4 9.1   Liver Function Tests:  Recent Labs Lab 11/11/14 0650  AST 18  ALT 9*  ALKPHOS 78  BILITOT 0.7  PROT 8.4*  ALBUMIN 3.6    Recent Labs Lab 11/11/14 0650  LIPASE 20*   No results for input(s): AMMONIA in  the last 168 hours. CBC:  Recent Labs Lab 11/11/14 0758  WBC 16.9*  NEUTROABS 11.9*  HGB 13.7  HCT 39.3  MCV 74.7*  PLT 352   Cardiac Enzymes:  Recent Labs Lab 11/11/14 1700  TROPONINI <0.03   CBG:  Recent Labs Lab 11/11/14 0717 11/11/14 1724 11/11/14 1958 11/12/14 0003 11/12/14 0441 11/12/14 0751  GLUCAP 236* 243* 201* 207* 140* 158*   Urine Drug Screen: Drugs of Abuse     Component Value Date/Time   LABOPIA POSITIVE* 11/11/2014 1658   LABOPIA NEGATIVE 02/21/2010 2201   COCAINSCRNUR POSITIVE* 11/11/2014 1658   COCAINSCRNUR NEGATIVE 02/21/2010 2201   LABBENZ NONE DETECTED 11/11/2014 1658   LABBENZ NEGATIVE 02/21/2010 2201   AMPHETMU NONE DETECTED 11/11/2014 1658   AMPHETMU NEGATIVE 02/21/2010 2201   THCU NONE DETECTED 11/11/2014 1658   LABBARB NONE DETECTED 11/11/2014 1658    Urinalysis:  Recent Labs Lab 11/11/14 1658  COLORURINE YELLOW  LABSPEC 1.015  PHURINE 7.0  GLUCOSEU >1000*  HGBUR TRACE*  BILIRUBINUR NEGATIVE  KETONESUR 15*  PROTEINUR NEGATIVE  UROBILINOGEN 0.2  NITRITE NEGATIVE  LEUKOCYTESUR NEGATIVE   Troponin:  11/11/14 (10:01): <0.03  Other results: EKG (21:13:23):  - Normal sinus rhythm (rate = 97) - PR 124, QRS 76, QT/QTc 352/447 - Poor R wave progression with abrupt increase in R wave amplitude from V3 to V4 - Small q waves in III, avF - New T wave inversion on  III and avF compared to prior scan (10/13/14) - Otherwise, no ST/T wave abnormalities  Imaging: No results found.  Medications:  Infusions:    Scheduled Medications: . antiseptic oral rinse  7 mL Mouth Rinse q12n4p  . chlorhexidine  15 mL Mouth Rinse BID  . enoxaparin (LOVENOX) injection  40 mg Subcutaneous Q24H  . insulin aspart  0-9 Units Subcutaneous TID AC & HS  . insulin glargine  20 Units Subcutaneous QHS  . metoCLOPramide (REGLAN) injection  10 mg Intravenous 3 times per day  . metoprolol  5 mg Intravenous 4 times per day  . mometasone-formoterol  2  puff Inhalation BID  . pantoprazole (PROTONIX) IV  40 mg Intravenous Q24H  . sodium chloride  3 mL Intravenous Q12H    PRN Medications: acetaminophen **OR** acetaminophen, albuterol, ketorolac    Assessment/Plan: Principal Problem:   Intractable nausea and vomiting Active Problems:   Poorly controlled type 2 diabetes mellitus with peripheral neuropathy   Hyperlipidemia   OBESITY, MORBID   ANXIETY DEPRESSION   Essential hypertension   Tachycardia   Abdominal pain, chronic, left lower quadrant   Gastroparesis  Shirley [name] is a 39 y.o. female with 39 year old woman with insulin-dependent diabetes and associated gastroparesis , HTN, HLD, CAD s/p angioplasty (2008), obesity, hx of CVA (02/2014), OSA, polysubstance abuse (cocaine, THC) who presents to [facility] with severe left lower quadrant abdominal pain, nausea, and vomiting over the past 2 days likely secondary to gastroparesis.   #Nausea/Vomiting/acutely increased LLQ abdominal pain likely 2/2 gastroparesis. 2x unmeasured episodes of emesis charted overnight. Patient has a h/o chronic N/V and abdominal pain with multiple ED visits and recent hospitalization for hematemesis. Hemoglobin 13.7 - up from baseline, but patient reports blood in vomit - will obtain hemoccult test of emetic fluid and repeat CBC in am. Her most recent EGD in December 2015, was negative for gastritis, but showed food residue in the gastric body c/w gastroparesis. Most likely etiology of symptoms is gastroparesis given her past history of gastroparesis; patinet has not been taking home Reglan. Viral or bacterial enteritis also possible. Normal stool on 7/19 after onset of sx, bowel obstruction unlikely. Given history of cocaine use, ischemic colitis possible, but no hematochezia, absence of diarrhea, and normal BMP argues against.  An atypical presentation of ACS is possible especially considering her history of CAD (s/p stent placement in 2008) - patient  had new T wave inversions in III, aVF. Will obtain repeat ECG, and repeat 1 x troponin. Diverticulitis unlikely given most recent CT scan in 03/2014 did note show any evidence of diverticular disease, no fever.  - d/c IVF; start clear diet and advance as tolerated - Cont Reglan 10mg  IV TID, Protonix 40mg  IV QD with transition to PO if patient tolerating clear liquid diet - Repeat ECG, troponin x 1 - for pain, d/c morphine given potential to exacerbate gastroparesis; notably, patient complained at 10/10 LLQ pain during previous admission which was managed with Tylenol, Toradol and Kpad - will start Tylenol PRN, Toradol PRN, Kpad - repeat BMP, CBC in am [ ]  Consider gastric emptying study as outpatient - patient will need to be off Reglan and opioids for accurate study [ ]  f/u occult blood test of emetic fluid  #Uncontrolled Insulin Dependent DMII. Last CBGs 140, 158.  - Continue Lantus 20U qhs - Continue SSI-s - CBG qAC and qhs - Speak with pharmacy tomorrow morning regarding access to meds as outpatient; patient states she lost Medicaid coverage 2  weeks ago  #Leukocytosis. WBC on admission is 16.9. Patient has a h/o chronic leukocytosis of unknown etiology. At baseline WBC 11-16. Per chart review, she has been evaluated by hematology. Might be related to her anemia vs cocaine use vs possible PCOS, since pt with h/o irregular periods, obesity.  - repeat CBC tomorrow morning  #Tachycardia. HR in the 100s. Likely related to acute stress, pain. Possibly related to hypovolemia, however, her BPs elevated and she is s/p 2L NS in ED + IVF overnight. CTM.  #HTN. Most recent BP 160/92.  Patient is on labetalol 100 mg PO BID at home. Currently on metoprolol 5mg  q6h IV -- will transias s. Consider addition of Coreg, metoprolol succinate or bisoprolol as outpatient due to mortality benefit in patients with CAD. - Currently metoprolol 5mg  q6h IV, will transition to home labetalol 100mg  PO BID when patient  is tolerating clear diet.  #Chronic LLQ pain. Patient has baseline LLQ pain. She had a transvaginal and transabdominal US on 05/20/2013, which showed an intramural posterior fibroid, with no evidence of ovarian pathology or hydrosalpinx. She has been previously referred to see an OBGYN, as her pain was thought to be gynecologic in nature 2/2 fibroid vs endometriosis, but she has not followed up.  - F/u with OB/GYN as an outpatient [ ]  Patient has no colonoscopy on record. Consider as outpatient given chronic abdominal pain.  #Substance abuse. Patient with active substance abuse endorsing most recent cocaine use 10 days ago. Will counsel on abstaining from cocaine.  - UDS positive for cocaine - Consult CSW for area resources for substance abuse  #HLD. Lipid panel on 07/21/2013: total cholesterol 169, LDL 108, HDL 41. Will hold Lipitor while NPO. Consider addition of aspirin 81mg  as an outpatient.  - Restart Lipitor when tolerating PO diet  #Asthma. Chronic cough unchanged from baseline. On Advair and albuterol at home. Increased SOB with episodes of vomiting. Otherwise, at baseline. - Proventil inhaler q4h prn - Dulera 2 puffs BID   #History of thrombocytosis. Patient has a h/o chronic thrombocytosis of unknown etiology. Plts normal on admission at 352. Last anemia panel 04/22/2014: iron < 10, ferritin 15, TIBC incalculable.  - Will hold iron supplement  in setting of GI distress; consider restarting at discharge.  #FEN/GI/PPX - Fluids: none - Nutrition: Clears; advance as tolerated - VTE ppx: Lovenox - GI ppx: Protonix per above  #Code Status:  Full code  #Disposition - Disposition is deferred at this time, awaiting improvement of nausea, vomiting, abdominal pain.  - Anticipated discharge in approximately 2-3 day(s).  - The patient does have a current PCP Hinton Lovely, MD) and does need an [facility] hospital follow-up appointment after discharge.     SERVICE NEEDED AT [place]         Y = Yes, Blank = No PT:   OT:   RN:   Equipment:   Other:    Length of Stay: 1 day(s)  This is a Careers information officer Note.  The care of the patient was discussed with Dr. Lynnae January and the assessment and plan formulated with their assistance.  Please see their attached note for official documentation of the daily encounter.   LOS: 1 day   Quita Skye, Med Student 11/12/2014, 10:03 AM

## 2014-11-12 NOTE — Progress Notes (Signed)
Dr.Taylor was called & made aware of pt's B/P 177/105,no further orders received.Will cont.to monitor pt.

## 2014-11-13 DIAGNOSIS — I10 Essential (primary) hypertension: Secondary | ICD-10-CM

## 2014-11-13 DIAGNOSIS — R000 Tachycardia, unspecified: Secondary | ICD-10-CM

## 2014-11-13 DIAGNOSIS — G8929 Other chronic pain: Secondary | ICD-10-CM

## 2014-11-13 DIAGNOSIS — E785 Hyperlipidemia, unspecified: Secondary | ICD-10-CM

## 2014-11-13 DIAGNOSIS — R1110 Vomiting, unspecified: Secondary | ICD-10-CM

## 2014-11-13 DIAGNOSIS — R1031 Right lower quadrant pain: Secondary | ICD-10-CM

## 2014-11-13 DIAGNOSIS — K3184 Gastroparesis: Secondary | ICD-10-CM

## 2014-11-13 LAB — CBC
HCT: 38.9 % (ref 36.0–46.0)
Hemoglobin: 13.5 g/dL (ref 12.0–15.0)
MCH: 26.5 pg (ref 26.0–34.0)
MCHC: 34.7 g/dL (ref 30.0–36.0)
MCV: 76.4 fL — ABNORMAL LOW (ref 78.0–100.0)
Platelets: 387 10*3/uL (ref 150–400)
RBC: 5.09 MIL/uL (ref 3.87–5.11)
RDW: 18.1 % — ABNORMAL HIGH (ref 11.5–15.5)
WBC: 15.6 10*3/uL — ABNORMAL HIGH (ref 4.0–10.5)

## 2014-11-13 LAB — BASIC METABOLIC PANEL
ANION GAP: 11 (ref 5–15)
BUN: 11 mg/dL (ref 6–20)
CALCIUM: 9 mg/dL (ref 8.9–10.3)
CO2: 22 mmol/L (ref 22–32)
CREATININE: 1.01 mg/dL — AB (ref 0.44–1.00)
Chloride: 102 mmol/L (ref 101–111)
GFR calc non Af Amer: >60 mL/min (ref >60)
GLUCOSE: 163 mg/dL — AB (ref 65–99)
POTASSIUM: 3.7 mmol/L (ref 3.5–5.1)
SODIUM: 135 mmol/L (ref 135–145)

## 2014-11-13 LAB — GLUCOSE, CAPILLARY
GLUCOSE-CAPILLARY: 194 mg/dL — AB (ref 65–99)
GLUCOSE-CAPILLARY: 164 mg/dL — AB (ref 65–99)
GLUCOSE-CAPILLARY: 175 mg/dL — AB (ref 65–99)
Glucose-Capillary: 203 mg/dL — ABNORMAL HIGH (ref 65–99)

## 2014-11-13 MED ORDER — PANTOPRAZOLE SODIUM 40 MG PO TBEC
40.0000 mg | DELAYED_RELEASE_TABLET | Freq: Every day | ORAL | Status: DC
  Administered 2014-11-13 – 2014-11-15 (×3): 40 mg via ORAL
  Filled 2014-11-13 (×3): qty 1

## 2014-11-13 MED ORDER — LABETALOL HCL 100 MG PO TABS
100.0000 mg | ORAL_TABLET | Freq: Two times a day (BID) | ORAL | Status: DC
  Administered 2014-11-13 – 2014-11-15 (×4): 100 mg via ORAL
  Filled 2014-11-13 (×5): qty 1

## 2014-11-13 MED ORDER — ONDANSETRON HCL 4 MG PO TABS
4.0000 mg | ORAL_TABLET | Freq: Three times a day (TID) | ORAL | Status: DC | PRN
  Administered 2014-11-13 – 2014-11-15 (×5): 4 mg via ORAL
  Filled 2014-11-13 (×5): qty 1

## 2014-11-13 MED ORDER — METOCLOPRAMIDE HCL 10 MG PO TABS
10.0000 mg | ORAL_TABLET | Freq: Three times a day (TID) | ORAL | Status: DC
  Administered 2014-11-13 – 2014-11-15 (×8): 10 mg via ORAL
  Filled 2014-11-13 (×9): qty 1

## 2014-11-13 NOTE — Progress Notes (Signed)
Patient's HR on telemetry 99. When entering the room patient appeared asleep then began shaking as she woke up and RN introduced self to patient. HR now 120 on telemetry while RN in room. When patient asked about shaking she stated "I always shake". RN noticed yesterday it appeared that patient would be at rest then begin shaking when people enter the room. When RN left room HR began to climb down to 106 on telemetry.Patient stated she wasn't cold when offered a warm blanket.

## 2014-11-13 NOTE — Progress Notes (Signed)
Subjective: Patient lying on her side without shaking when entering room. Patient states that she had one episode of vomitng earlier this morning. She states that she was able to drink a small amount of apple juice, but had trouble keeping it down. She continues to be nauseous.  Otherwise, abdominal pain is not improved; however, she states that her current LLQ abdominal pain is not significantly worse than her her baseline (when without N/V). She feels that her LLQ pain is worse with menstruation. Patient states her last stool was on 7/19 and that she hasn't had a BM since admission. She states that she is passing flatulence.   Objective: Vital signs in last 24 hours: Filed Vitals:   11/13/14 0046 11/13/14 0522 11/13/14 0700 11/13/14 0715  BP: 187/102 151/97    Pulse: 105 124 98 99  Temp:  98.2 F (36.8 C)    TempSrc:  Oral    Resp:  20    Height:      Weight:      SpO2:  97%     Weight change:   Intake/Output Summary (Last 24 hours) at 11/13/14 1128 Last data filed at 11/13/14 4098  Gross per 24 hour  Intake     60 ml  Output     25 ml  Net     35 ml   Constitutional: Patient appears uncomfortable in bed; mildly shaking during exam (improved from yesterday), but not when changing positions in bed Head: [place]/AT Cardiovascular: Tachycardic, regular rhythm, no murmur heard Pulmonary: normal respiratory effort, CTAB, no wheezes, rales, or rhonchi Abdominal: Tender to palpation over LLQ; otherwise, non-tender. Non-distended. Bowel sounds are normal. No guarding GU: no CVA tenderness Extremities: No lower extremity edema.  Skin: Skin is warm and dry.  Labs: Basic Metabolic Panel:  Recent Labs Lab 11/12/14 0401 11/13/14 0445  NA 139 135  K 4.1 3.7  CL 104 102  CO2 23 22  GLUCOSE 151* 163*  BUN 7 11  CREATININE 0.89 1.01*  CALCIUM 9.1 9.0   CBC:  Recent Labs Lab 11/11/14 0758 11/13/14 0445  WBC 16.9* 15.6*  NEUTROABS 11.9*  --   HGB 13.7 13.5  HCT 39.3 38.9    MCV 74.7* 76.4*  PLT 352 387   Cardiac Enzymes:  Recent Labs Lab 11/11/14 1700 11/12/14 1330  TROPONINI <0.03 <0.03   CBG:  Recent Labs Lab 11/12/14 0441 11/12/14 0751 11/12/14 1143 11/12/14 1602 11/12/14 2116 11/13/14 0810  GLUCAP 140* 158* 136* 154* 147* 194*    Other results: ECG (11/11/14 @ 21:13:23):  - Normal sinus rhythm (rate = 97) - PR 124, QRS 76, QT/QTc 352/447 - Poor R wave progression with abrupt increase in R wave amplitude from V3 to V4 - Small q waves in III, avF - T wave inversion on III and avF; new compared to prior scan (10/13/14) - Otherwise, no ST/T wave abnormalities  ECG (11/12/14 @ 10:31:57): - Normal sinus rhythm (rate = 106) - PR 120, QRS 76, QT/QTc 336/446 - Poor R wave progression with abrupt increase in R wave amplitude from V4 to V5 - Small q waves in III and avF - T wave inversions in leads III, avF; unchanged for prior study (11/11/14) - Otherwise, no ST/T wave abnormalities  ECG (04/21/14 @ 18:17): - Notably, TWIs present in leads III and borderline TWIs in AVF  Imaging: No results found.  Medications:  Infusions:    Scheduled Medications: . antiseptic oral rinse  7 mL Mouth Rinse q12n4p  .  chlorhexidine  15 mL Mouth Rinse BID  . enoxaparin (LOVENOX) injection  40 mg Subcutaneous Q24H  . insulin aspart  0-9 Units Subcutaneous TID AC & HS  . insulin glargine  20 Units Subcutaneous QHS  . metoCLOPramide (REGLAN) injection  10 mg Intravenous 3 times per day  . metoprolol  5 mg Intravenous 4 times per day  . mometasone-formoterol  2 puff Inhalation BID  . pantoprazole (PROTONIX) IV  40 mg Intravenous Q24H  . sodium chloride  3 mL Intravenous Q12H    PRN Medications: acetaminophen **OR** acetaminophen, albuterol, ketorolac    Assessment/Plan: Principal Problem:   Intractable nausea and vomiting Active Problems:   Poorly controlled type 2 diabetes mellitus with peripheral neuropathy   Hyperlipidemia   OBESITY,  MORBID   ANXIETY DEPRESSION   Essential hypertension   Tachycardia   Abdominal pain, chronic, left lower quadrant   Gastroparesis   #Acute on chronic nausea/vomiting. N/V Improving -- patient had 1 x 3mL episode of emesis over past 24 hours. She has 60 mL PO intake recorded over past 24 hours. Troponin x 2 negative. ECG x  2 not concerning for ACS. Notably, TWIs in III and aVF also seen in prior tracings (see 04/21/14). N/V likely secondary to gastroparesis. Other etiologies possible, including viral gastreneteritis or food borne illness, however, less likely. Occult blood in emesis positive, however, hemoglobin stable 13.7 -> 13.5; blood likely secondary to Mallory-Weiss tears; will CTM. Most recent EGD in December 2015 was negative for gastritis, but showed food residue in the gastric body c/w gastroparesis.  Patient has not BM during admission, but had normal stool on 7/19 after onset of sx and is passing flatulence; bowel obstruction unlikely. - Encourage PO intake; advance diet as tolerated - Transition to PO Reglan 10mg  TID, Protonix 40mg  QD now that patient is tolerating clear liquid diet - Zofran 4mg  Q8H PRN for nausea - Continue Tylenol, Toradol and Kpad for pain - Cr 0.89 -> 1.01, likely secondary to mild dehydration due to poor PO intake yesterday, repeat BMP tomorrow am  - CBC prior to discharge [ ]  Consider gastric emptying study as outpatient - patient will need to be off Reglan and opioids for accurate study  #Chronic LLQ pain. Patient has baseline LLQ pain. She had a transvaginal and transabdominal US on 05/20/2013, which showed an intramural posterior fibroid, with no evidence of ovarian pathology or hydrosalpinx.  Additionally, she states that pain is worse during menstruation. She has been previously referred to see an OBGYN, as her pain was thought to be gynecologic in nature 2/2 fibroid vs endometriosis, but she has not followed up.  - F/u with OB/GYN as an outpatient [ ]   Patient has no colonoscopy on record. Consider as outpatient given chronic abdominal pain.  #Uncontrolled Insulin Dependent DMII. Last CBGs 147, 194, 164.  - Continue Lantus 20U qhs - Continue SSI-s - CBG qAC and qhs - CSW to speak with patient today; formal consult placed regarding possibilities for medication assistance (CSW attempted to speak with patient yesterday, but patient asked her to return today)  #Leukocytosis. WBC on admission is 16.9 -> 15.6 (11/13/14). Patient has a h/o chronic leukocytosis of unknown etiology. At baseline WBC 11-16. Per chart review, she has been evaluated by hematology in past with note from Dr. Evlyn Clines (03/04/10). At that time, peripheral smear showed absent neutrophilia with mild left shift immaturation, elvated LAP suggestive of reactive process, and smear not suggestive of CML. Chronic leukocytosis could possibly be related  to her anemia vs cocaine use vs possible PCOS. - WBC trending down, consider repeat CBC prior to discharge  #HTN. Most recent BP 151/97. Patient is on labetalol 100 mg PO BID at home. Consider addition of Coreg, metoprolol succinate or bisoprolol as outpatient due to mortality benefit in patients with CAD. - Transition to home labetalol 100mg  PO BID now that patient is tolerating clear diet.  #Substance abuse. Patient with active substance abuse endorsing most recent cocaine use 10 days ago. Will counsel on abstaining from cocaine.  - UDS positive for cocaine - Consult CSW for area resources for substance abuse  #HLD. Lipid panel on 07/21/2013: total cholesterol 169, LDL 108, HDL 41. Will hold Lipitor while NPO. Consider addition of aspirin 81mg  as an outpatient.  - Restart Lipitor now that patient is tolerating PO diet  #Asthma. Chronic cough unchanged from baseline. On Advair and albuterol at home. Increased SOB with episodes of vomiting. Otherwise, at baseline. - Proventil inhaler q4h prn - Dulera 2 puffs BID    #FEN/GI/PPX - Fluids: none - Nutrition: Clears; advance as tolerated - VTE ppx: Lovenox - GI ppx: Protonix per above  #Code Status:  Full code  #Disposition -Disposition is deferred at this time, awaiting improvement of nausea/vomiting.  -Anticipated discharge in approximately 1-2 day(s).  -The patient does have a current PCP Hinton Lovely, MD) and does need an [facility] hospital follow-up appointment after discharge.     SERVICE NEEDED AT [place]         Y = Yes, Blank = No PT:   OT:   RN:   Equipment:   Other:    Length of Stay: 2 day(s)  This is a Careers information officer Note.  The care of the patient was discussed with Dr. Denton Brick and the assessment and plan formulated with their assistance.  Please see their attached note for official documentation of the daily encounter.   LOS: 2 days   Quita Skye, Med Student 11/13/2014, 11:28 AM

## 2014-11-13 NOTE — Clinical Social Work Note (Signed)
Clinical Social Worker attempted to meet with patient regarding her substance use for the second time. The patient states that she is going to vomit and does not want to engage in conversation at this time. Patient asks that CSW revisit at a later time. CSW will attempt to assess patient later if time allows. CSW available for support as needed.  Barbette Or, [place]

## 2014-11-13 NOTE — Progress Notes (Signed)
  Date: 11/13/2014  Patient name: Shirley [name]  Medical record number: [id number]  Date of birth: January 05, 1976   This patient has been seen and the plan of care was discussed with the house staff. Please see their note for complete details. I concur with their findings with the following additions/corrections: [name] looks better today but states no better since admit. One documented emesis yesterday. Taking small amts liquids. Still with N and ABD pain. No longer gross shaking / rocking. BP 180/114. Labs stable.  1. Acute on chronic N/V likely 2/2 diabetic gastroparesis - reglan. No opioids. Liquids  2. Acute on chronic LLQ pain - no etiology has been found. Cont supportive care.  3. TWI inf leads - Trop I negative x2. EKG unchanged. Reviewed EKG's and present in Jan and April 2016 , with resolution afterwards. Np CP. No further W/U.   Likely D/C 23rd or 24th, depending on PO intake  Bartholomew Crews, MD 11/13/2014, 1:06 PM

## 2014-11-13 NOTE — Progress Notes (Signed)
Subjective:  No significant change in left lower quadrant pain. Minimal vomiting on clear diet. Tele- showed lots of artifacts, and narrow complex tachycardia.  Objective: Vital signs in last 24 hours: Filed Vitals:   11/13/14 0046 11/13/14 0522 11/13/14 0700 11/13/14 0715  BP: 187/102 151/97    Pulse: 105 124 98 99  Temp:  98.2 F (36.8 C)    TempSrc:  Oral    Resp:  20    Height:      Weight:      SpO2:  97%     Weight change:   Intake/Output Summary (Last 24 hours) at 11/13/14 1242 Last data filed at 11/13/14 7893  Gross per 24 hour  Intake     60 ml  Output     25 ml  Net     35 ml   General appearance: alert, cooperative, appears stated age and no distress Lungs: clear to auscultation bilaterally Heart: regular rate and rhythm, S1, S2 normal, no murmur, click, rub or gallop Abdomen: soft, non-tender; bowel sounds normal; no masses,  no organomegaly Extremities: extremities normal, atraumatic, no cyanosis or edema   Lab Results: Basic Metabolic Panel:  Recent Labs Lab 11/12/14 0401 11/13/14 0445  NA 139 135  K 4.1 3.7  CL 104 102  CO2 23 22  GLUCOSE 151* 163*  BUN 7 11  CREATININE 0.89 1.01*  CALCIUM 9.1 9.0   Liver Function Tests:  Recent Labs Lab 11/11/14 0650  AST 18  ALT 9*  ALKPHOS 78  BILITOT 0.7  PROT 8.4*  ALBUMIN 3.6    Recent Labs Lab 11/11/14 0650  LIPASE 20*   CBC:  Recent Labs Lab 11/11/14 0758 11/13/14 0445  WBC 16.9* 15.6*  NEUTROABS 11.9*  --   HGB 13.7 13.5  HCT 39.3 38.9  MCV 74.7* 76.4*  PLT 352 387   Cardiac Enzymes:  Recent Labs Lab 11/11/14 1700 11/12/14 1330  TROPONINI <0.03 <0.03   CBG:  Recent Labs Lab 11/12/14 0751 11/12/14 1143 11/12/14 1602 11/12/14 2116 11/13/14 0810 11/13/14 1153  GLUCAP 158* 136* 154* 147* 194* 164*   Urine Drug Screen: Drugs of Abuse     Component Value Date/Time   LABOPIA POSITIVE* 11/11/2014 1658   LABOPIA NEGATIVE 02/21/2010 2201   COCAINSCRNUR POSITIVE*  11/11/2014 1658   COCAINSCRNUR NEGATIVE 02/21/2010 2201   LABBENZ NONE DETECTED 11/11/2014 1658   LABBENZ NEGATIVE 02/21/2010 2201   AMPHETMU NONE DETECTED 11/11/2014 1658   AMPHETMU NEGATIVE 02/21/2010 2201   THCU NONE DETECTED 11/11/2014 1658   LABBARB NONE DETECTED 11/11/2014 1658    Urinalysis:  Recent Labs Lab 11/11/14 1658  COLORURINE YELLOW  LABSPEC 1.015  PHURINE 7.0  GLUCOSEU >1000*  HGBUR TRACE*  BILIRUBINUR NEGATIVE  KETONESUR 15*  PROTEINUR NEGATIVE  UROBILINOGEN 0.2  NITRITE NEGATIVE  LEUKOCYTESUR NEGATIVE   Medications: I have reviewed the patient's current medications. Scheduled Meds: . antiseptic oral rinse  7 mL Mouth Rinse q12n4p  . chlorhexidine  15 mL Mouth Rinse BID  . enoxaparin (LOVENOX) injection  40 mg Subcutaneous Q24H  . insulin aspart  0-9 Units Subcutaneous TID AC & HS  . insulin glargine  20 Units Subcutaneous QHS  . metoCLOPramide  10 mg Oral TID AC  . metoprolol  5 mg Intravenous 4 times per day  . mometasone-formoterol  2 puff Inhalation BID  . pantoprazole  40 mg Oral Daily  . sodium chloride  3 mL Intravenous Q12H   Continuous Infusions:  PRN Meds:.acetaminophen **OR**  acetaminophen, albuterol, ketorolac Assessment/Plan:  Persistent Nausea and vomiting with Abdominal Pain- Most likely due to Gastroparesis. Likely due chronic uncontrolled Diabtes. EGD- 2015- suggested Gastroparesis.  - Cont tele. - Cont clear liquid diet., and advance as tolerated. - Discont N/s for now. - Tylenol and Toradol for now.  - Switch IV Protonix- 40mg  daily to PO - Switch IV Reglan- 10mg  TID to PO - Gastric emptying study when stable or as outpt- will need to be off reglan and opioids. - UDS- positive for cocaine  Poorly controlled DM- HGbA1c- 12.9- 4 weeks ago. Chronically uncontrolled, lowest Hgba1c- 7.3 in 2008, and since then mostly 9-10. Home meds- Lantus 50u daily with SSI. -SSI- Q4H - Lantus- 20u daily - CBGs controlled for now, but no taking  a lot PO  Tachycardia- up to 140s. Likely due to acute illness- vomiting and severe abdominal pain. - EKG- with T wave inversion in III and AVF, that were present earlier this year    HTN- BP elevated. Resume home meds- Labetalol- 100mg  BID, consider switching to Coreg considering hx of CAD. - Resume home Bp meds- labetalol 100mg  BID  FEN- Repleted.  DVT ppx- Lovenox.  Dispo: Disposition is deferred at this time, awaiting improvement of current medical problems.  Anticipated discharge in approximately 1-2 day(s).    LOS: 2 days   Bethena Roys, MD 11/13/2014, 12:42 PM

## 2014-11-14 DIAGNOSIS — E1140 Type 2 diabetes mellitus with diabetic neuropathy, unspecified: Secondary | ICD-10-CM

## 2014-11-14 LAB — GLUCOSE, CAPILLARY
GLUCOSE-CAPILLARY: 109 mg/dL — AB (ref 65–99)
GLUCOSE-CAPILLARY: 178 mg/dL — AB (ref 65–99)
GLUCOSE-CAPILLARY: 267 mg/dL — AB (ref 65–99)
Glucose-Capillary: 197 mg/dL — ABNORMAL HIGH (ref 65–99)

## 2014-11-14 MED ORDER — GLUCERNA SHAKE PO LIQD
237.0000 mL | Freq: Three times a day (TID) | ORAL | Status: DC
  Administered 2014-11-14 – 2014-11-15 (×4): 237 mL via ORAL

## 2014-11-14 NOTE — Progress Notes (Signed)
Subjective: Complainst of persistent chronic left lower abdominal pain appears improved compared to on admission, worse with periods. She has been able to keep food down but still have some dry heaving.      Objective: Vital signs in last 24 hours: Filed Vitals:   11/13/14 1252 11/13/14 2219 11/14/14 0614 11/14/14 1055  BP: 180/114 167/117 153/97 129/91  Pulse:  121 136 92  Temp:  98.9 F (37.2 C) 98.6 F (37 C)   TempSrc:  Oral Oral   Resp:  18 18   Height:      Weight:      SpO2:  100% 100%    Weight change:   Intake/Output Summary (Last 24 hours) at 11/14/14 1059 Last data filed at 11/14/14 1031  Gross per 24 hour  Intake    883 ml  Output    700 ml  Net    183 ml   General appearance: lying in bed, alert, cooperative, appears stated age and no distress Lungs: clear to auscultation bilaterally Heart: Slightly tachycardic, regular rate and rhythm, S1, S2 normal, no murmur, click, rub or gallop Abdomen: soft, non-tender; bowel sounds normal; no masses, no organomegaly Extremities: extremities normal, atraumatic, no cyanosis or edema   Lab Results: Basic Metabolic Panel:  Recent Labs Lab 11/12/14 0401 11/13/14 0445  NA 139 135  K 4.1 3.7  CL 104 102  CO2 23 22  GLUCOSE 151* 163*  BUN 7 11  CREATININE 0.89 1.01*  CALCIUM 9.1 9.0   Liver Function Tests:  Recent Labs Lab 11/11/14 0650  AST 18  ALT 9*  ALKPHOS 78  BILITOT 0.7  PROT 8.4*  ALBUMIN 3.6    Recent Labs Lab 11/11/14 0650  LIPASE 20*   CBC:  Recent Labs Lab 11/11/14 0758 11/13/14 0445  WBC 16.9* 15.6*  NEUTROABS 11.9*  --   HGB 13.7 13.5  HCT 39.3 38.9  MCV 74.7* 76.4*  PLT 352 387   Cardiac Enzymes:  Recent Labs Lab 11/11/14 1700 11/12/14 1330  TROPONINI <0.03 <0.03   CBG:  Recent Labs Lab 11/12/14 2116 11/13/14 0810 11/13/14 1153 11/13/14 1705 11/13/14 2216 11/14/14 0837  GLUCAP 147* 194* 164* 203* 175* 109*   Urine Drug Screen: Drugs of Abuse       Component Value Date/Time   LABOPIA POSITIVE* 11/11/2014 1658   LABOPIA NEGATIVE 02/21/2010 2201   COCAINSCRNUR POSITIVE* 11/11/2014 1658   COCAINSCRNUR NEGATIVE 02/21/2010 2201   LABBENZ NONE DETECTED 11/11/2014 1658   LABBENZ NEGATIVE 02/21/2010 2201   AMPHETMU NONE DETECTED 11/11/2014 1658   AMPHETMU NEGATIVE 02/21/2010 2201   THCU NONE DETECTED 11/11/2014 1658   LABBARB NONE DETECTED 11/11/2014 1658    Urinalysis:  Recent Labs Lab 11/11/14 1658  COLORURINE YELLOW  LABSPEC 1.015  PHURINE 7.0  GLUCOSEU >1000*  HGBUR TRACE*  BILIRUBINUR NEGATIVE  KETONESUR 15*  PROTEINUR NEGATIVE  UROBILINOGEN 0.2  NITRITE NEGATIVE  LEUKOCYTESUR NEGATIVE   Medications: I have reviewed the patient's current medications. Scheduled Meds: . antiseptic oral rinse  7 mL Mouth Rinse q12n4p  . chlorhexidine  15 mL Mouth Rinse BID  . enoxaparin (LOVENOX) injection  40 mg Subcutaneous Q24H  . insulin aspart  0-9 Units Subcutaneous TID AC & HS  . insulin glargine  20 Units Subcutaneous QHS  . labetalol  100 mg Oral BID  . metoCLOPramide  10 mg Oral TID AC  . mometasone-formoterol  2 puff Inhalation BID  . pantoprazole  40 mg Oral Daily  . sodium  chloride  3 mL Intravenous Q12H   Continuous Infusions:  PRN Meds:.acetaminophen **OR** acetaminophen, albuterol, ketorolac, ondansetron Assessment/Plan:  Persistent Nausea and vomiting with Abdominal Pain- Most likely due to Gastroparesis. Likely due chronic uncontrolled Diabtes. EGD- 2015- suggested Gastroparesis.  - DisCont tele. - Cont clear liquid diet, and advance as tolerated, trial of glucerna shakes today - Tylenol and Toradol for pain now. - Switch IV Protonix- 40mg  daily to PO - Switch IV Reglan- 10mg  TID to PO - Gastric emptying study when stable or as outpt- will need to be off reglan and opioids. - UDS- positive for cocaine - Likely d/c today or tomorrow if able to hold down liquids, full liquid diet. - Cocaine use may be  contributing to symptoms - Follow up as outpt for orange card.  Abdominal Pain- left lower quadrant. She says pain is worse during her periods. She has been referred to [facility] in the past but did not keep appointment. - Follow up as outpt - Possible follow up with Gynae  Poorly controlled DM- HGbA1c- 12.9- 4 weeks ago. Chronically uncontrolled, lowest Hgba1c- 7.3 in 2008, and since then mostly 9-10. Home meds- Lantus 50u daily with SSI. -SSI- Q4H - Lantus- 20u daily - CBGs controlled for now, but no taking a lot PO  Tachycardia- Improving, intermittently becomes tachycardic, as in prior admission, possibly related to cocaine use. - D/c tele    HTN- BP elevated. Resume home meds- Labetalol- 100mg  BID, - Resume home Bp meds- labetalol 100mg  BID  FEN- Repleted.  DVT ppx- Lovenox.  Dispo: Disposition is deferred at this time, awaiting improvement of current medical problems.  Anticipated discharge in approximately today or tomorrow.    LOS: 3 days   Bethena Roys, MD 11/14/2014, 10:59 AM

## 2014-11-14 NOTE — Progress Notes (Signed)
Patient ID: GRACY EHLY, female   DOB: 03-04-1976, 39 y.o.   MRN: [id number] Medicine attending: I personally examined this patient this morning together with resident physician Dr. Denton Brick and I concur with her evaluation and management plan which we discussed together. Bad situation with no easy solutions. Refractory diabetic gastroparesis. She continues to have intermittent dry heaves. She is not tolerating a solid diet. She is taking minimal amounts of liquids. No significant improvement with Reglan and a proton pump inhibitor. Erratic by mouth intake but blood sugars in reasonable range 109-203. Frustrating situation for both patient and physician. We are going to see if she can tolerate Glucerna nutritional supplement to maintain adequate caloric intake. Continue parenteral fluid support until she is able to take more by mouth.

## 2014-11-14 NOTE — Progress Notes (Signed)
No evidence of emesis during this shift.

## 2014-11-14 NOTE — Progress Notes (Signed)
Pt voiced no complaints of nausea or vomiting at this time. Pt appears restless and rocking legs from side to side in bed. HR trending 109-115.

## 2014-11-15 LAB — GLUCOSE, CAPILLARY
GLUCOSE-CAPILLARY: 311 mg/dL — AB (ref 65–99)
Glucose-Capillary: 371 mg/dL — ABNORMAL HIGH (ref 65–99)
Glucose-Capillary: 294 mg/dL — ABNORMAL HIGH (ref 65–99)

## 2014-11-15 MED ORDER — OMEPRAZOLE 40 MG PO CPDR
40.0000 mg | DELAYED_RELEASE_CAPSULE | Freq: Every day | ORAL | Status: DC
Start: 2014-11-15 — End: 2015-03-28

## 2014-11-15 MED ORDER — LABETALOL HCL 100 MG PO TABS: 100.0000 mg | ORAL_TABLET | Freq: Two times a day (BID) | ORAL | Status: DC

## 2014-11-15 MED ORDER — INSULIN GLARGINE 100 UNIT/ML SOLOSTAR PEN: 50.0000 [IU] | PEN_INJECTOR | Freq: Every day | SUBCUTANEOUS | Status: DC

## 2014-11-15 MED ORDER — INSULIN ASPART 100 UNIT/ML ~~LOC~~ SOLN
0.0000 [IU] | Freq: Three times a day (TID) | SUBCUTANEOUS | Status: DC
  Administered 2014-11-15: 8 [IU] via SUBCUTANEOUS
  Administered 2014-11-15: 11 [IU] via SUBCUTANEOUS

## 2014-11-15 MED ORDER — METOCLOPRAMIDE HCL 10 MG PO TABS: 10.0000 mg | ORAL_TABLET | Freq: Three times a day (TID) | ORAL | Status: DC

## 2014-11-15 MED ORDER — INSULIN ASPART 100 UNIT/ML FLEXPEN: 2.0000 [IU] | PEN_INJECTOR | Freq: Three times a day (TID) | SUBCUTANEOUS | Status: DC

## 2014-11-15 NOTE — Progress Notes (Signed)
Patient discharge teaching given, including activity, diet, follow-up appoints, and medications. Patient verbalized understanding of all discharge instructions. IV access was d/c'd. Vitals are stable. Skin is intact except as charted in most recent assessments. Pt to be escorted out by NT, to be driven home by family. 

## 2014-11-15 NOTE — Progress Notes (Addendum)
CM consulted regarding pt with no insurance, no job. CM spoke with pt and pt confirmed has no health insurance and no job/ financially limited. CM obtained Match Letter for pt to help assist with medication needs @ d/c . Pt to f/u with PCP/Internal  Medicine Practice for refills/medication needs. Match letter delivered to pt and explained by CM. Pt verbally stated understanding of Match letter usage. No other needs identified Per CM @ present time. Whitman Hero RN,BSN,CM [phone]

## 2014-11-15 NOTE — Discharge Summary (Signed)
Name: Shirley [name] MRN: [id number] DOB: 06/09/1975 39 y.o. PCP: Collier Salina, MD  Date of Admission: 11/11/2014  6:21 AM Date of Discharge: 11/15/2014 Attending Physician: Bartholomew Crews, MD  Discharge Diagnosis:  Principal Problem:   Intractable nausea and vomiting Active Problems:   Poorly controlled type 2 diabetes mellitus with peripheral neuropathy   Hyperlipidemia   OBESITY, MORBID   ANXIETY DEPRESSION   Essential hypertension   Tachycardia   Abdominal pain, chronic, left lower quadrant   Gastroparesis  Discharge Medications:   TAKE these medications        ADVAIR DISKUS 100-50 MCG/DOSE Aepb  Generic drug:  Fluticasone-Salmeterol  Inhale 1 puff into the lungs 2 (two) times daily.     albuterol 108 (90 BASE) MCG/ACT inhaler  Commonly known as:  PROVENTIL HFA;VENTOLIN HFA  Inhale 2 puffs into the lungs every 4 (four) hours as needed for wheezing or shortness of breath.     atorvastatin 80 MG tablet  Commonly known as:  LIPITOR  Take 1 tablet (80 mg total) by mouth daily at 6 PM.     BD PEN NEEDLE NANO U/F 32G X 4 MM Misc  Generic drug:  Insulin Pen Needle  USE AS DIRECTED     ferrous sulfate 325 (65 FE) MG tablet  Take 1 tablet (325 mg total) by mouth daily with breakfast.     gabapentin 300 MG capsule  Commonly known as:  NEURONTIN  Take 300 mg by mouth 3 (three) times daily.     insulin aspart 100 UNIT/ML FlexPen  Commonly known as:  NOVOLOG FLEXPEN  Inject 2-12 Units into the skin 3 (three) times daily with meals. Blood sugar- 150-199- take 2units, 200- 249- take 4units,  250- 299- take 7units, 300- 349- take 10units, >350- take 12units.     Insulin Glargine 100 UNIT/ML Solostar Pen  Commonly known as:  LANTUS SOLOSTAR  Inject 50 Units into the skin daily at 10 pm.     labetalol 100 MG tablet  Commonly known as:  NORMODYNE  Take 1 tablet (100 mg total) by mouth 2 (two) times daily.     LORazepam 1 MG tablet  Commonly known as:   ATIVAN  Take 1 mg by mouth every 8 (eight) hours as needed for anxiety.     metoCLOPramide 10 MG tablet  Commonly known as:  REGLAN  Take 1 tablet (10 mg total) by mouth 3 (three) times daily before meals.     omeprazole 40 MG capsule  Commonly known as:  PRILOSEC  Take 1 capsule (40 mg total) by mouth daily.        Disposition and follow-up:   Shirley [name] was discharged from [facility] in Good condition.  At the hospital follow up visit please address:  1.  - Orange card to help with affording medications, patient able to get medications arranged through Match prior to discharge.  - Resolution of vomiting and abdominal pain. Compliance with reglan, TID with meals, also taking her Insulin and Labetalol.  - Refferal to Gynae, to evaluate left sided lower quadrant pain worse with Menstrual periods. - Follow up Gastric emptying study. - After three months on reglan consider discontinuation of use, to reduce risk of Extrapyramidal side effects.  2.  Labs / imaging needed at time of follow-up: None  3.  Pending labs/ test needing follow-up: None  Follow-up Appointments:     Follow-up Information    Follow up with Burgess Estelle,  MD On 11/19/2014.   Specialty:  Internal Medicine   Why:  at 10:45   Contact information:   [place] [place] [id number] [phone]       Follow up with Hinton Lovely, MD On 11/26/2014.   Specialty:  Internal Medicine   Why:  at [facility] information:   [address] [address] [phone]       Discharge Instructions: Discharge Instructions    Diet - low sodium heart healthy    Complete by:  As directed      Discharge instructions    Complete by:  As directed   Please take your Reglan three times a day before you eat your meals.   We will help you get the important medications you need to get, Your blood pressure medication- Labetalol, Your Insulin, your Gastroparesis medication- Reglan  and acid reflux medication -Omeprazole.  Please keep your appointment on the 28th of this month, in our clinic, this is important. Also see our finanacial counsellor about getting the discount/orange card that same day. The oange card can help give you some discount on some medications.  Once again it is very important you take your medications everyday.     Increase activity slowly    Complete by:  As directed            Consultations:  None  Procedures Performed:  No results found.  2D Echo: None  Cardiac Cath: None  Admission HPI: Chief Complaint: Vomiting and Abdominal Pain.  History of Present Illness: 89 Y O F with PMH of- Uncontrolled DM with hx of gastroparesis, Chronic left abdominal pain, HTN, Substance abuse, HLD, Coronary artery disease, depression, Asthma, OSA, chronic hep B. Presented today with c/o Vomiting and Nausea, and severe abdominal pain that all started suddenly on Monday. Shirley [name] describes suddenly became sweaty and then vomiting. Shirley [name] has had several episodes of vomiting since this started. Initially non bloody, but later with some small amount of blood. Also endorses some Shortness of breath when episode began. Abdominal pain also started about the same time, located in her left lower quadrant, non radiating and in the same area where Shirley [name] usually has pain when Shirley [name] has her gastroparesis flare. Her last bowel movement was yesterday, normal formed stool. No diarrhea.  Shirley [name] denies fever or chills. Shirley [name] denies chest pain or palapitations. Shirley [name] denies burning with urination, but endorses frequency.  Shirley [name] has not been taking some of her medications- Insulins for 2 days, and the other PO meds- for about 10 days. Shirley [name] says her last Cocaine use was about 10 days ago. Shirley [name] has some opioids- hydrocodone at home- Shirley [name] has taken them intermittently.  In the ED Shirley [name] was given 2L of IVF, fentanyl, reglan, phenergan and Zofran without any relief of her symptoms.   Physical Exam: Blood  pressure 165/104, pulse 116, temperature 98.9 F (37.2 C), temperature source Oral, resp. rate 22, last menstrual period 10/16/2014, SpO2 99 %. General appearance: alert, cooperative, appears stated age and mild-mod distress, restless in the bed Head: Normocephalic, without obvious abnormality, atraumatic, dry mucus membranes Lungs: clear to auscultation bilaterally Heart: Tachycardic, but regular rate and rhythm, S1, S2 normal, no murmurs. Abdomen: soft, tenderness left lower quadrant without rebound or guarding; reduced bowel sounds , no masses, no organomegaly Extremities: extremities normal, atraumatic, no cyanosis or edema Skin: Skin color, texture, turgor normal. Has scratch marks on her back.     Hospital Course by problem list:  1. Persistent Nausea and vomiting with Abdominal Pain- Symptoms consistent with her prior episodes of gastroparesis, due to chronic uncontrolled Diabetes, with non compliance with pro-motility agents reglan for ~2 weeks. Work up included lipase - 20, negative pregnancy test, UA with few bacteria, 0-2 WBC, neg Leukocyte esterase and Nitrite, UDS positive for opiates and cocaine, Bmet- K - 3.3 which was repleted, CBC showed hemoglobin of 13.7, up from 9.7 . Shirley [name] had an elevated WBC to 16.9, but patient has a baseline leukocytosis. Shirley [name] had a negative troponin X 2 and an ECG significant for TWIs in the inferior leads. Further review of past ECGs showed TWIs in January and April 2016 with resolutions afterwards. Has had a gastric emptying study done- 2011, which was negative but patient was on reglan, EGD- 2015- suggested Gastroparesis. Pt was placed on Bowel rest, initial Pain control with Morphine which was d/c  Due to likely worsening of gastroparesis symptoms, and switched to ketorolac. Pts diet was gradually advanced and by discharge patient was able to tolerate a regular diet, MATCH letter was gotten for patient so that Shirley [name] could get her medications, prior to her [facility]  clinic visit. Shirley [name] will need Orange card/discaount card. Pt encouraged to take a gastroparesis diet, also take her reglan before meals.   2. Chronic LLQ pain. Patient has baseline LLQ pain, worse with periods. As mentioned above, multiple CT scans have been negative for any acute processes that could explain her pain. Shirley [name] has never had a colonoscopy. Shirley [name] did had a transvaginal and transabdominal US on 05/20/2013, which showed an intramural posterior fibroid which may be related to her abdominal pain. Shirley [name] has been previously referred to see an OBGYN, as her pain was thought to be gynecologic in nature 2/2 fibroid vs endometriosis, but Shirley [name] has not followed up.  We do feel that Shirley [name] would benefit from seeing an OBGYN as an outpatient as her LLQ may have a gynecologic contribution. PCP may also consider referral for colonoscopy.    3. Poorly controlled DM- Last HGbA1c- 12.9. Chronically uncontrolled, lowest Hgba1c- 7.3 in 2008, and since then mostly 9-10. Home meds- Lantus 50u daily with SSI. Consider switching Sliding scale insulin to meal coverage or adding meal coverage to sliding scale for better control.  4. HTN- BP elevated during admission intermittently, but by discharge controlled on Home labetalol 100mg  BID.   5. Leukocytosis. WBC on admission was 16.9. Patient has a history chronic leukocytosis of unknown etiology with baseline 11-16. Per chart review, Shirley [name] was evaluated by Dr. Evlyn Clines (hematology) on 03/04/2010. At that time, peripheral smear showed absent neutrophilia with mild left shift immaturation, elevated LAP suggestive of reactive process, and smear not suggestive of CML. Chronic leukocytosis could possibly be related to her anemia vs cocaine use vs possible PCOS.   Discharge Vitals:   BP 112/61 mmHg  Pulse 102  Temp(Src) 98.1 F (36.7 C) (Oral)  Resp 16  Ht 5\' 7"  (1.702 m)  Wt 210 lb (95.255 kg)  BMI 32.88 kg/m2  SpO2 100%  LMP 10/16/2014  Discharge Labs:  Results for  orders placed or performed during the hospital encounter of 11/11/14 (from the past 24 hour(s))  Glucose, capillary     Status: Abnormal   Collection Time: 11/14/14  5:47 PM  Result Value Ref Range   Glucose-Capillary 267 (H) 65 - 99 mg/dL  Glucose, capillary     Status: Abnormal   Collection Time: 11/14/14  9:49 PM  Result Value Ref Range   Glucose-Capillary  197 (H) 65 - 99 mg/dL  Glucose, capillary     Status: Abnormal   Collection Time: 11/15/14  8:28 AM  Result Value Ref Range   Glucose-Capillary 371 (H) 65 - 99 mg/dL  Glucose, capillary     Status: Abnormal   Collection Time: 11/15/14 12:23 PM  Result Value Ref Range   Glucose-Capillary 311 (H) 65 - 99 mg/dL  Glucose, capillary     Status: Abnormal   Collection Time: 11/15/14  4:57 PM  Result Value Ref Range   Glucose-Capillary 294 (H) 65 - 99 mg/dL    Signed: Bethena Roys, MD 11/15/2014, 5:23 PM   Services Ordered on Discharge: None Equipment Ordered on Discharge: None

## 2014-11-15 NOTE — Progress Notes (Signed)
[place]   ED CM consulted by ACOLE, CM for medication assistance   CM reviewed EPIC notes and chart information CM spoke with the pt about [facility] MATCH program ($3 co pay for each Rx through [facility] program, does not include refills, 7 day expiration of MATCH letter and choice of pharmacies) Pt agreed to receive assistance from program    Pt is eligible for [facility] MATCH program (unable to find pt listed in PDMI per cardholder name inquiry)  PDMI information entered. [place] letter completed and provided to pt.                    Dear Shirley [name]:  You have been approved to have the prescriptions written by your discharging physician filled through our [facility] (Medication Assistance Through [facility]) program. This program allows for a one-time (no refills) 34-day supply of selected medications for a low copay amount.  The copay is $3.00 per prescription. For instance, if you have one prescription, you will pay $3.00; for two prescriptions, you pay $6.00; for three prescriptions, you pay $9.00; and so on.  Only certain pharmacies are participating in this program with [facility]. You will need to select one of the pharmacies from the attached list and take your prescriptions, this letter, and your photo ID to one of the participating pharmacies.   We are excited that you are able to use the [facility] program to get your medications. These prescriptions must be filled within 7 days of hospital discharge or they will no longer be valid for the [facility] program. Should you have any problems with your prescriptions please contact your case management team member at [phone].  Thank you,    Care Management         [place]

## 2014-11-15 NOTE — Discharge Instructions (Signed)
1) The following appointments have been scheduled on you behalf: - Hospital follow-up appoitment with Dr. Saraiya, [place] on 11/19/14 at 10:45pm - PCP visit with Dr. Benjamine Mola (your new PCP), [place] on 11/26/14 at 3pm  2) Please take all your medications as prescribed, especially insulin and Reglan.  3) Your diet can play a role in your episodes of nausea and vomiting. Foods that are fatty, acidic, spicy, and roughage-based may increase your symptoms.  Carbonated beverages may also aggravate your symptoms.  You should also avoid alcohol and smoking because as they can increase the time it take for your stomach to process food which can worsen your symptoms.   Gastroparesis  Gastroparesis is also called slowed stomach emptying (delayed gastric emptying). It is a condition in which the stomach takes too long to empty its contents. It often happens in people with diabetes.  CAUSES  Gastroparesis happens when nerves to the stomach are damaged or stop working. When the nerves are damaged, the muscles of the stomach and intestines do not work normally. The movement of food is slowed or stopped. High blood glucose (sugar) causes changes in nerves and can damage the blood vessels that carry oxygen and nutrients to the nerves. RISK FACTORS  Diabetes.  Post-viral syndromes.  Eating disorders (anorexia, bulimia).  Surgery on the stomach or vagus nerve.  Gastroesophageal reflux disease (rarely).  Smooth muscle disorders (amyloidosis, scleroderma).  Metabolic disorders, including hypothyroidism.  Parkinson disease. SYMPTOMS   Heartburn.  Feeling sick to your stomach (nausea).  Vomiting of undigested food.  An early feeling of fullness when eating.  Weight loss.  Abdominal bloating.  Erratic blood glucose levels.  Lack of appetite.  Gastroesophageal reflux.  Spasms of the stomach wall. Complications can include:  Bacterial  overgrowth in stomach. Food stays in the stomach and can ferment and cause bacteria to grow.  Weight loss due to difficulty digesting and absorbing nutrients.  Vomiting.  Obstruction in the stomach. Undigested food can harden and cause nausea and vomiting.  Blood glucose fluctuations caused by inconsistent food absorption. DIAGNOSIS  The diagnosis of gastroparesis is confirmed through one or more of the following tests:  Barium X-rays and scans. These tests look at how long it takes for food to move through the stomach.  Gastric manometry. This test measures electrical and muscular activity in the stomach. A thin tube is passed down the throat into the stomach. The tube contains a wire that takes measurements of the stomach's electrical and muscular activity as it digests liquids and solid food.  Endoscopy. This procedure is done with a long, thin tube called an endoscope. It is passed through the mouth and gently down the esophagus into the stomach. This tube helps the caregiver look at the lining of the stomach to check for any abnormalities.  Ultrasonography. This can rule out gallbladder disease or pancreatitis. This test will outline and define the shape of the gallbladder and pancreas. TREATMENT   Treatments may include:  Exercise.  Medicines to control nausea and vomiting.  Medicines to stimulate stomach muscles.  Changes in what and when you eat.  Having smaller meals more often.  Eating low-fiber forms of high-fiber foods, such as eating cooked vegetables instead of raw vegetables.  Eating low-fat foods.  Consuming liquids, which are easier to digest.  In severe cases, feeding tubes and intravenous (IV) feeding may be needed. It is important to note that in most cases, treatment does not cure  gastroparesis. It is usually a lasting (chronic) condition. Treatment helps you manage the underlying condition so that you can be as healthy and comfortable as possible. Other  treatments  A gastric neurostimulator has been developed to assist people with gastroparesis. The battery-operated device is surgically implanted. It emits mild electrical pulses to help improve stomach emptying and to control nausea and vomiting.  The use of botulinum toxin has been shown to improve stomach emptying by decreasing the prolonged contractions of the muscle between the stomach and the small intestine (pyloric sphincter). The benefits are temporary. SEEK MEDICAL CARE IF:   You have diabetes and you are having problems keeping your blood glucose in goal range.  You are having nausea, vomiting, bloating, or early feelings of fullness with eating.  Your symptoms do not change with a change in diet.

## 2014-11-15 NOTE — Progress Notes (Signed)
Subjective: No complaints today. Ready to go home. Ate Pizza yesterday evening. Also ate breakfast today. No more abdominal pain, no dry heaving or nausea. Expresses concern over getting her medications prior to discharge as she no longer has insurance. Complainst of bilateral burning pian in her lower extremities.  Objective: Vital signs in last 24 hours: Filed Vitals:   11/14/14 2151 11/15/14 0637 11/15/14 0725 11/15/14 0910  BP: 106/76 130/84  139/82  Pulse: 98 97  111  Temp: 98.5 F (36.9 C) 98.4 F (36.9 C)    TempSrc: Oral Oral    Resp: 18 18    Height:      Weight:      SpO2: 100% 100% 98%    Weight change:   Intake/Output Summary (Last 24 hours) at 11/15/14 1037 Last data filed at 11/15/14 0926  Gross per 24 hour  Intake    778 ml  Output      0 ml  Net    778 ml   General appearance: lying in bed, alert, cooperative, appears stated age and no distress Lungs: clear to auscultation bilaterally Heart: Normal sinus rhythm, regular rate and rhythm, S1, S2 normal, no murmur, click, rub or gallop Abdomen: soft, non-tender; bowel sounds normal; no masses, no organomegaly Extremities: extremities normal, atraumatic, no cyanosis or edema   Lab Results: Basic Metabolic Panel:  Recent Labs Lab 11/12/14 0401 11/13/14 0445  NA 139 135  K 4.1 3.7  CL 104 102  CO2 23 22  GLUCOSE 151* 163*  BUN 7 11  CREATININE 0.89 1.01*  CALCIUM 9.1 9.0   Liver Function Tests:  Recent Labs Lab 11/11/14 0650  AST 18  ALT 9*  ALKPHOS 78  BILITOT 0.7  PROT 8.4*  ALBUMIN 3.6    Recent Labs Lab 11/11/14 0650  LIPASE 20*   CBC:  Recent Labs Lab 11/11/14 0758 11/13/14 0445  WBC 16.9* 15.6*  NEUTROABS 11.9*  --   HGB 13.7 13.5  HCT 39.3 38.9  MCV 74.7* 76.4*  PLT 352 387   Cardiac Enzymes:  Recent Labs Lab 11/11/14 1700 11/12/14 1330  TROPONINI <0.03 <0.03   CBG:  Recent Labs Lab 11/13/14 2216 11/14/14 0837 11/14/14 1144 11/14/14 1747 11/14/14 2149  11/15/14 0828  GLUCAP 175* 109* 178* 267* 197* 371*   Urine Drug Screen: Drugs of Abuse     Component Value Date/Time   LABOPIA POSITIVE* 11/11/2014 1658   LABOPIA NEGATIVE 02/21/2010 2201   COCAINSCRNUR POSITIVE* 11/11/2014 1658   COCAINSCRNUR NEGATIVE 02/21/2010 2201   LABBENZ NONE DETECTED 11/11/2014 1658   LABBENZ NEGATIVE 02/21/2010 2201   AMPHETMU NONE DETECTED 11/11/2014 1658   AMPHETMU NEGATIVE 02/21/2010 2201   THCU NONE DETECTED 11/11/2014 1658   LABBARB NONE DETECTED 11/11/2014 1658    Urinalysis:  Recent Labs Lab 11/11/14 1658  COLORURINE YELLOW  LABSPEC 1.015  PHURINE 7.0  GLUCOSEU >1000*  HGBUR TRACE*  BILIRUBINUR NEGATIVE  KETONESUR 15*  PROTEINUR NEGATIVE  UROBILINOGEN 0.2  NITRITE NEGATIVE  LEUKOCYTESUR NEGATIVE   Medications: I have reviewed the patient's current medications. Scheduled Meds: . antiseptic oral rinse  7 mL Mouth Rinse q12n4p  . chlorhexidine  15 mL Mouth Rinse BID  . enoxaparin (LOVENOX) injection  40 mg Subcutaneous Q24H  . feeding supplement (GLUCERNA SHAKE)  237 mL Oral TID BM  . insulin aspart  0-15 Units Subcutaneous TID WC  . insulin glargine  20 Units Subcutaneous QHS  . labetalol  100 mg Oral BID  . metoCLOPramide  10 mg Oral TID AC  . mometasone-formoterol  2 puff Inhalation BID  . pantoprazole  40 mg Oral Daily  . sodium chloride  3 mL Intravenous Q12H   Continuous Infusions:  PRN Meds:.acetaminophen **OR** acetaminophen, albuterol, ketorolac, ondansetron Assessment/Plan:  Persistent Nausea and vomiting with Abdominal Pain- Resolved.  Gastroparesis. Likely due chronic uncontrolled Diabtes. EGD- 2015- suggested Gastroparesis.  - Discharge home today. - TAlk to case manager about getting pt meds - Protonix 40mg  daily to PO - Reglan- 10mg  TID to PO - Gastric emptying study when stable or as outpt- will need to be off reglan and opioids. - Cocaine use may be contributing to symptoms - Follow up as outpt for orange  card.  Abdominal Pain- Resolved. left lower quadrant. - Follow up as outpt - Possible follow up with Gynae as outpt, says pain is worse with periods.  Poorly controlled DM- HGbA1c- 12.9- 4 weeks ago. Chronically uncontrolled Home meds- Lantus 50u daily with SSI. - Meal coverage insulin would be better- f/u as outpt. - Increase to SSI-M   Tachycardia- Improving, intermittently becomes tachycardic, as in prior admission, possibly related to cocaine use. - D/c tele    HTN- BP elevated. Resume home meds- Labetalol- 100mg  BID, - Resume home Bp meds- labetalol 100mg  BID  FEN- Repleted.  DVT ppx- Lovenox.  Dispo: Discharge today.   LOS: 4 days   Bethena Roys, MD 11/15/2014, 10:37 AM

## 2014-11-18 ENCOUNTER — Telehealth: Payer: Self-pay | Admitting: Internal Medicine

## 2014-11-18 NOTE — Telephone Encounter (Signed)
Call to patient to confirm appointment for 11/19/14 at 10:45 lmtcb

## 2014-11-19 ENCOUNTER — Encounter: Payer: Self-pay | Admitting: Internal Medicine

## 2014-11-19 ENCOUNTER — Ambulatory Visit: Payer: Medicaid Other | Admitting: Internal Medicine

## 2014-11-26 ENCOUNTER — Ambulatory Visit: Payer: Medicaid Other | Admitting: Internal Medicine

## 2014-11-27 ENCOUNTER — Encounter: Payer: Medicaid Other | Admitting: Internal Medicine

## 2014-12-22 ENCOUNTER — Ambulatory Visit (INDEPENDENT_AMBULATORY_CARE_PROVIDER_SITE_OTHER): Payer: Self-pay | Admitting: Internal Medicine

## 2014-12-22 ENCOUNTER — Encounter: Payer: Self-pay | Admitting: Internal Medicine

## 2014-12-22 VITALS — BP 159/101 | HR 96 | Temp 98.1°F | Wt 223.9 lb

## 2014-12-22 DIAGNOSIS — K3184 Gastroparesis: Secondary | ICD-10-CM

## 2014-12-22 DIAGNOSIS — E1143 Type 2 diabetes mellitus with diabetic autonomic (poly)neuropathy: Secondary | ICD-10-CM

## 2014-12-22 DIAGNOSIS — E1165 Type 2 diabetes mellitus with hyperglycemia: Secondary | ICD-10-CM

## 2014-12-22 DIAGNOSIS — Z597 Insufficient social insurance and welfare support: Secondary | ICD-10-CM

## 2014-12-22 DIAGNOSIS — E1142 Type 2 diabetes mellitus with diabetic polyneuropathy: Secondary | ICD-10-CM

## 2014-12-22 DIAGNOSIS — I10 Essential (primary) hypertension: Secondary | ICD-10-CM

## 2014-12-22 DIAGNOSIS — G8929 Other chronic pain: Secondary | ICD-10-CM

## 2014-12-22 DIAGNOSIS — R1032 Left lower quadrant pain: Secondary | ICD-10-CM

## 2014-12-22 DIAGNOSIS — E1140 Type 2 diabetes mellitus with diabetic neuropathy, unspecified: Secondary | ICD-10-CM

## 2014-12-22 LAB — HM DIABETES EYE EXAM

## 2014-12-22 LAB — GLUCOSE, CAPILLARY: GLUCOSE-CAPILLARY: 268 mg/dL — AB (ref 65–99)

## 2014-12-22 NOTE — Assessment & Plan Note (Signed)
Patient with continued nausea and vomiting likely secondary to her gastroparesis from uncontrolled diabetes. Very difficult situation as patient has lost her Medicaid and is unable to afford any of her medications. She has not taken any medications for the past month, including insulin and Reglan. She will need a gastric emptying study eventually, but getting her access to insulin to control her diabetes is the most important factor at this time.  - Spoke with SW- patient instructed to get set up with MAP program at [facility] Dept. Patient to go there today to start process - Patient to meet with Marlana Latus for possible orange card - Patient given Novolog pen, pen needles, and syringes to help with glucose control - Will try to get Reglan restarted once able to have some medication assistance - Schedule gastric emptying study once has insurance

## 2014-12-22 NOTE — Patient Instructions (Signed)
-   Go to [place] to get set up with MAP program - You need to be restarted on your medications right away - Referral to gynecology   General Instructions:   Please bring your medicines with you each time you come to clinic.  Medicines may include prescription medications, over-the-counter medications, herbal remedies, eye drops, vitamins, or other pills.   Progress Toward Treatment Goals:  Treatment Goal 02/18/2014  Hemoglobin A1C unchanged  Blood pressure at goal    Self Care Goals & Plans:  Self Care Goal 02/18/2014  Manage my medications take my medicines as prescribed; bring my medications to every visit; refill my medications on time  Monitor my health keep track of my blood glucose; bring my glucose meter and log to each visit  Eat healthy foods drink diet soda or water instead of juice or soda; eat more vegetables; eat foods that are low in salt; eat baked foods instead of fried foods; eat fruit for snacks and desserts  Be physically active -    Home Blood Glucose Monitoring 02/18/2014  Check my blood sugar -  When to check my blood sugar before breakfast; before lunch; before dinner     Care Management & Community Referrals:  No flowsheet data found.

## 2014-12-22 NOTE — Assessment & Plan Note (Signed)
Lab Results  Component Value Date   HGBA1C 12.9* 10/13/2014   HGBA1C 11.1* 07/21/2014   HGBA1C 10.3 02/18/2014     Assessment: Comments: DM remains uncontrolled as patient no longer has insurance and has not been able to take insulin for the past 1 month due to being unable to afford any of her medications. She was most recently hospitalized at [facility] in July and [facility] for DKA and persistent N/V related to gastroparesis, respectively. She was given Lantus pens but no pen needles at her [facility] hospitalization. Very difficult social situation. Will set up with MAP program and give insulin sample in meantime to try to get blood sugars back under control.   Plan: Medications:  Patient given Novolog pen, pen needles, and syringes. Instructed to take Lantus 24 units QHS and Novolog 2 units TID with meals as patient is unable to test her blood sugar due to not having any testing supplies.  Instruction/counseling given: reminded to bring blood glucose meter & log to each visit, reminded to bring medications to each visit and discussed diet Other plans:  - Patient to go to Health Dept today to get set up with MAP program - Follow up in 2 weeks to ensure she has been plugged in with MAP

## 2014-12-22 NOTE — Assessment & Plan Note (Signed)
BP Readings from Last 3 Encounters:  12/22/14 159/101  11/15/14 112/61  10/15/14 146/76    Lab Results  Component Value Date   NA 135 11/13/2014   K 3.7 11/13/2014   CREATININE 1.01* 11/13/2014    Assessment: Comments: BP mildly elevated today. Patient has not taken Labetalol in over a month due to losing her Medicaid. Unable to afford any medications currently.   Plan: Medications:  Restart Labetalol 100 mg BID once able to afford medications Other plans:  - Patient instructed to go to Health Dept TODAY to get set up with MAP program

## 2014-12-22 NOTE — Progress Notes (Signed)
   Subjective:    Patient ID: Shirley [name], female    DOB: 1975-06-15, 39 y.o.   MRN: [id number]  HPI Ms. Genson is a 39yo woman with PMHx of HTN, chronic ischemic heart disease, type 2 DM with gastroparesis, and depression who presents today for a hospital follow up appointment.   She was hospitalized at [facility] from 8/24-8/26 for abdominal pain, nausea, and vomiting determined to be secondary to her gastroparesis from uncontrolled DM. Additionally, she was hospitalized at [facility] from 7/20-7/24 for DKA and persistent nausea/vomiting related to her gastroparesis. Patient was supposed to receive a gastric emptying study during this hospitalization but it was never performed. She admits that she has not taken any of her medications for the last month (including insulin) because she lost her Medicaid and is unable to afford any of her medications. She received 2 Lantus pens at [facility] but had no pen needles so has been unable to take her insulin. She notes continued abdominal pain, nausea, and vomiting. She reports she was told that she had fibroids while at [facility] and would like a referral to [facility].    Review of Systems General: Reports fatigue. Denies fever, chills, night sweats, changes in weight, changes in appetite HEENT: Denies headaches, ear pain, changes in vision, rhinorrhea, sore throat CV: Reports palpitations- has not taken Labetalol in over a month. Denies CP, SOB, orthopnea Pulm: Denies SOB, cough, wheezing GI: Reports left-sided abdominal pain, nausea and vomiting- chronic. Denies diarrhea, constipation, melena, hematochezia GU: Denies dysuria, hematuria, frequency Msk: Denies muscle cramps, joint pains Neuro: Denies weakness, numbness, tingling Skin: Denies rashes, bruising    Objective:   Physical Exam General: alert, sitting up in chair, NAD HEENT: [place]/AT, EOMI, sclera anicteric, mucus membranes moist CV: RRR, no m/g/r Pulm: CTA bilaterally, breaths  non-labored Abd: BS+, soft, obese, mild tenderness to palpation of the LLQ Ext: warm, no peripheral edema Neuro: alert and oriented x 3     Assessment & Plan:  Please refer to A&P documentation.

## 2014-12-22 NOTE — Assessment & Plan Note (Signed)
Patient with hx of chronic left lower quadrant abdominal pain. She was noted to have fibroids during her most recent hospitalization. Could be source of both her abdominal pain and iron deficiency anemia. Will refer to OBGYN to see what options they can offer patient.  - OBGYN referral placed

## 2014-12-29 ENCOUNTER — Encounter: Payer: Self-pay | Admitting: Dietician

## 2014-12-29 NOTE — Progress Notes (Signed)
Internal Medicine Clinic Attending  Case discussed with Dr. Rivet soon after the resident saw the patient.  We reviewed the resident's history and exam and pertinent patient test results.  I agree with the assessment, diagnosis, and plan of care documented in the resident's note.  

## 2015-01-23 IMAGING — CR DG ABDOMEN ACUTE W/ 1V CHEST
4 series · 4 of 4 positions shown · non-contrast
Comparison: Chest and acute abdomen of 02/13/2012

CLINICAL DATA: Emesis, abdominal pain

ACUTE ABDOMEN SERIES (ABDOMEN 2 VIEW & CHEST 1 VIEW)

[w chest pa]
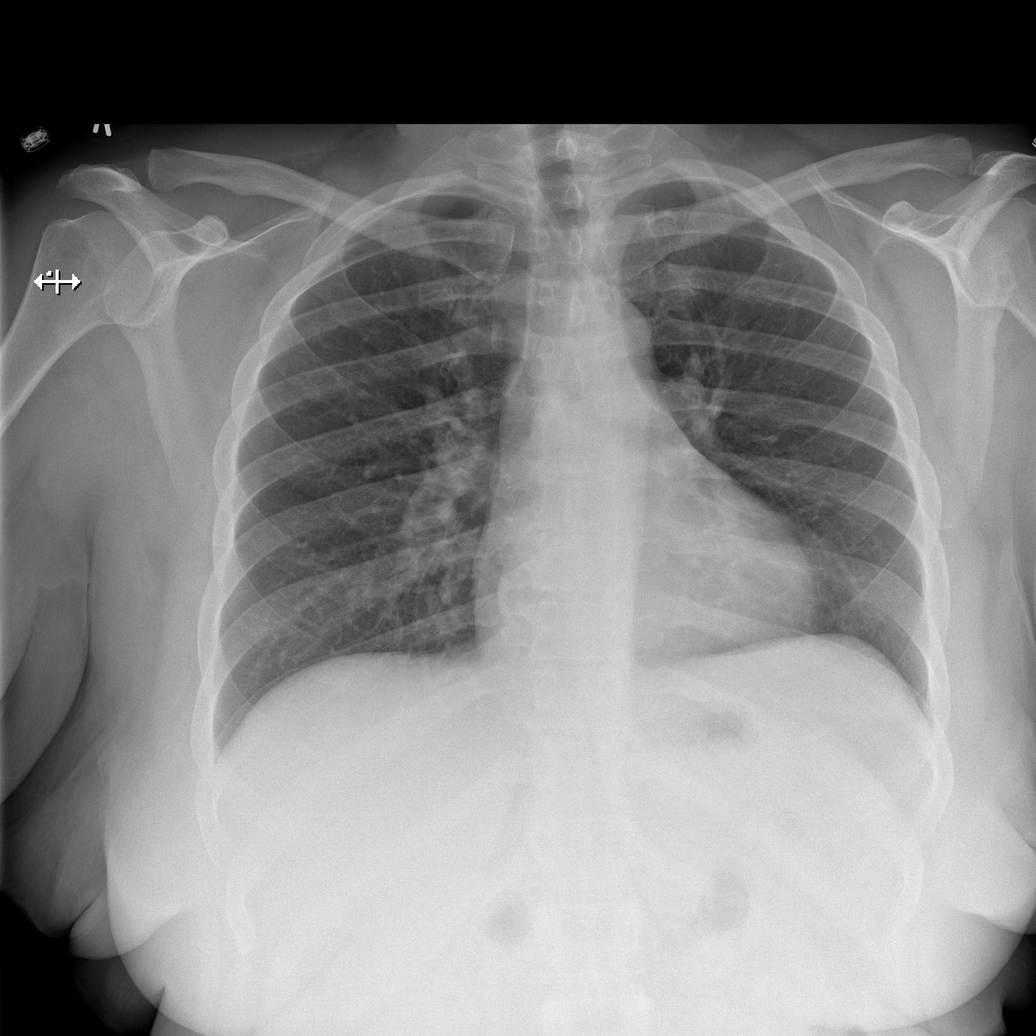

[w abdomen upright]
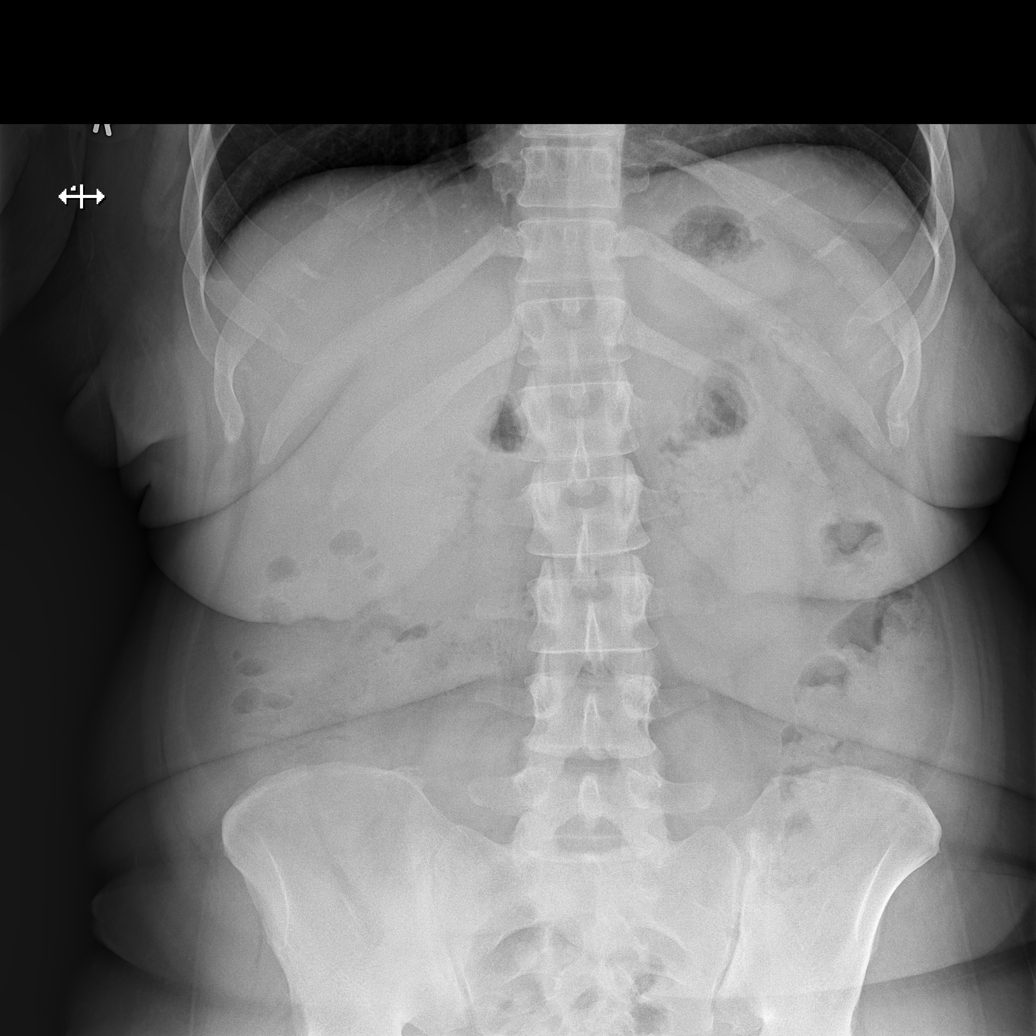

[t abdomen supine (1 of 2)]
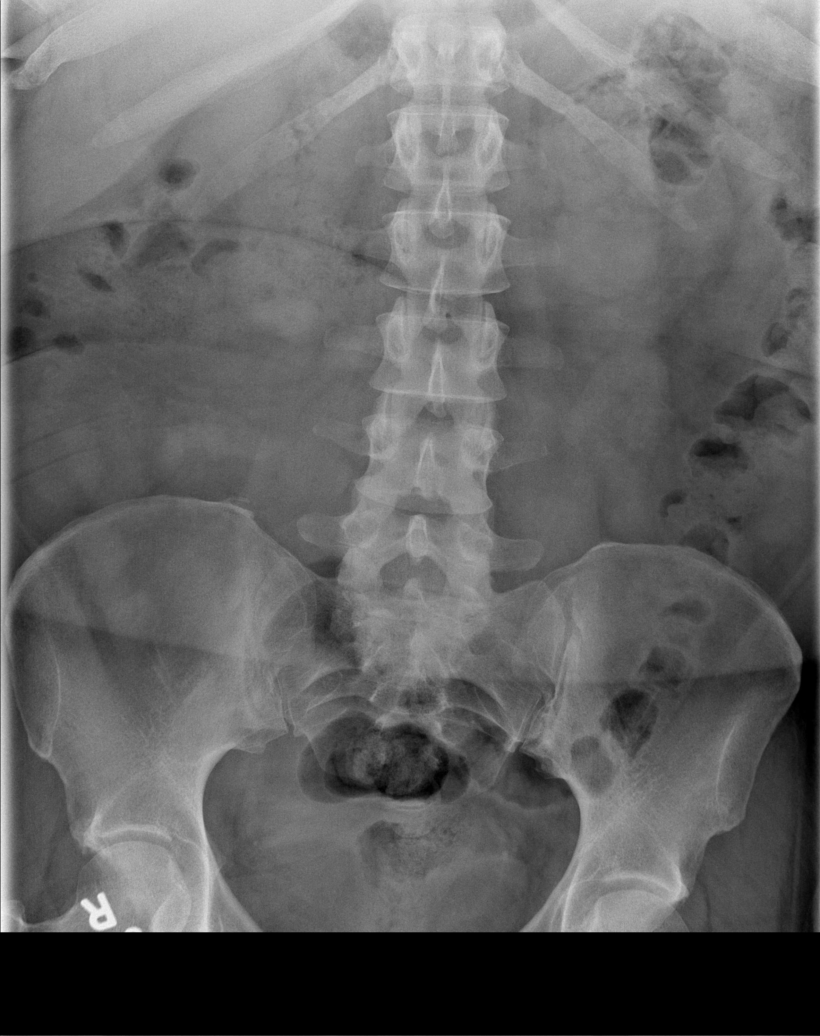

[t abdomen supine (2 of 2)]
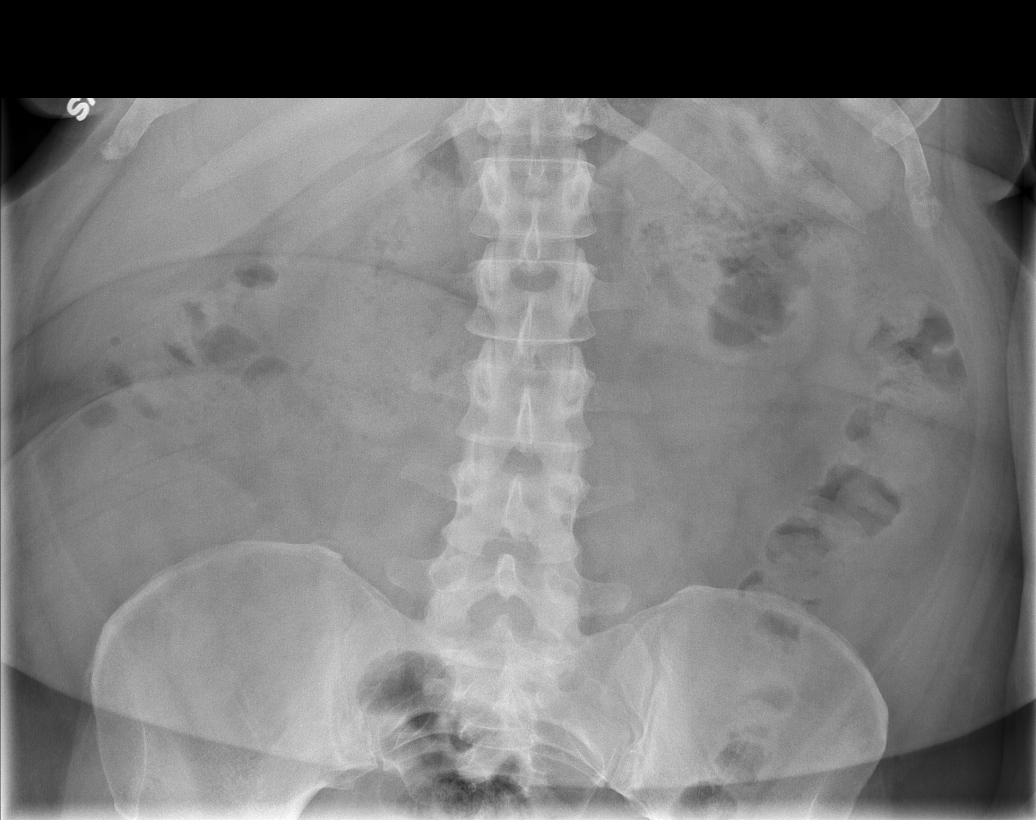

[4 of 4 positions shown; findings below may reference images not displayed]

FINDINGS: The lungs are clear.  Mediastinal contours are stable.
The heart is within normal limits in size.  No skeletal abnormality
is seen.

Supine and erect views of the abdomen show no bowel obstruction.
No free air is noted.  No opaque calculi are seen.
IMPRESSION: 1.  No active lung disease.
2.  No bowel obstruction.  No free air.

## 2015-01-24 IMAGING — CT CT HEAD WO/W CM
1 of 2 series · 13 of 30 positions shown, 17 images · IV contrast (omnipaque)
Comparison: 11/15/2011.

CLINICAL DATA: Chronic nausea with vomiting.  Evaluate for
intracranial lesion. History of multiple substance abuse.

CT HEAD WITHOUT AND WITH CONTRAST
TECHNIQUE: Contiguous axial images were obtained from the base of
the skull through the vertex without and with intravenous contrast.
Contrast: 75mL OMNIPAQUE IOHEXOL 300 MG/ML  SOLN

[Series 2: head routine 4.8 h37s · axial · 0.45mm/px · z∈[-80,+38]mm · 13 of 30 slices shown, 17 images]
[im 3/30  brain]
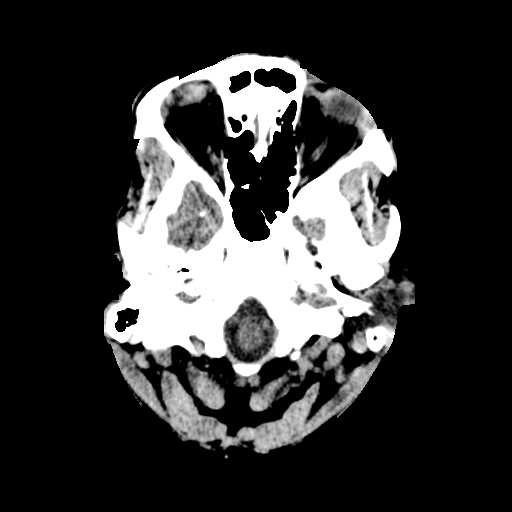
[im 3/30  bone]
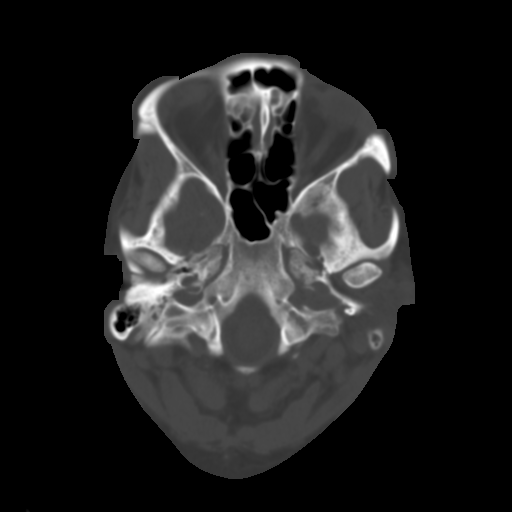
[im 5/30  brain]
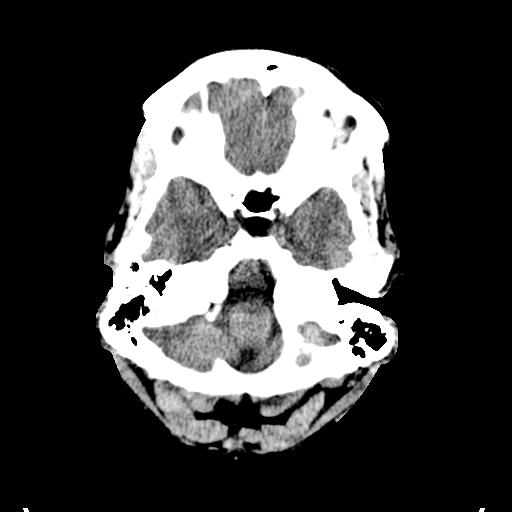
[im 7/30  brain]
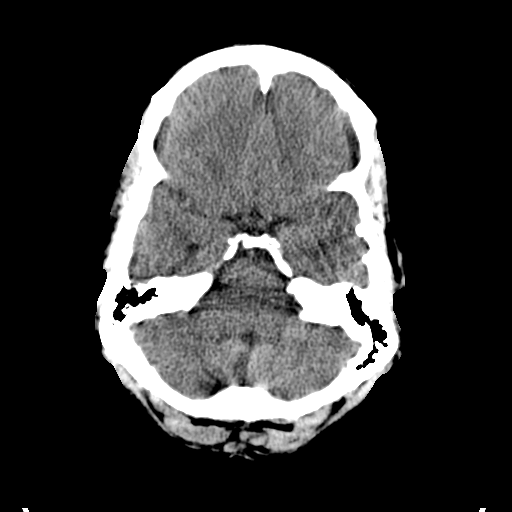
[im 9/30  brain]
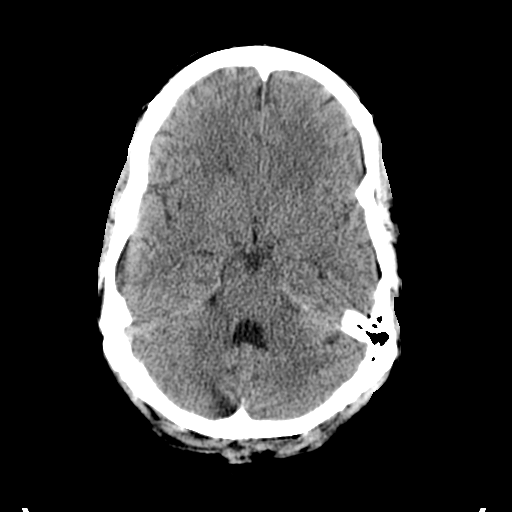
[im 11/30  brain]
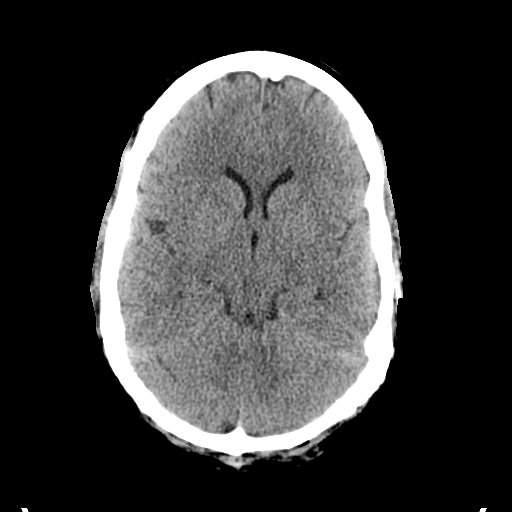
[im 11/30  bone]
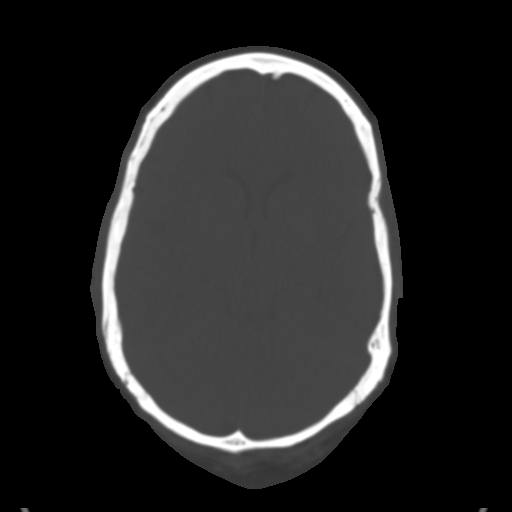
[im 13/30  brain]
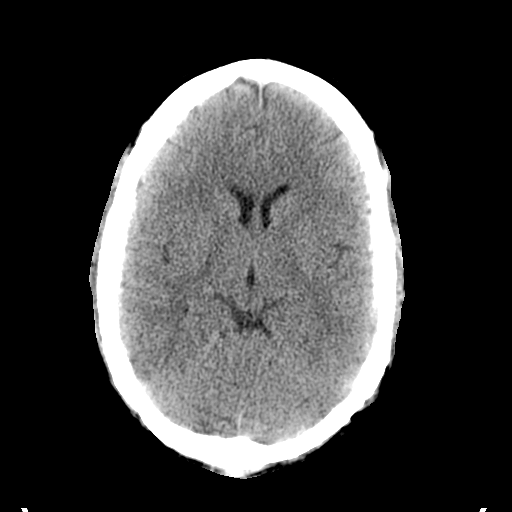
[im 15/30  brain]
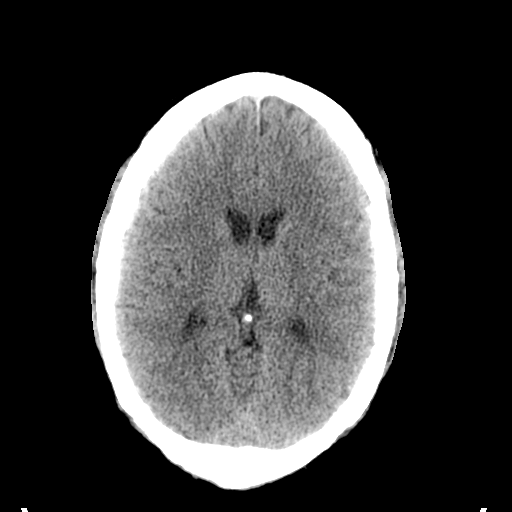
[im 17/30  brain]
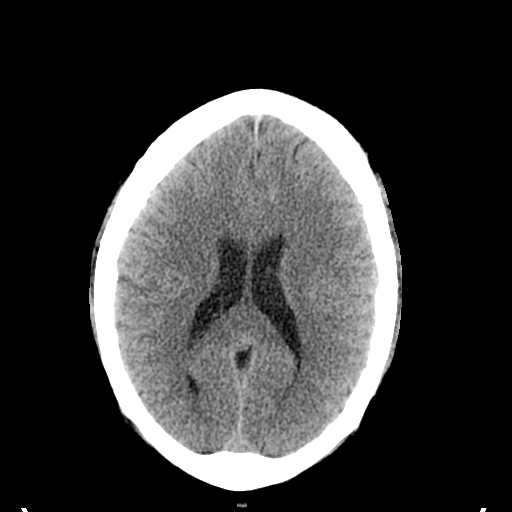
[im 19/30  brain]
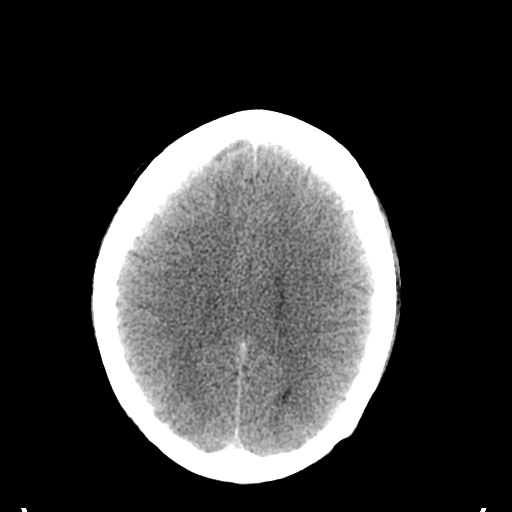
[im 19/30  bone]
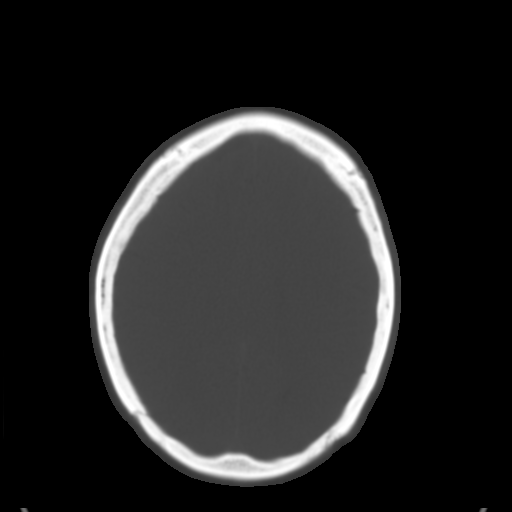
[im 21/30  brain]
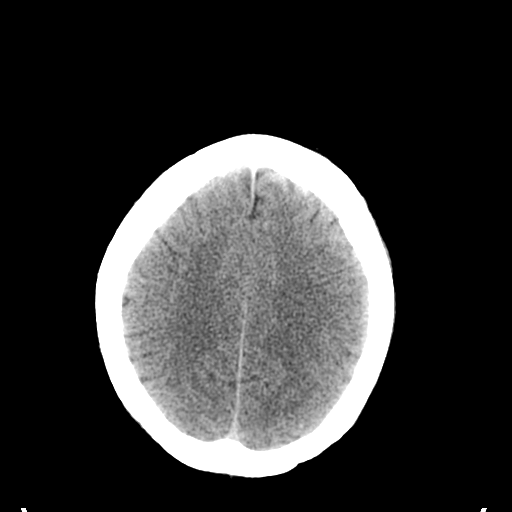
[im 23/30  brain]
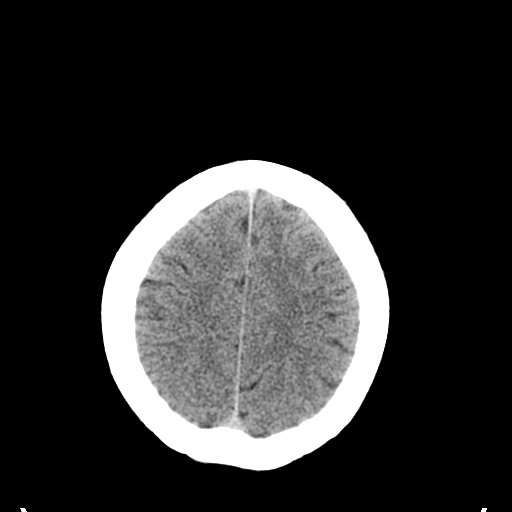
[im 25/30  brain]
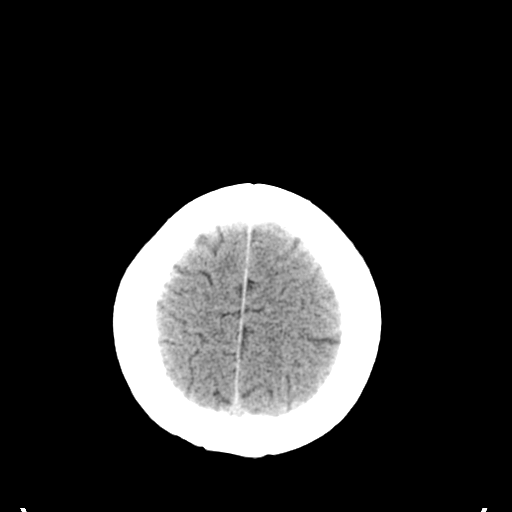
[im 27/30  brain]
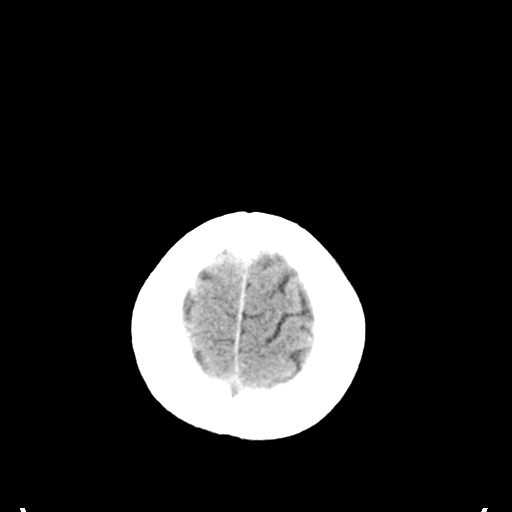
[im 27/30  bone]
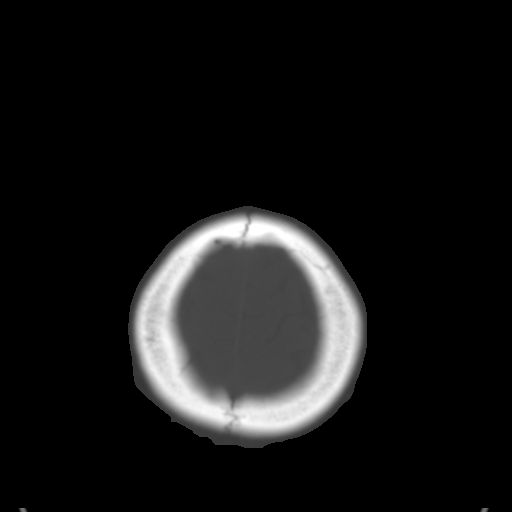

[13 of 30 positions shown; findings below may reference images not displayed]

FINDINGS: There is no evidence for acute infarction, intracranial
hemorrhage, mass lesion, hydrocephalus, or extra-axial fluid.  No
atrophy or white matter disease.  Post contrast, no abnormal
intracranial enhancement.  Remote right cerebellar infarct is
unchanged.   Calvarium intact.  Clear sinuses and mastoids.
IMPRESSION: Remote right cerebellar infarct.  Otherwise negative scan.

## 2015-02-05 IMAGING — CR DG ABDOMEN ACUTE W/ 1V CHEST
3 series · 3 of 3 positions shown · non-contrast
Comparison: 06/19/2012

CLINICAL DATA: Vomiting, left lower abdominal pain

ACUTE ABDOMEN SERIES (ABDOMEN 2 VIEW & CHEST 1 VIEW)

[w chest pa]
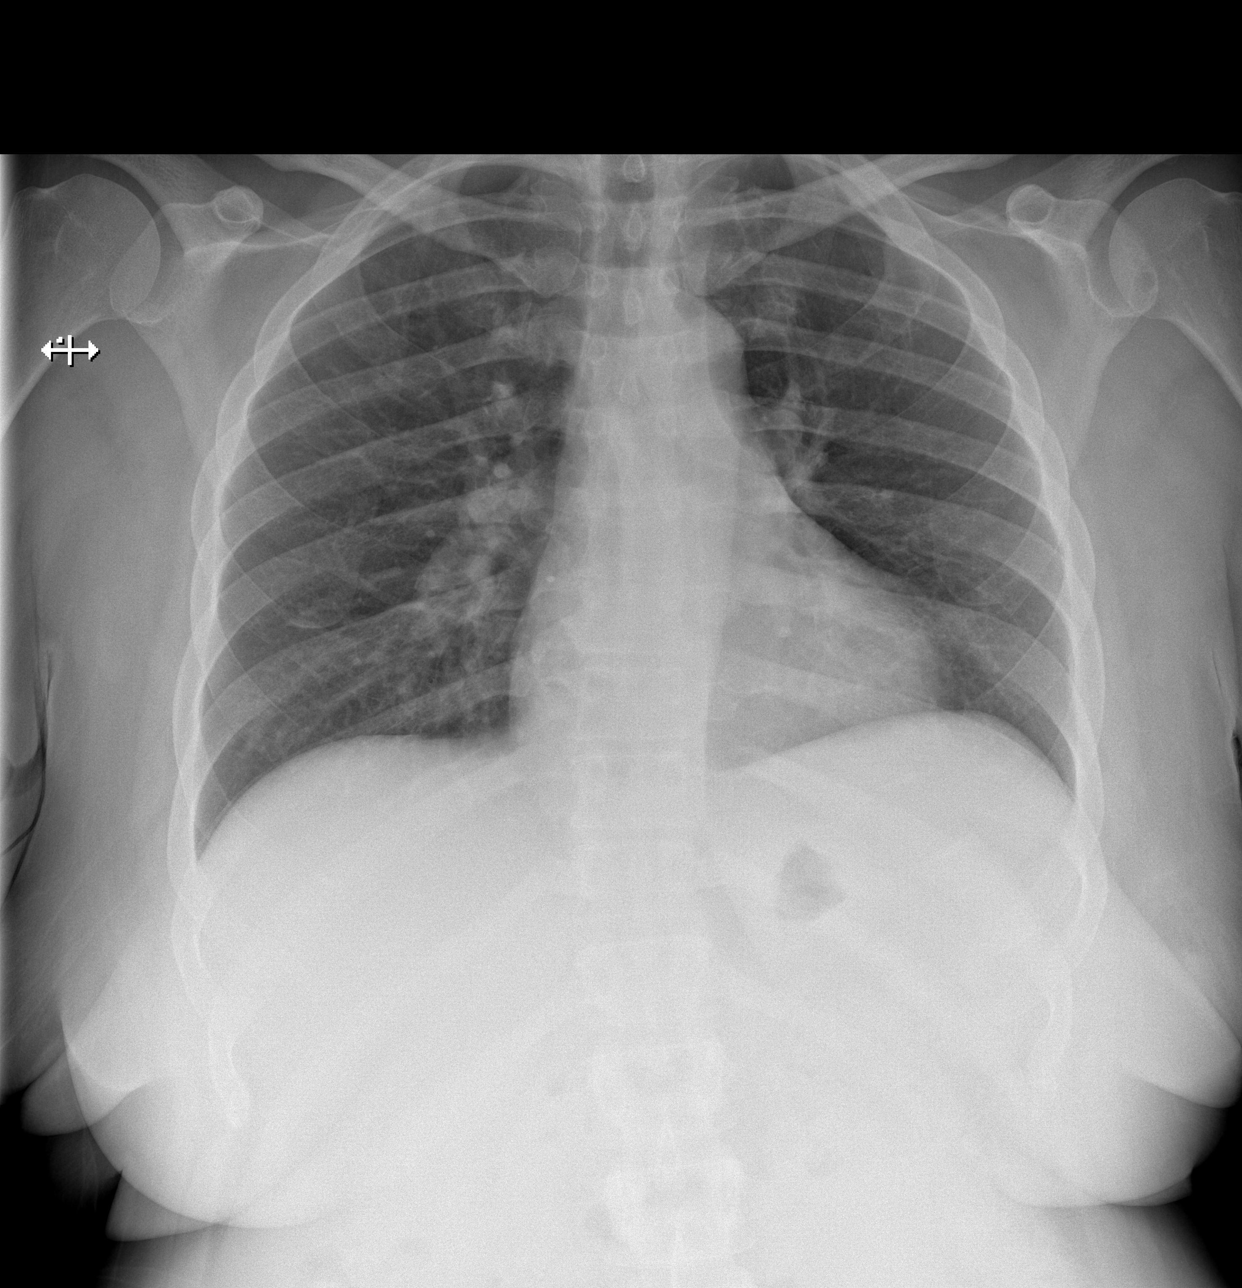

[w abdomen upright]
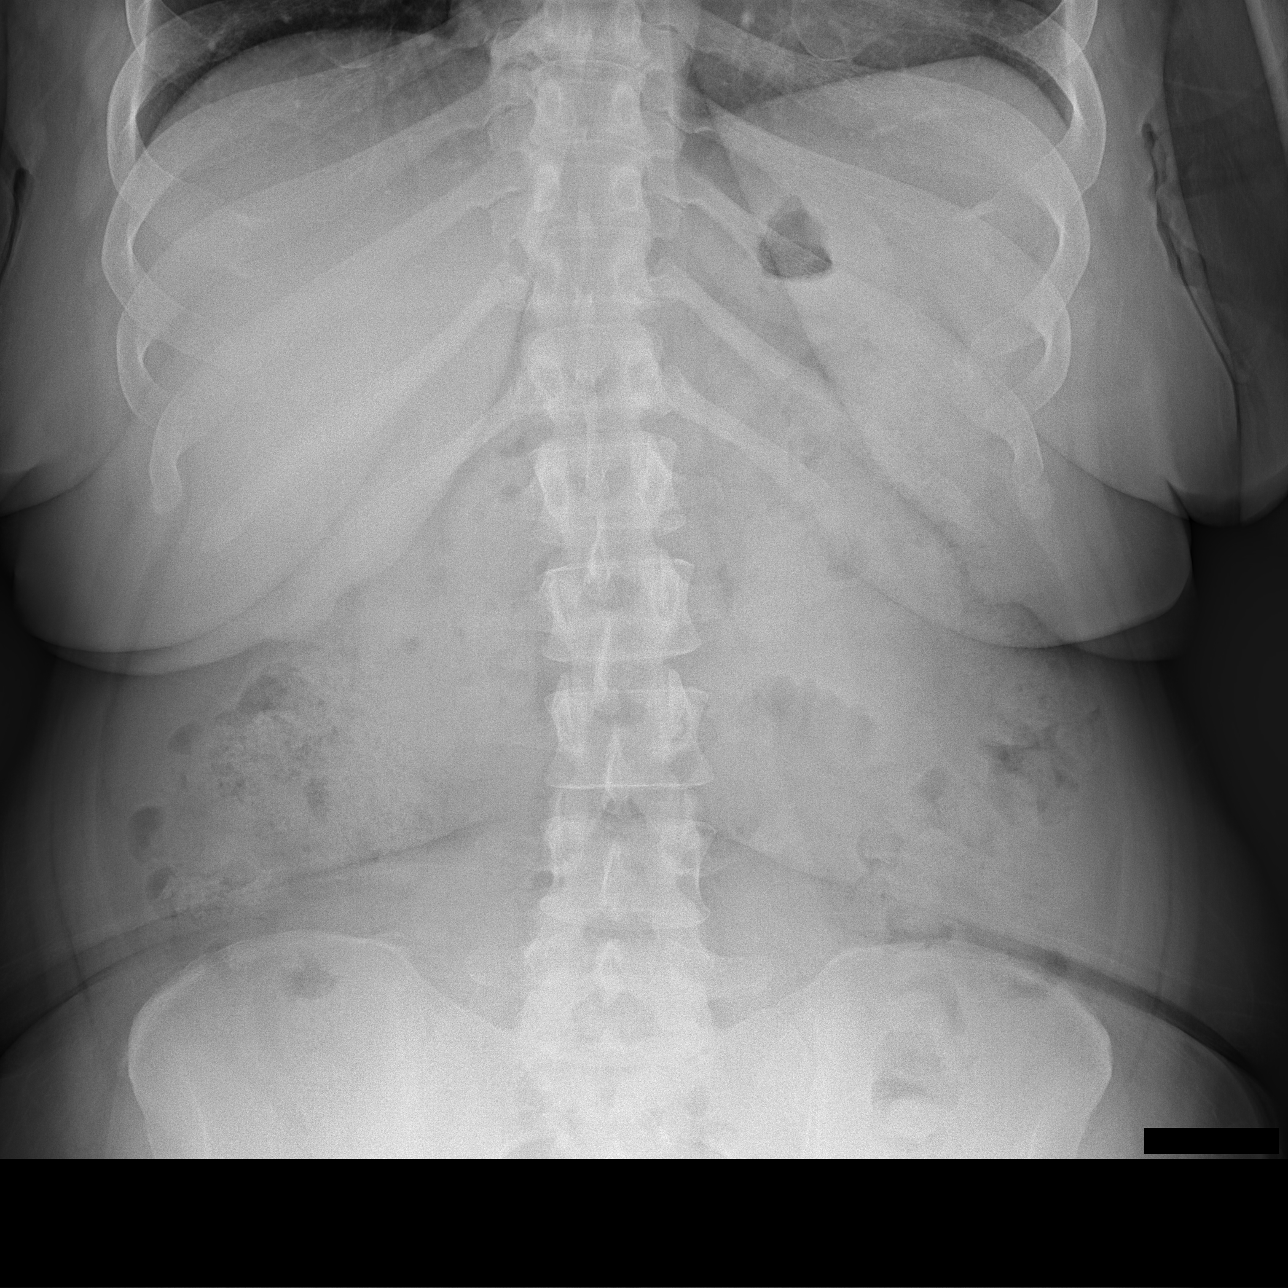

[t abdomen supine]
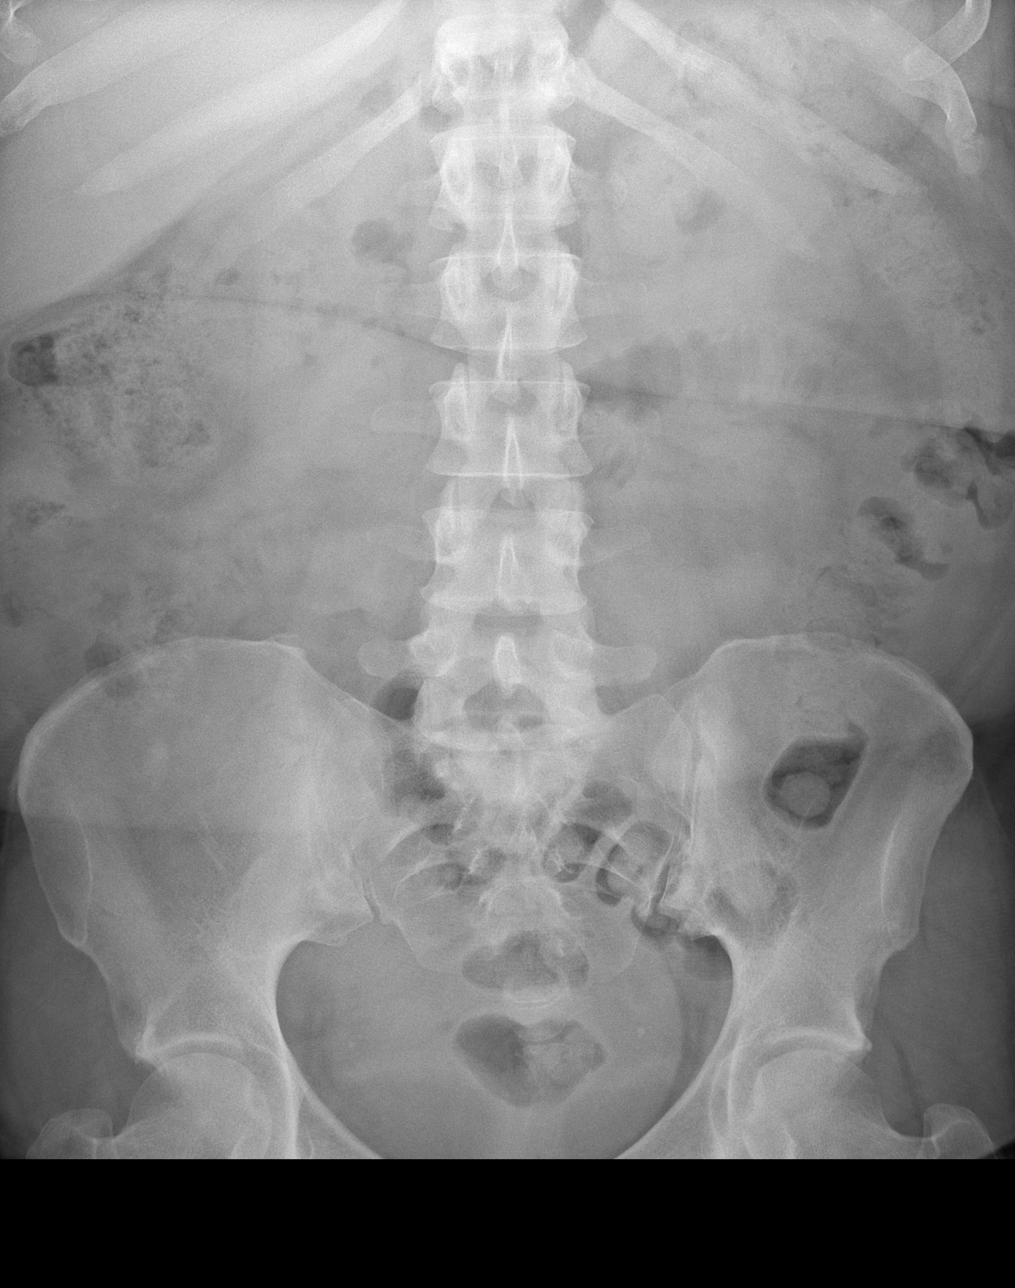

[3 of 3 positions shown; findings below may reference images not displayed]

FINDINGS: Cardiomediastinal silhouette is within normal limits. The
lungs are clear. No pleural effusion.  No pneumothorax.  No acute
osseous abnormality.  No free air beneath the diaphragms.

A single loop of small bowel over the left mid abdomen measures
mildly dilated at 3.1 cm.  Otherwise, no differential air-fluid
level or bowel dilatation is identified.  No abnormal calcific
opacity.  No acute osseous finding.
IMPRESSION: Nonspecific single mildly dilated left mid abdominal small bowel
loop.  This could indicate focal ileus or early/partial small bowel
obstruction.

## 2015-03-11 ENCOUNTER — Telehealth: Payer: Self-pay | Admitting: Internal Medicine

## 2015-03-11 NOTE — Telephone Encounter (Signed)
Called pt, states she has been sick- lower L abd pain, throwing up, shaking, not able to keep anything down for 2 days. She is advised to go to [place] asap, she is agreeable

## 2015-03-11 NOTE — Telephone Encounter (Signed)
Pt states she is weak, unable to eat and not feeling well. Pt requesting the nurse to call back.

## 2015-03-11 NOTE — Telephone Encounter (Signed)
I agree with your plan. She needs to be evaluated ASAP. Thank you Bonnita Nasuti

## 2015-03-21 IMAGING — CT CT ABD-PELV W/O CM
1 series · 14 of 21 positions shown, 19 images · non-contrast
Comparison: 12/17/2011

CLINICAL DATA: Nausea, emesis.

CT ABDOMEN AND PELVIS WITHOUT CONTRAST
TECHNIQUE: Multidetector CT imaging of the abdomen and pelvis was
performed following the standard protocol without intravenous
contrast.

[Series 6: lung · axial · 0.92mm/px · z∈[+242,+332]mm · 14 of 21 slices shown, 19 images]
[im 2/21  soft-tissue]
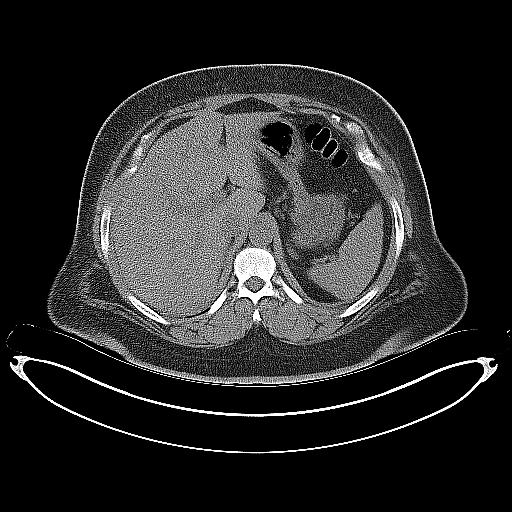
[im 2/21  bone]
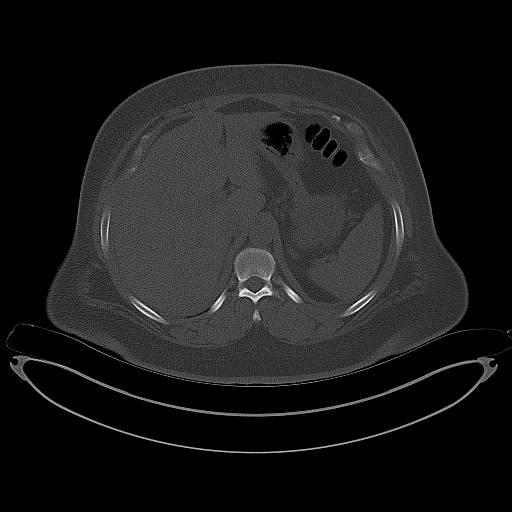
[im 4/21  soft-tissue]
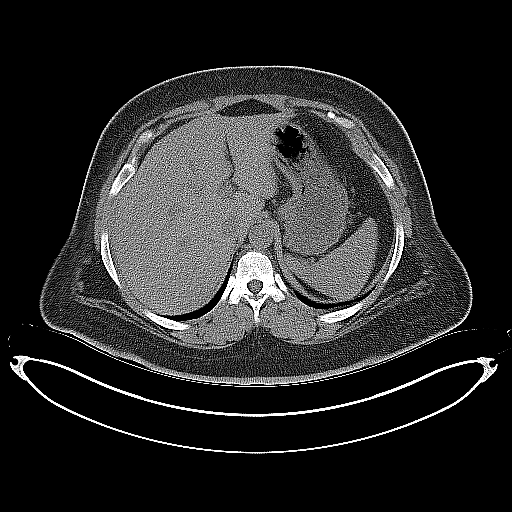
[im 5/21  soft-tissue]
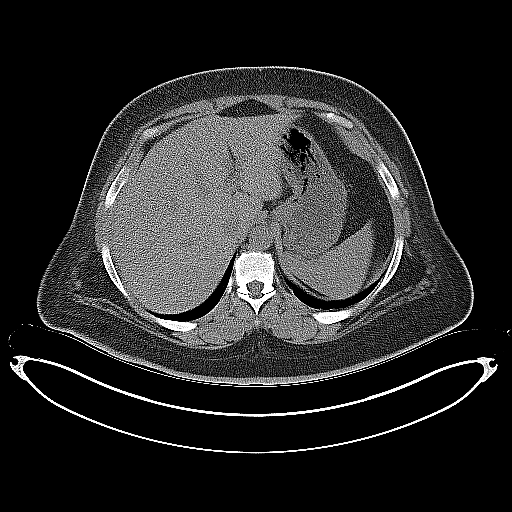
[im 7/21  soft-tissue]
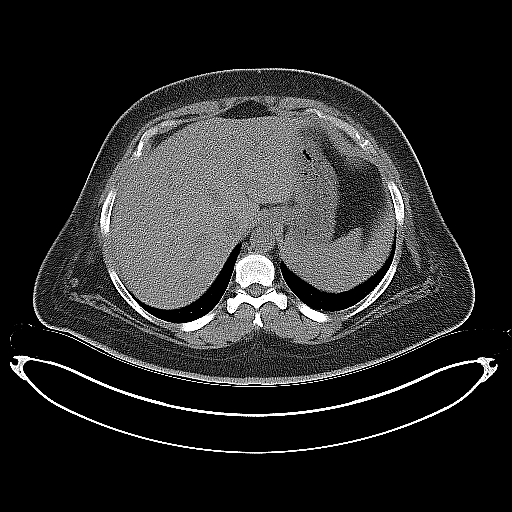
[im 8/21  soft-tissue]
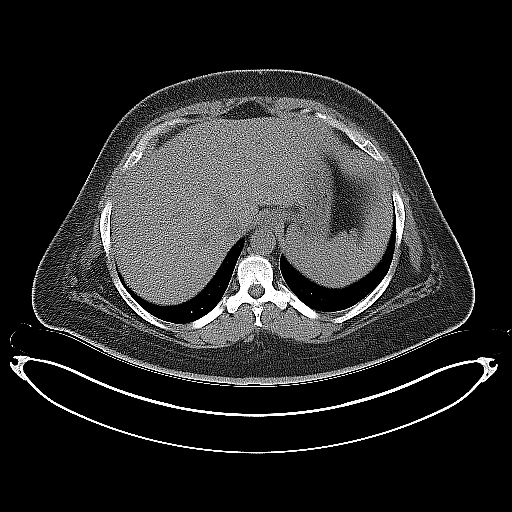
[im 10/21  soft-tissue]
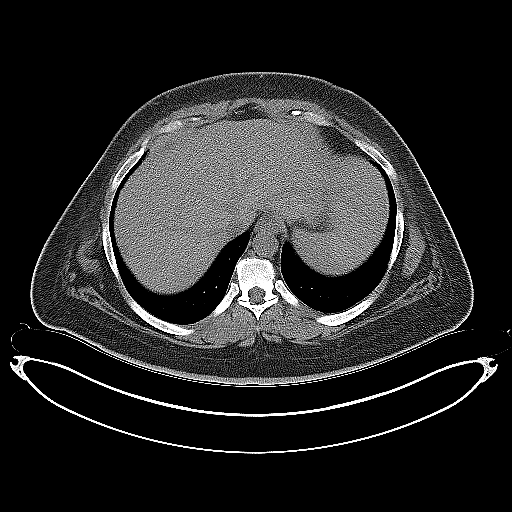
[im 11/21  soft-tissue]
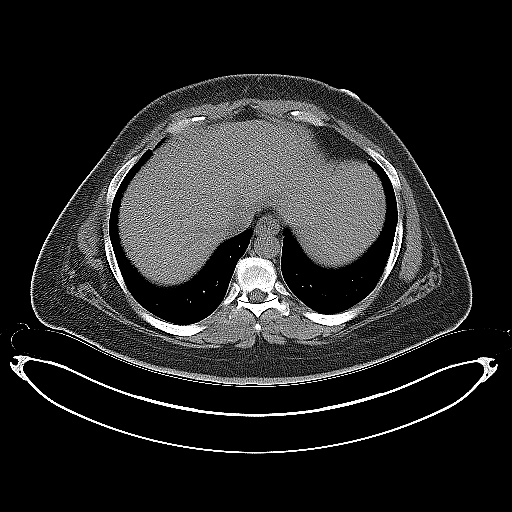
[im 12/21  soft-tissue]
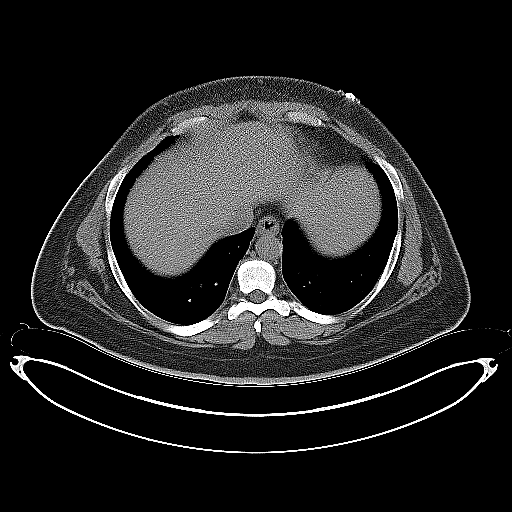
[im 14/21  soft-tissue]
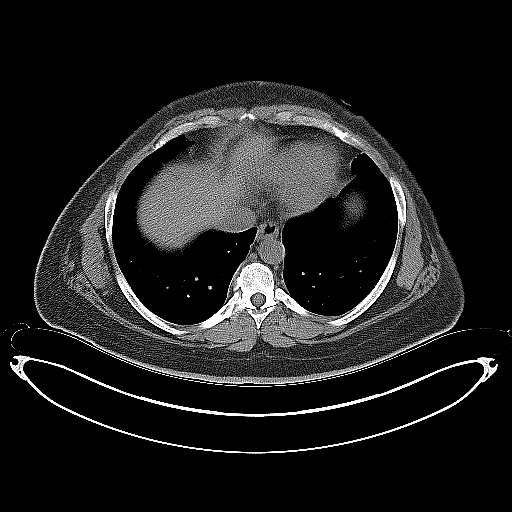
[im 14/21  bone]
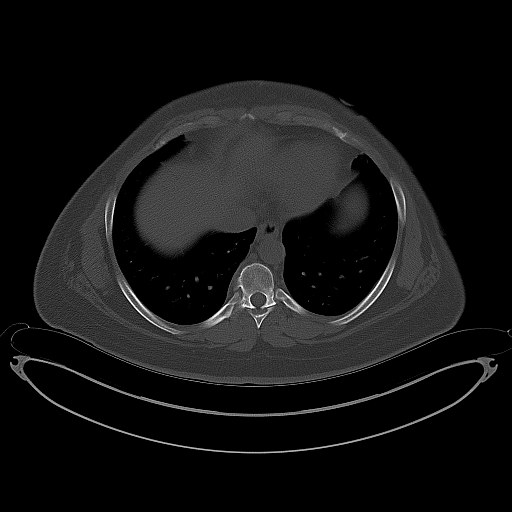
[im 15/21  soft-tissue]
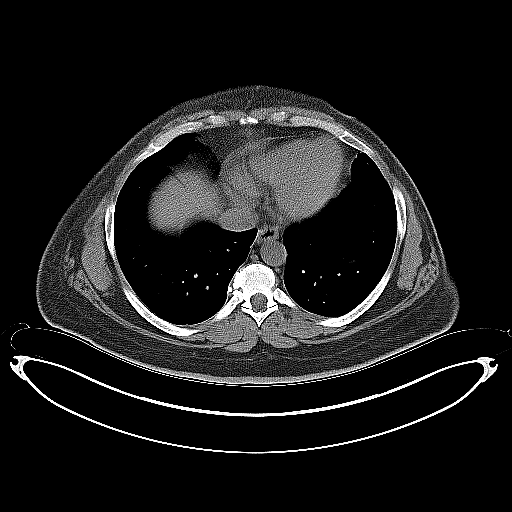
[im 17/21  soft-tissue]
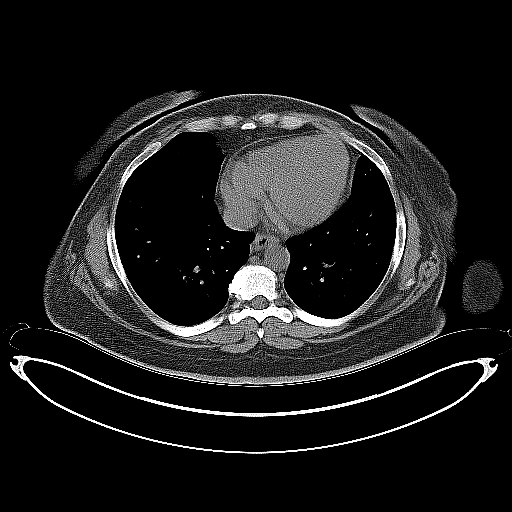
[im 17/21  lung]
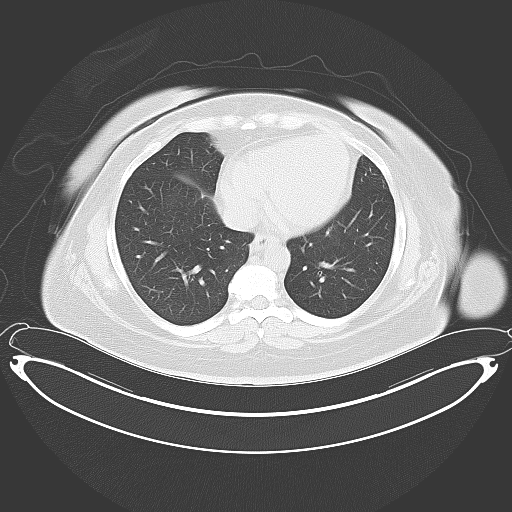
[im 18/21  soft-tissue]
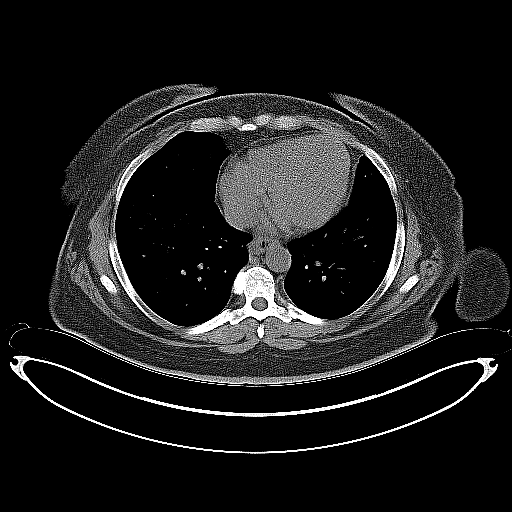
[im 18/21  lung]
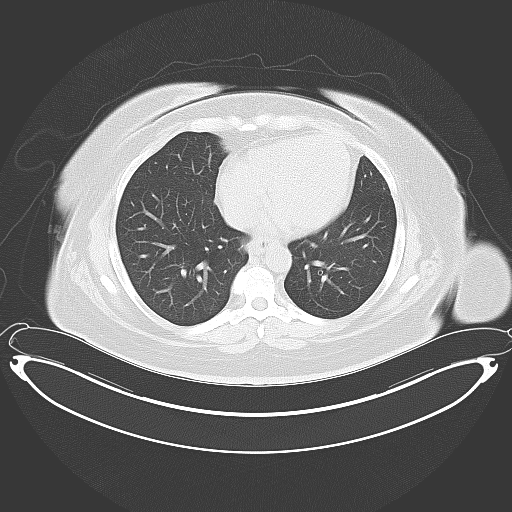
[im 19/21  lung]
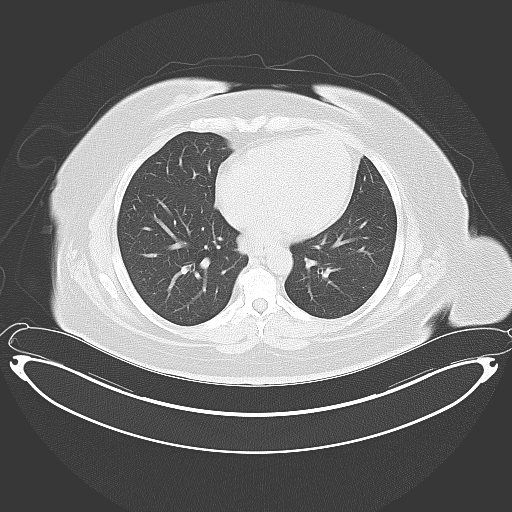
[im 20/21  soft-tissue]
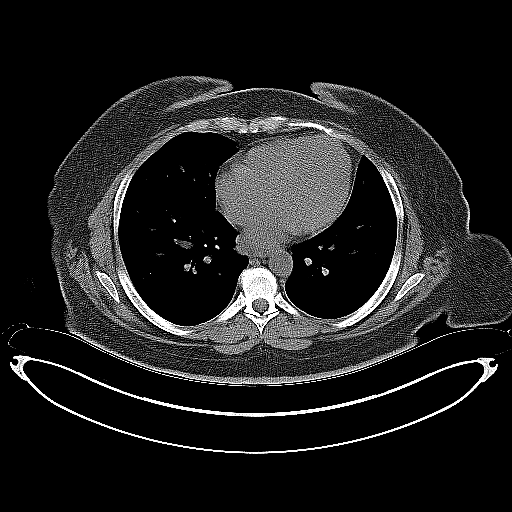
[im 20/21  lung]
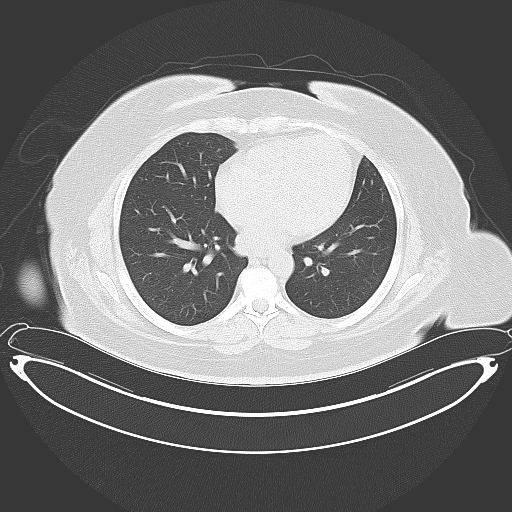

[14 of 21 positions shown; findings below may reference images not displayed]

FINDINGS: Limited images through the lung bases demonstrate no
significant appreciable abnormality. The heart size is within
normal limits. No pleural or pericardial effusion.

Organ abnormality/lesion detection is limited in the absence of
intravenous contrast. Within this limitation, unremarkable liver,
spleen, pancreas, biliary system, adrenal glands.

Symmetric renal size.  The no hydronephrosis or hydroureter.  No
urinary tract calculi.

No CT evidence for colitis.  Normal appendix.  Tiny hiatal hernia
and mild thickening of the distal esophagus again noted.  Small
bowel loops are normal course and caliber.

No free intraperitoneal air or fluid.  No lymphadenopathy.

Normal caliber aorta and branch vessels.

Thin-walled bladder.  Probable fibroid uterus. Trace
fluid/stranding within the pelvis.  Pelvic sidewall lymph nodes
measuring up to 1 cm short axis, nonspecific.

L5 S1 degenerative disc disease and disc bulge resulting in
moderate central canal narrowing.  No acute osseous finding.
IMPRESSION: No acute process identified by unenhanced CT.

Fibroid uterus suspected.  Trace fluid within the pelvis may be
physiologic. Correlate with pelvic ultrasound if concerned for
acute pelvic pathology.

## 2015-03-26 ENCOUNTER — Observation Stay (HOSPITAL_COMMUNITY)
Admission: EM | Admit: 2015-03-26 | Discharge: 2015-03-28 | Disposition: A | Discharge status: Home / Self Care | Payer: Self-pay | Attending: Internal Medicine | Admitting: Internal Medicine

## 2015-03-26 ENCOUNTER — Encounter (HOSPITAL_COMMUNITY): Payer: Self-pay | Admitting: Family Medicine

## 2015-03-26 ENCOUNTER — Telehealth: Payer: Self-pay

## 2015-03-26 DIAGNOSIS — I252 Old myocardial infarction: Secondary | ICD-10-CM | POA: Insufficient documentation

## 2015-03-26 DIAGNOSIS — K219 Gastro-esophageal reflux disease without esophagitis: Secondary | ICD-10-CM | POA: Insufficient documentation

## 2015-03-26 DIAGNOSIS — M797 Fibromyalgia: Secondary | ICD-10-CM | POA: Insufficient documentation

## 2015-03-26 DIAGNOSIS — I2510 Atherosclerotic heart disease of native coronary artery without angina pectoris: Secondary | ICD-10-CM | POA: Insufficient documentation

## 2015-03-26 DIAGNOSIS — R1032 Left lower quadrant pain: Secondary | ICD-10-CM | POA: Insufficient documentation

## 2015-03-26 DIAGNOSIS — R112 Nausea with vomiting, unspecified: Secondary | ICD-10-CM | POA: Diagnosis present

## 2015-03-26 DIAGNOSIS — Z79899 Other long term (current) drug therapy: Secondary | ICD-10-CM | POA: Insufficient documentation

## 2015-03-26 DIAGNOSIS — E860 Dehydration: Secondary | ICD-10-CM | POA: Insufficient documentation

## 2015-03-26 DIAGNOSIS — D509 Iron deficiency anemia, unspecified: Secondary | ICD-10-CM | POA: Insufficient documentation

## 2015-03-26 DIAGNOSIS — I10 Essential (primary) hypertension: Secondary | ICD-10-CM | POA: Insufficient documentation

## 2015-03-26 DIAGNOSIS — F419 Anxiety disorder, unspecified: Secondary | ICD-10-CM | POA: Insufficient documentation

## 2015-03-26 DIAGNOSIS — K920 Hematemesis: Secondary | ICD-10-CM | POA: Insufficient documentation

## 2015-03-26 DIAGNOSIS — G4733 Obstructive sleep apnea (adult) (pediatric): Secondary | ICD-10-CM | POA: Insufficient documentation

## 2015-03-26 DIAGNOSIS — K3184 Gastroparesis: Secondary | ICD-10-CM | POA: Insufficient documentation

## 2015-03-26 DIAGNOSIS — Z794 Long term (current) use of insulin: Secondary | ICD-10-CM | POA: Insufficient documentation

## 2015-03-26 DIAGNOSIS — E1143 Type 2 diabetes mellitus with diabetic autonomic (poly)neuropathy: Principal | ICD-10-CM | POA: Insufficient documentation

## 2015-03-26 DIAGNOSIS — E1165 Type 2 diabetes mellitus with hyperglycemia: Secondary | ICD-10-CM | POA: Insufficient documentation

## 2015-03-26 DIAGNOSIS — E785 Hyperlipidemia, unspecified: Secondary | ICD-10-CM | POA: Insufficient documentation

## 2015-03-26 DIAGNOSIS — D72829 Elevated white blood cell count, unspecified: Secondary | ICD-10-CM | POA: Insufficient documentation

## 2015-03-26 DIAGNOSIS — F329 Major depressive disorder, single episode, unspecified: Secondary | ICD-10-CM | POA: Insufficient documentation

## 2015-03-26 DIAGNOSIS — Z9181 History of falling: Secondary | ICD-10-CM | POA: Insufficient documentation

## 2015-03-26 DIAGNOSIS — R000 Tachycardia, unspecified: Secondary | ICD-10-CM | POA: Insufficient documentation

## 2015-03-26 DIAGNOSIS — Z87891 Personal history of nicotine dependence: Secondary | ICD-10-CM | POA: Insufficient documentation

## 2015-03-26 DIAGNOSIS — I259 Chronic ischemic heart disease, unspecified: Secondary | ICD-10-CM | POA: Insufficient documentation

## 2015-03-26 DIAGNOSIS — Z885 Allergy status to narcotic agent status: Secondary | ICD-10-CM | POA: Insufficient documentation

## 2015-03-26 DIAGNOSIS — Z91012 Allergy to eggs: Secondary | ICD-10-CM | POA: Insufficient documentation

## 2015-03-26 DIAGNOSIS — Z888 Allergy status to other drugs, medicaments and biological substances status: Secondary | ICD-10-CM | POA: Insufficient documentation

## 2015-03-26 DIAGNOSIS — I951 Orthostatic hypotension: Secondary | ICD-10-CM | POA: Insufficient documentation

## 2015-03-26 DIAGNOSIS — D259 Leiomyoma of uterus, unspecified: Secondary | ICD-10-CM | POA: Insufficient documentation

## 2015-03-26 LAB — COMPREHENSIVE METABOLIC PANEL
ALT: 8 U/L — AB (ref 14–54)
AST: 14 U/L — AB (ref 15–41)
Albumin: 3.1 g/dL — ABNORMAL LOW (ref 3.5–5.0)
Alkaline Phosphatase: 68 U/L (ref 38–126)
Anion gap: 10 (ref 5–15)
BILIRUBIN TOTAL: 0.6 mg/dL (ref 0.3–1.2)
BUN: 13 mg/dL (ref 6–20)
CALCIUM: 9.8 mg/dL (ref 8.9–10.3)
CO2: 25 mmol/L (ref 22–32)
CREATININE: 1.01 mg/dL — AB (ref 0.44–1.00)
Chloride: 99 mmol/L — ABNORMAL LOW (ref 101–111)
Glucose, Bld: 302 mg/dL — ABNORMAL HIGH (ref 65–99)
Potassium: 3.9 mmol/L (ref 3.5–5.1)
Sodium: 134 mmol/L — ABNORMAL LOW (ref 135–145)
TOTAL PROTEIN: 8.5 g/dL — AB (ref 6.5–8.1)

## 2015-03-26 LAB — CBC
HCT: 32.7 % — ABNORMAL LOW (ref 36.0–46.0)
Hemoglobin: 11 g/dL — ABNORMAL LOW (ref 12.0–15.0)
MCH: 23.4 pg — ABNORMAL LOW (ref 26.0–34.0)
MCHC: 33.6 g/dL (ref 30.0–36.0)
MCV: 69.6 fL — ABNORMAL LOW (ref 78.0–100.0)
PLATELETS: 573 10*3/uL — AB (ref 150–400)
RBC: 4.7 MIL/uL (ref 3.87–5.11)
RDW: 16.8 % — AB (ref 11.5–15.5)
WBC: 16.8 10*3/uL — AB (ref 4.0–10.5)

## 2015-03-26 LAB — I-STAT BETA HCG BLOOD, ED (MC, WL, AP ONLY)

## 2015-03-26 LAB — CBG MONITORING, ED: GLUCOSE-CAPILLARY: 196 mg/dL — AB (ref 65–99)

## 2015-03-26 LAB — LIPASE, BLOOD: LIPASE: 28 U/L (ref 11–51)

## 2015-03-26 MED ORDER — SODIUM CHLORIDE 0.9 % IV BOLUS (SEPSIS)
2000.0000 mL | Freq: Once | INTRAVENOUS | Status: AC
  Administered 2015-03-26: 2000 mL via INTRAVENOUS

## 2015-03-26 MED ORDER — METOCLOPRAMIDE HCL 5 MG/ML IJ SOLN
10.0000 mg | Freq: Once | INTRAMUSCULAR | Status: AC
  Administered 2015-03-26: 10 mg via INTRAVENOUS
  Filled 2015-03-26: qty 2

## 2015-03-26 MED ORDER — HYDROMORPHONE HCL 1 MG/ML IJ SOLN
2.0000 mg | Freq: Once | INTRAMUSCULAR | Status: AC
  Administered 2015-03-26: 2 mg via INTRAVENOUS
  Filled 2015-03-26: qty 2

## 2015-03-26 MED ORDER — HYDROMORPHONE HCL 1 MG/ML IJ SOLN
1.0000 mg | Freq: Once | INTRAMUSCULAR | Status: AC
  Administered 2015-03-26: 1 mg via INTRAVENOUS
  Filled 2015-03-26: qty 1

## 2015-03-26 NOTE — ED Notes (Signed)
Pt requesting her curtain to be closed so she could go to sleep. Pt resting comfortably.

## 2015-03-26 NOTE — ED Notes (Signed)
This RN had the pt stand up with Winfred Leeds, MD. Pt reported lightheadedness, and her heart rate increased to 125.

## 2015-03-26 NOTE — ED Provider Notes (Signed)
CSN: [id number]     Arrival date & time 03/26/15  1604 History   First MD Initiated Contact with Patient 03/26/15 1633     Chief Complaint  Patient presents with  . Abdominal Pain  . Emesis     (Consider location/radiation/quality/duration/timing/severity/associated sxs/prior Treatment) HPI Complains of left-sided abdominal pain onset 4 or 5 days ago, waxes and wanes, accompanied by multiple episodes of nonbloody emesis. Pain is typical gastroparesis that she experienced several times per month. She is treated herself with ibuprofen, without relief. She typically uses Phenergan but she has run out. She denies fever. Denies vaginal discharge. Last bowel movement yesterday, normal. No vaginal discharge, no urinary symptoms. Last normal menstrual period 02/27/2015. No other associated symptoms and nothing makes pain better or worse Past Medical History  Diagnosis Date  . Thrombocytosis ([place])     Hem/Onc suggested 2/2 chronic hepatits and/or iron deficiency anemia  . Iron deficiency anemia   . N&V (nausea and vomiting)     Chronic. Unclear etiology with multiple admission and ED visits. CT abdomen with and without contrast (02/2011)  showed no acute process. Gastic Emptying scan (01/2010) was normal. Ultrasound of the abdomen was within normal limits. Hepatitis B viral load was undectable. HIV NR. EGD - gastritis, Hpylori + s/p Rx  . Hypertension   . Hyperlipidemia   . Diabetes mellitus type 2, uncontrolled, with complications ([place])   . Recurrent boils   . 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 Cath 12/10- no restenosis or significant CAD progression  . Irregular menses     Small ovarian follicles seen on [id number])  . History of pyelonephritis     H/o GrpB Pyelonephritis (9/06) and UTI- 07/11- E.Coli, 12/10- GBS  . Abscess of tunica vaginalis     10/09- Abundant S. aureus-  sensitive to all abx  . Obesity   . Gastritis   . Depression   . Peripheral neuropathy ([place])   . Fibromyalgia   . GERD (gastroesophageal reflux disease)   . Anxiety   . Gastroparesis     secondary to poorly controlled DM, last emptying study performed 01/2010  was normal but may be falsely positive as pt was on reglan  . Blood dyscrasia   . Substance abuse   . MI (myocardial infarction) ([place]) 05/2006    PDA percutaneous transluminal coronary angioplasty  . OSA (obstructive sleep apnea)     "suppose to wear mask but I don't" (04/22/2014)  . Seasonal asthma   . Hepatitis B, chronic (HCC)     Hep BeAb+,Hep B cAb+ & Hep BsAg+ (9/06)  . Migraine     "weekly" (04/22/2014)  . CVA (cerebral vascular accident) ([place]) ~ 02/2014    denies residual on 04/22/2014  . CVA (cerebral vascular accident) [facility])     history of remote right cerebellar infarct noted on head CT at least since 10/2011  . Pneumonia     "this is probably the 2nd or 3rd time I've had pneumonia" (04/22/2014)   Past Surgical History  Procedure Laterality Date  . Cesarean section  1997  . Coronary angioplasty with stent placement  2008    "2 stents"  . Esophagogastroduodenoscopy N/A 04/23/2014    Procedure: ESOPHAGOGASTRODUODENOSCOPY (EGD);  Surgeon: Winfield Cunas., MD;  Location: [facility] ENDOSCOPY;  Service: Endoscopy;  Laterality: N/A;   Family History  Problem Relation Age of Onset  .  Diabetes Father    Social History  Substance Use Topics  . Smoking status: Former Smoker    Types: Cigarettes    Quit date: 04/24/1996  . Smokeless tobacco: Never Used     Comment: quit smoking cigarettes age 42  . Alcohol Use: 0.0 oz/week    0 Standard drinks or equivalent per week     Comment: 04/22/2014 "might have a few drinks a month"   OB History    No data available     Review of Systems  Constitutional: Negative.   HENT: Negative.   Respiratory: Negative.   Cardiovascular: Negative.   Gastrointestinal: Positive  for nausea, vomiting and abdominal pain.  Musculoskeletal: Negative.   Skin: Negative.   Allergic/Immunologic: Positive for immunocompromised state.       Diabetic  Neurological: Negative.   Psychiatric/Behavioral: Negative.   All other systems reviewed and are negative.     Allergies  Lisinopril and Morphine and related  Home Medications   Prior to Admission medications   Medication Sig Start Date End Date Taking? Authorizing Provider  ADVAIR DISKUS 100-50 MCG/DOSE AEPB Inhale 1 puff into the lungs 2 (two) times daily. Patient not taking: Reported on 12/22/2014 09/17/14   Luan Moore, MD  albuterol (PROVENTIL HFA;VENTOLIN HFA) 108 (90 BASE) MCG/ACT inhaler Inhale 2 puffs into the lungs every 4 (four) hours as needed for wheezing or shortness of breath. Patient not taking: Reported on 12/22/2014 04/26/14   Alexa Sherral Hammers, MD  atorvastatin (LIPITOR) 80 MG tablet Take 1 tablet (80 mg total) by mouth daily at 6 PM. Patient not taking: Reported on 12/22/2014 07/25/13   Dixon Boos, MD  BD PEN NEEDLE NANO U/F 32G X 4 MM MISC USE AS DIRECTED Patient not taking: Reported on 12/22/2014 07/31/14   Luan Moore, MD  ferrous sulfate 325 (65 FE) MG tablet Take 1 tablet (325 mg total) by mouth daily with breakfast. Patient not taking: Reported on 12/22/2014 10/15/14   Jones Bales, MD  gabapentin (NEURONTIN) 300 MG capsule Take 300 mg by mouth 3 (three) times daily.     Historical Provider, MD  insulin aspart (NOVOLOG FLEXPEN) 100 UNIT/ML FlexPen Inject 2-12 Units into the skin 3 (three) times daily with meals. Blood sugar- 150-199- take 2units, 200- 249- take 4units,  250- 299- take 7units, 300- 349- take 10units, >350- take 12units. Patient not taking: Reported on 12/22/2014 11/15/14   Ejiroghene Arlyce Dice, MD  Insulin Glargine (LANTUS SOLOSTAR) 100 UNIT/ML Solostar Pen Inject 50 Units into the skin daily at 10 pm. Patient not taking: Reported on 12/22/2014 11/15/14   Ejiroghene Arlyce Dice, MD   labetalol (NORMODYNE) 100 MG tablet Take 1 tablet (100 mg total) by mouth 2 (two) times daily. Patient not taking: Reported on 12/22/2014 11/15/14   Ejiroghene Arlyce Dice, MD  LORazepam (ATIVAN) 1 MG tablet Take 1 mg by mouth every 8 (eight) hours as needed for anxiety.    Historical Provider, MD  metoCLOPramide (REGLAN) 10 MG tablet Take 1 tablet (10 mg total) by mouth 3 (three) times daily before meals. Patient not taking: Reported on 12/22/2014 11/15/14   Ejiroghene Arlyce Dice, MD  omeprazole (PRILOSEC) 40 MG capsule Take 1 capsule (40 mg total) by mouth daily. Patient not taking: Reported on 12/22/2014 11/15/14   Ejiroghene E Emokpae, MD   BP 154/128 mmHg  Pulse 138  Temp(Src) 98.4 F (36.9 C) (Oral)  Resp 24  Ht 5\' 7"  (1.702 m)  Wt 213 lb (96.616 kg)  BMI  33.35 kg/m2  SpO2 100% Physical Exam  Constitutional: She appears well-developed and well-nourished. She appears distressed.  Appears is uncomfortable writhing on bed  HENT:  Head: Normocephalic and atraumatic.  Mucous membranes dry  Eyes: Conjunctivae are normal. Pupils are equal, round, and reactive to light.  Neck: Neck supple. No tracheal deviation present. No thyromegaly present.  Cardiovascular: Normal rate and regular rhythm.   No murmur heard. Tachycardic  Pulmonary/Chest: Effort normal and breath sounds normal.  Abdominal: Soft. Bowel sounds are normal. She exhibits no distension. There is no tenderness.  Obese  Musculoskeletal: Normal range of motion. She exhibits no edema or tenderness.  Neurological: She is alert. Coordination normal.  Skin: Skin is warm and dry. No rash noted.  Psychiatric: She has a normal mood and affect.  Nursing note and vitals reviewed.   ED Course  Procedures (including critical care time) Labs Review Labs Reviewed  LIPASE, BLOOD  COMPREHENSIVE METABOLIC PANEL  CBC  URINALYSIS, ROUTINE W REFLEX MICROSCOPIC (NOT AT [facility])  I-STAT BETA HCG BLOOD, ED ([facility], [facility], [facility] ONLY)    Imaging  Review No results found. I have personally reviewed and evaluated these images and lab results as part of my medical decision-making.   EKG Interpretation   Date/Time:  Friday March 26 2015 16:54:14 EST Ventricular Rate:  152 PR Interval:  150 QRS Duration: 100 QT Interval:  295 QTC Calculation: 469 R Axis:   64 Text Interpretation:  Sinus tachycardia Atrial premature complexes  Inferior infarct, age indeterminate Repol abnrm suggests ischemia, diffuse  leads Baseline wander in lead(s) I II aVR aVF SINCE LAST TRACING HEART  RATE HAS INCREASED Confirmed by Winfred Leeds  MD, Bronte Kropf [phone]) on 03/26/2015  5:19:09 PM     5:10 PM continues to complain of abdominal pain after treatment with 1 mg intravenous hydromorphone. Nausea is improved after treatment with intravenous Reglan and intravenous fluids. Patient continues to appear uncomfortable.  6:15 PM patient states nausea has resolved. Pain is mild at present. She appears in no distress. She declines further pain medicine  8 PM patient continues to feel lightheaded on standing. She does feel improved after intravenous hydration. 12:15 PM patient remains lightheaded on standing and tachycardic after treatment with 6 L of intravenous normal saline. Results for orders placed or performed during the hospital encounter of 03/26/15  Lipase, blood  Result Value Ref Range   Lipase 28 11 - 51 U/L  Comprehensive metabolic panel  Result Value Ref Range   Sodium 134 (L) 135 - 145 mmol/L   Potassium 3.9 3.5 - 5.1 mmol/L   Chloride 99 (L) 101 - 111 mmol/L   CO2 25 22 - 32 mmol/L   Glucose, Bld 302 (H) 65 - 99 mg/dL   BUN 13 6 - 20 mg/dL   Creatinine, Ser 1.01 (H) 0.44 - 1.00 mg/dL   Calcium 9.8 8.9 - 10.3 mg/dL   Total Protein 8.5 (H) 6.5 - 8.1 g/dL   Albumin 3.1 (L) 3.5 - 5.0 g/dL   AST 14 (L) 15 - 41 U/L   ALT 8 (L) 14 - 54 U/L   Alkaline Phosphatase 68 38 - 126 U/L   Total Bilirubin 0.6 0.3 - 1.2 mg/dL   GFR calc non Af Amer >60 >60  mL/min   GFR calc Af Amer >60 >60 mL/min   Anion gap 10 5 - 15  CBC  Result Value Ref Range   WBC 16.8 (H) 4.0 - 10.5 K/uL   RBC 4.70 3.87 - 5.11  MIL/uL   Hemoglobin 11.0 (L) 12.0 - 15.0 g/dL   HCT 32.7 (L) 36.0 - 46.0 %   MCV 69.6 (L) 78.0 - 100.0 fL   MCH 23.4 (L) 26.0 - 34.0 pg   MCHC 33.6 30.0 - 36.0 g/dL   RDW 16.8 (H) 11.5 - 15.5 %   Platelets 573 (H) 150 - 400 K/uL  I-Stat beta hCG blood, ED ([facility], [facility], [facility] only)  Result Value Ref Range   I-stat hCG, quantitative <5.0 <5 mIU/mL   Comment 3          POC CBG, ED  Result Value Ref Range   Glucose-Capillary 196 (H) 65 - 99 mg/dL   No results found.  MDM  Clinically patient is dehydrated from several days of vomiting, decreased oral intake Final diagnoses:  None   spoke with Dr.Truong plan: 23 hour observation medical surgical floor for continued intravenous hydration Dx #1 chronic abdominal pain #2 nausea and vomiting #3 hyperglycemia #3dehydration #5 hyperglycemia      Orlie Dakin, MD 03/27/15 0100

## 2015-03-26 NOTE — Telephone Encounter (Signed)
Able to reach pt via mothers phone number, pt reports she is leaving to come to ER now.

## 2015-03-26 NOTE — ED Notes (Signed)
Pt here for LLQ abd pain and N,V all week.

## 2015-03-26 NOTE — ED Notes (Signed)
Dr. Winfred Leeds and Chrislyn - RN aware of pt's pulse and HR.

## 2015-03-26 NOTE — ED Notes (Signed)
MD at bedside. 

## 2015-03-26 NOTE — Telephone Encounter (Signed)
Had to leave VM

## 2015-03-26 NOTE — Telephone Encounter (Signed)
Pt called in, crying inconsolably.  Pt reporting continued vomiting each time she eats, pain to left side of abdomen, reporting blood in her last vomit.  Last BM this morning.  Cannot take Reglan without throwing it up.  Is out of her Ativan 1mg .  Never followed up in the ED as advised on 11/17 with similar complaints.  I advised pt to be seen emergently by calling 911 or going to ER, inquired if she had someone to drive her, pt reports her cousin in on her way to get her and take her to the ED.  Will call back to check on pt in 10 minutes.

## 2015-03-27 DIAGNOSIS — R000 Tachycardia, unspecified: Secondary | ICD-10-CM

## 2015-03-27 DIAGNOSIS — R112 Nausea with vomiting, unspecified: Secondary | ICD-10-CM | POA: Diagnosis present

## 2015-03-27 DIAGNOSIS — D509 Iron deficiency anemia, unspecified: Secondary | ICD-10-CM

## 2015-03-27 DIAGNOSIS — I10 Essential (primary) hypertension: Secondary | ICD-10-CM

## 2015-03-27 DIAGNOSIS — Z8639 Personal history of other endocrine, nutritional and metabolic disease: Secondary | ICD-10-CM

## 2015-03-27 DIAGNOSIS — Z9114 Patient's other noncompliance with medication regimen: Secondary | ICD-10-CM

## 2015-03-27 DIAGNOSIS — R109 Unspecified abdominal pain: Secondary | ICD-10-CM

## 2015-03-27 DIAGNOSIS — E860 Dehydration: Secondary | ICD-10-CM | POA: Diagnosis present

## 2015-03-27 LAB — GLUCOSE, CAPILLARY
GLUCOSE-CAPILLARY: 170 mg/dL — AB (ref 65–99)
GLUCOSE-CAPILLARY: 119 mg/dL — AB (ref 65–99)
GLUCOSE-CAPILLARY: 306 mg/dL — AB (ref 65–99)
GLUCOSE-CAPILLARY: 325 mg/dL — AB (ref 65–99)
GLUCOSE-CAPILLARY: 165 mg/dL — AB (ref 65–99)

## 2015-03-27 LAB — CBC
HCT: 26 % — ABNORMAL LOW (ref 36.0–46.0)
Hemoglobin: 8.8 g/dL — ABNORMAL LOW (ref 12.0–15.0)
MCH: 23.7 pg — ABNORMAL LOW (ref 26.0–34.0)
MCHC: 33.8 g/dL (ref 30.0–36.0)
MCV: 69.9 fL — ABNORMAL LOW (ref 78.0–100.0)
PLATELETS: 513 10*3/uL — AB (ref 150–400)
RBC: 3.72 MIL/uL — ABNORMAL LOW (ref 3.87–5.11)
RDW: 17.1 % — AB (ref 11.5–15.5)
WBC: 13.9 10*3/uL — AB (ref 4.0–10.5)

## 2015-03-27 LAB — FERRITIN: FERRITIN: 15 ng/mL (ref 11–307)

## 2015-03-27 LAB — IRON AND TIBC
IRON: 12 ug/dL — AB (ref 28–170)
SATURATION RATIOS: 4 % — AB (ref 10.4–31.8)
TIBC: 330 ug/dL (ref 250–450)
UIBC: 318 ug/dL

## 2015-03-27 MED ORDER — LABETALOL HCL 100 MG PO TABS
100.0000 mg | ORAL_TABLET | Freq: Two times a day (BID) | ORAL | Status: DC
  Administered 2015-03-27 – 2015-03-28 (×4): 100 mg via ORAL
  Filled 2015-03-27 (×4): qty 1

## 2015-03-27 MED ORDER — ALBUTEROL SULFATE (2.5 MG/3ML) 0.083% IN NEBU
3.0000 mL | INHALATION_SOLUTION | RESPIRATORY_TRACT | Status: DC | PRN
  Administered 2015-03-28: 3 mL via RESPIRATORY_TRACT
  Filled 2015-03-27: qty 3

## 2015-03-27 MED ORDER — ACETAMINOPHEN 500 MG PO TABS
1000.0000 mg | ORAL_TABLET | Freq: Four times a day (QID) | ORAL | Status: DC | PRN
  Administered 2015-03-27: 1000 mg via ORAL
  Filled 2015-03-27: qty 2

## 2015-03-27 MED ORDER — FERROUS SULFATE 325 (65 FE) MG PO TABS
325.0000 mg | ORAL_TABLET | Freq: Every day | ORAL | Status: DC
  Administered 2015-03-27 – 2015-03-28 (×2): 325 mg via ORAL
  Filled 2015-03-27 (×2): qty 1

## 2015-03-27 MED ORDER — HEPARIN SODIUM (PORCINE) 5000 UNIT/ML IJ SOLN
5000.0000 [IU] | Freq: Three times a day (TID) | INTRAMUSCULAR | Status: DC
  Administered 2015-03-27 – 2015-03-28 (×4): 5000 [IU] via SUBCUTANEOUS
  Filled 2015-03-27 (×4): qty 1

## 2015-03-27 MED ORDER — PANTOPRAZOLE SODIUM 40 MG PO TBEC
40.0000 mg | DELAYED_RELEASE_TABLET | Freq: Every day | ORAL | Status: DC
  Administered 2015-03-27 – 2015-03-28 (×2): 40 mg via ORAL
  Filled 2015-03-27 (×2): qty 1

## 2015-03-27 MED ORDER — ATORVASTATIN CALCIUM 80 MG PO TABS
80.0000 mg | ORAL_TABLET | Freq: Every day | ORAL | Status: DC
  Administered 2015-03-27: 80 mg via ORAL
  Filled 2015-03-27: qty 1

## 2015-03-27 MED ORDER — METOCLOPRAMIDE HCL 10 MG PO TABS
10.0000 mg | ORAL_TABLET | Freq: Three times a day (TID) | ORAL | Status: DC
  Administered 2015-03-27 – 2015-03-28 (×5): 10 mg via ORAL
  Filled 2015-03-27 (×5): qty 1

## 2015-03-27 MED ORDER — SODIUM CHLORIDE 0.9 % IV SOLN
INTRAVENOUS | Status: AC
  Administered 2015-03-27: 03:00:00 via INTRAVENOUS

## 2015-03-27 MED ORDER — INSULIN ASPART 100 UNIT/ML ~~LOC~~ SOLN
0.0000 [IU] | SUBCUTANEOUS | Status: DC
  Administered 2015-03-27: 3 [IU] via SUBCUTANEOUS
  Administered 2015-03-27 (×3): 11 [IU] via SUBCUTANEOUS
  Administered 2015-03-27: 3 [IU] via SUBCUTANEOUS
  Administered 2015-03-28: 11 [IU] via SUBCUTANEOUS

## 2015-03-27 MED ORDER — SODIUM CHLORIDE 0.9 % IV SOLN
INTRAVENOUS | Status: DC
  Administered 2015-03-27 – 2015-03-28 (×2): via INTRAVENOUS

## 2015-03-27 MED ORDER — PROMETHAZINE HCL 25 MG/ML IJ SOLN: 12.5000 mg | Freq: Four times a day (QID) | INTRAMUSCULAR | Status: DC | PRN

## 2015-03-27 MED ORDER — GABAPENTIN 300 MG PO CAPS
300.0000 mg | ORAL_CAPSULE | Freq: Three times a day (TID) | ORAL | Status: DC
  Administered 2015-03-27 – 2015-03-28 (×4): 300 mg via ORAL
  Filled 2015-03-27 (×4): qty 1

## 2015-03-27 MED ORDER — MOMETASONE FURO-FORMOTEROL FUM 100-5 MCG/ACT IN AERO
2.0000 | INHALATION_SPRAY | Freq: Two times a day (BID) | RESPIRATORY_TRACT | Status: DC
  Administered 2015-03-28: 2 via RESPIRATORY_TRACT
  Filled 2015-03-27 (×2): qty 8.8

## 2015-03-27 NOTE — Progress Notes (Signed)
NURSING PROGRESS NOTE  Shirley [name] [id number] Admission Data: 03/27/2015 2:57 AM Attending Provider: Sid Falcon, MD [id number] Cassie Freer, MD Code Status: Full   Shirley [name] is a 39 y.o. female patient admitted from ED:  -No acute distress noted.  -No complaints of shortness of breath.  -No complaints of chest pain.   Cardiac Monitoring: N/A  Blood pressure 147/96, pulse 101, temperature 98.4 F (36.9 C), temperature source Oral, resp. rate 18, height 5\' 7"  (1.702 m), weight 96.616 kg (213 lb), SpO2 100 %.   IV Fluids:  IV in place, occlusive dsg intact without redness, IV cath forearm right, condition patent and no redness none.   Allergies:  Lisinopril and Morphine and related  Past Medical History:   has a past medical history of Thrombocytosis ([place]); Iron deficiency anemia; N&V (nausea and vomiting); Hypertension; Hyperlipidemia; Diabetes mellitus type 2, uncontrolled, with complications ([place]); Recurrent boils; CAD (coronary artery disease) (06/15/2006); Irregular menses; History of pyelonephritis; Abscess of tunica vaginalis; Obesity; Gastritis; Depression; Peripheral neuropathy ([place]); Fibromyalgia; GERD (gastroesophageal reflux disease); Anxiety; Gastroparesis; Blood dyscrasia; Substance abuse; MI (myocardial infarction) ([place]) (05/2006); OSA (obstructive sleep apnea); Seasonal asthma; Hepatitis B, chronic ([place]); Migraine; CVA (cerebral vascular accident) ([place]) (~ 02/2014); CVA (cerebral vascular accident) ([place]); and Pneumonia.  Past Surgical History:   has past surgical history that includes Cesarean section (1997); Coronary angioplasty with stent (2008); and Esophagogastroduodenoscopy (N/A, 04/23/2014).  Social History:   reports that she quit smoking about 18 years ago. Her smoking use included Cigarettes. She has never used smokeless tobacco. She reports that she drinks alcohol. She reports that she does not use illicit drugs.  Skin: intact   Patient/Family orientated  to room. Information packet given to patient/family. Admission inpatient armband information verified with patient/family to include name and date of birth and placed on patient arm. Side rails up x 2, fall assessment and education completed with patient/family. Patient/family able to verbalize understanding of risk associated with falls and verbalized understanding to call for assistance before getting out of bed. Call light within reach. Patient/family able to voice and demonstrate understanding of unit orientation instructions.

## 2015-03-27 NOTE — Progress Notes (Signed)
Patient orthostatic vitals: laying-118/64 HR-118, sitting-127/85 HR-109, standing-174/112 HR-129. After standing for 3 minutes, BP-162/135. Patient was in no distress. MD notified. Will continue to monitor.

## 2015-03-27 NOTE — Progress Notes (Signed)
Nutrition Brief Note  Patient identified on the Malnutrition Screening Tool (MST) Report  Wt Readings from Last 15 Encounters:  03/26/15 213 lb (96.616 kg)  12/22/14 223 lb 14.4 oz (101.56 kg)  11/12/14 210 lb (95.255 kg)  10/14/14 221 lb 9.6 oz (100.517 kg)  07/22/14 204 lb (92.534 kg)  07/15/14 200 lb (90.719 kg)  04/25/14 215 lb 11.2 oz (97.841 kg)  04/04/14 206 lb 5.6 oz (93.6 kg)  02/18/14 210 lb 14.4 oz (95.664 kg)  11/24/13 196 lb (88.905 kg)  11/09/13 190 lb (86.183 kg)  08/27/13 205 lb (92.987 kg)  08/09/13 196 lb (88.905 kg)  07/29/13 206 lb 9 oz (93.696 kg)  07/24/13 215 lb 13.3 oz (97.9 kg)   This is a 39 yo lady with HTN, chronic ischemic heart disease, IDDM with gastroparesis, depression who is here for left sided abdominal pain that started about 5 days back And which she describes as waxing and waning in nature. She says she also intermittently had bloody emesis. She usually gets gastroparesis type pain once a week or so and it hats 1-2 days, but this time it was worse. Her abdominal pain is 10/10, and it is left sided, and it does not radiate anywhere.   Hx obtained from pt at bedside. She reports appetite has "been on and off" for the past 3-4 days PTA. Prior to this, she reports good appetite. She denies any weight loss. She reveals her UBW is around 195#. Reviewed wt hx, which reveals wt fluctuation between 196-223#. Noted that pt with hx of uncontrolled diabetes, due to inability to afford medications; noted Care Management consult has been ordered to help address this.   Breakfast tray was brought in and pt reports she feels up to eating.   Nutrition-Focused physical exam completed. Findings are no fat depletion, no muscle depletion, and no edema.   Body mass index is 33.35 kg/(m^2). Patient meets criteria for obesity, class I based on current BMI.   Current diet order is Carb Modified, patient is consuming approximately n/a% of meals at this time. Labs and  medications reviewed.   No nutrition interventions warranted at this time. If nutrition issues arise, please consult RD.   Jhoel Stieg A. Jimmye Norman, RD, LDN, CDE Pager: [phone] After hours Pager: [phone]

## 2015-03-27 NOTE — Progress Notes (Signed)
Attempted report x1, nurse to call back.

## 2015-03-27 NOTE — Progress Notes (Signed)
Subjective: Reports she is feeling much better today. She is hungry and wants to try to eat. Abdominal pain has improved. Denies N/V.  Objective: Vital signs in last 24 hours: Filed Vitals:   03/27/15 0333 03/27/15 0336 03/27/15 0456 03/27/15 1008  BP: 139/88 139/88 114/71 122/82  Pulse: 100 100 110 130  Temp:   98.9 F (37.2 C)   TempSrc:   Oral   Resp:  20 17   Height:      Weight:      SpO2:  98% 98%    Weight change:   Intake/Output Summary (Last 24 hours) at 03/27/15 1332 Last data filed at 03/27/15 0058  Gross per 24 hour  Intake   6000 ml  Output      0 ml  Net   6000 ml   General Apperance: NAD HEENT: Normocephalic, atraumatic, PERRL, EOMI, anicteric sclera Neck: Supple, trachea midline Lungs: Clear to auscultation bilaterally. No wheezes, rhonchi or rales. Breathing comfortably Heart: Regular rate and rhythm, no murmur/rub/gallop Abdomen: Soft, nontender, nondistended, no rebound/guarding Extremities: Normal, atraumatic, warm and well perfused, no edema Skin: No rashes or lesions Neurologic: Alert and oriented x 3. No focal deficits.  Lab Results: Basic Metabolic Panel:  Recent Labs Lab 03/26/15 1637  NA 134*  K 3.9  CL 99*  CO2 25  GLUCOSE 302*  BUN 13  CREATININE 1.01*  CALCIUM 9.8   Liver Function Tests:  Recent Labs Lab 03/26/15 1637  AST 14*  ALT 8*  ALKPHOS 68  BILITOT 0.6  PROT 8.5*  ALBUMIN 3.1*    Recent Labs Lab 03/26/15 1637  LIPASE 28   CBC:  Recent Labs Lab 03/26/15 1637 03/27/15 0828  WBC 16.8* 13.9*  HGB 11.0* 8.8*  HCT 32.7* 26.0*  MCV 69.6* 69.9*  PLT 573* 513*   CBG:  Recent Labs Lab 03/26/15 2057 03/27/15 0454 03/27/15 0738 03/27/15 1152  GLUCAP 196* 170* 119* 306*   Urine Drug Screen: Drugs of Abuse     Component Value Date/Time   LABOPIA POSITIVE* 11/11/2014 1658   LABOPIA NEGATIVE 02/21/2010 2201   COCAINSCRNUR POSITIVE* 11/11/2014 1658   COCAINSCRNUR NEGATIVE 02/21/2010 2201   LABBENZ NONE DETECTED 11/11/2014 1658   LABBENZ NEGATIVE 02/21/2010 2201   AMPHETMU NONE DETECTED 11/11/2014 1658   AMPHETMU NEGATIVE 02/21/2010 2201   THCU NONE DETECTED 11/11/2014 1658   LABBARB NONE DETECTED 11/11/2014 1658     Micro Results: No results found for this or any previous visit (from the past 240 hour(s)).   Studies/Results: No results found.   Medications: I have reviewed the patient's current medications. Scheduled Meds: . sodium chloride   Intravenous STAT  . atorvastatin  80 mg Oral q1800  . gabapentin  300 mg Oral TID  . heparin  5,000 Units Subcutaneous 3 times per day  . insulin aspart  0-15 Units Subcutaneous 6 times per day  . labetalol  100 mg Oral BID  . metoCLOPramide  10 mg Oral TID AC  . mometasone-formoterol  2 puff Inhalation BID  . pantoprazole  40 mg Oral Daily   Continuous Infusions: . sodium chloride     PRN Meds:.albuterol, promethazine Assessment/Plan: 45F with uncontrolled DM2 who presents with nausea and vomiting x 3-4 x days.   Gastroparesis: Likely cause of abdominal pain. +Orthostatics with BP standing 112/82 and lying 139/88. Symptoms improved now that she has been restarted on Reglan.  -Advance diet as tolerated -NS@150ml /hr -Recheck orthostatics. She likely has autonomic dysfunction though from her  uncontrolled diabetes. -Scheduled Reglan -Phenergan prn -Protonix 40mg  daily -Avoid narcotics  DM2: A1c 12.9% on 10/13/2014 -Hemoglobin A1c pending -SSI -Continue home gabapentin 300mg  TID -Care management for medication assistance  Microcytic anemia: Likely related to fibroids and menorrhagia. Hemoglobin 13.5 on 7/22. 11 on admission. Down to 8.8 this morning - likely dilutional as she is receiving IV fluids. Previous iron <10 and ferritin 15 on 04/22/2014. -Obtain iron, TIBC, and ferritin -Ferrous sulfate 325mg  daily  Leukocytosis: Chronic leukocytosis with WBC baseline around 15. 16.8 on admission now down to  13.9.  Hypertension: Continue labetalol 100mg  BID  VTE ppx: subq hep  Dispo: Disposition is deferred at this time, awaiting improvement of current medical problems.  Anticipated discharge in approximately 0-1 day(s).   The patient does have a current PCP Collier Salina, MD) and does need an [facility] hospital follow-up appointment after discharge.  The patient does not have transportation limitations that hinder transportation to clinic appointments.  .Services Needed at time of discharge: Y = Yes, Blank = No PT:   OT:   RN:   Equipment:   Other:       Milagros Loll, MD 03/27/2015, 1:32 PM

## 2015-03-27 NOTE — Progress Notes (Signed)
Report received from [place], [place] form ED. Awaiting pt's arrival.

## 2015-03-27 NOTE — H&P (Signed)
Date: 03/27/2015               Patient Name:  Shirley [name] MRN: [id number]  DOB: 05-03-1975 Age / Sex: 39 y.o., female   PCP: Collier Salina, MD         Medical Service: Internal Medicine Teaching Service         Attending Physician: Dr. Sid Falcon, MD    First Contact: Dr. Blane Ohara Pager: [phone]  Second Contact: Dr. Jacques Earthly Pager: [phone]       After Hours (After 5p/  First Contact Pager: [phone]  weekends / holidays): Second Contact Pager: [phone]   Chief Complaint:abdominal pain and nausea  History of Present Illness:   This is a 39 yo lady with HTN, chronic ischemic heart disease, IDDM with gastroparesis, depression who is here for left sided abdominal pain that started about 5 days back  And which she describes as waxing and waning in nature. She says she also intermittently had bloody emesis. She usually gets gastroparesis type pain once a week or so and it hats 1-2 days, but this time it was worse. Her abdominal pain is 10/10, and it is left sided, and it does not radiate anywhere.  She states that she has not eaten in the last 4 days and her last full meal was on thanksgiving day.   She has a difficult social situation in that she currently does not have insurance and lost medicaid, and so she is unable to buy any of her meds, and has not been taking her insulin for the past 3-4 months, and also phenergan which usually helps with the gastroparesis.  Patient denies fevers, chills, back pain, weight changes, headaches, vaginal discharge, hematuria, hematochezia, melena. Her LMP was on Nov 5th. She describes her periods as heavy and lasting 7 days and she was diagnosed with fibroids in [place].    She had recent admissions for gastroparesis in August at [facility], and also in July for n/v here. She did not undergo gastric emptying study as she was allergic to eggs. Most recent ER visit was last month [place].  In the ER, she was found to be  tachycardic and in considerable pain so IMTS was called.   Meds: No current facility-administered medications for this encounter.   Current Outpatient Prescriptions  Medication Sig Dispense Refill  . ADVAIR DISKUS 100-50 MCG/DOSE AEPB Inhale 1 puff into the lungs 2 (two) times daily. (Patient not taking: Reported on 12/22/2014) 60 each 2  . albuterol (PROVENTIL HFA;VENTOLIN HFA) 108 (90 BASE) MCG/ACT inhaler Inhale 2 puffs into the lungs every 4 (four) hours as needed for wheezing or shortness of breath. (Patient not taking: Reported on 12/22/2014) 1 Inhaler 4  . atorvastatin (LIPITOR) 80 MG tablet Take 1 tablet (80 mg total) by mouth daily at 6 PM. (Patient not taking: Reported on 12/22/2014) 30 tablet 2  . BD PEN NEEDLE NANO U/F 32G X 4 MM MISC USE AS DIRECTED (Patient not taking: Reported on 12/22/2014) 100 each 6  . ferrous sulfate 325 (65 FE) MG tablet Take 1 tablet (325 mg total) by mouth daily with breakfast. (Patient not taking: Reported on 12/22/2014) 90 tablet 2  . gabapentin (NEURONTIN) 300 MG capsule Take 300 mg by mouth 3 (three) times daily.     . insulin aspart (NOVOLOG FLEXPEN) 100 UNIT/ML FlexPen Inject 2-12 Units into the skin 3 (three) times daily with meals. Blood sugar- 150-199- take 2units, 200- 249- take  4units,  250- 299- take 7units, 300- 349- take 10units, >350- take 12units. (Patient not taking: Reported on 12/22/2014) 15 mL 1  . Insulin Glargine (LANTUS SOLOSTAR) 100 UNIT/ML Solostar Pen Inject 50 Units into the skin daily at 10 pm. (Patient not taking: Reported on 12/22/2014) 15 mL 1  . labetalol (NORMODYNE) 100 MG tablet Take 1 tablet (100 mg total) by mouth 2 (two) times daily. (Patient not taking: Reported on 12/22/2014) 60 tablet 0  . LORazepam (ATIVAN) 1 MG tablet Take 1 mg by mouth every 8 (eight) hours as needed for anxiety.    . metoCLOPramide (REGLAN) 10 MG tablet Take 1 tablet (10 mg total) by mouth 3 (three) times daily before meals. (Patient not taking: Reported on  12/22/2014) 90 tablet 1  . omeprazole (PRILOSEC) 40 MG capsule Take 1 capsule (40 mg total) by mouth daily. (Patient not taking: Reported on 12/22/2014) 30 capsule 1    Allergies: Allergies as of 03/26/2015 - Review Complete 03/26/2015  Allergen Reaction Noted  . Lisinopril Nausea Only 03/17/2008  . Morphine and related Itching 04/15/2013   Past Medical History  Diagnosis Date  . Thrombocytosis ([place])     Hem/Onc suggested 2/2 chronic hepatits and/or iron deficiency anemia  . Iron deficiency anemia   . N&V (nausea and vomiting)     Chronic. Unclear etiology with multiple admission and ED visits. CT abdomen with and without contrast (02/2011)  showed no acute process. Gastic Emptying scan (01/2010) was normal. Ultrasound of the abdomen was within normal limits. Hepatitis B viral load was undectable. HIV NR. EGD - gastritis, Hpylori + s/p Rx  . Hypertension   . Hyperlipidemia   . Diabetes mellitus type 2, uncontrolled, with complications ([place])   . Recurrent boils   . 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 Cath 12/10- no restenosis or significant CAD progression  . Irregular menses     Small ovarian follicles seen on [id number]  . History of pyelonephritis     H/o GrpB Pyelonephritis (9/06) and UTI- 07/11- E.Coli, 12/10- GBS  . Abscess of tunica vaginalis     10/09- Abundant S. aureus- sensitive to all abx  . Obesity   . Gastritis   . Depression   . Peripheral neuropathy ([place])   . Fibromyalgia   . GERD (gastroesophageal reflux disease)   . Anxiety   . Gastroparesis     secondary to poorly controlled DM, last emptying study performed 01/2010  was normal but may be falsely positive as pt was on reglan  . Blood dyscrasia   . Substance abuse   . MI (myocardial infarction) ([place]) 05/2006    PDA percutaneous transluminal coronary angioplasty  . OSA (obstructive sleep apnea)      "suppose to wear mask but I don't" (04/22/2014)  . Seasonal asthma   . Hepatitis B, chronic (HCC)     Hep BeAb+,Hep B cAb+ & Hep BsAg+ (9/06)  . Migraine     "weekly" (04/22/2014)  . CVA (cerebral vascular accident) ([place]) ~ 02/2014    denies residual on 04/22/2014  . CVA (cerebral vascular accident) [facility])     history of remote right cerebellar infarct noted on head CT at least since 10/2011  . Pneumonia     "this is probably the 2nd or 3rd time I've had pneumonia" (04/22/2014)   Past Surgical History  Procedure Laterality Date  . Cesarean section  1997  . Coronary angioplasty with stent placement  2008    "2 stents"  . Esophagogastroduodenoscopy N/A 04/23/2014    Procedure: ESOPHAGOGASTRODUODENOSCOPY (EGD);  Surgeon: Winfield Cunas., MD;  Location: [facility] ENDOSCOPY;  Service: Endoscopy;  Laterality: N/A;   Family History  Problem Relation Age of Onset  . Diabetes Father    Social History   Social History  . Marital Status: Single    Spouse Name: N/A  . Number of Children: 2  . Years of Education: 11   Occupational History  . other     unemployed   Social History Main Topics  . Smoking status: Former Smoker    Types: Cigarettes    Quit date: 04/24/1996  . Smokeless tobacco: Never Used     Comment: quit smoking cigarettes age 73  . Alcohol Use: 0.0 oz/week    0 Standard drinks or equivalent per week     Comment: 04/22/2014 "might have a few drinks a month"  . Drug Use: No     Comment: 04/22/2104 "quit drugs ~ 1-2 yr ago"  . Sexual Activity: Yes   Other Topics Concern  . 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   Also works at [employer] having to lift elderly individuals.          Review of Systems: Complete ROS obtained and pertinents noted in HPI  Physical Exam: Blood pressure 148/93, pulse 107, temperature 98.7 F (37.1 C), temperature source Oral, resp. rate  18, height 5\' 7"  (1.702 m), weight 213 lb (96.616 kg), SpO2 100 %.  General: A&O, obese,  in visible distress and lying in bed HEENT: EOMI, PERRLA, sclera white, conjunctiva pink, no ear/nose/throat erythema Neck: supple, midline trachea,  CV: tachycardic, normal rhythm , normal s1, s2, no m/r/g, no carotid bruits appreciated, no JVD appreciated Resp: equal and symmetric breath sounds, no wheezing heard Abdomen: tended to palpation in mid and left upper quadrant. No epigastric tenderness. No rebound, no guarding, positive bowel sounds in all 4 quadrants GU: no CVA tenderness Skin: warm, dry, intact, no open lesions or rashes noted Extremities: pulses intact b/l, no edema, clubbing or cyanosis, no calf tenderness  Neurologic: no focal neuro deficits  Lab results: Results for orders placed or performed during the hospital encounter of 03/26/15 (from the past 24 hour(s))  Lipase, blood     Status: None   Collection Time: 03/26/15  4:37 PM  Result Value Ref Range   Lipase 28 11 - 51 U/L  Comprehensive metabolic panel     Status: Abnormal   Collection Time: 03/26/15  4:37 PM  Result Value Ref Range   Sodium 134 (L) 135 - 145 mmol/L   Potassium 3.9 3.5 - 5.1 mmol/L   Chloride 99 (L) 101 - 111 mmol/L   CO2 25 22 - 32 mmol/L   Glucose, Bld 302 (H) 65 - 99 mg/dL   BUN 13 6 - 20 mg/dL   Creatinine, Ser 1.01 (H) 0.44 - 1.00 mg/dL   Calcium 9.8 8.9 - 10.3 mg/dL   Total Protein 8.5 (H) 6.5 - 8.1 g/dL   Albumin 3.1 (L) 3.5 - 5.0 g/dL   AST 14 (L) 15 - 41 U/L   ALT 8 (L) 14 - 54 U/L   Alkaline Phosphatase 68 38 - 126 U/L   Total Bilirubin 0.6 0.3 - 1.2 mg/dL   GFR calc  non Af Amer >60 >60 mL/min   GFR calc Af Amer >60 >60 mL/min   Anion gap 10 5 - 15  CBC     Status: Abnormal   Collection Time: 03/26/15  4:37 PM  Result Value Ref Range   WBC 16.8 (H) 4.0 - 10.5 K/uL   RBC 4.70 3.87 - 5.11 MIL/uL   Hemoglobin 11.0 (L) 12.0 - 15.0 g/dL   HCT 32.7 (L) 36.0 - 46.0 %   MCV 69.6 (L) 78.0 -  100.0 fL   MCH 23.4 (L) 26.0 - 34.0 pg   MCHC 33.6 30.0 - 36.0 g/dL   RDW 16.8 (H) 11.5 - 15.5 %   Platelets 573 (H) 150 - 400 K/uL  I-Stat beta hCG blood, ED ([facility], [facility], [facility] only)     Status: None   Collection Time: 03/26/15  4:42 PM  Result Value Ref Range   I-stat hCG, quantitative <5.0 <5 mIU/mL   Comment 3          POC CBG, ED     Status: Abnormal   Collection Time: 03/26/15  8:57 PM  Result Value Ref Range   Glucose-Capillary 196 (H) 65 - 99 mg/dL  Glucose, capillary     Status: Abnormal   Collection Time: 03/27/15  4:54 AM  Result Value Ref Range   Glucose-Capillary 170 (H) 65 - 99 mg/dL     Imaging results:  No results found.  Other results: EKG: premature atrial complexes and sinus tachycardia   Assessment & Plan by Problem:  Abdominal pain most likely due to diabetic gastroparesis:  Other differentials include pancreatitis, but lipase was negative, appendicitis as she had an elevated white count but she has had chronically elevated white count. Her bhcg pregnancy test was negative, and unlikely to be of ob/gyn etiology as pt does not have pelvic pain or discharge. She has had previous admissions for gastroparesis, and pt says her pain is similar to the gastroparesis pain she usually has.  t's last ER visit was last month at [place]. She recently had CT abd/pelvis done iN August which showed gastritis and colon inflammation. Normal appendix.  She had EGD done in Dec 2015 which suggested gastroperesis as there was residual food in esophagus.   Do not think patient is septic as patient says her tachycardia is not new, and was likely from dehydration and poor po intake, and her tachycardia is likely also from autonomic dysfunction. Her white count is chronically elevated and she is afebrile   -receiving IV fluids NS 150 ml/hr, and received already 6L of boluses in the ER -orthostatics vitals -phenergan PRN -Reglan 10 mg tid -protonix 40 mg daily -neurontin 300 mg tid    -reconsider gastric emptying study   HTN: Patient who has not taken her medicines recently with BP in the ER being 150/90  -on labetalol 100 mg bid  T2DM: Pt with poorly controlled diabetes with last a1c being 12.9 in June who has not taken her novolog 7030 in few months now due to being unable to afford her insulin -repeat a1c -monitor cbg q4 hours  -cbgs have been stable during this admission  Microcytic anemia: patient with hemoglobin of 11 with MCV of 70. Likely iron deficient. Patient denies hematochezia or melena, but does endorse bloody emesis. Most likely her microcytic anemia is from the uterine fibroids causing her heavy menstrual periods.  -repeat cbc in [place] -obtain ferritin and reticulocytes -transfuse if hgb < 7 -needs iron supplementation  -needs obgyn  referral   Nutritional consult- Needs nutritional supplementation as pt has not eaten in a while, for something she can easily tolerate like glucerna shake   Social situation- need to consult social work for pt to obtain her insulin and her other meds       Dispo: Disposition is deferred at this time, awaiting improvement of current medical problems. Anticipated discharge in approximately 2 day(s).   The patient does have a current PCP Collier Salina, MD) and does not need an [facility] hospital follow-up appointment after discharge.  The patient does not have transportation limitations that hinder transportation to clinic appointments.  Signed: Burgess Estelle, MD 03/27/2015, 1:38 AM

## 2015-03-27 NOTE — Progress Notes (Signed)
Patient hgb 11.0 on 03/26/15 and hgb is 8.8 on 03/27/15. MD notified with no new orders. Will continue to monitor.

## 2015-03-27 NOTE — Progress Notes (Signed)
CM consult:Per pt, Pt has no medicare, has trouble with getting medicine. CM spoke with pt and confirmed  pt is without medical insurance /no prescription coverage. Pt states can't afford medication. CM explained and provided pt with a MATCH letter to cover prescriptions @ d/c and a listing of participating match pharmacies to get prescriptions filled. Pt was appreciative.  No other needs identified @ present time. Whitman Hero RN,BSN,CM [phone]

## 2015-03-28 DIAGNOSIS — Z794 Long term (current) use of insulin: Secondary | ICD-10-CM

## 2015-03-28 DIAGNOSIS — E1143 Type 2 diabetes mellitus with diabetic autonomic (poly)neuropathy: Secondary | ICD-10-CM

## 2015-03-28 DIAGNOSIS — E1165 Type 2 diabetes mellitus with hyperglycemia: Secondary | ICD-10-CM

## 2015-03-28 DIAGNOSIS — R112 Nausea with vomiting, unspecified: Secondary | ICD-10-CM

## 2015-03-28 DIAGNOSIS — E860 Dehydration: Secondary | ICD-10-CM

## 2015-03-28 DIAGNOSIS — K3184 Gastroparesis: Secondary | ICD-10-CM

## 2015-03-28 LAB — CBC
HEMATOCRIT: 29.1 % — AB (ref 36.0–46.0)
HEMOGLOBIN: 9.5 g/dL — AB (ref 12.0–15.0)
MCH: 23.1 pg — ABNORMAL LOW (ref 26.0–34.0)
MCHC: 32.6 g/dL (ref 30.0–36.0)
MCV: 70.6 fL — ABNORMAL LOW (ref 78.0–100.0)
Platelets: 550 10*3/uL — ABNORMAL HIGH (ref 150–400)
RBC: 4.12 MIL/uL (ref 3.87–5.11)
RDW: 17.1 % — ABNORMAL HIGH (ref 11.5–15.5)
WBC: 10.3 10*3/uL (ref 4.0–10.5)

## 2015-03-28 LAB — GLUCOSE, CAPILLARY
GLUCOSE-CAPILLARY: 298 mg/dL — AB (ref 65–99)
GLUCOSE-CAPILLARY: 302 mg/dL — AB (ref 65–99)
GLUCOSE-CAPILLARY: 346 mg/dL — AB (ref 65–99)
Glucose-Capillary: 216 mg/dL — ABNORMAL HIGH (ref 65–99)
Glucose-Capillary: 198 mg/dL — ABNORMAL HIGH (ref 65–99)

## 2015-03-28 LAB — BASIC METABOLIC PANEL
ANION GAP: 6 (ref 5–15)
BUN: 6 mg/dL (ref 6–20)
CO2: 24 mmol/L (ref 22–32)
Calcium: 8.4 mg/dL — ABNORMAL LOW (ref 8.9–10.3)
Chloride: 106 mmol/L (ref 101–111)
Creatinine, Ser: 0.99 mg/dL (ref 0.44–1.00)
GLUCOSE: 262 mg/dL — AB (ref 65–99)
POTASSIUM: 3.6 mmol/L (ref 3.5–5.1)
SODIUM: 136 mmol/L (ref 135–145)

## 2015-03-28 LAB — MAGNESIUM: MAGNESIUM: 1.7 mg/dL (ref 1.7–2.4)

## 2015-03-28 MED ORDER — GABAPENTIN 300 MG PO CAPS: 300.0000 mg | ORAL_CAPSULE | Freq: Three times a day (TID) | ORAL | Status: DC

## 2015-03-28 MED ORDER — INSULIN ASPART 100 UNIT/ML ~~LOC~~ SOLN
0.0000 [IU] | SUBCUTANEOUS | Status: DC
  Administered 2015-03-28: 8 [IU] via SUBCUTANEOUS
  Administered 2015-03-28: 3 [IU] via SUBCUTANEOUS
  Administered 2015-03-28: 11 [IU] via SUBCUTANEOUS

## 2015-03-28 MED ORDER — ATORVASTATIN CALCIUM 80 MG PO TABS
80.0000 mg | ORAL_TABLET | Freq: Every day | ORAL | Status: DC
Start: 2015-03-28 — End: 2015-04-02

## 2015-03-28 MED ORDER — LABETALOL HCL 100 MG PO TABS: 100.0000 mg | ORAL_TABLET | Freq: Two times a day (BID) | ORAL | Status: DC

## 2015-03-28 MED ORDER — METOCLOPRAMIDE HCL 10 MG PO TABS: 10.0000 mg | ORAL_TABLET | Freq: Three times a day (TID) | ORAL | Status: DC

## 2015-03-28 MED ORDER — FERROUS SULFATE 325 (65 FE) MG PO TABS: 325.0000 mg | ORAL_TABLET | Freq: Every day | ORAL | Status: DC

## 2015-03-28 MED ORDER — OMEPRAZOLE 40 MG PO CPDR: 40.0000 mg | DELAYED_RELEASE_CAPSULE | Freq: Every day | ORAL | Status: DC

## 2015-03-28 MED ORDER — INSULIN PEN NEEDLE 32G X 4 MM MISC: Status: DC

## 2015-03-28 MED ORDER — INSULIN GLARGINE 100 UNIT/ML SOLOSTAR PEN: 20.0000 [IU] | PEN_INJECTOR | Freq: Every day | SUBCUTANEOUS | Status: DC

## 2015-03-28 MED ORDER — PROMETHAZINE HCL 12.5 MG PO TABS: 12.5000 mg | ORAL_TABLET | Freq: Four times a day (QID) | ORAL | Status: DC | PRN

## 2015-03-28 NOTE — Discharge Instructions (Signed)
Shirley [name], the clinic will call you schedule a follow up appointment. If you do not hear from them tomorrow please call the clinic. If you are unable to get your medications please contact the clinic.   Gastroparesis Gastroparesis, also called delayed gastric emptying, is a condition in which food takes longer than normal to empty from the stomach. The condition is usually long-lasting (chronic). CAUSES This condition may be caused by:  An endocrine disorder, such as hypothyroidism or diabetes. Diabetes is the most common cause of this condition.  A nervous system disease, such as Parkinson disease or multiple sclerosis.  Cancer, infection, or surgery of the stomach or vagus nerve.  A connective tissue disorder, such as scleroderma.  Certain medicines. In most cases, the cause is not known. RISK FACTORS This condition is more likely to develop in:  People with certain disorders, including endocrine disorders, eating disorders, amyloidosis, and scleroderma.  People with certain diseases, including Parkinson disease or multiple sclerosis.  People with cancer or infection of the stomach or vagus nerve.  People who have had surgery on the stomach or vagus nerve.  People who take certain medicines.  Women. SYMPTOMS Symptoms of this condition include:  An early feeling of fullness when eating.  Nausea.  Weight loss.  Vomiting.  Heartburn.  Abdominal bloating.  Inconsistent blood glucose levels.  Lack of appetite.  Acid from the stomach coming up into the esophagus (gastroesophageal reflux).  Spasms of the stomach. Symptoms may come and go. DIAGNOSIS This condition is diagnosed with tests, such as:  Tests that check how long it takes food to move through the stomach and intestines. These tests include:  Upper gastrointestinal (GI) series. In this test, X-rays of the intestines are taken after you drink a liquid. The liquid makes the intestines show up better on  the X-rays.  Gastric emptying scintigraphy. In this test, scans are taken after you eat food that contains a small amount of radioactive material.  Wireless capsule GI monitoring system. This test involves swallowing a capsule that records information about movement through the stomach.  Gastric manometry. This test measures electrical and muscular activity in the stomach. It is done with a thin tube that is passed down the throat and into the stomach.  Endoscopy. This test checks for abnormalities in the lining of the stomach. It is done with a long, thin tube that is passed down the throat and into the stomach.  An ultrasound. This test can help rule out gallbladder disease or pancreatitis as a cause of your symptoms. It uses sound waves to take pictures of the inside of your body. TREATMENT There is no cure for gastroparesis. This condition may be managed with:  Treatment of the underlying condition causing the gastroparesis.  Lifestyle changes, including exercise and dietary changes. Dietary changes can include:  Changes in what and when you eat.  Eating smaller meals more often.  Eating low-fat foods.  Eating low-fiber forms of high-fiber foods, such as cooked vegetables instead of raw vegetables.  Having liquid foods in place of solid foods. Liquid foods are easier to digest.  Medicines. These may be given to control nausea and vomiting and to stimulate stomach muscles.  Getting food through a feeding tube. This may be done in severe cases.  A gastric neurostimulator. This is a device that is inserted into the body with surgery. It helps improve stomach emptying and control nausea and vomiting. HOME CARE INSTRUCTIONS  Follow your health care provider's instructions about exercise  and diet.  Take medicines only as directed by your health care provider. SEEK MEDICAL CARE IF:  Your symptoms do not improve with treatment.  You have new symptoms. SEEK IMMEDIATE MEDICAL  CARE IF:  You have severe abdominal pain that does not improve with treatment.  You have nausea that does not go away.  You cannot keep fluids down.   This information is not intended to replace advice given to you by your health care provider. Make sure you discuss any questions you have with your health care provider.   Document Released: 04/10/2005 Document Revised: 08/25/2014 Document Reviewed: 04/06/2014 Elsevier Interactive Patient Education 2016 Else[employer] Inc[employer]  Orthostatic Hypotension Orthostatic hypotension is a sudden drop in blood pressure. It happens when you quickly stand up from a seated or lying position. You may feel dizzy or light-headed. This can last for just a few seconds or for up to a few minutes. It is usually not a serious problem. However, if this happens frequently or gets worse, it can be a sign of something more serious. CAUSES  Different things can cause orthostatic hypotension, including:   Loss of body fluids (dehydration).  Medicines that lower blood pressure.  Sudden changes in posture, such as standing up quickly after you have been sitting or lying down.  Taking too much of your medicine. SIGNS AND SYMPTOMS   Light-headedness or dizziness.   Fainting or near-fainting.   A fast heart rate.   Weakness.   Feeling tired (fatigue).  DIAGNOSIS  Your health care provider may do several things to help diagnose your condition and identify the cause. These may include:   Taking a medical history and doing a physical exam.  Checking your blood pressure. Your health care provider will check your blood pressure when you are:  Lying down.  Sitting.  Standing.  Using tilt table testing. In this test, you lie down on a table that moves from a lying position to a standing position. You will be strapped onto the table. This test monitors your blood pressure and heart rate when you are in different positions. TREATMENT  Treatment will vary  depending on the cause. Possible treatments include:   Changing the dosage of your medicines.  Wearing compression stockings on your lower legs.  Standing up slowly after sitting or lying down.  Eating more salt.  Eating frequent, small meals.  In some cases, getting IV fluids.  Taking medicine to enhance fluid retention. HOME CARE INSTRUCTIONS  Only take over-the-counter or prescription medicines as directed by your health care provider.  Follow your health care provider's instructions for changing the dosage of your current medicines.  Do not stop or adjust your medicine on your own.  Stand up slowly after sitting or lying down. This allows your body to adjust to the different position.  Wear compression stockings as directed.  Eat extra salt as directed.  Do not add extra salt to your diet unless directed to by your health care provider.  Eat frequent, small meals.  Avoid standing suddenly after eating.  Avoid hot showers or excessive heat as directed by your health care provider.  Keep all follow-up appointments. SEEK MEDICAL CARE IF:  You continue to feel dizzy or light-headed after standing.  You feel groggy or confused.  You feel cold, clammy, or sick to your stomach (nauseous).  You have blurred vision.  You feel short of breath. SEEK IMMEDIATE MEDICAL CARE IF:   You faint after standing.  You have chest pain.  You have difficulty breathing.   You lose feeling or movement in your arms or legs.   You have slurred speech or difficulty talking, or you are unable to talk.  MAKE SURE YOU:   Understand these instructions.  Will watch your condition.  Will get help right away if you are not doing well or get worse.   This information is not intended to replace advice given to you by your health care provider. Make sure you discuss any questions you have with your health care provider.   Document Released: 03/31/2002 Document Revised:  04/15/2013 Document Reviewed: 01/31/2013 Elsevier Interactive Patient Education [employer].

## 2015-03-28 NOTE — Progress Notes (Signed)
Subjective: Reports she is feeling much better today. She was able to tolerate PO intake yesterday. Abdominal pain has improved. Denies N/V. She is complaining of occasional right knee swelling and "locking-up" that has caused a couple of falls recently.  Objective: Vital signs in last 24 hours: Filed Vitals:   03/27/15 2152 03/28/15 0002 03/28/15 0553 03/28/15 0730  BP: 102/61 122/65 116/75   Pulse: 113 106 117   Temp: 98.7 F (37.1 C)  98.6 F (37 C)   TempSrc: Oral  Oral   Resp: 20 18    Height:      Weight:      SpO2: 99% 100% 100% 100%   Weight change:   Intake/Output Summary (Last 24 hours) at 03/28/15 1014 Last data filed at 03/27/15 2200  Gross per 24 hour  Intake    120 ml  Output      0 ml  Net    120 ml   PHYSICAL EXAM  General Apperance: NAD HEENT: Normocephalic, atraumatic Neck: Supple, trachea midline Lungs: Clear to auscultation bilaterally. No wheezes, rhonchi or rales. Breathing comfortably Heart: Regular rate and rhythm, no murmur/rub/gallop Abdomen: Soft, mild tenderness in LLQ to palpation, nondistended, no rebound/guarding Extremities: Normal, atraumatic, warm and well perfused, no edema Skin: No rashes or lesions Neurologic: Alert and oriented x 3. No focal deficits.  Lab Results: Basic Metabolic Panel:  Recent Labs Lab 03/26/15 1637 03/28/15 0607  NA 134* 136  K 3.9 3.6  CL 99* 106  CO2 25 24  GLUCOSE 302* 262*  BUN 13 6  CREATININE 1.01* 0.99  CALCIUM 9.8 8.4*  MG  --  1.7   Liver Function Tests:  Recent Labs Lab 03/26/15 1637  AST 14*  ALT 8*  ALKPHOS 68  BILITOT 0.6  PROT 8.5*  ALBUMIN 3.1*    Recent Labs Lab 03/26/15 1637  LIPASE 28   CBC:  Recent Labs Lab 03/27/15 0828 03/28/15 0607  WBC 13.9* 10.3  HGB 8.8* 9.5*  HCT 26.0* 29.1*  MCV 69.9* 70.6*  PLT 513* 550*   CBG:  Recent Labs Lab 03/27/15 1639 03/27/15 2031 03/28/15 0001 03/28/15 0405 03/28/15 0702 03/28/15 0755  GLUCAP 325* 165* 302*  298* 216* 198*   Urine Drug Screen: Drugs of Abuse     Component Value Date/Time   LABOPIA POSITIVE* 11/11/2014 1658   LABOPIA NEGATIVE 02/21/2010 2201   COCAINSCRNUR POSITIVE* 11/11/2014 1658   COCAINSCRNUR NEGATIVE 02/21/2010 2201   LABBENZ NONE DETECTED 11/11/2014 1658   LABBENZ NEGATIVE 02/21/2010 2201   AMPHETMU NONE DETECTED 11/11/2014 1658   AMPHETMU NEGATIVE 02/21/2010 2201   THCU NONE DETECTED 11/11/2014 1658   LABBARB NONE DETECTED 11/11/2014 1658     Micro Results: No results found for this or any previous visit (from the past 240 hour(s)).   Studies/Results: No results found.   Medications: I have reviewed the patient's current medications. Scheduled Meds: . atorvastatin  80 mg Oral q1800  . ferrous sulfate  325 mg Oral Q breakfast  . gabapentin  300 mg Oral TID  . heparin  5,000 Units Subcutaneous 3 times per day  . insulin aspart  0-15 Units Subcutaneous 6 times per day  . labetalol  100 mg Oral BID  . metoCLOPramide  10 mg Oral TID AC  . mometasone-formoterol  2 puff Inhalation BID  . pantoprazole  40 mg Oral Daily   Continuous Infusions: . sodium chloride 50 mL/hr at 03/27/15 1717   PRN Meds:.acetaminophen, albuterol, promethazine Assessment/Plan: 13F  with uncontrolled DM2 who presents with nausea and vomiting x 3-4 x days.   Gastroparesis: Likely cause of abdominal pain.  Symptoms improved now that she has been restarted on Reglan.  Orthostatics this am were negative for BP drop but did have increase in HR from 111 to 140. Did report mild dizziness. Likely some autonomic dysfunction from longstanding uncontrolled DM that was exacerbated on admission 2/2 dehydration. -Advance diet as tolerated -NS@150ml /hr -Scheduled Reglan -Phenergan prn -Protonix 40mg  daily -Avoid narcotics  DM2: A1c 12.9% on 10/13/2014 -Hemoglobin A1c pending -SSI -Continue home gabapentin 300mg  TID -Care management has arranged for medication assistance  Microcytic anemia:  Likely related to fibroids and menorrhagia. Hemoglobin 13.5 on 7/22. 11 on admission. Down to 9.5 this morning - likely dilutional as she is receiving IV fluids. Previous iron 12, TIBC 330 and ferritin 15. -Ferrous sulfate 325mg  daily  Leukocytosis: Chronic leukocytosis with WBC baseline around 15. 16.8 on admission now down to 10.3.  Hypertension: Continue labetalol 100mg  BID  VTE ppx: subq hep  Dispo: Disposition is deferred at this time, awaiting improvement of current medical problems.  Anticipated discharge in approximately 0-1 day(s).   The patient does have a current PCP Collier Salina, MD) and does need an [facility] hospital follow-up appointment after discharge.  The patient does not have transportation limitations that hinder transportation to clinic appointments.  .Services Needed at time of discharge: Y = Yes, Blank = No PT:   OT:   RN:   Equipment:   Other:       Maryellen Pile, MD 03/28/2015, 10:14 AM

## 2015-03-28 NOTE — Progress Notes (Signed)
Shift progressed uneventfully. Patient slept well overnight and denied any pain. Orthostatic vital signs obtained this morning showed no evidence of significant postural change in BP or HR.

## 2015-03-28 NOTE — Discharge Summary (Signed)
Name: Shirley [name] MRN: [id number] DOB: June 16, 1975 39 y.o. PCP: Collier Salina, MD  Date of Admission: 03/26/2015  4:32 PM Date of Discharge: 03/28/2015 Attending Physician: Sid Falcon, MD  Discharge Diagnosis: Active Problems:   Dehydration   Nausea & vomiting   Gastroparesis due to DM [facility])  Discharge Medications:   Medication List    STOP taking these medications        insulin aspart 100 UNIT/ML FlexPen  Commonly known as:  NOVOLOG FLEXPEN      TAKE these medications        ADVAIR DISKUS 100-50 MCG/DOSE Aepb  Generic drug:  Fluticasone-Salmeterol  Inhale 1 puff into the lungs 2 (two) times daily.     albuterol 108 (90 BASE) MCG/ACT inhaler  Commonly known as:  PROVENTIL HFA;VENTOLIN HFA  Inhale 2 puffs into the lungs every 4 (four) hours as needed for wheezing or shortness of breath.     atorvastatin 80 MG tablet  Commonly known as:  LIPITOR  Take 1 tablet (80 mg total) by mouth daily at 6 PM.     atorvastatin 80 MG tablet  Commonly known as:  LIPITOR  Take 1 tablet (80 mg total) by mouth daily at 6 PM.     ferrous sulfate 325 (65 FE) MG tablet  Take 1 tablet (325 mg total) by mouth daily with breakfast.     gabapentin 300 MG capsule  Commonly known as:  NEURONTIN  Take 1 capsule (300 mg total) by mouth 3 (three) times daily.     Insulin Glargine 100 UNIT/ML Solostar Pen  Commonly known as:  LANTUS SOLOSTAR  Inject 20 Units into the skin daily at 10 pm.     Insulin Pen Needle 32G X 4 MM Misc  Commonly known as:  BD PEN NEEDLE NANO U/F  USE AS DIRECTED     labetalol 100 MG tablet  Commonly known as:  NORMODYNE  Take 1 tablet (100 mg total) by mouth 2 (two) times daily.     LORazepam 1 MG tablet  Commonly known as:  ATIVAN  Take 1 mg by mouth every 8 (eight) hours as needed for anxiety.     metoCLOPramide 10 MG tablet  Commonly known as:  REGLAN  Take 1 tablet (10 mg total) by mouth 3 (three) times daily before meals.     omeprazole  40 MG capsule  Commonly known as:  PRILOSEC  Take 1 capsule (40 mg total) by mouth daily.     promethazine 12.5 MG tablet  Commonly known as:  PHENERGAN  Take 1 tablet (12.5 mg total) by mouth every 6 (six) hours as needed for nausea or vomiting.        Disposition and follow-up:   Shirley [name] was discharged from [facility] in Stable condition.  At the hospital follow up visit please address:  1.  Gastroparesis: She was given a new Rx for Reglan and Phenergan prn. Provided with [place] letter to cover prescription. Please make sure she has filled her medications and assess for any further abdominal pain. 2. DM: A1C 11.7. Has been unable to afford medications. Restarted on Lantus 20 qhs. She does not have a meter, please check with [employer] about supplying a new meter for her. Previously on Lantus 24 qhs and Novolog 2 tid. Consider restarting meal time cover if able to get meter provided.   2.  Labs / imaging needed at time of follow-up: None  3.  Pending  labs/ test needing follow-up: None  Follow-up Appointments: Follow-up Information    Follow up with Hinton Lovely, MD. Schedule an appointment as soon as possible for a visit in 1 week.   Specialty:  Internal Medicine   Contact information:   [place] [id number] [phone]       Discharge Instructions: Discharge Instructions    Diet - low sodium heart healthy    Complete by:  As directed      Increase activity slowly    Complete by:  As directed            Consultations:    Procedures Performed:  Ct Abdomen Pelvis W Contrast  03/30/2015  CLINICAL DATA:  Left lower quadrant pain for 1 week. Nausea and vomiting. EXAM: CT ABDOMEN AND PELVIS WITH CONTRAST TECHNIQUE: Multidetector CT imaging of the abdomen and pelvis was performed using the standard protocol following bolus administration of intravenous contrast. CONTRAST:  153mL OMNIPAQUE IOHEXOL 300 MG/ML  SOLN COMPARISON:   12/31/2013 FINDINGS: Lower chest:  No acute findings. Hepatobiliary: No masses or other significant abnormality. Gallbladder is unremarkable. Pancreas: No mass, inflammatory changes, or other significant abnormality. Spleen: Within normal limits in size and appearance. Adrenals/Urinary Tract: No masses identified. No evidence of hydronephrosis. Stomach/Bowel: No evidence of obstruction, inflammatory process, or abnormal fluid collections. Normal appendix visualized. Vascular/Lymphatic: No pathologically enlarged lymph nodes. No evidence of abdominal aortic aneurysm. Reproductive: Stable prominence of myometrium in the posterior corpus which may represent a fibroid or adenomyosis. Other: None. Musculoskeletal:  No suspicious bone lesions identified. IMPRESSION: No acute findings within the abdomen or pelvis. Question posterior uterine fibroid versus adenomyosis. Consider pelvic ultrasound for further evaluation if clinically warranted. Electronically Signed   By: Earle Gell M.D.   On: 03/30/2015 19:45    Admission HPI: This is a 39 yo lady with HTN, chronic ischemic heart disease, IDDM with gastroparesis, depression who is here for left sided abdominal pain that started about 5 days back And which she describes as waxing and waning in nature. She says she also intermittently had bloody emesis. She usually gets gastroparesis type pain once a week or so and it hats 1-2 days, but this time it was worse. Her abdominal pain is 10/10, and it is left sided, and it does not radiate anywhere.  She states that she has not eaten in the last 4 days and her last full meal was on thanksgiving day.   She has a difficult social situation in that she currently does not have insurance and lost medicaid, and so she is unable to buy any of her meds, and has not been taking her insulin for the past 3-4 months, and also phenergan which usually helps with the gastroparesis.  Patient denies fevers, chills, back pain, weight  changes, headaches, vaginal discharge, hematuria, hematochezia, melena. Her LMP was on Nov 5th. She describes her periods as heavy and lasting 7 days and she was diagnosed with fibroids in [place].   She had recent admissions for gastroparesis in August at [facility], and also in July for n/v here. She did not undergo gastric emptying study as she was allergic to eggs. Most recent ER visit was last [place]  In the ER, she was found to be tachycardic and in considerable pain so IMTS was called.  Hospital Course by problem list: Active Problems:   Dehydration   Nausea & vomiting   Gastroparesis due to DM [facility])   Gastroparesis: Shirley [name] presented with left sided abdominal pain for 1 week with associated nausea and vomiting. Reported that the pain was similar in nature to previous gastroparesis pains. She normally gets the symptoms around once a week and it lasts about 2 days, but this time it persistent for the past week. She has not been able to afford her medications, including insulin, for the last 3-4 months and this has caused significant distress and likely contributed to this severe flare. She was recently admitted for DKA and similar symptoms to [facility]. Symptoms improved with Reglan. Case management provided here with a [place] letter to cover prescriptions at discharge. She was discharged with a new Rx for Reglan.   DM2: A1c done 12/3 was 11.7. She reports not being able to afford her medications for the past 3-4 months. She was controlled with SSI during hospitalization. Per chart review she was suppose to be on Lantus 24 qhs and Novolog 2 untis tid. She does not have a meter at home. She was given a new Rx for her lantus and needles. Discharged on Lantus 20 qhs with no mealtime coverage until she is able to get a new meter. Repots intolerance to metformin in the past. Will have close follow up in clinic.   Orthostatic Hypotension: She had +othostatics with BP standing  112/82 and lying 139/88 on admission. Improved with IVF. Repeat orthostatics were negative for BP drop but did have increase in HR from 111 to 140. Did report mild dizziness. Likely some autonomic dysfunction from longstanding uncontrolled DM that was exacerbated on admission 2/2 dehydration.  Microcytic anemia: Likely related to fibroids and menorrhagia. Hemoglobin 13.5 on 7/22. 11 on admission. Trended down to 9.5, likely dilutional following IV fluids. Previous iron 12, TIBC 330 and ferritin 15. Restarted ferrous sulfate 325mg  daily.  Leukocytosis: Chronic leukocytosis with WBC baseline around 15. 16.8 on admission that trended down to 10.3.  Hypertension: Continued labetalol 100mg  BID  Discharge Vitals:   BP 146/68 mmHg  Pulse 117  Temp(Src) 98.5 F (36.9 C) (Oral)  Resp 18  Ht 5\' 7"  (1.702 m)  Wt 213 lb (96.616 kg)  BMI 33.35 kg/m2  SpO2 100%  Discharge Labs:  No results found for this or any previous visit (from the past 24 hour(s)).  Signed: Maryellen Pile, MD 03/30/2015, 8:12 PM

## 2015-03-29 LAB — HEMOGLOBIN A1C
HEMOGLOBIN A1C: 11.7 % — AB (ref 4.8–5.6)
Mean Plasma Glucose: 289 mg/dL

## 2015-03-30 ENCOUNTER — Encounter (HOSPITAL_COMMUNITY): Payer: Self-pay | Admitting: *Deleted

## 2015-03-30 ENCOUNTER — Emergency Department (HOSPITAL_COMMUNITY)
Admission: EM | Admit: 2015-03-30 | Discharge: 2015-03-30 | Disposition: A | Discharge status: Home / Self Care | Payer: Self-pay | Attending: Emergency Medicine | Admitting: Emergency Medicine

## 2015-03-30 ENCOUNTER — Emergency Department (HOSPITAL_COMMUNITY): Payer: Medicaid Other

## 2015-03-30 DIAGNOSIS — R1115 Cyclical vomiting syndrome unrelated to migraine: Secondary | ICD-10-CM

## 2015-03-30 DIAGNOSIS — F419 Anxiety disorder, unspecified: Secondary | ICD-10-CM | POA: Insufficient documentation

## 2015-03-30 DIAGNOSIS — Z79899 Other long term (current) drug therapy: Secondary | ICD-10-CM | POA: Insufficient documentation

## 2015-03-30 DIAGNOSIS — Z8619 Personal history of other infectious and parasitic diseases: Secondary | ICD-10-CM | POA: Insufficient documentation

## 2015-03-30 DIAGNOSIS — F329 Major depressive disorder, single episode, unspecified: Secondary | ICD-10-CM | POA: Insufficient documentation

## 2015-03-30 DIAGNOSIS — Z3202 Encounter for pregnancy test, result negative: Secondary | ICD-10-CM | POA: Insufficient documentation

## 2015-03-30 DIAGNOSIS — F17210 Nicotine dependence, cigarettes, uncomplicated: Secondary | ICD-10-CM | POA: Insufficient documentation

## 2015-03-30 DIAGNOSIS — I2510 Atherosclerotic heart disease of native coronary artery without angina pectoris: Secondary | ICD-10-CM | POA: Insufficient documentation

## 2015-03-30 DIAGNOSIS — Z872 Personal history of diseases of the skin and subcutaneous tissue: Secondary | ICD-10-CM | POA: Insufficient documentation

## 2015-03-30 DIAGNOSIS — M797 Fibromyalgia: Secondary | ICD-10-CM | POA: Insufficient documentation

## 2015-03-30 DIAGNOSIS — Z87448 Personal history of other diseases of urinary system: Secondary | ICD-10-CM | POA: Insufficient documentation

## 2015-03-30 DIAGNOSIS — I10 Essential (primary) hypertension: Secondary | ICD-10-CM | POA: Insufficient documentation

## 2015-03-30 DIAGNOSIS — G43909 Migraine, unspecified, not intractable, without status migrainosus: Secondary | ICD-10-CM | POA: Insufficient documentation

## 2015-03-30 DIAGNOSIS — E669 Obesity, unspecified: Secondary | ICD-10-CM | POA: Insufficient documentation

## 2015-03-30 DIAGNOSIS — K3184 Gastroparesis: Secondary | ICD-10-CM | POA: Insufficient documentation

## 2015-03-30 DIAGNOSIS — K219 Gastro-esophageal reflux disease without esophagitis: Secondary | ICD-10-CM | POA: Insufficient documentation

## 2015-03-30 DIAGNOSIS — G629 Polyneuropathy, unspecified: Secondary | ICD-10-CM | POA: Insufficient documentation

## 2015-03-30 DIAGNOSIS — E119 Type 2 diabetes mellitus without complications: Secondary | ICD-10-CM | POA: Insufficient documentation

## 2015-03-30 DIAGNOSIS — I252 Old myocardial infarction: Secondary | ICD-10-CM | POA: Insufficient documentation

## 2015-03-30 DIAGNOSIS — Z794 Long term (current) use of insulin: Secondary | ICD-10-CM | POA: Insufficient documentation

## 2015-03-30 DIAGNOSIS — E785 Hyperlipidemia, unspecified: Secondary | ICD-10-CM | POA: Insufficient documentation

## 2015-03-30 DIAGNOSIS — F509 Eating disorder, unspecified: Secondary | ICD-10-CM | POA: Insufficient documentation

## 2015-03-30 DIAGNOSIS — Z8742 Personal history of other diseases of the female genital tract: Secondary | ICD-10-CM | POA: Insufficient documentation

## 2015-03-30 DIAGNOSIS — Z8673 Personal history of transient ischemic attack (TIA), and cerebral infarction without residual deficits: Secondary | ICD-10-CM | POA: Insufficient documentation

## 2015-03-30 DIAGNOSIS — G43A1 Cyclical vomiting, intractable: Secondary | ICD-10-CM | POA: Insufficient documentation

## 2015-03-30 DIAGNOSIS — J45909 Unspecified asthma, uncomplicated: Secondary | ICD-10-CM | POA: Insufficient documentation

## 2015-03-30 LAB — CBC
HEMATOCRIT: 34.1 % — AB (ref 36.0–46.0)
HEMOGLOBIN: 11.4 g/dL — AB (ref 12.0–15.0)
MCH: 23.3 pg — AB (ref 26.0–34.0)
MCHC: 33.4 g/dL (ref 30.0–36.0)
MCV: 69.7 fL — AB (ref 78.0–100.0)
Platelets: 675 10*3/uL — ABNORMAL HIGH (ref 150–400)
RBC: 4.89 MIL/uL (ref 3.87–5.11)
RDW: 17 % — ABNORMAL HIGH (ref 11.5–15.5)
WBC: 15.1 10*3/uL — ABNORMAL HIGH (ref 4.0–10.5)

## 2015-03-30 LAB — COMPREHENSIVE METABOLIC PANEL
ALBUMIN: 3.3 g/dL — AB (ref 3.5–5.0)
ALK PHOS: 67 U/L (ref 38–126)
ALT: 9 U/L — ABNORMAL LOW (ref 14–54)
ANION GAP: 13 (ref 5–15)
AST: 20 U/L (ref 15–41)
BUN: 7 mg/dL (ref 6–20)
CALCIUM: 10 mg/dL (ref 8.9–10.3)
CO2: 24 mmol/L (ref 22–32)
Chloride: 99 mmol/L — ABNORMAL LOW (ref 101–111)
Creatinine, Ser: 0.82 mg/dL (ref 0.44–1.00)
GFR calc Af Amer: >60 mL/min (ref >60)
GFR calc non Af Amer: >60 mL/min (ref >60)
GLUCOSE: 253 mg/dL — AB (ref 65–99)
POTASSIUM: 3.9 mmol/L (ref 3.5–5.1)
SODIUM: 136 mmol/L (ref 135–145)
Total Bilirubin: 0.4 mg/dL (ref 0.3–1.2)
Total Protein: 9.1 g/dL — ABNORMAL HIGH (ref 6.5–8.1)

## 2015-03-30 LAB — I-STAT BETA HCG BLOOD, ED (MC, WL, AP ONLY): I-stat hCG, quantitative: <5 m[IU]/mL (ref <5)

## 2015-03-30 LAB — POC URINE PREG, ED: Preg Test, Ur: NEGATIVE

## 2015-03-30 LAB — URINALYSIS, ROUTINE W REFLEX MICROSCOPIC
BILIRUBIN URINE: NEGATIVE
Glucose, UA: >1000 mg/dL — AB
Ketones, ur: 40 mg/dL — AB
Nitrite: NEGATIVE
PH: 8 (ref 5.0–8.0)
Protein, ur: 100 mg/dL — AB
SPECIFIC GRAVITY, URINE: 1.015 (ref 1.005–1.030)

## 2015-03-30 LAB — URINE MICROSCOPIC-ADD ON: Squamous Epithelial / LPF: NONE SEEN

## 2015-03-30 LAB — LIPASE, BLOOD: Lipase: 33 U/L (ref 11–51)

## 2015-03-30 MED ORDER — DIPHENHYDRAMINE HCL 50 MG/ML IJ SOLN
25.0000 mg | Freq: Once | INTRAMUSCULAR | Status: AC
  Administered 2015-03-30: 25 mg via INTRAVENOUS
  Filled 2015-03-30: qty 1

## 2015-03-30 MED ORDER — SODIUM CHLORIDE 0.9 % IV SOLN: 1000.0000 mL | INTRAVENOUS | Status: DC

## 2015-03-30 MED ORDER — METOCLOPRAMIDE HCL 5 MG/ML IJ SOLN
10.0000 mg | Freq: Once | INTRAMUSCULAR | Status: AC
  Administered 2015-03-30: 10 mg via INTRAVENOUS
  Filled 2015-03-30: qty 2

## 2015-03-30 MED ORDER — SODIUM CHLORIDE 0.9 % IV SOLN
1000.0000 mL | Freq: Once | INTRAVENOUS | Status: AC
  Administered 2015-03-30: 1000 mL via INTRAVENOUS

## 2015-03-30 MED ORDER — SODIUM CHLORIDE 0.9 % IV BOLUS (SEPSIS)
1000.0000 mL | Freq: Once | INTRAVENOUS | Status: AC
  Administered 2015-03-30: 1000 mL via INTRAVENOUS

## 2015-03-30 MED ORDER — HYDROMORPHONE HCL 1 MG/ML IJ SOLN
1.0000 mg | Freq: Once | INTRAMUSCULAR | Status: AC
  Administered 2015-03-30: 1 mg via INTRAVENOUS
  Filled 2015-03-30: qty 1

## 2015-03-30 MED ORDER — HALOPERIDOL LACTATE 5 MG/ML IJ SOLN
5.0000 mg | Freq: Once | INTRAMUSCULAR | Status: AC
  Administered 2015-03-30: 5 mg via INTRAVENOUS
  Filled 2015-03-30: qty 1

## 2015-03-30 MED ORDER — ONDANSETRON 4 MG PO TBDP
ORAL_TABLET | ORAL | Status: AC
  Filled 2015-03-30: qty 1

## 2015-03-30 MED ORDER — PANTOPRAZOLE SODIUM 40 MG IV SOLR
40.0000 mg | Freq: Once | INTRAVENOUS | Status: AC
  Administered 2015-03-30: 40 mg via INTRAVENOUS
  Filled 2015-03-30: qty 40

## 2015-03-30 MED ORDER — ONDANSETRON HCL 4 MG/2ML IJ SOLN
4.0000 mg | Freq: Once | INTRAMUSCULAR | Status: AC
  Administered 2015-03-30: 4 mg via INTRAVENOUS
  Filled 2015-03-30: qty 2

## 2015-03-30 MED ORDER — IOHEXOL 300 MG/ML  SOLN
100.0000 mL | Freq: Once | INTRAMUSCULAR | Status: AC | PRN
  Administered 2015-03-30: 100 mL via INTRAVENOUS

## 2015-03-30 MED ORDER — ONDANSETRON 4 MG PO TBDP
4.0000 mg | ORAL_TABLET | Freq: Once | ORAL | Status: AC | PRN
  Administered 2015-03-30: 4 mg via ORAL

## 2015-03-30 NOTE — ED Provider Notes (Signed)
CSN: [id number]     Arrival date & time 03/30/15  1145 History   First MD Initiated Contact with Patient 03/30/15 1343     Chief Complaint  Patient presents with  . Abdominal Pain     (Consider location/radiation/quality/duration/timing/severity/associated sxs/prior Treatment) HPI patient reports that she's had constant vomiting this morning. She reports she had abdominal pain that cramping and constant in nature for 2 days. She has no fever with this. She has not had diarrhea. She denies constipation. Her pain burning or urgency urination. She has had similar symptoms in the past with recurrent gastroparesis. She reports symptoms are very similar today.  Past Medical History  Diagnosis Date  . Thrombocytosis ([place])     Hem/Onc suggested 2/2 chronic hepatits and/or iron deficiency anemia  . Iron deficiency anemia   . N&V (nausea and vomiting)     Chronic. Unclear etiology with multiple admission and ED visits. CT abdomen with and without contrast (02/2011)  showed no acute process. Gastic Emptying scan (01/2010) was normal. Ultrasound of the abdomen was within normal limits. Hepatitis B viral load was undectable. HIV NR. EGD - gastritis, Hpylori + s/p Rx  . Hypertension   . Hyperlipidemia   . Diabetes mellitus type 2, uncontrolled, with complications ([place])   . Recurrent boils   . 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 Cath 12/10- no restenosis or significant CAD progression  . Irregular menses     Small ovarian follicles seen on [id number])  . History of pyelonephritis     H/o GrpB Pyelonephritis (9/06) and UTI- 07/11- E.Coli, 12/10- GBS  . Abscess of tunica vaginalis     10/09- Abundant S. aureus- sensitive to all abx  . Obesity   . Gastritis   . Depression   . Peripheral neuropathy ([place])   . Fibromyalgia   . GERD (gastroesophageal reflux disease)   . Anxiety   .  Gastroparesis     secondary to poorly controlled DM, last emptying study performed 01/2010  was normal but may be falsely positive as pt was on reglan  . Blood dyscrasia   . Substance abuse   . MI (myocardial infarction) ([place]) 05/2006    PDA percutaneous transluminal coronary angioplasty  . OSA (obstructive sleep apnea)     "suppose to wear mask but I don't" (04/22/2014)  . Seasonal asthma   . Hepatitis B, chronic (HCC)     Hep BeAb+,Hep B cAb+ & Hep BsAg+ (9/06)  . Migraine     "weekly" (04/22/2014)  . CVA (cerebral vascular accident) ([place]) ~ 02/2014    denies residual on 04/22/2014  . CVA (cerebral vascular accident) [facility])     history of remote right cerebellar infarct noted on head CT at least since 10/2011  . Pneumonia     "this is probably the 2nd or 3rd time I've had pneumonia" (04/22/2014)   Past Surgical History  Procedure Laterality Date  . Cesarean section  1997  . Coronary angioplasty with stent placement  2008    "2 stents"  . Esophagogastroduodenoscopy N/A 04/23/2014    Procedure: ESOPHAGOGASTRODUODENOSCOPY (EGD);  Surgeon: Winfield Cunas., MD;  Location: [facility] ENDOSCOPY;  Service: Endoscopy;  Laterality: N/A;   Family History  Problem Relation Age of Onset  . Diabetes Father    Social History  Substance Use Topics  . Smoking status: Former Smoker    Types: Cigarettes  Quit date: 04/24/1996  . Smokeless tobacco: Never Used     Comment: quit smoking cigarettes age 89  . Alcohol Use: 0.0 oz/week    0 Standard drinks or equivalent per week     Comment: 04/22/2014 "might have a few drinks a month"   OB History    No data available     Review of Systems 10 Systems reviewed and are negative for acute change except as noted in the HPI.    Allergies  Eggs or egg-derived products; Lisinopril; and Morphine and related  Home Medications   Prior to Admission medications   Medication Sig Start Date End Date Taking? Authorizing Provider  ADVAIR DISKUS  100-50 MCG/DOSE AEPB Inhale 1 puff into the lungs 2 (two) times daily. Patient not taking: Reported on 12/22/2014 09/17/14   Luan Moore, MD  albuterol (PROVENTIL HFA;VENTOLIN HFA) 108 (90 BASE) MCG/ACT inhaler Inhale 2 puffs into the lungs every 4 (four) hours as needed for wheezing or shortness of breath. Patient not taking: Reported on 12/22/2014 04/26/14   Alexa Sherral Hammers, MD  atorvastatin (LIPITOR) 80 MG tablet Take 1 tablet (80 mg total) by mouth daily at 6 PM. Patient not taking: Reported on 12/22/2014 07/25/13   Dixon Boos, MD  atorvastatin (LIPITOR) 80 MG tablet Take 1 tablet (80 mg total) by mouth daily at 6 PM. 03/28/15   Maryellen Pile, MD  ferrous sulfate 325 (65 FE) MG tablet Take 1 tablet (325 mg total) by mouth daily with breakfast. 03/28/15   Maryellen Pile, MD  gabapentin (NEURONTIN) 300 MG capsule Take 1 capsule (300 mg total) by mouth 3 (three) times daily. 03/28/15   Maryellen Pile, MD  Insulin Glargine (LANTUS SOLOSTAR) 100 UNIT/ML Solostar Pen Inject 20 Units into the skin daily at 10 pm. 03/28/15   Maryellen Pile, MD  Insulin Pen Needle (BD PEN NEEDLE NANO U/F) 32G X 4 MM MISC USE AS DIRECTED 03/28/15   Maryellen Pile, MD  labetalol (NORMODYNE) 100 MG tablet Take 1 tablet (100 mg total) by mouth 2 (two) times daily. 03/28/15   Maryellen Pile, MD  LORazepam (ATIVAN) 1 MG tablet Take 1 mg by mouth every 8 (eight) hours as needed for anxiety.    Historical Provider, MD  metoCLOPramide (REGLAN) 10 MG tablet Take 1 tablet (10 mg total) by mouth 3 (three) times daily before meals. 03/28/15   Maryellen Pile, MD  omeprazole (PRILOSEC) 40 MG capsule Take 1 capsule (40 mg total) by mouth daily. 03/28/15   Maryellen Pile, MD  promethazine (PHENERGAN) 12.5 MG tablet Take 1 tablet (12.5 mg total) by mouth every 6 (six) hours as needed for nausea or vomiting. 03/28/15   Maryellen Pile, MD   BP 211/150 mmHg  Pulse 135  Temp(Src) 98.2 F (36.8 C) (Oral)  Resp 22  Ht 5\' 7"  (1.702 m)  Wt 200  lb (90.719 kg)  BMI 31.32 kg/m2  SpO2 98% Physical Exam  Constitutional: She is oriented to person, place, and time.  Patient is retching and writhing around in the bed. No respiratory distress.  HENT:  Head: Normocephalic and atraumatic.  Nose: Nose normal.  Mouth/Throat: Oropharynx is clear and moist.  Eyes: EOM are normal. Pupils are equal, round, and reactive to light.  Cardiovascular: Intact distal pulses.   Tachycardia.  Pulmonary/Chest: Effort normal and breath sounds normal.  Abdominal: Soft.  Abdomen is soft. Patient endorses tenderness throughout to palpation. No guarding.  Musculoskeletal: Normal range of motion. She exhibits no edema or tenderness.  Neurological: She is alert and oriented to person, place, and time. Coordination normal.  Skin: Skin is warm and dry.  Psychiatric:  Patient is anxious and tearful.    ED Course  Procedures (including critical care time) Labs Review Labs Reviewed  COMPREHENSIVE METABOLIC PANEL - Abnormal; Notable for the following:    Chloride 99 (*)    Glucose, Bld 253 (*)    Total Protein 9.1 (*)    Albumin 3.3 (*)    ALT 9 (*)    All other components within normal limits  CBC - Abnormal; Notable for the following:    WBC 15.1 (*)    Hemoglobin 11.4 (*)    HCT 34.1 (*)    MCV 69.7 (*)    MCH 23.3 (*)    RDW 17.0 (*)    Platelets 675 (*)    All other components within normal limits  LIPASE, BLOOD  URINALYSIS, ROUTINE W REFLEX MICROSCOPIC (NOT AT [facility])    Imaging Review No results found. I have personally reviewed and evaluated these images and lab results as part of my medical decision-making.   EKG Interpretation None      MDM   Final diagnoses:  Gastroparesis  Intractable cyclical vomiting with nausea   At this time patient continues to have recurrent retching and vomiting. She has had Reglan and Benadryl Zofran. We'll continue symptomatically treatment and then be reassessed by Dr. Tyrone Nine to determine the  patient responds appropriately and can be discharged for further evaluation and intervention is required.    Charlesetta Shanks, MD 04/05/15 [phone]

## 2015-03-30 NOTE — ED Notes (Signed)
Patient verbalized understanding of discharge instructions and denies any further needs or questions at this time. VS stable. Patient ambulatory with steady gait.  

## 2015-03-30 NOTE — ED Notes (Signed)
Patient transported to CT 

## 2015-03-30 NOTE — Discharge Instructions (Signed)
Gastroparesis °Gastroparesis, also called delayed gastric emptying, is a condition in which food takes longer than normal to empty from the stomach. The condition is usually long-lasting (chronic). °CAUSES °This condition may be caused by: °· An endocrine disorder, such as hypothyroidism or diabetes. Diabetes is the most common cause of this condition. °· A nervous system disease, such as Parkinson disease or multiple sclerosis. °· Cancer, infection, or surgery of the stomach or vagus nerve. °· A connective tissue disorder, such as scleroderma. °· Certain medicines. °In most cases, the cause is not known. °RISK FACTORS °This condition is more likely to develop in: °· People with certain disorders, including endocrine disorders, eating disorders, amyloidosis, and scleroderma. °· People with certain diseases, including Parkinson disease or multiple sclerosis. °· People with cancer or infection of the stomach or vagus nerve. °· People who have had surgery on the stomach or vagus nerve. °· People who take certain medicines. °· Women. °SYMPTOMS °Symptoms of this condition include: °· An early feeling of fullness when eating. °· Nausea. °· Weight loss. °· Vomiting. °· Heartburn. °· Abdominal bloating. °· Inconsistent blood glucose levels. °· Lack of appetite. °· Acid from the stomach coming up into the esophagus (gastroesophageal reflux). °· Spasms of the stomach. °Symptoms may come and go. °DIAGNOSIS °This condition is diagnosed with tests, such as: °· Tests that check how long it takes food to move through the stomach and intestines. These tests include: °¨ Upper gastrointestinal (GI) series. In this test, X-rays of the intestines are taken after you drink a liquid. The liquid makes the intestines show up better on the X-rays. °¨ Gastric emptying scintigraphy. In this test, scans are taken after you eat food that contains a small amount of radioactive material. °¨ Wireless capsule GI monitoring system. This test  involves swallowing a capsule that records information about movement through the stomach. °· Gastric manometry. This test measures electrical and muscular activity in the stomach. It is done with a thin tube that is passed down the throat and into the stomach. °· Endoscopy. This test checks for abnormalities in the lining of the stomach. It is done with a long, thin tube that is passed down the throat and into the stomach. °· An ultrasound. This test can help rule out gallbladder disease or pancreatitis as a cause of your symptoms. It uses sound waves to take pictures of the inside of your body. °TREATMENT °There is no cure for gastroparesis. This condition may be managed with: °· Treatment of the underlying condition causing the gastroparesis. °· Lifestyle changes, including exercise and dietary changes. Dietary changes can include: °¨ Changes in what and when you eat. °¨ Eating smaller meals more often. °¨ Eating low-fat foods. °¨ Eating low-fiber forms of high-fiber foods, such as cooked vegetables instead of raw vegetables. °¨ Having liquid foods in place of solid foods. Liquid foods are easier to digest. °· Medicines. These may be given to control nausea and vomiting and to stimulate stomach muscles. °· Getting food through a feeding tube. This may be done in severe cases. °· A gastric neurostimulator. This is a device that is inserted into the body with surgery. It helps improve stomach emptying and control nausea and vomiting. °HOME CARE INSTRUCTIONS °· Follow your health care provider's instructions about exercise and diet. °· Take medicines only as directed by your health care provider. °SEEK MEDICAL CARE IF: °· Your symptoms do not improve with treatment. °· You have new symptoms. °SEEK IMMEDIATE MEDICAL CARE IF: °· You have   severe abdominal pain that does not improve with treatment. °· You have nausea that does not go away. °· You cannot keep fluids down. °  °This information is not intended to replace  advice given to you by your health care provider. Make sure you discuss any questions you have with your health care provider. °  °Document Released: 04/10/2005 Document Revised: 08/25/2014 Document Reviewed: 04/06/2014 °Elsevier Interactive Patient Education ©2016 [employer] ° °

## 2015-03-30 NOTE — ED Notes (Signed)
Pt c/o LLQ abd pain, pt reports hospital d/c x 2 days ago, pt reports being hospitalized for 3 days for gastroparesis, pt reports n/v onset last night, pt c/o nausea & x 10-15 vomiting episodes since that time with red blood small amt in her vomit, pt A&O x4, pt denies diarrhea, pt states, "I take Reglan but cannot afford it because I don't have Medicaid."

## 2015-03-31 NOTE — ED Provider Notes (Signed)
Received patient in turnover from Dr. Colvin Caroli.  Patient with hx of multiple similar presentations though secondary to gastroparesis.  Given haldol with significant improvement of symptoms, however patient continuing to be significantly tachycardic.  Complaining of abdominal pain, though when im not in the room is resting comfortably.  Concern for continued tachycardia and stated abdominal pain, CT abd pelvis ordered.  IV Dilaudid with significant improvement of symptoms and tachycardia resolved.  CT negative, patient feeling better and feels ok for discharge.  Question possibility of acute opiate withdrawal as cause of recurrent symptoms.  Deno Etienne, DO 03/31/15 1351

## 2015-04-01 ENCOUNTER — Ambulatory Visit (INDEPENDENT_AMBULATORY_CARE_PROVIDER_SITE_OTHER): Payer: Self-pay | Admitting: Internal Medicine

## 2015-04-01 ENCOUNTER — Encounter: Payer: Self-pay | Admitting: Internal Medicine

## 2015-04-01 ENCOUNTER — Encounter (HOSPITAL_COMMUNITY): Payer: Self-pay

## 2015-04-01 ENCOUNTER — Observation Stay (HOSPITAL_COMMUNITY)
Admission: AD | Admit: 2015-04-01 | Discharge: 2015-04-02 | Disposition: A | Discharge status: Home / Self Care | Payer: Self-pay | Source: Ambulatory Visit | Attending: Internal Medicine | Admitting: Internal Medicine

## 2015-04-01 VITALS — BP 187/107 | HR 149 | Temp 98.4°F | Resp 20

## 2015-04-01 DIAGNOSIS — R1115 Cyclical vomiting syndrome unrelated to migraine: Secondary | ICD-10-CM

## 2015-04-01 DIAGNOSIS — D509 Iron deficiency anemia, unspecified: Secondary | ICD-10-CM | POA: Insufficient documentation

## 2015-04-01 DIAGNOSIS — E1140 Type 2 diabetes mellitus with diabetic neuropathy, unspecified: Secondary | ICD-10-CM | POA: Insufficient documentation

## 2015-04-01 DIAGNOSIS — G43A1 Cyclical vomiting, intractable: Secondary | ICD-10-CM

## 2015-04-01 DIAGNOSIS — R1110 Vomiting, unspecified: Secondary | ICD-10-CM | POA: Diagnosis present

## 2015-04-01 DIAGNOSIS — Z794 Long term (current) use of insulin: Secondary | ICD-10-CM | POA: Insufficient documentation

## 2015-04-01 DIAGNOSIS — F329 Major depressive disorder, single episode, unspecified: Secondary | ICD-10-CM | POA: Insufficient documentation

## 2015-04-01 DIAGNOSIS — Z87891 Personal history of nicotine dependence: Secondary | ICD-10-CM | POA: Insufficient documentation

## 2015-04-01 DIAGNOSIS — I2510 Atherosclerotic heart disease of native coronary artery without angina pectoris: Secondary | ICD-10-CM | POA: Insufficient documentation

## 2015-04-01 DIAGNOSIS — K3184 Gastroparesis: Secondary | ICD-10-CM | POA: Insufficient documentation

## 2015-04-01 DIAGNOSIS — E785 Hyperlipidemia, unspecified: Secondary | ICD-10-CM | POA: Insufficient documentation

## 2015-04-01 DIAGNOSIS — E1143 Type 2 diabetes mellitus with diabetic autonomic (poly)neuropathy: Principal | ICD-10-CM | POA: Insufficient documentation

## 2015-04-01 DIAGNOSIS — I252 Old myocardial infarction: Secondary | ICD-10-CM | POA: Insufficient documentation

## 2015-04-01 LAB — CBC
HCT: 29.5 % — ABNORMAL LOW (ref 36.0–46.0)
Hemoglobin: 9.7 g/dL — ABNORMAL LOW (ref 12.0–15.0)
MCH: 22.8 pg — AB (ref 26.0–34.0)
MCHC: 32.9 g/dL (ref 30.0–36.0)
MCV: 69.4 fL — ABNORMAL LOW (ref 78.0–100.0)
PLATELETS: 528 10*3/uL — AB (ref 150–400)
RBC: 4.25 MIL/uL (ref 3.87–5.11)
RDW: 17.1 % — AB (ref 11.5–15.5)
WBC: 10.7 10*3/uL — AB (ref 4.0–10.5)

## 2015-04-01 LAB — COMPREHENSIVE METABOLIC PANEL
ALT: 9 U/L — AB (ref 14–54)
AST: 16 U/L (ref 15–41)
Albumin: 3 g/dL — ABNORMAL LOW (ref 3.5–5.0)
Alkaline Phosphatase: 54 U/L (ref 38–126)
Anion gap: 10 (ref 5–15)
BUN: 9 mg/dL (ref 6–20)
CHLORIDE: 102 mmol/L (ref 101–111)
CO2: 25 mmol/L (ref 22–32)
CREATININE: 0.91 mg/dL (ref 0.44–1.00)
Calcium: 9.5 mg/dL (ref 8.9–10.3)
GFR calc non Af Amer: >60 mL/min (ref >60)
Glucose, Bld: 200 mg/dL — ABNORMAL HIGH (ref 65–99)
POTASSIUM: 3.7 mmol/L (ref 3.5–5.1)
SODIUM: 137 mmol/L (ref 135–145)
Total Bilirubin: 0.5 mg/dL (ref 0.3–1.2)
Total Protein: 7.4 g/dL (ref 6.5–8.1)

## 2015-04-01 LAB — GLUCOSE, CAPILLARY
GLUCOSE-CAPILLARY: 240 mg/dL — AB (ref 65–99)
Glucose-Capillary: 180 mg/dL — ABNORMAL HIGH (ref 65–99)
Glucose-Capillary: 113 mg/dL — ABNORMAL HIGH (ref 65–99)

## 2015-04-01 MED ORDER — PROMETHAZINE HCL 25 MG/ML IJ SOLN
25.0000 mg | Freq: Once | INTRAMUSCULAR | Status: AC
  Administered 2015-04-01: 25 mg via INTRAMUSCULAR

## 2015-04-01 MED ORDER — PROMETHAZINE HCL 25 MG/ML IJ SOLN
25.0000 mg | Freq: Once | INTRAMUSCULAR | Status: DC
  Filled 2015-04-01: qty 1

## 2015-04-01 MED ORDER — SODIUM CHLORIDE 0.9 % IV BOLUS (SEPSIS)
1000.0000 mL | Freq: Once | INTRAVENOUS | Status: AC
  Administered 2015-04-01: 1000 mL via INTRAVENOUS

## 2015-04-01 MED ORDER — LABETALOL HCL 5 MG/ML IV SOLN
10.0000 mg | INTRAVENOUS | Status: DC | PRN
  Administered 2015-04-01 – 2015-04-02 (×2): 10 mg via INTRAVENOUS
  Filled 2015-04-01 (×3): qty 4

## 2015-04-01 MED ORDER — PROMETHAZINE HCL 25 MG/ML IJ SOLN: 12.5000 mg | Freq: Four times a day (QID) | INTRAMUSCULAR | Status: DC | PRN

## 2015-04-01 MED ORDER — METOCLOPRAMIDE HCL 5 MG/ML IJ SOLN
10.0000 mg | Freq: Three times a day (TID) | INTRAMUSCULAR | Status: DC
  Administered 2015-04-01 – 2015-04-02 (×4): 10 mg via INTRAVENOUS
  Filled 2015-04-01 (×4): qty 2

## 2015-04-01 MED ORDER — ONDANSETRON HCL 4 MG PO TABS: 4.0000 mg | ORAL_TABLET | Freq: Four times a day (QID) | ORAL | Status: DC | PRN

## 2015-04-01 MED ORDER — ENOXAPARIN SODIUM 40 MG/0.4ML ~~LOC~~ SOLN
40.0000 mg | SUBCUTANEOUS | Status: DC
  Administered 2015-04-01: 40 mg via SUBCUTANEOUS
  Filled 2015-04-01: qty 0.4

## 2015-04-01 MED ORDER — INSULIN GLARGINE 100 UNIT/ML ~~LOC~~ SOLN
15.0000 [IU] | Freq: Every day | SUBCUTANEOUS | Status: DC
  Administered 2015-04-01: 15 [IU] via SUBCUTANEOUS
  Filled 2015-04-01 (×2): qty 0.15

## 2015-04-01 MED ORDER — SODIUM CHLORIDE 0.9 % IV SOLN
INTRAVENOUS | Status: DC
  Administered 2015-04-01: 23:00:00 via INTRAVENOUS

## 2015-04-01 MED ORDER — INSULIN ASPART 100 UNIT/ML ~~LOC~~ SOLN
0.0000 [IU] | SUBCUTANEOUS | Status: DC
  Administered 2015-04-01: 2 [IU] via SUBCUTANEOUS

## 2015-04-01 MED ORDER — PANTOPRAZOLE SODIUM 40 MG IV SOLR
40.0000 mg | INTRAVENOUS | Status: DC
  Administered 2015-04-01: 40 mg via INTRAVENOUS
  Filled 2015-04-01: qty 40

## 2015-04-01 MED ORDER — ONDANSETRON HCL 4 MG/2ML IJ SOLN: 4.0000 mg | Freq: Four times a day (QID) | INTRAMUSCULAR | Status: DC | PRN

## 2015-04-01 NOTE — Progress Notes (Signed)
Patient ID: Shirley [name], female   DOB: 11/29/1975, 39 y.o.   MRN: [id number]   Subjective:   Patient ID: Shirley [name] female   DOB: [birth year] 39 y.o.   MRN: [id number]  HPI: Shirley [name] is a 39 y.o. lady with insulin-dependent type 2 diabetes with reported gastroparesis, depression, and hypertension presenting today with nausea and vomiting.  She was discharged 4 days ago for a flare of her gastroparesis. During her hospitalization, her nausea was eventually well controlled on Reglan, she was rehydrated with IV fluids, and discharged after 4 days. However, once she left, she was unable to get any of her medications as she could not afford the $3 cost. Since she was discharged, her intractable nausea and vomiting came back, and feels exactly as it had before she was most recently admitted. Additionally, 2 days ago, she went to the emergency room where she underwent an abdominal CT scan that was unremarkable. She reportedly got much better with Haldol and Dilaudid. The emergency physician questioned whether this was opiate withdrawals, but when I asked her she adamantly refuses taking any opiates. Otherwise, she denies any fevers, chills, alcohol use, dysuria, or other complaints besides her nausea, vomiting, and lightheadedness upon standing.  Past Medical History  Diagnosis Date  . Thrombocytosis ([place])     Hem/Onc suggested 2/2 chronic hepatits and/or iron deficiency anemia  . Iron deficiency anemia   . N&V (nausea and vomiting)     Chronic. Unclear etiology with multiple admission and ED visits. CT abdomen with and without contrast (02/2011)  showed no acute process. Gastic Emptying scan (01/2010) was normal. Ultrasound of the abdomen was within normal limits. Hepatitis B viral load was undectable. HIV NR. EGD - gastritis, Hpylori + s/p Rx  . Hypertension   . Hyperlipidemia   . Diabetes mellitus type 2, uncontrolled, with complications ([place])   . Recurrent boils   . CAD (coronary  artery disease) 06/15/2006    s/p Subendocardial MI with PDA angioplasty(no stent) on 06/15/06 and relook  cath 06/19/06 showed patency of site. Cath 12/10- no restenosis or significant CAD progression  . Irregular menses     Small ovarian follicles seen on [id number])  . History of pyelonephritis     H/o GrpB Pyelonephritis (9/06) and UTI- 07/11- E.Coli, 12/10- GBS  . Abscess of tunica vaginalis     10/09- Abundant S. aureus- sensitive to all abx  . Obesity   . Gastritis   . Depression   . Peripheral neuropathy ([place])   . Fibromyalgia   . GERD (gastroesophageal reflux disease)   . Anxiety   . Gastroparesis     secondary to poorly controlled DM, last emptying study performed 01/2010  was normal but may be falsely positive as pt was on reglan  . Blood dyscrasia   . Substance abuse   . MI (myocardial infarction) ([place]) 05/2006    PDA percutaneous transluminal coronary angioplasty  . OSA (obstructive sleep apnea)     "suppose to wear mask but I don't" (04/22/2014)  . Seasonal asthma   . Hepatitis B, chronic (HCC)     Hep BeAb+,Hep B cAb+ & Hep BsAg+ (9/06)  . Migraine     "weekly" (04/22/2014)  . CVA (cerebral vascular accident) ([place]) ~ 02/2014    denies residual on 04/22/2014  . CVA (cerebral vascular accident) [facility])     history of remote right cerebellar infarct noted on head CT at least since 10/2011  . Pneumonia     "  this is probably the 2nd or 3rd time I've had pneumonia" (04/22/2014)   Current Outpatient Prescriptions  Medication Sig Dispense Refill  . ADVAIR DISKUS 100-50 MCG/DOSE AEPB Inhale 1 puff into the lungs 2 (two) times daily. (Patient not taking: Reported on 12/22/2014) 60 each 2  . albuterol (PROVENTIL HFA;VENTOLIN HFA) 108 (90 BASE) MCG/ACT inhaler Inhale 2 puffs into the lungs every 4 (four) hours as needed for wheezing or shortness of breath. (Patient not taking: Reported on 12/22/2014) 1 Inhaler 4  . atorvastatin (LIPITOR) 80 MG tablet Take 1 tablet (80 mg total) by  mouth daily at 6 PM. (Patient not taking: Reported on 12/22/2014) 30 tablet 2  . atorvastatin (LIPITOR) 80 MG tablet Take 1 tablet (80 mg total) by mouth daily at 6 PM. 30 tablet 0  . ferrous sulfate 325 (65 FE) MG tablet Take 1 tablet (325 mg total) by mouth daily with breakfast. 90 tablet 2  . gabapentin (NEURONTIN) 300 MG capsule Take 1 capsule (300 mg total) by mouth 3 (three) times daily. 90 capsule 0  . Insulin Glargine (LANTUS SOLOSTAR) 100 UNIT/ML Solostar Pen Inject 20 Units into the skin daily at 10 pm. 15 mL 1  . Insulin Pen Needle (BD PEN NEEDLE NANO U/F) 32G X 4 MM MISC USE AS DIRECTED 100 each 6  . labetalol (NORMODYNE) 100 MG tablet Take 1 tablet (100 mg total) by mouth 2 (two) times daily. 60 tablet 0  . LORazepam (ATIVAN) 1 MG tablet Take 1 mg by mouth every 8 (eight) hours as needed for anxiety.    . metoCLOPramide (REGLAN) 10 MG tablet Take 1 tablet (10 mg total) by mouth 3 (three) times daily before meals. 90 tablet 1  . omeprazole (PRILOSEC) 40 MG capsule Take 1 capsule (40 mg total) by mouth daily. 30 capsule 1  . promethazine (PHENERGAN) 12.5 MG tablet Take 1 tablet (12.5 mg total) by mouth every 6 (six) hours as needed for nausea or vomiting. 30 tablet 0   No current facility-administered medications for this visit.   Family History  Problem Relation Age of Onset  . Diabetes Father    Social History   Social History  . Marital Status: Single    Spouse Name: N/A  . Number of Children: 2  . Years of Education: 11   Occupational History  . other     unemployed   Social History Main Topics  . Smoking status: Former Smoker    Types: Cigarettes    Quit date: 04/24/1996  . Smokeless tobacco: Never Used     Comment: quit smoking cigarettes age 71  . Alcohol Use: 0.0 oz/week    0 Standard drinks or equivalent per week     Comment: 04/22/2014 "might have a few drinks a month"  . Drug Use: No     Comment: 04/22/2104 "quit drugs ~ 1-2 yr ago"  . Sexual Activity:  Yes   Other Topics Concern  . 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   Also works at [employer] having to lift elderly individuals.         Review of Systems  Constitutional: Negative for fever and chills.  Gastrointestinal: Positive for nausea, vomiting and abdominal pain. Negative for diarrhea, constipation, blood in stool and melena.  Genitourinary: Negative for dysuria, urgency and frequency.  Musculoskeletal: Negative for myalgias.  Skin: Negative for rash.  Neurological:  Positive for dizziness. Negative for headaches.   Objective:  Physical Exam: Filed Vitals:   04/01/15 1414  BP: 153/129  Pulse: 141  Temp: 98.4 F (36.9 C)  Resp: 20  SpO2: 100%    General: laying face down on the exam bed vomiting into a trash can HEENT: no scleral icterus, extra-ocular muscles intact, oropharynx without lesions Cardiac: tachycardic but regular rhythm, no rubs, murmurs or gallops Pulm: breathing well, clear to auscultation bilaterally Abd: bowel sounds normal, soft, nondistended, slightly tender in the left lower quadrant Ext: warm and well perfused, without pedal edema Lymph: no cervical or supraclavicular lymphadenopathy Skin: no rash, hair, or nail changes Neuro: alert and oriented X3, cranial nerves II-XII grossly intact, moving all extremities well  Assessment & Plan:   Please see problem-based charting for my assessment and plan.

## 2015-04-01 NOTE — H&P (Signed)
Date: 04/01/2015               Patient Name:  Shirley [name] MRN: [id number]  DOB: 04-08-1976 Age / Sex: 39 y.o., female   PCP: Collier Salina, MD         Medical Service: Internal Medicine Teaching Service         Attending Physician: Dr. Sid Falcon, MD    First Contact: Dr. Charlynn Grimes Pager: [phone]  Second Contact: Dr. Randell Patient Pager: [phone]       After Hours (After 5p/  First Contact Pager: [phone]  weekends / holidays): Second Contact Pager: [phone]   Chief Complaint: Nausea/Vomting  History of Present Illness: Ms. Hurlock is a 39 year old female with a history of IDDM type 2 with gastroparesis, depression and HTN presenting today with uncontrolled nausea and vomiting. She was recently on our service with similar complaints. She reported at that time she had not taken any of her medications for 3-4 months because she was unable to afford them. Her nausea and vomiting was well controlled during her hospitalization on Reglan. She was discharged on 12/4 with a [facility] letter to be able to get her medications for $3. She reported prior to discharge that her mother would cover the cost for her and she could get her medications. Unfortunately, her mother did not have the money and she was unable to obtain any of her mediations. She presented to the ED 2 days ago with the same complaints. CT abdomen was done at that time and was unremarkable. She reportedly improved with Haldol and Dilaudid.   She presented to [facility] today with intractable vomiting. She was found to be tachycardic, orthostatic and very dry on their exam and were asked to admit for IVF and intractable emesis. She denies any fevers, chills, dysuria, diarrhea dizziness or lightheadedness. She does report constant LLQ pain that she describes as sharp pain. Also notes occasional streaked blood in her vomit.   Meds: Current Facility-Administered Medications  Medication Dose Route Frequency Provider Last Rate Last Dose  .  0.9 %  sodium chloride infusion   Intravenous Continuous Corky Sox, MD      . enoxaparin (LOVENOX) injection 40 mg  40 mg Subcutaneous Q24H Loleta Chance, MD      . insulin aspart (novoLOG) injection 0-9 Units  0-9 Units Subcutaneous 6 times per day Corky Sox, MD   2 Units at 04/01/15 1824  . insulin glargine (LANTUS) injection 15 Units  15 Units Subcutaneous QHS Maryellen Pile, MD      . labetalol (NORMODYNE,TRANDATE) injection 10 mg  10 mg Intravenous Q4H PRN Corky Sox, MD   10 mg at 04/01/15 1944  . metoCLOPramide (REGLAN) injection 10 mg  10 mg Intravenous 3 times per day Corky Sox, MD   10 mg at 04/01/15 1823  . promethazine (PHENERGAN) injection 12.5-25 mg  12.5-25 mg Intravenous Q6H PRN Corky Sox, MD       Facility-Administered Medications Ordered in Other Encounters  Medication Dose Route Frequency Provider Last Rate Last Dose  . promethazine (PHENERGAN) injection 25 mg  25 mg Intramuscular Once Rushil Sherrye Payor, MD        Allergies: Allergies as of 04/01/2015 - Review Complete 04/01/2015  Allergen Reaction Noted  . Eggs or egg-derived products Hives 03/27/2015  . Lisinopril Nausea Only and Swelling 03/17/2008  . Morphine and related Itching and Swelling 04/15/2013   Past Medical History  Diagnosis Date  .  Thrombocytosis ([place])     Hem/Onc suggested 2/2 chronic hepatits and/or iron deficiency anemia  . Iron deficiency anemia   . N&V (nausea and vomiting)     Chronic. Unclear etiology with multiple admission and ED visits. CT abdomen with and without contrast (02/2011)  showed no acute process. Gastic Emptying scan (01/2010) was normal. Ultrasound of the abdomen was within normal limits. Hepatitis B viral load was undectable. HIV NR. EGD - gastritis, Hpylori + s/p Rx  . Hypertension   . Hyperlipidemia   . Diabetes mellitus type 2, uncontrolled, with complications ([place])   . Recurrent boils   . CAD (coronary artery disease) 06/15/2006    s/p Subendocardial MI with PDA  angioplasty(no stent) on 06/15/06 and relook  cath 06/19/06 showed patency of site. Cath 12/10- no restenosis or significant CAD progression  . Irregular menses     Small ovarian follicles seen on [id number])  . History of pyelonephritis     H/o GrpB Pyelonephritis (9/06) and UTI- 07/11- E.Coli, 12/10- GBS  . Abscess of tunica vaginalis     10/09- Abundant S. aureus- sensitive to all abx  . Obesity   . Gastritis   . Depression   . Peripheral neuropathy ([place])   . Fibromyalgia   . GERD (gastroesophageal reflux disease)   . Anxiety   . Gastroparesis     secondary to poorly controlled DM, last emptying study performed 01/2010  was normal but may be falsely positive as pt was on reglan  . Blood dyscrasia   . Substance abuse   . MI (myocardial infarction) ([place]) 05/2006    PDA percutaneous transluminal coronary angioplasty  . OSA (obstructive sleep apnea)     "suppose to wear mask but I don't" (04/22/2014)  . Seasonal asthma   . Hepatitis B, chronic (HCC)     Hep BeAb+,Hep B cAb+ & Hep BsAg+ (9/06)  . Migraine     "weekly" (04/22/2014)  . CVA (cerebral vascular accident) ([place]) ~ 02/2014    denies residual on 04/22/2014  . CVA (cerebral vascular accident) [facility])     history of remote right cerebellar infarct noted on head CT at least since 10/2011  . Pneumonia     "this is probably the 2nd or 3rd time I've had pneumonia" (04/22/2014)   Past Surgical History  Procedure Laterality Date  . Cesarean section  1997  . Coronary angioplasty with stent placement  2008    "2 stents"  . Esophagogastroduodenoscopy N/A 04/23/2014    Procedure: ESOPHAGOGASTRODUODENOSCOPY (EGD);  Surgeon: Winfield Cunas., MD;  Location[facility] ENDOSCOPY;  Service: Endoscopy;  Laterality: N/A;   Family History  Problem Relation Age of Onset  . Diabetes Father    Social History   Social History  . Marital Status: Single    Spouse Name: N/A  . Number of Children: 2  . Years of Education: 11   Occupational  History  . other     unemployed   Social History Main Topics  . Smoking status: Former Smoker    Types: Cigarettes    Quit date: 04/24/1996  . Smokeless tobacco: Never Used     Comment: quit smoking cigarettes age 73  . Alcohol Use: 0.0 oz/week    0 Standard drinks or equivalent per week     Comment: 04/22/2014 "might have a few drinks a month"  . Drug Use: No     Comment: 04/22/2104 "quit drugs ~ 1-2 yr ago"  . Sexual Activity: Yes   Other  Topics Concern  . 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   Also works at [employer] having to lift elderly individuals.          Review of Systems: Review of Systems  Constitutional: Negative for fever, chills and diaphoresis.  Eyes: Negative for blurred vision and double vision.  Respiratory: Negative for cough and shortness of breath.   Cardiovascular: Negative for chest pain.  Gastrointestinal: Positive for nausea, vomiting and abdominal pain. Negative for diarrhea, constipation, blood in stool and melena.  Genitourinary: Negative for dysuria.  Musculoskeletal: Negative for myalgias and back pain.  Skin: Negative for rash.  Neurological: Negative for dizziness and headaches.   Physical Exam: Blood pressure 207/114, pulse 114, temperature 98.6 F (37 C), resp. rate 19, height 5\' 7"  (1.702 m), weight 198 lb (89.812 kg), SpO2 100 %. General: laying face down on the exam bed vomiting into a trash can HEENT: no scleral icterus, extra-ocular muscles intact, oropharynx without lesions Cardiac: tachycardic but regular rhythm, no rubs, murmurs or gallops Pulm: breathing well, clear to auscultation bilaterally Abd: bowel sounds normal, soft, nondistended, mild tenderness in the left lower quadrant Ext: warm and well perfused, without pedal edema Lymph: no cervical or supraclavicular lymphadenopathy Skin: no rash, hair, or nail changes Neuro: alert  and oriented X3, cranial nerves II-XII grossly intact, moving all extremities well  Lab results: Basic Metabolic Panel:  Recent Labs  03/30/15 1230 04/01/15 1807  NA 136 137  K 3.9 3.7  CL 99* 102  CO2 24 25  GLUCOSE 253* 200*  BUN 7 9  CREATININE 0.82 0.91  CALCIUM 10.0 9.5   Liver Function Tests:  Recent Labs  03/30/15 1230 04/01/15 1807  AST 20 16  ALT 9* 9*  ALKPHOS 67 54  BILITOT 0.4 0.5  PROT 9.1* 7.4  ALBUMIN 3.3* 3.0*    Recent Labs  03/30/15 1230  LIPASE 33   No results for input(s): AMMONIA in the last 72 hours. CBC:  Recent Labs  03/30/15 1230 04/01/15 1807  WBC 15.1* 10.7*  HGB 11.4* 9.7*  HCT 34.1* 29.5*  MCV 69.7* 69.4*  PLT 675* 528*   Cardiac Enzymes: No results for input(s): CKTOTAL, CKMB, CKMBINDEX, TROPONINI in the last 72 hours. BNP: No results for input(s): PROBNP in the last 72 hours. D-Dimer: No results for input(s): DDIMER in the last 72 hours. CBG:  Recent Labs  04/01/15 1420 04/01/15 1809  GLUCAP 240* 180*   Hemoglobin A1C: No results for input(s): HGBA1C in the last 72 hours. Fasting Lipid Panel: No results for input(s): CHOL, HDL, LDLCALC, TRIG, CHOLHDL, LDLDIRECT in the last 72 hours. Thyroid Function Tests: No results for input(s): TSH, T4TOTAL, FREET4, T3FREE, THYROIDAB in the last 72 hours. Anemia Panel: No results for input(s): VITAMINB12, FOLATE, FERRITIN, TIBC, IRON, RETICCTPCT in the last 72 hours. Coagulation: No results for input(s): LABPROT, INR in the last 72 hours. Urine Drug Screen: Drugs of Abuse     Component Value Date/Time   LABOPIA POSITIVE* 11/11/2014 1658   LABOPIA NEGATIVE 02/21/2010 2201   COCAINSCRNUR POSITIVE* 11/11/2014 1658   COCAINSCRNUR NEGATIVE 02/21/2010 2201   LABBENZ NONE DETECTED 11/11/2014 1658   LABBENZ NEGATIVE 02/21/2010 2201   AMPHETMU NONE DETECTED 11/11/2014 1658   AMPHETMU NEGATIVE 02/21/2010 2201   THCU NONE DETECTED 11/11/2014 1658   LABBARB NONE DETECTED  11/11/2014 1658    Alcohol Level:  No results for input(s): ETH in the last 72 hours. Urinalysis:  Recent Labs  03/30/15 1822  COLORURINE RED*  LABSPEC 1.015  PHURINE 8.0  GLUCOSEU >1000*  HGBUR LARGE*  BILIRUBINUR NEGATIVE  KETONESUR 40*  PROTEINUR 100*  NITRITE NEGATIVE  LEUKOCYTESUR SMALL*    Imaging results:  No results found.   Assessment & Plan by Problem: Ms. Kaman is a 39 year old female with a history of IDDM 2 and gastroparesis presenting with intractable vomiting mostly likely 2/2 ongoing gastroparesis and unable to afford her medications.   Intractable Vomiting: Her symptoms are all consistent with gastroparesis and likely all related to her inability to afford any of her medications. She has never been able to have a gastric emptying study done as she reports alleriges to both oatmeal and eggs though EGD done 03/2014 was suggestive of gastroparesis as there was residual food in her esophagus. She has had good response to Reglan in the past but has been unable to obtain in outpatient 2/2 financial difficulties. She is tachycardic, orthostatic with dry mucus membranes. BMP obtained is unremarkable with normal renal function. Will restart her home medications and rehydrate.  -1 L NS bolus, then NS 150 mL/hr -NPO -phenergan PRN -Reglan 10 mg tid -protonix 40 mg daily -reconsider gastric emptying study  HTN: Patient who has not taken her medicines for the past several months as an outpatient with BP 207/114.  - labetalol 10 mg IV for SBP>190 DBP >110  - Will attempt to resume oral BP medications once emesis is controlled  T2DM: A1c on previous admission was 11.7. She has been unable to afford any medications as an outpatient.  -SSI-s -Lantus 15 units qhs  Orthostatic Hypotension:  Likely some autonomic dysfunction from longstanding uncontrolled DM that was exacerbated on admission 2/2 dehydration. -IVF  Microcytic anemia: Patient with chronic anemia. Denies  hematochezia or melena, but does endorse mild bloody emesis. Most likely her microcytic anemia is from the uterine fibroids causing her heavy menstrual periods. Previous iron 12, TIBC 330 and ferritin 15 -Restart Fe 325 mg daily when able to tolerate PO -needs obgyn referral   DVT PPx: Lovenox  Dispo: Disposition is deferred at this time, awaiting improvement of current medical problems. Anticipated discharge in approximately 1-2 day(s).   The patient does have a current PCP Collier Salina, MD) and does need an [facility] hospital follow-up appointment after discharge.  The patient does not have transportation limitations that hinder transportation to clinic appointments.  Signed: Maryellen Pile, MD 04/01/2015, 7:46 PM

## 2015-04-01 NOTE — Progress Notes (Signed)
Pt arrived to unit via wheelchair, accompanied by 2 nurses. Pt was shaky when transferring from wheelchair to hospital bed. Pt stated that she is nauseous. Trash can place on the side of bed. RN informed nurse tech to get pink basin for pt. RN attempted to do physical head to toe assessment pt refused, told RN "not right not. " RN will try again in a [place] while.  Pt laying in prone position in bed. Bed low and locked. Bed alarm on. Call bell within reach.

## 2015-04-01 NOTE — Assessment & Plan Note (Addendum)
Shirley [name] presents with nausea and vomiting that is identical to her most recent admission 8 days ago. Unfortunately she was unable to get her medicines since discharge. She hasn't been able to keep anything down and the last 2 days, appears very dry on exam, and is tachycardic to 140 and orthostatic with a jump in her pulse from 120 lying down to 150 upon standing.  It's a tragedy that she wasn't able to get her medicines, because I feel this admission could have prevented otherwise. However, I feel she is so dehydrated that she should be admitted to get IV fluids until her nausea can be controlled with oral medications. Inpatient endoscopy is another consideration to evaluate for gastric outlet obstruction. The biggest problem is getting her medications to her. I'm not sure if the $3 cost was true or not, but we'll need to get social work involved early to figure out how to send her home with all of her medications.  Regarding the etiology of her cyclic vomiting: Gastroparesis has been the original thought, but she cannot get an gastric emptying study as she is allergic to both eggs and oatmeal. Gastric outlet obstruction is another consideration, as I don't see an endoscopy on file. She last used marijuana 2 months ago, but marijuana-induced cyclic vomiting is another thought.

## 2015-04-02 DIAGNOSIS — G43A0 Cyclical vomiting, not intractable: Secondary | ICD-10-CM

## 2015-04-02 DIAGNOSIS — R1115 Cyclical vomiting syndrome unrelated to migraine: Secondary | ICD-10-CM | POA: Insufficient documentation

## 2015-04-02 LAB — BASIC METABOLIC PANEL
Anion gap: 7 (ref 5–15)
BUN: 10 mg/dL (ref 6–20)
CO2: 25 mmol/L (ref 22–32)
CREATININE: 0.9 mg/dL (ref 0.44–1.00)
Calcium: 8.4 mg/dL — ABNORMAL LOW (ref 8.9–10.3)
Chloride: 107 mmol/L (ref 101–111)
GFR calc Af Amer: >60 mL/min (ref >60)
GLUCOSE: 97 mg/dL (ref 65–99)
POTASSIUM: 3.7 mmol/L (ref 3.5–5.1)
Sodium: 139 mmol/L (ref 135–145)

## 2015-04-02 LAB — CBC WITH DIFFERENTIAL/PLATELET
Basophils Absolute: 0.1 K/uL (ref 0.0–0.1)
Basophils Relative: 1 %
Eosinophils Absolute: 0.3 K/uL (ref 0.0–0.7)
Eosinophils Relative: 3 %
HCT: 25.1 % — ABNORMAL LOW (ref 36.0–46.0)
Hemoglobin: 8.5 g/dL — ABNORMAL LOW (ref 12.0–15.0)
Lymphocytes Relative: 33 %
Lymphs Abs: 3.5 K/uL (ref 0.7–4.0)
MCH: 23.5 pg — ABNORMAL LOW (ref 26.0–34.0)
MCHC: 33.9 g/dL (ref 30.0–36.0)
MCV: 69.3 fL — ABNORMAL LOW (ref 78.0–100.0)
Monocytes Absolute: 0.9 K/uL (ref 0.1–1.0)
Monocytes Relative: 9 %
Neutro Abs: 5.7 K/uL (ref 1.7–7.7)
Neutrophils Relative %: 54 %
Platelets: 553 K/uL — ABNORMAL HIGH (ref 150–400)
RBC: 3.62 MIL/uL — ABNORMAL LOW (ref 3.87–5.11)
RDW: 17.1 % — ABNORMAL HIGH (ref 11.5–15.5)
WBC: 10.5 K/uL (ref 4.0–10.5)

## 2015-04-02 LAB — GLUCOSE, CAPILLARY
Glucose-Capillary: 93 mg/dL (ref 65–99)
Glucose-Capillary: 91 mg/dL (ref 65–99)
Glucose-Capillary: 97 mg/dL (ref 65–99)
Glucose-Capillary: 116 mg/dL — ABNORMAL HIGH (ref 65–99)

## 2015-04-02 MED ORDER — METOCLOPRAMIDE HCL 10 MG PO TABS: 10.0000 mg | ORAL_TABLET | Freq: Three times a day (TID) | ORAL | Status: DC

## 2015-04-02 MED ORDER — LABETALOL HCL 100 MG PO TABS: 100.0000 mg | ORAL_TABLET | Freq: Two times a day (BID) | ORAL | Status: DC

## 2015-04-02 MED ORDER — OMEPRAZOLE 40 MG PO CPDR: 40.0000 mg | DELAYED_RELEASE_CAPSULE | Freq: Every day | ORAL | Status: DC

## 2015-04-02 MED ORDER — PROMETHAZINE HCL 12.5 MG PO TABS: 12.5000 mg | ORAL_TABLET | Freq: Four times a day (QID) | ORAL | Status: DC | PRN

## 2015-04-02 MED ORDER — FERROUS SULFATE 325 (65 FE) MG PO TABS: 325.0000 mg | ORAL_TABLET | Freq: Every day | ORAL | Status: DC

## 2015-04-02 MED ORDER — INSULIN PEN NEEDLE 31G X 5 MM MISC: Status: DC

## 2015-04-02 MED ORDER — INSULIN GLARGINE 100 UNIT/ML SOLOSTAR PEN: 20.0000 [IU] | PEN_INJECTOR | Freq: Every day | SUBCUTANEOUS | Status: DC

## 2015-04-02 MED ORDER — ATORVASTATIN CALCIUM 80 MG PO TABS: 80.0000 mg | ORAL_TABLET | Freq: Every day | ORAL | Status: DC

## 2015-04-02 NOTE — Progress Notes (Signed)
   Subjective: Ms. Regis is doing well this morning. She reports no further nausea of emesis. Still has mild LLQ pain but improved from previous.   Objective: Vital signs in last 24 hours: Filed Vitals:   04/01/15 1713 04/01/15 1719 04/01/15 2030 04/02/15 0523  BP:  207/114 128/77 134/85  Pulse:  114 94 84  Temp:  98.6 F (37 C) 99.3 F (37.4 C) 98.3 F (36.8 C)  TempSrc:   Oral Oral  Resp:  19 16 13   Height: 5\' 7"  (1.702 m)     Weight: 198 lb (89.812 kg)     SpO2:  100% 100% 100%   Weight change:   Intake/Output Summary (Last 24 hours) at 04/02/15 1343 Last data filed at 04/02/15 0900  Gross per 24 hour  Intake    995 ml  Output      0 ml  Net    995 ml    PHYSICAL EXAM GENERAL- alert, co-operative, resting comfortably in bed, not in any distress. HEENT- Atraumatic, normocephalic, oral mucosa appears moist CARDIAC- RRR, no murmurs, rubs or gallops. RESP- Moving equal volumes of air, and clear to auscultation bilaterally, no wheezes or crackles. ABDOMEN- Soft, mild LLQ tenderness, bowel sounds present. NEURO- No obvious Cr N abnormality. EXTREMITIES- pulse 2+, symmetric, no pedal edema. SKIN- Warm, dry, No rash or lesion. PSYCH- Normal mood and affect, appropriate thought content and speech.  Medications: I have reviewed the patient's current medications. Scheduled Meds: . enoxaparin (LOVENOX) injection  40 mg Subcutaneous Q24H  . insulin aspart  0-9 Units Subcutaneous 6 times per day  . insulin glargine  15 Units Subcutaneous QHS  . metoCLOPramide (REGLAN) injection  10 mg Intravenous 3 times per day  . pantoprazole (PROTONIX) IV  40 mg Intravenous Q24H   Continuous Infusions: . sodium chloride 150 mL/hr at 04/01/15 2322   PRN Meds:.labetalol, promethazine Assessment/Plan:  Intractable Vomiting: Ms. Lenzo's symptoms have completely resolved after receiving IVF, Reglan and Phenergan overnight. This is likely all related to her gastroparesis and her medication  non-compliance. Will work with social work today and attempt to obtain medications for her as an outpatient.  -Advance diet as tolerated -NS 150 mL/hr -phenergan PRN -Reglan 10 mg tid -protonix 40 mg daily  HTN: BP 134/85 this morning.  - restart home dose labetalol   T2DM: A1c on previous admission was 11.7. She has been unable to afford any medications as an outpatient.  -SSI-s -Lantus 15 units qhs  Orthostatic Hypotension: Likely some autonomic dysfunction from longstanding uncontrolled DM that was exacerbated on admission 2/2 dehydration. -Continue IVF  Microcytic anemia: Patient with chronic anemia. Denies hematochezia or melena, but does endorse mild bloody emesis. Most likely her microcytic anemia is from the uterine fibroids causing her heavy menstrual periods. Previous iron 12, TIBC 330 and ferritin 15 -Restart Fe 325 mg daily -needs obgyn referral outpatient   DVT PPx: Lovenox  Dispo: Discharge today   The patient does have a current PCP Collier Salina, MD) and does need an [facility] hospital follow-up appointment after discharge.  The patient does not have transportation limitations that hinder transportation to clinic appointments.  .Services Needed at time of discharge: Y = Yes, Blank = No PT:   OT:   RN:   Equipment:   Other:       Maryellen Pile, MD IMTS PGY-1 [phone] 04/02/2015, 1:43 PM

## 2015-04-02 NOTE — Discharge Instructions (Signed)
Shirley [name] it is important that you take your medications to prevent these symptoms from returning. We gave you the [facility] letter at your last hospitalization to cover your medications for $3. That letter is only good for 1 week so it is important that you go to have them filled today. After you are discharged please go downstairs to the clinic to meet with [facility]. We have a fund for 1 time medication fund to help you get your medications. She can also help you get the [place] to get your medications for free. You need to bring the following to your next clinic visit: -Proof of household income (3 months of bank statements if you bank) -If someone else provides for you, you need a Notarized letter showing who provides food and shelter for you -A Valid ID  You have a follow up appointment in clinic on Monday 12/12 at 2:15 with Dr. Benjamine Mola. Please remember to bring all of these documents and your medications to that visit.

## 2015-04-02 NOTE — Care Management Note (Signed)
Case Management Note  Patient Details  Name: Shirley [name] MRN: [id number] Date of Birth: December 16, 1975  Subjective/Objective:     Date: 04/02/15 Spoke with patient at the bedside.  Introduced self as Tourist information centre manager and explained role in discharge planning and how to be reached.  Verified patient lives in town, alone with boyfriend.  Expressed potential need for no other DME.  Verified patient anticipates to go home with boyfriend at time of discharge and will have full-time supervision by friend at this time to best of their knowledge.  Patient confirmed needing help with their medication, her match letter expires on 04/03/15, NCM extended dates for patient, she states she is for dc today.  Patient is driven by family or friends to MD appointments.  Verified patient has PCP at internal medicine clinic.   Plan: CM will continue to follow for discharge planning and [facility] resources.                Action/Plan:   Expected Discharge Date:                  Expected Discharge Plan:  Home/Self Care  In-House Referral:     Discharge planning Services  CM Consult, [place] [facility]  Post Acute Care Choice:    Choice offered to:     DME Arranged:    DME Agency:     HH Arranged:    HH Agency:     Status of Service:  Completed, signed off  Medicare Important Message Given:    Date Medicare IM Given:    Medicare IM give by:    Date Additional Medicare IM Given:    Additional Medicare Important Message give by:     If discussed at [place] of Stay Meetings, dates discussed:    Additional Comments:  Zenon Mayo, RN 04/02/2015, 12:42 PM

## 2015-04-02 NOTE — Progress Notes (Signed)
Nsg Discharge Note  Admit Date:  04/01/2015 Discharge date: 04/02/2015   Berkli N Ryant to be D/C'd Home per MD order.  AVS completed.  Copy for chart, and copy for patient signed, and dated. Patient/caregiver able to verbalize understanding.  Discharge Medication:   Medication List    STOP taking these medications        ADVAIR DISKUS 100-50 MCG/DOSE Aepb  Generic drug:  Fluticasone-Salmeterol     gabapentin 300 MG capsule  Commonly known as:  NEURONTIN     LORazepam 1 MG tablet  Commonly known as:  ATIVAN      TAKE these medications        albuterol 108 (90 BASE) MCG/ACT inhaler  Commonly known as:  PROVENTIL HFA;VENTOLIN HFA  Inhale 2 puffs into the lungs every 4 (four) hours as needed for wheezing or shortness of breath.     aspirin 81 MG chewable tablet  Chew 81 mg by mouth daily at 12 noon.     atorvastatin 80 MG tablet  Commonly known as:  LIPITOR  Take 1 tablet (80 mg total) by mouth daily at 6 PM.     ferrous sulfate 325 (65 FE) MG tablet  Take 1 tablet (325 mg total) by mouth daily with breakfast.     Insulin Glargine 100 UNIT/ML Solostar Pen  Commonly known as:  LANTUS SOLOSTAR  Inject 20 Units into the skin daily at 10 pm.     Insulin Pen Needle 31G X 5 MM Misc  Inject 20 units of Lantus at night before bed     labetalol 100 MG tablet  Commonly known as:  NORMODYNE  Take 1 tablet (100 mg total) by mouth 2 (two) times daily.     metoCLOPramide 10 MG tablet  Commonly known as:  REGLAN  Take 1 tablet (10 mg total) by mouth 3 (three) times daily before meals.     omeprazole 40 MG capsule  Commonly known as:  PRILOSEC  Take 1 capsule (40 mg total) by mouth daily.     promethazine 12.5 MG tablet  Commonly known as:  PHENERGAN  Take 1 tablet (12.5 mg total) by mouth every 6 (six) hours as needed for nausea or vomiting.        Discharge Assessment: Filed Vitals:   04/02/15 0523 04/02/15 1412  BP: 134/85 162/115  Pulse: 84 125  Temp: 98.3 F  (36.8 C) 98.3 F (36.8 C)  Resp: 13 20   Skin clean, dry and intact without evidence of skin break down, no evidence of skin tears noted. IV catheter discontinued intact. Site without signs and symptoms of complications - no redness or edema noted at insertion site, patient denies c/o pain - only slight tenderness at site.  Dressing with slight pressure applied.  D/c Instructions-Education: Discharge instructions given to patient/family with verbalized understanding. D/c education completed with patient/family including follow up instructions, medication list, d/c activities limitations if indicated, with other d/c instructions as indicated by MD - patient able to verbalize understanding, all questions fully answered. Patient instructed to return to ED, call 911, or call MD for any changes in condition.  Patient escorted via [place], and D/C home via private auto.  Salley Slaughter, RN 04/02/2015 2:58 PM

## 2015-04-02 NOTE — Discharge Summary (Signed)
Name: Shirley [name] MRN: [id number] DOB: 03-01-1976 39 y.o. PCP: Collier Salina, MD  Date of Admission: 04/01/2015  5:01 PM Date of Discharge: 04/04/2015 Attending Physician: No att. providers found  Discharge Diagnosis: Active Problems:   Intractable vomiting   Non-intractable cyclical vomiting with nausea  Discharge Medications:   Medication List    STOP taking these medications        ADVAIR DISKUS 100-50 MCG/DOSE Aepb  Generic drug:  Fluticasone-Salmeterol     gabapentin 300 MG capsule  Commonly known as:  NEURONTIN     LORazepam 1 MG tablet  Commonly known as:  ATIVAN      TAKE these medications        albuterol 108 (90 BASE) MCG/ACT inhaler  Commonly known as:  PROVENTIL HFA;VENTOLIN HFA  Inhale 2 puffs into the lungs every 4 (four) hours as needed for wheezing or shortness of breath.     aspirin 81 MG chewable tablet  Chew 81 mg by mouth daily at 12 noon.     atorvastatin 80 MG tablet  Commonly known as:  LIPITOR  Take 1 tablet (80 mg total) by mouth daily at 6 PM.     ferrous sulfate 325 (65 FE) MG tablet  Take 1 tablet (325 mg total) by mouth daily with breakfast.     Insulin Glargine 100 UNIT/ML Solostar Pen  Commonly known as:  LANTUS SOLOSTAR  Inject 20 Units into the skin daily at 10 pm.     Insulin Pen Needle 31G X 5 MM Misc  Inject 20 units of Lantus at night before bed     labetalol 100 MG tablet  Commonly known as:  NORMODYNE  Take 1 tablet (100 mg total) by mouth 2 (two) times daily.     metoCLOPramide 10 MG tablet  Commonly known as:  REGLAN  Take 1 tablet (10 mg total) by mouth 3 (three) times daily before meals.     omeprazole 40 MG capsule  Commonly known as:  PRILOSEC  Take 1 capsule (40 mg total) by mouth daily.     promethazine 12.5 MG tablet  Commonly known as:  PHENERGAN  Take 1 tablet (12.5 mg total) by mouth every 6 (six) hours as needed for nausea or vomiting.        Disposition and follow-up:   Shirley [name] was discharged from [facility] in Good condition.  At the hospital follow up visit please address:  1. Gastroparesis: Symptoms resolved after restarting her home medications. Please ensure she has filled her Rx and is taking medications. She is suppose to bring materials to [employer] to work on getting an orange card. DM: Started back on Lantus 20 units qhs. Does not have a meter. Please see if Butch Penny can provide a meter for her and testing strips/lancets. Previously was suppose to be on Lantus 24 units qhs and Novolog 2 tid per 8/30 visit notes. Reports being unable to tolerate Metformin in the past but may be work revisiting (reported N/V) if gastroparesis symptoms are well controlled on Reglan. LLQ pain: Hx of fibroids. Transvaginal US done 04/2013 with no ovarian cysts noted. CT abdomen 03/30/2015 with no acute findings. Does have hx of chronic anemia and menorrhagia. Consider OB/gyn referral.  2.  Labs / imaging needed at time of follow-up: None  3.  Pending labs/ test needing follow-up: None  Follow-up Appointments: Follow-up Information    Follow up with Hinton Lovely, MD On 04/05/2015.  Specialty:  Internal Medicine   Why:  2:15 PM   Contact information:   [place] [id number] [phone]       Discharge Instructions: Discharge Instructions    Diet - low sodium heart healthy    Complete by:  As directed      Increase activity slowly    Complete by:  As directed            Consultations:    Procedures Performed:  Ct Abdomen Pelvis W Contrast  03/30/2015  CLINICAL DATA:  Left lower quadrant pain for 1 week. Nausea and vomiting. EXAM: CT ABDOMEN AND PELVIS WITH CONTRAST TECHNIQUE: Multidetector CT imaging of the abdomen and pelvis was performed using the standard protocol following bolus administration of intravenous contrast. CONTRAST:  133m OMNIPAQUE IOHEXOL 300 MG/ML  SOLN COMPARISON:  12/31/2013 FINDINGS: Lower chest:  No  acute findings. Hepatobiliary: No masses or other significant abnormality. Gallbladder is unremarkable. Pancreas: No mass, inflammatory changes, or other significant abnormality. Spleen: Within normal limits in size and appearance. Adrenals/Urinary Tract: No masses identified. No evidence of hydronephrosis. Stomach/Bowel: No evidence of obstruction, inflammatory process, or abnormal fluid collections. Normal appendix visualized. Vascular/Lymphatic: No pathologically enlarged lymph nodes. No evidence of abdominal aortic aneurysm. Reproductive: Stable prominence of myometrium in the posterior corpus which may represent a fibroid or adenomyosis. Other: None. Musculoskeletal:  No suspicious bone lesions identified. IMPRESSION: No acute findings within the abdomen or pelvis. Question posterior uterine fibroid versus adenomyosis. Consider pelvic ultrasound for further evaluation if clinically warranted. Electronically Signed   By: JEarle GellM.D.   On: 03/30/2015 19:45   Admission HPI: Shirley [name] a 39year old female with a history of IDDM type 2 with gastroparesis, depression and HTN presenting today with uncontrolled nausea and vomiting. She was recently on our service with similar complaints. She reported at that time she had not taken any of her medications for 3-4 months because she was unable to afford them. Her nausea and vomiting was well controlled during her hospitalization on Reglan. She was discharged on 12/4 with a [place] to be able to get her medications for $3. She reported prior to discharge that her mother would cover the cost for her and she could get her medications. Unfortunately, her mother did not have the money and she was unable to obtain any of her mediations. She presented to the ED 2 days ago with the same complaints. CT abdomen was done at that time and was unremarkable. She reportedly improved with Haldol and Dilaudid.   [facility]. She was found to be tachycardic, orthostatic and very dry on their exam and were asked to admit for IVF and intractable emesis. She denies any fevers, chills, dysuria, diarrhea dizziness or lightheadedness. She does report constant LLQ pain that she describes as sharp pain. Also notes occasional streaked blood in her vomit.   Hospital Course by problem list: Active Problems:   Intractable vomiting   Non-intractable cyclical vomiting with nausea   Intractable Vomiting: Her symptoms are all consistent with gastroparesis and likely all related to her inability to afford any of her medications. She has never been able to have a gastric emptying study done as she reports alleriges to both oatmeal and eggs though EGD done 03/2014 was suggestive of gastroparesis as there was residual food in her esophagus. She was tachycardic, orthostatic with dry mucus membranes on admission. BMP was unremarkable with normal  renal function. She was restarted on her home medications and IVF with complete resolution of her symptoms by the following morning. Arranged for her to have emergency $25 dollar (one time) fund given to her by our clinic with instructions to use her [place] letter on day of discharge before it expired to receive her medications for $3 per Rx. Also met with Bess Kinds in our clinic to get the funds with information to bring to her clinic visit to obtain an orange card.   HTN: Patient who has not taken her medicines for the past several months as an outpatient with BP 207/114. Improved overnight to 134/85. Resumed labetalol 100 mg daily at discharge.    T2DM: A1c on previous admission was 11.7. She has been unable to afford any medications as an outpatient.  Started on SSI-s and Lantus 15 units qhs. Discharge with Rx for Lantus 20 mg qhs.   Orthostatic Hypotension: Likely some autonomic dysfunction from longstanding uncontrolled DM that was exacerbated on admission 2/2  dehydration.  Microcytic anemia: Patient with chronic anemia. Denied hematochezia or melena, but didendorse mild bloody emesis. Most likely her microcytic anemia is from the uterine fibroids causing her heavy menstrual periods. Previous iron 12, TIBC 330 and ferritin 15. Restarted Fe 325 mg daily at discharge. 04/2013 Transvaginal US with Normal sonographic appearance to the ovaries. No fluid collection or appreciable hydrosalpinx.. Will likely need OB/Gyn referral as an outpatient.   Discharge Vitals:   BP 162/115 mmHg  Pulse 125  Temp(Src) 98.3 F (36.8 C) (Oral)  Resp 20  Ht 5' 7"  (1.702 m)  Wt 198 lb (89.812 kg)  BMI 31.00 kg/m2  SpO2 100%  Discharge Labs:  No results found for this or any previous visit (from the past 24 hour(s)).  Signed: Maryellen Pile, MD 04/04/2015, 6:11 PM

## 2015-04-05 ENCOUNTER — Encounter: Payer: Self-pay | Admitting: Internal Medicine

## 2015-04-05 ENCOUNTER — Ambulatory Visit: Payer: Medicaid Other | Admitting: Internal Medicine

## 2015-04-12 NOTE — Progress Notes (Signed)
I saw and evaluated the patient.  I personally confirmed the key portions of Dr. Flores' history and exam and reviewed pertinent patient test results.  The assessment, diagnosis, and plan were formulated together and I agree with the documentation in the resident's note. 

## 2015-04-17 ENCOUNTER — Encounter (HOSPITAL_COMMUNITY): Payer: Self-pay | Admitting: Emergency Medicine

## 2015-04-17 ENCOUNTER — Emergency Department (HOSPITAL_COMMUNITY): Payer: Medicaid Other

## 2015-04-17 ENCOUNTER — Emergency Department (HOSPITAL_COMMUNITY)
Admission: EM | Admit: 2015-04-17 | Discharge: 2015-04-17 | Disposition: A | Discharge status: Home / Self Care | Payer: Medicaid Other | Attending: Emergency Medicine | Admitting: Emergency Medicine

## 2015-04-17 DIAGNOSIS — Z8701 Personal history of pneumonia (recurrent): Secondary | ICD-10-CM | POA: Insufficient documentation

## 2015-04-17 DIAGNOSIS — Z8659 Personal history of other mental and behavioral disorders: Secondary | ICD-10-CM | POA: Insufficient documentation

## 2015-04-17 DIAGNOSIS — M25461 Effusion, right knee: Secondary | ICD-10-CM | POA: Insufficient documentation

## 2015-04-17 DIAGNOSIS — Z8673 Personal history of transient ischemic attack (TIA), and cerebral infarction without residual deficits: Secondary | ICD-10-CM | POA: Insufficient documentation

## 2015-04-17 DIAGNOSIS — I10 Essential (primary) hypertension: Secondary | ICD-10-CM | POA: Insufficient documentation

## 2015-04-17 DIAGNOSIS — I252 Old myocardial infarction: Secondary | ICD-10-CM | POA: Insufficient documentation

## 2015-04-17 DIAGNOSIS — Z9861 Coronary angioplasty status: Secondary | ICD-10-CM | POA: Insufficient documentation

## 2015-04-17 DIAGNOSIS — E118 Type 2 diabetes mellitus with unspecified complications: Secondary | ICD-10-CM | POA: Insufficient documentation

## 2015-04-17 DIAGNOSIS — J45909 Unspecified asthma, uncomplicated: Secondary | ICD-10-CM | POA: Insufficient documentation

## 2015-04-17 DIAGNOSIS — Y9389 Activity, other specified: Secondary | ICD-10-CM | POA: Insufficient documentation

## 2015-04-17 DIAGNOSIS — Z87891 Personal history of nicotine dependence: Secondary | ICD-10-CM | POA: Insufficient documentation

## 2015-04-17 DIAGNOSIS — Y998 Other external cause status: Secondary | ICD-10-CM | POA: Insufficient documentation

## 2015-04-17 DIAGNOSIS — Z79899 Other long term (current) drug therapy: Secondary | ICD-10-CM | POA: Insufficient documentation

## 2015-04-17 DIAGNOSIS — X58XXXA Exposure to other specified factors, initial encounter: Secondary | ICD-10-CM | POA: Insufficient documentation

## 2015-04-17 DIAGNOSIS — Z794 Long term (current) use of insulin: Secondary | ICD-10-CM | POA: Insufficient documentation

## 2015-04-17 DIAGNOSIS — I2510 Atherosclerotic heart disease of native coronary artery without angina pectoris: Secondary | ICD-10-CM | POA: Insufficient documentation

## 2015-04-17 DIAGNOSIS — G43909 Migraine, unspecified, not intractable, without status migrainosus: Secondary | ICD-10-CM | POA: Insufficient documentation

## 2015-04-17 DIAGNOSIS — D509 Iron deficiency anemia, unspecified: Secondary | ICD-10-CM | POA: Insufficient documentation

## 2015-04-17 DIAGNOSIS — Z7982 Long term (current) use of aspirin: Secondary | ICD-10-CM | POA: Insufficient documentation

## 2015-04-17 DIAGNOSIS — Y9289 Other specified places as the place of occurrence of the external cause: Secondary | ICD-10-CM | POA: Insufficient documentation

## 2015-04-17 DIAGNOSIS — Z872 Personal history of diseases of the skin and subcutaneous tissue: Secondary | ICD-10-CM | POA: Insufficient documentation

## 2015-04-17 DIAGNOSIS — S8991XA Unspecified injury of right lower leg, initial encounter: Secondary | ICD-10-CM | POA: Insufficient documentation

## 2015-04-17 DIAGNOSIS — M25561 Pain in right knee: Secondary | ICD-10-CM

## 2015-04-17 DIAGNOSIS — K219 Gastro-esophageal reflux disease without esophagitis: Secondary | ICD-10-CM | POA: Insufficient documentation

## 2015-04-17 DIAGNOSIS — Z8619 Personal history of other infectious and parasitic diseases: Secondary | ICD-10-CM | POA: Insufficient documentation

## 2015-04-17 DIAGNOSIS — Z87440 Personal history of urinary (tract) infections: Secondary | ICD-10-CM | POA: Insufficient documentation

## 2015-04-17 DIAGNOSIS — E785 Hyperlipidemia, unspecified: Secondary | ICD-10-CM | POA: Insufficient documentation

## 2015-04-17 DIAGNOSIS — K3184 Gastroparesis: Secondary | ICD-10-CM | POA: Insufficient documentation

## 2015-04-17 DIAGNOSIS — Z8742 Personal history of other diseases of the female genital tract: Secondary | ICD-10-CM | POA: Insufficient documentation

## 2015-04-17 DIAGNOSIS — E669 Obesity, unspecified: Secondary | ICD-10-CM | POA: Insufficient documentation

## 2015-04-17 MED ORDER — OXYCODONE-ACETAMINOPHEN 5-325 MG PO TABS: 2.0000 | ORAL_TABLET | ORAL | Status: DC | PRN

## 2015-04-17 MED ORDER — OXYCODONE-ACETAMINOPHEN 5-325 MG PO TABS
2.0000 | ORAL_TABLET | Freq: Once | ORAL | Status: AC
  Administered 2015-04-17: 2 via ORAL
  Filled 2015-04-17: qty 2

## 2015-04-17 MED ORDER — KETOROLAC TROMETHAMINE 60 MG/2ML IM SOLN
60.0000 mg | Freq: Once | INTRAMUSCULAR | Status: AC
  Administered 2015-04-17: 60 mg via INTRAMUSCULAR
  Filled 2015-04-17: qty 2

## 2015-04-17 MED ORDER — IBUPROFEN 800 MG PO TABS: 800.0000 mg | ORAL_TABLET | Freq: Three times a day (TID) | ORAL | Status: DC

## 2015-04-17 MED ORDER — DICLOFENAC SODIUM 1 % TD GEL: 2.0000 g | Freq: Four times a day (QID) | TRANSDERMAL | Status: DC

## 2015-04-17 NOTE — ED Notes (Signed)
Pt c/o pain and swelling to right knee onset last night, Pt was trying to get into bed and heard a pop.Pt boyfriend pulled on knee and made it pop louder.

## 2015-04-17 NOTE — Discharge Instructions (Signed)
You have been seen today for knee pain and swelling. Follow-up with orthopedics as soon as possible. Follow up with PCP as needed. Return to ED should symptoms worsen. Use ice in 15 minute intervals. Ibuprofen for pain and inflammation. Keep the leg elevated on a pillow as much as possible. Keep the leg in the immobilizer at all times except when sleeping or showering.

## 2015-04-17 NOTE — ED Notes (Signed)
Declined W/C at D/C and was escorted to lobby by RN. 

## 2015-04-17 NOTE — ED Provider Notes (Signed)
CSN: [id number]     Arrival date & time 04/17/15  1353 History  By signing my name below, I, Shirley [name], attest that this documentation has been prepared under the direction and in the presence of Shirley Fouche C. Turner Kunzman, Shirley [name]. Electronically Signed: Rayna [name], ED Scribe. 04/17/2015. 3:29 PM.   Chief Complaint  Patient presents with  . Knee Injury   The history is provided by the patient. No language interpreter was used.    HPI Comments: Shirley [name] is a 39 y.o. female with a hx of obesity, CAD, CVA, and peripheral neuropathy who presents to the Emergency Department complaining of constant, 10/10, stabbing/squeezing, right anterior knee pain with onset last night. Pt notes she was getting into bed and felt a pop she describes as "the bone feeling out of place" resulting in her pain with a DROM and moderate swelling to the affected joint. She notes worsening pain with any movement or weight. Pt denies a hx of injuries or surgeries to the affected knee or a hx including falls, MVCs, or other trauma. She denies a PMHx of DVT/PE. She denies fevers/chills, nausea/vomiting, lower leg swelling, or any other pain or complaints.  Past Medical History  Diagnosis Date  . Thrombocytosis ([place])     Hem/Onc suggested 2/2 chronic hepatits and/or iron deficiency anemia  . Iron deficiency anemia   . N&V (nausea and vomiting)     Chronic. Unclear etiology with multiple admission and ED visits. CT abdomen with and without contrast (02/2011)  showed no acute process. Gastic Emptying scan (01/2010) was normal. Ultrasound of the abdomen was within normal limits. Hepatitis [name] viral load was undectable. HIV NR. EGD - gastritis, Hpylori + s/p Rx  . Hypertension   . Hyperlipidemia   . Diabetes mellitus type 2, uncontrolled, with complications ([place])   . Recurrent boils   . CAD (coronary artery disease) 06/15/2006    s/p Subendocardial MI with PDA angioplasty(no stent) on 06/15/06 and relook  cath 06/19/06 showed  patency of site. Cath 12/10- no restenosis or significant CAD progression  . Irregular menses     Small ovarian follicles seen on [id number])  . History of pyelonephritis     H/o GrpB Pyelonephritis (9/06) and UTI- 07/11- E.Coli, 12/10- GBS  . Abscess of tunica vaginalis     10/09- Abundant S. aureus- sensitive to all abx  . Obesity   . Gastritis   . Depression   . Peripheral neuropathy ([place])   . Fibromyalgia   . GERD (gastroesophageal reflux disease)   . Anxiety   . Gastroparesis     secondary to poorly controlled DM, last emptying study performed 01/2010  was normal but may be falsely positive as pt was on reglan  . Blood dyscrasia   . Substance abuse   . MI (myocardial infarction) ([place]) 05/2006    PDA percutaneous transluminal coronary angioplasty  . OSA (obstructive sleep apnea)     "suppose to wear mask but I don't" (04/22/2014)  . Seasonal asthma   . Hepatitis [name], chronic (HCC)     Hep BeAb+,Hep [name] cAb+ & Hep BsAg+ (9/06)  . Migraine     "weekly" (04/22/2014)  . CVA (cerebral vascular accident) ([place]) ~ 02/2014    denies residual on 04/22/2014  . CVA (cerebral vascular accident) [facility])     history of remote right cerebellar infarct noted on head CT at least since 10/2011  . Pneumonia     "this is probably the 2nd or 3rd time I've had  pneumonia" (04/22/2014)   Past Surgical History  Procedure Laterality Date  . Cesarean section  1997  . Coronary angioplasty with stent placement  2008    "2 stents"  . Esophagogastroduodenoscopy N/A 04/23/2014    Procedure: ESOPHAGOGASTRODUODENOSCOPY (EGD);  Surgeon: Winfield Cunas., Shirley [name];  Location: [facility] ENDOSCOPY;  Service: Endoscopy;  Laterality: N/A;   Family History  Problem Relation Age of Onset  . Diabetes Father    Social History  Substance Use Topics  . Smoking status: Former Smoker    Types: Cigarettes    Quit date: 04/24/1996  . Smokeless tobacco: Never Used     Comment: quit smoking cigarettes age 39  . Alcohol Use: 0.0  oz/week    0 Standard drinks or equivalent per week     Comment: 04/22/2014 "might have a few drinks a month"   OB History    No data available     Review of Systems  Constitutional: Negative for fever, chills and diaphoresis.  Musculoskeletal: Positive for joint swelling (Right knee) and arthralgias (Right knee).  Skin: Negative for wound.  Neurological: Negative for dizziness, tremors, weakness, light-headedness and numbness.    Allergies  Eggs or egg-derived products; Lisinopril; and Morphine and related  Home Medications   Prior to Admission medications   Medication Sig Start Date End Date Taking? Authorizing Provider  albuterol (PROVENTIL HFA;VENTOLIN HFA) 108 (90 BASE) MCG/ACT inhaler Inhale 2 puffs into the lungs every 4 (four) hours as needed for wheezing or shortness of breath. 04/26/14   Shirley Sherral Hammers, Shirley [name]  aspirin 81 MG chewable tablet Chew 81 mg by mouth daily at 12 noon. 12/18/14   Historical Provider, Shirley [name]  atorvastatin (LIPITOR) 80 MG tablet Take 1 tablet (80 mg total) by mouth daily at 6 PM. 04/02/15   Shirley Pile, Shirley [name]  diclofenac sodium (VOLTAREN) 1 % GEL Apply 2 g topically 4 (four) times daily. 04/17/15   Shirley Vizcarrondo C Joncarlos Atkison, Shirley [name]  ferrous sulfate 325 (65 FE) MG tablet Take 1 tablet (325 mg total) by mouth daily with breakfast. 04/02/15   Shirley Pile, Shirley [name]  ibuprofen (ADVIL,MOTRIN) 800 MG tablet Take 1 tablet (800 mg total) by mouth 3 (three) times daily. 04/17/15   Shirley Ku C Renny Remer, Shirley [name]  Insulin Glargine (LANTUS SOLOSTAR) 100 UNIT/ML Solostar Pen Inject 20 Units into the skin daily at 10 pm. 04/02/15   Shirley Pile, Shirley [name]  Insulin Pen Needle 31G X 5 MM MISC Inject 20 units of Lantus at night before bed 04/02/15   Shirley Pile, Shirley [name]  labetalol (NORMODYNE) 100 MG tablet Take 1 tablet (100 mg total) by mouth 2 (two) times daily. 04/02/15   Shirley Pile, Shirley [name]  metoCLOPramide (REGLAN) 10 MG tablet Take 1 tablet (10 mg total) by mouth 3 (three) times daily before meals. 04/02/15   Shirley Pile, Shirley [name]  omeprazole (PRILOSEC) 40 MG capsule Take 1 capsule (40 mg total) by mouth daily. 04/02/15   Shirley Pile, Shirley [name]  oxyCODONE-acetaminophen (PERCOCET/ROXICET) 5-325 MG tablet Take 2 tablets by mouth every 4 (four) hours as needed for severe pain. 04/17/15   Shirley Butner C Deontay Ladnier, Shirley [name]  promethazine (PHENERGAN) 12.5 MG tablet Take 1 tablet (12.5 mg total) by mouth every 6 (six) hours as needed for nausea or vomiting. 04/02/15   Shirley Pile, Shirley [name]   BP 132/101 mmHg  Pulse 104  Temp(Src) 98.1 F (36.7 C) (Oral)  Resp 20  Ht 5\' 7"  (1.702 m)  Wt 92.987 kg  BMI 32.10 kg/m2  SpO2 99%  LMP 03/29/2015  Physical Exam  Constitutional: She is oriented to person, place, and time. She appears well-developed and well-nourished.  HENT:  Head: Normocephalic and atraumatic.  Mouth/Throat: No oropharyngeal exudate.  Neck: Normal range of motion.  Cardiovascular: Normal rate, regular rhythm, normal heart sounds and intact distal pulses.   Pulmonary/Chest: Effort normal and breath sounds normal. No respiratory distress.  Musculoskeletal: She exhibits edema and tenderness.  Moderate effusion over right knee with tenderness across all aspects of the knee; ROM limited due to pain. Pulse, motor, and sensory intact distal to the injury. No lower leg swelling. No laxity.  Neurological: She is alert and oriented to person, place, and time. She has normal reflexes.  No sensory deficits. Strength 5 out of 5 distal to the injury.  Skin: Skin is warm and dry. She is not diaphoretic.  Nursing note and vitals reviewed.   ED Course  Procedures  DIAGNOSTIC STUDIES: Oxygen Saturation is 100% on RA, normal by my interpretation.    COORDINATION OF CARE: 3:25 PM Pt presents today due to right knee pain. Discussed imaging results and next steps for treatment. Return precautions noted. Pt understood and agreed to the plan.   Labs Review Labs Reviewed - No data to display  Imaging Review Dg Knee Complete 4 Views  Right  04/17/2015  CLINICAL DATA:  [name] right knee popped last night while getting into bed. Pain. Initial encounter. EXAM: RIGHT KNEE - COMPLETE 4+ VIEW COMPARISON:  None. FINDINGS: Moderate joint effusion is seen. No bony abnormality is identified. Joint spaces are preserved. IMPRESSION: Moderate joint effusion.  Otherwise negative. Electronically Signed   By: Inge Rise M.D.   On: 04/17/2015 14:23   I have personally reviewed and evaluated these images as part of my medical decision-making.   EKG Interpretation None      MDM   Final diagnoses:  Right knee pain    Shirley [name] presents with right knee pain after feeling a "pop" last night while getting into bed.  Findings and plan of care discussed with Shirley Ferguson, Shirley [name].  Patient has no instability in the knee joint. She has no indicators that would point to suspicion for DVT. Doubt septic joint for this patient. This patient was also seen by Dr. Roderic Palau, who agreed with the assessment. We determined that performing an arthrocentesis would have a low benefits versus risk in this case. Patient to be put into a knee immobilizer, given crutches, pain management, and orthopedic referral. Patient was given instructions for home care as well as return precautions. Return precautions instructions included listing symptoms of DVT and PE. Patient voiced understanding of these instructions, accepts the plan, and is comfortable with discharge.  I personally performed the services described in this documentation, which was scribed in my presence. The recorded information has been reviewed and is accurate.     Shirley Bender, Shirley [name] 04/17/15 1658  Shirley Ferguson, Shirley [name] 04/19/15 [phone]

## 2015-08-03 ENCOUNTER — Encounter: Payer: Self-pay | Admitting: Internal Medicine

## 2015-10-18 ENCOUNTER — Telehealth: Payer: Self-pay

## 2015-10-18 NOTE — Telephone Encounter (Signed)
Chase from [facility] requesting the nurse to call back regarding pt med.

## 2015-10-18 NOTE — Telephone Encounter (Signed)
 [facility], offered appt, they state it will not be possible for pt to come to an appt in the near future

## 2015-11-03 ENCOUNTER — Encounter (HOSPITAL_COMMUNITY): Payer: Self-pay

## 2015-11-03 ENCOUNTER — Emergency Department (HOSPITAL_COMMUNITY): Payer: Medicaid Other

## 2015-11-03 ENCOUNTER — Emergency Department (HOSPITAL_COMMUNITY)
Admission: EM | Admit: 2015-11-03 | Discharge: 2015-11-03 | Disposition: A | Discharge status: Home / Self Care | Payer: Medicaid Other | Attending: Emergency Medicine | Admitting: Emergency Medicine

## 2015-11-03 DIAGNOSIS — E785 Hyperlipidemia, unspecified: Secondary | ICD-10-CM | POA: Insufficient documentation

## 2015-11-03 DIAGNOSIS — Z794 Long term (current) use of insulin: Secondary | ICD-10-CM | POA: Insufficient documentation

## 2015-11-03 DIAGNOSIS — Z7951 Long term (current) use of inhaled steroids: Secondary | ICD-10-CM | POA: Insufficient documentation

## 2015-11-03 DIAGNOSIS — N390 Urinary tract infection, site not specified: Secondary | ICD-10-CM | POA: Insufficient documentation

## 2015-11-03 DIAGNOSIS — Z6831 Body mass index (BMI) 31.0-31.9, adult: Secondary | ICD-10-CM | POA: Insufficient documentation

## 2015-11-03 DIAGNOSIS — Z8673 Personal history of transient ischemic attack (TIA), and cerebral infarction without residual deficits: Secondary | ICD-10-CM | POA: Insufficient documentation

## 2015-11-03 DIAGNOSIS — F329 Major depressive disorder, single episode, unspecified: Secondary | ICD-10-CM | POA: Insufficient documentation

## 2015-11-03 DIAGNOSIS — Z87891 Personal history of nicotine dependence: Secondary | ICD-10-CM | POA: Insufficient documentation

## 2015-11-03 DIAGNOSIS — E669 Obesity, unspecified: Secondary | ICD-10-CM | POA: Insufficient documentation

## 2015-11-03 DIAGNOSIS — E119 Type 2 diabetes mellitus without complications: Secondary | ICD-10-CM | POA: Insufficient documentation

## 2015-11-03 DIAGNOSIS — I2510 Atherosclerotic heart disease of native coronary artery without angina pectoris: Secondary | ICD-10-CM | POA: Insufficient documentation

## 2015-11-03 DIAGNOSIS — M25561 Pain in right knee: Secondary | ICD-10-CM | POA: Insufficient documentation

## 2015-11-03 DIAGNOSIS — I10 Essential (primary) hypertension: Secondary | ICD-10-CM | POA: Insufficient documentation

## 2015-11-03 DIAGNOSIS — I252 Old myocardial infarction: Secondary | ICD-10-CM | POA: Insufficient documentation

## 2015-11-03 DIAGNOSIS — Z79899 Other long term (current) drug therapy: Secondary | ICD-10-CM | POA: Insufficient documentation

## 2015-11-03 LAB — CBC WITH DIFFERENTIAL/PLATELET
Basophils Absolute: 0 10*3/uL (ref 0.0–0.1)
Basophils Relative: 0 %
Eosinophils Absolute: 0.4 10*3/uL (ref 0.0–0.7)
Eosinophils Relative: 3 %
HCT: 27.2 % — ABNORMAL LOW (ref 36.0–46.0)
Hemoglobin: 8.6 g/dL — ABNORMAL LOW (ref 12.0–15.0)
Lymphocytes Relative: 25 %
Lymphs Abs: 3.1 10*3/uL (ref 0.7–4.0)
MCH: 20.2 pg — ABNORMAL LOW (ref 26.0–34.0)
MCHC: 31.6 g/dL (ref 30.0–36.0)
MCV: 64 fL — ABNORMAL LOW (ref 78.0–100.0)
Monocytes Absolute: 1.1 10*3/uL — ABNORMAL HIGH (ref 0.1–1.0)
Monocytes Relative: 9 %
Neutro Abs: 7.9 10*3/uL — ABNORMAL HIGH (ref 1.7–7.7)
Neutrophils Relative %: 63 %
Platelets: 504 10*3/uL — ABNORMAL HIGH (ref 150–400)
RBC: 4.25 MIL/uL (ref 3.87–5.11)
RDW: 18.3 % — ABNORMAL HIGH (ref 11.5–15.5)
WBC: 12.5 10*3/uL — ABNORMAL HIGH (ref 4.0–10.5)

## 2015-11-03 LAB — URINALYSIS, ROUTINE W REFLEX MICROSCOPIC
BILIRUBIN URINE: NEGATIVE
Ketones, ur: NEGATIVE mg/dL
Nitrite: POSITIVE — AB
PH: 5.5 (ref 5.0–8.0)
Protein, ur: 30 mg/dL — AB
SPECIFIC GRAVITY, URINE: 1.027 (ref 1.005–1.030)

## 2015-11-03 LAB — BASIC METABOLIC PANEL
Anion gap: 6 (ref 5–15)
BUN: 12 mg/dL (ref 6–20)
CO2: 25 mmol/L (ref 22–32)
Calcium: 9.1 mg/dL (ref 8.9–10.3)
Chloride: 108 mmol/L (ref 101–111)
Creatinine, Ser: 0.71 mg/dL (ref 0.44–1.00)
GFR calc Af Amer: >60 mL/min (ref >60)
GFR calc non Af Amer: >60 mL/min (ref >60)
Glucose, Bld: 99 mg/dL (ref 65–99)
Potassium: 3.6 mmol/L (ref 3.5–5.1)
Sodium: 139 mmol/L (ref 135–145)

## 2015-11-03 LAB — URINE MICROSCOPIC-ADD ON: RBC / HPF: NONE SEEN RBC/hpf (ref 0–5)

## 2015-11-03 LAB — WET PREP, GENITAL
Sperm: NONE SEEN
Trich, Wet Prep: NONE SEEN
Yeast Wet Prep HPF POC: NONE SEEN

## 2015-11-03 LAB — POC URINE PREG, ED: Preg Test, Ur: NEGATIVE

## 2015-11-03 LAB — CBG MONITORING, ED: Glucose-Capillary: 335 mg/dL — ABNORMAL HIGH (ref 65–99)

## 2015-11-03 MED ORDER — AZITHROMYCIN 250 MG PO TABS
1000.0000 mg | ORAL_TABLET | Freq: Once | ORAL | Status: DC
  Filled 2015-11-03: qty 4

## 2015-11-03 MED ORDER — CEPHALEXIN 500 MG PO CAPS
500.0000 mg | ORAL_CAPSULE | Freq: Once | ORAL | Status: AC
  Administered 2015-11-03: 500 mg via ORAL
  Filled 2015-11-03: qty 1

## 2015-11-03 MED ORDER — DOXYCYCLINE HYCLATE 100 MG PO CAPS: 100.0000 mg | ORAL_CAPSULE | Freq: Two times a day (BID) | ORAL | Status: DC

## 2015-11-03 MED ORDER — CEPHALEXIN 500 MG PO CAPS: 500.0000 mg | ORAL_CAPSULE | Freq: Two times a day (BID) | ORAL | Status: DC

## 2015-11-03 MED ORDER — CEFTRIAXONE SODIUM 250 MG IJ SOLR
250.0000 mg | Freq: Once | INTRAMUSCULAR | Status: AC
  Administered 2015-11-03: 250 mg via INTRAMUSCULAR
  Filled 2015-11-03: qty 250

## 2015-11-03 MED ORDER — LIDOCAINE HCL 2 % IJ SOLN
INTRAMUSCULAR | Status: AC
  Administered 2015-11-03: 20 mg
  Filled 2015-11-03: qty 20

## 2015-11-03 MED ORDER — CEPHALEXIN 250 MG PO CAPS: 250.0000 mg | ORAL_CAPSULE | Freq: Once | ORAL | Status: DC

## 2015-11-03 NOTE — ED Provider Notes (Signed)
CSN: [id number]     Arrival date & time 11/03/15  1321 History   First MD Initiated Contact with Patient 11/03/15 1706     Chief Complaint  Patient presents with  . Bladder pressure   . Back Pain  . Knee Pain     (Consider location/radiation/quality/duration/timing/severity/associated sxs/prior Treatment) HPI Comments: Patient is a 40 year old female with history of diabetes with neuropathy and gastroparesis, IDA, depression, anxiety who presents with a two-week history of suprapubic pressure, decreased urine output, urinary frequency, and bilateral low back pain. Patient states that she has urinary urgency, however when she goes to urinate she only has droplets. He describes her suprapubic pain as severe pressure. She has had associated nausea, but no vomiting. Patient does have gastroparesis and experiences nausea, however she feels like it's been worse since the symptoms have started. Patient also endorses a white vaginal discharge and dyspareunia. Patient's LMP was April 28, and the patient states that her periods are normally fairly regular. Patient also reports right knee pain since December. 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 The patient never followed up with orthopedics. Patient states she continues to have swelling and stiffness and feels like she has had to drag her leg. Patient denies any fevers, chest pain, shortness of breath.  Patient is a 40 y.o. female presenting with back pain and knee pain. The history is provided by the patient.  Back Pain Associated symptoms: no abdominal pain, no chest pain, no dysuria, no fever and no headaches   Knee Pain Associated symptoms: back pain (bilateral low back pain)   Associated symptoms: no fever     Past Medical History  Diagnosis Date  . Thrombocytosis ([place])     Hem/Onc suggested 2/2 chronic  hepatits and/or iron deficiency anemia  . Iron deficiency anemia   . N&V (nausea and vomiting)     Chronic. Unclear etiology with multiple admission and ED visits. CT abdomen with and without contrast (02/2011)  showed no acute process. Gastic Emptying scan (01/2010) was normal. Ultrasound of the abdomen was within normal limits. Hepatitis B viral load was undectable. HIV NR. EGD - gastritis, Hpylori + s/p Rx  . Hypertension   . Hyperlipidemia   . Diabetes mellitus type 2, uncontrolled, with complications ([place])   . Recurrent boils   . 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 Cath 12/10- no restenosis or significant CAD progression  . Irregular menses     Small ovarian follicles seen on [id number]  . History of pyelonephritis     H/o GrpB Pyelonephritis (9/06) and UTI- 07/11- E.Coli, 12/10- GBS  . Abscess of tunica vaginalis     10/09- Abundant S. aureus- sensitive to all abx  . Obesity   . Gastritis   . Depression   . Peripheral neuropathy ([place])   . Fibromyalgia   . GERD (gastroesophageal reflux disease)   . Anxiety   . Gastroparesis     secondary to poorly controlled DM, last emptying study performed 01/2010  was normal but may be falsely positive as pt was on reglan  . Blood dyscrasia   . Substance abuse   . MI (myocardial infarction) ([place]) 05/2006    PDA percutaneous transluminal coronary angioplasty  . OSA (obstructive sleep apnea)     "  suppose to wear mask but I don't" (04/22/2014)  . Seasonal asthma   . Hepatitis B, chronic (HCC)     Hep BeAb+,Hep B cAb+ & Hep BsAg+ (9/06)  . Migraine     "weekly" (04/22/2014)  . CVA (cerebral vascular accident) ([place]) ~ 02/2014    denies residual on 04/22/2014  . CVA (cerebral vascular accident) [facility])     history of remote right cerebellar infarct noted on head CT at least since 10/2011  . Pneumonia     "this is probably the  2nd or 3rd time I've had pneumonia" (04/22/2014)   Past Surgical History  Procedure Laterality Date  . Cesarean section  1997  . Coronary angioplasty with stent placement  2008    "2 stents"  . Esophagogastroduodenoscopy N/A 04/23/2014    Procedure: ESOPHAGOGASTRODUODENOSCOPY (EGD);  Surgeon: Winfield Cunas., MD;  Location: [facility] ENDOSCOPY;  Service: Endoscopy;  Laterality: N/A;   Family History  Problem Relation Age of Onset  . Diabetes Father    Social History  Substance Use Topics  . Smoking status: Former Smoker    Types: Cigarettes    Quit date: 04/24/1996  . Smokeless tobacco: Never Used     Comment: quit smoking cigarettes age 19  . Alcohol Use: 0.0 oz/week    0 Standard drinks or equivalent per week     Comment: 04/22/2014 "might have a few drinks a month"   OB History    No data available     Review of Systems  Constitutional: Negative for fever and chills.  HENT: Negative for facial swelling and sore throat.   Respiratory: Negative for shortness of breath.   Cardiovascular: Negative for chest pain.  Gastrointestinal: Negative for nausea, vomiting and abdominal pain.  Genitourinary: Positive for urgency, decreased urine volume, vaginal discharge, difficulty urinating and dyspareunia. Negative for dysuria.  Musculoskeletal: Positive for back pain (bilateral low back pain), joint swelling (R knee) and arthralgias (R knee).  Skin: Negative for rash and wound.  Neurological: Negative for headaches.  Psychiatric/Behavioral: The patient is not nervous/anxious.       Allergies  Eggs or egg-derived products; Lisinopril; and Morphine and related  Home Medications   Prior to Admission medications   Medication Sig Start Date End Date Taking? Authorizing Provider  albuterol (PROVENTIL HFA;VENTOLIN HFA) 108 (90 BASE) MCG/ACT inhaler Inhale 2 puffs into the lungs every 4 (four) hours as needed for wheezing or shortness of breath. 04/26/14  Yes Alexa Angela Burke, MD    atorvastatin (LIPITOR) 80 MG tablet Take 1 tablet (80 mg total) by mouth daily at 6 PM. Patient not taking: Reported on 11/03/2015 04/02/15   Maryellen Pile, MD  cephALEXin (KEFLEX) 500 MG capsule Take 1 capsule (500 mg total) by mouth 2 (two) times daily. 11/03/15   Frederica Kuster, PA-C  diclofenac sodium (VOLTAREN) 1 % GEL Apply 2 g topically 4 (four) times daily. Patient not taking: Reported on 11/03/2015 04/17/15   Helane Gunther Joy, PA-C  doxycycline (VIBRAMYCIN) 100 MG capsule Take 1 capsule (100 mg total) by mouth 2 (two) times daily. 11/03/15   Frederica Kuster, PA-C  ferrous sulfate 325 (65 FE) MG tablet Take 1 tablet (325 mg total) by mouth daily with breakfast. Patient not taking: Reported on 11/03/2015 04/02/15   Maryellen Pile, MD  Insulin Glargine (LANTUS SOLOSTAR) 100 UNIT/ML Solostar Pen Inject 20 Units into the skin daily at 10 pm. 04/02/15   Maryellen Pile, MD  Insulin Pen Needle 31G X 5 MM  MISC Inject 20 units of Lantus at night before bed 04/02/15   Maryellen Pile, MD  labetalol (NORMODYNE) 100 MG tablet Take 1 tablet (100 mg total) by mouth 2 (two) times daily. Patient not taking: Reported on 11/03/2015 04/02/15   Maryellen Pile, MD  omeprazole (PRILOSEC) 40 MG capsule Take 1 capsule (40 mg total) by mouth daily. Patient not taking: Reported on 11/03/2015 04/02/15   Maryellen Pile, MD   BP 162/98 mmHg  Pulse 99  Temp(Src) 98.7 F (37.1 C) (Oral)  Resp 18  Ht 5\' 7"  (1.702 m)  Wt 92.534 kg  BMI 31.94 kg/m2  SpO2 100%  LMP 08/04/2015 Physical Exam  Constitutional: She appears well-developed and well-nourished. No distress.  HENT:  Head: Normocephalic and atraumatic.  Mouth/Throat: Oropharynx is clear and moist. No oropharyngeal exudate.  Eyes: Conjunctivae are normal. Pupils are equal, round, and reactive to light. Right eye exhibits no discharge. Left eye exhibits no discharge. No scleral icterus.  Neck: Normal range of motion. Neck supple. No thyromegaly present.  Cardiovascular:  Normal rate, regular rhythm, normal heart sounds and intact distal pulses.  Exam reveals no gallop and no friction rub.   No murmur heard. Pulmonary/Chest: Effort normal and breath sounds normal. No stridor. No respiratory distress. She has no wheezes. She has no rales.  Abdominal: Soft. Bowel sounds are normal. She exhibits no distension. There is tenderness in the right lower quadrant, periumbilical area, suprapubic area and left lower quadrant. There is no rebound, no guarding and no CVA tenderness.    Genitourinary: There is no rash, tenderness or lesion on the right labia. There is no rash, tenderness or lesion on the left labia. Uterus is not enlarged and not tender. Cervix exhibits motion tenderness and discharge (white, cottage cheese like). Right adnexum displays no mass, no tenderness and no fullness. Left adnexum displays no mass, no tenderness and no fullness. No tenderness or bleeding in the vagina. Vaginal discharge (white, cottage cheese like) found.  Musculoskeletal: She exhibits no edema.       Right knee: She exhibits swelling and effusion. She exhibits no LCL laxity, no bony tenderness and no MCL laxity. Tenderness found. Patellar tendon tenderness noted. No MCL and no LCL tenderness noted.       Lumbar back: She exhibits tenderness. She exhibits no bony tenderness.       Back:  Right knee: Positive anterior and posterior drawer, negative McMurray's, no pain with varus or valgus stress; pain with flexion; 5/5 in bilateral lower extremities; normal sensation bilaterally; pulses intact; cap refill <2 secs; no warmth or erythema noted noted; full range of motion intact  Lymphadenopathy:    She has no cervical adenopathy.  Neurological: She is alert. Coordination normal.  Skin: Skin is warm and dry. No rash noted. She is not diaphoretic. No pallor.  Psychiatric: She has a normal mood and affect.  Nursing note and vitals reviewed.   ED Course  Procedures (including critical care  time) Labs Review Labs Reviewed  WET PREP, GENITAL - Abnormal; Notable for the following:    Clue Cells Wet Prep HPF POC PRESENT (*)    WBC, Wet Prep HPF POC FEW (*)    All other components within normal limits  URINALYSIS, ROUTINE W REFLEX MICROSCOPIC (NOT AT [facility]) - Abnormal; Notable for the following:    APPearance TURBID (*)    Glucose, UA >1000 (*)    Hgb urine dipstick SMALL (*)    Protein, ur 30 (*)    Nitrite  POSITIVE (*)    Leukocytes, UA LARGE (*)    All other components within normal limits  CBC WITH DIFFERENTIAL/PLATELET - Abnormal; Notable for the following:    WBC 12.5 (*)    Hemoglobin 8.6 (*)    HCT 27.2 (*)    MCV 64.0 (*)    MCH 20.2 (*)    RDW 18.3 (*)    Platelets 504 (*)    All other components within normal limits  URINE MICROSCOPIC-ADD ON - Abnormal; Notable for the following:    Squamous Epithelial / LPF 6-30 (*)    Bacteria, UA MANY (*)    All other components within normal limits  CBG MONITORING, ED - Abnormal; Notable for the following:    Glucose-Capillary 335 (*)    All other components within normal limits  BASIC METABOLIC PANEL  RPR  HIV ANTIBODY (ROUTINE TESTING)  POC URINE PREG, ED  GC/CHLAMYDIA PROBE AMP ([place]) NOT AT [facility]    Imaging Review Dg Knee Complete 4 Views Right  11/03/2015  CLINICAL DATA:  Progressive knee pain.  Injury 7 months prior EXAM: RIGHT KNEE - COMPLETE 4+ VIEW COMPARISON:  April 17, 2015 FINDINGS: Frontal, lateral, and bilateral oblique views were obtained. There is no demonstrable fracture or dislocation. Joint effusion is significantly smaller than on prior study. Small residual joint effusion remains. Joint spaces appear normal. No erosive change. IMPRESSION: Small joint effusion, considerably smaller compared to prior study. No fracture or dislocation. No apparent arthropathy. Electronically Signed   By: Lowella Grip III M.D.   On: 11/03/2015 14:14   I have personally reviewed and evaluated these images  and lab results as part of my medical decision-making.   EKG Interpretation None      MDM   Patient with urinary tract infection. UA shows small hematuria, large leukocytes, positive nitrites, many bacteria. Wet prep shows few white blood cells, clue cells. HIV, RPR sent. Urine pregnancy negative. CBC shows 12.5 WBC (most likely due to UTI), chronic anemia and chronic elevated platelets. BMP unremarkable. Patient given first dose of Keflex in ED. Due to cervical motion tenderness and patient concern for STD exposure, I will treat patient for PID with ceftriaxone in ED and doxycycline. Patient to be called in 2-3 days if any of her test results return positive. Patient advised to make all sexual partners aware if she is positive that they will need to be treated as well.  Patient also with knee pain since a possible dislocation in December. Right knee x-ray shows small joint effusion that is considerably smaller compared to December study; no fracture dislocation or apparent arthropathy. I doubt infectious etiology due to no warmth and full range of motion. Patient given knee sleeve and follow-up to orthopedics. Patient ambulatory in ED.  Patient understands and agrees with plan. Patient vitals stable throughout ED course and discharged in satisfactory condition. I discussed patient with Dr. Eulis Foster who is in agreement with plan.  Final diagnoses:  UTI (lower urinary tract infection)  Right knee pain        Frederica Kuster, PA-C 11/03/15 2117  Frederica Kuster, PA-C 11/03/15 [place], MD 11/05/15 [phone]

## 2015-11-03 NOTE — ED Notes (Signed)
Pt ambulatory and independent at discharge.  Verbalized understanding of all discharge instructions.

## 2015-11-03 NOTE — ED Notes (Signed)
Pt provided with ice.

## 2015-11-03 NOTE — ED Notes (Signed)
Pt c/o low back pain, bladder pressure, and decreased urinary output x 2 week and R knee pain r/t injury since December 2016.  Pain score 10/10.  Pt reports "the pressure gets worse after I try to pee."  Pt sts she was seen at [facility] for knee pain, but was unable to follow-up w/ ortho.

## 2015-11-03 NOTE — Discharge Instructions (Signed)
Medications: Keflex, Doxycycline  Treatment: Take Keflex as prescribed for 1 week for your urinary tract infection. Take doxycycline as prescribed for 2 weeks for suspected STD and pelvic inflammatory disease. You will be called in 2-3 days if any of your cultures return positive. At that time, please make all of your sexual partners aware that they will need treatment as well. Drink plenty of water, as it will help your urinary tract infection resolved sooner. Wear your knee brace at all times except when bathing for support of your knee. Ice your knee 3-4 times daily alternating 20 minutes on, 20 minutes off.  Follow-up: Please follow-up with Dr. Erlinda Hong for further evaluation of your knee pain. Please follow-up with your primary care provider if you continue to have urinary symptoms following antibiotic treatment. Please return to the emergency department if you develop any new or worsening symptoms.   Urinary Tract Infection Urinary tract infections (UTIs) can develop anywhere along your urinary tract. Your urinary tract is your body's drainage system for removing wastes and extra water. Your urinary tract includes two kidneys, two ureters, a bladder, and a urethra. Your kidneys are a pair of bean-shaped organs. Each kidney is about the size of your fist. They are located below your ribs, one on each side of your spine. CAUSES Infections are caused by microbes, which are microscopic organisms, including fungi, viruses, and bacteria. These organisms are so small that they can only be seen through a microscope. Bacteria are the microbes that most commonly cause UTIs. SYMPTOMS  Symptoms of UTIs may vary by age and gender of the patient and by the location of the infection. Symptoms in young women typically include a frequent and intense urge to urinate and a painful, burning feeling in the bladder or urethra during urination. Older women and men are more likely to be tired, shaky, and weak and have muscle  aches and abdominal pain. A fever may mean the infection is in your kidneys. Other symptoms of a kidney infection include pain in your back or sides below the ribs, nausea, and vomiting. DIAGNOSIS To diagnose a UTI, your caregiver will ask you about your symptoms. Your caregiver will also ask you to provide a urine sample. The urine sample will be tested for bacteria and white blood cells. White blood cells are made by your body to help fight infection. TREATMENT  Typically, UTIs can be treated with medication. Because most UTIs are caused by a bacterial infection, they usually can be treated with the use of antibiotics. The choice of antibiotic and length of treatment depend on your symptoms and the type of bacteria causing your infection. HOME CARE INSTRUCTIONS  If you were prescribed antibiotics, take them exactly as your caregiver instructs you. Finish the medication even if you feel better after you have only taken some of the medication.  Drink enough water and fluids to keep your urine clear or pale yellow.  Avoid caffeine, tea, and carbonated beverages. They tend to irritate your bladder.  Empty your bladder often. Avoid holding urine for long periods of time.  Empty your bladder before and after sexual intercourse.  After a bowel movement, women should cleanse from front to back. Use each tissue only once. SEEK MEDICAL CARE IF:   You have back pain.  You develop a fever.  Your symptoms do not begin to resolve within 3 days. SEEK IMMEDIATE MEDICAL CARE IF:   You have severe back pain or lower abdominal pain.  You develop chills.  You have nausea or vomiting.  You have continued burning or discomfort with urination. MAKE SURE YOU:   Understand these instructions.  Will watch your condition.  Will get help right away if you are not doing well or get worse.   This information is not intended to replace advice given to you by your health care provider. Make sure you  discuss any questions you have with your health care provider.   Document Released: 01/18/2005 Document Revised: 12/30/2014 Document Reviewed: 05/19/2011 Elsevier Interactive Patient Education [employer].

## 2015-11-03 NOTE — ED Notes (Signed)
Unable to collect labs at this time patient on the way to the restroom

## 2015-11-04 LAB — GC/CHLAMYDIA PROBE AMP (~~LOC~~) NOT AT ARMC
Chlamydia: NEGATIVE
Neisseria Gonorrhea: NEGATIVE

## 2015-11-04 LAB — HIV ANTIBODY (ROUTINE TESTING W REFLEX): HIV Screen 4th Generation wRfx: NONREACTIVE

## 2015-11-04 LAB — RPR: RPR Ser Ql: NONREACTIVE

## 2015-11-15 IMAGING — CT CT ABD-PELV W/ CM
1 of 2 series · 16 of 32 positions shown, 20 images · IV contrast (omnipaque)
Comparison: 08/15/2012

CLINICAL DATA: Vomiting

EXAM:
CT ABDOMEN AND PELVIS WITH CONTRAST
TECHNIQUE: Multidetector CT imaging of the abdomen and pelvis was performed
using the standard protocol following bolus administration of
intravenous contrast.
CONTRAST:  25mL OMNIPAQUE IOHEXOL 300 MG/ML SOLN, 100mL OMNIPAQUE
IOHEXOL 300 MG/ML SOLN

[Series 2: abd/pel with · axial · 0.94mm/px · z∈[-346,+109]mm · 16 of 101 slices shown, 20 images]
[im 5/101  soft-tissue]
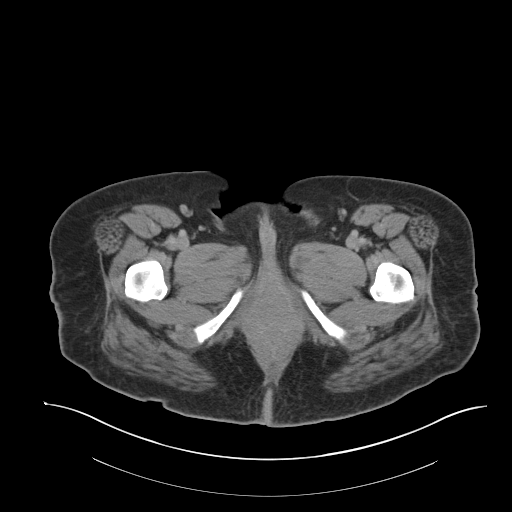
[im 5/101  bone]
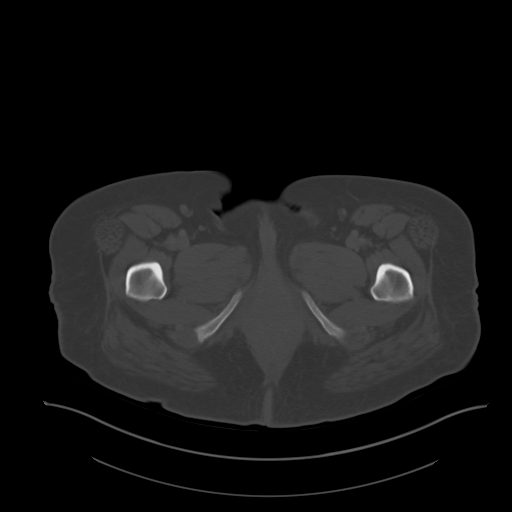
[im 13/101  soft-tissue]
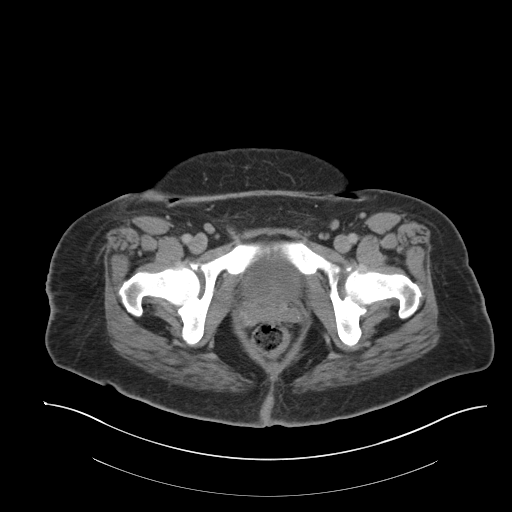
[im 21/101  soft-tissue]
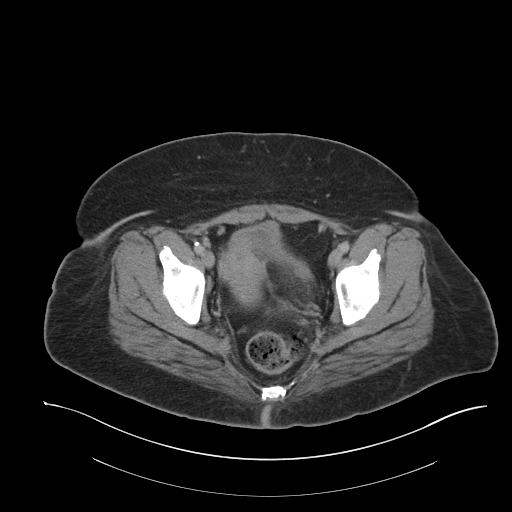
[im 26/101  soft-tissue]
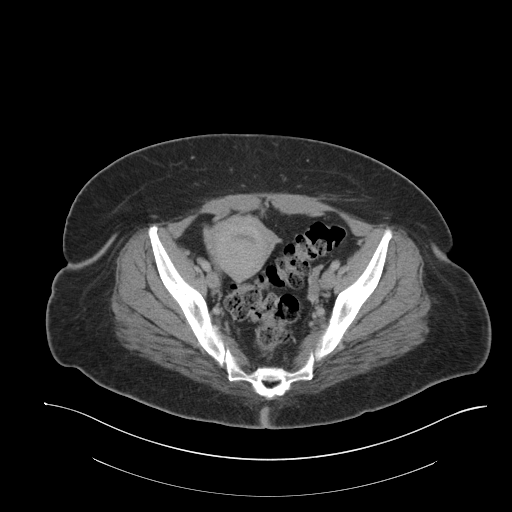
[im 34/101  soft-tissue]
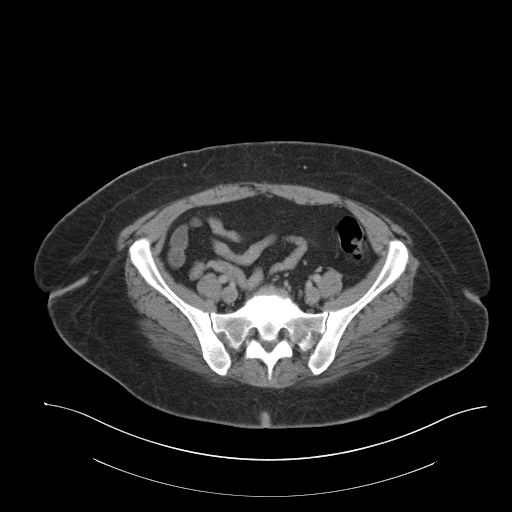
[im 42/101  soft-tissue]
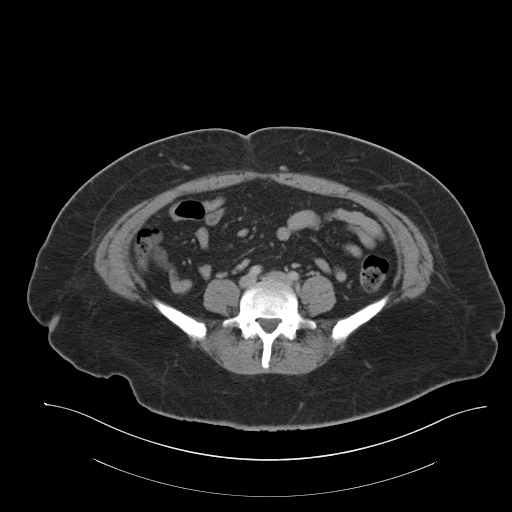
[im 46/101  soft-tissue]
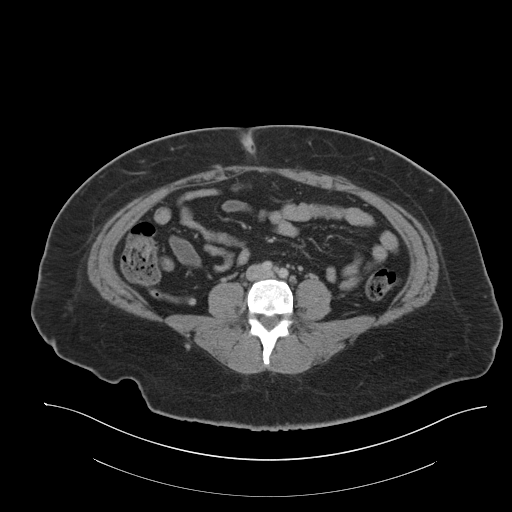
[im 55/101  soft-tissue]
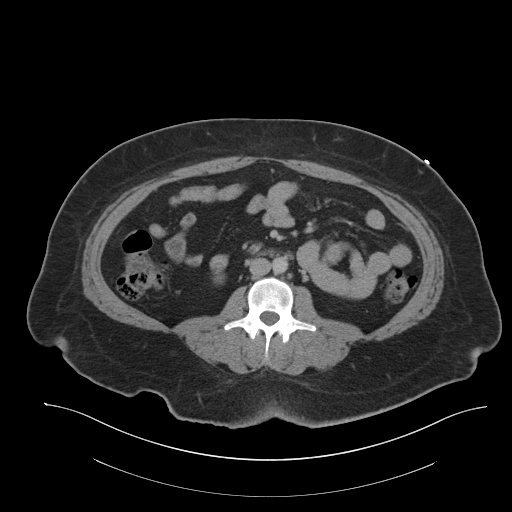
[im 59/101  soft-tissue]
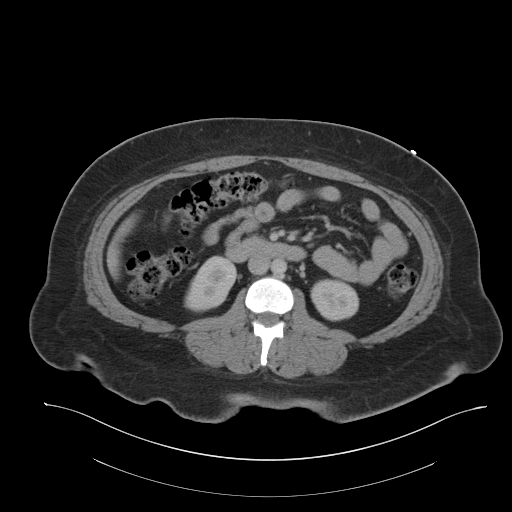
[im 59/101  bone]
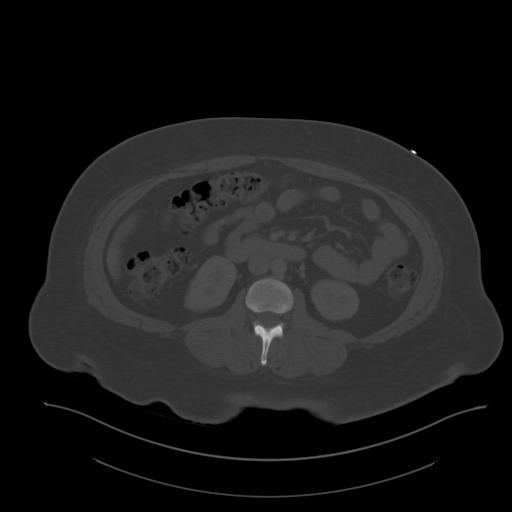
[im 67/101  soft-tissue]
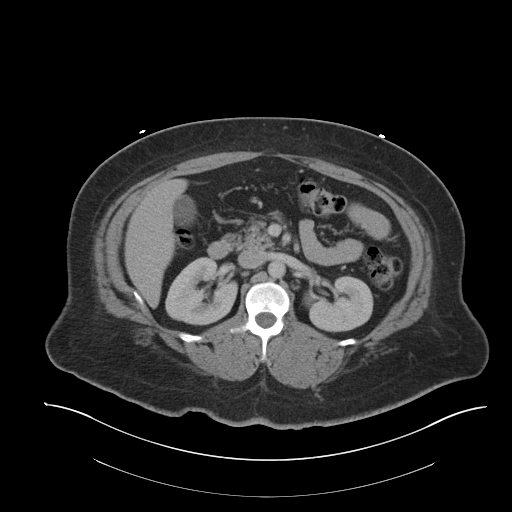
[im 76/101  soft-tissue]
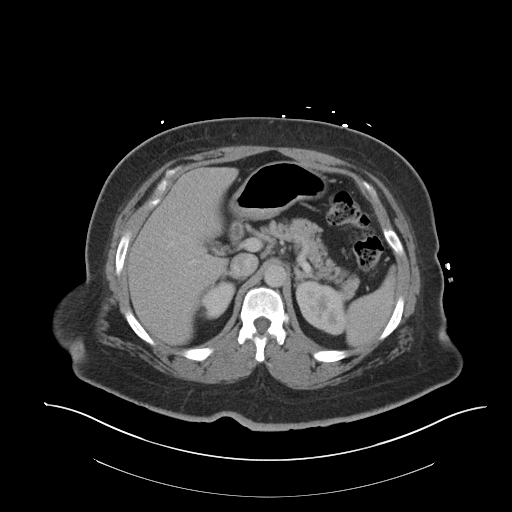
[im 80/101  soft-tissue]
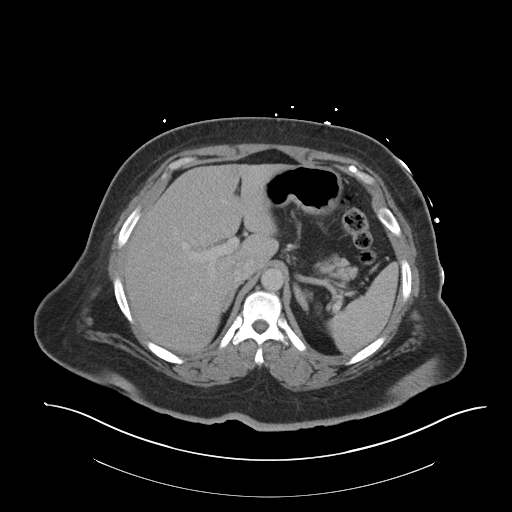
[im 84/101  lung]
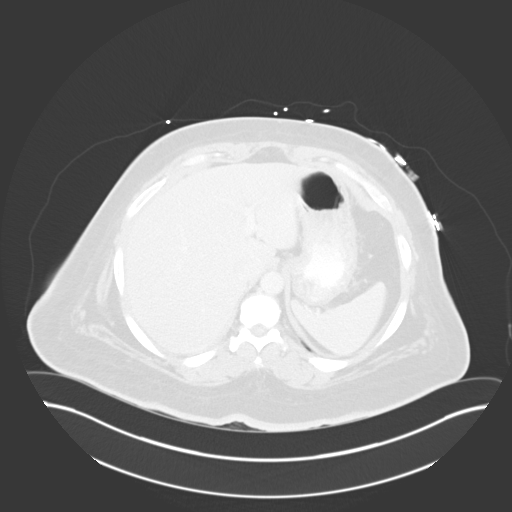
[im 88/101  soft-tissue]
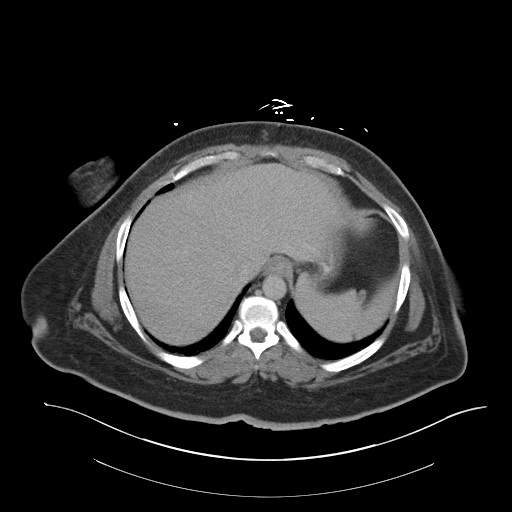
[im 88/101  lung]
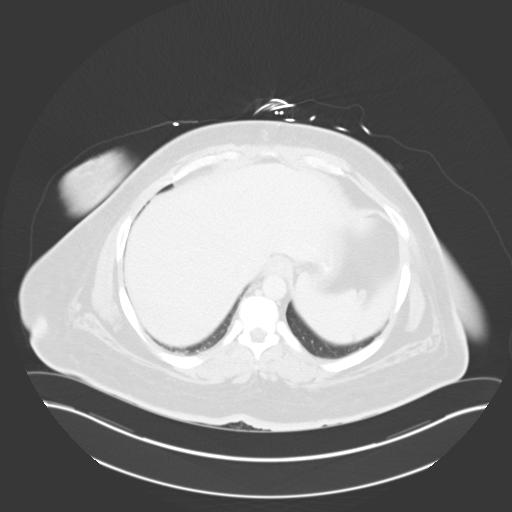
[im 92/101  lung]
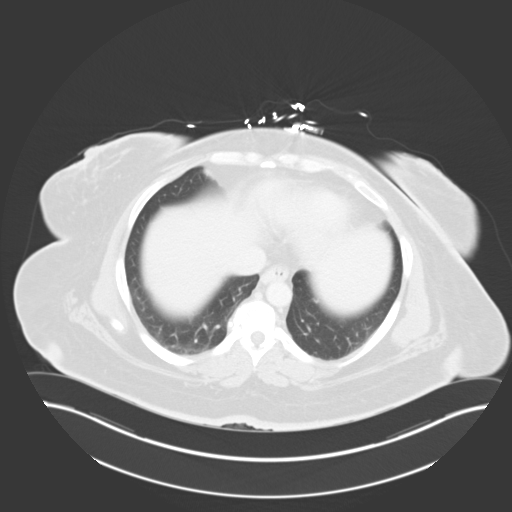
[im 96/101  soft-tissue]
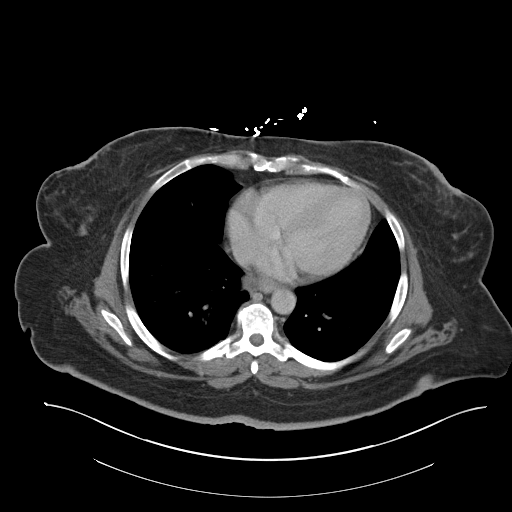
[im 96/101  lung]
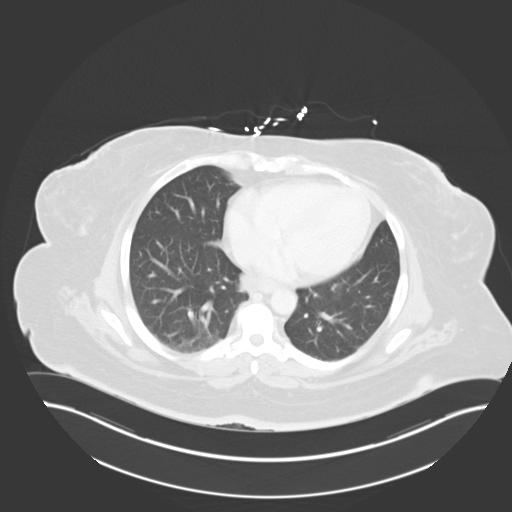

[16 of 32 positions shown; findings below may reference images not displayed]

FINDINGS: Visualized lung bases clear. Unremarkable liver, gallbladder,
spleen, adrenal glands, kidneys, pancreas, abdominal aorta, portal
vein. Stomach is nondistended. Small bowel and colon are nondilated.
Normal appendix. Degenerative disc disease L5-S1. Urinary bladder is
incompletely distended and mildly thick-walled. Uterus and adnexal
regions unremarkable. No ascites. No free air. No adenopathy
localized.
IMPRESSION: 1. No acute abdominal process.

## 2015-11-27 ENCOUNTER — Emergency Department (HOSPITAL_COMMUNITY): Payer: Self-pay

## 2015-11-27 ENCOUNTER — Encounter (HOSPITAL_COMMUNITY): Payer: Self-pay | Admitting: *Deleted

## 2015-11-27 ENCOUNTER — Emergency Department (HOSPITAL_COMMUNITY)
Admission: EM | Admit: 2015-11-27 | Discharge: 2015-11-27 | Disposition: A | Discharge status: Home / Self Care | Payer: Self-pay | Attending: Emergency Medicine | Admitting: Emergency Medicine

## 2015-11-27 DIAGNOSIS — R0602 Shortness of breath: Secondary | ICD-10-CM | POA: Insufficient documentation

## 2015-11-27 DIAGNOSIS — R112 Nausea with vomiting, unspecified: Secondary | ICD-10-CM | POA: Insufficient documentation

## 2015-11-27 DIAGNOSIS — I2510 Atherosclerotic heart disease of native coronary artery without angina pectoris: Secondary | ICD-10-CM | POA: Insufficient documentation

## 2015-11-27 DIAGNOSIS — Z87891 Personal history of nicotine dependence: Secondary | ICD-10-CM | POA: Insufficient documentation

## 2015-11-27 DIAGNOSIS — R1032 Left lower quadrant pain: Secondary | ICD-10-CM | POA: Insufficient documentation

## 2015-11-27 DIAGNOSIS — Z794 Long term (current) use of insulin: Secondary | ICD-10-CM | POA: Insufficient documentation

## 2015-11-27 DIAGNOSIS — Z955 Presence of coronary angioplasty implant and graft: Secondary | ICD-10-CM | POA: Insufficient documentation

## 2015-11-27 DIAGNOSIS — I10 Essential (primary) hypertension: Secondary | ICD-10-CM | POA: Insufficient documentation

## 2015-11-27 DIAGNOSIS — E1143 Type 2 diabetes mellitus with diabetic autonomic (poly)neuropathy: Secondary | ICD-10-CM | POA: Insufficient documentation

## 2015-11-27 DIAGNOSIS — I252 Old myocardial infarction: Secondary | ICD-10-CM | POA: Insufficient documentation

## 2015-11-27 DIAGNOSIS — Z8673 Personal history of transient ischemic attack (TIA), and cerebral infarction without residual deficits: Secondary | ICD-10-CM | POA: Insufficient documentation

## 2015-11-27 DIAGNOSIS — J45909 Unspecified asthma, uncomplicated: Secondary | ICD-10-CM | POA: Insufficient documentation

## 2015-11-27 DIAGNOSIS — R079 Chest pain, unspecified: Secondary | ICD-10-CM | POA: Insufficient documentation

## 2015-11-27 DIAGNOSIS — E1140 Type 2 diabetes mellitus with diabetic neuropathy, unspecified: Secondary | ICD-10-CM | POA: Insufficient documentation

## 2015-11-27 DIAGNOSIS — Z79899 Other long term (current) drug therapy: Secondary | ICD-10-CM | POA: Insufficient documentation

## 2015-11-27 DIAGNOSIS — R000 Tachycardia, unspecified: Secondary | ICD-10-CM | POA: Insufficient documentation

## 2015-11-27 LAB — CBC
HCT: 27.6 % — ABNORMAL LOW (ref 36.0–46.0)
Hemoglobin: 8.6 g/dL — ABNORMAL LOW (ref 12.0–15.0)
MCH: 19.7 pg — AB (ref 26.0–34.0)
MCHC: 31.2 g/dL (ref 30.0–36.0)
MCV: 63.2 fL — AB (ref 78.0–100.0)
Platelets: 625 10*3/uL — ABNORMAL HIGH (ref 150–400)
RBC: 4.37 MIL/uL (ref 3.87–5.11)
RDW: 18.2 % — AB (ref 11.5–15.5)
WBC: 17.2 10*3/uL — ABNORMAL HIGH (ref 4.0–10.5)

## 2015-11-27 LAB — RAPID URINE DRUG SCREEN, HOSP PERFORMED
Amphetamines: NOT DETECTED
BARBITURATES: NOT DETECTED
BENZODIAZEPINES: NOT DETECTED
COCAINE: NOT DETECTED
Opiates: NOT DETECTED
Tetrahydrocannabinol: NOT DETECTED

## 2015-11-27 LAB — URINALYSIS, ROUTINE W REFLEX MICROSCOPIC
Bilirubin Urine: NEGATIVE
GLUCOSE, UA: NEGATIVE mg/dL
HGB URINE DIPSTICK: NEGATIVE
KETONES UR: NEGATIVE mg/dL
LEUKOCYTES UA: NEGATIVE
Nitrite: NEGATIVE
PROTEIN: NEGATIVE mg/dL
Specific Gravity, Urine: 1.009 (ref 1.005–1.030)
pH: 7.5 (ref 5.0–8.0)

## 2015-11-27 LAB — I-STAT TROPONIN, ED
TROPONIN I, POC: 0 ng/mL (ref 0.00–0.08)
Troponin i, poc: 0 ng/mL (ref 0.00–0.08)

## 2015-11-27 LAB — BASIC METABOLIC PANEL
Anion gap: 9 (ref 5–15)
BUN: 10 mg/dL (ref 6–20)
CHLORIDE: 102 mmol/L (ref 101–111)
CO2: 24 mmol/L (ref 22–32)
CREATININE: 0.79 mg/dL (ref 0.44–1.00)
Calcium: 9.8 mg/dL (ref 8.9–10.3)
GFR calc Af Amer: >60 mL/min (ref >60)
GFR calc non Af Amer: >60 mL/min (ref >60)
GLUCOSE: 145 mg/dL — AB (ref 65–99)
Potassium: 3.5 mmol/L (ref 3.5–5.1)
SODIUM: 135 mmol/L (ref 135–145)

## 2015-11-27 LAB — ETHANOL

## 2015-11-27 LAB — LIPASE, BLOOD: LIPASE: 32 U/L (ref 11–51)

## 2015-11-27 LAB — BRAIN NATRIURETIC PEPTIDE: B Natriuretic Peptide: 110.9 pg/mL — ABNORMAL HIGH (ref 0.0–100.0)

## 2015-11-27 LAB — POC URINE PREG, ED: PREG TEST UR: NEGATIVE

## 2015-11-27 MED ORDER — DIPHENHYDRAMINE HCL 50 MG/ML IJ SOLN
12.5000 mg | Freq: Once | INTRAMUSCULAR | Status: AC
  Administered 2015-11-27: 12.5 mg via INTRAVENOUS
  Filled 2015-11-27: qty 1

## 2015-11-27 MED ORDER — METOCLOPRAMIDE HCL 10 MG PO TABS: 10.0000 mg | ORAL_TABLET | Freq: Four times a day (QID) | ORAL | 0 refills | Status: DC

## 2015-11-27 MED ORDER — SODIUM CHLORIDE 0.9 % IV BOLUS (SEPSIS)
1000.0000 mL | Freq: Once | INTRAVENOUS | Status: AC
  Administered 2015-11-27: 1000 mL via INTRAVENOUS

## 2015-11-27 MED ORDER — IOPAMIDOL (ISOVUE-300) INJECTION 61%
INTRAVENOUS | Status: AC
  Administered 2015-11-27: 100 mL
  Filled 2015-11-27: qty 100

## 2015-11-27 MED ORDER — HYDROMORPHONE HCL 1 MG/ML IJ SOLN
1.0000 mg | Freq: Once | INTRAMUSCULAR | Status: AC
  Administered 2015-11-27: 1 mg via INTRAVENOUS
  Filled 2015-11-27: qty 1

## 2015-11-27 MED ORDER — PROMETHAZINE HCL 25 MG/ML IJ SOLN
25.0000 mg | Freq: Once | INTRAMUSCULAR | Status: AC
  Administered 2015-11-27: 25 mg via INTRAVENOUS
  Filled 2015-11-27: qty 1

## 2015-11-27 MED ORDER — METOCLOPRAMIDE HCL 5 MG/ML IJ SOLN
10.0000 mg | Freq: Once | INTRAMUSCULAR | Status: AC
  Administered 2015-11-27: 10 mg via INTRAVENOUS
  Filled 2015-11-27: qty 2

## 2015-11-27 MED ORDER — ONDANSETRON 4 MG PO TBDP
ORAL_TABLET | ORAL | Status: DC
Start: 2015-11-27 — End: 2015-11-27
  Filled 2015-11-27: qty 1

## 2015-11-27 MED ORDER — ONDANSETRON 4 MG PO TBDP
4.0000 mg | ORAL_TABLET | Freq: Once | ORAL | Status: AC
Start: 2015-11-27 — End: 2015-11-27
  Administered 2015-11-27: 4 mg via ORAL

## 2015-11-27 NOTE — ED Provider Notes (Signed)
Care assumed from [facility], PA-C at end of shift. Shirley [name] is an 40 y.o. female with history of diabetes with gastroparesis, CAD, MI, depression who presented to the ED with complaints of LLQ abdominal pain tha started last night with 10 episodes of NBNB emesis. She also reported chest pain that began at the same time as her abdominal pain. She has admitted to being noncompliant with home meds due to lack of insurance. Workup to this point is unrevealing. Low suspicion for ACS, PE, or other emergent cardiopulmonary etiology. CT abd/pelvis and repeat troponin are pending. If both are negative and vitals improve and are stable we will plan to discharge home. In the ED she has been tachycardic in the low 100s with intermittent spikes. This appears to be her baseline.  Physical Exam  BP (!) 179/105   Pulse 107   Temp 99.8 F (37.7 C) (Rectal)   Resp (!) 8   Ht 5\' 7"  (1.702 m)   Wt 91.2 kg   LMP 11/15/2015   SpO2 97%   BMI 31.48 kg/m   Physical Exam  Constitutional: She is oriented to person, place, and time. No distress.  Obese, NAD  HENT:  Head: Atraumatic.  Right Ear: External ear normal.  Left Ear: External ear normal.  Nose: Nose normal.  Eyes: Conjunctivae are normal. No scleral icterus.  Cardiovascular: Normal rate and regular rhythm.   Pulmonary/Chest: Effort normal. No respiratory distress.  Abdominal: Soft. She exhibits no distension. There is no tenderness.  Abdomen soft, nondistended, nontender  Neurological: She is alert and oriented to person, place, and time.  Skin: Skin is warm and dry. She is not diaphoretic.  Psychiatric: She has a normal mood and affect. Her behavior is normal.  Nursing note and vitals reviewed.     Vitals:   11/27/15 0300 11/27/15 0330 11/27/15 0400 11/27/15 0600  BP: 145/84 142/91 141/98 (!) 179/105  Pulse: (!) 134 109 108 107  Resp: 21 15 16  (!) 8  Temp:      TempSrc:      SpO2: 100% 97% 99% 97%  Weight:      Height:          ED Course  Procedures   Results for orders placed or performed during the hospital encounter of 99991111  Basic metabolic panel  Result Value Ref Range   Sodium 135 135 - 145 mmol/L   Potassium 3.5 3.5 - 5.1 mmol/L   Chloride 102 101 - 111 mmol/L   CO2 24 22 - 32 mmol/L   Glucose, Bld 145 (H) 65 - 99 mg/dL   BUN 10 6 - 20 mg/dL   Creatinine, Ser 0.79 0.44 - 1.00 mg/dL   Calcium 9.8 8.9 - 10.3 mg/dL   GFR calc non Af Amer >60 >60 mL/min   GFR calc Af Amer >60 >60 mL/min   Anion gap 9 5 - 15  CBC  Result Value Ref Range   WBC 17.2 (H) 4.0 - 10.5 K/uL   RBC 4.37 3.87 - 5.11 MIL/uL   Hemoglobin 8.6 (L) 12.0 - 15.0 g/dL   HCT 27.6 (L) 36.0 - 46.0 %   MCV 63.2 (L) 78.0 - 100.0 fL   MCH 19.7 (L) 26.0 - 34.0 pg   MCHC 31.2 30.0 - 36.0 g/dL   RDW 18.2 (H) 11.5 - 15.5 %   Platelets 625 (H) 150 - 400 K/uL  Lipase, blood  Result Value Ref Range   Lipase 32 11 - 51 U/L  Urinalysis, Routine w reflex microscopic (not at [facility])  Result Value Ref Range   Color, Urine YELLOW YELLOW   APPearance CLEAR CLEAR   Specific Gravity, Urine 1.009 1.005 - 1.030   pH 7.5 5.0 - 8.0   Glucose, UA NEGATIVE NEGATIVE mg/dL   Hgb urine dipstick NEGATIVE NEGATIVE   Bilirubin Urine NEGATIVE NEGATIVE   Ketones, ur NEGATIVE NEGATIVE mg/dL   Protein, ur NEGATIVE NEGATIVE mg/dL   Nitrite NEGATIVE NEGATIVE   Leukocytes, UA NEGATIVE NEGATIVE  Rapid urine drug screen (hospital performed)  Result Value Ref Range   Opiates NONE DETECTED NONE DETECTED   Cocaine NONE DETECTED NONE DETECTED   Benzodiazepines NONE DETECTED NONE DETECTED   Amphetamines NONE DETECTED NONE DETECTED   Tetrahydrocannabinol NONE DETECTED NONE DETECTED   Barbiturates NONE DETECTED NONE DETECTED  Ethanol  Result Value Ref Range   Alcohol, Ethyl (B) <5 <5 mg/dL  Brain natriuretic peptide  Result Value Ref Range   B Natriuretic Peptide 110.9 (H) 0.0 - 100.0 pg/mL  I-stat troponin, ED  Result Value Ref Range   Troponin i, poc  0.00 0.00 - 0.08 ng/mL   Comment 3          POC Urine Pregnancy, ED (do NOT order at [facility])  Result Value Ref Range   Preg Test, Ur NEGATIVE NEGATIVE  I-stat troponin, ED  Result Value Ref Range   Troponin i, poc 0.00 0.00 - 0.08 ng/mL   Comment 3           Dg Chest 2 View  Result Date: 11/27/2015 CLINICAL DATA:  Chest pain and vomiting for 3 days. EXAM: CHEST  2 VIEW COMPARISON:  10/13/2014 FINDINGS: The cardiomediastinal contours are normal. The lungs are clear. Pulmonary vasculature is normal. No consolidation, pleural effusion, or pneumothorax. No acute osseous abnormalities are seen. IMPRESSION: No acute pulmonary process. Electronically Signed   By: Jeb Levering M.D.   On: 11/27/2015 01:37   Ct Abdomen Pelvis W Contrast  Result Date: 11/27/2015 CLINICAL DATA:  40 year old female with history of thrombocytosis presenting with abdominal pain and guarding. History of pyelonephritis hepatitis B and gastritis. EXAM: CT ABDOMEN AND PELVIS WITH CONTRAST TECHNIQUE: Multidetector CT imaging of the abdomen and pelvis was performed using the standard protocol following bolus administration of intravenous contrast. CONTRAST:  146mL ISOVUE-300 IOPAMIDOL (ISOVUE-300) INJECTION 61% COMPARISON:  CT dated 03/30/2015 FINDINGS: The visualized lung bases are clear. No intra-abdominal free air or free fluid. The liver, gallbladder, pancreas, spleen, adrenal glands, kidneys, visualized ureters, and urinary bladder appear unremarkable. The uterus is anteverted. A posterior uterine fibroid noted with apparent indentation of the endometrial canal. Ultrasound may provide better evaluation of the pelvic structures. A 2.5 cm right ovarian dominant follicle or corpus luteum. There is moderate stool throughout the colon. No evidence of bowel obstruction or active inflammation. Normal appendix. The abdominal aorta and IVC appear unremarkable. No portal venous gas identified. The SMV, splenic vein, and main portal vein are  patent. There is no adenopathy. There is mild diffuse subcutaneous soft tissue stranding. No fluid collection. There is mild degenerative changes of the spine. L5-S1 disc desiccation with vacuum phenomena and endplate sclerosis. No acute fracture. IMPRESSION: No acute intra-abdominal or pelvic pathology. Electronically Signed   By: Anner Crete M.D.   On: 11/27/2015 06:24      MDM CT abd pelvis negative. Repeat troponin negative. HR remaining in low 100s. BP is elevated as pt has not been taking any medications but no evidence of end  organ damage. Suspect gastroparesis. Pt's nausea has returned. Repeat abdominal exam benign. Chest pain resolved. Doubt emergent cardiopulmonary etiology. Will give another dose of reglan and PO challenge.   Pt tolerating PO. Will d/c home with rx for reglan and close outpatient f/u. ER return precautions given.   Vitals:   11/27/15 0400 11/27/15 0600 11/27/15 0800 11/27/15 0958  BP: 141/98 (!) 179/105 158/90 (!) 147/112  Pulse: 108 107 110 93  Resp: 16 (!) 8  18  Temp:    98 F (36.7 C)  TempSrc:      SpO2: 99% 97% 100% 100%  Weight:      Height:          Anne Ng, PA-C 11/27/15 1604

## 2015-11-27 NOTE — ED Provider Notes (Signed)
[facility] DEPT Provider Note   CSN: [id number] Arrival date & time: 11/27/15  [id number]  First Provider Contact:  First MD Initiated Contact with Patient 11/27/15 [phone]        History   Chief Complaint Chief Complaint  Patient presents with  . Chest Pain  . Abdominal Pain  . Nausea  . Emesis    HPI Shirley [name] is a 40 y.o. female.  Shirley [name] is a 40 y.o. female  with a hx of diabetes with neuropathy and gastroparesis, CAD and MI s/p stent placement in 2008, depression, anxiety  presents to the Emergency Department complaining of gradual, persistent, progressively worsening LLQ abd pain onset 10pm tonight. Associated symptoms include nausea and vomiting x10 episodes.  NBNB emesis.  Denies fever, chills, diarrhea.  Pt reports pain is sharp and stabbing, rated at a 10/10 and the same as previous episodes.  No treatments PTA.  Shaking in bed makes it better and nothing makes it worse.    Pt also c/o chest pain, described as sharp and rated at a 10/10.  She reports it began at the same time her abd pain started.  She reports 4-5 days of symmetric bilateral lower leg swelling.  Pt reports she is not taking any of her medications since her last hospitalization as she has no insurance.       The history is provided by the patient and medical records. No language interpreter was used.    Past Medical History:  Diagnosis Date  . Abscess of tunica vaginalis    10/09- Abundant S. aureus- sensitive to all abx  . Anxiety   . Blood dyscrasia   . CAD (coronary artery disease) 06/15/2006   s/p Subendocardial MI with PDA angioplasty(no stent) on 06/15/06 and relook  cath 06/19/06 showed patency of site. Cath 12/10- no restenosis or significant CAD progression  . CVA (cerebral vascular accident) ([place]) ~ 02/2014   denies residual on 04/22/2014  . CVA (cerebral vascular accident) [facility])    history of remote right cerebellar infarct noted on head CT at least since 10/2011  . Depression     . Diabetes mellitus type 2, uncontrolled, with complications ([place])   . Fibromyalgia   . Gastritis   . Gastroparesis    secondary to poorly controlled DM, last emptying study performed 01/2010  was normal but may be falsely positive as pt was on reglan  . GERD (gastroesophageal reflux disease)   . Hepatitis B, chronic (HCC)    Hep BeAb+,Hep B cAb+ & Hep BsAg+ (9/06)  . History of pyelonephritis    H/o GrpB Pyelonephritis (9/06) and UTI- 07/11- E.Coli, 12/10- GBS  . Hyperlipidemia   . Hypertension   . Iron deficiency anemia   . Irregular menses    Small ovarian follicles seen on 99991111)  . MI (myocardial infarction) ([place]) 05/2006   PDA percutaneous transluminal coronary angioplasty  . Migraine    "weekly" (04/22/2014)  . N&V (nausea and vomiting)    Chronic. Unclear etiology with multiple admission and ED visits. CT abdomen with and without contrast (02/2011)  showed no acute process. Gastic Emptying scan (01/2010) was normal. Ultrasound of the abdomen was within normal limits. Hepatitis B viral load was undectable. HIV NR. EGD - gastritis, Hpylori + s/p Rx  . Obesity   . OSA (obstructive sleep apnea)    "suppose to wear mask but I don't" (04/22/2014)  . Peripheral neuropathy ([place])   . Pneumonia    "this is probably the  2nd or 3rd time I've had pneumonia" (04/22/2014)  . Recurrent boils   . Seasonal asthma   . Substance abuse   . Thrombocytosis ([place])    Hem/Onc suggested 2/2 chronic hepatits and/or iron deficiency anemia    Patient Active Problem List   Diagnosis Date Noted  . Non-intractable cyclical vomiting with nausea   . Dehydration 03/27/2015  . Intractable nausea and vomiting 11/11/2014  . Gastroparesis 11/11/2014  . Cough   . Hematemesis 10/13/2014  . DKA (diabetic ketoacidoses) ([place]) 10/13/2014  . Increased anion gap metabolic acidosis 0000000  . Tachycardia 07/21/2014  . Abdominal pain, chronic, left lower quadrant   . Nausea with vomiting   . Diabetic  gastroparesis associated with type 2 diabetes mellitus ([place]) 04/28/2014  . History of Helicobacter pylori infection 04/22/2014  . Vaginal discharge 02/18/2014  . Atypical chest pain 07/22/2013  . Unspecified constipation 07/21/2013  . Tinea corporis 07/21/2013  . Intractable vomiting 04/29/2013  . Dysmenorrhea 04/22/2013  . Headache(784.0) 02/08/2012  . Health care maintenance 01/22/2012  . Chronic hepatitis B ([place]) 03/07/2011  . History of leukocytosis 04/06/2010  . THROMBOCYTOSIS 04/06/2010  . Polysubstance abuse 02/23/2010  . Iron deficiency anemia 11/22/2009  . PERIPHERAL NEUROPATHY 10/01/2009  . Hyperlipidemia 08/30/2009  . POLYNEUROPATHY, DIABETIC 08/30/2009  . Hidradenitis (recurrent boils) 07/07/2008  . Depression 12/27/2007  . Abdominal pain, left lower quadrant 11/21/2007  . FIBROMYALGIA 10/30/2007  . BACK PAIN 04/01/2007  . OBSTRUCTIVE SLEEP APNEA 01/17/2007  . ANXIETY DEPRESSION 06/27/2006  . Chronic ischemic heart disease 06/15/2006  . OBESITY, MORBID 05/15/2006  . MIGRAINE HEADACHE 05/15/2006  . Asthma 05/15/2006  . Essential hypertension 01/16/2006  . IRREGULAR MENSTRUATION 01/16/2006  . PEDAL EDEMA 01/16/2006  . Poorly controlled type 2 diabetes mellitus with peripheral neuropathy ([place]) 01/16/1989    Past Surgical History:  Procedure Laterality Date  . CESAREAN SECTION  1997  . CORONARY ANGIOPLASTY WITH STENT PLACEMENT  2008   "2 stents"  . ESOPHAGOGASTRODUODENOSCOPY N/A 04/23/2014   Procedure: ESOPHAGOGASTRODUODENOSCOPY (EGD);  Surgeon: Winfield Cunas., MD;  Location[facility] ENDOSCOPY;  Service: Endoscopy;  Laterality: N/A;    OB History    No data available       Home Medications    Prior to Admission medications   Medication Sig Start Date End Date Taking? Authorizing Provider  albuterol (PROVENTIL HFA;VENTOLIN HFA) 108 (90 BASE) MCG/ACT inhaler Inhale 2 puffs into the lungs every 4 (four) hours as needed for wheezing or shortness of breath.  04/26/14   Florinda Marker, MD  atorvastatin (LIPITOR) 80 MG tablet Take 1 tablet (80 mg total) by mouth daily at 6 PM. Patient not taking: Reported on 11/03/2015 04/02/15   Maryellen Pile, MD  cephALEXin (KEFLEX) 500 MG capsule Take 1 capsule (500 mg total) by mouth 2 (two) times daily. 11/03/15   Frederica Kuster, PA-C  diclofenac sodium (VOLTAREN) 1 % GEL Apply 2 g topically 4 (four) times daily. Patient not taking: Reported on 11/03/2015 04/17/15   Helane Gunther Joy, PA-C  doxycycline (VIBRAMYCIN) 100 MG capsule Take 1 capsule (100 mg total) by mouth 2 (two) times daily. 11/03/15   Frederica Kuster, PA-C  ferrous sulfate 325 (65 FE) MG tablet Take 1 tablet (325 mg total) by mouth daily with breakfast. Patient not taking: Reported on 11/03/2015 04/02/15   Maryellen Pile, MD  Insulin Glargine (LANTUS SOLOSTAR) 100 UNIT/ML Solostar Pen Inject 20 Units into the skin daily at 10 pm. 04/02/15   Maryellen Pile, MD  Insulin Pen Needle 31G X 5 MM MISC Inject 20 units of Lantus at night before bed 04/02/15   Maryellen Pile, MD  labetalol (NORMODYNE) 100 MG tablet Take 1 tablet (100 mg total) by mouth 2 (two) times daily. Patient not taking: Reported on 11/03/2015 04/02/15   Maryellen Pile, MD  omeprazole (PRILOSEC) 40 MG capsule Take 1 capsule (40 mg total) by mouth daily. Patient not taking: Reported on 11/03/2015 04/02/15   Maryellen Pile, MD    Family History Family History  Problem Relation Age of Onset  . Diabetes Father     Social History Social History  Substance Use Topics  . Smoking status: Former Smoker    Types: Cigarettes    Quit date: 04/24/1996  . Smokeless tobacco: Never Used     Comment: quit smoking cigarettes age 61  . Alcohol use 0.0 oz/week     Comment: 04/22/2014 "might have a few drinks a month"     Allergies   Eggs or egg-derived products; Lisinopril; and Morphine and related   Review of Systems Review of Systems  Cardiovascular: Positive for chest pain and leg swelling.    Gastrointestinal: Positive for abdominal pain, nausea and vomiting. Negative for diarrhea.  All other systems reviewed and are negative.    Physical Exam Updated Vital Signs BP (!) 179/105   Pulse 107   Temp 99.8 F (37.7 C) (Rectal)   Resp (!) 8   Ht 5\' 7"  (1.702 m)   Wt 91.2 kg   LMP 11/15/2015   SpO2 97%   BMI 31.48 kg/m   Physical Exam  Constitutional: She appears well-developed and well-nourished. No distress.  Awake, alert, nontoxic appearance  HENT:  Head: Normocephalic and atraumatic.  Mouth/Throat: Oropharynx is clear and moist. No oropharyngeal exudate.  Eyes: Conjunctivae are normal. No scleral icterus.  Neck: Normal range of motion. Neck supple.  Cardiovascular: Regular rhythm and intact distal pulses.  Tachycardia present.   Pulses:      Radial pulses are 2+ on the right side, and 2+ on the left side.       Dorsalis pedis pulses are 2+ on the right side, and 2+ on the left side.  Pulmonary/Chest: Effort normal and breath sounds normal. No respiratory distress. She has no wheezes.  Equal chest expansion  Abdominal: Soft. Bowel sounds are normal. She exhibits no distension and no mass. There is tenderness in the periumbilical area, suprapubic area and left lower quadrant. There is guarding. There is no rigidity, no rebound and no CVA tenderness.  Musculoskeletal: Normal range of motion. She exhibits edema. Tenderness: 1+ pitting edema to the feet and ankles.  Neurological: She is alert.  Speech is clear and goal oriented Moves extremities without ataxia  Skin: Skin is warm and dry. She is not diaphoretic.  Psychiatric: Her mood appears anxious. She is agitated.  Pt anxious, agitated and crying  Nursing note and vitals reviewed.    ED Treatments / Results  Labs (all labs ordered are listed, but only abnormal results are displayed) Labs Reviewed  BASIC METABOLIC PANEL - Abnormal; Notable for the following:       Result Value   Glucose, Bld 145 (*)    All  other components within normal limits  CBC - Abnormal; Notable for the following:    WBC 17.2 (*)    Hemoglobin 8.6 (*)    HCT 27.6 (*)    MCV 63.2 (*)    MCH 19.7 (*)    RDW 18.2 (*)  Platelets 625 (*)    All other components within normal limits  BRAIN NATRIURETIC PEPTIDE - Abnormal; Notable for the following:    B Natriuretic Peptide 110.9 (*)    All other components within normal limits  LIPASE, BLOOD  URINALYSIS, ROUTINE W REFLEX MICROSCOPIC (NOT AT [facility])  URINE RAPID DRUG SCREEN, HOSP PERFORMED  ETHANOL  I-STAT TROPOININ, ED  POC URINE PREG, ED  I-STAT TROPOININ, ED    EKG  EKG Interpretation  Date/Time:  Saturday November 27 2015 00:53:44 EDT Ventricular Rate:  124 PR Interval:  122 QRS Duration: 72 QT Interval:  310 QTC Calculation: 445 R Axis:   74 Text Interpretation:  Sinus tachycardia with occasional Premature ventricular complexes Otherwise normal ECG No acute changes No significant change since last tracing Confirmed by Kathrynn Humble, MD, Thelma Comp [phone]) on 11/27/2015 5:06:31 AM       Radiology Dg Chest 2 View  Result Date: 11/27/2015 CLINICAL DATA:  Chest pain and vomiting for 3 days. EXAM: CHEST  2 VIEW COMPARISON:  10/13/2014 FINDINGS: The cardiomediastinal contours are normal. The lungs are clear. Pulmonary vasculature is normal. No consolidation, pleural effusion, or pneumothorax. No acute osseous abnormalities are seen. IMPRESSION: No acute pulmonary process. Electronically Signed   By: Jeb Levering M.D.   On: 11/27/2015 01:37    Procedures Procedures (including critical care time)  Medications Ordered in ED Medications  ondansetron (ZOFRAN-ODT) disintegrating tablet 4 mg (4 mg Oral Given 11/27/15 0106)  sodium chloride 0.9 % bolus 1,000 mL (0 mLs Intravenous Stopped 11/27/15 0430)  metoCLOPramide (REGLAN) injection 10 mg (10 mg Intravenous Given 11/27/15 0255)  diphenhydrAMINE (BENADRYL) injection 12.5 mg (12.5 mg Intravenous Given 11/27/15 0255)    HYDROmorphone (DILAUDID) injection 1 mg (1 mg Intravenous Given 11/27/15 0255)  HYDROmorphone (DILAUDID) injection 1 mg (1 mg Intravenous Given 11/27/15 0434)  iopamidol (ISOVUE-300) 61 % injection (100 mLs  Contrast Given 11/27/15 0542)     Initial Impression / Assessment and Plan / ED Course  I have reviewed the triage vital signs and the nursing notes.  Pertinent labs & imaging results that were available during my care of the patient were reviewed by me and considered in my medical decision making (see chart for details).  Clinical Course  Value Comment By Time  Glucose: (!) 145 Slightly elevated; no elevation in [employer], PA-C 08/05 [id number]  Troponin i, poc: 0.00 WNL Abigail Butts, PA-C 08/05 0212  WBC: (!) 17.2 Leukocytosis; higher than previous Abigail Butts, PA-C 08/05 0212  Hemoglobin: (!) 8.6 Anemia at baseline [facility], PA-C 08/05 0213  Nitrite: NEGATIVE No UTI Abigail Butts, PA-C 08/05 [id number]  Pulse Rate: (!) 127 Tachycardic, hypertensive.  Pt with previously elevated BPs while in the ED [facility], PA-C 08/05 [id number]   Last CT abd 03/30/15 without acute findings.   Jarrett Soho Arlissa Monteverde, PA-C 08/05 0242   Pt remains tender with guarding after pain control.  Will CT.  Abigail Butts, PA-C 08/05 [phone]   CT scan and ultrasound and pending. At shift change care transferred to [facility], PA-C. Abigail Butts, PA-C 08/05 [phone]    Patient with abdominal pain and chest pain. She is a history of MI however less likely due to resuscitation. Initial troponin negative. EKG unchanged from previous. No evidence of pneumonia.   The troponin negative. CT scan pending at shift change. Patient remained somewhat tachycardic however review shows that she is always somewhat tachycardic. Highly doubt PE at this time.   Final Clinical Impressions(s) / ED  Diagnoses   Final diagnoses:  Left lower quadrant pain  Chest pain, unspecified chest pain type   Non-intractable vomiting with nausea, vomiting of unspecified type    New Prescriptions New Prescriptions   No medications on file     Abigail Butts, PA-C 11/27/15 [place], MD 11/27/15 [phone]

## 2015-11-27 NOTE — ED Notes (Signed)
Family at bedside. 

## 2015-11-27 NOTE — ED Notes (Signed)
The patient is tolerating Ginger Ale.

## 2015-11-27 NOTE — Discharge Instructions (Signed)
Take Reglan as needed for nausea/vomiting. Please follow up with the [facility] to establish primary care and get assistance restarting your medications.

## 2015-11-27 NOTE — ED Triage Notes (Signed)
Pt c/o generalized chest pain, shortness of breath, abdominal pain, n/v starting about a hour ago. Pt states she has vomited x 5

## 2015-11-27 NOTE — ED Notes (Signed)
Patient is alert and orientedx4.  Patient was explained discharge instructions and they understood them with no questions.  D.Azia, her daughter, is taking her home.

## 2015-11-28 ENCOUNTER — Encounter (HOSPITAL_COMMUNITY): Payer: Self-pay | Admitting: *Deleted

## 2015-11-28 ENCOUNTER — Observation Stay (HOSPITAL_COMMUNITY)
Admission: EM | Admit: 2015-11-28 | Discharge: 2015-11-29 | Disposition: A | Discharge status: Home / Self Care | Payer: Medicaid Other | Attending: Internal Medicine | Admitting: Internal Medicine

## 2015-11-28 DIAGNOSIS — I10 Essential (primary) hypertension: Secondary | ICD-10-CM | POA: Diagnosis present

## 2015-11-28 DIAGNOSIS — I2510 Atherosclerotic heart disease of native coronary artery without angina pectoris: Secondary | ICD-10-CM | POA: Insufficient documentation

## 2015-11-28 DIAGNOSIS — R600 Localized edema: Secondary | ICD-10-CM

## 2015-11-28 DIAGNOSIS — Z794 Long term (current) use of insulin: Secondary | ICD-10-CM | POA: Insufficient documentation

## 2015-11-28 DIAGNOSIS — E785 Hyperlipidemia, unspecified: Secondary | ICD-10-CM | POA: Diagnosis present

## 2015-11-28 DIAGNOSIS — G4733 Obstructive sleep apnea (adult) (pediatric): Secondary | ICD-10-CM | POA: Insufficient documentation

## 2015-11-28 DIAGNOSIS — K3184 Gastroparesis: Secondary | ICD-10-CM | POA: Diagnosis present

## 2015-11-28 DIAGNOSIS — R1115 Cyclical vomiting syndrome unrelated to migraine: Secondary | ICD-10-CM

## 2015-11-28 DIAGNOSIS — E119 Type 2 diabetes mellitus without complications: Secondary | ICD-10-CM | POA: Diagnosis present

## 2015-11-28 DIAGNOSIS — Z8673 Personal history of transient ischemic attack (TIA), and cerebral infarction without residual deficits: Secondary | ICD-10-CM

## 2015-11-28 DIAGNOSIS — E1142 Type 2 diabetes mellitus with diabetic polyneuropathy: Secondary | ICD-10-CM | POA: Insufficient documentation

## 2015-11-28 DIAGNOSIS — Z6832 Body mass index (BMI) 32.0-32.9, adult: Secondary | ICD-10-CM | POA: Insufficient documentation

## 2015-11-28 DIAGNOSIS — I252 Old myocardial infarction: Secondary | ICD-10-CM | POA: Insufficient documentation

## 2015-11-28 DIAGNOSIS — K219 Gastro-esophageal reflux disease without esophagitis: Secondary | ICD-10-CM | POA: Insufficient documentation

## 2015-11-28 DIAGNOSIS — Z9114 Patient's other noncompliance with medication regimen: Secondary | ICD-10-CM | POA: Insufficient documentation

## 2015-11-28 DIAGNOSIS — F341 Dysthymic disorder: Secondary | ICD-10-CM | POA: Diagnosis present

## 2015-11-28 DIAGNOSIS — Z955 Presence of coronary angioplasty implant and graft: Secondary | ICD-10-CM | POA: Insufficient documentation

## 2015-11-28 DIAGNOSIS — B181 Chronic viral hepatitis B without delta-agent: Secondary | ICD-10-CM | POA: Diagnosis present

## 2015-11-28 DIAGNOSIS — Z87891 Personal history of nicotine dependence: Secondary | ICD-10-CM | POA: Insufficient documentation

## 2015-11-28 DIAGNOSIS — Z79899 Other long term (current) drug therapy: Secondary | ICD-10-CM | POA: Insufficient documentation

## 2015-11-28 DIAGNOSIS — E1165 Type 2 diabetes mellitus with hyperglycemia: Secondary | ICD-10-CM | POA: Insufficient documentation

## 2015-11-28 DIAGNOSIS — E1143 Type 2 diabetes mellitus with diabetic autonomic (poly)neuropathy: Principal | ICD-10-CM | POA: Insufficient documentation

## 2015-11-28 DIAGNOSIS — D509 Iron deficiency anemia, unspecified: Secondary | ICD-10-CM | POA: Diagnosis present

## 2015-11-28 LAB — COMPREHENSIVE METABOLIC PANEL
ALBUMIN: 3.2 g/dL — AB (ref 3.5–5.0)
ALT: 9 U/L — ABNORMAL LOW (ref 14–54)
AST: 18 U/L (ref 15–41)
Alkaline Phosphatase: 55 U/L (ref 38–126)
Anion gap: 8 (ref 5–15)
BUN: 15 mg/dL (ref 6–20)
CHLORIDE: 103 mmol/L (ref 101–111)
CO2: 24 mmol/L (ref 22–32)
Calcium: 9.5 mg/dL (ref 8.9–10.3)
Creatinine, Ser: 1.03 mg/dL — ABNORMAL HIGH (ref 0.44–1.00)
GFR calc Af Amer: >60 mL/min (ref >60)
GLUCOSE: 314 mg/dL — AB (ref 65–99)
POTASSIUM: 3.8 mmol/L (ref 3.5–5.1)
SODIUM: 135 mmol/L (ref 135–145)
Total Bilirubin: 0.4 mg/dL (ref 0.3–1.2)
Total Protein: 7.6 g/dL (ref 6.5–8.1)

## 2015-11-28 LAB — URINALYSIS, ROUTINE W REFLEX MICROSCOPIC
BILIRUBIN URINE: NEGATIVE
Glucose, UA: >1000 mg/dL — AB
Hgb urine dipstick: NEGATIVE
KETONES UR: NEGATIVE mg/dL
NITRITE: NEGATIVE
Protein, ur: NEGATIVE mg/dL
SPECIFIC GRAVITY, URINE: 1.03 (ref 1.005–1.030)
pH: 5.5 (ref 5.0–8.0)

## 2015-11-28 LAB — CBC WITH DIFFERENTIAL/PLATELET
BASOS PCT: 1 %
Basophils Absolute: 0.1 10*3/uL (ref 0.0–0.1)
EOS PCT: 2 %
Eosinophils Absolute: 0.3 10*3/uL (ref 0.0–0.7)
HCT: 25.9 % — ABNORMAL LOW (ref 36.0–46.0)
Hemoglobin: 8 g/dL — ABNORMAL LOW (ref 12.0–15.0)
Lymphocytes Relative: 18 %
Lymphs Abs: 2.4 10*3/uL (ref 0.7–4.0)
MCH: 19.4 pg — ABNORMAL LOW (ref 26.0–34.0)
MCHC: 30.9 g/dL (ref 30.0–36.0)
MCV: 62.9 fL — ABNORMAL LOW (ref 78.0–100.0)
MONO ABS: 0.8 10*3/uL (ref 0.1–1.0)
MONOS PCT: 6 %
NEUTROS PCT: 73 %
Neutro Abs: 10 10*3/uL — ABNORMAL HIGH (ref 1.7–7.7)
PLATELETS: 522 10*3/uL — AB (ref 150–400)
RBC: 4.12 MIL/uL (ref 3.87–5.11)
RDW: 18 % — AB (ref 11.5–15.5)
WBC: 13.6 10*3/uL — AB (ref 4.0–10.5)

## 2015-11-28 LAB — URINE MICROSCOPIC-ADD ON
RBC / HPF: NONE SEEN RBC/hpf (ref 0–5)
WBC UA: NONE SEEN WBC/hpf (ref 0–5)

## 2015-11-28 LAB — GLUCOSE, CAPILLARY: Glucose-Capillary: 168 mg/dL — ABNORMAL HIGH (ref 65–99)

## 2015-11-28 LAB — I-STAT BETA HCG BLOOD, ED (MC, WL, AP ONLY): I-stat hCG, quantitative: <5 m[IU]/mL (ref <5)

## 2015-11-28 LAB — LIPASE, BLOOD: LIPASE: 33 U/L (ref 11–51)

## 2015-11-28 MED ORDER — INSULIN ASPART 100 UNIT/ML ~~LOC~~ SOLN: 0.0000 [IU] | Freq: Every day | SUBCUTANEOUS | Status: DC

## 2015-11-28 MED ORDER — ATORVASTATIN CALCIUM 80 MG PO TABS: 80.0000 mg | ORAL_TABLET | Freq: Every day | ORAL | Status: DC

## 2015-11-28 MED ORDER — ENOXAPARIN SODIUM 40 MG/0.4ML ~~LOC~~ SOLN
40.0000 mg | SUBCUTANEOUS | Status: DC
  Administered 2015-11-29: 40 mg via SUBCUTANEOUS
  Filled 2015-11-28: qty 0.4

## 2015-11-28 MED ORDER — HYDROMORPHONE HCL 1 MG/ML IJ SOLN
1.0000 mg | Freq: Once | INTRAMUSCULAR | Status: AC
  Administered 2015-11-28: 1 mg via INTRAVENOUS
  Filled 2015-11-28: qty 1

## 2015-11-28 MED ORDER — ACETAMINOPHEN 325 MG PO TABS: 650.0000 mg | ORAL_TABLET | Freq: Four times a day (QID) | ORAL | Status: DC | PRN

## 2015-11-28 MED ORDER — FERROUS SULFATE 325 (65 FE) MG PO TABS
325.0000 mg | ORAL_TABLET | Freq: Every day | ORAL | Status: DC
  Administered 2015-11-29: 325 mg via ORAL
  Filled 2015-11-28: qty 1

## 2015-11-28 MED ORDER — PROMETHAZINE HCL 25 MG/ML IJ SOLN
12.5000 mg | Freq: Once | INTRAMUSCULAR | Status: AC
  Administered 2015-11-28: 12.5 mg via INTRAVENOUS
  Filled 2015-11-28: qty 1

## 2015-11-28 MED ORDER — ALBUTEROL SULFATE (2.5 MG/3ML) 0.083% IN NEBU: 3.0000 mL | INHALATION_SOLUTION | RESPIRATORY_TRACT | Status: DC | PRN

## 2015-11-28 MED ORDER — METOCLOPRAMIDE HCL 10 MG PO TABS
10.0000 mg | ORAL_TABLET | Freq: Three times a day (TID) | ORAL | Status: DC
  Administered 2015-11-29 (×4): 10 mg via ORAL
  Filled 2015-11-28 (×4): qty 1

## 2015-11-28 MED ORDER — GI COCKTAIL ~~LOC~~: 30.0000 mL | Freq: Two times a day (BID) | ORAL | Status: DC | PRN

## 2015-11-28 MED ORDER — ACETAMINOPHEN 650 MG RE SUPP: 650.0000 mg | Freq: Four times a day (QID) | RECTAL | Status: DC | PRN

## 2015-11-28 MED ORDER — LOSARTAN POTASSIUM 50 MG PO TABS
25.0000 mg | ORAL_TABLET | Freq: Every day | ORAL | Status: DC
  Administered 2015-11-29: 25 mg via ORAL
  Filled 2015-11-28: qty 1

## 2015-11-28 MED ORDER — INSULIN ASPART 100 UNIT/ML ~~LOC~~ SOLN
0.0000 [IU] | Freq: Three times a day (TID) | SUBCUTANEOUS | Status: DC
  Administered 2015-11-29: 2 [IU] via SUBCUTANEOUS
  Administered 2015-11-29: 3 [IU] via SUBCUTANEOUS

## 2015-11-28 MED ORDER — SODIUM CHLORIDE 0.9 % IV BOLUS (SEPSIS)
1000.0000 mL | Freq: Once | INTRAVENOUS | Status: AC
  Administered 2015-11-28: 1000 mL via INTRAVENOUS

## 2015-11-28 MED ORDER — PANTOPRAZOLE SODIUM 40 MG PO TBEC
80.0000 mg | DELAYED_RELEASE_TABLET | Freq: Every day | ORAL | Status: DC
  Administered 2015-11-29: 80 mg via ORAL
  Filled 2015-11-28: qty 2

## 2015-11-28 MED ORDER — INSULIN GLARGINE 100 UNIT/ML ~~LOC~~ SOLN
20.0000 [IU] | Freq: Every day | SUBCUTANEOUS | Status: DC
  Administered 2015-11-29: 20 [IU] via SUBCUTANEOUS
  Filled 2015-11-28 (×2): qty 0.2

## 2015-11-28 MED ORDER — PROMETHAZINE HCL 25 MG/ML IJ SOLN
12.5000 mg | Freq: Four times a day (QID) | INTRAMUSCULAR | Status: DC | PRN
  Administered 2015-11-29: 12.5 mg via INTRAVENOUS
  Filled 2015-11-28: qty 1

## 2015-11-28 MED ORDER — SODIUM CHLORIDE 0.9 % IV SOLN
INTRAVENOUS | Status: AC
  Administered 2015-11-29: via INTRAVENOUS

## 2015-11-28 NOTE — ED Provider Notes (Signed)
[facility] DEPT Provider Note   CSN: [id number] Arrival date & time: 11/28/15  2002  First Provider Contact:  None       History   Chief Complaint Chief Complaint  Patient presents with  . Abdominal Pain    HPI Shirley [name] is a 40 y.o. female.  Patient is a 40 year old female with history of cyclic vomiting. She presents with complaints of abdominal cramping, nausea, and vomiting that has been ongoing for the past 2 days. All of this is been nonbloody and nonbilious. She denies any diarrhea and denies any fever. She was seen here last night and was treated and ultimately discharged. She returns this evening stating that her symptoms have since returned.   The history is provided by the patient.  Abdominal Pain   This is a recurrent problem. The problem occurs constantly. The problem has been gradually worsening. The pain is moderate.    Past Medical History:  Diagnosis Date  . Abscess of tunica vaginalis    10/09- Abundant S. aureus- sensitive to all abx  . Anxiety   . Blood dyscrasia   . CAD (coronary artery disease) 06/15/2006   s/p Subendocardial MI with PDA angioplasty(no stent) on 06/15/06 and relook  cath 06/19/06 showed patency of site. Cath 12/10- no restenosis or significant CAD progression  . CVA (cerebral vascular accident) ([place]) ~ 02/2014   denies residual on 04/22/2014  . CVA (cerebral vascular accident) [facility])    history of remote right cerebellar infarct noted on head CT at least since 10/2011  . Depression   . Diabetes mellitus type 2, uncontrolled, with complications ([place])   . Fibromyalgia   . Gastritis   . Gastroparesis    secondary to poorly controlled DM, last emptying study performed 01/2010  was normal but may be falsely positive as pt was on reglan  . GERD (gastroesophageal reflux disease)   . Hepatitis B, chronic (HCC)    Hep BeAb+,Hep B cAb+ & Hep BsAg+ (9/06)  . History of pyelonephritis    H/o GrpB Pyelonephritis (9/06) and UTI- 07/11-  E.Coli, 12/10- GBS  . Hyperlipidemia   . Hypertension   . Iron deficiency anemia   . Irregular menses    Small ovarian follicles seen on 99991111)  . MI (myocardial infarction) ([place]) 05/2006   PDA percutaneous transluminal coronary angioplasty  . Migraine    "weekly" (04/22/2014)  . N&V (nausea and vomiting)    Chronic. Unclear etiology with multiple admission and ED visits. CT abdomen with and without contrast (02/2011)  showed no acute process. Gastic Emptying scan (01/2010) was normal. Ultrasound of the abdomen was within normal limits. Hepatitis B viral load was undectable. HIV NR. EGD - gastritis, Hpylori + s/p Rx  . Obesity   . OSA (obstructive sleep apnea)    "suppose to wear mask but I don't" (04/22/2014)  . Peripheral neuropathy ([place])   . Pneumonia    "this is probably the 2nd or 3rd time I've had pneumonia" (04/22/2014)  . Recurrent boils   . Seasonal asthma   . Substance abuse   . Thrombocytosis ([place])    Hem/Onc suggested 2/2 chronic hepatits and/or iron deficiency anemia    Patient Active Problem List   Diagnosis Date Noted  . Non-intractable cyclical vomiting with nausea   . Dehydration 03/27/2015  . Intractable nausea and vomiting 11/11/2014  . Gastroparesis 11/11/2014  . Cough   . Hematemesis 10/13/2014  . DKA (diabetic ketoacidoses) ([place]) 10/13/2014  . Increased anion gap metabolic  acidosis 10/13/2014  . Tachycardia 07/21/2014  . Abdominal pain, chronic, left lower quadrant   . Nausea with vomiting   . Diabetic gastroparesis associated with type 2 diabetes mellitus ([place]) 04/28/2014  . History of Helicobacter pylori infection 04/22/2014  . Vaginal discharge 02/18/2014  . Atypical chest pain 07/22/2013  . Unspecified constipation 07/21/2013  . Tinea corporis 07/21/2013  . Intractable vomiting 04/29/2013  . Dysmenorrhea 04/22/2013  . Headache(784.0) 02/08/2012  . Health care maintenance 01/22/2012  . Chronic hepatitis B ([place]) 03/07/2011  . History of  leukocytosis 04/06/2010  . THROMBOCYTOSIS 04/06/2010  . Polysubstance abuse 02/23/2010  . Iron deficiency anemia 11/22/2009  . PERIPHERAL NEUROPATHY 10/01/2009  . Hyperlipidemia 08/30/2009  . POLYNEUROPATHY, DIABETIC 08/30/2009  . Hidradenitis (recurrent boils) 07/07/2008  . Depression 12/27/2007  . Abdominal pain, left lower quadrant 11/21/2007  . FIBROMYALGIA 10/30/2007  . BACK PAIN 04/01/2007  . OBSTRUCTIVE SLEEP APNEA 01/17/2007  . ANXIETY DEPRESSION 06/27/2006  . Chronic ischemic heart disease 06/15/2006  . OBESITY, MORBID 05/15/2006  . MIGRAINE HEADACHE 05/15/2006  . Asthma 05/15/2006  . Essential hypertension 01/16/2006  . IRREGULAR MENSTRUATION 01/16/2006  . PEDAL EDEMA 01/16/2006  . Poorly controlled type 2 diabetes mellitus with peripheral neuropathy ([place]) 01/16/1989    Past Surgical History:  Procedure Laterality Date  . CESAREAN SECTION  1997  . CORONARY ANGIOPLASTY WITH STENT PLACEMENT  2008   "2 stents"  . ESOPHAGOGASTRODUODENOSCOPY N/A 04/23/2014   Procedure: ESOPHAGOGASTRODUODENOSCOPY (EGD);  Surgeon: Winfield Cunas., MD;  Location[facility] ENDOSCOPY;  Service: Endoscopy;  Laterality: N/A;    OB History    No data available       Home Medications    Prior to Admission medications   Medication Sig Start Date End Date Taking? Authorizing Provider  albuterol (PROVENTIL HFA;VENTOLIN HFA) 108 (90 BASE) MCG/ACT inhaler Inhale 2 puffs into the lungs every 4 (four) hours as needed for wheezing or shortness of breath. 04/26/14   Florinda Marker, MD  atorvastatin (LIPITOR) 80 MG tablet Take 1 tablet (80 mg total) by mouth daily at 6 PM. Patient not taking: Reported on 11/03/2015 04/02/15   Maryellen Pile, MD  cephALEXin (KEFLEX) 500 MG capsule Take 1 capsule (500 mg total) by mouth 2 (two) times daily. 11/03/15   Frederica Kuster, PA-C  diclofenac sodium (VOLTAREN) 1 % GEL Apply 2 g topically 4 (four) times daily. Patient not taking: Reported on 11/03/2015 04/17/15   Helane Gunther Joy, PA-C  doxycycline (VIBRAMYCIN) 100 MG capsule Take 1 capsule (100 mg total) by mouth 2 (two) times daily. 11/03/15   Frederica Kuster, PA-C  ferrous sulfate 325 (65 FE) MG tablet Take 1 tablet (325 mg total) by mouth daily with breakfast. Patient not taking: Reported on 11/03/2015 04/02/15   Maryellen Pile, MD  Insulin Glargine (LANTUS SOLOSTAR) 100 UNIT/ML Solostar Pen Inject 20 Units into the skin daily at 10 pm. 04/02/15   Maryellen Pile, MD  Insulin Pen Needle 31G X 5 MM MISC Inject 20 units of Lantus at night before bed 04/02/15   Maryellen Pile, MD  labetalol (NORMODYNE) 100 MG tablet Take 1 tablet (100 mg total) by mouth 2 (two) times daily. Patient not taking: Reported on 11/03/2015 04/02/15   Maryellen Pile, MD  metoCLOPramide (REGLAN) 10 MG tablet Take 1 tablet (10 mg total) by mouth every 6 (six) hours. 11/27/15   Olivia Canter Sam, PA-C  omeprazole (PRILOSEC) 40 MG capsule Take 1 capsule (40 mg total) by mouth daily. Patient not  taking: Reported on 11/03/2015 04/02/15   Maryellen Pile, MD    Family History Family History  Problem Relation Age of Onset  . Diabetes Father     Social History Social History  Substance Use Topics  . Smoking status: Former Smoker    Types: Cigarettes    Quit date: 04/24/1996  . Smokeless tobacco: Never Used     Comment: quit smoking cigarettes age 18  . Alcohol use 0.0 oz/week     Comment: 04/22/2014 "might have a few drinks a month"     Allergies   Eggs or egg-derived products; Lisinopril; and Morphine and related   Review of Systems Review of Systems  Gastrointestinal: Positive for abdominal pain.  All other systems reviewed and are negative.    Physical Exam Updated Vital Signs BP 158/100 (BP Location: Left Arm)   Pulse (!) 134   Temp 98.6 F (37 C) (Oral)   Resp 22   Wt 201 lb (91.2 kg)   LMP 11/15/2015   SpO2 100%   BMI 31.48 kg/m   Physical Exam  Constitutional: She is oriented to person, place, and time. She appears  well-developed and well-nourished. No distress.  HENT:  Head: Normocephalic and atraumatic.  Neck: Normal range of motion. Neck supple.  Cardiovascular: Normal rate and regular rhythm.  Exam reveals no gallop and no friction rub.   No murmur heard. Pulmonary/Chest: Effort normal and breath sounds normal. No respiratory distress. She has no wheezes.  Abdominal: Soft. Bowel sounds are normal. She exhibits no distension. There is no tenderness.  Musculoskeletal: Normal range of motion.  Neurological: She is alert and oriented to person, place, and time.  Skin: Skin is warm and dry. She is not diaphoretic.  Nursing note and vitals reviewed.    ED Treatments / Results  Labs (all labs ordered are listed, but only abnormal results are displayed) Labs Reviewed  CBC WITH DIFFERENTIAL/PLATELET  COMPREHENSIVE METABOLIC PANEL  LIPASE, BLOOD  URINALYSIS, ROUTINE W REFLEX MICROSCOPIC (NOT AT [facility])  I-STAT BETA HCG BLOOD, ED ([facility], [facility], [facility] ONLY)    EKG  EKG Interpretation None       Radiology Dg Chest 2 View  Result Date: 11/27/2015 CLINICAL DATA:  Chest pain and vomiting for 3 days. EXAM: CHEST  2 VIEW COMPARISON:  10/13/2014 FINDINGS: The cardiomediastinal contours are normal. The lungs are clear. Pulmonary vasculature is normal. No consolidation, pleural effusion, or pneumothorax. No acute osseous abnormalities are seen. IMPRESSION: No acute pulmonary process. Electronically Signed   By: Jeb Levering M.D.   On: 11/27/2015 01:37   Ct Abdomen Pelvis W Contrast  Result Date: 11/27/2015 CLINICAL DATA:  40 year old female with history of thrombocytosis presenting with abdominal pain and guarding. History of pyelonephritis hepatitis B and gastritis. EXAM: CT ABDOMEN AND PELVIS WITH CONTRAST TECHNIQUE: Multidetector CT imaging of the abdomen and pelvis was performed using the standard protocol following bolus administration of intravenous contrast. CONTRAST:  168mL ISOVUE-300 IOPAMIDOL  (ISOVUE-300) INJECTION 61% COMPARISON:  CT dated 03/30/2015 FINDINGS: The visualized lung bases are clear. No intra-abdominal free air or free fluid. The liver, gallbladder, pancreas, spleen, adrenal glands, kidneys, visualized ureters, and urinary bladder appear unremarkable. The uterus is anteverted. A posterior uterine fibroid noted with apparent indentation of the endometrial canal. Ultrasound may provide better evaluation of the pelvic structures. A 2.5 cm right ovarian dominant follicle or corpus luteum. There is moderate stool throughout the colon. No evidence of bowel obstruction or active inflammation. Normal appendix. The abdominal aorta and  IVC appear unremarkable. No portal venous gas identified. The SMV, splenic vein, and main portal vein are patent. There is no adenopathy. There is mild diffuse subcutaneous soft tissue stranding. No fluid collection. There is mild degenerative changes of the spine. L5-S1 disc desiccation with vacuum phenomena and endplate sclerosis. No acute fracture. IMPRESSION: No acute intra-abdominal or pelvic pathology. Electronically Signed   By: Anner Crete M.D.   On: 11/27/2015 06:24    Procedures Procedures (including critical care time)  Medications Ordered in ED Medications  sodium chloride 0.9 % bolus 1,000 mL (not administered)  promethazine (PHENERGAN) injection 12.5 mg (not administered)  HYDROmorphone (DILAUDID) injection 1 mg (not administered)     Initial Impression / Assessment and Plan / ED Course  I have reviewed the triage vital signs and the nursing notes.  Pertinent labs & imaging results that were available during my care of the patient were reviewed by me and considered in my medical decision making (see chart for details).  Clinical Course      Final Clinical Impressions(s) / ED Diagnoses   Final diagnoses:  None   Patient with history Of cyclic vomiting. She was seen here yesterday evening for abdominal pain and vomiting.  She was treated and discharged. She returns tonight saying that her symptoms are no better. She appears very anxious and is writhing on the stretcher. She will be given pain and nausea medication along with IV fluids.  After the above interventions, she is not feeling any better. She will be admitted to the teaching service for hydration and anti-emetics.  New Prescriptions New Prescriptions   No medications on file     Veryl Speak, MD 11/29/15 [phone]

## 2015-11-28 NOTE — ED Triage Notes (Signed)
Patient presents with c/o abd pain (history of gastroparesis)  Seen last night for the same and no relief

## 2015-11-28 NOTE — H&P (Signed)
Date: 11/28/2015               Patient Name:  Shirley [name] MRN: [id number]  DOB: [birth year] Age / Sex: 40 y.o., female   PCP: Collier Salina, MD         Medical Service: Internal Medicine Teaching Service         Attending Physician: Dr. Bartholomew Crews, MD    First Contact: Dr. Holley Raring, MD Pager: [phone]  Second Contact: Dr. Charlott Rakes, MD Pager: [phone]       After Hours (After 5p/  First Contact Pager: [phone]  weekends / holidays): Second Contact Pager: [phone]   Chief Complaint: nausea, vomiting and abdominal pain.  History of Present Illness: This is a 40 year old female with uncontrolled type 2 DM, gastroparesis, chronic hepatitis B infection, MI and CVA without residual deficit who presents for evaluation of nausea and vomiting. The patient reports that 2 days ago she developed nausea with many episodes of vomiting and LLQ pain. She reports a history of gastroparesis and medicine non-compliance x6 months due to "not having insurance". She states this is what her gastroparesis flare-ups feel like and has dealt with gastroparesis and similar symptoms for many years. She reports she was seen yesterday in the ED and was given Reglan and Zofran but she could not keep it down but was given Phenergan which relieved her symptoms.  She also complains of a 1 week history of BL leg swelling. She states she had a similar problem "many years ago" and was treated with a water pill.  She reports a brief episode of chest pain earlier today which has resolved at this time. She denies any dysuria, shortness of breath, diarrhea, constipation, blood in stool, hematemesis or bile in emesis.   Meds:  Current Facility-Administered Medications on File Prior to Encounter  Medication Dose Route Frequency Provider Last Rate Last Dose  . promethazine (PHENERGAN) injection 25 mg  25 mg Intramuscular Once Evelina Bucy, MD       Current Outpatient Prescriptions on File Prior to  Encounter  Medication Sig Dispense Refill  . albuterol (PROVENTIL HFA;VENTOLIN HFA) 108 (90 BASE) MCG/ACT inhaler Inhale 2 puffs into the lungs every 4 (four) hours as needed for wheezing or shortness of breath. (Patient not taking: Reported on 11/28/2015) 1 Inhaler 4  . atorvastatin (LIPITOR) 80 MG tablet Take 1 tablet (80 mg total) by mouth daily at 6 PM. (Patient not taking: Reported on 11/03/2015) 30 tablet 0  . cephALEXin (KEFLEX) 500 MG capsule Take 1 capsule (500 mg total) by mouth 2 (two) times daily. (Patient not taking: Reported on 11/28/2015) 14 capsule 0  . diclofenac sodium (VOLTAREN) 1 % GEL Apply 2 g topically 4 (four) times daily. (Patient not taking: Reported on 11/03/2015) 100 g 0  . doxycycline (VIBRAMYCIN) 100 MG capsule Take 1 capsule (100 mg total) by mouth 2 (two) times daily. (Patient not taking: Reported on 11/28/2015) 28 capsule 0  . ferrous sulfate 325 (65 FE) MG tablet Take 1 tablet (325 mg total) by mouth daily with breakfast. (Patient not taking: Reported on 11/03/2015) 90 tablet 2  . Insulin Glargine (LANTUS SOLOSTAR) 100 UNIT/ML Solostar Pen Inject 20 Units into the skin daily at 10 pm. (Patient not taking: Reported on 11/28/2015) 15 mL 1  . Insulin Pen Needle 31G X 5 MM MISC Inject 20 units of Lantus at night before bed (Patient not taking: Reported on 11/28/2015) 1 each 0  .  labetalol (NORMODYNE) 100 MG tablet Take 1 tablet (100 mg total) by mouth 2 (two) times daily. (Patient not taking: Reported on 11/03/2015) 60 tablet 0  . metoCLOPramide (REGLAN) 10 MG tablet Take 1 tablet (10 mg total) by mouth every 6 (six) hours. (Patient not taking: Reported on 11/28/2015) 30 tablet 0  . omeprazole (PRILOSEC) 40 MG capsule Take 1 capsule (40 mg total) by mouth daily. (Patient not taking: Reported on 11/03/2015) 30 capsule 1    Allergies: Allergies as of 11/28/2015 - Review Complete 11/28/2015  Allergen Reaction Noted  . Eggs or egg-derived products Hives 03/27/2015  . Lisinopril Nausea  Only and Swelling 03/17/2008  . Morphine and related Itching and Swelling 04/15/2013   Past Medical History:  Diagnosis Date  . Abscess of tunica vaginalis    10/09- Abundant S. aureus- sensitive to all abx  . Anxiety   . Blood dyscrasia   . CAD (coronary artery disease) 06/15/2006   s/p Subendocardial MI with PDA angioplasty(no stent) on 06/15/06 and relook  cath 06/19/06 showed patency of site. Cath 12/10- no restenosis or significant CAD progression  . CVA (cerebral vascular accident) ([place]) ~ 02/2014   denies residual on 04/22/2014  . CVA (cerebral vascular accident) [facility])    history of remote right cerebellar infarct noted on head CT at least since 10/2011  . Depression   . Diabetes mellitus type 2, uncontrolled, with complications ([place])   . Fibromyalgia   . Gastritis   . Gastroparesis    secondary to poorly controlled DM, last emptying study performed 01/2010  was normal but may be falsely positive as pt was on reglan  . GERD (gastroesophageal reflux disease)   . Hepatitis B, chronic (HCC)    Hep BeAb+,Hep B cAb+ & Hep BsAg+ (9/06)  . History of pyelonephritis    H/o GrpB Pyelonephritis (9/06) and UTI- 07/11- E.Coli, 12/10- GBS  . Hyperlipidemia   . Hypertension   . Iron deficiency anemia   . Irregular menses    Small ovarian follicles seen on 99991111)  . MI (myocardial infarction) ([place]) 05/2006   PDA percutaneous transluminal coronary angioplasty  . Migraine    "weekly" (04/22/2014)  . N&V (nausea and vomiting)    Chronic. Unclear etiology with multiple admission and ED visits. CT abdomen with and without contrast (02/2011)  showed no acute process. Gastic Emptying scan (01/2010) was normal. Ultrasound of the abdomen was within normal limits. Hepatitis B viral load was undectable. HIV NR. EGD - gastritis, Hpylori + s/p Rx  . Obesity   . OSA (obstructive sleep apnea)    "suppose to wear mask but I don't" (04/22/2014)  . Peripheral neuropathy ([place])   . Pneumonia    "this is  probably the 2nd or 3rd time I've had pneumonia" (04/22/2014)  . Recurrent boils   . Seasonal asthma   . Substance abuse   . Thrombocytosis (HCC)    Hem/Onc suggested 2/2 chronic hepatits and/or iron deficiency anemia    Family History:  Father: Diabetes Denies any other Fhx  Social History: Former smoker, quit at age 83 Alcohol: occasional Drugs: Hx of cocaine and marijuana abuse, quit in 2013 Employment: Unemployed  Review of Systems: A complete ROS was negative except as per HPI.   Physical Exam: Blood pressure 137/79, pulse 103, temperature 98.6 F (37 C), temperature source Oral, resp. rate 14, weight 91.2 kg (201 lb), last menstrual period 11/15/2015, SpO2 98 %. General: alert, awake, obese female laying comfortably in bed on left side.  In no acute distress Cardio: Tachycardic, regular rhythm.  Pulmonary: CTA BL Abdomen: +bowel sounds, no guarding, rebound or rigidity. Mild tenderness in LLQ Extremities: 1+ BL LE edema, no erythema    CXR: from 11/27/15 shows no acute pulmonary process CT Abdomen/Pelvis w/ contrast from 11/27/15 showed no acute intra-abdominal or pelvic pathology  Assessment & Plan by Problem: Principal Problem:   Gastroparesis Active Problems:   Poorly controlled type 2 diabetes mellitus with peripheral neuropathy (HCC)   Hyperlipidemia   OBESITY, MORBID   ANXIETY DEPRESSION   Essential hypertension   Iron deficiency anemia   Chronic hepatitis B ([place])  1. Gastroparesis  Pt reports long history of gastroparesis with multiple similar episodes. Symptoms consistent with gastroparesis and are likely all related to her inability to afford her home medications despite numerous attempts at assistance. Patient offered gastric emptying study in the past but refused to proceed with procedure. Pt had EGD in 2015 that indicated likely gastroparesis as there was retained food in stomach and esophagus. CMP unremarkable with normal renal function. CBC shows a white  count of 13.6, down from 17.2 yesterday.  Lipase normal. Restarting patient on Reglan and home dose lantus 20 units +iss. Urine without [place], no anion gap on CMP. Patient given 2L ns in ED. IVF(ns) running at 65mL/hr for rehydration. Consulted case management re: patient unable to afford home medication.  2. T2DM, poorly controlled.  Checking HbA1c. Last HbA1c 11.7 (12/16). Insulin as above.  3. Iron Deficiency Anemia Hb 8 today, down from 8.6 yesterday, target cells present. This is near patients baseline of 8.5. This is likely due to pts long standing history of iron deficiency anemia. Pts home iron restarted. Last iron (12/16) 12 with a iron saturation of 4. Will monitor.   4. Lower Extremity Edema: Patient had ECHO done at [facility] which was normal. Will monitor. Denies any chest pain or shortness of breath. BNP 110.9 Troponin negative x2  5. HTN Labetalol on home med list but has not been taking for 6 months. Switched to Losartan given her DM. Has "nausea" with Lisinopril but will try this ARB.   Dispo: Admit patient to Observation with expected length of stay less than 2 midnights.  SignedEinar Gip, DO 11/28/2015, 10:30 PM  Pager: [phone]

## 2015-11-29 DIAGNOSIS — K3184 Gastroparesis: Secondary | ICD-10-CM

## 2015-11-29 DIAGNOSIS — E1143 Type 2 diabetes mellitus with diabetic autonomic (poly)neuropathy: Secondary | ICD-10-CM

## 2015-11-29 LAB — BASIC METABOLIC PANEL
ANION GAP: 6 (ref 5–15)
BUN: 12 mg/dL (ref 6–20)
CALCIUM: 8.6 mg/dL — AB (ref 8.9–10.3)
CO2: 25 mmol/L (ref 22–32)
Chloride: 107 mmol/L (ref 101–111)
Creatinine, Ser: 0.8 mg/dL (ref 0.44–1.00)
Glucose, Bld: 111 mg/dL — ABNORMAL HIGH (ref 65–99)
POTASSIUM: 3.8 mmol/L (ref 3.5–5.1)
Sodium: 138 mmol/L (ref 135–145)

## 2015-11-29 LAB — GLUCOSE, CAPILLARY
GLUCOSE-CAPILLARY: 130 mg/dL — AB (ref 65–99)
Glucose-Capillary: 190 mg/dL — ABNORMAL HIGH (ref 65–99)

## 2015-11-29 MED ORDER — ONDANSETRON HCL 4 MG PO TABS
4.0000 mg | ORAL_TABLET | Freq: Once | ORAL | Status: AC
  Administered 2015-11-29: 4 mg via ORAL
  Filled 2015-11-29: qty 1

## 2015-11-29 MED ORDER — KETOROLAC TROMETHAMINE 30 MG/ML IJ SOLN
30.0000 mg | Freq: Once | INTRAMUSCULAR | Status: AC
  Administered 2015-11-29: 30 mg via INTRAVENOUS
  Filled 2015-11-29: qty 1

## 2015-11-29 MED ORDER — ASPIRIN 81 MG PO TBEC: 81.0000 mg | DELAYED_RELEASE_TABLET | Freq: Every day | ORAL | 5 refills | Status: DC

## 2015-11-29 MED ORDER — KETOROLAC TROMETHAMINE 30 MG/ML IJ SOLN
INTRAMUSCULAR | Status: AC
Start: 2015-11-29 — End: 2015-11-29
  Filled 2015-11-29: qty 1

## 2015-11-29 MED ORDER — LOSARTAN POTASSIUM 25 MG PO TABS: 25.0000 mg | ORAL_TABLET | Freq: Every day | ORAL | 2 refills | Status: DC

## 2015-11-29 MED ORDER — ASPIRIN EC 81 MG PO TBEC
81.0000 mg | DELAYED_RELEASE_TABLET | Freq: Every day | ORAL | Status: DC
  Administered 2015-11-29: 81 mg via ORAL
  Filled 2015-11-29: qty 1

## 2015-11-29 NOTE — Care Management Note (Signed)
Case Management Note  Patient Details  Name: Shirley [name] MRN: [id number] Date of Birth: [birth year]  Subjective/Objective:                 Patient uninsured. PCP is Dr Benjamine Mola at [facility]. In obs for gastroparesis. Spoke to pharmacist in hallway, they may be able to provide medication assistance, orange card application information.   Action/Plan:  Anticipate DC to home with medication assistance as needed. Expected Discharge Date:                  Expected Discharge Plan:  Home/Self Care  In-House Referral:  NA  Discharge planning Services  CM Consult, Medication Assistance  Post Acute Care Choice:  NA Choice offered to:  NA  DME Arranged:  N/A DME Agency:  NA  HH Arranged:  NA HH Agency:  NA  Status of Service:  In process, will continue to follow  If discussed at Long Length of Stay Meetings, dates discussed:    Additional Comments:  Carles Collet, RN 11/29/2015, 12:40 PM

## 2015-11-29 NOTE — Discharge Summary (Signed)
Name: Shirley [name] MRN: [id number] DOB: 11-Jun-1975 40 y.o. PCP: Collier Salina, MD  Date of Admission: 11/28/2015  8:16 PM Date of Discharge: 11/29/2015 Attending Physician: Bartholomew Crews, MD  Discharge Diagnosis: Principal Problem:   Gastroparesis Active Problems:   Poorly controlled type 2 diabetes mellitus with peripheral neuropathy (HCC)   Hyperlipidemia   OBESITY, MORBID   ANXIETY DEPRESSION   Essential hypertension   Iron deficiency anemia   Chronic hepatitis B ([place])  Discharge Medications:   Medication List    STOP taking these medications   cephALEXin 500 MG capsule Commonly known as:  KEFLEX   diclofenac sodium 1 % Gel Commonly known as:  VOLTAREN   doxycycline 100 MG capsule Commonly known as:  VIBRAMYCIN   labetalol 100 MG tablet Commonly known as:  NORMODYNE     TAKE these medications   albuterol 108 (90 Base) MCG/ACT inhaler Commonly known as:  PROVENTIL HFA;VENTOLIN HFA Inhale 2 puffs into the lungs every 4 (four) hours as needed for wheezing or shortness of breath.   aspirin 81 MG EC tablet Take 1 tablet (81 mg total) by mouth daily.   atorvastatin 80 MG tablet Commonly known as:  LIPITOR Take 1 tablet (80 mg total) by mouth daily at 6 PM.   ferrous sulfate 325 (65 FE) MG tablet Take 1 tablet (325 mg total) by mouth daily with breakfast.   Insulin Glargine 100 UNIT/ML Solostar Pen Commonly known as:  LANTUS SOLOSTAR Inject 20 Units into the skin daily at 10 pm.   Insulin Pen Needle 31G X 5 MM Misc Inject 20 units of Lantus at night before bed   losartan 25 MG tablet Commonly known as:  COZAAR Take 1 tablet (25 mg total) by mouth daily.   metoCLOPramide 10 MG tablet Commonly known as:  REGLAN Take 1 tablet (10 mg total) by mouth every 6 (six) hours.   omeprazole 40 MG capsule Commonly known as:  PRILOSEC Take 1 capsule (40 mg total) by mouth daily.      Disposition and follow-up:   [name] was discharged  from [facility] in Good condition.  At the hospital follow up visit please address:  1.  Gastroparesis: f/u symptoms and nutrition HTN: recheck BP, medications, check BMP for renal function on ARB DM: f/u BG, reassess appropriate home regimen w/ Lantus 20U qHS CAD: consider adding BB after pt adjusts to ARB for ppx Anemia: symptoms?, tolerating iron supplementation, recheck CBC Medication Access: has she been able to obtain access to medications  2.  Labs / imaging needed at time of follow-up: BMP, CBC  3.  Pending labs/ test needing follow-up: None  Follow-up Appointments: Follow-up Information    [facility] Go on 12/06/2015.   Why:  follow up with our clinic at 10:45 AM Contact information: [address] [place] [facility] Course by problem list: Principal Problem:   Gastroparesis Active Problems:   Poorly controlled type 2 diabetes mellitus with peripheral neuropathy (HCC)   Hyperlipidemia   OBESITY, MORBID   ANXIETY DEPRESSION   Essential hypertension   Iron deficiency anemia   Chronic hepatitis B ([place])   1. Pt was admitted for concern of N/V and LLQ abdominal pain. She was found to be non-compliant with her home medications for her gastroparesis 2/2 inability to afford medicines. CT scan of abd/pelvis showed no signs of intraabdominal process. She  was also found to be anemic 2/2 iron deficiency w/o compliance to iron supplementation therapy. She was given her home medications and began to feel better. She was able to tolerate a full breakfast on the day of discharge, but still complained of some nausea. Case Management was consulted to help identify a way to procure the pts medications in the outpt setting.  Discharge Vitals:   BP 133/75 (BP Location: Left Arm)   Pulse 88   Temp 98.5 F (36.9 C) (Oral)   Resp 18   Ht 5' 8.4" (1.737 m)   Wt 215 lb 3.2 oz (97.6 kg)   LMP  11/15/2015   SpO2 97%   BMI 32.34 kg/m   Procedures Performed:  Ct Abdomen Pelvis W Contrast Result Date: 11/27/2015 CLINICAL DATA:  41 year old female with history of thrombocytosis presenting with abdominal pain and guarding. History of pyelonephritis hepatitis B and gastritis. EXAM: CT ABDOMEN AND PELVIS WITH CONTRAST TECHNIQUE: Multidetector CT imaging of the abdomen and pelvis was performed using the standard protocol following bolus administration of intravenous contrast. CONTRAST:  123mL ISOVUE-300 IOPAMIDOL (ISOVUE-300) INJECTION 61% COMPARISON:  CT dated 03/30/2015 FINDINGS: The visualized lung bases are clear. No intra-abdominal free air or free fluid. The liver, gallbladder, pancreas, spleen, adrenal glands, kidneys, visualized ureters, and urinary bladder appear unremarkable. The uterus is anteverted. A posterior uterine fibroid noted with apparent indentation of the endometrial canal. Ultrasound may provide better evaluation of the pelvic structures. A 2.5 cm right ovarian dominant follicle or corpus luteum. There is moderate stool throughout the colon. No evidence of bowel obstruction or active inflammation. Normal appendix. The abdominal aorta and IVC appear unremarkable. No portal venous gas identified. The SMV, splenic vein, and main portal vein are patent. There is no adenopathy. There is mild diffuse subcutaneous soft tissue stranding. No fluid collection. There is mild degenerative changes of the spine. L5-S1 disc desiccation with vacuum phenomena and endplate sclerosis. No acute fracture. IMPRESSION: No acute intra-abdominal or pelvic pathology. Electronically Signed   By: Anner Crete M.D.   On: 11/27/2015 06:24   Discharge Instructions: Discharge Instructions    Call MD for:  difficulty breathing, headache or visual disturbances    Complete by:  As directed   Call MD for:  persistant nausea and vomiting    Complete by:  As directed   Call MD for:  temperature >100.4    Complete  by:  As directed   Diet - low sodium heart healthy    Complete by:  As directed   Discharge instructions    Complete by:  As directed   Please follow up with your primary doctor once you leave the hospital.  The best way to prevent your symptoms from returning, it to take your medications. Please follow the instructions from the Case Manager in order to facilitate getting your medications affordably.   Increase activity slowly    Complete by:  As directed     Signed: Holley Raring, MD 11/29/2015, 11:27 AM   Pager: [phone]

## 2015-11-29 NOTE — Progress Notes (Signed)
Subjective: Currently, the patient is feeling okay w/ some small complaints of nausea, no vomiting. She was able to tolerate breakfast well.  Objective: Vital signs in last 24 hours: Vitals:   11/28/15 2215 11/28/15 2245 11/28/15 2328 11/29/15 0606  BP: 137/79 132/76  133/75  Pulse: 103 108  88  Resp: 14 19 18 18   Temp:   98.8 F (37.1 C) 98.5 F (36.9 C)  TempSrc:   Oral Oral  SpO2: 98% 99% 100% 97%  Weight:   215 lb 3.2 oz (97.6 kg)   Height:   5' 8.4" (1.737 m)    Intake/Output:  08/06 0701 - 08/07 0700 In: -  Out: 600 [Urine:600]    Physical Exam: Physical Exam  Constitutional: She appears well-developed. She is cooperative. No distress.  Cardiovascular: Normal rate, regular rhythm, normal heart sounds and normal pulses.  Exam reveals no gallop.   No murmur heard. Pulmonary/Chest: Effort normal and breath sounds normal. No respiratory distress. She has no wheezes. She has no rhonchi. She has no rales. Breasts are symmetrical.  Abdominal: Soft. Bowel sounds are normal. There is no tenderness.  Musculoskeletal: She exhibits no edema.   Labs: CBC:  Recent Labs Lab 11/27/15 0105 11/28/15 2020  WBC 17.2* 13.6*  NEUTROABS  --  10.0*  HGB 8.6* 8.0*  HCT 27.6* 25.9*  MCV 63.2* 62.9*  PLT 99991111* [id number]*   Metabolic Panel:  Recent Labs Lab 11/27/15 0105 11/28/15 2020 11/29/15 0431  NA 135 135 138  K 3.5 3.8 3.8  CL 102 103 107  CO2 24 24 25   GLUCOSE 145* 314* 111*  BUN 10 15 12   CREATININE 0.79 1.03* 0.80  CALCIUM 9.8 9.5 8.6*  ALT  --  9*  --   ALKPHOS  --  55  --   BILITOT  --  0.4  --   PROT  --  7.6  --   ALBUMIN  --  3.2*  --   LIPASE 32 33  --    Cardiac Labs:  Recent Labs Lab 11/27/15 0105 11/27/15 0117 11/27/15 0634  TROPIPOC  --  0.00 0.00  BNP 110.9*  --   --    BG:  Recent Labs Lab 11/28/15 2338 11/29/15 0806  GLUCAP 168* 130*   Lab Results  Component Value Date   HGBA1C 11.7 (H) 03/27/2015     Medications: Scheduled  Medications: . aspirin EC  81 mg Oral Daily  . atorvastatin  80 mg Oral q1800  . enoxaparin (LOVENOX) injection  40 mg Subcutaneous Q24H  . ferrous sulfate  325 mg Oral Q breakfast  . insulin aspart  0-15 Units Subcutaneous TID WC  . insulin aspart  0-5 Units Subcutaneous QHS  . insulin glargine  20 Units Subcutaneous QHS  . losartan  25 mg Oral Daily  . metoCLOPramide  10 mg Oral TID WC & HS  . pantoprazole  80 mg Oral Daily   PRN Medications: acetaminophen **OR** acetaminophen, albuterol, gi cocktail, promethazine  Assessment/Plan: Pt is a 40 y.o. yo female with a PMHx of DM, HTN, Gastroparesis, h/o MI, h/o CVA who was admitted on 11/28/2015 with symptoms of N/V, which was determined to be secondary to gastroparesis.  1. Gastroparesis  Pt reports long history of gastroparesis with multiple similar episodes. Symptoms consistent with gastroparesis and are likely all related to her inability to afford her home medications despite numerous attempts at assistance. Patient offered gastric emptying study in the past but refused to proceed with procedure.  Pt had EGD in 2015 that indicated likely gastroparesis as there was retained food in stomach and esophagus. - cont Reglan and home dose lantus 20 units + SSI. - d/c IVF 2/2 improved PO intake - Case management c/s re: patient unable to afford home medication.  2. T2DM, poorly controlled.  Checking HbA1c. Last HbA1c 11.7 (12/16). Insulin as above.  3. Iron Deficiency Anemia Hb 8 today, down from 8.6 yesterday, target cells present. This is near patients baseline of 8.5. Longstanding h/o iron deficiency anemia. Last iron (12/16) 12 with a iron saturation of 4. - cont home Fe supplementation  4. Lower Extremity Edema: Patient had ECHO done at [facility] which was normal. Denies any chest pain or shortness of breath. Likely 2/2 venous insufficiency. - elevate feet - consider BP management w/ HCTZ  5. HTN: BP stable. Started on  Losartan for DM, consider restarting BB for h/o MI as outpt  Length of Stay: 0 day(s) Dispo: Anticipated discharge today.  Holley Raring, MD Pager: [phone] (7AM-5PM) 11/29/2015, 11:46 AM

## 2015-11-29 NOTE — Progress Notes (Signed)
Nsg Discharge Note  Admit Date:  11/28/2015 Discharge date: 11/29/2015   Shirley [name] to be D/C'd Home per MD order.  AVS completed.  Copy for chart, and copy for patient signed, and dated. Patient/caregiver able to verbalize understanding.  Discharge Medication:   Medication List    STOP taking these medications   cephALEXin 500 MG capsule Commonly known as:  KEFLEX   diclofenac sodium 1 % Gel Commonly known as:  VOLTAREN   doxycycline 100 MG capsule Commonly known as:  VIBRAMYCIN   labetalol 100 MG tablet Commonly known as:  NORMODYNE     TAKE these medications   albuterol 108 (90 Base) MCG/ACT inhaler Commonly known as:  PROVENTIL HFA;VENTOLIN HFA Inhale 2 puffs into the lungs every 4 (four) hours as needed for wheezing or shortness of breath.   aspirin 81 MG EC tablet Take 1 tablet (81 mg total) by mouth daily.   atorvastatin 80 MG tablet Commonly known as:  LIPITOR Take 1 tablet (80 mg total) by mouth daily at 6 PM.   ferrous sulfate 325 (65 FE) MG tablet Take 1 tablet (325 mg total) by mouth daily with breakfast.   Insulin Glargine 100 UNIT/ML Solostar Pen Commonly known as:  LANTUS SOLOSTAR Inject 20 Units into the skin daily at 10 pm.   Insulin Pen Needle 31G X 5 MM Misc Inject 20 units of Lantus at night before bed   losartan 25 MG tablet Commonly known as:  COZAAR Take 1 tablet (25 mg total) by mouth daily.   metoCLOPramide 10 MG tablet Commonly known as:  REGLAN Take 1 tablet (10 mg total) by mouth every 6 (six) hours.   omeprazole 40 MG capsule Commonly known as:  PRILOSEC Take 1 capsule (40 mg total) by mouth daily.       Discharge Assessment: Vitals:   11/29/15 0606 11/29/15 1403  BP: 133/75 (!) 171/107  Pulse: 88 (!) 108  Resp: 18 17  Temp: 98.5 F (36.9 C) 98.9 F (37.2 C)   Skin clean, dry and intact without evidence of skin break down, no evidence of skin tears noted. IV catheter discontinued intact. Site without signs and  symptoms of complications - no redness or edema noted at insertion site, patient denies c/o pain - only slight tenderness at site.  Dressing with slight pressure applied.  D/c Instructions-Education: Discharge instructions given to patient/family with verbalized understanding. D/c education completed with patient/family including follow up instructions, medication list, d/c activities limitations if indicated, with other d/c instructions as indicated by MD - patient able to verbalize understanding, all questions fully answered. Patient instructed to return to ED, call 911, or call MD for any changes in condition.  Patient escorted via [place], and D/C home via private auto.  Dayle Points, RN 11/29/2015 5:33 PM

## 2015-11-30 LAB — HEMOGLOBIN A1C
Hgb A1c MFr Bld: 8.2 % — ABNORMAL HIGH (ref 4.8–5.6)
MEAN PLASMA GLUCOSE: 189 mg/dL

## 2015-12-05 ENCOUNTER — Emergency Department (HOSPITAL_COMMUNITY)
Admission: EM | Admit: 2015-12-05 | Discharge: 2015-12-05 | Disposition: A | Discharge status: Home / Self Care | Payer: Medicaid Other | Attending: Emergency Medicine | Admitting: Emergency Medicine

## 2015-12-05 ENCOUNTER — Encounter (HOSPITAL_COMMUNITY): Payer: Self-pay | Admitting: Emergency Medicine

## 2015-12-05 DIAGNOSIS — Y999 Unspecified external cause status: Secondary | ICD-10-CM | POA: Insufficient documentation

## 2015-12-05 DIAGNOSIS — Z794 Long term (current) use of insulin: Secondary | ICD-10-CM | POA: Insufficient documentation

## 2015-12-05 DIAGNOSIS — S0502XA Injury of conjunctiva and corneal abrasion without foreign body, left eye, initial encounter: Secondary | ICD-10-CM | POA: Insufficient documentation

## 2015-12-05 DIAGNOSIS — Z87891 Personal history of nicotine dependence: Secondary | ICD-10-CM | POA: Insufficient documentation

## 2015-12-05 DIAGNOSIS — Z7982 Long term (current) use of aspirin: Secondary | ICD-10-CM | POA: Insufficient documentation

## 2015-12-05 DIAGNOSIS — I252 Old myocardial infarction: Secondary | ICD-10-CM | POA: Insufficient documentation

## 2015-12-05 DIAGNOSIS — E1140 Type 2 diabetes mellitus with diabetic neuropathy, unspecified: Secondary | ICD-10-CM | POA: Insufficient documentation

## 2015-12-05 DIAGNOSIS — Z955 Presence of coronary angioplasty implant and graft: Secondary | ICD-10-CM | POA: Insufficient documentation

## 2015-12-05 DIAGNOSIS — E1143 Type 2 diabetes mellitus with diabetic autonomic (poly)neuropathy: Secondary | ICD-10-CM | POA: Insufficient documentation

## 2015-12-05 DIAGNOSIS — I2510 Atherosclerotic heart disease of native coronary artery without angina pectoris: Secondary | ICD-10-CM | POA: Insufficient documentation

## 2015-12-05 DIAGNOSIS — I10 Essential (primary) hypertension: Secondary | ICD-10-CM | POA: Insufficient documentation

## 2015-12-05 DIAGNOSIS — Y929 Unspecified place or not applicable: Secondary | ICD-10-CM | POA: Insufficient documentation

## 2015-12-05 DIAGNOSIS — Y939 Activity, unspecified: Secondary | ICD-10-CM | POA: Insufficient documentation

## 2015-12-05 DIAGNOSIS — Z8673 Personal history of transient ischemic attack (TIA), and cerebral infarction without residual deficits: Secondary | ICD-10-CM | POA: Insufficient documentation

## 2015-12-05 DIAGNOSIS — X58XXXA Exposure to other specified factors, initial encounter: Secondary | ICD-10-CM | POA: Insufficient documentation

## 2015-12-05 DIAGNOSIS — J45909 Unspecified asthma, uncomplicated: Secondary | ICD-10-CM | POA: Insufficient documentation

## 2015-12-05 MED ORDER — SULFACETAMIDE SODIUM 10 % OP SOLN
1.0000 [drp] | OPHTHALMIC | Status: DC
  Filled 2015-12-05: qty 15

## 2015-12-05 MED ORDER — IBUPROFEN 400 MG PO TABS
800.0000 mg | ORAL_TABLET | Freq: Once | ORAL | Status: AC
  Administered 2015-12-05: 800 mg via ORAL
  Filled 2015-12-05: qty 2

## 2015-12-05 MED ORDER — HYDROCODONE-ACETAMINOPHEN 5-325 MG PO TABS: 1.0000 | ORAL_TABLET | Freq: Four times a day (QID) | ORAL | 0 refills | Status: DC | PRN

## 2015-12-05 MED ORDER — TETRACAINE HCL 0.5 % OP SOLN
2.0000 [drp] | Freq: Once | OPHTHALMIC | Status: AC
  Administered 2015-12-05: 2 [drp] via OPHTHALMIC
  Filled 2015-12-05: qty 2

## 2015-12-05 MED ORDER — FLUORESCEIN SODIUM 1 MG OP STRP
1.0000 | ORAL_STRIP | Freq: Once | OPHTHALMIC | Status: AC
  Administered 2015-12-05: 1 via OPHTHALMIC
  Filled 2015-12-05: qty 1

## 2015-12-05 NOTE — ED Provider Notes (Signed)
[facility] DEPT Provider Note   First Provider Contact:   First MD Initiated Contact with Patient 12/05/15 1156     By signing my name below, I, Bea Graff, attest that this documentation has been prepared under the direction and in the presence of [employer], PA-C. Electronically Signed: Bea Graff, ED Scribe. 12/05/15. 12:09 PM.   History   Chief Complaint Chief Complaint  Patient presents with  . Eye Pain   The history is provided by the patient and medical records. No language interpreter was used.    HPI Comments:  Shirley [name] is an obese 40 y.o. female who presents to the Emergency Department complaining of left eye pain that began about two days ago. Pt believes she may have scratched the eye with her fingernail and states the pain has been getting progressively worse since. She states it feels as if there is a foreign body present in her eye but does not recall getting anything in the eye. She reports associated HA, redness, swelling and tearing of the left eye. She states she was in a smoke filled environment afterwards which she states exacerbated her symptoms. She has applied cold and hot compresses to the left eye to treat her symptoms. Light and opening the left eye increase her pain. She denies alleviating factors. She denies fever, chills, nausea, vomiting, right eye pain or symptoms. She denies wearing contact lenses.   Past Medical History:  Diagnosis Date  . Abscess of tunica vaginalis    10/09- Abundant S. aureus- sensitive to all abx  . Anxiety   . Blood dyscrasia   . CAD (coronary artery disease) 06/15/2006   s/p Subendocardial MI with PDA angioplasty(no stent) on 06/15/06 and relook  cath 06/19/06 showed patency of site. Cath 12/10- no restenosis or significant CAD progression  . CVA (cerebral vascular accident) ([place]) ~ 02/2014   denies residual on 04/22/2014  . CVA (cerebral vascular accident) [facility])    history of remote right cerebellar  infarct noted on head CT at least since 10/2011  . Depression   . Diabetes mellitus type 2, uncontrolled, with complications ([place])   . Fibromyalgia   . Gastritis   . Gastroparesis    secondary to poorly controlled DM, last emptying study performed 01/2010  was normal but may be falsely positive as pt was on reglan  . GERD (gastroesophageal reflux disease)   . Hepatitis B, chronic (HCC)    Hep BeAb+,Hep B cAb+ & Hep BsAg+ (9/06)  . History of pyelonephritis    H/o GrpB Pyelonephritis (9/06) and UTI- 07/11- E.Coli, 12/10- GBS  . Hyperlipidemia   . Hypertension   . Iron deficiency anemia   . Irregular menses    Small ovarian follicles seen on [id number])  . MI (myocardial infarction) ([place]) 05/2006   PDA percutaneous transluminal coronary angioplasty  . Migraine    "weekly" (04/22/2014)  . N&V (nausea and vomiting)    Chronic. Unclear etiology with multiple admission and ED visits. CT abdomen with and without contrast (02/2011)  showed no acute process. Gastic Emptying scan (01/2010) was normal. Ultrasound of the abdomen was within normal limits. Hepatitis B viral load was undectable. HIV NR. EGD - gastritis, Hpylori + s/p Rx  . Obesity   . OSA (obstructive sleep apnea)    "suppose to wear mask but I don't" (04/22/2014)  . Peripheral neuropathy ([place])   . Pneumonia    "this is probably the 2nd or 3rd time I've had pneumonia" (04/22/2014)  . Recurrent  boils   . Seasonal asthma   . Substance abuse   . Thrombocytosis ([place])    Hem/Onc suggested 2/2 chronic hepatits and/or iron deficiency anemia    Patient Active Problem List   Diagnosis Date Noted  . Non-intractable cyclical vomiting with nausea   . Dehydration 03/27/2015  . Intractable nausea and vomiting 11/11/2014  . Gastroparesis 11/11/2014  . Cough   . Hematemesis 10/13/2014  . DKA (diabetic ketoacidoses) ([place]) 10/13/2014  . Increased anion gap metabolic acidosis 0000000  . Tachycardia 07/21/2014  . Abdominal pain, chronic,  left lower quadrant   . Nausea with vomiting   . Diabetic gastroparesis associated with type 2 diabetes mellitus ([place]) 04/28/2014  . History of Helicobacter pylori infection 04/22/2014  . Vaginal discharge 02/18/2014  . Atypical chest pain 07/22/2013  . Unspecified constipation 07/21/2013  . Tinea corporis 07/21/2013  . Intractable vomiting 04/29/2013  . Dysmenorrhea 04/22/2013  . Headache(784.0) 02/08/2012  . Health care maintenance 01/22/2012  . Chronic hepatitis B ([place]) 03/07/2011  . History of leukocytosis 04/06/2010  . THROMBOCYTOSIS 04/06/2010  . Polysubstance abuse 02/23/2010  . Iron deficiency anemia 11/22/2009  . PERIPHERAL NEUROPATHY 10/01/2009  . Hyperlipidemia 08/30/2009  . POLYNEUROPATHY, DIABETIC 08/30/2009  . Hidradenitis (recurrent boils) 07/07/2008  . Depression 12/27/2007  . Abdominal pain, left lower quadrant 11/21/2007  . FIBROMYALGIA 10/30/2007  . BACK PAIN 04/01/2007  . OBSTRUCTIVE SLEEP APNEA 01/17/2007  . ANXIETY DEPRESSION 06/27/2006  . Chronic ischemic heart disease 06/15/2006  . OBESITY, MORBID 05/15/2006  . MIGRAINE HEADACHE 05/15/2006  . Asthma 05/15/2006  . Essential hypertension 01/16/2006  . IRREGULAR MENSTRUATION 01/16/2006  . PEDAL EDEMA 01/16/2006  . Poorly controlled type 2 diabetes mellitus with peripheral neuropathy ([place]) 01/16/1989    Past Surgical History:  Procedure Laterality Date  . CESAREAN SECTION  1997  . CORONARY ANGIOPLASTY WITH STENT PLACEMENT  2008   "2 stents"  . ESOPHAGOGASTRODUODENOSCOPY N/A 04/23/2014   Procedure: ESOPHAGOGASTRODUODENOSCOPY (EGD);  Surgeon: Winfield Cunas., MD;  Location[facility] ENDOSCOPY;  Service: Endoscopy;  Laterality: N/A;    OB History    No data available       Home Medications    Prior to Admission medications   Medication Sig Start Date End Date Taking? Authorizing Provider  albuterol (PROVENTIL HFA;VENTOLIN HFA) 108 (90 BASE) MCG/ACT inhaler Inhale 2 puffs into the lungs every 4  (four) hours as needed for wheezing or shortness of breath. Patient not taking: Reported on 11/28/2015 04/26/14   Florinda Marker, MD  aspirin EC 81 MG EC tablet Take 1 tablet (81 mg total) by mouth daily. 11/29/15   Holley Raring, MD  atorvastatin (LIPITOR) 80 MG tablet Take 1 tablet (80 mg total) by mouth daily at 6 PM. Patient not taking: Reported on 11/03/2015 04/02/15   Maryellen Pile, MD  ferrous sulfate 325 (65 FE) MG tablet Take 1 tablet (325 mg total) by mouth daily with breakfast. Patient not taking: Reported on 11/03/2015 04/02/15   Maryellen Pile, MD  Insulin Glargine (LANTUS SOLOSTAR) 100 UNIT/ML Solostar Pen Inject 20 Units into the skin daily at 10 pm. Patient not taking: Reported on 11/28/2015 04/02/15   Maryellen Pile, MD  Insulin Pen Needle 31G X 5 MM MISC Inject 20 units of Lantus at night before bed Patient not taking: Reported on 11/28/2015 04/02/15   Maryellen Pile, MD  losartan (COZAAR) 25 MG tablet Take 1 tablet (25 mg total) by mouth daily. 11/29/15   Holley Raring, MD  metoCLOPramide (REGLAN) 10 MG  tablet Take 1 tablet (10 mg total) by mouth every 6 (six) hours. Patient not taking: Reported on 11/28/2015 11/27/15   Olivia Canter Sam, PA-C  omeprazole (PRILOSEC) 40 MG capsule Take 1 capsule (40 mg total) by mouth daily. Patient not taking: Reported on 11/03/2015 04/02/15   Maryellen Pile, MD    Family History Family History  Problem Relation Age of Onset  . Diabetes Father     Social History Social History  Substance Use Topics  . Smoking status: Former Smoker    Types: Cigarettes    Quit date: 04/24/1996  . Smokeless tobacco: Never Used     Comment: quit smoking cigarettes age 26  . Alcohol use 0.0 oz/week     Comment: 04/22/2014 "might have a few drinks a month"     Allergies   Eggs or egg-derived products; Lisinopril; and Morphine and related   Review of Systems Review of Systems A complete 10 system review of systems was obtained and all systems are negative except as noted in  the HPI and PMH.   Physical Exam Updated Vital Signs Triage Vitals: BP 136/92 (BP Location: Left Arm)   Pulse 95   Temp 99.1 F (37.3 C) (Oral)   Resp 18   Ht 5\' 7"  (1.702 m)   Wt 210 lb (95.3 kg)   LMP 11/15/2015   SpO2 100%   BMI 32.89 kg/m   Physical Exam  Constitutional: She is oriented to person, place, and time. She appears well-developed and well-nourished.  HENT:  Head: Normocephalic and atraumatic.  Eyes: Pupils are equal, round, and reactive to light. Left eye exhibits discharge. No foreign body present in the left eye. Left conjunctiva is injected. Left conjunctiva has no hemorrhage. Right eye exhibits normal extraocular motion. Left eye exhibits normal extraocular motion.  Slit lamp exam:      The left eye shows corneal abrasion and fluorescein uptake. The left eye shows no corneal flare, no corneal ulcer, no foreign body, no hyphema, no hypopyon and no anterior chamber bulge.    Visual Acuity  Right Eye Distance:   Left Eye Distance:   Bilateral Distance:    Right Eye Near: R Near: 10/10 Left Eye Near:  L Near: Pt states she is unable to read the chart due to blurriness in L eye. Appears very swollen at this time  Bilateral Near:     Large corneal abrasion over central portion of eye. No foreign body noted.  Neck: Normal range of motion.  Cardiovascular: Normal rate.   Pulmonary/Chest: Effort normal.  Musculoskeletal: Normal range of motion.  Neurological: She is alert and oriented to person, place, and time.  Skin: Skin is warm and dry.  Psychiatric: She has a normal mood and affect. Her behavior is normal.  Nursing note and vitals reviewed.    ED Treatments / Results  Labs (all labs ordered are listed, but only abnormal results are displayed) Labs Reviewed - No data to display  EKG  EKG Interpretation None       Radiology No results found.  Procedures Procedures (including critical care time)  Medications Ordered in ED Medications - No  data to display   Initial Impression / Assessment and Plan / ED Course  I have reviewed the triage vital signs and the nursing notes.  Pertinent labs & imaging results that were available during my care of the patient were reviewed by me and considered in my medical decision making (see chart for details).  Clinical Course  DIAGNOSTIC  STUDIES: Oxygen Saturation is 100% on RA, normal by my interpretation.   COORDINATION OF CARE: 11:59 AM- Will instill tetracaine drops and apply fluorescein strip to check for abrasions/lacerations. Will refer to ophthalmology. Pt verbalizes understanding and agrees to plan.  Pt with corneal abrasion on exam. No evidence of FB.  Exam not concerning for orbital cellulitis, hyphema. No concern for uveitis. Patient will be discharged home with Sulfacetamide ophthalmic drops.   Patient understands to follow up with ophthalmology; return precautions discussed. Patient appears safe for discharged.   I personally performed the services described in this documentation, which was scribed in my presence. The recorded information has been reviewed and is accurate.   Final Clinical Impressions(s) / ED Diagnoses   Final diagnoses:  None    New Prescriptions New Prescriptions   No medications on file     Dalia Heading, PA-C 12/05/15 [place], MD 12/06/15 [phone]

## 2015-12-05 NOTE — ED Notes (Signed)
Declined W/C at D/C and was escorted to lobby by RN. 

## 2015-12-05 NOTE — Discharge Instructions (Signed)
Return here as needed.  Follow-up with the ophthalmologist tomorrow by calling first thing in the morning for an appointment

## 2015-12-05 NOTE — ED Triage Notes (Signed)
Pt thinks she scratched her left eye with finger nail; pt sts pain and tearing

## 2015-12-06 ENCOUNTER — Encounter: Payer: Self-pay | Admitting: Internal Medicine

## 2015-12-06 ENCOUNTER — Emergency Department (HOSPITAL_COMMUNITY)
Admission: EM | Admit: 2015-12-06 | Discharge: 2015-12-07 | Disposition: A | Discharge status: Home / Self Care | Payer: Medicaid Other | Attending: Emergency Medicine | Admitting: Emergency Medicine

## 2015-12-06 ENCOUNTER — Encounter (HOSPITAL_COMMUNITY): Payer: Self-pay | Admitting: *Deleted

## 2015-12-06 ENCOUNTER — Ambulatory Visit: Payer: Medicaid Other

## 2015-12-06 DIAGNOSIS — R1032 Left lower quadrant pain: Secondary | ICD-10-CM | POA: Insufficient documentation

## 2015-12-06 DIAGNOSIS — Z7982 Long term (current) use of aspirin: Secondary | ICD-10-CM | POA: Insufficient documentation

## 2015-12-06 DIAGNOSIS — I2510 Atherosclerotic heart disease of native coronary artery without angina pectoris: Secondary | ICD-10-CM | POA: Insufficient documentation

## 2015-12-06 DIAGNOSIS — Z794 Long term (current) use of insulin: Secondary | ICD-10-CM | POA: Insufficient documentation

## 2015-12-06 DIAGNOSIS — E1143 Type 2 diabetes mellitus with diabetic autonomic (poly)neuropathy: Secondary | ICD-10-CM | POA: Insufficient documentation

## 2015-12-06 DIAGNOSIS — I10 Essential (primary) hypertension: Secondary | ICD-10-CM | POA: Insufficient documentation

## 2015-12-06 DIAGNOSIS — R109 Unspecified abdominal pain: Secondary | ICD-10-CM

## 2015-12-06 DIAGNOSIS — Z955 Presence of coronary angioplasty implant and graft: Secondary | ICD-10-CM | POA: Insufficient documentation

## 2015-12-06 DIAGNOSIS — Z87891 Personal history of nicotine dependence: Secondary | ICD-10-CM | POA: Insufficient documentation

## 2015-12-06 DIAGNOSIS — E1140 Type 2 diabetes mellitus with diabetic neuropathy, unspecified: Secondary | ICD-10-CM | POA: Insufficient documentation

## 2015-12-06 DIAGNOSIS — Z79899 Other long term (current) drug therapy: Secondary | ICD-10-CM | POA: Insufficient documentation

## 2015-12-06 DIAGNOSIS — J45909 Unspecified asthma, uncomplicated: Secondary | ICD-10-CM | POA: Insufficient documentation

## 2015-12-06 DIAGNOSIS — Z8673 Personal history of transient ischemic attack (TIA), and cerebral infarction without residual deficits: Secondary | ICD-10-CM | POA: Insufficient documentation

## 2015-12-06 LAB — URINE MICROSCOPIC-ADD ON

## 2015-12-06 LAB — RAPID URINE DRUG SCREEN, HOSP PERFORMED
AMPHETAMINES: NOT DETECTED
Barbiturates: NOT DETECTED
Benzodiazepines: NOT DETECTED
Cocaine: NOT DETECTED
OPIATES: NOT DETECTED
TETRAHYDROCANNABINOL: NOT DETECTED

## 2015-12-06 LAB — COMPREHENSIVE METABOLIC PANEL
ALBUMIN: 3.6 g/dL (ref 3.5–5.0)
ALK PHOS: 53 U/L (ref 38–126)
ALT: 10 U/L — ABNORMAL LOW (ref 14–54)
ANION GAP: 8 (ref 5–15)
AST: 21 U/L (ref 15–41)
BILIRUBIN TOTAL: 0.6 mg/dL (ref 0.3–1.2)
BUN: 15 mg/dL (ref 6–20)
CALCIUM: 9.3 mg/dL (ref 8.9–10.3)
CO2: 21 mmol/L — AB (ref 22–32)
Chloride: 107 mmol/L (ref 101–111)
Creatinine, Ser: 0.84 mg/dL (ref 0.44–1.00)
GFR calc Af Amer: >60 mL/min (ref >60)
GFR calc non Af Amer: >60 mL/min (ref >60)
GLUCOSE: 165 mg/dL — AB (ref 65–99)
POTASSIUM: 3.8 mmol/L (ref 3.5–5.1)
SODIUM: 136 mmol/L (ref 135–145)
TOTAL PROTEIN: 8.3 g/dL — AB (ref 6.5–8.1)

## 2015-12-06 LAB — URINALYSIS, ROUTINE W REFLEX MICROSCOPIC
BILIRUBIN URINE: NEGATIVE
GLUCOSE, UA: 250 mg/dL — AB
HGB URINE DIPSTICK: NEGATIVE
KETONES UR: 15 mg/dL — AB
Leukocytes, UA: NEGATIVE
NITRITE: NEGATIVE
PH: 6 (ref 5.0–8.0)
Protein, ur: 30 mg/dL — AB
SPECIFIC GRAVITY, URINE: 1.021 (ref 1.005–1.030)

## 2015-12-06 LAB — I-STAT CG4 LACTIC ACID, ED: LACTIC ACID, VENOUS: 1.96 mmol/L — AB (ref 0.5–1.9)

## 2015-12-06 LAB — CBC
HEMATOCRIT: 28.6 % — AB (ref 36.0–46.0)
HEMOGLOBIN: 8.7 g/dL — AB (ref 12.0–15.0)
MCH: 19.4 pg — AB (ref 26.0–34.0)
MCHC: 30.4 g/dL (ref 30.0–36.0)
MCV: 63.7 fL — ABNORMAL LOW (ref 78.0–100.0)
Platelets: 407 10*3/uL — ABNORMAL HIGH (ref 150–400)
RBC: 4.49 MIL/uL (ref 3.87–5.11)
RDW: 18.5 % — AB (ref 11.5–15.5)
WBC: 20.7 10*3/uL — ABNORMAL HIGH (ref 4.0–10.5)

## 2015-12-06 LAB — I-STAT TROPONIN, ED: Troponin i, poc: 0.01 ng/mL (ref 0.00–0.08)

## 2015-12-06 LAB — I-STAT BETA HCG BLOOD, ED (MC, WL, AP ONLY)

## 2015-12-06 LAB — LIPASE, BLOOD: Lipase: 28 U/L (ref 11–51)

## 2015-12-06 MED ORDER — METOCLOPRAMIDE HCL 5 MG/ML IJ SOLN
10.0000 mg | Freq: Once | INTRAMUSCULAR | Status: AC
  Administered 2015-12-06: 10 mg via INTRAVENOUS
  Filled 2015-12-06: qty 2

## 2015-12-06 MED ORDER — SODIUM CHLORIDE 0.9 % IV BOLUS (SEPSIS)
1000.0000 mL | Freq: Once | INTRAVENOUS | Status: AC
  Administered 2015-12-06: 1000 mL via INTRAVENOUS

## 2015-12-06 MED ORDER — ONDANSETRON 4 MG PO TBDP
4.0000 mg | ORAL_TABLET | Freq: Once | ORAL | Status: AC | PRN
  Administered 2015-12-06: 4 mg via ORAL

## 2015-12-06 MED ORDER — OXYCODONE-ACETAMINOPHEN 5-325 MG PO TABS
ORAL_TABLET | ORAL | Status: AC
  Filled 2015-12-06: qty 1

## 2015-12-06 MED ORDER — HYDROMORPHONE HCL 1 MG/ML IJ SOLN
1.0000 mg | Freq: Once | INTRAMUSCULAR | Status: AC
  Administered 2015-12-06: 1 mg via INTRAVENOUS
  Filled 2015-12-06: qty 1

## 2015-12-06 MED ORDER — SODIUM CHLORIDE 0.9 % IV BOLUS (SEPSIS)
1000.0000 mL | Freq: Once | INTRAVENOUS | Status: AC
Start: 2015-12-06 — End: 2015-12-07
  Administered 2015-12-06: 1000 mL via INTRAVENOUS

## 2015-12-06 MED ORDER — ONDANSETRON 4 MG PO TBDP
ORAL_TABLET | ORAL | Status: AC
  Filled 2015-12-06: qty 1

## 2015-12-06 MED ORDER — OXYCODONE-ACETAMINOPHEN 5-325 MG PO TABS
1.0000 | ORAL_TABLET | Freq: Once | ORAL | Status: AC
  Administered 2015-12-06: 1 via ORAL

## 2015-12-06 NOTE — ED Triage Notes (Signed)
Pt c/o LLQ abdominal pain since 0800 this morning  Pt reports emesis x 5, diarrhea x 1. Pt also c/o chest pain and right arm numbness with shooting pains since last night.

## 2015-12-06 NOTE — ED Provider Notes (Signed)
[facility] DEPT Provider Note   CSN: [id number] Arrival date & time: 12/06/15  [id number]     History   Chief Complaint Chief Complaint  Patient presents with  . Abdominal Pain    HPI Verble Shirley [name] is a 40 y.o. female.  HPI  40 y.o. female with a hx of CAD, CVA 2015, DM2, Gastroparesis, GERD, HTN, HLD, presents to the Emergency Department today complaining of LLQ abdominal pain with onset this AM around 0800. Hx of the same earlier this month (see below). Admitted with relief of symptoms after fluids and antiemetics. Notes pain is 10/10 and isolated to LLQ. No hematochezia. No melena. Same as previous episode. States the pain is sharp. Pt shaking in bed and says that it makes it feel better. No vaginal bleeding/discharge. No fevers. No headaches. No dizziness. Pt also noted Chest Pain occurred last night. Notes that she was at rest when the pain occurred, but was able to sleep through the night. States that it is sharp and rates 10/10 and has been constant since then. No other symptoms noted. No hx DVT/PE. Pt not on blood thinners.   11-29-15 DCed from Hospital Obs 11-28-15 ED Same 11-27-15 ED Same  Past Medical History:  Diagnosis Date  . Abscess of tunica vaginalis    10/09- Abundant S. aureus- sensitive to all abx  . Anxiety   . Blood dyscrasia   . CAD (coronary artery disease) 06/15/2006   s/p Subendocardial MI with PDA angioplasty(no stent) on 06/15/06 and relook  cath 06/19/06 showed patency of site. Cath 12/10- no restenosis or significant CAD progression  . CVA (cerebral vascular accident) ([address]) ~ 02/2014   denies residual on 04/22/2014  . CVA (cerebral vascular accident) [facility])    history of remote right cerebellar infarct noted on head CT at least since 10/2011  . Depression   . Diabetes mellitus type 2, uncontrolled, with complications ([place])   . Fibromyalgia   . Gastritis   . Gastroparesis    secondary to poorly controlled DM, last emptying study performed 01/2010  was  normal but may be falsely positive as pt was on reglan  . GERD (gastroesophageal reflux disease)   . Hepatitis B, chronic (HCC)    Hep BeAb+,Hep B cAb+ & Hep BsAg+ (9/06)  . History of pyelonephritis    H/o GrpB Pyelonephritis (9/06) and UTI- 07/11- E.Coli, 12/10- GBS  . Hyperlipidemia   . Hypertension   . Iron deficiency anemia   . Irregular menses    Small ovarian follicles seen on [id number])  . MI (myocardial infarction) ([place]) 05/2006   PDA percutaneous transluminal coronary angioplasty  . Migraine    "weekly" (04/22/2014)  . N&V (nausea and vomiting)    Chronic. Unclear etiology with multiple admission and ED visits. CT abdomen with and without contrast (02/2011)  showed no acute process. Gastic Emptying scan (01/2010) was normal. Ultrasound of the abdomen was within normal limits. Hepatitis B viral load was undectable. HIV NR. EGD - gastritis, Hpylori + s/p Rx  . Obesity   . OSA (obstructive sleep apnea)    "suppose to wear mask but I don't" (04/22/2014)  . Peripheral neuropathy ([place])   . Pneumonia    "this is probably the 2nd or 3rd time I've had pneumonia" (04/22/2014)  . Recurrent boils   . Seasonal asthma   . Substance abuse   . Thrombocytosis ([place])    Hem/Onc suggested 2/2 chronic hepatits and/or iron deficiency anemia    Patient Active Problem List  Diagnosis Date Noted  . Non-intractable cyclical vomiting with nausea   . Dehydration 03/27/2015  . Intractable nausea and vomiting 11/11/2014  . Gastroparesis 11/11/2014  . Cough   . Hematemesis 10/13/2014  . DKA (diabetic ketoacidoses) ([place]) 10/13/2014  . Increased anion gap metabolic acidosis 0000000  . Tachycardia 07/21/2014  . Abdominal pain, chronic, left lower quadrant   . Nausea with vomiting   . Diabetic gastroparesis associated with type 2 diabetes mellitus ([place]) 04/28/2014  . History of Helicobacter pylori infection 04/22/2014  . Vaginal discharge 02/18/2014  . Atypical chest pain 07/22/2013  .  Unspecified constipation 07/21/2013  . Tinea corporis 07/21/2013  . Intractable vomiting 04/29/2013  . Dysmenorrhea 04/22/2013  . Headache(784.0) 02/08/2012  . Health care maintenance 01/22/2012  . Chronic hepatitis B ([place]) 03/07/2011  . History of leukocytosis 04/06/2010  . THROMBOCYTOSIS 04/06/2010  . Polysubstance abuse 02/23/2010  . Iron deficiency anemia 11/22/2009  . PERIPHERAL NEUROPATHY 10/01/2009  . Hyperlipidemia 08/30/2009  . POLYNEUROPATHY, DIABETIC 08/30/2009  . Hidradenitis (recurrent boils) 07/07/2008  . Depression 12/27/2007  . Abdominal pain, left lower quadrant 11/21/2007  . FIBROMYALGIA 10/30/2007  . BACK PAIN 04/01/2007  . OBSTRUCTIVE SLEEP APNEA 01/17/2007  . ANXIETY DEPRESSION 06/27/2006  . Chronic ischemic heart disease 06/15/2006  . OBESITY, MORBID 05/15/2006  . MIGRAINE HEADACHE 05/15/2006  . Asthma 05/15/2006  . Essential hypertension 01/16/2006  . IRREGULAR MENSTRUATION 01/16/2006  . PEDAL EDEMA 01/16/2006  . Poorly controlled type 2 diabetes mellitus with peripheral neuropathy ([place]) 01/16/1989    Past Surgical History:  Procedure Laterality Date  . CESAREAN SECTION  1997  . CORONARY ANGIOPLASTY WITH STENT PLACEMENT  2008   "2 stents"  . ESOPHAGOGASTRODUODENOSCOPY N/A 04/23/2014   Procedure: ESOPHAGOGASTRODUODENOSCOPY (EGD);  Surgeon: Winfield Cunas., MD;  Location: [facility] ENDOSCOPY;  Service: Endoscopy;  Laterality: N/A;    OB History    No data available       Home Medications    Prior to Admission medications   Medication Sig Start Date End Date Taking? Authorizing Provider  albuterol (PROVENTIL HFA;VENTOLIN HFA) 108 (90 BASE) MCG/ACT inhaler Inhale 2 puffs into the lungs every 4 (four) hours as needed for wheezing or shortness of breath. Patient not taking: Reported on 11/28/2015 04/26/14   Florinda Marker, MD  aspirin EC 81 MG EC tablet Take 1 tablet (81 mg total) by mouth daily. 11/29/15   Holley Raring, MD  atorvastatin (LIPITOR) 80 MG  tablet Take 1 tablet (80 mg total) by mouth daily at 6 PM. Patient not taking: Reported on 11/03/2015 04/02/15   Maryellen Pile, MD  ferrous sulfate 325 (65 FE) MG tablet Take 1 tablet (325 mg total) by mouth daily with breakfast. Patient not taking: Reported on 11/03/2015 04/02/15   Maryellen Pile, MD  HYDROcodone-acetaminophen (NORCO/VICODIN) 5-325 MG tablet Take 1 tablet by mouth every 6 (six) hours as needed for moderate pain. 12/05/15   Dalia Heading, PA-C  Insulin Glargine (LANTUS SOLOSTAR) 100 UNIT/ML Solostar Pen Inject 20 Units into the skin daily at 10 pm. Patient not taking: Reported on 11/28/2015 04/02/15   Maryellen Pile, MD  Insulin Pen Needle 31G X 5 MM MISC Inject 20 units of Lantus at night before bed Patient not taking: Reported on 11/28/2015 04/02/15   Maryellen Pile, MD  losartan (COZAAR) 25 MG tablet Take 1 tablet (25 mg total) by mouth daily. 11/29/15   Holley Raring, MD  metoCLOPramide (REGLAN) 10 MG tablet Take 1 tablet (10 mg total) by mouth every 6 (  six) hours. Patient not taking: Reported on 11/28/2015 11/27/15   Olivia Canter Sam, PA-C  omeprazole (PRILOSEC) 40 MG capsule Take 1 capsule (40 mg total) by mouth daily. Patient not taking: Reported on 11/03/2015 04/02/15   Maryellen Pile, MD    Family History Family History  Problem Relation Age of Onset  . Diabetes Father     Social History Social History  Substance Use Topics  . Smoking status: Former Smoker    Types: Cigarettes    Quit date: 04/24/1996  . Smokeless tobacco: Never Used     Comment: quit smoking cigarettes age 67  . Alcohol use 0.0 oz/week     Comment: 04/22/2014 "might have a few drinks a month"     Allergies   Eggs or egg-derived products; Lisinopril; and Morphine and related   Review of Systems Review of Systems ROS reviewed and all are negative for acute change except as noted in the HPI.  Physical Exam Updated Vital Signs BP 152/96 (BP Location: Left Arm)   Pulse (!) 52   Temp 98.9 F (37.2 C)  (Oral)   Resp 18   LMP 11/15/2015   SpO2 100%   Physical Exam  Constitutional: She is oriented to person, place, and time. Vital signs are normal. She appears well-developed and well-nourished.  Pt shaking in bed. Able to communicate effectively.   HENT:  Head: Normocephalic and atraumatic.  Right Ear: Hearing normal.  Left Ear: Hearing normal.  Eyes: Conjunctivae and EOM are normal. Pupils are equal, round, and reactive to light.  Neck: Normal range of motion. Neck supple.  Cardiovascular: Regular rhythm, normal heart sounds, intact distal pulses and normal pulses.  Tachycardia present.   Pulmonary/Chest: Effort normal. She exhibits tenderness.  TTP along sternum.   Abdominal: Soft. Bowel sounds are normal. There is no tenderness. There is no rigidity, no rebound, no guarding, no CVA tenderness, no tenderness at McBurney's point and negative Murphy's sign.  Neurological: She is alert and oriented to person, place, and time.  Skin: Skin is warm and dry.  Psychiatric: She has a normal mood and affect. Her speech is normal and behavior is normal. Thought content normal.   ED Treatments / Results  Labs (all labs ordered are listed, but only abnormal results are displayed) Labs Reviewed  COMPREHENSIVE METABOLIC PANEL - Abnormal; Notable for the following:       Result Value   CO2 21 (*)    Glucose, Bld 165 (*)    Total Protein 8.3 (*)    ALT 10 (*)    All other components within normal limits  CBC - Abnormal; Notable for the following:    WBC 20.7 (*)    Hemoglobin 8.7 (*)    HCT 28.6 (*)    MCV 63.7 (*)    MCH 19.4 (*)    RDW 18.5 (*)    Platelets 407 (*)    All other components within normal limits  URINALYSIS, ROUTINE W REFLEX MICROSCOPIC (NOT AT [facility]) - Abnormal; Notable for the following:    APPearance CLOUDY (*)    Glucose, UA 250 (*)    Ketones, ur 15 (*)    Protein, ur 30 (*)    All other components within normal limits  URINE MICROSCOPIC-ADD ON - Abnormal; Notable  for the following:    Squamous Epithelial / LPF 6-30 (*)    Bacteria, UA MANY (*)    All other components within normal limits  I-STAT CG4 LACTIC ACID, ED - Abnormal; Notable  for the following:    Lactic Acid, Venous 1.96 (*)    All other components within normal limits  LIPASE, BLOOD  URINE RAPID DRUG SCREEN, HOSP PERFORMED  I-STAT BETA HCG BLOOD, ED ([facility], [facility], [facility] ONLY)  I-STAT TROPOININ, ED  I-STAT CG4 LACTIC ACID, ED  I-STAT CG4 LACTIC ACID, ED  I-STAT TROPOININ, ED    EKG  EKG Interpretation  Date/Time:  Monday December 06 2015 18:05:34 EDT Ventricular Rate:  126 PR Interval:  120 QRS Duration: 70 QT Interval:  310 QTC Calculation: 448 R Axis:   52 Text Interpretation:  Sinus tachycardia with occasional Premature ventricular complexes Cannot rule out Anterior infarct , age undetermined Abnormal ECG When compared with ECG of 11/28/2015,  Premature ventricular complexes are now Present Confirmed by [facility]  MD, DAVID ([id number]) on 12/06/2015 11:51:29 PM      Radiology No results found.  Procedures Procedures (including critical care time)  Medications Ordered in ED Medications  ondansetron (ZOFRAN-ODT) 4 MG disintegrating tablet (not administered)  oxyCODONE-acetaminophen (PERCOCET/ROXICET) 5-325 MG per tablet (not administered)  promethazine (PHENERGAN) injection 12.5 mg (not administered)  ondansetron (ZOFRAN-ODT) disintegrating tablet 4 mg (4 mg Oral Given 12/06/15 1800)  oxyCODONE-acetaminophen (PERCOCET/ROXICET) 5-325 MG per tablet 1 tablet (1 tablet Oral Given 12/06/15 1802)  sodium chloride 0.9 % bolus 1,000 mL (1,000 mLs Intravenous New Bag/Given 12/06/15 2314)  HYDROmorphone (DILAUDID) injection 1 mg (1 mg Intravenous Given 12/06/15 2314)  metoCLOPramide (REGLAN) injection 10 mg (10 mg Intravenous Given 12/06/15 2314)  sodium chloride 0.9 % bolus 1,000 mL (1,000 mLs Intravenous New Bag/Given 12/06/15 2354)   Initial Impression / Assessment and Plan / ED Course  I have  reviewed the triage vital signs and the nursing notes.  Pertinent labs & imaging results that were available during my care of the patient were reviewed by me and considered in my medical decision making (see chart for details).  Clinical Course    Final Clinical Impressions(s) / ED Diagnoses  I have reviewed and evaluated the relevant laboratory values I have reviewed and evaluated the relevant imaging studies.  I have interpreted the relevant EKG. I have reviewed the relevant previous healthcare records. I obtained HPI from historian. Patient discussed with supervising physician  ED Course:  Assessment: Pt is a 70yF with hx CAD, CVA 2015, DM2, Gastroparesis, GERD, HTN, HLD who presents with LLQ abdominal pain with associated N/V/D. Hx same with similar presentation 7 days ago as well as previous ED visits before that. On exam, pt in NAD. Nontoxic/nonseptic appearing. VS with Tachycardia present. Appears to be baseline for patient as she had persistant tachycardia during admission and discharge. Afebrile. Lungs CTA. Heart RRR. Abdomen nontender soft. iStat Lactic Acid 1.96. iStat Trop negative. EKG sinus tachycardia. Otherwise unremarkable. CBC with leukocytosis 20.7. BMP unremarkable. No Gap Did show Hyperglycemia at 165. UA without infection. Given fluids, antiemetics and pain medication in ED. Symptoms resolved and patient resting comfortably. Repeat Lactic Acid 1.08. Delta Trop Negative. Low suspicion for infectious etiology due to recent pevious admission for similar. Likely Leukocytosis from stress response from emesis. Pt endorses same presentation as previous admission. Related to gastroparesis. Low indication for PE or ACS. Wells Score 1.5. Heart Score 2. Given Rx Phenergan. Plan is to [place] with follow up to PCP. At time of discharge, Patient is in no acute distress. Vital Signs are stable. Patient is able to ambulate. Patient able to tolerate PO.    Disposition/Plan:  DC  Home Additional Shirley discharge instructions  given and discussed with patient.  Pt Instructed to f/u with PCP in the next week for evaluation and treatment of symptoms. Return precautions given Pt acknowledges and agrees with plan  Supervising Physician Forde Dandy, MD   Final diagnoses:  Abdominal pain, unspecified abdominal location    New Prescriptions New Prescriptions   No medications on file      Shary Decamp, PA-C 12/07/15 [place] Duo Liu, MD 12/07/15 1328

## 2015-12-07 LAB — I-STAT TROPONIN, ED: Troponin i, poc: 0.02 ng/mL (ref 0.00–0.08)

## 2015-12-07 LAB — I-STAT CG4 LACTIC ACID, ED: Lactic Acid, Venous: 1.08 mmol/L (ref 0.5–1.9)

## 2015-12-07 MED ORDER — PROMETHAZINE HCL 25 MG/ML IJ SOLN
12.5000 mg | Freq: Once | INTRAMUSCULAR | Status: AC
Start: 2015-12-07 — End: 2015-12-07
  Administered 2015-12-07: 12.5 mg via INTRAVENOUS
  Filled 2015-12-07: qty 1

## 2015-12-07 MED ORDER — OXYCODONE-ACETAMINOPHEN 5-325 MG PO TABS
1.0000 | ORAL_TABLET | Freq: Once | ORAL | Status: AC
  Administered 2015-12-07: 1 via ORAL
  Filled 2015-12-07: qty 1

## 2015-12-07 MED ORDER — PROMETHAZINE HCL 25 MG PO TABS: 25.0000 mg | ORAL_TABLET | Freq: Four times a day (QID) | ORAL | 0 refills | Status: DC | PRN

## 2015-12-07 NOTE — Discharge Instructions (Signed)
Please read and follow all provided instructions.  Your diagnoses today include:  1. Abdominal pain, unspecified abdominal location    Tests performed today include: Blood counts and electrolytes Blood tests to check liver and kidney function Blood tests to check pancreas function Urine test to look for infection and pregnancy (in women) Vital signs. See below for your results today.   Medications prescribed:   Take any prescribed medications only as directed.  Home care instructions:  Follow any educational materials contained in this packet.  Follow-up instructions: Please follow-up with your primary care provider in the next 2 days for further evaluation of your symptoms.    Return instructions:  SEEK IMMEDIATE MEDICAL ATTENTION IF: The pain does not go away or becomes severe  A temperature above 101F develops  Repeated vomiting occurs (multiple episodes)  The pain becomes localized to portions of the abdomen. The right side could possibly be appendicitis. In an adult, the left lower portion of the abdomen could be colitis or diverticulitis.  Blood is being passed in stools or vomit (bright red or black tarry stools)  You develop chest pain, difficulty breathing, dizziness or fainting, or become confused, poorly responsive, or inconsolable (young children) If you have any other emergent concerns regarding your health  Additional Information: Abdominal (belly) pain can be caused by many things. Your caregiver performed an examination and possibly ordered blood/urine tests and imaging (CT scan, x-rays, ultrasound). Many cases can be observed and treated at home after initial evaluation in the emergency department. Even though you are being discharged home, abdominal pain can be unpredictable. Therefore, you need a repeated exam if your pain does not resolve, returns, or worsens. Most patients with abdominal pain don't have to be admitted to the hospital or have surgery, but serious  problems like appendicitis and gallbladder attacks can start out as nonspecific pain. Many abdominal conditions cannot be diagnosed in one visit, so follow-up evaluations are very important.  Your vital signs today were: BP 111/69    Pulse 107    Temp 98.9 F (37.2 C) (Oral)    Resp 13    LMP 11/15/2015    SpO2 99%  If your blood pressure (bp) was elevated above 135/85 this visit, please have this repeated by your doctor within one month. --------------

## 2015-12-17 ENCOUNTER — Encounter: Payer: Self-pay | Admitting: Internal Medicine

## 2015-12-24 IMAGING — US US TRANSVAGINAL NON-OB
1 series · 13 of 25 positions shown · non-contrast
Comparison: 04/11/2013

CLINICAL DATA: Lower abdominal pain

EXAM:
TRANSABDOMINAL AND TRANSVAGINAL ULTRASOUND OF PELVIS
TECHNIQUE: Both transabdominal and transvaginal ultrasound examinations of the
pelvis were performed. Transabdominal technique was performed for
global imaging of the pelvis including uterus, ovaries, adnexal
regions, and pelvic cul-de-sac. It was necessary to proceed with
endovaginal exam following the transabdominal exam to attempt to
better visualize the endometrium and adnexa. However, the
transvaginal images were suboptimal due to shadowing from the
fibroid.

[Series 1: us transvaginal non-ob · 0.20mm/px · 51 acquisitions, 13 frames shown]
[im 1/51]
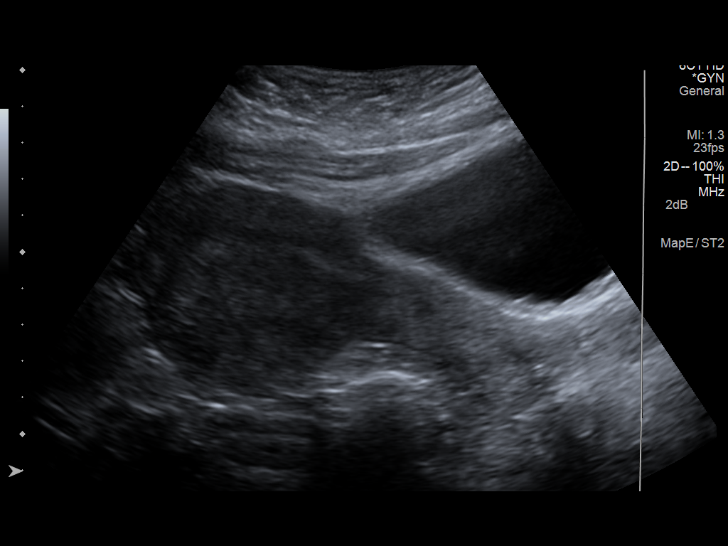
[im 5/51]
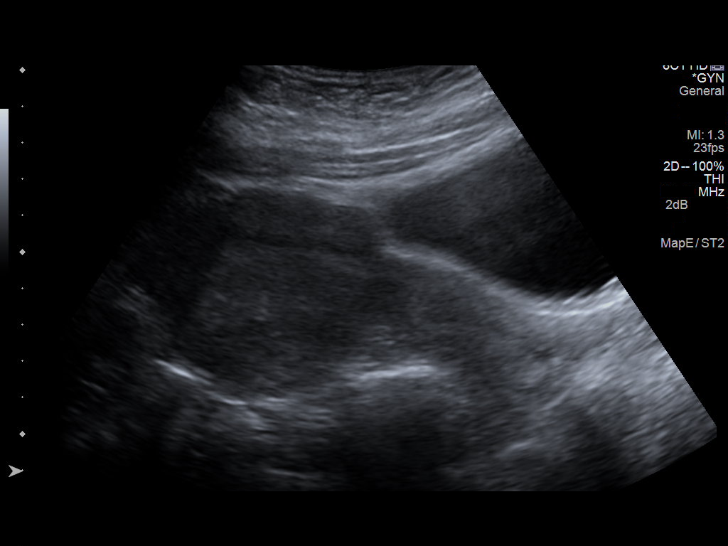
[im 9/51]
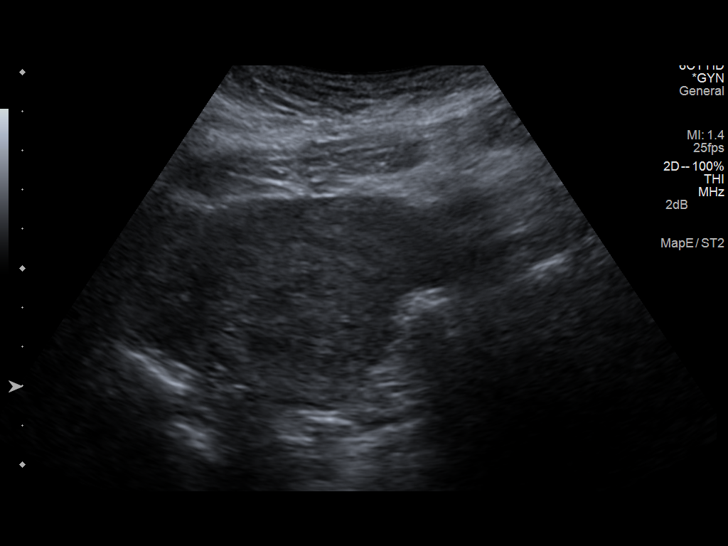
[im 13/51]
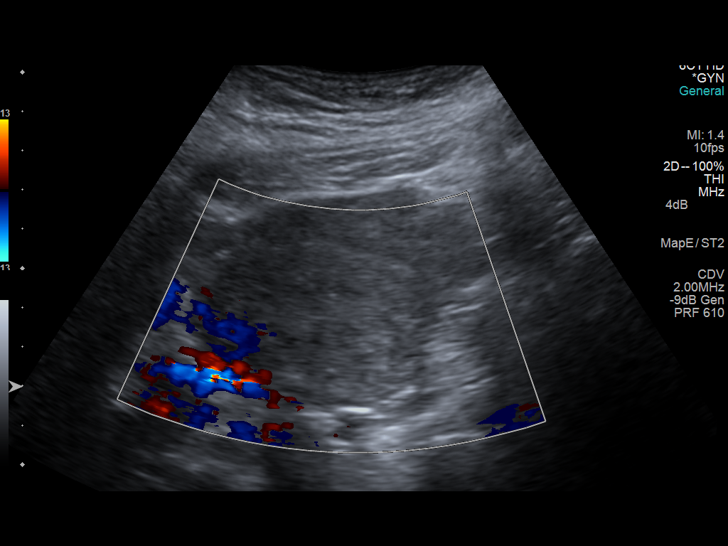
[im 17/51]
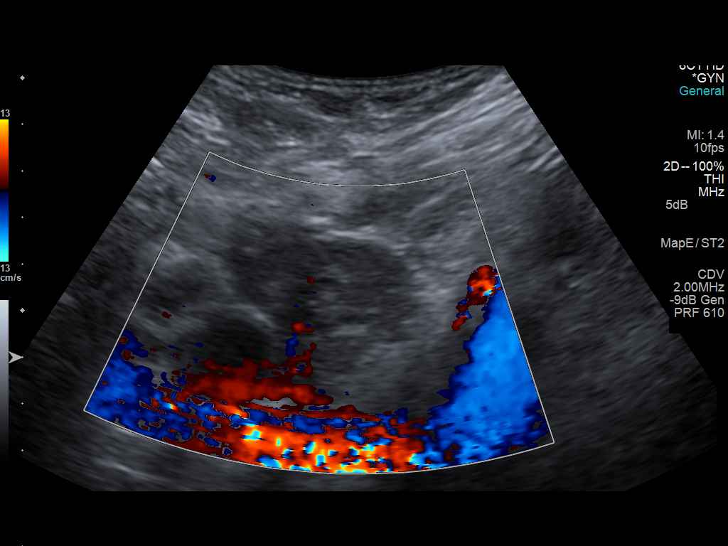
[im 21/51]
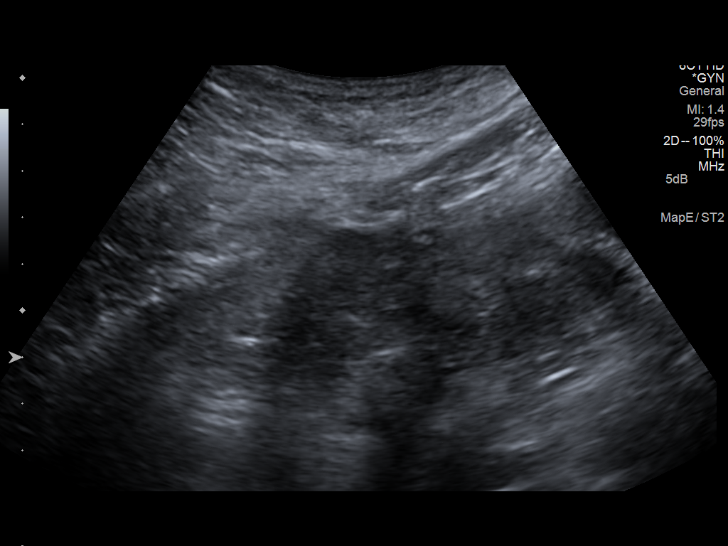
[im 26/51]
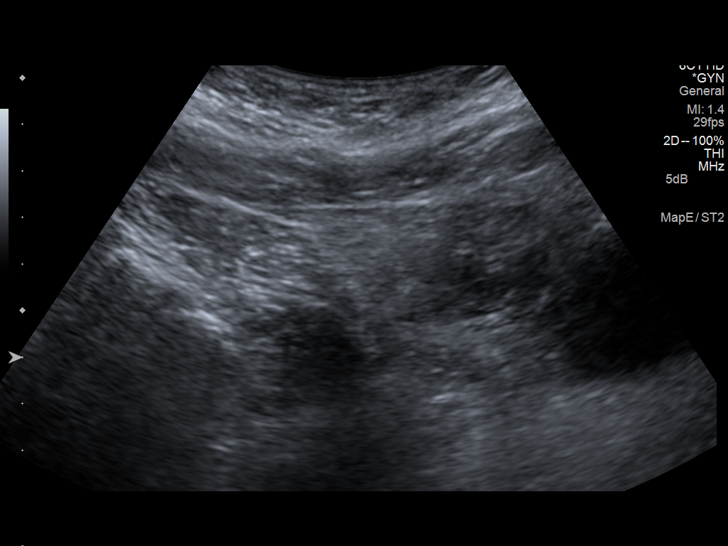
[im 30/51]
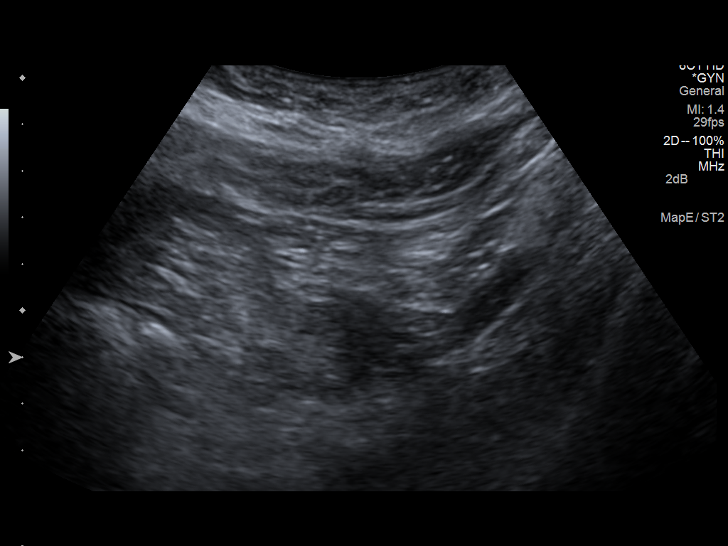
[im 34/51]
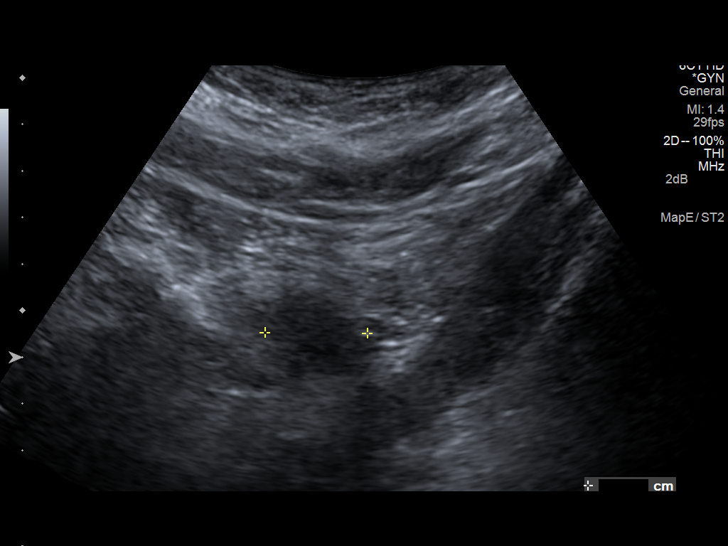
[im 38/51]
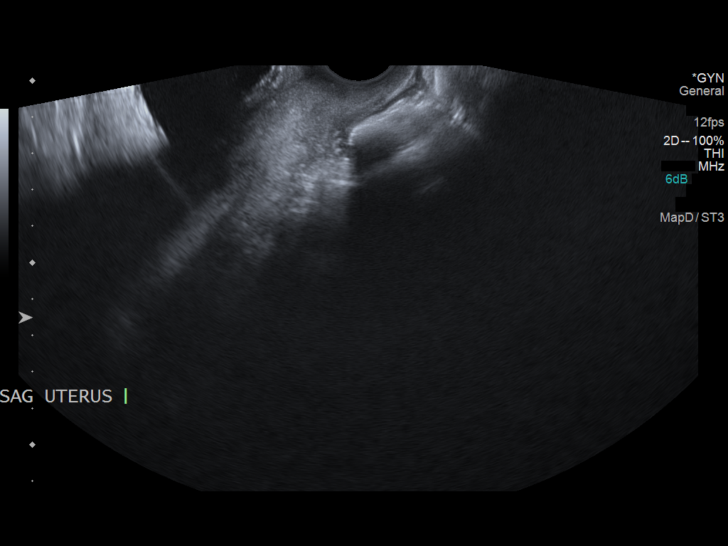
[im 42/51]
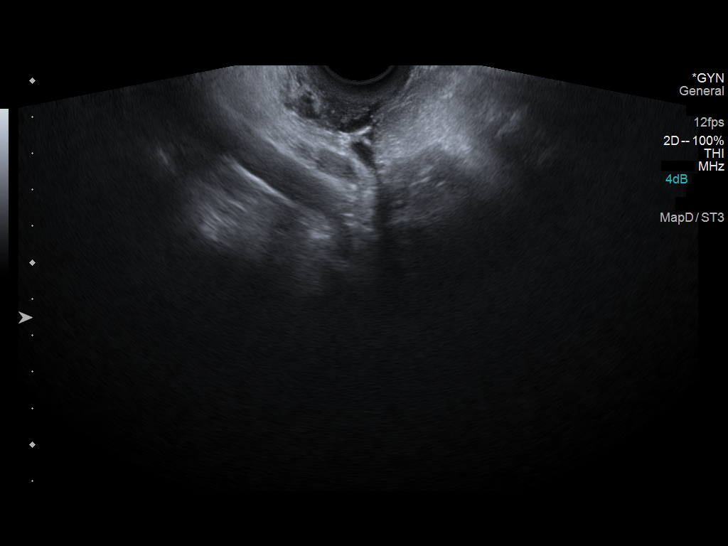
[im 46/51]
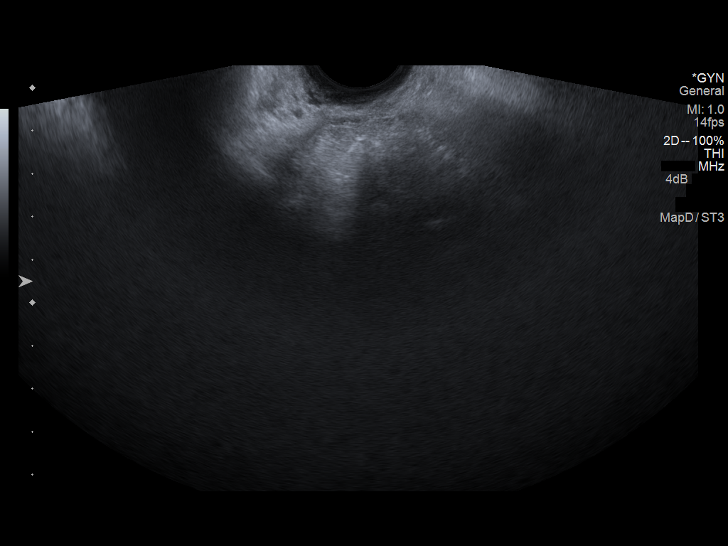
[im 51/51]
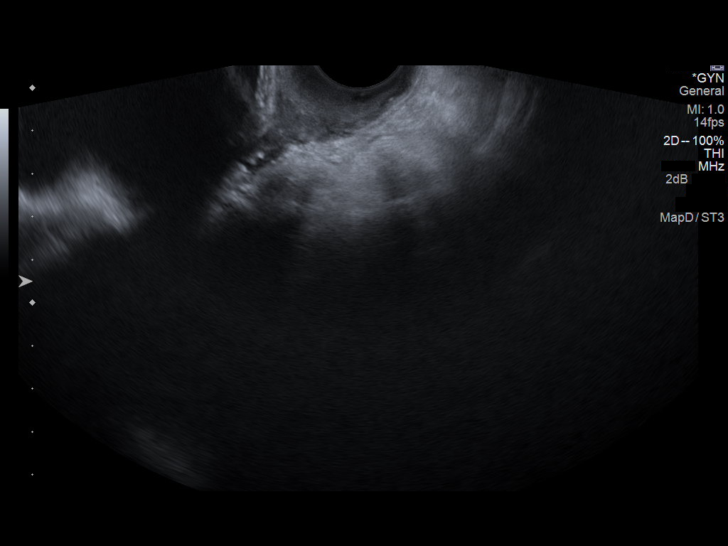

[13 of 25 positions shown; findings below may reference images not displayed]

FINDINGS: Uterus

Measurements: 11.6 x 5.6 x 6.3 cm appear. Large posterior fibroid
centered intramural however it abuts the endometrium, distorting and
displacing it anteriorly. The fibroid measures 4.5 x 3.6 x 4.9 cm.

Endometrium

Thickness: 5 mm.  No focal abnormality visualized.

Right ovary

Measurements: 3.9 x 3.4 x 3.3 cm. Normal appearance/no adnexal mass.

Left ovary

Measurements: 2.8 x 2.0 x 2.2 cm. Normal appearance/no adnexal mass.

Other findings

No free fluid.
IMPRESSION: Posterior 4.9 cm fibroid abuts/distorts the endometrium.

Normal sonographic appearance to the ovaries. No fluid collection or
appreciable hydrosalpinx.

## 2016-01-06 ENCOUNTER — Telehealth: Payer: Self-pay | Admitting: Internal Medicine

## 2016-01-06 NOTE — Telephone Encounter (Signed)
APT. REMINDER CALL, LMTCB °

## 2016-01-07 ENCOUNTER — Ambulatory Visit: Payer: Medicaid Other

## 2016-01-07 ENCOUNTER — Encounter: Payer: Self-pay | Admitting: Internal Medicine

## 2016-01-09 ENCOUNTER — Emergency Department (HOSPITAL_COMMUNITY): Payer: Self-pay

## 2016-01-09 ENCOUNTER — Emergency Department (HOSPITAL_COMMUNITY)
Admission: EM | Admit: 2016-01-09 | Discharge: 2016-01-09 | Disposition: A | Discharge status: Home / Self Care | Payer: Self-pay | Attending: Emergency Medicine | Admitting: Emergency Medicine

## 2016-01-09 ENCOUNTER — Encounter (HOSPITAL_COMMUNITY): Payer: Self-pay

## 2016-01-09 DIAGNOSIS — R0789 Other chest pain: Secondary | ICD-10-CM | POA: Insufficient documentation

## 2016-01-09 DIAGNOSIS — K3184 Gastroparesis: Secondary | ICD-10-CM

## 2016-01-09 DIAGNOSIS — R51 Headache: Secondary | ICD-10-CM | POA: Insufficient documentation

## 2016-01-09 DIAGNOSIS — I252 Old myocardial infarction: Secondary | ICD-10-CM | POA: Insufficient documentation

## 2016-01-09 DIAGNOSIS — Z7982 Long term (current) use of aspirin: Secondary | ICD-10-CM | POA: Insufficient documentation

## 2016-01-09 DIAGNOSIS — Z794 Long term (current) use of insulin: Secondary | ICD-10-CM | POA: Insufficient documentation

## 2016-01-09 DIAGNOSIS — Z955 Presence of coronary angioplasty implant and graft: Secondary | ICD-10-CM | POA: Insufficient documentation

## 2016-01-09 DIAGNOSIS — Z8673 Personal history of transient ischemic attack (TIA), and cerebral infarction without residual deficits: Secondary | ICD-10-CM | POA: Insufficient documentation

## 2016-01-09 DIAGNOSIS — E1140 Type 2 diabetes mellitus with diabetic neuropathy, unspecified: Secondary | ICD-10-CM | POA: Insufficient documentation

## 2016-01-09 DIAGNOSIS — I10 Essential (primary) hypertension: Secondary | ICD-10-CM | POA: Insufficient documentation

## 2016-01-09 DIAGNOSIS — I2510 Atherosclerotic heart disease of native coronary artery without angina pectoris: Secondary | ICD-10-CM | POA: Insufficient documentation

## 2016-01-09 DIAGNOSIS — Z87891 Personal history of nicotine dependence: Secondary | ICD-10-CM | POA: Insufficient documentation

## 2016-01-09 DIAGNOSIS — R079 Chest pain, unspecified: Secondary | ICD-10-CM

## 2016-01-09 DIAGNOSIS — E1143 Type 2 diabetes mellitus with diabetic autonomic (poly)neuropathy: Secondary | ICD-10-CM | POA: Insufficient documentation

## 2016-01-09 LAB — I-STAT CHEM 8, ED
BUN: 25 mg/dL — ABNORMAL HIGH (ref 6–20)
Calcium, Ion: 1.16 mmol/L (ref 1.15–1.40)
Chloride: 102 mmol/L (ref 101–111)
Creatinine, Ser: 1.2 mg/dL — ABNORMAL HIGH (ref 0.44–1.00)
GLUCOSE: 256 mg/dL — AB (ref 65–99)
HEMATOCRIT: 34 % — AB (ref 36.0–46.0)
HEMOGLOBIN: 11.6 g/dL — AB (ref 12.0–15.0)
POTASSIUM: 3.8 mmol/L (ref 3.5–5.1)
Sodium: 138 mmol/L (ref 135–145)
TCO2: 25 mmol/L (ref 0–100)

## 2016-01-09 LAB — DIFFERENTIAL
BASOS PCT: 0 %
Basophils Absolute: 0 10*3/uL (ref 0.0–0.1)
EOS PCT: 1 %
Eosinophils Absolute: 0.2 10*3/uL (ref 0.0–0.7)
LYMPHS PCT: 20 %
Lymphs Abs: 3 10*3/uL (ref 0.7–4.0)
MONO ABS: 1.2 10*3/uL — AB (ref 0.1–1.0)
MONOS PCT: 8 %
NEUTROS PCT: 71 %
Neutro Abs: 10.7 10*3/uL — ABNORMAL HIGH (ref 1.7–7.7)

## 2016-01-09 LAB — COMPREHENSIVE METABOLIC PANEL
ALBUMIN: 3.5 g/dL (ref 3.5–5.0)
ALK PHOS: 48 U/L (ref 38–126)
ALT: 9 U/L — AB (ref 14–54)
AST: 19 U/L (ref 15–41)
Anion gap: 8 (ref 5–15)
BILIRUBIN TOTAL: 0.3 mg/dL (ref 0.3–1.2)
BUN: 19 mg/dL (ref 6–20)
CO2: 23 mmol/L (ref 22–32)
CREATININE: 1.23 mg/dL — AB (ref 0.44–1.00)
Calcium: 9.4 mg/dL (ref 8.9–10.3)
Chloride: 103 mmol/L (ref 101–111)
GFR calc Af Amer: >60 mL/min (ref >60)
GFR, EST NON AFRICAN AMERICAN: 53 mL/min — AB (ref >60)
GLUCOSE: 262 mg/dL — AB (ref 65–99)
POTASSIUM: 3.7 mmol/L (ref 3.5–5.1)
Sodium: 134 mmol/L — ABNORMAL LOW (ref 135–145)
Total Protein: 8.1 g/dL (ref 6.5–8.1)

## 2016-01-09 LAB — URINALYSIS, ROUTINE W REFLEX MICROSCOPIC
GLUCOSE, UA: NEGATIVE mg/dL
Hgb urine dipstick: NEGATIVE
KETONES UR: NEGATIVE mg/dL
LEUKOCYTES UA: NEGATIVE
Nitrite: NEGATIVE
PH: 5.5 (ref 5.0–8.0)
PROTEIN: 30 mg/dL — AB
Specific Gravity, Urine: 1.028 (ref 1.005–1.030)

## 2016-01-09 LAB — CBC
HCT: 31.4 % — ABNORMAL LOW (ref 36.0–46.0)
HEMOGLOBIN: 9.6 g/dL — AB (ref 12.0–15.0)
MCH: 19.2 pg — AB (ref 26.0–34.0)
MCHC: 30.6 g/dL (ref 30.0–36.0)
MCV: 62.8 fL — AB (ref 78.0–100.0)
Platelets: 611 10*3/uL — ABNORMAL HIGH (ref 150–400)
RBC: 5 MIL/uL (ref 3.87–5.11)
RDW: 19.4 % — ABNORMAL HIGH (ref 11.5–15.5)
WBC: 15.1 10*3/uL — ABNORMAL HIGH (ref 4.0–10.5)

## 2016-01-09 LAB — LIPASE, BLOOD: LIPASE: 32 U/L (ref 11–51)

## 2016-01-09 LAB — PROTIME-INR
INR: 1.1
Prothrombin Time: 14.3 seconds (ref 11.4–15.2)

## 2016-01-09 LAB — URINE MICROSCOPIC-ADD ON
BACTERIA UA: NONE SEEN
RBC / HPF: NONE SEEN RBC/hpf (ref 0–5)

## 2016-01-09 LAB — I-STAT CG4 LACTIC ACID, ED
LACTIC ACID, VENOUS: 1.26 mmol/L (ref 0.5–1.9)
Lactic Acid, Venous: 1.08 mmol/L (ref 0.5–1.9)

## 2016-01-09 LAB — APTT: aPTT: 24 seconds (ref 24–36)

## 2016-01-09 LAB — I-STAT TROPONIN, ED
TROPONIN I, POC: 0 ng/mL (ref 0.00–0.08)
Troponin i, poc: 0 ng/mL (ref 0.00–0.08)

## 2016-01-09 LAB — PREGNANCY, URINE: PREG TEST UR: NEGATIVE

## 2016-01-09 LAB — CBG MONITORING, ED: GLUCOSE-CAPILLARY: 239 mg/dL — AB (ref 65–99)

## 2016-01-09 MED ORDER — HALOPERIDOL LACTATE 5 MG/ML IJ SOLN
2.0000 mg | Freq: Once | INTRAMUSCULAR | Status: AC
  Administered 2016-01-09: 2 mg via INTRAVENOUS
  Filled 2016-01-09: qty 1

## 2016-01-09 MED ORDER — SODIUM CHLORIDE 0.9 % IV BOLUS (SEPSIS)
1000.0000 mL | Freq: Once | INTRAVENOUS | Status: AC
  Administered 2016-01-09: 1000 mL via INTRAVENOUS

## 2016-01-09 MED ORDER — DIPHENHYDRAMINE HCL 50 MG/ML IJ SOLN
12.5000 mg | Freq: Once | INTRAMUSCULAR | Status: AC
  Administered 2016-01-09: 12.5 mg via INTRAVENOUS
  Filled 2016-01-09: qty 1

## 2016-01-09 MED ORDER — METOCLOPRAMIDE HCL 5 MG/ML IJ SOLN
10.0000 mg | Freq: Once | INTRAMUSCULAR | Status: AC
  Administered 2016-01-09: 10 mg via INTRAVENOUS
  Filled 2016-01-09: qty 2

## 2016-01-09 MED ORDER — ONDANSETRON HCL 4 MG/2ML IJ SOLN
4.0000 mg | Freq: Once | INTRAMUSCULAR | Status: AC
  Administered 2016-01-09: 4 mg via INTRAVENOUS
  Filled 2016-01-09: qty 2

## 2016-01-09 MED ORDER — KETOROLAC TROMETHAMINE 30 MG/ML IJ SOLN: 30.0000 mg | Freq: Once | INTRAMUSCULAR | Status: DC

## 2016-01-09 MED ORDER — METOCLOPRAMIDE HCL 10 MG PO TABS: 10.0000 mg | ORAL_TABLET | Freq: Three times a day (TID) | ORAL | 0 refills | Status: DC | PRN

## 2016-01-09 MED ORDER — FENTANYL CITRATE (PF) 100 MCG/2ML IJ SOLN: 50.0000 ug | Freq: Once | INTRAMUSCULAR | Status: DC

## 2016-01-09 NOTE — ED Notes (Signed)
Patient states she hasn't taken diabetes or heart medications in approximately 6 months stating she is unable to afford them.

## 2016-01-09 NOTE — Discharge Instructions (Signed)
Continue taking her medications as prescribed. Take your Reglan as prescribed as needed for nausea. I recommend drinking fluids to continue to remain hydrated at home. Eat bland diet for the next to the consider symptoms have improved. Call your primary care provider's office tomorrow morning to schedule a follow-up appointment in the next 1-2 days for further management and reevaluation regarding her chronic abdominal pain and gastroparesis. Please return to the Emergency Department if symptoms worsen or new onset of fever, vomiting, unable to keep fluids down, vomiting blood, new/worsening abdominal pain.

## 2016-01-09 NOTE — ED Provider Notes (Signed)
Hand-off from [employer], PA-C. Pending UA, pregnancy and delta trop.   See note from initial provider for full HPI.  Briefly, pt is a 40 yo female with PMH of CAD, CVA, depression, DM, chronic abdominal pain and gastroparesis who presents with abdominal pain, chest pain, N/V and left sided tingling for the past 5 days. Pt reports abdominal/CP feels similar to her typical pain associated with gastroparesis.   Physical Exam  BP 130/80 (BP Location: Right Arm)   Pulse 78   Temp 98.5 F (36.9 C) (Oral)   Resp 20   Ht 5\' 7"  (1.702 m)   Wt 90.7 kg   LMP 01/09/2016 (Approximate)   SpO2 100%   BMI 31.32 kg/m   Physical Exam  Constitutional: She is oriented to person, place, and time. She appears well-developed and well-nourished. No distress.  HENT:  Head: Normocephalic and atraumatic.  Mouth/Throat: Oropharynx is clear and moist. No oropharyngeal exudate.  Eyes: Conjunctivae and EOM are normal. Right eye exhibits no discharge. Left eye exhibits no discharge. No scleral icterus.  Neck: Normal range of motion. Neck supple.  Cardiovascular: Normal rate, regular rhythm, normal heart sounds and intact distal pulses.   HR 90  Pulmonary/Chest: Effort normal and breath sounds normal. No respiratory distress. She has no wheezes. She has no rales. She exhibits no tenderness.  Abdominal: Soft. Bowel sounds are normal. She exhibits no distension and no mass. There is no tenderness. There is no rebound and no guarding. No hernia.  Musculoskeletal: Normal range of motion. She exhibits no edema or tenderness.  Full range of motion of bilateral upper and lower extremities, with 5/5 strength. Sensation intact. 2+ radial and PT pulses. Cap refill <2 seconds.   Neurological: She is alert and oriented to person, place, and time.  Skin: Skin is warm and dry. Capillary refill takes less than 2 seconds. She is not diaphoretic.  Nursing note and vitals reviewed.   ED Course  Procedures  MDM Pt  presents with N/V, abdominal pain consistent with typical episodes of gastroparesis. Pt reports being noncompliant with medications. Initial vitals showed HR 108, remaining vitals stable. Exam performed by initial provider showed tachycardia and LLQ abdominal tenderness, no peritoneal signs. Pt denied any tingling on exam. Pt given IVF, zofran, benadryl and reglan.   EKG showed sinus tachycardia with no acute ischemic changes. CXR negative. Trop negative. CT head negative. Na 134, Cr 1.23, BUN 25. Glucose 262, no AG.  WBC 15.1, chart review shows pt with chronic leukocytosis with WBC ranging 12-20. Remaining labs unremarkable. Pt given haldol due to continued abdominal pain and nausea' continued to deny CP or tingling. Reevaluation pt feeling better and drinking and eating. Pending UA, delta trop and Upreg. Plan to reevaluate pt, if pending labs unremarkable and pt tolerating PO, plan to d/c pt home with antiemetics and outpatient follow up.   On my initial evaluation, patient is resting comfortably and reports improvement of abdominal pain, nausea. Denies any episodes of vomiting after having something to eat and drink in the ED. Patient denies any chest pain or tingling at this time. My initial examination was unremarkable, no abdominal tenderness. VSS. Pregnancy negative. UA without signs of infection. Delta troponin negative. Discussed results and plan for discharge with patient. Plan to discharge patient home with Reglan and PCP follow-up. Discussed return precautions with patient.      Chesley Noon [place], [place] 01/09/16 [place], MD 01/10/16 [phone]

## 2016-01-09 NOTE — ED Triage Notes (Signed)
Per pT, Pt is coming from home with complaints of nausea, vomiting, chest pain, weakness, SOB, dizziness, and left sided tingling starting on Tuesday night. Pt reports getting worse. Hx of MI and Stroke.

## 2016-01-09 NOTE — ED Provider Notes (Signed)
[facility] DEPT Provider Note   CSN: [id number] Arrival date & time: 01/09/16  1233     History   Chief Complaint Chief Complaint  Patient presents with  . Chest Pain  . Numbness    HPI Shirley [name] is a 40 y.o. female.  HPI   HPI Shirley [name] is a 40 y.o. female.   HPI Shirley [name] is a 40 y.o. female with history of coronary disease, CVA, depression, diabetes, chronic abdominal pain, gastroparesis, presents to emergency department complaining of dizziness, nausea, vomiting, chest pain, abdominal pain, left-sided tingling for the last 5 days. Patient states she does not have insurance at this time and has not been taking any of her medications for last 6 months. She states she is throwing up dark colored emesis. States normal bowel movement yesterday. Denies any urinary symptoms. Denies any back pain. Denies any fever or chills. Has not tried any medications for this complaint at home. Nothing is making her feel better or worse.       Past Medical History:  Diagnosis Date  . Abscess of tunica vaginalis      10/09- Abundant S. aureus- sensitive to all abx  . Anxiety    . Blood dyscrasia    . 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 Cath 12/10- no restenosis or significant CAD progression  . CVA (cerebral vascular accident) ([place]) ~ 02/2014    denies residual on 04/22/2014  . CVA (cerebral vascular accident) [facility])      history of remote right cerebellar infarct noted on head CT at least since 10/2011  . Depression    . Diabetes mellitus type 2, uncontrolled, with complications ([place])    . Fibromyalgia    . Gastritis    . Gastroparesis      secondary to poorly controlled DM, last emptying study performed 01/2010  was normal but may be falsely positive as pt was on reglan  . GERD (gastroesophageal reflux disease)    . Hepatitis B, chronic (HCC)       Hep BeAb+,Hep B cAb+ & Hep BsAg+ (9/06)  . History of pyelonephritis      H/o GrpB Pyelonephritis (9/06) and UTI- 07/11- E.Coli, 12/10- GBS  . Hyperlipidemia    . Hypertension    . Iron deficiency anemia    . Irregular menses      Small ovarian follicles seen on 99991111)  . MI (myocardial infarction) ([place]) 05/2006    PDA percutaneous transluminal coronary angioplasty  . Migraine      "weekly" (04/22/2014)  . N&V (nausea and vomiting)      Chronic. Unclear etiology with multiple admission and ED visits. CT abdomen with and without contrast (02/2011)  showed no acute process. Gastic Emptying scan (01/2010) was normal. Ultrasound of the abdomen was within normal limits. Hepatitis B viral load was undectable. HIV NR. EGD - gastritis, Hpylori + s/p Rx  . Obesity    . OSA (obstructive sleep apnea)      "suppose to wear mask but I don't" (04/22/2014)  . Peripheral neuropathy ([place])    . Pneumonia      "this is probably the 2nd or 3rd time I've had pneumonia" (04/22/2014)  . Recurrent boils    . Seasonal asthma    . Substance abuse    . Thrombocytosis ([place])      Hem/Onc suggested 2/2 chronic hepatits  and/or iron deficiency anemia          Patient Active Problem List    Diagnosis Date Noted  . Non-intractable cyclical vomiting with nausea    . Dehydration 03/27/2015  . Intractable nausea and vomiting 11/11/2014  . Gastroparesis 11/11/2014  . Cough    . Hematemesis 10/13/2014  . DKA (diabetic ketoacidoses) ([place]) 10/13/2014  . Increased anion gap metabolic acidosis 0000000  . Tachycardia 07/21/2014  . Abdominal pain, chronic, left lower quadrant    . Nausea with vomiting    . Diabetic gastroparesis associated with type 2 diabetes mellitus ([place]) 04/28/2014  . History of Helicobacter pylori infection 04/22/2014  . Vaginal discharge 02/18/2014  . Atypical chest pain 07/22/2013  . Unspecified constipation 07/21/2013  . Tinea corporis 07/21/2013  . Intractable vomiting 04/29/2013   . Dysmenorrhea 04/22/2013  . Headache(784.0) 02/08/2012  . Health care maintenance 01/22/2012  . Chronic hepatitis B ([place]) 03/07/2011  . History of leukocytosis 04/06/2010  . THROMBOCYTOSIS 04/06/2010  . Polysubstance abuse 02/23/2010  . Iron deficiency anemia 11/22/2009  . PERIPHERAL NEUROPATHY 10/01/2009  . Hyperlipidemia 08/30/2009  . POLYNEUROPATHY, DIABETIC 08/30/2009  . Hidradenitis (recurrent boils) 07/07/2008  . Depression 12/27/2007  . Abdominal pain, left lower quadrant 11/21/2007  . FIBROMYALGIA 10/30/2007  . BACK PAIN 04/01/2007  . OBSTRUCTIVE SLEEP APNEA 01/17/2007  . ANXIETY DEPRESSION 06/27/2006  . Chronic ischemic heart disease 06/15/2006  . OBESITY, MORBID 05/15/2006  . MIGRAINE HEADACHE 05/15/2006  . Asthma 05/15/2006  . Essential hypertension 01/16/2006  . IRREGULAR MENSTRUATION 01/16/2006  . PEDAL EDEMA 01/16/2006  . Poorly controlled type 2 diabetes mellitus with peripheral neuropathy ([place]) 01/16/1989           Past Surgical History:  Procedure Laterality Date  . CESAREAN SECTION   1997  . CORONARY ANGIOPLASTY WITH STENT PLACEMENT   2008    "2 stents"  . ESOPHAGOGASTRODUODENOSCOPY N/A 04/23/2014    Procedure: ESOPHAGOGASTRODUODENOSCOPY (EGD);  Surgeon: Winfield Cunas., MD;  Location[facility] ENDOSCOPY;  Service: Endoscopy;  Laterality: N/A;         OB History     No data available             Allergies   Eggs or egg-derived products; Lisinopril; and Morphine and related   Review of Systems Review of Systems   Review of Systems  Constitutional: Negative for chills and fever.  Respiratory: Positive for cough, chest tightness and shortness of breath.   Cardiovascular: Positive for chest pain. Negative for palpitations and leg swelling.  Gastrointestinal: Positive for abdominal pain, nausea and vomiting. Negative for blood in stool, constipation and diarrhea.  Genitourinary: Negative for dysuria, flank pain, pelvic pain, vaginal bleeding,  vaginal discharge and vaginal pain.  Musculoskeletal: Negative for arthralgias, myalgias, neck pain and neck stiffness.  Skin: Negative for rash.  Neurological: Positive for dizziness, light-headedness and headaches. Negative for weakness.  All other systems reviewed and are negative.     Physical Exam Updated Vital Signs BP 130/80 (BP Location: Right Arm)   Pulse 78   Temp 98.5 F (36.9 C) (Oral)   Resp 20   Ht 5\' 7"  (1.702 m)   Wt 90.7 kg   LMP 01/09/2016 (Approximate)   SpO2 100%   BMI 31.32 kg/m   Physical Exam  Physical Exam  Constitutional: She appears well-developed and well-nourished. No distress.  HENT:  Head: Normocephalic.  Eyes: Conjunctivae are normal.  Neck: Neck supple.  Cardiovascular: Regular rhythm and normal heart sounds.  tachycardic  Pulmonary/Chest: Effort normal and breath sounds normal. No respiratory distress. She has no wheezes. She has no rales.  Abdominal: Soft. Bowel sounds are normal. She exhibits no distension. There is tenderness. There is no rebound.  LLQ tenderness  Musculoskeletal: She exhibits no edema.  Neurological: She is alert.  Skin: Skin is warm and dry.  Psychiatric: She has a normal mood and affect. Her behavior is normal.  Nursing note and vitals reviewed.      ED Treatments / Results  Labs (all labs ordered are listed, but only abnormal results are displayed) Labs Reviewed  CBC - Abnormal; Notable for the following:       Result Value   WBC 15.1 (*)    Hemoglobin 9.6 (*)    HCT 31.4 (*)    MCV 62.8 (*)    MCH 19.2 (*)    RDW 19.4 (*)    Platelets 611 (*)    All other components within normal limits  DIFFERENTIAL - Abnormal; Notable for the following:    Neutro Abs 10.7 (*)    Monocytes Absolute 1.2 (*)    All other components within normal limits  COMPREHENSIVE METABOLIC PANEL - Abnormal; Notable for the following:    Sodium 134 (*)    Glucose, Bld 262 (*)    Creatinine, Ser 1.23 (*)    ALT 9 (*)    GFR  calc non Af Amer 53 (*)    All other components within normal limits  CBG MONITORING, ED - Abnormal; Notable for the following:    Glucose-Capillary 239 (*)    All other components within normal limits  I-STAT CHEM 8, ED - Abnormal; Notable for the following:    BUN 25 (*)    Creatinine, Ser 1.20 (*)    Glucose, Bld 256 (*)    Hemoglobin 11.6 (*)    HCT 34.0 (*)    All other components within normal limits  PROTIME-INR  APTT  LIPASE, BLOOD  URINALYSIS, ROUTINE W REFLEX MICROSCOPIC (NOT AT [facility])  PREGNANCY, URINE  I-STAT TROPOININ, ED  I-STAT TROPOININ, ED  I-STAT CG4 LACTIC ACID, ED  I-STAT CG4 LACTIC ACID, ED  I-STAT TROPOININ, ED    EKG  EKG Interpretation None       Radiology Dg Chest 2 View  Result Date: 01/09/2016 CLINICAL DATA:  Patient with nausea, vomiting, chest pain and shortness of breath. EXAM: CHEST  2 VIEW COMPARISON:  Chest radiograph 11/27/2015. FINDINGS: The heart size and mediastinal contours are within normal limits. Both lungs are clear. The visualized skeletal structures are unremarkable. IMPRESSION: No active cardiopulmonary disease. Electronically Signed   By: Lovey Newcomer M.D.   On: 01/09/2016 14:24   Ct Head Wo Contrast  Result Date: 01/09/2016 CLINICAL DATA:  Patient with nausea, vomiting and chest pain. EXAM: CT HEAD WITHOUT CONTRAST TECHNIQUE: Contiguous axial images were obtained from the base of the skull through the vertex without intravenous contrast. COMPARISON:  Brain CT 06/20/2012. FINDINGS: Brain: Ventricles and sulci are appropriate for patient's age. No evidence for acute cortically based infarct, intracranial hemorrhage, mass lesion or mass-effect. Encephalomalacia within the right cerebellar hemisphere. Vascular: Unremarkable Skull: Intact. Sinuses/Orbits: Mucosal thickening involving the ethmoid air cells. No air-fluid levels. Mastoid air cells are unremarkable. Other: None. IMPRESSION: No acute intracranial process. Old right cerebellar  infarct. Electronically Signed   By: Lovey Newcomer M.D.   On: 01/09/2016 14:27    Procedures Procedures (including critical care time)  Medications Ordered in ED Medications  sodium chloride 0.9 % bolus 1,000 mL (1,000 mLs Intravenous New Bag/Given 01/09/16 1606)  metoCLOPramide (REGLAN) injection 10 mg (10 mg Intravenous Given 01/09/16 1432)  diphenhydrAMINE (BENADRYL) injection 12.5 mg (12.5 mg Intravenous Given 01/09/16 1435)  ondansetron (ZOFRAN) injection 4 mg (4 mg Intravenous Given 01/09/16 1431)  sodium chloride 0.9 % bolus 1,000 mL (0 mLs Intravenous Stopped 01/09/16 1606)  haloperidol lactate (HALDOL) injection 2 mg (2 mg Intravenous Given 01/09/16 1605)     Initial Impression / Assessment and Plan / ED Course   I have reviewed the triage vital signs and the nursing notes.    Pertinent labs & imaging results that were available during my care of the patient were reviewed by me and considered in my medical decision making (see chart for details).   Clinical Course    1:22 PM  Patient seen and examined. Patient multiple prior visits for cyclical nausea and vomiting, most likely from gastroparesis. Patient is noncompliant with medications. History of polysubstance abuse. Also history of coronary disease and reported CVA. I do not see any MRIs in the system or care everywhere to confirm prior CVA. Will check labs, start IV fluids, medications for nausea and vomiting ordered. Abdomen is soft.  4:58 PM Pt feels much better after haldol. She is drinking and eating crackers. UA pending. Delta trop negative. Pt denies CP or numbness at this time. CT head was negative. Signed out at shift change pending UA. If negative, I believe it is reasonable to let her go home with anti emetics. Follow up with pcp.     Final Clinical Impressions(s) / ED Diagnoses   Final diagnoses:  Gastroparesis  Chest pain, unspecified chest pain type    New Prescriptions New Prescriptions   No medications on  file     Jeannett Senior, PA-C 01/10/16 [place], MD 01/11/16 [phone]

## 2016-01-14 ENCOUNTER — Encounter: Payer: Self-pay | Admitting: Internal Medicine

## 2016-01-14 ENCOUNTER — Ambulatory Visit: Payer: Medicaid Other

## 2016-01-19 ENCOUNTER — Emergency Department (HOSPITAL_COMMUNITY): Payer: Medicaid Other

## 2016-01-19 ENCOUNTER — Encounter (HOSPITAL_COMMUNITY): Payer: Self-pay

## 2016-01-19 DIAGNOSIS — E1143 Type 2 diabetes mellitus with diabetic autonomic (poly)neuropathy: Principal | ICD-10-CM | POA: Diagnosis present

## 2016-01-19 DIAGNOSIS — E669 Obesity, unspecified: Secondary | ICD-10-CM | POA: Diagnosis present

## 2016-01-19 DIAGNOSIS — K3184 Gastroparesis: Secondary | ICD-10-CM | POA: Diagnosis present

## 2016-01-19 DIAGNOSIS — K2960 Other gastritis without bleeding: Secondary | ICD-10-CM | POA: Diagnosis present

## 2016-01-19 DIAGNOSIS — Z794 Long term (current) use of insulin: Secondary | ICD-10-CM

## 2016-01-19 DIAGNOSIS — Z87891 Personal history of nicotine dependence: Secondary | ICD-10-CM

## 2016-01-19 DIAGNOSIS — I2510 Atherosclerotic heart disease of native coronary artery without angina pectoris: Secondary | ICD-10-CM | POA: Diagnosis present

## 2016-01-19 DIAGNOSIS — Z9119 Patient's noncompliance with other medical treatment and regimen: Secondary | ICD-10-CM

## 2016-01-19 DIAGNOSIS — M797 Fibromyalgia: Secondary | ICD-10-CM | POA: Diagnosis present

## 2016-01-19 DIAGNOSIS — D509 Iron deficiency anemia, unspecified: Secondary | ICD-10-CM | POA: Diagnosis present

## 2016-01-19 DIAGNOSIS — Z79899 Other long term (current) drug therapy: Secondary | ICD-10-CM

## 2016-01-19 DIAGNOSIS — G4733 Obstructive sleep apnea (adult) (pediatric): Secondary | ICD-10-CM | POA: Diagnosis present

## 2016-01-19 DIAGNOSIS — E1165 Type 2 diabetes mellitus with hyperglycemia: Secondary | ICD-10-CM | POA: Diagnosis present

## 2016-01-19 DIAGNOSIS — Z7982 Long term (current) use of aspirin: Secondary | ICD-10-CM

## 2016-01-19 DIAGNOSIS — Z955 Presence of coronary angioplasty implant and graft: Secondary | ICD-10-CM

## 2016-01-19 DIAGNOSIS — Z6831 Body mass index (BMI) 31.0-31.9, adult: Secondary | ICD-10-CM

## 2016-01-19 DIAGNOSIS — E785 Hyperlipidemia, unspecified: Secondary | ICD-10-CM | POA: Diagnosis present

## 2016-01-19 DIAGNOSIS — K219 Gastro-esophageal reflux disease without esophagitis: Secondary | ICD-10-CM | POA: Diagnosis present

## 2016-01-19 DIAGNOSIS — Z8673 Personal history of transient ischemic attack (TIA), and cerebral infarction without residual deficits: Secondary | ICD-10-CM

## 2016-01-19 DIAGNOSIS — I252 Old myocardial infarction: Secondary | ICD-10-CM

## 2016-01-19 DIAGNOSIS — I10 Essential (primary) hypertension: Secondary | ICD-10-CM | POA: Diagnosis present

## 2016-01-19 LAB — CBC
HEMATOCRIT: 30 % — AB (ref 36.0–46.0)
HEMOGLOBIN: 9.2 g/dL — AB (ref 12.0–15.0)
MCH: 19.2 pg — AB (ref 26.0–34.0)
MCHC: 30.7 g/dL (ref 30.0–36.0)
MCV: 62.8 fL — AB (ref 78.0–100.0)
Platelets: 460 10*3/uL — ABNORMAL HIGH (ref 150–400)
RBC: 4.78 MIL/uL (ref 3.87–5.11)
RDW: 19.5 % — ABNORMAL HIGH (ref 11.5–15.5)
WBC: 16.1 10*3/uL — ABNORMAL HIGH (ref 4.0–10.5)

## 2016-01-19 LAB — BASIC METABOLIC PANEL
Anion gap: 11 (ref 5–15)
BUN: 18 mg/dL (ref 6–20)
CHLORIDE: 106 mmol/L (ref 101–111)
CO2: 22 mmol/L (ref 22–32)
CREATININE: 1.09 mg/dL — AB (ref 0.44–1.00)
Calcium: 9.7 mg/dL (ref 8.9–10.3)
GFR calc non Af Amer: >60 mL/min (ref >60)
Glucose, Bld: 186 mg/dL — ABNORMAL HIGH (ref 65–99)
POTASSIUM: 3.7 mmol/L (ref 3.5–5.1)
Sodium: 139 mmol/L (ref 135–145)

## 2016-01-19 NOTE — ED Triage Notes (Signed)
Pt comes via [facility], has lower abd pain that started last night with n/v, then today R sided CP.

## 2016-01-20 ENCOUNTER — Inpatient Hospital Stay (HOSPITAL_COMMUNITY)
Admission: EM | Admit: 2016-01-20 | Discharge: 2016-01-25 | DRG: 074 | Disposition: A | Discharge status: Home / Self Care | Payer: Self-pay | Attending: Internal Medicine | Admitting: Internal Medicine

## 2016-01-20 DIAGNOSIS — R52 Pain, unspecified: Secondary | ICD-10-CM

## 2016-01-20 DIAGNOSIS — Z7982 Long term (current) use of aspirin: Secondary | ICD-10-CM

## 2016-01-20 DIAGNOSIS — Z888 Allergy status to other drugs, medicaments and biological substances status: Secondary | ICD-10-CM

## 2016-01-20 DIAGNOSIS — R1110 Vomiting, unspecified: Secondary | ICD-10-CM | POA: Diagnosis present

## 2016-01-20 DIAGNOSIS — Z91012 Allergy to eggs: Secondary | ICD-10-CM

## 2016-01-20 DIAGNOSIS — K3184 Gastroparesis: Secondary | ICD-10-CM

## 2016-01-20 DIAGNOSIS — Z885 Allergy status to narcotic agent status: Secondary | ICD-10-CM

## 2016-01-20 LAB — GLUCOSE, CAPILLARY
GLUCOSE-CAPILLARY: 176 mg/dL — AB (ref 65–99)
GLUCOSE-CAPILLARY: 194 mg/dL — AB (ref 65–99)
Glucose-Capillary: 150 mg/dL — ABNORMAL HIGH (ref 65–99)
Glucose-Capillary: 138 mg/dL — ABNORMAL HIGH (ref 65–99)

## 2016-01-20 LAB — COMPREHENSIVE METABOLIC PANEL
ALT: 8 U/L — AB (ref 14–54)
AST: 16 U/L (ref 15–41)
Albumin: 3.4 g/dL — ABNORMAL LOW (ref 3.5–5.0)
Alkaline Phosphatase: 50 U/L (ref 38–126)
Anion gap: 10 (ref 5–15)
BILIRUBIN TOTAL: 0.6 mg/dL (ref 0.3–1.2)
BUN: 14 mg/dL (ref 6–20)
CO2: 23 mmol/L (ref 22–32)
CREATININE: 0.89 mg/dL (ref 0.44–1.00)
Calcium: 9.1 mg/dL (ref 8.9–10.3)
Chloride: 103 mmol/L (ref 101–111)
GFR calc Af Amer: >60 mL/min (ref >60)
Glucose, Bld: 186 mg/dL — ABNORMAL HIGH (ref 65–99)
POTASSIUM: 3.7 mmol/L (ref 3.5–5.1)
Sodium: 136 mmol/L (ref 135–145)
TOTAL PROTEIN: 7.7 g/dL (ref 6.5–8.1)

## 2016-01-20 LAB — RAPID URINE DRUG SCREEN, HOSP PERFORMED
Amphetamines: NOT DETECTED
BARBITURATES: NOT DETECTED
Benzodiazepines: NOT DETECTED
COCAINE: NOT DETECTED
OPIATES: NOT DETECTED
Tetrahydrocannabinol: NOT DETECTED

## 2016-01-20 LAB — CBC WITH DIFFERENTIAL/PLATELET
BASOS PCT: 0 %
Basophils Absolute: 0 10*3/uL (ref 0.0–0.1)
EOS PCT: 0 %
Eosinophils Absolute: 0 10*3/uL (ref 0.0–0.7)
HEMATOCRIT: 29.7 % — AB (ref 36.0–46.0)
Hemoglobin: 9.2 g/dL — ABNORMAL LOW (ref 12.0–15.0)
LYMPHS ABS: 1.6 10*3/uL (ref 0.7–4.0)
Lymphocytes Relative: 10 %
MCH: 19.3 pg — AB (ref 26.0–34.0)
MCHC: 31 g/dL (ref 30.0–36.0)
MCV: 62.4 fL — AB (ref 78.0–100.0)
MONOS PCT: 2 %
Monocytes Absolute: 0.3 10*3/uL (ref 0.1–1.0)
Neutro Abs: 13.6 10*3/uL — ABNORMAL HIGH (ref 1.7–7.7)
Neutrophils Relative %: 88 %
Platelets: 424 10*3/uL — ABNORMAL HIGH (ref 150–400)
RBC: 4.76 MIL/uL (ref 3.87–5.11)
RDW: 19.3 % — AB (ref 11.5–15.5)
WBC: 15.5 10*3/uL — AB (ref 4.0–10.5)

## 2016-01-20 LAB — URINALYSIS, ROUTINE W REFLEX MICROSCOPIC
Bilirubin Urine: NEGATIVE
GLUCOSE, UA: 250 mg/dL — AB
HGB URINE DIPSTICK: NEGATIVE
Ketones, ur: 15 mg/dL — AB
Leukocytes, UA: NEGATIVE
NITRITE: NEGATIVE
PH: 6 (ref 5.0–8.0)
Protein, ur: 30 mg/dL — AB
SPECIFIC GRAVITY, URINE: 1.026 (ref 1.005–1.030)

## 2016-01-20 LAB — URINE MICROSCOPIC-ADD ON: RBC / HPF: NONE SEEN RBC/hpf (ref 0–5)

## 2016-01-20 LAB — FERRITIN: FERRITIN: 7 ng/mL — AB (ref 11–307)

## 2016-01-20 MED ORDER — ATORVASTATIN CALCIUM 80 MG PO TABS
80.0000 mg | ORAL_TABLET | Freq: Every day | ORAL | Status: DC
  Administered 2016-01-20 – 2016-01-24 (×4): 80 mg via ORAL
  Filled 2016-01-20 (×6): qty 1

## 2016-01-20 MED ORDER — DIPHENHYDRAMINE HCL 50 MG/ML IJ SOLN
25.0000 mg | Freq: Once | INTRAMUSCULAR | Status: AC
  Administered 2016-01-20: 25 mg via INTRAVENOUS
  Filled 2016-01-20: qty 1

## 2016-01-20 MED ORDER — INSULIN ASPART 100 UNIT/ML ~~LOC~~ SOLN
0.0000 [IU] | Freq: Three times a day (TID) | SUBCUTANEOUS | Status: DC
  Administered 2016-01-21 (×2): 2 [IU] via SUBCUTANEOUS
  Administered 2016-01-21 – 2016-01-22 (×2): 3 [IU] via SUBCUTANEOUS
  Administered 2016-01-24: 1 [IU] via SUBCUTANEOUS
  Administered 2016-01-24: 5 [IU] via SUBCUTANEOUS
  Administered 2016-01-25 (×2): 3 [IU] via SUBCUTANEOUS
  Administered 2016-01-25: 5 [IU] via SUBCUTANEOUS

## 2016-01-20 MED ORDER — ENOXAPARIN SODIUM 40 MG/0.4ML ~~LOC~~ SOLN
40.0000 mg | SUBCUTANEOUS | Status: DC
  Administered 2016-01-20 – 2016-01-24 (×5): 40 mg via SUBCUTANEOUS
  Filled 2016-01-20 (×6): qty 0.4

## 2016-01-20 MED ORDER — ALBUTEROL SULFATE (2.5 MG/3ML) 0.083% IN NEBU: 3.0000 mL | INHALATION_SOLUTION | RESPIRATORY_TRACT | Status: DC | PRN

## 2016-01-20 MED ORDER — SODIUM CHLORIDE 0.9 % IV BOLUS (SEPSIS): 1000.0000 mL | Freq: Once | INTRAVENOUS | Status: DC

## 2016-01-20 MED ORDER — PROMETHAZINE HCL 25 MG PO TABS: 25.0000 mg | ORAL_TABLET | Freq: Four times a day (QID) | ORAL | Status: DC | PRN

## 2016-01-20 MED ORDER — HALOPERIDOL LACTATE 5 MG/ML IJ SOLN
2.0000 mg | Freq: Once | INTRAMUSCULAR | Status: AC
  Administered 2016-01-20: 2 mg via INTRAVENOUS
  Filled 2016-01-20: qty 1

## 2016-01-20 MED ORDER — ACETAMINOPHEN 650 MG RE SUPP: 650.0000 mg | Freq: Four times a day (QID) | RECTAL | Status: DC | PRN

## 2016-01-20 MED ORDER — METOCLOPRAMIDE HCL 5 MG/ML IJ SOLN
10.0000 mg | Freq: Three times a day (TID) | INTRAMUSCULAR | Status: DC
  Administered 2016-01-20 – 2016-01-25 (×15): 10 mg via INTRAVENOUS
  Filled 2016-01-20 (×15): qty 2

## 2016-01-20 MED ORDER — PROMETHAZINE HCL 25 MG/ML IJ SOLN
12.5000 mg | Freq: Four times a day (QID) | INTRAMUSCULAR | Status: DC | PRN
  Administered 2016-01-21 – 2016-01-24 (×7): 12.5 mg via INTRAVENOUS
  Filled 2016-01-20 (×7): qty 1

## 2016-01-20 MED ORDER — ONDANSETRON HCL 4 MG/2ML IJ SOLN
4.0000 mg | Freq: Once | INTRAMUSCULAR | Status: AC
  Administered 2016-01-20: 4 mg via INTRAVENOUS
  Filled 2016-01-20: qty 2

## 2016-01-20 MED ORDER — ACETAMINOPHEN 325 MG PO TABS
650.0000 mg | ORAL_TABLET | Freq: Four times a day (QID) | ORAL | Status: DC | PRN
  Administered 2016-01-20 – 2016-01-24 (×5): 650 mg via ORAL
  Filled 2016-01-20 (×5): qty 2

## 2016-01-20 MED ORDER — PROMETHAZINE HCL 25 MG RE SUPP
12.5000 mg | Freq: Four times a day (QID) | RECTAL | Status: DC | PRN
  Filled 2016-01-20: qty 1

## 2016-01-20 MED ORDER — LOSARTAN POTASSIUM 50 MG PO TABS: 25.0000 mg | ORAL_TABLET | Freq: Every day | ORAL | Status: DC

## 2016-01-20 MED ORDER — FERROUS SULFATE 325 (65 FE) MG PO TABS
325.0000 mg | ORAL_TABLET | Freq: Every day | ORAL | Status: DC
Start: 2016-01-20 — End: 2016-01-25
  Administered 2016-01-20 – 2016-01-25 (×6): 325 mg via ORAL
  Filled 2016-01-20 (×6): qty 1

## 2016-01-20 MED ORDER — SODIUM CHLORIDE 0.9 % IV BOLUS (SEPSIS)
1000.0000 mL | Freq: Once | INTRAVENOUS | Status: AC
  Administered 2016-01-20: 1000 mL via INTRAVENOUS

## 2016-01-20 MED ORDER — METOCLOPRAMIDE HCL 5 MG/ML IJ SOLN
10.0000 mg | Freq: Once | INTRAMUSCULAR | Status: AC
  Administered 2016-01-20: 10 mg via INTRAVENOUS
  Filled 2016-01-20: qty 2

## 2016-01-20 MED ORDER — METOCLOPRAMIDE HCL 10 MG PO TABS: 10.0000 mg | ORAL_TABLET | Freq: Three times a day (TID) | ORAL | Status: DC | PRN

## 2016-01-20 MED ORDER — PANTOPRAZOLE SODIUM 40 MG PO TBEC
40.0000 mg | DELAYED_RELEASE_TABLET | Freq: Every day | ORAL | Status: DC
  Administered 2016-01-20 – 2016-01-25 (×6): 40 mg via ORAL
  Filled 2016-01-20 (×6): qty 1

## 2016-01-20 MED ORDER — PROMETHAZINE HCL 25 MG PO TABS
25.0000 mg | ORAL_TABLET | Freq: Four times a day (QID) | ORAL | Status: DC | PRN
  Administered 2016-01-21: 25 mg via ORAL
  Filled 2016-01-20: qty 1

## 2016-01-20 MED ORDER — LOSARTAN POTASSIUM 50 MG PO TABS
25.0000 mg | ORAL_TABLET | Freq: Every day | ORAL | Status: DC
  Administered 2016-01-20 – 2016-01-25 (×6): 25 mg via ORAL
  Filled 2016-01-20 (×6): qty 1

## 2016-01-20 MED ORDER — SODIUM CHLORIDE 0.9 % IV SOLN: INTRAVENOUS | Status: AC

## 2016-01-20 NOTE — ED Provider Notes (Signed)
[facility] DEPT Provider Note   CSN: [id number] Arrival date & time: 01/19/16  2247  By signing my name below, I, Johnney Killian, attest that this documentation has been prepared under the direction and in the presence of Orpah Greek, MD. Electronically Signed: Johnney Killian, ED Scribe. 01/20/16. 5:08 AM.   History   Chief Complaint Chief Complaint  Patient presents with  . Chest Pain  . Emesis    HPI Comments: Shirley [name] is a 40 y.o. female who presents to the Emergency Department complaining of emesis since yesterday. Pt says she has been vomiting all of yesterday and overnight. She says she has pain in her lower abdomen. Pt says she takes home medications for emesis. She says she has experienced previous episodes of these symptoms. Reports she has been diagnosed with DM. She denies diarrhea and SOB.  The history is provided by the patient. No language interpreter was used.    Past Medical History:  Diagnosis Date  . Abscess of tunica vaginalis    10/09- Abundant S. aureus- sensitive to all abx  . Anxiety   . Blood dyscrasia   . CAD (coronary artery disease) 06/15/2006   s/p Subendocardial MI with PDA angioplasty(no stent) on 06/15/06 and relook  cath 06/19/06 showed patency of site. Cath 12/10- no restenosis or significant CAD progression  . CVA (cerebral vascular accident) ([place]) ~ 02/2014   denies residual on 04/22/2014  . CVA (cerebral vascular accident) [facility])    history of remote right cerebellar infarct noted on head CT at least since 10/2011  . Depression   . Diabetes mellitus type 2, uncontrolled, with complications ([place])   . Fibromyalgia   . Gastritis   . Gastroparesis    secondary to poorly controlled DM, last emptying study performed 01/2010  was normal but may be falsely positive as pt was on reglan  . GERD (gastroesophageal reflux disease)   . Hepatitis B, chronic (HCC)    Hep BeAb+,Hep B cAb+ & Hep BsAg+ (9/06)  . History of pyelonephritis     H/o GrpB Pyelonephritis (9/06) and UTI- 07/11- E.Coli, 12/10- GBS  . Hyperlipidemia   . Hypertension   . Iron deficiency anemia   . Irregular menses    Small ovarian follicles seen on 99991111)  . MI (myocardial infarction) ([place]) 05/2006   PDA percutaneous transluminal coronary angioplasty  . Migraine    "weekly" (04/22/2014)  . N&V (nausea and vomiting)    Chronic. Unclear etiology with multiple admission and ED visits. CT abdomen with and without contrast (02/2011)  showed no acute process. Gastic Emptying scan (01/2010) was normal. Ultrasound of the abdomen was within normal limits. Hepatitis B viral load was undectable. HIV NR. EGD - gastritis, Hpylori + s/p Rx  . Obesity   . OSA (obstructive sleep apnea)    "suppose to wear mask but I don't" (04/22/2014)  . Peripheral neuropathy ([place])   . Pneumonia    "this is probably the 2nd or 3rd time I've had pneumonia" (04/22/2014)  . Recurrent boils   . Seasonal asthma   . Substance abuse   . Thrombocytosis ([place])    Hem/Onc suggested 2/2 chronic hepatits and/or iron deficiency anemia    Patient Active Problem List   Diagnosis Date Noted  . Non-intractable cyclical vomiting with nausea   . Dehydration 03/27/2015  . Intractable nausea and vomiting 11/11/2014  . Gastroparesis 11/11/2014  . Cough   . Hematemesis 10/13/2014  . DKA (diabetic ketoacidoses) ([place]) 10/13/2014  . Increased  anion gap metabolic acidosis 0000000  . Tachycardia 07/21/2014  . Abdominal pain, chronic, left lower quadrant   . Nausea with vomiting   . Diabetic gastroparesis associated with type 2 diabetes mellitus ([place]) 04/28/2014  . History of Helicobacter pylori infection 04/22/2014  . Vaginal discharge 02/18/2014  . Atypical chest pain 07/22/2013  . Unspecified constipation 07/21/2013  . Tinea corporis 07/21/2013  . Intractable vomiting 04/29/2013  . Dysmenorrhea 04/22/2013  . Headache(784.0) 02/08/2012  . Health care maintenance 01/22/2012  . Chronic  hepatitis B ([place]) 03/07/2011  . History of leukocytosis 04/06/2010  . THROMBOCYTOSIS 04/06/2010  . Polysubstance abuse 02/23/2010  . Iron deficiency anemia 11/22/2009  . PERIPHERAL NEUROPATHY 10/01/2009  . Hyperlipidemia 08/30/2009  . POLYNEUROPATHY, DIABETIC 08/30/2009  . Hidradenitis (recurrent boils) 07/07/2008  . Depression 12/27/2007  . Abdominal pain, left lower quadrant 11/21/2007  . FIBROMYALGIA 10/30/2007  . BACK PAIN 04/01/2007  . OBSTRUCTIVE SLEEP APNEA 01/17/2007  . ANXIETY DEPRESSION 06/27/2006  . Chronic ischemic heart disease 06/15/2006  . OBESITY, MORBID 05/15/2006  . MIGRAINE HEADACHE 05/15/2006  . Asthma 05/15/2006  . Essential hypertension 01/16/2006  . IRREGULAR MENSTRUATION 01/16/2006  . PEDAL EDEMA 01/16/2006  . Poorly controlled type 2 diabetes mellitus with peripheral neuropathy ([place]) 01/16/1989    Past Surgical History:  Procedure Laterality Date  . CESAREAN SECTION  1997  . CORONARY ANGIOPLASTY WITH STENT PLACEMENT  2008   "2 stents"  . ESOPHAGOGASTRODUODENOSCOPY N/A 04/23/2014   Procedure: ESOPHAGOGASTRODUODENOSCOPY (EGD);  Surgeon: Winfield Cunas., MD;  Location: [facility] ENDOSCOPY;  Service: Endoscopy;  Laterality: N/A;    OB History    No data available       Home Medications    Prior to Admission medications   Medication Sig Start Date End Date Taking? Authorizing Provider  albuterol (PROVENTIL HFA;VENTOLIN HFA) 108 (90 BASE) MCG/ACT inhaler Inhale 2 puffs into the lungs every 4 (four) hours as needed for wheezing or shortness of breath. 04/26/14   Florinda Marker, MD  aspirin EC 81 MG EC tablet Take 1 tablet (81 mg total) by mouth daily. 11/29/15   Holley Raring, MD  atorvastatin (LIPITOR) 80 MG tablet Take 1 tablet (80 mg total) by mouth daily at 6 PM. 04/02/15   Maryellen Pile, MD  ferrous sulfate 325 (65 FE) MG tablet Take 1 tablet (325 mg total) by mouth daily with breakfast. 04/02/15   Maryellen Pile, MD  HYDROcodone-acetaminophen  (NORCO/VICODIN) 5-325 MG tablet Take 1 tablet by mouth every 6 (six) hours as needed for moderate pain. 12/05/15   Dalia Heading, PA-C  Insulin Glargine (LANTUS SOLOSTAR) 100 UNIT/ML Solostar Pen Inject 20 Units into the skin daily at 10 pm. 04/02/15   Maryellen Pile, MD  Insulin Pen Needle 31G X 5 MM MISC Inject 20 units of Lantus at night before bed 04/02/15   Maryellen Pile, MD  losartan (COZAAR) 25 MG tablet Take 1 tablet (25 mg total) by mouth daily. 11/29/15   Holley Raring, MD  metoCLOPramide (REGLAN) 10 MG tablet Take 1 tablet (10 mg total) by mouth 3 (three) times daily as needed for nausea (headache / nausea). 01/09/16   Nona Dell, PA-C  omeprazole (PRILOSEC) 40 MG capsule Take 1 capsule (40 mg total) by mouth daily. 04/02/15   Maryellen Pile, MD  promethazine (PHENERGAN) 25 MG tablet Take 1 tablet (25 mg total) by mouth every 6 (six) hours as needed for nausea or vomiting. 12/07/15   Shary Decamp, PA-C    Family History Family History  Problem Relation Age of Onset  . Diabetes Father     Social History Social History  Substance Use Topics  . Smoking status: Former Smoker    Types: Cigarettes    Quit date: 04/24/1996  . Smokeless tobacco: Never Used     Comment: quit smoking cigarettes age 72  . Alcohol use 0.0 oz/week     Comment: 04/22/2014 "might have a few drinks a month"     Allergies   Eggs or egg-derived products; Lisinopril; and Morphine and related   Review of Systems Review of Systems  Respiratory: Negative for shortness of breath.   Gastrointestinal: Positive for abdominal pain and vomiting. Negative for diarrhea.  All other systems reviewed and are negative.    Physical Exam Updated Vital Signs BP 184/97   Pulse 113   Temp 98.6 F (37 C) (Oral)   Resp 18   LMP 01/09/2016 (Approximate)   SpO2 100%   Physical Exam  Constitutional: She is oriented to person, place, and time. She appears well-developed and well-nourished. No distress.    HENT:  Head: Normocephalic and atraumatic.  Right Ear: Hearing normal.  Left Ear: Hearing normal.  Nose: Nose normal.  Mouth/Throat: Oropharynx is clear and moist and mucous membranes are normal.  Eyes: Conjunctivae and EOM are normal. Pupils are equal, round, and reactive to light.  Neck: Normal range of motion. Neck supple.  Cardiovascular: S1 normal and S2 normal.  Exam reveals no gallop and no friction rub.   No murmur heard. Tachycardia  Pulmonary/Chest: Effort normal and breath sounds normal. No respiratory distress. She exhibits no tenderness.  Abdominal: Soft. Normal appearance and bowel sounds are normal. There is no hepatosplenomegaly. There is tenderness. There is no rebound, no guarding, no tenderness at McBurney's point and negative Murphy's sign. No hernia.  Diffuse abdominal tenderness  Musculoskeletal: Normal range of motion.  Neurological: She is alert and oriented to person, place, and time. She has normal strength. No cranial nerve deficit or sensory deficit. Coordination normal. GCS eye subscore is 4. GCS verbal subscore is 5. GCS motor subscore is 6.  Skin: Skin is warm, dry and intact. No rash noted. No cyanosis.  Psychiatric: She has a normal mood and affect. Her speech is normal and behavior is normal. Thought content normal.  Nursing note and vitals reviewed.    ED Treatments / Results   DIAGNOSTIC STUDIES: Oxygen Saturation is 94% on RA, adequate by my interpretation.    COORDINATION OF CARE: 1:05 AM Discussed treatment plan with pt at bedside and pt agreed to plan.   Labs (all labs ordered are listed, but only abnormal results are displayed) Labs Reviewed  BASIC METABOLIC PANEL - Abnormal; Notable for the following:       Result Value   Glucose, Bld 186 (*)    Creatinine, Ser 1.09 (*)    All other components within normal limits  CBC - Abnormal; Notable for the following:    WBC 16.1 (*)    Hemoglobin 9.2 (*)    HCT 30.0 (*)    MCV 62.8 (*)     MCH 19.2 (*)    RDW 19.5 (*)    Platelets 460 (*)    All other components within normal limits  I-STAT TROPOININ, ED    EKG  EKG Interpretation  Date/Time:  Wednesday January 19 2016 22:58:39 EDT Ventricular Rate:  128 PR Interval:  120 QRS Duration: 80 QT Interval:  314 QTC Calculation: 458 R Axis:   77 Text Interpretation:  Sinus tachycardia Otherwise normal ECG Confirmed by Dierdre Mccalip  MD, Kaaren Nass [phone]) on 01/20/2016 12:56:51 AM       Radiology Dg Chest 2 View  Result Date: 01/20/2016 CLINICAL DATA:  Chest pain.  Nausea and vomiting EXAM: CHEST  2 VIEW COMPARISON:  None. FINDINGS: The heart size and mediastinal contours are within normal limits. Both lungs are clear. The visualized skeletal structures are unremarkable. IMPRESSION: No active cardiopulmonary disease. Electronically Signed   By: Kerby Moors M.D.   On: 01/20/2016 00:00    Procedures Procedures (including critical care time)  Medications Ordered in ED Medications  sodium chloride 0.9 % bolus 1,000 mL (0 mLs Intravenous Stopped 01/20/16 0323)  haloperidol lactate (HALDOL) injection 2 mg (2 mg Intravenous Given 01/20/16 0156)  metoCLOPramide (REGLAN) injection 10 mg (10 mg Intravenous Given 01/20/16 0412)  diphenhydrAMINE (BENADRYL) injection 25 mg (25 mg Intravenous Given 01/20/16 0411)  ondansetron (ZOFRAN) injection 4 mg (4 mg Intravenous Given 01/20/16 0412)     Initial Impression / Assessment and Plan / ED Course  I have reviewed the triage vital signs and the nursing notes.  Pertinent labs & imaging results that were available during my care of the patient were reviewed by me and considered in my medical decision making (see chart for details).  Clinical Course   Patient with previous history of gastroparesis and chronic vomiting syndrome presents to the ER with nausea and vomiting. Patient was in moderate distress at arrival. Patient has been given IV fluids, Zofran, Reglan, Benadryl, Haldol. She  has had some improvement, however, with any movement she still has immediate vomiting. Patient will require hospitalization for continued treatment of her acute exacerbation of chronic vomiting.  Final Clinical Impressions(s) / ED Diagnoses   Final diagnoses:  Gastroparesis    New Prescriptions New Prescriptions   No medications on file   I personally performed the services described in this documentation, which was scribed in my presence. The recorded information has been reviewed and is accurate.      Orpah Greek, MD 01/20/16 [phone]

## 2016-01-20 NOTE — ED Notes (Addendum)
Patient intermittently shakes her legs due to the pain

## 2016-01-20 NOTE — Care Management Note (Addendum)
Case Management Note  Patient Details  Name: ASSUNTA KOZAKIEWICZ MRN: [id number] Date of Birth: 1975-07-13  Subjective/Objective:                    Action/Plan: Went back in patient's room to discuss below . Patient  Refusing to talk at present.   Attempted to talk to patient , patient states she is not feeling well and can not talk . Did say she had an appointment with Clarice Pole for orange card but was unable to go that day because she was vomiting.   Clarice Pole returned phone call and said patient was not eligible for orange card because she had Medicaid Family Planning which does not cover prescriptions but is still considered insurance. Ms Berdine Addison explained to patient she would have to go to social services and terminate her Medicaid Family Planning to apply for orange card. The last Ms Berdine Addison has seen patient was August 2016 . Patient has missed appointment for orange card ( medication assistance ) called and left Clarice Pole with [facility] Orange card assistance a voicemail awaiting call back.  Expected Discharge Date:                  Expected Discharge Plan:  Home/Self Care  In-House Referral:     Discharge planning Services     Post Acute Care Choice:    Choice offered to:  Patient  DME Arranged:    DME Agency:     HH Arranged:    [place] Agency:     Status of Service:     If discussed at [employer] of Stay Meetings, dates discussed:    Additional Comments:  Marilu Favre, RN 01/20/2016, 9:51 AM

## 2016-01-20 NOTE — ED Notes (Signed)
Patient up to the [facility] - back to bed and heaving started

## 2016-01-20 NOTE — ED Notes (Signed)
Backv to bed heaving  MD aware

## 2016-01-20 NOTE — H&P (Signed)
Date: 01/20/2016               Patient Name:  Shirley [name] MRN: [id number]  DOB: 1975/05/20 Age / Sex: 40 y.o., female   PCP: Shirley Salina, MD         Medical Service: Internal Medicine Teaching Service         Attending Physician: Dr. Carlyle Basques, MD    First Contact: Dr. Lovena [name] Pager: [phone]  Second Contact: Dr. Hulen [name] Pager: [phone]       After Hours (After 5p/  First Contact Pager: [phone]  weekends / holidays): Second Contact Pager: [phone]   Chief Complaint: abdominal pain and vomiting  History of Present Illness: Shirley [name] is a 40yo female with PMH of uncontrolled T2DM, gastroparesis, chronic hep B infection, CAD s/p angioplasty, and CVA without residual deficit who presents with a 2 day history of lower abdominal pain, nausea, and vomiting. She has episodes like this often and was last admitted on Nov 28, 2015. She was prescribed reglan and phenergan but has not taken them in at least a month due to not being able to afford them. She has also not taken her insulin "in a minute." At discharge, she was provided information for obtaining the orange card and medication assistance - she has missed 3 [facility] clinic f/u appointments and has not f/u on the orange card due to being sick. She was referred for a gastric emptying study in 2016, but refused at the time.   She states she's had too many episodes of emesis to count; vomitus has been clear and white and turned darker today stating there was blood in it. She denies diarrhea, constipation, dysuria, urgency, hematuria, shortness of breath, or sick contacts.  In the ED she received 1L NS bolus, zofran, reglan, benadryl, and haldol. This relieved her symptoms somewhat but they returned shortly.   Labs: Na 139, K 3.7, Cl 106, CO2 22, BUN 18, Cr 1.09 Glu 186. WBC 16.1, Hgb 9.2, Hct 30.0, Plt 460.  Meds:  No outpatient prescriptions have been marked as taking for the 01/20/16 encounter [facility] Encounter).      Allergies: Allergies as of 01/19/2016 - Review Complete 01/19/2016  Allergen Reaction Noted  . Eggs or egg-derived products Hives 03/27/2015  . Lisinopril Nausea Only and Swelling 03/17/2008  . Morphine and related Itching and Swelling 04/15/2013   Past Medical History:  Diagnosis Date  . Abscess of tunica vaginalis    10/09- Abundant S. aureus- sensitive to all abx  . Anxiety   . Blood dyscrasia   . CAD (coronary artery disease) 06/15/2006   s/p Subendocardial MI with PDA angioplasty(no stent) on 06/15/06 and relook  cath 06/19/06 showed patency of site. Cath 12/10- no restenosis or significant CAD progression  . CVA (cerebral vascular accident) ([place]) ~ 02/2014   denies residual on 04/22/2014  . CVA (cerebral vascular accident) [facility])    history of remote right cerebellar infarct noted on head CT at least since 10/2011  . Depression   . Diabetes mellitus type 2, uncontrolled, with complications ([place])   . Fibromyalgia   . Gastritis   . Gastroparesis    secondary to poorly controlled DM, last emptying study performed 01/2010  was normal but may be falsely positive as pt was on reglan  . GERD (gastroesophageal reflux disease)   . Hepatitis B, chronic (HCC)    Hep BeAb+,Hep B cAb+ & Hep BsAg+ (9/06)  . History of pyelonephritis  H/o GrpB Pyelonephritis (9/06) and UTI- 07/11- E.Coli, 12/10- GBS  . Hyperlipidemia   . Hypertension   . Iron deficiency anemia   . Irregular menses    Small ovarian follicles seen on [id number])  . MI (myocardial infarction) ([place]) 05/2006   PDA percutaneous transluminal coronary angioplasty  . Migraine    "weekly" (04/22/2014)  . N&V (nausea and vomiting)    Chronic. Unclear etiology with multiple admission and ED visits. CT abdomen with and without contrast (02/2011)  showed no acute process. Gastic Emptying scan (01/2010) was normal. Ultrasound of the abdomen was within normal limits. Hepatitis B viral load was undectable. HIV NR. EGD - gastritis,  Hpylori + s/p Rx  . Obesity   . OSA (obstructive sleep apnea)    "suppose to wear mask but I don't" (04/22/2014)  . Peripheral neuropathy ([place])   . Pneumonia    "this is probably the 2nd or 3rd time I've had pneumonia" (04/22/2014)  . Recurrent boils   . Seasonal asthma   . Substance abuse   . Thrombocytosis (HCC)    Hem/Onc suggested 2/2 chronic hepatits and/or iron deficiency anemia    Family History: Father with Diabetes  Social History: Patient lives with her boyfriend and her mom in [place]. She takes the bus to get around town and to appointments. She denies tobacco use, alcohol use, and admits to remote illicit drug use but none recently  Review of Systems: A complete ROS was negative except as per HPI.   Physical Exam: Blood pressure 184/97, pulse 113, temperature 98.6 F (37 C), temperature source Oral, resp. rate 18, last menstrual period 01/09/2016, SpO2 100 %.  Constitutional: in mild distress, lying in bed, shaking her legs voluntarily CV: tachycardic, no murmurs, rubs or gallops Resp: CTAB, no crackles or wheezing appreciated, no increased work of breathing Abd: obese, soft, decreased BS, tender to palpation in lower quadrants Ext: warm, 2+ pulses throughout, no edema  EKG: Sinus tachycardia  CXR: unremarkable  EGD 04/23/2014: retained food in stomach consistent with gastroparesis, otherwise normal exam  Assessment & Plan by Problem: Active Problems:   Intractable vomiting  Nausea and vomiting: Consistent with her history of gastroparesis that is uncontrolled; patient has been out of medications for at least a month. She has multiple returns to the ED for similar episodes. Her BMP is unremarkable but she does have a WBC of 16.1.  --received 1L NS bolus; repeat bolus and place on 16ml/hr NS x10hrs --Reglan 10mg  IV q8hrs; phenergan PO/IV/Suppository q6hrs PRN; acetaminophen 650mg  for abdominal pain --advance diet as tolerated  T2DM: Patient with  uncontrolled T2DM noncompliant with Lantus 20U qhs due to not being able to afford medicine. Last A1c on 11/28/2014 was 8.2.  --SSI --restart lantus if tolerating food  Chronic iron-deficiency anemia: Patient with Hgb of 9.2 at admission (b/l 8-10) supposed to be on iron therapy at home.  --stable --ferritin --resume FeSO4 325mg  daily  CAD: Patient with history of MI s/p angioplasty supposed to be managed with lipitor 80mg  dialy, losartan 25mg  daily, ASA 81mg  daily. --resume lipitor and losartan  Gastritis: History of gastritis on omeprazole 40mg  daily. --continue omeprazole 40mg  daily.   Dispo: Admit patient to Observation with expected length of stay less than 2 midnights.; consult Care Management for medication assistance.  Signed: Alphonzo Grieve, MD 01/20/2016, 5:41 AM  Pager: [phone]

## 2016-01-20 NOTE — ED Notes (Signed)
Patient laying in the bed shaking her legs stating it helps with the pain.  States has been having lower abd pain "for a while"  Instructed patient she had to lay still if she wanted me to start an IV because I only wanted to stick her 1 time.   Dr Betsey Holiday at the bedside.  Patient previously vomited approximately 252ml

## 2016-01-20 NOTE — Progress Notes (Signed)
   Subjective: Patient did not participate in discussion this morning. She did state that she continued to have nausea and abdominal pain. She had no questions this morning.  Objective:  Vital signs in last 24 hours: Vitals:   01/20/16 0345 01/20/16 0617 01/20/16 0641 01/20/16 1310  BP: 184/97 178/91 (!) 193/89 (!) 158/91  Pulse: 113 109 (!) 113 (!) 103  Resp: 18 18 18 18   Temp:   98.4 F (36.9 C) 98.7 F (37.1 C)  TempSrc:   Oral Oral  SpO2: 100% 100% 100% 100%  Weight:   196 lb 3.4 oz (89 kg)   Height:   5\' 9"  (1.753 m)    Physical Exam  Constitutional: She appears well-developed and well-nourished.  Laying in bed and appeared in pain and uncomfortable  HENT:  Head: Normocephalic and atraumatic.  Cardiovascular: Normal rate and regular rhythm.  Exam reveals no gallop and no friction rub.   No murmur heard. Respiratory: Effort normal and breath sounds normal. No respiratory distress. She has no wheezes.  GI: Soft. There is tenderness.  Tenderness to palpation throughout the abdomen. No bowel sounds were appreciated. Patient does endorse passing gas.  Musculoskeletal: She exhibits no edema.  Neurological: She is alert.  Patient was drowsy during the examination     Assessment/Plan: Mrs. Kazar also 40 year old female with a past medical history of insulin-dependent diabetes mellitus, complicated by gastroparesis, depression, hypertension and chronic abdominal pain associated with nausea and vomiting who presents with intractable nausea and vomiting.  1.Nausea and vomiting  Consistent with her history of gastroparesis that is uncontrolled; patient has been out of medications for at least a month. She has multiple returns to the ED for similar episodes.  --received 1L NS bolus; repeat bolus and place on 120ml/hr NS x10hrs --Reglan 10mg  IV q8hrs; phenergan PO/IV/Suppository q6hrs PRN; acetaminophen 650mg  for abdominal pain --advance diet as tolerated  2. T2DM  Patient with  uncontrolled T2DM noncompliant with Lantus 20U qhs due to not being able to afford medicine. Last A1c on 11/28/2014 was 8.2.  --SSI --restart lantus if tolerating food  3. Chronic iron-deficiency anemia  Patient with Hgb of 9.2 at admission (b/l 8-10) supposed to be on iron therapy at home.  --ferritin 7 --resume FeSO4 325mg  daily  4. CAD  Patient with history of MI s/p angioplasty supposed to be managed with lipitor 80mg  dialy, losartan 25mg  daily, ASA 81mg  daily. --resume lipitor and losartan  5. Gastritis  History of gastritis on omeprazole 40mg  daily. --continue omeprazole 40mg  daily.   6. DVT/PE prophylaxis  -- Lovenox 40 mg subcutaneous injection once daily  Dispo: Anticipated discharge in approximately 1 day(s).   Ophelia Shoulder, MD 01/20/2016, 2:23 PM Pager: [phone]

## 2016-01-20 NOTE — ED Notes (Signed)
Patient resting quietly at this time - no more shaking

## 2016-01-20 NOTE — Progress Notes (Signed)
Pt refuses to take meds, will not talk with staff, stays curled up in fetal position in bed and will not allow me to open shade. Will continue to monitor

## 2016-01-21 ENCOUNTER — Encounter: Payer: Self-pay | Admitting: Internal Medicine

## 2016-01-21 DIAGNOSIS — E1143 Type 2 diabetes mellitus with diabetic autonomic (poly)neuropathy: Principal | ICD-10-CM

## 2016-01-21 DIAGNOSIS — I252 Old myocardial infarction: Secondary | ICD-10-CM

## 2016-01-21 DIAGNOSIS — Z955 Presence of coronary angioplasty implant and graft: Secondary | ICD-10-CM

## 2016-01-21 DIAGNOSIS — D509 Iron deficiency anemia, unspecified: Secondary | ICD-10-CM

## 2016-01-21 DIAGNOSIS — Z794 Long term (current) use of insulin: Secondary | ICD-10-CM

## 2016-01-21 DIAGNOSIS — K2950 Unspecified chronic gastritis without bleeding: Secondary | ICD-10-CM

## 2016-01-21 DIAGNOSIS — I2510 Atherosclerotic heart disease of native coronary artery without angina pectoris: Secondary | ICD-10-CM

## 2016-01-21 DIAGNOSIS — K3184 Gastroparesis: Secondary | ICD-10-CM

## 2016-01-21 LAB — CBC
HCT: 28.3 % — ABNORMAL LOW (ref 36.0–46.0)
Hemoglobin: 8.5 g/dL — ABNORMAL LOW (ref 12.0–15.0)
MCH: 18.7 pg — ABNORMAL LOW (ref 26.0–34.0)
MCHC: 30 g/dL (ref 30.0–36.0)
MCV: 62.3 fL — ABNORMAL LOW (ref 78.0–100.0)
Platelets: 417 K/uL — ABNORMAL HIGH (ref 150–400)
RBC: 4.54 MIL/uL (ref 3.87–5.11)
RDW: 19.4 % — ABNORMAL HIGH (ref 11.5–15.5)
WBC: 11.2 K/uL — ABNORMAL HIGH (ref 4.0–10.5)

## 2016-01-21 LAB — BASIC METABOLIC PANEL WITH GFR
Anion gap: 5 (ref 5–15)
BUN: 13 mg/dL (ref 6–20)
CO2: 24 mmol/L (ref 22–32)
Calcium: 8.7 mg/dL — ABNORMAL LOW (ref 8.9–10.3)
Chloride: 106 mmol/L (ref 101–111)
Creatinine, Ser: 0.96 mg/dL (ref 0.44–1.00)
GFR calc Af Amer: >60 mL/min
GFR calc non Af Amer: >60 mL/min
Glucose, Bld: 214 mg/dL — ABNORMAL HIGH (ref 65–99)
Potassium: 3.7 mmol/L (ref 3.5–5.1)
Sodium: 135 mmol/L (ref 135–145)

## 2016-01-21 LAB — GLUCOSE, CAPILLARY
GLUCOSE-CAPILLARY: 173 mg/dL — AB (ref 65–99)
GLUCOSE-CAPILLARY: 283 mg/dL — AB (ref 65–99)
Glucose-Capillary: 254 mg/dL — ABNORMAL HIGH (ref 65–99)
Glucose-Capillary: 175 mg/dL — ABNORMAL HIGH (ref 65–99)

## 2016-01-21 LAB — HEPATITIS B SURFACE ANTIBODY, QUANTITATIVE

## 2016-01-21 MED ORDER — HYDROMORPHONE HCL 1 MG/ML IJ SOLN
0.5000 mg | Freq: Once | INTRAMUSCULAR | Status: AC
  Administered 2016-01-21: 0.5 mg via INTRAVENOUS

## 2016-01-21 MED ORDER — HYDROMORPHONE HCL 1 MG/ML IJ SOLN: 1.0000 mg | Freq: Once | INTRAMUSCULAR | Status: AC

## 2016-01-21 MED ORDER — HYDROMORPHONE HCL 1 MG/ML IJ SOLN
INTRAMUSCULAR | Status: AC
  Filled 2016-01-21: qty 1

## 2016-01-21 MED ORDER — HYDROMORPHONE HCL 1 MG/ML IJ SOLN
INTRAMUSCULAR | Status: AC
  Administered 2016-01-21: 12:00:00
  Filled 2016-01-21: qty 1

## 2016-01-21 MED ORDER — WHITE PETROLATUM GEL
Status: AC
  Administered 2016-01-21: 12:00:00
  Filled 2016-01-21: qty 1

## 2016-01-21 MED ORDER — SODIUM CHLORIDE 0.9 % IV SOLN
INTRAVENOUS | Status: DC
  Administered 2016-01-21 – 2016-01-22 (×3): via INTRAVENOUS

## 2016-01-21 NOTE — Progress Notes (Signed)
   Subjective: Patient continues to complain of nausea and abdominal pain. She was only able to eat a Lin[place] food yesterday and this made her sick.  Objective:  Vital signs in last 24 hours: Vitals:   01/20/16 0641 01/20/16 1310 01/20/16 2108 01/21/16 0507  BP: (!) 193/89 (!) 158/91 (!) 155/95 131/86  Pulse: (!) 113 (!) 103 (!) 111 98  Resp: 18 18 18 18   Temp: 98.4 F (36.9 C) 98.7 F (37.1 C) 99 F (37.2 C) 98.5 F (36.9 C)  TempSrc: Oral Oral Oral Oral  SpO2: 100% 100% 100% 100%  Weight: 196 lb 3.4 oz (89 kg)     Height: 5\' 9"  (1.753 m)      Physical Exam  Constitutional: She is oriented to person, place, and time. She appears well-developed and well-nourished.  HENT:  Head: Normocephalic and atraumatic.  Extremely poor dentition  Cardiovascular: Normal rate and regular rhythm.  Exam reveals no gallop and no friction rub.   No murmur heard. Respiratory: Effort normal and breath sounds normal. No respiratory distress. She has no wheezes.  GI: Soft. There is tenderness.  Mild tenderness to palpation left lower quadrant, bowel sounds hypoactive  Musculoskeletal: She exhibits no edema.  Neurological: She is alert and oriented to person, place, and time.     Assessment/Plan: Mrs. Shirley [name] also 40 year old female with a past medical history of insulin-dependent diabetes mellitus, complicated by gastroparesis, depression, hypertension and chronic abdominal pain associated with nausea and vomiting who presents with intractable nausea and vomiting.  In summary, patient was more interactive on examination this morning. She still endorses abdominal pain and nausea. We will continue with her current gastroparesis regimen and advance her diet as tolerated.  1. Gastroparesis  Consistent with her history of gastroparesis that is uncontrolled; patient has been out of medications for at least a month. She has multiple returns to the ED for similar episodes.  -- Normal saline 100 ML per  hour --Reglan 10mg  IV q8hrs; phenergan PO/IV/Suppository q6hrs PRN; acetaminophen 650mg  for abdominal pain -- Advance diet as tolerated -- Given hydromorphone 1 mg IV  2. T2DM  Patient with uncontrolled T2DM noncompliant with Lantus 20U qhs due to not being able to afford medicine. Last A1c on 11/28/2014 was 8.2.  --SSI --restart lantus if tolerating food  3. Chronic iron-deficiency anemia  Patient with Hgb of 9.2 at admission (b/l 8-10) supposed to be on iron therapy at home.  --ferritin 7 --resume FeSO4 325mg  daily  4. CAD  Patient with history of MI s/p angioplasty supposed to be managed with lipitor 80mg  dialy, losartan 25mg  daily, ASA 81mg  daily. --resume lipitor and losartan  5. Gastritis  History of gastritis on omeprazole 40mg  daily. --continue omeprazole 40mg  daily.   6. DVT/PE prophylaxis  -- Lovenox 40 mg subcutaneous injection once daily  Dispo: Anticipated discharge in approximately 1-2 day(s).   Ophelia Shoulder, MD 01/21/2016, 8:02 AM Pager: [phone]

## 2016-01-21 NOTE — Care Management Note (Signed)
Case Management Note  Patient Details  Name: SHAMANI CACCIA MRN: [id number] Date of Birth: 07/08/1975  Subjective/Objective:                    Action/Plan:  See previous case manager note.   Spoke with patient today . Explained CM spoke with Clarice Pole ( orange card [facility]) . Ms Berdine Addison advised patient again to go to social security and terminate her Medicaid Family Planning , then call Ms Berdine Addison and she can help patient apply for orange card . Discussed this with patient she voiced understanding and stated she will do the same.   Patient not eligible for [facility] letter ( medication assistance) , because of the [facility] Family Planning.  Received permission from [place] CM to provide [facility] letter , one time only . Patient voiced understanding. If patient readmitted case management is unable to provide [facility] again. Patient voiced understanding. Expected Discharge Date:                  Expected Discharge Plan:  Home/Self Care  In-House Referral:     Discharge planning Services  Medication Assistance, [facility] Program  Post Acute Care Choice:    Choice offered to:  Patient  DME Arranged:    DME Agency:     HH Arranged:    [place] Agency:     Status of Service:  Completed, signed off  If discussed at [employer] of Stay Meetings, dates discussed:    Additional Comments:  Marilu Favre, RN 01/21/2016, 11:24 AM

## 2016-01-22 DIAGNOSIS — Z79899 Other long term (current) drug therapy: Secondary | ICD-10-CM

## 2016-01-22 DIAGNOSIS — Z9114 Patient's other noncompliance with medication regimen: Secondary | ICD-10-CM

## 2016-01-22 DIAGNOSIS — Z596 Low income: Secondary | ICD-10-CM

## 2016-01-22 LAB — GLUCOSE, CAPILLARY
GLUCOSE-CAPILLARY: 217 mg/dL — AB (ref 65–99)
Glucose-Capillary: 81 mg/dL (ref 65–99)
Glucose-Capillary: 131 mg/dL — ABNORMAL HIGH (ref 65–99)

## 2016-01-22 MED ORDER — CLOTRIMAZOLE 1 % EX CREA
TOPICAL_CREAM | Freq: Two times a day (BID) | CUTANEOUS | Status: DC
  Administered 2016-01-22 – 2016-01-23 (×3): via TOPICAL
  Administered 2016-01-23: 1 via TOPICAL
  Administered 2016-01-24 – 2016-01-25 (×2): via TOPICAL
  Filled 2016-01-22: qty 15

## 2016-01-22 MED ORDER — KETOROLAC TROMETHAMINE 30 MG/ML IJ SOLN
30.0000 mg | Freq: Four times a day (QID) | INTRAMUSCULAR | Status: AC | PRN
  Administered 2016-01-22 – 2016-01-23 (×2): 30 mg via INTRAVENOUS
  Filled 2016-01-22 (×2): qty 1

## 2016-01-22 MED ORDER — KETOROLAC TROMETHAMINE 30 MG/ML IJ SOLN
30.0000 mg | Freq: Three times a day (TID) | INTRAMUSCULAR | Status: DC | PRN
  Administered 2016-01-22: 30 mg via INTRAVENOUS
  Filled 2016-01-22: qty 1

## 2016-01-22 MED ORDER — ASPIRIN 81 MG PO CHEW
81.0000 mg | CHEWABLE_TABLET | Freq: Every day | ORAL | Status: DC
  Administered 2016-01-22 – 2016-01-25 (×4): 81 mg via ORAL
  Filled 2016-01-22 (×4): qty 1

## 2016-01-22 NOTE — Progress Notes (Signed)
MD paged (i.e.) HTN..will give update report in 1/2 after repeat BP.

## 2016-01-22 NOTE — Progress Notes (Addendum)
   Subjective: Patient reports feeling better this morning. Still having some nausea but no emesis. Abdominal pain resolving. Tolerating liquid diet. Agreeable to advancing diet today.   Objective: Vital signs in last 24 hours: Vitals:   01/21/16 1339 01/21/16 2143 01/22/16 0449 01/22/16 0600  BP: 122/69 120/71 117/64   Pulse: 94 97 87   Resp:  18 18   Temp: 98.5 F (36.9 C) 99 F (37.2 C) 98.8 F (37.1 C)   TempSrc: Oral Oral Oral   SpO2: 100% 100% 100%   Weight:    209 lb 7 oz (95 kg)  Height:       Weight change:   Intake/Output Summary (Last 24 hours) at 01/22/16 1113 Last data filed at 01/22/16 1015  Gross per 24 hour  Intake             2220 ml  Output              900 ml  Net             1320 ml    Physical Exam  Constitutional: She appears well-developed and well-nourished. No distress. Cardiovascular: RRR. No M/R/G. Respiratory: Effort normal and breath sounds normal. No respiratory distress. She has no wheezes.  GI: Soft. Mild tenderness to palpation left lower quadrant, bowel sounds present but hypoactive Musculoskeletal: She exhibits no edema.   Medications: I have reviewed the patient's current medications. Scheduled Meds: . atorvastatin  80 mg Oral q1800  . enoxaparin (LOVENOX) injection  40 mg Subcutaneous Q24H  . ferrous sulfate  325 mg Oral Q breakfast  . insulin aspart  0-9 Units Subcutaneous TID WC  . losartan  25 mg Oral Daily  . metoCLOPramide (REGLAN) injection  10 mg Intravenous Q8H  . pantoprazole  40 mg Oral Daily  . sodium chloride  1,000 mL Intravenous Once   Continuous Infusions: . sodium chloride 100 mL/hr at 01/22/16 0601   PRN Meds:.acetaminophen **OR** acetaminophen, albuterol, promethazine **OR** promethazine **OR** promethazine Assessment/Plan:  1. Gastroparesis: Non-compliant with medications outpatient. Slowly improving with resumption of her home medications. Tolerating clear liquid diet. Still has some nausea but no vomiting.  Abdominal pain improving. Will advance diet today.  - D/C IVF - tolerating diet - Reglan 10mg  IV q8hrs; phenergan PO/IV/Suppository q6hrs PRN;  - Acetaminophen 650mg  for abdominal pain - Advance diet as tolerated  2. T2DM: Patient with uncontrolled T2DM noncompliant with Lantus 20U qhs due to not being able to afford medicine. Last A1c on 11/28/2014 was 8.2.  --SSI --holding lantus until PO intake improved  3. Chronic iron-deficiency anemia: Patient with Hgb of 9.2 at admission (b/l 8-10) supposed to be on iron therapy at home.  --ferritin 7 --FeSO4 325mg  daily  4. CAD: Patient with history of MI s/p angioplasty supposed to be managed with lipitor 80mg  dialy, losartan 25mg  daily, ASA 81mg  daily. --resume lipitor and losartan --resume ASA 81 mg daily  5. Gastritis:History of gastritis on omeprazole 40mg  daily. --continue omeprazole 40mg  daily.   6. DVT/PE prophylaxis  -- Lovenox 40 mg subcutaneous injection once daily  Dispo: Disposition is deferred at this time, awaiting improvement of current medical problems.  Anticipated discharge in approximately 1 day(s).   The patient does have a current PCP Collier Salina, MD) and does need an [facility] hospital follow-up appointment after discharge.   LOS: 2 days   Maryellen Pile, MD IMTS PGY-2 [phone] 01/22/2016, 11:13 AM

## 2016-01-23 LAB — CBC WITH DIFFERENTIAL/PLATELET
BASOS ABS: 0 10*3/uL (ref 0.0–0.1)
BASOS PCT: 0 %
EOS ABS: 0.2 10*3/uL (ref 0.0–0.7)
Eosinophils Relative: 1 %
HCT: 30.3 % — ABNORMAL LOW (ref 36.0–46.0)
HEMOGLOBIN: 9.2 g/dL — AB (ref 12.0–15.0)
LYMPHS PCT: 17 %
Lymphs Abs: 2.6 10*3/uL (ref 0.7–4.0)
MCH: 19.2 pg — AB (ref 26.0–34.0)
MCHC: 30.4 g/dL (ref 30.0–36.0)
MCV: 63.1 fL — ABNORMAL LOW (ref 78.0–100.0)
MONO ABS: 1.1 10*3/uL — AB (ref 0.1–1.0)
Monocytes Relative: 7 %
NEUTROS ABS: 11.5 10*3/uL — AB (ref 1.7–7.7)
NEUTROS PCT: 75 %
PLATELETS: 424 10*3/uL — AB (ref 150–400)
RBC: 4.8 MIL/uL (ref 3.87–5.11)
RDW: 19.8 % — ABNORMAL HIGH (ref 11.5–15.5)
WBC: 15.4 10*3/uL — ABNORMAL HIGH (ref 4.0–10.5)

## 2016-01-23 LAB — GLUCOSE, CAPILLARY
GLUCOSE-CAPILLARY: 100 mg/dL — AB (ref 65–99)
GLUCOSE-CAPILLARY: 112 mg/dL — AB (ref 65–99)
GLUCOSE-CAPILLARY: 110 mg/dL — AB (ref 65–99)

## 2016-01-23 LAB — BASIC METABOLIC PANEL
ANION GAP: 8 (ref 5–15)
BUN: 8 mg/dL (ref 6–20)
CALCIUM: 9.3 mg/dL (ref 8.9–10.3)
CO2: 28 mmol/L (ref 22–32)
Chloride: 103 mmol/L (ref 101–111)
Creatinine, Ser: 0.94 mg/dL (ref 0.44–1.00)
GFR calc Af Amer: >60 mL/min (ref >60)
Glucose, Bld: 94 mg/dL (ref 65–99)
POTASSIUM: 3.4 mmol/L — AB (ref 3.5–5.1)
SODIUM: 139 mmol/L (ref 135–145)

## 2016-01-23 MED ORDER — POTASSIUM CHLORIDE CRYS ER 20 MEQ PO TBCR: 20.0000 meq | EXTENDED_RELEASE_TABLET | Freq: Two times a day (BID) | ORAL | Status: DC

## 2016-01-23 MED ORDER — FLUCONAZOLE 100 MG PO TABS
150.0000 mg | ORAL_TABLET | Freq: Once | ORAL | Status: AC
  Administered 2016-01-23: 150 mg via ORAL
  Filled 2016-01-23: qty 2

## 2016-01-23 MED ORDER — KETOROLAC TROMETHAMINE 30 MG/ML IJ SOLN
30.0000 mg | Freq: Four times a day (QID) | INTRAMUSCULAR | Status: AC | PRN
  Administered 2016-01-23 – 2016-01-24 (×3): 30 mg via INTRAVENOUS
  Filled 2016-01-23 (×3): qty 1

## 2016-01-23 MED ORDER — SODIUM CHLORIDE 0.9 % IV SOLN
INTRAVENOUS | Status: DC
  Administered 2016-01-23 – 2016-01-25 (×3): via INTRAVENOUS

## 2016-01-23 MED ORDER — POTASSIUM CHLORIDE CRYS ER 20 MEQ PO TBCR
30.0000 meq | EXTENDED_RELEASE_TABLET | Freq: Once | ORAL | Status: AC
  Administered 2016-01-23: 30 meq via ORAL
  Filled 2016-01-23: qty 1

## 2016-01-23 NOTE — Progress Notes (Signed)
   Subjective: No acute events overnight. Patient continues to endorse severe left lower quadrant abdominal pain. Still complains of nausea and vomiting. She has no additional questions or concerns this morning.  Objective:  Vital signs in last 24 hours: Vitals:   01/22/16 2235 01/22/16 2245 01/22/16 2331 01/23/16 0556  BP: (!) 190/110 (!) 210/120 124/73 138/89  Pulse: (!) 128 (!) 135 (!) 103 (!) 107  Resp:    17  Temp:  99.1 F (37.3 C) 98.8 F (37.1 C) 98.4 F (36.9 C)  TempSrc:  Oral Oral Oral  SpO2:  100% 100% 100%  Weight:      Height:       Physical Exam  Constitutional: She is oriented to person, place, and time. She appears well-developed and well-nourished.  HENT:  Head: Normocephalic and atraumatic.  Extremely poor oral dentition  Cardiovascular: Regular rhythm.  Exam reveals no gallop and no friction rub.   No murmur heard. Tachycardic on examination  Respiratory: Effort normal and breath sounds normal. No respiratory distress. She has no wheezes.  GI: Soft. She exhibits no distension and no mass. There is tenderness.  Bowel sounds hypoactive, tenderness to palpation left lower quadrant  Musculoskeletal: She exhibits no edema.  Neurological: She is alert and oriented to person, place, and time.     Assessment/Plan: Mrs. Axtman is a 40 year old female with a past medical history of insulin-dependent diabetes mellitus, complicatedby gastroparesis, depression, hypertension and chronic abdominal pain associated with nausea and vomiting who presents with intractable nausea and vomiting.  In summary, patient continues to endorse nausea, vomiting and left lower quadrant abdominal pain. Most likely etiology for the patient's symptoms are gastroparesis secondary to uncontrolled diabetes.  1. Gastroparesis Consistent with her history of gastroparesis that is uncontrolled; patient has been out of medications for at least a month. She has multiple returns to the ED for  similar episodes.  --Reglan 10mg  IV q8hrs; phenergan PO/IV/Suppository q6hrs PRN; acetaminophen 650mg  for abdominal pain -- Advance diet as tolerated -- Normal saline 100 mL per hour -- CBC with differential  2. T2DM Patient with uncontrolled T2DM noncompliant with Lantus 20U qhs due to not being able to afford medicine. Last A1c on 11/28/2014 was 8.2.  --SSI --restart lantus if tolerating food  3. Chronic iron-deficiency anemia Patient with Hgb of 9.2 at admission (b/l 8-10) supposed to be on iron therapy at home.  --ferritin 7 --resume FeSO4 325mg  daily  4. CAD Patient with history of MI s/p angioplasty supposed to be managed with lipitor 80mg  dialy, losartan 25mg  daily, ASA 81mg  daily. --resume lipitor and losartan  5. Gastritis History of gastritis on omeprazole 40mg  daily. --continue omeprazole 40mg  daily.   6. DVT/PE prophylaxis  -- Lovenox 40 mg subcutaneous injection once daily  Dispo: Anticipated discharge tomorrow.   Ophelia Shoulder, MD 01/23/2016, 7:40 AM Pager: [phone]

## 2016-01-23 NOTE — Discharge Summary (Signed)
Name: Shirley [name] MRN: [id number] DOB: 02/26/1976 40 y.o. PCP: Collier Salina, MD  Date of Admission: 01/20/2016 12:44 AM Date of Discharge: 01/25/2016 Attending Physician: Carlyle Basques, MD  Discharge Diagnosis: 1. Intractable nausea and vomiting secondary to gastroparesis 2. 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 Chronic iron deficiency anemia  Discharge Medications:   Medication List    STOP taking these medications   HYDROcodone-acetaminophen 5-325 MG tablet Commonly known as:  NORCO/VICODIN   oxyCODONE-acetaminophen 5-325 MG tablet Commonly known as:  PERCOCET/ROXICET     TAKE these medications   albuterol 108 (90 Base) MCG/ACT inhaler Commonly known as:  PROVENTIL HFA;VENTOLIN HFA Inhale 2 puffs into the lungs every 4 (four) hours as needed for wheezing or shortness of breath.   aspirin 81 MG EC tablet Take 1 tablet (81 mg total) by mouth daily.   atorvastatin 80 MG tablet Commonly known as:  LIPITOR Take 1 tablet (80 mg total) by mouth daily at 6 PM.   ferrous sulfate 325 (65 FE) MG tablet Take 1 tablet (325 mg total) by mouth daily with breakfast.   Insulin Glargine 100 UNIT/ML Solostar Pen Commonly known as:  LANTUS SOLOSTAR Inject 20 Units into the skin daily at 10 pm.   Insulin Pen Needle 31G X 5 MM Misc Inject 20 units of Lantus at night before bed   losartan 25 MG tablet Commonly known as:  COZAAR Take 1 tablet (25 mg total) by mouth daily.   metoCLOPramide 10 MG tablet Commonly known as:  REGLAN Take 1 tablet (10 mg total) by mouth 3 (three) times daily as needed for nausea (headache / nausea).   omeprazole 40 MG capsule Commonly known as:  PRILOSEC Take 1 capsule (40 mg total) by mouth daily.   promethazine 25 MG tablet Commonly known as:  PHENERGAN Take 1 tablet (25 mg total) by mouth every 6 (six) hours as needed for nausea or vomiting.       Disposition and follow-up:   Shirley [name]  was discharged from [facility] in Good condition.  At the hospital follow up visit please address:  1.  Please ensure the patient continues to take her prescribed medication. Please ensure the patient has follow-up for her diabetes.  2.  Labs / imaging needed at time of follow-up: None  3.  Pending labs/ test needing follow-up: None  Follow-up Appointments:  Follow-up appointment scheduled with the [facility] internal medicine clinic. Bus pass will be provided for the patient to make this appointment.  Hospital Course by problem list: Active Problems:   Intractable vomiting   1. Intractable nausea and vomiting secondary to gastroparesis Patient presented to the [facility] emergency department on 01/12/2016 complaining of intractable nausea, vomiting and abdominal pain. Shirley [name] is well known to the internal medicine service where she has had several admissions and emergency department visits for similar symptoms of abdominal pain, nausea and vomiting. Her most recent admission the patient states that she has had too many episodes of emesis to count. She describes her vomiting is nonbilious and nonbloody. At her previous hospitalization at discharge she was prescribed Reglan and Phenergan but states that she has not taken them in the last month due to not being able to afford them. In the emergency department the patient was afebrile and hemodynamically stable. She received 1 L normal saline bolus, Zofran, Reglan, Benadryl and Haldol which relieved her symptoms. Her labs were significant for a sodium of  139, K3.7, CL 106, CO2 22, BUN 18, creatinine 1.09 and a glucose of 186. She had a leukocytosis with a white blood cell count of 16.1 and an anemia with a hemoglobin of 9.2. She had a thrombocytosis with a platelet count of 460. She was admitted to the [facility] internal medicine teaching service for further management of her nausea and vomiting secondary to gastroparesis.  While  on service the patient was treated with IV fluids and given Reglan 10 mg IV every 8 hours and Phenergan every 6 hours as needed. She was made nothing by mouth and her diet was slowly advanced as tolerated. She was then switched to Zofran from promethazine. She continued to improve and had a 48 hour stent without any emesis. She will be discharged on Reglan and promethazine. She has scheduled follow-up with the [facility] internal medicine clinic.  2. Type 2 diabetes mellitus complicated by gastroparesis  The patient has a history of poorly controlled diabetes. Her most recent hemoglobin A1c on 11/28/2014 was 8.2. She was placed on sliding scale insulin. Patient has been poorly compliant with her medications secondary to financial difficulties. She'll benefit from close outpatient follow-up and adjustments to her diabetes.  3. Chronic iron deficiency anemia Patient with a hemoglobin of 9.2 at admission. Her baseline hemoglobin ranges between 8-10. She's been prescribed oral iron therapy at home for which she has not been adherent. She has no signs or symptoms concerning for active bleeding or blood loss. Her ferritin was 7. Repeat hemoglobin on 01/23/2016 was 9.2. She was started back on oral ferrous sulfate therapy 325 mg once daily. She will benefit from continuing this medication in the outpatient setting.   Discharge Vitals:   BP 118/65   Pulse 100   Temp 99 F (37.2 C)   Resp 17   Ht 5\' 9"  (1.753 m)   Wt 212 lb 0.8 oz (96.2 kg)   LMP 01/09/2016 (Approximate)   SpO2 98%   BMI 31.31 kg/m   Pertinent Labs, Studies, and Procedures:  1. Chest x-ray- no acute cardiopulmonary abnormality  Discharge Instructions: Discharge Instructions    Diet - low sodium heart healthy    Complete by:  As directed    Discharge instructions    Complete by:  As directed    Please continue to take your medications as prescribed. Please advance your diet slowly as we discussed with you this afternoon.  You  have an appointment scheduled in the [facility] internal medicine clinic on 01/31/2016 at 1045. Please ensure you make this appointment.   Increase activity slowly    Complete by:  As directed       Signed: Ophelia Shoulder, MD 01/25/2016, 1:01 PM   Pager: [phone]

## 2016-01-24 ENCOUNTER — Inpatient Hospital Stay (HOSPITAL_COMMUNITY): Payer: Medicaid Other

## 2016-01-24 LAB — GLUCOSE, CAPILLARY
GLUCOSE-CAPILLARY: 234 mg/dL — AB (ref 65–99)
GLUCOSE-CAPILLARY: 136 mg/dL — AB (ref 65–99)
GLUCOSE-CAPILLARY: 143 mg/dL — AB (ref 65–99)
Glucose-Capillary: 294 mg/dL — ABNORMAL HIGH (ref 65–99)
Glucose-Capillary: 268 mg/dL — ABNORMAL HIGH (ref 65–99)

## 2016-01-24 MED ORDER — ONDANSETRON HCL 4 MG/2ML IJ SOLN
4.0000 mg | Freq: Three times a day (TID) | INTRAMUSCULAR | Status: DC
  Administered 2016-01-24 – 2016-01-25 (×3): 4 mg via INTRAVENOUS
  Filled 2016-01-24 (×3): qty 2

## 2016-01-24 NOTE — Progress Notes (Addendum)
   Subjective: No acute events overnight. Patient denies any episodes of emesis. She continues to have nausea and abdominal pain but appears improved on examination this morning.  Objective:  Vital signs in last 24 hours: Vitals:   01/23/16 0556 01/23/16 1549 01/23/16 2317 01/24/16 0635  BP: 138/89 (!) 138/94 114/77 133/66  Pulse: (!) 107 (!) 109 73 88  Resp: 17 18 19 17   Temp: 98.4 F (36.9 C) 98.8 F (37.1 C) 98.7 F (37.1 C) 98.6 F (37 C)  TempSrc: Oral Oral Oral   SpO2: 100% 100% 96% 100%  Weight:      Height:       Physical Exam  Constitutional: She is oriented to person, place, and time. She appears well-developed and well-nourished.  HENT:  Head: Normocephalic and atraumatic.  Cardiovascular: Normal rate and regular rhythm.  Exam reveals no gallop and no friction rub.   No murmur heard. Respiratory: Effort normal and breath sounds normal. No respiratory distress. She has no wheezes.  GI: Soft. Bowel sounds are normal. She exhibits no distension. There is no tenderness.  Patient's bowel sounds are normal. This is a change and improvement from prior examination  Musculoskeletal: She exhibits no edema.  Neurological: She is alert and oriented to person, place, and time.     Assessment/Plan: Mrs. Wirz is a 40 year old female with a past medical history of insulin-dependent diabetes mellitus, complicatedby gastroparesis, depression, hypertension and chronic abdominal pain associated with nausea and vomiting who presents with intractable nausea and vomiting.  In summary, patient appears improved today. She had no episodes of emesis overnight. We'll make adjustments to her nausea medication regimen to see if we can achieve further improvements.  1.Gastroparesis Consistent with her history of gastroparesis that is uncontrolled; patient has been out of medications for at least a month. She has multiple returns to the ED for similar episodes.  --Metoclopramide 10 mg IV  scheduled every 8 hours, Zofran 4 mg IV scheduled every 8 hours -- Advance diet as tolerated -- Normal saline 100 mL per hour -- Physical therapy evaluation and treat -- Follow-up results of complete abdominal ultrasound  2. T2DM Patient with uncontrolled T2DM noncompliant with Lantus 20U qhs due to not being able to afford medicine. Last A1c on 11/28/2014 was 8.2.  --SSI --restart lantus if tolerating food  3. Chronic iron-deficiency anemia Patient with Hgb of 9.2 at admission (b/l 8-10) supposed to be on iron therapy at home.  --ferritin 7 --resume FeSO4 325mg  daily  4. CAD Patient with history of MI s/p angioplasty supposed to be managed with lipitor 80mg  dialy, losartan 25mg  daily, ASA 81mg  daily. --resume lipitor and losartan  5. Gastritis History of gastritis on omeprazole 40mg  daily. --continue omeprazole 40mg  daily.   6. DVT/PE prophylaxis  -- Lovenox 40 mg subcutaneous injection once daily  Dispo: Anticipated discharge this afternoon or tomorrow evening.   Ophelia Shoulder, MD 01/24/2016, 10:57 AM Pager: [phone]

## 2016-01-25 LAB — GLUCOSE, CAPILLARY
GLUCOSE-CAPILLARY: 207 mg/dL — AB (ref 65–99)
Glucose-Capillary: 253 mg/dL — ABNORMAL HIGH (ref 65–99)
Glucose-Capillary: 206 mg/dL — ABNORMAL HIGH (ref 65–99)

## 2016-01-25 NOTE — Progress Notes (Signed)
Inpatient Diabetes Program Recommendations  AACE/ADA: New Consensus Statement on Inpatient Glycemic Control (2015)  Target Ranges:  Prepandial:   less than 140 mg/dL      Peak postprandial:   less than 180 mg/dL (1-2 hours)      Critically ill patients:  140 - 180 mg/dL   Lab Results  Component Value Date   GLUCAP 268 (H) 01/24/2016   HGBA1C 8.2 (H) 11/28/2015    Review of Glycemic Control  Results for KIMERA, PAPAGEORGIOU (MRN [id number]) as of 01/25/2016 08:40  Ref. Range 01/24/2016 03:30 01/24/2016 08:06 01/24/2016 12:35 01/24/2016 17:34 01/24/2016 22:08  Glucose-Capillary Latest Ref Range: 65 - 99 mg/dL 234 (H) 136 (H) 143 (H) 294 (H) 268 (H)    Diabetes history: Type 2 Outpatient Diabetes medications: Lantus 20 units qhs Current orders for Inpatient glycemic control: Novolog 0-9 units tid  Inpatient Diabetes Program Recommendations:     Noted that patient will likely go home later today.   Consider restarting Lantus 20 units qhs.  Change diet to carb modified/heart healthy while inpatient.  Gentry Fitz, RN, BA, MHA, CDE Diabetes Coordinator Inpatient Diabetes Program  [phone] (Team Pager) [phone] ([place]) 01/25/2016 8:43 AM

## 2016-01-25 NOTE — Progress Notes (Signed)
Pt.'s ride has arrived. D/C papers gone over with pt. IV taken out. No questions/complaints. Pt. D/c'd successfully.

## 2016-01-25 NOTE — Progress Notes (Signed)
   Subjective: No acute events overnight. Patient has gone approximately 48 hours without throwing up. She says her nausea and vomiting are better. She was able to have a diet yesterday and keep down everything that she ate. She has also been walking around and states that she is feeling better. We'll be able to discharge her this afternoon.  Objective:  Vital signs in last 24 hours: Vitals:   01/24/16 0635 01/24/16 1327 01/24/16 2204 01/25/16 0500  BP: 133/66 127/89 131/80   Pulse: 88 92 (!) 101   Resp: 17 17 18    Temp: 98.6 F (37 C) 98.2 F (36.8 C) 98 F (36.7 C)   TempSrc:  Oral Oral   SpO2: 100% 100% 100%   Weight:    212 lb 0.8 oz (96.2 kg)  Height:       Physical Exam  Constitutional: She is oriented to person, place, and time. She appears well-developed and well-nourished.  HENT:  Head: Normocephalic and atraumatic.  Extremely poor dentition  Cardiovascular: Normal rate and regular rhythm.  Exam reveals no gallop and no friction rub.   No murmur heard. Respiratory: Effort normal and breath sounds normal. No respiratory distress. She has no wheezes.  GI: Soft. Bowel sounds are normal. She exhibits no distension. There is no tenderness.  Bowel sounds are more active today than prior examinations  Musculoskeletal: She exhibits no edema.  Neurological: She is alert and oriented to person, place, and time.     Assessment/Plan: Mrs. Kosch is a80 year old female with a past medical history of insulin-dependent diabetes mellitus, complicatedby gastroparesis, depression, hypertension and chronic abdominal pain associated with nausea and vomiting who presents with intractable nausea and vomiting.  In summary, patient has improved symptomatically. She has had no episodes of emesis in the preceding 48 hours. She stated her nausea and vomiting and really improved and she is taking good oral intake. She was able to walk around yesterday and we'll discharge her  today.  1.Gastroparesis Consistent with her history of gastroparesis that is uncontrolled; patient has been out of medications for at least a month. She has multiple returns to the ED for similar episodes.  --Metoclopramide 10 mg IV scheduled every 8 hours, Zofran 4 mg IV scheduled every 8 hours -- Advance diet as tolerated -- Physical therapy evaluation and treat -- Complete abdominal ultrasound without pathology  2. T2DM Patient with uncontrolled T2DM noncompliant with Lantus 20U qhs due to not being able to afford medicine. Last A1c on 11/28/2014 was 8.2.  --SSI --restart lantus if tolerating food  3. Chronic iron-deficiency anemia Patient with Hgb of 9.2 at admission (b/l 8-10) supposed to be on iron therapy at home.  --ferritin 7 --resume FeSO4 325mg  daily  4. CAD Patient with history of MI s/p angioplasty supposed to be managed with lipitor 80mg  dialy, losartan 25mg  daily, ASA 81mg  daily. --resume lipitor and losartan  5. Gastritis History of gastritis on omeprazole 40mg  daily. --continue omeprazole 40mg  daily.   6. DVT/PE prophylaxis  -- Lovenox 40 mg subcutaneous injection once daily  Dispo: Anticipated discharge this afternoon.   Ophelia Shoulder, MD 01/25/2016, 7:17 AM Pager: [phone]

## 2016-01-25 NOTE — Progress Notes (Signed)
PT Cancellation Note  Patient Details Name: KOURTLYN ABERG MRN: [id number] DOB: 1975/05/22   Cancelled Treatment:    Reason Eval/Treat Not Completed: PT screened, no needs identified, will sign off   Laneah Luft 01/25/2016, 4:01 PM Pager [phone]

## 2016-01-31 ENCOUNTER — Ambulatory Visit: Payer: Medicaid Other | Admitting: Pharmacist

## 2016-01-31 ENCOUNTER — Ambulatory Visit (INDEPENDENT_AMBULATORY_CARE_PROVIDER_SITE_OTHER): Payer: Self-pay | Admitting: Internal Medicine

## 2016-01-31 VITALS — BP 142/90 | HR 104 | Temp 99.1°F | Wt 214.9 lb

## 2016-01-31 DIAGNOSIS — D509 Iron deficiency anemia, unspecified: Secondary | ICD-10-CM

## 2016-01-31 DIAGNOSIS — Z79899 Other long term (current) drug therapy: Secondary | ICD-10-CM

## 2016-01-31 DIAGNOSIS — E1142 Type 2 diabetes mellitus with diabetic polyneuropathy: Secondary | ICD-10-CM

## 2016-01-31 DIAGNOSIS — Z5189 Encounter for other specified aftercare: Secondary | ICD-10-CM

## 2016-01-31 DIAGNOSIS — Z597 Insufficient social insurance and welfare support: Secondary | ICD-10-CM

## 2016-01-31 DIAGNOSIS — Z9114 Patient's other noncompliance with medication regimen: Secondary | ICD-10-CM

## 2016-01-31 DIAGNOSIS — K3184 Gastroparesis: Secondary | ICD-10-CM

## 2016-01-31 DIAGNOSIS — E1165 Type 2 diabetes mellitus with hyperglycemia: Secondary | ICD-10-CM

## 2016-01-31 DIAGNOSIS — E1143 Type 2 diabetes mellitus with diabetic autonomic (poly)neuropathy: Secondary | ICD-10-CM

## 2016-01-31 DIAGNOSIS — Z87891 Personal history of nicotine dependence: Secondary | ICD-10-CM

## 2016-01-31 LAB — POCT GLYCOSYLATED HEMOGLOBIN (HGB A1C): HEMOGLOBIN A1C: 7.7

## 2016-01-31 LAB — GLUCOSE, CAPILLARY: GLUCOSE-CAPILLARY: 256 mg/dL — AB (ref 65–99)

## 2016-01-31 MED ORDER — INSULIN GLARGINE 100 UNIT/ML SOLOSTAR PEN: 10.0000 [IU] | PEN_INJECTOR | Freq: Every day | SUBCUTANEOUS | 1 refills | Status: DC

## 2016-01-31 MED ORDER — INSULIN PEN NEEDLE 31G X 5 MM MISC: 1 refills | Status: DC

## 2016-01-31 NOTE — Progress Notes (Signed)
Medication Samples have been provided to the patient.  Drug: Nexium (Esomeprazole)     Strength: 20mg   Qty: 14  LOT: [id number]     Exp.Date: 06/2017 Dosing instructions: Take 1 tablet po prn once daily for acid reflux    The patient has been instructed regarding the correct time, dose, and frequency of taking this medication, including desired effects and most common side effects.   Samples signed for by Dr. Flossie Dibble, PharmD  Shirley [name] 12:26 PM 01/31/2016    Medication Samples have been provided to the patient.  Drug: Acetaminophen  Strength: 500mg  Qty: 100  LOT: [id number] Exp.Date: 07/2017 Dosing instructions: Take 1-2 tablets po prn Q6H for pain    The patient has been instructed regarding the correct time, dose, and frequency of taking this medication, including desired effects and most common side effects.   Samples signed for by Dr. Flossie Dibble, PharmD   Shirley [name] 12:30 PM 01/31/2016    Medication Samples have been provided to the patient.  Drug: Ferrous Gluconate Strength: 324mg  Qty: 100 Tablets LOT: [id number]  Exp.Date: 02/2018 Dosing instructions: Take 1 tablet po once daily   The patient has been instructed regarding the correct time, dose, and frequency of taking this medication, including desired effects and most common side effects.   Samples signed for by Dr. Flossie Dibble, PharmD   Shirley [name] 12:32 PM 01/31/2016    Medication Samples have been provided to the patient.  Drug: Lantus Solostar Pen Strength: 100-U  Qty: 1 Pen LOT: [id number]   Exp.Date: 05/2018 Dosing instructions: Inject 10 units under the skin once daily   The patient has been instructed regarding the correct time, dose, and frequency of taking this medication, including desired effects and most common side effects.   Samples signed for by Dr. Flossie Dibble, PharmD  Shirley [name] 12:35 PM 01/31/2016

## 2016-01-31 NOTE — Progress Notes (Signed)
CC: Patient is here for hospital follow-up of diabetes, diabetic gastroparesis, and chronic iron deficiency anemia.  HPI:  [name] is a 40 y.o. female with a past medical history of conditions listed below presenting to the clinic for hospital follow-up of diabetes, diabetic gastroparesis, and chronic iron deficiency anemia. Please see problem based charting for the status of the patient's current and chronic medical conditions.   Past Medical History:  Diagnosis Date  . Abscess of tunica vaginalis    10/09- Abundant S. aureus- sensitive to all abx  . Anxiety   . Blood dyscrasia   . CAD (coronary artery disease) 06/15/2006   s/p Subendocardial MI with PDA angioplasty(no stent) on 06/15/06 and relook  cath 06/19/06 showed patency of site. Cath 12/10- no restenosis or significant CAD progression  . CVA (cerebral vascular accident) ([place]) ~ 02/2014   denies residual on 04/22/2014  . CVA (cerebral vascular accident) [facility])    history of remote right cerebellar infarct noted on head CT at least since 10/2011  . Depression   . Diabetes mellitus type 2, uncontrolled, with complications ([place] [facility])   . Fibromyalgia   . Gastritis   . Gastroparesis    secondary to poorly controlled DM, last emptying study performed 01/2010  was normal but may be falsely positive as pt was on reglan  . GERD (gastroesophageal reflux disease)   . Hepatitis B, chronic (HCC)    Hep BeAb+,Hep B cAb+ & Hep BsAg+ (9/06)  . History of pyelonephritis    H/o GrpB Pyelonephritis (9/06) and UTI- 07/11- E.Coli, 12/10- GBS  . Hyperlipidemia   . Hypertension   . Iron deficiency anemia   . Irregular menses    Small ovarian follicles seen on [id number])  . MI (myocardial infarction) 05/2006   PDA percutaneous transluminal coronary angioplasty  . Migraine    "weekly" (04/22/2014)  . N&V (nausea and vomiting)    Chronic. Unclear etiology with multiple admission and ED visits. CT abdomen with and without contrast (02/2011)   showed no acute process. Gastic Emptying scan (01/2010) was normal. Ultrasound of the abdomen was within normal limits. Hepatitis B viral load was undectable. HIV NR. EGD - gastritis, Hpylori + s/p Rx  . Obesity   . OSA (obstructive sleep apnea)    "suppose to wear mask but I don't" (04/22/2014)  . Peripheral neuropathy ([place])   . Pneumonia    "this is probably the 2nd or 3rd time I've had pneumonia" (04/22/2014)  . Recurrent boils   . Seasonal asthma   . Substance abuse   . Thrombocytosis (HCC)    Hem/Onc suggested 2/2 chronic hepatits and/or iron deficiency anemia    Review of Systems:  Pertinent positives mentioned in HPI. Remainder of all ROS negative.   Physical Exam:  Vitals:   01/31/16 1015  BP: (!) 142/90  Pulse: (!) 104  Temp: 99.1 F (37.3 C)  TempSrc: Oral  SpO2: 100%  Weight: 214 lb 14.4 oz (97.5 kg)   Physical Exam  Constitutional: She is oriented to person, place, and time. She appears well-developed and well-nourished. No distress.  HENT:  Head: Normocephalic and atraumatic.  Mouth/Throat: Oropharynx is clear and moist.  Eyes: EOM are normal.  Neck: Neck supple. No tracheal deviation present.  Cardiovascular: Normal rate, regular rhythm and intact distal pulses.   Pulmonary/Chest: Effort normal and breath sounds normal. No respiratory distress.  Abdominal: Soft. Bowel sounds are normal.  Abdomen mildly tender to palpation in the left lower quadrant. No rebound,  guarding, or rigidity.  Musculoskeletal: Normal range of motion. She exhibits no edema.  Neurological: She is alert and oriented to person, place, and time.  Skin: Skin is warm and dry.    Assessment & Plan:   See Encounters Tab for problem based charting.  Patient discussed with Dr. Dareen Piano

## 2016-01-31 NOTE — Assessment & Plan Note (Addendum)
History of present illness Patient was recently discharged from the hospital after being treated for nausea and vomiting secondary to diabetic gastroparesis. She was discharged with Reglan and Phenergan which she has not started taking yet. Patient states she does not have insurance and is not able to afford her medications. She continues to complain of nausea and vomiting, however, is able to tolerate by mouth intake. Reports eating noodle soups.  Assessment Diabetic gastroparesis. Patient has a history of poorly controlled diabetes (A1c 7.7 at this visit). She is nonadherent to medications secondary to financial difficulties. As per chart, gastric emptying study done in 2011 was normal but could be falsely negative as patient was on Reglan.   Plan -Continue Reglan and Phenergan -Dr. Maudie Mercury is helping the patient enroll in [facility] program -I have message Bonna Gains to see if patient would qualify for an orange card

## 2016-01-31 NOTE — Assessment & Plan Note (Signed)
HPI Patient reports feeling tired. States she is not taking her iron supplement.  Assessment Chronic iron deficiency anemia. Iron saturation was 4% in December 2016. Ferritin 7 on 01/20/2016. Hemoglobin was 9.2 on 01/23/2016.  Plan -Advised patient to start taking ferrous sulfate 325 mg daily

## 2016-01-31 NOTE — Progress Notes (Signed)
Patient was seen in clinic with Darcella Cheshire, PharmD candidate. I agree with the assessment and plan of care documented.  Patient was referred to [facility] which has all of her medications on formulary except metoclopramide. Patient was advised to contact us if further concerns arise. Patient verbalized understanding.  Patient also asked about over-the-counter options for menstrual cramps. Recommended acetaminophen and provided patient education.

## 2016-01-31 NOTE — Patient Instructions (Addendum)
Shirley [name] it was nice meeting you today.  -Start taking Reglan and Promethazine as instructed  -Start using Lantus 10 units at bedtime  -Check your blood sugar 3 times a day before meals  -Start taking ferrous sulfate as instructed  -Our pharmacist is currently working on helping you get your medications.  -Please return to the clinic in 2 weeks for a follow-up. Please bring your meter and all your medications to this visit.

## 2016-01-31 NOTE — Assessment & Plan Note (Addendum)
History of present illness Current medication regimen includes Lantus 20 units daily at bedtime. Patient states she has not used Lantus in over a year and does not check her blood sugar. States she is not able to afford her medications.  Assessment Uncontrolled type 2 diabetes. A1c was 8.2 in August 2016 and is 7.7 now. Patient is nonadherent to medications due to financial difficulties.  Plan -Dr. Maudie Mercury is helping the patient enroll in [facility] program -Start Lantus 10 units at bedtime. Per Epic documentation, patient was unable to tolerate metformin in the past. -Advised patient to check her blood glucose 3 times a day before meals -Order for retinal funduscopy -Return to clinic in 2 weeks with meter

## 2016-02-01 ENCOUNTER — Encounter: Payer: Self-pay | Admitting: Internal Medicine

## 2016-02-02 NOTE — Progress Notes (Signed)
Internal Medicine Clinic Attending  Case discussed with Dr. Rathoreat the time of the visit. We reviewed the resident's history and exam and pertinent patient test results. I agree with the assessment, diagnosis, and plan of care documented in the resident's note.  

## 2016-02-05 ENCOUNTER — Encounter (HOSPITAL_COMMUNITY): Payer: Self-pay | Admitting: *Deleted

## 2016-02-05 ENCOUNTER — Emergency Department (HOSPITAL_COMMUNITY): Payer: Self-pay

## 2016-02-05 ENCOUNTER — Inpatient Hospital Stay (HOSPITAL_COMMUNITY)
Admission: EM | Admit: 2016-02-05 | Discharge: 2016-02-07 | DRG: 074 | Disposition: A | Discharge status: Home / Self Care | Payer: Self-pay | Attending: Internal Medicine | Admitting: Internal Medicine

## 2016-02-05 ENCOUNTER — Other Ambulatory Visit: Payer: Self-pay

## 2016-02-05 DIAGNOSIS — E1165 Type 2 diabetes mellitus with hyperglycemia: Secondary | ICD-10-CM | POA: Diagnosis present

## 2016-02-05 DIAGNOSIS — K219 Gastro-esophageal reflux disease without esophagitis: Secondary | ICD-10-CM | POA: Diagnosis present

## 2016-02-05 DIAGNOSIS — Z7982 Long term (current) use of aspirin: Secondary | ICD-10-CM

## 2016-02-05 DIAGNOSIS — G4733 Obstructive sleep apnea (adult) (pediatric): Secondary | ICD-10-CM | POA: Diagnosis present

## 2016-02-05 DIAGNOSIS — K3184 Gastroparesis: Secondary | ICD-10-CM | POA: Diagnosis present

## 2016-02-05 DIAGNOSIS — I252 Old myocardial infarction: Secondary | ICD-10-CM

## 2016-02-05 DIAGNOSIS — K2970 Gastritis, unspecified, without bleeding: Secondary | ICD-10-CM | POA: Diagnosis present

## 2016-02-05 DIAGNOSIS — Z885 Allergy status to narcotic agent status: Secondary | ICD-10-CM

## 2016-02-05 DIAGNOSIS — F329 Major depressive disorder, single episode, unspecified: Secondary | ICD-10-CM | POA: Diagnosis present

## 2016-02-05 DIAGNOSIS — F419 Anxiety disorder, unspecified: Secondary | ICD-10-CM | POA: Diagnosis present

## 2016-02-05 DIAGNOSIS — E1142 Type 2 diabetes mellitus with diabetic polyneuropathy: Secondary | ICD-10-CM | POA: Diagnosis present

## 2016-02-05 DIAGNOSIS — Z79899 Other long term (current) drug therapy: Secondary | ICD-10-CM

## 2016-02-05 DIAGNOSIS — I2510 Atherosclerotic heart disease of native coronary artery without angina pectoris: Secondary | ICD-10-CM | POA: Diagnosis present

## 2016-02-05 DIAGNOSIS — I10 Essential (primary) hypertension: Secondary | ICD-10-CM | POA: Diagnosis present

## 2016-02-05 DIAGNOSIS — Z833 Family history of diabetes mellitus: Secondary | ICD-10-CM

## 2016-02-05 DIAGNOSIS — D509 Iron deficiency anemia, unspecified: Secondary | ICD-10-CM | POA: Diagnosis present

## 2016-02-05 DIAGNOSIS — M797 Fibromyalgia: Secondary | ICD-10-CM | POA: Diagnosis present

## 2016-02-05 DIAGNOSIS — J45998 Other asthma: Secondary | ICD-10-CM | POA: Diagnosis present

## 2016-02-05 DIAGNOSIS — Z955 Presence of coronary angioplasty implant and graft: Secondary | ICD-10-CM

## 2016-02-05 DIAGNOSIS — Z794 Long term (current) use of insulin: Secondary | ICD-10-CM

## 2016-02-05 DIAGNOSIS — E1143 Type 2 diabetes mellitus with diabetic autonomic (poly)neuropathy: Principal | ICD-10-CM | POA: Diagnosis present

## 2016-02-05 DIAGNOSIS — T450X6A Underdosing of antiallergic and antiemetic drugs, initial encounter: Secondary | ICD-10-CM | POA: Diagnosis present

## 2016-02-05 DIAGNOSIS — Z91120 Patient's intentional underdosing of medication regimen due to financial hardship: Secondary | ICD-10-CM

## 2016-02-05 DIAGNOSIS — Z8249 Family history of ischemic heart disease and other diseases of the circulatory system: Secondary | ICD-10-CM

## 2016-02-05 DIAGNOSIS — B181 Chronic viral hepatitis B without delta-agent: Secondary | ICD-10-CM | POA: Diagnosis present

## 2016-02-05 DIAGNOSIS — E669 Obesity, unspecified: Secondary | ICD-10-CM | POA: Diagnosis present

## 2016-02-05 DIAGNOSIS — E785 Hyperlipidemia, unspecified: Secondary | ICD-10-CM | POA: Diagnosis present

## 2016-02-05 DIAGNOSIS — T383X6A Underdosing of insulin and oral hypoglycemic [antidiabetic] drugs, initial encounter: Secondary | ICD-10-CM | POA: Diagnosis present

## 2016-02-05 DIAGNOSIS — Z888 Allergy status to other drugs, medicaments and biological substances status: Secondary | ICD-10-CM

## 2016-02-05 DIAGNOSIS — Z8673 Personal history of transient ischemic attack (TIA), and cerebral infarction without residual deficits: Secondary | ICD-10-CM

## 2016-02-05 DIAGNOSIS — E876 Hypokalemia: Secondary | ICD-10-CM | POA: Diagnosis present

## 2016-02-05 DIAGNOSIS — Z6831 Body mass index (BMI) 31.0-31.9, adult: Secondary | ICD-10-CM

## 2016-02-05 LAB — URINALYSIS, ROUTINE W REFLEX MICROSCOPIC
BILIRUBIN URINE: NEGATIVE
GLUCOSE, UA: 250 mg/dL — AB
Ketones, ur: 15 mg/dL — AB
Leukocytes, UA: NEGATIVE
Nitrite: NEGATIVE
PROTEIN: 30 mg/dL — AB
Specific Gravity, Urine: 1.016 (ref 1.005–1.030)
pH: 7 (ref 5.0–8.0)

## 2016-02-05 LAB — BASIC METABOLIC PANEL
Anion gap: 10 (ref 5–15)
BUN: 12 mg/dL (ref 6–20)
CALCIUM: 9.7 mg/dL (ref 8.9–10.3)
CO2: 23 mmol/L (ref 22–32)
CREATININE: 0.91 mg/dL (ref 0.44–1.00)
Chloride: 104 mmol/L (ref 101–111)
GLUCOSE: 148 mg/dL — AB (ref 65–99)
Potassium: 3.3 mmol/L — ABNORMAL LOW (ref 3.5–5.1)
Sodium: 137 mmol/L (ref 135–145)

## 2016-02-05 LAB — I-STAT TROPONIN, ED: TROPONIN I, POC: 0 ng/mL (ref 0.00–0.08)

## 2016-02-05 LAB — GLUCOSE, CAPILLARY
GLUCOSE-CAPILLARY: 189 mg/dL — AB (ref 65–99)
Glucose-Capillary: 155 mg/dL — ABNORMAL HIGH (ref 65–99)
Glucose-Capillary: 123 mg/dL — ABNORMAL HIGH (ref 65–99)
Glucose-Capillary: 86 mg/dL (ref 65–99)

## 2016-02-05 LAB — CBC
HCT: 30 % — ABNORMAL LOW (ref 36.0–46.0)
Hemoglobin: 9.4 g/dL — ABNORMAL LOW (ref 12.0–15.0)
MCH: 19.5 pg — AB (ref 26.0–34.0)
MCHC: 31.3 g/dL (ref 30.0–36.0)
MCV: 62.4 fL — ABNORMAL LOW (ref 78.0–100.0)
PLATELETS: 706 10*3/uL — AB (ref 150–400)
RBC: 4.81 MIL/uL (ref 3.87–5.11)
RDW: 22.4 % — ABNORMAL HIGH (ref 11.5–15.5)
WBC: 15.6 10*3/uL — ABNORMAL HIGH (ref 4.0–10.5)

## 2016-02-05 LAB — URINE MICROSCOPIC-ADD ON: WBC UA: NONE SEEN WBC/hpf (ref 0–5)

## 2016-02-05 LAB — I-STAT BETA HCG BLOOD, ED (MC, WL, AP ONLY)

## 2016-02-05 LAB — LIPASE, BLOOD: Lipase: 32 U/L (ref 11–51)

## 2016-02-05 MED ORDER — POLYETHYLENE GLYCOL 3350 17 G PO PACK: 17.0000 g | PACK | Freq: Every day | ORAL | Status: DC | PRN

## 2016-02-05 MED ORDER — METOCLOPRAMIDE HCL 5 MG/ML IJ SOLN
10.0000 mg | Freq: Once | INTRAMUSCULAR | Status: AC
  Administered 2016-02-05: 10 mg via INTRAVENOUS
  Filled 2016-02-05: qty 2

## 2016-02-05 MED ORDER — POTASSIUM CHLORIDE CRYS ER 20 MEQ PO TBCR
40.0000 meq | EXTENDED_RELEASE_TABLET | Freq: Once | ORAL | Status: AC
  Administered 2016-02-05: 40 meq via ORAL
  Filled 2016-02-05: qty 2

## 2016-02-05 MED ORDER — SODIUM CHLORIDE 0.9 % IV SOLN
INTRAVENOUS | Status: DC
  Administered 2016-02-05 – 2016-02-06 (×5): via INTRAVENOUS

## 2016-02-05 MED ORDER — PROMETHAZINE HCL 25 MG PO TABS: 25.0000 mg | ORAL_TABLET | Freq: Four times a day (QID) | ORAL | Status: DC | PRN

## 2016-02-05 MED ORDER — ACETAMINOPHEN 325 MG PO TABS
650.0000 mg | ORAL_TABLET | Freq: Four times a day (QID) | ORAL | Status: DC | PRN
  Administered 2016-02-06 – 2016-02-07 (×3): 650 mg via ORAL
  Filled 2016-02-05 (×3): qty 2

## 2016-02-05 MED ORDER — DIPHENHYDRAMINE HCL 50 MG/ML IJ SOLN
25.0000 mg | Freq: Once | INTRAMUSCULAR | Status: AC
  Administered 2016-02-05: 25 mg via INTRAVENOUS
  Filled 2016-02-05: qty 1

## 2016-02-05 MED ORDER — ONDANSETRON HCL 4 MG/2ML IJ SOLN
4.0000 mg | Freq: Three times a day (TID) | INTRAMUSCULAR | Status: DC
  Administered 2016-02-05 – 2016-02-06 (×5): 4 mg via INTRAVENOUS
  Filled 2016-02-05 (×5): qty 2

## 2016-02-05 MED ORDER — SODIUM CHLORIDE 0.9 % IV BOLUS (SEPSIS)
1000.0000 mL | Freq: Once | INTRAVENOUS | Status: AC
  Administered 2016-02-05: 1000 mL via INTRAVENOUS

## 2016-02-05 MED ORDER — POTASSIUM CHLORIDE IN NACL 40-0.9 MEQ/L-% IV SOLN
INTRAVENOUS | Status: DC
  Administered 2016-02-05: 125 mL/h via INTRAVENOUS
  Filled 2016-02-05: qty 1000

## 2016-02-05 MED ORDER — METOCLOPRAMIDE HCL 5 MG/ML IJ SOLN
10.0000 mg | Freq: Three times a day (TID) | INTRAMUSCULAR | Status: DC
  Administered 2016-02-05 – 2016-02-06 (×5): 10 mg via INTRAVENOUS
  Filled 2016-02-05 (×5): qty 2

## 2016-02-05 MED ORDER — FAMOTIDINE IN NACL 20-0.9 MG/50ML-% IV SOLN
20.0000 mg | Freq: Every day | INTRAVENOUS | Status: DC
  Filled 2016-02-05 (×3): qty 50

## 2016-02-05 MED ORDER — LOSARTAN POTASSIUM 50 MG PO TABS
25.0000 mg | ORAL_TABLET | Freq: Every day | ORAL | Status: DC
  Administered 2016-02-05 – 2016-02-07 (×3): 25 mg via ORAL
  Filled 2016-02-05 (×3): qty 1

## 2016-02-05 MED ORDER — ACETAMINOPHEN 650 MG RE SUPP: 650.0000 mg | Freq: Four times a day (QID) | RECTAL | Status: DC | PRN

## 2016-02-05 MED ORDER — ENOXAPARIN SODIUM 40 MG/0.4ML ~~LOC~~ SOLN
40.0000 mg | SUBCUTANEOUS | Status: DC
  Administered 2016-02-05 – 2016-02-07 (×3): 40 mg via SUBCUTANEOUS
  Filled 2016-02-05 (×3): qty 0.4

## 2016-02-05 MED ORDER — METOCLOPRAMIDE HCL 5 MG/ML IJ SOLN: 10.0000 mg | Freq: Three times a day (TID) | INTRAMUSCULAR | Status: DC

## 2016-02-05 MED ORDER — KETOROLAC TROMETHAMINE 30 MG/ML IJ SOLN
30.0000 mg | Freq: Once | INTRAMUSCULAR | Status: AC
  Administered 2016-02-05: 30 mg via INTRAVENOUS
  Filled 2016-02-05: qty 1

## 2016-02-05 MED ORDER — SENNA 8.6 MG PO TABS
1.0000 | ORAL_TABLET | Freq: Two times a day (BID) | ORAL | Status: DC
  Administered 2016-02-05 – 2016-02-07 (×5): 8.6 mg via ORAL
  Filled 2016-02-05 (×5): qty 1

## 2016-02-05 MED ORDER — ONDANSETRON 4 MG PO TBDP
4.0000 mg | ORAL_TABLET | Freq: Once | ORAL | Status: AC
  Administered 2016-02-05: 4 mg via ORAL

## 2016-02-05 MED ORDER — INSULIN ASPART 100 UNIT/ML ~~LOC~~ SOLN
0.0000 [IU] | SUBCUTANEOUS | Status: DC
  Administered 2016-02-05 (×2): 2 [IU] via SUBCUTANEOUS
  Administered 2016-02-06: 7 [IU] via SUBCUTANEOUS

## 2016-02-05 MED ORDER — ATORVASTATIN CALCIUM 80 MG PO TABS
80.0000 mg | ORAL_TABLET | Freq: Every day | ORAL | Status: DC
  Administered 2016-02-05 – 2016-02-06 (×2): 80 mg via ORAL
  Filled 2016-02-05 (×2): qty 1

## 2016-02-05 MED ORDER — FERROUS SULFATE 325 (65 FE) MG PO TABS
325.0000 mg | ORAL_TABLET | Freq: Every day | ORAL | Status: DC
  Administered 2016-02-05 – 2016-02-07 (×3): 325 mg via ORAL
  Filled 2016-02-05 (×3): qty 1

## 2016-02-05 MED ORDER — METOCLOPRAMIDE HCL 10 MG PO TABS: 10.0000 mg | ORAL_TABLET | Freq: Three times a day (TID) | ORAL | Status: DC

## 2016-02-05 MED ORDER — HYDROMORPHONE HCL 1 MG/ML IJ SOLN
1.0000 mg | Freq: Once | INTRAMUSCULAR | Status: AC
  Administered 2016-02-05: 1 mg via INTRAVENOUS
  Filled 2016-02-05: qty 1

## 2016-02-05 MED ORDER — PANTOPRAZOLE SODIUM 40 MG PO TBEC
80.0000 mg | DELAYED_RELEASE_TABLET | Freq: Every day | ORAL | Status: DC
  Administered 2016-02-05 – 2016-02-06 (×2): 80 mg via ORAL
  Filled 2016-02-05 (×3): qty 2

## 2016-02-05 MED ORDER — INSULIN GLARGINE 100 UNIT/ML SOLOSTAR PEN: 10.0000 [IU] | PEN_INJECTOR | Freq: Every day | SUBCUTANEOUS | Status: DC

## 2016-02-05 MED ORDER — ONDANSETRON 4 MG PO TBDP
ORAL_TABLET | ORAL | Status: AC
  Filled 2016-02-05: qty 1

## 2016-02-05 MED ORDER — DOCUSATE SODIUM 100 MG PO CAPS
100.0000 mg | ORAL_CAPSULE | Freq: Two times a day (BID) | ORAL | Status: DC
  Administered 2016-02-05 – 2016-02-07 (×5): 100 mg via ORAL
  Filled 2016-02-05 (×5): qty 1

## 2016-02-05 MED ORDER — PROMETHAZINE HCL 25 MG/ML IJ SOLN
12.5000 mg | Freq: Once | INTRAMUSCULAR | Status: AC
  Administered 2016-02-05: 12.5 mg via INTRAVENOUS
  Filled 2016-02-05: qty 1

## 2016-02-05 MED ORDER — KETOROLAC TROMETHAMINE 15 MG/ML IJ SOLN
15.0000 mg | Freq: Three times a day (TID) | INTRAMUSCULAR | Status: DC | PRN
  Administered 2016-02-05 – 2016-02-07 (×5): 15 mg via INTRAVENOUS
  Filled 2016-02-05 (×5): qty 1

## 2016-02-05 NOTE — ED Triage Notes (Signed)
Pt reports n/v, CP starting a few hours ago. Pt seen for exact same symptoms 5 days ago.

## 2016-02-05 NOTE — ED Notes (Signed)
Attempted report 

## 2016-02-05 NOTE — ED Provider Notes (Signed)
[facility] DEPT Provider Note   CSN: [id number] Arrival date & time: 02/05/16  0017    By signing my name below, I, Eunice Blase, attest that this documentation has been prepared under the direction and in the presence of Veryl Speak, MD. Electronically signed, Eunice Blase, ED Scribe. 02/05/16. 3:04 AM.  History   Chief Complaint Chief Complaint  Patient presents with  . Abdominal Pain  . Chest Pain   The history is provided by the patient. No language interpreter was used.  HPI Comments: Shirley [name] is a 40 y.o. female with PMHx of IDDM who presents to the Emergency Department complaining of constant lower abdominal pain x 2 days. Per triage, she reports nausea and vomiting. She reports no alleviating or exacerbating factors. The pt notes that her PCP is in internal medicine at [facility]. The pt was seen in the ED for similar symptoms 01/20/16.  Past Medical History:  Diagnosis Date  . Abscess of tunica vaginalis    10/09- Abundant S. aureus- sensitive to all abx  . Anxiety   . Blood dyscrasia   . CAD (coronary artery disease) 06/15/2006   s/p Subendocardial MI with PDA angioplasty(no stent) on 06/15/06 and relook  cath 06/19/06 showed patency of site. Cath 12/10- no restenosis or significant CAD progression  . CVA (cerebral vascular accident) ([place]) ~ 02/2014   denies residual on 04/22/2014  . CVA (cerebral vascular accident) [facility])    history of remote right cerebellar infarct noted on head CT at least since 10/2011  . Depression   . Diabetes mellitus type 2, uncontrolled, with complications ([place])   . Fibromyalgia   . Gastritis   . Gastroparesis    secondary to poorly controlled DM, last emptying study performed 01/2010  was normal but may be falsely positive as pt was on reglan  . GERD (gastroesophageal reflux disease)   . Hepatitis B, chronic (HCC)    Hep BeAb+,Hep B cAb+ & Hep BsAg+ (9/06)  . History of pyelonephritis    H/o GrpB Pyelonephritis (9/06) and UTI-  07/11- E.Coli, 12/10- GBS  . Hyperlipidemia   . Hypertension   . Iron deficiency anemia   . Irregular menses    Small ovarian follicles seen on 99991111)  . MI (myocardial infarction) 05/2006   PDA percutaneous transluminal coronary angioplasty  . Migraine    "weekly" (04/22/2014)  . N&V (nausea and vomiting)    Chronic. Unclear etiology with multiple admission and ED visits. CT abdomen with and without contrast (02/2011)  showed no acute process. Gastic Emptying scan (01/2010) was normal. Ultrasound of the abdomen was within normal limits. Hepatitis B viral load was undectable. HIV NR. EGD - gastritis, Hpylori + s/p Rx  . Obesity   . OSA (obstructive sleep apnea)    "suppose to wear mask but I don't" (04/22/2014)  . Peripheral neuropathy ([place])   . Pneumonia    "this is probably the 2nd or 3rd time I've had pneumonia" (04/22/2014)  . Recurrent boils   . Seasonal asthma   . Substance abuse   . Thrombocytosis ([place])    Hem/Onc suggested 2/2 chronic hepatits and/or iron deficiency anemia    Patient Active Problem List   Diagnosis Date Noted  . Non-intractable cyclical vomiting with nausea   . Dehydration 03/27/2015  . Intractable nausea and vomiting 11/11/2014  . Gastroparesis 11/11/2014  . Cough   . Hematemesis 10/13/2014  . DKA (diabetic ketoacidoses) ([place]) 10/13/2014  . Increased anion gap metabolic acidosis 0000000  . Tachycardia  07/21/2014  . Abdominal pain, chronic, left lower quadrant   . Nausea with vomiting   . Diabetic gastroparesis associated with type 2 diabetes mellitus ([place]) 04/28/2014  . History of Helicobacter pylori infection 04/22/2014  . Vaginal discharge 02/18/2014  . Atypical chest pain 07/22/2013  . Unspecified constipation 07/21/2013  . Tinea corporis 07/21/2013  . Intractable vomiting 04/29/2013  . Dysmenorrhea 04/22/2013  . Headache(784.0) 02/08/2012  . Health care maintenance 01/22/2012  . Chronic hepatitis B ([place]) 03/07/2011  . History of  leukocytosis 04/06/2010  . THROMBOCYTOSIS 04/06/2010  . Polysubstance abuse 02/23/2010  . Iron deficiency anemia 11/22/2009  . PERIPHERAL NEUROPATHY 10/01/2009  . Hyperlipidemia 08/30/2009  . POLYNEUROPATHY, DIABETIC 08/30/2009  . Hidradenitis (recurrent boils) 07/07/2008  . Depression 12/27/2007  . Abdominal pain, left lower quadrant 11/21/2007  . FIBROMYALGIA 10/30/2007  . BACK PAIN 04/01/2007  . OBSTRUCTIVE SLEEP APNEA 01/17/2007  . ANXIETY DEPRESSION 06/27/2006  . Chronic ischemic heart disease 06/15/2006  . OBESITY, MORBID 05/15/2006  . MIGRAINE HEADACHE 05/15/2006  . Asthma 05/15/2006  . Essential hypertension 01/16/2006  . IRREGULAR MENSTRUATION 01/16/2006  . PEDAL EDEMA 01/16/2006  . Poorly controlled type 2 diabetes mellitus with peripheral neuropathy ([place]) 01/16/1989    Past Surgical History:  Procedure Laterality Date  . CESAREAN SECTION  1997  . CORONARY ANGIOPLASTY WITH STENT PLACEMENT  2008   "2 stents"  . ESOPHAGOGASTRODUODENOSCOPY N/A 04/23/2014   Procedure: ESOPHAGOGASTRODUODENOSCOPY (EGD);  Surgeon: Winfield Cunas., MD;  Location: [facility] ENDOSCOPY;  Service: Endoscopy;  Laterality: N/A;    OB History    No data available       Home Medications    Prior to Admission medications   Medication Sig Start Date End Date Taking? Authorizing Provider  albuterol (PROVENTIL HFA;VENTOLIN HFA) 108 (90 BASE) MCG/ACT inhaler Inhale 2 puffs into the lungs every 4 (four) hours as needed for wheezing or shortness of breath. Patient not taking: Reported on 01/20/2016 04/26/14   Florinda Marker, MD  aspirin EC 81 MG EC tablet Take 1 tablet (81 mg total) by mouth daily. Patient not taking: Reported on 01/20/2016 11/29/15   Holley Raring, MD  atorvastatin (LIPITOR) 80 MG tablet Take 1 tablet (80 mg total) by mouth daily at 6 PM. Patient not taking: Reported on 01/20/2016 04/02/15   Maryellen Pile, MD  ferrous sulfate 325 (65 FE) MG tablet Take 1 tablet (325 mg total) by mouth daily  with breakfast. Patient not taking: Reported on 01/20/2016 04/02/15   Maryellen Pile, MD  Insulin Glargine (LANTUS SOLOSTAR) 100 UNIT/ML Solostar Pen Inject 10 Units into the skin daily at 10 pm. 01/31/16   Shela Leff, MD  Insulin Pen Needle 31G X 5 MM MISC Use to inject Lantus at bedtime. 01/31/16   Shela Leff, MD  losartan (COZAAR) 25 MG tablet Take 1 tablet (25 mg total) by mouth daily. Patient not taking: Reported on 01/20/2016 11/29/15   Holley Raring, MD  metoCLOPramide (REGLAN) 10 MG tablet Take 1 tablet (10 mg total) by mouth 3 (three) times daily as needed for nausea (headache / nausea). Patient not taking: Reported on 01/20/2016 01/09/16   Nona Dell, PA-C  omeprazole (PRILOSEC) 40 MG capsule Take 1 capsule (40 mg total) by mouth daily. Patient not taking: Reported on 01/20/2016 04/02/15   Maryellen Pile, MD  promethazine (PHENERGAN) 25 MG tablet Take 1 tablet (25 mg total) by mouth every 6 (six) hours as needed for nausea or vomiting. Patient not taking: Reported on 01/20/2016 12/07/15  Shary Decamp, PA-C    Family History Family History  Problem Relation Age of Onset  . Diabetes Father     Social History Social History  Substance Use Topics  . Smoking status: Former Smoker    Types: Cigarettes    Quit date: 04/24/1996  . Smokeless tobacco: Never Used     Comment: quit smoking cigarettes age 51  . Alcohol use 0.0 oz/week     Comment: 04/22/2014 "might have a few drinks a month"     Allergies   Lisinopril and Morphine and related   Review of Systems Review of Systems  Gastrointestinal: Positive for abdominal pain.  All other systems reviewed and are negative.    Physical Exam Updated Vital Signs BP (!) 163/114 (BP Location: Left Arm) Comment: Nurse notified Pt also vomiting  Pulse (!) 149 Comment: Nurse notified  Temp 98.8 F (37.1 C) (Oral)   Resp 20   LMP 01/09/2016 (Approximate)   SpO2 100%   Physical Exam  Constitutional: She is oriented  to person, place, and time. She appears well-developed and well-nourished. No distress.  Pt appears anxious and is writhing.  HENT:  Head: Normocephalic and atraumatic.  Eyes: EOM are normal.  Neck: Normal range of motion.  Cardiovascular: Normal rate, regular rhythm and normal heart sounds.   Pulmonary/Chest: Effort normal and breath sounds normal.  Abdominal: Soft. She exhibits no distension. There is tenderness. There is no rebound and no guarding.  TTP across the upper abdomen.  Musculoskeletal: Normal range of motion.  Neurological: She is alert and oriented to person, place, and time.  Skin: Skin is warm and dry.  Psychiatric: She has a normal mood and affect. Judgment normal.  Nursing note and vitals reviewed.    ED Treatments / Results   DIAGNOSTIC STUDIES: Oxygen Saturation is 100% on RA, normal by my interpretation.    COORDINATION OF CARE: 3:04 AM Discussed treatment plan with pt at bedside and pt agreed to plan.    Labs (all labs ordered are listed, but only abnormal results are displayed) Labs Reviewed  BASIC METABOLIC PANEL - Abnormal; Notable for the following:       Result Value   Potassium 3.3 (*)    Glucose, Bld 148 (*)    All other components within normal limits  CBC - Abnormal; Notable for the following:    WBC 15.6 (*)    Hemoglobin 9.4 (*)    HCT 30.0 (*)    MCV 62.4 (*)    MCH 19.5 (*)    RDW 22.4 (*)    Platelets 706 (*)    All other components within normal limits  LIPASE, BLOOD  URINALYSIS, ROUTINE W REFLEX MICROSCOPIC (NOT AT [facility])  I-STAT TROPOININ, ED  I-STAT BETA HCG BLOOD, ED ([facility], [facility], [facility] ONLY)    EKG  EKG Interpretation None       Radiology Dg Chest 2 View  Result Date: 02/05/2016 CLINICAL DATA:  40 y/o F; nausea, vomiting, and chest pain starting a few hours ago. EXAM: CHEST  2 VIEW COMPARISON:  01/19/2016 chest radiating FINDINGS: The heart size and mediastinal contours are within normal limits and stable. Both lungs are  clear. The visualized skeletal structures are unremarkable. IMPRESSION: No active cardiopulmonary disease. Electronically Signed   By: Kristine Garbe M.D.   On: 02/05/2016 01:01    Procedures Procedures (including critical care time)  Medications Ordered in ED Medications  ondansetron (ZOFRAN-ODT) 4 MG disintegrating tablet (not administered)  ondansetron (ZOFRAN-ODT) disintegrating tablet  4 mg (4 mg Oral Given 02/05/16 0027)     Initial Impression / Assessment and Plan / ED Course  I have reviewed the triage vital signs and the nursing notes.  Pertinent labs & imaging results that were available during my care of the patient were reviewed by me and considered in my medical decision making (see chart for details).  Clinical Course    Patient presents with vomiting. She has a history of gastroparesis and this feels the same. She was given IV fluids, anti-medics, pain medications, however is not feeling better. I've spoken with Dr. Benjamine Mola from the teaching service. He will evaluate the patient and determine the final disposition.  Final Clinical Impressions(s) / ED Diagnoses   Final diagnoses:  None    New Prescriptions New Prescriptions   No medications on file     I personally performed the services described in this documentation, which was scribed in my presence. The recorded information has been reviewed and is accurate.       Veryl Speak, MD 02/05/16 [phone]

## 2016-02-05 NOTE — Progress Notes (Signed)
   Subjective:  Shirley [name] was feeling a [place] better this morning after receiving medications. Still having nausea but has not had any further vomiting. Complains of some LLQ abdominal pain but improved from prior. She says that despite getting the [facility] letter at discharge at her prior hospitalization she was unable to afford the $3 mediations. I had spoken with her at discharge previously and she assured me that she would be able to afford her medications at that time so it is unclear what changed. She had insurance issues that was prohibiting her from getting the [facility] and tells me she called about getting that resolved but has not been called back.   Objective:  Vital signs in last 24 hours: Vitals:   02/05/16 0315 02/05/16 0430 02/05/16 0615 02/05/16 0820  BP: 169/94 116/62 180/94 (!) 153/82  Pulse: 100 118 102 89  Resp:    19  Temp:    98 F (36.7 C)  TempSrc:    Oral  SpO2: 100% 100% 100% 100%  Weight:    203 lb 8 oz (92.3 kg)  Height:    5\' 7"  (1.702 m)   GENERAL- obese female lying in bed in no acute distress  CARDIAC- mildly tachycardic, regular rhythm, no murmurs, rubs or gallops. RESP- Moving equal volumes of air, and clear to auscultation bilaterally, no wheezes or crackles. ABDOMEN- Soft, nontender, bowel sounds present. EXTREMITIES- pulse 2+, symmetric, no pedal edema. SKIN- Warm, dry, No rash or lesion. PSYCH- Normal mood and affect, appropriate thought content and speech   Assessment/Plan:  Diabetic Gastroparesis Her nausea and vomiting are improving with Reglan and Zofran. This is very challenging. She has numerous admissions for this problem with improvement in symptoms when resuming her home medications. She continues to have difficulty getting her medications outpatient. Attempts to arrange this at discharge have been made at every admission. Including [facility] letters, our clinic providing money for medications, attempts to get [employer], etc. None of this  has been successful thus far. Will continue to treat her symptomatically and expect discharge in 1-2 days.  - Reglan 10 mg q8hr - Zofran 4mg  q8hr  - Toradol prn - IV NS 125 cc/hr  Hypokalemia. Likely secondary to above. K 3.3. -KDur 40 mEq today -BMET in am  Uncontrolled Type II diabetes HgbA1C 7.7. Per last clinic note on 01/31/2016 Patient had not been taking her insulin, Lantus 20 units, at that point for over a year due to cost.  Patient was able to attain insulin after her most recent clinic visit and has been taking lantus 10 units at night as prescribed for the past 4 days. - Lantus 10 units qhs  - SSI      Coronary Artery Disease Patient with a history of MI, PDA occulusion, s/p angioplasty,  on lipitor 80mg  daily, losartan 25mg , ASA 81mg  daily.   - Restart lipitor, ASA and losartan  Chronic Iron deficiency anemia Ferritin 7 on 01/20/2016 and Hemoglobin was 9.4 on 02/05/2016.  Patient has not been taking her ferrous sulfate. - Ferrous Sulfate  Gastritis History of gastritis on omeprazole 40mg  daily. -Protonix  Dispo: Anticipated discharge in approximately 1-2 day(s).   Maryellen Pile, MD 02/05/2016, 11:13 AM Pager: [phone]

## 2016-02-05 NOTE — H&P (Signed)
Date: 02/05/2016               Patient Name:  Shirley [name] MRN: [id number]  DOB: [birth year] Age / Sex: 40 y.o., female   PCP: Collier Salina, MD         Medical Service: Internal Medicine Teaching Service         Attending Physician: Dr. Sid Falcon, MD    First Contact: Dr. Ophelia Shoulder Pager: [phone]  Second Contact: Dr. Maryellen Pile Pager: [phone]       After Hours (After 5p/  First Contact Pager: [phone]  weekends / holidays): Second Contact Pager: [phone]   Chief Complaint: Vomiting  History of Present Illness:  Shirley [name] is a 40 year old female with PMHx of uncontrolled type II diabetes, diabetic gastroparesis, myocardial infarction s/p angioplasty 2008 and chronic iron deficiency anemia that presents to the ED with a two day history of vomiting.  She states she was at home during the day when she developed the vomiting and can not attribute it to a specific event.  She states she has had over ten episodes of non-bloody emesis over the past 2 days and has vomited twice in the ED.  Associated symptoms include nausea, headache and lower abdominal pain.  She states it is the exact same symptoms she experienced when she was admitted on 01/20/2016.  She states she has been non compliant on her medications due to not picking them up as she has a hard time affording them.  She denies fever, chills, chest pain, shortness of breath or diarrhea.       Meds:  No outpatient prescriptions have been marked as taking for the 02/05/16 encounter [facility] Encounter).     Allergies: Allergies as of 02/05/2016 - Review Complete 02/05/2016  Allergen Reaction Noted  . Lisinopril Nausea Only and Swelling 03/17/2008  . Morphine and related Itching and Swelling 04/15/2013   Past Medical History:  Diagnosis Date  . Abscess of tunica vaginalis    10/09- Abundant S. aureus- sensitive to all abx  . Anxiety   . Blood dyscrasia   . 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 Cath 12/10- no restenosis or significant CAD progression  . CVA (cerebral vascular accident) ([place]) ~ 02/2014   denies residual on 04/22/2014  . CVA (cerebral vascular accident) [facility])    history of remote right cerebellar infarct noted on head CT at least since 10/2011  . Depression   . Diabetes mellitus type 2, uncontrolled, with complications ([place])   . Fibromyalgia   . Gastritis   . Gastroparesis    secondary to poorly controlled DM, last emptying study performed 01/2010  was normal but may be falsely positive as pt was on reglan  . GERD (gastroesophageal reflux disease)   . Hepatitis B, chronic (HCC)    Hep BeAb+,Hep B cAb+ & Hep BsAg+ (9/06)  . History of pyelonephritis    H/o GrpB Pyelonephritis (9/06) and UTI- 07/11- E.Coli, 12/10- GBS  . Hyperlipidemia   . Hypertension   . Iron deficiency anemia   . Irregular menses    Small ovarian follicles seen on 99991111)  . MI (myocardial infarction) 05/2006   PDA percutaneous transluminal coronary angioplasty  . Migraine    "weekly" (04/22/2014)  . N&V (nausea and vomiting)    Chronic. Unclear etiology with multiple admission and  ED visits. CT abdomen with and without contrast (02/2011)  showed no acute process. Gastic Emptying scan (01/2010) was normal. Ultrasound of the abdomen was within normal limits. Hepatitis B viral load was undectable. HIV NR. EGD - gastritis, Hpylori + s/p Rx  . Obesity   . OSA (obstructive sleep apnea)    "suppose to wear mask but I don't" (04/22/2014)  . Peripheral neuropathy ([place])   . Pneumonia    "this is probably the 2nd or 3rd time I've had pneumonia" (04/22/2014)  . Recurrent boils   . Seasonal asthma   . Substance abuse   . Thrombocytosis ([place])    Hem/Onc suggested 2/2 chronic hepatits and/or iron deficiency anemia    Family History: Mother: Diabetes, Grandfather: myocardial  infarction  Social History: Tobacco use: denies Alcohol use: 2 drinks a month, last drink one week ago Illicit drug use:  Admits to remote illicit drug use but none recently.  Review of Systems: A complete ROS was negative except as per HPI.   Physical Exam: Blood pressure 116/62, pulse 118, temperature 97.9 F (36.6 C), temperature source Oral, resp. rate 20, last menstrual period 01/09/2016, SpO2 100 %. Vitals:   02/05/16 0239 02/05/16 0312 02/05/16 0315 02/05/16 0430  BP: (!) 163/114 (!) 202/143 169/94 116/62  Pulse: (!) 149 (!) 122 100 118  Resp: 20 20    Temp: 98.8 F (37.1 C) 97.9 F (36.6 C)    TempSrc: Oral Oral    SpO2: 100% 100% 100% 100%   General: Vital signs reviewed.  Patient rocking back and forth in the bed.  Appears somnolent Head: Normocephalic and atraumatic. Eyes: EOMI, conjunctivae normal, no scleral icterus.  Neck: Supple, trachea midline, normal ROM Cardiovascular: Regular Rhythm, tachcardic S1 normal, S2 normal, no murmurs, gallops, or rubs. Pulmonary/Chest: Clear to auscultation bilaterally, no wheezes, rales, or rhonchi. Abdominal: Soft, epigastric tenderness and left lower quadrant tenderness, non-distended, no bowel sounds present, no masses, organomegaly, or guarding present.  Musculoskeletal: No joint deformities, erythema, or stiffness, ROM full and nontender. Extremities: No lower extremity edema bilaterally,  pulses symmetric and intact bilaterally. No cyanosis or clubbing. Neurological: A&Ox3, Strength is normal and symmetric bilaterally,  no focal motor deficit, sensory intact to light touch bilaterally.  Skin: Warm, dry and intact. No rashes or erythema.  BMET    Component Value Date/Time   NA 137 02/05/2016 0029   K 3.3 (L) 02/05/2016 0029   CL 104 02/05/2016 0029   CO2 23 02/05/2016 0029   GLUCOSE 148 (H) 02/05/2016 0029   BUN 12 02/05/2016 0029   CREATININE 0.91 02/05/2016 0029   CREATININE 0.76 07/15/2012 1419   CALCIUM 9.7  02/05/2016 0029   GFRNONAA >60 02/05/2016 0029   GFRAA >60 02/05/2016 0029   CBC    Component Value Date/Time   WBC 15.6 (H) 02/05/2016 0029   RBC 4.81 02/05/2016 0029   HGB 9.4 (L) 02/05/2016 0029   HCT 30.0 (L) 02/05/2016 0029   PLT 706 (H) 02/05/2016 0029   MCV 62.4 (L) 02/05/2016 0029   MCH 19.5 (L) 02/05/2016 0029   MCHC 31.3 02/05/2016 0029   RDW 22.4 (H) 02/05/2016 0029   LYMPHSABS 2.6 01/23/2016 1149   MONOABS 1.1 (H) 01/23/2016 1149   EOSABS 0.2 01/23/2016 1149   BASOSABS 0.0 01/23/2016 1149    EKG: Sinus Tachycardia, ST elevations or T wave inversions  CXR:  Chest X-ray FINDINGS: The heart size and mediastinal contours are within normal limits and stable. Both lungs are clear. The  visualized skeletal structures are unremarkable.  IMPRESSION: No active cardiopulmonary disease.  Assessment & Plan by Problem: Diabetic Gastroparesis Patient has been having nausea and vomiting for the past 2 days as well as abdominal pain.  She states she has visited the ED and been admitted, last on 01/20/2016 for similar symptoms to which she attributes to her gastroparesis. EGD 04/23/2014 showed retained food in stomach consistent with gastroparesis, otherwise normal exam. She was seen in the Internal Medicine Clinic for her chronic medical conditions on 01/31/2016.  She was advised to continue Reglan and phenergan for her gastroparesis but has not taken them because she has not picked them up.  She is currently trying to be enrolled in the [facility] program as well as applying for an orange card as she can not afford any of her medications. - Reglan - Phenergan - toradol - IV NS with KCl 54mEq - Pepcid  Uncontrolled Type II diabetes HgbA1C 7.7. Per last clinic note on 01/31/2016 Patient had not been taking her insulin, Lantus 20 units, at that point for over a year due to cost.  Patient was able to attain insulin after her most recent clinic visit and has been taking  lantus 10 units at night as prescribed for the past 4 days. - Lantus 10 units at night  - SSI      Coronary Artery Disease Patient with a history of MI, PDA occulusion, s/p angioplasty,  on lipitor 80mg  daily, losartan 25mg , ASA 81mg  daily.   - Restart lipitor, ASA and losartan  Chronic Iron deficiency anemia Ferritin 7 on 01/20/2016 and Hemoglobin was 9.4 on 02/05/2016.  Patient has not been taking her ferrous sulfate. - Ferrous Sulfate  Gastritis History of gastritis on omeprazole 40mg  daily. -Protonix     Dispo: Admit patient to Observation with expected length of stay less than 2 midnights.  Signed: Valinda Party, DO 02/05/2016, 6:23 AM  Pager: [phone]

## 2016-02-06 DIAGNOSIS — K2970 Gastritis, unspecified, without bleeding: Secondary | ICD-10-CM

## 2016-02-06 DIAGNOSIS — Z7982 Long term (current) use of aspirin: Secondary | ICD-10-CM

## 2016-02-06 DIAGNOSIS — I2510 Atherosclerotic heart disease of native coronary artery without angina pectoris: Secondary | ICD-10-CM

## 2016-02-06 DIAGNOSIS — K3184 Gastroparesis: Secondary | ICD-10-CM

## 2016-02-06 DIAGNOSIS — Z794 Long term (current) use of insulin: Secondary | ICD-10-CM

## 2016-02-06 DIAGNOSIS — D509 Iron deficiency anemia, unspecified: Secondary | ICD-10-CM

## 2016-02-06 DIAGNOSIS — I252 Old myocardial infarction: Secondary | ICD-10-CM

## 2016-02-06 DIAGNOSIS — Z79899 Other long term (current) drug therapy: Secondary | ICD-10-CM

## 2016-02-06 DIAGNOSIS — Z955 Presence of coronary angioplasty implant and graft: Secondary | ICD-10-CM

## 2016-02-06 DIAGNOSIS — E1143 Type 2 diabetes mellitus with diabetic autonomic (poly)neuropathy: Principal | ICD-10-CM

## 2016-02-06 LAB — GLUCOSE, CAPILLARY
GLUCOSE-CAPILLARY: 164 mg/dL — AB (ref 65–99)
GLUCOSE-CAPILLARY: 311 mg/dL — AB (ref 65–99)
GLUCOSE-CAPILLARY: 99 mg/dL (ref 65–99)
Glucose-Capillary: 89 mg/dL (ref 65–99)
Glucose-Capillary: 102 mg/dL — ABNORMAL HIGH (ref 65–99)
Glucose-Capillary: 101 mg/dL — ABNORMAL HIGH (ref 65–99)

## 2016-02-06 LAB — BASIC METABOLIC PANEL
Anion gap: 5 (ref 5–15)
BUN: 13 mg/dL (ref 6–20)
CO2: 22 mmol/L (ref 22–32)
CREATININE: 0.98 mg/dL (ref 0.44–1.00)
Calcium: 8.1 mg/dL — ABNORMAL LOW (ref 8.9–10.3)
Chloride: 110 mmol/L (ref 101–111)
GFR calc Af Amer: >60 mL/min (ref >60)
Glucose, Bld: 111 mg/dL — ABNORMAL HIGH (ref 65–99)
Potassium: 3.8 mmol/L (ref 3.5–5.1)
SODIUM: 137 mmol/L (ref 135–145)

## 2016-02-06 MED ORDER — INSULIN GLARGINE 100 UNIT/ML ~~LOC~~ SOLN: 10.0000 [IU] | Freq: Every day | SUBCUTANEOUS | Status: DC

## 2016-02-06 MED ORDER — ONDANSETRON HCL 4 MG PO TABS
4.0000 mg | ORAL_TABLET | Freq: Once | ORAL | Status: AC
  Administered 2016-02-06: 4 mg via ORAL
  Filled 2016-02-06: qty 1

## 2016-02-06 MED ORDER — INSULIN ASPART 100 UNIT/ML ~~LOC~~ SOLN
0.0000 [IU] | Freq: Three times a day (TID) | SUBCUTANEOUS | Status: DC
  Administered 2016-02-06: 5 [IU] via SUBCUTANEOUS
  Administered 2016-02-07: 3 [IU] via SUBCUTANEOUS

## 2016-02-06 MED ORDER — ONDANSETRON HCL 4 MG PO TABS
4.0000 mg | ORAL_TABLET | Freq: Three times a day (TID) | ORAL | Status: AC | PRN
  Administered 2016-02-06 – 2016-02-07 (×2): 4 mg via ORAL
  Filled 2016-02-06 (×2): qty 1

## 2016-02-06 NOTE — Progress Notes (Signed)
   Subjective: Ms. Tarasa Steck Kirker has had abdominal pain and had 2 episodes of non bloody vomiting overnight.  She denies any new concerns or complaints. She says she has been tolerating liquids and is ready for a more advanced diet. She was sleeping comfortably when seen during rounds this morning.   Objective:  Vital signs in last 24 hours: Vitals:   02/05/16 0820 02/05/16 1403 02/05/16 2110 02/06/16 0514  BP: (!) 153/82 (!) 161/87 131/85 128/83  Pulse: 89 100 95 (!) 104  Resp: 19 17 18 18   Temp: 98 F (36.7 C) 98.5 F (36.9 C) 97.9 F (36.6 C) 99.1 F (37.3 C)  TempSrc: Oral  Oral Oral  SpO2: 100% 99% 100% 99%  Weight: 203 lb 8 oz (92.3 kg)     Height: 5\' 7"  (1.702 m)      Physical Exam  Constitutional: She appears well-developed and well-nourished. No distress.  Cardiovascular: Normal rate and regular rhythm.   No murmur heard. Pulmonary/Chest: Effort normal. She has no wheezes. She has no rales.  Abdominal: Soft. She exhibits no distension. There is no tenderness.  Extremities: no calf tenderness, no peripheral edema   Medications: Infusions: . sodium chloride 125 mL/hr at 02/06/16 0510   Scheduled Medications: . atorvastatin  80 mg Oral q1800  . docusate sodium  100 mg Oral BID  . enoxaparin (LOVENOX) injection  40 mg Subcutaneous Q24H  . pantoprazole  80 mg Oral Daily   Or  . famotidine (PEPCID) IV  20 mg Intravenous Daily  . ferrous sulfate  325 mg Oral Q breakfast  . insulin aspart  0-9 Units Subcutaneous Q4H  . losartan  25 mg Oral Daily  . metoCLOPramide (REGLAN) injection  10 mg Intravenous Q8H  . ondansetron (ZOFRAN) IV  4 mg Intravenous Q8H  . senna  1 tablet Oral BID   PRN Medications: acetaminophen **OR** acetaminophen, ketorolac, polyethylene glycol  Assessment/Plan: Pt is a 40 y.o. yo female with a PMHx of uncontrolled type II diabetes, diabetic gastroparesis, myocardial infarction s/p angioplasty 2008 and chronic iron deficiency anemia who was  admitted on 02/05/2016 with symptoms of gastroparesis.   Diabetic gastroparesis  Today she continues to experience abdominal pain. She does state that her nausea and vomiting have been improving. During physical exam palpation with stethoscope illicits no pain however this is not congruent with palpation with hand which illicits pain. She feels she can tolerate advancing diet from clears.  -continue reglan 10 mg q8h, zofran 4 mg q8h, and toradol PRN  - advancing diet to soft liquids, we will continue IV NS 75 cc/hr   Hypokalemia  Resolved with K-Dur, this was likely related to her recent episodes of vomiting.   Uncontrolled type 2 diabetes Blood glucose has remained within normal range.  Continue Lantus units at bedtime Continue is and ISS  Coronary artery disease Continue Lipitor 80 mg daily, losartan 25 mg, aspirin 81 mg.  Chronic iron deficiency anemia  Continue ferrous sulfate   Gastritis  She is on omepraxole 40 mg daily at home.  -Continue protonix 80 mg daily   Dispo: Anticipated discharge in approximately 1-2 day(s).   LOS: 1 day   Ledell Noss, MD 02/06/2016, 8:57 AM Pager: [phone]

## 2016-02-07 LAB — GLUCOSE, CAPILLARY
GLUCOSE-CAPILLARY: 205 mg/dL — AB (ref 65–99)
Glucose-Capillary: 185 mg/dL — ABNORMAL HIGH (ref 65–99)
Glucose-Capillary: 200 mg/dL — ABNORMAL HIGH (ref 65–99)
Glucose-Capillary: 279 mg/dL — ABNORMAL HIGH (ref 65–99)

## 2016-02-07 MED ORDER — FERROUS SULFATE 325 (65 FE) MG PO TABS: 325.0000 mg | ORAL_TABLET | Freq: Every day | ORAL | 0 refills | Status: DC

## 2016-02-07 MED ORDER — LOSARTAN POTASSIUM 25 MG PO TABS: 25.0000 mg | ORAL_TABLET | Freq: Every day | ORAL | 2 refills | Status: DC

## 2016-02-07 MED ORDER — METOCLOPRAMIDE HCL 10 MG PO TABS: 10.0000 mg | ORAL_TABLET | Freq: Four times a day (QID) | ORAL | 0 refills | Status: DC

## 2016-02-07 MED ORDER — ATORVASTATIN CALCIUM 80 MG PO TABS: 80.0000 mg | ORAL_TABLET | Freq: Every day | ORAL | 0 refills | Status: DC

## 2016-02-07 MED ORDER — PROMETHAZINE HCL 12.5 MG PO TABS: 12.5000 mg | ORAL_TABLET | Freq: Four times a day (QID) | ORAL | 0 refills | Status: DC | PRN

## 2016-02-07 NOTE — Progress Notes (Signed)
   Subjective: [name] says that she had one episode of vomiting overnight but was able to tolerate eating rice at dinner. Shirley [name] nurse reports that she was able to finish the whole dinner plate and had no concerns or complaints afterwards.  Objective:  Vital signs in last 24 hours: Vitals:   02/06/16 0514 02/06/16 1528 02/06/16 2106 02/07/16 0435  BP: 128/83 (!) 143/83 (!) 168/83 (!) 145/85  Pulse: (!) 104 (!) 101 98 96  Resp: 18 19 17    Temp: 99.1 F (37.3 C) 99 F (37.2 C) 98.7 F (37.1 C) 99 F (37.2 C)  TempSrc: Oral Oral Oral Oral  SpO2: 99% 100% 100% 100%  Weight:      Height:        Physical Exam  Constitutional: She appears well-developed and well-nourished. No distress.  Cardiovascular: Normal rate and regular rhythm.   No murmur heard. Pulmonary/Chest: She has no wheezes. She has no rales.  Abdominal: Soft. She exhibits no distension. There is no tenderness.  Extremities: no calf tenderness, no peripheral edema   Medications: Infusions: . sodium chloride 75 mL/hr at 02/06/16 2143   Scheduled Medications: . atorvastatin  80 mg Oral q1800  . docusate sodium  100 mg Oral BID  . enoxaparin (LOVENOX) injection  40 mg Subcutaneous Q24H  . pantoprazole  80 mg Oral Daily   Or  . famotidine (PEPCID) IV  20 mg Intravenous Daily  . ferrous sulfate  325 mg Oral Q breakfast  . insulin aspart  0-9 Units Subcutaneous TID WC & HS  . losartan  25 mg Oral Daily  . senna  1 tablet Oral BID   PRN Medications: acetaminophen **OR** acetaminophen, ketorolac, ondansetron, polyethylene glycol  Assessment/Plan: Pt is a 40 y.o. yo female with a PMHx of uncontrolled type II diabetes, diabetic gastroparesis, myocardial infarction s/p angioplasty 2008 and chronic iron deficiency anemia who was admitted on 02/05/2016 with symptoms of gastroparesis.   Active Problems:   Intractable nausea and vomiting  Diabetic gastroparesis Today she says her abdominal pain is  improving. She tolerated a regular dinner and has had a decrease in symptoms of nausea and vomiting with reported 1 episode overnight. She feels she is ready to home with new diet recommendations and will be able to pick up medications and [facility]  Uncontrolled type 2 diabetes We have not given her basal insulin dose while  advancing her diet. I believe this explains her elevated glucose 2-300 yesterday.  Coronary artery disease She will need to continue taking Lipitor 80 mg daily, losartan 25 mg daily, and aspirin 81 mg daily at discharge.  Chronic iron deficiency anemia She will need to continue taking her sulfate outpatient.  Gastritis She will need to continue taking omeprazole 40 mg every day  Dispo: Anticipated discharge today  LOS: 2 days   Ledell Noss, MD 02/07/2016, 6:40 AM Pager: [phone]

## 2016-02-07 NOTE — Discharge Instructions (Signed)
Thank you for trusting [place] with your medical care!  You were hospitalized for gastroparesis and treated with medications for nausea.   Please take note of the following changes to your medications: Please pick up your prescriptions for reglan and take that medication until 03/10/2016  Please pick up your prescription for promethazine (phenergan) and take it every 6 hours   To make sure you are getting better, please make it to the follow-up appointments listed on the first page.  If you have any questions, please call [phone].    Gastroparesis Gastroparesis, also called delayed gastric emptying, is a condition in which food takes longer than normal to empty from the stomach. The condition is usually long-lasting (chronic). CAUSES This condition may be caused by:  An endocrine disorder, such as hypothyroidism or diabetes. Diabetes is the most common cause of this condition.  A nervous system disease, such as Parkinson disease or multiple sclerosis.  Cancer, infection, or surgery of the stomach or vagus nerve.  A connective tissue disorder, such as scleroderma.  Certain medicines. In most cases, the cause is not known. RISK FACTORS This condition is more likely to develop in:  People with certain disorders, including endocrine disorders, eating disorders, amyloidosis, and scleroderma.  People with certain diseases, including Parkinson disease or multiple sclerosis.  People with cancer or infection of the stomach or vagus nerve.  People who have had surgery on the stomach or vagus nerve.  People who take certain medicines.  Women. SYMPTOMS Symptoms of this condition include:  An early feeling of fullness when eating.  Nausea.  Weight loss.  Vomiting.  Heartburn.  Abdominal bloating.  Inconsistent blood glucose levels.  Lack of appetite.  Acid from the stomach coming up into the esophagus (gastroesophageal reflux).  Spasms of the stomach. Symptoms may  come and go. DIAGNOSIS This condition is diagnosed with tests, such as:  Tests that check how long it takes food to move through the stomach and intestines. These tests include:  Upper gastrointestinal (GI) series. In this test, X-rays of the intestines are taken after you drink a liquid. The liquid makes the intestines show up better on the X-rays.  Gastric emptying scintigraphy. In this test, scans are taken after you eat food that contains a small amount of radioactive material.  Wireless capsule GI monitoring system. This test involves swallowing a capsule that records information about movement through the stomach.  Gastric manometry. This test measures electrical and muscular activity in the stomach. It is done with a thin tube that is passed down the throat and into the stomach.  Endoscopy. This test checks for abnormalities in the lining of the stomach. It is done with a long, thin tube that is passed down the throat and into the stomach.  An ultrasound. This test can help rule out gallbladder disease or pancreatitis as a cause of your symptoms. It uses sound waves to take pictures of the inside of your body. TREATMENT There is no cure for gastroparesis. This condition may be managed with:  Treatment of the underlying condition causing the gastroparesis.  Lifestyle changes, including exercise and dietary changes. Dietary changes can include:  Changes in what and when you eat.  Eating smaller meals more often.  Eating low-fat foods.  Eating low-fiber forms of high-fiber foods, such as cooked vegetables instead of raw vegetables.  Having liquid foods in place of solid foods. Liquid foods are easier to digest.  Medicines. These may be given to control nausea and vomiting and  to stimulate stomach muscles.  Getting food through a feeding tube. This may be done in severe cases.  A gastric neurostimulator. This is a device that is inserted into the body with surgery. It helps  improve stomach emptying and control nausea and vomiting. HOME CARE INSTRUCTIONS  Follow your health care provider's instructions about exercise and diet.  Take medicines only as directed by your health care provider. SEEK MEDICAL CARE IF:  Your symptoms do not improve with treatment.  You have new symptoms. SEEK IMMEDIATE MEDICAL CARE IF:  You have severe abdominal pain that does not improve with treatment.  You have nausea that does not go away.  You cannot keep fluids down.   This information is not intended to replace advice given to you by your health care provider. Make sure you discuss any questions you have with your health care provider.   Document Released: 04/10/2005 Document Revised: 08/25/2014 Document Reviewed: 04/06/2014 Elsevier Interactive Patient Education [employer].

## 2016-02-07 NOTE — Care Management Note (Signed)
Case Management Note  Patient Details  Name: Shirley [name] MRN: [id number] Date of Birth: [birth year]  Subjective/Objective:         Admitted with ABD pain/ symptoms of gastroparesis, hx of uncontrolled type II diabetes, diabetic gastroparesis, myocardial infarction s/p angioplasty 2008 and chronic iron deficiency anemia.        PCP: Vernelle Emerald  Action/Plan: Plan is to d/c to home today.  Expected Discharge Date:    02/07/2016            Expected Discharge Plan:  Home/Self Care  In-House Referral:     Discharge planning Services  CM Consult  Status of Service:  Completed, signed off  If discussed at [place] of Stay Meetings, dates discussed:    Additional Comments:  Sharin Mons, RN 02/07/2016, 12:09 PM

## 2016-02-07 NOTE — Progress Notes (Signed)
Pt. discharged home with instructions and education given on medication. Case manager discussed with patient on how to obtain prescription from pharmacy. Iv removed, skin intact upon discharged.

## 2016-02-07 NOTE — Discharge Summary (Signed)
Name: Shirley [name] MRN: [id number] DOB: [birth year] 40 y.o. PCP: Collier Salina, MD  Date of Admission: 02/05/2016  2:37 AM Date of Discharge: 02/07/2016 Attending Physician: Dr. Aldine Contes   Discharge Diagnosis: Active Problems:   Diabetic gastroparesis associated with type 2 diabetes mellitus ([place])   Discharge Medications:   Medication List    TAKE these medications   acetaminophen 325 MG tablet Commonly known as:  TYLENOL Take 650 mg by mouth every 6 (six) hours as needed for mild pain.   albuterol 108 (90 Base) MCG/ACT inhaler Commonly known as:  PROVENTIL HFA;VENTOLIN HFA Inhale 2 puffs into the lungs every 4 (four) hours as needed for wheezing or shortness of breath.   atorvastatin 80 MG tablet Commonly known as:  LIPITOR Take 1 tablet (80 mg total) by mouth daily at 6 PM.   ferrous sulfate 325 (65 FE) MG tablet Take 1 tablet (325 mg total) by mouth daily with breakfast.   Insulin Glargine 100 UNIT/ML Solostar Pen Commonly known as:  LANTUS SOLOSTAR Inject 10 Units into the skin daily at 10 pm.   losartan 25 MG tablet Commonly known as:  COZAAR Take 1 tablet (25 mg total) by mouth daily.   metoCLOPramide 10 MG tablet Commonly known as:  REGLAN Take 1 tablet (10 mg total) by mouth 4 (four) times daily.   promethazine 12.5 MG tablet Commonly known as:  PHENERGAN Take 1 tablet (12.5 mg total) by mouth every 6 (six) hours as needed for nausea or vomiting.       Disposition and follow-up:   Shirley [name] was discharged from [facility] in Good condition.  At the hospital follow up visit please address:  1. Gastroparesis - how are her gastroparesis symptoms? Was she able to pick up reglan and phenergan prescriptions?   2.  Labs / imaging needed at time of follow-up: CBC  3.  Pending labs/ test needing follow-up: none   Follow-up Appointments: [facility] 02/16/2016 at 2 pm   Hospital Course by  problem list: Active Problems:   Diabetic gastroparesis associated with type 2 diabetes mellitus ([place])  [name] is a 40 yo man with a past medical history of type 2 diabetes mellitus, diabetic gastroparesis, myocardial infarction, anemia who presented with 2 days of nausea and vomiting. She had previously been admitted for similar symptoms thought to be related to diabetic gastroparesis and was discharged with prescription for Phenergan and Reglan however she had difficulty with affording these prescriptions and developed symptoms of nausea and vomiting shortly after discharge. The emergency department she was found to be afebrile and hemodynamically stable. She was started on IV fluids, Reglan and Phenergan. She was admitted for management of her nausea and vomiting secondary to gastroparesis. She was made nothing by moth and her diet was slowly advanced as tolerated. On the evening before discharge she had tolerated eating a complete dinner without complaints of nausea or vomiting. She was discharged with prescriptions for reglan and promethazine and [facility].   Type 2 Diabetes Mellitus  She has a history of poorly controlled diabetes with poor access to medications because of financed. On admission HbA1c 7.7. Her diabetes was managed with sliding scale insulin. Management of her uncontrolled DM will need to be followed up outpatient.   Chronic iron deficiency anemia  On admission she had a hemoglobin of 9.4 last ferritin 01/20/2016 7. Home medication ferrous sulfate was  continued.   Coronary artery disease  She has a history of MI with PDA occlusion s/p angioplasty. Her home medications Lipitor 80 mg daily, losartan 25 mg daily, and ASA 81 mg daily were continued.   Discharge Vitals:   BP 135/87   Pulse 96   Temp 99 F (37.2 C) (Oral)   Resp 17   Ht 5\' 7"  (1.702 m)   Wt 203 lb 8 oz (92.3 kg)   LMP 01/09/2016 (Approximate)    SpO2 100%   BMI 31.87 kg/m   Procedures Performed:  Dg Chest 2 View  Result Date: 02/05/2016 CLINICAL DATA:  40 y/o F; nausea, vomiting, and chest pain starting a few hours ago. EXAM: CHEST  2 VIEW COMPARISON:  01/19/2016 chest radiating FINDINGS: The heart size and mediastinal contours are within normal limits and stable. Both lungs are clear. The visualized skeletal structures are unremarkable. IMPRESSION: No active cardiopulmonary disease. Electronically Signed   By: Kristine Garbe M.D.   On: 02/05/2016 01:01   Dg Chest 2 View  Result Date: 01/20/2016 CLINICAL DATA:  Chest pain.  Nausea and vomiting EXAM: CHEST  2 VIEW COMPARISON:  None. FINDINGS: The heart size and mediastinal contours are within normal limits. Both lungs are clear. The visualized skeletal structures are unremarkable. IMPRESSION: No active cardiopulmonary disease. Electronically Signed   By: Kerby Moors M.D.   On: 01/20/2016 00:00   US Abdomen Complete  Result Date: 01/24/2016 CLINICAL DATA:  Generalized abdominal pain for 3 years with acute worsening in the past week. Possible upper abdominal mass on physical exam. EXAM: ABDOMEN ULTRASOUND COMPLETE COMPARISON:  CT abdomen pelvis 11/27/2015. FINDINGS: Gallbladder: No gallstones or wall thickening visualized. No sonographic Murphy sign noted by sonographer. Common bile duct: Diameter: 5 mm, within normal limits. Liver: No focal lesion identified. Within normal limits in parenchymal echogenicity. IVC: No abnormality visualized. Pancreas: Visualized portion unremarkable. Spleen: 7.9 cm, within normal limits. Right Kidney: Length: 10.7 cm. Parenchymal echogenicity is within normal limits. No hydronephrosis. Left Kidney: Length: 11.5 cm. Parenchymal echogenicity is within normal limits. No hydronephrosis. No focal lesion. Abdominal aorta: No aneurysm visualized. Other findings: No mass in the upper abdomen. IMPRESSION: No findings to explain the patient's given symptoms.  Electronically Signed   By: Lorin Picket M.D.   On: 01/24/2016 12:35   Discharge Instructions: Discharge Instructions    Call MD for:  persistant dizziness or light-headedness    Complete by:  As directed    Call MD for:  severe uncontrolled pain    Complete by:  As directed    Call MD for:  temperature >100.4    Complete by:  As directed    Diet - low sodium heart healthy    Complete by:  As directed    Increase activity slowly    Complete by:  As directed       Signed: Ledell Noss, MD 02/09/2016, 3:47 PM   Pager: [phone]

## 2016-02-07 NOTE — Progress Notes (Signed)
CM called to scheduled post hospital f/u appointment @ the [facility], however, the schedule isn't open for scheduling appointments until  Monday, Oct. 23rd . At that time pt is to call and schedule f/u.  CM to f/u with pt with information.  Whitman Hero RN,BSN,CM [phone]

## 2016-02-11 ENCOUNTER — Telehealth: Payer: Self-pay | Admitting: Internal Medicine

## 2016-02-11 NOTE — Telephone Encounter (Signed)
APT. REMINDER CALL, LMTCB °

## 2016-02-14 ENCOUNTER — Ambulatory Visit: Payer: Medicaid Other

## 2016-02-16 ENCOUNTER — Inpatient Hospital Stay: Payer: Medicaid Other

## 2016-02-18 ENCOUNTER — Ambulatory Visit: Payer: Medicaid Other

## 2016-02-25 IMAGING — CR DG CHEST 2V
2 series · 2 of 2 positions shown · non-contrast
Comparison: DG ABD ACUTE W/CHEST dated 06/19/2012

CLINICAL DATA: Chest pain, shortness of breath.

EXAM:
CHEST  2 VIEW

[w chest pa]
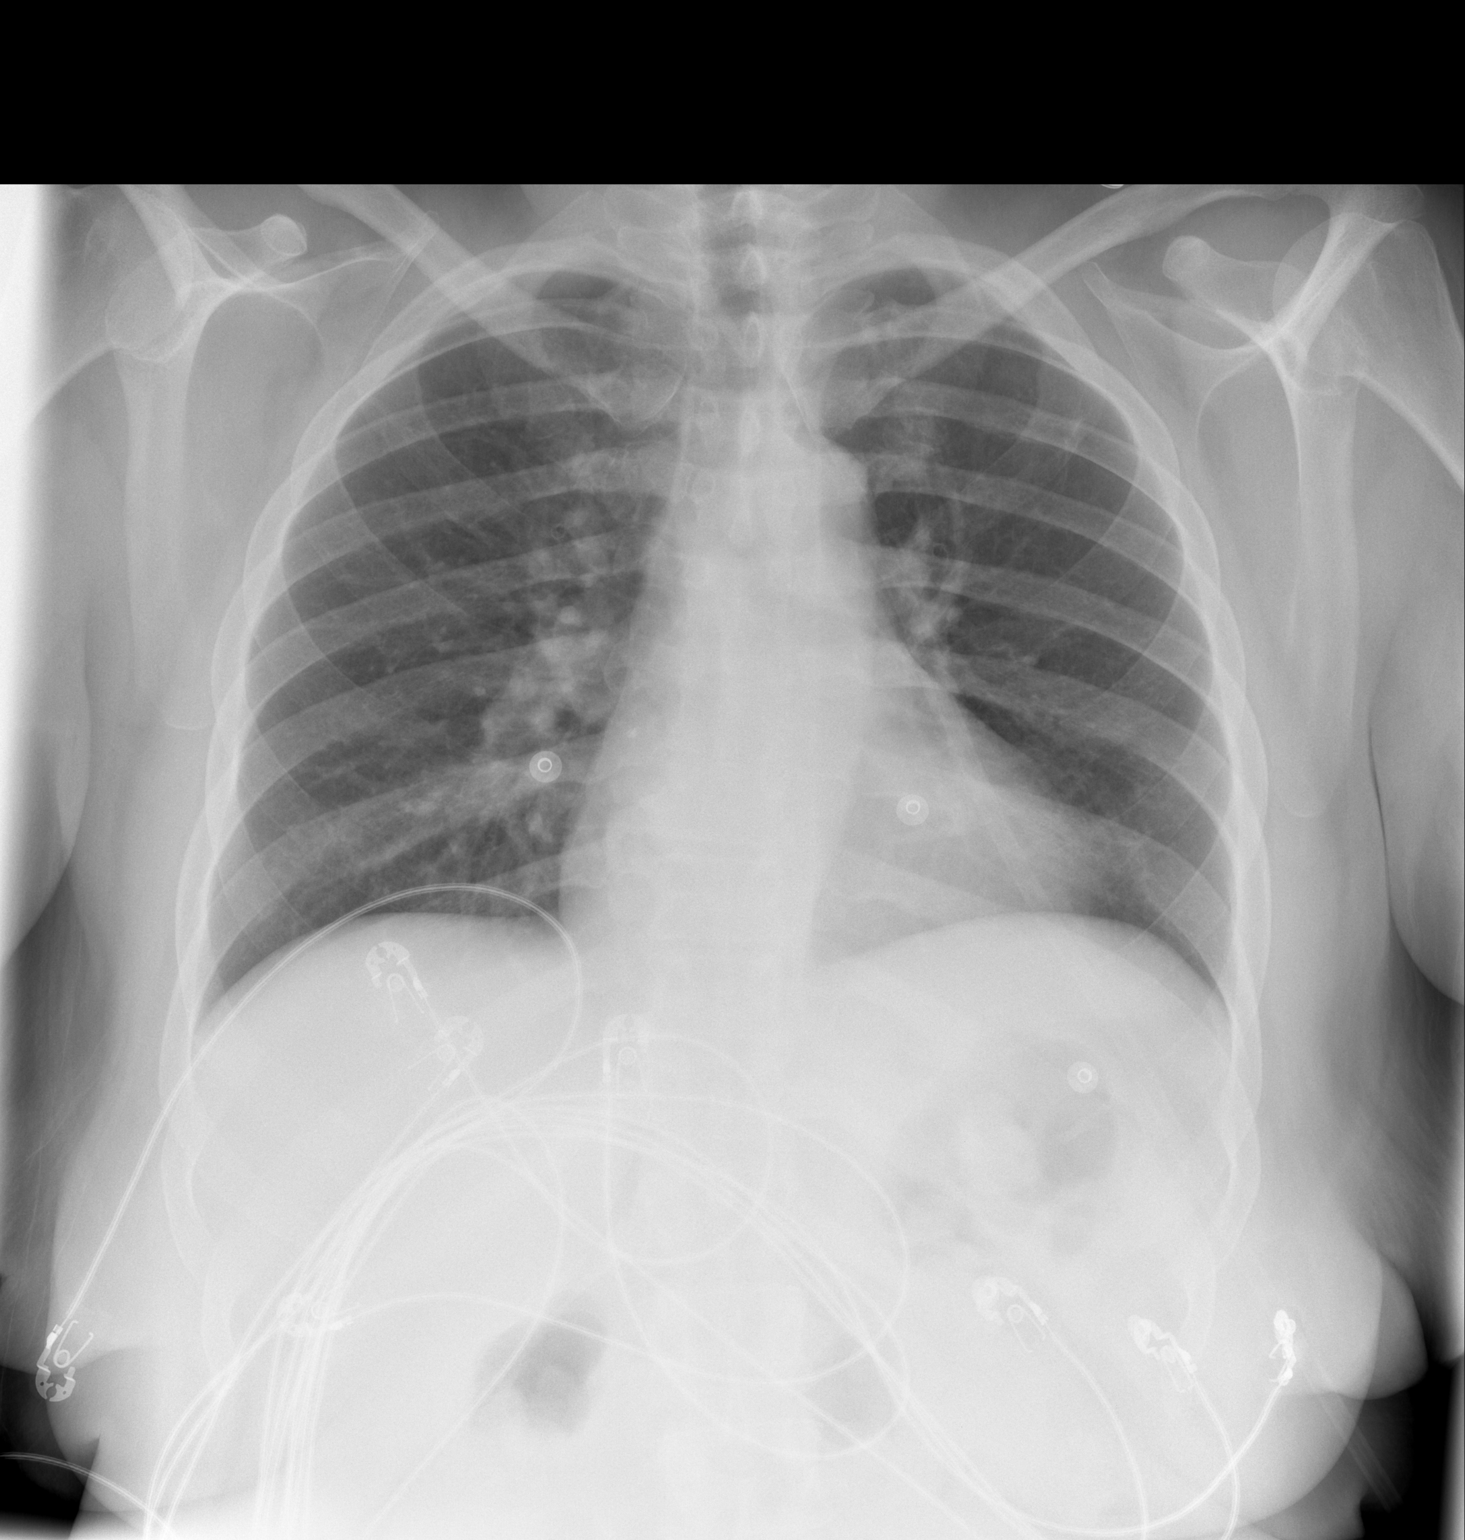

[w chest lat]
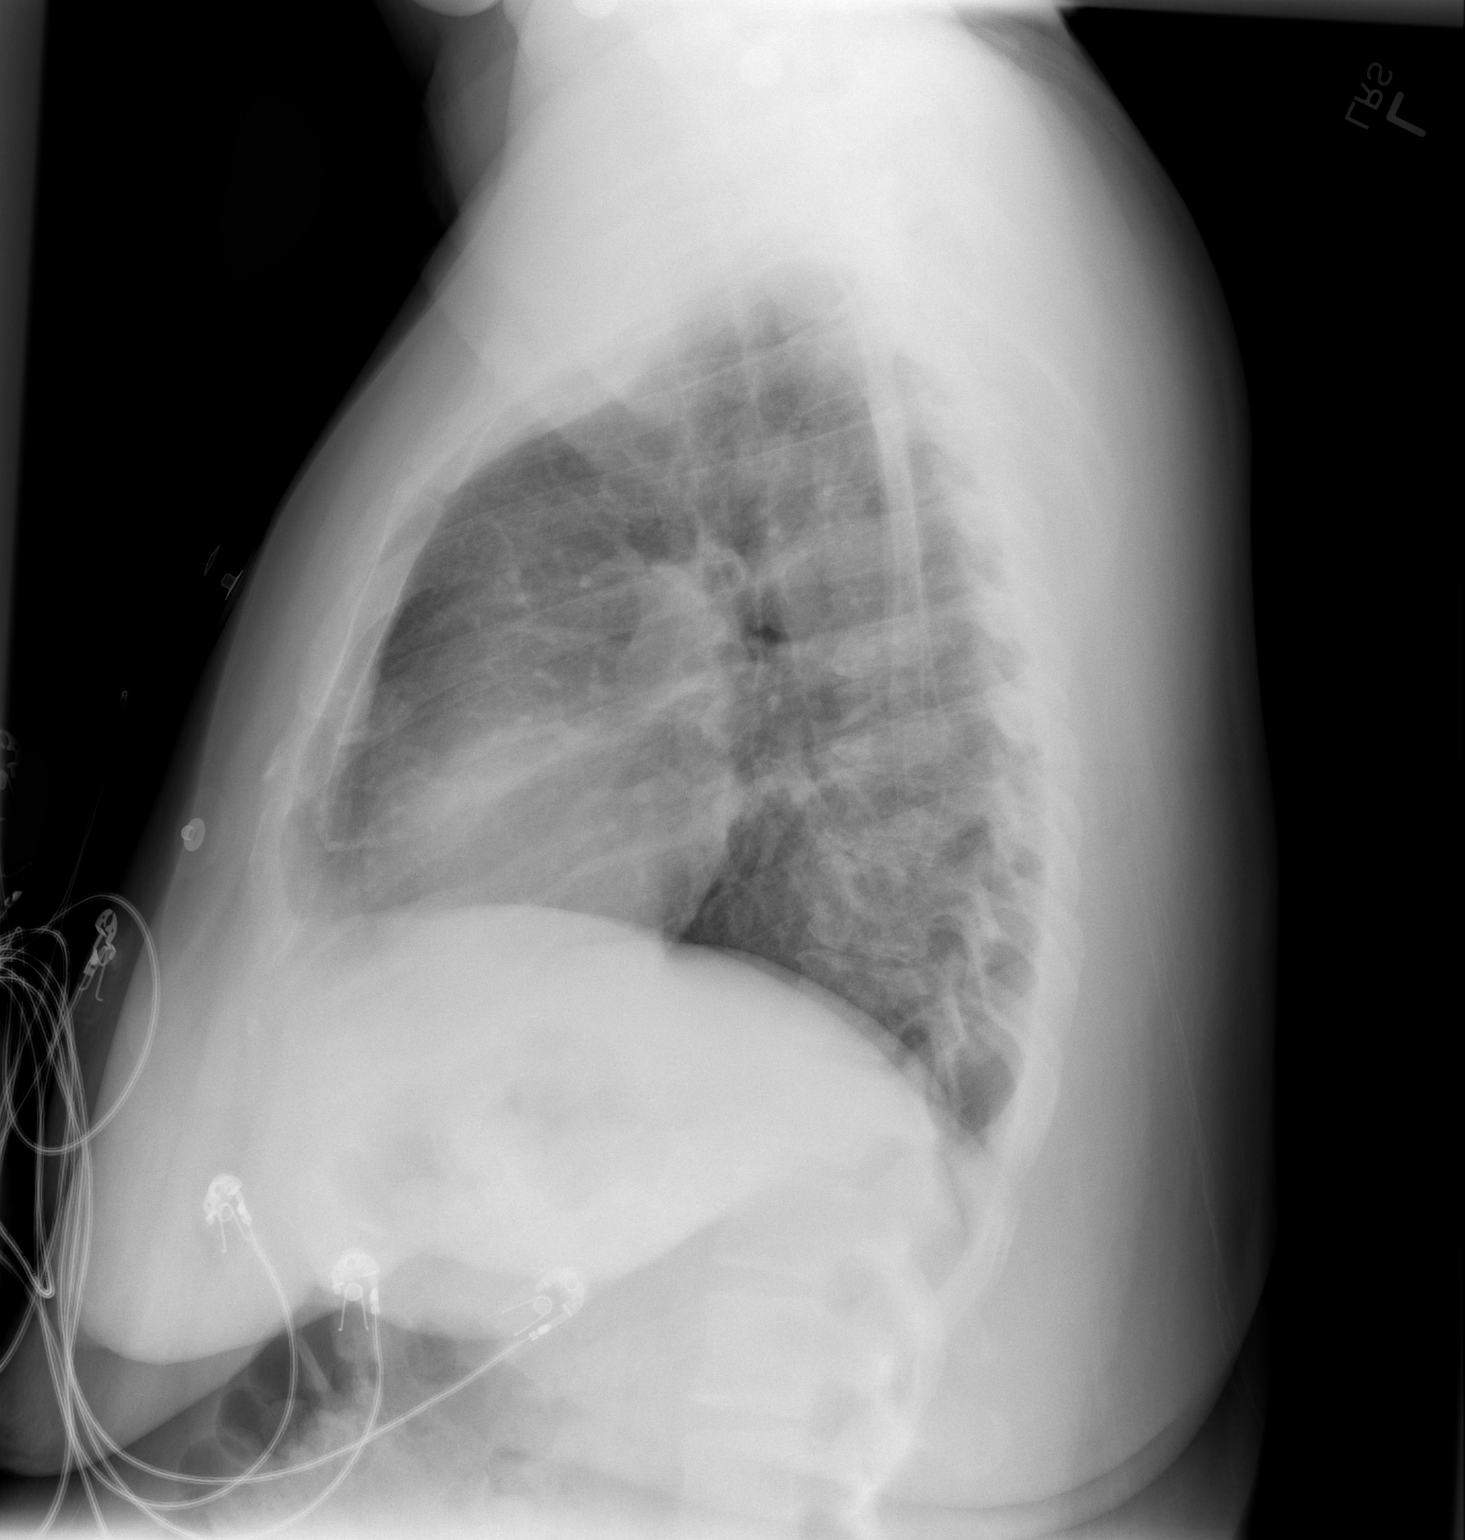

[2 of 2 positions shown; findings below may reference images not displayed]

FINDINGS: Cardiomediastinal silhouette is unremarkable. Mild central pulmonary
vasculature congestion is low inspiratory examination. The lungs are
clear without pleural effusions or focal consolidations. Trachea
projects midline and there is no pneumothorax. Soft tissue planes
and included osseous structures are non-suspicious.
IMPRESSION: Mild central pulmonary vasculature congestion.  .

  By: Savion Naeem

## 2016-03-04 IMAGING — CR DG ABDOMEN ACUTE W/ 1V CHEST
4 series · 4 of 4 positions shown · non-contrast
Comparison: None.

CLINICAL DATA: Left lower quadrant abdominal pain and constipation.
Difficulty urinating.

EXAM:
ACUTE ABDOMEN SERIES (ABDOMEN 2 VIEW & CHEST 1 VIEW)

[w chest pa]
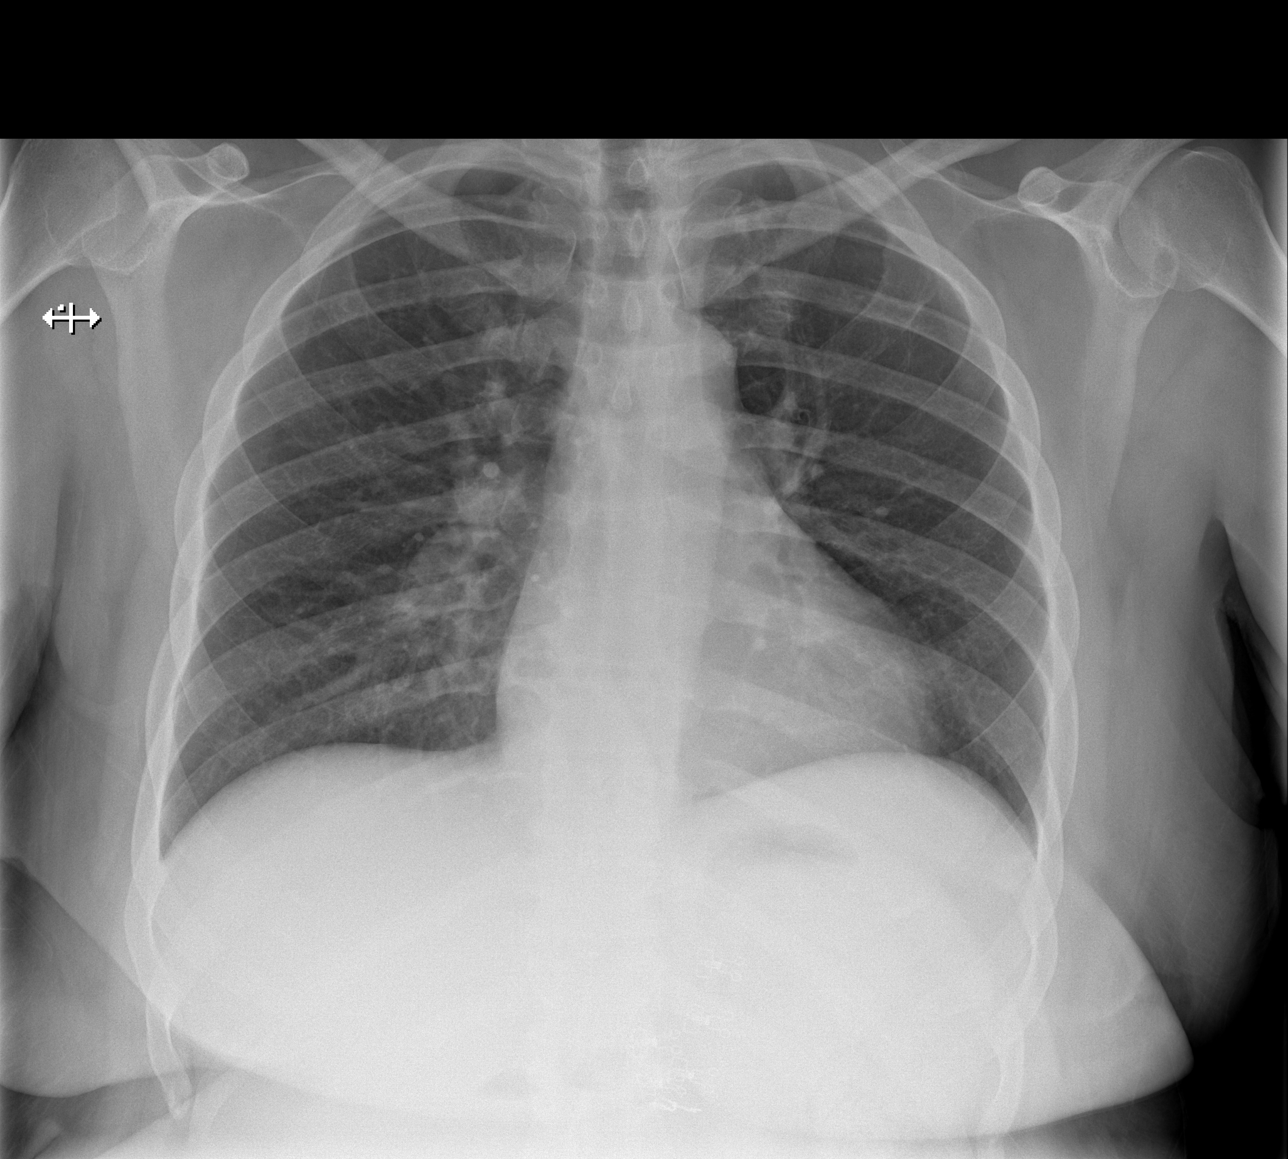

[x abdomen decub]
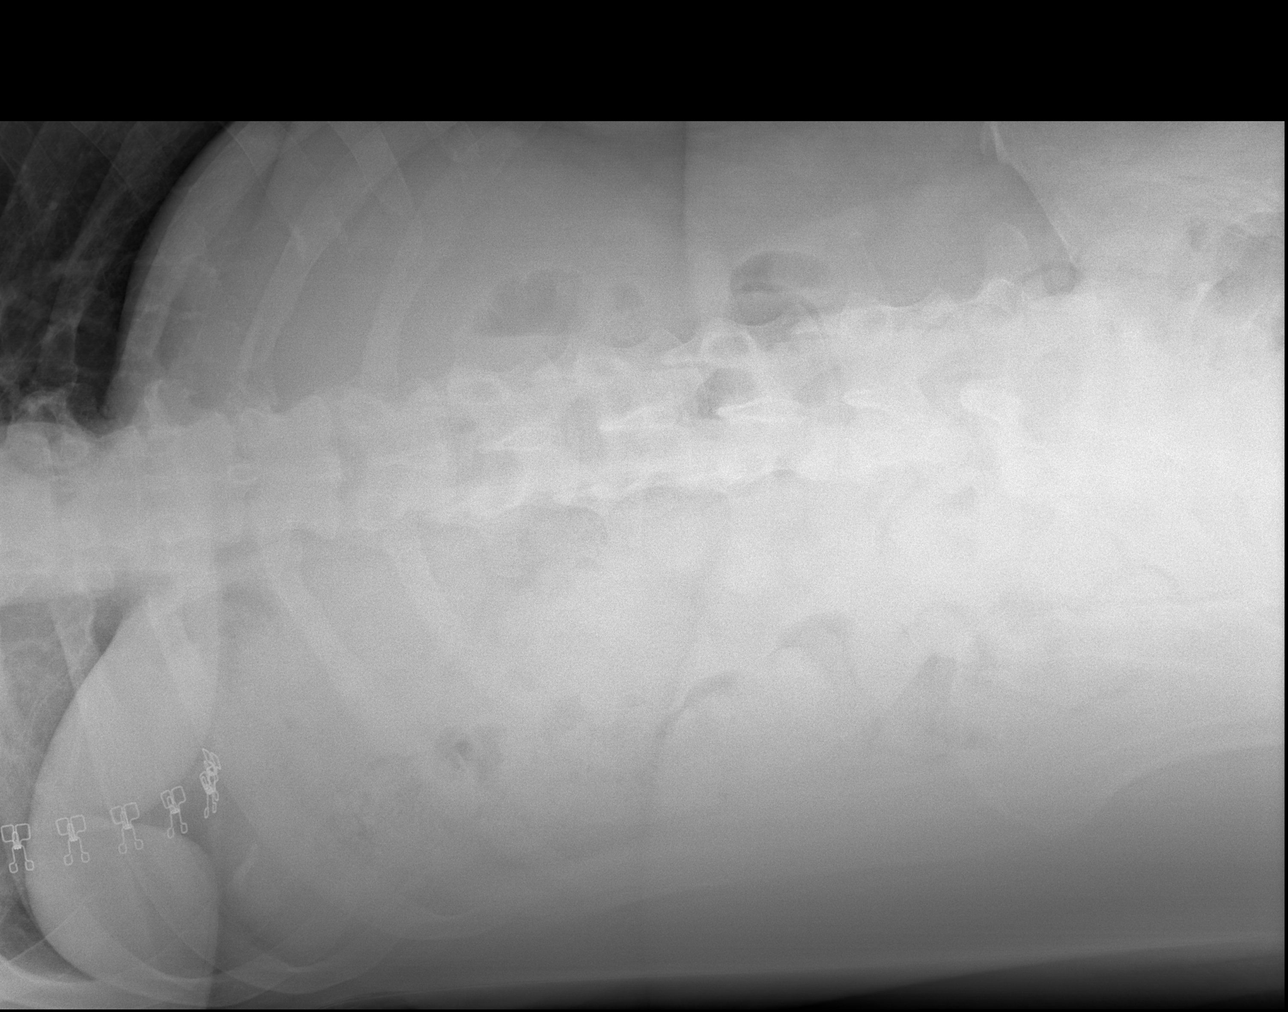

[t abdomen supine (1 of 2)]
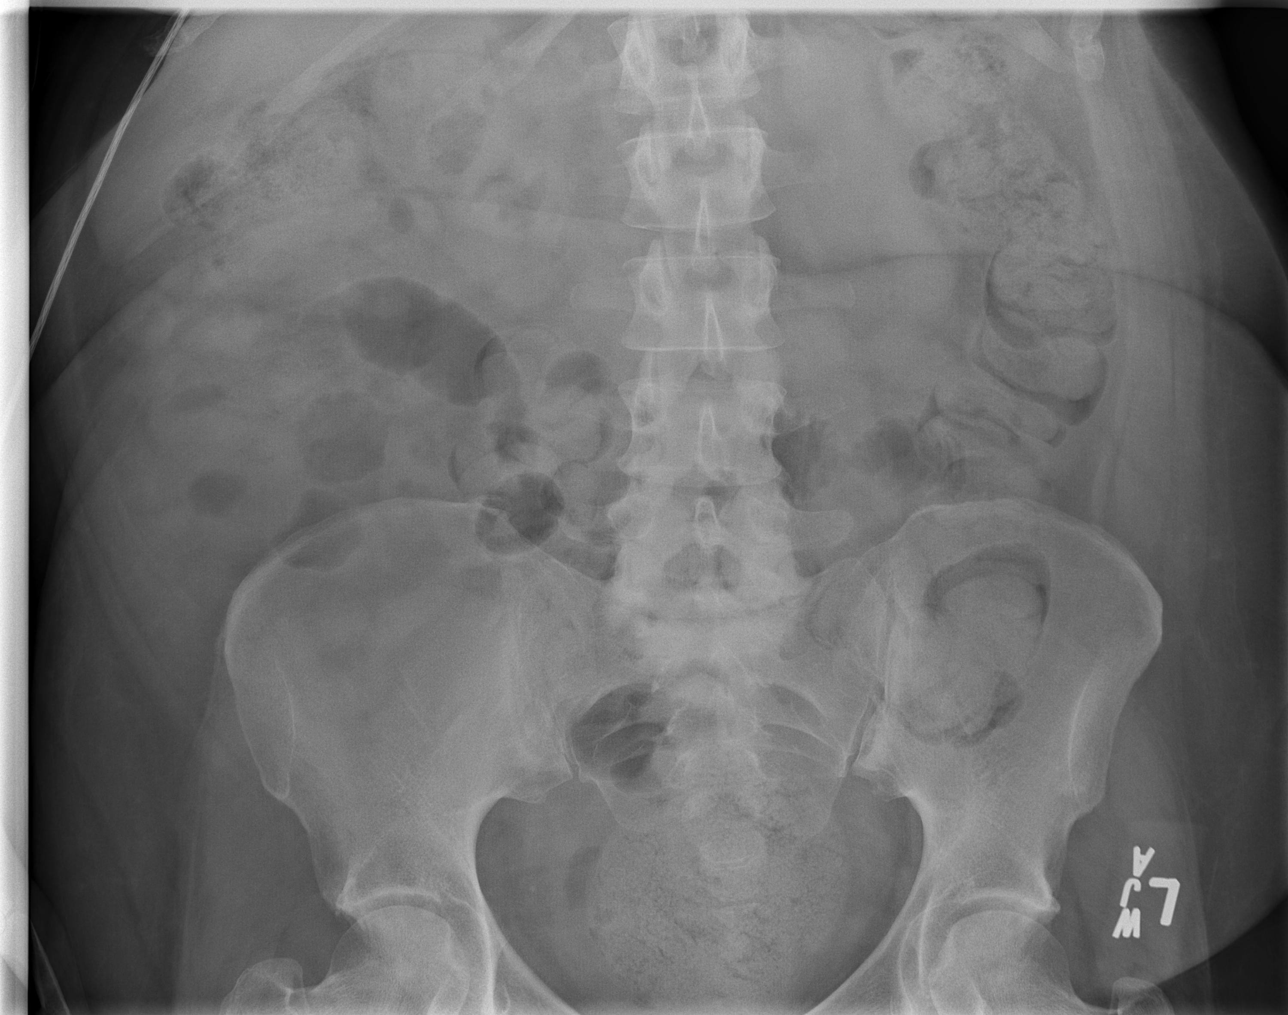

[t abdomen supine (2 of 2)]
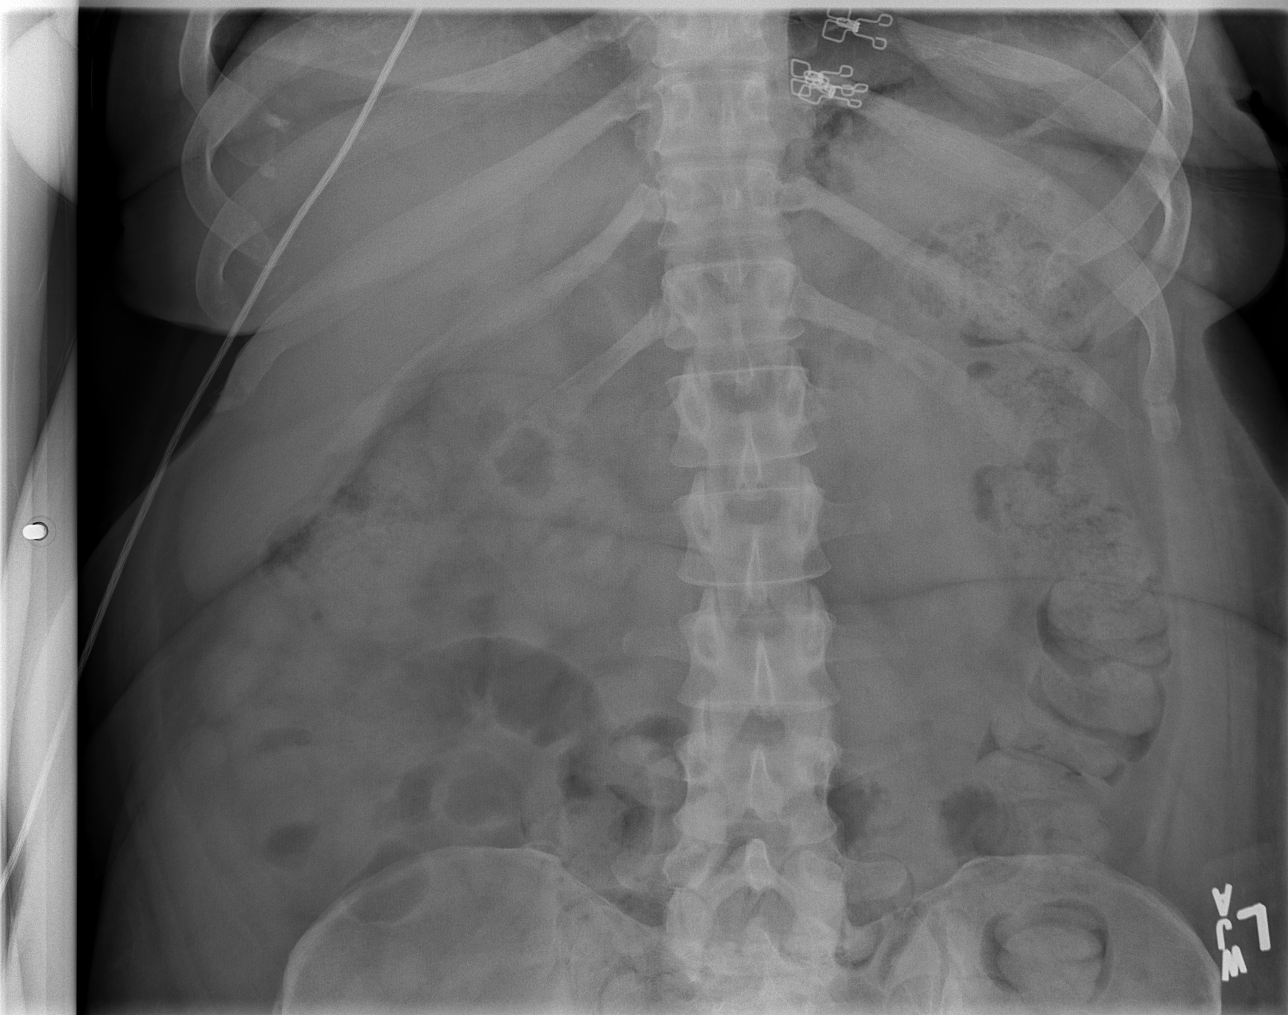

[4 of 4 positions shown; findings below may reference images not displayed]

FINDINGS: The lungs are well-aerated. Pulmonary vascularity is at the upper
limits of normal. There is no evidence of focal opacification,
pleural effusion or pneumothorax. The cardiomediastinal silhouette
is within normal limits.

The visualized bowel gas pattern is unremarkable. Scattered stool
and air are seen within the colon; there is no evidence of small
bowel dilatation to suggest obstruction. Moderate stool burden is
noted within the abdomen and pelvis; the rectum is distended to
cm with stool. No free intra-abdominal air is identified on the
provided decubitus view.

No acute osseous abnormalities are seen; the sacroiliac joints are
unremarkable in appearance.
IMPRESSION: 1. Moderate stool burden within the abdomen and pelvis; rectum
distended to 8.4 cm with stool. This could reflect mild fecal
impaction.
2. Unremarkable bowel gas pattern; no free intra-abdominal air seen.
3. No acute cardiopulmonary process identified.

## 2016-03-05 ENCOUNTER — Emergency Department (HOSPITAL_COMMUNITY)
Admission: EM | Admit: 2016-03-05 | Discharge: 2016-03-05 | Disposition: A | Discharge status: Home / Self Care | Payer: Medicaid Other | Attending: Emergency Medicine | Admitting: Emergency Medicine

## 2016-03-05 ENCOUNTER — Encounter (HOSPITAL_COMMUNITY): Payer: Self-pay

## 2016-03-05 DIAGNOSIS — I2510 Atherosclerotic heart disease of native coronary artery without angina pectoris: Secondary | ICD-10-CM | POA: Insufficient documentation

## 2016-03-05 DIAGNOSIS — Z794 Long term (current) use of insulin: Secondary | ICD-10-CM | POA: Insufficient documentation

## 2016-03-05 DIAGNOSIS — Z8673 Personal history of transient ischemic attack (TIA), and cerebral infarction without residual deficits: Secondary | ICD-10-CM | POA: Insufficient documentation

## 2016-03-05 DIAGNOSIS — I252 Old myocardial infarction: Secondary | ICD-10-CM | POA: Insufficient documentation

## 2016-03-05 DIAGNOSIS — I10 Essential (primary) hypertension: Secondary | ICD-10-CM | POA: Insufficient documentation

## 2016-03-05 DIAGNOSIS — Z79899 Other long term (current) drug therapy: Secondary | ICD-10-CM | POA: Insufficient documentation

## 2016-03-05 DIAGNOSIS — J45909 Unspecified asthma, uncomplicated: Secondary | ICD-10-CM | POA: Insufficient documentation

## 2016-03-05 DIAGNOSIS — G43A0 Cyclical vomiting, not intractable: Secondary | ICD-10-CM | POA: Insufficient documentation

## 2016-03-05 DIAGNOSIS — E1140 Type 2 diabetes mellitus with diabetic neuropathy, unspecified: Secondary | ICD-10-CM | POA: Insufficient documentation

## 2016-03-05 DIAGNOSIS — Z87891 Personal history of nicotine dependence: Secondary | ICD-10-CM | POA: Insufficient documentation

## 2016-03-05 DIAGNOSIS — R1115 Cyclical vomiting syndrome unrelated to migraine: Secondary | ICD-10-CM

## 2016-03-05 LAB — COMPREHENSIVE METABOLIC PANEL WITH GFR
ALT: 9 U/L — ABNORMAL LOW (ref 14–54)
AST: 19 U/L (ref 15–41)
Albumin: 3.9 g/dL (ref 3.5–5.0)
Alkaline Phosphatase: 55 U/L (ref 38–126)
Anion gap: 11 (ref 5–15)
BUN: 13 mg/dL (ref 6–20)
CO2: 23 mmol/L (ref 22–32)
Calcium: 9.5 mg/dL (ref 8.9–10.3)
Chloride: 102 mmol/L (ref 101–111)
Creatinine, Ser: 0.86 mg/dL (ref 0.44–1.00)
GFR calc Af Amer: >60 mL/min
GFR calc non Af Amer: >60 mL/min
Glucose, Bld: 200 mg/dL — ABNORMAL HIGH (ref 65–99)
Potassium: 4 mmol/L (ref 3.5–5.1)
Sodium: 136 mmol/L (ref 135–145)
Total Bilirubin: 0.5 mg/dL (ref 0.3–1.2)
Total Protein: 8.8 g/dL — ABNORMAL HIGH (ref 6.5–8.1)

## 2016-03-05 LAB — CBC
HEMATOCRIT: 31.1 % — AB (ref 36.0–46.0)
HEMOGLOBIN: 10.2 g/dL — AB (ref 12.0–15.0)
MCH: 20.7 pg — AB (ref 26.0–34.0)
MCHC: 32.8 g/dL (ref 30.0–36.0)
MCV: 63.2 fL — AB (ref 78.0–100.0)
Platelets: 518 10*3/uL — ABNORMAL HIGH (ref 150–400)
RBC: 4.92 MIL/uL (ref 3.87–5.11)
RDW: 21.9 % — ABNORMAL HIGH (ref 11.5–15.5)
WBC: 13.9 10*3/uL — ABNORMAL HIGH (ref 4.0–10.5)

## 2016-03-05 LAB — URINALYSIS, ROUTINE W REFLEX MICROSCOPIC
Bilirubin Urine: NEGATIVE
Glucose, UA: 500 mg/dL — AB
Ketones, ur: 15 mg/dL — AB
Leukocytes, UA: NEGATIVE
Nitrite: NEGATIVE
Protein, ur: 100 mg/dL — AB
Specific Gravity, Urine: 1.021 (ref 1.005–1.030)
pH: 6.5 (ref 5.0–8.0)

## 2016-03-05 LAB — RAPID URINE DRUG SCREEN, HOSP PERFORMED
Amphetamines: NOT DETECTED
Barbiturates: NOT DETECTED
Benzodiazepines: NOT DETECTED
Cocaine: POSITIVE — AB
Opiates: NOT DETECTED
Tetrahydrocannabinol: NOT DETECTED

## 2016-03-05 LAB — I-STAT BETA HCG BLOOD, ED (MC, WL, AP ONLY): I-stat hCG, quantitative: <5 m[IU]/mL

## 2016-03-05 LAB — URINE MICROSCOPIC-ADD ON

## 2016-03-05 LAB — LIPASE, BLOOD: Lipase: 27 U/L (ref 11–51)

## 2016-03-05 MED ORDER — TRAMADOL HCL 50 MG PO TABS: 50.0000 mg | ORAL_TABLET | Freq: Four times a day (QID) | ORAL | 0 refills | Status: DC | PRN

## 2016-03-05 MED ORDER — PROMETHAZINE HCL 25 MG PO TABS: 25.0000 mg | ORAL_TABLET | Freq: Four times a day (QID) | ORAL | 0 refills | Status: DC | PRN

## 2016-03-05 MED ORDER — ONDANSETRON HCL 4 MG/2ML IJ SOLN
4.0000 mg | Freq: Once | INTRAMUSCULAR | Status: AC
  Administered 2016-03-05: 4 mg via INTRAVENOUS
  Filled 2016-03-05: qty 2

## 2016-03-05 MED ORDER — SODIUM CHLORIDE 0.9 % IV BOLUS (SEPSIS)
2000.0000 mL | Freq: Once | INTRAVENOUS | Status: AC
  Administered 2016-03-05: 2000 mL via INTRAVENOUS

## 2016-03-05 MED ORDER — RANITIDINE HCL 150 MG PO CAPS: 150.0000 mg | ORAL_CAPSULE | Freq: Two times a day (BID) | ORAL | 0 refills | Status: DC

## 2016-03-05 MED ORDER — HYDROMORPHONE HCL 1 MG/ML IJ SOLN
0.5000 mg | Freq: Once | INTRAMUSCULAR | Status: AC
Start: 2016-03-05 — End: 2016-03-05
  Administered 2016-03-05: 0.5 mg via INTRAVENOUS
  Filled 2016-03-05: qty 1

## 2016-03-05 MED ORDER — HYDROMORPHONE HCL 1 MG/ML IJ SOLN
1.0000 mg | Freq: Once | INTRAMUSCULAR | Status: AC
  Administered 2016-03-05: 1 mg via INTRAVENOUS
  Filled 2016-03-05: qty 1

## 2016-03-05 MED ORDER — LORAZEPAM 2 MG/ML IJ SOLN
1.0000 mg | Freq: Once | INTRAMUSCULAR | Status: AC
  Administered 2016-03-05: 1 mg via INTRAVENOUS
  Filled 2016-03-05: qty 1

## 2016-03-05 NOTE — ED Notes (Signed)
Pt noted to be rocking and thrashing around in the bed. Pt not able to cooperate with request d/t pain

## 2016-03-05 NOTE — ED Triage Notes (Signed)
Per EMS, pt from home.  Pt here with n/v and abdominal pain.  Pt has gastroparesis.  Pt has IV 20g in LAC.  200 cc IVF.  8 mg zofran and 100 mcg fentanyl in route.  Vitals: 184/104, hr 124, resp 14,

## 2016-03-05 NOTE — ED Provider Notes (Signed)
[facility] DEPT Provider Note   CSN: [id number] Arrival date & time: 03/05/16  1227     History   Chief Complaint Chief Complaint  Patient presents with  . Abdominal Pain  . Emesis    HPI Shpresa Sulewski Radziewicz is a 40 y.o. female.  Patient complains of vomiting and lower abdominal discomfort. Patient has had gastroparesis before   The history is provided by the patient. No language interpreter was used.  Abdominal Pain   This is a new problem. The current episode started 12 to 24 hours ago. The problem occurs constantly. The problem has not changed since onset.The pain is associated with an unknown factor. The pain is located in the periumbilical region. The quality of the pain is aching. The pain is at a severity of 5/10. The pain is mild. Associated symptoms include vomiting. Pertinent negatives include diarrhea, frequency, hematuria and headaches.  Emesis   Associated symptoms include abdominal pain. Pertinent negatives include no cough, no diarrhea and no headaches.    Past Medical History:  Diagnosis Date  . Abscess of tunica vaginalis    10/09- Abundant S. aureus- sensitive to all abx  . Anxiety   . Blood dyscrasia   . CAD (coronary artery disease) 06/15/2006   s/p Subendocardial MI with PDA angioplasty(no stent) on 06/15/06 and relook  cath 06/19/06 showed patency of site. Cath 12/10- no restenosis or significant CAD progression  . CVA (cerebral vascular accident) ([place]) ~ 02/2014   denies residual on 04/22/2014  . CVA (cerebral vascular accident) [facility])    history of remote right cerebellar infarct noted on head CT at least since 10/2011  . Depression   . Diabetes mellitus type 2, uncontrolled, with complications ([place])   . Fibromyalgia   . Gastritis   . Gastroparesis    secondary to poorly controlled DM, last emptying study performed 01/2010  was normal but may be falsely positive as pt was on reglan  . GERD (gastroesophageal reflux disease)   . Hepatitis B, chronic  (HCC)    Hep BeAb+,Hep B cAb+ & Hep BsAg+ (9/06)  . History of pyelonephritis    H/o GrpB Pyelonephritis (9/06) and UTI- 07/11- E.Coli, 12/10- GBS  . Hyperlipidemia   . Hypertension   . Iron deficiency anemia   . Irregular menses    Small ovarian follicles seen on [id number])  . MI (myocardial infarction) 05/2006   PDA percutaneous transluminal coronary angioplasty  . Migraine    "weekly" (04/22/2014)  . N&V (nausea and vomiting)    Chronic. Unclear etiology with multiple admission and ED visits. CT abdomen with and without contrast (02/2011)  showed no acute process. Gastic Emptying scan (01/2010) was normal. Ultrasound of the abdomen was within normal limits. Hepatitis B viral load was undectable. HIV NR. EGD - gastritis, Hpylori + s/p Rx  . Obesity   . OSA (obstructive sleep apnea)    "suppose to wear mask but I don't" (04/22/2014)  . Peripheral neuropathy ([place])   . Pneumonia    "this is probably the 2nd or 3rd time I've had pneumonia" (04/22/2014)  . Recurrent boils   . Seasonal asthma   . Substance abuse   . Thrombocytosis ([place])    Hem/Onc suggested 2/2 chronic hepatits and/or iron deficiency anemia    Patient Active Problem List   Diagnosis Date Noted  . Non-intractable cyclical vomiting with nausea   . Dehydration 03/27/2015  . Intractable nausea and vomiting 11/11/2014  . Gastroparesis 11/11/2014  . Cough   .  Hematemesis 10/13/2014  . DKA (diabetic ketoacidoses) ([place]) 10/13/2014  . Increased anion gap metabolic acidosis 0000000  . Tachycardia 07/21/2014  . Abdominal pain, chronic, left lower quadrant   . Nausea with vomiting   . Diabetic gastroparesis associated with type 2 diabetes mellitus ([place]) 04/28/2014  . History of Helicobacter pylori infection 04/22/2014  . Vaginal discharge 02/18/2014  . Atypical chest pain 07/22/2013  . Unspecified constipation 07/21/2013  . Tinea corporis 07/21/2013  . Intractable vomiting 04/29/2013  . Dysmenorrhea 04/22/2013  .  Headache(784.0) 02/08/2012  . Health care maintenance 01/22/2012  . Chronic hepatitis B ([place]) 03/07/2011  . History of leukocytosis 04/06/2010  . THROMBOCYTOSIS 04/06/2010  . Polysubstance abuse 02/23/2010  . Iron deficiency anemia 11/22/2009  . PERIPHERAL NEUROPATHY 10/01/2009  . Hyperlipidemia 08/30/2009  . POLYNEUROPATHY, DIABETIC 08/30/2009  . Hidradenitis (recurrent boils) 07/07/2008  . Depression 12/27/2007  . Abdominal pain, left lower quadrant 11/21/2007  . FIBROMYALGIA 10/30/2007  . BACK PAIN 04/01/2007  . OBSTRUCTIVE SLEEP APNEA 01/17/2007  . ANXIETY DEPRESSION 06/27/2006  . Chronic ischemic heart disease 06/15/2006  . OBESITY, MORBID 05/15/2006  . MIGRAINE HEADACHE 05/15/2006  . Asthma 05/15/2006  . Essential hypertension 01/16/2006  . IRREGULAR MENSTRUATION 01/16/2006  . PEDAL EDEMA 01/16/2006  . Poorly controlled type 2 diabetes mellitus with peripheral neuropathy ([place]) 01/16/1989    Past Surgical History:  Procedure Laterality Date  . CESAREAN SECTION  1997  . CORONARY ANGIOPLASTY WITH STENT PLACEMENT  2008   "2 stents"  . ESOPHAGOGASTRODUODENOSCOPY N/A 04/23/2014   Procedure: ESOPHAGOGASTRODUODENOSCOPY (EGD);  Surgeon: Winfield Cunas., MD;  Location[facility] ENDOSCOPY;  Service: Endoscopy;  Laterality: N/A;    OB History    No data available       Home Medications    Prior to Admission medications   Medication Sig Start Date End Date Taking? Authorizing Provider  albuterol (PROVENTIL HFA;VENTOLIN HFA) 108 (90 BASE) MCG/ACT inhaler Inhale 2 puffs into the lungs every 4 (four) hours as needed for wheezing or shortness of breath. 04/26/14  Yes Alexa Angela Burke, MD  atorvastatin (LIPITOR) 80 MG tablet Take 1 tablet (80 mg total) by mouth daily at 6 PM. 02/07/16  Yes Ledell Noss, MD  insulin aspart (NOVOLOG) 100 UNIT/ML injection Inject 1-6 Units into the skin 3 (three) times daily before meals. 180-200- 1 unit, 201-250=3 units, 251-300= 5 units, 301-350= 6 units,  351-400= 9 units and call MD   Yes Historical Provider, MD  Insulin Glargine (LANTUS SOLOSTAR) 100 UNIT/ML Solostar Pen Inject 10 Units into the skin daily at 10 pm. 01/31/16  Yes Shela Leff, MD  losartan (COZAAR) 25 MG tablet Take 1 tablet (25 mg total) by mouth daily. 02/07/16  Yes Ledell Noss, MD  ferrous sulfate 325 (65 FE) MG tablet Take 1 tablet (325 mg total) by mouth daily with breakfast. Patient not taking: Reported on 03/05/2016 02/07/16   Ledell Noss, MD  metoCLOPramide (REGLAN) 10 MG tablet Take 1 tablet (10 mg total) by mouth 4 (four) times daily. Patient not taking: Reported on 03/05/2016 02/07/16   Ledell Noss, MD  promethazine (PHENERGAN) 25 MG tablet Take 1 tablet (25 mg total) by mouth every 6 (six) hours as needed for nausea or vomiting. 03/05/16   Milton Ferguson, MD  ranitidine (ZANTAC) 150 MG capsule Take 1 capsule (150 mg total) by mouth 2 (two) times daily. 03/05/16   Milton Ferguson, MD  traMADol (ULTRAM) 50 MG tablet Take 1 tablet (50 mg total) by mouth every 6 (six) hours  as needed. 03/05/16   Milton Ferguson, MD    Family History Family History  Problem Relation Age of Onset  . Diabetes Father     Social History Social History  Substance Use Topics  . Smoking status: Former Smoker    Types: Cigarettes    Quit date: 04/24/1996  . Smokeless tobacco: Never Used     Comment: quit smoking cigarettes age 83  . Alcohol use 0.0 oz/week     Comment: 04/22/2014 "might have a few drinks a month"     Allergies   Lisinopril and Morphine and related   Review of Systems Review of Systems  Constitutional: Negative for appetite change and fatigue.  HENT: Negative for congestion, ear discharge and sinus pressure.   Eyes: Negative for discharge.  Respiratory: Negative for cough.   Cardiovascular: Negative for chest pain.  Gastrointestinal: Positive for abdominal pain and vomiting. Negative for diarrhea.  Genitourinary: Negative for frequency and hematuria.    Musculoskeletal: Negative for back pain.  Skin: Negative for rash.  Neurological: Negative for seizures and headaches.  Psychiatric/Behavioral: Negative for hallucinations.     Physical Exam Updated Vital Signs BP 147/84   Pulse 92   Temp 98.3 F (36.8 C) (Oral)   Resp 14   LMP 02/11/2016   SpO2 98%   Physical Exam  Constitutional: She is oriented to person, place, and time. She appears well-developed.  HENT:  Head: Normocephalic.  Eyes: Conjunctivae and EOM are normal. No scleral icterus.  Neck: Neck supple. No thyromegaly present.  Cardiovascular: Normal rate and regular rhythm.  Exam reveals no gallop and no friction rub.   No murmur heard. Pulmonary/Chest: No stridor. She has no wheezes. She has no rales. She exhibits no tenderness.  Abdominal: She exhibits no distension. There is tenderness. There is no rebound.  Musculoskeletal: Normal range of motion. She exhibits no edema.  Lymphadenopathy:    She has no cervical adenopathy.  Neurological: She is oriented to person, place, and time. She exhibits normal muscle tone. Coordination normal.  Skin: No rash noted. No erythema.  Psychiatric: She has a normal mood and affect. Her behavior is normal.     ED Treatments / Results  Labs (all labs ordered are listed, but only abnormal results are displayed) Labs Reviewed  COMPREHENSIVE METABOLIC PANEL - Abnormal; Notable for the following:       Result Value   Glucose, Bld 200 (*)    Total Protein 8.8 (*)    ALT 9 (*)    All other components within normal limits  CBC - Abnormal; Notable for the following:    WBC 13.9 (*)    Hemoglobin 10.2 (*)    HCT 31.1 (*)    MCV 63.2 (*)    MCH 20.7 (*)    RDW 21.9 (*)    Platelets 518 (*)    All other components within normal limits  URINALYSIS, ROUTINE W REFLEX MICROSCOPIC (NOT AT [facility]) - Abnormal; Notable for the following:    APPearance CLOUDY (*)    Glucose, UA 500 (*)    Hgb urine dipstick LARGE (*)    Ketones, ur 15  (*)    Protein, ur 100 (*)    All other components within normal limits  RAPID URINE DRUG SCREEN, HOSP PERFORMED - Abnormal; Notable for the following:    Cocaine POSITIVE (*)    All other components within normal limits  URINE MICROSCOPIC-ADD ON - Abnormal; Notable for the following:    Squamous Epithelial /  LPF 0-5 (*)    Bacteria, UA MANY (*)    All other components within normal limits  LIPASE, BLOOD  I-STAT BETA HCG BLOOD, ED ([facility], [facility], [facility] ONLY)    EKG  EKG Interpretation None       Radiology No results found.  Procedures Procedures (including critical care time)  Medications Ordered in ED Medications  ondansetron (ZOFRAN) injection 4 mg (not administered)  HYDROmorphone (DILAUDID) injection 0.5 mg (not administered)  LORazepam (ATIVAN) injection 1 mg (1 mg Intravenous Given 03/05/16 1421)  HYDROmorphone (DILAUDID) injection 1 mg (1 mg Intravenous Given 03/05/16 1422)  sodium chloride 0.9 % bolus 2,000 mL (0 mLs Intravenous Stopped 03/05/16 1840)     Initial Impression / Assessment and Plan / ED Course  I have reviewed the triage vital signs and the nursing notes.  Pertinent labs & imaging results that were available during my care of the patient were reviewed by me and considered in my medical decision making (see chart for details).  Clinical Course     Patient with gastroparesis. Patient improved with treatment. Patient instructed to stop using cocaine  Final Clinical Impressions(s) / ED Diagnoses   Final diagnoses:  Non-intractable cyclical vomiting with nausea    New Prescriptions New Prescriptions   PROMETHAZINE (PHENERGAN) 25 MG TABLET    Take 1 tablet (25 mg total) by mouth every 6 (six) hours as needed for nausea or vomiting.   RANITIDINE (ZANTAC) 150 MG CAPSULE    Take 1 capsule (150 mg total) by mouth 2 (two) times daily.   TRAMADOL (ULTRAM) 50 MG TABLET    Take 1 tablet (50 mg total) by mouth every 6 (six) hours as needed.     Milton Ferguson,  MD 03/05/16 [phone]

## 2016-03-05 NOTE — Discharge Instructions (Signed)
Follow up with your md this week. °

## 2016-03-14 IMAGING — CR DG CHEST 2V
2 series · 2 of 2 positions shown · non-contrast
Comparison: 07/30/2013; 07/22/2013; 07/02/2012

CLINICAL DATA: Chest pain for 1 hr, shortness of breath, history of
CAD in diabetes

EXAM:
CHEST  2 VIEW

[w chest pa]
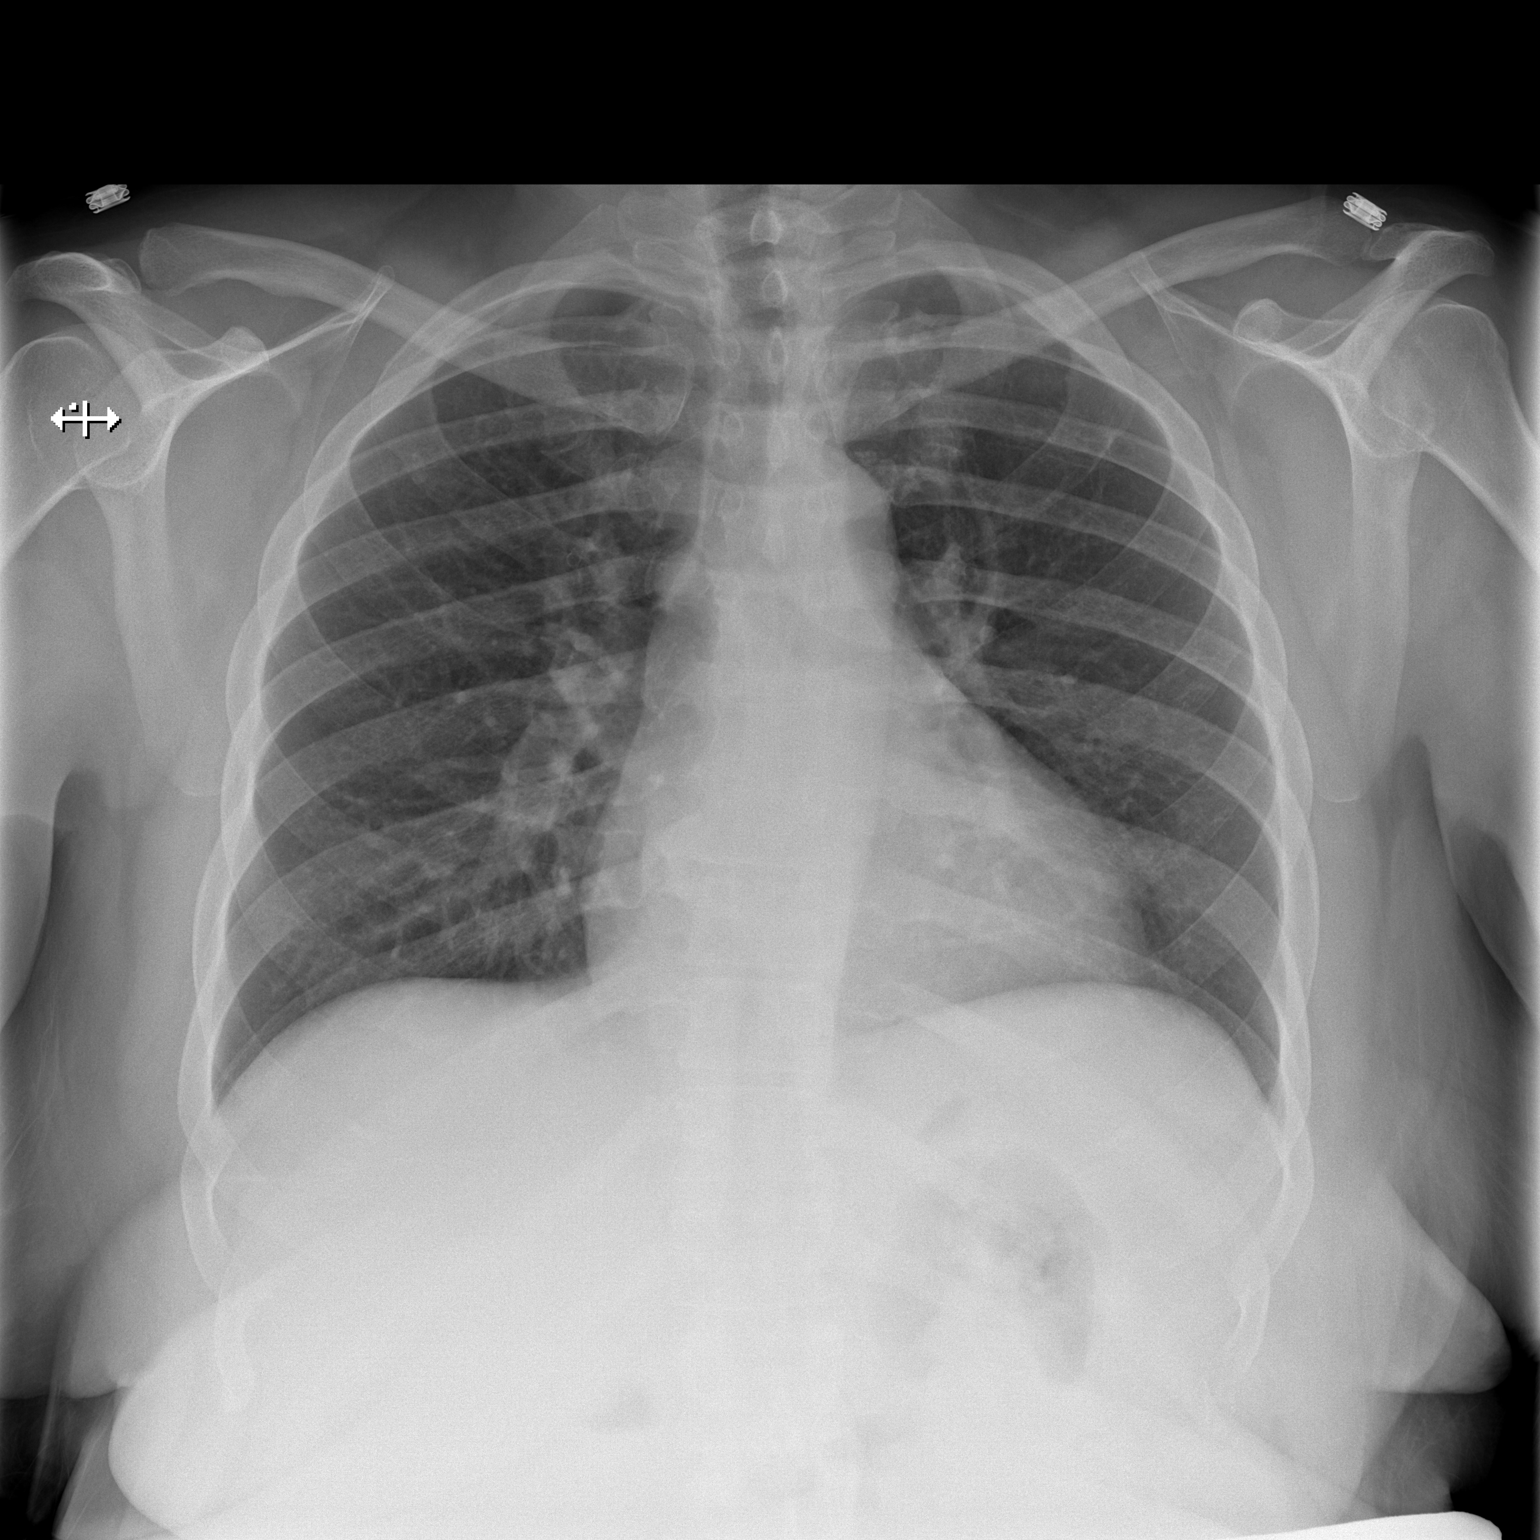

[w chest lat]
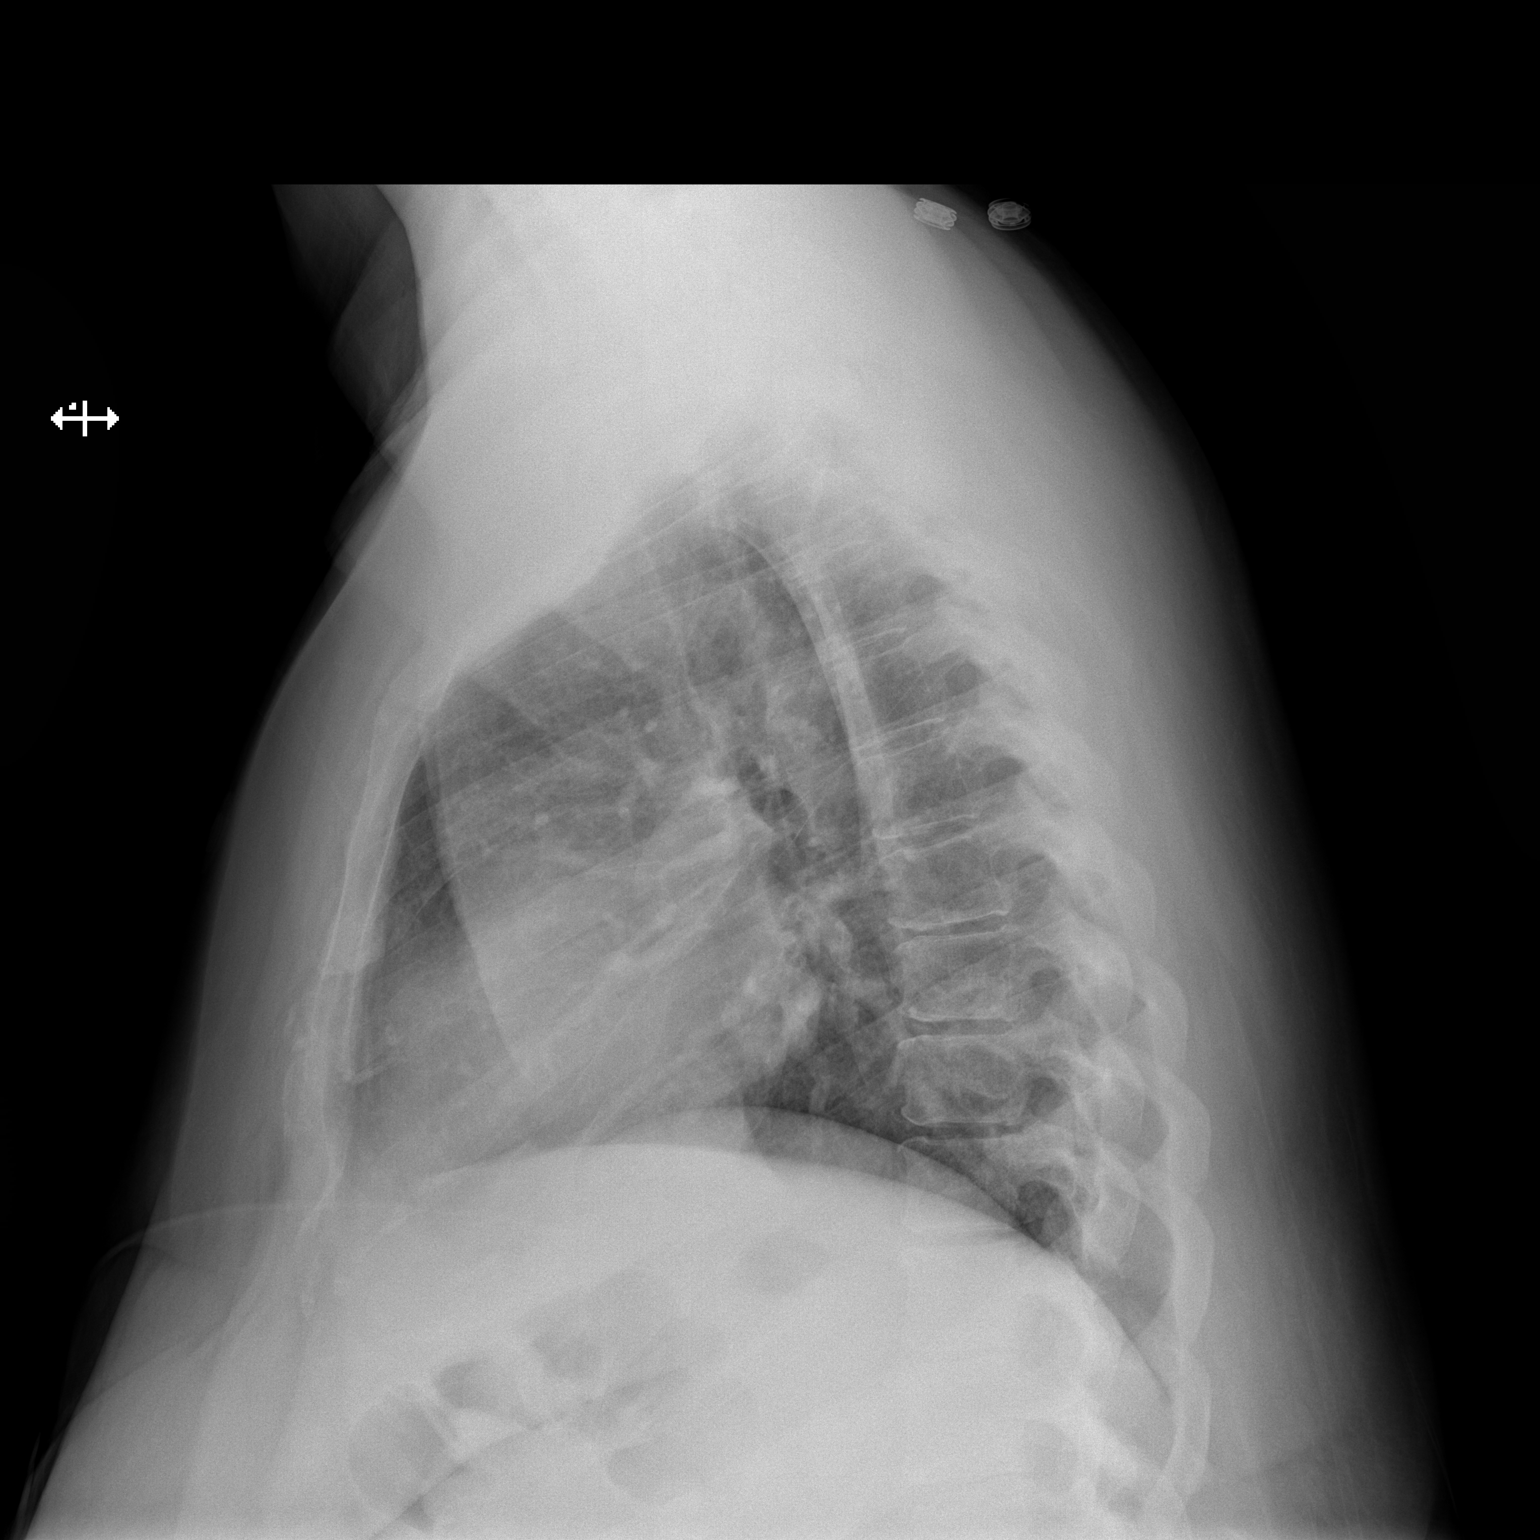

[2 of 2 positions shown; findings below may reference images not displayed]

FINDINGS: Grossly unchanged cardiac silhouette and mediastinal contours. No
focal parenchymal opacities. No pleural effusion or pneumothorax.
There is minimal pleural parenchymal thickening about the bilateral
major fissures. No evidence of edema. Grossly unchanged bones.
IMPRESSION: No acute cardiopulmonary disease.

## 2016-03-15 ENCOUNTER — Encounter (HOSPITAL_COMMUNITY): Payer: Self-pay | Admitting: Physical Medicine and Rehabilitation

## 2016-03-15 ENCOUNTER — Emergency Department (HOSPITAL_COMMUNITY)
Admission: EM | Admit: 2016-03-15 | Discharge: 2016-03-15 | Disposition: A | Discharge status: Home / Self Care | Payer: Medicaid Other | Attending: Physician Assistant | Admitting: Physician Assistant

## 2016-03-15 DIAGNOSIS — Z8673 Personal history of transient ischemic attack (TIA), and cerebral infarction without residual deficits: Secondary | ICD-10-CM | POA: Insufficient documentation

## 2016-03-15 DIAGNOSIS — I2510 Atherosclerotic heart disease of native coronary artery without angina pectoris: Secondary | ICD-10-CM | POA: Insufficient documentation

## 2016-03-15 DIAGNOSIS — I252 Old myocardial infarction: Secondary | ICD-10-CM | POA: Insufficient documentation

## 2016-03-15 DIAGNOSIS — K3184 Gastroparesis: Secondary | ICD-10-CM

## 2016-03-15 DIAGNOSIS — N3001 Acute cystitis with hematuria: Secondary | ICD-10-CM | POA: Insufficient documentation

## 2016-03-15 DIAGNOSIS — Z794 Long term (current) use of insulin: Secondary | ICD-10-CM | POA: Insufficient documentation

## 2016-03-15 DIAGNOSIS — J45909 Unspecified asthma, uncomplicated: Secondary | ICD-10-CM | POA: Insufficient documentation

## 2016-03-15 DIAGNOSIS — Z87891 Personal history of nicotine dependence: Secondary | ICD-10-CM | POA: Insufficient documentation

## 2016-03-15 DIAGNOSIS — I10 Essential (primary) hypertension: Secondary | ICD-10-CM | POA: Insufficient documentation

## 2016-03-15 DIAGNOSIS — E1140 Type 2 diabetes mellitus with diabetic neuropathy, unspecified: Secondary | ICD-10-CM | POA: Insufficient documentation

## 2016-03-15 DIAGNOSIS — Z955 Presence of coronary angioplasty implant and graft: Secondary | ICD-10-CM | POA: Insufficient documentation

## 2016-03-15 LAB — CBC WITH DIFFERENTIAL/PLATELET
BASOS ABS: 0 10*3/uL (ref 0.0–0.1)
Basophils Relative: 0 %
EOS ABS: 0 10*3/uL (ref 0.0–0.7)
Eosinophils Relative: 0 %
HEMATOCRIT: 33 % — AB (ref 36.0–46.0)
Hemoglobin: 10.7 g/dL — ABNORMAL LOW (ref 12.0–15.0)
LYMPHS ABS: 1.4 10*3/uL (ref 0.7–4.0)
LYMPHS PCT: 8 %
MCH: 20.7 pg — ABNORMAL LOW (ref 26.0–34.0)
MCHC: 32.4 g/dL (ref 30.0–36.0)
MCV: 64 fL — ABNORMAL LOW (ref 78.0–100.0)
MONOS PCT: 3 %
Monocytes Absolute: 0.5 10*3/uL (ref 0.1–1.0)
NEUTROS ABS: 15.5 10*3/uL — AB (ref 1.7–7.7)
Neutrophils Relative %: 89 %
Platelets: 606 10*3/uL — ABNORMAL HIGH (ref 150–400)
RBC: 5.16 MIL/uL — AB (ref 3.87–5.11)
RDW: 21.5 % — AB (ref 11.5–15.5)
WBC: 17.4 10*3/uL — AB (ref 4.0–10.5)

## 2016-03-15 LAB — COMPREHENSIVE METABOLIC PANEL
ALBUMIN: 3.8 g/dL (ref 3.5–5.0)
ALT: 11 U/L — ABNORMAL LOW (ref 14–54)
ANION GAP: 14 (ref 5–15)
AST: 24 U/L (ref 15–41)
Alkaline Phosphatase: 62 U/L (ref 38–126)
BUN: 12 mg/dL (ref 6–20)
CHLORIDE: 101 mmol/L (ref 101–111)
CO2: 21 mmol/L — AB (ref 22–32)
Calcium: 10 mg/dL (ref 8.9–10.3)
Creatinine, Ser: 1.05 mg/dL — ABNORMAL HIGH (ref 0.44–1.00)
GFR calc Af Amer: >60 mL/min (ref >60)
GFR calc non Af Amer: >60 mL/min (ref >60)
GLUCOSE: 236 mg/dL — AB (ref 65–99)
POTASSIUM: 3.9 mmol/L (ref 3.5–5.1)
SODIUM: 136 mmol/L (ref 135–145)
Total Bilirubin: 0.7 mg/dL (ref 0.3–1.2)
Total Protein: 8.7 g/dL — ABNORMAL HIGH (ref 6.5–8.1)

## 2016-03-15 LAB — URINE MICROSCOPIC-ADD ON

## 2016-03-15 LAB — I-STAT BETA HCG BLOOD, ED (MC, WL, AP ONLY): I-stat hCG, quantitative: <5 m[IU]/mL (ref <5)

## 2016-03-15 LAB — URINALYSIS, ROUTINE W REFLEX MICROSCOPIC
GLUCOSE, UA: 500 mg/dL — AB
KETONES UR: 15 mg/dL — AB
NITRITE: NEGATIVE
PH: 5.5 (ref 5.0–8.0)
Protein, ur: 100 mg/dL — AB
SPECIFIC GRAVITY, URINE: 1.028 (ref 1.005–1.030)

## 2016-03-15 LAB — LIPASE, BLOOD: Lipase: 27 U/L (ref 11–51)

## 2016-03-15 MED ORDER — METOCLOPRAMIDE HCL 5 MG/ML IJ SOLN
10.0000 mg | Freq: Once | INTRAMUSCULAR | Status: AC
  Administered 2016-03-15: 10 mg via INTRAVENOUS
  Filled 2016-03-15: qty 2

## 2016-03-15 MED ORDER — HALOPERIDOL LACTATE 5 MG/ML IJ SOLN
4.0000 mg | Freq: Once | INTRAMUSCULAR | Status: AC
Start: 2016-03-15 — End: 2016-03-15
  Administered 2016-03-15: 4 mg via INTRAVENOUS
  Filled 2016-03-15: qty 1

## 2016-03-15 MED ORDER — CEPHALEXIN 500 MG PO CAPS: 500.0000 mg | ORAL_CAPSULE | Freq: Two times a day (BID) | ORAL | 0 refills | Status: DC

## 2016-03-15 MED ORDER — METOCLOPRAMIDE HCL 10 MG PO TABS: 10.0000 mg | ORAL_TABLET | Freq: Four times a day (QID) | ORAL | 0 refills | Status: DC

## 2016-03-15 MED ORDER — PROMETHAZINE HCL 25 MG RE SUPP: 25.0000 mg | Freq: Four times a day (QID) | RECTAL | 0 refills | Status: DC | PRN

## 2016-03-15 MED ORDER — SODIUM CHLORIDE 0.9 % IV BOLUS (SEPSIS)
1000.0000 mL | Freq: Once | INTRAVENOUS | Status: AC
  Administered 2016-03-15: 1000 mL via INTRAVENOUS

## 2016-03-15 MED ORDER — METOCLOPRAMIDE HCL 10 MG PO TABS: 10.0000 mg | ORAL_TABLET | Freq: Four times a day (QID) | ORAL | 0 refills | Status: DC | PRN

## 2016-03-15 NOTE — ED Provider Notes (Signed)
[facility] DEPT Provider Note   CSN: [id number] Arrival date & time: 03/15/16  1135     History   Chief Complaint Chief Complaint  Patient presents with  . Abdominal Pain    HPI Shirley [name] is a 40 y.o. female.  HPI   40 year old female presents today with complaints of abdominal pain. Patient reports she has a significant past medical history of gastroparesis reports this is a gastroparesis flare. She notes symptoms started 2 days ago with lower abdominal pain with associated nausea and vomiting. She denies any diarrhea, denies any fever, reports this is nonfocal, she denies any vaginal discharge or bleeding. Patient reports she has not tried any medications at home as she does not have any medications. Patient reports she has seen a gastroenterologist, has been diagnosed gastroparesis, but is uncertain why this is happening. She does note a history of diabetes, notes that she is not taking any medications-she does not have insurance and cannot afford to pay for them. Patient denies any painful urination, reports she is been being more than normal.   Past Medical History:  Diagnosis Date  . Abscess of tunica vaginalis    10/09- Abundant S. aureus- sensitive to all abx  . Anxiety   . Blood dyscrasia   . CAD (coronary artery disease) 06/15/2006   s/p Subendocardial MI with PDA angioplasty(no stent) on 06/15/06 and relook  cath 06/19/06 showed patency of site. Cath 12/10- no restenosis or significant CAD progression  . CVA (cerebral vascular accident) ([place]) ~ 02/2014   denies residual on 04/22/2014  . [facility]cere[facility] vascular accident[facility])    history of remote right cerebellar infarct noted on head CT at least since 10/2011  . Depression   . Diabetes mellitus type 2, uncontrolled, with complications ([place])   . Fibromyalgia   . Gastritis   . Gastroparesis    secondary to poorly controlled DM, last emptying study performed 01/2010  was normal but may be falsely positive as  pt was on reglan  . GERD (gastroesophageal reflux disease)   . Hepatitis B, chronic (HCC)    Hep BeAb+,Hep B cAb+ & Hep BsAg+ (9/06)  . History of pyelonephritis    H/o GrpB Pyelonephritis (9/06) and UTI- 07/11- E.Coli, 12/10- GBS  . Hyperlipidemia   . Hypertension   . Iron deficiency anemia   . Irregular menses    Small ovarian follicles seen on 99991111)  . MI (myocardial infarction) 05/2006   PDA percutaneous transluminal coronary angioplasty  . Migraine    "weekly" (04/22/2014)  . N&V (nausea and vomiting)    Chronic. Unclear etiology with multiple admission and ED visits. CT abdomen with and without contrast (02/2011)  showed no acute process. Gastic Emptying scan (01/2010) was normal. Ultrasound of the abdomen was within normal limits. Hepatitis B viral load was undectable. HIV NR. EGD - gastritis, Hpylori + s/p Rx  . Obesity   . OSA (obstructive sleep apnea)    "suppose to wear mask but I don't" (04/22/2014)  . Peripheral neuropathy ([place])   . Pneumonia    "this is probably the 2nd or 3rd time I've had pneumonia" (04/22/2014)  . Recurrent boils   . Seasonal asthma   . Substance abuse   . Thrombocytosis ([place])    Hem/Onc suggested 2/2 chronic hepatits and/or iron deficiency anemia    Patient Active Problem List   Diagnosis Date Noted  . Non-intractable cyclical vomiting with nausea   . Dehydration 03/27/2015  . Intractable nausea and vomiting  11/11/2014  . Gastroparesis 11/11/2014  . Cough   . Hematemesis 10/13/2014  . DKA (diabetic ketoacidoses) ([place]) 10/13/2014  . Increased anion gap metabolic acidosis 0000000  . Tachycardia 07/21/2014  . Abdominal pain, chronic, left lower quadrant   . Nausea with vomiting   . Diabetic gastroparesis associated with type 2 diabetes mellitus ([place]) 04/28/2014  . History of Helicobacter pylori infection 04/22/2014  . Vaginal discharge 02/18/2014  . Atypical chest pain 07/22/2013  . Unspecified constipation 07/21/2013  . Tinea  corporis 07/21/2013  . Intractable vomiting 04/29/2013  . Dysmenorrhea 04/22/2013  . Headache(784.0) 02/08/2012  . Health care maintenance 01/22/2012  . Chronic hepatitis B ([place]) 03/07/2011  . History of leukocytosis 04/06/2010  . THROMBOCYTOSIS 04/06/2010  . Polysubstance abuse 02/23/2010  . Iron deficiency anemia 11/22/2009  . PERIPHERAL NEUROPATHY 10/01/2009  . Hyperlipidemia 08/30/2009  . POLYNEUROPATHY, DIABETIC 08/30/2009  . Hidradenitis (recurrent boils) 07/07/2008  . Depression 12/27/2007  . Abdominal pain, left lower quadrant 11/21/2007  . FIBROMYALGIA 10/30/2007  . BACK PAIN 04/01/2007  . OBSTRUCTIVE SLEEP APNEA 01/17/2007  . ANXIETY DEPRESSION 06/27/2006  . Chronic ischemic heart disease 06/15/2006  . OBESITY, MORBID 05/15/2006  . MIGRAINE HEADACHE 05/15/2006  . Asthma 05/15/2006  . Essential hypertension 01/16/2006  . IRREGULAR MENSTRUATION 01/16/2006  . PEDAL EDEMA 01/16/2006  . Poorly controlled type 2 diabetes mellitus with peripheral neuropathy ([place]) 01/16/1989    Past Surgical History:  Procedure Laterality Date  . CESAREAN SECTION  1997  . CORONARY ANGIOPLASTY WITH STENT PLACEMENT  2008   "2 stents"  . ESOPHAGOGASTRODUODENOSCOPY N/A 04/23/2014   Procedure: ESOPHAGOGASTRODUODENOSCOPY (EGD);  Surgeon: Winfield Cunas., MD;  Location: [facility] ENDOSCOPY;  Service: Endoscopy;  Laterality: N/A;    OB History    No data available       Home Medications    Prior to Admission medications   Medication Sig Start Date End Date Taking? Authorizing Provider  albuterol (PROVENTIL HFA;VENTOLIN HFA) 108 (90 BASE) MCG/ACT inhaler Inhale 2 puffs into the lungs every 4 (four) hours as needed for wheezing or shortness of breath. 04/26/14   Florinda Marker, MD  atorvastatin (LIPITOR) 80 MG tablet Take 1 tablet (80 mg total) by mouth daily at 6 PM. 02/07/16   Ledell Noss, MD  ferrous sulfate 325 (65 FE) MG tablet Take 1 tablet (325 mg total) by mouth daily with  breakfast. Patient not taking: Reported on 03/05/2016 02/07/16   Ledell Noss, MD  insulin aspart (NOVOLOG) 100 UNIT/ML injection Inject 1-6 Units into the skin 3 (three) times daily before meals. 180-200- 1 unit, 201-250=3 units, 251-300= 5 units, 301-350= 6 units, 351-400= 9 units and call MD    Historical Provider, MD  Insulin Glargine (LANTUS SOLOSTAR) 100 UNIT/ML Solostar Pen Inject 10 Units into the skin daily at 10 pm. 01/31/16   Shela Leff, MD  losartan (COZAAR) 25 MG tablet Take 1 tablet (25 mg total) by mouth daily. 02/07/16   Ledell Noss, MD  metoCLOPramide (REGLAN) 10 MG tablet Take 1 tablet (10 mg total) by mouth every 6 (six) hours. 03/15/16   Okey Regal, PA-C  metoCLOPramide (REGLAN) 10 MG tablet Take 1 tablet (10 mg total) by mouth every 6 (six) hours as needed for nausea. 03/15/16   Okey Regal, PA-C  promethazine (PHENERGAN) 25 MG suppository Place 1 suppository (25 mg total) rectally every 6 (six) hours as needed for nausea or vomiting. 03/15/16   Okey Regal, PA-C  ranitidine (ZANTAC) 150 MG capsule Take 1 capsule (150  mg total) by mouth 2 (two) times daily. 03/05/16   Milton Ferguson, MD  traMADol (ULTRAM) 50 MG tablet Take 1 tablet (50 mg total) by mouth every 6 (six) hours as needed. 03/05/16   Milton Ferguson, MD    Family History Family History  Problem Relation Age of Onset  . Diabetes Father     Social History Social History  Substance Use Topics  . Smoking status: Former Smoker    Types: Cigarettes    Quit date: 04/24/1996  . Smokeless tobacco: Never Used     Comment: quit smoking cigarettes age 53  . Alcohol use 0.0 oz/week     Comment: 04/22/2014 "might have a few drinks a month"     Allergies   Lisinopril and Morphine and related   Review of Systems Review of Systems  All other systems reviewed and are negative.    Physical Exam Updated Vital Signs BP 144/98   Pulse (!) 125   Temp 98.3 F (36.8 C) (Oral)   Resp 16   LMP 02/11/2016    SpO2 100%   Physical Exam  Constitutional: She is oriented to person, place, and time. She appears well-developed and well-nourished.  HENT:  Head: Normocephalic and atraumatic.  Eyes: Conjunctivae are normal. Pupils are equal, round, and reactive to light. Right eye exhibits no discharge. Left eye exhibits no discharge. No scleral icterus.  Neck: Normal range of motion. No JVD present. No tracheal deviation present.  Pulmonary/Chest: Effort normal. No stridor.  Abdominal:  Diffuse tenderness to even light palpation of the abdomen  Neurological: She is alert and oriented to person, place, and time. Coordination normal.  Psychiatric: She has a normal mood and affect. Her behavior is normal. Judgment and thought content normal.  Nursing note and vitals reviewed.    ED Treatments / Results  Labs (all labs ordered are listed, but only abnormal results are displayed) Labs Reviewed  CBC WITH DIFFERENTIAL/PLATELET - Abnormal; Notable for the following:       Result Value   WBC 17.4 (*)    RBC 5.16 (*)    Hemoglobin 10.7 (*)    HCT 33.0 (*)    MCV 64.0 (*)    MCH 20.7 (*)    RDW 21.5 (*)    Platelets 606 (*)    Neutro Abs 15.5 (*)    All other components within normal limits  COMPREHENSIVE METABOLIC PANEL - Abnormal; Notable for the following:    CO2 21 (*)    Glucose, Bld 236 (*)    Creatinine, Ser 1.05 (*)    Total Protein 8.7 (*)    ALT 11 (*)    All other components within normal limits  LIPASE, BLOOD  URINALYSIS, ROUTINE W REFLEX MICROSCOPIC (NOT AT [facility])  I-STAT BETA HCG BLOOD, ED ([facility], [facility], [facility] ONLY)    EKG  EKG Interpretation None       Radiology No results found.  Procedures Procedures (including critical care time)  Medications Ordered in ED Medications  sodium chloride 0.9 % bolus 1,000 mL (0 mLs Intravenous Stopped 03/15/16 1505)  metoCLOPramide (REGLAN) injection 10 mg (10 mg Intravenous Given 03/15/16 1303)  haloperidol lactate (HALDOL) injection 4 mg  (4 mg Intravenous Given 03/15/16 1303)     Initial Impression / Assessment and Plan / ED Course  I have reviewed the triage vital signs and the nursing notes.  Pertinent labs & imaging results that were available during my care of the patient were reviewed by me and considered  in my medical decision making (see chart for details).  Clinical Course       Final Clinical Impressions(s) / ED Diagnoses   Final diagnoses:  Gastroparesis   Labs:  Imaging:  Consults:  Therapeutics:  Discharge Meds:   Assessment/Plan:   40 year old female presents today with likely gastroparesis. Patient does not have any medication at home, appears to be resting comfortably in exam bed as I enter the room. After waking the patient she becomes severely anxious starts writhing in the bed complaining of abdominal discomfort. Even before my hand touches her abdomen she is acutely uncomfortable. Patient is afebrile upon my evaluation. Chart review shows numerous visits for the same, patient reports this appears identical to previous gastroparesis flares.  Patient has had Haldol in the past, recent EKG showed no QT prolongation. She'll be given Haldol, Reglan, normal saline.  Reassessment shows patient in no acute distress resting comfortably in exam bed. Abdomen significantly improved, still very minor tenderness to palpation. This is nonfocal. Again patient reassured that this was typical of her gastroparesis. Patient has had imaging in the past with similar episodes with no acute findings. Patient laboratory analysis similar to previous episodes as well. I see no need for further imaging or evaluation in the ED setting. Patient will be discharged home with both oral and suppository anti-medics, GI follow-up, and strict return precautions. She verbalizes understanding and agreement to today's plan had no further questions or concerns at time of discharge.    New Prescriptions New Prescriptions    METOCLOPRAMIDE (REGLAN) 10 MG TABLET    Take 1 tablet (10 mg total) by mouth every 6 (six) hours.   PROMETHAZINE (PHENERGAN) 25 MG SUPPOSITORY    Place 1 suppository (25 mg total) rectally every 6 (six) hours as needed for nausea or vomiting.     Okey Regal, PA-C 03/15/16 1646    Okey Regal, PA-C 03/15/16 [place], MD 03/23/16 [phone]

## 2016-03-15 NOTE — ED Notes (Signed)
Patient undressed, in gown, on monitor, continuous pulse oximetry and blood pressure cuff; warm blanket given 

## 2016-03-15 NOTE — ED Notes (Signed)
Pt assisted to lobby in wheelchair.

## 2016-03-15 NOTE — Discharge Instructions (Signed)
Please read attached information. If you experience any new or worsening signs or symptoms please return to the emergency room for evaluation. Please follow-up with your primary care provider or specialist as discussed. Please use medication prescribed only as directed and discontinue taking if you have any concerning signs or symptoms.   °

## 2016-03-15 NOTE — ED Triage Notes (Signed)
Pt reports lower abdominal pain and nausea/vomiting x2 days. Pt rocking back and forth in triage.

## 2016-03-17 ENCOUNTER — Inpatient Hospital Stay (HOSPITAL_COMMUNITY)
Admission: EM | Admit: 2016-03-17 | Discharge: 2016-03-20 | DRG: 074 | Disposition: A | Discharge status: Home / Self Care | Payer: Self-pay | Attending: Internal Medicine | Admitting: Internal Medicine

## 2016-03-17 ENCOUNTER — Encounter (HOSPITAL_COMMUNITY): Payer: Self-pay

## 2016-03-17 DIAGNOSIS — I252 Old myocardial infarction: Secondary | ICD-10-CM

## 2016-03-17 DIAGNOSIS — B181 Chronic viral hepatitis B without delta-agent: Secondary | ICD-10-CM | POA: Diagnosis present

## 2016-03-17 DIAGNOSIS — Z794 Long term (current) use of insulin: Secondary | ICD-10-CM

## 2016-03-17 DIAGNOSIS — E1142 Type 2 diabetes mellitus with diabetic polyneuropathy: Secondary | ICD-10-CM | POA: Diagnosis present

## 2016-03-17 DIAGNOSIS — Z87891 Personal history of nicotine dependence: Secondary | ICD-10-CM

## 2016-03-17 DIAGNOSIS — I160 Hypertensive urgency: Secondary | ICD-10-CM | POA: Diagnosis present

## 2016-03-17 DIAGNOSIS — K219 Gastro-esophageal reflux disease without esophagitis: Secondary | ICD-10-CM | POA: Diagnosis present

## 2016-03-17 DIAGNOSIS — Z9119 Patient's noncompliance with other medical treatment and regimen: Secondary | ICD-10-CM

## 2016-03-17 DIAGNOSIS — R112 Nausea with vomiting, unspecified: Secondary | ICD-10-CM | POA: Diagnosis present

## 2016-03-17 DIAGNOSIS — E785 Hyperlipidemia, unspecified: Secondary | ICD-10-CM | POA: Diagnosis present

## 2016-03-17 DIAGNOSIS — Z6831 Body mass index (BMI) 31.0-31.9, adult: Secondary | ICD-10-CM

## 2016-03-17 DIAGNOSIS — E119 Type 2 diabetes mellitus without complications: Secondary | ICD-10-CM | POA: Diagnosis present

## 2016-03-17 DIAGNOSIS — Z79899 Other long term (current) drug therapy: Secondary | ICD-10-CM

## 2016-03-17 DIAGNOSIS — R109 Unspecified abdominal pain: Secondary | ICD-10-CM

## 2016-03-17 DIAGNOSIS — G4733 Obstructive sleep apnea (adult) (pediatric): Secondary | ICD-10-CM | POA: Diagnosis present

## 2016-03-17 DIAGNOSIS — Z833 Family history of diabetes mellitus: Secondary | ICD-10-CM

## 2016-03-17 DIAGNOSIS — I10 Essential (primary) hypertension: Secondary | ICD-10-CM | POA: Diagnosis present

## 2016-03-17 DIAGNOSIS — I2510 Atherosclerotic heart disease of native coronary artery without angina pectoris: Secondary | ICD-10-CM | POA: Diagnosis present

## 2016-03-17 DIAGNOSIS — N390 Urinary tract infection, site not specified: Secondary | ICD-10-CM | POA: Diagnosis present

## 2016-03-17 DIAGNOSIS — F341 Dysthymic disorder: Secondary | ICD-10-CM | POA: Diagnosis present

## 2016-03-17 DIAGNOSIS — K3184 Gastroparesis: Secondary | ICD-10-CM | POA: Diagnosis present

## 2016-03-17 DIAGNOSIS — E1143 Type 2 diabetes mellitus with diabetic autonomic (poly)neuropathy: Principal | ICD-10-CM | POA: Diagnosis present

## 2016-03-17 DIAGNOSIS — M797 Fibromyalgia: Secondary | ICD-10-CM | POA: Diagnosis present

## 2016-03-17 DIAGNOSIS — Z8673 Personal history of transient ischemic attack (TIA), and cerebral infarction without residual deficits: Secondary | ICD-10-CM

## 2016-03-17 DIAGNOSIS — E1165 Type 2 diabetes mellitus with hyperglycemia: Secondary | ICD-10-CM | POA: Diagnosis present

## 2016-03-17 DIAGNOSIS — E669 Obesity, unspecified: Secondary | ICD-10-CM | POA: Diagnosis present

## 2016-03-17 DIAGNOSIS — Z885 Allergy status to narcotic agent status: Secondary | ICD-10-CM

## 2016-03-17 DIAGNOSIS — Z888 Allergy status to other drugs, medicaments and biological substances status: Secondary | ICD-10-CM

## 2016-03-17 DIAGNOSIS — F418 Other specified anxiety disorders: Secondary | ICD-10-CM | POA: Diagnosis present

## 2016-03-17 DIAGNOSIS — Z955 Presence of coronary angioplasty implant and graft: Secondary | ICD-10-CM

## 2016-03-17 LAB — I-STAT TROPONIN, ED: Troponin i, poc: 0.01 ng/mL (ref 0.00–0.08)

## 2016-03-17 LAB — COMPREHENSIVE METABOLIC PANEL
ALT: 12 U/L — ABNORMAL LOW (ref 14–54)
ANION GAP: 11 (ref 5–15)
AST: 21 U/L (ref 15–41)
Albumin: 3.5 g/dL (ref 3.5–5.0)
Alkaline Phosphatase: 48 U/L (ref 38–126)
BUN: 22 mg/dL — ABNORMAL HIGH (ref 6–20)
CALCIUM: 9.5 mg/dL (ref 8.9–10.3)
CHLORIDE: 103 mmol/L (ref 101–111)
CO2: 22 mmol/L (ref 22–32)
Creatinine, Ser: 1.26 mg/dL — ABNORMAL HIGH (ref 0.44–1.00)
GFR calc non Af Amer: 52 mL/min — ABNORMAL LOW (ref >60)
Glucose, Bld: 172 mg/dL — ABNORMAL HIGH (ref 65–99)
Potassium: 4 mmol/L (ref 3.5–5.1)
SODIUM: 136 mmol/L (ref 135–145)
Total Bilirubin: 0.3 mg/dL (ref 0.3–1.2)
Total Protein: 7.7 g/dL (ref 6.5–8.1)

## 2016-03-17 LAB — URINALYSIS, ROUTINE W REFLEX MICROSCOPIC
Bilirubin Urine: NEGATIVE
Glucose, UA: 100 mg/dL — AB
HGB URINE DIPSTICK: NEGATIVE
Ketones, ur: 15 mg/dL — AB
LEUKOCYTES UA: NEGATIVE
Nitrite: NEGATIVE
Protein, ur: NEGATIVE mg/dL
SPECIFIC GRAVITY, URINE: 1.007 (ref 1.005–1.030)
pH: 7 (ref 5.0–8.0)

## 2016-03-17 LAB — CBC
HCT: 31 % — ABNORMAL LOW (ref 36.0–46.0)
HEMOGLOBIN: 10 g/dL — AB (ref 12.0–15.0)
MCH: 20.5 pg — AB (ref 26.0–34.0)
MCHC: 32.3 g/dL (ref 30.0–36.0)
MCV: 63.5 fL — AB (ref 78.0–100.0)
Platelets: 478 10*3/uL — ABNORMAL HIGH (ref 150–400)
RBC: 4.88 MIL/uL (ref 3.87–5.11)
RDW: 21 % — ABNORMAL HIGH (ref 11.5–15.5)
WBC: 11.6 10*3/uL — ABNORMAL HIGH (ref 4.0–10.5)

## 2016-03-17 LAB — RAPID URINE DRUG SCREEN, HOSP PERFORMED
Amphetamines: NOT DETECTED
BARBITURATES: NOT DETECTED
BENZODIAZEPINES: NOT DETECTED
COCAINE: NOT DETECTED
OPIATES: NOT DETECTED
Tetrahydrocannabinol: NOT DETECTED

## 2016-03-17 LAB — URINE CULTURE: Culture: >=100000 — AB

## 2016-03-17 LAB — LIPASE, BLOOD: LIPASE: 29 U/L (ref 11–51)

## 2016-03-17 LAB — I-STAT BETA HCG BLOOD, ED (MC, WL, AP ONLY)

## 2016-03-17 LAB — GLUCOSE, CAPILLARY: Glucose-Capillary: 138 mg/dL — ABNORMAL HIGH (ref 65–99)

## 2016-03-17 MED ORDER — FAMOTIDINE IN NACL 20-0.9 MG/50ML-% IV SOLN
20.0000 mg | Freq: Once | INTRAVENOUS | Status: AC
  Administered 2016-03-17: 20 mg via INTRAVENOUS
  Filled 2016-03-17: qty 50

## 2016-03-17 MED ORDER — LACTATED RINGERS IV BOLUS (SEPSIS)
1000.0000 mL | Freq: Once | INTRAVENOUS | Status: AC
  Administered 2016-03-17: 1000 mL via INTRAVENOUS

## 2016-03-17 MED ORDER — SODIUM CHLORIDE 0.9 % IV SOLN
INTRAVENOUS | Status: DC
  Administered 2016-03-17: via INTRAVENOUS

## 2016-03-17 MED ORDER — ENOXAPARIN SODIUM 40 MG/0.4ML ~~LOC~~ SOLN
40.0000 mg | SUBCUTANEOUS | Status: DC
  Administered 2016-03-18 – 2016-03-20 (×3): 40 mg via SUBCUTANEOUS
  Filled 2016-03-17 (×4): qty 0.4

## 2016-03-17 MED ORDER — ALBUTEROL SULFATE (2.5 MG/3ML) 0.083% IN NEBU: 2.5000 mg | INHALATION_SOLUTION | RESPIRATORY_TRACT | Status: DC | PRN

## 2016-03-17 MED ORDER — DIPHENHYDRAMINE HCL 50 MG/ML IJ SOLN
25.0000 mg | Freq: Once | INTRAMUSCULAR | Status: AC
  Administered 2016-03-17: 25 mg via INTRAVENOUS
  Filled 2016-03-17: qty 1

## 2016-03-17 MED ORDER — INSULIN GLARGINE 100 UNIT/ML ~~LOC~~ SOLN
5.0000 [IU] | Freq: Every day | SUBCUTANEOUS | Status: DC
  Administered 2016-03-18: 5 [IU] via SUBCUTANEOUS
  Filled 2016-03-17 (×3): qty 0.05

## 2016-03-17 MED ORDER — METOCLOPRAMIDE HCL 5 MG/ML IJ SOLN
10.0000 mg | Freq: Four times a day (QID) | INTRAMUSCULAR | Status: DC
  Administered 2016-03-18 – 2016-03-20 (×10): 10 mg via INTRAVENOUS
  Filled 2016-03-17 (×10): qty 2

## 2016-03-17 MED ORDER — LOSARTAN POTASSIUM 50 MG PO TABS
25.0000 mg | ORAL_TABLET | Freq: Every day | ORAL | Status: DC
  Administered 2016-03-18 – 2016-03-19 (×2): 25 mg via ORAL
  Filled 2016-03-17 (×2): qty 1

## 2016-03-17 MED ORDER — DEXTROSE 5 % IV SOLN
1.0000 g | Freq: Once | INTRAVENOUS | Status: AC
  Administered 2016-03-17: 1 g via INTRAVENOUS
  Filled 2016-03-17: qty 10

## 2016-03-17 MED ORDER — LORAZEPAM 2 MG/ML IJ SOLN
1.0000 mg | Freq: Once | INTRAMUSCULAR | Status: AC
Start: 2016-03-17 — End: 2016-03-17
  Administered 2016-03-17: 1 mg via INTRAVENOUS
  Filled 2016-03-17: qty 1

## 2016-03-17 MED ORDER — LABETALOL HCL 5 MG/ML IV SOLN
10.0000 mg | Freq: Once | INTRAVENOUS | Status: AC
  Administered 2016-03-17: 10 mg via INTRAVENOUS
  Filled 2016-03-17: qty 4

## 2016-03-17 MED ORDER — TRAMADOL HCL 50 MG PO TABS
50.0000 mg | ORAL_TABLET | Freq: Four times a day (QID) | ORAL | Status: DC | PRN
  Administered 2016-03-18 – 2016-03-20 (×7): 50 mg via ORAL
  Filled 2016-03-17 (×7): qty 1

## 2016-03-17 MED ORDER — METOCLOPRAMIDE HCL 5 MG/ML IJ SOLN
10.0000 mg | Freq: Once | INTRAMUSCULAR | Status: AC
  Administered 2016-03-17: 10 mg via INTRAVENOUS
  Filled 2016-03-17: qty 2

## 2016-03-17 MED ORDER — PROCHLORPERAZINE EDISYLATE 5 MG/ML IJ SOLN
10.0000 mg | INTRAMUSCULAR | Status: DC | PRN
Start: 2016-03-17 — End: 2016-03-21
  Filled 2016-03-17: qty 2

## 2016-03-17 MED ORDER — PROMETHAZINE HCL 25 MG/ML IJ SOLN
25.0000 mg | Freq: Once | INTRAMUSCULAR | Status: AC
  Administered 2016-03-17: 25 mg via INTRAVENOUS
  Filled 2016-03-17: qty 1

## 2016-03-17 MED ORDER — PANTOPRAZOLE SODIUM 40 MG IV SOLR
40.0000 mg | Freq: Two times a day (BID) | INTRAVENOUS | Status: DC
  Administered 2016-03-18 (×2): 40 mg via INTRAVENOUS
  Filled 2016-03-17 (×2): qty 40

## 2016-03-17 MED ORDER — HYDRALAZINE HCL 20 MG/ML IJ SOLN
10.0000 mg | INTRAMUSCULAR | Status: DC | PRN
  Administered 2016-03-17: 10 mg via INTRAVENOUS
  Filled 2016-03-17: qty 1

## 2016-03-17 MED ORDER — METOCLOPRAMIDE HCL 5 MG/ML IJ SOLN: 10.0000 mg | Freq: Four times a day (QID) | INTRAMUSCULAR | Status: DC

## 2016-03-17 MED ORDER — ATORVASTATIN CALCIUM 80 MG PO TABS
80.0000 mg | ORAL_TABLET | Freq: Every day | ORAL | Status: DC
  Administered 2016-03-18 – 2016-03-20 (×3): 80 mg via ORAL
  Filled 2016-03-17 (×4): qty 1

## 2016-03-17 MED ORDER — INSULIN ASPART 100 UNIT/ML ~~LOC~~ SOLN
0.0000 [IU] | Freq: Three times a day (TID) | SUBCUTANEOUS | Status: DC
  Administered 2016-03-18: 2 [IU] via SUBCUTANEOUS
  Administered 2016-03-18 – 2016-03-19 (×3): 3 [IU] via SUBCUTANEOUS
  Administered 2016-03-19: 2 [IU] via SUBCUTANEOUS
  Administered 2016-03-20: 3 [IU] via SUBCUTANEOUS
  Administered 2016-03-20: 5 [IU] via SUBCUTANEOUS
  Administered 2016-03-20: 3 [IU] via SUBCUTANEOUS

## 2016-03-17 MED ORDER — ACETAMINOPHEN 650 MG RE SUPP: 650.0000 mg | Freq: Four times a day (QID) | RECTAL | Status: DC | PRN

## 2016-03-17 MED ORDER — ACETAMINOPHEN 325 MG PO TABS
650.0000 mg | ORAL_TABLET | Freq: Four times a day (QID) | ORAL | Status: DC | PRN
  Administered 2016-03-18: 650 mg via ORAL
  Filled 2016-03-17 (×2): qty 2

## 2016-03-17 MED ORDER — ALBUTEROL SULFATE HFA 108 (90 BASE) MCG/ACT IN AERS: 2.0000 | INHALATION_SPRAY | RESPIRATORY_TRACT | Status: DC | PRN

## 2016-03-17 MED ORDER — HALOPERIDOL LACTATE 5 MG/ML IJ SOLN
2.0000 mg | Freq: Once | INTRAMUSCULAR | Status: AC
  Administered 2016-03-17: 2 mg via INTRAVENOUS
  Filled 2016-03-17: qty 1

## 2016-03-17 NOTE — ED Notes (Signed)
Pt ambulated to bathroom 

## 2016-03-17 NOTE — ED Triage Notes (Addendum)
[facility]- pt here for abd pain with n/v. Pt appears uncomfortable on arrival. Hypertensive with EMS. PIV in place.   Pt states abd pain X4 days. She reports she was told 4 days ago she has gastroparesis and  UTI.

## 2016-03-17 NOTE — H&P (Signed)
History and Physical    Shirley [name] [id number] DOB: 24-Sep-1975 DOA: 03/17/2016  Referring MD/NP/PA: Dr. Ayesha Rumpf PCP: No PCP Per Patient  Patient coming from: Home  Chief Complaint: Nausea and vomiting  HPI: Shirley [name] is a 40 y.o. female with medical history significant of HTN, HLD, CAD, DM type 2, gastroparesis; who presents with intractable nausea and vomiting for the last 4 days. Patient reports emesis is nonbloody. Associated symptoms include left lower quadrant abdominal pain that's sharp and constant. Denies any diarrhea, dysuria, shortness of breath, or fever. Patient did not try anything to relieve symptoms. Patient noticed that symptoms are similar to previous episodes in the past. Patient was seen in the hospital on 11/22, and was diagnosed with a urinary tract infection. She was prescribed antibiotics, but patient never picked up this prescription. Patient notes that she has not had insurance and therefore has not been able to afford any of her medications.   ED Course: Upon admission to the emergency department the patient was noted to be afebrile with blood pressure as high as 202/123, pulse up to 140, respirations up to 22, and O2 saturations maintained. Lab work revealed WBC 11.6 (down from 17.4 just 2 days previous without taking antibiotics), BUN 22, creatinine 1.26, UDS negative, UA appears negative for infection. EKG showing sinus tachycardia.  Review of Systems: As per HPI otherwise 10 point review of systems negative.   Past Medical History:  Diagnosis Date  . Abscess of tunica vaginalis    10/09- Abundant S. aureus- sensitive to all abx  . Anxiety   . Blood dyscrasia   . CAD (coronary artery disease) 06/15/2006   s/p Subendocardial MI with PDA angioplasty(no stent) on 06/15/06 and relook  cath 06/19/06 showed patency of site. Cath 12/10- no restenosis or significant CAD progression  . CVA (cerebral vascular accident) ([place]) ~ 02/2014   denies residual on  04/22/2014  . CVA (cerebral vascular accident) [facility])    history of remote right cerebellar infarct noted on head CT at least since 10/2011  . Depression   . Diabetes mellitus type 2, uncontrolled, with complications ([place])   . Fibromyalgia   . Gastritis   . Gastroparesis    secondary to poorly controlled DM, last emptying study performed 01/2010  was normal but may be falsely positive as pt was on reglan  . GERD (gastroesophageal reflux disease)   . Hepatitis B, chronic (HCC)    Hep BeAb+,Hep B cAb+ & Hep BsAg+ (9/06)  . History of pyelonephritis    H/o GrpB Pyelonephritis (9/06) and UTI- 07/11- E.Coli, 12/10- GBS  . Hyperlipidemia   . Hypertension   . Iron deficiency anemia   . Irregular menses    Small ovarian follicles seen on [id number])  . MI (myocardial infarction) 05/2006   PDA percutaneous transluminal coronary angioplasty  . Migraine    "weekly" (04/22/2014)  . N&V (nausea and vomiting)    Chronic. Unclear etiology with multiple admission and ED visits. CT abdomen with and without contrast (02/2011)  showed no acute process. Gastic Emptying scan (01/2010) was normal. Ultrasound of the abdomen was within normal limits. Hepatitis B viral load was undectable. HIV NR. EGD - gastritis, Hpylori + s/p Rx  . Obesity   . OSA (obstructive sleep apnea)    "suppose to wear mask but I don't" (04/22/2014)  . Peripheral neuropathy ([place])   . Pneumonia    "this is probably the 2nd or 3rd time I've had pneumonia" (04/22/2014)  . Recurrent  boils   . Seasonal asthma   . Substance abuse   . Thrombocytosis ([place])    Hem/Onc suggested 2/2 chronic hepatits and/or iron deficiency anemia    Past Surgical History:  Procedure Laterality Date  . CESAREAN SECTION  1997  . CORONARY ANGIOPLASTY WITH STENT PLACEMENT  2008   "2 stents"  . ESOPHAGOGASTRODUODENOSCOPY N/A 04/23/2014   Procedure: ESOPHAGOGASTRODUODENOSCOPY (EGD);  Surgeon: Winfield Cunas., MD;  Location: [facility] ENDOSCOPY;  Service:  Endoscopy;  Laterality: N/A;     reports that she quit smoking about 19 years ago. Her smoking use included Cigarettes. She has never used smokeless tobacco. She reports that she drinks alcohol. She reports that she does not use drugs.  Allergies  Allergen Reactions  . Lisinopril Nausea Only and Swelling  . Morphine And Related Itching and Swelling    Family History  Problem Relation Age of Onset  . Diabetes Father     Prior to Admission medications   Medication Sig Start Date End Date Taking? Authorizing Provider  albuterol (PROVENTIL HFA;VENTOLIN HFA) 108 (90 BASE) MCG/ACT inhaler Inhale 2 puffs into the lungs every 4 (four) hours as needed for wheezing or shortness of breath. 04/26/14   Florinda Marker, MD  atorvastatin (LIPITOR) 80 MG tablet Take 1 tablet (80 mg total) by mouth daily at 6 PM. 02/07/16   Ledell Noss, MD  cephALEXin (KEFLEX) 500 MG capsule Take 1 capsule (500 mg total) by mouth 2 (two) times daily. 03/15/16   Okey Regal, PA-C  ferrous sulfate 325 (65 FE) MG tablet Take 1 tablet (325 mg total) by mouth daily with breakfast. Patient not taking: Reported on 03/05/2016 02/07/16   Ledell Noss, MD  insulin aspart (NOVOLOG) 100 UNIT/ML injection Inject 1-6 Units into the skin 3 (three) times daily before meals. 180-200- 1 unit, 201-250=3 units, 251-300= 5 units, 301-350= 6 units, 351-400= 9 units and call MD    Historical Provider, MD  Insulin Glargine (LANTUS SOLOSTAR) 100 UNIT/ML Solostar Pen Inject 10 Units into the skin daily at 10 pm. 01/31/16   Shela Leff, MD  losartan (COZAAR) 25 MG tablet Take 1 tablet (25 mg total) by mouth daily. 02/07/16   Ledell Noss, MD  metoCLOPramide (REGLAN) 10 MG tablet Take 1 tablet (10 mg total) by mouth every 6 (six) hours. 03/15/16   Okey Regal, PA-C  metoCLOPramide (REGLAN) 10 MG tablet Take 1 tablet (10 mg total) by mouth every 6 (six) hours as needed for nausea. 03/15/16   Okey Regal, PA-C  promethazine (PHENERGAN) 25 MG  suppository Place 1 suppository (25 mg total) rectally every 6 (six) hours as needed for nausea or vomiting. 03/15/16   Okey Regal, PA-C  ranitidine (ZANTAC) 150 MG capsule Take 1 capsule (150 mg total) by mouth 2 (two) times daily. 03/05/16   Milton Ferguson, MD  traMADol (ULTRAM) 50 MG tablet Take 1 tablet (50 mg total) by mouth every 6 (six) hours as needed. 03/05/16   Milton Ferguson, MD    Physical Exam:  Constitutional: Female who appears older than said age in mild discomfort. Vitals:   03/17/16 1945 03/17/16 2000 03/17/16 2027 03/17/16 2030  BP: (!) 202/123 (!) 158/137 (!) 190/110 183/93  Pulse: (!) 130 (!) 126  115  Resp:      Temp:      TempSrc:      SpO2: 100% 100%  100%   Eyes: PERRL, lids and conjunctivae normal ENMT: Mucous membranes are dry. Posterior pharynx clear of any exudate or  lesions. Poor dentition.  Neck: normal, supple, no masses, no thyromegaly Respiratory: clear to auscultation bilaterally, no wheezing, no crackles. Normal respiratory effort. No accessory muscle use.  Cardiovascular: Tachycardic , no murmurs / rubs / gallops. No extremity edema. 2+ pedal pulses. No carotid bruits.  Abdomen: no tenderness, no masses palpated. No hepatosplenomegaly. Bowel sounds positive.  Musculoskeletal: no clubbing / cyanosis. No joint deformity upper and lower extremities. Good ROM, no contractures. Normal muscle tone.  Skin: no rashes, lesions, ulcers. No induration Neurologic: CN 2-12 grossly intact. Sensation intact, DTR normal. Strength 5/5 in all 4.  Psychiatric:Poor judgment and insight. Alert and oriented x 3. Normal mood.     Labs on Admission: I have personally reviewed following labs and imaging studies  CBC:  Recent Labs Lab 03/15/16 1148 03/17/16 1223  WBC 17.4* 11.6*  NEUTROABS 15.5*  --   HGB 10.7* 10.0*  HCT 33.0* 31.0*  MCV 64.0* 63.5*  PLT 606* 123456*   Basic Metabolic Panel:  Recent Labs Lab 03/15/16 1148 03/17/16 1223  NA 136 136  K 3.9  4.0  CL 101 103  CO2 21* 22  GLUCOSE 236* 172*  BUN 12 22*  CREATININE 1.05* 1.26*  CALCIUM 10.0 9.5   GFR: CrCl cannot be calculated (Unknown ideal weight.). Liver Function Tests:  Recent Labs Lab 03/15/16 1148 03/17/16 1223  AST 24 21  ALT 11* 12*  ALKPHOS 62 48  BILITOT 0.7 0.3  PROT 8.7* 7.7  ALBUMIN 3.8 3.5    Recent Labs Lab 03/15/16 1148 03/17/16 1223  LIPASE 27 29   No results for input(s): AMMONIA in the last 168 hours. Coagulation Profile: No results for input(s): INR, PROTIME in the last 168 hours. Cardiac Enzymes: No results for input(s): CKTOTAL, CKMB, CKMBINDEX, TROPONINI in the last 168 hours. BNP (last 3 results) No results for input(s): PROBNP in the last 8760 hours. HbA1C: No results for input(s): HGBA1C in the last 72 hours. CBG: No results for input(s): GLUCAP in the last 168 hours. Lipid Profile: No results for input(s): CHOL, HDL, LDLCALC, TRIG, CHOLHDL, LDLDIRECT in the last 72 hours. Thyroid Function Tests: No results for input(s): TSH, T4TOTAL, FREET4, T3FREE, THYROIDAB in the last 72 hours. Anemia Panel: No results for input(s): VITAMINB12, FOLATE, FERRITIN, TIBC, IRON, RETICCTPCT in the last 72 hours. Urine analysis:    Component Value Date/Time   COLORURINE YELLOW 03/17/2016 1848   APPEARANCEUR CLEAR 03/17/2016 1848   LABSPEC 1.007 03/17/2016 1848   PHURINE 7.0 03/17/2016 1848   GLUCOSEU 100 (A) 03/17/2016 1848   GLUCOSEU > 1000 mg/dL (A) 07/07/2008 2115   HGBUR NEGATIVE 03/17/2016 1848   HGBUR negative 08/12/2008 1444   BILIRUBINUR NEGATIVE 03/17/2016 1848   BILIRUBINUR Negative 02/18/2014 1134   KETONESUR 15 (A) 03/17/2016 1848   PROTEINUR NEGATIVE 03/17/2016 1848   UROBILINOGEN 0.2 11/11/2014 1658   NITRITE NEGATIVE 03/17/2016 1848   LEUKOCYTESUR NEGATIVE 03/17/2016 1848   Sepsis Labs: Recent Results (from the past 240 hour(s))  Urine culture     Status: Abnormal   Collection Time: 03/15/16  3:55 PM  Result Value Ref  Range Status   Specimen Description URINE, CLEAN CATCH  Final   Special Requests NONE  Final   Culture (A)  Final    >=100,000 COLONIES/mL GROUP B STREP(S.AGALACTIAE)ISOLATED TESTING AGAINST S. AGALACTIAE NOT ROUTINELY PERFORMED DUE TO PREDICTABILITY OF AMP/PEN/VAN SUSCEPTIBILITY.    Report Status 03/17/2016 FINAL  Final     Radiological Exams on Admission: No results found.  EKG: Independently  reviewed. Sinus tachycardia  Assessment/Plan Intractable nausea and vomiting secondary to gastroparesis: Acute. Patient with nausea and vomiting unresolved even after being treated with Zofran, Phenergan, Haldol, Ativan, Reglan, and 2 L of lactated Ringer's. - Admit to a - NPO, but will need to advance diet as tolerated - IV fluids NS at 100 ml/hr - Zofran/ Compazine IV prn N/V or refractory vomiting - Scheduled IV Reglan - Social work consult for inability to obtain medications due to lack of insurance  Hypertensive urgency: Blood pressure elevated as high as 202/123 on admission. Patient given labetalol IV 10 mg all in the emergency department. - Restart Cozaar once tolerating oral medications - Hydralazine IV Prn  Diabetes mellitus type 2, uncontrolled: Last hemoglobin A1c 7.7 on 10/9. Patient reports being noncompliant with all medications. - Hypoglycemic protocols - Adjusted home Lantus down from 10 units to 5 units  - CBGs every before meals with sensitive sliding scale insulin  Anemia: Hemoglobin just a few days ago was noted to be 10.7, but on admission 10. continue to monitor recheck CBC in a.m. -  check CBC in a.m.   Cocaine abuse: Patient's last UDS on 11/12 positive for cocaine, but negative on UDS today. - Will need to continue to counsel anemia cession of cocaine abuse  Anxiety/depression - Continue to monitor  Jerrye Bushy - IV  protonix for   DVT prophylaxis: Lovenox Code Status: Full Family Communication: No family present at bedside  Disposition Plan:  likely discharge  home once nausea and vomiting resolving and tolerating oral intake  Consults called:  None Admission status: observation  Norval Morton MD [facility] Pager [phone]  If 7PM-7AM, please contact night-coverage [email or web address] Password [email or web address]  03/17/2016, 8:59 PM

## 2016-03-17 NOTE — ED Provider Notes (Signed)
[facility] DEPT Provider Note   CSN: [id number] Arrival date & time: 03/17/16  1215     History   Chief Complaint Chief Complaint  Patient presents with  . Abdominal Pain    HPI Shirley [name] is a 40 y.o. female.  The history is provided by the patient. No language interpreter was used.  Abdominal Pain     Shirley [name] is a 40 y.o. female who presents to the Emergency Department complaining of vomiting, abdominal pain.  She has a hx/o DM, gastroparesis, HTN.  She has experienced four days of LLQ abdominal pain described as sharp and constant in nature, nonradiating.  She has associated nausea and vomiting, multiple episodes.  No diarrhea, dysuria.  Last BM two days ago, able to pass flatus.  No fevers, chest pain, sob.  Sxs are similar to prior gastroparesis flares.  She was seen in the ED recently for similar sxs and dx with UTI but was unable to fill her medications.  She is currently on no home medications.  She denies tobacco, alcohol, or drug use.   Past Medical History:  Diagnosis Date  . Abscess of tunica vaginalis    10/09- Abundant S. aureus- sensitive to all abx  . Anxiety   . Blood dyscrasia   . CAD (coronary artery disease) 06/15/2006   s/p Subendocardial MI with PDA angioplasty(no stent) on 06/15/06 and relook  cath 06/19/06 showed patency of site. Cath 12/10- no restenosis or significant CAD progression  . CVA (cerebral vascular accident) ([place]) ~ 02/2014   denies residual on 04/22/2014  . CVA (cerebral vascular accident) [facility])    history of remote right cerebellar infarct noted on head CT at least since 10/2011  . Depression   . Diabetes mellitus type 2, uncontrolled, with complications ([place])   . Fibromyalgia   . Gastritis   . Gastroparesis    secondary to poorly controlled DM, last emptying study performed 01/2010  was normal but may be falsely positive as pt was on reglan  . GERD (gastroesophageal reflux disease)   . Hepatitis B, chronic (HCC)    Hep BeAb+,Hep B cAb+ & Hep BsAg+ (9/06)  . History of pyelonephritis    H/o GrpB Pyelonephritis (9/06) and UTI- 07/11- E.Coli, 12/10- GBS  . Hyperlipidemia   . Hypertension   . Iron deficiency anemia   . Irregular menses    Small ovarian follicles seen on 99991111)  . MI (myocardial infarction) 05/2006   PDA percutaneous transluminal coronary angioplasty  . Migraine    "weekly" (04/22/2014)  . N&V (nausea and vomiting)    Chronic. Unclear etiology with multiple admission and ED visits. CT abdomen with and without contrast (02/2011)  showed no acute process. Gastic Emptying scan (01/2010) was normal. Ultrasound of the abdomen was within normal limits. Hepatitis B viral load was undectable. HIV NR. EGD - gastritis, Hpylori + s/p Rx  . Obesity   . OSA (obstructive sleep apnea)    "suppose to wear mask but I don't" (04/22/2014)  . Peripheral neuropathy ([place])   . Pneumonia    "this is probably the 2nd or 3rd time I've had pneumonia" (04/22/2014)  . Recurrent boils   . Seasonal asthma   . Substance abuse   . Thrombocytosis ([place])    Hem/Onc suggested 2/2 chronic hepatits and/or iron deficiency anemia    Patient Active Problem List   Diagnosis Date Noted  . Non-intractable cyclical vomiting with nausea   . Dehydration 03/27/2015  . Intractable nausea and  vomiting 11/11/2014  . Gastroparesis 11/11/2014  . Cough   . Hematemesis 10/13/2014  . DKA (diabetic ketoacidoses) ([place]) 10/13/2014  . Increased anion gap metabolic acidosis 0000000  . Tachycardia 07/21/2014  . Abdominal pain, chronic, left lower quadrant   . Nausea with vomiting   . Diabetic gastroparesis associated with type 2 diabetes mellitus ([place]) 04/28/2014  . History of Helicobacter pylori infection 04/22/2014  . Vaginal discharge 02/18/2014  . Atypical chest pain 07/22/2013  . Unspecified constipation 07/21/2013  . Tinea corporis 07/21/2013  . Intractable vomiting 04/29/2013  . Dysmenorrhea 04/22/2013  .  Headache(784.0) 02/08/2012  . Health care maintenance 01/22/2012  . Chronic hepatitis B ([place]) 03/07/2011  . History of leukocytosis 04/06/2010  . THROMBOCYTOSIS 04/06/2010  . Polysubstance abuse 02/23/2010  . Iron deficiency anemia 11/22/2009  . PERIPHERAL NEUROPATHY 10/01/2009  . Hyperlipidemia 08/30/2009  . POLYNEUROPATHY, DIABETIC 08/30/2009  . Hidradenitis (recurrent boils) 07/07/2008  . Depression 12/27/2007  . Abdominal pain, left lower quadrant 11/21/2007  . FIBROMYALGIA 10/30/2007  . BACK PAIN 04/01/2007  . OBSTRUCTIVE SLEEP APNEA 01/17/2007  . ANXIETY DEPRESSION 06/27/2006  . Chronic ischemic heart disease 06/15/2006  . OBESITY, MORBID 05/15/2006  . MIGRAINE HEADACHE 05/15/2006  . Asthma 05/15/2006  . Essential hypertension 01/16/2006  . IRREGULAR MENSTRUATION 01/16/2006  . PEDAL EDEMA 01/16/2006  . Poorly controlled type 2 diabetes mellitus with peripheral neuropathy ([place]) 01/16/1989    Past Surgical History:  Procedure Laterality Date  . CESAREAN SECTION  1997  . CORONARY ANGIOPLASTY WITH STENT PLACEMENT  2008   "2 stents"  . ESOPHAGOGASTRODUODENOSCOPY N/A 04/23/2014   Procedure: ESOPHAGOGASTRODUODENOSCOPY (EGD);  Surgeon: Winfield Cunas., MD;  Location: [facility] ENDOSCOPY;  Service: Endoscopy;  Laterality: N/A;    OB History    No data available       Home Medications    Prior to Admission medications   Medication Sig Start Date End Date Taking? Authorizing Provider  albuterol (PROVENTIL HFA;VENTOLIN HFA) 108 (90 BASE) MCG/ACT inhaler Inhale 2 puffs into the lungs every 4 (four) hours as needed for wheezing or shortness of breath. 04/26/14  Yes Alexa Angela Burke, MD  atorvastatin (LIPITOR) 80 MG tablet Take 1 tablet (80 mg total) by mouth daily at 6 PM. 02/07/16  Yes Ledell Noss, MD  insulin aspart (NOVOLOG) 100 UNIT/ML injection Inject 1-6 Units into the skin 3 (three) times daily before meals. 180-200- 1 unit, 201-250=3 units, 251-300= 5 units, 301-350= 6 units,  351-400= 9 units and call MD   Yes Historical Provider, MD  Insulin Glargine (LANTUS SOLOSTAR) 100 UNIT/ML Solostar Pen Inject 10 Units into the skin daily at 10 pm. 01/31/16  Yes Shela Leff, MD  losartan (COZAAR) 25 MG tablet Take 1 tablet (25 mg total) by mouth daily. 02/07/16  Yes Ledell Noss, MD  traMADol (ULTRAM) 50 MG tablet Take 1 tablet (50 mg total) by mouth every 6 (six) hours as needed. Patient taking differently: Take 50 mg by mouth every 6 (six) hours as needed for moderate pain.  03/05/16  Yes Milton Ferguson, MD  cephALEXin (KEFLEX) 500 MG capsule Take 1 capsule (500 mg total) by mouth 2 (two) times daily. Patient not taking: Reported on 03/17/2016 03/15/16   Okey Regal, PA-C  ferrous sulfate 325 (65 FE) MG tablet Take 1 tablet (325 mg total) by mouth daily with breakfast. Patient not taking: Reported on 03/05/2016 02/07/16   Ledell Noss, MD  metoCLOPramide (REGLAN) 10 MG tablet Take 1 tablet (10 mg total) by mouth every 6 (  six) hours. Patient not taking: Reported on 03/17/2016 03/15/16   Okey Regal, PA-C  metoCLOPramide (REGLAN) 10 MG tablet Take 1 tablet (10 mg total) by mouth every 6 (six) hours as needed for nausea. Patient not taking: Reported on 03/17/2016 03/15/16   Okey Regal, PA-C  promethazine (PHENERGAN) 25 MG suppository Place 1 suppository (25 mg total) rectally every 6 (six) hours as needed for nausea or vomiting. Patient not taking: Reported on 03/17/2016 03/15/16   Okey Regal, PA-C  ranitidine (ZANTAC) 150 MG capsule Take 1 capsule (150 mg total) by mouth 2 (two) times daily. Patient not taking: Reported on 03/17/2016 03/05/16   Milton Ferguson, MD    Family History Family History  Problem Relation Age of Onset  . Diabetes Father     Social History Social History  Substance Use Topics  . Smoking status: Former Smoker    Types: Cigarettes    Quit date: 04/24/1996  . Smokeless tobacco: Never Used     Comment: quit smoking cigarettes age 44  .  Alcohol use 0.0 oz/week     Comment: 04/22/2014 "might have a few drinks a month"     Allergies   Lisinopril and Morphine and related   Review of Systems Review of Systems  Gastrointestinal: Positive for abdominal pain.  All other systems reviewed and are negative.    Physical Exam Updated Vital Signs BP (!) 183/91   Pulse (!) 120   Temp 98.9 F (37.2 C) (Oral)   Resp 22   LMP 03/03/2016   SpO2 100%   Physical Exam  Constitutional: She is oriented to person, place, and time. She appears well-developed and well-nourished.  Uncomfortable appearing, writhing on the stretcher  HENT:  Head: Normocephalic and atraumatic.  Cardiovascular: Regular rhythm.   No murmur heard. Tachycardiac  Pulmonary/Chest: Effort normal and breath sounds normal. No respiratory distress.  Abdominal: Soft. There is no rebound and no guarding.  Mild diffuse abdominal tenderness  Musculoskeletal: She exhibits no edema or tenderness.  Neurological: She is alert and oriented to person, place, and time.  Skin: Skin is warm and dry.  Psychiatric: She has a normal mood and affect. Her behavior is normal.  Nursing note and vitals reviewed.    ED Treatments / Results  Labs (all labs ordered are listed, but only abnormal results are displayed) Labs Reviewed  COMPREHENSIVE METABOLIC PANEL - Abnormal; Notable for the following:       Result Value   Glucose, Bld 172 (*)    BUN 22 (*)    Creatinine, Ser 1.26 (*)    ALT 12 (*)    GFR calc non Af Amer 52 (*)    All other components within normal limits  CBC - Abnormal; Notable for the following:    WBC 11.6 (*)    Hemoglobin 10.0 (*)    HCT 31.0 (*)    MCV 63.5 (*)    MCH 20.5 (*)    RDW 21.0 (*)    Platelets 478 (*)    All other components within normal limits  URINALYSIS, ROUTINE W REFLEX MICROSCOPIC (NOT AT [facility]) - Abnormal; Notable for the following:    Glucose, UA 100 (*)    Ketones, ur 15 (*)    All other components within normal limits   GLUCOSE, CAPILLARY - Abnormal; Notable for the following:    Glucose-Capillary 138 (*)    All other components within normal limits  LIPASE, BLOOD  RAPID URINE DRUG SCREEN, HOSP PERFORMED  BASIC METABOLIC PANEL  CBC  I-STAT BETA HCG BLOOD, ED ([facility], [facility], [facility] ONLY)  I-STAT TROPOININ, ED    EKG  EKG Interpretation  Date/Time:  Friday March 17 2016 12:57:45 EST Ventricular Rate:  135 PR Interval:  118 QRS Duration: 68 QT Interval:  288 QTC Calculation: 432 R Axis:   77 Text Interpretation:  Sinus tachycardia Otherwise normal ECG Confirmed by Hazle Coca [phone]) on 03/17/2016 3:54:29 PM       Radiology No results found.  Procedures Procedures (including critical care time)  Medications Ordered in ED Medications  traMADol (ULTRAM) tablet 50 mg (not administered)  atorvastatin (LIPITOR) tablet 80 mg (not administered)  losartan (COZAAR) tablet 25 mg (not administered)  insulin glargine (LANTUS) injection 5 Units (not administered)  enoxaparin (LOVENOX) injection 40 mg (not administered)  0.9 %  sodium chloride infusion ( Intravenous New Bag/Given 03/17/16 2358)  acetaminophen (TYLENOL) tablet 650 mg (not administered)    Or  acetaminophen (TYLENOL) suppository 650 mg (not administered)  albuterol (PROVENTIL) (2.5 MG/3ML) 0.083% nebulizer solution 2.5 mg (not administered)  insulin aspart (novoLOG) injection 0-9 Units (not administered)  prochlorperazine (COMPAZINE) injection 10 mg (not administered)  pantoprazole (PROTONIX) injection 40 mg (not administered)  hydrALAZINE (APRESOLINE) injection 10 mg (10 mg Intravenous Given 03/17/16 2359)  metoCLOPramide (REGLAN) injection 10 mg (not administered)  lactated ringers bolus 1,000 mL (0 mLs Intravenous Stopped 03/17/16 2103)  haloperidol lactate (HALDOL) injection 2 mg (2 mg Intravenous Given 03/17/16 1625)  diphenhydrAMINE (BENADRYL) injection 25 mg (25 mg Intravenous Given 03/17/16 1624)  promethazine (PHENERGAN) injection  25 mg (25 mg Intravenous Given 03/17/16 1851)  famotidine (PEPCID) IVPB 20 mg premix (0 mg Intravenous Stopped 03/17/16 1908)  metoCLOPramide (REGLAN) injection 10 mg (10 mg Intravenous Given 03/17/16 1849)  cefTRIAXone (ROCEPHIN) 1 g in dextrose 5 % 50 mL IVPB (0 g Intravenous Stopped 03/17/16 1951)  LORazepam (ATIVAN) injection 1 mg (1 mg Intravenous Given 03/17/16 2010)  labetalol (NORMODYNE,TRANDATE) injection 10 mg (10 mg Intravenous Given 03/17/16 2124)  lactated ringers bolus 1,000 mL (0 mLs Intravenous Stopped 03/17/16 2240)     Initial Impression / Assessment and Plan / ED Course  I have reviewed the triage vital signs and the nursing notes.  Pertinent labs & imaging results that were available during my care of the patient were reviewed by me and considered in my medical decision making (see chart for details).  Clinical Course    Patient with history of diabetes and gastroparesis here with vomiting that she states is similar to her gastroparesis symptoms. She does have some left lower quadrant abdominal pain that she states is there whenever she has gastroparesis flares. She was given multiple medications in the emergency department to control her nausea and vomiting and discomfort she experienced ongoing vomiting. Labs demonstrates leukocytosis that is similar to priors, BMP with elevation in her creatinine compared to priors. Given persistent vomiting in the emergency department she was admitted to the hospitalist service for IV fluid hydration and observation.  Final Clinical Impressions(s) / ED Diagnoses   Final diagnoses:  None    New Prescriptions Current Discharge Medication List       Quintella Reichert, MD 03/18/16 [phone]

## 2016-03-18 ENCOUNTER — Telehealth (HOSPITAL_BASED_OUTPATIENT_CLINIC_OR_DEPARTMENT_OTHER): Payer: Self-pay

## 2016-03-18 DIAGNOSIS — F341 Dysthymic disorder: Secondary | ICD-10-CM

## 2016-03-18 DIAGNOSIS — I10 Essential (primary) hypertension: Secondary | ICD-10-CM

## 2016-03-18 DIAGNOSIS — G43A1 Cyclical vomiting, intractable: Secondary | ICD-10-CM

## 2016-03-18 LAB — CBC
HEMATOCRIT: 28.5 % — AB (ref 36.0–46.0)
Hemoglobin: 9.2 g/dL — ABNORMAL LOW (ref 12.0–15.0)
MCH: 20.5 pg — ABNORMAL LOW (ref 26.0–34.0)
MCHC: 32.3 g/dL (ref 30.0–36.0)
MCV: 63.6 fL — AB (ref 78.0–100.0)
PLATELETS: 465 10*3/uL — AB (ref 150–400)
RBC: 4.48 MIL/uL (ref 3.87–5.11)
RDW: 20.7 % — AB (ref 11.5–15.5)
WBC: 10.7 10*3/uL — AB (ref 4.0–10.5)

## 2016-03-18 LAB — GLUCOSE, CAPILLARY
Glucose-Capillary: 120 mg/dL — ABNORMAL HIGH (ref 65–99)
Glucose-Capillary: 199 mg/dL — ABNORMAL HIGH (ref 65–99)
Glucose-Capillary: 209 mg/dL — ABNORMAL HIGH (ref 65–99)
Glucose-Capillary: 256 mg/dL — ABNORMAL HIGH (ref 65–99)

## 2016-03-18 LAB — BASIC METABOLIC PANEL
Anion gap: 11 (ref 5–15)
BUN: 13 mg/dL (ref 6–20)
CHLORIDE: 104 mmol/L (ref 101–111)
CO2: 24 mmol/L (ref 22–32)
CREATININE: 0.96 mg/dL (ref 0.44–1.00)
Calcium: 9.1 mg/dL (ref 8.9–10.3)
GFR calc Af Amer: >60 mL/min (ref >60)
GFR calc non Af Amer: >60 mL/min (ref >60)
GLUCOSE: 124 mg/dL — AB (ref 65–99)
POTASSIUM: 3.4 mmol/L — AB (ref 3.5–5.1)
SODIUM: 139 mmol/L (ref 135–145)

## 2016-03-18 MED ORDER — BOOST / RESOURCE BREEZE PO LIQD: 1.0000 | Freq: Two times a day (BID) | ORAL | Status: DC

## 2016-03-18 MED ORDER — POTASSIUM CHLORIDE IN NACL 20-0.9 MEQ/L-% IV SOLN
INTRAVENOUS | Status: DC
  Administered 2016-03-18 – 2016-03-19 (×3): via INTRAVENOUS
  Filled 2016-03-18 (×3): qty 1000

## 2016-03-18 MED ORDER — KETOROLAC TROMETHAMINE 15 MG/ML IJ SOLN
15.0000 mg | Freq: Four times a day (QID) | INTRAMUSCULAR | Status: DC | PRN
  Administered 2016-03-18 – 2016-03-20 (×3): 15 mg via INTRAVENOUS
  Filled 2016-03-18 (×3): qty 1

## 2016-03-18 NOTE — Telephone Encounter (Signed)
Post ED Visit - Positive Culture Follow-up  Culture report reviewed by antimicrobial stewardship pharmacist:  []  Elenor Quinones, Pharm.D. []  Heide Guile, Pharm.D., BCPS []  Parks Neptune, Pharm.D. []  Alycia Rossetti, Pharm.D., BCPS []  [place], Pharm.D., BCPS, AAHIVP []  Legrand Como, Pharm.D., BCPS, AAHIVP []  Milus Glazier, Pharm.D. []  Rob Evette Doffing, Pharm.D. Sharilyn Sites Pharm D Positive urine culture Treated with Cephalexin, organism sensitive to the same and no further patient follow-up is required at this time.  Genia Del 03/18/2016, 9:55 AM

## 2016-03-18 NOTE — Progress Notes (Signed)
[facility] PROGRESS NOTE  Shirley [name] [id number] DOB: [birth year] DOA: 03/17/2016   PCP: No PCP Per Patient     Assessment/Plan: Principal Problem:   Intractable nausea and vomiting Active Problems:   Poorly controlled type 2 diabetes mellitus with peripheral neuropathy ([place])   ANXIETY DEPRESSION   Essential hypertension   Gastroparesis   40 y.o. female with medical history significant of HTN, HLD, CAD, DM type 2, gastroparesis; who presents with intractable nausea and vomiting for the last 4 days. Patient reports emesis is nonbloody. Associated symptoms include left lower quadrant abdominal pain that's sharp and constant. 11/22, and was diagnosed with a urinary tract infection. She was prescribed antibiotics, but patient never picked up this prescription.  Assessment and plan Intractable nausea and vomiting secondary to gastroparesis flare in the setting of urinary tract infection:  Acute. Patient with nausea and vomiting unresolved even after being treated with Zofran, Phenergan, Haldol, Ativan, Reglan, and 2 L of lactated Ringer's. Continue MedSurg Clear liquid diet, but will need to advance diet as tolerated - IV fluids NS at 100 ml/hr - Zofran/ Compazine IV prn N/V or refractory vomiting - Scheduled IV Reglan Recent imaging studies have been negative. Liver function and lipase within normal limits - Social work consult for inability to obtain medications due to lack of insurance  Hypertensive urgency: Blood pressure elevated as high as 202/123 on admission. Patient given labetalol IV 10 mg all in the emergency department. - Restart Cozaar once tolerating oral medications - Hydralazine IV Prn  Diabetes mellitus type 2, uncontrolled: Last hemoglobin A1c 7.7 on 10/9. Patient reports being noncompliant with all medications. Hemoglobin A1c 7.7 on 10/9 - Adjusted home Lantus down from 10 units to 5 units  - CBGs every before meals with sensitive sliding  scale insulin  Anemia: Hemoglobin just a few days ago was noted to be 10.7, but on admission 10. continue to monitor recheck CBC in a.m. -  check CBC in a.m.   Cocaine abuse: Patient's last UDS on 11/12 positive for cocaine, but negative on UDS today. Drug cessation counseling done   Anxiety/depression - Continue to monitor  Jerrye Bushy - IV  protonix for     DVT prophylaxsis Lovenox  Code Status:  Full code   Family Communication: Discussed in detail with the patient, all imaging results, lab results explained to the patient   Disposition Plan:  1-2 days      Consultants:  None  Procedures:  None  Antibiotics: Anti-infectives    Start     Dose/Rate Route Frequency Ordered Stop   03/17/16 1900  cefTRIAXone (ROCEPHIN) 1 g in dextrose 5 % 50 mL IVPB     1 g 100 mL/hr over 30 Minutes Intravenous  Once 03/17/16 1859 03/17/16 1951         HPI/Subjective: Continues to be nauseous   Objective: Vitals:   03/17/16 2334 03/17/16 2359 03/18/16 0610 03/18/16 0646  BP: (!) 183/91 (!) 183/91 138/80   Pulse: (!) 120  (!) 105   Resp:   18   Temp: 98.9 F (37.2 C)  98.9 F (37.2 C)   TempSrc: Oral  Oral   SpO2: 100%  100%   Weight:    91.7 kg (202 lb 1.6 oz)  Height:    5\' 7"  (1.702 m)    Intake/Output Summary (Last 24 hours) at 03/18/16 0739 Last data filed at 03/18/16 0600  Gross per 24 hour  Intake  2721 ml  Output              600 ml  Net             2121 ml    Exam:  Examination:  General exam: Appears calm and comfortable  Respiratory system: Clear to auscultation. Respiratory effort normal. Cardiovascular system: S1 & S2 heard, RRR. No JVD, murmurs, rubs, gallops or clicks. No pedal edema. Gastrointestinal system: Abdomen is nondistended, soft and nontender. No organomegaly or masses felt. Normal bowel sounds heard. Central nervous system: Alert and oriented. No focal neurological deficits. Extremities: Symmetric 5 x 5 power. Skin: No  rashes, lesions or ulcers Psychiatry: Judgement and insight appear normal. Mood & affect appropriate.     Data Reviewed: I have personally reviewed following labs and imaging studies  Micro Results Recent Results (from the past 240 hour(s))  Urine culture     Status: Abnormal   Collection Time: 03/15/16  3:55 PM  Result Value Ref Range Status   Specimen Description URINE, CLEAN CATCH  Final   Special Requests NONE  Final   Culture (A)  Final    >=100,000 COLONIES/mL GROUP B STREP(S.AGALACTIAE)ISOLATED TESTING AGAINST S. AGALACTIAE NOT ROUTINELY PERFORMED DUE TO PREDICTABILITY OF AMP/PEN/VAN SUSCEPTIBILITY.    Report Status 03/17/2016 FINAL  Final    Radiology Reports No results found.   CBC  Recent Labs Lab 03/15/16 1148 03/17/16 1223 03/18/16 0306  WBC 17.4* 11.6* 10.7*  HGB 10.7* 10.0* 9.2*  HCT 33.0* 31.0* 28.5*  PLT 606* 478* 465*  MCV 64.0* 63.5* 63.6*  MCH 20.7* 20.5* 20.5*  MCHC 32.4 32.3 32.3  RDW 21.5* 21.0* 20.7*  LYMPHSABS 1.4  --   --   MONOABS 0.5  --   --   EOSABS 0.0  --   --   BASOSABS 0.0  --   --     Chemistries   Recent Labs Lab 03/15/16 1148 03/17/16 1223 03/18/16 0306  NA 136 136 139  K 3.9 4.0 3.4*  CL 101 103 104  CO2 21* 22 24  GLUCOSE 236* 172* 124*  BUN 12 22* 13  CREATININE 1.05* 1.26* 0.96  CALCIUM 10.0 9.5 9.1  AST 24 21  --   ALT 11* 12*  --   ALKPHOS 62 48  --   BILITOT 0.7 0.3  --    ------------------------------------------------------------------------------------------------------------------ estimated creatinine clearance is 88.7 mL/min (by C-G formula based on SCr of 0.96 mg/dL). ------------------------------------------------------------------------------------------------------------------ No results for input(s): HGBA1C in the last 72 hours. ------------------------------------------------------------------------------------------------------------------ No results for input(s): CHOL, HDL, LDLCALC, TRIG,  CHOLHDL, LDLDIRECT in the last 72 hours. ------------------------------------------------------------------------------------------------------------------ No results for input(s): TSH, T4TOTAL, T3FREE, THYROIDAB in the last 72 hours.  Invalid input(s): FREET3 ------------------------------------------------------------------------------------------------------------------ No results for input(s): VITAMINB12, FOLATE, FERRITIN, TIBC, IRON, RETICCTPCT in the last 72 hours.  Coagulation profile No results for input(s): INR, PROTIME in the last 168 hours.  No results for input(s): DDIMER in the last 72 hours.  Cardiac Enzymes No results for input(s): CKMB, TROPONINI, MYOGLOBIN in the last 168 hours.  Invalid input(s): CK ------------------------------------------------------------------------------------------------------------------ Invalid input(s): POCBNP   CBG:  Recent Labs Lab 03/17/16 2350  GLUCAP 138*       Studies: No results found.    Lab Results  Component Value Date   HGBA1C 7.7 01/31/2016   HGBA1C 8.2 (H) 11/28/2015   HGBA1C 11.7 (H) 03/27/2015   Lab Results  Component Value Date   MICROALBUR 1.25 07/21/2013   LDLCALC 108 (H) 07/21/2013  CREATININE 0.96 03/18/2016       Scheduled Meds: . atorvastatin  80 mg Oral q1800  . enoxaparin (LOVENOX) injection  40 mg Subcutaneous Q24H  . insulin aspart  0-9 Units Subcutaneous TID WC  . insulin glargine  5 Units Subcutaneous Q2200  . losartan  25 mg Oral Daily  . metoCLOPramide (REGLAN) injection  10 mg Intravenous Q6H  . pantoprazole (PROTONIX) IV  40 mg Intravenous Q12H   Continuous Infusions: . sodium chloride 100 mL/hr at 03/17/16 2358     LOS: 0 days    Time spent: >30 MINS    [facility]  [facility] Pager [phone]. If 7PM-7AM, please contact night-coverage at [email or web address], password [facility] 03/18/2016, 7:39 AM  LOS: 0 days

## 2016-03-18 NOTE — Progress Notes (Signed)
Patient admitted to room 6N27 from ED via wheelchair.Alert and oriented x4. BP 183/91 and HR 120. Oriented to room and call bell.Shirley [name]

## 2016-03-18 NOTE — Progress Notes (Signed)
Initial Nutrition Assessment  DOCUMENTATION CODES:   Obesity unspecified  INTERVENTION:  - Will order Boost Breeze BID, each supplement provides 250 kcal and 9 grams of protein - Diet advancement as medically feasible. - RD will provide gastroparesis diet-related education at follow-up.   NUTRITION DIAGNOSIS:   Inadequate protein intake related to other (see comment) (current diet order) as evidenced by other (see comment) (CLD does not meet estimated protein needs).  GOAL:   Patient will meet greater than or equal to 90% of their needs  MONITOR:   PO intake, Supplement acceptance, Diet advancement, Weight trends, Labs, I & O's  REASON FOR ASSESSMENT:   Malnutrition Screening Tool  ASSESSMENT:   40 y.o. female with medical history significant of HTN, HLD, CAD, DM type 2, gastroparesis; who presents with intractable nausea and vomiting for the last 4 days. Patient reports emesis is nonbloody. Associated symptoms include left lower quadrant abdominal pain that's sharp and constant. Denies any diarrhea, dysuria, shortness of breath, or fever. Patient did not try anything to relieve symptoms. Patient noticed that symptoms are similar to previous episodes in the past. Patient was seen in the hospital on 11/22, and was diagnosed with a urinary tract infection. She was prescribed antibiotics, but patient never picked up this prescription. Patient notes that she has not had insurance and therefore has not been able to afford any of her medications.   Pt seen for MST. BMI indicates obesity. Pt on CLD since 0620 today with no documented intakes. Pt sleeping soundly x2 attempted visits and no family/visitors at bedside at those times. Per chart review, pt with hx of DM (not taking medications d/t financial concerns) and gastroparesis. N/V and inability to keep down PO intakes began 2 days PTA, per chart review. Will provide diet-related education at follow-up to assist with this; pt may also  benefit from DM education if this seems warranted at time of follow-up.   Unable to complete physical assessment at this time and will do so at follow-up, if feasible. Per chart review, pt has had fluctuations in weight (200-214 lbs) over the past 3 months with most recent changes being 12 lb gain from 01/09/16-01/25/16 and then 10 lb weight loss from 01/25/16-03/17/16. This weight loss is 4.7% body weight in 1.5 months which is not significant for time frame.  Medications reviewed; 20 mg IV Pepcid x1 dose yesterday, sliding scale Novolog, 5 units Lantus/day, 10 mg IV Reglan QID starting today, 40 mg IV Protonix BID, PRN IV Compazine. Labs reviewed; K: 3.4 mmol/L, CBGs: 120 and 199 mg/dL this AM.   IVF: NS @ 100 mL/hr.    Diet Order:  Diet clear liquid Room service appropriate? Yes; Fluid consistency: Thin  Skin:  Reviewed, no issues  Last BM:  11/24  Height:   Ht Readings from Last 1 Encounters:  03/18/16 5\' 7"  (1.702 m)    Weight:   Wt Readings from Last 1 Encounters:  03/18/16 202 lb 1.6 oz (91.7 kg)    Ideal Body Weight:  61.36 kg  BMI:  Body mass index is 31.65 kg/m.  Estimated Nutritional Needs:   Kcal:  1650-1835 (18-20 kcal/kg)  Protein:  74-86 grams (1.2-1.4 grams/kg IBW)  Fluid:  >/= 1.8 L/day  EDUCATION NEEDS:   No education needs identified at this time    Jarome Matin, MS, RD, LDN, CNSC Inpatient Clinical Dietitian Pager # [phone] After hours/weekend pager # [phone]

## 2016-03-19 DIAGNOSIS — E1165 Type 2 diabetes mellitus with hyperglycemia: Secondary | ICD-10-CM

## 2016-03-19 DIAGNOSIS — E1142 Type 2 diabetes mellitus with diabetic polyneuropathy: Secondary | ICD-10-CM

## 2016-03-19 LAB — COMPREHENSIVE METABOLIC PANEL
ALBUMIN: 2.5 g/dL — AB (ref 3.5–5.0)
ALT: 7 U/L — ABNORMAL LOW (ref 14–54)
AST: 12 U/L — AB (ref 15–41)
Alkaline Phosphatase: 39 U/L (ref 38–126)
Anion gap: 6 (ref 5–15)
BUN: 16 mg/dL (ref 6–20)
CHLORIDE: 107 mmol/L (ref 101–111)
CO2: 22 mmol/L (ref 22–32)
Calcium: 8.5 mg/dL — ABNORMAL LOW (ref 8.9–10.3)
Creatinine, Ser: 1.19 mg/dL — ABNORMAL HIGH (ref 0.44–1.00)
GFR calc Af Amer: >60 mL/min (ref >60)
GFR calc non Af Amer: 55 mL/min — ABNORMAL LOW (ref >60)
GLUCOSE: 230 mg/dL — AB (ref 65–99)
POTASSIUM: 4.6 mmol/L (ref 3.5–5.1)
Sodium: 135 mmol/L (ref 135–145)
Total Bilirubin: 0.3 mg/dL (ref 0.3–1.2)
Total Protein: 5.8 g/dL — ABNORMAL LOW (ref 6.5–8.1)

## 2016-03-19 LAB — GLUCOSE, CAPILLARY
Glucose-Capillary: 208 mg/dL — ABNORMAL HIGH (ref 65–99)
Glucose-Capillary: 213 mg/dL — ABNORMAL HIGH (ref 65–99)
Glucose-Capillary: 226 mg/dL — ABNORMAL HIGH (ref 65–99)

## 2016-03-19 MED ORDER — INSULIN GLARGINE 100 UNIT/ML ~~LOC~~ SOLN
12.0000 [IU] | Freq: Every day | SUBCUTANEOUS | Status: DC
  Administered 2016-03-19: 12 [IU] via SUBCUTANEOUS
  Filled 2016-03-19 (×2): qty 0.12

## 2016-03-19 MED ORDER — DEXTROSE 5 % IV SOLN
1.0000 g | INTRAVENOUS | Status: DC
  Administered 2016-03-19 – 2016-03-20 (×2): 1 g via INTRAVENOUS
  Filled 2016-03-19 (×2): qty 10

## 2016-03-19 MED ORDER — INSULIN GLARGINE 100 UNIT/ML ~~LOC~~ SOLN
10.0000 [IU] | Freq: Once | SUBCUTANEOUS | Status: AC
  Administered 2016-03-19: 10 [IU] via SUBCUTANEOUS
  Filled 2016-03-19: qty 0.1

## 2016-03-19 MED ORDER — PHENAZOPYRIDINE HCL 200 MG PO TABS
200.0000 mg | ORAL_TABLET | Freq: Three times a day (TID) | ORAL | Status: DC
  Administered 2016-03-19 – 2016-03-20 (×5): 200 mg via ORAL
  Filled 2016-03-19 (×6): qty 1

## 2016-03-19 MED ORDER — PANTOPRAZOLE SODIUM 40 MG PO TBEC
40.0000 mg | DELAYED_RELEASE_TABLET | Freq: Two times a day (BID) | ORAL | Status: DC
  Administered 2016-03-19 – 2016-03-20 (×3): 40 mg via ORAL
  Filled 2016-03-19 (×3): qty 1

## 2016-03-19 NOTE — Progress Notes (Addendum)
[facility] PROGRESS NOTE  Shirley [name] [id number] DOB: [birth year] DOA: 03/17/2016   PCP: No PCP Per Patient     Assessment/Plan: Principal Problem:   Intractable nausea and vomiting Active Problems:   Poorly controlled type 2 diabetes mellitus with peripheral neuropathy ([place])   ANXIETY DEPRESSION   Essential hypertension   Gastroparesis   40 y.o. female with medical history significant of HTN, HLD, CAD, DM type 2, gastroparesis; who presents with intractable nausea and vomiting for the last 4 days. Patient reports emesis is nonbloody. Associated symptoms include left lower quadrant abdominal pain that's sharp and constant. 11/22, and was diagnosed with a urinary tract infection. She was prescribed antibiotics, but patient never picked up this prescription.  Assessment and plan Intractable nausea and vomiting secondary to gastroparesis flare in the setting of urinary tract infection:  Acute. Patient with nausea and vomiting unresolved even after being treated with Zofran, Phenergan, Haldol, Ativan, Reglan,  Dc ivf  Continue MedSurg   advance diet as tolerated,  - Zofran/ Compazine IV prn N/V or refractory vomiting - Scheduled IV Reglan Recent imaging studies have been negative. Liver function and lipase within normal limits - Social work consult for inability to obtain medications due to lack of insurance  UTI Continue rocephin ,start pyridium  Hypertensive urgency: Blood pressure elevated as high as 202/123 on admission. Patient given labetalol IV 10 mg all in the emergency department.  Continue Cozaar once tolerating oral medications - Hydralazine IV Prn  Diabetes mellitus type 2, uncontrolled: Last hemoglobin A1c 7.7 on 10/9. Patient reports being noncompliant with all medications. Hemoglobin A1c 7.7 on 10/9 - Increase Lantus to 12 units at bedtime, extra 10 units this morning. CBG elevated - CBGs every before meals with sensitive sliding scale  insulin  Anemia: Hemoglobin just a few days ago was noted to be 10.7, but on admission 10. continue to monitor recheck CBC in a.m. -  check CBC in a.m.   Cocaine abuse: Patient's last UDS on 11/12 positive for cocaine, but negative on UDS today. Drug cessation counseling done   Anxiety/depression - Continue to monitor  Jerrye Bushy - IV  protonix for     DVT prophylaxsis Lovenox  Code Status:  Full code   Family Communication: Discussed in detail with the patient, all imaging results, lab results explained to the patient   Disposition Plan: Anticipate discharge tomorrow      Consultants:  None  Procedures:  None  Antibiotics: Anti-infectives    Start     Dose/Rate Route Frequency Ordered Stop   03/17/16 1900  cefTRIAXone (ROCEPHIN) 1 g in dextrose 5 % 50 mL IVPB     1 g 100 mL/hr over 30 Minutes Intravenous  Once 03/17/16 1859 03/17/16 1951         HPI/Subjective: Complaining  of suprapubic discomfort  Objective: Vitals:   03/18/16 0646 03/18/16 1412 03/18/16 2148 03/19/16 0536  BP:  (!) 143/88 125/87 135/87  Pulse:  95 (!) 108 (!) 102  Resp:  18 19 18   Temp:  98.2 F (36.8 C) 98.7 F (37.1 C) 98.5 F (36.9 C)  TempSrc:  Oral Oral Oral  SpO2:  100% 100% 100%  Weight: 91.7 kg (202 lb 1.6 oz)     Height: 5\' 7"  (1.702 m)       Intake/Output Summary (Last 24 hours) at 03/19/16 0957 Last data filed at 03/19/16 0534  Gross per 24 hour  Intake  2802.67 ml  Output             1400 ml  Net          1402.67 ml    Exam:  Examination:  General exam: Appears calm and comfortable  Respiratory system: Clear to auscultation. Respiratory effort normal. Cardiovascular system: S1 & S2 heard, RRR. No JVD, murmurs, rubs, gallops or clicks. No pedal edema. Gastrointestinal system: Abdomen is nondistended, soft and nontender. No organomegaly or masses felt. Normal bowel sounds heard. Central nervous system: Alert and oriented. No focal neurological  deficits. Extremities: Symmetric 5 x 5 power. Skin: No rashes, lesions or ulcers Psychiatry: Judgement and insight appear normal. Mood & affect appropriate.     Data Reviewed: I have personally reviewed following labs and imaging studies  Micro Results Recent Results (from the past 240 hour(s))  Urine culture     Status: Abnormal   Collection Time: 03/15/16  3:55 PM  Result Value Ref Range Status   Specimen Description URINE, CLEAN CATCH  Final   Special Requests NONE  Final   Culture (A)  Final    >=100,000 COLONIES/mL GROUP B STREP(S.AGALACTIAE)ISOLATED TESTING AGAINST S. AGALACTIAE NOT ROUTINELY PERFORMED DUE TO PREDICTABILITY OF AMP/PEN/VAN SUSCEPTIBILITY.    Report Status 03/17/2016 FINAL  Final    Radiology Reports No results found.   CBC  Recent Labs Lab 03/15/16 1148 03/17/16 1223 03/18/16 0306  WBC 17.4* 11.6* 10.7*  HGB 10.7* 10.0* 9.2*  HCT 33.0* 31.0* 28.5*  PLT 606* 478* 465*  MCV 64.0* 63.5* 63.6*  MCH 20.7* 20.5* 20.5*  MCHC 32.4 32.3 32.3  RDW 21.5* 21.0* 20.7*  LYMPHSABS 1.4  --   --   MONOABS 0.5  --   --   EOSABS 0.0  --   --   BASOSABS 0.0  --   --     Chemistries   Recent Labs Lab 03/15/16 1148 03/17/16 1223 03/18/16 0306 03/19/16 0704  NA 136 136 139 135  K 3.9 4.0 3.4* 4.6  CL 101 103 104 107  CO2 21* 22 24 22   GLUCOSE 236* 172* 124* 230*  BUN 12 22* 13 16  CREATININE 1.05* 1.26* 0.96 1.19*  CALCIUM 10.0 9.5 9.1 8.5*  AST 24 21  --  12*  ALT 11* 12*  --  7*  ALKPHOS 62 48  --  39  BILITOT 0.7 0.3  --  0.3   ------------------------------------------------------------------------------------------------------------------ estimated creatinine clearance is 71.6 mL/min (by C-G formula based on SCr of 1.19 mg/dL (H)). ------------------------------------------------------------------------------------------------------------------ No results for input(s): HGBA1C in the last 72  hours. ------------------------------------------------------------------------------------------------------------------ No results for input(s): CHOL, HDL, LDLCALC, TRIG, CHOLHDL, LDLDIRECT in the last 72 hours. ------------------------------------------------------------------------------------------------------------------ No results for input(s): TSH, T4TOTAL, T3FREE, THYROIDAB in the last 72 hours.  Invalid input(s): FREET3 ------------------------------------------------------------------------------------------------------------------ No results for input(s): VITAMINB12, FOLATE, FERRITIN, TIBC, IRON, RETICCTPCT in the last 72 hours.  Coagulation profile No results for input(s): INR, PROTIME in the last 168 hours.  No results for input(s): DDIMER in the last 72 hours.  Cardiac Enzymes No results for input(s): CKMB, TROPONINI, MYOGLOBIN in the last 168 hours.  Invalid input(s): CK ------------------------------------------------------------------------------------------------------------------ Invalid input(s): POCBNP   CBG:  Recent Labs Lab 03/18/16 0758 03/18/16 1150 03/18/16 1718 03/18/16 2143 03/19/16 0730  GLUCAP 120* 199* 209* 256* 208*       Studies: No results found.    Lab Results  Component Value Date   HGBA1C 7.7 01/31/2016   HGBA1C 8.2 (H) 11/28/2015   HGBA1C  11.7 (H) 03/27/2015   Lab Results  Component Value Date   MICROALBUR 1.25 07/21/2013   LDLCALC 108 (H) 07/21/2013   CREATININE 1.19 (H) 03/19/2016       Scheduled Meds: . atorvastatin  80 mg Oral q1800  . enoxaparin (LOVENOX) injection  40 mg Subcutaneous Q24H  . feeding supplement  1 Container Oral BID BM  . insulin aspart  0-9 Units Subcutaneous TID WC  . insulin glargine  10 Units Subcutaneous Once  . insulin glargine  12 Units Subcutaneous Q2200  . losartan  25 mg Oral Daily  . metoCLOPramide (REGLAN) injection  10 mg Intravenous Q6H  . pantoprazole  40 mg Oral BID    Continuous Infusions:    LOS: 1 day    Time spent: >30 MINS    [facility]  [facility] Pager [phone]. If 7PM-7AM, please contact night-coverage at [email or web address], password [facility] 03/19/2016, 9:57 AM  LOS: 1 day

## 2016-03-20 ENCOUNTER — Inpatient Hospital Stay (HOSPITAL_COMMUNITY): Payer: Self-pay

## 2016-03-20 DIAGNOSIS — K3184 Gastroparesis: Secondary | ICD-10-CM

## 2016-03-20 LAB — GLUCOSE, CAPILLARY
GLUCOSE-CAPILLARY: 209 mg/dL — AB (ref 65–99)
GLUCOSE-CAPILLARY: 263 mg/dL — AB (ref 65–99)
Glucose-Capillary: 157 mg/dL — ABNORMAL HIGH (ref 65–99)
Glucose-Capillary: 224 mg/dL — ABNORMAL HIGH (ref 65–99)

## 2016-03-20 MED ORDER — INSULIN GLARGINE 100 UNIT/ML SOLOSTAR PEN: 10.0000 [IU] | PEN_INJECTOR | Freq: Every day | SUBCUTANEOUS | 13 refills | Status: DC

## 2016-03-20 MED ORDER — METOCLOPRAMIDE HCL 10 MG PO TABS: 10.0000 mg | ORAL_TABLET | Freq: Three times a day (TID) | ORAL | 1 refills | Status: DC

## 2016-03-20 MED ORDER — LOSARTAN POTASSIUM 25 MG PO TABS: 25.0000 mg | ORAL_TABLET | Freq: Every day | ORAL | 2 refills | Status: DC

## 2016-03-20 MED ORDER — CEPHALEXIN 500 MG PO CAPS: 500.0000 mg | ORAL_CAPSULE | Freq: Two times a day (BID) | ORAL | 0 refills | Status: DC

## 2016-03-20 MED ORDER — AMLODIPINE BESYLATE 10 MG PO TABS: 10.0000 mg | ORAL_TABLET | Freq: Every day | ORAL | 1 refills | Status: DC

## 2016-03-20 MED ORDER — PHENAZOPYRIDINE HCL 200 MG PO TABS: 200.0000 mg | ORAL_TABLET | Freq: Three times a day (TID) | ORAL | 0 refills | Status: DC

## 2016-03-20 MED ORDER — PANTOPRAZOLE SODIUM 40 MG PO TBEC: 40.0000 mg | DELAYED_RELEASE_TABLET | Freq: Every day | ORAL | 2 refills | Status: DC

## 2016-03-20 MED ORDER — PROMETHAZINE HCL 25 MG RE SUPP: 25.0000 mg | Freq: Four times a day (QID) | RECTAL | 0 refills | Status: DC | PRN

## 2016-03-20 NOTE — Progress Notes (Signed)
Pt discharging to home. Discussed discharge summaries and prescription given to pt. Pt understood and has no questions.
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
## 2016-03-20 NOTE — Discharge Summary (Signed)
Physician Discharge Summary  Shirley [name] MRN: [id number] DOB/AGE: 10/02/1975 40 y.o.  PCP: No PCP Per Patient   Admit date: 03/17/2016 Discharge date: 03/20/2016  Discharge Diagnoses:    Principal Problem:   Intractable nausea and vomiting Active Problems:   Poorly controlled type 2 diabetes mellitus with peripheral neuropathy ([place])   ANXIETY DEPRESSION   Essential hypertension   Gastroparesis    Follow-up recommendations Follow-up with PCP in 3-5 days , including all  additional recommended appointments as below Follow-up CBC, CMP in 3-5 days Please continue to follow renal function closely in the outpatient setting       Current Discharge Medication List    START taking these medications   Details  amLODipine (NORVASC) 10 MG tablet Take 1 tablet (10 mg total) by mouth daily. Qty: 30 tablet, Refills: 1    pantoprazole (PROTONIX) 40 MG tablet Take 1 tablet (40 mg total) by mouth daily. Qty: 30 tablet, Refills: 2    phenazopyridine (PYRIDIUM) 200 MG tablet Take 1 tablet (200 mg total) by mouth 3 (three) times daily with meals. Qty: 10 tablet, Refills: 0      CONTINUE these medications which have CHANGED   Details  cephALEXin (KEFLEX) 500 MG capsule Take 1 capsule (500 mg total) by mouth 2 (two) times daily. Qty: 10 capsule, Refills: 0    Insulin Glargine (LANTUS SOLOSTAR) 100 UNIT/ML Solostar Pen Inject 10 Units into the skin daily at 10 pm. Qty: 15 mL, Refills: 13   Associated Diagnoses: Poorly controlled type 2 diabetes mellitus with peripheral neuropathy (HCC)    losartan (COZAAR) 25 MG tablet Take 1 tablet (25 mg total) by mouth daily. Qty: 30 tablet, Refills: 2    metoCLOPramide (REGLAN) 10 MG tablet Take 1 tablet (10 mg total) by mouth 4 (four) times daily -  before meals and at bedtime. Qty: 120 tablet, Refills: 1    promethazine (PHENERGAN) 25 MG suppository Place 1 suppository (25 mg total) rectally every 6 (six) hours as needed for nausea or  vomiting. Qty: 30 each, Refills: 0      CONTINUE these medications which have NOT CHANGED   Details  albuterol (PROVENTIL HFA;VENTOLIN HFA) 108 (90 BASE) MCG/ACT inhaler Inhale 2 puffs into the lungs every 4 (four) hours as needed for wheezing or shortness of breath. Qty: 1 Inhaler, Refills: 4    atorvastatin (LIPITOR) 80 MG tablet Take 1 tablet (80 mg total) by mouth daily at 6 PM. Qty: 30 tablet, Refills: 0    insulin aspart (NOVOLOG) 100 UNIT/ML injection Inject 1-6 Units into the skin 3 (three) times daily before meals. 180-200- 1 unit, 201-250=3 units, 251-300= 5 units, 301-350= 6 units, 351-400= 9 units and call MD    traMADol (ULTRAM) 50 MG tablet Take 1 tablet (50 mg total) by mouth every 6 (six) hours as needed. Qty: 15 tablet, Refills: 0    ferrous sulfate 325 (65 FE) MG tablet Take 1 tablet (325 mg total) by mouth daily with breakfast. Qty: 30 tablet, Refills: 0      STOP taking these medications     ranitidine (ZANTAC) 150 MG capsule         Discharge Condition: Stable   Discharge Instructions Get Medicines reviewed and adjusted: Please take all your medications with you for your next visit with your Primary MD  Please request your Primary MD to go over all hospital tests and procedure/radiological results at the follow up, please ask your Primary MD to get all Hospital records  sent to his/her office.  If you experience worsening of your admission symptoms, develop shortness of breath, life threatening emergency, suicidal or homicidal thoughts you must seek medical attention immediately by calling 911 or calling your MD immediately if symptoms less severe.  You must read complete instructions/literature along with all the possible adverse reactions/side effects for all the Medicines you take and that have been prescribed to you. Take any new Medicines after you have completely understood and accpet all the possible adverse reactions/side effects.   Do not drive  when taking Pain medications.   Do not take more than prescribed Pain, Sleep and Anxiety Medications  Special Instructions: If you have smoked or chewed Tobacco in the last 2 yrs please stop smoking, stop any regular Alcohol and or any Recreational drug use.  Wear Seat belts while driving.  Please note  You were cared for by a hospitalist during your hospital stay. Once you are discharged, your primary care physician will handle any further medical issues. Please note that NO REFILLS for any discharge medications will be authorized once you are discharged, as it is imperative that you return to your primary care physician (or establish a relationship with a primary care physician if you do not have one) for your aftercare needs so that they can reassess your need for medications and monitor your lab values.     Allergies  Allergen Reactions  . Lisinopril Nausea Only and Swelling  . Morphine And Related Itching and Swelling      Disposition: 01-Home or Self Care   Consults:  None       Filed Weights   03/18/16 0646  Weight: 91.7 kg (202 lb 1.6 oz)     Microbiology: Recent Results (from the past 240 hour(s))  Urine culture     Status: Abnormal   Collection Time: 03/15/16  3:55 PM  Result Value Ref Range Status   Specimen Description URINE, CLEAN CATCH  Final   Special Requests NONE  Final   Culture (A)  Final    >=100,000 COLONIES/mL GROUP B STREP(S.AGALACTIAE)ISOLATED TESTING AGAINST S. AGALACTIAE NOT ROUTINELY PERFORMED DUE TO PREDICTABILITY OF AMP/PEN/VAN SUSCEPTIBILITY.    Report Status 03/17/2016 FINAL  Final       Blood Culture    Component Value Date/Time   SDES URINE, CLEAN CATCH 03/15/2016 1555   SPECREQUEST NONE 03/15/2016 1555   CULT (A) 03/15/2016 1555    >=100,000 COLONIES/mL GROUP B STREP(S.AGALACTIAE)ISOLATED TESTING AGAINST S. AGALACTIAE NOT ROUTINELY PERFORMED DUE TO PREDICTABILITY OF AMP/PEN/VAN SUSCEPTIBILITY.    REPTSTATUS 03/17/2016  FINAL 03/15/2016 1555      Labs: Results for orders placed or performed during the hospital encounter of 03/17/16 (from the past 48 hour(s))  Glucose, capillary     Status: Abnormal   Collection Time: 03/18/16 11:50 AM  Result Value Ref Range   Glucose-Capillary 199 (H) 65 - 99 mg/dL   Comment 1 Notify RN   Glucose, capillary     Status: Abnormal   Collection Time: 03/18/16  5:18 PM  Result Value Ref Range   Glucose-Capillary 209 (H) 65 - 99 mg/dL   Comment 1 Notify RN   Glucose, capillary     Status: Abnormal   Collection Time: 03/18/16  9:43 PM  Result Value Ref Range   Glucose-Capillary 256 (H) 65 - 99 mg/dL  Comprehensive metabolic panel     Status: Abnormal   Collection Time: 03/19/16  7:04 AM  Result Value Ref Range   Sodium 135 135 -  145 mmol/L   Potassium 4.6 3.5 - 5.1 mmol/L    Comment: DELTA CHECK NOTED   Chloride 107 101 - 111 mmol/L   CO2 22 22 - 32 mmol/L   Glucose, Bld 230 (H) 65 - 99 mg/dL   BUN 16 6 - 20 mg/dL   Creatinine, Ser 1.19 (H) 0.44 - 1.00 mg/dL   Calcium 8.5 (L) 8.9 - 10.3 mg/dL   Total Protein 5.8 (L) 6.5 - 8.1 g/dL   Albumin 2.5 (L) 3.5 - 5.0 g/dL   AST 12 (L) 15 - 41 U/L   ALT 7 (L) 14 - 54 U/L   Alkaline Phosphatase 39 38 - 126 U/L   Total Bilirubin 0.3 0.3 - 1.2 mg/dL   GFR calc non Af Amer 55 (L) >60 mL/min   GFR calc Af Amer >60 >60 mL/min    Comment: (NOTE) The eGFR has been calculated using the CKD EPI equation. This calculation has not been validated in all clinical situations. eGFR's persistently <60 mL/min signify possible Chronic Kidney Disease.    Anion gap 6 5 - 15  Glucose, capillary     Status: Abnormal   Collection Time: 03/19/16  7:30 AM  Result Value Ref Range   Glucose-Capillary 208 (H) 65 - 99 mg/dL   Comment 1 Notify RN   Glucose, capillary     Status: Abnormal   Collection Time: 03/19/16 11:51 AM  Result Value Ref Range   Glucose-Capillary 213 (H) 65 - 99 mg/dL   Comment 1 Notify RN   Glucose, capillary      Status: Abnormal   Collection Time: 03/19/16 10:10 PM  Result Value Ref Range   Glucose-Capillary 226 (H) 65 - 99 mg/dL     Lipid Panel     Component Value Date/Time   CHOL 169 07/21/2013 1457   TRIG 100 07/21/2013 1457   HDL 41 07/21/2013 1457   CHOLHDL 4.1 07/21/2013 1457   VLDL 20 07/21/2013 1457   LDLCALC 108 (H) 07/21/2013 1457     Lab Results  Component Value Date   HGBA1C 7.7 01/31/2016   HGBA1C 8.2 (H) 11/28/2015   HGBA1C 11.7 (H) 03/27/2015        HPI :  40 y.o.femalewith medical history significant of HTN, HLD, CAD, DMtype 2, gastroparesis; who presents with intractable nausea and vomitingfor the last 4 days. Patient reports emesis is nonbloody. Associated symptoms include left lower quadrant abdominal pain that's sharp and constant.11/22, and was diagnosed with a urinary tract infection. She was prescribed antibiotics, but patient never picked up this prescription  HOSPITAL COURSE:   Intractable nausea and vomiting secondary to gastroparesis flare in the setting of urinary tract infection:  Initially found to have intractable nausea with no relief with Zofran, Phenergan, Haldol, Ativan, Reglan,     Now improving   advance diet as tolerated, currently on a regular diet and appetite has returned Still receiving Reglan scheduled doses  Liver function and lipase within normal limits    UTI due to group B strep, although this is normally not treated, patient continued to be symptomatic and therefore antibiotics were resumed Continue Pyridium, Keflex for another 5 days Renal ultrasound was done to rule out nephrolithiasis  Hypertensive urgency: Blood pressure elevated as high as 202/123 on admission. Patient given labetalol IV 10 mg all in the emergency department. Cozaar on hold for 1 more week pending renal function evaluation in the outpatient setting Will start patient on Norvasc  Diabetes mellitus type 2, uncontrolled: Last  hemoglobin A1c 7.7 on  10/9. Patient reports being noncompliant with all medications. Hemoglobin A1c 7.7 on 10/9 Continue Lantus, may need further adjustment in her home insulin regimen as her by mouth intake increases    Anemia: Hemoglobin just a few days ago was noted to be 10.7, but on admission 10. continue to monitor      Cocaine abuse: Patient's last UDS on 11/12 positive for cocaine, but negative on UDS today. Drug cessation counseling done   Anxiety/depression - Continue to monitor  Rolland Bimler on PPI due to intractable nausea      Discharge Exam:   Blood pressure (!) 155/97, pulse 98, temperature 98.8 F (37.1 C), temperature source Oral, resp. rate 19, height 5' 7"  (1.702 m), weight 91.7 kg (202 lb 1.6 oz), last menstrual period 03/03/2016, SpO2 100 %.   General exam: Appears calm and comfortable  Respiratory system: Clear to auscultation. Respiratory effort normal. Cardiovascular system: S1 & S2 heard, RRR. No JVD, murmurs, rubs, gallops or clicks. No pedal edema. Gastrointestinal system: Abdomen is nondistended, soft and nontender. No organomegaly or masses felt. Normal bowel sounds heard. Central nervous system: Alert and oriented. No focal neurological deficits. Extremities: Symmetric 5 x 5 power. Skin: No rashes, lesions or ulcers Psychiatry: Judgement and insight appear normal. Mood & affect appropriate.    Follow-up Information    [place]. Call.   Why:  Hospital follow-up Contact information: [address] [place] [address] [phone]          Signed: Reyne Dumas 03/20/2016, 8:24 AM        Time spent >45 mins

## 2016-03-22 ENCOUNTER — Inpatient Hospital Stay: Payer: Medicaid Other

## 2016-04-01 IMAGING — CR DG ABDOMEN 1V
1 series · 1 of 1 positions shown · non-contrast
Comparison: DG ABD ACUTE W/CHEST dated 07/30/2013

CLINICAL DATA: Abdominal pain

EXAM:
ABDOMEN - 1 VIEW

[t abdomen supine]
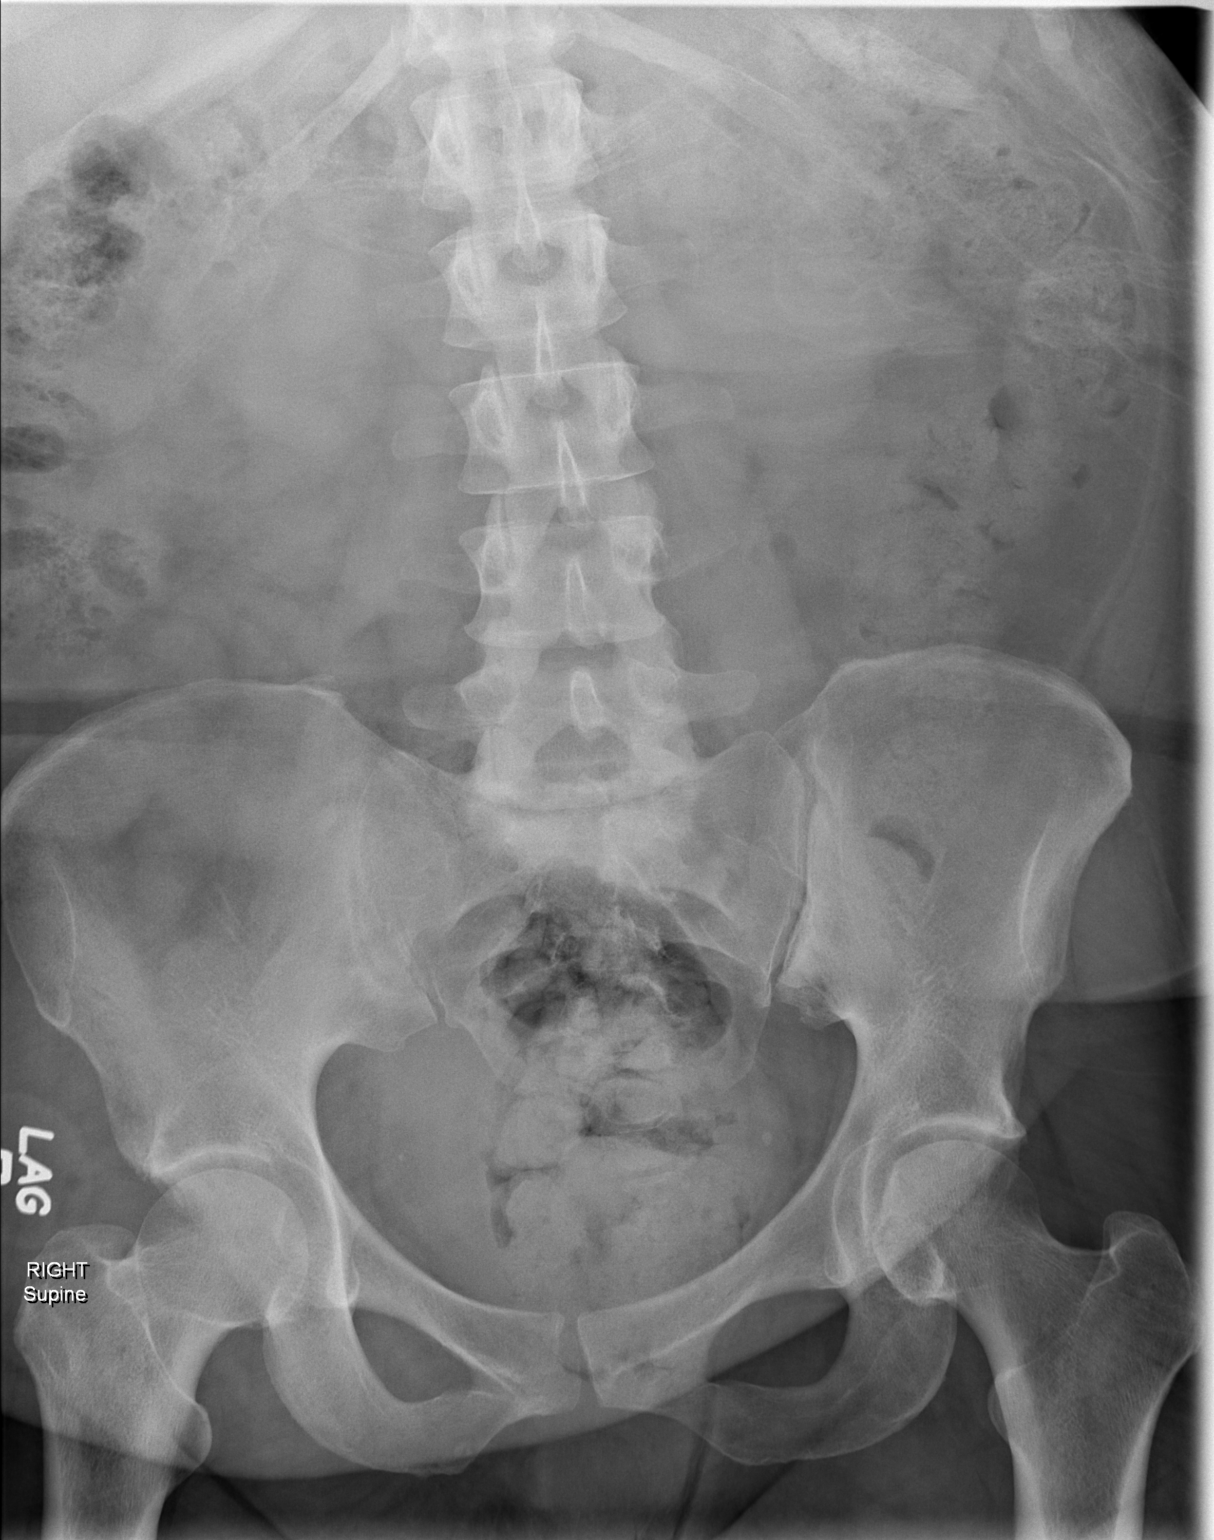

[1 of 1 positions shown; findings below may reference images not displayed]

FINDINGS: There is a moderate amount stool throughout the colon. There is no
bowel dilatation to suggest obstruction. There is no evidence of
pneumoperitoneum, portal venous gas or pneumatosis. There are no
pathologic calcifications along the expected course of the
ureters.The osseous structures are unremarkable.
IMPRESSION: Moderate of stool throughout the colon.

## 2016-04-06 ENCOUNTER — Observation Stay (HOSPITAL_COMMUNITY)
Admission: EM | Admit: 2016-04-06 | Discharge: 2016-04-07 | Discharge status: Against Medical Advice | Payer: Self-pay | Attending: Family Medicine | Admitting: Family Medicine

## 2016-04-06 ENCOUNTER — Encounter (HOSPITAL_COMMUNITY): Payer: Self-pay | Admitting: Emergency Medicine

## 2016-04-06 DIAGNOSIS — E1165 Type 2 diabetes mellitus with hyperglycemia: Secondary | ICD-10-CM | POA: Diagnosis present

## 2016-04-06 DIAGNOSIS — I252 Old myocardial infarction: Secondary | ICD-10-CM | POA: Insufficient documentation

## 2016-04-06 DIAGNOSIS — E1143 Type 2 diabetes mellitus with diabetic autonomic (poly)neuropathy: Secondary | ICD-10-CM | POA: Diagnosis present

## 2016-04-06 DIAGNOSIS — R1032 Left lower quadrant pain: Principal | ICD-10-CM | POA: Insufficient documentation

## 2016-04-06 DIAGNOSIS — Z955 Presence of coronary angioplasty implant and graft: Secondary | ICD-10-CM | POA: Insufficient documentation

## 2016-04-06 DIAGNOSIS — K3184 Gastroparesis: Secondary | ICD-10-CM | POA: Insufficient documentation

## 2016-04-06 DIAGNOSIS — D509 Iron deficiency anemia, unspecified: Secondary | ICD-10-CM | POA: Insufficient documentation

## 2016-04-06 DIAGNOSIS — Z87891 Personal history of nicotine dependence: Secondary | ICD-10-CM | POA: Insufficient documentation

## 2016-04-06 DIAGNOSIS — E669 Obesity, unspecified: Secondary | ICD-10-CM | POA: Insufficient documentation

## 2016-04-06 DIAGNOSIS — Z79899 Other long term (current) drug therapy: Secondary | ICD-10-CM | POA: Insufficient documentation

## 2016-04-06 DIAGNOSIS — I10 Essential (primary) hypertension: Secondary | ICD-10-CM | POA: Diagnosis present

## 2016-04-06 DIAGNOSIS — K219 Gastro-esophageal reflux disease without esophagitis: Secondary | ICD-10-CM | POA: Insufficient documentation

## 2016-04-06 DIAGNOSIS — E785 Hyperlipidemia, unspecified: Secondary | ICD-10-CM | POA: Insufficient documentation

## 2016-04-06 DIAGNOSIS — J45909 Unspecified asthma, uncomplicated: Secondary | ICD-10-CM | POA: Insufficient documentation

## 2016-04-06 DIAGNOSIS — Z794 Long term (current) use of insulin: Secondary | ICD-10-CM | POA: Insufficient documentation

## 2016-04-06 DIAGNOSIS — I2510 Atherosclerotic heart disease of native coronary artery without angina pectoris: Secondary | ICD-10-CM | POA: Insufficient documentation

## 2016-04-06 DIAGNOSIS — Z9114 Patient's other noncompliance with medication regimen: Secondary | ICD-10-CM | POA: Insufficient documentation

## 2016-04-06 DIAGNOSIS — R112 Nausea with vomiting, unspecified: Secondary | ICD-10-CM | POA: Diagnosis present

## 2016-04-06 LAB — COMPREHENSIVE METABOLIC PANEL
ALK PHOS: 62 U/L (ref 38–126)
ALT: 9 U/L — AB (ref 14–54)
AST: 18 U/L (ref 15–41)
Albumin: 3.3 g/dL — ABNORMAL LOW (ref 3.5–5.0)
Anion gap: 9 (ref 5–15)
BILIRUBIN TOTAL: 0.4 mg/dL (ref 0.3–1.2)
BUN: 7 mg/dL (ref 6–20)
CALCIUM: 9.5 mg/dL (ref 8.9–10.3)
CO2: 24 mmol/L (ref 22–32)
CREATININE: 0.95 mg/dL (ref 0.44–1.00)
Chloride: 107 mmol/L (ref 101–111)
GFR calc non Af Amer: >60 mL/min (ref >60)
GLUCOSE: 168 mg/dL — AB (ref 65–99)
Potassium: 3.7 mmol/L (ref 3.5–5.1)
SODIUM: 140 mmol/L (ref 135–145)
TOTAL PROTEIN: 7.9 g/dL (ref 6.5–8.1)

## 2016-04-06 LAB — URINALYSIS, ROUTINE W REFLEX MICROSCOPIC
BILIRUBIN URINE: NEGATIVE
Glucose, UA: 150 mg/dL — AB
HGB URINE DIPSTICK: NEGATIVE
KETONES UR: NEGATIVE mg/dL
Leukocytes, UA: NEGATIVE
NITRITE: NEGATIVE
PROTEIN: NEGATIVE mg/dL
Specific Gravity, Urine: 1.008 (ref 1.005–1.030)
pH: 7 (ref 5.0–8.0)

## 2016-04-06 LAB — CBC
HCT: 30.6 % — ABNORMAL LOW (ref 36.0–46.0)
Hemoglobin: 10.1 g/dL — ABNORMAL LOW (ref 12.0–15.0)
MCH: 21.3 pg — AB (ref 26.0–34.0)
MCHC: 33 g/dL (ref 30.0–36.0)
MCV: 64.6 fL — ABNORMAL LOW (ref 78.0–100.0)
PLATELETS: 568 10*3/uL — AB (ref 150–400)
RBC: 4.74 MIL/uL (ref 3.87–5.11)
RDW: 19.5 % — ABNORMAL HIGH (ref 11.5–15.5)
WBC: 14.4 10*3/uL — ABNORMAL HIGH (ref 4.0–10.5)

## 2016-04-06 LAB — LIPASE, BLOOD: Lipase: 23 U/L (ref 11–51)

## 2016-04-06 MED ORDER — SODIUM CHLORIDE 0.9 % IV BOLUS (SEPSIS)
2000.0000 mL | Freq: Once | INTRAVENOUS | Status: AC
  Administered 2016-04-06: 2000 mL via INTRAVENOUS

## 2016-04-06 MED ORDER — KETOROLAC TROMETHAMINE 30 MG/ML IJ SOLN
30.0000 mg | Freq: Once | INTRAMUSCULAR | Status: AC
  Administered 2016-04-06: 30 mg via INTRAVENOUS
  Filled 2016-04-06: qty 1

## 2016-04-06 MED ORDER — LORAZEPAM 2 MG/ML IJ SOLN
1.0000 mg | Freq: Once | INTRAMUSCULAR | Status: AC
  Administered 2016-04-06: 1 mg via INTRAVENOUS
  Filled 2016-04-06: qty 1

## 2016-04-06 MED ORDER — PROMETHAZINE HCL 25 MG/ML IJ SOLN: 12.5000 mg | INTRAMUSCULAR | Status: DC

## 2016-04-06 MED ORDER — METOCLOPRAMIDE HCL 5 MG/ML IJ SOLN
10.0000 mg | INTRAMUSCULAR | Status: AC
  Administered 2016-04-06: 10 mg via INTRAVENOUS
  Filled 2016-04-06: qty 2

## 2016-04-06 MED ORDER — DICYCLOMINE HCL 10 MG/ML IM SOLN
20.0000 mg | Freq: Once | INTRAMUSCULAR | Status: AC
  Administered 2016-04-06: 20 mg via INTRAMUSCULAR
  Filled 2016-04-06: qty 2

## 2016-04-06 MED ORDER — SODIUM CHLORIDE 0.9 % IV BOLUS (SEPSIS)
1000.0000 mL | Freq: Once | INTRAVENOUS | Status: AC
  Administered 2016-04-06: 1000 mL via INTRAVENOUS

## 2016-04-06 MED ORDER — ONDANSETRON HCL 4 MG/2ML IJ SOLN
4.0000 mg | Freq: Once | INTRAMUSCULAR | Status: AC
  Administered 2016-04-06: 4 mg via INTRAVENOUS
  Filled 2016-04-06: qty 2

## 2016-04-06 MED ORDER — HALOPERIDOL LACTATE 5 MG/ML IJ SOLN
2.0000 mg | Freq: Once | INTRAMUSCULAR | Status: AC
  Administered 2016-04-06: 2 mg via INTRAVENOUS
  Filled 2016-04-06: qty 1

## 2016-04-06 MED ORDER — ONDANSETRON 4 MG PO TBDP
4.0000 mg | ORAL_TABLET | Freq: Once | ORAL | Status: AC | PRN
  Administered 2016-04-06: 4 mg via ORAL

## 2016-04-06 MED ORDER — FAMOTIDINE IN NACL 20-0.9 MG/50ML-% IV SOLN
20.0000 mg | Freq: Once | INTRAVENOUS | Status: AC
  Administered 2016-04-06: 20 mg via INTRAVENOUS
  Filled 2016-04-06: qty 50

## 2016-04-06 MED ORDER — ONDANSETRON 4 MG PO TBDP
ORAL_TABLET | ORAL | Status: AC
  Filled 2016-04-06: qty 1

## 2016-04-06 NOTE — ED Provider Notes (Signed)
[facility] DEPT Provider Note   CSN: [id number] Arrival date & time: 04/06/16  1829    History   Chief Complaint Chief Complaint  Patient presents with  . Emesis  . Abdominal Pain    HPI Shirley [name] is a 40 y.o. female.  40 year old female with multiple medical problems including coronary artery disease, CVA, diabetes mellitus and secondary gastroparesis, hypertension, dyslipidemia, and ACS presents to the emergency department for complaints of nausea and vomiting. She states the symptoms began 2 days ago. She states that she has not taken any medications, because she does not have anything at home to take. She has not followed up with a primary care doctor, because she does not have one. She reports worsening of her symptoms and that they are similar to past episodes of gastroparesis. She is complaining of pain to her left abdomen. Patient has been passing flatus today. Last bowel movement was yesterday. She has not had any fevers or abdominal surgeries. No urinary complaints. The patient was most recently discharged on 03/20/16 after admission for intractable N/V and gastroparesis.   The history is provided by the patient. No language interpreter was used.    Past Medical History:  Diagnosis Date  . Abscess of tunica vaginalis    10/09- Abundant S. aureus- sensitive to all abx  . Anxiety   . Blood dyscrasia   . CAD (coronary artery disease) 06/15/2006   s/p Subendocardial MI with PDA angioplasty(no stent) on 06/15/06 and relook  cath 06/19/06 showed patency of site. Cath 12/10- no restenosis or significant CAD progression  . CVA (cerebral vascular accident) ([place]) ~ 02/2014   denies residual on 04/22/2014  . CVA (cerebral vascular accident) [facility])    history of remote right cerebellar infarct noted on head CT at least since 10/2011  . Depression   . Diabetes mellitus type 2, uncontrolled, with complications ([place])   . Fibromyalgia   . Gastritis   . Gastroparesis    secondary to poorly controlled DM, last emptying study performed 01/2010  was normal but may be falsely positive as pt was on reglan  . GERD (gastroesophageal reflux disease)   . Hepatitis B, chronic (HCC)    Hep BeAb+,Hep B cAb+ & Hep BsAg+ (9/06)  . History of pyelonephritis    H/o GrpB Pyelonephritis (9/06) and UTI- 07/11- E.Coli, 12/10- GBS  . Hyperlipidemia   . Hypertension   . Iron deficiency anemia   . Irregular menses    Small ovarian follicles seen on [id number])  . MI (myocardial infarction) 05/2006   PDA percutaneous transluminal coronary angioplasty  . Migraine    "weekly" (04/22/2014)  . N&V (nausea and vomiting)    Chronic. Unclear etiology with multiple admission and ED visits. CT abdomen with and without contrast (02/2011)  showed no acute process. Gastic Emptying scan (01/2010) was normal. Ultrasound of the abdomen was within normal limits. Hepatitis B viral load was undectable. HIV NR. EGD - gastritis, Hpylori + s/p Rx  . Obesity   . OSA (obstructive sleep apnea)    "suppose to wear mask but I don't" (04/22/2014)  . Peripheral neuropathy ([place])   . Pneumonia    "this is probably the 2nd or 3rd time I've had pneumonia" (04/22/2014)  . Recurrent boils   . Seasonal asthma   . Substance abuse   . Thrombocytosis ([place])    Hem/Onc suggested 2/2 chronic hepatits and/or iron deficiency anemia    Patient Active Problem List   Diagnosis Date Noted  .  Non-intractable cyclical vomiting with nausea   . Dehydration 03/27/2015  . Intractable nausea and vomiting 11/11/2014  . Gastroparesis 11/11/2014  . Cough   . Hematemesis 10/13/2014  . DKA (diabetic ketoacidoses) ([place]) 10/13/2014  . Increased anion gap metabolic acidosis 0000000  . Tachycardia 07/21/2014  . Abdominal pain, chronic, left lower quadrant   . Nausea with vomiting   . Diabetic gastroparesis associated with type 2 diabetes mellitus ([place]) 04/28/2014  . History of Helicobacter pylori infection 04/22/2014  .  Vaginal discharge 02/18/2014  . Atypical chest pain 07/22/2013  . Unspecified constipation 07/21/2013  . Tinea corporis 07/21/2013  . Intractable vomiting 04/29/2013  . Dysmenorrhea 04/22/2013  . Headache(784.0) 02/08/2012  . Health care maintenance 01/22/2012  . Chronic hepatitis B ([place]) 03/07/2011  . History of leukocytosis 04/06/2010  . THROMBOCYTOSIS 04/06/2010  . Polysubstance abuse 02/23/2010  . Iron deficiency anemia 11/22/2009  . PERIPHERAL NEUROPATHY 10/01/2009  . Hyperlipidemia 08/30/2009  . POLYNEUROPATHY, DIABETIC 08/30/2009  . Hidradenitis (recurrent boils) 07/07/2008  . Depression 12/27/2007  . Abdominal pain, left lower quadrant 11/21/2007  . FIBROMYALGIA 10/30/2007  . BACK PAIN 04/01/2007  . OBSTRUCTIVE SLEEP APNEA 01/17/2007  . ANXIETY DEPRESSION 06/27/2006  . Chronic ischemic heart disease 06/15/2006  . OBESITY, MORBID 05/15/2006  . MIGRAINE HEADACHE 05/15/2006  . Asthma 05/15/2006  . Essential hypertension 01/16/2006  . IRREGULAR MENSTRUATION 01/16/2006  . PEDAL EDEMA 01/16/2006  . Poorly controlled type 2 diabetes mellitus with peripheral neuropathy ([place]) 01/16/1989    Past Surgical History:  Procedure Laterality Date  . CESAREAN SECTION  1997  . CORONARY ANGIOPLASTY WITH STENT PLACEMENT  2008   "2 stents"  . ESOPHAGOGASTRODUODENOSCOPY N/A 04/23/2014   Procedure: ESOPHAGOGASTRODUODENOSCOPY (EGD);  Surgeon: Winfield Cunas., MD;  Location: [facility] ENDOSCOPY;  Service: Endoscopy;  Laterality: N/A;    OB History    No data available       Home Medications    Prior to Admission medications   Medication Sig Start Date End Date Taking? Authorizing Provider  albuterol (PROVENTIL HFA;VENTOLIN HFA) 108 (90 BASE) MCG/ACT inhaler Inhale 2 puffs into the lungs every 4 (four) hours as needed for wheezing or shortness of breath. 04/26/14   Florinda Marker, MD  amLODipine (NORVASC) 10 MG tablet Take 1 tablet (10 mg total) by mouth daily. 03/20/16   Reyne Dumas, MD   atorvastatin (LIPITOR) 80 MG tablet Take 1 tablet (80 mg total) by mouth daily at 6 PM. 02/07/16   Ledell Noss, MD  cephALEXin (KEFLEX) 500 MG capsule Take 1 capsule (500 mg total) by mouth 2 (two) times daily. 03/20/16   Reyne Dumas, MD  ferrous sulfate 325 (65 FE) MG tablet Take 1 tablet (325 mg total) by mouth daily with breakfast. Patient not taking: Reported on 03/05/2016 02/07/16   Ledell Noss, MD  insulin aspart (NOVOLOG) 100 UNIT/ML injection Inject 1-6 Units into the skin 3 (three) times daily before meals. 180-200- 1 unit, 201-250=3 units, 251-300= 5 units, 301-350= 6 units, 351-400= 9 units and call MD    Historical Provider, MD  Insulin Glargine (LANTUS SOLOSTAR) 100 UNIT/ML Solostar Pen Inject 10 Units into the skin daily at 10 pm. 03/20/16   Reyne Dumas, MD  losartan (COZAAR) 25 MG tablet Take 1 tablet (25 mg total) by mouth daily. 03/27/16   Reyne Dumas, MD  metoCLOPramide (REGLAN) 10 MG tablet Take 1 tablet (10 mg total) by mouth 4 (four) times daily -  before meals and at bedtime. 03/20/16   Reyne Dumas,  MD  pantoprazole (PROTONIX) 40 MG tablet Take 1 tablet (40 mg total) by mouth daily. 03/20/16   Reyne Dumas, MD  phenazopyridine (PYRIDIUM) 200 MG tablet Take 1 tablet (200 mg total) by mouth 3 (three) times daily with meals. 03/20/16   Reyne Dumas, MD  promethazine (PHENERGAN) 25 MG suppository Place 1 suppository (25 mg total) rectally every 6 (six) hours as needed for nausea or vomiting. 03/20/16   Reyne Dumas, MD  traMADol (ULTRAM) 50 MG tablet Take 1 tablet (50 mg total) by mouth every 6 (six) hours as needed. Patient taking differently: Take 50 mg by mouth every 6 (six) hours as needed for moderate pain.  03/05/16   Milton Ferguson, MD    Family History Family History  Problem Relation Age of Onset  . Diabetes Father     Social History Social History  Substance Use Topics  . Smoking status: Former Smoker    Types: Cigarettes    Quit date: 04/24/1996  . Smokeless  tobacco: Never Used     Comment: quit smoking cigarettes age 64  . Alcohol use 0.0 oz/week     Comment: 04/22/2014 "might have a few drinks a month"     Allergies   Lisinopril and Morphine and related   Review of Systems Review of Systems Ten systems reviewed and are negative for acute change, except as noted in the HPI.    Physical Exam Updated Vital Signs BP (!) 162/124   Pulse (!) 122   Temp 99.1 F (37.3 C) (Oral)   Resp 24   LMP 03/03/2016   SpO2 100%   Physical Exam  Constitutional: She is oriented to person, place, and time. She appears well-developed and well-nourished. No distress.  Patient tearful, mildly agitated.  HENT:  Head: Normocephalic and atraumatic.  Eyes: Conjunctivae and EOM are normal. No scleral icterus.  Neck: Normal range of motion.  Cardiovascular: Regular rhythm and intact distal pulses.   Tachycardia  Pulmonary/Chest: Effort normal. No respiratory distress. She has no wheezes. She has no rales.  Lungs CTAB  Abdominal: Soft. She exhibits no distension and no mass. There is tenderness. There is no guarding.  Soft, obese abdomen with diffuse TTP. No masses or peritoneal signs. No focal TTP.  Musculoskeletal: Normal range of motion.  Neurological: She is alert and oriented to person, place, and time. She exhibits normal muscle tone. Coordination normal.  GCS 15. Moving all extremities.  Skin: Skin is warm and dry. No rash noted. She is not diaphoretic. No erythema. No pallor.  Psychiatric: She has a normal mood and affect. Her behavior is normal.  Nursing note and vitals reviewed.    ED Treatments / Results  Labs (all labs ordered are listed, but only abnormal results are displayed) Labs Reviewed  COMPREHENSIVE METABOLIC PANEL - Abnormal; Notable for the following:       Result Value   Glucose, Bld 168 (*)    Albumin 3.3 (*)    ALT 9 (*)    All other components within normal limits  CBC - Abnormal; Notable for the following:    WBC  14.4 (*)    Hemoglobin 10.1 (*)    HCT 30.6 (*)    MCV 64.6 (*)    MCH 21.3 (*)    RDW 19.5 (*)    Platelets 568 (*)    All other components within normal limits  URINALYSIS, ROUTINE W REFLEX MICROSCOPIC - Abnormal; Notable for the following:    Color, Urine STRAW (*)  APPearance HAZY (*)    Glucose, UA 150 (*)    All other components within normal limits  LIPASE, BLOOD    EKG  EKG Interpretation None       Radiology No results found.  Procedures Procedures (including critical care time)  Medications Ordered in ED Medications  sodium chloride 0.9 % bolus 2,000 mL (not administered)  ketorolac (TORADOL) 30 MG/ML injection 30 mg (not administered)  famotidine (PEPCID) IVPB 20 mg premix (not administered)  haloperidol lactate (HALDOL) injection 2 mg (not administered)  ondansetron (ZOFRAN-ODT) disintegrating tablet 4 mg (4 mg Oral Given 04/06/16 1838)     Initial Impression / Assessment and Plan / ED Course  I have reviewed the triage vital signs and the nursing notes.  Pertinent labs & imaging results that were available during my care of the patient were reviewed by me and considered in my medical decision making (see chart for details).  Clinical Course     9:59 PM Patient reassessed. Vitals have normalized. Tachycardia has resolved. Patient appears comfortable. She is ambulatory to the rest room to void. She has mild residual nausea. Will add Zofran. No c/o pain.  10:58 PM Patient has, again, started vomiting. Bentyl and Reglan ordered. BP and tachycardia likely secondary response to this. Suspect need for admission for symptom management.  11:34 PM Patient still with c/o nausea. She is shaking in the bed, c/o pain. Reports persistent nausea. Active retching has remained intermittent. Plan for admission for management; hx of same.  11:58 PM Case discussed with Dr. Hal Hope who will admit.   Final Clinical Impressions(s) / ED Diagnoses   Final  diagnoses:  Diabetic gastroparesis ([place])  Intractable vomiting with nausea, unspecified vomiting type    New Prescriptions New Prescriptions   No medications on file     Antonietta Breach, PA-C 04/06/16 [place], MD 04/07/16 [phone]

## 2016-04-06 NOTE — ED Notes (Signed)
Informed PA that pt's nausea has returned.

## 2016-04-06 NOTE — ED Triage Notes (Signed)
Pt sts difficult stick to have blood draw with IV start

## 2016-04-06 NOTE — ED Triage Notes (Signed)
Pt sts abd pain N/V starting last night; pt seen for same in past

## 2016-04-07 ENCOUNTER — Observation Stay (HOSPITAL_COMMUNITY): Payer: Self-pay

## 2016-04-07 ENCOUNTER — Encounter (HOSPITAL_COMMUNITY): Payer: Self-pay | Admitting: Internal Medicine

## 2016-04-07 DIAGNOSIS — K3184 Gastroparesis: Secondary | ICD-10-CM

## 2016-04-07 DIAGNOSIS — R112 Nausea with vomiting, unspecified: Secondary | ICD-10-CM

## 2016-04-07 DIAGNOSIS — E1165 Type 2 diabetes mellitus with hyperglycemia: Secondary | ICD-10-CM

## 2016-04-07 DIAGNOSIS — E1143 Type 2 diabetes mellitus with diabetic autonomic (poly)neuropathy: Secondary | ICD-10-CM

## 2016-04-07 LAB — RAPID URINE DRUG SCREEN, HOSP PERFORMED
AMPHETAMINES: NOT DETECTED
BARBITURATES: NOT DETECTED
BENZODIAZEPINES: NOT DETECTED
COCAINE: NOT DETECTED
Opiates: NOT DETECTED
TETRAHYDROCANNABINOL: NOT DETECTED

## 2016-04-07 LAB — CBC WITH DIFFERENTIAL/PLATELET
BASOS ABS: 0 10*3/uL (ref 0.0–0.1)
Basophils Relative: 0 %
Eosinophils Absolute: 0.1 10*3/uL (ref 0.0–0.7)
Eosinophils Relative: 1 %
HEMATOCRIT: 28.9 % — AB (ref 36.0–46.0)
Hemoglobin: 9.3 g/dL — ABNORMAL LOW (ref 12.0–15.0)
LYMPHS ABS: 3.3 10*3/uL (ref 0.7–4.0)
LYMPHS PCT: 24 %
MCH: 20.8 pg — ABNORMAL LOW (ref 26.0–34.0)
MCHC: 32.2 g/dL (ref 30.0–36.0)
MCV: 64.5 fL — ABNORMAL LOW (ref 78.0–100.0)
MONOS PCT: 6 %
Monocytes Absolute: 0.8 10*3/uL (ref 0.1–1.0)
Neutro Abs: 9.7 10*3/uL — ABNORMAL HIGH (ref 1.7–7.7)
Neutrophils Relative %: 69 %
Platelets: 520 10*3/uL — ABNORMAL HIGH (ref 150–400)
RBC: 4.48 MIL/uL (ref 3.87–5.11)
RDW: 19.7 % — ABNORMAL HIGH (ref 11.5–15.5)
WBC: 13.9 10*3/uL — AB (ref 4.0–10.5)

## 2016-04-07 LAB — COMPREHENSIVE METABOLIC PANEL
ALT: 7 U/L — ABNORMAL LOW (ref 14–54)
ANION GAP: 6 (ref 5–15)
AST: 17 U/L (ref 15–41)
Albumin: 3.1 g/dL — ABNORMAL LOW (ref 3.5–5.0)
Alkaline Phosphatase: 58 U/L (ref 38–126)
BUN: 5 mg/dL — ABNORMAL LOW (ref 6–20)
CO2: 25 mmol/L (ref 22–32)
Calcium: 8.9 mg/dL (ref 8.9–10.3)
Chloride: 110 mmol/L (ref 101–111)
Creatinine, Ser: 0.89 mg/dL (ref 0.44–1.00)
GFR calc Af Amer: >60 mL/min (ref >60)
Glucose, Bld: 135 mg/dL — ABNORMAL HIGH (ref 65–99)
POTASSIUM: 3.5 mmol/L (ref 3.5–5.1)
Sodium: 141 mmol/L (ref 135–145)
TOTAL PROTEIN: 7 g/dL (ref 6.5–8.1)
Total Bilirubin: 0.3 mg/dL (ref 0.3–1.2)

## 2016-04-07 LAB — PREGNANCY, URINE: PREG TEST UR: NEGATIVE

## 2016-04-07 LAB — GLUCOSE, CAPILLARY
GLUCOSE-CAPILLARY: 143 mg/dL — AB (ref 65–99)
Glucose-Capillary: 145 mg/dL — ABNORMAL HIGH (ref 65–99)
Glucose-Capillary: 130 mg/dL — ABNORMAL HIGH (ref 65–99)

## 2016-04-07 LAB — HCG, SERUM, QUALITATIVE: Preg, Serum: NEGATIVE

## 2016-04-07 MED ORDER — AMLODIPINE BESYLATE 10 MG PO TABS
10.0000 mg | ORAL_TABLET | Freq: Every day | ORAL | Status: DC
  Administered 2016-04-07: 10 mg via ORAL
  Filled 2016-04-07: qty 1

## 2016-04-07 MED ORDER — HYDRALAZINE HCL 20 MG/ML IJ SOLN
10.0000 mg | Freq: Once | INTRAMUSCULAR | Status: AC
  Administered 2016-04-07: 10 mg via INTRAVENOUS
  Filled 2016-04-07: qty 1

## 2016-04-07 MED ORDER — ONDANSETRON HCL 4 MG PO TABS: 4.0000 mg | ORAL_TABLET | Freq: Four times a day (QID) | ORAL | Status: DC | PRN

## 2016-04-07 MED ORDER — ACETAMINOPHEN 325 MG PO TABS: 650.0000 mg | ORAL_TABLET | Freq: Four times a day (QID) | ORAL | Status: DC | PRN

## 2016-04-07 MED ORDER — ONDANSETRON HCL 4 MG/2ML IJ SOLN
4.0000 mg | Freq: Four times a day (QID) | INTRAMUSCULAR | Status: DC | PRN
  Filled 2016-04-07: qty 2

## 2016-04-07 MED ORDER — INSULIN ASPART 100 UNIT/ML ~~LOC~~ SOLN
0.0000 [IU] | SUBCUTANEOUS | Status: DC
  Administered 2016-04-07 (×3): 1 [IU] via SUBCUTANEOUS

## 2016-04-07 MED ORDER — TRAMADOL HCL 50 MG PO TABS
100.0000 mg | ORAL_TABLET | Freq: Once | ORAL | Status: AC
  Administered 2016-04-07: 100 mg via ORAL
  Filled 2016-04-07: qty 2

## 2016-04-07 MED ORDER — SODIUM CHLORIDE 0.9 % IV SOLN
INTRAVENOUS | Status: DC
  Administered 2016-04-07: 02:00:00 via INTRAVENOUS

## 2016-04-07 MED ORDER — HYDROMORPHONE HCL 2 MG/ML IJ SOLN
0.5000 mg | Freq: Once | INTRAMUSCULAR | Status: AC
  Administered 2016-04-07: 0.5 mg via INTRAVENOUS
  Filled 2016-04-07: qty 1

## 2016-04-07 MED ORDER — METOCLOPRAMIDE HCL 5 MG/ML IJ SOLN
5.0000 mg | Freq: Four times a day (QID) | INTRAMUSCULAR | Status: DC
  Administered 2016-04-07: 5 mg via INTRAVENOUS
  Filled 2016-04-07: qty 2

## 2016-04-07 MED ORDER — ENOXAPARIN SODIUM 40 MG/0.4ML ~~LOC~~ SOLN: 40.0000 mg | SUBCUTANEOUS | Status: DC

## 2016-04-07 MED ORDER — LOSARTAN POTASSIUM 50 MG PO TABS: 25.0000 mg | ORAL_TABLET | Freq: Every day | ORAL | Status: DC

## 2016-04-07 MED ORDER — ALBUTEROL (5 MG/ML) CONTINUOUS INHALATION SOLN: 3.0000 mL | INHALATION_SOLUTION | RESPIRATORY_TRACT | Status: DC | PRN

## 2016-04-07 MED ORDER — ASPIRIN EC 81 MG PO TBEC
81.0000 mg | DELAYED_RELEASE_TABLET | Freq: Every day | ORAL | Status: DC
  Administered 2016-04-07: 81 mg via ORAL
  Filled 2016-04-07: qty 1

## 2016-04-07 MED ORDER — ATORVASTATIN CALCIUM 80 MG PO TABS: 80.0000 mg | ORAL_TABLET | Freq: Every day | ORAL | Status: DC

## 2016-04-07 MED ORDER — ACETAMINOPHEN 650 MG RE SUPP: 650.0000 mg | Freq: Four times a day (QID) | RECTAL | Status: DC | PRN

## 2016-04-07 MED ORDER — PANTOPRAZOLE SODIUM 40 MG PO TBEC
40.0000 mg | DELAYED_RELEASE_TABLET | Freq: Every day | ORAL | Status: DC
  Administered 2016-04-07: 40 mg via ORAL
  Filled 2016-04-07: qty 1

## 2016-04-07 NOTE — ED Notes (Signed)
Admitting provider bedside 

## 2016-04-07 NOTE — Progress Notes (Signed)
BP 180/102, HR 113, pt in pain, increased activity in bed. Baltazar Najjar, NP notified.

## 2016-04-07 NOTE — Progress Notes (Signed)
Patient left AMA @ [id number] had an 1130 appt

## 2016-04-07 NOTE — Discharge Summary (Signed)
AMA  Patient at this time expresses desire to leave the Hospital immidiately, patient has been warned that this is not Medically advisable at this time, and can result in Medical complications like Death and Disability, patient understands and accepts the risks involved and assumes full responsibilty of this decision.   Shirley [name] M.D on 04/07/2016 at 1:04 PM  [facility]   Last Note Below  History and Physical    Shirley [name] [id number] DOB: 1975-12-12 DOA: 04/06/2016  PCP: No PCP Per Patient  Patient coming from: Home.  Chief Complaint: Nausea vomiting.  HPI: Shirley [name] is a 40 y.o. female with history of diabetes mellitus type 2, CAD, hyperlipidemia, hypertension, polysubstance abuse presents to the ER because of persistent nausea vomiting with abdominal discomfort over the last 2 days. Patient's abdominal discomfort is mostly in the left upper and lower quadrants. Vomiting has been multiple episodes denies any blood in the vomitus. Had one episode of diarrhea 2 days ago which was self-limited. Patient has been previously admitted for diabetic gastroparesis and symptoms at this time appear to be similar. Abdomen exam appears soft and benign. Patient will be admitted for further observation.   Patient states she has not taken her diabetic medications for last 2 weeks.  ED Course: Reglan IV was given in the ER along with Toradol. Urine drug screen is negative. LFTs are within acceptable limits.  Review of Systems: As per HPI, rest all negative.   Past Medical History:  Diagnosis Date  . Abscess of tunica vaginalis    10/09- Abundant S. aureus- sensitive to all abx  . Anxiety   . Blood dyscrasia   . CAD (coronary artery disease) 06/15/2006   s/p Subendocardial MI with PDA angioplasty(no stent) on  06/15/06 and relook  cath 06/19/06 showed patency of site. Cath 12/10- no restenosis or significant CAD progression  . CVA (cerebral vascular accident) ([place]) ~ 02/2014   denies residual on 04/22/2014  . CVA (cerebral vascular accident) [facility])    history of remote right cerebellar infarct noted on head CT at least since 10/2011  . Depression   . Diabetes mellitus type 2, uncontrolled, with complications ([place])   . Fibromyalgia   . Gastritis   . Gastroparesis    secondary to poorly controlled DM, last emptying study performed 01/2010  was normal but may be falsely positive as pt was on reglan  . GERD (gastroesophageal reflux disease)   . Hepatitis B, chronic (HCC)    Hep BeAb+,Hep B cAb+ & Hep BsAg+ (9/06)  . History of pyelonephritis    H/o GrpB Pyelonephritis (9/06) and UTI- 07/11- E.Coli, 12/10- GBS  . Hyperlipidemia   . Hypertension   . Iron deficiency anemia   . Irregular menses    Small ovarian follicles seen on [id number]  . MI (myocardial infarction) 05/2006   PDA percutaneous transluminal coronary angioplasty  . Migraine    "weekly" (04/22/2014)  . N&V (nausea and vomiting)    Chronic. Unclear etiology with multiple admission and ED visits. CT abdomen with and without contrast (02/2011)  showed no acute process. Gastic Emptying scan (01/2010) was normal. Ultrasound of the abdomen was within  normal limits. Hepatitis B viral load was undectable. HIV NR. EGD - gastritis, Hpylori + s/p Rx  . Obesity   . OSA (obstructive sleep apnea)    "suppose to wear mask but I don't" (04/22/2014)  . Peripheral neuropathy ([place])   . Pneumonia    "this is probably the 2nd or 3rd time I've had pneumonia" (04/22/2014)  . Recurrent boils   . Seasonal asthma   . Substance abuse   . Thrombocytosis ([place])    Hem/Onc suggested 2/2 chronic hepatits and/or iron deficiency anemia         Past Surgical History:  Procedure Laterality Date  . CESAREAN SECTION  1997  .  CORONARY ANGIOPLASTY WITH STENT PLACEMENT  2008   "2 stents"  . ESOPHAGOGASTRODUODENOSCOPY N/A 04/23/2014   Procedure: ESOPHAGOGASTRODUODENOSCOPY (EGD);  Surgeon: Winfield Cunas., MD;  Location: [facility] ENDOSCOPY;  Service: Endoscopy;  Laterality: N/A;     reports that she quit smoking about 19 years ago. Her smoking use included Cigarettes. She has never used smokeless tobacco. She reports that she drinks alcohol. She reports that she does not use drugs.      Allergies  Allergen Reactions  . Lisinopril Nausea Only and Swelling  . Morphine And Related Itching and Swelling         Family History  Problem Relation Age of Onset  . Diabetes Father            Prior to Admission medications   Medication Sig Start Date End Date Taking? Authorizing Provider  albuterol (PROVENTIL HFA;VENTOLIN HFA) 108 (90 BASE) MCG/ACT inhaler Inhale 2 puffs into the lungs every 4 (four) hours as needed for wheezing or shortness of breath. 04/26/14   Florinda Marker, MD  amLODipine (NORVASC) 10 MG tablet Take 1 tablet (10 mg total) by mouth daily. 03/20/16   Reyne Dumas, MD  atorvastatin (LIPITOR) 80 MG tablet Take 1 tablet (80 mg total) by mouth daily at 6 PM. 02/07/16   Ledell Noss, MD  cephALEXin (KEFLEX) 500 MG capsule Take 1 capsule (500 mg total) by mouth 2 (two) times daily. 03/20/16   Reyne Dumas, MD  ferrous sulfate 325 (65 FE) MG tablet Take 1 tablet (325 mg total) by mouth daily with breakfast. Patient not taking: Reported on 03/05/2016 02/07/16   Ledell Noss, MD  insulin aspart (NOVOLOG) 100 UNIT/ML injection Inject 1-6 Units into the skin 3 (three) times daily before meals. 180-200- 1 unit, 201-250=3 units, 251-300= 5 units, 301-350= 6 units, 351-400= 9 units and call MD    Historical Provider, MD  Insulin Glargine (LANTUS SOLOSTAR) 100 UNIT/ML Solostar Pen Inject 10 Units into the skin daily at 10 pm. 03/20/16   Reyne Dumas, MD  losartan (COZAAR) 25 MG tablet Take 1 tablet (25  mg total) by mouth daily. 03/27/16   Reyne Dumas, MD  metoCLOPramide (REGLAN) 10 MG tablet Take 1 tablet (10 mg total) by mouth 4 (four) times daily -  before meals and at bedtime. 03/20/16   Reyne Dumas, MD  pantoprazole (PROTONIX) 40 MG tablet Take 1 tablet (40 mg total) by mouth daily. 03/20/16   Reyne Dumas, MD  phenazopyridine (PYRIDIUM) 200 MG tablet Take 1 tablet (200 mg total) by mouth 3 (three) times daily with meals. 03/20/16   Reyne Dumas, MD  promethazine (PHENERGAN) 25 MG suppository Place 1 suppository (25 mg total) rectally every 6 (six) hours as needed for nausea or vomiting. 03/20/16   Reyne Dumas, MD  traMADol (ULTRAM) 50 MG tablet  Take 1 tablet (50 mg total) by mouth every 6 (six) hours as needed. Patient taking differently: Take 50 mg by mouth every 6 (six) hours as needed for moderate pain.  03/05/16   Milton Ferguson, MD    Physical Exam:       Vitals:   04/06/16 2315 04/06/16 2345 04/07/16 0030 04/07/16 0045  BP: 174/96 176/92 190/92 186/88  Pulse: 89 105 115 (!) 121  Resp:      Temp:      TempSrc:      SpO2: 100% 100% 98% 100%      Constitutional: Moderately built and nourished.       Vitals:   04/06/16 2315 04/06/16 2345 04/07/16 0030 04/07/16 0045  BP: 174/96 176/92 190/92 186/88  Pulse: 89 105 115 (!) 121  Resp:      Temp:      TempSrc:      SpO2: 100% 100% 98% 100%   Eyes: Anicteric no pallor. ENMT: No discharge from the ears eyes nose and mouth. Neck: No mass felt. No neck rigidity. Respiratory: No rhonchi or crepitations. Cardiovascular: S1 and S2 heard. No murmurs appreciated. Abdomen: Soft nontender bowel sounds present. No guarding or rigidity. Musculoskeletal: No edema. Skin: No rash. Skin appears warm. Neurologic: Alert awake oriented to time place and person. Moves all extremities 5 x 5. Psychiatric: Appears normal. Normal affect.   Labs on Admission: I have personally reviewed following  labs and imaging studies  CBC:  Last Labs    Recent Labs Lab 04/06/16 1928  WBC 14.4*  HGB 10.1*  HCT 30.6*  MCV 64.6*  PLT 568*     Basic Metabolic Panel:  Last Labs    Recent Labs Lab 04/06/16 1928  NA 140  K 3.7  CL 107  CO2 24  GLUCOSE 168*  BUN 7  CREATININE 0.95  CALCIUM 9.5     GFR: CrCl cannot be calculated (Unknown ideal weight.). Liver Function Tests:  Last Labs    Recent Labs Lab 04/06/16 1928  AST 18  ALT 9*  ALKPHOS 62  BILITOT 0.4  PROT 7.9  ALBUMIN 3.3*      Last Labs    Recent Labs Lab 04/06/16 1928  LIPASE 23     Last Labs   No results for input(s): AMMONIA in the last 168 hours.   Coagulation Profile: Last Labs   No results for input(s): INR, PROTIME in the last 168 hours.   Cardiac Enzymes: Last Labs   No results for input(s): CKTOTAL, CKMB, CKMBINDEX, TROPONINI in the last 168 hours.   BNP (last 3 results) Recent Labs (within last 365 days)  No results for input(s): PROBNP in the last 8760 hours.   HbA1C: Recent Labs (last 2 labs)   No results for input(s): HGBA1C in the last 72 hours.   CBG: Last Labs   No results for input(s): GLUCAP in the last 168 hours.   Lipid Profile: Recent Labs (last 2 labs)   No results for input(s): CHOL, HDL, LDLCALC, TRIG, CHOLHDL, LDLDIRECT in the last 72 hours.   Thyroid Function Tests: Recent Labs (last 2 labs)   No results for input(s): TSH, T4TOTAL, FREET4, T3FREE, THYROIDAB in the last 72 hours.   Anemia Panel: Recent Labs (last 2 labs)   No results for input(s): VITAMINB12, FOLATE, FERRITIN, TIBC, IRON, RETICCTPCT in the last 72 hours.   Urine analysis: Labs (Brief)          Component Value Date/Time   COLORURINE  STRAW (A) 04/06/2016 1912   APPEARANCEUR HAZY (A) 04/06/2016 1912   LABSPEC 1.008 04/06/2016 1912   PHURINE 7.0 04/06/2016 1912   GLUCOSEU 150 (A) 04/06/2016 1912   GLUCOSEU > 1000 mg/dL (A) 07/07/2008 2115   HGBUR NEGATIVE  04/06/2016 1912   HGBUR negative 08/12/2008 1444   BILIRUBINUR NEGATIVE 04/06/2016 1912   BILIRUBINUR Negative 02/18/2014 1134   KETONESUR NEGATIVE 04/06/2016 1912   PROTEINUR NEGATIVE 04/06/2016 1912   UROBILINOGEN 0.2 11/11/2014 1658   NITRITE NEGATIVE 04/06/2016 1912   LEUKOCYTESUR NEGATIVE 04/06/2016 1912     Sepsis Labs: @LABRCNTIP (procalcitonin:4,lacticidven:4) )No results found for this or any previous visit (from the past 240 hour(s)).   Radiological Exams on Admission: Imaging Results (Last 48 hours)  No results found.     Assessment/Plan  1. Nausea vomiting probably from gastroparesis - acute abdominal series is pending. I have placed patient on Reglan 5 mg IV every 6 hourly for 2 doses and may consider further doses based on response. Avoid narcotics. Continue with gentle hydration and clear liquid diet for now. 2. Diabetes mellitus type 2 - patient has been noncompliant with her medications. For now I have placed patient on sliding scale coverage. Closely follow CBGs. 3. Hypertension uncontrolled - not sure if patient had been taking her medications. I have continued on all home medications including amlodipine and Cozaar. When necessary IV hydralazine for systolic blood pressure more than 160. 4. History of CAD status post PTCA - denies any chest pain. On statins and aspirin. 5. History of polysubstance abuse - urine drug screen is negative.   DVT prophylaxis: Lovenox. Code Status: Full code.  Family Communication: Discussed with patient.  Disposition Plan: Home.  Consults called: None.  Admission status: Observation.    Rise Patience MD [facility] Pager [phone].  If 7PM-7AM, please contact night-coverage [email or web address] Password [email or web address]  04/07/2016, 1:08 AM

## 2016-04-07 NOTE — H&P (Signed)
History and Physical    Shirley [name] [id number] DOB: [birth year] DOA: 04/06/2016  PCP: No PCP Per Patient  Patient coming from: Home.  Chief Complaint: Nausea vomiting.  HPI: Shirley [name] is a 40 y.o. female with history of diabetes mellitus type 2, CAD, hyperlipidemia, hypertension, polysubstance abuse presents to the ER because of persistent nausea vomiting with abdominal discomfort over the last 2 days. Patient's abdominal discomfort is mostly in the left upper and lower quadrants. Vomiting has been multiple episodes denies any blood in the vomitus. Had one episode of diarrhea 2 days ago which was self-limited. Patient has been previously admitted for diabetic gastroparesis and symptoms at this time appear to be similar. Abdomen exam appears soft and benign. Patient will be admitted for further observation.   Patient states she has not taken her diabetic medications for last 2 weeks.  ED Course: Reglan IV was given in the ER along with Toradol. Urine drug screen is negative. LFTs are within acceptable limits.  Review of Systems: As per HPI, rest all negative.   Past Medical History:  Diagnosis Date  . Abscess of tunica vaginalis    10/09- Abundant S. aureus- sensitive to all abx  . Anxiety   . Blood dyscrasia   . CAD (coronary artery disease) 06/15/2006   s/p Subendocardial MI with PDA angioplasty(no stent) on 06/15/06 and relook  cath 06/19/06 showed patency of site. Cath 12/10- no restenosis or significant CAD progression  . CVA (cerebral vascular accident) ([place]) ~ 02/2014   denies residual on 04/22/2014  . CVA (cerebral vascular accident) [facility])    history of remote right cerebellar infarct noted on head CT at least since 10/2011  . Depression   . Diabetes mellitus type 2, uncontrolled, with complications ([place])   . Fibromyalgia   . Gastritis   . Gastroparesis    secondary to poorly controlled DM, last emptying study performed 01/2010  was normal but may be falsely  positive as pt was on reglan  . GERD (gastroesophageal reflux disease)   . Hepatitis B, chronic (HCC)    Hep BeAb+,Hep B cAb+ & Hep BsAg+ (9/06)  . History of pyelonephritis    H/o GrpB Pyelonephritis (9/06) and UTI- 07/11- E.Coli, 12/10- GBS  . Hyperlipidemia   . Hypertension   . Iron deficiency anemia   . Irregular menses    Small ovarian follicles seen on [id number])  . MI (myocardial infarction) 05/2006   PDA percutaneous transluminal coronary angioplasty  . Migraine    "weekly" (04/22/2014)  . N&V (nausea and vomiting)    Chronic. Unclear etiology with multiple admission and ED visits. CT abdomen with and without contrast (02/2011)  showed no acute process. Gastic Emptying scan (01/2010) was normal. Ultrasound of the abdomen was within normal limits. Hepatitis B viral load was undectable. HIV NR. EGD - gastritis, Hpylori + s/p Rx  . Obesity   . OSA (obstructive sleep apnea)    "suppose to wear mask but I don't" (04/22/2014)  . Peripheral neuropathy ([place])   . Pneumonia    "this is probably the 2nd or 3rd time I've had pneumonia" (04/22/2014)  . Recurrent boils   . Seasonal asthma   . Substance abuse   . Thrombocytosis ([place])    Hem/Onc suggested 2/2 chronic hepatits and/or iron deficiency anemia    Past Surgical History:  Procedure Laterality Date  . CESAREAN SECTION  1997  . CORONARY ANGIOPLASTY WITH STENT PLACEMENT  2008   "2 stents"  . ESOPHAGOGASTRODUODENOSCOPY  N/A 04/23/2014   Procedure: ESOPHAGOGASTRODUODENOSCOPY (EGD);  Surgeon: Winfield Cunas., MD;  Location: [facility] ENDOSCOPY;  Service: Endoscopy;  Laterality: N/A;     reports that she quit smoking about 19 years ago. Her smoking use included Cigarettes. She has never used smokeless tobacco. She reports that she drinks alcohol. She reports that she does not use drugs.  Allergies  Allergen Reactions  . Lisinopril Nausea Only and Swelling  . Morphine And Related Itching and Swelling    Family History  Problem  Relation Age of Onset  . Diabetes Father     Prior to Admission medications   Medication Sig Start Date End Date Taking? Authorizing Provider  albuterol (PROVENTIL HFA;VENTOLIN HFA) 108 (90 BASE) MCG/ACT inhaler Inhale 2 puffs into the lungs every 4 (four) hours as needed for wheezing or shortness of breath. 04/26/14   Florinda Marker, MD  amLODipine (NORVASC) 10 MG tablet Take 1 tablet (10 mg total) by mouth daily. 03/20/16   Reyne Dumas, MD  atorvastatin (LIPITOR) 80 MG tablet Take 1 tablet (80 mg total) by mouth daily at 6 PM. 02/07/16   Ledell Noss, MD  cephALEXin (KEFLEX) 500 MG capsule Take 1 capsule (500 mg total) by mouth 2 (two) times daily. 03/20/16   Reyne Dumas, MD  ferrous sulfate 325 (65 FE) MG tablet Take 1 tablet (325 mg total) by mouth daily with breakfast. Patient not taking: Reported on 03/05/2016 02/07/16   Ledell Noss, MD  insulin aspart (NOVOLOG) 100 UNIT/ML injection Inject 1-6 Units into the skin 3 (three) times daily before meals. 180-200- 1 unit, 201-250=3 units, 251-300= 5 units, 301-350= 6 units, 351-400= 9 units and call MD    Historical Provider, MD  Insulin Glargine (LANTUS SOLOSTAR) 100 UNIT/ML Solostar Pen Inject 10 Units into the skin daily at 10 pm. 03/20/16   Reyne Dumas, MD  losartan (COZAAR) 25 MG tablet Take 1 tablet (25 mg total) by mouth daily. 03/27/16   Reyne Dumas, MD  metoCLOPramide (REGLAN) 10 MG tablet Take 1 tablet (10 mg total) by mouth 4 (four) times daily -  before meals and at bedtime. 03/20/16   Reyne Dumas, MD  pantoprazole (PROTONIX) 40 MG tablet Take 1 tablet (40 mg total) by mouth daily. 03/20/16   Reyne Dumas, MD  phenazopyridine (PYRIDIUM) 200 MG tablet Take 1 tablet (200 mg total) by mouth 3 (three) times daily with meals. 03/20/16   Reyne Dumas, MD  promethazine (PHENERGAN) 25 MG suppository Place 1 suppository (25 mg total) rectally every 6 (six) hours as needed for nausea or vomiting. 03/20/16   Reyne Dumas, MD  traMADol (ULTRAM) 50 MG  tablet Take 1 tablet (50 mg total) by mouth every 6 (six) hours as needed. Patient taking differently: Take 50 mg by mouth every 6 (six) hours as needed for moderate pain.  03/05/16   Milton Ferguson, MD    Physical Exam: Vitals:   04/06/16 2315 04/06/16 2345 04/07/16 0030 04/07/16 0045  BP: 174/96 176/92 190/92 186/88  Pulse: 89 105 115 (!) 121  Resp:      Temp:      TempSrc:      SpO2: 100% 100% 98% 100%      Constitutional: Moderately built and nourished. Vitals:   04/06/16 2315 04/06/16 2345 04/07/16 0030 04/07/16 0045  BP: 174/96 176/92 190/92 186/88  Pulse: 89 105 115 (!) 121  Resp:      Temp:      TempSrc:      SpO2: 100%  100% 98% 100%   Eyes: Anicteric no pallor. ENMT: No discharge from the ears eyes nose and mouth. Neck: No mass felt. No neck rigidity. Respiratory: No rhonchi or crepitations. Cardiovascular: S1 and S2 heard. No murmurs appreciated. Abdomen: Soft nontender bowel sounds present. No guarding or rigidity. Musculoskeletal: No edema. Skin: No rash. Skin appears warm. Neurologic: Alert awake oriented to time place and person. Moves all extremities 5 x 5. Psychiatric: Appears normal. Normal affect.   Labs on Admission: I have personally reviewed following labs and imaging studies  CBC:  Recent Labs Lab 04/06/16 1928  WBC 14.4*  HGB 10.1*  HCT 30.6*  MCV 64.6*  PLT [id number]*   Basic Metabolic Panel:  Recent Labs Lab 04/06/16 1928  NA 140  K 3.7  CL 107  CO2 24  GLUCOSE 168*  BUN 7  CREATININE 0.95  CALCIUM 9.5   GFR: CrCl cannot be calculated (Unknown ideal weight.). Liver Function Tests:  Recent Labs Lab 04/06/16 1928  AST 18  ALT 9*  ALKPHOS 62  BILITOT 0.4  PROT 7.9  ALBUMIN 3.3*    Recent Labs Lab 04/06/16 1928  LIPASE 23   No results for input(s): AMMONIA in the last 168 hours. Coagulation Profile: No results for input(s): INR, PROTIME in the last 168 hours. Cardiac Enzymes: No results for input(s): CKTOTAL,  CKMB, CKMBINDEX, TROPONINI in the last 168 hours. BNP (last 3 results) No results for input(s): PROBNP in the last 8760 hours. HbA1C: No results for input(s): HGBA1C in the last 72 hours. CBG: No results for input(s): GLUCAP in the last 168 hours. Lipid Profile: No results for input(s): CHOL, HDL, LDLCALC, TRIG, CHOLHDL, LDLDIRECT in the last 72 hours. Thyroid Function Tests: No results for input(s): TSH, T4TOTAL, FREET4, T3FREE, THYROIDAB in the last 72 hours. Anemia Panel: No results for input(s): VITAMINB12, FOLATE, FERRITIN, TIBC, IRON, RETICCTPCT in the last 72 hours. Urine analysis:    Component Value Date/Time   COLORURINE STRAW (A) 04/06/2016 1912   APPEARANCEUR HAZY (A) 04/06/2016 1912   LABSPEC 1.008 04/06/2016 1912   PHURINE 7.0 04/06/2016 1912   GLUCOSEU 150 (A) 04/06/2016 1912   GLUCOSEU > 1000 mg/dL (A) 07/07/2008 2115   HGBUR NEGATIVE 04/06/2016 1912   HGBUR negative 08/12/2008 1444   BILIRUBINUR NEGATIVE 04/06/2016 1912   BILIRUBINUR Negative 02/18/2014 1134   KETONESUR NEGATIVE 04/06/2016 1912   PROTEINUR NEGATIVE 04/06/2016 1912   UROBILINOGEN 0.2 11/11/2014 1658   NITRITE NEGATIVE 04/06/2016 1912   LEUKOCYTESUR NEGATIVE 04/06/2016 1912   Sepsis Labs: @LABRCNTIP (procalcitonin:4,lacticidven:4) )No results found for this or any previous visit (from the past 240 hour(s)).   Radiological Exams on Admission: No results found.   Assessment/Plan Active Problems:   Essential hypertension   Diabetic gastroparesis (HCC)   Type 2 diabetes mellitus with hyperglycemia (HCC)   Nausea & vomiting    1. Nausea vomiting probably from gastroparesis - acute abdominal series is pending. I have placed patient on Reglan 5 mg IV every 6 hourly for 2 doses and may consider further doses based on response. Avoid narcotics. Continue with gentle hydration and clear liquid diet for now. 2. Diabetes mellitus type 2 - patient has been noncompliant with her medications. For now I  have placed patient on sliding scale coverage. Closely follow CBGs. 3. Hypertension uncontrolled - not sure if patient had been taking her medications. I have continued on all home medications including amlodipine and Cozaar. When necessary IV hydralazine for systolic blood pressure more than 160. 4. History  of CAD status post PTCA - denies any chest pain. On statins and aspirin. 5. History of polysubstance abuse - urine drug screen is negative.   DVT prophylaxis: Lovenox. Code Status: Full code.  Family Communication: Discussed with patient.  Disposition Plan: Home.  Consults called: None.  Admission status: Observation.    Rise Patience MD [facility] Pager [phone].  If 7PM-7AM, please contact night-coverage [email or web address] Password [email or web address]  04/07/2016, 1:08 AM

## 2016-04-07 NOTE — Progress Notes (Signed)
Pt arrived to floor accompanied with ED RN. C/o abdominal pain 10/10, Baltazar Najjar, NP notified. Pt alert and oriented x 4. No acute distress noted. Pt oriented to room, call light within reach.

## 2016-05-29 ENCOUNTER — Encounter (HOSPITAL_COMMUNITY): Payer: Self-pay | Admitting: Emergency Medicine

## 2016-05-29 ENCOUNTER — Emergency Department (HOSPITAL_COMMUNITY): Payer: Self-pay

## 2016-05-29 ENCOUNTER — Emergency Department (HOSPITAL_COMMUNITY)
Admission: EM | Admit: 2016-05-29 | Discharge: 2016-05-29 | Disposition: A | Discharge status: Home / Self Care | Payer: Self-pay | Attending: Emergency Medicine | Admitting: Emergency Medicine

## 2016-05-29 DIAGNOSIS — R112 Nausea with vomiting, unspecified: Secondary | ICD-10-CM | POA: Insufficient documentation

## 2016-05-29 DIAGNOSIS — R197 Diarrhea, unspecified: Secondary | ICD-10-CM | POA: Insufficient documentation

## 2016-05-29 DIAGNOSIS — Z87891 Personal history of nicotine dependence: Secondary | ICD-10-CM | POA: Insufficient documentation

## 2016-05-29 DIAGNOSIS — E1142 Type 2 diabetes mellitus with diabetic polyneuropathy: Secondary | ICD-10-CM | POA: Insufficient documentation

## 2016-05-29 DIAGNOSIS — R079 Chest pain, unspecified: Secondary | ICD-10-CM | POA: Insufficient documentation

## 2016-05-29 DIAGNOSIS — Z79899 Other long term (current) drug therapy: Secondary | ICD-10-CM | POA: Insufficient documentation

## 2016-05-29 DIAGNOSIS — I10 Essential (primary) hypertension: Secondary | ICD-10-CM | POA: Insufficient documentation

## 2016-05-29 DIAGNOSIS — Z8673 Personal history of transient ischemic attack (TIA), and cerebral infarction without residual deficits: Secondary | ICD-10-CM | POA: Insufficient documentation

## 2016-05-29 DIAGNOSIS — I2510 Atherosclerotic heart disease of native coronary artery without angina pectoris: Secondary | ICD-10-CM | POA: Insufficient documentation

## 2016-05-29 DIAGNOSIS — Z794 Long term (current) use of insulin: Secondary | ICD-10-CM | POA: Insufficient documentation

## 2016-05-29 DIAGNOSIS — Z951 Presence of aortocoronary bypass graft: Secondary | ICD-10-CM | POA: Insufficient documentation

## 2016-05-29 DIAGNOSIS — W19XXXA Unspecified fall, initial encounter: Secondary | ICD-10-CM

## 2016-05-29 DIAGNOSIS — R32 Unspecified urinary incontinence: Secondary | ICD-10-CM | POA: Insufficient documentation

## 2016-05-29 LAB — URINALYSIS, ROUTINE W REFLEX MICROSCOPIC
BILIRUBIN URINE: NEGATIVE
Glucose, UA: NEGATIVE mg/dL
KETONES UR: NEGATIVE mg/dL
LEUKOCYTES UA: NEGATIVE
Nitrite: NEGATIVE
PROTEIN: 30 mg/dL — AB
SPECIFIC GRAVITY, URINE: 1.015 (ref 1.005–1.030)
pH: 5 (ref 5.0–8.0)

## 2016-05-29 LAB — I-STAT TROPONIN, ED
TROPONIN I, POC: 0 ng/mL (ref 0.00–0.08)
Troponin i, poc: 0 ng/mL (ref 0.00–0.08)

## 2016-05-29 LAB — RAPID URINE DRUG SCREEN, HOSP PERFORMED
AMPHETAMINES: NOT DETECTED
BARBITURATES: NOT DETECTED
Benzodiazepines: NOT DETECTED
COCAINE: POSITIVE — AB
Opiates: NOT DETECTED
TETRAHYDROCANNABINOL: NOT DETECTED

## 2016-05-29 LAB — LIPASE, BLOOD: Lipase: 21 U/L (ref 11–51)

## 2016-05-29 LAB — COMPREHENSIVE METABOLIC PANEL
ALBUMIN: 3.3 g/dL — AB (ref 3.5–5.0)
ALK PHOS: 42 U/L (ref 38–126)
ALT: 7 U/L — ABNORMAL LOW (ref 14–54)
ANION GAP: 7 (ref 5–15)
AST: 17 U/L (ref 15–41)
BILIRUBIN TOTAL: 0.6 mg/dL (ref 0.3–1.2)
BUN: 14 mg/dL (ref 6–20)
CALCIUM: 9.2 mg/dL (ref 8.9–10.3)
CO2: 22 mmol/L (ref 22–32)
Chloride: 108 mmol/L (ref 101–111)
Creatinine, Ser: 1.07 mg/dL — ABNORMAL HIGH (ref 0.44–1.00)
GFR calc Af Amer: >60 mL/min (ref >60)
GLUCOSE: 187 mg/dL — AB (ref 65–99)
Potassium: 3.9 mmol/L (ref 3.5–5.1)
Sodium: 137 mmol/L (ref 135–145)
TOTAL PROTEIN: 7.1 g/dL (ref 6.5–8.1)

## 2016-05-29 LAB — CBC
HEMATOCRIT: 25.6 % — AB (ref 36.0–46.0)
HEMOGLOBIN: 8 g/dL — AB (ref 12.0–15.0)
MCH: 20.3 pg — AB (ref 26.0–34.0)
MCHC: 31.3 g/dL (ref 30.0–36.0)
MCV: 64.8 fL — AB (ref 78.0–100.0)
Platelets: 533 10*3/uL — ABNORMAL HIGH (ref 150–400)
RBC: 3.95 MIL/uL (ref 3.87–5.11)
RDW: 18.8 % — AB (ref 11.5–15.5)
WBC: 10.5 10*3/uL (ref 4.0–10.5)

## 2016-05-29 LAB — I-STAT BETA HCG BLOOD, ED (MC, WL, AP ONLY)

## 2016-05-29 MED ORDER — LORAZEPAM 2 MG/ML IJ SOLN
1.0000 mg | Freq: Once | INTRAMUSCULAR | Status: AC
  Administered 2016-05-29: 1 mg via INTRAVENOUS
  Filled 2016-05-29: qty 1

## 2016-05-29 MED ORDER — PANTOPRAZOLE SODIUM 40 MG PO TBEC: 40.0000 mg | DELAYED_RELEASE_TABLET | Freq: Every day | ORAL | 0 refills | Status: DC

## 2016-05-29 MED ORDER — FERROUS SULFATE 325 (65 FE) MG PO TABS: 325.0000 mg | ORAL_TABLET | Freq: Every day | ORAL | 0 refills | Status: DC

## 2016-05-29 MED ORDER — LOSARTAN POTASSIUM 25 MG PO TABS: 25.0000 mg | ORAL_TABLET | Freq: Every day | ORAL | 0 refills | Status: DC

## 2016-05-29 MED ORDER — INSULIN ASPART 100 UNIT/ML ~~LOC~~ SOLN: 1.0000 [IU] | Freq: Three times a day (TID) | SUBCUTANEOUS | 0 refills | Status: DC

## 2016-05-29 MED ORDER — ATORVASTATIN CALCIUM 80 MG PO TABS: 80.0000 mg | ORAL_TABLET | Freq: Every day | ORAL | 0 refills | Status: DC

## 2016-05-29 MED ORDER — ONDANSETRON HCL 4 MG/2ML IJ SOLN
4.0000 mg | Freq: Once | INTRAMUSCULAR | Status: AC
  Administered 2016-05-29: 4 mg via INTRAVENOUS
  Filled 2016-05-29: qty 2

## 2016-05-29 MED ORDER — AMLODIPINE BESYLATE 10 MG PO TABS: 10.0000 mg | ORAL_TABLET | Freq: Every day | ORAL | 0 refills | Status: DC

## 2016-05-29 MED ORDER — SODIUM CHLORIDE 0.9 % IV BOLUS (SEPSIS)
1000.0000 mL | Freq: Once | INTRAVENOUS | Status: AC
  Administered 2016-05-29: 1000 mL via INTRAVENOUS

## 2016-05-29 MED ORDER — INSULIN GLARGINE 100 UNITS/ML SOLOSTAR PEN: 10.0000 [IU] | PEN_INJECTOR | Freq: Every day | SUBCUTANEOUS | 0 refills | Status: DC
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
## 2016-05-29 NOTE — Discharge Instructions (Signed)
I have refilled your medications for the next 2 weeks. [facility] with [facility]-- you need to get an ID for your appt. Return here for new concerns.

## 2016-05-29 NOTE — ED Triage Notes (Signed)
Pt from home via [facility] with c/o CP starting Sat followed by emesis.  Pt states starting yesterday she began having diarrhea, generalized weakness, and fell with c/o right hip pain, no deformity noted, and both bowel and bladder incontinence.  Hx MI, CVA with right sided deficits, gastroparesis.  Pt is unable to get her medications due to insurance issues.  No medications since Nov.  NAD, A&O.

## 2016-05-29 NOTE — ED Notes (Signed)
Pt transported to MRI. This RN gave pt 1mg  ativan prior to leaving

## 2016-05-30 NOTE — ED Notes (Addendum)
1 mg ativan wasted in sharps with Arboriculturist

## 2016-06-14 IMAGING — CR DG ABDOMEN 2V
2 series · 2 of 2 positions shown · non-contrast
Comparison: 07/31/2003 acute abdominal series. Abdominal pelvic CT
04/11/2013.

CLINICAL DATA: Abdominal pain with nausea and vomiting for 2 years.
History of diabetes and gastritis.

EXAM:
ABDOMEN - 2 VIEW

[w abdomen upright]
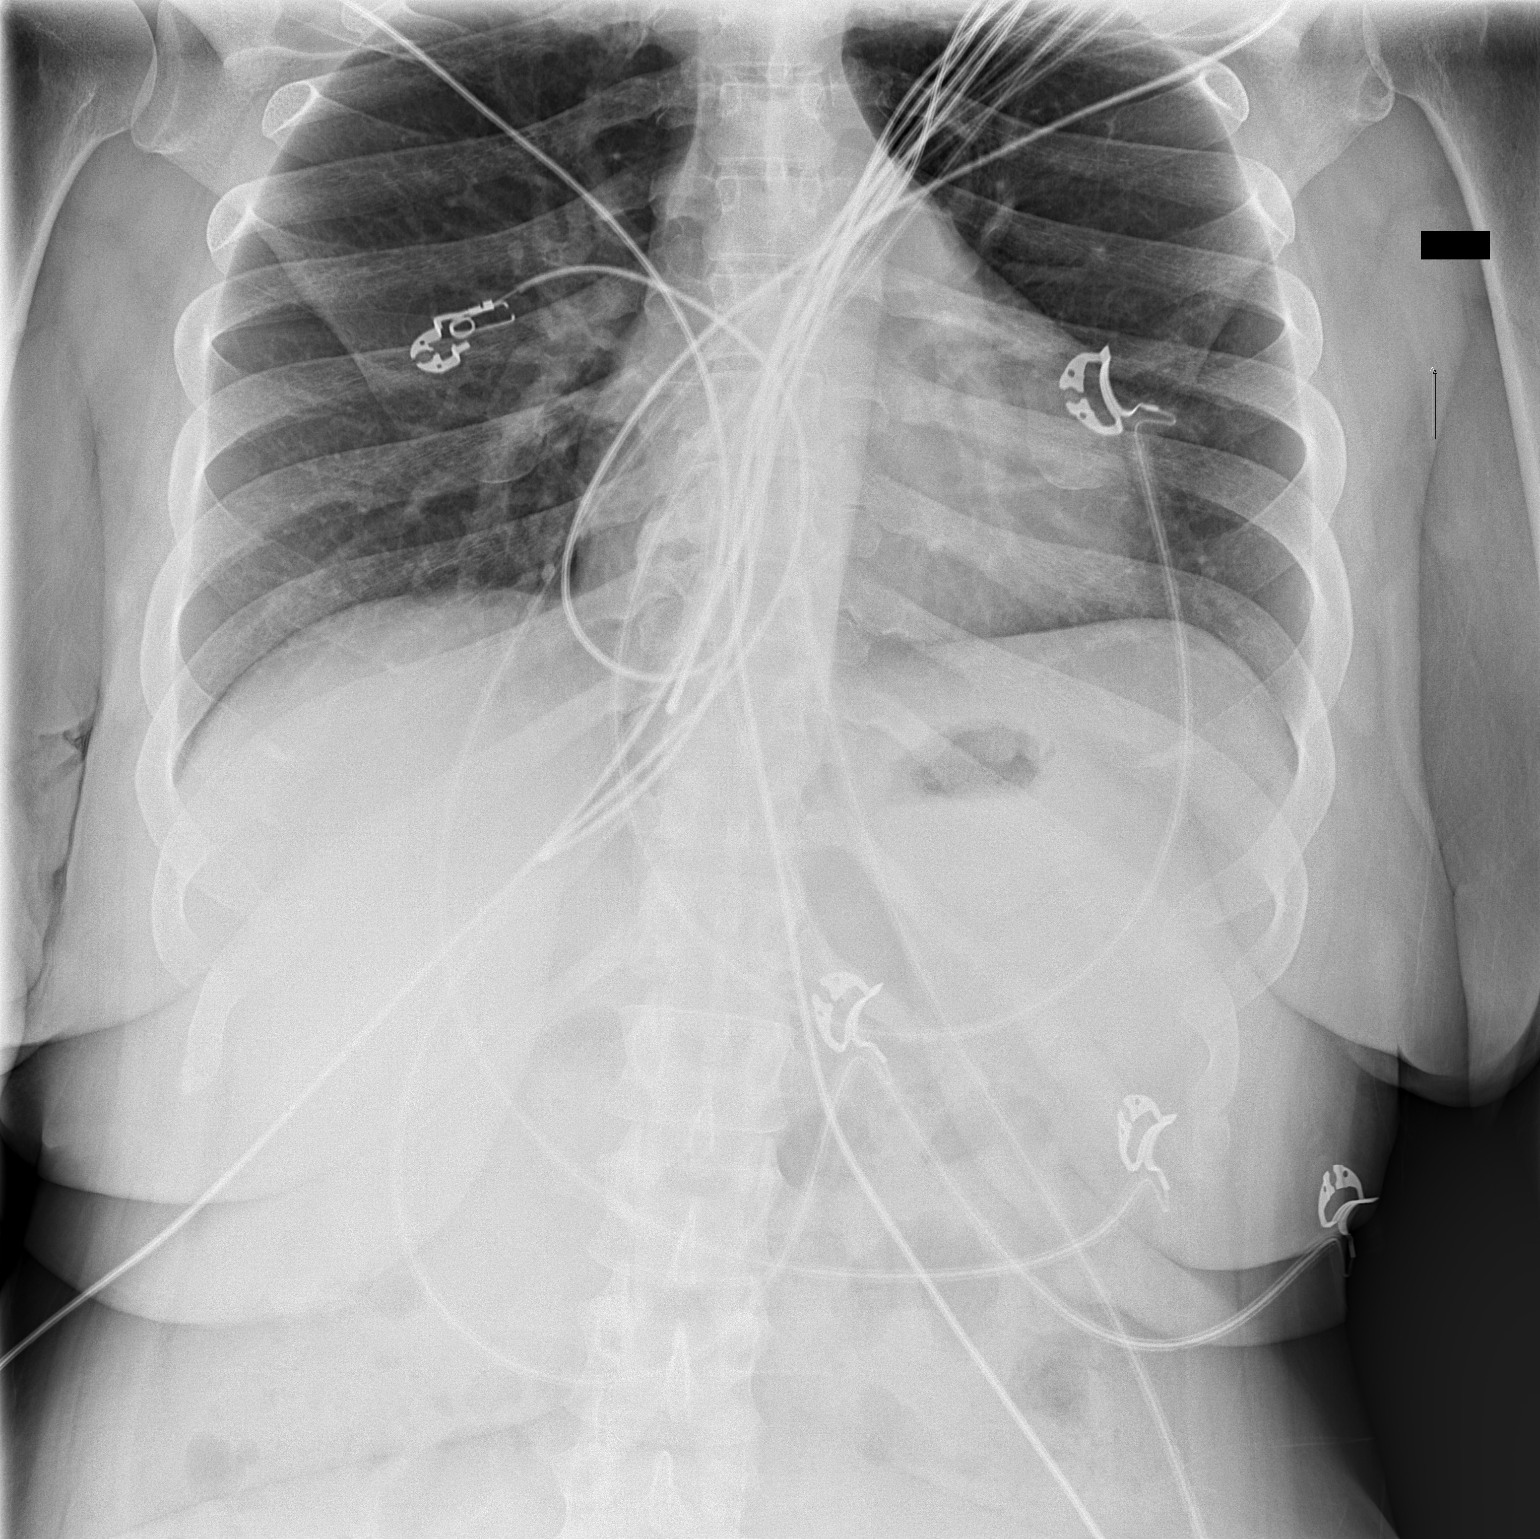

[t abdomen supine]
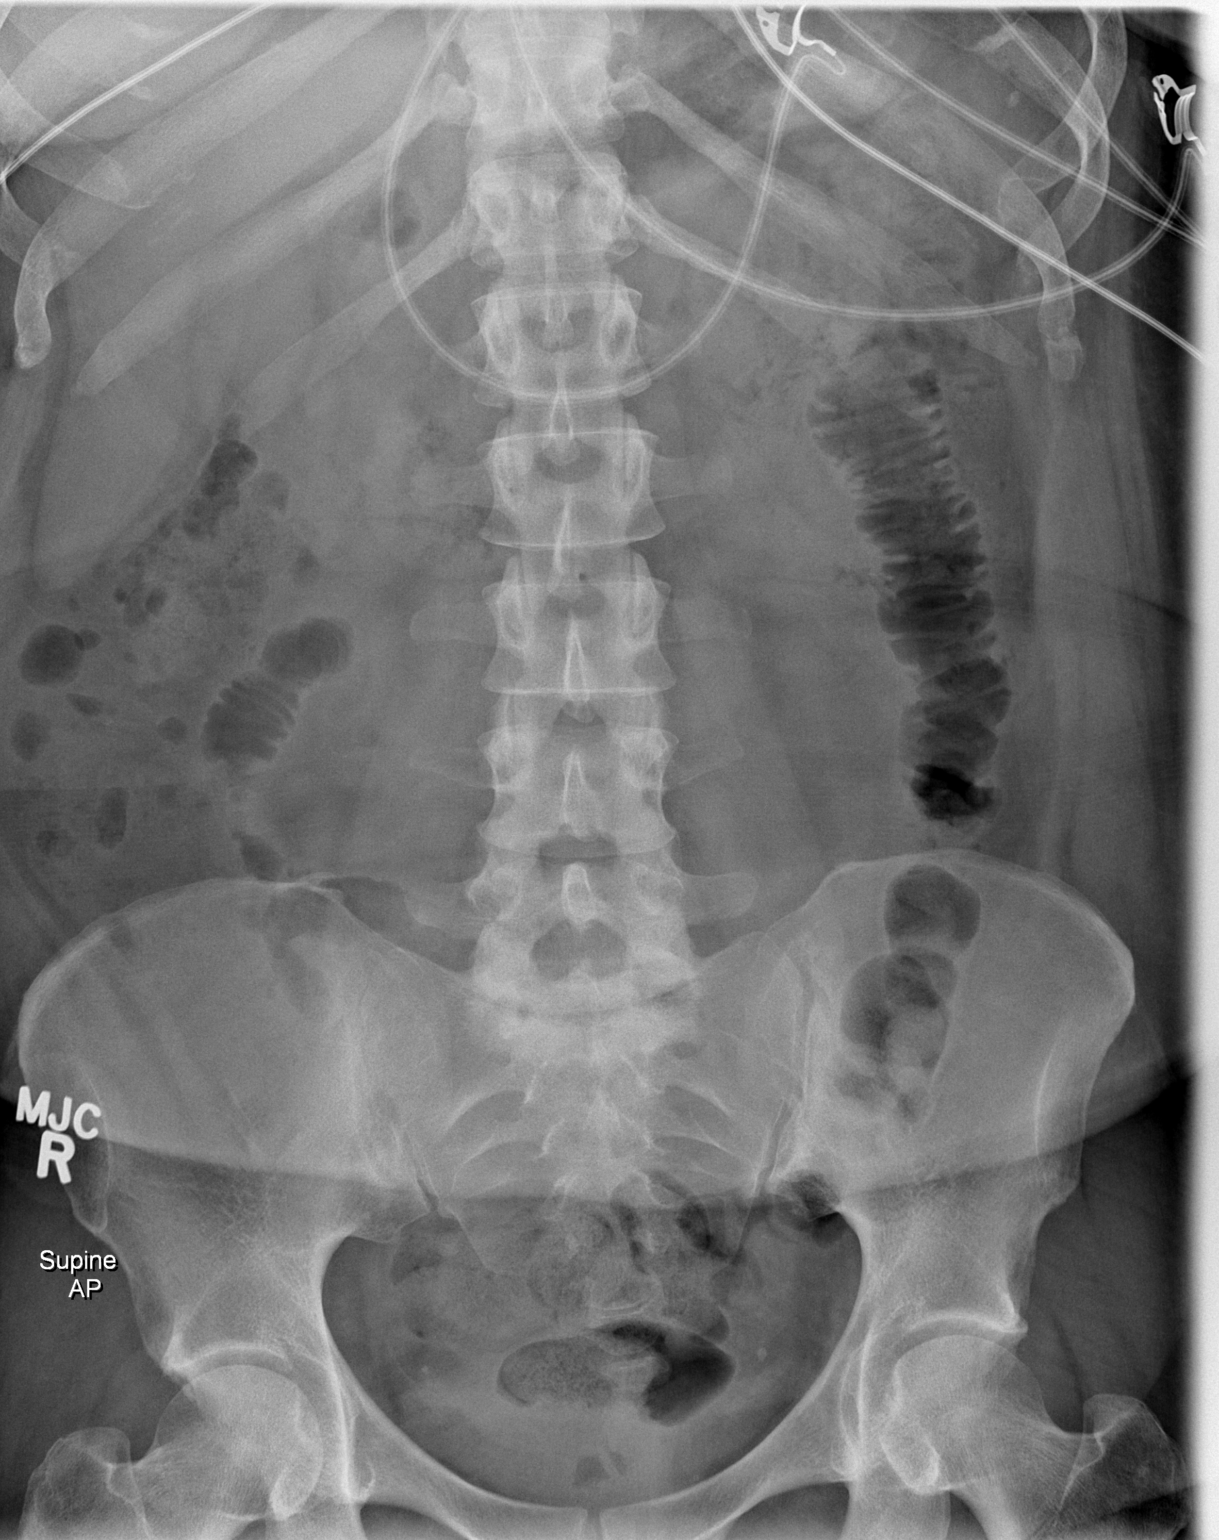

[2 of 2 positions shown; findings below may reference images not displayed]

FINDINGS: The bowel gas pattern is normal. There is no free intraperitoneal
air. Pelvic calcifications are stable. The osseous structures appear
unremarkable.
IMPRESSION: No acute abdominal pelvic findings.

## 2016-06-14 IMAGING — CR DG CHEST 1V PORT
1 series · 1 of 1 positions shown · non-contrast
Comparison: 09/29/2013 and 08/09/2013.

CLINICAL DATA: Chest pain.

EXAM:
PORTABLE CHEST - 1 VIEW

[AP]
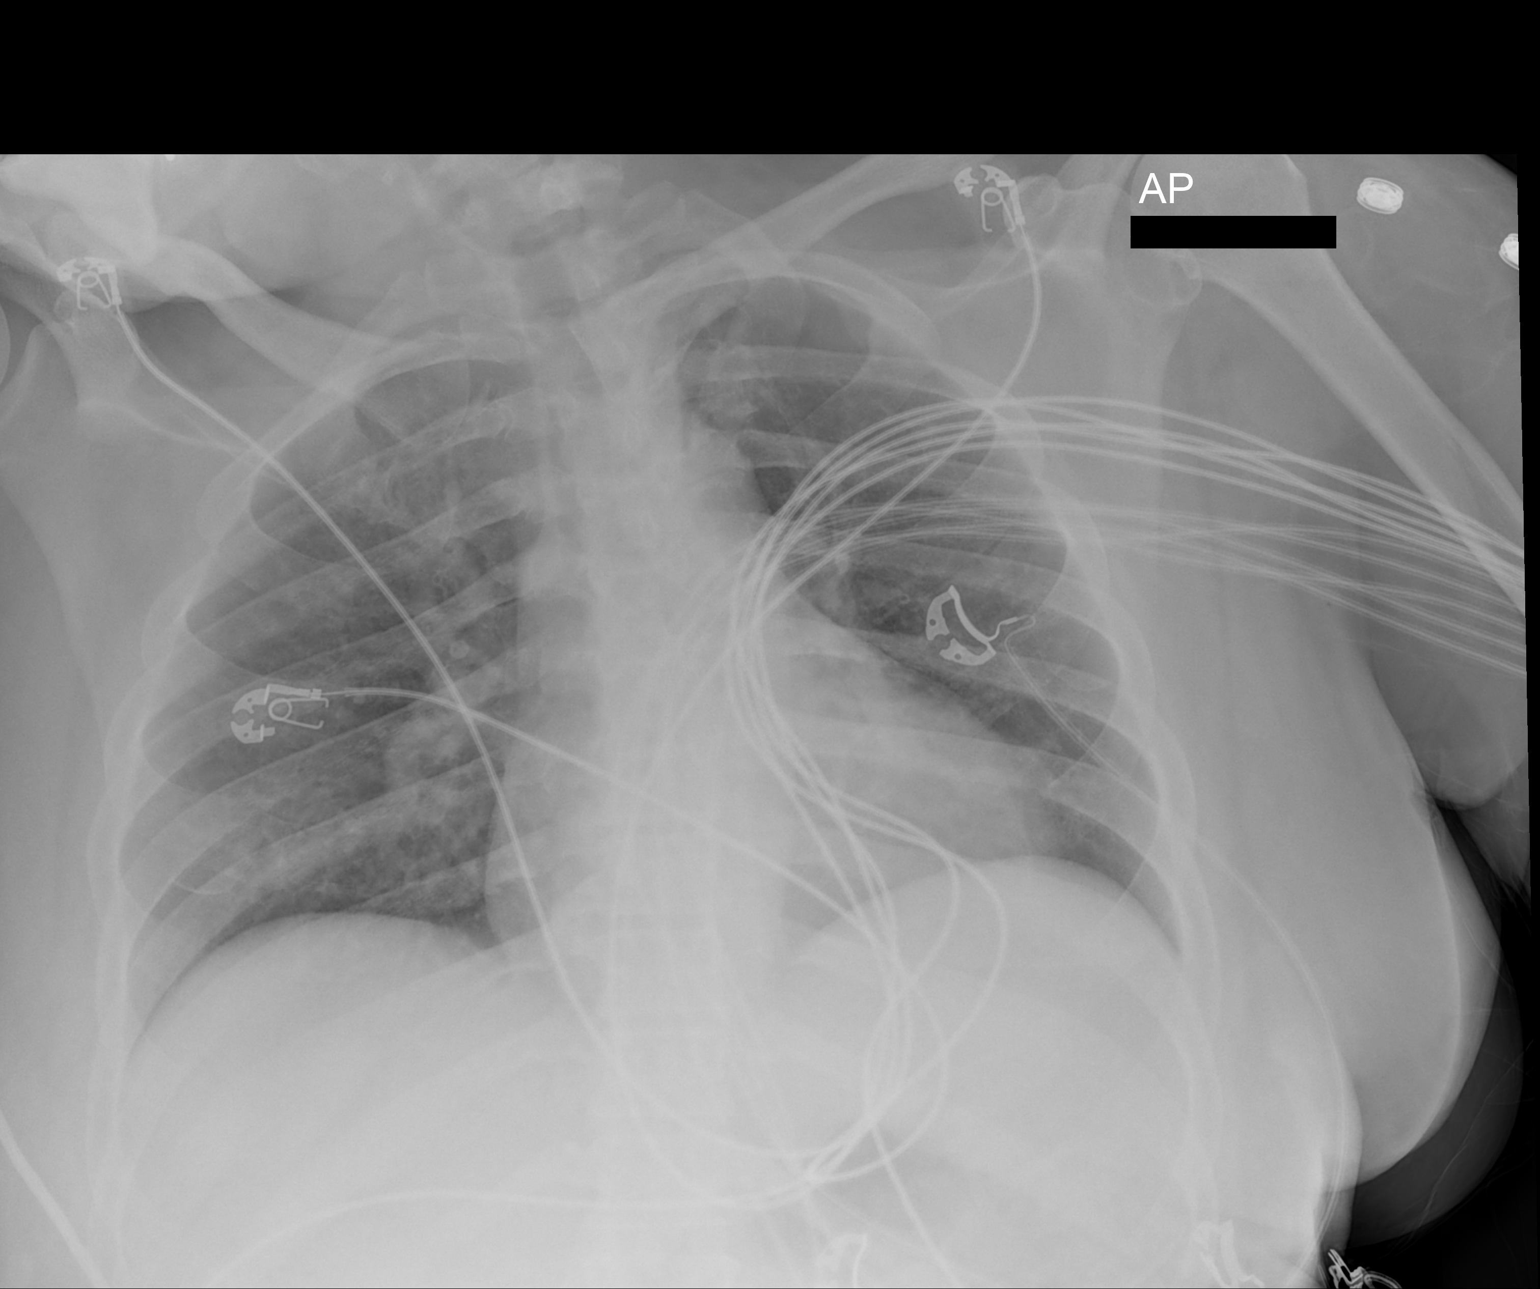

[1 of 1 positions shown; findings below may reference images not displayed]

FINDINGS: 1042 hr. There are lower lung volumes with mild patient rotation to
the right. Allowing for this, the heart size and mediastinal
contours are stable. The lungs are clear. There is no pleural
effusion or pneumothorax. Numerous telemetry leads overlie the
chest. No acute osseous findings are evident.
IMPRESSION: No active cardiopulmonary process.

## 2016-06-30 IMAGING — CR DG ABDOMEN ACUTE W/ 1V CHEST
4 series · 4 of 4 positions shown · non-contrast
Comparison: 11/09/2013 abdominal radiograph

CLINICAL DATA: Chest pain and abdominal pain

EXAM:
ACUTE ABDOMEN SERIES (ABDOMEN 2 VIEW & CHEST 1 VIEW)

[w abdomen decub]
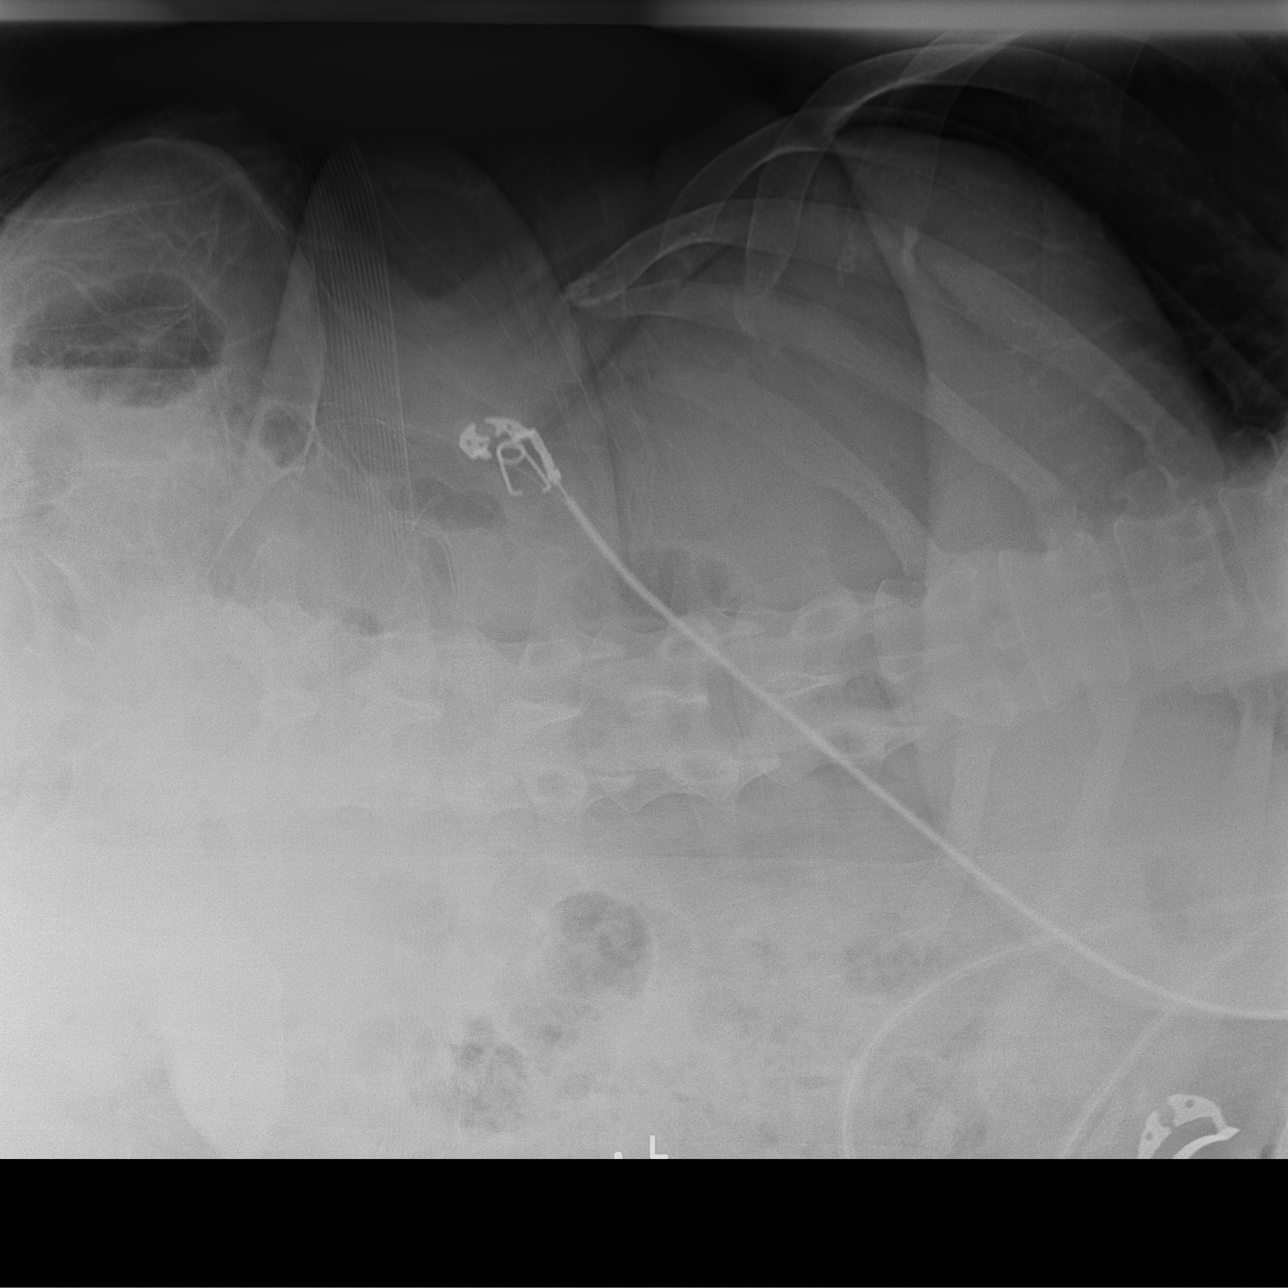

[x chest ap]
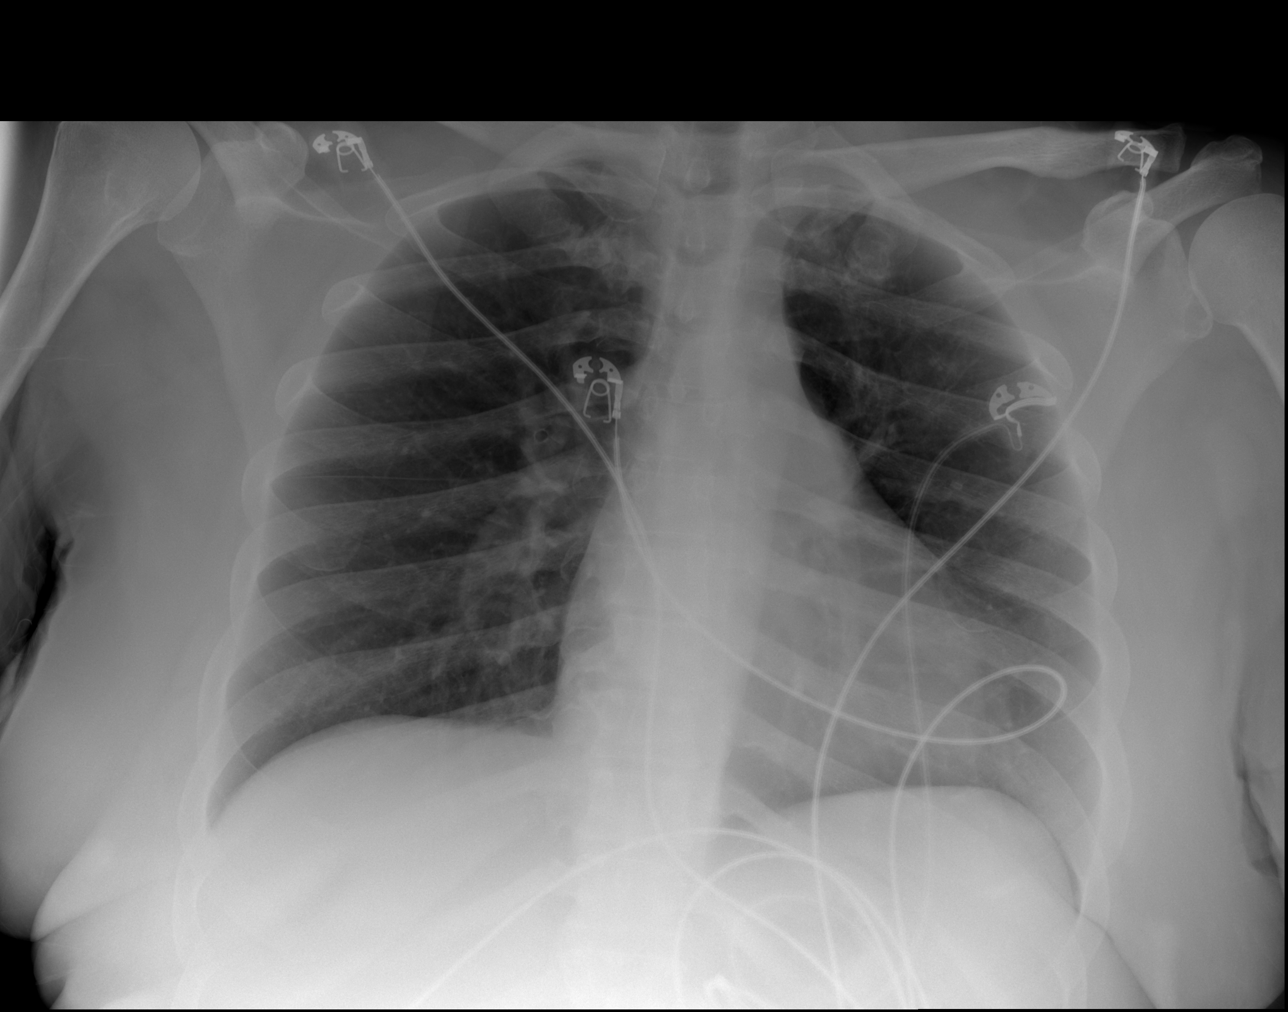

[x abdomen supine (1 of 2)]
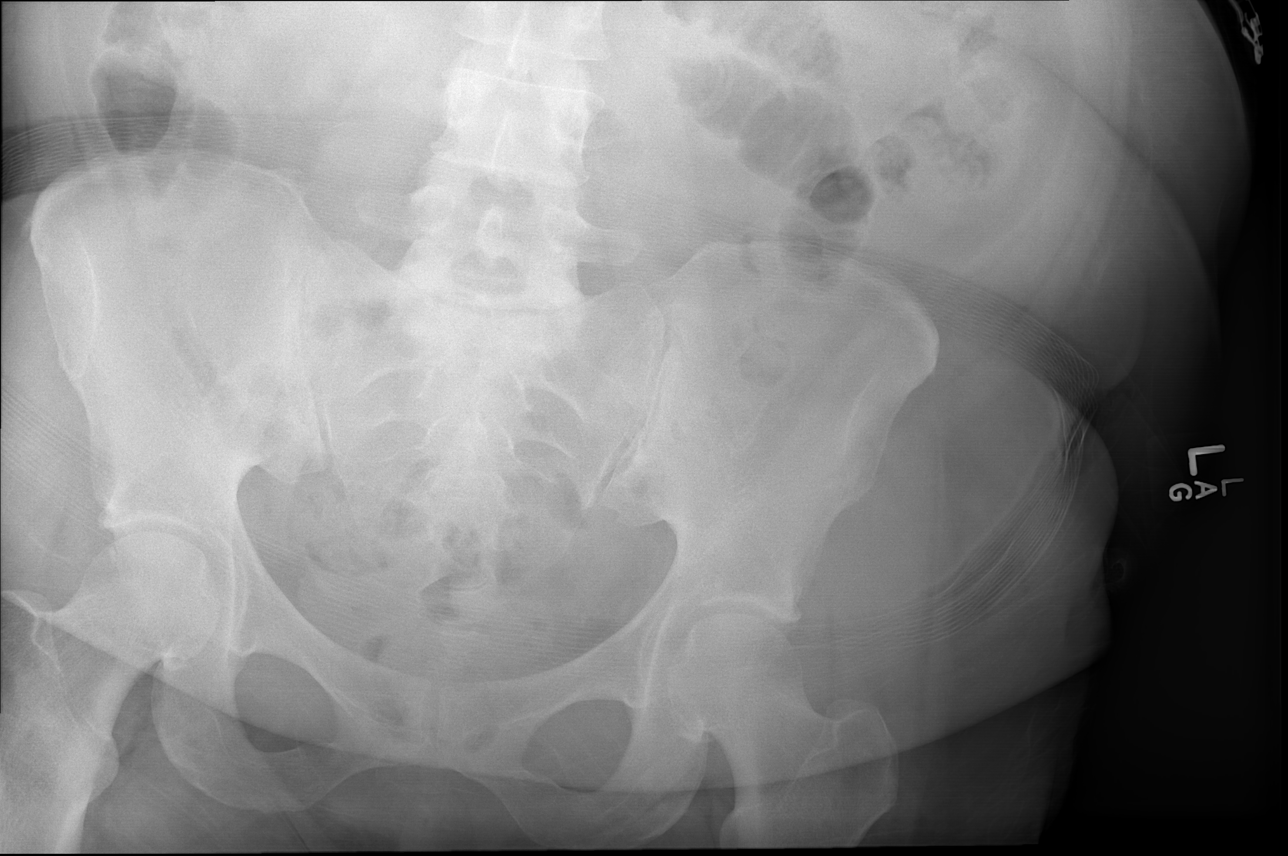

[x abdomen supine (2 of 2)]
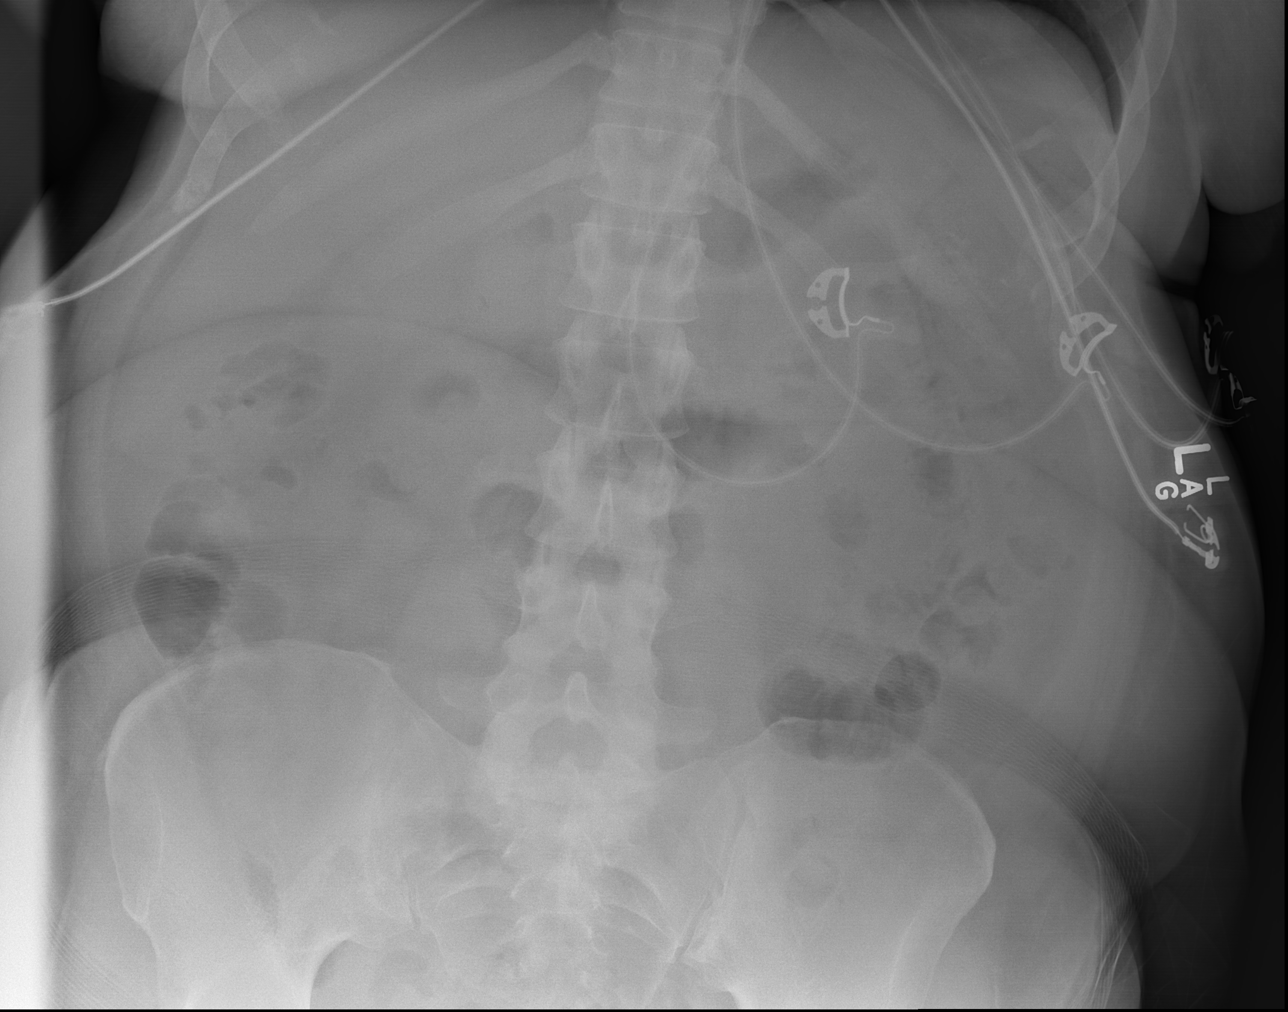

[4 of 4 positions shown; findings below may reference images not displayed]

FINDINGS: There is no evidence of dilated bowel loops or free intraperitoneal
air. No radiopaque calculi or other significant radiographic
abnormality is seen. Heart size and mediastinal contours are within
normal limits. Both lungs are clear.
IMPRESSION: Negative abdominal radiographs.  No acute cardiopulmonary disease.

## 2016-07-18 ENCOUNTER — Encounter (HOSPITAL_COMMUNITY): Payer: Self-pay | Admitting: Emergency Medicine

## 2016-07-18 ENCOUNTER — Emergency Department (HOSPITAL_COMMUNITY)
Admission: EM | Admit: 2016-07-18 | Discharge: 2016-07-18 | Disposition: A | Discharge status: Home / Self Care | Payer: Self-pay | Attending: Emergency Medicine | Admitting: Emergency Medicine

## 2016-07-18 DIAGNOSIS — Z79899 Other long term (current) drug therapy: Secondary | ICD-10-CM | POA: Insufficient documentation

## 2016-07-18 DIAGNOSIS — R109 Unspecified abdominal pain: Secondary | ICD-10-CM

## 2016-07-18 DIAGNOSIS — Z794 Long term (current) use of insulin: Secondary | ICD-10-CM | POA: Insufficient documentation

## 2016-07-18 DIAGNOSIS — Z87891 Personal history of nicotine dependence: Secondary | ICD-10-CM | POA: Insufficient documentation

## 2016-07-18 DIAGNOSIS — Z955 Presence of coronary angioplasty implant and graft: Secondary | ICD-10-CM | POA: Insufficient documentation

## 2016-07-18 DIAGNOSIS — Z8673 Personal history of transient ischemic attack (TIA), and cerebral infarction without residual deficits: Secondary | ICD-10-CM | POA: Insufficient documentation

## 2016-07-18 DIAGNOSIS — K3184 Gastroparesis: Secondary | ICD-10-CM | POA: Insufficient documentation

## 2016-07-18 DIAGNOSIS — I2510 Atherosclerotic heart disease of native coronary artery without angina pectoris: Secondary | ICD-10-CM | POA: Insufficient documentation

## 2016-07-18 DIAGNOSIS — I252 Old myocardial infarction: Secondary | ICD-10-CM | POA: Insufficient documentation

## 2016-07-18 DIAGNOSIS — E1142 Type 2 diabetes mellitus with diabetic polyneuropathy: Secondary | ICD-10-CM | POA: Insufficient documentation

## 2016-07-18 DIAGNOSIS — R112 Nausea with vomiting, unspecified: Secondary | ICD-10-CM

## 2016-07-18 DIAGNOSIS — I10 Essential (primary) hypertension: Secondary | ICD-10-CM | POA: Insufficient documentation

## 2016-07-18 LAB — CBC
HEMATOCRIT: 29.1 % — AB (ref 36.0–46.0)
HEMOGLOBIN: 9 g/dL — AB (ref 12.0–15.0)
MCH: 19.5 pg — AB (ref 26.0–34.0)
MCHC: 30.9 g/dL (ref 30.0–36.0)
MCV: 63.1 fL — ABNORMAL LOW (ref 78.0–100.0)
Platelets: 549 10*3/uL — ABNORMAL HIGH (ref 150–400)
RBC: 4.61 MIL/uL (ref 3.87–5.11)
RDW: 18.5 % — ABNORMAL HIGH (ref 11.5–15.5)
WBC: 14.6 10*3/uL — ABNORMAL HIGH (ref 4.0–10.5)

## 2016-07-18 LAB — COMPREHENSIVE METABOLIC PANEL
ALBUMIN: 3.3 g/dL — AB (ref 3.5–5.0)
ALT: 8 U/L — ABNORMAL LOW (ref 14–54)
ANION GAP: 11 (ref 5–15)
AST: 21 U/L (ref 15–41)
Alkaline Phosphatase: 57 U/L (ref 38–126)
BUN: 25 mg/dL — ABNORMAL HIGH (ref 6–20)
CO2: 22 mmol/L (ref 22–32)
Calcium: 9.1 mg/dL (ref 8.9–10.3)
Chloride: 104 mmol/L (ref 101–111)
Creatinine, Ser: 1.4 mg/dL — ABNORMAL HIGH (ref 0.44–1.00)
GFR calc non Af Amer: 46 mL/min — ABNORMAL LOW (ref >60)
GFR, EST AFRICAN AMERICAN: 53 mL/min — AB (ref >60)
GLUCOSE: 233 mg/dL — AB (ref 65–99)
POTASSIUM: 4.1 mmol/L (ref 3.5–5.1)
SODIUM: 137 mmol/L (ref 135–145)
Total Bilirubin: 0.7 mg/dL (ref 0.3–1.2)
Total Protein: 8.1 g/dL (ref 6.5–8.1)

## 2016-07-18 LAB — I-STAT TROPONIN, ED: Troponin i, poc: 0 ng/mL (ref 0.00–0.08)

## 2016-07-18 LAB — CBG MONITORING, ED: GLUCOSE-CAPILLARY: 198 mg/dL — AB (ref 65–99)

## 2016-07-18 LAB — I-STAT BETA HCG BLOOD, ED (MC, WL, AP ONLY): I-stat hCG, quantitative: <5 m[IU]/mL (ref <5)

## 2016-07-18 LAB — LIPASE, BLOOD: LIPASE: 25 U/L (ref 11–51)

## 2016-07-18 MED ORDER — ONDANSETRON 8 MG PO TBDP: 8.0000 mg | ORAL_TABLET | Freq: Three times a day (TID) | ORAL | 0 refills | Status: DC | PRN

## 2016-07-18 MED ORDER — HALOPERIDOL LACTATE 5 MG/ML IJ SOLN
5.0000 mg | Freq: Once | INTRAMUSCULAR | Status: AC
  Administered 2016-07-18: 5 mg via INTRAVENOUS
  Filled 2016-07-18: qty 1

## 2016-07-18 MED ORDER — SODIUM CHLORIDE 0.9 % IV BOLUS (SEPSIS)
1000.0000 mL | Freq: Once | INTRAVENOUS | Status: AC
  Administered 2016-07-18: 1000 mL via INTRAVENOUS

## 2016-07-18 MED ORDER — OXYCODONE-ACETAMINOPHEN 5-325 MG PO TABS
2.0000 | ORAL_TABLET | Freq: Once | ORAL | Status: AC
  Administered 2016-07-18: 2 via ORAL
  Filled 2016-07-18: qty 2

## 2016-07-18 MED ORDER — METOCLOPRAMIDE HCL 5 MG/ML IJ SOLN
10.0000 mg | Freq: Once | INTRAMUSCULAR | Status: AC
  Administered 2016-07-18: 10 mg via INTRAVENOUS
  Filled 2016-07-18: qty 2

## 2016-07-18 MED ORDER — GI COCKTAIL ~~LOC~~
30.0000 mL | Freq: Once | ORAL | Status: AC
  Administered 2016-07-18: 30 mL via ORAL
  Filled 2016-07-18: qty 30

## 2016-07-18 MED ORDER — PROMETHAZINE HCL 25 MG RE SUPP: 25.0000 mg | Freq: Four times a day (QID) | RECTAL | 0 refills | Status: DC | PRN

## 2016-07-18 MED ORDER — PROMETHAZINE HCL 25 MG/ML IJ SOLN
25.0000 mg | Freq: Once | INTRAMUSCULAR | Status: AC
  Administered 2016-07-18: 25 mg via INTRAVENOUS
  Filled 2016-07-18: qty 1

## 2016-07-18 NOTE — ED Notes (Signed)
Pt given sandwich bag and cranberry juice per Whitney(RN)

## 2016-07-18 NOTE — ED Notes (Signed)
Pt is back asleep.

## 2016-07-18 NOTE — Discharge Planning (Signed)
Chart reviewed. Spoke with pt significant other (pt too sleepy to wake up, gave permission to talk to significant other) concerning  medications and not being able to afford them.  Explained the [facility]- (Medication Assistance Through [facility]) program. S/O understands that the [facility] program is only a one time in one year from the date of discharge to next year. Pt also understands that there is a $3.00 co pay for each prescription.

## 2016-07-18 NOTE — ED Notes (Signed)
Pt has prescriptions and information about how to get diabetes medicine. No questions or concerns. Ambulated with steady gait; A&Ox3. Left with family.

## 2016-07-18 NOTE — ED Notes (Signed)
Pt reports still in pain 8/10 but nausea better.

## 2016-07-18 NOTE — ED Notes (Signed)
Pt to wait in hallway for case manager to help with diabetes meds; has no more meds or meter.

## 2016-07-18 NOTE — ED Notes (Signed)
Warm blanket given. No further needs

## 2016-07-18 NOTE — ED Notes (Signed)
Patient is resting comfortably. 

## 2016-07-18 NOTE — ED Triage Notes (Signed)
Pt to ER by private vehicle for evaluation of worsening LLQ abdominal pain than her usual that she experiences with gastroparesis. Reports acute pain x5 days with 3 days of constant nausea and vomiting. Also reports shortness of breath and chest pain that began 2 days ago as well. BP 97/59 at present with HR of 140. Pt is a/o x4.

## 2016-07-18 NOTE — Discharge Instructions (Signed)
Please see your primary care doctor for optimal care or your GI doctor.  Please return to the ER if your symptoms worsen; you have increased pain, fevers, chills, inability to keep any medications down, confusion. Otherwise see the outpatient doctor as requested.

## 2016-07-18 NOTE — ED Notes (Signed)
Pt asked to urinate x 2.

## 2016-07-18 NOTE — ED Provider Notes (Addendum)
[facility] DEPT Provider Note   CSN: [id number] Arrival date & time: 07/18/16  5284     History   Chief Complaint Chief Complaint  Patient presents with  . Abdominal Pain    HPI Shirley [name] is a 41 y.o. female.  HPI  41 y.o. female with history of diabetes mellitus type 2, CAD, hyperlipidemia, hypertension who presents to the ER because of persistent nausea vomiting with abdominal discomfort over the last 5 days. Pt reports that her pain is on the L side, worst over the LLQ. PT reports that her typical gastroparesis pain is L sided. Pt has associated nausea and emesis - with emesis x 5 times in the last 24 hours. Pt has no diarrhea. Pt denies any fevers, chills, pain with urination or blood in the urine. Pt has no vaginal discharge. Pt has no chest pain, dib.  Past Medical History:  Diagnosis Date  . Abscess of tunica vaginalis    10/09- Abundant S. aureus- sensitive to all abx  . Anxiety   . Blood dyscrasia   . CAD (coronary artery disease) 06/15/2006   s/p Subendocardial MI with PDA angioplasty(no stent) on 06/15/06 and relook  cath 06/19/06 showed patency of site. Cath 12/10- no restenosis or significant CAD progression  . CVA (cerebral vascular accident) ([place]) ~ 02/2014   denies residual on 04/22/2014  . CVA (cerebral vascular accident) [facility])    history of remote right cerebellar infarct noted on head CT at least since 10/2011  . Depression   . Diabetes mellitus type 2, uncontrolled, with complications ([place])   . Fibromyalgia   . Gastritis   . Gastroparesis    secondary to poorly controlled DM, last emptying study performed 01/2010  was normal but may be falsely positive as pt was on reglan  . GERD (gastroesophageal reflux disease)   . Hepatitis B, chronic (HCC)    Hep BeAb+,Hep B cAb+ & Hep BsAg+ (9/06)  . History of pyelonephritis    H/o GrpB Pyelonephritis (9/06) and UTI- 07/11- E.Coli, 12/10- GBS  . Hyperlipidemia   . Hypertension   . Iron deficiency anemia    . Irregular menses    Small ovarian follicles seen on XL(2/44)  . MI (myocardial infarction) 05/2006   PDA percutaneous transluminal coronary angioplasty  . Migraine    "weekly" (04/22/2014)  . N&V (nausea and vomiting)    Chronic. Unclear etiology with multiple admission and ED visits. CT abdomen with and without contrast (02/2011)  showed no acute process. Gastic Emptying scan (01/2010) was normal. Ultrasound of the abdomen was within normal limits. Hepatitis B viral load was undectable. HIV NR. EGD - gastritis, Hpylori + s/p Rx  . Obesity   . OSA (obstructive sleep apnea)    "suppose to wear mask but I don't" (04/22/2014)  . Peripheral neuropathy ([place])   . Pneumonia    "this is probably the 2nd or 3rd time I've had pneumonia" (04/22/2014)  . Recurrent boils   . Seasonal asthma   . Substance abuse   . Thrombocytosis ([place])    Hem/Onc suggested 2/2 chronic hepatits and/or iron deficiency anemia    Patient Active Problem List   Diagnosis Date Noted  . Type 2 diabetes mellitus with hyperglycemia ([place]) 04/07/2016  . Nausea & vomiting 04/07/2016  . Diabetic gastroparesis ([place]) 04/06/2016  . Non-intractable cyclical vomiting with nausea   . Dehydration 03/27/2015  . Intractable vomiting with nausea 11/11/2014  . Gastroparesis 11/11/2014  . Cough   . Hematemesis 10/13/2014  .  DKA (diabetic ketoacidoses) ([place]) 10/13/2014  . Increased anion gap metabolic acidosis 17/00/1749  . Tachycardia 07/21/2014  . Abdominal pain, chronic, left lower quadrant   . Nausea with vomiting   . Diabetic gastroparesis associated with type 2 diabetes mellitus ([place]) 04/28/2014  . History of Helicobacter pylori infection 04/22/2014  . Vaginal discharge 02/18/2014  . Atypical chest pain 07/22/2013  . Unspecified constipation 07/21/2013  . Tinea corporis 07/21/2013  . Intractable vomiting 04/29/2013  . Dysmenorrhea 04/22/2013  . Headache(784.0) 02/08/2012  . Health care maintenance 01/22/2012  .  Chronic hepatitis B ([place]) 03/07/2011  . History of leukocytosis 04/06/2010  . THROMBOCYTOSIS 04/06/2010  . Polysubstance abuse 02/23/2010  . Iron deficiency anemia 11/22/2009  . PERIPHERAL NEUROPATHY 10/01/2009  . Hyperlipidemia 08/30/2009  . POLYNEUROPATHY, DIABETIC 08/30/2009  . Hidradenitis (recurrent boils) 07/07/2008  . Depression 12/27/2007  . Abdominal pain, left lower quadrant 11/21/2007  . FIBROMYALGIA 10/30/2007  . BACK PAIN 04/01/2007  . OBSTRUCTIVE SLEEP APNEA 01/17/2007  . ANXIETY DEPRESSION 06/27/2006  . Chronic ischemic heart disease 06/15/2006  . OBESITY, MORBID 05/15/2006  . MIGRAINE HEADACHE 05/15/2006  . Asthma 05/15/2006  . Essential hypertension 01/16/2006  . IRREGULAR MENSTRUATION 01/16/2006  . PEDAL EDEMA 01/16/2006  . Poorly controlled type 2 diabetes mellitus with peripheral neuropathy ([place]) 01/16/1989    Past Surgical History:  Procedure Laterality Date  . CESAREAN SECTION  1997  . CORONARY ANGIOPLASTY WITH STENT PLACEMENT  2008   "2 stents"  . ESOPHAGOGASTRODUODENOSCOPY N/A 04/23/2014   Procedure: ESOPHAGOGASTRODUODENOSCOPY (EGD);  Surgeon: Winfield Cunas., MD;  Location: [facility] ENDOSCOPY;  Service: Endoscopy;  Laterality: N/A;    OB History    No data available       Home Medications    Prior to Admission medications   Medication Sig Start Date End Date Taking? Authorizing Provider  acetaminophen (TYLENOL) 500 MG tablet Take 500 mg by mouth every 6 (six) hours as needed.    Historical Provider, MD  albuterol (PROVENTIL HFA;VENTOLIN HFA) 108 (90 BASE) MCG/ACT inhaler Inhale 2 puffs into the lungs every 4 (four) hours as needed for wheezing or shortness of breath. Patient not taking: Reported on 05/29/2016 04/26/14   Alexa Angela Burke, MD  amLODipine (NORVASC) 10 MG tablet Take 1 tablet (10 mg total) by mouth daily. 05/29/16   Larene Pickett, PA-C  atorvastatin (LIPITOR) 80 MG tablet Take 1 tablet (80 mg total) by mouth daily. 05/29/16   Larene Pickett,  PA-C  ferrous sulfate 325 (65 FE) MG tablet Take 1 tablet (325 mg total) by mouth daily. 05/29/16   Larene Pickett, PA-C  insulin aspart (NOVOLOG) 100 UNIT/ML injection Inject 1-6 Units into the skin 3 (three) times daily before meals. 05/29/16   Larene Pickett, PA-C  insulin glargine (LANTUS) 100 unit/mL SOPN Inject 0.1 mLs (10 Units total) into the skin at bedtime. 05/29/16   Larene Pickett, PA-C  losartan (COZAAR) 25 MG tablet Take 1 tablet (25 mg total) by mouth daily. 05/29/16   Larene Pickett, PA-C  metoCLOPramide (REGLAN) 10 MG tablet Take 1 tablet (10 mg total) by mouth 4 (four) times daily -  before meals and at bedtime. Patient not taking: Reported on 05/29/2016 03/20/16   Reyne Dumas, MD  ondansetron (ZOFRAN ODT) 8 MG disintegrating tablet Take 1 tablet (8 mg total) by mouth every 8 (eight) hours as needed for nausea. 07/18/16   Varney Biles, MD  pantoprazole (PROTONIX) 40 MG tablet Take 1 tablet (40 mg total)  by mouth daily. 05/29/16   Larene Pickett, PA-C  phenazopyridine (PYRIDIUM) 200 MG tablet Take 1 tablet (200 mg total) by mouth 3 (three) times daily with meals. Patient not taking: Reported on 05/29/2016 03/20/16   Reyne Dumas, MD  promethazine (PHENERGAN) 25 MG suppository Place 1 suppository (25 mg total) rectally every 6 (six) hours as needed for nausea. 07/18/16   Varney Biles, MD  traMADol (ULTRAM) 50 MG tablet Take 1 tablet (50 mg total) by mouth every 6 (six) hours as needed. Patient not taking: Reported on 05/29/2016 03/05/16   Milton Ferguson, MD    Family History Family History  Problem Relation Age of Onset  . Diabetes Father     Social History Social History  Substance Use Topics  . Smoking status: Former Smoker    Types: Cigarettes    Quit date: 04/24/1996  . Smokeless tobacco: Never Used     Comment: quit smoking cigarettes age 67  . Alcohol use 0.0 oz/week     Comment: 04/22/2014 "might have a few drinks a month"     Allergies   Lisinopril and Morphine and  related   Review of Systems Review of Systems  Gastrointestinal: Positive for abdominal pain, nausea and vomiting.  Neurological: Positive for dizziness.     ROS 10 Systems reviewed and are negative for acute change except as noted in the HPI.     Physical Exam Updated Vital Signs BP 128/75   Pulse (!) 107   Temp 98.4 F (36.9 C) (Oral)   Resp 16   Ht 5\' 7"  (1.702 m)   Wt 201 lb (91.2 kg)   SpO2 99%   BMI 31.48 kg/m   Physical Exam  Constitutional: She is oriented to person, place, and time. She appears well-developed and well-nourished.  HENT:  Head: Normocephalic and atraumatic.  Right Ear: External ear normal.  Left Ear: External ear normal.  Mucus membrane is dry  Eyes: EOM are normal. Pupils are equal, round, and reactive to light.  Neck: Neck supple.  Cardiovascular: Regular rhythm and normal heart sounds.   No murmur heard. tachycarida  Pulmonary/Chest: Effort normal. No respiratory distress.  Abdominal: Soft. She exhibits no distension. There is no tenderness. There is no rebound and no guarding.  Neurological: She is alert and oriented to person, place, and time.  Skin: Skin is warm and dry.  Nursing note and vitals reviewed.    ED Treatments / Results  Labs (all labs ordered are listed, but only abnormal results are displayed) Labs Reviewed  COMPREHENSIVE METABOLIC PANEL - Abnormal; Notable for the following:       Result Value   Glucose, Bld 233 (*)    BUN 25 (*)    Creatinine, Ser 1.40 (*)    Albumin 3.3 (*)    ALT 8 (*)    GFR calc non Af Amer 46 (*)    GFR calc Af Amer 53 (*)    All other components within normal limits  CBC - Abnormal; Notable for the following:    WBC 14.6 (*)    Hemoglobin 9.0 (*)    HCT 29.1 (*)    MCV 63.1 (*)    MCH 19.5 (*)    RDW 18.5 (*)    Platelets 549 (*)    All other components within normal limits  CBG MONITORING, ED - Abnormal; Notable for the following:    Glucose-Capillary 198 (*)    All other  components within normal limits  LIPASE, BLOOD  URINALYSIS, ROUTINE W REFLEX MICROSCOPIC  I-STAT BETA HCG BLOOD, ED ([facility], [facility], [facility] ONLY)  I-STAT TROPOININ, ED    EKG  EKG Interpretation  Date/Time:  Tuesday July 18 2016 09:31:36 EDT Ventricular Rate:  137 PR Interval:  130 QRS Duration: 66 QT Interval:  302 QTC Calculation: 456 R Axis:   60 Text Interpretation:  Sinus tachycardia Biatrial enlargement Abnormal ECG No acute changes No significant change since last tracing Confirmed by Kathrynn Humble, MD, Thelma Comp ([id number]) on 07/18/2016 10:22:43 AM       Radiology No results found.  Procedures Procedures (including critical care time)  Medications Ordered in ED Medications  metoCLOPramide (REGLAN) injection 10 mg (10 mg Intravenous Given 07/18/16 1037)  promethazine (PHENERGAN) injection 25 mg (25 mg Intravenous Given 07/18/16 1037)  sodium chloride 0.9 % bolus 1,000 mL (0 mLs Intravenous Stopped 07/18/16 1138)  gi cocktail (Maalox,Lidocaine,Donnatal) (30 mLs Oral Given 07/18/16 1054)  haloperidol lactate (HALDOL) injection 5 mg (5 mg Intravenous Given 07/18/16 1053)  oxyCODONE-acetaminophen (PERCOCET/ROXICET) 5-325 MG per tablet 2 tablet (2 tablets Oral Given 07/18/16 1300)     Initial Impression / Assessment and Plan / ED Course  I have reviewed the triage vital signs and the nursing notes.  Pertinent labs & imaging results that were available during my care of the patient were reviewed by me and considered in my medical decision making (see chart for details).  Clinical Course as of Jul 19 1303  Tue Jul 18, 2016  1259 Pt reassessed. Pt's VSS and WNL. Pt's cap refill < 3 seconds. Pt has been hydrated in the ER and now passed po challenge. We will discharge with antiemetic. Strict ER return precautions have been discussed and pt will return if he is unable to tolerate fluids and symptoms are getting worse.  Pt still has abd pain - but she re-assures me that she gets L sided abd pain with  gastroparesis. On my repeat exam, the abd exam reveal soft abdomen, no guarding, tenderness over the entire L side and epigastrium.    [AN]  1302 Pt's low BP at arrival and tachycardia is due to dehydration and pain. Pt responded really well to iv fluids here. Her nausea now has resolved after promethazine and halodol. PO percocet given at the time of d.c. HR has improved to 70s- and 80s when pt is resting.  [AN]    Clinical Course User Index [AN] Varney Biles, MD    Pt comes in with cc of abd pain, nausea and emesis. She has hx of IDDM, Hepatitis, CAD and has had DKA and gastroparesis flair in the past. PT has pain on the Left side, LLQ is worse than LUQ - which is not typical of gastroparesis, but pt informs me that this is her typical flair up. Pt had tachycardia and low BP at arrival -but by the time I saw her, her BP was already normal. I doubt sepsis. We will get basic labs. R/o DKA. Hydrate for now.  EKg has no acute changes.  We will reassess patient, and if in severe pain, we will consider CT non contrast study, as we dont want to miss diverticulitis. UA ordered to eval for uti/pyelonephritis.  Final Clinical Impressions(s) / ED Diagnoses   Final diagnoses:  Gastroparesis  Acute abdominal pain  Non-intractable vomiting with nausea, unspecified vomiting type    New Prescriptions New Prescriptions   ONDANSETRON (ZOFRAN ODT) 8 MG DISINTEGRATING TABLET    Take 1 tablet (8 mg total) by mouth every 8 (  eight) hours as needed for nausea.   PROMETHAZINE (PHENERGAN) 25 MG SUPPOSITORY    Place 1 suppository (25 mg total) rectally every 6 (six) hours as needed for nausea.     Varney Biles, MD 07/18/16 [place], MD 07/18/16 5300

## 2016-07-27 ENCOUNTER — Encounter (HOSPITAL_COMMUNITY): Payer: Self-pay

## 2016-07-27 ENCOUNTER — Emergency Department (HOSPITAL_COMMUNITY)
Admission: EM | Admit: 2016-07-27 | Discharge: 2016-07-27 | Disposition: A | Discharge status: Home / Self Care | Payer: Self-pay | Attending: Emergency Medicine | Admitting: Emergency Medicine

## 2016-07-27 DIAGNOSIS — R112 Nausea with vomiting, unspecified: Secondary | ICD-10-CM

## 2016-07-27 DIAGNOSIS — R1084 Generalized abdominal pain: Secondary | ICD-10-CM

## 2016-07-27 DIAGNOSIS — Z955 Presence of coronary angioplasty implant and graft: Secondary | ICD-10-CM | POA: Insufficient documentation

## 2016-07-27 DIAGNOSIS — Z87891 Personal history of nicotine dependence: Secondary | ICD-10-CM | POA: Insufficient documentation

## 2016-07-27 DIAGNOSIS — I10 Essential (primary) hypertension: Secondary | ICD-10-CM | POA: Insufficient documentation

## 2016-07-27 DIAGNOSIS — I2510 Atherosclerotic heart disease of native coronary artery without angina pectoris: Secondary | ICD-10-CM | POA: Insufficient documentation

## 2016-07-27 DIAGNOSIS — E1140 Type 2 diabetes mellitus with diabetic neuropathy, unspecified: Secondary | ICD-10-CM | POA: Insufficient documentation

## 2016-07-27 DIAGNOSIS — Z8673 Personal history of transient ischemic attack (TIA), and cerebral infarction without residual deficits: Secondary | ICD-10-CM | POA: Insufficient documentation

## 2016-07-27 DIAGNOSIS — Z794 Long term (current) use of insulin: Secondary | ICD-10-CM | POA: Insufficient documentation

## 2016-07-27 DIAGNOSIS — J45909 Unspecified asthma, uncomplicated: Secondary | ICD-10-CM | POA: Insufficient documentation

## 2016-07-27 LAB — COMPREHENSIVE METABOLIC PANEL
ALK PHOS: 65 U/L (ref 38–126)
ALT: 9 U/L — AB (ref 14–54)
ANION GAP: 9 (ref 5–15)
AST: 19 U/L (ref 15–41)
Albumin: 3.6 g/dL (ref 3.5–5.0)
BUN: 14 mg/dL (ref 6–20)
CALCIUM: 9.2 mg/dL (ref 8.9–10.3)
CO2: 22 mmol/L (ref 22–32)
Chloride: 103 mmol/L (ref 101–111)
Creatinine, Ser: 1.18 mg/dL — ABNORMAL HIGH (ref 0.44–1.00)
GFR, EST NON AFRICAN AMERICAN: 56 mL/min — AB (ref >60)
Glucose, Bld: 210 mg/dL — ABNORMAL HIGH (ref 65–99)
Potassium: 3.8 mmol/L (ref 3.5–5.1)
Sodium: 134 mmol/L — ABNORMAL LOW (ref 135–145)
TOTAL PROTEIN: 8.4 g/dL — AB (ref 6.5–8.1)
Total Bilirubin: 0.3 mg/dL (ref 0.3–1.2)

## 2016-07-27 LAB — CBC
HCT: 28.7 % — ABNORMAL LOW (ref 36.0–46.0)
Hemoglobin: 8.9 g/dL — ABNORMAL LOW (ref 12.0–15.0)
MCH: 19.3 pg — ABNORMAL LOW (ref 26.0–34.0)
MCHC: 31 g/dL (ref 30.0–36.0)
MCV: 62.3 fL — ABNORMAL LOW (ref 78.0–100.0)
Platelets: 557 10*3/uL — ABNORMAL HIGH (ref 150–400)
RBC: 4.61 MIL/uL (ref 3.87–5.11)
RDW: 18.3 % — ABNORMAL HIGH (ref 11.5–15.5)
WBC: 15 10*3/uL — AB (ref 4.0–10.5)

## 2016-07-27 LAB — URINALYSIS, ROUTINE W REFLEX MICROSCOPIC
BILIRUBIN URINE: NEGATIVE
Ketones, ur: NEGATIVE mg/dL
Leukocytes, UA: NEGATIVE
NITRITE: NEGATIVE
PROTEIN: 30 mg/dL — AB
Specific Gravity, Urine: 1.011 (ref 1.005–1.030)
pH: 8 (ref 5.0–8.0)

## 2016-07-27 LAB — LIPASE, BLOOD: Lipase: 29 U/L (ref 11–51)

## 2016-07-27 MED ORDER — SODIUM CHLORIDE 0.9 % IV BOLUS (SEPSIS)
1000.0000 mL | Freq: Once | INTRAVENOUS | Status: AC
  Administered 2016-07-27: 1000 mL via INTRAVENOUS

## 2016-07-27 MED ORDER — LORAZEPAM 2 MG/ML IJ SOLN
0.5000 mg | Freq: Once | INTRAMUSCULAR | Status: AC
  Administered 2016-07-27: 0.5 mg via INTRAVENOUS
  Filled 2016-07-27: qty 1

## 2016-07-27 MED ORDER — HALOPERIDOL LACTATE 5 MG/ML IJ SOLN
2.0000 mg | Freq: Once | INTRAMUSCULAR | Status: AC
  Administered 2016-07-27: 2 mg via INTRAVENOUS
  Filled 2016-07-27: qty 1

## 2016-07-27 MED ORDER — METOCLOPRAMIDE HCL 10 MG PO TABS: 10.0000 mg | ORAL_TABLET | Freq: Four times a day (QID) | ORAL | 0 refills | Status: DC

## 2016-07-27 MED ORDER — METOCLOPRAMIDE HCL 5 MG/ML IJ SOLN
10.0000 mg | Freq: Once | INTRAMUSCULAR | Status: AC
  Administered 2016-07-27: 10 mg via INTRAVENOUS
  Filled 2016-07-27: qty 2

## 2016-07-27 NOTE — ED Notes (Signed)
Water given to drink 

## 2016-07-27 NOTE — ED Notes (Signed)
Upon arrival into room to get report patient was thrashing about on the bed. c/o lower abd. Pain , states she feels like she needs to urinate however can't.

## 2016-07-27 NOTE — ED Provider Notes (Signed)
[facility] DEPT Provider Note   CSN: [id number] Arrival date & time: 07/27/16  0011     History   Chief Complaint Chief Complaint  Patient presents with  . Emesis    HPI Shirley [name] is a 41 y.o. female.  The history is provided by the patient and medical records. No language interpreter was used.  Emesis   Associated symptoms include abdominal pain. Pertinent negatives include no chills, no cough, no diarrhea, no fever and no headaches.   Shirley [name] is a 41 y.o. female  with a PMH of gastroparesis, DM, GERD, fibromyalgia, CAD, HTN, HLD who presents to the Emergency Department complaining of lower abdominal pain which she describes as "just like my gastroparesis". Pain worst to left lower quadrant. Patient states pain began at 10 PM tonight and has been associated with nausea and multiple episodes of emesis. No diarrhea, fevers, back pain, chest pain, shortness of breath, urinary symptoms. Patient states that she has taken no medication prior to arrival for her symptoms. She additionally notes that she has no insurance and due to cost of medications has not gotten any of her medications filled including DM meds. Unsure of how sugars have been doing as she does not have glucometer at home.   Past Medical History:  Diagnosis Date  . Abscess of tunica vaginalis    10/09- Abundant S. aureus- sensitive to all abx  . Anxiety   . Blood dyscrasia   . CAD (coronary artery disease) 06/15/2006   s/p Subendocardial MI with PDA angioplasty(no stent) on 06/15/06 and relook  cath 06/19/06 showed patency of site. Cath 12/10- no restenosis or significant CAD progression  . CVA (cerebral vascular accident) ([place]) ~ 02/2014   denies residual on 04/22/2014  . CVA (cerebral vascular accident) [facility])    history of remote right cerebellar infarct noted on head CT at least since 10/2011  . Depression   . Diabetes mellitus type 2, uncontrolled, with complications ([place])   . Fibromyalgia   .  Gastritis   . Gastroparesis    secondary to poorly controlled DM, last emptying study performed 01/2010  was normal but may be falsely positive as pt was on reglan  . GERD (gastroesophageal reflux disease)   . Hepatitis B, chronic (HCC)    Hep BeAb+,Hep B cAb+ & Hep BsAg+ (9/06)  . History of pyelonephritis    H/o GrpB Pyelonephritis (9/06) and UTI- 07/11- E.Coli, 12/10- GBS  . Hyperlipidemia   . Hypertension   . Iron deficiency anemia   . Irregular menses    Small ovarian follicles seen on PJ(8/25)  . MI (myocardial infarction) 05/2006   PDA percutaneous transluminal coronary angioplasty  . Migraine    "weekly" (04/22/2014)  . N&V (nausea and vomiting)    Chronic. Unclear etiology with multiple admission and ED visits. CT abdomen with and without contrast (02/2011)  showed no acute process. Gastic Emptying scan (01/2010) was normal. Ultrasound of the abdomen was within normal limits. Hepatitis B viral load was undectable. HIV NR. EGD - gastritis, Hpylori + s/p Rx  . Obesity   . OSA (obstructive sleep apnea)    "suppose to wear mask but I don't" (04/22/2014)  . Peripheral neuropathy ([place])   . Pneumonia    "this is probably the 2nd or 3rd time I've had pneumonia" (04/22/2014)  . Recurrent boils   . Seasonal asthma   . Substance abuse   . Thrombocytosis ([place])    Hem/Onc suggested 2/2 chronic hepatits and/or iron  deficiency anemia    Patient Active Problem List   Diagnosis Date Noted  . Type 2 diabetes mellitus with hyperglycemia ([place]) 04/07/2016  . Nausea & vomiting 04/07/2016  . Diabetic gastroparesis ([place]) 04/06/2016  . Non-intractable cyclical vomiting with nausea   . Dehydration 03/27/2015  . Intractable vomiting with nausea 11/11/2014  . Gastroparesis 11/11/2014  . Cough   . Hematemesis 10/13/2014  . DKA (diabetic ketoacidoses) ([place]) 10/13/2014  . Increased anion gap metabolic acidosis 54/00/8676  . Tachycardia 07/21/2014  . Abdominal pain, chronic, left lower  quadrant   . Nausea with vomiting   . Diabetic gastroparesis associated with type 2 diabetes mellitus ([place]) 04/28/2014  . History of Helicobacter pylori infection 04/22/2014  . Vaginal discharge 02/18/2014  . Atypical chest pain 07/22/2013  . Unspecified constipation 07/21/2013  . Tinea corporis 07/21/2013  . Intractable vomiting 04/29/2013  . Dysmenorrhea 04/22/2013  . Headache(784.0) 02/08/2012  . Health care maintenance 01/22/2012  . Chronic hepatitis B ([place]) 03/07/2011  . History of leukocytosis 04/06/2010  . THROMBOCYTOSIS 04/06/2010  . Polysubstance abuse 02/23/2010  . Iron deficiency anemia 11/22/2009  . PERIPHERAL NEUROPATHY 10/01/2009  . Hyperlipidemia 08/30/2009  . POLYNEUROPATHY, DIABETIC 08/30/2009  . Hidradenitis (recurrent boils) 07/07/2008  . Depression 12/27/2007  . Abdominal pain, left lower quadrant 11/21/2007  . FIBROMYALGIA 10/30/2007  . BACK PAIN 04/01/2007  . OBSTRUCTIVE SLEEP APNEA 01/17/2007  . ANXIETY DEPRESSION 06/27/2006  . Chronic ischemic heart disease 06/15/2006  . OBESITY, MORBID 05/15/2006  . MIGRAINE HEADACHE 05/15/2006  . Asthma 05/15/2006  . Essential hypertension 01/16/2006  . IRREGULAR MENSTRUATION 01/16/2006  . PEDAL EDEMA 01/16/2006  . Poorly controlled type 2 diabetes mellitus with peripheral neuropathy ([place]) 01/16/1989    Past Surgical History:  Procedure Laterality Date  . CESAREAN SECTION  1997  . CORONARY ANGIOPLASTY WITH STENT PLACEMENT  2008   "2 stents"  . ESOPHAGOGASTRODUODENOSCOPY N/A 04/23/2014   Procedure: ESOPHAGOGASTRODUODENOSCOPY (EGD);  Surgeon: Winfield Cunas., MD;  Location: [facility] ENDOSCOPY;  Service: Endoscopy;  Laterality: N/A;    OB History    No data available       Home Medications    Prior to Admission medications   Medication Sig Start Date End Date Taking? Authorizing Provider  acetaminophen (TYLENOL) 500 MG tablet Take 500 mg by mouth every 6 (six) hours as needed.   Yes Historical Provider, MD    amLODipine (NORVASC) 10 MG tablet Take 1 tablet (10 mg total) by mouth daily. 05/29/16  Yes Larene Pickett, PA-C  atorvastatin (LIPITOR) 80 MG tablet Take 1 tablet (80 mg total) by mouth daily. 05/29/16  Yes Larene Pickett, PA-C  ferrous sulfate 325 (65 FE) MG tablet Take 1 tablet (325 mg total) by mouth daily. 05/29/16  Yes Larene Pickett, PA-C  insulin aspart (NOVOLOG) 100 UNIT/ML injection Inject 1-6 Units into the skin 3 (three) times daily before meals. 05/29/16  Yes Larene Pickett, PA-C  insulin glargine (LANTUS) 100 unit/mL SOPN Inject 0.1 mLs (10 Units total) into the skin at bedtime. 05/29/16  Yes Larene Pickett, PA-C  losartan (COZAAR) 25 MG tablet Take 1 tablet (25 mg total) by mouth daily. 05/29/16  Yes Larene Pickett, PA-C  ondansetron (ZOFRAN ODT) 8 MG disintegrating tablet Take 1 tablet (8 mg total) by mouth every 8 (eight) hours as needed for nausea. 07/18/16  Yes Varney Biles, MD  pantoprazole (PROTONIX) 40 MG tablet Take 1 tablet (40 mg total) by mouth daily. 05/29/16  Yes Vilinda Blanks  Baird Cancer, PA-C  promethazine (PHENERGAN) 25 MG suppository Place 1 suppository (25 mg total) rectally every 6 (six) hours as needed for nausea. 07/18/16  Yes Varney Biles, MD  albuterol (PROVENTIL HFA;VENTOLIN HFA) 108 (90 BASE) MCG/ACT inhaler Inhale 2 puffs into the lungs every 4 (four) hours as needed for wheezing or shortness of breath. Patient not taking: Reported on 05/29/2016 04/26/14   Alexa Angela Burke, MD  metoCLOPramide (REGLAN) 10 MG tablet Take 1 tablet (10 mg total) by mouth every 6 (six) hours. 07/27/16   Ozella Almond Braydee Shimkus, PA-C  phenazopyridine (PYRIDIUM) 200 MG tablet Take 1 tablet (200 mg total) by mouth 3 (three) times daily with meals. Patient not taking: Reported on 05/29/2016 03/20/16   Reyne Dumas, MD  traMADol (ULTRAM) 50 MG tablet Take 1 tablet (50 mg total) by mouth every 6 (six) hours as needed. Patient not taking: Reported on 05/29/2016 03/05/16   [place] Ferguson, MD    Family History Family History   Problem Relation Age of Onset  . Diabetes Father     Social History Social History  Substance Use Topics  . Smoking status: Former Smoker    Types: Cigarettes    Quit date: 04/24/1996  . Smokeless tobacco: Never Used     Comment: quit smoking cigarettes age 76  . Alcohol use 0.0 oz/week     Comment: 04/22/2014 "might have a few drinks a month"     Allergies   Lisinopril and Morphine and related   Review of Systems Review of Systems  Constitutional: Negative for chills and fever.  HENT: Negative for congestion.   Eyes: Negative for visual disturbance.  Respiratory: Negative for cough and shortness of breath.   Cardiovascular: Negative for chest pain.  Gastrointestinal: Positive for abdominal pain, nausea and vomiting. Negative for diarrhea.  Genitourinary: Negative for dysuria.  Musculoskeletal: Negative for back pain and neck pain.  Skin: Negative for rash.  Neurological: Negative for headaches.     Physical Exam Updated Vital Signs BP 132/77 (BP Location: Right Arm)   Pulse 96   Temp 98 F (36.7 C)   Resp 15   SpO2 98%   Physical Exam  Constitutional: She is oriented to person, place, and time. She appears well-developed and well-nourished. No distress.  HENT:  Head: Normocephalic and atraumatic.  Neck: Neck supple.  Cardiovascular: Normal heart sounds.   No murmur heard. Tachycardic but regular.   Pulmonary/Chest: Effort normal and breath sounds normal. No respiratory distress. She has no wheezes. She has no rales.  Abdominal: Soft. She exhibits no distension. There is tenderness (Lower abdomen, left > right).  Neurological: She is alert and oriented to person, place, and time.  Skin: Skin is warm and dry.  Nursing note and vitals reviewed.    ED Treatments / Results  Labs (all labs ordered are listed, but only abnormal results are displayed) Labs Reviewed  COMPREHENSIVE METABOLIC PANEL - Abnormal; Notable for the following:       Result Value    Sodium 134 (*)    Glucose, Bld 210 (*)    Creatinine, Ser 1.18 (*)    Total Protein 8.4 (*)    ALT 9 (*)    GFR calc non Af Amer 56 (*)    All other components within normal limits  CBC - Abnormal; Notable for the following:    WBC 15.0 (*)    Hemoglobin 8.9 (*)    HCT 28.7 (*)    MCV 62.3 (*)    MCH 19.3 (*)  RDW 18.3 (*)    Platelets 557 (*)    All other components within normal limits  URINALYSIS, ROUTINE W REFLEX MICROSCOPIC - Abnormal; Notable for the following:    APPearance CLOUDY (*)    Glucose, UA >=500 (*)    Hgb urine dipstick SMALL (*)    Protein, ur 30 (*)    Bacteria, UA MANY (*)    Squamous Epithelial / LPF 6-30 (*)    All other components within normal limits  LIPASE, BLOOD    EKG  EKG Interpretation  Date/Time:  Thursday July 27 2016 03:16:37 EDT Ventricular Rate:  111 PR Interval:    QRS Duration: 80 QT Interval:  325 QTC Calculation: 442 R Axis:   70 Text Interpretation:  Sinus tachycardia Atrial premature complex Baseline wander in lead(s) V2 Otherwise within normal limits Confirmed by POLLINA  MD, CHRISTOPHER ([id number]) on 07/27/2016 3:22:10 AM       Radiology No results found.  Procedures Procedures (including critical care time)  Medications Ordered in ED Medications  sodium chloride 0.9 % bolus 1,000 mL (1,000 mLs Intravenous New Bag/Given 07/27/16 0309)  haloperidol lactate (HALDOL) injection 2 mg (2 mg Intravenous Given 07/27/16 0309)  LORazepam (ATIVAN) injection 0.5 mg (0.5 mg Intravenous Given 07/27/16 0309)  metoCLOPramide (REGLAN) injection 10 mg (10 mg Intravenous Given 07/27/16 0513)     Initial Impression / Assessment and Plan / ED Course  I have reviewed the triage vital signs and the nursing notes.  Pertinent labs & imaging results that were available during my care of the patient were reviewed by me and considered in my medical decision making (see chart for details).    Shirley [name] is a 41 y.o. female who presents to ED  for lower abdominal pain which began around 10 PM tonight. Per patient, today's sxs feel similar to episodes of gastroparesis in the past.  Most recently seen on 3/27 for similar. On exam, patient looks dehydrated and is tachycardic- IV fluids given. EKG sinus tach. Labs reviewed and notable for leukocytosis of 15.0. Creatinine of 1.18. Stable anemia. UA does not appear infectious. No signs of DKA. Patient given fluids, ativan, haldol and reglan. On re-evaluation, no focal areas of abdominal tenderness, no peritoneal signs. Patient states she had large bowel movement and feels much better. She is tolerating PO in ED with no episodes of emesis since medication administration. HR responded well to fluids. Evaluation does not show pathology that would require ongoing emergent intervention or inpatient treatment. Strongly encouraged patient to follow up with PCP - encouraged her to call PCP today to schedule appointment. Return precautions including inability to keep down fluids, focal area of abdominal pain occurring, pain intensifies, fever develops, blood in the stool, new/concerning symptoms. Patient states that she feels comfortable with discharge to home. Rx for Reglan given. All questions answered.   Patient discussed with Dr. Betsey Holiday who agrees with treatment plan.   Final Clinical Impressions(s) / ED Diagnoses   Final diagnoses:  Non-intractable vomiting with nausea, unspecified vomiting type  Generalized abdominal pain    New Prescriptions New Prescriptions   METOCLOPRAMIDE (REGLAN) 10 MG TABLET    Take 1 tablet (10 mg total) by mouth every 6 (six) hours.     [facility] Tanique Matney, PA-C 07/27/16 [place], MD 07/28/16 [phone]

## 2016-07-27 NOTE — ED Triage Notes (Signed)
Pt comes with c/o of vomiting x 6 . Actively vomiting in triage. Pt states that it started about two hours ago. Denies diarrhea, reports central abd pain. Denies dysuria.

## 2016-07-27 NOTE — ED Notes (Signed)
Needing to use the bedpan unable to do so on the stretcher , BSC given and patient assisted up to [facility].

## 2016-07-27 NOTE — ED Triage Notes (Signed)
Pt reports that she has been out of her BP medication for the past month due to no insurance.

## 2016-07-27 NOTE — ED Notes (Signed)
Patient much calmer , resting on stretcher.

## 2016-07-27 NOTE — ED Notes (Signed)
States she is feeling a [place] better after having a bowel movement.

## 2016-07-27 NOTE — Discharge Instructions (Signed)
Reglan as needed for nausea. This medication should be $4 at [facility].  Please call your primary care provider to schedule a follow up appointment.  Please seek immediate care if you develop any of the following symptoms: The pain does not go away.  You have a fever.  You keep throwing up and can't keep fluids down. You pass bloody or black tarry stools.  There is bright red blood in the stool.  You do not seem to be getting better.  You have any questions or concerns.

## 2016-07-27 NOTE — ED Notes (Signed)
Pt unable to void at this time, 

## 2016-08-05 IMAGING — CT CT ABD-PELV W/ CM
1 of 2 series · 15 of 32 positions shown, 19 images · IV contrast (omnipaque)
Comparison: 04/11/2013.

CLINICAL DATA: Low abdominal pain for 2 years. Nausea and vomiting.
Constipation. History of hepatitis-B.

EXAM:
CT ABDOMEN AND PELVIS WITH CONTRAST
TECHNIQUE: Multidetector CT imaging of the abdomen and pelvis was performed
using the standard protocol following bolus administration of
intravenous contrast.
CONTRAST:  100mL OMNIPAQUE IOHEXOL 300 MG/ML  SOLN

[Series 2: abd/pel with · axial · 0.79mm/px · z∈[-521,-71]mm · 15 of 98 slices shown, 19 images]
[im 4/98  soft-tissue]
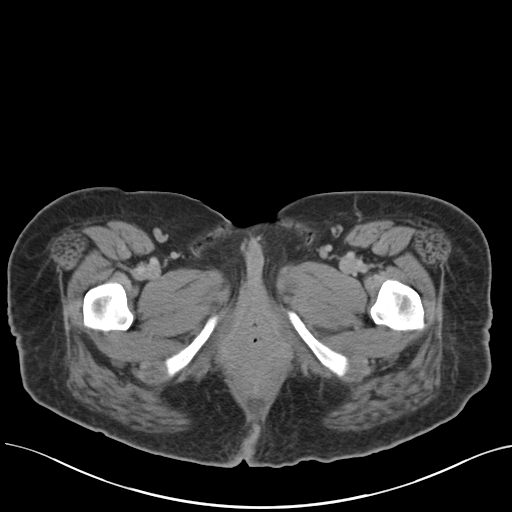
[im 4/98  bone]
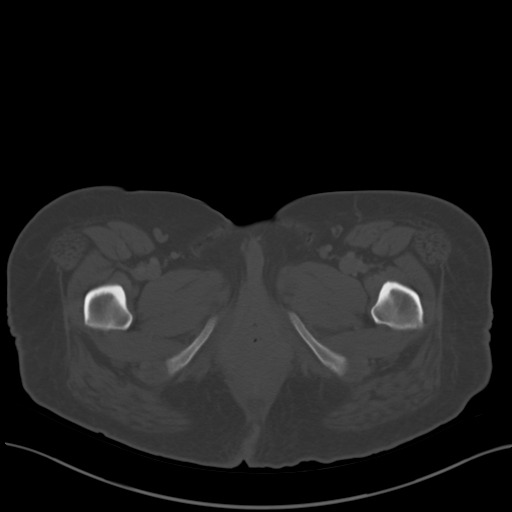
[im 12/98  soft-tissue]
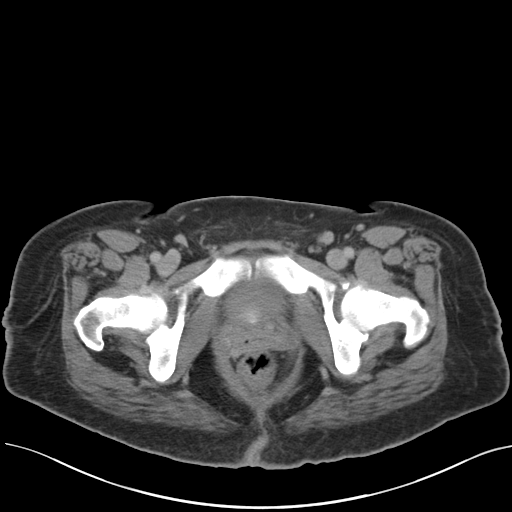
[im 20/98  soft-tissue]
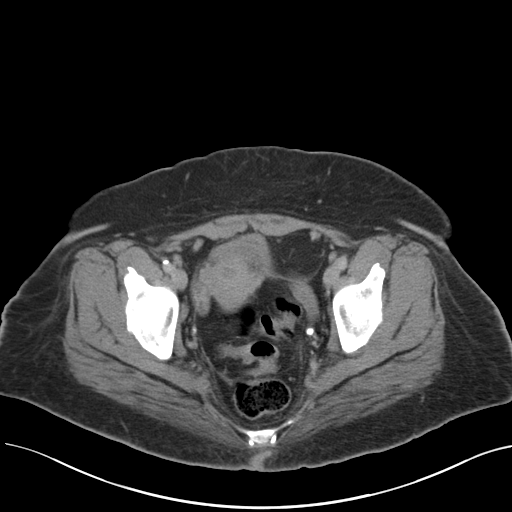
[im 28/98  soft-tissue]
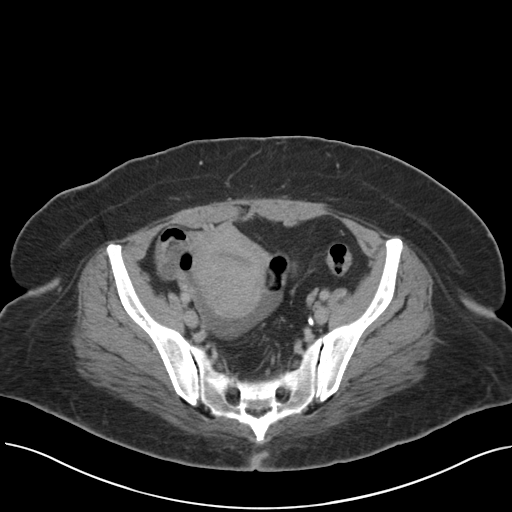
[im 35/98  soft-tissue]
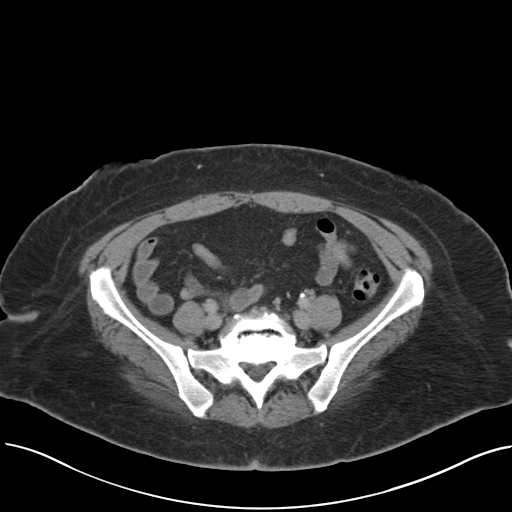
[im 43/98  soft-tissue]
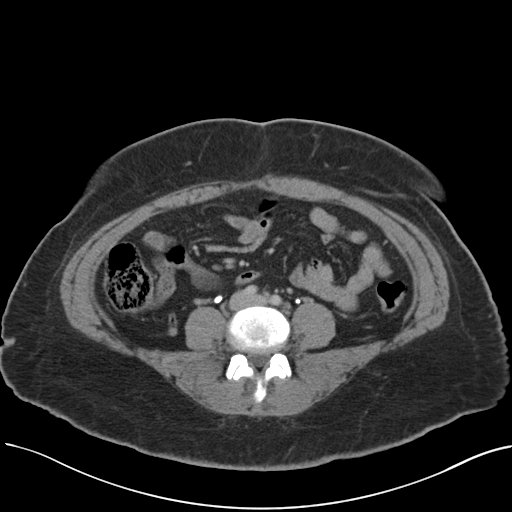
[im 51/98  soft-tissue]
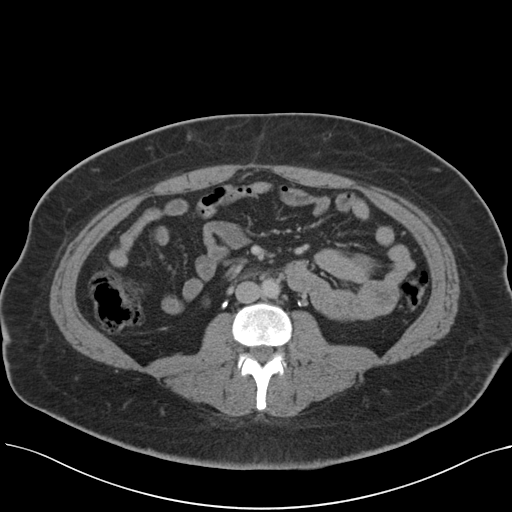
[im 55/98  soft-tissue]
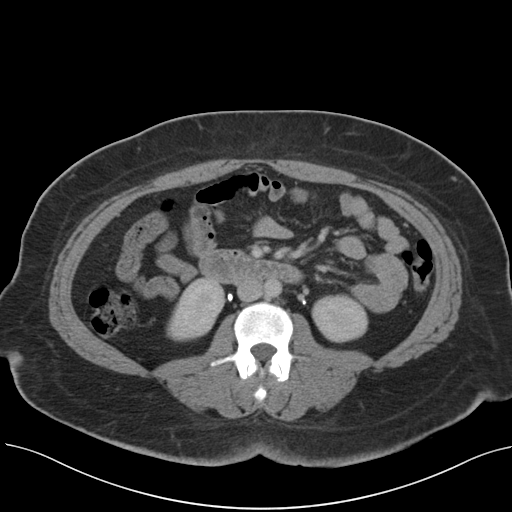
[im 63/98  soft-tissue]
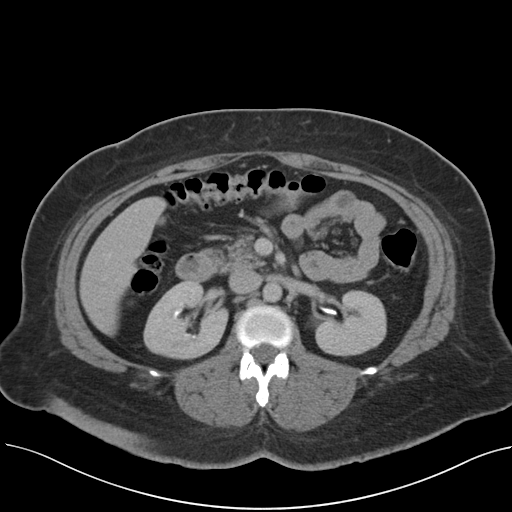
[im 63/98  bone]
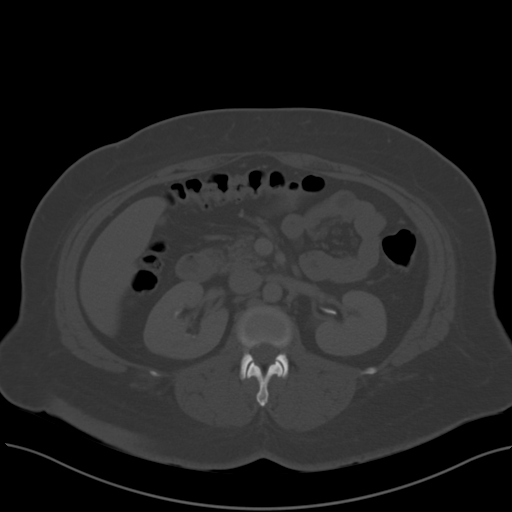
[im 70/98  soft-tissue]
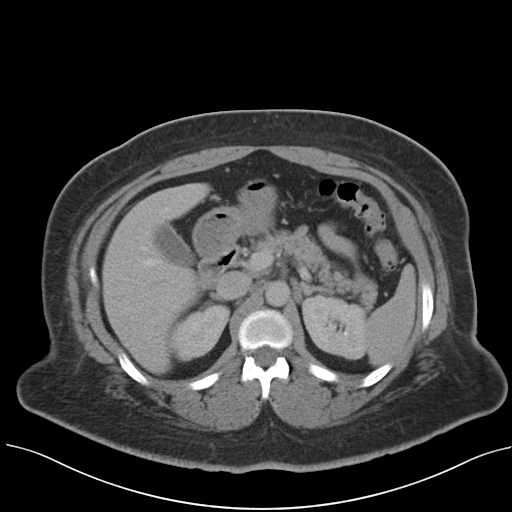
[im 78/98  soft-tissue]
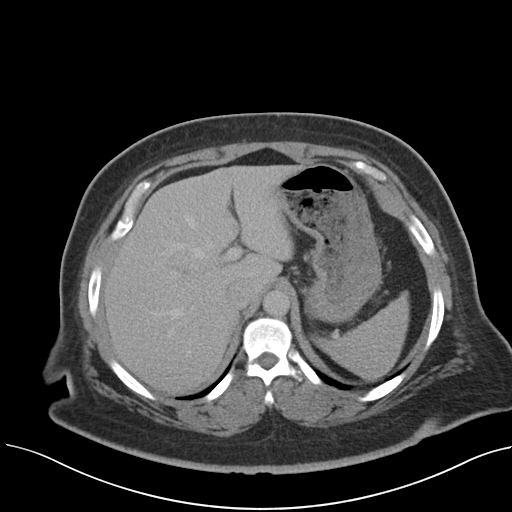
[im 82/98  lung]
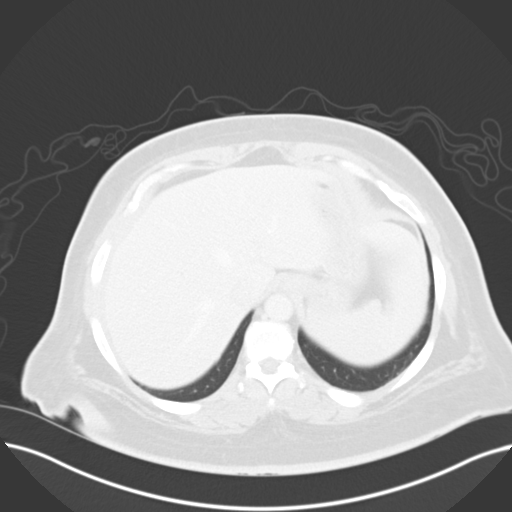
[im 86/98  soft-tissue]
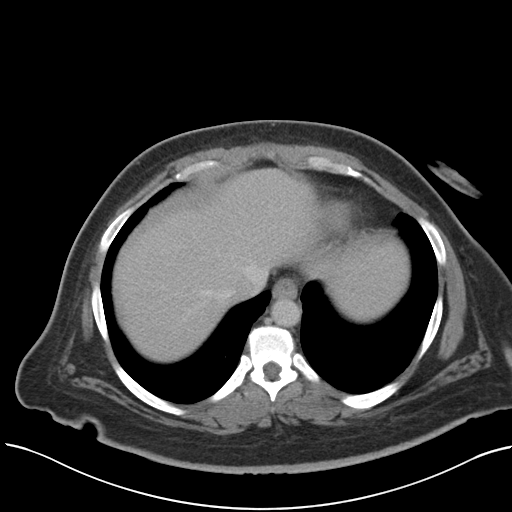
[im 86/98  lung]
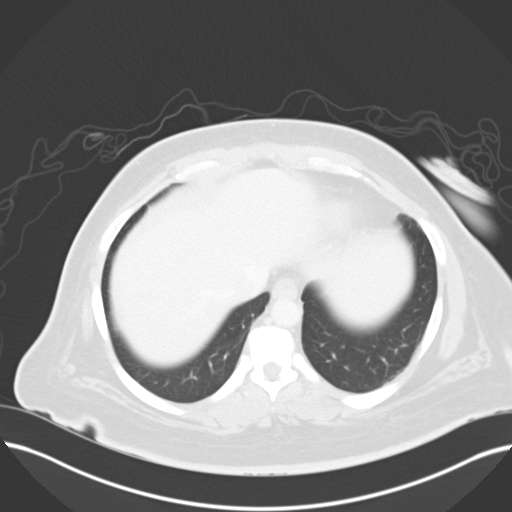
[im 90/98  lung]
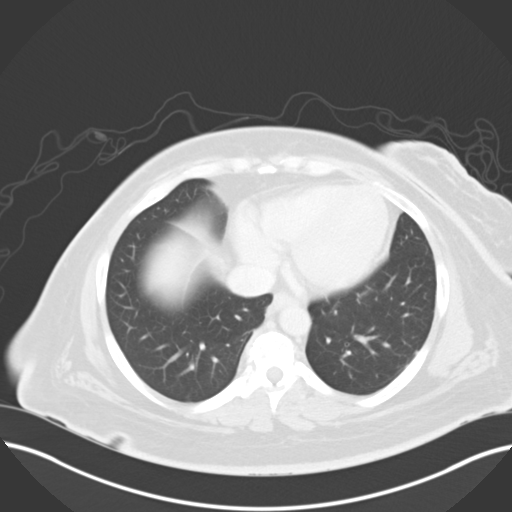
[im 94/98  soft-tissue]
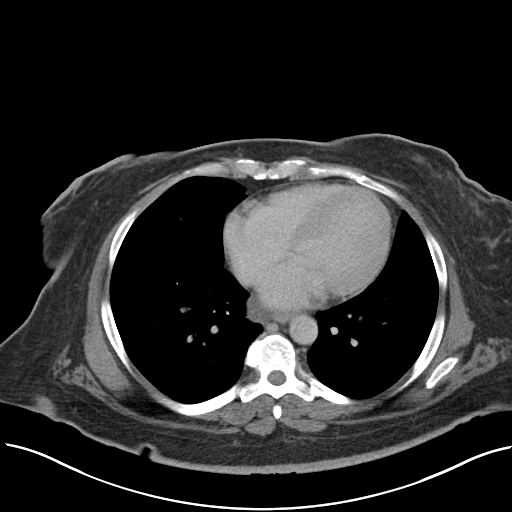
[im 94/98  lung]
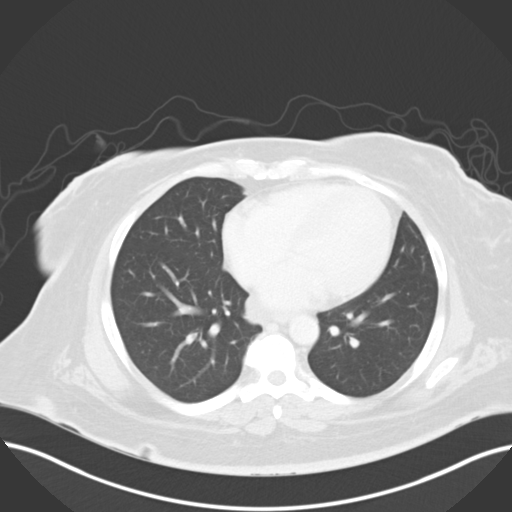

[15 of 32 positions shown; findings below may reference images not displayed]

FINDINGS: Lung bases are clear.

The liver, spleen, gallbladder, pancreas, adrenal glands, kidneys,
abdominal aorta, inferior vena cava, and retroperitoneal lymph nodes
are unremarkable. Stomach and small bowel are not abnormally
distended. Stool-filled colon without distention. No free air or
free fluid in the abdomen. Abdominal wall musculature appears
intact. Small accessory spleen.

Pelvis: Uterus and adnexal structures are not enlarged. Bladder wall
is not thickened. Appendix is normal. No evidence of diverticulitis.
Small amount of free fluid in the pelvis is probably physiologic.
Mild degenerative changes in the spine. No destructive bone lesions.
IMPRESSION: No acute process demonstrated in the abdomen or pelvis. Small amount
of free fluid in the pelvis is likely physiologic.

## 2016-08-10 ENCOUNTER — Inpatient Hospital Stay (HOSPITAL_COMMUNITY)
Admission: EM | Admit: 2016-08-10 | Discharge: 2016-08-14 | DRG: 074 | Disposition: A | Discharge status: Home / Self Care | Payer: Self-pay | Attending: Internal Medicine | Admitting: Internal Medicine

## 2016-08-10 ENCOUNTER — Encounter (HOSPITAL_COMMUNITY): Payer: Self-pay | Admitting: *Deleted

## 2016-08-10 DIAGNOSIS — Z955 Presence of coronary angioplasty implant and graft: Secondary | ICD-10-CM

## 2016-08-10 DIAGNOSIS — I2510 Atherosclerotic heart disease of native coronary artery without angina pectoris: Secondary | ICD-10-CM | POA: Diagnosis present

## 2016-08-10 DIAGNOSIS — E119 Type 2 diabetes mellitus without complications: Secondary | ICD-10-CM | POA: Diagnosis present

## 2016-08-10 DIAGNOSIS — R112 Nausea with vomiting, unspecified: Secondary | ICD-10-CM | POA: Diagnosis present

## 2016-08-10 DIAGNOSIS — Z862 Personal history of diseases of the blood and blood-forming organs and certain disorders involving the immune mechanism: Secondary | ICD-10-CM

## 2016-08-10 DIAGNOSIS — E1143 Type 2 diabetes mellitus with diabetic autonomic (poly)neuropathy: Principal | ICD-10-CM | POA: Diagnosis present

## 2016-08-10 DIAGNOSIS — E1165 Type 2 diabetes mellitus with hyperglycemia: Secondary | ICD-10-CM

## 2016-08-10 DIAGNOSIS — Z9114 Patient's other noncompliance with medication regimen: Secondary | ICD-10-CM

## 2016-08-10 DIAGNOSIS — F419 Anxiety disorder, unspecified: Secondary | ICD-10-CM | POA: Diagnosis present

## 2016-08-10 DIAGNOSIS — Z6834 Body mass index (BMI) 34.0-34.9, adult: Secondary | ICD-10-CM

## 2016-08-10 DIAGNOSIS — B181 Chronic viral hepatitis B without delta-agent: Secondary | ICD-10-CM | POA: Diagnosis present

## 2016-08-10 DIAGNOSIS — E669 Obesity, unspecified: Secondary | ICD-10-CM | POA: Diagnosis present

## 2016-08-10 DIAGNOSIS — E869 Volume depletion, unspecified: Secondary | ICD-10-CM | POA: Diagnosis present

## 2016-08-10 DIAGNOSIS — M797 Fibromyalgia: Secondary | ICD-10-CM | POA: Diagnosis present

## 2016-08-10 DIAGNOSIS — Z794 Long term (current) use of insulin: Secondary | ICD-10-CM

## 2016-08-10 DIAGNOSIS — R1110 Vomiting, unspecified: Secondary | ICD-10-CM | POA: Diagnosis present

## 2016-08-10 DIAGNOSIS — E785 Hyperlipidemia, unspecified: Secondary | ICD-10-CM | POA: Diagnosis present

## 2016-08-10 DIAGNOSIS — E871 Hypo-osmolality and hyponatremia: Secondary | ICD-10-CM | POA: Diagnosis present

## 2016-08-10 DIAGNOSIS — K219 Gastro-esophageal reflux disease without esophagitis: Secondary | ICD-10-CM | POA: Diagnosis present

## 2016-08-10 DIAGNOSIS — E860 Dehydration: Secondary | ICD-10-CM

## 2016-08-10 DIAGNOSIS — E1142 Type 2 diabetes mellitus with diabetic polyneuropathy: Secondary | ICD-10-CM | POA: Diagnosis present

## 2016-08-10 DIAGNOSIS — Z79899 Other long term (current) drug therapy: Secondary | ICD-10-CM

## 2016-08-10 DIAGNOSIS — I10 Essential (primary) hypertension: Secondary | ICD-10-CM | POA: Diagnosis present

## 2016-08-10 DIAGNOSIS — E872 Acidosis: Secondary | ICD-10-CM | POA: Diagnosis present

## 2016-08-10 DIAGNOSIS — Z8673 Personal history of transient ischemic attack (TIA), and cerebral infarction without residual deficits: Secondary | ICD-10-CM

## 2016-08-10 DIAGNOSIS — Z87891 Personal history of nicotine dependence: Secondary | ICD-10-CM

## 2016-08-10 DIAGNOSIS — D509 Iron deficiency anemia, unspecified: Secondary | ICD-10-CM | POA: Diagnosis present

## 2016-08-10 DIAGNOSIS — F329 Major depressive disorder, single episode, unspecified: Secondary | ICD-10-CM | POA: Diagnosis present

## 2016-08-10 DIAGNOSIS — G4733 Obstructive sleep apnea (adult) (pediatric): Secondary | ICD-10-CM | POA: Diagnosis present

## 2016-08-10 DIAGNOSIS — K3184 Gastroparesis: Secondary | ICD-10-CM | POA: Diagnosis present

## 2016-08-10 DIAGNOSIS — Z9119 Patient's noncompliance with other medical treatment and regimen: Secondary | ICD-10-CM

## 2016-08-10 DIAGNOSIS — R32 Unspecified urinary incontinence: Secondary | ICD-10-CM | POA: Diagnosis present

## 2016-08-10 DIAGNOSIS — I252 Old myocardial infarction: Secondary | ICD-10-CM

## 2016-08-10 LAB — CBC
HCT: 25.2 % — ABNORMAL LOW (ref 36.0–46.0)
Hemoglobin: 7.8 g/dL — ABNORMAL LOW (ref 12.0–15.0)
MCH: 18.8 pg — AB (ref 26.0–34.0)
MCHC: 31 g/dL (ref 30.0–36.0)
MCV: 60.6 fL — AB (ref 78.0–100.0)
PLATELETS: 413 10*3/uL — AB (ref 150–400)
RBC: 4.16 MIL/uL (ref 3.87–5.11)
RDW: 18.3 % — ABNORMAL HIGH (ref 11.5–15.5)
WBC: 12.7 10*3/uL — ABNORMAL HIGH (ref 4.0–10.5)

## 2016-08-10 LAB — COMPREHENSIVE METABOLIC PANEL
ALBUMIN: 3.3 g/dL — AB (ref 3.5–5.0)
ALK PHOS: 54 U/L (ref 38–126)
ALT: 8 U/L — AB (ref 14–54)
AST: 19 U/L (ref 15–41)
Anion gap: 10 (ref 5–15)
BUN: 11 mg/dL (ref 6–20)
CALCIUM: 9 mg/dL (ref 8.9–10.3)
CO2: 21 mmol/L — AB (ref 22–32)
CREATININE: 0.86 mg/dL (ref 0.44–1.00)
Chloride: 103 mmol/L (ref 101–111)
GFR calc Af Amer: >60 mL/min (ref >60)
GFR calc non Af Amer: >60 mL/min (ref >60)
GLUCOSE: 170 mg/dL — AB (ref 65–99)
Potassium: 3.7 mmol/L (ref 3.5–5.1)
Sodium: 134 mmol/L — ABNORMAL LOW (ref 135–145)
Total Bilirubin: 0.2 mg/dL — ABNORMAL LOW (ref 0.3–1.2)
Total Protein: 7.6 g/dL (ref 6.5–8.1)

## 2016-08-10 LAB — URINALYSIS, ROUTINE W REFLEX MICROSCOPIC
Bilirubin Urine: NEGATIVE
GLUCOSE, UA: 50 mg/dL — AB
HGB URINE DIPSTICK: NEGATIVE
Ketones, ur: NEGATIVE mg/dL
Leukocytes, UA: NEGATIVE
Nitrite: NEGATIVE
Protein, ur: 30 mg/dL — AB
SPECIFIC GRAVITY, URINE: 1.015 (ref 1.005–1.030)
pH: 8 (ref 5.0–8.0)

## 2016-08-10 LAB — I-STAT BETA HCG BLOOD, ED (MC, WL, AP ONLY)

## 2016-08-10 LAB — LIPASE, BLOOD: Lipase: 25 U/L (ref 11–51)

## 2016-08-10 MED ORDER — SODIUM CHLORIDE 0.9 % IV BOLUS (SEPSIS): 1000.0000 mL | Freq: Once | INTRAVENOUS | Status: DC

## 2016-08-10 MED ORDER — DIPHENHYDRAMINE HCL 50 MG/ML IJ SOLN
25.0000 mg | Freq: Once | INTRAMUSCULAR | Status: AC
  Administered 2016-08-10: 25 mg via INTRAVENOUS
  Filled 2016-08-10: qty 1

## 2016-08-10 MED ORDER — SODIUM CHLORIDE 0.9 % IV BOLUS (SEPSIS)
1000.0000 mL | Freq: Once | INTRAVENOUS | Status: AC
  Administered 2016-08-10: 1000 mL via INTRAVENOUS

## 2016-08-10 MED ORDER — METOCLOPRAMIDE HCL 5 MG/ML IJ SOLN
10.0000 mg | Freq: Once | INTRAMUSCULAR | Status: AC
  Administered 2016-08-10: 10 mg via INTRAVENOUS
  Filled 2016-08-10: qty 2

## 2016-08-10 MED ORDER — LORAZEPAM 2 MG/ML IJ SOLN
1.0000 mg | Freq: Once | INTRAMUSCULAR | Status: AC
  Administered 2016-08-10: 1 mg via INTRAVENOUS
  Filled 2016-08-10: qty 1

## 2016-08-10 MED ORDER — HALOPERIDOL LACTATE 5 MG/ML IJ SOLN
5.0000 mg | Freq: Once | INTRAMUSCULAR | Status: AC
  Administered 2016-08-10: 5 mg via INTRAVENOUS
  Filled 2016-08-10: qty 1

## 2016-08-10 MED ORDER — PROMETHAZINE HCL 25 MG/ML IJ SOLN
25.0000 mg | Freq: Once | INTRAMUSCULAR | Status: AC
  Administered 2016-08-10: 25 mg via INTRAVENOUS
  Filled 2016-08-10: qty 1

## 2016-08-10 NOTE — ED Triage Notes (Signed)
Pt reports having left side abd pain and n/v since this morning. Denies diarrhea, last bm was this morning.

## 2016-08-11 ENCOUNTER — Inpatient Hospital Stay (HOSPITAL_COMMUNITY): Payer: Self-pay

## 2016-08-11 DIAGNOSIS — E1165 Type 2 diabetes mellitus with hyperglycemia: Secondary | ICD-10-CM

## 2016-08-11 DIAGNOSIS — I10 Essential (primary) hypertension: Secondary | ICD-10-CM

## 2016-08-11 DIAGNOSIS — K3184 Gastroparesis: Secondary | ICD-10-CM

## 2016-08-11 DIAGNOSIS — R112 Nausea with vomiting, unspecified: Secondary | ICD-10-CM

## 2016-08-11 DIAGNOSIS — D509 Iron deficiency anemia, unspecified: Secondary | ICD-10-CM

## 2016-08-11 DIAGNOSIS — E860 Dehydration: Secondary | ICD-10-CM

## 2016-08-11 DIAGNOSIS — Z862 Personal history of diseases of the blood and blood-forming organs and certain disorders involving the immune mechanism: Secondary | ICD-10-CM

## 2016-08-11 DIAGNOSIS — E1143 Type 2 diabetes mellitus with diabetic autonomic (poly)neuropathy: Principal | ICD-10-CM

## 2016-08-11 DIAGNOSIS — E1142 Type 2 diabetes mellitus with diabetic polyneuropathy: Secondary | ICD-10-CM

## 2016-08-11 LAB — GLUCOSE, CAPILLARY
GLUCOSE-CAPILLARY: 196 mg/dL — AB (ref 65–99)
GLUCOSE-CAPILLARY: 172 mg/dL — AB (ref 65–99)
Glucose-Capillary: 203 mg/dL — ABNORMAL HIGH (ref 65–99)
Glucose-Capillary: 142 mg/dL — ABNORMAL HIGH (ref 65–99)
Glucose-Capillary: 121 mg/dL — ABNORMAL HIGH (ref 65–99)

## 2016-08-11 LAB — CBC
HCT: 26.6 % — ABNORMAL LOW (ref 36.0–46.0)
HEMOGLOBIN: 8.3 g/dL — AB (ref 12.0–15.0)
MCH: 19 pg — AB (ref 26.0–34.0)
MCHC: 31.2 g/dL (ref 30.0–36.0)
MCV: 60.9 fL — AB (ref 78.0–100.0)
Platelets: 403 10*3/uL — ABNORMAL HIGH (ref 150–400)
RBC: 4.37 MIL/uL (ref 3.87–5.11)
RDW: 18.3 % — ABNORMAL HIGH (ref 11.5–15.5)
WBC: 15.4 10*3/uL — ABNORMAL HIGH (ref 4.0–10.5)

## 2016-08-11 LAB — DIFFERENTIAL
BASOS PCT: 0 %
BLASTS: 0 %
Band Neutrophils: 0 %
Basophils Absolute: 0 10*3/uL (ref 0.0–0.1)
EOS PCT: 0 %
Eosinophils Absolute: 0 10*3/uL (ref 0.0–0.7)
LYMPHS PCT: 8 %
Lymphs Abs: 1.2 10*3/uL (ref 0.7–4.0)
MONO ABS: 0.3 10*3/uL (ref 0.1–1.0)
MONOS PCT: 2 %
Metamyelocytes Relative: 0 %
Myelocytes: 0 %
NEUTROS PCT: 90 %
NRBC: 0 /100{WBCs}
Neutro Abs: 13.9 10*3/uL — ABNORMAL HIGH (ref 1.7–7.7)
OTHER: 0 %
Promyelocytes Absolute: 0 %

## 2016-08-11 LAB — BASIC METABOLIC PANEL
Anion gap: 15 (ref 5–15)
BUN: 8 mg/dL (ref 6–20)
CHLORIDE: 97 mmol/L — AB (ref 101–111)
CO2: 19 mmol/L — ABNORMAL LOW (ref 22–32)
Calcium: 9.1 mg/dL (ref 8.9–10.3)
Creatinine, Ser: 0.85 mg/dL (ref 0.44–1.00)
GFR calc Af Amer: >60 mL/min (ref >60)
Glucose, Bld: 208 mg/dL — ABNORMAL HIGH (ref 65–99)
POTASSIUM: 3.8 mmol/L (ref 3.5–5.1)
SODIUM: 131 mmol/L — AB (ref 135–145)

## 2016-08-11 LAB — IRON AND TIBC
IRON: 20 ug/dL — AB (ref 28–170)
Saturation Ratios: 4 % — ABNORMAL LOW (ref 10.4–31.8)
TIBC: 445 ug/dL (ref 250–450)
UIBC: 425 ug/dL

## 2016-08-11 LAB — MAGNESIUM: MAGNESIUM: 1.5 mg/dL — AB (ref 1.7–2.4)

## 2016-08-11 LAB — FERRITIN: Ferritin: 5 ng/mL — ABNORMAL LOW (ref 11–307)

## 2016-08-11 LAB — MRSA PCR SCREENING: MRSA by PCR: NEGATIVE

## 2016-08-11 MED ORDER — PANTOPRAZOLE SODIUM 40 MG IV SOLR
40.0000 mg | INTRAVENOUS | Status: DC
  Administered 2016-08-11: 40 mg via INTRAVENOUS
  Filled 2016-08-11: qty 40

## 2016-08-11 MED ORDER — INSULIN GLARGINE 100 UNIT/ML ~~LOC~~ SOLN
10.0000 [IU] | Freq: Every day | SUBCUTANEOUS | Status: DC
  Administered 2016-08-11 – 2016-08-12 (×2): 10 [IU] via SUBCUTANEOUS
  Filled 2016-08-11 (×2): qty 0.1

## 2016-08-11 MED ORDER — ONDANSETRON HCL 4 MG/2ML IJ SOLN
4.0000 mg | Freq: Four times a day (QID) | INTRAMUSCULAR | Status: DC | PRN
  Administered 2016-08-11 – 2016-08-12 (×2): 4 mg via INTRAVENOUS
  Filled 2016-08-11 (×2): qty 2

## 2016-08-11 MED ORDER — SODIUM CHLORIDE 0.9% FLUSH
3.0000 mL | Freq: Two times a day (BID) | INTRAVENOUS | Status: DC
  Administered 2016-08-11 – 2016-08-14 (×7): 3 mL via INTRAVENOUS

## 2016-08-11 MED ORDER — CHLORHEXIDINE GLUCONATE 0.12 % MT SOLN
15.0000 mL | Freq: Two times a day (BID) | OROMUCOSAL | Status: DC
  Administered 2016-08-11 – 2016-08-14 (×4): 15 mL via OROMUCOSAL
  Filled 2016-08-11 (×6): qty 15

## 2016-08-11 MED ORDER — ENOXAPARIN SODIUM 40 MG/0.4ML ~~LOC~~ SOLN
40.0000 mg | SUBCUTANEOUS | Status: DC
  Administered 2016-08-11 – 2016-08-14 (×4): 40 mg via SUBCUTANEOUS
  Filled 2016-08-11 (×4): qty 0.4

## 2016-08-11 MED ORDER — DIPHENHYDRAMINE HCL 50 MG/ML IJ SOLN: 12.5000 mg | Freq: Four times a day (QID) | INTRAMUSCULAR | Status: DC | PRN

## 2016-08-11 MED ORDER — INSULIN ASPART 100 UNIT/ML ~~LOC~~ SOLN
0.0000 [IU] | SUBCUTANEOUS | Status: DC
  Administered 2016-08-11: 3 [IU] via SUBCUTANEOUS
  Administered 2016-08-11: 5 [IU] via SUBCUTANEOUS
  Administered 2016-08-11: 2 [IU] via SUBCUTANEOUS
  Administered 2016-08-11: 3 [IU] via SUBCUTANEOUS
  Administered 2016-08-11: 2 [IU] via SUBCUTANEOUS
  Administered 2016-08-12 (×2): 5 [IU] via SUBCUTANEOUS
  Administered 2016-08-12: 3 [IU] via SUBCUTANEOUS
  Administered 2016-08-13: 2 [IU] via SUBCUTANEOUS
  Administered 2016-08-13 (×2): 3 [IU] via SUBCUTANEOUS
  Administered 2016-08-13: 11 [IU] via SUBCUTANEOUS
  Administered 2016-08-13: 8 [IU] via SUBCUTANEOUS
  Administered 2016-08-14: 2 [IU] via SUBCUTANEOUS
  Administered 2016-08-14: 8 [IU] via SUBCUTANEOUS
  Administered 2016-08-14: 3 [IU] via SUBCUTANEOUS

## 2016-08-11 MED ORDER — BISACODYL 10 MG RE SUPP: 10.0000 mg | Freq: Once | RECTAL | Status: DC

## 2016-08-11 MED ORDER — PROMETHAZINE HCL 25 MG RE SUPP: 25.0000 mg | Freq: Four times a day (QID) | RECTAL | Status: DC | PRN

## 2016-08-11 MED ORDER — HYDRALAZINE HCL 20 MG/ML IJ SOLN
10.0000 mg | INTRAMUSCULAR | Status: DC | PRN
  Administered 2016-08-11: 10 mg via INTRAVENOUS
  Filled 2016-08-11: qty 1

## 2016-08-11 MED ORDER — ORAL CARE MOUTH RINSE: 15.0000 mL | Freq: Two times a day (BID) | OROMUCOSAL | Status: DC

## 2016-08-11 MED ORDER — METOPROLOL TARTRATE 5 MG/5ML IV SOLN
5.0000 mg | Freq: Four times a day (QID) | INTRAVENOUS | Status: DC
  Administered 2016-08-11 – 2016-08-12 (×3): 5 mg via INTRAVENOUS
  Filled 2016-08-11 (×3): qty 5

## 2016-08-11 MED ORDER — METOCLOPRAMIDE HCL 5 MG/ML IJ SOLN
10.0000 mg | Freq: Three times a day (TID) | INTRAMUSCULAR | Status: DC
  Administered 2016-08-11 – 2016-08-14 (×13): 10 mg via INTRAVENOUS
  Filled 2016-08-11 (×13): qty 2

## 2016-08-11 MED ORDER — BISACODYL 5 MG PO TBEC
5.0000 mg | DELAYED_RELEASE_TABLET | Freq: Once | ORAL | Status: AC
  Administered 2016-08-11: 5 mg via ORAL
  Filled 2016-08-11: qty 1

## 2016-08-11 MED ORDER — SODIUM CHLORIDE 0.9 % IV SOLN
INTRAVENOUS | Status: DC
  Administered 2016-08-12: 01:00:00 via INTRAVENOUS

## 2016-08-11 MED ORDER — LABETALOL HCL 5 MG/ML IV SOLN
5.0000 mg | INTRAVENOUS | Status: DC | PRN
  Administered 2016-08-11: 5 mg via INTRAVENOUS
  Filled 2016-08-11: qty 4

## 2016-08-11 MED ORDER — ONDANSETRON HCL 4 MG PO TABS: 4.0000 mg | ORAL_TABLET | Freq: Four times a day (QID) | ORAL | Status: DC | PRN

## 2016-08-11 MED ORDER — CLONIDINE HCL 0.1 MG/24HR TD PTWK
0.1000 mg | MEDICATED_PATCH | TRANSDERMAL | Status: DC
  Administered 2016-08-11: 0.1 mg via TRANSDERMAL
  Filled 2016-08-11: qty 1

## 2016-08-11 MED ORDER — MAGNESIUM SULFATE 2 GM/50ML IV SOLN
2.0000 g | Freq: Once | INTRAVENOUS | Status: AC
Start: 2016-08-11 — End: 2016-08-11
  Administered 2016-08-11: 2 g via INTRAVENOUS
  Filled 2016-08-11: qty 50

## 2016-08-11 MED ORDER — METOPROLOL TARTRATE 5 MG/5ML IV SOLN
5.0000 mg | Freq: Four times a day (QID) | INTRAVENOUS | Status: DC
  Administered 2016-08-11: 5 mg via INTRAVENOUS
  Filled 2016-08-11: qty 5

## 2016-08-11 MED ORDER — SODIUM CHLORIDE 0.9 % IV SOLN
INTRAVENOUS | Status: AC
  Administered 2016-08-11 (×2): via INTRAVENOUS

## 2016-08-11 MED ORDER — HYDRALAZINE HCL 20 MG/ML IJ SOLN: 5.0000 mg | Freq: Four times a day (QID) | INTRAMUSCULAR | Status: DC | PRN

## 2016-08-11 MED ORDER — METOCLOPRAMIDE HCL 5 MG/ML IJ SOLN
5.0000 mg | Freq: Three times a day (TID) | INTRAMUSCULAR | Status: DC
  Administered 2016-08-11: 5 mg via INTRAVENOUS
  Filled 2016-08-11: qty 2

## 2016-08-11 MED ORDER — PROMETHAZINE HCL 25 MG/ML IJ SOLN: 25.0000 mg | Freq: Three times a day (TID) | INTRAMUSCULAR | Status: DC | PRN

## 2016-08-11 MED ORDER — FLEET ENEMA 7-19 GM/118ML RE ENEM
1.0000 | ENEMA | Freq: Once | RECTAL | Status: DC
  Filled 2016-08-11: qty 1

## 2016-08-11 NOTE — Care Management Note (Addendum)
Case Management Note  Patient Details  Name: SILVANNA OHMER MRN: [id number] Date of Birth: 1975/09/26  Subjective/Objective:   From home presents with gastroparesis flare, accelerated htn, chronic iron def anemia, noncompliant insulin dep typ  2 dm, . Patient has already had the Match program one time before, is not eligible this time.  She will need to call the [facility] clinic to make a hospital follow up apt at discharge. She will need generic medications at discharge.              Action/Plan:  NCM will follow for dc needs.  Expected Discharge Date:                  Expected Discharge Plan:     In-House Referral:     Discharge planning Services     Post Acute Care Choice:    Choice offered to:     DME Arranged:    DME Agency:     HH Arranged:    HH Agency:     Status of Service:     If discussed at [employer], dates discussed:    Additional Comments:  Zenon Mayo, RN 08/11/2016, 4:54 PM

## 2016-08-11 NOTE — ED Notes (Signed)
Educated pt on need of fluids, not cooperating with keeping her arm straight

## 2016-08-11 NOTE — Progress Notes (Signed)
PROGRESS NOTE        PATIENT DETAILS Name: Shirley [name] Age: 41 y.o. Sex: female Date of Birth: 1975/10/01 Admit Date: 08/10/2016 Admitting Physician Sid Falcon, MD PCP:No PCP Per Patient  Brief Narrative: Patient is a 41 y.o. female with history of insulin-dependent type 2 diabetes, hypertension, CVA, CAD-noncompliant to medications (she last took any of her medications approximately 2 months back) admitted for evaluation of intractable nausea and vomiting. See below for further details.  Subjective: Mostly dry heaving/retching per RN. Denies any abdominal pain. Claims last bowel movement was yesterday.  Denies any chest pain or shortness of breath.  Assessment/Plan: Intractable nausea with vomiting likely secondary to gastroparesis flare: Denies any abdominal pain-continues to have mostly retching-keep nothing by mouth, increase Reglan to 10 mg before meals at bedtime, continue as needed Phenergan. Lipase within normal limits. Abdominal x-ray done earlier this morning negative for acute abnormalities-but shows some amount of stool burden-we will give enema 1. Difficult situation-we will need optimal CBG control in the long term-however she is noncompliant.  Accelerated hypertension: Blood pressure elevated likely secondary to anxiety/above issues and inability to take oral medications due to vomiting. Start IV metoprolol schedule dosing, use hydralazine as needed. Continue transdermal clonidine. Once vomiting has resolved, we will resume oral antihypertensives.  Chronic iron deficiency anemia: Anemia panel consistent with iron deficiency, will give one dose of IV iron when more stable. Follow for now  Insulin-dependent type 2 diabetes: Noncompliant-supposed insulin as an outpatient-CBGs currently stable with Lantus 10 units, and SSI. Once diet advances-we will adjust her insulin regimen even more.  History of CAD: Once oral intake resumes-we will  restart antiplatelet agents.  History of CVA: Claims she had CVA with left-sided deficits in 2015-I see no focal neurological deficits on my exam-resume antiplatelet agents once oral intake as stabilized.  Dyslipidemia: Resume statin when oral intake has improved.  GERD: On IV PPI-transition to oral route when oral intake has stabilized.  Hypomagnesemia: We'll replete and recheck. Rule out a secondary to GI loss.  Noncompliance to medications: Claims she last took her medications approximately 2 months back-I have counseled regarding importance of compliance. We will ask case management to evaluate to see if patient can qualify for certain patient assistance programs.  DVT Prophylaxis: Prophylactic Lovenox   Code Status: Full code   Family Communication: None at bedside  Disposition Plan: Remain inpatient-remain in SDU  Antimicrobial agents: Anti-infectives    None     Procedures: None  CONSULTS:  None  Time spent: 25- minutes-Greater than 50% of this time was spent in counseling, explanation of diagnosis, planning of further management, and coordination of care.  MEDICATIONS: Scheduled Meds: . chlorhexidine  15 mL Mouth Rinse BID  . cloNIDine  0.1 mg Transdermal Weekly  . enoxaparin (LOVENOX) injection  40 mg Subcutaneous Q24H  . insulin aspart  0-15 Units Subcutaneous Q4H  . insulin glargine  10 Units Subcutaneous QHS  . mouth rinse  15 mL Mouth Rinse q12n4p  . metoCLOPramide (REGLAN) injection  10 mg Intravenous TID AC & HS  . metoprolol  5 mg Intravenous Q6H  . pantoprazole (PROTONIX) IV  40 mg Intravenous Q24H  . sodium chloride flush  3 mL Intravenous Q12H   Continuous Infusions: . sodium chloride 150 mL/hr at 08/11/16 0516  . magnesium sulfate 1 - 4 g bolus IVPB  PRN Meds:.diphenhydrAMINE, hydrALAZINE, [DISCONTINUED] ondansetron **OR** ondansetron (ZOFRAN) IV, promethazine   PHYSICAL EXAM: Vital signs: Vitals:   08/11/16 0258 08/11/16 0306  08/11/16 0354 08/11/16 0726  BP: (!) 163/114  (!) 160/111 (!) 188/100  Pulse:   (!) 122 (!) 121  Resp: 17  18 19   Temp: 98.6 F (37 C)  98.8 F (37.1 C) 99.4 F (37.4 C)  TempSrc: Axillary  Axillary Oral  SpO2: 100%  100% 100%  Weight:  99.8 kg (220 lb 0.3 oz)    Height:  5\' 7"  (1.702 m)     Filed Weights   08/11/16 0306  Weight: 99.8 kg (220 lb 0.3 oz)   Body mass index is 34.46 kg/m.   General appearance :Awake, alert, not in any distress. Speech Clear.  Eyes:, pupils equally reactive to light and accomodation,no scleral icterus. HEENT: Atraumatic and Normocephalic Neck: supple, no JVD. No cervical lymphadenopathy.  Resp:Good air entry bilaterally, no added sounds  CVS: S1 S2 regular, tachy GI: Bowel sounds present, Non tender and not distended with no gaurding, rigidity or rebound.No organomegaly Extremities: B/L Lower Ext shows no edema, both legs are warm to touch Neurology:  speech clear,Non focal, sensation is grossly intact. Psychiatric: Normal judgment and insight. Alert and oriented x 3. Normal mood. Musculoskeletal:No digital cyanosis Skin:No Rash, warm and dry Wounds:N/A  I have personally reviewed following labs and imaging studies  LABORATORY DATA: CBC:  Recent Labs Lab 08/10/16 1755 08/11/16 0320  WBC 12.7* 15.4*  HGB 7.8* 8.3*  HCT 25.2* 26.6*  MCV 60.6* 60.9*  PLT 413* 403*    Basic Metabolic Panel:  Recent Labs Lab 08/10/16 1755 08/11/16 0320 08/11/16 0522  NA 134* 131*  --   K 3.7 3.8  --   CL 103 97*  --   CO2 21* 19*  --   GLUCOSE 170* 208*  --   BUN 11 8  --   CREATININE 0.86 0.85  --   CALCIUM 9.0 9.1  --   MG  --   --  1.5*    GFR: Estimated Creatinine Clearance: 104.7 mL/min (by C-G formula based on SCr of 0.85 mg/dL).  Liver Function Tests:  Recent Labs Lab 08/10/16 1755  AST 19  ALT 8*  ALKPHOS 54  BILITOT 0.2*  PROT 7.6  ALBUMIN 3.3*    Recent Labs Lab 08/10/16 1755  LIPASE 25   No results for  input(s): AMMONIA in the last 168 hours.  Coagulation Profile: No results for input(s): INR, PROTIME in the last 168 hours.  Cardiac Enzymes: No results for input(s): CKTOTAL, CKMB, CKMBINDEX, TROPONINI in the last 168 hours.  BNP (last 3 results) No results for input(s): PROBNP in the last 8760 hours.  HbA1C: No results for input(s): HGBA1C in the last 72 hours.  CBG:  Recent Labs Lab 08/11/16 0400  GLUCAP 196*    Lipid Profile: No results for input(s): CHOL, HDL, LDLCALC, TRIG, CHOLHDL, LDLDIRECT in the last 72 hours.  Thyroid Function Tests: No results for input(s): TSH, T4TOTAL, FREET4, T3FREE, THYROIDAB in the last 72 hours.  Anemia Panel:  Recent Labs  08/11/16 0320  FERRITIN 5*  TIBC 445  IRON 20*    Urine analysis:    Component Value Date/Time   COLORURINE YELLOW 08/10/2016 1857   APPEARANCEUR CLOUDY (A) 08/10/2016 1857   LABSPEC 1.015 08/10/2016 1857   PHURINE 8.0 08/10/2016 1857   GLUCOSEU 50 (A) 08/10/2016 1857   GLUCOSEU > 1000 mg/dL (A) 07/07/2008 2115  HGBUR NEGATIVE 08/10/2016 1857   HGBUR negative 08/12/2008 1444   BILIRUBINUR NEGATIVE 08/10/2016 1857   BILIRUBINUR Negative 02/18/2014 1134   Tensed 08/10/2016 1857   PROTEINUR 30 (A) 08/10/2016 1857   UROBILINOGEN 0.2 11/11/2014 1658   NITRITE NEGATIVE 08/10/2016 1857   LEUKOCYTESUR NEGATIVE 08/10/2016 1857    Sepsis Labs: Lactic Acid, Venous    Component Value Date/Time   LATICACIDVEN 1.08 01/09/2016 1638    MICROBIOLOGY: Recent Results (from the past 240 hour(s))  MRSA PCR Screening     Status: None   Collection Time: 08/11/16  3:10 AM  Result Value Ref Range Status   MRSA by PCR NEGATIVE NEGATIVE Final    Comment:        The GeneXpert MRSA Assay (FDA approved for NASAL specimens only), is one component of a comprehensive MRSA colonization surveillance program. It is not intended to diagnose MRSA infection nor to guide or monitor treatment for MRSA  infections.     RADIOLOGY STUDIES/RESULTS: No results found.   LOS: 0 days   Oren Binet, MD  [facility] Pager:[phone]  If 7PM-7AM, please contact night-coverage [email or web address] Password [facility] 08/11/2016, 7:58 AM

## 2016-08-11 NOTE — ED Provider Notes (Signed)
[facility] [place] DEPT Provider Note   CSN: [id number] Arrival date & time: 08/10/16  1732     History   Chief Complaint Chief Complaint  Patient presents with  . Emesis  . Abdominal Pain    HPI Shirley [name] is a 41 y.o. female.  HPI Patient presents to the emergency department withPersistent nausea and vomiting.  The patient has a history of gastroparesis and states that she started having worsening symptoms this morning.  She states that she does not having abdominal discomfort.  Patient states that nothing seems make the condition better or worse. The patient denies chest pain, shortness of breath, headache,blurred vision, neck pain, fever, cough, weakness, numbness, dizziness, anorexia, edema, diarrhea, rash, back pain, dysuria, hematemesis, bloody stool, near syncope, or syncope.  Patient has been seen in the emergency department multiple times for gastroparesis Past Medical History:  Diagnosis Date  . Abscess of tunica vaginalis    10/09- Abundant S. aureus- sensitive to all abx  . Anxiety   . Blood dyscrasia   . CAD (coronary artery disease) 06/15/2006   s/p Subendocardial MI with PDA angioplasty(no stent) on 06/15/06 and relook  cath 06/19/06 showed patency of site. Cath 12/10- no restenosis or significant CAD progression  . CVA (cerebral vascular accident) ([place]) ~ 02/2014   denies residual on 04/22/2014  . CVA (cerebral vascular accident) [facility])    history of remote right cerebellar infarct noted on head CT at least since 10/2011  . Depression   . Diabetes mellitus type 2, uncontrolled, with complications ([place])   . Fibromyalgia   . Gastritis   . Gastroparesis    secondary to poorly controlled DM, last emptying study performed 01/2010  was normal but may be falsely positive as pt was on reglan  . GERD (gastroesophageal reflux disease)   . Hepatitis B, chronic (HCC)    Hep BeAb+,Hep B cAb+ & Hep BsAg+ (9/06)  . History of pyelonephritis    H/o GrpB Pyelonephritis (9/06)  and UTI- 07/11- E.Coli, 12/10- GBS  . Hyperlipidemia   . Hypertension   . Iron deficiency anemia   . Irregular menses    Small ovarian follicles seen on PR(9/16)  . MI (myocardial infarction) ([place]) 05/2006   PDA percutaneous transluminal coronary angioplasty  . Migraine    "weekly" (04/22/2014)  . N&V (nausea and vomiting)    Chronic. Unclear etiology with multiple admission and ED visits. CT abdomen with and without contrast (02/2011)  showed no acute process. Gastic Emptying scan (01/2010) was normal. Ultrasound of the abdomen was within normal limits. Hepatitis B viral load was undectable. HIV NR. EGD - gastritis, Hpylori + s/p Rx  . Obesity   . OSA (obstructive sleep apnea)    "suppose to wear mask but I don't" (04/22/2014)  . Peripheral neuropathy   . Pneumonia    "this is probably the 2nd or 3rd time I've had pneumonia" (04/22/2014)  . Recurrent boils   . Seasonal asthma   . Substance abuse   . Thrombocytosis ([place])    Hem/Onc suggested 2/2 chronic hepatits and/or iron deficiency anemia    Patient Active Problem List   Diagnosis Date Noted  . Type 2 diabetes mellitus with hyperglycemia ([place]) 04/07/2016  . Nausea & vomiting 04/07/2016  . Diabetic gastroparesis ([place]) 04/06/2016  . Non-intractable cyclical vomiting with nausea   . Dehydration 03/27/2015  . Intractable vomiting with nausea 11/11/2014  . Gastroparesis 11/11/2014  . Cough   . Hematemesis 10/13/2014  . DKA (diabetic ketoacidoses) ([place])  10/13/2014  . Increased anion gap metabolic acidosis 01/75/1025  . Tachycardia 07/21/2014  . Abdominal pain, chronic, left lower quadrant   . Nausea with vomiting   . Diabetic gastroparesis associated with type 2 diabetes mellitus ([place]) 04/28/2014  . History of Helicobacter pylori infection 04/22/2014  . Vaginal discharge 02/18/2014  . Atypical chest pain 07/22/2013  . Unspecified constipation 07/21/2013  . Tinea corporis 07/21/2013  . Intractable vomiting 04/29/2013  .  Dysmenorrhea 04/22/2013  . Headache(784.0) 02/08/2012  . Health care maintenance 01/22/2012  . Chronic hepatitis B ([place]) 03/07/2011  . History of leukocytosis 04/06/2010  . THROMBOCYTOSIS 04/06/2010  . Polysubstance abuse 02/23/2010  . Iron deficiency anemia 11/22/2009  . PERIPHERAL NEUROPATHY 10/01/2009  . Hyperlipidemia 08/30/2009  . POLYNEUROPATHY, DIABETIC 08/30/2009  . Hidradenitis (recurrent boils) 07/07/2008  . Depression 12/27/2007  . Abdominal pain, left lower quadrant 11/21/2007  . FIBROMYALGIA 10/30/2007  . BACK PAIN 04/01/2007  . OBSTRUCTIVE SLEEP APNEA 01/17/2007  . ANXIETY DEPRESSION 06/27/2006  . Chronic ischemic heart disease 06/15/2006  . OBESITY, MORBID 05/15/2006  . MIGRAINE HEADACHE 05/15/2006  . Asthma 05/15/2006  . Essential hypertension 01/16/2006  . IRREGULAR MENSTRUATION 01/16/2006  . PEDAL EDEMA 01/16/2006  . Poorly controlled type 2 diabetes mellitus with peripheral neuropathy ([place]) 01/16/1989    Past Surgical History:  Procedure Laterality Date  . CESAREAN SECTION  1997  . CORONARY ANGIOPLASTY WITH STENT PLACEMENT  2008   "2 stents"  . ESOPHAGOGASTRODUODENOSCOPY N/A 04/23/2014   Procedure: ESOPHAGOGASTRODUODENOSCOPY (EGD);  Surgeon: Winfield Cunas., MD;  Location: [facility] ENDOSCOPY;  Service: Endoscopy;  Laterality: N/A;    OB History    No data available       Home Medications    Prior to Admission medications   Medication Sig Start Date End Date Taking? Authorizing Provider  acetaminophen (TYLENOL) 500 MG tablet Take 500 mg by mouth every 6 (six) hours as needed.    Historical Provider, MD  albuterol (PROVENTIL HFA;VENTOLIN HFA) 108 (90 BASE) MCG/ACT inhaler Inhale 2 puffs into the lungs every 4 (four) hours as needed for wheezing or shortness of breath. Patient not taking: Reported on 05/29/2016 04/26/14   Alexa Angela Burke, MD  amLODipine (NORVASC) 10 MG tablet Take 1 tablet (10 mg total) by mouth daily. 05/29/16   Larene Pickett, PA-C    atorvastatin (LIPITOR) 80 MG tablet Take 1 tablet (80 mg total) by mouth daily. 05/29/16   Larene Pickett, PA-C  ferrous sulfate 325 (65 FE) MG tablet Take 1 tablet (325 mg total) by mouth daily. 05/29/16   Larene Pickett, PA-C  insulin aspart (NOVOLOG) 100 UNIT/ML injection Inject 1-6 Units into the skin 3 (three) times daily before meals. 05/29/16   Larene Pickett, PA-C  insulin glargine (LANTUS) 100 unit/mL SOPN Inject 0.1 mLs (10 Units total) into the skin at bedtime. 05/29/16   Larene Pickett, PA-C  losartan (COZAAR) 25 MG tablet Take 1 tablet (25 mg total) by mouth daily. 05/29/16   Larene Pickett, PA-C  metoCLOPramide (REGLAN) 10 MG tablet Take 1 tablet (10 mg total) by mouth every 6 (six) hours. 07/27/16   Ozella Almond Ward, PA-C  ondansetron (ZOFRAN ODT) 8 MG disintegrating tablet Take 1 tablet (8 mg total) by mouth every 8 (eight) hours as needed for nausea. 07/18/16   Varney Biles, MD  pantoprazole (PROTONIX) 40 MG tablet Take 1 tablet (40 mg total) by mouth daily. 05/29/16   Larene Pickett, PA-C  phenazopyridine (PYRIDIUM) 200 MG  tablet Take 1 tablet (200 mg total) by mouth 3 (three) times daily with meals. Patient not taking: Reported on 05/29/2016 03/20/16   Reyne Dumas, MD  promethazine (PHENERGAN) 25 MG suppository Place 1 suppository (25 mg total) rectally every 6 (six) hours as needed for nausea. 07/18/16   Varney Biles, MD  traMADol (ULTRAM) 50 MG tablet Take 1 tablet (50 mg total) by mouth every 6 (six) hours as needed. Patient not taking: Reported on 05/29/2016 03/05/16   Milton Ferguson, MD    Family History Family History  Problem Relation Age of Onset  . Diabetes Father     Social History Social History  Substance Use Topics  . Smoking status: Former Smoker    Types: Cigarettes    Quit date: 04/24/1996  . Smokeless tobacco: Never Used     Comment: quit smoking cigarettes age 26  . Alcohol use 0.0 oz/week     Comment: 04/22/2014 "might have a few drinks a month"      Allergies   Lisinopril and Morphine and related   Review of Systems Review of Systems  All other systems negative except as documented in the HPI. All pertinent positives and negatives as reviewed in the HPI. Physical Exam Updated Vital Signs BP (!) 189/109   Pulse (!) 115   Temp 98.5 F (36.9 C) (Oral)   Resp 18   LMP 07/12/2016   SpO2 100%   Physical Exam  Constitutional: She is oriented to person, place, and time. She appears well-developed and well-nourished. No distress.  HENT:  Head: Normocephalic and atraumatic.  Mouth/Throat: Oropharynx is clear and moist.  Eyes: Pupils are equal, round, and reactive to light.  Neck: Normal range of motion. Neck supple.  Cardiovascular: Normal rate, regular rhythm and normal heart sounds.  Exam reveals no gallop and no friction rub.   No murmur heard. Pulmonary/Chest: Effort normal and breath sounds normal. No respiratory distress. She has no wheezes.  Abdominal: Soft. Bowel sounds are normal. She exhibits no distension and no mass. There is tenderness. There is no guarding.  Neurological: She is alert and oriented to person, place, and time. She exhibits normal muscle tone. Coordination normal.  Skin: Skin is warm and dry. Capillary refill takes less than 2 seconds. No rash noted. No erythema.  Psychiatric: She has a normal mood and affect. Her behavior is normal.  Nursing note and vitals reviewed.    ED Treatments / Results  Labs (all labs ordered are listed, but only abnormal results are displayed) Labs Reviewed  COMPREHENSIVE METABOLIC PANEL - Abnormal; Notable for the following:       Result Value   Sodium 134 (*)    CO2 21 (*)    Glucose, Bld 170 (*)    Albumin 3.3 (*)    ALT 8 (*)    Total Bilirubin 0.2 (*)    All other components within normal limits  CBC - Abnormal; Notable for the following:    WBC 12.7 (*)    Hemoglobin 7.8 (*)    HCT 25.2 (*)    MCV 60.6 (*)    MCH 18.8 (*)    RDW 18.3 (*)     Platelets 413 (*)    All other components within normal limits  URINALYSIS, ROUTINE W REFLEX MICROSCOPIC - Abnormal; Notable for the following:    APPearance CLOUDY (*)    Glucose, UA 50 (*)    Protein, ur 30 (*)    Bacteria, UA RARE (*)    Squamous  Epithelial / LPF TOO NUMEROUS TO COUNT (*)    All other components within normal limits  LIPASE, BLOOD  I-STAT BETA HCG BLOOD, ED ([facility], [facility], [facility] ONLY)    EKG  EKG Interpretation None       Radiology No results found.  Procedures Procedures (including critical care time)  Medications Ordered in ED Medications  sodium chloride 0.9 % bolus 1,000 mL (not administered)  sodium chloride 0.9 % bolus 1,000 mL (1,000 mLs Intravenous New Bag/Given 08/10/16 1941)  promethazine (PHENERGAN) injection 25 mg (25 mg Intravenous Given 08/10/16 1941)  LORazepam (ATIVAN) injection 1 mg (1 mg Intravenous Given 08/10/16 2005)  haloperidol lactate (HALDOL) injection 5 mg (5 mg Intravenous Given 08/10/16 2117)  metoCLOPramide (REGLAN) injection 10 mg (10 mg Intravenous Given 08/10/16 2331)  diphenhydrAMINE (BENADRYL) injection 25 mg (25 mg Intravenous Given 08/10/16 2331)     Initial Impression / Assessment and Plan / ED Course  I have reviewed the triage vital signs and the nursing notes.  Pertinent labs & imaging results that were available during my care of the patient were reviewed by me and considered in my medical decision making (see chart for details).    Patient will be admitted to the hospital for further evaluation of her gastroparesis and intractable vomiting.  Patient is advised of the plan and all questions were answered.  I spoke with the [facility] hospitalist, who will admit the patient   Final Clinical Impressions(s) / ED Diagnoses   Final diagnoses:  None    New Prescriptions New Prescriptions   No medications on file     Dalia Heading, PA-C 08/11/16 0117    Fatima Blank, MD 08/11/16 1406

## 2016-08-11 NOTE — H&P (Signed)
History and Physical    Shirley [name]:[id number] DOB: 09/20/1975 DOA: 08/10/2016  PCP: No PCP Per Patient confirmed with patient.  Patient coming from: Home  Chief Complaint: Nausea and vomiting  HPI: Shirley [name] is a 41 y.o. female with medical history significant of gastroparesis, DM2, iron deficiency anemia, HTN, HLD, GERD, FM, CVA, anxiety who presents for intractable nausea and vomiting for 2 days.  She notes that she has not been taking her medications since the last time she was here.  Per our records, she was last given refills of her meds in February for a 2 week supply.  She has a history of no shows to outpatient clinics and did no show to an appointment with [facility] in March.  She reports that the nausea and vomiting started 2 days ago.  It is similar to previous episodes of gastroparesis she has had in the past.  She has not been checking her blood sugars.  She has not been able to keep down any oral foods or liquids.  She has been having headaches during the last 2 days.  She further notes that in the last hour she has been having urinary incontinence and she is unable to make it to the bedside commode in a timely manner.  She also notes dysuria.  Shirley [name] was falling asleep at points through interview, difficult to illicit full history.  She denies chest pain, SOB, sore throat.    ED Course: In the ED, multiple modalities for nausea and vomiting control were attempted without much success.  UA had excessive squamous cells, not interpretable.  She had very elevated BP, though this is likely chronic given she has been out of her medications.  She has also been tachycardic with only mild improvement with fluids.    Review of Systems: As per HPI otherwise 10 point review of systems negative.    Past Medical History:  Diagnosis Date  . Abscess of tunica vaginalis    10/09- Abundant S. aureus- sensitive to all abx  . Anxiety   . Blood dyscrasia   . CAD  (coronary artery disease) 06/15/2006   s/p Subendocardial MI with PDA angioplasty(no stent) on 06/15/06 and relook  cath 06/19/06 showed patency of site. Cath 12/10- no restenosis or significant CAD progression  . CVA (cerebral vascular accident) ([place]) ~ 02/2014   denies residual on 04/22/2014  . CVA (cerebral vascular accident) [facility])    history of remote right cerebellar infarct noted on head CT at least since 10/2011  . Depression   . Diabetes mellitus type 2, uncontrolled, with complications ([place])   . Fibromyalgia   . Gastritis   . Gastroparesis    secondary to poorly controlled DM, last emptying study performed 01/2010  was normal but may be falsely positive as pt was on reglan  . GERD (gastroesophageal reflux disease)   . Hepatitis B, chronic (HCC)    Hep BeAb+,Hep B cAb+ & Hep BsAg+ (9/06)  . History of pyelonephritis    H/o GrpB Pyelonephritis (9/06) and UTI- 07/11- E.Coli, 12/10- GBS  . Hyperlipidemia   . Hypertension   . Iron deficiency anemia   . Irregular menses    Small ovarian follicles seen on NW(2/95)  . MI (myocardial infarction) ([place]) 05/2006   PDA percutaneous transluminal coronary angioplasty  . Migraine    "weekly" (04/22/2014)  . N&V (nausea and vomiting)    Chronic. Unclear etiology with multiple admission and ED visits. CT abdomen with  and without contrast (02/2011)  showed no acute process. Gastic Emptying scan (01/2010) was normal. Ultrasound of the abdomen was within normal limits. Hepatitis B viral load was undectable. HIV NR. EGD - gastritis, Hpylori + s/p Rx  . Obesity   . OSA (obstructive sleep apnea)    "suppose to wear mask but I don't" (04/22/2014)  . Peripheral neuropathy   . Pneumonia    "this is probably the 2nd or 3rd time I've had pneumonia" (04/22/2014)  . Recurrent boils   . Seasonal asthma   . Substance abuse   . Thrombocytosis ([place])    Hem/Onc suggested 2/2 chronic hepatits and/or iron deficiency anemia    Past Surgical History:    Procedure Laterality Date  . CESAREAN SECTION  1997  . CORONARY ANGIOPLASTY WITH STENT PLACEMENT  2008   "2 stents"  . ESOPHAGOGASTRODUODENOSCOPY N/A 04/23/2014   Procedure: ESOPHAGOGASTRODUODENOSCOPY (EGD);  Surgeon: Winfield Cunas., MD;  Location: [facility] ENDOSCOPY;  Service: Endoscopy;  Laterality: N/A;   Reviewed with patient, not smoking, no MJ use  reports that she quit smoking about 20 years ago. Her smoking use included Cigarettes. She has never used smokeless tobacco. She reports that she drinks alcohol. She reports that she does not use drugs.  Allergies  Allergen Reactions  . Lisinopril Nausea Only and Swelling  . Morphine And Related Itching and Swelling   Reviewed with patient, reports mom is healthy Family History  Problem Relation Age of Onset  . Diabetes Father     She is not taking any medications, this is a list of previous medications.  Last took over a month ago.  Prior to Admission medications   Medication Sig Start Date End Date Taking? Authorizing Provider  acetaminophen (TYLENOL) 500 MG tablet Take 500 mg by mouth every 6 (six) hours as needed.    Historical Provider, MD  albuterol (PROVENTIL HFA;VENTOLIN HFA) 108 (90 BASE) MCG/ACT inhaler Inhale 2 puffs into the lungs every 4 (four) hours as needed for wheezing or shortness of breath. Patient not taking: Reported on 05/29/2016 04/26/14   Alexa Angela Burke, MD  amLODipine (NORVASC) 10 MG tablet Take 1 tablet (10 mg total) by mouth daily. 05/29/16   Larene Pickett, PA-C  atorvastatin (LIPITOR) 80 MG tablet Take 1 tablet (80 mg total) by mouth daily. 05/29/16   Larene Pickett, PA-C  ferrous sulfate 325 (65 FE) MG tablet Take 1 tablet (325 mg total) by mouth daily. 05/29/16   Larene Pickett, PA-C  insulin aspart (NOVOLOG) 100 UNIT/ML injection Inject 1-6 Units into the skin 3 (three) times daily before meals. 05/29/16   Larene Pickett, PA-C  insulin glargine (LANTUS) 100 unit/mL SOPN Inject 0.1 mLs (10 Units total) into the skin  at bedtime. 05/29/16   Larene Pickett, PA-C  losartan (COZAAR) 25 MG tablet Take 1 tablet (25 mg total) by mouth daily. 05/29/16   Larene Pickett, PA-C  metoCLOPramide (REGLAN) 10 MG tablet Take 1 tablet (10 mg total) by mouth every 6 (six) hours. 07/27/16   Ozella Almond Ward, PA-C  ondansetron (ZOFRAN ODT) 8 MG disintegrating tablet Take 1 tablet (8 mg total) by mouth every 8 (eight) hours as needed for nausea. 07/18/16   Varney Biles, MD  pantoprazole (PROTONIX) 40 MG tablet Take 1 tablet (40 mg total) by mouth daily. 05/29/16   Larene Pickett, PA-C  phenazopyridine (PYRIDIUM) 200 MG tablet Take 1 tablet (200 mg total) by mouth 3 (three) times daily with meals.  Patient not taking: Reported on 05/29/2016 03/20/16   Reyne Dumas, MD  promethazine (PHENERGAN) 25 MG suppository Place 1 suppository (25 mg total) rectally every 6 (six) hours as needed for nausea. 07/18/16   Varney Biles, MD  traMADol (ULTRAM) 50 MG tablet Take 1 tablet (50 mg total) by mouth every 6 (six) hours as needed. Patient not taking: Reported on 05/29/2016 03/05/16   Milton Ferguson, MD    Physical Exam: Vitals:   08/11/16 0130 08/11/16 0258 08/11/16 0306 08/11/16 0354  BP: (!) 195/111 (!) 163/114  (!) 160/111  Pulse: (!) 123   (!) 122  Resp:  17  18  Temp:  98.6 F (37 C)  98.8 F (37.1 C)  TempSrc:  Axillary  Axillary  SpO2: 99% 100%  100%  Weight:   220 lb 0.3 oz (99.8 kg)   Height:   5\' 7"  (1.702 m)     Constitutional: Lying in bed, appears uncomfortable when awake, falls asleep during interview.  Vitals:   08/11/16 0130 08/11/16 0258 08/11/16 0306 08/11/16 0354  BP: (!) 195/111 (!) 163/114  (!) 160/111  Pulse: (!) 123   (!) 122  Resp:  17  18  Temp:  98.6 F (37 C)  98.8 F (37.1 C)  TempSrc:  Axillary  Axillary  SpO2: 99% 100%  100%  Weight:   220 lb 0.3 oz (99.8 kg)   Height:   5\' 7"  (1.702 m)    Eyes: lids and conjunctivae normal, anicteric sclerae ENMT: Mucous membranes are dry. Normal dentition.  Neck:  normal, supple Respiratory: clear to auscultation bilaterally, no wheezing, no crackles. Normal respiratory effort.  Cardiovascular: tachycardic rate and regular rhythm, no murmurs / rubs / gallops. No extremity edema. 2+ pedal pulses.  Abdomen: TTP in epigastrium mostly, but also diffusely tender, +BS, non distended Musculoskeletal: no clubbing / cyanosis.  Normal muscle tone.  Skin: no rashes, lesions, ulcers.  Psychiatric: Alert and oriented, somnolent but awoke to voice and answered questions appropriately Neuro: constant shaking when awake, moving extremities equally.   Labs on Admission: I have personally reviewed following labs and imaging studies  CBC:  Recent Labs Lab 08/10/16 1755 08/11/16 0320  WBC 12.7* 15.4*  HGB 7.8* 8.3*  HCT 25.2* 26.6*  MCV 60.6* 60.9*  PLT 413* 829*   Basic Metabolic Panel:  Recent Labs Lab 08/10/16 1755 08/11/16 0320  NA 134* 131*  K 3.7 3.8  CL 103 97*  CO2 21* 19*  GLUCOSE 170* 208*  BUN 11 8  CREATININE 0.86 0.85  CALCIUM 9.0 9.1   GFR: Estimated Creatinine Clearance: 104.7 mL/min (by C-G formula based on SCr of 0.85 mg/dL). Liver Function Tests:  Recent Labs Lab 08/10/16 1755  AST 19  ALT 8*  ALKPHOS 54  BILITOT 0.2*  PROT 7.6  ALBUMIN 3.3*    Recent Labs Lab 08/10/16 1755  LIPASE 25   No results for input(s): AMMONIA in the last 168 hours. Coagulation Profile: No results for input(s): INR, PROTIME in the last 168 hours. Cardiac Enzymes: No results for input(s): CKTOTAL, CKMB, CKMBINDEX, TROPONINI in the last 168 hours. BNP (last 3 results) No results for input(s): PROBNP in the last 8760 hours. HbA1C: No results for input(s): HGBA1C in the last 72 hours. CBG:  Recent Labs Lab 08/11/16 0400  GLUCAP 196*   Lipid Profile: No results for input(s): CHOL, HDL, LDLCALC, TRIG, CHOLHDL, LDLDIRECT in the last 72 hours. Thyroid Function Tests: No results for input(s): TSH, T4TOTAL, FREET4, T3FREE, THYROIDAB  in  the last 72 hours. Anemia Panel:  Recent Labs  08/11/16 0320  FERRITIN 5*  TIBC 445  IRON 20*   Urine analysis:    Component Value Date/Time   COLORURINE YELLOW 08/10/2016 1857   APPEARANCEUR CLOUDY (A) 08/10/2016 1857   LABSPEC 1.015 08/10/2016 1857   PHURINE 8.0 08/10/2016 1857   GLUCOSEU 50 (A) 08/10/2016 1857   GLUCOSEU > 1000 mg/dL (A) 07/07/2008 2115   HGBUR NEGATIVE 08/10/2016 1857   HGBUR negative 08/12/2008 1444   BILIRUBINUR NEGATIVE 08/10/2016 1857   BILIRUBINUR Negative 02/18/2014 1134   KETONESUR NEGATIVE 08/10/2016 1857   PROTEINUR 30 (A) 08/10/2016 1857   UROBILINOGEN 0.2 11/11/2014 1658   NITRITE NEGATIVE 08/10/2016 1857   LEUKOCYTESUR NEGATIVE 08/10/2016 1857    Radiological Exams on Admission: No results found.    Assessment/Plan Intractable nausea and vomiting 2/2 Diabetic gastroparesis - Difficult situation, she has no PCP at the moment and has been out of medications since February - She is supposed to be on Reglan/Zofran but has not been taking - In ED, multiple modalities attempted without improvement  - IV reglan TID  - IV protonix Daily - Nausea control with IV zofran - IV diphenhydramine for pro-kinetic effect - CSW consult for assistance with medications, PCP follow up - NS at 150cc/hr - Check Magnesium - Persistent tachycardia due to volume depletion, nausea, possibly acute infection - monitor on telemetry, IVF as noted above.     Poorly controlled type 2 diabetes mellitus with peripheral neuropathy - Last A1C from October last year was 7.7 - SSI  - Lantus 10 units QHS - CBG monitoring - Nausea control as noted above - Check A1C    Accelerated hypertension - Not on any medications currently, so likely chronic - Start low dose clonidine patch 0.1mg  given nausea, vomiting - PRN hydralazine and labetalol IV    Iron deficiency anemia - MCV very low at 60 - Check iron and ferritin, though anticipate they will be low - Consider IV  iron once she improves    History of leukocytosis - WBC elevated today, chronically elevated possibly related to acute vomiting - Check differential - Follow trend  Hyponatremia - 134 on admission, down to 131, likely due to volume loss - Trend, NS at 150cc/hr  Dysuria with worsening acidosis - She has a worsening acidosis and complains of urinary symptoms - Initial UA did not look convincing for UTI - Repeat UA/UC, monitor for fever - Check lactic acid - Consider empiric treatment if she spikes a fever   DVT prophylaxis: Lovenox Code Status: Full Disposition Plan: anticipate d/c in 2-3 days Consults called: None Admission status: inpatient, telemetry   Gilles Chiquito MD [facility] Pager [phone]  If 7PM-7AM, please contact night-coverage [email or web address] Password [email or web address]  08/11/2016, 5:21 AM

## 2016-08-12 LAB — GLUCOSE, CAPILLARY
GLUCOSE-CAPILLARY: 195 mg/dL — AB (ref 65–99)
GLUCOSE-CAPILLARY: 139 mg/dL — AB (ref 65–99)
Glucose-Capillary: 106 mg/dL — ABNORMAL HIGH (ref 65–99)
Glucose-Capillary: 155 mg/dL — ABNORMAL HIGH (ref 65–99)
Glucose-Capillary: 202 mg/dL — ABNORMAL HIGH (ref 65–99)
Glucose-Capillary: 233 mg/dL — ABNORMAL HIGH (ref 65–99)
Glucose-Capillary: 82 mg/dL (ref 65–99)
Glucose-Capillary: 91 mg/dL (ref 65–99)

## 2016-08-12 LAB — BASIC METABOLIC PANEL
ANION GAP: 9 (ref 5–15)
BUN: 15 mg/dL (ref 6–20)
CALCIUM: 8.4 mg/dL — AB (ref 8.9–10.3)
CO2: 22 mmol/L (ref 22–32)
CREATININE: 1.15 mg/dL — AB (ref 0.44–1.00)
Chloride: 104 mmol/L (ref 101–111)
GFR calc Af Amer: >60 mL/min (ref >60)
GFR, EST NON AFRICAN AMERICAN: 58 mL/min — AB (ref >60)
GLUCOSE: 82 mg/dL (ref 65–99)
Potassium: 3.7 mmol/L (ref 3.5–5.1)
Sodium: 135 mmol/L (ref 135–145)

## 2016-08-12 LAB — URINE CULTURE

## 2016-08-12 MED ORDER — AMLODIPINE BESYLATE 10 MG PO TABS
10.0000 mg | ORAL_TABLET | Freq: Every day | ORAL | Status: DC
  Administered 2016-08-12 – 2016-08-14 (×3): 10 mg via ORAL
  Filled 2016-08-12 (×3): qty 1

## 2016-08-12 MED ORDER — PANTOPRAZOLE SODIUM 40 MG PO TBEC
40.0000 mg | DELAYED_RELEASE_TABLET | Freq: Every day | ORAL | Status: DC
  Administered 2016-08-12 – 2016-08-14 (×3): 40 mg via ORAL
  Filled 2016-08-12 (×3): qty 1

## 2016-08-12 MED ORDER — PRAVASTATIN SODIUM 40 MG PO TABS
40.0000 mg | ORAL_TABLET | Freq: Every day | ORAL | Status: DC
Start: 2016-08-12 — End: 2016-08-14
  Administered 2016-08-12 – 2016-08-13 (×2): 40 mg via ORAL
  Filled 2016-08-12 (×2): qty 1

## 2016-08-12 MED ORDER — ATORVASTATIN CALCIUM 80 MG PO TABS: 80.0000 mg | ORAL_TABLET | Freq: Every day | ORAL | Status: DC

## 2016-08-12 MED ORDER — SODIUM CHLORIDE 0.9 % IV SOLN
510.0000 mg | INTRAVENOUS | Status: DC
  Administered 2016-08-12: 510 mg via INTRAVENOUS
  Filled 2016-08-12: qty 17

## 2016-08-12 MED ORDER — WHITE PETROLATUM GEL
Status: AC
  Administered 2016-08-12: 12:00:00
  Filled 2016-08-12: qty 1

## 2016-08-12 MED ORDER — METOPROLOL TARTRATE 25 MG PO TABS
25.0000 mg | ORAL_TABLET | Freq: Two times a day (BID) | ORAL | Status: DC
  Administered 2016-08-12 – 2016-08-14 (×4): 25 mg via ORAL
  Filled 2016-08-12 (×5): qty 1

## 2016-08-12 MED ORDER — ASPIRIN 81 MG PO CHEW
81.0000 mg | CHEWABLE_TABLET | Freq: Every day | ORAL | Status: DC
  Administered 2016-08-12 – 2016-08-14 (×3): 81 mg via ORAL
  Filled 2016-08-12 (×3): qty 1

## 2016-08-12 NOTE — Progress Notes (Signed)
MEDICATION RELATED CONSULT NOTE - INITIAL   Pharmacy Consult for IV iron Indication: Iron-deficiency anemia  Allergies  Allergen Reactions  . Lisinopril Nausea Only and Swelling  . Morphine And Related Itching and Swelling    Patient Measurements: Height: 5\' 7"  (170.2 cm) Weight: 220 lb 0.3 oz (99.8 kg) IBW/kg (Calculated) : 61.6  Vital Signs: Temp: 98.3 F (36.8 C) (04/21 0800) Temp Source: Oral (04/21 0800) BP: 104/63 (04/21 0800) Pulse Rate: 107 (04/21 0420) Intake/Output from previous day: 04/20 0701 - 04/21 0700 In: 1357.5 [P.O.:120; I.V.:1237.5] Out: 200 [Urine:200] Intake/Output from this shift: No intake/output data recorded.  Labs:  Recent Labs  08/10/16 1755 08/11/16 0320 08/11/16 0522 08/12/16 0348  WBC 12.7* 15.4*  --   --   HGB 7.8* 8.3*  --   --   HCT 25.2* 26.6*  --   --   PLT 413* 403*  --   --   CREATININE 0.86 0.85  --  1.15*  MG  --   --  1.5*  --   ALBUMIN 3.3*  --   --   --   PROT 7.6  --   --   --   AST 19  --   --   --   ALT 8*  --   --   --   ALKPHOS 54  --   --   --   BILITOT 0.2*  --   --   --    Estimated Creatinine Clearance: 77.4 mL/min (A) (by C-G formula based on SCr of 1.15 mg/dL (H)).     Assessment: 41 yo female with iron deficiency anemia. Pharmacy has been consulted to dose IV iron. HgB today is 8.3 and iron 20. No allergies noted to iron products.   Plan:  Feraheme 510 mg X 1 today and then again in 72 hours for iron repletion Monitor iron stores and CBC Monitor for any infusion reactions   Ihor Austin, PharmD PGY1 Pharmacy Resident Pager: [phone] 08/12/2016,8:35 AM

## 2016-08-12 NOTE — Progress Notes (Signed)
NURSING PROGRESS NOTE  Shirley [name] [id number] Transfer Data: 08/12/2016 7:23 PM Attending Provider: Jonetta Osgood, MD PCP:No PCP Per Patient Code Status: Full  Shirley [name] is a 41 y.o. female patient transferred from [place] -No acute distress noted.  -No complaints of shortness of breath.  -No complaints of chest pain.   Cardiac Monitoring: Box # 7 in place. Cardiac monitor yields: ST in 100's  Blood pressure 120/71, pulse (!) 112, temperature 99.7 F (37.6 C), temperature source Oral, resp. rate 15, height 5\' 7"  (1.702 m), weight 99.8 kg (220 lb 0.3 oz), last menstrual period 07/12/2016, SpO2 100 %.   IV Fluids:  IV in place, occlusive dsg intact without redness, saline locked   Allergies:  Lisinopril and Morphine and related  Past Medical History:   has a past medical history of Abscess of tunica vaginalis; Anxiety; Blood dyscrasia; CAD (coronary artery disease) (06/15/2006); CVA (cerebral vascular accident) ([place]) (~ 02/2014); CVA (cerebral vascular accident) ([place]); Depression; Diabetes mellitus type 2, uncontrolled, with complications ([place]); Fibromyalgia; Gastritis; Gastroparesis; GERD (gastroesophageal reflux disease); Hepatitis B, chronic ([place]); History of pyelonephritis; Hyperlipidemia; Hypertension; Iron deficiency anemia; Irregular menses; MI (myocardial infarction) ([place]) (05/2006); Migraine; N&V (nausea and vomiting); Obesity; OSA (obstructive sleep apnea); Peripheral neuropathy; Pneumonia; Recurrent boils; Seasonal asthma; Substance abuse; and Thrombocytosis ([place]).  Past Surgical History:   has a past surgical history that includes Cesarean section (1997); Coronary angioplasty with stent (2008); and Esophagogastroduodenoscopy (N/A, 04/23/2014).  Social History:   reports that she quit smoking about 20 years ago. Her smoking use included Cigarettes. She has never used smokeless tobacco. She reports that she drinks alcohol. She reports that she does not use  drugs.  Skin: intact  Patient/Family orientated to room. Information packet given to patient/family. Admission inpatient armband information verified with patient/family to include name and date of birth and placed on patient arm. Side rails up x 2, fall assessment and education completed with patient/family. Patient/family able to verbalize understanding of risk associated with falls and verbalized understanding to call for assistance before getting out of bed. Call light within reach. Patient/family able to voice and demonstrate understanding of unit orientation instructions.    Will continue to evaluate and treat per MD orders.

## 2016-08-12 NOTE — Progress Notes (Signed)
PROGRESS NOTE        PATIENT DETAILS Name: Shirley [name] Age: 41 y.o. Sex: female Date of Birth: 03-11-1976 Admit Date: 08/10/2016 Admitting Physician Sid Falcon, MD PCP:No PCP Per Patient  Brief Narrative: Patient is a 41 y.o. female with history of insulin-dependent type 2 diabetes, hypertension, CVA, CAD-noncompliant to medications (she last took any of her medications approximately 2 months back) admitted for evaluation of intractable nausea and vomiting. See below for further details.  Subjective: Claims she feels much better-slight nausea-but no vomiting since yesterday. She does not like to clear liquids and wants to eat regular food.  No BM yesterday-but claims she is passing gas-did not want to try enema  Telemetry-personally reviewed-no further sinus tachycardia-sinus rhythm this morning.  Assessment/Plan: Intractable nausea with vomiting likely secondary to gastroparesis flare: Much improved with scheduled Reglan and as needed Phenergan. Abdominal exam continues to be benign. She wants to try a regular food today-we will go order a diabetic/heart healthy diet with small portions. She is notoriously noncompliant to medications and follow-up-and unfortunately for gastroparesis to be adequately controlled she needs tight glycemic control.  Hypertension: Blood pressure is significantly improved-continue IV metoprolol and transdermal clonidine for now-if she continues to tolerate oral intake-will switch to oral medications.   Chronic iron deficiency anemia: Anemia panel consistent with iron deficiency, will start IV iron today. Suspect she will not be compliant oral medications. Continue to follow hemoglobin.   Insulin-dependent type 2 diabetes: Noncompliant-supposed insulin as an outpatient-CBGs stable with 10 units of Lantus and sliding scale regimen. Once diet has stabilized further-we will switch to insulin 70/30-which probably will be much more  affordable to the patient.  History of CAD: No anginal symptoms-resume antiplatelet agents.  History of CVA: Claims she had CVA with left-sided deficits in 2015-no focal deficits-resume antiplatelet agents.  Dyslipidemia: Resume statin.  GERD: Continue PPI-but changed to oral route.  Hypomagnesemia: Repleted-likely secondary to GI loss.   Noncompliance to medications: Claims she last took her medications 2 months back-continued counseling done today as well. Appreciate case management.  DVT Prophylaxis: Prophylactic Lovenox   Code Status: Full code   Family Communication: None at bedside  Disposition Plan: Remain inpatient-transfer to [facility] unit today.  Antimicrobial agents: Anti-infectives    None     Procedures: None  CONSULTS:  None  Time spent: 24- minutes-Greater than 50% of this time was spent in counseling, explanation of diagnosis, planning of further management, and coordination of care.  MEDICATIONS: Scheduled Meds: . bisacodyl  10 mg Rectal Once  . chlorhexidine  15 mL Mouth Rinse BID  . cloNIDine  0.1 mg Transdermal Weekly  . enoxaparin (LOVENOX) injection  40 mg Subcutaneous Q24H  . insulin aspart  0-15 Units Subcutaneous Q4H  . insulin glargine  10 Units Subcutaneous QHS  . mouth rinse  15 mL Mouth Rinse q12n4p  . metoCLOPramide (REGLAN) injection  10 mg Intravenous TID AC & HS  . metoprolol  5 mg Intravenous Q6H  . pantoprazole (PROTONIX) IV  40 mg Intravenous Q24H  . sodium chloride flush  3 mL Intravenous Q12H  . sodium phosphate  1 enema Rectal Once   Continuous Infusions: . sodium chloride 75 mL/hr at 08/12/16 0700   PRN Meds:.diphenhydrAMINE, hydrALAZINE, [DISCONTINUED] ondansetron **OR** ondansetron (ZOFRAN) IV, promethazine   PHYSICAL EXAM: Vital signs: Vitals:   08/11/16 1549 08/11/16  2049 08/12/16 0100 08/12/16 0420  BP: 122/90 117/79 102/67 116/73  Pulse: (!) 105 (!) 101 (!) 119 (!) 107  Resp:  12 19 13   Temp:  99.5 F  (37.5 C) 99.2 F (37.3 C) 99.2 F (37.3 C)  TempSrc:  Oral Oral Oral  SpO2: 100% 100% 98% 98%  Weight:      Height:       Filed Weights   08/11/16 0306  Weight: 99.8 kg (220 lb 0.3 oz)   Body mass index is 34.46 kg/m.   General appearance :Awake, alert, not in any distress.  Eyes:, pupils equally reactive to light and accomodation,no scleral icterus. HEENT: Atraumatic and Normocephalic Neck: supple, no JVD. No cervical lymphadenopathy.  Resp:Good air entry bilaterally, no added sounds  CVS: S1 S2 regular, no murmurs.  GI: Bowel sounds present, Non tender and not distended with no gaurding, rigidity or rebound.No organomegaly Extremities: B/L Lower Ext shows no edema, both legs are warm to touch Neurology:  speech clear,Non focal, sensation is grossly intact. Psychiatric: Normal judgment and insight. Alert and oriented x 3.  Musculoskeletal:No digital cyanosis Skin:No Rash, warm and dry Wounds:N/A  I have personally reviewed following labs and imaging studies  LABORATORY DATA: CBC:  Recent Labs Lab 08/10/16 1755 08/11/16 0320  WBC 12.7* 15.4*  NEUTROABS  --  13.9*  HGB 7.8* 8.3*  HCT 25.2* 26.6*  MCV 60.6* 60.9*  PLT 413* 403*    Basic Metabolic Panel:  Recent Labs Lab 08/10/16 1755 08/11/16 0320 08/11/16 0522 08/12/16 0348  NA 134* 131*  --  135  K 3.7 3.8  --  3.7  CL 103 97*  --  104  CO2 21* 19*  --  22  GLUCOSE 170* 208*  --  82  BUN 11 8  --  15  CREATININE 0.86 0.85  --  1.15*  CALCIUM 9.0 9.1  --  8.4*  MG  --   --  1.5*  --     GFR: Estimated Creatinine Clearance: 77.4 mL/min (A) (by C-G formula based on SCr of 1.15 mg/dL (H)).  Liver Function Tests:  Recent Labs Lab 08/10/16 1755  AST 19  ALT 8*  ALKPHOS 54  BILITOT 0.2*  PROT 7.6  ALBUMIN 3.3*    Recent Labs Lab 08/10/16 1755  LIPASE 25   No results for input(s): AMMONIA in the last 168 hours.  Coagulation Profile: No results for input(s): INR, PROTIME in the last 168  hours.  Cardiac Enzymes: No results for input(s): CKTOTAL, CKMB, CKMBINDEX, TROPONINI in the last 168 hours.  BNP (last 3 results) No results for input(s): PROBNP in the last 8760 hours.  HbA1C: No results for input(s): HGBA1C in the last 72 hours.  CBG:  Recent Labs Lab 08/11/16 1144 08/11/16 1548 08/11/16 2050 08/12/16 0059 08/12/16 0419  GLUCAP 172* 142* 121* 82 91    Lipid Profile: No results for input(s): CHOL, HDL, LDLCALC, TRIG, CHOLHDL, LDLDIRECT in the last 72 hours.  Thyroid Function Tests: No results for input(s): TSH, T4TOTAL, FREET4, T3FREE, THYROIDAB in the last 72 hours.  Anemia Panel:  Recent Labs  08/11/16 0320  FERRITIN 5*  TIBC 445  IRON 20*    Urine analysis:    Component Value Date/Time   COLORURINE YELLOW 08/10/2016 1857   APPEARANCEUR CLOUDY (A) 08/10/2016 1857   LABSPEC 1.015 08/10/2016 1857   PHURINE 8.0 08/10/2016 1857   GLUCOSEU 50 (A) 08/10/2016 1857   GLUCOSEU > 1000 mg/dL (A) 07/07/2008 2115  HGBUR NEGATIVE 08/10/2016 1857   HGBUR negative 08/12/2008 1444   BILIRUBINUR NEGATIVE 08/10/2016 1857   BILIRUBINUR Negative 02/18/2014 1134   [place] 08/10/2016 1857   PROTEINUR 30 (A) 08/10/2016 1857   UROBILINOGEN 0.2 11/11/2014 1658   NITRITE NEGATIVE 08/10/2016 1857   LEUKOCYTESUR NEGATIVE 08/10/2016 1857    Sepsis Labs: Lactic Acid, Venous    Component Value Date/Time   LATICACIDVEN 1.08 01/09/2016 1638    MICROBIOLOGY: Recent Results (from the past 240 hour(s))  MRSA PCR Screening     Status: None   Collection Time: 08/11/16  3:10 AM  Result Value Ref Range Status   MRSA by PCR NEGATIVE NEGATIVE Final    Comment:        The GeneXpert MRSA Assay (FDA approved for NASAL specimens only), is one component of a comprehensive MRSA colonization surveillance program. It is not intended to diagnose MRSA infection nor to guide or monitor treatment for MRSA infections.     RADIOLOGY STUDIES/RESULTS: Dg  Abd 2 Views  Result Date: 08/11/2016 CLINICAL DATA:  Nausea, no bowel complaints.Left sided abdominal painHx of DM, iron deficiency anemia, nausea and vomiting, coronary artery disease, pyelonephritis, Hep B, peripheral neuropathy, PNA, gastroparesis EXAM: ABDOMEN - 2 VIEW COMPARISON:  04/07/2016. FINDINGS: Normal bowel gas pattern.  No free air. Mild increased stool throughout the colon. No evidence of renal or ureteral stones. Soft tissues are unremarkable. Skeletal structures are unremarkable. IMPRESSION: 1. No acute findings.  No evidence of bowel obstruction. 2. Mild increase in colonic stool burden. Electronically Signed   By: Lajean Manes M.D.   On: 08/11/2016 10:13     LOS: 1 day   Oren Binet, MD  [facility] Pager:[phone]  If 7PM-7AM, please contact night-coverage [email or web address] Password [email or web address] 08/12/2016, 8:10 AM

## 2016-08-13 LAB — GLUCOSE, CAPILLARY
GLUCOSE-CAPILLARY: 273 mg/dL — AB (ref 65–99)
GLUCOSE-CAPILLARY: 197 mg/dL — AB (ref 65–99)
GLUCOSE-CAPILLARY: 134 mg/dL — AB (ref 65–99)
Glucose-Capillary: 102 mg/dL — ABNORMAL HIGH (ref 65–99)
Glucose-Capillary: 188 mg/dL — ABNORMAL HIGH (ref 65–99)
Glucose-Capillary: 310 mg/dL — ABNORMAL HIGH (ref 65–99)

## 2016-08-13 MED ORDER — INSULIN ASPART PROT & ASPART (70-30 MIX) 100 UNIT/ML ~~LOC~~ SUSP
15.0000 [IU] | Freq: Two times a day (BID) | SUBCUTANEOUS | Status: DC
  Administered 2016-08-13 – 2016-08-14 (×3): 15 [IU] via SUBCUTANEOUS
  Filled 2016-08-13: qty 10

## 2016-08-13 MED ORDER — ACETAMINOPHEN 325 MG PO TABS
650.0000 mg | ORAL_TABLET | Freq: Four times a day (QID) | ORAL | Status: DC | PRN
  Administered 2016-08-13 – 2016-08-14 (×3): 650 mg via ORAL
  Filled 2016-08-13 (×3): qty 2

## 2016-08-13 NOTE — Progress Notes (Signed)
PROGRESS NOTE        PATIENT DETAILS Name: Shirley [name] Age: 41 y.o. Sex: female Date of Birth: 02/10/1976 Admit Date: 08/10/2016 Admitting Physician Sid Falcon, MD PCP:No PCP Per Patient  Brief Narrative: Patient is a 41 y.o. female with history of insulin-dependent type 2 diabetes, hypertension, CVA, CAD-noncompliant to medications (she last took any of her medications approximately 2 months back) admitted for evaluation of intractable nausea and vomiting. See below for further details.  Subjective: Overall much better-claims that she started her menses-and whenever her menses start-she usually has a flare of vomiting and nausea. She otherwise appears well. Denies any chest pain.   Telemetry-personally slight sinus tachycardia in the low 100s.  Assessment/Plan: Intractable nausea with vomiting likely secondary to gastroparesis flare: Difficulty improved with scheduled Reglan and as needed Phenergan. Benign abdominal exam. Tolerating regular diet. Monitor for 1 additional day, if improvement continues home on 4/23. Counseled regarding importance of compliance to a small portion diet. She is notoriously noncompliant to medications and follow-up-and unfortunately for gastroparesis to be adequately controlled she needs tight glycemic control.  Hypertension: Controlled, continue metoprolol and amlodipine.  Chronic iron deficiency anemia: Anemia panel consistent with iron deficiency, given 1 dose of IV iron on 4/21. Will require oral iron supplementation on discharge.   Insulin-dependent type 2 diabetes: Noncompliant-supposed insulin as an outpatient-CBGs stable with insulin 70/3015 units twice a day. Extensive counseling done regarding importance of compliance.  History of CAD: No anginal continue antiplatelet agents.   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  Dyslipidemia: Today statin    GERD: Continue PPI   Hypomagnesemia: Repleted.   Noncompliance to medications: Claims she last took her medications 2 months back-continued counseling done today as well. Appreciate case management input  DVT Prophylaxis: Prophylactic Lovenox   Code Status: Full code   Family Communication: None at bedside  Disposition Plan: Remain patient- home on 4/23  Antimicrobial agents: Anti-infectives    None     Procedures: None  CONSULTS:  None  Time spent: 24- minutes-Greater than 50% of this time was spent in counseling, explanation of diagnosis, planning of further management, and coordination of care.  MEDICATIONS: Scheduled Meds: . amLODipine  10 mg Oral Daily  . aspirin  81 mg Oral Daily  . bisacodyl  10 mg Rectal Once  . chlorhexidine  15 mL Mouth Rinse BID  . enoxaparin (LOVENOX) injection  40 mg Subcutaneous Q24H  . insulin aspart  0-15 Units Subcutaneous Q4H  . insulin aspart protamine- aspart  15 Units Subcutaneous BID WC  . mouth rinse  15 mL Mouth Rinse q12n4p  . metoCLOPramide (REGLAN) injection  10 mg Intravenous TID AC & HS  . metoprolol tartrate  25 mg Oral BID  . pantoprazole  40 mg Oral Q1200  . pravastatin  40 mg Oral q1800  . sodium chloride flush  3 mL Intravenous Q12H  . sodium phosphate  1 enema Rectal Once   Continuous Infusions: . sodium chloride 10 mL/hr at 08/12/16 0819  . ferumoxytol Stopped (08/12/16 1056)   PRN Meds:.acetaminophen, diphenhydrAMINE, hydrALAZINE, [DISCONTINUED] ondansetron **OR** ondansetron (ZOFRAN) IV, promethazine   PHYSICAL EXAM: Vital signs: Vitals:   08/12/16 2051 08/12/16 2243 08/13/16 0608 08/13/16 0815  BP: 134/84 123/77 115/72 110/68  Pulse: (!) 109 100 (!) 104 Mar[place] Kitchen)  112  Resp: 18  19   Temp: 98.8 F (37.1 C)  98.4 F (36.9 C)   TempSrc: Oral  Oral   SpO2: 100%  98%   Weight:      Height:       Filed Weights   08/11/16 0306  Weight: 99.8 kg (220 lb 0.3 oz)   Body mass index is 34.46 kg/m.    General appearance :Awake, alert, not in any distress.  Eyes:, pupils equally reactive to light and accomodation HEENT: Atraumatic and Normocephalic Neck: supple, no JVD. No cervical lymphadenopathy.  Resp:Good air entry bilaterally, no added sounds  CVS: S1 S2 regular, no murmurs.  GI: Bowel sounds present, Non tender and not distended with no gaurding, rigidity or rebound.No organomegaly Extremities: B/L Lower Ext shows no edema, both legs are warm to touch Neurology:  speech clear,Non focal, sensation is grossly intact. Psychiatric: Normal judgment and insight.  Musculoskeletal:No digital cyanosis Skin:No Rash, warm and dry Wounds:N/A  I have personally reviewed following labs and imaging studies  LABORATORY DATA: CBC:  Recent Labs Lab 08/10/16 1755 08/11/16 0320  WBC 12.7* 15.4*  NEUTROABS  --  13.9*  HGB 7.8* 8.3*  HCT 25.2* 26.6*  MCV 60.6* 60.9*  PLT 413* 403*    Basic Metabolic Panel:  Recent Labs Lab 08/10/16 1755 08/11/16 0320 08/11/16 0522 08/12/16 0348  NA 134* 131*  --  135  K 3.7 3.8  --  3.7  CL 103 97*  --  104  CO2 21* 19*  --  22  GLUCOSE 170* 208*  --  82  BUN 11 8  --  15  CREATININE 0.86 0.85  --  1.15*  CALCIUM 9.0 9.1  --  8.4*  MG  --   --  1.5*  --     GFR: Estimated Creatinine Clearance: 77.4 mL/min (A) (by C-G formula based on SCr of 1.15 mg/dL (H)).  Liver Function Tests:  Recent Labs Lab 08/10/16 1755  AST 19  ALT 8*  ALKPHOS 54  BILITOT 0.2*  PROT 7.6  ALBUMIN 3.3*    Recent Labs Lab 08/10/16 1755  LIPASE 25   No results for input(s): AMMONIA in the last 168 hours.  Coagulation Profile: No results for input(s): INR, PROTIME in the last 168 hours.  Cardiac Enzymes: No results for input(s): CKTOTAL, CKMB, CKMBINDEX, TROPONINI in the last 168 hours.  BNP (last 3 results) No results for input(s): PROBNP in the last 8760 hours.  HbA1C: No results for input(s): HGBA1C in the last 72 hours.  CBG:  Recent  Labs Lab 08/12/16 2215 08/12/16 2350 08/13/16 0328 08/13/16 0807 08/13/16 1247  GLUCAP 139* 188* 273* 197* 102*    Lipid Profile: No results for input(s): CHOL, HDL, LDLCALC, TRIG, CHOLHDL, LDLDIRECT in the last 72 hours.  Thyroid Function Tests: No results for input(s): TSH, T4TOTAL, FREET4, T3FREE, THYROIDAB in the last 72 hours.  Anemia Panel:  Recent Labs  08/11/16 0320  FERRITIN 5*  TIBC 445  IRON 20*    Urine analysis:    Component Value Date/Time   COLORURINE YELLOW 08/10/2016 1857   APPEARANCEUR CLOUDY (A) 08/10/2016 1857   LABSPEC 1.015 08/10/2016 1857   PHURINE 8.0 08/10/2016 1857   GLUCOSEU 50 (A) 08/10/2016 1857   GLUCOSEU > 1000 mg/dL (A) 07/07/2008 2115   HGBUR NEGATIVE 08/10/2016 1857   HGBUR negative 08/12/2008 1444   BILIRUBINUR NEGATIVE 08/10/2016 1857   BILIRUBINUR Negative 02/18/2014 [place] 08/10/2016 1857  PROTEINUR 30 (A) 08/10/2016 1857   UROBILINOGEN 0.2 11/11/2014 1658   NITRITE NEGATIVE 08/10/2016 1857   LEUKOCYTESUR NEGATIVE 08/10/2016 1857    Sepsis Labs: Lactic Acid, Venous    Component Value Date/Time   LATICACIDVEN 1.08 01/09/2016 1638    MICROBIOLOGY: Recent Results (from the past 240 hour(s))  Culture, Urine     Status: Abnormal   Collection Time: 08/10/16  6:57 PM  Result Value Ref Range Status   Specimen Description URINE, RANDOM  Final   Special Requests NONE  Final   Culture MULTIPLE SPECIES PRESENT, SUGGEST RECOLLECTION (A)  Final   Report Status 08/12/2016 FINAL  Final  MRSA PCR Screening     Status: None   Collection Time: 08/11/16  3:10 AM  Result Value Ref Range Status   MRSA by PCR NEGATIVE NEGATIVE Final    Comment:        The GeneXpert MRSA Assay (FDA approved for NASAL specimens only), is one component of a comprehensive MRSA colonization surveillance program. It is not intended to diagnose MRSA infection nor to guide or monitor treatment for MRSA infections.     RADIOLOGY  STUDIES/RESULTS: Dg Abd 2 Views  Result Date: 08/11/2016 CLINICAL DATA:  Nausea, no bowel complaints.Left sided abdominal painHx of DM, iron deficiency anemia, nausea and vomiting, coronary artery disease, pyelonephritis, Hep B, peripheral neuropathy, PNA, gastroparesis EXAM: ABDOMEN - 2 VIEW COMPARISON:  04/07/2016. FINDINGS: Normal bowel gas pattern.  No free air. Mild increased stool throughout the colon. No evidence of renal or ureteral stones. Soft tissues are unremarkable. Skeletal structures are unremarkable. IMPRESSION: 1. No acute findings.  No evidence of bowel obstruction. 2. Mild increase in colonic stool burden. Electronically Signed   By: Lajean Manes M.D.   On: 08/11/2016 10:13     LOS: 2 days   Oren Binet, MD  [facility] Pager:[phone]  If 7PM-7AM, please contact night-coverage [email or web address] Password [facility] 08/13/2016, 2:34 PM

## 2016-08-14 LAB — GLUCOSE, CAPILLARY
GLUCOSE-CAPILLARY: 170 mg/dL — AB (ref 65–99)
GLUCOSE-CAPILLARY: 256 mg/dL — AB (ref 65–99)
Glucose-Capillary: 119 mg/dL — ABNORMAL HIGH (ref 65–99)
Glucose-Capillary: 145 mg/dL — ABNORMAL HIGH (ref 65–99)

## 2016-08-14 MED ORDER — GLUCOSE BLOOD VI STRP: ORAL_STRIP | 0 refills | Status: DC

## 2016-08-14 MED ORDER — INSULIN SYRINGE 28G X 1/2" 0.5 ML MISC
0 refills | Status: DC
Start: 2016-08-14 — End: 2016-09-05

## 2016-08-14 MED ORDER — METOCLOPRAMIDE HCL 10 MG PO TABS: 10.0000 mg | ORAL_TABLET | Freq: Three times a day (TID) | ORAL | 0 refills | Status: DC

## 2016-08-14 MED ORDER — INSULIN NPH ISOPHANE & REGULAR (70-30) 100 UNIT/ML ~~LOC~~ SUSP: SUBCUTANEOUS | 0 refills | Status: DC

## 2016-08-14 MED ORDER — TRIAMTERENE-HCTZ 50-25 MG PO CAPS: 1.0000 | ORAL_CAPSULE | ORAL | 0 refills | Status: DC

## 2016-08-14 MED ORDER — TRAMADOL HCL 50 MG PO TABS
50.0000 mg | ORAL_TABLET | Freq: Four times a day (QID) | ORAL | Status: DC | PRN
  Administered 2016-08-14: 50 mg via ORAL
  Filled 2016-08-14: qty 1

## 2016-08-14 MED ORDER — KETOROLAC TROMETHAMINE 30 MG/ML IJ SOLN
30.0000 mg | Freq: Once | INTRAMUSCULAR | Status: AC
  Administered 2016-08-14: 30 mg via INTRAVENOUS
  Filled 2016-08-14: qty 1

## 2016-08-14 MED ORDER — OMEPRAZOLE 40 MG PO CPDR: 40.0000 mg | DELAYED_RELEASE_CAPSULE | Freq: Every day | ORAL | 0 refills | Status: DC

## 2016-08-14 MED ORDER — FERROUS SULFATE 325 (65 FE) MG PO TABS: 325.0000 mg | ORAL_TABLET | Freq: Two times a day (BID) | ORAL | 0 refills | Status: DC

## 2016-08-14 MED ORDER — FREESTYLE LANCETS MISC: 0 refills | Status: DC

## 2016-08-14 MED ORDER — METOPROLOL TARTRATE 50 MG PO TABS: 50.0000 mg | ORAL_TABLET | Freq: Two times a day (BID) | ORAL | 0 refills | Status: DC

## 2016-08-14 MED ORDER — ALBUTEROL SULFATE HFA 108 (90 BASE) MCG/ACT IN AERS: 2.0000 | INHALATION_SPRAY | RESPIRATORY_TRACT | 0 refills | Status: DC | PRN

## 2016-08-14 MED ORDER — LOVASTATIN 20 MG PO TABS: 20.0000 mg | ORAL_TABLET | Freq: Every day | ORAL | 0 refills | Status: DC

## 2016-08-14 MED ORDER — FERROUS SULFATE 325 (65 FE) MG PO TABS
325.0000 mg | ORAL_TABLET | Freq: Two times a day (BID) | ORAL | Status: DC
  Administered 2016-08-14: 325 mg via ORAL
  Filled 2016-08-14: qty 1

## 2016-08-14 MED ORDER — PROMETHAZINE HCL 25 MG RE SUPP: 25.0000 mg | Freq: Four times a day (QID) | RECTAL | 0 refills | Status: DC | PRN

## 2016-08-14 MED ORDER — FREESTYLE SYSTEM KIT: 1.0000 | PACK | Freq: Three times a day (TID) | 1 refills | Status: DC

## 2016-08-14 NOTE — Care Management Note (Signed)
Case Management Note  Patient Details  Name: Shirley [name] MRN: [id number] Date of Birth: Sep 09, 1975  Subjective/Objective:   Gastroparesis, DM, HTN                 Action/Plan: Discharge Planning: NCM spoke to pt and discussed the importance of compliance with medical regimen ie taking meds as prescribed, follow up at appt with PCP, and checking blood sugar at home. Provided pt with [facility] letter (override completed by CM AD). Pt states she did not use last admission 06/2016 because she could not afford meds. Appt arranged at [facility] on 08/21/2016 at 4 pm. [facility] and they will do a one time copay override for meds with her [facility] letter. Pt states she has applied for disability and has been denied x3. Explained to pt the importance of compliance and having a PCP to be considered for disability.    Expected Discharge Date:  08/14/16               Expected Discharge Plan:  Home/Self Care  In-House Referral:  NA  Discharge planning Services  CM Consult, Follow-up appt scheduled, Medication Assistance, [place] [facility]  Post Acute Care Choice:  NA Choice offered to:  NA  DME Arranged:  N/A DME Agency:  NA  HH Arranged:  NA HH Agency:  NA  Status of Service:  Completed, signed off  If discussed at [place] of Stay Meetings, dates discussed:    Additional Comments:  Erenest Rasher, RN 08/14/2016, 11:19 AM

## 2016-08-14 NOTE — Discharge Summary (Signed)
PATIENT DETAILS Name: Shirley [name] Age: 41 y.o. Sex: female Date of Birth: [birth year] MRN: [id number]. Admitting Physician: Sid Falcon, MD PCP:No PCP Per Patient  Admit Date: 08/10/2016 Discharge date: 08/14/2016  Recommendations for Outpatient Follow-up:  1. Follow up with PCP in 1-2 weeks 2. Please obtain BMP/CBC in one week 3. Please continue counseling regarding compliance to medications  Admitted From:  Home  Disposition: [place]: No  Equipment/Devices: None  Discharge Condition: Stable  CODE STATUS: FULL CODE  Diet recommendation:  Heart Healthy / Carb Modified  Brief Summary: See H&P, Labs, Consult and Test reports for all details in brief,Patient is a 41 y.o. female with history of insulin-dependent type 2 diabetes, hypertension, CVA, CAD-noncompliant to medications (she last took any of her medications approximately 2 months back) admitted for evaluation of intractable nausea and vomiting. See below for further details.  Brief Hospital Course: Intractable nausea with vomiting likely secondary to gastroparesis flare:  significantly improved-no further vomiting. Managed with intravenous Reglan and as needed other antiemetics. Benign physical exam, abdominal x-ray did not show any acute abnormalities. Counseled regarding importance of small portion diet, and compliance with Reglan and insulin. Emphasized need for optimal diabetic control to control her underlying gastroparesis. By day of discharge, benign exam-tolerating diet without any vomiting. Stable for discharge for close follow-up with primary care M.D.   Hypertension: Controlled, continue metoprolol and diuretics on discharge.  Chronic iron deficiency anemia: Anemia panel consistent with iron deficiency, given 1 dose of IV iron on 4/21 while inpatient, started oral supplementation on discharge. Please continue to follow CBC closely.  Insulin-dependent type 2 diabetes:  Noncompliant-supposed insulin as an outpatient-CBGs stable with insulin 70/30 15 units twice a day. Extensive counseling done regarding importance of compliance. I have prescribed her NPH/regular insulin-explained that this is available at significant lower cost at [facility].  History of CAD: No anginal continue antiplatelet agents.   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  Dyslipidemia:  continue statin-changed to lovastatin-available on $4 list at [facility].  GERD: Continue PPI   Hypomagnesemia: Repleted.   Noncompliance to medications: Claims she last took her medications 2 months back-continued counseling done today as well. Appreciate case management input-who will arrange for follow-up at [place]. I have tried to make sure that most of her medications are on the $4 list at [facility].   Procedures/Studies: None  Discharge Diagnoses:  Active Problems:   Poorly controlled type 2 diabetes mellitus with peripheral neuropathy (HCC)   Essential hypertension   Iron deficiency anemia   History of leukocytosis   Intractable vomiting   Diabetic gastroparesis associated with type 2 diabetes mellitus (HCC)   Diabetic gastroparesis (HCC)   Intractable nausea and vomiting   Discharge Instructions:  Activity:  As tolerated   Discharge Instructions    Diet - low sodium heart healthy    Complete by:  As directed    Discharge instructions    Complete by:  As directed    Follow with Primary MD  In 1 week  Please keep a log of your CBG's and take it to your PCP at next visit  Please be compliant with medications and follow up appointment.  Please get a complete blood count and chemistry panel checked by your Primary MD at your next visit, and again as instructed by your Primary MD.  Get Medicines reviewed and adjusted: Please take all your medications  with you for your next visit with  your Primary MD  Laboratory/radiological data: Please request your Primary MD to go over all hospital tests and procedure/radiological results at the follow up, please ask your Primary MD to get all Hospital records sent to his/her office.  In some cases, they will be blood work, cultures and biopsy results pending at the time of your discharge. Please request that your primary care M.D. follows up on these results.  Also Note the following: If you experience worsening of your admission symptoms, develop shortness of breath, life threatening emergency, suicidal or homicidal thoughts you must seek medical attention immediately by calling 911 or calling your MD immediately  if symptoms less severe.  You must read complete instructions/literature along with all the possible adverse reactions/side effects for all the Medicines you take and that have been prescribed to you. Take any new Medicines after you have completely understood and accpet all the possible adverse reactions/side effects.   Do not drive when taking Pain medications or sleeping medications (Benzodaizepines)  Do not take more than prescribed Pain, Sleep and Anxiety Medications. It is not advisable to combine anxiety,sleep and pain medications without talking with your primary care practitioner  Special Instructions: If you have smoked or chewed Tobacco  in the last 2 yrs please stop smoking, stop any regular Alcohol  and or any Recreational drug use.  Wear Seat belts while driving.  Please note: You were cared for by a hospitalist during your hospital stay. Once you are discharged, your primary care physician will handle any further medical issues. Please note that NO REFILLS for any discharge medications will be authorized once you are discharged, as it is imperative that you return to your primary care physician (or establish a relationship with a primary care physician if you do not have one) for your post hospital discharge  needs so that they can reassess your need for medications and monitor your lab values.   Increase activity slowly    Complete by:  As directed      Allergies as of 08/14/2016      Reactions   Lisinopril Nausea Only, Swelling   Morphine And Related Itching, Swelling      Medication List    STOP taking these medications   amLODipine 10 MG tablet Commonly known as:  NORVASC   atorvastatin 80 MG tablet Commonly known as:  LIPITOR   insulin aspart 100 UNIT/ML injection Commonly known as:  NOVOLOG   insulin glargine 100 unit/mL Sopn Commonly known as:  LANTUS   losartan 25 MG tablet Commonly known as:  COZAAR   ondansetron 8 MG disintegrating tablet Commonly known as:  ZOFRAN ODT   pantoprazole 40 MG tablet Commonly known as:  PROTONIX   phenazopyridine 200 MG tablet Commonly known as:  PYRIDIUM   traMADol 50 MG tablet Commonly known as:  ULTRAM     TAKE these medications   acetaminophen 500 MG tablet Commonly known as:  TYLENOL Take 500 mg by mouth every 6 (six) hours as needed.   albuterol 108 (90 Base) MCG/ACT inhaler Commonly known as:  PROVENTIL HFA;VENTOLIN HFA Inhale 2 puffs into the lungs every 4 (four) hours as needed for wheezing or shortness of breath.   ferrous sulfate 325 (65 FE) MG tablet Take 1 tablet (325 mg total) by mouth 2 (two) times daily with a meal. What changed:  when to take this   freestyle lancets Use as instructed   glucose blood test strip Commonly  known as:  FREESTYLE TEST STRIPS Use as instructed   glucose monitoring kit monitoring kit 1 each by Does not apply route 4 (four) times daily - after meals and at bedtime. 1 month Diabetic Testing Supplies for QAC-QHS accuchecks.   insulin NPH-regular Human (70-30) 100 UNIT/ML injection Commonly known as:  NOVOLIN 70/30 15 units in the morning and 15 units in the evening.  Please provide 1 month supply   INSULIN SYRINGE .5CC/28G 28G X 1/2" 0.5 ML Misc Commonly known as:  INS  SYRINGE/NEEDLE .5CC/28G Please provide 1 month supply   lovastatin 20 MG tablet Commonly known as:  MEVACOR Take 1 tablet (20 mg total) by mouth at bedtime.   metoCLOPramide 10 MG tablet Commonly known as:  REGLAN Take 1 tablet (10 mg total) by mouth 4 (four) times daily -  before meals and at bedtime. What changed:  when to take this   metoprolol 50 MG tablet Commonly known as:  LOPRESSOR Take 1 tablet (50 mg total) by mouth 2 (two) times daily.   omeprazole 40 MG capsule Commonly known as:  PRILOSEC Take 1 capsule (40 mg total) by mouth daily.   promethazine 25 MG suppository Commonly known as:  PHENERGAN Place 1 suppository (25 mg total) rectally every 6 (six) hours as needed for refractory nausea / vomiting. What changed:  reasons to take this   triamterene-hydrochlorothiazide 50-25 MG capsule Commonly known as:  DYAZIDE Take 1 capsule by mouth every morning.      Follow-up [place] Follow up.   Why:  please call to make a hospital follow up apt Monday 4/23 at [place] information: [place] [id number] [phone]         Allergies  Allergen Reactions  . Lisinopril Nausea Only and Swelling  . Morphine And Related Itching and Swelling    Consultations:   None  Other Procedures/Studies: Dg Abd 2 Views  Result Date: 08/11/2016 CLINICAL DATA:  Nausea, no bowel complaints.Left sided abdominal painHx of DM, iron deficiency anemia, nausea and vomiting, coronary artery disease, pyelonephritis, Hep B, peripheral neuropathy, PNA, gastroparesis EXAM: ABDOMEN - 2 VIEW COMPARISON:  04/07/2016. FINDINGS: Normal bowel gas pattern.  No free air. Mild increased stool throughout the colon. No evidence of renal or ureteral stones. Soft tissues are unremarkable. Skeletal structures are unremarkable. IMPRESSION: 1. No acute findings.  No evidence of bowel obstruction. 2. Mild increase in  colonic stool burden. Electronically Signed   By: Lajean Manes M.D.   On: 08/11/2016 10:13      TODAY-DAY OF DISCHARGE:  Subjective:   Makensey Messerschmidt today has no headache,no chest abdominal pain,no new weakness tingling or numbness, feels much better wants to go home today. She continues to have regular pelvic pain from her usual menses. Otherwise she is stable without any vomiting.  Objective:   Blood pressure (!) 156/91, pulse 96, temperature 99.5 F (37.5 C), temperature source Oral, resp. rate 18, height 5' 7"  (1.702 m), weight 99.8 kg (220 lb 0.3 oz), last menstrual period 07/12/2016, SpO2 100 %.  Intake/Output Summary (Last 24 hours) at 08/14/16 0956 Last data filed at 08/14/16 0930  Gross per 24 hour  Intake              243 ml  Output             2500 ml  Net            -  2257 ml   Filed Weights   08/11/16 0306  Weight: 99.8 kg (220 lb 0.3 oz)    Exam: Awake Alert, Oriented *3, No new F.N deficits, Normal affect [place].AT,PERRAL Supple Neck,No JVD, No cervical lymphadenopathy appriciated.  Symmetrical Chest wall movement, Good air movement bilaterally, CTAB RRR,No Gallops,Rubs or new Murmurs, No Parasternal Heave +ve B.Sounds, Abd Soft, Non tender, No organomegaly appriciated, No rebound -guarding or rigidity. No Cyanosis, Clubbing or edema, No new Rash or bruise   PERTINENT RADIOLOGIC STUDIES: Dg Abd 2 Views  Result Date: 08/11/2016 CLINICAL DATA:  Nausea, no bowel complaints.Left sided abdominal painHx of DM, iron deficiency anemia, nausea and vomiting, coronary artery disease, pyelonephritis, Hep B, peripheral neuropathy, PNA, gastroparesis EXAM: ABDOMEN - 2 VIEW COMPARISON:  04/07/2016. FINDINGS: Normal bowel gas pattern.  No free air. Mild increased stool throughout the colon. No evidence of renal or ureteral stones. Soft tissues are unremarkable. Skeletal structures are unremarkable. IMPRESSION: 1. No acute findings.  No evidence of bowel obstruction. 2. Mild  increase in colonic stool burden. Electronically Signed   By: Lajean Manes M.D.   On: 08/11/2016 10:13     PERTINENT LAB RESULTS: CBC: No results for input(s): WBC, HGB, HCT, PLT in the last 72 hours. CMET CMP     Component Value Date/Time   NA 135 08/12/2016 0348   K 3.7 08/12/2016 0348   CL 104 08/12/2016 0348   CO2 22 08/12/2016 0348   GLUCOSE 82 08/12/2016 0348   BUN 15 08/12/2016 0348   CREATININE 1.15 (H) 08/12/2016 0348   CREATININE 0.76 07/15/2012 1419   CALCIUM 8.4 (L) 08/12/2016 0348   PROT 7.6 08/10/2016 1755   ALBUMIN 3.3 (L) 08/10/2016 1755   AST 19 08/10/2016 1755   ALT 8 (L) 08/10/2016 1755   ALKPHOS 54 08/10/2016 1755   BILITOT 0.2 (L) 08/10/2016 1755   GFRNONAA 58 (L) 08/12/2016 0348   GFRAA >60 08/12/2016 0348    GFR Estimated Creatinine Clearance: 77.4 mL/min (A) (by C-G formula based on SCr of 1.15 mg/dL (H)). No results for input(s): LIPASE, AMYLASE in the last 72 hours. No results for input(s): CKTOTAL, CKMB, CKMBINDEX, TROPONINI in the last 72 hours. Invalid input(s): POCBNP No results for input(s): DDIMER in the last 72 hours. No results for input(s): HGBA1C in the last 72 hours. No results for input(s): CHOL, HDL, LDLCALC, TRIG, CHOLHDL, LDLDIRECT in the last 72 hours. No results for input(s): TSH, T4TOTAL, T3FREE, THYROIDAB in the last 72 hours.  Invalid input(s): FREET3 No results for input(s): VITAMINB12, FOLATE, FERRITIN, TIBC, IRON, RETICCTPCT in the last 72 hours. Coags: No results for input(s): INR in the last 72 hours.  Invalid input(s): PT Microbiology: Recent Results (from the past 240 hour(s))  Culture, Urine     Status: Abnormal   Collection Time: 08/10/16  6:57 PM  Result Value Ref Range Status   Specimen Description URINE, RANDOM  Final   Special Requests NONE  Final   Culture MULTIPLE SPECIES PRESENT, SUGGEST RECOLLECTION (A)  Final   Report Status 08/12/2016 FINAL  Final  MRSA PCR Screening     Status: None   Collection  Time: 08/11/16  3:10 AM  Result Value Ref Range Status   MRSA by PCR NEGATIVE NEGATIVE Final    Comment:        The GeneXpert MRSA Assay (FDA approved for NASAL specimens only), is one component of a comprehensive MRSA colonization surveillance program. It is not intended to diagnose MRSA infection nor to  guide or monitor treatment for MRSA infections.     FURTHER DISCHARGE INSTRUCTIONS:  Get Medicines reviewed and adjusted: Please take all your medications with you for your next visit with your Primary MD  Laboratory/radiological data: Please request your Primary MD to go over all hospital tests and procedure/radiological results at the follow up, please ask your Primary MD to get all Hospital records sent to his/her office.  In some cases, they will be blood work, cultures and biopsy results pending at the time of your discharge. Please request that your primary care M.D. goes through all the records of your hospital data and follows up on these results.  Also Note the following: If you experience worsening of your admission symptoms, develop shortness of breath, life threatening emergency, suicidal or homicidal thoughts you must seek medical attention immediately by calling 911 or calling your MD immediately  if symptoms less severe.  You must read complete instructions/literature along with all the possible adverse reactions/side effects for all the Medicines you take and that have been prescribed to you. Take any new Medicines after you have completely understood and accpet all the possible adverse reactions/side effects.   Do not drive when taking Pain medications or sleeping medications (Benzodaizepines)  Do not take more than prescribed Pain, Sleep and Anxiety Medications. It is not advisable to combine anxiety,sleep and pain medications without talking with your primary care practitioner  Special Instructions: If you have smoked or chewed Tobacco  in the last 2 yrs please  stop smoking, stop any regular Alcohol  and or any Recreational drug use.  Wear Seat belts while driving.  Please note: You were cared for by a hospitalist during your hospital stay. Once you are discharged, your primary care physician will handle any further medical issues. Please note that NO REFILLS for any discharge medications will be authorized once you are discharged, as it is imperative that you return to your primary care physician (or establish a relationship with a primary care physician if you do not have one) for your post hospital discharge needs so that they can reassess your need for medications and monitor your lab values.  Total Time spent coordinating discharge including counseling, education and face to face time equals  45 minutes.  SignedOren Binet 08/14/2016 9:56 AM

## 2016-08-14 NOTE — Progress Notes (Signed)
Paged Schorr,NP about patient complaining of 10/10 menstrual abdominal pain. Schorr,NP gave order for PRN q6 hours 50mg  tramadol. RN gave med to patient at 4. Rechecked patient's level and patient was asleep upon RN entering room at 0240. Upon entering patient's room again this morning patient stated she was still having 10/10 menstrual abdominal pain and that now she is having body aches. Patient stated,"This pain happens every single month because of my period.This happens all the time." RN paged Schorr,NP and Schorr,NP placed one time order for 30mg  of Toradol at 7AM. RN notified dayshift RN Georgina Peer D about this new one time medication.

## 2016-08-21 ENCOUNTER — Inpatient Hospital Stay (INDEPENDENT_AMBULATORY_CARE_PROVIDER_SITE_OTHER): Payer: Self-pay | Admitting: Physician Assistant

## 2016-09-05 ENCOUNTER — Ambulatory Visit (INDEPENDENT_AMBULATORY_CARE_PROVIDER_SITE_OTHER): Payer: Self-pay | Admitting: Physician Assistant

## 2016-09-05 ENCOUNTER — Encounter (INDEPENDENT_AMBULATORY_CARE_PROVIDER_SITE_OTHER): Payer: Self-pay | Admitting: Physician Assistant

## 2016-09-05 VITALS — BP 138/91 | HR 84 | Temp 97.7°F | Ht 66.0 in | Wt 227.2 lb

## 2016-09-05 DIAGNOSIS — K3184 Gastroparesis: Secondary | ICD-10-CM

## 2016-09-05 DIAGNOSIS — K30 Functional dyspepsia: Secondary | ICD-10-CM

## 2016-09-05 DIAGNOSIS — J45909 Unspecified asthma, uncomplicated: Secondary | ICD-10-CM

## 2016-09-05 DIAGNOSIS — E1143 Type 2 diabetes mellitus with diabetic autonomic (poly)neuropathy: Secondary | ICD-10-CM

## 2016-09-05 DIAGNOSIS — D509 Iron deficiency anemia, unspecified: Secondary | ICD-10-CM

## 2016-09-05 DIAGNOSIS — R739 Hyperglycemia, unspecified: Secondary | ICD-10-CM

## 2016-09-05 DIAGNOSIS — I10 Essential (primary) hypertension: Secondary | ICD-10-CM

## 2016-09-05 DIAGNOSIS — E1169 Type 2 diabetes mellitus with other specified complication: Secondary | ICD-10-CM

## 2016-09-05 LAB — GLUCOSE, POCT (MANUAL RESULT ENTRY): POC Glucose: 272 mg/dL — AB (ref 70–99)

## 2016-09-05 LAB — POCT GLYCOSYLATED HEMOGLOBIN (HGB A1C): Hemoglobin A1C: 8.6

## 2016-09-05 MED ORDER — "INSULIN SYRINGE 28G X 1/2"" 0.5 ML MISC": 11 refills | Status: DC

## 2016-09-05 MED ORDER — FERROUS SULFATE 325 (65 FE) MG PO TABS: 325.0000 mg | ORAL_TABLET | Freq: Two times a day (BID) | ORAL | 0 refills | Status: DC

## 2016-09-05 MED ORDER — METOCLOPRAMIDE HCL 10 MG PO TABS: 10.0000 mg | ORAL_TABLET | Freq: Three times a day (TID) | ORAL | 1 refills | Status: DC

## 2016-09-05 MED ORDER — ASPIRIN EC 81 MG PO TBEC
81.0000 mg | DELAYED_RELEASE_TABLET | Freq: Every day | ORAL | 3 refills | Status: DC
Start: 2016-09-05 — End: 2016-09-27

## 2016-09-05 MED ORDER — METOPROLOL TARTRATE 50 MG PO TABS: 50.0000 mg | ORAL_TABLET | Freq: Two times a day (BID) | ORAL | 1 refills | Status: DC

## 2016-09-05 MED ORDER — PROMETHAZINE HCL 25 MG RE SUPP: 25.0000 mg | Freq: Four times a day (QID) | RECTAL | 0 refills | Status: DC | PRN

## 2016-09-05 MED ORDER — LOVASTATIN 20 MG PO TABS: 20.0000 mg | ORAL_TABLET | Freq: Every day | ORAL | 1 refills | Status: DC

## 2016-09-05 MED ORDER — OMEPRAZOLE 40 MG PO CPDR: 40.0000 mg | DELAYED_RELEASE_CAPSULE | Freq: Every day | ORAL | 2 refills | Status: DC

## 2016-09-05 MED ORDER — LOSARTAN POTASSIUM 50 MG PO TABS: 50.0000 mg | ORAL_TABLET | Freq: Every day | ORAL | 5 refills | Status: DC

## 2016-09-05 MED ORDER — HYDROCHLOROTHIAZIDE 25 MG PO TABS: 25.0000 mg | ORAL_TABLET | Freq: Every day | ORAL | 1 refills | Status: DC

## 2016-09-05 MED ORDER — ALBUTEROL SULFATE HFA 108 (90 BASE) MCG/ACT IN AERS: 2.0000 | INHALATION_SPRAY | RESPIRATORY_TRACT | 5 refills | Status: DC | PRN

## 2016-09-05 MED ORDER — FREESTYLE SYSTEM KIT: 1.0000 | PACK | Freq: Three times a day (TID) | 1 refills | Status: DC

## 2016-09-05 MED ORDER — GLUCOSE BLOOD VI STRP: ORAL_STRIP | 11 refills | Status: DC

## 2016-09-05 MED ORDER — FREESTYLE LANCETS MISC: 11 refills | Status: DC

## 2016-09-05 MED ORDER — INSULIN NPH ISOPHANE & REGULAR (70-30) 100 UNIT/ML ~~LOC~~ SUSP: SUBCUTANEOUS | 11 refills | Status: DC

## 2016-09-05 MED FILL — ?LOVASTATIN 20MG TABLET: 20 | 30 days supply | Qty: 30 | Fill #0

## 2016-09-05 MED FILL — !TRUE METRIX BLOOD GLUCOSE: 365 days supply | Qty: 1 | Fill #0

## 2016-09-05 MED FILL — LOSARTAN POTASSIUM 50 MG TA: 50 | 30 days supply | Qty: 30 | Fill #0

## 2016-09-05 MED FILL — TRUE METRIX TEST STRIP: 25 days supply | Qty: 100 | Fill #0

## 2016-09-05 MED FILL — TRUEplus LANCETS 28G MISC: 25 days supply | Qty: 100 | Fill #0

## 2016-09-05 MED FILL — FERROUS SULFATE 325 MG TAB: 325 (65 FE) | 30 days supply | Qty: 60 | Fill #0

## 2016-09-05 NOTE — Patient Instructions (Signed)
Gastroparesis Gastroparesis, also called delayed gastric emptying, is a condition in which food takes longer than normal to empty from the stomach. The condition is usually long-lasting (chronic). What are the causes? This condition may be caused by:  An endocrine disorder, such as hypothyroidism or diabetes. Diabetes is the most common cause of this condition.  A nervous system disease, such as Parkinson disease or multiple sclerosis.  Cancer, infection, or surgery of the stomach or vagus nerve.  A connective tissue disorder, such as scleroderma.  Certain medicines. In most cases, the cause is not known. What increases the risk? This condition is more likely to develop in:  People with certain disorders, including endocrine disorders, eating disorders, amyloidosis, and scleroderma.  People with certain diseases, including Parkinson disease or multiple sclerosis.  People with cancer or infection of the stomach or vagus nerve.  People who have had surgery on the stomach or vagus nerve.  People who take certain medicines.  Women. What are the signs or symptoms? Symptoms of this condition include:  An early feeling of fullness when eating.  Nausea.  Weight loss.  Vomiting.  Heartburn.  Abdominal bloating.  Inconsistent blood glucose levels.  Lack of appetite.  Acid from the stomach coming up into the esophagus (gastroesophageal reflux).  Spasms of the stomach. Symptoms may come and go. How is this diagnosed? This condition is diagnosed with tests, such as:  Tests that check how long it takes food to move through the stomach and intestines. These tests include:  Upper gastrointestinal (GI) series. In this test, X-rays of the intestines are taken after you drink a liquid. The liquid makes the intestines show up better on the X-rays.  Gastric emptying scintigraphy. In this test, scans are taken after you eat food that contains a small amount of radioactive  material.  Wireless capsule GI monitoring system. This test involves swallowing a capsule that records information about movement through the stomach.  Gastric manometry. This test measures electrical and muscular activity in the stomach. It is done with a thin tube that is passed down the throat and into the stomach.  Endoscopy. This test checks for abnormalities in the lining of the stomach. It is done with a long, thin tube that is passed down the throat and into the stomach.  An ultrasound. This test can help rule out gallbladder disease or pancreatitis as a cause of your symptoms. It uses sound waves to take pictures of the inside of your body. How is this treated? There is no cure for gastroparesis. This condition may be managed with:  Treatment of the underlying condition causing the gastroparesis.  Lifestyle changes, including exercise and dietary changes. Dietary changes can include:  Changes in what and when you eat.  Eating smaller meals more often.  Eating low-fat foods.  Eating low-fiber forms of high-fiber foods, such as cooked vegetables instead of raw vegetables.  Having liquid foods in place of solid foods. Liquid foods are easier to digest.  Medicines. These may be given to control nausea and vomiting and to stimulate stomach muscles.  Getting food through a feeding tube. This may be done in severe cases.  A gastric neurostimulator. This is a device that is inserted into the body with surgery. It helps improve stomach emptying and control nausea and vomiting. Follow these instructions at home:  Follow your health care provider's instructions about exercise and diet.  Take medicines only as directed by your health care provider. Contact a health care provider if:  Your symptoms do not improve with treatment.  You have new symptoms. Get help right away if:  You have severe abdominal pain that does not improve with treatment.  You have nausea that does not  go away.  You cannot keep fluids down. This information is not intended to replace advice given to you by your health care provider. Make sure you discuss any questions you have with your health care provider. Document Released: 04/10/2005 Document Revised: 09/16/2015 Document Reviewed: 04/06/2014 Elsevier Interactive Patient Education  2017 [employer].

## 2016-09-05 NOTE — Progress Notes (Signed)
Subjective:  Patient ID: Shirley [name], female    DOB: [birth year]  Age: 41 y.o. MRN: [id number]  CC: gastroparesis  HPI Shirley [name] is a 41 y.o. female with a PMH of multiple comorbids,  Gastroparesis, DM2, CVA, depression, cocaine use, and iron deficiency anemia. Presents on f/u of hospital admission from 08/11/16 to 4/213/18. Diagnosed with diabetic gastroparesis, poorly controlled DM2, HTN, and iron deficiency anemia. She is currently feeling ill as she has not been able to fill her medications due to finances. Says she is awaiting financial assistance paperwork to be approved. She endorses nausea without vomiting, gastroparesis, polydipsia, polyuria, fatigue, tingling, numbness of hands and feet, LE edema, and visual blurring.  Denies CP, palpitations, SOB, HA, weakness, paralysis, or rash.     Outpatient Medications Prior to Visit  Medication Sig Dispense Refill  . acetaminophen (TYLENOL) 500 MG tablet Take 500 mg by mouth every 6 (six) hours as needed.    Marland Kitchen albuterol (PROVENTIL HFA;VENTOLIN HFA) 108 (90 Base) MCG/ACT inhaler Inhale 2 puffs into the lungs every 4 (four) hours as needed for wheezing or shortness of breath. 1 Inhaler 0  . ferrous sulfate 325 (65 FE) MG tablet Take 1 tablet (325 mg total) by mouth 2 (two) times daily with a meal. 60 tablet 0  . glucose blood (FREESTYLE TEST STRIPS) test strip Use as instructed 100 each 0  . glucose monitoring kit (FREESTYLE) monitoring kit 1 each by Does not apply route 4 (four) times daily - after meals and at bedtime. 1 month Diabetic Testing Supplies for QAC-QHS accuchecks. 1 each 1  . insulin NPH-regular Human (NOVOLIN 70/30) (70-30) 100 UNIT/ML injection 15 units in the morning and 15 units in the evening.  Please provide 1 month supply 20 mL 0  . INSULIN SYRINGE .5CC/28G (INS SYRINGE/NEEDLE .5CC/28G) 28G X 1/2" 0.5 ML MISC Please provide 1 month supply 100 each 0  . Lancets (FREESTYLE) lancets Use as instructed 100 each 0  .  lovastatin (MEVACOR) 20 MG tablet Take 1 tablet (20 mg total) by mouth at bedtime. 30 tablet 0  . metoCLOPramide (REGLAN) 10 MG tablet Take 1 tablet (10 mg total) by mouth 4 (four) times daily -  before meals and at bedtime. 120 tablet 0  . metoprolol tartrate (LOPRESSOR) 50 MG tablet Take 1 tablet (50 mg total) by mouth 2 (two) times daily. 60 tablet 0  . omeprazole (PRILOSEC) 40 MG capsule Take 1 capsule (40 mg total) by mouth daily. 30 capsule 0  . promethazine (PHENERGAN) 25 MG suppository Place 1 suppository (25 mg total) rectally every 6 (six) hours as needed for refractory nausea / vomiting. 30 each 0  . triamterene-hydrochlorothiazide (DYAZIDE) 50-25 MG capsule Take 1 capsule by mouth every morning. 30 capsule 0   No facility-administered medications prior to visit.      ROS Review of Systems  Constitutional: Negative for chills, fever and malaise/fatigue.  Eyes: Positive for blurred vision.  Respiratory: Negative for shortness of breath.   Cardiovascular: Positive for leg swelling. Negative for chest pain and palpitations.  Gastrointestinal: Negative for abdominal pain and nausea.  Genitourinary: Negative for dysuria and hematuria.  Musculoskeletal: Negative for joint pain and myalgias.  Skin: Negative for rash.  Neurological: Positive for tingling. Negative for headaches.  Endo/Heme/Allergies: Positive for polydipsia.  Psychiatric/Behavioral: Negative for depression. The patient is not nervous/anxious.     Objective:  BP (!) 138/91   Pulse 84   Temp 97.7 F (36.5 C) (Oral)   Ht  _0  (1.676 m)   Wt 227 lb 3.2 oz (103.1 kg)   LMP 08/09/2016 (Exact Date)   SpO2 100%   BMI 36.67 kg/m   BP/Weight 09/05/2016 08/14/2016 1/61/0960  Systolic BP 454 098 -  Diastolic BP 91 91 -  Wt. (Lbs) 227.2 - 220.02  BMI 36.67 - 34.46      Physical Exam  Constitutional: She is oriented to person, place, and time.  Well developed, overweight, NAD, polite  HENT:  Head: Normocephalic  and atraumatic.  Eyes: No scleral icterus.  Neck: Normal range of motion.  Cardiovascular: Normal rate, regular rhythm and normal heart sounds.   Pulmonary/Chest: Effort normal and breath sounds normal.  Abdominal: Soft. Bowel sounds are normal. There is tenderness (mild generalized TTP in all quadrants).  Musculoskeletal: She exhibits no edema.  Neurological: She is alert and oriented to person, place, and time. No cranial nerve deficit. Coordination normal.  Skin: Skin is warm and dry. No rash noted. No erythema. No pallor.  Psychiatric: She has a normal mood and affect. Her behavior is normal. Thought content normal.  Vitals reviewed.    Assessment & Plan:   1. Type 2 diabetes mellitus with other specified complication, without long-term current use of insulin (HCC) - Begin glucose monitoring kit (FREESTYLE) monitoring kit; 1 each by Does not apply route 4 (four) times daily - after meals and at bedtime. 1 month Diabetic Testing Supplies for QAC-QHS accuchecks.  Dispense: 1 each; Refill: 1 - Begn insulin NPH-regular Human (NOVOLIN 70/30) (70-30) 100 UNIT/ML injection; 15 units in the morning and 15 units in the evening.  Dispense: 10 mL; Refill: 11 - Begin INSULIN SYRINGE .5CC/28G (INS SYRINGE/NEEDLE .5CC/28G) 28G X 1/2" 0.5 ML MISC; Please provide 1 month supply  Dispense: 100 each; Refill: 11 - Begin Lancets (FREESTYLE) lancets; Use as instructed  Dispense: 100 each; Refill: 11 - Begin glucose blood (FREESTYLE TEST STRIPS) test strip; Use as instructed  Dispense: 100 each; Refill: 11  2. Hyperglycemia - HgB A1c 8.6% in clinic  - Glucose (CBG)  3. Diabetic gastroparesis (HCC) - Refill promethazine (PHENERGAN) 25 MG suppository; Place 1 suppository (25 mg total) rectally every 6 (six) hours as needed for refractory nausea / vomiting.  Dispense: 30 each; Refill: 0 - Refill metoCLOPramide (REGLAN) 10 MG tablet; Take 1 tablet (10 mg total) by mouth 4 (four) times daily -  before meals  and at bedtime.  Dispense: 120 tablet; Refill: 1  4. Essential hypertension - Begin hydrochlorothiazide (HYDRODIURIL) 25 MG tablet; Take 1 tablet (25 mg total) by mouth daily. Take on tablet in the morning.  Dispense: 90 tablet; Refill: 1 - losartan (COZAAR) 50 MG tablet; Take 1 tablet (50 mg total) by mouth daily.  Dispense: 30 tablet; Refill: 5 - metoprolol tartrate (LOPRESSOR) 50 MG tablet; Take 1 tablet (50 mg total) by mouth 2 (two) times daily.  Dispense: 180 tablet; Refill: 1 - Refill lovastatin (MEVACOR) 20 MG tablet; Take 1 tablet (20 mg total) by mouth at bedtime.  Dispense: 90 tablet; Refill: 1 - aspirin EC 81 MG tablet; Take 1 tablet (81 mg total) by mouth daily.  Dispense: 90 tablet; Refill: 3  5. Uncomplicated asthma, unspecified asthma severity, unspecified whether persistent - Refill albuterol (PROVENTIL HFA;VENTOLIN HFA) 108 (90 Base) MCG/ACT inhaler; Inhale 2 puffs into the lungs every 4 (four) hours as needed for wheezing or shortness of breath.  Dispense: 1 Inhaler; Refill: 5  6. Iron deficiency anemia, unspecified iron deficiency anemia type - Refill  ferrous sulfate 325 (65 FE) MG tablet; Take 1 tablet (325 mg total) by mouth 2 (two) times daily with a meal.  Dispense: 60 tablet; Refill: 0  7. Acid indigestion - Refill omeprazole (PRILOSEC) 40 MG capsule; Take 1 capsule (40 mg total) by mouth daily.  Dispense: 30 capsule; Refill: 2  * I called the pharmacy at [facility] and was told pt may pick up medications tomorrow afternoon and receive meds free for this one time. I advised pt to pick up meds tomorrow.     Meds ordered this encounter  Medications  . hydrochlorothiazide (HYDRODIURIL) 25 MG tablet    Sig: Take 1 tablet (25 mg total) by mouth daily. Take on tablet in the morning.    Dispense:  90 tablet    Refill:  1    Order Specific Question:   Supervising Provider    Answer:   Tresa Garter [id number]  . losartan (COZAAR) 50 MG tablet    Sig: Take 1 tablet (50  mg total) by mouth daily.    Dispense:  30 tablet    Refill:  5    Order Specific Question:   Supervising Provider    Answer:   Tresa Garter [id number]  . promethazine (PHENERGAN) 25 MG suppository    Sig: Place 1 suppository (25 mg total) rectally every 6 (six) hours as needed for refractory nausea / vomiting.    Dispense:  30 each    Refill:  0    Order Specific Question:   Supervising Provider    Answer:   Tresa Garter [id number]  . omeprazole (PRILOSEC) 40 MG capsule    Sig: Take 1 capsule (40 mg total) by mouth daily.    Dispense:  30 capsule    Refill:  2    Order Specific Question:   Supervising Provider    Answer:   Tresa Garter [id number]  . metoprolol tartrate (LOPRESSOR) 50 MG tablet    Sig: Take 1 tablet (50 mg total) by mouth 2 (two) times daily.    Dispense:  180 tablet    Refill:  1    Order Specific Question:   Supervising Provider    Answer:   Tresa Garter [id number]  . metoCLOPramide (REGLAN) 10 MG tablet    Sig: Take 1 tablet (10 mg total) by mouth 4 (four) times daily -  before meals and at bedtime.    Dispense:  120 tablet    Refill:  1    Order Specific Question:   Supervising Provider    Answer:   Tresa Garter [id number]  . lovastatin (MEVACOR) 20 MG tablet    Sig: Take 1 tablet (20 mg total) by mouth at bedtime.    Dispense:  90 tablet    Refill:  1    Order Specific Question:   Supervising Provider    Answer:   Tresa Garter [id number]  . glucose monitoring kit (FREESTYLE) monitoring kit    Sig: 1 each by Does not apply route 4 (four) times daily - after meals and at bedtime. 1 month Diabetic Testing Supplies for QAC-QHS accuchecks.    Dispense:  1 each    Refill:  1    Substitution allowed    Order Specific Question:   Supervising Provider    Answer:   Tresa Garter [id number]  . insulin NPH-regular Human (NOVOLIN 70/30) (70-30) 100 UNIT/ML injection    Sig: 15 units in the morning and 15 units in the  evening.    Dispense:  10 mL    Refill:  11    Order Specific Question:   Supervising Provider    Answer:   Tresa Garter [id number]  . INSULIN SYRINGE .5CC/28G (INS SYRINGE/NEEDLE .5CC/28G) 28G X 1/2" 0.5 ML MISC    Sig: Please provide 1 month supply    Dispense:  100 each    Refill:  11    Substitution allowed    Order Specific Question:   Supervising Provider    Answer:   Tresa Garter [[id number]]  . Lancets (FREESTYLE) lancets    Sig: Use as instructed    Dispense:  100 each    Refill:  11    Substitution allowed    Order Specific Question:   Supervising Provider    Answer:   Tresa Garter [[id number]]  . glucose blood (FREESTYLE TEST STRIPS) test strip    Sig: Use as instructed    Dispense:  100 each    Refill:  11    Substitution allowed    Order Specific Question:   Supervising Provider    Answer:   Tresa Garter [[id number]]  . ferrous sulfate 325 (65 FE) MG tablet    Sig: Take 1 tablet (325 mg total) by mouth 2 (two) times daily with a meal.    Dispense:  60 tablet    Refill:  0    Order Specific Question:   Supervising Provider    Answer:   Tresa Garter [id number]  . albuterol (PROVENTIL HFA;VENTOLIN HFA) 108 (90 Base) MCG/ACT inhaler    Sig: Inhale 2 puffs into the lungs every 4 (four) hours as needed for wheezing or shortness of breath.    Dispense:  1 Inhaler    Refill:  5    Order Specific Question:   Supervising Provider    Answer:   Tresa Garter [id number]  . aspirin EC 81 MG tablet    Sig: Take 1 tablet (81 mg total) by mouth daily.    Dispense:  90 tablet    Refill:  3    Order Specific Question:   Supervising Provider    Answer:   Tresa Garter [id number]    Follow-up: No Follow-up on file.   Clent Demark PA

## 2016-09-05 NOTE — Progress Notes (Deleted)
Subjective:  Patient ID: Shirley [name], female    DOB: January 20, 1976  Age: 41 y.o. MRN: [id number]  CC:  Chief Complaint  Patient presents with  . New Patient (Initial Visit)  . Hospitalization Follow-up    gastroparesis  . Edema    of feet and legs    HPI Shirley [name] is a 41 y.o. female with a PMH of   Past Medical History:  Diagnosis Date  . Abscess of tunica vaginalis    10/09- Abundant S. aureus- sensitive to all abx  . Anxiety   . Blood dyscrasia   . CAD (coronary artery disease) 06/15/2006   s/p Subendocardial MI with PDA angioplasty(no stent) on 06/15/06 and relook  cath 06/19/06 showed patency of site. Cath 12/10- no restenosis or significant CAD progression  . CVA (cerebral vascular accident) ([place]) ~ 02/2014   denies residual on 04/22/2014  . CVA (cerebral vascular accident) [facility])    history of remote right cerebellar infarct noted on head CT at least since 10/2011  . Depression   . Diabetes mellitus type 2, uncontrolled, with complications ([place])   . Fibromyalgia   . Gastritis   . Gastroparesis    secondary to poorly controlled DM, last emptying study performed 01/2010  was normal but may be falsely positive as pt was on reglan  . GERD (gastroesophageal reflux disease)   . Hepatitis B, chronic (HCC)    Hep BeAb+,Hep B cAb+ & Hep BsAg+ (9/06)  . History of pyelonephritis    H/o GrpB Pyelonephritis (9/06) and UTI- 07/11- E.Coli, 12/10- GBS  . Hyperlipidemia   . Hypertension   . Iron deficiency anemia   . Irregular menses    Small ovarian follicles seen on NW(2/95)  . MI (myocardial infarction) ([place]) 05/2006   PDA percutaneous transluminal coronary angioplasty  . Migraine    "weekly" (04/22/2014)  . N&V (nausea and vomiting)    Chronic. Unclear etiology with multiple admission and ED visits. CT abdomen with and without contrast (02/2011)  showed no acute process. Gastic Emptying scan (01/2010) was normal. Ultrasound of the abdomen was within normal  limits. Hepatitis B viral load was undectable. HIV NR. EGD - gastritis, Hpylori + s/p Rx  . Obesity   . OSA (obstructive sleep apnea)    "suppose to wear mask but I don't" (04/22/2014)  . Peripheral neuropathy   . Pneumonia    "this is probably the 2nd or 3rd time I've had pneumonia" (04/22/2014)  . Recurrent boils   . Seasonal asthma   . Substance abuse   . Thrombocytosis (HCC)    Hem/Onc suggested 2/2 chronic hepatits and/or iron deficiency anemia    that presents with a {Numbers; 6-21:30865} {Time; day/week/month:13537} history of     Outpatient Medications Prior to Visit  Medication Sig Dispense Refill  . acetaminophen (TYLENOL) 500 MG tablet Take 500 mg by mouth every 6 (six) hours as needed.    Marland Kitchen albuterol (PROVENTIL HFA;VENTOLIN HFA) 108 (90 Base) MCG/ACT inhaler Inhale 2 puffs into the lungs every 4 (four) hours as needed for wheezing or shortness of breath. 1 Inhaler 0  . ferrous sulfate 325 (65 FE) MG tablet Take 1 tablet (325 mg total) by mouth 2 (two) times daily with a meal. 60 tablet 0  . glucose blood (FREESTYLE TEST STRIPS) test strip Use as instructed 100 each 0  . glucose monitoring kit (FREESTYLE) monitoring kit 1 each by Does not apply route 4 (four) times daily - after meals  and at bedtime. 1 month Diabetic Testing Supplies for QAC-QHS accuchecks. 1 each 1  . insulin NPH-regular Human (NOVOLIN 70/30) (70-30) 100 UNIT/ML injection 15 units in the morning and 15 units in the evening.  Please provide 1 month supply 20 mL 0  . INSULIN SYRINGE .5CC/28G (INS SYRINGE/NEEDLE .5CC/28G) 28G X 1/2" 0.5 ML MISC Please provide 1 month supply 100 each 0  . Lancets (FREESTYLE) lancets Use as instructed 100 each 0  . lovastatin (MEVACOR) 20 MG tablet Take 1 tablet (20 mg total) by mouth at bedtime. 30 tablet 0  . metoCLOPramide (REGLAN) 10 MG tablet Take 1 tablet (10 mg total) by mouth 4 (four) times daily -  before meals and at bedtime. 120 tablet 0  . metoprolol tartrate  (LOPRESSOR) 50 MG tablet Take 1 tablet (50 mg total) by mouth 2 (two) times daily. 60 tablet 0  . omeprazole (PRILOSEC) 40 MG capsule Take 1 capsule (40 mg total) by mouth daily. 30 capsule 0  . promethazine (PHENERGAN) 25 MG suppository Place 1 suppository (25 mg total) rectally every 6 (six) hours as needed for refractory nausea / vomiting. 30 each 0  . triamterene-hydrochlorothiazide (DYAZIDE) 50-25 MG capsule Take 1 capsule by mouth every morning. 30 capsule 0   No facility-administered medications prior to visit.      ROS ROS  Objective:  BP (!) 138/91   Pulse 84   Temp 97.7 F (36.5 C) (Oral)   Ht _0  (1.676 m)   Wt 227 lb 3.2 oz (103.1 kg)   LMP 08/09/2016 (Exact Date)   SpO2 100%   BMI 36.67 kg/m   BP/Weight 09/05/2016 08/14/2016 0/80/2233  Systolic BP 612 244 -  Diastolic BP 91 91 -  Wt. (Lbs) 227.2 - 220.02  BMI 36.67 - 34.46      Physical Exam   Assessment & Plan:   1. Type 2 diabetes mellitus with other specified complication, without long-term current use of insulin ([place]) - I called the pharmacy and they will do one time free fill of medications.   2. Hyperglycemia - HgB A1c 8.6% in clinic today. - Glucose (CBG) 272 in clinic today.  3. Diabetic gastroparesis ([place])  4. HTN     Follow-up: 2 weeks  Clent Demark PA

## 2016-09-09 ENCOUNTER — Encounter (HOSPITAL_COMMUNITY): Payer: Self-pay | Admitting: Emergency Medicine

## 2016-09-09 ENCOUNTER — Inpatient Hospital Stay (HOSPITAL_COMMUNITY)
Admission: EM | Admit: 2016-09-09 | Discharge: 2016-09-13 | DRG: 074 | Disposition: A | Discharge status: Home / Self Care | Payer: Self-pay | Attending: Family Medicine | Admitting: Family Medicine

## 2016-09-09 ENCOUNTER — Emergency Department (HOSPITAL_COMMUNITY): Payer: Self-pay

## 2016-09-09 DIAGNOSIS — D509 Iron deficiency anemia, unspecified: Secondary | ICD-10-CM | POA: Diagnosis present

## 2016-09-09 DIAGNOSIS — I160 Hypertensive urgency: Secondary | ICD-10-CM

## 2016-09-09 DIAGNOSIS — F1910 Other psychoactive substance abuse, uncomplicated: Secondary | ICD-10-CM | POA: Diagnosis present

## 2016-09-09 DIAGNOSIS — Z23 Encounter for immunization: Secondary | ICD-10-CM

## 2016-09-09 DIAGNOSIS — I252 Old myocardial infarction: Secondary | ICD-10-CM

## 2016-09-09 DIAGNOSIS — E1142 Type 2 diabetes mellitus with diabetic polyneuropathy: Secondary | ICD-10-CM | POA: Diagnosis present

## 2016-09-09 DIAGNOSIS — F341 Dysthymic disorder: Secondary | ICD-10-CM | POA: Diagnosis present

## 2016-09-09 DIAGNOSIS — E860 Dehydration: Secondary | ICD-10-CM | POA: Diagnosis present

## 2016-09-09 DIAGNOSIS — E785 Hyperlipidemia, unspecified: Secondary | ICD-10-CM | POA: Diagnosis present

## 2016-09-09 DIAGNOSIS — R1110 Vomiting, unspecified: Secondary | ICD-10-CM | POA: Diagnosis present

## 2016-09-09 DIAGNOSIS — E876 Hypokalemia: Secondary | ICD-10-CM | POA: Diagnosis not present

## 2016-09-09 DIAGNOSIS — E1165 Type 2 diabetes mellitus with hyperglycemia: Secondary | ICD-10-CM | POA: Diagnosis present

## 2016-09-09 DIAGNOSIS — G4733 Obstructive sleep apnea (adult) (pediatric): Secondary | ICD-10-CM | POA: Diagnosis present

## 2016-09-09 DIAGNOSIS — I10 Essential (primary) hypertension: Secondary | ICD-10-CM | POA: Diagnosis present

## 2016-09-09 DIAGNOSIS — Z794 Long term (current) use of insulin: Secondary | ICD-10-CM

## 2016-09-09 DIAGNOSIS — Z8673 Personal history of transient ischemic attack (TIA), and cerebral infarction without residual deficits: Secondary | ICD-10-CM

## 2016-09-09 DIAGNOSIS — Z87891 Personal history of nicotine dependence: Secondary | ICD-10-CM

## 2016-09-09 DIAGNOSIS — N920 Excessive and frequent menstruation with regular cycle: Secondary | ICD-10-CM | POA: Diagnosis present

## 2016-09-09 DIAGNOSIS — M797 Fibromyalgia: Secondary | ICD-10-CM | POA: Diagnosis present

## 2016-09-09 DIAGNOSIS — Z9114 Patient's other noncompliance with medication regimen: Secondary | ICD-10-CM

## 2016-09-09 DIAGNOSIS — Z9861 Coronary angioplasty status: Secondary | ICD-10-CM

## 2016-09-09 DIAGNOSIS — Z9119 Patient's noncompliance with other medical treatment and regimen: Secondary | ICD-10-CM

## 2016-09-09 DIAGNOSIS — E119 Type 2 diabetes mellitus without complications: Secondary | ICD-10-CM | POA: Diagnosis present

## 2016-09-09 DIAGNOSIS — D72829 Elevated white blood cell count, unspecified: Secondary | ICD-10-CM | POA: Diagnosis present

## 2016-09-09 DIAGNOSIS — R229 Localized swelling, mass and lump, unspecified: Secondary | ICD-10-CM

## 2016-09-09 DIAGNOSIS — Z79899 Other long term (current) drug therapy: Secondary | ICD-10-CM

## 2016-09-09 DIAGNOSIS — Z6834 Body mass index (BMI) 34.0-34.9, adult: Secondary | ICD-10-CM

## 2016-09-09 DIAGNOSIS — J45909 Unspecified asthma, uncomplicated: Secondary | ICD-10-CM | POA: Diagnosis present

## 2016-09-09 DIAGNOSIS — Z7982 Long term (current) use of aspirin: Secondary | ICD-10-CM

## 2016-09-09 DIAGNOSIS — K219 Gastro-esophageal reflux disease without esophagitis: Secondary | ICD-10-CM | POA: Diagnosis present

## 2016-09-09 DIAGNOSIS — I259 Chronic ischemic heart disease, unspecified: Secondary | ICD-10-CM | POA: Diagnosis present

## 2016-09-09 DIAGNOSIS — N926 Irregular menstruation, unspecified: Secondary | ICD-10-CM | POA: Diagnosis present

## 2016-09-09 DIAGNOSIS — K3184 Gastroparesis: Secondary | ICD-10-CM | POA: Diagnosis present

## 2016-09-09 DIAGNOSIS — E1143 Type 2 diabetes mellitus with diabetic autonomic (poly)neuropathy: Principal | ICD-10-CM | POA: Diagnosis present

## 2016-09-09 DIAGNOSIS — R112 Nausea with vomiting, unspecified: Secondary | ICD-10-CM | POA: Diagnosis present

## 2016-09-09 DIAGNOSIS — D638 Anemia in other chronic diseases classified elsewhere: Secondary | ICD-10-CM | POA: Diagnosis present

## 2016-09-09 DIAGNOSIS — E669 Obesity, unspecified: Secondary | ICD-10-CM | POA: Diagnosis present

## 2016-09-09 DIAGNOSIS — D497 Neoplasm of unspecified behavior of endocrine glands and other parts of nervous system: Secondary | ICD-10-CM

## 2016-09-09 DIAGNOSIS — IMO0002 Reserved for concepts with insufficient information to code with codable children: Secondary | ICD-10-CM

## 2016-09-09 LAB — URINALYSIS, ROUTINE W REFLEX MICROSCOPIC
Bilirubin Urine: NEGATIVE
Glucose, UA: >=500 mg/dL — AB
HGB URINE DIPSTICK: NEGATIVE
Ketones, ur: 20 mg/dL — AB
Leukocytes, UA: NEGATIVE
NITRITE: NEGATIVE
Protein, ur: 30 mg/dL — AB
SPECIFIC GRAVITY, URINE: 1.011 (ref 1.005–1.030)
pH: 8 (ref 5.0–8.0)

## 2016-09-09 LAB — CBC
HEMATOCRIT: 35.1 % — AB (ref 36.0–46.0)
HEMOGLOBIN: 11.6 g/dL — AB (ref 12.0–15.0)
MCH: 22.1 pg — AB (ref 26.0–34.0)
MCHC: 33 g/dL (ref 30.0–36.0)
MCV: 66.7 fL — AB (ref 78.0–100.0)
Platelets: 462 10*3/uL — ABNORMAL HIGH (ref 150–400)
RBC: 5.26 MIL/uL — ABNORMAL HIGH (ref 3.87–5.11)
RDW: 25.1 % — AB (ref 11.5–15.5)
WBC: 15.8 10*3/uL — ABNORMAL HIGH (ref 4.0–10.5)

## 2016-09-09 LAB — COMPREHENSIVE METABOLIC PANEL
ALBUMIN: 3.7 g/dL (ref 3.5–5.0)
ALT: 9 U/L — ABNORMAL LOW (ref 14–54)
ANION GAP: 12 (ref 5–15)
AST: 20 U/L (ref 15–41)
Alkaline Phosphatase: 60 U/L (ref 38–126)
BILIRUBIN TOTAL: 0.5 mg/dL (ref 0.3–1.2)
BUN: 13 mg/dL (ref 6–20)
CHLORIDE: 104 mmol/L (ref 101–111)
CO2: 20 mmol/L — AB (ref 22–32)
Calcium: 9.6 mg/dL (ref 8.9–10.3)
Creatinine, Ser: 0.99 mg/dL (ref 0.44–1.00)
GFR calc Af Amer: >60 mL/min (ref >60)
GFR calc non Af Amer: >60 mL/min (ref >60)
GLUCOSE: 210 mg/dL — AB (ref 65–99)
POTASSIUM: 3.7 mmol/L (ref 3.5–5.1)
SODIUM: 136 mmol/L (ref 135–145)
TOTAL PROTEIN: 8.5 g/dL — AB (ref 6.5–8.1)

## 2016-09-09 LAB — I-STAT CG4 LACTIC ACID, ED
LACTIC ACID, VENOUS: 4.23 mmol/L — AB (ref 0.5–1.9)
LACTIC ACID, VENOUS: 2.61 mmol/L — AB (ref 0.5–1.9)

## 2016-09-09 LAB — RAPID URINE DRUG SCREEN, HOSP PERFORMED
AMPHETAMINES: NOT DETECTED
Barbiturates: NOT DETECTED
Benzodiazepines: NOT DETECTED
COCAINE: NOT DETECTED
OPIATES: NOT DETECTED
Tetrahydrocannabinol: NOT DETECTED

## 2016-09-09 LAB — GLUCOSE, CAPILLARY
GLUCOSE-CAPILLARY: 225 mg/dL — AB (ref 65–99)
GLUCOSE-CAPILLARY: 140 mg/dL — AB (ref 65–99)
Glucose-Capillary: 165 mg/dL — ABNORMAL HIGH (ref 65–99)

## 2016-09-09 LAB — I-STAT BETA HCG BLOOD, ED (MC, WL, AP ONLY)

## 2016-09-09 LAB — LIPASE, BLOOD: Lipase: 30 U/L (ref 11–51)

## 2016-09-09 MED ORDER — ENOXAPARIN SODIUM 40 MG/0.4ML ~~LOC~~ SOLN
40.0000 mg | SUBCUTANEOUS | Status: DC
  Administered 2016-09-09 – 2016-09-13 (×5): 40 mg via SUBCUTANEOUS
  Filled 2016-09-09 (×5): qty 0.4

## 2016-09-09 MED ORDER — METOPROLOL TARTRATE 25 MG PO TABS
50.0000 mg | ORAL_TABLET | Freq: Once | ORAL | Status: AC
  Administered 2016-09-09: 50 mg via ORAL
  Filled 2016-09-09: qty 2

## 2016-09-09 MED ORDER — ONDANSETRON HCL 4 MG/2ML IJ SOLN: 4.0000 mg | Freq: Once | INTRAMUSCULAR | Status: DC

## 2016-09-09 MED ORDER — HYDROCHLOROTHIAZIDE 25 MG PO TABS
25.0000 mg | ORAL_TABLET | Freq: Every day | ORAL | Status: DC
  Administered 2016-09-10 – 2016-09-11 (×2): 25 mg via ORAL
  Filled 2016-09-09 (×4): qty 1

## 2016-09-09 MED ORDER — ONDANSETRON HCL 4 MG/2ML IJ SOLN: 4.0000 mg | Freq: Four times a day (QID) | INTRAMUSCULAR | Status: DC | PRN

## 2016-09-09 MED ORDER — METOCLOPRAMIDE HCL 5 MG/ML IJ SOLN
10.0000 mg | Freq: Once | INTRAMUSCULAR | Status: AC
  Administered 2016-09-09: 10 mg via INTRAVENOUS
  Filled 2016-09-09: qty 2

## 2016-09-09 MED ORDER — LOSARTAN POTASSIUM 50 MG PO TABS
50.0000 mg | ORAL_TABLET | Freq: Every day | ORAL | Status: DC
  Administered 2016-09-10 – 2016-09-11 (×2): 50 mg via ORAL
  Filled 2016-09-09 (×3): qty 1

## 2016-09-09 MED ORDER — FERROUS SULFATE 325 (65 FE) MG PO TABS
325.0000 mg | ORAL_TABLET | Freq: Two times a day (BID) | ORAL | Status: DC
  Administered 2016-09-10: 325 mg via ORAL
  Filled 2016-09-09: qty 1

## 2016-09-09 MED ORDER — HALOPERIDOL LACTATE 5 MG/ML IJ SOLN
5.0000 mg | Freq: Four times a day (QID) | INTRAMUSCULAR | Status: DC | PRN
  Administered 2016-09-09: 5 mg via INTRAMUSCULAR
  Filled 2016-09-09: qty 1

## 2016-09-09 MED ORDER — IOPAMIDOL (ISOVUE-300) INJECTION 61%
INTRAVENOUS | Status: AC
  Filled 2016-09-09: qty 75

## 2016-09-09 MED ORDER — SODIUM CHLORIDE 0.9 % IV SOLN
25.0000 mg | Freq: Once | INTRAVENOUS | Status: AC
  Administered 2016-09-09: 25 mg via INTRAVENOUS
  Filled 2016-09-09: qty 1

## 2016-09-09 MED ORDER — METOCLOPRAMIDE HCL 10 MG PO TABS
10.0000 mg | ORAL_TABLET | Freq: Three times a day (TID) | ORAL | Status: DC
  Administered 2016-09-09 – 2016-09-11 (×7): 10 mg via ORAL
  Filled 2016-09-09 (×9): qty 1

## 2016-09-09 MED ORDER — ACETAMINOPHEN 500 MG PO TABS
500.0000 mg | ORAL_TABLET | Freq: Four times a day (QID) | ORAL | Status: DC | PRN
  Administered 2016-09-09 – 2016-09-12 (×8): 500 mg via ORAL
  Filled 2016-09-09 (×8): qty 1

## 2016-09-09 MED ORDER — PROMETHAZINE HCL 25 MG/ML IJ SOLN: 25.0000 mg | Freq: Four times a day (QID) | INTRAMUSCULAR | Status: DC | PRN

## 2016-09-09 MED ORDER — ASPIRIN EC 81 MG PO TBEC
81.0000 mg | DELAYED_RELEASE_TABLET | Freq: Every day | ORAL | Status: DC
  Administered 2016-09-10 – 2016-09-13 (×4): 81 mg via ORAL
  Filled 2016-09-09 (×4): qty 1

## 2016-09-09 MED ORDER — METOPROLOL TARTRATE 25 MG PO TABS
50.0000 mg | ORAL_TABLET | Freq: Two times a day (BID) | ORAL | Status: DC
  Administered 2016-09-09 – 2016-09-11 (×5): 50 mg via ORAL
  Filled 2016-09-09 (×8): qty 2

## 2016-09-09 MED ORDER — SODIUM CHLORIDE 0.9 % IV BOLUS (SEPSIS)
1000.0000 mL | Freq: Once | INTRAVENOUS | Status: AC
  Administered 2016-09-09: 1000 mL via INTRAVENOUS

## 2016-09-09 MED ORDER — INSULIN ASPART 100 UNIT/ML ~~LOC~~ SOLN
0.0000 [IU] | Freq: Three times a day (TID) | SUBCUTANEOUS | Status: DC
  Administered 2016-09-09: 5 [IU] via SUBCUTANEOUS
  Administered 2016-09-09: 3 [IU] via SUBCUTANEOUS
  Administered 2016-09-10: 2 [IU] via SUBCUTANEOUS
  Administered 2016-09-10 – 2016-09-11 (×3): 3 [IU] via SUBCUTANEOUS
  Administered 2016-09-11 – 2016-09-12 (×2): 2 [IU] via SUBCUTANEOUS
  Administered 2016-09-12: 5 [IU] via SUBCUTANEOUS
  Administered 2016-09-12: 3 [IU] via SUBCUTANEOUS
  Administered 2016-09-13: 15 [IU] via SUBCUTANEOUS
  Administered 2016-09-13: 8 [IU] via SUBCUTANEOUS

## 2016-09-09 MED ORDER — SODIUM CHLORIDE 0.9 % IV SOLN
25.0000 mg | INTRAVENOUS | Status: DC
  Filled 2016-09-09: qty 1

## 2016-09-09 MED ORDER — SODIUM CHLORIDE 0.9 % IV SOLN
INTRAVENOUS | Status: DC
  Administered 2016-09-09 – 2016-09-13 (×8): via INTRAVENOUS

## 2016-09-09 MED ORDER — HYDRALAZINE HCL 20 MG/ML IJ SOLN
5.0000 mg | Freq: Three times a day (TID) | INTRAMUSCULAR | Status: DC | PRN
  Administered 2016-09-09: 10 mg via INTRAVENOUS
  Filled 2016-09-09: qty 1

## 2016-09-09 MED ORDER — ONDANSETRON HCL 4 MG PO TABS
4.0000 mg | ORAL_TABLET | Freq: Four times a day (QID) | ORAL | Status: DC | PRN
  Administered 2016-09-10: 4 mg via ORAL
  Filled 2016-09-09: qty 1

## 2016-09-09 MED ORDER — PROCHLORPERAZINE EDISYLATE 5 MG/ML IJ SOLN
10.0000 mg | Freq: Once | INTRAMUSCULAR | Status: AC
  Administered 2016-09-09: 10 mg via INTRAVENOUS
  Filled 2016-09-09: qty 2

## 2016-09-09 MED ORDER — DIPHENHYDRAMINE HCL 50 MG/ML IJ SOLN
25.0000 mg | Freq: Once | INTRAMUSCULAR | Status: AC
  Administered 2016-09-09: 25 mg via INTRAVENOUS
  Filled 2016-09-09: qty 1

## 2016-09-09 MED ORDER — LORAZEPAM 2 MG/ML IJ SOLN
1.0000 mg | Freq: Once | INTRAMUSCULAR | Status: AC
  Administered 2016-09-09: 1 mg via INTRAVENOUS
  Filled 2016-09-09: qty 1

## 2016-09-09 MED ORDER — PROMETHAZINE HCL 25 MG/ML IJ SOLN
25.0000 mg | Freq: Once | INTRAMUSCULAR | Status: AC
  Administered 2016-09-09: 25 mg via INTRAVENOUS
  Filled 2016-09-09: qty 1

## 2016-09-09 MED ORDER — PANTOPRAZOLE SODIUM 40 MG PO TBEC
40.0000 mg | DELAYED_RELEASE_TABLET | Freq: Every day | ORAL | Status: DC
  Administered 2016-09-10: 40 mg via ORAL
  Filled 2016-09-09 (×3): qty 1

## 2016-09-09 MED ORDER — BISACODYL 10 MG RE SUPP: 10.0000 mg | Freq: Every day | RECTAL | Status: DC | PRN

## 2016-09-09 MED ORDER — ALBUTEROL SULFATE (2.5 MG/3ML) 0.083% IN NEBU: 2.5000 mg | INHALATION_SOLUTION | RESPIRATORY_TRACT | Status: DC | PRN

## 2016-09-09 MED ORDER — PROMETHAZINE HCL 25 MG RE SUPP
25.0000 mg | Freq: Four times a day (QID) | RECTAL | Status: DC | PRN
  Filled 2016-09-09: qty 1

## 2016-09-09 MED ORDER — PRAVASTATIN SODIUM 10 MG PO TABS
10.0000 mg | ORAL_TABLET | Freq: Every day | ORAL | Status: DC
  Administered 2016-09-09 – 2016-09-12 (×3): 10 mg via ORAL
  Filled 2016-09-09 (×6): qty 1

## 2016-09-09 MED ORDER — SENNOSIDES-DOCUSATE SODIUM 8.6-50 MG PO TABS: 1.0000 | ORAL_TABLET | Freq: Every evening | ORAL | Status: DC | PRN

## 2016-09-09 NOTE — ED Provider Notes (Signed)
[facility] DEPT Provider Note   CSN: [id number] Arrival date & time: 09/09/16  0044  By signing my name below, I, Mayer Masker, attest that this documentation has been prepared under the direction and in the presence of Marin Wisner, Annie Main, MD. Electronically Signed: Mayer Masker, Scribe. 09/09/16. 12:58 AM.  History   Chief Complaint Chief Complaint  Patient presents with  . Abdominal Pain  . Emesis  The history is provided by the patient. No language interpreter was used.   HPI Comments: Shirley [name] is a 41 y.o. female with a PMHx of DM, asthma, and gastroparesis who presents to the Emergency Department complaining of acute, constant vomiting since yesterday. She has associated constant abdominal pain on the lower left side and blood in her emesis. She states she has had these symptoms before and she was admitted to the hospital previously on 08-14-16 for her gastroparesis. She denies CP, SOB, cough, fever, dysuria, hematuria, and diarrhea. Pt is not on blood thinners or coumadin. Pt states she missed some days of her medications due to not having insurance.  Past Medical History:  Diagnosis Date  . Abscess of tunica vaginalis    10/09- Abundant S. aureus- sensitive to all abx  . Anxiety   . Blood dyscrasia   . CAD (coronary artery disease) 06/15/2006   s/p Subendocardial MI with PDA angioplasty(no stent) on 06/15/06 and relook  cath 06/19/06 showed patency of site. Cath 12/10- no restenosis or significant CAD progression  . CVA (cerebral vascular accident) ([place]) ~ 02/2014   denies residual on 04/22/2014  . CVA (cerebral vascular accident) [facility])    history of remote right cerebellar infarct noted on head CT at least since 10/2011  . Depression   . Diabetes mellitus type 2, uncontrolled, with complications ([place])   . Fibromyalgia   . Gastritis   . Gastroparesis    secondary to poorly controlled DM, last emptying study performed 01/2010  was normal but may be falsely positive as  pt was on reglan  . GERD (gastroesophageal reflux disease)   . Hepatitis B, chronic (HCC)    Hep BeAb+,Hep B cAb+ & Hep BsAg+ (9/06)  . History of pyelonephritis    H/o GrpB Pyelonephritis (9/06) and UTI- 07/11- E.Coli, 12/10- GBS  . Hyperlipidemia   . Hypertension   . Iron deficiency anemia   . Irregular menses    Small ovarian follicles seen on PP(2/95)  . MI (myocardial infarction) ([place]) 05/2006   PDA percutaneous transluminal coronary angioplasty  . Migraine    "weekly" (04/22/2014)  . N&V (nausea and vomiting)    Chronic. Unclear etiology with multiple admission and ED visits. CT abdomen with and without contrast (02/2011)  showed no acute process. Gastic Emptying scan (01/2010) was normal. Ultrasound of the abdomen was within normal limits. Hepatitis B viral load was undectable. HIV NR. EGD - gastritis, Hpylori + s/p Rx  . Obesity   . OSA (obstructive sleep apnea)    "suppose to wear mask but I don't" (04/22/2014)  . Peripheral neuropathy   . Pneumonia    "this is probably the 2nd or 3rd time I've had pneumonia" (04/22/2014)  . Recurrent boils   . Seasonal asthma   . Substance abuse   . Thrombocytosis ([place])    Hem/Onc suggested 2/2 chronic hepatits and/or iron deficiency anemia    Patient Active Problem List   Diagnosis Date Noted  . Intractable nausea and vomiting 08/11/2016  . Type 2 diabetes mellitus with hyperglycemia ([place]) 04/07/2016  .  Nausea & vomiting 04/07/2016  . Diabetic gastroparesis ([place]) 04/06/2016  . Non-intractable cyclical vomiting with nausea   . Dehydration 03/27/2015  . Intractable vomiting with nausea 11/11/2014  . Gastroparesis 11/11/2014  . Cough   . Hematemesis 10/13/2014  . DKA (diabetic ketoacidoses) ([place]) 10/13/2014  . Increased anion gap metabolic acidosis 60/73/7106  . Tachycardia 07/21/2014  . Abdominal pain, chronic, left lower quadrant   . Nausea with vomiting   . Diabetic gastroparesis associated with type 2 diabetes mellitus ([place])  04/28/2014  . History of Helicobacter pylori infection 04/22/2014  . Vaginal discharge 02/18/2014  . Atypical chest pain 07/22/2013  . Unspecified constipation 07/21/2013  . Tinea corporis 07/21/2013  . Intractable vomiting 04/29/2013  . Dysmenorrhea 04/22/2013  . Headache(784.0) 02/08/2012  . Health care maintenance 01/22/2012  . Chronic hepatitis B ([place]) 03/07/2011  . History of leukocytosis 04/06/2010  . THROMBOCYTOSIS 04/06/2010  . Polysubstance abuse 02/23/2010  . Iron deficiency anemia 11/22/2009  . PERIPHERAL NEUROPATHY 10/01/2009  . Hyperlipidemia 08/30/2009  . POLYNEUROPATHY, DIABETIC 08/30/2009  . Hidradenitis (recurrent boils) 07/07/2008  . Depression 12/27/2007  . Abdominal pain, left lower quadrant 11/21/2007  . FIBROMYALGIA 10/30/2007  . BACK PAIN 04/01/2007  . OBSTRUCTIVE SLEEP APNEA 01/17/2007  . ANXIETY DEPRESSION 06/27/2006  . Chronic ischemic heart disease 06/15/2006  . OBESITY, MORBID 05/15/2006  . MIGRAINE HEADACHE 05/15/2006  . Asthma 05/15/2006  . Essential hypertension 01/16/2006  . IRREGULAR MENSTRUATION 01/16/2006  . PEDAL EDEMA 01/16/2006  . Poorly controlled type 2 diabetes mellitus with peripheral neuropathy ([place]) 01/16/1989    Past Surgical History:  Procedure Laterality Date  . CESAREAN SECTION  1997  . CORONARY ANGIOPLASTY WITH STENT PLACEMENT  2008   "2 stents"  . ESOPHAGOGASTRODUODENOSCOPY N/A 04/23/2014   Procedure: ESOPHAGOGASTRODUODENOSCOPY (EGD);  Surgeon: Winfield Cunas., MD;  Location[facility] ENDOSCOPY;  Service: Endoscopy;  Laterality: N/A;    OB History    No data available       Home Medications    Prior to Admission medications   Medication Sig Start Date End Date Taking? Authorizing Provider  acetaminophen (TYLENOL) 500 MG tablet Take 500 mg by mouth every 6 (six) hours as needed.    [provider]  albuterol (PROVENTIL HFA;VENTOLIN HFA) 108 (90 Base) MCG/ACT inhaler Inhale 2 puffs into the lungs every 4  (four) hours as needed for wheezing or shortness of breath. 09/05/16   Clent Demark, PA-C  aspirin EC 81 MG tablet Take 1 tablet (81 mg total) by mouth daily. 09/05/16   Clent Demark, PA-C  ferrous sulfate 325 (65 FE) MG tablet Take 1 tablet (325 mg total) by mouth 2 (two) times daily with a meal. 09/05/16   Clent Demark, PA-C  glucose blood (FREESTYLE TEST STRIPS) test strip Use as instructed 09/05/16   Clent Demark, PA-C  glucose monitoring kit (FREESTYLE) monitoring kit 1 each by Does not apply route 4 (four) times daily - after meals and at bedtime. 1 month Diabetic Testing Supplies for QAC-QHS accuchecks. 09/05/16   Clent Demark, PA-C  hydrochlorothiazide (HYDRODIURIL) 25 MG tablet Take 1 tablet (25 mg total) by mouth daily. Take on tablet in the morning. 09/05/16   Clent Demark, PA-C  insulin NPH-regular Human (NOVOLIN 70/30) (70-30) 100 UNIT/ML injection 15 units in the morning and 15 units in the evening. 09/05/16   Clent Demark, PA-C  INSULIN SYRINGE .5CC/28G (INS SYRINGE/NEEDLE .5CC/28G) 28G X 1/2" 0.5 ML MISC Please provide 1 month supply  09/05/16   Clent Demark, PA-C  Lancets (FREESTYLE) lancets Use as instructed 09/05/16   Clent Demark, PA-C  losartan (COZAAR) 50 MG tablet Take 1 tablet (50 mg total) by mouth daily. 09/05/16   Clent Demark, PA-C  lovastatin (MEVACOR) 20 MG tablet Take 1 tablet (20 mg total) by mouth at bedtime. 09/05/16   Clent Demark, PA-C  metoCLOPramide (REGLAN) 10 MG tablet Take 1 tablet (10 mg total) by mouth 4 (four) times daily -  before meals and at bedtime. 09/05/16   Clent Demark, PA-C  metoprolol tartrate (LOPRESSOR) 50 MG tablet Take 1 tablet (50 mg total) by mouth 2 (two) times daily. 09/05/16   Clent Demark, PA-C  omeprazole (PRILOSEC) 40 MG capsule Take 1 capsule (40 mg total) by mouth daily. 09/05/16   Clent Demark, PA-C  promethazine (PHENERGAN) 25 MG suppository Place 1 suppository (25  mg total) rectally every 6 (six) hours as needed for refractory nausea / vomiting. 09/05/16   Clent Demark, PA-C    Family History Family History  Problem Relation Age of Onset  . Diabetes Father     Social History Social History  Substance Use Topics  . Smoking status: Former Smoker    Types: Cigarettes    Quit date: 04/24/1996  . Smokeless tobacco: Never Used     Comment: quit smoking cigarettes age 27  . Alcohol use 0.0 oz/week     Comment: 04/22/2014 "might have a few drinks a month"     Allergies   Lisinopril and Morphine and related   Review of Systems Review of Systems  Constitutional: Negative for fever.  Respiratory: Negative for cough and shortness of breath.   Cardiovascular: Negative for chest pain.  Gastrointestinal: Positive for abdominal pain, nausea and vomiting (blood in emesis). Negative for diarrhea.  Genitourinary: Negative for dysuria and hematuria.   A complete 10 system review of systems was obtained and all systems are negative except as noted in the HPI and PMH.    Physical Exam Updated Vital Signs BP (!) 152/76 (BP Location: Right Arm)   Pulse (!) 133   Temp 98.8 F (37.1 C) (Oral)   Resp 18   Ht 5' 7"  (1.702 m)   Wt 219 lb (99.3 kg)   SpO2 95%   BMI 34.30 kg/m   Physical Exam  Constitutional: She is oriented to person, place, and time. She appears well-developed and well-nourished. She appears distressed.  Patient is writhing in pain    HENT:  Head: Normocephalic and atraumatic.  Mouth/Throat: Oropharynx is clear and moist. No oropharyngeal exudate.  Eyes: Conjunctivae and EOM are normal. Pupils are equal, round, and reactive to light.  Neck: Normal range of motion. Neck supple.  No meningismus.  Cardiovascular: Regular rhythm, normal heart sounds and intact distal pulses.   No murmur heard. Pulmonary/Chest: Effort normal and breath sounds normal. No respiratory distress.  Abdominal: There is tenderness.  Abdominal LLQ  tenderness Acute vomiting Obese  Musculoskeletal: Normal range of motion. She exhibits no edema or tenderness.  Neurological: She is alert and oriented to person, place, and time. No cranial nerve deficit. She exhibits normal muscle tone. Coordination normal.   5/5 strength throughout. CN 2-12 intact.Equal grip strength.   Skin: Skin is warm.  Psychiatric: She has a normal mood and affect. Her behavior is normal.  Nursing note and vitals reviewed.    ED Treatments / Results  DIAGNOSTIC STUDIES: Oxygen Saturation is 95% on RA, normal  by my interpretation.    COORDINATION OF CARE: 1:54 AM Discussed treatment plan with pt at bedside and pt agreed to plan. Labs (all labs ordered are listed, but only abnormal results are displayed) Labs Reviewed  COMPREHENSIVE METABOLIC PANEL - Abnormal; Notable for the following:       Result Value   CO2 20 (*)    Glucose, Bld 210 (*)    Total Protein 8.5 (*)    ALT 9 (*)    All other components within normal limits  CBC - Abnormal; Notable for the following:    WBC 15.8 (*)    RBC 5.26 (*)    Hemoglobin 11.6 (*)    HCT 35.1 (*)    MCV 66.7 (*)    MCH 22.1 (*)    RDW 25.1 (*)    Platelets 462 (*)    All other components within normal limits  URINALYSIS, ROUTINE W REFLEX MICROSCOPIC - Abnormal; Notable for the following:    Color, Urine STRAW (*)    Glucose, UA >=500 (*)    Ketones, ur 20 (*)    Protein, ur 30 (*)    Bacteria, UA RARE (*)    Squamous Epithelial / LPF 0-5 (*)    All other components within normal limits  I-STAT CG4 LACTIC ACID, ED - Abnormal; Notable for the following:    Lactic Acid, Venous 4.23 (*)    All other components within normal limits  I-STAT CG4 LACTIC ACID, ED - Abnormal; Notable for the following:    Lactic Acid, Venous 2.61 (*)    All other components within normal limits  LIPASE, BLOOD  RAPID URINE DRUG SCREEN, HOSP PERFORMED  I-STAT BETA HCG BLOOD, ED ([facility], [facility], [facility] ONLY)    EKG  EKG  Interpretation  Date/Time:  Saturday Sep 09 2016 02:08:22 EDT Ventricular Rate:  121 PR Interval:    QRS Duration: 76 QT Interval:  332 QTC Calculation: 471 R Axis:   69 Text Interpretation:  Sinus tachycardia Multiple ventricular premature complexes Aberrant complex Probable left atrial enlargement No significant change was found Confirmed by Ezequiel Essex [phone]) on 09/09/2016 3:03:44 AM Also confirmed by Ezequiel Essex [phone]), editor Drema Pry [phone])  on 09/09/2016 9:09:29 AM       Radiology Dg Abdomen Acute W/chest  Result Date: 09/09/2016 CLINICAL DATA:  Abdominal pain and vomiting EXAM: DG ABDOMEN ACUTE W/ 1V CHEST COMPARISON:  Abdominal radiograph 08/11/2016 FINDINGS: There are no dilated bowel loops or free intraperitoneal air. No radiopaque calculi or other significant radiographic abnormality is seen. Heart size and mediastinal contours are within normal limits. Both lungs are clear. IMPRESSION: Negative abdominal radiographs.  No acute cardiopulmonary disease. Electronically Signed   By: Ulyses Jarred M.D.   On: 09/09/2016 04:00    Procedures Procedures (including critical care time)  Medications Ordered in ED Medications - No data to display   Initial Impression / Assessment and Plan / ED Course  I have reviewed the triage vital signs and the nursing notes.  Pertinent labs & imaging results that were available during my care of the patient were reviewed by me and considered in my medical decision making (see chart for details).    Patient presents with nausea vomiting abdominal pain over the past day. History of gastroparesis and recurrent nausea and vomiting. She is writhing around the bed and vomiting on arrival.  @0310  pt appears in better condition after receiving pain medication, pt is uncooperative due to sedation  Lactate elevation likely due  to dehydration and vomiting. Doubt sepsis.   Additional IVF and antiemetics given.   Remains unable  to tolerate PO.  Hyperglycemia without DKA.  AAS negative. Abdomen soft.   Remains tachycardic and hypertensive. Unable to tolerate PO meds. Admission d/w PAC [place].  Final Clinical Impressions(s) / ED Diagnoses   Final diagnoses:  Intractable vomiting with nausea, unspecified vomiting type  Hypertensive urgency    New Prescriptions New Prescriptions   No medications on file  I personally performed the services described in this documentation, which was scribed in my presence. The recorded information has been reviewed and is accurate.     Ezequiel Essex, MD 09/09/16 [phone]

## 2016-09-09 NOTE — ED Notes (Signed)
Attempted IV x 2.  Both IV attempts infiltrated. Both in right forearm. Pt rolling around in bed moaning. Inconsolable.

## 2016-09-09 NOTE — ED Notes (Signed)
Pt given water for PO challenge and started vomiting approx 15 minutes after.

## 2016-09-09 NOTE — Progress Notes (Signed)
MEDICATION RELATED CONSULT NOTE - INITIAL   Pharmacy Consult for Promethazine continuous infusion Indication: intractable nausea and vomiting  Allergies  Allergen Reactions  . Lisinopril Nausea Only and Swelling  . Morphine And Related Itching and Swelling    Patient Measurements: Height: 5\' 7"  (170.2 cm) Weight: 219 lb (99.3 kg) IBW/kg (Calculated) : 61.6  Vital Signs: Temp: 98.8 F (37.1 C) (05/19 0047) Temp Source: Oral (05/19 0047) BP: 177/83 (05/19 0746) Pulse Rate: 106 (05/19 0746) Intake/Output from previous day: No intake/output data recorded. Intake/Output from this shift: Total I/O In: 1998 [IV Piggyback:1998] Out: -   Labs:  Recent Labs  09/09/16 0056  WBC 15.8*  HGB 11.6*  HCT 35.1*  PLT 462*  CREATININE 0.99  ALBUMIN 3.7  PROT 8.5*  AST 20  ALT 9*  ALKPHOS 60  BILITOT 0.5   Estimated Creatinine Clearance: 89.6 mL/min (by C-G formula based on SCr of 0.99 mg/dL).   Microbiology: Recent Results (from the past 720 hour(s))  Culture, Urine     Status: Abnormal   Collection Time: 08/10/16  6:57 PM  Result Value Ref Range Status   Specimen Description URINE, RANDOM  Final   Special Requests NONE  Final   Culture MULTIPLE SPECIES PRESENT, SUGGEST RECOLLECTION (A)  Final   Report Status 08/12/2016 FINAL  Final  MRSA PCR Screening     Status: None   Collection Time: 08/11/16  3:10 AM  Result Value Ref Range Status   MRSA by PCR NEGATIVE NEGATIVE Final    Comment:        The GeneXpert MRSA Assay (FDA approved for NASAL specimens only), is one component of a comprehensive MRSA colonization surveillance program. It is not intended to diagnose MRSA infection nor to guide or monitor treatment for MRSA infections.     Medical History: Past Medical History:  Diagnosis Date  . Abscess of tunica vaginalis    10/09- Abundant S. aureus- sensitive to all abx  . Anxiety   . Blood dyscrasia   . CAD (coronary artery disease) 06/15/2006   s/p  Subendocardial MI with PDA angioplasty(no stent) on 06/15/06 and relook  cath 06/19/06 showed patency of site. Cath 12/10- no restenosis or significant CAD progression  . CVA (cerebral vascular accident) ([place]) ~ 02/2014   denies residual on 04/22/2014  . CVA (cerebral vascular accident) [facility])    history of remote right cerebellar infarct noted on head CT at least since 10/2011  . Depression   . Diabetes mellitus type 2, uncontrolled, with complications ([place])   . Fibromyalgia   . Gastritis   . Gastroparesis    secondary to poorly controlled DM, last emptying study performed 01/2010  was normal but may be falsely positive as pt was on reglan  . GERD (gastroesophageal reflux disease)   . Hepatitis B, chronic (HCC)    Hep BeAb+,Hep B cAb+ & Hep BsAg+ (9/06)  . History of pyelonephritis    H/o GrpB Pyelonephritis (9/06) and UTI- 07/11- E.Coli, 12/10- GBS  . Hyperlipidemia   . Hypertension   . Iron deficiency anemia   . Irregular menses    Small ovarian follicles seen on ON(6/29)  . MI (myocardial infarction) ([place]) 05/2006   PDA percutaneous transluminal coronary angioplasty  . Migraine    "weekly" (04/22/2014)  . N&V (nausea and vomiting)    Chronic. Unclear etiology with multiple admission and ED visits. CT abdomen with and without contrast (02/2011)  showed no acute process. Gastic Emptying scan (01/2010) was normal. Ultrasound  of the abdomen was within normal limits. Hepatitis B viral load was undectable. HIV NR. EGD - gastritis, Hpylori + s/p Rx  . Obesity   . OSA (obstructive sleep apnea)    "suppose to wear mask but I don't" (04/22/2014)  . Peripheral neuropathy   . Pneumonia    "this is probably the 2nd or 3rd time I've had pneumonia" (04/22/2014)  . Recurrent boils   . Seasonal asthma   . Substance abuse   . Thrombocytosis (HCC)    Hem/Onc suggested 2/2 chronic hepatits and/or iron deficiency anemia    Antiemetic Medications Received:  Lorazepam 1mg  IV Metoclopramide 10mg   IV Promethazine 25mg  IV Prochlorperazine 10mg  IV Diphenhydramine 25mg  IV   Assessment: Case discussed with Sharene Butters, PA. Patient with intractable nausea and vomiting. Provider requested continuous infusion promethazine. On a literature search I found minimal information on promethazine continuous infusion.  There is an order in our [facility] medication orders for promethazine continuous infusion. I discussed rates and doses with Clarise Cruz and we agreed on a dose of ~25mg  delivered over 4 hours.  This is mixed in a significant amount of fluid as well, so only 1 bag will be ordered with the provider to be contacted to determine the need for additional orders.  Zofran was considered as a treatment option. Her QTc this morning is 473 msec which is considered prolonged.   Goal of Therapy:  Improvement in nausea/vomiting  Plan:  Start promethazine continuous infusion of 6.25 mg/hr.  This will provide 25 mg in 4 hours.  Note 25mg  are mixed in 1L of IV fluids so she will receive 1L of fluids in this time as well.  Provider to be contacted if additional orders are needed.  Norva Riffle 09/09/2016,8:20 AM

## 2016-09-09 NOTE — Progress Notes (Signed)
Patient refused CPAP use for the evening. RT will continue to monitor as needed.

## 2016-09-09 NOTE — ED Triage Notes (Signed)
Pt reports that she has been "throwing up all day" and is having "horrible abdominal pain".  Pt reports LLQ pain states it feels like it is squeezing and stabbing and in unable to remain still.

## 2016-09-09 NOTE — Progress Notes (Signed)
My attestation seem to suggest the patient would be able to discharge in 24 hours. Actually I believe that she will need 24 hours on a Phenergan drip and then she will need to be slowly weaned off it and the able to tolerate oral medications for at least another midnight prior to discharge home.

## 2016-09-09 NOTE — H&P (Signed)
History and Physical    Shirley [name]:[id number] DOB: [birth year] DOA: 09/09/2016   PCP: Clent Demark, PA-C   Patient coming from:  Home    Chief Complaint: Gastroparesis with nausea and vomiting   HPI: Shirley [name] is a 41 y.o. female with medical history significant for diabetes, iron deficiency anemia, hypertension, hyperlipidemia, GERD, prior history of CVA, history of gastroparesis with recent admission for nausea and vomiting from 4/22 08/14/2016, anxiety, history of medical noncompliance, presenting with intractable nausea and vomiting, as well as abdominal pain. Symptoms began overnight. At the time of evaluation, the patient appears to be symptomatic, unable to provide detailed information. For ER physician's note, the patient has not been able to keep down any oral fluids or liquids. No diarrhea or hematochezia. No apparent headaches or vision changes. No dysphagia. No confusion or seizures. No part and shortness of breath, or chest pain or palpitations. No lower extremity swelling. Of note, the patient had on admission elevated blood pressure, however she has not been compliant with her meds, currently is being monitored. The patient was the key cardiac, but these improved with IV fluid administration.    ED Course:  BP (!) 203/112   Pulse 97   Temp 99.1 F (37.3 C) (Oral)   Resp 18   Ht 5' 7"  (1.702 m)   Wt 99.3 kg (219 lb)   SpO2 99%   BMI 34.30 kg/m    white count 15.8 hemoglobin 11.6 platelets 462 sodium 136 potassium 3.7  creatinine 0.99 glucose 210 initial lactic acid was 4.23, now 2.61 lipase pending urinalysis pending EKG with sinus tachycardia without ACS.  hemoglobin A1 C was 8.6 abdominal x-ray negative for acute intra-abdominal abnormalities. Lower chest without acute cardiopulmonary disease  last gastric emptying scan was done in 2011, which was normal, with 19% retention patient received IV Reglan, IV compazine , IV Benadryl, ITV Haldol, and  ID Phenergan, without improvement of her symptoms.  Review of Systems:  As per HPI otherwise all other systems reviewed and are negative  Past Medical History:  Diagnosis Date  . Abscess of tunica vaginalis    10/09- Abundant S. aureus- sensitive to all abx  . Anxiety   . Blood dyscrasia   . CAD (coronary artery disease) 06/15/2006   s/p Subendocardial MI with PDA angioplasty(no stent) on 06/15/06 and relook  cath 06/19/06 showed patency of site. Cath 12/10- no restenosis or significant CAD progression  . CVA (cerebral vascular accident) ([place]) ~ 02/2014   denies residual on 04/22/2014  . CVA (cerebral vascular accident) [facility])    history of remote right cerebellar infarct noted on head CT at least since 10/2011  . Depression   . Diabetes mellitus type 2, uncontrolled, with complications ([place])   . Fibromyalgia   . Gastritis   . Gastroparesis    secondary to poorly controlled DM, last emptying study performed 01/2010  was normal but may be falsely positive as pt was on reglan  . GERD (gastroesophageal reflux disease)   . Hepatitis B, chronic (HCC)    Hep BeAb+,Hep B cAb+ & Hep BsAg+ (9/06)  . History of pyelonephritis    H/o GrpB Pyelonephritis (9/06) and UTI- 07/11- E.Coli, 12/10- GBS  . Hyperlipidemia   . Hypertension   . Iron deficiency anemia   . Irregular menses    Small ovarian follicles seen on NF(6/21)  . MI (myocardial infarction) ([place]) 05/2006   PDA percutaneous transluminal coronary angioplasty  . Migraine    "  weekly" (04/22/2014)  . N&V (nausea and vomiting)    Chronic. Unclear etiology with multiple admission and ED visits. CT abdomen with and without contrast (02/2011)  showed no acute process. Gastic Emptying scan (01/2010) was normal. Ultrasound of the abdomen was within normal limits. Hepatitis B viral load was undectable. HIV NR. EGD - gastritis, Hpylori + s/p Rx  . Obesity   . OSA (obstructive sleep apnea)    "suppose to wear mask but I don't" (04/22/2014)  .  Peripheral neuropathy   . Pneumonia    "this is probably the 2nd or 3rd time I've had pneumonia" (04/22/2014)  . Recurrent boils   . Seasonal asthma   . Substance abuse   . Thrombocytosis ([place])    Hem/Onc suggested 2/2 chronic hepatits and/or iron deficiency anemia    Past Surgical History:  Procedure Laterality Date  . CESAREAN SECTION  1997  . CORONARY ANGIOPLASTY WITH STENT PLACEMENT  2008   "2 stents"  . ESOPHAGOGASTRODUODENOSCOPY N/A 04/23/2014   Procedure: ESOPHAGOGASTRODUODENOSCOPY (EGD);  Surgeon: Winfield Cunas., MD;  Location: [facility] ENDOSCOPY;  Service: Endoscopy;  Laterality: N/A;    Social History Social History   Social History  . Marital status: Single    Spouse name: N/A  . Number of children: 2  . Years of education: 11   Occupational History  . other     unemployed   Social History Main Topics  . Smoking status: Former Smoker    Types: Cigarettes    Quit date: 04/24/1996  . Smokeless tobacco: Never Used     Comment: quit smoking cigarettes age 21  . Alcohol use 0.0 oz/week     Comment: 04/22/2014 "might have a few drinks a month"  . Drug use: No     Comment: 04/22/2104 "quit drugs ~ 1-2 yr ago"  . Sexual activity: Yes    Birth control/ protection: None   Other Topics Concern  . 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   Also works at [employer] having to lift elderly individuals.           Allergies  Allergen Reactions  . Lisinopril Nausea Only and Swelling  . Morphine And Related Itching and Swelling    Family History  Problem Relation Age of Onset  . Diabetes Father       Prior to Admission medications   Medication Sig Start Date End Date Taking? Authorizing Provider  acetaminophen (TYLENOL) 500 MG tablet Take 500 mg by mouth every 6 (six) hours as needed.    [provider]  albuterol (PROVENTIL HFA;VENTOLIN HFA) 108 (90 Base)  MCG/ACT inhaler Inhale 2 puffs into the lungs every 4 (four) hours as needed for wheezing or shortness of breath. 09/05/16   Clent Demark, PA-C  aspirin EC 81 MG tablet Take 1 tablet (81 mg total) by mouth daily. 09/05/16   Clent Demark, PA-C  ferrous sulfate 325 (65 FE) MG tablet Take 1 tablet (325 mg total) by mouth 2 (two) times daily with a meal. 09/05/16   Clent Demark, PA-C  glucose blood (FREESTYLE TEST STRIPS) test strip Use as instructed 09/05/16   Clent Demark, PA-C  glucose monitoring kit (FREESTYLE) monitoring kit 1 each by Does not apply route 4 (four) times daily - after meals and at bedtime. 1 month Diabetic Testing Supplies for QAC-QHS accuchecks. 09/05/16  Clent Demark, PA-C  hydrochlorothiazide (HYDRODIURIL) 25 MG tablet Take 1 tablet (25 mg total) by mouth daily. Take on tablet in the morning. 09/05/16   Clent Demark, PA-C  insulin NPH-regular Human (NOVOLIN 70/30) (70-30) 100 UNIT/ML injection 15 units in the morning and 15 units in the evening. 09/05/16   Clent Demark, PA-C  INSULIN SYRINGE .5CC/28G (INS SYRINGE/NEEDLE .5CC/28G) 28G X 1/2" 0.5 ML MISC Please provide 1 month supply 09/05/16   Clent Demark, PA-C  Lancets (FREESTYLE) lancets Use as instructed 09/05/16   Clent Demark, PA-C  losartan (COZAAR) 50 MG tablet Take 1 tablet (50 mg total) by mouth daily. 09/05/16   Clent Demark, PA-C  lovastatin (MEVACOR) 20 MG tablet Take 1 tablet (20 mg total) by mouth at bedtime. 09/05/16   Clent Demark, PA-C  metoCLOPramide (REGLAN) 10 MG tablet Take 1 tablet (10 mg total) by mouth 4 (four) times daily -  before meals and at bedtime. 09/05/16   Clent Demark, PA-C  metoprolol tartrate (LOPRESSOR) 50 MG tablet Take 1 tablet (50 mg total) by mouth 2 (two) times daily. 09/05/16   Clent Demark, PA-C  omeprazole (PRILOSEC) 40 MG capsule Take 1 capsule (40 mg total) by mouth daily. 09/05/16   Clent Demark, PA-C  promethazine  (PHENERGAN) 25 MG suppository Place 1 suppository (25 mg total) rectally every 6 (six) hours as needed for refractory nausea / vomiting. 09/05/16   Clent Demark, PA-C    Physical Exam:  Vitals:   09/09/16 0736 09/09/16 0745 09/09/16 0746 09/09/16 0841  BP: (!) 184/102  (!) 177/83 (!) 203/112  Pulse: (!) 101 96 (!) 106 97  Resp:   18 18  Temp:    99.1 F (37.3 C)  TempSrc:    Oral  SpO2: 100% 99% 99% 99%  Weight:      Height:       Constitutional: Ill appearing  Eyes: PERRL, lids and conjunctivae normal ENMT: Mucous membranes are moist, without exudate or lesions  Neck: normal, supple, no masses, no thyromegaly Respiratory: clear to auscultation bilaterally, no wheezing, no crackles. Normal respiratory effort  Cardiovascular: Regular rate and rhythm, no murmurs, rubs or gallops. No extremity edema. 2+ pedal pulses. No carotid bruits.  Abdomen: Soft, mild tenderness on deep palpation worse in the LLQ, No hepatosplenomegaly. Bowel sounds positive.  Musculoskeletal: no clubbing / cyanosis. Moves all extremities Skin: no jaundice, No lesions.  Neurologic: Sensation intact  Strength equal in all extremities.  Psychiatric:   Alert and oriented x 3. Patient is very anxious, symptoms seem to be worse when healthcare provider enters the room   Labs on Admission: I have personally reviewed following labs and imaging studies  CBC:  Recent Labs Lab 09/09/16 0056  WBC 15.8*  HGB 11.6*  HCT 35.1*  MCV 66.7*  PLT 462*    Basic Metabolic Panel:  Recent Labs Lab 09/09/16 0056  NA 136  K 3.7  CL 104  CO2 20*  GLUCOSE 210*  BUN 13  CREATININE 0.99  CALCIUM 9.6    GFR: Estimated Creatinine Clearance: 89.6 mL/min (by C-G formula based on SCr of 0.99 mg/dL).  Liver Function Tests:  Recent Labs Lab 09/09/16 0056  AST 20  ALT 9*  ALKPHOS 60  BILITOT 0.5  PROT 8.5*  ALBUMIN 3.7    Recent Labs Lab 09/09/16 0056  LIPASE 30   No results for input(s): AMMONIA in  the last 168 hours.  Coagulation  Profile: No results for input(s): INR, PROTIME in the last 168 hours.  Cardiac Enzymes: No results for input(s): CKTOTAL, CKMB, CKMBINDEX, TROPONINI in the last 168 hours.  BNP (last 3 results) No results for input(s): PROBNP in the last 8760 hours.  HbA1C: No results for input(s): HGBA1C in the last 72 hours.  CBG: No results for input(s): GLUCAP in the last 168 hours.  Lipid Profile: No results for input(s): CHOL, HDL, LDLCALC, TRIG, CHOLHDL, LDLDIRECT in the last 72 hours.  Thyroid Function Tests: No results for input(s): TSH, T4TOTAL, FREET4, T3FREE, THYROIDAB in the last 72 hours.  Anemia Panel: No results for input(s): VITAMINB12, FOLATE, FERRITIN, TIBC, IRON, RETICCTPCT in the last 72 hours.  Urine analysis:    Component Value Date/Time   COLORURINE STRAW (A) 09/09/2016 0700   APPEARANCEUR CLEAR 09/09/2016 0700   LABSPEC 1.011 09/09/2016 0700   PHURINE 8.0 09/09/2016 0700   GLUCOSEU >=500 (A) 09/09/2016 0700   GLUCOSEU > 1000 mg/dL (A) 07/07/2008 2115   HGBUR NEGATIVE 09/09/2016 0700   HGBUR negative 08/12/2008 1444   BILIRUBINUR NEGATIVE 09/09/2016 0700   BILIRUBINUR Negative 02/18/2014 1134   KETONESUR 20 (A) 09/09/2016 0700   PROTEINUR 30 (A) 09/09/2016 0700   UROBILINOGEN 0.2 11/11/2014 1658   NITRITE NEGATIVE 09/09/2016 0700   LEUKOCYTESUR NEGATIVE 09/09/2016 0700    Sepsis Labs: @LABRCNTIP (procalcitonin:4,lacticidven:4) )No results found for this or any previous visit (from the past 240 hour(s)).   Radiological Exams on Admission: Dg Abdomen Acute W/chest  Result Date: 09/09/2016 CLINICAL DATA:  Abdominal pain and vomiting EXAM: DG ABDOMEN ACUTE W/ 1V CHEST COMPARISON:  Abdominal radiograph 08/11/2016 FINDINGS: There are no dilated bowel loops or free intraperitoneal air. No radiopaque calculi or other significant radiographic abnormality is seen. Heart size and mediastinal contours are within normal limits. Both lungs  are clear. IMPRESSION: Negative abdominal radiographs.  No acute cardiopulmonary disease. Electronically Signed   By: Ulyses Jarred M.D.   On: 09/09/2016 04:00    EKG: Independently reviewed.  Assessment/Plan Active Problems:   Intractable vomiting   Intractable vomiting with nausea   Poorly controlled type 2 diabetes mellitus with peripheral neuropathy (HCC)   Hyperlipidemia   OBESITY, MORBID   ANXIETY DEPRESSION   Polysubstance abuse   OBSTRUCTIVE SLEEP APNEA   Diabetic polyneuropathy (HCC)   Essential hypertension   Chronic ischemic heart disease   Asthma   IRREGULAR MENSTRUATION   Iron deficiency anemia   Diabetic gastroparesis associated with type 2 diabetes mellitus (HCC)   Nausea with vomiting   Gastroparesis   Abdominal pain in the setting of gastroparesis: h/o diabetic gastroparesis with recent admission for same, discharged with antiemetics which the patient has not been taking. Rule out organic versus central etiology . Patient received multiple antiemetic agents without significant improvement. Abd XR negative   WBC 15 . Lipase 30 Afebrile. BP in the 200s/100s which may contribute to symptoms  patient becomes more symptomatic as healthcare provider enters the room  Admit to inpatient IV fluids  Bowel rest  UDS, as in the past she tested positive for marijuana  Pharmacy to initiate Phenergan drip and IV Zofran prn for breakthrough Laxatives prn CT head rule out pineal versus other cerebral mass  If symptoms do not improve, will consider CT abdomen and pelvis  Mayy need to consider Psych evaluation;   Hypertensive urgency, in the setting of medicine non compliance   currently between 200s/100  IV Hydralazine prn for BP 200/100 Continue home meds when able to  tolerate po  Hyperlipidemia Continue home statins    Leukocytosis, likely reactive, related to inflammation, pain. WBC 15  Abd XR neg Afebrile . UA negative   Blood culture  IVF   Repeat CBC in  AM   Type II Diabetes Current blood sugar level is 210. Non compliant with meds  Lab Results  Component Value Date   HGBA1C 8.6 09/05/2016  SSI Heart healthy carb modified diet.  Anemia of chronic disease Hemoglobin on admission 11.6 .  At Baseline  Repeat CBC in am  No transfusion is indicated at this time Continue Iron supplements in am, as patient is unable to tolerate po and may increase her nausea and gastroparesis symptoms   GERD, no acute symptoms Continue PPI  Asthma, no current issues COntinue inhaler and O2 prn    OSA CPAP nightly    DVT prophylaxis: Lovenox   Code Status:   Full     Family Communication:  Discussed with patient Disposition Plan: Expect patient to be discharged to home after condition improves Consults called:     Admission status: IP tele  [place], PA-C [facility]   09/09/2016, 8:56 AM

## 2016-09-10 DIAGNOSIS — D509 Iron deficiency anemia, unspecified: Secondary | ICD-10-CM

## 2016-09-10 DIAGNOSIS — E1143 Type 2 diabetes mellitus with diabetic autonomic (poly)neuropathy: Principal | ICD-10-CM

## 2016-09-10 DIAGNOSIS — I10 Essential (primary) hypertension: Secondary | ICD-10-CM

## 2016-09-10 DIAGNOSIS — I160 Hypertensive urgency: Secondary | ICD-10-CM

## 2016-09-10 DIAGNOSIS — E1165 Type 2 diabetes mellitus with hyperglycemia: Secondary | ICD-10-CM

## 2016-09-10 DIAGNOSIS — K3184 Gastroparesis: Secondary | ICD-10-CM

## 2016-09-10 DIAGNOSIS — E785 Hyperlipidemia, unspecified: Secondary | ICD-10-CM

## 2016-09-10 DIAGNOSIS — E1142 Type 2 diabetes mellitus with diabetic polyneuropathy: Secondary | ICD-10-CM

## 2016-09-10 LAB — CBC
HEMATOCRIT: 32.2 % — AB (ref 36.0–46.0)
HEMOGLOBIN: 10.4 g/dL — AB (ref 12.0–15.0)
MCH: 21.7 pg — AB (ref 26.0–34.0)
MCHC: 32.3 g/dL (ref 30.0–36.0)
MCV: 67.2 fL — ABNORMAL LOW (ref 78.0–100.0)
PLATELETS: 411 10*3/uL — AB (ref 150–400)
RBC: 4.79 MIL/uL (ref 3.87–5.11)
RDW: 24.6 % — AB (ref 11.5–15.5)
WBC: 14.7 10*3/uL — ABNORMAL HIGH (ref 4.0–10.5)

## 2016-09-10 LAB — COMPREHENSIVE METABOLIC PANEL
ALBUMIN: 2.7 g/dL — AB (ref 3.5–5.0)
ALK PHOS: 50 U/L (ref 38–126)
ALT: 8 U/L — ABNORMAL LOW (ref 14–54)
ANION GAP: 8 (ref 5–15)
AST: 18 U/L (ref 15–41)
BILIRUBIN TOTAL: 0.6 mg/dL (ref 0.3–1.2)
BUN: 13 mg/dL (ref 6–20)
CALCIUM: 8.6 mg/dL — AB (ref 8.9–10.3)
CO2: 22 mmol/L (ref 22–32)
Chloride: 105 mmol/L (ref 101–111)
Creatinine, Ser: 1.07 mg/dL — ABNORMAL HIGH (ref 0.44–1.00)
GFR calc Af Amer: >60 mL/min (ref >60)
GFR calc non Af Amer: >60 mL/min (ref >60)
GLUCOSE: 137 mg/dL — AB (ref 65–99)
Potassium: 3.6 mmol/L (ref 3.5–5.1)
SODIUM: 135 mmol/L (ref 135–145)
Total Protein: 6.8 g/dL (ref 6.5–8.1)

## 2016-09-10 LAB — GLUCOSE, CAPILLARY
GLUCOSE-CAPILLARY: 150 mg/dL — AB (ref 65–99)
Glucose-Capillary: 161 mg/dL — ABNORMAL HIGH (ref 65–99)
Glucose-Capillary: 119 mg/dL — ABNORMAL HIGH (ref 65–99)
Glucose-Capillary: 183 mg/dL — ABNORMAL HIGH (ref 65–99)

## 2016-09-10 MED ORDER — PROMETHAZINE HCL 25 MG/ML IJ SOLN: 25.0000 mg | Freq: Four times a day (QID) | INTRAMUSCULAR | Status: DC | PRN

## 2016-09-10 MED ORDER — HYDRALAZINE HCL 20 MG/ML IJ SOLN
5.0000 mg | Freq: Three times a day (TID) | INTRAMUSCULAR | Status: DC | PRN
  Administered 2016-09-10 (×2): 5 mg via INTRAVENOUS
  Filled 2016-09-10 (×3): qty 1

## 2016-09-10 MED ORDER — PNEUMOCOCCAL VAC POLYVALENT 25 MCG/0.5ML IJ INJ
0.5000 mL | INJECTION | INTRAMUSCULAR | Status: DC
  Filled 2016-09-10: qty 0.5

## 2016-09-10 NOTE — Progress Notes (Signed)
Patient explained about the procedure and diagnosis for CT head but she Denies.

## 2016-09-10 NOTE — Progress Notes (Signed)
Patient is still refusing to have her head CT.

## 2016-09-10 NOTE — Progress Notes (Signed)
Patient refused CPAP use for the evening. RT will continue to monitor as needed.

## 2016-09-10 NOTE — Progress Notes (Addendum)
PROGRESS NOTE   DAWANNA GRAUBERGER  VZC:[id number]    DOB: 1976-04-06    DOA: 09/09/2016  PCP: Clent Demark, PA-C   I have briefly reviewed patients previous medical records in [facility].  Brief Narrative:  41 year old female, PMH of poorly controlled type II DM secondary to noncompliance complicated by diabetic gastroparesis and peripheral neuropathy, HTN, HLD, CVA, anxiety and depression, GERD, chronic hepatitis B, OSA noncompliant with CPAP, substance abuse (UDS + cocaine 05/29/16), iron deficiency anemia, recent admission 4/22-4/23 for diabetic gastroparesis, presented with intractable nausea, vomiting and abdominal pain and inability to keep any food or drinks down. Admitted for diabetic gastroparesis. In the ED, no significant relief despite Reglan, Compazine, Haldol and Phenergan. Briefly placed on IV Phenergan infusion-now off. Improving.   Assessment & Plan:   Active Problems:   Poorly controlled type 2 diabetes mellitus with peripheral neuropathy (HCC)   Hyperlipidemia   OBESITY, MORBID   ANXIETY DEPRESSION   Polysubstance abuse   OBSTRUCTIVE SLEEP APNEA   Diabetic polyneuropathy (HCC)   Essential hypertension   Chronic ischemic heart disease   Asthma   IRREGULAR MENSTRUATION   Iron deficiency anemia   Intractable vomiting   Diabetic gastroparesis associated with type 2 diabetes mellitus (HCC)   Nausea with vomiting   Intractable vomiting with nausea   Gastroparesis   1. Intractable nausea, vomiting and abdominal pain/diabetic gastroparesis: Likely due to diabetic gastroparesis. Low suspicion for acute GE. Abdominal x-ray without acute findings. Complicated by poorly controlled DM secondary to noncompliance. Recent hospitalization for same and did not pick up medications until day prior to admission. In the ED, no significant relief despite Reglan, Compazine, Haldol and Phenergan. Briefly placed on IV Phenergan infusion-now off. Improved. Continue Reglan, when  necessary IV Zofran and Phenergan, PPI. Start clear liquid diet and advance gradually as tolerated. UDS negative this admission. CT head ordered to rule out central etiology, low index of suspicion and patient refusing repeatedly to have CT done. 2. Dehydration: Secondary to GI losses and poor oral intake. Continue gentle IV fluid hydration for additional 24 hours until oral intake has improved. 3. Uncontrolled type II DM complicated by diabetic gastroparesis and peripheral neuropathy: Reasonable inpatient control on SSI, continue. Counseled extensively regarding compliance with medications. A1c 09/05/16:8.6. 4. Hypertensive urgency: Related to acute illness and noncompliance. Presented with blood pressures in the 200s/100. Improved. Continue HCTZ, metoprolol and losartan. May consider discontinuing HCTZ due to diabetes. 5. Hyperlipidemia: Continue statins. 6. Microcytic anemia/iron deficiency: Stable. Periodically follow CBCs. Iron supplements at discharge. Hold while having GI symptoms. 7. GERD: Continue PPI. 8. OSA: Non-compliant with CPAP at home. 9. Substance abuse (cocaine): Current UDS negative. 10. Leukocytosis: Possible stress response. Stable. Low clinical index of suspicion for infectious etiology. Chest x-ray negative. Urine analysis also not suggestive of UTI.   DVT prophylaxis: Lovenox Code Status: Full Family Communication: None at bedside Disposition: DC home when medically improved   Consultants:  None   Procedures:  None  Antimicrobials:  None    Subjective: Patient was interviewed and examined along with patient's female RN room. Feels somewhat better. Had some nausea and nonbloody emesis this morning. Denies abdominal pain. No BM in the last 48 hours. Passing flatus. Willing to try clear liquids.  ROS: No chest pain, dyspnea, headache, visual symptoms, dizziness or lightheadedness reported. First saw her new PCP on 5/15. Indicates that she picked up half of her  prescriptions from the pharmacy day prior to admission.  Objective:  Vitals:  09/09/16 1008 09/09/16 2108 09/10/16 0450 09/10/16 0809  BP: (!) 177/104 (!) 151/91 120/66 (!) 145/74  Pulse: 96 (!) 110 74 91  Resp:  18 18   Temp:  99.1 F (37.3 C) 99 F (37.2 C)   TempSrc:      SpO2:  99% 93%   Weight:      Height:        Examination:  General exam: Pleasant young female sitting up comfortably in bed. Respiratory system: Clear to auscultation. Respiratory effort normal. Cardiovascular system: S1 & S2 heard, RRR. No JVD, murmurs, rubs, gallops or clicks. No pedal edema. Telemetry: SR in the 90s-ST 110s. Gastrointestinal system: Abdomen is nondistended, soft and nontender. No organomegaly or masses felt. Normal bowel sounds heard. Central nervous system: Alert and oriented. No focal neurological deficits. Extremities: Symmetric 5 x 5 power. Skin: No rashes, lesions or ulcers Psychiatry: Judgement and insight appear normal. Mood & affect flat.    Data Reviewed: I have personally reviewed following labs and imaging studies  CBC:  Recent Labs Lab 09/09/16 0056 09/10/16 0309  WBC 15.8* 14.7*  HGB 11.6* 10.4*  HCT 35.1* 32.2*  MCV 66.7* 67.2*  PLT 462* 301*   Basic Metabolic Panel:  Recent Labs Lab 09/09/16 0056 09/10/16 0309  NA 136 135  K 3.7 3.6  CL 104 105  CO2 20* 22  GLUCOSE 210* 137*  BUN 13 13  CREATININE 0.99 1.07*  CALCIUM 9.6 8.6*   Liver Function Tests:  Recent Labs Lab 09/09/16 0056 09/10/16 0309  AST 20 18  ALT 9* 8*  ALKPHOS 60 50  BILITOT 0.5 0.6  PROT 8.5* 6.8  ALBUMIN 3.7 2.7*   CBG:  Recent Labs Lab 09/09/16 1205 09/09/16 1629 09/09/16 2106 09/10/16 0800  GLUCAP 225* 165* 140* 161*    No results found for this or any previous visit (from the past 240 hour(s)).       Radiology Studies: Dg Abdomen Acute W/chest  Result Date: 09/09/2016 CLINICAL DATA:  Abdominal pain and vomiting EXAM: DG ABDOMEN ACUTE W/ 1V CHEST  COMPARISON:  Abdominal radiograph 08/11/2016 FINDINGS: There are no dilated bowel loops or free intraperitoneal air. No radiopaque calculi or other significant radiographic abnormality is seen. Heart size and mediastinal contours are within normal limits. Both lungs are clear. IMPRESSION: Negative abdominal radiographs.  No acute cardiopulmonary disease. Electronically Signed   By: Ulyses Jarred M.D.   On: 09/09/2016 04:00        Scheduled Meds: . aspirin EC  81 mg Oral Daily  . enoxaparin (LOVENOX) injection  40 mg Subcutaneous Q24H  . ferrous sulfate  325 mg Oral BID WC  . hydrochlorothiazide  25 mg Oral Daily  . insulin aspart  0-15 Units Subcutaneous TID WC  . losartan  50 mg Oral Daily  . metoCLOPramide  10 mg Oral TID AC & HS  . metoprolol tartrate  50 mg Oral BID  . pantoprazole  40 mg Oral Daily  . pravastatin  10 mg Oral q1800   Continuous Infusions: . sodium chloride 100 mL/hr at 09/09/16 2055     LOS: 1 day     HONGALGI,ANAND, MD, FACP, FHM. [facility] Pager [phone]  If 7PM-7AM, please contact night-coverage [email or web address] Password [email or web address] 09/10/2016, 11:18 AM

## 2016-09-11 DIAGNOSIS — E876 Hypokalemia: Secondary | ICD-10-CM

## 2016-09-11 LAB — CBC
HCT: 31.1 % — ABNORMAL LOW (ref 36.0–46.0)
Hemoglobin: 10.1 g/dL — ABNORMAL LOW (ref 12.0–15.0)
MCH: 21.8 pg — AB (ref 26.0–34.0)
MCHC: 32.5 g/dL (ref 30.0–36.0)
MCV: 67.2 fL — AB (ref 78.0–100.0)
PLATELETS: 388 10*3/uL (ref 150–400)
RBC: 4.63 MIL/uL (ref 3.87–5.11)
RDW: 24.4 % — AB (ref 11.5–15.5)
WBC: 12.4 10*3/uL — AB (ref 4.0–10.5)

## 2016-09-11 LAB — BASIC METABOLIC PANEL
Anion gap: 9 (ref 5–15)
BUN: 15 mg/dL (ref 6–20)
CO2: 21 mmol/L — ABNORMAL LOW (ref 22–32)
CREATININE: 0.97 mg/dL (ref 0.44–1.00)
Calcium: 8.4 mg/dL — ABNORMAL LOW (ref 8.9–10.3)
Chloride: 104 mmol/L (ref 101–111)
GFR calc Af Amer: >60 mL/min (ref >60)
GLUCOSE: 150 mg/dL — AB (ref 65–99)
POTASSIUM: 3.3 mmol/L — AB (ref 3.5–5.1)
SODIUM: 134 mmol/L — AB (ref 135–145)

## 2016-09-11 LAB — GLUCOSE, CAPILLARY
GLUCOSE-CAPILLARY: 156 mg/dL — AB (ref 65–99)
GLUCOSE-CAPILLARY: 127 mg/dL — AB (ref 65–99)
Glucose-Capillary: 174 mg/dL — ABNORMAL HIGH (ref 65–99)
Glucose-Capillary: 128 mg/dL — ABNORMAL HIGH (ref 65–99)

## 2016-09-11 MED ORDER — PANTOPRAZOLE SODIUM 40 MG IV SOLR
40.0000 mg | INTRAVENOUS | Status: DC
  Administered 2016-09-11 – 2016-09-13 (×3): 40 mg via INTRAVENOUS
  Filled 2016-09-11 (×3): qty 40

## 2016-09-11 MED ORDER — POTASSIUM CHLORIDE CRYS ER 20 MEQ PO TBCR
40.0000 meq | EXTENDED_RELEASE_TABLET | Freq: Once | ORAL | Status: AC
  Administered 2016-09-11: 40 meq via ORAL
  Filled 2016-09-11: qty 2

## 2016-09-11 MED ORDER — METOCLOPRAMIDE HCL 5 MG/ML IJ SOLN
10.0000 mg | Freq: Four times a day (QID) | INTRAMUSCULAR | Status: DC
  Administered 2016-09-11 – 2016-09-13 (×9): 10 mg via INTRAVENOUS
  Filled 2016-09-11 (×9): qty 2

## 2016-09-11 NOTE — Progress Notes (Signed)
Inpatient Diabetes Program Recommendations  AACE/ADA: New Consensus Statement on Inpatient Glycemic Control (2015)  Target Ranges:  Prepandial:   less than 140 mg/dL      Peak postprandial:   less than 180 mg/dL (1-2 hours)      Critically ill patients:  140 - 180 mg/dL   Lab Results  Component Value Date   GLUCAP 174 (H) 09/11/2016   HGBA1C 8.6 09/05/2016    Review of Glycemic Control  Diabetes history: DM2 Outpatient Diabetes medications:  Novolin 70/30 15 units bid Current orders for Inpatient glycemic control: Novolog 0-15 units tidwc  According to EMR, pt gets her insulin from [facility]. Pt stated she ran out of insulin because she didn't have insulin at home. States she now has her insulin and meter at home. Pt was not interested in talking about her diabetes and HgbA1C.  On CL diet. Blood sugars trending well at present. Will likely need some basal insulin when eating.   Will continue to follow.  Thank you. Lorenda Peck, RD, LDN, CDE Inpatient Diabetes Coordinator [phone]

## 2016-09-11 NOTE — Care Management Note (Addendum)
Case Management Note  Patient Details  Name: KYRIANNA BARLETTA MRN: [id number] Date of Birth: [birth year]  Subjective/Objective:        Poorly controlled DM,  Polysubstance abuse, obstructive sleep apnea           Action/Plan: Discharge Planning: NCM spoke to pt and states she goes to [facility]. Last appt was on 09/05/2016. States she has some of her medications at home and some Rx waiting for her at the pharmacy. Pt next appt is on May 29 at 11am at [facility]. Pt states she has glucometer at home. States she has applied for SS disability but application is pending. Explained the importance of compliance with medical treatment weighs on decision for approval.   PCP GOMEZ, ROGER DAVID MD  Metoprolol 50 mg bid Ventolin inhaler Phenergan supp  Novolin 70/30  Insulin syringes, lancets Omeprazole  Are filled at [facility], with a $0 copay (free), waiting for pick up.    Expected Discharge Date:             Expected Discharge Plan:  Home/Self Care  In-House Referral:  NA  Discharge planning Services  CM Consult, Follow-up appt scheduled, [facility] Choice:  NA Choice offered to:  NA  DME Arranged:  N/A DME Agency:  NA  HH Arranged:  NA HH Agency:  NA  Status of Service:  Completed, signed off  If discussed at [place] of Stay Meetings, dates discussed:    Additional Comments:  Erenest Rasher, RN 09/11/2016, 2:46 PM

## 2016-09-11 NOTE — Progress Notes (Signed)
Pt refused CPAP

## 2016-09-11 NOTE — Progress Notes (Signed)
PROGRESS NOTE   Shirley [name]  HQI:[id number]    DOB: [birth year]    DOA: 09/09/2016  PCP: Clent Demark, PA-C   I have briefly reviewed patients previous medical records in [facility].  Brief Narrative:  41 year old female, PMH of poorly controlled type II DM secondary to noncompliance complicated by diabetic gastroparesis and peripheral neuropathy, HTN, HLD, CVA, anxiety and depression, GERD, chronic hepatitis B, OSA noncompliant with CPAP, substance abuse (UDS + cocaine 05/29/16), iron deficiency anemia, recent admission 4/22-4/23 for diabetic gastroparesis, presented with intractable nausea, vomiting and abdominal pain and inability to keep any food or drinks down. Admitted for diabetic gastroparesis. In the ED, no significant relief despite Reglan, Compazine, Haldol and Phenergan. Briefly placed on IV Phenergan infusion-now off. Slowly improving.   Assessment & Plan:   Active Problems:   Poorly controlled type 2 diabetes mellitus with peripheral neuropathy (HCC)   Hyperlipidemia   OBESITY, MORBID   ANXIETY DEPRESSION   Polysubstance abuse   OBSTRUCTIVE SLEEP APNEA   Diabetic polyneuropathy (HCC)   Essential hypertension   Chronic ischemic heart disease   Asthma   IRREGULAR MENSTRUATION   Iron deficiency anemia   Intractable vomiting   Diabetic gastroparesis associated with type 2 diabetes mellitus (HCC)   Nausea with vomiting   Intractable vomiting with nausea   Gastroparesis   1. Intractable nausea, vomiting and abdominal pain/diabetic gastroparesis: Likely due to diabetic gastroparesis. Low suspicion for acute GE. Abdominal x-ray without acute findings. Complicated by poorly controlled DM secondary to noncompliance. Recent hospitalization for same and did not pick up medications until day prior to admission. In the ED, no significant relief despite Reglan, Compazine, Haldol and Phenergan. Briefly placed on IV Phenergan infusion-now off. Slowly improving. Reports  ongoing nausea, couple of episodes of small nonbloody emesis overnight, not witnessed by nursing because she vomited in the trash can. Did not try clear liquids-encouraged to try today. Changed Reglan to IV 10 mg every 6 hours and Protonix to 40 mg IV daily. Continue when necessary IV Zofran, Phenergan. Advance diet as tolerated. UDS negative this admission. CT head ordered to rule out central etiology, low index of suspicion and patient refusing repeatedly to have CT done. 2. Dehydration: Secondary to GI losses and poor oral intake. Continue gentle IV fluid hydration for additional 24 hours until oral intake has improved. Improved. 3. Uncontrolled type II DM complicated by diabetic gastroparesis and peripheral neuropathy: Reasonable inpatient control on SSI, continue without change. Counseled extensively regarding compliance with medications. A1c 09/05/16:8.6. 4. Hypertensive urgency: Related to acute illness and noncompliance. Presented with blood pressures in the 200s/100. Improved. Continue HCTZ, metoprolol and losartan. May consider discontinuing HCTZ due to diabetes. Uncontrolled. Add when necessary IV hydralazine. 5. Hyperlipidemia: Continue statins without change. 6. Microcytic anemia/iron deficiency: Stable. Periodically follow CBCs. Iron supplements at discharge > Hold while having GI symptoms. 7. GERD: Continue PPI. 8. OSA: Non-compliant with CPAP at home. Refusing to wear CPAP in the hospital. 9. Substance abuse (cocaine): Current UDS negative. 10. Leukocytosis: Possible stress response. Stable. Low clinical index of suspicion for infectious etiology. Chest x-ray negative. Urine analysis also not suggestive of UTI. Improved without antibiotics. 11. Hypokalemia: Replace and follow.   DVT prophylaxis: Lovenox Code Status: Full Family Communication: Discussed with her boyfriend at bedside. Disposition: DC home when medically improved   Consultants:  None   Procedures:   None  Antimicrobials:  None    Subjective: Patient was interviewed and examined along with her female RN  in the room. Ongoing nausea. Reports unwitnessed couple of episodes of nonbloody mild emesis overnight. Some intermittent abdominal pain. Had BM yesterday without diarrhea or constipation. Ongoing intermittent voluntary generalized shaking, states due to pain.  ROS: No chest pain, dyspnea, headache, visual symptoms, dizziness or lightheadedness reported. First saw her new PCP on 5/15. Indicates that she picked up half of her prescriptions from the pharmacy day prior to admission.  Objective:  Vitals:   09/10/16 2251 09/10/16 2355 09/11/16 0555 09/11/16 0938  BP: (!) 175/104 (!) 147/88 120/65 (!) 158/96  Pulse: 96 (!) 106 97 (!) 115  Resp:   16   Temp:   98.3 F (36.8 C)   TempSrc:      SpO2:  100% 99%   Weight:      Height:        Examination:  General exam: Moderately built and obese young female, sitting up in bed, voluntary intermittent shaking of her lower extremities and trunk which she can stop when asked. Appears comfortable and in no pain or respiratory distress. Respiratory system: Clear to auscultation without wheezing, rhonchi or crackles. No increased work of breathing. Cardiovascular system: S1 and S2 heard, RRR. No JVD, murmurs or pedal edema.  Gastrointestinal system: Abdomen is nondistended, soft and nontender. Hyperactive bowel sounds heard. Central nervous system: Alert and oriented without focal neurological deficits. Extremities: Symmetric 5 x 5 power. No cyanosis clubbing or edema. Skin: No rashes, lesions or ulcers Psychiatry: Judgement and insight appear normal. Mood & affect flat.    Data Reviewed: I have personally reviewed following labs and imaging studies  CBC:  Recent Labs Lab 09/09/16 0056 09/10/16 0309 09/11/16 0508  WBC 15.8* 14.7* 12.4*  HGB 11.6* 10.4* 10.1*  HCT 35.1* 32.2* 31.1*  MCV 66.7* 67.2* 67.2*  PLT 462* 411* 510    Basic Metabolic Panel:  Recent Labs Lab 09/09/16 0056 09/10/16 0309 09/11/16 0508  NA 136 135 134*  K 3.7 3.6 3.3*  CL 104 105 104  CO2 20* 22 21*  GLUCOSE 210* 137* 150*  BUN 13 13 15   CREATININE 0.99 1.07* 0.97  CALCIUM 9.6 8.6* 8.4*   Liver Function Tests:  Recent Labs Lab 09/09/16 0056 09/10/16 0309  AST 20 18  ALT 9* 8*  ALKPHOS 60 50  BILITOT 0.5 0.6  PROT 8.5* 6.8  ALBUMIN 3.7 2.7*   CBG:  Recent Labs Lab 09/10/16 0800 09/10/16 1223 09/10/16 1726 09/10/16 2106 09/11/16 0732  GLUCAP 161* 150* 119* 183* 156*    Recent Results (from the past 240 hour(s))  Culture, blood (Routine X 2) w Reflex to ID Panel     Status: None (Preliminary result)   Collection Time: 09/09/16 10:14 AM  Result Value Ref Range Status   Specimen Description BLOOD LEFT ARM  Final   Special Requests IN PEDIATRIC BOTTLE Blood Culture adequate volume  Final   Culture NO GROWTH 2 DAYS  Final   Report Status PENDING  Incomplete  Culture, blood (Routine X 2) w Reflex to ID Panel     Status: None (Preliminary result)   Collection Time: 09/09/16 10:15 AM  Result Value Ref Range Status   Specimen Description BLOOD RIGHT ARM  Final   Special Requests IN PEDIATRIC BOTTLE Blood Culture adequate volume  Final   Culture NO GROWTH 2 DAYS  Final   Report Status PENDING  Incomplete         Radiology Studies: No results found.      Scheduled Meds: . aspirin  EC  81 mg Oral Daily  . enoxaparin (LOVENOX) injection  40 mg Subcutaneous Q24H  . hydrochlorothiazide  25 mg Oral Daily  . insulin aspart  0-15 Units Subcutaneous TID WC  . losartan  50 mg Oral Daily  . metoCLOPramide (REGLAN) injection  10 mg Intravenous Q6H  . metoprolol tartrate  50 mg Oral BID  . pantoprazole (PROTONIX) IV  40 mg Intravenous Q24H  . pneumococcal 23 valent vaccine  0.5 mL Intramuscular Tomorrow-1000  . pravastatin  10 mg Oral q1800   Continuous Infusions: . sodium chloride 100 mL/hr at 09/11/16 0626      LOS: 2 days     HONGALGI,ANAND, MD, FACP, FHM. [facility] Pager [phone]  If 7PM-7AM, please contact night-coverage [email or web address] Password [email or web address] 09/11/2016, 11:02 AM

## 2016-09-12 DIAGNOSIS — R112 Nausea with vomiting, unspecified: Secondary | ICD-10-CM

## 2016-09-12 LAB — BASIC METABOLIC PANEL
ANION GAP: 7 (ref 5–15)
BUN: 13 mg/dL (ref 6–20)
CO2: 23 mmol/L (ref 22–32)
Calcium: 8.5 mg/dL — ABNORMAL LOW (ref 8.9–10.3)
Chloride: 105 mmol/L (ref 101–111)
Creatinine, Ser: 0.89 mg/dL (ref 0.44–1.00)
GFR calc Af Amer: >60 mL/min (ref >60)
GFR calc non Af Amer: >60 mL/min (ref >60)
GLUCOSE: 138 mg/dL — AB (ref 65–99)
POTASSIUM: 3.5 mmol/L (ref 3.5–5.1)
Sodium: 135 mmol/L (ref 135–145)

## 2016-09-12 LAB — PREGNANCY, URINE: Preg Test, Ur: NEGATIVE

## 2016-09-12 LAB — GLUCOSE, CAPILLARY
Glucose-Capillary: 124 mg/dL — ABNORMAL HIGH (ref 65–99)
Glucose-Capillary: 158 mg/dL — ABNORMAL HIGH (ref 65–99)
Glucose-Capillary: 225 mg/dL — ABNORMAL HIGH (ref 65–99)
Glucose-Capillary: 228 mg/dL — ABNORMAL HIGH (ref 65–99)

## 2016-09-12 MED ORDER — LOSARTAN POTASSIUM 50 MG PO TABS
50.0000 mg | ORAL_TABLET | Freq: Every day | ORAL | Status: DC
  Administered 2016-09-12 – 2016-09-13 (×2): 50 mg via ORAL
  Filled 2016-09-12: qty 1

## 2016-09-12 MED ORDER — MEDROXYPROGESTERONE ACETATE 150 MG/ML IM SUSP
150.0000 mg | Freq: Once | INTRAMUSCULAR | Status: AC
  Administered 2016-09-12: 150 mg via INTRAMUSCULAR
  Filled 2016-09-12: qty 1

## 2016-09-12 MED ORDER — METOPROLOL TARTRATE 25 MG PO TABS
50.0000 mg | ORAL_TABLET | Freq: Two times a day (BID) | ORAL | Status: DC
  Administered 2016-09-12 – 2016-09-13 (×3): 50 mg via ORAL
  Filled 2016-09-12 (×2): qty 2

## 2016-09-12 NOTE — Progress Notes (Addendum)
PROGRESS NOTE   Shirley [name]  BOF:[id number]    DOB: 02-23-1976    DOA: 09/09/2016  PCP: Clent Demark, PA-C   I have briefly reviewed patients previous medical records in [facility].  Brief Narrative:  40 poorly controlled type II DM complicated by diabetic gastroparesis and peripheral neuropathy,  HTN,  HLD, CVA, Bipolar,  GERD,  chronic hepatitis B,  OSA noncompliant with CPAP, substance abuse (UDS + cocaine 05/29/16),  iron deficiency anemia, Menorrhagia for the past 3 yrs   recent admission 4/22-4/23 for diabetic gastroparesis, presented with intractable nausea, vomiting and abdominal pain and inability to keep any food or drinks down. Admitted for diabetic gastroparesis.   In the ED, no significant relief despite Reglan, Compazine, Haldol and Phenergan. Briefly placed on IV Phenergan infusion-now off. Slowly improving.   Assessment & Plan:   Active Problems:   Poorly controlled type 2 diabetes mellitus with peripheral neuropathy (HCC)   Hyperlipidemia   OBESITY, MORBID   ANXIETY DEPRESSION   Polysubstance abuse   OBSTRUCTIVE SLEEP APNEA   Diabetic polyneuropathy (HCC)   Essential hypertension   Chronic ischemic heart disease   Asthma   IRREGULAR MENSTRUATION   Iron deficiency anemia   Intractable vomiting   Diabetic gastroparesis associated with type 2 diabetes mellitus (HCC)   Nausea with vomiting   Intractable vomiting with nausea   Gastroparesis   Intractable nausea, vomiting and abdominal pain/diabetic gastroparesis--exacerbated by hypermenorrhea:  Low suspicion for acute GE. Abdominal x-ray without acute findings. Complicated by poorly controlled DM secondary to noncompliance. Recent hospitalization for same and did not pick up medications until day prior to admission. In ED, no significant relief despite Reglan, Compazine, Haldol and Phenergan. Briefly on IV Phenergan infusion-now off. Slowly improving. Reports ongoing nausea, couple of  episodes of small nonbloody emesis overnight, not witnessed by nursing because she vomited in the trash can. Ready to eat soft diet. Changed Reglan to IV 10 mg every 6 hours and Protonix to 40 mg IV daily. Continue when necessary IV Zofran, Phenergan. Advance diet as tolerated. UDS negative this admission. CT head ordered to rule out central etiology, low index of suspicion and patient refusing repeatedly to have CT done. Hypermenorrhea-get UDS.  Start depo-provera-explained might have non-cyclical bleeding but may stop the bleeding.  Will need OP follow up and probable Ultrasound Pelvis and work-up for the same with [facility] etc etc. Dehydration: Secondary to GI losses and poor oral intake. Continue gentle IV fluid hydration until eating solids-is drinking and is improving Uncontrolled type II DM complicated by diabetic gastroparesis and peripheral neuropathy: Reasonable inpatient control on SSI, continue without change. Counseled extensively regarding compliance with medications. A1c 09/05/16-suagrs are controlled Hypertensive urgency: Related to acute illness and noncompliance. Presented with blood pressures in the 200s/100. Improved. Continue HCTZ, metoprolol and losartan. May consider discontinuing HCTZ due to diabetes. Uncontrolled. Add when necessary IV hydralazine. Hyperlipidemia: Continue statins without change. Microcytic anemia/iron deficiency: Stable. Periodically follow CBCs. Iron supplements at discharge > Hold while having GI symptoms. Morbid obesity, Body mass index is 34.63 kg/m.-needs OP monitoring and weight loss strategies GERD: Continue PPI. OSA: Non-compliant with CPAP at home. Refusing to wear CPAP in the hospital. Substance abuse (cocaine): Current UDS negative. Leukocytosis: Possible stress response. Stable. Low clinical index of suspicion for infectious etiology. Chest x-ray negative. Urine analysis also not suggestive of UTI. Improved without antibiotics. Hypokalemia: Replace and  follow.   DVT prophylaxis: Lovenox Code Status: Full Family Communication: Discussed with patient alone  Disposition: DC home when medically improved   Consultants:  None   Procedures:  None  Antimicrobials:  None    Subjective:  Awake alert hungry No fever no chills no n/v having period Explains periods cause N/V  Objective:  Vitals:   09/11/16 2155 09/11/16 2156 09/11/16 2300 09/12/16 0539  BP: (!) 182/111 (!) 171/112 (!) 176/96 117/73  Pulse: (!) 118 (!) 115 98 87  Resp: 18   18  Temp: 98.9 F (37.2 C)   99.3 F (37.4 C)  TempSrc:    Oral  SpO2: 99% 100%  100%  Weight:      Height:        Examination:  General exam: Alert pleasant in nad No pallor ict mucosa is dry Neck is soft nt nd  cta b abd soft nt dn no rebound No le edema Neuro intact     Data Reviewed: I have personally reviewed following labs and imaging studies  CBC:  Recent Labs Lab 09/09/16 0056 09/10/16 0309 09/11/16 0508  WBC 15.8* 14.7* 12.4*  HGB 11.6* 10.4* 10.1*  HCT 35.1* 32.2* 31.1*  MCV 66.7* 67.2* 67.2*  PLT 462* 411* 009   Basic Metabolic Panel:  Recent Labs Lab 09/09/16 0056 09/10/16 0309 09/11/16 0508 09/12/16 0643  NA 136 135 134* 135  K 3.7 3.6 3.3* 3.5  CL 104 105 104 105  CO2 20* 22 21* 23  GLUCOSE 210* 137* 150* 138*  BUN 13 13 15 13   CREATININE 0.99 1.07* 0.97 0.89  CALCIUM 9.6 8.6* 8.4* 8.5*   Liver Function Tests:  Recent Labs Lab 09/09/16 0056 09/10/16 0309  AST 20 18  ALT 9* 8*  ALKPHOS 60 50  BILITOT 0.5 0.6  PROT 8.5* 6.8  ALBUMIN 3.7 2.7*   CBG:  Recent Labs Lab 09/11/16 0732 09/11/16 1208 09/11/16 1716 09/11/16 2155 09/12/16 0750  GLUCAP 156* 174* 128* 127* 124*    Recent Results (from the past 240 hour(s))  Culture, blood (Routine X 2) w Reflex to ID Panel     Status: None (Preliminary result)   Collection Time: 09/09/16 10:14 AM  Result Value Ref Range Status   Specimen Description BLOOD LEFT ARM  Final    Special Requests IN PEDIATRIC BOTTLE Blood Culture adequate volume  Final   Culture NO GROWTH 2 DAYS  Final   Report Status PENDING  Incomplete  Culture, blood (Routine X 2) w Reflex to ID Panel     Status: None (Preliminary result)   Collection Time: 09/09/16 10:15 AM  Result Value Ref Range Status   Specimen Description BLOOD RIGHT ARM  Final   Special Requests IN PEDIATRIC BOTTLE Blood Culture adequate volume  Final   Culture NO GROWTH 2 DAYS  Final   Report Status PENDING  Incomplete    Radiology Studies: No results found.  Scheduled Meds: . aspirin EC  81 mg Oral Daily  . enoxaparin (LOVENOX) injection  40 mg Subcutaneous Q24H  . insulin aspart  0-15 Units Subcutaneous TID WC  . losartan  50 mg Oral Daily  . medroxyPROGESTERone  150 mg Intramuscular Once  . metoCLOPramide (REGLAN) injection  10 mg Intravenous Q6H  . metoprolol tartrate  50 mg Oral BID  . pantoprazole (PROTONIX) IV  40 mg Intravenous Q24H  . pneumococcal 23 valent vaccine  0.5 mL Intramuscular Tomorrow-1000  . pravastatin  10 mg Oral q1800   Continuous Infusions: . sodium chloride 100 mL/hr at 09/12/16 0447     LOS: 3 days  Verneita Griffes, MD [phone]   If 7PM-7AM, please contact night-coverage [email or web address] Password [email or web address] 09/12/2016, 7:59 AM

## 2016-09-12 NOTE — Progress Notes (Signed)
See todays note thanks

## 2016-09-13 LAB — GLUCOSE, CAPILLARY
GLUCOSE-CAPILLARY: 263 mg/dL — AB (ref 65–99)
GLUCOSE-CAPILLARY: 373 mg/dL — AB (ref 65–99)

## 2016-09-13 MED ORDER — CLONIDINE HCL 0.1 MG PO TABS: 0.1000 mg | ORAL_TABLET | Freq: Every day | ORAL | 0 refills | Status: DC | PRN

## 2016-09-13 NOTE — Discharge Summary (Signed)
Physician Discharge Summary  Shirley [name] SHF:[id number] DOB: 1976-02-27 DOA: 09/09/2016  PCP: Clent Demark, PA-C  Admit date: 09/09/2016 Discharge date: 09/13/2016  Time spent: 35 minutes  Recommendations for Outpatient Follow-up:  1. Needs OP titration of insulin as per trends-has yet to pick upmeds from prior Hospital visit 2. Need sOP eval for Fibroids and referral-given 1 dose depo-provera this admission to see if this slows her bleeding-risks of breakthrough bleed etc explained-she might require provera tabs aroudn the time of painful heavy GU beed ~ 17th of each month to prevent DKA  Discharge Diagnoses:  Active Problems:   Poorly controlled type 2 diabetes mellitus with peripheral neuropathy (HCC)   Hyperlipidemia   OBESITY, MORBID   ANXIETY DEPRESSION   Polysubstance abuse   OBSTRUCTIVE SLEEP APNEA   Diabetic polyneuropathy (HCC)   Essential hypertension   Chronic ischemic heart disease   Asthma   IRREGULAR MENSTRUATION   Iron deficiency anemia   Intractable vomiting   Diabetic gastroparesis associated with type 2 diabetes mellitus (HCC)   Nausea with vomiting   Intractable vomiting with nausea   Gastroparesis   Discharge Condition: improved  Diet recommendation: hh low salt  Filed Weights   09/09/16 0050 09/09/16 0841  Weight: 99.3 kg (219 lb) 100.3 kg (221 lb 1.6 oz)    History of present illness:  40 poorly controlled type II DM complicated by diabetic gastroparesis and peripheral neuropathy,  HTN,  HLD, CVA, Bipolar,  GERD,  chronic hepatitis B,  OSA noncompliant with CPAP, substance abuse (UDS + cocaine 05/29/16),  iron deficiency anemia, Menorrhagia for the past 3 yrs   recent admission 4/22-4/23 for diabetic gastroparesis, presented with intractable nausea, vomiting and abdominal pain and inability to keep any food or drinks down. Admitted for diabetic gastroparesis.   In the ED, no significant relief despite Reglan, Compazine, Haldol  and Phenergan. Briefly placed on IV Phenergan infusion-now off. Slowly improving.  Hospital Course:  Intractable nausea, vomiting and abdominal pain/diabetic gastroparesis--exacerbated by hypermenorrhea:  Low suspicion for acute GE. Abdominal x-ray without acute findings. Complicated by poorly controlled DM secondary to noncompliance. Recent hospitalization-did not pick up medications until day prior to admission. In ED, no significant relief despite Reglan, Compazine, Haldol and Phenergan. Briefly on IV Phenergan infusion-now off. Changed Reglan to IV 10 mg every 6 hours and Protonix to 40 mg IV daily and on d/c put on oral reglan. . UDS negative this admission. CT head ordered to rule out central etiology, low index of suspicion and patient refusing repeatedly to have CT done. Hypermenorrhea-?Fibroids.  Start depo-provera-explained might have non-cyclical bleeding but may stop the bleeding.  Will need OP follow up and probable Ultrasound Pelvis and work-up for the same with [facility] etc etc. She has been told she has fibroids and should have a discussion as an OP regarding either surgical or medical methods of controlling periods  Dehydration: Secondary to GI losses and poor oral intake. Continue gentle IV fluid hydration until eating solids-is drinking and is improving Uncontrolled type II DM complicated by diabetic gastroparesis and peripheral neuropathy: Reasonable inpatient control on SSI, continue without change. Counseled extensively regarding compliance with medications. A1c 09/05/16-suagrs are controlled--reinforced to pick up insulin and supplies form Pharmacy-d/c home meds Insulin 70/30 15 u am and 15 u pm Hypertensive urgency: Related to acute illness and noncompliance. Presented with blood pressures in the 200s/100. Improved. Continue HCTZ, metoprolol and losartan. May consider discontinuing HCTZ due to diabetes. Uncontrolled. Adjust meds further as OP Hyperlipidemia:  Continue statins without  change. Microcytic anemia/iron deficiency: Stable. Periodically follow CBCs. Iron supplements at discharge > Hold while having GI symptoms.resuemd on d/c Morbid obesity, Body mass index is 34.63 kg/m.-needs OP monitoring and weight loss strategies GERD: Continue PPI. OSA: Non-compliant with CPAP at home. Refusing to wear CPAP in the hospital. Substance abuse (cocaine): Current UDS negative. Leukocytosis: Possible stress response. Stable. Low clinical index of suspicion for infectious etiology. Chest x-ray negative. Urine analysis also not suggestive of UTI. Improved without antibiotics. Hypokalemia: need OP labs-at last check was 3.5  Procedures:  none   Consultations:  none  Discharge Exam: Vitals:   09/13/16 0618 09/13/16 0837  BP: 124/67 116/60  Pulse: 90 86  Resp: 18   Temp: 98.1 F (36.7 C)     General: eomi ncat a [place] sleepy Cardiovascular: s1s2 no m/r/g Respiratory: clear without added sound abd soft nt nd no rebound no gaurd  Discharge Instructions   Discharge Instructions    Diet - low sodium heart healthy    Complete by:  As directed    Diet - low sodium heart healthy    Complete by:  As directed    Discharge instructions    Complete by:  As directed    Make sure that you get close folloqw up as an op for your bleeding which is probably 2/2 to fibroids-u will need consideration of provera or other meds to slow the fibroid bleeding For your blood pressure I have prescribed clonidine 0.1 for pressure above 170 systolic-try to get if possible a blood pressure cuff--they have the manual ones which cost about 15 $ Please pick up the rest of your meds as an OP and follow up qwith the new doc soon You will need lab work in about 12 week or so   Increase activity slowly    Complete by:  As directed    Increase activity slowly    Complete by:  As directed      Current Discharge Medication List    START taking these medications   Details  cloNIDine  (CATAPRES) 0.1 MG tablet Take 1 tablet (0.1 mg total) by mouth daily as needed. Take 1 tablet if bp above 017 systolic Qty: 60 tablet, Refills: 0      CONTINUE these medications which have NOT CHANGED   Details  acetaminophen (TYLENOL) 500 MG tablet Take 500 mg by mouth every 6 (six) hours as needed.    albuterol (PROVENTIL HFA;VENTOLIN HFA) 108 (90 Base) MCG/ACT inhaler Inhale 2 puffs into the lungs every 4 (four) hours as needed for wheezing or shortness of breath. Qty: 1 Inhaler, Refills: 5   Associated Diagnoses: Uncomplicated asthma, unspecified asthma severity, unspecified whether persistent    aspirin EC 81 MG tablet Take 1 tablet (81 mg total) by mouth daily. Qty: 90 tablet, Refills: 3   Associated Diagnoses: Essential hypertension    ferrous sulfate 325 (65 FE) MG tablet Take 1 tablet (325 mg total) by mouth 2 (two) times daily with a meal. Qty: 60 tablet, Refills: 0   Associated Diagnoses: Iron deficiency anemia, unspecified iron deficiency anemia type    glucose blood (FREESTYLE TEST STRIPS) test strip Use as instructed Qty: 100 each, Refills: 11   Associated Diagnoses: Type 2 diabetes mellitus with other specified complication, without long-term current use of insulin (HCC)    glucose monitoring kit (FREESTYLE) monitoring kit 1 each by Does not apply route 4 (four) times daily - after meals and at bedtime. 1 month  Diabetic Testing Supplies for QAC-QHS accuchecks. Qty: 1 each, Refills: 1   Associated Diagnoses: Type 2 diabetes mellitus with other specified complication, without long-term current use of insulin (HCC)    hydrochlorothiazide (HYDRODIURIL) 25 MG tablet Take 1 tablet (25 mg total) by mouth daily. Take on tablet in the morning. Qty: 90 tablet, Refills: 1   Associated Diagnoses: Essential hypertension    insulin NPH-regular Human (NOVOLIN 70/30) (70-30) 100 UNIT/ML injection 15 units in the morning and 15 units in the evening. Qty: 10 mL, Refills: 11    Associated Diagnoses: Type 2 diabetes mellitus with other specified complication, without long-term current use of insulin (HCC)    INSULIN SYRINGE .5CC/28G (INS SYRINGE/NEEDLE .5CC/28G) 28G X 1/2" 0.5 ML MISC Please provide 1 month supply Qty: 100 each, Refills: 11   Associated Diagnoses: Type 2 diabetes mellitus with other specified complication, without long-term current use of insulin (HCC)    Lancets (FREESTYLE) lancets Use as instructed Qty: 100 each, Refills: 11   Associated Diagnoses: Type 2 diabetes mellitus with other specified complication, without long-term current use of insulin (HCC)    losartan (COZAAR) 50 MG tablet Take 1 tablet (50 mg total) by mouth daily. Qty: 30 tablet, Refills: 5   Associated Diagnoses: Essential hypertension    lovastatin (MEVACOR) 20 MG tablet Take 1 tablet (20 mg total) by mouth at bedtime. Qty: 90 tablet, Refills: 1   Associated Diagnoses: Essential hypertension    metoCLOPramide (REGLAN) 10 MG tablet Take 1 tablet (10 mg total) by mouth 4 (four) times daily -  before meals and at bedtime. Qty: 120 tablet, Refills: 1   Associated Diagnoses: Diabetic gastroparesis (HCC)    metoprolol tartrate (LOPRESSOR) 50 MG tablet Take 1 tablet (50 mg total) by mouth 2 (two) times daily. Qty: 180 tablet, Refills: 1   Associated Diagnoses: Essential hypertension    omeprazole (PRILOSEC) 40 MG capsule Take 1 capsule (40 mg total) by mouth daily. Qty: 30 capsule, Refills: 2   Associated Diagnoses: Acid indigestion    promethazine (PHENERGAN) 25 MG suppository Place 1 suppository (25 mg total) rectally every 6 (six) hours as needed for refractory nausea / vomiting. Qty: 30 each, Refills: 0   Associated Diagnoses: Diabetic gastroparesis ([place])       Allergies  Allergen Reactions  . Lisinopril Nausea Only and Swelling  . Morphine And Related Itching and Swelling   Follow-up Information    [facility] Follow up.   Specialty:  Family  Medicine Why:  scheduled appointment 09/19/2016 at 11 am Contact information: [place] [id number] [phone]           The results of significant diagnostics from this hospitalization (including imaging, microbiology, ancillary and laboratory) are listed below for reference.    Significant Diagnostic Studies: Dg Abdomen Acute W/chest  Result Date: 09/09/2016 CLINICAL DATA:  Abdominal pain and vomiting EXAM: DG ABDOMEN ACUTE W/ 1V CHEST COMPARISON:  Abdominal radiograph 08/11/2016 FINDINGS: There are no dilated bowel loops or free intraperitoneal air. No radiopaque calculi or other significant radiographic abnormality is seen. Heart size and mediastinal contours are within normal limits. Both lungs are clear. IMPRESSION: Negative abdominal radiographs.  No acute cardiopulmonary disease. Electronically Signed   By: Ulyses Jarred M.D.   On: 09/09/2016 04:00    Microbiology: Recent Results (from the past 240 hour(s))  Culture, blood (Routine X 2) w Reflex to ID Panel     Status: None (Preliminary result)   Collection  Time: 09/09/16 10:14 AM  Result Value Ref Range Status   Specimen Description BLOOD LEFT ARM  Final   Special Requests IN PEDIATRIC BOTTLE Blood Culture adequate volume  Final   Culture NO GROWTH 3 DAYS  Final   Report Status PENDING  Incomplete  Culture, blood (Routine X 2) w Reflex to ID Panel     Status: None (Preliminary result)   Collection Time: 09/09/16 10:15 AM  Result Value Ref Range Status   Specimen Description BLOOD RIGHT ARM  Final   Special Requests IN PEDIATRIC BOTTLE Blood Culture adequate volume  Final   Culture NO GROWTH 3 DAYS  Final   Report Status PENDING  Incomplete     Labs: Basic Metabolic Panel:  Recent Labs Lab 09/09/16 0056 09/10/16 0309 09/11/16 0508 09/12/16 0643  NA 136 135 134* 135  K 3.7 3.6 3.3* 3.5  CL 104 105 104 105  CO2 20* 22 21* 23  GLUCOSE 210* 137* 150* 138*  BUN _0 CREATININE 0.99 1.07* 0.97 0.89  CALCIUM 9.6 8.6* 8.4* 8.5*   Liver Function Tests:  Recent Labs Lab 09/09/16 0056 09/10/16 0309  AST 20 18  ALT 9* 8*  ALKPHOS 60 50  BILITOT 0.5 0.6  PROT 8.5* 6.8  ALBUMIN 3.7 2.7*    Recent Labs Lab 09/09/16 0056  LIPASE 30   No results for input(s): AMMONIA in the last 168 hours. CBC:  Recent Labs Lab 09/09/16 0056 09/10/16 0309 09/11/16 0508  WBC 15.8* 14.7* 12.4*  HGB 11.6* 10.4* 10.1*  HCT 35.1* 32.2* 31.1*  MCV 66.7* 67.2* 67.2*  PLT 462* 411* 388   Cardiac Enzymes: No results for input(s): CKTOTAL, CKMB, CKMBINDEX, TROPONINI in the last 168 hours. BNP: BNP (last 3 results)  Recent Labs  11/27/15 0105  BNP 110.9*    ProBNP (last 3 results) No results for input(s): PROBNP in the last 8760 hours.  CBG:  Recent Labs Lab 09/12/16 1208 09/12/16 1743 09/12/16 2200 09/13/16 0758 09/13/16 1143  GLUCAP 158* 225* 228* 263* 373*       Signed:  Nita Sells MD   [facility] 09/13/2016, 12:09 PM

## 2016-09-13 NOTE — Progress Notes (Signed)
Shirley [name] to be D/C'dto home per MD order.  Discussed with the patient and all questions fully answered.  VSS, Skin clean, dry and intact without evidence of skin break down, no evidence of skin tears noted. IV catheter discontinued intact. Site without signs and symptoms of complications. Dressing and pressure applied.  An After Visit Summary was printed and given to the patient. Patient received prescription.  D/c education completed with patient/family including follow up instructions, medication list, d/c activities limitations if indicated, with other d/c instructions as indicated by MD - patient able to verbalize understanding, all questions fully answered.   Patient instructed to return to ED, call 911, or call MD for any changes in condition.   Patient escorted via [place], and D/C home via private auto.  Lynann Beaver 09/13/2016 2:31 PM

## 2016-09-13 NOTE — Progress Notes (Signed)
Inpatient Diabetes Program Recommendations  AACE/ADA: New Consensus Statement on Inpatient Glycemic Control (2015)  Target Ranges:  Prepandial:   less than 140 mg/dL      Peak postprandial:   less than 180 mg/dL (1-2 hours)      Critically ill patients:  140 - 180 mg/dL   Lab Results  Component Value Date   GLUCAP 263 (H) 09/13/2016   HGBA1C 8.6 09/05/2016    Review of Glycemic Control Blood sugars > 180 mg/dL. Needs basal insulin or reduced rate of home 70/30.   Inpatient Diabetes Program Recommendations:    Add Lantus 12 units QHS When po intake increases, change diet to CHO mod medium Add Novolog 4 units tidwc if po intake > 50%.  Has been counseled numerous times regarding her glucose control at home.  Continue to follow. Thank you. Lorenda Peck, RD, LDN, CDE Inpatient Diabetes Coordinator [phone]

## 2016-09-14 LAB — CULTURE, BLOOD (ROUTINE X 2)
CULTURE: NO GROWTH
Culture: NO GROWTH
SPECIAL REQUESTS: ADEQUATE
SPECIAL REQUESTS: ADEQUATE

## 2016-09-19 ENCOUNTER — Telehealth (INDEPENDENT_AMBULATORY_CARE_PROVIDER_SITE_OTHER): Payer: Self-pay | Admitting: *Deleted

## 2016-09-19 ENCOUNTER — Ambulatory Visit (INDEPENDENT_AMBULATORY_CARE_PROVIDER_SITE_OTHER): Payer: Self-pay | Admitting: Physician Assistant

## 2016-09-19 NOTE — Telephone Encounter (Signed)
-----   Message from Clent Demark, PA-C sent at 09/15/2016  4:42 PM EDT ----- Blood cultures showed no growth in 5 days.

## 2016-09-19 NOTE — Telephone Encounter (Signed)
Patient verified DOB Patient is aware of no growth being noted over the past 5 days. Patient expressed her understanding and had no further questions.

## 2016-09-23 ENCOUNTER — Observation Stay (HOSPITAL_COMMUNITY)
Admission: EM | Admit: 2016-09-23 | Discharge: 2016-09-23 | Disposition: A | Discharge status: Home / Self Care | Payer: Self-pay | Attending: Internal Medicine | Admitting: Internal Medicine

## 2016-09-23 ENCOUNTER — Emergency Department (HOSPITAL_COMMUNITY): Payer: Self-pay

## 2016-09-23 ENCOUNTER — Encounter (HOSPITAL_COMMUNITY): Payer: Self-pay

## 2016-09-23 DIAGNOSIS — G43A1 Cyclical vomiting, intractable: Secondary | ICD-10-CM

## 2016-09-23 DIAGNOSIS — Z885 Allergy status to narcotic agent status: Secondary | ICD-10-CM | POA: Insufficient documentation

## 2016-09-23 DIAGNOSIS — F329 Major depressive disorder, single episode, unspecified: Secondary | ICD-10-CM | POA: Insufficient documentation

## 2016-09-23 DIAGNOSIS — M5137 Other intervertebral disc degeneration, lumbosacral region: Secondary | ICD-10-CM | POA: Insufficient documentation

## 2016-09-23 DIAGNOSIS — G4733 Obstructive sleep apnea (adult) (pediatric): Secondary | ICD-10-CM | POA: Insufficient documentation

## 2016-09-23 DIAGNOSIS — Z7982 Long term (current) use of aspirin: Secondary | ICD-10-CM | POA: Insufficient documentation

## 2016-09-23 DIAGNOSIS — Z955 Presence of coronary angioplasty implant and graft: Secondary | ICD-10-CM | POA: Insufficient documentation

## 2016-09-23 DIAGNOSIS — I252 Old myocardial infarction: Secondary | ICD-10-CM | POA: Insufficient documentation

## 2016-09-23 DIAGNOSIS — K219 Gastro-esophageal reflux disease without esophagitis: Secondary | ICD-10-CM | POA: Insufficient documentation

## 2016-09-23 DIAGNOSIS — J45909 Unspecified asthma, uncomplicated: Secondary | ICD-10-CM | POA: Insufficient documentation

## 2016-09-23 DIAGNOSIS — E785 Hyperlipidemia, unspecified: Secondary | ICD-10-CM | POA: Insufficient documentation

## 2016-09-23 DIAGNOSIS — Z87891 Personal history of nicotine dependence: Secondary | ICD-10-CM | POA: Insufficient documentation

## 2016-09-23 DIAGNOSIS — D509 Iron deficiency anemia, unspecified: Secondary | ICD-10-CM | POA: Insufficient documentation

## 2016-09-23 DIAGNOSIS — R112 Nausea with vomiting, unspecified: Secondary | ICD-10-CM | POA: Diagnosis present

## 2016-09-23 DIAGNOSIS — G43909 Migraine, unspecified, not intractable, without status migrainosus: Secondary | ICD-10-CM | POA: Insufficient documentation

## 2016-09-23 DIAGNOSIS — Z8673 Personal history of transient ischemic attack (TIA), and cerebral infarction without residual deficits: Secondary | ICD-10-CM | POA: Insufficient documentation

## 2016-09-23 DIAGNOSIS — I2510 Atherosclerotic heart disease of native coronary artery without angina pectoris: Secondary | ICD-10-CM | POA: Insufficient documentation

## 2016-09-23 DIAGNOSIS — E1143 Type 2 diabetes mellitus with diabetic autonomic (poly)neuropathy: Principal | ICD-10-CM | POA: Insufficient documentation

## 2016-09-23 DIAGNOSIS — D259 Leiomyoma of uterus, unspecified: Secondary | ICD-10-CM | POA: Insufficient documentation

## 2016-09-23 DIAGNOSIS — Z79899 Other long term (current) drug therapy: Secondary | ICD-10-CM | POA: Insufficient documentation

## 2016-09-23 DIAGNOSIS — E669 Obesity, unspecified: Secondary | ICD-10-CM | POA: Insufficient documentation

## 2016-09-23 DIAGNOSIS — I10 Essential (primary) hypertension: Secondary | ICD-10-CM | POA: Insufficient documentation

## 2016-09-23 DIAGNOSIS — Z833 Family history of diabetes mellitus: Secondary | ICD-10-CM | POA: Insufficient documentation

## 2016-09-23 DIAGNOSIS — F419 Anxiety disorder, unspecified: Secondary | ICD-10-CM | POA: Insufficient documentation

## 2016-09-23 DIAGNOSIS — Z9114 Patient's other noncompliance with medication regimen: Secondary | ICD-10-CM | POA: Insufficient documentation

## 2016-09-23 DIAGNOSIS — Z888 Allergy status to other drugs, medicaments and biological substances status: Secondary | ICD-10-CM | POA: Insufficient documentation

## 2016-09-23 DIAGNOSIS — Z8619 Personal history of other infectious and parasitic diseases: Secondary | ICD-10-CM | POA: Insufficient documentation

## 2016-09-23 DIAGNOSIS — K3184 Gastroparesis: Secondary | ICD-10-CM | POA: Insufficient documentation

## 2016-09-23 DIAGNOSIS — Z794 Long term (current) use of insulin: Secondary | ICD-10-CM | POA: Insufficient documentation

## 2016-09-23 DIAGNOSIS — D473 Essential (hemorrhagic) thrombocythemia: Secondary | ICD-10-CM | POA: Insufficient documentation

## 2016-09-23 DIAGNOSIS — M797 Fibromyalgia: Secondary | ICD-10-CM | POA: Insufficient documentation

## 2016-09-23 DIAGNOSIS — G629 Polyneuropathy, unspecified: Secondary | ICD-10-CM | POA: Insufficient documentation

## 2016-09-23 DIAGNOSIS — Z9119 Patient's noncompliance with other medical treatment and regimen: Secondary | ICD-10-CM | POA: Insufficient documentation

## 2016-09-23 LAB — CBC
HEMATOCRIT: 31.9 % — AB (ref 36.0–46.0)
Hemoglobin: 10.6 g/dL — ABNORMAL LOW (ref 12.0–15.0)
MCH: 22.3 pg — ABNORMAL LOW (ref 26.0–34.0)
MCHC: 33.2 g/dL (ref 30.0–36.0)
MCV: 67.2 fL — AB (ref 78.0–100.0)
Platelets: 449 10*3/uL — ABNORMAL HIGH (ref 150–400)
RBC: 4.75 MIL/uL (ref 3.87–5.11)
RDW: 23.5 % — ABNORMAL HIGH (ref 11.5–15.5)
WBC: 19.6 10*3/uL — AB (ref 4.0–10.5)

## 2016-09-23 LAB — URINALYSIS, ROUTINE W REFLEX MICROSCOPIC
BACTERIA UA: NONE SEEN
BILIRUBIN URINE: NEGATIVE
Glucose, UA: >=500 mg/dL — AB
Ketones, ur: 20 mg/dL — AB
LEUKOCYTES UA: NEGATIVE
Nitrite: POSITIVE — AB
PH: 6 (ref 5.0–8.0)
Protein, ur: 100 mg/dL — AB
Specific Gravity, Urine: 1.038 — ABNORMAL HIGH (ref 1.005–1.030)

## 2016-09-23 LAB — COMPREHENSIVE METABOLIC PANEL
ALT: 9 U/L — ABNORMAL LOW (ref 14–54)
ANION GAP: 14 (ref 5–15)
AST: 18 U/L (ref 15–41)
Albumin: 3.5 g/dL (ref 3.5–5.0)
Alkaline Phosphatase: 55 U/L (ref 38–126)
BUN: 14 mg/dL (ref 6–20)
CHLORIDE: 105 mmol/L (ref 101–111)
CO2: 19 mmol/L — ABNORMAL LOW (ref 22–32)
Calcium: 9.6 mg/dL (ref 8.9–10.3)
Creatinine, Ser: 0.86 mg/dL (ref 0.44–1.00)
Glucose, Bld: 262 mg/dL — ABNORMAL HIGH (ref 65–99)
POTASSIUM: 3.9 mmol/L (ref 3.5–5.1)
Sodium: 138 mmol/L (ref 135–145)
TOTAL PROTEIN: 8.3 g/dL — AB (ref 6.5–8.1)
Total Bilirubin: 0.7 mg/dL (ref 0.3–1.2)

## 2016-09-23 LAB — I-STAT BETA HCG BLOOD, ED (MC, WL, AP ONLY)

## 2016-09-23 LAB — CBG MONITORING, ED: GLUCOSE-CAPILLARY: 295 mg/dL — AB (ref 65–99)

## 2016-09-23 LAB — LIPASE, BLOOD: LIPASE: 33 U/L (ref 11–51)

## 2016-09-23 MED ORDER — HALOPERIDOL LACTATE 5 MG/ML IJ SOLN
2.0000 mg | Freq: Once | INTRAMUSCULAR | Status: AC
  Administered 2016-09-23: 2 mg via INTRAVENOUS
  Filled 2016-09-23: qty 1

## 2016-09-23 MED ORDER — IOPAMIDOL (ISOVUE-300) INJECTION 61%
INTRAVENOUS | Status: AC
  Administered 2016-09-23: 100 mL
  Filled 2016-09-23: qty 100

## 2016-09-23 MED ORDER — ONDANSETRON 4 MG PO TBDP
ORAL_TABLET | ORAL | Status: AC
  Filled 2016-09-23: qty 1

## 2016-09-23 MED ORDER — SODIUM CHLORIDE 0.9 % IV BOLUS (SEPSIS)
1000.0000 mL | Freq: Once | INTRAVENOUS | Status: AC
  Administered 2016-09-23: 1000 mL via INTRAVENOUS

## 2016-09-23 MED ORDER — ONDANSETRON HCL 4 MG/2ML IJ SOLN
4.0000 mg | Freq: Once | INTRAMUSCULAR | Status: AC
  Administered 2016-09-23: 4 mg via INTRAVENOUS
  Filled 2016-09-23: qty 2

## 2016-09-23 MED ORDER — PROCHLORPERAZINE EDISYLATE 5 MG/ML IJ SOLN: 10.0000 mg | Freq: Four times a day (QID) | INTRAMUSCULAR | Status: DC | PRN

## 2016-09-23 MED ORDER — ONDANSETRON HCL 4 MG/2ML IJ SOLN
4.0000 mg | Freq: Once | INTRAMUSCULAR | Status: DC
  Filled 2016-09-23: qty 2

## 2016-09-23 MED ORDER — HALOPERIDOL LACTATE 5 MG/ML IJ SOLN
5.0000 mg | Freq: Once | INTRAMUSCULAR | Status: AC
  Administered 2016-09-23: 5 mg via INTRAVENOUS
  Filled 2016-09-23: qty 1

## 2016-09-23 MED ORDER — SODIUM CHLORIDE 0.9 % IV SOLN
12.5000 mg | INTRAVENOUS | Status: DC
  Filled 2016-09-23: qty 0.5

## 2016-09-23 MED ORDER — METOCLOPRAMIDE HCL 5 MG/ML IJ SOLN
10.0000 mg | Freq: Once | INTRAMUSCULAR | Status: AC
  Administered 2016-09-23: 10 mg via INTRAVENOUS
  Filled 2016-09-23: qty 2

## 2016-09-23 MED ORDER — ONDANSETRON 4 MG PO TBDP
4.0000 mg | ORAL_TABLET | Freq: Once | ORAL | Status: AC | PRN
  Administered 2016-09-23: 4 mg via ORAL

## 2016-09-23 MED ORDER — HALOPERIDOL 2 MG PO TABS
2.0000 mg | ORAL_TABLET | Freq: Once | ORAL | Status: DC
  Filled 2016-09-23: qty 1

## 2016-09-23 MED ORDER — PROCHLORPERAZINE EDISYLATE 5 MG/ML IJ SOLN
10.0000 mg | Freq: Once | INTRAMUSCULAR | Status: AC
  Administered 2016-09-23: 10 mg via INTRAVENOUS
  Filled 2016-09-23: qty 2

## 2016-09-23 NOTE — ED Triage Notes (Signed)
Pt presents with large emesis; pt states onset of abdominal pain at 3 am yesterday morning; pt shaking and diaphoretic at triage; unable to get accurate BP;  Pt a&ox 4 on arrival

## 2016-09-23 NOTE — ED Notes (Signed)
Pt had another episode of vomiting

## 2016-09-23 NOTE — ED Notes (Signed)
2 IV attempts unsuccessful. 

## 2016-09-23 NOTE — Consult Note (Signed)
Consult note   Shirley [name]:[id number] DOB: October 16, 1975 DOA: 09/23/2016   PCP: Clent Demark, PA-C   Patient coming from:  Home    Chief Complaint:    nausea and vomiting   HPI: Shirley [name] is a 41 y.o. female with medical history significant for diabetes, iron deficiency anemia, hypertension, hyperlipidemia, GERD, prior history of CVA, history of gastroparesis with recent admission for nausea and vomiting from 4/22 and on 5/19, anxiety, history of medical noncompliance, presenting again with intractable nausea and vomiting and abdominal pain over the last 48 hrs. She admits not taking her Reglan as directed. At the time of evaluation, the patient appears to be symptomatic, unable to provide detailed information. For ER physician's note, the patient has not been able to keep down any oral fluids or liquids. No diarrhea or hematochezia. No apparent headaches or vision changes. No dysphagia. No confusion or seizures. No part and shortness of breath, or chest pain or palpitations. No lower extremity swelling. Of note, the patient had on admission elevated blood pressure, however she has not been compliant with her meds, currently is being monitored. The patient was tachycardiac  improved with IV fluid administration. She was in a process of being admitted, orders were being retained. However, the patient adamantly decided to leave the hospital, she wants to be discharged.    ED Course:  BP (!) 142/79   Pulse (!) 114   Temp 99.2 F (37.3 C) (Oral)   Resp (!) 22   LMP 09/09/2016   SpO2 99%    glucose to 62 white count 19.6 hemoglobin 10.6 platelets 449  lipase pending Urine with positive nitrite EKG with sinus tachycardia without ACS.  hemoglobin A1 C was 10. 6 abdominal x-ray negative for acute intra-abdominal abnormalities. Lower chest without acute cardiopulmonary disease  patient received IV Reglan, IV compazine , IV Haldol, and  without improvement of her symptoms. She  also received 2 L of fluids.  Review of Systems:  As per HPI otherwise all other systems reviewed and are negative  Past Medical History:  Diagnosis Date  . Abscess of tunica vaginalis    10/09- Abundant S. aureus- sensitive to all abx  . Anxiety   . Blood dyscrasia   . CAD (coronary artery disease) 06/15/2006   s/p Subendocardial MI with PDA angioplasty(no stent) on 06/15/06 and relook  cath 06/19/06 showed patency of site. Cath 12/10- no restenosis or significant CAD progression  . CVA (cerebral vascular accident) ([place]) ~ 02/2014   denies residual on 04/22/2014  . CVA (cerebral vascular accident) [facility])    history of remote right cerebellar infarct noted on head CT at least since 10/2011  . Depression   . Diabetes mellitus type 2, uncontrolled, with complications ([place])   . Fibromyalgia   . Gastritis   . Gastroparesis    secondary to poorly controlled DM, last emptying study performed 01/2010  was normal but may be falsely positive as pt was on reglan  . GERD (gastroesophageal reflux disease)   . Hepatitis B, chronic (HCC)    Hep BeAb+,Hep B cAb+ & Hep BsAg+ (9/06)  . History of pyelonephritis    H/o GrpB Pyelonephritis (9/06) and UTI- 07/11- E.Coli, 12/10- GBS  . Hyperlipidemia   . Hypertension   . Iron deficiency anemia   . Irregular menses    Small ovarian follicles seen on LM(7/86)  . MI (myocardial infarction) ([place]) 05/2006   PDA percutaneous transluminal coronary angioplasty  . Migraine    "  weekly" (04/22/2014)  . N&V (nausea and vomiting)    Chronic. Unclear etiology with multiple admission and ED visits. CT abdomen with and without contrast (02/2011)  showed no acute process. Gastic Emptying scan (01/2010) was normal. Ultrasound of the abdomen was within normal limits. Hepatitis B viral load was undectable. HIV NR. EGD - gastritis, Hpylori + s/p Rx  . Obesity   . OSA (obstructive sleep apnea)    "suppose to wear mask but I don't" (04/22/2014)  . Peripheral neuropathy     . Pneumonia    "this is probably the 2nd or 3rd time I've had pneumonia" (04/22/2014)  . Recurrent boils   . Seasonal asthma   . Substance abuse   . Thrombocytosis ([place])    Hem/Onc suggested 2/2 chronic hepatits and/or iron deficiency anemia    Past Surgical History:  Procedure Laterality Date  . CESAREAN SECTION  1997  . CORONARY ANGIOPLASTY WITH STENT PLACEMENT  2008   "2 stents"  . ESOPHAGOGASTRODUODENOSCOPY N/A 04/23/2014   Procedure: ESOPHAGOGASTRODUODENOSCOPY (EGD);  Surgeon: Winfield Cunas., MD;  Location: [facility] ENDOSCOPY;  Service: Endoscopy;  Laterality: N/A;    Social History Social History   Social History  . Marital status: Single    Spouse name: N/A  . Number of children: 2  . Years of education: 11   Occupational History  . other     unemployed   Social History Main Topics  . Smoking status: Former Smoker    Types: Cigarettes    Quit date: 04/24/1996  . Smokeless tobacco: Never Used     Comment: quit smoking cigarettes age 46  . Alcohol use 0.0 oz/week     Comment: 04/22/2014 "might have a few drinks a month"  . Drug use: No     Comment: 04/22/2104 "quit drugs ~ 1-2 yr ago"  . Sexual activity: Yes    Birth control/ protection: None   Other Topics Concern  . 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   Also works at [employer] having to lift elderly individuals.           Allergies  Allergen Reactions  . Lisinopril Nausea Only and Swelling  . Morphine And Related Itching and Swelling    Family History  Problem Relation Age of Onset  . Diabetes Father       Prior to Admission medications   Medication Sig Start Date End Date Taking? Authorizing Provider  acetaminophen (TYLENOL) 500 MG tablet Take 500 mg by mouth every 6 (six) hours as needed.    [provider]  albuterol (PROVENTIL HFA;VENTOLIN HFA) 108 (90 Base) MCG/ACT inhaler Inhale 2  puffs into the lungs every 4 (four) hours as needed for wheezing or shortness of breath. 09/05/16   Clent Demark, PA-C  aspirin EC 81 MG tablet Take 1 tablet (81 mg total) by mouth daily. 09/05/16   Clent Demark, PA-C  ferrous sulfate 325 (65 FE) MG tablet Take 1 tablet (325 mg total) by mouth 2 (two) times daily with a meal. 09/05/16   Clent Demark, PA-C  glucose blood (FREESTYLE TEST STRIPS) test strip Use as instructed 09/05/16   Clent Demark, PA-C  glucose monitoring kit (FREESTYLE) monitoring kit 1 each by Does not apply route 4 (four) times daily - after meals and at bedtime. 1 month Diabetic Testing Supplies for QAC-QHS accuchecks. 09/05/16  Clent Demark, PA-C  hydrochlorothiazide (HYDRODIURIL) 25 MG tablet Take 1 tablet (25 mg total) by mouth daily. Take on tablet in the morning. 09/05/16   Clent Demark, PA-C  insulin NPH-regular Human (NOVOLIN 70/30) (70-30) 100 UNIT/ML injection 15 units in the morning and 15 units in the evening. 09/05/16   Clent Demark, PA-C  INSULIN SYRINGE .5CC/28G (INS SYRINGE/NEEDLE .5CC/28G) 28G X 1/2" 0.5 ML MISC Please provide 1 month supply 09/05/16   Clent Demark, PA-C  Lancets (FREESTYLE) lancets Use as instructed 09/05/16   Clent Demark, PA-C  losartan (COZAAR) 50 MG tablet Take 1 tablet (50 mg total) by mouth daily. 09/05/16   Clent Demark, PA-C  lovastatin (MEVACOR) 20 MG tablet Take 1 tablet (20 mg total) by mouth at bedtime. 09/05/16   Clent Demark, PA-C  metoCLOPramide (REGLAN) 10 MG tablet Take 1 tablet (10 mg total) by mouth 4 (four) times daily -  before meals and at bedtime. 09/05/16   Clent Demark, PA-C  metoprolol tartrate (LOPRESSOR) 50 MG tablet Take 1 tablet (50 mg total) by mouth 2 (two) times daily. 09/05/16   Clent Demark, PA-C  omeprazole (PRILOSEC) 40 MG capsule Take 1 capsule (40 mg total) by mouth daily. 09/05/16   Clent Demark, PA-C  promethazine (PHENERGAN) 25 MG  suppository Place 1 suppository (25 mg total) rectally every 6 (six) hours as needed for refractory nausea / vomiting. 09/05/16   Clent Demark, PA-C    Physical Exam:  Vitals:   09/23/16 1400 09/23/16 1430 09/23/16 1500 09/23/16 1530  BP: (!) 140/100 (!) 144/83 132/89 (!) 142/79  Pulse: (!) 123 (!) 118 (!) 119 (!) 114  Resp:      Temp:      TempSrc:      SpO2: 99% 99% 99% 99%   Constitutional: Ill appearing, diaphoretic  Eyes: PERRL, lids and conjunctivae normal ENMT: Mucous membranes are moist, without exudate or lesions  Neck: normal, supple, no masses, no thyromegaly Respiratory: clear to auscultation bilaterally, no wheezing, no crackles. Normal respiratory effort  Cardiovascular: Regular rate and rhythm, no murmurs, rubs or gallops. No extremity edema. 2+ pedal pulses. No carotid bruits.  Abdomen: Soft, mild tenderness on deep palpation  No hepatosplenomegaly. Bowel sounds positive.  Musculoskeletal: no clubbing / cyanosis. Moves all extremities Skin: no jaundice, No lesions.  Neurologic: Sensation intact  Strength equal in all extremities.  Psychiatric:   Alert and oriented x 3. Patient is very anxious, symptoms seem to be worse when healthcare provider enters the room   Labs on Admission: I have personally reviewed following labs and imaging studies  CBC:  Recent Labs Lab 09/23/16 0242  WBC 19.6*  HGB 10.6*  HCT 31.9*  MCV 67.2*  PLT 449*    Basic Metabolic Panel:  Recent Labs Lab 09/23/16 0242  NA 138  K 3.9  CL 105  CO2 19*  GLUCOSE 262*  BUN 14  CREATININE 0.86  CALCIUM 9.6    GFR: CrCl cannot be calculated (Unknown ideal weight.).  Liver Function Tests:  Recent Labs Lab 09/23/16 0242  AST 18  ALT 9*  ALKPHOS 55  BILITOT 0.7  PROT 8.3*  ALBUMIN 3.5    Recent Labs Lab 09/23/16 0242  LIPASE 33   No results for input(s): AMMONIA in the last 168 hours.  Coagulation Profile: No results for input(s): INR, PROTIME in the last 168  hours.  Cardiac Enzymes: No results for input(s): CKTOTAL, CKMB, CKMBINDEX,  TROPONINI in the last 168 hours.  BNP (last 3 results) No results for input(s): PROBNP in the last 8760 hours.  HbA1C: No results for input(s): HGBA1C in the last 72 hours.  CBG:  Recent Labs Lab 09/23/16 1211  GLUCAP 295*    Lipid Profile: No results for input(s): CHOL, HDL, LDLCALC, TRIG, CHOLHDL, LDLDIRECT in the last 72 hours.  Thyroid Function Tests: No results for input(s): TSH, T4TOTAL, FREET4, T3FREE, THYROIDAB in the last 72 hours.  Anemia Panel: No results for input(s): VITAMINB12, FOLATE, FERRITIN, TIBC, IRON, RETICCTPCT in the last 72 hours.  Urine analysis:    Component Value Date/Time   COLORURINE YELLOW 09/23/2016 1344   APPEARANCEUR CLEAR 09/23/2016 1344   LABSPEC 1.038 (H) 09/23/2016 1344   PHURINE 6.0 09/23/2016 1344   GLUCOSEU >=500 (A) 09/23/2016 1344   GLUCOSEU > 1000 mg/dL (A) 07/07/2008 2115   HGBUR SMALL (A) 09/23/2016 1344   HGBUR negative 08/12/2008 1444   BILIRUBINUR NEGATIVE 09/23/2016 1344   BILIRUBINUR Negative 02/18/2014 1134   KETONESUR 20 (A) 09/23/2016 1344   PROTEINUR 100 (A) 09/23/2016 1344   UROBILINOGEN 0.2 11/11/2014 1658   NITRITE POSITIVE (A) 09/23/2016 1344   LEUKOCYTESUR NEGATIVE 09/23/2016 1344    Sepsis Labs: @LABRCNTIP (WVPXTGGYIRSWN:4,OEVOJJKKXFG:1) )No results found for this or any previous visit (from the past 240 hour(s)).   Radiological Exams on Admission: Ct Abdomen Pelvis W Contrast  Result Date: 09/23/2016 CLINICAL DATA:  41 year old female with acute abdominal pain with nausea and vomiting for 1 day. EXAM: CT ABDOMEN AND PELVIS WITH CONTRAST TECHNIQUE: Multidetector CT imaging of the abdomen and pelvis was performed using the standard protocol following bolus administration of intravenous contrast. CONTRAST:  155m ISOVUE-300 IOPAMIDOL (ISOVUE-300) INJECTION 61% COMPARISON:  11/27/2015 CT and prior studies FINDINGS: Lower chest: No  acute abnormality Hepatobiliary: The liver and gallbladder are unremarkable. There is no evidence of biliary dilatation. Pancreas: Unremarkable Spleen: Unremarkable Adrenals/Urinary Tract: The kidneys, adrenal glands and bladder are unremarkable. Stomach/Bowel: Stomach is within normal limits. Appendix appears normal. No evidence of bowel wall thickening, distention, or inflammatory changes. Vascular/Lymphatic: No significant vascular findings are present. No enlarged abdominal or pelvic lymph nodes. Reproductive: 4.9 cm uterine fibroid again noted. No acute abnormalities. Other: No free fluid, focal collection or pneumoperitoneum. Musculoskeletal: No acute or significant osseous findings. Moderate degenerative disc disease at L5-S1 again noted. IMPRESSION: No evidence of acute abnormality. Uterine fibroid again noted. Electronically Signed   By: JMargarette CanadaM.D.   On: 09/23/2016 11:18    EKG: Independently reviewed.  Assessment/Plan Active Problems:   Intractable vomiting with nausea   Abdominal pain in the setting of gastroparesis: h/o diabetic gastroparesis with recent admission for same, in the setting of  uncontrolled HTN discharged with antiemetics which the patient has not been taking. During her last visit,  CT of the head was performed, but she refused to have Patient received multiple antiemetic agents without significant improvement. CT of the abdomen is without acute findings. WBC 19 . Lipase pending receipt 2 L of IV fluids. She was to be placed on Phenergan draped by pharmacy, but she decided to leave the hospital . ER physician is to discharge the patient from the ED.    Leukocytosis, likely reactive, and infection, in view of positive nitrite in urine, consistent with UTI. related to inflammation, pain. WBC 19  Recommend that the patient receive a prescription for an oral antibiotics prior to discharge as per EDP     WNorthern Light HealthE, PA-C/ Dr. SEvangeline Gula  [facility]   09/23/2016, 4:10 PM

## 2016-09-23 NOTE — ED Notes (Signed)
This RN walked in the room pt was on the toilet and already urinated. Pt also began throwing up again. MD aware

## 2016-09-23 NOTE — ED Provider Notes (Signed)
[facility] DEPT Provider Note   CSN: [id number] Arrival date & time: 09/23/16  0236     History   Chief Complaint Chief Complaint  Patient presents with  . Abdominal Pain  . Emesis    HPI Shirley [name] is a 41 y.o. female.  The history is provided by the patient.   Patient with a long-standing history of recurrent vomiting gastroparesis.  She presents emergency department with nausea vomiting over the past 48 hours.  She reports decreased oral intake.  She presents tachycardic.  She reports generalized diffuse abdominal pain.  No chest pain shortness of breath.  Denies back pain.  No urinary symptoms.  Denies fever.  No other complaints at this time.  Limited history secondary to ongoing pain and active vomiting while in the ER.  No blood noted in her vomit.  No coffee-ground emesis    Past Medical History:  Diagnosis Date  . Abscess of tunica vaginalis    10/09- Abundant S. aureus- sensitive to all abx  . Anxiety   . Blood dyscrasia   . CAD (coronary artery disease) 06/15/2006   s/p Subendocardial MI with PDA angioplasty(no stent) on 06/15/06 and relook  cath 06/19/06 showed patency of site. Cath 12/10- no restenosis or significant CAD progression  . CVA (cerebral vascular accident) ([place]) ~ 02/2014   denies residual on 04/22/2014  . CVA (cerebral vascular accident) [facility])    history of remote right cerebellar infarct noted on head CT at least since 10/2011  . Depression   . Diabetes mellitus type 2, uncontrolled, with complications ([place])   . Fibromyalgia   . Gastritis   . Gastroparesis    secondary to poorly controlled DM, last emptying study performed 01/2010  was normal but may be falsely positive as pt was on reglan  . GERD (gastroesophageal reflux disease)   . Hepatitis B, chronic (HCC)    Hep BeAb+,Hep B cAb+ & Hep BsAg+ (9/06)  . History of pyelonephritis    H/o GrpB Pyelonephritis (9/06) and UTI- 07/11- E.Coli, 12/10- GBS  . Hyperlipidemia   . Hypertension     . Iron deficiency anemia   . Irregular menses    Small ovarian follicles seen on UT(6/54)  . MI (myocardial infarction) ([place]) 05/2006   PDA percutaneous transluminal coronary angioplasty  . Migraine    "weekly" (04/22/2014)  . N&V (nausea and vomiting)    Chronic. Unclear etiology with multiple admission and ED visits. CT abdomen with and without contrast (02/2011)  showed no acute process. Gastic Emptying scan (01/2010) was normal. Ultrasound of the abdomen was within normal limits. Hepatitis B viral load was undectable. HIV NR. EGD - gastritis, Hpylori + s/p Rx  . Obesity   . OSA (obstructive sleep apnea)    "suppose to wear mask but I don't" (04/22/2014)  . Peripheral neuropathy   . Pneumonia    "this is probably the 2nd or 3rd time I've had pneumonia" (04/22/2014)  . Recurrent boils   . Seasonal asthma   . Substance abuse   . Thrombocytosis ([place])    Hem/Onc suggested 2/2 chronic hepatits and/or iron deficiency anemia    Patient Active Problem List   Diagnosis Date Noted  . Intractable nausea and vomiting 08/11/2016  . Type 2 diabetes mellitus with hyperglycemia ([place]) 04/07/2016  . Nausea & vomiting 04/07/2016  . Diabetic gastroparesis ([employer]) 04/06/2016  . Non-intractable cyclical vomiting with nausea   . Dehydration 03/27/2015  . Intractable vomiting with nausea 11/11/2014  . Gastroparesis 11/11/2014  .  Cough   . Hematemesis 10/13/2014  . DKA (diabetic ketoacidoses) ([place]) 10/13/2014  . Increased anion gap metabolic acidosis 31/54/0086  . Tachycardia 07/21/2014  . Abdominal pain, chronic, left lower quadrant   . Nausea with vomiting   . Diabetic gastroparesis associated with type 2 diabetes mellitus ([place]) 04/28/2014  . History of Helicobacter pylori infection 04/22/2014  . Vaginal discharge 02/18/2014  . Atypical chest pain 07/22/2013  . Unspecified constipation 07/21/2013  . Tinea corporis 07/21/2013  . Intractable vomiting 04/29/2013  . Dysmenorrhea 04/22/2013  .  Headache(784.0) 02/08/2012  . Health care maintenance 01/22/2012  . Chronic hepatitis B ([place]) 03/07/2011  . History of leukocytosis 04/06/2010  . THROMBOCYTOSIS 04/06/2010  . Polysubstance abuse 02/23/2010  . Iron deficiency anemia 11/22/2009  . PERIPHERAL NEUROPATHY 10/01/2009  . Hyperlipidemia 08/30/2009  . Diabetic polyneuropathy ([place]) 08/30/2009  . Hidradenitis (recurrent boils) 07/07/2008  . Depression 12/27/2007  . Abdominal pain, left lower quadrant 11/21/2007  . FIBROMYALGIA 10/30/2007  . BACK PAIN 04/01/2007  . OBSTRUCTIVE SLEEP APNEA 01/17/2007  . ANXIETY DEPRESSION 06/27/2006  . Chronic ischemic heart disease 06/15/2006  . OBESITY, MORBID 05/15/2006  . MIGRAINE HEADACHE 05/15/2006  . Asthma 05/15/2006  . Essential hypertension 01/16/2006  . IRREGULAR MENSTRUATION 01/16/2006  . PEDAL EDEMA 01/16/2006  . Poorly controlled type 2 diabetes mellitus with peripheral neuropathy ([place]) 01/16/1989    Past Surgical History:  Procedure Laterality Date  . CESAREAN SECTION  1997  . CORONARY ANGIOPLASTY WITH STENT PLACEMENT  2008   "2 stents"  . ESOPHAGOGASTRODUODENOSCOPY N/A 04/23/2014   Procedure: ESOPHAGOGASTRODUODENOSCOPY (EGD);  Surgeon: Winfield Cunas., MD;  Location: [facility] ENDOSCOPY;  Service: Endoscopy;  Laterality: N/A;    OB History    No data available       Home Medications    Prior to Admission medications   Medication Sig Start Date End Date Taking? Authorizing Provider  acetaminophen (TYLENOL) 500 MG tablet Take 500 mg by mouth every 6 (six) hours as needed.    [provider]  albuterol (PROVENTIL HFA;VENTOLIN HFA) 108 (90 Base) MCG/ACT inhaler Inhale 2 puffs into the lungs every 4 (four) hours as needed for wheezing or shortness of breath. 09/05/16   Clent Demark, PA-C  aspirin EC 81 MG tablet Take 1 tablet (81 mg total) by mouth daily. 09/05/16   Clent Demark, PA-C  cloNIDine (CATAPRES) 0.1 MG tablet Take 1 tablet (0.1 mg total) by  mouth daily as needed. Take 1 tablet if bp above 761 systolic 9/50/93 2/67/12  Nita Sells, MD  ferrous sulfate 325 (65 FE) MG tablet Take 1 tablet (325 mg total) by mouth 2 (two) times daily with a meal. 09/05/16   Clent Demark, PA-C  glucose blood (FREESTYLE TEST STRIPS) test strip Use as instructed 09/05/16   Clent Demark, PA-C  glucose monitoring kit (FREESTYLE) monitoring kit 1 each by Does not apply route 4 (four) times daily - after meals and at bedtime. 1 month Diabetic Testing Supplies for QAC-QHS accuchecks. 09/05/16   Clent Demark, PA-C  hydrochlorothiazide (HYDRODIURIL) 25 MG tablet Take 1 tablet (25 mg total) by mouth daily. Take on tablet in the morning. 09/05/16   Clent Demark, PA-C  insulin NPH-regular Human (NOVOLIN 70/30) (70-30) 100 UNIT/ML injection 15 units in the morning and 15 units in the evening. 09/05/16   Clent Demark, PA-C  INSULIN SYRINGE .5CC/28G (INS SYRINGE/NEEDLE .5CC/28G) 28G X 1/2" 0.5 ML MISC Please provide 1 month supply 09/05/16  Clent Demark, PA-C  Lancets (FREESTYLE) lancets Use as instructed 09/05/16   Clent Demark, PA-C  losartan (COZAAR) 50 MG tablet Take 1 tablet (50 mg total) by mouth daily. 09/05/16   Clent Demark, PA-C  lovastatin (MEVACOR) 20 MG tablet Take 1 tablet (20 mg total) by mouth at bedtime. 09/05/16   Clent Demark, PA-C  metoCLOPramide (REGLAN) 10 MG tablet Take 1 tablet (10 mg total) by mouth 4 (four) times daily -  before meals and at bedtime. 09/05/16   Clent Demark, PA-C  metoprolol tartrate (LOPRESSOR) 50 MG tablet Take 1 tablet (50 mg total) by mouth 2 (two) times daily. 09/05/16   Clent Demark, PA-C  omeprazole (PRILOSEC) 40 MG capsule Take 1 capsule (40 mg total) by mouth daily. 09/05/16   Clent Demark, PA-C  promethazine (PHENERGAN) 25 MG suppository Place 1 suppository (25 mg total) rectally every 6 (six) hours as needed for refractory nausea / vomiting. 09/05/16    Clent Demark, PA-C    Family History Family History  Problem Relation Age of Onset  . Diabetes Father     Social History Social History  Substance Use Topics  . Smoking status: Former Smoker    Types: Cigarettes    Quit date: 04/24/1996  . Smokeless tobacco: Never Used     Comment: quit smoking cigarettes age 54  . Alcohol use 0.0 oz/week     Comment: 04/22/2014 "might have a few drinks a month"     Allergies   Lisinopril and Morphine and related   Review of Systems Review of Systems  All other systems reviewed and are negative.    Physical Exam Updated Vital Signs BP (!) 163/102   Pulse (!) 119   Temp 99.2 F (37.3 C) (Oral)   Resp (!) 22   LMP 09/09/2016   SpO2 98%   Physical Exam  Constitutional: She is oriented to person, place, and time. She appears well-developed and well-nourished. She appears distressed.  HENT:  Head: Normocephalic and atraumatic.  Eyes: EOM are normal.  Neck: Normal range of motion.  Cardiovascular: Regular rhythm and normal heart sounds.   Vomiting  Pulmonary/Chest: Effort normal and breath sounds normal.  Abdominal: Soft. She exhibits no distension.  Generalized abdominal tenderness.  No peritoneal signs  Musculoskeletal: Normal range of motion.  Neurological: She is alert and oriented to person, place, and time.  Skin: Skin is warm. She is diaphoretic.  Psychiatric: She has a normal mood and affect. Judgment normal.  Nursing note and vitals reviewed.    ED Treatments / Results  Labs (all labs ordered are listed, but only abnormal results are displayed) Labs Reviewed  COMPREHENSIVE METABOLIC PANEL - Abnormal; Notable for the following:       Result Value   CO2 19 (*)    Glucose, Bld 262 (*)    Total Protein 8.3 (*)    ALT 9 (*)    All other components within normal limits  CBC - Abnormal; Notable for the following:    WBC 19.6 (*)    Hemoglobin 10.6 (*)    HCT 31.9 (*)    MCV 67.2 (*)    MCH 22.3 (*)    RDW  23.5 (*)    Platelets 449 (*)    All other components within normal limits  LIPASE, BLOOD  URINALYSIS, ROUTINE W REFLEX MICROSCOPIC  POC URINE PREG, ED    EKG  EKG Interpretation None  Radiology No results found.  Procedures Procedures (including critical care time)  Medications Ordered in ED Medications  haloperidol lactate (HALDOL) injection 2 mg (not administered)  ondansetron (ZOFRAN-ODT) disintegrating tablet 4 mg (4 mg Oral Given 09/23/16 0247)  haloperidol lactate (HALDOL) injection 2 mg (2 mg Intravenous Given 09/23/16 0537)  prochlorperazine (COMPAZINE) injection 10 mg (10 mg Intravenous Given 09/23/16 0537)  sodium chloride 0.9 % bolus 1,000 mL (0 mLs Intravenous Stopped 09/23/16 0639)  sodium chloride 0.9 % bolus 1,000 mL (1,000 mLs Intravenous New Bag/Given 09/23/16 0640)  metoCLOPramide (REGLAN) injection 10 mg (10 mg Intravenous Given 09/23/16 0727)     Initial Impression / Assessment and Plan / ED Course  I have reviewed the triage vital signs and the nursing notes.  Pertinent labs & imaging results that were available during my care of the patient were reviewed by me and considered in my medical decision making (see chart for details).     Symptom control this time.  Initial improvement with IV Haldol.  Now back to vomiting again.  Additional Haldol will be given.  Ongoing IV fluids to help a tachycardia.  She continues to complain of abdominal pain.  She will receive some pain medication this time.  She will need a CT scan of her abdomen and pelvis to further evaluate given her elevated white count, tachycardia, ongoing abdominal pain.  I suspect however much of this is still a flare of her gastroparesis.  Symptom control this time.  7:44 AM Care to Dr Lita Mains to follow-up on imaging, response IV fluids, symptom management  Final Clinical Impressions(s) / ED Diagnoses   Final diagnoses:  None    New Prescriptions New Prescriptions   No medications on  file     [name], MD 09/23/16 [phone]

## 2016-09-23 NOTE — ED Notes (Signed)
Pt had an episode of emesis.

## 2016-09-23 NOTE — ED Notes (Signed)
Pt not willing to stay still for BP to be taken

## 2016-09-23 NOTE — ED Provider Notes (Addendum)
Patient with persistent nausea and vomiting despite multiple doses of antiemetics. CT without any acute findings. Discussed with hospitalist and will admit for intractable nausea and vomiting.   Julianne Rice, MD 09/23/16 1524  Hospitalist came to see the patient and the patient refused admission. Advised to follow-up in the health and wellness clinic.   Julianne Rice, MD 09/23/16 [phone]

## 2016-09-23 NOTE — ED Notes (Signed)
MD made aware of BP and constant tachycardia.

## 2016-09-23 NOTE — ED Notes (Signed)
Pt was unable to get UA sample

## 2016-09-25 ENCOUNTER — Emergency Department (HOSPITAL_COMMUNITY)
Admission: EM | Admit: 2016-09-25 | Discharge: 2016-09-25 | Disposition: A | Discharge status: Home / Self Care | Payer: Medicaid Other | Attending: Emergency Medicine | Admitting: Emergency Medicine

## 2016-09-25 ENCOUNTER — Encounter (HOSPITAL_COMMUNITY): Payer: Self-pay | Admitting: Emergency Medicine

## 2016-09-25 DIAGNOSIS — E1142 Type 2 diabetes mellitus with diabetic polyneuropathy: Secondary | ICD-10-CM | POA: Insufficient documentation

## 2016-09-25 DIAGNOSIS — I10 Essential (primary) hypertension: Secondary | ICD-10-CM | POA: Insufficient documentation

## 2016-09-25 DIAGNOSIS — Z8673 Personal history of transient ischemic attack (TIA), and cerebral infarction without residual deficits: Secondary | ICD-10-CM | POA: Insufficient documentation

## 2016-09-25 DIAGNOSIS — Z794 Long term (current) use of insulin: Secondary | ICD-10-CM | POA: Insufficient documentation

## 2016-09-25 DIAGNOSIS — J45909 Unspecified asthma, uncomplicated: Secondary | ICD-10-CM | POA: Insufficient documentation

## 2016-09-25 DIAGNOSIS — Z955 Presence of coronary angioplasty implant and graft: Secondary | ICD-10-CM | POA: Insufficient documentation

## 2016-09-25 DIAGNOSIS — I2510 Atherosclerotic heart disease of native coronary artery without angina pectoris: Secondary | ICD-10-CM | POA: Insufficient documentation

## 2016-09-25 DIAGNOSIS — E1143 Type 2 diabetes mellitus with diabetic autonomic (poly)neuropathy: Secondary | ICD-10-CM | POA: Insufficient documentation

## 2016-09-25 DIAGNOSIS — I252 Old myocardial infarction: Secondary | ICD-10-CM | POA: Insufficient documentation

## 2016-09-25 DIAGNOSIS — Z87891 Personal history of nicotine dependence: Secondary | ICD-10-CM | POA: Insufficient documentation

## 2016-09-25 DIAGNOSIS — Z7982 Long term (current) use of aspirin: Secondary | ICD-10-CM | POA: Insufficient documentation

## 2016-09-25 DIAGNOSIS — K3184 Gastroparesis: Secondary | ICD-10-CM

## 2016-09-25 LAB — CBC
HEMATOCRIT: 31.4 % — AB (ref 36.0–46.0)
HEMOGLOBIN: 10.4 g/dL — AB (ref 12.0–15.0)
MCH: 22.6 pg — AB (ref 26.0–34.0)
MCHC: 33.1 g/dL (ref 30.0–36.0)
MCV: 68.1 fL — AB (ref 78.0–100.0)
Platelets: 381 10*3/uL (ref 150–400)
RBC: 4.61 MIL/uL (ref 3.87–5.11)
RDW: 23.5 % — ABNORMAL HIGH (ref 11.5–15.5)
WBC: 13.3 10*3/uL — ABNORMAL HIGH (ref 4.0–10.5)

## 2016-09-25 LAB — URINALYSIS, ROUTINE W REFLEX MICROSCOPIC
Bilirubin Urine: NEGATIVE
Glucose, UA: >=500 mg/dL — AB
Ketones, ur: 20 mg/dL — AB
Nitrite: NEGATIVE
PROTEIN: 100 mg/dL — AB
Specific Gravity, Urine: 1.017 (ref 1.005–1.030)
pH: 6 (ref 5.0–8.0)

## 2016-09-25 LAB — COMPREHENSIVE METABOLIC PANEL
ALBUMIN: 3.5 g/dL (ref 3.5–5.0)
ALK PHOS: 53 U/L (ref 38–126)
ALT: 10 U/L — ABNORMAL LOW (ref 14–54)
ANION GAP: 8 (ref 5–15)
AST: 23 U/L (ref 15–41)
BUN: 12 mg/dL (ref 6–20)
CALCIUM: 9.1 mg/dL (ref 8.9–10.3)
CO2: 24 mmol/L (ref 22–32)
Chloride: 103 mmol/L (ref 101–111)
Creatinine, Ser: 0.9 mg/dL (ref 0.44–1.00)
GFR calc Af Amer: >60 mL/min (ref >60)
GFR calc non Af Amer: >60 mL/min (ref >60)
GLUCOSE: 273 mg/dL — AB (ref 65–99)
Potassium: 4.1 mmol/L (ref 3.5–5.1)
SODIUM: 135 mmol/L (ref 135–145)
Total Bilirubin: 0.5 mg/dL (ref 0.3–1.2)
Total Protein: 7.8 g/dL (ref 6.5–8.1)

## 2016-09-25 LAB — LIPASE, BLOOD: Lipase: 21 U/L (ref 11–51)

## 2016-09-25 MED ORDER — SODIUM CHLORIDE 0.9 % IV BOLUS (SEPSIS)
2000.0000 mL | Freq: Once | INTRAVENOUS | Status: AC
  Administered 2016-09-25: 2000 mL via INTRAVENOUS

## 2016-09-25 MED ORDER — HALOPERIDOL LACTATE 5 MG/ML IJ SOLN
5.0000 mg | Freq: Once | INTRAMUSCULAR | Status: AC
  Administered 2016-09-25: 5 mg via INTRAVENOUS
  Filled 2016-09-25: qty 1

## 2016-09-25 MED ORDER — PROMETHAZINE HCL 25 MG PO TABS: 25.0000 mg | ORAL_TABLET | Freq: Three times a day (TID) | ORAL | 0 refills | Status: DC | PRN

## 2016-09-25 MED ORDER — HALOPERIDOL LACTATE 5 MG/ML IJ SOLN: 5.0000 mg | Freq: Once | INTRAMUSCULAR | Status: DC

## 2016-09-25 MED ORDER — PROMETHAZINE HCL 25 MG/ML IJ SOLN
25.0000 mg | Freq: Once | INTRAMUSCULAR | Status: AC
  Administered 2016-09-25: 25 mg via INTRAVENOUS
  Filled 2016-09-25: qty 1

## 2016-09-25 MED ORDER — FENTANYL CITRATE (PF) 100 MCG/2ML IJ SOLN
100.0000 ug | Freq: Once | INTRAMUSCULAR | Status: AC
  Administered 2016-09-25: 100 ug via INTRAVENOUS
  Filled 2016-09-25: qty 2

## 2016-09-25 NOTE — Discharge Instructions (Signed)
Return here as needed.  Follow-up with your primary doctor. °

## 2016-09-25 NOTE — ED Provider Notes (Signed)
[facility] DEPT Provider Note   CSN: [id number] Arrival date & time: 09/25/16  1140     History   Chief Complaint Chief Complaint  Patient presents with  . Abdominal Pain    HPI Shirley [name] is a 41 y.o. female.  HPI Patient presents to the emergency department with nausea, vomiting and abdominal discomfort.  The patient states that she has a history of gastroparesis and that she started vomiting last night.  The patient states that her symptoms have gotten worse throughout the day today.  The patient states nothing seems make the condition better.  Patient states that she did not take any medications prior to arrivalThe patient denies chest pain, shortness of breath, headache,blurred vision, neck pain, fever, cough, weakness, numbness, dizziness, anorexia, edema, diarrhea, rash, back pain, dysuria, hematemesis, bloody stool, near syncope, or syncope. Past Medical History:  Diagnosis Date  . Abscess of tunica vaginalis    10/09- Abundant S. aureus- sensitive to all abx  . Anxiety   . Blood dyscrasia   . CAD (coronary artery disease) 06/15/2006   s/p Subendocardial MI with PDA angioplasty(no stent) on 06/15/06 and relook  cath 06/19/06 showed patency of site. Cath 12/10- no restenosis or significant CAD progression  . CVA (cerebral vascular accident) ([place]) ~ 02/2014   denies residual on 04/22/2014  . CVA (cerebral vascular accident) [facility])    history of remote right cerebellar infarct noted on head CT at least since 10/2011  . Depression   . Diabetes mellitus type 2, uncontrolled, with complications ([place])   . Fibromyalgia   . Gastritis   . Gastroparesis    secondary to poorly controlled DM, last emptying study performed 01/2010  was normal but may be falsely positive as pt was on reglan  . GERD (gastroesophageal reflux disease)   . Hepatitis B, chronic (HCC)    Hep BeAb+,Hep B cAb+ & Hep BsAg+ (9/06)  . History of pyelonephritis    H/o GrpB Pyelonephritis (9/06) and UTI-  07/11- E.Coli, 12/10- GBS  . Hyperlipidemia   . Hypertension   . Iron deficiency anemia   . Irregular menses    Small ovarian follicles seen on XB(3/53)  . MI (myocardial infarction) ([place]) 05/2006   PDA percutaneous transluminal coronary angioplasty  . Migraine    "weekly" (04/22/2014)  . N&V (nausea and vomiting)    Chronic. Unclear etiology with multiple admission and ED visits. CT abdomen with and without contrast (02/2011)  showed no acute process. Gastic Emptying scan (01/2010) was normal. Ultrasound of the abdomen was within normal limits. Hepatitis B viral load was undectable. HIV NR. EGD - gastritis, Hpylori + s/p Rx  . Obesity   . OSA (obstructive sleep apnea)    "suppose to wear mask but I don't" (04/22/2014)  . Peripheral neuropathy   . Pneumonia    "this is probably the 2nd or 3rd time I've had pneumonia" (04/22/2014)  . Recurrent boils   . Seasonal asthma   . Substance abuse   . Thrombocytosis ([place])    Hem/Onc suggested 2/2 chronic hepatits and/or iron deficiency anemia    Patient Active Problem List   Diagnosis Date Noted  . Nausea and vomiting 09/23/2016  . Intractable nausea and vomiting 08/11/2016  . Type 2 diabetes mellitus with hyperglycemia ([place]) 04/07/2016  . Nausea & vomiting 04/07/2016  . Diabetic gastroparesis ([place]) 04/06/2016  . Non-intractable cyclical vomiting with nausea   . Dehydration 03/27/2015  . Intractable vomiting with nausea 11/11/2014  . Gastroparesis 11/11/2014  .  Cough   . Hematemesis 10/13/2014  . DKA (diabetic ketoacidoses) ([place]) 10/13/2014  . Increased anion gap metabolic acidosis 65/06/5463  . Tachycardia 07/21/2014  . Abdominal pain, chronic, left lower quadrant   . Nausea with vomiting   . Diabetic gastroparesis associated with type 2 diabetes mellitus ([place]) 04/28/2014  . History of Helicobacter pylori infection 04/22/2014  . Vaginal discharge 02/18/2014  . Atypical chest pain 07/22/2013  . Unspecified constipation 07/21/2013   . Tinea corporis 07/21/2013  . Intractable vomiting 04/29/2013  . Dysmenorrhea 04/22/2013  . UTI (urinary tract infection) 07/15/2012  . Headache(784.0) 02/08/2012  . Health care maintenance 01/22/2012  . Chronic hepatitis B ([place]) 03/07/2011  . History of leukocytosis 04/06/2010  . THROMBOCYTOSIS 04/06/2010  . Polysubstance abuse 02/23/2010  . Iron deficiency anemia 11/22/2009  . PERIPHERAL NEUROPATHY 10/01/2009  . Hyperlipidemia 08/30/2009  . Diabetic polyneuropathy ([place]) 08/30/2009  . Hidradenitis (recurrent boils) 07/07/2008  . Depression 12/27/2007  . Abdominal pain, left lower quadrant 11/21/2007  . FIBROMYALGIA 10/30/2007  . BACK PAIN 04/01/2007  . OBSTRUCTIVE SLEEP APNEA 01/17/2007  . ANXIETY DEPRESSION 06/27/2006  . Chronic ischemic heart disease 06/15/2006  . OBESITY, MORBID 05/15/2006  . MIGRAINE HEADACHE 05/15/2006  . Asthma 05/15/2006  . Essential hypertension 01/16/2006  . IRREGULAR MENSTRUATION 01/16/2006  . PEDAL EDEMA 01/16/2006  . Poorly controlled type 2 diabetes mellitus with peripheral neuropathy ([place]) 01/16/1989    Past Surgical History:  Procedure Laterality Date  . CESAREAN SECTION  1997  . CORONARY ANGIOPLASTY WITH STENT PLACEMENT  2008   "2 stents"  . ESOPHAGOGASTRODUODENOSCOPY N/A 04/23/2014   Procedure: ESOPHAGOGASTRODUODENOSCOPY (EGD);  Surgeon: Winfield Cunas., MD;  Location: [facility] ENDOSCOPY;  Service: Endoscopy;  Laterality: N/A;    OB History    No data available       Home Medications    Prior to Admission medications   Medication Sig Start Date End Date Taking? Authorizing Provider  acetaminophen (TYLENOL) 500 MG tablet Take 500 mg by mouth every 6 (six) hours as needed.    [provider]  albuterol (PROVENTIL HFA;VENTOLIN HFA) 108 (90 Base) MCG/ACT inhaler Inhale 2 puffs into the lungs every 4 (four) hours as needed for wheezing or shortness of breath. 09/05/16   Clent Demark, PA-C  aspirin EC 81 MG tablet Take 1  tablet (81 mg total) by mouth daily. 09/05/16   Clent Demark, PA-C  cloNIDine (CATAPRES) 0.1 MG tablet Take 1 tablet (0.1 mg total) by mouth daily as needed. Take 1 tablet if bp above 681 systolic 2/75/17 0/0/17  Nita Sells, MD  ferrous sulfate 325 (65 FE) MG tablet Take 1 tablet (325 mg total) by mouth 2 (two) times daily with a meal. 09/05/16   Clent Demark, PA-C  glucose blood (FREESTYLE TEST STRIPS) test strip Use as instructed 09/05/16   Clent Demark, PA-C  glucose monitoring kit (FREESTYLE) monitoring kit 1 each by Does not apply route 4 (four) times daily - after meals and at bedtime. 1 month Diabetic Testing Supplies for QAC-QHS accuchecks. 09/05/16   Clent Demark, PA-C  hydrochlorothiazide (HYDRODIURIL) 25 MG tablet Take 1 tablet (25 mg total) by mouth daily. Take on tablet in the morning. 09/05/16   Clent Demark, PA-C  insulin NPH-regular Human (NOVOLIN 70/30) (70-30) 100 UNIT/ML injection 15 units in the morning and 15 units in the evening. 09/05/16   Clent Demark, PA-C  INSULIN SYRINGE .5CC/28G (INS SYRINGE/NEEDLE .5CC/28G) 28G X 1/2" 0.5 ML MISC Please  provide 1 month supply 09/05/16   Clent Demark, PA-C  Lancets (FREESTYLE) lancets Use as instructed 09/05/16   Clent Demark, PA-C  losartan (COZAAR) 50 MG tablet Take 1 tablet (50 mg total) by mouth daily. 09/05/16   Clent Demark, PA-C  lovastatin (MEVACOR) 20 MG tablet Take 1 tablet (20 mg total) by mouth at bedtime. 09/05/16   Clent Demark, PA-C  metoCLOPramide (REGLAN) 10 MG tablet Take 1 tablet (10 mg total) by mouth 4 (four) times daily -  before meals and at bedtime. 09/05/16   Clent Demark, PA-C  metoprolol tartrate (LOPRESSOR) 50 MG tablet Take 1 tablet (50 mg total) by mouth 2 (two) times daily. 09/05/16   Clent Demark, PA-C  omeprazole (PRILOSEC) 40 MG capsule Take 1 capsule (40 mg total) by mouth daily. 09/05/16   Clent Demark, PA-C  promethazine  (PHENERGAN) 25 MG suppository Place 1 suppository (25 mg total) rectally every 6 (six) hours as needed for refractory nausea / vomiting. 09/05/16   Clent Demark, PA-C    Family History Family History  Problem Relation Age of Onset  . Diabetes Father     Social History Social History  Substance Use Topics  . Smoking status: Former Smoker    Types: Cigarettes    Quit date: 04/24/1996  . Smokeless tobacco: Never Used     Comment: quit smoking cigarettes age 37  . Alcohol use 0.0 oz/week     Comment: 04/22/2014 "might have a few drinks a month"     Allergies   Lisinopril and Morphine and related   Review of Systems Review of Systems All other systems negative except as documented in the HPI. All pertinent positives and negatives as reviewed in the HPI.  Physical Exam Updated Vital Signs BP (!) 179/89   Pulse (!) 107   Temp 98.2 F (36.8 C) (Oral)   Resp 18   Ht 5' 5" (1.651 m)   Wt 95.3 kg (210 lb)   LMP 09/09/2016   SpO2 100%   BMI 34.95 kg/m   Physical Exam  Constitutional: She is oriented to person, place, and time. She appears well-developed and well-nourished. No distress.  HENT:  Head: Normocephalic and atraumatic.  Mouth/Throat: Oropharynx is clear and moist.  Eyes: Pupils are equal, round, and reactive to light.  Neck: Normal range of motion. Neck supple.  Cardiovascular: Normal rate, regular rhythm and normal heart sounds.  Exam reveals no gallop and no friction rub.   No murmur heard. Pulmonary/Chest: Effort normal and breath sounds normal. No respiratory distress. She has no wheezes.  Abdominal: Soft. Bowel sounds are normal. She exhibits no distension and no mass. There is tenderness. There is no rebound and no guarding.  Musculoskeletal: She exhibits no edema.  Neurological: She is alert and oriented to person, place, and time. She exhibits normal muscle tone. Coordination normal.  Skin: Skin is warm and dry. Capillary refill takes less than 2  seconds. No rash noted. No erythema.  Psychiatric: She has a normal mood and affect. Her behavior is normal.  Nursing note and vitals reviewed.    ED Treatments / Results  Labs (all labs ordered are listed, but only abnormal results are displayed) Labs Reviewed  COMPREHENSIVE METABOLIC PANEL - Abnormal; Notable for the following:       Result Value   Glucose, Bld 273 (*)    ALT 10 (*)    All other components within normal limits  CBC - Abnormal;  Notable for the following:    WBC 13.3 (*)    Hemoglobin 10.4 (*)    HCT 31.4 (*)    MCV 68.1 (*)    MCH 22.6 (*)    RDW 23.5 (*)    All other components within normal limits  LIPASE, BLOOD  URINALYSIS, ROUTINE W REFLEX MICROSCOPIC    EKG  EKG Interpretation None       Radiology No results found.  Procedures Procedures (including critical care time)  Medications Ordered in ED Medications  fentaNYL (SUBLIMAZE) injection 100 mcg (100 mcg Intravenous Given 09/25/16 1256)  sodium chloride 0.9 % bolus 2,000 mL (2,000 mLs Intravenous New Bag/Given 09/25/16 1256)  promethazine (PHENERGAN) injection 25 mg (25 mg Intravenous Given 09/25/16 1256)  haloperidol lactate (HALDOL) injection 5 mg (5 mg Intravenous Given 09/25/16 1411)     Initial Impression / Assessment and Plan / ED Course  I have reviewed the triage vital signs and the nursing notes.  Pertinent labs & imaging results that were available during my care of the patient were reviewed by me and considered in my medical decision making (see chart for details).     Patient is feeling considerably better following Haldol, Phenergan and fentanyl.  The patient is also given 2 L of fluid.  She is not have any signs of overwhelming dehydration.  Patient is advised she will need to follow-up with her primary care Dr. told to return here as needed.  Patient agrees the plan and all questions were answered.  Final Clinical Impressions(s) / ED Diagnoses   Final diagnoses:  None     New Prescriptions New Prescriptions   No medications on file     Dalia Heading, Hershal Coria 09/25/16 1527    Orlie Dakin, MD 09/25/16 [phone]

## 2016-09-25 NOTE — ED Triage Notes (Addendum)
Per EMS pt having LL abdominal pain that is a 10/10.  Stated it started yesterday 2 pm.  And has not been relieved by medication.  Pt was seen this past Saturday in ED and d/c home.  PTA:  BP 212/140, Pulse 120, Spo2 96 on RA, RR 16, and CBG 268

## 2016-09-27 ENCOUNTER — Encounter (INDEPENDENT_AMBULATORY_CARE_PROVIDER_SITE_OTHER): Payer: Self-pay | Admitting: Physician Assistant

## 2016-09-27 ENCOUNTER — Ambulatory Visit (INDEPENDENT_AMBULATORY_CARE_PROVIDER_SITE_OTHER): Payer: Self-pay | Admitting: Physician Assistant

## 2016-09-27 VITALS — BP 125/88 | HR 100 | Temp 99.2°F | Wt 213.6 lb

## 2016-09-27 DIAGNOSIS — I10 Essential (primary) hypertension: Secondary | ICD-10-CM

## 2016-09-27 DIAGNOSIS — R112 Nausea with vomiting, unspecified: Secondary | ICD-10-CM

## 2016-09-27 DIAGNOSIS — J45909 Unspecified asthma, uncomplicated: Secondary | ICD-10-CM

## 2016-09-27 DIAGNOSIS — K30 Functional dyspepsia: Secondary | ICD-10-CM

## 2016-09-27 DIAGNOSIS — E1143 Type 2 diabetes mellitus with diabetic autonomic (poly)neuropathy: Secondary | ICD-10-CM

## 2016-09-27 DIAGNOSIS — E1169 Type 2 diabetes mellitus with other specified complication: Secondary | ICD-10-CM

## 2016-09-27 DIAGNOSIS — K3184 Gastroparesis: Secondary | ICD-10-CM

## 2016-09-27 DIAGNOSIS — L0291 Cutaneous abscess, unspecified: Secondary | ICD-10-CM

## 2016-09-27 LAB — GLUCOSE, POCT (MANUAL RESULT ENTRY)
POC GLUCOSE: 375 mg/dL — AB (ref 70–99)
POC Glucose: 316 mg/dl — AB (ref 70–99)

## 2016-09-27 MED ORDER — PROMETHAZINE HCL 25 MG PO TABS: 25.0000 mg | ORAL_TABLET | Freq: Three times a day (TID) | ORAL | 0 refills | Status: DC | PRN

## 2016-09-27 MED ORDER — ERYTHROMYCIN ETHYLSUCCINATE 200 MG/5ML PO SUSR: 100.0000 mg | Freq: Three times a day (TID) | ORAL | 0 refills | Status: AC

## 2016-09-27 MED ORDER — METOCLOPRAMIDE HCL 10 MG PO TABS: 10.0000 mg | ORAL_TABLET | Freq: Three times a day (TID) | ORAL | 1 refills | Status: DC

## 2016-09-27 MED ORDER — HYDROCHLOROTHIAZIDE 25 MG PO TABS: 25.0000 mg | ORAL_TABLET | Freq: Every day | ORAL | 1 refills | Status: DC

## 2016-09-27 MED ORDER — INSULIN ASPART 100 UNIT/ML ~~LOC~~ SOLN: 10.0000 [IU] | Freq: Once | SUBCUTANEOUS | 0 refills | Status: DC

## 2016-09-27 MED ORDER — CLINDAMYCIN PHOSPHATE 1 % EX GEL: Freq: Two times a day (BID) | CUTANEOUS | 0 refills | Status: DC

## 2016-09-27 MED ORDER — LOVASTATIN 20 MG PO TABS: 20.0000 mg | ORAL_TABLET | Freq: Every day | ORAL | 1 refills | Status: DC

## 2016-09-27 MED ORDER — OMEPRAZOLE 40 MG PO CPDR: 40.0000 mg | DELAYED_RELEASE_CAPSULE | Freq: Every day | ORAL | 1 refills | Status: DC

## 2016-09-27 MED ORDER — ASPIRIN EC 81 MG PO TBEC: 81.0000 mg | DELAYED_RELEASE_TABLET | Freq: Every day | ORAL | 3 refills | Status: DC

## 2016-09-27 MED ORDER — ALBUTEROL SULFATE HFA 108 (90 BASE) MCG/ACT IN AERS: 2.0000 | INHALATION_SPRAY | RESPIRATORY_TRACT | 5 refills | Status: DC | PRN

## 2016-09-27 MED ORDER — "INSULIN SYRINGE 28G X 1/2"" 0.5 ML MISC": 11 refills | Status: DC

## 2016-09-27 MED ORDER — FREESTYLE LANCETS MISC: 11 refills | Status: DC

## 2016-09-27 MED ORDER — ONDANSETRON 4 MG PO TBDP
4.0000 mg | ORAL_TABLET | Freq: Once | ORAL | Status: AC
  Administered 2016-09-27: 4 mg via ORAL

## 2016-09-27 MED ORDER — LOSARTAN POTASSIUM 50 MG PO TABS: 50.0000 mg | ORAL_TABLET | Freq: Every day | ORAL | 1 refills | Status: DC

## 2016-09-27 MED ORDER — INSULIN NPH ISOPHANE & REGULAR (70-30) 100 UNIT/ML ~~LOC~~ SUSP: SUBCUTANEOUS | 11 refills | Status: DC

## 2016-09-27 MED ORDER — METOPROLOL TARTRATE 50 MG PO TABS: 50.0000 mg | ORAL_TABLET | Freq: Two times a day (BID) | ORAL | 1 refills | Status: DC

## 2016-09-27 MED FILL — HYDROCHLOROTHIAZIDE 25 MG T: 25 | 30 days supply | Qty: 30 | Fill #0

## 2016-09-27 MED FILL — CLINDAMYCIN PH 1% GEL: 1 | 7 days supply | Qty: 30 | Fill #0

## 2016-09-27 MED FILL — ?LOVASTATIN 20MG TABLET: 20 | 30 days supply | Qty: 30 | Fill #0

## 2016-09-27 MED FILL — NOVOLIN 70/30 100 UNITS/ML: (70-30) 100 | 20 days supply | Qty: 10 | Fill #0

## 2016-09-27 MED FILL — ?METOPROLOL 50 MG TABLET: 50 | 30 days supply | Qty: 60 | Fill #0

## 2016-09-27 MED FILL — !VENTOLIN HFA INHALER: 108 (90 BAS | 25 days supply | Qty: 18 | Fill #0

## 2016-09-27 MED FILL — OMEPRAZOLE DR 40 MG CAPSULE: 40 | 30 days supply | Qty: 30 | Fill #0

## 2016-09-27 MED FILL — TRUEPLUS SYR 0.5ML 30GX5/16: 30G X 5/16" | 30 days supply | Qty: 100 | Fill #0

## 2016-09-27 MED FILL — PROMETHAZINE 25 MG TABLET: 25 | 30 days supply | Qty: 90 | Fill #0

## 2016-09-27 MED FILL — LOSARTAN POTASSIUM 50 MG TA: 50 | 30 days supply | Qty: 30 | Fill #0

## 2016-09-27 NOTE — Progress Notes (Signed)
Subjective:  Patient ID: Shirley [name], female    DOB: 1975-11-25  Age: 41 y.o. MRN: [id number]  CC: f/u diabetes  HPI Shirley [name] is a 41 y.o. female with multiple comorbidities presents on ED follow up of diabetic gastroparesis. Has been to the ED twice for diabetic gastroparesis since her visit here on 09/05/16. Says she has not picked up prescribed medications ordered at last visit due to transportation issues. Says she will have to start using the bus now. Currently with polydipsia, polyuria, fatigue, tingling, numbness, visual blurring, and nausea. No current abdominal pain or AMS.    Has an abscess on the right inguinal region lateral to the pubic mound. Has already drained and is reduced in size. However, there still remains a moderately sized abscess and some erythema. Denies streaking, induration, or fever/chills.    ROS Review of Systems  Constitutional: Negative for chills, fever and malaise/fatigue.  Eyes: Negative for blurred vision.  Respiratory: Negative for shortness of breath.   Cardiovascular: Negative for chest pain and palpitations.  Gastrointestinal: Positive for abdominal pain and nausea.  Genitourinary: Negative for dysuria and hematuria.  Musculoskeletal: Negative for joint pain and myalgias.  Skin: Negative for rash.       abscess  Neurological: Negative for tingling and headaches.  Psychiatric/Behavioral: Negative for depression. The patient is not nervous/anxious.     Objective:  BP 125/88 (BP Location: Left Arm, Patient Position: Sitting, Cuff Size: Large)   Pulse 100   Temp 99.2 F (37.3 C) (Oral)   Wt 213 lb 9.6 oz (96.9 kg)   LMP 09/09/2016   SpO2 100%   BMI 35.54 kg/m   BP/Weight 09/27/2016 09/26/4648 06/27/4654  Systolic BP 812 751 700  Diastolic BP 88 86 86  Wt. (Lbs) 213.6 210 -  BMI 35.54 34.95 -      Physical Exam  Constitutional: She is oriented to person, place, and time.  Well developed, obese, NAD, polite  HENT:  Head:  Normocephalic and atraumatic.  Eyes: No scleral icterus.  Neck: Normal range of motion. Neck supple. No thyromegaly present.  Cardiovascular: Normal rate, regular rhythm and normal heart sounds.   Pulmonary/Chest: Effort normal and breath sounds normal.  Abdominal: Soft. Bowel sounds are normal. She exhibits no distension. There is tenderness (mild epigastric TTP).  Musculoskeletal: She exhibits no edema.  Neurological: She is alert and oriented to person, place, and time. No cranial nerve deficit. Coordination normal.  Skin: Skin is warm and dry. No rash noted. No erythema. No pallor.  Psychiatric: She has a normal mood and affect. Her behavior is normal. Thought content normal.  Vitals reviewed.    Assessment & Plan:   1. Diabetic gastroparesis (HCC) - Glucose (CBG) - Begin promethazine (PHENERGAN) 25 MG tablet; Take 1 tablet (25 mg total) by mouth every 8 (eight) hours as needed for nausea or vomiting.  Dispense: 90 tablet; Refill: 0 - Begin Erythromycin 200mg /48mL 2.5 mL TID with food if Reglan is ineffective.  - Refill metoCLOPramide (REGLAN) 10 MG tablet; Take 1 tablet (10 mg total) by mouth 4 (four) times daily -  before meals and at bedtime.  Dispense: 120 tablet; Refill: 1 - Glucose (CBG) 375 and 316 in clinic today  2. Type 2 diabetes mellitus with other specified complication, without long-term current use of insulin (HCC) - Glucose (CBG) 375 and 316 in clinic today - Refill insulin NPH-regular Human (NOVOLIN 70/30) (70-30) 100 UNIT/ML injection; 25 units in the morning and 25 units in the  evening.  Dispense: 10 mL; Refill: 11 - Refill insulin aspart (NOVOLOG) 100 UNIT/ML injection; Inject 10 Units into the skin once.  Dispense: 0.1 mL; Refill: 0 - Refill Lancets (FREESTYLE) lancets; Use as instructed  Dispense: 100 each; Refill: 11 - Refill INSULIN SYRINGE .5CC/28G (INS SYRINGE/NEEDLE .5CC/28G) 28G X 1/2" 0.5 ML MISC; Please provide 1 month supply  Dispense: 100 each; Refill:  11 - Glucose (CBG)  3. Acid indigestion - Refill omeprazole (PRILOSEC) 40 MG capsule; Take 1 capsule (40 mg total) by mouth daily.  Dispense: 90 capsule; Refill: 1  4. Essential hypertension - Refill metoprolol tartrate (LOPRESSOR) 50 MG tablet; Take 1 tablet (50 mg total) by mouth 2 (two) times daily.  Dispense: 180 tablet; Refill: 1 - Refill losartan (COZAAR) 50 MG tablet; Take 1 tablet (50 mg total) by mouth daily.  Dispense: 90 tablet; Refill: 1 - Refill lovastatin (MEVACOR) 20 MG tablet; Take 1 tablet (20 mg total) by mouth at bedtime.  Dispense: 90 tablet; Refill: 1 - Refill hydrochlorothiazide (HYDRODIURIL) 25 MG tablet; Take 1 tablet (25 mg total) by mouth daily. Take on tablet in the morning.  Dispense: 90 tablet; Refill: 1 - Refill aspirin EC 81 MG tablet; Take 1 tablet (81 mg total) by mouth daily.  Dispense: 90 tablet; Refill: 3  5. Uncomplicated asthma, unspecified asthma severity, unspecified whether persistent - Refill albuterol (PROVENTIL HFA;VENTOLIN HFA) 108 (90 Base) MCG/ACT inhaler; Inhale 2 puffs into the lungs every 4 (four) hours as needed for wheezing or shortness of breath.  Dispense: 1 Inhaler; Refill: 5   Meds ordered this encounter  Medications  . insulin NPH-regular Human (NOVOLIN 70/30) (70-30) 100 UNIT/ML injection    Sig: 25 units in the morning and 25 units in the evening.    Dispense:  10 mL    Refill:  11    Order Specific Question:   Supervising Provider    Answer:   Tresa Garter [id number]  . erythromycin ethylsuccinate (EES) 200 MG/5ML suspension    Sig: Take 2.5 mLs (100 mg total) by mouth 4 (four) times daily -  with meals and at bedtime.    Dispense:  300 mL    Refill:  0    Order Specific Question:   Supervising Provider    Answer:   Tresa Garter [id number]  . insulin aspart (NOVOLOG) 100 UNIT/ML injection    Sig: Inject 10 Units into the skin once.    Dispense:  0.1 mL    Refill:  0    Order Specific Question:   Supervising  Provider    Answer:   Tresa Garter [id number]  . promethazine (PHENERGAN) 25 MG tablet    Sig: Take 1 tablet (25 mg total) by mouth every 8 (eight) hours as needed for nausea or vomiting.    Dispense:  90 tablet    Refill:  0    Order Specific Question:   Supervising Provider    Answer:   Tresa Garter [id number]  . omeprazole (PRILOSEC) 40 MG capsule    Sig: Take 1 capsule (40 mg total) by mouth daily.    Dispense:  90 capsule    Refill:  1    Order Specific Question:   Supervising Provider    Answer:   Tresa Garter [id number]  . metoprolol tartrate (LOPRESSOR) 50 MG tablet    Sig: Take 1 tablet (50 mg total) by mouth 2 (two) times daily.    Dispense:  180 tablet  Refill:  1    Order Specific Question:   Supervising Provider    Answer:   Tresa Garter [id number]  . metoCLOPramide (REGLAN) 10 MG tablet    Sig: Take 1 tablet (10 mg total) by mouth 4 (four) times daily -  before meals and at bedtime.    Dispense:  120 tablet    Refill:  1    Order Specific Question:   Supervising Provider    Answer:   Tresa Garter [id number]  . losartan (COZAAR) 50 MG tablet    Sig: Take 1 tablet (50 mg total) by mouth daily.    Dispense:  90 tablet    Refill:  1    Order Specific Question:   Supervising Provider    Answer:   Tresa Garter [id number]  . lovastatin (MEVACOR) 20 MG tablet    Sig: Take 1 tablet (20 mg total) by mouth at bedtime.    Dispense:  90 tablet    Refill:  1    Order Specific Question:   Supervising Provider    Answer:   Tresa Garter [id number]  . Lancets (FREESTYLE) lancets    Sig: Use as instructed    Dispense:  100 each    Refill:  11    Substitution allowed    Order Specific Question:   Supervising Provider    Answer:   Tresa Garter [[id number]]  . INSULIN SYRINGE .5CC/28G (INS SYRINGE/NEEDLE .5CC/28G) 28G X 1/2" 0.5 ML MISC    Sig: Please provide 1 month supply    Dispense:  100 each    Refill:  11     Substitution allowed    Order Specific Question:   Supervising Provider    Answer:   Tresa Garter [[id number]]  . hydrochlorothiazide (HYDRODIURIL) 25 MG tablet    Sig: Take 1 tablet (25 mg total) by mouth daily. Take on tablet in the morning.    Dispense:  90 tablet    Refill:  1    Order Specific Question:   Supervising Provider    Answer:   Tresa Garter [id number]  . aspirin EC 81 MG tablet    Sig: Take 1 tablet (81 mg total) by mouth daily.    Dispense:  90 tablet    Refill:  3    Order Specific Question:   Supervising Provider    Answer:   Tresa Garter [id number]  . albuterol (PROVENTIL HFA;VENTOLIN HFA) 108 (90 Base) MCG/ACT inhaler    Sig: Inhale 2 puffs into the lungs every 4 (four) hours as needed for wheezing or shortness of breath.    Dispense:  1 Inhaler    Refill:  5    Order Specific Question:   Supervising Provider    Answer:   Tresa Garter [id number]    Follow-up: Return in about 4 weeks (around 10/25/2016) for DM.   Clent Demark PA

## 2016-09-27 NOTE — Patient Instructions (Addendum)
Use metoclopramide for gastroparesis. If ineffective use Erythromycin. Suspend Lovastatin when taking Erythromycin.  Gastroparesis Gastroparesis, also called delayed gastric emptying, is a condition in which food takes longer than normal to empty from the stomach. The condition is usually long-lasting (chronic). What are the causes? This condition may be caused by:  An endocrine disorder, such as hypothyroidism or diabetes. Diabetes is the most common cause of this condition.  A nervous system disease, such as Parkinson disease or multiple sclerosis.  Cancer, infection, or surgery of the stomach or vagus nerve.  A connective tissue disorder, such as scleroderma.  Certain medicines.  In most cases, the cause is not known. What increases the risk? This condition is more likely to develop in:  People with certain disorders, including endocrine disorders, eating disorders, amyloidosis, and scleroderma.  People with certain diseases, including Parkinson disease or multiple sclerosis.  People with cancer or infection of the stomach or vagus nerve.  People who have had surgery on the stomach or vagus nerve.  People who take certain medicines.  Women.  What are the signs or symptoms? Symptoms of this condition include:  An early feeling of fullness when eating.  Nausea.  Weight loss.  Vomiting.  Heartburn.  Abdominal bloating.  Inconsistent blood glucose levels.  Lack of appetite.  Acid from the stomach coming up into the esophagus (gastroesophageal reflux).  Spasms of the stomach.  Symptoms may come and go. How is this diagnosed? This condition is diagnosed with tests, such as:  Tests that check how long it takes food to move through the stomach and intestines. These tests include: ? Upper gastrointestinal (GI) series. In this test, X-rays of the intestines are taken after you drink a liquid. The liquid makes the intestines show up better on the  X-rays. ? Gastric emptying scintigraphy. In this test, scans are taken after you eat food that contains a small amount of radioactive material. ? Wireless capsule GI monitoring system. This test involves swallowing a capsule that records information about movement through the stomach.  Gastric manometry. This test measures electrical and muscular activity in the stomach. It is done with a thin tube that is passed down the throat and into the stomach.  Endoscopy. This test checks for abnormalities in the lining of the stomach. It is done with a long, thin tube that is passed down the throat and into the stomach.  An ultrasound. This test can help rule out gallbladder disease or pancreatitis as a cause of your symptoms. It uses sound waves to take pictures of the inside of your body.  How is this treated? There is no cure for gastroparesis. This condition may be managed with:  Treatment of the underlying condition causing the gastroparesis.  Lifestyle changes, including exercise and dietary changes. Dietary changes can include: ? Changes in what and when you eat. ? Eating smaller meals more often. ? Eating low-fat foods. ? Eating low-fiber forms of high-fiber foods, such as cooked vegetables instead of raw vegetables. ? Having liquid foods in place of solid foods. Liquid foods are easier to digest.  Medicines. These may be given to control nausea and vomiting and to stimulate stomach muscles.  Getting food through a feeding tube. This may be done in severe cases.  A gastric neurostimulator. This is a device that is inserted into the body with surgery. It helps improve stomach emptying and control nausea and vomiting.  Follow these instructions at home:  Follow your health care provider's instructions about exercise and  diet.  Take medicines only as directed by your health care provider. Contact a health care provider if:  Your symptoms do not improve with treatment.  You have new  symptoms. Get help right away if:  You have severe abdominal pain that does not improve with treatment.  You have nausea that does not go away.  You cannot keep fluids down. This information is not intended to replace advice given to you by your health care provider. Make sure you discuss any questions you have with your health care provider. Document Released: 04/10/2005 Document Revised: 09/16/2015 Document Reviewed: 04/06/2014 Elsevier Interactive Patient Education  [employer].

## 2016-09-28 LAB — URINE CULTURE: Culture: >=100000 — AB

## 2016-09-29 ENCOUNTER — Telehealth: Payer: Self-pay

## 2016-09-29 NOTE — Telephone Encounter (Signed)
Post ED Visit - Positive Culture Follow-up: Unsuccessful Patient Follow-up  Culture assessed and recommendations reviewed by:  []  Elenor Quinones, Pharm.D. []  Heide Guile, Pharm.D., BCPS AQ-ID []  Parks Neptune, Pharm.D., BCPS []  Alycia Rossetti, Pharm.D., BCPS []  [place], Pharm.D., BCPS, AAHIVP []  Legrand Como, Pharm.D., BCPS, AAHIVP []  Salome Arnt, PharmD, BCPS []  Dimitri Ped, PharmD, BCPS [x]  Vincenza Hews, PharmD, BCPS  Positive urine culture  [x]  Patient discharged without antimicrobial prescription and treatment is now indicated []  Organism is resistant to prescribed ED discharge antimicrobial []  Patient with positive blood cultures   Unable to contact patient after 3 attempts, letter will be sent to address on file  Genia Del 09/29/2016, 10:12 AM

## 2016-09-29 NOTE — Progress Notes (Signed)
ED Antimicrobial Stewardship Positive Culture Follow Up   Shirley [name] is an 41 y.o. female who presented to [facility] on 09/25/2016 with a chief complaint of   Chief Complaint  Patient presents with  . Abdominal Pain    Recent Results (from the past 720 hour(s))  Culture, blood (Routine X 2) w Reflex to ID Panel     Status: None   Collection Time: 09/09/16 10:14 AM  Result Value Ref Range Status   Specimen Description BLOOD LEFT ARM  Final   Special Requests IN PEDIATRIC BOTTLE Blood Culture adequate volume  Final   Culture NO GROWTH 5 DAYS  Final   Report Status 09/14/2016 FINAL  Final  Culture, blood (Routine X 2) w Reflex to ID Panel     Status: None   Collection Time: 09/09/16 10:15 AM  Result Value Ref Range Status   Specimen Description BLOOD RIGHT ARM  Final   Special Requests IN PEDIATRIC BOTTLE Blood Culture adequate volume  Final   Culture NO GROWTH 5 DAYS  Final   Report Status 09/14/2016 FINAL  Final  Urine culture     Status: Abnormal   Collection Time: 09/25/16  3:08 PM  Result Value Ref Range Status   Specimen Description URINE, RANDOM  Final   Special Requests NONE  Final   Culture >=100,000 COLONIES/mL ESCHERICHIA COLI (A)  Final   Report Status 09/28/2016 FINAL  Final   Organism ID, Bacteria ESCHERICHIA COLI (A)  Final      Susceptibility   Escherichia coli - MIC*    AMPICILLIN <=2 SENSITIVE Sensitive     CEFAZOLIN <=4 SENSITIVE Sensitive     CEFTRIAXONE <=1 SENSITIVE Sensitive     CIPROFLOXACIN <=0.25 SENSITIVE Sensitive     GENTAMICIN <=1 SENSITIVE Sensitive     IMIPENEM <=0.25 SENSITIVE Sensitive     NITROFURANTOIN <=16 SENSITIVE Sensitive     TRIMETH/SULFA <=20 SENSITIVE Sensitive     AMPICILLIN/SULBACTAM <=2 SENSITIVE Sensitive     PIP/TAZO <=4 SENSITIVE Sensitive     Extended ESBL NEGATIVE Sensitive     * >=100,000 COLONIES/mL ESCHERICHIA COLI    [x]  Patient discharged originally without antimicrobial agent and treatment is now indicated    New antibiotic prescription: Cephalexin (Kelfex) 500 mg every 8 hours for 7 days   ED Provider: Shary Decamp PA-C   Duayne Cal 09/29/2016, 9:33 AM Infectious Diseases Pharmacist Phone# [phone]

## 2016-10-14 IMAGING — CR DG CHEST 2V
2 series · 2 of 2 positions shown · non-contrast
Comparison: 11/25/2013

CLINICAL DATA: Left-sided chest pain for 2 days. Weakness since
this evening.

EXAM:
CHEST  2 VIEW

[w chest pa]
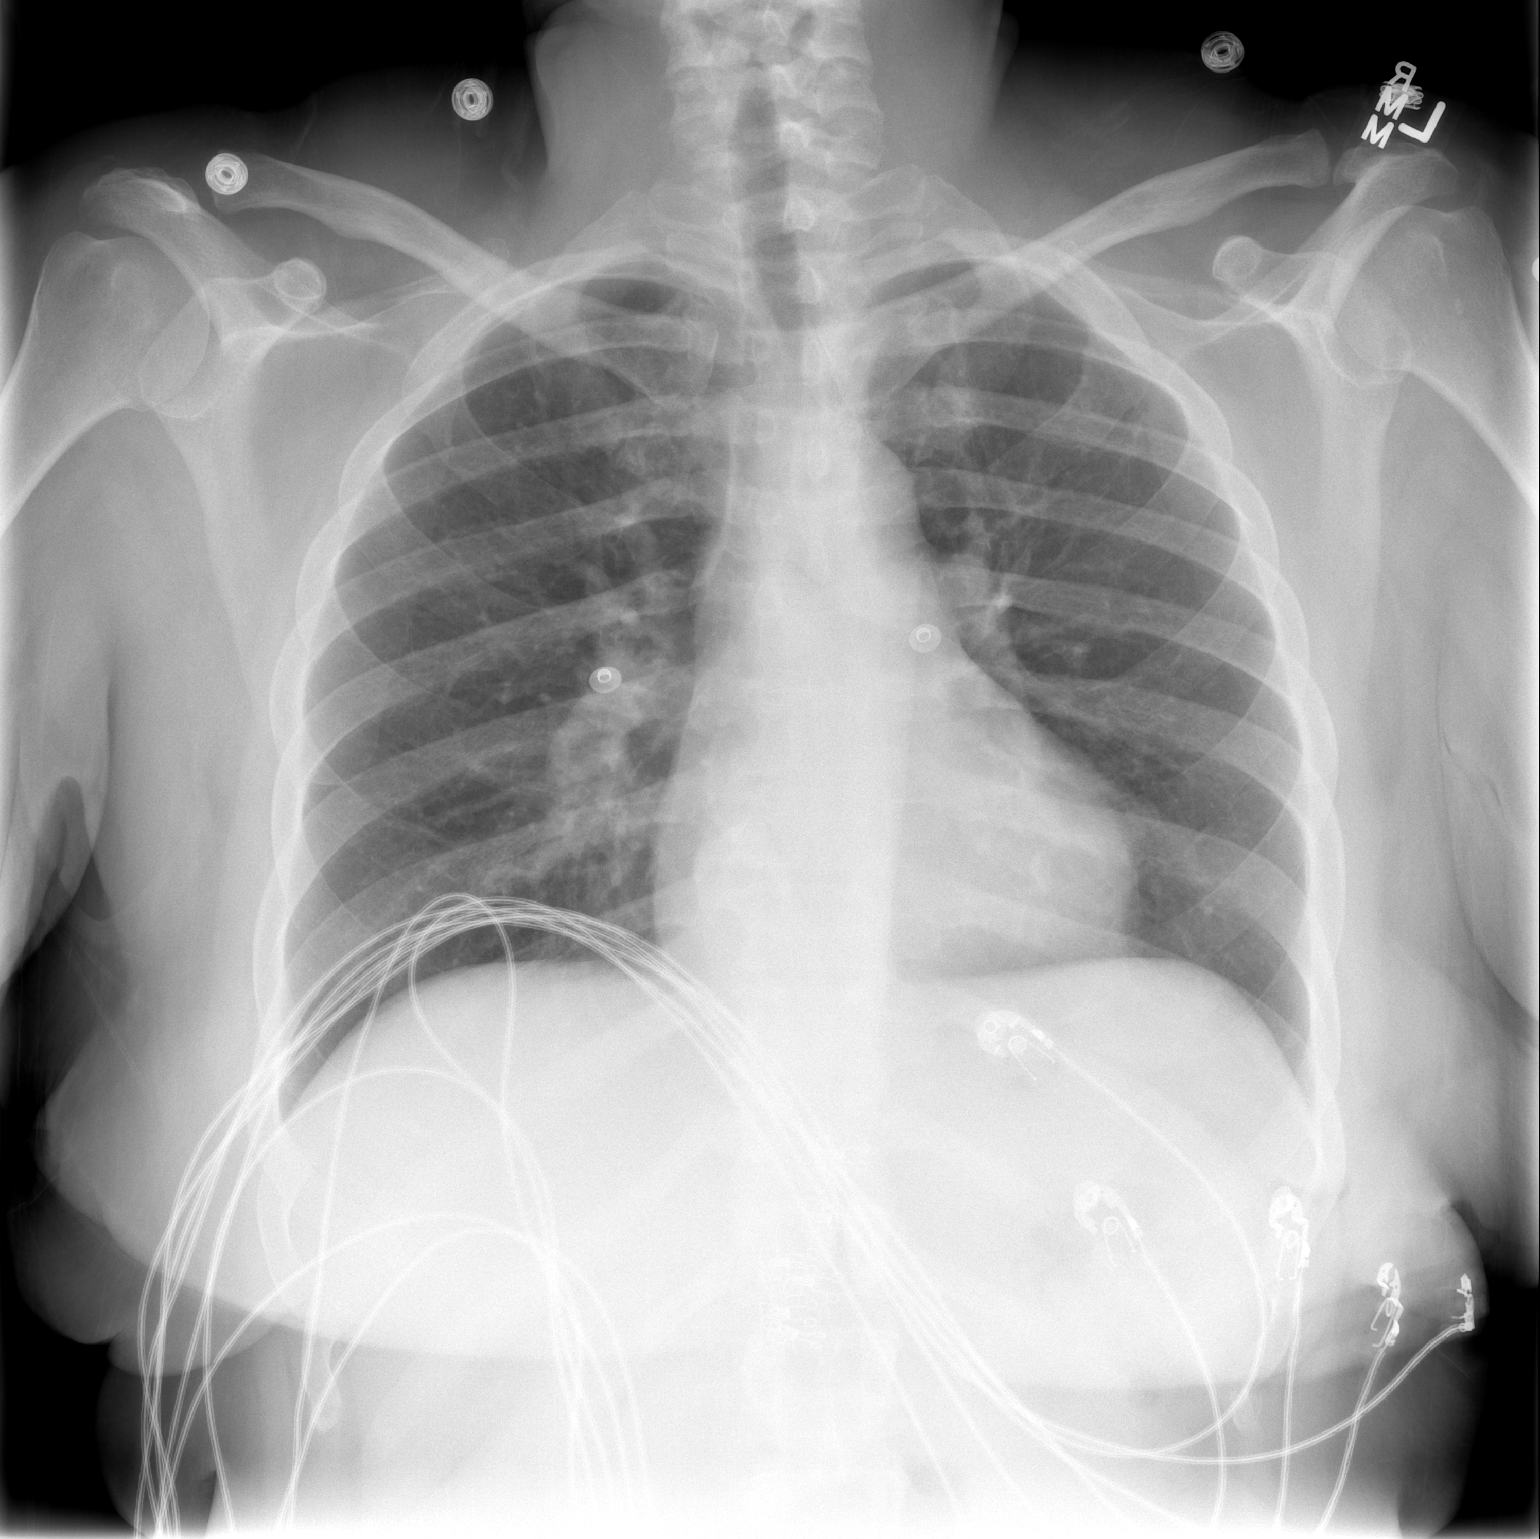

[w chest lat]
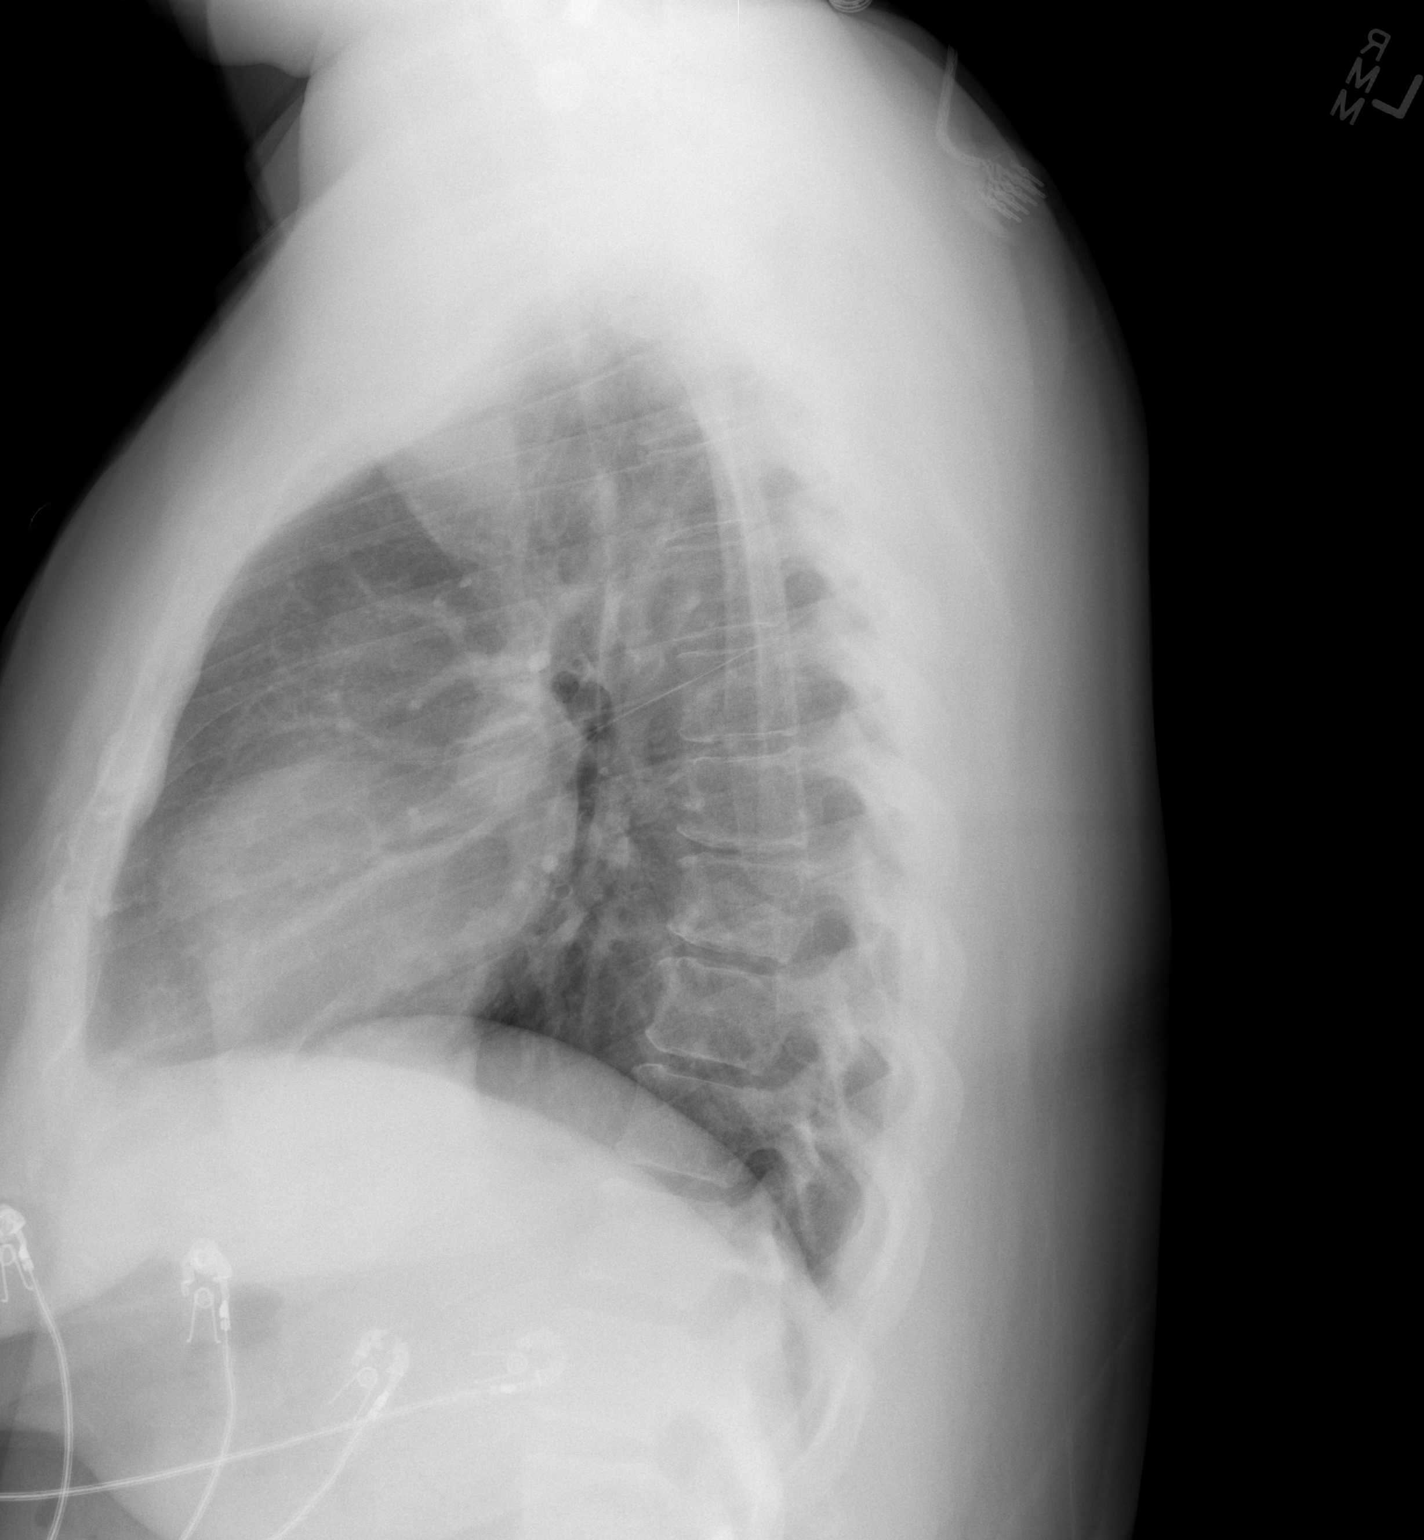

[2 of 2 positions shown; findings below may reference images not displayed]

FINDINGS: The heart size and mediastinal contours are within normal limits.
Both lungs are clear. The visualized skeletal structures are
unremarkable.
IMPRESSION: No active cardiopulmonary disease.

## 2016-10-16 ENCOUNTER — Telehealth (INDEPENDENT_AMBULATORY_CARE_PROVIDER_SITE_OTHER): Payer: Self-pay | Admitting: Physician Assistant

## 2016-10-16 NOTE — Telephone Encounter (Signed)
Patient called stated vomiting and having pain like she is in labor. Thinks it might be side effects of Depo shot.  Declined appt at this time. Stated wants dr. To call her back.  Please follow up with patient

## 2016-10-16 NOTE — Telephone Encounter (Signed)
I can not evaluate such concerning symptoms over the phone. I called patient and she told me that she is having severe lower abdominal pain. I have advised her to go to the ED. Patient agreed and will be going to the ED.

## 2016-10-16 NOTE — Telephone Encounter (Signed)
PCP will not triage via phone, patient will need an appt. Will send to provider for review. Nat Christen, CMA

## 2016-10-18 ENCOUNTER — Encounter (HOSPITAL_COMMUNITY): Payer: Self-pay | Admitting: Emergency Medicine

## 2016-10-18 ENCOUNTER — Emergency Department (HOSPITAL_COMMUNITY)
Admission: EM | Admit: 2016-10-18 | Discharge: 2016-10-18 | Disposition: A | Discharge status: Home / Self Care | Payer: Medicaid Other | Attending: Emergency Medicine | Admitting: Emergency Medicine

## 2016-10-18 DIAGNOSIS — I2510 Atherosclerotic heart disease of native coronary artery without angina pectoris: Secondary | ICD-10-CM | POA: Insufficient documentation

## 2016-10-18 DIAGNOSIS — E1143 Type 2 diabetes mellitus with diabetic autonomic (poly)neuropathy: Secondary | ICD-10-CM

## 2016-10-18 DIAGNOSIS — Z87891 Personal history of nicotine dependence: Secondary | ICD-10-CM | POA: Insufficient documentation

## 2016-10-18 DIAGNOSIS — Z8673 Personal history of transient ischemic attack (TIA), and cerebral infarction without residual deficits: Secondary | ICD-10-CM | POA: Insufficient documentation

## 2016-10-18 DIAGNOSIS — Z79899 Other long term (current) drug therapy: Secondary | ICD-10-CM | POA: Insufficient documentation

## 2016-10-18 DIAGNOSIS — Z7982 Long term (current) use of aspirin: Secondary | ICD-10-CM | POA: Insufficient documentation

## 2016-10-18 DIAGNOSIS — K3184 Gastroparesis: Secondary | ICD-10-CM

## 2016-10-18 DIAGNOSIS — Z794 Long term (current) use of insulin: Secondary | ICD-10-CM | POA: Insufficient documentation

## 2016-10-18 LAB — CBC WITH DIFFERENTIAL/PLATELET
Basophils Absolute: 0 10*3/uL (ref 0.0–0.1)
Basophils Relative: 0 %
Eosinophils Absolute: 0.4 10*3/uL (ref 0.0–0.7)
Eosinophils Relative: 3 %
HCT: 27.7 % — ABNORMAL LOW (ref 36.0–46.0)
Hemoglobin: 9.2 g/dL — ABNORMAL LOW (ref 12.0–15.0)
Lymphocytes Relative: 20 %
Lymphs Abs: 2.6 10*3/uL (ref 0.7–4.0)
MCH: 22.5 pg — ABNORMAL LOW (ref 26.0–34.0)
MCHC: 33.2 g/dL (ref 30.0–36.0)
MCV: 67.9 fL — ABNORMAL LOW (ref 78.0–100.0)
Monocytes Absolute: 0.7 10*3/uL (ref 0.1–1.0)
Monocytes Relative: 5 %
Neutro Abs: 9.4 10*3/uL — ABNORMAL HIGH (ref 1.7–7.7)
Neutrophils Relative %: 72 %
Platelets: 286 10*3/uL (ref 150–400)
RBC: 4.08 MIL/uL (ref 3.87–5.11)
RDW: 21.3 % — ABNORMAL HIGH (ref 11.5–15.5)
WBC: 13.1 10*3/uL — ABNORMAL HIGH (ref 4.0–10.5)

## 2016-10-18 LAB — URINALYSIS, ROUTINE W REFLEX MICROSCOPIC
Bilirubin Urine: NEGATIVE
Glucose, UA: >=500 mg/dL — AB
Ketones, ur: NEGATIVE mg/dL
Nitrite: POSITIVE — AB
Protein, ur: NEGATIVE mg/dL
Specific Gravity, Urine: 1.021 (ref 1.005–1.030)
pH: 6 (ref 5.0–8.0)

## 2016-10-18 LAB — COMPREHENSIVE METABOLIC PANEL
ALT: 8 U/L — ABNORMAL LOW (ref 14–54)
AST: 12 U/L — ABNORMAL LOW (ref 15–41)
Albumin: 2.7 g/dL — ABNORMAL LOW (ref 3.5–5.0)
Alkaline Phosphatase: 45 U/L (ref 38–126)
Anion gap: 6 (ref 5–15)
BUN: 17 mg/dL (ref 6–20)
CO2: 22 mmol/L (ref 22–32)
Calcium: 8.7 mg/dL — ABNORMAL LOW (ref 8.9–10.3)
Chloride: 107 mmol/L (ref 101–111)
Creatinine, Ser: 1.11 mg/dL — ABNORMAL HIGH (ref 0.44–1.00)
GFR calc Af Amer: >60 mL/min (ref >60)
GFR calc non Af Amer: 60 mL/min — ABNORMAL LOW (ref >60)
Glucose, Bld: 329 mg/dL — ABNORMAL HIGH (ref 65–99)
Potassium: 4 mmol/L (ref 3.5–5.1)
Sodium: 135 mmol/L (ref 135–145)
Total Bilirubin: 0.4 mg/dL (ref 0.3–1.2)
Total Protein: 6.2 g/dL — ABNORMAL LOW (ref 6.5–8.1)

## 2016-10-18 LAB — CBG MONITORING, ED: Glucose-Capillary: 234 mg/dL — ABNORMAL HIGH (ref 65–99)

## 2016-10-18 LAB — LIPASE, BLOOD: Lipase: 31 U/L (ref 11–51)

## 2016-10-18 LAB — PREGNANCY, URINE: Preg Test, Ur: NEGATIVE

## 2016-10-18 MED ORDER — FENTANYL CITRATE (PF) 100 MCG/2ML IJ SOLN
100.0000 ug | Freq: Once | INTRAMUSCULAR | Status: AC
  Administered 2016-10-18: 100 ug via INTRAVENOUS
  Filled 2016-10-18: qty 2

## 2016-10-18 MED ORDER — CEPHALEXIN 500 MG PO CAPS: 1000.0000 mg | ORAL_CAPSULE | Freq: Two times a day (BID) | ORAL | 0 refills | Status: DC

## 2016-10-18 MED ORDER — SODIUM CHLORIDE 0.9 % IV BOLUS (SEPSIS)
1000.0000 mL | Freq: Once | INTRAVENOUS | Status: AC
  Administered 2016-10-18: 1000 mL via INTRAVENOUS

## 2016-10-18 MED ORDER — PROMETHAZINE HCL 25 MG/ML IJ SOLN
25.0000 mg | Freq: Once | INTRAMUSCULAR | Status: AC
  Administered 2016-10-18: 25 mg via INTRAVENOUS
  Filled 2016-10-18: qty 1

## 2016-10-18 MED ORDER — HALOPERIDOL LACTATE 5 MG/ML IJ SOLN
5.0000 mg | Freq: Once | INTRAMUSCULAR | Status: AC
Start: 2016-10-18 — End: 2016-10-18
  Administered 2016-10-18: 5 mg via INTRAVENOUS
  Filled 2016-10-18: qty 1

## 2016-10-18 MED ORDER — SODIUM CHLORIDE 0.9 % IV BOLUS (SEPSIS): 1000.0000 mL | Freq: Once | INTRAVENOUS | Status: DC

## 2016-10-18 MED ORDER — DEXTROSE 5 % IV SOLN
1.0000 g | Freq: Once | INTRAVENOUS | Status: AC
  Administered 2016-10-18: 1 g via INTRAVENOUS
  Filled 2016-10-18: qty 10

## 2016-10-18 MED ORDER — PROMETHAZINE HCL 25 MG PO TABS: 25.0000 mg | ORAL_TABLET | Freq: Three times a day (TID) | ORAL | 0 refills | Status: DC | PRN

## 2016-10-18 MED ORDER — SODIUM CHLORIDE 0.9 % IV BOLUS (SEPSIS)
1000.0000 mL | Freq: Once | INTRAVENOUS | Status: AC
Start: 2016-10-18 — End: 2016-10-18
  Administered 2016-10-18: 1000 mL via INTRAVENOUS

## 2016-10-18 MED ORDER — TRAMADOL HCL 50 MG PO TABS: 50.0000 mg | ORAL_TABLET | Freq: Four times a day (QID) | ORAL | 0 refills | Status: DC | PRN

## 2016-10-18 NOTE — ED Notes (Signed)
Irena Cords PA made aware pt states she can't urinate at this time doesn't want to have in and out cathter. Verbal order to give 1L of ns and attempt to collect urine sample at that time. Pt still resting in bed with eyes closed.

## 2016-10-18 NOTE — ED Notes (Signed)
Trude Mcburney PA made aware of pt loss of IV. PA reports pt will be DC with home antibiotics to not give IV.

## 2016-10-18 NOTE — ED Notes (Signed)
Pt's CBG result was 234. Informed Jamie - RN.

## 2016-10-18 NOTE — ED Notes (Signed)
Pt is resting comfortably at this time, resting with eyes closed.

## 2016-10-18 NOTE — ED Triage Notes (Signed)
Pt has history of recurrent vomiting gastroparesis and has been having recurrent symptoms for there last week with 10/10 LLQ pain has been using oral phergan at home with no relief. Pt was given 4mg  OTD zofran PTA.

## 2016-10-18 NOTE — ED Provider Notes (Signed)
[facility] DEPT Provider Note   CSN: [id number] Arrival date & time: 10/18/16  0803     History   Chief Complaint Chief Complaint  Patient presents with  . Abdominal Pain  . Emesis    HPI Shirley [name] is a 41 y.o. female.  HPI Patient presents to the emergency department with a flare of her gastroparesis.  Patient states that she has been having some worsening symptoms over the last two-week she states she was seen in the emergency department about a month ago with similar symptoms.  She states that seems make the condition better or worse.  She states that her home medications were not helping.  Patient states she was unable to keep any fluids or food on her stomach. The patient denies chest pain, shortness of breath, headache,blurred vision, neck pain, fever, cough, weakness, numbness, dizziness, anorexia, edema,diarrhea, rash, back pain, dysuria, hematemesis, bloody stool, near syncope, or syncope. Past Medical History:  Diagnosis Date  . Abscess of tunica vaginalis    10/09- Abundant S. aureus- sensitive to all abx  . Anxiety   . Blood dyscrasia   . CAD (coronary artery disease) 06/15/2006   s/p Subendocardial MI with PDA angioplasty(no stent) on 06/15/06 and relook  cath 06/19/06 showed patency of site. Cath 12/10- no restenosis or significant CAD progression  . CVA (cerebral vascular accident) ([place]) ~ 02/2014   denies residual on 04/22/2014  . CVA (cerebral vascular accident) [facility])    history of remote right cerebellar infarct noted on head CT at least since 10/2011  . Depression   . Diabetes mellitus type 2, uncontrolled, with complications ([place])   . Fibromyalgia   . Gastritis   . Gastroparesis    secondary to poorly controlled DM, last emptying study performed 01/2010  was normal but may be falsely positive as pt was on reglan  . GERD (gastroesophageal reflux disease)   . Hepatitis B, chronic (HCC)    Hep BeAb+,Hep B cAb+ & Hep BsAg+ (9/06)  . History of  pyelonephritis    H/o GrpB Pyelonephritis (9/06) and UTI- 07/11- E.Coli, 12/10- GBS  . Hyperlipidemia   . Hypertension   . Iron deficiency anemia   . Irregular menses    Small ovarian follicles seen on VE(7/20)  . MI (myocardial infarction) ([place]) 05/2006   PDA percutaneous transluminal coronary angioplasty  . Migraine    "weekly" (04/22/2014)  . N&V (nausea and vomiting)    Chronic. Unclear etiology with multiple admission and ED visits. CT abdomen with and without contrast (02/2011)  showed no acute process. Gastic Emptying scan (01/2010) was normal. Ultrasound of the abdomen was within normal limits. Hepatitis B viral load was undectable. HIV NR. EGD - gastritis, Hpylori + s/p Rx  . Obesity   . OSA (obstructive sleep apnea)    "suppose to wear mask but I don't" (04/22/2014)  . Peripheral neuropathy   . Pneumonia    "this is probably the 2nd or 3rd time I've had pneumonia" (04/22/2014)  . Recurrent boils   . Seasonal asthma   . Substance abuse   . Thrombocytosis ([place])    Hem/Onc suggested 2/2 chronic hepatits and/or iron deficiency anemia    Patient Active Problem List   Diagnosis Date Noted  . Nausea and vomiting 09/23/2016  . Intractable nausea and vomiting 08/11/2016  . Type 2 diabetes mellitus with hyperglycemia ([place]) 04/07/2016  . Nausea & vomiting 04/07/2016  . Diabetic gastroparesis ([place]) 04/06/2016  . Non-intractable cyclical vomiting with nausea   .  Dehydration 03/27/2015  . Intractable vomiting with nausea 11/11/2014  . Gastroparesis 11/11/2014  . Cough   . Hematemesis 10/13/2014  . DKA (diabetic ketoacidoses) ([place]) 10/13/2014  . Increased anion gap metabolic acidosis 17/71/1657  . Tachycardia 07/21/2014  . Abdominal pain, chronic, left lower quadrant   . Nausea with vomiting   . Diabetic gastroparesis associated with type 2 diabetes mellitus ([place]) 04/28/2014  . History of Helicobacter pylori infection 04/22/2014  . Vaginal discharge 02/18/2014  . Atypical  chest pain 07/22/2013  . Unspecified constipation 07/21/2013  . Tinea corporis 07/21/2013  . Intractable vomiting 04/29/2013  . Dysmenorrhea 04/22/2013  . UTI (urinary tract infection) 07/15/2012  . Headache(784.0) 02/08/2012  . Health care maintenance 01/22/2012  . Chronic hepatitis B ([place]) 03/07/2011  . History of leukocytosis 04/06/2010  . THROMBOCYTOSIS 04/06/2010  . Polysubstance abuse 02/23/2010  . Iron deficiency anemia 11/22/2009  . PERIPHERAL NEUROPATHY 10/01/2009  . Hyperlipidemia 08/30/2009  . Diabetic polyneuropathy ([place]) 08/30/2009  . Hidradenitis (recurrent boils) 07/07/2008  . Depression 12/27/2007  . Abdominal pain, left lower quadrant 11/21/2007  . FIBROMYALGIA 10/30/2007  . BACK PAIN 04/01/2007  . OBSTRUCTIVE SLEEP APNEA 01/17/2007  . ANXIETY DEPRESSION 06/27/2006  . Chronic ischemic heart disease 06/15/2006  . OBESITY, MORBID 05/15/2006  . MIGRAINE HEADACHE 05/15/2006  . Asthma 05/15/2006  . Essential hypertension 01/16/2006  . IRREGULAR MENSTRUATION 01/16/2006  . PEDAL EDEMA 01/16/2006  . Poorly controlled type 2 diabetes mellitus with peripheral neuropathy ([place]) 01/16/1989    Past Surgical History:  Procedure Laterality Date  . CESAREAN SECTION  1997  . CORONARY ANGIOPLASTY WITH STENT PLACEMENT  2008   "2 stents"  . ESOPHAGOGASTRODUODENOSCOPY N/A 04/23/2014   Procedure: ESOPHAGOGASTRODUODENOSCOPY (EGD);  Surgeon: Winfield Cunas., MD;  Location: [facility] ENDOSCOPY;  Service: Endoscopy;  Laterality: N/A;    OB History    No data available       Home Medications    Prior to Admission medications   Medication Sig Start Date End Date Taking? Authorizing Provider  acetaminophen (TYLENOL) 500 MG tablet Take 500 mg by mouth every 6 (six) hours as needed.    [provider]  albuterol (PROVENTIL HFA;VENTOLIN HFA) 108 (90 Base) MCG/ACT inhaler Inhale 2 puffs into the lungs every 4 (four) hours as needed for wheezing or shortness of breath. 09/27/16    Clent Demark, PA-C  aspirin EC 81 MG tablet Take 1 tablet (81 mg total) by mouth daily. 09/27/16   Clent Demark, PA-C  clindamycin (CLINDAGEL) 1 % gel Apply topically 2 (two) times daily. 09/27/16   Clent Demark, PA-C  cloNIDine (CATAPRES) 0.1 MG tablet Take 1 tablet (0.1 mg total) by mouth daily as needed. Take 1 tablet if bp above 903 systolic 8/33/38 06/23/89  Nita Sells, MD  erythromycin ethylsuccinate (EES) 200 MG/5ML suspension Take 2.5 mLs (100 mg total) by mouth 4 (four) times daily -  with meals and at bedtime. 09/27/16 10/27/16  Clent Demark, PA-C  ferrous sulfate 325 (65 FE) MG tablet Take 1 tablet (325 mg total) by mouth 2 (two) times daily with a meal. 09/05/16   Clent Demark, PA-C  glucose blood (FREESTYLE TEST STRIPS) test strip Use as instructed 09/05/16   Clent Demark, PA-C  glucose monitoring kit (FREESTYLE) monitoring kit 1 each by Does not apply route 4 (four) times daily - after meals and at bedtime. 1 month Diabetic Testing Supplies for QAC-QHS accuchecks. 09/05/16   Clent Demark, PA-C  hydrochlorothiazide (HYDRODIURIL)  25 MG tablet Take 1 tablet (25 mg total) by mouth daily. Take on tablet in the morning. 09/27/16   Clent Demark, PA-C  insulin aspart (NOVOLOG) 100 UNIT/ML injection Inject 10 Units into the skin once. 09/27/16 09/27/16  Clent Demark, PA-C  insulin NPH-regular Human (NOVOLIN 70/30) (70-30) 100 UNIT/ML injection 25 units in the morning and 25 units in the evening. 09/27/16   Clent Demark, PA-C  INSULIN SYRINGE .5CC/28G (INS SYRINGE/NEEDLE .5CC/28G) 28G X 1/2" 0.5 ML MISC Please provide 1 month supply 09/27/16   Clent Demark, PA-C  Lancets (FREESTYLE) lancets Use as instructed 09/27/16   Clent Demark, PA-C  losartan (COZAAR) 50 MG tablet Take 1 tablet (50 mg total) by mouth daily. 09/27/16   Clent Demark, PA-C  lovastatin (MEVACOR) 20 MG tablet Take 1 tablet (20 mg total) by mouth at bedtime. 09/27/16    Clent Demark, PA-C  metoCLOPramide (REGLAN) 10 MG tablet Take 1 tablet (10 mg total) by mouth 4 (four) times daily -  before meals and at bedtime. 09/27/16   Clent Demark, PA-C  metoprolol tartrate (LOPRESSOR) 50 MG tablet Take 1 tablet (50 mg total) by mouth 2 (two) times daily. 09/27/16   Clent Demark, PA-C  omeprazole (PRILOSEC) 40 MG capsule Take 1 capsule (40 mg total) by mouth daily. 09/27/16   Clent Demark, PA-C  promethazine (PHENERGAN) 25 MG tablet Take 1 tablet (25 mg total) by mouth every 8 (eight) hours as needed for nausea or vomiting. 09/27/16   Clent Demark, PA-C    Family History Family History  Problem Relation Age of Onset  . Diabetes Father     Social History Social History  Substance Use Topics  . Smoking status: Former Smoker    Types: Cigarettes    Quit date: 04/24/1996  . Smokeless tobacco: Never Used     Comment: quit smoking cigarettes age 84  . Alcohol use 0.0 oz/week     Comment: 04/22/2014 "might have a few drinks a month"     Allergies   Lisinopril and Morphine and related   Review of Systems Review of Systems  All other systems negative except as documented in the HPI. All pertinent positives and negatives as reviewed in the HPI.  Physical Exam Updated Vital Signs BP 127/80   Pulse 100   Temp 98.1 F (36.7 C) (Oral)   Resp 13   Ht 5' 5"  (1.651 m)   Wt 96.6 kg (213 lb)   LMP 09/13/2016   SpO2 100%   BMI 35.45 kg/m   Physical Exam  Constitutional: She is oriented to person, place, and time. She appears well-developed and well-nourished. No distress.  HENT:  Head: Normocephalic and atraumatic.  Mouth/Throat: Oropharynx is clear and moist.  Eyes: Pupils are equal, round, and reactive to light.  Neck: Normal range of motion. Neck supple.  Cardiovascular: Normal rate, regular rhythm and normal heart sounds.  Exam reveals no gallop and no friction rub.   No murmur heard. Pulmonary/Chest: Effort normal and breath  sounds normal. No respiratory distress. She has no wheezes.  Abdominal: Soft. Bowel sounds are normal. She exhibits no distension and no mass. There is tenderness. There is no guarding.  Neurological: She is alert and oriented to person, place, and time. She exhibits normal muscle tone. Coordination normal.  Skin: Skin is warm and dry. Capillary refill takes less than 2 seconds. No rash noted. No erythema.  Psychiatric: She has a normal  mood and affect. Her behavior is normal.  Nursing note and vitals reviewed.    ED Treatments / Results  Labs (all labs ordered are listed, but only abnormal results are displayed) Labs Reviewed  COMPREHENSIVE METABOLIC PANEL - Abnormal; Notable for the following:       Result Value   Glucose, Bld 329 (*)    Creatinine, Ser 1.11 (*)    Calcium 8.7 (*)    Total Protein 6.2 (*)    Albumin 2.7 (*)    AST 12 (*)    ALT 8 (*)    GFR calc non Af Amer 60 (*)    All other components within normal limits  CBC WITH DIFFERENTIAL/PLATELET - Abnormal; Notable for the following:    WBC 13.1 (*)    Hemoglobin 9.2 (*)    HCT 27.7 (*)    MCV 67.9 (*)    MCH 22.5 (*)    RDW 21.3 (*)    Neutro Abs 9.4 (*)    All other components within normal limits  URINALYSIS, ROUTINE W REFLEX MICROSCOPIC - Abnormal; Notable for the following:    APPearance HAZY (*)    Glucose, UA >=500 (*)    Hgb urine dipstick SMALL (*)    Nitrite POSITIVE (*)    Leukocytes, UA LARGE (*)    Bacteria, UA RARE (*)    Squamous Epithelial / LPF 0-5 (*)    All other components within normal limits  CBG MONITORING, ED - Abnormal; Notable for the following:    Glucose-Capillary 234 (*)    All other components within normal limits  LIPASE, BLOOD  PREGNANCY, URINE    EKG  EKG Interpretation None       Radiology No results found.  Procedures Procedures (including critical care time)  Medications Ordered in ED Medications  cefTRIAXone (ROCEPHIN) 1 g in dextrose 5 % 50 mL IVPB (1 g  Intravenous New Bag/Given 10/18/16 1333)  sodium chloride 0.9 % bolus 1,000 mL (0 mLs Intravenous Stopped 10/18/16 1058)  promethazine (PHENERGAN) injection 25 mg (25 mg Intravenous Given 10/18/16 0847)  fentaNYL (SUBLIMAZE) injection 100 mcg (100 mcg Intravenous Given 10/18/16 0847)  haloperidol lactate (HALDOL) injection 5 mg (5 mg Intravenous Given 10/18/16 0852)  sodium chloride 0.9 % bolus 1,000 mL (0 mLs Intravenous Stopped 10/18/16 1249)     Initial Impression / Assessment and Plan / ED Course  I have reviewed the triage vital signs and the nursing notes.  Pertinent labs & imaging results that were available during my care of the patient were reviewed by me and considered in my medical decision making (see chart for details).    Patient has been resting comfortably here in the ER. Was given Haldol antiemetics and fluids.  Patient is feeling better and will be discharged home.  Patient is advised to follow-up with her primary care Dr. told to return here as needed  Final Clinical Impressions(s) / ED Diagnoses   Final diagnoses:  None    New Prescriptions New Prescriptions   No medications on file     Dalia Heading, PA-C 10/20/16 [place], [place], MD 10/20/16 1343

## 2016-10-18 NOTE — ED Notes (Signed)
Pt unable to urinate at this time. Will reassess at this time.

## 2016-10-18 NOTE — ED Notes (Signed)
Pt phone ringing, offered to answer for her, she states she does not want to speak to anyone right now.

## 2016-10-18 NOTE — Discharge Instructions (Signed)
Follow-up with your primary doctor and GI specialist.  Return here as needed

## 2016-10-24 ENCOUNTER — Emergency Department (HOSPITAL_COMMUNITY): Payer: Self-pay

## 2016-10-24 ENCOUNTER — Emergency Department (HOSPITAL_COMMUNITY)
Admission: EM | Admit: 2016-10-24 | Discharge: 2016-10-24 | Disposition: A | Discharge status: Home / Self Care | Payer: Self-pay | Attending: Emergency Medicine | Admitting: Emergency Medicine

## 2016-10-24 ENCOUNTER — Telehealth: Payer: Self-pay | Admitting: Emergency Medicine

## 2016-10-24 ENCOUNTER — Encounter (HOSPITAL_COMMUNITY): Payer: Self-pay | Admitting: Emergency Medicine

## 2016-10-24 ENCOUNTER — Emergency Department (HOSPITAL_BASED_OUTPATIENT_CLINIC_OR_DEPARTMENT_OTHER): Payer: Self-pay

## 2016-10-24 DIAGNOSIS — M79609 Pain in unspecified limb: Secondary | ICD-10-CM

## 2016-10-24 DIAGNOSIS — I252 Old myocardial infarction: Secondary | ICD-10-CM | POA: Insufficient documentation

## 2016-10-24 DIAGNOSIS — N12 Tubulo-interstitial nephritis, not specified as acute or chronic: Secondary | ICD-10-CM

## 2016-10-24 DIAGNOSIS — J45909 Unspecified asthma, uncomplicated: Secondary | ICD-10-CM | POA: Insufficient documentation

## 2016-10-24 DIAGNOSIS — I82491 Acute embolism and thrombosis of other specified deep vein of right lower extremity: Secondary | ICD-10-CM | POA: Insufficient documentation

## 2016-10-24 DIAGNOSIS — I10 Essential (primary) hypertension: Secondary | ICD-10-CM | POA: Insufficient documentation

## 2016-10-24 DIAGNOSIS — R1031 Right lower quadrant pain: Secondary | ICD-10-CM | POA: Insufficient documentation

## 2016-10-24 DIAGNOSIS — I824Y1 Acute embolism and thrombosis of unspecified deep veins of right proximal lower extremity: Secondary | ICD-10-CM

## 2016-10-24 DIAGNOSIS — Z79899 Other long term (current) drug therapy: Secondary | ICD-10-CM | POA: Insufficient documentation

## 2016-10-24 DIAGNOSIS — N10 Acute tubulo-interstitial nephritis: Secondary | ICD-10-CM | POA: Insufficient documentation

## 2016-10-24 DIAGNOSIS — Z7982 Long term (current) use of aspirin: Secondary | ICD-10-CM | POA: Insufficient documentation

## 2016-10-24 DIAGNOSIS — Z794 Long term (current) use of insulin: Secondary | ICD-10-CM | POA: Insufficient documentation

## 2016-10-24 DIAGNOSIS — I2510 Atherosclerotic heart disease of native coronary artery without angina pectoris: Secondary | ICD-10-CM | POA: Insufficient documentation

## 2016-10-24 DIAGNOSIS — E119 Type 2 diabetes mellitus without complications: Secondary | ICD-10-CM | POA: Insufficient documentation

## 2016-10-24 DIAGNOSIS — M7989 Other specified soft tissue disorders: Secondary | ICD-10-CM

## 2016-10-24 DIAGNOSIS — Z87891 Personal history of nicotine dependence: Secondary | ICD-10-CM | POA: Insufficient documentation

## 2016-10-24 LAB — URINALYSIS, ROUTINE W REFLEX MICROSCOPIC
BILIRUBIN URINE: NEGATIVE
BILIRUBIN URINE: NEGATIVE
Glucose, UA: >=500 mg/dL — AB
Glucose, UA: >=500 mg/dL — AB
Hgb urine dipstick: NEGATIVE
KETONES UR: NEGATIVE mg/dL
Ketones, ur: NEGATIVE mg/dL
Nitrite: NEGATIVE
Nitrite: POSITIVE — AB
PROTEIN: NEGATIVE mg/dL
Protein, ur: NEGATIVE mg/dL
SPECIFIC GRAVITY, URINE: 1.025 (ref 1.005–1.030)
SQUAMOUS EPITHELIAL / LPF: NONE SEEN
Specific Gravity, Urine: 1.03 (ref 1.005–1.030)
pH: 5 (ref 5.0–8.0)
pH: 6 (ref 5.0–8.0)

## 2016-10-24 LAB — COMPREHENSIVE METABOLIC PANEL
ALBUMIN: 3.2 g/dL — AB (ref 3.5–5.0)
ALK PHOS: 66 U/L (ref 38–126)
ALT: 11 U/L — ABNORMAL LOW (ref 14–54)
ANION GAP: 9 (ref 5–15)
AST: 22 U/L (ref 15–41)
BILIRUBIN TOTAL: 0.4 mg/dL (ref 0.3–1.2)
BUN: 10 mg/dL (ref 6–20)
CO2: 22 mmol/L (ref 22–32)
Calcium: 9.4 mg/dL (ref 8.9–10.3)
Chloride: 100 mmol/L — ABNORMAL LOW (ref 101–111)
Creatinine, Ser: 1.02 mg/dL — ABNORMAL HIGH (ref 0.44–1.00)
GFR calc Af Amer: >60 mL/min (ref >60)
GFR calc non Af Amer: >60 mL/min (ref >60)
GLUCOSE: 403 mg/dL — AB (ref 65–99)
POTASSIUM: 4.3 mmol/L (ref 3.5–5.1)
Sodium: 131 mmol/L — ABNORMAL LOW (ref 135–145)
Total Protein: 8.5 g/dL — ABNORMAL HIGH (ref 6.5–8.1)

## 2016-10-24 LAB — CBC
HEMATOCRIT: 33 % — AB (ref 36.0–46.0)
HEMOGLOBIN: 11.1 g/dL — AB (ref 12.0–15.0)
MCH: 22.7 pg — ABNORMAL LOW (ref 26.0–34.0)
MCHC: 33.6 g/dL (ref 30.0–36.0)
MCV: 67.6 fL — ABNORMAL LOW (ref 78.0–100.0)
Platelets: 302 10*3/uL (ref 150–400)
RBC: 4.88 MIL/uL (ref 3.87–5.11)
RDW: 20.3 % — AB (ref 11.5–15.5)
WBC: 14.1 10*3/uL — AB (ref 4.0–10.5)

## 2016-10-24 LAB — POC URINE PREG, ED: PREG TEST UR: NEGATIVE

## 2016-10-24 MED ORDER — LEVOFLOXACIN 750 MG PO TABS: 750.0000 mg | ORAL_TABLET | Freq: Every day | ORAL | 0 refills | Status: AC

## 2016-10-24 MED ORDER — RIVAROXABAN 15 MG PO TABS
15.0000 mg | ORAL_TABLET | Freq: Once | ORAL | Status: AC
  Administered 2016-10-24: 15 mg via ORAL
  Filled 2016-10-24: qty 1

## 2016-10-24 MED ORDER — DEXTROSE 5 % IV SOLN
1.0000 g | Freq: Once | INTRAVENOUS | Status: AC
  Administered 2016-10-24: 1 g via INTRAVENOUS
  Filled 2016-10-24: qty 10

## 2016-10-24 MED ORDER — RIVAROXABAN (XARELTO) VTE STARTER PACK (15 & 20 MG): ORAL_TABLET | ORAL | 0 refills | Status: DC

## 2016-10-24 MED ORDER — HYDROMORPHONE HCL 1 MG/ML IJ SOLN
1.0000 mg | Freq: Once | INTRAMUSCULAR | Status: AC
  Administered 2016-10-24: 1 mg via INTRAVENOUS
  Filled 2016-10-24: qty 1

## 2016-10-24 MED ORDER — SODIUM CHLORIDE 0.9 % IV BOLUS (SEPSIS)
1000.0000 mL | Freq: Once | INTRAVENOUS | Status: AC
  Administered 2016-10-24: 1000 mL via INTRAVENOUS

## 2016-10-24 NOTE — Progress Notes (Addendum)
Preliminary results by tech - Right Lower Ext. Venous Duplex Completed. Positive for acute deep vein thrombosis involving the distal common femoral vein, femoral vein, popliteal vein and the calf veins. Bilateral iliac veins are patent with thrombus. The IVC was not clearly visualized due to bowel gas. Results given to Shary Decamp, PA, Oda Cogan, BS, RDMS, RVT

## 2016-10-24 NOTE — ED Notes (Signed)
Pt updated on wait, pt stated understanding.

## 2016-10-24 NOTE — ED Notes (Signed)
Patient able to ambulate independently  

## 2016-10-24 NOTE — ED Triage Notes (Signed)
Pt. Stated, My right leg has been swollen for 4 days and pain.  My back is also hurting.

## 2016-10-24 NOTE — Discharge Instructions (Signed)
Please read and follow all provided instructions.  Your diagnoses today include:  1. Pyelonephritis   2. Acute deep vein thrombosis (DVT) of proximal vein of right lower extremity (HCC)     Tests performed today include:  Vital signs. See below for your results today.   Medications prescribed:   Take as prescribed   Home care instructions:  Follow any educational materials contained in this packet.  Follow-up instructions: Please follow-up with your primary care provider for further evaluation of symptoms and treatment   Return instructions:   Please return to the Emergency Department if you do not get better, if you get worse, or new symptoms OR  - Fever (temperature greater than 101.62F)  - Bleeding that does not stop with holding pressure to the area    -Severe pain (please note that you may be more sore the day after your accident)  - Chest Pain  - Difficulty breathing  - Severe nausea or vomiting  - Inability to tolerate food and liquids  - Passing out  - Skin becoming red around your wounds  - Change in mental status (confusion or lethargy)  - New numbness or weakness     Please return if you have any other emergent concerns.  Additional Information:  Your vital signs today were: BP (!) 134/92    Pulse 76    Temp 97.6 F (36.4 C) (Oral)    Resp 16    Ht 5\' 7"  (1.702 m)    Wt 96.2 kg (212 lb)    SpO2 97%    BMI 33.20 kg/m  If your blood pressure (BP) was elevated above 135/85 this visit, please have this repeated by your doctor within one month. ---------------    Information on my medicine - XARELTO (rivaroxaban)  This medication education was reviewed with me or my healthcare representative as part of my discharge preparation.  The pharmacist that spoke with me during my hospital stay was:  Saundra Shelling, [place]? Xarelto was prescribed to treat blood clots that may have been found in the veins of your legs (deep vein  thrombosis) or in your lungs (pulmonary embolism) and to reduce the risk of them occurring again.  What do you need to know about Xarelto? The starting dose is one 15 mg tablet taken TWICE daily with food for the FIRST 21 DAYS then on (enter date)  11/14/16  the dose is changed to one 20 mg tablet taken ONCE A DAY with your evening meal.  DO NOT stop taking Xarelto without talking to the health care provider who prescribed the medication.  Refill your prescription for 20 mg tablets before you run out.  After discharge, you should have regular check-up appointments with your healthcare provider that is prescribing your Xarelto.  In the future your dose may need to be changed if your kidney function changes by a significant amount.  What do you do if you miss a dose? If you are taking Xarelto TWICE DAILY and you miss a dose, take it as soon as you remember. You may take two 15 mg tablets (total 30 mg) at the same time then resume your regularly scheduled 15 mg twice daily the next day.  If you are taking Xarelto ONCE DAILY and you miss a dose, take it as soon as you remember on the same day then continue your regularly scheduled once daily regimen the next day. Do not take two doses of Xarelto at  the same time.   Important Safety Information Xarelto is a blood thinner medicine that can cause bleeding. You should call your healthcare provider right away if you experience any of the following: ? Bleeding from an injury or your nose that does not stop. ? Unusual colored urine (red or dark brown) or unusual colored stools (red or black). ? Unusual bruising for unknown reasons. ? A serious fall or if you hit your head (even if there is no bleeding).  Some medicines may interact with Xarelto and might increase your risk of bleeding while on Xarelto. To help avoid this, consult your healthcare provider or pharmacist prior to using any new prescription or non-prescription medications, including  herbals, vitamins, non-steroidal anti-inflammatory drugs (NSAIDs) and supplements.  This website has more information on Xarelto: [email or web address].[email or web address]

## 2016-10-24 NOTE — ED Notes (Signed)
Pt returned from US

## 2016-10-24 NOTE — Telephone Encounter (Signed)
LOST TO FOLLOWUP 

## 2016-10-24 NOTE — ED Notes (Signed)
Pt continues to be in radiology 

## 2016-10-24 NOTE — ED Notes (Signed)
Pharmacy at bedside

## 2016-10-24 NOTE — ED Notes (Signed)
Patient transported to Ultrasound 

## 2016-10-24 NOTE — ED Notes (Signed)
Signature pad in room not functioning.  Pt verbalized understanding of discharge instructions, follow up care and return precautions.

## 2016-10-24 NOTE — ED Provider Notes (Signed)
[place] DEPT Provider Note   CSN: [id number] Arrival date & time: 10/24/16  1021     History   Chief Complaint Chief Complaint  Patient presents with  . Leg Pain  . Back Pain    HPI Shirley [name] is a 41 y.o. female.  HPI  41 y.o. female with a hx of CAD, DM2, HTN, HLD, presents to the Emergency Department today due to right flank pain since last week. Pt seen in ED and diagnosed with UTI. Unable to full Rx due to cost. Notes continued pain in flank that radiates to RLQ. Notes urinary frequency with odor. No dysuria. Notes worsening pain and rates 6/10. Feels like pressure sensation. No N/V. No CP/SOB. No fevers. No meds PTA. Pt also notes right leg swelling x 4-5 days. Notes pain behind calf as well as behind knee. Notes no hx same. No hx DVT/PE. Denies trauma to area. Does not pain behind right knee that she has had in past and thinks this may be attributed. Pt does not take any anticoagulation. No recent surgeries. No recent travel. Pt is on depo shot. No other symptoms noted.   Past Medical History:  Diagnosis Date  . Abscess of tunica vaginalis    10/09- Abundant S. aureus- sensitive to all abx  . Anxiety   . Blood dyscrasia   . CAD (coronary artery disease) 06/15/2006   s/p Subendocardial MI with PDA angioplasty(no stent) on 06/15/06 and relook  cath 06/19/06 showed patency of site. Cath 12/10- no restenosis or significant CAD progression  . CVA (cerebral vascular accident) ([place]) ~ 02/2014   denies residual on 04/22/2014  . CVA (cerebral vascular accident) [facility])    history of remote right cerebellar infarct noted on head CT at least since 10/2011  . Depression   . Diabetes mellitus type 2, uncontrolled, with complications ([place])   . Fibromyalgia   . Gastritis   . Gastroparesis    secondary to poorly controlled DM, last emptying study performed 01/2010  was normal but may be falsely positive as pt was on reglan  . GERD (gastroesophageal reflux disease)   . Hepatitis  B, chronic (HCC)    Hep BeAb+,Hep B cAb+ & Hep BsAg+ (9/06)  . History of pyelonephritis    H/o GrpB Pyelonephritis (9/06) and UTI- 07/11- E.Coli, 12/10- GBS  . Hyperlipidemia   . Hypertension   . Iron deficiency anemia   . Irregular menses    Small ovarian follicles seen on BO(1/75)  . MI (myocardial infarction) ([place]) 05/2006   PDA percutaneous transluminal coronary angioplasty  . Migraine    "weekly" (04/22/2014)  . N&V (nausea and vomiting)    Chronic. Unclear etiology with multiple admission and ED visits. CT abdomen with and without contrast (02/2011)  showed no acute process. Gastic Emptying scan (01/2010) was normal. Ultrasound of the abdomen was within normal limits. Hepatitis B viral load was undectable. HIV NR. EGD - gastritis, Hpylori + s/p Rx  . Obesity   . OSA (obstructive sleep apnea)    "suppose to wear mask but I don't" (04/22/2014)  . Peripheral neuropathy   . Pneumonia    "this is probably the 2nd or 3rd time I've had pneumonia" (04/22/2014)  . Recurrent boils   . Seasonal asthma   . Substance abuse   . Thrombocytosis ([place])    Hem/Onc suggested 2/2 chronic hepatits and/or iron deficiency anemia    Patient Active Problem List   Diagnosis Date Noted  . Nausea and vomiting 09/23/2016  .  Intractable nausea and vomiting 08/11/2016  . Type 2 diabetes mellitus with hyperglycemia ([place]) 04/07/2016  . Nausea & vomiting 04/07/2016  . Diabetic gastroparesis ([place]) 04/06/2016  . Non-intractable cyclical vomiting with nausea   . Dehydration 03/27/2015  . Intractable vomiting with nausea 11/11/2014  . Gastroparesis 11/11/2014  . Cough   . Hematemesis 10/13/2014  . DKA (diabetic ketoacidoses) ([place]) 10/13/2014  . Increased anion gap metabolic acidosis 19/41/7408  . Tachycardia 07/21/2014  . Abdominal pain, chronic, left lower quadrant   . Nausea with vomiting   . Diabetic gastroparesis associated with type 2 diabetes mellitus ([place]) 04/28/2014  . History of Helicobacter  pylori infection 04/22/2014  . Vaginal discharge 02/18/2014  . Atypical chest pain 07/22/2013  . Unspecified constipation 07/21/2013  . Tinea corporis 07/21/2013  . Intractable vomiting 04/29/2013  . Dysmenorrhea 04/22/2013  . UTI (urinary tract infection) 07/15/2012  . Headache(784.0) 02/08/2012  . Health care maintenance 01/22/2012  . Chronic hepatitis B ([place]) 03/07/2011  . History of leukocytosis 04/06/2010  . THROMBOCYTOSIS 04/06/2010  . Polysubstance abuse 02/23/2010  . Iron deficiency anemia 11/22/2009  . PERIPHERAL NEUROPATHY 10/01/2009  . Hyperlipidemia 08/30/2009  . Diabetic polyneuropathy ([place]) 08/30/2009  . Hidradenitis (recurrent boils) 07/07/2008  . Depression 12/27/2007  . Abdominal pain, left lower quadrant 11/21/2007  . FIBROMYALGIA 10/30/2007  . BACK PAIN 04/01/2007  . OBSTRUCTIVE SLEEP APNEA 01/17/2007  . ANXIETY DEPRESSION 06/27/2006  . Chronic ischemic heart disease 06/15/2006  . OBESITY, MORBID 05/15/2006  . MIGRAINE HEADACHE 05/15/2006  . Asthma 05/15/2006  . Essential hypertension 01/16/2006  . IRREGULAR MENSTRUATION 01/16/2006  . PEDAL EDEMA 01/16/2006  . Poorly controlled type 2 diabetes mellitus with peripheral neuropathy ([place]) 01/16/1989    Past Surgical History:  Procedure Laterality Date  . CESAREAN SECTION  1997  . CORONARY ANGIOPLASTY WITH STENT PLACEMENT  2008   "2 stents"  . ESOPHAGOGASTRODUODENOSCOPY N/A 04/23/2014   Procedure: ESOPHAGOGASTRODUODENOSCOPY (EGD);  Surgeon: Winfield Cunas., MD;  Location[facility] ENDOSCOPY;  Service: Endoscopy;  Laterality: N/A;    OB History    No data available       Home Medications    Prior to Admission medications   Medication Sig Start Date End Date Taking? Authorizing Provider  acetaminophen (TYLENOL) 500 MG tablet Take 500 mg by mouth every 6 (six) hours as needed.    [provider]  albuterol (PROVENTIL HFA;VENTOLIN HFA) 108 (90 Base) MCG/ACT inhaler Inhale 2 puffs into the lungs  every 4 (four) hours as needed for wheezing or shortness of breath. 09/27/16   Clent Demark, PA-C  aspirin EC 81 MG tablet Take 1 tablet (81 mg total) by mouth daily. 09/27/16   Clent Demark, PA-C  cephALEXin (KEFLEX) 500 MG capsule Take 2 capsules (1,000 mg total) by mouth 2 (two) times daily. 10/18/16   Lawyer, Harrell Gave, PA-C  clindamycin (CLINDAGEL) 1 % gel Apply topically 2 (two) times daily. 09/27/16   Clent Demark, PA-C  cloNIDine (CATAPRES) 0.1 MG tablet Take 1 tablet (0.1 mg total) by mouth daily as needed. Take 1 tablet if bp above 144 systolic 12/10/54 06/23/47  Nita Sells, MD  erythromycin ethylsuccinate (EES) 200 MG/5ML suspension Take 2.5 mLs (100 mg total) by mouth 4 (four) times daily -  with meals and at bedtime. 09/27/16 10/27/16  Clent Demark, PA-C  ferrous sulfate 325 (65 FE) MG tablet Take 1 tablet (325 mg total) by mouth 2 (two) times daily with a meal. 09/05/16   Clent Demark,  PA-C  glucose blood (FREESTYLE TEST STRIPS) test strip Use as instructed 09/05/16   Clent Demark, PA-C  glucose monitoring kit (FREESTYLE) monitoring kit 1 each by Does not apply route 4 (four) times daily - after meals and at bedtime. 1 month Diabetic Testing Supplies for QAC-QHS accuchecks. 09/05/16   Clent Demark, PA-C  hydrochlorothiazide (HYDRODIURIL) 25 MG tablet Take 1 tablet (25 mg total) by mouth daily. Take on tablet in the morning. 09/27/16   Clent Demark, PA-C  insulin aspart (NOVOLOG) 100 UNIT/ML injection Inject 10 Units into the skin once. 09/27/16 09/27/16  Clent Demark, PA-C  insulin NPH-regular Human (NOVOLIN 70/30) (70-30) 100 UNIT/ML injection 25 units in the morning and 25 units in the evening. 09/27/16   Clent Demark, PA-C  INSULIN SYRINGE .5CC/28G (INS SYRINGE/NEEDLE .5CC/28G) 28G X 1/2" 0.5 ML MISC Please provide 1 month supply 09/27/16   Clent Demark, PA-C  Lancets (FREESTYLE) lancets Use as instructed 09/27/16   Clent Demark,  PA-C  losartan (COZAAR) 50 MG tablet Take 1 tablet (50 mg total) by mouth daily. 09/27/16   Clent Demark, PA-C  lovastatin (MEVACOR) 20 MG tablet Take 1 tablet (20 mg total) by mouth at bedtime. 09/27/16   Clent Demark, PA-C  metoCLOPramide (REGLAN) 10 MG tablet Take 1 tablet (10 mg total) by mouth 4 (four) times daily -  before meals and at bedtime. 09/27/16   Clent Demark, PA-C  metoprolol tartrate (LOPRESSOR) 50 MG tablet Take 1 tablet (50 mg total) by mouth 2 (two) times daily. 09/27/16   Clent Demark, PA-C  omeprazole (PRILOSEC) 40 MG capsule Take 1 capsule (40 mg total) by mouth daily. 09/27/16   Clent Demark, PA-C  promethazine (PHENERGAN) 25 MG tablet Take 1 tablet (25 mg total) by mouth every 8 (eight) hours as needed for nausea or vomiting. 09/27/16   Clent Demark, PA-C  promethazine (PHENERGAN) 25 MG tablet Take 1 tablet (25 mg total) by mouth every 8 (eight) hours as needed for nausea or vomiting. 10/18/16   Dalia Heading, PA-C    Family History Family History  Problem Relation Age of Onset  . Diabetes Father     Social History Social History  Substance Use Topics  . Smoking status: Former Smoker    Types: Cigarettes    Quit date: 04/24/1996  . Smokeless tobacco: Never Used     Comment: quit smoking cigarettes age 2  . Alcohol use 0.0 oz/week     Comment: 04/22/2014 "might have a few drinks a month"     Allergies   Lisinopril and Morphine and related   Review of Systems Review of Systems ROS reviewed and all are negative for acute change except as noted in the HPI.  Physical Exam Updated Vital Signs BP (!) 134/92   Pulse 76   Temp 97.6 F (36.4 C) (Oral)   Resp 16   Ht 5' 7"  (1.702 m)   Wt 96.2 kg (212 lb)   SpO2 97%   BMI 33.20 kg/m   Physical Exam  Constitutional: She is oriented to person, place, and time. Vital signs are normal. She appears well-developed and well-nourished.  HENT:  Head: Normocephalic and atraumatic.    Right Ear: Hearing normal.  Left Ear: Hearing normal.  Eyes: Conjunctivae and EOM are normal. Pupils are equal, round, and reactive to light.  Neck: Normal range of motion. Neck supple.  Cardiovascular: Normal rate, regular rhythm, normal heart sounds and  intact distal pulses.   Pulmonary/Chest: Effort normal and breath sounds normal.  Abdominal: Soft. Normal appearance. There is no tenderness. There is CVA tenderness (right). There is no rigidity, no rebound, no guarding, no tenderness at McBurney's point and negative Murphy's sign.  Musculoskeletal: Normal range of motion.  RLE with 2+ pitting edema below knee. TTP posterior calf. No erythema or signs of infection. LEL with mild 2+ pitting edema to ankle. NVI bilaterally. Distal pulses appreciated.   Neurological: She is alert and oriented to person, place, and time.  Skin: Skin is warm and dry.  Psychiatric: She has a normal mood and affect. Her speech is normal and behavior is normal. Thought content normal.  Nursing note and vitals reviewed.  ED Treatments / Results  Labs (all labs ordered are listed, but only abnormal results are displayed) Labs Reviewed  CBC - Abnormal; Notable for the following:       Result Value   WBC 14.1 (*)    Hemoglobin 11.1 (*)    HCT 33.0 (*)    MCV 67.6 (*)    MCH 22.7 (*)    RDW 20.3 (*)    All other components within normal limits  COMPREHENSIVE METABOLIC PANEL - Abnormal; Notable for the following:    Sodium 131 (*)    Chloride 100 (*)    Glucose, Bld 403 (*)    Creatinine, Ser 1.02 (*)    Total Protein 8.5 (*)    Albumin 3.2 (*)    ALT 11 (*)    All other components within normal limits  URINALYSIS, ROUTINE W REFLEX MICROSCOPIC - Abnormal; Notable for the following:    APPearance CLOUDY (*)    Glucose, UA >=500 (*)    Hgb urine dipstick MODERATE (*)    Leukocytes, UA LARGE (*)    Bacteria, UA MANY (*)    Squamous Epithelial / LPF 6-30 (*)    All other components within normal limits   URINALYSIS, ROUTINE W REFLEX MICROSCOPIC  POC URINE PREG, ED    EKG  EKG Interpretation None       Radiology Ct Renal Stone Study  Result Date: 10/24/2016 CLINICAL DATA:  41 year old female with right flank and back pain since yesterday. EXAM: CT ABDOMEN AND PELVIS WITHOUT CONTRAST TECHNIQUE: Multidetector CT imaging of the abdomen and pelvis was performed following the standard protocol without IV contrast. COMPARISON:  CT Abdomen and Pelvis 09/23/2016 and earlier. FINDINGS: Lower chest: Negative lung bases. No pleural effusion. Borderline to mild cardiomegaly. No pericardial effusion, although there might be a small right posterior cardiophrenic angle cyst (such as small pericardial cyst) which has not significantly changed since 2011. Hepatobiliary: Negative noncontrast liver and gallbladder. Pancreas: Stable fatty atrophy of the pancreatic head and uncinate. Spleen: Negative. Adrenals/Urinary Tract: Normal adrenal glands. Negative noncontrast left kidney and left ureter. Unremarkable urinary bladder. Negative noncontrast right kidney and right ureter. Small pelvic phleboliths are stable. No urologic calculus identified. Stomach/Bowel: Negative sigmoid colon and rectum except for some retained stool. Similar retained stool in the left colon. Negative transverse colon. Negative right colon and appendix. Negative terminal ileum. No dilated small bowel. Negative stomach and duodenum. No abdominal free fluid. Vascular/Lymphatic: Vascular patency is not evaluated in the absence of IV contrast. No lymphadenopathy. Reproductive: Chronic rounded enlargement of the uterine fundus suggesting a right eccentric fundal fibroid, stable. Adnexa appears stable and within normal limits. Other: No pelvic free fluid. Musculoskeletal: Chronic lumbosacral junction disc and endplate degeneration. No acute osseous abnormality identified. IMPRESSION:  1. No urologic calculus or obstructive uropathy. 2. No acute or  inflammatory process identified.  Normal appendix. 3. Fibroid uterus. 4. Advanced chronic L5-S1 disc degeneration. Electronically Signed   By: Genevie Ann M.D.   On: 10/24/2016 15:48    Procedures Procedures (including critical care time)  Medications Ordered in ED Medications  Rivaroxaban (XARELTO) tablet 15 mg (not administered)  cefTRIAXone (ROCEPHIN) 1 g in dextrose 5 % 50 mL IVPB (1 g Intravenous New Bag/Given 10/24/16 1616)  sodium chloride 0.9 % bolus 1,000 mL (1,000 mLs Intravenous New Bag/Given 10/24/16 1444)  HYDROmorphone (DILAUDID) injection 1 mg (1 mg Intravenous Given 10/24/16 1613)     Initial Impression / Assessment and Plan / ED Course  I have reviewed the triage vital signs and the nursing notes.  Pertinent labs & imaging results that were available during my care of the patient were reviewed by me and considered in my medical decision making (see chart for details).  Final Clinical Impressions(s) / ED Diagnoses  {I have reviewed and evaluated the relevant laboratory values. {I have reviewed and evaluated the relevant imaging studies.  {I have reviewed the relevant previous healthcare records.  {I obtained HPI from historian. {Patient discussed with supervising physician.  ED Course:  Assessment: Pt is a 41 y.o. female hx of CAD, DM2, HTN, HLD, presents to the Emergency Department today due to right flank pain since last week. Pt seen in ED and diagnosed with UTI. Unable to full Rx due to cost. Notes continued pain in flank that radiates to RLQ. Notes urinary frequency with odor. No dysuria. Notes worsening pain and rates 6/10. Feels like pressure sensation. No N/V. No CP/SOB. No fevers. No meds PTA. Pt also notes right leg swelling x 4-5 days. Notes pain behind calf as well as behind knee. Notes no hx same. No hx DVT/PE. Denies trauma to area. Does not pain behind right knee that she has had in past and thinks this may be attributed. Pt does not take any anticoagulation. No recent  surgeries. No recent travel. Pt is on depo shot.  On exam, pt in NAD. Nontoxic/nonseptic appearing. VSS. Afebrile. Lungs CTA. Heart RRR. Abdomen nontender soft. Noted right CVA tenderness. Also noted RLE with 2+ pitting edema below knee. TTP posterior calf. No erythema or signs of infection. LEL with mild 2+ pitting edema to ankle. NVI bilaterally. CBC with mild leukocytosis. CMP unremarkable. Creatinine WNL. UA shows evidence of UA. Culture sent. CT Renal unremarkable. DVT US RLE positive (see below). Given Xarelto in ED. Plan is to DC home with follow up to PCP. Given Rx Levaquin. Given Xarelto for DVT. Again pt denies CP/SOB. At time of discharge, Patient is in no acute distress. Vital Signs are stable. Patient is able to ambulate. Patient able to tolerate PO.   DVT US Right Lower Ext. Venous Duplex Completed. Positive for acute deep vein thrombosis involving the distal common femoral vein, femoral vein, popliteal vein and the calf veins. Bilateral iliac veins are patent with thrombus. The IVC was not clearly visualized due to bowel gas  Disposition/Plan:  DC Home Additional Verbal discharge instructions given and discussed with patient.  Pt Instructed to f/u with PCP in the next week for evaluation and treatment of symptoms. Return precautions given Pt acknowledges and agrees with plan  Supervising Physician Daleen Bo, MD  Final diagnoses:  Pyelonephritis  Acute deep vein thrombosis (DVT) of proximal vein of right lower extremity [facility])    New Prescriptions New Prescriptions  No medications on file     Shary Decamp, Hershal Coria 10/24/16 1748    Daleen Bo, MD 10/25/16 [phone]

## 2016-10-26 MED FILL — **XARELTO 15 MG TABLET: 15 MG | 21 days supply | Qty: 42 | Fill #0

## 2016-10-26 MED FILL — ?LEVOFLOXACIN 750 MG TAB: 750 | 5 days supply | Qty: 5 | Fill #0

## 2016-10-27 ENCOUNTER — Ambulatory Visit (INDEPENDENT_AMBULATORY_CARE_PROVIDER_SITE_OTHER): Payer: Medicaid Other | Admitting: Physician Assistant

## 2016-10-27 LAB — URINE CULTURE: Culture: >=100000 — AB

## 2016-10-29 ENCOUNTER — Encounter (HOSPITAL_COMMUNITY): Payer: Self-pay | Admitting: Emergency Medicine

## 2016-10-29 DIAGNOSIS — Z7901 Long term (current) use of anticoagulants: Secondary | ICD-10-CM | POA: Insufficient documentation

## 2016-10-29 DIAGNOSIS — Z8719 Personal history of other diseases of the digestive system: Secondary | ICD-10-CM | POA: Insufficient documentation

## 2016-10-29 DIAGNOSIS — Z888 Allergy status to other drugs, medicaments and biological substances status: Secondary | ICD-10-CM | POA: Insufficient documentation

## 2016-10-29 DIAGNOSIS — I10 Essential (primary) hypertension: Secondary | ICD-10-CM | POA: Insufficient documentation

## 2016-10-29 DIAGNOSIS — Z87440 Personal history of urinary (tract) infections: Secondary | ICD-10-CM | POA: Insufficient documentation

## 2016-10-29 DIAGNOSIS — R000 Tachycardia, unspecified: Secondary | ICD-10-CM | POA: Insufficient documentation

## 2016-10-29 DIAGNOSIS — E119 Type 2 diabetes mellitus without complications: Secondary | ICD-10-CM | POA: Insufficient documentation

## 2016-10-29 DIAGNOSIS — Z794 Long term (current) use of insulin: Secondary | ICD-10-CM | POA: Insufficient documentation

## 2016-10-29 DIAGNOSIS — K3184 Gastroparesis: Secondary | ICD-10-CM | POA: Insufficient documentation

## 2016-10-29 DIAGNOSIS — Z8679 Personal history of other diseases of the circulatory system: Secondary | ICD-10-CM | POA: Insufficient documentation

## 2016-10-29 DIAGNOSIS — I2510 Atherosclerotic heart disease of native coronary artery without angina pectoris: Secondary | ICD-10-CM | POA: Insufficient documentation

## 2016-10-29 DIAGNOSIS — Z7982 Long term (current) use of aspirin: Secondary | ICD-10-CM | POA: Insufficient documentation

## 2016-10-29 DIAGNOSIS — I2699 Other pulmonary embolism without acute cor pulmonale: Principal | ICD-10-CM | POA: Insufficient documentation

## 2016-10-29 DIAGNOSIS — D509 Iron deficiency anemia, unspecified: Secondary | ICD-10-CM | POA: Insufficient documentation

## 2016-10-29 DIAGNOSIS — Z885 Allergy status to narcotic agent status: Secondary | ICD-10-CM | POA: Insufficient documentation

## 2016-10-29 DIAGNOSIS — Z86718 Personal history of other venous thrombosis and embolism: Secondary | ICD-10-CM | POA: Insufficient documentation

## 2016-10-29 DIAGNOSIS — Z87891 Personal history of nicotine dependence: Secondary | ICD-10-CM | POA: Insufficient documentation

## 2016-10-29 NOTE — ED Triage Notes (Signed)
Pt presents reporting CP that started while walking to store tonight. Also endorses SOB, lightheadedness, N/V.

## 2016-10-30 ENCOUNTER — Other Ambulatory Visit: Payer: Self-pay

## 2016-10-30 ENCOUNTER — Emergency Department (HOSPITAL_COMMUNITY): Payer: Self-pay

## 2016-10-30 ENCOUNTER — Observation Stay (HOSPITAL_COMMUNITY)
Admission: EM | Admit: 2016-10-30 | Discharge: 2016-10-31 | Disposition: A | Discharge status: Home / Self Care | Payer: Self-pay | Attending: Internal Medicine | Admitting: Internal Medicine

## 2016-10-30 DIAGNOSIS — E785 Hyperlipidemia, unspecified: Secondary | ICD-10-CM | POA: Diagnosis present

## 2016-10-30 DIAGNOSIS — K3184 Gastroparesis: Secondary | ICD-10-CM

## 2016-10-30 DIAGNOSIS — D509 Iron deficiency anemia, unspecified: Secondary | ICD-10-CM | POA: Diagnosis present

## 2016-10-30 DIAGNOSIS — E119 Type 2 diabetes mellitus without complications: Secondary | ICD-10-CM | POA: Diagnosis present

## 2016-10-30 DIAGNOSIS — I82401 Acute embolism and thrombosis of unspecified deep veins of right lower extremity: Secondary | ICD-10-CM

## 2016-10-30 DIAGNOSIS — I10 Essential (primary) hypertension: Secondary | ICD-10-CM | POA: Diagnosis present

## 2016-10-30 DIAGNOSIS — E1142 Type 2 diabetes mellitus with diabetic polyneuropathy: Secondary | ICD-10-CM | POA: Diagnosis present

## 2016-10-30 DIAGNOSIS — N390 Urinary tract infection, site not specified: Secondary | ICD-10-CM | POA: Diagnosis present

## 2016-10-30 DIAGNOSIS — I2699 Other pulmonary embolism without acute cor pulmonale: Secondary | ICD-10-CM | POA: Diagnosis present

## 2016-10-30 DIAGNOSIS — E1165 Type 2 diabetes mellitus with hyperglycemia: Secondary | ICD-10-CM | POA: Diagnosis present

## 2016-10-30 DIAGNOSIS — R112 Nausea with vomiting, unspecified: Secondary | ICD-10-CM | POA: Diagnosis present

## 2016-10-30 DIAGNOSIS — I259 Chronic ischemic heart disease, unspecified: Secondary | ICD-10-CM | POA: Diagnosis present

## 2016-10-30 DIAGNOSIS — E1143 Type 2 diabetes mellitus with diabetic autonomic (poly)neuropathy: Secondary | ICD-10-CM | POA: Diagnosis present

## 2016-10-30 DIAGNOSIS — I82409 Acute embolism and thrombosis of unspecified deep veins of unspecified lower extremity: Secondary | ICD-10-CM

## 2016-10-30 LAB — URINALYSIS, ROUTINE W REFLEX MICROSCOPIC
BACTERIA UA: NONE SEEN
Bilirubin Urine: NEGATIVE
Glucose, UA: >=500 mg/dL — AB
Ketones, ur: 5 mg/dL — AB
Nitrite: NEGATIVE
Protein, ur: NEGATIVE mg/dL
SPECIFIC GRAVITY, URINE: 1.044 — AB (ref 1.005–1.030)
pH: 6 (ref 5.0–8.0)

## 2016-10-30 LAB — CBC
HCT: 29 % — ABNORMAL LOW (ref 36.0–46.0)
HEMOGLOBIN: 9.4 g/dL — AB (ref 12.0–15.0)
MCH: 21.9 pg — AB (ref 26.0–34.0)
MCHC: 32.4 g/dL (ref 30.0–36.0)
MCV: 67.4 fL — ABNORMAL LOW (ref 78.0–100.0)
Platelets: 425 10*3/uL — ABNORMAL HIGH (ref 150–400)
RBC: 4.3 MIL/uL (ref 3.87–5.11)
RDW: 20.1 % — ABNORMAL HIGH (ref 11.5–15.5)
WBC: 11.3 10*3/uL — AB (ref 4.0–10.5)

## 2016-10-30 LAB — BASIC METABOLIC PANEL
ANION GAP: 9 (ref 5–15)
BUN: 15 mg/dL (ref 6–20)
CALCIUM: 9 mg/dL (ref 8.9–10.3)
CHLORIDE: 101 mmol/L (ref 101–111)
CO2: 22 mmol/L (ref 22–32)
Creatinine, Ser: 1.19 mg/dL — ABNORMAL HIGH (ref 0.44–1.00)
GFR calc non Af Amer: 55 mL/min — ABNORMAL LOW (ref >60)
Glucose, Bld: 365 mg/dL — ABNORMAL HIGH (ref 65–99)
Potassium: 4.7 mmol/L (ref 3.5–5.1)
Sodium: 132 mmol/L — ABNORMAL LOW (ref 135–145)

## 2016-10-30 LAB — I-STAT TROPONIN, ED
TROPONIN I, POC: 0 ng/mL (ref 0.00–0.08)
TROPONIN I, POC: 0 ng/mL (ref 0.00–0.08)

## 2016-10-30 LAB — TROPONIN I

## 2016-10-30 LAB — I-STAT BETA HCG BLOOD, ED (MC, WL, AP ONLY)

## 2016-10-30 LAB — GLUCOSE, CAPILLARY
Glucose-Capillary: 283 mg/dL — ABNORMAL HIGH (ref 65–99)
Glucose-Capillary: 353 mg/dL — ABNORMAL HIGH (ref 65–99)

## 2016-10-30 LAB — APTT: APTT: 84 s — AB (ref 24–36)

## 2016-10-30 LAB — HEPARIN LEVEL (UNFRACTIONATED): Heparin Unfractionated: 1.8 IU/mL — ABNORMAL HIGH (ref 0.30–0.70)

## 2016-10-30 MED ORDER — PROMETHAZINE HCL 25 MG PO TABS
25.0000 mg | ORAL_TABLET | Freq: Three times a day (TID) | ORAL | Status: DC | PRN
  Administered 2016-10-30 – 2016-10-31 (×2): 25 mg via ORAL
  Filled 2016-10-30 (×2): qty 1

## 2016-10-30 MED ORDER — PANTOPRAZOLE SODIUM 40 MG PO TBEC
40.0000 mg | DELAYED_RELEASE_TABLET | Freq: Every day | ORAL | Status: DC
  Administered 2016-10-30 – 2016-10-31 (×2): 40 mg via ORAL
  Filled 2016-10-30 (×3): qty 1

## 2016-10-30 MED ORDER — ACETAMINOPHEN 325 MG PO TABS: 650.0000 mg | ORAL_TABLET | Freq: Four times a day (QID) | ORAL | Status: DC | PRN

## 2016-10-30 MED ORDER — KETOROLAC TROMETHAMINE 30 MG/ML IJ SOLN
30.0000 mg | Freq: Four times a day (QID) | INTRAMUSCULAR | Status: DC | PRN
  Administered 2016-10-31: 30 mg via INTRAVENOUS
  Filled 2016-10-30: qty 1

## 2016-10-30 MED ORDER — OXYCODONE HCL 5 MG PO TABS: 10.0000 mg | ORAL_TABLET | Freq: Four times a day (QID) | ORAL | Status: DC | PRN

## 2016-10-30 MED ORDER — ALBUTEROL SULFATE HFA 108 (90 BASE) MCG/ACT IN AERS: 2.0000 | INHALATION_SPRAY | RESPIRATORY_TRACT | Status: DC | PRN

## 2016-10-30 MED ORDER — HYDROMORPHONE HCL 1 MG/ML IJ SOLN
1.0000 mg | Freq: Once | INTRAMUSCULAR | Status: AC
  Administered 2016-10-30: 1 mg via INTRAVENOUS
  Filled 2016-10-30: qty 1

## 2016-10-30 MED ORDER — FERROUS SULFATE 325 (65 FE) MG PO TABS
325.0000 mg | ORAL_TABLET | Freq: Two times a day (BID) | ORAL | Status: DC
  Administered 2016-10-30 – 2016-10-31 (×3): 325 mg via ORAL
  Filled 2016-10-30 (×3): qty 1

## 2016-10-30 MED ORDER — OXYCODONE HCL 5 MG PO TABS
10.0000 mg | ORAL_TABLET | ORAL | Status: DC | PRN
  Administered 2016-10-30 – 2016-10-31 (×3): 10 mg via ORAL
  Filled 2016-10-30 (×3): qty 2

## 2016-10-30 MED ORDER — INSULIN ASPART 100 UNIT/ML ~~LOC~~ SOLN
0.0000 [IU] | Freq: Three times a day (TID) | SUBCUTANEOUS | Status: DC
Start: 2016-10-30 — End: 2016-10-31
  Administered 2016-10-30: 8 [IU] via SUBCUTANEOUS
  Administered 2016-10-31: 5 [IU] via SUBCUTANEOUS
  Administered 2016-10-31: 8 [IU] via SUBCUTANEOUS
  Administered 2016-10-31: 11 [IU] via SUBCUTANEOUS

## 2016-10-30 MED ORDER — POLYETHYLENE GLYCOL 3350 17 G PO PACK: 17.0000 g | PACK | Freq: Every day | ORAL | Status: DC | PRN

## 2016-10-30 MED ORDER — HEPARIN BOLUS VIA INFUSION
4500.0000 [IU] | Freq: Once | INTRAVENOUS | Status: AC
  Administered 2016-10-30: 4500 [IU] via INTRAVENOUS
  Filled 2016-10-30: qty 4500

## 2016-10-30 MED ORDER — GI COCKTAIL ~~LOC~~
30.0000 mL | Freq: Once | ORAL | Status: AC
Start: 2016-10-30 — End: 2016-10-30
  Administered 2016-10-30: 30 mL via ORAL
  Filled 2016-10-30: qty 30

## 2016-10-30 MED ORDER — SODIUM CHLORIDE 0.9% FLUSH
3.0000 mL | Freq: Two times a day (BID) | INTRAVENOUS | Status: DC
  Administered 2016-10-30: 3 mL via INTRAVENOUS

## 2016-10-30 MED ORDER — LOSARTAN POTASSIUM 50 MG PO TABS
50.0000 mg | ORAL_TABLET | Freq: Every day | ORAL | Status: DC
  Administered 2016-10-30 – 2016-10-31 (×2): 50 mg via ORAL
  Filled 2016-10-30 (×2): qty 1

## 2016-10-30 MED ORDER — ACETAMINOPHEN 650 MG RE SUPP: 650.0000 mg | Freq: Four times a day (QID) | RECTAL | Status: DC | PRN

## 2016-10-30 MED ORDER — INSULIN ASPART 100 UNIT/ML ~~LOC~~ SOLN: 0.0000 [IU] | Freq: Every day | SUBCUTANEOUS | Status: DC

## 2016-10-30 MED ORDER — FENTANYL CITRATE (PF) 100 MCG/2ML IJ SOLN
50.0000 ug | Freq: Once | INTRAMUSCULAR | Status: AC
  Administered 2016-10-30: 50 ug via INTRAVENOUS
  Filled 2016-10-30: qty 2

## 2016-10-30 MED ORDER — INSULIN ASPART PROT & ASPART (70-30 MIX) 100 UNIT/ML ~~LOC~~ SUSP
15.0000 [IU] | Freq: Two times a day (BID) | SUBCUTANEOUS | Status: DC
  Administered 2016-10-30 – 2016-10-31 (×3): 15 [IU] via SUBCUTANEOUS
  Filled 2016-10-30: qty 10

## 2016-10-30 MED ORDER — ALBUTEROL SULFATE (2.5 MG/3ML) 0.083% IN NEBU: 2.5000 mg | INHALATION_SOLUTION | RESPIRATORY_TRACT | Status: DC | PRN

## 2016-10-30 MED ORDER — HEPARIN (PORCINE) IN NACL 100-0.45 UNIT/ML-% IJ SOLN
1450.0000 [IU]/h | INTRAMUSCULAR | Status: DC
  Administered 2016-10-30: 1450 [IU]/h via INTRAVENOUS
  Filled 2016-10-30: qty 250

## 2016-10-30 MED ORDER — HYDROMORPHONE HCL 1 MG/ML IJ SOLN
0.5000 mg | Freq: Once | INTRAMUSCULAR | Status: AC
  Administered 2016-10-30: 0.5 mg via INTRAVENOUS
  Filled 2016-10-30: qty 0.5

## 2016-10-30 MED ORDER — METOPROLOL TARTRATE 25 MG PO TABS
50.0000 mg | ORAL_TABLET | Freq: Two times a day (BID) | ORAL | Status: DC
  Administered 2016-10-30 – 2016-10-31 (×2): 50 mg via ORAL
  Filled 2016-10-30 (×2): qty 2

## 2016-10-30 MED ORDER — NITROGLYCERIN 0.4 MG SL SUBL
0.4000 mg | SUBLINGUAL_TABLET | SUBLINGUAL | Status: DC | PRN
  Administered 2016-10-30 – 2016-10-31 (×2): 0.4 mg via SUBLINGUAL
  Filled 2016-10-30 (×2): qty 1

## 2016-10-30 MED ORDER — ASPIRIN EC 81 MG PO TBEC
81.0000 mg | DELAYED_RELEASE_TABLET | Freq: Every day | ORAL | Status: DC
  Administered 2016-10-30 – 2016-10-31 (×2): 81 mg via ORAL
  Filled 2016-10-30 (×2): qty 1

## 2016-10-30 MED ORDER — ENOXAPARIN SODIUM 100 MG/ML ~~LOC~~ SOLN
1.0000 mg/kg | Freq: Two times a day (BID) | SUBCUTANEOUS | Status: DC
  Administered 2016-10-30 – 2016-10-31 (×2): 100 mg via SUBCUTANEOUS
  Filled 2016-10-30 (×2): qty 1

## 2016-10-30 MED ORDER — CLINDAMYCIN PHOSPHATE 1 % EX GEL
Freq: Two times a day (BID) | CUTANEOUS | Status: DC | PRN
  Filled 2016-10-30: qty 30

## 2016-10-30 MED ORDER — HEPARIN BOLUS VIA INFUSION: 4000.0000 [IU] | Freq: Once | INTRAVENOUS | Status: DC

## 2016-10-30 MED ORDER — IOPAMIDOL (ISOVUE-370) INJECTION 76%
INTRAVENOUS | Status: AC
  Administered 2016-10-30: 100 mL
  Filled 2016-10-30: qty 100

## 2016-10-30 MED ORDER — TRAMADOL HCL 50 MG PO TABS: 50.0000 mg | ORAL_TABLET | Freq: Four times a day (QID) | ORAL | Status: DC | PRN

## 2016-10-30 MED ORDER — SODIUM CHLORIDE 0.9 % IV BOLUS (SEPSIS)
1000.0000 mL | Freq: Once | INTRAVENOUS | Status: AC
  Administered 2016-10-30: 1000 mL via INTRAVENOUS

## 2016-10-30 MED ORDER — PRAVASTATIN SODIUM 40 MG PO TABS
20.0000 mg | ORAL_TABLET | Freq: Every day | ORAL | Status: DC
  Administered 2016-10-30 – 2016-10-31 (×2): 20 mg via ORAL
  Filled 2016-10-30 (×2): qty 1

## 2016-10-30 NOTE — H&P (Signed)
Date: 10/30/2016               Patient Name:  Shirley [name] MRN: [id number]  DOB: [birth year] Age / Sex: 41 y.o., female   PCP: Clent Demark, PA-C         Medical Service: Internal Medicine Teaching Service         Attending Physician: Dr. Bartholomew Crews, MD    First Contact: Dr. Trilby Drummer Pager: [phone]  Second Contact: Dr. Benjamine Mola Pager: [phone]       After Hours (After 5p/  First Contact Pager: [phone]  weekends / holidays): Second Contact Pager: [phone]   Chief Complaint: Chest pain and SOB  History of Present Illness:  This is 41 y.o. woman with PMHx significant for CAD s/p MI in 2008, iron deficiency anemia, HTN, DM complicated by gastroparesis, hx of CVA, fibromyalgia, and recently diagnosed right LE DVT on Xarelto who presents with chest pain and SOB.  Patient reports symptom onset 2 days ago. At that time she felt lightheaded, dizzy, and developed chest tightness.  Yesterday, she attempted to walk to the store from her house.  She again felt these symptoms as well as SOB, diaphoresis, nausea and vomiting.  She reported a [place] bit of blood in her emesis that is not unexpected per her report.  She denies any hemoptysis or coughing.  Her chest pain was described as stabbing and squeezing.  She reports it felt like her prior MI but not as severe. She gained only mild relief with rest and trying to rock herself to sleep.  She came in to the ED because her symptoms did not get better.  She states her symptoms are constant.  Her SOB makes her chest pain worse.  In the ED, she had a CTA chest that was positive for bilateral pulmonary emboli.  She was seen in the ED on July 3 and diagnosed with a urinary tract infection as well as a right lower extremity DVT.  She was discharged to home on Xarelto and Levaquin.  She reports taking these medications.  In particular, she reports BID dosing of Xarelto and that she takes with food.  She reports her only missed dose being last night  because she was vomiting and came to the ED.  Her UTI symptoms have improved to some degree but she still is experiencing frequency and pressure sensation at the end of urination.  She reports chronic abdominal pain.  Her urine culture from 7/3 has grown greater than 100,000 colonies of pan-sensitive E. Coli.  All of her other medications have otherwise been reported as taking within the past week.   Meds:  Current Meds  Medication Sig  . acetaminophen (TYLENOL) 500 MG tablet Take 500 mg by mouth every 6 (six) hours as needed for mild pain.   Marland Kitchen albuterol (PROVENTIL HFA;VENTOLIN HFA) 108 (90 Base) MCG/ACT inhaler Inhale 2 puffs into the lungs every 4 (four) hours as needed for wheezing or shortness of breath.  Marland Kitchen aspirin EC 81 MG tablet Take 1 tablet (81 mg total) by mouth daily.  . cephALEXin (KEFLEX) 500 MG capsule Take 2 capsules (1,000 mg total) by mouth 2 (two) times daily.  . clindamycin (CLINDAGEL) 1 % gel Apply topically 2 (two) times daily.  . ferrous sulfate 325 (65 FE) MG tablet Take 1 tablet (325 mg total) by mouth 2 (two) times daily with a meal.  . glucose blood (FREESTYLE TEST STRIPS) test strip Use as instructed  .  glucose monitoring kit (FREESTYLE) monitoring kit 1 each by Does not apply route 4 (four) times daily - after meals and at bedtime. 1 month Diabetic Testing Supplies for QAC-QHS accuchecks.  . hydrochlorothiazide (HYDRODIURIL) 25 MG tablet Take 1 tablet (25 mg total) by mouth daily. Take on tablet in the morning.  . insulin NPH-regular Human (NOVOLIN 70/30) (70-30) 100 UNIT/ML injection 25 units in the morning and 25 units in the evening.  . INSULIN SYRINGE .5CC/28G (INS SYRINGE/NEEDLE .5CC/28G) 28G X 1/2" 0.5 ML MISC Please provide 1 month supply  . Lancets (FREESTYLE) lancets Use as instructed  . losartan (COZAAR) 50 MG tablet Take 1 tablet (50 mg total) by mouth daily.  Marland Kitchen lovastatin (MEVACOR) 20 MG tablet Take 1 tablet (20 mg total) by mouth at bedtime.  . metoprolol  tartrate (LOPRESSOR) 50 MG tablet Take 1 tablet (50 mg total) by mouth 2 (two) times daily.  Marland Kitchen omeprazole (PRILOSEC) 40 MG capsule Take 1 capsule (40 mg total) by mouth daily.  . promethazine (PHENERGAN) 25 MG tablet Take 1 tablet (25 mg total) by mouth every 8 (eight) hours as needed for nausea or vomiting.  . Rivaroxaban 15 & 20 MG TBPK Take as directed on package: Start with one 42m tablet by mouth twice a day with food. On Day 22, switch to one 267mtablet once a day with food.     Allergies: Allergies as of 10/29/2016 - Review Complete 10/29/2016  Allergen Reaction Noted  . Lisinopril Nausea Only and Swelling 03/17/2008  . Morphine and related Itching and Swelling 04/15/2013   Past Medical History:  Diagnosis Date  . Abscess of tunica vaginalis    10/09- Abundant S. aureus- sensitive to all abx  . Anxiety   . Blood dyscrasia   . CAD (coronary artery disease) 06/15/2006   s/p Subendocardial MI with PDA angioplasty(no stent) on 06/15/06 and relook  cath 06/19/06 showed patency of site. Cath 12/10- no restenosis or significant CAD progression  . [place]   denies residual on 04/22/2014  . [facility]   history of remote right cerebellar infarct noted on head CT at least since 10/2011  . Depression   . Diabetes mellitus type 2, uncontrolled, [place]  . Fibromyalgia   . Gastritis   . Gastroparesis    secondary to poorly controlled DM, last emptying study performed 01/2010  was normal but may be falsely positive as pt was on reglan  . GERD (gastroesophageal reflux disease)   . Hepatitis B, chronic (HCC)    Hep BeAb+,Hep B cAb+ & Hep BsAg+ (9/06)  . History of pyelonephritis    H/o GrpB Pyelonephritis (9/06) and UTI- 07/11- E.Coli, 12/10- GBS  . Hyperlipidemia   . Hypertension   . Iron deficiency anemia   . Irregular menses    Small ovarian follicles seen on CTLA(4/53 . [place]   PDA percutaneous transluminal coronary angioplasty  . Migraine    "weekly" (04/22/2014)  . N&V (nausea and vomiting)    Chronic. Unclear etiology with multiple admission and ED visits. CT abdomen with and without contrast (02/2011)  showed no acute process. Gastic Emptying scan (01/2010) was normal. Ultrasound of the abdomen was within normal limits. Hepatitis B viral load was undectable. HIV NR. EGD - gastritis, Hpylori + s/p Rx  . Obesity   . OSA (obstructive sleep apnea)    "suppose to wear mask but I don't" (  04/22/2014)  . Peripheral neuropathy   . Pneumonia    "this is probably the 2nd or 3rd time I've had pneumonia" (04/22/2014)  . Recurrent boils   . Seasonal asthma   . Substance abuse   . Thrombocytosis (HCC)    Hem/Onc suggested 2/2 chronic hepatits and/or iron deficiency anemia    Family History: mother with DM, Grandfather with MI  Social History:  Denies tobacco, social drinker, remote history of drug use, has a boyfriend  Review of Systems: A complete ROS was negative except as per HPI.   Physical Exam: Blood pressure (!) 159/99, pulse 99, temperature 98.3 F (36.8 C), temperature source Oral, resp. rate 18, height 5' 7"  (1.702 m), weight 218 lb (98.9 kg), SpO2 93 %. Physical Exam  Constitutional: She is oriented to person, place, and time and well-developed, well-nourished, and in no distress.  Tearful on exam.  HENT:  Head: Normocephalic and atraumatic.  Eyes: Conjunctivae and EOM are normal.  Neck: Normal range of motion. Neck supple.  Cardiovascular: Regular rhythm, normal heart sounds and intact distal pulses.   No murmur heard. Tachycardic rate.  Pulmonary/Chest: Effort normal and breath sounds normal. No respiratory distress. She has no wheezes.  Abdominal: Soft. Bowel sounds are normal. She exhibits no distension. There is tenderness. There is no rebound and no guarding.  She has mild tenderness in her LLQ  Musculoskeletal:  Normal range of motion. She exhibits edema and tenderness.  She has trace pitting edema of her Right LE, with tenderness of her posterior calf.  (+) Homans on Right.  Neurological: She is alert and oriented to person, place, and time. No cranial nerve deficit.  Skin: Skin is warm and dry. No erythema.  Psychiatric: Mood and affect normal.     EKG: personally reviewed my interpretation is HR 110, Sinus tachycardia, nonspecific T wave abnormality  CXR: personally reviewed my interpretation is no acute process.  CTA Chest:  IMPRESSION: 1. Study is positive for bilateral pulmonary emboli. No evidence for right heart strain. 2. Atelectasis versus infarct the lingula. 3. Bronchiectasis and scarring in the left upper lobe.  Labs:  Na 132, K 4.7, Cl 101, CO2 22, BUN 15, Cr 1.19, Gluc 365, anion gap 9 WBC 11.3, Hgb 9.4, HCT 29, Plt 425, MCV 67 Troponin negative x 2  Assessment & Plan by Problem:  Bilateral PE Right DVT Patient seen in the ED on July 3 and diagnosed with new right LE DVT.  She denies recent history of trauma, surgery, prolonged immobility, recent travel.  She has no known malignancy.  She is obese and is on Depo-Provera for contraception.  CTA chest this morning with bilateral pulmonary emboli, but fortunately no evidence of right heart strain.  This is in the setting of 2 days of SOB and chest tightness.  Her pharmacy ([facility]) reports that she did pick up her prescription but not until July 5 so she went a day without appropriate anticoagulation and symptoms then started the next day.  She otherwise reports adherence.  As such, I am unsure if this truly represents Xarelto failure given the delay in starting therapy. - DC Heparin gtt - Hold Xarelto for now. - Start Lovenox 49m/kg.  Consult pharmacy for assistance. - Ambulate on pulse oximetry - Tyleonol for mild pain - Tramadol for moderate pain  UTI Urine culture from 7/3 with greater than 100,000  colonies of pan-sensitive E. Coli.  She has been taking Levaquin and reports having 2 pills left.  Her ongoing symptoms of frequency may be secondary to osmotic diuresis from hyperglycemia.  Her pharmacy reports she picked up this medication on 7/5 so she took for 3 days and may be adequately treated.  She is afebrile and her WBC is chronically elevated - Repeat UA and culture (if needed) - If UA suggestive of UTI, can probably treat with Keflex based on prior cultures.  CAD s/p MI HTN HLD No evidence of active myocardial infarction with non-ischemic EKG and negative troponins x 2.  Hypertensive in the ED at 159/99. - Continue ASA 11m daily  - Continue Losartan 579mdaily - Continue Metoprolol tartrate 2571mID - Hold HCTZ 24m3mily.  Resume as needed.  DM type 2 Gastroparesis Reports now being on Novolog 25 units BID although medication list reports Novolin 70/30.  Glucose greater than 300 on admission.  Last A1c 8.6 in May 2018. - Sliding Scale with HS coverage - Novolin 70/30 15 units BID - Phenergan 24mg40mQ8H as needed  Hx of Gastritis - Protonix 40mg 31my  Iron Deficiency Anemia Iron of 20, ferritin 5, saturation ratio of 4 in April 2018. - Continue ferrous sulfate 324mg B64mFEN Fluids: None Electrolytes: Replace as needed Nutrition: Heart Healthy  DVT PPx:  Lovenox as above  CODE: FULL  Dispo: Admit patient to Observation with expected length of stay less than 2 midnights.  Signed: [place] Ser9/2018, 11:43 AM  Pager: [phone]

## 2016-10-30 NOTE — Progress Notes (Signed)
ANTICOAGULATION CONSULT NOTE - Initial Consult  Pharmacy Consult for heparin Indication: pulmonary embolus  Allergies  Allergen Reactions  . Lisinopril Nausea Only and Swelling  . Morphine And Related Itching and Swelling    Patient Measurements: Height: 5\' 7"  (170.2 cm) Weight: 218 lb (98.9 kg) IBW/kg (Calculated) : 61.6 Heparin Dosing Weight: 83.6 kg  Vital Signs: Temp: 98.3 F (36.8 C) (07/09 0006) Temp Source: Oral (07/09 0006) BP: 154/92 (07/09 0748) Pulse Rate: 106 (07/09 0748)  Labs:  Recent Labs  10/30/16 0013  HGB 9.4*  HCT 29.0*  PLT 425*  CREATININE 1.19*    Estimated Creatinine Clearance: 73.6 mL/min (A) (by C-G formula based on SCr of 1.19 mg/dL (H)).   Medical History: Past Medical History:  Diagnosis Date  . Abscess of tunica vaginalis    10/09- Abundant S. aureus- sensitive to all abx  . Anxiety   . Blood dyscrasia   . CAD (coronary artery disease) 06/15/2006   s/p Subendocardial MI with PDA angioplasty(no stent) on 06/15/06 and relook  cath 06/19/06 showed patency of site. Cath 12/10- no restenosis or significant CAD progression  . CVA (cerebral vascular accident) ([place]) ~ 02/2014   denies residual on 04/22/2014  . CVA (cerebral vascular accident) [facility])    history of remote right cerebellar infarct noted on head CT at least since 10/2011  . Depression   . Diabetes mellitus type 2, uncontrolled, with complications ([place])   . Fibromyalgia   . Gastritis   . Gastroparesis    secondary to poorly controlled DM, last emptying study performed 01/2010  was normal but may be falsely positive as pt was on reglan  . GERD (gastroesophageal reflux disease)   . Hepatitis B, chronic (HCC)    Hep BeAb+,Hep B cAb+ & Hep BsAg+ (9/06)  . History of pyelonephritis    H/o GrpB Pyelonephritis (9/06) and UTI- 07/11- E.Coli, 12/10- GBS  . Hyperlipidemia   . Hypertension   . Iron deficiency anemia   . Irregular menses    Small ovarian follicles seen on AC(1/66)  .  MI (myocardial infarction) ([place]) 05/2006   PDA percutaneous transluminal coronary angioplasty  . Migraine    "weekly" (04/22/2014)  . N&V (nausea and vomiting)    Chronic. Unclear etiology with multiple admission and ED visits. CT abdomen with and without contrast (02/2011)  showed no acute process. Gastic Emptying scan (01/2010) was normal. Ultrasound of the abdomen was within normal limits. Hepatitis B viral load was undectable. HIV NR. EGD - gastritis, Hpylori + s/p Rx  . Obesity   . OSA (obstructive sleep apnea)    "suppose to wear mask but I don't" (04/22/2014)  . Peripheral neuropathy   . Pneumonia    "this is probably the 2nd or 3rd time I've had pneumonia" (04/22/2014)  . Recurrent boils   . Seasonal asthma   . Substance abuse   . Thrombocytosis (HCC)    Hem/Onc suggested 2/2 chronic hepatits and/or iron deficiency anemia   Assessment: Shirley [name] is a 41 y/o F who presented with SOB and CP. Pharmacy is consulted to dose heparin for new bilateral PE (no right heart strain). On 7/03 she was diagnosed with a DVT in the RLE and started on Xarelto-last dose 7/8 at 1430 PTA. Hemoglobin 9.4, PLTs 425, no bleeding documented.  Due to new clot burden, will start heparin now. Will give smaller bolus due to recent Xarelto dose.  Goal of Therapy:  Heparin level 0.3-0.7 units/ml aPTT 66-102 seconds Monitor platelets by  anticoagulation protocol: Yes   Plan:  Baseline aPTT/heparin level Give 4500 units bolus x 1 Start heparin infusion at 1450 units/hr Follow 6 hour aPTT Daily heparin level, aPTT, and CBC Continue to monitor H&H and platelets  Monitor for S/Sx for bleeding   Patterson Hammersmith PharmD PGY1 Pharmacy Practice Resident 10/30/2016 10:32 AM Pager: [phone]

## 2016-10-30 NOTE — Progress Notes (Signed)
Pt c/o mid chest pain 10/10 described as squeezing and stubbing, and some SOB, VS stable, Oxy every 4 hours given at 1806. MD on call notified.

## 2016-10-30 NOTE — Progress Notes (Signed)
MD requested EKG to be repeated, pt refused until she finish her dinner. MD aware

## 2016-10-30 NOTE — ED Notes (Signed)
IM paged about PRN pain medication order.

## 2016-10-30 NOTE — Discharge Planning (Signed)
Pt current PCP is Domenica Fail, PA at [facility].  Her last appointment was 09/05/16.

## 2016-10-30 NOTE — ED Notes (Signed)
Awaiting heparin from pharmacy.

## 2016-10-30 NOTE — ED Notes (Addendum)
Pt ambulated to RR and back O2 98 pulse 120 while ambulating. Pt felt a Hajduk short of breath when ambulating to RR and back to room

## 2016-10-30 NOTE — ED Notes (Signed)
PT did not have to use RR at this time  

## 2016-10-30 NOTE — ED Provider Notes (Signed)
[name] Provider Note   CSN: [id number] Arrival date & time: 10/29/16  2335     History   Chief Complaint Chief Complaint  Patient presents with  . Chest Pain    HPI Shirley [name] is a 41 y.o. female.  The history is provided by the patient and medical records. No language interpreter was used.  Chest Pain   Associated symptoms include cough, nausea, shortness of breath, vomiting and weakness. Pertinent negatives include no abdominal pain, no dizziness, no fever, no headaches, no numbness and no palpitations.   Shirley [name] is a 41 y.o. female  with a PMH of CAD, prior CVA, DM with gastroparesis, fibromyalgia, GERD, HTN, HLD who presents to the Emergency Department complaining of acute onset of shortness of breath and tight chest pain sensation that began while walking into the grocery store last night. Patient states that chest pain has been constant, central with no alleviating or aggravating factors since it began last night. Shortness of breath gets worse with ambulation and better with rest, however she still does feel as if it is more difficult to catch her breath than usual. At the immediate onset of symptoms, she felt lightheaded and as if she was going to pass out, however did not have syncopal event. She also endorses associated nausea and 2 episodes of vomiting, however states this is more likely due to her gastroparesis acting up. She notes her blood sugars have been running in the 300s which is typical for her. She was diagnosed with a DVT in the right lower extremity on 7/03 (6 days ago). She was discharged with his relative and has been compliant with medication. She denies any missed doses. She still feels like the leg is swollen, tight and very painful. She has taken no other medications prior to arrival for her symptoms. She has had cough productive of mucus for the last 2-3 days. No fever or chills.   Past Medical History:  Diagnosis Date  . Abscess  of tunica vaginalis    10/09- Abundant S. aureus- sensitive to all abx  . Anxiety   . Blood dyscrasia   . CAD (coronary artery disease) 06/15/2006   s/p Subendocardial MI with PDA angioplasty(no stent) on 06/15/06 and relook  cath 06/19/06 showed patency of site. Cath 12/10- no restenosis or significant CAD progression  . CVA (cerebral vascular accident) ([place]) ~ 02/2014   denies residual on 04/22/2014  . CVA (cerebral vascular accident) [facility])    history of remote right cerebellar infarct noted on head CT at least since 10/2011  . Depression   . Diabetes mellitus type 2, uncontrolled, with complications ([place])   . Fibromyalgia   . Gastritis   . Gastroparesis    secondary to poorly controlled DM, last emptying study performed 01/2010  was normal but may be falsely positive as pt was on reglan  . GERD (gastroesophageal reflux disease)   . Hepatitis B, chronic (HCC)    Hep BeAb+,Hep B cAb+ & Hep BsAg+ (9/06)  . History of pyelonephritis    H/o GrpB Pyelonephritis (9/06) and UTI- 07/11- E.Coli, 12/10- GBS  . Hyperlipidemia   . Hypertension   . Iron deficiency anemia   . Irregular menses    Small ovarian follicles seen on PI(9/51)  . MI (myocardial infarction) ([place]) 05/2006   PDA percutaneous transluminal coronary angioplasty  . Migraine    "weekly" (04/22/2014)  . N&V (nausea and vomiting)    Chronic. Unclear etiology with multiple admission  and ED visits. CT abdomen with and without contrast (02/2011)  showed no acute process. Gastic Emptying scan (01/2010) was normal. Ultrasound of the abdomen was within normal limits. Hepatitis B viral load was undectable. HIV NR. EGD - gastritis, Hpylori + s/p Rx  . Obesity   . OSA (obstructive sleep apnea)    "suppose to wear mask but I don't" (04/22/2014)  . Peripheral neuropathy   . Pneumonia    "this is probably the 2nd or 3rd time I've had pneumonia" (04/22/2014)  . Recurrent boils   . Seasonal asthma   . Substance abuse   . Thrombocytosis  ([place])    Hem/Onc suggested 2/2 chronic hepatits and/or iron deficiency anemia    Patient Active Problem List   Diagnosis Date Noted  . Nausea and vomiting 09/23/2016  . Intractable nausea and vomiting 08/11/2016  . Type 2 diabetes mellitus with hyperglycemia ([place]) 04/07/2016  . Nausea & vomiting 04/07/2016  . Diabetic gastroparesis ([place]) 04/06/2016  . Non-intractable cyclical vomiting with nausea   . Dehydration 03/27/2015  . Intractable vomiting with nausea 11/11/2014  . Gastroparesis 11/11/2014  . Cough   . Hematemesis 10/13/2014  . DKA (diabetic ketoacidoses) ([place]) 10/13/2014  . Increased anion gap metabolic acidosis 16/01/9603  . Tachycardia 07/21/2014  . Abdominal pain, chronic, left lower quadrant   . Nausea with vomiting   . Diabetic gastroparesis associated with type 2 diabetes mellitus ([place]) 04/28/2014  . History of Helicobacter pylori infection 04/22/2014  . Vaginal discharge 02/18/2014  . Atypical chest pain 07/22/2013  . Unspecified constipation 07/21/2013  . Tinea corporis 07/21/2013  . Intractable vomiting 04/29/2013  . Dysmenorrhea 04/22/2013  . UTI (urinary tract infection) 07/15/2012  . Headache(784.0) 02/08/2012  . Health care maintenance 01/22/2012  . Chronic hepatitis B ([place]) 03/07/2011  . History of leukocytosis 04/06/2010  . THROMBOCYTOSIS 04/06/2010  . Polysubstance abuse 02/23/2010  . Iron deficiency anemia 11/22/2009  . PERIPHERAL NEUROPATHY 10/01/2009  . Hyperlipidemia 08/30/2009  . Diabetic polyneuropathy ([place]) 08/30/2009  . Hidradenitis (recurrent boils) 07/07/2008  . Depression 12/27/2007  . Abdominal pain, left lower quadrant 11/21/2007  . FIBROMYALGIA 10/30/2007  . BACK PAIN 04/01/2007  . OBSTRUCTIVE SLEEP APNEA 01/17/2007  . ANXIETY DEPRESSION 06/27/2006  . Chronic ischemic heart disease 06/15/2006  . OBESITY, MORBID 05/15/2006  . MIGRAINE HEADACHE 05/15/2006  . Asthma 05/15/2006  . Essential hypertension 01/16/2006  . IRREGULAR  MENSTRUATION 01/16/2006  . PEDAL EDEMA 01/16/2006  . Poorly controlled type 2 diabetes mellitus with peripheral neuropathy ([place]) 01/16/1989    Past Surgical History:  Procedure Laterality Date  . CESAREAN SECTION  1997  . CORONARY ANGIOPLASTY WITH STENT PLACEMENT  2008   "2 stents"  . ESOPHAGOGASTRODUODENOSCOPY N/A 04/23/2014   Procedure: ESOPHAGOGASTRODUODENOSCOPY (EGD);  Surgeon: Winfield Cunas., MD;  Location: [facility] ENDOSCOPY;  Service: Endoscopy;  Laterality: N/A;    OB History    No data available       Home Medications    Prior to Admission medications   Medication Sig Start Date End Date Taking? Authorizing Provider  acetaminophen (TYLENOL) 500 MG tablet Take 500 mg by mouth every 6 (six) hours as needed for mild pain.    Yes [provider]  albuterol (PROVENTIL HFA;VENTOLIN HFA) 108 (90 Base) MCG/ACT inhaler Inhale 2 puffs into the lungs every 4 (four) hours as needed for wheezing or shortness of breath. 09/27/16  Yes Clent Demark, PA-C  aspirin EC 81 MG tablet Take 1 tablet (81 mg total) by mouth daily.  09/27/16  Yes Clent Demark, PA-C  cephALEXin (KEFLEX) 500 MG capsule Take 2 capsules (1,000 mg total) by mouth 2 (two) times daily. 10/18/16  Yes Lawyer, Harrell Gave, PA-C  clindamycin (CLINDAGEL) 1 % gel Apply topically 2 (two) times daily. 09/27/16  Yes Clent Demark, PA-C  ferrous sulfate 325 (65 FE) MG tablet Take 1 tablet (325 mg total) by mouth 2 (two) times daily with a meal. 09/05/16  Yes Clent Demark, PA-C  glucose blood (FREESTYLE TEST STRIPS) test strip Use as instructed 09/05/16  Yes Clent Demark, PA-C  glucose monitoring kit (FREESTYLE) monitoring kit 1 each by Does not apply route 4 (four) times daily - after meals and at bedtime. 1 month Diabetic Testing Supplies for QAC-QHS accuchecks. 09/05/16  Yes Clent Demark, PA-C  hydrochlorothiazide (HYDRODIURIL) 25 MG tablet Take 1 tablet (25 mg total) by mouth daily. Take on tablet in  the morning. 09/27/16  Yes Clent Demark, PA-C  insulin NPH-regular Human (NOVOLIN 70/30) (70-30) 100 UNIT/ML injection 25 units in the morning and 25 units in the evening. 09/27/16  Yes Clent Demark, PA-C  INSULIN SYRINGE .5CC/28G (INS SYRINGE/NEEDLE .5CC/28G) 28G X 1/2" 0.5 ML MISC Please provide 1 month supply 09/27/16  Yes Clent Demark, PA-C  Lancets (FREESTYLE) lancets Use as instructed 09/27/16  Yes Clent Demark, PA-C  losartan (COZAAR) 50 MG tablet Take 1 tablet (50 mg total) by mouth daily. 09/27/16  Yes Clent Demark, PA-C  lovastatin (MEVACOR) 20 MG tablet Take 1 tablet (20 mg total) by mouth at bedtime. 09/27/16  Yes Clent Demark, PA-C  metoprolol tartrate (LOPRESSOR) 50 MG tablet Take 1 tablet (50 mg total) by mouth 2 (two) times daily. 09/27/16  Yes Clent Demark, PA-C  omeprazole (PRILOSEC) 40 MG capsule Take 1 capsule (40 mg total) by mouth daily. 09/27/16  Yes Clent Demark, PA-C  promethazine (PHENERGAN) 25 MG tablet Take 1 tablet (25 mg total) by mouth every 8 (eight) hours as needed for nausea or vomiting. 10/18/16  Yes Lawyer, Harrell Gave, PA-C  Rivaroxaban 15 & 20 MG TBPK Take as directed on package: Start with one 78m tablet by mouth twice a day with food. On Day 22, switch to one 261mtablet once a day with food. 10/24/16  Yes MoShary DecampPA-C  cloNIDine (CATAPRES) 0.1 MG tablet Take 1 tablet (0.1 mg total) by mouth daily as needed. Take 1 tablet if bp above 18502ystolic 08/28/72/12/8/7/86SaNita SellsMD  insulin aspart (NOVOLOG) 100 UNIT/ML injection Inject 10 Units into the skin once. 09/27/16 09/27/16  GoClent DemarkPA-C  metoCLOPramide (REGLAN) 10 MG tablet Take 1 tablet (10 mg total) by mouth 4 (four) times daily -  before meals and at bedtime. Patient not taking: Reported on 10/24/2016 09/27/16   GoClent DemarkPA-C    Family History Family History  Problem Relation Age of Onset  . Diabetes Father     Social History Social  History  Substance Use Topics  . Smoking status: Former Smoker    Types: Cigarettes    Quit date: 04/24/1996  . Smokeless tobacco: Never Used     Comment: quit smoking cigarettes age 41. Alcohol use 0.0 oz/week     Comment: 04/22/2014 "might have a few drinks a month"     Allergies   Lisinopril and Morphine and related   Review of Systems Review of Systems  Constitutional: Negative for fever.  HENT: Positive for  congestion.   Respiratory: Positive for cough and shortness of breath. Negative for wheezing.   Cardiovascular: Positive for chest pain and leg swelling. Negative for palpitations.  Gastrointestinal: Positive for nausea and vomiting. Negative for abdominal pain, blood in stool, constipation and diarrhea.  Neurological: Positive for weakness. Negative for dizziness, syncope, speech difficulty, numbness and headaches.  All other systems reviewed and are negative.    Physical Exam Updated Vital Signs BP (!) 159/99   Pulse 99   Temp 98.3 F (36.8 C) (Oral)   Resp 18   Ht 5' 7"  (1.702 m)   Wt 98.9 kg (218 lb)   SpO2 93%   BMI 34.14 kg/m   Physical Exam  Constitutional: She is oriented to person, place, and time. She appears well-developed and well-nourished. No distress.  HENT:  Head: Normocephalic and atraumatic.  Cardiovascular: Normal rate, regular rhythm and normal heart sounds.   No murmur heard. Mildly tachycardic but regular.  Pulmonary/Chest: Effort normal and breath sounds normal. No respiratory distress. She has no wheezes. She has no rales.  Speaking in full sentences without difficulty.  Abdominal: Soft. She exhibits no distension. There is no tenderness.  Musculoskeletal:  RLE slightly swollen compared to left. Tenderness to palpation of right posterior calf and behind knee. No erythema, warmth or open wounds.  Neurological: She is alert and oriented to person, place, and time.  Skin: Skin is warm and dry.  Nursing note and vitals  reviewed.    ED Treatments / Results  Labs (all labs ordered are listed, but only abnormal results are displayed) Labs Reviewed  BASIC METABOLIC PANEL - Abnormal; Notable for the following:       Result Value   Sodium 132 (*)    Glucose, Bld 365 (*)    Creatinine, Ser 1.19 (*)    GFR calc non Af Amer 55 (*)    All other components within normal limits  CBC - Abnormal; Notable for the following:    WBC 11.3 (*)    Hemoglobin 9.4 (*)    HCT 29.0 (*)    MCV 67.4 (*)    MCH 21.9 (*)    RDW 20.1 (*)    Platelets 425 (*)    All other components within normal limits  I-STAT TROPOININ, ED  I-STAT TROPOININ, ED  I-STAT BETA HCG BLOOD, ED ([facility], [facility], [facility] ONLY)    EKG  EKG Interpretation  Date/Time:  Sunday October 29 2016 23:37:09 EDT Ventricular Rate:  110 PR Interval:  112 QRS Duration: 84 QT Interval:  332 QTC Calculation: 449 R Axis:   74 Text Interpretation:  Sinus tachycardia Nonspecific T wave abnormality Abnormal ECG No significant change since last tracing Confirmed by Pryor Curia [id number]) on 10/30/2016 5:50:30 AM Also confirmed by Pryor Curia [id number]), editor Hattie Perch ([id number])  on 10/30/2016 7:01:08 AM       Radiology Dg Chest 2 View  Result Date: 10/30/2016 CLINICAL DATA:  Chest pain and shortness of breath EXAM: CHEST  2 VIEW COMPARISON:  09/09/2016 FINDINGS: The heart size and mediastinal contours are within normal limits. Both lungs are clear. The visualized skeletal structures are unremarkable. IMPRESSION: No active cardiopulmonary disease. Electronically Signed   By: Donavan Foil M.D.   On: 10/30/2016 01:11   Ct Angio Chest Pe W And/or Wo Contrast  Result Date: 10/30/2016 CLINICAL DATA:  Sob, chest pain, cough, recent DVT in right leg on 10/24/16. 100 ml of isovue 370 given EXAM: CT ANGIOGRAPHY CHEST WITH CONTRAST TECHNIQUE: Multidetector  CT imaging of the chest was performed using the standard protocol during bolus administration of intravenous contrast.  Multiplanar CT image reconstructions and MIPs were obtained to evaluate the vascular anatomy. CONTRAST:  100 cc Isovue 370 COMPARISON:  Chest x-ray 10/30/2016 FINDINGS: Cardiovascular: There is significant clot within the pulmonary arteries bilaterally. Emboli are identified with right upper lobe, right middle lobe, and right lower lobe branches of the pulmonary artery. Clot is identified within the left upper lobe and left lower lobe branches of the pulmonary artery. Normal RV: LV ratio. Heart is normal in appearance. No pericardial effusion. No significant atherosclerosis of the thoracic aorta. No aneurysm. Mediastinum/Nodes: The visualized portion of the thyroid gland has a normal appearance. No mediastinal, hilar, or axillary adenopathy. Esophagus is normal in appearance. Lungs/Pleura: Minimal atelectasis versus infarction in the lingula. No pleural effusion. There is cylindrical bronchiectasis associated scarring in the left upper lobe. No suspicious pulmonary nodules. Upper Abdomen: Gallbladder is present. Upper abdomen is unremarkable. Musculoskeletal: No chest wall abnormality. No acute or significant osseous findings. Review of the MIP images confirms the above findings. IMPRESSION: 1. Study is positive for bilateral pulmonary emboli. No evidence for right heart strain. 2. Atelectasis versus infarct the lingula. 3. Bronchiectasis and scarring in the left upper lobe. Critical Value/emergent results were called by telephone at the time of interpretation on 10/30/2016 at 9:55 am to [facility], PA, who verbally acknowledged these results. Electronically Signed   By: Nolon Nations M.D.   On: 10/30/2016 09:55    Procedures Procedures (including critical care time)  CRITICAL CARE Performed by: Ozella Almond Geraldo Haris  Total critical care time: 40 minutes  Critical care time was exclusive of separately billable procedures and treating other patients.  Critical care was necessary to treat or prevent  imminent or life-threatening deterioration.  Critical care was time spent personally by me on the following activities: development of treatment plan with patient and/or surrogate as well as nursing, discussions with consultants, evaluation of patient's response to treatment, examination of patient, obtaining history from patient or surrogate, ordering and performing treatments and interventions, ordering and review of laboratory studies, ordering and review of radiographic studies, pulse oximetry and re-evaluation of patient's condition.   Medications Ordered in ED Medications  heparin bolus via infusion 4,500 Units (not administered)  heparin ADULT infusion 100 units/mL (25000 units/234m sodium chloride 0.45%) (not administered)  sodium chloride 0.9 % bolus 1,000 mL (0 mLs Intravenous Stopped 10/30/16 0748)  fentaNYL (SUBLIMAZE) injection 50 mcg (50 mcg Intravenous Given 10/30/16 0656)  HYDROmorphone (DILAUDID) injection 0.5 mg (0.5 mg Intravenous Given 10/30/16 0753)  iopamidol (ISOVUE-370) 76 % injection (100 mLs  Contrast Given 10/30/16 0910)  HYDROmorphone (DILAUDID) injection 1 mg (1 mg Intravenous Given 10/30/16 1021)     Initial Impression / Assessment and Plan / ED Course  I have reviewed the triage vital signs and the nursing notes.  Pertinent labs & imaging results that were available during my care of the patient were reviewed by me and considered in my medical decision making (see chart for details).    TCloma RahrigLittle is a 41y.o. female who presents to ED for central chest pain and shortness of breath which began last night. Of note, patient was diagnosed with right DVT on 7/03 and started on Xarelto. She endorses compliance with this medication and has not missed any doses. On exam, she is mildly tachycardic with heart rate of 100-110 on cardiac monitoring. No hypoxia. EKG with no significant change from  previous. Troponin negative 2. CT angiogram obtained showing bilateral pulmonary  emboli with no evidence of heart strain. Heparin initiated. Teaching service consulted who will admit.  Final Clinical Impressions(s) / ED Diagnoses   Final diagnoses:  Bilateral pulmonary embolism [facility])    New Prescriptions New Prescriptions   No medications on file     Kaveri Perras, Ozella Almond, PA-C 10/30/16 [place], Delice Bison, DO 10/31/16 [phone]

## 2016-10-30 NOTE — Progress Notes (Signed)
ANTICOAGULATION CONSULT NOTE - Initial Consult  Pharmacy Consult for heparin > lovenox Indication: pulmonary embolus  Allergies  Allergen Reactions  . Lisinopril Nausea Only and Swelling  . Morphine And Related Itching and Swelling    Patient Measurements: Height: 5\' 7"  (170.2 cm) Weight: 212 lb (96.2 kg) IBW/kg (Calculated) : 61.6 Heparin Dosing Weight: 83.6 kg  Vital Signs: Temp: 97.9 F (36.6 C) (07/09 1636) Temp Source: Oral (07/09 1636) BP: 129/93 (07/09 1636) Pulse Rate: 96 (07/09 1636)  Labs:  Recent Labs  10/30/16 0013 10/30/16 1047 10/30/16 1100  HGB 9.4*  --   --   HCT 29.0*  --   --   PLT 425*  --   --   APTT  --  84*  --   HEPARINUNFRC  --   --  1.80*  CREATININE 1.19*  --   --     Estimated Creatinine Clearance: 72.6 mL/min (A) (by C-G formula based on SCr of 1.19 mg/dL (H)).  Assessment: Shirley [name] is a 41 y/o F who presented with new PE, she was diagnosed with a DVT in the RLE and started on Xarelto-last dose 7/8 at 1430 PTA. She was started on IV heparin earlier today, now to transition heparin to lovenox. Scr 1.19, est. crcl ~ 70 ml/min.  Goal of Therapy:  Monitor platelets by anticoagulation protocol: Yes   Plan:  Stop heparin Start lovenox 100 mg sq Q 12 hrs, first dose 1 hr after heparin is turned off (floor RN notified) Continue to monitor H&H and platelets  Monitor for S/Sx for bleeding F/u plans for long-term anticoagulation  Maryanna Shape, PharmD, BCPS  Clinical Pharmacist  Pager: [phone]

## 2016-10-30 NOTE — ED Notes (Signed)
Pt went to bathroom at CT will try back later

## 2016-10-30 NOTE — Progress Notes (Signed)
First nitro given  EKG completed, MD notified.

## 2016-10-30 NOTE — Progress Notes (Addendum)
Pt stated her chest pain is getting better after nitro x 1 was given. She refused second nitro due to headache. MD notified

## 2016-10-31 DIAGNOSIS — I82409 Acute embolism and thrombosis of unspecified deep veins of unspecified lower extremity: Secondary | ICD-10-CM

## 2016-10-31 DIAGNOSIS — E1165 Type 2 diabetes mellitus with hyperglycemia: Secondary | ICD-10-CM

## 2016-10-31 DIAGNOSIS — Z794 Long term (current) use of insulin: Secondary | ICD-10-CM

## 2016-10-31 DIAGNOSIS — N390 Urinary tract infection, site not specified: Secondary | ICD-10-CM

## 2016-10-31 DIAGNOSIS — D509 Iron deficiency anemia, unspecified: Secondary | ICD-10-CM

## 2016-10-31 DIAGNOSIS — I10 Essential (primary) hypertension: Secondary | ICD-10-CM

## 2016-10-31 DIAGNOSIS — E1142 Type 2 diabetes mellitus with diabetic polyneuropathy: Secondary | ICD-10-CM

## 2016-10-31 DIAGNOSIS — I2510 Atherosclerotic heart disease of native coronary artery without angina pectoris: Secondary | ICD-10-CM

## 2016-10-31 DIAGNOSIS — E785 Hyperlipidemia, unspecified: Secondary | ICD-10-CM

## 2016-10-31 DIAGNOSIS — Z7982 Long term (current) use of aspirin: Secondary | ICD-10-CM

## 2016-10-31 DIAGNOSIS — I2699 Other pulmonary embolism without acute cor pulmonale: Secondary | ICD-10-CM

## 2016-10-31 DIAGNOSIS — Z885 Allergy status to narcotic agent status: Secondary | ICD-10-CM

## 2016-10-31 DIAGNOSIS — Z79899 Other long term (current) drug therapy: Secondary | ICD-10-CM

## 2016-10-31 DIAGNOSIS — Z888 Allergy status to other drugs, medicaments and biological substances status: Secondary | ICD-10-CM

## 2016-10-31 DIAGNOSIS — E1143 Type 2 diabetes mellitus with diabetic autonomic (poly)neuropathy: Secondary | ICD-10-CM

## 2016-10-31 DIAGNOSIS — K3184 Gastroparesis: Secondary | ICD-10-CM

## 2016-10-31 LAB — BASIC METABOLIC PANEL
ANION GAP: 6 (ref 5–15)
BUN: 12 mg/dL (ref 6–20)
CALCIUM: 9 mg/dL (ref 8.9–10.3)
CO2: 24 mmol/L (ref 22–32)
Chloride: 105 mmol/L (ref 101–111)
Creatinine, Ser: 1.13 mg/dL — ABNORMAL HIGH (ref 0.44–1.00)
GFR, EST NON AFRICAN AMERICAN: 59 mL/min — AB (ref >60)
Glucose, Bld: 214 mg/dL — ABNORMAL HIGH (ref 65–99)
Potassium: 3.7 mmol/L (ref 3.5–5.1)
SODIUM: 135 mmol/L (ref 135–145)

## 2016-10-31 LAB — CBC
HCT: 28.8 % — ABNORMAL LOW (ref 36.0–46.0)
HEMOGLOBIN: 9.2 g/dL — AB (ref 12.0–15.0)
MCH: 21.8 pg — ABNORMAL LOW (ref 26.0–34.0)
MCHC: 31.9 g/dL (ref 30.0–36.0)
MCV: 68.2 fL — ABNORMAL LOW (ref 78.0–100.0)
Platelets: 450 10*3/uL — ABNORMAL HIGH (ref 150–400)
RBC: 4.22 MIL/uL (ref 3.87–5.11)
RDW: 20.1 % — ABNORMAL HIGH (ref 11.5–15.5)
WBC: 7.6 10*3/uL (ref 4.0–10.5)

## 2016-10-31 LAB — GLUCOSE, CAPILLARY
GLUCOSE-CAPILLARY: 246 mg/dL — AB (ref 65–99)
GLUCOSE-CAPILLARY: 336 mg/dL — AB (ref 65–99)
Glucose-Capillary: 298 mg/dL — ABNORMAL HIGH (ref 65–99)
Glucose-Capillary: 327 mg/dL — ABNORMAL HIGH (ref 65–99)
Glucose-Capillary: 392 mg/dL — ABNORMAL HIGH (ref 65–99)

## 2016-10-31 MED ORDER — RIVAROXABAN 20 MG PO TABS: 20.0000 mg | ORAL_TABLET | Freq: Every day | ORAL | Status: DC

## 2016-10-31 MED ORDER — RIVAROXABAN 15 MG PO TABS: 15.0000 mg | ORAL_TABLET | Freq: Two times a day (BID) | ORAL | 0 refills | Status: DC

## 2016-10-31 MED ORDER — INSULIN ASPART 100 UNIT/ML ~~LOC~~ SOLN
10.0000 [IU] | Freq: Once | SUBCUTANEOUS | Status: AC
  Administered 2016-10-31: 10 [IU] via SUBCUTANEOUS

## 2016-10-31 MED ORDER — RIVAROXABAN 15 MG PO TABS
15.0000 mg | ORAL_TABLET | Freq: Two times a day (BID) | ORAL | Status: DC
  Administered 2016-10-31: 15 mg via ORAL
  Filled 2016-10-31: qty 1

## 2016-10-31 NOTE — Progress Notes (Signed)
CBG 392, MD notified.  Last dose of insulin aspart 70/30, 15 units given at 1915.

## 2016-10-31 NOTE — Progress Notes (Addendum)
Date: 10/31/2016  Patient name: Shirley [name]  Medical record number: [id number]  Date of birth: Feb 19, 1976   I have seen and evaluated Shirley [name] and discussed their care with the Residency Team. Shirley [name] is a 41 yo woman with chief complaint of chest pain, dyspnea on exertion, lightheadedness, nausea, and vomiting. She was diagnosed on July 3 with a right leg DVT (proxximal and distal veins) after presenting with complaint of swollen right leg. She was prescribed Rivaroxaban but did not fill it until July 5th. On about July 7th, she started getting lightheadedness, chest pain dyspnea on exertion, dizziness, diaphoresis, nausea, and vomiting when walking. Her symptoms did not improve and she presented to the ED. She had a CTA which showed pulmonary emboli to the right upper, middle, and lower lobe and the left upper and lower lobes. She did not have any evidence of right heart strain. She was started on Lovenox and admitted to the inpatient service.  Of note, she was also diagnosed with UTI on July 3. Her documented symptoms were R flank pain, urinary frequency, odor to urine, and pressure. She denied dysuria. Her UA was not clean catch and show large leukocyte esterase and too numerous to count white blood cells. It was negative for nitrite. Culture showed greater than 100,000 colonies of Escherichia coli pansensitive. She was sent home on Levaquin.  This morning she continues to complain of chest pain. She has tolerated oral intake without N/V. She continues to have right leg swelling. She denies dysuria but does complain of frequency and back pain  Vitals:   10/31/16 1113 10/31/16 1350  BP:  117/74  Pulse: (!) 102 86  Resp: 16 18  Temp: 98.5 F (36.9 C) 98.2 F (36.8 C)  NAD, no respiratory distress HRRR no MRG LCTAB good air movement, no accessory muscle ABD + BS, soft Ext no edema  Cr 1.19 Ferritin 5 (4/18) HgB 9.4 (baseline) MCV 67 Plts 425  I personally viewed her  CXR images and confirmed by reading with the official read. 2 view, rotated, good quality, no abnl  I personally viewed her EKG and confirmed by reading with the official read. Sinus, nl axis, insig q  Assessment and Plan: I have seen and evaluated the patient as outlined above. I agree with the formulated Assessment and Plan as detailed in the residents' note, with the following changes: Shirley [name] is a 41 year old woman who was diagnosed with her first unprovoked DVT (a R DVT of proximal and distal veins) on July 3rd. she was unable to start her oral anticoagulation until 2 days later on the 5th. Therefore, her PE is not being considered as a treatment failure and she will be discharged on the same anticoagulant, rivaroxaban.   1. Acute PE with known recent R proximal and distal DVT, suboptimally treated - she will be discharged on Rivarooxaban since her treatment has been interrupted and her VTE is new, she will need to start the course from the beginning. We have also pharmacy as she has already taken 3 days from her [place] and will need instruction on how to restart the anticoagulation. Since her VTE is unprovoked, she will need a minimum of 3 months with strong consideration for extending the duration.   She should have a discussion with her PCP about stopping depo -provera due to her VTE along with her other cardiac RF (DM, HTN, known CAD, CVA). The risks likely outweigh the benefits.   2. CP non cardiac -  R/O with Trop I and EKG.   Stable for D/C home  Bartholomew Crews, MD 7/10/20182:24 PM

## 2016-10-31 NOTE — Progress Notes (Signed)
Patient discharge teaching given, including activity, diet, follow-up appoints, and medications. Patient verbalized understanding of all discharge instructions. IV access was d/c'd. Vitals are stable. Skin is intact except as charted in most recent assessments. Pt to be escorted out by NT, to be driven home by family. -At discharge patient states can't afford medicine. Pharmacy switched medicine to [facility].

## 2016-10-31 NOTE — Discharge Instructions (Signed)
Information on my medicine - XARELTO (rivaroxaban)  This medication education was reviewed with me or my healthcare representative as part of my discharge preparation.  The pharmacist that spoke with me during my hospital stay was:  [employer], Pharm.D., BCPS  WHY WAS XARELTO PRESCRIBED FOR YOU? Xarelto was prescribed to treat blood clots that may have been found in the veins of your legs (deep vein thrombosis) or in your lungs (pulmonary embolism) and to reduce the risk of them occurring again.  What do you need to know about Xarelto? The starting dose is one 15 mg tablet taken TWICE daily with food for the FIRST 21 DAYS then on 11/22/16  the dose is changed to one 20 mg tablet taken ONCE A DAY with your evening meal.  DO NOT stop taking Xarelto without talking to the health care provider who prescribed the medication.  Refill your prescription for 20 mg tablets before you run out.  After discharge, you should have regular check-up appointments with your healthcare provider that is prescribing your Xarelto.  In the future your dose may need to be changed if your kidney function changes by a significant amount.  What do you do if you miss a dose? If you are taking Xarelto TWICE DAILY and you miss a dose, take it as soon as you remember. You may take two 15 mg tablets (total 30 mg) at the same time then resume your regularly scheduled 15 mg twice daily the next day.  If you are taking Xarelto ONCE DAILY and you miss a dose, take it as soon as you remember on the same day then continue your regularly scheduled once daily regimen the next day. Do not take two doses of Xarelto at the same time.   Important Safety Information Xarelto is a blood thinner medicine that can cause bleeding. You should call your healthcare provider right away if you experience any of the following: ? Bleeding from an injury or your nose that does not stop. ? Unusual colored urine (red or dark brown) or  unusual colored stools (red or black). ? Unusual bruising for unknown reasons. ? A serious fall or if you hit your head (even if there is no bleeding).  Some medicines may interact with Xarelto and might increase your risk of bleeding while on Xarelto. To help avoid this, consult your healthcare provider or pharmacist prior to using any new prescription or non-prescription medications, including herbals, vitamins, non-steroidal anti-inflammatory drugs (NSAIDs) and supplements.  This website has more information on Xarelto: [email or web address].[email or web address]

## 2016-10-31 NOTE — Progress Notes (Signed)
Lovenox given to pt, pt back to sleep no c/o pain.

## 2016-10-31 NOTE — Discharge Summary (Signed)
Name: Shirley [name] MRN: [id number] DOB: 1976/03/05 41 y.o. PCP: Tawny Asal  Date of Admission: 10/30/2016  6:09 AM Date of Discharge: 10/31/2016 Attending Physician: Bartholomew Crews, MD  Discharge Diagnosis:  Principal Problem:   Bilateral pulmonary embolism ([place]) Active Problems:   Poorly controlled type 2 diabetes mellitus with peripheral neuropathy ([place])   Hyperlipidemia   Essential hypertension   Chronic ischemic heart disease   Iron deficiency anemia   UTI (urinary tract infection)   Diabetic gastroparesis associated with type 2 diabetes mellitus ([place])   Type 2 diabetes mellitus with hyperglycemia ([facility])   Nausea and vomiting   Acute DVT (deep venous thrombosis) ([place])   Pulmonary embolus ([place])   Discharge Medications: Allergies as of 10/31/2016      Reactions   Lisinopril Nausea Only, Swelling   Morphine And Related Itching, Swelling      Medication List    STOP taking these medications   levofloxacin 750 MG tablet Commonly known as:  LEVAQUIN     TAKE these medications   acetaminophen 500 MG tablet Commonly known as:  TYLENOL Take 500 mg by mouth every 6 (six) hours as needed for mild pain.   albuterol 108 (90 Base) MCG/ACT inhaler Commonly known as:  PROVENTIL HFA;VENTOLIN HFA Inhale 2 puffs into the lungs every 4 (four) hours as needed for wheezing or shortness of breath.   aspirin EC 81 MG tablet Take 1 tablet (81 mg total) by mouth daily.   cephALEXin 500 MG capsule Commonly known as:  KEFLEX Take 2 capsules (1,000 mg total) by mouth 2 (two) times daily.   clindamycin 1 % gel Commonly known as:  CLINDAGEL Apply topically 2 (two) times daily.   cloNIDine 0.1 MG tablet Commonly known as:  CATAPRES Take 1 tablet (0.1 mg total) by mouth daily as needed. Take 1 tablet if bp above 782 systolic   ferrous sulfate 325 (65 FE) MG tablet Take 1 tablet (325 mg total) by mouth 2 (two) times daily with a meal.   freestyle  lancets Use as instructed   glucose blood test strip Commonly known as:  FREESTYLE TEST STRIPS Use as instructed   glucose monitoring kit monitoring kit 1 each by Does not apply route 4 (four) times daily - after meals and at bedtime. 1 month Diabetic Testing Supplies for QAC-QHS accuchecks.   hydrochlorothiazide 25 MG tablet Commonly known as:  HYDRODIURIL Take 1 tablet (25 mg total) by mouth daily. Take on tablet in the morning.   insulin aspart 100 UNIT/ML injection Commonly known as:  NOVOLOG Inject 10 Units into the skin once.   insulin NPH-regular Human (70-30) 100 UNIT/ML injection Commonly known as:  NOVOLIN 70/30 25 units in the morning and 25 units in the evening.   INSULIN SYRINGE .5CC/28G 28G X 1/2" 0.5 ML Misc Commonly known as:  INS SYRINGE/NEEDLE .5CC/28G Please provide 1 month supply   losartan 50 MG tablet Commonly known as:  COZAAR Take 1 tablet (50 mg total) by mouth daily.   lovastatin 20 MG tablet Commonly known as:  MEVACOR Take 1 tablet (20 mg total) by mouth at bedtime.   metoCLOPramide 10 MG tablet Commonly known as:  REGLAN Take 1 tablet (10 mg total) by mouth 4 (four) times daily -  before meals and at bedtime.   metoprolol tartrate 50 MG tablet Commonly known as:  LOPRESSOR Take 1 tablet (50 mg total) by mouth 2 (two) times daily.   omeprazole 40 MG capsule  Commonly known as:  PRILOSEC Take 1 capsule (40 mg total) by mouth daily.   promethazine 25 MG tablet Commonly known as:  PHENERGAN Take 1 tablet (25 mg total) by mouth every 8 (eight) hours as needed for nausea or vomiting.   Rivaroxaban 15 & 20 MG Tbpk Take as directed on package: Start with one 69m tablet by mouth twice a day with food. On Day 22, switch to one 225mtablet once a day with food. What changed:  Another medication with the same name was added. Make sure you understand how and when to take each.   Rivaroxaban 15 MG Tabs tablet Commonly known as:  XARELTO Take 1  tablet (15 mg total) by mouth 2 (two) times daily with a meal. What changed:  You were already taking a medication with the same name, and this prescription was added. Make sure you understand how and when to take each.       Disposition and follow-up:   [name]  At the hospital follow up visit please address:  1.  Bilateral PE: Please assess patient's adherence to medication and ensure she understands the importance of not missing doses.  Also, please discuss the risks/benefits of remaining on depo-provera in the setting of DVT/PE and adjust/change medication as indicated. Monitor for recurrence of symptoms.   2.  Labs / imaging needed at time of follow-up: None  3.  Pending labs/ test needing follow-up: None  Follow-up Appointments: Follow-up Information    [facility]   Contact information: [phone] by problem list:   1. Bilateral Pulmonary Embolism: Mrs. LiTienresented with 2 days of squeezing Chest Pain and Shortness of Breath. Of note, she was seen in the ED on 10/24/16 and diagnosed with LE DVT. She was prescribed Xarelto, but did not pick up her prescription until 10/26/16. In the ED on 10/30/16, she had a non ischemic EKG and negative troponin x2. A CTA Chest was positive for bilateral pulmonary emboli. She was initially placed on a heparin drip and then was transitioned to therapeutic Lovenox. Overnight, her shortness of breath improved and her respiratory rate and heart rate normalized.   Based upon her disruption in therapy, this PE is not being considered a failure of anticoagulation therapy. She was restarted on Xarelto, and is starting the treatment over (additional 1558marelto have been prescribed to replace the ones she had previously taken.)  2. UTI:  Diagnosed on 7/3 ED visit and given a course of antibiotics. Resolved. Residual urinary frequency likely due to elevated blood glucose.  3. CAD, HTN, HLD: Home medications were continued during her hospital stay except for HCTZ which was resumed at discharge.  4. DM type 2 with Gastroparesis, Hx of Gastritis, Iron Deficiency Anemia: Home medications were contiuned during hospital stay.  Discharge Vitals:   BP 117/74 (BP Location: Right Arm)   Pulse 86   Temp 98.2 F (36.8 C) (Oral)   Resp 18   Ht 5' 7"  (1.702 m)   Wt 212 lb 11.9 oz (96.5 kg)   SpO2 100%   BMI 33.32 kg/m   Pertinent Labs, Studies, and Procedures:  CBC Latest Ref Rng & Units 10/31/2016 10/30/2016 10/24/2016  WBC 4.0 - 10.5 K/uL 7.6 11.3(H) 14.1(H)  Hemoglobin 12.0 - 15.0 g/dL 9.2(L) 9.4(L) 11.1(L)  Hematocrit 36.0 - 46.0 % 28.8(L) 29.0(L) 33.0(L)  Platelets 150 - 400 K/uL 450(H) 425(H) 302   BMP Latest Ref Rng & Units 10/31/2016 10/30/2016 10/24/2016  Glucose 65 - 99 mg/dL 214(H) 365(H) 403(H)  BUN 6 - 20 mg/dL 12 15 10   Creatinine 0.44 - 1.00 mg/dL 1.13(H) 1.19(H) 1.02(H)  Sodium 135 - 145 mmol/L 135 132(L) 131(L)  Potassium 3.5 - 5.1 mmol/L 3.7 4.7 4.3  Chloride 101 - 111 mmol/L 105 101 100(L)  CO2 22 - 32 mmol/L 24 22 22   Calcium 8.9 - 10.3 mg/dL 9.0 9.0 9.4   Troponin-I: negative x 2  CXR: IMPRESSION: - No active cardiopulmonary disease.  CTA Chest: IMPRESSION: - Study is positive for bilateral pulmonary emboli. No evidence for right heart strain. - Atelectasis versus infarct the lingula. - Bronchiectasis and scarring in the left upper lobe.  Discharge Instructions: Discharge Instructions    Diet - low sodium heart healthy    Complete by:  As directed    Discharge instructions    Complete by:  As directed    We believe the clots discovered in your lungs originated from the clot in your leg. You are being treated with anticoagulation (Xarelto) to help breakdown the clots you have and to help prevent ne  clots from forming.   You have already been prescribed Xarelto. Following this hospitalization, you treatment with Xarelto is starting over, so you will need to retake the doses you have already taken previously as well as the ones remaining in your dose pack. You have been prescribed 8 additional 6m doses to replace the ones you took.  Please take all 162mdoses (both new prescription and those remaining in the dose pack) as instructed on the dose pack before beginning the 2017moses.  For pain, It is recommend that you use over the counter pain medications as directed. NSAIDs such as Advil and Ibuprofen have been shown to be effective in treating plueritc (lung) chest pain.  If you experience a return of severe or worsening symptoms please go to the Emergency Department.   Increase activity slowly    Complete by:  As directed       Signed: AleNeva SeatO IM PGY-1 Pager: [phone]

## 2016-10-31 NOTE — Progress Notes (Signed)
ANTICOAGULATION CONSULT NOTE - Follow Up Consult  Pharmacy Consult for Xarelto Indication: bilateral pulmonary embolus, recent DVT  Allergies  Allergen Reactions  . Lisinopril Nausea Only and Swelling  . Morphine And Related Itching and Swelling    Patient Measurements: Height: 5\' 7"  (170.2 cm) Weight: 212 lb 11.9 oz (96.5 kg) IBW/kg (Calculated) : 61.6  Vital Signs: Temp: 98.5 F (36.9 C) (07/10 1113) Temp Source: Oral (07/10 1113) BP: 149/78 (07/10 1052) Pulse Rate: 102 (07/10 1113)  Labs:  Recent Labs  10/30/16 0013 10/30/16 1047 10/30/16 1100 10/30/16 2046 10/31/16 0436  HGB 9.4*  --   --   --  9.2*  HCT 29.0*  --   --   --  28.8*  PLT 425*  --   --   --  450*  APTT  --  84*  --   --   --   HEPARINUNFRC  --   --  1.80*  --   --   CREATININE 1.19*  --   --   --  1.13*  TROPONINI  --   --   --  <0.03  --     Estimated Creatinine Clearance: 76.6 mL/min (A) (by C-G formula based on SCr of 1.13 mg/dL (H)).   Medications:  Scheduled:  . aspirin EC  81 mg Oral Daily  . enoxaparin (LOVENOX) injection  1 mg/kg Subcutaneous Q12H  . ferrous sulfate  325 mg Oral BID WC  . insulin aspart  0-15 Units Subcutaneous TID WC  . insulin aspart protamine- aspart  15 Units Subcutaneous BID WC  . losartan  50 mg Oral Daily  . metoprolol tartrate  50 mg Oral BID  . pantoprazole  40 mg Oral Daily  . pravastatin  20 mg Oral q1800  . sodium chloride flush  3 mL Intravenous Q12H   Infusions:    Assessment: 41 yo F presented to ED with chest pain and SOB.  CT showed bilateral PE. Pt has a hx of DVT and reports missing her Xarelto x 1-2 days PTA.  Pt was started on heparin >> lovenox >> now with plans to restart Xarelto dosing.  Since this is a new clot, will restart with BID x 21 day dosing regimen.  Goal of Therapy:  therapeutic anticoagulation Monitor platelets by anticoagulation protocol: Yes   Plan:  Discontinue Lovenox (last dose 0600) Xarelto 15mg  PO BID x 21 days  (start at 1800 tonight) Followed by Xarelto 20mg  daily with supper Xarelto re-education completed  [employer], Pharm.D., BCPS Clinical Pharmacist Pager: [phone] Clinical phone for 10/31/2016 from 8:30-4:00 is [phone]. After 4pm, please call Main [facility] [phone]) for assistance. 10/31/2016 1:16 PM

## 2016-10-31 NOTE — Progress Notes (Signed)
Patient ID: Shirley [name], female   DOB: 01-02-1976, 41 y.o.   MRN: [id number]   Subjective: Shirley [name] states that she is feeling better this morning. She says her breathing is better and she can now lay more comfortably on her back. She still endorses pain, but states it has improved. She also continues to endorse R calf pain. It was discussed with her that this pulmonary embolism likely occur because she missed 1 - 2 days of her Xarelto and that it is import she does not miss any days of the medication when she leaves on the medication.  Objective:  Vital signs in last 24 hours: Vitals:   10/30/16 2000 10/30/16 2029 10/30/16 2210 10/31/16 0539  BP: (!) 147/90 136/73 (!) 148/85 129/83  Pulse: (!) 107 (!) 111 93 80  Resp: 18   18  Temp:    98.1 F (36.7 C)  TempSrc:    Oral  SpO2: 99%   99%  Weight:    212 lb 11.9 oz (96.5 kg)  Height:       Physical Exam  Constitutional: She is oriented to person, place, and time. She appears well-developed and well-nourished. No distress.  Eyes: EOM are normal. Right eye exhibits no discharge. Left eye exhibits no discharge.  Cardiovascular: Normal rate and regular rhythm.  Exam reveals no friction rub.   No murmur heard. Pulmonary/Chest: Effort normal and breath sounds normal. No respiratory distress. She has no wheezes.  Abdominal: Soft. Bowel sounds are normal.  Musculoskeletal: She exhibits no deformity.  Tenderness at R Calf  Neurological: She is alert and oriented to person, place, and time.  Skin: Skin is warm and dry.     Assessment/Plan:  Bilateral PE, Right DVT: Has had 2 days of SOB and chest tightness. Seen in the ED 7/3 and diagnosed with new right LE DVT. She denies recent history of trauma, surgery, prolonged immobility, recent travel. She has no known malignancy.  She is obese and is on Depo-Provera for contraception. CTA revealed bilateral pulmonary emboli, but no evidence of right heart strain. She was discharged on 7/3 but  did not pick up xarelto until 7/5 (this does not represent a treatment failure)  - Start Lovenox 1mg /kg.  Consult pharmacy for assistance. - Hold Xarelto for now, restart on discharge - Ambulate on pulse oximetry - Tyleonol for mild pain - Tramadol for moderate pain  UTI: Urine culture from 7/3:  pan-sensitive E. Coli. On Levaquin and reports having 2 pills left. Has ongoing urinary frequency, may be secondary to osmotic diuresis from hyperglycemia. Her pharmacy reports she picked up this medication on 7/5 so she took for 3 days and may be adequately treated. She is afebrile and her WBC is chronically elevated - Asymptomatic, Resolved  CAD s/p MI, HTN, HLD: Non-ischemic EKG and negative troponins x 2.  Hypertensive in the ED at 159/99. - Continue ASA 81mg  daily  - Continue Losartan 50mg  daily - Continue Metoprolol tartrate 25mg  BID - Hold HCTZ 25mg  daily.  Resume as needed.  DM type 2 with Gastroparesis: Reports now being on Novolog 25 units BID although medication list reports Novolin 70/30.  Glucose greater than 300 on admission.  Last A1c 8.6 in May 2018. - Sliding Scale with HS coverage - Novolin 70/30 15 units BID - Phenergan 25mg  PO Q8H as needed  Hx of Gastritis - Protonix 40mg  daily  Iron Deficiency Anemia: Iron of 20, ferritin 5, saturation ratio of 4 in April 2018. - Continue ferrous  sulfate 325mg  BID  FEN Fluids: None Electrolytes: Replace as needed Nutrition: Heart Healthy  DVT PPx:  Lovenox as above  Dispo: Anticipated discharge later today.  Neva Seat, DO IM PGY-1 Pager: [phone]

## 2016-10-31 NOTE — Progress Notes (Signed)
Results for DENITRA, DONAGHEY (MRN [id number]) as of 10/31/2016 12:57  Ref. Range 10/30/2016 16:50 10/30/2016 21:38 10/31/2016 00:56 10/31/2016 07:56 10/31/2016 11:54  Glucose-Capillary Latest Ref Range: 65 - 99 mg/dL 283 (H) 353 (H) 392 (H) 246 (H) 298 (H)  Noted that blood sugars greater than 180 mg/dl.  Recommend increasing 70/30 insulin to home dose of 25 units BID and continuing Novolog MODERATE correction scale TID. Will continue to monitor blood sugars while in the hospital.   Harvel Ricks RN BSN CDE Diabetes Coordinator Pager: [phone]  8am-5pm

## 2016-11-04 ENCOUNTER — Emergency Department (HOSPITAL_COMMUNITY)
Admission: EM | Admit: 2016-11-04 | Discharge: 2016-11-05 | Disposition: A | Discharge status: Home / Self Care | Payer: Self-pay | Attending: Emergency Medicine | Admitting: Emergency Medicine

## 2016-11-04 ENCOUNTER — Emergency Department (HOSPITAL_COMMUNITY): Payer: Self-pay

## 2016-11-04 ENCOUNTER — Encounter (HOSPITAL_COMMUNITY): Payer: Self-pay | Admitting: Emergency Medicine

## 2016-11-04 ENCOUNTER — Other Ambulatory Visit: Payer: Self-pay

## 2016-11-04 DIAGNOSIS — R1030 Lower abdominal pain, unspecified: Secondary | ICD-10-CM | POA: Insufficient documentation

## 2016-11-04 DIAGNOSIS — N3001 Acute cystitis with hematuria: Secondary | ICD-10-CM | POA: Insufficient documentation

## 2016-11-04 DIAGNOSIS — I119 Hypertensive heart disease without heart failure: Secondary | ICD-10-CM | POA: Insufficient documentation

## 2016-11-04 DIAGNOSIS — E1143 Type 2 diabetes mellitus with diabetic autonomic (poly)neuropathy: Secondary | ICD-10-CM | POA: Insufficient documentation

## 2016-11-04 DIAGNOSIS — Z79899 Other long term (current) drug therapy: Secondary | ICD-10-CM | POA: Insufficient documentation

## 2016-11-04 DIAGNOSIS — Z87891 Personal history of nicotine dependence: Secondary | ICD-10-CM | POA: Insufficient documentation

## 2016-11-04 DIAGNOSIS — Z7982 Long term (current) use of aspirin: Secondary | ICD-10-CM | POA: Insufficient documentation

## 2016-11-04 DIAGNOSIS — I2510 Atherosclerotic heart disease of native coronary artery without angina pectoris: Secondary | ICD-10-CM | POA: Insufficient documentation

## 2016-11-04 DIAGNOSIS — R072 Precordial pain: Secondary | ICD-10-CM | POA: Insufficient documentation

## 2016-11-04 DIAGNOSIS — Z7901 Long term (current) use of anticoagulants: Secondary | ICD-10-CM | POA: Insufficient documentation

## 2016-11-04 DIAGNOSIS — Z794 Long term (current) use of insulin: Secondary | ICD-10-CM | POA: Insufficient documentation

## 2016-11-04 DIAGNOSIS — I252 Old myocardial infarction: Secondary | ICD-10-CM | POA: Insufficient documentation

## 2016-11-04 DIAGNOSIS — Z885 Allergy status to narcotic agent status: Secondary | ICD-10-CM | POA: Insufficient documentation

## 2016-11-04 LAB — COMPREHENSIVE METABOLIC PANEL
ALBUMIN: 3.3 g/dL — AB (ref 3.5–5.0)
ALK PHOS: 59 U/L (ref 38–126)
ALT: 9 U/L — ABNORMAL LOW (ref 14–54)
AST: 19 U/L (ref 15–41)
Anion gap: 10 (ref 5–15)
BUN: 14 mg/dL (ref 6–20)
CO2: 22 mmol/L (ref 22–32)
Calcium: 9.7 mg/dL (ref 8.9–10.3)
Chloride: 99 mmol/L — ABNORMAL LOW (ref 101–111)
Creatinine, Ser: 1.13 mg/dL — ABNORMAL HIGH (ref 0.44–1.00)
GFR calc Af Amer: >60 mL/min (ref >60)
GFR calc non Af Amer: 59 mL/min — ABNORMAL LOW (ref >60)
GLUCOSE: 423 mg/dL — AB (ref 65–99)
POTASSIUM: 3.9 mmol/L (ref 3.5–5.1)
SODIUM: 131 mmol/L — AB (ref 135–145)
Total Bilirubin: 0.5 mg/dL (ref 0.3–1.2)
Total Protein: 8.4 g/dL — ABNORMAL HIGH (ref 6.5–8.1)

## 2016-11-04 LAB — URINALYSIS, ROUTINE W REFLEX MICROSCOPIC
Bilirubin Urine: NEGATIVE
Glucose, UA: >=500 mg/dL — AB
Ketones, ur: NEGATIVE mg/dL
Leukocytes, UA: NEGATIVE
Nitrite: NEGATIVE
Protein, ur: 30 mg/dL — AB
Specific Gravity, Urine: 1.028 (ref 1.005–1.030)
pH: 6 (ref 5.0–8.0)

## 2016-11-04 LAB — I-STAT TROPONIN, ED: Troponin i, poc: 0 ng/mL (ref 0.00–0.08)

## 2016-11-04 LAB — LIPASE, BLOOD: Lipase: 36 U/L (ref 11–51)

## 2016-11-04 LAB — CBC
HEMATOCRIT: 32.5 % — AB (ref 36.0–46.0)
HEMOGLOBIN: 10.9 g/dL — AB (ref 12.0–15.0)
MCH: 22.2 pg — AB (ref 26.0–34.0)
MCHC: 33.5 g/dL (ref 30.0–36.0)
MCV: 66.3 fL — ABNORMAL LOW (ref 78.0–100.0)
Platelets: 528 10*3/uL — ABNORMAL HIGH (ref 150–400)
RBC: 4.9 MIL/uL (ref 3.87–5.11)
RDW: 20.9 % — ABNORMAL HIGH (ref 11.5–15.5)
WBC: 12.3 10*3/uL — ABNORMAL HIGH (ref 4.0–10.5)

## 2016-11-04 MED ORDER — ONDANSETRON 4 MG PO TBDP
4.0000 mg | ORAL_TABLET | Freq: Once | ORAL | Status: AC | PRN
  Administered 2016-11-04: 4 mg via ORAL

## 2016-11-04 MED ORDER — ONDANSETRON 4 MG PO TBDP
ORAL_TABLET | ORAL | Status: AC
  Filled 2016-11-04: qty 1

## 2016-11-04 NOTE — ED Triage Notes (Signed)
Pt to ED c/o recurrent SOB and CP starting about 1300 today - pt was dx with blood clot in her leg that broke off and traveled to her lungs last week or so - states she was admitted for 2 days. Pt has been to ED for same since and discharged. Pt reports R leg was throbbing this morning and is still hurting off and on (states it was the leg that had the clot). Pt reports she decided to come in because her CP and SOB have been getting worse. Resp e/u, skin warm/dry at this time. Denies dizziness but endorses nausea without vomiting.

## 2016-11-04 NOTE — ED Notes (Signed)
Pt and family updated about wait time, pt assured that we are working to get a room available for her.

## 2016-11-04 NOTE — ED Notes (Signed)
Pt's relative up to desk stating that pt is in extreme pain. Pt appears tearful.

## 2016-11-04 NOTE — ED Provider Notes (Signed)
[facility] DEPT Provider Note   CSN: [id number] Arrival date & time: 11/04/16  2149 By signing my name below, I, Shirley [name], attest that this documentation has been prepared under the direction and in the presence of Shirley Schmidt, MD . Electronically Signed: Dyke [name], Scribe. 11/04/2016. 12:44 AM.   History   Chief Complaint Chief Complaint  Patient presents with  . Shortness of Breath  . Chest Pain   HPI Shirley [name] is a 41 y.o. female with a history of CAD, CVA, DM, and MI who presents to the Emergency Department complaining of progressively worsening, severe chest pain onset at 1 PM today. She reports associated shortness of breath, nausea, vomiting, RLQ abdominal pain, and urinary frequency. No alleviating or modifying factors noted.  Pt states her symptoms began with intermittent throbbing right leg pain, followed by SOB, abdominal pain, and then chest pain. Pt was diagnosed with a DVT two weeks ago and started on Xarelto. She was then admitted to the hospital on 10/30/16 for bilateral pulmonary embolism. Per pt, she missed her dose of Xarelto yesterday.  Pt denies any dysuria, hematuria, hematochezia,   The history is provided by the patient. No language interpreter was used.   Past Medical History:  Diagnosis Date  . Abscess of tunica vaginalis    10/09- Abundant S. aureus- sensitive to all abx  . Anxiety   . Blood dyscrasia   . CAD (coronary artery disease) 06/15/2006   s/p Subendocardial MI with PDA angioplasty(no stent) on 06/15/06 and relook  cath 06/19/06 showed patency of site. Cath 12/10- no restenosis or significant CAD progression  . CVA (cerebral vascular accident) ([place]) ~ 02/2014   denies residual on 04/22/2014  . CVA (cerebral vascular accident) [facility])    history of remote right cerebellar infarct noted on head CT at least since 10/2011  . Depression   . Diabetes mellitus type 2, uncontrolled, with complications ([place])   . Fibromyalgia   . Gastritis    . Gastroparesis    secondary to poorly controlled DM, last emptying study performed 01/2010  was normal but may be falsely positive as pt was on reglan  . GERD (gastroesophageal reflux disease)   . Hepatitis B, chronic (HCC)    Hep BeAb+,Hep B cAb+ & Hep BsAg+ (9/06)  . History of pyelonephritis    H/o GrpB Pyelonephritis (9/06) and UTI- 07/11- E.Coli, 12/10- GBS  . Hyperlipidemia   . Hypertension   . Iron deficiency anemia   . Irregular menses    Small ovarian follicles seen on FU(9/32)  . MI (myocardial infarction) ([place]) 05/2006   PDA percutaneous transluminal coronary angioplasty  . Migraine    "weekly" (04/22/2014)  . N&V (nausea and vomiting)    Chronic. Unclear etiology with multiple admission and ED visits. CT abdomen with and without contrast (02/2011)  showed no acute process. Gastic Emptying scan (01/2010) was normal. Ultrasound of the abdomen was within normal limits. Hepatitis B viral load was undectable. HIV NR. EGD - gastritis, Hpylori + s/p Rx  . Obesity   . OSA (obstructive sleep apnea)    "suppose to wear mask but I don't" (04/22/2014)  . Peripheral neuropathy   . Pneumonia    "this is probably the 2nd or 3rd time I've had pneumonia" (04/22/2014)  . Recurrent boils   . Seasonal asthma   . Substance abuse   . Thrombocytosis ([place])    Hem/Onc suggested 2/2 chronic hepatits and/or iron deficiency anemia    Patient Active Problem List  Diagnosis Date Noted  . Bilateral pulmonary embolism ([place]) 10/30/2016  . Acute DVT (deep venous thrombosis) ([place]) 10/30/2016  . Pulmonary embolus ([place]) 10/30/2016  . Nausea and vomiting 09/23/2016  . Intractable nausea and vomiting 08/11/2016  . Type 2 diabetes mellitus with hyperglycemia ([place]) 04/07/2016  . Nausea & vomiting 04/07/2016  . Diabetic gastroparesis ([place]) 04/06/2016  . Non-intractable cyclical vomiting with nausea   . Dehydration 03/27/2015  . Intractable vomiting with nausea 11/11/2014  . Gastroparesis 11/11/2014   . Cough   . Hematemesis 10/13/2014  . DKA (diabetic ketoacidoses) ([place]) 10/13/2014  . Increased anion gap metabolic acidosis 54/62/7035  . Tachycardia 07/21/2014  . Abdominal pain, chronic, left lower quadrant   . Nausea with vomiting   . Diabetic gastroparesis associated with type 2 diabetes mellitus ([place]) 04/28/2014  . History of Helicobacter pylori infection 04/22/2014  . Vaginal discharge 02/18/2014  . Atypical chest pain 07/22/2013  . Unspecified constipation 07/21/2013  . Tinea corporis 07/21/2013  . Intractable vomiting 04/29/2013  . Dysmenorrhea 04/22/2013  . UTI (urinary tract infection) 07/15/2012  . Headache(784.0) 02/08/2012  . Health care maintenance 01/22/2012  . Chronic hepatitis B ([place]) 03/07/2011  . History of leukocytosis 04/06/2010  . THROMBOCYTOSIS 04/06/2010  . Polysubstance abuse 02/23/2010  . Iron deficiency anemia 11/22/2009  . PERIPHERAL NEUROPATHY 10/01/2009  . Hyperlipidemia 08/30/2009  . Diabetic polyneuropathy ([place]) 08/30/2009  . Hidradenitis (recurrent boils) 07/07/2008  . Depression 12/27/2007  . Abdominal pain, left lower quadrant 11/21/2007  . FIBROMYALGIA 10/30/2007  . BACK PAIN 04/01/2007  . OBSTRUCTIVE SLEEP APNEA 01/17/2007  . ANXIETY DEPRESSION 06/27/2006  . Chronic ischemic heart disease 06/15/2006  . OBESITY, MORBID 05/15/2006  . MIGRAINE HEADACHE 05/15/2006  . Asthma 05/15/2006  . Essential hypertension 01/16/2006  . IRREGULAR MENSTRUATION 01/16/2006  . PEDAL EDEMA 01/16/2006  . Poorly controlled type 2 diabetes mellitus with peripheral neuropathy ([place]) 01/16/1989    Past Surgical History:  Procedure Laterality Date  . CESAREAN SECTION  1997  . CORONARY ANGIOPLASTY WITH STENT PLACEMENT  2008   "2 stents"  . ESOPHAGOGASTRODUODENOSCOPY N/A 04/23/2014   Procedure: ESOPHAGOGASTRODUODENOSCOPY (EGD);  Surgeon: Winfield Cunas., MD;  [facility] ENDOSCOPY;  Service: Endoscopy;  Laterality: N/A;    OB History    No data  available       Home Medications    Prior to Admission medications   Medication Sig Start Date End Date Taking? Authorizing Provider  acetaminophen (TYLENOL) 500 MG tablet Take 500 mg by mouth every 6 (six) hours as needed for mild pain.     [provider]  albuterol (PROVENTIL HFA;VENTOLIN HFA) 108 (90 Base) MCG/ACT inhaler Inhale 2 puffs into the lungs every 4 (four) hours as needed for wheezing or shortness of breath. 09/27/16   Clent Demark, PA-C  aspirin EC 81 MG tablet Take 1 tablet (81 mg total) by mouth daily. 09/27/16   Clent Demark, PA-C  cephALEXin (KEFLEX) 500 MG capsule Take 2 capsules (1,000 mg total) by mouth 2 (two) times daily. 10/18/16   Lawyer, Harrell Gave, PA-C  clindamycin (CLINDAGEL) 1 % gel Apply topically 2 (two) times daily. 09/27/16   Clent Demark, PA-C  cloNIDine (CATAPRES) 0.1 MG tablet Take 1 tablet (0.1 mg total) by mouth daily as needed. Take 1 tablet if bp above 009 systolic 3/81/82 01/01/36  Nita Sells, MD  ferrous sulfate 325 (65 FE) MG tablet Take 1 tablet (325 mg total) by mouth 2 (two) times daily with a meal. 09/05/16  Clent Demark, PA-C  glucose blood (FREESTYLE TEST STRIPS) test strip Use as instructed 09/05/16   Clent Demark, PA-C  glucose monitoring kit (FREESTYLE) monitoring kit 1 each by Does not apply route 4 (four) times daily - after meals and at bedtime. 1 month Diabetic Testing Supplies for QAC-QHS accuchecks. 09/05/16   Clent Demark, PA-C  hydrochlorothiazide (HYDRODIURIL) 25 MG tablet Take 1 tablet (25 mg total) by mouth daily. Take on tablet in the morning. 09/27/16   Clent Demark, PA-C  insulin aspart (NOVOLOG) 100 UNIT/ML injection Inject 10 Units into the skin once. 09/27/16 09/27/16  Clent Demark, PA-C  insulin NPH-regular Human (NOVOLIN 70/30) (70-30) 100 UNIT/ML injection 25 units in the morning and 25 units in the evening. 09/27/16   Clent Demark, PA-C  INSULIN SYRINGE .5CC/28G (INS  SYRINGE/NEEDLE .5CC/28G) 28G X 1/2" 0.5 ML MISC Please provide 1 month supply 09/27/16   Clent Demark, PA-C  Lancets (FREESTYLE) lancets Use as instructed 09/27/16   Clent Demark, PA-C  losartan (COZAAR) 50 MG tablet Take 1 tablet (50 mg total) by mouth daily. 09/27/16   Clent Demark, PA-C  lovastatin (MEVACOR) 20 MG tablet Take 1 tablet (20 mg total) by mouth at bedtime. 09/27/16   Clent Demark, PA-C  metoCLOPramide (REGLAN) 10 MG tablet Take 1 tablet (10 mg total) by mouth 4 (four) times daily -  before meals and at bedtime. Patient not taking: Reported on 10/24/2016 09/27/16   Clent Demark, PA-C  metoprolol tartrate (LOPRESSOR) 50 MG tablet Take 1 tablet (50 mg total) by mouth 2 (two) times daily. 09/27/16   Clent Demark, PA-C  omeprazole (PRILOSEC) 40 MG capsule Take 1 capsule (40 mg total) by mouth daily. 09/27/16   Clent Demark, PA-C  promethazine (PHENERGAN) 25 MG tablet Take 1 tablet (25 mg total) by mouth every 8 (eight) hours as needed for nausea or vomiting. 10/18/16   Lawyer, Harrell Gave, PA-C  Rivaroxaban (XARELTO) 15 MG TABS tablet Take 1 tablet (15 mg total) by mouth 2 (two) times daily with a meal. 10/31/16 11/04/16  Neva Seat, MD  Rivaroxaban 15 & 20 MG TBPK Take as directed on package: Start with one 32m tablet by mouth twice a day with food. On Day 22, switch to one 275mtablet once a day with food. 10/24/16   MoShary DecampPA-C    Family History Family History  Problem Relation Age of Onset  . Diabetes Father     Social History Social History  Substance Use Topics  . Smoking status: Former Smoker    Types: Cigarettes    Quit date: 04/24/1996  . Smokeless tobacco: Never Used     Comment: quit smoking cigarettes age 41. Alcohol use No     Comment: 04/22/2014 "might have a few drinks a month"     Allergies   Lisinopril and Morphine and related   Review of Systems Review of Systems All systems reviewed and are negative for acute  change except as noted in the HPI.  Physical Exam Updated Vital Signs BP 120/84 (BP Location: Right Arm)   Pulse (!) 137   Temp 98.6 F (37 C) (Oral)   Resp 20   LMP 11/04/2016   SpO2 99%   Physical Exam  Constitutional: She is oriented to person, place, and time. She appears well-developed and well-nourished. No distress.  HENT:  Head: Normocephalic and atraumatic.  Eyes: EOM are normal.  Neck: Normal range  of motion.  Cardiovascular: Normal rate, regular rhythm and normal heart sounds.   Normal PT and DP pulses bilaterally   Pulmonary/Chest: Effort normal and breath sounds normal.  Abdominal: Soft. She exhibits no distension. There is tenderness.  Suprapubic and RLQ tenderness  Musculoskeletal: Normal range of motion. She exhibits no edema.  No BLE edema  Neurological: She is alert and oriented to person, place, and time.  Skin: Skin is warm and dry.  Psychiatric: She has a normal mood and affect. Judgment normal.  Nursing note and vitals reviewed.  ED Treatments / Results  DIAGNOSTIC STUDIES:  Oxygen Saturation is 99% on RA, normal by my interpretation.    COORDINATION OF CARE:  12:44 AM Will order CT renal stone study. Discussed treatment plan which includes dilaudid, Zofran and Ativan with pt at bedside and pt agreed to plan.   Labs (all labs ordered are listed, but only abnormal results are displayed) Labs Reviewed  COMPREHENSIVE METABOLIC PANEL - Abnormal; Notable for the following:       Result Value   Sodium 131 (*)    Chloride 99 (*)    Glucose, Bld 423 (*)    Creatinine, Ser 1.13 (*)    Total Protein 8.4 (*)    Albumin 3.3 (*)    ALT 9 (*)    GFR calc non Af Amer 59 (*)    All other components within normal limits  CBC - Abnormal; Notable for the following:    WBC 12.3 (*)    Hemoglobin 10.9 (*)    HCT 32.5 (*)    MCV 66.3 (*)    MCH 22.2 (*)    RDW 20.9 (*)    Platelets 528 (*)    All other components within normal limits  URINALYSIS, ROUTINE  W REFLEX MICROSCOPIC - Abnormal; Notable for the following:    Glucose, UA >=500 (*)    Hgb urine dipstick LARGE (*)    Protein, ur 30 (*)    Bacteria, UA RARE (*)    Squamous Epithelial / LPF 0-5 (*)    All other components within normal limits  URINE CULTURE  LIPASE, BLOOD  PREGNANCY, URINE  I-STAT TROPOININ, ED    EKG  EKG Interpretation  Date/Time:  Saturday November 04 2016 21:53:47 EDT Ventricular Rate:  132 PR Interval:  116 QRS Duration: 80 QT Interval:  302 QTC Calculation: 447 R Axis:   57 Text Interpretation:  Sinus tachycardia Cannot rule out Anterior infarct , age undetermined Abnormal ECG When compared with ECG of 10/30/2016, HEART RATE has increased Confirmed by Delora Fuel ([id number]) on 11/04/2016 11:32:24 PM       Radiology Dg Chest 2 View  Result Date: 11/04/2016 CLINICAL DATA:  Chest pain with shortness of breath EXAM: CHEST  2 VIEW COMPARISON:  10/30/2016, 05/29/2016 FINDINGS: The heart size and mediastinal contours are within normal limits. Both lungs are clear. The visualized skeletal structures are unremarkable. IMPRESSION: No active cardiopulmonary disease. Electronically Signed   By: Donavan Foil M.D.   On: 11/04/2016 22:38   Ct Renal Stone Study  Result Date: 11/05/2016 CLINICAL DATA:  Recurrent shortness of breath and chest pain, right-sided flank and abdominal pain EXAM: CT ABDOMEN AND PELVIS WITHOUT CONTRAST TECHNIQUE: Multidetector CT imaging of the abdomen and pelvis was performed following the standard protocol without IV contrast. COMPARISON:  10/24/2016 FINDINGS: Lower chest: Lung bases demonstrate no acute consolidation or pleural effusion. Borderline heart size. Hepatobiliary: No focal liver abnormality is seen. No gallstones, gallbladder wall  thickening, or biliary dilatation. Pancreas: Unremarkable. No pancreatic ductal dilatation or surrounding inflammatory changes. Spleen: Normal in size without focal abnormality. Adrenals/Urinary Tract: Adrenal  glands are unremarkable. Kidneys are normal, without renal calculi, focal lesion, or hydronephrosis. Bladder is unremarkable. Stomach/Bowel: Stomach is within normal limits. Appendix appears normal. No evidence of bowel wall thickening, distention, or inflammatory changes. Vascular/Lymphatic: No significant vascular findings are present. No enlarged abdominal or pelvic lymph nodes. Reproductive: Enlarged globular appearing uterus similar compared to prior. No adnexal masses. Other: No free air or free fluid. Musculoskeletal: Degenerative changes of the spine. No acute or suspicious bone lesion. IMPRESSION: 1. Negative for nephrolithiasis or hydronephrosis. Negative for ureteral stone. 2. Negative appendix 3. Prominent globular appearing uterus as before. Electronically Signed   By: Donavan Foil M.D.   On: 11/05/2016 02:51    Procedures Procedures (including critical care time)  Medications Ordered in ED Medications  iopamidol (ISOVUE-370) 76 % injection (not administered)  ondansetron (ZOFRAN-ODT) disintegrating tablet 4 mg (4 mg Oral Given 11/04/16 2206)  HYDROmorphone (DILAUDID) injection 1 mg (1 mg Intravenous Given 11/05/16 0117)  ondansetron (ZOFRAN) injection 4 mg (4 mg Intravenous Given 11/05/16 0118)  LORazepam (ATIVAN) injection 1 mg (1 mg Intravenous Given 11/05/16 0121)  cefTRIAXone (ROCEPHIN) 1 g in dextrose 5 % 50 mL IVPB (0 g Intravenous Stopped 11/05/16 0227)     Initial Impression / Assessment and Plan / ED Course  I have reviewed the triage vital signs and the nursing notes.  Pertinent labs & imaging results that were available during my care of the patient were reviewed by me and considered in my medical decision making (see chart for details).     Lower abdominal discomfort with urinary symptoms consistent with urinary tract infection.  Rocephin given in the emergency department.  Discharge home with Keflex.  Urine culture sent. Regards to her chest pain shortness of breath  that is now much better.  Doubt acute coronary syndrome.  Doubt recurrent PE.  She only missed one dose of Xarelto.  If she did have her recurrent PE this would likely be secondary to her missed dose therefore reinitiating Xarelto will be treatment of choice for her.  She'll take her Xarelto when she gets home.  Hemodynamically stable.  Discharge home in good condition.  Primary care follow-up.  Final Clinical Impressions(s) / ED Diagnoses   Final diagnoses:  Lower abdominal pain  Acute cystitis with hematuria  Precordial pain    New Prescriptions New Prescriptions   CEPHALEXIN (KEFLEX) 500 MG CAPSULE    Take 1 capsule (500 mg total) by mouth 3 (three) times daily.   ONDANSETRON (ZOFRAN ODT) 8 MG DISINTEGRATING TABLET    Take 1 tablet (8 mg total) by mouth every 8 (eight) hours as needed for nausea or vomiting.   I personally performed the services described in this documentation, which was scribed in my presence. The recorded information has been reviewed and is accurate.        Shirley Schmidt, MD 11/05/16 [phone]

## 2016-11-05 ENCOUNTER — Emergency Department (HOSPITAL_COMMUNITY): Payer: Self-pay

## 2016-11-05 LAB — PREGNANCY, URINE: Preg Test, Ur: NEGATIVE

## 2016-11-05 MED ORDER — HYDROCODONE-ACETAMINOPHEN 5-325 MG PO TABS: 1.0000 | ORAL_TABLET | Freq: Four times a day (QID) | ORAL | 0 refills | Status: DC | PRN

## 2016-11-05 MED ORDER — HYDROCODONE-ACETAMINOPHEN 5-325 MG PO TABS: 1.0000 | ORAL_TABLET | Freq: Four times a day (QID) | ORAL | Status: DC | PRN

## 2016-11-05 MED ORDER — CEPHALEXIN 500 MG PO CAPS: 500.0000 mg | ORAL_CAPSULE | Freq: Three times a day (TID) | ORAL | 0 refills | Status: DC

## 2016-11-05 MED ORDER — IOPAMIDOL (ISOVUE-370) INJECTION 76%
INTRAVENOUS | Status: AC
  Filled 2016-11-05: qty 100

## 2016-11-05 MED ORDER — HYDROMORPHONE HCL 1 MG/ML IJ SOLN
1.0000 mg | Freq: Once | INTRAMUSCULAR | Status: AC
  Administered 2016-11-05: 1 mg via INTRAVENOUS
  Filled 2016-11-05: qty 1

## 2016-11-05 MED ORDER — DEXTROSE 5 % IV SOLN
1.0000 g | Freq: Once | INTRAVENOUS | Status: AC
  Administered 2016-11-05: 1 g via INTRAVENOUS
  Filled 2016-11-05: qty 10

## 2016-11-05 MED ORDER — ONDANSETRON 8 MG PO TBDP: 8.0000 mg | ORAL_TABLET | Freq: Three times a day (TID) | ORAL | 0 refills | Status: DC | PRN

## 2016-11-05 MED ORDER — ONDANSETRON HCL 4 MG/2ML IJ SOLN
4.0000 mg | Freq: Once | INTRAMUSCULAR | Status: AC
  Administered 2016-11-05: 4 mg via INTRAVENOUS
  Filled 2016-11-05: qty 2

## 2016-11-05 MED ORDER — LORAZEPAM 2 MG/ML IJ SOLN
1.0000 mg | Freq: Once | INTRAMUSCULAR | Status: AC
  Administered 2016-11-05: 1 mg via INTRAVENOUS
  Filled 2016-11-05: qty 1

## 2016-11-05 NOTE — ED Notes (Signed)
Patient transported to CT 

## 2016-11-05 NOTE — ED Notes (Signed)
Pt returned from CT °

## 2016-11-06 ENCOUNTER — Other Ambulatory Visit: Payer: Self-pay

## 2016-11-06 ENCOUNTER — Encounter (HOSPITAL_COMMUNITY): Payer: Self-pay | Admitting: Emergency Medicine

## 2016-11-06 DIAGNOSIS — K920 Hematemesis: Secondary | ICD-10-CM | POA: Insufficient documentation

## 2016-11-06 DIAGNOSIS — N946 Dysmenorrhea, unspecified: Secondary | ICD-10-CM | POA: Insufficient documentation

## 2016-11-06 DIAGNOSIS — E785 Hyperlipidemia, unspecified: Secondary | ICD-10-CM | POA: Insufficient documentation

## 2016-11-06 DIAGNOSIS — N898 Other specified noninflammatory disorders of vagina: Secondary | ICD-10-CM | POA: Insufficient documentation

## 2016-11-06 DIAGNOSIS — F419 Anxiety disorder, unspecified: Secondary | ICD-10-CM | POA: Insufficient documentation

## 2016-11-06 DIAGNOSIS — I2510 Atherosclerotic heart disease of native coronary artery without angina pectoris: Secondary | ICD-10-CM | POA: Insufficient documentation

## 2016-11-06 DIAGNOSIS — Z955 Presence of coronary angioplasty implant and graft: Secondary | ICD-10-CM | POA: Insufficient documentation

## 2016-11-06 DIAGNOSIS — J479 Bronchiectasis, uncomplicated: Secondary | ICD-10-CM | POA: Insufficient documentation

## 2016-11-06 DIAGNOSIS — M797 Fibromyalgia: Secondary | ICD-10-CM | POA: Insufficient documentation

## 2016-11-06 DIAGNOSIS — J45909 Unspecified asthma, uncomplicated: Secondary | ICD-10-CM | POA: Insufficient documentation

## 2016-11-06 DIAGNOSIS — D509 Iron deficiency anemia, unspecified: Secondary | ICD-10-CM | POA: Insufficient documentation

## 2016-11-06 DIAGNOSIS — K219 Gastro-esophageal reflux disease without esophagitis: Secondary | ICD-10-CM | POA: Insufficient documentation

## 2016-11-06 DIAGNOSIS — Z7982 Long term (current) use of aspirin: Secondary | ICD-10-CM | POA: Insufficient documentation

## 2016-11-06 DIAGNOSIS — B354 Tinea corporis: Secondary | ICD-10-CM | POA: Insufficient documentation

## 2016-11-06 DIAGNOSIS — G8929 Other chronic pain: Secondary | ICD-10-CM | POA: Insufficient documentation

## 2016-11-06 DIAGNOSIS — K3184 Gastroparesis: Secondary | ICD-10-CM | POA: Insufficient documentation

## 2016-11-06 DIAGNOSIS — G4733 Obstructive sleep apnea (adult) (pediatric): Secondary | ICD-10-CM | POA: Insufficient documentation

## 2016-11-06 DIAGNOSIS — E1110 Type 2 diabetes mellitus with ketoacidosis without coma: Secondary | ICD-10-CM | POA: Insufficient documentation

## 2016-11-06 DIAGNOSIS — Z8619 Personal history of other infectious and parasitic diseases: Secondary | ICD-10-CM | POA: Insufficient documentation

## 2016-11-06 DIAGNOSIS — Z86711 Personal history of pulmonary embolism: Secondary | ICD-10-CM | POA: Insufficient documentation

## 2016-11-06 DIAGNOSIS — B181 Chronic viral hepatitis B without delta-agent: Secondary | ICD-10-CM | POA: Insufficient documentation

## 2016-11-06 DIAGNOSIS — G43A0 Cyclical vomiting, not intractable: Secondary | ICD-10-CM | POA: Insufficient documentation

## 2016-11-06 DIAGNOSIS — D251 Intramural leiomyoma of uterus: Secondary | ICD-10-CM | POA: Insufficient documentation

## 2016-11-06 DIAGNOSIS — Z79899 Other long term (current) drug therapy: Secondary | ICD-10-CM | POA: Insufficient documentation

## 2016-11-06 DIAGNOSIS — N920 Excessive and frequent menstruation with regular cycle: Secondary | ICD-10-CM | POA: Insufficient documentation

## 2016-11-06 DIAGNOSIS — F329 Major depressive disorder, single episode, unspecified: Secondary | ICD-10-CM | POA: Insufficient documentation

## 2016-11-06 DIAGNOSIS — E1143 Type 2 diabetes mellitus with diabetic autonomic (poly)neuropathy: Secondary | ICD-10-CM | POA: Insufficient documentation

## 2016-11-06 DIAGNOSIS — R1031 Right lower quadrant pain: Principal | ICD-10-CM | POA: Insufficient documentation

## 2016-11-06 DIAGNOSIS — Z8673 Personal history of transient ischemic attack (TIA), and cerebral infarction without residual deficits: Secondary | ICD-10-CM | POA: Insufficient documentation

## 2016-11-06 DIAGNOSIS — Z794 Long term (current) use of insulin: Secondary | ICD-10-CM | POA: Insufficient documentation

## 2016-11-06 DIAGNOSIS — Z86718 Personal history of other venous thrombosis and embolism: Secondary | ICD-10-CM | POA: Insufficient documentation

## 2016-11-06 DIAGNOSIS — Z87891 Personal history of nicotine dependence: Secondary | ICD-10-CM | POA: Insufficient documentation

## 2016-11-06 DIAGNOSIS — I10 Essential (primary) hypertension: Secondary | ICD-10-CM | POA: Insufficient documentation

## 2016-11-06 DIAGNOSIS — K5900 Constipation, unspecified: Secondary | ICD-10-CM | POA: Insufficient documentation

## 2016-11-06 DIAGNOSIS — Z6832 Body mass index (BMI) 32.0-32.9, adult: Secondary | ICD-10-CM | POA: Insufficient documentation

## 2016-11-06 DIAGNOSIS — Z7901 Long term (current) use of anticoagulants: Secondary | ICD-10-CM | POA: Insufficient documentation

## 2016-11-06 DIAGNOSIS — E1142 Type 2 diabetes mellitus with diabetic polyneuropathy: Secondary | ICD-10-CM | POA: Insufficient documentation

## 2016-11-06 DIAGNOSIS — I252 Old myocardial infarction: Secondary | ICD-10-CM | POA: Insufficient documentation

## 2016-11-06 LAB — URINE CULTURE

## 2016-11-06 IMAGING — US US ABDOMEN LIMITED
1 series · 14 of 25 positions shown · non-contrast
Comparison: Prior CT abdomen/pelvis 12/31/2013

CLINICAL DATA: 38-year-old female with right upper quadrant
abdominal pain

EXAM:
US ABDOMEN LIMITED - RIGHT UPPER QUADRANT

[Series 1: us abdomen limited · 0.27mm/px · 14 of 26 slices shown]
[im 1/26]
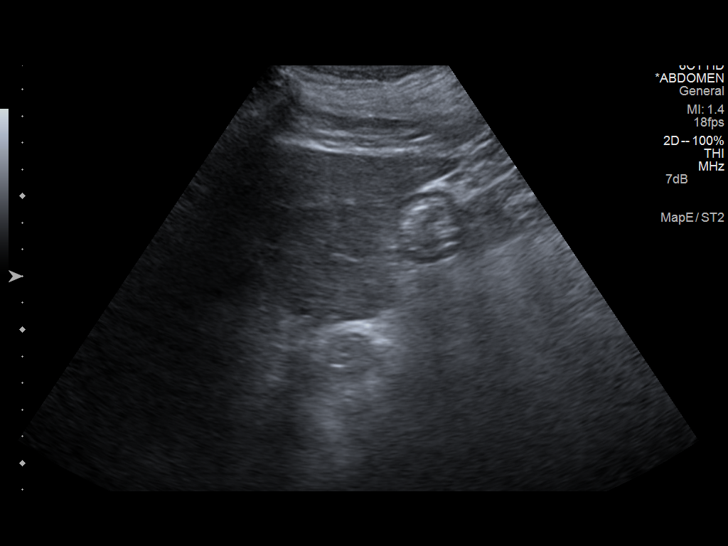
[im 3/26]
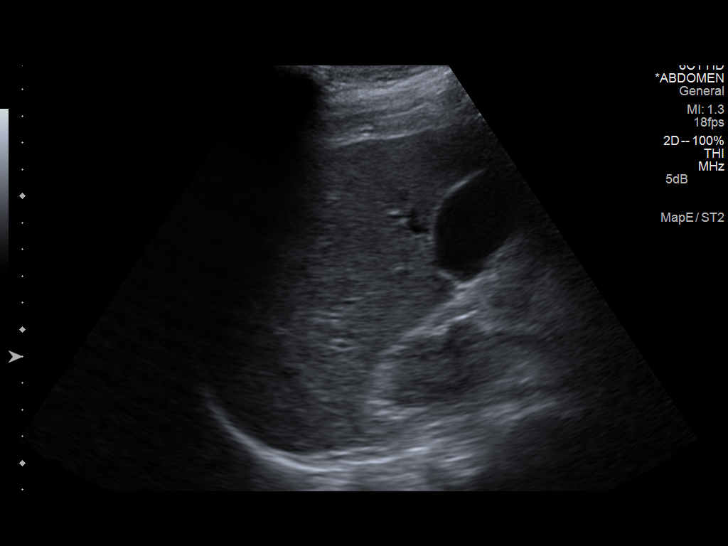
[im 5/26]
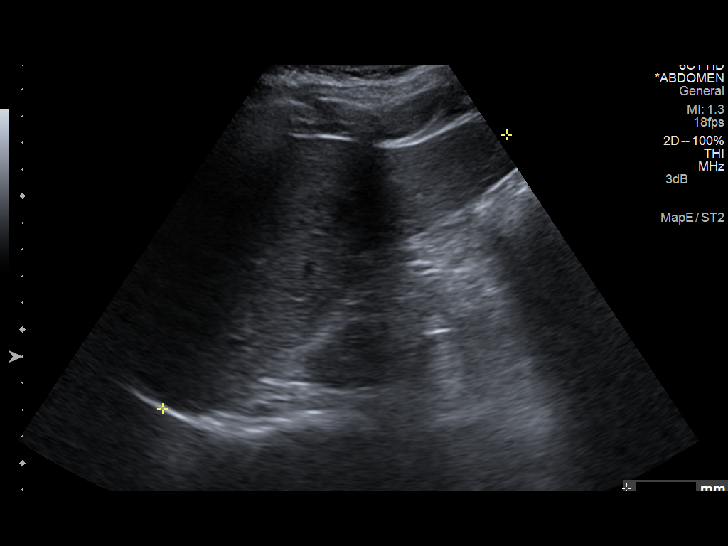
[im 7/26]
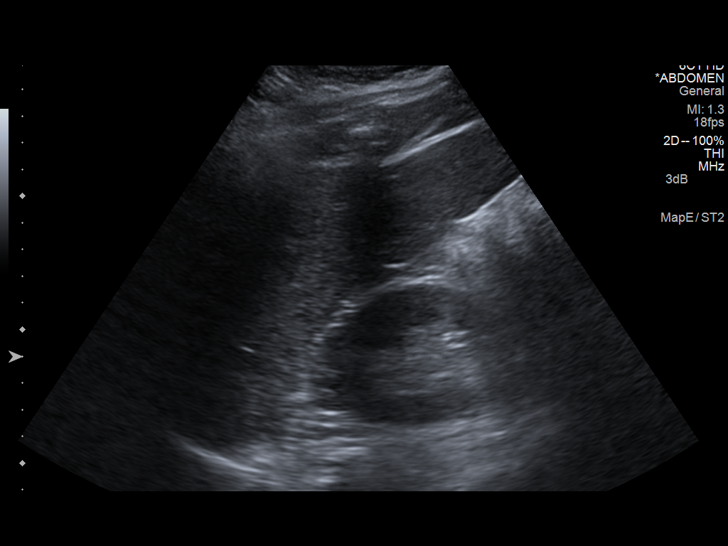
[im 9/26]
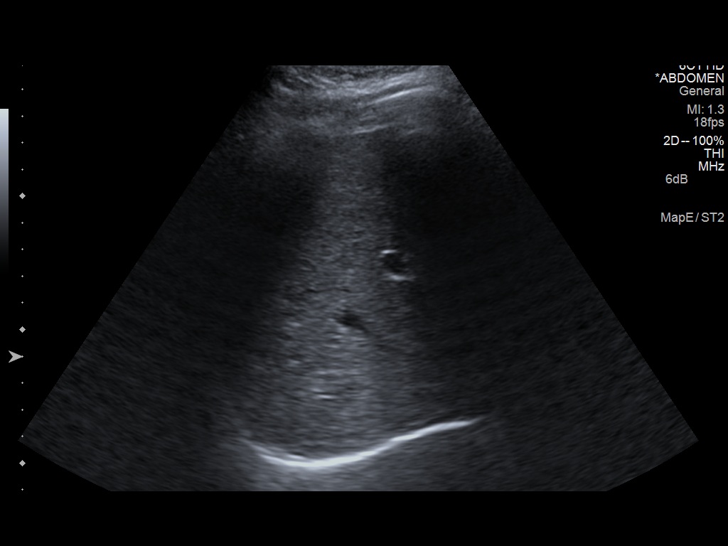
[im 10/26]
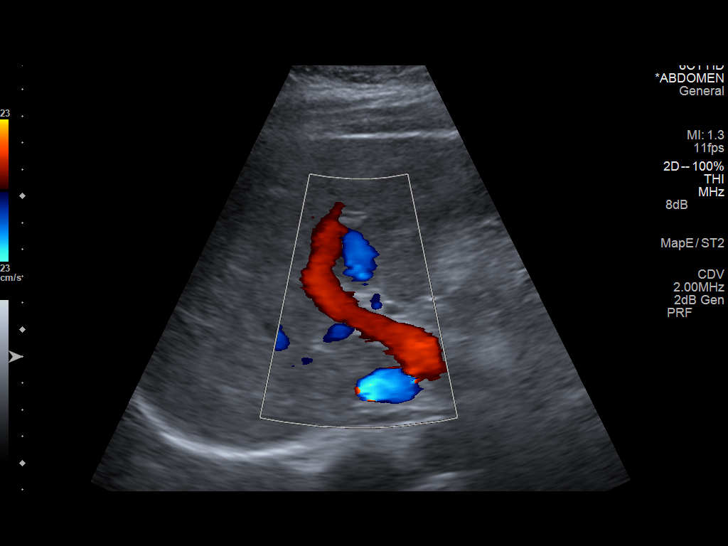
[im 12/26]
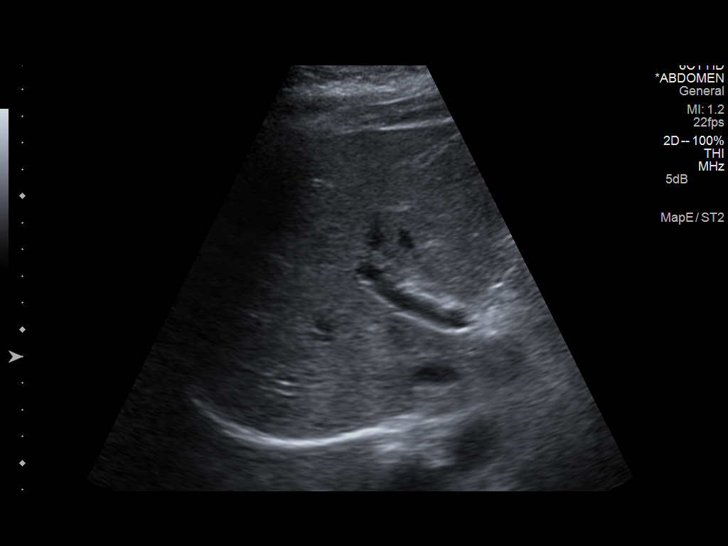
[im 14/26]
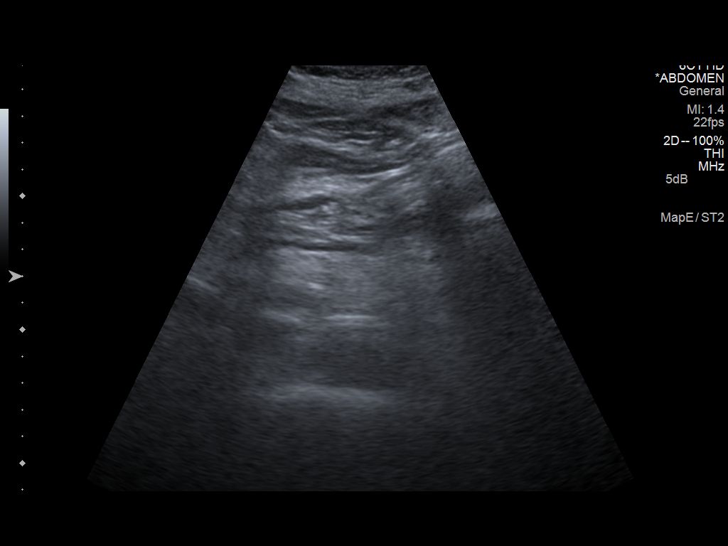
[im 16/26]
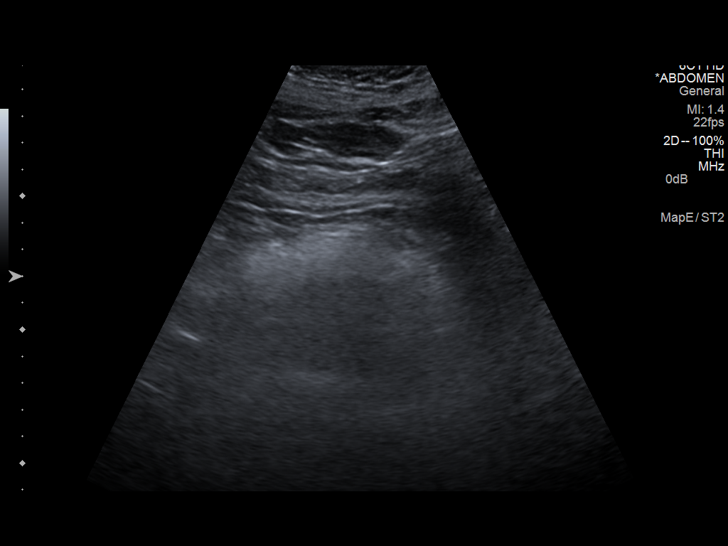
[im 17/26]
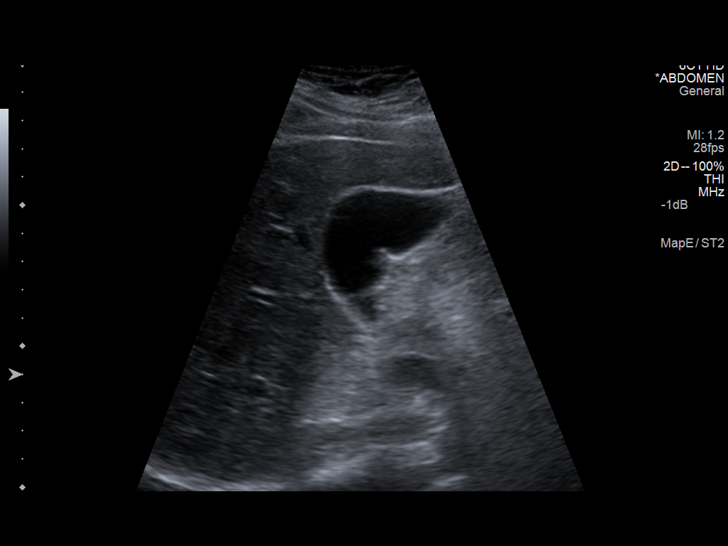
[im 19/26]
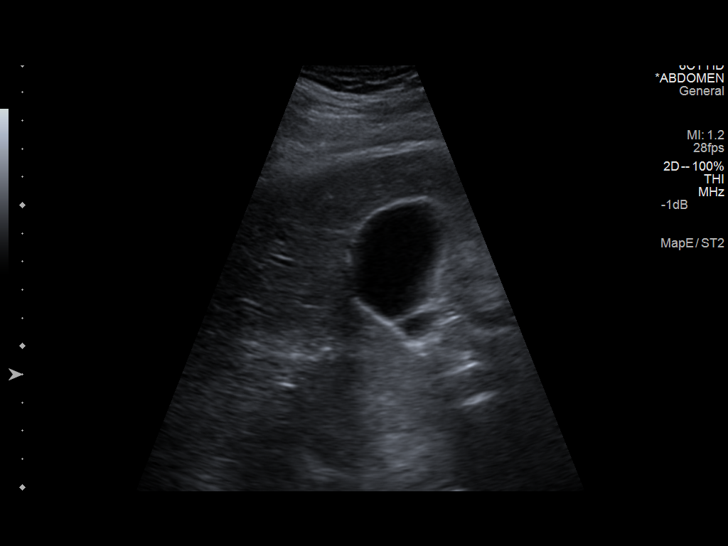
[im 21/26]
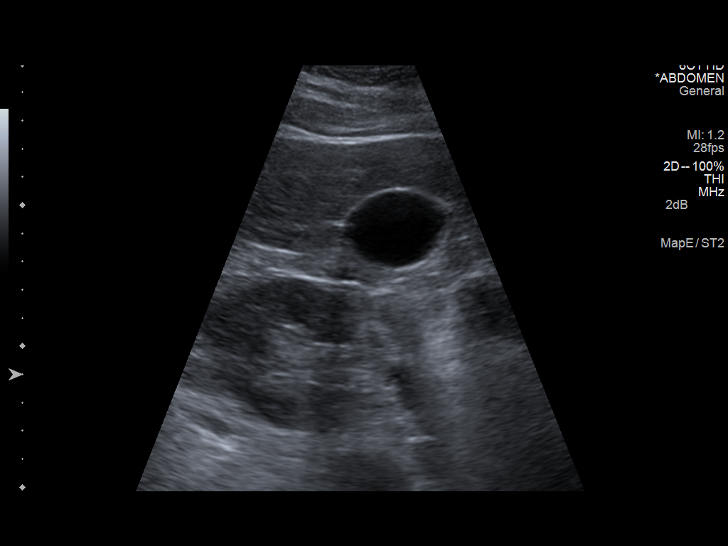
[im 23/26]
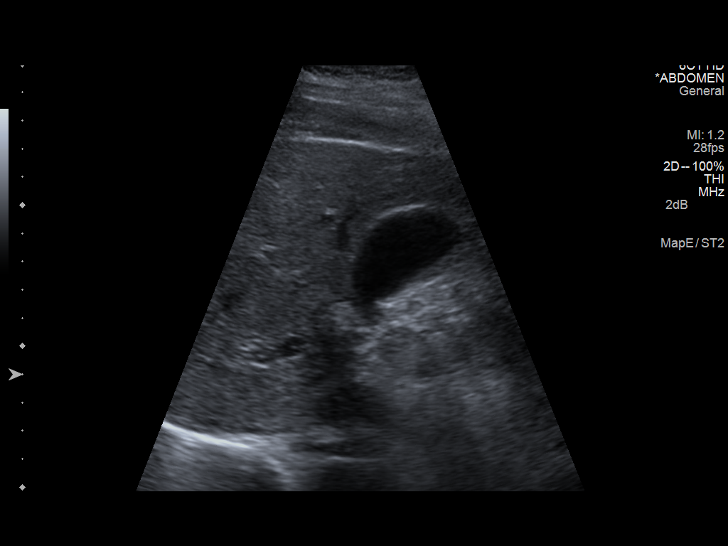
[im 26/26]
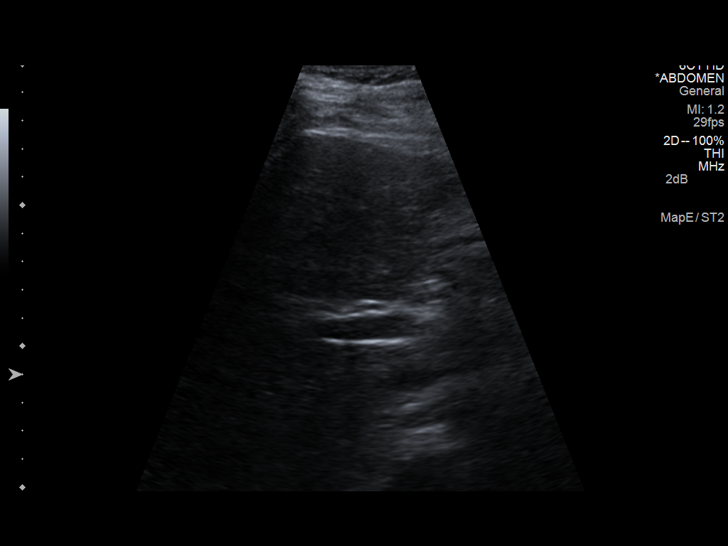

[14 of 25 positions shown; findings below may reference images not displayed]

FINDINGS: Gallbladder:

Small amounts of minimally echogenic mobile material within the
gallbladder lumen consistent with sludge. The gallbladder wall is
not thickened and there is no pericholecystic fluid. Per the
sonographer, the sonographic Murphy sign was negative.

Common bile duct:

Diameter:  Within normal limits at 3 mm

Liver:

No focal lesion identified. Within normal limits in parenchymal
echogenicity. Main portal vein patent with hepatopetal flow.
IMPRESSION: 1. Trace gallbladder sludge without sonographic findings to suggest
cholecystitis.
2. Otherwise, unremarkable right upper quadrant abdominal
ultrasound.

## 2016-11-06 IMAGING — DX DG CHEST 2V
2 series · 2 of 2 positions shown · non-contrast
Comparison: March 11, 2014.

CLINICAL DATA: Vomiting, weakness.

EXAM:
CHEST  2 VIEW

[chest pa]
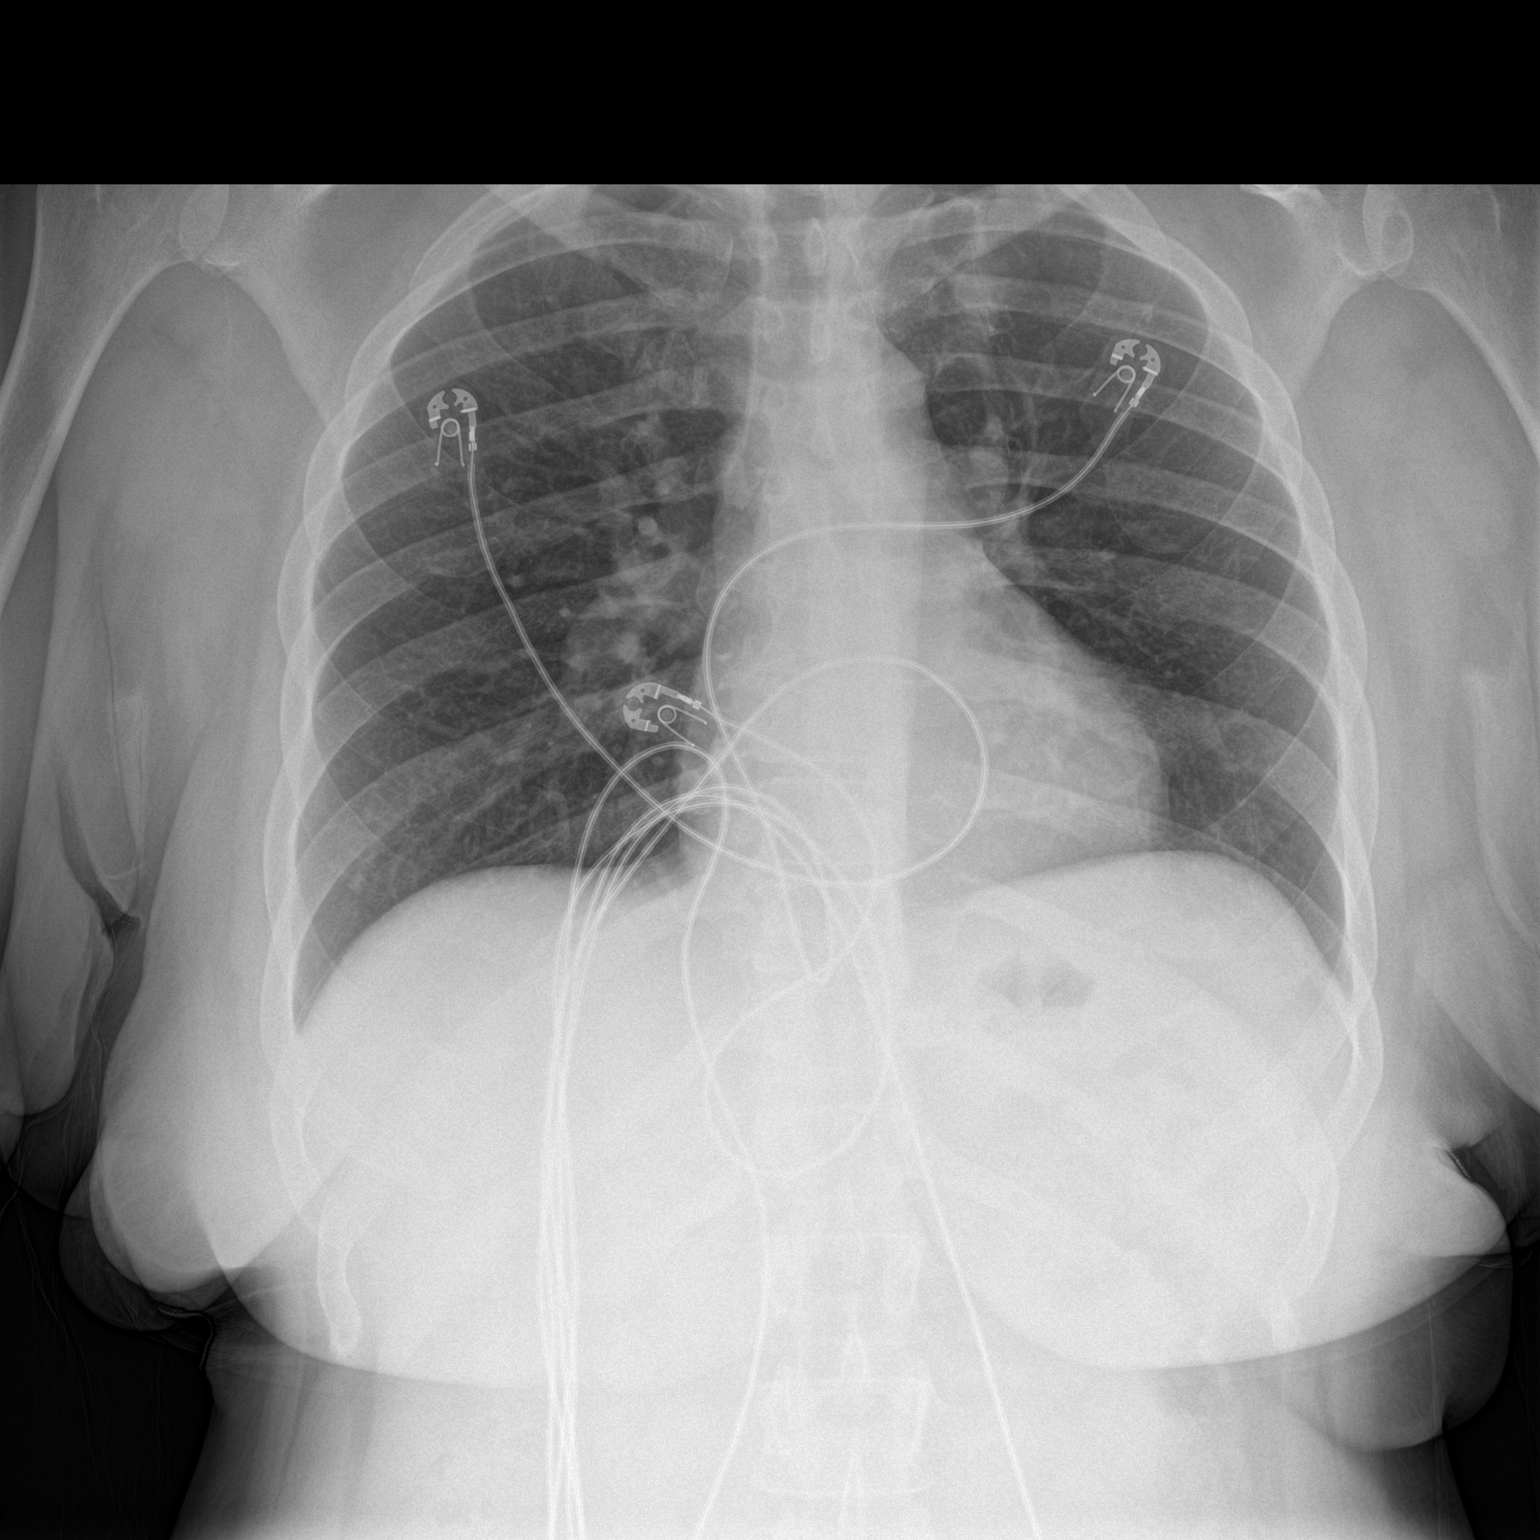

[chest lat]
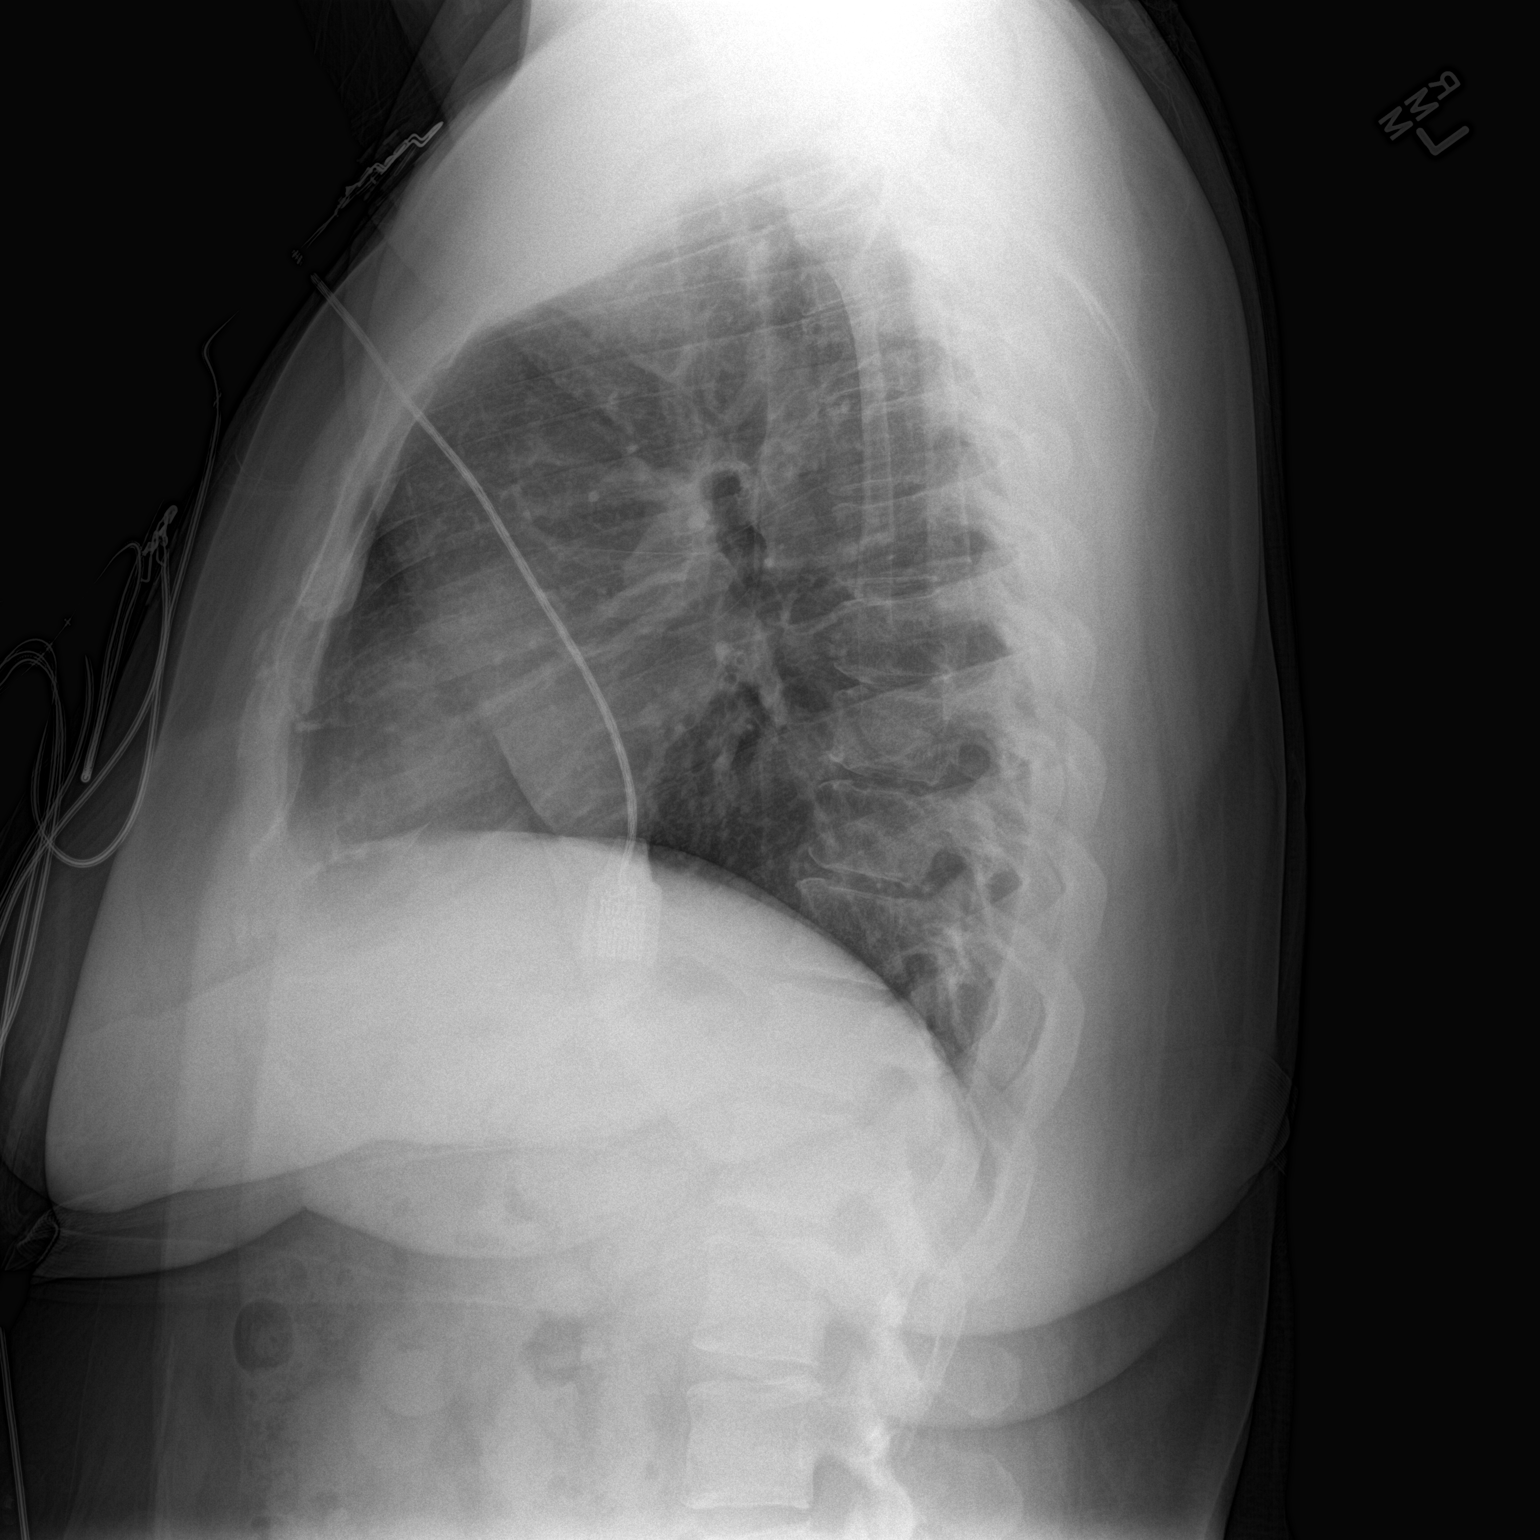

[2 of 2 positions shown; findings below may reference images not displayed]

FINDINGS: The heart size and mediastinal contours are within normal limits.
Both lungs are clear. No pneumothorax or pleural effusion is noted.
The visualized skeletal structures are unremarkable.
IMPRESSION: No active cardiopulmonary disease.

## 2016-11-06 MED ORDER — ONDANSETRON 4 MG PO TBDP
4.0000 mg | ORAL_TABLET | Freq: Once | ORAL | Status: AC | PRN
  Administered 2016-11-07: 4 mg via ORAL
  Filled 2016-11-06: qty 1

## 2016-11-06 MED ORDER — ONDANSETRON 4 MG PO TBDP
ORAL_TABLET | ORAL | Status: AC
  Administered 2016-11-06: 4 mg
  Filled 2016-11-06: qty 1

## 2016-11-06 NOTE — ED Triage Notes (Signed)
Pt c/o shortness of breath, abdominal pain and nausea. Pt seen for the same yesterday, hx PE, pt states she has been throwing up since she left yesterday.

## 2016-11-07 ENCOUNTER — Emergency Department (HOSPITAL_COMMUNITY): Payer: Medicaid Other

## 2016-11-07 ENCOUNTER — Observation Stay (HOSPITAL_COMMUNITY)
Admission: EM | Admit: 2016-11-07 | Discharge: 2016-11-08 | Disposition: A | Discharge status: Home / Self Care | Payer: Medicaid Other | Attending: Internal Medicine | Admitting: Internal Medicine

## 2016-11-07 DIAGNOSIS — R109 Unspecified abdominal pain: Secondary | ICD-10-CM | POA: Diagnosis present

## 2016-11-07 DIAGNOSIS — R1030 Lower abdominal pain, unspecified: Secondary | ICD-10-CM

## 2016-11-07 DIAGNOSIS — R112 Nausea with vomiting, unspecified: Secondary | ICD-10-CM

## 2016-11-07 DIAGNOSIS — R1031 Right lower quadrant pain: Principal | ICD-10-CM

## 2016-11-07 DIAGNOSIS — N12 Tubulo-interstitial nephritis, not specified as acute or chronic: Secondary | ICD-10-CM

## 2016-11-07 LAB — URINALYSIS, ROUTINE W REFLEX MICROSCOPIC

## 2016-11-07 LAB — GLUCOSE, CAPILLARY
GLUCOSE-CAPILLARY: 344 mg/dL — AB (ref 65–99)
Glucose-Capillary: 351 mg/dL — ABNORMAL HIGH (ref 65–99)

## 2016-11-07 LAB — COMPREHENSIVE METABOLIC PANEL
ALBUMIN: 3.4 g/dL — AB (ref 3.5–5.0)
ALK PHOS: 75 U/L (ref 38–126)
ALT: 9 U/L — AB (ref 14–54)
ANION GAP: 10 (ref 5–15)
AST: 20 U/L (ref 15–41)
BILIRUBIN TOTAL: 0.7 mg/dL (ref 0.3–1.2)
BUN: 12 mg/dL (ref 6–20)
CALCIUM: 9.5 mg/dL (ref 8.9–10.3)
CO2: 23 mmol/L (ref 22–32)
CREATININE: 1.04 mg/dL — AB (ref 0.44–1.00)
Chloride: 100 mmol/L — ABNORMAL LOW (ref 101–111)
GFR calc Af Amer: >60 mL/min (ref >60)
GFR calc non Af Amer: >60 mL/min (ref >60)
GLUCOSE: 321 mg/dL — AB (ref 65–99)
Potassium: 3.8 mmol/L (ref 3.5–5.1)
Sodium: 133 mmol/L — ABNORMAL LOW (ref 135–145)
TOTAL PROTEIN: 8.1 g/dL (ref 6.5–8.1)

## 2016-11-07 LAB — CBC
HCT: 32.5 % — ABNORMAL LOW (ref 36.0–46.0)
HEMOGLOBIN: 10.6 g/dL — AB (ref 12.0–15.0)
MCH: 22 pg — ABNORMAL LOW (ref 26.0–34.0)
MCHC: 32.6 g/dL (ref 30.0–36.0)
MCV: 67.4 fL — ABNORMAL LOW (ref 78.0–100.0)
PLATELETS: 578 10*3/uL — AB (ref 150–400)
RBC: 4.82 MIL/uL (ref 3.87–5.11)
RDW: 20.5 % — AB (ref 11.5–15.5)
WBC: 14 10*3/uL — ABNORMAL HIGH (ref 4.0–10.5)

## 2016-11-07 LAB — I-STAT BETA HCG BLOOD, ED (MC, WL, AP ONLY): I-stat hCG, quantitative: <5 m[IU]/mL (ref <5)

## 2016-11-07 LAB — LIPASE, BLOOD: Lipase: 29 U/L (ref 11–51)

## 2016-11-07 MED ORDER — SODIUM CHLORIDE 0.9 % IV BOLUS (SEPSIS)
1000.0000 mL | Freq: Once | INTRAVENOUS | Status: AC
  Administered 2016-11-07: 1000 mL via INTRAVENOUS

## 2016-11-07 MED ORDER — ALBUTEROL SULFATE (2.5 MG/3ML) 0.083% IN NEBU: 3.0000 mL | INHALATION_SOLUTION | RESPIRATORY_TRACT | Status: DC | PRN

## 2016-11-07 MED ORDER — METOPROLOL TARTRATE 50 MG PO TABS
50.0000 mg | ORAL_TABLET | Freq: Two times a day (BID) | ORAL | Status: DC
  Administered 2016-11-07 – 2016-11-08 (×3): 50 mg via ORAL
  Filled 2016-11-07 (×3): qty 1

## 2016-11-07 MED ORDER — IOPAMIDOL (ISOVUE-370) INJECTION 76%
INTRAVENOUS | Status: AC
  Filled 2016-11-07: qty 100

## 2016-11-07 MED ORDER — FERROUS SULFATE 325 (65 FE) MG PO TABS
325.0000 mg | ORAL_TABLET | Freq: Two times a day (BID) | ORAL | Status: DC
  Administered 2016-11-07 – 2016-11-08 (×2): 325 mg via ORAL
  Filled 2016-11-07 (×2): qty 1

## 2016-11-07 MED ORDER — RIVAROXABAN 15 MG PO TABS
15.0000 mg | ORAL_TABLET | Freq: Two times a day (BID) | ORAL | Status: DC
  Administered 2016-11-07 – 2016-11-08 (×3): 15 mg via ORAL
  Filled 2016-11-07 (×3): qty 1

## 2016-11-07 MED ORDER — DEXTROSE 5 % IV SOLN
1.0000 g | Freq: Once | INTRAVENOUS | Status: AC
  Administered 2016-11-07: 1 g via INTRAVENOUS
  Filled 2016-11-07: qty 10

## 2016-11-07 MED ORDER — FENTANYL CITRATE (PF) 100 MCG/2ML IJ SOLN
100.0000 ug | Freq: Once | INTRAMUSCULAR | Status: AC
  Administered 2016-11-07: 100 ug via INTRAVENOUS
  Filled 2016-11-07: qty 2

## 2016-11-07 MED ORDER — PROMETHAZINE HCL 25 MG PO TABS
25.0000 mg | ORAL_TABLET | Freq: Three times a day (TID) | ORAL | Status: DC | PRN
  Administered 2016-11-07: 25 mg via ORAL
  Filled 2016-11-07: qty 1

## 2016-11-07 MED ORDER — ACETAMINOPHEN 650 MG RE SUPP: 650.0000 mg | Freq: Four times a day (QID) | RECTAL | Status: DC | PRN

## 2016-11-07 MED ORDER — POLYETHYLENE GLYCOL 3350 17 G PO PACK: 17.0000 g | PACK | Freq: Every day | ORAL | Status: DC | PRN

## 2016-11-07 MED ORDER — NON FORMULARY
3.0000 mg | Freq: Every evening | Status: DC | PRN
Start: 2016-11-07 — End: 2016-11-07

## 2016-11-07 MED ORDER — PRAVASTATIN SODIUM 20 MG PO TABS
20.0000 mg | ORAL_TABLET | Freq: Every day | ORAL | Status: DC
Start: 2016-11-07 — End: 2016-11-08
  Administered 2016-11-07: 20 mg via ORAL
  Filled 2016-11-07: qty 1

## 2016-11-07 MED ORDER — METOCLOPRAMIDE HCL 10 MG PO TABS
10.0000 mg | ORAL_TABLET | Freq: Three times a day (TID) | ORAL | Status: DC
  Administered 2016-11-07 – 2016-11-08 (×5): 10 mg via ORAL
  Filled 2016-11-07 (×5): qty 1

## 2016-11-07 MED ORDER — MELATONIN 3 MG PO TABS
3.0000 mg | ORAL_TABLET | Freq: Every evening | ORAL | Status: DC | PRN
Start: 2016-11-07 — End: 2016-11-08
  Administered 2016-11-07: 3 mg via ORAL
  Filled 2016-11-07 (×2): qty 1

## 2016-11-07 MED ORDER — IOPAMIDOL (ISOVUE-370) INJECTION 76%
100.0000 mL | Freq: Once | INTRAVENOUS | Status: AC | PRN
  Administered 2016-11-07: 100 mL via INTRAVENOUS

## 2016-11-07 MED ORDER — TRAMADOL HCL 50 MG PO TABS
50.0000 mg | ORAL_TABLET | Freq: Four times a day (QID) | ORAL | Status: DC | PRN
  Administered 2016-11-07 – 2016-11-08 (×2): 50 mg via ORAL
  Filled 2016-11-07 (×2): qty 1

## 2016-11-07 MED ORDER — METOCLOPRAMIDE HCL 5 MG/ML IJ SOLN
5.0000 mg | Freq: Once | INTRAMUSCULAR | Status: AC
  Administered 2016-11-07: 5 mg via INTRAVENOUS
  Filled 2016-11-07: qty 2

## 2016-11-07 MED ORDER — DIPHENHYDRAMINE HCL 25 MG PO CAPS: 25.0000 mg | ORAL_CAPSULE | Freq: Four times a day (QID) | ORAL | Status: DC | PRN

## 2016-11-07 MED ORDER — ONDANSETRON HCL 4 MG/2ML IJ SOLN
4.0000 mg | Freq: Once | INTRAMUSCULAR | Status: AC
  Administered 2016-11-07: 4 mg via INTRAVENOUS
  Filled 2016-11-07: qty 2

## 2016-11-07 MED ORDER — PANTOPRAZOLE SODIUM 40 MG PO TBEC
40.0000 mg | DELAYED_RELEASE_TABLET | Freq: Every day | ORAL | Status: DC
Start: 2016-11-07 — End: 2016-11-08
  Administered 2016-11-07 – 2016-11-08 (×2): 40 mg via ORAL
  Filled 2016-11-07 (×2): qty 1

## 2016-11-07 MED ORDER — ACETAMINOPHEN 325 MG PO TABS: 650.0000 mg | ORAL_TABLET | Freq: Four times a day (QID) | ORAL | Status: DC | PRN

## 2016-11-07 MED ORDER — ONDANSETRON 4 MG PO TBDP: 8.0000 mg | ORAL_TABLET | Freq: Three times a day (TID) | ORAL | Status: DC | PRN

## 2016-11-07 MED ORDER — SODIUM CHLORIDE 0.9 % IV SOLN
INTRAVENOUS | Status: DC
  Administered 2016-11-07 – 2016-11-08 (×2): via INTRAVENOUS

## 2016-11-07 MED ORDER — ASPIRIN EC 81 MG PO TBEC
81.0000 mg | DELAYED_RELEASE_TABLET | Freq: Every day | ORAL | Status: DC
  Administered 2016-11-07 – 2016-11-08 (×2): 81 mg via ORAL
  Filled 2016-11-07 (×2): qty 1

## 2016-11-07 MED ORDER — INSULIN ASPART PROT & ASPART (70-30 MIX) 100 UNIT/ML ~~LOC~~ SUSP
15.0000 [IU] | Freq: Two times a day (BID) | SUBCUTANEOUS | Status: DC
  Administered 2016-11-07 – 2016-11-08 (×2): 15 [IU] via SUBCUTANEOUS
  Filled 2016-11-07: qty 10

## 2016-11-07 NOTE — ED Provider Notes (Signed)
[facility] DEPT Provider Note   CSN: [id number] Arrival date & time: 11/06/16  2301  By signing my name below, I, Margit Banda, attest that this documentation has been prepared under the direction and in the presence of Horton, Barbette Hair, MD. Electronically Signed: Margit Banda, ED Scribe. 11/07/16. 2:43 AM.  History   Chief Complaint Chief Complaint  Patient presents with  . Abdominal Pain  . Shortness of Breath    HPI Shirley [name] is a 41 y.o. female with a PMHx of CAD, CVA, UTI, and PE, who presents to the Emergency Department complaining of cramping, stabbing, abdominal pain that started on 11/04/16. She states her pain is "like a contraction without being pregnant." Associated sx include nausea, vomiting, urinary frequency and back pain. Nothing taken at home for pain. Pt was seen in the ED on 11/04/16 for similar sx and was dx with a UTI. She has yet to get her prescribed medication. Pt has had pain like this in the past, but it was never dx. She was  Pt is currently on her menstrual cycle. Recently had her depo shot. Took Xarelto today. Pt denies diarrhea, fever, and cough.  Of note, patient was seen and evaluated 2 days ago for similar symptoms. At that time she was felt to be low risk for worsening PE. Evaluation was largely unremarkable. She has also had 3 CT scans in the last 45. Most recently a CT scan noncontrasted stone study which was unremarkable on 7/14.Regarding UTI, urine culture reviewed and shows multiple organisms. Recommend repeat.  The history is provided by the patient and medical records. No language interpreter was used.    Past Medical History:  Diagnosis Date  . Abscess of tunica vaginalis    10/09- Abundant S. aureus- sensitive to all abx  . Anxiety   . Blood dyscrasia   . CAD (coronary artery disease) 06/15/2006   s/p Subendocardial MI with PDA angioplasty(no stent) on 06/15/06 and relook  cath 06/19/06 showed patency of site. Cath 12/10- no  restenosis or significant CAD progression  . CVA (cerebral vascular accident) ([place]) ~ 02/2014   denies residual on 04/22/2014  . CVA (cerebral vascular accident) [facility]    history of remote right cerebellar infarct noted on head CT at least since 10/2011  . Depression   . Diabetes mellitus type 2, uncontrolled, with complications ([place])   . Fibromyalgia   . Gastritis   . Gastroparesis    secondary to poorly controlled DM, last emptying study performed 01/2010  was normal but may be falsely positive as pt was on reglan  . GERD (gastroesophageal reflux disease)   . Hepatitis B, chronic (HCC)    Hep BeAb+,Hep B cAb+ & Hep BsAg+ (9/06)  . History of pyelonephritis    H/o GrpB Pyelonephritis (9/06) and UTI- 07/11- E.Coli, 12/10- GBS  . Hyperlipidemia   . Hypertension   . Iron deficiency anemia   . Irregular menses    Small ovarian follicles seen on GU(4/40)  . MI (myocardial infarction) ([place]) 05/2006   PDA percutaneous transluminal coronary angioplasty  . Migraine    "weekly" (04/22/2014)  . N&V (nausea and vomiting)    Chronic. Unclear etiology with multiple admission and ED visits. CT abdomen with and without contrast (02/2011)  showed no acute process. Gastic Emptying scan (01/2010) was normal. Ultrasound of the abdomen was within normal limits. Hepatitis B viral load was undectable. HIV NR. EGD - gastritis, Hpylori + s/p Rx  . Obesity   . OSA (  obstructive sleep apnea)    "suppose to wear mask but I don't" (04/22/2014)  . Peripheral neuropathy   . Pneumonia    "this is probably the 2nd or 3rd time I've had pneumonia" (04/22/2014)  . Recurrent boils   . Seasonal asthma   . Substance abuse   . Thrombocytosis ([place])    Hem/Onc suggested 2/2 chronic hepatits and/or iron deficiency anemia    Patient Active Problem List   Diagnosis Date Noted  . Bilateral pulmonary embolism ([place]) 10/30/2016  . Acute DVT (deep venous thrombosis) ([place]) 10/30/2016  . Pulmonary embolus ([place]) 10/30/2016    . Nausea and vomiting 09/23/2016  . Intractable nausea and vomiting 08/11/2016  . Type 2 diabetes mellitus with hyperglycemia ([place]) 04/07/2016  . Nausea & vomiting 04/07/2016  . Diabetic gastroparesis ([place]) 04/06/2016  . Non-intractable cyclical vomiting with nausea   . Dehydration 03/27/2015  . Intractable vomiting with nausea 11/11/2014  . Gastroparesis 11/11/2014  . Cough   . Hematemesis 10/13/2014  . DKA (diabetic ketoacidoses) ([place]) 10/13/2014  . Increased anion gap metabolic acidosis 58/52/7782  . Tachycardia 07/21/2014  . Abdominal pain, chronic, left lower quadrant   . Nausea with vomiting   . Diabetic gastroparesis associated with type 2 diabetes mellitus ([place]) 04/28/2014  . History of Helicobacter pylori infection 04/22/2014  . Vaginal discharge 02/18/2014  . Atypical chest pain 07/22/2013  . Unspecified constipation 07/21/2013  . Tinea corporis 07/21/2013  . Intractable vomiting 04/29/2013  . Dysmenorrhea 04/22/2013  . UTI (urinary tract infection) 07/15/2012  . Headache(784.0) 02/08/2012  . Health care maintenance 01/22/2012  . Chronic hepatitis B ([place]) 03/07/2011  . History of leukocytosis 04/06/2010  . THROMBOCYTOSIS 04/06/2010  . Polysubstance abuse 02/23/2010  . Iron deficiency anemia 11/22/2009  . PERIPHERAL NEUROPATHY 10/01/2009  . Hyperlipidemia 08/30/2009  . Diabetic polyneuropathy ([place]) 08/30/2009  . Hidradenitis (recurrent boils) 07/07/2008  . Depression 12/27/2007  . Abdominal pain, left lower quadrant 11/21/2007  . FIBROMYALGIA 10/30/2007  . BACK PAIN 04/01/2007  . OBSTRUCTIVE SLEEP APNEA 01/17/2007  . ANXIETY DEPRESSION 06/27/2006  . Chronic ischemic heart disease 06/15/2006  . OBESITY, MORBID 05/15/2006  . MIGRAINE HEADACHE 05/15/2006  . Asthma 05/15/2006  . Essential hypertension 01/16/2006  . IRREGULAR MENSTRUATION 01/16/2006  . PEDAL EDEMA 01/16/2006  . Poorly controlled type 2 diabetes mellitus with peripheral neuropathy ([place])  01/16/1989    Past Surgical History:  Procedure Laterality Date  . CESAREAN SECTION  1997  . CORONARY ANGIOPLASTY WITH STENT PLACEMENT  2008   "2 stents"  . ESOPHAGOGASTRODUODENOSCOPY N/A 04/23/2014   Procedure: ESOPHAGOGASTRODUODENOSCOPY (EGD);  Surgeon: Winfield Cunas., MD;  [facility];  Service: Endoscopy;  Laterality: N/A;    OB History    No data available       Home Medications    Prior to Admission medications   Medication Sig Start Date End Date Taking? Authorizing Provider  acetaminophen (TYLENOL) 500 MG tablet Take 500 mg by mouth every 6 (six) hours as needed for mild pain.    Yes [provider]  albuterol (PROVENTIL HFA;VENTOLIN HFA) 108 (90 Base) MCG/ACT inhaler Inhale 2 puffs into the lungs every 4 (four) hours as needed for wheezing or shortness of breath. 09/27/16  Yes Clent Demark, PA-C  aspirin EC 81 MG tablet Take 1 tablet (81 mg total) by mouth daily. 09/27/16  Yes Clent Demark, PA-C  cephALEXin (KEFLEX) 500 MG capsule Take 1 capsule (500 mg total) by mouth 3 (three) times daily. 11/05/16  Yes  [name], MD  clindamycin (CLINDAGEL) 1 % gel Apply topically 2 (two) times daily. 09/27/16  Yes Clent Demark, PA-C  ferrous sulfate 325 (65 FE) MG tablet Take 1 tablet (325 mg total) by mouth 2 (two) times daily with a meal. 09/05/16  Yes Clent Demark, PA-C  hydrochlorothiazide (HYDRODIURIL) 25 MG tablet Take 1 tablet (25 mg total) by mouth daily. Take on tablet in the morning. 09/27/16  Yes Clent Demark, PA-C  HYDROcodone-acetaminophen (NORCO/VICODIN) 5-325 MG tablet Take 1 tablet by mouth every 6 (six) hours as needed for moderate pain. 11/05/16  Yes [name], MD  insulin NPH-regular Human (NOVOLIN 70/30) (70-30) 100 UNIT/ML injection 25 units in the morning and 25 units in the evening. 09/27/16  Yes Clent Demark, PA-C  losartan (COZAAR) 50 MG tablet Take 1 tablet (50 mg total) by mouth daily. 09/27/16  Yes Clent Demark, PA-C  lovastatin (MEVACOR) 20 MG tablet Take 1 tablet (20 mg total) by mouth at bedtime. 09/27/16  Yes Clent Demark, PA-C  metoprolol tartrate (LOPRESSOR) 50 MG tablet Take 1 tablet (50 mg total) by mouth 2 (two) times daily. 09/27/16  Yes Clent Demark, PA-C  omeprazole (PRILOSEC) 40 MG capsule Take 1 capsule (40 mg total) by mouth daily. 09/27/16  Yes Clent Demark, PA-C  ondansetron (ZOFRAN ODT) 8 MG disintegrating tablet Take 1 tablet (8 mg total) by mouth every 8 (eight) hours as needed for nausea or vomiting. 11/05/16  Yes [name], MD  promethazine (PHENERGAN) 25 MG tablet Take 1 tablet (25 mg total) by mouth every 8 (eight) hours as needed for nausea or vomiting. 10/18/16  Yes Lawyer, Harrell Gave, PA-C  Rivaroxaban 15 & 20 MG TBPK Take as directed on package: Start with one 68m tablet by mouth twice a day with food. On Day 22, switch to one 247mtablet once a day with food. 10/24/16  Yes MoShary DecampPA-C  glucose blood (FREESTYLE TEST STRIPS) test strip Use as instructed 09/05/16   GoClent DemarkPA-C  glucose monitoring kit (FREESTYLE) monitoring kit 1 each by Does not apply route 4 (four) times daily - after meals and at bedtime. 1 month Diabetic Testing Supplies for QAC-QHS accuchecks. 09/05/16   GoClent DemarkPA-C  INSULIN SYRINGE .5CC/28G (INS SYRINGE/NEEDLE .5CC/28G) 28G X 1/2" 0.5 ML MISC Please provide 1 month supply 09/27/16   GoClent DemarkPA-C  Lancets (FREESTYLE) lancets Use as instructed 09/27/16   GoClent DemarkPA-C  metoCLOPramide (REGLAN) 10 MG tablet Take 1 tablet (10 mg total) by mouth 4 (four) times daily -  before meals and at bedtime. Patient not taking: Reported on 10/24/2016 09/27/16   GoClent DemarkPA-C  Rivaroxaban (XARELTO) 15 MG TABS tablet Take 1 tablet (15 mg total) by mouth 2 (two) times daily with a meal. Patient not taking: Reported on 11/05/2016 10/31/16 11/05/16  MeNeva SeatMD    Family History Family  History  Problem Relation Age of Onset  . Diabetes Father     Social History Social History  Substance Use Topics  . Smoking status: Former Smoker    Types: Cigarettes    Quit date: 04/24/1996  . Smokeless tobacco: Never Used     Comment: quit smoking cigarettes age 41. Alcohol use No     Comment: 04/22/2014 "might have a few drinks a month"     Allergies   Lisinopril and Morphine and related   Review of Systems Review  of Systems  Constitutional: Negative for fever.  Respiratory: Negative for cough.   Gastrointestinal: Positive for abdominal pain, nausea and vomiting. Negative for diarrhea.  Genitourinary: Positive for frequency.  Musculoskeletal: Positive for back pain.  All other systems reviewed and are negative.    Physical Exam Updated Vital Signs BP 124/83 (BP Location: Left Arm)   Pulse (!) 123   Temp 98.9 F (37.2 C) (Oral)   Resp 17   LMP 11/04/2016   SpO2 100%   Physical Exam  Constitutional: She is oriented to person, place, and time.  Anxious appearing, whole body shaking  HENT:  Head: Normocephalic and atraumatic.  Eyes: Pupils are equal, round, and reactive to light.  Cardiovascular: Regular rhythm and normal heart sounds.   Tachycardia  Pulmonary/Chest: Effort normal and breath sounds normal. No respiratory distress. She has no wheezes.  Abdominal: Soft. Bowel sounds are normal. There is tenderness. There is no rebound and no guarding.  Diffuse tenderness to palpation, no rebound or guarding  Neurological: She is alert and oriented to person, place, and time.  Skin: Skin is warm and dry.  Psychiatric: She has a normal mood and affect.  Nursing note and vitals reviewed.    ED Treatments / Results  DIAGNOSTIC STUDIES: Oxygen Saturation is 98% on RA, normal by my interpretation.   COORDINATION OF CARE: 2:43 AM-Discussed next steps with pt. Pt verbalized understanding and is agreeable with the plan.   Labs (all labs ordered are listed,  but only abnormal results are displayed) Labs Reviewed  COMPREHENSIVE METABOLIC PANEL - Abnormal; Notable for the following:       Result Value   Sodium 133 (*)    Chloride 100 (*)    Glucose, Bld 321 (*)    Creatinine, Ser 1.04 (*)    Albumin 3.4 (*)    ALT 9 (*)    All other components within normal limits  CBC - Abnormal; Notable for the following:    WBC 14.0 (*)    Hemoglobin 10.6 (*)    HCT 32.5 (*)    MCV 67.4 (*)    MCH 22.0 (*)    RDW 20.5 (*)    Platelets 578 (*)    All other components within normal limits  URINALYSIS, ROUTINE W REFLEX MICROSCOPIC - Abnormal; Notable for the following:    Color, Urine RED (*)    APPearance TURBID (*)    Glucose, UA   (*)    Value: TEST NOT REPORTED DUE TO COLOR INTERFERENCE OF URINE PIGMENT   Hgb urine dipstick   (*)    Value: TEST NOT REPORTED DUE TO COLOR INTERFERENCE OF URINE PIGMENT   Bilirubin Urine   (*)    Value: TEST NOT REPORTED DUE TO COLOR INTERFERENCE OF URINE PIGMENT   Ketones, ur   (*)    Value: TEST NOT REPORTED DUE TO COLOR INTERFERENCE OF URINE PIGMENT   Protein, ur   (*)    Value: TEST NOT REPORTED DUE TO COLOR INTERFERENCE OF URINE PIGMENT   Nitrite   (*)    Value: TEST NOT REPORTED DUE TO COLOR INTERFERENCE OF URINE PIGMENT   Leukocytes, UA   (*)    Value: TEST NOT REPORTED DUE TO COLOR INTERFERENCE OF URINE PIGMENT   Bacteria, UA MANY (*)    Squamous Epithelial / LPF 0-5 (*)    All other components within normal limits  URINE CULTURE  LIPASE, BLOOD  I-STAT BETA HCG BLOOD, ED ([facility], [facility], [facility] ONLY)  EKG  EKG Interpretation  Date/Time:  Monday November 06 2016 23:08:56 EDT Ventricular Rate:  133 PR Interval:  116 QRS Duration: 78 QT Interval:  314 QTC Calculation: 467 R Axis:   66 Text Interpretation:  Sinus tachycardia Otherwise normal ECG Confirmed by Thayer Jew [phone]) on 11/07/2016 3:08:47 AM       Radiology Dg Chest 2 View  Result Date: 11/07/2016 CLINICAL DATA:  Right upper chest pain  and dyspnea tonight. Nausea and vomiting. EXAM: CHEST  2 VIEW COMPARISON:  11/04/2016 CXR FINDINGS: The heart size and mediastinal contours are within normal limits. Both lungs are clear. The visualized skeletal structures are unremarkable. Braid artifacts project over both shoulders. IMPRESSION: No active cardiopulmonary disease. Electronically Signed   By: Ashley Royalty M.D.   On: 11/07/2016 00:39   Ct Angio Chest Pe W And/or Wo Contrast  Result Date: 11/07/2016 CLINICAL DATA:  41 year old female with shortness of breath. History of pulmonary emboli. EXAM: CT ANGIOGRAPHY CHEST WITH CONTRAST TECHNIQUE: Multidetector CT imaging of the chest was performed using the standard protocol during bolus administration of intravenous contrast. Multiplanar CT image reconstructions and MIPs were obtained to evaluate the vascular anatomy. CONTRAST:  100 cc Isovue 370 COMPARISON:  Chest radiograph dated 11/07/2016 and chest CT dated 10/30/2016 FINDINGS: Cardiovascular: There is no cardiomegaly or pericardial effusion. The thoracic aorta is unremarkable. The origins of the great vessels of the aortic arch appear patent. Evaluation of the pulmonary artery is limited due to suboptimal opacification and timing of the contrast. Bilateral pulmonary emboli again noted which appears stable and similar to the prior CT. Mediastinum/Nodes: There is no hilar or mediastinal adenopathy. The esophagus and the thyroid gland are grossly unremarkable. Lungs/Pleura: Left upper lobe cylindrical bronchiectatic changes and scarring as seen on the prior CT. There is no focal consolidation, pleural effusion, or pneumothorax. The central airways are patent. Upper Abdomen: No acute abnormality. Musculoskeletal: No chest wall abnormality. No acute or significant osseous findings. Review of the MIP images confirms the above findings. IMPRESSION: Bilateral pulmonary artery emboli similar to prior CT and with no significant interval change. Evaluation of  the pulmonary arteries is limited due to suboptimal opacification and timing of the contrast. No new intrathoracic pathology identified. Electronically Signed   By: Anner Crete M.D.   On: 11/07/2016 05:49   US Pelvis Complete  Result Date: 11/07/2016 CLINICAL DATA:  41 year old female with pelvic pain. EXAM: TRANSABDOMINAL ULTRASOUND OF PELVIS TECHNIQUE: Transabdominal ultrasound examination of the pelvis was performed including evaluation of the uterus, ovaries, adnexal regions, and pelvic cul-de-sac. COMPARISON:  Abdominal CT dated 11/05/2016 FINDINGS: Uterus Measurements: The uterus is anteverted and measures 13.6 x 7.2 x 7.5 cm. There is a 6.5 x 5.7 x 6.4 cm posterior body ill-defined intramural fibroid. Endometrium Thickness: 3 mm. The endometrium is poorly visualized and not seen in its entirety. There is apparent anterior displacement of the endometrium by the fibroid. Right ovary Measurements: 2.5 x 1.3 x 2.3 cm. Normal appearance/no adnexal mass. Left ovary Measurements: 2.3 x 1.3 x 2.5 cm. Normal appearance/no adnexal mass. Other findings:  No abnormal free fluid. IMPRESSION: 1. Large posterior body intramural fibroid with mass effect and anterior displacement of the endometrium. 2. Poorly visualized endometrium with incomplete evaluation. 3. Unremarkable ovaries. Electronically Signed   By: Anner Crete M.D.   On: 11/07/2016 05:26    Procedures Procedures (including critical care time)  Medications Ordered in ED Medications  cefTRIAXone (ROCEPHIN) 1 g in dextrose 5 % 50 mL IVPB (  not administered)  ondansetron (ZOFRAN-ODT) 4 MG disintegrating tablet (4 mg  Given 11/06/16 2342)  ondansetron (ZOFRAN-ODT) disintegrating tablet 4 mg (4 mg Oral Given 11/07/16 0250)  ondansetron (ZOFRAN) injection 4 mg (4 mg Intravenous Given 11/07/16 0400)  fentaNYL (SUBLIMAZE) injection 100 mcg (100 mcg Intravenous Given 11/07/16 0355)  sodium chloride 0.9 % bolus 1,000 mL (0 mLs Intravenous Stopped  11/07/16 0536)  iopamidol (ISOVUE-370) 76 % injection 100 mL (100 mLs Intravenous Contrast Given 11/07/16 0513)  fentaNYL (SUBLIMAZE) injection 100 mcg (100 mcg Intravenous Given 11/07/16 0559)  metoCLOPramide (REGLAN) injection 5 mg (5 mg Intravenous Given 11/07/16 4888)     Initial Impression / Assessment and Plan / ED Course  I have reviewed the triage vital signs and the nursing notes.  Pertinent labs & imaging results that were available during my care of the patient were reviewed by me and considered in my medical decision making (see chart for details).  Clinical Course as of Nov 07 728  Tue Nov 07, 2016  0559 Workup notable for leukocytosis. Persistent vomiting.  Patient given Reglan given history of gastroparesis. Abdominal ultrasound with uterine fibroid. Doubt this is the etiology of her symptoms. Basic labwork is reassuring. CT angios chest shows stable PE burden.  [CH]    Clinical Course User Index [CH] Horton, Barbette Hair, MD    Patient presents with abdominal pain, vomiting, shortness of breath, back pain. She is tachycardic on exam. Afebrile. She is uncomfortable appearing. She is given fluids and pain and nausea medication. Workup notable for leukocytosis to 14 which is somewhat increased. CT angioma was obtained given significance of tachycardia. No increase PE burden. Abdominal ultrasound obtained and just shows endometrial fibroids. Patient has had persistent pain and vomiting while in the emergency department. It took several hours to obtain urine. Urinalysis is difficult to interpret but shows too numerous to count red cells, white cells, and many bacteria. Urine culture was sent. Patient was given IV Rocephin. Previous cultures have been pansensitive Escherichia coli. Given constellation of symptoms, feel she may have pyelonephritis. She remained afebrile. Tachycardia improved to the low 100s. Given persistent symptoms, will admit for symptom management.  Final Clinical  Impressions(s) / ED Diagnoses   Final diagnoses:  Lower abdominal pain  Pyelonephritis  Intractable vomiting with nausea, unspecified vomiting type    New Prescriptions New Prescriptions   No medications on file   I personally performed the services described in this documentation, which was scribed in my presence. The recorded information has been reviewed and is accurate.    Merryl Hacker, MD 11/07/16 [phone]

## 2016-11-07 NOTE — ED Notes (Signed)
Lab at bedside

## 2016-11-07 NOTE — ED Notes (Signed)
Pt notified if pt cannot urinate in 15 minute pt will be in and out cathed.

## 2016-11-07 NOTE — ED Notes (Signed)
Report called to 5 [address]  Room 4

## 2016-11-07 NOTE — ED Notes (Signed)
Pt able to keep down fluids with no problem.

## 2016-11-07 NOTE — ED Notes (Signed)
Pt states still feeling really nauseous give total 8mg  of zofran at this time

## 2016-11-07 NOTE — ED Notes (Signed)
Attempted IV unsuccessful x2

## 2016-11-07 NOTE — H&P (Signed)
Date: 11/07/2016               Patient Name:  Shirley [name] MRN: [id number]  DOB: 29-Nov-1975 Age / Sex: 41 y.o., female   PCP: Clent Demark, PA-C         Medical Service: Internal Medicine Teaching Service         Attending Physician: Dr. Merryl Hacker, MD    First Contact: Dr. Trilby Drummer Pager: [phone]  Second Contact: Dr. Benjamine Mola Pager: [phone]       After Hours (After 5p/  First Contact Pager: [phone]  weekends / holidays): Second Contact Pager: [phone]   Chief Complaint: Abdominal pain and vomiting  History of Present Illness:  This is a 41 y.o. 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  Symptoms began the day after her recent ED visit on 7/15 when she started throwing up non-bloody, non-bilious emesis.  This was asssociated with right lower abdominal pain.  Described as cramping and stabbing, "like a contraction".  The pain has been constant.  She also describes intermittent shortness of breath and chest pain that have both resolved themselves.  She reports that nothing makes her symptoms better and nothting makes her symptoms worse.  She denies fever or chills.  She denies diarrhea or dysuria.  She does report nausea, urinary frequency, urgency, and low back pain. Her last bowel movement was yesterday.  She has not eaten well in about 2 days but is agreeable to trying to have a more normal diet now.  She is unsure if the pain in her abdomen causes her gastroparesis to flare up but thinks it may be related.  Her sugars are running in the 300s which is normal for her.  She also reports having a heavy menstrual cycle that started 2-3 days ago.  She was seen in the ED on 7/14, diagnosed with a UTI and discharged home on Keflex.  She did not pick up this prescription.  She reports that she has been  adherent with her Xarelto except for 1 day of missed dosing.    In the ED, she was tachycardic, afebrile, and uncomfortable appearing.  She was given IV fluids and pain medication.  CTA chest was obtained by EDP given hx of PE and did not show an increased PE burden.  Abdominal US showed endometrial fibroids.  UA difficult to interpret but did show TNTC RBC, WBC, and many bacteria.  0-5 squamous epithelial cells.  Culture was sent and she was given ceftriaxone given previous UCx sensitivities.  [facility] was thus called for admission.   Meds:  Current Meds  Medication Sig  . acetaminophen (TYLENOL) 500 MG tablet Take 500 mg by mouth every 6 (six) hours as needed for mild pain.   Marland Kitchen albuterol (PROVENTIL HFA;VENTOLIN HFA) 108 (90 Base) MCG/ACT inhaler Inhale 2 puffs into the lungs every 4 (four) hours as needed for wheezing or shortness of breath.  Marland Kitchen aspirin EC 81 MG tablet Take 1 tablet (81 mg total) by mouth daily.  . cephALEXin (KEFLEX) 500 MG capsule Take 1 capsule (500 mg total) by mouth 3 (three) times daily.  . clindamycin (CLINDAGEL) 1 % gel Apply topically 2 (two) times daily.  . ferrous sulfate 325 (65 FE) MG tablet Take 1 tablet (325 mg total) by mouth  2 (two) times daily with a meal.  . hydrochlorothiazide (HYDRODIURIL) 25 MG tablet Take 1 tablet (25 mg total) by mouth daily. Take on tablet in the morning.  Marland Kitchen HYDROcodone-acetaminophen (NORCO/VICODIN) 5-325 MG tablet Take 1 tablet by mouth every 6 (six) hours as needed for moderate pain.  Marland Kitchen insulin NPH-regular Human (NOVOLIN 70/30) (70-30) 100 UNIT/ML injection 25 units in the morning and 25 units in the evening.  Marland Kitchen losartan (COZAAR) 50 MG tablet Take 1 tablet (50 mg total) by mouth daily.  Marland Kitchen lovastatin (MEVACOR) 20 MG tablet Take 1 tablet (20 mg total) by mouth at bedtime.  . metoprolol tartrate (LOPRESSOR) 50 MG tablet Take 1 tablet (50 mg total) by mouth 2 (two) times daily.  Marland Kitchen omeprazole (PRILOSEC) 40 MG capsule Take 1 capsule (40 mg total)  by mouth daily.  . ondansetron (ZOFRAN ODT) 8 MG disintegrating tablet Take 1 tablet (8 mg total) by mouth every 8 (eight) hours as needed for nausea or vomiting.  . promethazine (PHENERGAN) 25 MG tablet Take 1 tablet (25 mg total) by mouth every 8 (eight) hours as needed for nausea or vomiting.  . Rivaroxaban 15 & 20 MG TBPK Take as directed on package: Start with one 15mg  tablet by mouth twice a day with food. On Day 22, switch to one 20mg  tablet once a day with food.     Allergies: Allergies as of 11/06/2016 - Review Complete 11/06/2016  Allergen Reaction Noted  . Lisinopril Nausea Only and Swelling 03/17/2008  . Morphine and related Itching and Swelling 04/15/2013   Past Medical History:  Diagnosis Date  . Abscess of tunica vaginalis    10/09- Abundant S. aureus- sensitive to all abx  . Anxiety   . Blood dyscrasia   . CAD (coronary artery disease) 06/15/2006   s/p Subendocardial MI with PDA angioplasty(no stent) on 06/15/06 and relook  cath 06/19/06 showed patency of site. Cath 12/10- no restenosis or significant CAD progression  . CVA (cerebral vascular accident) ([place]) ~ 02/2014   denies residual on 04/22/2014  . CVA (cerebral vascular accident) [facility])    history of remote right cerebellar infarct noted on head CT at least since 10/2011  . Depression   . Diabetes mellitus type 2, uncontrolled, with complications ([place])   . Fibromyalgia   . Gastritis   . Gastroparesis    secondary to poorly controlled DM, last emptying study performed 01/2010  was normal but may be falsely positive as pt was on reglan  . GERD (gastroesophageal reflux disease)   . Hepatitis B, chronic (HCC)    Hep BeAb+,Hep B cAb+ & Hep BsAg+ (9/06)  . History of pyelonephritis    H/o GrpB Pyelonephritis (9/06) and UTI- 07/11- E.Coli, 12/10- GBS  . Hyperlipidemia   . Hypertension   . Iron deficiency anemia   . Irregular menses    Small ovarian follicles seen on ZO(1/09)  . MI (myocardial infarction) ([place])  05/2006   PDA percutaneous transluminal coronary angioplasty  . Migraine    "weekly" (04/22/2014)  . N&V (nausea and vomiting)    Chronic. Unclear etiology with multiple admission and ED visits. CT abdomen with and without contrast (02/2011)  showed no acute process. Gastic Emptying scan (01/2010) was normal. Ultrasound of the abdomen was within normal limits. Hepatitis B viral load was undectable. HIV NR. EGD - gastritis, Hpylori + s/p Rx  . Obesity   . OSA (obstructive sleep apnea)    "suppose to wear mask but I don't" (04/22/2014)  .  Peripheral neuropathy   . Pneumonia    "this is probably the 2nd or 3rd time I've had pneumonia" (04/22/2014)  . Recurrent boils   . Seasonal asthma   . Substance abuse   . Thrombocytosis (HCC)    Hem/Onc suggested 2/2 chronic hepatits and/or iron deficiency anemia    Family History: mother with DM, Grandfather with MI  Social History: Denies tobacco, social drinker, remote history of drug use, has a boyfriend  Review of Systems: A complete ROS was negative except as per HPI.   Physical Exam: Blood pressure 137/87, pulse (!) 117, temperature 98.9 F (37.2 C), temperature source Oral, resp. rate 16, last menstrual period 11/04/2016, SpO2 99 %. Physical Exam  Constitutional: She is oriented to person, place, and time and well-developed, well-nourished, and in no distress.  Comfortable appearing, lying flat in bed.  HENT:  Head: Normocephalic and atraumatic.  Eyes: Conjunctivae and EOM are normal.  Neck: Normal range of motion. Neck supple.  Pulmonary/Chest: Effort normal. No respiratory distress.  Abdominal: Soft. She exhibits no distension. There is tenderness. There is no rebound and no guarding.  Tender in her RLQ.   No suprapubic tenderness.  Musculoskeletal: She exhibits edema and tenderness.  She has paraspinal muscle tenderness of her lumbar spine right greater than left. No CVA tenderness. Trace lower extremity edema right greater than  left.  Neurological: She is alert and oriented to person, place, and time. No cranial nerve deficit.  Skin: Skin is warm and dry.  Psychiatric: Mood and affect normal.     EKG: personally reviewed my interpretation is rate 133, sinus tachycardia.  CXR: personally reviewed my interpretation is heart size is normal.  Lungs clear.  Unremarkable skeletal structures.  CTA CHEST : IMPRESSION: Bilateral pulmonary artery emboli similar to prior CT and with no significant interval change. Evaluation of the pulmonary arteries is limited due to suboptimal opacification and timing of the contrast. No new intrathoracic pathology identified.  US Pelvis:  IMPRESSION: 1. Large posterior body intramural fibroid with mass effect and anterior displacement of the endometrium. 2. Poorly visualized endometrium with incomplete evaluation. 3. Unremarkable ovaries.  CT Renal Stone 7/15 IMPRESSION: 1. Negative for nephrolithiasis or hydronephrosis. Negative for ureteral stone. 2. Negative appendix 3. Prominent globular appearing uterus as before.  LABS: Na 133, K 3.8, Cl 100, CO2 23, BUN 12, Creat 1.04, Gluc 321, anion gap 10 AST 20, ALT 9, AP 75, TBili 0.7 WBC 14, Hgb 10.6, HCT 32, Plt 578, MCV 67 Beta hCG <5 UA is red in color, turbid, TNTC RBC, TNTC WBC, many bacteria, 0-5 squamous epithelial cells UCx sent  Assessment & Plan by Problem:  Abdominal Pain Possible UTI vs Asymptomatic Bacteruria Her UA is suggestive of possible urinary tract infection with tachycardia and leukocytosis as well.  Her abdominal pain and back pain, however, does not appear to be entirely consistent with UTI or pyelonephritis and may be due in part to her chronic gastroparesis abdominal pain and possibly mass effect from her uterine fibroid seen on ultrasound.  Her mild leukocytosis and tachycardia can also be due to several days of vomiting and volume depletion.  Her urinary frequency can be due to osmotic diuresis from  uncontrolled hyperglycemia.  She was previously treated with 3 days of Levaquin from an ED visit earlier this month with resolution of symptoms.  She was seen in the ED 7/14 and prescribed Keflex but did not obtain this medication.  The UA from this date was negative nitrites and  leukocytes.  She has been given Ceftriaxone in the ED x 1 dose. - Has received ceftriaxone x 1 in the ED.  Will hold off further antibiotics.  Follow WBC trend and fever curve - Control blood sugars - Advance diet as tolerated - IV fluid hydration - Anti-emetics - Check urine GC/CT - Tylenol as needed  Fibroids Outpatient Ob/Gyn referral  PE DVT Fortunately CTA chest does not show worsening clot burden. - Continue Xarelto  DM Gastroparesis Glucose via CMET is > 300.  Her elevated sugars and gastroparesis may be driving a lot of her current symptomology.  Per chart review, gastric emptying study done in 2011 was normal but could be falsely negative as patient was on Reglan.  - Sliding scale with HS coverage - Novolin 70/30 15 units BID - Phenergan as needed - Zofran as needed - Resume Reglan 10mg  QID  CAD s/p MI HTN HLD Her EKG is non-ischemic and she is currently normotensive in the ED. - ASA 81mg  daily - Statin  - Metoprolol 50mg  daily - Hold Losartan and HCTZ  Iron Deficiency Anemia Hemoglobin stable.  Iron of 20, ferritin 5, saturation ratio of 4 in April 2018. - Continue ferrous sulfate 325mg  BID  FEN Fluids: NS at 125cc/hr Electrolytes: Replace as needed Nutrition: Heart Healthy  DVT PPx:  Xarelto as above  CODE: FULL  Dispo: Admit patient to Observation with expected length of stay less than 2 midnights.  SignedJule Ser, DO 11/07/2016, 8:24 AM  Pager: [phone]

## 2016-11-08 LAB — CBC
HCT: 25.6 % — ABNORMAL LOW (ref 36.0–46.0)
HEMOGLOBIN: 8.5 g/dL — AB (ref 12.0–15.0)
MCH: 22.3 pg — AB (ref 26.0–34.0)
MCHC: 33.2 g/dL (ref 30.0–36.0)
MCV: 67 fL — AB (ref 78.0–100.0)
Platelets: 424 10*3/uL — ABNORMAL HIGH (ref 150–400)
RBC: 3.82 MIL/uL — AB (ref 3.87–5.11)
RDW: 20.2 % — ABNORMAL HIGH (ref 11.5–15.5)
WBC: 11.2 10*3/uL — ABNORMAL HIGH (ref 4.0–10.5)

## 2016-11-08 LAB — BASIC METABOLIC PANEL
Anion gap: 7 (ref 5–15)
BUN: 10 mg/dL (ref 6–20)
CHLORIDE: 105 mmol/L (ref 101–111)
CO2: 21 mmol/L — ABNORMAL LOW (ref 22–32)
Calcium: 8.2 mg/dL — ABNORMAL LOW (ref 8.9–10.3)
Creatinine, Ser: 0.92 mg/dL (ref 0.44–1.00)
GFR calc Af Amer: >60 mL/min (ref >60)
GFR calc non Af Amer: >60 mL/min (ref >60)
GLUCOSE: 309 mg/dL — AB (ref 65–99)
POTASSIUM: 3.8 mmol/L (ref 3.5–5.1)
Sodium: 133 mmol/L — ABNORMAL LOW (ref 135–145)

## 2016-11-08 LAB — HIV ANTIBODY (ROUTINE TESTING W REFLEX): HIV Screen 4th Generation wRfx: NONREACTIVE

## 2016-11-08 LAB — GLUCOSE, CAPILLARY: Glucose-Capillary: 252 mg/dL — ABNORMAL HIGH (ref 65–99)

## 2016-11-08 LAB — URINE CULTURE

## 2016-11-08 NOTE — Discharge Summary (Signed)
Name: Shirley [name] MRN: [id number] DOB: [birth year] 41 y.o. PCP: Shirley [name]  Date of Admission: 11/07/2016  2:03 AM Date of Discharge: 11/08/2016 Attending Physician: Lucious Groves, DO  Discharge Diagnosis:  Principal Problem:   Abdominal pain   Discharge Medications: Allergies as of 11/08/2016      Reactions   Lisinopril Nausea Only, Swelling   Morphine And Related Itching, Swelling      Medication List    TAKE these medications   acetaminophen 500 MG tablet Commonly known as:  TYLENOL Take 500 mg by mouth every 6 (six) hours as needed for mild pain.   albuterol 108 (90 Base) MCG/ACT inhaler Commonly known as:  PROVENTIL HFA;VENTOLIN HFA Inhale 2 puffs into the lungs every 4 (four) hours as needed for wheezing or shortness of breath.   aspirin EC 81 MG tablet Take 1 tablet (81 mg total) by mouth daily.   clindamycin 1 % gel Commonly known as:  CLINDAGEL Apply topically 2 (two) times daily.   ferrous sulfate 325 (65 FE) MG tablet Take 1 tablet (325 mg total) by mouth 2 (two) times daily with a meal.   freestyle lancets Use as instructed   glucose blood test strip Commonly known as:  FREESTYLE TEST STRIPS Use as instructed   glucose monitoring kit monitoring kit 1 each by Does not apply route 4 (four) times daily - after meals and at bedtime. 1 month Diabetic Testing Supplies for QAC-QHS accuchecks.   hydrochlorothiazide 25 MG tablet Commonly known as:  HYDRODIURIL Take 1 tablet (25 mg total) by mouth daily. Take on tablet in the morning.   insulin NPH-regular Human (70-30) 100 UNIT/ML injection Commonly known as:  NOVOLIN 70/30 25 units in the morning and 25 units in the evening.   INSULIN SYRINGE .5CC/28G 28G X 1/2" 0.5 ML Misc Commonly known as:  INS SYRINGE/NEEDLE .5CC/28G Please provide 1 month supply   losartan 50 MG tablet Commonly known as:  COZAAR Take 1 tablet (50 mg total) by mouth daily.   lovastatin 20 MG  tablet Commonly known as:  MEVACOR Take 1 tablet (20 mg total) by mouth at bedtime.   metoCLOPramide 10 MG tablet Commonly known as:  REGLAN Take 1 tablet (10 mg total) by mouth 4 (four) times daily -  before meals and at bedtime.   metoprolol tartrate 50 MG tablet Commonly known as:  LOPRESSOR Take 1 tablet (50 mg total) by mouth 2 (two) times daily.   omeprazole 40 MG capsule Commonly known as:  PRILOSEC Take 1 capsule (40 mg total) by mouth daily.   ondansetron 8 MG disintegrating tablet Commonly known as:  ZOFRAN ODT Take 1 tablet (8 mg total) by mouth every 8 (eight) hours as needed for nausea or vomiting.   promethazine 25 MG tablet Commonly known as:  PHENERGAN Take 1 tablet (25 mg total) by mouth every 8 (eight) hours as needed for nausea or vomiting.   Rivaroxaban 15 & 20 MG Tbpk Take as directed on package: Start with one 79m tablet by mouth twice a day with food. On Day 22, switch to one 255mtablet once a day with food.   Rivaroxaban 15 MG Tabs tablet Commonly known as:  XARELTO Take 1 tablet (15 mg total) by mouth 2 (two) times daily with a meal.       Disposition and follow-up:   [name]  At the hospital follow up  visit please address:  1. Abdominal pain: Please monitor for changes in the patient's chronic pain. The patient also needs to follow up with OB/GYN concerning her fibroids seen on ultrasound, but she does not have insurance and will need assistance in arranging this appointment.  2. Diabetes: Please work with the patient to improve her glucose control.  3. Other: Ensure medication adherence, especially with respect to her Xarelto.  4.  Labs / imaging needed at time of follow-up: None  5.  Pending labs/ test needing follow-up: None  Follow-up Appointments:   Hospital Course by problem list:   1. Abdominal pain: Shirley [name] presented to the emergency department  with complaints of abdominal pain and vomiting. These are chronic complaints for her, for which she had been seen previously. She described a cramping abdominal pain without fever or chills. She had not eaten well for 2 days. She felt the pain may be related to her gastroparesis. EKG showed tachycardia. She also reported a heavy menstrual cycle starting 2-3 days prior (in the setting of anticoagulation for recent DVT and PE.) Abdominal ultrasound showed endometrial fibroids. A UA was performed showing too numerous to count red blood cells but this was difficult to interpret in the setting of her current heavy menstrual period. She had been to the ED 3 days earlier with complaints of abdominal pain and UA, at that time, was suspicious for pyuria. She was prescribed Keflex, however she had not picked up this prescription from the pharmacy. On her current presentation, she denied dysuria but endorsed urinary frequency and urgency (in the setting of consistently elevated blood glucose.) She improved overnight on supportive therapy: Phenergan, Zofran, Reglan, IVF, tramadol. She felt better in the morning and was hoping to make it to an appointment regarding finding new housing. She was instructed to pick up her previous keflex prescription upon discharge.  Discharge Vitals:   BP 114/70 (BP Location: Left Arm)   Pulse 89   Temp 98.7 F (37.1 C) (Oral)   Resp 18   Ht 5' 7"  (1.702 m)   Wt 207 lb (93.9 kg)   LMP 11/04/2016   SpO2 100%   BMI 32.42 kg/m   Pertinent Labs, Studies, and Procedures:  CBC Latest Ref Rng & Units 11/08/2016 11/06/2016 11/04/2016  WBC 4.0 - 10.5 K/uL 11.2(H) 14.0(H) 12.3(H)  Hemoglobin 12.0 - 15.0 g/dL 8.5(L) 10.6(L) 10.9(L)  Hematocrit 36.0 - 46.0 % 25.6(L) 32.5(L) 32.5(L)  Platelets 150 - 400 K/uL 424(H) 578(H) 528(H)   BMP Latest Ref Rng & Units 11/08/2016 11/06/2016 11/04/2016  Glucose 65 - 99 mg/dL 309(H) 321(H) 423(H)  BUN 6 - 20 mg/dL 10 12 14   Creatinine 0.44 - 1.00 mg/dL  0.92 1.04(H) 1.13(H)  Sodium 135 - 145 mmol/L 133(L) 133(L) 131(L)  Potassium 3.5 - 5.1 mmol/L 3.8 3.8 3.9  Chloride 101 - 111 mmol/L 105 100(L) 99(L)  CO2 22 - 32 mmol/L 21(L) 23 22  Calcium 8.9 - 10.3 mg/dL 8.2(L) 9.5 9.7   EKG: Sinus Tachycardia  US Pelvis: IMPRESSION: 1. Large posterior body intramural fibroid with mass effect and anterior displacement of the endometrium. 2. Poorly visualized endometrium with incomplete evaluation. 3. Unremarkable ovaries.  Urinalysis:  Reference Range   Color, Urine YELLOW RED -   APPearance CLEAR TURBID -   Specific Gravity, Urine 1.005 - 1.030 TEST NOT REPORTED DUE T...   pH 5.0 - 8.0 TEST NOT REPORTED DUE T...   Glucose, UA NEGATIVE mg/dL TEST NOT REPORTED DU... -   Hgb  urine dipstick NEGATIVE TEST NOT REPORTED DU... -   Bilirubin Urine NEGATIVE TEST NOT REPORTED DU... -   Ketones, ur NEGATIVE mg/dL TEST NOT REPORTED DU... -   Protein, ur NEGATIVE mg/dL TEST NOT REPORTED DU... -   Nitrite NEGATIVE TEST NOT REPORTED DU... -   Leukocytes, UA NEGATIVE TEST NOT REPORTED DU... -   RBC / HPF 0 - 5 RBC/hpf TOO NUMEROUS TO COUNT   WBC, UA 0 - 5 WBC/hpf TOO NUMEROUS TO COUNT   Bacteria, UA NONE SEEN MANY -   Squamous Epithelial / LPF NONE SEEN 0-5 -   Mucous  PRESENT      Discharge Instructions: Discharge Instructions    Call MD for:  persistant nausea and vomiting    Complete by:  As directed    Call MD for:  severe uncontrolled pain    Complete by:  As directed    Call MD for:  temperature >100.4    Complete by:  As directed    Diet - low sodium heart healthy    Complete by:  As directed    Discharge instructions    Complete by:  As directed    Please make a follow up appointment with your Primary care family medicine doctor - Talk with them about a referral to an OB/GYN to evaluate the fibroids in your uterus seen on imaging - Also talk with them about your recent hospital stays and progress with controlling you blood  sugar.  Please pick up the prescription for the antibiotic, Keflex that you were previously given for UTI  Please take your Xarelto and other medications as prescribed  Thank you for allowing [place] to care for you   Increase activity slowly    Complete by:  As directed       Signed: Neva Seat, DO IM PGY-1 Pager: [phone]

## 2016-11-11 ENCOUNTER — Emergency Department (HOSPITAL_COMMUNITY): Payer: Medicaid Other

## 2016-11-11 ENCOUNTER — Inpatient Hospital Stay (HOSPITAL_COMMUNITY)
Admission: EM | Admit: 2016-11-11 | Discharge: 2016-11-18 | DRG: 760 | Disposition: A | Discharge status: Home / Self Care | Payer: Self-pay | Attending: Internal Medicine | Admitting: Internal Medicine

## 2016-11-11 ENCOUNTER — Encounter (HOSPITAL_COMMUNITY): Payer: Self-pay | Admitting: Emergency Medicine

## 2016-11-11 DIAGNOSIS — Z86718 Personal history of other venous thrombosis and embolism: Secondary | ICD-10-CM

## 2016-11-11 DIAGNOSIS — E1165 Type 2 diabetes mellitus with hyperglycemia: Secondary | ICD-10-CM | POA: Diagnosis present

## 2016-11-11 DIAGNOSIS — K219 Gastro-esophageal reflux disease without esophagitis: Secondary | ICD-10-CM | POA: Diagnosis present

## 2016-11-11 DIAGNOSIS — R1115 Cyclical vomiting syndrome unrelated to migraine: Secondary | ICD-10-CM

## 2016-11-11 DIAGNOSIS — D509 Iron deficiency anemia, unspecified: Secondary | ICD-10-CM | POA: Diagnosis present

## 2016-11-11 DIAGNOSIS — E669 Obesity, unspecified: Secondary | ICD-10-CM | POA: Diagnosis present

## 2016-11-11 DIAGNOSIS — I10 Essential (primary) hypertension: Secondary | ICD-10-CM | POA: Diagnosis present

## 2016-11-11 DIAGNOSIS — G4733 Obstructive sleep apnea (adult) (pediatric): Secondary | ICD-10-CM | POA: Diagnosis present

## 2016-11-11 DIAGNOSIS — N920 Excessive and frequent menstruation with regular cycle: Secondary | ICD-10-CM | POA: Diagnosis present

## 2016-11-11 DIAGNOSIS — Z794 Long term (current) use of insulin: Secondary | ICD-10-CM

## 2016-11-11 DIAGNOSIS — Z86711 Personal history of pulmonary embolism: Secondary | ICD-10-CM

## 2016-11-11 DIAGNOSIS — I2510 Atherosclerotic heart disease of native coronary artery without angina pectoris: Secondary | ICD-10-CM | POA: Diagnosis present

## 2016-11-11 DIAGNOSIS — Z8673 Personal history of transient ischemic attack (TIA), and cerebral infarction without residual deficits: Secondary | ICD-10-CM

## 2016-11-11 DIAGNOSIS — E785 Hyperlipidemia, unspecified: Secondary | ICD-10-CM | POA: Diagnosis present

## 2016-11-11 DIAGNOSIS — N939 Abnormal uterine and vaginal bleeding, unspecified: Secondary | ICD-10-CM

## 2016-11-11 DIAGNOSIS — J45909 Unspecified asthma, uncomplicated: Secondary | ICD-10-CM | POA: Diagnosis present

## 2016-11-11 DIAGNOSIS — M797 Fibromyalgia: Secondary | ICD-10-CM | POA: Diagnosis present

## 2016-11-11 DIAGNOSIS — R109 Unspecified abdominal pain: Secondary | ICD-10-CM

## 2016-11-11 DIAGNOSIS — I472 Ventricular tachycardia: Secondary | ICD-10-CM | POA: Diagnosis present

## 2016-11-11 DIAGNOSIS — F329 Major depressive disorder, single episode, unspecified: Secondary | ICD-10-CM | POA: Diagnosis present

## 2016-11-11 DIAGNOSIS — D62 Acute posthemorrhagic anemia: Secondary | ICD-10-CM | POA: Diagnosis present

## 2016-11-11 DIAGNOSIS — E1142 Type 2 diabetes mellitus with diabetic polyneuropathy: Secondary | ICD-10-CM | POA: Diagnosis present

## 2016-11-11 DIAGNOSIS — D259 Leiomyoma of uterus, unspecified: Principal | ICD-10-CM | POA: Diagnosis present

## 2016-11-11 DIAGNOSIS — Z6834 Body mass index (BMI) 34.0-34.9, adult: Secondary | ICD-10-CM

## 2016-11-11 DIAGNOSIS — F419 Anxiety disorder, unspecified: Secondary | ICD-10-CM | POA: Diagnosis present

## 2016-11-11 DIAGNOSIS — K3184 Gastroparesis: Secondary | ICD-10-CM | POA: Diagnosis present

## 2016-11-11 DIAGNOSIS — K920 Hematemesis: Secondary | ICD-10-CM | POA: Diagnosis present

## 2016-11-11 DIAGNOSIS — I252 Old myocardial infarction: Secondary | ICD-10-CM

## 2016-11-11 DIAGNOSIS — Z7901 Long term (current) use of anticoagulants: Secondary | ICD-10-CM

## 2016-11-11 DIAGNOSIS — B181 Chronic viral hepatitis B without delta-agent: Secondary | ICD-10-CM | POA: Diagnosis present

## 2016-11-11 DIAGNOSIS — E1143 Type 2 diabetes mellitus with diabetic autonomic (poly)neuropathy: Secondary | ICD-10-CM | POA: Diagnosis present

## 2016-11-11 DIAGNOSIS — D649 Anemia, unspecified: Secondary | ICD-10-CM

## 2016-11-11 DIAGNOSIS — Z79899 Other long term (current) drug therapy: Secondary | ICD-10-CM

## 2016-11-11 LAB — CBC WITH DIFFERENTIAL/PLATELET
HCT: 20 % — ABNORMAL LOW (ref 36.0–46.0)
Hemoglobin: 6.6 g/dL — CL (ref 12.0–15.0)
MCH: 22.6 pg — ABNORMAL LOW (ref 26.0–34.0)
MCHC: 33 g/dL (ref 30.0–36.0)
MCV: 68.5 fL — AB (ref 78.0–100.0)
PLATELETS: 179 10*3/uL (ref 150–400)
RBC: 2.92 MIL/uL — ABNORMAL LOW (ref 3.87–5.11)
RDW: 20.4 % — AB (ref 11.5–15.5)
WBC: 10.4 10*3/uL (ref 4.0–10.5)

## 2016-11-11 LAB — COMPREHENSIVE METABOLIC PANEL
ALK PHOS: 47 U/L (ref 38–126)
ALT: 6 U/L — AB (ref 14–54)
AST: 34 U/L (ref 15–41)
Albumin: 3.1 g/dL — ABNORMAL LOW (ref 3.5–5.0)
Anion gap: 11 (ref 5–15)
BUN: 10 mg/dL (ref 6–20)
CALCIUM: 8.9 mg/dL (ref 8.9–10.3)
CHLORIDE: 101 mmol/L (ref 101–111)
CO2: 22 mmol/L (ref 22–32)
CREATININE: 1.15 mg/dL — AB (ref 0.44–1.00)
GFR calc Af Amer: >60 mL/min (ref >60)
GFR, EST NON AFRICAN AMERICAN: 57 mL/min — AB (ref >60)
Glucose, Bld: 354 mg/dL — ABNORMAL HIGH (ref 65–99)
Potassium: 4.2 mmol/L (ref 3.5–5.1)
Sodium: 134 mmol/L — ABNORMAL LOW (ref 135–145)
Total Bilirubin: 1.3 mg/dL — ABNORMAL HIGH (ref 0.3–1.2)
Total Protein: 6.8 g/dL (ref 6.5–8.1)

## 2016-11-11 LAB — I-STAT BETA HCG BLOOD, ED (MC, WL, AP ONLY): I-stat hCG, quantitative: <5 m[IU]/mL (ref <5)

## 2016-11-11 LAB — PREPARE RBC (CROSSMATCH)

## 2016-11-11 LAB — I-STAT CG4 LACTIC ACID, ED: Lactic Acid, Venous: 2.09 mmol/L (ref 0.5–1.9)

## 2016-11-11 LAB — GLUCOSE, CAPILLARY: GLUCOSE-CAPILLARY: 236 mg/dL — AB (ref 65–99)

## 2016-11-11 MED ORDER — PROMETHAZINE HCL 25 MG/ML IJ SOLN
12.5000 mg | Freq: Four times a day (QID) | INTRAMUSCULAR | Status: DC | PRN
  Administered 2016-11-12 – 2016-11-17 (×10): 12.5 mg via INTRAVENOUS
  Filled 2016-11-11 (×12): qty 1

## 2016-11-11 MED ORDER — PROMETHAZINE HCL 25 MG/ML IJ SOLN
25.0000 mg | Freq: Once | INTRAMUSCULAR | Status: AC
  Administered 2016-11-11: 25 mg via INTRAVENOUS
  Filled 2016-11-11: qty 1

## 2016-11-11 MED ORDER — SODIUM CHLORIDE 0.9 % IV SOLN: 10.0000 mL/h | Freq: Once | INTRAVENOUS | Status: DC

## 2016-11-11 MED ORDER — FENTANYL CITRATE (PF) 100 MCG/2ML IJ SOLN
25.0000 ug | INTRAMUSCULAR | Status: AC | PRN
  Administered 2016-11-12: 25 ug via INTRAVENOUS
  Filled 2016-11-11: qty 2

## 2016-11-11 MED ORDER — INSULIN ASPART 100 UNIT/ML ~~LOC~~ SOLN
0.0000 [IU] | Freq: Every day | SUBCUTANEOUS | Status: DC
Start: 2016-11-11 — End: 2016-11-19
  Administered 2016-11-12: 2 [IU] via SUBCUTANEOUS
  Administered 2016-11-13 – 2016-11-17 (×4): 4 [IU] via SUBCUTANEOUS

## 2016-11-11 MED ORDER — SODIUM CHLORIDE 0.9 % IV SOLN
INTRAVENOUS | Status: DC
  Administered 2016-11-11: 23:00:00 via INTRAVENOUS

## 2016-11-11 MED ORDER — SODIUM CHLORIDE 0.9 % IV BOLUS (SEPSIS)
1000.0000 mL | Freq: Once | INTRAVENOUS | Status: AC
  Administered 2016-11-11: 1000 mL via INTRAVENOUS

## 2016-11-11 MED ORDER — FENTANYL CITRATE (PF) 100 MCG/2ML IJ SOLN
12.5000 ug | INTRAMUSCULAR | Status: DC | PRN
  Administered 2016-11-11: 12.5 ug via INTRAVENOUS
  Filled 2016-11-11: qty 2

## 2016-11-11 MED ORDER — INSULIN ASPART 100 UNIT/ML ~~LOC~~ SOLN
0.0000 [IU] | Freq: Three times a day (TID) | SUBCUTANEOUS | Status: DC
  Administered 2016-11-12: 5 [IU] via SUBCUTANEOUS
  Administered 2016-11-12: 2 [IU] via SUBCUTANEOUS
  Administered 2016-11-12: 3 [IU] via SUBCUTANEOUS
  Administered 2016-11-13 (×2): 11 [IU] via SUBCUTANEOUS
  Administered 2016-11-14: 8 [IU] via SUBCUTANEOUS
  Administered 2016-11-14 – 2016-11-15 (×3): 5 [IU] via SUBCUTANEOUS
  Administered 2016-11-15 (×2): 8 [IU] via SUBCUTANEOUS
  Administered 2016-11-16: 5 [IU] via SUBCUTANEOUS
  Administered 2016-11-16 (×2): 11 [IU] via SUBCUTANEOUS
  Administered 2016-11-17: 2 [IU] via SUBCUTANEOUS
  Administered 2016-11-17: 5 [IU] via SUBCUTANEOUS
  Administered 2016-11-18: 8 [IU] via SUBCUTANEOUS
  Administered 2016-11-18: 5 [IU] via SUBCUTANEOUS
  Administered 2016-11-18: 8 [IU] via SUBCUTANEOUS

## 2016-11-11 MED ORDER — ONDANSETRON HCL 4 MG/2ML IJ SOLN
4.0000 mg | Freq: Once | INTRAMUSCULAR | Status: AC
  Administered 2016-11-11: 4 mg via INTRAVENOUS
  Filled 2016-11-11: qty 2

## 2016-11-11 NOTE — ED Notes (Signed)
Pt advised Kathlee Nations, EMT that she is unable to provide urine sample at this time.  Janetta Hora, PA made aware.  No need for I/O cath at this time.

## 2016-11-11 NOTE — ED Triage Notes (Signed)
Pt here for abdominal pain with N/V onset yesterday. Pt actively vomiting brown colored emesis in triage about 1/2 emesis bag full.

## 2016-11-11 NOTE — H&P (Signed)
Date: 11/11/2016               Patient Name:  Shirley [name] MRN: [id number]  DOB: 04-Aug-1975 Age / Sex: 41 y.o., female   PCP: Clent Demark, PA-C         Medical Service: Internal Medicine Teaching Service         Attending Physician: Dr. Nat Christen, MD    First Contact: Dr. Trilby Drummer Pager: [phone]  Second Contact: Dr. Benjamine Mola Pager: [phone]       After Hours (After 5p/  First Contact Pager: [phone]  weekends / holidays): Second Contact Pager: [phone]   Chief Complaint: abdominal and vomiting  History of Present Illness:  41 y.o. female with PMHx significant for CAD s/p MI in 2008, IDA, HTN, Type 2 DM, gastroparesis, hx of CVA, fibromyalgia, right LE DVT, bilateral PE on Xarelto who presents to the ED with coffee ground emesis and vaginal bleeding. The patient states the vomiting started one day ago. She states that she has been vomiting every few minutes. She could not give an exact number of times she vomited. But states she was vomting all day yesterday, as well as all day today. She describes her vomit as brown/black in color.  Per ED nursing, the patient was actively vomiting brown colored emesis in triage. She states she took [place] for the nausea and vomiting and it helped somewhat. She is also experiencing right sided abdominal pain, which she describes as a squeezing and stabbing pain, like a "contraction." The pain is non-radiating. She has been unable to eat food. She has been able to drink small amounts of water. She states that she has been unable to keep some of her medications down. She denies blood in her stool.   The patient has also been experiencing vaginal bleeding for the past 2 weeks. She states her menstrual period began on 10/27/2016 and that her periods do not typically last two weeks long and are not this heavy. She states the vaginal bleeding is constant, and with clots. She denies vaginal pain.    The patient endorses chest pain and shortness of  breath, as well as right leg pain. She cannot describe the right leg pain, but says the entire leg is affected.  In the ED, she was tachycardic, afebrile, and very uncomfortable appearing. She was given IV fluids and pain medication.       Meds:  Current Meds  Medication Sig  . albuterol (PROVENTIL HFA;VENTOLIN HFA) 108 (90 Base) MCG/ACT inhaler Inhale 2 puffs into the lungs every 4 (four) hours as needed for wheezing or shortness of breath.  . clindamycin (CLINDAGEL) 1 % gel Apply topically 2 (two) times daily. (Patient taking differently: Apply 1 application topically 2 (two) times daily. )  . cloNIDine (CATAPRES) 0.1 MG tablet Take 0.1 mg by mouth daily as needed (SBP >180).  . hydrochlorothiazide (HYDRODIURIL) 25 MG tablet Take 1 tablet (25 mg total) by mouth daily. Take on tablet in the morning. (Patient taking differently: Take 25 mg by mouth daily. )  . insulin NPH-regular Human (NOVOLIN 70/30) (70-30) 100 UNIT/ML injection 25 units in the morning and 25 units in the evening. (Patient taking differently: Inject 25 Units into the skin 2 (two) times daily with a meal. )  . losartan (COZAAR) 50 MG tablet Take 1 tablet (50 mg total) by mouth daily.  Marland Kitchen lovastatin (MEVACOR) 20 MG tablet Take 1 tablet (20 mg total) by mouth at bedtime.  Marland Kitchen  metoCLOPramide (REGLAN) 10 MG tablet Take 1 tablet (10 mg total) by mouth 4 (four) times daily -  before meals and at bedtime.  . metoprolol tartrate (LOPRESSOR) 50 MG tablet Take 1 tablet (50 mg total) by mouth 2 (two) times daily.  Marland Kitchen omeprazole (PRILOSEC) 40 MG capsule Take 1 capsule (40 mg total) by mouth daily.  . promethazine (PHENERGAN) 25 MG tablet Take 1 tablet (25 mg total) by mouth every 8 (eight) hours as needed for nausea or vomiting.  . Rivaroxaban (XARELTO) 15 MG TABS tablet Take 15 mg by mouth See admin instructions. Filled 10/26/16: take 1 tablet (15 mg) by mouth twice daily with a meal for 21 days, then fill prescription for 20 mg tablets      Allergies: Allergies as of 11/11/2016 - Review Complete 11/11/2016  Allergen Reaction Noted  . Lisinopril Nausea Only and Swelling 03/17/2008  . Morphine and related Itching and Swelling 04/15/2013   Past Medical History:  Diagnosis Date  . Abscess of tunica vaginalis    10/09- Abundant S. aureus- sensitive to all abx  . Anxiety   . Blood dyscrasia   . CAD (coronary artery disease) 06/15/2006   s/p Subendocardial MI with PDA angioplasty(no stent) on 06/15/06 and relook  cath 06/19/06 showed patency of site. Cath 12/10- no restenosis or significant CAD progression  . CVA (cerebral vascular accident) ([place]) ~ 02/2014   denies residual on 04/22/2014  . CVA (cerebral vascular accident) [facility])    history of remote right cerebellar infarct noted on head CT at least since 10/2011  . Depression   . Diabetes mellitus type 2, uncontrolled, with complications ([place])   . Fibromyalgia   . Gastritis   . Gastroparesis    secondary to poorly controlled DM, last emptying study performed 01/2010  was normal but may be falsely positive as pt was on reglan  . GERD (gastroesophageal reflux disease)   . Hepatitis B, chronic (HCC)    Hep BeAb+,Hep B cAb+ & Hep BsAg+ (9/06)  . History of pyelonephritis    H/o GrpB Pyelonephritis (9/06) and UTI- 07/11- E.Coli, 12/10- GBS  . Hyperlipidemia   . Hypertension   . Iron deficiency anemia   . Irregular menses    Small ovarian follicles seen on KZ(9/93)  . MI (myocardial infarction) ([place]) 05/2006   PDA percutaneous transluminal coronary angioplasty  . Migraine    "weekly" (04/22/2014)  . N&V (nausea and vomiting)    Chronic. Unclear etiology with multiple admission and ED visits. CT abdomen with and without contrast (02/2011)  showed no acute process. Gastic Emptying scan (01/2010) was normal. Ultrasound of the abdomen was within normal limits. Hepatitis B viral load was undectable. HIV NR. EGD - gastritis, Hpylori + s/p Rx  . Obesity   . OSA (obstructive  sleep apnea)    "suppose to wear mask but I don't" (04/22/2014)  . Peripheral neuropathy   . Pneumonia    "this is probably the 2nd or 3rd time I've had pneumonia" (04/22/2014)  . Recurrent boils   . Seasonal asthma   . Substance abuse   . Thrombocytosis ([place])    Hem/Onc suggested 2/2 chronic hepatits and/or iron deficiency anemia    Family History:  Mother DM, CHF  Social History: Lives with mother and boyfriend. Denies tobacco and alcohol use. Most recent drug use last year: marijuana and cocaine. Denies IV drug use.  Review of Systems: A complete ROS was negative except as per HPI.   Physical Exam: Blood pressure Marland Kitchen)  164/112, pulse (!) 132, resp. rate 16, height 5\' 7"  (1.702 m), weight 207 lb (93.9 kg), last menstrual period 11/04/2016, SpO2 100 %. Physical Exam  Constitutional: She is oriented to person, place, and time. She appears well-developed and well-nourished. She appears distressed (The patient is rocking back and forth in pain. Cannot remain still.).  HENT:  Head: Normocephalic and atraumatic.  Eyes: EOM are normal.  Cardiovascular: Regular rhythm, S1 normal, S2 normal and normal heart sounds.  Tachycardia present.   Pulmonary/Chest: Effort normal and breath sounds normal.  Abdominal: Soft. Normal appearance. There is generalized tenderness. There is no rebound and no guarding.  Genitourinary: There is bleeding (Per nurses pelvic exam.) in the vagina.  Genitourinary Comments: Refused pelvic exam.  Musculoskeletal:       Right upper leg: Normal. She exhibits no swelling and no edema.       Right lower leg: Normal. She exhibits no tenderness, no swelling and no edema.  Neurological: She is alert and oriented to person, place, and time.  Skin: Skin is warm and dry.    Assessment & Plan by Problem:  Hematemesis  Patient presenting with abdominal pain, nausea, vomiting. Patient has been recently hospitalized for this issue.  This is most likely due to gastroparesis.  She was recently restarted on Xarelto after DVT and subsequent PE on  10/2016. Found to have a hemoglobin of 6.6. - Phenergan as needed - Zofran as needed - Resume Reglan 10mg  QID -IV fluids 125 cc/hr NS -Fentanyl 25 mcg q2 hours for severe pain -Consider GI consult in AM to determine source of bleeding  Symptomatic Anemia The patient is presenting with a 2 week duration of heavy vaginal bleeding. Most likely due to anticoagulation and history of uterine fibroid, which was diagnosed via [place] on 7/18. Hemoglobin decreased from 10.6 (7/16) -> 8.5 (7/18) -> 6.6 (7/21). Per ED nursing she was actively bleeding for the entire duration of ED stay. Tachycardic into the 140s in ED.  -Transfused 2 units of packed RBCs -CBC in AM, trend Hgb -Hold Xarelto    PE, DVT New right LE DVT diagnosed on 10/24/2016, Bilateral PE 10/30/2016. At that time was discharged on Xarelto 15 mg BID. Presented to the ED on 7/17--> CT angio chest done that showed no significant interval change in bilateral PE. Continues to be in sinus tachycardia despite 2 units of blood and IV fluids. Patient is also c/o of right LE pain and CP -Consider CT angio chest if tachycardia persists -Consider lower extremity doppler, patient may be a candidate for IVC filter  DM Most recent A1c 5.6 (08/2016) on Novolin 70/30 15 units BID at home. Glucose via CMET is 354 in ED.     -Currently NPO on Sliding scale with HS coverage.   CAD s/p MI HTN HLD Patient tachycardic into 140s and was also c/o chest pain. Due to cardiac history troponins were ordered, even though suspicion for acute MI is low. Magnesium was ordered due to concern for possible arrhythmia. -Continue to monitor with telemetry -Hold Metoprolol and Losartan and HCTZ   CODE:FULL  Dispo: Admit patient to Inpatient with expected length of stay greater than 2 midnights.  Signed: Melanee Spry, MD 11/11/2016, 7:43 PM  Pager: [phone]

## 2016-11-11 NOTE — ED Notes (Signed)
Bleeding noted to linens underneath patient.  Pt sat up on side of bed to change pad and mesh panties.  Pink tinged drainage noted on 2 pads when changed.  When pt stood up 1/2 dollar size bright red bleeding fell from vagina onto pad that was on floor underneath pt.

## 2016-11-11 NOTE — ED Provider Notes (Signed)
[facility] DEPT Provider Note   CSN: [id number] Arrival date & time: 11/11/16  1520     History   Chief Complaint Chief Complaint  Patient presents with  . Abdominal Pain  . Vomiting    HPI Shirley [name] is a 41 y.o. female who presents with recent bilateral PE on Xarelto, CAD, hx of CVA, Type 2 DM with gastroparesis, chronic abdominal pain, cyclical vomiting presents with abdominal pain and vomiting. She was recently admitted from 7/17-7/18. She states that she has been having right sided abdominal pain, N/V, heavy vaginal bleeding for one week. She has known fibroids but has been unable to make an appt with OBGYN. No fever, chest pain, SOB, diarrhea, bloody stools, syncope.   HPI  Past Medical History:  Diagnosis Date  . Abscess of tunica vaginalis    10/09- Abundant S. aureus- sensitive to all abx  . Anxiety   . Blood dyscrasia   . CAD (coronary artery disease) 06/15/2006   s/p Subendocardial MI with PDA angioplasty(no stent) on 06/15/06 and relook  cath 06/19/06 showed patency of site. Cath 12/10- no restenosis or significant CAD progression  . CVA (cerebral vascular accident) ([place]) ~ 02/2014   denies residual on 04/22/2014  . CVA (cerebral vascular accident) [facility])    history of remote right cerebellar infarct noted on head CT at least since 10/2011  . Depression   . Diabetes mellitus type 2, uncontrolled, with complications ([place])   . Fibromyalgia   . Gastritis   . Gastroparesis    secondary to poorly controlled DM, last emptying study performed 01/2010  was normal but may be falsely positive as pt was on reglan  . GERD (gastroesophageal reflux disease)   . Hepatitis B, chronic (HCC)    Hep BeAb+,Hep B cAb+ & Hep BsAg+ (9/06)  . History of pyelonephritis    H/o GrpB Pyelonephritis (9/06) and UTI- 07/11- E.Coli, 12/10- GBS  . Hyperlipidemia   . Hypertension   . Iron deficiency anemia   . Irregular menses    Small ovarian follicles seen on JI(9/67)  . MI  (myocardial infarction) ([place]) 05/2006   PDA percutaneous transluminal coronary angioplasty  . Migraine    "weekly" (04/22/2014)  . N&V (nausea and vomiting)    Chronic. Unclear etiology with multiple admission and ED visits. CT abdomen with and without contrast (02/2011)  showed no acute process. Gastic Emptying scan (01/2010) was normal. Ultrasound of the abdomen was within normal limits. Hepatitis B viral load was undectable. HIV NR. EGD - gastritis, Hpylori + s/p Rx  . Obesity   . OSA (obstructive sleep apnea)    "suppose to wear mask but I don't" (04/22/2014)  . Peripheral neuropathy   . Pneumonia    "this is probably the 2nd or 3rd time I've had pneumonia" (04/22/2014)  . Recurrent boils   . Seasonal asthma   . Substance abuse   . Thrombocytosis ([place])    Hem/Onc suggested 2/2 chronic hepatits and/or iron deficiency anemia    Patient Active Problem List   Diagnosis Date Noted  . Bilateral pulmonary embolism ([place]) 10/30/2016  . Acute DVT (deep venous thrombosis) ([place]) 10/30/2016  . Type 2 diabetes mellitus with hyperglycemia ([place]) 04/07/2016  . Diabetic gastroparesis ([place]) 04/06/2016  . Dehydration 03/27/2015  . Gastroparesis 11/11/2014  . Hematemesis 10/13/2014  . DKA (diabetic ketoacidoses) ([place]) 10/13/2014  . Tachycardia 07/21/2014  . Abdominal pain, chronic, left lower quadrant   . Diabetic gastroparesis associated with type 2 diabetes mellitus ([place]) 04/28/2014  .  History of Helicobacter pylori infection 04/22/2014  . Vaginal discharge 02/18/2014  . Atypical chest pain 07/22/2013  . Unspecified constipation 07/21/2013  . Tinea corporis 07/21/2013  . Intractable vomiting 04/29/2013  . Dysmenorrhea 04/22/2013  . UTI (urinary tract infection) 07/15/2012  . Headache(784.0) 02/08/2012  . Health care maintenance 01/22/2012  . Chronic hepatitis B ([place]) 03/07/2011  . History of leukocytosis 04/06/2010  . THROMBOCYTOSIS 04/06/2010  . Polysubstance abuse 02/23/2010  . Iron  deficiency anemia 11/22/2009  . PERIPHERAL NEUROPATHY 10/01/2009  . Hyperlipidemia 08/30/2009  . Diabetic polyneuropathy ([place]) 08/30/2009  . Hidradenitis (recurrent boils) 07/07/2008  . Depression 12/27/2007  . Abdominal pain, left lower quadrant 11/21/2007  . FIBROMYALGIA 10/30/2007  . BACK PAIN 04/01/2007  . OBSTRUCTIVE SLEEP APNEA 01/17/2007  . ANXIETY DEPRESSION 06/27/2006  . Chronic ischemic heart disease 06/15/2006  . OBESITY, MORBID 05/15/2006  . MIGRAINE HEADACHE 05/15/2006  . Asthma 05/15/2006  . Essential hypertension 01/16/2006  . IRREGULAR MENSTRUATION 01/16/2006  . PEDAL EDEMA 01/16/2006  . Poorly controlled type 2 diabetes mellitus with peripheral neuropathy ([place]) 01/16/1989    Past Surgical History:  Procedure Laterality Date  . CESAREAN SECTION  1997  . CORONARY ANGIOPLASTY WITH STENT PLACEMENT  2008   "2 stents"  . ESOPHAGOGASTRODUODENOSCOPY N/A 04/23/2014   Procedure: ESOPHAGOGASTRODUODENOSCOPY (EGD);  Surgeon: Winfield Cunas., MD;  Location: [facility] ENDOSCOPY;  Service: Endoscopy;  Laterality: N/A;    OB History    No data available       Home Medications    Prior to Admission medications   Medication Sig Start Date End Date Taking? Authorizing Provider  acetaminophen (TYLENOL) 500 MG tablet Take 500 mg by mouth every 6 (six) hours as needed for mild pain.     [provider]  albuterol (PROVENTIL HFA;VENTOLIN HFA) 108 (90 Base) MCG/ACT inhaler Inhale 2 puffs into the lungs every 4 (four) hours as needed for wheezing or shortness of breath. 09/27/16   Clent Demark, PA-C  aspirin EC 81 MG tablet Take 1 tablet (81 mg total) by mouth daily. 09/27/16   Clent Demark, PA-C  clindamycin (CLINDAGEL) 1 % gel Apply topically 2 (two) times daily. 09/27/16   Clent Demark, PA-C  ferrous sulfate 325 (65 FE) MG tablet Take 1 tablet (325 mg total) by mouth 2 (two) times daily with a meal. 09/05/16   Clent Demark, PA-C  glucose blood (FREESTYLE  TEST STRIPS) test strip Use as instructed 09/05/16   Clent Demark, PA-C  glucose monitoring kit (FREESTYLE) monitoring kit 1 each by Does not apply route 4 (four) times daily - after meals and at bedtime. 1 month Diabetic Testing Supplies for QAC-QHS accuchecks. 09/05/16   Clent Demark, PA-C  hydrochlorothiazide (HYDRODIURIL) 25 MG tablet Take 1 tablet (25 mg total) by mouth daily. Take on tablet in the morning. 09/27/16   Clent Demark, PA-C  insulin NPH-regular Human (NOVOLIN 70/30) (70-30) 100 UNIT/ML injection 25 units in the morning and 25 units in the evening. 09/27/16   Clent Demark, PA-C  INSULIN SYRINGE .5CC/28G (INS SYRINGE/NEEDLE .5CC/28G) 28G X 1/2" 0.5 ML MISC Please provide 1 month supply 09/27/16   Clent Demark, PA-C  Lancets (FREESTYLE) lancets Use as instructed 09/27/16   Clent Demark, PA-C  losartan (COZAAR) 50 MG tablet Take 1 tablet (50 mg total) by mouth daily. 09/27/16   Clent Demark, PA-C  lovastatin (MEVACOR) 20 MG tablet Take 1 tablet (20 mg total) by mouth at  bedtime. 09/27/16   Clent Demark, PA-C  metoCLOPramide (REGLAN) 10 MG tablet Take 1 tablet (10 mg total) by mouth 4 (four) times daily -  before meals and at bedtime. Patient not taking: Reported on 10/24/2016 09/27/16   Clent Demark, PA-C  metoprolol tartrate (LOPRESSOR) 50 MG tablet Take 1 tablet (50 mg total) by mouth 2 (two) times daily. 09/27/16   Clent Demark, PA-C  omeprazole (PRILOSEC) 40 MG capsule Take 1 capsule (40 mg total) by mouth daily. 09/27/16   Clent Demark, PA-C  ondansetron (ZOFRAN ODT) 8 MG disintegrating tablet Take 1 tablet (8 mg total) by mouth every 8 (eight) hours as needed for nausea or vomiting. 11/05/16   [name], MD  promethazine (PHENERGAN) 25 MG tablet Take 1 tablet (25 mg total) by mouth every 8 (eight) hours as needed for nausea or vomiting. 10/18/16   Lawyer, Harrell Gave, PA-C  Rivaroxaban (XARELTO) 15 MG TABS tablet Take 1 tablet (15 mg  total) by mouth 2 (two) times daily with a meal. Patient not taking: Reported on 11/05/2016 10/31/16 11/05/16  Neva Seat, MD  Rivaroxaban 15 & 20 MG TBPK Take as directed on package: Start with one 18m tablet by mouth twice a day with food. On Day 22, switch to one 231mtablet once a day with food. 10/24/16   MoShary DecampPA-C    Family History Family History  Problem Relation Age of Onset  . Diabetes Father     Social History Social History  Substance Use Topics  . Smoking status: Former Smoker    Types: Cigarettes    Quit date: 04/24/1996  . Smokeless tobacco: Never Used     Comment: quit smoking cigarettes age 89. Alcohol use No     Comment: 04/22/2014 "might have a few drinks a month"     Allergies   Lisinopril and Morphine and related   Review of Systems Review of Systems  Constitutional: Negative for chills and fever.  Respiratory: Negative for shortness of breath.   Cardiovascular: Negative for chest pain.  Gastrointestinal: Positive for abdominal pain, nausea and vomiting. Negative for blood in stool and diarrhea.  Genitourinary: Positive for menstrual problem and vaginal bleeding. Negative for dysuria.  Neurological: Negative for syncope and light-headedness.  All other systems reviewed and are negative.    Physical Exam Updated Vital Signs BP (!) 188/112   Pulse (!) 149   Resp (!) 25   Ht _0  (1.702 m)   Wt 93.9 kg (207 lb)   LMP 11/04/2016   SpO2 100%   BMI 32.42 kg/m   Physical Exam  Constitutional: She is oriented to person, place, and time. She appears well-developed and well-nourished. No distress.  NAD, falls asleep frequently during history  HENT:  Head: Normocephalic and atraumatic.  Eyes: Pupils are equal, round, and reactive to light. Conjunctivae are normal. Right eye exhibits no discharge. Left eye exhibits no discharge. No scleral icterus.  Neck: Normal range of motion.  Cardiovascular: Tachycardia present.  Exam reveals no  gallop and no friction rub.   No murmur heard. Pulmonary/Chest: Effort normal and breath sounds normal. No respiratory distress. She has no wheezes. She has no rales. She exhibits no tenderness.  Abdominal: Soft. Bowel sounds are normal. She exhibits no distension and no mass. There is tenderness (suprapubic tenderness). There is no rebound and no guarding. No hernia.  Genitourinary:  Genitourinary Comments: Pelvic: No inguinal lymphadenopathy or inguinal hernia noted. Normal external genitalia. No pain  with speculum insertion. Closed cervical os with blood oozing from cervix. Multiple large clots are on pad.  Chaperone present during exam.    Neurological: She is alert and oriented to person, place, and time.  Skin: Skin is warm and dry.  Psychiatric: She has a normal mood and affect. Her behavior is normal.  Nursing note and vitals reviewed.    ED Treatments / Results  Labs (all labs ordered are listed, but only abnormal results are displayed) Labs Reviewed  COMPREHENSIVE METABOLIC PANEL - Abnormal; Notable for the following:       Result Value   Sodium 134 (*)    Glucose, Bld 354 (*)    Creatinine, Ser 1.15 (*)    Albumin 3.1 (*)    ALT 6 (*)    Total Bilirubin 1.3 (*)    GFR calc non Af Amer 57 (*)    All other components within normal limits  CBC WITH DIFFERENTIAL/PLATELET - Abnormal; Notable for the following:    RBC 2.92 (*)    Hemoglobin 6.6 (*)    HCT 20.0 (*)    MCV 68.5 (*)    MCH 22.6 (*)    RDW 20.4 (*)    All other components within normal limits  GLUCOSE, CAPILLARY - Abnormal; Notable for the following:    Glucose-Capillary 236 (*)    All other components within normal limits  I-STAT CG4 LACTIC ACID, ED - Abnormal; Notable for the following:    Lactic Acid, Venous 2.09 (*)    All other components within normal limits  MRSA PCR SCREENING  URINALYSIS, ROUTINE W REFLEX MICROSCOPIC  BASIC METABOLIC PANEL  CBC  I-STAT BETA HCG BLOOD, ED ([facility], [facility], [facility] ONLY)  TYPE  AND SCREEN  PREPARE RBC (CROSSMATCH)    EKG  EKG Interpretation None       Radiology Dg Chest 2 View  Result Date: 11/11/2016 CLINICAL DATA:  Shortness of breath, tachycardia, vomiting. Bilateral pulmonary emboli. EXAM: CHEST  2 VIEW COMPARISON:  CT chest dated 11/07/2016 FINDINGS: Lungs are clear.  No pleural effusion or pneumothorax. The heart is normal in size. Mild degenerative changes of the visualized thoracolumbar spine. IMPRESSION: Normal chest radiographs. Electronically Signed   By: Julian Hy M.D.   On: 11/11/2016 15:51    Procedures Procedures (including critical care time)  CRITICAL CARE Performed by: Recardo Evangelist   Total critical care time: 35 minutes  Critical care time was exclusive of separately billable procedures and treating other patients.  Critical care was necessary to treat or prevent imminent or life-threatening deterioration.  Critical care was time spent personally by me on the following activities: development of treatment plan with patient and/or surrogate as well as nursing, discussions with consultants, evaluation of patient's response to treatment, examination of patient, obtaining history from patient or surrogate, ordering and performing treatments and interventions, ordering and review of laboratory studies, ordering and review of radiographic studies, pulse oximetry and re-evaluation of patient's condition.   Medications Ordered in ED Medications  promethazine (PHENERGAN) injection 12.5 mg (not administered)  0.9 %  sodium chloride infusion ( Intravenous New Bag/Given 11/11/16 2305)  fentaNYL (SUBLIMAZE) injection 25 mcg (not administered)  insulin aspart (novoLOG) injection 0-15 Units (not administered)  insulin aspart (novoLOG) injection 0-5 Units (not administered)  sodium chloride 0.9 % bolus 1,000 mL (0 mLs Intravenous Stopped 11/11/16 2106)  ondansetron (ZOFRAN) injection 4 mg (4 mg Intravenous Given 11/11/16 1801)    promethazine (PHENERGAN) injection 25 mg (25 mg Intravenous  Given 11/11/16 2039)     Initial Impression / Assessment and Plan / ED Course  I have reviewed the triage vital signs and the nursing notes.  Pertinent labs & imaging results that were available during my care of the patient were reviewed by me and considered in my medical decision making (see chart for details).  41 year old female presents with abdominal pain, N/V, vaginal bleeding. She is persistently tachycardic and hypertensive. Otherwise vitals are normal. Exam remarkable for moderate amount of vaginal bleeding. She has known fibroids which is likely the cause. CBC remarkable for hgb of 6.6 which has dropped from 8.5 three days ago. CMP remarkable for hyperglycemia of 354 and SCr of 1.15. Pt was typed and screened and 2 units of blood were ordered. Shared visit with Dr. Lacinda Axon. Will admit for further management. IM team consulted.  Final Clinical Impressions(s) / ED Diagnoses   Final diagnoses:  Symptomatic anemia  Vaginal bleeding  Abdominal pain, unspecified abdominal location  Intractable cyclical vomiting with nausea    New Prescriptions New Prescriptions   No medications on file     Iris Pert 11/11/16 2310    Nat Christen, MD 11/15/16 [phone]

## 2016-11-11 NOTE — ED Notes (Signed)
Lactic Acid results reported Dr.Cook. ED-Lab

## 2016-11-11 NOTE — ED Notes (Signed)
Attempted report 

## 2016-11-12 LAB — BASIC METABOLIC PANEL
ANION GAP: 6 (ref 5–15)
BUN: 8 mg/dL (ref 6–20)
CHLORIDE: 109 mmol/L (ref 101–111)
CO2: 24 mmol/L (ref 22–32)
Calcium: 8.4 mg/dL — ABNORMAL LOW (ref 8.9–10.3)
Creatinine, Ser: 0.92 mg/dL (ref 0.44–1.00)
GFR calc non Af Amer: >60 mL/min (ref >60)
Glucose, Bld: 226 mg/dL — ABNORMAL HIGH (ref 65–99)
POTASSIUM: 4 mmol/L (ref 3.5–5.1)
SODIUM: 139 mmol/L (ref 135–145)

## 2016-11-12 LAB — GLUCOSE, CAPILLARY
GLUCOSE-CAPILLARY: 139 mg/dL — AB (ref 65–99)
GLUCOSE-CAPILLARY: 178 mg/dL — AB (ref 65–99)
Glucose-Capillary: 220 mg/dL — ABNORMAL HIGH (ref 65–99)
Glucose-Capillary: 234 mg/dL — ABNORMAL HIGH (ref 65–99)

## 2016-11-12 LAB — CBC
HEMATOCRIT: 20 % — AB (ref 36.0–46.0)
HEMATOCRIT: 25.9 % — AB (ref 36.0–46.0)
HEMATOCRIT: 27.2 % — AB (ref 36.0–46.0)
HEMOGLOBIN: 8.9 g/dL — AB (ref 12.0–15.0)
Hemoglobin: 6.7 g/dL — CL (ref 12.0–15.0)
Hemoglobin: 9.2 g/dL — ABNORMAL LOW (ref 12.0–15.0)
MCH: 23.3 pg — ABNORMAL LOW (ref 26.0–34.0)
MCH: 25.4 pg — ABNORMAL LOW (ref 26.0–34.0)
MCH: 25.1 pg — AB (ref 26.0–34.0)
MCHC: 33.5 g/dL (ref 30.0–36.0)
MCHC: 34.4 g/dL (ref 30.0–36.0)
MCHC: 33.8 g/dL (ref 30.0–36.0)
MCV: 69.7 fL — AB (ref 78.0–100.0)
MCV: 73.8 fL — AB (ref 78.0–100.0)
MCV: 74.3 fL — AB (ref 78.0–100.0)
Platelets: 306 10*3/uL (ref 150–400)
Platelets: 309 10*3/uL (ref 150–400)
Platelets: 314 10*3/uL (ref 150–400)
RBC: 2.87 MIL/uL — AB (ref 3.87–5.11)
RBC: 3.51 MIL/uL — AB (ref 3.87–5.11)
RBC: 3.66 MIL/uL — ABNORMAL LOW (ref 3.87–5.11)
RDW: 20.2 % — ABNORMAL HIGH (ref 11.5–15.5)
RDW: 20.4 % — AB (ref 11.5–15.5)
RDW: 20.2 % — AB (ref 11.5–15.5)
WBC: 12.9 10*3/uL — AB (ref 4.0–10.5)
WBC: 11.9 10*3/uL — AB (ref 4.0–10.5)
WBC: 13 10*3/uL — AB (ref 4.0–10.5)

## 2016-11-12 LAB — TROPONIN I

## 2016-11-12 LAB — MAGNESIUM: Magnesium: 1.7 mg/dL (ref 1.7–2.4)

## 2016-11-12 LAB — PREPARE RBC (CROSSMATCH)

## 2016-11-12 LAB — MRSA PCR SCREENING: MRSA by PCR: NEGATIVE

## 2016-11-12 MED ORDER — MEGESTROL ACETATE 40 MG PO TABS
40.0000 mg | ORAL_TABLET | Freq: Two times a day (BID) | ORAL | Status: DC
  Administered 2016-11-12 – 2016-11-13 (×2): 40 mg via ORAL
  Filled 2016-11-12 (×3): qty 1

## 2016-11-12 MED ORDER — SODIUM CHLORIDE 0.9 % IV SOLN
Freq: Once | INTRAVENOUS | Status: AC
  Administered 2016-11-14: 15:00:00 via INTRAVENOUS

## 2016-11-12 MED ORDER — SODIUM CHLORIDE 0.9 % IV SOLN
INTRAVENOUS | Status: AC
  Administered 2016-11-12 – 2016-11-13 (×2): via INTRAVENOUS

## 2016-11-12 MED ORDER — SODIUM CHLORIDE 0.9% FLUSH
3.0000 mL | INTRAVENOUS | Status: DC | PRN
  Administered 2016-11-15: 10 mL via INTRAVENOUS
  Filled 2016-11-12: qty 3

## 2016-11-12 MED ORDER — SODIUM CHLORIDE 0.9% FLUSH
3.0000 mL | Freq: Two times a day (BID) | INTRAVENOUS | Status: DC
  Administered 2016-11-12 – 2016-11-14 (×4): 3 mL via INTRAVENOUS
  Administered 2016-11-14: 10 mL via INTRAVENOUS
  Administered 2016-11-15 – 2016-11-17 (×4): 3 mL via INTRAVENOUS

## 2016-11-12 MED ORDER — HYDROMORPHONE HCL 1 MG/ML IJ SOLN
1.0000 mg | INTRAMUSCULAR | Status: DC | PRN
  Administered 2016-11-12 – 2016-11-16 (×17): 1 mg via INTRAVENOUS
  Filled 2016-11-12 (×17): qty 1

## 2016-11-12 MED ORDER — FENTANYL CITRATE (PF) 100 MCG/2ML IJ SOLN
25.0000 ug | Freq: Once | INTRAMUSCULAR | Status: AC
  Administered 2016-11-12: 25 ug via INTRAVENOUS
  Filled 2016-11-12: qty 2

## 2016-11-12 NOTE — Progress Notes (Signed)
Paged by nurse due to episode of vaginal bleeding with clots, and patient c/o severe pain. Went to reassess the patient. She was in distress and appeared very uncomfortable. Saw large blood clot and blood saturated linens. After 1 unit of RBCs, hemoglobin increased from 6.6 -->6.7. Was getting second unit of RBCs.  The patient continues to describe her RLQ abdominal pain as cramping and contraction-like. Administered 25 mcg fentanyl. Plan to transfuse 1 more unit of RBCs.

## 2016-11-12 NOTE — Clinical Social Work Note (Signed)
CSW acknowledges consult for Social Work. However, there is no reason listed for consult. CSW marking as complete and signing off. Please reconsult if new Social Work needs arise and include reason for consult.  Oretha Ellis, [place], [place] Work  [phone]

## 2016-11-12 NOTE — Progress Notes (Addendum)
Patient demanding to eat and states she "hasn't eaten in 24 hours and I'm hungry".  Had requested 'something' for nausea and pain, and received Dilaudid for pain.  No vomiting / nausea since admission to unit.  MD notified of patient's wishes.

## 2016-11-12 NOTE — Progress Notes (Signed)
Patient up to commode with one assist to void.  Had large clot vaginally.  Blood infusion completed.  Appears very weak.  States having severe abdominal pain / cramping.  Discussed with MD.  Dilaudid ordered for pain

## 2016-11-12 NOTE — Progress Notes (Signed)
Patient ID: Shirley [name], female   DOB: December 31, 1975, 41 y.o.   MRN: [id number]   Subjective: Shirley [name] continues to complain of abdominal pain this morning. This is different than her chronic abdominal pain. She is feeling a cramping pain in her lower abdomen. She endorses continuing to have some symptoms of feeling lightheaded and dizzy this morning. She also continues to endorse pain in her right calf (location of prior DVT.) She continued to have heavy vaginal bleeding overnight and this morning.  Objective:  Vital signs in last 24 hours: Vitals:   11/12/16 0555 11/12/16 0613 11/12/16 0700 11/12/16 0830  BP: (!) 148/95 (!) 142/75 116/72   Pulse: (!) 115 96 95   Resp:  18 (!) 23   Temp: 97.9 F (36.6 C) 98.7 F (37.1 C) 99.8 F (37.7 C) 98.8 F (37.1 C)  TempSrc: Oral Oral Oral Oral  SpO2:  100% 100%   Weight:      Height:       Physical Exam  Constitutional: She is oriented to person, place, and time. She appears well-developed and well-nourished. She appears distressed.  HENT:  Head: Normocephalic and atraumatic.  Eyes: Conjunctivae and EOM are normal.  Cardiovascular: Regular rhythm and normal heart sounds.  Exam reveals no gallop and no friction rub.   No murmur heard. Pulmonary/Chest: Effort normal and breath sounds normal. No respiratory distress. She has no wheezes. She has no rales.  Abdominal: Soft. There is no tenderness.  Diffuse abdominal tenderness with tenderness greatest at RLQ  Musculoskeletal: She exhibits tenderness. She exhibits no edema or deformity.  Neurological: She is alert and oriented to person, place, and time.  Skin: Skin is warm and dry.   CBC Latest Ref Rng & Units 11/12/2016 11/12/2016 11/11/2016  WBC 4.0 - 10.5 K/uL 11.9(H) 12.9(H) 10.4  Hemoglobin 12.0 - 15.0 g/dL 8.9(L) 6.7(LL) 6.6(LL)  Hematocrit 36.0 - 46.0 % 25.9(L) 20.0(L) 20.0(L)  Platelets 150 - 400 K/uL 309 306 179   Assessment/Plan:  Symptomatic Anemia: In the setting of  hematemesis and 2 weeks of heavy vaginal bleeding, on Xarelto with uterine fibroid recently diagnosed via [place] on 7/18. Per ED nursing she was actively bleeding for the entire duration of ED stay. Tachycardia has improved to low 100s. - Hgb: 10.6 (7/16) -> 8.5 (7/18) -> 6.6 (7/21) - S/P 3 units PRBC overnight, Hgb improved to 8.9 - 171ml/hr Normal Saline Infusion - CBC q8h - Hold Xarelto  - Dilaudid 1g IV q4h PRN  PE, DVT: RLE DVT 10/24/2016, Bilateral PE 10/30/2016. On Xarelto 15 mg BID. Continues to experience RLE pain. - Xarelto held - Monitor for recurrent symptoms while off Xarelto for GI/GU Bleed  Hematemesis: In the setting of gatroparesis, chronic vomiting, and on Xarelto for DVT/PE. No hematemesis since admission. - Phenergan as needed - Zofran as needed - Resume Reglan 10mg  QID - IV fluids 125 cc/hr NS  DM: A1c 5.6 (08/2016); On Novolin 70/30 15u BID. Glucose 354 in ED. Currently NPO - Sliding scale with HS coverage.  CAD s/p MI: HTN: HLD: Patient c/o chest pain on admission. Troponin: neg x1. Run of V-TACH on telemetry in the setting of increased cardiac stress 2/2 blood loss and subsequent compensatory tachycardia. -Continue to monitor with telemetry -Hold Metoprolol and Losartan and HCTZ  F/E/N: 140ml/hr, NPO  CODE:FULL  Dispo: Anticipated discharge in approximately 2 - 4 day(s).   Neva Seat, DO IM PGY-1 Pager: [phone]

## 2016-11-13 ENCOUNTER — Inpatient Hospital Stay (HOSPITAL_COMMUNITY): Payer: Self-pay

## 2016-11-13 DIAGNOSIS — D62 Acute posthemorrhagic anemia: Secondary | ICD-10-CM

## 2016-11-13 DIAGNOSIS — N921 Excessive and frequent menstruation with irregular cycle: Secondary | ICD-10-CM

## 2016-11-13 DIAGNOSIS — N920 Excessive and frequent menstruation with regular cycle: Secondary | ICD-10-CM | POA: Diagnosis present

## 2016-11-13 DIAGNOSIS — Z86718 Personal history of other venous thrombosis and embolism: Secondary | ICD-10-CM

## 2016-11-13 LAB — CBC
HCT: 25.6 % — ABNORMAL LOW (ref 36.0–46.0)
HCT: 25.5 % — ABNORMAL LOW (ref 36.0–46.0)
Hemoglobin: 8.5 g/dL — ABNORMAL LOW (ref 12.0–15.0)
Hemoglobin: 8.7 g/dL — ABNORMAL LOW (ref 12.0–15.0)
MCH: 24.6 pg — ABNORMAL LOW (ref 26.0–34.0)
MCH: 25.3 pg — AB (ref 26.0–34.0)
MCHC: 33.2 g/dL (ref 30.0–36.0)
MCHC: 34.1 g/dL (ref 30.0–36.0)
MCV: 74 fL — ABNORMAL LOW (ref 78.0–100.0)
MCV: 74.1 fL — ABNORMAL LOW (ref 78.0–100.0)
PLATELETS: 312 10*3/uL (ref 150–400)
Platelets: 310 10*3/uL (ref 150–400)
RBC: 3.46 MIL/uL — ABNORMAL LOW (ref 3.87–5.11)
RBC: 3.44 MIL/uL — AB (ref 3.87–5.11)
RDW: 20.4 % — AB (ref 11.5–15.5)
RDW: 20.8 % — ABNORMAL HIGH (ref 11.5–15.5)
WBC: 12.3 10*3/uL — ABNORMAL HIGH (ref 4.0–10.5)
WBC: 14.4 10*3/uL — AB (ref 4.0–10.5)

## 2016-11-13 LAB — TYPE AND SCREEN
ABO/RH(D): A POS
ANTIBODY SCREEN: NEGATIVE
UNIT DIVISION: 0
Unit division: 0
Unit division: 0

## 2016-11-13 LAB — BASIC METABOLIC PANEL
Anion gap: 4 — ABNORMAL LOW (ref 5–15)
BUN: 6 mg/dL (ref 6–20)
CALCIUM: 8.1 mg/dL — AB (ref 8.9–10.3)
CO2: 24 mmol/L (ref 22–32)
CREATININE: 0.98 mg/dL (ref 0.44–1.00)
Chloride: 106 mmol/L (ref 101–111)
GFR calc non Af Amer: >60 mL/min (ref >60)
Glucose, Bld: 310 mg/dL — ABNORMAL HIGH (ref 65–99)
Potassium: 3.5 mmol/L (ref 3.5–5.1)
SODIUM: 134 mmol/L — AB (ref 135–145)

## 2016-11-13 LAB — GLUCOSE, CAPILLARY
GLUCOSE-CAPILLARY: 285 mg/dL — AB (ref 65–99)
GLUCOSE-CAPILLARY: 297 mg/dL — AB (ref 65–99)
GLUCOSE-CAPILLARY: 318 mg/dL — AB (ref 65–99)
Glucose-Capillary: 304 mg/dL — ABNORMAL HIGH (ref 65–99)
Glucose-Capillary: 327 mg/dL — ABNORMAL HIGH (ref 65–99)
Glucose-Capillary: 224 mg/dL — ABNORMAL HIGH (ref 65–99)

## 2016-11-13 LAB — BPAM RBC
Blood Product Expiration Date: 201807282359
Blood Product Expiration Date: 201807312359
Blood Product Expiration Date: 201807312359
ISSUE DATE / TIME: 201807220212
ISSUE DATE / TIME: 201807220547
ISSUE DATE / TIME: 201807212033
UNIT TYPE AND RH: 600
UNIT TYPE AND RH: 6200
UNIT TYPE AND RH: 6200

## 2016-11-13 MED ORDER — MEGESTROL ACETATE 40 MG PO TABS
80.0000 mg | ORAL_TABLET | Freq: Two times a day (BID) | ORAL | Status: DC
  Administered 2016-11-13 – 2016-11-18 (×10): 80 mg via ORAL
  Filled 2016-11-13 (×12): qty 2

## 2016-11-13 MED ORDER — SODIUM CHLORIDE 0.9 % IV SOLN
INTRAVENOUS | Status: AC
  Administered 2016-11-13 (×2): via INTRAVENOUS

## 2016-11-13 MED ORDER — INSULIN ASPART PROT & ASPART (70-30 MIX) 100 UNIT/ML ~~LOC~~ SUSP
10.0000 [IU] | Freq: Two times a day (BID) | SUBCUTANEOUS | Status: DC
  Administered 2016-11-13 – 2016-11-14 (×3): 10 [IU] via SUBCUTANEOUS
  Filled 2016-11-13: qty 10

## 2016-11-13 MED ORDER — MEGESTROL ACETATE 40 MG PO TABS
40.0000 mg | ORAL_TABLET | Freq: Once | ORAL | Status: AC
  Administered 2016-11-13: 40 mg via ORAL
  Filled 2016-11-13: qty 1

## 2016-11-13 NOTE — Progress Notes (Signed)
*  PRELIMINARY RESULTS* Vascular Ultrasound Bilateral lower extremity venous duplex has been completed.  Preliminary findings: Right lower extremity deep vein thrombosis appears unchanged from prior study.  Deep vein thrombosis extends from distal right common femoral vein, femoral vein, popliteal, posterior tibial and peroneal veins. Left lower extremity appears negative for thrombosis.  Preliminary results called to patients nurse, Jonni Sanger at 15:50.  Everrett Coombe 11/13/2016, 3:50 PM

## 2016-11-13 NOTE — Consult Note (Signed)
[facility] OB/GYN Attending Consult Note   Consult Date: 11/13/2016  Reason for Consult: Abnormal uterine bleeding (AUB) in the setting of anticoagulation and known uterine fibroids Referring Physician: Dr. Joni Reining   Assessment/Plan: For better control of her AUB, Megestrol dosage increased to 80 mg po bid.  Transfuse as necessary for medical indications.   No need for any urgent surgical intervention at this time. Will defer decisions about anticoagulation modalities to her primary team; but recommend consideration of  IVC filter only if bleeding is uncontrolled on Megestrol. Appointment scheduled for patient at [facility] on 11/20/2016 at 2:40 pm; will discuss long-term management of her AUB, fibroids, future fertility in the setting of her co-morbidities at that point. Appreciate care of [place] by her primary team Please call [phone] [facility] OB/GYN Attending on call) for any further gynecologic concerns at any time.  Thank you for involving [place] in the care of this patient.    Verita Schneiders, MD, FACOG Attending [place], [place] Phone: [phone]     History of Present Illness: Shirley [name] is an 41 y.o. 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  Patient's last menstrual period started on 10/27/2016 and she reported having very heavy bleeding, which was not characteristic of her usual periods.  She also had associated nausea, vomiting, lightheadedness and SOB. Upon evaluation in the ED, she was found to have a hemoglobin of  6.6.  She was admitted for further evaluation and management; patient  also needed glycemic control and management of her other medical conditions.   During our encounter today, patient reported being on Depo Provera for long term management of AUB, last dose was in May/June this year.  Does not have a GYN provider currently.  She does have a history of fibroids, a 6 cm intramural fibroid was seen on recent pelvic ultrasound. She wants to discuss management that could be fertility sparing; not interested in a hysterectomy at this point. Her bleeding is decreasing at this point, compared to admission.  Pertinent OB/GYN History: Patient's last menstrual period was 11/04/2016. History of fibroids Had one cesarean section.   Patient Active Problem List   Diagnosis Date Noted  . Acute blood loss anemia 11/13/2016  . Menorrhagia 11/13/2016  . Bilateral pulmonary embolism ([place]) 10/30/2016  . Acute DVT (deep venous thrombosis) ([place]) 10/30/2016  . Type 2 diabetes mellitus with hyperglycemia ([place]) 04/07/2016  . Diabetic gastroparesis ([place]) 04/06/2016  . Dehydration 03/27/2015  . Gastroparesis 11/11/2014  . Hematemesis 10/13/2014  . DKA (diabetic ketoacidoses) ([place]) 10/13/2014  . Tachycardia 07/21/2014  . Abdominal pain, chronic, left lower quadrant   . Diabetic gastroparesis associated with type 2 diabetes mellitus ([place]) 04/28/2014  . History of Helicobacter pylori infection 04/22/2014  . Vaginal discharge 02/18/2014  . Atypical chest pain 07/22/2013  . Unspecified constipation 07/21/2013  . Tinea corporis 07/21/2013  . Intractable vomiting 04/29/2013  . Dysmenorrhea 04/22/2013  . UTI (urinary tract infection) 07/15/2012  . Headache(784.0) 02/08/2012  . Health care maintenance 01/22/2012  . Chronic hepatitis B ([place]) 03/07/2011  . History of leukocytosis 04/06/2010  . THROMBOCYTOSIS 04/06/2010  . Polysubstance abuse 02/23/2010  .  Iron deficiency anemia 11/22/2009  . PERIPHERAL NEUROPATHY 10/01/2009  . Hyperlipidemia 08/30/2009  . Diabetic polyneuropathy  ([place]) 08/30/2009  . Hidradenitis (recurrent boils) 07/07/2008  . Depression 12/27/2007  . Abdominal pain, left lower quadrant 11/21/2007  . FIBROMYALGIA 10/30/2007  . BACK PAIN 04/01/2007  . OBSTRUCTIVE SLEEP APNEA 01/17/2007  . ANXIETY DEPRESSION 06/27/2006  . Chronic ischemic heart disease 06/15/2006  . OBESITY, MORBID 05/15/2006  . MIGRAINE HEADACHE 05/15/2006  . Asthma 05/15/2006  . Essential hypertension 01/16/2006  . IRREGULAR MENSTRUATION 01/16/2006  . PEDAL EDEMA 01/16/2006  . Poorly controlled type 2 diabetes mellitus with peripheral neuropathy ([place]) 01/16/1989    Past Medical History:  Diagnosis Date  . Abscess of tunica vaginalis    10/09- Abundant S. aureus- sensitive to all abx  . Anxiety   . Blood dyscrasia   . CAD (coronary artery disease) 06/15/2006   s/p Subendocardial MI with PDA angioplasty(no stent) on 06/15/06 and relook  cath 06/19/06 showed patency of site. Cath 12/10- no restenosis or significant CAD progression  . CVA (cerebral vascular accident) ([place]) ~ 02/2014   denies residual on 04/22/2014  . CVA (cerebral vascular accident) [facility])    history of remote right cerebellar infarct noted on head CT at least since 10/2011  . Depression   . Diabetes mellitus type 2, uncontrolled, with complications ([place])   . Fibromyalgia   . Gastritis   . Gastroparesis    secondary to poorly controlled DM, last emptying study performed 01/2010  was normal but may be falsely positive as pt was on reglan  . GERD (gastroesophageal reflux disease)   . Hepatitis B, chronic (HCC)    Hep BeAb+,Hep B cAb+ & Hep BsAg+ (9/06)  . History of pyelonephritis    H/o GrpB Pyelonephritis (9/06) and UTI- 07/11- E.Coli, 12/10- GBS  . Hyperlipidemia   . Hypertension   . Iron deficiency anemia   . Irregular menses    Small ovarian follicles seen on JO(8/78)  . MI (myocardial infarction) ([place]) 05/2006   PDA percutaneous transluminal coronary angioplasty  . Migraine    "weekly"  (04/22/2014)  . N&V (nausea and vomiting)    Chronic. Unclear etiology with multiple admission and ED visits. CT abdomen with and without contrast (02/2011)  showed no acute process. Gastic Emptying scan (01/2010) was normal. Ultrasound of the abdomen was within normal limits. Hepatitis B viral load was undectable. HIV NR. EGD - gastritis, Hpylori + s/p Rx  . Obesity   . OSA (obstructive sleep apnea)    "suppose to wear mask but I don't" (04/22/2014)  . Peripheral neuropathy   . Pneumonia    "this is probably the 2nd or 3rd time I've had pneumonia" (04/22/2014)  . Recurrent boils   . Seasonal asthma   . Substance abuse   . Thrombocytosis ([place])    Hem/Onc suggested 2/2 chronic hepatits and/or iron deficiency anemia    Past Surgical History:  Procedure Laterality Date  . CESAREAN SECTION  1997  . CORONARY ANGIOPLASTY WITH STENT PLACEMENT  2008   "2 stents"  . ESOPHAGOGASTRODUODENOSCOPY N/A 04/23/2014   Procedure: ESOPHAGOGASTRODUODENOSCOPY (EGD);  Surgeon: Winfield Cunas., MD;  Location: [facility] ENDOSCOPY;  Service: Endoscopy;  Laterality: N/A;    Family History  Problem Relation Age of Onset  . Diabetes Father     Social History:  reports that she quit smoking about 20 years ago. Her smoking use included Cigarettes. She has never used smokeless tobacco. She reports that she does not drink alcohol  or use drugs.  Allergies:  Allergies  Allergen Reactions  . Lisinopril Nausea Only and Swelling  . Morphine And Related Itching and Swelling    Medications:  I have reviewed the patient's current medications. Prior to Admission:  Prescriptions Prior to Admission  Medication Sig Dispense Refill Last Dose  . albuterol (PROVENTIL HFA;VENTOLIN HFA) 108 (90 Base) MCG/ACT inhaler Inhale 2 puffs into the lungs every 4 (four) hours as needed for wheezing or shortness of breath. 1 Inhaler 5 PRN  . clindamycin (CLINDAGEL) 1 % gel Apply topically 2 (two) times daily. (Patient taking  differently: Apply 1 application topically 2 (two) times daily. ) 30 g 0 Past Week at Unknown time  . hydrochlorothiazide (HYDRODIURIL) 25 MG tablet Take 1 tablet (25 mg total) by mouth daily. Take on tablet in the morning. (Patient taking differently: Take 25 mg by mouth daily. ) 90 tablet 1 couple days ago?  . insulin NPH-regular Human (NOVOLIN 70/30) (70-30) 100 UNIT/ML injection 25 units in the morning and 25 units in the evening. (Patient taking differently: Inject 25 Units into the skin 2 (two) times daily with a meal. ) 10 mL 11 couple days ago?  . losartan (COZAAR) 50 MG tablet Take 1 tablet (50 mg total) by mouth daily. 90 tablet 1 couple days ago?  . lovastatin (MEVACOR) 20 MG tablet Take 1 tablet (20 mg total) by mouth at bedtime. 90 tablet 1 couple days ago?  . metoCLOPramide (REGLAN) 10 MG tablet Take 1 tablet (10 mg total) by mouth 4 (four) times daily -  before meals and at bedtime. 120 tablet 1 couple days ago?  . metoprolol tartrate (LOPRESSOR) 50 MG tablet Take 1 tablet (50 mg total) by mouth 2 (two) times daily. 180 tablet 1 couple days ago?  . omeprazole (PRILOSEC) 40 MG capsule Take 1 capsule (40 mg total) by mouth daily. 90 capsule 1 couple days ago?  . promethazine (PHENERGAN) 25 MG tablet Take 1 tablet (25 mg total) by mouth every 8 (eight) hours as needed for nausea or vomiting. 15 tablet 0 PRN  . Rivaroxaban (XARELTO) 15 MG TABS tablet Take 15 mg by mouth See admin instructions. Filled 10/26/16: take 1 tablet (15 mg) by mouth twice daily with a meal for 21 days, then fill prescription for 20 mg tablets   11/11/2016 at sometime this morning  . aspirin EC 81 MG tablet Take 1 tablet (81 mg total) by mouth daily. (Patient not taking: Reported on 11/11/2016) 90 tablet 3 Not Taking at Unknown time  . ferrous sulfate 325 (65 FE) MG tablet Take 1 tablet (325 mg total) by mouth 2 (two) times daily with a meal. (Patient not taking: Reported on 11/11/2016) 60 tablet 0 Not Taking at Unknown time    . glucose blood (FREESTYLE TEST STRIPS) test strip Use as instructed (Patient not taking: Reported on 11/13/2016) 100 each 11 Not Taking at Unknown time  . glucose monitoring kit (FREESTYLE) monitoring kit 1 each by Does not apply route 4 (four) times daily - after meals and at bedtime. 1 month Diabetic Testing Supplies for QAC-QHS accuchecks. (Patient not taking: Reported on 11/13/2016) 1 each 1 Not Taking at Unknown time  . INSULIN SYRINGE .5CC/28G (INS SYRINGE/NEEDLE .5CC/28G) 28G X 1/2" 0.5 ML MISC Please provide 1 month supply (Patient not taking: Reported on 11/13/2016) 100 each 11 Not Taking at Unknown time  . Lancets (FREESTYLE) lancets Use as instructed (Patient not taking: Reported on 11/13/2016) 100 each 11 Not Taking at Unknown  time  . ondansetron (ZOFRAN ODT) 8 MG disintegrating tablet Take 1 tablet (8 mg total) by mouth every 8 (eight) hours as needed for nausea or vomiting. (Patient not taking: Reported on 11/11/2016) 10 tablet 0 Not Taking at Unknown time  . Rivaroxaban 15 & 20 MG TBPK Take as directed on package: Start with one 55m tablet by mouth twice a day with food. On Day 22, switch to one 258mtablet once a day with food. (Patient not taking: Reported on 11/11/2016) 51 each 0 Not Taking at Unknown time   Scheduled: . insulin aspart  0-15 Units Subcutaneous TID WC  . insulin aspart  0-5 Units Subcutaneous QHS  . insulin aspart protamine- aspart  10 Units Subcutaneous BID WC  . megestrol  80 mg Oral BID  . sodium chloride flush  3 mL Intravenous Q12H   Continuous: . sodium chloride    . sodium chloride 125 mL/hr at 11/13/16 1443   PRYYQ:MGNOIBBCWUGQBDILAUDID) injection, promethazine, sodium chloride flush Anti-infectives    None      Review of Systems: Pertinent items noted in HPI and remainder of comprehensive ROS otherwise negative.  Focused Physical Examination BP (!) 151/98 (BP Location: Right Arm)   Pulse 95   Temp 99.1 F (37.3 C) (Oral)   Resp 13   Ht 5' 7"  (1.702 m)   Wt 210 lb 5.1 oz (95.4 kg)   LMP 11/04/2016   SpO2 96%   BMI 32.94 kg/m  CONSTITUTIONAL: Well-developed, well-nourished female in no acute distress.  HENT:  Normocephalic, atraumatic, External right and left ear normal. Oropharynx is clear and moist EYES: Conjunctivae and EOM are normal. Pupils are equal, round, and reactive to light. No scleral icterus.  NECK: Normal range of motion, supple, no masses.  Normal thyroid.  SKIN: Skin is warm and dry. No rash noted. Not diaphoretic. No erythema. No pallor. NEProvoAlert and oriented to person, place, and time. Normal reflexes, muscle tone coordination. No cranial nerve deficit noted. PSYCHIATRIC: Normal mood and affect. Normal behavior. Normal judgment and thought content. CARDIOVASCULAR: Normal heart rate noted, regular rhythm RESPIRATORY: Effort and breath sounds normal, no problems with respiration noted. ABDOMEN: Soft, normal bowel sounds, no distention noted. Mild lower abdominal tenderness, no rebound or guarding.  PELVIC: Deferred MUSCULOSKELETAL: Normal range of motion. No tenderness.  No cyanosis, clubbing, or edema.  2+ distal pulses.  Results for orders placed or performed during the hospital encounter of 11/11/16 (from the past 72 hour(s))  Comprehensive metabolic panel     Status: Abnormal   Collection Time: 11/11/16  4:08 PM  Result Value Ref Range   Sodium 134 (L) 135 - 145 mmol/L   Potassium 4.2 3.5 - 5.1 mmol/L   Chloride 101 101 - 111 mmol/L   CO2 22 22 - 32 mmol/L   Glucose, Bld 354 (H) 65 - 99 mg/dL   BUN 10 6 - 20 mg/dL   Creatinine, Ser 1.15 (H) 0.44 - 1.00 mg/dL   Calcium 8.9 8.9 - 10.3 mg/dL   Total Protein 6.8 6.5 - 8.1 g/dL   Albumin 3.1 (L) 3.5 - 5.0 g/dL   AST 34 15 - 41 U/L   ALT 6 (L) 14 - 54 U/L   Alkaline Phosphatase 47 38 - 126 U/L   Total Bilirubin 1.3 (H) 0.3 - 1.2 mg/dL   GFR calc non Af Amer 57 (L) >60 mL/min   GFR calc Af Amer >60 >60 mL/min    Comment: (NOTE) The eGFR has  been  calculated using the CKD EPI equation. This calculation has not been validated in all clinical situations. eGFR's persistently <60 mL/min signify possible Chronic Kidney Disease.    Anion gap 11 5 - 15  CBC with Differential     Status: Abnormal   Collection Time: 11/11/16  4:08 PM  Result Value Ref Range   WBC 10.4 4.0 - 10.5 K/uL   RBC 2.92 (L) 3.87 - 5.11 MIL/uL   Hemoglobin 6.6 (LL) 12.0 - 15.0 g/dL    Comment: REPEATED TO VERIFY CRITICAL RESULT CALLED TO, READ BACK BY AND VERIFIED WITH: CARLA PRICE RN AT 1650 11/11/16 BY WOOLLENK    HCT 20.0 (L) 36.0 - 46.0 %   MCV 68.5 (L) 78.0 - 100.0 fL   MCH 22.6 (L) 26.0 - 34.0 pg   MCHC 33.0 30.0 - 36.0 g/dL   RDW 20.4 (H) 11.5 - 15.5 %   Platelets 179 150 - 400 K/uL  I-Stat beta hCG blood, ED     Status: None   Collection Time: 11/11/16  4:21 PM  Result Value Ref Range   I-stat hCG, quantitative <5.0 <5 mIU/mL   Comment 3            Comment:   GEST. AGE      CONC.  (mIU/mL)   <=1 WEEK        5 - 50     2 WEEKS       50 - 500     3 WEEKS       100 - 10,000     4 WEEKS     1,000 - 30,000        FEMALE AND NON-PREGNANT FEMALE:     LESS THAN 5 mIU/mL   I-Stat CG4 Lactic Acid, ED     Status: Abnormal   Collection Time: 11/11/16  4:23 PM  Result Value Ref Range   Lactic Acid, Venous 2.09 (HH) 0.5 - 1.9 mmol/L   Comment NOTIFIED PHYSICIAN   Type and screen [place]     Status: None   Collection Time: 11/11/16  5:55 PM  Result Value Ref Range   ABO/RH(D) A POS    Antibody Screen NEG    Sample Expiration 11/14/2016    Unit Number [id number]    Blood Component Type RED CELLS,LR    Unit division 00    Status of Unit ISSUED,FINAL    Transfusion Status OK TO TRANSFUSE    Crossmatch Result Compatible    Unit Number [id number]    Blood Component Type RED CELLS,LR    Unit division 00    Status of Unit ISSUED,FINAL    Transfusion Status OK TO TRANSFUSE    Crossmatch Result Compatible    Unit Number  [id number]    Blood Component Type RED CELLS,LR    Unit division 00    Status of Unit ISSUED,FINAL    Transfusion Status OK TO TRANSFUSE    Crossmatch Result Compatible   Prepare RBC     Status: None   Collection Time: 11/11/16  8:11 PM  Result Value Ref Range   Order Confirmation ORDER PROCESSED BY BLOOD BANK   MRSA PCR Screening     Status: None   Collection Time: 11/11/16 10:18 PM  Result Value Ref Range   MRSA by PCR NEGATIVE NEGATIVE    Comment:        The GeneXpert MRSA Assay (FDA approved for NASAL specimens only), is one component of a comprehensive  MRSA colonization surveillance program. It is not intended to diagnose MRSA infection nor to guide or monitor treatment for MRSA infections.   Glucose, capillary     Status: Abnormal   Collection Time: 11/11/16 10:56 PM  Result Value Ref Range   Glucose-Capillary 236 (H) 65 - 99 mg/dL  Basic metabolic panel     Status: Abnormal   Collection Time: 11/12/16  1:41 AM  Result Value Ref Range   Sodium 139 135 - 145 mmol/L   Potassium 4.0 3.5 - 5.1 mmol/L   Chloride 109 101 - 111 mmol/L   CO2 24 22 - 32 mmol/L   Glucose, Bld 226 (H) 65 - 99 mg/dL   BUN 8 6 - 20 mg/dL   Creatinine, Ser 0.92 0.44 - 1.00 mg/dL   Calcium 8.4 (L) 8.9 - 10.3 mg/dL   GFR calc non Af Amer >60 >60 mL/min   GFR calc Af Amer >60 >60 mL/min    Comment: (NOTE) The eGFR has been calculated using the CKD EPI equation. This calculation has not been validated in all clinical situations. eGFR's persistently <60 mL/min signify possible Chronic Kidney Disease.    Anion gap 6 5 - 15  CBC     Status: Abnormal   Collection Time: 11/12/16  1:41 AM  Result Value Ref Range   WBC 12.9 (H) 4.0 - 10.5 K/uL   RBC 2.87 (L) 3.87 - 5.11 MIL/uL   Hemoglobin 6.7 (LL) 12.0 - 15.0 g/dL    Comment: REPEATED TO VERIFY CRITICAL VALUE NOTED.  VALUE IS CONSISTENT WITH PREVIOUSLY REPORTED AND CALLED VALUE.    HCT 20.0 (L) 36.0 - 46.0 %   MCV 69.7 (L) 78.0 - 100.0 fL     MCH 23.3 (L) 26.0 - 34.0 pg   MCHC 33.5 30.0 - 36.0 g/dL   RDW 20.2 (H) 11.5 - 15.5 %   Platelets 306 150 - 400 K/uL  Magnesium     Status: None   Collection Time: 11/12/16  1:41 AM  Result Value Ref Range   Magnesium 1.7 1.7 - 2.4 mg/dL  Troponin I     Status: None   Collection Time: 11/12/16  1:41 AM  Result Value Ref Range   Troponin I <0.03 <0.03 ng/mL  Prepare RBC     Status: None   Collection Time: 11/12/16  4:51 AM  Result Value Ref Range   Order Confirmation ORDER PROCESSED BY BLOOD BANK   Glucose, capillary     Status: Abnormal   Collection Time: 11/12/16  8:03 AM  Result Value Ref Range   Glucose-Capillary 220 (H) 65 - 99 mg/dL   Comment 1 Notify RN    Comment 2 Document in Chart   CBC     Status: Abnormal   Collection Time: 11/12/16  9:56 AM  Result Value Ref Range   WBC 11.9 (H) 4.0 - 10.5 K/uL   RBC 3.51 (L) 3.87 - 5.11 MIL/uL   Hemoglobin 8.9 (L) 12.0 - 15.0 g/dL    Comment: POST TRANSFUSION SPECIMEN   HCT 25.9 (L) 36.0 - 46.0 %   MCV 73.8 (L) 78.0 - 100.0 fL   MCH 25.4 (L) 26.0 - 34.0 pg   MCHC 34.4 30.0 - 36.0 g/dL   RDW 20.4 (H) 11.5 - 15.5 %   Platelets 309 150 - 400 K/uL  Glucose, capillary     Status: Abnormal   Collection Time: 11/12/16 11:48 AM  Result Value Ref Range   Glucose-Capillary 139 (H) 65 - 99  mg/dL   Comment 1 Notify RN    Comment 2 Document in Chart   Glucose, capillary     Status: Abnormal   Collection Time: 11/12/16  4:27 PM  Result Value Ref Range   Glucose-Capillary 178 (H) 65 - 99 mg/dL   Comment 1 Notify RN    Comment 2 Document in Chart   CBC     Status: Abnormal   Collection Time: 11/12/16  6:02 PM  Result Value Ref Range   WBC 13.0 (H) 4.0 - 10.5 K/uL   RBC 3.66 (L) 3.87 - 5.11 MIL/uL   Hemoglobin 9.2 (L) 12.0 - 15.0 g/dL   HCT 27.2 (L) 36.0 - 46.0 %   MCV 74.3 (L) 78.0 - 100.0 fL   MCH 25.1 (L) 26.0 - 34.0 pg   MCHC 33.8 30.0 - 36.0 g/dL   RDW 20.2 (H) 11.5 - 15.5 %   Platelets 314 150 - 400 K/uL  Glucose,  capillary     Status: Abnormal   Collection Time: 11/12/16  9:11 PM  Result Value Ref Range   Glucose-Capillary 234 (H) 65 - 99 mg/dL   Comment 1 Notify RN   CBC     Status: Abnormal   Collection Time: 11/13/16  1:58 AM  Result Value Ref Range   WBC 12.3 (H) 4.0 - 10.5 K/uL   RBC 3.46 (L) 3.87 - 5.11 MIL/uL   Hemoglobin 8.5 (L) 12.0 - 15.0 g/dL   HCT 25.6 (L) 36.0 - 46.0 %   MCV 74.0 (L) 78.0 - 100.0 fL   MCH 24.6 (L) 26.0 - 34.0 pg   MCHC 33.2 30.0 - 36.0 g/dL   RDW 20.4 (H) 11.5 - 15.5 %   Platelets 310 150 - 400 K/uL  Basic metabolic panel     Status: Abnormal   Collection Time: 11/13/16  1:58 AM  Result Value Ref Range   Sodium 134 (L) 135 - 145 mmol/L   Potassium 3.5 3.5 - 5.1 mmol/L   Chloride 106 101 - 111 mmol/L   CO2 24 22 - 32 mmol/L   Glucose, Bld 310 (H) 65 - 99 mg/dL   BUN 6 6 - 20 mg/dL   Creatinine, Ser 0.98 0.44 - 1.00 mg/dL   Calcium 8.1 (L) 8.9 - 10.3 mg/dL   GFR calc non Af Amer >60 >60 mL/min   GFR calc Af Amer >60 >60 mL/min    Comment: (NOTE) The eGFR has been calculated using the CKD EPI equation. This calculation has not been validated in all clinical situations. eGFR's persistently <60 mL/min signify possible Chronic Kidney Disease.    Anion gap 4 (L) 5 - 15  Glucose, capillary     Status: Abnormal   Collection Time: 11/13/16  5:52 AM  Result Value Ref Range   Glucose-Capillary 285 (H) 65 - 99 mg/dL   Comment 1 Notify RN   Glucose, capillary     Status: Abnormal   Collection Time: 11/13/16  7:37 AM  Result Value Ref Range   Glucose-Capillary 304 (H) 65 - 99 mg/dL   Comment 1 Notify RN    Comment 2 Document in Chart   CBC     Status: Abnormal   Collection Time: 11/13/16  9:32 AM  Result Value Ref Range   WBC 14.4 (H) 4.0 - 10.5 K/uL   RBC 3.44 (L) 3.87 - 5.11 MIL/uL   Hemoglobin 8.7 (L) 12.0 - 15.0 g/dL   HCT 25.5 (L) 36.0 - 46.0 %   MCV  74.1 (L) 78.0 - 100.0 fL   MCH 25.3 (L) 26.0 - 34.0 pg   MCHC 34.1 30.0 - 36.0 g/dL   RDW 20.8 (H)  11.5 - 15.5 %   Platelets 312 150 - 400 K/uL  Glucose, capillary     Status: Abnormal   Collection Time: 11/13/16 11:52 AM  Result Value Ref Range   Glucose-Capillary 327 (H) 65 - 99 mg/dL   Comment 1 Notify RN    Comment 2 Document in Chart      Dg Chest 2 View  Result Date: 11/11/2016 CLINICAL DATA:  Shortness of breath, tachycardia, vomiting. Bilateral pulmonary emboli. EXAM: CHEST  2 VIEW COMPARISON:  CT chest dated 11/07/2016 FINDINGS: Lungs are clear.  No pleural effusion or pneumothorax. The heart is normal in size. Mild degenerative changes of the visualized thoracolumbar spine. IMPRESSION: Normal chest radiographs. Electronically Signed   By: Julian Hy M.D.   On: 11/11/2016 15:51   CLINICAL DATA:  41 year old female with pelvic pain.  EXAM: TRANSABDOMINAL ULTRASOUND OF PELVIS  COMPARISON:  Abdominal CT dated 11/05/2016  FINDINGS: Uterus  Measurements: The uterus is anteverted and measures 13.6 x 7.2 x 7.5 cm. There is a 6.5 x 5.7 x 6.4 cm posterior body ill-defined intramural fibroid.  Endometrium  Thickness: 3 mm. The endometrium is poorly visualized and not seen in its entirety. There is apparent anterior displacement of the endometrium by the fibroid.  Right ovary  Measurements: 2.5 x 1.3 x 2.3 cm. Normal appearance/no adnexal mass.  Left ovary  Measurements: 2.3 x 1.3 x 2.5 cm. Normal appearance/no adnexal mass.  Other findings:  No abnormal free fluid.  IMPRESSION: 1. Large posterior body intramural fibroid with mass effect and anterior displacement of the endometrium. 2. Poorly visualized endometrium with incomplete evaluation. 3. Unremarkable ovaries.   Electronically Signed   By: Anner Crete M.D.   On: 11/07/2016 05:26

## 2016-11-13 NOTE — Progress Notes (Signed)
Patient ID: Shirley [name], female   DOB: December 24, 1975, 41 y.o.   MRN: [id number]   Subjective: Shirley [name] continues to have cramping abdominal pain this morning. The pain is worsens and "feels like a contraction" when she is passing clots. The clots are "like slugs" in size and are passing a Shirley [name] less frequently today. Her vaginal bleeding is reduced today as well. She continues to endorse feeling of lightheaded and dizzy this morning, which worsens when she sits or stands. She endorses tenderness in her right calf (location of prior DVT) which is unchanged. The plan to have an OB/GYN speak to her about a potential hysterectomy and what alternatives there may be for treating her fibroid considering she does have some interest in having another child.  Objective:  Vital signs in last 24 hours: Vitals:   11/13/16 0030 11/13/16 0300 11/13/16 0640 11/13/16 0733  BP:  (!) 147/94  (!) 144/82  Pulse:  (!) 101  (!) 106  Resp: 20 18 18 20   Temp:  99.3 F (37.4 C)  98.8 F (37.1 C)  TempSrc:  Oral  Oral  SpO2: 100% 97% 100% 100%  Weight:      Height:       Physical Exam  Constitutional: She is oriented to person, place, and time. She appears well-developed and well-nourished.  Mild detress  HENT:  Head: Normocephalic and atraumatic.  Eyes: Conjunctivae and EOM are normal.  Cardiovascular: Regular rhythm and normal heart sounds.  Exam reveals no gallop and no friction rub.   No murmur heard. Pulmonary/Chest: Effort normal and breath sounds normal. No respiratory distress. She has no wheezes. She has no rales.  Abdominal: Soft. There is no tenderness.  Diffuse abdominal tenderness with tenderness greatest at RLQ  Musculoskeletal: She exhibits tenderness. She exhibits no edema or deformity.  RLE tenderness  Neurological: She is alert and oriented to person, place, and time.  Skin: Skin is warm and dry.   CBC Latest Ref Rng & Units 11/13/2016 11/12/2016 11/12/2016  WBC 4.0 - 10.5 K/uL 12.3(H)  13.0(H) 11.9(H)  Hemoglobin 12.0 - 15.0 g/dL 8.5(L) 9.2(L) 8.9(L)  Hematocrit 36.0 - 46.0 % 25.6(L) 27.2(L) 25.9(L)  Platelets 150 - 400 K/uL 310 314 309   Assessment/Plan:  Symptomatic Anemia: In the setting of hematemesis and 2 weeks of heavy vaginal bleeding, on Xarelto with uterine fibroid recently diagnosed via [place] on 7/18. Per ED nursing she was actively bleeding for the entire duration of ED stay. Tachycardia has improved to low 100s. Hgb: 10.6 (7/16) -> 8.5 (7/18) -> 6.6 (7/21). S/P 3 units PRBC overnight (7/21 - 7/22) subsequently Hgb improved to 8.9 - Hgb stable at 8.5 this morning - Transfer to telemetry unit - 169ml/hr Normal Saline Infusion - CBC q8h - Hold Xarelto - Dilaudid 1g IV q4h PRN - Megace 40mg  BID - OB/GYN Consult  PE, DVT: RLE DVT 10/24/2016, Bilateral PE 10/30/2016. On Xarelto 15 mg BID. Continues to experience RLE pain. - Xarelto held - Monitor for recurrent symptoms while off Xarelto for GI/GU Bleed - Bilat venous doppler  Hematemesis: In the setting of gatroparesis, chronic vomiting, and on Xarelto for DVT/PE. No hematemesis since admission. No hematemesis since admission. - Phenergan as needed - Zofran as needed - Resume Reglan 10mg  QID - IV fluids 125 cc/hr NS  DM: A1c 5.6 (08/2016); On Novolin 70/30 15u BID. Glucose 354 in ED. Currently NPO - 70/30 Insulin 10 U BID WC - SSI + qHs  CAD s/p MI: HTN: HLD:  Patient c/o chest pain on admission. Troponin: neg x1. Run of V-TACH on telemetry in the setting of increased cardiac stress 2/2 blood loss and subsequent compensatory tachycardia. -Continue to monitor with telemetry -Hold Metoprolol and Losartan and HCTZ  F/E/N: 118ml/hr, Soft  CODE:FULL  Dispo: Anticipated discharge in approximately 2 - 4 day(s).   Shirley Seat, DO IM PGY-1 Pager: [phone]

## 2016-11-13 NOTE — Progress Notes (Signed)
Inpatient Diabetes Program Recommendations  AACE/ADA: New Consensus Statement on Inpatient Glycemic Control (2015)  Target Ranges:  Prepandial:   less than 140 mg/dL      Peak postprandial:   less than 180 mg/dL (1-2 hours)      Critically ill patients:  140 - 180 mg/dL   Results for Shirley [name], Shirley [name] (MRN [id number]) as of 11/13/2016 09:20  Ref. Range 11/12/2016 08:03 11/12/2016 11:48 11/12/2016 16:27 11/12/2016 21:11 11/13/2016 05:52 11/13/2016 07:37  Glucose-Capillary Latest Ref Range: 65 - 99 mg/dL 220 (H) 139 (H) 178 (H) 234 (H) 285 (H) 304 (H)   Review of Glycemic Control  Diabetes history: DM2 Outpatient Diabetes medications: 70/30 25 units BID Current orders for Inpatient glycemic control: Novolog 0-15 units TID with meals, Novolog 0-5 units QHS  Inpatient Diabetes Program Recommendations: Insulin - Basal: Fasting glucose 304 mg/dl this morning. If patient is eating, please consider ordering 70/30 10 units BID (which will provide a total of 14 units for basal and 6 units for MC per day). If patinet is not eating well, may want to consider ordering Lantus 14 units Q24H (instead of ordering 70/30).  Thanks, Barnie Alderman, RN, MSN, CDE Diabetes Coordinator Inpatient Diabetes Program [phone] (Team Pager from 8am to 5pm)

## 2016-11-14 LAB — CBC
HEMATOCRIT: 24.4 % — AB (ref 36.0–46.0)
Hemoglobin: 8.2 g/dL — ABNORMAL LOW (ref 12.0–15.0)
MCH: 25.2 pg — ABNORMAL LOW (ref 26.0–34.0)
MCHC: 33.6 g/dL (ref 30.0–36.0)
MCV: 74.8 fL — AB (ref 78.0–100.0)
PLATELETS: 262 10*3/uL (ref 150–400)
RBC: 3.26 MIL/uL — ABNORMAL LOW (ref 3.87–5.11)
RDW: 21.1 % — AB (ref 11.5–15.5)
WBC: 11 10*3/uL — ABNORMAL HIGH (ref 4.0–10.5)

## 2016-11-14 LAB — GLUCOSE, CAPILLARY
Glucose-Capillary: 260 mg/dL — ABNORMAL HIGH (ref 65–99)
Glucose-Capillary: 237 mg/dL — ABNORMAL HIGH (ref 65–99)
Glucose-Capillary: 225 mg/dL — ABNORMAL HIGH (ref 65–99)
Glucose-Capillary: 348 mg/dL — ABNORMAL HIGH (ref 65–99)

## 2016-11-14 MED ORDER — SODIUM CHLORIDE 0.9 % IV SOLN
510.0000 mg | Freq: Once | INTRAVENOUS | Status: AC
  Administered 2016-11-14: 510 mg via INTRAVENOUS
  Filled 2016-11-14: qty 17

## 2016-11-14 MED ORDER — INSULIN ASPART PROT & ASPART (70-30 MIX) 100 UNIT/ML ~~LOC~~ SUSP
15.0000 [IU] | Freq: Two times a day (BID) | SUBCUTANEOUS | Status: DC
  Administered 2016-11-14 – 2016-11-15 (×2): 15 [IU] via SUBCUTANEOUS
  Filled 2016-11-14: qty 10

## 2016-11-14 NOTE — Progress Notes (Signed)
Patient ID: Shirley [name], female   DOB: 10/10/1975, 41 y.o.   MRN: [id number]   Subjective: Shirley [name] continues to have abdominal pain tender to palpation at the RLQ. The pain is worse, and "feels like a contraction," when she is passing clots. The clots remain "like slugs" in size and are passing less frequently today, most often when she goes to the bathroom. She continues to endorse feeling lightheaded and dizzy this morning, which worsens when she sits or stands. She endorses contnued tenderness in her right calf (location of prior DVT) which is unchanged. OB/GYN came and spoke with her and has arranged an outpatient appointment for 11/20/16 to discuss and work up her abnormal uterine bleeding, fibroid, and fertility.  Objective:  Vital signs in last 24 hours: Vitals:   11/13/16 1153 11/13/16 1948 11/14/16 0425 11/14/16 0900  BP: (!) 151/98 (!) 151/87 (!) 175/100 (!) 145/77  Pulse: 95 100 (!) 115 (!) 110  Resp: 13 14 17 18   Temp: 99.1 F (37.3 C) 99.3 F (37.4 C) 98.9 F (37.2 C) 98.8 F (37.1 C)  TempSrc: Oral Oral Oral Oral  SpO2: 96% 99% 99% 98%  Weight:  207 lb 7.3 oz (94.1 kg)    Height:       Physical Exam  Constitutional: She is oriented to person, place, and time. She appears well-developed and well-nourished.  HENT:  Head: Normocephalic and atraumatic.  Eyes: Conjunctivae and EOM are normal.  Cardiovascular: Regular rhythm and normal heart sounds.  Exam reveals no gallop and no friction rub.   No murmur heard. Pulmonary/Chest: Effort normal and breath sounds normal. No respiratory distress. She has no wheezes. She has no rales.  Abdominal: Soft. There is no tenderness.  RLQ tender to palpation  Musculoskeletal: She exhibits tenderness. She exhibits no edema or deformity.  RLE tenderness  Neurological: She is alert and oriented to person, place, and time.  Skin: Skin is warm and dry.   CBC Latest Ref Rng & Units 11/14/2016 11/13/2016 11/13/2016  WBC 4.0 - 10.5 K/uL  11.0(H) 14.4(H) 12.3(H)  Hemoglobin 12.0 - 15.0 g/dL 8.2(L) 8.7(L) 8.5(L)  Hematocrit 36.0 - 46.0 % 24.4(L) 25.5(L) 25.6(L)  Platelets 150 - 400 K/uL 262 312 310   Assessment/Plan:  Symptomatic Anemia: In the setting of hematemesis and 2 weeks of heavy vaginal bleeding, on Xarelto with history of uterine fibroid. Per ED nursing she was actively bleeding for the entire duration of ED stay. Tachycardia has improved to low 100s. Hgb: 10.6 (7/16) -> 8.5 (7/18) -> 6.6 (7/21). S/P 3 units PRBC overnight (7/21 - 7/22) subsequently Hgb improved to and stable in 8 - 9 range. - Hgb stable at 8.2 this morning - Appreciate OB/GYN recs - CBC Daily - Hold Xarelto. Consider restarting tomorrow - Dilaudid 1g IV q4h PRN - Megace increased to 80 mg BID - IV Iron  PE, DVT: RLE DVT 10/24/2016, Bilateral PE 10/30/2016. On Xarelto 15 mg BID. Continues to experience RLE pain. Bilat venous doppler showed no change in clot burden previous. Will consider IVC filter if unable to tolerate resumption of anticoagulation. - Xarelto held - Monitor for recurrent symptoms while off Xarelto for GI/GU Bleed - Consider restarting Xarelto tomorrow  Nausea Hematemesis: In the setting of gatroparesis, chronic vomiting, and on Xarelto for DVT/PE. No hematemesis since admission.  - Phenergan as needed - Zofran as needed - Resume Reglan 10mg  QID  DM: A1c 5.6 (08/2016); On Novolin 70/30 15u BID at home. Glucose 354 in ED. -  Increase 70/30 Insulin 15 U BID WC - SSI + qHs  CAD s/p MI: HTN: HLD: Patient c/o chest pain on admission. Troponin: neg x1. Run of V-TACH on telemetry in the setting of increased cardiac stress 2/2 blood loss and subsequent compensatory tachycardia. No significant telemetry events. - Discontinue cardiac monitoring - Hold Metoprolol and Losartan and HCTZ  F/E/N: Soft Diet  CODE:FULL  Dispo: Anticipated discharge in approximately 2 - 3 day(s).   Shirley Seat, DO IM PGY-1 Pager: [phone]

## 2016-11-15 LAB — CBC
HEMATOCRIT: 24.3 % — AB (ref 36.0–46.0)
Hemoglobin: 8.1 g/dL — ABNORMAL LOW (ref 12.0–15.0)
MCH: 25 pg — AB (ref 26.0–34.0)
MCHC: 33.3 g/dL (ref 30.0–36.0)
MCV: 75 fL — AB (ref 78.0–100.0)
PLATELETS: 223 10*3/uL (ref 150–400)
RBC: 3.24 MIL/uL — AB (ref 3.87–5.11)
RDW: 21.9 % — AB (ref 11.5–15.5)
WBC: 11.2 10*3/uL — ABNORMAL HIGH (ref 4.0–10.5)

## 2016-11-15 LAB — GLUCOSE, CAPILLARY
GLUCOSE-CAPILLARY: 263 mg/dL — AB (ref 65–99)
GLUCOSE-CAPILLARY: 335 mg/dL — AB (ref 65–99)
Glucose-Capillary: 246 mg/dL — ABNORMAL HIGH (ref 65–99)
Glucose-Capillary: 255 mg/dL — ABNORMAL HIGH (ref 65–99)

## 2016-11-15 MED ORDER — INSULIN ASPART PROT & ASPART (70-30 MIX) 100 UNIT/ML ~~LOC~~ SUSP
20.0000 [IU] | Freq: Two times a day (BID) | SUBCUTANEOUS | Status: DC
  Administered 2016-11-15 – 2016-11-16 (×2): 20 [IU] via SUBCUTANEOUS
  Filled 2016-11-15: qty 10

## 2016-11-15 MED ORDER — RIVAROXABAN 15 MG PO TABS
15.0000 mg | ORAL_TABLET | Freq: Two times a day (BID) | ORAL | Status: DC
  Administered 2016-11-15 – 2016-11-16 (×3): 15 mg via ORAL
  Filled 2016-11-15 (×3): qty 1

## 2016-11-15 MED ORDER — INSULIN ASPART PROT & ASPART (70-30 MIX) 100 UNIT/ML ~~LOC~~ SUSP
18.0000 [IU] | Freq: Two times a day (BID) | SUBCUTANEOUS | Status: DC
  Filled 2016-11-15: qty 10

## 2016-11-15 MED ORDER — RIVAROXABAN 15 MG PO TABS: 15.0000 mg | ORAL_TABLET | Freq: Two times a day (BID) | ORAL | Status: DC

## 2016-11-15 NOTE — Progress Notes (Signed)
Inpatient Diabetes Program Recommendations  AACE/ADA: New Consensus Statement on Inpatient Glycemic Control (2015)  Target Ranges:  Prepandial:   less than 140 mg/dL      Peak postprandial:   less than 180 mg/dL (1-2 hours)      Critically ill patients:  140 - 180 mg/dL   Lab Results  Component Value Date   GLUCAP 246 (H) 11/15/2016   HGBA1C 8.6 09/05/2016    Review of Glycemic Control  Diabetes history: DM2 Outpatient Diabetes medications: 70/30 25 units BID Current orders for Inpatient glycemic control: 70/30 15 units BID + Novolog 0-15 units TID with meals, Novolog 0-5 units QHS  Inpatient Diabetes Program Recommendations: Noted patient is eating >50% and elevated CBGs. Please consider: -Increase 70/30 to home dose 25 units bid  Thank you, Nani Gasser. Darryll Raju, RN, MSN, CDE  Diabetes Coordinator Inpatient Glycemic Control Team Team Pager [phone] (8am-5pm) 11/15/2016 11:12 AM

## 2016-11-15 NOTE — Progress Notes (Signed)
Patient ID: Shirley [name], female   DOB: 1975/08/04, 41 y.o.   MRN: [id number]   Subjective: Shirley [name] continues to have abdominal pain tender to palpation at the RLQ. The pain worsens with passing clots. She states she had a bigger clot last night about 3x bigger than previous. She continues to endorse feeling lightheaded and dizzy this morning with sitting or standing. Her right calf (location of prior DVT) remain tender, which is unchanged. She has an appointed with OB/GYN on 11/20/16, but stated today that she was considering a hysterectomy and asked how long it would take to have it done if she decided she wanted it. She also stated that, while she has interest in having more kids, she knows that she has medical problems and would likely have a complicated pregnancy. She also complained of R lower back pain and some concern for UTI that she was prescribed antibiotics for on 11/04/16, but never picked up. She denies dysuria or urine odor. She endorses polyuria.  Objective:  Vital signs in last 24 hours: Vitals:   11/14/16 0425 11/14/16 0900 11/14/16 1809 11/15/16 0515  BP: (!) 175/100 (!) 145/77 (!) 158/99 (!) 144/76  Pulse: (!) 115 (!) 110 (!) 101 (!) 101  Resp: 17 18 18 20   Temp: 98.9 F (37.2 C) 98.8 F (37.1 C) 97.9 F (36.6 C) 99.3 F (37.4 C)  TempSrc: Oral Oral Oral Oral  SpO2: 99% 98% 100% 99%  Weight:      Height:       Physical Exam  Constitutional: She is oriented to person, place, and time. She appears well-developed and well-nourished.  HENT:  Head: Normocephalic and atraumatic.  Eyes: Conjunctivae and EOM are normal.  Cardiovascular: Regular rhythm and normal heart sounds.  Exam reveals no gallop and no friction rub.   No murmur heard. Pulmonary/Chest: Effort normal and breath sounds normal. No respiratory distress. She has no wheezes. She has no rales.  Abdominal: Soft. There is no tenderness.  RLQ tender to palpation  Musculoskeletal: She exhibits tenderness. She  exhibits no edema or deformity.  RLE tenderness  Neurological: She is alert and oriented to person, place, and time.  Skin: Skin is warm and dry.   CBC Latest Ref Rng & Units 11/14/2016 11/13/2016 11/13/2016  WBC 4.0 - 10.5 K/uL 11.0(H) 14.4(H) 12.3(H)  Hemoglobin 12.0 - 15.0 g/dL 8.2(L) 8.7(L) 8.5(L)  Hematocrit 36.0 - 46.0 % 24.4(L) 25.5(L) 25.6(L)  Platelets 150 - 400 K/uL 262 312 310   Assessment/Plan:  Symptomatic Anemia: In the setting of hematemesis and 2 weeks of heavy vaginal bleeding, on Xarelto with history of uterine fibroid. Per ED nursing she was actively bleeding for the entire duration of ED stay. Tachycardia has improved to low 100s. Hgb: 10.6 (7/16) -> 8.5 (7/18) -> 6.6 (7/21). S/P 3 units PRBC overnight (7/21 - 7/22) subsequently Hgb improved to and stable in 8 - 9 range. Received IV Iron (7/24) - Hgb stable at 8.1 this morning - Appreciate OB/GYN recs - CBC Daily - Dilaudid 1g IV q4h PRN - Megace increased to 80 mg BID - Resume Xarelto  PE, DVT: RLE DVT 10/24/2016, Bilateral PE 10/30/2016. On Xarelto 15 mg BID. Continues to experience RLE pain. Bilat venous doppler showed no change in clot burden previous. Will consider IVC filter if unable to tolerate resumption of anticoagulation. - Monitor for recurrent symptoms while off Xarelto for GI/GU Bleed - Resume Xarelto  Nausea Hematemesis: In the setting of gatroparesis, chronic vomiting, and on Xarelto  for DVT/PE. No hematemesis since admission.  - Phenergan as needed - Zofran as needed - Resume Reglan 10mg  QID  DM: A1c 5.6 (08/2016); On Novolin 70/30 15u BID at home. - Increase 70/30 Insulin to 20U BID WC - SSI + qHs  CAD s/p MI: HTN: HLD: Patient c/o chest pain on admission. Troponin: neg x1. Run of V-TACH on telemetry in the setting of increased cardiac stress 2/2 blood loss and subsequent compensatory tachycardia. No significant telemetry events since. Telemetry discontinued. - Hold Metoprolol and Losartan and  HCTZ  Polyuria: Hyperglycemia vs Fibroid mass effect vs UTI. No other symptoms of UTI. - Blood Glucose control as above - Consider previously prescribed Keflex at discharge  F/E/N: Soft Diet  CODE:FULL  Dispo: Anticipated discharge in approximately 1 - 2 day(s).   Neva Seat, DO IM PGY-1 Pager: [phone]

## 2016-11-15 NOTE — Discharge Instructions (Addendum)

## 2016-11-15 NOTE — Progress Notes (Signed)
Agree with restarting Xarelto for full 21 day lead-in treatment course since patient never completed this before Xarelto was held for uterine bleeding.  Will discuss s/s of new clot with patient and also dosing regimen of Xarelto with her.  Xarelto 15mg  BIDwc until 8/16 when dose changes to Xarelto 20mg  qsupper    Ander Purpura D. Caprice Wasko, PharmD, BCPS Clinical Pharmacist Pager: [phone] Clinical Phone for 11/15/2016 until 3:30pm: [phone] If after 3:30pm, please call main pharmacy at [phone] 11/15/2016 3:03 PM

## 2016-11-16 DIAGNOSIS — D509 Iron deficiency anemia, unspecified: Secondary | ICD-10-CM

## 2016-11-16 DIAGNOSIS — R109 Unspecified abdominal pain: Secondary | ICD-10-CM

## 2016-11-16 LAB — URINALYSIS, ROUTINE W REFLEX MICROSCOPIC
BILIRUBIN URINE: NEGATIVE
Ketones, ur: NEGATIVE mg/dL
LEUKOCYTES UA: NEGATIVE
NITRITE: NEGATIVE
PROTEIN: NEGATIVE mg/dL
Specific Gravity, Urine: 1.018 (ref 1.005–1.030)
pH: 6 (ref 5.0–8.0)

## 2016-11-16 LAB — GLUCOSE, CAPILLARY
GLUCOSE-CAPILLARY: 322 mg/dL — AB (ref 65–99)
GLUCOSE-CAPILLARY: 320 mg/dL — AB (ref 65–99)
GLUCOSE-CAPILLARY: 116 mg/dL — AB (ref 65–99)
Glucose-Capillary: 235 mg/dL — ABNORMAL HIGH (ref 65–99)

## 2016-11-16 LAB — CBC
HEMATOCRIT: 23.6 % — AB (ref 36.0–46.0)
HEMOGLOBIN: 7.9 g/dL — AB (ref 12.0–15.0)
MCH: 25.2 pg — AB (ref 26.0–34.0)
MCHC: 33.5 g/dL (ref 30.0–36.0)
MCV: 75.4 fL — AB (ref 78.0–100.0)
Platelets: 227 10*3/uL (ref 150–400)
RBC: 3.13 MIL/uL — ABNORMAL LOW (ref 3.87–5.11)
RDW: 22.3 % — AB (ref 11.5–15.5)
WBC: 13.7 10*3/uL — ABNORMAL HIGH (ref 4.0–10.5)

## 2016-11-16 MED ORDER — APIXABAN 5 MG PO TABS
5.0000 mg | ORAL_TABLET | Freq: Two times a day (BID) | ORAL | Status: DC
  Administered 2016-11-17 – 2016-11-18 (×3): 5 mg via ORAL
  Filled 2016-11-16 (×4): qty 1

## 2016-11-16 MED ORDER — INSULIN ASPART PROT & ASPART (70-30 MIX) 100 UNIT/ML ~~LOC~~ SUSP
25.0000 [IU] | Freq: Two times a day (BID) | SUBCUTANEOUS | Status: DC
  Administered 2016-11-16 – 2016-11-18 (×3): 25 [IU] via SUBCUTANEOUS
  Filled 2016-11-16 (×2): qty 10

## 2016-11-16 MED ORDER — KETOROLAC TROMETHAMINE 30 MG/ML IJ SOLN
30.0000 mg | Freq: Once | INTRAMUSCULAR | Status: AC
Start: 2016-11-16 — End: 2016-11-16
  Administered 2016-11-16: 30 mg via INTRAVENOUS
  Filled 2016-11-16: qty 1

## 2016-11-16 MED ORDER — OXYCODONE-ACETAMINOPHEN 5-325 MG PO TABS
1.0000 | ORAL_TABLET | ORAL | Status: DC | PRN
  Administered 2016-11-16 – 2016-11-17 (×3): 1 via ORAL
  Filled 2016-11-16 (×4): qty 1

## 2016-11-16 MED ORDER — DIPHENHYDRAMINE HCL 25 MG PO CAPS
25.0000 mg | ORAL_CAPSULE | ORAL | Status: DC | PRN
  Administered 2016-11-16 – 2016-11-18 (×4): 25 mg via ORAL
  Filled 2016-11-16 (×5): qty 1

## 2016-11-16 NOTE — Progress Notes (Signed)
Inpatient Diabetes Program Recommendations  AACE/ADA: New Consensus Statement on Inpatient Glycemic Control (2015)  Target Ranges:  Prepandial:   less than 140 mg/dL      Peak postprandial:   less than 180 mg/dL (1-2 hours)      Critically ill patients:  140 - 180 mg/dL   Lab Results  Component Value Date   GLUCAP 322 (H) 11/16/2016   HGBA1C 8.6 09/05/2016    Review of Glycemic Control Results for Shirley [name], Shirley [name] (MRN [id number]) as of 11/16/2016 13:47  Ref. Range 11/15/2016 11:20 11/15/2016 16:35 11/15/2016 20:20 11/16/2016 07:17 11/16/2016 12:05  Glucose-Capillary Latest Ref Range: 65 - 99 mg/dL 255 (H) 263 (H) 335 (H) 235 (H) 322 (H)   Inpatient Diabetes Program Recommendations:  Noted elevated CBGs.  Please consider: -Increase 70/30 to home dose 25 units bid  Thank you, Nani Gasser. Jasn Xia, RN, MSN, CDE  Diabetes Coordinator Inpatient Glycemic Control Team Team Pager [phone] (8am-5pm) 11/16/2016 1:48 PM

## 2016-11-16 NOTE — Progress Notes (Signed)
Pt stated" I passed a huge blood clot Tuesday night and Wednesday night that caused stomach pain" PT flushed the blood clot down the toilet on Tuesday and Wednesday night the night RN and NT did not observe any blood clots from the patient.Explain to pt call nurse or nurse tech next time she passes a blood clot so we can observe will continue to monitor

## 2016-11-16 NOTE — Progress Notes (Signed)
Patient requesting pain meds for abdominal pain. MD made aware.

## 2016-11-16 NOTE — Progress Notes (Signed)
Patient ID: Shirley [name], female   DOB: 12/23/1975, 41 y.o.   MRN: [id number]   Subjective: Mrs. Pixler continues to have abdominal pain tender to palpation at the RLQ. The pain worsens with passing clots. She states she had a bigger clot last night about 3x bigger than previous. She continues to endorse feeling lightheaded and dizzy this morning with sitting or standing. Her right calf (location of prior DVT) remain tender, which is unchanged. She has an appointed with OB/GYN on 11/20/16, but stated today that she was considering a hysterectomy and asked how long it would take to have it done if she decided she wanted it. She also stated that, while she has interest in having more kids, she knows that she has medical problems and would likely have a complicated pregnancy. She also complained of R lower back pain and some concern for UTI that she was prescribed antibiotics for on 11/04/16, but never picked up. She denies dysuria or urine odor. She endorses polyuria.  Objective:  Vital signs in last 24 hours: Vitals:   11/15/16 2021 11/16/16 0618 11/16/16 1000 11/16/16 1701  BP: 128/62 139/80 140/78 130/69  Pulse: 98 (!) 108 (!) 103 (!) 108  Resp: 20 19 20 20   Temp: 98.5 F (36.9 C) 98.3 F (36.8 C) 98.2 F (36.8 C) 98 F (36.7 C)  TempSrc: Oral Oral Oral Oral  SpO2: 100% 97% 98% 98%  Weight: 207 lb 10.8 oz (94.2 kg)     Height:       Physical Exam  Constitutional: She is oriented to person, place, and time. She appears well-developed and well-nourished.  HENT:  Head: Normocephalic and atraumatic.  Eyes: Conjunctivae and EOM are normal.  Cardiovascular: Regular rhythm and normal heart sounds.  Exam reveals no gallop and no friction rub.   No murmur heard. Pulmonary/Chest: Effort normal and breath sounds normal. No respiratory distress. She has no wheezes. She has no rales.  Abdominal: Soft. There is no tenderness.  RLQ tender to palpation  Musculoskeletal: She exhibits tenderness. She  exhibits no edema or deformity.  RLE tenderness  Neurological: She is alert and oriented to person, place, and time.  Skin: Skin is warm and dry.   CBC Latest Ref Rng & Units 11/16/2016 11/15/2016 11/14/2016  WBC 4.0 - 10.5 K/uL 13.7(H) 11.2(H) 11.0(H)  Hemoglobin 12.0 - 15.0 g/dL 7.9(L) 8.1(L) 8.2(L)  Hematocrit 36.0 - 46.0 % 23.6(L) 24.3(L) 24.4(L)  Platelets 150 - 400 K/uL 227 223 262   Assessment/Plan:  Symptomatic Anemia: In the setting of hematemesis and 2 weeks of heavy vaginal bleeding, on Xarelto with history of uterine fibroid. Per ED nursing she was actively bleeding for the entire duration of ED stay. Tachycardia has improved to low 100s. Hgb: 10.6 (7/16) -> 8.5 (7/18) -> 6.6 (7/21). S/P 3 units PRBC overnight (7/21 - 7/22) subsequently Hgb improved to and stable in 8 - 9 range. Received IV Iron (7/24) - Hgb stable at 7.9 this morning - Appreciate OB/GYN recs - CBC Daily - Percocet 5-325 q4h PRN - Megace 80 mg BID - Initally Xarelto was resumed, but patient refused due to concerns about this medication, but was agreeable to alternative, so she was switched to apixaban 5mg  BID  PE, DVT: RLE DVT 10/24/2016, Bilateral PE 10/30/2016. On Xarelto 15 mg BID. Continues to experience RLE pain. Bilat venous doppler showed no change in clot burden previous. Will consider IVC filter if unable to tolerate resumption of anticoagulation. - Monitor for recurrent symptoms while  off Xarelto for GI/GU Bleed - Initally Xarelto was resumed, but patient refused due to concerns about this medication, but was agreeable to alternative, so she was switched to apixaban 5mg  BID  Nausea Hematemesis: In the setting of gatroparesis, chronic vomiting, and on Xarelto for DVT/PE. No hematemesis since admission.  - Phenergan as needed - Zofran as needed - Resume Reglan 10mg  QID  DM: A1c 5.6 (08/2016); On Novolin 70/30 25u BID at home. - Increase 70/30 Insulin to 25U BID WC - SSI + qHs  CAD s/p  MI: HTN: HLD: Patient c/o chest pain on admission. Troponin: neg x1. Run of V-TACH on telemetry in the setting of increased cardiac stress 2/2 blood loss and subsequent compensatory tachycardia. No significant telemetry events since. Telemetry discontinued. - Hold Metoprolol and Losartan and HCTZ  Polyuria: Hyperglycemia vs Fibroid mass effect vs UTI. No other symptoms of UTI. - Blood Glucose control as above - Consider previously prescribed Keflex at discharge  F/E/N: Soft Diet  CODE:FULL  Dispo: Anticipated discharge Tomorrow  Neva Seat, DO IM PGY-1 Pager: [phone]

## 2016-11-17 LAB — CBC
HEMATOCRIT: 25.1 % — AB (ref 36.0–46.0)
HEMOGLOBIN: 8.3 g/dL — AB (ref 12.0–15.0)
MCH: 25.2 pg — ABNORMAL LOW (ref 26.0–34.0)
MCHC: 33.1 g/dL (ref 30.0–36.0)
MCV: 76.1 fL — AB (ref 78.0–100.0)
Platelets: 247 10*3/uL (ref 150–400)
RBC: 3.3 MIL/uL — ABNORMAL LOW (ref 3.87–5.11)
RDW: 22.6 % — AB (ref 11.5–15.5)
WBC: 13.5 10*3/uL — AB (ref 4.0–10.5)

## 2016-11-17 LAB — GLUCOSE, CAPILLARY
GLUCOSE-CAPILLARY: 230 mg/dL — AB (ref 65–99)
GLUCOSE-CAPILLARY: 316 mg/dL — AB (ref 65–99)
Glucose-Capillary: 225 mg/dL — ABNORMAL HIGH (ref 65–99)
Glucose-Capillary: 129 mg/dL — ABNORMAL HIGH (ref 65–99)

## 2016-11-17 MED ORDER — APIXABAN 5 MG PO TABS: 5.0000 mg | ORAL_TABLET | Freq: Two times a day (BID) | ORAL | 0 refills | Status: DC

## 2016-11-17 MED ORDER — OXYCODONE HCL 5 MG PO TABS
5.0000 mg | ORAL_TABLET | ORAL | Status: AC
  Administered 2016-11-17: 5 mg via ORAL
  Filled 2016-11-17: qty 1

## 2016-11-17 MED ORDER — MEGESTROL ACETATE 40 MG PO TABS: 80.0000 mg | ORAL_TABLET | Freq: Two times a day (BID) | ORAL | 0 refills | Status: DC

## 2016-11-17 MED ORDER — OXYCODONE-ACETAMINOPHEN 5-325 MG PO TABS
2.0000 | ORAL_TABLET | ORAL | Status: DC | PRN
  Administered 2016-11-17 – 2016-11-18 (×6): 2 via ORAL
  Filled 2016-11-17 (×6): qty 2

## 2016-11-17 MED ORDER — OXYCODONE-ACETAMINOPHEN 5-325 MG PO TABS: 2.0000 | ORAL_TABLET | ORAL | 0 refills | Status: DC | PRN

## 2016-11-17 MED ORDER — ONDANSETRON HCL 4 MG PO TABS
4.0000 mg | ORAL_TABLET | Freq: Once | ORAL | Status: AC
  Administered 2016-11-17: 4 mg via ORAL
  Filled 2016-11-17: qty 1

## 2016-11-17 MED FILL — MEGESTROL 40 MG TABLET: 40 | 30 days supply | Qty: 120 | Fill #0

## 2016-11-17 NOTE — Progress Notes (Signed)
Paged the Internal medicine regarding Cardiac monitoring order to be initiated again but they said there is no need for it.

## 2016-11-17 NOTE — Progress Notes (Deleted)
Patient Discharge: Disposition: Patient discharged to home. Education: Reviewed medications, prescriptions, follow-up appointments and discharge instructions, understood and acknowledged. IV: Discontinue IV before discharge. Telemetry: Tele removed before discharge, CCMD notified. Transportation: patient escorted out of the unit in w/c till the ride. Belongings: Patient took all his belonging with her.

## 2016-11-17 NOTE — Progress Notes (Signed)
Paged the internal medicine regarding patient unable to obtain her medications since pharmacy is closed and will be all weekend. Per Internal Medicine pharmacy unable to refill meds here and recommended assistance from social worker, which is not available at this time, so they will be cancelling the discharge.  Patient notified and also oncoming nurse notified.

## 2016-11-17 NOTE — Progress Notes (Signed)
Case Management handed to me the Eliquis 30 day trial card, took it to the patient but patient wanted to be put it in the chart, placed in the chart and will make sure the oncoming nurse is aware about it.

## 2016-11-17 NOTE — Progress Notes (Signed)
Patient ID: Shirley [name], female   DOB: Jul 24, 1975, 41 y.o.   MRN: [id number]   Subjective: Shirley [name] continues to have chronic abdominal pain tender to palpation at the RLQ consistent with the location of her fibroid seen on imaging. The pain continues to worsen when passing clots, which continues to reduce in frequency. Her feeling of lightheaded and dizzyiness is improved this morning. Her right calf (location of prior DVT) remain tender, which is unchanged. She requested we adjust her pain regimen as the current one was not very effective. She was switched from Xarelto to now on Eliquis after she refused to keep taking Xarelto. She plans to follow up with OB/GYN on 11/20/16.  Objective:  Vital signs in last 24 hours: Vitals:   11/16/16 1701 11/16/16 2207 11/17/16 0649 11/17/16 0900  BP: 130/69 (!) 144/66 130/90 (!) 162/84  Pulse: (!) 108 (!) 101 100 100  Resp: 20 19 20 19   Temp: 98 F (36.7 C) 98.3 F (36.8 C) 98.3 F (36.8 C) 99.1 F (37.3 C)  TempSrc: Oral Oral Oral Oral  SpO2: 98% 99% 100% 100%  Weight:  207 lb 14.3 oz (94.3 kg)    Height:       Physical Exam  Constitutional: She is oriented to person, place, and time. She appears well-developed and well-nourished.  HENT:  Head: Normocephalic and atraumatic.  Eyes: Conjunctivae and EOM are normal.  Cardiovascular: Regular rhythm and normal heart sounds.  Exam reveals no gallop and no friction rub.   No murmur heard. Pulmonary/Chest: Effort normal and breath sounds normal. No respiratory distress. She has no wheezes. She has no rales.  Abdominal: Soft. There is no tenderness.  RLQ tender to palpation  Musculoskeletal: She exhibits tenderness. She exhibits no edema or deformity.  RLE tenderness  Neurological: She is alert and oriented to person, place, and time.  Skin: Skin is warm and dry.   CBC Latest Ref Rng & Units 11/17/2016 11/16/2016 11/15/2016  WBC 4.0 - 10.5 K/uL 13.5(H) 13.7(H) 11.2(H)  Hemoglobin 12.0 - 15.0  g/dL 8.3(L) 7.9(L) 8.1(L)  Hematocrit 36.0 - 46.0 % 25.1(L) 23.6(L) 24.3(L)  Platelets 150 - 400 K/uL 247 227 223   Assessment/Plan:  Symptomatic Anemia: Intial hematemesis resolved by time of admission. Her vaginal bleeding has improved. Hgb: 10.6 (7/16) -> 8.5 (7/18) -> 6.6 (7/21). S/P 3 units PRBC overnight (7/21 - 7/22) subsequently Hgb improved to and stable in 8 - 9 range. Received IV Iron (7/24) - Hgb stable at 8.3 this morning  - Appreciate OB/GYN recs - CBC Daily - Percocet 5-325 q4h PRN - Megace 80 mg BID  PE, DVT: RLE DVT 10/24/2016, Bilateral PE 10/30/2016. Previously on Xarelto 15 mg BID. Continues to experience RLE pain. Bilat venous doppler showed no change in clot burden previous. Initally Xarelto was resumed, but patient refused due to concerns about this medication, but was agreeable to alternative, so she was switched to apixaban 5mg  BID. - On apixaban  Nausea and Hematemesis: In the setting of gatroparesis, chronic vomiting, and on Xarelto for DVT/PE. No hematemesis since admission (resolved).  - Phenergan as needed - Zofran as needed - Reglan 10mg  QID  DM: A1c 5.6 (08/2016); On Novolin 70/30 25u BID at home. - Increase 70/30 Insulin to 25U BID WC - SSI + qHs  CAD s/p MI: HTN: HLD: Patient c/o chest pain on admission. Troponin: neg x1. Run of V-TACH on telemetry in the setting of increased cardiac stress 2/2 blood loss and subsequent compensatory tachycardia.  No significant telemetry events since. Telemetry discontinued. - Hold Metoprolol and Losartan and HCTZ  Polyuria: Hyperglycemia vs Fibroid mass effect vs UTI. No other symptoms of UTI. - Blood Glucose control as above - Consider previously prescribed Keflex at discharge  F/E/N: Soft Diet  CODE:FULL  Dispo: Anticipated discharge Today.  Neva Seat, DO IM PGY-1 Pager: [phone]

## 2016-11-17 NOTE — Progress Notes (Signed)
RN paged: Patient unable to obtain her medications since pharmacy is closed and will be all weekend. Pharmacy unable to refill meds here and recommended assistance from social worker, which is not available at this time. Patient will stay overnight.

## 2016-11-17 NOTE — Progress Notes (Signed)
Patient's BP 161/80 and was in pain 9/10,  Paged the internal medicine, ordered to give pain medicine and patient can discharge after that.  Administered pain med oxy.

## 2016-11-17 NOTE — Care Management Note (Signed)
Case Management Note  Patient Details  Name: Shirley [name] MRN: [id number] Date of Birth: [birth year]  Subjective/Objective:    CM following for progression and d/c planning.                Action/Plan: 11/17/2016 Pt will d/c on Eliquis, pt RN given card for 30 day supply free to give pt at time of d/c. This CM spoke with pt re followup care and access to ongoing medications. This pt states that her PCP is Dr Altamease Oiler at [facility] on [address]. Attempted to make a followup appointment however that office has closed at 12:30pm as this is a Friday. Pt instructed to make her own followup appointment.     Expected Discharge Date:      11/17/2016            Expected Discharge Plan:  Home/Self Care  In-House Referral:  NA  Discharge planning Services  CM Consult, Medication Assistance, [facility]  Post Acute Care Choice:  NA Choice offered to:  NA  DME Arranged:  N/A DME Agency:  NA  HH Arranged:  NA HH Agency:  NA  Status of Service:  Completed, signed off  If discussed at [place] of Stay Meetings, dates discussed:    Additional Comments:  Adron Bene, RN 11/17/2016, 1:28 PM

## 2016-11-18 LAB — GLUCOSE, CAPILLARY
GLUCOSE-CAPILLARY: 259 mg/dL — AB (ref 65–99)
GLUCOSE-CAPILLARY: 254 mg/dL — AB (ref 65–99)
Glucose-Capillary: 248 mg/dL — ABNORMAL HIGH (ref 65–99)

## 2016-11-18 MED ORDER — MEGESTROL ACETATE 40 MG PO TABS: 80.0000 mg | ORAL_TABLET | Freq: Two times a day (BID) | ORAL | 0 refills | Status: DC

## 2016-11-18 MED ORDER — PROMETHAZINE HCL 25 MG PO TABS: 25.0000 mg | ORAL_TABLET | Freq: Three times a day (TID) | ORAL | 0 refills | Status: DC | PRN

## 2016-11-18 MED ORDER — OXYCODONE-ACETAMINOPHEN 5-325 MG PO TABS: 2.0000 | ORAL_TABLET | Freq: Four times a day (QID) | ORAL | 0 refills | Status: AC | PRN

## 2016-11-18 MED ORDER — APIXABAN 5 MG PO TABS: 5.0000 mg | ORAL_TABLET | Freq: Two times a day (BID) | ORAL | 0 refills | Status: DC

## 2016-11-18 MED ORDER — PROMETHAZINE HCL 25 MG PO TABS: 25.0000 mg | ORAL_TABLET | Freq: Three times a day (TID) | ORAL | Status: DC | PRN

## 2016-11-18 NOTE — Progress Notes (Signed)
Pt refused discharge due to not having money to fill meds. Eloquis card given to pt by Case Manager yesterday. Pt states she uses [facility] and they are closed and has arrangement with them to get free meds. Pt is discharged and has paper RX's to take to another pharmacy with eloquis card. AC notifed. Gwyndolyn Saxon for house coverage notified.

## 2016-11-18 NOTE — Discharge Summary (Signed)
Name: Shirley [name] MRN: [id number] DOB: 03/12/1976 41 y.o. PCP: Shirley [name]  Date of Admission: 11/11/2016  3:36 PM Date of Discharge: 11/18/2016 Attending Physician: Shirley [name]  Discharge Diagnosis: Principal Problem:   Acute blood loss anemia Active Problems:   Iron deficiency anemia   Hematemesis   Type 2 diabetes mellitus with hyperglycemia ([place])   Menorrhagia   Discharge Medications: Allergies as of 11/18/2016      Reactions   Lisinopril Nausea Only, Swelling   Morphine And Related Itching, Swelling      Medication List    STOP taking these medications   Rivaroxaban 15 & 20 MG Tbpk   Rivaroxaban 15 MG Tabs tablet Commonly known as:  XARELTO     TAKE these medications   albuterol 108 (90 Base) MCG/ACT inhaler Commonly known as:  PROVENTIL HFA;VENTOLIN HFA Inhale 2 puffs into the lungs every 4 (four) hours as needed for wheezing or shortness of breath.   apixaban 5 MG Tabs tablet Commonly known as:  ELIQUIS Take 1 tablet (5 mg total) by mouth 2 (two) times daily.   aspirin EC 81 MG tablet Take 1 tablet (81 mg total) by mouth daily.   clindamycin 1 % gel Commonly known as:  CLINDAGEL Apply topically 2 (two) times daily. What changed:  how much to take   ferrous sulfate 325 (65 FE) MG tablet Take 1 tablet (325 mg total) by mouth 2 (two) times daily with a meal.   freestyle lancets Use as instructed   glucose blood test strip Commonly known as:  FREESTYLE TEST STRIPS Use as instructed   glucose monitoring kit monitoring kit 1 each by Does not apply route 4 (four) times daily - after meals and at bedtime. 1 month Diabetic Testing Supplies for QAC-QHS accuchecks.   hydrochlorothiazide 25 MG tablet Commonly known as:  HYDRODIURIL Take 1 tablet (25 mg total) by mouth daily. Take on tablet in the morning. What changed:  additional instructions   insulin NPH-regular Human (70-30) 100 UNIT/ML injection Commonly known as:  NOVOLIN  70/30 25 units in the morning and 25 units in the evening. What changed:  how much to take  how to take this  when to take this  additional instructions   INSULIN SYRINGE .5CC/28G 28G X 1/2" 0.5 ML Misc Commonly known as:  INS SYRINGE/NEEDLE .5CC/28G Please provide 1 month supply   losartan 50 MG tablet Commonly known as:  COZAAR Take 1 tablet (50 mg total) by mouth daily.   lovastatin 20 MG tablet Commonly known as:  MEVACOR Take 1 tablet (20 mg total) by mouth at bedtime.   megestrol 40 MG tablet Commonly known as:  MEGACE Take 2 tablets (80 mg total) by mouth 2 (two) times daily.   metoCLOPramide 10 MG tablet Commonly known as:  REGLAN Take 1 tablet (10 mg total) by mouth 4 (four) times daily -  before meals and at bedtime.   metoprolol tartrate 50 MG tablet Commonly known as:  LOPRESSOR Take 1 tablet (50 mg total) by mouth 2 (two) times daily.   omeprazole 40 MG capsule Commonly known as:  PRILOSEC Take 1 capsule (40 mg total) by mouth daily.   ondansetron 8 MG disintegrating tablet Commonly known as:  ZOFRAN ODT Take 1 tablet (8 mg total) by mouth every 8 (eight) hours as needed for nausea or vomiting.   oxyCODONE-acetaminophen 5-325 MG tablet Commonly known as:  PERCOCET/ROXICET Take 2 tablets by mouth every 6 (six) hours  as needed for moderate pain or severe pain.   promethazine 25 MG tablet Commonly known as:  PHENERGAN Take 1 tablet (25 mg total) by mouth every 8 (eight) hours as needed for nausea or vomiting.       Disposition and follow-up:   [name] was discharged from Shirley [name] in Shirley [name] condition.  At the hospital follow up visit please address:  1.  Menorrhagia on anticoagulation with acute blood loss anemia: Follow up repeat CBC if there is signifiacnt continued bleeding after resuming anticoagulation on eliquis. Follow up OBGYN plan for uterine fibroid management, discharged on megace 71m BID.  2.  RLE DVT and  bilateral PE: She was discharged to resume anticoagulation on eliqius 543mBID   Follow-up Appointments: Follow-up Information    [facility]   Why:  Please call for a followup appointment with PCP Shirley [name] Contact information: [phone]     GoClent DemarkPA-C. Call in 1 week(s).   Specialty:  Physician Assistant Why:  Please make an appointment in the next week. Contact information: [phone]           Hospital Course by problem list: Acute blood loss anemia Menorrhagia Admitted with a hemoglobin of 6.6 from a baseline of 10.5 with associated tachycardia and orthostatic dizziness. She was transfused 3 units of pRBCs with Shirley [name] response. Recent abdominal imaging showing increased size of right sided uterine fibroid was suspected to be related to her menorrhagia. OBGYN was consulted and megace started. Her anticoagulation was changed from xarelto to eliquis for possibly better tolerance with her uterine fibroid bleeding. Symptmos improved over several days and hemoglobin was stable passing a few clots by time of discharge.  Iron deficiency anemia She was transfused 51065mV feraheme for iron deficiency anemia on oral iron with acute additional loss due to bleeding.  Type 2 diabetes mellitus with hyperglycemia (HCC) Blood sugars were moderately elevated during the admission mostly in 200s with variable PO intake.  Nausea, vomiting, abdominal pain Her symptoms were managed with oxycodone, reglan, and zofran as a inpatient with quick resolution of vomiting but more persistent abdominal pain, which had been her main symptom in the past.  Discharge Vitals:   BP 137/75 (BP Location: Right Arm)   Pulse 93   Temp 99 F (37.2 C) (Oral)   Resp 18   Ht 5' 7"  (1.702 m)   Wt 218 lb 14.4 oz (99.3 kg)   LMP 11/04/2016   SpO2  97%   BMI 34.28 kg/m    Discharge Instructions: Discharge Instructions    Call MD for:  persistant dizziness or light-headedness    Complete by:  As directed    Discharge instructions    Complete by:  As directed    Please pick up your medications and take as directed.  You have been prescribed 5 days of as needed pain medication.  You have also been prescribed 30 days of megace for your uterine bleeding.  Your blood thinner has been switched from Xarelto to Eliquis - Please ask your pharmacist how to dispose of your xarelto - You have been provided with a card for a free 30 day supply of Eliquis  Please follow up with OB/GYN for your appointment on Monday 11/20/16  Please follow up with your primary care provider for further pain medication management and to  provide further prescriptions for Eliquis.   Increase activity slowly    Complete by:  As directed       Signed: Collier Salina, MD PGY-III Internal Medicine Resident Pager# [phone] 11/19/2016, 9:56 PM

## 2016-11-18 NOTE — Progress Notes (Signed)
   Subjective: She is feeling about the same this morning sigh some passed clots but no frank bleeding overnight. She appeared to be resting after eating a fair amount of breakfast this morning. Her main complaint remains crampy right sided abdominal pain that is partially relieved with oxycodone and lying on her left side. She is concerned about getting her medicines as she usually [facility] the [facility] for her medications.  Objective:  Vital signs in last 24 hours: Vitals:   11/17/16 1851 11/17/16 2112 11/18/16 0534 11/18/16 0938  BP: (!) 165/88 118/63 132/68 137/75  Pulse:  99 97 93  Resp:  18 16 18   Temp:  99 F (37.2 C) 99.2 F (37.3 C) 99 F (37.2 C)  TempSrc:  Oral Oral Oral  SpO2:  98% 99% 97%  Weight:  218 lb 14.4 oz (99.3 kg)    Height:       Physical Exam  Constitutional: She appears well-developed and well-nourished.  Cardiovascular: Normal rate and regular rhythm.   Pulmonary/Chest: Effort normal and breath sounds normal.  Abdominal: Soft. There is tenderness.  RLQ tender with light touch  Musculoskeletal: She exhibits tenderness. She exhibits no edema or deformity.  Faint right calf tenderness  Neurological: She is alert.  Skin: Skin is warm and dry.   CBC Latest Ref Rng & Units 11/17/2016 11/16/2016 11/15/2016  WBC 4.0 - 10.5 K/uL 13.5(H) 13.7(H) 11.2(H)  Hemoglobin 12.0 - 15.0 g/dL 8.3(L) 7.9(L) 8.1(L)  Hematocrit 36.0 - 46.0 % 25.1(L) 23.6(L) 24.3(L)  Platelets 150 - 400 K/uL 247 227 223   Assessment/Plan:  Symptomatic Anemia Abnormal uterine bleeding with fibroid She continues to report transient dizziness with sitting up and standing similar to the symptoms throughout her admission. Hgb was not repeated this morning due to anticipated overnight discharge but has been stable here for days with minimal reported clots o/n. Received transfusions x3 and  IV iron (Feraheme) x1 dose here. -Continue Megace 80mg  BID -Needs repeat CBC in outpt  F/U. -OBGYN F/U planned 7/30  PE, DVT Still in relatively high risk window considering repeated interruptions to therapy. No 10mg  loading dose given due to bleeding. 30 day card provided for patient. RLE DVT 10/24/2016, Bilateral PE 10/30/2016. - On apixaban 5mg  BID  Nausea and Hematemesis Probably in exacerbation due to abdominal pain and analgesics here. Will continue phenergan PRN here and x5 days at discharge while pain medication is prescribed.  Dispo: Anticipated discharge home today  Collier Salina, MD [facility] Internal Medicine Resident Pager# [phone] 11/18/2016, 11:26 AM

## 2016-11-19 NOTE — Progress Notes (Signed)
After discussion with patient, she had no money to obtain any of her prescriptions.  Per patient, she obtains medications at no cost at [facility] which does not open until Monday morning.  She does have a Eliquis Card to obtain a thirty day supply at any area pharmacy.  We were able to obtain enough Megace and Phenergan though our pharmacy.  We were able to print coupon on [employer] Web Site to obtain discount for 30 tablet Percocet.  Staff collected $16 for patient to afford the Percocet.  A cab voucher was obtained for trip to the pharmacy and then home.  Per patient, she and significant other had no other way home.  Discharge instructions were discussed and questions answered.  All personal belongings sent with patient.  AVS given.  Patient knows she needs to see GYN on Monday and make appt with [facility].  [employer] RN-BC, WTA.

## 2016-11-20 ENCOUNTER — Ambulatory Visit (INDEPENDENT_AMBULATORY_CARE_PROVIDER_SITE_OTHER): Payer: Self-pay | Admitting: Obstetrics & Gynecology

## 2016-11-20 ENCOUNTER — Encounter: Payer: Self-pay | Admitting: Obstetrics & Gynecology

## 2016-11-20 VITALS — BP 124/84 | HR 97 | Wt 230.0 lb

## 2016-11-20 DIAGNOSIS — D252 Subserosal leiomyoma of uterus: Secondary | ICD-10-CM

## 2016-11-20 DIAGNOSIS — D251 Intramural leiomyoma of uterus: Secondary | ICD-10-CM

## 2016-11-20 DIAGNOSIS — N939 Abnormal uterine and vaginal bleeding, unspecified: Secondary | ICD-10-CM

## 2016-11-20 MED ORDER — MEGESTROL ACETATE 40 MG PO TABS: 80.0000 mg | ORAL_TABLET | Freq: Two times a day (BID) | ORAL | 1 refills | Status: DC

## 2016-11-20 MED ORDER — MEGESTROL ACETATE 40 MG PO TABS: 80.0000 mg | ORAL_TABLET | Freq: Two times a day (BID) | ORAL | 1 refills | Status: AC

## 2016-11-20 MED FILL — PROMETHAZINE 25 MG TABLET: 25 | 5 days supply | Qty: 15 | Fill #0

## 2016-11-20 MED FILL — !ELIQUIS 5 MG TABLET: 5 | 30 days supply | Qty: 60 | Fill #0

## 2016-11-20 NOTE — Addendum Note (Signed)
Addended by: Christiana Pellant A on: 11/20/2016 04:27 PM   Modules accepted: Orders

## 2016-11-20 NOTE — Progress Notes (Signed)
History:  41 y.o. G2P2000 here today for AUB on anticoagulation. LMP 1 month prev. Pt was recently hopsitalized twice. She was initially admitted and dx'd with a DVT and subsequently she was dx'd with bilateral PE's. Upon starting anticoagulation pt was noted to start bleeding. She was started on Megace and ended up on 80mg  bid.   The bleeding is reportedly controlled at present and she reports only mild spotting x1 since discharge from the hosp 2 days prev.   Prior to hospitalization, pt was placed on Depo Provera to help with gastroparesis which was worse with menes. Pt denies bleeding issues prior to that injection. Pt did not begin to thave irreg menses until AFTER the Xeralto . Pt has not changed pad since discharge. Last Depo Provera inj 09/2016 (early).  2 days ago, pt reports switching from [place] to [facility] because her boyfriend found some info on the intternet and wanted her switched off Xeralto.  She denies other issues with Xeralto.         The following portions of the patient's history were reviewed and updated as appropriate: allergies, current medications, past family history, past medical history, past social history, past surgical history and problem list.  Review of Systems:  Pertinent items are noted in HPI.   Objective:  Physical Exam Blood pressure 124/84, pulse 97, weight 230 lb (104.3 kg), last menstrual period 11/04/2016.  CONSTITUTIONAL: Well-developed, well-nourished female in no acute distress.  HENT:  Normocephalic, atraumatic EYES: Conjunctivae and EOM are normal. No scleral icterus.  NECK: Normal range of motion SKIN: Skin is warm and dry. No rash noted. Not diaphoretic.No pallor. [place]: Alert and oriented to person, place, and time. Normal coordination.  GYN: deferred   Labs and Imaging Dg Chest 2 View  Result Date: 11/11/2016 CLINICAL DATA:  Shortness of breath, tachycardia, vomiting. Bilateral pulmonary emboli. EXAM: CHEST  2 VIEW COMPARISON:  CT  chest dated 11/07/2016 FINDINGS: Lungs are clear.  No pleural effusion or pneumothorax. The heart is normal in size. Mild degenerative changes of the visualized thoracolumbar spine. IMPRESSION: Normal chest radiographs. Electronically Signed   By: Julian Hy M.D.   On: 11/11/2016 15:51   Dg Chest 2 View  Result Date: 11/07/2016 CLINICAL DATA:  Right upper chest pain and dyspnea tonight. Nausea and vomiting. EXAM: CHEST  2 VIEW COMPARISON:  11/04/2016 CXR FINDINGS: The heart size and mediastinal contours are within normal limits. Both lungs are clear. The visualized skeletal structures are unremarkable. Braid artifacts project over both shoulders. IMPRESSION: No active cardiopulmonary disease. Electronically Signed   By: Ashley Royalty M.D.   On: 11/07/2016 00:39   Dg Chest 2 View  Result Date: 11/04/2016 CLINICAL DATA:  Chest pain with shortness of breath EXAM: CHEST  2 VIEW COMPARISON:  10/30/2016, 05/29/2016 FINDINGS: The heart size and mediastinal contours are within normal limits. Both lungs are clear. The visualized skeletal structures are unremarkable. IMPRESSION: No active cardiopulmonary disease. Electronically Signed   By: Donavan Foil M.D.   On: 11/04/2016 22:38   Dg Chest 2 View  Result Date: 10/30/2016 CLINICAL DATA:  Chest pain and shortness of breath EXAM: CHEST  2 VIEW COMPARISON:  09/09/2016 FINDINGS: The heart size and mediastinal contours are within normal limits. Both lungs are clear. The visualized skeletal structures are unremarkable. IMPRESSION: No active cardiopulmonary disease. Electronically Signed   By: Donavan Foil M.D.   On: 10/30/2016 01:11   Ct Angio Chest Pe W And/or Wo Contrast  Result Date: 11/07/2016 CLINICAL DATA:  41 year old female with shortness of breath. History of pulmonary emboli. EXAM: CT ANGIOGRAPHY CHEST WITH CONTRAST TECHNIQUE: Multidetector CT imaging of the chest was performed using the standard protocol during bolus administration of intravenous  contrast. Multiplanar CT image reconstructions and MIPs were obtained to evaluate the vascular anatomy. CONTRAST:  100 cc Isovue 370 COMPARISON:  Chest radiograph dated 11/07/2016 and chest CT dated 10/30/2016 FINDINGS: Cardiovascular: There is no cardiomegaly or pericardial effusion. The thoracic aorta is unremarkable. The origins of the great vessels of the aortic arch appear patent. Evaluation of the pulmonary artery is limited due to suboptimal opacification and timing of the contrast. Bilateral pulmonary emboli again noted which appears stable and similar to the prior CT. Mediastinum/Nodes: There is no hilar or mediastinal adenopathy. The esophagus and the thyroid gland are grossly unremarkable. Lungs/Pleura: Left upper lobe cylindrical bronchiectatic changes and scarring as seen on the prior CT. There is no focal consolidation, pleural effusion, or pneumothorax. The central airways are patent. Upper Abdomen: No acute abnormality. Musculoskeletal: No chest wall abnormality. No acute or significant osseous findings. Review of the MIP images confirms the above findings. IMPRESSION: Bilateral pulmonary artery emboli similar to prior CT and with no significant interval change. Evaluation of the pulmonary arteries is limited due to suboptimal opacification and timing of the contrast. No new intrathoracic pathology identified. Electronically Signed   By: Anner Crete M.D.   On: 11/07/2016 05:49   Ct Angio Chest Pe W And/or Wo Contrast  Result Date: 10/30/2016 CLINICAL DATA:  Sob, chest pain, cough, recent DVT in right leg on 10/24/16. 100 ml of isovue 370 given EXAM: CT ANGIOGRAPHY CHEST WITH CONTRAST TECHNIQUE: Multidetector CT imaging of the chest was performed using the standard protocol during bolus administration of intravenous contrast. Multiplanar CT image reconstructions and MIPs were obtained to evaluate the vascular anatomy. CONTRAST:  100 cc Isovue 370 COMPARISON:  Chest x-ray 10/30/2016 FINDINGS:  Cardiovascular: There is significant clot within the pulmonary arteries bilaterally. Emboli are identified with right upper lobe, right middle lobe, and right lower lobe branches of the pulmonary artery. Clot is identified within the left upper lobe and left lower lobe branches of the pulmonary artery. Normal RV: LV ratio. Heart is normal in appearance. No pericardial effusion. No significant atherosclerosis of the thoracic aorta. No aneurysm. Mediastinum/Nodes: The visualized portion of the thyroid gland has a normal appearance. No mediastinal, hilar, or axillary adenopathy. Esophagus is normal in appearance. Lungs/Pleura: Minimal atelectasis versus infarction in the lingula. No pleural effusion. There is cylindrical bronchiectasis associated scarring in the left upper lobe. No suspicious pulmonary nodules. Upper Abdomen: Gallbladder is present. Upper abdomen is unremarkable. Musculoskeletal: No chest wall abnormality. No acute or significant osseous findings. Review of the MIP images confirms the above findings. IMPRESSION: 1. Study is positive for bilateral pulmonary emboli. No evidence for right heart strain. 2. Atelectasis versus infarct the lingula. 3. Bronchiectasis and scarring in the left upper lobe. Critical Value/emergent results were called by telephone at the time of interpretation on 10/30/2016 at 9:55 am to [facility], who verbally acknowledged these results. Electronically Signed   By: Nolon Nations M.D.   On: 10/30/2016 09:55   US Pelvis Complete  Result Date: 11/07/2016 CLINICAL DATA:  41 year old female with pelvic pain. EXAM: TRANSABDOMINAL ULTRASOUND OF PELVIS TECHNIQUE: Transabdominal ultrasound examination of the pelvis was performed including evaluation of the uterus, ovaries, adnexal regions, and pelvic cul-de-sac. COMPARISON:  Abdominal CT dated 11/05/2016 FINDINGS: Uterus Measurements: The uterus is anteverted and measures  13.6 x 7.2 x 7.5 cm. There is a 6.5 x 5.7 x 6.4 cm  posterior body ill-defined intramural fibroid. Endometrium Thickness: 3 mm. The endometrium is poorly visualized and not seen in its entirety. There is apparent anterior displacement of the endometrium by the fibroid. Right ovary Measurements: 2.5 x 1.3 x 2.3 cm. Normal appearance/no adnexal mass. Left ovary Measurements: 2.3 x 1.3 x 2.5 cm. Normal appearance/no adnexal mass. Other findings:  No abnormal free fluid. IMPRESSION: 1. Large posterior body intramural fibroid with mass effect and anterior displacement of the endometrium. 2. Poorly visualized endometrium with incomplete evaluation. 3. Unremarkable ovaries. Electronically Signed   By: Anner Crete M.D.   On: 11/07/2016 05:26   Ct Renal Stone Study  Result Date: 11/05/2016 CLINICAL DATA:  Recurrent shortness of breath and chest pain, right-sided flank and abdominal pain EXAM: CT ABDOMEN AND PELVIS WITHOUT CONTRAST TECHNIQUE: Multidetector CT imaging of the abdomen and pelvis was performed following the standard protocol without IV contrast. COMPARISON:  10/24/2016 FINDINGS: Lower chest: Lung bases demonstrate no acute consolidation or pleural effusion. Borderline heart size. Hepatobiliary: No focal liver abnormality is seen. No gallstones, gallbladder wall thickening, or biliary dilatation. Pancreas: Unremarkable. No pancreatic ductal dilatation or surrounding inflammatory changes. Spleen: Normal in size without focal abnormality. Adrenals/Urinary Tract: Adrenal glands are unremarkable. Kidneys are normal, without renal calculi, focal lesion, or hydronephrosis. Bladder is unremarkable. Stomach/Bowel: Stomach is within normal limits. Appendix appears normal. No evidence of bowel wall thickening, distention, or inflammatory changes. Vascular/Lymphatic: No significant vascular findings are present. No enlarged abdominal or pelvic lymph nodes. Reproductive: Enlarged globular appearing uterus similar compared to prior. No adnexal masses. Other: No free  air or free fluid. Musculoskeletal: Degenerative changes of the spine. No acute or suspicious bone lesion. IMPRESSION: 1. Negative for nephrolithiasis or hydronephrosis. Negative for ureteral stone. 2. Negative appendix 3. Prominent globular appearing uterus as before. Electronically Signed   By: Donavan Foil M.D.   On: 11/05/2016 02:51   Ct Renal Stone Study  Result Date: 10/24/2016 CLINICAL DATA:  41 year old female with right flank and back pain since yesterday. EXAM: CT ABDOMEN AND PELVIS WITHOUT CONTRAST TECHNIQUE: Multidetector CT imaging of the abdomen and pelvis was performed following the standard protocol without IV contrast. COMPARISON:  CT Abdomen and Pelvis 09/23/2016 and earlier. FINDINGS: Lower chest: Negative lung bases. No pleural effusion. Borderline to mild cardiomegaly. No pericardial effusion, although there might be a small right posterior cardiophrenic angle cyst (such as small pericardial cyst) which has not significantly changed since 2011. Hepatobiliary: Negative noncontrast liver and gallbladder. Pancreas: Stable fatty atrophy of the pancreatic head and uncinate. Spleen: Negative. Adrenals/Urinary Tract: Normal adrenal glands. Negative noncontrast left kidney and left ureter. Unremarkable urinary bladder. Negative noncontrast right kidney and right ureter. Small pelvic phleboliths are stable. No urologic calculus identified. Stomach/Bowel: Negative sigmoid colon and rectum except for some retained stool. Similar retained stool in the left colon. Negative transverse colon. Negative right colon and appendix. Negative terminal ileum. No dilated small bowel. Negative stomach and duodenum. No abdominal free fluid. Vascular/Lymphatic: Vascular patency is not evaluated in the absence of IV contrast. No lymphadenopathy. Reproductive: Chronic rounded enlargement of the uterine fundus suggesting a right eccentric fundal fibroid, stable. Adnexa appears stable and within normal limits. Other: No  pelvic free fluid. Musculoskeletal: Chronic lumbosacral junction disc and endplate degeneration. No acute osseous abnormality identified. IMPRESSION: 1. No urologic calculus or obstructive uropathy. 2. No acute or inflammatory process identified.  Normal appendix. 3. Fibroid  uterus. 4. Advanced chronic L5-S1 disc degeneration. Electronically Signed   By: Genevie Ann M.D.   On: 10/24/2016 15:48    Assessment & Plan:  AUB  Megace 80 mg po bid  F/u in 4 weeks or sooner prn  Pelvic exam with PAP at that time    Total face-to-face time with patient was 20 min.  Greater than 50% was spent in counseling and coordination of care with the patient.   Emerly Prak L. Harraway-Smith, M.D., [place]lynn June

## 2016-11-20 NOTE — Progress Notes (Signed)
Pt is on depo injections.

## 2016-11-24 IMAGING — DX DG CHEST 2V
2 series · 2 of 2 positions shown · non-contrast
Comparison: 04/03/2014

CLINICAL DATA: Chest pain for 3 days

EXAM:
CHEST  2 VIEW

[chest pa]
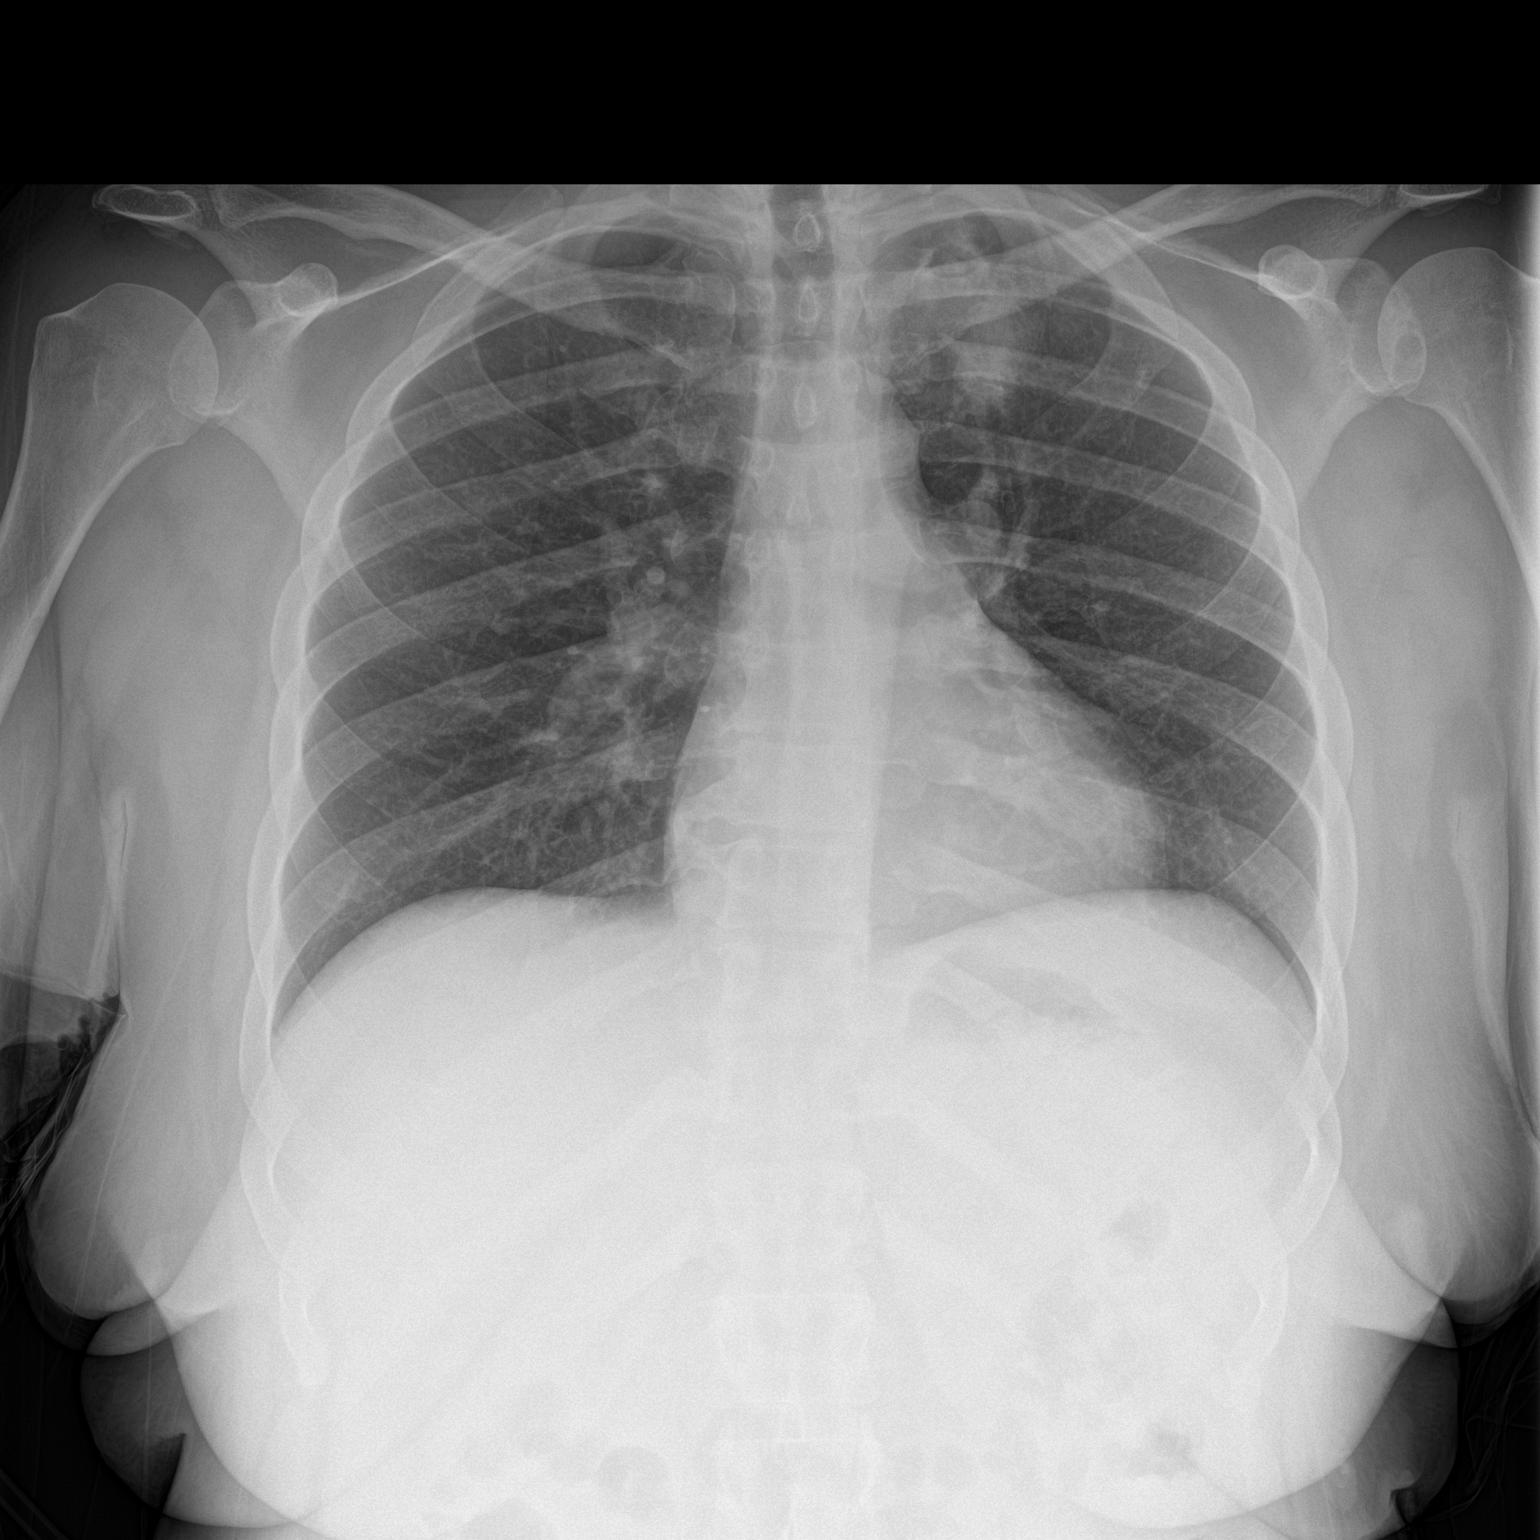

[chest lat]
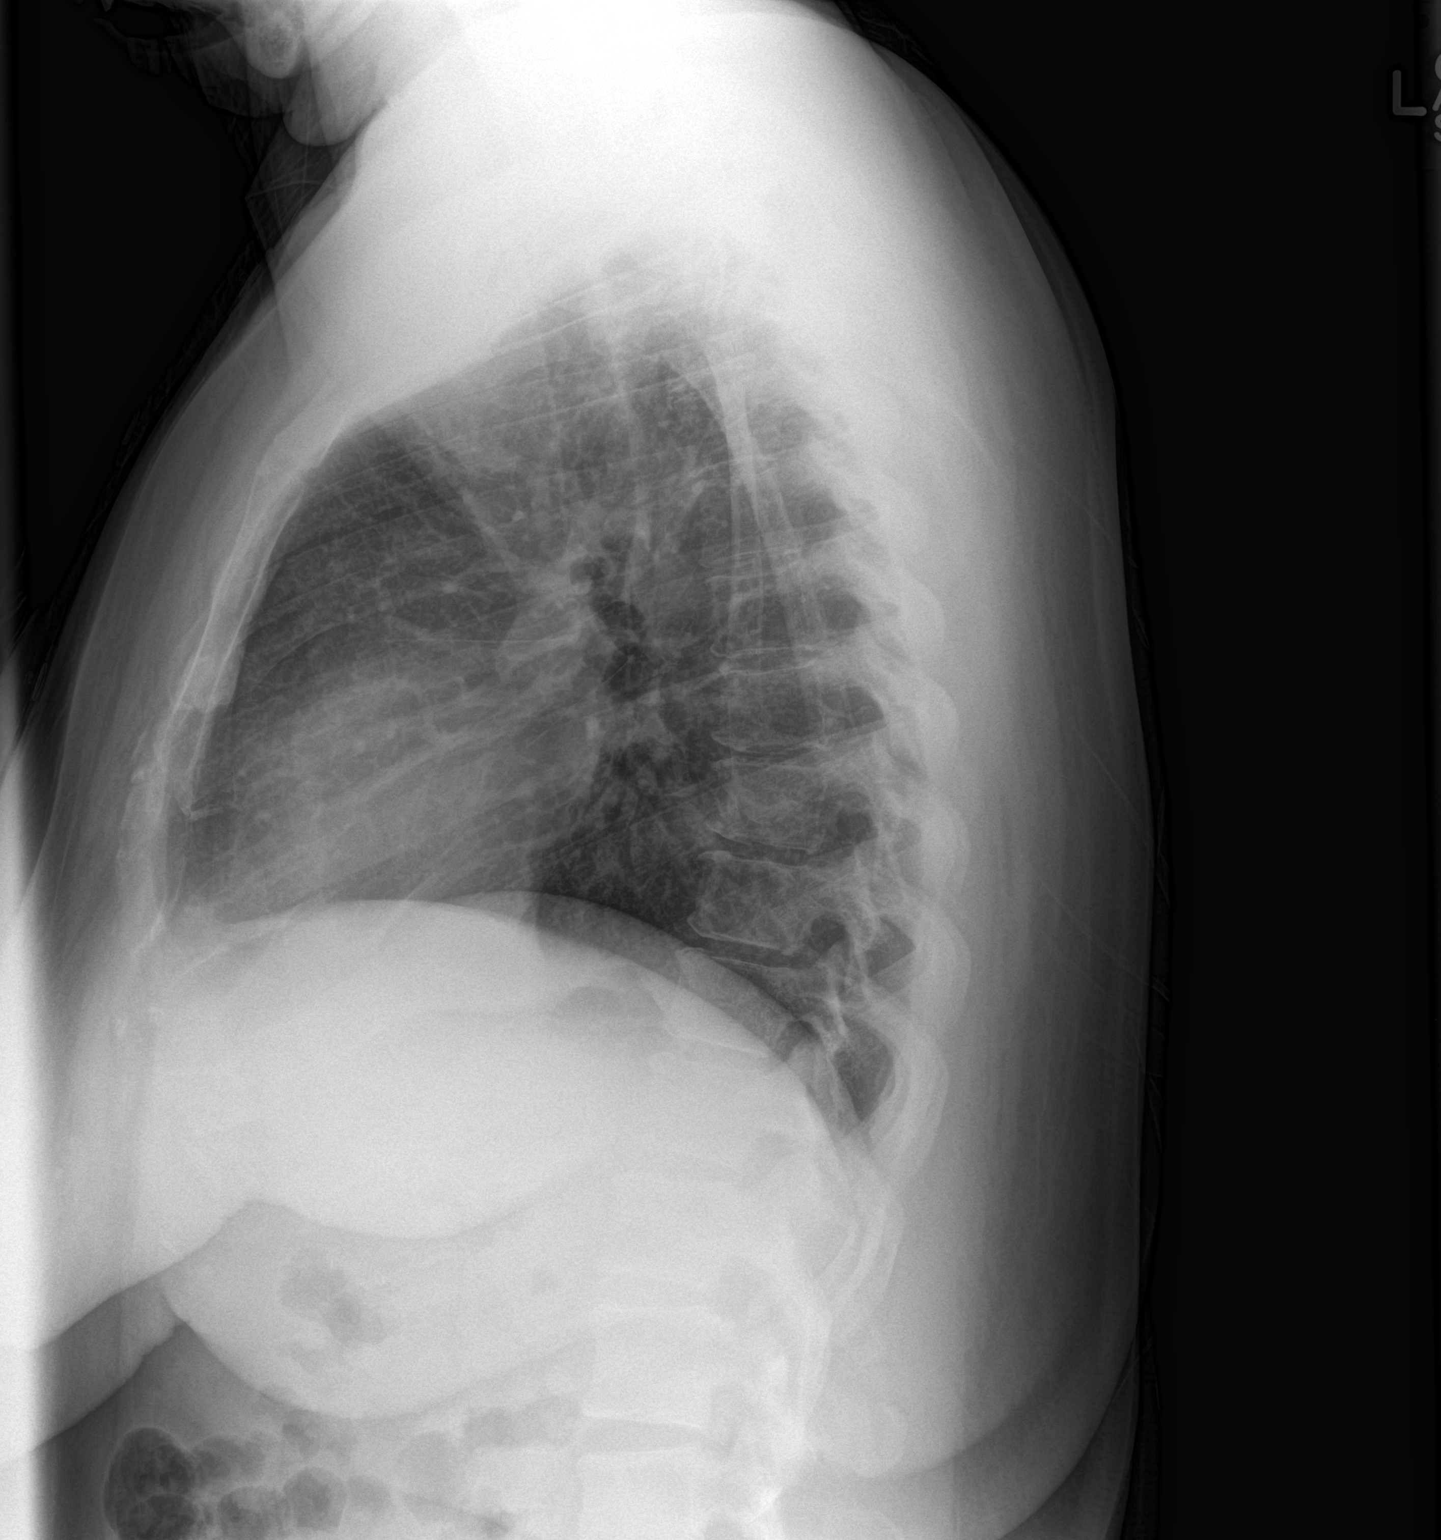

[2 of 2 positions shown; findings below may reference images not displayed]

FINDINGS: Heart size and mediastinal contours are normal. Developing opacity
within the left upper lobe in the projection of the anterior first
rib is identified and appears increased from 04/03/2014. Right lung
is clear. No pleural effusion identified.
IMPRESSION: 1. Developing a left upper lobe opacity is likely infectious in
etiology. Radiographic followup is recommended to ensure resolution.

## 2016-11-25 ENCOUNTER — Emergency Department (HOSPITAL_COMMUNITY)
Admission: EM | Admit: 2016-11-25 | Discharge: 2016-11-25 | Disposition: A | Discharge status: Home / Self Care | Payer: Medicaid Other | Attending: Emergency Medicine | Admitting: Emergency Medicine

## 2016-11-25 ENCOUNTER — Encounter (HOSPITAL_COMMUNITY): Payer: Self-pay

## 2016-11-25 DIAGNOSIS — R102 Pelvic and perineal pain: Secondary | ICD-10-CM | POA: Insufficient documentation

## 2016-11-25 DIAGNOSIS — Z87891 Personal history of nicotine dependence: Secondary | ICD-10-CM | POA: Insufficient documentation

## 2016-11-25 DIAGNOSIS — Z79899 Other long term (current) drug therapy: Secondary | ICD-10-CM | POA: Insufficient documentation

## 2016-11-25 DIAGNOSIS — Z7901 Long term (current) use of anticoagulants: Secondary | ICD-10-CM | POA: Insufficient documentation

## 2016-11-25 DIAGNOSIS — E785 Hyperlipidemia, unspecified: Secondary | ICD-10-CM | POA: Insufficient documentation

## 2016-11-25 DIAGNOSIS — E119 Type 2 diabetes mellitus without complications: Secondary | ICD-10-CM | POA: Insufficient documentation

## 2016-11-25 DIAGNOSIS — J45909 Unspecified asthma, uncomplicated: Secondary | ICD-10-CM | POA: Insufficient documentation

## 2016-11-25 DIAGNOSIS — Z8673 Personal history of transient ischemic attack (TIA), and cerebral infarction without residual deficits: Secondary | ICD-10-CM | POA: Insufficient documentation

## 2016-11-25 DIAGNOSIS — I10 Essential (primary) hypertension: Secondary | ICD-10-CM | POA: Insufficient documentation

## 2016-11-25 DIAGNOSIS — N939 Abnormal uterine and vaginal bleeding, unspecified: Secondary | ICD-10-CM

## 2016-11-25 DIAGNOSIS — I252 Old myocardial infarction: Secondary | ICD-10-CM | POA: Insufficient documentation

## 2016-11-25 DIAGNOSIS — I2510 Atherosclerotic heart disease of native coronary artery without angina pectoris: Secondary | ICD-10-CM | POA: Insufficient documentation

## 2016-11-25 LAB — COMPREHENSIVE METABOLIC PANEL
ALT: 9 U/L — AB (ref 14–54)
AST: 29 U/L (ref 15–41)
Albumin: 3.3 g/dL — ABNORMAL LOW (ref 3.5–5.0)
Alkaline Phosphatase: 51 U/L (ref 38–126)
Anion gap: 7 (ref 5–15)
BUN: 14 mg/dL (ref 6–20)
CHLORIDE: 102 mmol/L (ref 101–111)
CO2: 21 mmol/L — AB (ref 22–32)
CREATININE: 1.16 mg/dL — AB (ref 0.44–1.00)
Calcium: 9.3 mg/dL (ref 8.9–10.3)
GFR calc Af Amer: >60 mL/min (ref >60)
GFR calc non Af Amer: 57 mL/min — ABNORMAL LOW (ref >60)
Glucose, Bld: 344 mg/dL — ABNORMAL HIGH (ref 65–99)
POTASSIUM: 3.9 mmol/L (ref 3.5–5.1)
SODIUM: 130 mmol/L — AB (ref 135–145)
Total Bilirubin: 1.2 mg/dL (ref 0.3–1.2)
Total Protein: 7.8 g/dL (ref 6.5–8.1)

## 2016-11-25 LAB — CBC
HEMATOCRIT: 30.1 % — AB (ref 36.0–46.0)
Hemoglobin: 10.2 g/dL — ABNORMAL LOW (ref 12.0–15.0)
MCH: 25.8 pg — AB (ref 26.0–34.0)
MCHC: 33.9 g/dL (ref 30.0–36.0)
MCV: 76.2 fL — AB (ref 78.0–100.0)
Platelets: 352 10*3/uL (ref 150–400)
RBC: 3.95 MIL/uL (ref 3.87–5.11)
RDW: 23 % — AB (ref 11.5–15.5)
WBC: 7.3 10*3/uL (ref 4.0–10.5)

## 2016-11-25 LAB — URINALYSIS, ROUTINE W REFLEX MICROSCOPIC
Bilirubin Urine: NEGATIVE
Ketones, ur: NEGATIVE mg/dL
LEUKOCYTES UA: NEGATIVE
Nitrite: NEGATIVE
PH: 6 (ref 5.0–8.0)
PROTEIN: 100 mg/dL — AB
Specific Gravity, Urine: 1.02 (ref 1.005–1.030)

## 2016-11-25 LAB — URINALYSIS, MICROSCOPIC (REFLEX)

## 2016-11-25 LAB — LIPASE, BLOOD: LIPASE: 33 U/L (ref 11–51)

## 2016-11-25 LAB — I-STAT BETA HCG BLOOD, ED (MC, WL, AP ONLY): I-stat hCG, quantitative: <5 m[IU]/mL (ref <5)

## 2016-11-25 MED ORDER — SODIUM CHLORIDE 0.9 % IV BOLUS (SEPSIS)
1000.0000 mL | Freq: Once | INTRAVENOUS | Status: AC
  Administered 2016-11-25: 1000 mL via INTRAVENOUS

## 2016-11-25 MED ORDER — TRAMADOL HCL 50 MG PO TABS: 50.0000 mg | ORAL_TABLET | Freq: Four times a day (QID) | ORAL | 0 refills | Status: DC | PRN

## 2016-11-25 MED ORDER — ONDANSETRON HCL 4 MG/2ML IJ SOLN
4.0000 mg | Freq: Once | INTRAMUSCULAR | Status: AC
  Administered 2016-11-25: 4 mg via INTRAVENOUS
  Filled 2016-11-25: qty 2

## 2016-11-25 MED ORDER — HYDROMORPHONE HCL 1 MG/ML IJ SOLN
0.5000 mg | Freq: Once | INTRAMUSCULAR | Status: AC
  Administered 2016-11-25: 0.5 mg via INTRAVENOUS
  Filled 2016-11-25: qty 1

## 2016-11-25 NOTE — ED Notes (Signed)
Pt ambulatory to room with independent gait, NAD and then patient ambulatory to restroom with independent steady gait.

## 2016-11-25 NOTE — Discharge Instructions (Signed)
Your work up was reassuring today. Return to ED with any worsening of symptoms.

## 2016-11-25 NOTE — ED Provider Notes (Signed)
[facility] DEPT Provider Note   CSN: [id number] Arrival date & time: 11/25/16  1413     History   Chief Complaint Chief Complaint  Patient presents with  . Abdominal Pain  . Emesis      HPI Shirley [name] is a 41 y.o. Female.  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 Patient was admitted 7/21-7/28 For acute anemia, tachycardia, and orthostatic dizziness secondary to abnormal uterine bleeding. OB/GYN with consultation and patient was started on the gaze. Her anticoagulation was changed from [facility] to a look with her better tolerance with her uterine fibroid bleeding. Patient has subsequently followed up with her OB/GYN, with plans for hysterectomy in the next 1-2 months. Patient reports that her bleeding had slowed down, however should her right sided pelvic cramping and heavy clotting from her vagina recurred for the past 48 hours. She has run out of pain medicine she denies any fevers or chills, nausea, vomiting, vaginal discharge, or urinary symptoms. Past Medical History:  Diagnosis Date  . Abscess of tunica vaginalis    10/09- Abundant S. aureus- sensitive to all abx  . Anxiety   . Blood dyscrasia   . CAD (coronary artery disease) 06/15/2006   s/p Subendocardial MI with PDA angioplasty(no stent) on 06/15/06 and relook  cath 06/19/06 showed patency of site. Cath 12/10- no restenosis or significant CAD progression  . CVA (cerebral vascular accident) ([place]) ~ 02/2014   denies residual on 04/22/2014  . CVA (cerebral vascular accident) [facility])    history of remote right cerebellar infarct noted on head CT at least since 10/2011  . Depression   . Diabetes mellitus type 2, uncontrolled, with complications ([place])   . Fibromyalgia   . Gastritis   . Gastroparesis    secondary to poorly controlled DM, last  emptying study performed 01/2010  was normal but may be falsely positive as pt was on reglan  . GERD (gastroesophageal reflux disease)   . Hepatitis B, chronic (HCC)    Hep BeAb+,Hep B cAb+ & Hep BsAg+ (9/06)  . History of pyelonephritis    H/o GrpB Pyelonephritis (9/06) and UTI- 07/11- E.Coli, 12/10- GBS  . Hyperlipidemia   . Hypertension   . Iron deficiency anemia   . Irregular menses    Small ovarian follicles seen on AC(1/66)  . MI (myocardial infarction) ([place]) 05/2006   PDA percutaneous transluminal coronary angioplasty  . Migraine    "weekly" (04/22/2014)  . N&V (nausea and vomiting)    Chronic. Unclear etiology with multiple admission and ED visits. CT abdomen with and without contrast (02/2011)  showed no acute process. Gastic Emptying scan (01/2010) was normal. Ultrasound of the abdomen was within normal limits. Hepatitis B viral load was undectable. HIV NR. EGD - gastritis, Hpylori + s/p Rx  . Obesity   . OSA (obstructive sleep apnea)    "suppose to wear mask but I don't" (04/22/2014)  . Peripheral neuropathy   . Pneumonia    "this is probably the 2nd or 3rd time I've had pneumonia" (04/22/2014)  . Recurrent boils   . Seasonal asthma   . Substance abuse   . Thrombocytosis ([place])    Hem/Onc suggested 2/2 chronic hepatits and/or iron deficiency anemia    Patient Active Problem List   Diagnosis Date Noted  . Acute blood loss anemia 11/13/2016  . Menorrhagia 11/13/2016  .  Bilateral pulmonary embolism ([place]) 10/30/2016  . Acute DVT (deep venous thrombosis) ([place]) 10/30/2016  . Type 2 diabetes mellitus with hyperglycemia ([place]) 04/07/2016  . Diabetic gastroparesis ([place]) 04/06/2016  . Dehydration 03/27/2015  . Gastroparesis 11/11/2014  . Hematemesis 10/13/2014  . DKA (diabetic ketoacidoses) ([place]) 10/13/2014  . Tachycardia 07/21/2014  . Abdominal pain, chronic, left lower quadrant   . Diabetic gastroparesis associated with type 2 diabetes mellitus ([place]) 04/28/2014  .  History of Helicobacter pylori infection 04/22/2014  . Vaginal discharge 02/18/2014  . Atypical chest pain 07/22/2013  . Unspecified constipation 07/21/2013  . Tinea corporis 07/21/2013  . Intractable vomiting 04/29/2013  . Dysmenorrhea 04/22/2013  . UTI (urinary tract infection) 07/15/2012  . Headache(784.0) 02/08/2012  . Health care maintenance 01/22/2012  . Chronic hepatitis B ([place]) 03/07/2011  . History of leukocytosis 04/06/2010  . THROMBOCYTOSIS 04/06/2010  . Polysubstance abuse 02/23/2010  . Iron deficiency anemia 11/22/2009  . PERIPHERAL NEUROPATHY 10/01/2009  . Hyperlipidemia 08/30/2009  . Diabetic polyneuropathy ([place]) 08/30/2009  . Hidradenitis (recurrent boils) 07/07/2008  . Depression 12/27/2007  . Abdominal pain, left lower quadrant 11/21/2007  . FIBROMYALGIA 10/30/2007  . BACK PAIN 04/01/2007  . OBSTRUCTIVE SLEEP APNEA 01/17/2007  . ANXIETY DEPRESSION 06/27/2006  . Chronic ischemic heart disease 06/15/2006  . OBESITY, MORBID 05/15/2006  . MIGRAINE HEADACHE 05/15/2006  . Asthma 05/15/2006  . Essential hypertension 01/16/2006  . IRREGULAR MENSTRUATION 01/16/2006  . PEDAL EDEMA 01/16/2006  . Poorly controlled type 2 diabetes mellitus with peripheral neuropathy ([place]) 01/16/1989    Past Surgical History:  Procedure Laterality Date  . CESAREAN SECTION  1997  . CORONARY ANGIOPLASTY WITH STENT PLACEMENT  2008   "2 stents"  . ESOPHAGOGASTRODUODENOSCOPY N/A 04/23/2014   Procedure: ESOPHAGOGASTRODUODENOSCOPY (EGD);  Surgeon: Winfield Cunas., MD;  [facility] ENDOSCOPY;  Service: Endoscopy;  Laterality: N/A;    OB History    Gravida Para Term Preterm AB Living   _0 SAB TAB Ectopic Multiple Live Births           2       Home Medications    Prior to Admission medications   Medication Sig Start Date End Date Taking? Authorizing Provider  albuterol (PROVENTIL HFA;VENTOLIN HFA) 108 (90 Base) MCG/ACT inhaler Inhale 2 puffs into the lungs every 4  (four) hours as needed for wheezing or shortness of breath. Patient not taking: Reported on 11/20/2016 09/27/16   Clent Demark, PA-C  apixaban (ELIQUIS) 5 MG TABS tablet Take 1 tablet (5 mg total) by mouth 2 (two) times daily. 11/18/16   Collier Salina, MD  aspirin EC 81 MG tablet Take 1 tablet (81 mg total) by mouth daily. Patient not taking: Reported on 11/11/2016 09/27/16   Clent Demark, PA-C  clindamycin (CLINDAGEL) 1 % gel Apply topically 2 (two) times daily. Patient not taking: Reported on 11/20/2016 09/27/16   Clent Demark, PA-C  ferrous sulfate 325 (65 FE) MG tablet Take 1 tablet (325 mg total) by mouth 2 (two) times daily with a meal. 09/05/16   Clent Demark, PA-C  glucose blood (FREESTYLE TEST STRIPS) test strip Use as instructed 09/05/16   Clent Demark, PA-C  glucose monitoring kit (FREESTYLE) monitoring kit 1 each by Does not apply route 4 (four) times daily - after meals and at bedtime. 1 month Diabetic Testing Supplies for QAC-QHS accuchecks. 09/05/16   Clent Demark, PA-C  hydrochlorothiazide (HYDRODIURIL) 25 MG tablet  Take 1 tablet (25 mg total) by mouth daily. Take on tablet in the morning. Patient taking differently: Take 25 mg by mouth daily.  09/27/16   Clent Demark, PA-C  insulin NPH-regular Human (NOVOLIN 70/30) (70-30) 100 UNIT/ML injection 25 units in the morning and 25 units in the evening. Patient taking differently: Inject 25 Units into the skin 2 (two) times daily with a meal.  09/27/16   Clent Demark, PA-C  INSULIN SYRINGE .5CC/28G (INS SYRINGE/NEEDLE .5CC/28G) 28G X 1/2" 0.5 ML MISC Please provide 1 month supply 09/27/16   Clent Demark, PA-C  Lancets (FREESTYLE) lancets Use as instructed 09/27/16   Clent Demark, PA-C  losartan (COZAAR) 50 MG tablet Take 1 tablet (50 mg total) by mouth daily. 09/27/16   Clent Demark, PA-C  lovastatin (MEVACOR) 20 MG tablet Take 1 tablet (20 mg total) by mouth at bedtime. 09/27/16   Clent Demark, PA-C  megestrol (MEGACE) 40 MG tablet Take 2 tablets (80 mg total) by mouth 2 (two) times daily. 11/20/16 12/20/16  Lavonia Drafts, MD  metoCLOPramide (REGLAN) 10 MG tablet Take 1 tablet (10 mg total) by mouth 4 (four) times daily -  before meals and at bedtime. 09/27/16   Clent Demark, PA-C  metoprolol tartrate (LOPRESSOR) 50 MG tablet Take 1 tablet (50 mg total) by mouth 2 (two) times daily. 09/27/16   Clent Demark, PA-C  omeprazole (PRILOSEC) 40 MG capsule Take 1 capsule (40 mg total) by mouth daily. 09/27/16   Clent Demark, PA-C  ondansetron (ZOFRAN ODT) 8 MG disintegrating tablet Take 1 tablet (8 mg total) by mouth every 8 (eight) hours as needed for nausea or vomiting. 11/05/16   [name], MD  promethazine (PHENERGAN) 25 MG tablet Take 1 tablet (25 mg total) by mouth every 8 (eight) hours as needed for nausea or vomiting. 11/18/16 11/23/16  Collier Salina, MD  traMADol (ULTRAM) 50 MG tablet Take 1 tablet (50 mg total) by mouth every 6 (six) hours as needed. 11/25/16   Arnetha Massy, MD    Family History Family History  Problem Relation Age of Onset  . Diabetes Father     Social History Social History  Substance Use Topics  . Smoking status: Former Smoker    Types: Cigarettes    Quit date: 04/24/1996  . Smokeless tobacco: Never Used     Comment: quit smoking cigarettes age 62  . Alcohol use No     Comment: 04/22/2014 "might have a few drinks a month"     Allergies   Lisinopril and Morphine and related   Review of Systems Review of Systems  Constitutional: Negative for chills and fever.  HENT: Negative for ear pain and sore throat.   Eyes: Negative for pain and visual disturbance.  Respiratory: Negative for cough and shortness of breath.   Cardiovascular: Negative for chest pain and palpitations.  Gastrointestinal: Positive for abdominal pain. Negative for vomiting.  Genitourinary: Positive for pelvic pain and vaginal bleeding. Negative  for dysuria and hematuria.  Musculoskeletal: Negative for arthralgias and back pain.  Skin: Negative for color change and rash.  Neurological: Negative for seizures and syncope.  All other systems reviewed and are negative.    Physical Exam Updated Vital Signs BP (!) 158/100   Pulse 95   Temp 98.7 F (37.1 C) (Oral)   Resp 18   LMP 11/04/2016   SpO2 98%   Physical Exam  Constitutional: She appears well-developed and well-nourished. No  distress.  HENT:  Head: Normocephalic and atraumatic.  Eyes: Conjunctivae are normal.  Neck: Neck supple.  Cardiovascular: Normal rate and regular rhythm.   No murmur heard. Pulmonary/Chest: Effort normal and breath sounds normal. No respiratory distress.  Abdominal: Soft. There is tenderness (right lower abdominal).  Genitourinary: There is bleeding (scant blood, no active bleeding noted) in the vagina.  Musculoskeletal: She exhibits no edema.  Neurological: She is alert.  Skin: Skin is warm and dry.  Psychiatric: She has a normal mood and affect.  Nursing note and vitals reviewed.    ED Treatments / Results  Labs (all labs ordered are listed, but only abnormal results are displayed) Labs Reviewed  COMPREHENSIVE METABOLIC PANEL - Abnormal; Notable for the following:       Result Value   Sodium 130 (*)    CO2 21 (*)    Glucose, Bld 344 (*)    Creatinine, Ser 1.16 (*)    Albumin 3.3 (*)    ALT 9 (*)    GFR calc non Af Amer 57 (*)    All other components within normal limits  CBC - Abnormal; Notable for the following:    Hemoglobin 10.2 (*)    HCT 30.1 (*)    MCV 76.2 (*)    MCH 25.8 (*)    RDW 23.0 (*)    All other components within normal limits  URINALYSIS, ROUTINE W REFLEX MICROSCOPIC - Abnormal; Notable for the following:    APPearance HAZY (*)    Glucose, UA >=500 (*)    Hgb urine dipstick LARGE (*)    Protein, ur 100 (*)    All other components within normal limits  URINALYSIS, MICROSCOPIC (REFLEX) - Abnormal; Notable  for the following:    Bacteria, UA MANY (*)    Squamous Epithelial / LPF 6-30 (*)    All other components within normal limits  LIPASE, BLOOD  I-STAT BETA HCG BLOOD, ED ([facility], [facility], [facility] ONLY)    EKG  EKG Interpretation None       Radiology No results found.  Procedures Procedures (including critical care time)  Medications Ordered in ED Medications  sodium chloride 0.9 % bolus 1,000 mL (0 mLs Intravenous Stopped 11/25/16 2119)  ondansetron (ZOFRAN) injection 4 mg (4 mg Intravenous Given 11/25/16 1839)  HYDROmorphone (DILAUDID) injection 0.5 mg (0.5 mg Intravenous Given 11/25/16 1935)     Initial Impression / Assessment and Plan / ED Course  I have reviewed the triage vital signs and the nursing notes.  Pertinent labs & imaging results that were available during my care of the patient were reviewed by me and considered in my medical decision making (see chart for details).    Patient initially arrived tachycardic and in mild distress. Exam as above significant for mild left lower pelvic tenderness without any rebound or guarding. Pelvic exam with scant blood without any active hemorrhaging. Patient treated with IV fluids and pain control, with improvement in symptoms. Her hemoglobin has improved since her hospital admission. Improvement in tachycardia with fluids and pain control. I discussed with patient that I see no indication for admission at this time. However I would like her to have close follow-up with her OB/GYN. Recommended continuing her megace therapy. Given short course of tramadol for pain. Return precautions discussed. Patient in agreement with plan at time of discharge  Patient and plan of care discussed with Attending physician, Dr. Billy Fischer.     Final Clinical Impressions(s) / ED Diagnoses   Final diagnoses:  Abnormal uterine bleeding  Pelvic pain in female    New Prescriptions Discharge Medication List as of 11/25/2016  9:01 PM    START taking these  medications   Details  traMADol (ULTRAM) 50 MG tablet Take 1 tablet (50 mg total) by mouth every 6 (six) hours as needed., Starting Sat 11/25/2016, Print         Arnetha Massy, MD 11/26/16 1456    Gareth Morgan, MD 11/27/16 [phone]

## 2016-11-25 NOTE — ED Triage Notes (Addendum)
Patient complains of abdominal pain since release from hospital end of July. States that she was admitted for blood clots and since starting elaquis has had heavy vaginal bleeding with abdominal pain and vomiting. Alert and oriented, NAD. Reports any intake makes her more nausea and causes emesis

## 2016-11-25 NOTE — ED Notes (Signed)
Pt understood dc material. NAD noted. Scripts given at dc 

## 2016-11-25 NOTE — ED Notes (Signed)
EDP at bedside  

## 2016-11-26 IMAGING — DX DG CHEST 2V
2 series · 2 of 2 positions shown · non-contrast
Comparison: 04/21/2014

CLINICAL DATA: Cough, chest pain.

EXAM:
CHEST  2 VIEW

[chest pa]
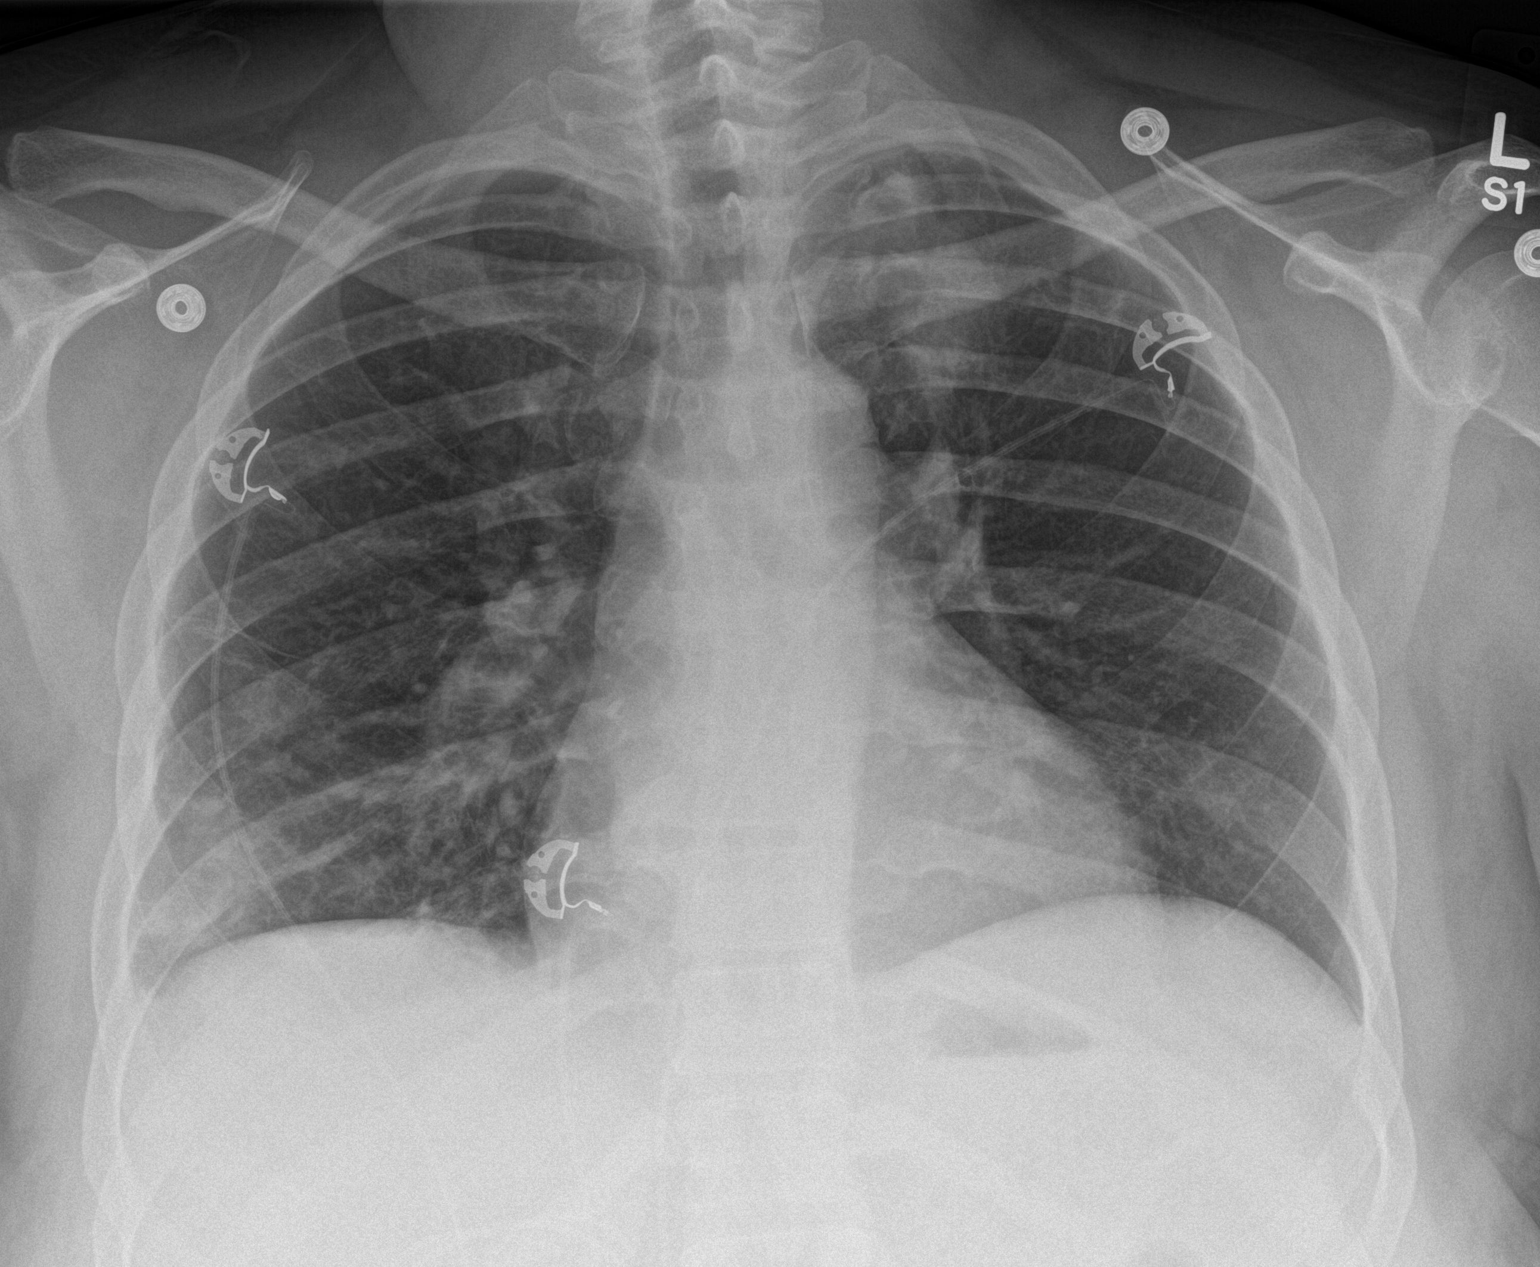

[chest lat]
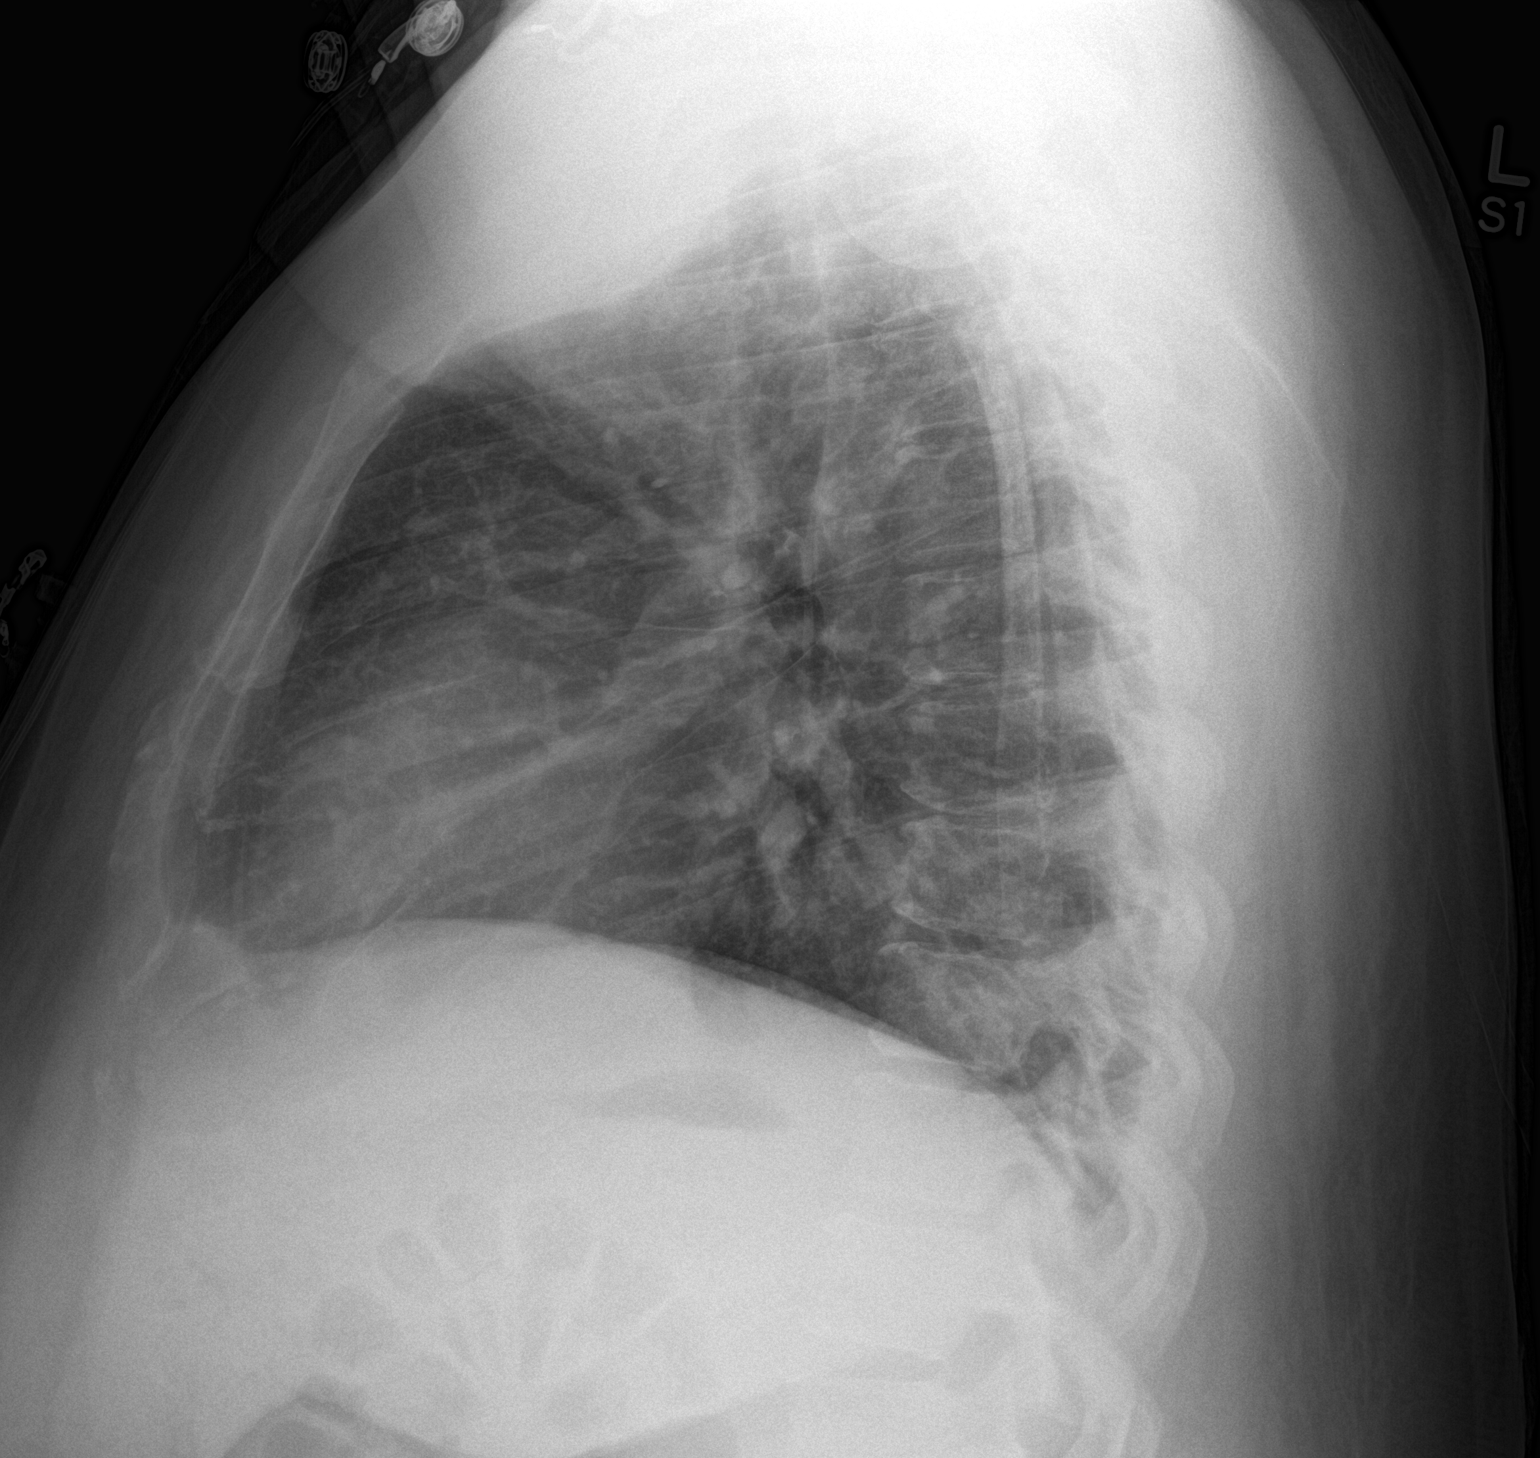

[2 of 2 positions shown; findings below may reference images not displayed]

FINDINGS: Stable cardiac and mediastinal contours. Interval development of
consolidative opacities within the peripheral right mid and lower
lung. Interval worsening of opacities within the left upper lung. No
definite pleural effusion or pneumothorax. Mid thoracic spine
degenerative changes. Leads overlie the patient.
IMPRESSION: Interval development of consolidative opacities within the
peripheral right mid and lower lung as well as increasing
consolidative opacities within the left upper lung, concerning for
multi focal pneumonia. Continued radiographic followup is
recommended to ensure resolution and exclude the possibility of
underlying mass.

## 2016-11-28 ENCOUNTER — Emergency Department (HOSPITAL_COMMUNITY): Payer: Self-pay

## 2016-11-28 ENCOUNTER — Emergency Department (HOSPITAL_COMMUNITY)
Admission: EM | Admit: 2016-11-28 | Discharge: 2016-11-29 | Disposition: A | Discharge status: Home / Self Care | Payer: Self-pay | Attending: Emergency Medicine | Admitting: Emergency Medicine

## 2016-11-28 ENCOUNTER — Encounter (HOSPITAL_COMMUNITY): Payer: Self-pay

## 2016-11-28 DIAGNOSIS — Z885 Allergy status to narcotic agent status: Secondary | ICD-10-CM | POA: Insufficient documentation

## 2016-11-28 DIAGNOSIS — Z8673 Personal history of transient ischemic attack (TIA), and cerebral infarction without residual deficits: Secondary | ICD-10-CM | POA: Insufficient documentation

## 2016-11-28 DIAGNOSIS — Z87891 Personal history of nicotine dependence: Secondary | ICD-10-CM | POA: Insufficient documentation

## 2016-11-28 DIAGNOSIS — R1031 Right lower quadrant pain: Secondary | ICD-10-CM | POA: Insufficient documentation

## 2016-11-28 DIAGNOSIS — R0602 Shortness of breath: Secondary | ICD-10-CM | POA: Insufficient documentation

## 2016-11-28 DIAGNOSIS — D259 Leiomyoma of uterus, unspecified: Secondary | ICD-10-CM | POA: Insufficient documentation

## 2016-11-28 DIAGNOSIS — Z7901 Long term (current) use of anticoagulants: Secondary | ICD-10-CM | POA: Insufficient documentation

## 2016-11-28 DIAGNOSIS — I2510 Atherosclerotic heart disease of native coronary artery without angina pectoris: Secondary | ICD-10-CM | POA: Insufficient documentation

## 2016-11-28 DIAGNOSIS — E1165 Type 2 diabetes mellitus with hyperglycemia: Secondary | ICD-10-CM | POA: Insufficient documentation

## 2016-11-28 DIAGNOSIS — Z794 Long term (current) use of insulin: Secondary | ICD-10-CM | POA: Insufficient documentation

## 2016-11-28 DIAGNOSIS — Z79899 Other long term (current) drug therapy: Secondary | ICD-10-CM | POA: Insufficient documentation

## 2016-11-28 DIAGNOSIS — R1011 Right upper quadrant pain: Secondary | ICD-10-CM

## 2016-11-28 LAB — COMPREHENSIVE METABOLIC PANEL
ALBUMIN: 3.1 g/dL — AB (ref 3.5–5.0)
ALK PHOS: 43 U/L (ref 38–126)
ALT: 10 U/L — AB (ref 14–54)
ANION GAP: 7 (ref 5–15)
AST: 20 U/L (ref 15–41)
BILIRUBIN TOTAL: 0.7 mg/dL (ref 0.3–1.2)
BUN: 21 mg/dL — AB (ref 6–20)
CALCIUM: 8.9 mg/dL (ref 8.9–10.3)
CO2: 22 mmol/L (ref 22–32)
CREATININE: 1.27 mg/dL — AB (ref 0.44–1.00)
Chloride: 106 mmol/L (ref 101–111)
GFR calc Af Amer: 59 mL/min — ABNORMAL LOW (ref >60)
GFR calc non Af Amer: 51 mL/min — ABNORMAL LOW (ref >60)
GLUCOSE: 318 mg/dL — AB (ref 65–99)
Potassium: 4.1 mmol/L (ref 3.5–5.1)
SODIUM: 135 mmol/L (ref 135–145)
TOTAL PROTEIN: 7.1 g/dL (ref 6.5–8.1)

## 2016-11-28 LAB — CBC
HEMATOCRIT: 26 % — AB (ref 36.0–46.0)
HEMOGLOBIN: 8.7 g/dL — AB (ref 12.0–15.0)
MCH: 25.4 pg — AB (ref 26.0–34.0)
MCHC: 33.5 g/dL (ref 30.0–36.0)
MCV: 76 fL — ABNORMAL LOW (ref 78.0–100.0)
Platelets: 411 10*3/uL — ABNORMAL HIGH (ref 150–400)
RBC: 3.42 MIL/uL — ABNORMAL LOW (ref 3.87–5.11)
RDW: 22.5 % — ABNORMAL HIGH (ref 11.5–15.5)
WBC: 7.9 10*3/uL (ref 4.0–10.5)

## 2016-11-28 LAB — I-STAT TROPONIN, ED: Troponin i, poc: 0.01 ng/mL (ref 0.00–0.08)

## 2016-11-28 MED ORDER — SODIUM CHLORIDE 0.9 % IV BOLUS (SEPSIS)
1000.0000 mL | Freq: Once | INTRAVENOUS | Status: AC
  Administered 2016-11-28: 1000 mL via INTRAVENOUS

## 2016-11-28 MED ORDER — FENTANYL CITRATE (PF) 100 MCG/2ML IJ SOLN
50.0000 ug | Freq: Once | INTRAMUSCULAR | Status: AC
  Administered 2016-11-28: 50 ug via INTRAVENOUS
  Filled 2016-11-28: qty 2

## 2016-11-28 MED ORDER — SODIUM CHLORIDE 0.9 % IV BOLUS (SEPSIS)
1000.0000 mL | Freq: Once | INTRAVENOUS | Status: AC
Start: 2016-11-29 — End: 2016-11-29
  Administered 2016-11-29: 1000 mL via INTRAVENOUS

## 2016-11-28 MED ORDER — ONDANSETRON HCL 4 MG/2ML IJ SOLN
4.0000 mg | Freq: Once | INTRAMUSCULAR | Status: AC
  Administered 2016-11-28: 4 mg via INTRAVENOUS
  Filled 2016-11-28: qty 2

## 2016-11-28 MED ORDER — ONDANSETRON HCL 4 MG/2ML IJ SOLN
4.0000 mg | Freq: Once | INTRAMUSCULAR | Status: AC
  Administered 2016-11-29: 4 mg via INTRAVENOUS
  Filled 2016-11-28: qty 2

## 2016-11-28 MED ORDER — FENTANYL CITRATE (PF) 100 MCG/2ML IJ SOLN
50.0000 ug | Freq: Once | INTRAMUSCULAR | Status: AC
  Administered 2016-11-29: 50 ug via INTRAVENOUS
  Filled 2016-11-28: qty 2

## 2016-11-28 NOTE — ED Triage Notes (Signed)
BIB [facility] from home with c/o chestpain for 3 hrs. The pain is sharp and does not radiate any where at this time. Pt also having about an hour ago SOB and nausea. Pt was given 324 asa. Two nitros which did not relieve pain. Pt was also given zofran oral.

## 2016-11-28 NOTE — ED Provider Notes (Signed)
[place] DEPT Provider Note   CSN: [id number] Arrival date & time: 11/28/16  2038     History   Chief Complaint Chief Complaint  Patient presents with  . Chest Pain    HPI Shirley [name] is a 41 y.o. female presenting via EMS with sudden onset chest pain and shortness of breath with nausea after sneezing and feeling like her legs were going to give out at 3 PM this afternoon. She reports a nonradiating squeezing stabbing chest pain and shortness of breath with nausea as well as abdominal pain. She reports one episode of vomiting once she got to the emergency department. She denies experiencing anything like this in the past. She reports right upper quadrant pain. Also reports a history of right lower extremity DVT and PE 2 weeks ago she is currently on anticoagulants.   HPI  Past Medical History:  Diagnosis Date  . Abscess of tunica vaginalis    10/09- Abundant S. aureus- sensitive to all abx  . Anxiety   . Blood dyscrasia   . CAD (coronary artery disease) 06/15/2006   s/p Subendocardial MI with PDA angioplasty(no stent) on 06/15/06 and relook  cath 06/19/06 showed patency of site. Cath 12/10- no restenosis or significant CAD progression  . CVA (cerebral vascular accident) ([place]) ~ 02/2014   denies residual on 04/22/2014  . CVA (cerebral vascular accident) [facility])    history of remote right cerebellar infarct noted on head CT at least since 10/2011  . Depression   . Diabetes mellitus type 2, uncontrolled, with complications ([place])   . Fibromyalgia   . Gastritis   . Gastroparesis    secondary to poorly controlled DM, last emptying study performed 01/2010  was normal but may be falsely positive as pt was on reglan  . GERD (gastroesophageal reflux disease)   . Hepatitis B, chronic (HCC)    Hep BeAb+,Hep B cAb+ & Hep BsAg+ (9/06)  . History of pyelonephritis    H/o GrpB Pyelonephritis (9/06) and UTI- 07/11- E.Coli, 12/10- GBS  . Hyperlipidemia   . Hypertension   . Iron  deficiency anemia   . Irregular menses    Small ovarian follicles seen on WJ(1/91)  . MI (myocardial infarction) ([place]) 05/2006   PDA percutaneous transluminal coronary angioplasty  . Migraine    "weekly" (04/22/2014)  . N&V (nausea and vomiting)    Chronic. Unclear etiology with multiple admission and ED visits. CT abdomen with and without contrast (02/2011)  showed no acute process. Gastic Emptying scan (01/2010) was normal. Ultrasound of the abdomen was within normal limits. Hepatitis B viral load was undectable. HIV NR. EGD - gastritis, Hpylori + s/p Rx  . Obesity   . OSA (obstructive sleep apnea)    "suppose to wear mask but I don't" (04/22/2014)  . Peripheral neuropathy   . Pneumonia    "this is probably the 2nd or 3rd time I've had pneumonia" (04/22/2014)  . Recurrent boils   . Seasonal asthma   . Substance abuse   . Thrombocytosis ([place])    Hem/Onc suggested 2/2 chronic hepatits and/or iron deficiency anemia    Patient Active Problem List   Diagnosis Date Noted  . Acute blood loss anemia 11/13/2016  . Menorrhagia 11/13/2016  . Bilateral pulmonary embolism ([place]) 10/30/2016  . Acute DVT (deep venous thrombosis) ([place]) 10/30/2016  . Type 2 diabetes mellitus with hyperglycemia ([place]) 04/07/2016  . Diabetic gastroparesis ([place]) 04/06/2016  . Dehydration 03/27/2015  . Gastroparesis 11/11/2014  . Hematemesis 10/13/2014  .  DKA (diabetic ketoacidoses) ([place]) 10/13/2014  . Tachycardia 07/21/2014  . Abdominal pain, chronic, left lower quadrant   . Diabetic gastroparesis associated with type 2 diabetes mellitus ([place]) 04/28/2014  . History of Helicobacter pylori infection 04/22/2014  . Vaginal discharge 02/18/2014  . Atypical chest pain 07/22/2013  . Unspecified constipation 07/21/2013  . Tinea corporis 07/21/2013  . Intractable vomiting 04/29/2013  . Dysmenorrhea 04/22/2013  . UTI (urinary tract infection) 07/15/2012  . Headache(784.0) 02/08/2012  . Health care maintenance  01/22/2012  . Chronic hepatitis B ([place]) 03/07/2011  . History of leukocytosis 04/06/2010  . THROMBOCYTOSIS 04/06/2010  . Polysubstance abuse 02/23/2010  . Iron deficiency anemia 11/22/2009  . PERIPHERAL NEUROPATHY 10/01/2009  . Hyperlipidemia 08/30/2009  . Diabetic polyneuropathy ([place]) 08/30/2009  . Hidradenitis (recurrent boils) 07/07/2008  . Depression 12/27/2007  . Abdominal pain, left lower quadrant 11/21/2007  . FIBROMYALGIA 10/30/2007  . BACK PAIN 04/01/2007  . OBSTRUCTIVE SLEEP APNEA 01/17/2007  . ANXIETY DEPRESSION 06/27/2006  . Chronic ischemic heart disease 06/15/2006  . OBESITY, MORBID 05/15/2006  . MIGRAINE HEADACHE 05/15/2006  . Asthma 05/15/2006  . Essential hypertension 01/16/2006  . IRREGULAR MENSTRUATION 01/16/2006  . PEDAL EDEMA 01/16/2006  . Poorly controlled type 2 diabetes mellitus with peripheral neuropathy ([place]) 01/16/1989    Past Surgical History:  Procedure Laterality Date  . CESAREAN SECTION  1997  . CORONARY ANGIOPLASTY WITH STENT PLACEMENT  2008   "2 stents"  . ESOPHAGOGASTRODUODENOSCOPY N/A 04/23/2014   Procedure: ESOPHAGOGASTRODUODENOSCOPY (EGD);  Surgeon: Winfield Cunas., MD;  Location: [facility] ENDOSCOPY;  Service: Endoscopy;  Laterality: N/A;    OB History    Gravida Para Term Preterm AB Living   _0 SAB TAB Ectopic Multiple Live Births           2       Home Medications    Prior to Admission medications   Medication Sig Start Date End Date Taking? Authorizing Provider  apixaban (ELIQUIS) 5 MG TABS tablet Take 1 tablet (5 mg total) by mouth 2 (two) times daily. 11/18/16  Yes Rice, Resa Miner, MD  ferrous sulfate 325 (65 FE) MG tablet Take 1 tablet (325 mg total) by mouth 2 (two) times daily with a meal. 09/05/16  Yes Clent Demark, PA-C  hydrochlorothiazide (HYDRODIURIL) 25 MG tablet Take 1 tablet (25 mg total) by mouth daily. Take on tablet in the morning. Patient taking differently: Take 25 mg by mouth daily.  09/27/16   Yes Clent Demark, PA-C  insulin NPH-regular Human (NOVOLIN 70/30) (70-30) 100 UNIT/ML injection 25 units in the morning and 25 units in the evening. Patient taking differently: Inject 25 Units into the skin 2 (two) times daily with a meal.  09/27/16  Yes Clent Demark, PA-C  losartan (COZAAR) 50 MG tablet Take 1 tablet (50 mg total) by mouth daily. 09/27/16  Yes Clent Demark, PA-C  lovastatin (MEVACOR) 20 MG tablet Take 1 tablet (20 mg total) by mouth at bedtime. 09/27/16  Yes Clent Demark, PA-C  megestrol (MEGACE) 40 MG tablet Take 2 tablets (80 mg total) by mouth 2 (two) times daily. 11/20/16 12/20/16 Yes Lavonia Drafts, MD  metoCLOPramide (REGLAN) 10 MG tablet Take 1 tablet (10 mg total) by mouth 4 (four) times daily -  before meals and at bedtime. 09/27/16  Yes Clent Demark, PA-C  metoprolol tartrate (LOPRESSOR) 50 MG tablet Take 1 tablet (50 mg total) by mouth 2 (two)  times daily. 09/27/16  Yes Clent Demark, PA-C  omeprazole (PRILOSEC) 40 MG capsule Take 1 capsule (40 mg total) by mouth daily. 09/27/16  Yes Clent Demark, PA-C  ondansetron (ZOFRAN ODT) 8 MG disintegrating tablet Take 1 tablet (8 mg total) by mouth every 8 (eight) hours as needed for nausea or vomiting. 11/05/16  Yes [name], MD  promethazine (PHENERGAN) 25 MG tablet Take 1 tablet (25 mg total) by mouth every 8 (eight) hours as needed for nausea or vomiting. 11/18/16 11/28/16 Yes Rice, Resa Miner, MD  albuterol (PROVENTIL HFA;VENTOLIN HFA) 108 (90 Base) MCG/ACT inhaler Inhale 2 puffs into the lungs every 4 (four) hours as needed for wheezing or shortness of breath. Patient not taking: Reported on 11/20/2016 09/27/16   Clent Demark, PA-C  aspirin EC 81 MG tablet Take 1 tablet (81 mg total) by mouth daily. Patient not taking: Reported on 11/11/2016 09/27/16   Clent Demark, PA-C  clindamycin (CLINDAGEL) 1 % gel Apply topically 2 (two) times daily. Patient not taking: Reported on  11/20/2016 09/27/16   Clent Demark, PA-C  glucose blood (FREESTYLE TEST STRIPS) test strip Use as instructed Patient not taking: Reported on 11/28/2016 09/05/16   Clent Demark, PA-C  glucose monitoring kit (FREESTYLE) monitoring kit 1 each by Does not apply route 4 (four) times daily - after meals and at bedtime. 1 month Diabetic Testing Supplies for QAC-QHS accuchecks. Patient not taking: Reported on 11/28/2016 09/05/16   Clent Demark, PA-C  INSULIN SYRINGE .5CC/28G (INS SYRINGE/NEEDLE .5CC/28G) 28G X 1/2" 0.5 ML MISC Please provide 1 month supply Patient not taking: Reported on 11/28/2016 09/27/16   Clent Demark, PA-C  Lancets (FREESTYLE) lancets Use as instructed Patient not taking: Reported on 11/28/2016 09/27/16   Clent Demark, PA-C  traMADol (ULTRAM) 50 MG tablet Take 1 tablet (50 mg total) by mouth every 6 (six) hours as needed. Patient not taking: Reported on 11/28/2016 11/25/16   Arnetha Massy, MD    Family History Family History  Problem Relation Age of Onset  . Diabetes Father     Social History Social History  Substance Use Topics  . Smoking status: Former Smoker    Types: Cigarettes    Quit date: 04/24/1996  . Smokeless tobacco: Never Used     Comment: quit smoking cigarettes age 52  . Alcohol use No     Comment: 04/22/2014 "might have a few drinks a month"     Allergies   Lisinopril and Morphine and related   Review of Systems Review of Systems  Constitutional: Negative for chills and fever.  HENT: Negative for ear pain and sore throat.   Eyes: Negative for pain and visual disturbance.  Respiratory: Positive for chest tightness and shortness of breath. Negative for cough, choking, wheezing and stridor.   Cardiovascular: Positive for chest pain. Negative for palpitations.  Gastrointestinal: Positive for abdominal pain, nausea and vomiting. Negative for abdominal distention.  Genitourinary: Negative for dysuria and hematuria.  Musculoskeletal:  Positive for myalgias. Negative for arthralgias and back pain.       She reports right calf pain ever since her DVT  Skin: Negative for color change, pallor and rash.  Neurological: Positive for light-headedness. Negative for dizziness, tremors, seizures, syncope, facial asymmetry, speech difficulty, weakness, numbness and headaches.     Physical Exam Updated Vital Signs BP (!) 151/97   Pulse (!) 129   Resp 16   Ht 5' 7" (1.702 m)   Wt 94.8  kg (209 lb)   LMP 11/04/2016   SpO2 99%   BMI 32.73 kg/m   Physical Exam  Constitutional: She appears well-developed and well-nourished. No distress.  Afebrile, nontoxic appearing tossing and turning in bed shaking her legs in discomfort. Speaking in full sentences without difficulty.  HENT:  Head: Normocephalic and atraumatic.  Eyes: Conjunctivae and EOM are normal.  Neck: Neck supple.  Cardiovascular: Normal rate, regular rhythm and normal heart sounds.   No murmur heard. Pulmonary/Chest: Effort normal. No respiratory distress. She has no wheezes. She has no rales. She exhibits no tenderness.  Abdominal: Soft. She exhibits no distension and no mass. There is tenderness. There is no rebound and no guarding.   Flat contour, active bowel sounds. abdomen is supple. no rebound tenderness. No palpated masses. No costovertebral angle tenderness. Tenderness palpation of the right upper quadrant. Positive murphy's sign Negative McBurney's point tenderness   Musculoskeletal: Normal range of motion. She exhibits no edema or deformity.  Neurological: She is alert.  Skin: Skin is warm and dry. No rash noted. She is not diaphoretic. No erythema. No pallor.  Psychiatric: She has a normal mood and affect.  Nursing note and vitals reviewed.    ED Treatments / Results  Labs (all labs ordered are listed, but only abnormal results are displayed) Labs Reviewed  CBC - Abnormal; Notable for the following:       Result Value   RBC 3.42 (*)    Hemoglobin  8.7 (*)    HCT 26.0 (*)    MCV 76.0 (*)    MCH 25.4 (*)    RDW 22.5 (*)    Platelets 411 (*)    All other components within normal limits  COMPREHENSIVE METABOLIC PANEL - Abnormal; Notable for the following:    Glucose, Bld 318 (*)    BUN 21 (*)    Creatinine, Ser 1.27 (*)    Albumin 3.1 (*)    ALT 10 (*)    GFR calc non Af Amer 51 (*)    GFR calc Af Amer 59 (*)    All other components within normal limits  HCG, SERUM, QUALITATIVE  I-STAT TROPONIN, ED  I-STAT TROPONIN, ED   Hemoglobin  Date Value Ref Range Status  11/28/2016 8.7 (L) 12.0 - 15.0 g/dL Final  11/25/2016 10.2 (L) 12.0 - 15.0 g/dL Final  11/17/2016 8.3 (L) 12.0 - 15.0 g/dL Final  11/16/2016 7.9 (L) 12.0 - 15.0 g/dL Final     EKG  EKG Interpretation  Date/Time:  Tuesday November 28 2016 20:47:57 EDT Ventricular Rate:  115 PR Interval:    QRS Duration: 76 QT Interval:  322 QTC Calculation: 446 R Axis:   77 Text Interpretation:  Sinus tachycardia since last tracing no significant change Confirmed by Daleen Bo [phone]) on 11/28/2016 8:55:49 PM       Radiology Dg Chest 2 View  Result Date: 11/28/2016 CLINICAL DATA:  Chest pain EXAM: CHEST  2 VIEW COMPARISON:  Chest radiograph 11/11/2016 FINDINGS: The heart size and mediastinal contours are within normal limits. Both lungs are clear. The visualized skeletal structures are unremarkable. IMPRESSION: No active cardiopulmonary disease. Electronically Signed   By: Ulyses Jarred M.D.   On: 11/28/2016 22:04   US Abdomen Limited Ruq  Result Date: 11/28/2016 CLINICAL DATA:  Right upper quadrant pain EXAM: ULTRASOUND ABDOMEN LIMITED RIGHT UPPER QUADRANT COMPARISON:  CT abdomen and pelvis November 05, 2016 FINDINGS: Gallbladder: No gallstones or wall thickening visualized. There is no pericholecystic fluid. No sonographic  Murphy sign noted by sonographer. Common bile duct: Diameter: 4 mm. No intrahepatic or extrahepatic biliary duct dilatation. Liver: No focal lesion  identified. Within normal limits in parenchymal echogenicity. Flow in the portal vein is in the anatomic direction. IMPRESSION: Study within normal limits. Electronically Signed   By: Lowella Grip III M.D.   On: 11/28/2016 21:53    Procedures Procedures (including critical care time)  Medications Ordered in ED Medications  HYDROmorphone (DILAUDID) injection 1 mg (not administered)  promethazine (PHENERGAN) injection 12.5 mg (not administered)  ondansetron (ZOFRAN) injection 4 mg (4 mg Intravenous Given 11/28/16 2203)  sodium chloride 0.9 % bolus 1,000 mL (0 mLs Intravenous Stopped 11/29/16 0013)  fentaNYL (SUBLIMAZE) injection 50 mcg (50 mcg Intravenous Given 11/28/16 2203)  sodium chloride 0.9 % bolus 1,000 mL (1,000 mLs Intravenous New Bag/Given 11/29/16 0013)  ondansetron (ZOFRAN) injection 4 mg (4 mg Intravenous Given 11/29/16 0013)  fentaNYL (SUBLIMAZE) injection 50 mcg (50 mcg Intravenous Given 11/29/16 0013)     Initial Impression / Assessment and Plan / ED Course  I have reviewed the triage vital signs and the nursing notes.  Pertinent labs & imaging results that were available during my care of the patient were reviewed by me and considered in my medical decision making (see chart for details).     Patient presenting with chest pain and abdominal pain with nausea vomiting. Patient had a recent DVT/PE and is currently on anticoagulants. On exam she is tachycardic and blood pressures elevated. She reports not taking any of her medications today. Positive murphy's signs.  Ultrasound negative. Patient was given anti-medic and analgesic and reported improvement thereafter.   On repeat exam she had tenderness to the right lower quadrant. Ordered CT abdomen and pelvis. Pain and nausea were managed on the ED.  Delta troponin negative. Negative chest x-ray. EKG unchanged from prior.  Transferred patient care at end of shift to [facility], PA-C, pending CT abdomen pelvis. Anticipate  discharge home if CT negative symptoms managed with normal vital signs Final Clinical Impressions(s) / ED Diagnoses   Final diagnoses:  RUQ pain    New Prescriptions New Prescriptions   No medications on file     Dossie Der 11/29/16 0867    Daleen Bo, MD 11/29/16 1324

## 2016-11-29 ENCOUNTER — Emergency Department (HOSPITAL_COMMUNITY): Payer: Self-pay

## 2016-11-29 LAB — I-STAT TROPONIN, ED: TROPONIN I, POC: 0 ng/mL (ref 0.00–0.08)

## 2016-11-29 LAB — HCG, QUANTITATIVE, PREGNANCY: HCG, BETA CHAIN, QUANT, S: 1 m[IU]/mL (ref <5)

## 2016-11-29 MED ORDER — HYDROMORPHONE HCL 1 MG/ML IJ SOLN
1.0000 mg | Freq: Once | INTRAMUSCULAR | Status: AC
  Administered 2016-11-29: 1 mg via INTRAVENOUS

## 2016-11-29 MED ORDER — HYDROCODONE-ACETAMINOPHEN 5-325 MG PO TABS: 1.0000 | ORAL_TABLET | ORAL | 0 refills | Status: DC | PRN

## 2016-11-29 MED ORDER — PROMETHAZINE HCL 25 MG/ML IJ SOLN
12.5000 mg | Freq: Once | INTRAMUSCULAR | Status: AC
  Administered 2016-11-29: 12.5 mg via INTRAVENOUS

## 2016-11-29 MED ORDER — OXYCODONE-ACETAMINOPHEN 5-325 MG PO TABS: 1.0000 | ORAL_TABLET | ORAL | 0 refills | Status: DC | PRN

## 2016-11-29 MED ORDER — PROMETHAZINE HCL 25 MG/ML IJ SOLN
INTRAMUSCULAR | Status: DC
Start: 2016-11-29 — End: 2016-11-29
  Filled 2016-11-29: qty 1

## 2016-11-29 MED ORDER — APIXABAN 5 MG PO TABS
5.0000 mg | ORAL_TABLET | Freq: Once | ORAL | Status: AC
  Administered 2016-11-29: 5 mg via ORAL

## 2016-11-29 MED ORDER — HYDROMORPHONE HCL 1 MG/ML IJ SOLN
0.5000 mg | Freq: Once | INTRAMUSCULAR | Status: AC
  Administered 2016-11-29: 0.5 mg via INTRAVENOUS
  Filled 2016-11-29: qty 1

## 2016-11-29 MED ORDER — HYDROMORPHONE HCL 1 MG/ML IJ SOLN
INTRAMUSCULAR | Status: AC
  Filled 2016-11-29: qty 1

## 2016-11-29 MED ORDER — IOPAMIDOL (ISOVUE-300) INJECTION 61%
INTRAVENOUS | Status: AC
  Administered 2016-11-29: 100 mL
  Filled 2016-11-29: qty 100

## 2016-11-29 NOTE — ED Notes (Signed)
PT states understanding of care given, follow up care, and medication prescribed. PT ambulated from ED to car with a steady gait. 

## 2016-11-29 NOTE — Discharge Instructions (Signed)
Follow up with your OB/GYN for further management of uterine fibroids and pain management. Return here or go to [facility] with any worsening symptoms thought related to your fibroids.

## 2016-11-29 NOTE — ED Provider Notes (Signed)
CP, SOB, sneezing, feeling like she was going to pass out RUQ pain - [place] neg for chole RLQ pain - CT pending.  Recent diagnosis of PE on Eliquis - reports compliance SOB - CP - tachycardic here. Chart reviewed. Patient has frequent documented tachycardia. On re-questioning, the patient reports that her chest pain of complaint tonight is the same pain as when diagnosed with PE and since. No new symptoms.   Pending CT to evaluate RLQ abdominal pain. Pain and nausea medication provided.  CT negative for acute findings. Showing fibroids. On re-evaluation of the patient, she is resting more comfortably. Pain is returning - will order additional pain medications.   She discloses that she is under PCP care of [facility], on megace to attempt control of bleeding. Feel it is likely that abdominal pain is due to fibroids and bleeding. She is under OB/GYN care and is expected to have a hysterectomy when determined to be appropriate given her bleeding and in the setting of anticoagulation. Hgb appears baseline tonight.   Charlann Lange, PA-C 11/29/16 0503    Charlann Lange, PA-C 11/29/16 8638    Daleen Bo, MD 11/29/16 1325

## 2016-12-02 ENCOUNTER — Emergency Department (HOSPITAL_COMMUNITY): Payer: Self-pay

## 2016-12-02 ENCOUNTER — Encounter (HOSPITAL_COMMUNITY): Payer: Self-pay | Admitting: Emergency Medicine

## 2016-12-02 ENCOUNTER — Inpatient Hospital Stay (HOSPITAL_COMMUNITY)
Admission: EM | Admit: 2016-12-02 | Discharge: 2016-12-11 | DRG: 749 | Disposition: A | Discharge status: Home / Self Care | Payer: Self-pay | Attending: Student in an Organized Health Care Education/Training Program | Admitting: Student in an Organized Health Care Education/Training Program

## 2016-12-02 DIAGNOSIS — N920 Excessive and frequent menstruation with regular cycle: Secondary | ICD-10-CM | POA: Diagnosis present

## 2016-12-02 DIAGNOSIS — B181 Chronic viral hepatitis B without delta-agent: Secondary | ICD-10-CM | POA: Diagnosis present

## 2016-12-02 DIAGNOSIS — Z6832 Body mass index (BMI) 32.0-32.9, adult: Secondary | ICD-10-CM

## 2016-12-02 DIAGNOSIS — D509 Iron deficiency anemia, unspecified: Secondary | ICD-10-CM | POA: Diagnosis present

## 2016-12-02 DIAGNOSIS — I10 Essential (primary) hypertension: Secondary | ICD-10-CM | POA: Diagnosis present

## 2016-12-02 DIAGNOSIS — G43909 Migraine, unspecified, not intractable, without status migrainosus: Secondary | ICD-10-CM | POA: Diagnosis present

## 2016-12-02 DIAGNOSIS — Z885 Allergy status to narcotic agent status: Secondary | ICD-10-CM

## 2016-12-02 DIAGNOSIS — E860 Dehydration: Secondary | ICD-10-CM | POA: Diagnosis present

## 2016-12-02 DIAGNOSIS — Z955 Presence of coronary angioplasty implant and graft: Secondary | ICD-10-CM

## 2016-12-02 DIAGNOSIS — G4733 Obstructive sleep apnea (adult) (pediatric): Secondary | ICD-10-CM | POA: Diagnosis present

## 2016-12-02 DIAGNOSIS — Z86718 Personal history of other venous thrombosis and embolism: Secondary | ICD-10-CM

## 2016-12-02 DIAGNOSIS — N939 Abnormal uterine and vaginal bleeding, unspecified: Secondary | ICD-10-CM

## 2016-12-02 DIAGNOSIS — E785 Hyperlipidemia, unspecified: Secondary | ICD-10-CM | POA: Diagnosis present

## 2016-12-02 DIAGNOSIS — E119 Type 2 diabetes mellitus without complications: Secondary | ICD-10-CM | POA: Diagnosis present

## 2016-12-02 DIAGNOSIS — M797 Fibromyalgia: Secondary | ICD-10-CM | POA: Diagnosis present

## 2016-12-02 DIAGNOSIS — Z86711 Personal history of pulmonary embolism: Secondary | ICD-10-CM

## 2016-12-02 DIAGNOSIS — N921 Excessive and frequent menstruation with irregular cycle: Secondary | ICD-10-CM | POA: Diagnosis present

## 2016-12-02 DIAGNOSIS — E1142 Type 2 diabetes mellitus with diabetic polyneuropathy: Secondary | ICD-10-CM | POA: Diagnosis present

## 2016-12-02 DIAGNOSIS — D259 Leiomyoma of uterus, unspecified: Principal | ICD-10-CM | POA: Diagnosis present

## 2016-12-02 DIAGNOSIS — Z794 Long term (current) use of insulin: Secondary | ICD-10-CM

## 2016-12-02 DIAGNOSIS — J45909 Unspecified asthma, uncomplicated: Secondary | ICD-10-CM | POA: Diagnosis present

## 2016-12-02 DIAGNOSIS — E669 Obesity, unspecified: Secondary | ICD-10-CM | POA: Diagnosis present

## 2016-12-02 DIAGNOSIS — E1143 Type 2 diabetes mellitus with diabetic autonomic (poly)neuropathy: Secondary | ICD-10-CM | POA: Diagnosis present

## 2016-12-02 DIAGNOSIS — R112 Nausea with vomiting, unspecified: Secondary | ICD-10-CM

## 2016-12-02 DIAGNOSIS — Z87891 Personal history of nicotine dependence: Secondary | ICD-10-CM

## 2016-12-02 DIAGNOSIS — Z8673 Personal history of transient ischemic attack (TIA), and cerebral infarction without residual deficits: Secondary | ICD-10-CM

## 2016-12-02 DIAGNOSIS — K219 Gastro-esophageal reflux disease without esophagitis: Secondary | ICD-10-CM | POA: Diagnosis present

## 2016-12-02 DIAGNOSIS — I252 Old myocardial infarction: Secondary | ICD-10-CM

## 2016-12-02 DIAGNOSIS — IMO0001 Reserved for inherently not codable concepts without codable children: Secondary | ICD-10-CM

## 2016-12-02 DIAGNOSIS — I2510 Atherosclerotic heart disease of native coronary artery without angina pectoris: Secondary | ICD-10-CM | POA: Diagnosis present

## 2016-12-02 DIAGNOSIS — E1165 Type 2 diabetes mellitus with hyperglycemia: Secondary | ICD-10-CM

## 2016-12-02 DIAGNOSIS — K3184 Gastroparesis: Secondary | ICD-10-CM | POA: Diagnosis present

## 2016-12-02 DIAGNOSIS — Z888 Allergy status to other drugs, medicaments and biological substances status: Secondary | ICD-10-CM

## 2016-12-02 DIAGNOSIS — Z7901 Long term (current) use of anticoagulants: Secondary | ICD-10-CM

## 2016-12-02 LAB — COMPREHENSIVE METABOLIC PANEL
ALBUMIN: 3.4 g/dL — AB (ref 3.5–5.0)
ALT: 9 U/L — ABNORMAL LOW (ref 14–54)
AST: 19 U/L (ref 15–41)
Alkaline Phosphatase: 50 U/L (ref 38–126)
Anion gap: 8 (ref 5–15)
BILIRUBIN TOTAL: 0.5 mg/dL (ref 0.3–1.2)
BUN: 13 mg/dL (ref 6–20)
CO2: 22 mmol/L (ref 22–32)
CREATININE: 1.14 mg/dL — AB (ref 0.44–1.00)
Calcium: 9.6 mg/dL (ref 8.9–10.3)
Chloride: 105 mmol/L (ref 101–111)
GFR calc Af Amer: >60 mL/min (ref >60)
GFR, EST NON AFRICAN AMERICAN: 58 mL/min — AB (ref >60)
GLUCOSE: 370 mg/dL — AB (ref 65–99)
POTASSIUM: 4.2 mmol/L (ref 3.5–5.1)
Sodium: 135 mmol/L (ref 135–145)
TOTAL PROTEIN: 7.8 g/dL (ref 6.5–8.1)

## 2016-12-02 LAB — URINALYSIS, MICROSCOPIC (REFLEX)
Bacteria, UA: NONE SEEN
SQUAMOUS EPITHELIAL / LPF: NONE SEEN
WBC, UA: NONE SEEN WBC/hpf (ref 0–5)

## 2016-12-02 LAB — URINALYSIS, ROUTINE W REFLEX MICROSCOPIC
BILIRUBIN URINE: NEGATIVE
GLUCOSE, UA: NEGATIVE mg/dL
KETONES UR: NEGATIVE mg/dL
NITRITE: NEGATIVE
PH: 6 (ref 5.0–8.0)
PROTEIN: 100 mg/dL — AB
Specific Gravity, Urine: 1.01 (ref 1.005–1.030)

## 2016-12-02 LAB — CBC
HEMATOCRIT: 29.6 % — AB (ref 36.0–46.0)
Hemoglobin: 10 g/dL — ABNORMAL LOW (ref 12.0–15.0)
MCH: 26 pg (ref 26.0–34.0)
MCHC: 33.8 g/dL (ref 30.0–36.0)
MCV: 76.9 fL — AB (ref 78.0–100.0)
PLATELETS: 494 10*3/uL — AB (ref 150–400)
RBC: 3.85 MIL/uL — ABNORMAL LOW (ref 3.87–5.11)
RDW: 21.7 % — AB (ref 11.5–15.5)
WBC: 8.1 10*3/uL (ref 4.0–10.5)

## 2016-12-02 LAB — I-STAT TROPONIN, ED: Troponin i, poc: 0 ng/mL (ref 0.00–0.08)

## 2016-12-02 LAB — MAGNESIUM: Magnesium: 1.9 mg/dL (ref 1.7–2.4)

## 2016-12-02 LAB — GLUCOSE, CAPILLARY
Glucose-Capillary: 165 mg/dL — ABNORMAL HIGH (ref 65–99)
Glucose-Capillary: 126 mg/dL — ABNORMAL HIGH (ref 65–99)

## 2016-12-02 LAB — LIPASE, BLOOD: Lipase: 55 U/L — ABNORMAL HIGH (ref 11–51)

## 2016-12-02 MED ORDER — ONDANSETRON HCL 4 MG PO TABS
4.0000 mg | ORAL_TABLET | Freq: Four times a day (QID) | ORAL | Status: DC | PRN
  Administered 2016-12-03 – 2016-12-10 (×4): 4 mg via ORAL
  Filled 2016-12-02 (×4): qty 1

## 2016-12-02 MED ORDER — HYDROMORPHONE HCL 1 MG/ML IJ SOLN
1.0000 mg | Freq: Once | INTRAMUSCULAR | Status: AC
  Administered 2016-12-02: 1 mg via INTRAVENOUS
  Filled 2016-12-02: qty 1

## 2016-12-02 MED ORDER — FERROUS SULFATE 325 (65 FE) MG PO TABS
325.0000 mg | ORAL_TABLET | Freq: Two times a day (BID) | ORAL | Status: DC
  Administered 2016-12-02 – 2016-12-11 (×16): 325 mg via ORAL
  Filled 2016-12-02 (×17): qty 1

## 2016-12-02 MED ORDER — INSULIN ASPART 100 UNIT/ML ~~LOC~~ SOLN
0.0000 [IU] | Freq: Three times a day (TID) | SUBCUTANEOUS | Status: DC
  Administered 2016-12-02: 2 [IU] via SUBCUTANEOUS
  Administered 2016-12-03: 7 [IU] via SUBCUTANEOUS
  Administered 2016-12-03: 2 [IU] via SUBCUTANEOUS
  Administered 2016-12-03: 3 [IU] via SUBCUTANEOUS
  Administered 2016-12-04: 9 [IU] via SUBCUTANEOUS
  Administered 2016-12-04: 7 [IU] via SUBCUTANEOUS
  Administered 2016-12-04: 5 [IU] via SUBCUTANEOUS
  Administered 2016-12-05: 2 [IU] via SUBCUTANEOUS
  Administered 2016-12-05 (×2): 3 [IU] via SUBCUTANEOUS
  Administered 2016-12-06: 2 [IU] via SUBCUTANEOUS
  Administered 2016-12-06: 3 [IU] via SUBCUTANEOUS
  Administered 2016-12-06 – 2016-12-07 (×2): 2 [IU] via SUBCUTANEOUS
  Administered 2016-12-07 (×2): 3 [IU] via SUBCUTANEOUS
  Administered 2016-12-08: 1 [IU] via SUBCUTANEOUS
  Administered 2016-12-08: 2 [IU] via SUBCUTANEOUS
  Administered 2016-12-09: 1 [IU] via SUBCUTANEOUS
  Administered 2016-12-09 – 2016-12-10 (×2): 5 [IU] via SUBCUTANEOUS
  Administered 2016-12-10: 3 [IU] via SUBCUTANEOUS
  Administered 2016-12-10: 1 [IU] via SUBCUTANEOUS
  Administered 2016-12-11: 3 [IU] via SUBCUTANEOUS
  Administered 2016-12-11 (×2): 2 [IU] via SUBCUTANEOUS

## 2016-12-02 MED ORDER — MEGESTROL ACETATE 40 MG PO TABS
80.0000 mg | ORAL_TABLET | Freq: Two times a day (BID) | ORAL | Status: DC
  Administered 2016-12-02 – 2016-12-11 (×17): 80 mg via ORAL
  Filled 2016-12-02 (×20): qty 2

## 2016-12-02 MED ORDER — SODIUM CHLORIDE 0.9 % IV BOLUS (SEPSIS)
1000.0000 mL | Freq: Once | INTRAVENOUS | Status: AC
  Administered 2016-12-02: 1000 mL via INTRAVENOUS

## 2016-12-02 MED ORDER — ONDANSETRON HCL 4 MG/2ML IJ SOLN
4.0000 mg | Freq: Once | INTRAMUSCULAR | Status: AC
  Administered 2016-12-02: 4 mg via INTRAVENOUS
  Filled 2016-12-02: qty 2

## 2016-12-02 MED ORDER — ACETAMINOPHEN 325 MG PO TABS: 650.0000 mg | ORAL_TABLET | Freq: Four times a day (QID) | ORAL | Status: DC | PRN

## 2016-12-02 MED ORDER — SENNA 8.6 MG PO TABS
1.0000 | ORAL_TABLET | Freq: Two times a day (BID) | ORAL | Status: DC
  Administered 2016-12-02 – 2016-12-11 (×16): 8.6 mg via ORAL
  Filled 2016-12-02 (×18): qty 1

## 2016-12-02 MED ORDER — PROMETHAZINE HCL 25 MG/ML IJ SOLN
12.5000 mg | Freq: Four times a day (QID) | INTRAMUSCULAR | Status: DC | PRN
  Administered 2016-12-03 – 2016-12-11 (×24): 12.5 mg via INTRAVENOUS
  Filled 2016-12-02 (×24): qty 1

## 2016-12-02 MED ORDER — METOPROLOL TARTRATE 50 MG PO TABS
50.0000 mg | ORAL_TABLET | Freq: Two times a day (BID) | ORAL | Status: DC
  Administered 2016-12-02 – 2016-12-11 (×17): 50 mg via ORAL
  Filled 2016-12-02 (×19): qty 1

## 2016-12-02 MED ORDER — PRAVASTATIN SODIUM 20 MG PO TABS
20.0000 mg | ORAL_TABLET | Freq: Every day | ORAL | Status: DC
  Administered 2016-12-02 – 2016-12-11 (×9): 20 mg via ORAL
  Filled 2016-12-02 (×9): qty 1

## 2016-12-02 MED ORDER — PANTOPRAZOLE SODIUM 40 MG PO TBEC
80.0000 mg | DELAYED_RELEASE_TABLET | Freq: Every day | ORAL | Status: DC
  Administered 2016-12-02 – 2016-12-11 (×9): 80 mg via ORAL
  Filled 2016-12-02 (×10): qty 2

## 2016-12-02 MED ORDER — POLYETHYLENE GLYCOL 3350 17 G PO PACK
17.0000 g | PACK | Freq: Every day | ORAL | Status: DC
  Administered 2016-12-05: 17 g via ORAL
  Filled 2016-12-02 (×11): qty 1

## 2016-12-02 MED ORDER — METOCLOPRAMIDE HCL 10 MG PO TABS
10.0000 mg | ORAL_TABLET | Freq: Three times a day (TID) | ORAL | Status: DC
  Administered 2016-12-02 – 2016-12-11 (×32): 10 mg via ORAL
  Filled 2016-12-02 (×34): qty 1

## 2016-12-02 MED ORDER — SODIUM CHLORIDE 0.9 % IV SOLN
INTRAVENOUS | Status: DC
  Administered 2016-12-03: 02:00:00 via INTRAVENOUS

## 2016-12-02 MED ORDER — ALBUTEROL SULFATE (2.5 MG/3ML) 0.083% IN NEBU: 3.0000 mL | INHALATION_SOLUTION | RESPIRATORY_TRACT | Status: DC | PRN

## 2016-12-02 MED ORDER — APIXABAN 5 MG PO TABS
5.0000 mg | ORAL_TABLET | Freq: Once | ORAL | Status: AC
  Administered 2016-12-02: 5 mg via ORAL
  Filled 2016-12-02: qty 1

## 2016-12-02 MED ORDER — ONDANSETRON HCL 4 MG/2ML IJ SOLN
4.0000 mg | Freq: Four times a day (QID) | INTRAMUSCULAR | Status: DC | PRN
  Administered 2016-12-03 – 2016-12-10 (×9): 4 mg via INTRAVENOUS
  Filled 2016-12-02 (×8): qty 2

## 2016-12-02 MED ORDER — APIXABAN 5 MG PO TABS
5.0000 mg | ORAL_TABLET | Freq: Two times a day (BID) | ORAL | Status: DC
  Administered 2016-12-02 – 2016-12-05 (×6): 5 mg via ORAL
  Filled 2016-12-02 (×6): qty 1

## 2016-12-02 MED ORDER — INSULIN ASPART PROT & ASPART (70-30 MIX) 100 UNIT/ML ~~LOC~~ SUSP
15.0000 [IU] | Freq: Two times a day (BID) | SUBCUTANEOUS | Status: DC
  Administered 2016-12-02 – 2016-12-04 (×4): 15 [IU] via SUBCUTANEOUS
  Filled 2016-12-02: qty 10

## 2016-12-02 MED ORDER — PROMETHAZINE HCL 25 MG/ML IJ SOLN
25.0000 mg | Freq: Once | INTRAMUSCULAR | Status: AC
  Administered 2016-12-02: 25 mg via INTRAVENOUS
  Filled 2016-12-02: qty 1

## 2016-12-02 MED ORDER — ASPIRIN EC 81 MG PO TBEC
81.0000 mg | DELAYED_RELEASE_TABLET | Freq: Every day | ORAL | Status: DC
  Administered 2016-12-02 – 2016-12-11 (×9): 81 mg via ORAL
  Filled 2016-12-02 (×10): qty 1

## 2016-12-02 MED ORDER — TRAMADOL HCL 50 MG PO TABS
50.0000 mg | ORAL_TABLET | Freq: Four times a day (QID) | ORAL | Status: DC | PRN
  Administered 2016-12-02 – 2016-12-10 (×17): 50 mg via ORAL
  Filled 2016-12-02 (×18): qty 1

## 2016-12-02 MED ORDER — SODIUM CHLORIDE 0.9 % IV SOLN
Freq: Once | INTRAVENOUS | Status: AC
  Administered 2016-12-02: 14:00:00 via INTRAVENOUS

## 2016-12-02 MED ORDER — ACETAMINOPHEN 650 MG RE SUPP: 650.0000 mg | Freq: Four times a day (QID) | RECTAL | Status: DC | PRN

## 2016-12-02 NOTE — ED Notes (Signed)
Pt pressed call bell to alert staff that IV pump was beeping. Upon arrival to the room, pt stated that her arm was swollen. Pt's arm noted to be extremely swollen around forearm and upper arm. Dr. Darl Householder was notified immediately and the IV pump was turned off.

## 2016-12-02 NOTE — ED Notes (Signed)
Heat packs placed on swollen areas.

## 2016-12-02 NOTE — ED Notes (Signed)
Pt unable to void at this time. 

## 2016-12-02 NOTE — Progress Notes (Signed)
Shirley [name] is a 41 y.o. female patient admitted from ED awake, alert - oriented  X 4 - no acute distress noted.  VSS - Blood pressure (!) 155/102, pulse 92, temperature 98.5 F (36.9 C), temperature source Oral, resp. rate 16, height 5\' 7"  (1.702 m), weight 95.3 kg (210 lb), last menstrual period 11/04/2016, SpO2 99 %.    IV in place, NS @ 75 hour, occlusive dsg intact without redness.  Orientation to room, and floor completed with information packet given to patient/family.  Patient declined safety video at this time.  Admission INP armband ID verified with patient/family, and in place.   SR up x 2, fall assessment complete, with patient and family able to verbalize understanding of risk associated with falls, and verbalized understanding to call nsg before up out of bed.  Call light within reach, patient able to voice, and demonstrate understanding.  Skin, clean-dry- intact without evidence of bruising, or skin tears.   No evidence of skin break down noted on exam.     Will cont to eval and treat per MD orders.  Milas Hock, RN 12/02/2016 6:43 PM

## 2016-12-02 NOTE — ED Triage Notes (Signed)
Pt presents with multiple complaints, CP, abd, N/V. Pt seen multiple times for same. Chronic pain.

## 2016-12-02 NOTE — ED Provider Notes (Signed)
[facility] DEPT Provider Note   CSN: [id number] Arrival date & time: 12/02/16  0259     History   Chief Complaint Chief Complaint  Patient presents with  . Multiple Complaints    HPI Shirley [name] is a 41 y.o. female hx of CAD, CVA, fibromyalgia, gastroparesis, recent PE on eliquis, here with abdominal pain, vomiting, shortness of breath. Patient states that she started vomiting since yesterday and has some right lower quadrant pain. Patient states that she has Phenergan ordered but she could not keep down her Phenergan. She also could not keep down her Eliquis as well. Of note, patient had extensive evaluation for her complaints recently. She had 2 CTs over the last several weeks that showed pulmonary embolus and she was admitted after the first one. She has been compliant with her a liquids daily and was not able to keep down her eliquis yesterday. Patient also had recurrent abdominal pain and had right upper quadrant ultrasound and CT abdomen pelvis that just showed large fibroid. Patient was seen also for vaginal bleeding that has been stable and denies worsening bleeding. Patient also has history of gastroparesis.  The history is provided by the patient.    Past Medical History:  Diagnosis Date  . Abscess of tunica vaginalis    10/09- Abundant S. aureus- sensitive to all abx  . Anxiety   . Blood dyscrasia   . CAD (coronary artery disease) 06/15/2006   s/p Subendocardial MI with PDA angioplasty(no stent) on 06/15/06 and relook  cath 06/19/06 showed patency of site. Cath 12/10- no restenosis or significant CAD progression  . CVA (cerebral vascular accident) ([place]) ~ 02/2014   denies residual on 04/22/2014  . CVA (cerebral vascular accident) [facility])    history of remote right cerebellar infarct noted on head CT at least since 10/2011  . Depression   . Diabetes mellitus type 2, uncontrolled, with complications ([place])   . Fibromyalgia   . Gastritis   . Gastroparesis    secondary  to poorly controlled DM, last emptying study performed 01/2010  was normal but may be falsely positive as pt was on reglan  . GERD (gastroesophageal reflux disease)   . Hepatitis B, chronic (HCC)    Hep BeAb+,Hep B cAb+ & Hep BsAg+ (9/06)  . History of pyelonephritis    H/o GrpB Pyelonephritis (9/06) and UTI- 07/11- E.Coli, 12/10- GBS  . Hyperlipidemia   . Hypertension   . Iron deficiency anemia   . Irregular menses    Small ovarian follicles seen on DQ(2/22)  . MI (myocardial infarction) ([place]) 05/2006   PDA percutaneous transluminal coronary angioplasty  . Migraine    "weekly" (04/22/2014)  . N&V (nausea and vomiting)    Chronic. Unclear etiology with multiple admission and ED visits. CT abdomen with and without contrast (02/2011)  showed no acute process. Gastic Emptying scan (01/2010) was normal. Ultrasound of the abdomen was within normal limits. Hepatitis B viral load was undectable. HIV NR. EGD - gastritis, Hpylori + s/p Rx  . Obesity   . OSA (obstructive sleep apnea)    "suppose to wear mask but I don't" (04/22/2014)  . Peripheral neuropathy   . Pneumonia    "this is probably the 2nd or 3rd time I've had pneumonia" (04/22/2014)  . Recurrent boils   . Seasonal asthma   . Substance abuse   . Thrombocytosis ([place])    Hem/Onc suggested 2/2 chronic hepatits and/or iron deficiency anemia    Patient Active Problem List  Diagnosis Date Noted  . Acute blood loss anemia 11/13/2016  . Menorrhagia 11/13/2016  . Bilateral pulmonary embolism ([place]) 10/30/2016  . Acute DVT (deep venous thrombosis) ([place]) 10/30/2016  . Type 2 diabetes mellitus with hyperglycemia ([place]) 04/07/2016  . Diabetic gastroparesis ([place]) 04/06/2016  . Dehydration 03/27/2015  . Gastroparesis 11/11/2014  . Hematemesis 10/13/2014  . DKA (diabetic ketoacidoses) ([place]) 10/13/2014  . Tachycardia 07/21/2014  . Abdominal pain, chronic, left lower quadrant   . Diabetic gastroparesis associated with type 2 diabetes  mellitus ([place]) 04/28/2014  . History of Helicobacter pylori infection 04/22/2014  . Vaginal discharge 02/18/2014  . Atypical chest pain 07/22/2013  . Unspecified constipation 07/21/2013  . Tinea corporis 07/21/2013  . Intractable vomiting 04/29/2013  . Dysmenorrhea 04/22/2013  . UTI (urinary tract infection) 07/15/2012  . Headache(784.0) 02/08/2012  . Health care maintenance 01/22/2012  . Chronic hepatitis B ([place]) 03/07/2011  . History of leukocytosis 04/06/2010  . THROMBOCYTOSIS 04/06/2010  . Polysubstance abuse 02/23/2010  . Iron deficiency anemia 11/22/2009  . PERIPHERAL NEUROPATHY 10/01/2009  . Hyperlipidemia 08/30/2009  . Diabetic polyneuropathy ([place]) 08/30/2009  . Hidradenitis (recurrent boils) 07/07/2008  . Depression 12/27/2007  . Abdominal pain, left lower quadrant 11/21/2007  . FIBROMYALGIA 10/30/2007  . BACK PAIN 04/01/2007  . OBSTRUCTIVE SLEEP APNEA 01/17/2007  . ANXIETY DEPRESSION 06/27/2006  . Chronic ischemic heart disease 06/15/2006  . OBESITY, MORBID 05/15/2006  . MIGRAINE HEADACHE 05/15/2006  . Asthma 05/15/2006  . Essential hypertension 01/16/2006  . IRREGULAR MENSTRUATION 01/16/2006  . PEDAL EDEMA 01/16/2006  . Poorly controlled type 2 diabetes mellitus with peripheral neuropathy ([place]) 01/16/1989    Past Surgical History:  Procedure Laterality Date  . CESAREAN SECTION  1997  . CORONARY ANGIOPLASTY WITH STENT PLACEMENT  2008   "2 stents"  . ESOPHAGOGASTRODUODENOSCOPY N/A 04/23/2014   Procedure: ESOPHAGOGASTRODUODENOSCOPY (EGD);  Surgeon: Winfield Cunas., MD;  [facility] ENDOSCOPY;  Service: Endoscopy;  Laterality: N/A;    OB History    Gravida Para Term Preterm AB Living   _0 SAB TAB Ectopic Multiple Live Births           2       Home Medications    Prior to Admission medications   Medication Sig Start Date End Date Taking? Authorizing Provider  apixaban (ELIQUIS) 5 MG TABS tablet Take 1 tablet (5 mg total) by mouth 2 (two)  times daily. 11/18/16  Yes Rice, Resa Miner, MD  clindamycin (CLINDAGEL) 1 % gel Apply topically 2 (two) times daily. 09/27/16  Yes Clent Demark, PA-C  ferrous sulfate 325 (65 FE) MG tablet Take 1 tablet (325 mg total) by mouth 2 (two) times daily with a meal. 09/05/16  Yes Clent Demark, PA-C  glucose blood (FREESTYLE TEST STRIPS) test strip Use as instructed 09/05/16  Yes Clent Demark, PA-C  glucose monitoring kit (FREESTYLE) monitoring kit 1 each by Does not apply route 4 (four) times daily - after meals and at bedtime. 1 month Diabetic Testing Supplies for QAC-QHS accuchecks. 09/05/16  Yes Clent Demark, PA-C  hydrochlorothiazide (HYDRODIURIL) 25 MG tablet Take 1 tablet (25 mg total) by mouth daily. Take on tablet in the morning. Patient taking differently: Take 25 mg by mouth daily.  09/27/16  Yes Clent Demark, PA-C  HYDROcodone-acetaminophen (NORCO/VICODIN) 5-325 MG tablet Take 1 tablet by mouth every 4 (four) hours as needed. 11/29/16  Yes Charlann Lange, PA-C  insulin NPH-regular Human (  NOVOLIN 70/30) (70-30) 100 UNIT/ML injection 25 units in the morning and 25 units in the evening. Patient taking differently: Inject 25 Units into the skin 2 (two) times daily with a meal.  09/27/16  Yes Clent Demark, PA-C  losartan (COZAAR) 50 MG tablet Take 1 tablet (50 mg total) by mouth daily. 09/27/16  Yes Clent Demark, PA-C  lovastatin (MEVACOR) 20 MG tablet Take 1 tablet (20 mg total) by mouth at bedtime. 09/27/16  Yes Clent Demark, PA-C  megestrol (MEGACE) 40 MG tablet Take 2 tablets (80 mg total) by mouth 2 (two) times daily. 11/20/16 12/20/16 Yes Lavonia Drafts, MD  metoCLOPramide (REGLAN) 10 MG tablet Take 1 tablet (10 mg total) by mouth 4 (four) times daily -  before meals and at bedtime. 09/27/16  Yes Clent Demark, PA-C  metoprolol tartrate (LOPRESSOR) 50 MG tablet Take 1 tablet (50 mg total) by mouth 2 (two) times daily. 09/27/16  Yes Clent Demark,  PA-C  omeprazole (PRILOSEC) 40 MG capsule Take 1 capsule (40 mg total) by mouth daily. 09/27/16  Yes Clent Demark, PA-C  ondansetron [facility] ODT) 8 MG disintegrating tablet Take 1 tablet (8 mg total) by mouth every 8 (eight) hours as needed for nausea or vomiting. 11/05/16  Yes [name], MD  oxyCODONE-acetaminophen (PERCOCET/ROXICET) 5-325 MG tablet Take 1 tablet by mouth every 4 (four) hours as needed for severe pain. 11/29/16  Yes Charlann Lange, PA-C  promethazine (PHENERGAN) 25 MG tablet Take 1 tablet (25 mg total) by mouth every 8 (eight) hours as needed for nausea or vomiting. 11/18/16 12/02/16 Yes Rice, Resa Miner, MD  albuterol (PROVENTIL HFA;VENTOLIN HFA) 108 (90 Base) MCG/ACT inhaler Inhale 2 puffs into the lungs every 4 (four) hours as needed for wheezing or shortness of breath. Patient not taking: Reported on 11/20/2016 09/27/16   Clent Demark, PA-C  aspirin EC 81 MG tablet Take 1 tablet (81 mg total) by mouth daily. Patient not taking: Reported on 11/11/2016 09/27/16   Clent Demark, PA-C  INSULIN SYRINGE .5CC/28G (INS SYRINGE/NEEDLE .5CC/28G) 28G X 1/2" 0.5 ML MISC Please provide 1 month supply 09/27/16   Clent Demark, PA-C  Lancets (FREESTYLE) lancets Use as instructed 09/27/16   Clent Demark, PA-C  traMADol (ULTRAM) 50 MG tablet Take 1 tablet (50 mg total) by mouth every 6 (six) hours as needed. Patient not taking: Reported on 11/28/2016 11/25/16   Arnetha Massy, MD    Family History Family History  Problem Relation Age of Onset  . Diabetes Father     Social History Social History  Substance Use Topics  . Smoking status: Former Smoker    Types: Cigarettes    Quit date: 04/24/1996  . Smokeless tobacco: Never Used     Comment: quit smoking cigarettes age 71  . Alcohol use No     Comment: 04/22/2014 "might have a few drinks a month"     Allergies   Lisinopril and Morphine and related   Review of Systems Review of Systems  Respiratory: Positive for  shortness of breath.   Gastrointestinal: Positive for abdominal pain.  All other systems reviewed and are negative.    Physical Exam Updated Vital Signs BP (!) 151/94   Pulse (!) 114   Temp 98.5 F (36.9 C) (Oral)   Resp (!) 25   Ht _0  (1.702 m)   Wt 95.3 kg (210 lb)   LMP 11/04/2016   SpO2 94%   BMI 32.89 kg/m  Physical Exam  Constitutional: She is oriented to person, place, and time.  Uncomfortable, tachycardic   HENT:  Head: Normocephalic.  MM slightly dry   Eyes: Pupils are equal, round, and reactive to light. Conjunctivae and EOM are normal.  Neck: Normal range of motion. Neck supple.  Cardiovascular: Regular rhythm and normal heart sounds.   Tachycardic   Pulmonary/Chest: Effort normal and breath sounds normal. No respiratory distress. She has no wheezes.  Abdominal: Soft. Bowel sounds are normal.  Mild RLQ tenderness, no rebound   Musculoskeletal: Normal range of motion. She exhibits no edema.  Neurological: She is alert and oriented to person, place, and time. No cranial nerve deficit. Coordination normal.  Skin: Skin is warm.  Psychiatric: She has a normal mood and affect.  Nursing note and vitals reviewed.    ED Treatments / Results  Labs (all labs ordered are listed, but only abnormal results are displayed) Labs Reviewed  LIPASE, BLOOD - Abnormal; Notable for the following:       Result Value   Lipase 55 (*)    All other components within normal limits  COMPREHENSIVE METABOLIC PANEL - Abnormal; Notable for the following:    Glucose, Bld 370 (*)    Creatinine, Ser 1.14 (*)    Albumin 3.4 (*)    ALT 9 (*)    GFR calc non Af Amer 58 (*)    All other components within normal limits  CBC - Abnormal; Notable for the following:    RBC 3.85 (*)    Hemoglobin 10.0 (*)    HCT 29.6 (*)    MCV 76.9 (*)    RDW 21.7 (*)    Platelets 494 (*)    All other components within normal limits  URINALYSIS, ROUTINE W REFLEX MICROSCOPIC  I-STAT TROPONIN, ED     EKG  EKG Interpretation  Date/Time:  Saturday December 02 2016 03:02:07 EDT Ventricular Rate:  117 PR Interval:  122 QRS Duration: 74 QT Interval:  320 QTC Calculation: 446 R Axis:   74 Text Interpretation:  Sinus tachycardia Cannot rule out Anterior infarct , age undetermined Abnormal ECG No significant change since last tracing Confirmed by Wandra Arthurs [phone]) on 12/02/2016 6:57:11 AM       Radiology No results found.  Procedures Procedures (including critical care time)  Angiocath insertion Performed by: Wandra Arthurs  Consent: Verbal consent obtained. Risks and benefits: risks, benefits and alternatives were discussed Time out: Immediately prior to procedure a "time out" was called to verify the correct patient, procedure, equipment, support staff and site/side marked as required.  Preparation: Patient was prepped and draped in the usual sterile fashion.  Vein Location: R basilic  Ultrasound Guided  Gauge: 20 long   Normal blood return and flush without difficulty Patient tolerance: Patient tolerated the procedure well with no immediate complications.  Angiocath insertion Performed by: Wandra Arthurs  Consent: Verbal consent obtained. Risks and benefits: risks, benefits and alternatives were discussed Time out: Immediately prior to procedure a "time out" was called to verify the correct patient, procedure, equipment, support staff and site/side marked as required.  Preparation: Patient was prepped and draped in the usual sterile fashion.  Vein Location:  L antecube   Ultrasound Guided  Gauge: 20 long   Normal blood return and flush without difficulty Patient tolerance: Patient tolerated the procedure well with no immediate complications.      Medications Ordered in ED Medications  sodium chloride 0.9 % bolus 1,000 mL (1,000  mLs Intravenous New Bag/Given 12/02/16 0748)  promethazine (PHENERGAN) injection 25 mg (25 mg Intravenous Given 12/02/16 0748)   HYDROmorphone (DILAUDID) injection 1 mg (1 mg Intravenous Given 12/02/16 0748)     Initial Impression / Assessment and Plan / ED Course  I have reviewed the triage vital signs and the nursing notes.  Pertinent labs & imaging results that were available during my care of the patient were reviewed by me and considered in my medical decision making (see chart for details).    Shirley [name] is a 41 y.o. female here with vomiting, SOB, tachycardia, ab pain. Patient had extensive workup over the last several weeks including 2 CT angio chest and 1 CT ab/pel and RUQ [place]. Most recent CT angio chest showed improved PE as expected. She is tachycardic but I doubt failure of eliquis. She just had CT ab/pel that showed no appendicitis, just fibroids. I have low suspicion for appendicitis. I think likely recurrent gastroparesis vs pain from fibroids, less likely torsion. Will repeat labs, acute abdominal series, UA, Pelvic US. Will give pain meds, IVF, and reassess.   2:25 PM Hg stable. WBC nl. Lipase 55. US showed fibroid. Acute abdominal series unremarkable. Unfortunately R basilic IV infiltrated. I placed warm compresses. L antecube IV placed under ultrasound. Still in pain, unable to tolerate PO. Will admit for intractable vomiting, dehydration under internal medicine teaching service    Final Clinical Impressions(s) / ED Diagnoses   Final diagnoses:  Vaginal bleeding    New Prescriptions New Prescriptions   No medications on file     Drenda Freeze, MD 12/02/16 1427

## 2016-12-02 NOTE — ED Notes (Signed)
Patient returned to room from xray and ultrasound.

## 2016-12-02 NOTE — ED Notes (Signed)
Cup of water provided to patient for fluid challenge.  

## 2016-12-02 NOTE — ED Notes (Signed)
Patient currently still in Ultrasound.

## 2016-12-02 NOTE — H&P (Signed)
Date: 12/02/2016               Patient Name:  Shirley [name] MRN: [id number]  DOB: Nov 05, 1975 Age / Sex: 41 y.o., female   PCP: Clent Demark, PA-C         Medical Service: Internal Medicine Teaching Service         Attending Physician: Dr. Darl Householder, Lujean Rave, MD    First Contact: Dr. Johny Chess Pager: [phone]  Second Contact: Dr. Juleen China Pager: [phone]       After Hours (After 5p/  First Contact Pager: [phone]  weekends / holidays): Second Contact Pager: [phone]   Chief Complaint: Abdominal pain   History of Present Illness: Shirley [name] is a 41 yo F with history of DM with gastroparesis, PE currently on Eliquis, uterine fibroids, anemia, fibromyalgia who presented to the ED with complaints of abdominal pain, nausea, and vaginal bleeding.  Her abdominal pain started yesterday afternoon around 3 o'clock while sitting down. Her pain is in both the lower and upper abdomen extending into the chest. She has experienced associated nausea but denies vomiting (inconsistent with hx provided to other provider) or diarrhea. Her last BM was two days ago, states her normal is once per week d/t gastroparesis. She tried tramadol and phenergan without relief of her sx. She has been able to eat lunch and dinner yesterday, though she missed a dose of Eliquis due to nausea yesterday. She has been seen in the ED numerous times for similar complaints, most recently on 8/4 and 8/7. In the past month, she had a normal CT Abd/P with contrast (8/8), normal RUQ [place] 8/7, normal CT Stone studies x2, and CTA chest x2 showing stable PE burden. She states her current pain and nausea are similar to prior gastroparesis flares. Her PCP recently recommended gastroparesis management with phenergan, erythromycin, reglan on 6/6 clinic visit, pt is currently taking phenergan at home.  She also complains of constant vaginal bleeding since June attributed to known uterine fibroid. Denies vaginal pain, discharge. The bleeding  improved with a dose of Depo in early June but has occurred daily around the time she started anti-coagulation for PE in early July. During a recent admission she was started on Megace 80 mg BID. She was seen by Ob/Gyn on 7/30 when she reported improvement with mild spotting. However, today she reports there has been no subjective change with Megace and she has been going through 8 pad per day since starting anti-coagulation.          In the ED, vitals HR 94, 147/84, 96% on RA. Normal KUB, further abdominal imaging was not pursued given recent results. Pelvic and transvaginal US revealed known fundal fibroid, no torsion. CXR normal. Hgb 10, MCV 76.9, electrolytes wnl, Cr 1.14, EKG sinus tachycardia, trop negative. She was given IV dilaudid, zofran, phenergan and fluids and admitted for further management of her pain and nausea.      Meds:  Current Meds  Medication Sig  . apixaban (ELIQUIS) 5 MG TABS tablet Take 1 tablet (5 mg total) by mouth 2 (two) times daily.  . clindamycin (CLINDAGEL) 1 % gel Apply topically 2 (two) times daily.  . ferrous sulfate 325 (65 FE) MG tablet Take 1 tablet (325 mg total) by mouth 2 (two) times daily with a meal.  . glucose blood (FREESTYLE TEST STRIPS) test strip Use as instructed  . glucose monitoring kit (FREESTYLE) monitoring kit 1 each by Does not apply route 4 (four)  times daily - after meals and at bedtime. 1 month Diabetic Testing Supplies for QAC-QHS accuchecks.  . hydrochlorothiazide (HYDRODIURIL) 25 MG tablet Take 1 tablet (25 mg total) by mouth daily. Take on tablet in the morning. (Patient taking differently: Take 25 mg by mouth daily. )  . HYDROcodone-acetaminophen (NORCO/VICODIN) 5-325 MG tablet Take 1 tablet by mouth every 4 (four) hours as needed.  . insulin NPH-regular Human (NOVOLIN 70/30) (70-30) 100 UNIT/ML injection 25 units in the morning and 25 units in the evening. (Patient taking differently: Inject 25 Units into the skin 2 (two) times daily with  a meal. )  . losartan (COZAAR) 50 MG tablet Take 1 tablet (50 mg total) by mouth daily.  Marland Kitchen lovastatin (MEVACOR) 20 MG tablet Take 1 tablet (20 mg total) by mouth at bedtime.  . megestrol (MEGACE) 40 MG tablet Take 2 tablets (80 mg total) by mouth 2 (two) times daily.  . metoCLOPramide (REGLAN) 10 MG tablet Take 1 tablet (10 mg total) by mouth 4 (four) times daily -  before meals and at bedtime.  . metoprolol tartrate (LOPRESSOR) 50 MG tablet Take 1 tablet (50 mg total) by mouth 2 (two) times daily.  Marland Kitchen omeprazole (PRILOSEC) 40 MG capsule Take 1 capsule (40 mg total) by mouth daily.  . ondansetron (ZOFRAN ODT) 8 MG disintegrating tablet Take 1 tablet (8 mg total) by mouth every 8 (eight) hours as needed for nausea or vomiting.  Marland Kitchen oxyCODONE-acetaminophen (PERCOCET/ROXICET) 5-325 MG tablet Take 1 tablet by mouth every 4 (four) hours as needed for severe pain.  . promethazine (PHENERGAN) 25 MG tablet Take 1 tablet (25 mg total) by mouth every 8 (eight) hours as needed for nausea or vomiting.     Allergies: Allergies as of 12/02/2016 - Review Complete 12/02/2016  Allergen Reaction Noted  . Lisinopril Nausea Only and Swelling 03/17/2008  . Morphine and related Itching and Swelling 04/15/2013   Past Medical History:  Diagnosis Date  . Abscess of tunica vaginalis    10/09- Abundant S. aureus- sensitive to all abx  . Anxiety   . Blood dyscrasia   . CAD (coronary artery disease) 06/15/2006   s/p Subendocardial MI with PDA angioplasty(no stent) on 06/15/06 and relook  cath 06/19/06 showed patency of site. Cath 12/10- no restenosis or significant CAD progression  . CVA (cerebral vascular accident) ([place]) ~ 02/2014   denies residual on 04/22/2014  . CVA (cerebral vascular accident) [facility])    history of remote right cerebellar infarct noted on head CT at least since 10/2011  . Depression   . Diabetes mellitus type 2, uncontrolled, with complications ([place])   . Fibromyalgia   . Gastritis   .  Gastroparesis    secondary to poorly controlled DM, last emptying study performed 01/2010  was normal but may be falsely positive as pt was on reglan  . GERD (gastroesophageal reflux disease)   . Hepatitis B, chronic (HCC)    Hep BeAb+,Hep B cAb+ & Hep BsAg+ (9/06)  . History of pyelonephritis    H/o GrpB Pyelonephritis (9/06) and UTI- 07/11- E.Coli, 12/10- GBS  . Hyperlipidemia   . Hypertension   . Iron deficiency anemia   . Irregular menses    Small ovarian follicles seen on OX(7/35)  . MI (myocardial infarction) ([place]) 05/2006   PDA percutaneous transluminal coronary angioplasty  . Migraine    "weekly" (04/22/2014)  . N&V (nausea and vomiting)    Chronic. Unclear etiology with multiple admission and ED visits. CT abdomen with  and without contrast (02/2011)  showed no acute process. Gastic Emptying scan (01/2010) was normal. Ultrasound of the abdomen was within normal limits. Hepatitis B viral load was undectable. HIV NR. EGD - gastritis, Hpylori + s/p Rx  . Obesity   . OSA (obstructive sleep apnea)    "suppose to wear mask but I don't" (04/22/2014)  . Peripheral neuropathy   . Pneumonia    "this is probably the 2nd or 3rd time I've had pneumonia" (04/22/2014)  . Recurrent boils   . Seasonal asthma   . Substance abuse   . Thrombocytosis (HCC)    Hem/Onc suggested 2/2 chronic hepatits and/or iron deficiency anemia    Family History:  Family History  Problem Relation Age of Onset  . Diabetes Father      Social History:  Social History  Substance Use Topics  . Smoking status: Former Smoker    Types: Cigarettes    Quit date: 04/24/1996  . Smokeless tobacco: Never Used     Comment: quit smoking cigarettes age 69  . Alcohol use No     Comment: 04/22/2014 "might have a few drinks a month"     Review of Systems: A complete ROS was negative except as per HPI.   Physical Exam: Blood pressure (!) 155/102, pulse 92, temperature 98.5 F (36.9 C), temperature source Oral,  resp. rate 16, height 5' 7"  (1.702 m), weight 210 lb (95.3 kg), last menstrual period 11/04/2016, SpO2 99 %. Physical Exam  Constitutional: She is oriented to person, place, and time. She appears well-developed and well-nourished. No distress.  HENT:  Head: Normocephalic and atraumatic.  Mouth/Throat: No oropharyngeal exudate.  Slightly dry mucous membranes   Eyes:  Pinpoint pupils (pain med recently given)  Neck: Neck supple.  Cardiovascular: Normal rate, regular rhythm and normal heart sounds.   Pulmonary/Chest: Effort normal and breath sounds normal. No respiratory distress. She has no wheezes.  Abdominal: Soft. Bowel sounds are normal. She exhibits no distension.  Obese abdomen, Slight tenderness to palpation throughout, no rebound or guarding, negative Murphy's sign   Neurological: She is alert and oriented to person, place, and time.  Skin: Skin is warm and dry. Capillary refill takes less than 2 seconds.     EKG: personally reviewed my interpretation is sinus tachycardia.   CXR: personally reviewed my interpretation is normal chest XR with no acute abnormality.   Assessment & Plan by Problem: Abdominal pain, nausea likely d/t Diabetic Gastoparesis  History of gastroparesis which is felt to be most likely etiology of her sx with recent workup negative for other acute processes. Pt also reports being able to maintain some amount of PO intake, labs unremarkable with no electrolyte abnormalities, Cr around baseline. Also stating she would like to try to eat on admission.  --Anti-emetics with IV Phenergan q6hr prn, PO Reglan TID, Zofran q6hr prn --Pain control with PO Tramadol q6hr prn  --Continue home PPI --Bowel regimen- miralax, senna --Clear Liquid diet, advance as tolerated  --Maintenance IVF --BMP      Vaginal bleeding, known uterine fundal fibroid Iron deficiency anemia  Known history of uterine fibroids with associated irregular uterine bleeding, imaged again today which  may be another contributor to her abdominal pain. Ongoing vaginal bleeding since being on anti-coagulation for Pes though conflicting information regarding severity and response to megestrol. Hgb today 10.0, improved from prior suggesting bleeding has improved. Pt also no longer taking oral Fe, received IV Fe on recent admission.  --Continue Megace 80 mg BID --Restart  oral Fe --CBC  History of Bilateral PE, L LE DVT  Stable on recent imaging, on home Eliquis after switching from Xarelto.  --Continue Eliquis 5 mg BID   Diabetes Mellitus, insulin dependent On home insulin 70/30 25 U BID, most recent A1c 8.6 in 08/2016. --Novolog 70/30 15 U BID, SSI    Dispo: Admit patient to Observation with expected length of stay less than 2 midnights.  Signed: Tawny Asal, MD 12/02/2016, 2:20 PM  Pager: [phone]

## 2016-12-02 NOTE — ED Notes (Signed)
Patient asleep when RN entered room. Awakened by RN. When asked what number her pain level is between 0-10, patient states "10". Patient asleep, snoring, while RN administered Zofran and Dilaudid. No distress noted.

## 2016-12-03 DIAGNOSIS — N921 Excessive and frequent menstruation with irregular cycle: Secondary | ICD-10-CM

## 2016-12-03 DIAGNOSIS — E1165 Type 2 diabetes mellitus with hyperglycemia: Secondary | ICD-10-CM

## 2016-12-03 DIAGNOSIS — E1142 Type 2 diabetes mellitus with diabetic polyneuropathy: Secondary | ICD-10-CM

## 2016-12-03 DIAGNOSIS — I10 Essential (primary) hypertension: Secondary | ICD-10-CM

## 2016-12-03 DIAGNOSIS — E785 Hyperlipidemia, unspecified: Secondary | ICD-10-CM

## 2016-12-03 LAB — GLUCOSE, CAPILLARY
GLUCOSE-CAPILLARY: 240 mg/dL — AB (ref 65–99)
Glucose-Capillary: 165 mg/dL — ABNORMAL HIGH (ref 65–99)
Glucose-Capillary: 243 mg/dL — ABNORMAL HIGH (ref 65–99)
Glucose-Capillary: 209 mg/dL — ABNORMAL HIGH (ref 65–99)

## 2016-12-03 LAB — BASIC METABOLIC PANEL
ANION GAP: 7 (ref 5–15)
BUN: 7 mg/dL (ref 6–20)
CALCIUM: 8.4 mg/dL — AB (ref 8.9–10.3)
CO2: 21 mmol/L — ABNORMAL LOW (ref 22–32)
Chloride: 108 mmol/L (ref 101–111)
Creatinine, Ser: 0.89 mg/dL (ref 0.44–1.00)
GFR calc Af Amer: >60 mL/min (ref >60)
Glucose, Bld: 149 mg/dL — ABNORMAL HIGH (ref 65–99)
POTASSIUM: 3.5 mmol/L (ref 3.5–5.1)
SODIUM: 136 mmol/L (ref 135–145)

## 2016-12-03 LAB — CBC
HEMATOCRIT: 26.3 % — AB (ref 36.0–46.0)
Hemoglobin: 9 g/dL — ABNORMAL LOW (ref 12.0–15.0)
MCH: 26.1 pg (ref 26.0–34.0)
MCHC: 34.2 g/dL (ref 30.0–36.0)
MCV: 76.2 fL — ABNORMAL LOW (ref 78.0–100.0)
Platelets: 434 10*3/uL — ABNORMAL HIGH (ref 150–400)
RBC: 3.45 MIL/uL — ABNORMAL LOW (ref 3.87–5.11)
RDW: 21.2 % — AB (ref 11.5–15.5)
WBC: 9.6 10*3/uL (ref 4.0–10.5)

## 2016-12-03 LAB — ANTITHROMBIN III: ANTITHROMB III FUNC: 97 % (ref 75–120)

## 2016-12-03 NOTE — Progress Notes (Signed)
   Subjective: No acute events overnight, pt reports intermittent nausea and constant abdominal pain only slightly relieved with tramadol. She tolerated broth for dinner last night, no emesis. Required one zofran dose overnight. She reports she is very hungry this morning and would like to have a regular diet. Also reports no change to vaginal bleeding this morning.     Objective:  Vital signs in last 24 hours: Vitals:   12/02/16 2127 12/02/16 2300 12/03/16 0529 12/03/16 1033  BP: (!) 180/124 (!) 150/88 (!) 143/78 (!) 154/88  Pulse: 96  86   Resp:      Temp: 99.2 F (37.3 C)  98.7 F (37.1 C)   TempSrc: Oral  Oral   SpO2: 100%  100%   Weight:      Height:       Physical Exam  Constitutional: She is oriented to person, place, and time.  Obese female resting in bed in no acute distress   HENT:  Head: Normocephalic and atraumatic.  Cardiovascular: Normal rate, regular rhythm and normal heart sounds.   Pulmonary/Chest: Effort normal and breath sounds normal. No respiratory distress.  Abdominal: Soft. Bowel sounds are normal. She exhibits no distension. There is no rebound and no guarding.  Mild tenderness to palpation predominately on R side of abdomen   Neurological: She is alert and oriented to person, place, and time.  Skin: Skin is warm and dry.     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 No significant electrolyte abnormalities. She has tolerated PO intake at home and able to quickly advance her diet.    --Anti-emetics- IV Phenergan q6hr prn, PO Reglan TID, Zofran q6hr prn --Pain control- PO Tramadol q6hr prn, will try to avoid further narcotics that would decrease gut motility   --Continue home PPI --Bowel regimen- miralax, senna --Advance diet  --BMP daily     Vaginal bleeding, known uterine fundal  fibroid Iron deficiency anemia  Known history of uterine fibroids with associated irregular uterine bleeding, imaged again today which may be another contributor to her abdominal pain. Ongoing vaginal bleeding since being on anti-coagulation for PE though conflicting information regarding severity and response to megestrol. Hgb10.0 on presentation, improved from prior suggesting bleeding has improved. Pt also no longer taking oral Fe, received IV Fe on recent admission. Current Hgb 9.0  --Continue Megace 80 mg BID --Continue oral Fe --CBC daily   History of Bilateral PE, L LE DVT  Stable on recent imaging, on home Eliquis after switching from Xarelto. On further review it appears her DVT/PE was considered unprovoked but did have some small increased risk with Depo-provera. Will assess for hypercoagulable state such as APS with labs unaffected by anti-coagulation.   --Continue Eliquis 5 mg BID --Antiphospholipid Ab labs, AT3, Factor V Leiden, Prothrombin gene mutation     Diabetes Mellitus, insulin dependent On home insulin 70/30 25 U BID, most recent A1c 8.6 in 08/2016. --Novolog 70/30 15 U BID, SSI    Dispo: Anticipated discharge in approximately 1-2 day(s).   Tawny Asal, MD 12/03/2016, 10:42 AM Pager: [phone]

## 2016-12-04 LAB — BASIC METABOLIC PANEL
Anion gap: 5 (ref 5–15)
BUN: 8 mg/dL (ref 6–20)
CALCIUM: 8.4 mg/dL — AB (ref 8.9–10.3)
CO2: 23 mmol/L (ref 22–32)
Chloride: 108 mmol/L (ref 101–111)
Creatinine, Ser: 1.16 mg/dL — ABNORMAL HIGH (ref 0.44–1.00)
GFR calc Af Amer: >60 mL/min (ref >60)
GFR, EST NON AFRICAN AMERICAN: 57 mL/min — AB (ref >60)
Glucose, Bld: 304 mg/dL — ABNORMAL HIGH (ref 65–99)
POTASSIUM: 3.6 mmol/L (ref 3.5–5.1)
SODIUM: 136 mmol/L (ref 135–145)

## 2016-12-04 LAB — GLUCOSE, CAPILLARY
GLUCOSE-CAPILLARY: 315 mg/dL — AB (ref 65–99)
GLUCOSE-CAPILLARY: 258 mg/dL — AB (ref 65–99)
GLUCOSE-CAPILLARY: 357 mg/dL — AB (ref 65–99)
GLUCOSE-CAPILLARY: 320 mg/dL — AB (ref 65–99)

## 2016-12-04 LAB — CBC
HEMATOCRIT: 26.4 % — AB (ref 36.0–46.0)
Hemoglobin: 8.9 g/dL — ABNORMAL LOW (ref 12.0–15.0)
MCH: 25.7 pg — ABNORMAL LOW (ref 26.0–34.0)
MCHC: 33.7 g/dL (ref 30.0–36.0)
MCV: 76.3 fL — ABNORMAL LOW (ref 78.0–100.0)
PLATELETS: 412 10*3/uL — AB (ref 150–400)
RBC: 3.46 MIL/uL — ABNORMAL LOW (ref 3.87–5.11)
RDW: 20.5 % — AB (ref 11.5–15.5)
WBC: 8 10*3/uL (ref 4.0–10.5)

## 2016-12-04 MED ORDER — ACETAMINOPHEN 325 MG PO TABS
650.0000 mg | ORAL_TABLET | Freq: Four times a day (QID) | ORAL | Status: DC
  Administered 2016-12-04 – 2016-12-05 (×5): 650 mg via ORAL
  Filled 2016-12-04 (×5): qty 2

## 2016-12-04 MED ORDER — INSULIN ASPART PROT & ASPART (70-30 MIX) 100 UNIT/ML ~~LOC~~ SUSP
25.0000 [IU] | Freq: Two times a day (BID) | SUBCUTANEOUS | Status: DC
  Administered 2016-12-04 – 2016-12-11 (×14): 25 [IU] via SUBCUTANEOUS
  Filled 2016-12-04: qty 10

## 2016-12-04 MED ORDER — ACETAMINOPHEN 650 MG RE SUPP: 650.0000 mg | Freq: Four times a day (QID) | RECTAL | Status: DC

## 2016-12-04 NOTE — Progress Notes (Signed)
Subjective: No acute events overnight, pt's nausea was relieved with prn phenergan doses overnight, also received zofran. She tolerated PO intake well, had [place] steak and rice last night. She had a small BM this morning. Her abdominal pain is in her lower abdomen today and may be primarily fibroid related at this point. Continues to have vaginal bleeding with clots, reports changing pad 5 times yesterday.       Objective:  Vital signs in last 24 hours: Vitals:   12/03/16 1033 12/03/16 1411 12/03/16 2219 12/04/16 0639  BP: (!) 154/88 126/71 139/85 (!) 156/90  Pulse:  84 98 79  Resp:  20 17 17   Temp:  98.9 F (37.2 C) 98.9 F (37.2 C) 98.8 F (37.1 C)  TempSrc:  Oral Oral Oral  SpO2:  100% 100% 100%  Weight:      Height:       Physical Exam  Constitutional: She is oriented to person, place, and time.  Obese female resting in bed in no acute distress   HENT:  Head: Normocephalic and atraumatic.  Cardiovascular: Normal rate, regular rhythm and normal heart sounds.   Pulmonary/Chest: Effort normal and breath sounds normal. No respiratory distress.  Abdominal: Soft. Bowel sounds are normal. She exhibits no distension. There is no rebound and no guarding.  Mild tenderness to palpation predominately of the lower abdomen    Neurological: She is alert and oriented to person, place, and time.  Skin: Skin is warm and dry.     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 No significant electrolyte abnormalities. She has tolerated PO intake at home and was able to quickly advance her diet. Her abdominal pain is now reported as lower and may now be related to uterine fibroid.   --Anti-emetics- IV Phenergan q6hr prn, PO Reglan TID, Zofran q6hr prn --Pain control- PO Tramadol q6hr prn, scheduled Tylenol, will try to avoid further  narcotics that would decrease gut motility   --Continue home PPI --Bowel regimen- miralax, senna --Advance diet  --BMP daily     Vaginal bleeding, known uterine fundal fibroid Iron deficiency anemia  Known history of uterine fibroid with associated irregular uterine bleeding, imaged with pelvic, transvaginal US on admission. Ongoing vaginal bleeding since being on anti-coagulation for PE though conflicting information regarding severity and response to megestrol. Hgb 10.0 on presentation, improved from prior suggesting bleeding has improved. Pt was also not taking oral Fe as outpatient, received IV Fe on recent admission. Her need for anti-coagulation complicates fibroid management, not an ideal surgical candidate given VTE. Could consider whether this pt would be appropriate for embolization of fibroid to alleviate bleeding. Current Hgb 8.9. --Pelvic MR w and wo contrast for further characterization --Continue Megace 80 mg BID --Continue oral Fe --CBC daily   History of Bilateral PE, L LE DVT  Stable on recent imaging, on home Eliquis after switching from Xarelto. On further review it appears her DVT/PE was considered unprovoked but did have some small increased risk with Depo-provera. Normal AT3.   --Continue Eliquis 5 mg BID --Antiphospholipid Ab labs, Factor V Leiden, Prothrombin gene mutation labs pending     Diabetes Mellitus, insulin dependent On home insulin 70/30 25 U BID, most recent A1c 8.6 in 08/2016. --Novolog 70/30 15 U BID, SSI    Dispo: Anticipated discharge in approximately 1-2 day(s).   Tawny Asal, MD  12/04/2016, 8:24 AM Pager: [phone]

## 2016-12-04 NOTE — Progress Notes (Signed)
Inpatient Diabetes Program Recommendations  AACE/ADA: New Consensus Statement on Inpatient Glycemic Control (2015)  Target Ranges:  Prepandial:   less than 140 mg/dL      Peak postprandial:   less than 180 mg/dL (1-2 hours)      Critically ill patients:  140 - 180 mg/dL   Lab Results  Component Value Date   GLUCAP 315 (H) 12/04/2016   HGBA1C 8.6 09/05/2016    Review of Glycemic Control Results for Shirley [name], Shirley [name] (MRN [id number]) as of 12/04/2016 12:02  Ref. Range 12/03/2016 08:21 12/03/2016 12:04 12/03/2016 16:56 12/03/2016 22:17 12/04/2016 08:20  Glucose-Capillary Latest Ref Range: 65 - 99 mg/dL 165 (H) 240 (H) 243 (H) 209 (H) 315 (H)   Diabetes history:DM2 Outpatient Diabetes medications: 70/30 25 units BID Current orders for Inpatient glycemic control: 70/30 15 units BID + Novolog correction 0-9 units tid  Inpatient Diabetes Program Recommendations: Noted patient elevated CBGs.  -Increase 70/30 to home dose 25 units bid -Change diet to carb modified  Thank you, Bethena Roys E. Shanira Tine, RN, MSN, CDE  Diabetes Coordinator Inpatient Glycemic Control Team Team Pager [phone] (8am-5pm) 12/04/2016 12:03 PM

## 2016-12-05 ENCOUNTER — Inpatient Hospital Stay (HOSPITAL_COMMUNITY): Payer: Self-pay

## 2016-12-05 DIAGNOSIS — D509 Iron deficiency anemia, unspecified: Secondary | ICD-10-CM

## 2016-12-05 DIAGNOSIS — Z794 Long term (current) use of insulin: Secondary | ICD-10-CM

## 2016-12-05 DIAGNOSIS — Z7901 Long term (current) use of anticoagulants: Secondary | ICD-10-CM

## 2016-12-05 DIAGNOSIS — R109 Unspecified abdominal pain: Secondary | ICD-10-CM

## 2016-12-05 DIAGNOSIS — I82502 Chronic embolism and thrombosis of unspecified deep veins of left lower extremity: Secondary | ICD-10-CM

## 2016-12-05 DIAGNOSIS — I2699 Other pulmonary embolism without acute cor pulmonale: Secondary | ICD-10-CM

## 2016-12-05 LAB — GLUCOSE, CAPILLARY
GLUCOSE-CAPILLARY: 222 mg/dL — AB (ref 65–99)
GLUCOSE-CAPILLARY: 82 mg/dL (ref 65–99)
Glucose-Capillary: 208 mg/dL — ABNORMAL HIGH (ref 65–99)
Glucose-Capillary: 178 mg/dL — ABNORMAL HIGH (ref 65–99)
Glucose-Capillary: 213 mg/dL — ABNORMAL HIGH (ref 65–99)

## 2016-12-05 LAB — CBC
HEMATOCRIT: 27.5 % — AB (ref 36.0–46.0)
Hemoglobin: 9.2 g/dL — ABNORMAL LOW (ref 12.0–15.0)
MCH: 25.7 pg — ABNORMAL LOW (ref 26.0–34.0)
MCHC: 33.5 g/dL (ref 30.0–36.0)
MCV: 76.8 fL — ABNORMAL LOW (ref 78.0–100.0)
PLATELETS: 405 10*3/uL — AB (ref 150–400)
RBC: 3.58 MIL/uL — ABNORMAL LOW (ref 3.87–5.11)
RDW: 20.2 % — AB (ref 11.5–15.5)
WBC: 8 10*3/uL (ref 4.0–10.5)

## 2016-12-05 LAB — CARDIOLIPIN ANTIBODIES, IGG, IGM, IGA: Anticardiolipin IgA: <9 APL U/mL (ref 0–11)

## 2016-12-05 LAB — APTT: APTT: 26 s (ref 24–36)

## 2016-12-05 LAB — BASIC METABOLIC PANEL
Anion gap: 4 — ABNORMAL LOW (ref 5–15)
BUN: 10 mg/dL (ref 6–20)
CO2: 25 mmol/L (ref 22–32)
CREATININE: 1.09 mg/dL — AB (ref 0.44–1.00)
Calcium: 8.3 mg/dL — ABNORMAL LOW (ref 8.9–10.3)
Chloride: 106 mmol/L (ref 101–111)
GFR calc Af Amer: >60 mL/min (ref >60)
GLUCOSE: 231 mg/dL — AB (ref 65–99)
Potassium: 4.1 mmol/L (ref 3.5–5.1)
SODIUM: 135 mmol/L (ref 135–145)

## 2016-12-05 LAB — BETA-2-GLYCOPROTEIN I ABS, IGG/M/A
Beta-2-Glycoprotein I IgA: <9 GPI IgA units (ref 0–25)
Beta-2-Glycoprotein I IgM: <9 GPI IgM units (ref 0–32)

## 2016-12-05 LAB — HEPARIN LEVEL (UNFRACTIONATED): HEPARIN UNFRACTIONATED: 1.28 [IU]/mL — AB (ref 0.30–0.70)

## 2016-12-05 MED ORDER — HEPARIN (PORCINE) IN NACL 100-0.45 UNIT/ML-% IJ SOLN
1600.0000 [IU]/h | INTRAMUSCULAR | Status: AC
  Administered 2016-12-05: 1300 [IU]/h via INTRAVENOUS
  Administered 2016-12-06 – 2016-12-07 (×2): 1600 [IU]/h via INTRAVENOUS
  Filled 2016-12-05 (×3): qty 250

## 2016-12-05 MED ORDER — ACETAMINOPHEN 500 MG PO TABS
1000.0000 mg | ORAL_TABLET | Freq: Three times a day (TID) | ORAL | Status: DC
  Administered 2016-12-05 – 2016-12-11 (×14): 1000 mg via ORAL
  Filled 2016-12-05 (×17): qty 2

## 2016-12-05 MED ORDER — GADOBENATE DIMEGLUMINE 529 MG/ML IV SOLN
20.0000 mL | Freq: Once | INTRAVENOUS | Status: AC
  Administered 2016-12-05: 20 mL via INTRAVENOUS

## 2016-12-05 MED ORDER — GI COCKTAIL ~~LOC~~
30.0000 mL | Freq: Once | ORAL | Status: AC
  Administered 2016-12-05: 30 mL via ORAL
  Filled 2016-12-05: qty 30

## 2016-12-05 NOTE — Progress Notes (Signed)
ANTICOAGULATION CONSULT NOTE - Initial Consult  Pharmacy Consult for Heparin Indication: recent hx DVT and PE (Eliquis on hold)  Allergies  Allergen Reactions  . Lisinopril Nausea Only and Swelling  . Morphine And Related Itching and Swelling    Patient Measurements: Height: 5\' 7"  (170.2 cm) Weight: 219 lb (99.3 kg) IBW/kg (Calculated) : 61.6 Heparin Dosing Weight: 82.5 kg  Vital Signs: Temp: 99.3 F (37.4 C) (08/14 1346) Temp Source: Oral (08/14 1346) BP: 172/110 (08/14 1346) Pulse Rate: 103 (08/14 1346)  Labs:  Recent Labs  12/03/16 0514 12/04/16 0254 12/05/16 0650  HGB 9.0* 8.9* 9.2*  HCT 26.3* 26.4* 27.5*  PLT 434* 412* 405*  CREATININE 0.89 1.16* 1.09*    Estimated Creatinine Clearance: 80.6 mL/min (A) (by C-G formula based on SCr of 1.09 mg/dL (H)).   Medical History: Past Medical History:  Diagnosis Date  . Abscess of tunica vaginalis    10/09- Abundant S. aureus- sensitive to all abx  . Anxiety   . Blood dyscrasia   . CAD (coronary artery disease) 06/15/2006   s/p Subendocardial MI with PDA angioplasty(no stent) on 06/15/06 and relook  cath 06/19/06 showed patency of site. Cath 12/10- no restenosis or significant CAD progression  . CVA (cerebral vascular accident) ([place]) ~ 02/2014   denies residual on 04/22/2014  . CVA (cerebral vascular accident) [facility])    history of remote right cerebellar infarct noted on head CT at least since 10/2011  . Depression   . Diabetes mellitus type 2, uncontrolled, with complications ([place])   . Fibromyalgia   . Gastritis   . Gastroparesis    secondary to poorly controlled DM, last emptying study performed 01/2010  was normal but may be falsely positive as pt was on reglan  . GERD (gastroesophageal reflux disease)   . Hepatitis B, chronic (HCC)    Hep BeAb+,Hep B cAb+ & Hep BsAg+ (9/06)  . History of pyelonephritis    H/o GrpB Pyelonephritis (9/06) and UTI- 07/11- E.Coli, 12/10- GBS  . Hyperlipidemia   . Hypertension    . Iron deficiency anemia   . Irregular menses    Small ovarian follicles seen on RC(7/89)  . MI (myocardial infarction) ([place]) 05/2006   PDA percutaneous transluminal coronary angioplasty  . Migraine    "weekly" (04/22/2014)  . N&V (nausea and vomiting)    Chronic. Unclear etiology with multiple admission and ED visits. CT abdomen with and without contrast (02/2011)  showed no acute process. Gastic Emptying scan (01/2010) was normal. Ultrasound of the abdomen was within normal limits. Hepatitis B viral load was undectable. HIV NR. EGD - gastritis, Hpylori + s/p Rx  . Obesity   . OSA (obstructive sleep apnea)    "suppose to wear mask but I don't" (04/22/2014)  . Peripheral neuropathy   . Pneumonia    "this is probably the 2nd or 3rd time I've had pneumonia" (04/22/2014)  . Recurrent boils   . Seasonal asthma   . Substance abuse   . Thrombocytosis (HCC)    Hem/Onc suggested 2/2 chronic hepatits and/or iron deficiency anemia    Medications:  Scheduled:  . acetaminophen  650 mg Oral Q6H   Or  . acetaminophen  650 mg Rectal Q6H  . apixaban  5 mg Oral BID  . aspirin EC  81 mg Oral Daily  . ferrous sulfate  325 mg Oral BID WC  . insulin aspart  0-9 Units Subcutaneous TID WC  . insulin aspart protamine- aspart  25 Units Subcutaneous BID  WC  . megestrol  80 mg Oral BID  . metoCLOPramide  10 mg Oral TID AC & HS  . metoprolol tartrate  50 mg Oral BID  . pantoprazole  80 mg Oral Daily  . polyethylene glycol  17 g Oral Daily  . pravastatin  20 mg Oral q1800  . senna  1 tablet Oral BID   Infusions:    Assessment: 41 yo F on Eliquis PTA for hx RLE DVT and bilateral PE.  Pt presented to ED with abdominal pain, nausea, and vaginal bleeding.  Pt has a hx of uterine fibroids and reports ongoing vaginal bleeding since the initiation of oral anticoagulation.  Now considering uterine fibroid embolization.  Pharmacy has been asked to transition patient from Eliquis to heparin infusion pending  procedure.    Of note, Eliquis can alter the accuracy of heparin levels and therefor dosing will be based on aPTTs until heparin levels and aPTTs correlate.    Goal of Therapy:  Heparin level 0.3-0.7 units/ml aPTT 66-102 seconds Monitor platelets by anticoagulation protocol: Yes   Plan:  Baseline aPTT and HL ordered for 1600 At 2200, start heparin infusion at 1300 units/hr Heparin level, CBC, and aPTT ordered for 8/15 0600 and daily.  [employer], Pharm.D., BCPS Clinical Pharmacist Pager: [phone] Clinical phone for 12/05/2016 from 8:30-4:00 is [phone]. After 4pm, please call Main Rx [phone]) for assistance. 12/05/2016 2:02 PM

## 2016-12-05 NOTE — Progress Notes (Signed)
Subjective: No acute events overnight, she reports her symptoms of lower abdominal pain, nausea, and vaginal bleeding are largely unchanged this morning. Continues to tolerate PO intake without emesis. She reports going through 4 pads yesterday due to her vaginal bleeding.    Objective:  Vital signs in last 24 hours: Vitals:   12/04/16 2127 12/04/16 2145 12/05/16 0608 12/05/16 0927  BP: (!) 159/98 (!) 153/89 (!) 144/88 (!) 162/109  Pulse: (!) 115 96 83 97  Resp:  20 20   Temp:  98.6 F (37 C) 99 F (37.2 C)   TempSrc:  Oral Oral   SpO2:  100% 100%   Weight:   219 lb (99.3 kg)   Height:       Physical Exam  Constitutional: She is oriented to person, place, and time.  Obese female resting in bed in no acute distress   HENT:  Head: Normocephalic and atraumatic.  Cardiovascular: Normal rate, regular rhythm and normal heart sounds.   Pulmonary/Chest: Effort normal and breath sounds normal. No respiratory distress.  Abdominal: Soft. Bowel sounds are normal. She exhibits no distension. There is no rebound and no guarding.  Mild tenderness to palpation predominately of the lower abdomen    Musculoskeletal:  Tenderness to posterior R calf consistent with DVT hx   Neurological: She is alert and oriented to person, place, and time.  Skin: Skin is warm and dry.     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 No significant electrolyte abnormalities. She has tolerated PO intake at home and was able to quickly advance her diet. Her abdominal pain is now reported as lower and may now be related to uterine fibroid.   --Anti-emetics- IV Phenergan q6hr prn, PO Reglan TID, Zofran q6hr prn --Pain control- PO Tramadol q6hr prn, scheduled Tylenol, will try to avoid further narcotics that would decrease gut motility   --Continue home  PPI --Bowel regimen- miralax, senna --BMP daily     Vaginal bleeding, known uterine fundal fibroid Iron deficiency anemia  Known history of uterine fibroid with associated irregular uterine bleeding, imaged with pelvic, transvaginal US on admission. Ongoing vaginal bleeding since being on anti-coagulation for PE though conflicting information regarding severity and response to megestrol. Hgb 10.0 on presentation, improved from prior suggesting bleeding has improved. Pt was also not taking oral Fe as outpatient, received IV Fe on recent admission. Her need for anti-coagulation complicates fibroid management, not an ideal surgical candidate given VTE. Could consider whether this pt would be appropriate for embolization of fibroid to alleviate bleeding. Current Hgb 8.9. --Pelvic MR w and wo contrast for further characterization --Continue Megace 80 mg BID --Continue oral Fe --CBC daily   History of Bilateral PE, L LE DVT  Stable on recent imaging, on home Eliquis after switching from Xarelto. On further review it appears her DVT/PE may have been considered provoked with some small increased risk with Depo-provera, hospitalization 2 months prior. Normal AT3 and beta 2 glycoprotein abs.   --Transition to Heparin pending workup for embolization as above  --Antiphospholipid Ab labs, Factor V Leiden, Prothrombin gene mutation labs pending     Diabetes Mellitus, insulin dependent On home insulin 70/30 25 U BID, most recent A1c 8.6 in 08/2016. --Novolog 70/30 25 U BID, SSI    Dispo: Anticipated discharge in approximately 1-2 day(s).   Tawny Asal, MD 12/05/2016, 11:25  AM Pager: [phone]

## 2016-12-05 NOTE — Progress Notes (Signed)
Inpatient Diabetes Program Recommendations  AACE/ADA: New Consensus Statement on Inpatient Glycemic Control (2015)  Target Ranges:  Prepandial:   less than 140 mg/dL      Peak postprandial:   less than 180 mg/dL (1-2 hours)      Critically ill patients:  140 - 180 mg/dL   Results for Shirley [name], Shirley [name] (MRN [id number]) as of 12/05/2016 10:19  Ref. Range 12/04/2016 08:20 12/04/2016 12:08 12/04/2016 17:44 12/04/2016 21:44 12/05/2016 06:40 12/05/2016 07:39  Glucose-Capillary Latest Ref Range: 65 - 99 mg/dL 315 (H) 258 (H) 357 (H) 320 (H) 222 (H) 208 (H)   Review of Glycemic Control  Diabetes history: DM2 Outpatient Diabetes medications: 70/30 25 units BID Current orders for Inpatient glycemic control: 70/30 25 units BID, Novolog 0-9 units TID with meals  Inpatient Diabetes Program Recommendations: Insulin - Basal: Noted 70/30 increased to 25 units BID yesterday evening. If glucose remains elevated greater than 180 mg/dl with increased dose of 70/30, please consider further increasing 70/30 to 28 units BID. Correction (SSI): Please consider ordering Novolog 0-5 units QHS for bedtime correction.  Thanks, Barnie Alderman, RN, MSN, CDE Diabetes Coordinator Inpatient Diabetes Program [phone] (Team Pager from 8am to 5pm)

## 2016-12-06 ENCOUNTER — Encounter (HOSPITAL_COMMUNITY): Payer: Self-pay | Admitting: General Surgery

## 2016-12-06 DIAGNOSIS — D259 Leiomyoma of uterus, unspecified: Principal | ICD-10-CM

## 2016-12-06 LAB — GLUCOSE, CAPILLARY
GLUCOSE-CAPILLARY: 181 mg/dL — AB (ref 65–99)
GLUCOSE-CAPILLARY: 247 mg/dL — AB (ref 65–99)
GLUCOSE-CAPILLARY: 93 mg/dL (ref 65–99)
Glucose-Capillary: 194 mg/dL — ABNORMAL HIGH (ref 65–99)
Glucose-Capillary: 240 mg/dL — ABNORMAL HIGH (ref 65–99)

## 2016-12-06 LAB — CBC
HCT: 28.2 % — ABNORMAL LOW (ref 36.0–46.0)
Hemoglobin: 9.5 g/dL — ABNORMAL LOW (ref 12.0–15.0)
MCH: 25.7 pg — ABNORMAL LOW (ref 26.0–34.0)
MCHC: 33.7 g/dL (ref 30.0–36.0)
MCV: 76.4 fL — AB (ref 78.0–100.0)
PLATELETS: 442 10*3/uL — AB (ref 150–400)
RBC: 3.69 MIL/uL — ABNORMAL LOW (ref 3.87–5.11)
RDW: 20.4 % — AB (ref 11.5–15.5)
WBC: 11.3 10*3/uL — AB (ref 4.0–10.5)

## 2016-12-06 LAB — HEPARIN LEVEL (UNFRACTIONATED)
HEPARIN UNFRACTIONATED: 0.92 [IU]/mL — AB (ref 0.30–0.70)
Heparin Unfractionated: 1.01 IU/mL — ABNORMAL HIGH (ref 0.30–0.70)

## 2016-12-06 LAB — APTT
APTT: 49 s — AB (ref 24–36)
APTT: 76 s — AB (ref 24–36)

## 2016-12-06 MED ORDER — KETOROLAC TROMETHAMINE 30 MG/ML IJ SOLN
30.0000 mg | INTRAMUSCULAR | Status: AC
  Administered 2016-12-07: 30 mg via INTRAVENOUS

## 2016-12-06 MED ORDER — CEFAZOLIN SODIUM-DEXTROSE 2-4 GM/100ML-% IV SOLN
2.0000 g | INTRAVENOUS | Status: AC
  Administered 2016-12-07: 2 g via INTRAVENOUS

## 2016-12-06 NOTE — Progress Notes (Signed)
Subjective: No acute events overnight, reports unchanged symptoms of abdominal pain and vaginal bleeding, went through four pads yesterday. She notes increased discomfort during the MRI scan (read notes motion artifact as a result), and subsequent increased nausea, episode of emesis, and decreased appetite. Her nausea improved overnight and she is eating breakfast without difficulty. Last BM last night   Objective:  Vital signs in last 24 hours: Vitals:   12/05/16 2105 12/05/16 2346 12/06/16 0500 12/06/16 0641  BP: (!) 188/103 (!) 146/79  (!) 148/88  Pulse: (!) 113 99  99  Resp: 18 18  18   Temp: 98.3 F (36.8 C) 97.8 F (36.6 C)  98.6 F (37 C)  TempSrc: Oral Oral  Oral  SpO2: 100% 100%  100%  Weight:   216 lb (98 kg)   Height:       Physical Exam  Constitutional: She is oriented to person, place, and time.  Obese female eating breakfast, in no acute distress   HENT:  Head: Normocephalic and atraumatic.  Cardiovascular: Normal rate, regular rhythm and normal heart sounds.   Pulmonary/Chest: Effort normal and breath sounds normal. No respiratory distress.  Abdominal: Soft. Bowel sounds are normal. She exhibits no distension. There is no rebound and no guarding.  Slight tenderness to palpation predominately of the lower abdomen    Musculoskeletal:  Tenderness to posterior R calf consistent with DVT hx   Neurological: She is alert and oriented to person, place, and time.  Skin: Skin is warm and dry.     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 No significant electrolyte abnormalities. She has tolerated PO intake at home and was able to quickly advance her diet. Her abdominal pain is now reported as lower and may now be related to uterine fibroid.   --Anti-emetics- IV Phenergan q6hr prn, PO Reglan TID, Zofran q6hr  prn --Pain control- PO Tramadol q6hr prn, scheduled Tylenol, will try to avoid further narcotics that would decrease gut motility   --Continue home PPI --Bowel regimen- miralax, senna --BMP daily     Vaginal bleeding, known uterine fundal fibroid Iron deficiency anemia  Known history of uterine fibroid with associated irregular uterine bleeding, imaged with pelvic, transvaginal US on admission. Ongoing vaginal bleeding since being on anti-coagulation for PE though conflicting information regarding severity and response to megestrol. Hgb 10.0 on presentation, improved from prior suggesting bleeding has improved. Pt was also not taking oral Fe as outpatient, received IV Fe on recent admission. Her need for anti-coagulation complicates fibroid management, not an ideal surgical candidate given VTE. MRI pelvis confirmed uterine fibroid rather than adenomyosis. Current Hgb 9.5. --F/u IR evaluation --Continue Megace 80 mg BID --Continue oral Fe --CBC daily   History of Bilateral PE, L LE DVT  Stable on recent imaging, on home Eliquis after switching from Xarelto. On further review it appears her DVT/PE may have been considered provoked with some small increased risk with Depo-provera, hospitalization 2 months prior. Normal AT3, beta 2 glycoprotein and anticardiolipin abs.   --Transition to Heparin pending workup for embolization as above  --Antiphospholipid Ab labs, Factor V Leiden, Prothrombin gene mutation labs pending     Diabetes Mellitus, insulin dependent On home insulin 70/30 25 U BID, most recent A1c 8.6 in 08/2016. --Novolog 70/30 25 U BID, SSI    Dispo: Anticipated discharge in approximately 1-2 day(s).  Tawny Asal, MD 12/06/2016, 9:47 AM Pager: [phone]

## 2016-12-06 NOTE — Progress Notes (Signed)
ANTICOAGULATION CONSULT NOTE - Follow Up Consult  Pharmacy Consult for heparin Indication: recent PE/DVT  Labs:  Recent Labs  12/04/16 0254 12/05/16 0650 12/05/16 1942 12/06/16 0631  HGB 8.9* 9.2*  --  9.5*  HCT 26.4* 27.5*  --  28.2*  PLT 412* 405*  --  442*  APTT  --   --  26 49*  HEPARINUNFRC  --   --  1.28* 0.92*  CREATININE 1.16* 1.09*  --   --     Assessment: 41yo female subtherapeutic on heparin with initial dosing (using PTT while Eliquis on hold).  Goal of Therapy:  aPTT 66-102 seconds   Plan:  Will increase heparin gtt by 3 units/kg/hr to 1600 units/hr and check PTT in 6hr.  Wynona Neat, PharmD, BCPS  12/06/2016,7:48 AM

## 2016-12-06 NOTE — Consult Note (Signed)
Chief Complaint: menorrhagia   Referring Physician:Dr. Murriel Hopper   Supervising Physician: Corrie Mckusick  Patient Status: [facility] - In-pt  HPI: Shirley [name] is a 41 y.o. female with a PMH including, CAD, MI, DM with complications from this, CVA, hep B who was found to have a DVT/PE in June and was placed on Eliquis.  She then developed menorrhagia, significantly enough that she required hospitalization this admission.  She states that her periods are normally 7 days with the first 3 days being heavy, but now she is just bleeding all the time whether is it her cycle time or not.  She is passing small clots.  She has been found to have a 6cm fibroid which is likely contributing to the bleeding along with her anticoagulation.  She is currently on a heparin drip.  Her hgb is stable.  We have been asked to evaluate her for a uterine artery embolization to help decrease flow to this fibroid and overall decrease bleeding.  Past Medical History:  Past Medical History:  Diagnosis Date  . Abscess of tunica vaginalis    10/09- Abundant S. aureus- sensitive to all abx  . Anxiety   . Blood dyscrasia   . CAD (coronary artery disease) 06/15/2006   s/p Subendocardial MI with PDA angioplasty(no stent) on 06/15/06 and relook  cath 06/19/06 showed patency of site. Cath 12/10- no restenosis or significant CAD progression  . CVA (cerebral vascular accident) ([place]) ~ 02/2014   denies residual on 04/22/2014  . CVA (cerebral vascular accident) [facility])    history of remote right cerebellar infarct noted on head CT at least since 10/2011  . Depression   . Diabetes mellitus type 2, uncontrolled, with complications ([place])   . Fibromyalgia   . Gastritis   . Gastroparesis    secondary to poorly controlled DM, last emptying study performed 01/2010  was normal but may be falsely positive as pt was on reglan  . GERD (gastroesophageal reflux disease)   . Hepatitis B, chronic (HCC)    Hep BeAb+,Hep B cAb+ & Hep  BsAg+ (9/06)  . History of pyelonephritis    H/o GrpB Pyelonephritis (9/06) and UTI- 07/11- E.Coli, 12/10- GBS  . Hyperlipidemia   . Hypertension   . Iron deficiency anemia   . Irregular menses    Small ovarian follicles seen on [place](0/97)  . MI (myocardial infarction) ([place]) 05/2006   PDA percutaneous transluminal coronary angioplasty  . Migraine    "weekly" (04/22/2014)  . N&V (nausea and vomiting)    Chronic. Unclear etiology with multiple admission and ED visits. CT abdomen with and without contrast (02/2011)  showed no acute process. Gastic Emptying scan (01/2010) was normal. Ultrasound of the abdomen was within normal limits. Hepatitis B viral load was undectable. HIV NR. EGD - gastritis, Hpylori + s/p Rx  . Obesity   . OSA (obstructive sleep apnea)    "suppose to wear mask but I don't" (04/22/2014)  . Peripheral neuropathy   . Pneumonia    "this is probably the 2nd or 3rd time I've had pneumonia" (04/22/2014)  . Recurrent boils   . Seasonal asthma   . Substance abuse   . Thrombocytosis ([place])    Hem/Onc suggested 2/2 chronic hepatits and/or iron deficiency anemia    Past Surgical History:  Past Surgical History:  Procedure Laterality Date  . CESAREAN SECTION  1997  . CORONARY ANGIOPLASTY WITH STENT PLACEMENT  2008   "2 stents"  . ESOPHAGOGASTRODUODENOSCOPY N/A 04/23/2014  Procedure: ESOPHAGOGASTRODUODENOSCOPY (EGD);  Surgeon: Winfield Cunas., MD;  Location: [facility] ENDOSCOPY;  Service: Endoscopy;  Laterality: N/A;    Family History:  Family History  Problem Relation Age of Onset  . Diabetes Father     Social History:  reports that she quit smoking about 20 years ago. Her smoking use included Cigarettes. She has never used smokeless tobacco. She reports that she does not drink alcohol or use drugs.  Allergies:  Allergies  Allergen Reactions  . Lisinopril Nausea Only and Swelling  . Morphine And Related Itching and Swelling    Medications: Medications reviewed in  epic  Please HPI for pertinent positives, otherwise complete 10 system ROS negative.  Mallampati Score: MD Evaluation Airway: WNL Heart: WNL Abdomen: WNL Chest/ Lungs: WNL ASA  Classification: 3 Mallampati/Airway Score: Two  Physical Exam: BP (!) 148/88 (BP Location: Right Arm)   Pulse 99   Temp 98.6 F (37 C) (Oral)   Resp 18   Ht 5' 7"  (1.702 m)   Wt 216 lb (98 kg)   SpO2 100%   BMI 33.83 kg/m  Body mass index is 33.83 kg/m. General: pleasant, obese black female who is laying in bed in NAD HEENT: head is normocephalic, atraumatic.  Sclera are noninjected.  PERRL.  Ears and nose without any masses or lesions.  Mouth is pink and moist Heart: regular, rate, and rhythm.  Normal s1,s2. No obvious murmurs, gallops, or rubs noted.  Palpable radial pulses bilaterally Lungs: CTAB, no wheezes, rhonchi, or rales noted.  Respiratory effort nonlabored Abd: soft, tender especially in lower abdomen, ND, +BS, no masses, hernias, or organomegaly Psych: A&Ox3 with an appropriate affect.   Labs: Results for orders placed or performed during the hospital encounter of 12/02/16 (from the past 48 hour(s))  Glucose, capillary     Status: Abnormal   Collection Time: 12/04/16  5:44 PM  Result Value Ref Range   Glucose-Capillary 357 (H) 65 - 99 mg/dL  Glucose, capillary     Status: Abnormal   Collection Time: 12/04/16  9:44 PM  Result Value Ref Range   Glucose-Capillary 320 (H) 65 - 99 mg/dL   Comment 1 Notify RN    Comment 2 Document in Chart   Glucose, capillary     Status: Abnormal   Collection Time: 12/05/16  6:40 AM  Result Value Ref Range   Glucose-Capillary 222 (H) 65 - 99 mg/dL   Comment 1 Notify RN    Comment 2 Document in Chart   Basic metabolic panel     Status: Abnormal   Collection Time: 12/05/16  6:50 AM  Result Value Ref Range   Sodium 135 135 - 145 mmol/L   Potassium 4.1 3.5 - 5.1 mmol/L   Chloride 106 101 - 111 mmol/L   CO2 25 22 - 32 mmol/L   Glucose, Bld 231 (H) 65  - 99 mg/dL   BUN 10 6 - 20 mg/dL   Creatinine, Ser 1.09 (H) 0.44 - 1.00 mg/dL   Calcium 8.3 (L) 8.9 - 10.3 mg/dL   GFR calc non Af Amer >60 >60 mL/min   GFR calc Af Amer >60 >60 mL/min    Comment: (NOTE) The eGFR has been calculated using the CKD EPI equation. This calculation has not been validated in all clinical situations. eGFR's persistently <60 mL/min signify possible Chronic Kidney Disease.    Anion gap 4 (L) 5 - 15  CBC     Status: Abnormal   Collection Time: 12/05/16  6:50  AM  Result Value Ref Range   WBC 8.0 4.0 - 10.5 K/uL   RBC 3.58 (L) 3.87 - 5.11 MIL/uL   Hemoglobin 9.2 (L) 12.0 - 15.0 g/dL   HCT 27.5 (L) 36.0 - 46.0 %   MCV 76.8 (L) 78.0 - 100.0 fL   MCH 25.7 (L) 26.0 - 34.0 pg   MCHC 33.5 30.0 - 36.0 g/dL   RDW 20.2 (H) 11.5 - 15.5 %   Platelets 405 (H) 150 - 400 K/uL  Glucose, capillary     Status: Abnormal   Collection Time: 12/05/16  7:39 AM  Result Value Ref Range   Glucose-Capillary 208 (H) 65 - 99 mg/dL  Glucose, capillary     Status: Abnormal   Collection Time: 12/05/16 11:53 AM  Result Value Ref Range   Glucose-Capillary 178 (H) 65 - 99 mg/dL  Glucose, capillary     Status: Abnormal   Collection Time: 12/05/16  5:39 PM  Result Value Ref Range   Glucose-Capillary 213 (H) 65 - 99 mg/dL  Heparin level (unfractionated)     Status: Abnormal   Collection Time: 12/05/16  7:42 PM  Result Value Ref Range   Heparin Unfractionated 1.28 (H) 0.30 - 0.70 IU/mL    Comment: RESULTS CONFIRMED BY MANUAL DILUTION        IF HEPARIN RESULTS ARE BELOW EXPECTED VALUES, AND PATIENT DOSAGE HAS BEEN CONFIRMED, SUGGEST FOLLOW UP TESTING OF ANTITHROMBIN III LEVELS.   APTT     Status: None   Collection Time: 12/05/16  7:42 PM  Result Value Ref Range   aPTT 26 24 - 36 seconds  Glucose, capillary     Status: None   Collection Time: 12/05/16  9:02 PM  Result Value Ref Range   Glucose-Capillary 82 65 - 99 mg/dL  Glucose, capillary     Status: None   Collection Time:  12/06/16 12:08 AM  Result Value Ref Range   Glucose-Capillary 93 65 - 99 mg/dL  Heparin level (unfractionated)     Status: Abnormal   Collection Time: 12/06/16  6:31 AM  Result Value Ref Range   Heparin Unfractionated 0.92 (H) 0.30 - 0.70 IU/mL    Comment:        IF HEPARIN RESULTS ARE BELOW EXPECTED VALUES, AND PATIENT DOSAGE HAS BEEN CONFIRMED, SUGGEST FOLLOW UP TESTING OF ANTITHROMBIN III LEVELS.   CBC     Status: Abnormal   Collection Time: 12/06/16  6:31 AM  Result Value Ref Range   WBC 11.3 (H) 4.0 - 10.5 K/uL   RBC 3.69 (L) 3.87 - 5.11 MIL/uL   Hemoglobin 9.5 (L) 12.0 - 15.0 g/dL   HCT 28.2 (L) 36.0 - 46.0 %   MCV 76.4 (L) 78.0 - 100.0 fL   MCH 25.7 (L) 26.0 - 34.0 pg   MCHC 33.7 30.0 - 36.0 g/dL   RDW 20.4 (H) 11.5 - 15.5 %   Platelets 442 (H) 150 - 400 K/uL  APTT     Status: Abnormal   Collection Time: 12/06/16  6:31 AM  Result Value Ref Range   aPTT 49 (H) 24 - 36 seconds    Comment:        IF BASELINE aPTT IS ELEVATED, SUGGEST PATIENT RISK ASSESSMENT BE USED TO DETERMINE APPROPRIATE ANTICOAGULANT THERAPY.   Glucose, capillary     Status: Abnormal   Collection Time: 12/06/16  8:01 AM  Result Value Ref Range   Glucose-Capillary 181 (H) 65 - 99 mg/dL  Glucose, capillary  Status: Abnormal   Collection Time: 12/06/16 12:13 PM  Result Value Ref Range   Glucose-Capillary 194 (H) 65 - 99 mg/dL   Comment 1 Document in Chart     Imaging: Mr Pelvis W Wo Contrast  Result Date: 12/05/2016 CLINICAL DATA:  Pelvic pain and vaginal bleeding. EXAM: MRI PELVIS WITHOUT AND WITH CONTRAST TECHNIQUE: Multiplanar multisequence MR imaging of the pelvis was performed both before and after administration of intravenous contrast. CONTRAST:  70m MULTIHANCE GADOBENATE DIMEGLUMINE 529 MG/ML IV SOLN COMPARISON:  Pelvic ultrasound 12/02/2016 and CT scan 11/29/2016 FINDINGS: Examination is quite limited due to patient motion. Patient would not or could not hold still for the  examination. Urinary Tract: The bladder appears normal. No bladder lesions or asymmetric bladder wall thickening. No ureteral dilatation. Bowel:  The visualized small bowel and colon are grossly normal. Vascular/Lymphatic: The major vascular structures appear patent. Reproductive: As demonstrated on the prior imaging studies there is a dominant posterior uterine fibroids centered in the myometrium. This measures approximately 6.4 x 6.2 x 5.4 cm. There is some mass effect on the endometrium versus displaced anteriorly and to the left. The endometrium is normal in thickness. No endometrial lesions are identified. The junctional zone appears slightly thickened and slightly nodular but no definite findings for adenomyomatosis. The cervix appears normal. Both ovaries appear normal. Other:  No ascites.  No inguinal mass or adenopathy. Musculoskeletal: No significant bony findings. IMPRESSION: 1. 6 cm uterine fibroid with mild mass effect on the endometrium. Otherwise the endometrium appears normal. 2. Normal ovaries. 3. No pelvic lymphadenopathy. Electronically Signed   By: PMarijo SanesM.D.   On: 12/05/2016 18:36    Assessment/Plan 1. DVT /PE on anticoagulation 2. Menorrhagia secondary to large uterine fibroid  The patient is having menorrhagia exacerbated by her anticoagulant that is required from her DVT/PE.  Her hgb is currently stable, but her bleeding persists.  We will proceed with a uterine artery embolization tomorrow.  She will be NPO p M.   Risks and benefits discussed with the patient including, but not limited to bleeding, infection, vascular injury or contrast induced renal failure. All of the patient's questions were answered, patient is agreeable to proceed. Consent signed and in chart.   Thank you for this interesting consult.  I greatly enjoyed meeting TJazelN Fedor and look forward to participating in their care.  A copy of this report was sent to the requesting provider on this  date.  Electronically Signed: OHenreitta Cea8/15/2018, 12:31 PM   I spent a total of 40 Minutes    in face to face in clinical consultation, greater than 50% of which was counseling/coordinating care for menorrhagia

## 2016-12-06 NOTE — Progress Notes (Signed)
ANTICOAGULATION CONSULT NOTE - Follow Up Consult  Pharmacy Consult for heparin Indication: recent PE/DVT  Labs:  Recent Labs  12/04/16 0254 12/05/16 0650 12/05/16 1942 12/06/16 0631 12/06/16 1858  HGB 8.9* 9.2*  --  9.5*  --   HCT 26.4* 27.5*  --  28.2*  --   PLT 412* 405*  --  442*  --   APTT  --   --  26 49* 76*  HEPARINUNFRC  --   --  1.28* 0.92* 1.01*  CREATININE 1.16* 1.09*  --   --   --     Assessment: 41yo female therapeutic on heparin with initial dosing (using PTT while Eliquis on hold).  Goal of Therapy:  aPTT 66-102 seconds   Plan:  Continue heparin at 1600 units / hr Follow up AM labs  Thank you Anette Guarneri, PharmD [phone] 12/06/2016,8:14 PM

## 2016-12-07 ENCOUNTER — Encounter (HOSPITAL_COMMUNITY): Payer: Self-pay | Admitting: Interventional Radiology

## 2016-12-07 ENCOUNTER — Inpatient Hospital Stay (HOSPITAL_COMMUNITY): Payer: Self-pay

## 2016-12-07 DIAGNOSIS — Z86718 Personal history of other venous thrombosis and embolism: Secondary | ICD-10-CM

## 2016-12-07 DIAGNOSIS — D500 Iron deficiency anemia secondary to blood loss (chronic): Secondary | ICD-10-CM

## 2016-12-07 DIAGNOSIS — Z86711 Personal history of pulmonary embolism: Secondary | ICD-10-CM

## 2016-12-07 DIAGNOSIS — R1031 Right lower quadrant pain: Secondary | ICD-10-CM

## 2016-12-07 DIAGNOSIS — Z9889 Other specified postprocedural states: Secondary | ICD-10-CM

## 2016-12-07 DIAGNOSIS — R110 Nausea: Secondary | ICD-10-CM

## 2016-12-07 DIAGNOSIS — Z8742 Personal history of other diseases of the female genital tract: Secondary | ICD-10-CM

## 2016-12-07 HISTORY — PX: IR ANGIOGRAM SELECTIVE EACH ADDITIONAL VESSEL: IMG667

## 2016-12-07 HISTORY — PX: IR EMBO ARTERIAL NOT HEMORR HEMANG INC GUIDE ROADMAPPING: IMG5448

## 2016-12-07 HISTORY — PX: IR ANGIOGRAM PELVIS SELECTIVE OR SUPRASELECTIVE: IMG661

## 2016-12-07 HISTORY — PX: IR US GUIDE VASC ACCESS RIGHT: IMG2390

## 2016-12-07 LAB — GLUCOSE, CAPILLARY
GLUCOSE-CAPILLARY: 186 mg/dL — AB (ref 65–99)
GLUCOSE-CAPILLARY: 227 mg/dL — AB (ref 65–99)
Glucose-Capillary: 213 mg/dL — ABNORMAL HIGH (ref 65–99)
Glucose-Capillary: 169 mg/dL — ABNORMAL HIGH (ref 65–99)

## 2016-12-07 LAB — CBC
HEMATOCRIT: 28.5 % — AB (ref 36.0–46.0)
Hemoglobin: 9.6 g/dL — ABNORMAL LOW (ref 12.0–15.0)
MCH: 26.1 pg (ref 26.0–34.0)
MCHC: 33.7 g/dL (ref 30.0–36.0)
MCV: 77.4 fL — ABNORMAL LOW (ref 78.0–100.0)
Platelets: 403 10*3/uL — ABNORMAL HIGH (ref 150–400)
RBC: 3.68 MIL/uL — ABNORMAL LOW (ref 3.87–5.11)
RDW: 20.4 % — AB (ref 11.5–15.5)
WBC: 11.1 10*3/uL — AB (ref 4.0–10.5)

## 2016-12-07 LAB — PROTIME-INR
INR: 1.14
PROTHROMBIN TIME: 14.6 s (ref 11.4–15.2)

## 2016-12-07 LAB — APTT: aPTT: 76 seconds — ABNORMAL HIGH (ref 24–36)

## 2016-12-07 LAB — PROTHROMBIN GENE MUTATION

## 2016-12-07 LAB — HCG, SERUM, QUALITATIVE: PREG SERUM: NEGATIVE

## 2016-12-07 LAB — HEPARIN LEVEL (UNFRACTIONATED): HEPARIN UNFRACTIONATED: 0.94 [IU]/mL — AB (ref 0.30–0.70)

## 2016-12-07 MED ORDER — SODIUM CHLORIDE 0.9% FLUSH: 9.0000 mL | INTRAVENOUS | Status: DC | PRN

## 2016-12-07 MED ORDER — HYDRALAZINE HCL 20 MG/ML IJ SOLN
INTRAMUSCULAR | Status: AC | PRN
  Administered 2016-12-07: 10 mg via INTRAVENOUS

## 2016-12-07 MED ORDER — HYDROCHLOROTHIAZIDE 25 MG PO TABS
25.0000 mg | ORAL_TABLET | Freq: Every day | ORAL | Status: DC
  Administered 2016-12-08 – 2016-12-11 (×4): 25 mg via ORAL
  Filled 2016-12-07 (×6): qty 1

## 2016-12-07 MED ORDER — MIDAZOLAM HCL 2 MG/2ML IJ SOLN
INTRAMUSCULAR | Status: AC | PRN
  Administered 2016-12-07 (×3): 1 mg via INTRAVENOUS

## 2016-12-07 MED ORDER — DIPHENHYDRAMINE HCL 50 MG/ML IJ SOLN: 12.5000 mg | Freq: Four times a day (QID) | INTRAMUSCULAR | Status: DC | PRN

## 2016-12-07 MED ORDER — FENTANYL CITRATE (PF) 100 MCG/2ML IJ SOLN
INTRAMUSCULAR | Status: AC | PRN
  Administered 2016-12-07 (×2): 50 ug via INTRAVENOUS
  Administered 2016-12-07 (×3): 25 ug via INTRAVENOUS

## 2016-12-07 MED ORDER — DIPHENHYDRAMINE HCL 12.5 MG/5ML PO ELIX: 12.5000 mg | ORAL_SOLUTION | Freq: Four times a day (QID) | ORAL | Status: DC | PRN

## 2016-12-07 MED ORDER — KETOROLAC TROMETHAMINE 30 MG/ML IJ SOLN
INTRAMUSCULAR | Status: AC
  Administered 2016-12-07: 30 mg via INTRAVENOUS
  Filled 2016-12-07: qty 1

## 2016-12-07 MED ORDER — HYDROMORPHONE 1 MG/ML IV SOLN
INTRAVENOUS | Status: DC
  Administered 2016-12-07: 1.5 mg via INTRAVENOUS
  Administered 2016-12-07: 11:00:00 via INTRAVENOUS
  Administered 2016-12-08: 1.8 mg via INTRAVENOUS
  Administered 2016-12-08: 0.6 mg via INTRAVENOUS
  Administered 2016-12-08: 1.2 mg via INTRAVENOUS
  Filled 2016-12-07: qty 25

## 2016-12-07 MED ORDER — ONDANSETRON HCL 4 MG/2ML IJ SOLN
INTRAMUSCULAR | Status: AC
  Filled 2016-12-07: qty 2

## 2016-12-07 MED ORDER — HYDRALAZINE HCL 20 MG/ML IJ SOLN
INTRAMUSCULAR | Status: AC
  Filled 2016-12-07: qty 1

## 2016-12-07 MED ORDER — METOCLOPRAMIDE HCL 5 MG/ML IJ SOLN
10.0000 mg | Freq: Once | INTRAMUSCULAR | Status: AC
  Administered 2016-12-07: 10 mg via INTRAVENOUS
  Filled 2016-12-07: qty 2

## 2016-12-07 MED ORDER — CEFAZOLIN SODIUM-DEXTROSE 2-4 GM/100ML-% IV SOLN
INTRAVENOUS | Status: AC
  Filled 2016-12-07: qty 100

## 2016-12-07 MED ORDER — LIDOCAINE HCL (PF) 1 % IJ SOLN
INTRAMUSCULAR | Status: AC | PRN
  Administered 2016-12-07: 4 mL

## 2016-12-07 MED ORDER — HEPARIN (PORCINE) IN NACL 100-0.45 UNIT/ML-% IJ SOLN
1700.0000 [IU]/h | INTRAMUSCULAR | Status: DC
  Administered 2016-12-07: 1600 [IU]/h via INTRAVENOUS
  Administered 2016-12-08 – 2016-12-09 (×2): 1700 [IU]/h via INTRAVENOUS
  Filled 2016-12-07 (×2): qty 250

## 2016-12-07 MED ORDER — LOSARTAN POTASSIUM 50 MG PO TABS
50.0000 mg | ORAL_TABLET | Freq: Every day | ORAL | Status: DC
  Administered 2016-12-08 – 2016-12-11 (×4): 50 mg via ORAL
  Filled 2016-12-07 (×5): qty 1

## 2016-12-07 MED ORDER — HYDRALAZINE HCL 20 MG/ML IJ SOLN
10.0000 mg | INTRAMUSCULAR | Status: DC | PRN
  Administered 2016-12-07: 10 mg via INTRAVENOUS
  Filled 2016-12-07: qty 1

## 2016-12-07 MED ORDER — NALOXONE HCL 0.4 MG/ML IJ SOLN: 0.4000 mg | INTRAMUSCULAR | Status: DC | PRN

## 2016-12-07 MED ORDER — FENTANYL CITRATE (PF) 100 MCG/2ML IJ SOLN
INTRAMUSCULAR | Status: AC
  Filled 2016-12-07: qty 6

## 2016-12-07 MED ORDER — FENTANYL CITRATE (PF) 100 MCG/2ML IJ SOLN
INTRAMUSCULAR | Status: AC
  Filled 2016-12-07: qty 2

## 2016-12-07 MED ORDER — METOPROLOL TARTRATE 5 MG/5ML IV SOLN
5.0000 mg | Freq: Once | INTRAVENOUS | Status: AC
  Administered 2016-12-07: 5 mg via INTRAVENOUS
  Filled 2016-12-07: qty 5

## 2016-12-07 MED ORDER — IOPAMIDOL (ISOVUE-300) INJECTION 61%
INTRAVENOUS | Status: AC
  Filled 2016-12-07: qty 150

## 2016-12-07 MED ORDER — ONDANSETRON HCL 4 MG/2ML IJ SOLN: 4.0000 mg | Freq: Four times a day (QID) | INTRAMUSCULAR | Status: DC | PRN

## 2016-12-07 MED ORDER — FENTANYL CITRATE (PF) 100 MCG/2ML IJ SOLN
50.0000 ug | Freq: Once | INTRAMUSCULAR | Status: AC
  Administered 2016-12-07: 50 ug via INTRAVENOUS

## 2016-12-07 MED ORDER — LIDOCAINE HCL (PF) 1 % IJ SOLN
INTRAMUSCULAR | Status: AC
  Filled 2016-12-07: qty 30

## 2016-12-07 MED ORDER — IOPAMIDOL (ISOVUE-300) INJECTION 61%
INTRAVENOUS | Status: AC
  Administered 2016-12-07: 85 mL
  Filled 2016-12-07: qty 50

## 2016-12-07 MED ORDER — MIDAZOLAM HCL 2 MG/2ML IJ SOLN
INTRAMUSCULAR | Status: AC
  Filled 2016-12-07: qty 6

## 2016-12-07 NOTE — Sedation Documentation (Signed)
Dr. Pascal Lux is treating the left side of Shirley [name]'s fibroid. More medication given to mitigate pain.  Shirley [name] states her pain remains at a 5 our of 10.

## 2016-12-07 NOTE — Sedation Documentation (Signed)
Patient is resting comfortably. 

## 2016-12-07 NOTE — Sedation Documentation (Signed)
Pt states she is in pain.  Pt encouraged to use the PCA pump

## 2016-12-07 NOTE — Sedation Documentation (Signed)
Dr. Pascal Lux to begin treating the fibroid.  Medications given to mitigate pain.

## 2016-12-07 NOTE — Sedation Documentation (Signed)
Pt remains lying in a supine position.  Vital signs stable.  No signs of pain or distress noted.  Will continue to monitor.

## 2016-12-07 NOTE — Procedures (Signed)
Pre-procedure Diagnosis: Symptomatic uterine fibroids Post-procedure Diagnosis: Same  Post UFE.    Complications: None Immediate  EBL: None  Keep right leg straight for 4 hrs.    SignedSandi Mariscal Pager: [phone] 12/07/2016, 11:07 AM

## 2016-12-07 NOTE — Sedation Documentation (Signed)
Pt's BP and HR and elevated.  Dr. Pascal Lux made aware and BP medications ordered and given.

## 2016-12-07 NOTE — Sedation Documentation (Signed)
Pt continues to rate her pain a 5 out of 10.

## 2016-12-07 NOTE — Progress Notes (Signed)
   Subjective: No acute events overnight, pt underwent uterine artery embolization today, tolerated the procedure well but now with abdominal pain, managed by PCA. Also has had nausea after the procedure, one episode of emesis.    Objective:  Vital signs in last 24 hours: Vitals:   12/07/16 1216 12/07/16 1408 12/07/16 1557 12/07/16 1646  BP:  (!) 186/99    Pulse:  (!) 103    Resp: 16 16 20 19   Temp:  97.8 F (36.6 C)    TempSrc:  Oral    SpO2: 97% 100% 97% 97%  Weight:      Height:       Physical Exam  Constitutional: She is oriented to person, place, and time.  Laying flat in bed with [place]   HENT:  Head: Normocephalic and atraumatic.  Cardiovascular: Normal rate, regular rhythm and normal heart sounds.   Pulmonary/Chest: Effort normal and breath sounds normal. No respiratory distress.  Abdominal: Soft.  Tenderness to palpation primarily in RLQ   Musculoskeletal:  Tenderness to posterior R calf consistent with DVT hx   Neurological: She is alert and oriented to person, place, and time.  Skin: Skin is warm and dry.  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 No significant electrolyte abnormalities. She has tolerated PO intake at home and was able to quickly advance her diet. Her abdominal pain is now likely related to her procedure, see details below.    --Anti-emetics- IV Phenergan q6hr prn, PO Reglan TID, Zofran q6hr prn --Continue home PPI --Bowel regimen- miralax, senna --BMP daily     Vaginal bleeding, known uterine fundal fibroid Iron deficiency anemia  Known history of uterine fibroid with associated irregular uterine bleeding, imaged with pelvic, transvaginal US on admission. Ongoing vaginal bleeding since being on anti-coagulation for PE though  conflicting information regarding severity and response to megestrol. Hgb 10.0 on presentation, improved from prior suggesting bleeding has improved. Pt was also not taking oral Fe as outpatient, received IV Fe on recent admission. She underwent embolization of the fibroid. Current Hgb 9.6. --PCA with dilaudid for post-op pain control, bolus 0.3 mg, can increase hour 4 hr lockout dose to 2.5 mg if needed  --Continue Megace 80 mg BID --Continue oral Fe --CBC daily, continue to monitor    History of Bilateral PE, L LE DVT  Stable on recent imaging, on home Eliquis after switching from Xarelto. On further review it appears her DVT/PE may have been considered provoked with some small increased risk with Depo-provera, hospitalization 2 months prior. Normal AT3, beta 2 glycoprotein and anticardiolipin abs.    --Heparin gtt per pharmacy  --Antiphospholipid Ab labs, Factor V Leiden, Prothrombin gene mutation labs pending     Diabetes Mellitus, insulin dependent On home insulin 70/30 25 U BID, most recent A1c 8.6 in 08/2016. --Novolog 70/30 25 U BID, SSI    Dispo: Anticipated discharge in approximately 2-3 day(s).   Tawny Asal, MD 12/07/2016, 4:50 PM Pager: [phone]

## 2016-12-07 NOTE — Sedation Documentation (Signed)
Pt states she has a pain rating a 5 out of 10. Medication given.

## 2016-12-07 NOTE — Progress Notes (Addendum)
    Addendum:  Heparin not to restart until 12 hours post procedure. Plan to restart at [facility].  --------------------------------------------------  [facility] for heparin Indication: recent PE/DVT  Labs:  Recent Labs  12/05/16 0650  12/06/16 0631 12/06/16 1858 12/07/16 0517  HGB 9.2*  --  9.5*  --  9.6*  HCT 27.5*  --  28.2*  --  28.5*  PLT 405*  --  442*  --  403*  APTT  --   < > 49* 76* 76*  LABPROT  --   --   --   --  14.6  INR  --   --   --   --  1.14  HEPARINUNFRC  --   < > 0.92* 1.01* 0.94*  CREATININE 1.09*  --   --   --   --   < > = values in this interval not displayed.  Assessment: 41 yo F on Eliquis PTA for hx RLE DVT and bilateral PE.  Pt presented to ED with abdominal pain, nausea, and vaginal bleeding.  Pt has a hx of uterine fibroids and reports ongoing vaginal bleeding since the initiation of oral anticoagulation.  Now considering uterine fibroid embolization.  Pharmacy has been asked to transition patient from Eliquis to heparin infusion pending procedure planned for 8/16.    Of note, Eliquis can alter the accuracy of heparin levels and therefor dosing will be based on aPTTs until heparin levels and aPTTs correlate.   APTT remains therapeutic at 76 on heparin 1600 units/hr  Goal of Therapy:  Heparin level 0.3-0.7 units/ml aPTT 66-102 seconds Monitor platelets by anticoagulation protocol: Yes   Plan:  Continue heparin infusion at 1600 units/hr Heparin level, CBC, and aPTT daily.   Thank you for allowing [place] to participate in this patients care.  Jens Som, PharmD Clinical phone for 12/07/2016 from 7a-3:30p: [phone] If after 3:30p, please call main pharmacy at: [phone] 12/07/2016 10:57 AM

## 2016-12-07 NOTE — Sedation Documentation (Addendum)
Shirley [name] is holding pressure on the right groin area.

## 2016-12-08 LAB — CBC
HEMATOCRIT: 31.1 % — AB (ref 36.0–46.0)
HEMOGLOBIN: 10.5 g/dL — AB (ref 12.0–15.0)
MCH: 26.1 pg (ref 26.0–34.0)
MCHC: 33.8 g/dL (ref 30.0–36.0)
MCV: 77.2 fL — AB (ref 78.0–100.0)
Platelets: 426 10*3/uL — ABNORMAL HIGH (ref 150–400)
RBC: 4.03 MIL/uL (ref 3.87–5.11)
RDW: 20.5 % — AB (ref 11.5–15.5)
WBC: 12.5 10*3/uL — AB (ref 4.0–10.5)

## 2016-12-08 LAB — GLUCOSE, CAPILLARY
GLUCOSE-CAPILLARY: 152 mg/dL — AB (ref 65–99)
GLUCOSE-CAPILLARY: 101 mg/dL — AB (ref 65–99)
Glucose-Capillary: 58 mg/dL — ABNORMAL LOW (ref 65–99)
Glucose-Capillary: 141 mg/dL — ABNORMAL HIGH (ref 65–99)
Glucose-Capillary: 188 mg/dL — ABNORMAL HIGH (ref 65–99)

## 2016-12-08 LAB — HEPARIN LEVEL (UNFRACTIONATED)
HEPARIN UNFRACTIONATED: 0.62 [IU]/mL (ref 0.30–0.70)
HEPARIN UNFRACTIONATED: 1.01 [IU]/mL — AB (ref 0.30–0.70)

## 2016-12-08 LAB — BASIC METABOLIC PANEL
Anion gap: 11 (ref 5–15)
BUN: 12 mg/dL (ref 6–20)
CO2: 19 mmol/L — ABNORMAL LOW (ref 22–32)
Calcium: 9.3 mg/dL (ref 8.9–10.3)
Chloride: 106 mmol/L (ref 101–111)
Creatinine, Ser: 0.89 mg/dL (ref 0.44–1.00)
GFR calc Af Amer: >60 mL/min (ref >60)
GFR calc non Af Amer: >60 mL/min (ref >60)
Glucose, Bld: 127 mg/dL — ABNORMAL HIGH (ref 65–99)
Potassium: 3.9 mmol/L (ref 3.5–5.1)
Sodium: 136 mmol/L (ref 135–145)

## 2016-12-08 LAB — FACTOR 5 LEIDEN

## 2016-12-08 LAB — APTT
aPTT: 62 seconds — ABNORMAL HIGH (ref 24–36)
aPTT: 88 seconds — ABNORMAL HIGH (ref 24–36)

## 2016-12-08 IMAGING — CR DG ABDOMEN ACUTE W/ 1V CHEST
4 series · 4 of 4 positions shown · non-contrast
Comparison: Chest radiograph performed 04/23/2014

CLINICAL DATA: Acute onset of vomiting and lower abdominal pain for
24 hours. Initial encounter.

EXAM:
ACUTE ABDOMEN SERIES (ABDOMEN 2 VIEW & CHEST 1 VIEW)

[w chest pa]
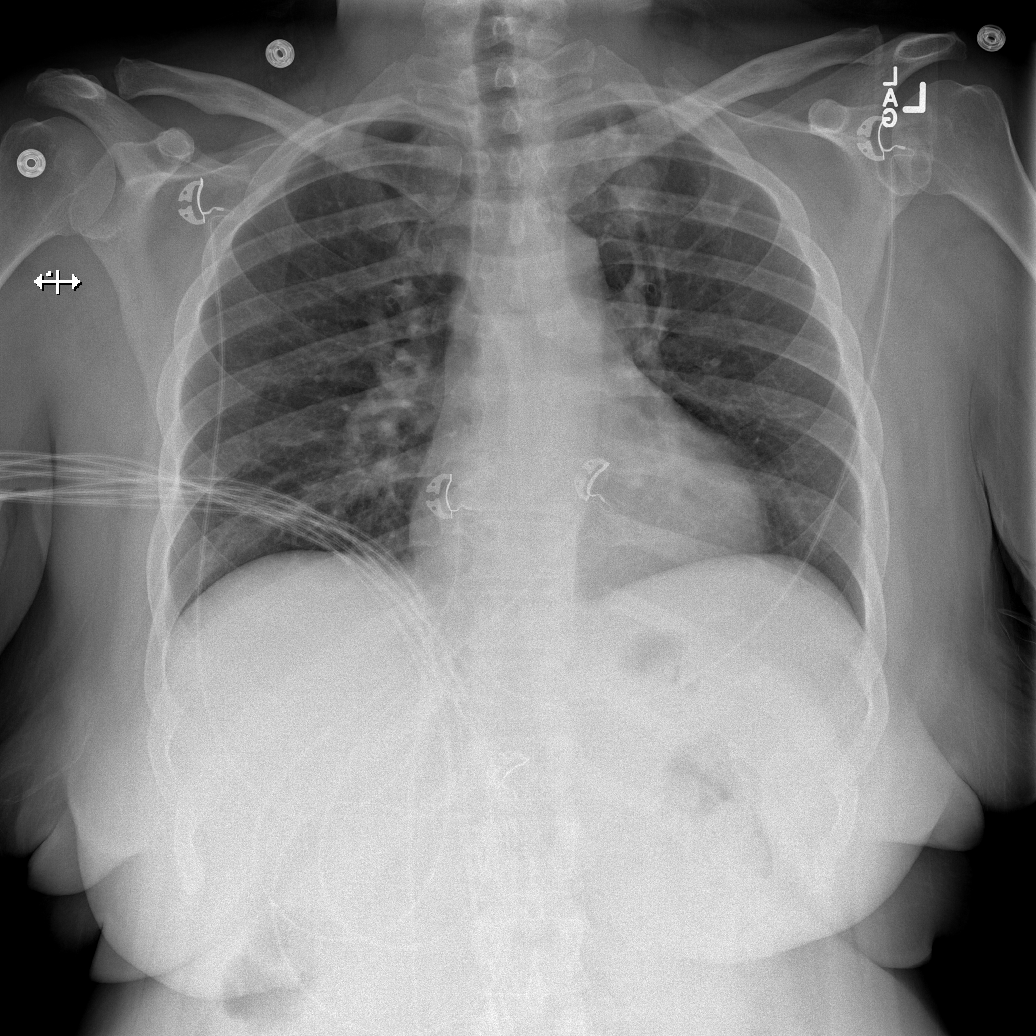

[w abdomen upright]
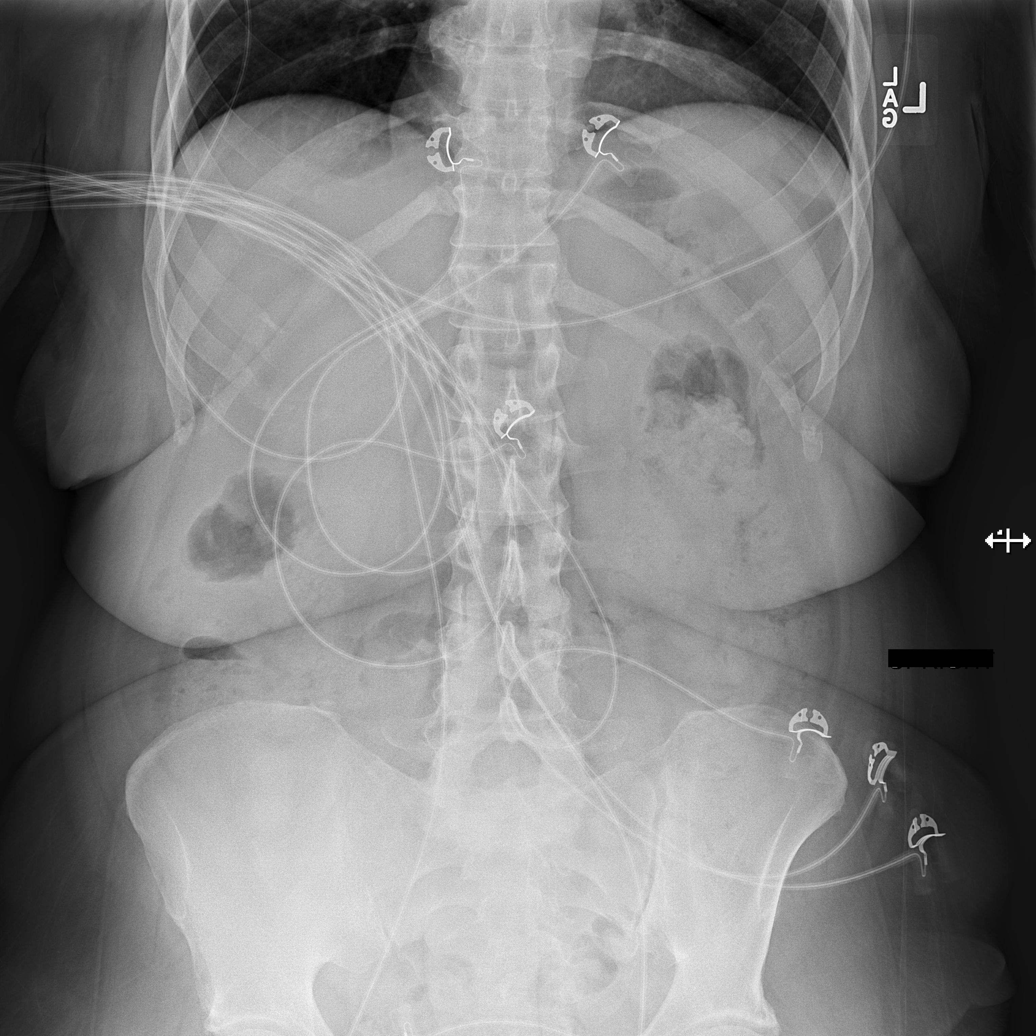

[t abdomen supine (1 of 2)]
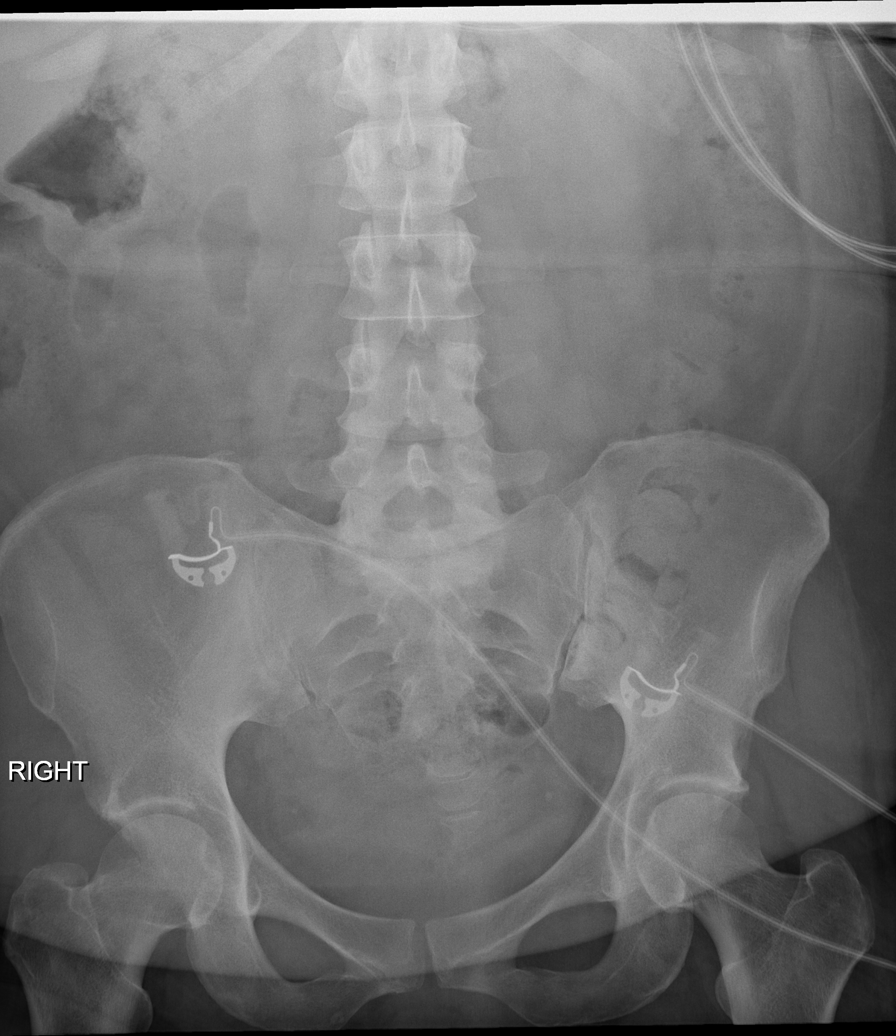

[t abdomen supine (2 of 2)]
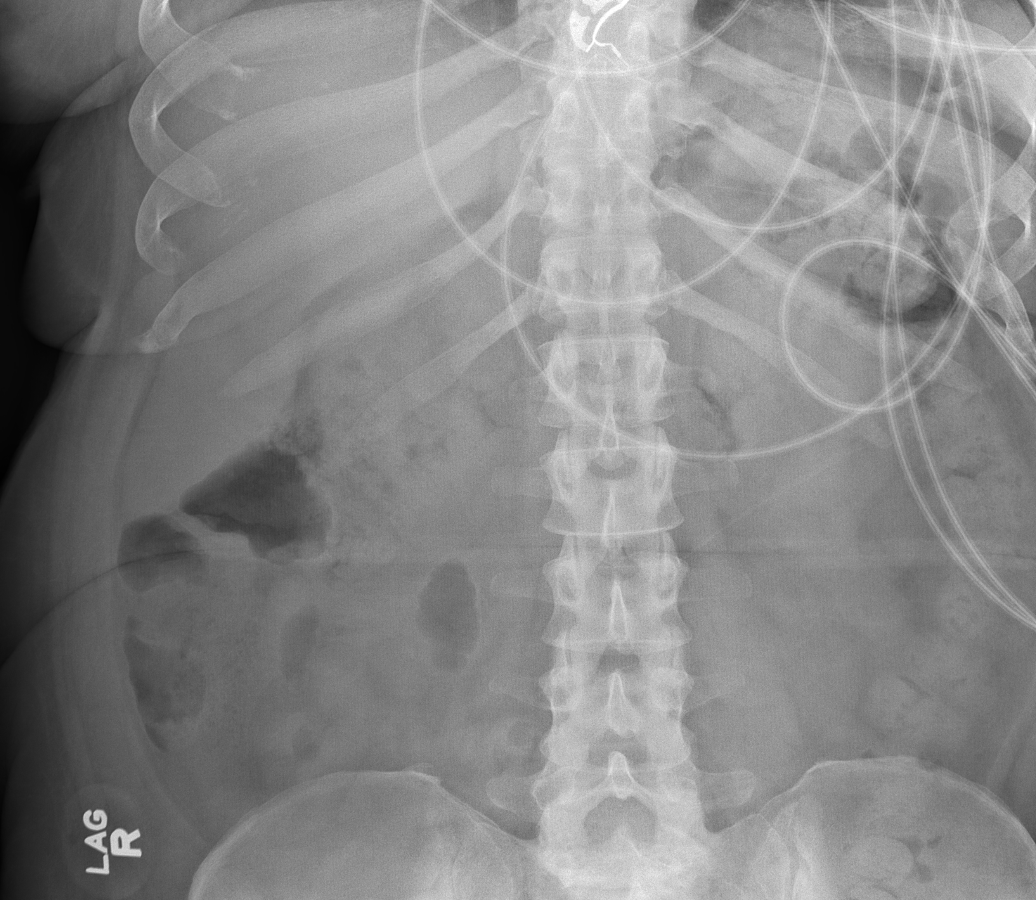

[4 of 4 positions shown; findings below may reference images not displayed]

FINDINGS: The lungs are well-aerated. Mild vascular congestion is noted. There
is no evidence of focal opacification, pleural effusion or
pneumothorax. The cardiomediastinal silhouette is within normal
limits.

The visualized bowel gas pattern is unremarkable. Scattered stool
and air are seen within the colon; there is no evidence of small
bowel dilatation to suggest obstruction. No free intra-abdominal air
is identified on the provided upright view.

No acute osseous abnormalities are seen; the sacroiliac joints are
unremarkable in appearance.
IMPRESSION: 1. Unremarkable bowel gas pattern; no free intra-abdominal air seen.
Small to moderate amount of stool noted in the colon.
2. Mild vascular congestion noted; lungs remain grossly clear.

## 2016-12-08 MED ORDER — OXYCODONE-ACETAMINOPHEN 5-325 MG PO TABS
1.0000 | ORAL_TABLET | ORAL | Status: DC | PRN
  Administered 2016-12-08: 1 via ORAL
  Administered 2016-12-09: 2 via ORAL
  Administered 2016-12-09: 1 via ORAL
  Filled 2016-12-08: qty 2
  Filled 2016-12-08 (×2): qty 1
  Filled 2016-12-08: qty 2

## 2016-12-08 MED ORDER — KETOROLAC TROMETHAMINE 30 MG/ML IJ SOLN
30.0000 mg | Freq: Four times a day (QID) | INTRAMUSCULAR | Status: DC | PRN
  Administered 2016-12-08 – 2016-12-10 (×4): 30 mg via INTRAVENOUS
  Filled 2016-12-08 (×4): qty 1

## 2016-12-08 NOTE — Progress Notes (Signed)
ANTICOAGULATION CONSULT NOTE - Follow Up Consult  Pharmacy Consult for Heparin (Apixaban on hold) Indication: Recent DVT/PE  Allergies  Allergen Reactions  . Lisinopril Nausea Only and Swelling  . Morphine And Related Itching and Swelling    Patient Measurements: Height: 5\' 7"  (170.2 cm) Weight: 212 lb 2 oz (96.2 kg) IBW/kg (Calculated) : 61.6  Vital Signs: Temp: 98.9 F (37.2 C) (08/17 0707) Temp Source: Oral (08/17 0707) BP: 141/86 (08/17 0707) Pulse Rate: 95 (08/17 0707)  Labs:  Recent Labs  12/06/16 0631 12/06/16 1858 12/07/16 0517 12/08/16 0552  HGB 9.5*  --  9.6* 10.5*  HCT 28.2*  --  28.5* 31.1*  PLT 442*  --  403* 426*  APTT 49* 76* 76* 62*  LABPROT  --   --  14.6  --   INR  --   --  1.14  --   HEPARINUNFRC 0.92* 1.01* 0.94* 0.62  CREATININE  --   --   --  0.89    Estimated Creatinine Clearance: 97 mL/min (by C-G formula based on SCr of 0.89 mg/dL).  Assessment: 41 y/o F on heparin while apixaban on hold, aPTT is sub-therapeutic this AM after re-start s/p IR procedure yesterday. Using aPTT to dose for now given apixaban influence on anti-Xa levels.   Goal of Therapy:  Heparin level 0.3-0.7 units/ml Monitor platelets by anticoagulation protocol: Yes   Plan:  -Inc heparin to 1700 units/hr -1500 aPTT/HL  Narda Bonds 12/08/2016,7:17 AM

## 2016-12-08 NOTE — Progress Notes (Signed)
   Subjective: Pt continues to have nausea with several episodes of emesis overnight. Her pain is still present but improved over the last 24 hours. She has not resumed PO intake due to nausea.    Objective:  Vital signs in last 24 hours: Vitals:   12/08/16 0629 12/08/16 0707 12/08/16 0900 12/08/16 1430  BP:  (!) 141/86  102/79  Pulse:  95  (!) 102  Resp: 14 17 13 16   Temp:  98.9 F (37.2 C)  98.5 F (36.9 C)  TempSrc:  Oral  Oral  SpO2:  99% 98% 98%  Weight:      Height:       Physical Exam  Constitutional: She is oriented to person, place, and time.  Resting in bed with [place]   HENT:  Head: Normocephalic and atraumatic.  Cardiovascular: Normal rate, regular rhythm and normal heart sounds.   Pulmonary/Chest: Effort normal and breath sounds normal. No respiratory distress.  Abdominal: Soft.  Tenderness to palpation primarily in RLQ   Neurological: She is alert and oriented to person, place, and time.  Skin: Skin is warm and dry.  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 No significant electrolyte abnormalities. She has tolerated PO intake at home and was able to quickly advance her diet. Her abdominal pain is now related to her procedure, see details below.    --Anti-emetics- IV Phenergan q6hr prn, PO Reglan TID, Zofran q6hr prn --Pt would like to self advance diet, carb modified diet-pt will order liquids vs solids according to appetite --Continue home PPI --Bowel regimen- miralax, senna --BMP daily     Vaginal bleeding, known uterine fundal fibroid Iron deficiency anemia  Known history of uterine fibroid with associated irregular uterine bleeding, imaged with pelvic, transvaginal US on admission. Ongoing vaginal bleeding since being on anti-coagulation for PE  though conflicting information regarding severity and response to megestrol. Hgb 10.0 on presentation, improved from prior suggesting bleeding has improved. Pt was also not taking oral Fe as outpatient, received IV Fe on recent admission. She underwent embolization of the fibroid to provide better control of bleeding. Current Hgb 10.5. --Transition to oral percocet, IV Toradol for pain control   --Continue Megace 80 mg BID --Continue oral Fe --CBC daily, continue to monitor    History of Bilateral PE, L LE DVT  Stable on recent imaging, on home Eliquis after switching from Xarelto. On further review it appears her DVT/PE may have been considered provoked with some small increased risk with Depo-provera, hospitalization 2 months prior. Negative hypercoagulability workup with normal AT3, beta 2 glycoprotein and anticardiolipin abs, factor V leiden, prothrombin mutation.    --Heparin gtt per pharmacy, transition to Eliquis when tolerating PO    Diabetes Mellitus, insulin dependent On home insulin 70/30 25 U BID, most recent A1c 8.6 in 08/2016. --Novolog 70/30 25 U BID, SSI    Dispo: Anticipated discharge in approximately 2-3 day(s).   Tawny Asal, MD 12/08/2016, 4:51 PM Pager: [phone]

## 2016-12-08 NOTE — Progress Notes (Signed)
Discontinued patient's PCA and wasted remainding 10 ml's with Rosalio Loud, RN.

## 2016-12-08 NOTE — Progress Notes (Signed)
Hypoglycemic Event  CBG: 58  Treatment: 15 GM carbohydrate snack  Symptoms: Shaky  Follow-up CBG: Time:1305 CBG Result: 101   Possible Reasons for Event: Inadequate meal intake due to nausea despite antiemetics    Sherren Kerns

## 2016-12-08 NOTE — Progress Notes (Addendum)
ANTICOAGULATION CONSULT NOTE - Follow Up Consult  Pharmacy Consult for heparin dosing Indication: pulmonary embolus  Allergies  Allergen Reactions  . Lisinopril Nausea Only and Swelling  . Morphine And Related Itching and Swelling    Patient Measurements: Height: 5\' 7"  (170.2 cm) Weight: 212 lb 2 oz (96.2 kg) IBW/kg (Calculated) : 61.6 Heparin Dosing Weight: 82.5 kg  Vital Signs: Temp: 98.5 F (36.9 C) (08/17 1430) Temp Source: Oral (08/17 1430) BP: 102/79 (08/17 1430) Pulse Rate: 102 (08/17 1430)  Labs:  Recent Labs  12/06/16 0631  12/07/16 0517 12/08/16 0552 12/08/16 1539  HGB 9.5*  --  9.6* 10.5*  --   HCT 28.2*  --  28.5* 31.1*  --   PLT 442*  --  403* 426*  --   APTT 49*  < > 76* 62* 88*  LABPROT  --   --  14.6  --   --   INR  --   --  1.14  --   --   HEPARINUNFRC 0.92*  < > 0.94* 0.62 1.01*  CREATININE  --   --   --  0.89  --   < > = values in this interval not displayed.  Estimated Creatinine Clearance: 97 mL/min (by C-G formula based on SCr of 0.89 mg/dL).   Medications:  Scheduled:  . acetaminophen  1,000 mg Oral Q8H  . aspirin EC  81 mg Oral Daily  . ferrous sulfate  325 mg Oral BID WC  . hydrochlorothiazide  25 mg Oral Daily  . insulin aspart  0-9 Units Subcutaneous TID WC  . insulin aspart protamine- aspart  25 Units Subcutaneous BID WC  . losartan  50 mg Oral Daily  . megestrol  80 mg Oral BID  . metoCLOPramide  10 mg Oral TID AC & HS  . metoprolol tartrate  50 mg Oral BID  . pantoprazole  80 mg Oral Daily  . polyethylene glycol  17 g Oral Daily  . pravastatin  20 mg Oral q1800  . senna  1 tablet Oral BID   Infusions:  . heparin 1,700 Units/hr (12/08/16 1344)    Assessment: [name] is 31 YOF with PMH of DVT and PE who presented to ED with vaginal bleeding reported by patient since initiation of apixaban. Patient transitioned from apixaban to heparin for procedure on 8/16. Heparin restarted 8/16 at 2330 (12 hr post procedure).  Monitoring aPTT in addition to anti Xa due to lab interaction of apixaban with anti Xa. Recent anti Xa level is supratherapeutic, but likely skewed at 1.01 and aPTT is therapeutic at 88 sec on heparin 1700 units/hr. Plan to continue this heparin rate since aPTT is the more reliable measurement at this time.  Goal of Therapy:  Heparin level 0.3-0.7 units/ml aPTT 66-102 seconds Monitor platelets by anticoagulation protocol: Yes   Plan:  Continue heparin infusion at 1700 units/hr Check anti-Xa level in daily while on heparin Continue to monitor H&H and platelets  Amy Dorszynski 12/08/2016,4:46 PM   I discussed / reviewed the pharmacy note by Ms. Dorszynski and I agree with her findings and plans as documented.  Next heparin level / aPTT with AM labs.  [employer], Pharm.D., BCPS Clinical Pharmacist 12/08/2016 5:28 PM

## 2016-12-08 NOTE — Progress Notes (Signed)
Patient ID: Shirley [name], female   DOB: Dec 04, 1975, 41 y.o.   MRN: [id number]    Referring Physician(s): Dr. Murriel Hopper  Supervising Physician: Corrie Mckusick  Patient Status: [facility] - In-pt  Chief Complaint: menorrhagia  Subjective: Patient with some nausea, taking phenergan for this.  Having some pain and using PCA for this.  Didn't eat much this morning.  Walking to the bathroom and back.  Unable to tell if there is any difference in her bleeding yet.  Allergies: Lisinopril and Morphine and related  Medications: Prior to Admission medications   Medication Sig Start Date End Date Taking? Authorizing Provider  apixaban (ELIQUIS) 5 MG TABS tablet Take 1 tablet (5 mg total) by mouth 2 (two) times daily. 11/18/16  Yes Rice, Resa Miner, MD  clindamycin (CLINDAGEL) 1 % gel Apply topically 2 (two) times daily. 09/27/16  Yes Clent Demark, PA-C  ferrous sulfate 325 (65 FE) MG tablet Take 1 tablet (325 mg total) by mouth 2 (two) times daily with a meal. 09/05/16  Yes Clent Demark, PA-C  glucose blood (FREESTYLE TEST STRIPS) test strip Use as instructed 09/05/16  Yes Clent Demark, PA-C  glucose monitoring kit (FREESTYLE) monitoring kit 1 each by Does not apply route 4 (four) times daily - after meals and at bedtime. 1 month Diabetic Testing Supplies for QAC-QHS accuchecks. 09/05/16  Yes Clent Demark, PA-C  hydrochlorothiazide (HYDRODIURIL) 25 MG tablet Take 1 tablet (25 mg total) by mouth daily. Take on tablet in the morning. Patient taking differently: Take 25 mg by mouth daily.  09/27/16  Yes Clent Demark, PA-C  HYDROcodone-acetaminophen (NORCO/VICODIN) 5-325 MG tablet Take 1 tablet by mouth every 4 (four) hours as needed. 11/29/16  Yes Upstill, Nehemiah Settle, PA-C  insulin NPH-regular Human (NOVOLIN 70/30) (70-30) 100 UNIT/ML injection 25 units in the morning and 25 units in the evening. Patient taking differently: Inject 25 Units into the skin 2 (two) times daily with a  meal.  09/27/16  Yes Clent Demark, PA-C  losartan (COZAAR) 50 MG tablet Take 1 tablet (50 mg total) by mouth daily. 09/27/16  Yes Clent Demark, PA-C  lovastatin (MEVACOR) 20 MG tablet Take 1 tablet (20 mg total) by mouth at bedtime. 09/27/16  Yes Clent Demark, PA-C  megestrol (MEGACE) 40 MG tablet Take 2 tablets (80 mg total) by mouth 2 (two) times daily. 11/20/16 12/20/16 Yes Lavonia Drafts, MD  metoCLOPramide (REGLAN) 10 MG tablet Take 1 tablet (10 mg total) by mouth 4 (four) times daily -  before meals and at bedtime. 09/27/16  Yes Clent Demark, PA-C  metoprolol tartrate (LOPRESSOR) 50 MG tablet Take 1 tablet (50 mg total) by mouth 2 (two) times daily. 09/27/16  Yes Clent Demark, PA-C  omeprazole (PRILOSEC) 40 MG capsule Take 1 capsule (40 mg total) by mouth daily. 09/27/16  Yes Clent Demark, PA-C  ondansetron [facility] ODT) 8 MG disintegrating tablet Take 1 tablet (8 mg total) by mouth every 8 (eight) hours as needed for nausea or vomiting. 11/05/16  Yes [name], MD  oxyCODONE-acetaminophen (PERCOCET/ROXICET) 5-325 MG tablet Take 1 tablet by mouth every 4 (four) hours as needed for severe pain. 11/29/16  Yes Charlann Lange, PA-C  promethazine (PHENERGAN) 25 MG tablet Take 1 tablet (25 mg total) by mouth every 8 (eight) hours as needed for nausea or vomiting. 11/18/16 12/02/16 Yes Rice, Resa Miner, MD  albuterol (PROVENTIL HFA;VENTOLIN HFA) 108 (90 Base) MCG/ACT inhaler Inhale 2 puffs into the  lungs every 4 (four) hours as needed for wheezing or shortness of breath. Patient not taking: Reported on 11/20/2016 09/27/16   Clent Demark, PA-C  aspirin EC 81 MG tablet Take 1 tablet (81 mg total) by mouth daily. Patient not taking: Reported on 11/11/2016 09/27/16   Clent Demark, PA-C  INSULIN SYRINGE .5CC/28G (INS SYRINGE/NEEDLE .5CC/28G) 28G X 1/2" 0.5 ML MISC Please provide 1 month supply 09/27/16   Clent Demark, PA-C  Lancets (FREESTYLE) lancets Use as  instructed 09/27/16   Clent Demark, PA-C  traMADol (ULTRAM) 50 MG tablet Take 1 tablet (50 mg total) by mouth every 6 (six) hours as needed. Patient not taking: Reported on 11/28/2016 11/25/16   Arnetha Massy, MD    Vital Signs: BP (!) 141/86 (BP Location: Right Arm)   Pulse 95   Temp 98.9 F (37.2 C) (Oral)   Resp 13   Ht _0  (1.702 m)   Wt 212 lb 2 oz (96.2 kg)   SpO2 98%   BMI 33.22 kg/m   Physical Exam: Abd: soft, tender as expected, but no guarding.  R CFA site is c/d/i.  No distention, +BS  Imaging: Mr Pelvis W Wo Contrast  Result Date: 12/05/2016 CLINICAL DATA:  Pelvic pain and vaginal bleeding. EXAM: MRI PELVIS WITHOUT AND WITH CONTRAST TECHNIQUE: Multiplanar multisequence MR imaging of the pelvis was performed both before and after administration of intravenous contrast. CONTRAST:  29m MULTIHANCE GADOBENATE DIMEGLUMINE 529 MG/ML IV SOLN COMPARISON:  Pelvic ultrasound 12/02/2016 and CT scan 11/29/2016 FINDINGS: Examination is quite limited due to patient motion. Patient would not or could not hold still for the examination. Urinary Tract: The bladder appears normal. No bladder lesions or asymmetric bladder wall thickening. No ureteral dilatation. Bowel:  The visualized small bowel and colon are grossly normal. Vascular/Lymphatic: The major vascular structures appear patent. Reproductive: As demonstrated on the prior imaging studies there is a dominant posterior uterine fibroids centered in the myometrium. This measures approximately 6.4 x 6.2 x 5.4 cm. There is some mass effect on the endometrium versus displaced anteriorly and to the left. The endometrium is normal in thickness. No endometrial lesions are identified. The junctional zone appears slightly thickened and slightly nodular but no definite findings for adenomyomatosis. The cervix appears normal. Both ovaries appear normal. Other:  No ascites.  No inguinal mass or adenopathy. Musculoskeletal: No significant bony  findings. IMPRESSION: 1. 6 cm uterine fibroid with mild mass effect on the endometrium. Otherwise the endometrium appears normal. 2. Normal ovaries. 3. No pelvic lymphadenopathy. Electronically Signed   By: PMarijo SanesM.D.   On: 12/05/2016 18:36   Ir Angiogram Pelvis Selective Or Supraselective  Result Date: 12/07/2016 INDICATION: Symptomatic uterine fibroids. Patient recently started on anti coagulation secondary DVT and pulmonary embolism with associated worsening of her menorrhagia. As such, patient presents now for urine fibroid embolization. EXAM: Uterine Fibroid Embolization MEDICATIONS: Toradol 30 mg IV; Ancef 2 gm IV. The antibiotic was administered within one hour of the procedure ANESTHESIA/SEDATION: Moderate (conscious) sedation was employed during this procedure. A total of Versed 3 mg and Fentanyl 200 mcg was administered intravenously. Moderate Sedation Time: 84 minutes. [name] level of consciousness and vital signs were monitored continuously by radiology nursing throughout the procedure under my direct supervision. CONTRAST:  80 cc Isovue 300 FLUOROSCOPY TIME:  16 minutes 36 seconds (15,638mGy) COMPLICATIONS: None immediate. PROCEDURE: Informed consent was obtained from the patient following explanation of the procedure, risks, benefits and alternatives. The  patient understands, agrees and consents for the procedure. All questions were addressed. A time out was performed prior to the initiation of the procedure. Maximal barrier sterile technique utilized including caps, mask, sterile gowns, sterile gloves, large sterile drape, hand hygiene, and Betadine prep. The right femoral head was marked fluoroscopically. Under sterile conditions and local anesthesia, the right common femoral artery access was performed with a micropuncture needle. Under direct ultrasound guidance, the right common femoral was accessed with a micropuncture kit. An ultrasound image was saved for documentation  purposes. This allowed for placement of a 5-French vascular sheath. A limited arteriogram was performed through the side arm of the sheath confirming appropriate access within the right common femoral artery. The 5-French C2 catheter was utilized to select the contralateral left internal iliac artery. Selective left internal iliac angiogram was performed. The tortuous left uterine artery was identified. Selective catheterization was performed of the left uterine artery with a microcatheter and micro guide wire. A selective left uterine angiogram was performed. This demonstrated patency of the left uterine artery. Mild diffuse hypervascularity of the enlarged fibroid uterus. Access was adequate for embolization. For embolization, 1 vial of 500 - 700 micron Embospheres were injected into the left uterine artery. Post embolization angiogram confirms complete stasis of the left uterine vascular territory. Microcatheter was removed. The C2 catheter was retracted and utilized to select the right internal iliac artery. Selective right internal iliac angiogram was performed. The patent right uterine artery was identified. For selective catheterization, the micro catheter and guidewire were utilized to select the right uterine artery. Selective right uterine angiogram was performed. This demonstrated patency of the right uterine artery. Catheter position was safe for embolization. Embolization was performed to complete stasis with injection of 0.5 vials of 500-700 micron Embospheres. Post embolization angiogram confirms complete stasis of the right uterine vascular territory. At this point, all wires, catheters and sheaths were removed from the patient. Hemostasis was achieved at the right groin access site with The patient tolerated the procedure well without immediate post procedural complication. FINDINGS: Bilateral internal iliac arteriograms demonstrates conventional branching pattern with widely patent origins of the  bilateral uterine arteries. Sub selective bilateral uterine arteriograms demonstrate co-dominant supply to a hypertrophied myomatous uterus. Completion arteriograms following bilateral uterine artery particle embolization demonstrates a technically excellent result with stasis of flow within the bilateral uterine vascular territories. IMPRESSION: Successful bilateral uterine artery embolization (U F E). PLAN: The patient will be seen in follow-up consultation at the interventional radiology clinic in approximately 3-4 weeks. Electronically Signed   By: Sandi Mariscal M.D.   On: 12/07/2016 12:53   Ir Angiogram Pelvis Selective Or Supraselective  Result Date: 12/07/2016 INDICATION: Symptomatic uterine fibroids. Patient recently started on anti coagulation secondary DVT and pulmonary embolism with associated worsening of her menorrhagia. As such, patient presents now for urine fibroid embolization. EXAM: Uterine Fibroid Embolization MEDICATIONS: Toradol 30 mg IV; Ancef 2 gm IV. The antibiotic was administered within one hour of the procedure ANESTHESIA/SEDATION: Moderate (conscious) sedation was employed during this procedure. A total of Versed 3 mg and Fentanyl 200 mcg was administered intravenously. Moderate Sedation Time: 84 minutes. [name] level of consciousness and vital signs were monitored continuously by radiology nursing throughout the procedure under my direct supervision. CONTRAST:  80 cc Isovue 300 FLUOROSCOPY TIME:  16 minutes 36 seconds (4,481 mGy) COMPLICATIONS: None immediate. PROCEDURE: Informed consent was obtained from the patient following explanation of the procedure, risks, benefits and alternatives. The patient understands, agrees and  consents for the procedure. All questions were addressed. A time out was performed prior to the initiation of the procedure. Maximal barrier sterile technique utilized including caps, mask, sterile gowns, sterile gloves, large sterile drape, hand hygiene,  and Betadine prep. The right femoral head was marked fluoroscopically. Under sterile conditions and local anesthesia, the right common femoral artery access was performed with a micropuncture needle. Under direct ultrasound guidance, the right common femoral was accessed with a micropuncture kit. An ultrasound image was saved for documentation purposes. This allowed for placement of a 5-French vascular sheath. A limited arteriogram was performed through the side arm of the sheath confirming appropriate access within the right common femoral artery. The 5-French C2 catheter was utilized to select the contralateral left internal iliac artery. Selective left internal iliac angiogram was performed. The tortuous left uterine artery was identified. Selective catheterization was performed of the left uterine artery with a microcatheter and micro guide wire. A selective left uterine angiogram was performed. This demonstrated patency of the left uterine artery. Mild diffuse hypervascularity of the enlarged fibroid uterus. Access was adequate for embolization. For embolization, 1 vial of 500 - 700 micron Embospheres were injected into the left uterine artery. Post embolization angiogram confirms complete stasis of the left uterine vascular territory. Microcatheter was removed. The C2 catheter was retracted and utilized to select the right internal iliac artery. Selective right internal iliac angiogram was performed. The patent right uterine artery was identified. For selective catheterization, the micro catheter and guidewire were utilized to select the right uterine artery. Selective right uterine angiogram was performed. This demonstrated patency of the right uterine artery. Catheter position was safe for embolization. Embolization was performed to complete stasis with injection of 0.5 vials of 500-700 micron Embospheres. Post embolization angiogram confirms complete stasis of the right uterine vascular territory. At this  point, all wires, catheters and sheaths were removed from the patient. Hemostasis was achieved at the right groin access site with The patient tolerated the procedure well without immediate post procedural complication. FINDINGS: Bilateral internal iliac arteriograms demonstrates conventional branching pattern with widely patent origins of the bilateral uterine arteries. Sub selective bilateral uterine arteriograms demonstrate co-dominant supply to a hypertrophied myomatous uterus. Completion arteriograms following bilateral uterine artery particle embolization demonstrates a technically excellent result with stasis of flow within the bilateral uterine vascular territories. IMPRESSION: Successful bilateral uterine artery embolization (U F E). PLAN: The patient will be seen in follow-up consultation at the interventional radiology clinic in approximately 3-4 weeks. Electronically Signed   By: Sandi Mariscal M.D.   On: 12/07/2016 12:53   Ir Angiogram Selective Each Additional Vessel  Result Date: 12/07/2016 INDICATION: Symptomatic uterine fibroids. Patient recently started on anti coagulation secondary DVT and pulmonary embolism with associated worsening of her menorrhagia. As such, patient presents now for urine fibroid embolization. EXAM: Uterine Fibroid Embolization MEDICATIONS: Toradol 30 mg IV; Ancef 2 gm IV. The antibiotic was administered within one hour of the procedure ANESTHESIA/SEDATION: Moderate (conscious) sedation was employed during this procedure. A total of Versed 3 mg and Fentanyl 200 mcg was administered intravenously. Moderate Sedation Time: 84 minutes. [name] level of consciousness and vital signs were monitored continuously by radiology nursing throughout the procedure under my direct supervision. CONTRAST:  80 cc Isovue 300 FLUOROSCOPY TIME:  16 minutes 36 seconds (2,202 mGy) COMPLICATIONS: None immediate. PROCEDURE: Informed consent was obtained from the patient following explanation of  the procedure, risks, benefits and alternatives. The patient understands, agrees and consents for the  procedure. All questions were addressed. A time out was performed prior to the initiation of the procedure. Maximal barrier sterile technique utilized including caps, mask, sterile gowns, sterile gloves, large sterile drape, hand hygiene, and Betadine prep. The right femoral head was marked fluoroscopically. Under sterile conditions and local anesthesia, the right common femoral artery access was performed with a micropuncture needle. Under direct ultrasound guidance, the right common femoral was accessed with a micropuncture kit. An ultrasound image was saved for documentation purposes. This allowed for placement of a 5-French vascular sheath. A limited arteriogram was performed through the side arm of the sheath confirming appropriate access within the right common femoral artery. The 5-French C2 catheter was utilized to select the contralateral left internal iliac artery. Selective left internal iliac angiogram was performed. The tortuous left uterine artery was identified. Selective catheterization was performed of the left uterine artery with a microcatheter and micro guide wire. A selective left uterine angiogram was performed. This demonstrated patency of the left uterine artery. Mild diffuse hypervascularity of the enlarged fibroid uterus. Access was adequate for embolization. For embolization, 1 vial of 500 - 700 micron Embospheres were injected into the left uterine artery. Post embolization angiogram confirms complete stasis of the left uterine vascular territory. Microcatheter was removed. The C2 catheter was retracted and utilized to select the right internal iliac artery. Selective right internal iliac angiogram was performed. The patent right uterine artery was identified. For selective catheterization, the micro catheter and guidewire were utilized to select the right uterine artery. Selective right  uterine angiogram was performed. This demonstrated patency of the right uterine artery. Catheter position was safe for embolization. Embolization was performed to complete stasis with injection of 0.5 vials of 500-700 micron Embospheres. Post embolization angiogram confirms complete stasis of the right uterine vascular territory. At this point, all wires, catheters and sheaths were removed from the patient. Hemostasis was achieved at the right groin access site with The patient tolerated the procedure well without immediate post procedural complication. FINDINGS: Bilateral internal iliac arteriograms demonstrates conventional branching pattern with widely patent origins of the bilateral uterine arteries. Sub selective bilateral uterine arteriograms demonstrate co-dominant supply to a hypertrophied myomatous uterus. Completion arteriograms following bilateral uterine artery particle embolization demonstrates a technically excellent result with stasis of flow within the bilateral uterine vascular territories. IMPRESSION: Successful bilateral uterine artery embolization (U F E). PLAN: The patient will be seen in follow-up consultation at the interventional radiology clinic in approximately 3-4 weeks. Electronically Signed   By: Sandi Mariscal M.D.   On: 12/07/2016 12:53   Ir Angiogram Selective Each Additional Vessel  Result Date: 12/07/2016 INDICATION: Symptomatic uterine fibroids. Patient recently started on anti coagulation secondary DVT and pulmonary embolism with associated worsening of her menorrhagia. As such, patient presents now for urine fibroid embolization. EXAM: Uterine Fibroid Embolization MEDICATIONS: Toradol 30 mg IV; Ancef 2 gm IV. The antibiotic was administered within one hour of the procedure ANESTHESIA/SEDATION: Moderate (conscious) sedation was employed during this procedure. A total of Versed 3 mg and Fentanyl 200 mcg was administered intravenously. Moderate Sedation Time: 84 minutes. The  patient's level of consciousness and vital signs were monitored continuously by radiology nursing throughout the procedure under my direct supervision. CONTRAST:  80 cc Isovue 300 FLUOROSCOPY TIME:  16 minutes 36 seconds (3,664 mGy) COMPLICATIONS: None immediate. PROCEDURE: Informed consent was obtained from the patient following explanation of the procedure, risks, benefits and alternatives. The patient understands, agrees and consents for the procedure. All questions were  addressed. A time out was performed prior to the initiation of the procedure. Maximal barrier sterile technique utilized including caps, mask, sterile gowns, sterile gloves, large sterile drape, hand hygiene, and Betadine prep. The right femoral head was marked fluoroscopically. Under sterile conditions and local anesthesia, the right common femoral artery access was performed with a micropuncture needle. Under direct ultrasound guidance, the right common femoral was accessed with a micropuncture kit. An ultrasound image was saved for documentation purposes. This allowed for placement of a 5-French vascular sheath. A limited arteriogram was performed through the side arm of the sheath confirming appropriate access within the right common femoral artery. The 5-French C2 catheter was utilized to select the contralateral left internal iliac artery. Selective left internal iliac angiogram was performed. The tortuous left uterine artery was identified. Selective catheterization was performed of the left uterine artery with a microcatheter and micro guide wire. A selective left uterine angiogram was performed. This demonstrated patency of the left uterine artery. Mild diffuse hypervascularity of the enlarged fibroid uterus. Access was adequate for embolization. For embolization, 1 vial of 500 - 700 micron Embospheres were injected into the left uterine artery. Post embolization angiogram confirms complete stasis of the left uterine vascular territory.  Microcatheter was removed. The C2 catheter was retracted and utilized to select the right internal iliac artery. Selective right internal iliac angiogram was performed. The patent right uterine artery was identified. For selective catheterization, the micro catheter and guidewire were utilized to select the right uterine artery. Selective right uterine angiogram was performed. This demonstrated patency of the right uterine artery. Catheter position was safe for embolization. Embolization was performed to complete stasis with injection of 0.5 vials of 500-700 micron Embospheres. Post embolization angiogram confirms complete stasis of the right uterine vascular territory. At this point, all wires, catheters and sheaths were removed from the patient. Hemostasis was achieved at the right groin access site with The patient tolerated the procedure well without immediate post procedural complication. FINDINGS: Bilateral internal iliac arteriograms demonstrates conventional branching pattern with widely patent origins of the bilateral uterine arteries. Sub selective bilateral uterine arteriograms demonstrate co-dominant supply to a hypertrophied myomatous uterus. Completion arteriograms following bilateral uterine artery particle embolization demonstrates a technically excellent result with stasis of flow within the bilateral uterine vascular territories. IMPRESSION: Successful bilateral uterine artery embolization (U F E). PLAN: The patient will be seen in follow-up consultation at the interventional radiology clinic in approximately 3-4 weeks. Electronically Signed   By: Sandi Mariscal M.D.   On: 12/07/2016 12:53   Ir US Guide Vasc Access Right  Result Date: 12/07/2016 INDICATION: Symptomatic uterine fibroids. Patient recently started on anti coagulation secondary DVT and pulmonary embolism with associated worsening of her menorrhagia. As such, patient presents now for urine fibroid embolization. EXAM: Uterine Fibroid  Embolization MEDICATIONS: Toradol 30 mg IV; Ancef 2 gm IV. The antibiotic was administered within one hour of the procedure ANESTHESIA/SEDATION: Moderate (conscious) sedation was employed during this procedure. A total of Versed 3 mg and Fentanyl 200 mcg was administered intravenously. Moderate Sedation Time: 84 minutes. [name] level of consciousness and vital signs were monitored continuously by radiology nursing throughout the procedure under my direct supervision. CONTRAST:  80 cc Isovue 300 FLUOROSCOPY TIME:  16 minutes 36 seconds (1,610 mGy) COMPLICATIONS: None immediate. PROCEDURE: Informed consent was obtained from the patient following explanation of the procedure, risks, benefits and alternatives. The patient understands, agrees and consents for the procedure. All questions were addressed. A time out  was performed prior to the initiation of the procedure. Maximal barrier sterile technique utilized including caps, mask, sterile gowns, sterile gloves, large sterile drape, hand hygiene, and Betadine prep. The right femoral head was marked fluoroscopically. Under sterile conditions and local anesthesia, the right common femoral artery access was performed with a micropuncture needle. Under direct ultrasound guidance, the right common femoral was accessed with a micropuncture kit. An ultrasound image was saved for documentation purposes. This allowed for placement of a 5-French vascular sheath. A limited arteriogram was performed through the side arm of the sheath confirming appropriate access within the right common femoral artery. The 5-French C2 catheter was utilized to select the contralateral left internal iliac artery. Selective left internal iliac angiogram was performed. The tortuous left uterine artery was identified. Selective catheterization was performed of the left uterine artery with a microcatheter and micro guide wire. A selective left uterine angiogram was performed. This demonstrated  patency of the left uterine artery. Mild diffuse hypervascularity of the enlarged fibroid uterus. Access was adequate for embolization. For embolization, 1 vial of 500 - 700 micron Embospheres were injected into the left uterine artery. Post embolization angiogram confirms complete stasis of the left uterine vascular territory. Microcatheter was removed. The C2 catheter was retracted and utilized to select the right internal iliac artery. Selective right internal iliac angiogram was performed. The patent right uterine artery was identified. For selective catheterization, the micro catheter and guidewire were utilized to select the right uterine artery. Selective right uterine angiogram was performed. This demonstrated patency of the right uterine artery. Catheter position was safe for embolization. Embolization was performed to complete stasis with injection of 0.5 vials of 500-700 micron Embospheres. Post embolization angiogram confirms complete stasis of the right uterine vascular territory. At this point, all wires, catheters and sheaths were removed from the patient. Hemostasis was achieved at the right groin access site with The patient tolerated the procedure well without immediate post procedural complication. FINDINGS: Bilateral internal iliac arteriograms demonstrates conventional branching pattern with widely patent origins of the bilateral uterine arteries. Sub selective bilateral uterine arteriograms demonstrate co-dominant supply to a hypertrophied myomatous uterus. Completion arteriograms following bilateral uterine artery particle embolization demonstrates a technically excellent result with stasis of flow within the bilateral uterine vascular territories. IMPRESSION: Successful bilateral uterine artery embolization (U F E). PLAN: The patient will be seen in follow-up consultation at the interventional radiology clinic in approximately 3-4 weeks. Electronically Signed   By: Sandi Mariscal M.D.   On:  12/07/2016 12:53   Ir Embo Arterial Not Dovray Guide Roadmapping  Result Date: 12/07/2016 INDICATION: Symptomatic uterine fibroids. Patient recently started on anti coagulation secondary DVT and pulmonary embolism with associated worsening of her menorrhagia. As such, patient presents now for urine fibroid embolization. EXAM: Uterine Fibroid Embolization MEDICATIONS: Toradol 30 mg IV; Ancef 2 gm IV. The antibiotic was administered within one hour of the procedure ANESTHESIA/SEDATION: Moderate (conscious) sedation was employed during this procedure. A total of Versed 3 mg and Fentanyl 200 mcg was administered intravenously. Moderate Sedation Time: 84 minutes. [name] level of consciousness and vital signs were monitored continuously by radiology nursing throughout the procedure under my direct supervision. CONTRAST:  80 cc Isovue 300 FLUOROSCOPY TIME:  16 minutes 36 seconds (8,250 mGy) COMPLICATIONS: None immediate. PROCEDURE: Informed consent was obtained from the patient following explanation of the procedure, risks, benefits and alternatives. The patient understands, agrees and consents for the procedure. All questions were addressed. A time out was  performed prior to the initiation of the procedure. Maximal barrier sterile technique utilized including caps, mask, sterile gowns, sterile gloves, large sterile drape, hand hygiene, and Betadine prep. The right femoral head was marked fluoroscopically. Under sterile conditions and local anesthesia, the right common femoral artery access was performed with a micropuncture needle. Under direct ultrasound guidance, the right common femoral was accessed with a micropuncture kit. An ultrasound image was saved for documentation purposes. This allowed for placement of a 5-French vascular sheath. A limited arteriogram was performed through the side arm of the sheath confirming appropriate access within the right common femoral artery. The 5-French C2  catheter was utilized to select the contralateral left internal iliac artery. Selective left internal iliac angiogram was performed. The tortuous left uterine artery was identified. Selective catheterization was performed of the left uterine artery with a microcatheter and micro guide wire. A selective left uterine angiogram was performed. This demonstrated patency of the left uterine artery. Mild diffuse hypervascularity of the enlarged fibroid uterus. Access was adequate for embolization. For embolization, 1 vial of 500 - 700 micron Embospheres were injected into the left uterine artery. Post embolization angiogram confirms complete stasis of the left uterine vascular territory. Microcatheter was removed. The C2 catheter was retracted and utilized to select the right internal iliac artery. Selective right internal iliac angiogram was performed. The patent right uterine artery was identified. For selective catheterization, the micro catheter and guidewire were utilized to select the right uterine artery. Selective right uterine angiogram was performed. This demonstrated patency of the right uterine artery. Catheter position was safe for embolization. Embolization was performed to complete stasis with injection of 0.5 vials of 500-700 micron Embospheres. Post embolization angiogram confirms complete stasis of the right uterine vascular territory. At this point, all wires, catheters and sheaths were removed from the patient. Hemostasis was achieved at the right groin access site with The patient tolerated the procedure well without immediate post procedural complication. FINDINGS: Bilateral internal iliac arteriograms demonstrates conventional branching pattern with widely patent origins of the bilateral uterine arteries. Sub selective bilateral uterine arteriograms demonstrate co-dominant supply to a hypertrophied myomatous uterus. Completion arteriograms following bilateral uterine artery particle embolization  demonstrates a technically excellent result with stasis of flow within the bilateral uterine vascular territories. IMPRESSION: Successful bilateral uterine artery embolization (U F E). PLAN: The patient will be seen in follow-up consultation at the interventional radiology clinic in approximately 3-4 weeks. Electronically Signed   By: Sandi Mariscal M.D.   On: 12/07/2016 12:53    Labs:  CBC:  Recent Labs  12/05/16 0650 12/06/16 0631 12/07/16 0517 12/08/16 0552  WBC 8.0 11.3* 11.1* 12.5*  HGB 9.2* 9.5* 9.6* 10.5*  HCT 27.5* 28.2* 28.5* 31.1*  PLT 405* 442* 403* 426*    COAGS:  Recent Labs  01/09/16 1239  12/06/16 0631 12/06/16 1858 12/07/16 0517 12/08/16 0552  INR 1.10  --   --   --  1.14  --   APTT 24  < > 49* 76* 76* 62*  < > = values in this interval not displayed.  BMP:  Recent Labs  12/03/16 0514 12/04/16 0254 12/05/16 0650 12/08/16 0552  NA 136 136 135 136  K 3.5 3.6 4.1 3.9  CL 108 108 106 106  CO2 21* 23 25 19*  GLUCOSE 149* 304* 231* 127*  BUN _0 CALCIUM 8.4* 8.4* 8.3* 9.3  CREATININE 0.89 1.16* 1.09* 0.89  GFRNONAA >60 57* >60 >60  GFRAA >60 >60 >  60 >60    LIVER FUNCTION TESTS:  Recent Labs  11/11/16 1608 11/25/16 1423 11/28/16 2103 12/02/16 0312  BILITOT 1.3* 1.2 0.7 0.5  AST 34 _0 ALT 6* 9* 10* 9*  ALKPHOS 47 51 43 50  PROT 6.8 7.8 7.1 7.8  ALBUMIN 3.1* 3.3* 3.1* 3.4*    Assessment and Plan: 1. S/p UFE secondary to menorrhagia from anticoagulation in the setting of DVT/PE 2. Diabetic gastroparesis with associated chronic nausea  Cont phenergan for nausea as this may be acute on chronic. Will work on DC PCA and switch to oral pain meds.  She does have gastroparesis and this may worsen that; however, doubt tramadol is going to be effective enough for the patient at this time.  This is also only a temporary thing.  We generally give our UFE patients scheduled ibuprofen for at least the first several days.  I have discussed  with the primary service given her anti-coagulants, if they felt this was safe.  They are discussing and will let us know.  This will help significantly in pain control to minimize narcotic use as well.  Will follow for now.  Electronically Signed: Henreitta Cea 12/08/2016, 10:50 AM   I spent a total of 15 Minutes at the the patient's bedside AND on the patient's hospital floor or unit, greater than 50% of which was counseling/coordinating care for menorrhagia

## 2016-12-09 LAB — GLUCOSE, CAPILLARY
GLUCOSE-CAPILLARY: 283 mg/dL — AB (ref 65–99)
Glucose-Capillary: 129 mg/dL — ABNORMAL HIGH (ref 65–99)
Glucose-Capillary: 78 mg/dL (ref 65–99)
Glucose-Capillary: 204 mg/dL — ABNORMAL HIGH (ref 65–99)

## 2016-12-09 LAB — BASIC METABOLIC PANEL
ANION GAP: 9 (ref 5–15)
BUN: 17 mg/dL (ref 6–20)
CALCIUM: 9.1 mg/dL (ref 8.9–10.3)
CO2: 19 mmol/L — AB (ref 22–32)
Chloride: 108 mmol/L (ref 101–111)
Creatinine, Ser: 1.33 mg/dL — ABNORMAL HIGH (ref 0.44–1.00)
GFR, EST AFRICAN AMERICAN: 56 mL/min — AB (ref >60)
GFR, EST NON AFRICAN AMERICAN: 48 mL/min — AB (ref >60)
Glucose, Bld: 100 mg/dL — ABNORMAL HIGH (ref 65–99)
POTASSIUM: 3.9 mmol/L (ref 3.5–5.1)
Sodium: 136 mmol/L (ref 135–145)

## 2016-12-09 LAB — CBC
HEMATOCRIT: 29.6 % — AB (ref 36.0–46.0)
HEMOGLOBIN: 10 g/dL — AB (ref 12.0–15.0)
MCH: 26.5 pg (ref 26.0–34.0)
MCHC: 33.8 g/dL (ref 30.0–36.0)
MCV: 78.3 fL (ref 78.0–100.0)
Platelets: 381 10*3/uL (ref 150–400)
RBC: 3.78 MIL/uL — AB (ref 3.87–5.11)
RDW: 20.6 % — ABNORMAL HIGH (ref 11.5–15.5)
WBC: 12.9 10*3/uL — AB (ref 4.0–10.5)

## 2016-12-09 LAB — HEPARIN LEVEL (UNFRACTIONATED): HEPARIN UNFRACTIONATED: 1.06 [IU]/mL — AB (ref 0.30–0.70)

## 2016-12-09 LAB — APTT: APTT: 83 s — AB (ref 24–36)

## 2016-12-09 MED ORDER — APIXABAN 5 MG PO TABS
5.0000 mg | ORAL_TABLET | Freq: Two times a day (BID) | ORAL | Status: DC
Start: 2016-12-09 — End: 2016-12-11
  Administered 2016-12-09 – 2016-12-11 (×5): 5 mg via ORAL
  Filled 2016-12-09 (×5): qty 1

## 2016-12-09 MED ORDER — OXYCODONE HCL 5 MG PO TABS
10.0000 mg | ORAL_TABLET | ORAL | Status: AC | PRN
  Administered 2016-12-09: 10 mg via ORAL
  Filled 2016-12-09: qty 2

## 2016-12-09 MED ORDER — OXYCODONE HCL 5 MG PO TABS
5.0000 mg | ORAL_TABLET | Freq: Once | ORAL | Status: AC | PRN
  Administered 2016-12-09: 5 mg via ORAL
  Filled 2016-12-09: qty 1

## 2016-12-09 MED ORDER — OXYCODONE HCL 5 MG PO TABS: 5.0000 mg | ORAL_TABLET | ORAL | Status: DC | PRN

## 2016-12-09 NOTE — Progress Notes (Signed)
Patient ID: Shirley [name], female   DOB: 1976-02-17, 41 y.o.   MRN: [id number]    Referring Physician(s): Dr. Murriel Hopper  Supervising Physician: Corrie Mckusick  Patient Status: [facility] - In-pt  Chief Complaint: menorrhagia  Subjective: Patient states she spit up some blood today.  She is still having vaginal bleeding that is filling up her pad.  She can't tell if there is any difference yet.  She is still nauseated and not eating much.  Allergies: Lisinopril and Morphine and related  Medications: Prior to Admission medications   Medication Sig Start Date End Date Taking? Authorizing Provider  apixaban (ELIQUIS) 5 MG TABS tablet Take 1 tablet (5 mg total) by mouth 2 (two) times daily. 11/18/16  Yes Rice, Resa Miner, MD  clindamycin (CLINDAGEL) 1 % gel Apply topically 2 (two) times daily. 09/27/16  Yes Clent Demark, PA-C  ferrous sulfate 325 (65 FE) MG tablet Take 1 tablet (325 mg total) by mouth 2 (two) times daily with a meal. 09/05/16  Yes Clent Demark, PA-C  glucose blood (FREESTYLE TEST STRIPS) test strip Use as instructed 09/05/16  Yes Clent Demark, PA-C  glucose monitoring kit (FREESTYLE) monitoring kit 1 each by Does not apply route 4 (four) times daily - after meals and at bedtime. 1 month Diabetic Testing Supplies for QAC-QHS accuchecks. 09/05/16  Yes Clent Demark, PA-C  hydrochlorothiazide (HYDRODIURIL) 25 MG tablet Take 1 tablet (25 mg total) by mouth daily. Take on tablet in the morning. Patient taking differently: Take 25 mg by mouth daily.  09/27/16  Yes Clent Demark, PA-C  HYDROcodone-acetaminophen (NORCO/VICODIN) 5-325 MG tablet Take 1 tablet by mouth every 4 (four) hours as needed. 11/29/16  Yes Upstill, Nehemiah Settle, PA-C  insulin NPH-regular Human (NOVOLIN 70/30) (70-30) 100 UNIT/ML injection 25 units in the morning and 25 units in the evening. Patient taking differently: Inject 25 Units into the skin 2 (two) times daily with a meal.  09/27/16  Yes  Clent Demark, PA-C  losartan (COZAAR) 50 MG tablet Take 1 tablet (50 mg total) by mouth daily. 09/27/16  Yes Clent Demark, PA-C  lovastatin (MEVACOR) 20 MG tablet Take 1 tablet (20 mg total) by mouth at bedtime. 09/27/16  Yes Clent Demark, PA-C  megestrol (MEGACE) 40 MG tablet Take 2 tablets (80 mg total) by mouth 2 (two) times daily. 11/20/16 12/20/16 Yes Lavonia Drafts, MD  metoCLOPramide (REGLAN) 10 MG tablet Take 1 tablet (10 mg total) by mouth 4 (four) times daily -  before meals and at bedtime. 09/27/16  Yes Clent Demark, PA-C  metoprolol tartrate (LOPRESSOR) 50 MG tablet Take 1 tablet (50 mg total) by mouth 2 (two) times daily. 09/27/16  Yes Clent Demark, PA-C  omeprazole (PRILOSEC) 40 MG capsule Take 1 capsule (40 mg total) by mouth daily. 09/27/16  Yes Clent Demark, PA-C  ondansetron [facility] ODT) 8 MG disintegrating tablet Take 1 tablet (8 mg total) by mouth every 8 (eight) hours as needed for nausea or vomiting. 11/05/16  Yes [name], MD  oxyCODONE-acetaminophen (PERCOCET/ROXICET) 5-325 MG tablet Take 1 tablet by mouth every 4 (four) hours as needed for severe pain. 11/29/16  Yes Charlann Lange, PA-C  promethazine (PHENERGAN) 25 MG tablet Take 1 tablet (25 mg total) by mouth every 8 (eight) hours as needed for nausea or vomiting. 11/18/16 12/02/16 Yes Rice, Resa Miner, MD  albuterol (PROVENTIL HFA;VENTOLIN HFA) 108 (90 Base) MCG/ACT inhaler Inhale 2 puffs into the lungs every 4 (  four) hours as needed for wheezing or shortness of breath. Patient not taking: Reported on 11/20/2016 09/27/16   Clent Demark, PA-C  aspirin EC 81 MG tablet Take 1 tablet (81 mg total) by mouth daily. Patient not taking: Reported on 11/11/2016 09/27/16   Clent Demark, PA-C  INSULIN SYRINGE .5CC/28G (INS SYRINGE/NEEDLE .5CC/28G) 28G X 1/2" 0.5 ML MISC Please provide 1 month supply 09/27/16   Clent Demark, PA-C  Lancets (FREESTYLE) lancets Use as instructed 09/27/16   Clent Demark, PA-C  traMADol (ULTRAM) 50 MG tablet Take 1 tablet (50 mg total) by mouth every 6 (six) hours as needed. Patient not taking: Reported on 11/28/2016 11/25/16   Arnetha Massy, MD    Vital Signs: BP (!) 153/77 (BP Location: Right Arm)   Pulse (!) 106   Temp 99.2 F (37.3 C) (Oral)   Resp 17   Ht 5' 7"  (1.702 m)   Wt 212 lb 2 oz (96.2 kg)   SpO2 100%   BMI 33.22 kg/m   Physical Exam: Abd: soft, mildly tender over pelvis.  R CFA site is c/d/i with a bandaid present.  Imaging: Mr Pelvis W Wo Contrast  Result Date: 12/05/2016 CLINICAL DATA:  Pelvic pain and vaginal bleeding. EXAM: MRI PELVIS WITHOUT AND WITH CONTRAST TECHNIQUE: Multiplanar multisequence MR imaging of the pelvis was performed both before and after administration of intravenous contrast. CONTRAST:  34m MULTIHANCE GADOBENATE DIMEGLUMINE 529 MG/ML IV SOLN COMPARISON:  Pelvic ultrasound 12/02/2016 and CT scan 11/29/2016 FINDINGS: Examination is quite limited due to patient motion. Patient would not or could not hold still for the examination. Urinary Tract: The bladder appears normal. No bladder lesions or asymmetric bladder wall thickening. No ureteral dilatation. Bowel:  The visualized small bowel and colon are grossly normal. Vascular/Lymphatic: The major vascular structures appear patent. Reproductive: As demonstrated on the prior imaging studies there is a dominant posterior uterine fibroids centered in the myometrium. This measures approximately 6.4 x 6.2 x 5.4 cm. There is some mass effect on the endometrium versus displaced anteriorly and to the left. The endometrium is normal in thickness. No endometrial lesions are identified. The junctional zone appears slightly thickened and slightly nodular but no definite findings for adenomyomatosis. The cervix appears normal. Both ovaries appear normal. Other:  No ascites.  No inguinal mass or adenopathy. Musculoskeletal: No significant bony findings. IMPRESSION: 1. 6 cm  uterine fibroid with mild mass effect on the endometrium. Otherwise the endometrium appears normal. 2. Normal ovaries. 3. No pelvic lymphadenopathy. Electronically Signed   By: PMarijo SanesM.D.   On: 12/05/2016 18:36   Ir Angiogram Pelvis Selective Or Supraselective  Result Date: 12/07/2016 INDICATION: Symptomatic uterine fibroids. Patient recently started on anti coagulation secondary DVT and pulmonary embolism with associated worsening of her menorrhagia. As such, patient presents now for urine fibroid embolization. EXAM: Uterine Fibroid Embolization MEDICATIONS: Toradol 30 mg IV; Ancef 2 gm IV. The antibiotic was administered within one hour of the procedure ANESTHESIA/SEDATION: Moderate (conscious) sedation was employed during this procedure. A total of Versed 3 mg and Fentanyl 200 mcg was administered intravenously. Moderate Sedation Time: 84 minutes. [name] level of consciousness and vital signs were monitored continuously by radiology nursing throughout the procedure under my direct supervision. CONTRAST:  80 cc Isovue 300 FLUOROSCOPY TIME:  16 minutes 36 seconds (13,354mGy) COMPLICATIONS: None immediate. PROCEDURE: Informed consent was obtained from the patient following explanation of the procedure, risks, benefits and alternatives. The patient understands, agrees and  consents for the procedure. All questions were addressed. A time out was performed prior to the initiation of the procedure. Maximal barrier sterile technique utilized including caps, mask, sterile gowns, sterile gloves, large sterile drape, hand hygiene, and Betadine prep. The right femoral head was marked fluoroscopically. Under sterile conditions and local anesthesia, the right common femoral artery access was performed with a micropuncture needle. Under direct ultrasound guidance, the right common femoral was accessed with a micropuncture kit. An ultrasound image was saved for documentation purposes. This allowed for placement  of a 5-French vascular sheath. A limited arteriogram was performed through the side arm of the sheath confirming appropriate access within the right common femoral artery. The 5-French C2 catheter was utilized to select the contralateral left internal iliac artery. Selective left internal iliac angiogram was performed. The tortuous left uterine artery was identified. Selective catheterization was performed of the left uterine artery with a microcatheter and micro guide wire. A selective left uterine angiogram was performed. This demonstrated patency of the left uterine artery. Mild diffuse hypervascularity of the enlarged fibroid uterus. Access was adequate for embolization. For embolization, 1 vial of 500 - 700 micron Embospheres were injected into the left uterine artery. Post embolization angiogram confirms complete stasis of the left uterine vascular territory. Microcatheter was removed. The C2 catheter was retracted and utilized to select the right internal iliac artery. Selective right internal iliac angiogram was performed. The patent right uterine artery was identified. For selective catheterization, the micro catheter and guidewire were utilized to select the right uterine artery. Selective right uterine angiogram was performed. This demonstrated patency of the right uterine artery. Catheter position was safe for embolization. Embolization was performed to complete stasis with injection of 0.5 vials of 500-700 micron Embospheres. Post embolization angiogram confirms complete stasis of the right uterine vascular territory. At this point, all wires, catheters and sheaths were removed from the patient. Hemostasis was achieved at the right groin access site with The patient tolerated the procedure well without immediate post procedural complication. FINDINGS: Bilateral internal iliac arteriograms demonstrates conventional branching pattern with widely patent origins of the bilateral uterine arteries. Sub  selective bilateral uterine arteriograms demonstrate co-dominant supply to a hypertrophied myomatous uterus. Completion arteriograms following bilateral uterine artery particle embolization demonstrates a technically excellent result with stasis of flow within the bilateral uterine vascular territories. IMPRESSION: Successful bilateral uterine artery embolization (U F E). PLAN: The patient will be seen in follow-up consultation at the interventional radiology clinic in approximately 3-4 weeks. Electronically Signed   By: Sandi Mariscal M.D.   On: 12/07/2016 12:53   Ir Angiogram Pelvis Selective Or Supraselective  Result Date: 12/07/2016 INDICATION: Symptomatic uterine fibroids. Patient recently started on anti coagulation secondary DVT and pulmonary embolism with associated worsening of her menorrhagia. As such, patient presents now for urine fibroid embolization. EXAM: Uterine Fibroid Embolization MEDICATIONS: Toradol 30 mg IV; Ancef 2 gm IV. The antibiotic was administered within one hour of the procedure ANESTHESIA/SEDATION: Moderate (conscious) sedation was employed during this procedure. A total of Versed 3 mg and Fentanyl 200 mcg was administered intravenously. Moderate Sedation Time: 84 minutes. [name] level of consciousness and vital signs were monitored continuously by radiology nursing throughout the procedure under my direct supervision. CONTRAST:  80 cc Isovue 300 FLUOROSCOPY TIME:  16 minutes 36 seconds (0,786 mGy) COMPLICATIONS: None immediate. PROCEDURE: Informed consent was obtained from the patient following explanation of the procedure, risks, benefits and alternatives. The patient understands, agrees and consents for the procedure.  All questions were addressed. A time out was performed prior to the initiation of the procedure. Maximal barrier sterile technique utilized including caps, mask, sterile gowns, sterile gloves, large sterile drape, hand hygiene, and Betadine prep. The right  femoral head was marked fluoroscopically. Under sterile conditions and local anesthesia, the right common femoral artery access was performed with a micropuncture needle. Under direct ultrasound guidance, the right common femoral was accessed with a micropuncture kit. An ultrasound image was saved for documentation purposes. This allowed for placement of a 5-French vascular sheath. A limited arteriogram was performed through the side arm of the sheath confirming appropriate access within the right common femoral artery. The 5-French C2 catheter was utilized to select the contralateral left internal iliac artery. Selective left internal iliac angiogram was performed. The tortuous left uterine artery was identified. Selective catheterization was performed of the left uterine artery with a microcatheter and micro guide wire. A selective left uterine angiogram was performed. This demonstrated patency of the left uterine artery. Mild diffuse hypervascularity of the enlarged fibroid uterus. Access was adequate for embolization. For embolization, 1 vial of 500 - 700 micron Embospheres were injected into the left uterine artery. Post embolization angiogram confirms complete stasis of the left uterine vascular territory. Microcatheter was removed. The C2 catheter was retracted and utilized to select the right internal iliac artery. Selective right internal iliac angiogram was performed. The patent right uterine artery was identified. For selective catheterization, the micro catheter and guidewire were utilized to select the right uterine artery. Selective right uterine angiogram was performed. This demonstrated patency of the right uterine artery. Catheter position was safe for embolization. Embolization was performed to complete stasis with injection of 0.5 vials of 500-700 micron Embospheres. Post embolization angiogram confirms complete stasis of the right uterine vascular territory. At this point, all wires, catheters  and sheaths were removed from the patient. Hemostasis was achieved at the right groin access site with The patient tolerated the procedure well without immediate post procedural complication. FINDINGS: Bilateral internal iliac arteriograms demonstrates conventional branching pattern with widely patent origins of the bilateral uterine arteries. Sub selective bilateral uterine arteriograms demonstrate co-dominant supply to a hypertrophied myomatous uterus. Completion arteriograms following bilateral uterine artery particle embolization demonstrates a technically excellent result with stasis of flow within the bilateral uterine vascular territories. IMPRESSION: Successful bilateral uterine artery embolization (U F E). PLAN: The patient will be seen in follow-up consultation at the interventional radiology clinic in approximately 3-4 weeks. Electronically Signed   By: Sandi Mariscal M.D.   On: 12/07/2016 12:53   Ir Angiogram Selective Each Additional Vessel  Result Date: 12/07/2016 INDICATION: Symptomatic uterine fibroids. Patient recently started on anti coagulation secondary DVT and pulmonary embolism with associated worsening of her menorrhagia. As such, patient presents now for urine fibroid embolization. EXAM: Uterine Fibroid Embolization MEDICATIONS: Toradol 30 mg IV; Ancef 2 gm IV. The antibiotic was administered within one hour of the procedure ANESTHESIA/SEDATION: Moderate (conscious) sedation was employed during this procedure. A total of Versed 3 mg and Fentanyl 200 mcg was administered intravenously. Moderate Sedation Time: 84 minutes. [name] level of consciousness and vital signs were monitored continuously by radiology nursing throughout the procedure under my direct supervision. CONTRAST:  80 cc Isovue 300 FLUOROSCOPY TIME:  16 minutes 36 seconds (9,024 mGy) COMPLICATIONS: None immediate. PROCEDURE: Informed consent was obtained from the patient following explanation of the procedure, risks,  benefits and alternatives. The patient understands, agrees and consents for the procedure. All questions were  addressed. A time out was performed prior to the initiation of the procedure. Maximal barrier sterile technique utilized including caps, mask, sterile gowns, sterile gloves, large sterile drape, hand hygiene, and Betadine prep. The right femoral head was marked fluoroscopically. Under sterile conditions and local anesthesia, the right common femoral artery access was performed with a micropuncture needle. Under direct ultrasound guidance, the right common femoral was accessed with a micropuncture kit. An ultrasound image was saved for documentation purposes. This allowed for placement of a 5-French vascular sheath. A limited arteriogram was performed through the side arm of the sheath confirming appropriate access within the right common femoral artery. The 5-French C2 catheter was utilized to select the contralateral left internal iliac artery. Selective left internal iliac angiogram was performed. The tortuous left uterine artery was identified. Selective catheterization was performed of the left uterine artery with a microcatheter and micro guide wire. A selective left uterine angiogram was performed. This demonstrated patency of the left uterine artery. Mild diffuse hypervascularity of the enlarged fibroid uterus. Access was adequate for embolization. For embolization, 1 vial of 500 - 700 micron Embospheres were injected into the left uterine artery. Post embolization angiogram confirms complete stasis of the left uterine vascular territory. Microcatheter was removed. The C2 catheter was retracted and utilized to select the right internal iliac artery. Selective right internal iliac angiogram was performed. The patent right uterine artery was identified. For selective catheterization, the micro catheter and guidewire were utilized to select the right uterine artery. Selective right uterine angiogram was  performed. This demonstrated patency of the right uterine artery. Catheter position was safe for embolization. Embolization was performed to complete stasis with injection of 0.5 vials of 500-700 micron Embospheres. Post embolization angiogram confirms complete stasis of the right uterine vascular territory. At this point, all wires, catheters and sheaths were removed from the patient. Hemostasis was achieved at the right groin access site with The patient tolerated the procedure well without immediate post procedural complication. FINDINGS: Bilateral internal iliac arteriograms demonstrates conventional branching pattern with widely patent origins of the bilateral uterine arteries. Sub selective bilateral uterine arteriograms demonstrate co-dominant supply to a hypertrophied myomatous uterus. Completion arteriograms following bilateral uterine artery particle embolization demonstrates a technically excellent result with stasis of flow within the bilateral uterine vascular territories. IMPRESSION: Successful bilateral uterine artery embolization (U F E). PLAN: The patient will be seen in follow-up consultation at the interventional radiology clinic in approximately 3-4 weeks. Electronically Signed   By: Sandi Mariscal M.D.   On: 12/07/2016 12:53   Ir Angiogram Selective Each Additional Vessel  Result Date: 12/07/2016 INDICATION: Symptomatic uterine fibroids. Patient recently started on anti coagulation secondary DVT and pulmonary embolism with associated worsening of her menorrhagia. As such, patient presents now for urine fibroid embolization. EXAM: Uterine Fibroid Embolization MEDICATIONS: Toradol 30 mg IV; Ancef 2 gm IV. The antibiotic was administered within one hour of the procedure ANESTHESIA/SEDATION: Moderate (conscious) sedation was employed during this procedure. A total of Versed 3 mg and Fentanyl 200 mcg was administered intravenously. Moderate Sedation Time: 84 minutes. [name] level of  consciousness and vital signs were monitored continuously by radiology nursing throughout the procedure under my direct supervision. CONTRAST:  80 cc Isovue 300 FLUOROSCOPY TIME:  16 minutes 36 seconds (8,882 mGy) COMPLICATIONS: None immediate. PROCEDURE: Informed consent was obtained from the patient following explanation of the procedure, risks, benefits and alternatives. The patient understands, agrees and consents for the procedure. All questions were addressed. A time out  was performed prior to the initiation of the procedure. Maximal barrier sterile technique utilized including caps, mask, sterile gowns, sterile gloves, large sterile drape, hand hygiene, and Betadine prep. The right femoral head was marked fluoroscopically. Under sterile conditions and local anesthesia, the right common femoral artery access was performed with a micropuncture needle. Under direct ultrasound guidance, the right common femoral was accessed with a micropuncture kit. An ultrasound image was saved for documentation purposes. This allowed for placement of a 5-French vascular sheath. A limited arteriogram was performed through the side arm of the sheath confirming appropriate access within the right common femoral artery. The 5-French C2 catheter was utilized to select the contralateral left internal iliac artery. Selective left internal iliac angiogram was performed. The tortuous left uterine artery was identified. Selective catheterization was performed of the left uterine artery with a microcatheter and micro guide wire. A selective left uterine angiogram was performed. This demonstrated patency of the left uterine artery. Mild diffuse hypervascularity of the enlarged fibroid uterus. Access was adequate for embolization. For embolization, 1 vial of 500 - 700 micron Embospheres were injected into the left uterine artery. Post embolization angiogram confirms complete stasis of the left uterine vascular territory. Microcatheter was  removed. The C2 catheter was retracted and utilized to select the right internal iliac artery. Selective right internal iliac angiogram was performed. The patent right uterine artery was identified. For selective catheterization, the micro catheter and guidewire were utilized to select the right uterine artery. Selective right uterine angiogram was performed. This demonstrated patency of the right uterine artery. Catheter position was safe for embolization. Embolization was performed to complete stasis with injection of 0.5 vials of 500-700 micron Embospheres. Post embolization angiogram confirms complete stasis of the right uterine vascular territory. At this point, all wires, catheters and sheaths were removed from the patient. Hemostasis was achieved at the right groin access site with The patient tolerated the procedure well without immediate post procedural complication. FINDINGS: Bilateral internal iliac arteriograms demonstrates conventional branching pattern with widely patent origins of the bilateral uterine arteries. Sub selective bilateral uterine arteriograms demonstrate co-dominant supply to a hypertrophied myomatous uterus. Completion arteriograms following bilateral uterine artery particle embolization demonstrates a technically excellent result with stasis of flow within the bilateral uterine vascular territories. IMPRESSION: Successful bilateral uterine artery embolization (U F E). PLAN: The patient will be seen in follow-up consultation at the interventional radiology clinic in approximately 3-4 weeks. Electronically Signed   By: Sandi Mariscal M.D.   On: 12/07/2016 12:53   Ir US Guide Vasc Access Right  Result Date: 12/07/2016 INDICATION: Symptomatic uterine fibroids. Patient recently started on anti coagulation secondary DVT and pulmonary embolism with associated worsening of her menorrhagia. As such, patient presents now for urine fibroid embolization. EXAM: Uterine Fibroid Embolization  MEDICATIONS: Toradol 30 mg IV; Ancef 2 gm IV. The antibiotic was administered within one hour of the procedure ANESTHESIA/SEDATION: Moderate (conscious) sedation was employed during this procedure. A total of Versed 3 mg and Fentanyl 200 mcg was administered intravenously. Moderate Sedation Time: 84 minutes. [name] level of consciousness and vital signs were monitored continuously by radiology nursing throughout the procedure under my direct supervision. CONTRAST:  80 cc Isovue 300 FLUOROSCOPY TIME:  16 minutes 36 seconds (6,701 mGy) COMPLICATIONS: None immediate. PROCEDURE: Informed consent was obtained from the patient following explanation of the procedure, risks, benefits and alternatives. The patient understands, agrees and consents for the procedure. All questions were addressed. A time out was performed prior to  the initiation of the procedure. Maximal barrier sterile technique utilized including caps, mask, sterile gowns, sterile gloves, large sterile drape, hand hygiene, and Betadine prep. The right femoral head was marked fluoroscopically. Under sterile conditions and local anesthesia, the right common femoral artery access was performed with a micropuncture needle. Under direct ultrasound guidance, the right common femoral was accessed with a micropuncture kit. An ultrasound image was saved for documentation purposes. This allowed for placement of a 5-French vascular sheath. A limited arteriogram was performed through the side arm of the sheath confirming appropriate access within the right common femoral artery. The 5-French C2 catheter was utilized to select the contralateral left internal iliac artery. Selective left internal iliac angiogram was performed. The tortuous left uterine artery was identified. Selective catheterization was performed of the left uterine artery with a microcatheter and micro guide wire. A selective left uterine angiogram was performed. This demonstrated patency of the  left uterine artery. Mild diffuse hypervascularity of the enlarged fibroid uterus. Access was adequate for embolization. For embolization, 1 vial of 500 - 700 micron Embospheres were injected into the left uterine artery. Post embolization angiogram confirms complete stasis of the left uterine vascular territory. Microcatheter was removed. The C2 catheter was retracted and utilized to select the right internal iliac artery. Selective right internal iliac angiogram was performed. The patent right uterine artery was identified. For selective catheterization, the micro catheter and guidewire were utilized to select the right uterine artery. Selective right uterine angiogram was performed. This demonstrated patency of the right uterine artery. Catheter position was safe for embolization. Embolization was performed to complete stasis with injection of 0.5 vials of 500-700 micron Embospheres. Post embolization angiogram confirms complete stasis of the right uterine vascular territory. At this point, all wires, catheters and sheaths were removed from the patient. Hemostasis was achieved at the right groin access site with The patient tolerated the procedure well without immediate post procedural complication. FINDINGS: Bilateral internal iliac arteriograms demonstrates conventional branching pattern with widely patent origins of the bilateral uterine arteries. Sub selective bilateral uterine arteriograms demonstrate co-dominant supply to a hypertrophied myomatous uterus. Completion arteriograms following bilateral uterine artery particle embolization demonstrates a technically excellent result with stasis of flow within the bilateral uterine vascular territories. IMPRESSION: Successful bilateral uterine artery embolization (U F E). PLAN: The patient will be seen in follow-up consultation at the interventional radiology clinic in approximately 3-4 weeks. Electronically Signed   By: Sandi Mariscal M.D.   On: 12/07/2016 12:53    Ir Embo Arterial Not [place] Guide Roadmapping  Result Date: 12/07/2016 INDICATION: Symptomatic uterine fibroids. Patient recently started on anti coagulation secondary DVT and pulmonary embolism with associated worsening of her menorrhagia. As such, patient presents now for urine fibroid embolization. EXAM: Uterine Fibroid Embolization MEDICATIONS: Toradol 30 mg IV; Ancef 2 gm IV. The antibiotic was administered within one hour of the procedure ANESTHESIA/SEDATION: Moderate (conscious) sedation was employed during this procedure. A total of Versed 3 mg and Fentanyl 200 mcg was administered intravenously. Moderate Sedation Time: 84 minutes. [name] level of consciousness and vital signs were monitored continuously by radiology nursing throughout the procedure under my direct supervision. CONTRAST:  80 cc Isovue 300 FLUOROSCOPY TIME:  16 minutes 36 seconds (0,350 mGy) COMPLICATIONS: None immediate. PROCEDURE: Informed consent was obtained from the patient following explanation of the procedure, risks, benefits and alternatives. The patient understands, agrees and consents for the procedure. All questions were addressed. A time out was performed prior to the  initiation of the procedure. Maximal barrier sterile technique utilized including caps, mask, sterile gowns, sterile gloves, large sterile drape, hand hygiene, and Betadine prep. The right femoral head was marked fluoroscopically. Under sterile conditions and local anesthesia, the right common femoral artery access was performed with a micropuncture needle. Under direct ultrasound guidance, the right common femoral was accessed with a micropuncture kit. An ultrasound image was saved for documentation purposes. This allowed for placement of a 5-French vascular sheath. A limited arteriogram was performed through the side arm of the sheath confirming appropriate access within the right common femoral artery. The 5-French C2 catheter was utilized  to select the contralateral left internal iliac artery. Selective left internal iliac angiogram was performed. The tortuous left uterine artery was identified. Selective catheterization was performed of the left uterine artery with a microcatheter and micro guide wire. A selective left uterine angiogram was performed. This demonstrated patency of the left uterine artery. Mild diffuse hypervascularity of the enlarged fibroid uterus. Access was adequate for embolization. For embolization, 1 vial of 500 - 700 micron Embospheres were injected into the left uterine artery. Post embolization angiogram confirms complete stasis of the left uterine vascular territory. Microcatheter was removed. The C2 catheter was retracted and utilized to select the right internal iliac artery. Selective right internal iliac angiogram was performed. The patent right uterine artery was identified. For selective catheterization, the micro catheter and guidewire were utilized to select the right uterine artery. Selective right uterine angiogram was performed. This demonstrated patency of the right uterine artery. Catheter position was safe for embolization. Embolization was performed to complete stasis with injection of 0.5 vials of 500-700 micron Embospheres. Post embolization angiogram confirms complete stasis of the right uterine vascular territory. At this point, all wires, catheters and sheaths were removed from the patient. Hemostasis was achieved at the right groin access site with The patient tolerated the procedure well without immediate post procedural complication. FINDINGS: Bilateral internal iliac arteriograms demonstrates conventional branching pattern with widely patent origins of the bilateral uterine arteries. Sub selective bilateral uterine arteriograms demonstrate co-dominant supply to a hypertrophied myomatous uterus. Completion arteriograms following bilateral uterine artery particle embolization demonstrates a technically  excellent result with stasis of flow within the bilateral uterine vascular territories. IMPRESSION: Successful bilateral uterine artery embolization (U F E). PLAN: The patient will be seen in follow-up consultation at the interventional radiology clinic in approximately 3-4 weeks. Electronically Signed   By: Sandi Mariscal M.D.   On: 12/07/2016 12:53    Labs:  CBC:  Recent Labs  12/06/16 0631 12/07/16 0517 12/08/16 0552 12/09/16 0501  WBC 11.3* 11.1* 12.5* 12.9*  HGB 9.5* 9.6* 10.5* 10.0*  HCT 28.2* 28.5* 31.1* 29.6*  PLT 442* 403* 426* 381    COAGS:  Recent Labs  01/09/16 1239  12/07/16 0517 12/08/16 0552 12/08/16 1539 12/09/16 0501  INR 1.10  --  1.14  --   --   --   APTT 24  < > 76* 62* 88* 83*  < > = values in this interval not displayed.  BMP:  Recent Labs  12/04/16 0254 12/05/16 0650 12/08/16 0552 12/09/16 0501  NA 136 135 136 136  K 3.6 4.1 3.9 3.9  CL 108 106 106 108  CO2 23 25 19* 19*  GLUCOSE 304* 231* 127* 100*  BUN 8 10 12 17   CALCIUM 8.4* 8.3* 9.3 9.1  CREATININE 1.16* 1.09* 0.89 1.33*  GFRNONAA 57* >60 >60 48*  GFRAA >60 >60 >60 56*  LIVER FUNCTION TESTS:  Recent Labs  11/11/16 1608 11/25/16 1423 11/28/16 2103 12/02/16 0312  BILITOT 1.3* 1.2 0.7 0.5  AST 34 29 20 19   ALT 6* 9* 10* 9*  ALKPHOS 47 51 43 50  PROT 6.8 7.8 7.1 7.8  ALBUMIN 3.1* 3.3* 3.1* 3.4*    Assessment and Plan: 1. S/p UFE for menorrhagia in the setting of anticoagulation for PE  Patient is still having bleeding.  This is not unexpected.  This procedure does not instantly stop bleeding.  She is having some nausea, however, this is difficult to determine its chronicity as she has gastroparesis as well.    Unfortunately, her creatinine has increased today to 1.33 from 0.89 yesterday.  She is on Toradol prn.  Her Creatinine needs to be closely followed to make sure this doesn't worsen.  Toradol may need to be held or even DC if this continues to go  up.  Electronically Signed: Henreitta Cea 12/09/2016, 12:47 PM   I spent a total of 15 Minutes at the the patient's bedside AND on the patient's hospital floor or unit, greater than 50% of which was counseling/coordinating care for menorrhagia

## 2016-12-09 NOTE — Discharge Instructions (Signed)

## 2016-12-09 NOTE — Progress Notes (Signed)
Pt had 2 episodes of blood clot emesis. MD resident on call notified. Will continue to monitor.

## 2016-12-09 NOTE — Progress Notes (Signed)
   Subjective: Pt continues to have reported nausea and decreased PO intake as a result.  She continues to have abdominal pain.  She also reports bringing up a couple blood clots but is unsure if from emesis or coughing.      Objective:  Vital signs in last 24 hours: Vitals:   12/08/16 0900 12/08/16 1430 12/08/16 2101 12/09/16 0411  BP:  102/79 140/88 (!) 153/77  Pulse:  (!) 102 (!) 102 (!) 106  Resp: 13 16 18 17   Temp:  98.5 F (36.9 C) 99.3 F (37.4 C) 99.2 F (37.3 C)  TempSrc:  Oral Oral Oral  SpO2: 98% 98% 100% 100%  Weight:      Height:       Physical Exam  Constitutional: She appears well-developed and well-nourished. No distress.  Resting in bed   HENT:  Head: Normocephalic and atraumatic.  Pulmonary/Chest: Effort normal. No respiratory distress.  Neurological: She is alert. No cranial nerve deficit.     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 No significant electrolyte abnormalities. Her abdominal pain is now related to her procedure, see details below.   She reports that she is not maintaining PO intake but it has been documented this morning that she ate 75% of her breakfast. --Continue nti-emetics- IV Phenergan q6hr prn, PO Reglan TID, Zofran q6hr prn --Pt would like to continue carb modified diet and declines going to liquids --Continue home PPI --Bowel regimen- miralax, senna --BMP daily     Vaginal bleeding, known uterine fundal fibroid Iron deficiency anemia  Known history of uterine fibroid with associated irregular uterine bleeding, imaged with pelvic, transvaginal US on admission. Ongoing vaginal bleeding since being on anti-coagulation for PE though conflicting information regarding severity and response to megestrol per OB notes from late July. Hgb 10.0 on presentation, improved from prior  suggesting bleeding has improved. Pt was also not taking oral Fe as outpatient, received IV Fe on recent admission. She underwent embolization of the fibroid to provide better control of bleeding. Current Hgb 10 this morning.  She reports bringing up blood clots either with coughing or emesis as she is unsure.  Hemoglobin remains stable. --Continue oral percocet, IV Toradol, oral Tyelenol for pain control   --Continue Megace 80 mg BID --Continue oral Fe --CBC daily, continue to monitor    History of Bilateral PE, L LE DVT  Stable on recent imaging, on home Eliquis after switching from Xarelto. On further review it appears her DVT/PE may have been considered provoked with some small increased risk with Depo-provera, hospitalization 2 months prior. Negative hypercoagulability workup with normal AT3, beta 2 glycoprotein and anticardiolipin abs, factor V leiden, prothrombin mutation.    --Transition to Eliquis today   Diabetes Mellitus, insulin dependent On home insulin 70/30 25 U BID, most recent A1c 8.6 in 08/2016. --Novolog 70/30 25 U BID, SSI    Dispo: Anticipated discharge in approximately 2-3 day(s).   Jule Ser, DO 12/09/2016, 12:09 PM

## 2016-12-09 NOTE — Progress Notes (Signed)
ANTICOAGULATION CONSULT NOTE - Follow Up Consult  Pharmacy Consult for heparin dosing Indication: pulmonary embolus  Allergies  Allergen Reactions  . Lisinopril Nausea Only and Swelling  . Morphine And Related Itching and Swelling    Patient Measurements: Height: 5\' 7"  (170.2 cm) Weight: 212 lb 2 oz (96.2 kg) IBW/kg (Calculated) : 61.6 Heparin Dosing Weight: 82.5 kg  Vital Signs: Temp: 99.2 F (37.3 C) (08/18 0411) Temp Source: Oral (08/18 0411) BP: 153/77 (08/18 0411) Pulse Rate: 106 (08/18 0411)  Labs:  Recent Labs  12/07/16 0517 12/08/16 0552 12/08/16 1539 12/09/16 0501  HGB 9.6* 10.5*  --  10.0*  HCT 28.5* 31.1*  --  29.6*  PLT 403* 426*  --  381  APTT 76* 62* 88* 83*  LABPROT 14.6  --   --   --   INR 1.14  --   --   --   HEPARINUNFRC 0.94* 0.62 1.01* 1.06*  CREATININE  --  0.89  --  1.33*    Estimated Creatinine Clearance: 64.9 mL/min (A) (by C-G formula based on SCr of 1.33 mg/dL (H)).   Medications:  Scheduled:  . acetaminophen  1,000 mg Oral Q8H  . aspirin EC  81 mg Oral Daily  . ferrous sulfate  325 mg Oral BID WC  . hydrochlorothiazide  25 mg Oral Daily  . insulin aspart  0-9 Units Subcutaneous TID WC  . insulin aspart protamine- aspart  25 Units Subcutaneous BID WC  . losartan  50 mg Oral Daily  . megestrol  80 mg Oral BID  . metoCLOPramide  10 mg Oral TID AC & HS  . metoprolol tartrate  50 mg Oral BID  . pantoprazole  80 mg Oral Daily  . polyethylene glycol  17 g Oral Daily  . pravastatin  20 mg Oral q1800  . senna  1 tablet Oral BID   Infusions:  . heparin 1,700 Units/hr (12/09/16 0443)    Assessment: [name] is 21 YOF with PMH of DVT and PE who presented to ED with vaginal bleeding reported by patient since initiation of apixaban. Patient transitioned from apixaban to heparin for procedure on 8/16. Heparin restarted 8/16 at 2330 (12 hr post procedure). Monitoring aPTT in addition to anti Xa due to lab interaction of apixaban with  anti Xa. Recent anti Xa level is supratherapeutic, but likely skewed at 1.06 as aPTT is therapeutic at 83 sec on heparin 1700 units/hr. Plan to continue this heparin rate since aPTT is the more reliable measurement at this time.  Per note, plan is to transition back to Eliquis PO when tolerating meals. Currently, not eating.   Goal of Therapy:  Heparin level 0.3-0.7 units/ml aPTT 66-102 seconds Monitor platelets by anticoagulation protocol: Yes   Plan:  Continue heparin infusion at 1700 units/hr Check anti-Xa level and aPTT daily while on heparin for Continue to monitor H&H and platelets F/u s/sx bleeding  Nida Boatman, PharmD PGY1 Acute Care Pharmacy Resident Pager: [phone] 12/09/2016,8:25 AM

## 2016-12-10 LAB — CBC
HEMATOCRIT: 30.2 % — AB (ref 36.0–46.0)
HEMOGLOBIN: 10.1 g/dL — AB (ref 12.0–15.0)
MCH: 26 pg (ref 26.0–34.0)
MCHC: 33.4 g/dL (ref 30.0–36.0)
MCV: 77.8 fL — ABNORMAL LOW (ref 78.0–100.0)
Platelets: 375 10*3/uL (ref 150–400)
RBC: 3.88 MIL/uL (ref 3.87–5.11)
RDW: 19.8 % — AB (ref 11.5–15.5)
WBC: 12 10*3/uL — ABNORMAL HIGH (ref 4.0–10.5)

## 2016-12-10 LAB — BASIC METABOLIC PANEL
ANION GAP: 8 (ref 5–15)
BUN: 17 mg/dL (ref 6–20)
CALCIUM: 9 mg/dL (ref 8.9–10.3)
CHLORIDE: 107 mmol/L (ref 101–111)
CO2: 21 mmol/L — AB (ref 22–32)
CREATININE: 1.21 mg/dL — AB (ref 0.44–1.00)
GFR, EST NON AFRICAN AMERICAN: 54 mL/min — AB (ref >60)
Glucose, Bld: 137 mg/dL — ABNORMAL HIGH (ref 65–99)
POTASSIUM: 4.1 mmol/L (ref 3.5–5.1)
Sodium: 136 mmol/L (ref 135–145)

## 2016-12-10 LAB — GLUCOSE, CAPILLARY
GLUCOSE-CAPILLARY: 289 mg/dL — AB (ref 65–99)
GLUCOSE-CAPILLARY: 228 mg/dL — AB (ref 65–99)
Glucose-Capillary: 126 mg/dL — ABNORMAL HIGH (ref 65–99)
Glucose-Capillary: 227 mg/dL — ABNORMAL HIGH (ref 65–99)

## 2016-12-10 MED ORDER — OXYCODONE HCL 5 MG PO TABS
10.0000 mg | ORAL_TABLET | ORAL | Status: DC | PRN
  Administered 2016-12-10 – 2016-12-11 (×5): 10 mg via ORAL
  Filled 2016-12-10 (×5): qty 2

## 2016-12-10 MED ORDER — KETOROLAC TROMETHAMINE 30 MG/ML IJ SOLN: 15.0000 mg | Freq: Four times a day (QID) | INTRAMUSCULAR | Status: DC | PRN

## 2016-12-10 NOTE — Progress Notes (Signed)
   Subjective: She complains of continued abdominal pain and nausea. No further episodes of coughing up small blood clots, no emesis. Tolerating PO intake and taking PO medications without issues.        Objective:  Vital signs in last 24 hours: Vitals:   12/09/16 1435 12/09/16 2130 12/10/16 0500 12/10/16 0609  BP: (!) 107/59 136/86  (!) 157/99  Pulse: (!) 102 (!) 103  93  Resp: 18 18  18   Temp: 99.7 F (37.6 C) 99.3 F (37.4 C)  99 F (37.2 C)  TempSrc: Oral Oral  Oral  SpO2: 99% 100%  99%  Weight:   211 lb (95.7 kg)   Height:       Physical Exam  Constitutional: She appears well-developed and well-nourished. No distress.  Resting in bed   HENT:  Head: Normocephalic and atraumatic.  Pulmonary/Chest: Effort normal. No respiratory distress.  Abdominal: Soft. She exhibits no distension.  TTP to RLQ   Neurological: She is alert. No cranial nerve deficit.     Assessment/Plan: Abdominal pain, nausea with hx of Diabetic Gastoparesis  History of gastroparesis which is felt to be most likely etiology of her sx with recent workup negative for other acute processes. No significant electrolyte abnormalities. Her abdominal pain is now related to her procedure, see details below. She reports that she is not maintaining significant PO intake which does not correlate with nursing documentation and pt request to maintain current diet order.  --Continue anti-emetics- IV Phenergan q6hr prn, PO Reglan TID, Zofran q6hr prn --Pt would like to continue carb modified diet and declines going to liquids --Continue home PPI --Bowel regimen- miralax, senna --BMP daily     Vaginal bleeding, known uterine fundal fibroid Iron deficiency anemia  Known history of uterine fibroid with associated irregular uterine bleeding, imaged with pelvic, transvaginal US on admission. Ongoing vaginal bleeding since being on anti-coagulation for PE though conflicting information regarding severity and response to  megestrol per OB notes from late July. Hgb 10.0 on presentation, improved from prior suggesting bleeding has improved. Pt was also not taking oral Fe as outpatient, received IV Fe on recent admission. She underwent embolization of the fibroid to provide better control of bleeding. Isolated episodes of blood with cough/emesis have resolved. Current Hgb 10.1. Cr increased slightly in setting of Toradol use, currently 1.2  --Continue oral oxycodone, IV Toradol, oral Tyelenol for pain control --Decrease Toradol to 15 mg, cont to closely monitor renal function --Continue Megace 80 mg BID --Continue oral Fe --CBC daily, continue to monitor    History of Bilateral PE, L LE DVT  Stable on recent imaging, on home Eliquis after switching from Xarelto. On further review it appears her DVT/PE may have been considered provoked with some small increased risk with Depo-provera, hospitalization 2 months prior. Negative hypercoagulability workup with normal AT3, beta 2 glycoprotein and anticardiolipin abs, factor V leiden, prothrombin mutation.    --Continue Eliquis    Diabetes Mellitus, insulin dependent On home insulin 70/30 25 U BID, most recent A1c 8.6 in 08/2016. --Novolog 70/30 25 U BID, SSI    Dispo: Anticipated discharge in approximately 2-3 day(s).   Tawny Asal, MD 12/10/2016, 8:37 AM

## 2016-12-11 DIAGNOSIS — T45515A Adverse effect of anticoagulants, initial encounter: Secondary | ICD-10-CM

## 2016-12-11 DIAGNOSIS — E1143 Type 2 diabetes mellitus with diabetic autonomic (poly)neuropathy: Secondary | ICD-10-CM

## 2016-12-11 DIAGNOSIS — Z885 Allergy status to narcotic agent status: Secondary | ICD-10-CM

## 2016-12-11 LAB — CBC
HEMATOCRIT: 27.9 % — AB (ref 36.0–46.0)
Hemoglobin: 9.4 g/dL — ABNORMAL LOW (ref 12.0–15.0)
MCH: 26.6 pg (ref 26.0–34.0)
MCHC: 33.7 g/dL (ref 30.0–36.0)
MCV: 79 fL (ref 78.0–100.0)
Platelets: 330 10*3/uL (ref 150–400)
RBC: 3.53 MIL/uL — ABNORMAL LOW (ref 3.87–5.11)
RDW: 19.8 % — AB (ref 11.5–15.5)
WBC: 10.9 10*3/uL — AB (ref 4.0–10.5)

## 2016-12-11 LAB — BASIC METABOLIC PANEL
ANION GAP: 6 (ref 5–15)
BUN: 14 mg/dL (ref 6–20)
CALCIUM: 8.7 mg/dL — AB (ref 8.9–10.3)
CO2: 23 mmol/L (ref 22–32)
Chloride: 107 mmol/L (ref 101–111)
Creatinine, Ser: 1.16 mg/dL — ABNORMAL HIGH (ref 0.44–1.00)
GFR calc Af Amer: >60 mL/min (ref >60)
GFR calc non Af Amer: 57 mL/min — ABNORMAL LOW (ref >60)
GLUCOSE: 143 mg/dL — AB (ref 65–99)
POTASSIUM: 4 mmol/L (ref 3.5–5.1)
Sodium: 136 mmol/L (ref 135–145)

## 2016-12-11 LAB — GLUCOSE, CAPILLARY
GLUCOSE-CAPILLARY: 206 mg/dL — AB (ref 65–99)
Glucose-Capillary: 161 mg/dL — ABNORMAL HIGH (ref 65–99)
Glucose-Capillary: 178 mg/dL — ABNORMAL HIGH (ref 65–99)

## 2016-12-11 MED ORDER — ACETAMINOPHEN 325 MG PO TABS
650.0000 mg | ORAL_TABLET | Freq: Three times a day (TID) | ORAL | Status: DC
  Administered 2016-12-11: 650 mg via ORAL
  Filled 2016-12-11: qty 2

## 2016-12-11 MED ORDER — HYDROCODONE-ACETAMINOPHEN 10-325 MG PO TABS: 1.0000 | ORAL_TABLET | Freq: Four times a day (QID) | ORAL | 0 refills | Status: DC | PRN

## 2016-12-11 MED ORDER — HYDROCODONE-ACETAMINOPHEN 10-325 MG PO TABS: 1.0000 | ORAL_TABLET | Freq: Four times a day (QID) | ORAL | Status: DC | PRN

## 2016-12-11 NOTE — Care Management Note (Signed)
Case Management Note  Patient Details  Name: Shirley [name] MRN: [id number] Date of Birth: 1975/04/26  Subjective/Objective:   Diabetic gastroparesis, vaginal bleeding, uterine fibroids                 Action/Plan: Discharge Planning: Spoke to pt and she has follow up appt at [facility] on 01/02/2017 at 1:30 pm, provided pt with brochure for clinic. Pt was started on Eliquis and will soon run out of medication. States she utilized the [facility] for medication. Provided pt with brochure to follow up with Pharmacy Medication Coordinator who will assist her with completing paperwork for Eliquis patient assistance with Manufacturer.    Expected Discharge Date:  12/11/16               Expected Discharge Plan:  Home/Self Care  In-House Referral:  NA  Discharge planning Services  CM Consult, Follow-up appt scheduled  Post Acute Care Choice:  NA Choice offered to:  NA  DME Arranged:  N/A DME Agency:  NA  HH Arranged:  NA HH Agency:  NA  Status of Service:  Completed, signed off  If discussed at [place] of Stay Meetings, dates discussed:    Additional Comments:  Erenest Rasher, RN 12/11/2016, 4:28 PM

## 2016-12-11 NOTE — Progress Notes (Signed)
   Subjective: She reports increased pain overnight not adequately relieved with her pain regimen, some nausea but able to tolerate breakfast without issue. Notes continued vaginal bleeding, pt counseled on expectations of pain and bleeding during the recovery period following procedure.       Objective:  Vital signs in last 24 hours: Vitals:   12/10/16 1548 12/10/16 2029 12/11/16 0500 12/11/16 0634  BP: 125/77 114/60  128/80  Pulse: 91 99  93  Resp: 19 16  16   Temp: 98.7 F (37.1 C) 98.9 F (37.2 C)  98.6 F (37 C)  TempSrc: Oral Oral  Oral  SpO2: 100% 100%  100%  Weight:   213 lb 8 oz (96.8 kg)   Height:       Physical Exam  Constitutional: She is oriented to person, place, and time. She appears well-developed and well-nourished.  Resting in bed, in no acute distress   HENT:  Head: Normocephalic and atraumatic.  Cardiovascular: Normal rate.   Pulmonary/Chest: Effort normal. No respiratory distress.  Neurological: She is alert and oriented to person, place, and time.  Vitals reviewed.    Assessment/Plan: Abdominal pain, nausea with hx of Diabetic Gastoparesis  History of gastroparesis which is felt to be most likely etiology of her sx with recent workup negative for other acute processes. No significant electrolyte abnormalities. Her abdominal pain is now related to her procedure, see details below. She has tolerated PO medications and intake.  --Continue anti-emetics- IV Phenergan q6hr prn, PO Reglan TID, Zofran q6hr prn --Continue carb modified diet --Continue home PPI --Bowel regimen- miralax, senna --BMP daily     Vaginal bleeding, known uterine fundal fibroid Iron deficiency anemia  Known history of uterine fibroid with associated irregular uterine bleeding, imaged with pelvic, transvaginal US on admission. Ongoing vaginal bleeding since being on anti-coagulation for PE though conflicting information regarding severity and response to megestrol per OB notes from  late July. Hgb 10.0 on presentation, improved from prior suggesting bleeding has improved. Pt was also not taking oral Fe as outpatient, received IV Fe on recent admission. She underwent embolization of the fibroid to provide better control of bleeding. Isolated episodes of blood with cough/emesis have resolved. Current Hgb 10.1. Cr increased slightly in setting of Toradol use, improved with decreased toradol dose, currently 1.16.  --Oxycodone -> hydrocodone and assess response, Toradol, oral Tyelenol for pain control --Continue Megace 80 mg BID --Continue oral Fe --CBC daily, continue to monitor    History of Bilateral PE, L LE DVT  Stable on recent imaging, on home Eliquis after switching from Xarelto. On further review it appears her DVT/PE may have been considered provoked with some small increased risk with Depo-provera, hospitalization 2 months prior. Negative hypercoagulability workup with normal AT3, beta 2 glycoprotein and anticardiolipin abs, factor V leiden, prothrombin mutation.    --Continue Eliquis    Diabetes Mellitus, insulin dependent On home insulin 70/30 25 U BID, most recent A1c 8.6 in 08/2016. --Novolog 70/30 25 U BID, SSI    Dispo: Anticipated discharge in approximately 0-1 day(s).   Tawny Asal, MD 12/11/2016, 11:34 AM

## 2016-12-11 NOTE — Discharge Summary (Signed)
Name: Shirley [name] MRN: [id number] DOB: Jun 03, 1975 41 y.o. PCP: Shirley [name]  Date of Admission: 12/02/2016  5:42 AM Date of Discharge: 12/11/2016 Attending Physician: Shirley [name], *  Discharge Diagnosis: Active Problems:   Poorly controlled type 2 diabetes mellitus with peripheral neuropathy [facility])   Hyperlipidemia   Essential hypertension   Gastroparesis   Menorrhagia   Discharge Medications: Allergies as of 12/11/2016      Reactions   Lisinopril Nausea Only, Swelling   Morphine And Related Itching, Swelling      Medication List    STOP taking these medications   aspirin EC 81 MG tablet   clindamycin 1 % gel Commonly known as:  CLINDAGEL   HYDROcodone-acetaminophen 5-325 MG tablet Commonly known as:  NORCO/VICODIN Replaced by:  HYDROcodone-acetaminophen 10-325 MG tablet   oxyCODONE-acetaminophen 5-325 MG tablet Commonly known as:  PERCOCET/ROXICET   traMADol 50 MG tablet Commonly known as:  ULTRAM     TAKE these medications   albuterol 108 (90 Base) MCG/ACT inhaler Commonly known as:  PROVENTIL HFA;VENTOLIN HFA Inhale 2 puffs into the lungs every 4 (four) hours as needed for wheezing or shortness of breath.   apixaban 5 MG Tabs tablet Commonly known as:  ELIQUIS Take 1 tablet (5 mg total) by mouth 2 (two) times daily.   ferrous sulfate 325 (65 FE) MG tablet Take 1 tablet (325 mg total) by mouth 2 (two) times daily with a meal.   freestyle lancets Use as instructed   glucose blood test strip Commonly known as:  FREESTYLE TEST STRIPS Use as instructed   glucose monitoring kit monitoring kit 1 each by Does not apply route 4 (four) times daily - after meals and at bedtime. 1 month Diabetic Testing Supplies for QAC-QHS accuchecks.   hydrochlorothiazide 25 MG tablet Commonly known as:  HYDRODIURIL Take 1 tablet (25 mg total) by mouth daily. Take on tablet in the morning. What changed:  additional instructions     HYDROcodone-acetaminophen 10-325 MG tablet Commonly known as:  NORCO Take 1 tablet by mouth every 6 (six) hours as needed for moderate pain or severe pain. Replaces:  HYDROcodone-acetaminophen 5-325 MG tablet   insulin NPH-regular Human (70-30) 100 UNIT/ML injection Commonly known as:  NOVOLIN 70/30 25 units in the morning and 25 units in the evening. What changed:  how much to take  how to take this  when to take this  additional instructions   INSULIN SYRINGE .5CC/28G 28G X 1/2" 0.5 ML Misc Commonly known as:  INS SYRINGE/NEEDLE .5CC/28G Please provide 1 month supply   losartan 50 MG tablet Commonly known as:  COZAAR Take 1 tablet (50 mg total) by mouth daily.   lovastatin 20 MG tablet Commonly known as:  MEVACOR Take 1 tablet (20 mg total) by mouth at bedtime.   megestrol 40 MG tablet Commonly known as:  MEGACE Take 2 tablets (80 mg total) by mouth 2 (two) times daily.   metoCLOPramide 10 MG tablet Commonly known as:  REGLAN Take 1 tablet (10 mg total) by mouth 4 (four) times daily -  before meals and at bedtime.   metoprolol tartrate 50 MG tablet Commonly known as:  LOPRESSOR Take 1 tablet (50 mg total) by mouth 2 (two) times daily.   omeprazole 40 MG capsule Commonly known as:  PRILOSEC Take 1 capsule (40 mg total) by mouth daily.   ondansetron 8 MG disintegrating tablet Commonly known as:  ZOFRAN ODT Take 1 tablet (8 mg total) by  mouth every 8 (eight) hours as needed for nausea or vomiting.   promethazine 25 MG tablet Commonly known as:  PHENERGAN Take 1 tablet (25 mg total) by mouth every 8 (eight) hours as needed for nausea or vomiting.       Disposition and follow-up:   Shirley [name] was discharged from [facility] in Stable condition.  At the hospital follow up visit please address:  1.  -Assess for improvement in post-operative pain and nausea, decrease in vaginal bleeding and stable Hgb -Ensure continued resolution of  Cr bump  -Assess for adherence to Eliquis, megace, oral Fe   2.  Labs / imaging needed at time of follow-up: CBC, BMP  3.  Pending labs/ test needing follow-up: None  Follow-up Appointments: Follow-up Information    Shirley Demark, PA-C. Schedule an appointment as soon as possible for a visit.   Specialty:  Physician Assistant Contact information: [place] [id number] [place] Hospital Course by problem list: Vaginal Bleeding due to Uterine Fibroid, Anti-coagulant use Pt presented with continued vaginal bleeding, on high dose of Megace, in the setting of anticoagulation with Eliquis-needed for her recent history of DVT and PE. The patient initially experienced a response to Megace therapy, but subsequently had continued vaginal bleeding requiring several pads per day. Her Hgb was stable at presentation at 10.0  IR was consulted for evaluation for uterine artery embolization to control the fibroid related bleeding as an alternative to surgery (evaluated by Ob/Gyn on previous admission). Pelvic MRI confirmed fibroid size and presence, ruled out adenomyosis. She was transitioned to a heparin drip during peri-operative management, tolerated the procedure well without complications. Post-operatively she required narcotic pain medications and toradol for pain control and anti-emetics for nausea. She had a slight increase in Cr which subsequently decreased with Toradol dose decrease and discontinuation. As she progressed, her narcotic doses were decreased and switched to oral. She tolerated her diet and PO meds well. She was discharged on a short course of hydrocodone-acetaminophen 10-325 mg and her home nausea regimen with expectant management for post-operative course of pain, vaginal bleeding, and nausea.   Diabetic Gastroparesis She presented with complaints of nausea not controlled with her home anti-emetic medications and abdominal pain. These symptoms  were consistent with her prior presentations of gastroparesis flares. She had been able to keep PO intake down and had no significant metabolic derangements on presentation. Her nausea was controlled with scheduled Reglan, zofran and phenergan prn. Her upper abdominal pain associated with gastroparesis resolved early during the admission and fibroid related pain was managed for the remainder of the hospitalization. She maintained a carb modified diet throughout the admission and declined decreasing her diet initially or during her post-operative period with increased nausea. She was discharged on her home nausea regimen.   History of DVT/PE Recent history of DVT/PE, currently on Eliquis which has contributed to her vaginal bleeding. Further details as reported above. During admission, hypercoagulability workup was investigated with no abnormal results. Normal testing for anti-thrombin III, APS, factor V leiden, prothrombin mutation.   Insulin dependent Diabetes Mellitus Recent A1c of 8.6, blood glucose managed with Novolog 70/30 25 U BID with SSI. No episodes of hypoglycemia or other diabetic complications. Discharged on home regimen.   Discharge Vitals:   BP 136/85   Pulse 96   Temp 98.9 F (37.2 C) (Oral)   Resp 16   Ht 5' 7"  (1.702  m)   Wt 213 lb 8 oz (96.8 kg)   SpO2 100%   BMI 33.44 kg/m   Pertinent Labs, Studies, and Procedures:  Uterine Artery Embolization The patent right uterine artery was identified. For selective catheterization, the micro catheter and guidewire were utilized to select the right uterine artery. Selective right uterine angiogram was performed. This demonstrated patency of the right uterine artery. Catheter position was safe for embolization. Embolization was performed to complete stasis with injection of 0.5 vials of 500-700 micron Embospheres. Post embolization angiogram confirms complete stasis of the right uterine vascular territory.  CBC    Component Value  Date/Time   WBC 10.9 (H) 12/11/2016 0343   RBC 3.53 (L) 12/11/2016 0343   HGB 9.4 (L) 12/11/2016 0343   HCT 27.9 (L) 12/11/2016 0343   PLT 330 12/11/2016 0343   MCV 79.0 12/11/2016 0343   MCH 26.6 12/11/2016 0343   MCHC 33.7 12/11/2016 0343   RDW 19.8 (H) 12/11/2016 0343   LYMPHSABS 2.6 10/18/2016 0925   MONOABS 0.7 10/18/2016 0925   EOSABS 0.4 10/18/2016 0925   BASOSABS 0.0 10/18/2016 0925   BMET    Component Value Date/Time   NA 136 12/11/2016 0343   K 4.0 12/11/2016 0343   CL 107 12/11/2016 0343   CO2 23 12/11/2016 0343   GLUCOSE 143 (H) 12/11/2016 0343   BUN 14 12/11/2016 0343   CREATININE 1.16 (H) 12/11/2016 0343   CREATININE 0.76 07/15/2012 1419   CALCIUM 8.7 (L) 12/11/2016 0343   GFRNONAA 57 (L) 12/11/2016 0343   GFRAA >60 12/11/2016 0343     Discharge Instructions: Discharge Instructions    Discharge instructions    Complete by:  As directed    -Make an appointment with your primary care doctor to follow up and see how you are doing with recovering after the procedure and how your blood count and bleeding has progressed.  -It may take some more time to recover from the procedure but you've improved a lot.  -You have a short term supply of pain medicine you had in the hospital to help. The pain will also improve with time.   Increase activity slowly    Complete by:  As directed       Signed: Tawny Asal, MD 12/11/2016, 4:06 PM   Pager: [phone]

## 2016-12-13 ENCOUNTER — Other Ambulatory Visit (HOSPITAL_COMMUNITY): Payer: Self-pay | Admitting: Interventional Radiology

## 2016-12-13 DIAGNOSIS — D250 Submucous leiomyoma of uterus: Secondary | ICD-10-CM

## 2016-12-15 ENCOUNTER — Emergency Department (HOSPITAL_COMMUNITY)
Admission: EM | Admit: 2016-12-15 | Discharge: 2016-12-16 | Disposition: A | Discharge status: Home / Self Care | Payer: Self-pay | Attending: Emergency Medicine | Admitting: Emergency Medicine

## 2016-12-15 ENCOUNTER — Encounter (HOSPITAL_COMMUNITY): Payer: Self-pay | Admitting: Emergency Medicine

## 2016-12-15 DIAGNOSIS — R109 Unspecified abdominal pain: Secondary | ICD-10-CM

## 2016-12-15 DIAGNOSIS — I10 Essential (primary) hypertension: Secondary | ICD-10-CM | POA: Insufficient documentation

## 2016-12-15 DIAGNOSIS — I2510 Atherosclerotic heart disease of native coronary artery without angina pectoris: Secondary | ICD-10-CM | POA: Insufficient documentation

## 2016-12-15 DIAGNOSIS — E119 Type 2 diabetes mellitus without complications: Secondary | ICD-10-CM | POA: Insufficient documentation

## 2016-12-15 DIAGNOSIS — F329 Major depressive disorder, single episode, unspecified: Secondary | ICD-10-CM | POA: Insufficient documentation

## 2016-12-15 DIAGNOSIS — Z8673 Personal history of transient ischemic attack (TIA), and cerebral infarction without residual deficits: Secondary | ICD-10-CM | POA: Insufficient documentation

## 2016-12-15 DIAGNOSIS — Z87891 Personal history of nicotine dependence: Secondary | ICD-10-CM | POA: Insufficient documentation

## 2016-12-15 DIAGNOSIS — Z955 Presence of coronary angioplasty implant and graft: Secondary | ICD-10-CM | POA: Insufficient documentation

## 2016-12-15 DIAGNOSIS — F419 Anxiety disorder, unspecified: Secondary | ICD-10-CM | POA: Insufficient documentation

## 2016-12-15 DIAGNOSIS — Z79899 Other long term (current) drug therapy: Secondary | ICD-10-CM | POA: Insufficient documentation

## 2016-12-15 DIAGNOSIS — Z7901 Long term (current) use of anticoagulants: Secondary | ICD-10-CM | POA: Insufficient documentation

## 2016-12-15 DIAGNOSIS — Z794 Long term (current) use of insulin: Secondary | ICD-10-CM | POA: Insufficient documentation

## 2016-12-15 DIAGNOSIS — E860 Dehydration: Secondary | ICD-10-CM | POA: Insufficient documentation

## 2016-12-15 DIAGNOSIS — J45909 Unspecified asthma, uncomplicated: Secondary | ICD-10-CM | POA: Insufficient documentation

## 2016-12-15 DIAGNOSIS — I252 Old myocardial infarction: Secondary | ICD-10-CM | POA: Insufficient documentation

## 2016-12-15 LAB — COMPREHENSIVE METABOLIC PANEL
ALT: 11 U/L — AB (ref 14–54)
AST: 19 U/L (ref 15–41)
Albumin: 3.4 g/dL — ABNORMAL LOW (ref 3.5–5.0)
Alkaline Phosphatase: 50 U/L (ref 38–126)
Anion gap: 11 (ref 5–15)
BILIRUBIN TOTAL: 0.3 mg/dL (ref 0.3–1.2)
BUN: 20 mg/dL (ref 6–20)
CO2: 22 mmol/L (ref 22–32)
CREATININE: 1.34 mg/dL — AB (ref 0.44–1.00)
Calcium: 9.8 mg/dL (ref 8.9–10.3)
Chloride: 102 mmol/L (ref 101–111)
GFR calc Af Amer: 55 mL/min — ABNORMAL LOW (ref >60)
GFR, EST NON AFRICAN AMERICAN: 48 mL/min — AB (ref >60)
Glucose, Bld: 284 mg/dL — ABNORMAL HIGH (ref 65–99)
POTASSIUM: 4 mmol/L (ref 3.5–5.1)
Sodium: 135 mmol/L (ref 135–145)
TOTAL PROTEIN: 8.4 g/dL — AB (ref 6.5–8.1)

## 2016-12-15 LAB — URINALYSIS, ROUTINE W REFLEX MICROSCOPIC
Bacteria, UA: NONE SEEN
Bilirubin Urine: NEGATIVE
KETONES UR: NEGATIVE mg/dL
LEUKOCYTES UA: NEGATIVE
Nitrite: NEGATIVE
PH: 6 (ref 5.0–8.0)
Protein, ur: 100 mg/dL — AB
Specific Gravity, Urine: 1.014 (ref 1.005–1.030)

## 2016-12-15 LAB — POC URINE PREG, ED: Preg Test, Ur: NEGATIVE

## 2016-12-15 LAB — CBC
HCT: 31.3 % — ABNORMAL LOW (ref 36.0–46.0)
Hemoglobin: 10.7 g/dL — ABNORMAL LOW (ref 12.0–15.0)
MCH: 26.1 pg (ref 26.0–34.0)
MCHC: 34.2 g/dL (ref 30.0–36.0)
MCV: 76.3 fL — ABNORMAL LOW (ref 78.0–100.0)
PLATELETS: 476 10*3/uL — AB (ref 150–400)
RBC: 4.1 MIL/uL (ref 3.87–5.11)
RDW: 18.6 % — AB (ref 11.5–15.5)
WBC: 12 10*3/uL — AB (ref 4.0–10.5)

## 2016-12-15 LAB — LIPASE, BLOOD: Lipase: 42 U/L (ref 11–51)

## 2016-12-15 MED ORDER — ONDANSETRON 4 MG PO TBDP
4.0000 mg | ORAL_TABLET | Freq: Once | ORAL | Status: AC | PRN
  Administered 2016-12-15: 4 mg via ORAL

## 2016-12-15 MED ORDER — ONDANSETRON 4 MG PO TBDP
ORAL_TABLET | ORAL | Status: AC
  Filled 2016-12-15: qty 1

## 2016-12-15 NOTE — ED Notes (Signed)
Patient shaking in chair in lobby.  Pt c/o pain.  This RN focusing on rooming for this patient, do not want to give pain meds, encouraging patient to be NPO

## 2016-12-15 NOTE — ED Triage Notes (Signed)
Reports emesis onset today, vomited >400ML in triage. States abdominal pain all week. Recent emboli surgery. Also reports constipation.

## 2016-12-16 ENCOUNTER — Emergency Department (HOSPITAL_COMMUNITY): Payer: Self-pay

## 2016-12-16 LAB — CBG MONITORING, ED: Glucose-Capillary: 321 mg/dL — ABNORMAL HIGH (ref 65–99)

## 2016-12-16 LAB — TYPE AND SCREEN
ABO/RH(D): A POS
Antibody Screen: NEGATIVE

## 2016-12-16 LAB — PROTIME-INR
INR: 1.12
PROTHROMBIN TIME: 14.5 s (ref 11.4–15.2)

## 2016-12-16 MED ORDER — IOPAMIDOL (ISOVUE-370) INJECTION 76%
INTRAVENOUS | Status: AC
  Filled 2016-12-16: qty 100

## 2016-12-16 MED ORDER — FENTANYL CITRATE (PF) 100 MCG/2ML IJ SOLN
50.0000 ug | Freq: Once | INTRAMUSCULAR | Status: AC
  Administered 2016-12-16: 50 ug via INTRAVENOUS
  Filled 2016-12-16: qty 2

## 2016-12-16 MED ORDER — APIXABAN 5 MG PO TABS
5.0000 mg | ORAL_TABLET | Freq: Once | ORAL | Status: AC
  Administered 2016-12-16: 5 mg via ORAL
  Filled 2016-12-16: qty 1

## 2016-12-16 MED ORDER — OXYCODONE-ACETAMINOPHEN 5-325 MG PO TABS
1.0000 | ORAL_TABLET | Freq: Once | ORAL | Status: AC
  Administered 2016-12-16: 1 via ORAL
  Filled 2016-12-16: qty 1

## 2016-12-16 MED ORDER — HYDROCHLOROTHIAZIDE 25 MG PO TABS
25.0000 mg | ORAL_TABLET | Freq: Once | ORAL | Status: AC
  Administered 2016-12-16: 25 mg via ORAL
  Filled 2016-12-16: qty 1

## 2016-12-16 MED ORDER — SODIUM CHLORIDE 0.9 % IV BOLUS (SEPSIS)
1000.0000 mL | Freq: Once | INTRAVENOUS | Status: AC
  Administered 2016-12-16: 1000 mL via INTRAVENOUS

## 2016-12-16 MED ORDER — PROMETHAZINE HCL 25 MG/ML IJ SOLN
12.5000 mg | Freq: Once | INTRAMUSCULAR | Status: AC
  Administered 2016-12-16: 12.5 mg via INTRAVENOUS
  Filled 2016-12-16: qty 1

## 2016-12-16 MED ORDER — IOPAMIDOL (ISOVUE-370) INJECTION 76%
100.0000 mL | Freq: Once | INTRAVENOUS | Status: AC | PRN
  Administered 2016-12-16: 100 mL via INTRAVENOUS

## 2016-12-16 MED ORDER — PROMETHAZINE HCL 25 MG PO TABS: 25.0000 mg | ORAL_TABLET | Freq: Four times a day (QID) | ORAL | 0 refills | Status: DC | PRN

## 2016-12-16 MED ORDER — METOPROLOL TARTRATE 25 MG PO TABS
50.0000 mg | ORAL_TABLET | Freq: Once | ORAL | Status: AC
  Administered 2016-12-16: 50 mg via ORAL
  Filled 2016-12-16: qty 2

## 2016-12-16 MED ORDER — MEGESTROL ACETATE 40 MG PO TABS
80.0000 mg | ORAL_TABLET | Freq: Once | ORAL | Status: AC
  Administered 2016-12-16: 80 mg via ORAL
  Filled 2016-12-16: qty 2

## 2016-12-16 MED ORDER — HYDROCHLOROTHIAZIDE 25 MG PO TABS: 25.0000 mg | ORAL_TABLET | Freq: Every day | ORAL | Status: DC

## 2016-12-16 MED ORDER — OXYCODONE-ACETAMINOPHEN 5-325 MG PO TABS: 1.0000 | ORAL_TABLET | Freq: Four times a day (QID) | ORAL | 0 refills | Status: DC | PRN

## 2016-12-16 MED ORDER — APIXABAN 5 MG PO TABS: 5.0000 mg | ORAL_TABLET | Freq: Two times a day (BID) | ORAL | Status: DC

## 2016-12-16 MED ORDER — INSULIN ASPART PROT & ASPART (70-30 MIX) 100 UNIT/ML ~~LOC~~ SUSP
25.0000 [IU] | Freq: Every day | SUBCUTANEOUS | Status: DC
  Administered 2016-12-16: 25 [IU] via SUBCUTANEOUS
  Filled 2016-12-16: qty 10

## 2016-12-16 MED ORDER — METOPROLOL TARTRATE 25 MG PO TABS: 50.0000 mg | ORAL_TABLET | Freq: Two times a day (BID) | ORAL | Status: DC

## 2016-12-16 NOTE — ED Notes (Signed)
Pt ambulated to the bathroom. HR increased to 170 while ambulating and then maintained in the 160s while using the bathroom. Pt remains on the monitor. Pt's HR in 140s when placed back in the bed.

## 2016-12-16 NOTE — ED Provider Notes (Signed)
11: 20 -patient seen earlier, and evaluated for abdominal discomfort post uterine artery embolization for uterine bleeding.  She had tachycardia worse with ambulation so was given fluids and observed.  At this time nursing states that she has been able to ambulate easily, without significant tachycardia.      Patient Vitals for the past 24 hrs:  BP Temp Temp src Pulse Resp SpO2  12/16/16 1115 (!) 150/84 - - 81 (!) 21 100 %  12/16/16 1100 (!) 142/95 - - 87 17 100 %  12/16/16 1030 (!) 146/103 - - 89 13 99 %  12/16/16 1015 (!) 141/99 - - 91 13 98 %  12/16/16 0945 (!) 156/105 - - 98 12 98 %  12/16/16 0915 (!) 202/104 - - (!) 106 18 100 %  12/16/16 0900 (!) 193/108 - - (!) 112 17 100 %  12/16/16 0845 (!) 177/105 - - (!) 129 (!) 25 99 %  12/16/16 0830 (!) 172/96 - - (!) 120 (!) 23 100 %  12/16/16 0815 (!) 144/84 - - (!) 118 (!) 24 100 %  12/16/16 0722 (!) 161/104 99.5 F (37.5 C) Oral (!) 105 (!) 26 98 %  12/16/16 0115 (!) 165/99 - - 90 17 99 %  12/16/16 0100 (!) 156/96 - - 95 15 99 %  12/16/16 0030 (!) 162/106 - - (!) 117 (!) 21 98 %  12/16/16 0008 (!) 184/110 98.6 F (37 C) Oral (!) 118 (!) 22 98 %  12/15/16 2152 (!) 185/115 98.2 F (36.8 C) Axillary (!) 120 (!) 22 100 %    11:22 AM Reevaluation with update and discussion. After initial assessment and treatment, an updated evaluation reveals patient states she is uncomfortable with right lower abdominal pain, which has been present for 3-4 months, despite recent treatment for uterine fibroid bleeding.  She has an upcoming appointment with a gynecologist, next month.  She has run out of the [place] which she was given last week after hospital discharge.  She states that her right leg has been intermittently swelling, the site of her recent DVT.  Findings discussed with patient and all questions were answered.  Patient will be given additional prescription of Percocet, to help her discomfort which is presumed to be from uterine fibroids.  She is  encouraged to follow-up with her gynecologist sooner, by going to the [facility], if she needs additional treatment, prior to the scheduled appointment.  She is also given a prescription for Phenergan. Vertis Kelch, MD 12/16/16 1134

## 2016-12-16 NOTE — ED Notes (Signed)
MD to reassess

## 2016-12-16 NOTE — ED Notes (Signed)
Md at the bedside

## 2016-12-16 NOTE — ED Notes (Signed)
Pt ambulated to the bathroom with no trouble.HR increased to 111 and maintained in the low 100s. Pt is 85 HR at rest.

## 2016-12-16 NOTE — ED Provider Notes (Signed)
[facility] DEPT Provider Note   CSN: [id number] Arrival date & time: 12/15/16  2147     History   Chief Complaint Chief Complaint  Patient presents with  . Emesis  . Abdominal Pain    HPI Shirley [name] is a 41 y.o. female.  HPI   Patient is a 41 year old female presenting with abdominal pain. Past medical history is very complicated, history of CVA, hypertension hyperlipidemia CAD fibromyalgia migraine, diabetes, gastroparesis. Patient had recent hospital stay and was discharged a couple days ago. Patient was found to have DVT/PE. Started on Eloquis. Patient then had increased vaginal bleeding. She was trialed on by mouth Megace, without full resolution. Patient continued to bleed and therefore had a pelvic artery embolism by interventional radiology.  She said she felt better and then all of a sudden in the last days felt worse. She appears uncomfortable in the emergency department. + nausea  Past Medical History:  Diagnosis Date  . Abscess of tunica vaginalis    10/09- Abundant S. aureus- sensitive to all abx  . Anxiety   . Blood dyscrasia   . CAD (coronary artery disease) 06/15/2006   s/p Subendocardial MI with PDA angioplasty(no stent) on 06/15/06 and relook  cath 06/19/06 showed patency of site. Cath 12/10- no restenosis or significant CAD progression  . CVA (cerebral vascular accident) ([place]) ~ 02/2014   denies residual on 04/22/2014  . CVA (cerebral vascular accident) [facility])    history of remote right cerebellar infarct noted on head CT at least since 10/2011  . Depression   . Diabetes mellitus type 2, uncontrolled, with complications ([place])   . Fibromyalgia   . Gastritis   . Gastroparesis    secondary to poorly controlled DM, last emptying study performed 01/2010  was normal but may be falsely positive as pt was on reglan  . GERD (gastroesophageal reflux disease)   . Hepatitis B, chronic (HCC)    Hep BeAb+,Hep B cAb+ & Hep BsAg+ (9/06)  . History of  pyelonephritis    H/o GrpB Pyelonephritis (9/06) and UTI- 07/11- E.Coli, 12/10- GBS  . Hyperlipidemia   . Hypertension   . Iron deficiency anemia   . Irregular menses    Small ovarian follicles seen on LK(4/40)  . MI (myocardial infarction) ([place]) 05/2006   PDA percutaneous transluminal coronary angioplasty  . Migraine    "weekly" (04/22/2014)  . N&V (nausea and vomiting)    Chronic. Unclear etiology with multiple admission and ED visits. CT abdomen with and without contrast (02/2011)  showed no acute process. Gastic Emptying scan (01/2010) was normal. Ultrasound of the abdomen was within normal limits. Hepatitis B viral load was undectable. HIV NR. EGD - gastritis, Hpylori + s/p Rx  . Obesity   . OSA (obstructive sleep apnea)    "suppose to wear mask but I don't" (04/22/2014)  . Peripheral neuropathy   . Pneumonia    "this is probably the 2nd or 3rd time I've had pneumonia" (04/22/2014)  . Recurrent boils   . Seasonal asthma   . Substance abuse   . Thrombocytosis ([place])    Hem/Onc suggested 2/2 chronic hepatits and/or iron deficiency anemia    Patient Active Problem List   Diagnosis Date Noted  . Acute blood loss anemia 11/13/2016  . Menorrhagia 11/13/2016  . Bilateral pulmonary embolism ([place]) 10/30/2016  . Acute DVT (deep venous thrombosis) ([place]) 10/30/2016  . Type 2 diabetes mellitus with hyperglycemia ([place]) 04/07/2016  . Diabetic gastroparesis ([place]) 04/06/2016  . Dehydration 03/27/2015  .  Gastroparesis 11/11/2014  . Hematemesis 10/13/2014  . DKA (diabetic ketoacidoses) ([place]) 10/13/2014  . Tachycardia 07/21/2014  . Abdominal pain, chronic, left lower quadrant   . Diabetic gastroparesis associated with type 2 diabetes mellitus ([place]) 04/28/2014  . History of Helicobacter pylori infection 04/22/2014  . Vaginal discharge 02/18/2014  . Atypical chest pain 07/22/2013  . Unspecified constipation 07/21/2013  . Tinea corporis 07/21/2013  . Intractable vomiting 04/29/2013  .  Dysmenorrhea 04/22/2013  . UTI (urinary tract infection) 07/15/2012  . Headache(784.0) 02/08/2012  . Health care maintenance 01/22/2012  . Chronic hepatitis B ([place]) 03/07/2011  . History of leukocytosis 04/06/2010  . THROMBOCYTOSIS 04/06/2010  . Polysubstance abuse 02/23/2010  . Iron deficiency anemia 11/22/2009  . PERIPHERAL NEUROPATHY 10/01/2009  . Hyperlipidemia 08/30/2009  . Diabetic polyneuropathy ([place]) 08/30/2009  . Hidradenitis (recurrent boils) 07/07/2008  . Depression 12/27/2007  . Abdominal pain, left lower quadrant 11/21/2007  . FIBROMYALGIA 10/30/2007  . BACK PAIN 04/01/2007  . OBSTRUCTIVE SLEEP APNEA 01/17/2007  . ANXIETY DEPRESSION 06/27/2006  . Chronic ischemic heart disease 06/15/2006  . OBESITY, MORBID 05/15/2006  . MIGRAINE HEADACHE 05/15/2006  . Asthma 05/15/2006  . Essential hypertension 01/16/2006  . IRREGULAR MENSTRUATION 01/16/2006  . PEDAL EDEMA 01/16/2006  . Poorly controlled type 2 diabetes mellitus with peripheral neuropathy ([place]) 01/16/1989    Past Surgical History:  Procedure Laterality Date  . CESAREAN SECTION  1997  . CORONARY ANGIOPLASTY WITH STENT PLACEMENT  2008   "2 stents"  . ESOPHAGOGASTRODUODENOSCOPY N/A 04/23/2014   Procedure: ESOPHAGOGASTRODUODENOSCOPY (EGD);  Surgeon: Winfield Cunas., MD;  Location: [facility] ENDOSCOPY;  Service: Endoscopy;  Laterality: N/A;  . IR ANGIOGRAM PELVIS SELECTIVE OR SUPRASELECTIVE  12/07/2016  . IR ANGIOGRAM PELVIS SELECTIVE OR SUPRASELECTIVE  12/07/2016  . IR ANGIOGRAM SELECTIVE EACH ADDITIONAL VESSEL  12/07/2016  . IR ANGIOGRAM SELECTIVE EACH ADDITIONAL VESSEL  12/07/2016  . IR EMBO ARTERIAL NOT HEMORR HEMANG INC GUIDE ROADMAPPING  12/07/2016  . IR US GUIDE VASC ACCESS RIGHT  12/07/2016    OB History    Gravida Para Term Preterm AB Living   2 2 2          SAB TAB Ectopic Multiple Live Births           2       Home Medications    Prior to Admission medications   Medication Sig Start Date End Date  Taking? Authorizing Provider  apixaban (ELIQUIS) 5 MG TABS tablet Take 1 tablet (5 mg total) by mouth 2 (two) times daily. 11/18/16  Yes Rice, Resa Miner, MD  albuterol (PROVENTIL HFA;VENTOLIN HFA) 108 (90 Base) MCG/ACT inhaler Inhale 2 puffs into the lungs every 4 (four) hours as needed for wheezing or shortness of breath. Patient not taking: Reported on 11/20/2016 09/27/16   Clent Demark, PA-C  ferrous sulfate 325 (65 FE) MG tablet Take 1 tablet (325 mg total) by mouth 2 (two) times daily with a meal. 09/05/16   Clent Demark, PA-C  glucose blood (FREESTYLE TEST STRIPS) test strip Use as instructed 09/05/16   Clent Demark, PA-C  glucose monitoring kit (FREESTYLE) monitoring kit 1 each by Does not apply route 4 (four) times daily - after meals and at bedtime. 1 month Diabetic Testing Supplies for QAC-QHS accuchecks. 09/05/16   Clent Demark, PA-C  hydrochlorothiazide (HYDRODIURIL) 25 MG tablet Take 1 tablet (25 mg total) by mouth daily. Take on tablet in the morning. Patient taking differently: Take 25 mg by mouth daily.  09/27/16  Clent Demark, PA-C  HYDROcodone-acetaminophen [facility]) 10-325 MG tablet Take 1 tablet by mouth every 6 (six) hours as needed for moderate pain or severe pain. 12/11/16   Zada Finders, MD  insulin NPH-regular Human (NOVOLIN 70/30) (70-30) 100 UNIT/ML injection 25 units in the morning and 25 units in the evening. Patient taking differently: Inject 25 Units into the skin 2 (two) times daily with a meal.  09/27/16   Clent Demark, PA-C  INSULIN SYRINGE .5CC/28G (INS SYRINGE/NEEDLE .5CC/28G) 28G X 1/2" 0.5 ML MISC Please provide 1 month supply 09/27/16   Clent Demark, PA-C  Lancets (FREESTYLE) lancets Use as instructed 09/27/16   Clent Demark, PA-C  losartan (COZAAR) 50 MG tablet Take 1 tablet (50 mg total) by mouth daily. 09/27/16   Clent Demark, PA-C  lovastatin (MEVACOR) 20 MG tablet Take 1 tablet (20 mg total) by mouth at bedtime. 09/27/16    Clent Demark, PA-C  megestrol (MEGACE) 40 MG tablet Take 2 tablets (80 mg total) by mouth 2 (two) times daily. 11/20/16 12/20/16  Lavonia Drafts, MD  metoCLOPramide (REGLAN) 10 MG tablet Take 1 tablet (10 mg total) by mouth 4 (four) times daily -  before meals and at bedtime. 09/27/16   Clent Demark, PA-C  metoprolol tartrate (LOPRESSOR) 50 MG tablet Take 1 tablet (50 mg total) by mouth 2 (two) times daily. 09/27/16   Clent Demark, PA-C  omeprazole (PRILOSEC) 40 MG capsule Take 1 capsule (40 mg total) by mouth daily. 09/27/16   Clent Demark, PA-C  ondansetron (ZOFRAN ODT) 8 MG disintegrating tablet Take 1 tablet (8 mg total) by mouth every 8 (eight) hours as needed for nausea or vomiting. 11/05/16   [name], MD  promethazine (PHENERGAN) 25 MG tablet Take 1 tablet (25 mg total) by mouth every 8 (eight) hours as needed for nausea or vomiting. 11/18/16 12/02/16  Collier Salina, MD    Family History Family History  Problem Relation Age of Onset  . Diabetes Father     Social History Social History  Substance Use Topics  . Smoking status: Former Smoker    Types: Cigarettes    Quit date: 04/24/1996  . Smokeless tobacco: Never Used     Comment: quit smoking cigarettes age 83  . Alcohol use No     Comment: 04/22/2014 "might have a few drinks a month"     Allergies   Lisinopril and Morphine and related   Review of Systems Review of Systems  Constitutional: Negative for fatigue and fever.  Cardiovascular: Negative for chest pain.  Gastrointestinal: Positive for abdominal pain.  Genitourinary: Positive for flank pain.  Musculoskeletal: Positive for back pain.  Neurological: Negative for dizziness.  All other systems reviewed and are negative.    Physical Exam Updated Vital Signs BP (!) 184/110 (BP Location: Right Arm)   Pulse (!) 118   Temp 98.6 F (37 C) (Oral)   Resp (!) 22   SpO2 98%   Physical Exam  Constitutional: She is oriented to  person, place, and time. She appears well-developed and well-nourished.  Patient appears uncomfortable  HENT:  Head: Normocephalic and atraumatic.  Eyes: Right eye exhibits no discharge. Left eye exhibits no discharge.  Cardiovascular: Normal rate and regular rhythm.   Pulmonary/Chest: Effort normal and breath sounds normal. No respiratory distress. She has no wheezes.  Abdominal: She exhibits no distension. There is tenderness.  Tenderness in the right lower quadrant. Closer to the umbilicus  Neurological: She  is oriented to person, place, and time.  Skin: Skin is warm and dry. She is not diaphoretic.  Psychiatric: She has a normal mood and affect.  Nursing note and vitals reviewed.    ED Treatments / Results  Labs (all labs ordered are listed, but only abnormal results are displayed) Labs Reviewed  COMPREHENSIVE METABOLIC PANEL - Abnormal; Notable for the following:       Result Value   Glucose, Bld 284 (*)    Creatinine, Ser 1.34 (*)    Total Protein 8.4 (*)    Albumin 3.4 (*)    ALT 11 (*)    GFR calc non Af Amer 48 (*)    GFR calc Af Amer 55 (*)    All other components within normal limits  CBC - Abnormal; Notable for the following:    WBC 12.0 (*)    Hemoglobin 10.7 (*)    HCT 31.3 (*)    MCV 76.3 (*)    RDW 18.6 (*)    Platelets 476 (*)    All other components within normal limits  URINALYSIS, ROUTINE W REFLEX MICROSCOPIC - Abnormal; Notable for the following:    Color, Urine STRAW (*)    Glucose, UA >=500 (*)    Hgb urine dipstick LARGE (*)    Protein, ur 100 (*)    Squamous Epithelial / LPF 0-5 (*)    All other components within normal limits  LIPASE, BLOOD  PROTIME-INR  POC URINE PREG, ED  TYPE AND SCREEN    EKG  EKG Interpretation None       Radiology No results found.  Procedures Procedures (including critical care time)  Medications Ordered in ED Medications  ondansetron (ZOFRAN-ODT) 4 MG disintegrating tablet (not administered)    promethazine (PHENERGAN) injection 12.5 mg (not administered)  fentaNYL (SUBLIMAZE) injection 50 mcg (not administered)  sodium chloride 0.9 % bolus 1,000 mL (not administered)  ondansetron (ZOFRAN-ODT) disintegrating tablet 4 mg (4 mg Oral Given 12/15/16 2218)     Initial Impression / Assessment and Plan / ED Course  I have reviewed the triage vital signs and the nursing notes.  Pertinent labs & imaging results that were available during my care of the patient were reviewed by me and considered in my medical decision making (see chart for details).     Patient is a 41 year old female presenting with abdominal pain. Past medical history is very complicated, history of CVA, hypertension hyperlipidemia CAD fibromyalgia migraine, diabetes, gastroparesis. Patient had recent hospital stay and was discharged a couple days ago. Patient was found to have DVT/PE. Started on Eloquis. Patient then had increased vaginal bleeding. She was trialed on by mouth Megace, without full resolution. Patient continued to bleed and therefore had a pelvic artery embolism by interventional radiology.  She said she felt better and then all of a sudden in the last days felt worse. She appears uncomfortable in the emergency department. + nausea  12:40 AM CTA abdomen and pelvis ordered. Concern for complication from IR procedure.  8:24 AM Patient has reassuring labs. Hemoglobin is steady. Patient does not have any evidence of new white count. Patient's CT is reassuring. No evidence of infection.  Was planing to discharge patient since she was feeling better, eating, drinking, walking to the bathroom. However patient's heart rate went up when she went to the bathroom. We'll give her another liter fluid, and her home medications.  Final Clinical Impressions(s) / ED Diagnoses   Final diagnoses:  None    New  Prescriptions New Prescriptions   No medications on file     Macarthur Critchley, MD 12/16/16  [phone]

## 2016-12-16 NOTE — Discharge Instructions (Addendum)
You were seen today for abdominal pain. Your CT was reassuring.Your labs are reassuring.    If your pain cannot be controlled, go to the [facility], for evaluation and treatment by the gynecology service.

## 2016-12-16 NOTE — ED Notes (Signed)
Took over care of patient at this time

## 2016-12-16 NOTE — ED Notes (Signed)
Attempted to obtain IV without success.  Pt states she doesn't want me to try again.  That she is hard to get IV on and was just discharged from the hospital after embolus this past week.  IV team consult requested.

## 2016-12-21 ENCOUNTER — Encounter (HOSPITAL_COMMUNITY): Payer: Self-pay

## 2016-12-21 ENCOUNTER — Emergency Department (HOSPITAL_COMMUNITY)
Admission: EM | Admit: 2016-12-21 | Discharge: 2016-12-21 | Disposition: A | Discharge status: Home / Self Care | Payer: Self-pay | Attending: Physician Assistant | Admitting: Physician Assistant

## 2016-12-21 DIAGNOSIS — I2510 Atherosclerotic heart disease of native coronary artery without angina pectoris: Secondary | ICD-10-CM | POA: Insufficient documentation

## 2016-12-21 DIAGNOSIS — J45909 Unspecified asthma, uncomplicated: Secondary | ICD-10-CM | POA: Insufficient documentation

## 2016-12-21 DIAGNOSIS — Z79899 Other long term (current) drug therapy: Secondary | ICD-10-CM | POA: Insufficient documentation

## 2016-12-21 DIAGNOSIS — Z87891 Personal history of nicotine dependence: Secondary | ICD-10-CM | POA: Insufficient documentation

## 2016-12-21 DIAGNOSIS — R1011 Right upper quadrant pain: Secondary | ICD-10-CM | POA: Insufficient documentation

## 2016-12-21 DIAGNOSIS — I252 Old myocardial infarction: Secondary | ICD-10-CM | POA: Insufficient documentation

## 2016-12-21 DIAGNOSIS — Z7983 Long term (current) use of bisphosphonates: Secondary | ICD-10-CM | POA: Insufficient documentation

## 2016-12-21 DIAGNOSIS — Z7901 Long term (current) use of anticoagulants: Secondary | ICD-10-CM | POA: Insufficient documentation

## 2016-12-21 DIAGNOSIS — E1140 Type 2 diabetes mellitus with diabetic neuropathy, unspecified: Secondary | ICD-10-CM | POA: Insufficient documentation

## 2016-12-21 DIAGNOSIS — E785 Hyperlipidemia, unspecified: Secondary | ICD-10-CM | POA: Insufficient documentation

## 2016-12-21 DIAGNOSIS — Z794 Long term (current) use of insulin: Secondary | ICD-10-CM | POA: Insufficient documentation

## 2016-12-21 DIAGNOSIS — R112 Nausea with vomiting, unspecified: Secondary | ICD-10-CM | POA: Insufficient documentation

## 2016-12-21 DIAGNOSIS — Z8673 Personal history of transient ischemic attack (TIA), and cerebral infarction without residual deficits: Secondary | ICD-10-CM | POA: Insufficient documentation

## 2016-12-21 LAB — CBC
HEMATOCRIT: 31.2 % — AB (ref 36.0–46.0)
Hemoglobin: 10.4 g/dL — ABNORMAL LOW (ref 12.0–15.0)
MCH: 25.8 pg — ABNORMAL LOW (ref 26.0–34.0)
MCHC: 33.3 g/dL (ref 30.0–36.0)
MCV: 77.4 fL — ABNORMAL LOW (ref 78.0–100.0)
PLATELETS: 491 10*3/uL — AB (ref 150–400)
RBC: 4.03 MIL/uL (ref 3.87–5.11)
RDW: 18 % — AB (ref 11.5–15.5)
WBC: 10.9 10*3/uL — AB (ref 4.0–10.5)

## 2016-12-21 LAB — URINALYSIS, ROUTINE W REFLEX MICROSCOPIC
BILIRUBIN URINE: NEGATIVE
Bacteria, UA: NONE SEEN
Ketones, ur: NEGATIVE mg/dL
NITRITE: NEGATIVE
PH: 6 (ref 5.0–8.0)
PROTEIN: 100 mg/dL — AB
SPECIFIC GRAVITY, URINE: 1.015 (ref 1.005–1.030)

## 2016-12-21 LAB — COMPREHENSIVE METABOLIC PANEL
ALK PHOS: 50 U/L (ref 38–126)
ALT: 10 U/L — ABNORMAL LOW (ref 14–54)
AST: 22 U/L (ref 15–41)
Albumin: 3.2 g/dL — ABNORMAL LOW (ref 3.5–5.0)
Anion gap: 12 (ref 5–15)
BILIRUBIN TOTAL: 0.4 mg/dL (ref 0.3–1.2)
BUN: 19 mg/dL (ref 6–20)
CALCIUM: 9.8 mg/dL (ref 8.9–10.3)
CO2: 20 mmol/L — ABNORMAL LOW (ref 22–32)
CREATININE: 1.14 mg/dL — AB (ref 0.44–1.00)
Chloride: 104 mmol/L (ref 101–111)
GFR calc Af Amer: >60 mL/min (ref >60)
GFR, EST NON AFRICAN AMERICAN: 58 mL/min — AB (ref >60)
Glucose, Bld: 338 mg/dL — ABNORMAL HIGH (ref 65–99)
POTASSIUM: 4.1 mmol/L (ref 3.5–5.1)
Sodium: 136 mmol/L (ref 135–145)
TOTAL PROTEIN: 8 g/dL (ref 6.5–8.1)

## 2016-12-21 LAB — LIPASE, BLOOD: Lipase: 51 U/L (ref 11–51)

## 2016-12-21 MED ORDER — FENTANYL CITRATE (PF) 100 MCG/2ML IJ SOLN
50.0000 ug | Freq: Once | INTRAMUSCULAR | Status: AC
  Administered 2016-12-21: 50 ug via INTRAVENOUS
  Filled 2016-12-21: qty 2

## 2016-12-21 MED ORDER — ONDANSETRON 4 MG PO TBDP
4.0000 mg | ORAL_TABLET | Freq: Once | ORAL | Status: AC | PRN
  Administered 2016-12-21: 4 mg via ORAL

## 2016-12-21 MED ORDER — ONDANSETRON 4 MG PO TBDP
ORAL_TABLET | ORAL | Status: AC
  Filled 2016-12-21: qty 1

## 2016-12-21 MED ORDER — HYDROMORPHONE HCL 1 MG/ML IJ SOLN
0.5000 mg | Freq: Once | INTRAMUSCULAR | Status: AC
  Administered 2016-12-21: 0.5 mg via INTRAVENOUS
  Filled 2016-12-21: qty 1

## 2016-12-21 MED ORDER — OXYCODONE-ACETAMINOPHEN 5-325 MG PO TABS: 1.0000 | ORAL_TABLET | Freq: Four times a day (QID) | ORAL | 0 refills | Status: DC | PRN

## 2016-12-21 MED ORDER — PROMETHAZINE HCL 25 MG/ML IJ SOLN
6.2500 mg | Freq: Once | INTRAMUSCULAR | Status: AC
  Administered 2016-12-21: 6.25 mg via INTRAVENOUS
  Filled 2016-12-21: qty 1

## 2016-12-21 MED ORDER — SODIUM CHLORIDE 0.9 % IV BOLUS (SEPSIS)
1000.0000 mL | Freq: Once | INTRAVENOUS | Status: AC
  Administered 2016-12-21: 1000 mL via INTRAVENOUS

## 2016-12-21 MED ORDER — LORAZEPAM 2 MG/ML IJ SOLN
0.5000 mg | Freq: Once | INTRAMUSCULAR | Status: AC
  Administered 2016-12-21: 0.5 mg via INTRAVENOUS
  Filled 2016-12-21: qty 1

## 2016-12-21 NOTE — Discharge Instructions (Signed)
Please read attached information regarding your condition. Continue home medication as previously prescribed. Follow-up results of urine culture.  Take Phenergan as needed for nausea. Return to ED for worsening pain, increased vomiting, lightheadedness, loss of consciousness, blood in stool or blood in vomit.

## 2016-12-21 NOTE — ED Notes (Signed)
PA at bedside.

## 2016-12-21 NOTE — ED Notes (Signed)
Pt ambulated to the restroom without difficulty. Pt gait even/steady.

## 2016-12-21 NOTE — ED Provider Notes (Signed)
[facility] DEPT Provider Note   CSN: [id number] Arrival date & time: 12/21/16  1150     History   Chief Complaint Chief Complaint  Patient presents with  . Abdominal Pain    HPI Shirley [name] is a 41 y.o. female.  HPI Patient, with past medical history of CVA, hypertension, hyperlipidemia, CAD, diabetes presents to ED for evaluation of right-sided abdominal pain and emesis. She states that symptoms have been on and off for the past 2 months but have worsened today. She reports 2 episodes of emesis.  She recently underwent pelvic artery embolization by interventional radiology 10 days ago. She states that her vaginal bleeding has improved since this procedure. She denies any dysuria, vaginal discharge, loss of consciousness.  Past Medical History:  Diagnosis Date  . Abscess of tunica vaginalis    10/09- Abundant S. aureus- sensitive to all abx  . Anxiety   . Blood dyscrasia   . CAD (coronary artery disease) 06/15/2006   s/p Subendocardial MI with PDA angioplasty(no stent) on 06/15/06 and relook  cath 06/19/06 showed patency of site. Cath 12/10- no restenosis or significant CAD progression  . CVA (cerebral vascular accident) ([place]) ~ 02/2014   denies residual on 04/22/2014  . CVA (cerebral vascular accident) [facility])    history of remote right cerebellar infarct noted on head CT at least since 10/2011  . Depression   . Diabetes mellitus type 2, uncontrolled, with complications ([place])   . Fibromyalgia   . Gastritis   . Gastroparesis    secondary to poorly controlled DM, last emptying study performed 01/2010  was normal but may be falsely positive as pt was on reglan  . GERD (gastroesophageal reflux disease)   . Hepatitis B, chronic (HCC)    Hep BeAb+,Hep B cAb+ & Hep BsAg+ (9/06)  . History of pyelonephritis    H/o GrpB Pyelonephritis (9/06) and UTI- 07/11- E.Coli, 12/10- GBS  . Hyperlipidemia   . Hypertension   . Iron deficiency anemia   . Irregular menses    Small  ovarian follicles seen on JF(3/54)  . MI (myocardial infarction) ([place]) 05/2006   PDA percutaneous transluminal coronary angioplasty  . Migraine    "weekly" (04/22/2014)  . N&V (nausea and vomiting)    Chronic. Unclear etiology with multiple admission and ED visits. CT abdomen with and without contrast (02/2011)  showed no acute process. Gastic Emptying scan (01/2010) was normal. Ultrasound of the abdomen was within normal limits. Hepatitis B viral load was undectable. HIV NR. EGD - gastritis, Hpylori + s/p Rx  . Obesity   . OSA (obstructive sleep apnea)    "suppose to wear mask but I don't" (04/22/2014)  . Peripheral neuropathy   . Pneumonia    "this is probably the 2nd or 3rd time I've had pneumonia" (04/22/2014)  . Recurrent boils   . Seasonal asthma   . Substance abuse   . Thrombocytosis ([place])    Hem/Onc suggested 2/2 chronic hepatits and/or iron deficiency anemia    Patient Active Problem List   Diagnosis Date Noted  . Acute blood loss anemia 11/13/2016  . Menorrhagia 11/13/2016  . Bilateral pulmonary embolism ([place]) 10/30/2016  . Acute DVT (deep venous thrombosis) ([place]) 10/30/2016  . Type 2 diabetes mellitus with hyperglycemia ([place]) 04/07/2016  . Diabetic gastroparesis ([place]) 04/06/2016  . Dehydration 03/27/2015  . Gastroparesis 11/11/2014  . Hematemesis 10/13/2014  . DKA (diabetic ketoacidoses) ([place]) 10/13/2014  . Tachycardia 07/21/2014  . Abdominal pain, chronic, left lower quadrant   .  Diabetic gastroparesis associated with type 2 diabetes mellitus ([place]) 04/28/2014  . History of Helicobacter pylori infection 04/22/2014  . Vaginal discharge 02/18/2014  . Atypical chest pain 07/22/2013  . Unspecified constipation 07/21/2013  . Tinea corporis 07/21/2013  . Intractable vomiting 04/29/2013  . Dysmenorrhea 04/22/2013  . UTI (urinary tract infection) 07/15/2012  . Headache(784.0) 02/08/2012  . Health care maintenance 01/22/2012  . Chronic hepatitis B ([place]) 03/07/2011  .  History of leukocytosis 04/06/2010  . THROMBOCYTOSIS 04/06/2010  . Polysubstance abuse 02/23/2010  . Iron deficiency anemia 11/22/2009  . PERIPHERAL NEUROPATHY 10/01/2009  . Hyperlipidemia 08/30/2009  . Diabetic polyneuropathy ([place]) 08/30/2009  . Hidradenitis (recurrent boils) 07/07/2008  . Depression 12/27/2007  . Abdominal pain, left lower quadrant 11/21/2007  . FIBROMYALGIA 10/30/2007  . BACK PAIN 04/01/2007  . OBSTRUCTIVE SLEEP APNEA 01/17/2007  . ANXIETY DEPRESSION 06/27/2006  . Chronic ischemic heart disease 06/15/2006  . OBESITY, MORBID 05/15/2006  . MIGRAINE HEADACHE 05/15/2006  . Asthma 05/15/2006  . Essential hypertension 01/16/2006  . IRREGULAR MENSTRUATION 01/16/2006  . PEDAL EDEMA 01/16/2006  . Poorly controlled type 2 diabetes mellitus with peripheral neuropathy ([place]) 01/16/1989    Past Surgical History:  Procedure Laterality Date  . CESAREAN SECTION  1997  . CORONARY ANGIOPLASTY WITH STENT PLACEMENT  2008   "2 stents"  . ESOPHAGOGASTRODUODENOSCOPY N/A 04/23/2014   Procedure: ESOPHAGOGASTRODUODENOSCOPY (EGD);  Surgeon: Winfield Cunas., MD;  Location[facility] ENDOSCOPY;  Service: Endoscopy;  Laterality: N/A;  . IR ANGIOGRAM PELVIS SELECTIVE OR SUPRASELECTIVE  12/07/2016  . IR ANGIOGRAM PELVIS SELECTIVE OR SUPRASELECTIVE  12/07/2016  . IR ANGIOGRAM SELECTIVE EACH ADDITIONAL VESSEL  12/07/2016  . IR ANGIOGRAM SELECTIVE EACH ADDITIONAL VESSEL  12/07/2016  . IR EMBO ARTERIAL NOT HEMORR HEMANG INC GUIDE ROADMAPPING  12/07/2016  . IR US GUIDE VASC ACCESS RIGHT  12/07/2016    OB History    Gravida Para Term Preterm AB Living   _0 SAB TAB Ectopic Multiple Live Births           2       Home Medications    Prior to Admission medications   Medication Sig Start Date End Date Taking? Authorizing Provider  albuterol (PROVENTIL HFA;VENTOLIN HFA) 108 (90 Base) MCG/ACT inhaler Inhale 2 puffs into the lungs every 4 (four) hours as needed for wheezing or shortness of  breath. Patient not taking: Reported on 11/20/2016 09/27/16   Clent Demark, PA-C  apixaban (ELIQUIS) 5 MG TABS tablet Take 1 tablet (5 mg total) by mouth 2 (two) times daily. 11/18/16   Collier Salina, MD  ferrous sulfate 325 (65 FE) MG tablet Take 1 tablet (325 mg total) by mouth 2 (two) times daily with a meal. 09/05/16   Clent Demark, PA-C  glucose blood (FREESTYLE TEST STRIPS) test strip Use as instructed 09/05/16   Clent Demark, PA-C  glucose monitoring kit (FREESTYLE) monitoring kit 1 each by Does not apply route 4 (four) times daily - after meals and at bedtime. 1 month Diabetic Testing Supplies for QAC-QHS accuchecks. 09/05/16   Clent Demark, PA-C  hydrochlorothiazide (HYDRODIURIL) 25 MG tablet Take 1 tablet (25 mg total) by mouth daily. Take on tablet in the morning. Patient taking differently: Take 25 mg by mouth daily.  09/27/16   Clent Demark, PA-C  HYDROcodone-acetaminophen [facility]) 10-325 MG tablet Take 1 tablet by mouth every 6 (six) hours as needed for moderate pain or severe  pain. 12/11/16   Zada Finders, MD  insulin NPH-regular Human (NOVOLIN 70/30) (70-30) 100 UNIT/ML injection 25 units in the morning and 25 units in the evening. Patient taking differently: Inject 25 Units into the skin 2 (two) times daily with a meal.  09/27/16   Clent Demark, PA-C  INSULIN SYRINGE .5CC/28G (INS SYRINGE/NEEDLE .5CC/28G) 28G X 1/2" 0.5 ML MISC Please provide 1 month supply 09/27/16   Clent Demark, PA-C  Lancets (FREESTYLE) lancets Use as instructed 09/27/16   Clent Demark, PA-C  losartan (COZAAR) 50 MG tablet Take 1 tablet (50 mg total) by mouth daily. 09/27/16   Clent Demark, PA-C  lovastatin (MEVACOR) 20 MG tablet Take 1 tablet (20 mg total) by mouth at bedtime. 09/27/16   Clent Demark, PA-C  metoCLOPramide (REGLAN) 10 MG tablet Take 1 tablet (10 mg total) by mouth 4 (four) times daily -  before meals and at bedtime. 09/27/16   Clent Demark, PA-C    metoprolol tartrate (LOPRESSOR) 50 MG tablet Take 1 tablet (50 mg total) by mouth 2 (two) times daily. 09/27/16   Clent Demark, PA-C  omeprazole (PRILOSEC) 40 MG capsule Take 1 capsule (40 mg total) by mouth daily. 09/27/16   Clent Demark, PA-C  ondansetron (ZOFRAN ODT) 8 MG disintegrating tablet Take 1 tablet (8 mg total) by mouth every 8 (eight) hours as needed for nausea or vomiting. 11/05/16   [name], MD  oxyCODONE-acetaminophen (PERCOCET/ROXICET) 5-325 MG tablet Take 1 tablet by mouth every 6 (six) hours as needed for severe pain. 12/16/16   Mackuen, Courteney Lyn, MD  promethazine (PHENERGAN) 25 MG tablet Take 1 tablet (25 mg total) by mouth every 8 (eight) hours as needed for nausea or vomiting. 11/18/16 12/16/16  Collier Salina, MD  promethazine (PHENERGAN) 25 MG tablet Take 1 tablet (25 mg total) by mouth every 6 (six) hours as needed for nausea or vomiting. 12/16/16   Mackuen, Fredia Sorrow, MD    Family History Family History  Problem Relation Age of Onset  . Diabetes Father     Social History Social History  Substance Use Topics  . Smoking status: Former Smoker    Types: Cigarettes    Quit date: 04/24/1996  . Smokeless tobacco: Never Used     Comment: quit smoking cigarettes age 73  . Alcohol use No     Comment: 04/22/2014 "might have a few drinks a month"     Allergies   Lisinopril and Morphine and related   Review of Systems Review of Systems  Constitutional: Negative for appetite change, chills and fever.  HENT: Negative for ear pain, rhinorrhea, sneezing and sore throat.   Eyes: Negative for photophobia and visual disturbance.  Respiratory: Negative for cough, chest tightness, shortness of breath and wheezing.   Cardiovascular: Negative for chest pain and palpitations.  Gastrointestinal: Positive for abdominal pain, nausea and vomiting. Negative for blood in stool, constipation and diarrhea.  Genitourinary: Negative for dysuria, hematuria and  urgency.  Musculoskeletal: Negative for myalgias.  Skin: Negative for rash.  Neurological: Positive for light-headedness. Negative for dizziness and weakness.     Physical Exam Updated Vital Signs BP (!) 173/85 (BP Location: Right Arm)   Pulse 92   Temp 98.4 F (36.9 C) (Oral)   Resp (!) 22   SpO2 98%   Physical Exam  Constitutional: She appears well-developed and well-nourished. No distress.  Patient appears uncomfortable.  HENT:  Head: Normocephalic and atraumatic.  Nose: Nose normal.  Eyes: Conjunctivae and EOM are normal. Left eye exhibits no discharge. No scleral icterus.  Neck: Normal range of motion. Neck supple.  Cardiovascular: Normal rate, regular rhythm, normal heart sounds and intact distal pulses.  Exam reveals no gallop and no friction rub.   No murmur heard. Pulmonary/Chest: Effort normal and breath sounds normal. No respiratory distress.  Abdominal: Soft. Bowel sounds are normal. She exhibits no distension. There is tenderness. There is no guarding.    Musculoskeletal: Normal range of motion. She exhibits no edema.  Neurological: She is alert. She exhibits normal muscle tone. Coordination normal.  Skin: Skin is warm and dry. No rash noted.  Psychiatric: She has a normal mood and affect.  Nursing note and vitals reviewed.    ED Treatments / Results  Labs (all labs ordered are listed, but only abnormal results are displayed) Labs Reviewed  COMPREHENSIVE METABOLIC PANEL - Abnormal; Notable for the following:       Result Value   CO2 20 (*)    Glucose, Bld 338 (*)    Creatinine, Ser 1.14 (*)    Albumin 3.2 (*)    ALT 10 (*)    GFR calc non Af Amer 58 (*)    All other components within normal limits  CBC - Abnormal; Notable for the following:    WBC 10.9 (*)    Hemoglobin 10.4 (*)    HCT 31.2 (*)    MCV 77.4 (*)    MCH 25.8 (*)    RDW 18.0 (*)    Platelets 491 (*)    All other components within normal limits  URINALYSIS, ROUTINE W REFLEX  MICROSCOPIC - Abnormal; Notable for the following:    APPearance HAZY (*)    Glucose, UA >=500 (*)    Hgb urine dipstick LARGE (*)    Protein, ur 100 (*)    Leukocytes, UA MODERATE (*)    Squamous Epithelial / LPF 6-30 (*)    All other components within normal limits  URINE CULTURE  LIPASE, BLOOD    EKG  EKG Interpretation None       Radiology No results found.  Procedures Procedures (including critical care time)  Medications Ordered in ED Medications  HYDROmorphone (DILAUDID) injection 0.5 mg (not administered)  ondansetron (ZOFRAN-ODT) disintegrating tablet 4 mg (4 mg Oral Given 12/21/16 1326)  fentaNYL (SUBLIMAZE) injection 50 mcg (50 mcg Intravenous Given 12/21/16 1854)  sodium chloride 0.9 % bolus 1,000 mL (1,000 mLs Intravenous New Bag/Given 12/21/16 1853)  promethazine (PHENERGAN) injection 6.25 mg (6.25 mg Intravenous Given 12/21/16 1948)     Initial Impression / Assessment and Plan / ED Course  I have reviewed the triage vital signs and the nursing notes.  Pertinent labs & imaging results that were available during my care of the patient were reviewed by me and considered in my medical decision making (see chart for details).     Patient presents to ED for evaluation of right-sided abdominal pain. Symptoms have been present for the past 2 months however she states that they have worsened today. She also reports 2 episodes of emesis. She does have a history of recent pelvic artery embolization 10 days ago. She states that her vaginal bleeding has improved since this procedure. Patient seen here 5 days ago for similar symptoms with negative workup and improvement in symptoms with fluids, pain control and antiemetics. Here she is afebrile with no history of fever. Initially tachycardic to 120s. Blood pressure elevated to 160s over 90s. Laboratory including CBC  unremarkable with hemoglobin and hematocrit similar to previous findings. CMP unremarkable. Lipase unremarkable.  Urinalysis shows hgb similar to previous, moderate leuks, some WBC. We'll send for culture and we'll treat if positive. Heart rate and blood pressure improved here in the ED with fluids, antiemetics and pain control. Patient resting well in the ED, sleeping but continues to be in pain when she wakes up, asks for Dilaudid. Given 0.5 mg Dilaudid. Chart review shows that patient was given a prescription for Phenergan when she was here 5 days ago. I have low suspicion for acute surgical abdomen being the cause of her symptoms based on duration and improvement here in the ED. Encourage patient to follow up with OB/GYN, PCP for further evaluation. Strict return precautions given.  Final Clinical Impressions(s) / ED Diagnoses   Final diagnoses:  Right upper quadrant abdominal pain    New Prescriptions New Prescriptions   No medications on file      Delia Heady, Hershal Coria 12/21/16 2056    Macarthur Critchley, MD 12/21/16 [phone]

## 2016-12-21 NOTE — ED Notes (Signed)
Pa at bedside speaking to patient

## 2016-12-21 NOTE — ED Notes (Signed)
Patient given graham crackers, cranberry juice, and peanut butter

## 2016-12-21 NOTE — ED Notes (Signed)
PA made aware patient states pain meds have not helped

## 2016-12-21 NOTE — ED Notes (Signed)
Patient actively retching after dilaudid dose, MD made aware

## 2016-12-21 NOTE — ED Notes (Signed)
Patient given additional food per request, a bus pass per request (patient asking for multiples), and taken out front to lobby.

## 2016-12-21 NOTE — ED Notes (Signed)
PA aware patient now awake and asking for Dilaudid for her pain.

## 2016-12-21 NOTE — ED Triage Notes (Signed)
Pt reports RLQ abd pain that began today. She reports emesis as well. Pt reports recent emboli surgery with persistent bleeding.

## 2016-12-23 LAB — URINE CULTURE

## 2016-12-24 ENCOUNTER — Emergency Department (HOSPITAL_COMMUNITY): Payer: Self-pay

## 2016-12-24 ENCOUNTER — Encounter: Payer: Self-pay | Admitting: Emergency Medicine

## 2016-12-24 ENCOUNTER — Emergency Department (HOSPITAL_COMMUNITY)
Admission: EM | Admit: 2016-12-24 | Discharge: 2016-12-24 | Disposition: A | Discharge status: Home / Self Care | Payer: Self-pay | Attending: Emergency Medicine | Admitting: Emergency Medicine

## 2016-12-24 DIAGNOSIS — R1011 Right upper quadrant pain: Secondary | ICD-10-CM | POA: Insufficient documentation

## 2016-12-24 DIAGNOSIS — E119 Type 2 diabetes mellitus without complications: Secondary | ICD-10-CM | POA: Insufficient documentation

## 2016-12-24 DIAGNOSIS — Z79899 Other long term (current) drug therapy: Secondary | ICD-10-CM | POA: Insufficient documentation

## 2016-12-24 DIAGNOSIS — Z8673 Personal history of transient ischemic attack (TIA), and cerebral infarction without residual deficits: Secondary | ICD-10-CM | POA: Insufficient documentation

## 2016-12-24 DIAGNOSIS — J45909 Unspecified asthma, uncomplicated: Secondary | ICD-10-CM | POA: Insufficient documentation

## 2016-12-24 DIAGNOSIS — Z955 Presence of coronary angioplasty implant and graft: Secondary | ICD-10-CM | POA: Insufficient documentation

## 2016-12-24 DIAGNOSIS — R109 Unspecified abdominal pain: Secondary | ICD-10-CM

## 2016-12-24 DIAGNOSIS — I252 Old myocardial infarction: Secondary | ICD-10-CM | POA: Insufficient documentation

## 2016-12-24 DIAGNOSIS — I10 Essential (primary) hypertension: Secondary | ICD-10-CM | POA: Insufficient documentation

## 2016-12-24 DIAGNOSIS — Z87891 Personal history of nicotine dependence: Secondary | ICD-10-CM | POA: Insufficient documentation

## 2016-12-24 DIAGNOSIS — Z7901 Long term (current) use of anticoagulants: Secondary | ICD-10-CM | POA: Insufficient documentation

## 2016-12-24 DIAGNOSIS — R1115 Cyclical vomiting syndrome unrelated to migraine: Secondary | ICD-10-CM

## 2016-12-24 DIAGNOSIS — I2510 Atherosclerotic heart disease of native coronary artery without angina pectoris: Secondary | ICD-10-CM | POA: Insufficient documentation

## 2016-12-24 DIAGNOSIS — Z794 Long term (current) use of insulin: Secondary | ICD-10-CM | POA: Insufficient documentation

## 2016-12-24 DIAGNOSIS — G43A0 Cyclical vomiting, not intractable: Secondary | ICD-10-CM | POA: Insufficient documentation

## 2016-12-24 LAB — COMPREHENSIVE METABOLIC PANEL
ALBUMIN: 3.3 g/dL — AB (ref 3.5–5.0)
ALT: 10 U/L — ABNORMAL LOW (ref 14–54)
ANION GAP: 7 (ref 5–15)
AST: 16 U/L (ref 15–41)
Alkaline Phosphatase: 51 U/L (ref 38–126)
BILIRUBIN TOTAL: 0.3 mg/dL (ref 0.3–1.2)
BUN: 13 mg/dL (ref 6–20)
CO2: 23 mmol/L (ref 22–32)
Calcium: 9.5 mg/dL (ref 8.9–10.3)
Chloride: 103 mmol/L (ref 101–111)
Creatinine, Ser: 1.06 mg/dL — ABNORMAL HIGH (ref 0.44–1.00)
GFR calc Af Amer: >60 mL/min (ref >60)
GLUCOSE: 285 mg/dL — AB (ref 65–99)
POTASSIUM: 3.8 mmol/L (ref 3.5–5.1)
Sodium: 133 mmol/L — ABNORMAL LOW (ref 135–145)
TOTAL PROTEIN: 7.9 g/dL (ref 6.5–8.1)

## 2016-12-24 LAB — CBC
HEMATOCRIT: 31.6 % — AB (ref 36.0–46.0)
HEMOGLOBIN: 11 g/dL — AB (ref 12.0–15.0)
MCH: 26.4 pg (ref 26.0–34.0)
MCHC: 34.8 g/dL (ref 30.0–36.0)
MCV: 75.8 fL — ABNORMAL LOW (ref 78.0–100.0)
Platelets: 560 10*3/uL — ABNORMAL HIGH (ref 150–400)
RBC: 4.17 MIL/uL (ref 3.87–5.11)
RDW: 17.2 % — ABNORMAL HIGH (ref 11.5–15.5)
WBC: 11.9 10*3/uL — AB (ref 4.0–10.5)

## 2016-12-24 LAB — CBG MONITORING, ED: Glucose-Capillary: 282 mg/dL — ABNORMAL HIGH (ref 65–99)

## 2016-12-24 LAB — URINALYSIS, ROUTINE W REFLEX MICROSCOPIC
Bilirubin Urine: NEGATIVE
KETONES UR: NEGATIVE mg/dL
LEUKOCYTES UA: NEGATIVE
NITRITE: NEGATIVE
PROTEIN: 100 mg/dL — AB
Specific Gravity, Urine: 1.032 — ABNORMAL HIGH (ref 1.005–1.030)
pH: 5 (ref 5.0–8.0)

## 2016-12-24 LAB — LIPASE, BLOOD: LIPASE: 32 U/L (ref 11–51)

## 2016-12-24 LAB — PREGNANCY, URINE: Preg Test, Ur: NEGATIVE

## 2016-12-24 MED ORDER — FENTANYL CITRATE (PF) 100 MCG/2ML IJ SOLN
75.0000 ug | Freq: Once | INTRAMUSCULAR | Status: AC
  Administered 2016-12-24: 75 ug via INTRAVENOUS
  Filled 2016-12-24: qty 2

## 2016-12-24 MED ORDER — ONDANSETRON 8 MG PO TBDP: 8.0000 mg | ORAL_TABLET | Freq: Three times a day (TID) | ORAL | 0 refills | Status: DC | PRN

## 2016-12-24 MED ORDER — IOPAMIDOL (ISOVUE-300) INJECTION 61%
INTRAVENOUS | Status: AC
  Administered 2016-12-24: 100 mL
  Filled 2016-12-24: qty 100

## 2016-12-24 MED ORDER — OXYCODONE-ACETAMINOPHEN 5-325 MG PO TABS
1.0000 | ORAL_TABLET | Freq: Once | ORAL | Status: AC
  Administered 2016-12-24: 1 via ORAL
  Filled 2016-12-24: qty 1

## 2016-12-24 MED ORDER — METOCLOPRAMIDE HCL 5 MG/ML IJ SOLN
10.0000 mg | Freq: Once | INTRAMUSCULAR | Status: AC
  Administered 2016-12-24: 10 mg via INTRAVENOUS
  Filled 2016-12-24: qty 2

## 2016-12-24 MED ORDER — SODIUM CHLORIDE 0.9 % IV BOLUS (SEPSIS)
1000.0000 mL | Freq: Once | INTRAVENOUS | Status: AC
  Administered 2016-12-24: 1000 mL via INTRAVENOUS

## 2016-12-24 MED ORDER — ONDANSETRON 4 MG PO TBDP: 4.0000 mg | ORAL_TABLET | Freq: Three times a day (TID) | ORAL | 0 refills | Status: AC | PRN

## 2016-12-24 NOTE — ED Triage Notes (Signed)
Patient arrives with complaint of vomiting and abdominal pain for 2 days. Also states she is diabetic and hasn't been taking any of her medications. Last insulin was 2-3 days ago. Additionally endorses dizziness and lightheadedness. Explains that pain is related to a uterine emboli.

## 2016-12-24 NOTE — ED Provider Notes (Signed)
[facility] DEPT Provider Note   CSN: [id number] Arrival date & time: 12/24/16  0100     History   Chief Complaint Chief Complaint  Patient presents with  . Abdominal Pain  . Emesis    HPI Shirley [name] is a 41 y.o. female.  The history is provided by the patient.  Abdominal Pain   This is a new problem. Episode onset: 2 weeks. The problem occurs constantly. Associated with: Uterine fibroid embolization on 8/16. Been having cramping since. The pain is located in the suprapubic region. The quality of the pain is cramping. The pain is moderate. Associated symptoms include nausea and vomiting. Pertinent negatives include anorexia, fever, diarrhea, dysuria and hematuria. Nothing aggravates the symptoms. Nothing relieves the symptoms.  Emesis   Associated symptoms include abdominal pain. Pertinent negatives include no diarrhea and no fever.     Past Medical History:  Diagnosis Date  . Abscess of tunica vaginalis    10/09- Abundant S. aureus- sensitive to all abx  . Anxiety   . Blood dyscrasia   . CAD (coronary artery disease) 06/15/2006   s/p Subendocardial MI with PDA angioplasty(no stent) on 06/15/06 and relook  cath 06/19/06 showed patency of site. Cath 12/10- no restenosis or significant CAD progression  . CVA (cerebral vascular accident) ([place]) ~ 02/2014   denies residual on 04/22/2014  . CVA (cerebral vascular accident) [facility])    history of remote right cerebellar infarct noted on head CT at least since 10/2011  . Depression   . Diabetes mellitus type 2, uncontrolled, with complications ([place])   . Fibromyalgia   . Gastritis   . Gastroparesis    secondary to poorly controlled DM, last emptying study performed 01/2010  was normal but may be falsely positive as pt was on reglan  . GERD (gastroesophageal reflux disease)   . Hepatitis B, chronic (HCC)    Hep BeAb+,Hep B cAb+ & Hep BsAg+ (9/06)  . History of pyelonephritis    H/o GrpB Pyelonephritis (9/06) and UTI- 07/11-  E.Coli, 12/10- GBS  . Hyperlipidemia   . Hypertension   . Iron deficiency anemia   . Irregular menses    Small ovarian follicles seen on GG(2/69)  . MI (myocardial infarction) ([place]) 05/2006   PDA percutaneous transluminal coronary angioplasty  . Migraine    "weekly" (04/22/2014)  . N&V (nausea and vomiting)    Chronic. Unclear etiology with multiple admission and ED visits. CT abdomen with and without contrast (02/2011)  showed no acute process. Gastic Emptying scan (01/2010) was normal. Ultrasound of the abdomen was within normal limits. Hepatitis B viral load was undectable. HIV NR. EGD - gastritis, Hpylori + s/p Rx  . Obesity   . OSA (obstructive sleep apnea)    "suppose to wear mask but I don't" (04/22/2014)  . Peripheral neuropathy   . Pneumonia    "this is probably the 2nd or 3rd time I've had pneumonia" (04/22/2014)  . Recurrent boils   . Seasonal asthma   . Substance abuse   . Thrombocytosis ([place])    Hem/Onc suggested 2/2 chronic hepatits and/or iron deficiency anemia    Patient Active Problem List   Diagnosis Date Noted  . Acute blood loss anemia 11/13/2016  . Menorrhagia 11/13/2016  . Bilateral pulmonary embolism ([place]) 10/30/2016  . Acute DVT (deep venous thrombosis) ([place]) 10/30/2016  . Type 2 diabetes mellitus with hyperglycemia ([place]) 04/07/2016  . Diabetic gastroparesis ([place]) 04/06/2016  . Dehydration 03/27/2015  . Gastroparesis 11/11/2014  . Hematemesis 10/13/2014  .  DKA (diabetic ketoacidoses) ([place]) 10/13/2014  . Tachycardia 07/21/2014  . Abdominal pain, chronic, left lower quadrant   . Diabetic gastroparesis associated with type 2 diabetes mellitus ([place]) 04/28/2014  . History of Helicobacter pylori infection 04/22/2014  . Vaginal discharge 02/18/2014  . Atypical chest pain 07/22/2013  . Unspecified constipation 07/21/2013  . Tinea corporis 07/21/2013  . Intractable vomiting 04/29/2013  . Dysmenorrhea 04/22/2013  . UTI (urinary tract infection) 07/15/2012    . Headache(784.0) 02/08/2012  . Health care maintenance 01/22/2012  . Chronic hepatitis B ([place]) 03/07/2011  . History of leukocytosis 04/06/2010  . THROMBOCYTOSIS 04/06/2010  . Polysubstance abuse 02/23/2010  . Iron deficiency anemia 11/22/2009  . PERIPHERAL NEUROPATHY 10/01/2009  . Hyperlipidemia 08/30/2009  . Diabetic polyneuropathy ([place]) 08/30/2009  . Hidradenitis (recurrent boils) 07/07/2008  . Depression 12/27/2007  . Abdominal pain, left lower quadrant 11/21/2007  . FIBROMYALGIA 10/30/2007  . BACK PAIN 04/01/2007  . OBSTRUCTIVE SLEEP APNEA 01/17/2007  . ANXIETY DEPRESSION 06/27/2006  . Chronic ischemic heart disease 06/15/2006  . OBESITY, MORBID 05/15/2006  . MIGRAINE HEADACHE 05/15/2006  . Asthma 05/15/2006  . Essential hypertension 01/16/2006  . IRREGULAR MENSTRUATION 01/16/2006  . PEDAL EDEMA 01/16/2006  . Poorly controlled type 2 diabetes mellitus with peripheral neuropathy ([place]) 01/16/1989    Past Surgical History:  Procedure Laterality Date  . CESAREAN SECTION  1997  . CORONARY ANGIOPLASTY WITH STENT PLACEMENT  2008   "2 stents"  . ESOPHAGOGASTRODUODENOSCOPY N/A 04/23/2014   Procedure: ESOPHAGOGASTRODUODENOSCOPY (EGD);  Surgeon: Winfield Cunas., MD;  Location: [facility] ENDOSCOPY;  Service: Endoscopy;  Laterality: N/A;  . IR ANGIOGRAM PELVIS SELECTIVE OR SUPRASELECTIVE  12/07/2016  . IR ANGIOGRAM PELVIS SELECTIVE OR SUPRASELECTIVE  12/07/2016  . IR ANGIOGRAM SELECTIVE EACH ADDITIONAL VESSEL  12/07/2016  . IR ANGIOGRAM SELECTIVE EACH ADDITIONAL VESSEL  12/07/2016  . IR EMBO ARTERIAL NOT HEMORR HEMANG INC GUIDE ROADMAPPING  12/07/2016  . IR US GUIDE VASC ACCESS RIGHT  12/07/2016    OB History    Gravida Para Term Preterm AB Living   2 2 2          SAB TAB Ectopic Multiple Live Births           2       Home Medications    Prior to Admission medications   Medication Sig Start Date End Date Taking? Authorizing Provider  albuterol (PROVENTIL HFA;VENTOLIN HFA) 108  (90 Base) MCG/ACT inhaler Inhale 2 puffs into the lungs every 4 (four) hours as needed for wheezing or shortness of breath. 09/27/16  Yes Clent Demark, PA-C  apixaban (ELIQUIS) 5 MG TABS tablet Take 1 tablet (5 mg total) by mouth 2 (two) times daily. 11/18/16  Yes Rice, Resa Miner, MD  ferrous sulfate 325 (65 FE) MG tablet Take 1 tablet (325 mg total) by mouth 2 (two) times daily with a meal. 09/05/16  Yes Clent Demark, PA-C  glucose blood (FREESTYLE TEST STRIPS) test strip Use as instructed 09/05/16  Yes Clent Demark, PA-C  glucose monitoring kit (FREESTYLE) monitoring kit 1 each by Does not apply route 4 (four) times daily - after meals and at bedtime. 1 month Diabetic Testing Supplies for QAC-QHS accuchecks. 09/05/16  Yes Clent Demark, PA-C  hydrochlorothiazide (HYDRODIURIL) 25 MG tablet Take 1 tablet (25 mg total) by mouth daily. Take on tablet in the morning. Patient taking differently: Take 25 mg by mouth daily.  09/27/16  Yes Clent Demark, PA-C  HYDROcodone-acetaminophen [facility]) 10-325 MG tablet Take  1 tablet by mouth every 6 (six) hours as needed for moderate pain or severe pain. 12/11/16  Yes Zada Finders, MD  insulin NPH-regular Human (NOVOLIN 70/30) (70-30) 100 UNIT/ML injection 25 units in the morning and 25 units in the evening. Patient taking differently: Inject 25 Units into the skin 2 (two) times daily with a meal.  09/27/16  Yes Clent Demark, PA-C  INSULIN SYRINGE .5CC/28G (INS SYRINGE/NEEDLE .5CC/28G) 28G X 1/2" 0.5 ML MISC Please provide 1 month supply 09/27/16  Yes Clent Demark, PA-C  Lancets (FREESTYLE) lancets Use as instructed 09/27/16  Yes Clent Demark, PA-C  losartan (COZAAR) 50 MG tablet Take 1 tablet (50 mg total) by mouth daily. 09/27/16  Yes Clent Demark, PA-C  lovastatin (MEVACOR) 20 MG tablet Take 1 tablet (20 mg total) by mouth at bedtime. 09/27/16  Yes Clent Demark, PA-C  metoCLOPramide (REGLAN) 10 MG tablet Take 1 tablet (10  mg total) by mouth 4 (four) times daily -  before meals and at bedtime. 09/27/16  Yes Clent Demark, PA-C  metoprolol tartrate (LOPRESSOR) 50 MG tablet Take 1 tablet (50 mg total) by mouth 2 (two) times daily. 09/27/16  Yes Clent Demark, PA-C  omeprazole (PRILOSEC) 40 MG capsule Take 1 capsule (40 mg total) by mouth daily. 09/27/16  Yes Clent Demark, PA-C  oxyCODONE-acetaminophen (PERCOCET/ROXICET) 5-325 MG tablet Take 1 tablet by mouth every 6 (six) hours as needed for severe pain. 12/21/16  Yes Mackuen, Courteney Lyn, MD  promethazine (PHENERGAN) 25 MG tablet Take 1 tablet (25 mg total) by mouth every 6 (six) hours as needed for nausea or vomiting. 12/16/16  Yes Mackuen, Courteney Lyn, MD  ondansetron (ZOFRAN ODT) 4 MG disintegrating tablet Take 1 tablet (4 mg total) by mouth every 8 (eight) hours as needed for nausea or vomiting. 12/24/16 12/27/16  Fatima Blank, MD    Family History Family History  Problem Relation Age of Onset  . Diabetes Father     Social History Social History  Substance Use Topics  . Smoking status: Former Smoker    Types: Cigarettes    Quit date: 04/24/1996  . Smokeless tobacco: Never Used     Comment: quit smoking cigarettes age 27  . Alcohol use No     Comment: 04/22/2014 "might have a few drinks a month"     Allergies   Lisinopril and Morphine and related   Review of Systems Review of Systems  Constitutional: Negative for fever.  Gastrointestinal: Positive for abdominal pain, nausea and vomiting. Negative for anorexia and diarrhea.  Genitourinary: Negative for dysuria and hematuria.  All other systems are reviewed and are negative for acute change except as noted in the HPI    Physical Exam Updated Vital Signs BP (!) 149/106 (BP Location: Left Arm)   Pulse (!) 118   Temp 98.7 F (37.1 C) (Oral)   Resp 18   SpO2 100%   Physical Exam  Constitutional: She is oriented to person, place, and time. She appears well-developed and  well-nourished. No distress.  HENT:  Head: Normocephalic and atraumatic.  Nose: Nose normal.  Eyes: Pupils are equal, round, and reactive to light. Conjunctivae and EOM are normal. Right eye exhibits no discharge. Left eye exhibits no discharge. No scleral icterus.  Neck: Normal range of motion. Neck supple.  Cardiovascular: Normal rate and regular rhythm.  Exam reveals no gallop and no friction rub.   No murmur heard. Pulmonary/Chest: Effort normal and breath sounds normal.  No stridor. No respiratory distress. She has no rales.  Abdominal: Soft. She exhibits no distension. There is tenderness in the right upper quadrant and right lower quadrant. There is tenderness at McBurney's point. There is no rigidity, no rebound, no guarding, no CVA tenderness and negative Murphy's sign.  +psoas and obturators  Musculoskeletal: She exhibits no edema or tenderness.  Neurological: She is alert and oriented to person, place, and time.  Skin: Skin is warm and dry. No rash noted. She is not diaphoretic. No erythema.  Psychiatric: She has a normal mood and affect.  Vitals reviewed.    ED Treatments / Results  Labs (all labs ordered are listed, but only abnormal results are displayed) Labs Reviewed  COMPREHENSIVE METABOLIC PANEL - Abnormal; Notable for the following:       Result Value   Sodium 133 (*)    Glucose, Bld 285 (*)    Creatinine, Ser 1.06 (*)    Albumin 3.3 (*)    ALT 10 (*)    All other components within normal limits  CBC - Abnormal; Notable for the following:    WBC 11.9 (*)    Hemoglobin 11.0 (*)    HCT 31.6 (*)    MCV 75.8 (*)    RDW 17.2 (*)    Platelets 560 (*)    All other components within normal limits  URINALYSIS, ROUTINE W REFLEX MICROSCOPIC - Abnormal; Notable for the following:    Color, Urine AMBER (*)    APPearance CLOUDY (*)    Specific Gravity, Urine 1.032 (*)    Glucose, UA >=500 (*)    Hgb urine dipstick LARGE (*)    Protein, ur 100 (*)    Bacteria, UA RARE  (*)    Squamous Epithelial / LPF 6-30 (*)    All other components within normal limits  CBG MONITORING, ED - Abnormal; Notable for the following:    Glucose-Capillary 282 (*)    All other components within normal limits  LIPASE, BLOOD  PREGNANCY, URINE    EKG  EKG Interpretation  Date/Time:  Sunday December 24 2016 02:13:50 EDT Ventricular Rate:  110 PR Interval:    QRS Duration: 76 QT Interval:  331 QTC Calculation: 448 R Axis:   68 Text Interpretation:  Sinus tachycardia Atrial premature complex Left atrial enlargement no STEMI Otherwise no significant change Confirmed by Addison Lank [phone]) on 12/24/2016 3:43:15 AM       Radiology Ct Abdomen Pelvis W Contrast  Result Date: 12/24/2016 CLINICAL DATA:  41 y/o F; abdominal pain, nausea, and acute vomiting. EXAM: CT ABDOMEN AND PELVIS WITH CONTRAST TECHNIQUE: Multidetector CT imaging of the abdomen and pelvis was performed using the standard protocol following bolus administration of intravenous contrast. CONTRAST:  100 cc Isovue-300 COMPARISON:  12/16/2016 CT abdomen and pelvis. FINDINGS: Lower chest: No acute abnormality. Hepatobiliary: No focal liver abnormality is seen. No gallstones, gallbladder wall thickening, or biliary dilatation. Pancreas: Unremarkable. No pancreatic ductal dilatation or surrounding inflammatory changes. Spleen: Normal in size without focal abnormality. Adrenals/Urinary Tract: Adrenal glands are unremarkable. Kidneys are normal, without renal calculi, focal lesion, or hydronephrosis. Bladder is unremarkable. Stomach/Bowel: Stomach is within normal limits. Appendix appears normal. No evidence of bowel wall thickening, distention, or inflammatory changes. Vascular/Lymphatic: No significant vascular findings are present. No enlarged abdominal or pelvic lymph nodes. Reproductive: Interval hypoattenuation of 6 cm exophytic uterine fibroma compatible with necrosis and posttreatment changes. No adnexal mass identified.  Other: No abdominal wall hernia or abnormality. No abdominopelvic ascites.  Musculoskeletal: No fracture is seen. The degenerative changes the spine predominantly at the L5-S1 level were disc and facet disease results in moderate bilateral foraminal stenosis and canal stenosis. IMPRESSION: 1. Interval central hypoattenuation of large exophytic uterine fibroid compatible with necrosis and posttreatment changes. 2. Otherwise unremarkable CT of the abdomen and pelvis. Electronically Signed   By: Kristine Garbe M.D.   On: 12/24/2016 04:43    Procedures Procedures (including critical care time)  Medications Ordered in ED Medications  sodium chloride 0.9 % bolus 1,000 mL (1,000 mLs Intravenous New Bag/Given 12/24/16 0226)  fentaNYL (SUBLIMAZE) injection 75 mcg (75 mcg Intravenous Given 12/24/16 0226)  metoCLOPramide (REGLAN) injection 10 mg (10 mg Intravenous Given 12/24/16 0226)  iopamidol (ISOVUE-300) 61 % injection (100 mLs  Contrast Given 12/24/16 0416)  oxyCODONE-acetaminophen (PERCOCET/ROXICET) 5-325 MG per tablet 1 tablet (1 tablet Oral Given 12/24/16 0542)     Initial Impression / Assessment and Plan / ED Course  I have reviewed the triage vital signs and the nursing notes.  Pertinent labs & imaging results that were available during my care of the patient were reviewed by me and considered in my medical decision making (see chart for details).     Right-sided abdominal pain with associated nausea and vomiting and decreased by mouth tolerance. Labs with mild leukocytosis. Also notable for hyperglycemia without evidence of DKA or HHS. No biliary obstruction or evidence of pancreatitis. CT without acute process. Patient treated symptomatically. Able to tolerate by mouth.  Patient requesting short course of narcotic medicines to bridge her until she is able to follow-up with her primary care doctor and surgeon. Review of the [place] narcotic database revealed that the patient has filled  123 pills of narcotic medication since August 21. Informed of the concerning heavy narcotic use and informed that we will not provide a prescription for narcotics here.  The patient is safe for discharge with strict return precautions.   Final Clinical Impressions(s) / ED Diagnoses   Final diagnoses:  Non-intractable cyclical vomiting with nausea  Right lateral abdominal pain   Disposition: Discharge  Condition: Good  I have discussed the results, Dx and Tx plan with the patient who expressed understanding and agree(s) with the plan. Discharge instructions discussed at great length. The patient was given strict return precautions who verbalized understanding of the instructions. No further questions at time of discharge.    New Prescriptions   ONDANSETRON (ZOFRAN ODT) 4 MG DISINTEGRATING TABLET    Take 1 tablet (4 mg total) by mouth every 8 (eight) hours as needed for nausea or vomiting.    Follow Up: Clent Demark, [place] [id number] [phone]  Schedule an appointment as soon as possible for a visit  As needed      Sahvannah Rieser, Grayce Sessions, MD 12/24/16 [phone]

## 2016-12-24 NOTE — ED Notes (Signed)
CBG 282 

## 2016-12-26 ENCOUNTER — Emergency Department (HOSPITAL_COMMUNITY)
Admission: EM | Admit: 2016-12-26 | Discharge: 2016-12-26 | Disposition: A | Discharge status: Home / Self Care | Payer: Medicaid Other | Attending: Emergency Medicine | Admitting: Emergency Medicine

## 2016-12-26 ENCOUNTER — Encounter (HOSPITAL_COMMUNITY): Payer: Self-pay | Admitting: Emergency Medicine

## 2016-12-26 DIAGNOSIS — Z7901 Long term (current) use of anticoagulants: Secondary | ICD-10-CM | POA: Insufficient documentation

## 2016-12-26 DIAGNOSIS — R109 Unspecified abdominal pain: Secondary | ICD-10-CM

## 2016-12-26 DIAGNOSIS — Z794 Long term (current) use of insulin: Secondary | ICD-10-CM | POA: Insufficient documentation

## 2016-12-26 DIAGNOSIS — I2510 Atherosclerotic heart disease of native coronary artery without angina pectoris: Secondary | ICD-10-CM | POA: Insufficient documentation

## 2016-12-26 DIAGNOSIS — R1030 Lower abdominal pain, unspecified: Secondary | ICD-10-CM | POA: Insufficient documentation

## 2016-12-26 DIAGNOSIS — R1115 Cyclical vomiting syndrome unrelated to migraine: Secondary | ICD-10-CM

## 2016-12-26 DIAGNOSIS — I10 Essential (primary) hypertension: Secondary | ICD-10-CM | POA: Insufficient documentation

## 2016-12-26 DIAGNOSIS — I252 Old myocardial infarction: Secondary | ICD-10-CM | POA: Insufficient documentation

## 2016-12-26 DIAGNOSIS — E1143 Type 2 diabetes mellitus with diabetic autonomic (poly)neuropathy: Secondary | ICD-10-CM

## 2016-12-26 DIAGNOSIS — Z87891 Personal history of nicotine dependence: Secondary | ICD-10-CM | POA: Insufficient documentation

## 2016-12-26 DIAGNOSIS — R112 Nausea with vomiting, unspecified: Secondary | ICD-10-CM | POA: Insufficient documentation

## 2016-12-26 DIAGNOSIS — K3184 Gastroparesis: Secondary | ICD-10-CM

## 2016-12-26 DIAGNOSIS — J45909 Unspecified asthma, uncomplicated: Secondary | ICD-10-CM | POA: Insufficient documentation

## 2016-12-26 DIAGNOSIS — Z79899 Other long term (current) drug therapy: Secondary | ICD-10-CM | POA: Insufficient documentation

## 2016-12-26 DIAGNOSIS — E119 Type 2 diabetes mellitus without complications: Secondary | ICD-10-CM | POA: Insufficient documentation

## 2016-12-26 LAB — URINALYSIS, ROUTINE W REFLEX MICROSCOPIC
BILIRUBIN URINE: NEGATIVE
Bacteria, UA: NONE SEEN
Ketones, ur: NEGATIVE mg/dL
Leukocytes, UA: NEGATIVE
NITRITE: NEGATIVE
PH: 7 (ref 5.0–8.0)
Protein, ur: 30 mg/dL — AB
SPECIFIC GRAVITY, URINE: 1.007 (ref 1.005–1.030)

## 2016-12-26 LAB — COMPREHENSIVE METABOLIC PANEL
ALBUMIN: 3.5 g/dL (ref 3.5–5.0)
ALK PHOS: 47 U/L (ref 38–126)
ALT: 9 U/L — ABNORMAL LOW (ref 14–54)
ANION GAP: 9 (ref 5–15)
AST: 17 U/L (ref 15–41)
BILIRUBIN TOTAL: 0.5 mg/dL (ref 0.3–1.2)
BUN: 15 mg/dL (ref 6–20)
CALCIUM: 9.7 mg/dL (ref 8.9–10.3)
CO2: 20 mmol/L — ABNORMAL LOW (ref 22–32)
Chloride: 106 mmol/L (ref 101–111)
Creatinine, Ser: 1.03 mg/dL — ABNORMAL HIGH (ref 0.44–1.00)
GLUCOSE: 290 mg/dL — AB (ref 65–99)
Potassium: 3.9 mmol/L (ref 3.5–5.1)
Sodium: 135 mmol/L (ref 135–145)
TOTAL PROTEIN: 8.3 g/dL — AB (ref 6.5–8.1)

## 2016-12-26 LAB — LIPASE, BLOOD: Lipase: 39 U/L (ref 11–51)

## 2016-12-26 LAB — POC URINE PREG, ED: Preg Test, Ur: NEGATIVE

## 2016-12-26 LAB — CBC
HCT: 32.5 % — ABNORMAL LOW (ref 36.0–46.0)
HEMOGLOBIN: 11.1 g/dL — AB (ref 12.0–15.0)
MCH: 25.9 pg — ABNORMAL LOW (ref 26.0–34.0)
MCHC: 34.2 g/dL (ref 30.0–36.0)
MCV: 75.8 fL — ABNORMAL LOW (ref 78.0–100.0)
Platelets: 531 10*3/uL — ABNORMAL HIGH (ref 150–400)
RBC: 4.29 MIL/uL (ref 3.87–5.11)
RDW: 17.4 % — ABNORMAL HIGH (ref 11.5–15.5)
WBC: 10.2 10*3/uL (ref 4.0–10.5)

## 2016-12-26 MED ORDER — ONDANSETRON 4 MG PO TBDP
ORAL_TABLET | ORAL | Status: AC
  Filled 2016-12-26: qty 1

## 2016-12-26 MED ORDER — HALOPERIDOL LACTATE 5 MG/ML IJ SOLN
1.0000 mg | Freq: Once | INTRAMUSCULAR | Status: AC
  Administered 2016-12-26: 1 mg via INTRAVENOUS
  Filled 2016-12-26: qty 1

## 2016-12-26 MED ORDER — METOCLOPRAMIDE HCL 10 MG PO TABS: 10.0000 mg | ORAL_TABLET | Freq: Three times a day (TID) | ORAL | 1 refills | Status: DC

## 2016-12-26 MED ORDER — PROMETHAZINE HCL 50 MG RE SUPP
50.0000 mg | Freq: Four times a day (QID) | RECTAL | Status: DC | PRN
  Filled 2016-12-26: qty 1

## 2016-12-26 MED ORDER — PROMETHAZINE HCL 25 MG PO TABS
12.5000 mg | ORAL_TABLET | Freq: Once | ORAL | Status: AC
  Administered 2016-12-26: 12.5 mg via ORAL
  Filled 2016-12-26: qty 1

## 2016-12-26 MED ORDER — FENTANYL CITRATE (PF) 100 MCG/2ML IJ SOLN
50.0000 ug | Freq: Once | INTRAMUSCULAR | Status: AC
  Administered 2016-12-26: 50 ug via INTRAVENOUS
  Filled 2016-12-26: qty 2

## 2016-12-26 MED ORDER — PROMETHAZINE HCL 25 MG PO TABS
25.0000 mg | ORAL_TABLET | Freq: Once | ORAL | Status: AC
  Administered 2016-12-26: 25 mg via ORAL
  Filled 2016-12-26: qty 1

## 2016-12-26 MED ORDER — SODIUM CHLORIDE 0.9 % IV BOLUS (SEPSIS)
500.0000 mL | Freq: Once | INTRAVENOUS | Status: AC
  Administered 2016-12-26: 500 mL via INTRAVENOUS

## 2016-12-26 MED ORDER — ONDANSETRON 4 MG PO TBDP
4.0000 mg | ORAL_TABLET | Freq: Once | ORAL | Status: AC | PRN
  Administered 2016-12-26: 4 mg via ORAL

## 2016-12-26 MED ORDER — ONDANSETRON HCL 4 MG/2ML IJ SOLN: 4.0000 mg | Freq: Once | INTRAMUSCULAR | Status: DC

## 2016-12-26 NOTE — ED Notes (Signed)
Pt took a couple of sips of water and went straight to bed.  No complaints of N/V

## 2016-12-26 NOTE — ED Triage Notes (Signed)
Pt states she has been having abd pain and vomiting all day. Hx of the same. Pt  Tearful. No other complaints. Pt vomiting in triage.

## 2016-12-26 NOTE — Discharge Instructions (Signed)
You were evaluated in the emergency department for nausea, vomiting and abdominal pain. Your blood work is reassuring. Your symptoms were treated in the emergency department. You were able to tolerate small sips of water without vomiting. I suspect your symptoms are from gastroparesis due to diabetes. Please take prescribed Reglan as instructed. Follow up with gastroenterologist as soon as possible for further discussion of nausea, vomiting and abdominal pain.

## 2016-12-26 NOTE — ED Provider Notes (Signed)
[facility] DEPT Provider Note   CSN: [id number] Arrival date & time: 12/26/16  1214     History   Chief Complaint Chief Complaint  Patient presents with  . Abdominal Pain  . Vomiting    HPI Shirley [name] is a 41 y.o. 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 Marland KitchenHas not been able to tolerate any fluids since onset of symptoms. States her diffuse lower abdominal pain has been ongoing for several months but has worsened in the last 2 weeks after a uterine fibroid embolization on 8/16. Denies fevers, chest pain, shortness of breath, dysuria, vaginal bleeding, diarrhea, constipation, melena, hematochezia. No known sick contacts with similar symptoms. Denies ETOH or marijuana use.   HPI  Past Medical History:  Diagnosis Date  . Abscess of tunica vaginalis    10/09- Abundant S. aureus- sensitive to all abx  . Anxiety   . Blood dyscrasia   . CAD (coronary artery disease) 06/15/2006   s/p Subendocardial MI with PDA angioplasty(no stent) on 06/15/06 and relook  cath 06/19/06 showed patency of site. Cath 12/10- no restenosis or significant CAD progression  . CVA (cerebral vascular accident) ([place]) ~ 02/2014   denies residual on 04/22/2014  . CVA (cerebral vascular accident) [facility])    history of remote right cerebellar infarct noted on head CT at least since 10/2011  . Depression   . Diabetes mellitus type 2, uncontrolled, with complications ([place])   . Fibromyalgia   . Gastritis   . Gastroparesis    secondary to poorly controlled DM, last emptying study performed 01/2010  was normal but may be falsely positive as pt was on reglan  . GERD (gastroesophageal reflux disease)   . Hepatitis B, chronic (HCC)    Hep BeAb+,Hep B cAb+ & Hep  BsAg+ (9/06)  . History of pyelonephritis    H/o GrpB Pyelonephritis (9/06) and UTI- 07/11- E.Coli, 12/10- GBS  . Hyperlipidemia   . Hypertension   . Iron deficiency anemia   . Irregular menses    Small ovarian follicles seen on LO(7/56)  . MI (myocardial infarction) ([place]) 05/2006   PDA percutaneous transluminal coronary angioplasty  . Migraine    "weekly" (04/22/2014)  . N&V (nausea and vomiting)    Chronic. Unclear etiology with multiple admission and ED visits. CT abdomen with and without contrast (02/2011)  showed no acute process. Gastic Emptying scan (01/2010) was normal. Ultrasound of the abdomen was within normal limits. Hepatitis B viral load was undectable. HIV NR. EGD - gastritis, Hpylori + s/p Rx  . Obesity   . OSA (obstructive sleep apnea)    "suppose to wear mask but I don't" (04/22/2014)  . Peripheral neuropathy   . Pneumonia    "this is probably the 2nd or 3rd time I've had pneumonia" (04/22/2014)  . Recurrent boils   . Seasonal asthma   . Substance abuse   . Thrombocytosis ([place])    Hem/Onc suggested 2/2 chronic hepatits and/or iron deficiency anemia    Patient Active Problem List   Diagnosis Date Noted  . Acute blood loss anemia 11/13/2016  . Menorrhagia 11/13/2016  . Bilateral pulmonary embolism ([place]) 10/30/2016  . Acute DVT (deep venous thrombosis) ([place]) 10/30/2016  . Type 2 diabetes mellitus with hyperglycemia ([place]) 04/07/2016  . Diabetic gastroparesis (  [place]) 04/06/2016  . Dehydration 03/27/2015  . Gastroparesis 11/11/2014  . Hematemesis 10/13/2014  . DKA (diabetic ketoacidoses) ([place]) 10/13/2014  . Tachycardia 07/21/2014  . Abdominal pain, chronic, left lower quadrant   . Diabetic gastroparesis associated with type 2 diabetes mellitus ([place]) 04/28/2014  . History of Helicobacter pylori infection 04/22/2014  . Vaginal discharge 02/18/2014  . Atypical chest pain 07/22/2013  . Unspecified constipation 07/21/2013  . Tinea corporis 07/21/2013  . Intractable  vomiting 04/29/2013  . Dysmenorrhea 04/22/2013  . UTI (urinary tract infection) 07/15/2012  . Headache(784.0) 02/08/2012  . Health care maintenance 01/22/2012  . Chronic hepatitis B ([place]) 03/07/2011  . History of leukocytosis 04/06/2010  . THROMBOCYTOSIS 04/06/2010  . Polysubstance abuse 02/23/2010  . Iron deficiency anemia 11/22/2009  . PERIPHERAL NEUROPATHY 10/01/2009  . Hyperlipidemia 08/30/2009  . Diabetic polyneuropathy ([place]) 08/30/2009  . Hidradenitis (recurrent boils) 07/07/2008  . Depression 12/27/2007  . Abdominal pain, left lower quadrant 11/21/2007  . FIBROMYALGIA 10/30/2007  . BACK PAIN 04/01/2007  . OBSTRUCTIVE SLEEP APNEA 01/17/2007  . ANXIETY DEPRESSION 06/27/2006  . Chronic ischemic heart disease 06/15/2006  . OBESITY, MORBID 05/15/2006  . MIGRAINE HEADACHE 05/15/2006  . Asthma 05/15/2006  . Essential hypertension 01/16/2006  . IRREGULAR MENSTRUATION 01/16/2006  . PEDAL EDEMA 01/16/2006  . Poorly controlled type 2 diabetes mellitus with peripheral neuropathy ([place]) 01/16/1989    Past Surgical History:  Procedure Laterality Date  . CESAREAN SECTION  1997  . CORONARY ANGIOPLASTY WITH STENT PLACEMENT  2008   "2 stents"  . ESOPHAGOGASTRODUODENOSCOPY N/A 04/23/2014   Procedure: ESOPHAGOGASTRODUODENOSCOPY (EGD);  Surgeon: Winfield Cunas., MD;  Location: [facility] ENDOSCOPY;  Service: Endoscopy;  Laterality: N/A;  . IR ANGIOGRAM PELVIS SELECTIVE OR SUPRASELECTIVE  12/07/2016  . IR ANGIOGRAM PELVIS SELECTIVE OR SUPRASELECTIVE  12/07/2016  . IR ANGIOGRAM SELECTIVE EACH ADDITIONAL VESSEL  12/07/2016  . IR ANGIOGRAM SELECTIVE EACH ADDITIONAL VESSEL  12/07/2016  . IR EMBO ARTERIAL NOT HEMORR HEMANG INC GUIDE ROADMAPPING  12/07/2016  . IR US GUIDE VASC ACCESS RIGHT  12/07/2016    OB History    Gravida Para Term Preterm AB Living   2 2 2          SAB TAB Ectopic Multiple Live Births           2       Home Medications    Prior to Admission medications   Medication Sig  Start Date End Date Taking? Authorizing Provider  albuterol (PROVENTIL HFA;VENTOLIN HFA) 108 (90 Base) MCG/ACT inhaler Inhale 2 puffs into the lungs every 4 (four) hours as needed for wheezing or shortness of breath. 09/27/16   Clent Demark, PA-C  apixaban (ELIQUIS) 5 MG TABS tablet Take 1 tablet (5 mg total) by mouth 2 (two) times daily. 11/18/16   Collier Salina, MD  ferrous sulfate 325 (65 FE) MG tablet Take 1 tablet (325 mg total) by mouth 2 (two) times daily with a meal. 09/05/16   Clent Demark, PA-C  glucose blood (FREESTYLE TEST STRIPS) test strip Use as instructed 09/05/16   Clent Demark, PA-C  glucose monitoring kit (FREESTYLE) monitoring kit 1 each by Does not apply route 4 (four) times daily - after meals and at bedtime. 1 month Diabetic Testing Supplies for QAC-QHS accuchecks. 09/05/16   Clent Demark, PA-C  hydrochlorothiazide (HYDRODIURIL) 25 MG tablet Take 1 tablet (25 mg total) by mouth daily. Take on tablet in the morning. Patient taking differently: Take 25 mg by mouth daily.  09/27/16   Clent Demark, PA-C  HYDROcodone-acetaminophen [facility]) 10-325 MG tablet Take 1 tablet by mouth every 6 (six) hours as needed for moderate pain or severe pain. 12/11/16   Zada Finders, MD  insulin NPH-regular Human (NOVOLIN 70/30) (70-30) 100 UNIT/ML injection 25 units in the morning and 25 units in the evening. Patient taking differently: Inject 25 Units into the skin 2 (two) times daily with a meal.  09/27/16   Clent Demark, PA-C  INSULIN SYRINGE .5CC/28G (INS SYRINGE/NEEDLE .5CC/28G) 28G X 1/2" 0.5 ML MISC Please provide 1 month supply 09/27/16   Clent Demark, PA-C  Lancets (FREESTYLE) lancets Use as instructed 09/27/16   Clent Demark, PA-C  losartan (COZAAR) 50 MG tablet Take 1 tablet (50 mg total) by mouth daily. 09/27/16   Clent Demark, PA-C  lovastatin (MEVACOR) 20 MG tablet Take 1 tablet (20 mg total) by mouth at bedtime. 09/27/16   Clent Demark,  PA-C  metoCLOPramide (REGLAN) 10 MG tablet Take 1 tablet (10 mg total) by mouth 4 (four) times daily -  before meals and at bedtime. 12/26/16   Kinnie Feil, PA-C  metoprolol tartrate (LOPRESSOR) 50 MG tablet Take 1 tablet (50 mg total) by mouth 2 (two) times daily. 09/27/16   Clent Demark, PA-C  omeprazole (PRILOSEC) 40 MG capsule Take 1 capsule (40 mg total) by mouth daily. 09/27/16   Clent Demark, PA-C  ondansetron (ZOFRAN ODT) 4 MG disintegrating tablet Take 1 tablet (4 mg total) by mouth every 8 (eight) hours as needed for nausea or vomiting. 12/24/16 12/27/16  Fatima Blank, MD  ondansetron (ZOFRAN ODT) 8 MG disintegrating tablet Take 1 tablet (8 mg total) by mouth every 8 (eight) hours as needed for nausea or vomiting. 12/24/16   Lacretia Leigh, MD  oxyCODONE-acetaminophen (PERCOCET/ROXICET) 5-325 MG tablet Take 1 tablet by mouth every 6 (six) hours as needed for severe pain. 12/21/16   Mackuen, Courteney Lyn, MD  promethazine (PHENERGAN) 25 MG tablet Take 1 tablet (25 mg total) by mouth every 6 (six) hours as needed for nausea or vomiting. 12/16/16   Mackuen, Fredia Sorrow, MD    Family History Family History  Problem Relation Age of Onset  . Diabetes Father     Social History Social History  Substance Use Topics  . Smoking status: Former Smoker    Types: Cigarettes    Quit date: 04/24/1996  . Smokeless tobacco: Never Used     Comment: quit smoking cigarettes age 50  . Alcohol use No     Comment: 04/22/2014 "might have a few drinks a month"     Allergies   Lisinopril and Morphine and related   Review of Systems Review of Systems  Constitutional: Negative for chills, diaphoresis and fever.  Respiratory: Negative for cough and shortness of breath.   Cardiovascular: Negative for chest pain.  Gastrointestinal: Positive for abdominal pain, nausea and vomiting. Negative for anal bleeding, blood in stool, constipation and diarrhea.  Endocrine: Negative for  polydipsia and polyuria.  Genitourinary: Negative for difficulty urinating, dysuria, hematuria, vaginal bleeding and vaginal discharge.  Musculoskeletal: Negative for back pain.  Allergic/Immunologic: Positive for immunocompromised state.     Physical Exam Updated Vital Signs BP (!) 162/93 (BP Location: Right Arm)   Pulse 90   Temp 98.3 F (36.8 C) (Oral)   Resp 16   Ht 5' 7"  (1.702 m)   Wt 95.3 kg (210 lb)   LMP 12/26/2016   SpO2 100%  BMI 32.89 kg/m   Physical Exam  Constitutional: She is oriented to person, place, and time. She appears well-developed and well-nourished. No distress.  Pt is rocking back and forth in bed.   HENT:  Head: Normocephalic and atraumatic.  Nose: Nose normal.  Mouth/Throat: No oropharyngeal exudate.  Moist mucous membranes  Eyes: Pupils are equal, round, and reactive to light. Conjunctivae and EOM are normal.  Neck: Normal range of motion. Neck supple.  Cardiovascular: Normal rate, regular rhythm, normal heart sounds and intact distal pulses.   No murmur heard. Pulses:      Radial pulses are 2+ on the right side, and 2+ on the left side.       Dorsalis pedis pulses are 2+ on the right side, and 2+ on the left side.  Pulmonary/Chest: Effort normal and breath sounds normal. She exhibits no tenderness.  Abdominal: Soft. Bowel sounds are normal. She exhibits no distension. There is no tenderness.  Abdomen is soft non distended Diffuse periumbilical and right middle and lower abdominal tenderness  No suprapubic or CVA tenderness  Musculoskeletal: Normal range of motion. She exhibits no deformity.  Neurological: She is alert and oriented to person, place, and time. No sensory deficit.  Skin: Skin is warm and dry. Capillary refill takes less than 2 seconds.  Psychiatric: She has a normal mood and affect. Her behavior is normal. Judgment and thought content normal.  Nursing note and vitals reviewed.    ED Treatments / Results  Labs (all labs  ordered are listed, but only abnormal results are displayed) Labs Reviewed  COMPREHENSIVE METABOLIC PANEL - Abnormal; Notable for the following:       Result Value   CO2 20 (*)    Glucose, Bld 290 (*)    Creatinine, Ser 1.03 (*)    Total Protein 8.3 (*)    ALT 9 (*)    All other components within normal limits  CBC - Abnormal; Notable for the following:    Hemoglobin 11.1 (*)    HCT 32.5 (*)    MCV 75.8 (*)    MCH 25.9 (*)    RDW 17.4 (*)    Platelets 531 (*)    All other components within normal limits  URINALYSIS, ROUTINE W REFLEX MICROSCOPIC - Abnormal; Notable for the following:    Color, Urine STRAW (*)    Glucose, UA >=500 (*)    Hgb urine dipstick MODERATE (*)    Protein, ur 30 (*)    Squamous Epithelial / LPF 0-5 (*)    All other components within normal limits  LIPASE, BLOOD  POC URINE PREG, ED    EKG  EKG Interpretation None       Radiology No results found.  Procedures Procedures (including critical care time)  Medications Ordered in ED Medications  promethazine (PHENERGAN) suppository 50 mg (not administered)  ondansetron (ZOFRAN-ODT) disintegrating tablet 4 mg (4 mg Oral Given 12/26/16 1250)  sodium chloride 0.9 % bolus 500 mL (0 mLs Intravenous Stopped 12/26/16 1919)  haloperidol lactate (HALDOL) injection 1 mg (1 mg Intravenous Given 12/26/16 1732)  promethazine (PHENERGAN) tablet 12.5 mg (12.5 mg Oral Given 12/26/16 1732)  fentaNYL (SUBLIMAZE) injection 50 mcg (50 mcg Intravenous Given 12/26/16 1732)     Initial Impression / Assessment and Plan / ED Course  I have reviewed the triage vital signs and the nursing notes.  Pertinent labs & imaging results that were available during my care of the patient were reviewed by me and considered in my  medical decision making (see chart for details).  Clinical Course as of Dec 26 1957  Tue Dec 26, 2016  1931 Re-evaluated pt. She was found asleep. Abdomen remains benign. VS wnl. She has drank water without emesis  but states "it made me sick". Spoke to RN and tech who both watched patient drink cup of water and fall asleep without emesis. Emesis bag is empty.   [CG]    Clinical Course User Index [CG] Kinnie Feil, PA-C    41 yo female with pmh/o nsulin dependent T2DM, gastroparesis, HTN, fibromyalgia presents to ED for evaluation of n/v and diffuse abdominal pain that woke her up this morning. Symptoms appear to be chronic. Chart review reveals patient was seen in the ED for nausea, vomiting and abdominal pain 2 days ago. She had a normal CT abdomen/pelvis with contrast at that time. Her symptoms were treated and she was discharged home with antiemetics. She has not refilled her prescriptions for nausea medications. Last EGD 2015 showed gastroparesis but normal esophageal and gastric mucosa. This year pt has had multiple plain films, CT scans, CTA and MRI of pelvis, abdomen, lumbar spine. Most recent CT a/p was two days ago, normal. Other than uterine fibroid embolization, no other abdominal surgeries. Denies marijuana use.   Lab work today reassuring. No evidence of pancreatitis. No leukocytosis. No evidence of UTI. LFTs normal. Negative pregnancy. Labs appear to be at baseline. Pt was given zofran, phenergan, IVF bolus, haldol and fentanyl. Repeat abdominal exam unchanged from initial. Pt was asleep when I walked in the room but when she woke up she started shaking her legs and rocking back and forth in bed from pain. States drinking water "made her sick". However RN and tech observed pt drink water and fall asleep. Emesis bag is empty.   Given reassuring work up and repeat exam. Will d/c patient with anti-emetics and GI f/u. Discussed work up with pt and discharge plan. She was agreeable.   Final Clinical Impressions(s) / ED Diagnoses   Final diagnoses:  Non-intractable cyclical vomiting with nausea  Abdominal pain in female    New Prescriptions Current Discharge Medication List       Arlean Hopping 12/26/16 Tarri Fuller, MD 12/27/16 [phone]

## 2016-12-26 NOTE — ED Notes (Signed)
Pt sitting up, laughing and chatting with people in the waiting room.

## 2016-12-26 NOTE — ED Notes (Signed)
Pt continues to wretch every time RN walks by.

## 2016-12-26 NOTE — ED Notes (Signed)
Patient unable to urinate at this time.  Will call nurse when she is ready.

## 2016-12-26 NOTE — ED Notes (Signed)
Writer requested patient to get urine sample, pt states to give her a second because she had just went before coming to the room

## 2016-12-31 ENCOUNTER — Encounter (HOSPITAL_COMMUNITY): Payer: Self-pay

## 2016-12-31 ENCOUNTER — Emergency Department (HOSPITAL_COMMUNITY)
Admission: EM | Admit: 2016-12-31 | Discharge: 2017-01-01 | Disposition: A | Discharge status: Home / Self Care | Payer: Medicaid Other | Attending: Emergency Medicine | Admitting: Emergency Medicine

## 2016-12-31 DIAGNOSIS — Z79899 Other long term (current) drug therapy: Secondary | ICD-10-CM | POA: Insufficient documentation

## 2016-12-31 DIAGNOSIS — R1030 Lower abdominal pain, unspecified: Secondary | ICD-10-CM | POA: Insufficient documentation

## 2016-12-31 DIAGNOSIS — Z7901 Long term (current) use of anticoagulants: Secondary | ICD-10-CM | POA: Insufficient documentation

## 2016-12-31 DIAGNOSIS — Z8673 Personal history of transient ischemic attack (TIA), and cerebral infarction without residual deficits: Secondary | ICD-10-CM | POA: Insufficient documentation

## 2016-12-31 DIAGNOSIS — Z87891 Personal history of nicotine dependence: Secondary | ICD-10-CM | POA: Insufficient documentation

## 2016-12-31 DIAGNOSIS — Z955 Presence of coronary angioplasty implant and graft: Secondary | ICD-10-CM | POA: Insufficient documentation

## 2016-12-31 DIAGNOSIS — R1084 Generalized abdominal pain: Secondary | ICD-10-CM

## 2016-12-31 DIAGNOSIS — I10 Essential (primary) hypertension: Secondary | ICD-10-CM | POA: Insufficient documentation

## 2016-12-31 DIAGNOSIS — I252 Old myocardial infarction: Secondary | ICD-10-CM | POA: Insufficient documentation

## 2016-12-31 DIAGNOSIS — E119 Type 2 diabetes mellitus without complications: Secondary | ICD-10-CM | POA: Insufficient documentation

## 2016-12-31 DIAGNOSIS — J45909 Unspecified asthma, uncomplicated: Secondary | ICD-10-CM | POA: Insufficient documentation

## 2016-12-31 DIAGNOSIS — R112 Nausea with vomiting, unspecified: Secondary | ICD-10-CM | POA: Insufficient documentation

## 2016-12-31 DIAGNOSIS — I2510 Atherosclerotic heart disease of native coronary artery without angina pectoris: Secondary | ICD-10-CM | POA: Insufficient documentation

## 2016-12-31 DIAGNOSIS — Z794 Long term (current) use of insulin: Secondary | ICD-10-CM | POA: Insufficient documentation

## 2016-12-31 LAB — CBC WITH DIFFERENTIAL/PLATELET
BASOS ABS: 0 10*3/uL (ref 0.0–0.1)
BASOS PCT: 0 %
EOS ABS: 0.7 10*3/uL (ref 0.0–0.7)
Eosinophils Relative: 5 %
HCT: 32.5 % — ABNORMAL LOW (ref 36.0–46.0)
Hemoglobin: 11.1 g/dL — ABNORMAL LOW (ref 12.0–15.0)
Lymphocytes Relative: 24 %
Lymphs Abs: 3 10*3/uL (ref 0.7–4.0)
MCH: 25.9 pg — ABNORMAL LOW (ref 26.0–34.0)
MCHC: 34.2 g/dL (ref 30.0–36.0)
MCV: 75.9 fL — ABNORMAL LOW (ref 78.0–100.0)
MONO ABS: 0.9 10*3/uL (ref 0.1–1.0)
MONOS PCT: 7 %
NEUTROS ABS: 8 10*3/uL — AB (ref 1.7–7.7)
Neutrophils Relative %: 64 %
PLATELETS: 413 10*3/uL — AB (ref 150–400)
RBC: 4.28 MIL/uL (ref 3.87–5.11)
RDW: 16.6 % — AB (ref 11.5–15.5)
WBC: 12.5 10*3/uL — ABNORMAL HIGH (ref 4.0–10.5)

## 2016-12-31 MED ORDER — PROMETHAZINE HCL 25 MG/ML IJ SOLN
12.5000 mg | Freq: Once | INTRAMUSCULAR | Status: AC
  Administered 2016-12-31: 12.5 mg via INTRAVENOUS
  Filled 2016-12-31: qty 1

## 2016-12-31 MED ORDER — FENTANYL CITRATE (PF) 100 MCG/2ML IJ SOLN
75.0000 ug | Freq: Once | INTRAMUSCULAR | Status: AC
  Administered 2016-12-31: 75 ug via INTRAVENOUS
  Filled 2016-12-31: qty 2

## 2016-12-31 MED ORDER — LORAZEPAM 2 MG/ML IJ SOLN
1.0000 mg | Freq: Once | INTRAMUSCULAR | Status: AC
  Administered 2016-12-31: 1 mg via INTRAVENOUS
  Filled 2016-12-31: qty 1

## 2016-12-31 MED ORDER — SODIUM CHLORIDE 0.9 % IV BOLUS (SEPSIS)
1000.0000 mL | Freq: Once | INTRAVENOUS | Status: AC
  Administered 2016-12-31: 1000 mL via INTRAVENOUS

## 2016-12-31 NOTE — ED Provider Notes (Signed)
[facility] DEPT Provider Note   CSN: [id number] Arrival date & time: 12/31/16  2155     History   Chief Complaint Chief Complaint  Patient presents with  . Abdominal Pain  . Nausea  . Emesis    HPI Shirley [name] is a 41 y.o. female.  Patient presents to the ED with a chief complaint of abdominal pain.  She reports associated nausea and vomiting.  She states that she has a history of gastroparesis and states that this seems similar.  She reports that she has been having constant lower abdominal pain since having a uterine fibroid embolization about 3 weeks ago.  She states that she is scheduled for a f/u visit with her OBGYN next week.  She states that the pain is mostly located in her right lower side.  She denies any fevers or chills.  She denies any diarrhea, constipation, dysuria, hematuria, vaginal discharge or bleeding.  She has not taken anything for her symptoms.  There are no other associated symptoms.   The history is provided by the patient. No language interpreter was used.    Past Medical History:  Diagnosis Date  . Abscess of tunica vaginalis    10/09- Abundant S. aureus- sensitive to all abx  . Anxiety   . Blood dyscrasia   . CAD (coronary artery disease) 06/15/2006   s/p Subendocardial MI with PDA angioplasty(no stent) on 06/15/06 and relook  cath 06/19/06 showed patency of site. Cath 12/10- no restenosis or significant CAD progression  . CVA (cerebral vascular accident) ([place]) ~ 02/2014   denies residual on 04/22/2014  . CVA (cerebral vascular accident) [facility])    history of remote right cerebellar infarct noted on head CT at least since 10/2011  . Depression   . Diabetes mellitus type 2, uncontrolled, with complications ([place])   . Fibromyalgia   . Gastritis   . Gastroparesis    secondary to poorly controlled DM, last emptying study performed 01/2010  was normal but may be falsely positive as pt was on reglan  . GERD (gastroesophageal reflux disease)   .  Hepatitis B, chronic (HCC)    Hep BeAb+,Hep B cAb+ & Hep BsAg+ (9/06)  . History of pyelonephritis    H/o GrpB Pyelonephritis (9/06) and UTI- 07/11- E.Coli, 12/10- GBS  . Hyperlipidemia   . Hypertension   . Iron deficiency anemia   . Irregular menses    Small ovarian follicles seen on NL(9/76)  . MI (myocardial infarction) ([place]) 05/2006   PDA percutaneous transluminal coronary angioplasty  . Migraine    "weekly" (04/22/2014)  . N&V (nausea and vomiting)    Chronic. Unclear etiology with multiple admission and ED visits. CT abdomen with and without contrast (02/2011)  showed no acute process. Gastic Emptying scan (01/2010) was normal. Ultrasound of the abdomen was within normal limits. Hepatitis B viral load was undectable. HIV NR. EGD - gastritis, Hpylori + s/p Rx  . Obesity   . OSA (obstructive sleep apnea)    "suppose to wear mask but I don't" (04/22/2014)  . Peripheral neuropathy   . Pneumonia    "this is probably the 2nd or 3rd time I've had pneumonia" (04/22/2014)  . Recurrent boils   . Seasonal asthma   . Substance abuse   . Thrombocytosis ([place])    Hem/Onc suggested 2/2 chronic hepatits and/or iron deficiency anemia    Patient Active Problem List   Diagnosis Date Noted  . Acute blood loss anemia 11/13/2016  . Menorrhagia 11/13/2016  .  Bilateral pulmonary embolism ([place]) 10/30/2016  . Acute DVT (deep venous thrombosis) ([place]) 10/30/2016  . Type 2 diabetes mellitus with hyperglycemia ([place]) 04/07/2016  . Diabetic gastroparesis ([place]) 04/06/2016  . Dehydration 03/27/2015  . Gastroparesis 11/11/2014  . Hematemesis 10/13/2014  . DKA (diabetic ketoacidoses) ([place]) 10/13/2014  . Tachycardia 07/21/2014  . Abdominal pain, chronic, left lower quadrant   . Diabetic gastroparesis associated with type 2 diabetes mellitus ([place]) 04/28/2014  . History of Helicobacter pylori infection 04/22/2014  . Vaginal discharge 02/18/2014  . Atypical chest pain 07/22/2013  . Unspecified  constipation 07/21/2013  . Tinea corporis 07/21/2013  . Intractable vomiting 04/29/2013  . Dysmenorrhea 04/22/2013  . UTI (urinary tract infection) 07/15/2012  . Headache(784.0) 02/08/2012  . Health care maintenance 01/22/2012  . Chronic hepatitis B ([place]) 03/07/2011  . History of leukocytosis 04/06/2010  . THROMBOCYTOSIS 04/06/2010  . Polysubstance abuse 02/23/2010  . Iron deficiency anemia 11/22/2009  . PERIPHERAL NEUROPATHY 10/01/2009  . Hyperlipidemia 08/30/2009  . Diabetic polyneuropathy ([place]) 08/30/2009  . Hidradenitis (recurrent boils) 07/07/2008  . Depression 12/27/2007  . Abdominal pain, left lower quadrant 11/21/2007  . FIBROMYALGIA 10/30/2007  . BACK PAIN 04/01/2007  . OBSTRUCTIVE SLEEP APNEA 01/17/2007  . ANXIETY DEPRESSION 06/27/2006  . Chronic ischemic heart disease 06/15/2006  . OBESITY, MORBID 05/15/2006  . MIGRAINE HEADACHE 05/15/2006  . Asthma 05/15/2006  . Essential hypertension 01/16/2006  . IRREGULAR MENSTRUATION 01/16/2006  . PEDAL EDEMA 01/16/2006  . Poorly controlled type 2 diabetes mellitus with peripheral neuropathy ([place]) 01/16/1989    Past Surgical History:  Procedure Laterality Date  . CESAREAN SECTION  1997  . CORONARY ANGIOPLASTY WITH STENT PLACEMENT  2008   "2 stents"  . ESOPHAGOGASTRODUODENOSCOPY N/A 04/23/2014   Procedure: ESOPHAGOGASTRODUODENOSCOPY (EGD);  Surgeon: Winfield Cunas., MD;  Location: [facility] ENDOSCOPY;  Service: Endoscopy;  Laterality: N/A;  . IR ANGIOGRAM PELVIS SELECTIVE OR SUPRASELECTIVE  12/07/2016  . IR ANGIOGRAM PELVIS SELECTIVE OR SUPRASELECTIVE  12/07/2016  . IR ANGIOGRAM SELECTIVE EACH ADDITIONAL VESSEL  12/07/2016  . IR ANGIOGRAM SELECTIVE EACH ADDITIONAL VESSEL  12/07/2016  . IR EMBO ARTERIAL NOT HEMORR HEMANG INC GUIDE ROADMAPPING  12/07/2016  . IR US GUIDE VASC ACCESS RIGHT  12/07/2016    OB History    Gravida Para Term Preterm AB Living   _0 SAB TAB Ectopic Multiple Live Births           2        Home Medications    Prior to Admission medications   Medication Sig Start Date End Date Taking? Authorizing Provider  albuterol (PROVENTIL HFA;VENTOLIN HFA) 108 (90 Base) MCG/ACT inhaler Inhale 2 puffs into the lungs every 4 (four) hours as needed for wheezing or shortness of breath. 09/27/16   Clent Demark, PA-C  apixaban (ELIQUIS) 5 MG TABS tablet Take 1 tablet (5 mg total) by mouth 2 (two) times daily. 11/18/16   Collier Salina, MD  ferrous sulfate 325 (65 FE) MG tablet Take 1 tablet (325 mg total) by mouth 2 (two) times daily with a meal. 09/05/16   Clent Demark, PA-C  glucose blood (FREESTYLE TEST STRIPS) test strip Use as instructed 09/05/16   Clent Demark, PA-C  glucose monitoring kit (FREESTYLE) monitoring kit 1 each by Does not apply route 4 (four) times daily - after meals and at bedtime. 1 month Diabetic Testing Supplies for QAC-QHS accuchecks. 09/05/16   Clent Demark, PA-C  hydrochlorothiazide (  HYDRODIURIL) 25 MG tablet Take 1 tablet (25 mg total) by mouth daily. Take on tablet in the morning. Patient taking differently: Take 25 mg by mouth daily.  09/27/16   Clent Demark, PA-C  HYDROcodone-acetaminophen [facility]) 10-325 MG tablet Take 1 tablet by mouth every 6 (six) hours as needed for moderate pain or severe pain. 12/11/16   Zada Finders, MD  insulin NPH-regular Human (NOVOLIN 70/30) (70-30) 100 UNIT/ML injection 25 units in the morning and 25 units in the evening. Patient taking differently: Inject 25 Units into the skin 2 (two) times daily with a meal.  09/27/16   Clent Demark, PA-C  INSULIN SYRINGE .5CC/28G (INS SYRINGE/NEEDLE .5CC/28G) 28G X 1/2" 0.5 ML MISC Please provide 1 month supply 09/27/16   Clent Demark, PA-C  Lancets (FREESTYLE) lancets Use as instructed 09/27/16   Clent Demark, PA-C  losartan (COZAAR) 50 MG tablet Take 1 tablet (50 mg total) by mouth daily. 09/27/16   Clent Demark, PA-C  lovastatin (MEVACOR) 20 MG tablet  Take 1 tablet (20 mg total) by mouth at bedtime. 09/27/16   Clent Demark, PA-C  metoCLOPramide (REGLAN) 10 MG tablet Take 1 tablet (10 mg total) by mouth 4 (four) times daily -  before meals and at bedtime. 12/26/16   Kinnie Feil, PA-C  metoprolol tartrate (LOPRESSOR) 50 MG tablet Take 1 tablet (50 mg total) by mouth 2 (two) times daily. 09/27/16   Clent Demark, PA-C  omeprazole (PRILOSEC) 40 MG capsule Take 1 capsule (40 mg total) by mouth daily. 09/27/16   Clent Demark, PA-C  ondansetron (ZOFRAN ODT) 8 MG disintegrating tablet Take 1 tablet (8 mg total) by mouth every 8 (eight) hours as needed for nausea or vomiting. 12/24/16   Lacretia Leigh, MD  oxyCODONE-acetaminophen (PERCOCET/ROXICET) 5-325 MG tablet Take 1 tablet by mouth every 6 (six) hours as needed for severe pain. 12/21/16   Mackuen, Courteney Lyn, MD  promethazine (PHENERGAN) 25 MG tablet Take 1 tablet (25 mg total) by mouth every 6 (six) hours as needed for nausea or vomiting. 12/16/16   Mackuen, Fredia Sorrow, MD    Family History Family History  Problem Relation Age of Onset  . Diabetes Father     Social History Social History  Substance Use Topics  . Smoking status: Former Smoker    Types: Cigarettes    Quit date: 04/24/1996  . Smokeless tobacco: Never Used     Comment: quit smoking cigarettes age 39  . Alcohol use No     Comment: 04/22/2014 "might have a few drinks a month"     Allergies   Lisinopril and Morphine and related   Review of Systems Review of Systems  All other systems reviewed and are negative.    Physical Exam Updated Vital Signs BP (!) 166/120 (BP Location: Left Arm)   Pulse (!) 102   Temp 99.1 F (37.3 C) (Oral)   Resp 18   LMP 12/26/2016   SpO2 99%   Physical Exam  Constitutional: She is oriented to person, place, and time. She appears well-developed and well-nourished.  HENT:  Head: Normocephalic and atraumatic.  Eyes: Pupils are equal, round, and reactive to light.  Conjunctivae and EOM are normal.  Neck: Normal range of motion. Neck supple.  Cardiovascular: Regular rhythm.  Exam reveals no gallop and no friction rub.   No murmur heard. Mildly tachycardic  Pulmonary/Chest: Effort normal and breath sounds normal. No respiratory distress. She has no wheezes. She has  no rales. She exhibits no tenderness.  Abdominal: Soft. Bowel sounds are normal. She exhibits no distension and no mass. There is no tenderness. There is no rebound and no guarding.  No focal abdominal tenderness, no RLQ tenderness or pain at McBurney's point, no RUQ tenderness or Murphy's sign, no left-sided abdominal tenderness, no fluid wave, or signs of peritonitis   Musculoskeletal: Normal range of motion. She exhibits no edema or tenderness.  Neurological: She is alert and oriented to person, place, and time.  Skin: Skin is warm and dry.  Psychiatric: She has a normal mood and affect. Her behavior is normal. Judgment and thought content normal.  Nursing note and vitals reviewed.    ED Treatments / Results  Labs (all labs ordered are listed, but only abnormal results are displayed) Labs Reviewed  CBC WITH DIFFERENTIAL/PLATELET  COMPREHENSIVE METABOLIC PANEL  LIPASE, BLOOD  URINALYSIS, ROUTINE W REFLEX MICROSCOPIC  I-STAT BETA HCG BLOOD, ED ([facility], [facility], [facility] ONLY)    EKG  EKG Interpretation None       Radiology No results found.  Procedures Procedures (including critical care time)  Medications Ordered in ED Medications  fentaNYL (SUBLIMAZE) injection 75 mcg (not administered)  promethazine (PHENERGAN) injection 12.5 mg (not administered)  LORazepam (ATIVAN) injection 1 mg (not administered)  sodium chloride 0.9 % bolus 1,000 mL (not administered)     Initial Impression / Assessment and Plan / ED Course  I have reviewed the triage vital signs and the nursing notes.  Pertinent labs & imaging results that were available during my care of the patient were reviewed by me  and considered in my medical decision making (see chart for details).   Patient with lower abdominal pain, nausea, and vomiting.  She is well known to this ED and has been seen 20 times in the past 6 months, many visits of which were for similar complaints.  She has had multiple advanced imaging studies and a CT as recently as 1 week ago that showed interval central hypoattenuation of large exophytic uterine fibroid compatible with necrosis and post embolization changes.  Will check labs.  Will reassess.  Patient has been reassessed several times by me, every time I walk into the room the patient is asleep or resting comfortably, and then as soon as I begin to say something, and she becomes aware of my presence and her pain returns. I'm somewhat suspicious of drug-seeking behavior. Patient's labs are unrevealing for any acute emergent process. I do not feel that any further emergent workup is indicated. Will discharge with close return precautions.  Final Clinical Impressions(s) / ED Diagnoses   Final diagnoses:  Nausea and vomiting, intractability of vomiting not specified, unspecified vomiting type  Generalized abdominal pain    New Prescriptions New Prescriptions   No medications on file     Montine Circle, Hershal Coria 01/01/17 1308    Orpah Greek, MD 01/01/17 [phone]

## 2016-12-31 NOTE — ED Triage Notes (Signed)
To room via EMS.  Onset 3 days RLQ abd pain, N/V.  Today worsened.

## 2016-12-31 NOTE — ED Notes (Signed)
Two unsuccessful attempts at getting IV access.  

## 2017-01-01 LAB — COMPREHENSIVE METABOLIC PANEL
ALBUMIN: 3.3 g/dL — AB (ref 3.5–5.0)
ALT: 8 U/L — ABNORMAL LOW (ref 14–54)
ANION GAP: 9 (ref 5–15)
AST: 15 U/L (ref 15–41)
Alkaline Phosphatase: 46 U/L (ref 38–126)
BUN: 10 mg/dL (ref 6–20)
CO2: 23 mmol/L (ref 22–32)
Calcium: 9.5 mg/dL (ref 8.9–10.3)
Chloride: 102 mmol/L (ref 101–111)
Creatinine, Ser: 1.01 mg/dL — ABNORMAL HIGH (ref 0.44–1.00)
GFR calc Af Amer: >60 mL/min (ref >60)
GFR calc non Af Amer: >60 mL/min (ref >60)
GLUCOSE: 243 mg/dL — AB (ref 65–99)
POTASSIUM: 3.6 mmol/L (ref 3.5–5.1)
SODIUM: 134 mmol/L — AB (ref 135–145)
TOTAL PROTEIN: 8 g/dL (ref 6.5–8.1)
Total Bilirubin: 0.4 mg/dL (ref 0.3–1.2)

## 2017-01-01 LAB — URINALYSIS, ROUTINE W REFLEX MICROSCOPIC
BILIRUBIN URINE: NEGATIVE
Glucose, UA: >=500 mg/dL — AB
KETONES UR: NEGATIVE mg/dL
NITRITE: NEGATIVE
PROTEIN: 30 mg/dL — AB
Specific Gravity, Urine: 1.008 (ref 1.005–1.030)
pH: 7 (ref 5.0–8.0)

## 2017-01-01 LAB — PREGNANCY, URINE: PREG TEST UR: NEGATIVE

## 2017-01-01 LAB — LIPASE, BLOOD: Lipase: 32 U/L (ref 11–51)

## 2017-01-01 MED ORDER — FENTANYL CITRATE (PF) 100 MCG/2ML IJ SOLN
75.0000 ug | Freq: Once | INTRAMUSCULAR | Status: AC
  Administered 2017-01-01: 75 ug via INTRAVENOUS
  Filled 2017-01-01: qty 2

## 2017-01-01 MED ORDER — PROMETHAZINE HCL 25 MG/ML IJ SOLN
12.5000 mg | Freq: Once | INTRAMUSCULAR | Status: AC
  Administered 2017-01-01: 12.5 mg via INTRAVENOUS
  Filled 2017-01-01: qty 1

## 2017-01-01 NOTE — ED Notes (Signed)
Pa Browning to have I Stat beta and I Stat lactic acid changed to regular labs due to pt a hard stick per PA Browning. All tubes gone through in [facility] and no I Stat tube.

## 2017-01-01 NOTE — ED Notes (Signed)
Ambulated to bathroom.  Fell asleep on the toilet.  Assisted back to bed.  While going over discharge instructions fell asleep again.  Upon waking patient up states "did I get my pain medicine."  Reminded that she received two doses for pain.  Patient calling for ride at this time.

## 2017-01-02 ENCOUNTER — Ambulatory Visit (INDEPENDENT_AMBULATORY_CARE_PROVIDER_SITE_OTHER): Payer: Self-pay | Admitting: Physician Assistant

## 2017-01-02 ENCOUNTER — Encounter (INDEPENDENT_AMBULATORY_CARE_PROVIDER_SITE_OTHER): Payer: Self-pay | Admitting: Physician Assistant

## 2017-01-02 VITALS — BP 120/79 | HR 100 | Temp 98.7°F | Resp 18 | Ht 67.0 in | Wt 213.0 lb

## 2017-01-02 DIAGNOSIS — R1031 Right lower quadrant pain: Secondary | ICD-10-CM

## 2017-01-02 DIAGNOSIS — E1169 Type 2 diabetes mellitus with other specified complication: Secondary | ICD-10-CM

## 2017-01-02 DIAGNOSIS — J320 Chronic maxillary sinusitis: Secondary | ICD-10-CM | POA: Insufficient documentation

## 2017-01-02 DIAGNOSIS — J0100 Acute maxillary sinusitis, unspecified: Secondary | ICD-10-CM

## 2017-01-02 DIAGNOSIS — Z794 Long term (current) use of insulin: Secondary | ICD-10-CM

## 2017-01-02 DIAGNOSIS — K3184 Gastroparesis: Secondary | ICD-10-CM

## 2017-01-02 LAB — POCT GLYCOSYLATED HEMOGLOBIN (HGB A1C): Hemoglobin A1C: 9.6

## 2017-01-02 MED ORDER — ERYTHROMYCIN ETHYLSUCCINATE 200 MG/5ML PO SUSR: 200.0000 mg | Freq: Three times a day (TID) | ORAL | 0 refills | Status: DC

## 2017-01-02 MED ORDER — HYDROCODONE-ACETAMINOPHEN 10-325 MG PO TABS: 1.0000 | ORAL_TABLET | Freq: Three times a day (TID) | ORAL | 0 refills | Status: DC | PRN

## 2017-01-02 MED ORDER — INSULIN NPH ISOPHANE & REGULAR (70-30) 100 UNIT/ML ~~LOC~~ SUSP: SUBCUTANEOUS | 11 refills | Status: DC

## 2017-01-02 MED ORDER — "INSULIN SYRINGE 28G X 1/2"" 0.5 ML MISC": 11 refills | Status: DC

## 2017-01-02 MED ORDER — DM-APAP-CPM 15-500-2 MG PO TABS: 2.0000 | ORAL_TABLET | Freq: Four times a day (QID) | ORAL | 0 refills | Status: AC

## 2017-01-02 MED ORDER — FREESTYLE LANCETS MISC: 11 refills | Status: DC

## 2017-01-02 MED FILL — TRUEPLUS SYR 0.5ML 30GX5/16: 30G X 5/16" | 30 days supply | Qty: 100 | Fill #0

## 2017-01-02 NOTE — Patient Instructions (Signed)
Diabetes Mellitus and Food It is important for you to manage your blood sugar (glucose) level. Your blood glucose level can be greatly affected by what you eat. Eating healthier foods in the appropriate amounts throughout the day at about the same time each day will help you control your blood glucose level. It can also help slow or prevent worsening of your diabetes mellitus. Healthy eating may even help you improve the level of your blood pressure and reach or maintain a healthy weight. General recommendations for healthful eating and cooking habits include:  Eating meals and snacks regularly. Avoid going long periods of time without eating to lose weight.  Eating a diet that consists mainly of plant-based foods, such as fruits, vegetables, nuts, legumes, and whole grains.  Using low-heat cooking methods, such as baking, instead of high-heat cooking methods, such as deep frying.  Work with your dietitian to make sure you understand how to use the Nutrition Facts information on food labels. How can food affect me? Carbohydrates Carbohydrates affect your blood glucose level more than any other type of food. Your dietitian will help you determine how many carbohydrates to eat at each meal and teach you how to count carbohydrates. Counting carbohydrates is important to keep your blood glucose at a healthy level, especially if you are using insulin or taking certain medicines for diabetes mellitus. Alcohol Alcohol can cause sudden decreases in blood glucose (hypoglycemia), especially if you use insulin or take certain medicines for diabetes mellitus. Hypoglycemia can be a life-threatening condition. Symptoms of hypoglycemia (sleepiness, dizziness, and disorientation) are similar to symptoms of having too much alcohol. If your health care provider has given you approval to drink alcohol, do so in moderation and use the following guidelines:  Women should not have more than one drink per day, and men  should not have more than two drinks per day. One drink is equal to: ? 12 oz of beer. ? 5 oz of wine. ? 1 oz of hard liquor.  Do not drink on an empty stomach.  Keep yourself hydrated. Have water, diet soda, or unsweetened iced tea.  Regular soda, juice, and other mixers might contain a lot of carbohydrates and should be counted.  What foods are not recommended? As you make food choices, it is important to remember that all foods are not the same. Some foods have fewer nutrients per serving than other foods, even though they might have the same number of calories or carbohydrates. It is difficult to get your body what it needs when you eat foods with fewer nutrients. Examples of foods that you should avoid that are high in calories and carbohydrates but low in nutrients include:  Trans fats (most processed foods list trans fats on the Nutrition Facts label).  Regular soda.  Juice.  Candy.  Sweets, such as cake, pie, doughnuts, and cookies.  Fried foods.  What foods can I eat? Eat nutrient-rich foods, which will nourish your body and keep you healthy. The food you should eat also will depend on several factors, including:  The calories you need.  The medicines you take.  Your weight.  Your blood glucose level.  Your blood pressure level.  Your cholesterol level.  You should eat a variety of foods, including:  Protein. ? Lean cuts of meat. ? Proteins low in saturated fats, such as fish, egg whites, and beans. Avoid processed meats.  Fruits and vegetables. ? Fruits and vegetables that may help control blood glucose levels, such as apples,   mangoes, and yams.  Dairy products. ? Choose fat-free or low-fat dairy products, such as milk, yogurt, and cheese.  Grains, bread, pasta, and rice. ? Choose whole grain products, such as multigrain bread, whole oats, and brown rice. These foods may help control blood pressure.  Fats. ? Foods containing healthful fats, such as  nuts, avocado, olive oil, canola oil, and fish.  Does everyone with diabetes mellitus have the same meal plan? Because every person with diabetes mellitus is different, there is not one meal plan that works for everyone. It is very important that you meet with a dietitian who will help you create a meal plan that is just right for you. This information is not intended to replace advice given to you by your health care provider. Make sure you discuss any questions you have with your health care provider. Document Released: 01/05/2005 Document Revised: 09/16/2015 Document Reviewed: 03/07/2013 [employer] Interactive Patient Education  2017 [employer]  

## 2017-01-02 NOTE — Progress Notes (Signed)
Subjective:  Patient ID: Shirley [name], female    DOB: 1975/09/24  Age: 41 y.o. MRN: [id number]  CC: ED f/u  HPI  Shirley [name] is a 41 y.o. female with multiple comorbidities presents on ED follow up of diabetic gastroparesis. Has been to the ED approximately 20 times in the last 6 months. Multiple CT done with no specific findings except of a large uterine fibroid.    Had uterine artery embolization by interventional radiology. She still has RLQ pain that she reports was made worse by the uterine artery embolization. Pain is "worse than having babies". Radiates to the lower right lumbar region. She will be following up with IR on 01/09/17. Appointment with GYN tomorrow to discuss fibroidectomy/hysterectomy. Had taken Norco despite being allergic to Morphine. Says Norco helped with the pain and she only experienced mild gastric upset.     Sinus pain and nasal congestion since three days ago. No close contacts with the same. Has taken Sinex with no relief. Believes she had a fever "once or twice".    ROS Review of Systems  Constitutional: Negative for chills, fever and malaise/fatigue.  HENT: Positive for congestion and sinus pain.   Eyes: Negative for blurred vision.  Respiratory: Negative for shortness of breath.   Cardiovascular: Negative for chest pain and palpitations.  Gastrointestinal: Positive for abdominal pain. Negative for nausea.       Gastroparesis  Genitourinary: Negative for dysuria and hematuria.  Musculoskeletal: Positive for back pain. Negative for joint pain and myalgias.  Skin: Negative for rash.  Neurological: Negative for tingling and headaches.  Psychiatric/Behavioral: Negative for depression. The patient is not nervous/anxious.     Objective:  LMP 12/26/2016   BP/Weight 01/01/2017 3/0/1601 0/12/3233  Systolic BP 573 220 254  Diastolic BP 97 89 90  Wt. (Lbs) - 210 -  BMI - 32.89 -      Physical Exam  Constitutional: She is oriented to person,  place, and time.  Well developed, obese, NAD  HENT:  Head: Normocephalic and atraumatic.  Mouth/Throat: No oropharyngeal exudate.  Turbinates hypertrophic. Mild maxillary sinus TTP  Eyes: Conjunctivae are normal. No scleral icterus.  Neck: Normal range of motion. Neck supple. No thyromegaly present.  Cardiovascular: Normal rate, regular rhythm and normal heart sounds.   Pulmonary/Chest: Effort normal and breath sounds normal. She has no wheezes. She has no rales.  Abdominal: Soft. Bowel sounds are normal. There is tenderness (RLQ TTP).  Musculoskeletal: She exhibits no edema.  Lymphadenopathy:    She has no cervical adenopathy.  Neurological: She is alert and oriented to person, place, and time. No cranial nerve deficit. Coordination normal.  Normal gait  Skin: Skin is warm and dry. No rash noted. No erythema. No pallor.  Psychiatric: She has a normal mood and affect. Her behavior is normal. Thought content normal.  Vitals reviewed.    Assessment & Plan:    1. Gastroparesis - Failed on Reglan - Begin erythromycin ethylsuccinate (EES) 200 MG/5ML suspension; Take 5 mLs (200 mg total) by mouth 3 (three) times daily. Take 15 minutes before each meal.  Dispense: 420 mL; Refill: 0  2.Type 2 diabetes mellitus with other specified complication, without long-term current use of insulin (HCC) - HgB A1c 9.6% today in clinic. Increased from 8.3% in 08/2016. - Increase insulin NPH-regular Human (NOVOLIN 70/30) (70-30) 100 UNIT/ML injection; 35 units in the morning and 35 units in the evening.  Dispense: 10 mL; Refill: 11 - Refill INSULIN SYRINGE .5CC/28G (INS  SYRINGE/NEEDLE .5CC/28G) 28G X 1/2" 0.5 ML MISC; Please provide 1 month supply  Dispense: 100 each; Refill: 11  - Refill Lancets (FREESTYLE) lancets; Use as instructed  Dispense: 100 each; Refill: 11  3. Right lower quadrant abdominal pain - Begin HYDROcodone-acetaminophen (NORCO) 10-325 MG tablet; Take 1 tablet by mouth every 8 (eight)  hours as needed.  Dispense: 21 tablet; Refill: 0. I have advised patient that I can no longer prescribe any narcotic to her based on our policy for treating chronic pain. Patient understood. - CBC with Differential  4. Acute non-recurrent maxillary sinusitis - Begin DM-APAP-CPM (CORICIDIN HBP FLU) 15-500-2 MG TABS; Take 2 tablets by mouth every 6 (six) hours.  Dispense: 1 each; Refill: 0      Meds ordered this encounter  Medications  . erythromycin ethylsuccinate (EES) 200 MG/5ML suspension    Sig: Take 5 mLs (200 mg total) by mouth 3 (three) times daily. Take 15 minutes before each meal.    Dispense:  420 mL    Refill:  0    Order Specific Question:   Supervising Provider    Answer:   Tresa Garter [id number]  . DM-APAP-CPM (CORICIDIN HBP FLU) 15-500-2 MG TABS    Sig: Take 2 tablets by mouth every 6 (six) hours.    Dispense:  1 each    Refill:  0    Order Specific Question:   Supervising Provider    Answer:   Tresa Garter [id number]  . HYDROcodone-acetaminophen (NORCO) 10-325 MG tablet    Sig: Take 1 tablet by mouth every 8 (eight) hours as needed.    Dispense:  21 tablet    Refill:  0    Order Specific Question:   Supervising Provider    Answer:   Tresa Garter [id number]  . insulin NPH-regular Human (NOVOLIN 70/30) (70-30) 100 UNIT/ML injection    Sig: 35 units in the morning and 35 units in the evening.    Dispense:  10 mL    Refill:  11    Order Specific Question:   Supervising Provider    Answer:   Tresa Garter [id number]  . INSULIN SYRINGE .5CC/28G (INS SYRINGE/NEEDLE .5CC/28G) 28G X 1/2" 0.5 ML MISC    Sig: Please provide 1 month supply    Dispense:  100 each    Refill:  11    Substitution allowed    Order Specific Question:   Supervising Provider    Answer:   Tresa Garter [[id number]]  . Lancets (FREESTYLE) lancets    Sig: Use as instructed    Dispense:  100 each    Refill:  11    Substitution allowed    Order Specific Question:    Supervising Provider    Answer:   Tresa Garter [[id number]]    Follow-up: Return in about 14 days (around 01/16/2017) for Diabetes and gastroparesis.   Clent Demark PA

## 2017-01-03 ENCOUNTER — Telehealth (INDEPENDENT_AMBULATORY_CARE_PROVIDER_SITE_OTHER): Payer: Self-pay

## 2017-01-03 ENCOUNTER — Encounter: Payer: Self-pay | Admitting: Obstetrics & Gynecology

## 2017-01-03 ENCOUNTER — Ambulatory Visit (INDEPENDENT_AMBULATORY_CARE_PROVIDER_SITE_OTHER): Payer: Self-pay | Admitting: Obstetrics & Gynecology

## 2017-01-03 VITALS — BP 134/96 | HR 98 | Ht 67.0 in | Wt 212.7 lb

## 2017-01-03 DIAGNOSIS — D251 Intramural leiomyoma of uterus: Secondary | ICD-10-CM

## 2017-01-03 DIAGNOSIS — D252 Subserosal leiomyoma of uterus: Secondary | ICD-10-CM

## 2017-01-03 DIAGNOSIS — D250 Submucous leiomyoma of uterus: Secondary | ICD-10-CM

## 2017-01-03 LAB — CBC WITH DIFFERENTIAL/PLATELET
BASOS: 0 %
Basophils Absolute: 0 10*3/uL (ref 0.0–0.2)
EOS (ABSOLUTE): 0.7 10*3/uL — ABNORMAL HIGH (ref 0.0–0.4)
EOS: 6 %
HEMOGLOBIN: 11.4 g/dL (ref 11.1–15.9)
Hematocrit: 35.1 % (ref 34.0–46.6)
IMMATURE GRANULOCYTES: 0 %
Immature Grans (Abs): 0 10*3/uL (ref 0.0–0.1)
Lymphocytes Absolute: 2.5 10*3/uL (ref 0.7–3.1)
Lymphs: 24 %
MCH: 26.1 pg — ABNORMAL LOW (ref 26.6–33.0)
MCHC: 32.5 g/dL (ref 31.5–35.7)
MCV: 81 fL (ref 79–97)
MONOCYTES: 6 %
MONOS ABS: 0.6 10*3/uL (ref 0.1–0.9)
NEUTROS PCT: 64 %
Neutrophils Absolute: 6.6 10*3/uL (ref 1.4–7.0)
PLATELETS: 413 10*3/uL — AB (ref 150–379)
RBC: 4.36 x10E6/uL (ref 3.77–5.28)
RDW: 17.8 % — AB (ref 12.3–15.4)
WBC: 10.4 10*3/uL (ref 3.4–10.8)

## 2017-01-03 MED ORDER — HYDROMORPHONE HCL 2 MG PO TABS: 2.0000 mg | ORAL_TABLET | ORAL | 0 refills | Status: DC | PRN

## 2017-01-03 MED ORDER — ZOLPIDEM TARTRATE 5 MG PO TABS: 5.0000 mg | ORAL_TABLET | Freq: Every evening | ORAL | 1 refills | Status: DC | PRN

## 2017-01-03 MED FILL — !NOVOLIN 70/30 100 UNITS/ML: (70-30) 100 | 14 days supply | Qty: 10 | Fill #0

## 2017-01-03 NOTE — Patient Instructions (Signed)
Uterine Artery Embolization for Fibroids, Care After Refer to this sheet in the next few weeks. These instructions provide you with information on caring for yourself after your procedure. Your health care provider may also give you more specific instructions. Your treatment has been planned according to current medical practices, but problems sometimes occur. Call your health care provider if you have any problems or questions after your procedure. What can I expect after the procedure? After your procedure, it is typical to have cramping in the pelvis. You will be given pain medicine to control it. Follow these instructions at home:  Only take over-the-counter or prescription medicines for pain, discomfort, or fever as directed by your health care provider.  Do not take aspirin. It can cause bleeding.  Follow your health care provider's advice regarding medicines given to you, diet, activity, and when to begin sexual activity.  See your health care provider for follow-up care as directed. Contact a health care provider if:  You have a fever.  You have redness, swelling, and pain around your incision site.  You have pus draining from your incision.  You have a rash. Get help right away if:  You have bleeding from your incision site.  You have difficulty breathing.  You have chest pain.  You have severe abdominal pain.  You have leg pain.  You become dizzy and faint. This information is not intended to replace advice given to you by your health care provider. Make sure you discuss any questions you have with your health care provider. Document Released: 01/29/2013 Document Revised: 09/16/2015 Document Reviewed: 10/24/2012 Elsevier Interactive Patient Education  2018 [employer]. Uterine Artery Embolization for Fibroids Uterine artery embolization is a nonsurgical treatment to shrink fibroids. A thin plastic tube (catheter) is used to inject material that blocks off the blood  supply to the fibroid, which causes the fibroid to shrink. Tell a health care provider about:  Any allergies you have.  All medicines you are taking, including vitamins, herbs, eye drops, creams, and over-the-counter medicines.  Any problems you or family members have had with anesthetic medicines.  Any blood disorders you have.  Any surgeries you have had.  Any medical conditions you have. What are the risks?  Injury to the uterus from decreased blood supply  Infection.  Blood infection (septicemia).  Lack of menstrual periods (amenorrhea).  Death of tissue cells (necrosis) around your bladder or vulva.  Development of a hole between organs or from an organ to the surface of your skin (fistula).  Blood clot in the legs (deep vein thrombosis) or lung (pulmonary embolus). What happens before the procedure?  Ask your health care provider about changing or stopping your regular medicines.  Do not take aspirin or blood thinners (anticoagulants) for 1 week before the surgery or as directed by your health care provider.  Do not eat or drink anything for 8 hours before the surgery or as directed by your health care provider.  Empty your bladder before the procedure begins. What happens during the procedure?  An IV tube will be placed into one of your veins. This will be used to give you a sedative and pain medication (conscious sedation).  You will be given a medicine that numbs the area (local anesthetic).  A small cut will be made in your groin. A catheter is then inserted into the main artery of your leg.  The catheter will be guided through the artery to your uterus. A series of images will be  taken while dye is injected through the catheter in your groin. X-rays are taken at the same time. This is done to provide a road map of the blood supply to your uterus and fibroids.  Tiny plastic spheres, about the size of sand grains, will be injected through the catheter. Metal  coils may be used to help block the artery. The particles will lodge in tiny branches of the uterine artery that supplies blood to the fibroids.  The procedure is repeated on the artery that supplies the other side of the uterus.  The catheter is then removed and pressure is held to stop any bleeding. No stitches are needed.  A dressing is then placed over the cut (incision). What happens after the procedure?  You will be taken to a recovery area where your progress will be monitored until you are awake, stable, and taking fluids well. If there are no other problems, you will then be moved to a regular hospital room.  You will be observed overnight in the hospital.  You will have cramping that should be controlled with pain medication. This information is not intended to replace advice given to you by your health care provider. Make sure you discuss any questions you have with your health care provider. Document Released: 06/26/2005 Document Revised: 09/16/2015 Document Reviewed: 10/24/2012 Elsevier Interactive Patient Education  [employer].

## 2017-01-03 NOTE — Telephone Encounter (Signed)
-----   Message from Clent Demark, PA-C sent at 01/03/2017  1:46 PM EDT ----- No sign of infection and no longer anemic.

## 2017-01-03 NOTE — Progress Notes (Signed)
GAD score was elevated but patient declined to see Behavioral Clinician.

## 2017-01-03 NOTE — Progress Notes (Signed)
History:  42 y.o. G2P2000 here today for eval of AUB. Pt is s/p [place]. Pt reports that she was given #16 Norco after the procedure which did not help with the pain much. She was supposed to see the provider in f/u but, her visit has not occurred yet. She reports multiple ED visits due to pain since that time. She reports that she regrets having the procedure due to all of the pain.  She reports that she has been bleeding since the procedure but, it has decreased over the past 2-3 days.    The following portions of the patient's history were reviewed and updated as appropriate: allergies, current medications, past family history, past medical history, past social history, past surgical history and problem list.  Review of Systems:  Pertinent items are noted in HPI.   Objective:  Physical Exam Last menstrual period 12/26/2016.  CONSTITUTIONAL: Well-developed, well-nourished female in no acute distress.  HENT:  Normocephalic, atraumatic EYES: Conjunctivae and EOM are normal. No scleral icterus.  NECK: Normal range of motion SKIN: Skin is warm and dry. No rash noted. Not diaphoretic.No pallor. Galien: Alert and oriented to person, place, and time. Normal coordination.  Abd: Soft, nondistended; her abd is tender over her uterus but, not markedly so.  Pelvic: deferred; would expect tenderness post [place]  Labs and Imaging Mr Pelvis W Wo Contrast  Result Date: 12/05/2016 CLINICAL DATA:  Pelvic pain and vaginal bleeding. EXAM: MRI PELVIS WITHOUT AND WITH CONTRAST TECHNIQUE: Multiplanar multisequence MR imaging of the pelvis was performed both before and after administration of intravenous contrast. CONTRAST:  31m MULTIHANCE GADOBENATE DIMEGLUMINE 529 MG/ML IV SOLN COMPARISON:  Pelvic ultrasound 12/02/2016 and CT scan 11/29/2016 FINDINGS: Examination is quite limited due to patient motion. Patient would not or could not hold still for the examination. Urinary Tract: The bladder appears normal. No  bladder lesions or asymmetric bladder wall thickening. No ureteral dilatation. Bowel:  The visualized small bowel and colon are grossly normal. Vascular/Lymphatic: The major vascular structures appear patent. Reproductive: As demonstrated on the prior imaging studies there is a dominant posterior uterine fibroids centered in the myometrium. This measures approximately 6.4 x 6.2 x 5.4 cm. There is some mass effect on the endometrium versus displaced anteriorly and to the left. The endometrium is normal in thickness. No endometrial lesions are identified. The junctional zone appears slightly thickened and slightly nodular but no definite findings for adenomyomatosis. The cervix appears normal. Both ovaries appear normal. Other:  No ascites.  No inguinal mass or adenopathy. Musculoskeletal: No significant bony findings. IMPRESSION: 1. 6 cm uterine fibroid with mild mass effect on the endometrium. Otherwise the endometrium appears normal. 2. Normal ovaries. 3. No pelvic lymphadenopathy. Electronically Signed   By: PMarijo SanesM.D.   On: 12/05/2016 18:36   Ct Abdomen Pelvis W Contrast  Result Date: 12/24/2016 CLINICAL DATA:  41y/o F; abdominal pain, nausea, and acute vomiting. EXAM: CT ABDOMEN AND PELVIS WITH CONTRAST TECHNIQUE: Multidetector CT imaging of the abdomen and pelvis was performed using the standard protocol following bolus administration of intravenous contrast. CONTRAST:  100 cc Isovue-300 COMPARISON:  12/16/2016 CT abdomen and pelvis. FINDINGS: Lower chest: No acute abnormality. Hepatobiliary: No focal liver abnormality is seen. No gallstones, gallbladder wall thickening, or biliary dilatation. Pancreas: Unremarkable. No pancreatic ductal dilatation or surrounding inflammatory changes. Spleen: Normal in size without focal abnormality. Adrenals/Urinary Tract: Adrenal glands are unremarkable. Kidneys are normal, without renal calculi, focal lesion, or hydronephrosis. Bladder is unremarkable.  Stomach/Bowel: Stomach  is within normal limits. Appendix appears normal. No evidence of bowel wall thickening, distention, or inflammatory changes. Vascular/Lymphatic: No significant vascular findings are present. No enlarged abdominal or pelvic lymph nodes. Reproductive: Interval hypoattenuation of 6 cm exophytic uterine fibroma compatible with necrosis and posttreatment changes. No adnexal mass identified. Other: No abdominal wall hernia or abnormality. No abdominopelvic ascites. Musculoskeletal: No fracture is seen. The degenerative changes the spine predominantly at the L5-S1 level were disc and facet disease results in moderate bilateral foraminal stenosis and canal stenosis. IMPRESSION: 1. Interval central hypoattenuation of large exophytic uterine fibroid compatible with necrosis and posttreatment changes. 2. Otherwise unremarkable CT of the abdomen and pelvis. Electronically Signed   By: Kristine Garbe M.D.   On: 12/24/2016 04:43   Ir Angiogram Pelvis Selective Or Supraselective  Result Date: 12/07/2016 INDICATION: Symptomatic uterine fibroids. Patient recently started on anti coagulation secondary DVT and pulmonary embolism with associated worsening of her menorrhagia. As such, patient presents now for urine fibroid embolization. EXAM: Uterine Fibroid Embolization MEDICATIONS: Toradol 30 mg IV; Ancef 2 gm IV. The antibiotic was administered within one hour of the procedure ANESTHESIA/SEDATION: Moderate (conscious) sedation was employed during this procedure. A total of Versed 3 mg and Fentanyl 200 mcg was administered intravenously. Moderate Sedation Time: 84 minutes. [name] level of consciousness and vital signs were monitored continuously by radiology nursing throughout the procedure under my direct supervision. CONTRAST:  80 cc Isovue 300 FLUOROSCOPY TIME:  16 minutes 36 seconds (5,462 mGy) COMPLICATIONS: None immediate. PROCEDURE: Informed consent was obtained from the patient  following explanation of the procedure, risks, benefits and alternatives. The patient understands, agrees and consents for the procedure. All questions were addressed. A time out was performed prior to the initiation of the procedure. Maximal barrier sterile technique utilized including caps, mask, sterile gowns, sterile gloves, large sterile drape, hand hygiene, and Betadine prep. The right femoral head was marked fluoroscopically. Under sterile conditions and local anesthesia, the right common femoral artery access was performed with a micropuncture needle. Under direct ultrasound guidance, the right common femoral was accessed with a micropuncture kit. An ultrasound image was saved for documentation purposes. This allowed for placement of a 5-French vascular sheath. A limited arteriogram was performed through the side arm of the sheath confirming appropriate access within the right common femoral artery. The 5-French C2 catheter was utilized to select the contralateral left internal iliac artery. Selective left internal iliac angiogram was performed. The tortuous left uterine artery was identified. Selective catheterization was performed of the left uterine artery with a microcatheter and micro guide wire. A selective left uterine angiogram was performed. This demonstrated patency of the left uterine artery. Mild diffuse hypervascularity of the enlarged fibroid uterus. Access was adequate for embolization. For embolization, 1 vial of 500 - 700 micron Embospheres were injected into the left uterine artery. Post embolization angiogram confirms complete stasis of the left uterine vascular territory. Microcatheter was removed. The C2 catheter was retracted and utilized to select the right internal iliac artery. Selective right internal iliac angiogram was performed. The patent right uterine artery was identified. For selective catheterization, the micro catheter and guidewire were utilized to select the right uterine  artery. Selective right uterine angiogram was performed. This demonstrated patency of the right uterine artery. Catheter position was safe for embolization. Embolization was performed to complete stasis with injection of 0.5 vials of 500-700 micron Embospheres. Post embolization angiogram confirms complete stasis of the right uterine vascular territory. At this point, all wires,  catheters and sheaths were removed from the patient. Hemostasis was achieved at the right groin access site with The patient tolerated the procedure well without immediate post procedural complication. FINDINGS: Bilateral internal iliac arteriograms demonstrates conventional branching pattern with widely patent origins of the bilateral uterine arteries. Sub selective bilateral uterine arteriograms demonstrate co-dominant supply to a hypertrophied myomatous uterus. Completion arteriograms following bilateral uterine artery particle embolization demonstrates a technically excellent result with stasis of flow within the bilateral uterine vascular territories. IMPRESSION: Successful bilateral uterine artery embolization (U F E). PLAN: The patient will be seen in follow-up consultation at the interventional radiology clinic in approximately 3-4 weeks. Electronically Signed   By: Sandi Mariscal M.D.   On: 12/07/2016 12:53   Ir Angiogram Pelvis Selective Or Supraselective  Result Date: 12/07/2016 INDICATION: Symptomatic uterine fibroids. Patient recently started on anti coagulation secondary DVT and pulmonary embolism with associated worsening of her menorrhagia. As such, patient presents now for urine fibroid embolization. EXAM: Uterine Fibroid Embolization MEDICATIONS: Toradol 30 mg IV; Ancef 2 gm IV. The antibiotic was administered within one hour of the procedure ANESTHESIA/SEDATION: Moderate (conscious) sedation was employed during this procedure. A total of Versed 3 mg and Fentanyl 200 mcg was administered intravenously. Moderate Sedation  Time: 84 minutes. [name] level of consciousness and vital signs were monitored continuously by radiology nursing throughout the procedure under my direct supervision. CONTRAST:  80 cc Isovue 300 FLUOROSCOPY TIME:  16 minutes 36 seconds (5,462 mGy) COMPLICATIONS: None immediate. PROCEDURE: Informed consent was obtained from the patient following explanation of the procedure, risks, benefits and alternatives. The patient understands, agrees and consents for the procedure. All questions were addressed. A time out was performed prior to the initiation of the procedure. Maximal barrier sterile technique utilized including caps, mask, sterile gowns, sterile gloves, large sterile drape, hand hygiene, and Betadine prep. The right femoral head was marked fluoroscopically. Under sterile conditions and local anesthesia, the right common femoral artery access was performed with a micropuncture needle. Under direct ultrasound guidance, the right common femoral was accessed with a micropuncture kit. An ultrasound image was saved for documentation purposes. This allowed for placement of a 5-French vascular sheath. A limited arteriogram was performed through the side arm of the sheath confirming appropriate access within the right common femoral artery. The 5-French C2 catheter was utilized to select the contralateral left internal iliac artery. Selective left internal iliac angiogram was performed. The tortuous left uterine artery was identified. Selective catheterization was performed of the left uterine artery with a microcatheter and micro guide wire. A selective left uterine angiogram was performed. This demonstrated patency of the left uterine artery. Mild diffuse hypervascularity of the enlarged fibroid uterus. Access was adequate for embolization. For embolization, 1 vial of 500 - 700 micron Embospheres were injected into the left uterine artery. Post embolization angiogram confirms complete stasis of the left  uterine vascular territory. Microcatheter was removed. The C2 catheter was retracted and utilized to select the right internal iliac artery. Selective right internal iliac angiogram was performed. The patent right uterine artery was identified. For selective catheterization, the micro catheter and guidewire were utilized to select the right uterine artery. Selective right uterine angiogram was performed. This demonstrated patency of the right uterine artery. Catheter position was safe for embolization. Embolization was performed to complete stasis with injection of 0.5 vials of 500-700 micron Embospheres. Post embolization angiogram confirms complete stasis of the right uterine vascular territory. At this point, all wires, catheters and sheaths were  removed from the patient. Hemostasis was achieved at the right groin access site with The patient tolerated the procedure well without immediate post procedural complication. FINDINGS: Bilateral internal iliac arteriograms demonstrates conventional branching pattern with widely patent origins of the bilateral uterine arteries. Sub selective bilateral uterine arteriograms demonstrate co-dominant supply to a hypertrophied myomatous uterus. Completion arteriograms following bilateral uterine artery particle embolization demonstrates a technically excellent result with stasis of flow within the bilateral uterine vascular territories. IMPRESSION: Successful bilateral uterine artery embolization (U F E). PLAN: The patient will be seen in follow-up consultation at the interventional radiology clinic in approximately 3-4 weeks. Electronically Signed   By: Sandi Mariscal M.D.   On: 12/07/2016 12:53   Ir Angiogram Selective Each Additional Vessel  Result Date: 12/07/2016 INDICATION: Symptomatic uterine fibroids. Patient recently started on anti coagulation secondary DVT and pulmonary embolism with associated worsening of her menorrhagia. As such, patient presents now for urine  fibroid embolization. EXAM: Uterine Fibroid Embolization MEDICATIONS: Toradol 30 mg IV; Ancef 2 gm IV. The antibiotic was administered within one hour of the procedure ANESTHESIA/SEDATION: Moderate (conscious) sedation was employed during this procedure. A total of Versed 3 mg and Fentanyl 200 mcg was administered intravenously. Moderate Sedation Time: 84 minutes. [name] level of consciousness and vital signs were monitored continuously by radiology nursing throughout the procedure under my direct supervision. CONTRAST:  80 cc Isovue 300 FLUOROSCOPY TIME:  16 minutes 36 seconds (1,594 mGy) COMPLICATIONS: None immediate. PROCEDURE: Informed consent was obtained from the patient following explanation of the procedure, risks, benefits and alternatives. The patient understands, agrees and consents for the procedure. All questions were addressed. A time out was performed prior to the initiation of the procedure. Maximal barrier sterile technique utilized including caps, mask, sterile gowns, sterile gloves, large sterile drape, hand hygiene, and Betadine prep. The right femoral head was marked fluoroscopically. Under sterile conditions and local anesthesia, the right common femoral artery access was performed with a micropuncture needle. Under direct ultrasound guidance, the right common femoral was accessed with a micropuncture kit. An ultrasound image was saved for documentation purposes. This allowed for placement of a 5-French vascular sheath. A limited arteriogram was performed through the side arm of the sheath confirming appropriate access within the right common femoral artery. The 5-French C2 catheter was utilized to select the contralateral left internal iliac artery. Selective left internal iliac angiogram was performed. The tortuous left uterine artery was identified. Selective catheterization was performed of the left uterine artery with a microcatheter and micro guide wire. A selective left uterine  angiogram was performed. This demonstrated patency of the left uterine artery. Mild diffuse hypervascularity of the enlarged fibroid uterus. Access was adequate for embolization. For embolization, 1 vial of 500 - 700 micron Embospheres were injected into the left uterine artery. Post embolization angiogram confirms complete stasis of the left uterine vascular territory. Microcatheter was removed. The C2 catheter was retracted and utilized to select the right internal iliac artery. Selective right internal iliac angiogram was performed. The patent right uterine artery was identified. For selective catheterization, the micro catheter and guidewire were utilized to select the right uterine artery. Selective right uterine angiogram was performed. This demonstrated patency of the right uterine artery. Catheter position was safe for embolization. Embolization was performed to complete stasis with injection of 0.5 vials of 500-700 micron Embospheres. Post embolization angiogram confirms complete stasis of the right uterine vascular territory. At this point, all wires, catheters and sheaths were removed from the patient.  Hemostasis was achieved at the right groin access site with The patient tolerated the procedure well without immediate post procedural complication. FINDINGS: Bilateral internal iliac arteriograms demonstrates conventional branching pattern with widely patent origins of the bilateral uterine arteries. Sub selective bilateral uterine arteriograms demonstrate co-dominant supply to a hypertrophied myomatous uterus. Completion arteriograms following bilateral uterine artery particle embolization demonstrates a technically excellent result with stasis of flow within the bilateral uterine vascular territories. IMPRESSION: Successful bilateral uterine artery embolization (U F E). PLAN: The patient will be seen in follow-up consultation at the interventional radiology clinic in approximately 3-4 weeks.  Electronically Signed   By: Sandi Mariscal M.D.   On: 12/07/2016 12:53   Ir Angiogram Selective Each Additional Vessel  Result Date: 12/07/2016 INDICATION: Symptomatic uterine fibroids. Patient recently started on anti coagulation secondary DVT and pulmonary embolism with associated worsening of her menorrhagia. As such, patient presents now for urine fibroid embolization. EXAM: Uterine Fibroid Embolization MEDICATIONS: Toradol 30 mg IV; Ancef 2 gm IV. The antibiotic was administered within one hour of the procedure ANESTHESIA/SEDATION: Moderate (conscious) sedation was employed during this procedure. A total of Versed 3 mg and Fentanyl 200 mcg was administered intravenously. Moderate Sedation Time: 84 minutes. [name] level of consciousness and vital signs were monitored continuously by radiology nursing throughout the procedure under my direct supervision. CONTRAST:  80 cc Isovue 300 FLUOROSCOPY TIME:  16 minutes 36 seconds (0,768 mGy) COMPLICATIONS: None immediate. PROCEDURE: Informed consent was obtained from the patient following explanation of the procedure, risks, benefits and alternatives. The patient understands, agrees and consents for the procedure. All questions were addressed. A time out was performed prior to the initiation of the procedure. Maximal barrier sterile technique utilized including caps, mask, sterile gowns, sterile gloves, large sterile drape, hand hygiene, and Betadine prep. The right femoral head was marked fluoroscopically. Under sterile conditions and local anesthesia, the right common femoral artery access was performed with a micropuncture needle. Under direct ultrasound guidance, the right common femoral was accessed with a micropuncture kit. An ultrasound image was saved for documentation purposes. This allowed for placement of a 5-French vascular sheath. A limited arteriogram was performed through the side arm of the sheath confirming appropriate access within the right  common femoral artery. The 5-French C2 catheter was utilized to select the contralateral left internal iliac artery. Selective left internal iliac angiogram was performed. The tortuous left uterine artery was identified. Selective catheterization was performed of the left uterine artery with a microcatheter and micro guide wire. A selective left uterine angiogram was performed. This demonstrated patency of the left uterine artery. Mild diffuse hypervascularity of the enlarged fibroid uterus. Access was adequate for embolization. For embolization, 1 vial of 500 - 700 micron Embospheres were injected into the left uterine artery. Post embolization angiogram confirms complete stasis of the left uterine vascular territory. Microcatheter was removed. The C2 catheter was retracted and utilized to select the right internal iliac artery. Selective right internal iliac angiogram was performed. The patent right uterine artery was identified. For selective catheterization, the micro catheter and guidewire were utilized to select the right uterine artery. Selective right uterine angiogram was performed. This demonstrated patency of the right uterine artery. Catheter position was safe for embolization. Embolization was performed to complete stasis with injection of 0.5 vials of 500-700 micron Embospheres. Post embolization angiogram confirms complete stasis of the right uterine vascular territory. At this point, all wires, catheters and sheaths were removed from the patient. Hemostasis was achieved at  the right groin access site with The patient tolerated the procedure well without immediate post procedural complication. FINDINGS: Bilateral internal iliac arteriograms demonstrates conventional branching pattern with widely patent origins of the bilateral uterine arteries. Sub selective bilateral uterine arteriograms demonstrate co-dominant supply to a hypertrophied myomatous uterus. Completion arteriograms following bilateral  uterine artery particle embolization demonstrates a technically excellent result with stasis of flow within the bilateral uterine vascular territories. IMPRESSION: Successful bilateral uterine artery embolization (U F E). PLAN: The patient will be seen in follow-up consultation at the interventional radiology clinic in approximately 3-4 weeks. Electronically Signed   By: Sandi Mariscal M.D.   On: 12/07/2016 12:53   Ir US Guide Vasc Access Right  Result Date: 12/07/2016 INDICATION: Symptomatic uterine fibroids. Patient recently started on anti coagulation secondary DVT and pulmonary embolism with associated worsening of her menorrhagia. As such, patient presents now for urine fibroid embolization. EXAM: Uterine Fibroid Embolization MEDICATIONS: Toradol 30 mg IV; Ancef 2 gm IV. The antibiotic was administered within one hour of the procedure ANESTHESIA/SEDATION: Moderate (conscious) sedation was employed during this procedure. A total of Versed 3 mg and Fentanyl 200 mcg was administered intravenously. Moderate Sedation Time: 84 minutes. [name] level of consciousness and vital signs were monitored continuously by radiology nursing throughout the procedure under my direct supervision. CONTRAST:  80 cc Isovue 300 FLUOROSCOPY TIME:  16 minutes 36 seconds (2,542 mGy) COMPLICATIONS: None immediate. PROCEDURE: Informed consent was obtained from the patient following explanation of the procedure, risks, benefits and alternatives. The patient understands, agrees and consents for the procedure. All questions were addressed. A time out was performed prior to the initiation of the procedure. Maximal barrier sterile technique utilized including caps, mask, sterile gowns, sterile gloves, large sterile drape, hand hygiene, and Betadine prep. The right femoral head was marked fluoroscopically. Under sterile conditions and local anesthesia, the right common femoral artery access was performed with a micropuncture needle. Under  direct ultrasound guidance, the right common femoral was accessed with a micropuncture kit. An ultrasound image was saved for documentation purposes. This allowed for placement of a 5-French vascular sheath. A limited arteriogram was performed through the side arm of the sheath confirming appropriate access within the right common femoral artery. The 5-French C2 catheter was utilized to select the contralateral left internal iliac artery. Selective left internal iliac angiogram was performed. The tortuous left uterine artery was identified. Selective catheterization was performed of the left uterine artery with a microcatheter and micro guide wire. A selective left uterine angiogram was performed. This demonstrated patency of the left uterine artery. Mild diffuse hypervascularity of the enlarged fibroid uterus. Access was adequate for embolization. For embolization, 1 vial of 500 - 700 micron Embospheres were injected into the left uterine artery. Post embolization angiogram confirms complete stasis of the left uterine vascular territory. Microcatheter was removed. The C2 catheter was retracted and utilized to select the right internal iliac artery. Selective right internal iliac angiogram was performed. The patent right uterine artery was identified. For selective catheterization, the micro catheter and guidewire were utilized to select the right uterine artery. Selective right uterine angiogram was performed. This demonstrated patency of the right uterine artery. Catheter position was safe for embolization. Embolization was performed to complete stasis with injection of 0.5 vials of 500-700 micron Embospheres. Post embolization angiogram confirms complete stasis of the right uterine vascular territory. At this point, all wires, catheters and sheaths were removed from the patient. Hemostasis was achieved at the right groin access  site with The patient tolerated the procedure well without immediate post procedural  complication. FINDINGS: Bilateral internal iliac arteriograms demonstrates conventional branching pattern with widely patent origins of the bilateral uterine arteries. Sub selective bilateral uterine arteriograms demonstrate co-dominant supply to a hypertrophied myomatous uterus. Completion arteriograms following bilateral uterine artery particle embolization demonstrates a technically excellent result with stasis of flow within the bilateral uterine vascular territories. IMPRESSION: Successful bilateral uterine artery embolization (U F E). PLAN: The patient will be seen in follow-up consultation at the interventional radiology clinic in approximately 3-4 weeks. Electronically Signed   By: Sandi Mariscal M.D.   On: 12/07/2016 12:53   Ir Embo Arterial Not [place] Guide Roadmapping  Result Date: 12/07/2016 INDICATION: Symptomatic uterine fibroids. Patient recently started on anti coagulation secondary DVT and pulmonary embolism with associated worsening of her menorrhagia. As such, patient presents now for urine fibroid embolization. EXAM: Uterine Fibroid Embolization MEDICATIONS: Toradol 30 mg IV; Ancef 2 gm IV. The antibiotic was administered within one hour of the procedure ANESTHESIA/SEDATION: Moderate (conscious) sedation was employed during this procedure. A total of Versed 3 mg and Fentanyl 200 mcg was administered intravenously. Moderate Sedation Time: 84 minutes. [name] level of consciousness and vital signs were monitored continuously by radiology nursing throughout the procedure under my direct supervision. CONTRAST:  80 cc Isovue 300 FLUOROSCOPY TIME:  16 minutes 36 seconds (2,248 mGy) COMPLICATIONS: None immediate. PROCEDURE: Informed consent was obtained from the patient following explanation of the procedure, risks, benefits and alternatives. The patient understands, agrees and consents for the procedure. All questions were addressed. A time out was performed prior to the initiation of  the procedure. Maximal barrier sterile technique utilized including caps, mask, sterile gowns, sterile gloves, large sterile drape, hand hygiene, and Betadine prep. The right femoral head was marked fluoroscopically. Under sterile conditions and local anesthesia, the right common femoral artery access was performed with a micropuncture needle. Under direct ultrasound guidance, the right common femoral was accessed with a micropuncture kit. An ultrasound image was saved for documentation purposes. This allowed for placement of a 5-French vascular sheath. A limited arteriogram was performed through the side arm of the sheath confirming appropriate access within the right common femoral artery. The 5-French C2 catheter was utilized to select the contralateral left internal iliac artery. Selective left internal iliac angiogram was performed. The tortuous left uterine artery was identified. Selective catheterization was performed of the left uterine artery with a microcatheter and micro guide wire. A selective left uterine angiogram was performed. This demonstrated patency of the left uterine artery. Mild diffuse hypervascularity of the enlarged fibroid uterus. Access was adequate for embolization. For embolization, 1 vial of 500 - 700 micron Embospheres were injected into the left uterine artery. Post embolization angiogram confirms complete stasis of the left uterine vascular territory. Microcatheter was removed. The C2 catheter was retracted and utilized to select the right internal iliac artery. Selective right internal iliac angiogram was performed. The patent right uterine artery was identified. For selective catheterization, the micro catheter and guidewire were utilized to select the right uterine artery. Selective right uterine angiogram was performed. This demonstrated patency of the right uterine artery. Catheter position was safe for embolization. Embolization was performed to complete stasis with injection  of 0.5 vials of 500-700 micron Embospheres. Post embolization angiogram confirms complete stasis of the right uterine vascular territory. At this point, all wires, catheters and sheaths were removed from the patient. Hemostasis was achieved at the right groin access  site with The patient tolerated the procedure well without immediate post procedural complication. FINDINGS: Bilateral internal iliac arteriograms demonstrates conventional branching pattern with widely patent origins of the bilateral uterine arteries. Sub selective bilateral uterine arteriograms demonstrate co-dominant supply to a hypertrophied myomatous uterus. Completion arteriograms following bilateral uterine artery particle embolization demonstrates a technically excellent result with stasis of flow within the bilateral uterine vascular territories. IMPRESSION: Successful bilateral uterine artery embolization (U F E). PLAN: The patient will be seen in follow-up consultation at the interventional radiology clinic in approximately 3-4 weeks. Electronically Signed   By: Sandi Mariscal M.D.   On: 12/07/2016 12:53   Ct Angio Abd/pel W And/or Wo Contrast  Result Date: 12/16/2016 CLINICAL DATA:  Abdominal pain. Uterine artery embolization 1 week prior. EXAM: CTA ABDOMEN AND PELVIS wITHOUT AND WITH CONTRAST TECHNIQUE: Multidetector CT imaging of the abdomen and pelvis was performed using the standard protocol during bolus administration of intravenous contrast. Multiplanar reconstructed images and MIPs were obtained and reviewed to evaluate the vascular anatomy. CONTRAST:  100 cc Isovue 370 IV COMPARISON:  Abdominal CT 11/29/2016, multiple priors FINDINGS: VASCULAR Suboptimal arterial bolus timing. Examination remains diagnostic for evaluation of dissection Aorta: Normal caliber aorta without aneurysm, dissection, vasculitis or significant stenosis. Celiac: Patent without evidence of aneurysm, dissection, vasculitis or significant stenosis. SMA: Patent  without evidence of aneurysm, dissection, vasculitis or significant stenosis. Renals: Both renal arteries are patent without evidence of aneurysm, dissection, vasculitis, fibromuscular dysplasia or significant stenosis. IMA: Patent without evidence of aneurysm, dissection, vasculitis or significant stenosis. Inflow: Patent without evidence of aneurysm, dissection, vasculitis or significant stenosis. Proximal Outflow: Bilateral common femoral and visualized portions of the superficial and profunda femoral arteries are patent without evidence of aneurysm, dissection, vasculitis or significant stenosis. Veins: No obvious venous abnormality within the limitations of this arterial phase study. Review of the MIP images confirms the above findings. NON-VASCULAR Lower chest: The lung bases are clear. Hepatobiliary: No focal hepatic lesion. Gallbladder physiologically distended, no calcified stone. No biliary dilatation. Pancreas: No ductal dilatation or inflammation. Spleen: Normal in size without focal abnormality. Splenule inferiorly. Adrenals/Urinary Tract: No adrenal nodule. No hydronephrosis or perinephric edema. Urinary bladder is physiologically distended. Stomach/Bowel: Stomach is within normal limits. Appendix appears normal. No evidence of bowel wall thickening, distention, or inflammatory changes. Lymphatic: Small bilateral external iliac nodes, unchanged from prior exam. No new or progressive adenopathy. Reproductive: There is central air within the uterine fibroid, a normal finding post recent uterine artery embolization. No adnexal mass. Other: No free air, free fluid, or intra-abdominal fluid collection. Musculoskeletal: There are no acute or suspicious osseous abnormalities. IMPRESSION: VASCULAR No acute vascular abnormality post recent uterine artery embolization. NON-VASCULAR Expected appearance of the uterine fibroid post embolization. No evident complication. No additional acute abnormality in the  abdomen/pelvis. Electronically Signed   By: Jeb Levering M.D.   On: 12/16/2016 03:56    Assessment & Plan:  Uterine fibroids and AUB. S/p [place] 12/07/2016. Chart reviewed   Dilaudid 68m q 4 hours prn #30 Ambien 5 mg 1 po q hs Pt notified that she will not get refills after this as this is to help with the immediate pain which should be resolving.  F/u in 1 week to reassess sx Discussed with pt trying to limit ED visits for this issue and to try to keep her scheduled visit.    Total face-to-face time with patient was 20 min.  Greater than 50% was spent in counseling and coordination of care with  the patient.   Nilay Mangrum L. Harraway-Smith, M.D., Cherlynn June

## 2017-01-03 NOTE — Telephone Encounter (Signed)
Left message asking patient to call office back. If patient calls and clinic staff is busy please inform patient that THERE IS NO SIGNS OF INFECTION AND THAT SHE IS NO LONGER ANEMIC. Nat Christen, CMA

## 2017-01-08 ENCOUNTER — Encounter (INDEPENDENT_AMBULATORY_CARE_PROVIDER_SITE_OTHER): Payer: Self-pay

## 2017-01-09 ENCOUNTER — Ambulatory Visit
Admission: RE | Admit: 2017-01-09 | Discharge: 2017-01-09 | Disposition: A | Discharge status: Home / Self Care | Payer: Medicaid Other | Source: Ambulatory Visit | Attending: Interventional Radiology | Admitting: Interventional Radiology

## 2017-01-09 DIAGNOSIS — D250 Submucous leiomyoma of uterus: Secondary | ICD-10-CM

## 2017-01-09 HISTORY — PX: IR RADIOLOGIST EVAL & MGMT: IMG5224

## 2017-01-09 NOTE — Progress Notes (Signed)
Patient ID: Shirley [name], female   DOB: Jan 04, 1976, 41 y.o.   MRN: [id number]         Chief Complaint: Post UFE  Referring Physician(s): Harraway-Smith (Ob-Gyn) Clent Demark (PCP)  History of Present Illness: Shirley [name] is a 41 y.o. female with past medical history significant for CAD (post myocardial infarction) diabetes, CVA and hepatitis B who was found to have a DVT/pulmonary embolism in June and was subsequently placed on Eliquis.  Patient has a long history of menorrhagia secondary to fibroids, however this significantly worsened after being placed on anticoagulation.  As such, uterine artery embolization was performed on 12/07/2016 while she was admitted at [facility] for the purposes of worsening menorrhagia.  The patient presents to the interventional radiology clinic today for post procedural evaluation and management. She is unaccompanied and serves as her own historian.  Unfortunately, the patient states that she has experienced severe right lower quadrant/pelvic abdominal pain since performing the uterine fibroid embolization (note, the patient's uterus is displaced into the right hemipelvis), with multiple emergency room visits including the acquisition of 2 post procedural abdominal CT's performed on 12/24/2016 and 12/16/2016, both of which were negative for evidence of complication with expected appearance of the uterus following embolization.    While the patient states that her menorrhagia improved for approximately 2-3 days following the embolization and while she has noted decreased passage of blood clots, she still reports experiencing nearly daily bleeding. Patient denies fever or chills.  Unfortunately, the patient continues to complain for right lower extremity pain and asymmetric edema which she attributes to her DVT.  Note, the patient has not had another venous doppler US since being placed on anticoagulation.   Past Medical History:  Diagnosis  Date  . Abscess of tunica vaginalis    10/09- Abundant S. aureus- sensitive to all abx  . Anxiety   . Blood dyscrasia   . CAD (coronary artery disease) 06/15/2006   s/p Subendocardial MI with PDA angioplasty(no stent) on 06/15/06 and relook  cath 06/19/06 showed patency of site. Cath 12/10- no restenosis or significant CAD progression  . CVA (cerebral vascular accident) ([place]) ~ 02/2014   denies residual on 04/22/2014  . CVA (cerebral vascular accident) [facility])    history of remote right cerebellar infarct noted on head CT at least since 10/2011  . Depression   . Diabetes mellitus type 2, uncontrolled, with complications ([place])   . Fibromyalgia   . Gastritis   . Gastroparesis    secondary to poorly controlled DM, last emptying study performed 01/2010  was normal but may be falsely positive as pt was on reglan  . GERD (gastroesophageal reflux disease)   . Hepatitis B, chronic (HCC)    Hep BeAb+,Hep B cAb+ & Hep BsAg+ (9/06)  . History of pyelonephritis    H/o GrpB Pyelonephritis (9/06) and UTI- 07/11- E.Coli, 12/10- GBS  . Hyperlipidemia   . Hypertension   . Iron deficiency anemia   . Irregular menses    Small ovarian follicles seen on GU(5/42)  . MI (myocardial infarction) ([place]) 05/2006   PDA percutaneous transluminal coronary angioplasty  . Migraine    "weekly" (04/22/2014)  . N&V (nausea and vomiting)    Chronic. Unclear etiology with multiple admission and ED visits. CT abdomen with and without contrast (02/2011)  showed no acute process. Gastic Emptying scan (01/2010) was normal. Ultrasound of the abdomen was within normal limits. Hepatitis B viral load was undectable. HIV NR. EGD -  gastritis, Hpylori + s/p Rx  . Obesity   . OSA (obstructive sleep apnea)    "suppose to wear mask but I don't" (04/22/2014)  . Peripheral neuropathy   . Pneumonia    "this is probably the 2nd or 3rd time I've had pneumonia" (04/22/2014)  . Recurrent boils   . Seasonal asthma   . Substance abuse   .  Thrombocytosis ([place])    Hem/Onc suggested 2/2 chronic hepatits and/or iron deficiency anemia    Past Surgical History:  Procedure Laterality Date  . CESAREAN SECTION  1997  . CORONARY ANGIOPLASTY WITH STENT PLACEMENT  2008   "2 stents"  . ESOPHAGOGASTRODUODENOSCOPY N/A 04/23/2014   Procedure: ESOPHAGOGASTRODUODENOSCOPY (EGD);  Surgeon: Winfield Cunas., MD;  Location: [facility] ENDOSCOPY;  Service: Endoscopy;  Laterality: N/A;  . IR ANGIOGRAM PELVIS SELECTIVE OR SUPRASELECTIVE  12/07/2016  . IR ANGIOGRAM PELVIS SELECTIVE OR SUPRASELECTIVE  12/07/2016  . IR ANGIOGRAM SELECTIVE EACH ADDITIONAL VESSEL  12/07/2016  . IR ANGIOGRAM SELECTIVE EACH ADDITIONAL VESSEL  12/07/2016  . IR EMBO ARTERIAL NOT HEMORR HEMANG INC GUIDE ROADMAPPING  12/07/2016  . IR US GUIDE VASC ACCESS RIGHT  12/07/2016    Allergies: Lisinopril and Morphine and related  Medications: Prior to Admission medications   Medication Sig Start Date End Date Taking? Authorizing Provider  albuterol (PROVENTIL HFA;VENTOLIN HFA) 108 (90 Base) MCG/ACT inhaler Inhale 2 puffs into the lungs every 4 (four) hours as needed for wheezing or shortness of breath. 09/27/16  Yes Clent Demark, PA-C  apixaban (ELIQUIS) 5 MG TABS tablet Take 1 tablet (5 mg total) by mouth 2 (two) times daily. 11/18/16  Yes Rice, Resa Miner, MD  hydrochlorothiazide (HYDRODIURIL) 25 MG tablet Take 1 tablet (25 mg total) by mouth daily. Take on tablet in the morning. Patient taking differently: Take 25 mg by mouth daily.  09/27/16  Yes Clent Demark, PA-C  HYDROmorphone (DILAUDID) 2 MG tablet Take 1 tablet (2 mg total) by mouth every 4 (four) hours as needed for severe pain. 01/03/17  Yes Lavonia Drafts, MD  insulin NPH-regular Human (NOVOLIN 70/30) (70-30) 100 UNIT/ML injection 35 units in the morning and 35 units in the evening. 01/02/17  Yes Clent Demark, PA-C  INSULIN SYRINGE .5CC/28G (INS SYRINGE/NEEDLE .5CC/28G) 28G X 1/2" 0.5 ML MISC Please  provide 1 month supply 01/02/17  Yes Clent Demark, PA-C  losartan (COZAAR) 50 MG tablet Take 1 tablet (50 mg total) by mouth daily. 09/27/16  Yes Clent Demark, PA-C  metoprolol tartrate (LOPRESSOR) 50 MG tablet Take 1 tablet (50 mg total) by mouth 2 (two) times daily. 09/27/16  Yes Clent Demark, PA-C  erythromycin ethylsuccinate (EES) 200 MG/5ML suspension Take 5 mLs (200 mg total) by mouth 3 (three) times daily. Take 15 minutes before each meal. Patient not taking: Reported on 01/09/2017 01/02/17 01/30/17  Clent Demark, PA-C  ferrous sulfate 325 (65 FE) MG tablet Take 1 tablet (325 mg total) by mouth 2 (two) times daily with a meal. Patient not taking: Reported on 01/03/2017 09/05/16   Clent Demark, PA-C  glucose blood (FREESTYLE TEST STRIPS) test strip Use as instructed Patient not taking: Reported on 01/09/2017 09/05/16   Clent Demark, PA-C  glucose monitoring kit (FREESTYLE) monitoring kit 1 each by Does not apply route 4 (four) times daily - after meals and at bedtime. 1 month Diabetic Testing Supplies for QAC-QHS accuchecks. Patient not taking: Reported on 01/09/2017 09/05/16   Clent Demark, PA-C  HYDROcodone-acetaminophen (NORCO) 10-325 MG tablet Take 1 tablet by mouth every 8 (eight) hours as needed. Patient not taking: Reported on 01/03/2017 01/02/17   Clent Demark, PA-C  Lancets (FREESTYLE) lancets Use as instructed Patient not taking: Reported on 01/09/2017 01/02/17   Clent Demark, PA-C  omeprazole (PRILOSEC) 40 MG capsule Take 1 capsule (40 mg total) by mouth daily. Patient not taking: Reported on 01/03/2017 09/27/16   Clent Demark, PA-C  ondansetron [facility] ODT) 8 MG disintegrating tablet Take 1 tablet (8 mg total) by mouth every 8 (eight) hours as needed for nausea or vomiting. Patient not taking: Reported on 01/03/2017 12/24/16   Lacretia Leigh, MD  zolpidem (AMBIEN) 5 MG tablet Take 1 tablet (5 mg total) by mouth at bedtime as needed for  sleep. Patient not taking: Reported on 01/09/2017 01/03/17   Lavonia Drafts, MD     Family History  Problem Relation Age of Onset  . Diabetes Father     Social History   Social History  . Marital status: Single    Spouse name: N/A  . Number of children: 2  . Years of education: 11   Occupational History  . other     unemployed   Social History Main Topics  . Smoking status: Former Smoker    Types: Cigarettes    Quit date: 04/24/1996  . Smokeless tobacco: Never Used     Comment: quit smoking cigarettes age 20  . Alcohol use No     Comment: 04/22/2014 "might have a few drinks a month"  . Drug use: No     Comment: 04/22/2104 "quit drugs ~ 1-2 yr ago"  . Sexual activity: Yes    Birth control/ protection: None   Other Topics Concern  . 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   Also works at [employer] having to lift elderly individuals.          ECOG Status: 1 - Symptomatic but completely ambulatory  Review of Systems: A 12 point ROS discussed and pertinent positives are indicated in the HPI above.  All other systems are negative.  Review of Systems  Vital Signs: BP (!) 145/99   Pulse (!) 108   Temp 99.7 F (37.6 C) (Oral)   Resp 15   Ht 5' 7"  (1.702 m)   Wt 209 lb (94.8 kg)   LMP 12/26/2016 (LMP Unknown)   SpO2 100%   BMI 32.73 kg/m   Physical Exam  Imaging:  Selected images from bilateral uterine artery embolization performed 12/07/2016 as well as subsequent abdominal CTs performed on 12/16/2016 and 12/24/2016 performed at subsequent emergency room visits for the workup of right lower quadrant abdominal pain were reviewed in detail.   Ct Abdomen Pelvis W Contrast  Result Date: 12/24/2016 CLINICAL DATA:  41 y/o F; abdominal pain, nausea, and acute vomiting. EXAM: CT ABDOMEN AND PELVIS WITH CONTRAST TECHNIQUE: Multidetector CT imaging of the abdomen and pelvis  was performed using the standard protocol following bolus administration of intravenous contrast. CONTRAST:  100 cc Isovue-300 COMPARISON:  12/16/2016 CT abdomen and pelvis. FINDINGS: Lower chest: No acute abnormality. Hepatobiliary: No focal liver abnormality is seen. No gallstones, gallbladder wall thickening, or biliary dilatation. Pancreas: Unremarkable. No pancreatic ductal dilatation or surrounding inflammatory changes. Spleen: Normal in size without focal abnormality. Adrenals/Urinary Tract: Adrenal glands are unremarkable. Kidneys are normal, without renal calculi, focal lesion, or  hydronephrosis. Bladder is unremarkable. Stomach/Bowel: Stomach is within normal limits. Appendix appears normal. No evidence of bowel wall thickening, distention, or inflammatory changes. Vascular/Lymphatic: No significant vascular findings are present. No enlarged abdominal or pelvic lymph nodes. Reproductive: Interval hypoattenuation of 6 cm exophytic uterine fibroma compatible with necrosis and posttreatment changes. No adnexal mass identified. Other: No abdominal wall hernia or abnormality. No abdominopelvic ascites. Musculoskeletal: No fracture is seen. The degenerative changes the spine predominantly at the L5-S1 level were disc and facet disease results in moderate bilateral foraminal stenosis and canal stenosis. IMPRESSION: 1. Interval central hypoattenuation of large exophytic uterine fibroid compatible with necrosis and posttreatment changes. 2. Otherwise unremarkable CT of the abdomen and pelvis. Electronically Signed   By: Kristine Garbe M.D.   On: 12/24/2016 04:43   Ct Angio Abd/pel W And/or Wo Contrast  Result Date: 12/16/2016 CLINICAL DATA:  Abdominal pain. Uterine artery embolization 1 week prior. EXAM: CTA ABDOMEN AND PELVIS wITHOUT AND WITH CONTRAST TECHNIQUE: Multidetector CT imaging of the abdomen and pelvis was performed using the standard protocol during bolus administration of intravenous  contrast. Multiplanar reconstructed images and MIPs were obtained and reviewed to evaluate the vascular anatomy. CONTRAST:  100 cc Isovue 370 IV COMPARISON:  Abdominal CT 11/29/2016, multiple priors FINDINGS: VASCULAR Suboptimal arterial bolus timing. Examination remains diagnostic for evaluation of dissection Aorta: Normal caliber aorta without aneurysm, dissection, vasculitis or significant stenosis. Celiac: Patent without evidence of aneurysm, dissection, vasculitis or significant stenosis. SMA: Patent without evidence of aneurysm, dissection, vasculitis or significant stenosis. Renals: Both renal arteries are patent without evidence of aneurysm, dissection, vasculitis, fibromuscular dysplasia or significant stenosis. IMA: Patent without evidence of aneurysm, dissection, vasculitis or significant stenosis. Inflow: Patent without evidence of aneurysm, dissection, vasculitis or significant stenosis. Proximal Outflow: Bilateral common femoral and visualized portions of the superficial and profunda femoral arteries are patent without evidence of aneurysm, dissection, vasculitis or significant stenosis. Veins: No obvious venous abnormality within the limitations of this arterial phase study. Review of the MIP images confirms the above findings. NON-VASCULAR Lower chest: The lung bases are clear. Hepatobiliary: No focal hepatic lesion. Gallbladder physiologically distended, no calcified stone. No biliary dilatation. Pancreas: No ductal dilatation or inflammation. Spleen: Normal in size without focal abnormality. Splenule inferiorly. Adrenals/Urinary Tract: No adrenal nodule. No hydronephrosis or perinephric edema. Urinary bladder is physiologically distended. Stomach/Bowel: Stomach is within normal limits. Appendix appears normal. No evidence of bowel wall thickening, distention, or inflammatory changes. Lymphatic: Small bilateral external iliac nodes, unchanged from prior exam. No new or progressive adenopathy.  Reproductive: There is central air within the uterine fibroid, a normal finding post recent uterine artery embolization. No adnexal mass. Other: No free air, free fluid, or intra-abdominal fluid collection. Musculoskeletal: There are no acute or suspicious osseous abnormalities. IMPRESSION: VASCULAR No acute vascular abnormality post recent uterine artery embolization. NON-VASCULAR Expected appearance of the uterine fibroid post embolization. No evident complication. No additional acute abnormality in the abdomen/pelvis. Electronically Signed   By: Jeb Levering M.D.   On: 12/16/2016 03:56    Labs:  CBC:  Recent Labs  12/24/16 0139 12/26/16 1246 12/31/16 2335 01/02/17 1419  WBC 11.9* 10.2 12.5* 10.4  HGB 11.0* 11.1* 11.1* 11.4  HCT 31.6* 32.5* 32.5* 35.1  PLT 560* 531* 413* 413*    COAGS:  Recent Labs  12/07/16 0517 12/08/16 0552 12/08/16 1539 12/09/16 0501 12/16/16 0207  INR 1.14  --   --   --  1.12  APTT 76* 62* 88* 83*  --  BMP:  Recent Labs  12/21/16 1403 12/24/16 0139 12/26/16 1246 12/31/16 2335  NA 136 133* 135 134*  K 4.1 3.8 3.9 3.6  CL 104 103 106 102  CO2 20* 23 20* 23  GLUCOSE 338* 285* 290* 243*  BUN 19 13 15 10   CALCIUM 9.8 9.5 9.7 9.5  CREATININE 1.14* 1.06* 1.03* 1.01*  GFRNONAA 58* >60 >60 >60  GFRAA >60 >60 >60 >60    LIVER FUNCTION TESTS:  Recent Labs  12/21/16 1403 12/24/16 0139 12/26/16 1246 12/31/16 2335  BILITOT 0.4 0.3 0.5 0.4  AST 22 16 17 15   ALT 10* 10* 9* 8*  ALKPHOS 50 51 47 46  PROT 8.0 7.9 8.3* 8.0  ALBUMIN 3.2* 3.3* 3.5 3.3*    TUMOR MARKERS: No results for input(s): AFPTM, CEA, CA199, CHROMGRNA in the last 8760 hours.  Assessment and Plan:  Shirley [name] is a 41 y.o. female with past medical history significant for CAD (post myocardial infarction) diabetes, CVA and hepatitis B who was found to have a DVT/pulmonary embolism in June and was subsequently placed on Eliquis.  Unfortunately, despite a  technically excellent result, confirmed with subsequent contrast enhanced abdominal CT scans, most recently on 12/24/2016, patient continues to report menorrhagia with worsening right lower abdominal and pelvic pain.   Unfortunately, the patient can increase to complain of right lower extremity pain and asymmetric edema, likely attributable to her right lower extremity DVT, and as such, I feel is unlikely the patient can come off of her anticoagulation at this time.  Ultimately it appears the patient has not benefited from the UFE and therefore may have to proceed with definitive hysterectomy.  As such, the patient was encouraged to keep her follow-up appointment tomorrow with her OB/GYN, Dr. Ihor Dow, at which time, the appropriateness of proceeding with definitive hysterectomy versus continued conservative management will be discussed/decided.  Patient demonstrated fair understanding of the above conversation.   She was encouraged to call the interventional radiology clinic with any future questions or concerns.  A copy of this report was sent to the requesting provider on this date.  Electronically Signed: Sandi Mariscal 01/09/2017, 5:17 PM   I spent a total of 15 Minutes in face to face in clinical consultation, greater than 50% of which was counseling/coordinating care for post UFE.

## 2017-01-11 ENCOUNTER — Encounter: Payer: Self-pay | Admitting: Obstetrics & Gynecology

## 2017-01-11 ENCOUNTER — Ambulatory Visit (INDEPENDENT_AMBULATORY_CARE_PROVIDER_SITE_OTHER): Payer: Self-pay | Admitting: Obstetrics & Gynecology

## 2017-01-11 VITALS — BP 157/97 | HR 96 | Ht 67.0 in | Wt 214.2 lb

## 2017-01-11 DIAGNOSIS — N939 Abnormal uterine and vaginal bleeding, unspecified: Secondary | ICD-10-CM

## 2017-01-11 MED ORDER — MEDROXYPROGESTERONE ACETATE 10 MG PO TABS: 10.0000 mg | ORAL_TABLET | Freq: Every day | ORAL | 2 refills | Status: DC

## 2017-01-11 MED ORDER — OXYCODONE-ACETAMINOPHEN 5-325 MG PO TABS: 1.0000 | ORAL_TABLET | Freq: Four times a day (QID) | ORAL | 0 refills | Status: DC | PRN

## 2017-01-11 NOTE — Progress Notes (Signed)
History:  41 y.o. G2P2000 here today for eval of pain and bleeding. Pt reports that the bleeding and the pain have continued. She reports that she was recently seen by Int Rad and they sent a reports that the procedure failed and rec hyst.  She cont to bleed despite the Megace.  She took the dilaudid and felt that it made her feel 'crazy' she stopped it after 2 doses.    The following portions of the patient's history were reviewed and updated as appropriate: allergies, current medications, past family history, past medical history, past social history, past surgical history and problem list.  Review of Systems:  Pertinent items are noted in HPI.   Objective:  Physical Exam Blood pressure (!) 157/97, pulse 96, height 5\' 7"  (1.702 m), weight 214 lb 3.2 oz (97.2 kg), last menstrual period 01/11/2017. CONSTITUTIONAL: Well-developed, well-nourished female in no acute distress.  HENT:  Normocephalic, atraumatic EYES: Conjunctivae and EOM are normal. No scleral icterus.  NECK: Normal range of motion SKIN: Skin is warm and dry. No rash noted. Not diaphoretic.No pallor. [place]: Alert and oriented to person, place, and time. Normal coordination.   Labs and Imaging Ct Abdomen Pelvis W Contrast  Result Date: 12/24/2016 CLINICAL DATA:  41 y/o F; abdominal pain, nausea, and acute vomiting. EXAM: CT ABDOMEN AND PELVIS WITH CONTRAST TECHNIQUE: Multidetector CT imaging of the abdomen and pelvis was performed using the standard protocol following bolus administration of intravenous contrast. CONTRAST:  100 cc Isovue-300 COMPARISON:  12/16/2016 CT abdomen and pelvis. FINDINGS: Lower chest: No acute abnormality. Hepatobiliary: No focal liver abnormality is seen. No gallstones, gallbladder wall thickening, or biliary dilatation. Pancreas: Unremarkable. No pancreatic ductal dilatation or surrounding inflammatory changes. Spleen: Normal in size without focal abnormality. Adrenals/Urinary Tract: Adrenal glands are  unremarkable. Kidneys are normal, without renal calculi, focal lesion, or hydronephrosis. Bladder is unremarkable. Stomach/Bowel: Stomach is within normal limits. Appendix appears normal. No evidence of bowel wall thickening, distention, or inflammatory changes. Vascular/Lymphatic: No significant vascular findings are present. No enlarged abdominal or pelvic lymph nodes. Reproductive: Interval hypoattenuation of 6 cm exophytic uterine fibroma compatible with necrosis and posttreatment changes. No adnexal mass identified. Other: No abdominal wall hernia or abnormality. No abdominopelvic ascites. Musculoskeletal: No fracture is seen. The degenerative changes the spine predominantly at the L5-S1 level were disc and facet disease results in moderate bilateral foraminal stenosis and canal stenosis. IMPRESSION: 1. Interval central hypoattenuation of large exophytic uterine fibroid compatible with necrosis and posttreatment changes. 2. Otherwise unremarkable CT of the abdomen and pelvis. Electronically Signed   By: Kristine Garbe M.D.   On: 12/24/2016 04:43   Ct Angio Abd/pel W And/or Wo Contrast  Result Date: 12/16/2016 CLINICAL DATA:  Abdominal pain. Uterine artery embolization 1 week prior. EXAM: CTA ABDOMEN AND PELVIS wITHOUT AND WITH CONTRAST TECHNIQUE: Multidetector CT imaging of the abdomen and pelvis was performed using the standard protocol during bolus administration of intravenous contrast. Multiplanar reconstructed images and MIPs were obtained and reviewed to evaluate the vascular anatomy. CONTRAST:  100 cc Isovue 370 IV COMPARISON:  Abdominal CT 11/29/2016, multiple priors FINDINGS: VASCULAR Suboptimal arterial bolus timing. Examination remains diagnostic for evaluation of dissection Aorta: Normal caliber aorta without aneurysm, dissection, vasculitis or significant stenosis. Celiac: Patent without evidence of aneurysm, dissection, vasculitis or significant stenosis. SMA: Patent without evidence  of aneurysm, dissection, vasculitis or significant stenosis. Renals: Both renal arteries are patent without evidence of aneurysm, dissection, vasculitis, fibromuscular dysplasia or significant stenosis. IMA: Patent without evidence  of aneurysm, dissection, vasculitis or significant stenosis. Inflow: Patent without evidence of aneurysm, dissection, vasculitis or significant stenosis. Proximal Outflow: Bilateral common femoral and visualized portions of the superficial and profunda femoral arteries are patent without evidence of aneurysm, dissection, vasculitis or significant stenosis. Veins: No obvious venous abnormality within the limitations of this arterial phase study. Review of the MIP images confirms the above findings. NON-VASCULAR Lower chest: The lung bases are clear. Hepatobiliary: No focal hepatic lesion. Gallbladder physiologically distended, no calcified stone. No biliary dilatation. Pancreas: No ductal dilatation or inflammation. Spleen: Normal in size without focal abnormality. Splenule inferiorly. Adrenals/Urinary Tract: No adrenal nodule. No hydronephrosis or perinephric edema. Urinary bladder is physiologically distended. Stomach/Bowel: Stomach is within normal limits. Appendix appears normal. No evidence of bowel wall thickening, distention, or inflammatory changes. Lymphatic: Small bilateral external iliac nodes, unchanged from prior exam. No new or progressive adenopathy. Reproductive: There is central air within the uterine fibroid, a normal finding post recent uterine artery embolization. No adnexal mass. Other: No free air, free fluid, or intra-abdominal fluid collection. Musculoskeletal: There are no acute or suspicious osseous abnormalities. IMPRESSION: VASCULAR No acute vascular abnormality post recent uterine artery embolization. NON-VASCULAR Expected appearance of the uterine fibroid post embolization. No evident complication. No additional acute abnormality in the abdomen/pelvis.  Electronically Signed   By: Jeb Levering M.D.   On: 12/16/2016 03:56   CBC    Component Value Date/Time   WBC 10.4 01/02/2017 1419   WBC 12.5 (H) 12/31/2016 2335   RBC 4.36 01/02/2017 1419   RBC 4.28 12/31/2016 2335   HGB 11.4 01/02/2017 1419   HCT 35.1 01/02/2017 1419   PLT 413 (H) 01/02/2017 1419   MCV 81 01/02/2017 1419   MCH 26.1 (L) 01/02/2017 1419   MCH 25.9 (L) 12/31/2016 2335   MCHC 32.5 01/02/2017 1419   MCHC 34.2 12/31/2016 2335   RDW 17.8 (H) 01/02/2017 1419   LYMPHSABS 2.5 01/02/2017 1419   MONOABS 0.9 12/31/2016 2335   EOSABS 0.7 (H) 01/02/2017 1419   BASOSABS 0.0 01/02/2017 1419    Assessment & Plan:  AUB- not responsive to [place]    Pt has failed the [place] but, she is NOT a candidate for surgery at this point.   Rec stop Megace Begin Provera 10mg  q day F/u in 6 weeks or sooner prn Pt was instructed by primary care to go to the ED for a doppler of her left leg  Stop Dilaudid Percocet 5mg  1 po q 6 hours prn apin #30 given   Kemora Pinard L. Harraway-Smith, M.D., Cherlynn June

## 2017-01-12 ENCOUNTER — Emergency Department (HOSPITAL_COMMUNITY)
Admission: EM | Admit: 2017-01-12 | Discharge: 2017-01-12 | Disposition: A | Discharge status: Home / Self Care | Payer: Medicaid Other | Attending: Emergency Medicine | Admitting: Emergency Medicine

## 2017-01-12 ENCOUNTER — Emergency Department (HOSPITAL_BASED_OUTPATIENT_CLINIC_OR_DEPARTMENT_OTHER)
Admit: 2017-01-12 | Discharge: 2017-01-12 | Disposition: A | Discharge status: Home / Self Care | Payer: Self-pay | Attending: Emergency Medicine | Admitting: Emergency Medicine

## 2017-01-12 DIAGNOSIS — I10 Essential (primary) hypertension: Secondary | ICD-10-CM | POA: Insufficient documentation

## 2017-01-12 DIAGNOSIS — E785 Hyperlipidemia, unspecified: Secondary | ICD-10-CM | POA: Insufficient documentation

## 2017-01-12 DIAGNOSIS — E119 Type 2 diabetes mellitus without complications: Secondary | ICD-10-CM | POA: Insufficient documentation

## 2017-01-12 DIAGNOSIS — Z8673 Personal history of transient ischemic attack (TIA), and cerebral infarction without residual deficits: Secondary | ICD-10-CM | POA: Insufficient documentation

## 2017-01-12 DIAGNOSIS — I252 Old myocardial infarction: Secondary | ICD-10-CM | POA: Insufficient documentation

## 2017-01-12 DIAGNOSIS — Z79899 Other long term (current) drug therapy: Secondary | ICD-10-CM | POA: Insufficient documentation

## 2017-01-12 DIAGNOSIS — M7989 Other specified soft tissue disorders: Secondary | ICD-10-CM

## 2017-01-12 DIAGNOSIS — Z87891 Personal history of nicotine dependence: Secondary | ICD-10-CM | POA: Insufficient documentation

## 2017-01-12 DIAGNOSIS — Z7901 Long term (current) use of anticoagulants: Secondary | ICD-10-CM | POA: Insufficient documentation

## 2017-01-12 DIAGNOSIS — R1115 Cyclical vomiting syndrome unrelated to migraine: Secondary | ICD-10-CM

## 2017-01-12 DIAGNOSIS — I2510 Atherosclerotic heart disease of native coronary artery without angina pectoris: Secondary | ICD-10-CM | POA: Insufficient documentation

## 2017-01-12 DIAGNOSIS — G43A0 Cyclical vomiting, not intractable: Secondary | ICD-10-CM | POA: Insufficient documentation

## 2017-01-12 LAB — BASIC METABOLIC PANEL
ANION GAP: 11 (ref 5–15)
BUN: 14 mg/dL (ref 6–20)
CO2: 20 mmol/L — ABNORMAL LOW (ref 22–32)
Calcium: 9.6 mg/dL (ref 8.9–10.3)
Chloride: 103 mmol/L (ref 101–111)
Creatinine, Ser: 1.15 mg/dL — ABNORMAL HIGH (ref 0.44–1.00)
GFR calc Af Amer: >60 mL/min (ref >60)
GFR, EST NON AFRICAN AMERICAN: 57 mL/min — AB (ref >60)
GLUCOSE: 386 mg/dL — AB (ref 65–99)
POTASSIUM: 3.6 mmol/L (ref 3.5–5.1)
SODIUM: 134 mmol/L — AB (ref 135–145)

## 2017-01-12 LAB — HEPATIC FUNCTION PANEL
ALBUMIN: 3.7 g/dL (ref 3.5–5.0)
ALT: 9 U/L — ABNORMAL LOW (ref 14–54)
AST: 24 U/L (ref 15–41)
Alkaline Phosphatase: 57 U/L (ref 38–126)
Bilirubin, Direct: <0.1 mg/dL — ABNORMAL LOW (ref 0.1–0.5)
TOTAL PROTEIN: 8.4 g/dL — AB (ref 6.5–8.1)
Total Bilirubin: 0.3 mg/dL (ref 0.3–1.2)

## 2017-01-12 LAB — LIPASE, BLOOD: LIPASE: 35 U/L (ref 11–51)

## 2017-01-12 LAB — CBC
HEMATOCRIT: 36.5 % (ref 36.0–46.0)
HEMOGLOBIN: 12.5 g/dL (ref 12.0–15.0)
MCH: 25.7 pg — ABNORMAL LOW (ref 26.0–34.0)
MCHC: 34.2 g/dL (ref 30.0–36.0)
MCV: 74.9 fL — ABNORMAL LOW (ref 78.0–100.0)
Platelets: 357 10*3/uL (ref 150–400)
RBC: 4.87 MIL/uL (ref 3.87–5.11)
RDW: 16 % — ABNORMAL HIGH (ref 11.5–15.5)
WBC: 13.7 10*3/uL — AB (ref 4.0–10.5)

## 2017-01-12 LAB — I-STAT TROPONIN, ED: Troponin i, poc: 0.01 ng/mL (ref 0.00–0.08)

## 2017-01-12 MED ORDER — CAPSAICIN 0.075 % EX CREA: TOPICAL_CREAM | Freq: Once | CUTANEOUS | Status: DC

## 2017-01-12 MED ORDER — SODIUM CHLORIDE 0.9 % IV BOLUS (SEPSIS)
500.0000 mL | Freq: Once | INTRAVENOUS | Status: AC
  Administered 2017-01-12: 500 mL via INTRAVENOUS

## 2017-01-12 MED ORDER — ONDANSETRON HCL 4 MG/2ML IJ SOLN
4.0000 mg | Freq: Once | INTRAMUSCULAR | Status: AC | PRN
  Administered 2017-01-12: 4 mg via INTRAVENOUS
  Filled 2017-01-12: qty 2

## 2017-01-12 MED ORDER — ONDANSETRON HCL 4 MG/2ML IJ SOLN
INTRAMUSCULAR | Status: AC
  Filled 2017-01-12: qty 2

## 2017-01-12 MED ORDER — CAPSICUM OLEORESIN 0.025 % EX CREA
TOPICAL_CREAM | Freq: Once | CUTANEOUS | Status: AC
  Administered 2017-01-12: 12:00:00 via TOPICAL
  Filled 2017-01-12: qty 60

## 2017-01-12 MED ORDER — LORAZEPAM 2 MG/ML IJ SOLN
1.0000 mg | Freq: Once | INTRAMUSCULAR | Status: AC
  Administered 2017-01-12: 1 mg via INTRAVENOUS
  Filled 2017-01-12: qty 1

## 2017-01-12 MED ORDER — PROMETHAZINE HCL 25 MG PO TABS: 25.0000 mg | ORAL_TABLET | Freq: Four times a day (QID) | ORAL | 0 refills | Status: DC | PRN

## 2017-01-12 MED ORDER — PROMETHAZINE HCL 25 MG/ML IJ SOLN
25.0000 mg | Freq: Once | INTRAMUSCULAR | Status: AC
  Administered 2017-01-12: 25 mg via INTRAMUSCULAR
  Filled 2017-01-12: qty 1

## 2017-01-12 MED ORDER — HYDROMORPHONE HCL 1 MG/ML IJ SOLN
1.0000 mg | Freq: Once | INTRAMUSCULAR | Status: DC
  Filled 2017-01-12: qty 1

## 2017-01-12 MED ORDER — PROMETHAZINE HCL 25 MG/ML IJ SOLN
25.0000 mg | Freq: Once | INTRAMUSCULAR | Status: AC
  Administered 2017-01-12: 25 mg via INTRAVENOUS
  Filled 2017-01-12: qty 1

## 2017-01-12 MED ORDER — HYDROMORPHONE HCL 1 MG/ML IJ SOLN
1.0000 mg | Freq: Once | INTRAMUSCULAR | Status: AC
  Administered 2017-01-12: 1 mg via INTRAMUSCULAR

## 2017-01-12 MED ORDER — HALOPERIDOL LACTATE 5 MG/ML IJ SOLN: 2.0000 mg | Freq: Once | INTRAMUSCULAR | Status: DC

## 2017-01-12 MED ORDER — HYDROMORPHONE HCL 1 MG/ML IJ SOLN
1.0000 mg | Freq: Once | INTRAMUSCULAR | Status: AC
  Administered 2017-01-12: 1 mg via INTRAVENOUS
  Filled 2017-01-12: qty 1

## 2017-01-12 NOTE — ED Notes (Signed)
Unable to obtain IV access, Yao, MD at bedside attempting US guided IV, pt continuously shaking, pt states, "it makes me feel better."

## 2017-01-12 NOTE — ED Provider Notes (Signed)
[facility] DEPT Provider Note   CSN: [id number] Arrival date & time: 01/12/17  0209     History   Chief Complaint Chief Complaint  Patient presents with  . Emesis  . Leg Swelling    HPI Shirley [name] is a 41 y.o. female presenting with sudden onset nausea, vomiting, and abdominal pain that began last night at 11 pm. She reports feeling clammy and hot before throwing up. She states the vomitus looks like there was blood in it. She ate [employer] for dinner at 6 pm without issue. No one else sick in her house. Denies fevers, chills, constipation, diarrhea, dysuria. States this happens often and phenergan and pain medication help. She denies alcohol use, tobacco use, and drug use.   HPI  Past Medical History:  Diagnosis Date  . Abscess of tunica vaginalis    10/09- Abundant S. aureus- sensitive to all abx  . Anxiety   . Blood dyscrasia   . CAD (coronary artery disease) 06/15/2006   s/p Subendocardial MI with PDA angioplasty(no stent) on 06/15/06 and relook  cath 06/19/06 showed patency of site. Cath 12/10- no restenosis or significant CAD progression  . CVA (cerebral vascular accident) ([place]) ~ 02/2014   denies residual on 04/22/2014  . CVA (cerebral vascular accident) [facility])    history of remote right cerebellar infarct noted on head CT at least since 10/2011  . Depression   . Diabetes mellitus type 2, uncontrolled, with complications ([place])   . Fibromyalgia   . Gastritis   . Gastroparesis    secondary to poorly controlled DM, last emptying study performed 01/2010  was normal but may be falsely positive as pt was on reglan  . GERD (gastroesophageal reflux disease)   . Hepatitis B, chronic (HCC)    Hep BeAb+,Hep B cAb+ & Hep BsAg+ (9/06)  . History of pyelonephritis    H/o GrpB Pyelonephritis (9/06) and UTI- 07/11- E.Coli, 12/10- GBS  . Hyperlipidemia   . Hypertension   . Iron deficiency anemia   . Irregular menses    Small ovarian follicles seen on GL(8/75)  . MI  (myocardial infarction) ([place]) 05/2006   PDA percutaneous transluminal coronary angioplasty  . Migraine    "weekly" (04/22/2014)  . N&V (nausea and vomiting)    Chronic. Unclear etiology with multiple admission and ED visits. CT abdomen with and without contrast (02/2011)  showed no acute process. Gastic Emptying scan (01/2010) was normal. Ultrasound of the abdomen was within normal limits. Hepatitis B viral load was undectable. HIV NR. EGD - gastritis, Hpylori + s/p Rx  . Obesity   . OSA (obstructive sleep apnea)    "suppose to wear mask but I don't" (04/22/2014)  . Peripheral neuropathy   . Pneumonia    "this is probably the 2nd or 3rd time I've had pneumonia" (04/22/2014)  . Recurrent boils   . Seasonal asthma   . Substance abuse   . Thrombocytosis ([place])    Hem/Onc suggested 2/2 chronic hepatits and/or iron deficiency anemia    Patient Active Problem List   Diagnosis Date Noted  . Chronic maxillary sinusitis 01/02/2017  . Acute blood loss anemia 11/13/2016  . Menorrhagia 11/13/2016  . Bilateral pulmonary embolism ([place]) 10/30/2016  . Acute DVT (deep venous thrombosis) ([place]) 10/30/2016  . Type 2 diabetes mellitus with hyperglycemia ([place]) 04/07/2016  . Diabetic gastroparesis ([place]) 04/06/2016  . Dehydration 03/27/2015  . Gastroparesis 11/11/2014  . Hematemesis 10/13/2014  . DKA (diabetic ketoacidoses) ([place]) 10/13/2014  . Tachycardia 07/21/2014  .  Abdominal pain, chronic, left lower quadrant   . Diabetic gastroparesis associated with type 2 diabetes mellitus ([place]) 04/28/2014  . History of Helicobacter pylori infection 04/22/2014  . Vaginal discharge 02/18/2014  . Atypical chest pain 07/22/2013  . Unspecified constipation 07/21/2013  . Tinea corporis 07/21/2013  . Intractable vomiting 04/29/2013  . Dysmenorrhea 04/22/2013  . UTI (urinary tract infection) 07/15/2012  . Headache(784.0) 02/08/2012  . Health care maintenance 01/22/2012  . Chronic hepatitis B ([place]) 03/07/2011  .  History of leukocytosis 04/06/2010  . THROMBOCYTOSIS 04/06/2010  . Polysubstance abuse 02/23/2010  . Iron deficiency anemia 11/22/2009  . PERIPHERAL NEUROPATHY 10/01/2009  . Hyperlipidemia 08/30/2009  . Diabetic polyneuropathy ([place]) 08/30/2009  . Hidradenitis (recurrent boils) 07/07/2008  . Depression 12/27/2007  . Abdominal pain, left lower quadrant 11/21/2007  . FIBROMYALGIA 10/30/2007  . BACK PAIN 04/01/2007  . OBSTRUCTIVE SLEEP APNEA 01/17/2007  . ANXIETY DEPRESSION 06/27/2006  . Chronic ischemic heart disease 06/15/2006  . OBESITY, MORBID 05/15/2006  . MIGRAINE HEADACHE 05/15/2006  . Asthma 05/15/2006  . Essential hypertension 01/16/2006  . IRREGULAR MENSTRUATION 01/16/2006  . PEDAL EDEMA 01/16/2006  . Poorly controlled type 2 diabetes mellitus with peripheral neuropathy ([place]) 01/16/1989    Past Surgical History:  Procedure Laterality Date  . CESAREAN SECTION  1997  . CORONARY ANGIOPLASTY WITH STENT PLACEMENT  2008   "2 stents"  . ESOPHAGOGASTRODUODENOSCOPY N/A 04/23/2014   Procedure: ESOPHAGOGASTRODUODENOSCOPY (EGD);  Surgeon: Winfield Cunas., MD;  Location[facility] ENDOSCOPY;  Service: Endoscopy;  Laterality: N/A;  . IR ANGIOGRAM PELVIS SELECTIVE OR SUPRASELECTIVE  12/07/2016  . IR ANGIOGRAM PELVIS SELECTIVE OR SUPRASELECTIVE  12/07/2016  . IR ANGIOGRAM SELECTIVE EACH ADDITIONAL VESSEL  12/07/2016  . IR ANGIOGRAM SELECTIVE EACH ADDITIONAL VESSEL  12/07/2016  . IR EMBO ARTERIAL NOT HEMORR HEMANG INC GUIDE ROADMAPPING  12/07/2016  . IR US GUIDE VASC ACCESS RIGHT  12/07/2016    OB History    Gravida Para Term Preterm AB Living   2 2 2          SAB TAB Ectopic Multiple Live Births           2       Home Medications    Prior to Admission medications   Medication Sig Start Date End Date Taking? Authorizing Provider  albuterol (PROVENTIL HFA;VENTOLIN HFA) 108 (90 Base) MCG/ACT inhaler Inhale 2 puffs into the lungs every 4 (four) hours as needed for wheezing or shortness of  breath. 09/27/16   Clent Demark, PA-C  apixaban (ELIQUIS) 5 MG TABS tablet Take 1 tablet (5 mg total) by mouth 2 (two) times daily. 11/18/16   Rice, Resa Miner, MD  erythromycin ethylsuccinate (EES) 200 MG/5ML suspension Take 5 mLs (200 mg total) by mouth 3 (three) times daily. Take 15 minutes before each meal. Patient not taking: Reported on 01/09/2017 01/02/17 01/30/17  Clent Demark, PA-C  ferrous sulfate 325 (65 FE) MG tablet Take 1 tablet (325 mg total) by mouth 2 (two) times daily with a meal. Patient not taking: Reported on 01/03/2017 09/05/16   Clent Demark, PA-C  glucose blood (FREESTYLE TEST STRIPS) test strip Use as instructed 09/05/16   Clent Demark, PA-C  glucose monitoring kit (FREESTYLE) monitoring kit 1 each by Does not apply route 4 (four) times daily - after meals and at bedtime. 1 month Diabetic Testing Supplies for QAC-QHS accuchecks. 09/05/16   Clent Demark, PA-C  hydrochlorothiazide (HYDRODIURIL) 25 MG tablet Take 1 tablet (25 mg total) by  mouth daily. Take on tablet in the morning. Patient taking differently: Take 25 mg by mouth daily.  09/27/16   Clent Demark, PA-C  HYDROcodone-acetaminophen [facility]) 10-325 MG tablet Take 1 tablet by mouth every 8 (eight) hours as needed. Patient not taking: Reported on 01/03/2017 01/02/17   Clent Demark, PA-C  insulin NPH-regular Human (NOVOLIN 70/30) (70-30) 100 UNIT/ML injection 35 units in the morning and 35 units in the evening. 01/02/17   Clent Demark, PA-C  INSULIN SYRINGE .5CC/28G (INS SYRINGE/NEEDLE .5CC/28G) 28G X 1/2" 0.5 ML MISC Please provide 1 month supply 01/02/17   Clent Demark, PA-C  Lancets (FREESTYLE) lancets Use as instructed 01/02/17   Clent Demark, PA-C  losartan (COZAAR) 50 MG tablet Take 1 tablet (50 mg total) by mouth daily. 09/27/16   Clent Demark, PA-C  medroxyPROGESTERone (PROVERA) 10 MG tablet Take 1 tablet (10 mg total) by mouth daily. 01/11/17   Lavonia Drafts, MD  metoprolol tartrate (LOPRESSOR) 50 MG tablet Take 1 tablet (50 mg total) by mouth 2 (two) times daily. 09/27/16   Clent Demark, PA-C  omeprazole (PRILOSEC) 40 MG capsule Take 1 capsule (40 mg total) by mouth daily. Patient not taking: Reported on 01/03/2017 09/27/16   Clent Demark, PA-C  ondansetron [facility] ODT) 8 MG disintegrating tablet Take 1 tablet (8 mg total) by mouth every 8 (eight) hours as needed for nausea or vomiting. Patient not taking: Reported on 01/03/2017 12/24/16   Lacretia Leigh, MD  oxyCODONE-acetaminophen (PERCOCET/ROXICET) 5-325 MG tablet Take 1-2 tablets by mouth every 6 (six) hours as needed. 01/11/17   Lavonia Drafts, MD  promethazine (PHENERGAN) 25 MG tablet Take 1 tablet (25 mg total) by mouth every 6 (six) hours as needed for nausea or vomiting. 01/12/17   Steve Rattler, DO  zolpidem (AMBIEN) 5 MG tablet Take 1 tablet (5 mg total) by mouth at bedtime as needed for sleep. Patient not taking: Reported on 01/09/2017 01/03/17   Lavonia Drafts, MD    Family History Family History  Problem Relation Age of Onset  . Diabetes Father     Social History Social History  Substance Use Topics  . Smoking status: Former Smoker    Types: Cigarettes    Quit date: 04/24/1996  . Smokeless tobacco: Never Used     Comment: quit smoking cigarettes age 1  . Alcohol use No     Comment: 04/22/2014 "might have a few drinks a month"     Allergies   Lisinopril and Morphine and related   Review of Systems Review of Systems  Constitutional: Positive for chills and diaphoresis. Negative for activity change, appetite change and fever.  HENT: Negative for congestion, rhinorrhea, sinus pain and sore throat.   Eyes: Negative for discharge and visual disturbance.  Respiratory: Negative for cough and shortness of breath.   Cardiovascular: Negative for chest pain and palpitations.  Gastrointestinal: Positive for abdominal pain, nausea and vomiting.  Negative for constipation and diarrhea.  Genitourinary: Negative for difficulty urinating, dysuria and flank pain.  Musculoskeletal: Negative for myalgias.  Skin: Negative for rash.  Neurological: Negative for seizures, weakness and headaches.     Physical Exam Updated Vital Signs BP (!) 147/106   Pulse (!) 114   Temp 98.8 F (37.1 C) (Oral)   Resp (!) 24   LMP 01/11/2017 (LMP Unknown)   SpO2 98%   Physical Exam  Constitutional: She is oriented to person, place, and time. She appears well-developed and well-nourished. No  distress.  HENT:  Head: Normocephalic and atraumatic.  Mouth/Throat: Oropharynx is clear and moist. No oropharyngeal exudate.  Eyes: Pupils are equal, round, and reactive to light. EOM are normal.  Neck: Normal range of motion. Neck supple.  Cardiovascular: Normal rate, regular rhythm and normal heart sounds.   Pulmonary/Chest: Effort normal and breath sounds normal. No respiratory distress.  Abdominal: Soft. Bowel sounds are normal. She exhibits no distension. There is generalized tenderness. There is no guarding.  Musculoskeletal: Normal range of motion. She exhibits no edema.  Lymphadenopathy:    She has no cervical adenopathy.  Neurological: She is alert and oriented to person, place, and time. She exhibits normal muscle tone.  Skin: Skin is warm and dry. No rash noted.   ED Treatments / Results  Labs (all labs ordered are listed, but only abnormal results are displayed) Labs Reviewed  BASIC METABOLIC PANEL - Abnormal; Notable for the following:       Result Value   Sodium 134 (*)    CO2 20 (*)    Glucose, Bld 386 (*)    Creatinine, Ser 1.15 (*)    GFR calc non Af Amer 57 (*)    All other components within normal limits  CBC - Abnormal; Notable for the following:    WBC 13.7 (*)    MCV 74.9 (*)    MCH 25.7 (*)    RDW 16.0 (*)    All other components within normal limits  HEPATIC FUNCTION PANEL - Abnormal; Notable for the following:    Total  Protein 8.4 (*)    ALT 9 (*)    Bilirubin, Direct <0.1 (*)    All other components within normal limits  LIPASE, BLOOD  URINALYSIS, ROUTINE W REFLEX MICROSCOPIC  I-STAT TROPONIN, ED    EKG  EKG Interpretation  Date/Time:  Friday January 12 2017 02:19:46 EDT Ventricular Rate:  152 PR Interval:  112 QRS Duration: 66 QT Interval:  254 QTC Calculation: 403 R Axis:   60 Text Interpretation:  Sinus tachycardia Otherwise normal ECG tachycardia new since previous Confirmed by Wandra Arthurs [phone]) on 01/12/2017 9:07:46 AM       Radiology No results found.   LE US DOPPLER BILATERAL  **Preliminary report by tech**  Bilateral lower extremity venous duplex complete. There is evidence of subacute deep vein thrombosis involving the popliteal, and peroneal veins of the right lower extremity. There is no evidence of superficial vein thrombosis involving the right lower extremity. There is no evidence of deep or superficial vein thrombosis involving the left lower extremity. There is no evidence of a Baker's cyst bilaterally.  Procedures Procedures (including critical care time)  Medications Ordered in ED Medications  haloperidol lactate (HALDOL) injection 2 mg (not administered)  HYDROmorphone (DILAUDID) injection 1 mg (not administered)  ondansetron (ZOFRAN) injection 4 mg (4 mg Intravenous Given 01/12/17 0901)  promethazine (PHENERGAN) injection 25 mg (25 mg Intravenous Given 01/12/17 0325)  promethazine (PHENERGAN) injection 25 mg (25 mg Intravenous Given 01/12/17 1005)  sodium chloride 0.9 % bolus 500 mL (0 mLs Intravenous Stopped 01/12/17 1157)  HYDROmorphone (DILAUDID) injection 1 mg (1 mg Intravenous Given 01/12/17 1002)  LORazepam (ATIVAN) injection 1 mg (1 mg Intravenous Given 01/12/17 1059)  capsicum oleoresin (TRIXAICIN) 0.025 % cream ( Topical Given 01/12/17 1139)     Initial Impression / Assessment and Plan / ED Course  I have reviewed the triage vital signs and the  nursing notes.  Pertinent labs & imaging results that were available  during my care of the patient were reviewed by me and considered in my medical decision making (see chart for details).     41 year old female with frequent ED visits presenting with nausea and vomiting, shaking on initial exam stating it helps to relieve nausea and pain. Patient requested phenergan and pain medications. LE dopplers done due to hx of DVT and patient reports of leg swelling. US showed stable right lower leg DVT, no evidence of DVT on left. Patient on Eliquis. Improved with phenergan, dilaudid, ativan, and haldol. Stable for discharge home. Advised follow up with PCP in 3 days if not improved. Patient verbalized understanding and agreement with plan.    Final Clinical Impressions(s) / ED Diagnoses   Final diagnoses:  Non-intractable cyclical vomiting with nausea    New Prescriptions New Prescriptions   PROMETHAZINE (PHENERGAN) 25 MG TABLET    Take 1 tablet (25 mg total) by mouth every 6 (six) hours as needed for nausea or vomiting.     Steve Rattler, DO 01/12/17 1425    Drenda Freeze, MD 01/13/17 [phone]

## 2017-01-12 NOTE — Progress Notes (Signed)
**  Preliminary report by tech**  Bilateral lower extremity venous duplex complete. There is evidence of subacute deep vein thrombosis involving the popliteal, and peroneal veins of the right lower extremity. There is no evidence of superficial vein thrombosis involving the right lower extremity. There is no evidence of deep or superficial vein thrombosis involving the left lower extremity. There is no evidence of a Baker's cyst bilaterally. Results were given to Dr. Darl Householder.  01/12/17 11:27 AM Carlos Levering RVT

## 2017-01-12 NOTE — ED Notes (Signed)
Pharmacist informed of potential zofran infiltration, no special instructions noted at this time, pt has warm compress placed at site

## 2017-01-12 NOTE — ED Triage Notes (Signed)
Patient arrives with a few days of leg swelling and a couple hours of nausea/vomiting. States history of recurrence for both symptoms. Gastroparesis history.

## 2017-01-18 ENCOUNTER — Ambulatory Visit (INDEPENDENT_AMBULATORY_CARE_PROVIDER_SITE_OTHER): Payer: Medicaid Other | Admitting: Physician Assistant

## 2017-01-22 ENCOUNTER — Telehealth (INDEPENDENT_AMBULATORY_CARE_PROVIDER_SITE_OTHER): Payer: Self-pay | Admitting: Physician Assistant

## 2017-01-22 ENCOUNTER — Emergency Department (HOSPITAL_COMMUNITY)
Admission: EM | Admit: 2017-01-22 | Discharge: 2017-01-22 | Disposition: A | Discharge status: Home / Self Care | Payer: Medicaid Other | Attending: Emergency Medicine | Admitting: Emergency Medicine

## 2017-01-22 ENCOUNTER — Encounter (HOSPITAL_COMMUNITY): Payer: Self-pay | Admitting: Emergency Medicine

## 2017-01-22 DIAGNOSIS — Z794 Long term (current) use of insulin: Secondary | ICD-10-CM | POA: Insufficient documentation

## 2017-01-22 DIAGNOSIS — R1084 Generalized abdominal pain: Secondary | ICD-10-CM | POA: Insufficient documentation

## 2017-01-22 DIAGNOSIS — E1140 Type 2 diabetes mellitus with diabetic neuropathy, unspecified: Secondary | ICD-10-CM | POA: Insufficient documentation

## 2017-01-22 DIAGNOSIS — I2510 Atherosclerotic heart disease of native coronary artery without angina pectoris: Secondary | ICD-10-CM | POA: Insufficient documentation

## 2017-01-22 DIAGNOSIS — I252 Old myocardial infarction: Secondary | ICD-10-CM | POA: Insufficient documentation

## 2017-01-22 DIAGNOSIS — Z8673 Personal history of transient ischemic attack (TIA), and cerebral infarction without residual deficits: Secondary | ICD-10-CM | POA: Insufficient documentation

## 2017-01-22 DIAGNOSIS — Z87891 Personal history of nicotine dependence: Secondary | ICD-10-CM | POA: Insufficient documentation

## 2017-01-22 DIAGNOSIS — Z79899 Other long term (current) drug therapy: Secondary | ICD-10-CM | POA: Insufficient documentation

## 2017-01-22 DIAGNOSIS — E785 Hyperlipidemia, unspecified: Secondary | ICD-10-CM | POA: Insufficient documentation

## 2017-01-22 DIAGNOSIS — I10 Essential (primary) hypertension: Secondary | ICD-10-CM | POA: Insufficient documentation

## 2017-01-22 DIAGNOSIS — Z86718 Personal history of other venous thrombosis and embolism: Secondary | ICD-10-CM | POA: Insufficient documentation

## 2017-01-22 DIAGNOSIS — Z7901 Long term (current) use of anticoagulants: Secondary | ICD-10-CM | POA: Insufficient documentation

## 2017-01-22 LAB — URINALYSIS, ROUTINE W REFLEX MICROSCOPIC
BILIRUBIN URINE: NEGATIVE
KETONES UR: NEGATIVE mg/dL
Leukocytes, UA: NEGATIVE
Nitrite: NEGATIVE
PH: 5.5 (ref 5.0–8.0)
PROTEIN: NEGATIVE mg/dL
Specific Gravity, Urine: 1.01 (ref 1.005–1.030)

## 2017-01-22 LAB — URINALYSIS, MICROSCOPIC (REFLEX)

## 2017-01-22 LAB — COMPREHENSIVE METABOLIC PANEL
ALBUMIN: 3.3 g/dL — AB (ref 3.5–5.0)
ALT: 8 U/L — AB (ref 14–54)
AST: 15 U/L (ref 15–41)
Alkaline Phosphatase: 54 U/L (ref 38–126)
Anion gap: 7 (ref 5–15)
BILIRUBIN TOTAL: 0.5 mg/dL (ref 0.3–1.2)
BUN: 19 mg/dL (ref 6–20)
CHLORIDE: 101 mmol/L (ref 101–111)
CO2: 24 mmol/L (ref 22–32)
CREATININE: 1.22 mg/dL — AB (ref 0.44–1.00)
Calcium: 9.1 mg/dL (ref 8.9–10.3)
GFR calc Af Amer: >60 mL/min (ref >60)
GFR, EST NON AFRICAN AMERICAN: 53 mL/min — AB (ref >60)
GLUCOSE: 418 mg/dL — AB (ref 65–99)
POTASSIUM: 3.8 mmol/L (ref 3.5–5.1)
Sodium: 132 mmol/L — ABNORMAL LOW (ref 135–145)
TOTAL PROTEIN: 7 g/dL (ref 6.5–8.1)

## 2017-01-22 LAB — CBC
HEMATOCRIT: 33.7 % — AB (ref 36.0–46.0)
Hemoglobin: 11.5 g/dL — ABNORMAL LOW (ref 12.0–15.0)
MCH: 25.1 pg — ABNORMAL LOW (ref 26.0–34.0)
MCHC: 34.1 g/dL (ref 30.0–36.0)
MCV: 73.4 fL — AB (ref 78.0–100.0)
PLATELETS: 355 10*3/uL (ref 150–400)
RBC: 4.59 MIL/uL (ref 3.87–5.11)
RDW: 15.8 % — AB (ref 11.5–15.5)
WBC: 10.5 10*3/uL (ref 4.0–10.5)

## 2017-01-22 LAB — LIPASE, BLOOD: Lipase: 42 U/L (ref 11–51)

## 2017-01-22 LAB — POC URINE PREG, ED: Preg Test, Ur: NEGATIVE

## 2017-01-22 MED ORDER — LORAZEPAM 1 MG PO TABS: 1.0000 mg | ORAL_TABLET | Freq: Three times a day (TID) | ORAL | 0 refills | Status: DC | PRN

## 2017-01-22 MED ORDER — GABAPENTIN 100 MG PO CAPS
100.0000 mg | ORAL_CAPSULE | Freq: Once | ORAL | Status: AC
  Administered 2017-01-22: 100 mg via ORAL
  Filled 2017-01-22: qty 1

## 2017-01-22 MED ORDER — DICYCLOMINE HCL 20 MG PO TABS: 20.0000 mg | ORAL_TABLET | Freq: Two times a day (BID) | ORAL | 0 refills | Status: DC

## 2017-01-22 MED ORDER — CAPSAICIN 0.025 % EX CREA
TOPICAL_CREAM | Freq: Once | CUTANEOUS | Status: AC
  Administered 2017-01-22: 15:00:00 via TOPICAL
  Filled 2017-01-22: qty 60

## 2017-01-22 MED ORDER — HALOPERIDOL 2 MG PO TABS
2.0000 mg | ORAL_TABLET | Freq: Once | ORAL | Status: AC
  Administered 2017-01-22: 2 mg via ORAL
  Filled 2017-01-22: qty 1

## 2017-01-22 NOTE — Telephone Encounter (Signed)
[facility] states they never received an RX for erythromycin. Please re-order. Nat Christen, CMA

## 2017-01-22 NOTE — ED Notes (Signed)
Patient reported abdominal pain as 10/10 after RN awakened patient from sleep. No acute distress noted.

## 2017-01-22 NOTE — Telephone Encounter (Signed)
Patient called requesting  A refill  Of erythromycin ethylsuccinate (EES) 200 MG/5ML suspension . Patient use [facility] . Please, let her know when is ready to pick up

## 2017-01-22 NOTE — ED Provider Notes (Signed)
[facility] DEPT Provider Note   CSN: [id number] Arrival date & time: 01/22/17  2094     History   Chief Complaint Chief Complaint  Patient presents with  . Abdominal Pain    HPI Shirley [name] is a 41 y.o. female.  HPI  Patient with multiple medical issues including chronic pain syndrome presents with ongoing abdominal pain, nausea, vomiting. She states that she is always in pain, this has not changed. Patient is diffuse throughout the abdomen, severe, sore, sharp. Nausea, anorexia or waxing/waning. Early patient has no vomiting, no nausea. No recent fever, chest pain, dyspnea or other changes from baseline condition.   Past Medical History:  Diagnosis Date  . Abscess of tunica vaginalis    10/09- Abundant S. aureus- sensitive to all abx  . Anxiety   . Blood dyscrasia   . CAD (coronary artery disease) 06/15/2006   s/p Subendocardial MI with PDA angioplasty(no stent) on 06/15/06 and relook  cath 06/19/06 showed patency of site. Cath 12/10- no restenosis or significant CAD progression  . CVA (cerebral vascular accident) ([place]) ~ 02/2014   denies residual on 04/22/2014  . CVA (cerebral vascular accident) [facility])    history of remote right cerebellar infarct noted on head CT at least since 10/2011  . Depression   . Diabetes mellitus type 2, uncontrolled, with complications ([place])   . Fibromyalgia   . Gastritis   . Gastroparesis    secondary to poorly controlled DM, last emptying study performed 01/2010  was normal but may be falsely positive as pt was on reglan  . GERD (gastroesophageal reflux disease)   . Hepatitis B, chronic (HCC)    Hep BeAb+,Hep B cAb+ & Hep BsAg+ (9/06)  . History of pyelonephritis    H/o GrpB Pyelonephritis (9/06) and UTI- 07/11- E.Coli, 12/10- GBS  . Hyperlipidemia   . Hypertension   . Iron deficiency anemia   . Irregular menses    Small ovarian follicles seen on BS(9/62)  . MI (myocardial infarction) ([place]) 05/2006   PDA percutaneous  transluminal coronary angioplasty  . Migraine    "weekly" (04/22/2014)  . N&V (nausea and vomiting)    Chronic. Unclear etiology with multiple admission and ED visits. CT abdomen with and without contrast (02/2011)  showed no acute process. Gastic Emptying scan (01/2010) was normal. Ultrasound of the abdomen was within normal limits. Hepatitis B viral load was undectable. HIV NR. EGD - gastritis, Hpylori + s/p Rx  . Obesity   . OSA (obstructive sleep apnea)    "suppose to wear mask but I don't" (04/22/2014)  . Peripheral neuropathy   . Pneumonia    "this is probably the 2nd or 3rd time I've had pneumonia" (04/22/2014)  . Recurrent boils   . Seasonal asthma   . Substance abuse ([place])   . Thrombocytosis ([place])    Hem/Onc suggested 2/2 chronic hepatits and/or iron deficiency anemia    Patient Active Problem List   Diagnosis Date Noted  . Chronic maxillary sinusitis 01/02/2017  . Acute blood loss anemia 11/13/2016  . Menorrhagia 11/13/2016  . Bilateral pulmonary embolism ([place]) 10/30/2016  . Acute DVT (deep venous thrombosis) ([place]) 10/30/2016  . Type 2 diabetes mellitus with hyperglycemia ([place]) 04/07/2016  . Diabetic gastroparesis ([place]) 04/06/2016  . Dehydration 03/27/2015  . Gastroparesis 11/11/2014  . Hematemesis 10/13/2014  . DKA (diabetic ketoacidoses) ([place]) 10/13/2014  . Tachycardia 07/21/2014  . Abdominal pain, chronic, left lower quadrant   . Diabetic gastroparesis associated with type 2 diabetes mellitus ([place])  04/28/2014  . History of Helicobacter pylori infection 04/22/2014  . Vaginal discharge 02/18/2014  . Atypical chest pain 07/22/2013  . Unspecified constipation 07/21/2013  . Tinea corporis 07/21/2013  . Intractable vomiting 04/29/2013  . Dysmenorrhea 04/22/2013  . UTI (urinary tract infection) 07/15/2012  . Headache(784.0) 02/08/2012  . Health care maintenance 01/22/2012  . Chronic hepatitis B ([place]) 03/07/2011  . History of leukocytosis 04/06/2010  .  THROMBOCYTOSIS 04/06/2010  . Polysubstance abuse ([place]) 02/23/2010  . Iron deficiency anemia 11/22/2009  . PERIPHERAL NEUROPATHY 10/01/2009  . Hyperlipidemia 08/30/2009  . Diabetic polyneuropathy ([place]) 08/30/2009  . Hidradenitis (recurrent boils) 07/07/2008  . Depression 12/27/2007  . Abdominal pain, left lower quadrant 11/21/2007  . FIBROMYALGIA 10/30/2007  . BACK PAIN 04/01/2007  . OBSTRUCTIVE SLEEP APNEA 01/17/2007  . ANXIETY DEPRESSION 06/27/2006  . Chronic ischemic heart disease 06/15/2006  . OBESITY, MORBID 05/15/2006  . MIGRAINE HEADACHE 05/15/2006  . Asthma 05/15/2006  . Essential hypertension 01/16/2006  . IRREGULAR MENSTRUATION 01/16/2006  . PEDAL EDEMA 01/16/2006  . Poorly controlled type 2 diabetes mellitus with peripheral neuropathy ([place]) 01/16/1989    Past Surgical History:  Procedure Laterality Date  . CESAREAN SECTION  1997  . CORONARY ANGIOPLASTY WITH STENT PLACEMENT  2008   "2 stents"  . ESOPHAGOGASTRODUODENOSCOPY N/A 04/23/2014   Procedure: ESOPHAGOGASTRODUODENOSCOPY (EGD);  Surgeon: Winfield Cunas., MD;  Location: [facility] ENDOSCOPY;  Service: Endoscopy;  Laterality: N/A;  . IR ANGIOGRAM PELVIS SELECTIVE OR SUPRASELECTIVE  12/07/2016  . IR ANGIOGRAM PELVIS SELECTIVE OR SUPRASELECTIVE  12/07/2016  . IR ANGIOGRAM SELECTIVE EACH ADDITIONAL VESSEL  12/07/2016  . IR ANGIOGRAM SELECTIVE EACH ADDITIONAL VESSEL  12/07/2016  . IR EMBO ARTERIAL NOT HEMORR HEMANG INC GUIDE ROADMAPPING  12/07/2016  . IR US GUIDE VASC ACCESS RIGHT  12/07/2016    OB History    Gravida Para Term Preterm AB Living   2 2 2          SAB TAB Ectopic Multiple Live Births           2       Home Medications    Prior to Admission medications   Medication Sig Start Date End Date Taking? Authorizing Provider  albuterol (PROVENTIL HFA;VENTOLIN HFA) 108 (90 Base) MCG/ACT inhaler Inhale 2 puffs into the lungs every 4 (four) hours as needed for wheezing or shortness of breath. 09/27/16   Clent Demark, PA-C  apixaban (ELIQUIS) 5 MG TABS tablet Take 1 tablet (5 mg total) by mouth 2 (two) times daily. 11/18/16   Rice, Resa Miner, MD  erythromycin ethylsuccinate (EES) 200 MG/5ML suspension Take 5 mLs (200 mg total) by mouth 3 (three) times daily. Take 15 minutes before each meal. Patient not taking: Reported on 01/09/2017 01/02/17 01/30/17  Clent Demark, PA-C  ferrous sulfate 325 (65 FE) MG tablet Take 1 tablet (325 mg total) by mouth 2 (two) times daily with a meal. Patient not taking: Reported on 01/03/2017 09/05/16   Clent Demark, PA-C  glucose blood (FREESTYLE TEST STRIPS) test strip Use as instructed 09/05/16   Clent Demark, PA-C  glucose monitoring kit (FREESTYLE) monitoring kit 1 each by Does not apply route 4 (four) times daily - after meals and at bedtime. 1 month Diabetic Testing Supplies for QAC-QHS accuchecks. 09/05/16   Clent Demark, PA-C  hydrochlorothiazide (HYDRODIURIL) 25 MG tablet Take 1 tablet (25 mg total) by mouth daily. Take on tablet in the morning. Patient taking differently: Take 25 mg by mouth daily.  09/27/16   Clent Demark, PA-C  HYDROcodone-acetaminophen [facility]) 10-325 MG tablet Take 1 tablet by mouth every 8 (eight) hours as needed. Patient not taking: Reported on 01/03/2017 01/02/17   Clent Demark, PA-C  insulin NPH-regular Human (NOVOLIN 70/30) (70-30) 100 UNIT/ML injection 35 units in the morning and 35 units in the evening. 01/02/17   Clent Demark, PA-C  INSULIN SYRINGE .5CC/28G (INS SYRINGE/NEEDLE .5CC/28G) 28G X 1/2" 0.5 ML MISC Please provide 1 month supply 01/02/17   Clent Demark, PA-C  Lancets (FREESTYLE) lancets Use as instructed 01/02/17   Clent Demark, PA-C  losartan (COZAAR) 50 MG tablet Take 1 tablet (50 mg total) by mouth daily. 09/27/16   Clent Demark, PA-C  medroxyPROGESTERone (PROVERA) 10 MG tablet Take 1 tablet (10 mg total) by mouth daily. 01/11/17   Lavonia Drafts, MD  metoprolol tartrate  (LOPRESSOR) 50 MG tablet Take 1 tablet (50 mg total) by mouth 2 (two) times daily. 09/27/16   Clent Demark, PA-C  omeprazole (PRILOSEC) 40 MG capsule Take 1 capsule (40 mg total) by mouth daily. Patient not taking: Reported on 01/03/2017 09/27/16   Clent Demark, PA-C  ondansetron [facility] ODT) 8 MG disintegrating tablet Take 1 tablet (8 mg total) by mouth every 8 (eight) hours as needed for nausea or vomiting. Patient not taking: Reported on 01/03/2017 12/24/16   Lacretia Leigh, MD  oxyCODONE-acetaminophen (PERCOCET/ROXICET) 5-325 MG tablet Take 1-2 tablets by mouth every 6 (six) hours as needed. 01/11/17   Lavonia Drafts, MD  promethazine (PHENERGAN) 25 MG tablet Take 1 tablet (25 mg total) by mouth every 6 (six) hours as needed for nausea or vomiting. 01/12/17   Steve Rattler, DO  zolpidem (AMBIEN) 5 MG tablet Take 1 tablet (5 mg total) by mouth at bedtime as needed for sleep. Patient not taking: Reported on 01/09/2017 01/03/17   Lavonia Drafts, MD    Family History Family History  Problem Relation Age of Onset  . Diabetes Father     Social History Social History  Substance Use Topics  . Smoking status: Former Smoker    Types: Cigarettes    Quit date: 04/24/1996  . Smokeless tobacco: Never Used     Comment: quit smoking cigarettes age 62  . Alcohol use No     Comment: 04/22/2014 "might have a few drinks a month"     Allergies   Lisinopril and Morphine and related   Review of Systems Review of Systems  Constitutional:       Per HPI, otherwise negative  HENT:       Per HPI, otherwise negative  Respiratory:       Per HPI, otherwise negative  Cardiovascular:       Per HPI, otherwise negative  Gastrointestinal: Positive for nausea and vomiting.  Endocrine:       Negative aside from HPI  Genitourinary:       Neg aside from HPI   Musculoskeletal:       Per HPI, otherwise negative  Skin: Negative.   Neurological: Positive for weakness. Negative for  syncope.     Physical Exam Updated Vital Signs BP (!) 143/103 (BP Location: Right Arm)   Pulse (!) 108   Temp 98 F (36.7 C) (Oral)   Resp 18   Ht 5' 7"  (1.702 m)   Wt 94.8 kg (209 lb)   LMP 01/11/2017 (LMP Unknown)   SpO2 100%   BMI 32.73 kg/m   Physical Exam  Constitutional: She  is oriented to person, place, and time. She appears well-developed and well-nourished. No distress.  Young female resting, calm, no distress, speaking clearly, no visible discomfort  HENT:  Head: Normocephalic and atraumatic.  Eyes: Conjunctivae and EOM are normal.  Cardiovascular: Normal rate and regular rhythm.   Pulmonary/Chest: Effort normal and breath sounds normal. No stridor. No respiratory distress.  Abdominal: She exhibits no distension.  Musculoskeletal: She exhibits no edema.  Neurological: She is alert and oriented to person, place, and time. No cranial nerve deficit.  Skin: Skin is warm and dry.  Psychiatric: She has a normal mood and affect.  Nursing note and vitals reviewed.    ED Treatments / Results  Labs (all labs ordered are listed, but only abnormal results are displayed) Labs Reviewed  COMPREHENSIVE METABOLIC PANEL - Abnormal; Notable for the following:       Result Value   Sodium 132 (*)    Glucose, Bld 418 (*)    Creatinine, Ser 1.22 (*)    Albumin 3.3 (*)    ALT 8 (*)    GFR calc non Af Amer 53 (*)    All other components within normal limits  CBC - Abnormal; Notable for the following:    Hemoglobin 11.5 (*)    HCT 33.7 (*)    MCV 73.4 (*)    MCH 25.1 (*)    RDW 15.8 (*)    All other components within normal limits  URINALYSIS, ROUTINE W REFLEX MICROSCOPIC - Abnormal; Notable for the following:    APPearance HAZY (*)    Glucose, UA >=500 (*)    Hgb urine dipstick LARGE (*)    All other components within normal limits  URINALYSIS, MICROSCOPIC (REFLEX) - Abnormal; Notable for the following:    Bacteria, UA RARE (*)    Squamous Epithelial / LPF 6-30 (*)     All other components within normal limits  LIPASE, BLOOD  I-STAT BETA HCG BLOOD, ED ([facility], [facility], [facility] ONLY)  POC URINE PREG, ED    Procedures Procedures (including critical care time)  Medications Ordered in ED Medications  capsaicin (ZOSTRIX) 0.025 % cream (not administered)  gabapentin (NEURONTIN) capsule 100 mg (not administered)  haloperidol (HALDOL) tablet 2 mg (not administered)     Initial Impression / Assessment and Plan / ED Course  I have reviewed the triage vital signs and the nursing notes.  Pertinent labs & imaging results that were available during my care of the patient were reviewed by me and considered in my medical decision making (see chart for details).  Young female with 73 prior ED visits in the past 6 months, and previously diagnosed chronic abdominal pain presents with ongoing abdominal pain, nausea. Here she is awake, alert, afebrile, in no distress, resting comfortably, speaking clearly. Labs notable for hyperglycemia without a gap.  No ketones, no evidence for DKA. No evidence for acute abdominal process. Patient provided Haldol, gabapentin, topical capsaicin.  Patient states that she has tried gabapentin with minimal relief in the past. We went through a list of different medications that she can try for additional relief of her chronic abdominal pain. She has improved somewhat with capsaicin cream. Given this improvement, absence of evidence for acute abdominal processes, the patient was discharged with referral to pain management. She will try a course of Ativan and Bentyl for relief pending that follow-up. Final Clinical Impressions(s) / ED Diagnoses   Final diagnoses:  Generalized abdominal pain    New Prescriptions New Prescriptions   DICYCLOMINE (  BENTYL) 20 MG TABLET    Take 1 tablet (20 mg total) by mouth 2 (two) times daily.   LORAZEPAM (ATIVAN) 1 MG TABLET    Take 1 tablet (1 mg total) by mouth 3 (three) times daily as needed (muscle relaxant).      Carmin Muskrat, MD 01/22/17 1535

## 2017-01-22 NOTE — ED Notes (Signed)
Pt called to be taken back to room x3. Pt did not answer.

## 2017-01-22 NOTE — Discharge Instructions (Signed)
As discussed, your evaluation today has been largely reassuring.  But, it is important that you monitor your condition carefully, and do not hesitate to return to the ED if you develop new, or concerning changes in your condition. ? ?Otherwise, please follow-up with your physician for appropriate ongoing care. ? ?

## 2017-01-22 NOTE — ED Notes (Signed)
Patient sleeping soundly when RN entered room.

## 2017-01-22 NOTE — ED Notes (Signed)
Patient able to ambulate independently  

## 2017-01-22 NOTE — ED Triage Notes (Signed)
Pt to ER for evaluation of right lower quadrant abd pain onset 3-4 days ago with nausea. Denies vomiting and diarrhea, denies urinary symptoms.

## 2017-01-23 ENCOUNTER — Telehealth: Payer: Self-pay | Admitting: *Deleted

## 2017-01-23 ENCOUNTER — Other Ambulatory Visit: Payer: Self-pay | Admitting: Obstetrics & Gynecology

## 2017-01-23 DIAGNOSIS — N939 Abnormal uterine and vaginal bleeding, unspecified: Secondary | ICD-10-CM

## 2017-01-23 DIAGNOSIS — D219 Benign neoplasm of connective and other soft tissue, unspecified: Secondary | ICD-10-CM

## 2017-01-23 MED ORDER — MEDROXYPROGESTERONE ACETATE 10 MG PO TABS: 20.0000 mg | ORAL_TABLET | Freq: Every day | ORAL | 2 refills | Status: DC

## 2017-01-23 MED ORDER — OXYCODONE-ACETAMINOPHEN 5-325 MG PO TABS: 1.0000 | ORAL_TABLET | Freq: Four times a day (QID) | ORAL | 0 refills | Status: DC | PRN

## 2017-01-23 NOTE — Telephone Encounter (Signed)
Agree with nursing staff's documentation of this patient's phone encounter.  Verita Schneiders, MD

## 2017-01-23 NOTE — Addendum Note (Signed)
Addended by: Verita Schneiders A on: 01/23/2017 10:59 AM   Modules accepted: Orders

## 2017-01-23 NOTE — Telephone Encounter (Signed)
Patient left message on nurse voicemail on 01/22/17 at 1646.  States when she last saw Dr. Ihor Dow she asked her to call her in 1 week to tell her how she was doing.  States she is still having quite a bit of pain and has been to the Emergency Room a couple of times.  Requests a return call from Dr. Ihor Dow.  Dr. Ihor Dow is currently out of the country.  I called patient.  States she is having pain in lower abdomin and lower back on the right side.  States she is also having bleeding and passing clots.  States she plans to pick up her provera today.  States she hasn't taken anything for pain, not even tylenol or motrin.  States she has been to the emergency room several times and needs something for relief.  I explained to patient that Dr. Ihor Dow is out of the country and I will have to discuss with another provider.  Explained I would call her back as soon as I have done.so.  Spoke with Dr. Harolyn Rutherford.  See MD orders.  Called patient to come in to pick up prescription.  Explained someone else can pickup the prescription but will need to be over the age of 58 and have a picture ID.  I explained the prescription will need to be signed for.  Have given patient another appointment for 02/06/17 with Dr. Ihor Dow.  Patient states understanding.

## 2017-01-23 NOTE — Telephone Encounter (Signed)
Please call [facility] pharmacy and inquire as to why pt did not receive her medication. I does no good to resend without knowing the reason for not filling medication.

## 2017-01-24 ENCOUNTER — Other Ambulatory Visit (INDEPENDENT_AMBULATORY_CARE_PROVIDER_SITE_OTHER): Payer: Self-pay | Admitting: Physician Assistant

## 2017-01-24 ENCOUNTER — Encounter: Payer: Self-pay | Admitting: *Deleted

## 2017-01-24 DIAGNOSIS — K3184 Gastroparesis: Secondary | ICD-10-CM

## 2017-01-24 MED ORDER — ERYTHROMYCIN ETHYLSUCCINATE 200 MG/5ML PO SUSR: 200.0000 mg | Freq: Three times a day (TID) | ORAL | 0 refills | Status: DC

## 2017-01-24 NOTE — Telephone Encounter (Signed)
Thank you. Rx has been sent again.

## 2017-01-24 NOTE — Telephone Encounter (Signed)
Per Lurena Joiner Rx was never received by the pharmacy, please re-send. Nat Christen, CMA

## 2017-01-31 ENCOUNTER — Emergency Department (HOSPITAL_COMMUNITY)
Admission: EM | Admit: 2017-01-31 | Discharge: 2017-01-31 | Disposition: A | Discharge status: Home / Self Care | Payer: Medicaid Other | Attending: Emergency Medicine | Admitting: Emergency Medicine

## 2017-01-31 ENCOUNTER — Encounter (HOSPITAL_COMMUNITY): Payer: Self-pay | Admitting: Emergency Medicine

## 2017-01-31 DIAGNOSIS — R109 Unspecified abdominal pain: Secondary | ICD-10-CM | POA: Insufficient documentation

## 2017-01-31 DIAGNOSIS — I10 Essential (primary) hypertension: Secondary | ICD-10-CM | POA: Insufficient documentation

## 2017-01-31 DIAGNOSIS — Z87891 Personal history of nicotine dependence: Secondary | ICD-10-CM | POA: Insufficient documentation

## 2017-01-31 DIAGNOSIS — I2510 Atherosclerotic heart disease of native coronary artery without angina pectoris: Secondary | ICD-10-CM | POA: Insufficient documentation

## 2017-01-31 DIAGNOSIS — E1165 Type 2 diabetes mellitus with hyperglycemia: Secondary | ICD-10-CM | POA: Insufficient documentation

## 2017-01-31 LAB — COMPREHENSIVE METABOLIC PANEL
ALK PHOS: 54 U/L (ref 38–126)
ALT: 9 U/L — AB (ref 14–54)
ANION GAP: 11 (ref 5–15)
AST: 17 U/L (ref 15–41)
Albumin: 3.2 g/dL — ABNORMAL LOW (ref 3.5–5.0)
BUN: 10 mg/dL (ref 6–20)
CALCIUM: 9.3 mg/dL (ref 8.9–10.3)
CO2: 22 mmol/L (ref 22–32)
CREATININE: 0.99 mg/dL (ref 0.44–1.00)
Chloride: 104 mmol/L (ref 101–111)
Glucose, Bld: 269 mg/dL — ABNORMAL HIGH (ref 65–99)
Potassium: 3.9 mmol/L (ref 3.5–5.1)
Sodium: 137 mmol/L (ref 135–145)
Total Bilirubin: 0.6 mg/dL (ref 0.3–1.2)
Total Protein: 7.2 g/dL (ref 6.5–8.1)

## 2017-01-31 LAB — URINALYSIS, ROUTINE W REFLEX MICROSCOPIC
Bilirubin Urine: NEGATIVE
Glucose, UA: >=500 mg/dL — AB
KETONES UR: 20 mg/dL — AB
Leukocytes, UA: NEGATIVE
NITRITE: NEGATIVE
PH: 6 (ref 5.0–8.0)
PROTEIN: 100 mg/dL — AB
Specific Gravity, Urine: 1.02 (ref 1.005–1.030)

## 2017-01-31 LAB — CBC
HCT: 35.2 % — ABNORMAL LOW (ref 36.0–46.0)
HEMOGLOBIN: 12.1 g/dL (ref 12.0–15.0)
MCH: 24.9 pg — AB (ref 26.0–34.0)
MCHC: 34.4 g/dL (ref 30.0–36.0)
MCV: 72.6 fL — ABNORMAL LOW (ref 78.0–100.0)
PLATELETS: 343 10*3/uL (ref 150–400)
RBC: 4.85 MIL/uL (ref 3.87–5.11)
RDW: 15.5 % (ref 11.5–15.5)
WBC: 12.3 10*3/uL — ABNORMAL HIGH (ref 4.0–10.5)

## 2017-01-31 LAB — PREGNANCY, URINE: PREG TEST UR: NEGATIVE

## 2017-01-31 LAB — LIPASE, BLOOD: Lipase: 30 U/L (ref 11–51)

## 2017-01-31 MED ORDER — HALOPERIDOL LACTATE 5 MG/ML IJ SOLN
5.0000 mg | Freq: Once | INTRAMUSCULAR | Status: AC
  Administered 2017-01-31: 5 mg via INTRAVENOUS
  Filled 2017-01-31: qty 1

## 2017-01-31 MED ORDER — METOCLOPRAMIDE HCL 10 MG PO TABS: 10.0000 mg | ORAL_TABLET | Freq: Four times a day (QID) | ORAL | 0 refills | Status: DC | PRN

## 2017-01-31 MED ORDER — SODIUM CHLORIDE 0.9 % IV BOLUS (SEPSIS)
1000.0000 mL | Freq: Once | INTRAVENOUS | Status: AC
  Administered 2017-01-31: 1000 mL via INTRAVENOUS

## 2017-01-31 MED ORDER — OXYCODONE-ACETAMINOPHEN 5-325 MG PO TABS
2.0000 | ORAL_TABLET | Freq: Once | ORAL | Status: AC
  Administered 2017-01-31: 2 via ORAL
  Filled 2017-01-31: qty 2

## 2017-01-31 NOTE — ED Notes (Signed)
Pt sleeping at this time.

## 2017-01-31 NOTE — ED Notes (Signed)
This RN attempted IV access without success, IV team consulted

## 2017-01-31 NOTE — ED Notes (Signed)
Pt ambulatory to restroom

## 2017-01-31 NOTE — ED Provider Notes (Signed)
Emergency Department Provider Note   I have reviewed the triage vital signs and the nursing notes.   HISTORY  Chief Complaint Abdominal Pain   HPI Shirley [name] is a 41 y.o. female with a history of chronic abdominal pain and 21 emergency department visits the last 6 months also all relating to the same. She states that over the night last night she started having vomiting is nonbloody nonbilious. After the vomiting started having abdominal pain and seems to be worse on the right.No sick contacts. No fevers. No diarrhea.Does have a history of this happening before. She has a pain management doctor and her primary doctor. No rashes or trauma. No other associated or modifying symptoms.   Past Medical History:  Diagnosis Date  . Abscess of tunica vaginalis    10/09- Abundant S. aureus- sensitive to all abx  . Anxiety   . Blood dyscrasia   . CAD (coronary artery disease) 06/15/2006   s/p Subendocardial MI with PDA angioplasty(no stent) on 06/15/06 and relook  cath 06/19/06 showed patency of site. Cath 12/10- no restenosis or significant CAD progression  . CVA (cerebral vascular accident) ([place]) ~ 02/2014   denies residual on 04/22/2014  . CVA (cerebral vascular accident) [facility])    history of remote right cerebellar infarct noted on head CT at least since 10/2011  . Depression   . Diabetes mellitus type 2, uncontrolled, with complications ([place])   . Fibromyalgia   . Gastritis   . Gastroparesis    secondary to poorly controlled DM, last emptying study performed 01/2010  was normal but may be falsely positive as pt was on reglan  . GERD (gastroesophageal reflux disease)   . Hepatitis B, chronic (HCC)    Hep BeAb+,Hep B cAb+ & Hep BsAg+ (9/06)  . History of pyelonephritis    H/o GrpB Pyelonephritis (9/06) and UTI- 07/11- E.Coli, 12/10- GBS  . Hyperlipidemia   . Hypertension   . Iron deficiency anemia   . Irregular menses    Small ovarian follicles seen on UX(3/23)  . MI  (myocardial infarction) ([place]) 05/2006   PDA percutaneous transluminal coronary angioplasty  . Migraine    "weekly" (04/22/2014)  . N&V (nausea and vomiting)    Chronic. Unclear etiology with multiple admission and ED visits. CT abdomen with and without contrast (02/2011)  showed no acute process. Gastic Emptying scan (01/2010) was normal. Ultrasound of the abdomen was within normal limits. Hepatitis B viral load was undectable. HIV NR. EGD - gastritis, Hpylori + s/p Rx  . Obesity   . OSA (obstructive sleep apnea)    "suppose to wear mask but I don't" (04/22/2014)  . Peripheral neuropathy   . Pneumonia    "this is probably the 2nd or 3rd time I've had pneumonia" (04/22/2014)  . Recurrent boils   . Seasonal asthma   . Substance abuse ([place])   . Thrombocytosis ([place])    Hem/Onc suggested 2/2 chronic hepatits and/or iron deficiency anemia    Patient Active Problem List   Diagnosis Date Noted  . Chronic maxillary sinusitis 01/02/2017  . Acute blood loss anemia 11/13/2016  . Menorrhagia 11/13/2016  . Bilateral pulmonary embolism ([place]) 10/30/2016  . Acute DVT (deep venous thrombosis) ([place]) 10/30/2016  . Type 2 diabetes mellitus with hyperglycemia ([place]) 04/07/2016  . Diabetic gastroparesis ([place]) 04/06/2016  . Dehydration 03/27/2015  . Gastroparesis 11/11/2014  . Hematemesis 10/13/2014  . DKA (diabetic ketoacidoses) ([place]) 10/13/2014  . Tachycardia 07/21/2014  . Abdominal pain, chronic, left lower  quadrant   . Diabetic gastroparesis associated with type 2 diabetes mellitus ([place]) 04/28/2014  . History of Helicobacter pylori infection 04/22/2014  . Vaginal discharge 02/18/2014  . Atypical chest pain 07/22/2013  . Unspecified constipation 07/21/2013  . Tinea corporis 07/21/2013  . Intractable vomiting 04/29/2013  . Dysmenorrhea 04/22/2013  . UTI (urinary tract infection) 07/15/2012  . Headache(784.0) 02/08/2012  . Health care maintenance 01/22/2012  . Chronic hepatitis B ([place])  03/07/2011  . History of leukocytosis 04/06/2010  . THROMBOCYTOSIS 04/06/2010  . Polysubstance abuse ([place]) 02/23/2010  . Iron deficiency anemia 11/22/2009  . PERIPHERAL NEUROPATHY 10/01/2009  . Hyperlipidemia 08/30/2009  . Diabetic polyneuropathy ([place]) 08/30/2009  . Hidradenitis (recurrent boils) 07/07/2008  . Depression 12/27/2007  . Abdominal pain, left lower quadrant 11/21/2007  . FIBROMYALGIA 10/30/2007  . BACK PAIN 04/01/2007  . OBSTRUCTIVE SLEEP APNEA 01/17/2007  . ANXIETY DEPRESSION 06/27/2006  . Chronic ischemic heart disease 06/15/2006  . OBESITY, MORBID 05/15/2006  . MIGRAINE HEADACHE 05/15/2006  . Asthma 05/15/2006  . Essential hypertension 01/16/2006  . IRREGULAR MENSTRUATION 01/16/2006  . PEDAL EDEMA 01/16/2006  . Poorly controlled type 2 diabetes mellitus with peripheral neuropathy ([place]) 01/16/1989    Past Surgical History:  Procedure Laterality Date  . CESAREAN SECTION  1997  . CORONARY ANGIOPLASTY WITH STENT PLACEMENT  2008   "2 stents"  . ESOPHAGOGASTRODUODENOSCOPY N/A 04/23/2014   Procedure: ESOPHAGOGASTRODUODENOSCOPY (EGD);  Surgeon: Winfield Cunas., MD;  [facility] ENDOSCOPY;  Service: Endoscopy;  Laterality: N/A;  . IR ANGIOGRAM PELVIS SELECTIVE OR SUPRASELECTIVE  12/07/2016  . IR ANGIOGRAM PELVIS SELECTIVE OR SUPRASELECTIVE  12/07/2016  . IR ANGIOGRAM SELECTIVE EACH ADDITIONAL VESSEL  12/07/2016  . IR ANGIOGRAM SELECTIVE EACH ADDITIONAL VESSEL  12/07/2016  . IR EMBO ARTERIAL NOT HEMORR HEMANG INC GUIDE ROADMAPPING  12/07/2016  . IR US GUIDE VASC ACCESS RIGHT  12/07/2016    Current Outpatient Rx  . Order #: [id number] Class: Normal  . Order #: [id number] Class: Print  . Order #: [id number] Class: Normal  . Order #: [id number] Class: Normal  . Order #: [id number] Class: Normal  . Order #: [id number] Class: Normal  . Order #: [id number] Class: Normal  . Order #: [id number] Class: Normal  . Order #: [id number] Class: Print  . Order #: [id number] Class: Normal  . Order  #: [id number] Class: Normal  . Order #: [id number] Class: Print  . Order #: [id number] Class: Normal  . Order #: [id number] Class: Normal  . Order #: [id number] Class: Print  . Order #: [id number] Class: Normal  . Order #: [id number] Class: Print  . Order #: [id number] Class: Normal  . Order #: [id number] Class: Print  . Order #: [id number] Class: Print  . Order #: [id number] Class: Print  . Order #: [id number] Class: Print    Allergies Lisinopril and Morphine and related  Family History  Problem Relation Age of Onset  . Diabetes Father     Social History Social History  Substance Use Topics  . Smoking status: Former Smoker    Types: Cigarettes    Quit date: 04/24/1996  . Smokeless tobacco: Never Used     Comment: quit smoking cigarettes age 66  . Alcohol use No     Comment: 04/22/2014 "might have a few drinks a month"    Review of Systems  All other systems negative except as documented in the HPI. All pertinent positives and negatives as reviewed in the HPI. ____________________________________________   PHYSICAL EXAM:  VITAL SIGNS: ED Triage Vitals  Enc Vitals Group     BP 01/31/17 1826 (!) 234/117     Pulse  Rate 01/31/17 1826 (!) 120     Resp 01/31/17 1826 (!) 22     Temp --      Temp src --      SpO2 01/31/17 1826 99 %     Weight 01/31/17 1828 209 lb (94.8 kg)     Height 01/31/17 1828 5\' 7"  (1.702 m)     Head Circumference --      Peak Flow --      Pain Score 01/31/17 1826 10     Pain Loc --      Pain Edu? --      Excl. in [place]? --     Constitutional: Alert and oriented. Well appearing and in no acute distress. Eyes: Conjunctivae are normal. PERRL. EOMI. Head: Atraumatic. Nose: No congestion/rhinnorhea. Mouth/Throat: Mucous membranes are moist.  Oropharynx non-erythematous. Neck: No stridor.  No meningeal signs.   Cardiovascular: Normal rate, regular rhythm. Good peripheral circulation. Grossly normal heart sounds.   Respiratory: Normal respiratory effort.  No  retractions. Lungs CTAB. Gastrointestinal: Soft and nontender. No distention.  Musculoskeletal: No lower extremity tenderness nor edema. No gross deformities of extremities. Neurologic:  Normal speech and language. No gross focal neurologic deficits are appreciated.  Skin:  Skin is warm, dry and intact. No rash noted.  ____________________________________________   LABS (all labs ordered are listed, but only abnormal results are displayed)  Labs Reviewed  COMPREHENSIVE METABOLIC PANEL - Abnormal; Notable for the following:       Result Value   Glucose, Bld 269 (*)    Albumin 3.2 (*)    ALT 9 (*)    All other components within normal limits  CBC - Abnormal; Notable for the following:    WBC 12.3 (*)    HCT 35.2 (*)    MCV 72.6 (*)    MCH 24.9 (*)    All other components within normal limits  URINALYSIS, ROUTINE W REFLEX MICROSCOPIC - Abnormal; Notable for the following:    APPearance HAZY (*)    Glucose, UA >=500 (*)    Hgb urine dipstick LARGE (*)    Ketones, ur 20 (*)    Protein, ur 100 (*)    Bacteria, UA RARE (*)    Squamous Epithelial / LPF 6-30 (*)    All other components within normal limits  LIPASE, BLOOD  PREGNANCY, URINE   ____________________________________________  PROCEDURES  Procedure(s) performed:   Procedures   ____________________________________________   INITIAL IMPRESSION / ASSESSMENT AND PLAN / ED COURSE  Pertinent labs & imaging results that were available during my care of the patient were reviewed by me and considered in my medical decision making (see chart for details).  Patient here with what seems to be an acute exacerbation of her chronic abdominal pain. Initially quite tachycardic however this got better with fluids and one dose of pain medicine and nausea medicine. Subsequently is able to tolerate by mouth fluids. Repeat abdominal exam is still benign. Low suspicion for intra-abdominal emergency. Considered doing a CT scan for her  still mildly elevated heart rate of 105 to ensure she does not have a PE however she has a long history of heart rates in the 95-110 range and on reevaluation she persistently says she does not have any shortness of breath. Once again her abdominal exam is still benign there with improvement in her symptoms I don't think any imaging of her abdomen is necessary either. Discharge on a prescription for Reglan with PCP follow up.   I reviewed  the patient's narcotic database history in [place] and found that they had received 15 prescriptions in the last 6 months from many providers with a risk score of 580.   After review of this I wonder if she was actually having mild withdrawal symptoms on arrival. Either way I don't believe another Rx for narcotics is indicated at this time.  ____________________________________________  FINAL CLINICAL IMPRESSION(S) / ED DIAGNOSES  Final diagnoses:  Abdominal pain, unspecified abdominal location    MEDICATIONS GIVEN DURING THIS VISIT:  Medications  sodium chloride 0.9 % bolus 1,000 mL (0 mLs Intravenous Stopped 01/31/17 2046)  haloperidol lactate (HALDOL) injection 5 mg (5 mg Intravenous Given 01/31/17 1911)  sodium chloride 0.9 % bolus 1,000 mL (0 mLs Intravenous Stopped 01/31/17 2233)  oxyCODONE-acetaminophen (PERCOCET/ROXICET) 5-325 MG per tablet 2 tablet (2 tablets Oral Given 01/31/17 2058)     NEW OUTPATIENT MEDICATIONS STARTED DURING THIS VISIT:  New Prescriptions   METOCLOPRAMIDE (REGLAN) 10 MG TABLET    Take 1 tablet (10 mg total) by mouth every 6 (six) hours as needed for nausea (nausea/headache).    Note:  This document was prepared using Dragon voice recognition software and may include unintentional dictation errors.   Merrily Pew, MD 01/31/17 [phone]

## 2017-01-31 NOTE — ED Triage Notes (Signed)
Pt BIB EMS for evaluation of heavy vaginal bleeding and abdominal pain following a uterine artery embolization in August 2018. Pt hx DVT and PE, currently on eliquis. EMS vitals: BP 133/74, HR-140. Pt shaking in the bed, per EMS, this is pt's coping mechanism for pain.

## 2017-01-31 NOTE — ED Notes (Signed)
Mother requesting to be called to inform about daughters wellbeing. [place]

## 2017-02-01 ENCOUNTER — Ambulatory Visit (INDEPENDENT_AMBULATORY_CARE_PROVIDER_SITE_OTHER): Payer: Medicaid Other | Admitting: Physician Assistant

## 2017-02-05 ENCOUNTER — Encounter: Payer: Self-pay | Admitting: Interventional Radiology

## 2017-02-06 ENCOUNTER — Encounter: Payer: Self-pay | Admitting: Obstetrics & Gynecology

## 2017-02-06 ENCOUNTER — Ambulatory Visit (INDEPENDENT_AMBULATORY_CARE_PROVIDER_SITE_OTHER): Payer: Self-pay | Admitting: Obstetrics & Gynecology

## 2017-02-06 VITALS — BP 157/96 | HR 107 | Wt 210.5 lb

## 2017-02-06 DIAGNOSIS — N939 Abnormal uterine and vaginal bleeding, unspecified: Secondary | ICD-10-CM

## 2017-02-06 DIAGNOSIS — M79605 Pain in left leg: Secondary | ICD-10-CM

## 2017-02-06 DIAGNOSIS — D219 Benign neoplasm of connective and other soft tissue, unspecified: Secondary | ICD-10-CM

## 2017-02-06 DIAGNOSIS — M79604 Pain in right leg: Secondary | ICD-10-CM

## 2017-02-06 NOTE — Progress Notes (Signed)
History:  41 y.o. G2P2000 here today for follow up of AUB. Pt reports increased bleeding with clots despite Provera which was started in Sept. She reports that the bleeding has not improved at all. She reports that her bleeding is unchanged from the [place]. Pt has been followed since July 2018. She was nitiially seen form a PE and DVTs and started having AUB thought to be exacerbated by the anticoagulation. She was not deemed a safe surgical candidate and she had a [place] in 11/2016. Pt remains on anticoagulation for DVT in right leg and PE. The etiology of the clot is unknown. Pt has failed all conservative measures for the AUB. She had an initial decrease in her bleedign pattern after the [place] but, it is now back and persistent and is assoc with clotting. Pt reprots larger clots with more bleeding since prior to the [place]. Pt reports daily bleeding since 01/27/2017.   The following portions of the patient's history were reviewed and updated as appropriate: allergies, current medications, past family history, past medical history, past social history, past surgical history and problem list.  Review of Systems:  Pertinent items are noted in HPI.   Objective:  Physical Exam Blood pressure (!) 157/96, pulse (!) 107, weight 210 lb 8 oz (95.5 kg), last menstrual period 01/11/2017. CONSTITUTIONAL: Well-developed, well-nourished female in no acute distress.  HENT:  Normocephalic, atraumatic EYES: Conjunctivae and EOM are normal. No scleral icterus.  NECK: Normal range of motion SKIN: Skin is warm and dry. No rash noted. Not diaphoretic.No pallor. [place]: Alert and oriented to person, place, and time. Normal coordination.  Abd: Soft, nontender and nondistended Pelvic: Normal appearing external genitalia; normal appearing vaginal mucosa and cervix.  Normal discharge. Enlarged uterus 16-18 weeks sized. The uterus is mobile. Consistent with a fibroid uterus. Blood is noted in the vault. No other palpable masses  noted.  Labs and Imaging Ir Radiologist Eval & Mgmt  Result Date: 02/05/2017 Please refer to notes tab for details about interventional procedure. (Op Note)   Assessment & Plan:  Large uterine fibroids- symptomatic AUB thought due to the above and exacerbated by anticoagulation due to thromboembolic disease.   Pt is seeing primary care tomorrow and I would like their assessment of the pts clinical status. She is requesting a total abdominal hysterectomy but, I will need her anticoagulation meds stopped for ~24 hours prior to the procedure. If they agree she can be transitioned to heparin and then her meds can be stopped and restarted following the procedure. This is ONLY if the pt is considered clinically stabel enough for anesthesia as well.  She may require a pulm consult to determine however, I will let her primary care provider determine what is in her best interest.   In the meantime, I will stop her Provera and see if her sx improve at all off Progestins. Pt is NOT a candidate for EES due to her h/o throembolic ds.  Of note, pt is requesting pain meds for pain in her legs due to the clot. This was recently refilled by Dr. Harolyn Rutherford. I declined her request today as this is not a GYN issue.    Total face-to-face time with patient was 20 min.  Greater than 50% was spent in counseling and coordination of care with the patient.    Jaide Hillenburg L. Harraway-Smith, M.D., FACOG      Will send o

## 2017-02-06 NOTE — Progress Notes (Signed)
Pt stated having heavy bleeding/pain since June 5 and have history Fibroids . Pt went to ED 01/31/2017 and prescribe Rx. Percocet it help some.

## 2017-02-07 ENCOUNTER — Encounter: Payer: Self-pay | Admitting: Obstetrics & Gynecology

## 2017-02-07 ENCOUNTER — Ambulatory Visit (INDEPENDENT_AMBULATORY_CARE_PROVIDER_SITE_OTHER): Payer: Self-pay | Admitting: Physician Assistant

## 2017-02-07 ENCOUNTER — Encounter (INDEPENDENT_AMBULATORY_CARE_PROVIDER_SITE_OTHER): Payer: Self-pay | Admitting: Physician Assistant

## 2017-02-07 VITALS — BP 155/103 | HR 97 | Temp 97.7°F | Wt 211.8 lb

## 2017-02-07 DIAGNOSIS — E1169 Type 2 diabetes mellitus with other specified complication: Secondary | ICD-10-CM

## 2017-02-07 DIAGNOSIS — R112 Nausea with vomiting, unspecified: Secondary | ICD-10-CM

## 2017-02-07 DIAGNOSIS — M62838 Other muscle spasm: Secondary | ICD-10-CM

## 2017-02-07 DIAGNOSIS — K3184 Gastroparesis: Secondary | ICD-10-CM

## 2017-02-07 DIAGNOSIS — N921 Excessive and frequent menstruation with irregular cycle: Secondary | ICD-10-CM

## 2017-02-07 DIAGNOSIS — I10 Essential (primary) hypertension: Secondary | ICD-10-CM

## 2017-02-07 DIAGNOSIS — Z794 Long term (current) use of insulin: Secondary | ICD-10-CM

## 2017-02-07 LAB — POCT URINALYSIS DIPSTICK
Bilirubin, UA: NEGATIVE
Glucose, UA: 1000
KETONES UA: NEGATIVE
Leukocytes, UA: NEGATIVE
Nitrite, UA: NEGATIVE
PH UA: 5.5 (ref 5.0–8.0)
PROTEIN UA: 100
SPEC GRAV UA: 1.015 (ref 1.010–1.025)
UROBILINOGEN UA: 1 U/dL

## 2017-02-07 LAB — POCT URINE PREGNANCY: PREG TEST UR: NEGATIVE

## 2017-02-07 MED ORDER — PROMETHAZINE HCL 25 MG PO TABS: 25.0000 mg | ORAL_TABLET | Freq: Four times a day (QID) | ORAL | 0 refills | Status: DC | PRN

## 2017-02-07 MED ORDER — CYCLOBENZAPRINE HCL 10 MG PO TABS: 10.0000 mg | ORAL_TABLET | Freq: Two times a day (BID) | ORAL | 0 refills | Status: DC

## 2017-02-07 MED ORDER — LOSARTAN POTASSIUM 50 MG PO TABS: 50.0000 mg | ORAL_TABLET | Freq: Every day | ORAL | 1 refills | Status: DC

## 2017-02-07 MED ORDER — HYDROCHLOROTHIAZIDE 25 MG PO TABS: 25.0000 mg | ORAL_TABLET | Freq: Every day | ORAL | 3 refills | Status: DC

## 2017-02-07 MED ORDER — METOPROLOL TARTRATE 100 MG PO TABS: 100.0000 mg | ORAL_TABLET | Freq: Two times a day (BID) | ORAL | 3 refills | Status: DC

## 2017-02-07 MED ORDER — INSULIN NPH ISOPHANE & REGULAR (70-30) 100 UNIT/ML ~~LOC~~ SUSP: SUBCUTANEOUS | 11 refills | Status: DC

## 2017-02-07 MED ORDER — ERYTHROMYCIN BASE 250 MG PO TABS: 250.0000 mg | ORAL_TABLET | Freq: Four times a day (QID) | ORAL | 0 refills | Status: DC

## 2017-02-07 NOTE — Progress Notes (Signed)
Subjective:  Patient ID: Shirley [name], female    DOB: [birth year]  Age: 41 y.o. MRN: [id number]  CC: abdominal pain  HPI Shirley [name] is a 41 y.o. female with a medical history of abscess of tunica vaginalis, anxiety, CAD, CVA, depression, DM2, fibromyalgia, gastritis, Gastroparesis, GERD, HLD, HTN, iron deficiency anemia, MI, migraines, obesity, OSA, peripheral neuropathy, PNA, substance abuse, and thrombocytosis thought to be 2/2 chronic hepatitis or iron deficiency anemia presents on ED f/u of abdominal pain. She has been seen at the ED approximately 21 times over the past 6 months with related complaints of abdominal pain, nausea, vomiting, gastroparesis. ED reviewed narcotic database and saw 15 prescriptions over the last 6 months from different providers. Was discharged on Reglan. Pt reports Reglan is ineffective.    BP is noted to be highly elevated at 187/156 mmHg. Retest at 155/103. States she has not taken her anti-hypertensives today.    Patient continues with RLQ pain secondary to Uterine fibroid embolization done on 12/07/16. Says the procedure was a failure as she is still bleeding and the pain is much worse. Says she feels like she is in childbirth everyday. Patient was referred to pain clinic but was rejected because pt does not have insurance. Admits to excessive narcotic use because her pain is unbearable.    Blood sugars levels "constantly high", can not recall numbers. Associated polydipsia, polyuria, visual blurring, fatigue, tingling, and numbness.   Outpatient Medications Prior to Visit  Medication Sig Dispense Refill  . albuterol (PROVENTIL HFA;VENTOLIN HFA) 108 (90 Base) MCG/ACT inhaler Inhale 2 puffs into the lungs every 4 (four) hours as needed for wheezing or shortness of breath. 1 Inhaler 5  . apixaban (ELIQUIS) 5 MG TABS tablet Take 1 tablet (5 mg total) by mouth 2 (two) times daily. 60 tablet 0  . dicyclomine (BENTYL) 20 MG tablet Take 1 tablet (20 mg total) by  mouth 2 (two) times daily. 20 tablet 0  . erythromycin ethylsuccinate (EES) 200 MG/5ML suspension Take 5 mLs (200 mg total) by mouth 3 (three) times daily. Take 15 minutes before each meal. 420 mL 0  . glucose blood (FREESTYLE TEST STRIPS) test strip Use as instructed 100 each 11  . glucose monitoring kit (FREESTYLE) monitoring kit 1 each by Does not apply route 4 (four) times daily - after meals and at bedtime. 1 month Diabetic Testing Supplies for QAC-QHS accuchecks. 1 each 1  . hydrochlorothiazide (HYDRODIURIL) 25 MG tablet Take 1 tablet (25 mg total) by mouth daily. Take on tablet in the morning. (Patient taking differently: Take 25 mg by mouth daily. ) 90 tablet 1  . insulin NPH-regular Human (NOVOLIN 70/30) (70-30) 100 UNIT/ML injection 35 units in the morning and 35 units in the evening. (Patient taking differently: Inject 35 Units into the skin 2 (two) times daily. ) 10 mL 11  . INSULIN SYRINGE .5CC/28G (INS SYRINGE/NEEDLE .5CC/28G) 28G X 1/2" 0.5 ML MISC Please provide 1 month supply 100 each 11  . Lancets (FREESTYLE) lancets Use as instructed 100 each 11  . losartan (COZAAR) 50 MG tablet Take 1 tablet (50 mg total) by mouth daily. 90 tablet 1  . metoprolol tartrate (LOPRESSOR) 50 MG tablet Take 1 tablet (50 mg total) by mouth 2 (two) times daily. 180 tablet 1  . promethazine (PHENERGAN) 25 MG tablet Take 1 tablet (25 mg total) by mouth every 6 (six) hours as needed for nausea or vomiting. 30 tablet 0  . ferrous sulfate 325 (65 FE)  MG tablet Take 1 tablet (325 mg total) by mouth 2 (two) times daily with a meal. (Patient not taking: Reported on 01/03/2017) 60 tablet 0  . HYDROcodone-acetaminophen (NORCO) 10-325 MG tablet Take 1 tablet by mouth every 8 (eight) hours as needed. (Patient not taking: Reported on 01/03/2017) 21 tablet 0  . LORazepam (ATIVAN) 1 MG tablet Take 1 tablet (1 mg total) by mouth 3 (three) times daily as needed (muscle relaxant). (Patient not taking: Reported on 02/07/2017) 15  tablet 0  . metoCLOPramide (REGLAN) 10 MG tablet Take 1 tablet (10 mg total) by mouth every 6 (six) hours as needed for nausea (nausea/headache). (Patient not taking: Reported on 02/07/2017) 10 tablet 0  . omeprazole (PRILOSEC) 40 MG capsule Take 1 capsule (40 mg total) by mouth daily. (Patient not taking: Reported on 01/03/2017) 90 capsule 1  . ondansetron (ZOFRAN ODT) 8 MG disintegrating tablet Take 1 tablet (8 mg total) by mouth every 8 (eight) hours as needed for nausea or vomiting. (Patient not taking: Reported on 01/03/2017) 20 tablet 0  . oxyCODONE-acetaminophen (PERCOCET/ROXICET) 5-325 MG tablet Take 1-2 tablets by mouth every 6 (six) hours as needed for moderate pain. (Patient not taking: Reported on 01/31/2017) 30 tablet 0  . zolpidem (AMBIEN) 5 MG tablet Take 1 tablet (5 mg total) by mouth at bedtime as needed for sleep. (Patient not taking: Reported on 02/07/2017) 30 tablet 1   No facility-administered medications prior to visit.      ROS Review of Systems  Constitutional: Positive for malaise/fatigue. Negative for chills and fever.  Eyes: Negative for blurred vision.  Respiratory: Negative for shortness of breath.   Cardiovascular: Negative for chest pain and palpitations.  Gastrointestinal: Positive for abdominal pain, nausea and vomiting.  Genitourinary: Negative for dysuria and hematuria.       Menorrhagia  Musculoskeletal: Negative for joint pain and myalgias.  Skin: Negative for rash.  Neurological: Negative for tingling and headaches.  Psychiatric/Behavioral: Positive for depression. The patient is not nervous/anxious.     Objective:  Wt 211 lb 12.8 oz (96.1 kg)   LMP 01/11/2017 (LMP Unknown)   BMI 33.17 kg/m   BP/Weight 02/07/2017 02/06/2017 09/32/3557  Systolic BP - 322 025  Diastolic BP - 96 66  Wt. (Lbs) 211.8 210.5 209  BMI 33.17 32.97 32.73      Physical Exam  Constitutional: She is oriented to person, place, and time.  Well developed, obese, in  discomfort, polite  HENT:  Head: Normocephalic and atraumatic.  Eyes: No scleral icterus.  Cardiovascular: Normal rate, regular rhythm and normal heart sounds.   Pulmonary/Chest: Effort normal and breath sounds normal.  Abdominal: Soft. Bowel sounds are normal. There is tenderness (moderate RLQ TTP).  Musculoskeletal: She exhibits no edema.  Neurological: She is alert and oriented to person, place, and time. No cranial nerve deficit. Coordination normal.  Skin: Skin is warm and dry. No rash noted. No erythema. No pallor.  Psychiatric: She has a normal mood and affect. Her behavior is normal. Thought content normal.  Tearful 2/2 depression and/or pain  Vitals reviewed.    Assessment & Plan:    1. Non-intractable vomiting with nausea, unspecified vomiting type - Refill promethazine (PHENERGAN) 25 MG tablet; Take 1 tablet (25 mg total) by mouth every 6 (six) hours as needed for nausea or vomiting.  Dispense: 60 tablet; Refill: 0 - Urinalysis Dipstick with glucose >1000 - POCT urine pregnancy negative in clinic today  2. Menorrhagia with irregular cycle - Has RLQ pain associated  to the Uterine fibroid embolization done on 12/07/16.  - Continue care with GYN. Refer to GYN note on 01/11/17 for treatment details.  3. Muscle spasm - Right lower back - Begin Cyclobenzaprine 10 mg BID  4. Gastroparesis - Begin erythromycin (E-MYCIN) 250 MG tablet; Take 1 tablet (250 mg total) by mouth 4 (four) times daily.  Dispense: 112 tablet; Refill: 0 - Not dispensed at [facility] at last visit.  5. Hypertension, unspecified type -Refill hydrochlorothiazide (HYDRODIURIL) 25 MG tablet; Take 1 tablet (25 mg total) by mouth daily. Take on tablet in the morning.  Dispense: 90 tablet; Refill: 3 - Increase metoprolol tartrate (LOPRESSOR) 100 MG tablet; Take 1 tablet (100 mg total) by mouth 2 (two) times daily.  Dispense: 180 tablet; Refill: 3 - Refill losartan (COZAAR) 50 MG tablet; Take 1 tablet (50 mg  total) by mouth daily.  Dispense: 90 tablet; Refill: 1 - CBC with Differential - Comprehensive metabolic panel  6. Essential hypertension - Uncontrolled. Noncompliant.   7. Type 2 diabetes mellitus with other specified complication, with long-term current use of insulin (HCC) - Suspect noncompliance - Increase insulin NPH-regular Human (NOVOLIN 70/30) (70-30) 100 UNIT/ML injection; 50 units in the morning and 50 units in the evening.  Dispense: 10 mL; Refill: 11. Specific instructions of increasing by one unit every two days given to patient.     Meds ordered this encounter  Medications  . promethazine (PHENERGAN) 25 MG tablet    Sig: Take 1 tablet (25 mg total) by mouth every 6 (six) hours as needed for nausea or vomiting.    Dispense:  60 tablet    Refill:  0    Order Specific Question:   Supervising Provider    Answer:   Tresa Garter [id number]  . erythromycin (E-MYCIN) 250 MG tablet    Sig: Take 1 tablet (250 mg total) by mouth 4 (four) times daily.    Dispense:  112 tablet    Refill:  0    Order Specific Question:   Supervising Provider    Answer:   Tresa Garter [id number]  . hydrochlorothiazide (HYDRODIURIL) 25 MG tablet    Sig: Take 1 tablet (25 mg total) by mouth daily. Take on tablet in the morning.    Dispense:  90 tablet    Refill:  3    Order Specific Question:   Supervising Provider    Answer:   Tresa Garter [id number]  . metoprolol tartrate (LOPRESSOR) 100 MG tablet    Sig: Take 1 tablet (100 mg total) by mouth 2 (two) times daily.    Dispense:  180 tablet    Refill:  3    Order Specific Question:   Supervising Provider    Answer:   Tresa Garter [id number]  . losartan (COZAAR) 50 MG tablet    Sig: Take 1 tablet (50 mg total) by mouth daily.    Dispense:  90 tablet    Refill:  1    Order Specific Question:   Supervising Provider    Answer:   Tresa Garter [id number]  . insulin NPH-regular Human (NOVOLIN 70/30) (70-30) 100  UNIT/ML injection    Sig: 50 units in the morning and 50 units in the evening.    Dispense:  10 mL    Refill:  11    Order Specific Question:   Supervising Provider    Answer:   Tresa Garter [id number]  . cyclobenzaprine (FLEXERIL) 10 MG tablet    Sig:  Take 1 tablet (10 mg total) by mouth 2 (two) times daily.    Dispense:  30 tablet    Refill:  0    Order Specific Question:   Supervising Provider    Answer:   Tresa Garter [id number]    Follow-up: Return in about 4 weeks (around 03/07/2017) for multiple comorbidities. 30 minutes please.   Clent Demark PA

## 2017-02-07 NOTE — Patient Instructions (Signed)

## 2017-02-08 ENCOUNTER — Emergency Department (HOSPITAL_COMMUNITY)
Admission: EM | Admit: 2017-02-08 | Discharge: 2017-02-09 | Disposition: A | Discharge status: Home / Self Care | Payer: Medicaid Other | Attending: Emergency Medicine | Admitting: Emergency Medicine

## 2017-02-08 ENCOUNTER — Encounter (HOSPITAL_COMMUNITY): Payer: Self-pay | Admitting: Emergency Medicine

## 2017-02-08 DIAGNOSIS — R112 Nausea with vomiting, unspecified: Secondary | ICD-10-CM | POA: Insufficient documentation

## 2017-02-08 DIAGNOSIS — R1115 Cyclical vomiting syndrome unrelated to migraine: Secondary | ICD-10-CM

## 2017-02-08 DIAGNOSIS — E785 Hyperlipidemia, unspecified: Secondary | ICD-10-CM | POA: Insufficient documentation

## 2017-02-08 DIAGNOSIS — R1031 Right lower quadrant pain: Secondary | ICD-10-CM | POA: Insufficient documentation

## 2017-02-08 DIAGNOSIS — Z8673 Personal history of transient ischemic attack (TIA), and cerebral infarction without residual deficits: Secondary | ICD-10-CM | POA: Insufficient documentation

## 2017-02-08 DIAGNOSIS — I2510 Atherosclerotic heart disease of native coronary artery without angina pectoris: Secondary | ICD-10-CM | POA: Insufficient documentation

## 2017-02-08 DIAGNOSIS — Z87891 Personal history of nicotine dependence: Secondary | ICD-10-CM | POA: Insufficient documentation

## 2017-02-08 DIAGNOSIS — E1165 Type 2 diabetes mellitus with hyperglycemia: Secondary | ICD-10-CM | POA: Insufficient documentation

## 2017-02-08 DIAGNOSIS — R109 Unspecified abdominal pain: Secondary | ICD-10-CM

## 2017-02-08 DIAGNOSIS — I252 Old myocardial infarction: Secondary | ICD-10-CM | POA: Insufficient documentation

## 2017-02-08 DIAGNOSIS — R739 Hyperglycemia, unspecified: Secondary | ICD-10-CM

## 2017-02-08 DIAGNOSIS — I10 Essential (primary) hypertension: Secondary | ICD-10-CM | POA: Insufficient documentation

## 2017-02-08 LAB — CBC WITH DIFFERENTIAL/PLATELET
BASOS ABS: 0 10*3/uL (ref 0.0–0.2)
Basos: 0 %
EOS (ABSOLUTE): 0.3 10*3/uL (ref 0.0–0.4)
Eos: 3 %
HEMATOCRIT: 33.1 % — AB (ref 34.0–46.6)
HEMOGLOBIN: 11 g/dL — AB (ref 11.1–15.9)
Immature Grans (Abs): 0 10*3/uL (ref 0.0–0.1)
Immature Granulocytes: 0 %
LYMPHS ABS: 3.2 10*3/uL — AB (ref 0.7–3.1)
Lymphs: 29 %
MCH: 24.8 pg — AB (ref 26.6–33.0)
MCHC: 33.2 g/dL (ref 31.5–35.7)
MCV: 75 fL — ABNORMAL LOW (ref 79–97)
MONOS ABS: 0.7 10*3/uL (ref 0.1–0.9)
Monocytes: 6 %
NEUTROS ABS: 6.9 10*3/uL (ref 1.4–7.0)
Neutrophils: 62 %
Platelets: 353 10*3/uL (ref 150–379)
RBC: 4.43 x10E6/uL (ref 3.77–5.28)
RDW: 16.3 % — AB (ref 12.3–15.4)
WBC: 11.1 10*3/uL — AB (ref 3.4–10.8)

## 2017-02-08 LAB — COMPREHENSIVE METABOLIC PANEL
ALBUMIN: 3.7 g/dL (ref 3.5–5.5)
ALT: 4 IU/L (ref 0–32)
AST: 13 IU/L (ref 0–40)
Albumin/Globulin Ratio: 1.1 — ABNORMAL LOW (ref 1.2–2.2)
Alkaline Phosphatase: 57 IU/L (ref 39–117)
BUN / CREAT RATIO: 10 (ref 9–23)
BUN: 11 mg/dL (ref 6–24)
Bilirubin Total: 0.3 mg/dL (ref 0.0–1.2)
CO2: 20 mmol/L (ref 20–29)
CREATININE: 1.06 mg/dL — AB (ref 0.57–1.00)
Calcium: 9.2 mg/dL (ref 8.7–10.2)
Chloride: 102 mmol/L (ref 96–106)
GFR calc non Af Amer: 64 mL/min/{1.73_m2} (ref >59)
GFR, EST AFRICAN AMERICAN: 74 mL/min/{1.73_m2} (ref >59)
GLUCOSE: 305 mg/dL — AB (ref 65–99)
Globulin, Total: 3.4 g/dL (ref 1.5–4.5)
Potassium: 3.9 mmol/L (ref 3.5–5.2)
Sodium: 137 mmol/L (ref 134–144)
TOTAL PROTEIN: 7.1 g/dL (ref 6.0–8.5)

## 2017-02-08 MED ORDER — ONDANSETRON 4 MG PO TBDP
4.0000 mg | ORAL_TABLET | Freq: Once | ORAL | Status: AC | PRN
  Administered 2017-02-09: 4 mg via ORAL

## 2017-02-08 MED ORDER — ONDANSETRON 4 MG PO TBDP
ORAL_TABLET | ORAL | Status: AC
  Filled 2017-02-08: qty 1

## 2017-02-08 NOTE — Telephone Encounter (Signed)
Left message asking patient to call office. Shirley [name] S Shirley [name], CMA  

## 2017-02-08 NOTE — ED Triage Notes (Signed)
Pt presents with n/v since yesterday with RLQ abd pain; pt states she has not been able to keep anything down including medications

## 2017-02-08 NOTE — Telephone Encounter (Signed)
-----   Message from Clent Demark, PA-C sent at 02/08/2017 10:37 AM EDT ----- Very mild anemia and needs better glucose control. We have already increased her insulin and she knows how to progressively increase insulin. Take OTC Gentle Iron pills.

## 2017-02-09 ENCOUNTER — Telehealth: Payer: Self-pay

## 2017-02-09 LAB — DIFFERENTIAL
BASOS ABS: 0 10*3/uL (ref 0.0–0.1)
Basophils Relative: 0 %
Eosinophils Absolute: 0.3 10*3/uL (ref 0.0–0.7)
Eosinophils Relative: 3 %
LYMPHS ABS: 2.2 10*3/uL (ref 0.7–4.0)
LYMPHS PCT: 22 %
Monocytes Absolute: 0.7 10*3/uL (ref 0.1–1.0)
Monocytes Relative: 7 %
NEUTROS ABS: 6.8 10*3/uL (ref 1.7–7.7)
NEUTROS PCT: 68 %

## 2017-02-09 LAB — COMPREHENSIVE METABOLIC PANEL
ALBUMIN: 3.6 g/dL (ref 3.5–5.0)
ALT: 9 U/L — ABNORMAL LOW (ref 14–54)
ANION GAP: 11 (ref 5–15)
AST: 18 U/L (ref 15–41)
Alkaline Phosphatase: 59 U/L (ref 38–126)
BILIRUBIN TOTAL: 0.3 mg/dL (ref 0.3–1.2)
BUN: 10 mg/dL (ref 6–20)
CO2: 22 mmol/L (ref 22–32)
Calcium: 9.5 mg/dL (ref 8.9–10.3)
Chloride: 100 mmol/L — ABNORMAL LOW (ref 101–111)
Creatinine, Ser: 1.23 mg/dL — ABNORMAL HIGH (ref 0.44–1.00)
GFR, EST NON AFRICAN AMERICAN: 53 mL/min — AB (ref >60)
GLUCOSE: 467 mg/dL — AB (ref 65–99)
POTASSIUM: 3.8 mmol/L (ref 3.5–5.1)
Sodium: 133 mmol/L — ABNORMAL LOW (ref 135–145)
TOTAL PROTEIN: 8.3 g/dL — AB (ref 6.5–8.1)

## 2017-02-09 LAB — LIPASE, BLOOD: Lipase: 39 U/L (ref 11–51)

## 2017-02-09 LAB — URINALYSIS, ROUTINE W REFLEX MICROSCOPIC
BACTERIA UA: NONE SEEN
Bilirubin Urine: NEGATIVE
Glucose, UA: >=500 mg/dL — AB
Ketones, ur: NEGATIVE mg/dL
Leukocytes, UA: NEGATIVE
Nitrite: NEGATIVE
PH: 6 (ref 5.0–8.0)
Protein, ur: NEGATIVE mg/dL
SPECIFIC GRAVITY, URINE: 1.015 (ref 1.005–1.030)

## 2017-02-09 LAB — CBC
HEMATOCRIT: 36.9 % (ref 36.0–46.0)
HEMOGLOBIN: 12.8 g/dL (ref 12.0–15.0)
MCH: 25.1 pg — ABNORMAL LOW (ref 26.0–34.0)
MCHC: 34.7 g/dL (ref 30.0–36.0)
MCV: 72.4 fL — ABNORMAL LOW (ref 78.0–100.0)
Platelets: 384 10*3/uL (ref 150–400)
RBC: 5.1 MIL/uL (ref 3.87–5.11)
RDW: 15.8 % — AB (ref 11.5–15.5)
WBC: 9.9 10*3/uL (ref 4.0–10.5)

## 2017-02-09 MED ORDER — PROMETHAZINE HCL 25 MG/ML IJ SOLN
12.5000 mg | Freq: Once | INTRAMUSCULAR | Status: AC
  Administered 2017-02-09: 12.5 mg via INTRAVENOUS
  Filled 2017-02-09: qty 1

## 2017-02-09 MED ORDER — HALOPERIDOL LACTATE 5 MG/ML IJ SOLN
2.0000 mg | Freq: Once | INTRAMUSCULAR | Status: AC
  Administered 2017-02-09: 2 mg via INTRAVENOUS
  Filled 2017-02-09: qty 1

## 2017-02-09 MED ORDER — PROMETHAZINE HCL 25 MG PO TABS
25.0000 mg | ORAL_TABLET | Freq: Four times a day (QID) | ORAL | 0 refills | Status: DC | PRN
Start: 2017-02-09 — End: 2017-08-13

## 2017-02-09 MED ORDER — PROMETHAZINE HCL 25 MG/ML IJ SOLN
25.0000 mg | Freq: Once | INTRAMUSCULAR | Status: AC
  Administered 2017-02-09: 25 mg via INTRAVENOUS
  Filled 2017-02-09: qty 1

## 2017-02-09 MED ORDER — INSULIN ASPART 100 UNIT/ML ~~LOC~~ SOLN
7.0000 [IU] | Freq: Once | SUBCUTANEOUS | Status: AC
  Administered 2017-02-09: 7 [IU] via INTRAVENOUS
  Filled 2017-02-09: qty 1

## 2017-02-09 MED ORDER — FENTANYL CITRATE (PF) 100 MCG/2ML IJ SOLN
100.0000 ug | Freq: Once | INTRAMUSCULAR | Status: AC
  Administered 2017-02-09: 100 ug via INTRAVENOUS
  Filled 2017-02-09: qty 2

## 2017-02-09 MED ORDER — PROMETHAZINE HCL 25 MG RE SUPP: 25.0000 mg | Freq: Four times a day (QID) | RECTAL | 0 refills | Status: DC | PRN

## 2017-02-09 MED ORDER — SODIUM CHLORIDE 0.9 % IV BOLUS (SEPSIS)
500.0000 mL | Freq: Once | INTRAVENOUS | Status: AC
  Administered 2017-02-09: 500 mL via INTRAVENOUS

## 2017-02-09 MED ORDER — SODIUM CHLORIDE 0.9 % IV BOLUS (SEPSIS)
1000.0000 mL | Freq: Once | INTRAVENOUS | Status: AC
  Administered 2017-02-09: 1000 mL via INTRAVENOUS

## 2017-02-09 NOTE — ED Notes (Signed)
Pt ambulatory to restroom

## 2017-02-09 NOTE — Telephone Encounter (Signed)
Patient aware of results and to better control glucose and also to get OTC gentle iron pills . Nat Christen, CMA

## 2017-02-09 NOTE — ED Notes (Signed)
Pt verbalizes understanding of d/c instructions. Pt received prescriptions. Pt ambulatory at d/c with all belongings.  

## 2017-02-09 NOTE — ED Provider Notes (Signed)
[facility] EMERGENCY DEPARTMENT Provider Note   CSN: [id number] Arrival date & time: 02/08/17  2309     History   Chief Complaint Chief Complaint  Patient presents with  . Emesis  . Abdominal Pain    HPI Shirley [name] is a 41 y.o. female.  The history is provided by the patient.  She has a history of diabetes, gastroparesis, chronic abdominal pain and comes in complaining of right lower abdominal pain which started yesterday and is getting worse.  She rates pain a 10/10.  Nothing makes it better, nothing makes it worse.  There is associated nausea and vomiting.  She cannot count the number of times she has vomited.  She denies fever or chills but has had some cold sweats.  She denies constipation or diarrhea.  She denies urinary difficulty.  She took over-the-counter acetaminophen, but vomited following that.  She states that she is out of her pain medication and out of her promethazine.  She states promethazine is the only thing that helps her nausea.  Past Medical History:  Diagnosis Date  . Abscess of tunica vaginalis    10/09- Abundant S. aureus- sensitive to all abx  . Anxiety   . Blood dyscrasia   . CAD (coronary artery disease) 06/15/2006   s/p Subendocardial MI with PDA angioplasty(no stent) on 06/15/06 and relook  cath 06/19/06 showed patency of site. Cath 12/10- no restenosis or significant CAD progression  . CVA (cerebral vascular accident) ([place]) ~ 02/2014   denies residual on 04/22/2014  . CVA (cerebral vascular accident) [facility])    history of remote right cerebellar infarct noted on head CT at least since 10/2011  . Depression   . Diabetes mellitus type 2, uncontrolled, with complications ([place])   . Fibromyalgia   . Gastritis   . Gastroparesis    secondary to poorly controlled DM, last emptying study performed 01/2010  was normal but may be falsely positive as pt was on reglan  . GERD (gastroesophageal reflux disease)   . Hepatitis B, chronic  (HCC)    Hep BeAb+,Hep B cAb+ & Hep BsAg+ (9/06)  . History of pyelonephritis    H/o GrpB Pyelonephritis (9/06) and UTI- 07/11- E.Coli, 12/10- GBS  . Hyperlipidemia   . Hypertension   . Iron deficiency anemia   . Irregular menses    Small ovarian follicles seen on PZ(0/25)  . MI (myocardial infarction) ([place]) 05/2006   PDA percutaneous transluminal coronary angioplasty  . Migraine    "weekly" (04/22/2014)  . N&V (nausea and vomiting)    Chronic. Unclear etiology with multiple admission and ED visits. CT abdomen with and without contrast (02/2011)  showed no acute process. Gastic Emptying scan (01/2010) was normal. Ultrasound of the abdomen was within normal limits. Hepatitis B viral load was undectable. HIV NR. EGD - gastritis, Hpylori + s/p Rx  . Obesity   . OSA (obstructive sleep apnea)    "suppose to wear mask but I don't" (04/22/2014)  . Peripheral neuropathy   . Pneumonia    "this is probably the 2nd or 3rd time I've had pneumonia" (04/22/2014)  . Recurrent boils   . Seasonal asthma   . Substance abuse ([place])   . Thrombocytosis ([place])    Hem/Onc suggested 2/2 chronic hepatits and/or iron deficiency anemia    Patient Active Problem List   Diagnosis Date Noted  . Chronic maxillary sinusitis 01/02/2017  . Acute blood loss anemia 11/13/2016  . Menorrhagia 11/13/2016  . Bilateral pulmonary  embolism ([place]) 10/30/2016  . Acute DVT (deep venous thrombosis) ([employer]) 10/30/2016  . Type 2 diabetes mellitus with hyperglycemia ([place]) 04/07/2016  . Diabetic gastroparesis ([place]) 04/06/2016  . Dehydration 03/27/2015  . Gastroparesis 11/11/2014  . Hematemesis 10/13/2014  . DKA (diabetic ketoacidoses) ([place]) 10/13/2014  . Tachycardia 07/21/2014  . Abdominal pain, chronic, left lower quadrant   . Diabetic gastroparesis associated with type 2 diabetes mellitus ([place]) 04/28/2014  . History of Helicobacter pylori infection 04/22/2014  . Vaginal discharge 02/18/2014  . Atypical chest pain 07/22/2013   . Unspecified constipation 07/21/2013  . Tinea corporis 07/21/2013  . Intractable vomiting 04/29/2013  . Dysmenorrhea 04/22/2013  . UTI (urinary tract infection) 07/15/2012  . Headache(784.0) 02/08/2012  . Health care maintenance 01/22/2012  . Chronic hepatitis B ([place]) 03/07/2011  . History of leukocytosis 04/06/2010  . THROMBOCYTOSIS 04/06/2010  . Polysubstance abuse ([place]) 02/23/2010  . Iron deficiency anemia 11/22/2009  . PERIPHERAL NEUROPATHY 10/01/2009  . Hyperlipidemia 08/30/2009  . Diabetic polyneuropathy ([place]) 08/30/2009  . Hidradenitis (recurrent boils) 07/07/2008  . Depression 12/27/2007  . Abdominal pain, left lower quadrant 11/21/2007  . FIBROMYALGIA 10/30/2007  . BACK PAIN 04/01/2007  . OBSTRUCTIVE SLEEP APNEA 01/17/2007  . ANXIETY DEPRESSION 06/27/2006  . Chronic ischemic heart disease 06/15/2006  . OBESITY, MORBID 05/15/2006  . MIGRAINE HEADACHE 05/15/2006  . Asthma 05/15/2006  . Essential hypertension 01/16/2006  . IRREGULAR MENSTRUATION 01/16/2006  . PEDAL EDEMA 01/16/2006  . Poorly controlled type 2 diabetes mellitus with peripheral neuropathy ([place]) 01/16/1989    Past Surgical History:  Procedure Laterality Date  . CESAREAN SECTION  1997  . CORONARY ANGIOPLASTY WITH STENT PLACEMENT  2008   "2 stents"  . ESOPHAGOGASTRODUODENOSCOPY N/A 04/23/2014   Procedure: ESOPHAGOGASTRODUODENOSCOPY (EGD);  Surgeon: Winfield Cunas., MD;  Location: [facility] ENDOSCOPY;  Service: Endoscopy;  Laterality: N/A;  . IR ANGIOGRAM PELVIS SELECTIVE OR SUPRASELECTIVE  12/07/2016  . IR ANGIOGRAM PELVIS SELECTIVE OR SUPRASELECTIVE  12/07/2016  . IR ANGIOGRAM SELECTIVE EACH ADDITIONAL VESSEL  12/07/2016  . IR ANGIOGRAM SELECTIVE EACH ADDITIONAL VESSEL  12/07/2016  . IR EMBO ARTERIAL NOT HEMORR HEMANG INC GUIDE ROADMAPPING  12/07/2016  . IR RADIOLOGIST EVAL & MGMT  01/09/2017  . IR US GUIDE VASC ACCESS RIGHT  12/07/2016    OB History    Gravida Para Term Preterm AB Living   2 2 2           SAB TAB Ectopic Multiple Live Births           2       Home Medications    Prior to Admission medications   Medication Sig Start Date End Date Taking? Authorizing Provider  albuterol (PROVENTIL HFA;VENTOLIN HFA) 108 (90 Base) MCG/ACT inhaler Inhale 2 puffs into the lungs every 4 (four) hours as needed for wheezing or shortness of breath. 09/27/16   Clent Demark, PA-C  apixaban (ELIQUIS) 5 MG TABS tablet Take 1 tablet (5 mg total) by mouth 2 (two) times daily. 11/18/16   Rice, Resa Miner, MD  cyclobenzaprine (FLEXERIL) 10 MG tablet Take 1 tablet (10 mg total) by mouth 2 (two) times daily. 02/07/17   Clent Demark, PA-C  erythromycin (E-MYCIN) 250 MG tablet Take 1 tablet (250 mg total) by mouth 4 (four) times daily. 02/07/17   Clent Demark, PA-C  glucose blood (FREESTYLE TEST STRIPS) test strip Use as instructed 09/05/16   Clent Demark, PA-C  glucose monitoring kit (FREESTYLE) monitoring kit 1 each by Does not apply  route 4 (four) times daily - after meals and at bedtime. 1 month Diabetic Testing Supplies for QAC-QHS accuchecks. 09/05/16   Clent Demark, PA-C  hydrochlorothiazide (HYDRODIURIL) 25 MG tablet Take 1 tablet (25 mg total) by mouth daily. Take on tablet in the morning. 02/07/17   Clent Demark, PA-C  insulin NPH-regular Human (NOVOLIN 70/30) (70-30) 100 UNIT/ML injection 50 units in the morning and 50 units in the evening. 02/07/17   Clent Demark, PA-C  INSULIN SYRINGE .5CC/28G (INS SYRINGE/NEEDLE .5CC/28G) 28G X 1/2" 0.5 ML MISC Please provide 1 month supply 01/02/17   Clent Demark, PA-C  Lancets (FREESTYLE) lancets Use as instructed 01/02/17   Clent Demark, PA-C  losartan (COZAAR) 50 MG tablet Take 1 tablet (50 mg total) by mouth daily. 02/07/17   Clent Demark, PA-C  metoprolol tartrate (LOPRESSOR) 100 MG tablet Take 1 tablet (100 mg total) by mouth 2 (two) times daily. 02/07/17   Clent Demark, PA-C  promethazine (PHENERGAN)  25 MG tablet Take 1 tablet (25 mg total) by mouth every 6 (six) hours as needed for nausea or vomiting. 02/07/17   Clent Demark, PA-C    Family History Family History  Problem Relation Age of Onset  . Diabetes Father     Social History Social History  Substance Use Topics  . Smoking status: Former Smoker    Types: Cigarettes    Quit date: 04/24/1996  . Smokeless tobacco: Never Used     Comment: quit smoking cigarettes age 83  . Alcohol use No     Comment: 04/22/2014 "might have a few drinks a month"     Allergies   Lisinopril and Morphine and related   Review of Systems Review of Systems  All other systems reviewed and are negative.    Physical Exam Updated Vital Signs BP (!) 183/105 (BP Location: Right Arm)   Pulse (!) 103   Temp 98.3 F (36.8 C) (Oral)   Resp (!) 21   Ht 5' 7"  (1.702 m)   Wt 95.7 kg (211 lb)   LMP 01/11/2017 (LMP Unknown)   SpO2 100%   BMI 33.05 kg/m   Physical Exam  Nursing note and vitals reviewed.  41 year old female, rocking back and forth in pain, but in no acute distress. Vital signs are significant for hypertension, tachycardia, tachypnea. Oxygen saturation is 100%, which is normal. Head is normocephalic and atraumatic. PERRLA, EOMI. Oropharynx is clear. Neck is nontender and supple without adenopathy or JVD. Back is nontender and there is no CVA tenderness. Lungs are clear without rales, wheezes, or rhonchi. Chest is nontender. Heart has regular rate and rhythm without murmur. Abdomen is soft, flat, with moderate right lower quadrant tenderness.  There is no rebound or guarding.  There are no masses or hepatosplenomegaly and peristalsis is hypoactive. Extremities have no cyanosis or edema, full range of motion is present. Skin is warm and dry without rash. Neurologic: Mental status is normal, cranial nerves are intact, there are no motor or sensory deficits.  ED Treatments / Results  Labs (all labs ordered are listed, but  only abnormal results are displayed) Labs Reviewed  COMPREHENSIVE METABOLIC PANEL - Abnormal; Notable for the following:       Result Value   Sodium 133 (*)    Chloride 100 (*)    Glucose, Bld 467 (*)    Creatinine, Ser 1.23 (*)    Total Protein 8.3 (*)    ALT 9 (*)  GFR calc non Af Amer 53 (*)    All other components within normal limits  CBC - Abnormal; Notable for the following:    MCV 72.4 (*)    MCH 25.1 (*)    RDW 15.8 (*)    All other components within normal limits  URINALYSIS, ROUTINE W REFLEX MICROSCOPIC - Abnormal; Notable for the following:    Color, Urine COLORLESS (*)    Glucose, UA >=500 (*)    Hgb urine dipstick LARGE (*)    Squamous Epithelial / LPF 0-5 (*)    All other components within normal limits  LIPASE, BLOOD  DIFFERENTIAL    EKG  EKG Interpretation  Date/Time:  Thursday February 08 2017 23:15:54 EDT Ventricular Rate:  129 PR Interval:  120 QRS Duration: 72 QT Interval:  312 QTC Calculation: 457 R Axis:   88 Text Interpretation:  Sinus tachycardia with occasional Premature ventricular complexes Nonspecific T wave abnormality Abnormal ECG When compared with ECG of 01/12/2017, Premature ventricular complexes are now present Confirmed by Delora Fuel ([id number]) on 02/08/2017 11:30:25 PM      Procedures Procedures (including critical care time)  Medications Ordered in ED Medications  sodium chloride 0.9 % bolus 1,000 mL (not administered)  promethazine (PHENERGAN) injection 25 mg (not administered)  haloperidol lactate (HALDOL) injection 2 mg (not administered)  fentaNYL (SUBLIMAZE) injection 100 mcg (not administered)  ondansetron (ZOFRAN-ODT) disintegrating tablet 4 mg (4 mg Oral Given 02/09/17 0003)     Initial Impression / Assessment and Plan / ED Course  I have reviewed the triage vital signs and the nursing notes.  Pertinent labs & imaging results that were available during my care of the patient were reviewed by me and considered in my  medical decision making (see chart for details).  Exacerbation of chronic abdominal pain and chronic nausea and vomiting.  Old records are reviewed, and she has multiple ED visits and hospitalizations for abdominal pain and intractable vomiting.  I have reviewed her record on the [place] controlled substance reporting website, and she had 90 oxycodone-acetaminophen 10-325 tablets filled on September 25, and 30 oxycodone-acetaminophen 5-325 tablets filled on October 4.  She is clearly abusing her medication if she is indeed out.  She will be given IV fluids, haloperidol, promethazine, fentanyl.  Laboratory workup shows hyperglycemia which will be managed with IV fluids and IV insulin.  After above-noted treatment, patient seemed more comfortable but stated she was not feeling any better.  She is given a second dose of fentanyl with better improvement in pain, but now is complaining of ongoing nausea.  She is given additional promethazine with good relief of nausea.  At this point, she felt well enough to go home.  She is discharged with prescription for promethazine-both tablet and suppository.  Advised to work with her pain management physician said that she does not run out of her narcotic prescriptions early.  Final Clinical Impressions(s) / ED Diagnoses   Final diagnoses:  Non-intractable cyclical vomiting with nausea  Abdominal pain, unspecified abdominal location  Hyperglycemia    New Prescriptions New Prescriptions   PROMETHAZINE (PHENERGAN) 25 MG SUPPOSITORY    Place 1 suppository (25 mg total) rectally every 6 (six) hours as needed for nausea or vomiting.     Delora Fuel, MD 86/75/44 [phone]

## 2017-02-09 NOTE — Telephone Encounter (Signed)
-----   Message from Clent Demark, PA-C sent at 02/08/2017 10:37 AM EDT ----- Very mild anemia and needs better glucose control. We have already increased her insulin and she knows how to progressively increase insulin. Take OTC Gentle Iron pills.

## 2017-02-09 NOTE — Discharge Instructions (Signed)
Please work with your pain management doctor so that you don't run out of your pain medication before it is time for a new prescription.

## 2017-02-14 ENCOUNTER — Telehealth: Payer: Self-pay

## 2017-02-14 NOTE — Telephone Encounter (Signed)
Message received from Haze Justin, RN CM noting the patient's frequent ED visits. Attempted to contact the patient to inquire if there are any community services/support that might benefit her.  Call placed to # [phone] and the call was dropped after 6 rings.  Call placed to # [phone] and a HIPAA compliant voicemail message was left requesting a call back to # [phone].

## 2017-02-16 ENCOUNTER — Telehealth: Payer: Self-pay | Admitting: Physician Assistant

## 2017-02-16 NOTE — Telephone Encounter (Signed)
Call placed to patient to inquire if there are any community services/support that might benefit her. Called [phone] it rings several times but then the call drops. Left patient a message on [phone] asking her to return my call at [phone].

## 2017-02-17 IMAGING — DX DG CHEST 2V
2 series · 2 of 2 positions shown · non-contrast
Comparison: May 05, 2014

CLINICAL DATA: Three-day history of chest pain

EXAM:
CHEST  2 VIEW

[chest pa]
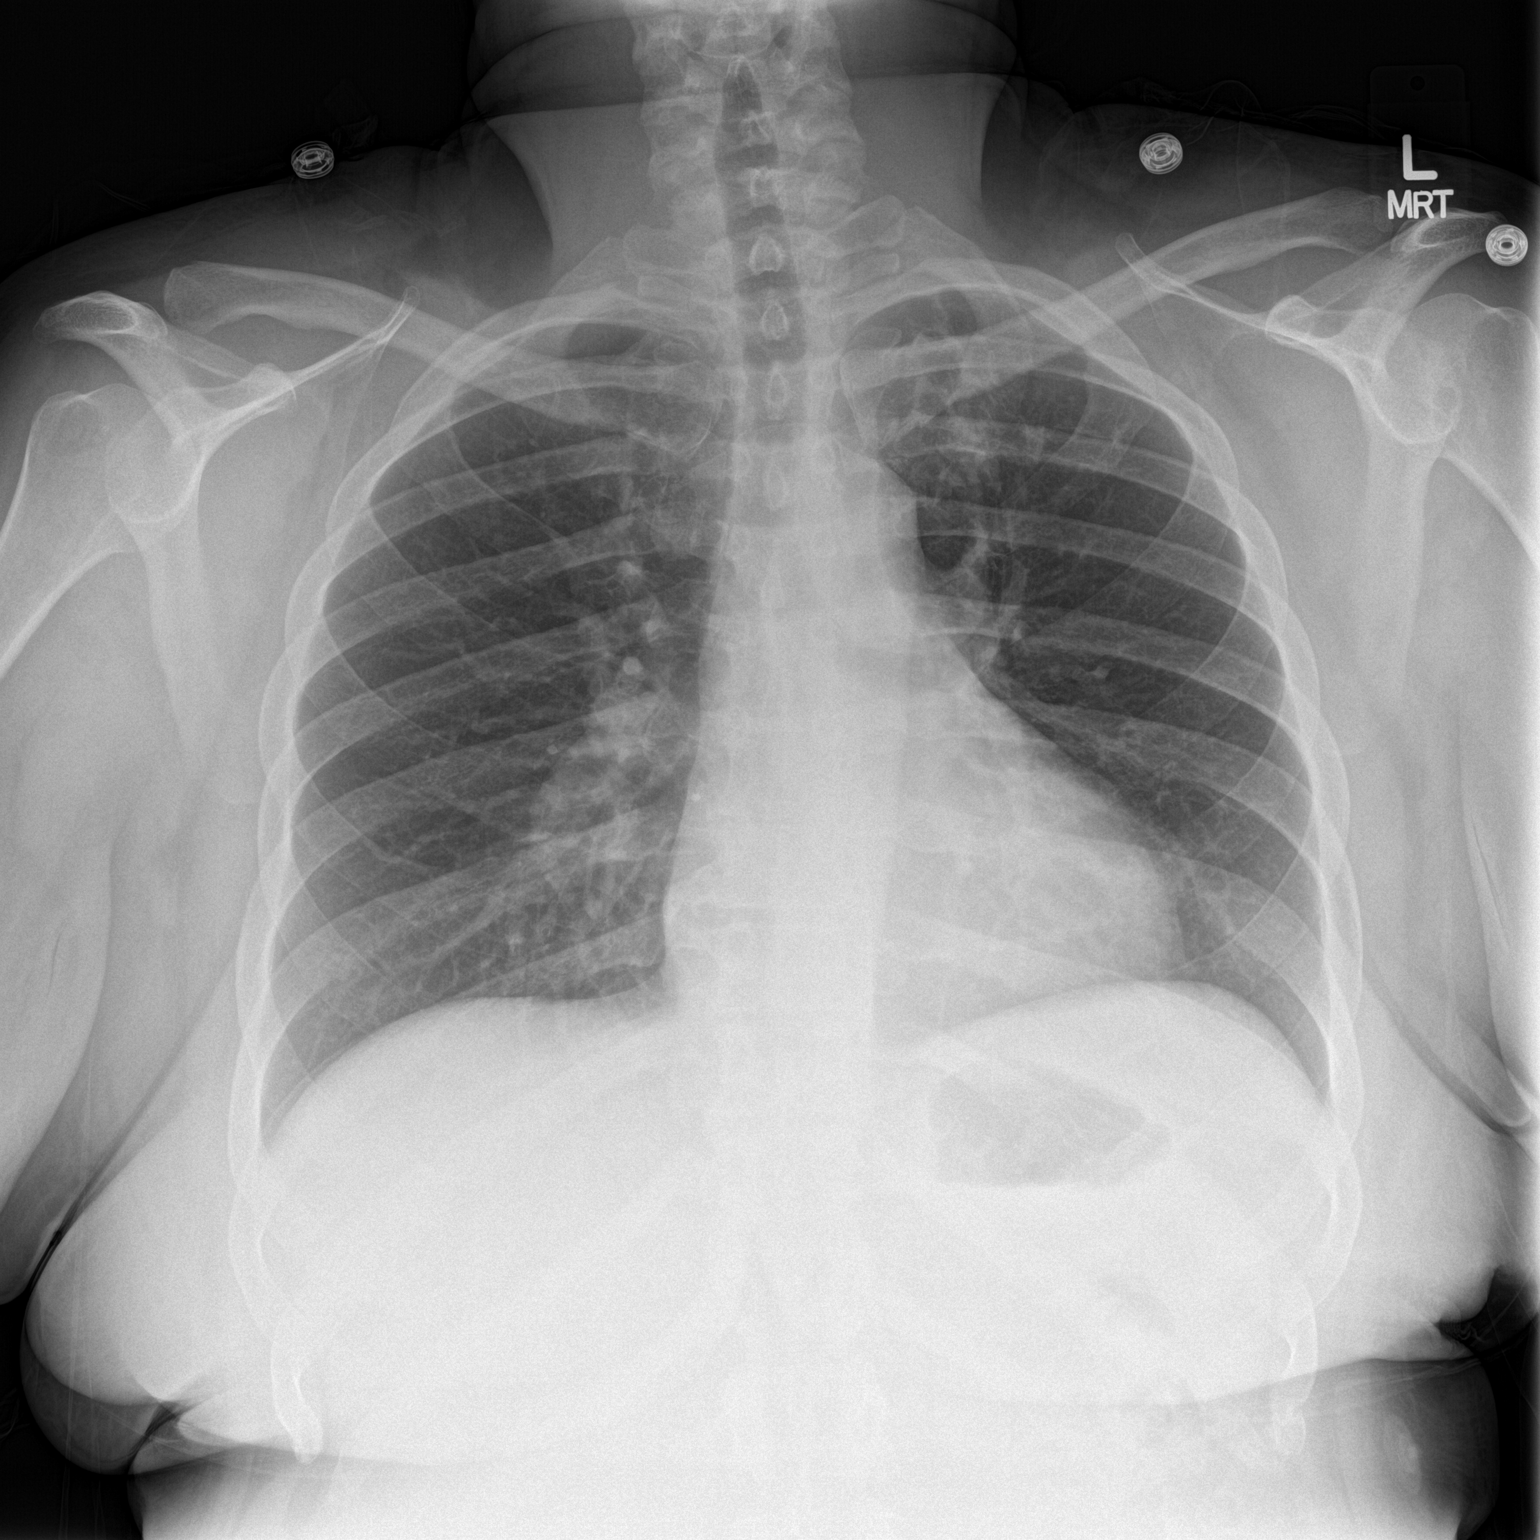

[chest lat]
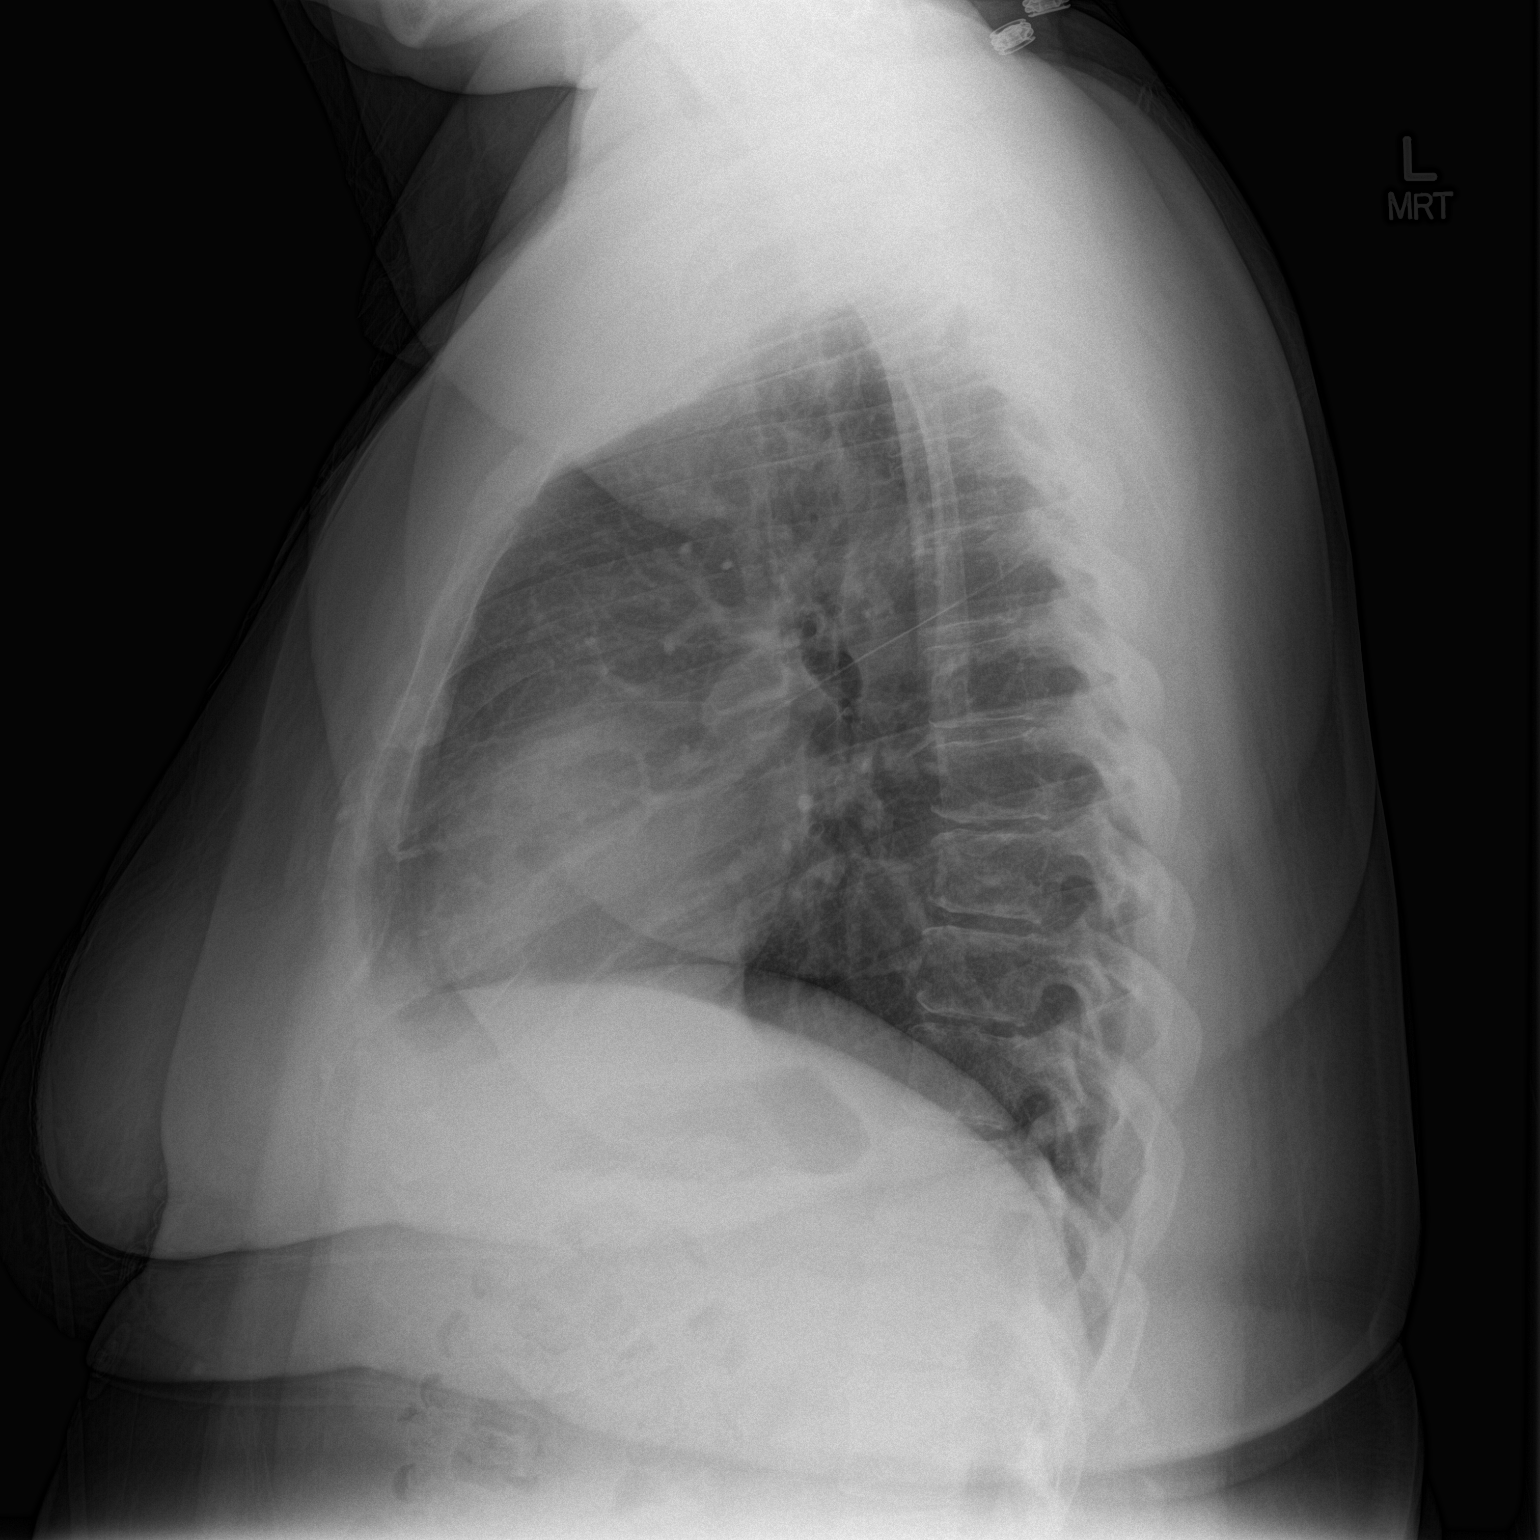

[2 of 2 positions shown; findings below may reference images not displayed]

FINDINGS: There is no edema or consolidation. Heart size and pulmonary
vascularity are within normal limits. No adenopathy. No
pneumothorax. No bone lesions.
IMPRESSION: No edema or consolidation.

## 2017-02-21 ENCOUNTER — Telehealth: Payer: Self-pay

## 2017-02-21 NOTE — Telephone Encounter (Signed)
Attempted to contact the patient discuss the need for community resources /support. Call placed to # [phone] (M) and the call was dropped after 8 rings.  call placed to #  [phone] (H) and a HIPAA compliant voicemail message was left requesting a call back to # [phone].

## 2017-02-22 ENCOUNTER — Ambulatory Visit: Payer: Medicaid Other | Admitting: Obstetrics & Gynecology

## 2017-02-27 IMAGING — CR DG CHEST 2V
2 series · 2 of 2 positions shown · non-contrast
Comparison: 07/20/2014

CLINICAL DATA: Central chest pain of several hr duration

EXAM:
CHEST  2 VIEW

[w chest lat]
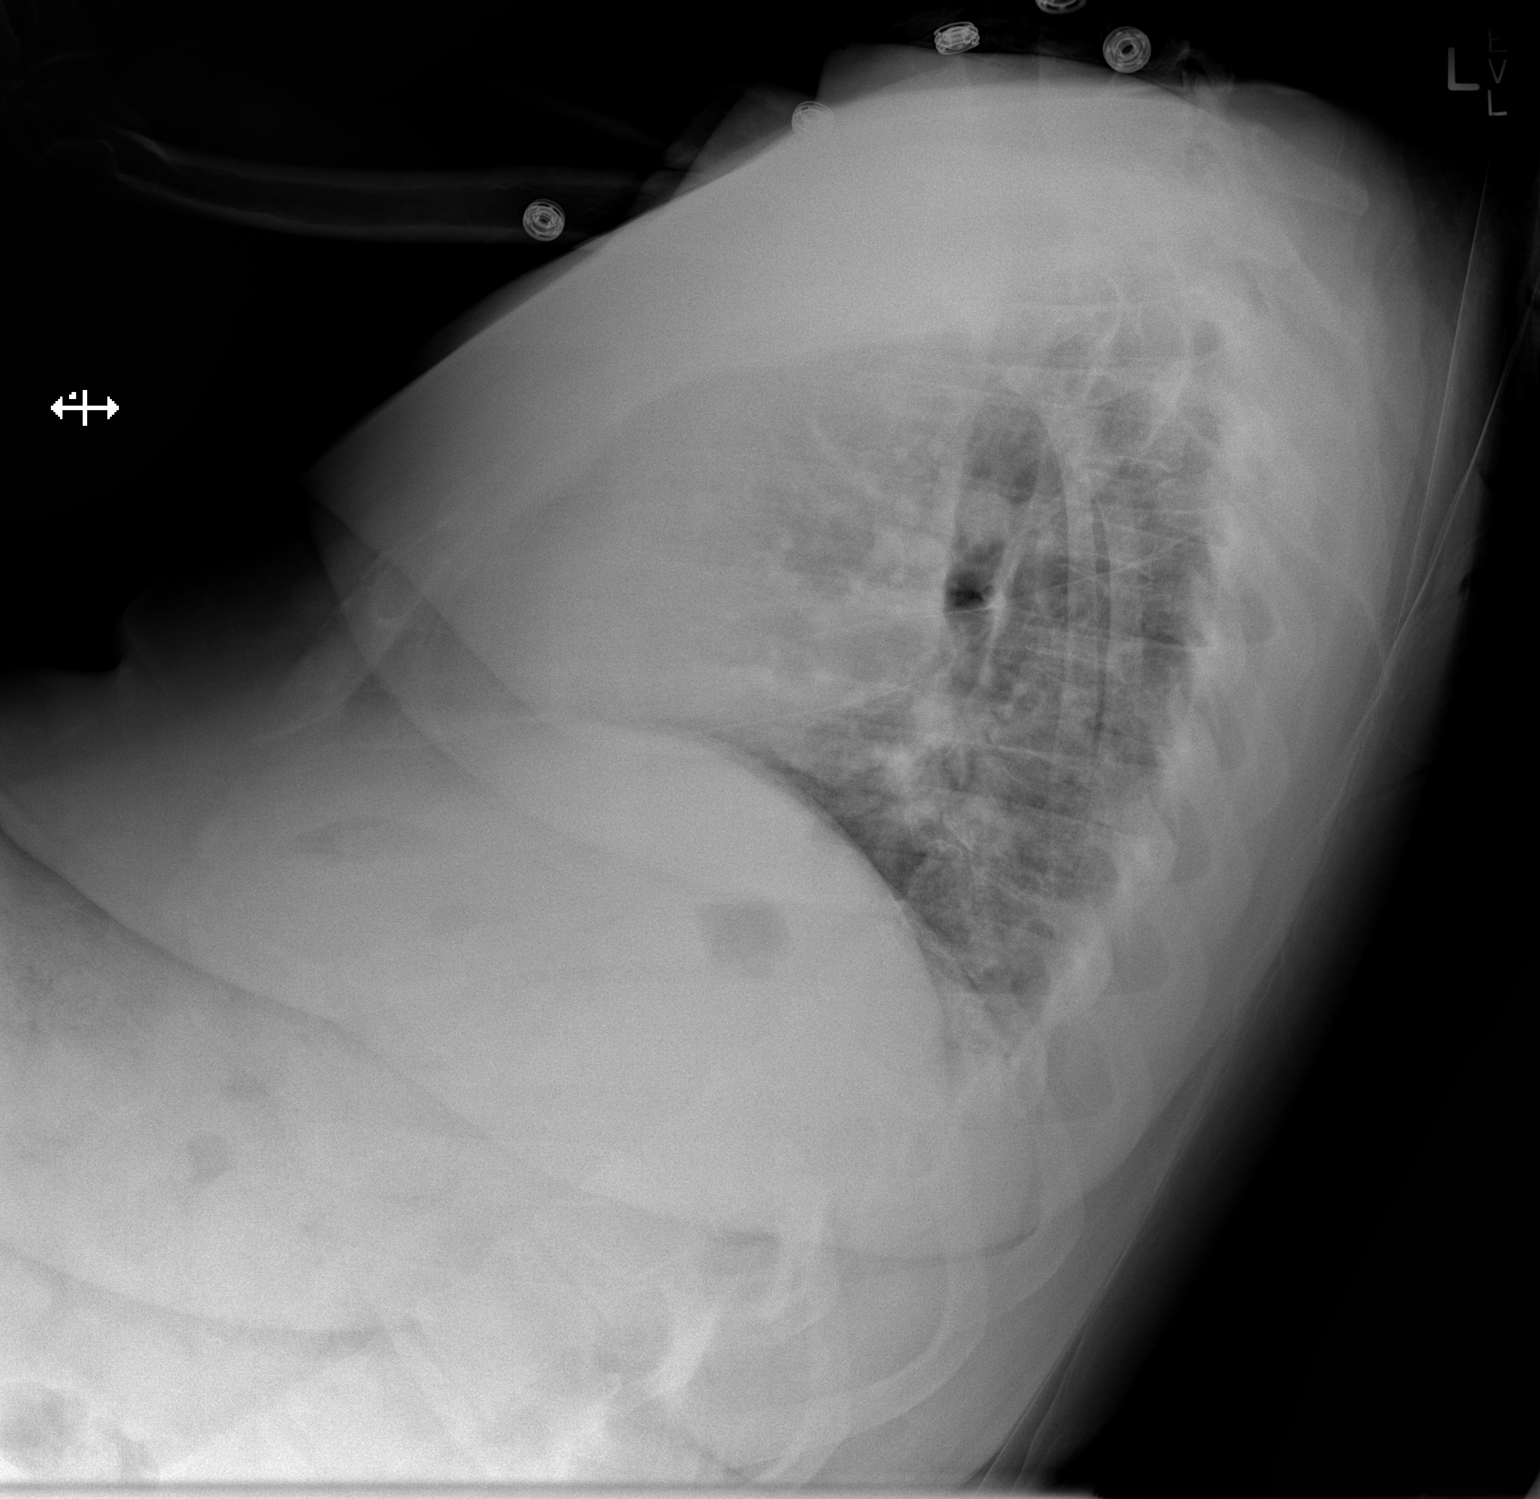

[x chest ap]
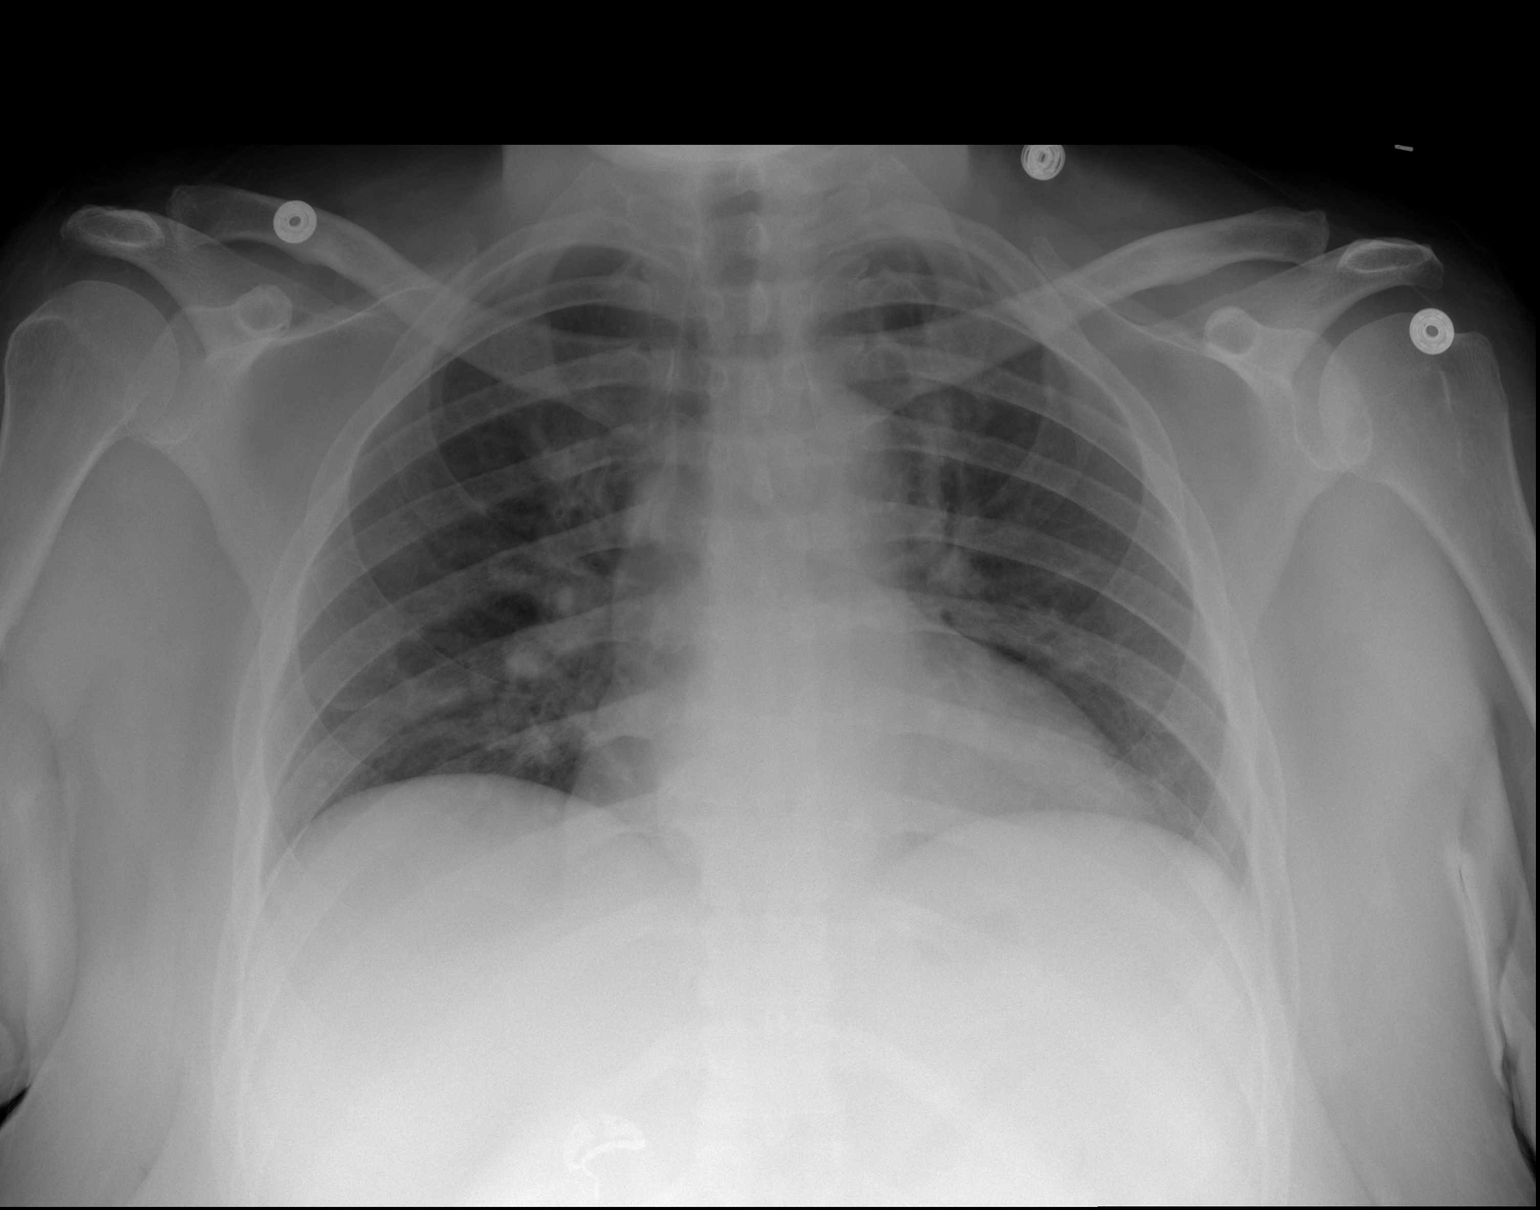

[2 of 2 positions shown; findings below may reference images not displayed]

FINDINGS: There is borderline enlargement of the cardiac silhouette but heart
size is accentuated on this study due to the AP projection. The
lungs are clear with basilar crowding due to a shallow inspiration.
Hilar and mediastinal contours are unremarkable and unchanged. There
are no effusions.
IMPRESSION: No active cardiopulmonary disease.

## 2017-03-07 ENCOUNTER — Ambulatory Visit (INDEPENDENT_AMBULATORY_CARE_PROVIDER_SITE_OTHER): Payer: Medicaid Other | Admitting: Physician Assistant

## 2017-03-12 ENCOUNTER — Encounter (HOSPITAL_COMMUNITY): Payer: Self-pay | Admitting: Emergency Medicine

## 2017-03-12 ENCOUNTER — Other Ambulatory Visit: Payer: Self-pay

## 2017-03-12 ENCOUNTER — Emergency Department (HOSPITAL_COMMUNITY)
Admission: EM | Admit: 2017-03-12 | Discharge: 2017-03-12 | Disposition: A | Discharge status: Home / Self Care | Payer: Self-pay | Attending: Emergency Medicine | Admitting: Emergency Medicine

## 2017-03-12 DIAGNOSIS — H538 Other visual disturbances: Secondary | ICD-10-CM | POA: Insufficient documentation

## 2017-03-12 DIAGNOSIS — Y9389 Activity, other specified: Secondary | ICD-10-CM | POA: Insufficient documentation

## 2017-03-12 DIAGNOSIS — Y999 Unspecified external cause status: Secondary | ICD-10-CM | POA: Insufficient documentation

## 2017-03-12 DIAGNOSIS — S0501XA Injury of conjunctiva and corneal abrasion without foreign body, right eye, initial encounter: Secondary | ICD-10-CM | POA: Insufficient documentation

## 2017-03-12 DIAGNOSIS — I252 Old myocardial infarction: Secondary | ICD-10-CM | POA: Insufficient documentation

## 2017-03-12 DIAGNOSIS — Z7901 Long term (current) use of anticoagulants: Secondary | ICD-10-CM | POA: Insufficient documentation

## 2017-03-12 DIAGNOSIS — I10 Essential (primary) hypertension: Secondary | ICD-10-CM | POA: Insufficient documentation

## 2017-03-12 DIAGNOSIS — D649 Anemia, unspecified: Secondary | ICD-10-CM | POA: Insufficient documentation

## 2017-03-12 DIAGNOSIS — Z8673 Personal history of transient ischemic attack (TIA), and cerebral infarction without residual deficits: Secondary | ICD-10-CM | POA: Insufficient documentation

## 2017-03-12 DIAGNOSIS — I2510 Atherosclerotic heart disease of native coronary artery without angina pectoris: Secondary | ICD-10-CM | POA: Insufficient documentation

## 2017-03-12 DIAGNOSIS — Z794 Long term (current) use of insulin: Secondary | ICD-10-CM | POA: Insufficient documentation

## 2017-03-12 DIAGNOSIS — Z9119 Patient's noncompliance with other medical treatment and regimen: Secondary | ICD-10-CM | POA: Insufficient documentation

## 2017-03-12 DIAGNOSIS — Z87891 Personal history of nicotine dependence: Secondary | ICD-10-CM | POA: Insufficient documentation

## 2017-03-12 DIAGNOSIS — J45909 Unspecified asthma, uncomplicated: Secondary | ICD-10-CM | POA: Insufficient documentation

## 2017-03-12 DIAGNOSIS — Y929 Unspecified place or not applicable: Secondary | ICD-10-CM | POA: Insufficient documentation

## 2017-03-12 DIAGNOSIS — X58XXXA Exposure to other specified factors, initial encounter: Secondary | ICD-10-CM | POA: Insufficient documentation

## 2017-03-12 DIAGNOSIS — E1143 Type 2 diabetes mellitus with diabetic autonomic (poly)neuropathy: Secondary | ICD-10-CM | POA: Insufficient documentation

## 2017-03-12 DIAGNOSIS — Z23 Encounter for immunization: Secondary | ICD-10-CM | POA: Insufficient documentation

## 2017-03-12 DIAGNOSIS — Z79899 Other long term (current) drug therapy: Secondary | ICD-10-CM | POA: Insufficient documentation

## 2017-03-12 MED ORDER — IBUPROFEN 400 MG PO TABS
400.0000 mg | ORAL_TABLET | Freq: Once | ORAL | Status: AC | PRN
  Administered 2017-03-12: 400 mg via ORAL
  Filled 2017-03-12: qty 1

## 2017-03-12 MED ORDER — TETANUS-DIPHTH-ACELL PERTUSSIS 5-2.5-18.5 LF-MCG/0.5 IM SUSP
0.5000 mL | Freq: Once | INTRAMUSCULAR | Status: AC
  Administered 2017-03-12: 0.5 mL via INTRAMUSCULAR
  Filled 2017-03-12: qty 0.5

## 2017-03-12 MED ORDER — TETRACAINE HCL 0.5 % OP SOLN
2.0000 [drp] | Freq: Once | OPHTHALMIC | Status: AC
  Administered 2017-03-12: 2 [drp] via OPHTHALMIC
  Filled 2017-03-12: qty 4

## 2017-03-12 MED ORDER — SULFACETAMIDE SODIUM 10 % OP SOLN
2.0000 [drp] | OPHTHALMIC | Status: DC
  Administered 2017-03-12: 2 [drp] via OPHTHALMIC
  Filled 2017-03-12: qty 15

## 2017-03-12 MED ORDER — FLUORESCEIN SODIUM 1 MG OP STRP
2.0000 | ORAL_STRIP | Freq: Once | OPHTHALMIC | Status: AC
  Administered 2017-03-12: 2 via OPHTHALMIC
  Filled 2017-03-12: qty 2

## 2017-03-12 MED ORDER — IBUPROFEN 200 MG PO TABS
600.0000 mg | ORAL_TABLET | Freq: Once | ORAL | Status: AC
  Administered 2017-03-12: 600 mg via ORAL
  Filled 2017-03-12: qty 1

## 2017-03-12 NOTE — Discharge Instructions (Signed)
Please put 1-2 drops in your right eye 4 times a day.  Please do this for 5 days.  Please take Ibuprofen (Advil, motrin) and Tylenol (acetaminophen) to relieve your pain.  You may take up to 600 MG (3 pills) of normal strength ibuprofen every 8 hours as needed.  In between doses of ibuprofen you make take tylenol, up to 1,000 mg (two extra strength pills).  Do not take more than 3,000 mg tylenol in a 24 hour period.  Please check all medication labels as many medications such as pain and cold medications may contain tylenol.  Do not drink alcohol while taking these medications.  Do not take other NSAID'S while taking ibuprofen (such as aleve or naproxen).  Please take ibuprofen with food to decrease stomach upset.  Please follow-up with the eye doctor.  They may ask for a co-pay, however when you tell them it is for an emergency room follow-up it will be greatly reduced.  To failure to follow-up may result in permanent visual changes or loss of vision.

## 2017-03-12 NOTE — ED Triage Notes (Addendum)
Pt reports she believes she "scratched my cornia three days ago as I was scratching my eye...  It is only getting worse."    Pt is unable to allow RN to look at the eye.  She reports her vision is blurry and looks "rainbowish."  Pt hypertensive in triage, out of blood pressure medication

## 2017-03-12 NOTE — ED Provider Notes (Signed)
[facility] EMERGENCY DEPARTMENT Provider Note   CSN: [id number] Arrival date & time: 03/12/17  0301     History   Chief Complaint Chief Complaint  Patient presents with  . Eye Pain    HPI Shirley [name] is a 41 y.o. female with a history of frequent ED visits, fibromyalgia, noncompliance with medicines, substance abuse, CVA, CAD, anxiety, hypertension who presents today for evaluation of left eye pain.  She reports that approximately 3 days ago she was scratching/rubbing her left eye and accidentally scratched her eye with her nail.  She reports that she has not seen anyone about this.  She has not attempted any home care.  She reports that her blood pressure is normally high, however she does not know what medicines she usually takes.  She also reports a sinus infection for under 1 week.  She has associated sinus pressure, rhinorrhea, and congestion.    HPI  Past Medical History:  Diagnosis Date  . Abscess of tunica vaginalis    10/09- Abundant S. aureus- sensitive to all abx  . Anxiety   . Blood dyscrasia   . CAD (coronary artery disease) 06/15/2006   s/p Subendocardial MI with PDA angioplasty(no stent) on 06/15/06 and relook  cath 06/19/06 showed patency of site. Cath 12/10- no restenosis or significant CAD progression  . CVA (cerebral vascular accident) ([place]) ~ 02/2014   denies residual on 04/22/2014  . CVA (cerebral vascular accident) [facility])    history of remote right cerebellar infarct noted on head CT at least since 10/2011  . Depression   . Diabetes mellitus type 2, uncontrolled, with complications ([place])   . Fibromyalgia   . Gastritis   . Gastroparesis    secondary to poorly controlled DM, last emptying study performed 01/2010  was normal but may be falsely positive as pt was on reglan  . GERD (gastroesophageal reflux disease)   . Hepatitis B, chronic (HCC)    Hep BeAb+,Hep B cAb+ & Hep BsAg+ (9/06)  . History of pyelonephritis    H/o GrpB  Pyelonephritis (9/06) and UTI- 07/11- E.Coli, 12/10- GBS  . Hyperlipidemia   . Hypertension   . Iron deficiency anemia   . Irregular menses    Small ovarian follicles seen on XB(3/53)  . MI (myocardial infarction) ([place]) 05/2006   PDA percutaneous transluminal coronary angioplasty  . Migraine    "weekly" (04/22/2014)  . N&V (nausea and vomiting)    Chronic. Unclear etiology with multiple admission and ED visits. CT abdomen with and without contrast (02/2011)  showed no acute process. Gastic Emptying scan (01/2010) was normal. Ultrasound of the abdomen was within normal limits. Hepatitis B viral load was undectable. HIV NR. EGD - gastritis, Hpylori + s/p Rx  . Obesity   . OSA (obstructive sleep apnea)    "suppose to wear mask but I don't" (04/22/2014)  . Peripheral neuropathy   . Pneumonia    "this is probably the 2nd or 3rd time I've had pneumonia" (04/22/2014)  . Recurrent boils   . Seasonal asthma   . Substance abuse ([place])   . Thrombocytosis ([place])    Hem/Onc suggested 2/2 chronic hepatits and/or iron deficiency anemia    Patient Active Problem List   Diagnosis Date Noted  . Chronic maxillary sinusitis 01/02/2017  . Acute blood loss anemia 11/13/2016  . Menorrhagia 11/13/2016  . Bilateral pulmonary embolism ([place]) 10/30/2016  . Acute DVT (deep venous thrombosis) ([place]) 10/30/2016  . Type 2 diabetes mellitus with hyperglycemia (  [place]) 04/07/2016  . Diabetic gastroparesis ([place]) 04/06/2016  . Dehydration 03/27/2015  . Gastroparesis 11/11/2014  . Hematemesis 10/13/2014  . DKA (diabetic ketoacidoses) ([place]) 10/13/2014  . Tachycardia 07/21/2014  . Abdominal pain, chronic, left lower quadrant   . Diabetic gastroparesis associated with type 2 diabetes mellitus ([place]) 04/28/2014  . History of Helicobacter pylori infection 04/22/2014  . Vaginal discharge 02/18/2014  . Atypical chest pain 07/22/2013  . Unspecified constipation 07/21/2013  . Tinea corporis 07/21/2013  . Intractable  vomiting 04/29/2013  . Dysmenorrhea 04/22/2013  . UTI (urinary tract infection) 07/15/2012  . Headache(784.0) 02/08/2012  . Health care maintenance 01/22/2012  . Chronic hepatitis B ([place]) 03/07/2011  . History of leukocytosis 04/06/2010  . THROMBOCYTOSIS 04/06/2010  . Polysubstance abuse ([place]) 02/23/2010  . Iron deficiency anemia 11/22/2009  . PERIPHERAL NEUROPATHY 10/01/2009  . Hyperlipidemia 08/30/2009  . Diabetic polyneuropathy ([place]) 08/30/2009  . Hidradenitis (recurrent boils) 07/07/2008  . Depression 12/27/2007  . Abdominal pain, left lower quadrant 11/21/2007  . FIBROMYALGIA 10/30/2007  . BACK PAIN 04/01/2007  . OBSTRUCTIVE SLEEP APNEA 01/17/2007  . ANXIETY DEPRESSION 06/27/2006  . Chronic ischemic heart disease 06/15/2006  . OBESITY, MORBID 05/15/2006  . MIGRAINE HEADACHE 05/15/2006  . Asthma 05/15/2006  . Essential hypertension 01/16/2006  . IRREGULAR MENSTRUATION 01/16/2006  . PEDAL EDEMA 01/16/2006  . Poorly controlled type 2 diabetes mellitus with peripheral neuropathy ([place]) 01/16/1989    Past Surgical History:  Procedure Laterality Date  . CESAREAN SECTION  1997  . CORONARY ANGIOPLASTY WITH STENT PLACEMENT  2008   "2 stents"  . ESOPHAGOGASTRODUODENOSCOPY (EGD) N/A 04/23/2014   Performed by Winfield Cunas., MD at [facility] ENDOSCOPY  . IR ANGIOGRAM PELVIS SELECTIVE OR SUPRASELECTIVE  12/07/2016  . IR ANGIOGRAM PELVIS SELECTIVE OR SUPRASELECTIVE  12/07/2016  . IR ANGIOGRAM SELECTIVE EACH ADDITIONAL VESSEL  12/07/2016  . IR ANGIOGRAM SELECTIVE EACH ADDITIONAL VESSEL  12/07/2016  . IR EMBO ARTERIAL NOT HEMORR HEMANG INC GUIDE ROADMAPPING  12/07/2016  . IR RADIOLOGIST EVAL & MGMT  01/09/2017  . IR US GUIDE VASC ACCESS RIGHT  12/07/2016    OB History    Gravida Para Term Preterm AB Living   2 2 2          SAB TAB Ectopic Multiple Live Births           2       Home Medications    Prior to Admission medications   Medication Sig Start Date End Date Taking?  Authorizing Provider  albuterol (PROVENTIL HFA;VENTOLIN HFA) 108 (90 Base) MCG/ACT inhaler Inhale 2 puffs into the lungs every 4 (four) hours as needed for wheezing or shortness of breath. 09/27/16   Clent Demark, PA-C  apixaban (ELIQUIS) 5 MG TABS tablet Take 1 tablet (5 mg total) by mouth 2 (two) times daily. 11/18/16   Rice, Resa Miner, MD  cyclobenzaprine (FLEXERIL) 10 MG tablet Take 1 tablet (10 mg total) by mouth 2 (two) times daily. 02/07/17   Clent Demark, PA-C  erythromycin (E-MYCIN) 250 MG tablet Take 1 tablet (250 mg total) by mouth 4 (four) times daily. 02/07/17   Clent Demark, PA-C  glucose blood (FREESTYLE TEST STRIPS) test strip Use as instructed 09/05/16   Clent Demark, PA-C  glucose monitoring kit (FREESTYLE) monitoring kit 1 each by Does not apply route 4 (four) times daily - after meals and at bedtime. 1 month Diabetic Testing Supplies for QAC-QHS accuchecks. 09/05/16   Clent Demark, PA-C  hydrochlorothiazide (HYDRODIURIL)  25 MG tablet Take 1 tablet (25 mg total) by mouth daily. Take on tablet in the morning. 02/07/17   Clent Demark, PA-C  insulin NPH-regular Human (NOVOLIN 70/30) (70-30) 100 UNIT/ML injection 50 units in the morning and 50 units in the evening. 02/07/17   Clent Demark, PA-C  INSULIN SYRINGE .5CC/28G (INS SYRINGE/NEEDLE .5CC/28G) 28G X 1/2" 0.5 ML MISC Please provide 1 month supply 01/02/17   Clent Demark, PA-C  Lancets (FREESTYLE) lancets Use as instructed 01/02/17   Clent Demark, PA-C  losartan (COZAAR) 50 MG tablet Take 1 tablet (50 mg total) by mouth daily. 02/07/17   Clent Demark, PA-C  metoprolol tartrate (LOPRESSOR) 100 MG tablet Take 1 tablet (100 mg total) by mouth 2 (two) times daily. 02/07/17   Clent Demark, PA-C  promethazine (PHENERGAN) 25 MG suppository Place 1 suppository (25 mg total) rectally every 6 (six) hours as needed for nausea or vomiting. 88/28/00   Delora Fuel, MD  promethazine  (PHENERGAN) 25 MG tablet Take 1 tablet (25 mg total) by mouth every 6 (six) hours as needed for nausea or vomiting. 34/91/79   Delora Fuel, MD    Family History Family History  Problem Relation Age of Onset  . Diabetes Father     Social History Social History   Tobacco Use  . Smoking status: Former Smoker    Types: Cigarettes    Last attempt to quit: 04/24/1996    Years since quitting: 20.8  . Smokeless tobacco: Never Used  . Tobacco comment: quit smoking cigarettes age 54  Substance Use Topics  . Alcohol use: No    Alcohol/week: 0.0 oz    Comment: 04/22/2014 "might have a few drinks a month"  . Drug use: No    Comment: 04/22/2104 "quit drugs ~ 1-2 yr ago"     Allergies   Lisinopril and Morphine and related   Review of Systems Review of Systems  Constitutional: Negative for fever.  HENT: Positive for congestion, postnasal drip, rhinorrhea and sinus pressure. Negative for trouble swallowing.   Eyes: Positive for photophobia, pain and visual disturbance. Negative for discharge and redness.  Respiratory: Negative for shortness of breath.   Skin: Negative for rash.     Physical Exam Updated Vital Signs BP (!) 161/109 (BP Location: Right Arm)   Pulse 99   Temp 98.2 F (36.8 C) (Oral)   Resp 16   Ht 5' 7"  (1.702 m)   Wt 95.3 kg (210 lb)   SpO2 94%   BMI 32.89 kg/m   Physical Exam  Constitutional: She is oriented to person, place, and time. She appears well-developed and well-nourished.  HENT:  Head: Normocephalic and atraumatic.  Mouth/Throat: Oropharynx is clear and moist.  Eyes: Conjunctivae, EOM and lids are normal. Pupils are equal, round, and reactive to light. Right eye exhibits no discharge. No foreign body present in the right eye. Left eye exhibits no discharge. No foreign body present in the left eye.  Slit lamp exam:      The right eye shows no corneal flare and no corneal ulcer.       The left eye shows corneal abrasion and fluorescein uptake. The  left eye shows no corneal flare, no corneal ulcer and no foreign body.  Neck: Normal range of motion. Neck supple.  Lymphadenopathy:    She has no cervical adenopathy.  Neurological: She is alert and oriented to person, place, and time.  Skin: No rash noted. She is not diaphoretic.  There are no rashes present to the face.  Nursing note and vitals reviewed.    ED Treatments / Results  Labs (all labs ordered are listed, but only abnormal results are displayed) Labs Reviewed - No data to display  EKG  EKG Interpretation None       Radiology No results found.  Procedures Procedures (including critical care time)  Medications Ordered in ED Medications  ibuprofen (ADVIL,MOTRIN) tablet 600 mg (not administered)  sulfacetamide (BLEPH-10) 10 % ophthalmic solution 2 drop (not administered)  ibuprofen (ADVIL,MOTRIN) tablet 400 mg (400 mg Oral Given 03/12/17 0342)  fluorescein ophthalmic strip 2 strip (2 strips Both Eyes Given 03/12/17 0932)  tetracaine (PONTOCAINE) 0.5 % ophthalmic solution 2 drop (2 drops Both Eyes Given 03/12/17 0932)     Initial Impression / Assessment and Plan / ED Course  I have reviewed the triage vital signs and the nursing notes.  Pertinent labs & imaging results that were available during my care of the patient were reviewed by me and considered in my medical decision making (see chart for details).    Corneal abrasion  Pt with corneal abrasion on PE. Tdap given. No evidence of FB.  Patient refused visual exam testing of the affected eye even after tetracaine eye drops.  Pt is not a contact lens wearer.  Exam non-concerning for orbital cellulitis, hyphema.. Patient will be discharged home with bleph 10 eye drops and instructions on how to use them.   Patient understands to follow up with ophthalmology today, & to return to ER if new symptoms develop including change in vision, purulent drainage, or entrapment.   Final Clinical Impressions(s) / ED  Diagnoses   Final diagnoses:  Abrasion of right cornea, initial encounter    ED Discharge Orders    None       Ollen Gross 03/12/17 [place], MD 03/12/17 1106

## 2017-03-21 ENCOUNTER — Ambulatory Visit: Payer: Medicaid Other | Admitting: Obstetrics & Gynecology

## 2017-05-14 IMAGING — US US ABDOMEN COMPLETE
1 series · 14 of 25 positions shown · non-contrast
Comparison: CT abdomen pelvis 11/27/2015.

CLINICAL DATA: Generalized abdominal pain for 3 years with acute
worsening in the past week. Possible upper abdominal mass on
physical exam.

EXAM:
ABDOMEN ULTRASOUND COMPLETE

[Series 1: us abdomen complete · 0.23mm/px · 14 of 77 slices shown]
[im 1/77]
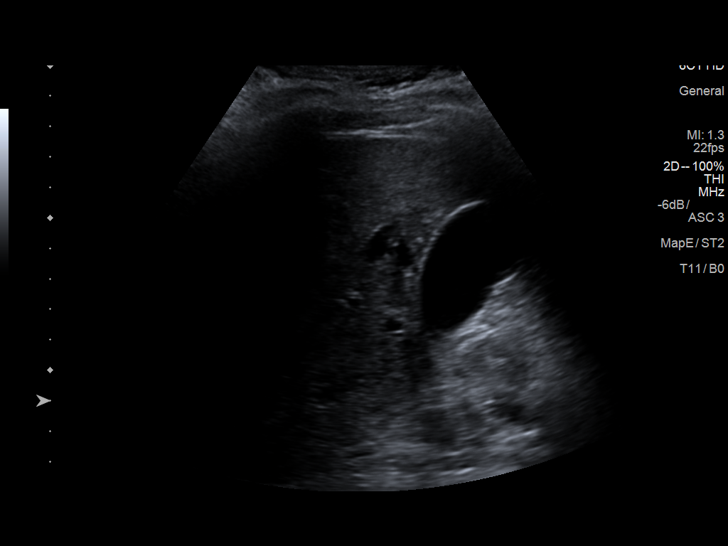
[im 7/77]
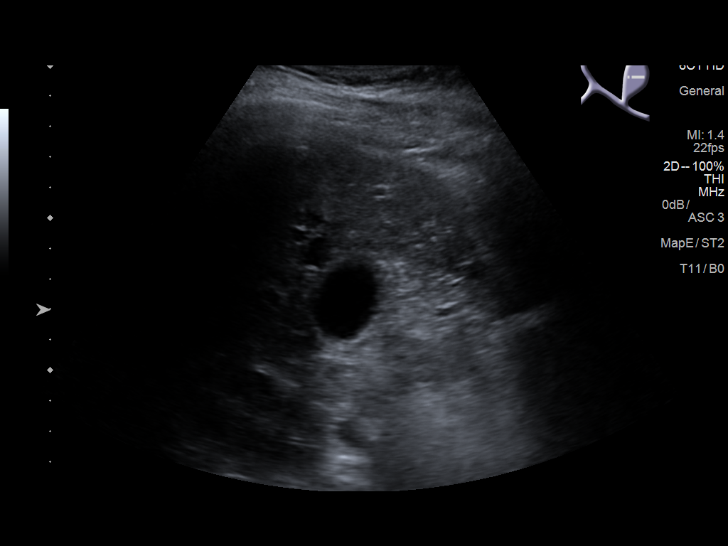
[im 13/77]
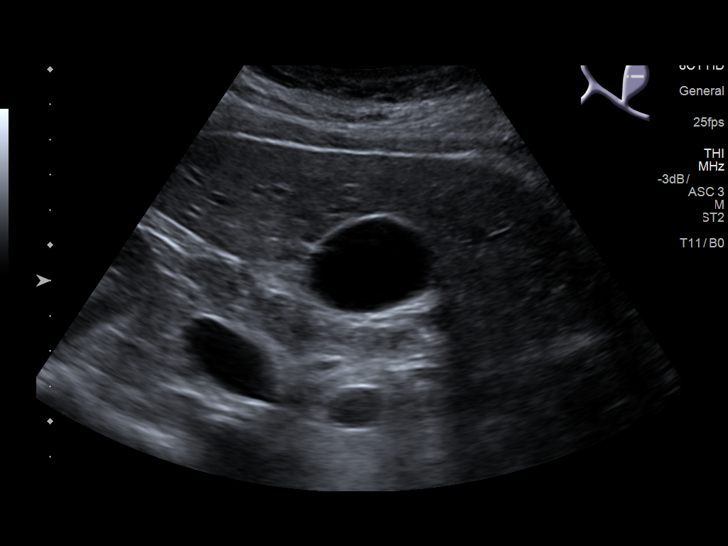
[im 20/77]
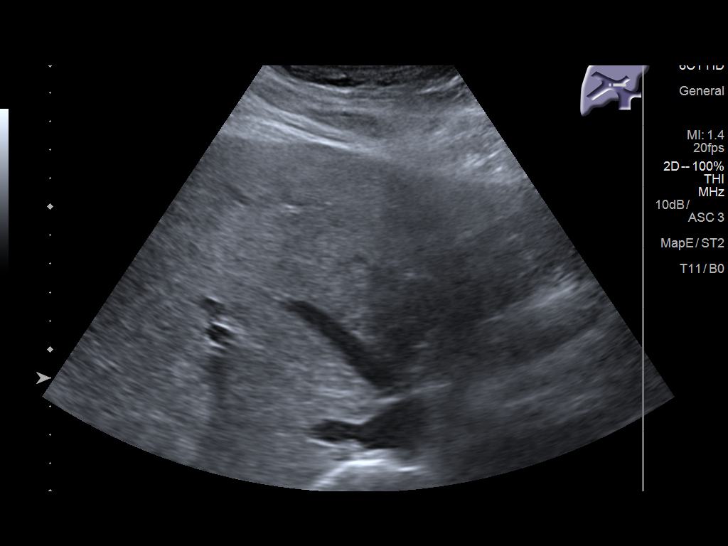
[im 26/77]
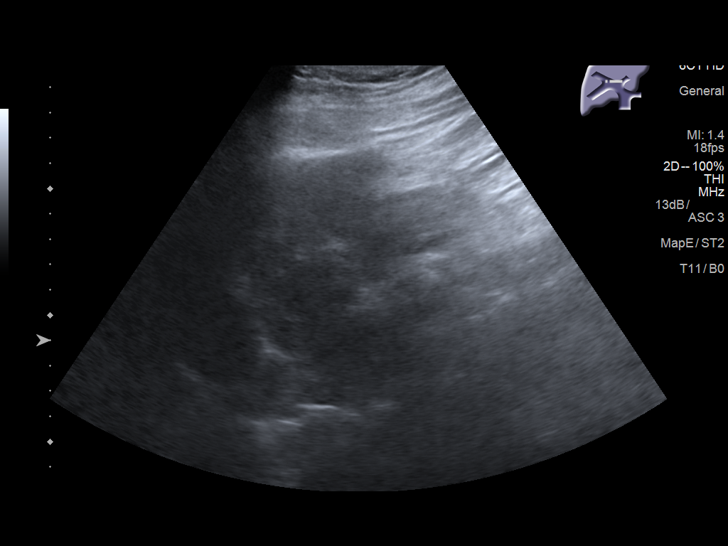
[im 29/77]
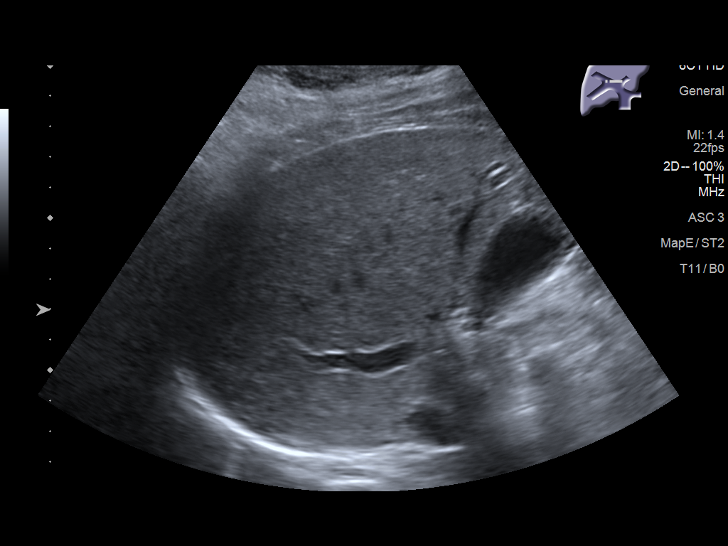
[im 35/77]
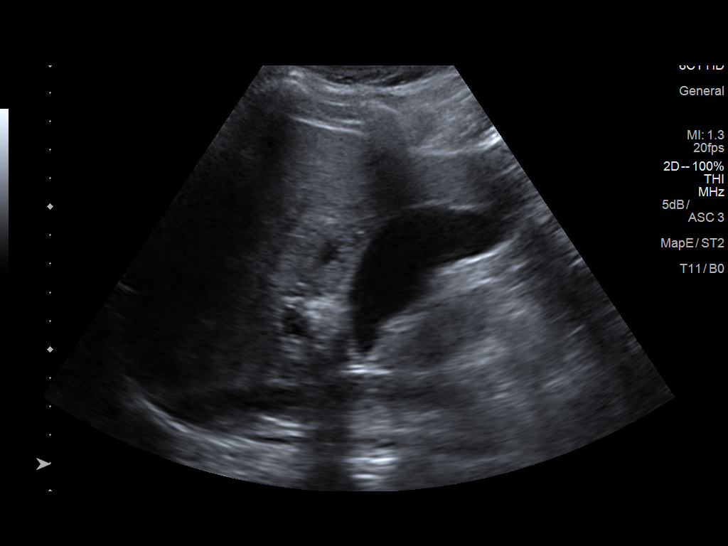
[im 42/77]
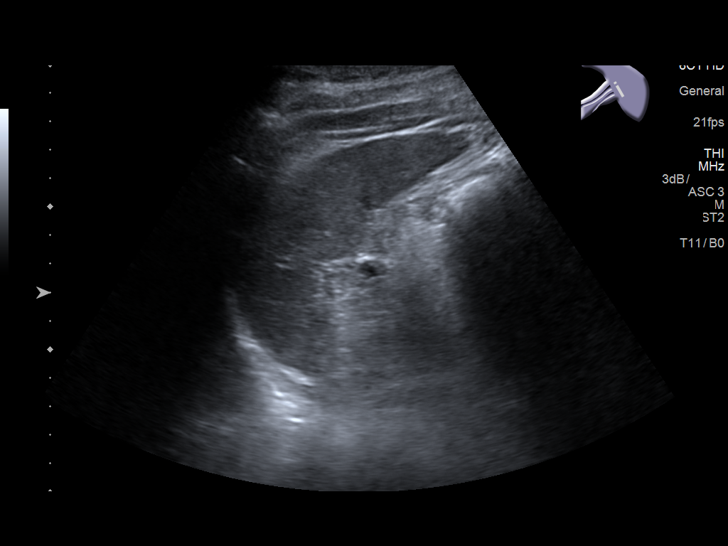
[im 48/77]
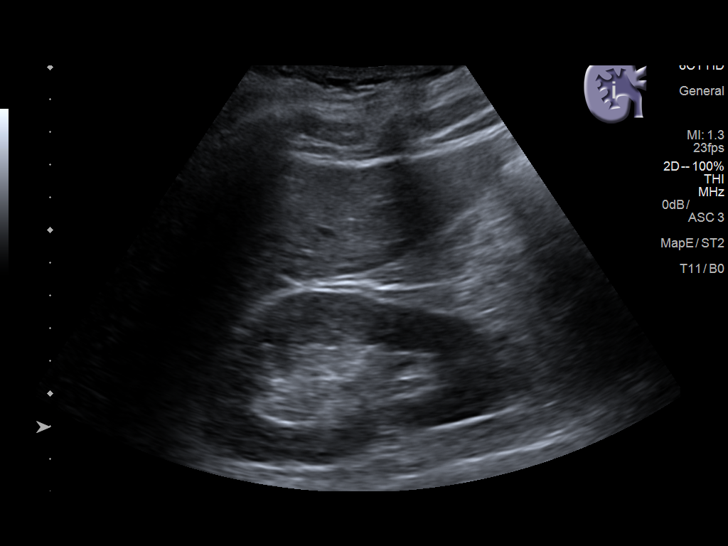
[im 51/77]
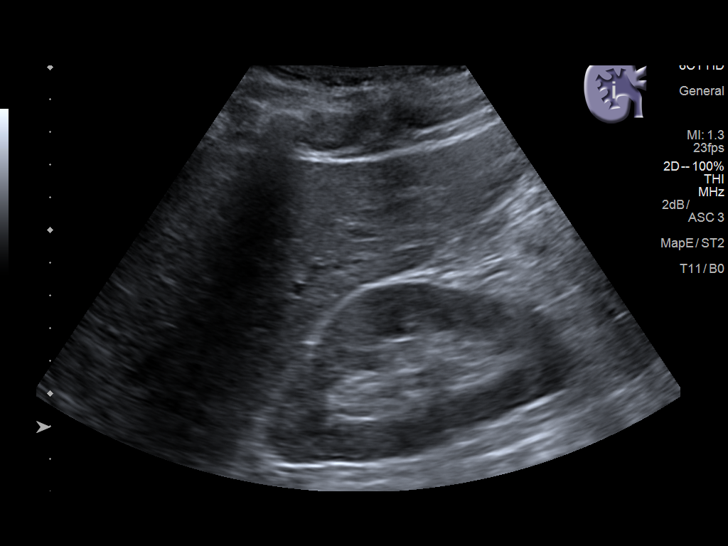
[im 58/77]
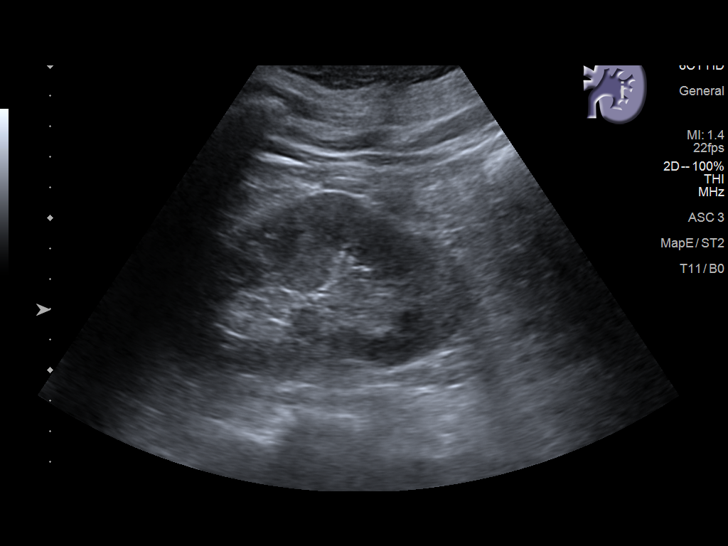
[im 64/77]
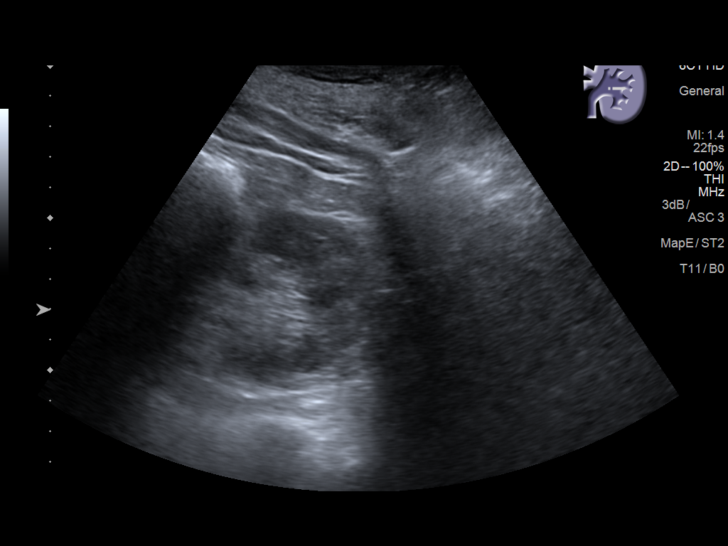
[im 70/77]
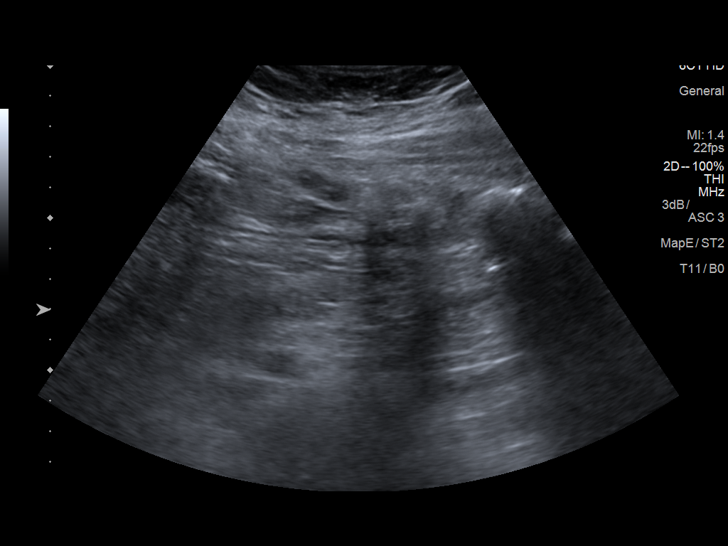
[im 77/77]
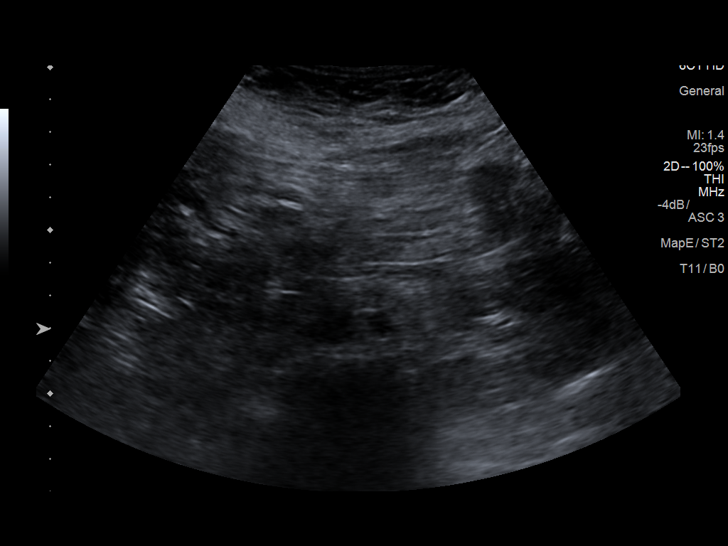

[14 of 25 positions shown; findings below may reference images not displayed]

FINDINGS: Gallbladder: No gallstones or wall thickening visualized. No
sonographic Murphy sign noted by sonographer.

Common bile duct: Diameter: 5 mm, within normal limits.

Liver: No focal lesion identified. Within normal limits in
parenchymal echogenicity.

IVC: No abnormality visualized.

Pancreas: Visualized portion unremarkable.

Spleen: 7.9 cm, within normal limits.

Right Kidney: Length: 10.7 cm. Parenchymal echogenicity is within
normal limits. No hydronephrosis.

Left Kidney: Length: 11.5 cm.. Parenchymal echogenicity is within
normal limits. No hydronephrosis. No focal lesion.

Abdominal aorta: No aneurysm visualized.

Other findings: No mass in the upper abdomen.
IMPRESSION: No findings to explain the patient's given symptoms.

## 2017-05-18 IMAGING — DX DG CHEST 2V
2 series · 2 of 2 positions shown · non-contrast
Comparison: Chest radiograph performed 07/25/2014

CLINICAL DATA: Acute onset of cough, shortness of breath and
wheezing. Leukocytosis. Initial encounter.

EXAM:
CHEST  2 VIEW

[chest pa]
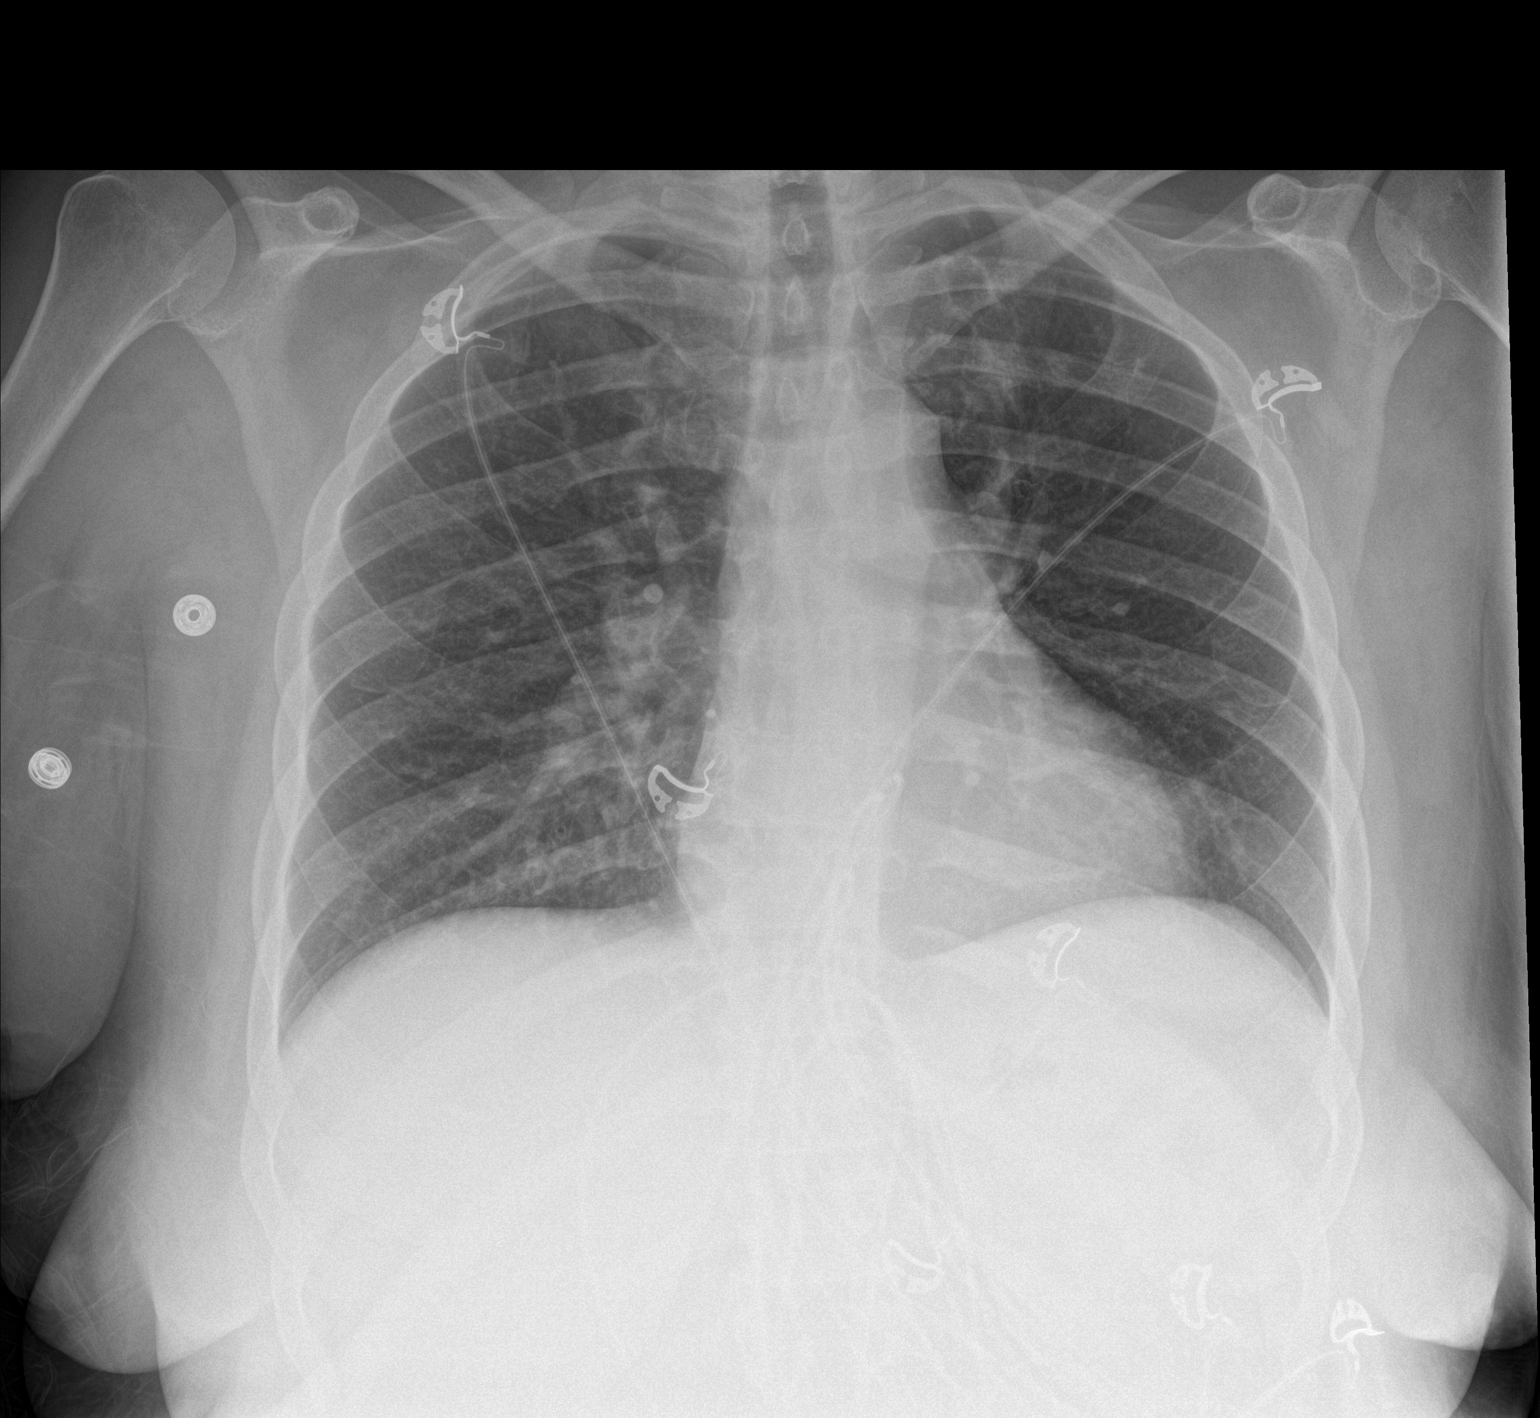

[chest lat]
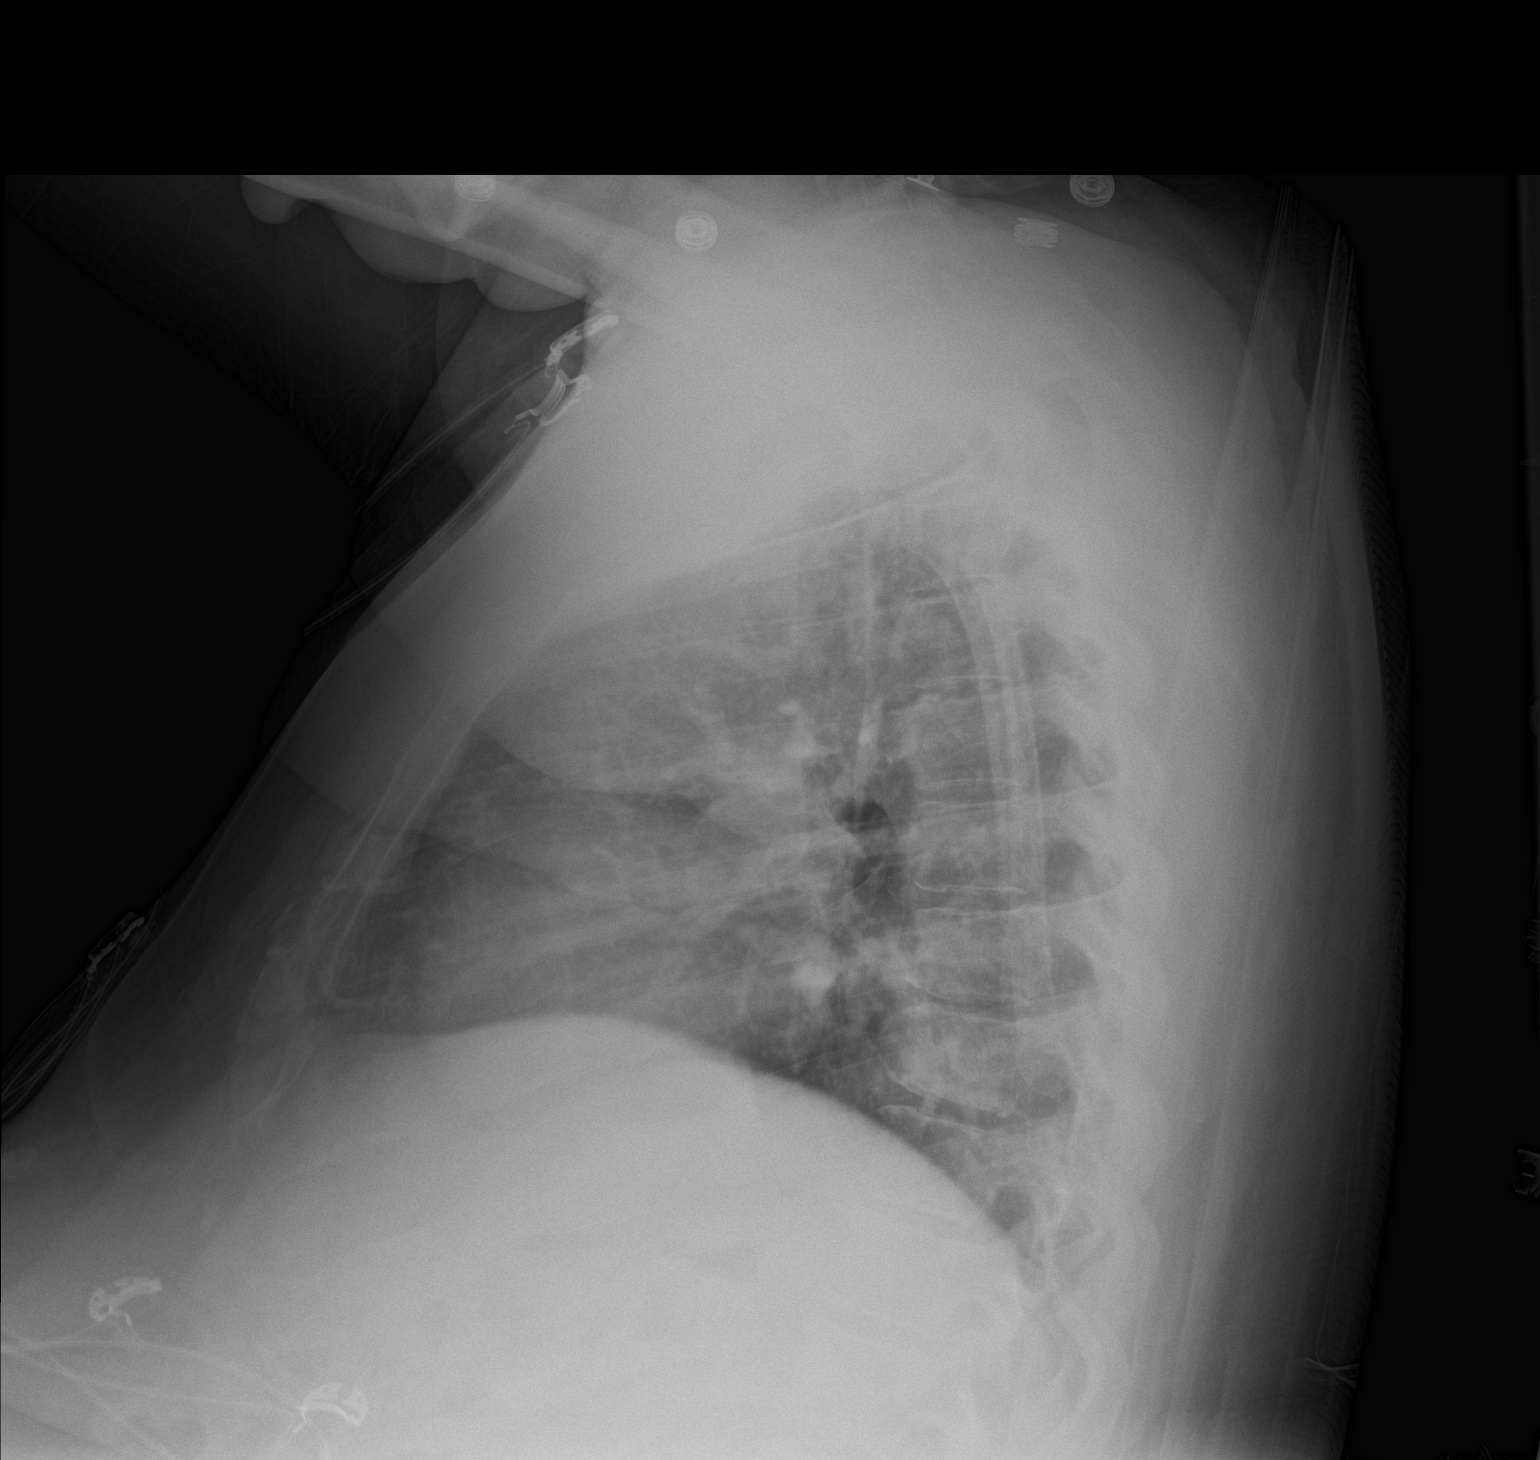

[2 of 2 positions shown; findings below may reference images not displayed]

FINDINGS: The lungs are well-aerated and clear. There is no evidence of focal
opacification, pleural effusion or pneumothorax.

The heart is normal in size; the mediastinal contour is within
normal limits. No acute osseous abnormalities are seen.
IMPRESSION: No acute cardiopulmonary process seen.

## 2017-06-21 ENCOUNTER — Other Ambulatory Visit: Payer: Self-pay

## 2017-06-21 ENCOUNTER — Emergency Department (HOSPITAL_COMMUNITY)
Admission: EM | Admit: 2017-06-21 | Discharge: 2017-06-22 | Disposition: A | Discharge status: Home / Self Care | Payer: Self-pay | Attending: Emergency Medicine | Admitting: Emergency Medicine

## 2017-06-21 ENCOUNTER — Encounter (HOSPITAL_COMMUNITY): Payer: Self-pay

## 2017-06-21 DIAGNOSIS — J45909 Unspecified asthma, uncomplicated: Secondary | ICD-10-CM | POA: Insufficient documentation

## 2017-06-21 DIAGNOSIS — R109 Unspecified abdominal pain: Secondary | ICD-10-CM

## 2017-06-21 DIAGNOSIS — E119 Type 2 diabetes mellitus without complications: Secondary | ICD-10-CM | POA: Insufficient documentation

## 2017-06-21 DIAGNOSIS — R1032 Left lower quadrant pain: Secondary | ICD-10-CM | POA: Insufficient documentation

## 2017-06-21 DIAGNOSIS — Z87891 Personal history of nicotine dependence: Secondary | ICD-10-CM | POA: Insufficient documentation

## 2017-06-21 DIAGNOSIS — Z794 Long term (current) use of insulin: Secondary | ICD-10-CM | POA: Insufficient documentation

## 2017-06-21 DIAGNOSIS — I10 Essential (primary) hypertension: Secondary | ICD-10-CM | POA: Insufficient documentation

## 2017-06-21 DIAGNOSIS — R112 Nausea with vomiting, unspecified: Secondary | ICD-10-CM | POA: Insufficient documentation

## 2017-06-21 DIAGNOSIS — Z79899 Other long term (current) drug therapy: Secondary | ICD-10-CM | POA: Insufficient documentation

## 2017-06-21 LAB — CBC
HCT: 36.6 % (ref 36.0–46.0)
Hemoglobin: 12.7 g/dL (ref 12.0–15.0)
MCH: 25.9 pg — ABNORMAL LOW (ref 26.0–34.0)
MCHC: 34.7 g/dL (ref 30.0–36.0)
MCV: 74.7 fL — ABNORMAL LOW (ref 78.0–100.0)
PLATELETS: 389 10*3/uL (ref 150–400)
RBC: 4.9 MIL/uL (ref 3.87–5.11)
RDW: 16.2 % — AB (ref 11.5–15.5)
WBC: 15.2 10*3/uL — AB (ref 4.0–10.5)

## 2017-06-21 LAB — COMPREHENSIVE METABOLIC PANEL
ALK PHOS: 65 U/L (ref 38–126)
ALT: 10 U/L — AB (ref 14–54)
AST: 23 U/L (ref 15–41)
Albumin: 3.4 g/dL — ABNORMAL LOW (ref 3.5–5.0)
Anion gap: 15 (ref 5–15)
BILIRUBIN TOTAL: 1.1 mg/dL (ref 0.3–1.2)
BUN: 13 mg/dL (ref 6–20)
CO2: 20 mmol/L — ABNORMAL LOW (ref 22–32)
CREATININE: 1.13 mg/dL — AB (ref 0.44–1.00)
Calcium: 9.3 mg/dL (ref 8.9–10.3)
Chloride: 103 mmol/L (ref 101–111)
GFR calc Af Amer: >60 mL/min (ref >60)
GFR, EST NON AFRICAN AMERICAN: 59 mL/min — AB (ref >60)
Glucose, Bld: 208 mg/dL — ABNORMAL HIGH (ref 65–99)
Potassium: 3.8 mmol/L (ref 3.5–5.1)
Sodium: 138 mmol/L (ref 135–145)
TOTAL PROTEIN: 7.7 g/dL (ref 6.5–8.1)

## 2017-06-21 LAB — LIPASE, BLOOD: Lipase: 23 U/L (ref 11–51)

## 2017-06-21 LAB — I-STAT BETA HCG BLOOD, ED (MC, WL, AP ONLY): I-stat hCG, quantitative: <5 m[IU]/mL (ref <5)

## 2017-06-21 MED ORDER — SODIUM CHLORIDE 0.9 % IV SOLN
INTRAVENOUS | Status: DC
  Administered 2017-06-21: 150 mL/h via INTRAVENOUS

## 2017-06-21 MED ORDER — SODIUM CHLORIDE 0.9 % IV BOLUS (SEPSIS)
1000.0000 mL | Freq: Once | INTRAVENOUS | Status: AC
  Administered 2017-06-21: 1000 mL via INTRAVENOUS

## 2017-06-21 MED ORDER — ONDANSETRON 4 MG PO TBDP
4.0000 mg | ORAL_TABLET | Freq: Once | ORAL | Status: AC | PRN
  Administered 2017-06-21: 4 mg via ORAL
  Filled 2017-06-21: qty 1

## 2017-06-21 MED ORDER — HALOPERIDOL LACTATE 5 MG/ML IJ SOLN
2.0000 mg | Freq: Once | INTRAMUSCULAR | Status: AC
  Administered 2017-06-21: 2 mg via INTRAVENOUS
  Filled 2017-06-21: qty 1

## 2017-06-21 MED ORDER — PROMETHAZINE HCL 25 MG/ML IJ SOLN
25.0000 mg | Freq: Once | INTRAMUSCULAR | Status: AC
  Administered 2017-06-21: 25 mg via INTRAVENOUS
  Filled 2017-06-21: qty 1

## 2017-06-21 MED ORDER — FENTANYL CITRATE (PF) 100 MCG/2ML IJ SOLN
100.0000 ug | Freq: Once | INTRAMUSCULAR | Status: AC
  Administered 2017-06-21: 100 ug via INTRAVENOUS
  Filled 2017-06-21: qty 2

## 2017-06-21 NOTE — ED Provider Notes (Signed)
[facility] EMERGENCY DEPARTMENT Provider Note   CSN: [id number] Arrival date & time: 06/21/17  2201     History   Chief Complaint Chief Complaint  Patient presents with  . Abdominal Pain    HPI Shirley [name] is a 42 y.o. female.  The history is provided by the patient.  She has a history of hypertension, diabetes, hyperlipidemia, gastroparesis, GERD and comes in with onset yesterday of left lower quadrant pain, nausea, vomiting.  She is vomited multiple times.  Pain is severe and she rates it a 10/10.  It does not radiate anywhere.  Nothing makes it better, nothing makes it worse.  She has had a bowel movement which did not affect her pain.  She denies fever or chills.  Symptoms are typical of exacerbations of her gastroparesis.  She states she has not taken anything for pain or nausea at home.  Past Medical History:  Diagnosis Date  . Abscess of tunica vaginalis    10/09- Abundant S. aureus- sensitive to all abx  . Anxiety   . Blood dyscrasia   . CAD (coronary artery disease) 06/15/2006   s/p Subendocardial MI with PDA angioplasty(no stent) on 06/15/06 and relook  cath 06/19/06 showed patency of site. Cath 12/10- no restenosis or significant CAD progression  . CVA (cerebral vascular accident) ([place]) ~ 02/2014   denies residual on 04/22/2014  . CVA (cerebral vascular accident) [facility])    history of remote right cerebellar infarct noted on head CT at least since 10/2011  . Depression   . Diabetes mellitus type 2, uncontrolled, with complications ([place])   . Fibromyalgia   . Gastritis   . Gastroparesis    secondary to poorly controlled DM, last emptying study performed 01/2010  was normal but may be falsely positive as pt was on reglan  . GERD (gastroesophageal reflux disease)   . Hepatitis B, chronic (HCC)    Hep BeAb+,Hep B cAb+ & Hep BsAg+ (9/06)  . History of pyelonephritis    H/o GrpB Pyelonephritis (9/06) and UTI- 07/11- E.Coli, 12/10- GBS  .  Hyperlipidemia   . Hypertension   . Iron deficiency anemia   . Irregular menses    Small ovarian follicles seen on OV(2/91)  . MI (myocardial infarction) ([place]) 05/2006   PDA percutaneous transluminal coronary angioplasty  . Migraine    "weekly" (04/22/2014)  . N&V (nausea and vomiting)    Chronic. Unclear etiology with multiple admission and ED visits. CT abdomen with and without contrast (02/2011)  showed no acute process. Gastic Emptying scan (01/2010) was normal. Ultrasound of the abdomen was within normal limits. Hepatitis B viral load was undectable. HIV NR. EGD - gastritis, Hpylori + s/p Rx  . Obesity   . OSA (obstructive sleep apnea)    "suppose to wear mask but I don't" (04/22/2014)  . Peripheral neuropathy   . Pneumonia    "this is probably the 2nd or 3rd time I've had pneumonia" (04/22/2014)  . Recurrent boils   . Seasonal asthma   . Substance abuse ([place])   . Thrombocytosis ([place])    Hem/Onc suggested 2/2 chronic hepatits and/or iron deficiency anemia    Patient Active Problem List   Diagnosis Date Noted  . Chronic maxillary sinusitis 01/02/2017  . Acute blood loss anemia 11/13/2016  . Menorrhagia 11/13/2016  . Bilateral pulmonary embolism ([place]) 10/30/2016  . Acute DVT (deep venous thrombosis) ([employer]) 10/30/2016  . Type 2 diabetes mellitus with hyperglycemia ([place]) 04/07/2016  . Diabetic gastroparesis (  [place]) 04/06/2016  . Dehydration 03/27/2015  . Gastroparesis 11/11/2014  . Hematemesis 10/13/2014  . DKA (diabetic ketoacidoses) ([place]) 10/13/2014  . Tachycardia 07/21/2014  . Abdominal pain, chronic, left lower quadrant   . Diabetic gastroparesis associated with type 2 diabetes mellitus ([place]) 04/28/2014  . History of Helicobacter pylori infection 04/22/2014  . Vaginal discharge 02/18/2014  . Atypical chest pain 07/22/2013  . Unspecified constipation 07/21/2013  . Tinea corporis 07/21/2013  . Intractable vomiting 04/29/2013  . Dysmenorrhea 04/22/2013  . UTI (urinary  tract infection) 07/15/2012  . Headache(784.0) 02/08/2012  . Health care maintenance 01/22/2012  . Chronic hepatitis B ([place]) 03/07/2011  . History of leukocytosis 04/06/2010  . THROMBOCYTOSIS 04/06/2010  . Polysubstance abuse ([place]) 02/23/2010  . Iron deficiency anemia 11/22/2009  . PERIPHERAL NEUROPATHY 10/01/2009  . Hyperlipidemia 08/30/2009  . Diabetic polyneuropathy ([place]) 08/30/2009  . Hidradenitis (recurrent boils) 07/07/2008  . Depression 12/27/2007  . Abdominal pain, left lower quadrant 11/21/2007  . FIBROMYALGIA 10/30/2007  . BACK PAIN 04/01/2007  . OBSTRUCTIVE SLEEP APNEA 01/17/2007  . ANXIETY DEPRESSION 06/27/2006  . Chronic ischemic heart disease 06/15/2006  . OBESITY, MORBID 05/15/2006  . MIGRAINE HEADACHE 05/15/2006  . Asthma 05/15/2006  . Essential hypertension 01/16/2006  . IRREGULAR MENSTRUATION 01/16/2006  . PEDAL EDEMA 01/16/2006  . Poorly controlled type 2 diabetes mellitus with peripheral neuropathy ([place]) 01/16/1989    Past Surgical History:  Procedure Laterality Date  . CESAREAN SECTION  1997  . CORONARY ANGIOPLASTY WITH STENT PLACEMENT  2008   "2 stents"  . ESOPHAGOGASTRODUODENOSCOPY N/A 04/23/2014   Procedure: ESOPHAGOGASTRODUODENOSCOPY (EGD);  Surgeon: Winfield Cunas., MD;  Location: [facility] ENDOSCOPY;  Service: Endoscopy;  Laterality: N/A;  . IR ANGIOGRAM PELVIS SELECTIVE OR SUPRASELECTIVE  12/07/2016  . IR ANGIOGRAM PELVIS SELECTIVE OR SUPRASELECTIVE  12/07/2016  . IR ANGIOGRAM SELECTIVE EACH ADDITIONAL VESSEL  12/07/2016  . IR ANGIOGRAM SELECTIVE EACH ADDITIONAL VESSEL  12/07/2016  . IR EMBO ARTERIAL NOT HEMORR HEMANG INC GUIDE ROADMAPPING  12/07/2016  . IR RADIOLOGIST EVAL & MGMT  01/09/2017  . IR US GUIDE VASC ACCESS RIGHT  12/07/2016    OB History    Gravida Para Term Preterm AB Living   '2 2 2         '$ SAB TAB Ectopic Multiple Live Births           2       Home Medications    Prior to Admission medications   Medication Sig Start Date End  Date Taking? Authorizing Provider  albuterol (PROVENTIL HFA;VENTOLIN HFA) 108 (90 Base) MCG/ACT inhaler Inhale 2 puffs into the lungs every 4 (four) hours as needed for wheezing or shortness of breath. 09/27/16   Clent Demark, PA-C  apixaban (ELIQUIS) 5 MG TABS tablet Take 1 tablet (5 mg total) by mouth 2 (two) times daily. 11/18/16   Rice, Resa Miner, MD  cyclobenzaprine (FLEXERIL) 10 MG tablet Take 1 tablet (10 mg total) by mouth 2 (two) times daily. 02/07/17   Clent Demark, PA-C  erythromycin (E-MYCIN) 250 MG tablet Take 1 tablet (250 mg total) by mouth 4 (four) times daily. 02/07/17   Clent Demark, PA-C  glucose blood (FREESTYLE TEST STRIPS) test strip Use as instructed 09/05/16   Clent Demark, PA-C  glucose monitoring kit (FREESTYLE) monitoring kit 1 each by Does not apply route 4 (four) times daily - after meals and at bedtime. 1 month Diabetic Testing Supplies for QAC-QHS accuchecks. 09/05/16   Clent Demark, PA-C  hydrochlorothiazide (HYDRODIURIL) 25 MG tablet Take 1 tablet (25 mg total) by mouth daily. Take on tablet in the morning. 02/07/17   Clent Demark, PA-C  insulin NPH-regular Human (NOVOLIN 70/30) (70-30) 100 UNIT/ML injection 50 units in the morning and 50 units in the evening. 02/07/17   Clent Demark, PA-C  INSULIN SYRINGE .5CC/28G (INS SYRINGE/NEEDLE .5CC/28G) 28G X 1/2" 0.5 ML MISC Please provide 1 month supply 01/02/17   Clent Demark, PA-C  Lancets (FREESTYLE) lancets Use as instructed 01/02/17   Clent Demark, PA-C  losartan (COZAAR) 50 MG tablet Take 1 tablet (50 mg total) by mouth daily. 02/07/17   Clent Demark, PA-C  metoprolol tartrate (LOPRESSOR) 100 MG tablet Take 1 tablet (100 mg total) by mouth 2 (two) times daily. 02/07/17   Clent Demark, PA-C  promethazine (PHENERGAN) 25 MG suppository Place 1 suppository (25 mg total) rectally every 6 (six) hours as needed for nausea or vomiting. 29/51/88   Delora Fuel, MD    promethazine (PHENERGAN) 25 MG tablet Take 1 tablet (25 mg total) by mouth every 6 (six) hours as needed for nausea or vomiting. 41/66/06   Delora Fuel, MD    Family History Family History  Problem Relation Age of Onset  . Diabetes Father     Social History Social History   Tobacco Use  . Smoking status: Former Smoker    Types: Cigarettes    Last attempt to quit: 04/24/1996    Years since quitting: 21.1  . Smokeless tobacco: Never Used  . Tobacco comment: quit smoking cigarettes age 43  Substance Use Topics  . Alcohol use: No    Alcohol/week: 0.0 oz    Comment: 04/22/2014 "might have a few drinks a month"  . Drug use: No    Comment: 04/22/2104 "quit drugs ~ 1-2 yr ago"     Allergies   Lisinopril and Morphine and related   Review of Systems Review of Systems  All other systems reviewed and are negative.    Physical Exam Updated Vital Signs BP (!) 161/91 (BP Location: Left Arm)   Pulse (!) 125   Temp 99 F (37.2 C) (Oral)   Resp (!) 31   Ht '5\' 7"'$  (1.702 m)   Wt 90.7 kg (200 lb)   SpO2 98%   BMI 31.32 kg/m   Physical Exam  Nursing note and vitals reviewed.  42 year old female, rocking back and forth on the bed-apparently in pain, but in no acute distress. Vital signs are significant for elevated heart rate, respiratory rate and blood pressure. Oxygen saturation is 98%, which is normal. Head is normocephalic and atraumatic. PERRLA, EOMI. Oropharynx is clear. Neck is nontender and supple without adenopathy or JVD. Back is nontender and there is no CVA tenderness. Lungs are clear without rales, wheezes, or rhonchi. Chest is nontender. Heart has regular rate and rhythm without murmur. Abdomen is soft, flat, with moderate tenderness in the left lower abdomen.  There is no rebound or guarding.  There are no masses or hepatosplenomegaly and peristalsis is hypoactive. Extremities have no cyanosis or edema, full range of motion is present. Skin is warm and dry  without rash. Neurologic: Mental status is normal, cranial nerves are intact, there are no motor or sensory deficits.  ED Treatments / Results  Labs (all labs ordered are listed, but only abnormal results are displayed) Labs Reviewed  COMPREHENSIVE METABOLIC PANEL - Abnormal; Notable for the following components:      Result Value  CO2 20 (*)    Glucose, Bld 208 (*)    Creatinine, Ser 1.13 (*)    Albumin 3.4 (*)    ALT 10 (*)    GFR calc non Af Amer 59 (*)    All other components within normal limits  CBC - Abnormal; Notable for the following components:   WBC 15.2 (*)    MCV 74.7 (*)    MCH 25.9 (*)    RDW 16.2 (*)    All other components within normal limits  URINALYSIS, ROUTINE W REFLEX MICROSCOPIC - Abnormal; Notable for the following components:   Color, Urine AMBER (*)    APPearance HAZY (*)    Specific Gravity, Urine 1.031 (*)    Glucose, UA >=500 (*)    Ketones, ur 20 (*)    Protein, ur >=300 (*)    Squamous Epithelial / LPF 6-30 (*)    All other components within normal limits  DIFFERENTIAL - Abnormal; Notable for the following components:   Neutro Abs 9.6 (*)    All other components within normal limits  LIPASE, BLOOD  MAGNESIUM  I-STAT BETA HCG BLOOD, ED ([facility], [facility], [facility] ONLY)  CBG MONITORING, ED    EKG  EKG Interpretation  Date/Time:  Thursday June 21 2017 23:13:16 EST Ventricular Rate:  132 PR Interval:    QRS Duration: 76 QT Interval:  392 QTC Calculation: 586 R Axis:   75 Text Interpretation:  Sinus tachycardia Probable left atrial enlargement Prolonged QT interval Nonspecific ST and T wave abnormality When compared with ECG of 02/08/2017, QT has lengthened Confirmed by Delora Fuel ([id number]) on 06/21/2017 11:35:17 PM Also confirmed by Delora Fuel ([id number]), editor Hattie Perch [phone])  on 06/22/2017 7:15:06 AM      Procedures Procedures  Medications Ordered in ED Medications  0.9 %  sodium chloride infusion (150 mL/hr Intravenous New  Bag/Given 06/21/17 2323)  ondansetron (ZOFRAN-ODT) disintegrating tablet 4 mg (4 mg Oral Given 06/21/17 2314)  sodium chloride 0.9 % bolus 1,000 mL (0 mLs Intravenous Stopped 06/22/17 0156)  fentaNYL (SUBLIMAZE) injection 100 mcg (100 mcg Intravenous Given 06/21/17 2313)  promethazine (PHENERGAN) injection 25 mg (25 mg Intravenous Given 06/21/17 2323)  haloperidol lactate (HALDOL) injection 2 mg (2 mg Intravenous Given 06/21/17 2313)  magnesium sulfate IVPB 2 g 50 mL (0 g Intravenous Stopped 06/22/17 0326)     Initial Impression / Assessment and Plan / ED Course  I have reviewed the triage vital signs and the nursing notes.  Pertinent lab results that were available during my care of the patient were reviewed by me and considered in my medical decision making (see chart for details).  Abdominal pain with nausea and vomiting consistent with exacerbation of gastroparesis.  No red flags to suggest more serious pathology.  Old records are reviewed showing numerous ED visits and hospitalizations for same.  She is given IV fluids, promethazine, fentanyl, haloperidol.  Following above-noted treatment, she had no further nausea and slept through the night.  She no longer seems to be in pain.  She was observed for 5 hours with no deterioration was felt to be safe for discharge.  Laboratory workup is reassuring.  She is discharged with instructions to follow-up with PCP.  Return precautions discussed.  Final Clinical Impressions(s) / ED Diagnoses   Final diagnoses:  Non-intractable vomiting with nausea, unspecified vomiting type  Abdominal pain, unspecified abdominal location    ED Discharge Orders    None       Delora Fuel, MD  06/22/17 0812  

## 2017-06-21 NOTE — ED Triage Notes (Signed)
Pt arrives to ED from home with complaints of abdominal pain since last night, worsening today. EMS reports pt has hx of gastroparesis. Pt placed in position of comfort with bed locked and lowered, call bell in reach.

## 2017-06-22 LAB — URINALYSIS, ROUTINE W REFLEX MICROSCOPIC
BACTERIA UA: NONE SEEN
Bilirubin Urine: NEGATIVE
Glucose, UA: >=500 mg/dL — AB
Hgb urine dipstick: NEGATIVE
KETONES UR: 20 mg/dL — AB
Leukocytes, UA: NEGATIVE
Nitrite: NEGATIVE
PH: 5 (ref 5.0–8.0)
Specific Gravity, Urine: 1.031 — ABNORMAL HIGH (ref 1.005–1.030)

## 2017-06-22 LAB — MAGNESIUM: Magnesium: 1.7 mg/dL (ref 1.7–2.4)

## 2017-06-22 LAB — DIFFERENTIAL
BASOS ABS: 0 10*3/uL (ref 0.0–0.1)
BASOS PCT: 0 %
Eosinophils Absolute: 0.6 10*3/uL (ref 0.0–0.7)
Eosinophils Relative: 4 %
Lymphocytes Relative: 19 %
Lymphs Abs: 2.6 10*3/uL (ref 0.7–4.0)
Monocytes Absolute: 0.8 10*3/uL (ref 0.1–1.0)
Monocytes Relative: 6 %
NEUTROS ABS: 9.6 10*3/uL — AB (ref 1.7–7.7)
Neutrophils Relative %: 71 %

## 2017-06-22 MED ORDER — ONDANSETRON HCL 4 MG PO TABS: 4.0000 mg | ORAL_TABLET | Freq: Four times a day (QID) | ORAL | 0 refills | Status: DC | PRN

## 2017-06-22 MED ORDER — MAGNESIUM SULFATE 2 GM/50ML IV SOLN
2.0000 g | Freq: Once | INTRAVENOUS | Status: AC
  Administered 2017-06-22: 2 g via INTRAVENOUS
  Filled 2017-06-22: qty 50

## 2017-06-22 NOTE — Discharge Instructions (Signed)
Continue your current medications.

## 2017-07-09 IMAGING — US US RENAL
1 series · 14 of 25 positions shown · non-contrast
Comparison: Prior CT from 11/27/2015.

CLINICAL DATA: Initial evaluation for acute left flank pain,
elevated BUN/ creatinine.

EXAM:
RENAL / URINARY TRACT ULTRASOUND COMPLETE

[Series 1: us renal · 0.18mm/px · 14 of 29 slices shown]
[im 1/29]
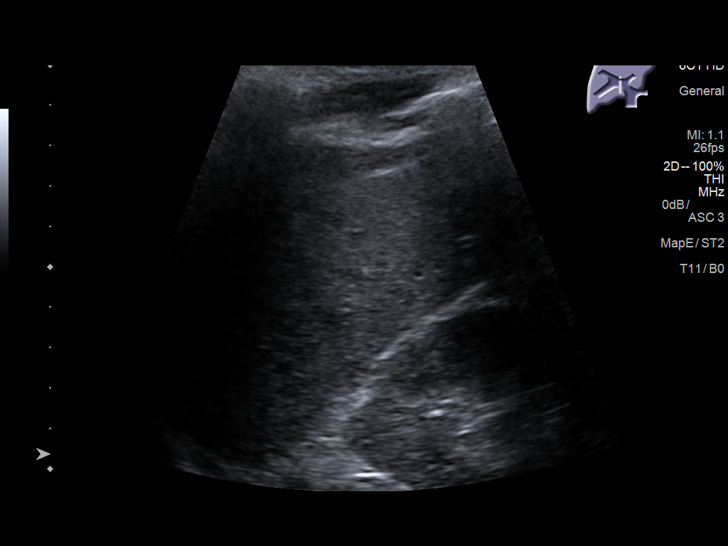
[im 3/29]
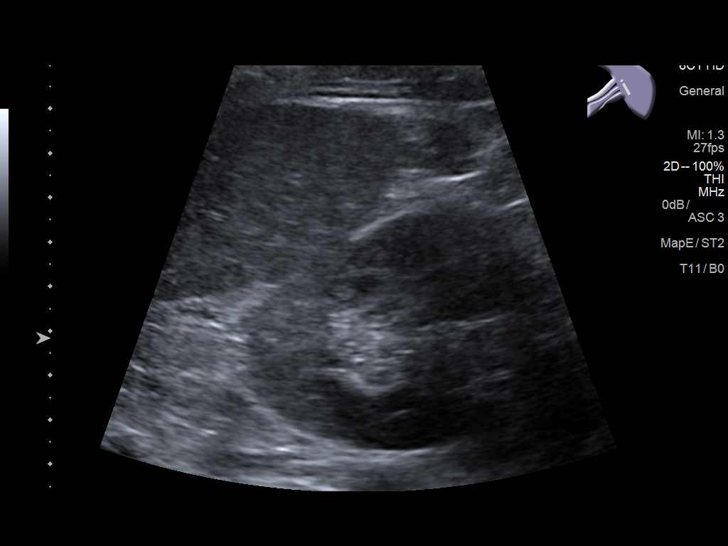
[im 5/29]
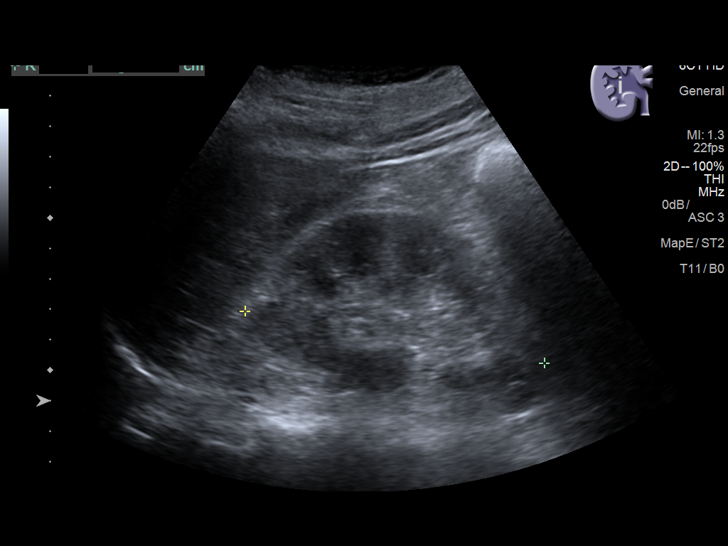
[im 8/29]
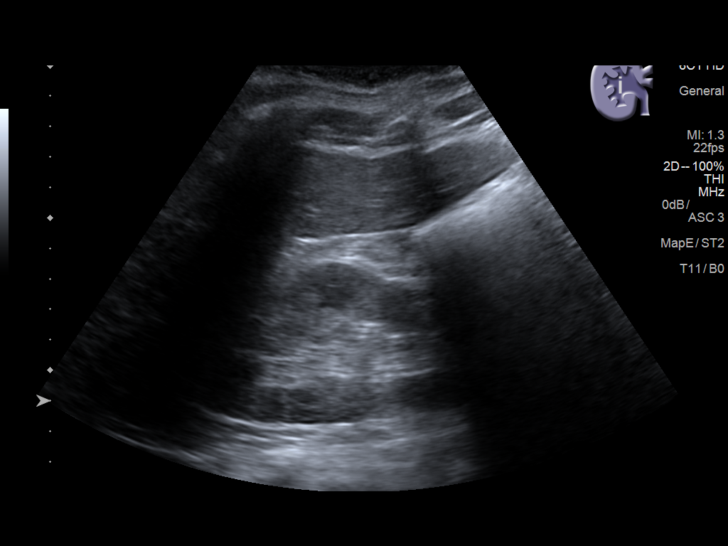
[im 10/29]
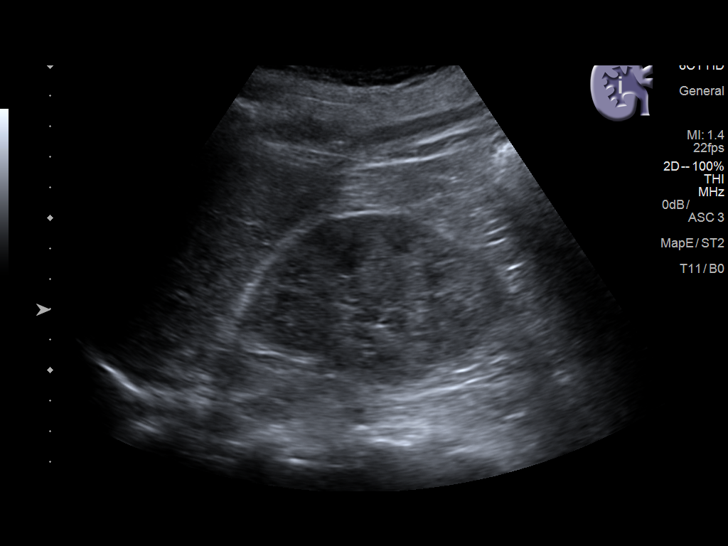
[im 11/29]
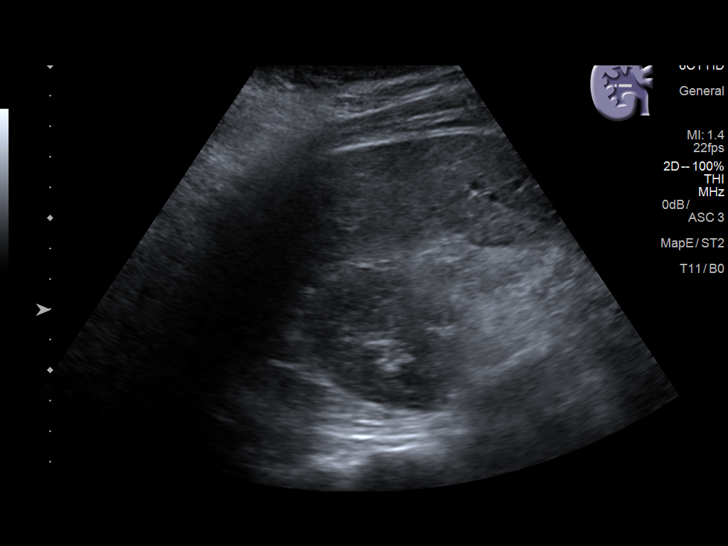
[im 13/29]
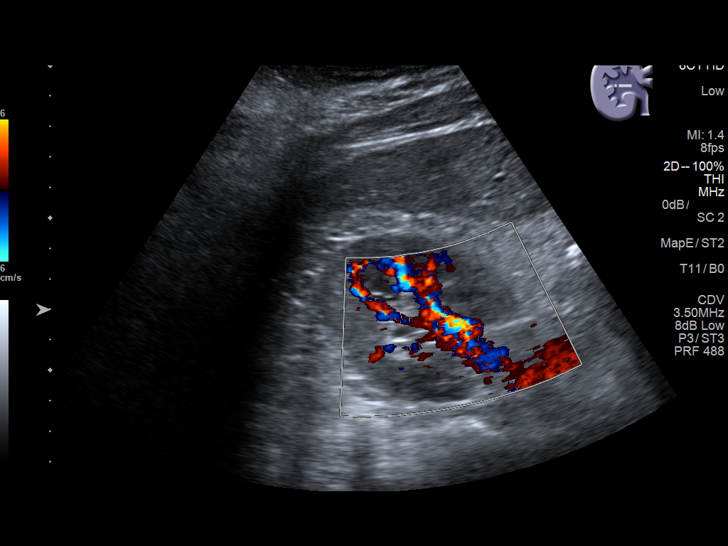
[im 16/29]
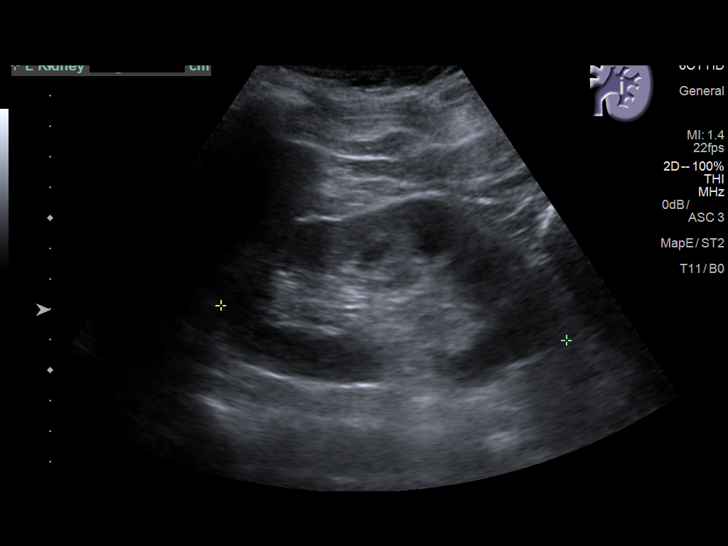
[im 18/29]
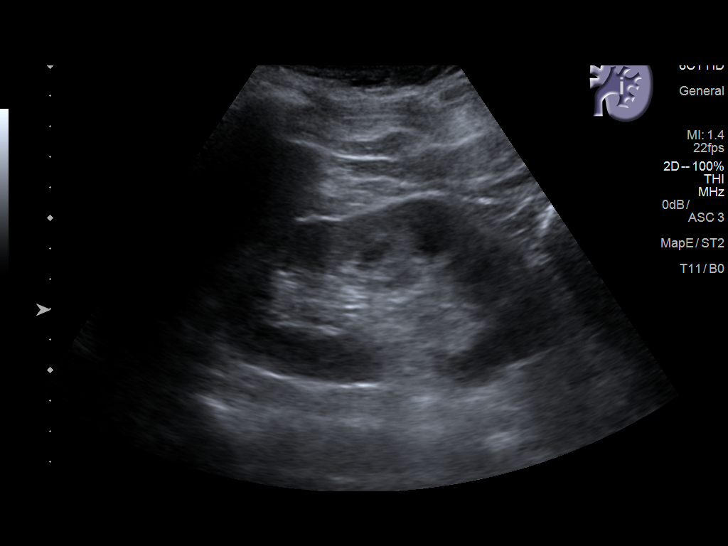
[im 19/29]
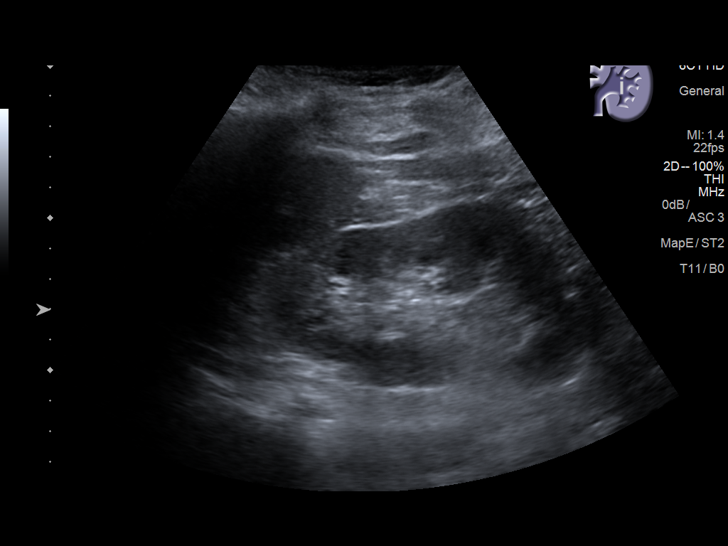
[im 22/29]
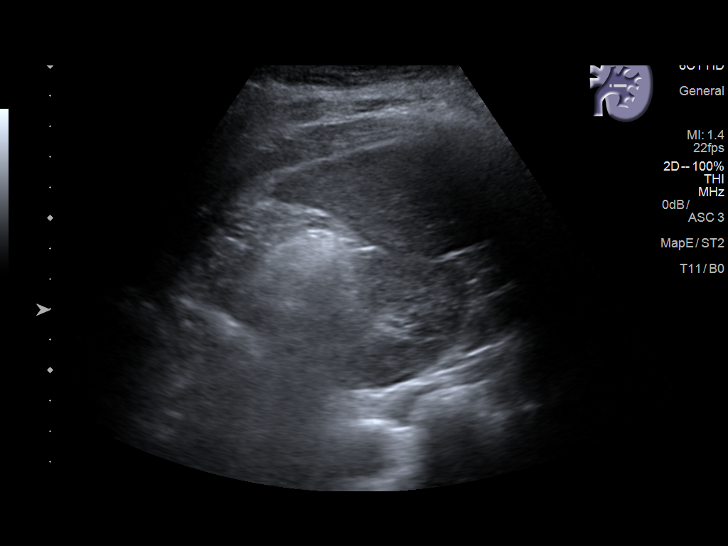
[im 24/29]
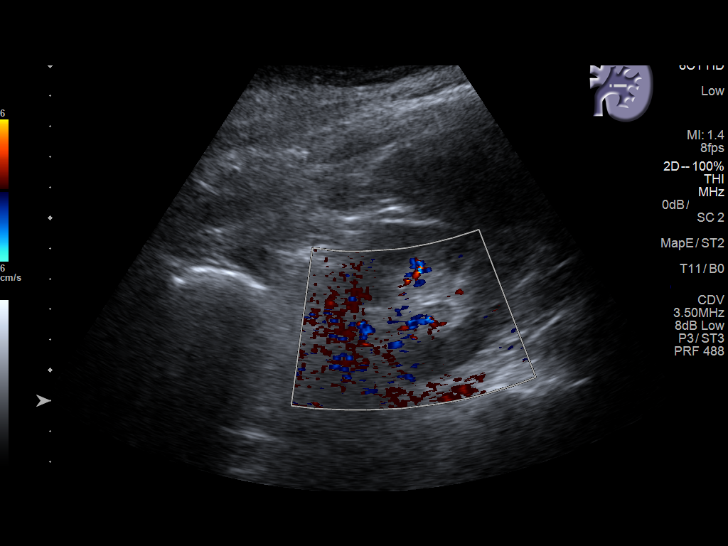
[im 26/29]
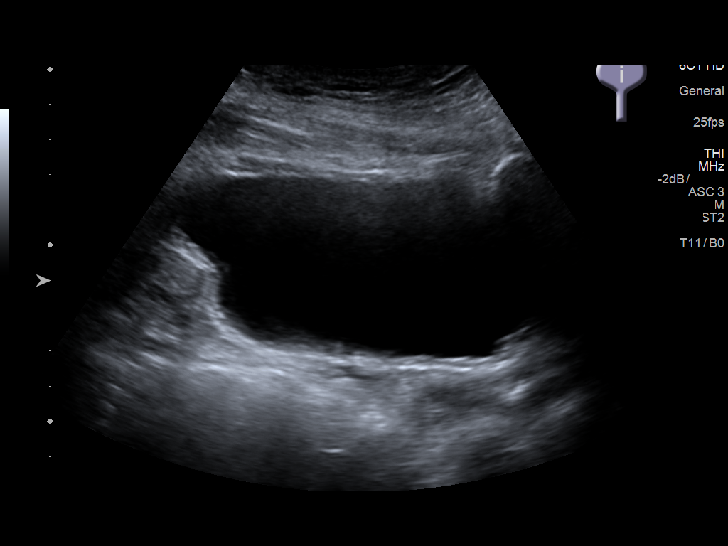
[im 29/29]
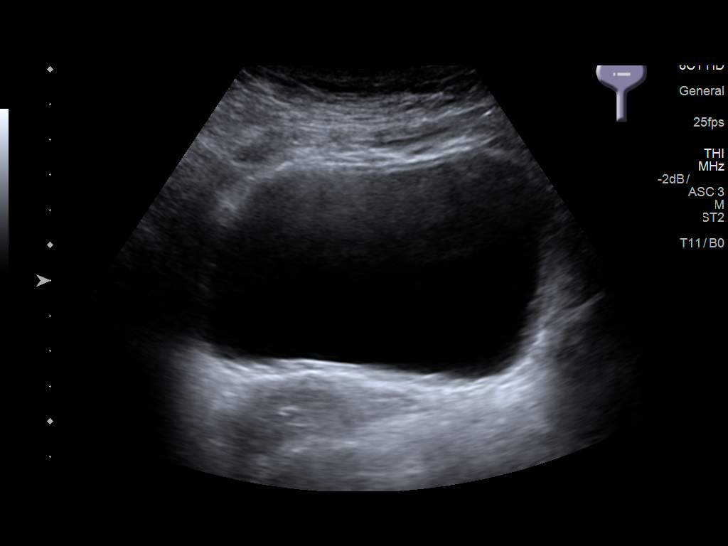

[14 of 25 positions shown; findings below may reference images not displayed]

FINDINGS: Right Kidney:

Length: 10.0 cm. Echogenicity within normal limits. No mass or
hydronephrosis visualized. No sonographic evidence for
nephrolithiasis.

Left Kidney:

Length: 11.4 cm. Echogenicity within normal limits. No mass or
hydronephrosis visualized. No sonographic evidence for
nephrolithiasis.

Bladder:

Appears normal for degree of bladder distention.
IMPRESSION: Normal renal ultrasound. No hydronephrosis or other acute
abnormality identified. No sonographic evidence for nephrolithiasis.

## 2017-08-08 ENCOUNTER — Inpatient Hospital Stay (HOSPITAL_COMMUNITY)
Admission: EM | Admit: 2017-08-08 | Discharge: 2017-08-13 | DRG: 074 | Disposition: A | Discharge status: Home / Self Care | Payer: Medicaid Other | Attending: Internal Medicine | Admitting: Internal Medicine

## 2017-08-08 ENCOUNTER — Encounter (HOSPITAL_COMMUNITY): Payer: Self-pay | Admitting: Emergency Medicine

## 2017-08-08 ENCOUNTER — Other Ambulatory Visit: Payer: Self-pay

## 2017-08-08 DIAGNOSIS — Z86711 Personal history of pulmonary embolism: Secondary | ICD-10-CM

## 2017-08-08 DIAGNOSIS — Z8742 Personal history of other diseases of the female genital tract: Secondary | ICD-10-CM

## 2017-08-08 DIAGNOSIS — E1143 Type 2 diabetes mellitus with diabetic autonomic (poly)neuropathy: Principal | ICD-10-CM

## 2017-08-08 DIAGNOSIS — F1490 Cocaine use, unspecified, uncomplicated: Secondary | ICD-10-CM | POA: Diagnosis present

## 2017-08-08 DIAGNOSIS — E860 Dehydration: Secondary | ICD-10-CM

## 2017-08-08 DIAGNOSIS — D649 Anemia, unspecified: Secondary | ICD-10-CM

## 2017-08-08 DIAGNOSIS — Z79899 Other long term (current) drug therapy: Secondary | ICD-10-CM

## 2017-08-08 DIAGNOSIS — N179 Acute kidney failure, unspecified: Secondary | ICD-10-CM

## 2017-08-08 DIAGNOSIS — Z7901 Long term (current) use of anticoagulants: Secondary | ICD-10-CM

## 2017-08-08 DIAGNOSIS — E1142 Type 2 diabetes mellitus with diabetic polyneuropathy: Secondary | ICD-10-CM | POA: Diagnosis present

## 2017-08-08 DIAGNOSIS — B181 Chronic viral hepatitis B without delta-agent: Secondary | ICD-10-CM | POA: Diagnosis present

## 2017-08-08 DIAGNOSIS — E1169 Type 2 diabetes mellitus with other specified complication: Secondary | ICD-10-CM

## 2017-08-08 DIAGNOSIS — Z794 Long term (current) use of insulin: Secondary | ICD-10-CM

## 2017-08-08 DIAGNOSIS — Z86718 Personal history of other venous thrombosis and embolism: Secondary | ICD-10-CM

## 2017-08-08 DIAGNOSIS — M797 Fibromyalgia: Secondary | ICD-10-CM

## 2017-08-08 DIAGNOSIS — Z6833 Body mass index (BMI) 33.0-33.9, adult: Secondary | ICD-10-CM

## 2017-08-08 DIAGNOSIS — Z8673 Personal history of transient ischemic attack (TIA), and cerebral infarction without residual deficits: Secondary | ICD-10-CM

## 2017-08-08 DIAGNOSIS — Z87891 Personal history of nicotine dependence: Secondary | ICD-10-CM

## 2017-08-08 DIAGNOSIS — E1165 Type 2 diabetes mellitus with hyperglycemia: Secondary | ICD-10-CM | POA: Diagnosis present

## 2017-08-08 DIAGNOSIS — I252 Old myocardial infarction: Secondary | ICD-10-CM

## 2017-08-08 DIAGNOSIS — K219 Gastro-esophageal reflux disease without esophagitis: Secondary | ICD-10-CM | POA: Diagnosis present

## 2017-08-08 DIAGNOSIS — K3184 Gastroparesis: Secondary | ICD-10-CM

## 2017-08-08 DIAGNOSIS — Z833 Family history of diabetes mellitus: Secondary | ICD-10-CM

## 2017-08-08 DIAGNOSIS — I2510 Atherosclerotic heart disease of native coronary artery without angina pectoris: Secondary | ICD-10-CM

## 2017-08-08 DIAGNOSIS — Z888 Allergy status to other drugs, medicaments and biological substances status: Secondary | ICD-10-CM

## 2017-08-08 DIAGNOSIS — E119 Type 2 diabetes mellitus without complications: Secondary | ICD-10-CM | POA: Diagnosis present

## 2017-08-08 DIAGNOSIS — I10 Essential (primary) hypertension: Secondary | ICD-10-CM

## 2017-08-08 DIAGNOSIS — F329 Major depressive disorder, single episode, unspecified: Secondary | ICD-10-CM | POA: Diagnosis present

## 2017-08-08 DIAGNOSIS — F419 Anxiety disorder, unspecified: Secondary | ICD-10-CM | POA: Diagnosis present

## 2017-08-08 DIAGNOSIS — G4733 Obstructive sleep apnea (adult) (pediatric): Secondary | ICD-10-CM | POA: Diagnosis present

## 2017-08-08 DIAGNOSIS — R112 Nausea with vomiting, unspecified: Secondary | ICD-10-CM

## 2017-08-08 DIAGNOSIS — Z885 Allergy status to narcotic agent status: Secondary | ICD-10-CM

## 2017-08-08 DIAGNOSIS — Z955 Presence of coronary angioplasty implant and graft: Secondary | ICD-10-CM

## 2017-08-08 DIAGNOSIS — K029 Dental caries, unspecified: Secondary | ICD-10-CM

## 2017-08-08 DIAGNOSIS — E785 Hyperlipidemia, unspecified: Secondary | ICD-10-CM | POA: Diagnosis present

## 2017-08-08 DIAGNOSIS — Z9114 Patient's other noncompliance with medication regimen: Secondary | ICD-10-CM

## 2017-08-08 DIAGNOSIS — R109 Unspecified abdominal pain: Secondary | ICD-10-CM

## 2017-08-08 LAB — COMPREHENSIVE METABOLIC PANEL
ALT: 15 U/L (ref 14–54)
AST: 30 U/L (ref 15–41)
Albumin: 3.6 g/dL (ref 3.5–5.0)
Alkaline Phosphatase: 68 U/L (ref 38–126)
Anion gap: 16 — ABNORMAL HIGH (ref 5–15)
BILIRUBIN TOTAL: 1 mg/dL (ref 0.3–1.2)
BUN: 23 mg/dL — ABNORMAL HIGH (ref 6–20)
CHLORIDE: 102 mmol/L (ref 101–111)
CO2: 19 mmol/L — ABNORMAL LOW (ref 22–32)
Calcium: 9.9 mg/dL (ref 8.9–10.3)
Creatinine, Ser: 1.59 mg/dL — ABNORMAL HIGH (ref 0.44–1.00)
GFR calc Af Amer: 45 mL/min — ABNORMAL LOW (ref >60)
GFR, EST NON AFRICAN AMERICAN: 39 mL/min — AB (ref >60)
Glucose, Bld: 335 mg/dL — ABNORMAL HIGH (ref 65–99)
Potassium: 4.1 mmol/L (ref 3.5–5.1)
Sodium: 137 mmol/L (ref 135–145)
Total Protein: 8.7 g/dL — ABNORMAL HIGH (ref 6.5–8.1)

## 2017-08-08 LAB — URINALYSIS, ROUTINE W REFLEX MICROSCOPIC
KETONES UR: 15 mg/dL — AB
LEUKOCYTES UA: NEGATIVE
Nitrite: NEGATIVE
Specific Gravity, Urine: >1.03 — ABNORMAL HIGH (ref 1.005–1.030)
pH: 6 (ref 5.0–8.0)

## 2017-08-08 LAB — RAPID URINE DRUG SCREEN, HOSP PERFORMED
Amphetamines: NOT DETECTED
BARBITURATES: NOT DETECTED
BENZODIAZEPINES: NOT DETECTED
COCAINE: POSITIVE — AB
Opiates: NOT DETECTED
Tetrahydrocannabinol: NOT DETECTED

## 2017-08-08 LAB — URINALYSIS, MICROSCOPIC (REFLEX)

## 2017-08-08 LAB — BASIC METABOLIC PANEL
Anion gap: 10 (ref 5–15)
BUN: 17 mg/dL (ref 6–20)
CALCIUM: 8.8 mg/dL — AB (ref 8.9–10.3)
CO2: 22 mmol/L (ref 22–32)
Chloride: 107 mmol/L (ref 101–111)
Creatinine, Ser: 1.03 mg/dL — ABNORMAL HIGH (ref 0.44–1.00)
GFR calc Af Amer: >60 mL/min (ref >60)
Glucose, Bld: 206 mg/dL — ABNORMAL HIGH (ref 65–99)
POTASSIUM: 3.7 mmol/L (ref 3.5–5.1)
SODIUM: 139 mmol/L (ref 135–145)

## 2017-08-08 LAB — CBC
HCT: 39.1 % (ref 36.0–46.0)
Hemoglobin: 13.5 g/dL (ref 12.0–15.0)
MCH: 26.2 pg (ref 26.0–34.0)
MCHC: 34.5 g/dL (ref 30.0–36.0)
MCV: 75.9 fL — AB (ref 78.0–100.0)
Platelets: 534 10*3/uL — ABNORMAL HIGH (ref 150–400)
RBC: 5.15 MIL/uL — ABNORMAL HIGH (ref 3.87–5.11)
RDW: 15.8 % — ABNORMAL HIGH (ref 11.5–15.5)
WBC: 18.4 10*3/uL — AB (ref 4.0–10.5)

## 2017-08-08 LAB — CBG MONITORING, ED: Glucose-Capillary: 207 mg/dL — ABNORMAL HIGH (ref 65–99)

## 2017-08-08 LAB — I-STAT BETA HCG BLOOD, ED (MC, WL, AP ONLY): I-stat hCG, quantitative: <5 m[IU]/mL (ref <5)

## 2017-08-08 LAB — HEMOGLOBIN A1C
HEMOGLOBIN A1C: 10.2 % — AB (ref 4.8–5.6)
MEAN PLASMA GLUCOSE: 246.04 mg/dL

## 2017-08-08 LAB — LIPASE, BLOOD: LIPASE: 22 U/L (ref 11–51)

## 2017-08-08 LAB — GLUCOSE, CAPILLARY
GLUCOSE-CAPILLARY: 163 mg/dL — AB (ref 65–99)
Glucose-Capillary: 197 mg/dL — ABNORMAL HIGH (ref 65–99)

## 2017-08-08 MED ORDER — SODIUM CHLORIDE 0.9 % IV BOLUS
1000.0000 mL | Freq: Once | INTRAVENOUS | Status: AC
  Administered 2017-08-08: 1000 mL via INTRAVENOUS

## 2017-08-08 MED ORDER — METOCLOPRAMIDE HCL 5 MG PO TABS: 5.0000 mg | ORAL_TABLET | Freq: Three times a day (TID) | ORAL | Status: DC

## 2017-08-08 MED ORDER — METOCLOPRAMIDE HCL 5 MG/ML IJ SOLN
5.0000 mg | Freq: Three times a day (TID) | INTRAMUSCULAR | Status: DC | PRN
  Administered 2017-08-08: 5 mg via INTRAVENOUS
  Filled 2017-08-08 (×2): qty 2

## 2017-08-08 MED ORDER — ONDANSETRON HCL 4 MG/2ML IJ SOLN
4.0000 mg | Freq: Once | INTRAMUSCULAR | Status: AC
  Administered 2017-08-08: 4 mg via INTRAVENOUS
  Filled 2017-08-08: qty 2

## 2017-08-08 MED ORDER — HYDRALAZINE HCL 20 MG/ML IJ SOLN
10.0000 mg | Freq: Four times a day (QID) | INTRAMUSCULAR | Status: DC | PRN
  Administered 2017-08-09 (×2): 10 mg via INTRAVENOUS
  Filled 2017-08-08 (×3): qty 1

## 2017-08-08 MED ORDER — ONDANSETRON 4 MG PO TBDP: 4.0000 mg | ORAL_TABLET | Freq: Once | ORAL | Status: DC | PRN

## 2017-08-08 MED ORDER — CYCLOBENZAPRINE HCL 10 MG PO TABS
10.0000 mg | ORAL_TABLET | Freq: Two times a day (BID) | ORAL | Status: DC
  Administered 2017-08-08 – 2017-08-13 (×10): 10 mg via ORAL
  Filled 2017-08-08 (×11): qty 1

## 2017-08-08 MED ORDER — INSULIN ASPART 100 UNIT/ML ~~LOC~~ SOLN
0.0000 [IU] | Freq: Three times a day (TID) | SUBCUTANEOUS | Status: DC
  Administered 2017-08-08 – 2017-08-09 (×4): 2 [IU] via SUBCUTANEOUS
  Administered 2017-08-10: 1 [IU] via SUBCUTANEOUS
  Administered 2017-08-10: 2 [IU] via SUBCUTANEOUS
  Administered 2017-08-11: 1 [IU] via SUBCUTANEOUS
  Administered 2017-08-11: 2 [IU] via SUBCUTANEOUS
  Administered 2017-08-12: 9 [IU] via SUBCUTANEOUS

## 2017-08-08 MED ORDER — DIPHENHYDRAMINE HCL 50 MG/ML IJ SOLN
12.5000 mg | Freq: Three times a day (TID) | INTRAMUSCULAR | Status: AC | PRN
  Administered 2017-08-09: 12.5 mg via INTRAVENOUS
  Filled 2017-08-08: qty 1

## 2017-08-08 MED ORDER — HALOPERIDOL LACTATE 5 MG/ML IJ SOLN
5.0000 mg | Freq: Once | INTRAMUSCULAR | Status: AC
  Administered 2017-08-08: 5 mg via INTRAVENOUS
  Filled 2017-08-08: qty 1

## 2017-08-08 MED ORDER — METOPROLOL TARTRATE 100 MG PO TABS: 100.0000 mg | ORAL_TABLET | Freq: Two times a day (BID) | ORAL | Status: DC

## 2017-08-08 MED ORDER — SODIUM CHLORIDE 0.9 % IV SOLN: INTRAVENOUS | Status: AC

## 2017-08-08 MED ORDER — INSULIN ASPART 100 UNIT/ML ~~LOC~~ SOLN
5.0000 [IU] | Freq: Once | SUBCUTANEOUS | Status: AC
  Administered 2017-08-08: 5 [IU] via SUBCUTANEOUS

## 2017-08-08 MED ORDER — APIXABAN 5 MG PO TABS
5.0000 mg | ORAL_TABLET | Freq: Two times a day (BID) | ORAL | Status: DC
  Administered 2017-08-08 – 2017-08-12 (×7): 5 mg via ORAL
  Filled 2017-08-08 (×8): qty 1

## 2017-08-08 MED ORDER — LABETALOL HCL 5 MG/ML IV SOLN: 10.0000 mg | INTRAVENOUS | Status: DC | PRN

## 2017-08-08 MED ORDER — DIPHENHYDRAMINE HCL 25 MG PO CAPS: 25.0000 mg | ORAL_CAPSULE | Freq: Three times a day (TID) | ORAL | Status: DC | PRN

## 2017-08-08 MED ORDER — ACETAMINOPHEN 650 MG RE SUPP
650.0000 mg | Freq: Four times a day (QID) | RECTAL | Status: DC | PRN
Start: 2017-08-08 — End: 2017-08-13

## 2017-08-08 MED ORDER — ACETAMINOPHEN 325 MG PO TABS
650.0000 mg | ORAL_TABLET | Freq: Four times a day (QID) | ORAL | Status: DC | PRN
  Administered 2017-08-09 – 2017-08-13 (×7): 650 mg via ORAL
  Filled 2017-08-08 (×8): qty 2

## 2017-08-08 NOTE — ED Notes (Signed)
Patient drank approx 23ml of fluid before stopping because of upset stomach.

## 2017-08-08 NOTE — Progress Notes (Signed)
Patient arrived to the floor via cart from ED, she has difficulty staying awake, VS abnormal Dr. Lucita Lora notified, actively vomitting requested medications to be changed to IVP for the time being until patient no longer vomitting. Pt refusing to answer questions for admissions process, demanding to wait until she wakes up.

## 2017-08-08 NOTE — ED Notes (Signed)
Notified MD re: elevated blood pressure. MD request that held orders be released immediately.

## 2017-08-08 NOTE — ED Provider Notes (Signed)
6:26 AM   Patient taken in sign out From PA Sanders. Patient well known to this ED . She has a pmh DM2. She has a hx of gastroparesis.  Patient presented for nausea and vomiting.  She has history of massive dehydration.  Anion gap now is 16 however this is likely due to massive dehydration.  She is getting 2 L fluid. Given haldol 5  Physical Exam  BP 138/82   Pulse (!) 121   Temp 98 F (36.7 C) (Oral)   Resp 13   Ht 5\' 7"  (1.702 m)   Wt 90.7 kg (200 lb)   SpO2 98%   BMI 31.32 kg/m   Physical Exam And appears ill, tachycardic, lungs clear to auscultation, tender in the abdomen diffusely. ED Course/Procedures     Procedures  MDM  Patient persistently tachycardic despite fluid rehydration.  She is been given 2 L of fluid and currently has a heart rate of 133 on the monitor.  Patient continues to have abdominal pain she has not vomited but also refused p.o. challenge.  Patient will be admitted for gastroparesis.       Margarita Mail, PA-C 08/08/17 1541    Julianne Rice, MD 08/09/17 [phone]

## 2017-08-08 NOTE — ED Triage Notes (Signed)
Pt reports LLQ abdominal pain and vomiting x 24hours. Denies diarrhea states LBM Saturday. Also reports not taking BP medicine x over 1 month

## 2017-08-08 NOTE — ED Notes (Signed)
Attempted IV access x2, without success.  

## 2017-08-08 NOTE — H&P (Signed)
Date: 08/08/2017               Patient Name:  Shirley [name] MRN: [id number]  DOB: 1975/09/17 Age / Sex: 42 y.o., female   PCP: Clent Demark, PA-C         Medical Service: Internal Medicine Teaching Service         Attending Physician: Dr. Aldine Contes, MD    First Contact: Dr. Johny Chess Pager: [phone]  Second Contact: Dr. Heber Milton Pager: [phone]       After Hours (After 5p/  First Contact Pager: [phone]  weekends / holidays): Second Contact Pager: [phone]   Chief Complaint: Abdominal Pain   History of Present Illness: [name] is a 42 yo F with a past medical history of diabetes with gastroparesis, PE, uterine fobroids s/p [place], anemia, CAD, prior CVA, fibromyalgia who presented to the ED with complaints of abdominal pain, vomiting.   She reports onset of left-sided abdominal pain around 4 days ago with multiple episodes of emesis (brownish liquid) over that time.  The abdominal pain is constant and sharp with no alleviating factors.  She does not have any antinausea medications or other medicines that have been tried at home.  She states she has not continued to have significant food intake but has been able to drink some fluids since symptom onset.  Her last bowel movement was the day of onset, unable to characterize her normal pattern of bowel movement at baseline. She has had similar presentations in the past and she feels current sx are similar. Of note, she is not currently taking any medications as she has not had any for the last several months, unable to say exactly how long.   In the ED, T 98, HR 118, BP 164/97, 99% on RA. Labs notable for CO2 19, Cr 1.59, WBC 18.4, blood glucose 335. U/A negative for infection. She received 2 L IVF and Zofran, monitored, and eventually admitted for further management given persistent tachycardia and lack of ideal improvement.   Meds:  No outpatient medications have been marked as taking for the 08/08/17 encounter [facility]  Encounter).     Allergies: Allergies as of 08/08/2017 - Review Complete 08/08/2017  Allergen Reaction Noted  . Lisinopril Nausea Only and Swelling 03/17/2008  . Morphine and related Itching and Swelling 04/15/2013   Past Medical History:  Diagnosis Date  . Abscess of tunica vaginalis    10/09- Abundant S. aureus- sensitive to all abx  . Anxiety   . Blood dyscrasia   . CAD (coronary artery disease) 06/15/2006   s/p Subendocardial MI with PDA angioplasty(no stent) on 06/15/06 and relook  cath 06/19/06 showed patency of site. Cath 12/10- no restenosis or significant CAD progression  . CVA (cerebral vascular accident) ([place]) ~ 02/2014   denies residual on 04/22/2014  . CVA (cerebral vascular accident) [facility])    history of remote right cerebellar infarct noted on head CT at least since 10/2011  . Depression   . Diabetes mellitus type 2, uncontrolled, with complications ([place])   . Fibromyalgia   . Gastritis   . Gastroparesis    secondary to poorly controlled DM, last emptying study performed 01/2010  was normal but may be falsely positive as pt was on reglan  . GERD (gastroesophageal reflux disease)   . Hepatitis B, chronic (HCC)    Hep BeAb+,Hep B cAb+ & Hep BsAg+ (9/06)  . History of pyelonephritis    H/o GrpB Pyelonephritis (9/06) and UTI- 07/11-  E.Coli, 12/10- GBS  . Hyperlipidemia   . Hypertension   . Iron deficiency anemia   . Irregular menses    Small ovarian follicles seen on VP(7/10)  . MI (myocardial infarction) ([place]) 05/2006   PDA percutaneous transluminal coronary angioplasty  . Migraine    "weekly" (04/22/2014)  . N&V (nausea and vomiting)    Chronic. Unclear etiology with multiple admission and ED visits. CT abdomen with and without contrast (02/2011)  showed no acute process. Gastic Emptying scan (01/2010) was normal. Ultrasound of the abdomen was within normal limits. Hepatitis B viral load was undectable. HIV NR. EGD - gastritis, Hpylori + s/p Rx  . Obesity   . OSA  (obstructive sleep apnea)    "suppose to wear mask but I don't" (04/22/2014)  . Peripheral neuropathy   . Pneumonia    "this is probably the 2nd or 3rd time I've had pneumonia" (04/22/2014)  . Recurrent boils   . Seasonal asthma   . Substance abuse ([place])   . Thrombocytosis (HCC)    Hem/Onc suggested 2/2 chronic hepatits and/or iron deficiency anemia    Family History:  Family History  Problem Relation Age of Onset  . Diabetes Father      Social History:  Social History   Tobacco Use  . Smoking status: Former Smoker    Types: Cigarettes    Last attempt to quit: 04/24/1996    Years since quitting: 21.3  . Smokeless tobacco: Never Used  . Tobacco comment: quit smoking cigarettes age 75  Substance Use Topics  . Alcohol use: No    Alcohol/week: 0.0 oz    Comment: 04/22/2014 "might have a few drinks a month"  . Drug use: No    Types: Marijuana, Cocaine    Comment: 04/22/2104 "quit drugs ~ 1-2 yr ago"     Review of Systems: A complete ROS was negative except as per HPI.   Physical Exam: Blood pressure (!) 177/105, pulse (!) 124, temperature 98 F (36.7 C), temperature source Oral, resp. rate 16, height 5\' 7"  (1.702 m), weight 200 lb (90.7 kg), SpO2 97 %. General: Resting in bed periodically shaking due to discomfort, no acute distress Head: Normocephalic, atraumatic Eyes: PERRL, EOMI ENT: Dental caries with front incisor having dried blood from base, dry mucus membranes CV: Tachycardia, no murmur appreciated Resp: Clear breath sounds bilaterally, normal work of breathing, no distress  Abd: Soft, +BS, obese, tenderness to palpation of L side of abdomen with no rebound, no guarding. Exam inconsistent with shaking behavior while not examining Extr: No LE edema, good peripheral pulses, normal skin turgor Neuro: Alert, follows commands appropriately Skin: Warm, dry    EKG: personally reviewed my interpretation is sinus tachycardia, QT appears prolonged but calculated is wnl,  intervals otherwise wnl, no ischemic changes appreciated.   Assessment & Plan by Problem:  Abdominal Pain, Nausea and Vomiting, H/o Gastroparesis  Presenting with left sided abdominal pain with associated nausea and vomiting, similar presentation to numerous ED visits in the past and most likely due to her history of diabetic gastroparesis, though this is a clinical diagnosis at this point given no gastric emptying study on chart review. Her diabetes remains poorly controlled, not currently making any meal adjustments or on home anti-emetics. She has received 2 L IVF bolus in ED along with Zofran. Her QT appears prolonged on current EKG and she has had prolonged in the past. She received IV haldol in ED. Given need for anti-emetic treatment will need to monitor.  --  Monitor vital signs --Rpt BMP, CBC --Clear liquid diet if pt tolerates --Additional 1 L IVF bolus + 150 cc/hr following  --Reglan 5 mg TID and diphenhydramine 25 q8hr prn  --Tylenol, will avoid narcotics given gastroparesis --UDS     Acute Kidney Injury, Dehydration   Baseline Cr appears to be around 1-1.2, slightly elevated on admission labs at 1.59 likely representing pre-renal azotemia with vomiting and decreased intake. Dehydration and relative hemoconcentration likely also explains elevation in white count compared to prior labs.  --IVF as above, BMP  --Hold home Losartan, thiazide   H/o Diabetes Previously intended to be on insulin regimen with 70/30 50 U BID, though states she is not currently on any medicines including for glycemic control. Bg elevated to 335 on presentation.  --Novolog 5 U x1, SSI TIDac sensitive and increase prn with improved po intake  --Rpt A1c   H/o HTN Elevated on arrival in setting of medication non-adherence, will hold home losartan and thiazide as above given dehydration, kidney injury and thiazide. Will continue metoprolol  H/o PE Currently intended to be on eliquis as an outpatient, will  resume as inpatient. Her tachycardia may be related to dehydration, pain and she is not currently complaining of respiratory sx. Will repeat labs to assess renal function and dose adjust if necessary. --Apixaban 5 mg BID    Dispo: Admit patient to Observation with expected length of stay less than 2 midnights.  Signed: Tawny Asal, MD 08/08/2017, 12:02 PM  Pager: [phone]

## 2017-08-08 NOTE — ED Provider Notes (Signed)
[place] EMERGENCY DEPARTMENT Provider Note   CSN: [id number] Arrival date & time: 08/08/17  0047     History   Chief Complaint Chief Complaint  Patient presents with  . Abdominal Pain  . Emesis  . Hypertension    HPI Shirley [name] Cousin is a 42 y.o. female.  The history is provided by the patient and medical records.  Abdominal Pain   Associated symptoms include nausea and vomiting.  Emesis   Associated symptoms include abdominal pain.  Hypertension  Associated symptoms include abdominal pain.    42 year old female with history of coronary artery disease, anxiety, prior CVA, depression, diabetes, fibromyalgia, gastroparesis, GERD, chronic hepatitis B, hyperlipidemia, hypertension, presenting to the ED for abdominal pain, nausea, and vomiting.  States this is been ongoing for the past 24 hours.  Reports lower abdominal pain, worse on her left side.  States it feels similar to her prior abdominal pain related to gastroparesis.  States her blood sugars have been running in the 300s lately, usually she is in the 200s.  She has not changed anything with her diet.  Has had trouble tolerating oral fluids due to her vomiting.  Prior abdominal surgeries include C-section x2.  Past Medical History:  Diagnosis Date  . Abscess of tunica vaginalis    10/09- Abundant S. aureus- sensitive to all abx  . Anxiety   . Blood dyscrasia   . CAD (coronary artery disease) 06/15/2006   s/p Subendocardial MI with PDA angioplasty(no stent) on 06/15/06 and relook  cath 06/19/06 showed patency of site. Cath 12/10- no restenosis or significant CAD progression  . CVA (cerebral vascular accident) ([place]) ~ 02/2014   denies residual on 04/22/2014  . CVA (cerebral vascular accident) [facility])    history of remote right cerebellar infarct noted on head CT at least since 10/2011  . Depression   . Diabetes mellitus type 2, uncontrolled, with complications ([place])   . Fibromyalgia   . Gastritis   .  Gastroparesis    secondary to poorly controlled DM, last emptying study performed 01/2010  was normal but may be falsely positive as pt was on reglan  . GERD (gastroesophageal reflux disease)   . Hepatitis B, chronic (HCC)    Hep BeAb+,Hep B cAb+ & Hep BsAg+ (9/06)  . History of pyelonephritis    H/o GrpB Pyelonephritis (9/06) and UTI- 07/11- E.Coli, 12/10- GBS  . Hyperlipidemia   . Hypertension   . Iron deficiency anemia   . Irregular menses    Small ovarian follicles seen on QZ(0/09)  . MI (myocardial infarction) ([place]) 05/2006   PDA percutaneous transluminal coronary angioplasty  . Migraine    "weekly" (04/22/2014)  . N&V (nausea and vomiting)    Chronic. Unclear etiology with multiple admission and ED visits. CT abdomen with and without contrast (02/2011)  showed no acute process. Gastic Emptying scan (01/2010) was normal. Ultrasound of the abdomen was within normal limits. Hepatitis B viral load was undectable. HIV NR. EGD - gastritis, Hpylori + s/p Rx  . Obesity   . OSA (obstructive sleep apnea)    "suppose to wear mask but I don't" (04/22/2014)  . Peripheral neuropathy   . Pneumonia    "this is probably the 2nd or 3rd time I've had pneumonia" (04/22/2014)  . Recurrent boils   . Seasonal asthma   . Substance abuse ([place])   . Thrombocytosis ([place])    Hem/Onc suggested 2/2 chronic hepatits and/or iron deficiency anemia    Patient Active Problem  List   Diagnosis Date Noted  . Chronic maxillary sinusitis 01/02/2017  . Acute blood loss anemia 11/13/2016  . Menorrhagia 11/13/2016  . Bilateral pulmonary embolism ([place]) 10/30/2016  . Acute DVT (deep venous thrombosis) ([place]) 10/30/2016  . Type 2 diabetes mellitus with hyperglycemia ([place]) 04/07/2016  . Diabetic gastroparesis ([place]) 04/06/2016  . Dehydration 03/27/2015  . Gastroparesis 11/11/2014  . Hematemesis 10/13/2014  . DKA (diabetic ketoacidoses) ([place]) 10/13/2014  . Tachycardia 07/21/2014  . Abdominal pain, chronic, left  lower quadrant   . Diabetic gastroparesis associated with type 2 diabetes mellitus ([place]) 04/28/2014  . History of Helicobacter pylori infection 04/22/2014  . Vaginal discharge 02/18/2014  . Atypical chest pain 07/22/2013  . Unspecified constipation 07/21/2013  . Tinea corporis 07/21/2013  . Intractable vomiting 04/29/2013  . Dysmenorrhea 04/22/2013  . UTI (urinary tract infection) 07/15/2012  . Headache(784.0) 02/08/2012  . Health care maintenance 01/22/2012  . Chronic hepatitis B ([place]) 03/07/2011  . History of leukocytosis 04/06/2010  . THROMBOCYTOSIS 04/06/2010  . Polysubstance abuse ([place]) 02/23/2010  . Iron deficiency anemia 11/22/2009  . PERIPHERAL NEUROPATHY 10/01/2009  . Hyperlipidemia 08/30/2009  . Diabetic polyneuropathy ([place]) 08/30/2009  . Hidradenitis (recurrent boils) 07/07/2008  . Depression 12/27/2007  . Abdominal pain, left lower quadrant 11/21/2007  . FIBROMYALGIA 10/30/2007  . BACK PAIN 04/01/2007  . OBSTRUCTIVE SLEEP APNEA 01/17/2007  . ANXIETY DEPRESSION 06/27/2006  . Chronic ischemic heart disease 06/15/2006  . OBESITY, MORBID 05/15/2006  . MIGRAINE HEADACHE 05/15/2006  . Asthma 05/15/2006  . Essential hypertension 01/16/2006  . IRREGULAR MENSTRUATION 01/16/2006  . PEDAL EDEMA 01/16/2006  . Poorly controlled type 2 diabetes mellitus with peripheral neuropathy ([place]) 01/16/1989    Past Surgical History:  Procedure Laterality Date  . CESAREAN SECTION  1997  . CORONARY ANGIOPLASTY WITH STENT PLACEMENT  2008   "2 stents"  . ESOPHAGOGASTRODUODENOSCOPY N/A 04/23/2014   Procedure: ESOPHAGOGASTRODUODENOSCOPY (EGD);  Surgeon: Winfield Cunas., MD;  Location: [facility] ENDOSCOPY;  Service: Endoscopy;  Laterality: N/A;  . IR ANGIOGRAM PELVIS SELECTIVE OR SUPRASELECTIVE  12/07/2016  . IR ANGIOGRAM PELVIS SELECTIVE OR SUPRASELECTIVE  12/07/2016  . IR ANGIOGRAM SELECTIVE EACH ADDITIONAL VESSEL  12/07/2016  . IR ANGIOGRAM SELECTIVE EACH ADDITIONAL VESSEL  12/07/2016  . IR  EMBO ARTERIAL NOT HEMORR HEMANG INC GUIDE ROADMAPPING  12/07/2016  . IR RADIOLOGIST EVAL & MGMT  01/09/2017  . IR US GUIDE VASC ACCESS RIGHT  12/07/2016     OB History    Gravida  2   Para  2   Term  2   Preterm      AB      Living        SAB      TAB      Ectopic      Multiple      Live Births  2            Home Medications    Prior to Admission medications   Medication Sig Start Date End Date Taking? Authorizing Provider  albuterol (PROVENTIL HFA;VENTOLIN HFA) 108 (90 Base) MCG/ACT inhaler Inhale 2 puffs into the lungs every 4 (four) hours as needed for wheezing or shortness of breath. 09/27/16   Clent Demark, PA-C  apixaban (ELIQUIS) 5 MG TABS tablet Take 1 tablet (5 mg total) by mouth 2 (two) times daily. 11/18/16   Rice, Resa Miner, MD  cyclobenzaprine (FLEXERIL) 10 MG tablet Take 1 tablet (10 mg total) by mouth 2 (two) times daily. 02/07/17  Clent Demark, PA-C  erythromycin (E-MYCIN) 250 MG tablet Take 1 tablet (250 mg total) by mouth 4 (four) times daily. 02/07/17   Clent Demark, PA-C  glucose blood (FREESTYLE TEST STRIPS) test strip Use as instructed 09/05/16   Clent Demark, PA-C  glucose monitoring kit (FREESTYLE) monitoring kit 1 each by Does not apply route 4 (four) times daily - after meals and at bedtime. 1 month Diabetic Testing Supplies for QAC-QHS accuchecks. 09/05/16   Clent Demark, PA-C  hydrochlorothiazide (HYDRODIURIL) 25 MG tablet Take 1 tablet (25 mg total) by mouth daily. Take on tablet in the morning. 02/07/17   Clent Demark, PA-C  insulin NPH-regular Human (NOVOLIN 70/30) (70-30) 100 UNIT/ML injection 50 units in the morning and 50 units in the evening. 02/07/17   Clent Demark, PA-C  INSULIN SYRINGE .5CC/28G (INS SYRINGE/NEEDLE .5CC/28G) 28G X 1/2" 0.5 ML MISC Please provide 1 month supply 01/02/17   Clent Demark, PA-C  Lancets (FREESTYLE) lancets Use as instructed 01/02/17   Clent Demark, PA-C    losartan (COZAAR) 50 MG tablet Take 1 tablet (50 mg total) by mouth daily. 02/07/17   Clent Demark, PA-C  metoprolol tartrate (LOPRESSOR) 100 MG tablet Take 1 tablet (100 mg total) by mouth 2 (two) times daily. 02/07/17   Clent Demark, PA-C  ondansetron (ZOFRAN) 4 MG tablet Take 1 tablet (4 mg total) by mouth every 6 (six) hours as needed for nausea or vomiting. 0/0/76   Delora Fuel, MD  promethazine (PHENERGAN) 25 MG suppository Place 1 suppository (25 mg total) rectally every 6 (six) hours as needed for nausea or vomiting. 22/63/33   Delora Fuel, MD  promethazine (PHENERGAN) 25 MG tablet Take 1 tablet (25 mg total) by mouth every 6 (six) hours as needed for nausea or vomiting. 54/56/25   Delora Fuel, MD    Family History Family History  Problem Relation Age of Onset  . Diabetes Father     Social History Social History   Tobacco Use  . Smoking status: Former Smoker    Types: Cigarettes    Last attempt to quit: 04/24/1996    Years since quitting: 21.3  . Smokeless tobacco: Never Used  . Tobacco comment: quit smoking cigarettes age 25  Substance Use Topics  . Alcohol use: No    Alcohol/week: 0.0 oz    Comment: 04/22/2014 "might have a few drinks a month"  . Drug use: No    Types: Marijuana, Cocaine    Comment: 04/22/2104 "quit drugs ~ 1-2 yr ago"     Allergies   Lisinopril and Morphine and related   Review of Systems Review of Systems  Gastrointestinal: Positive for abdominal pain, nausea and vomiting.  All other systems reviewed and are negative.    Physical Exam Updated Vital Signs BP (!) 164/97   Pulse (!) 139   Temp 98 F (36.7 C) (Oral)   Resp (!) 45   Ht 5' 7"  (1.702 m)   Wt 90.7 kg (200 lb)   SpO2 99%   BMI 31.32 kg/m   Physical Exam  Constitutional: She is oriented to person, place, and time. She appears well-developed and well-nourished.  Repeatedly shaking and flailing around in bed, this is intentional and stops when distracted   HENT:  Head: Normocephalic and atraumatic.  Mouth/Throat: Oropharynx is clear and moist.  Eyes: Pupils are equal, round, and reactive to light. Conjunctivae and EOM are normal.  Neck: Normal range of motion.  Cardiovascular:  Normal rate, regular rhythm and normal heart sounds.  Tachycardic but due to movement (returns to normal when she stops shaking)  Pulmonary/Chest: Effort normal and breath sounds normal.  Abdominal: Soft. Bowel sounds are normal. There is no tenderness. There is no rigidity and no guarding.  Soft, nontender, normal bowel sounds, no distention  Musculoskeletal: Normal range of motion.  Neurological: She is alert and oriented to person, place, and time.  Skin: Skin is warm and dry.  Psychiatric: She has a normal mood and affect.  Nursing note and vitals reviewed.    ED Treatments / Results  Labs (all labs ordered are listed, but only abnormal results are displayed) Labs Reviewed  COMPREHENSIVE METABOLIC PANEL - Abnormal; Notable for the following components:      Result Value   CO2 19 (*)    Glucose, Bld 335 (*)    BUN 23 (*)    Creatinine, Ser 1.59 (*)    Total Protein 8.7 (*)    GFR calc non Af Amer 39 (*)    GFR calc Af Amer 45 (*)    Anion gap 16 (*)    All other components within normal limits  CBC - Abnormal; Notable for the following components:   WBC 18.4 (*)    RBC 5.15 (*)    MCV 75.9 (*)    RDW 15.8 (*)    Platelets 534 (*)    All other components within normal limits  LIPASE, BLOOD  URINALYSIS, ROUTINE W REFLEX MICROSCOPIC  RAPID URINE DRUG SCREEN, HOSP PERFORMED  I-STAT BETA HCG BLOOD, ED ([facility], [facility], [facility] ONLY)    EKG None  Radiology No results found.  Procedures Procedures (including critical care time)  Medications Ordered in ED Medications  ondansetron (ZOFRAN-ODT) disintegrating tablet 4 mg (has no administration in time range)  sodium chloride 0.9 % bolus 1,000 mL (0 mLs Intravenous Stopped 08/08/17 0347)  haloperidol lactate  (HALDOL) injection 5 mg (5 mg Intravenous Given 08/08/17 0332)  ondansetron (ZOFRAN) injection 4 mg (4 mg Intravenous Given 08/08/17 0332)  sodium chloride 0.9 % bolus 1,000 mL (1,000 mLs Intravenous New Bag/Given 08/08/17 0545)     Initial Impression / Assessment and Plan / ED Course  I have reviewed the triage vital signs and the nursing notes.  Pertinent labs & imaging results that were available during my care of the patient were reviewed by me and considered in my medical decision making (see chart for details).  42 y.o. F here with abdominal pain, nausea, and vomiting.  Reports worse along left lower abdomen.  Reports it feels like her gastroparesis pain.  Sugars at home in the 300's.  On exam, patient is thrashing around and shaking repeatedly.  This is voluntary as she stops when distracted or asked to stop.  There is no true seizure activity.  Her abdomen is soft, benign, non-tender to palpation.  Labs have been sent.  Will give IVF, zofran, haldol.  Labs overall reassuring-- hyperglycemia with minimally low CO2 at 19, and minimally elevated anion gap at 16.  Patient does have slight bump in her SrCr and BUN consistent with some mild dehydration.  Her CBG is 335 which appears to be around her baseline compared with prior values (usually 250-350).  Do not feel this represents DKA, more liekly dehydration.  Resting comfortably after meds, will monitor.  5:33 AM Patient has been resting here after meds.  No vomiting.  Still a [place] drowsy.  HR improved, still somewhat tachy.  Will give  additional fluids.  Try PO trial when a [place] more awake.  6:29 AM Care signed out to oncoming provider.  Awaiting UA.  If improved and able to tolerate PO, can likely d/c home with symptomatic care and close PCP follow-up.  Final Clinical Impressions(s) / ED Diagnoses   Final diagnoses:  Abdominal pain, unspecified abdominal location  Non-intractable vomiting with nausea, unspecified vomiting type     ED Discharge Orders    None       Larene Pickett, PA-C 08/08/17 0631    Ward, Delice Bison, DO 08/08/17 0700

## 2017-08-09 DIAGNOSIS — Z79899 Other long term (current) drug therapy: Secondary | ICD-10-CM

## 2017-08-09 LAB — BASIC METABOLIC PANEL
Anion gap: 8 (ref 5–15)
BUN: 14 mg/dL (ref 6–20)
CHLORIDE: 105 mmol/L (ref 101–111)
CO2: 24 mmol/L (ref 22–32)
CREATININE: 0.98 mg/dL (ref 0.44–1.00)
Calcium: 8.8 mg/dL — ABNORMAL LOW (ref 8.9–10.3)
GFR calc non Af Amer: >60 mL/min (ref >60)
GLUCOSE: 211 mg/dL — AB (ref 65–99)
Potassium: 3.6 mmol/L (ref 3.5–5.1)
Sodium: 137 mmol/L (ref 135–145)

## 2017-08-09 LAB — GLUCOSE, CAPILLARY
GLUCOSE-CAPILLARY: 139 mg/dL — AB (ref 65–99)
Glucose-Capillary: 199 mg/dL — ABNORMAL HIGH (ref 65–99)
Glucose-Capillary: 151 mg/dL — ABNORMAL HIGH (ref 65–99)
Glucose-Capillary: 159 mg/dL — ABNORMAL HIGH (ref 65–99)

## 2017-08-09 LAB — CBC
HCT: 35.8 % — ABNORMAL LOW (ref 36.0–46.0)
HEMOGLOBIN: 12.5 g/dL (ref 12.0–15.0)
MCH: 26.7 pg (ref 26.0–34.0)
MCHC: 34.9 g/dL (ref 30.0–36.0)
MCV: 76.5 fL — AB (ref 78.0–100.0)
PLATELETS: 388 10*3/uL (ref 150–400)
RBC: 4.68 MIL/uL (ref 3.87–5.11)
RDW: 16.4 % — ABNORMAL HIGH (ref 11.5–15.5)
WBC: 12.7 10*3/uL — ABNORMAL HIGH (ref 4.0–10.5)

## 2017-08-09 MED ORDER — SODIUM CHLORIDE 0.9 % IV SOLN
INTRAVENOUS | Status: AC
  Administered 2017-08-09: 16:00:00 via INTRAVENOUS

## 2017-08-09 MED ORDER — HYDROCHLOROTHIAZIDE 25 MG PO TABS
25.0000 mg | ORAL_TABLET | Freq: Every day | ORAL | Status: DC
  Administered 2017-08-09 – 2017-08-13 (×5): 25 mg via ORAL
  Filled 2017-08-09 (×5): qty 1

## 2017-08-09 MED ORDER — PROMETHAZINE HCL 25 MG/ML IJ SOLN
25.0000 mg | Freq: Four times a day (QID) | INTRAMUSCULAR | Status: DC | PRN
  Administered 2017-08-09 – 2017-08-12 (×8): 25 mg via INTRAVENOUS
  Filled 2017-08-09 (×8): qty 1

## 2017-08-09 MED ORDER — HYDRALAZINE HCL 20 MG/ML IJ SOLN
10.0000 mg | Freq: Once | INTRAMUSCULAR | Status: AC
  Administered 2017-08-09: 10 mg via INTRAVENOUS

## 2017-08-09 NOTE — Progress Notes (Signed)
   Subjective: No acute events overnight following admission, states she has tried sips of gingerale, no vomiting but still with nausea and similar pain.   Objective:  Vital signs in last 24 hours: Vitals:   08/08/17 1738 08/08/17 2056 08/09/17 0401 08/09/17 1359  BP: (!) 202/124 (!) 160/112 (!) 164/94 (!) 188/139  Pulse: (!) 120 (!) 136 (!) 124 (!) 134  Resp:  (!) 22 20 16   Temp: 98.9 F (37.2 C) 98.4 F (36.9 C) 98 F (36.7 C) 98.4 F (36.9 C)  TempSrc: Oral Oral Oral Oral  SpO2: 100% 100% 100% 99%  Weight:      Height:       General: Moving about bed, uncomfortable  CV: Tachycardic, regular rhythm, no murmur appreciated Resp: Clear breath sounds, normal work of breathing, no distress  Abd: Soft, +BS, minimal tenderness to palpation, worse in lower abdomen- not compatible with pt activity during other portions of rounds Extr: No LE edema  Neuro: Alert, answering appropriately, following commands  Skin: Warm, dry      Assessment/Plan:  Abdominal Pain, Nausea and Vomiting, H/o Gastroparesis  Presenting with left sided abdominal pain with associated nausea and vomiting, most likely due to her history of diabetic gastroparesis and similar presentations in the past. Her exam is not consistent with her level of discomfort during talking--able to push abdomen quite firmly with no guarding, rebound tenderness, no peritoneal signs. Will continue to treat with anti-emetics, fluids, advance diet as tolerated. Avoid opiods for worsening of transit. If she fails to improve or worsens, will consider imaging.  --Monitor vital signs --Advance diet as tolerated-clear liquid ordered --Cont IVF- end date this pm  --Phenergan 25 mg q6hr prn (pt states more effective for her than reglan), Benadryl 12.5 mg q8hr prn, Tylenol   Dehydration, Acute Kidney Injury, resolved Baseline creatinine appears to be around 1-1 0.2, slightly elevated on initial labs at 1.59 likely representing prerenal azotemia  with vomiting and decreased intake.  Renal function is improved with fluids, currently 0.98. CBC also improving with fluids, likely hemoconcentration. Will continue to monitor.  --BMP  H/o HTN Elevated in setting of medication non-adherence. Holding home medications given dehydration and kidney injury, positive cocaine. Can likely resume when pt is able to take PO.  --Resume home meds when able, hydral prn   Dispo: Anticipated discharge in approximately 2-3 day(s).   Tawny Asal, MD 08/09/2017, 2:23 PM Pager: [phone]

## 2017-08-09 NOTE — Discharge Instructions (Signed)
Information on my medicine - ELIQUIS (apixaban)  This medication education was reviewed with me or my healthcare representative as part of my discharge preparation.  The pharmacist that spoke with me during my hospital stay was:  Corinda Gubler, [facility]  Why was Eliquis prescribed for you? 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  What do You need to know about Eliquis ? The dose is ONE 5 mg tablet taken TWICE daily.  Eliquis may be taken with or without food.   Try to take the dose about the same time in the morning and in the evening. If you have difficulty swallowing the tablet whole please discuss with your pharmacist how to take the medication safely.  Take Eliquis exactly as prescribed and DO NOT stop taking Eliquis without talking to the doctor who prescribed the medication.  Stopping may increase your risk of developing a new blood clot.  Refill your prescription before you run out.  After discharge, you should have regular check-up appointments with your healthcare provider that is prescribing your Eliquis.    What do you do if you miss a dose? If a dose of ELIQUIS is not taken at the scheduled time, take it as soon as possible on the same day and twice-daily administration should be resumed. The dose should not be doubled to make up for a missed dose.  Important Safety Information A possible side effect of Eliquis is bleeding. You should call your healthcare provider right away if you experience any of the following: ? Bleeding from an injury or your nose that does not stop. ? Unusual colored urine (red or dark brown) or unusual colored stools (red or black). ? Unusual bruising for unknown reasons. ? A serious fall or if you hit your head (even if there is no bleeding).  Some medicines may interact with  Eliquis and might increase your risk of bleeding or clotting while on Eliquis. To help avoid this, consult your healthcare provider or pharmacist prior to using any new prescription or non-prescription medications, including herbals, vitamins, non-steroidal anti-inflammatory drugs (NSAIDs) and supplements.  This website has more information on Eliquis (apixaban): http://www.eliquis.com/eliquis/home

## 2017-08-09 NOTE — Progress Notes (Addendum)
Inpatient Diabetes Program Recommendations  AACE/ADA: New Consensus Statement on Inpatient Glycemic Control (2015)  Target Ranges:  Prepandial:   less than 140 mg/dL      Peak postprandial:   less than 180 mg/dL (1-2 hours)      Critically ill patients:  140 - 180 mg/dL   Attempted to speak with patient about A1c level, glucose control, and insulin accessibility. Patient was laying in the bed guarding abdomen and swaying side to side with pain and nausea. RN at bedside giving Nausea medication. Will see later when appropriate either today or tomorrow.  Thanks,  Tama Headings RN, MSN, BC-ADM, [facility] Inpatient Diabetes Coordinator Team Pager [phone] (8a-5p)

## 2017-08-10 DIAGNOSIS — F1490 Cocaine use, unspecified, uncomplicated: Secondary | ICD-10-CM

## 2017-08-10 LAB — BASIC METABOLIC PANEL
Anion gap: 12 (ref 5–15)
BUN: 13 mg/dL (ref 6–20)
CHLORIDE: 103 mmol/L (ref 101–111)
CO2: 22 mmol/L (ref 22–32)
Calcium: 8.9 mg/dL (ref 8.9–10.3)
Creatinine, Ser: 1.21 mg/dL — ABNORMAL HIGH (ref 0.44–1.00)
GFR calc Af Amer: >60 mL/min (ref >60)
GFR, EST NON AFRICAN AMERICAN: 54 mL/min — AB (ref >60)
GLUCOSE: 146 mg/dL — AB (ref 65–99)
POTASSIUM: 3.2 mmol/L — AB (ref 3.5–5.1)
Sodium: 137 mmol/L (ref 135–145)

## 2017-08-10 LAB — CBC
HEMATOCRIT: 37.7 % (ref 36.0–46.0)
Hemoglobin: 12.9 g/dL (ref 12.0–15.0)
MCH: 26 pg (ref 26.0–34.0)
MCHC: 34.2 g/dL (ref 30.0–36.0)
MCV: 76 fL — AB (ref 78.0–100.0)
Platelets: 448 10*3/uL — ABNORMAL HIGH (ref 150–400)
RBC: 4.96 MIL/uL (ref 3.87–5.11)
RDW: 16.1 % — AB (ref 11.5–15.5)
WBC: 14.4 10*3/uL — ABNORMAL HIGH (ref 4.0–10.5)

## 2017-08-10 LAB — PHOSPHORUS: Phosphorus: 3.7 mg/dL (ref 2.5–4.6)

## 2017-08-10 LAB — GLUCOSE, CAPILLARY
GLUCOSE-CAPILLARY: 122 mg/dL — AB (ref 65–99)
Glucose-Capillary: 148 mg/dL — ABNORMAL HIGH (ref 65–99)
Glucose-Capillary: 167 mg/dL — ABNORMAL HIGH (ref 65–99)
Glucose-Capillary: 123 mg/dL — ABNORMAL HIGH (ref 65–99)

## 2017-08-10 LAB — MAGNESIUM: Magnesium: 1.8 mg/dL (ref 1.7–2.4)

## 2017-08-10 MED ORDER — KETOROLAC TROMETHAMINE 15 MG/ML IJ SOLN
15.0000 mg | Freq: Four times a day (QID) | INTRAMUSCULAR | Status: DC | PRN
  Administered 2017-08-10 – 2017-08-11 (×2): 15 mg via INTRAVENOUS
  Filled 2017-08-10 (×2): qty 1

## 2017-08-10 MED ORDER — METOPROLOL TARTRATE 100 MG PO TABS
100.0000 mg | ORAL_TABLET | Freq: Two times a day (BID) | ORAL | Status: DC
  Administered 2017-08-10 – 2017-08-13 (×5): 100 mg via ORAL
  Filled 2017-08-10 (×6): qty 1

## 2017-08-10 MED ORDER — POTASSIUM CHLORIDE 10 MEQ/100ML IV SOLN
10.0000 meq | INTRAVENOUS | Status: AC
  Administered 2017-08-10 (×4): 10 meq via INTRAVENOUS
  Filled 2017-08-10 (×4): qty 100

## 2017-08-10 MED ORDER — SODIUM CHLORIDE 0.9 % IV SOLN
INTRAVENOUS | Status: AC
  Administered 2017-08-10 (×2): via INTRAVENOUS

## 2017-08-10 NOTE — Progress Notes (Signed)
   Subjective: No acute events overnight, she reports continued abdominal pain with no significant change in intensity or character. Has continued to try sips of gingerale but still not tolerating well and had small volume emesis. There is some PO intake documented with I/Os. Denies any shortness of breath   Objective:  Vital signs in last 24 hours: Vitals:   08/09/17 1530 08/09/17 1811 08/09/17 2006 08/10/17 0445  BP: (!) 184/114 (!) 169/112 (!) 185/130 (!) 141/98  Pulse: (!) 131 (!) 133 (!) 133 (!) 150  Resp:   (!) 23 (!) 23  Temp:   98.5 F (36.9 C) 99 F (37.2 C)  TempSrc:   Oral Oral  SpO2:   100% 92%  Weight:      Height:       General: Laying more still but remains uncomfortable  CV: Tachycardic, regular rhythm, no murmur appreciated Resp: Clear breath sounds, normal work of breathing, no distress  Abd: Soft, +BS, minimal tenderness to palpation, worse in lower abdomen- stable compared to prior  Extr: No LE edema  Neuro: Alert, answering appropriately, following commands  Skin: Warm, dry      Assessment/Plan:  Abdominal Pain, Nausea and Vomiting, H/o Gastroparesis  Presenting with left sided abdominal pain with associated nausea and vomiting, most likely due to her history of diabetic gastroparesis and similar presentations in the past. Her exam is not consistent with her level of discomfort during talking--able to push abdomen quite firmly with no guarding, rebound tenderness, no peritoneal signs. Will continue to treat with anti-emetics, fluids, advance diet as tolerated. Avoid opiods for worsening of transit. If she fails to improve or worsens, will consider imaging. She is still persistently tachycardic with no significant change with hydration-will restart bb as she is further out from cocaine use on uds.  --Monitor vital signs --Clear liquid, advance as tolerated  --Reduce IVF rate, maintenance at 75 /hr --Phenergan 25 mg q6hr prn (pt states more effective for her  than reglan), Benadryl 12.5 mg q8hr prn, Tylenol   Dehydration, Acute Kidney Injury, resolved Baseline creatinine appears to be around 1-1.2, slightly elevated on initial labs at 1.59 likely representing prerenal azotemia with vomiting and decreased intake.  Renal function is improved with fluids, currently within baseline at 1.2. Will continue to monitor.  --BMP, replete lytes as indicated   H/o HTN Elevated in setting of medication non-adherence. Initially held home medications given dehydration and kidney injury, positive cocaine. Resumed home thiazide with some improvement, will resume home metop for htn, tachycardia.  --Cont home HCTZ, metoprolol, hydral prn  --Hold home arb   Dispo: Anticipated discharge in approximately 2-3 day(s).   Tawny Asal, MD 08/10/2017, 10:57 AM Pager: [phone]

## 2017-08-10 NOTE — Progress Notes (Signed)
Inpatient Diabetes Program   AACE/ADA: New Consensus Statement on Inpatient Glycemic Control (2015)  Target Ranges:  Prepandial:   less than 140 mg/dL      Peak postprandial:   less than 180 mg/dL (1-2 hours)      Critically ill patients:  140 - 180 mg/dL   Lab Results  Component Value Date   GLUCAP 167 (H) 08/10/2017   HGBA1C 10.2 (H) 08/08/2017    Spoke with patient in regards to her not taking her insulin regularly. Patient reports that she has prescriptions for her insulin and can't afford it. I asked her if she has tried the [facility] insulin she said she couldn't afford it. Spoke with Manuela Schwartz, RN CM, Patient has medicaid and has a copay of $3-4 for her insulin. Patient was not truthful about her medication copay. Patient was in the room with lights off still with abd pain. Did not stay long as patient did not have a good attention span.  Thanks,  Tama Headings RN, MSN, BC-ADM, [facility] Inpatient Diabetes Coordinator Team Pager [phone] (8a-5p)

## 2017-08-11 DIAGNOSIS — R000 Tachycardia, unspecified: Secondary | ICD-10-CM

## 2017-08-11 LAB — GLUCOSE, CAPILLARY
GLUCOSE-CAPILLARY: 148 mg/dL — AB (ref 65–99)
GLUCOSE-CAPILLARY: 180 mg/dL — AB (ref 65–99)
Glucose-Capillary: 111 mg/dL — ABNORMAL HIGH (ref 65–99)
Glucose-Capillary: 253 mg/dL — ABNORMAL HIGH (ref 65–99)

## 2017-08-11 LAB — CBC
HEMATOCRIT: 39.6 % (ref 36.0–46.0)
Hemoglobin: 13.8 g/dL (ref 12.0–15.0)
MCH: 26.5 pg (ref 26.0–34.0)
MCHC: 34.8 g/dL (ref 30.0–36.0)
MCV: 76.2 fL — ABNORMAL LOW (ref 78.0–100.0)
Platelets: 453 10*3/uL — ABNORMAL HIGH (ref 150–400)
RBC: 5.2 MIL/uL — ABNORMAL HIGH (ref 3.87–5.11)
RDW: 15.8 % — AB (ref 11.5–15.5)
WBC: 12.3 10*3/uL — ABNORMAL HIGH (ref 4.0–10.5)

## 2017-08-11 LAB — BASIC METABOLIC PANEL
Anion gap: 9 (ref 5–15)
BUN: 18 mg/dL (ref 6–20)
CO2: 25 mmol/L (ref 22–32)
CREATININE: 1.01 mg/dL — AB (ref 0.44–1.00)
Calcium: 9.3 mg/dL (ref 8.9–10.3)
Chloride: 98 mmol/L — ABNORMAL LOW (ref 101–111)
GFR calc non Af Amer: >60 mL/min (ref >60)
Glucose, Bld: 161 mg/dL — ABNORMAL HIGH (ref 65–99)
Potassium: 3.8 mmol/L (ref 3.5–5.1)
SODIUM: 132 mmol/L — AB (ref 135–145)

## 2017-08-11 MED ORDER — DIPHENHYDRAMINE HCL 50 MG/ML IJ SOLN
12.5000 mg | Freq: Three times a day (TID) | INTRAMUSCULAR | Status: DC | PRN
  Filled 2017-08-11: qty 1

## 2017-08-11 MED ORDER — WHITE PETROLATUM EX OINT
TOPICAL_OINTMENT | CUTANEOUS | Status: AC
  Administered 2017-08-11: 1
  Filled 2017-08-11: qty 28.35

## 2017-08-11 MED ORDER — KETOROLAC TROMETHAMINE 15 MG/ML IJ SOLN
15.0000 mg | Freq: Four times a day (QID) | INTRAMUSCULAR | Status: DC | PRN
  Administered 2017-08-11 – 2017-08-12 (×2): 15 mg via INTRAVENOUS
  Filled 2017-08-11 (×2): qty 1

## 2017-08-11 MED ORDER — SODIUM CHLORIDE 0.9 % IV SOLN
INTRAVENOUS | Status: DC
  Administered 2017-08-11: 14:00:00 via INTRAVENOUS

## 2017-08-11 NOTE — Progress Notes (Signed)
Patient refusing to take bath, brush teeth, and eat food. RN educated patient on the importance of good hygiene and eating. Patient states last vomited yesterday morning (4/19). MD aware of situation.

## 2017-08-11 NOTE — Progress Notes (Signed)
Patient taking sips of fluids, but eating very Ezekiel on lunch tray

## 2017-08-11 NOTE — Progress Notes (Signed)
Patient refused bath and oral care

## 2017-08-11 NOTE — Progress Notes (Signed)
   Subjective: patient was evaluated this morning on rounds. She reports continued abdominal pain however stable. She states she has been able to take sips of fluids. She denies vomiting overnight.  Objective:  Vital signs in last 24 hours: Vitals:   08/10/17 1951 08/11/17 0444 08/11/17 0821 08/11/17 0844  BP: (!) 181/127 (!) 133/108 (!) 160/62   Pulse: (!) 130 (!) 159 (!) 110   Resp:      Temp: 98.2 F (36.8 C) 98.2 F (36.8 C) 98.3 F (36.8 C)   TempSrc: Oral Oral Oral   SpO2: 100% 99%  100%  Weight:  198 lb 6.6 oz (90 kg)    Height:       Physical Exam  Cardiovascular: Regular rhythm. Exam reveals no gallop and no friction rub.  No murmur heard. Tachycardic  Pulmonary/Chest: Breath sounds normal. No respiratory distress. She has no wheezes. She has no rales.  Abdominal: Soft. Bowel sounds are normal. She exhibits no distension. There is tenderness.  Skin: Skin is warm and dry.     Assessment/Plan:  Active Problems:   Intractable nausea and vomiting  Intractable nausea and vomiting with history of gastroparesis Patient continues to have left-sided abdominal pain with nausea. She denies any vomiting overnight.  Patient has remained afebrile during admission. White blood cell count has dropped from 18 to 12.  She is able to tolerate some fluids and able to take her oral medications.  At this time will continue with supportive care using IV fluids, phenergan, toradol, benadryl and avoiding opioids. -IV normal saline @ 75 cc/hr -Phenergan - benadryl IV  -toradol  Hypertension Blood pressure labile. Systolics range 629B to 284X. She is on her home hydrochlorothiazide and metoprolol. She has hydralazine when necessary -Continue Hydrochlorothiazide and metoprolol  -Hydralazine PRN  Sinus tachycardia Tachycardia is improving currently in the 110s. Attributing to pain, dehydration and not taking her metoprolol 100 mg twice daily for the past couple days. -continue meds  above  Uncontrolled type 2 diabetes Hemoglobin A1c this admission was 10.2.  Blood glucose this admission has been well controlled on sliding scale alone, probably because patient not tolerating oral intake.   -SSI   Dispo: Anticipated discharge pending clinical improvement.   Kalman Shan [place], DO 08/11/2017, 11:15 AM Pager: [phone]

## 2017-08-12 LAB — CBC
HCT: 35.5 % — ABNORMAL LOW (ref 36.0–46.0)
Hemoglobin: 11.9 g/dL — ABNORMAL LOW (ref 12.0–15.0)
MCH: 25.6 pg — ABNORMAL LOW (ref 26.0–34.0)
MCHC: 33.5 g/dL (ref 30.0–36.0)
MCV: 76.3 fL — ABNORMAL LOW (ref 78.0–100.0)
Platelets: 398 10*3/uL (ref 150–400)
RBC: 4.65 MIL/uL (ref 3.87–5.11)
RDW: 15.8 % — ABNORMAL HIGH (ref 11.5–15.5)
WBC: 9.9 10*3/uL (ref 4.0–10.5)

## 2017-08-12 LAB — GLUCOSE, CAPILLARY
GLUCOSE-CAPILLARY: 360 mg/dL — AB (ref 65–99)
GLUCOSE-CAPILLARY: 196 mg/dL — AB (ref 65–99)
GLUCOSE-CAPILLARY: 194 mg/dL — AB (ref 65–99)
Glucose-Capillary: 332 mg/dL — ABNORMAL HIGH (ref 65–99)

## 2017-08-12 LAB — BASIC METABOLIC PANEL
Anion gap: 11 (ref 5–15)
BUN: 32 mg/dL — ABNORMAL HIGH (ref 6–20)
CO2: 24 mmol/L (ref 22–32)
Calcium: 8.7 mg/dL — ABNORMAL LOW (ref 8.9–10.3)
Chloride: 98 mmol/L — ABNORMAL LOW (ref 101–111)
Creatinine, Ser: 1.69 mg/dL — ABNORMAL HIGH (ref 0.44–1.00)
GFR calc Af Amer: 42 mL/min — ABNORMAL LOW (ref >60)
GFR calc non Af Amer: 36 mL/min — ABNORMAL LOW (ref >60)
Glucose, Bld: 354 mg/dL — ABNORMAL HIGH (ref 65–99)
Potassium: 3.7 mmol/L (ref 3.5–5.1)
Sodium: 133 mmol/L — ABNORMAL LOW (ref 135–145)

## 2017-08-12 MED ORDER — PROMETHAZINE HCL 25 MG PO TABS
25.0000 mg | ORAL_TABLET | Freq: Four times a day (QID) | ORAL | Status: DC | PRN
  Administered 2017-08-12 – 2017-08-13 (×4): 25 mg via ORAL
  Filled 2017-08-12 (×4): qty 1

## 2017-08-12 MED ORDER — INSULIN ASPART 100 UNIT/ML ~~LOC~~ SOLN: 0.0000 [IU] | Freq: Every day | SUBCUTANEOUS | Status: DC

## 2017-08-12 MED ORDER — APIXABAN 5 MG PO TABS
5.0000 mg | ORAL_TABLET | Freq: Two times a day (BID) | ORAL | Status: DC
  Administered 2017-08-13: 5 mg via ORAL
  Filled 2017-08-12: qty 1

## 2017-08-12 MED ORDER — DIPHENHYDRAMINE HCL 25 MG PO CAPS
25.0000 mg | ORAL_CAPSULE | Freq: Four times a day (QID) | ORAL | Status: DC | PRN
  Administered 2017-08-12 – 2017-08-13 (×2): 25 mg via ORAL
  Filled 2017-08-12 (×2): qty 1

## 2017-08-12 MED ORDER — INSULIN ASPART 100 UNIT/ML ~~LOC~~ SOLN
0.0000 [IU] | Freq: Three times a day (TID) | SUBCUTANEOUS | Status: DC
  Administered 2017-08-12: 3 [IU] via SUBCUTANEOUS
  Administered 2017-08-12: 11 [IU] via SUBCUTANEOUS
  Administered 2017-08-13: 8 [IU] via SUBCUTANEOUS
  Administered 2017-08-13: 11 [IU] via SUBCUTANEOUS
  Administered 2017-08-13: 5 [IU] via SUBCUTANEOUS

## 2017-08-12 NOTE — Progress Notes (Signed)
   Subjective: No acute events overnight, she states her abdominal pain has improved but still present and varies in intensity. She has taken small amounts of solid food, no emesis but notes burping.   Objective:  Vital signs in last 24 hours: Vitals:   08/11/17 0844 08/11/17 1744 08/11/17 2009 08/12/17 0422  BP:  (!) 133/92 96/67 (!) 133/97  Pulse:  95 (!) 102 (!) 101  Resp:      Temp:  98.2 F (36.8 C) 98.1 F (36.7 C) 97.6 F (36.4 C)  TempSrc:  Oral Oral Axillary  SpO2: 100% 100% 100% 100%  Weight:    203 lb 7.8 oz (92.3 kg)  Height:       General: Laying comfortably in bed, no acute distress  CV: Improved rate, regular rhythm, no murmur appreciated Resp: Clear breath sounds, normal work of breathing, no distress  Abd: Soft, obese,  +BS, minimal tenderness to palpation, worse in lower abdomen- improved from prior  Extr: No LE edema  Neuro: Alert, answering appropriately, following commands  Skin: Warm, dry      Assessment/Plan:  Abdominal Pain, Nausea and Vomiting, H/o Gastroparesis  Presenting with left sided abdominal pain with associated nausea and vomiting, most likely due to her history of diabetic gastroparesis and similar presentations in the past. Pain has slowly improved with anti-emetics, IVF, and slow advancement of diet. HR has improved, tolerating more oral intake. Will continue supportive care.  --CM diet  --DC IVF --Phenergan 25 mg q6hr prn (switch to oral), Benadryl 12.5 mg q8hr prn, Tylenol   Dehydration, Acute Kidney Injury, resolved Baseline creatinine appears to be around 1-1.2, slightly elevated on initial labs at 1.59 likely representing prerenal azotemia with vomiting and decreased intake.  Renal function improved with fluids, slightly up again this am but should be hydrated. May be related to toradol doses-will dc and continue to monitor.  --BMP, replete lytes as indicated   H/o HTN Elevated in setting of medication non-adherence. Initially held  home medications given dehydration and kidney injury, positive cocaine. Have resumed home thiazide and metoprolol.  --Cont home HCTZ, metoprolol, hydral prn  --Hold home arb   H/o DM Blood glucose increasing as diet improves. Will increase SSI correction and continue to up-titrate as needed. Will resume insulin regimen upon discharge, appreciate diabetic coordinator assistance in education regarding low cost insulin option for pt.   Dispo: Anticipated discharge in approximately 1-2 day(s).   Tawny Asal, MD 08/12/2017, 10:24 AM Pager: [phone]

## 2017-08-13 DIAGNOSIS — Z794 Long term (current) use of insulin: Secondary | ICD-10-CM

## 2017-08-13 DIAGNOSIS — Z86718 Personal history of other venous thrombosis and embolism: Secondary | ICD-10-CM

## 2017-08-13 DIAGNOSIS — Z7901 Long term (current) use of anticoagulants: Secondary | ICD-10-CM

## 2017-08-13 LAB — GLUCOSE, CAPILLARY
Glucose-Capillary: 320 mg/dL — ABNORMAL HIGH (ref 65–99)
Glucose-Capillary: 204 mg/dL — ABNORMAL HIGH (ref 65–99)
Glucose-Capillary: 266 mg/dL — ABNORMAL HIGH (ref 65–99)

## 2017-08-13 LAB — BASIC METABOLIC PANEL
Anion gap: 7 (ref 5–15)
BUN: 36 mg/dL — ABNORMAL HIGH (ref 6–20)
CO2: 25 mmol/L (ref 22–32)
Calcium: 8.1 mg/dL — ABNORMAL LOW (ref 8.9–10.3)
Chloride: 100 mmol/L — ABNORMAL LOW (ref 101–111)
Creatinine, Ser: 1.62 mg/dL — ABNORMAL HIGH (ref 0.44–1.00)
GFR calc Af Amer: 44 mL/min — ABNORMAL LOW (ref >60)
GFR calc non Af Amer: 38 mL/min — ABNORMAL LOW (ref >60)
Glucose, Bld: 304 mg/dL — ABNORMAL HIGH (ref 65–99)
Potassium: 3.5 mmol/L (ref 3.5–5.1)
Sodium: 132 mmol/L — ABNORMAL LOW (ref 135–145)

## 2017-08-13 MED ORDER — PROMETHAZINE HCL 25 MG PO TABS: 25.0000 mg | ORAL_TABLET | Freq: Four times a day (QID) | ORAL | 0 refills | Status: DC | PRN

## 2017-08-13 MED ORDER — INSULIN GLARGINE 100 UNIT/ML ~~LOC~~ SOLN
20.0000 [IU] | Freq: Once | SUBCUTANEOUS | Status: AC
  Administered 2017-08-13: 20 [IU] via SUBCUTANEOUS
  Filled 2017-08-13 (×2): qty 0.2

## 2017-08-13 MED ORDER — ACETAMINOPHEN 325 MG PO TABS
325.0000 mg | ORAL_TABLET | Freq: Once | ORAL | Status: AC
  Administered 2017-08-13: 325 mg via ORAL

## 2017-08-13 MED ORDER — HYDROCHLOROTHIAZIDE 25 MG PO TABS: 25.0000 mg | ORAL_TABLET | Freq: Every day | ORAL | 0 refills | Status: DC

## 2017-08-13 MED ORDER — APIXABAN 5 MG PO TABS: 5.0000 mg | ORAL_TABLET | Freq: Two times a day (BID) | ORAL | 0 refills | Status: DC

## 2017-08-13 MED ORDER — METOPROLOL TARTRATE 100 MG PO TABS: 100.0000 mg | ORAL_TABLET | Freq: Two times a day (BID) | ORAL | 0 refills | Status: DC

## 2017-08-13 MED ORDER — INSULIN NPH ISOPHANE & REGULAR (70-30) 100 UNIT/ML ~~LOC~~ SUSP: SUBCUTANEOUS | 3 refills | Status: DC

## 2017-08-13 MED ORDER — "INSULIN SYRINGE 28G X 1/2"" 0.5 ML MISC": 11 refills | Status: DC

## 2017-08-13 NOTE — Progress Notes (Signed)
Inpatient Diabetes Program Recommendations  AACE/ADA: New Consensus Statement on Inpatient Glycemic Control (2015)  Target Ranges:  Prepandial:   less than 140 mg/dL      Peak postprandial:   less than 180 mg/dL (1-2 hours)      Critically ill patients:  140 - 180 mg/dL   Results for DEMITRA, DANLEY (MRN [id number]) as of 08/13/2017 12:00  Ref. Range 08/12/2017 06:20 08/12/2017 12:20 08/12/2017 16:21 08/12/2017 20:10  Glucose-Capillary Latest Ref Range: 65 - 99 mg/dL 360 (H)  9 units NOVOLOG  332 (H)  11 units NOVOLOG  196 (H)  3 units NOVOLOG  194 (H)   Results for CORALINE, TALWAR (MRN [id number]) as of 08/13/2017 12:00  Ref. Range 08/13/2017 06:52 08/13/2017 11:34  Glucose-Capillary Latest Ref Range: 65 - 99 mg/dL 320 (H)  11 units NOVOLOG  204 (H)    Home DM Meds: 70/30 Insulin- 50 units BID (NOT taking per pt report)  Current Insulin Orders: Novolog Moderate Correction Scale/ SSI (0-15 units) TID AC + HS      Per review of notes, pt stated she cannot afford her insulin, however, patient has Medicaid coverage and should be able to get her insulin for $3 co-pay (per Care Management).  CBGs elevated all day yesterday and again today.  Likely needs basal insulin to help better control CBGs in hospital.  Not eating well enough to start 70/30 insulin back.    MD- Please consider starting a weight based dose of basal insulin while patient hospitalized.  Can resume 70/30 Insulin at time of discharge if desired.  Recommend Lantus 20 units daily (0.2 units/kg dosing based on weight of 97 kg)      --Will follow patient during hospitalization--  Wyn Quaker RN, MSN, CDE Diabetes Coordinator Inpatient Glycemic Control Team Team Pager: [phone] (8a-5p)

## 2017-08-13 NOTE — Progress Notes (Addendum)
Pt given discharge instructions and prescriptions. Instructions gone over with her and answered all questions to satisfaction. Reinforced importance of taking medications as prescribed. Pt is calling ride to pick her up. Pt in no distress at time of discharge.   Pt unable to find ride at this time. RN contacted SW and received taxi voucher for pt to get home.

## 2017-08-13 NOTE — Progress Notes (Signed)
   Subjective: No acute events overnight, her abdominal pain is continued to improve with better p.o. intake.  No recurrent vomiting.  Educated on importance of continuing regular medications upon discharge.  Objective:  Vital signs in last 24 hours: Vitals:   08/12/17 2232 08/13/17 0020 08/13/17 0412 08/13/17 1137  BP: (!) 89/60 102/70 112/82 97/66  Pulse:   (!) 102 93  Resp:   16 16  Temp:   97.8 F (36.6 C) 97.8 F (36.6 C)  TempSrc:   Oral Oral  SpO2:   100% 97%  Weight:   214 lb 4.6 oz (97.2 kg)   Height:       General: Laying comfortably in bed, no acute distress  CV: Regular rate and rhythm, regular rhythm, no murmur appreciated Resp: Clear breath sounds, normal work of breathing, no distress  Abd: Soft, obese,  +BS, minimal tenderness to palpation which is improved from prior  Extr: No LE edema  Neuro: Alert, answering appropriately, following commands  Skin: Warm, dry      Assessment/Plan:  Abdominal Pain, Nausea and Vomiting, H/o Gastroparesis  Presenting with left sided abdominal pain with associated nausea and vomiting, most likely due to her history of diabetic gastroparesis and similar presentations in the past. Pain has slowly improved with anti-emetics, IVF, and slow advancement of diet. HR has improved, tolerating more oral intake. Will continue supportive care.  --CM diet  --PO Phenergan 25 mg q6hr prn, Benadryl 12.5 mg q8hr prn, Tylenol   Dehydration, Acute Kidney Injury, resolved Baseline creatinine appears to be around 1-1.2, slightly elevated on initial labs at 1.59 likely representing prerenal azotemia with vomiting and decreased intake.  Renal function improved with fluids, slightly up again but should be re-hydrated based on amount of IVF. May be related to toradol doses previously, stable again this am. Oral intake has continued to improve and maintaining PO hydration.   --BMP, replete lytes as indicated   H/o HTN Elevated in setting of medication  non-adherence. Initially held home medications given dehydration and kidney injury, positive cocaine. Have resumed home thiazide and metoprolol.  --Cont home HCTZ, metoprolol, hydral prn  --Hold home arb   H/o DM Blood glucose increasing as diet improves. Will increase SSI correction and continue to up-titrate as needed including basal dose today. Will resume insulin regimen upon discharge, appreciate diabetic coordinator assistance in education regarding low cost insulin option for pt.   Dispo: Anticipated discharge in approximately 0-1 day(s).   Tawny Asal, MD 08/13/2017, 2:57 PM Pager: [phone]

## 2017-08-13 NOTE — Progress Notes (Signed)
CSW provided taxi voucher for pt to return to house in [place].  Wendelyn Breslow, [facility]  [phone]

## 2017-08-13 NOTE — Progress Notes (Signed)
Pt has been complaining of headache. Pt states Tylenol is not helping. Paged [place] for pt. Pt states pain is a 10 feels like migraine.

## 2017-08-15 NOTE — Discharge Summary (Signed)
Name: Shirley [name] MRN: [id number] DOB: [birth year] 42 y.o. PCP: Tawny Asal  Date of Admission: 08/08/2017 12:54 AM Date of Discharge: 08/13/2017 Attending Physician: Dr. Aldine Contes   Discharge Diagnosis: Principal Problem:   Intractable nausea and vomiting Active Problems:   Poorly controlled type 2 diabetes mellitus with peripheral neuropathy ([place])   OBESITY, MORBID   Essential hypertension   Discharge Medications: Allergies as of 08/13/2017      Reactions   Lisinopril Nausea Only, Swelling   Morphine And Related Itching, Swelling      Medication List    STOP taking these medications   cyclobenzaprine 10 MG tablet Commonly known as:  FLEXERIL   erythromycin 250 MG tablet Commonly known as:  E-MYCIN   losartan 50 MG tablet Commonly known as:  COZAAR   ondansetron 4 MG tablet Commonly known as:  ZOFRAN     TAKE these medications   albuterol 108 (90 Base) MCG/ACT inhaler Commonly known as:  PROVENTIL HFA;VENTOLIN HFA Inhale 2 puffs into the lungs every 4 (four) hours as needed for wheezing or shortness of breath.   apixaban 5 MG Tabs tablet Commonly known as:  ELIQUIS Take 1 tablet (5 mg total) by mouth 2 (two) times daily.   freestyle lancets Use as instructed   glucose blood test strip Commonly known as:  FREESTYLE TEST STRIPS Use as instructed   glucose monitoring kit monitoring kit 1 each by Does not apply route 4 (four) times daily - after meals and at bedtime. 1 month Diabetic Testing Supplies for QAC-QHS accuchecks.   hydrochlorothiazide 25 MG tablet Commonly known as:  HYDRODIURIL Take 1 tablet (25 mg total) by mouth daily. Take on tablet in the morning.   insulin NPH-regular Human (70-30) 100 UNIT/ML injection Commonly known as:  NOVOLIN 70/30 50 units in the morning and 50 units in the evening.   INSULIN SYRINGE .5CC/28G 28G X 1/2" 0.5 ML Misc Commonly known as:  INS SYRINGE/NEEDLE .5CC/28G Please provide 1 month  supply   metoprolol tartrate 100 MG tablet Commonly known as:  LOPRESSOR Take 1 tablet (100 mg total) by mouth 2 (two) times daily.   promethazine 25 MG tablet Commonly known as:  PHENERGAN Take 1 tablet (25 mg total) by mouth every 6 (six) hours as needed for nausea or vomiting. What changed:  Another medication with the same name was removed. Continue taking this medication, and follow the directions you see here.       Disposition and follow-up:   Shirley [name] was discharged from [facility] in Stable condition.  At the hospital follow up visit please address:  1.  -Assess adherence to medication regimen -Ensure continued improvement/resolution of abdominal pain, nausea, vomiting. Consider gastric emptying study to confirm gastroparesis diet. Consider addition of other agents if pt has been taking medications   -Check BMP to ensure Cr has returned to baseline  -Check BP and consider re-addition of ARB or other agents to achieve better control  -Assess adherence to insulin regimen   2.  Labs / imaging needed at time of follow-up: BMP  3.  Pending labs/ test needing follow-up: None   Follow-up Appointments: Follow-up Information    Clent Demark, PA-C. Schedule an appointment as soon as possible for a visit.   Specialty:  Physician Assistant Contact information: [place] [address] [place] Hospital Course by problem list:  Abdominal Pain,  Nausea/Vomiting, H/o Gastroparesis Patient presented with left-sided abdominal pain with associated nausea and vomiting which was similar to prior presentations that have been attributed to flare of diabetic gastroparesis.  This is a clinical diagnosis at this point she has not had a formal gastric emptying study.  She was treated supportively with IV fluids, bowel rest with diet advanced as tolerated, and antiemetics (initially included Reglan that was transitioned to  Phenergan per patient request, Benadryl).  She also received Tylenol and several doses of Toradol for adjunctive pain control.  She is tachycardic and uncomfortable, however her abdominal exam was reassuring without guarding, rebound tenderness, or peritoneal signs and imaging was not pursued.  She slowly improved and was tolerating a regular carb modified diet prior to discharge.  Though Reglan is first-line for gastroparesis, she is discharged with a supply of Phenergan as she stated this was more effective for her in the past than Reglan.  Acute Kidney Injury  Baseline creatinine appears to be around 1-1 0.2, and was slightly elevated on initial labs in 1.59.  This was felt to represent prerenal azotemia with her vomiting and decreased intake prior to presentation.  Her renal function improved with IV fluids and return to baseline before bumping following Toradol doses.  Creatinine on discharge was 1.6, she was maintaining oral hydration with p.o. intake and further nephrotoxins were avoided.  Repeat BMP as outpatient recommended to ensure return to baseline as expected.  H/o HTN Elevated on admission in setting of pain, as well as medication nonadherence.  Patient states she had not been taking her medications for several months prior to presentation.  Her home medicines were initially held with dehydration in both kidney injury.  UDS was also positive for cocaine which led to beta-blocker being held and for early part of admission.  As she improved, prior thiazide and metoprolol were resumed which resulted in improvement in her blood pressure and tachycardia.  Her home ARB was not restarted given her renal function noted above.  Rechecking blood pressure and titration of medications if she remains adherent can be performed as an outpatient.  H/o DM A1c on admission 10.2, and patient reported not taking home insulin regimen.  She stated she cannot afford insulin, however diabetes coordinator indicated  patient that her insulin is available at [facility] for $3-4.  She was managed with sliding scale insulin which was increased as her p.o. intake improved.  She was discharged on her prior insulin regimen with education regarding cost and importance of glycemic control.  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  Her Eliquis was resumed upon admission with dose adjustments made for renal function when necessary.  She was discharged with a renewed prescription to continue Eliquis.  Discharge Vitals:   BP 97/66 (BP Location: Left Arm)   Pulse 93   Temp 97.8 F (36.6 C) (Oral)   Resp 16   Ht 5' 7"  (1.702 m)   Wt 214 lb 4.6 oz (97.2 kg)   SpO2 97%   BMI 33.56 kg/m   Pertinent Labs, Studies, and Procedures:  BMP Latest Ref Rng & Units 08/13/2017 08/12/2017 08/11/2017  Glucose 65 - 99 mg/dL 304(H) 354(H) 161(H)  BUN 6 - 20 mg/dL 36(H) 32(H) 18  Creatinine 0.44 - 1.00 mg/dL 1.62(H) 1.69(H) 1.01(H)  BUN/Creat Ratio 9 - 23 - - -  Sodium 135 - 145 mmol/L 132(L) 133(L) 132(L)  Potassium 3.5 - 5.1 mmol/L 3.5 3.7  3.8  Chloride 101 - 111 mmol/L 100(L) 98(L) 98(L)  CO2 22 - 32 mmol/L 25 24 25   Calcium 8.9 - 10.3 mg/dL 8.1(L) 8.7(L) 9.3    Discharge Instructions: Discharge Instructions    Discharge instructions   Complete by:  As directed    I'm glad you're doing better Shirley [name] -Continue to slowly increase your food intake and stay hydrated with water -It is important to re-start some of your medicines for both your gastroparesis and your other health issues -We printed some prescriptions for your insulin, blood pressure medicines, and phenergan. At [place], your insulin and blood pressure medicines should be $3-4. We saw you have also used the [employer] [facility] [employer]. If you have other assistance through them, you can also take the prescriptions there.  -You should make an appointment to see your primary care doctor to see how you  are doing and that you have recovered.   Increase activity slowly   Complete by:  As directed       Signed: Tawny Asal, MD 08/15/2017, 1:02 PM   Pager: [phone]

## 2017-09-01 ENCOUNTER — Encounter (HOSPITAL_COMMUNITY): Payer: Self-pay | Admitting: Emergency Medicine

## 2017-09-01 ENCOUNTER — Inpatient Hospital Stay (HOSPITAL_COMMUNITY)
Admission: EM | Admit: 2017-09-01 | Discharge: 2017-09-04 | DRG: 074 | Disposition: A | Discharge status: Home / Self Care | Payer: Medicaid Other | Attending: Internal Medicine | Admitting: Internal Medicine

## 2017-09-01 DIAGNOSIS — Z885 Allergy status to narcotic agent status: Secondary | ICD-10-CM

## 2017-09-01 DIAGNOSIS — D259 Leiomyoma of uterus, unspecified: Secondary | ICD-10-CM | POA: Diagnosis present

## 2017-09-01 DIAGNOSIS — I10 Essential (primary) hypertension: Secondary | ICD-10-CM | POA: Diagnosis present

## 2017-09-01 DIAGNOSIS — Z955 Presence of coronary angioplasty implant and graft: Secondary | ICD-10-CM

## 2017-09-01 DIAGNOSIS — Z7901 Long term (current) use of anticoagulants: Secondary | ICD-10-CM

## 2017-09-01 DIAGNOSIS — R000 Tachycardia, unspecified: Secondary | ICD-10-CM | POA: Diagnosis present

## 2017-09-01 DIAGNOSIS — E1143 Type 2 diabetes mellitus with diabetic autonomic (poly)neuropathy: Principal | ICD-10-CM | POA: Diagnosis present

## 2017-09-01 DIAGNOSIS — Z888 Allergy status to other drugs, medicaments and biological substances status: Secondary | ICD-10-CM

## 2017-09-01 DIAGNOSIS — Z9119 Patient's noncompliance with other medical treatment and regimen: Secondary | ICD-10-CM

## 2017-09-01 DIAGNOSIS — E1165 Type 2 diabetes mellitus with hyperglycemia: Secondary | ICD-10-CM | POA: Diagnosis present

## 2017-09-01 DIAGNOSIS — E1142 Type 2 diabetes mellitus with diabetic polyneuropathy: Secondary | ICD-10-CM | POA: Diagnosis present

## 2017-09-01 DIAGNOSIS — R1032 Left lower quadrant pain: Secondary | ICD-10-CM | POA: Diagnosis present

## 2017-09-01 DIAGNOSIS — E1169 Type 2 diabetes mellitus with other specified complication: Secondary | ICD-10-CM

## 2017-09-01 DIAGNOSIS — E119 Type 2 diabetes mellitus without complications: Secondary | ICD-10-CM | POA: Diagnosis present

## 2017-09-01 DIAGNOSIS — E785 Hyperlipidemia, unspecified: Secondary | ICD-10-CM | POA: Diagnosis present

## 2017-09-01 DIAGNOSIS — G4733 Obstructive sleep apnea (adult) (pediatric): Secondary | ICD-10-CM | POA: Diagnosis present

## 2017-09-01 DIAGNOSIS — K2970 Gastritis, unspecified, without bleeding: Secondary | ICD-10-CM | POA: Diagnosis present

## 2017-09-01 DIAGNOSIS — Z86718 Personal history of other venous thrombosis and embolism: Secondary | ICD-10-CM

## 2017-09-01 DIAGNOSIS — G8929 Other chronic pain: Secondary | ICD-10-CM | POA: Diagnosis present

## 2017-09-01 DIAGNOSIS — N289 Disorder of kidney and ureter, unspecified: Secondary | ICD-10-CM | POA: Diagnosis present

## 2017-09-01 DIAGNOSIS — K920 Hematemesis: Secondary | ICD-10-CM | POA: Diagnosis present

## 2017-09-01 DIAGNOSIS — M797 Fibromyalgia: Secondary | ICD-10-CM | POA: Diagnosis present

## 2017-09-01 DIAGNOSIS — J45909 Unspecified asthma, uncomplicated: Secondary | ICD-10-CM | POA: Diagnosis present

## 2017-09-01 DIAGNOSIS — B181 Chronic viral hepatitis B without delta-agent: Secondary | ICD-10-CM | POA: Diagnosis present

## 2017-09-01 DIAGNOSIS — Z794 Long term (current) use of insulin: Secondary | ICD-10-CM

## 2017-09-01 DIAGNOSIS — Z862 Personal history of diseases of the blood and blood-forming organs and certain disorders involving the immune mechanism: Secondary | ICD-10-CM

## 2017-09-01 DIAGNOSIS — I252 Old myocardial infarction: Secondary | ICD-10-CM

## 2017-09-01 DIAGNOSIS — K219 Gastro-esophageal reflux disease without esophagitis: Secondary | ICD-10-CM | POA: Diagnosis present

## 2017-09-01 DIAGNOSIS — F14988 Cocaine use, unspecified with other cocaine-induced disorder: Secondary | ICD-10-CM | POA: Diagnosis present

## 2017-09-01 DIAGNOSIS — I493 Ventricular premature depolarization: Secondary | ICD-10-CM | POA: Diagnosis present

## 2017-09-01 DIAGNOSIS — Z8659 Personal history of other mental and behavioral disorders: Secondary | ICD-10-CM

## 2017-09-01 DIAGNOSIS — Z87891 Personal history of nicotine dependence: Secondary | ICD-10-CM

## 2017-09-01 DIAGNOSIS — R319 Hematuria, unspecified: Secondary | ICD-10-CM | POA: Diagnosis present

## 2017-09-01 DIAGNOSIS — Z8673 Personal history of transient ischemic attack (TIA), and cerebral infarction without residual deficits: Secondary | ICD-10-CM

## 2017-09-01 DIAGNOSIS — I2510 Atherosclerotic heart disease of native coronary artery without angina pectoris: Secondary | ICD-10-CM | POA: Diagnosis present

## 2017-09-01 DIAGNOSIS — Z79899 Other long term (current) drug therapy: Secondary | ICD-10-CM

## 2017-09-01 DIAGNOSIS — E860 Dehydration: Secondary | ICD-10-CM | POA: Diagnosis present

## 2017-09-01 DIAGNOSIS — K3184 Gastroparesis: Secondary | ICD-10-CM | POA: Diagnosis present

## 2017-09-01 DIAGNOSIS — R112 Nausea with vomiting, unspecified: Secondary | ICD-10-CM

## 2017-09-01 DIAGNOSIS — N800 Endometriosis of uterus: Secondary | ICD-10-CM | POA: Diagnosis present

## 2017-09-01 LAB — I-STAT ARTERIAL BLOOD GAS, ED
ACID-BASE DEFICIT: 2 mmol/L (ref 0.0–2.0)
Bicarbonate: 21.7 mmol/L (ref 20.0–28.0)
O2 SAT: 98 %
PO2 ART: 102 mmHg (ref 83.0–108.0)
TCO2: 23 mmol/L (ref 22–32)
pCO2 arterial: 33.5 mmHg (ref 32.0–48.0)
pH, Arterial: 7.421 (ref 7.350–7.450)

## 2017-09-01 LAB — CBC
HEMATOCRIT: 41 % (ref 36.0–46.0)
HEMOGLOBIN: 14.3 g/dL (ref 12.0–15.0)
MCH: 27 pg (ref 26.0–34.0)
MCHC: 34.9 g/dL (ref 30.0–36.0)
MCV: 77.4 fL — AB (ref 78.0–100.0)
Platelets: 462 10*3/uL — ABNORMAL HIGH (ref 150–400)
RBC: 5.3 MIL/uL — ABNORMAL HIGH (ref 3.87–5.11)
RDW: 16.1 % — AB (ref 11.5–15.5)
WBC: 15.6 10*3/uL — ABNORMAL HIGH (ref 4.0–10.5)

## 2017-09-01 LAB — URINALYSIS, ROUTINE W REFLEX MICROSCOPIC
Bilirubin Urine: NEGATIVE
Glucose, UA: 150 mg/dL — AB
Ketones, ur: 20 mg/dL — AB
Leukocytes, UA: NEGATIVE
NITRITE: NEGATIVE
RBC / HPF: >50 RBC/hpf — ABNORMAL HIGH (ref 0–5)
Specific Gravity, Urine: 1.03 (ref 1.005–1.030)
pH: 5 (ref 5.0–8.0)

## 2017-09-01 LAB — COMPREHENSIVE METABOLIC PANEL
ALK PHOS: 74 U/L (ref 38–126)
ALT: 18 U/L (ref 14–54)
AST: 46 U/L — AB (ref 15–41)
Albumin: 3.3 g/dL — ABNORMAL LOW (ref 3.5–5.0)
Anion gap: 13 (ref 5–15)
BUN: 20 mg/dL (ref 6–20)
CHLORIDE: 101 mmol/L (ref 101–111)
CO2: 22 mmol/L (ref 22–32)
Calcium: 9.4 mg/dL (ref 8.9–10.3)
Creatinine, Ser: 1.15 mg/dL — ABNORMAL HIGH (ref 0.44–1.00)
GFR, EST NON AFRICAN AMERICAN: 57 mL/min — AB (ref >60)
GLUCOSE: 181 mg/dL — AB (ref 65–99)
Potassium: 5 mmol/L (ref 3.5–5.1)
Sodium: 136 mmol/L (ref 135–145)
TOTAL PROTEIN: 8.5 g/dL — AB (ref 6.5–8.1)
Total Bilirubin: 1.4 mg/dL — ABNORMAL HIGH (ref 0.3–1.2)

## 2017-09-01 LAB — LIPASE, BLOOD: Lipase: 26 U/L (ref 11–51)

## 2017-09-01 LAB — I-STAT BETA HCG BLOOD, ED (MC, WL, AP ONLY)

## 2017-09-01 LAB — BETA-HYDROXYBUTYRIC ACID: BETA-HYDROXYBUTYRIC ACID: 1.41 mmol/L — AB (ref 0.05–0.27)

## 2017-09-01 LAB — CBG MONITORING, ED: GLUCOSE-CAPILLARY: 161 mg/dL — AB (ref 65–99)

## 2017-09-01 MED ORDER — SODIUM CHLORIDE 0.9 % IV SOLN
INTRAVENOUS | Status: DC
  Filled 2017-09-01: qty 1

## 2017-09-01 MED ORDER — SODIUM CHLORIDE 0.9 % IV BOLUS
500.0000 mL | Freq: Once | INTRAVENOUS | Status: AC
  Administered 2017-09-01: 500 mL via INTRAVENOUS

## 2017-09-01 MED ORDER — KETAMINE HCL 10 MG/ML IJ SOLN
0.3000 mg/kg | Freq: Once | INTRAMUSCULAR | Status: AC
  Administered 2017-09-02: 26 mg via INTRAVENOUS
  Filled 2017-09-01: qty 1

## 2017-09-01 MED ORDER — SODIUM CHLORIDE 0.9 % IV BOLUS
1000.0000 mL | Freq: Once | INTRAVENOUS | Status: AC
  Administered 2017-09-01: 1000 mL via INTRAVENOUS

## 2017-09-01 MED ORDER — ONDANSETRON 4 MG PO TBDP
4.0000 mg | ORAL_TABLET | Freq: Once | ORAL | Status: AC | PRN
  Administered 2017-09-01: 4 mg via ORAL
  Filled 2017-09-01: qty 1

## 2017-09-01 MED ORDER — PROMETHAZINE HCL 25 MG/ML IJ SOLN
25.0000 mg | Freq: Once | INTRAMUSCULAR | Status: AC
  Administered 2017-09-01: 25 mg via INTRAVENOUS
  Filled 2017-09-01: qty 1

## 2017-09-01 NOTE — ED Triage Notes (Signed)
Pt reports vomiting and abd pain x2 days, states LLQ abd pain. Reports taking zofran and phenergan with no releif of s/s. Pt states it feels similar to her gastroporesis. No blood in vomit, states bile.

## 2017-09-01 NOTE — ED Provider Notes (Signed)
[facility] EMERGENCY DEPARTMENT Provider Note   CSN: [id number] Arrival date & time: 09/01/17  1904     History   Chief Complaint Chief Complaint  Patient presents with  . Abdominal Pain  . Emesis    HPI Shirley [name] is a 42 y.o. female.  HPI  42 yo female iddm x28 years, gastroparesis , s/p mi and stent, cva, hypertension, fibromyalgia presents s today complaining of nausea and vomiting x 2 days.  o fever or chills.  LLQ abdominal pain, denies pain with urination.  States she is not taking all her meds due to financial inability.  Patient not taking insulin sinche she left hospital.  Patient discharge on 4/22  Past Medical History:  Diagnosis Date  . Abscess of tunica vaginalis    10/09- Abundant S. aureus- sensitive to all abx  . Anxiety   . Blood dyscrasia   . CAD (coronary artery disease) 06/15/2006   s/p Subendocardial MI with PDA angioplasty(no stent) on 06/15/06 and relook  cath 06/19/06 showed patency of site. Cath 12/10- no restenosis or significant CAD progression  . CVA (cerebral vascular accident) ([place]) ~ 02/2014   denies residual on 04/22/2014  . CVA (cerebral vascular accident) [facility])    history of remote right cerebellar infarct noted on head CT at least since 10/2011  . Depression   . Diabetes mellitus type 2, uncontrolled, with complications ([place])   . Fibromyalgia   . Gastritis   . Gastroparesis    secondary to poorly controlled DM, last emptying study performed 01/2010  was normal but may be falsely positive as pt was on reglan  . GERD (gastroesophageal reflux disease)   . Hepatitis B, chronic (HCC)    Hep BeAb+,Hep B cAb+ & Hep BsAg+ (9/06)  . History of pyelonephritis    H/o GrpB Pyelonephritis (9/06) and UTI- 07/11- E.Coli, 12/10- GBS  . Hyperlipidemia   . Hypertension   . Iron deficiency anemia   . Irregular menses    Small ovarian follicles seen on DH(7/41)  . MI (myocardial infarction) ([place]) 05/2006   PDA percutaneous  transluminal coronary angioplasty  . Migraine    "weekly" (04/22/2014)  . N&V (nausea and vomiting)    Chronic. Unclear etiology with multiple admission and ED visits. CT abdomen with and without contrast (02/2011)  showed no acute process. Gastic Emptying scan (01/2010) was normal. Ultrasound of the abdomen was within normal limits. Hepatitis B viral load was undectable. HIV NR. EGD - gastritis, Hpylori + s/p Rx  . Obesity   . OSA (obstructive sleep apnea)    "suppose to wear mask but I don't" (04/22/2014)  . Peripheral neuropathy   . Pneumonia    "this is probably the 2nd or 3rd time I've had pneumonia" (04/22/2014)  . Recurrent boils   . Seasonal asthma   . Substance abuse ([place])   . Thrombocytosis ([place])    Hem/Onc suggested 2/2 chronic hepatits and/or iron deficiency anemia    Patient Active Problem List   Diagnosis Date Noted  . Intractable nausea and vomiting 08/08/2017  . Chronic maxillary sinusitis 01/02/2017  . Acute blood loss anemia 11/13/2016  . Menorrhagia 11/13/2016  . Bilateral pulmonary embolism ([place]) 10/30/2016  . Acute DVT (deep venous thrombosis) ([place]) 10/30/2016  . Type 2 diabetes mellitus with hyperglycemia ([place]) 04/07/2016  . Diabetic gastroparesis ([place]) 04/06/2016  . Dehydration 03/27/2015  . Gastroparesis 11/11/2014  . Hematemesis 10/13/2014  . DKA (diabetic ketoacidoses) ([place]) 10/13/2014  . Tachycardia 07/21/2014  .  Abdominal pain, chronic, left lower quadrant   . Diabetic gastroparesis associated with type 2 diabetes mellitus ([place]) 04/28/2014  . History of Helicobacter pylori infection 04/22/2014  . Vaginal discharge 02/18/2014  . Atypical chest pain 07/22/2013  . Unspecified constipation 07/21/2013  . Tinea corporis 07/21/2013  . Intractable vomiting 04/29/2013  . Dysmenorrhea 04/22/2013  . UTI (urinary tract infection) 07/15/2012  . Headache(784.0) 02/08/2012  . Health care maintenance 01/22/2012  . Chronic hepatitis B ([place]) 03/07/2011  .  History of leukocytosis 04/06/2010  . THROMBOCYTOSIS 04/06/2010  . Polysubstance abuse ([place]) 02/23/2010  . Iron deficiency anemia 11/22/2009  . PERIPHERAL NEUROPATHY 10/01/2009  . Hyperlipidemia 08/30/2009  . Diabetic polyneuropathy ([place]) 08/30/2009  . Hidradenitis (recurrent boils) 07/07/2008  . Depression 12/27/2007  . Abdominal pain, left lower quadrant 11/21/2007  . FIBROMYALGIA 10/30/2007  . BACK PAIN 04/01/2007  . OBSTRUCTIVE SLEEP APNEA 01/17/2007  . ANXIETY DEPRESSION 06/27/2006  . Chronic ischemic heart disease 06/15/2006  . OBESITY, MORBID 05/15/2006  . MIGRAINE HEADACHE 05/15/2006  . Asthma 05/15/2006  . Essential hypertension 01/16/2006  . IRREGULAR MENSTRUATION 01/16/2006  . PEDAL EDEMA 01/16/2006  . Poorly controlled type 2 diabetes mellitus with peripheral neuropathy ([place]) 01/16/1989    Past Surgical History:  Procedure Laterality Date  . CESAREAN SECTION  1997  . CORONARY ANGIOPLASTY WITH STENT PLACEMENT  2008   "2 stents"  . ESOPHAGOGASTRODUODENOSCOPY N/A 04/23/2014   Procedure: ESOPHAGOGASTRODUODENOSCOPY (EGD);  Surgeon: Winfield Cunas., MD;  Location: [facility] ENDOSCOPY;  Service: Endoscopy;  Laterality: N/A;  . IR ANGIOGRAM PELVIS SELECTIVE OR SUPRASELECTIVE  12/07/2016  . IR ANGIOGRAM PELVIS SELECTIVE OR SUPRASELECTIVE  12/07/2016  . IR ANGIOGRAM SELECTIVE EACH ADDITIONAL VESSEL  12/07/2016  . IR ANGIOGRAM SELECTIVE EACH ADDITIONAL VESSEL  12/07/2016  . IR EMBO ARTERIAL NOT HEMORR HEMANG INC GUIDE ROADMAPPING  12/07/2016  . IR RADIOLOGIST EVAL & MGMT  01/09/2017  . IR US GUIDE VASC ACCESS RIGHT  12/07/2016     OB History    Gravida  2   Para  2   Term  2   Preterm      AB      Living        SAB      TAB      Ectopic      Multiple      Live Births  2            Home Medications    Prior to Admission medications   Medication Sig Start Date End Date Taking? Authorizing Provider  albuterol (PROVENTIL HFA;VENTOLIN HFA) 108 (90 Base)  MCG/ACT inhaler Inhale 2 puffs into the lungs every 4 (four) hours as needed for wheezing or shortness of breath. Patient not taking: Reported on 08/09/2017 09/27/16   Clent Demark, PA-C  apixaban (ELIQUIS) 5 MG TABS tablet Take 1 tablet (5 mg total) by mouth 2 (two) times daily. 08/13/17   Tawny Asal, MD  glucose blood (FREESTYLE TEST STRIPS) test strip Use as instructed 09/05/16   Clent Demark, PA-C  glucose monitoring kit (FREESTYLE) monitoring kit 1 each by Does not apply route 4 (four) times daily - after meals and at bedtime. 1 month Diabetic Testing Supplies for QAC-QHS accuchecks. 09/05/16   Clent Demark, PA-C  hydrochlorothiazide (HYDRODIURIL) 25 MG tablet Take 1 tablet (25 mg total) by mouth daily. Take on tablet in the morning. 08/13/17   Tawny Asal, MD  insulin NPH-regular Human (NOVOLIN 70/30) (70-30) 100 UNIT/ML injection 50 units  in the morning and 50 units in the evening. 08/13/17   Tawny Asal, MD  INSULIN SYRINGE .5CC/28G (INS SYRINGE/NEEDLE .5CC/28G) 28G X 1/2" 0.5 ML MISC Please provide 1 month supply 08/13/17   Tawny Asal, MD  Lancets (FREESTYLE) lancets Use as instructed 01/02/17   Clent Demark, PA-C  metoprolol tartrate (LOPRESSOR) 100 MG tablet Take 1 tablet (100 mg total) by mouth 2 (two) times daily. 08/13/17   Tawny Asal, MD  promethazine (PHENERGAN) 25 MG tablet Take 1 tablet (25 mg total) by mouth every 6 (six) hours as needed for nausea or vomiting. 08/13/17   Tawny Asal, MD    Family History Family History  Problem Relation Age of Onset  . Diabetes Father     Social History Social History   Tobacco Use  . Smoking status: Former Smoker    Types: Cigarettes    Last attempt to quit: 04/24/1996    Years since quitting: 21.3  . Smokeless tobacco: Never Used  . Tobacco comment: quit smoking cigarettes age 89  Substance Use Topics  . Alcohol use: No    Alcohol/week: 0.0 oz    Comment: 04/22/2014 "might have a few drinks a month"  .  Drug use: No    Types: Marijuana, Cocaine    Comment: 04/22/2104 "quit drugs ~ 1-2 yr ago"     Allergies   Lisinopril and Morphine and related   Review of Systems Review of Systems   Physical Exam Updated Vital Signs BP (!) 135/105 (BP Location: Right Arm)   Pulse (!) 134   Temp 98.6 F (37 C) (Oral)   Resp (!) 22   Ht 1.702 m (5' 7" )   Wt 86.2 kg (190 lb)   SpO2 100%   BMI 29.76 kg/m   Physical Exam  Constitutional: She is oriented to person, place, and time. She appears well-developed and well-nourished. She appears ill.  Tearful, moans intermittently, shakes right leg  HENT:  Head: Normocephalic and atraumatic.  Mouth/Throat: Oropharynx is clear and moist.  Eyes: Pupils are equal, round, and reactive to light. EOM are normal.  Cardiovascular: Intact distal pulses and normal pulses. Tachycardia present.  Pulmonary/Chest: Effort normal and breath sounds normal.  Abdominal: Soft. She exhibits no mass. There is tenderness. There is no rebound and no guarding. No hernia.  Musculoskeletal: Normal range of motion.  Neurological: She is alert and oriented to person, place, and time.  Skin: Skin is warm. Capillary refill takes less than 2 seconds.  Psychiatric: Her speech is normal. Her mood appears anxious.  Nursing note and vitals reviewed.    ED Treatments / Results  Labs (all labs ordered are listed, but only abnormal results are displayed) Labs Reviewed  COMPREHENSIVE METABOLIC PANEL - Abnormal; Notable for the following components:      Result Value   Glucose, Bld 181 (*)    Creatinine, Ser 1.15 (*)    Total Protein 8.5 (*)    Albumin 3.3 (*)    AST 46 (*)    Total Bilirubin 1.4 (*)    GFR calc non Af Amer 57 (*)    All other components within normal limits  CBC - Abnormal; Notable for the following components:   WBC 15.6 (*)    RBC 5.30 (*)    MCV 77.4 (*)    RDW 16.1 (*)    Platelets 462 (*)    All other components within normal limits  URINALYSIS,  ROUTINE W REFLEX MICROSCOPIC - Abnormal; Notable for the following  components:   Color, Urine AMBER (*)    APPearance CLOUDY (*)    Glucose, UA 150 (*)    Hgb urine dipstick LARGE (*)    Ketones, ur 20 (*)    Protein, ur >=300 (*)    RBC / HPF >50 (*)    Bacteria, UA RARE (*)    All other components within normal limits  LIPASE, BLOOD  I-STAT BETA HCG BLOOD, ED ([facility], [facility], [facility] ONLY)    EKG EKG Interpretation  Date/Time:  Saturday Sep 01 2017 22:38:18 EDT Ventricular Rate:  130 PR Interval:    QRS Duration: 82 QT Interval:  324 QTC Calculation: 477 R Axis:   85 Text Interpretation:  Sinus tachycardia Borderline repolarization abnormality Confirmed by Pattricia Boss [phone]) on 09/01/2017 11:24:37 PM   Radiology No results found.  Procedures Procedures (including critical care time) No anion gap  Medications Ordered in ED Medications  ondansetron (ZOFRAN-ODT) disintegrating tablet 4 mg (4 mg Oral Given 09/01/17 2018)     Initial Impression / Assessment and Plan / ED Course  I have reviewed the triage vital signs and the nursing notes.  Pertinent labs & imaging results that were available during my care of the patient were reviewed by me and considered in my medical decision making (see chart for details).   42 year old female history of insulin-dependent diabetes, gastroparesis, intractable nausea and vomiting who presents today complaining of nausea and vomiting.  She states she has been noncompliant with her medications since last discharge.  Here she is tachycardic.  She is hyperglycemic initially with a blood sugar of 181. Anion gap is 14.8.  She is received some IV fluids and insulin.  He has received a Phenergan and has not been actively vomiting here. Patient is to receive ketamine for pain. Beta hydroxybutyric uric acid and ABG pending. After IV fluids and pain medications will need reevaluation. Patient recently discharged less than 1 month from internal medicine  teaching service. Signed out to Dr. Kathrynn Humble.  Plan if patient is improved she may be discharged to home.  If patient, vomiting and pain unable to be controlled, will need to be readmitted to internal medicine teaching service. Final Clinical Impressions(s) / ED Diagnoses   Final diagnoses:  Nausea and vomiting, intractability of vomiting not specified, unspecified vomiting type    ED Discharge Orders    None       Pattricia Boss, MD 09/01/17 2349

## 2017-09-01 NOTE — ED Provider Notes (Addendum)
  Physical Exam  BP (!) 172/126   Pulse (!) 129   Temp 98.6 F (37 C) (Oral)   Resp 16   Ht 5\' 7"  (1.702 m)   Wt 86.2 kg (190 lb)   SpO2 100%   BMI 29.76 kg/m   Physical Exam  ED Course/Procedures     Procedures  MDM   Assuming care of patient from Dr. Jeanell Sparrow.   Patient in the ED for nausea and vomiting. She has hx of IDDM, non compliant. Workup thus far shows dehydration, tachycardia.  Concerning findings are as following none pH is 7.42 - unlikely DKA. Important pending results are none.  According to Dr. Jeanell Sparrow, plan is to continue with oral challenge. If pt fails po challenge, we will admit.   Patient had no complains, no concerns from the nursing side. Will continue to monitor.  Varney Biles, MD 09/01/17 2347   2:16 AM Patient was reassessed and she was tearful, tachycardic and vomiting.  RN informed that patient was given ketamine and had an adverse reaction. We will give her some Haldol.  Patient might need admission.   Varney Biles, MD 09/02/17 0217  5:06 AM PT continues to be tachycardic and as soon as awake starts dry heaving. Admission request placed,  Unsure what to make of tachycardia. Pt is on eliquis - likely for stroke prevention. At this time I dont think she needs CT PE - but the tachycardia, if persists, admitting team will have to consider working it up further.    Varney Biles, MD 09/02/17 [phone]

## 2017-09-02 DIAGNOSIS — Z885 Allergy status to narcotic agent status: Secondary | ICD-10-CM

## 2017-09-02 DIAGNOSIS — Z888 Allergy status to other drugs, medicaments and biological substances status: Secondary | ICD-10-CM

## 2017-09-02 DIAGNOSIS — I252 Old myocardial infarction: Secondary | ICD-10-CM

## 2017-09-02 DIAGNOSIS — K3184 Gastroparesis: Secondary | ICD-10-CM

## 2017-09-02 DIAGNOSIS — F1490 Cocaine use, unspecified, uncomplicated: Secondary | ICD-10-CM

## 2017-09-02 DIAGNOSIS — Z8673 Personal history of transient ischemic attack (TIA), and cerebral infarction without residual deficits: Secondary | ICD-10-CM

## 2017-09-02 DIAGNOSIS — I10 Essential (primary) hypertension: Secondary | ICD-10-CM

## 2017-09-02 DIAGNOSIS — Z955 Presence of coronary angioplasty implant and graft: Secondary | ICD-10-CM

## 2017-09-02 DIAGNOSIS — M797 Fibromyalgia: Secondary | ICD-10-CM

## 2017-09-02 DIAGNOSIS — E1142 Type 2 diabetes mellitus with diabetic polyneuropathy: Secondary | ICD-10-CM

## 2017-09-02 DIAGNOSIS — R112 Nausea with vomiting, unspecified: Secondary | ICD-10-CM

## 2017-09-02 DIAGNOSIS — Z8742 Personal history of other diseases of the female genital tract: Secondary | ICD-10-CM

## 2017-09-02 DIAGNOSIS — Z79899 Other long term (current) drug therapy: Secondary | ICD-10-CM

## 2017-09-02 DIAGNOSIS — L818 Other specified disorders of pigmentation: Secondary | ICD-10-CM

## 2017-09-02 DIAGNOSIS — R000 Tachycardia, unspecified: Secondary | ICD-10-CM

## 2017-09-02 DIAGNOSIS — I2510 Atherosclerotic heart disease of native coronary artery without angina pectoris: Secondary | ICD-10-CM

## 2017-09-02 DIAGNOSIS — Z87891 Personal history of nicotine dependence: Secondary | ICD-10-CM

## 2017-09-02 DIAGNOSIS — Z833 Family history of diabetes mellitus: Secondary | ICD-10-CM

## 2017-09-02 DIAGNOSIS — Z7901 Long term (current) use of anticoagulants: Secondary | ICD-10-CM

## 2017-09-02 DIAGNOSIS — E1143 Type 2 diabetes mellitus with diabetic autonomic (poly)neuropathy: Principal | ICD-10-CM

## 2017-09-02 DIAGNOSIS — R1032 Left lower quadrant pain: Secondary | ICD-10-CM

## 2017-09-02 DIAGNOSIS — Z86718 Personal history of other venous thrombosis and embolism: Secondary | ICD-10-CM

## 2017-09-02 LAB — RAPID URINE DRUG SCREEN, HOSP PERFORMED
AMPHETAMINES: NOT DETECTED
Amphetamines: NOT DETECTED
BARBITURATES: NOT DETECTED
Barbiturates: NOT DETECTED
Benzodiazepines: NOT DETECTED
Benzodiazepines: NOT DETECTED
Cocaine: POSITIVE — AB
Cocaine: POSITIVE — AB
Opiates: NOT DETECTED
Opiates: NOT DETECTED
TETRAHYDROCANNABINOL: NOT DETECTED
Tetrahydrocannabinol: NOT DETECTED

## 2017-09-02 LAB — CBC
HEMATOCRIT: 36.1 % (ref 36.0–46.0)
HEMOGLOBIN: 12.4 g/dL (ref 12.0–15.0)
MCH: 26.3 pg (ref 26.0–34.0)
MCHC: 34.3 g/dL (ref 30.0–36.0)
MCV: 76.5 fL — ABNORMAL LOW (ref 78.0–100.0)
Platelets: 399 10*3/uL (ref 150–400)
RBC: 4.72 MIL/uL (ref 3.87–5.11)
RDW: 15.9 % — ABNORMAL HIGH (ref 11.5–15.5)
WBC: 17.1 10*3/uL — ABNORMAL HIGH (ref 4.0–10.5)

## 2017-09-02 LAB — BASIC METABOLIC PANEL
Anion gap: 11 (ref 5–15)
BUN: 17 mg/dL (ref 6–20)
CO2: 25 mmol/L (ref 22–32)
Calcium: 9 mg/dL (ref 8.9–10.3)
Chloride: 103 mmol/L (ref 101–111)
Creatinine, Ser: 1.1 mg/dL — ABNORMAL HIGH (ref 0.44–1.00)
GFR calc Af Amer: >60 mL/min (ref >60)
GLUCOSE: 160 mg/dL — AB (ref 65–99)
POTASSIUM: 3.9 mmol/L (ref 3.5–5.1)
Sodium: 139 mmol/L (ref 135–145)

## 2017-09-02 LAB — CBG MONITORING, ED
GLUCOSE-CAPILLARY: 116 mg/dL — AB (ref 65–99)
GLUCOSE-CAPILLARY: 132 mg/dL — AB (ref 65–99)
Glucose-Capillary: 139 mg/dL — ABNORMAL HIGH (ref 65–99)
Glucose-Capillary: 176 mg/dL — ABNORMAL HIGH (ref 65–99)

## 2017-09-02 LAB — MAGNESIUM: Magnesium: 1.8 mg/dL (ref 1.7–2.4)

## 2017-09-02 MED ORDER — METOPROLOL TARTRATE 100 MG PO TABS
100.0000 mg | ORAL_TABLET | Freq: Two times a day (BID) | ORAL | Status: DC
  Administered 2017-09-02 – 2017-09-04 (×5): 100 mg via ORAL
  Filled 2017-09-02 (×2): qty 1
  Filled 2017-09-02: qty 2
  Filled 2017-09-02: qty 1
  Filled 2017-09-02: qty 2

## 2017-09-02 MED ORDER — PROMETHAZINE HCL 25 MG/ML IJ SOLN: 25.0000 mg | Freq: Four times a day (QID) | INTRAMUSCULAR | Status: DC | PRN

## 2017-09-02 MED ORDER — ACETAMINOPHEN 650 MG RE SUPP: 650.0000 mg | Freq: Four times a day (QID) | RECTAL | Status: DC | PRN

## 2017-09-02 MED ORDER — HYDROCHLOROTHIAZIDE 25 MG PO TABS
25.0000 mg | ORAL_TABLET | Freq: Every day | ORAL | Status: DC
  Administered 2017-09-02 – 2017-09-04 (×3): 25 mg via ORAL
  Filled 2017-09-02 (×3): qty 1

## 2017-09-02 MED ORDER — HALOPERIDOL LACTATE 5 MG/ML IJ SOLN
5.0000 mg | Freq: Once | INTRAMUSCULAR | Status: AC
  Administered 2017-09-02: 5 mg via INTRAVENOUS
  Filled 2017-09-02: qty 1

## 2017-09-02 MED ORDER — ONDANSETRON 4 MG PO TBDP
4.0000 mg | ORAL_TABLET | Freq: Three times a day (TID) | ORAL | Status: DC | PRN
  Administered 2017-09-02 – 2017-09-03 (×2): 4 mg via ORAL
  Filled 2017-09-02 (×3): qty 1

## 2017-09-02 MED ORDER — INSULIN ASPART 100 UNIT/ML ~~LOC~~ SOLN
0.0000 [IU] | Freq: Three times a day (TID) | SUBCUTANEOUS | Status: DC
  Administered 2017-09-02: 2 [IU] via SUBCUTANEOUS
  Administered 2017-09-02 – 2017-09-03 (×4): 1 [IU] via SUBCUTANEOUS
  Administered 2017-09-04: 9 [IU] via SUBCUTANEOUS
  Administered 2017-09-04: 2 [IU] via SUBCUTANEOUS
  Administered 2017-09-04: 7 [IU] via SUBCUTANEOUS
  Filled 2017-09-02 (×2): qty 1

## 2017-09-02 MED ORDER — ACETAMINOPHEN 325 MG PO TABS
650.0000 mg | ORAL_TABLET | Freq: Four times a day (QID) | ORAL | Status: DC | PRN
  Administered 2017-09-03 – 2017-09-04 (×6): 650 mg via ORAL
  Filled 2017-09-02 (×6): qty 2

## 2017-09-02 MED ORDER — SUCRALFATE 1 GM/10ML PO SUSP
1.0000 g | Freq: Three times a day (TID) | ORAL | Status: DC
  Administered 2017-09-02 – 2017-09-04 (×10): 1 g via ORAL
  Filled 2017-09-02 (×11): qty 10

## 2017-09-02 MED ORDER — PANTOPRAZOLE SODIUM 40 MG PO TBEC
40.0000 mg | DELAYED_RELEASE_TABLET | Freq: Every day | ORAL | Status: DC
  Administered 2017-09-02 – 2017-09-04 (×3): 40 mg via ORAL
  Filled 2017-09-02 (×3): qty 1

## 2017-09-02 MED ORDER — SODIUM CHLORIDE 0.9 % IV SOLN
INTRAVENOUS | Status: AC
  Administered 2017-09-02 (×2): via INTRAVENOUS

## 2017-09-02 MED ORDER — METOCLOPRAMIDE HCL 5 MG PO TABS
5.0000 mg | ORAL_TABLET | Freq: Three times a day (TID) | ORAL | Status: DC
  Administered 2017-09-02 – 2017-09-04 (×8): 5 mg via ORAL
  Filled 2017-09-02 (×8): qty 1

## 2017-09-02 MED ORDER — APIXABAN 5 MG PO TABS
5.0000 mg | ORAL_TABLET | Freq: Two times a day (BID) | ORAL | Status: DC
  Administered 2017-09-02 – 2017-09-04 (×5): 5 mg via ORAL
  Filled 2017-09-02 (×6): qty 1

## 2017-09-02 NOTE — Progress Notes (Signed)
Patient refused CPAP at this time.

## 2017-09-02 NOTE — Progress Notes (Addendum)
Subjective: Patient sleeping this morning on rounds. Very uncooperative with exam and providing history. Reports recurrence of chronic nausea, vomiting, and left lower quadrant abdominal pain for the past 4 days. Symptoms generally occur with her menstrual cycle which she is currently on but also occur at times when she is not menstruating. She does have periods of time where she is symptom free, that last for a week at a time. She is unable to identify any triggers, denies substance use. No emesis this morning but has not attempted to eat today. Reports ongoing nausea.   Objective:  Vital signs in last 24 hours: Vitals:   09/02/17 0700 09/02/17 0816 09/02/17 0845 09/02/17 0900  BP: (!) 139/95 (!) 138/103  (!) 140/109  Pulse: (!) 104 (!) 119 (!) 113 (!) 118  Resp: 13 20 14 10   Temp:      TempSrc:      SpO2: 100% 100% 100% 100%  Weight:      Height:       Physical Exam Constitutional: Obese, Laying in bed, answering questions with her eyes closed Cardiovascular: RRR, no murmurs, rubs, or gallops.  Pulmonary/Chest: Breathing comfortably on RA, normal RR Abdominal: Soft, distended due to habitus with large panus, tender to palpation over the left lower quadrant. Hypoactive bowel sounds.  Neurological: A&Ox3, CN II - XII grossly intact.  Skin: No rashes or erythema  Psychiatric: Normal mood and affect   Assessment/Plan:  Patient is a 42 yo F with a pmhx for gastroparesis, CAD s/p PCI, CVA, HTN, and fibromyalgia presenting with 4 days of recurrent of chronic nausea, vomiting, and abdominal pain.   Nausea, Vomiting, LLQ Abdominal Pain: Chronic, recurrent, dating back to at least 2011 in our system. Previous work-up without clear etiology. There is some concern for gastroparesis and has been treated for this empirically during prior hospitalizations. She under went a gastric emptying study in 2011 that was normal. However EGD in 2015 revealed retained food contents in the gastric body  concerning for gastroparesis. Esophageal mucosa at that time appeared normal. Biopsies were taken that were negative for H Pylori but demonstrated chronic focally active gastritis. Although patient denies drug use, it is worth noting that her UDS have been intermittently positive for cocaine in the past. It is possible there could be a component of cocaine induced gastritis, however she has experienced episodes for which she was hospitalized and UDS was negative. High on my differential at this time is a gynecologic etiology. She does have known fibroids and endorses symptoms are worse with menstruation. Undiagnosed endometriosis is also a consideration. Again, she has also had episodes of N/V outside of menses that complicate this picture. She does have risk factors for atherosclerotic disease (Hx CAD s/p PCI). But lack of weight loss, melena, and post prandial symptoms argue against mesenteric ischemia. The possibility of vasculitis has been proposed in the past and could be a consideration with her cocaine use. This would require a CTA for evaluation but given her renal dysfunction would not be appropriate at this time. Lastly, a functional nausea & vomiting disorder cannot be excluded. We will treat symptomatically for now for gastroparesis and possibly gastritis.  -- F/u UDS -- Start reglan 5 mg TID w/ meals -- Start empiric Protonix 40 mg QD -- Start Sucralfate 1 g TID AC & QHS -- Continue Zofran prn -- Advance diet as tolerated -- Consider outpatient gyn follow up for further work up   Chronic Intermittent Hematuria: Again dating back to  2011, with multiple UAs showing small, moderate, and large hematuria. Possibly related to menses but in the setting of her abdominal symptoms will need outpatient follow up with UA while not menstruating.  -- Outpatient UA when not menstruating   Tachycardia: ? Beta blocker withdrawal as she has been unable to tolerate her PO meds vs. Cocaine use. Vital signs are  otherwise stable.  -- Resume home metoprolol -- Monitor for now  HTN: BP elevated today -- Resume home HCTZ & Metoprolol   Type II DM: Takes 70/50 Novolin 50 units BID at home. A1c last month was 10.2. -- Hold home regimen  -- Continue SSI-sensitive TID AC while PO intake is decreased  Dispo: Anticipated discharge in approximately 1-2 day(s).   Shirley Ochs, MD 09/02/2017, 10:09 AM Pager: [phone]

## 2017-09-02 NOTE — H&P (Signed)
Date: 09/02/2017               Patient Name:  Shirley [name] MRN: [id number]  DOB: 05/10/1975 Age / Sex: 42 y.o., female   PCP: Clent Demark, PA-C         Medical Service: Internal Medicine Teaching Service         Attending Physician: Dr. Bartholomew Crews, MD    First Contact: Dr. Ronalee Red Pager: [phone]  Second Contact: Dr. Philipp Ovens Pager: [phone]       After Hours (After 5p/  First Contact Pager: [phone]  weekends / holidays): Second Contact Pager: [phone]   Chief Complaint: vomiting  History of Present Illness: The patient was dosed with haldol prior to the evaluation and was oriented but sluggish limiting the thoroughness of the exam. Shirley [name] is a 42 yo F with a PMHx notable for gastroparesis, s/p MI and stent, CVA, HTN, and fibromyalgia who presented to the ED for concerns for nausea, vomiting and abdominal pain. The patient stated that the pain has been present for years but grew acutely worse this past Thursday. Due to the vomiting and persistent pain she decided to seek care today denying acute changes. The pain is 9/10, intermittent, made worse with menstruation monthly and movement. The pain is relieved by sitting still only. She is currently menstruating. She endorses nausea often associated with emesis and occasionally associated with bloody emesis but denying frank blood.   Patient denied chest pain, myalgias, headache, visual changes, fever, chills, dysuria, hematuria, constipation, hematochezia, dark stool, cough or dyspnea.  ED course: UA was remarkable for large Hgb, glucose, ketones 20, rare bacteria, and protein. Lipase 26, Na 136, K 5.0, Cr 1.15, AST/ALT 46/18. CBC with a leukocytosis 15.6, Hgb 14.3, and platelets 462. Beta-hydroxybutyric acid 1.41 and an unremarkable ABG 7.421/33.5/102/21.7.   Meds:  Current Meds  Medication Sig  . albuterol (PROVENTIL HFA;VENTOLIN HFA) 108 (90 Base) MCG/ACT inhaler Inhale 2 puffs into the lungs every 4 (four)  hours as needed for wheezing or shortness of breath.  Marland Kitchen apixaban (ELIQUIS) 5 MG TABS tablet Take 1 tablet (5 mg total) by mouth 2 (two) times daily.  Marland Kitchen glucose blood (FREESTYLE TEST STRIPS) test strip Use as instructed  . glucose monitoring kit (FREESTYLE) monitoring kit 1 each by Does not apply route 4 (four) times daily - after meals and at bedtime. 1 month Diabetic Testing Supplies for QAC-QHS accuchecks.  . hydrochlorothiazide (HYDRODIURIL) 25 MG tablet Take 1 tablet (25 mg total) by mouth daily. Take on tablet in the morning.  . insulin NPH-regular Human (NOVOLIN 70/30) (70-30) 100 UNIT/ML injection 50 units in the morning and 50 units in the evening.  . INSULIN SYRINGE .5CC/28G (INS SYRINGE/NEEDLE .5CC/28G) 28G X 1/2" 0.5 ML MISC Please provide 1 month supply  . Lancets (FREESTYLE) lancets Use as instructed  . metoprolol tartrate (LOPRESSOR) 100 MG tablet Take 1 tablet (100 mg total) by mouth 2 (two) times daily.  . promethazine (PHENERGAN) 25 MG tablet Take 1 tablet (25 mg total) by mouth every 6 (six) hours as needed for nausea or vomiting.   Allergies: Allergies as of 09/01/2017 - Review Complete 09/01/2017  Allergen Reaction Noted  . Lisinopril Nausea Only and Swelling 03/17/2008  . Morphine and related Itching and Swelling 04/15/2013   Past Medical History:  Diagnosis Date  . Abscess of tunica vaginalis    10/09- Abundant S. aureus- sensitive to all abx  . Anxiety   . Blood  dyscrasia   . CAD (coronary artery disease) 06/15/2006   s/p Subendocardial MI with PDA angioplasty(no stent) on 06/15/06 and relook  cath 06/19/06 showed patency of site. Cath 12/10- no restenosis or significant CAD progression  . CVA (cerebral vascular accident) ([place]) ~ 02/2014   denies residual on 04/22/2014  . CVA (cerebral vascular accident) [facility])    history of remote right cerebellar infarct noted on head CT at least since 10/2011  . Depression   . Diabetes mellitus type 2, uncontrolled, with  complications ([place])   . Fibromyalgia   . Gastritis   . Gastroparesis    secondary to poorly controlled DM, last emptying study performed 01/2010  was normal but may be falsely positive as pt was on reglan  . GERD (gastroesophageal reflux disease)   . Hepatitis B, chronic (HCC)    Hep BeAb+,Hep B cAb+ & Hep BsAg+ (9/06)  . History of pyelonephritis    H/o GrpB Pyelonephritis (9/06) and UTI- 07/11- E.Coli, 12/10- GBS  . Hyperlipidemia   . Hypertension   . Iron deficiency anemia   . Irregular menses    Small ovarian follicles seen on EX(5/28)  . MI (myocardial infarction) ([place]) 05/2006   PDA percutaneous transluminal coronary angioplasty  . Migraine    "weekly" (04/22/2014)  . N&V (nausea and vomiting)    Chronic. Unclear etiology with multiple admission and ED visits. CT abdomen with and without contrast (02/2011)  showed no acute process. Gastic Emptying scan (01/2010) was normal. Ultrasound of the abdomen was within normal limits. Hepatitis B viral load was undectable. HIV NR. EGD - gastritis, Hpylori + s/p Rx  . Obesity   . OSA (obstructive sleep apnea)    "suppose to wear mask but I don't" (04/22/2014)  . Peripheral neuropathy   . Pneumonia    "this is probably the 2nd or 3rd time I've had pneumonia" (04/22/2014)  . Recurrent boils   . Seasonal asthma   . Substance abuse ([place])   . Thrombocytosis (HCC)    Hem/Onc suggested 2/2 chronic hepatits and/or iron deficiency anemia   Family History:  Family History  Problem Relation Age of Onset  . Diabetes Father    Social History:  Social History   Tobacco Use  . Smoking status: Former Smoker    Types: Cigarettes    Last attempt to quit: 04/24/1996    Years since quitting: 21.3  . Smokeless tobacco: Never Used  . Tobacco comment: quit smoking cigarettes age 4  Substance Use Topics  . Alcohol use: No    Alcohol/week: 0.0 oz    Comment: 04/22/2014 "might have a few drinks a month"  . Drug use: No    Types: Marijuana,  Cocaine    Comment: 04/22/2104 "quit drugs ~ 1-2 yr ago"   Review of Systems: A complete ROS was negative except as per HPI.   Physical Exam: Blood pressure (!) 135/97, pulse (!) 110, temperature 98.6 F (37 C), temperature source Oral, resp. rate 13, height 5' 7" (1.702 m), weight 190 lb (86.2 kg), SpO2 100 %. Physical Exam  Constitutional: She is oriented to person, place, and time. She appears well-developed and well-nourished.  HENT:  Head: Normocephalic and atraumatic.  Eyes: Pupils are equal, round, and reactive to light. Conjunctivae are normal.  Neck: Normal range of motion. Neck supple.  Cardiovascular: Normal pulses. An irregular rhythm present. Tachycardia present.  No murmur heard. Premature beats  Pulmonary/Chest: Effort normal and breath sounds normal. No stridor. No respiratory distress.  Abdominal:  Soft. Bowel sounds are normal. She exhibits no distension. There is tenderness in the left upper quadrant. There is no rebound and no guarding.  Musculoskeletal: She exhibits no edema or tenderness.  Neurological: She is alert and oriented to person, place, and time.  Skin: Skin is warm. Capillary refill takes less than 2 seconds. She is not diaphoretic.  Psychiatric: She has a normal mood and affect. Her speech is delayed.   EKG: personally reviewed my interpretation is sinus tachycardia with possible Q-waves in leads III, aVF  CXR: personally reviewed my interpretation is n/a  Assessment & Plan by Problem: Principal Problem:   Nausea and vomiting Active Problems:   Poorly controlled type 2 diabetes mellitus with peripheral neuropathy (HCC)   Essential hypertension   Abdominal pain, left lower quadrant  Assessment: Shirley [name] is a 42 yo F with a PMHx notable for gastroparesis, s/p MI and stent, CVA, HTN, and fibromyalgia who presented to the ED for concerns for nausea, vomiting and abdominal pain. Uncertain etiology for the vomiting and abdominal pain. Possibly  associated with gastroparesis given her history of diabetes and the chronicity of her symptoms. Also of concern is her history or uterine fibroids. Differential includes, peptic ulcer, autoimmune gastritis, malignancy, and infectious causes as well as far less likely a psychosomatic source.   Plan: Nausea and Vomiting: Uncertain etiology but likely at least partially related to her uterine fibroids given the increased severity with her menstrual cycles. The pain and vomiting is nonspecific but responded well to benadryl and promethazine on her previous admission. Electrolytes appear stable despite persistent vomiting.  -promethazine 11m Q6 hours PRN for nausea/vomiting -Consider pelvic ultrasound vs CT given the chronicity of the symptoms but was recently imaged with a CT in 12/2016 demonstrating the uterine fibroid  -Repeat CBC -Repeat BMP and mag  Tachycardia: Tachycardic on prior admission but utilizing cocaine at that time. Also secondary to betablocker withdraw due to the inability to tolerate PO medications. Likely a component here as well. Tele review revealed occasional PVC's but no other consistent pattern.  -Telemetry continuous  -Continue Metoprolol 100 BID when stable but consider cocaine induced prior  HTN: NO acute issues today. We will hold her antihypertensives at this time until her nausea/vomiting improves. BP currently 135/97.  -Holding HCTZ and metoprolol tartrate  DMII: Most recent A1c on 08/08/2017 of 10.2%. There were mild ketones in the urine with a slightly positive Beta-hydroxybutyric acid to 1.41.  -sensitive SSI with CBG with meals  Diet: Clears Code: prior Fluids: 1056mhr VTE ppx: continue home apixaban Dispo: Admit patient to Inpatient with expected length of stay greater than 2 midnights.  Signed: HaKathi LudwigMD 09/02/2017, 6:44 AM  Pager: Pager# [phone]

## 2017-09-02 NOTE — ED Notes (Signed)
Admitting MDs at bedside.

## 2017-09-03 ENCOUNTER — Other Ambulatory Visit: Payer: Self-pay

## 2017-09-03 DIAGNOSIS — N029 Recurrent and persistent hematuria with unspecified morphologic changes: Secondary | ICD-10-CM

## 2017-09-03 DIAGNOSIS — Z794 Long term (current) use of insulin: Secondary | ICD-10-CM

## 2017-09-03 DIAGNOSIS — D259 Leiomyoma of uterus, unspecified: Secondary | ICD-10-CM

## 2017-09-03 DIAGNOSIS — G8929 Other chronic pain: Secondary | ICD-10-CM

## 2017-09-03 LAB — GLUCOSE, CAPILLARY
GLUCOSE-CAPILLARY: 145 mg/dL — AB (ref 65–99)
Glucose-Capillary: 146 mg/dL — ABNORMAL HIGH (ref 65–99)
Glucose-Capillary: 127 mg/dL — ABNORMAL HIGH (ref 65–99)
Glucose-Capillary: 204 mg/dL — ABNORMAL HIGH (ref 65–99)

## 2017-09-03 MED ORDER — GLUCERNA SHAKE PO LIQD: 237.0000 mL | Freq: Three times a day (TID) | ORAL | Status: DC

## 2017-09-03 MED ORDER — PROMETHAZINE HCL 25 MG/ML IJ SOLN
12.5000 mg | Freq: Once | INTRAMUSCULAR | Status: AC
  Administered 2017-09-03: 12.5 mg via INTRAVENOUS
  Filled 2017-09-03: qty 1

## 2017-09-03 MED ORDER — PROMETHAZINE HCL 25 MG/ML IJ SOLN
12.5000 mg | Freq: Four times a day (QID) | INTRAMUSCULAR | Status: AC | PRN
  Administered 2017-09-03 – 2017-09-04 (×2): 12.5 mg via INTRAVENOUS
  Filled 2017-09-03 (×2): qty 1

## 2017-09-03 NOTE — Progress Notes (Signed)
Patient requested if she can have Phenergan instead Zofran stating that Phenergan only helps. Paged IM resident on call, awaiting on call back.   Shirley [name]

## 2017-09-03 NOTE — Progress Notes (Signed)
RN went to get patient from [facility] (ED overflow), patient rates stomach pain 8/10. Patient oriented to unit updated on plan of care, safety precautions in place. VSS.

## 2017-09-03 NOTE — Progress Notes (Signed)
MD called to inquire about patient tolerating lunch. Patient ate graham crackers and requested for her diet to be increased from liquids to solids.  Pt placed on diet but did not eat meal for lunch.  Patient has been in room, ambulated to bathroom.  No request made for antiemetic or pain meds this PM.

## 2017-09-03 NOTE — Progress Notes (Addendum)
Subjective:  Patient reports continued LLQ abdominal pain that is not alleviated by anything. Denies nausea or emesis. Instead, she states she feels hungry and that she has not eaten anything since Wednesday. She informed us that she has seen GYN and they recommend hysterectomy for symptomatic uterine fibroid - she states she was initially hesitant about this, however is now considering this more seriously because of the continued pain. Discussed with her that her chronic N/V and abdominal pain is likely multifactorial in etiology, including from diabetic gastroparesis vs cocaine-induced gastritis vs GYN etiologies (fibroid vs endometriosis). Discussed plan to treat with reglan, dietary consult, and PPI, as well as continued outpatient f/u.  Objective:  Vital signs in last 24 hours: Vitals:   09/02/17 2200 09/02/17 2300 09/03/17 0028 09/03/17 0510  BP: (!) 172/110 110/81 (!) 120/91 100/71  Pulse: (!) 101 97 94 88  Resp: 15 15 18 20   Temp:   98.1 F (36.7 C) 98.8 F (37.1 C)  TempSrc:   Oral Oral  SpO2: 100% 100% 100% 98%  Weight:   197 lb 9.6 oz (89.6 kg)   Height:   5\' 7"  (1.702 m)    Physical Exam Constitutional: Obese, NAD, lying in bed Cardiovascular: NR & RR, no murmurs, rubs, or gallops.  Pulmonary/Chest: Breathing comfortably on RA, no increased WOB Abdominal: Obese, soft, TTP over LLQ, normal bowel sounds Neurological: A&Ox3, CN II - XII grossly intact Skin: No rashes or erythema   Assessment/Plan:  Shirley [name] is a 42 yo female with PMH for diabetes, gastroparesis, CAD s/p PCI, CVA, HTN, VTE on eliquis, cocaine use, and fibromyalgia presenting with 4 days of acute on chronic nausea, vomiting, and abdominal pain.   Nausea, Vomiting, LLQ Abdominal Pain Chronic, recurrent, dating back to at least 2011 in our system. She has had extensive inpatient work-up without clear etiology. Differential includes gastroparesis 2/2 diabetes vs cocaine induced gastritis (UDS positive for  cocaine), vs endometriosis vs vasculitis vs functional. Gastric emptying study in 2011 was normal, however EGD in 2015 showed retained food contents in the gastric body, concerning for gastroparesis. Biopsy at that time showed chronic focally active gastritis. CT-A of abdomen in 11/2016 was negative for evidence of vasculitis. Currently treating symptomatically for gastroparesis and gastritis - will need continued outpatient work-up. She was previously prescribed erythromycin by her PCP per last note from 01/2017 after failing reglan. She reports feeling hungry this morning - plan will be to discharge when able to tolerate PO intake. - Continue metoclopramide 5 mg TID w/ meals - Continue Protonix 40 mg qday, Sucralfate 1 g TID AC & QHS - Continue Zofran PRN - Continue tylenol PRN for pain, avoiding narcotics as this can worsen gastroparesis - Dietary consult for patient education on gastroparesis diet; appreciate their assistance - CLD, Advance diet as tolerated - Consider outpatient gyn follow up for further work up - Outpatient gastric emptying study - F/u with PCP and GYN  Chronic Intermittent Hematuria Again dating back to 2011, with multiple UAs showing small, moderate, and large hematuria. CT abdomen in 12/2016 negative for renal mass or nephrolithiasis. Possibly related to menses but in the setting of her abdominal symptoms will need outpatient follow up with UA while not menstruating. - Outpatient UA when not menstruating - Consider outpatient cystoscopy  Tachycardia, improved Likely secondary to beta blocker withdrawal due to inability to tolerate PO meds vs. cocaine use. Vital signs otherwise stable. - Continue home metoprolol  Hx of HTN BP 100/71 this AM on  home meds. - Continue home HCTZ & Metoprolol   Type II DM Takes 70/50 Novolin 50 units BID at home. A1c last month was 10.2. BG 132-176. - Hold home regimen  - Continue SSI-sensitive TID AC while PO intake is  decreased  Dispo: Anticipated discharge in approximately 1-2 day(s).   Colbert Ewing, MD 09/03/2017, 7:03 AM Pager: [phone]

## 2017-09-03 NOTE — Progress Notes (Signed)
Patient tolerated lunch without difficulty. No report of nausea or emesis.

## 2017-09-03 NOTE — Plan of Care (Signed)
  Problem: Education: Goal: Knowledge of General Education information will improve Outcome: Progressing   Problem: Health Behavior/Discharge Planning: Goal: Ability to manage health-related needs will improve Outcome: Progressing   Problem: Nutrition: Goal: Adequate nutrition will be maintained Outcome: Progressing   Problem: Pain Managment: Goal: General experience of comfort will improve Outcome: Progressing   Problem: Safety: Goal: Ability to remain free from injury will improve Outcome: Progressing

## 2017-09-03 NOTE — Progress Notes (Addendum)
Paged again about Phenergan and another pain medication since patient is in pain despite Tylenol given. Per IM resident [place] Cerise will be ordered, no stronger pain medication will be order at this time due to gastroparesis.  Will continue to monitor.  Camron Monday, RN

## 2017-09-03 NOTE — Progress Notes (Signed)
Patient requested graham crackers- tolerated well. Will reassess diet advancement in the morning.

## 2017-09-03 NOTE — Discharge Instructions (Addendum)
Gastroparesis °Gastroparesis, also called delayed gastric emptying, is a condition in which food takes longer than normal to empty from the stomach. The condition is usually long-lasting (chronic). °What are the causes? °This condition may be caused by: °· An endocrine disorder, such as hypothyroidism or diabetes. Diabetes is the most common cause of this condition. °· A nervous system disease, such as Parkinson disease or multiple sclerosis. °· Cancer, infection, or surgery of the stomach or vagus nerve. °· A connective tissue disorder, such as scleroderma. °· Certain medicines. ° °In most cases, the cause is not known. °What increases the risk? °This condition is more likely to develop in: °· People with certain disorders, including endocrine disorders, eating disorders, amyloidosis, and scleroderma. °· People with certain diseases, including Parkinson disease or multiple sclerosis. °· People with cancer or infection of the stomach or vagus nerve. °· People who have had surgery on the stomach or vagus nerve. °· People who take certain medicines. °· Women. ° °What are the signs or symptoms? °Symptoms of this condition include: °· An early feeling of fullness when eating. °· Nausea. °· Weight loss. °· Vomiting. °· Heartburn. °· Abdominal bloating. °· Inconsistent blood glucose levels. °· Lack of appetite. °· Acid from the stomach coming up into the esophagus (gastroesophageal reflux). °· Spasms of the stomach. ° °Symptoms may come and go. °How is this diagnosed? °This condition is diagnosed with tests, such as: °· Tests that check how long it takes food to move through the stomach and intestines. These tests include: °? Upper gastrointestinal (GI) series. In this test, X-rays of the intestines are taken after you drink a liquid. The liquid makes the intestines show up better on the X-rays. °? Gastric emptying scintigraphy. In this test, scans are taken after you eat food that contains a small amount of radioactive  material. °? Wireless capsule GI monitoring system. This test involves swallowing a capsule that records information about movement through the stomach. °· Gastric manometry. This test measures electrical and muscular activity in the stomach. It is done with a thin tube that is passed down the throat and into the stomach. °· Endoscopy. This test checks for abnormalities in the lining of the stomach. It is done with a long, thin tube that is passed down the throat and into the stomach. °· An ultrasound. This test can help rule out gallbladder disease or pancreatitis as a cause of your symptoms. It uses sound waves to take pictures of the inside of your body. ° °How is this treated? °There is no cure for gastroparesis. This condition may be managed with: °· Treatment of the underlying condition causing the gastroparesis. °· Lifestyle changes, including exercise and dietary changes. Dietary changes can include: °? Changes in what and when you eat. °? Eating smaller meals more often. °? Eating low-fat foods. °? Eating low-fiber forms of high-fiber foods, such as cooked vegetables instead of raw vegetables. °? Having liquid foods in place of solid foods. Liquid foods are easier to digest. °· Medicines. These may be given to control nausea and vomiting and to stimulate stomach muscles. °· Getting food through a feeding tube. This may be done in severe cases. °· A gastric neurostimulator. This is a device that is inserted into the body with surgery. It helps improve stomach emptying and control nausea and vomiting. ° °Follow these instructions at home: °· Follow your health care provider's instructions about exercise and diet. °· Take medicines only as directed by your health care provider. °Contact a   health care provider if:  Your symptoms do not improve with treatment.  You have new symptoms. Get help right away if:  You have severe abdominal pain that does not improve with treatment.  You have nausea that does  not go away.  You cannot keep fluids down. This information is not intended to replace advice given to you by your health care provider. Make sure you discuss any questions you have with your health care provider. Document Released: 04/10/2005 Document Revised: 09/16/2015 Document Reviewed: 04/06/2014 Elsevier Interactive Patient Education  2018 [place] on my medicine - ELIQUIS (apixaban)  This medication education was reviewed with me or my healthcare representative as part of my discharge preparation.  The pharmacist that spoke with me during my hospital stay was:  Saundra Shelling, [facility]  Why was Eliquis prescribed for you? 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  What do You need to know about Eliquis ? The dose is ONE 5 mg tablet taken TWICE daily.  Eliquis may be taken with or without food.   Try to take the dose about the same time in the morning and in the evening. If you have difficulty swallowing the tablet whole please discuss with your pharmacist how to take the medication safely.  Take Eliquis exactly as prescribed and DO NOT stop taking Eliquis without talking to the doctor who prescribed the medication.  Stopping may increase your risk of developing a new blood clot.  Refill your prescription before you run out.  After discharge, you should have regular check-up appointments with your healthcare provider that is prescribing your Eliquis.    What do you do if you miss a dose? If a dose of ELIQUIS is not taken at the scheduled time, take it as soon as possible on the same day and twice-daily administration should be resumed. The dose should not be doubled to make up for a missed dose.  Important Safety Information A possible side effect of Eliquis is bleeding. You should call your  healthcare provider right away if you experience any of the following: ? Bleeding from an injury or your nose that does not stop. ? Unusual colored urine (red or dark brown) or unusual colored stools (red or black). ? Unusual bruising for unknown reasons. ? A serious fall or if you hit your head (even if there is no bleeding).  Some medicines may interact with Eliquis and might increase your risk of bleeding or clotting while on Eliquis. To help avoid this, consult your healthcare provider or pharmacist prior to using any new prescription or non-prescription medications, including herbals, vitamins, non-steroidal anti-inflammatory drugs (NSAIDs) and supplements.  This website has more information on Eliquis (apixaban): http://www.eliquis.com/eliquis/home

## 2017-09-03 NOTE — Progress Notes (Signed)
RT NOTE:  Pt does not wear CPAP. RT available if patient changes mind

## 2017-09-03 NOTE — Progress Notes (Signed)
Patient asking to have phenergan reordered for nausea, MD paged.

## 2017-09-03 NOTE — Discharge Summary (Signed)
Name: Shirley [name] MRN: [id number] DOB: 29-Sep-1975 42 y.o. PCP: Tawny Asal  Date of Admission: 09/01/2017  7:17 PM Date of Discharge: 09/04/2017 Attending Physician: Oval Linsey, MD  Discharge Diagnosis: 1. Acute on chronic abdominal pain, N/V 2. Diabetes 3. Uterine fibroid 4. Chronic intermittent hematuria  Principal Problem:   Nausea and vomiting Active Problems:   Poorly controlled type 2 diabetes mellitus with peripheral neuropathy (HCC)   Essential hypertension   Abdominal pain, left lower quadrant   Gastroparesis   Discharge Medications: Allergies as of 09/04/2017      Reactions   Lisinopril Nausea Only, Swelling   Morphine And Related Itching, Swelling      Medication List    STOP taking these medications   insulin NPH-regular Human (70-30) 100 UNIT/ML injection Commonly known as:  NOVOLIN 70/30     TAKE these medications   albuterol 108 (90 Base) MCG/ACT inhaler Commonly known as:  PROVENTIL HFA;VENTOLIN HFA Inhale 2 puffs into the lungs every 4 (four) hours as needed for wheezing or shortness of breath.   apixaban 5 MG Tabs tablet Commonly known as:  ELIQUIS Take 1 tablet (5 mg total) by mouth 2 (two) times daily.   erythromycin ethylsuccinate 200 MG/5ML suspension Commonly known as:  EES Take 5 mLs (200 mg total) by mouth 3 (three) times daily. Take 15 minutes before a meal   feeding supplement (GLUCERNA SHAKE) Liqd Take 237 mLs by mouth 3 (three) times daily between meals.   freestyle lancets Use as instructed   glucose blood test strip Commonly known as:  FREESTYLE TEST STRIPS Use as instructed   glucose monitoring kit monitoring kit 1 each by Does not apply route 4 (four) times daily - after meals and at bedtime. 1 month Diabetic Testing Supplies for QAC-QHS accuchecks.   hydrochlorothiazide 25 MG tablet Commonly known as:  HYDRODIURIL Take 1 tablet (25 mg total) by mouth daily. Take on tablet in the morning.     insulin NPH Human 100 UNIT/ML injection Commonly known as:  HUMULIN N Inject 0.12 mLs (12 Units total) into the skin 2 (two) times daily before a meal.   INSULIN SYRINGE .5CC/28G 28G X 1/2" 0.5 ML Misc Commonly known as:  INS SYRINGE/NEEDLE .5CC/28G Please provide 1 month supply   metoprolol tartrate 100 MG tablet Commonly known as:  LOPRESSOR Take 1 tablet (100 mg total) by mouth 2 (two) times daily.   pantoprazole 40 MG tablet Commonly known as:  PROTONIX Take 1 tablet (40 mg total) by mouth daily.   promethazine 25 MG tablet Commonly known as:  PHENERGAN Take 1 tablet (25 mg total) by mouth every 6 (six) hours as needed for nausea or vomiting.   traMADol 50 MG tablet Commonly known as:  ULTRAM Take 1 tablet (50 mg total) by mouth every 8 (eight) hours as needed for up to 5 days for moderate pain or severe pain.       Disposition and follow-up:   [name] was discharged from [facility] in Stable condition.  At the hospital follow up visit please address:  1.  Acute on chronic N/V, abdominal pain - Concern for diabetic gastroparesis vs fibroids/possible endometriosis. Please reassess symptoms. Ensure compliance with erythromycin and protonix and small frequent meals. Consider outpatient gastric emptying study  2.  Chronic intermittent hematuria - Recommend outpatient UA while not menstruating and consider cystoscopy.  3.  Uterine fibroid - S/p embolization on previous admission. Sees GYN and considering  hysterectomy. Please ensure f/u with GYN  4.  ?Possible endometriosis - This was considered as a possible etiology for her chronic abdominal pain - ensure f/u with GYN.  5.  Diabetes - Insulin was changed from 70/30 to Insulin NPH due to patient's decreased PO intake. Dose also changed from 50 BID to 12 BID due to suspicion for not taking insulin as an outpatient. She did not require much insulin while inpatient. New glucometer sent to pharmacy.  Continue titrating as appropriate.  6.  Labs / imaging needed at time of follow-up: None  7.  Pending labs/ test needing follow-up: None  Follow-up Appointments: Follow-up Information    Clent Demark, PA-C. Schedule an appointment as soon as possible for a visit in 1 week(s).   Specialty:  Physician Assistant Contact information: [place] [id number] [phone]        Lavonia Drafts, MD. Go on 09/26/2017.   Specialty:  Obstetrics and Gynecology Why:  '@10'$ :55am Contact information: [place] [address] [place] Hospital Course by problem list: Principal Problem:   Nausea and vomiting Active Problems:   Poorly controlled type 2 diabetes mellitus with peripheral neuropathy ([place])   Essential hypertension   Abdominal pain, left lower quadrant   Gastroparesis   1. N/V, LLQ abdominal pain Patient presents with acute on chronic N/V and abdominal pain. Her symptoms are chronic, recurrent, and dating back to at least 2011 in our system. She has had extensive inpatient work-up without clear etiology in the past. Highest concern is gastroparesis secondary to diabetes. She had a normal gastric emptying study in 2011, but EGD in 2015 showed retained food contents in gastric body, concerning for gastroparesis. She was seen by her PCP last year and prescribed erythromycin because it was thought that she had failed reglan since she continued to have symptoms. Recommend outpatient gastric emptying study. Another consideration is cocaine-induced gastritis (UDS positive for cocaine on this admission), although she has had a flare up of symptoms with a negative UDS in the past. Alternatively, GYN etiologies could be contributing, like endometriosis or pressure from known uterine fibroid. There was discussion from the previous GYN note for possible hysterectomy given continued symptoms s/p embolization during her previous  admission. She was encouraged to follow up with her outpatient GYN to continue this management and possible work-up for endometriosis. Finally, a functional etiology was also considered - for this, would recommend regular follow up with PCP.  While inpatient, she was managed symptomatically with anti-emetics, reglan, and PPI for possible gastritis. She was able to tolerate PO intake prior to discharge. She continued to endorse abdominal pain, however it was discussed that the work-up can and should be continued as an outpatient with PCP and GYN. She was discharged with a 5 day course of tramadol - it appears that she gets chronic narcotic prescriptions from an outpatient provider. Also discharged with erythromycin. She was also educated on appropriate dietary choices, as well as small frequent meals to help manage gastroparesis.  Chronic intermittent hematuria Multiple UAs have shown hematuria since 2011 - possibly related to menses, but would recommend outpatient UA while not menstruating to confirm. Also could consider cystoscopy for further evaluation.  Tachycardia Likely secondary to beta blocker withdrawal in the setting of inability to tolerate PO meds vs cocaine use. Vital signs were otherwise stable and home metoprolol was restarted with resolution of tachycardia.  Uncontrolled Diabetes Patient  was previously on Novolin 70/30 50 units BID, however patient stated she was not self-administering this prior to admission due to decreased PO intake. She was managed with a much lower dose inpatient and I suspect that her dose prior to admission was too high in the setting of nonadherence. Thus, her insulin was switched to Humulin N 12 units BID to limit the effect of decreased PO intake. She was also educated to take half of her dose if not eating. She was provided with a new glucometer on discharge as she stated she did not have one and thus was not able to check her BG at home. Her insulin dose will  need to be adjusted as an outpatient - please continue this management.  Discharge Vitals:   BP 98/66 (BP Location: Right Arm)   Pulse 78   Temp 98.1 F (36.7 C) (Oral)   Resp 18   Ht 5' 7" (1.702 m)   Wt 196 lb 9 oz (89.2 kg)   SpO2 100%   BMI 30.79 kg/m   Pertinent Labs, Studies, and Procedures:  CBC Latest Ref Rng & Units 09/02/2017 09/01/2017 08/12/2017  WBC 4.0 - 10.5 K/uL 17.1(H) 15.6(H) 9.9  Hemoglobin 12.0 - 15.0 g/dL 12.4 14.3 11.9(L)  Hematocrit 36.0 - 46.0 % 36.1 41.0 35.5(L)  Platelets 150 - 400 K/uL 399 462(H) 398   CMP Latest Ref Rng & Units 09/02/2017 09/01/2017 08/13/2017  Glucose 65 - 99 mg/dL 160(H) 181(H) 304(H)  BUN 6 - 20 mg/dL 17 20 36(H)  Creatinine 0.44 - 1.00 mg/dL 1.10(H) 1.15(H) 1.62(H)  Sodium 135 - 145 mmol/L 139 136 132(L)  Potassium 3.5 - 5.1 mmol/L 3.9 5.0 3.5  Chloride 101 - 111 mmol/L 103 101 100(L)  CO2 22 - 32 mmol/L _0 Calcium 8.9 - 10.3 mg/dL 9.0 9.4 8.1(L)  Total Protein 6.5 - 8.1 g/dL - 8.5(H) -  Total Bilirubin 0.3 - 1.2 mg/dL - 1.4(H) -  Alkaline Phos 38 - 126 U/L - 74 -  AST 15 - 41 U/L - 46(H) -  ALT 14 - 54 U/L - 18 -   UDS positive for cocaine Mg 1.8 UA large Hb, 20 ketones, 300 protein, 50 RBC, rare bacteria, +squam epithelial cells Lipase 26  Discharge Instructions: Discharge Instructions    Diet - low sodium heart healthy   Complete by:  As directed    Discharge instructions   Complete by:  As directed    Ms. Andersson,  While you were in the hospital, we treated you for stomach pain and nausea. We are not exactly sure what is causing the stomach pain, but like we discussed, we unfortunately cannot treat your pain overnight and it may be a result of several issues. First, it could be related to slow bowel movement from the diabetes. Second, it could be related to irritation of the stomach lining from drugs like alcohol/cocaine. Third, it could be related to the fibroids in your uterus. We talked about the treatment plan  as the following: - Take erythromycin 272m three times a day 15 minutes before a meal. - Take protonix 444monce a day. - Make sure to follow up with your gynecology doctor to discuss whether or not to get the hysterectomy for the fibroids. - Please schedule a follow up appointment with your primary doctor so that he can continue the work-up for the pain as well. - We have given you a short course of pain medicine, you can take tramadol 5018m  every 8 hours as needed. By law, we are not allowed to give longer than 5 days of narcotic pain medicine when you leave the hospital. Please continue to follow up with your outpatient doctors for long-term pain medicines. - Some things you can change in your diet to help with your pain include the following: --- Eating smaller meals more often. --- Eating low-fat foods. --- Eating low-fiber forms of high-fiber foods, such as cooked vegetables instead of raw vegetables. --- Having liquid foods in place of solid foods. Liquid foods are easier to digest.  It is important that we get your diabetes under control. - Please take 12 units of Humulin N twice a day before a meal. If you do not eat, take HALF of this dose (so that would be 6 units twice a day). - Please pick up the glucose meter from your pharmacy and check your blood sugar 3 times a day before meals and at bedtime. Follow up with your primary doctor to continue figuring out exactly how much insulin you need to be on.   Increase activity slowly   Complete by:  As directed      Signed: Colbert Ewing, MD 09/04/2017, 3:18 PM   Pager: Mamie Nick [phone]

## 2017-09-03 NOTE — Progress Notes (Signed)
Initial Nutrition Assessment  DOCUMENTATION CODES:   Obesity unspecified  INTERVENTION:   -Glucerna Shake po TID, each supplement provides 220 kcal and 10 grams of protein -Provided "Gastroparesis Nutrition Therapy" handout from [facility] Nutrition Care Manual  NUTRITION DIAGNOSIS:   Inadequate oral intake related to altered GI function as evidenced by meal completion < 50%.  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 She presents with a flare of her chronic N/V/ABD pain. This started about 4 days ago. She is on her menses which she states usually makes the sxs worse but the sxs can also worsen when not on her menses. She states at max, she may have 7 days without sxs. Eating a fatty meal sometimes does and sometimes does not cause sxs. She denied cocaine use to the admitting team and denied THC or synthetic THC to day team.  Pt admitted with nausea, vomiting, and LLQ abdominal pain.   Pt in significant pain at time of visit. Pt minimally interacted with RD and did not answer interview questions. Pt is not appropriate for education at this time. RD provided "Gastroparesis Nutrition Therapy" handout from [facility] Nutrition Care Manual with D contact information for pt to review when feeling better.   Reviewed wt hx; pt has experienced a 6.2% wt loss over the past 6 months, which is not significant for time frame. Suspect some wt loss may be related to acute illness as well as uncontrolled DM.   Per MD notes, pt with appetite earlier this morning and was advanced to carb modified diet from clear liquids. Observed lunch tray, which was untouched.   Last A1c: 10.2 (08/08/17). PTA DM  medication 50 units 70/30 insulin NPH-regular.   Labs reviewed: CBGS: 116-176 (inpatient orders for glycemic control are 0-9 units insulin aspartTID with meals).   NUTRITION - FOCUSED PHYSICAL EXAM:    Most Recent Value  Orbital Region  No depletion  Upper Arm Region  No depletion  Thoracic and Lumbar Region  No depletion  Buccal Region  No depletion  Temple Region  No depletion  Clavicle Bone Region  No depletion  Clavicle and Acromion Bone Region  No depletion  Scapular Bone Region  No depletion  Dorsal Hand  No depletion  Patellar Region  No depletion  Anterior Thigh Region  No depletion  Posterior Calf Region  No depletion  Edema (RD Assessment)  None  Hair  Reviewed  Eyes  Reviewed  Mouth  Reviewed  Skin  Reviewed  Nails  Reviewed       Diet Order:   Diet Order           Diet Carb Modified Fluid consistency: Thin; Room service appropriate? Yes  Diet effective now          EDUCATION NEEDS:   Not appropriate for education at this time  Skin:  Skin Assessment: Reviewed RN Assessment  Last BM:  08/30/17  Height:   Ht Readings from Last 1 Encounters:  09/03/17 5\' 7"  (1.702 m)    Weight:   Wt Readings from Last 1 Encounters:  09/03/17 197 lb 9.6 oz (89.6 kg)    Ideal Body Weight:  61.4 kg  BMI:  Body mass index is 30.95 kg/m.  Estimated Nutritional Needs:   Kcal:  1650-1850  Protein:  80-95 grams  Fluid:  1.6-1.8 L    Dorman Calderwood A. Jimmye Norman, RD, LDN, CDE Pager: [phone] After hours Pager: [phone]

## 2017-09-04 DIAGNOSIS — Z79891 Long term (current) use of opiate analgesic: Secondary | ICD-10-CM

## 2017-09-04 LAB — GLUCOSE, CAPILLARY
Glucose-Capillary: 351 mg/dL — ABNORMAL HIGH (ref 65–99)
Glucose-Capillary: 154 mg/dL — ABNORMAL HIGH (ref 65–99)
Glucose-Capillary: 311 mg/dL — ABNORMAL HIGH (ref 65–99)

## 2017-09-04 MED ORDER — FREESTYLE SYSTEM KIT: 1.0000 | PACK | Freq: Three times a day (TID) | 0 refills | Status: DC

## 2017-09-04 MED ORDER — INSULIN NPH (HUMAN) (ISOPHANE) 100 UNIT/ML ~~LOC~~ SUSP: 12.0000 [IU] | Freq: Two times a day (BID) | SUBCUTANEOUS | 0 refills | Status: DC

## 2017-09-04 MED ORDER — ERYTHROMYCIN ETHYLSUCCINATE 200 MG/5ML PO SUSR: 200.0000 mg | Freq: Three times a day (TID) | ORAL | 0 refills | Status: DC

## 2017-09-04 MED ORDER — "INSULIN SYRINGE 28G X 1/2"" 0.5 ML MISC": 0 refills | Status: AC

## 2017-09-04 MED ORDER — GLUCERNA SHAKE PO LIQD: 237.0000 mL | Freq: Three times a day (TID) | ORAL | 0 refills | Status: DC

## 2017-09-04 MED ORDER — TRAMADOL HCL 50 MG PO TABS: 50.0000 mg | ORAL_TABLET | Freq: Three times a day (TID) | ORAL | 0 refills | Status: AC | PRN

## 2017-09-04 MED ORDER — GLUCOSE BLOOD VI STRP: ORAL_STRIP | 0 refills | Status: DC

## 2017-09-04 MED ORDER — FREESTYLE LANCETS MISC: 0 refills | Status: DC

## 2017-09-04 MED ORDER — PROMETHAZINE HCL 25 MG PO TABS
12.5000 mg | ORAL_TABLET | Freq: Once | ORAL | Status: AC
  Administered 2017-09-04: 12.5 mg via ORAL
  Filled 2017-09-04: qty 1

## 2017-09-04 MED ORDER — PANTOPRAZOLE SODIUM 40 MG PO TBEC: 40.0000 mg | DELAYED_RELEASE_TABLET | Freq: Every day | ORAL | 0 refills | Status: DC

## 2017-09-04 MED ORDER — PROMETHAZINE HCL 25 MG PO TABS: 25.0000 mg | ORAL_TABLET | Freq: Four times a day (QID) | ORAL | 0 refills | Status: DC | PRN

## 2017-09-04 MED FILL — ?HUMULIN N 100 UNITS/ML VIA: 100 | 28 days supply | Qty: 10 | Fill #0

## 2017-09-04 MED FILL — PROMETHAZINE 25 MG TABLET: 25 | 3 days supply | Qty: 15 | Fill #0

## 2017-09-04 MED FILL — TRUEplus LANCETS 28G MISC: 25 days supply | Qty: 100 | Fill #0

## 2017-09-04 MED FILL — traMADol HCL 50 MG TABS: 50 | 5 days supply | Qty: 15 | Fill #0

## 2017-09-04 MED FILL — TRUE METRIX TEST STRIP: 25 days supply | Qty: 100 | Fill #0

## 2017-09-04 MED FILL — PANTOPRAZOLE SOD DR 40 MG T: 40 | 30 days supply | Qty: 30 | Fill #0

## 2017-09-04 NOTE — Progress Notes (Signed)
Will provide patient with [facility] letter prior to [place]

## 2017-09-04 NOTE — Care Management Note (Signed)
Case Management Note  Patient Details  Name: Shirley [name] MRN: [id number] Date of Birth: 16-Feb-1976  Subjective/Objective:                 Patient states she goes to [facility] and gets meds through [facility] pharmacy. No CM needs identified.   Action/Plan:   Expected Discharge Date:  09/03/17               Expected Discharge Plan:  Home/Self Care  In-House Referral:     Discharge planning Services  CM Consult  Post Acute Care Choice:    Choice offered to:     DME Arranged:    DME Agency:     HH Arranged:    HH Agency:     Status of Service:  Completed, signed off  If discussed at [employer] of Stay Meetings, dates discussed:    Additional Comments:  Carles Collet, RN 09/04/2017, 2:02 PM

## 2017-09-04 NOTE — Progress Notes (Signed)
Inpatient Diabetes Program Recommendations  AACE/ADA: New Consensus Statement on Inpatient Glycemic Control (2015)  Target Ranges:  Prepandial:   less than 140 mg/dL      Peak postprandial:   less than 180 mg/dL (1-2 hours)      Critically ill patients:  140 - 180 mg/dL   Lab Results  Component Value Date   GLUCAP 351 (H) 09/04/2017   HGBA1C 10.2 (H) 08/08/2017    Review of Glycemic ControlResults for Shirley, [name] (MRN [id number]) as of 09/04/2017 11:18  Ref. Range 09/03/2017 08:37 09/03/2017 11:32 09/03/2017 15:57 09/03/2017 21:32 09/04/2017 07:32  Glucose-Capillary Latest Ref Range: 65 - 99 mg/dL 145 (H) 146 (H) 127 (H) 204 (H) 351 (H)    Diabetes history: Type 2 DM per H&P Outpatient Diabetes medications:  Novolin 70/30 50 units bid ( patient states she was not taking/ she did not get Rx. Filled after last admission) Current orders for Inpatient glycemic control:  Novolog sensitive tid with meals  Inpatient Diabetes Program Recommendations:    Note elevated A1c and gastroparesis.  Talked with patient regarding home DM management.  She states that she was not taking insulin prior to admit and that she has been afraid to eat due to nausea/vomiting.  Gave her paperwork that was lying on bedside table regarding Gastroparesis and DM diabetes.  She states that she must have been "asleep" when the dietician came.  She does get her medications at the [facility] and see's MD at [facility]. Explained that 70/30 insulin should be taken with food and that her dose likely needs to be adjusted when not eating.  She is not checking her blood sugars at this time.  Discussed [employer] meter with patient that is 14$.  It appears that resources are a barrier for this patient as she does not have Rx. Coverage. However she can get prescriptions filled at the [facility].  Called and discussed with resident, Dr. Ronalee Red.  Discussed possibility of reducing insulin since patient was not  taking consistently prior to admit.  Also may consider d/c of 70/30 and start NPH. May consider NPH 12 units bid initially.  Likely will need adjustment.  Also patient would benefit from instructions on how to adjust insulin doses when she is unable to eat or nauseas.    Overall patient was very interactive.  She seemed frustrated with gastroparesis and nausea/vomitting.  She was appreciative of information.  She needs close follow-up with PCP.   Thanks,  Adah Perl, RN, BC-ADM Inpatient Diabetes Coordinator Pager [phone] (8a-5p)

## 2017-09-04 NOTE — Progress Notes (Signed)
   Subjective:  Patient reports continued LLQ abdominal pain. She was able to eat breakfast this morning. Denies nausea or emesis this morning. We had another discussion today about the etiologies of her abdominal pain and plans for each of them, including the importance of outpatient follow up to continue working up the reason for her pain. Dietitian came by to see her yesterday, however she could not remember their conversation or what was discussed. Discussed with patient plan to discharge home today with symptomatic treatments and continued outpatient follow-up.  Objective:  Vital signs in last 24 hours: Vitals:   09/03/17 2032 09/04/17 0116 09/04/17 0437 09/04/17 0525  BP: (!) 139/94 117/87 102/63   Pulse: (!) 110 84 93   Resp: 18     Temp: 98.7 F (37.1 C) 98.5 F (36.9 C)  97.9 F (36.6 C)  TempSrc: Oral Oral  Oral  SpO2: 98% 96% 98%   Weight:    196 lb 9 oz (89.2 kg)  Height:       Physical Exam Constitutional: Obese, NAD, lying in bed Cardiovascular: NR & RR, no murmurs, rubs, or gallops Pulmonary/Chest: Breathing comfortably on RA, no increased WOB Abdominal: Obese, soft, TTP over LLQ, normal bowel sounds Neurological: A&Ox3, CN II - XII grossly intact Skin: No rashes or erythema   Assessment/Plan:  Ms. Fatzinger is a 42 yo female with PMH for diabetes, gastroparesis, CAD s/p PCI, CVA, HTN, VTE on eliquis, cocaine use, and fibromyalgia presenting with 4 days of acute on chronic nausea, vomiting, and abdominal pain, likely multifactorial in etiology, including contributors of gastroparesis 2/2 diabetes vs cocaine induced gastritis vs GYN etiology (fibroid or endometriosis). Patient has had extensive inpatient work-up in the past, and we discussed planning for treatment for gastroparesis and to continue follow up with GYN to address fibroids and/or possible endometriosis.  Nausea, Vomiting, LLQ Abdominal Pain Patient tolerated PO intake this morning. Continues to endorse  abdominal pain, however we discussed today that we are hesitant to give narcotics given that it worsens gastroparesis. We also discussed the importance of continuing outpatient follow-up in order to get to the etiologies of her pain. Currently treating symptomatically for gastroparesis and gastritis and we have stressed follow-up with GYN.  - Continue metoclopramide 5 mg TID w/ meals - Continue Protonix 40 mg qday, Sucralfate 1 g TID AC & QHS - Continue Zofran PRN - Continue tylenol PRN for pain, avoiding narcotics as this can worsen gastroparesis - Dietary consult for patient education on gastroparesis diet; appreciate their assistance - Advance diet as tolerated - Outpatient GYN follow-up - Outpatient gastric emptying study - Will discharge with erythromycin and short course of tramadol  Chronic Intermittent Hematuria Possibly related to menses but in the setting of her abdominal symptoms will need outpatient follow up with UA while not menstruating. - Outpatient UA when not menstruating - Consider outpatient cystoscopy  Tachycardia, improved Likely secondary to beta blocker withdrawal due to inability to tolerate PO meds vs. cocaine use. Vital signs otherwise stable. - Continue home metoprolol  Hx of HTN BP 102/63 this AM on home meds. - Continue home HCTZ & Metoprolol   Type II DM Takes 70/50 Novolin 50 units BID at home. A1c last month was 10.2. BG 132-176. - Hold home regimen  - Continue SSI-sensitive TID AC while PO intake is decreased  Dispo: Anticipated discharge in approximately 0-1 day(s).   Colbert Ewing, MD 09/04/2017, 6:22 AM Pager: [phone]

## 2017-09-04 NOTE — Progress Notes (Signed)
Reviewed discharge instructions/medications with patient. Answered her questions. Patient is feeling better. Patient is waiting on ride.

## 2017-09-04 NOTE — Progress Notes (Signed)
Patient's ride is no longer available to pick her up. Called ED social worker. She is going to send a voucher for patient to take a cab home.

## 2017-09-19 ENCOUNTER — Inpatient Hospital Stay (HOSPITAL_COMMUNITY)
Admission: EM | Admit: 2017-09-19 | Discharge: 2017-09-23 | DRG: 073 | Disposition: A | Discharge status: Home / Self Care | Payer: Self-pay | Attending: Family Medicine | Admitting: Family Medicine

## 2017-09-19 ENCOUNTER — Other Ambulatory Visit: Payer: Self-pay

## 2017-09-19 ENCOUNTER — Encounter (HOSPITAL_COMMUNITY): Payer: Self-pay

## 2017-09-19 DIAGNOSIS — Z833 Family history of diabetes mellitus: Secondary | ICD-10-CM

## 2017-09-19 DIAGNOSIS — E1165 Type 2 diabetes mellitus with hyperglycemia: Secondary | ICD-10-CM | POA: Diagnosis present

## 2017-09-19 DIAGNOSIS — B181 Chronic viral hepatitis B without delta-agent: Secondary | ICD-10-CM | POA: Diagnosis present

## 2017-09-19 DIAGNOSIS — F1910 Other psychoactive substance abuse, uncomplicated: Secondary | ICD-10-CM | POA: Diagnosis present

## 2017-09-19 DIAGNOSIS — Z888 Allergy status to other drugs, medicaments and biological substances status: Secondary | ICD-10-CM

## 2017-09-19 DIAGNOSIS — I2510 Atherosclerotic heart disease of native coronary artery without angina pectoris: Secondary | ICD-10-CM | POA: Diagnosis present

## 2017-09-19 DIAGNOSIS — R112 Nausea with vomiting, unspecified: Secondary | ICD-10-CM | POA: Diagnosis present

## 2017-09-19 DIAGNOSIS — Z8673 Personal history of transient ischemic attack (TIA), and cerebral infarction without residual deficits: Secondary | ICD-10-CM

## 2017-09-19 DIAGNOSIS — F329 Major depressive disorder, single episode, unspecified: Secondary | ICD-10-CM | POA: Diagnosis present

## 2017-09-19 DIAGNOSIS — E1143 Type 2 diabetes mellitus with diabetic autonomic (poly)neuropathy: Principal | ICD-10-CM | POA: Diagnosis present

## 2017-09-19 DIAGNOSIS — N179 Acute kidney failure, unspecified: Secondary | ICD-10-CM | POA: Diagnosis present

## 2017-09-19 DIAGNOSIS — I160 Hypertensive urgency: Secondary | ICD-10-CM | POA: Diagnosis present

## 2017-09-19 DIAGNOSIS — E860 Dehydration: Secondary | ICD-10-CM | POA: Diagnosis not present

## 2017-09-19 DIAGNOSIS — I10 Essential (primary) hypertension: Secondary | ICD-10-CM | POA: Diagnosis present

## 2017-09-19 DIAGNOSIS — I252 Old myocardial infarction: Secondary | ICD-10-CM

## 2017-09-19 DIAGNOSIS — R1084 Generalized abdominal pain: Secondary | ICD-10-CM

## 2017-09-19 DIAGNOSIS — Z9114 Patient's other noncompliance with medication regimen: Secondary | ICD-10-CM

## 2017-09-19 DIAGNOSIS — K2970 Gastritis, unspecified, without bleeding: Secondary | ICD-10-CM | POA: Diagnosis present

## 2017-09-19 DIAGNOSIS — I2699 Other pulmonary embolism without acute cor pulmonale: Secondary | ICD-10-CM | POA: Diagnosis present

## 2017-09-19 DIAGNOSIS — K219 Gastro-esophageal reflux disease without esophagitis: Secondary | ICD-10-CM | POA: Diagnosis present

## 2017-09-19 DIAGNOSIS — Z87891 Personal history of nicotine dependence: Secondary | ICD-10-CM

## 2017-09-19 DIAGNOSIS — E1142 Type 2 diabetes mellitus with diabetic polyneuropathy: Secondary | ICD-10-CM | POA: Diagnosis present

## 2017-09-19 DIAGNOSIS — Z7901 Long term (current) use of anticoagulants: Secondary | ICD-10-CM

## 2017-09-19 DIAGNOSIS — Z86718 Personal history of other venous thrombosis and embolism: Secondary | ICD-10-CM

## 2017-09-19 DIAGNOSIS — F1410 Cocaine abuse, uncomplicated: Secondary | ICD-10-CM | POA: Diagnosis present

## 2017-09-19 DIAGNOSIS — E785 Hyperlipidemia, unspecified: Secondary | ICD-10-CM | POA: Diagnosis present

## 2017-09-19 DIAGNOSIS — M797 Fibromyalgia: Secondary | ICD-10-CM | POA: Diagnosis present

## 2017-09-19 DIAGNOSIS — G4733 Obstructive sleep apnea (adult) (pediatric): Secondary | ICD-10-CM | POA: Diagnosis present

## 2017-09-19 DIAGNOSIS — Z79899 Other long term (current) drug therapy: Secondary | ICD-10-CM

## 2017-09-19 DIAGNOSIS — E119 Type 2 diabetes mellitus without complications: Secondary | ICD-10-CM | POA: Diagnosis present

## 2017-09-19 DIAGNOSIS — K3184 Gastroparesis: Secondary | ICD-10-CM | POA: Diagnosis present

## 2017-09-19 DIAGNOSIS — Z885 Allergy status to narcotic agent status: Secondary | ICD-10-CM

## 2017-09-19 DIAGNOSIS — Z794 Long term (current) use of insulin: Secondary | ICD-10-CM

## 2017-09-19 DIAGNOSIS — Z955 Presence of coronary angioplasty implant and graft: Secondary | ICD-10-CM

## 2017-09-19 LAB — CBC
HCT: 37.6 % (ref 36.0–46.0)
HEMOGLOBIN: 13.1 g/dL (ref 12.0–15.0)
MCH: 26 pg (ref 26.0–34.0)
MCHC: 34.8 g/dL (ref 30.0–36.0)
MCV: 74.8 fL — AB (ref 78.0–100.0)
Platelets: 482 10*3/uL — ABNORMAL HIGH (ref 150–400)
RBC: 5.03 MIL/uL (ref 3.87–5.11)
RDW: 15.9 % — AB (ref 11.5–15.5)
WBC: 15.9 10*3/uL — ABNORMAL HIGH (ref 4.0–10.5)

## 2017-09-19 LAB — BLOOD GAS, VENOUS
Acid-base deficit: 4.1 mmol/L — ABNORMAL HIGH (ref 0.0–2.0)
BICARBONATE: 20.3 mmol/L (ref 20.0–28.0)
Drawn by: 43822
O2 SAT: 51.6 %
PATIENT TEMPERATURE: 98.6
PCO2 VEN: 37.1 mmHg — AB (ref 44.0–60.0)
pH, Ven: 7.357 (ref 7.250–7.430)
pO2, Ven: 31.8 mmHg — CL (ref 32.0–45.0)

## 2017-09-19 LAB — COMPREHENSIVE METABOLIC PANEL
ALBUMIN: 4 g/dL (ref 3.5–5.0)
ALT: 9 U/L — ABNORMAL LOW (ref 14–54)
ANION GAP: 11 (ref 5–15)
AST: 17 U/L (ref 15–41)
Alkaline Phosphatase: 80 U/L (ref 38–126)
BUN: 13 mg/dL (ref 6–20)
CHLORIDE: 101 mmol/L (ref 101–111)
CO2: 24 mmol/L (ref 22–32)
Calcium: 9.5 mg/dL (ref 8.9–10.3)
Creatinine, Ser: 1.05 mg/dL — ABNORMAL HIGH (ref 0.44–1.00)
GFR calc Af Amer: >60 mL/min (ref >60)
GFR calc non Af Amer: >60 mL/min (ref >60)
GLUCOSE: 343 mg/dL — AB (ref 65–99)
POTASSIUM: 3.8 mmol/L (ref 3.5–5.1)
SODIUM: 136 mmol/L (ref 135–145)
Total Bilirubin: 0.5 mg/dL (ref 0.3–1.2)
Total Protein: 9.3 g/dL — ABNORMAL HIGH (ref 6.5–8.1)

## 2017-09-19 LAB — I-STAT BETA HCG BLOOD, ED (MC, WL, AP ONLY): I-stat hCG, quantitative: <5 m[IU]/mL (ref <5)

## 2017-09-19 LAB — LIPASE, BLOOD: LIPASE: 36 U/L (ref 11–51)

## 2017-09-19 MED ORDER — METOCLOPRAMIDE HCL 5 MG/ML IJ SOLN
10.0000 mg | Freq: Once | INTRAMUSCULAR | Status: AC
  Administered 2017-09-19: 10 mg via INTRAVENOUS
  Filled 2017-09-19: qty 2

## 2017-09-19 MED ORDER — HALOPERIDOL LACTATE 5 MG/ML IJ SOLN
2.0000 mg | Freq: Once | INTRAMUSCULAR | Status: AC
  Administered 2017-09-19: 2 mg via INTRAVENOUS
  Filled 2017-09-19: qty 1

## 2017-09-19 MED ORDER — SODIUM CHLORIDE 0.9 % IV BOLUS
1000.0000 mL | Freq: Once | INTRAVENOUS | Status: AC
  Administered 2017-09-19: 1000 mL via INTRAVENOUS

## 2017-09-19 MED ORDER — ONDANSETRON 4 MG PO TBDP
4.0000 mg | ORAL_TABLET | Freq: Once | ORAL | Status: AC | PRN
  Administered 2017-09-19: 4 mg via ORAL
  Filled 2017-09-19: qty 1

## 2017-09-19 NOTE — ED Triage Notes (Signed)
Patient c/o left lower abdominal pain and vomiting x 2 days. Patient denies any diarrhea.

## 2017-09-19 NOTE — ED Notes (Signed)
Unable to get VS due to pt erratic movements. Pt will not cooperate with staff.

## 2017-09-19 NOTE — ED Notes (Addendum)
Pt is actively vomiting on the floor. MD made aware see MAR.  Vomit pink tinged. MD made aware also.

## 2017-09-19 NOTE — ED Provider Notes (Signed)
[facility] DEPT Provider Note   CSN: [id number] Arrival date & time: 09/19/17  1441     History   Chief Complaint Chief Complaint  Patient presents with  . Abdominal Pain  . Emesis    HPI Shirley [name] is a 42 y.o. female.  HPI Patient is a 42 year old female with history of poorly controlled DM, the PE on Eliquis, CAD status post stenting, CVA, cocaine use, who returns to ED with acute exacerbation of chronic nausea/vomiting/abdominal pain for one day. Patient describes diffuse abdominal pain, frequent vomiting, nausea starting today. She does admit to not using her insulin since she was discharged from the hospital without any syringes. She reports that her abdominal pain feels similar to previous admission on 5/12. She denies any chest pain, shortness of breath, urinary symptoms, or vaginal discharge.  Past Medical History:  Diagnosis Date  . Abscess of tunica vaginalis    10/09- Abundant S. aureus- sensitive to all abx  . Anxiety   . Blood dyscrasia   . CAD (coronary artery disease) 06/15/2006   s/p Subendocardial MI with PDA angioplasty(no stent) on 06/15/06 and relook  cath 06/19/06 showed patency of site. Cath 12/10- no restenosis or significant CAD progression  . CVA (cerebral vascular accident) ([place]) ~ 02/2014   denies residual on 04/22/2014  . CVA (cerebral vascular accident) [facility])    history of remote right cerebellar infarct noted on head CT at least since 10/2011  . Depression   . Diabetes mellitus type 2, uncontrolled, with complications ([place])   . Fibromyalgia   . Gastritis   . Gastroparesis    secondary to poorly controlled DM, last emptying study performed 01/2010  was normal but may be falsely positive as pt was on reglan  . GERD (gastroesophageal reflux disease)   . Hepatitis B, chronic (HCC)    Hep BeAb+,Hep B cAb+ & Hep BsAg+ (9/06)  . History of pyelonephritis    H/o GrpB Pyelonephritis (9/06) and UTI- 07/11- E.Coli,  12/10- GBS  . Hyperlipidemia   . Hypertension   . Iron deficiency anemia   . Irregular menses    Small ovarian follicles seen on YQ(6/57)  . MI (myocardial infarction) ([place]) 05/2006   PDA percutaneous transluminal coronary angioplasty  . Migraine    "weekly" (04/22/2014)  . N&V (nausea and vomiting)    Chronic. Unclear etiology with multiple admission and ED visits. CT abdomen with and without contrast (02/2011)  showed no acute process. Gastic Emptying scan (01/2010) was normal. Ultrasound of the abdomen was within normal limits. Hepatitis B viral load was undectable. HIV NR. EGD - gastritis, Hpylori + s/p Rx  . Obesity   . OSA (obstructive sleep apnea)    "suppose to wear mask but I don't" (04/22/2014)  . Peripheral neuropathy   . Pneumonia    "this is probably the 2nd or 3rd time I've had pneumonia" (04/22/2014)  . Recurrent boils   . Seasonal asthma   . Substance abuse ([place])   . Thrombocytosis ([place])    Hem/Onc suggested 2/2 chronic hepatits and/or iron deficiency anemia    Patient Active Problem List   Diagnosis Date Noted  . Nausea & vomiting 09/20/2017  . Intractable nausea and vomiting 08/08/2017  . Chronic maxillary sinusitis 01/02/2017  . Acute blood loss anemia 11/13/2016  . Menorrhagia 11/13/2016  . Bilateral pulmonary embolism ([place]) 10/30/2016  . Acute DVT (deep venous thrombosis) ([place]) 10/30/2016  . Nausea and vomiting 09/23/2016  . Type 2 diabetes mellitus  with hyperglycemia ([place]) 04/07/2016  . Diabetic gastroparesis ([place]) 04/06/2016  . Dehydration 03/27/2015  . Gastroparesis 11/11/2014  . Hematemesis 10/13/2014  . DKA (diabetic ketoacidoses) ([place]) 10/13/2014  . Abdominal pain, chronic, left lower quadrant   . Diabetic gastroparesis associated with type 2 diabetes mellitus ([place]) 04/28/2014  . History of Helicobacter pylori infection 04/22/2014  . Vaginal discharge 02/18/2014  . Unspecified constipation 07/21/2013  . Tinea corporis 07/21/2013  . Intractable  vomiting 04/29/2013  . Dysmenorrhea 04/22/2013  . UTI (urinary tract infection) 07/15/2012  . Headache(784.0) 02/08/2012  . Health care maintenance 01/22/2012  . Chronic hepatitis B ([place]) 03/07/2011  . History of leukocytosis 04/06/2010  . THROMBOCYTOSIS 04/06/2010  . Polysubstance abuse ([place]) 02/23/2010  . Iron deficiency anemia 11/22/2009  . PERIPHERAL NEUROPATHY 10/01/2009  . Hyperlipidemia 08/30/2009  . Diabetic polyneuropathy ([place]) 08/30/2009  . Hidradenitis (recurrent boils) 07/07/2008  . Depression 12/27/2007  . Abdominal pain, left lower quadrant 11/21/2007  . FIBROMYALGIA 10/30/2007  . BACK PAIN 04/01/2007  . OBSTRUCTIVE SLEEP APNEA 01/17/2007  . ANXIETY DEPRESSION 06/27/2006  . Chronic ischemic heart disease 06/15/2006  . OBESITY, MORBID 05/15/2006  . MIGRAINE HEADACHE 05/15/2006  . Asthma 05/15/2006  . Essential hypertension 01/16/2006  . IRREGULAR MENSTRUATION 01/16/2006  . PEDAL EDEMA 01/16/2006  . Poorly controlled type 2 diabetes mellitus with peripheral neuropathy ([place]) 01/16/1989    Past Surgical History:  Procedure Laterality Date  . CESAREAN SECTION  1997  . CORONARY ANGIOPLASTY WITH STENT PLACEMENT  2008   "2 stents"  . ESOPHAGOGASTRODUODENOSCOPY N/A 04/23/2014   Procedure: ESOPHAGOGASTRODUODENOSCOPY (EGD);  Surgeon: Winfield Cunas., MD;  Location: [facility] ENDOSCOPY;  Service: Endoscopy;  Laterality: N/A;  . IR ANGIOGRAM PELVIS SELECTIVE OR SUPRASELECTIVE  12/07/2016  . IR ANGIOGRAM PELVIS SELECTIVE OR SUPRASELECTIVE  12/07/2016  . IR ANGIOGRAM SELECTIVE EACH ADDITIONAL VESSEL  12/07/2016  . IR ANGIOGRAM SELECTIVE EACH ADDITIONAL VESSEL  12/07/2016  . IR EMBO ARTERIAL NOT HEMORR HEMANG INC GUIDE ROADMAPPING  12/07/2016  . IR RADIOLOGIST EVAL & MGMT  01/09/2017  . IR US GUIDE VASC ACCESS RIGHT  12/07/2016     OB History    Gravida  2   Para  2   Term  2   Preterm      AB      Living        SAB      TAB      Ectopic      Multiple      Live  Births  2            Home Medications    Prior to Admission medications   Medication Sig Start Date End Date Taking? Authorizing Provider  albuterol (PROVENTIL HFA;VENTOLIN HFA) 108 (90 Base) MCG/ACT inhaler Inhale 2 puffs into the lungs every 4 (four) hours as needed for wheezing or shortness of breath. 09/27/16   Clent Demark, PA-C  apixaban (ELIQUIS) 5 MG TABS tablet Take 1 tablet (5 mg total) by mouth 2 (two) times daily. 08/13/17   Tawny Asal, MD  erythromycin ethylsuccinate (EES) 200 MG/5ML suspension Take 5 mLs (200 mg total) by mouth 3 (three) times daily. Take 15 minutes before a meal 09/04/17 10/04/17  Colbert Ewing, MD  feeding supplement, GLUCERNA SHAKE, (GLUCERNA SHAKE) LIQD Take 237 mLs by mouth 3 (three) times daily between meals. 09/04/17   Colbert Ewing, MD  glucose blood (FREESTYLE TEST STRIPS) test strip Use as instructed 09/04/17   Colbert Ewing, MD  glucose monitoring kit (  FREESTYLE) monitoring kit 1 each by Does not apply route 4 (four) times daily - after meals and at bedtime. 1 month Diabetic Testing Supplies for QAC-QHS accuchecks. 09/04/17   Colbert Ewing, MD  hydrochlorothiazide (HYDRODIURIL) 25 MG tablet Take 1 tablet (25 mg total) by mouth daily. Take on tablet in the morning. 08/13/17   Tawny Asal, MD  insulin NPH Human (HUMULIN N) 100 UNIT/ML injection Inject 0.12 mLs (12 Units total) into the skin 2 (two) times daily before a meal. 09/04/17   Colbert Ewing, MD  INSULIN SYRINGE .5CC/28G (INS SYRINGE/NEEDLE .5CC/28G) 28G X 1/2" 0.5 ML MISC Please provide 1 month supply 09/04/17   Colbert Ewing, MD  Lancets (FREESTYLE) lancets Use as instructed 09/04/17   Colbert Ewing, MD  metoprolol tartrate (LOPRESSOR) 100 MG tablet Take 1 tablet (100 mg total) by mouth 2 (two) times daily. 08/13/17   Tawny Asal, MD  oxyCODONE-acetaminophen (PERCOCET) 10-325 MG tablet Take 1 tablet by mouth every 8 (eight) hours as needed. for pain 09/11/17   [provider]  pantoprazole (PROTONIX) 40 MG tablet Take 1 tablet (40 mg total) by mouth daily. 09/04/17   Colbert Ewing, MD  promethazine (PHENERGAN) 25 MG tablet Take 1 tablet (25 mg total) by mouth every 6 (six) hours as needed for nausea or vomiting. 09/04/17   Colbert Ewing, MD    Family History Family History  Problem Relation Age of Onset  . Diabetes Father     Social History Social History   Tobacco Use  . Smoking status: Former Smoker    Types: Cigarettes    Last attempt to quit: 04/24/1996    Years since quitting: 21.4  . Smokeless tobacco: Never Used  . Tobacco comment: quit smoking cigarettes age 58  Substance Use Topics  . Alcohol use: No    Alcohol/week: 0.0 oz    Comment: 04/22/2014 "might have a few drinks a month"  . Drug use: Not Currently    Types: Marijuana, Cocaine    Comment: 04/22/2104 "quit drugs ~ 1-2 yr ago"     Allergies   Lisinopril and Morphine and related   Review of Systems Review of Systems  Constitutional: Negative for chills and fever.  HENT: Negative for ear pain and sore throat.   Eyes: Negative for pain and visual disturbance.  Respiratory: Negative for cough and shortness of breath.   Cardiovascular: Negative for chest pain and palpitations.  Gastrointestinal: Positive for abdominal pain, nausea and vomiting.  Genitourinary: Negative for dysuria and hematuria.  Musculoskeletal: Negative for arthralgias and back pain.  Skin: Negative for color change and rash.  Neurological: Negative for seizures and syncope.  All other systems reviewed and are negative.    Physical Exam Updated Vital Signs BP (!) 187/99   Pulse (!) 109   Temp 97.8 F (36.6 C) (Oral)   Resp 18   Ht _0  (1.702 m)   Wt 88.9 kg (196 lb)   SpO2 98%   BMI 30.70 kg/m   Physical Exam  Constitutional: She appears well-developed and well-nourished. She appears distressed.  HENT:  Head: Normocephalic and atraumatic.  Mouth/Throat: Mucous membranes are dry.  Eyes:  Conjunctivae are normal.  Neck: Neck supple.  Cardiovascular: Normal rate and regular rhythm.  No murmur heard. Pulmonary/Chest: Effort normal and breath sounds normal. No respiratory distress.  Abdominal: Soft. There is tenderness (diffuse).  Musculoskeletal: She exhibits no edema.  Neurological: She is alert.  Skin: Skin is warm and dry.  Psychiatric: She has a  normal mood and affect.  Nursing note and vitals reviewed.    ED Treatments / Results  Labs (all labs ordered are listed, but only abnormal results are displayed) Labs Reviewed  COMPREHENSIVE METABOLIC PANEL - Abnormal; Notable for the following components:      Result Value   Glucose, Bld 343 (*)    Creatinine, Ser 1.05 (*)    Total Protein 9.3 (*)    ALT 9 (*)    All other components within normal limits  CBC - Abnormal; Notable for the following components:   WBC 15.9 (*)    MCV 74.8 (*)    RDW 15.9 (*)    Platelets 482 (*)    All other components within normal limits  BLOOD GAS, VENOUS - Abnormal; Notable for the following components:   pCO2, Ven 37.1 (*)    pO2, Ven 31.8 (*)    Acid-base deficit 4.1 (*)    All other components within normal limits  LIPASE, BLOOD  URINALYSIS, ROUTINE W REFLEX MICROSCOPIC  RAPID URINE DRUG SCREEN, HOSP PERFORMED  I-STAT BETA HCG BLOOD, ED ([facility], [facility], [facility] ONLY)    EKG None  Radiology No results found.  Procedures Procedures (including critical care time)  Medications Ordered in ED Medications  ondansetron (ZOFRAN) injection 4 mg (has no administration in time range)  ondansetron (ZOFRAN-ODT) disintegrating tablet 4 mg (4 mg Oral Given 09/19/17 1620)  sodium chloride 0.9 % bolus 1,000 mL (0 mLs Intravenous Stopped 09/19/17 2200)  haloperidol lactate (HALDOL) injection 2 mg (2 mg Intravenous Given 09/19/17 2051)  metoCLOPramide (REGLAN) injection 10 mg (10 mg Intravenous Given 09/19/17 2331)  sodium chloride 0.9 % bolus 1,000 mL (1,000 mLs Intravenous New Bag/Given 09/19/17  2331)     Initial Impression / Assessment and Plan / ED Course  I have reviewed the triage vital signs and the nursing notes.  Pertinent labs & imaging results that were available during my care of the patient were reviewed by me and considered in my medical decision making (see chart for details).    Patient is a 42 year old female with history of poorly controlled DM, the PE on Eliquis, CAD status post stenting, CVA, cocaine use, who returns to ED with acute exacerbation of chronic nausea/vomiting/abdominal pain for one day. Tachycardic on arrival, distressed, writhing around bed.   Records reviewed, patient admitted 5/11-5/14 for vomiting and acute on chronic abdominal pain. Etiology thought secondary to diabetic gastroparesis vs fibroids/endometriosis. Discharged with erythromycin and protonix. Patient has been noncompliant with this therapy since discharge.  Labs stable from previous. No evidence of acute abdominal process at this time. No indication for further imaging. Suspect recurrent gastroparesis. Patient treated with IV fluids, Zofran, Haldol, and Reglan. She continued to have persistent abdominal pain, nausea and vomiting. Discussed with internal medicine for admission. Transferred [facility].   Patient and plan of care discussed with Attending physician, Dr. Vanita Panda.    Final Clinical Impressions(s) / ED Diagnoses   Final diagnoses:  Nausea and vomiting, intractability of vomiting not specified, unspecified vomiting type  Generalized abdominal pain    ED Discharge Orders    None       Arnetha Massy, MD 09/20/17 0105    Carmin Muskrat, MD 09/22/17 [phone]

## 2017-09-19 NOTE — ED Notes (Signed)
Pt removed the BP cuff and SPO2. Pt is rhythmically moving again. Is unable to sit still. Will not cooperate with staff.

## 2017-09-19 NOTE — ED Notes (Signed)
Pt was found on the bed rhythmically moving due to pain. She stated the pain started after the vomiting today.

## 2017-09-20 ENCOUNTER — Encounter (HOSPITAL_COMMUNITY): Payer: Self-pay | Admitting: Internal Medicine

## 2017-09-20 DIAGNOSIS — R112 Nausea with vomiting, unspecified: Secondary | ICD-10-CM | POA: Diagnosis present

## 2017-09-20 DIAGNOSIS — I160 Hypertensive urgency: Secondary | ICD-10-CM | POA: Diagnosis present

## 2017-09-20 LAB — CBC WITH DIFFERENTIAL/PLATELET
BASOS ABS: 0 10*3/uL (ref 0.0–0.1)
Basophils Relative: 0 %
Eosinophils Absolute: 0 10*3/uL (ref 0.0–0.7)
Eosinophils Relative: 0 %
HEMATOCRIT: 36 % (ref 36.0–46.0)
HEMOGLOBIN: 12.4 g/dL (ref 12.0–15.0)
LYMPHS ABS: 1.5 10*3/uL (ref 0.7–4.0)
LYMPHS PCT: 10 %
MCH: 26.3 pg (ref 26.0–34.0)
MCHC: 34.4 g/dL (ref 30.0–36.0)
MCV: 76.4 fL — AB (ref 78.0–100.0)
Monocytes Absolute: 0.5 10*3/uL (ref 0.1–1.0)
Monocytes Relative: 3 %
NEUTROS ABS: 12.8 10*3/uL — AB (ref 1.7–7.7)
NEUTROS PCT: 87 %
Platelets: 378 10*3/uL (ref 150–400)
RBC: 4.71 MIL/uL (ref 3.87–5.11)
RDW: 15.9 % — ABNORMAL HIGH (ref 11.5–15.5)
WBC: 14.8 10*3/uL — AB (ref 4.0–10.5)

## 2017-09-20 LAB — APTT: aPTT: 24 seconds (ref 24–36)

## 2017-09-20 LAB — BASIC METABOLIC PANEL
ANION GAP: 11 (ref 5–15)
BUN: 11 mg/dL (ref 6–20)
CHLORIDE: 103 mmol/L (ref 101–111)
CO2: 23 mmol/L (ref 22–32)
Calcium: 8.9 mg/dL (ref 8.9–10.3)
Creatinine, Ser: 0.97 mg/dL (ref 0.44–1.00)
GFR calc Af Amer: >60 mL/min (ref >60)
Glucose, Bld: 238 mg/dL — ABNORMAL HIGH (ref 65–99)
Potassium: 4.1 mmol/L (ref 3.5–5.1)
SODIUM: 137 mmol/L (ref 135–145)

## 2017-09-20 LAB — RAPID URINE DRUG SCREEN, HOSP PERFORMED
AMPHETAMINES: NOT DETECTED
BENZODIAZEPINES: NOT DETECTED
Barbiturates: NOT DETECTED
COCAINE: POSITIVE — AB
Opiates: NOT DETECTED
TETRAHYDROCANNABINOL: NOT DETECTED

## 2017-09-20 LAB — HEPATIC FUNCTION PANEL
ALBUMIN: 3.3 g/dL — AB (ref 3.5–5.0)
ALK PHOS: 70 U/L (ref 38–126)
ALT: 10 U/L — ABNORMAL LOW (ref 14–54)
AST: 22 U/L (ref 15–41)
BILIRUBIN INDIRECT: 0.5 mg/dL (ref 0.3–0.9)
Bilirubin, Direct: 0.1 mg/dL (ref 0.1–0.5)
TOTAL PROTEIN: 7.8 g/dL (ref 6.5–8.1)
Total Bilirubin: 0.6 mg/dL (ref 0.3–1.2)

## 2017-09-20 LAB — PROTIME-INR
INR: 1.07
PROTHROMBIN TIME: 13.8 s (ref 11.4–15.2)

## 2017-09-20 LAB — URINALYSIS, ROUTINE W REFLEX MICROSCOPIC
BILIRUBIN URINE: NEGATIVE
Glucose, UA: >=500 mg/dL — AB
Hgb urine dipstick: NEGATIVE
Ketones, ur: 5 mg/dL — AB
LEUKOCYTES UA: NEGATIVE
Nitrite: NEGATIVE
PH: 7 (ref 5.0–8.0)
Protein, ur: 100 mg/dL — AB
SPECIFIC GRAVITY, URINE: 1.014 (ref 1.005–1.030)

## 2017-09-20 LAB — CBG MONITORING, ED
Glucose-Capillary: 216 mg/dL — ABNORMAL HIGH (ref 65–99)
Glucose-Capillary: 202 mg/dL — ABNORMAL HIGH (ref 65–99)

## 2017-09-20 LAB — HEPARIN LEVEL (UNFRACTIONATED)
HEPARIN UNFRACTIONATED: 0.37 [IU]/mL (ref 0.30–0.70)
HEPARIN UNFRACTIONATED: 0.41 [IU]/mL (ref 0.30–0.70)
Heparin Unfractionated: <0.1 IU/mL — ABNORMAL LOW (ref 0.30–0.70)

## 2017-09-20 LAB — GLUCOSE, CAPILLARY: Glucose-Capillary: 259 mg/dL — ABNORMAL HIGH (ref 65–99)

## 2017-09-20 MED ORDER — FLUTICASONE PROPIONATE 50 MCG/ACT NA SUSP
1.0000 | Freq: Two times a day (BID) | NASAL | Status: DC
  Administered 2017-09-20 – 2017-09-23 (×6): 1 via NASAL
  Filled 2017-09-20: qty 16

## 2017-09-20 MED ORDER — LABETALOL HCL 5 MG/ML IV SOLN
10.0000 mg | INTRAVENOUS | Status: DC | PRN
  Filled 2017-09-20 (×2): qty 4

## 2017-09-20 MED ORDER — SODIUM CHLORIDE 0.9 % IV SOLN: Freq: Once | INTRAVENOUS | Status: DC

## 2017-09-20 MED ORDER — SODIUM CHLORIDE 0.9 % IV SOLN
INTRAVENOUS | Status: DC
  Administered 2017-09-20: 09:00:00 via INTRAVENOUS

## 2017-09-20 MED ORDER — METOPROLOL TARTRATE 50 MG PO TABS
100.0000 mg | ORAL_TABLET | Freq: Two times a day (BID) | ORAL | Status: DC
  Administered 2017-09-20 – 2017-09-23 (×7): 100 mg via ORAL
  Filled 2017-09-20 (×4): qty 2
  Filled 2017-09-20: qty 4
  Filled 2017-09-20 (×3): qty 2

## 2017-09-20 MED ORDER — PANTOPRAZOLE SODIUM 40 MG PO TBEC
40.0000 mg | DELAYED_RELEASE_TABLET | Freq: Every day | ORAL | Status: DC
  Administered 2017-09-20 – 2017-09-21 (×2): 40 mg via ORAL
  Filled 2017-09-20 (×2): qty 1

## 2017-09-20 MED ORDER — HYDRALAZINE HCL 20 MG/ML IJ SOLN
10.0000 mg | INTRAMUSCULAR | Status: DC | PRN
  Administered 2017-09-20: 10 mg via INTRAVENOUS
  Filled 2017-09-20: qty 1

## 2017-09-20 MED ORDER — ALBUTEROL SULFATE (2.5 MG/3ML) 0.083% IN NEBU: 2.5000 mg | INHALATION_SOLUTION | RESPIRATORY_TRACT | Status: DC | PRN

## 2017-09-20 MED ORDER — ACETAMINOPHEN 325 MG PO TABS
650.0000 mg | ORAL_TABLET | Freq: Four times a day (QID) | ORAL | Status: DC | PRN
  Administered 2017-09-20 – 2017-09-23 (×4): 650 mg via ORAL
  Filled 2017-09-20 (×4): qty 2

## 2017-09-20 MED ORDER — METOCLOPRAMIDE HCL 5 MG/ML IJ SOLN
5.0000 mg | Freq: Four times a day (QID) | INTRAMUSCULAR | Status: AC
  Administered 2017-09-20: 5 mg via INTRAVENOUS
  Filled 2017-09-20: qty 2

## 2017-09-20 MED ORDER — PROMETHAZINE HCL 25 MG PO TABS
25.0000 mg | ORAL_TABLET | Freq: Four times a day (QID) | ORAL | Status: DC | PRN
  Administered 2017-09-21: 25 mg via ORAL
  Filled 2017-09-20: qty 1

## 2017-09-20 MED ORDER — ONDANSETRON HCL 4 MG PO TABS
4.0000 mg | ORAL_TABLET | Freq: Four times a day (QID) | ORAL | Status: DC | PRN
  Administered 2017-09-20 – 2017-09-23 (×3): 4 mg via ORAL
  Filled 2017-09-20 (×3): qty 1

## 2017-09-20 MED ORDER — APIXABAN 5 MG PO TABS: 5.0000 mg | ORAL_TABLET | Freq: Two times a day (BID) | ORAL | Status: DC

## 2017-09-20 MED ORDER — INSULIN NPH (HUMAN) (ISOPHANE) 100 UNIT/ML ~~LOC~~ SUSP
12.0000 [IU] | Freq: Two times a day (BID) | SUBCUTANEOUS | Status: DC
  Administered 2017-09-21 – 2017-09-23 (×4): 12 [IU] via SUBCUTANEOUS
  Filled 2017-09-20: qty 10

## 2017-09-20 MED ORDER — ONDANSETRON HCL 4 MG/2ML IJ SOLN
4.0000 mg | Freq: Three times a day (TID) | INTRAMUSCULAR | Status: DC | PRN
  Administered 2017-09-20: 4 mg via INTRAVENOUS
  Filled 2017-09-20: qty 2

## 2017-09-20 MED ORDER — GLUCERNA SHAKE PO LIQD
237.0000 mL | Freq: Three times a day (TID) | ORAL | Status: DC
  Administered 2017-09-20 – 2017-09-23 (×3): 237 mL via ORAL
  Filled 2017-09-20 (×10): qty 237

## 2017-09-20 MED ORDER — INSULIN ASPART 100 UNIT/ML ~~LOC~~ SOLN
0.0000 [IU] | Freq: Three times a day (TID) | SUBCUTANEOUS | Status: DC
  Administered 2017-09-20: 3 [IU] via SUBCUTANEOUS
  Administered 2017-09-20: 5 [IU] via SUBCUTANEOUS
  Administered 2017-09-21: 3 [IU] via SUBCUTANEOUS
  Filled 2017-09-20: qty 1

## 2017-09-20 MED ORDER — ONDANSETRON HCL 4 MG/2ML IJ SOLN
4.0000 mg | Freq: Four times a day (QID) | INTRAMUSCULAR | Status: DC | PRN
  Administered 2017-09-20 – 2017-09-21 (×3): 4 mg via INTRAVENOUS
  Filled 2017-09-20 (×3): qty 2

## 2017-09-20 MED ORDER — ACETAMINOPHEN 650 MG RE SUPP: 650.0000 mg | Freq: Four times a day (QID) | RECTAL | Status: DC | PRN

## 2017-09-20 MED ORDER — PROMETHAZINE HCL 25 MG/ML IJ SOLN
12.5000 mg | Freq: Once | INTRAMUSCULAR | Status: AC
  Administered 2017-09-20: 12.5 mg via INTRAVENOUS
  Filled 2017-09-20: qty 1

## 2017-09-20 MED ORDER — ALBUTEROL SULFATE HFA 108 (90 BASE) MCG/ACT IN AERS: 2.0000 | INHALATION_SPRAY | RESPIRATORY_TRACT | Status: DC | PRN

## 2017-09-20 MED ORDER — HEPARIN (PORCINE) IN NACL 100-0.45 UNIT/ML-% IJ SOLN
1300.0000 [IU]/h | INTRAMUSCULAR | Status: DC
  Administered 2017-09-20 – 2017-09-22 (×3): 1300 [IU]/h via INTRAVENOUS
  Filled 2017-09-20 (×3): qty 250

## 2017-09-20 NOTE — H&P (Signed)
History and Physical    Shirley [name] ZOX:[id number] DOB: [birth year] DOA: 09/19/2017  PCP: Clent Demark, PA-C  Patient coming from: Home.  Chief Complaint: Nausea vomiting.  HPI: Shirley [name] is a 42 y.o. female with history of diabetes mellitus type 2, hypertension, history of PE, history of CAD, history of CVA presents to the ER with complaints of persistent nausea vomiting.  Patient states over the last 3 days patient has been unable to keep in anything.  Has diffuse abdominal pain.  Denies any chest pain shortness of breath headache or visual symptoms.  Has not taken her insulin for last 1 week since she ran out of her syringes.  Has been recently admitted to teaching service for similar complaints.  ED Course: In the ER patient was found to be hypertensive with blood pressure more than 811 systolic.  Patient had been having recurrent episodes of vomiting despite antiemetics.  UA and urine drug screen are pending.  Patient admitted for further observation.  Blood sugar is elevated.  Abdomen appears benign on exam.  LFTs are normal.  Review of Systems: As per HPI, rest all negative.   Past Medical History:  Diagnosis Date  . Abscess of tunica vaginalis    10/09- Abundant S. aureus- sensitive to all abx  . Anxiety   . Blood dyscrasia   . CAD (coronary artery disease) 06/15/2006   s/p Subendocardial MI with PDA angioplasty(no stent) on 06/15/06 and relook  cath 06/19/06 showed patency of site. Cath 12/10- no restenosis or significant CAD progression  . CVA (cerebral vascular accident) ([place]) ~ 02/2014   denies residual on 04/22/2014  . CVA (cerebral vascular accident) [facility])    history of remote right cerebellar infarct noted on head CT at least since 10/2011  . Depression   . Diabetes mellitus type 2, uncontrolled, with complications ([place])   . Fibromyalgia   . Gastritis   . Gastroparesis    secondary to poorly controlled DM, last emptying study performed 01/2010  was  normal but may be falsely positive as pt was on reglan  . GERD (gastroesophageal reflux disease)   . Hepatitis B, chronic (HCC)    Hep BeAb+,Hep B cAb+ & Hep BsAg+ (9/06)  . History of pyelonephritis    H/o GrpB Pyelonephritis (9/06) and UTI- 07/11- E.Coli, 12/10- GBS  . Hyperlipidemia   . Hypertension   . Iron deficiency anemia   . Irregular menses    Small ovarian follicles seen on BJ(4/78)  . MI (myocardial infarction) ([place]) 05/2006   PDA percutaneous transluminal coronary angioplasty  . Migraine    "weekly" (04/22/2014)  . N&V (nausea and vomiting)    Chronic. Unclear etiology with multiple admission and ED visits. CT abdomen with and without contrast (02/2011)  showed no acute process. Gastic Emptying scan (01/2010) was normal. Ultrasound of the abdomen was within normal limits. Hepatitis B viral load was undectable. HIV NR. EGD - gastritis, Hpylori + s/p Rx  . Obesity   . OSA (obstructive sleep apnea)    "suppose to wear mask but I don't" (04/22/2014)  . Peripheral neuropathy   . Pneumonia    "this is probably the 2nd or 3rd time I've had pneumonia" (04/22/2014)  . Recurrent boils   . Seasonal asthma   . Substance abuse ([place])   . Thrombocytosis ([place])    Hem/Onc suggested 2/2 chronic hepatits and/or iron deficiency anemia    Past Surgical History:  Procedure Laterality Date  . CESAREAN SECTION  [place]  2008   "2 stents"  . ESOPHAGOGASTRODUODENOSCOPY N/A 04/23/2014   Procedure: ESOPHAGOGASTRODUODENOSCOPY (EGD);  Surgeon: Winfield Cunas., MD;  Location[facility] ENDOSCOPY;  Service: Endoscopy;  Laterality: N/A;  . IR ANGIOGRAM PELVIS SELECTIVE OR SUPRASELECTIVE  12/07/2016  . IR ANGIOGRAM PELVIS SELECTIVE OR SUPRASELECTIVE  12/07/2016  . IR ANGIOGRAM SELECTIVE EACH ADDITIONAL VESSEL  12/07/2016  . IR ANGIOGRAM SELECTIVE EACH ADDITIONAL VESSEL  12/07/2016  . IR EMBO ARTERIAL NOT HEMORR HEMANG INC GUIDE ROADMAPPING  12/07/2016  . IR  RADIOLOGIST EVAL & MGMT  01/09/2017  . IR US GUIDE VASC ACCESS RIGHT  12/07/2016     reports that she quit smoking about 21 years ago. Her smoking use included cigarettes. She has never used smokeless tobacco. She reports that she has current or past drug history. Drugs: Marijuana and Cocaine. She reports that she does not drink alcohol.  Allergies  Allergen Reactions  . Lisinopril Nausea Only and Swelling  . Morphine And Related Itching and Swelling    Family History  Problem Relation Age of Onset  . Diabetes Father     Prior to Admission medications   Medication Sig Start Date End Date Taking? Authorizing Provider  albuterol (PROVENTIL HFA;VENTOLIN HFA) 108 (90 Base) MCG/ACT inhaler Inhale 2 puffs into the lungs every 4 (four) hours as needed for wheezing or shortness of breath. 09/27/16  Yes Clent Demark, PA-C  apixaban (ELIQUIS) 5 MG TABS tablet Take 1 tablet (5 mg total) by mouth 2 (two) times daily. 08/13/17  Yes Tawny Asal, MD  erythromycin ethylsuccinate (EES) 200 MG/5ML suspension Take 5 mLs (200 mg total) by mouth 3 (three) times daily. Take 15 minutes before a meal 09/04/17 10/04/17 Yes Colbert Ewing, MD  feeding supplement, GLUCERNA SHAKE, (GLUCERNA SHAKE) LIQD Take 237 mLs by mouth 3 (three) times daily between meals. 09/04/17  Yes Colbert Ewing, MD  glucose blood (FREESTYLE TEST STRIPS) test strip Use as instructed 09/04/17  Yes Colbert Ewing, MD  glucose monitoring kit (FREESTYLE) monitoring kit 1 each by Does not apply route 4 (four) times daily - after meals and at bedtime. 1 month Diabetic Testing Supplies for QAC-QHS accuchecks. 09/04/17  Yes Colbert Ewing, MD  hydrochlorothiazide (HYDRODIURIL) 25 MG tablet Take 1 tablet (25 mg total) by mouth daily. Take on tablet in the morning. 08/13/17  Yes Tawny Asal, MD  insulin NPH Human (HUMULIN N) 100 UNIT/ML injection Inject 0.12 mLs (12 Units total) into the skin 2 (two) times daily before a meal. Patient taking  differently: Inject 12 Units into the skin 2 (two) times daily before a meal. Sliding scale 09/04/17  Yes Colbert Ewing, MD  INSULIN SYRINGE .5CC/28G (INS SYRINGE/NEEDLE .5CC/28G) 28G X 1/2" 0.5 ML MISC Please provide 1 month supply 09/04/17  Yes Colbert Ewing, MD  Lancets (FREESTYLE) lancets Use as instructed 09/04/17  Yes Colbert Ewing, MD  metoprolol tartrate (LOPRESSOR) 100 MG tablet Take 1 tablet (100 mg total) by mouth 2 (two) times daily. 08/13/17  Yes Tawny Asal, MD  pantoprazole (PROTONIX) 40 MG tablet Take 1 tablet (40 mg total) by mouth daily. 09/04/17  Yes Colbert Ewing, MD  promethazine (PHENERGAN) 25 MG tablet Take 1 tablet (25 mg total) by mouth every 6 (six) hours as needed for nausea or vomiting. 09/04/17  Yes Colbert Ewing, MD    Physical Exam: Vitals:   09/19/17 1615 09/19/17 1901 09/19/17 2200 09/20/17 0215  BP:  105/67 (!) 187/99 (!) 183/113  Pulse:  (!) 120 (!) 109 (!) 111  Resp:  18 18 16   Temp:      TempSrc:      SpO2:  97% 98% 97%  Weight: 88.9 kg (196 lb)     Height: 5' 7"  (1.702 m)         Constitutional: Moderately built and nourished. Vitals:   09/19/17 1615 09/19/17 1901 09/19/17 2200 09/20/17 0215  BP:  105/67 (!) 187/99 (!) 183/113  Pulse:  (!) 120 (!) 109 (!) 111  Resp:  18 18 16   Temp:      TempSrc:      SpO2:  97% 98% 97%  Weight: 88.9 kg (196 lb)     Height: 5' 7"  (1.702 m)      Eyes: Anicteric no pallor. ENMT: No discharge from the ears eyes nose or mouth. Neck: No mass palpated no neck rigidity but no JVD appreciated. Respiratory: No rhonchi or crepitations. Cardiovascular: S1-S2 heard no murmurs appreciated. Abdomen: Soft nontender bowel sounds present. Musculoskeletal: No edema.  No joint effusion. Skin: No rash.  Skin appears warm. Neurologic: Patient is sleeping but arousable and answers questions appropriately moves all extremities 5 x 5. Psychiatric: Patient was sleeping but answers questions appropriately on waking  up.   Labs on Admission: I have personally reviewed following labs and imaging studies  CBC: Recent Labs  Lab 09/19/17 1641  WBC 15.9*  HGB 13.1  HCT 37.6  MCV 74.8*  PLT 703*   Basic Metabolic Panel: Recent Labs  Lab 09/19/17 1641  NA 136  K 3.8  CL 101  CO2 24  GLUCOSE 343*  BUN 13  CREATININE 1.05*  CALCIUM 9.5   GFR: Estimated Creatinine Clearance: 79.1 mL/min (A) (by C-G formula based on SCr of 1.05 mg/dL (H)). Liver Function Tests: Recent Labs  Lab 09/19/17 1641  AST 17  ALT 9*  ALKPHOS 80  BILITOT 0.5  PROT 9.3*  ALBUMIN 4.0   Recent Labs  Lab 09/19/17 1641  LIPASE 36   No results for input(s): AMMONIA in the last 168 hours. Coagulation Profile: No results for input(s): INR, PROTIME in the last 168 hours. Cardiac Enzymes: No results for input(s): CKTOTAL, CKMB, CKMBINDEX, TROPONINI in the last 168 hours. BNP (last 3 results) No results for input(s): PROBNP in the last 8760 hours. HbA1C: No results for input(s): HGBA1C in the last 72 hours. CBG: No results for input(s): GLUCAP in the last 168 hours. Lipid Profile: No results for input(s): CHOL, HDL, LDLCALC, TRIG, CHOLHDL, LDLDIRECT in the last 72 hours. Thyroid Function Tests: No results for input(s): TSH, T4TOTAL, FREET4, T3FREE, THYROIDAB in the last 72 hours. Anemia Panel: No results for input(s): VITAMINB12, FOLATE, FERRITIN, TIBC, IRON, RETICCTPCT in the last 72 hours. Urine analysis:    Component Value Date/Time   COLORURINE AMBER (A) 09/01/2017 1926   APPEARANCEUR CLOUDY (A) 09/01/2017 1926   LABSPEC 1.030 09/01/2017 1926   PHURINE 5.0 09/01/2017 1926   GLUCOSEU 150 (A) 09/01/2017 1926   GLUCOSEU > 1000 mg/dL (A) 07/07/2008 2115   HGBUR LARGE (A) 09/01/2017 1926   HGBUR negative 08/12/2008 1444   BILIRUBINUR NEGATIVE 09/01/2017 1926   BILIRUBINUR neg 02/07/2017 1609   KETONESUR 20 (A) 09/01/2017 1926   PROTEINUR >=300 (A) 09/01/2017 1926   UROBILINOGEN 1.0 02/07/2017 1609    UROBILINOGEN 0.2 11/11/2014 1658   NITRITE NEGATIVE 09/01/2017 1926   LEUKOCYTESUR NEGATIVE 09/01/2017 1926   Sepsis Labs: @LABRCNTIP (procalcitonin:4,lacticidven:4) )No results found for this or any previous visit (  from the past 240 hour(s)).   Radiological Exams on Admission: No results found.    Assessment/Plan Principal Problem:   Nausea & vomiting Active Problems:   Poorly controlled type 2 diabetes mellitus with peripheral neuropathy (HCC)   Polysubstance abuse (HCC)   OBSTRUCTIVE SLEEP APNEA   Bilateral pulmonary embolism (HCC)   Hypertensive urgency    1. Intractable nausea vomiting likely from gastroparesis secondary diabetes mellitus uncontrolled -abdomen appears benign.  Follow LFTs.  Continue antiemetics as needed IV fluids.  I have placed patient on full liquid diet for now.  We will give 2 doses of Reglan. 2. Hypertensive urgency -holding of hydrochlorothiazide due to patient receiving fluids.  Continue metoprolol and I have placed patient on PRN IV labetalol. 3. Diabetes mellitus type 2 uncontrolled with last hemoglobin A1c in April 2019 was 10.2.  Patient has not taken her medications for last 1 week as patient ran out of syringes.  For now I am restarting her NPH with sliding scale coverage.  Closely follow CBGs. 4. History of PE on apixaban.  I will start patient on heparin until patient can relabel to take apixaban.  Urine drug screen and UA are pending.   DVT prophylaxis: Heparin. Code Status: Full code. Family Communication: Discussed with patient. Disposition Plan: Home. Consults called: None. Admission status: Observation.   Rise Patience MD [facility] Pager [phone].  If 7PM-7AM, please contact night-coverage [email or web address] Password [email or web address]  09/20/2017, 6:18 AM

## 2017-09-20 NOTE — ED Notes (Signed)
Unable to administer lopressor at this time. Pt line is running heparin. IV team request placed to get another line.

## 2017-09-20 NOTE — ED Notes (Signed)
Pt to be given lopressor prior to transport upstairs.

## 2017-09-20 NOTE — ED Notes (Signed)
Lowella Fairy, RN also charge to give report and per RN was not comfortable taking pt with elevated HR and BP. Writer made RN aware that additional doses would be administered. While obtaining BP pt is moving non stop kick and wailing in bed and is restless. Spoke with hospitalist who is aware and stated to stop IV fluids at this time and to administer lopressor.

## 2017-09-20 NOTE — ED Notes (Signed)
Called to attempt report to [facility]W and they stated that the room is dirty and there is no bed. Unsure of how long it will take.

## 2017-09-20 NOTE — ED Notes (Signed)
Pt actively vomiting. Going to administer Zofran. Pt still refuses to sit still long enough to apply or obtain V/S.

## 2017-09-20 NOTE — ED Notes (Signed)
Request insulin from pharmacy x 2 / verifiation

## 2017-09-20 NOTE — ED Notes (Signed)
Writer spoke with Dr.  Quincy Simmonds regarding floor concer

## 2017-09-20 NOTE — ED Notes (Signed)
Pt is alert and oriented x 4 and is verbally responsive. Pt reports that she is having LLQ pain 10/10 aching. Pt SO is present in the room. Pt is restless in the room and is ongoing shaking. Pt is waiting for bed placement.

## 2017-09-20 NOTE — Progress Notes (Signed)
ANTICOAGULATION CONSULT NOTE - Follow Up Consult  Pharmacy Consult for Heparin Indication: Hx PE on apixaban PTA, held d/t N/V  Allergies  Allergen Reactions  . Lisinopril Nausea Only and Swelling  . Morphine And Related Itching and Swelling    Patient Measurements: Height: 5\' 7"  (170.2 cm) Weight: 196 lb (88.9 kg) IBW/kg (Calculated) : 61.6 Heparin Dosing Weight: 80.6 kg  Vital Signs: Temp: 98.7 F (37.1 C) (05/30 1301) Temp Source: Oral (05/30 1301) BP: 200/132 (05/30 1436) Pulse Rate: 129 (05/30 1400)  Labs: Recent Labs    09/19/17 1641 09/20/17 0908  HGB 13.1 12.4  HCT 37.6 36.0  PLT 482* 378  APTT  --  24  LABPROT  --  13.8  INR  --  1.07  HEPARINUNFRC  --  <0.10*  CREATININE 1.05* 0.97    Estimated Creatinine Clearance: 85.6 mL/min (by C-G formula based on SCr of 0.97 mg/dL).   Medications:  Infusions:  . sodium chloride 125 mL/hr at 09/20/17 0921  . heparin 1,300 Units/hr (09/20/17 0912)    Assessment: 29 yoF presented to the ED on 5/29 PM with intractable N/V, suspected recurrent gastroparesis.  PMH is significant for PE, CVA on chronic apixaban anticoagulation.  Pharmacy is consulted to dose Heparin IV while she is unable to tolerate PO.  PTA apixaban 5 mg PO BID.  Pt states she hasn't had any medications in the past week (not further specified). Baseline coags are WNL (HL < 0.1 and APTT 24)  Today, 09/20/2017: Heparin level 0.37 CBC: Hgb 12.4 and Plt 378 No bleeding or complications reported   Goal of Therapy:  Heparin level 0.3-0.7 units/ml Monitor platelets by anticoagulation protocol: Yes   Plan:  Continue heparin IV infusion at 1300 units/hr Heparin level in 6 hours to confirm therapeutic rate. Daily heparin level and CBC Continue to monitor H&H and platelets   Danzel Marszalek, Storm Frisk 09/20/2017,3:13 PM

## 2017-09-20 NOTE — ED Notes (Signed)
[facility] called for transport to [facility] 

## 2017-09-20 NOTE — Plan of Care (Addendum)
Patient to be admitted to [facility] as a bounceback to [facility]. Bed placement confirmed med surg bed should be available within 2 hrs. EDP to place bed request.   Called receiving unit at 2hr after transfer to [facility] order placed and bed is still not available. Discussed with EDP at [facility] that we would not be able to accept patient based on residency policy as we are not able to evaluate and treat patients while they are at [facility], and inability to eval and treat patients past 2hr poses risk to patient safety. EDP to call for admission at [facility].   Alphonzo Grieve, MD [facility] - PGY2 Pager [phone]

## 2017-09-20 NOTE — Progress Notes (Signed)
ANTICOAGULATION CONSULT NOTE - Follow Up Consult  Pharmacy Consult for Heparin Indication: Hx PE on apixaban PTA, held d/t N/V  Allergies  Allergen Reactions  . Lisinopril Nausea Only and Swelling  . Morphine And Related Itching and Swelling    Patient Measurements: Height: 5\' 7"  (170.2 cm) Weight: 196 lb (88.9 kg) IBW/kg (Calculated) : 61.6 Heparin Dosing Weight: 80.6 kg  Vital Signs: Temp: 98.5 F (36.9 C) (05/30 1951) Temp Source: Oral (05/30 1951) BP: 121/79 (05/30 1951) Pulse Rate: 96 (05/30 1951)  Labs: Recent Labs    09/19/17 1641 09/20/17 0908 09/20/17 1447 09/20/17 2146  HGB 13.1 12.4  --   --   HCT 37.6 36.0  --   --   PLT 482* 378  --   --   APTT  --  24  --   --   LABPROT  --  13.8  --   --   INR  --  1.07  --   --   HEPARINUNFRC  --  <0.10* 0.37 0.41  CREATININE 1.05* 0.97  --   --     Estimated Creatinine Clearance: 85.6 mL/min (by C-G formula based on SCr of 0.97 mg/dL).   Medications:  Infusions:  . heparin 1,300 Units/hr (09/20/17 0912)    Assessment: 54 yoF presented to the ED on 5/29 PM with intractable N/V, suspected recurrent gastroparesis.  PMH is significant for PE, CVA on chronic apixaban anticoagulation.  Pharmacy is consulted to dose Heparin IV while she is unable to tolerate PO.  PTA apixaban 5 mg PO BID.  Pt states she hasn't had any medications in the past week (not further specified). Baseline coags are WNL (HL < 0.1 and APTT 24)  Today, 09/20/2017: 2146: HL= 0.41, at goal x2  CBC: Hgb 12.4 and Plt 378 No bleeding or complications reported   Goal of Therapy:  Heparin level 0.3-0.7 units/ml Monitor platelets by anticoagulation protocol: Yes   Plan:  Continue heparin IV infusion at 1300 units/hr Heparin level in 6 hours to confirm therapeutic rate. Daily heparin level and CBC Continue to monitor H&H and platelets   Dorrene German 09/20/2017,10:43 PM

## 2017-09-20 NOTE — ED Notes (Signed)
ED TO INPATIENT HANDOFF REPORT  Name/Age/Gender Shirley [name] 42 y.o. female  Code Status Code Status History    Date Active Date Inactive Code Status Order ID Comments User Context   09/02/2017 0538 09/04/2017 2154 Full Code [id number]  Alphonzo Grieve, MD ED   08/08/2017 1502 08/13/2017 2100 Full Code [id number]  Shela Leff, MD ED   12/02/2016 1611 12/11/2016 2233 Full Code [id number]  Jule Ser, DO Inpatient   11/11/2016 2148 11/19/2016 0043 Full Code [id number]  Valinda Party, DO Inpatient   11/07/2016 0942 11/08/2016 1259 Full Code [id number]  Jule Ser, DO Inpatient   10/30/2016 1633 10/31/2016 2220 Full Code [id number]  Jule Ser, [facility]   09/09/2016 0817 09/13/2016 1740 Full Code [id number]  Rondel Jumbo, PA-C ED   08/11/2016 0303 08/14/2016 1732 Full Code [id number]  Sid Falcon, MD Inpatient   04/07/2016 0108 04/07/2016 1423 Full Code [id number]  Rise Patience, MD ED   03/17/2016 2135 03/21/2016 0057 Full Code [id number]  Norval Morton, MD ED   02/05/2016 0813 02/07/2016 1854 Full Code [id number]  Collier Salina, MD Inpatient   01/20/2016 0554 01/25/2016 2335 Full Code [id number]  Zada Finders, MD ED   11/28/2015 2330 11/29/2015 2049 Full Code [id number]  Juliet Rude, MD Inpatient   04/01/2015 1705 04/02/2015 1904 Full Code [id number]  Loleta Chance, MD Inpatient   03/27/2015 0240 03/28/2015 1923 Full Code [id number]  Norman Herrlich, MD Inpatient   11/11/2014 1642 11/15/2014 2036 Full Code [id number]  Bethena Roys, MD Inpatient   10/13/2014 0443 10/15/2014 2252 Full Code [id number]  Bethena Roys, MD ED   07/21/2014 0259 07/22/2014 1635 Full Code [id number]  Otho Bellows, MD Inpatient   04/22/2014 0130 04/26/2014 2047 Full Code [id number]  Juluis Mire, MD ED   04/03/2014 1702 04/04/2014 2059 Full Code [id number]  Francesca Oman, DO ED   07/22/2013 1243 07/25/2013 1906 Full Code [id number]  Hester Mates, MD ED   04/29/2013 2046 05/02/2013 0152 Full  Code [id number]  Theodis Blaze, MD Inpatient   04/29/2013 2039 04/29/2013 2046 Full Code [id number]  Allie Bossier, MD Inpatient   04/13/2013 0444 04/15/2013 2314 Full Code [id number]  Charlann Lange, MD Inpatient   06/20/2012 0155 06/21/2012 2002 Full Code [id number]  Annamarie Dawley, DO Inpatient   02/11/2012 0540 02/11/2012 1733 Full Code [id number]  Remer Macho, [place]   03/30/2011 1922 04/01/2011 1747 Full Code [id number]  Ronnette Hila, RN Inpatient   03/30/2011 1829 03/30/2011 1922 Full Code [id number]  Ronnette Hila, RN Inpatient      Home/SNF/Other Home  Chief Complaint N/V abd pain   Level of Care/Admitting Diagnosis ED Disposition    ED Disposition Condition Comment   Transfer to Another [place]: [facility] [[id number]] Level of Care: Med-Surg [16] Diagnosis: Nausea & vomiting [349179] Admitting Physician: Axel Filler [[id number]] Attending Physician: Axel Filler [phone] PT Class (Do Not  Modify): Observation [104] PT Acc Code (Do Not Modify): Observation [10022]       Medical History Past Medical History:  Diagnosis Date  . Abscess of tunica vaginalis    10/09- Abundant S. aureus- sensitive to all abx  . Anxiety   . Blood dyscrasia   . CAD (coronary artery disease) 06/15/2006   s/p Subendocardial MI with PDA angioplasty(no stent) on 06/15/06 and relook  cath 06/19/06 showed patency of site. Cath 12/10- no restenosis or significant CAD progression  .  CVA (cerebral vascular accident) ([place]) ~ 02/2014   denies residual on 04/22/2014  . CVA (cerebral vascular accident) [facility])    history of remote right cerebellar infarct noted on head CT at least since 10/2011  . Depression   . Diabetes mellitus type 2, uncontrolled, with complications ([place])   . Fibromyalgia   . Gastritis   . Gastroparesis    secondary to poorly controlled DM, last emptying study performed 01/2010  was normal but may be falsely  positive as pt was on reglan  . GERD (gastroesophageal reflux disease)   . Hepatitis B, chronic (HCC)    Hep BeAb+,Hep B cAb+ & Hep BsAg+ (9/06)  . History of pyelonephritis    H/o GrpB Pyelonephritis (9/06) and UTI- 07/11- E.Coli, 12/10- GBS  . Hyperlipidemia   . Hypertension   . Iron deficiency anemia   . Irregular menses    Small ovarian follicles seen on PX(1/06)  . MI (myocardial infarction) ([place]) 05/2006   PDA percutaneous transluminal coronary angioplasty  . Migraine    "weekly" (04/22/2014)  . N&V (nausea and vomiting)    Chronic. Unclear etiology with multiple admission and ED visits. CT abdomen with and without contrast (02/2011)  showed no acute process. Gastic Emptying scan (01/2010) was normal. Ultrasound of the abdomen was within normal limits. Hepatitis B viral load was undectable. HIV NR. EGD - gastritis, Hpylori + s/p Rx  . Obesity   . OSA (obstructive sleep apnea)    "suppose to wear mask but I don't" (04/22/2014)  . Peripheral neuropathy   . Pneumonia    "this is probably the 2nd or 3rd time I've had pneumonia" (04/22/2014)  . Recurrent boils   . Seasonal asthma   . Substance abuse ([place])   . Thrombocytosis (HCC)    Hem/Onc suggested 2/2 chronic hepatits and/or iron deficiency anemia    Allergies Allergies  Allergen Reactions  . Lisinopril Nausea Only and Swelling  . Morphine And Related Itching and Swelling    IV Location/Drains/Wounds Patient Lines/Drains/Airways Status   Active Line/Drains/Airways    Name:   Placement date:   Placement time:   Site:   Days:   Peripheral IV 09/19/17 Right Forearm   09/19/17    2050    Forearm   1   Incision (Closed) 12/07/16 Groin Right   12/07/16    1137     287          Labs/Imaging Results for orders placed or performed during the hospital encounter of 09/19/17 (from the past 48 hour(s))  Lipase, blood     Status: None   Collection Time: 09/19/17  4:41 PM  Result Value Ref Range   Lipase 36 11 - 51 U/L     Comment: Performed at [facility], [place] [address], [place], [place] [address]  Comprehensive metabolic panel     Status: Abnormal   Collection Time: 09/19/17  4:41 PM  Result Value Ref Range   Sodium 136 135 - 145 mmol/L   Potassium 3.8 3.5 - 5.1 mmol/L   Chloride 101 101 - 111 mmol/L   CO2 24 22 - 32 mmol/L   Glucose, Bld 343 (H) 65 - 99 mg/dL   BUN 13 6 - 20 mg/dL   Creatinine, Ser 1.05 (H) 0.44 - 1.00 mg/dL   Calcium 9.5 8.9 - 10.3 mg/dL   Total Protein 9.3 (H) 6.5 - 8.1 g/dL   Albumin 4.0 3.5 - 5.0 g/dL   AST 17 15 - 41  U/L   ALT 9 (L) 14 - 54 U/L   Alkaline Phosphatase 80 38 - 126 U/L   Total Bilirubin 0.5 0.3 - 1.2 mg/dL   GFR calc non Af Amer >60 >60 mL/min   GFR calc Af Amer >60 >60 mL/min    Comment: (NOTE) The eGFR has been calculated using the CKD EPI equation. This calculation has not been validated in all clinical situations. eGFR's persistently <60 mL/min signify possible Chronic Kidney Disease.    Anion gap 11 5 - 15    Comment: Performed at [facility], [place] [address], [place], [place] [address]  CBC     Status: Abnormal   Collection Time: 09/19/17  4:41 PM  Result Value Ref Range   WBC 15.9 (H) 4.0 - 10.5 K/uL   RBC 5.03 3.87 - 5.11 MIL/uL   Hemoglobin 13.1 12.0 - 15.0 g/dL   HCT 37.6 36.0 - 46.0 %   MCV 74.8 (L) 78.0 - 100.0 fL   MCH 26.0 26.0 - 34.0 pg   MCHC 34.8 30.0 - 36.0 g/dL   RDW 15.9 (H) 11.5 - 15.5 %   Platelets 482 (H) 150 - 400 K/uL    Comment: Performed at [facility], [place] [address], [place], [place] [address]  I-Stat beta hCG blood, ED     Status: None   Collection Time: 09/19/17  4:56 PM  Result Value Ref Range   I-stat hCG, quantitative <5.0 <5 mIU/mL   Comment 3            Comment:   GEST. AGE      CONC.  (mIU/mL)   <=1 WEEK        5 - 50     2 WEEKS       50 - 500     3 WEEKS       100 - 10,000     4 WEEKS     1,000 - 30,000        FEMALE AND NON-PREGNANT FEMALE:      LESS THAN 5 mIU/mL   Blood gas, venous     Status: Abnormal   Collection Time: 09/19/17  9:54 PM  Result Value Ref Range   pH, Ven 7.357 7.250 - 7.430   pCO2, Ven 37.1 (L) 44.0 - 60.0 mmHg   pO2, Ven 31.8 (LL) 32.0 - 45.0 mmHg    Comment: CRITICAL RESULT CALLED TO, READ BACK BY AND VERIFIED WITH: L. ADKINS, RN AT 2158 BY T.KNAPP RRT, RCP ON 09/19/17    Bicarbonate 20.3 20.0 - 28.0 mmol/L   Acid-base deficit 4.1 (H) 0.0 - 2.0 mmol/L   O2 Saturation 51.6 %   Patient temperature 98.6    Collection site VEIN    Drawn by [phone]    Sample type VEIN     Comment: Performed at [facility], [place] [address], [place],  [address]   No results found.  Pending Labs Unresulted Labs (From admission, onward)   Start     Ordered   09/19/17 2007  Rapid urine drug screen (hospital performed)  STAT,   R     09/19/17 2006   09/19/17 1618  Urinalysis, Routine w reflex microscopic  STAT,   STAT     09/19/17 1617      Vitals/Pain Today's Vitals   09/19/17 1901 09/19/17 2001 09/19/17 2200 09/20/17 0215  BP: 105/67  (!) 187/99 (!) 183/113  Pulse: (!) 120  (!) 109 Marland Kitchen)  111  Resp: 18  18 16   Temp:      TempSrc:      SpO2: 97%  98% 97%  Weight:      Height:      PainSc:  10-Worst pain ever      Isolation Precautions No active isolations  Medications Medications  ondansetron (ZOFRAN) injection 4 mg (4 mg Intravenous Given 09/20/17 0210)  ondansetron (ZOFRAN-ODT) disintegrating tablet 4 mg (4 mg Oral Given 09/19/17 1620)  sodium chloride 0.9 % bolus 1,000 mL (0 mLs Intravenous Stopped 09/19/17 2200)  haloperidol lactate (HALDOL) injection 2 mg (2 mg Intravenous Given 09/19/17 2051)  metoCLOPramide (REGLAN) injection 10 mg (10 mg Intravenous Given 09/19/17 2331)  sodium chloride 0.9 % bolus 1,000 mL (0 mLs Intravenous Stopped 09/20/17 0152)    Mobility walks

## 2017-09-20 NOTE — ED Provider Notes (Signed)
Patient seen and treated by previous treatment team with plan to admit to [facility] as she is an Internal Medicine patient. Consultation was made to their service at which time she was accepted pending bed availability.   At 3:30 I received a call from Internal Medicine admitting resident who advised bed was still not available and, per policy, the patient will need to be admitted here.   Recheck of the patient: she is still nauseous, unable to tolerate PO's. Additional medication provided. She has had Zofran x 2, Reglan and Haldol. Will try phenergan for better relief.   Hospitalist paged for admission for gastroparesis per plan of previous treatment team.    Charlann Lange, PA-C 09/21/17 0604    Carmin Muskrat, MD 09/22/17 1531

## 2017-09-20 NOTE — ED Notes (Signed)
Pt needs second IV access,  RN attempted IV team consulted.

## 2017-09-20 NOTE — ED Notes (Signed)
Contacted Dr. Quincy Simmonds Regarding floor concerns of HTN and tachycardia. Dr. Quincy Simmonds stated that he had reviewed Pt current v/s and had seen the patient early this am and confirmed that pt to continue to be admitted to [facility]. Bed.,

## 2017-09-20 NOTE — ED Notes (Signed)
Report given. Room still not ready. RN will call Charge when room becomes available.

## 2017-09-20 NOTE — ED Notes (Signed)
ED TO INPATIENT HANDOFF REPORT  Name/Age/Gender Shirley [name] 42 y.o. female  Code Status    Code Status Orders  (From admission, onward)        Start     Ordered   09/20/17 0617  Full code  Continuous     09/20/17 0618    Code Status History    Date Active Date Inactive Code Status Order ID Comments User Context   09/02/2017 0538 09/04/2017 2154 Full Code [id number]  Alphonzo Grieve, MD ED   08/08/2017 1502 08/13/2017 2100 Full Code [id number]  Shela Leff, MD ED   12/02/2016 1611 12/11/2016 2233 Full Code [id number]  Jule Ser, DO Inpatient   11/11/2016 2148 11/19/2016 0043 Full Code [id number]  Valinda Party, DO Inpatient   11/07/2016 0942 11/08/2016 1259 Full Code [id number]  Jule Ser, DO Inpatient   10/30/2016 1633 10/31/2016 2220 Full Code [id number]  Jule Ser, [place] [facility]   09/09/2016 0817 09/13/2016 1740 Full Code [id number]  Rondel Jumbo, PA-C ED   08/11/2016 0303 08/14/2016 1732 Full Code [id number]  Sid Falcon, MD Inpatient   04/07/2016 0108 04/07/2016 1423 Full Code [id number]  Rise Patience, MD ED   03/17/2016 2135 03/21/2016 0057 Full Code [id number]  Norval Morton, MD ED   02/05/2016 0813 02/07/2016 1854 Full Code [id number]  Collier Salina, MD Inpatient   01/20/2016 0554 01/25/2016 2335 Full Code [id number]  Zada Finders, MD ED   11/28/2015 2330 11/29/2015 2049 Full Code [id number]  Juliet Rude, MD Inpatient   04/01/2015 1705 04/02/2015 1904 Full Code [id number]  Loleta Chance, MD Inpatient   03/27/2015 0240 03/28/2015 1923 Full Code [id number]  Norman Herrlich, MD Inpatient   11/11/2014 1642 11/15/2014 2036 Full Code [id number]  Bethena Roys, MD Inpatient   10/13/2014 0443 10/15/2014 2252 Full Code [id number]  Bethena Roys, MD ED   07/21/2014 0259 07/22/2014 1635 Full Code [id number]  Otho Bellows, MD Inpatient   04/22/2014 0130 04/26/2014 2047 Full Code [id number]  Juluis Mire, MD ED   04/03/2014 1702 04/04/2014 2059 Full Code  [id number]  Francesca Oman, DO ED   07/22/2013 1243 07/25/2013 1906 Full Code [id number]  Hester Mates, MD ED   04/29/2013 2046 05/02/2013 0152 Full Code [id number]  Theodis Blaze, MD Inpatient   04/29/2013 2039 04/29/2013 2046 Full Code [id number]  Allie Bossier, MD Inpatient   04/13/2013 0444 04/15/2013 2314 Full Code [id number]  Charlann Lange, MD Inpatient   06/20/2012 0155 06/21/2012 2002 Full Code [id number]  Annamarie Dawley, DO Inpatient   02/11/2012 0540 02/11/2012 1733 Full Code [id number]  Remer Macho, [place] [facility]   03/30/2011 1922 04/01/2011 1747 Full Code [id number]  Ronnette Hila, RN Inpatient   03/30/2011 1829 03/30/2011 1922 Full Code [id number]  Ronnette Hila, RN Inpatient      Home/SNF/Other Home  Chief Complaint N/V abd pain   Level of Care/Admitting Diagnosis ED Disposition    ED Disposition Condition [facility] Area: [place] [080223]  Level of Care: Telemetry [5]  Admit to tele based on following criteria: Monitor QTC interval  Diagnosis: Nausea & vomiting [361224]  Admitting Physician: Rise Patience [phone]  Attending Physician: Rise Patience [[id number]]  PT Class (Do Not Modify): Observation [104]  PT Acc Code (Do Not Modify): Observation [10022]       Medical History Past Medical History:  Diagnosis Date  . Abscess of tunica vaginalis  10/09- Abundant S. aureus- sensitive to all abx  . Anxiety   . Blood dyscrasia   . CAD (coronary artery disease) 06/15/2006   s/p Subendocardial MI with PDA angioplasty(no stent) on 06/15/06 and relook  cath 06/19/06 showed patency of site. Cath 12/10- no restenosis or significant CAD progression  . CVA (cerebral vascular accident) ([place]) ~ 02/2014   denies residual on 04/22/2014  . CVA (cerebral vascular accident) [facility])    history of remote right cerebellar infarct noted on head CT at least since 10/2011  . Depression   . Diabetes mellitus type 2, uncontrolled, with  complications ([place])   . Fibromyalgia   . Gastritis   . Gastroparesis    secondary to poorly controlled DM, last emptying study performed 01/2010  was normal but may be falsely positive as pt was on reglan  . GERD (gastroesophageal reflux disease)   . Hepatitis B, chronic (HCC)    Hep BeAb+,Hep B cAb+ & Hep BsAg+ (9/06)  . History of pyelonephritis    H/o GrpB Pyelonephritis (9/06) and UTI- 07/11- E.Coli, 12/10- GBS  . Hyperlipidemia   . Hypertension   . Iron deficiency anemia   . Irregular menses    Small ovarian follicles seen on PY(1/95)  . MI (myocardial infarction) ([place]) 05/2006   PDA percutaneous transluminal coronary angioplasty  . Migraine    "weekly" (04/22/2014)  . N&V (nausea and vomiting)    Chronic. Unclear etiology with multiple admission and ED visits. CT abdomen with and without contrast (02/2011)  showed no acute process. Gastic Emptying scan (01/2010) was normal. Ultrasound of the abdomen was within normal limits. Hepatitis B viral load was undectable. HIV NR. EGD - gastritis, Hpylori + s/p Rx  . Obesity   . OSA (obstructive sleep apnea)    "suppose to wear mask but I don't" (04/22/2014)  . Peripheral neuropathy   . Pneumonia    "this is probably the 2nd or 3rd time I've had pneumonia" (04/22/2014)  . Recurrent boils   . Seasonal asthma   . Substance abuse ([place])   . Thrombocytosis (HCC)    Hem/Onc suggested 2/2 chronic hepatits and/or iron deficiency anemia    Allergies Allergies  Allergen Reactions  . Lisinopril Nausea Only and Swelling  . Morphine And Related Itching and Swelling    IV Location/Drains/Wounds Patient Lines/Drains/Airways Status   Active Line/Drains/Airways    Name:   Placement date:   Placement time:   Site:   Days:   Peripheral IV 09/19/17 Right Forearm   09/19/17    2050    Forearm   1   Incision (Closed) 12/07/16 Groin Right   12/07/16    1137     287          Labs/Imaging Results for orders placed or performed during the  hospital encounter of 09/19/17 (from the past 48 hour(s))  Lipase, blood     Status: None   Collection Time: 09/19/17  4:41 PM  Result Value Ref Range   Lipase 36 11 - 51 U/L    Comment: Performed at [facility], [place] [address] [place], [place] [address]  Comprehensive metabolic panel     Status: Abnormal   Collection Time: 09/19/17  4:41 PM  Result Value Ref Range   Sodium 136 135 - 145 mmol/L   Potassium 3.8 3.5 - 5.1 mmol/L   Chloride 101 101 - 111 mmol/L   CO2 24 22 - 32 mmol/L   Glucose, Bld 343 (H) 65 -  99 mg/dL   BUN 13 6 - 20 mg/dL   Creatinine, Ser 1.05 (H) 0.44 - 1.00 mg/dL   Calcium 9.5 8.9 - 10.3 mg/dL   Total Protein 9.3 (H) 6.5 - 8.1 g/dL   Albumin 4.0 3.5 - 5.0 g/dL   AST 17 15 - 41 U/L   ALT 9 (L) 14 - 54 U/L   Alkaline Phosphatase 80 38 - 126 U/L   Total Bilirubin 0.5 0.3 - 1.2 mg/dL   GFR calc non Af Amer >60 >60 mL/min   GFR calc Af Amer >60 >60 mL/min    Comment: (NOTE) The eGFR has been calculated using the CKD EPI equation. This calculation has not been validated in all clinical situations. eGFR's persistently <60 mL/min signify possible Chronic Kidney Disease.    Anion gap 11 5 - 15    Comment: Performed at [facility], [place] [address], [place], [place] [address]  CBC     Status: Abnormal   Collection Time: 09/19/17  4:41 PM  Result Value Ref Range   WBC 15.9 (H) 4.0 - 10.5 K/uL   RBC 5.03 3.87 - 5.11 MIL/uL   Hemoglobin 13.1 12.0 - 15.0 g/dL   HCT 37.6 36.0 - 46.0 %   MCV 74.8 (L) 78.0 - 100.0 fL   MCH 26.0 26.0 - 34.0 pg   MCHC 34.8 30.0 - 36.0 g/dL   RDW 15.9 (H) 11.5 - 15.5 %   Platelets 482 (H) 150 - 400 K/uL    Comment: Performed at [facility], [place] [address], [place], [place] [address]  I-Stat beta hCG blood, ED     Status: None   Collection Time: 09/19/17  4:56 PM  Result Value Ref Range   I-stat hCG, quantitative <5.0 <5 mIU/mL   Comment 3            Comment:   GEST. AGE       CONC.  (mIU/mL)   <=1 WEEK        5 - 50     2 WEEKS       50 - 500     3 WEEKS       100 - 10,000     4 WEEKS     1,000 - 30,000        FEMALE AND NON-PREGNANT FEMALE:     LESS THAN 5 mIU/mL   Blood gas, venous     Status: Abnormal   Collection Time: 09/19/17  9:54 PM  Result Value Ref Range   pH, Ven 7.357 7.250 - 7.430   pCO2, Ven 37.1 (L) 44.0 - 60.0 mmHg   pO2, Ven 31.8 (LL) 32.0 - 45.0 mmHg    Comment: CRITICAL RESULT CALLED TO, READ BACK BY AND VERIFIED WITH: L. ADKINS, RN AT 2158 BY T.KNAPP RRT, RCP ON 09/19/17    Bicarbonate 20.3 20.0 - 28.0 mmol/L   Acid-base deficit 4.1 (H) 0.0 - 2.0 mmol/L   O2 Saturation 51.6 %   Patient temperature 98.6    Collection site VEIN    Drawn by [phone]    Sample type VEIN     Comment: Performed at [facility], [place] [address] [place], [place] [address]  CBG monitoring, ED     Status: Abnormal   Collection Time: 09/20/17  8:43 AM  Result Value Ref Range   Glucose-Capillary 216 (H) 65 - 99 mg/dL  Urinalysis, Routine w reflex microscopic     Status: Abnormal  Collection Time: 09/20/17  9:08 AM  Result Value Ref Range   Color, Urine YELLOW YELLOW   APPearance HAZY (A) CLEAR   Specific Gravity, Urine 1.014 1.005 - 1.030   pH 7.0 5.0 - 8.0   Glucose, UA >=500 (A) NEGATIVE mg/dL   Hgb urine dipstick NEGATIVE NEGATIVE   Bilirubin Urine NEGATIVE NEGATIVE   Ketones, ur 5 (A) NEGATIVE mg/dL   Protein, ur 100 (A) NEGATIVE mg/dL   Nitrite NEGATIVE NEGATIVE   Leukocytes, UA NEGATIVE NEGATIVE   RBC / HPF 0-5 0 - 5 RBC/hpf   WBC, UA 6-10 0 - 5 WBC/hpf   Bacteria, UA RARE (A) NONE SEEN   Squamous Epithelial / LPF 21-50 0 - 5   Mucus PRESENT     Comment: Performed at [facility], [place] [address], [place], [place] [address]  Rapid urine drug screen (hospital performed)     Status: Abnormal   Collection Time: 09/20/17  9:08 AM  Result Value Ref Range   Opiates NONE DETECTED NONE DETECTED   Cocaine POSITIVE  (A) NONE DETECTED   Benzodiazepines NONE DETECTED NONE DETECTED   Amphetamines NONE DETECTED NONE DETECTED   Tetrahydrocannabinol NONE DETECTED NONE DETECTED   Barbiturates NONE DETECTED NONE DETECTED    Comment: (NOTE) DRUG SCREEN FOR MEDICAL PURPOSES ONLY.  IF CONFIRMATION IS NEEDED FOR ANY PURPOSE, NOTIFY LAB WITHIN 5 DAYS. LOWEST DETECTABLE LIMITS FOR URINE DRUG SCREEN Drug Class                     Cutoff (ng/mL) Amphetamine and metabolites    1000 Barbiturate and metabolites    200 Benzodiazepine                 062 Tricyclics and metabolites     300 Opiates and metabolites        300 Cocaine and metabolites        300 THC                            50 Performed at [facility], [place] [address] [place], [place] [address]   Hepatic function panel     Status: Abnormal   Collection Time: 09/20/17  9:08 AM  Result Value Ref Range   Total Protein 7.8 6.5 - 8.1 g/dL   Albumin 3.3 (L) 3.5 - 5.0 g/dL   AST 22 15 - 41 U/L   ALT 10 (L) 14 - 54 U/L   Alkaline Phosphatase 70 38 - 126 U/L   Total Bilirubin 0.6 0.3 - 1.2 mg/dL   Bilirubin, Direct 0.1 0.1 - 0.5 mg/dL   Indirect Bilirubin 0.5 0.3 - 0.9 mg/dL    Comment: Performed at [facility], [place] [address], [place], [place] [address]  Basic metabolic panel     Status: Abnormal   Collection Time: 09/20/17  9:08 AM  Result Value Ref Range   Sodium 137 135 - 145 mmol/L   Potassium 4.1 3.5 - 5.1 mmol/L   Chloride 103 101 - 111 mmol/L   CO2 23 22 - 32 mmol/L   Glucose, Bld 238 (H) 65 - 99 mg/dL   BUN 11 6 - 20 mg/dL   Creatinine, Ser 0.97 0.44 - 1.00 mg/dL   Calcium 8.9 8.9 - 10.3 mg/dL   GFR calc non Af Amer >60 >60 mL/min   GFR calc Af Amer >60 >60 mL/min    Comment: (NOTE)  The eGFR has been calculated using the CKD EPI equation. This calculation has not been validated in all clinical situations. eGFR's persistently <60 mL/min signify possible Chronic Kidney Disease.    Anion gap 11  5 - 15    Comment: Performed at [facility], [place] [address], [place], [place] [address]  CBC WITH DIFFERENTIAL     Status: Abnormal   Collection Time: 09/20/17  9:08 AM  Result Value Ref Range   WBC 14.8 (H) 4.0 - 10.5 K/uL   RBC 4.71 3.87 - 5.11 MIL/uL   Hemoglobin 12.4 12.0 - 15.0 g/dL   HCT 36.0 36.0 - 46.0 %   MCV 76.4 (L) 78.0 - 100.0 fL   MCH 26.3 26.0 - 34.0 pg   MCHC 34.4 30.0 - 36.0 g/dL   RDW 15.9 (H) 11.5 - 15.5 %   Platelets 378 150 - 400 K/uL   Neutrophils Relative % 87 %   Neutro Abs 12.8 (H) 1.7 - 7.7 K/uL   Lymphocytes Relative 10 %   Lymphs Abs 1.5 0.7 - 4.0 K/uL   Monocytes Relative 3 %   Monocytes Absolute 0.5 0.1 - 1.0 K/uL   Eosinophils Relative 0 %   Eosinophils Absolute 0.0 0.0 - 0.7 K/uL   Basophils Relative 0 %   Basophils Absolute 0.0 0.0 - 0.1 K/uL    Comment: Performed at [facility], [place] [address] [place], [place] [address]  APTT     Status: None   Collection Time: 09/20/17  9:08 AM  Result Value Ref Range   aPTT 24 24 - 36 seconds    Comment: Performed at [facility], [place] [address] [place], [place] [address]  Heparin level (unfractionated)     Status: Abnormal   Collection Time: 09/20/17  9:08 AM  Result Value Ref Range   Heparin Unfractionated <0.10 (L) 0.30 - 0.70 IU/mL    Comment: (NOTE) If heparin results are below expected values, and patient dosage has  been confirmed, suggest follow up testing of antithrombin III levels. Performed at [facility], [place] [address] [place], [place] [address]   Protime-INR     Status: None   Collection Time: 09/20/17  9:08 AM  Result Value Ref Range   Prothrombin Time 13.8 11.4 - 15.2 seconds   INR 1.07     Comment: Performed at [facility], [place] [address] [place], [place] [address]  CBG monitoring, ED     Status: Abnormal   Collection Time: 09/20/17 12:07 PM  Result Value Ref Range    Glucose-Capillary 202 (H) 65 - 99 mg/dL   Comment 1 Notify RN    Comment 2 Document in Chart   Heparin level (unfractionated)     Status: None   Collection Time: 09/20/17  2:47 PM  Result Value Ref Range   Heparin Unfractionated 0.37 0.30 - 0.70 IU/mL    Comment: (NOTE) If heparin results are below expected values, and patient dosage has  been confirmed, suggest follow up testing of antithrombin III levels. Performed at [facility], [place] [address], [place], [place] [address]    No results found.  Pending Labs Unresulted Labs (From admission, onward)   Start     Ordered   09/21/17 0500  CBC  Daily,   R     09/20/17 0715   09/21/17 0500  Heparin level (unfractionated)  Daily,   R     09/20/17 1537   09/20/17  2200  Heparin level (unfractionated)  Once-Timed,   R     09/20/17 1536      Vitals/Pain Today's Vitals   09/20/17 1301 09/20/17 1400 09/20/17 1436 09/20/17 1526  BP: (!) 156/131  (!) 200/132 (!) 204/176  Pulse:  (!) 129  (!) 125  Resp:  15  20  Temp: 98.7 F (37.1 C)     TempSrc: Oral     SpO2:  98%  100%  Weight:      Height:      PainSc:        Isolation Precautions No active isolations  Medications Medications  feeding supplement (GLUCERNA SHAKE) (GLUCERNA SHAKE) liquid 237 mL (has no administration in time range)  insulin NPH Human (HUMULIN N,NOVOLIN N) injection 12 Units (12 Units Subcutaneous Not Given 09/20/17 1005)  metoprolol tartrate (LOPRESSOR) tablet 100 mg (has no administration in time range)  pantoprazole (PROTONIX) EC tablet 40 mg (has no administration in time range)  promethazine (PHENERGAN) tablet 25 mg (has no administration in time range)  acetaminophen (TYLENOL) tablet 650 mg (650 mg Oral Given 09/20/17 1255)    Or  acetaminophen (TYLENOL) suppository 650 mg ( Rectal See Alternative 09/20/17 1255)  ondansetron (ZOFRAN) tablet 4 mg ( Oral See Alternative 09/20/17 1255)    Or  ondansetron (ZOFRAN) injection 4 mg (4 mg  Intravenous Given 09/20/17 1255)  insulin aspart (novoLOG) injection 0-9 Units (3 Units Subcutaneous Given 09/20/17 1420)  0.9 %  sodium chloride infusion ( Intravenous New Bag/Given 09/20/17 0921)  labetalol (NORMODYNE,TRANDATE) injection 10 mg (has no administration in time range)  metoCLOPramide (REGLAN) injection 5 mg (has no administration in time range)  heparin ADULT infusion 100 units/mL (25000 units/233m sodium chloride 0.45%) (1,300 Units/hr Intravenous New Bag/Given 09/20/17 0912)  hydrALAZINE (APRESOLINE) injection 10 mg (10 mg Intravenous Given 09/20/17 1436)  albuterol (PROVENTIL) (2.5 MG/3ML) 0.083% nebulizer solution 2.5 mg (has no administration in time range)  ondansetron (ZOFRAN-ODT) disintegrating tablet 4 mg (4 mg Oral Given 09/19/17 1620)  sodium chloride 0.9 % bolus 1,000 mL (0 mLs Intravenous Stopped 09/19/17 2200)  haloperidol lactate (HALDOL) injection 2 mg (2 mg Intravenous Given 09/19/17 2051)  metoCLOPramide (REGLAN) injection 10 mg (10 mg Intravenous Given 09/19/17 2331)  sodium chloride 0.9 % bolus 1,000 mL (0 mLs Intravenous Stopped 09/20/17 0152)  promethazine (PHENERGAN) injection 12.5 mg (12.5 mg Intravenous Given 09/20/17 0353)    Mobility walks

## 2017-09-20 NOTE — Progress Notes (Signed)
ANTICOAGULATION CONSULT NOTE - Initial Consult  Pharmacy Consult for IV heparin Indication: hx of PE on apixaban   Allergies  Allergen Reactions  . Lisinopril Nausea Only and Swelling  . Morphine And Related Itching and Swelling    Patient Measurements: Height: 5\' 7"  (170.2 cm) Weight: 196 lb (88.9 kg) IBW/kg (Calculated) : 61.6 Heparin Dosing Weight: 70 kg  Vital Signs: BP: 183/113 (05/30 0215) Pulse Rate: 111 (05/30 0215)  Labs: Recent Labs    09/19/17 1641  HGB 13.1  HCT 37.6  PLT 482*  CREATININE 1.05*    Estimated Creatinine Clearance: 79.1 mL/min (A) (by C-G formula based on SCr of 1.05 mg/dL (H)).   Medical History: Past Medical History:  Diagnosis Date  . Abscess of tunica vaginalis    10/09- Abundant S. aureus- sensitive to all abx  . Anxiety   . Blood dyscrasia   . CAD (coronary artery disease) 06/15/2006   s/p Subendocardial MI with PDA angioplasty(no stent) on 06/15/06 and relook  cath 06/19/06 showed patency of site. Cath 12/10- no restenosis or significant CAD progression  . CVA (cerebral vascular accident) ([place]) ~ 02/2014   denies residual on 04/22/2014  . CVA (cerebral vascular accident) [facility])    history of remote right cerebellar infarct noted on head CT at least since 10/2011  . Depression   . Diabetes mellitus type 2, uncontrolled, with complications ([place])   . Fibromyalgia   . Gastritis   . Gastroparesis    secondary to poorly controlled DM, last emptying study performed 01/2010  was normal but may be falsely positive as pt was on reglan  . GERD (gastroesophageal reflux disease)   . Hepatitis B, chronic (HCC)    Hep BeAb+,Hep B cAb+ & Hep BsAg+ (9/06)  . History of pyelonephritis    H/o GrpB Pyelonephritis (9/06) and UTI- 07/11- E.Coli, 12/10- GBS  . Hyperlipidemia   . Hypertension   . Iron deficiency anemia   . Irregular menses    Small ovarian follicles seen on SE(8/31)  . MI (myocardial infarction) ([place]) 05/2006   PDA percutaneous  transluminal coronary angioplasty  . Migraine    "weekly" (04/22/2014)  . N&V (nausea and vomiting)    Chronic. Unclear etiology with multiple admission and ED visits. CT abdomen with and without contrast (02/2011)  showed no acute process. Gastic Emptying scan (01/2010) was normal. Ultrasound of the abdomen was within normal limits. Hepatitis B viral load was undectable. HIV NR. EGD - gastritis, Hpylori + s/p Rx  . Obesity   . OSA (obstructive sleep apnea)    "suppose to wear mask but I don't" (04/22/2014)  . Peripheral neuropathy   . Pneumonia    "this is probably the 2nd or 3rd time I've had pneumonia" (04/22/2014)  . Recurrent boils   . Seasonal asthma   . Substance abuse ([place])   . Thrombocytosis (HCC)    Hem/Onc suggested 2/2 chronic hepatits and/or iron deficiency anemia    Medications:  Scheduled:  . feeding supplement (GLUCERNA SHAKE)  237 mL Oral TID BM  . insulin aspart  0-9 Units Subcutaneous TID WC  . insulin NPH Human  12 Units Subcutaneous BID AC  . metoCLOPramide (REGLAN) injection  5 mg Intravenous Q6H  . metoprolol tartrate  100 mg Oral BID  . pantoprazole  40 mg Oral Daily   Infusions:  . sodium chloride      Assessment: 70 yoF on chronic apixaban for hx of PE now with nausea and vomiting unable to keep  anything down for 3 days. IV heparin for PE.  Baseline H/H, Plts WNL  Goal of Therapy:  APtt=66-102 sec Heparin level 0.3-0.7 units/ml Monitor platelets by anticoagulation protocol: Yes   Plan:  Baseline aPtt, PT/INR and HL STAT Heparin 1300 units/hr (13 ml/hr) Daily CBC/HL Check 1st HL in 6 hours  Dorrene German 09/20/2017,6:49 AM

## 2017-09-20 NOTE — Progress Notes (Signed)
[facility]  Patient admitted after midnight by my partner. See H&P for further details.  Chart reviewed, patient seen and examined.   Patient continues with nausea, no further emesis. She is sedated, but complain of abdominal pain. Will continue current treatment. Her BP is elevated, will d/c IVF, continue full liquid diet. Add hydralazine PRN, continue labetalol PRN. Rest per H&P.   [name], MD

## 2017-09-20 NOTE — Progress Notes (Signed)
Inpatient Diabetes Program Recommendations  AACE/ADA: New Consensus Statement on Inpatient Glycemic Control (2015)  Target Ranges:  Prepandial:   less than 140 mg/dL      Peak postprandial:   less than 180 mg/dL (1-2 hours)      Critically ill patients:  140 - 180 mg/dL   Lab Results  Component Value Date   GLUCAP 202 (H) 09/20/2017   HGBA1C 10.2 (H) 08/08/2017    Review of Glycemic Control  Diabetes history: DM2 Outpatient Diabetes medications: NPH 12 units bid Current orders for Inpatient glycemic control: Novolog 0-9 units tidwc, NPH 12 units bid  Did not receive NPH am dose of 12 units this am. Pt has not taken insulin since last admission d/t not having syringes. Has been seen by Diabetes Coordinators on multiple occasions. HgbA1C - 10.2% - poor glycemic control at home.  Inpatient Diabetes Program Recommendations:    Change Novolog to 0-15 units Q4H until po intake increases.  Change NPH to bid (1000 and 2200) instead of 1700.  Follow.  Thank you. Lorenda Peck, RD, LDN, CDE Inpatient Diabetes Coordinator [phone]

## 2017-09-21 ENCOUNTER — Observation Stay (HOSPITAL_COMMUNITY): Payer: Self-pay

## 2017-09-21 LAB — GLUCOSE, CAPILLARY
GLUCOSE-CAPILLARY: 80 mg/dL (ref 65–99)
GLUCOSE-CAPILLARY: 93 mg/dL (ref 65–99)
Glucose-Capillary: 201 mg/dL — ABNORMAL HIGH (ref 65–99)
Glucose-Capillary: 214 mg/dL — ABNORMAL HIGH (ref 65–99)

## 2017-09-21 LAB — CBC
HCT: 35.6 % — ABNORMAL LOW (ref 36.0–46.0)
Hemoglobin: 12.1 g/dL (ref 12.0–15.0)
MCH: 26 pg (ref 26.0–34.0)
MCHC: 34 g/dL (ref 30.0–36.0)
MCV: 76.4 fL — ABNORMAL LOW (ref 78.0–100.0)
PLATELETS: 414 10*3/uL — AB (ref 150–400)
RBC: 4.66 MIL/uL (ref 3.87–5.11)
RDW: 16.2 % — ABNORMAL HIGH (ref 11.5–15.5)
WBC: 14.6 10*3/uL — ABNORMAL HIGH (ref 4.0–10.5)

## 2017-09-21 LAB — BASIC METABOLIC PANEL
Anion gap: 6 (ref 5–15)
BUN: 20 mg/dL (ref 6–20)
CALCIUM: 9 mg/dL (ref 8.9–10.3)
CO2: 25 mmol/L (ref 22–32)
Chloride: 104 mmol/L (ref 101–111)
Creatinine, Ser: 1.39 mg/dL — ABNORMAL HIGH (ref 0.44–1.00)
GFR calc Af Amer: 53 mL/min — ABNORMAL LOW (ref >60)
GFR, EST NON AFRICAN AMERICAN: 46 mL/min — AB (ref >60)
Glucose, Bld: 199 mg/dL — ABNORMAL HIGH (ref 65–99)
Potassium: 4 mmol/L (ref 3.5–5.1)
Sodium: 135 mmol/L (ref 135–145)

## 2017-09-21 LAB — HEPARIN LEVEL (UNFRACTIONATED): HEPARIN UNFRACTIONATED: 0.43 [IU]/mL (ref 0.30–0.70)

## 2017-09-21 MED ORDER — SODIUM CHLORIDE 0.45 % IV SOLN
INTRAVENOUS | Status: DC
  Administered 2017-09-21 – 2017-09-22 (×2): via INTRAVENOUS

## 2017-09-21 MED ORDER — PANTOPRAZOLE SODIUM 40 MG PO TBEC
40.0000 mg | DELAYED_RELEASE_TABLET | Freq: Two times a day (BID) | ORAL | Status: DC
  Administered 2017-09-21 – 2017-09-23 (×4): 40 mg via ORAL
  Filled 2017-09-21 (×4): qty 1

## 2017-09-21 MED ORDER — FENTANYL CITRATE (PF) 100 MCG/2ML IJ SOLN
12.5000 ug | INTRAMUSCULAR | Status: DC | PRN
  Administered 2017-09-21: 12.5 ug via INTRAVENOUS
  Filled 2017-09-21: qty 2

## 2017-09-21 MED ORDER — IOHEXOL 300 MG/ML  SOLN
100.0000 mL | Freq: Once | INTRAMUSCULAR | Status: AC | PRN
  Administered 2017-09-21: 100 mL via INTRAVENOUS

## 2017-09-21 MED ORDER — METOCLOPRAMIDE HCL 10 MG PO TABS
10.0000 mg | ORAL_TABLET | Freq: Three times a day (TID) | ORAL | Status: DC
  Administered 2017-09-21 – 2017-09-23 (×6): 10 mg via ORAL
  Filled 2017-09-21 (×6): qty 1

## 2017-09-21 MED ORDER — LABETALOL HCL 5 MG/ML IV SOLN
10.0000 mg | INTRAVENOUS | Status: DC | PRN
  Filled 2017-09-21: qty 4

## 2017-09-21 MED ORDER — INSULIN ASPART 100 UNIT/ML ~~LOC~~ SOLN
0.0000 [IU] | Freq: Three times a day (TID) | SUBCUTANEOUS | Status: DC
  Administered 2017-09-22: 15 [IU] via SUBCUTANEOUS
  Administered 2017-09-22: 11 [IU] via SUBCUTANEOUS
  Administered 2017-09-22: 2 [IU] via SUBCUTANEOUS
  Administered 2017-09-23: 3 [IU] via SUBCUTANEOUS
  Administered 2017-09-23: 8 [IU] via SUBCUTANEOUS

## 2017-09-21 MED ORDER — AMLODIPINE BESYLATE 10 MG PO TABS
10.0000 mg | ORAL_TABLET | Freq: Every day | ORAL | Status: DC
  Administered 2017-09-21 – 2017-09-23 (×3): 10 mg via ORAL
  Filled 2017-09-21 (×3): qty 1

## 2017-09-21 MED ORDER — TRAMADOL HCL 50 MG PO TABS
50.0000 mg | ORAL_TABLET | Freq: Two times a day (BID) | ORAL | Status: DC | PRN
  Administered 2017-09-21 – 2017-09-23 (×3): 50 mg via ORAL
  Filled 2017-09-21 (×3): qty 1

## 2017-09-21 MED ORDER — INSULIN ASPART 100 UNIT/ML ~~LOC~~ SOLN
0.0000 [IU] | Freq: Every day | SUBCUTANEOUS | Status: DC
  Administered 2017-09-21: 2 [IU] via SUBCUTANEOUS

## 2017-09-21 MED ORDER — HYDRALAZINE HCL 20 MG/ML IJ SOLN: 10.0000 mg | INTRAMUSCULAR | Status: DC | PRN

## 2017-09-21 NOTE — Progress Notes (Signed)
PROGRESS NOTE [facility]   Pearle Wandler [place]   PJA:[id number] DOB: 24-Nov-1975  DOA: 09/19/2017 PCP: Clent Demark, PA-C   Brief Narrative:  Shirley [name] 42 year old female with medical history of diabetes mellitus type 2, hypertension, history of PE and CVA who presented to the emergency department complaining of persistent nausea and vomiting.  Associated symptoms include abdominal pain.  Upon ED evaluation she was found to be severe hypertensive, elevated blood glucose and UDS positive for cocaine.  Patient was admitted with working diagnosis of intractable nausea and vomiting.  Subjective: Patient seen and examined, she is c/o abdominal pain and nausea, no further emesis. No acute events overnight. Afebrile. Having regular BM's. Had full liquids but became very nauseous.   Assessment & Plan: Intractable nausea and vomiting with abdominal pain Differential diagnosis includes gastroparesis, cocaine induced gastritis, hypertensive crisis or functional disorder.  CT abdomen and pelvis negative.  Will start on Reglan 10 mg 3 times daily, continue supportive treatment with Zofran.  Discontinue Phenergan.  Will keep patient n.p.o. for 24 hours.  Will order gastric emptying study. Tight glucose control.  Protonix 40 mg twice daily.  Pain control as needed.  Hypertensive crisis BP improving, currently on metoprolol, amlodipine was added.  Hydrochlorothiazide on hold due to AKI. Continue hydralazine  and labetalol as needed.  This could be contributing to her nausea is vomiting as well.  AKI Prerenal Continue gentle hydration Check renal function in a.m.  Diabetes mellitus type 2 uncontrolled with hyperglycemia Last A1c April 2019 was 10.2.  Patient not compliant with medication Continue NPH 12 units twice daily Moderate SSI Follow-up CBGs closely  History of PE on chronic anticoagulation Patient currently on heparin drip, hopefully can transition to home dose Eliquis in  a.m.  DVT prophylaxis: Heparin drip Code Status: Full code Family Communication: None at bedside Disposition Plan: Home when able to tolerate p.o.  Consultants:   None  Procedures:   None  Antimicrobials:  None   Objective: Vitals:   09/20/17 1951 09/21/17 0555 09/21/17 1103 09/21/17 1500  BP: 121/79 (!) 156/118 (!) 172/138 (!) 131/92  Pulse: 96 (!) 101 (!) 109 93  Resp: 18 20 18 16   Temp: 98.5 F (36.9 C) 98.9 F (37.2 C)  98.1 F (36.7 C)  TempSrc: Oral Oral  Oral  SpO2: 98% 97% 100% 100%  Weight:      Height:        Intake/Output Summary (Last 24 hours) at 09/21/2017 1529 Last data filed at 09/20/2017 1700 Gross per 24 hour  Intake 101.4 ml  Output -  Net 101.4 ml   Filed Weights   09/19/17 1615  Weight: 88.9 kg (196 lb)    Examination:  General exam: Mild distress due to abdominal pain Respiratory system: Clear to auscultation. No wheezes,crackle or rhonchi Cardiovascular system: S1 & S2 heard, RRR. No JVD, murmurs, rubs or gallops Gastrointestinal system: Abdomen soft, non-distended, moderate left lower quadrant tenderness.  No rebound or guarding.  Positive bowel sounds Central nervous system: Alert and oriented. No focal neurological deficits. Extremities: No pedal edema Skin: No rashes, lesions or ulcers Psychiatry: Mood & affect flat  Data Reviewed: I have personally reviewed following labs and imaging studies  CBC: Recent Labs  Lab 09/19/17 1641 09/20/17 0908 09/21/17 0646  WBC 15.9* 14.8* 14.6*  NEUTROABS  --  12.8*  --   HGB 13.1 12.4 12.1  HCT 37.6 36.0 35.6*  MCV 74.8* 76.4* 76.4*  PLT 482* 378 414*  Basic Metabolic Panel: Recent Labs  Lab 09/19/17 1641 09/20/17 0908 09/21/17 0646  NA 136 137 135  K 3.8 4.1 4.0  CL 101 103 104  CO2 24 23 25   GLUCOSE 343* 238* 199*  BUN 13 11 20   CREATININE 1.05* 0.97 1.39*  CALCIUM 9.5 8.9 9.0   GFR: Estimated Creatinine Clearance: 59.7 mL/min (A) (by C-G formula based on SCr of  1.39 mg/dL (H)). Liver Function Tests: Recent Labs  Lab 09/19/17 1641 09/20/17 0908  AST 17 22  ALT 9* 10*  ALKPHOS 80 70  BILITOT 0.5 0.6  PROT 9.3* 7.8  ALBUMIN 4.0 3.3*   Recent Labs  Lab 09/19/17 1641  LIPASE 36   No results for input(s): AMMONIA in the last 168 hours. Coagulation Profile: Recent Labs  Lab 09/20/17 0908  INR 1.07   Cardiac Enzymes: No results for input(s): CKTOTAL, CKMB, CKMBINDEX, TROPONINI in the last 168 hours. BNP (last 3 results) No results for input(s): PROBNP in the last 8760 hours. HbA1C: No results for input(s): HGBA1C in the last 72 hours. CBG: Recent Labs  Lab 09/20/17 0843 09/20/17 1207 09/20/17 1753 09/21/17 0755 09/21/17 1133  GLUCAP 216* 202* 259* 201* 80   Lipid Profile: No results for input(s): CHOL, HDL, LDLCALC, TRIG, CHOLHDL, LDLDIRECT in the last 72 hours. Thyroid Function Tests: No results for input(s): TSH, T4TOTAL, FREET4, T3FREE, THYROIDAB in the last 72 hours. Anemia Panel: No results for input(s): VITAMINB12, FOLATE, FERRITIN, TIBC, IRON, RETICCTPCT in the last 72 hours. Sepsis Labs: No results for input(s): PROCALCITON, LATICACIDVEN in the last 168 hours.  No results found for this or any previous visit (from the past 240 hour(s)).    Radiology Studies: Ct Abdomen Pelvis W Contrast  Result Date: 09/21/2017 CLINICAL DATA:  Diffuse abdominal pain, nausea, and vomiting. EXAM: CT ABDOMEN AND PELVIS WITH CONTRAST TECHNIQUE: Multidetector CT imaging of the abdomen and pelvis was performed using the standard protocol following bolus administration of intravenous contrast. CONTRAST:  12mL OMNIPAQUE IOHEXOL 300 MG/ML  SOLN COMPARISON:  12/24/2016 FINDINGS: Lower chest: The visualized lung bases are clear. A 2 cm pericardial or other cyst abutting the posterior margin of the left atrium is unchanged. Hepatobiliary: The hepatic dome was incompletely imaged on the portal venous phase images. No focal liver abnormality is  identified. The gallbladder is unremarkable. There is no biliary dilatation. Pancreas: Mild fatty infiltration in the pancreatic head. No ductal dilatation or peripancreatic inflammation. Spleen: Unremarkable. Adrenals/Urinary Tract: Unremarkable adrenal glands. Excreted IV contrast in the renal collecting systems. No evidence of renal mass or hydronephrosis. Bladder contains a small amount of excreted contrast and is otherwise unremarkable. Stomach/Bowel: The stomach is within normal limits. There is no evidence of bowel obstruction. The appendix is unremarkable. Vascular/Lymphatic: No significant vascular findings are present. No enlarged abdominal or pelvic lymph nodes. Reproductive: Exophytic uterine fibroid has decreased in size, with the hypoattenuating portion of the mass now measuring 4.1 cm (previously 6.0 cm). Unremarkable ovaries. Other: No intraperitoneal free fluid.  No abdominal wall hernia. Musculoskeletal: Advanced disc degeneration at L5-S1 with bilateral neural foraminal stenosis. IMPRESSION: 1. No acute abnormality identified in the abdomen or pelvis. 2. Decreased size of previously treated uterine fibroid. Electronically Signed   By: Logan Bores M.D.   On: 09/21/2017 12:56      Scheduled Meds: . amLODipine  10 mg Oral Daily  . feeding supplement (GLUCERNA SHAKE)  237 mL Oral TID BM  . fluticasone  1 spray Each Nare BID  .  insulin aspart  0-15 Units Subcutaneous TID WC  . insulin aspart  0-5 Units Subcutaneous QHS  . insulin NPH Human  12 Units Subcutaneous BID AC  . metoprolol tartrate  100 mg Oral BID  . pantoprazole  40 mg Oral Daily   Continuous Infusions: . heparin 1,300 Units/hr (09/21/17 0511)     LOS: 0 days    Time spent: Total of 35 minutes spent with pt, greater than 50% of which was spent in discussion of  treatment, counseling and coordination of care    [name], MD Pager: Text Page via [email or web address]   If 7PM-7AM, please contact  night-coverage [email or web address] 09/21/2017, 3:29 PM   Note - This record has been created using [employer]. Chart creation errors have been sought, but may not always have been located. Such creation errors do not reflect on the standard of medical care.

## 2017-09-21 NOTE — Progress Notes (Signed)
Per NM gastric emptying study will not be done until Monday. MD notified via text page.

## 2017-09-21 NOTE — Progress Notes (Signed)
ANTICOAGULATION CONSULT NOTE - Follow Up Consult  Pharmacy Consult for Heparin Indication: Hx PE on apixaban PTA, held d/t N/V  Allergies  Allergen Reactions  . Lisinopril Nausea Only and Swelling  . Morphine And Related Itching and Swelling    Patient Measurements: Height: 5\' 7"  (170.2 cm) Weight: 196 lb (88.9 kg) IBW/kg (Calculated) : 61.6 Heparin Dosing Weight: 80.6 kg  Vital Signs: Temp: 98.9 F (37.2 C) (05/31 0555) Temp Source: Oral (05/31 0555) BP: 156/118 (05/31 0555) Pulse Rate: 101 (05/31 0555)  Labs: Recent Labs    09/19/17 1641  09/20/17 0908 09/20/17 1447 09/20/17 2146 09/21/17 0646  HGB 13.1  --  12.4  --   --  12.1  HCT 37.6  --  36.0  --   --  35.6*  PLT 482*  --  378  --   --  414*  APTT  --   --  24  --   --   --   LABPROT  --   --  13.8  --   --   --   INR  --   --  1.07  --   --   --   HEPARINUNFRC  --    < > <0.10* 0.37 0.41 0.43  CREATININE 1.05*  --  0.97  --   --  1.39*   < > = values in this interval not displayed.    Estimated Creatinine Clearance: 59.7 mL/min (A) (by C-G formula based on SCr of 1.39 mg/dL (H)).   Medications:  Infusions:  . heparin 1,300 Units/hr (09/21/17 0511)    Assessment: 41 yoF presented to the ED on 5/29 PM with intractable N/V, suspected recurrent gastroparesis.  PMH is significant for PE, CVA on chronic apixaban anticoagulation.  Pharmacy is consulted to dose Heparin IV while she is unable to tolerate PO.  PTA apixaban 5 mg PO BID.  Pt states she hasn't had any medications in the past week (not further specified). Baseline coags are WNL (HL < 0.1 and APTT 24)  Today, 09/21/2017:  Heparin level continues to be therapeutic on current rate of 1300 units/hr  CBC stable  No reported bleeding  Goal of Therapy:  Heparin level 0.3-0.7 units/ml Monitor platelets by anticoagulation protocol: Yes   Plan:  1) Continue heparin IV infusion at 1300 units/hr 2) Daily heparin level and CBC 3) Await plan to  change back to apixaban   Adrian Saran, PharmD, BCPS Pager [phone] 09/21/2017 7:59 AM

## 2017-09-22 LAB — GLUCOSE, CAPILLARY
GLUCOSE-CAPILLARY: 160 mg/dL — AB (ref 65–99)
GLUCOSE-CAPILLARY: 127 mg/dL — AB (ref 65–99)
GLUCOSE-CAPILLARY: 397 mg/dL — AB (ref 65–99)
GLUCOSE-CAPILLARY: 148 mg/dL — AB (ref 65–99)
Glucose-Capillary: 309 mg/dL — ABNORMAL HIGH (ref 65–99)

## 2017-09-22 LAB — HEPARIN LEVEL (UNFRACTIONATED)
Heparin Unfractionated: 0.84 IU/mL — ABNORMAL HIGH (ref 0.30–0.70)
Heparin Unfractionated: 0.6 IU/mL (ref 0.30–0.70)

## 2017-09-22 LAB — CBC
HEMATOCRIT: 34.7 % — AB (ref 36.0–46.0)
HEMOGLOBIN: 11.8 g/dL — AB (ref 12.0–15.0)
MCH: 26.2 pg (ref 26.0–34.0)
MCHC: 34 g/dL (ref 30.0–36.0)
MCV: 76.9 fL — AB (ref 78.0–100.0)
Platelets: 385 10*3/uL (ref 150–400)
RBC: 4.51 MIL/uL (ref 3.87–5.11)
RDW: 16 % — ABNORMAL HIGH (ref 11.5–15.5)
WBC: 13.6 10*3/uL — ABNORMAL HIGH (ref 4.0–10.5)

## 2017-09-22 LAB — BASIC METABOLIC PANEL
Anion gap: 6 (ref 5–15)
BUN: 18 mg/dL (ref 6–20)
CHLORIDE: 106 mmol/L (ref 101–111)
CO2: 24 mmol/L (ref 22–32)
Calcium: 8.6 mg/dL — ABNORMAL LOW (ref 8.9–10.3)
Creatinine, Ser: 1.41 mg/dL — ABNORMAL HIGH (ref 0.44–1.00)
GFR calc Af Amer: 52 mL/min — ABNORMAL LOW (ref >60)
GFR calc non Af Amer: 45 mL/min — ABNORMAL LOW (ref >60)
Glucose, Bld: 291 mg/dL — ABNORMAL HIGH (ref 65–99)
POTASSIUM: 3.6 mmol/L (ref 3.5–5.1)
SODIUM: 136 mmol/L (ref 135–145)

## 2017-09-22 LAB — MAGNESIUM: MAGNESIUM: 1.8 mg/dL (ref 1.7–2.4)

## 2017-09-22 MED ORDER — APIXABAN 5 MG PO TABS
5.0000 mg | ORAL_TABLET | Freq: Two times a day (BID) | ORAL | Status: DC
  Administered 2017-09-22 – 2017-09-23 (×2): 5 mg via ORAL
  Filled 2017-09-22 (×3): qty 1

## 2017-09-22 MED ORDER — HEPARIN (PORCINE) IN NACL 100-0.45 UNIT/ML-% IJ SOLN
1150.0000 [IU]/h | INTRAMUSCULAR | Status: AC
  Filled 2017-09-22: qty 250

## 2017-09-22 NOTE — Progress Notes (Signed)
Pt. CBG=309 medicated with Novolog 11 units per order. MD notified and NPH held per MD unless recheck CBG high. 1 hour Recheck CBG=160 NPH held.

## 2017-09-22 NOTE — Progress Notes (Addendum)
ANTICOAGULATION CONSULT NOTE - Follow Up Consult  Pharmacy Consult for heparin Indication: hx pulmonary embolus (home Eliquis on hold)  Allergies  Allergen Reactions  . Lisinopril Nausea Only and Swelling  . Morphine And Related Itching and Swelling    Patient Measurements: Height: 5\' 7"  (170.2 cm) Weight: 196 lb (88.9 kg) IBW/kg (Calculated) : 61.6 Heparin Dosing Weight: 80 kg  Vital Signs: Temp: 98.4 F (36.9 C) (06/01 0449) Temp Source: Oral (06/01 0449) BP: 112/63 (06/01 0449) Pulse Rate: 67 (06/01 0449)  Labs: Recent Labs    09/19/17 1641 09/20/17 0908  09/20/17 2146 09/21/17 0646 09/22/17 0620  HGB 13.1 12.4  --   --  12.1 11.8*  HCT 37.6 36.0  --   --  35.6* 34.7*  PLT 482* 378  --   --  414* 385  APTT  --  24  --   --   --   --   LABPROT  --  13.8  --   --   --   --   INR  --  1.07  --   --   --   --   HEPARINUNFRC  --  <0.10*   < > 0.41 0.43 0.84*  CREATININE 1.05* 0.97  --   --  1.39*  --    < > = values in this interval not displayed.    Estimated Creatinine Clearance: 59.7 mL/min (A) (by C-G formula based on SCr of 1.39 mg/dL (H)).   Medications:  - Eliquis 5 mg bid PTA  Assessment: Patient's a 42 y.o F with hx PE on Eliquis PTA, presented to the ED on 5/29 with c/o abd pain and intractable n/v.  Eliquis transitioned to heparin drip on admission.  Today, 09/22/2017: - heparin level supratherapeutic this morning at 0.84 - cbc relatively stable - no bleeding documented - scr increased to 1.39 yesterday  Goal of Therapy:  Heparin level 0.3-0.7 units/ml Monitor platelets by anticoagulation protocol: Yes   Plan:  - decrease heparin drip to 1150 units/hr - check 6 hr heparin level - monitor for s/s bleeding  Adden (at 2:14p): Heparin level now back therapeutic at 0.60 with rate decreased to 1150 units/hr this morning.   - continue with current rate of 1150 units/hr for now - recheck another level at 8PM to ensure level is still therapeutic  before changing to daily monitoring  Arta Stump P 09/22/2017,7:09 AM

## 2017-09-22 NOTE — Progress Notes (Addendum)
PROGRESS NOTE [facility]   Shirley [name]   NWG:[id number] DOB: [birth year]  DOA: 09/19/2017 PCP: Clent Demark, PA-C   Brief Narrative:  Shirley [name] 42 year old female with medical history of diabetes mellitus type 2, hypertension, history of PE and CVA who presented to the emergency department complaining of persistent nausea and vomiting.  Associated symptoms include abdominal pain.  Upon ED evaluation she was found to be severe hypertensive, elevated blood glucose and UDS positive for cocaine.  Patient was admitted with working diagnosis of intractable nausea and vomiting.  Subjective: Patient seen and examined, she is c/o abdominal pain and nausea, no further emesis. No acute events overnight. Afebrile. Having regular BM's. Had full liquids but became very nauseous.   Assessment & Plan: Intractable nausea and vomiting with abdominal pain - improved Strongly felt that this is related to gastroparesis in combination of hypertensive crisis and cocaine induced gastritis.  CT abdomen and pelvis was negative.  Patient has improved after starting Reglan 3 times daily. Diet has been advanced and she is tolerating well.  She will need gastric history as an outpatient.  Continue tight glucose control.  Protonix 40 mg twice daily.  Pain control as needed.  Hypertensive crisis BP has improved with metoprolol and amlodipine.  Will discontinue hydrochlorothiazide. Continue hydralazine and labetalol as needed.   To need to monitor BP closely  AKI Due to dehydration from nausea and vomiting. Creatinine still slightly elevated, encourage oral hydration. Check renal function in a.m. if remains stable will DC and follow-up as an outpatient.  Diabetes mellitus type 2 uncontrolled with hyperglycemia Last A1c April 2019 was 10.2.  Patient not compliant with medication Continue NPH 12 units twice daily Moderate SSI Follow-up CBGs closely  History of PE on chronic  anticoagulation Discontinue heparin drip, resume Eliquis  Polysubstance abuse Cessation discussed, have discussed with patient that cocaine could cause increasing blood pressure and increased risk for gastritis.  Patient verbalized understanding  DVT prophylaxis: Heparin drip Code Status: Full code Family Communication: None at bedside Disposition Plan: Home in a.m. if renal function stable  Consultants:   None  Procedures:   None  Antimicrobials:  None   Objective: Vitals:   09/21/17 2019 09/22/17 0449 09/22/17 1100 09/22/17 1427  BP: 117/82 112/63 116/76 (!) 155/95  Pulse: 96 67 89 87  Resp: 18 18  18   Temp: 97.8 F (36.6 C) 98.4 F (36.9 C)  98.5 F (36.9 C)  TempSrc: Oral Oral  Oral  SpO2: 100% 97%  100%  Weight:      Height:        Intake/Output Summary (Last 24 hours) at 09/22/2017 1703 Last data filed at 09/22/2017 1608 Gross per 24 hour  Intake 2301.33 ml  Output -  Net 2301.33 ml   Filed Weights   09/19/17 1615  Weight: 88.9 kg (196 lb)    Examination:  General: Pt is alert, awake, not in acute distress Cardiovascular: RRR, S1/S2 +, no rubs, no gallops Respiratory: CTA bilaterally, no wheezing, no rhonchi Abdominal: Soft, NT, ND, bowel sounds + Extremities: no edema, no cyanosis  Data Reviewed: I have personally reviewed following labs and imaging studies  CBC: Recent Labs  Lab 09/19/17 1641 09/20/17 0908 09/21/17 0646 09/22/17 0620  WBC 15.9* 14.8* 14.6* 13.6*  NEUTROABS  --  12.8*  --   --   HGB 13.1 12.4 12.1 11.8*  HCT 37.6 36.0 35.6* 34.7*  MCV 74.8* 76.4* 76.4* 76.9*  PLT 482* 378 414* 385  Basic Metabolic Panel: Recent Labs  Lab 09/19/17 1641 09/20/17 0908 09/21/17 0646 09/22/17 0620  NA 136 137 135 136  K 3.8 4.1 4.0 3.6  CL 101 103 104 106  CO2 24 23 25 24   GLUCOSE 343* 238* 199* 291*  BUN 13 11 20 18   CREATININE 1.05* 0.97 1.39* 1.41*  CALCIUM 9.5 8.9 9.0 8.6*  MG  --   --   --  1.8   GFR: Estimated  Creatinine Clearance: 58.9 mL/min (A) (by C-G formula based on SCr of 1.41 mg/dL (H)). Liver Function Tests: Recent Labs  Lab 09/19/17 1641 09/20/17 0908  AST 17 22  ALT 9* 10*  ALKPHOS 80 70  BILITOT 0.5 0.6  PROT 9.3* 7.8  ALBUMIN 4.0 3.3*   Recent Labs  Lab 09/19/17 1641  LIPASE 36   No results for input(s): AMMONIA in the last 168 hours. Coagulation Profile: Recent Labs  Lab 09/20/17 0908  INR 1.07   Cardiac Enzymes: No results for input(s): CKTOTAL, CKMB, CKMBINDEX, TROPONINI in the last 168 hours. BNP (last 3 results) No results for input(s): PROBNP in the last 8760 hours. HbA1C: No results for input(s): HGBA1C in the last 72 hours. CBG: Recent Labs  Lab 09/21/17 1721 09/21/17 2139 09/22/17 0750 09/22/17 1027 09/22/17 1211  GLUCAP 93 214* 309* 160* 127*   Lipid Profile: No results for input(s): CHOL, HDL, LDLCALC, TRIG, CHOLHDL, LDLDIRECT in the last 72 hours. Thyroid Function Tests: No results for input(s): TSH, T4TOTAL, FREET4, T3FREE, THYROIDAB in the last 72 hours. Anemia Panel: No results for input(s): VITAMINB12, FOLATE, FERRITIN, TIBC, IRON, RETICCTPCT in the last 72 hours. Sepsis Labs: No results for input(s): PROCALCITON, LATICACIDVEN in the last 168 hours.  No results found for this or any previous visit (from the past 240 hour(s)).    Radiology Studies: Ct Abdomen Pelvis W Contrast  Result Date: 09/21/2017 CLINICAL DATA:  Diffuse abdominal pain, nausea, and vomiting. EXAM: CT ABDOMEN AND PELVIS WITH CONTRAST TECHNIQUE: Multidetector CT imaging of the abdomen and pelvis was performed using the standard protocol following bolus administration of intravenous contrast. CONTRAST:  126mL OMNIPAQUE IOHEXOL 300 MG/ML  SOLN COMPARISON:  12/24/2016 FINDINGS: Lower chest: The visualized lung bases are clear. A 2 cm pericardial or other cyst abutting the posterior margin of the left atrium is unchanged. Hepatobiliary: The hepatic dome was incompletely  imaged on the portal venous phase images. No focal liver abnormality is identified. The gallbladder is unremarkable. There is no biliary dilatation. Pancreas: Mild fatty infiltration in the pancreatic head. No ductal dilatation or peripancreatic inflammation. Spleen: Unremarkable. Adrenals/Urinary Tract: Unremarkable adrenal glands. Excreted IV contrast in the renal collecting systems. No evidence of renal mass or hydronephrosis. Bladder contains a small amount of excreted contrast and is otherwise unremarkable. Stomach/Bowel: The stomach is within normal limits. There is no evidence of bowel obstruction. The appendix is unremarkable. Vascular/Lymphatic: No significant vascular findings are present. No enlarged abdominal or pelvic lymph nodes. Reproductive: Exophytic uterine fibroid has decreased in size, with the hypoattenuating portion of the mass now measuring 4.1 cm (previously 6.0 cm). Unremarkable ovaries. Other: No intraperitoneal free fluid.  No abdominal wall hernia. Musculoskeletal: Advanced disc degeneration at L5-S1 with bilateral neural foraminal stenosis. IMPRESSION: 1. No acute abnormality identified in the abdomen or pelvis. 2. Decreased size of previously treated uterine fibroid. Electronically Signed   By: Logan Bores M.D.   On: 09/21/2017 12:56    Scheduled Meds: . amLODipine  10 mg Oral Daily  . apixaban  5 mg Oral BID  . feeding supplement (GLUCERNA SHAKE)  237 mL Oral TID BM  . fluticasone  1 spray Each Nare BID  . insulin aspart  0-15 Units Subcutaneous TID WC  . insulin aspart  0-5 Units Subcutaneous QHS  . insulin NPH Human  12 Units Subcutaneous BID AC  . metoCLOPramide  10 mg Oral TID AC  . metoprolol tartrate  100 mg Oral BID  . pantoprazole  40 mg Oral BID   Continuous Infusions:    LOS: 1 day    Time spent: Total of 25 minutes spent with pt, greater than 50% of which was spent in discussion of  treatment, counseling and coordination of care   [name],  MD Pager: Text Page via [email or web address]   If 7PM-7AM, please contact night-coverage [email or web address] 09/22/2017, 5:03 PM   Note - This record has been created using [employer]. Chart creation errors have been sought, but may not always have been located. Such creation errors do not reflect on the standard of medical care.

## 2017-09-22 NOTE — Progress Notes (Signed)
ANTICOAGULATION CONSULT NOTE - Follow Up Consult  Pharmacy Consult for heparin => Eliquis Indication: hx pulmonary embolus  Allergies  Allergen Reactions  . Lisinopril Nausea Only and Swelling  . Morphine And Related Itching and Swelling    Patient Measurements: Height: 5\' 7"  (170.2 cm) Weight: 196 lb (88.9 kg) IBW/kg (Calculated) : 61.6 Heparin Dosing Weight: 80 kg  Vital Signs: Temp: 98.5 F (36.9 C) (06/01 1427) Temp Source: Oral (06/01 1427) BP: 155/95 (06/01 1427) Pulse Rate: 87 (06/01 1427)  Labs: Recent Labs    09/20/17 0908  09/21/17 0646 09/22/17 0620 09/22/17 1349  HGB 12.4  --  12.1 11.8*  --   HCT 36.0  --  35.6* 34.7*  --   PLT 378  --  414* 385  --   APTT 24  --   --   --   --   LABPROT 13.8  --   --   --   --   INR 1.07  --   --   --   --   HEPARINUNFRC <0.10*   < > 0.43 0.84* 0.60  CREATININE 0.97  --  1.39* 1.41*  --    < > = values in this interval not displayed.    Estimated Creatinine Clearance: 58.9 mL/min (A) (by C-G formula based on SCr of 1.41 mg/dL (H)).   Medications:  - Eliquis 5 mg bid PTA  Assessment: Patient's a 42 y.o F with hx PE on Eliquis PTA, presented to the ED on 5/29 with c/o abd pain and intractable n/v.  Eliquis transitioned to heparin drip on admission.  Today, 09/22/2017: - heparin level supratherapeutic this morning at 0.84 - cbc relatively stable - no bleeding documented - scr increased to 1.39 yesterday  09/22/17 2nd shift: Dr Quincy Simmonds called to request IV heparin be d/c'ed and pharmacy consulted to restart Eliquis  Goal of Therapy:  Monitor platelets by anticoagulation protocol: Yes   Plan:  - Spoke with RN and plan to D/C heparin infusion @ 17:00, at which time will restart Eliquis 5mg  po BID - monitor for s/s bleeding  Paz Fuentes, Toribio Harbour, PharmD 09/22/2017,4:43 PM

## 2017-09-23 DIAGNOSIS — I160 Hypertensive urgency: Secondary | ICD-10-CM

## 2017-09-23 DIAGNOSIS — I2699 Other pulmonary embolism without acute cor pulmonale: Secondary | ICD-10-CM

## 2017-09-23 DIAGNOSIS — E1142 Type 2 diabetes mellitus with diabetic polyneuropathy: Secondary | ICD-10-CM

## 2017-09-23 DIAGNOSIS — R112 Nausea with vomiting, unspecified: Secondary | ICD-10-CM

## 2017-09-23 DIAGNOSIS — F1910 Other psychoactive substance abuse, uncomplicated: Secondary | ICD-10-CM

## 2017-09-23 DIAGNOSIS — E1165 Type 2 diabetes mellitus with hyperglycemia: Secondary | ICD-10-CM

## 2017-09-23 LAB — GLUCOSE, CAPILLARY
GLUCOSE-CAPILLARY: 170 mg/dL — AB (ref 65–99)
Glucose-Capillary: 280 mg/dL — ABNORMAL HIGH (ref 65–99)

## 2017-09-23 LAB — BASIC METABOLIC PANEL
ANION GAP: 7 (ref 5–15)
BUN: 20 mg/dL (ref 6–20)
CO2: 21 mmol/L — ABNORMAL LOW (ref 22–32)
Calcium: 8.2 mg/dL — ABNORMAL LOW (ref 8.9–10.3)
Chloride: 105 mmol/L (ref 101–111)
Creatinine, Ser: 1.47 mg/dL — ABNORMAL HIGH (ref 0.44–1.00)
GFR calc Af Amer: 49 mL/min — ABNORMAL LOW (ref >60)
GFR, EST NON AFRICAN AMERICAN: 43 mL/min — AB (ref >60)
GLUCOSE: 305 mg/dL — AB (ref 65–99)
POTASSIUM: 4.1 mmol/L (ref 3.5–5.1)
Sodium: 133 mmol/L — ABNORMAL LOW (ref 135–145)

## 2017-09-23 LAB — MAGNESIUM: Magnesium: 1.6 mg/dL — ABNORMAL LOW (ref 1.7–2.4)

## 2017-09-23 MED ORDER — TRAMADOL HCL 50 MG PO TABS: 50.0000 mg | ORAL_TABLET | Freq: Two times a day (BID) | ORAL | 0 refills | Status: DC | PRN

## 2017-09-23 MED ORDER — MAGNESIUM OXIDE 400 (241.3 MG) MG PO TABS
800.0000 mg | ORAL_TABLET | Freq: Once | ORAL | Status: AC
  Administered 2017-09-23: 800 mg via ORAL
  Filled 2017-09-23: qty 2

## 2017-09-23 MED ORDER — SALINE SPRAY 0.65 % NA SOLN
1.0000 | NASAL | Status: DC | PRN
  Filled 2017-09-23: qty 44

## 2017-09-23 MED ORDER — METOCLOPRAMIDE HCL 10 MG PO TABS: 10.0000 mg | ORAL_TABLET | Freq: Three times a day (TID) | ORAL | 0 refills | Status: DC

## 2017-09-23 MED ORDER — AMLODIPINE BESYLATE 10 MG PO TABS: 10.0000 mg | ORAL_TABLET | Freq: Every day | ORAL | 0 refills | Status: DC

## 2017-09-23 MED ORDER — ONDANSETRON HCL 4 MG PO TABS: 4.0000 mg | ORAL_TABLET | Freq: Four times a day (QID) | ORAL | 0 refills | Status: DC | PRN

## 2017-09-23 MED ORDER — PANTOPRAZOLE SODIUM 40 MG PO TBEC: 40.0000 mg | DELAYED_RELEASE_TABLET | Freq: Two times a day (BID) | ORAL | 0 refills | Status: DC

## 2017-09-23 NOTE — Discharge Summary (Signed)
Physician Discharge Summary  Shirley [name]  YIF:[id number]  DOB: 1975/12/30  DOA: 09/19/2017 PCP: Clent Demark, PA-C  Admit date: 09/19/2017 Discharge date: 09/23/2017  Admitted From: Home Disposition: Home   Recommendations for Outpatient Follow-up:  1. Follow up with PCP in 1 week 2. Please obtain BMP/CBC in one week to monitor Hgb and renal function  3. Need Gastric emptying studies  4. GI follow up  5. Cocaine cessation   Discharge Condition: Home CODE STATUS: Full code Diet recommendation: Heart Healthy / Carb Modified  Brief/Interim Summary: For full details see H&P/Progress note, but in brief, Shirley [name] is a 42 year old female with medical history of diabetes mellitus type 2, hypertension, history of PE and CVA who presented to the emergency department complaining of persistent nausea and vomiting.  Associated symptoms include abdominal pain.  Upon ED evaluation she was found to be severe hypertensive, elevated blood glucose and UDS positive for cocaine.  Patient was admitted with working diagnosis of intractable nausea and vomiting. Patient was treated with pain medication, Reglan and IVF. Subsequently patient improved clinically, tolerated diet well and she was deemed stable for discharge.   Subjective: Patient seen and examined, she continues to tolerate diet well.  Denies abdominal pain.  No acute events overnight.   Discharge Diagnoses/Hospital Course:  Intractable nausea and vomiting with abdominal pain - improved Strongly felt that this is related to gastroparesis in combination of hypertensive crisis and cocaine induced gastritis. CT abdomen and pelvis was negative. Patient improved after starting Reglan 3 times daily. Diet has been advanced and she is tolerating well.  She will need gastric emptying studies as an outpatient. Continue tight glucose control.  Advised to eat small frequent meals.patient was previously discharged on erythromycin, but not taking  it.  Will discharge on Reglan, Protonix 40 mg twice daily and Zofran as needed. Follow-up with PCP  Hypertensive crisis - improved BP now stable, she was treated with hydralazine and labetalol. Hydrochlorothiazide has been discontinued, she was started on amlodipine.  Patient will be discharged home metoprolol and amlodipine.  Follow-up with PCP in 1 week  AKI Due to dehydration from nausea and vomiting. Creatinine has plateau, encourage oral hydration Outpatient follow-up in 1 week.  Diabetes mellitus type 2 uncontrolled with hyperglycemia Last A1c April 2019 was 10.2.  Patient not compliant with medication Stressed the importance of compliance with medication as this will help with her gastroparesis.  We will continue NPH 12 units twice daily, patient will probably benefit from metformin.  Will defer to PCP, can add after repeat renal function in 1 week.   History of PE on chronic anticoagulation While she was n.p.o. she was treated with heparin drip, Eliquis has been resume.  Polysubstance abuse Cessation discussed, have discussed with patient that cocaine could cause increasing blood pressure and increased risk for gastritis.  Patient verbalized understanding  All other chronic medical condition were stable during the hospitalization.  On the day of the discharge the patient's vitals were stable, and no other acute medical condition were reported by patient. the patient was felt safe to be discharge to home.   Discharge Instructions  You were cared for by a hospitalist during your hospital stay. If you have any questions about your discharge medications or the care you received while you were in the hospital after you are discharged, you can call the unit and asked to speak with the hospitalist on call if the hospitalist that took care of you is not available.  Once you are discharged, your primary care physician will handle any further medical issues. Please note that NO REFILLS  for any discharge medications will be authorized once you are discharged, as it is imperative that you return to your primary care physician (or establish a relationship with a primary care physician if you do not have one) for your aftercare needs so that they can reassess your need for medications and monitor your lab values.  Discharge Instructions    Call MD for:  difficulty breathing, headache or visual disturbances   Complete by:  As directed    Call MD for:  extreme fatigue   Complete by:  As directed    Call MD for:  hives   Complete by:  As directed    Call MD for:  persistant dizziness or light-headedness   Complete by:  As directed    Call MD for:  persistant nausea and vomiting   Complete by:  As directed    Call MD for:  redness, tenderness, or signs of infection (pain, swelling, redness, odor or green/yellow discharge around incision site)   Complete by:  As directed    Call MD for:  severe uncontrolled pain   Complete by:  As directed    Call MD for:  temperature >100.4   Complete by:  As directed    Diet - low sodium heart healthy   Complete by:  As directed    Increase activity slowly   Complete by:  As directed      Allergies as of 09/23/2017      Reactions   Lisinopril Nausea Only, Swelling   Morphine And Related Itching, Swelling      Medication List    STOP taking these medications   erythromycin ethylsuccinate 200 MG/5ML suspension Commonly known as:  EES   hydrochlorothiazide 25 MG tablet Commonly known as:  HYDRODIURIL   promethazine 25 MG tablet Commonly known as:  PHENERGAN     TAKE these medications   albuterol 108 (90 Base) MCG/ACT inhaler Commonly known as:  PROVENTIL HFA;VENTOLIN HFA Inhale 2 puffs into the lungs every 4 (four) hours as needed for wheezing or shortness of breath.   amLODipine 10 MG tablet Commonly known as:  NORVASC Take 1 tablet (10 mg total) by mouth daily. Start taking on:  09/24/2017   apixaban 5 MG Tabs  tablet Commonly known as:  ELIQUIS Take 1 tablet (5 mg total) by mouth 2 (two) times daily.   feeding supplement (GLUCERNA SHAKE) Liqd Take 237 mLs by mouth 3 (three) times daily between meals.   freestyle lancets Use as instructed   glucose blood test strip Commonly known as:  FREESTYLE TEST STRIPS Use as instructed   glucose monitoring kit monitoring kit 1 each by Does not apply route 4 (four) times daily - after meals and at bedtime. 1 month Diabetic Testing Supplies for QAC-QHS accuchecks.   insulin NPH Human 100 UNIT/ML injection Commonly known as:  HUMULIN N Inject 0.12 mLs (12 Units total) into the skin 2 (two) times daily before a meal. What changed:  additional instructions   INSULIN SYRINGE .5CC/28G 28G X 1/2" 0.5 ML Misc Commonly known as:  INS SYRINGE/NEEDLE .5CC/28G Please provide 1 month supply   metoCLOPramide 10 MG tablet Commonly known as:  REGLAN Take 1 tablet (10 mg total) by mouth 4 (four) times daily -  before meals and at bedtime.   metoprolol tartrate 100 MG tablet Commonly known as:  LOPRESSOR Take 1 tablet (  100 mg total) by mouth 2 (two) times daily.   ondansetron 4 MG tablet Commonly known as:  ZOFRAN Take 1 tablet (4 mg total) by mouth every 6 (six) hours as needed for nausea.   pantoprazole 40 MG tablet Commonly known as:  PROTONIX Take 1 tablet (40 mg total) by mouth 2 (two) times daily. What changed:  when to take this      Follow-up Information    Clent Demark, PA-C Follow up.   Specialty:  Physician Assistant Contact information:  [id number] [address] Follow up.   Why:  Please make an appointment for FINANCIAL COUNSELING.  Please use this location for your MEDICATION needs. Contact information: [place] [address] [phone]         Allergies  Allergen Reactions  . Lisinopril Nausea Only and  Swelling  . Morphine And Related Itching and Swelling    Consultations:  None    Procedures/Studies: Ct Abdomen Pelvis W Contrast  Result Date: 09/21/2017 CLINICAL DATA:  Diffuse abdominal pain, nausea, and vomiting. EXAM: CT ABDOMEN AND PELVIS WITH CONTRAST TECHNIQUE: Multidetector CT imaging of the abdomen and pelvis was performed using the standard protocol following bolus administration of intravenous contrast. CONTRAST:  117m OMNIPAQUE IOHEXOL 300 MG/ML  SOLN COMPARISON:  12/24/2016 FINDINGS: Lower chest: The visualized lung bases are clear. A 2 cm pericardial or other cyst abutting the posterior margin of the left atrium is unchanged. Hepatobiliary: The hepatic dome was incompletely imaged on the portal venous phase images. No focal liver abnormality is identified. The gallbladder is unremarkable. There is no biliary dilatation. Pancreas: Mild fatty infiltration in the pancreatic head. No ductal dilatation or peripancreatic inflammation. Spleen: Unremarkable. Adrenals/Urinary Tract: Unremarkable adrenal glands. Excreted IV contrast in the renal collecting systems. No evidence of renal mass or hydronephrosis. Bladder contains a small amount of excreted contrast and is otherwise unremarkable. Stomach/Bowel: The stomach is within normal limits. There is no evidence of bowel obstruction. The appendix is unremarkable. Vascular/Lymphatic: No significant vascular findings are present. No enlarged abdominal or pelvic lymph nodes. Reproductive: Exophytic uterine fibroid has decreased in size, with the hypoattenuating portion of the mass now measuring 4.1 cm (previously 6.0 cm). Unremarkable ovaries. Other: No intraperitoneal free fluid.  No abdominal wall hernia. Musculoskeletal: Advanced disc degeneration at L5-S1 with bilateral neural foraminal stenosis. IMPRESSION: 1. No acute abnormality identified in the abdomen or pelvis. 2. Decreased size of previously treated uterine fibroid. Electronically Signed    By: ALogan BoresM.D.   On: 09/21/2017 12:56    Discharge Exam: Vitals:   09/22/17 1427 09/23/17 0435  BP: (!) 155/95 128/82  Pulse: 87 87  Resp: 18   Temp: 98.5 F (36.9 C) 98 F (36.7 C)  SpO2: 100% 100%   Vitals:   09/22/17 0449 09/22/17 1100 09/22/17 1427 09/23/17 0435  BP: 112/63 116/76 (!) 155/95 128/82  Pulse: 67 89 87 87  Resp: 18  18   Temp: 98.4 F (36.9 C)  98.5 F (36.9 C) 98 F (36.7 C)  TempSrc: Oral  Oral Oral  SpO2: 97%  100% 100%  Weight:      Height:        General: Pt is alert, awake, not in acute distress Cardiovascular: RRR, S1/S2 +, no rubs, no gallops Respiratory: CTA bilaterally, no wheezing, no rhonchi Abdominal: Soft, NT, ND, bowel sounds + Extremities: no edema,  no cyanosis   The results of significant diagnostics from this hospitalization (including imaging, microbiology, ancillary and laboratory) are listed below for reference.     Microbiology: No results found for this or any previous visit (from the past 240 hour(s)).   Labs: BNP (last 3 results) No results for input(s): BNP in the last 8760 hours. Basic Metabolic Panel: Recent Labs  Lab 09/19/17 1641 09/20/17 0908 09/21/17 0646 09/22/17 0620 09/23/17 0527  NA 136 137 135 136 133*  K 3.8 4.1 4.0 3.6 4.1  CL 101 103 104 106 105  CO2 _0 21*  GLUCOSE 343* 238* 199* 291* 305*  BUN _1 CREATININE 1.05* 0.97 1.39* 1.41* 1.47*  CALCIUM 9.5 8.9 9.0 8.6* 8.2*  MG  --   --   --  1.8 1.6*   Liver Function Tests: Recent Labs  Lab 09/19/17 1641 09/20/17 0908  AST 17 22  ALT 9* 10*  ALKPHOS 80 70  BILITOT 0.5 0.6  PROT 9.3* 7.8  ALBUMIN 4.0 3.3*   Recent Labs  Lab 09/19/17 1641  LIPASE 36   No results for input(s): AMMONIA in the last 168 hours. CBC: Recent Labs  Lab 09/19/17 1641 09/20/17 0908 09/21/17 0646 09/22/17 0620  WBC 15.9* 14.8* 14.6* 13.6*  NEUTROABS  --  12.8*  --   --   HGB 13.1 12.4 12.1 11.8*  HCT 37.6 36.0 35.6* 34.7*   MCV 74.8* 76.4* 76.4* 76.9*  PLT 482* 378 414* 385   Cardiac Enzymes: No results for input(s): CKTOTAL, CKMB, CKMBINDEX, TROPONINI in the last 168 hours. BNP: Invalid input(s): POCBNP CBG: Recent Labs  Lab 09/22/17 1027 09/22/17 1211 09/22/17 1722 09/22/17 2106 09/23/17 0749  GLUCAP 160* 127* 397* 148* 280*   D-Dimer No results for input(s): DDIMER in the last 72 hours. Hgb A1c No results for input(s): HGBA1C in the last 72 hours. Lipid Profile No results for input(s): CHOL, HDL, LDLCALC, TRIG, CHOLHDL, LDLDIRECT in the last 72 hours. Thyroid function studies No results for input(s): TSH, T4TOTAL, T3FREE, THYROIDAB in the last 72 hours.  Invalid input(s): FREET3 Anemia work up No results for input(s): VITAMINB12, FOLATE, FERRITIN, TIBC, IRON, RETICCTPCT in the last 72 hours. Urinalysis    Component Value Date/Time   COLORURINE YELLOW 09/20/2017 0908   APPEARANCEUR HAZY (A) 09/20/2017 0908   LABSPEC 1.014 09/20/2017 0908   PHURINE 7.0 09/20/2017 0908   GLUCOSEU >=500 (A) 09/20/2017 0908   GLUCOSEU > 1000 mg/dL (A) 07/07/2008 2115   HGBUR NEGATIVE 09/20/2017 0908   HGBUR negative 08/12/2008 1444   BILIRUBINUR NEGATIVE 09/20/2017 0908   BILIRUBINUR neg 02/07/2017 1609   KETONESUR 5 (A) 09/20/2017 0908   PROTEINUR 100 (A) 09/20/2017 0908   UROBILINOGEN 1.0 02/07/2017 1609   UROBILINOGEN 0.2 11/11/2014 1658   NITRITE NEGATIVE 09/20/2017 0908   LEUKOCYTESUR NEGATIVE 09/20/2017 0908   Sepsis Labs Invalid input(s): PROCALCITONIN,  WBC,  LACTICIDVEN Microbiology No results found for this or any previous visit (from the past 240 hour(s)).   Time coordinating discharge: 35 minutes  SIGNED:  [name], MD  [facility] 09/23/2017, 11:15 AM  Pager please text page via  [email or web address]  Note - This record has been created using [employer]. Chart creation errors have been sought, but may not always have been located. Such creation errors do not reflect on  the standard of medical care.

## 2017-09-23 NOTE — Progress Notes (Signed)
Discharge instructions and medications discussed with patient.   Prescriptions and AVS given to patient.  All questions answered. Patient requesting prescription for Tramadol.  MD notified via text page.

## 2017-09-26 ENCOUNTER — Encounter: Payer: Self-pay | Admitting: Obstetrics & Gynecology

## 2017-09-26 ENCOUNTER — Ambulatory Visit (INDEPENDENT_AMBULATORY_CARE_PROVIDER_SITE_OTHER): Payer: Self-pay | Admitting: Obstetrics & Gynecology

## 2017-09-26 ENCOUNTER — Other Ambulatory Visit (HOSPITAL_COMMUNITY)
Admission: RE | Admit: 2017-09-26 | Discharge: 2017-09-26 | Disposition: A | Discharge status: Home / Self Care | Payer: Medicaid Other | Source: Ambulatory Visit | Attending: Obstetrics & Gynecology | Admitting: Obstetrics & Gynecology

## 2017-09-26 ENCOUNTER — Ambulatory Visit: Payer: Self-pay | Admitting: Clinical

## 2017-09-26 ENCOUNTER — Encounter: Payer: Self-pay | Admitting: *Deleted

## 2017-09-26 VITALS — BP 125/86 | HR 101 | Resp 18 | Wt 214.1 lb

## 2017-09-26 DIAGNOSIS — N939 Abnormal uterine and vaginal bleeding, unspecified: Secondary | ICD-10-CM | POA: Diagnosis present

## 2017-09-26 DIAGNOSIS — Z598 Other problems related to housing and economic circumstances: Secondary | ICD-10-CM

## 2017-09-26 DIAGNOSIS — Z658 Other specified problems related to psychosocial circumstances: Secondary | ICD-10-CM

## 2017-09-26 DIAGNOSIS — Z5989 Other problems related to housing and economic circumstances: Secondary | ICD-10-CM

## 2017-09-26 DIAGNOSIS — D219 Benign neoplasm of connective and other soft tissue, unspecified: Secondary | ICD-10-CM

## 2017-09-26 DIAGNOSIS — Z3202 Encounter for pregnancy test, result negative: Secondary | ICD-10-CM

## 2017-09-26 LAB — POCT URINE PREGNANCY: PREG TEST UR: NEGATIVE

## 2017-09-26 NOTE — BH Specialist Note (Signed)
Integrated Behavioral Health Initial Visit  MRN: [id number] Name: Shirley [name]  Number of [place] Clinician visits:: 1/6 Session Start time: 11:59  Session End time: 12:09 Total time: 10 min  Type of Service: [place] Interpretor:No. Interpretor Name and Language: n/a   Warm Hand Off Completed.       SUBJECTIVE: Shirley [name] is a 42 y.o. female accompanied by n/a Patient was referred by Dr Ihor Dow for symptoms of anxiety and depression. Patient reports the following symptoms/concerns: Pt states she has no particular concerns today, mild stress over being uninsured, was treated in the past for depression/anxiety, and is open to receiving educational materials, and consider treatment options in the future.  Duration of problem: Ongoing; Severity of problem: moderate  OBJECTIVE: Mood: Normal and Affect: Appropriate Risk of harm to self or others: No plan to harm self or others  LIFE CONTEXT: Family and Social: - School/Work: - Self-Care: - Life Changes: -  GOALS ADDRESSED: Patient will: 1. Reduce symptoms of: anxiety and depression 2. Increase knowledge and/or ability of: coping skills   INTERVENTIONS: Interventions utilized: Psychoeducation and/or Health Education  Standardized Assessments completed: GAD-7 and PHQ 9  ASSESSMENT: Patient currently experiencing Psychosocial stress.   Patient may benefit from psychoeducation and brief therapeutic interventions regarding coping with symptoms of anxiety and depression .  PLAN: 1. Follow up with behavioral health clinician on : as needed 2. Behavioral recommendations:  -Consider Read educational materials regarding coping with symptoms of anxiety and depression  3. Referral(s): [facility] (In Clinic) 4. "From scale of 1-10, how likely are you to follow plan?": -  Garlan Fair, LCSW

## 2017-09-26 NOTE — Progress Notes (Signed)
History:  42 y.o. G2P2000 here today for pelvic pain on the left side. Pt reports heavy irreg cycles. Pt reports that last month she had 2 cycles.  Pt was prev having daily bleeding with clots until 2 months prev.  Pt is s/p an Endo bx.  Pt reports cramping and bleeding with cycles. Pt is not on Megace.  Pt wants to be considered for definitive surgery.        The following portions of the patient's history were reviewed and updated as appropriate: allergies, current medications, past family history, past medical history, past social history, past surgical history and problem list.  Review of Systems:  Pertinent items are noted in HPI.    Objective:  Physical Exam Blood pressure 125/86, pulse (!) 101, resp. rate 18, weight 214 lb 1.6 oz (97.1 kg), last menstrual period 08/31/2017.  CONSTITUTIONAL: Well-developed, well-nourished female in no acute distress.  HENT:  Normocephalic, atraumatic EYES: Conjunctivae and EOM are normal. No scleral icterus.  NECK: Normal range of motion SKIN: Skin is warm and dry. No rash noted. Not diaphoretic.No pallor. [place]: Alert and oriented to person, place, and time. Normal coordination.  Abd: Soft, nontender and nondistended; obese  Pelvic: Normal appearing external genitalia; normal appearing vaginal mucosa and cervix.  Normal discharge.  enlarged uterus, no other palpable masses, no uterine or adnexal tenderness  The indications for endometrial biopsy were reviewed.   Risks of the biopsy including cramping, bleeding, infection, uterine perforation, inadequate specimen and need for additional procedures  were discussed. The patient states she understands and agrees to undergo procedure today. Consent was signed. Time out was performed. Urine HCG was negative. A sterile speculum was placed in the patient's vagina and the cervix was prepped with Betadine. A single-toothed tenaculum was placed on the anterior lip of the cervix to stabilize it. The 3 mm pipelle  was introduced into the endometrial cavity without difficulty to a depth of 10cm, and a moderate amount of tissue was obtained and sent to pathology. The instruments were removed from the patient's vagina. Minimal bleeding from the cervix was noted. The patient tolerated the procedure well.   Labs and Imaging Ct Abdomen Pelvis W Contrast  Result Date: 09/21/2017 CLINICAL DATA:  Diffuse abdominal pain, nausea, and vomiting. EXAM: CT ABDOMEN AND PELVIS WITH CONTRAST TECHNIQUE: Multidetector CT imaging of the abdomen and pelvis was performed using the standard protocol following bolus administration of intravenous contrast. CONTRAST:  161mL OMNIPAQUE IOHEXOL 300 MG/ML  SOLN COMPARISON:  12/24/2016 FINDINGS: Lower chest: The visualized lung bases are clear. A 2 cm pericardial or other cyst abutting the posterior margin of the left atrium is unchanged. Hepatobiliary: The hepatic dome was incompletely imaged on the portal venous phase images. No focal liver abnormality is identified. The gallbladder is unremarkable. There is no biliary dilatation. Pancreas: Mild fatty infiltration in the pancreatic head. No ductal dilatation or peripancreatic inflammation. Spleen: Unremarkable. Adrenals/Urinary Tract: Unremarkable adrenal glands. Excreted IV contrast in the renal collecting systems. No evidence of renal mass or hydronephrosis. Bladder contains a small amount of excreted contrast and is otherwise unremarkable. Stomach/Bowel: The stomach is within normal limits. There is no evidence of bowel obstruction. The appendix is unremarkable. Vascular/Lymphatic: No significant vascular findings are present. No enlarged abdominal or pelvic lymph nodes. Reproductive: Exophytic uterine fibroid has decreased in size, with the hypoattenuating portion of the mass now measuring 4.1 cm (previously 6.0 cm). Unremarkable ovaries. Other: No intraperitoneal free fluid.  No abdominal wall hernia. Musculoskeletal: Advanced disc  degeneration  at L5-S1 with bilateral neural foraminal stenosis. IMPRESSION: 1. No acute abnormality identified in the abdomen or pelvis. 2. Decreased size of previously treated uterine fibroid. Electronically Signed   By: Logan Bores M.D.   On: 09/21/2017 12:56   12/02/2016 CLINICAL DATA:  Vaginal bleeding since 10/26/2016 with right adnexal tenderness 2 months.  EXAM: TRANSABDOMINAL AND TRANSVAGINAL ULTRASOUND OF PELVIS  DOPPLER ULTRASOUND OF OVARIES  TECHNIQUE: Both transabdominal and transvaginal ultrasound examinations of the pelvis were performed. Transabdominal technique was performed for global imaging of the pelvis including uterus, ovaries, adnexal regions, and pelvic cul-de-sac.  It was necessary to proceed with endovaginal exam following the transabdominal exam to visualize the endometrium and ovaries. Color and duplex Doppler ultrasound was utilized to evaluate blood flow to the ovaries.  COMPARISON:  None.  FINDINGS: Exam difficult due to patient body habitus.  Uterus  Measurements: 8.3 x 6.6 x 16.3 cm. Known large fundal fibroid measuring 6.6 x 6.9 x 7.0 cm.  Endometrium  Not accurately visualized and may be partially obscured by the large fundal fibroid.  Right ovary  Difficult to visualize. Measurements: 0.6 x 1.2 x 1.8 cm. Normal appearance/no adnexal mass.  Left ovary  Measurements: 1.7 x 2.4 x 2.6 cm. Normal appearance/no adnexal mass.  Pulsed Doppler evaluation of both ovaries demonstrates normal low-resistance arterial and venous waveforms.  Other findings  No abnormal free fluid.  IMPRESSION: Known moderate size fundal fibroid measuring 6.6 x 6.9 x 7.0 cm making visualization of the endometrium difficult. This may be the cause of patient's vaginal bleeding.  Ovaries are normal without evidence of torsion.   Assessment & Plan:  AUB- need eval with PA and endo bx.   F/u results of PAP with hrHPV F/u results of endo bx   Routine post-procedure instructions were given to the patient. The patient will follow up to review the results and for further management.    Will send letter to pts primary care provider to see if pt is a candidate for surgery. And if not, if he thinks that her care can be optimized for surgery.  Pt would require a TAH for surgery.   Ryleigh Buenger L. Harraway-Smith, M.D., [facility] June

## 2017-09-27 LAB — CYTOLOGY - PAP
ADEQUACY: ABSENT
Chlamydia: NEGATIVE
Diagnosis: NEGATIVE
HPV: NOT DETECTED
NEISSERIA GONORRHEA: NEGATIVE

## 2017-10-01 ENCOUNTER — Telehealth: Payer: Self-pay | Admitting: *Deleted

## 2017-10-01 ENCOUNTER — Ambulatory Visit (INDEPENDENT_AMBULATORY_CARE_PROVIDER_SITE_OTHER): Payer: Self-pay | Admitting: Physician Assistant

## 2017-10-01 NOTE — Telephone Encounter (Signed)
Called pt and informed her of negative endometrial Bx results per request from Dr. Ihor Dow. Pt voiced understanding and had no questions.

## 2017-10-09 ENCOUNTER — Inpatient Hospital Stay (HOSPITAL_COMMUNITY)
Admission: EM | Admit: 2017-10-09 | Discharge: 2017-10-13 | DRG: 918 | Disposition: A | Discharge status: Home / Self Care | Payer: Self-pay | Attending: Family Medicine | Admitting: Family Medicine

## 2017-10-09 ENCOUNTER — Emergency Department (HOSPITAL_COMMUNITY): Payer: Self-pay

## 2017-10-09 ENCOUNTER — Encounter (HOSPITAL_COMMUNITY): Payer: Self-pay

## 2017-10-09 DIAGNOSIS — R112 Nausea with vomiting, unspecified: Secondary | ICD-10-CM | POA: Diagnosis present

## 2017-10-09 DIAGNOSIS — I160 Hypertensive urgency: Secondary | ICD-10-CM | POA: Diagnosis present

## 2017-10-09 DIAGNOSIS — D72829 Elevated white blood cell count, unspecified: Secondary | ICD-10-CM | POA: Diagnosis present

## 2017-10-09 DIAGNOSIS — R109 Unspecified abdominal pain: Secondary | ICD-10-CM | POA: Diagnosis present

## 2017-10-09 DIAGNOSIS — Z87891 Personal history of nicotine dependence: Secondary | ICD-10-CM

## 2017-10-09 DIAGNOSIS — Z885 Allergy status to narcotic agent status: Secondary | ICD-10-CM

## 2017-10-09 DIAGNOSIS — F1410 Cocaine abuse, uncomplicated: Secondary | ICD-10-CM | POA: Diagnosis present

## 2017-10-09 DIAGNOSIS — K219 Gastro-esophageal reflux disease without esophagitis: Secondary | ICD-10-CM | POA: Diagnosis present

## 2017-10-09 DIAGNOSIS — Z7901 Long term (current) use of anticoagulants: Secondary | ICD-10-CM

## 2017-10-09 DIAGNOSIS — Z86711 Personal history of pulmonary embolism: Secondary | ICD-10-CM | POA: Diagnosis present

## 2017-10-09 DIAGNOSIS — I252 Old myocardial infarction: Secondary | ICD-10-CM

## 2017-10-09 DIAGNOSIS — K3184 Gastroparesis: Secondary | ICD-10-CM | POA: Diagnosis present

## 2017-10-09 DIAGNOSIS — I2510 Atherosclerotic heart disease of native coronary artery without angina pectoris: Secondary | ICD-10-CM | POA: Diagnosis present

## 2017-10-09 DIAGNOSIS — F1910 Other psychoactive substance abuse, uncomplicated: Secondary | ICD-10-CM | POA: Diagnosis present

## 2017-10-09 DIAGNOSIS — Z8673 Personal history of transient ischemic attack (TIA), and cerebral infarction without residual deficits: Secondary | ICD-10-CM

## 2017-10-09 DIAGNOSIS — Z79899 Other long term (current) drug therapy: Secondary | ICD-10-CM

## 2017-10-09 DIAGNOSIS — G43A1 Cyclical vomiting, intractable: Secondary | ICD-10-CM | POA: Diagnosis present

## 2017-10-09 DIAGNOSIS — I10 Essential (primary) hypertension: Secondary | ICD-10-CM | POA: Diagnosis present

## 2017-10-09 DIAGNOSIS — T405X1A Poisoning by cocaine, accidental (unintentional), initial encounter: Principal | ICD-10-CM | POA: Diagnosis present

## 2017-10-09 DIAGNOSIS — E1143 Type 2 diabetes mellitus with diabetic autonomic (poly)neuropathy: Secondary | ICD-10-CM | POA: Diagnosis present

## 2017-10-09 DIAGNOSIS — Z888 Allergy status to other drugs, medicaments and biological substances status: Secondary | ICD-10-CM

## 2017-10-09 DIAGNOSIS — R9431 Abnormal electrocardiogram [ECG] [EKG]: Secondary | ICD-10-CM | POA: Diagnosis present

## 2017-10-09 DIAGNOSIS — R1115 Cyclical vomiting syndrome unrelated to migraine: Secondary | ICD-10-CM

## 2017-10-09 DIAGNOSIS — I4581 Long QT syndrome: Secondary | ICD-10-CM | POA: Diagnosis present

## 2017-10-09 DIAGNOSIS — Z794 Long term (current) use of insulin: Secondary | ICD-10-CM

## 2017-10-09 LAB — URINALYSIS, ROUTINE W REFLEX MICROSCOPIC
Bacteria, UA: NONE SEEN
Bilirubin Urine: NEGATIVE
GLUCOSE, UA: 150 mg/dL — AB
HGB URINE DIPSTICK: NEGATIVE
Ketones, ur: 5 mg/dL — AB
Leukocytes, UA: NEGATIVE
NITRITE: NEGATIVE
PH: 8 (ref 5.0–8.0)
Protein, ur: 100 mg/dL — AB
Specific Gravity, Urine: 1.011 (ref 1.005–1.030)

## 2017-10-09 LAB — CBC WITH DIFFERENTIAL/PLATELET
Basophils Absolute: 0 10*3/uL (ref 0.0–0.1)
Basophils Relative: 0 %
EOS ABS: 0.4 10*3/uL (ref 0.0–0.7)
EOS PCT: 2 %
HEMATOCRIT: 36 % (ref 36.0–46.0)
Hemoglobin: 12.3 g/dL (ref 12.0–15.0)
Lymphocytes Relative: 14 %
Lymphs Abs: 2.2 10*3/uL (ref 0.7–4.0)
MCH: 26 pg (ref 26.0–34.0)
MCHC: 34.2 g/dL (ref 30.0–36.0)
MCV: 76.1 fL — ABNORMAL LOW (ref 78.0–100.0)
MONO ABS: 0.8 10*3/uL (ref 0.1–1.0)
MONOS PCT: 5 %
Neutro Abs: 11.9 10*3/uL — ABNORMAL HIGH (ref 1.7–7.7)
Neutrophils Relative %: 79 %
PLATELETS: 485 10*3/uL — AB (ref 150–400)
RBC: 4.73 MIL/uL (ref 3.87–5.11)
RDW: 16 % — AB (ref 11.5–15.5)
WBC: 15.3 10*3/uL — ABNORMAL HIGH (ref 4.0–10.5)

## 2017-10-09 LAB — COMPREHENSIVE METABOLIC PANEL
ALBUMIN: 3.2 g/dL — AB (ref 3.5–5.0)
ALT: 8 U/L — ABNORMAL LOW (ref 14–54)
ANION GAP: 10 (ref 5–15)
AST: 19 U/L (ref 15–41)
Alkaline Phosphatase: 67 U/L (ref 38–126)
BILIRUBIN TOTAL: 0.2 mg/dL — AB (ref 0.3–1.2)
BUN: 13 mg/dL (ref 6–20)
CHLORIDE: 104 mmol/L (ref 101–111)
CO2: 26 mmol/L (ref 22–32)
Calcium: 9.3 mg/dL (ref 8.9–10.3)
Creatinine, Ser: 0.97 mg/dL (ref 0.44–1.00)
GFR calc Af Amer: >60 mL/min (ref >60)
GLUCOSE: 140 mg/dL — AB (ref 65–99)
POTASSIUM: 3.8 mmol/L (ref 3.5–5.1)
Sodium: 140 mmol/L (ref 135–145)
TOTAL PROTEIN: 8 g/dL (ref 6.5–8.1)

## 2017-10-09 LAB — I-STAT BETA HCG BLOOD, ED (MC, WL, AP ONLY): I-stat hCG, quantitative: <5 m[IU]/mL (ref <5)

## 2017-10-09 LAB — LIPASE, BLOOD: Lipase: 39 U/L (ref 11–51)

## 2017-10-09 MED ORDER — SODIUM CHLORIDE 0.9 % IV SOLN
8.0000 mg | Freq: Once | INTRAVENOUS | Status: AC
  Administered 2017-10-09: 8 mg via INTRAVENOUS
  Filled 2017-10-09: qty 4

## 2017-10-09 MED ORDER — HALOPERIDOL LACTATE 5 MG/ML IJ SOLN
1.0000 mg | Freq: Once | INTRAMUSCULAR | Status: AC
  Administered 2017-10-09: 1 mg via INTRAVENOUS
  Filled 2017-10-09: qty 1

## 2017-10-09 MED ORDER — IPRATROPIUM-ALBUTEROL 0.5-2.5 (3) MG/3ML IN SOLN
3.0000 mL | Freq: Once | RESPIRATORY_TRACT | Status: DC
  Filled 2017-10-09: qty 3

## 2017-10-09 MED ORDER — SODIUM CHLORIDE 0.9 % IV BOLUS
1000.0000 mL | Freq: Once | INTRAVENOUS | Status: AC
  Administered 2017-10-09: 1000 mL via INTRAVENOUS

## 2017-10-09 MED ORDER — ONDANSETRON HCL 4 MG/2ML IJ SOLN
4.0000 mg | Freq: Once | INTRAMUSCULAR | Status: AC
Start: 2017-10-09 — End: 2017-10-09
  Administered 2017-10-09: 4 mg via INTRAVENOUS
  Filled 2017-10-09: qty 2

## 2017-10-09 MED ORDER — LABETALOL HCL 5 MG/ML IV SOLN
10.0000 mg | Freq: Once | INTRAVENOUS | Status: AC
  Administered 2017-10-09: 10 mg via INTRAVENOUS
  Filled 2017-10-09: qty 4

## 2017-10-09 MED ORDER — LORAZEPAM 2 MG/ML IJ SOLN
1.0000 mg | Freq: Once | INTRAMUSCULAR | Status: AC
  Administered 2017-10-09: 1 mg via INTRAMUSCULAR
  Filled 2017-10-09: qty 1

## 2017-10-09 MED ORDER — METOPROLOL TARTRATE 5 MG/5ML IV SOLN
5.0000 mg | Freq: Once | INTRAVENOUS | Status: AC
  Administered 2017-10-09: 5 mg via INTRAVENOUS
  Filled 2017-10-09: qty 5

## 2017-10-09 NOTE — ED Provider Notes (Addendum)
[facility] DEPT Provider Note   CSN: [id number] Arrival date & time: 10/09/17  1841     History   Chief Complaint Chief Complaint  Patient presents with  . Abdominal Pain    HPI Shirley [name] is a 42 y.o. female with history of diabetes on insulin, hypertension, PE on Eliquis, CVA, gastroparesis, cocaine use here for evaluation of abdominal pain onset 2 days ago.  Pain is severe.  Pain is constant and all over.  Pain feels like pain similar to her gastroparesis flares.  Associated with nausea, nonbilious nonbloody emesis for the last few days.  Also endorsing wet sounding cough and wheezing.  Has not been able to give herself insulin due to nausea and vomiting.  She denies fevers, chills, vision changes, headache, chest pain, shortness of breath, hematemesis, dysuria, hematuria, changes to bowel movements. HPI  Past Medical History:  Diagnosis Date  . Abscess of tunica vaginalis    10/09- Abundant S. aureus- sensitive to all abx  . Anxiety   . Blood dyscrasia   . CAD (coronary artery disease) 06/15/2006   s/p Subendocardial MI with PDA angioplasty(no stent) on 06/15/06 and relook  cath 06/19/06 showed patency of site. Cath 12/10- no restenosis or significant CAD progression  . CVA (cerebral vascular accident) ([place]) ~ 02/2014   denies residual on 04/22/2014  . CVA (cerebral vascular accident) [facility])    history of remote right cerebellar infarct noted on head CT at least since 10/2011  . Depression   . Diabetes mellitus type 2, uncontrolled, with complications ([place])   . Fibromyalgia   . Gastritis   . Gastroparesis    secondary to poorly controlled DM, last emptying study performed 01/2010  was normal but may be falsely positive as pt was on reglan  . GERD (gastroesophageal reflux disease)   . Hepatitis B, chronic (HCC)    Hep BeAb+,Hep B cAb+ & Hep BsAg+ (9/06)  . History of pyelonephritis    H/o GrpB Pyelonephritis (9/06) and UTI- 07/11- E.Coli,  12/10- GBS  . Hyperlipidemia   . Hypertension   . Iron deficiency anemia   . Irregular menses    Small ovarian follicles seen on QI(3/47)  . MI (myocardial infarction) ([place]) 05/2006   PDA percutaneous transluminal coronary angioplasty  . Migraine    "weekly" (04/22/2014)  . N&V (nausea and vomiting)    Chronic. Unclear etiology with multiple admission and ED visits. CT abdomen with and without contrast (02/2011)  showed no acute process. Gastic Emptying scan (01/2010) was normal. Ultrasound of the abdomen was within normal limits. Hepatitis B viral load was undectable. HIV NR. EGD - gastritis, Hpylori + s/p Rx  . Obesity   . OSA (obstructive sleep apnea)    "suppose to wear mask but I don't" (04/22/2014)  . Peripheral neuropathy   . Pneumonia    "this is probably the 2nd or 3rd time I've had pneumonia" (04/22/2014)  . Recurrent boils   . Seasonal asthma   . Substance abuse ([place])   . Thrombocytosis ([place])    Hem/Onc suggested 2/2 chronic hepatits and/or iron deficiency anemia    Patient Active Problem List   Diagnosis Date Noted  . History of pulmonary embolism 10/10/2017  . Chronic anticoagulation 10/10/2017  . GERD (gastroesophageal reflux disease) 10/10/2017  . Leukocytosis 10/10/2017  . Prolonged QT interval 10/10/2017  . Nausea & vomiting 09/20/2017  . Hypertensive urgency 09/20/2017  . Intractable nausea and vomiting 08/08/2017  . Chronic maxillary sinusitis 01/02/2017  .  Acute blood loss anemia 11/13/2016  . Menorrhagia 11/13/2016  . Bilateral pulmonary embolism ([place]) 10/30/2016  . Acute DVT (deep venous thrombosis) ([place]) 10/30/2016  . Nausea and vomiting 09/23/2016  . Type 2 diabetes mellitus with hyperglycemia ([place]) 04/07/2016  . Diabetic gastroparesis ([place]) 04/06/2016  . Dehydration 03/27/2015  . Gastroparesis 11/11/2014  . Hematemesis 10/13/2014  . DKA (diabetic ketoacidoses) ([place]) 10/13/2014  . Abdominal pain, chronic, left lower quadrant   . Diabetic  gastroparesis associated with type 2 diabetes mellitus ([place]) 04/28/2014  . History of Helicobacter pylori infection 04/22/2014  . Vaginal discharge 02/18/2014  . Unspecified constipation 07/21/2013  . Tinea corporis 07/21/2013  . Intractable vomiting 04/29/2013  . Dysmenorrhea 04/22/2013  . UTI (urinary tract infection) 07/15/2012  . Headache(784.0) 02/08/2012  . Health care maintenance 01/22/2012  . Chronic hepatitis B ([place]) 03/07/2011  . History of leukocytosis 04/06/2010  . THROMBOCYTOSIS 04/06/2010  . Polysubstance abuse ([place]) 02/23/2010  . Iron deficiency anemia 11/22/2009  . PERIPHERAL NEUROPATHY 10/01/2009  . Hyperlipidemia 08/30/2009  . Diabetic polyneuropathy ([place]) 08/30/2009  . Hidradenitis (recurrent boils) 07/07/2008  . Depression 12/27/2007  . Abdominal pain, left lower quadrant 11/21/2007  . FIBROMYALGIA 10/30/2007  . BACK PAIN 04/01/2007  . OBSTRUCTIVE SLEEP APNEA 01/17/2007  . ANXIETY DEPRESSION 06/27/2006  . Chronic ischemic heart disease 06/15/2006  . OBESITY, MORBID 05/15/2006  . MIGRAINE HEADACHE 05/15/2006  . Asthma 05/15/2006  . Essential hypertension 01/16/2006  . IRREGULAR MENSTRUATION 01/16/2006  . PEDAL EDEMA 01/16/2006  . Poorly controlled type 2 diabetes mellitus with peripheral neuropathy ([place]) 01/16/1989    Past Surgical History:  Procedure Laterality Date  . CESAREAN SECTION  1997  . CORONARY ANGIOPLASTY WITH STENT PLACEMENT  2008   "2 stents"  . ESOPHAGOGASTRODUODENOSCOPY N/A 04/23/2014   Procedure: ESOPHAGOGASTRODUODENOSCOPY (EGD);  Surgeon: Winfield Cunas., MD;  Location: [facility] ENDOSCOPY;  Service: Endoscopy;  Laterality: N/A;  . IR ANGIOGRAM PELVIS SELECTIVE OR SUPRASELECTIVE  12/07/2016  . IR ANGIOGRAM PELVIS SELECTIVE OR SUPRASELECTIVE  12/07/2016  . IR ANGIOGRAM SELECTIVE EACH ADDITIONAL VESSEL  12/07/2016  . IR ANGIOGRAM SELECTIVE EACH ADDITIONAL VESSEL  12/07/2016  . IR EMBO ARTERIAL NOT HEMORR HEMANG INC GUIDE ROADMAPPING  12/07/2016    . IR RADIOLOGIST EVAL & MGMT  01/09/2017  . IR US GUIDE VASC ACCESS RIGHT  12/07/2016     OB History    Gravida  2   Para  2   Term  2   Preterm      AB      Living        SAB      TAB      Ectopic      Multiple      Live Births  2            Home Medications    Prior to Admission medications   Medication Sig Start Date End Date Taking? Authorizing Provider  albuterol (PROVENTIL HFA;VENTOLIN HFA) 108 (90 Base) MCG/ACT inhaler Inhale 2 puffs into the lungs every 4 (four) hours as needed for wheezing or shortness of breath. 09/27/16  Yes Clent Demark, PA-C  amLODipine (NORVASC) 10 MG tablet Take 1 tablet (10 mg total) by mouth daily. 09/24/17  Yes Patrecia Pour, Christean Grief, MD  apixaban (ELIQUIS) 5 MG TABS tablet Take 1 tablet (5 mg total) by mouth 2 (two) times daily. 08/13/17  Yes Tawny Asal, MD  feeding supplement, GLUCERNA SHAKE, (GLUCERNA SHAKE) LIQD Take 237 mLs by mouth 3 (three) times daily  between meals. 09/04/17  Yes Colbert Ewing, MD  insulin NPH Human (HUMULIN N) 100 UNIT/ML injection Inject 0.12 mLs (12 Units total) into the skin 2 (two) times daily before a meal. 09/04/17  Yes Colbert Ewing, MD  metoCLOPramide (REGLAN) 10 MG tablet Take 1 tablet (10 mg total) by mouth 4 (four) times daily -  before meals and at bedtime. 09/23/17  Yes Patrecia Pour, Christean Grief, MD  metoprolol tartrate (LOPRESSOR) 100 MG tablet Take 1 tablet (100 mg total) by mouth 2 (two) times daily. 08/13/17  Yes Tawny Asal, MD  ondansetron (ZOFRAN) 4 MG tablet Take 1 tablet (4 mg total) by mouth every 6 (six) hours as needed for nausea. 09/23/17  Yes Patrecia Pour, Christean Grief, MD  pantoprazole (PROTONIX) 40 MG tablet Take 1 tablet (40 mg total) by mouth 2 (two) times daily. 09/23/17  Yes Patrecia Pour, Christean Grief, MD  traMADol (ULTRAM) 50 MG tablet Take 1 tablet (50 mg total) by mouth every 12 (twelve) hours as needed. 09/23/17  Yes Patrecia Pour, Christean Grief, MD  glucose blood (FREESTYLE TEST STRIPS) test strip Use as  instructed Patient not taking: Reported on 09/26/2017 09/04/17   Colbert Ewing, MD  glucose monitoring kit (FREESTYLE) monitoring kit 1 each by Does not apply route 4 (four) times daily - after meals and at bedtime. 1 month Diabetic Testing Supplies for QAC-QHS accuchecks. Patient not taking: Reported on 09/26/2017 09/04/17   Colbert Ewing, MD  INSULIN SYRINGE .5CC/28G (INS SYRINGE/NEEDLE .5CC/28G) 28G X 1/2" 0.5 ML MISC Please provide 1 month supply Patient not taking: Reported on 09/26/2017 09/04/17   Colbert Ewing, MD  Lancets (FREESTYLE) lancets Use as instructed Patient not taking: Reported on 09/26/2017 09/04/17   Colbert Ewing, MD    Family History Family History  Problem Relation Age of Onset  . Diabetes Father     Social History Social History   Tobacco Use  . Smoking status: Former Smoker    Types: Cigarettes    Last attempt to quit: 04/24/1996    Years since quitting: 21.5  . Smokeless tobacco: Never Used  . Tobacco comment: quit smoking cigarettes age 64  Substance Use Topics  . Alcohol use: No    Alcohol/week: 0.0 oz    Comment: 04/22/2014 "might have a few drinks a month"  . Drug use: Not Currently    Types: Marijuana, Cocaine    Comment: 04/22/2104 "quit drugs ~ 1-2 yr ago"     Allergies   Lisinopril and Morphine and related   Review of Systems Review of Systems  Respiratory: Positive for cough and wheezing.   Gastrointestinal: Positive for abdominal pain, nausea and vomiting.  Hematological: Bruises/bleeds easily.  All other systems reviewed and are negative.    Physical Exam Updated Vital Signs BP 121/81 (BP Location: Right Arm)   Pulse 92   Temp 98.8 F (37.1 C) (Oral)   Resp 18   Ht _0  (1.702 m)   Wt 97.1 kg (214 lb)   SpO2 100%   BMI 33.52 kg/m   Physical Exam  Constitutional: She is oriented to person, place, and time. She appears well-developed. She appears distressed.  Shaking, writhing in bed. Pt states this helps her cope with her  pain. Does stop for brief periods of time when encouraged to stay still for exam  HENT:  Head: Normocephalic and atraumatic.  Nose: Nose normal.  Moist mucous membranes   Eyes: Pupils are equal, round, and reactive to light. Conjunctivae and EOM are normal.  Neck: Normal range  of motion.  Cardiovascular: Regular rhythm and intact distal pulses. Tachycardia present.  2+ DP and radial pulses bilaterally. No LE edema of calf tenderness  Pulmonary/Chest: Effort normal. She has wheezes.  Expiratory wheezing to upper lobes A/P bilaterally. Speaking in full sentences.    Abdominal: Soft. Bowel sounds are normal. There is generalized tenderness.  Mostly upper and umbilical tenderness. No tenderness to lower quadrants. No G/R/R. No suprapubic or CVA tenderness. Negative Murphy's and McBurney's.   Musculoskeletal: Normal range of motion.  Neurological: She is alert and oriented to person, place, and time.  Skin: Skin is warm. Capillary refill takes less than 2 seconds. She is diaphoretic.  Psychiatric: She has a normal mood and affect. Her behavior is normal.  Nursing note and vitals reviewed.    ED Treatments / Results  Labs (all labs ordered are listed, but only abnormal results are displayed) Labs Reviewed  CBC WITH DIFFERENTIAL/PLATELET - Abnormal; Notable for the following components:      Result Value   WBC 15.3 (*)    MCV 76.1 (*)    RDW 16.0 (*)    Platelets 485 (*)    Neutro Abs 11.9 (*)    All other components within normal limits  COMPREHENSIVE METABOLIC PANEL - Abnormal; Notable for the following components:   Glucose, Bld 140 (*)    Albumin 3.2 (*)    ALT 8 (*)    Total Bilirubin 0.2 (*)    All other components within normal limits  URINALYSIS, ROUTINE W REFLEX MICROSCOPIC - Abnormal; Notable for the following components:   Color, Urine STRAW (*)    Glucose, UA 150 (*)    Ketones, ur 5 (*)    Protein, ur 100 (*)    All other components within normal limits  RAPID URINE  DRUG SCREEN, HOSP PERFORMED - Abnormal; Notable for the following components:   Cocaine POSITIVE (*)    Barbiturates   (*)    Value: Result not available. Reagent lot number recalled by manufacturer.   All other components within normal limits  CBC - Abnormal; Notable for the following components:   WBC 16.1 (*)    Hemoglobin 11.9 (*)    HCT 34.8 (*)    MCV 74.0 (*)    MCH 25.3 (*)    RDW 16.0 (*)    Platelets 472 (*)    All other components within normal limits  BASIC METABOLIC PANEL - Abnormal; Notable for the following components:   Glucose, Bld 212 (*)    All other components within normal limits  MAGNESIUM - Abnormal; Notable for the following components:   Magnesium 1.5 (*)    All other components within normal limits  GLUCOSE, CAPILLARY - Abnormal; Notable for the following components:   Glucose-Capillary 237 (*)    All other components within normal limits  GLUCOSE, CAPILLARY - Abnormal; Notable for the following components:   Glucose-Capillary 234 (*)    All other components within normal limits  GLUCOSE, CAPILLARY - Abnormal; Notable for the following components:   Glucose-Capillary 206 (*)    All other components within normal limits  GLUCOSE, CAPILLARY - Abnormal; Notable for the following components:   Glucose-Capillary 120 (*)    All other components within normal limits  GLUCOSE, CAPILLARY - Abnormal; Notable for the following components:   Glucose-Capillary 147 (*)    All other components within normal limits  CBC - Abnormal; Notable for the following components:   WBC 10.9 (*)    Hemoglobin 11.0 (*)  HCT 32.6 (*)    MCV 76.9 (*)    MCH 25.9 (*)    RDW 16.3 (*)    Platelets 431 (*)    All other components within normal limits  GLUCOSE, CAPILLARY - Abnormal; Notable for the following components:   Glucose-Capillary 160 (*)    All other components within normal limits  GLUCOSE, CAPILLARY - Abnormal; Notable for the following components:    Glucose-Capillary 160 (*)    All other components within normal limits  GLUCOSE, CAPILLARY - Abnormal; Notable for the following components:   Glucose-Capillary 173 (*)    All other components within normal limits  GLUCOSE, CAPILLARY - Abnormal; Notable for the following components:   Glucose-Capillary 159 (*)    All other components within normal limits  GLUCOSE, CAPILLARY - Abnormal; Notable for the following components:   Glucose-Capillary 160 (*)    All other components within normal limits  GLUCOSE, CAPILLARY - Abnormal; Notable for the following components:   Glucose-Capillary 234 (*)    All other components within normal limits  GLUCOSE, CAPILLARY - Abnormal; Notable for the following components:   Glucose-Capillary 254 (*)    All other components within normal limits  URINE CULTURE  LIPASE, BLOOD  GLUCOSE, CAPILLARY  GLUCOSE, CAPILLARY  GLUCOSE, CAPILLARY  I-STAT BETA HCG BLOOD, ED ([facility], [facility], [facility] ONLY)    EKG EKG Interpretation  Date/Time:  Tuesday October 09 2017 20:21:58 EDT Ventricular Rate:  133 PR Interval:    QRS Duration: 108 QT Interval:  343 QTC Calculation: 511 R Axis:   82 Text Interpretation:  Sinus tachycardia Ventricular premature complex Repol abnrm suggests ischemia, inferior leads Minimal ST elevation, anterolateral leads Prolonged QT interval Baseline wander in lead(s) I II III aVR aVL aVF V1 V2 V3 V4 V5 When comapred to prior, similar tachycardia.  No STEMI Confirmed by Antony Blackbird [phone]) on 10/09/2017 10:33:51 PM Also confirmed by Antony Blackbird [phone]), editor Philomena Doheny [phone])  on 10/10/2017 8:00:50 AM   Radiology No results found.  Procedures .Critical Care Performed by: Kinnie Feil, PA-C Authorized by: Kinnie Feil, PA-C   Critical care provider statement:    Critical care time (minutes):  95   Critical care was necessary to treat or prevent imminent or life-threatening deterioration of the following conditions: hypertensive  emergency requiring IV anti hypertensives.   Critical care was time spent personally by me on the following activities:  Evaluation of patient's response to treatment, examination of patient, re-evaluation of patient's condition, review of old charts, ordering and review of radiographic studies, ordering and review of laboratory studies and ordering and performing treatments and interventions   I assumed direction of critical care for this patient from another provider in my specialty: no   Comments:     Multiple attempted US guided IV by me   (including critical care time)  Medications Ordered in ED Medications  sodium chloride 0.9 % bolus 1,000 mL (0 mLs Intravenous Stopped 10/10/17 0106)  ondansetron (ZOFRAN) injection 4 mg (4 mg Intravenous Given 10/09/17 2142)  haloperidol lactate (HALDOL) injection 1 mg (1 mg Intravenous Given 10/09/17 2137)  metoprolol tartrate (LOPRESSOR) injection 5 mg (5 mg Intravenous Given 10/09/17 2236)  LORazepam (ATIVAN) injection 1 mg (1 mg Intramuscular Given 10/09/17 2239)  ondansetron (ZOFRAN) 8 mg in sodium chloride 0.9 % 50 mL IVPB (0 mg Intravenous Stopped 10/09/17 2323)  labetalol (NORMODYNE,TRANDATE) injection 10 mg (10 mg Intravenous Given 10/09/17 2336)  hydrALAZINE (APRESOLINE) injection 10 mg (10 mg Intravenous  Given 10/10/17 0116)  sodium chloride 0.9 % bolus 1,000 mL (1,000 mLs Intravenous New Bag/Given 10/10/17 0238)  promethazine (PHENERGAN) injection 12.5 mg (12.5 mg Intravenous Given 10/10/17 0235)  dicyclomine (BENTYL) injection 20 mg (20 mg Intramuscular Given 10/10/17 0234)  ketorolac (TORADOL) 30 MG/ML injection 30 mg (30 mg Intravenous Given 10/10/17 0233)  enoxaparin (LOVENOX) injection 100 mg (100 mg Subcutaneous Given 10/10/17 0411)  ketorolac (TORADOL) 30 MG/ML injection 30 mg (30 mg Intravenous Given 10/10/17 0506)     Initial Impression / Assessment and Plan / ED Course  I have reviewed the triage vital signs and the nursing  notes.  Pertinent labs & imaging results that were available during my care of the patient were reviewed by me and considered in my medical decision making (see chart for details).  Clinical Course as of Oct 25 1016  Tue Oct 09, 2017  2229 Re-evaluated patient after zofran 4 mg and haldol 1 mg states no improvement in abdominal pain or nausea.    [CG]  2318 Re-evaluated pt. She is asleep but easily arousable. Rechecked BP 202/128.    [CG]    Clinical Course User Index [CG] Kinnie Feil, PA-C   ddx includes DKA vs gastroparesis. Other acute etiologies pancreatitis, cholecystitis, appendicitis lower on differential. All of these in setting of insulin non compliance and poorly controlled HTN.    Unfortunately, delay in IV access. Unsuccessful IV guided [place] attempts by me and RNs. Pt is shaking and writhing in bed during attempts.  Final Clinical Impressions(s) / ED Diagnoses   Pt will be handed off to oncoming EDPA pending remaining labs. At shift change labs remarkable for leukocytosis, glucosuria, proteinuria and mild ketonuria. No signs of infection in CXR or UA. She remains hypertensive despite lopressor, antiemetics, haldol, ativan and IVF.  On my last exam pt was asleep, BP > 200/120.  Anticipate reassessment.  Consider admission for intractable nausea and vomiting and hypertensive urgency if symptoms do not improve in ER.  Final diagnoses:  Intractable cyclical vomiting with nausea  Hypertensive urgency  Cocaine abuse [facility])    ED Discharge Orders        Ordered    Increase activity slowly     10/13/17 1206    Diet - low sodium heart healthy     10/13/17 1206    Call MD for:  temperature >100.4     10/13/17 1206    Call MD for:  severe uncontrolled pain     10/13/17 1206    Discharge instructions    Comments:  Please be sure to follow up with your primary care physician in the next 1-2 weeks or sooner should any new concern arise.   10/13/17 [place],  Daqwan Dougal J, PA-C 10/10/17 0053    T[place], Gwenyth Allegra, MD 10/10/17 0120    Kinnie Feil, PA-C 10/25/17 1019    Tegeler, Gwenyth Allegra, MD 10/25/17 1152

## 2017-10-09 NOTE — ED Triage Notes (Signed)
Pt arrived via [facility] due to abdominal pain, nausea and vomiting over the last two days. Pt reports she has a hx of gastroparesis and this feels like one of her flare ups. Pt writhing on stretcher in pain.

## 2017-10-09 NOTE — ED Notes (Signed)
Vital sins documented on wrong pt @ 1904

## 2017-10-10 ENCOUNTER — Other Ambulatory Visit: Payer: Self-pay

## 2017-10-10 DIAGNOSIS — E1143 Type 2 diabetes mellitus with diabetic autonomic (poly)neuropathy: Secondary | ICD-10-CM

## 2017-10-10 DIAGNOSIS — K3184 Gastroparesis: Secondary | ICD-10-CM

## 2017-10-10 DIAGNOSIS — F1410 Cocaine abuse, uncomplicated: Secondary | ICD-10-CM

## 2017-10-10 DIAGNOSIS — Z86711 Personal history of pulmonary embolism: Secondary | ICD-10-CM

## 2017-10-10 DIAGNOSIS — Z7901 Long term (current) use of anticoagulants: Secondary | ICD-10-CM

## 2017-10-10 DIAGNOSIS — I160 Hypertensive urgency: Secondary | ICD-10-CM

## 2017-10-10 DIAGNOSIS — K219 Gastro-esophageal reflux disease without esophagitis: Secondary | ICD-10-CM | POA: Diagnosis present

## 2017-10-10 DIAGNOSIS — R9431 Abnormal electrocardiogram [ECG] [EKG]: Secondary | ICD-10-CM

## 2017-10-10 DIAGNOSIS — D72829 Elevated white blood cell count, unspecified: Secondary | ICD-10-CM

## 2017-10-10 DIAGNOSIS — G43A1 Cyclical vomiting, intractable: Secondary | ICD-10-CM

## 2017-10-10 LAB — BASIC METABOLIC PANEL
Anion gap: 10 (ref 5–15)
BUN: 13 mg/dL (ref 6–20)
CO2: 24 mmol/L (ref 22–32)
Calcium: 8.9 mg/dL (ref 8.9–10.3)
Chloride: 105 mmol/L (ref 101–111)
Creatinine, Ser: 0.92 mg/dL (ref 0.44–1.00)
GFR calc Af Amer: >60 mL/min (ref >60)
GLUCOSE: 212 mg/dL — AB (ref 65–99)
Potassium: 3.6 mmol/L (ref 3.5–5.1)
SODIUM: 139 mmol/L (ref 135–145)

## 2017-10-10 LAB — RAPID URINE DRUG SCREEN, HOSP PERFORMED
AMPHETAMINES: NOT DETECTED
Benzodiazepines: NOT DETECTED
COCAINE: POSITIVE — AB
OPIATES: NOT DETECTED
TETRAHYDROCANNABINOL: NOT DETECTED

## 2017-10-10 LAB — CBC
HCT: 34.8 % — ABNORMAL LOW (ref 36.0–46.0)
Hemoglobin: 11.9 g/dL — ABNORMAL LOW (ref 12.0–15.0)
MCH: 25.3 pg — AB (ref 26.0–34.0)
MCHC: 34.2 g/dL (ref 30.0–36.0)
MCV: 74 fL — AB (ref 78.0–100.0)
PLATELETS: 472 10*3/uL — AB (ref 150–400)
RBC: 4.7 MIL/uL (ref 3.87–5.11)
RDW: 16 % — AB (ref 11.5–15.5)
WBC: 16.1 10*3/uL — AB (ref 4.0–10.5)

## 2017-10-10 LAB — GLUCOSE, CAPILLARY
Glucose-Capillary: 237 mg/dL — ABNORMAL HIGH (ref 65–99)
Glucose-Capillary: 234 mg/dL — ABNORMAL HIGH (ref 65–99)
Glucose-Capillary: 206 mg/dL — ABNORMAL HIGH (ref 65–99)
Glucose-Capillary: 120 mg/dL — ABNORMAL HIGH (ref 65–99)

## 2017-10-10 LAB — MAGNESIUM: MAGNESIUM: 1.5 mg/dL — AB (ref 1.7–2.4)

## 2017-10-10 MED ORDER — METOPROLOL TARTRATE 50 MG PO TABS
100.0000 mg | ORAL_TABLET | Freq: Two times a day (BID) | ORAL | Status: DC
  Administered 2017-10-10 – 2017-10-13 (×7): 100 mg via ORAL
  Filled 2017-10-10 (×7): qty 2

## 2017-10-10 MED ORDER — HYDRALAZINE HCL 20 MG/ML IJ SOLN
10.0000 mg | INTRAMUSCULAR | Status: AC
  Administered 2017-10-10: 10 mg via INTRAVENOUS
  Filled 2017-10-10: qty 1

## 2017-10-10 MED ORDER — SODIUM CHLORIDE 0.9 % IV BOLUS
1000.0000 mL | Freq: Once | INTRAVENOUS | Status: AC
  Administered 2017-10-10: 1000 mL via INTRAVENOUS

## 2017-10-10 MED ORDER — PROMETHAZINE HCL 25 MG/ML IJ SOLN
12.5000 mg | Freq: Once | INTRAMUSCULAR | Status: AC
  Administered 2017-10-10: 12.5 mg via INTRAVENOUS
  Filled 2017-10-10: qty 1

## 2017-10-10 MED ORDER — KETOROLAC TROMETHAMINE 30 MG/ML IJ SOLN
30.0000 mg | Freq: Once | INTRAMUSCULAR | Status: AC
  Administered 2017-10-10: 30 mg via INTRAVENOUS
  Filled 2017-10-10: qty 1

## 2017-10-10 MED ORDER — INSULIN ASPART 100 UNIT/ML ~~LOC~~ SOLN
0.0000 [IU] | Freq: Every day | SUBCUTANEOUS | Status: DC
  Administered 2017-10-12: 2 [IU] via SUBCUTANEOUS

## 2017-10-10 MED ORDER — METOCLOPRAMIDE HCL 5 MG/ML IJ SOLN
5.0000 mg | Freq: Four times a day (QID) | INTRAMUSCULAR | Status: DC | PRN
  Administered 2017-10-11 – 2017-10-12 (×2): 5 mg via INTRAVENOUS
  Filled 2017-10-10 (×2): qty 2

## 2017-10-10 MED ORDER — SODIUM CHLORIDE 0.9 % IV SOLN
INTRAVENOUS | Status: DC
  Administered 2017-10-10 – 2017-10-13 (×7): via INTRAVENOUS

## 2017-10-10 MED ORDER — AMLODIPINE BESYLATE 10 MG PO TABS
10.0000 mg | ORAL_TABLET | Freq: Every day | ORAL | Status: DC
  Administered 2017-10-10 – 2017-10-13 (×4): 10 mg via ORAL
  Filled 2017-10-10 (×4): qty 1

## 2017-10-10 MED ORDER — ENOXAPARIN SODIUM 100 MG/ML ~~LOC~~ SOLN
100.0000 mg | Freq: Two times a day (BID) | SUBCUTANEOUS | Status: DC
  Administered 2017-10-10 – 2017-10-12 (×5): 100 mg via SUBCUTANEOUS
  Filled 2017-10-10 (×5): qty 1

## 2017-10-10 MED ORDER — ACETAMINOPHEN 325 MG PO TABS
650.0000 mg | ORAL_TABLET | Freq: Four times a day (QID) | ORAL | Status: DC | PRN
  Administered 2017-10-11 (×2): 650 mg via ORAL
  Filled 2017-10-10 (×2): qty 2

## 2017-10-10 MED ORDER — ACETAMINOPHEN 650 MG RE SUPP: 650.0000 mg | Freq: Four times a day (QID) | RECTAL | Status: DC | PRN

## 2017-10-10 MED ORDER — DICYCLOMINE HCL 10 MG/ML IM SOLN
20.0000 mg | Freq: Once | INTRAMUSCULAR | Status: AC
  Administered 2017-10-10: 20 mg via INTRAMUSCULAR
  Filled 2017-10-10: qty 2

## 2017-10-10 MED ORDER — INSULIN ASPART 100 UNIT/ML ~~LOC~~ SOLN
0.0000 [IU] | Freq: Three times a day (TID) | SUBCUTANEOUS | Status: DC
  Administered 2017-10-10: 5 [IU] via SUBCUTANEOUS
  Administered 2017-10-11: 2 [IU] via SUBCUTANEOUS
  Administered 2017-10-11 – 2017-10-12 (×4): 3 [IU] via SUBCUTANEOUS
  Administered 2017-10-13: 8 [IU] via SUBCUTANEOUS

## 2017-10-10 MED ORDER — HYDRALAZINE HCL 20 MG/ML IJ SOLN
10.0000 mg | INTRAMUSCULAR | Status: DC | PRN
  Administered 2017-10-10: 10 mg via INTRAVENOUS
  Filled 2017-10-10: qty 1

## 2017-10-10 MED ORDER — ENOXAPARIN SODIUM 100 MG/ML ~~LOC~~ SOLN
100.0000 mg | SUBCUTANEOUS | Status: AC
  Administered 2017-10-10: 100 mg via SUBCUTANEOUS
  Filled 2017-10-10 (×2): qty 1

## 2017-10-10 NOTE — Progress Notes (Signed)
The patient was seen and examined earlier this a.m.  Please refer to H&P for details regarding assessment and plan.  EGD shows QT interval of 471  Patient still complaining of abdominal discomfort and poor oral intake.  General: Patient no acute distress Cardiovascular: No cyanosis Pulmonary: No increased work of breathing, no wheezes  Current therapy will reassess next a.m.  Velvet Bathe, MD

## 2017-10-10 NOTE — Progress Notes (Signed)
Unable to obtain EKG and apply cardiac monitor. Patient not able to stay still in  Bed.

## 2017-10-10 NOTE — H&P (Signed)
History and Physical    [name] XBL:[id number] DOB: 1976-01-08 DOA: 10/09/2017  Referring MD/NP/PA: Antonietta Breach, PA-C PCP: Shirley Demark, PA-C  Patient coming from: Home  Chief Complaint: Abdominal pain  I have personally briefly reviewed patient's old medical records in [place]   HPI: Shirley [name] is a 42 y.o. female with medical history significant of DM type II w/ comlications of gastroparesis, PE on Elq[place], and cocaine use; who presents with complaints of generalized abdominal pain over the last 2 days.  History is somewhat difficult to obtain due to patient condition.  Associated symptoms include nausea, vomiting, cough, and intermittent wheezing.  Emesis is noted to be nonbloody and nonbilious in appearance.  Denies any significant fever, focal weakness, headache, chest pain, shortness of breath, dysuria, or urinary frequency.  Patient admits to using cocaine in the last 2 days and has not been able to keep her medications down due to symptoms.    ED Course: Upon admission into the emergency department patient was noted to be afebrile, heart rates 97-128, respiration 18-25, blood pressures elevated up to 05/30/1999 18, and O2 saturation maintained on room air.  Labs revealed WBC 15.3, platelets 485, and lipase 39.  UDS was positive for cocaine.  Patient was given 1 L of normal saline IV fluids, Zofran, Phenergan, Ativan, Bentyl, and Haldol without relief of symptoms.  Blood pressures were treated with IV labetalol, hydralazine, and metoprolol.  TRH called to admit due to persistence of symptoms.  Review of Systems  Constitutional: Positive for malaise/fatigue. Negative for chills and fever.  HENT: Negative for hearing loss and sinus pain.   Eyes: Negative for double vision.  Respiratory: Positive for cough. Negative for shortness of breath.   Cardiovascular: Negative for chest pain and leg swelling.  Gastrointestinal: Positive for abdominal pain, nausea and  vomiting. Negative for diarrhea.  Genitourinary: Negative for dysuria and hematuria.  Neurological: Negative for focal weakness and loss of consciousness.  Endo/Heme/Allergies: Does not bruise/bleed easily.  Psychiatric/Behavioral: Positive for substance abuse. Negative for suicidal ideas.    Past Medical History:  Diagnosis Date  . Abscess of tunica vaginalis    10/09- Abundant S. aureus- sensitive to all abx  . Anxiety   . Blood dyscrasia   . CAD (coronary artery disease) 06/15/2006   s/p Subendocardial MI with PDA angioplasty(no stent) on 06/15/06 and relook  cath 06/19/06 showed patency of site. Cath 12/10- no restenosis or significant CAD progression  . CVA (cerebral vascular accident) ([place]) ~ 02/2014   denies residual on 04/22/2014  . CVA (cerebral vascular accident) [facility])    history of remote right cerebellar infarct noted on head CT at least since 10/2011  . Depression   . Diabetes mellitus type 2, uncontrolled, with complications ([place])   . Fibromyalgia   . Gastritis   . Gastroparesis    secondary to poorly controlled DM, last emptying study performed 01/2010  was normal but may be falsely positive as pt was on reglan  . GERD (gastroesophageal reflux disease)   . Hepatitis B, chronic (HCC)    Hep BeAb+,Hep B cAb+ & Hep BsAg+ (9/06)  . History of pyelonephritis    H/o GrpB Pyelonephritis (9/06) and UTI- 07/11- E.Coli, 12/10- GBS  . Hyperlipidemia   . Hypertension   . Iron deficiency anemia   . Irregular menses    Small ovarian follicles seen on RA(0/76)  . MI (myocardial infarction) ([place]) 05/2006   PDA percutaneous transluminal coronary angioplasty  .  Migraine    "weekly" (04/22/2014)  . N&V (nausea and vomiting)    Chronic. Unclear etiology with multiple admission and ED visits. CT abdomen with and without contrast (02/2011)  showed no acute process. Gastic Emptying scan (01/2010) was normal. Ultrasound of the abdomen was within normal limits. Hepatitis B viral load was  undectable. HIV NR. EGD - gastritis, Hpylori + s/p Rx  . Obesity   . OSA (obstructive sleep apnea)    "suppose to wear mask but I don't" (04/22/2014)  . Peripheral neuropathy   . Pneumonia    "this is probably the 2nd or 3rd time I've had pneumonia" (04/22/2014)  . Recurrent boils   . Seasonal asthma   . Substance abuse ([place])   . Thrombocytosis ([place])    Hem/Onc suggested 2/2 chronic hepatits and/or iron deficiency anemia    Past Surgical History:  Procedure Laterality Date  . CESAREAN SECTION  1997  . CORONARY ANGIOPLASTY WITH STENT PLACEMENT  2008   "2 stents"  . ESOPHAGOGASTRODUODENOSCOPY N/A 04/23/2014   Procedure: ESOPHAGOGASTRODUODENOSCOPY (EGD);  Surgeon: Shirley [name]., MD;  Location: [facility] ENDOSCOPY;  Service: Endoscopy;  Laterality: N/A;  . IR ANGIOGRAM PELVIS SELECTIVE OR SUPRASELECTIVE  12/07/2016  . IR ANGIOGRAM PELVIS SELECTIVE OR SUPRASELECTIVE  12/07/2016  . IR ANGIOGRAM SELECTIVE EACH ADDITIONAL VESSEL  12/07/2016  . IR ANGIOGRAM SELECTIVE EACH ADDITIONAL VESSEL  12/07/2016  . IR EMBO ARTERIAL NOT HEMORR HEMANG INC GUIDE ROADMAPPING  12/07/2016  . IR RADIOLOGIST EVAL & MGMT  01/09/2017  . IR US GUIDE VASC ACCESS RIGHT  12/07/2016     reports that she quit smoking about 21 years ago. Her smoking use included cigarettes. She has never used smokeless tobacco. She reports that she has current or past drug history. Drugs: Marijuana and Cocaine. She reports that she does not drink alcohol.  Allergies  Allergen Reactions  . Lisinopril Nausea Only and Swelling  . Morphine And Related Itching and Swelling    Family History  Problem Relation Age of Onset  . Diabetes Father     Prior to Admission medications   Medication Sig Start Date End Date Taking? Authorizing Provider  albuterol (PROVENTIL HFA;VENTOLIN HFA) 108 (90 Base) MCG/ACT inhaler Inhale 2 puffs into the lungs every 4 (four) hours as needed for wheezing or shortness of breath. 09/27/16  Yes Shirley Demark,  PA-C  amLODipine (NORVASC) 10 MG tablet Take 1 tablet (10 mg total) by mouth daily. 09/24/17  Yes Shirley [name], Shirley Grief, MD  apixaban (ELIQUIS) 5 MG TABS tablet Take 1 tablet (5 mg total) by mouth 2 (two) times daily. 08/13/17  Yes Tawny Asal, MD  feeding supplement, GLUCERNA SHAKE, (GLUCERNA SHAKE) LIQD Take 237 mLs by mouth 3 (three) times daily between meals. 09/04/17  Yes Colbert Ewing, MD  insulin NPH Human (HUMULIN N) 100 UNIT/ML injection Inject 0.12 mLs (12 Units total) into the skin 2 (two) times daily before a meal. 09/04/17  Yes Colbert Ewing, MD  metoCLOPramide (REGLAN) 10 MG tablet Take 1 tablet (10 mg total) by mouth 4 (four) times daily -  before meals and at bedtime. 09/23/17  Yes Shirley [name], Shirley Grief, MD  metoprolol tartrate (LOPRESSOR) 100 MG tablet Take 1 tablet (100 mg total) by mouth 2 (two) times daily. 08/13/17  Yes Tawny Asal, MD  ondansetron (ZOFRAN) 4 MG tablet Take 1 tablet (4 mg total) by mouth every 6 (six) hours as needed for nausea. 09/23/17  Yes Shirley [name], Shirley Grief, MD  pantoprazole (PROTONIX) 40 MG  tablet Take 1 tablet (40 mg total) by mouth 2 (two) times daily. 09/23/17  Yes Shirley [name], Shirley Grief, MD  traMADol (ULTRAM) 50 MG tablet Take 1 tablet (50 mg total) by mouth every 12 (twelve) hours as needed. 09/23/17  Yes Shirley [name], Shirley Grief, MD  glucose blood (FREESTYLE TEST STRIPS) test strip Use as instructed Patient not taking: Reported on 09/26/2017 09/04/17   Colbert Ewing, MD  glucose monitoring kit (FREESTYLE) monitoring kit 1 each by Does not apply route 4 (four) times daily - after meals and at bedtime. 1 month Diabetic Testing Supplies for QAC-QHS accuchecks. Patient not taking: Reported on 09/26/2017 09/04/17   Colbert Ewing, MD  INSULIN SYRINGE .5CC/28G (INS SYRINGE/NEEDLE .5CC/28G) 28G X 1/2" 0.5 ML MISC Please provide 1 month supply Patient not taking: Reported on 09/26/2017 09/04/17   Colbert Ewing, MD  Lancets (FREESTYLE) lancets Use as instructed Patient not taking:  Reported on 09/26/2017 09/04/17   Colbert Ewing, MD    Physical Exam:  Constitutional: Disheveled female who appears to be sleeping unless, bothered at which time she begins shaking her whole body in bed. Vitals:   10/09/17 2330 10/10/17 0000 10/10/17 0030 10/10/17 0130  BP: (!) 197/173 (!) 188/152 (!) 193/175 (!) 176/101  Pulse: (!) 111 (!) 106 (!) 111 (!) 117  Resp:  _0 Temp:      TempSrc:      SpO2: 97% 96% 97% 99%  Weight:      Height:       Eyes: PERRL, lids and conjunctivae normal ENMT: Mucous membranes are moist. Posterior pharynx clear of any exudate or lesions.  Neck: normal, supple, no masses, no thyromegaly Respiratory: Clear to auscultation bilaterally with no wheezes noted at this time. Cardiovascular: Tachycardic, no murmurs / rubs / gallops. No extremity edema. 2+ pedal pulses. No carotid bruits.  Abdomen: Generalized tenderness, no masses palpated. No hepatosplenomegaly. Bowel sounds positive.  Musculoskeletal: no clubbing / cyanosis. No joint deformity upper and lower extremities. Good ROM, no contractures. Normal muscle tone.  Skin: no rashes, lesions, ulcers. No induration Neurologic: CN 2-12 grossly intact. Sensation intact, DTR normal. Strength 5/5 in all 4.  Psychiatric: Abnormal behavior with poor insight and judgment.   Labs on Admission: I have personally reviewed following labs and imaging studies  CBC: Recent Labs  Lab 10/09/17 2156  WBC 15.3*  NEUTROABS 11.9*  HGB 12.3  HCT 36.0  MCV 76.1*  PLT 308*   Basic Metabolic Panel: Recent Labs  Lab 10/09/17 2156  NA 140  K 3.8  CL 104  CO2 26  GLUCOSE 140*  BUN 13  CREATININE 0.97  CALCIUM 9.3   GFR: Estimated Creatinine Clearance: 88.6 mL/min (by C-G formula based on SCr of 0.97 mg/dL). Liver Function Tests: Recent Labs  Lab 10/09/17 2156  AST 19  ALT 8*  ALKPHOS 67  BILITOT 0.2*  PROT 8.0  ALBUMIN 3.2*   Recent Labs  Lab 10/09/17 2156  LIPASE 39   No results for  input(s): AMMONIA in the last 168 hours. Coagulation Profile: No results for input(s): INR, PROTIME in the last 168 hours. Cardiac Enzymes: No results for input(s): CKTOTAL, CKMB, CKMBINDEX, TROPONINI in the last 168 hours. BNP (last 3 results) No results for input(s): PROBNP in the last 8760 hours. HbA1C: No results for input(s): HGBA1C in the last 72 hours. CBG: No results for input(s): GLUCAP in the last 168 hours. Lipid Profile: No results for input(s): CHOL, HDL, LDLCALC, TRIG, CHOLHDL, LDLDIRECT in the  last 72 hours. Thyroid Function Tests: No results for input(s): TSH, T4TOTAL, FREET4, T3FREE, THYROIDAB in the last 72 hours. Anemia Panel: No results for input(s): VITAMINB12, FOLATE, FERRITIN, TIBC, IRON, RETICCTPCT in the last 72 hours. Urine analysis:    Component Value Date/Time   COLORURINE STRAW (A) 10/09/2017 2156   APPEARANCEUR CLEAR 10/09/2017 2156   LABSPEC 1.011 10/09/2017 2156   PHURINE 8.0 10/09/2017 2156   GLUCOSEU 150 (A) 10/09/2017 2156   GLUCOSEU > 1000 mg/dL (A) 07/07/2008 2115   HGBUR NEGATIVE 10/09/2017 2156   HGBUR negative 08/12/2008 1444   BILIRUBINUR NEGATIVE 10/09/2017 2156   BILIRUBINUR neg 02/07/2017 1609   KETONESUR 5 (A) 10/09/2017 2156   PROTEINUR 100 (A) 10/09/2017 2156   UROBILINOGEN 1.0 02/07/2017 1609   UROBILINOGEN 0.2 11/11/2014 1658   NITRITE NEGATIVE 10/09/2017 2156   LEUKOCYTESUR NEGATIVE 10/09/2017 2156   Sepsis Labs: No results found for this or any previous visit (from the past 240 hour(s)).   Radiological Exams on Admission: Dg Chest 2 View  Result Date: 10/09/2017 CLINICAL DATA:  Abdominal pain, nausea and vomiting for 2 days. EXAM: CHEST - 2 VIEW COMPARISON:  PA and lateral chest CT 11/28/2016.  Chest 11/07/2016. FINDINGS: Lungs are clear. Heart size is normal. No pneumothorax or pleural effusion. No focal bony abnormality. IMPRESSION: Negative chest. Electronically Signed   By: Inge Rise M.D.   On: 10/09/2017 21:39      EKG: Independently reviewed.  Sinus tachycardia 133 bpm with prolonged QTc of 511  Assessment/Plan Intractable nausea and vomiting, abdominal pain: Acute on chronic.  Patient with multiple ER visits and hospitalizations for the same.  Suspect symptoms related with recent cocaine use and/or history of gastroparesis.  - Admit to a telemetry bed - IV fluids normal saline at 100 mL/h - Holding antiemetics due to prolonged QT  Hypertensive urgency: Acute.  Systolic blood pressures elevated into the 200s on admission. - Continue amlodipine and metoprolol - Hydralazine IV as needed  Leukocytosis: WBC elevated at 15.8 on admission.  Chest x-ray otherwise noted to be clear and urinalysis negative for any signs of infection. Suspect symptoms may likely be noninfectious. - CBC in a.m.  Prolonged QT interval: QTC noted to be prolonged at 511. - Correct any electrolyte abnormalities - Avoid QT prolonging medications at this time - Recheck QTC in a.m.  Diabetes mellitus type 2: Uncontrolled last hemoglobin A1c noted to be 10.2 on 08/08/2017. - Hypoglycemic protocol - Restart home insulin regimen once tolerating p.o. - CBGs q. before meals and at bedtime  Polysubstance abuse(cocaine): Patient reports after using cocaine approximately 2 days ago. - Counseled on need of cessation of cocaine use  History of PE on chronic anticoagulation: It has been unable to take Eliquis due to nausea vomiting symptoms. - Lovenox treatment dose per pharmacy  - Restart Eliquis when able  GERD - Restart Protonix once medically appropriate   DVT prophylaxis: Lovenox treatment dose Code Status: full Family Communication: No family present at bedside Disposition Plan: Likely discharge home once medically stable Consults called: none  Admission status: observation  Norval Morton MD [facility] Pager [phone]   If 7PM-7AM, please contact night-coverage [email or web address] Password  [email or web address]  10/10/2017, 1:55 AM

## 2017-10-10 NOTE — ED Notes (Signed)
ED TO INPATIENT HANDOFF REPORT  Name/Age/Gender Shirley [name] 42 y.o. female  Code Status    Code Status Orders  (From admission, onward)        Start     Ordered   10/10/17 0212  Full code  Continuous     10/10/17 0218    Code Status History    Date Active Date Inactive Code Status Order ID Comments User Context   09/20/2017 0618 09/23/2017 1848 Full Code [id number]  Rise Patience, MD ED   09/02/2017 0538 09/04/2017 2154 Full Code [id number]  Alphonzo Grieve, MD ED   08/08/2017 1502 08/13/2017 2100 Full Code [id number]  Shela Leff, MD ED   12/02/2016 1611 12/11/2016 2233 Full Code [id number]  Jule Ser, DO Inpatient   11/11/2016 2148 11/19/2016 0043 Full Code [id number]  Valinda Party, DO Inpatient   11/07/2016 0942 11/08/2016 1259 Full Code [id number]  Jule Ser, DO Inpatient   10/30/2016 1633 10/31/2016 2220 Full Code [id number]  Jule Ser, [place] [facility]   09/09/2016 0817 09/13/2016 1740 Full Code [id number]  Rondel Jumbo, PA-C ED   08/11/2016 0303 08/14/2016 1732 Full Code [id number]  Sid Falcon, MD Inpatient   04/07/2016 0108 04/07/2016 1423 Full Code [id number]  Rise Patience, MD ED   03/17/2016 2135 03/21/2016 0057 Full Code [id number]  Norval Morton, MD ED   02/05/2016 0813 02/07/2016 1854 Full Code [id number]  Collier Salina, MD Inpatient   01/20/2016 0554 01/25/2016 2335 Full Code [id number]  Zada Finders, MD ED   11/28/2015 2330 11/29/2015 2049 Full Code [id number]  Juliet Rude, MD Inpatient   04/01/2015 1705 04/02/2015 1904 Full Code [id number]  Loleta Chance, MD Inpatient   03/27/2015 0240 03/28/2015 1923 Full Code [id number]  Norman Herrlich, MD Inpatient   11/11/2014 1642 11/15/2014 2036 Full Code [id number]  Bethena Roys, MD Inpatient   10/13/2014 0443 10/15/2014 2252 Full Code [id number]  Bethena Roys, MD ED   07/21/2014 0259 07/22/2014 1635 Full Code [id number]  Otho Bellows, MD Inpatient   04/22/2014 0130 04/26/2014 2047 Full  Code [id number]  Juluis Mire, MD ED   04/03/2014 1702 04/04/2014 2059 Full Code [id number]  Francesca Oman, DO ED   07/22/2013 1243 07/25/2013 1906 Full Code [id number]  Hester Mates, MD ED   04/29/2013 2046 05/02/2013 0152 Full Code [id number]  Theodis Blaze, MD Inpatient   04/29/2013 2039 04/29/2013 2046 Full Code [id number]  Allie Bossier, MD Inpatient   04/13/2013 0444 04/15/2013 2314 Full Code [id number]  Charlann Lange, MD Inpatient   06/20/2012 0155 06/21/2012 2002 Full Code [id number]  Annamarie Dawley, DO Inpatient   02/11/2012 0540 02/11/2012 1733 Full Code [id number]  Remer Macho, [place] ED   03/30/2011 1922 04/01/2011 1747 Full Code [id number]  Ronnette Hila, RN Inpatient   03/30/2011 1829 03/30/2011 1922 Full Code [id number]  Ronnette Hila, RN Inpatient      Home/SNF/Other Home  Chief Complaint Abdominal Pain; NV  Level of Care/Admitting Diagnosis ED Disposition    ED Disposition Condition [facility] Area: [facility] [[id number]]  Level of Care: Telemetry [5]  Admit to tele based on following criteria: Complex arrhythmia (Bradycardia/Tachycardia)  Diagnosis: Intractable nausea and vomiting [620355]  Admitting Physician: Norval Morton [[id number]]  Attending Physician: Norval Morton [[id number]]  PT Class (Do Not Modify): Observation [104]  PT Acc Code (Do Not Modify): Observation [10022]  Medical History Past Medical History:  Diagnosis Date  . Abscess of tunica vaginalis    10/09- Abundant S. aureus- sensitive to all abx  . Anxiety   . Blood dyscrasia   . CAD (coronary artery disease) 06/15/2006   s/p Subendocardial MI with PDA angioplasty(no stent) on 06/15/06 and relook  cath 06/19/06 showed patency of site. Cath 12/10- no restenosis or significant CAD progression  . CVA (cerebral vascular accident) ([place]) ~ 02/2014   denies residual on 04/22/2014  . CVA (cerebral vascular accident) [facility])    history of remote right  cerebellar infarct noted on head CT at least since 10/2011  . Depression   . Diabetes mellitus type 2, uncontrolled, with complications ([place])   . Fibromyalgia   . Gastritis   . Gastroparesis    secondary to poorly controlled DM, last emptying study performed 01/2010  was normal but may be falsely positive as pt was on reglan  . GERD (gastroesophageal reflux disease)   . Hepatitis B, chronic (HCC)    Hep BeAb+,Hep B cAb+ & Hep BsAg+ (9/06)  . History of pyelonephritis    H/o GrpB Pyelonephritis (9/06) and UTI- 07/11- E.Coli, 12/10- GBS  . Hyperlipidemia   . Hypertension   . Iron deficiency anemia   . Irregular menses    Small ovarian follicles seen on FW(2/63)  . MI (myocardial infarction) ([place]) 05/2006   PDA percutaneous transluminal coronary angioplasty  . Migraine    "weekly" (04/22/2014)  . N&V (nausea and vomiting)    Chronic. Unclear etiology with multiple admission and ED visits. CT abdomen with and without contrast (02/2011)  showed no acute process. Gastic Emptying scan (01/2010) was normal. Ultrasound of the abdomen was within normal limits. Hepatitis B viral load was undectable. HIV NR. EGD - gastritis, Hpylori + s/p Rx  . Obesity   . OSA (obstructive sleep apnea)    "suppose to wear mask but I don't" (04/22/2014)  . Peripheral neuropathy   . Pneumonia    "this is probably the 2nd or 3rd time I've had pneumonia" (04/22/2014)  . Recurrent boils   . Seasonal asthma   . Substance abuse ([place])   . Thrombocytosis (HCC)    Hem/Onc suggested 2/2 chronic hepatits and/or iron deficiency anemia    Allergies Allergies  Allergen Reactions  . Lisinopril Nausea Only and Swelling  . Morphine And Related Itching and Swelling    IV Location/Drains/Wounds Patient Lines/Drains/Airways Status   Active Line/Drains/Airways    Name:   Placement date:   Placement time:   Site:   Days:   Peripheral IV 10/09/17 Left;Anterior Forearm   10/09/17    2114    Forearm   1   Incision (Closed)  12/07/16 Groin Right   12/07/16    1137     307          Labs/Imaging Results for orders placed or performed during the hospital encounter of 10/09/17 (from the past 48 hour(s))  CBC with Differential     Status: Abnormal   Collection Time: 10/09/17  9:56 PM  Result Value Ref Range   WBC 15.3 (H) 4.0 - 10.5 K/uL   RBC 4.73 3.87 - 5.11 MIL/uL   Hemoglobin 12.3 12.0 - 15.0 g/dL   HCT 36.0 36.0 - 46.0 %   MCV 76.1 (L) 78.0 - 100.0 fL   MCH 26.0 26.0 - 34.0 pg   MCHC 34.2 30.0 - 36.0 g/dL   RDW 16.0 (H) 11.5 - 15.5 %  Platelets 485 (H) 150 - 400 K/uL   Neutrophils Relative % 79 %   Neutro Abs 11.9 (H) 1.7 - 7.7 K/uL   Lymphocytes Relative 14 %   Lymphs Abs 2.2 0.7 - 4.0 K/uL   Monocytes Relative 5 %   Monocytes Absolute 0.8 0.1 - 1.0 K/uL   Eosinophils Relative 2 %   Eosinophils Absolute 0.4 0.0 - 0.7 K/uL   Basophils Relative 0 %   Basophils Absolute 0.0 0.0 - 0.1 K/uL    Comment: Performed at [facility], [place] [address], [place], [place] [address]  Comprehensive metabolic panel     Status: Abnormal   Collection Time: 10/09/17  9:56 PM  Result Value Ref Range   Sodium 140 135 - 145 mmol/L   Potassium 3.8 3.5 - 5.1 mmol/L   Chloride 104 101 - 111 mmol/L   CO2 26 22 - 32 mmol/L   Glucose, Bld 140 (H) 65 - 99 mg/dL   BUN 13 6 - 20 mg/dL   Creatinine, Ser 0.97 0.44 - 1.00 mg/dL   Calcium 9.3 8.9 - 10.3 mg/dL   Total Protein 8.0 6.5 - 8.1 g/dL   Albumin 3.2 (L) 3.5 - 5.0 g/dL   AST 19 15 - 41 U/L   ALT 8 (L) 14 - 54 U/L   Alkaline Phosphatase 67 38 - 126 U/L   Total Bilirubin 0.2 (L) 0.3 - 1.2 mg/dL   GFR calc non Af Amer >60 >60 mL/min   GFR calc Af Amer >60 >60 mL/min    Comment: (NOTE) The eGFR has been calculated using the CKD EPI equation. This calculation has not been validated in all clinical situations. eGFR's persistently <60 mL/min signify possible Chronic Kidney Disease.    Anion gap 10 5 - 15    Comment: Performed at [facility], [place] [address] [place], [place] [address]  Lipase, blood     Status: None   Collection Time: 10/09/17  9:56 PM  Result Value Ref Range   Lipase 39 11 - 51 U/L    Comment: Performed at [facility], [place] [address], [place], [place] [address]  Urinalysis, Routine w reflex microscopic     Status: Abnormal   Collection Time: 10/09/17  9:56 PM  Result Value Ref Range   Color, Urine STRAW (A) YELLOW   APPearance CLEAR CLEAR   Specific Gravity, Urine 1.011 1.005 - 1.030   pH 8.0 5.0 - 8.0   Glucose, UA 150 (A) NEGATIVE mg/dL   Hgb urine dipstick NEGATIVE NEGATIVE   Bilirubin Urine NEGATIVE NEGATIVE   Ketones, ur 5 (A) NEGATIVE mg/dL   Protein, ur 100 (A) NEGATIVE mg/dL   Nitrite NEGATIVE NEGATIVE   Leukocytes, UA NEGATIVE NEGATIVE   RBC / HPF 0-5 0 - 5 RBC/hpf   WBC, UA 0-5 0 - 5 WBC/hpf   Bacteria, UA NONE SEEN NONE SEEN   Squamous Epithelial / LPF 0-5 0 - 5   Mucus PRESENT     Comment: Performed at [facility], [place] [address], [place], [place] [address]  I-Stat Beta hCG blood, ED ([facility], [facility], [facility] only)     Status: None   Collection Time: 10/09/17 10:01 PM  Result Value Ref Range   I-stat hCG, quantitative <5.0 <5 mIU/mL   Comment 3            Comment:   GEST. AGE      CONC.  (mIU/mL)   <=1 WEEK  5 - 50     2 WEEKS       50 - 500     3 WEEKS       100 - 10,000     4 WEEKS     1,000 - 30,000        FEMALE AND NON-PREGNANT FEMALE:     LESS THAN 5 mIU/mL   Rapid urine drug screen (hospital performed)     Status: Abnormal   Collection Time: 10/10/17 12:42 AM  Result Value Ref Range   Opiates NONE DETECTED NONE DETECTED   Cocaine POSITIVE (A) NONE DETECTED   Benzodiazepines NONE DETECTED NONE DETECTED   Amphetamines NONE DETECTED NONE DETECTED   Tetrahydrocannabinol NONE DETECTED NONE DETECTED   Barbiturates (A) NONE DETECTED    Result not available. Reagent lot number recalled by manufacturer.    Comment: Performed at  [facility], [place] [address], [place], [place] [address]   Dg Chest 2 View  Result Date: 10/09/2017 CLINICAL DATA:  Abdominal pain, nausea and vomiting for 2 days. EXAM: CHEST - 2 VIEW COMPARISON:  PA and lateral chest CT 11/28/2016.  Chest 11/07/2016. FINDINGS: Lungs are clear. Heart size is normal. No pneumothorax or pleural effusion. No focal bony abnormality. IMPRESSION: Negative chest. Electronically Signed   By: Inge Rise M.D.   On: 10/09/2017 21:39    Pending Labs Unresulted Labs (From admission, onward)   Start     Ordered   10/10/17 0500  CBC  Tomorrow morning,   R     10/10/17 0218   10/10/17 3888  Basic metabolic panel  Tomorrow morning,   R     10/10/17 0218   10/10/17 0500  Magnesium  Tomorrow morning,   R     10/10/17 0218   10/09/17 2005  Urine culture  STAT,   STAT     10/09/17 2005      Vitals/Pain Today's Vitals   10/09/17 2330 10/10/17 0000 10/10/17 0030 10/10/17 0130  BP: (!) 197/173 (!) 188/152 (!) 193/175 (!) 176/101  Pulse: (!) 111 (!) 106 (!) 111 (!) 117  Resp:  20 20 19   Temp:      TempSrc:      SpO2: 97% 96% 97% 99%  Weight:      Height:      PainSc:        Isolation Precautions No active isolations  Medications Medications  ipratropium-albuterol (DUONEB) 0.5-2.5 (3) MG/3ML nebulizer solution 3 mL (0 mLs Nebulization Hold 10/09/17 2336)  sodium chloride 0.9 % bolus 1,000 mL (has no administration in time range)  promethazine (PHENERGAN) injection 12.5 mg (has no administration in time range)  dicyclomine (BENTYL) injection 20 mg (has no administration in time range)  ketorolac (TORADOL) 30 MG/ML injection 30 mg (has no administration in time range)  amLODipine (NORVASC) tablet 10 mg (has no administration in time range)  metoprolol tartrate (LOPRESSOR) tablet 100 mg (has no administration in time range)  0.9 %  sodium chloride infusion (has no administration in time range)  acetaminophen (TYLENOL) tablet 650 mg (has no  administration in time range)    Or  acetaminophen (TYLENOL) suppository 650 mg (has no administration in time range)  hydrALAZINE (APRESOLINE) injection 10 mg (has no administration in time range)  enoxaparin (LOVENOX) injection 100 mg (has no administration in time range)  enoxaparin (LOVENOX) injection 100 mg (has no administration in time range)  sodium chloride 0.9 % bolus 1,000 mL (0 mLs Intravenous Stopped 10/10/17  0106)  ondansetron (ZOFRAN) injection 4 mg (4 mg Intravenous Given 10/09/17 2142)  haloperidol lactate (HALDOL) injection 1 mg (1 mg Intravenous Given 10/09/17 2137)  metoprolol tartrate (LOPRESSOR) injection 5 mg (5 mg Intravenous Given 10/09/17 2236)  LORazepam (ATIVAN) injection 1 mg (1 mg Intramuscular Given 10/09/17 2239)  ondansetron (ZOFRAN) 8 mg in sodium chloride 0.9 % 50 mL IVPB (0 mg Intravenous Stopped 10/09/17 2323)  labetalol (NORMODYNE,TRANDATE) injection 10 mg (10 mg Intravenous Given 10/09/17 2336)  hydrALAZINE (APRESOLINE) injection 10 mg (10 mg Intravenous Given 10/10/17 0116)    Mobility walks

## 2017-10-10 NOTE — Progress Notes (Signed)
Patient admitted from ED to room 1441, accompanied by boyfriend, both drowsy. Patient shaking continuously in bed   and will not comply with heart monitor application. BP very high on admission, pt  shaking during intake of bp. IV hydralazine given, pt not alert enough  to swallow po med. Pt complain of abd pain and said she shakes when she is in pain, NP on call made aware and one time order for IV Toradol given. Pt tolerated well.  IV fluid infusing at  this time. Patient still  shaking.

## 2017-10-10 NOTE — Progress Notes (Signed)
ANTICOAGULATION CONSULT NOTE - Initial Consult  Pharmacy Consult for Enoxaparin Indication: VTE Treatment  Allergies  Allergen Reactions  . Lisinopril Nausea Only and Swelling  . Morphine And Related Itching and Swelling    Patient Measurements: Height: 5\' 7"  (170.2 cm) Weight: 214 lb (97.1 kg) IBW/kg (Calculated) : 61.6 Heparin Dosing Weight:   Vital Signs: Temp: 98.9 F (37.2 C) (06/19 0354) Temp Source: Oral (06/19 0354) BP: 215/175 (06/19 0354) Pulse Rate: 116 (06/19 0354)  Labs: Recent Labs    10/09/17 2156 10/10/17 0423  HGB 12.3 11.9*  HCT 36.0 34.8*  PLT 485* 472*  CREATININE 0.97  --     Estimated Creatinine Clearance: 88.6 mL/min (by C-G formula based on SCr of 0.97 mg/dL).   Medical History: Past Medical History:  Diagnosis Date  . Abscess of tunica vaginalis    10/09- Abundant S. aureus- sensitive to all abx  . Anxiety   . Blood dyscrasia   . CAD (coronary artery disease) 06/15/2006   s/p Subendocardial MI with PDA angioplasty(no stent) on 06/15/06 and relook  cath 06/19/06 showed patency of site. Cath 12/10- no restenosis or significant CAD progression  . CVA (cerebral vascular accident) ([place]) ~ 02/2014   denies residual on 04/22/2014  . CVA (cerebral vascular accident) [facility])    history of remote right cerebellar infarct noted on head CT at least since 10/2011  . Depression   . Diabetes mellitus type 2, uncontrolled, with complications ([place])   . Fibromyalgia   . Gastritis   . Gastroparesis    secondary to poorly controlled DM, last emptying study performed 01/2010  was normal but may be falsely positive as pt was on reglan  . GERD (gastroesophageal reflux disease)   . Hepatitis B, chronic (HCC)    Hep BeAb+,Hep B cAb+ & Hep BsAg+ (9/06)  . History of pyelonephritis    H/o GrpB Pyelonephritis (9/06) and UTI- 07/11- E.Coli, 12/10- GBS  . Hyperlipidemia   . Hypertension   . Iron deficiency anemia   . Irregular menses    Small ovarian follicles  seen on QA(8/34)  . MI (myocardial infarction) ([place]) 05/2006   PDA percutaneous transluminal coronary angioplasty  . Migraine    "weekly" (04/22/2014)  . N&V (nausea and vomiting)    Chronic. Unclear etiology with multiple admission and ED visits. CT abdomen with and without contrast (02/2011)  showed no acute process. Gastic Emptying scan (01/2010) was normal. Ultrasound of the abdomen was within normal limits. Hepatitis B viral load was undectable. HIV NR. EGD - gastritis, Hpylori + s/p Rx  . Obesity   . OSA (obstructive sleep apnea)    "suppose to wear mask but I don't" (04/22/2014)  . Peripheral neuropathy   . Pneumonia    "this is probably the 2nd or 3rd time I've had pneumonia" (04/22/2014)  . Recurrent boils   . Seasonal asthma   . Substance abuse ([place])   . Thrombocytosis (HCC)    Hem/Onc suggested 2/2 chronic hepatits and/or iron deficiency anemia    Medications:  Scheduled:  . amLODipine  10 mg Oral Daily  . enoxaparin (LOVENOX) injection  100 mg Subcutaneous Q12H  . ipratropium-albuterol  3 mL Nebulization Once  . metoprolol tartrate  100 mg Oral BID    Assessment: Patient with prior apixaban use for PE and MD wants enoxaparin per pharmacy until able to take apixaban again.  Last dose apixaban per med rec this past week.  Goal of Therapy:  Anti-Xa level 0.6-1 units/ml 4hrs  after LMWH dose given Monitor platelets by anticoagulation protocol: Yes   Plan:  Enoxaparin 100mg  sq q12hr  Nani Skillern Crowford 10/10/2017,5:08 AM

## 2017-10-10 NOTE — ED Provider Notes (Signed)
2:00 AM Patient care assumed at shift change from Carmon Sails, [place].  Patient presenting for abdominal pain x2 days with nausea and vomiting.  History of cocaine abuse and reports last using cocaine 2 days ago as well.  She has been admitted twice in May for intractable nausea vomiting secondary to gastroparesis.  She states that her abdominal pain today feels similar.  She had an unremarkable CT scan on 09/20/2017 in the setting of similar complaints.  Laboratory work-up with stable, chronic leukocytosis.  She has no evidence of DKA today.  Liver and kidney function preserved.  Patient has been hydrated with IV fluids and given multiple rounds of antiemetics.  She continues to be visibly uncomfortable in the bed with ongoing nausea and vomiting.  Plan for admission for management of intractable emesis.  The patient was also found to be hypertensive in the ED.  She has been given metoprolol, labetalol, hydralazine with moderate BP management.  Case discussed with Dr. Tamala Julian of [facility] who will admit.   Results for orders placed or performed during the hospital encounter of 10/09/17  CBC with Differential  Result Value Ref Range   WBC 15.3 (H) 4.0 - 10.5 K/uL   RBC 4.73 3.87 - 5.11 MIL/uL   Hemoglobin 12.3 12.0 - 15.0 g/dL   HCT 36.0 36.0 - 46.0 %   MCV 76.1 (L) 78.0 - 100.0 fL   MCH 26.0 26.0 - 34.0 pg   MCHC 34.2 30.0 - 36.0 g/dL   RDW 16.0 (H) 11.5 - 15.5 %   Platelets 485 (H) 150 - 400 K/uL   Neutrophils Relative % 79 %   Neutro Abs 11.9 (H) 1.7 - 7.7 K/uL   Lymphocytes Relative 14 %   Lymphs Abs 2.2 0.7 - 4.0 K/uL   Monocytes Relative 5 %   Monocytes Absolute 0.8 0.1 - 1.0 K/uL   Eosinophils Relative 2 %   Eosinophils Absolute 0.4 0.0 - 0.7 K/uL   Basophils Relative 0 %   Basophils Absolute 0.0 0.0 - 0.1 K/uL  Comprehensive metabolic panel  Result Value Ref Range   Sodium 140 135 - 145 mmol/L   Potassium 3.8 3.5 - 5.1 mmol/L   Chloride 104 101 - 111 mmol/L   CO2 26 22 - 32  mmol/L   Glucose, Bld 140 (H) 65 - 99 mg/dL   BUN 13 6 - 20 mg/dL   Creatinine, Ser 0.97 0.44 - 1.00 mg/dL   Calcium 9.3 8.9 - 10.3 mg/dL   Total Protein 8.0 6.5 - 8.1 g/dL   Albumin 3.2 (L) 3.5 - 5.0 g/dL   AST 19 15 - 41 U/L   ALT 8 (L) 14 - 54 U/L   Alkaline Phosphatase 67 38 - 126 U/L   Total Bilirubin 0.2 (L) 0.3 - 1.2 mg/dL   GFR calc non Af Amer >60 >60 mL/min   GFR calc Af Amer >60 >60 mL/min   Anion gap 10 5 - 15  Lipase, blood  Result Value Ref Range   Lipase 39 11 - 51 U/L  Urinalysis, Routine w reflex microscopic  Result Value Ref Range   Color, Urine STRAW (A) YELLOW   APPearance CLEAR CLEAR   Specific Gravity, Urine 1.011 1.005 - 1.030   pH 8.0 5.0 - 8.0   Glucose, UA 150 (A) NEGATIVE mg/dL   Hgb urine dipstick NEGATIVE NEGATIVE   Bilirubin Urine NEGATIVE NEGATIVE   Ketones, ur 5 (A) NEGATIVE mg/dL   Protein, ur 100 (A) NEGATIVE mg/dL  Nitrite NEGATIVE NEGATIVE   Leukocytes, UA NEGATIVE NEGATIVE   RBC / HPF 0-5 0 - 5 RBC/hpf   WBC, UA 0-5 0 - 5 WBC/hpf   Bacteria, UA NONE SEEN NONE SEEN   Squamous Epithelial / LPF 0-5 0 - 5   Mucus PRESENT   Rapid urine drug screen (hospital performed)  Result Value Ref Range   Opiates NONE DETECTED NONE DETECTED   Cocaine POSITIVE (A) NONE DETECTED   Benzodiazepines NONE DETECTED NONE DETECTED   Amphetamines NONE DETECTED NONE DETECTED   Tetrahydrocannabinol NONE DETECTED NONE DETECTED   Barbiturates (A) NONE DETECTED    Result not available. Reagent lot number recalled by manufacturer.  I-Stat Beta hCG blood, ED ([facility], [facility], [facility] only)  Result Value Ref Range   I-stat hCG, quantitative <5.0 <5 mIU/mL   Comment 3           Dg Chest 2 View  Result Date: 10/09/2017 CLINICAL DATA:  Abdominal pain, nausea and vomiting for 2 days. EXAM: CHEST - 2 VIEW COMPARISON:  PA and lateral chest CT 11/28/2016.  Chest 11/07/2016. FINDINGS: Lungs are clear. Heart size is normal. No pneumothorax or pleural effusion. No focal bony  abnormality. IMPRESSION: Negative chest. Electronically Signed   By: Inge Rise M.D.   On: 10/09/2017 21:39   Ct Abdomen Pelvis W Contrast  Result Date: 09/21/2017 CLINICAL DATA:  Diffuse abdominal pain, nausea, and vomiting. EXAM: CT ABDOMEN AND PELVIS WITH CONTRAST TECHNIQUE: Multidetector CT imaging of the abdomen and pelvis was performed using the standard protocol following bolus administration of intravenous contrast. CONTRAST:  19mL OMNIPAQUE IOHEXOL 300 MG/ML  SOLN COMPARISON:  12/24/2016 FINDINGS: Lower chest: The visualized lung bases are clear. A 2 cm pericardial or other cyst abutting the posterior margin of the left atrium is unchanged. Hepatobiliary: The hepatic dome was incompletely imaged on the portal venous phase images. No focal liver abnormality is identified. The gallbladder is unremarkable. There is no biliary dilatation. Pancreas: Mild fatty infiltration in the pancreatic head. No ductal dilatation or peripancreatic inflammation. Spleen: Unremarkable. Adrenals/Urinary Tract: Unremarkable adrenal glands. Excreted IV contrast in the renal collecting systems. No evidence of renal mass or hydronephrosis. Bladder contains a small amount of excreted contrast and is otherwise unremarkable. Stomach/Bowel: The stomach is within normal limits. There is no evidence of bowel obstruction. The appendix is unremarkable. Vascular/Lymphatic: No significant vascular findings are present. No enlarged abdominal or pelvic lymph nodes. Reproductive: Exophytic uterine fibroid has decreased in size, with the hypoattenuating portion of the mass now measuring 4.1 cm (previously 6.0 cm). Unremarkable ovaries. Other: No intraperitoneal free fluid.  No abdominal wall hernia. Musculoskeletal: Advanced disc degeneration at L5-S1 with bilateral neural foraminal stenosis. IMPRESSION: 1. No acute abnormality identified in the abdomen or pelvis. 2. Decreased size of previously treated uterine fibroid. Electronically  Signed   By: Logan Bores M.D.   On: 09/21/2017 12:56      Antonietta Breach, PA-C 10/10/17 0202    Veryl Speak, MD 10/10/17 [phone]

## 2017-10-11 LAB — URINE CULTURE: Culture: NO GROWTH

## 2017-10-11 LAB — GLUCOSE, CAPILLARY
GLUCOSE-CAPILLARY: 65 mg/dL (ref 65–99)
Glucose-Capillary: 147 mg/dL — ABNORMAL HIGH (ref 65–99)
Glucose-Capillary: 160 mg/dL — ABNORMAL HIGH (ref 65–99)
Glucose-Capillary: 160 mg/dL — ABNORMAL HIGH (ref 65–99)
Glucose-Capillary: 90 mg/dL (ref 65–99)

## 2017-10-11 NOTE — Progress Notes (Signed)
PROGRESS NOTE    Shirley [name]  KNL:[id number] DOB: [birth year] DOA: 10/09/2017 PCP: Clent Demark, PA-C    Brief Narrative:  Patient is a 42 year old with recent history of cocaine use, diabetes, diabetic gastroparesis, GERD.  Presenting to the hospital with nausea and vomiting.  Reportedly patient has had this problem in the past and has presented multiple times in the hospital with same complaint.  Currently hospitalized for intractable nausea and vomiting.   Assessment & Plan:   Principal Problem:   Intractable nausea and vomiting - I suspect this is secondary to recent cocaine use and/or gastroparesis.  Continue Reglan for nausea and emesis - had to place on liquid diet from diabetic diet due to poor oral intake.   Active Problems:   Polysubstance abuse ([place]) - stable currently    Diabetic gastroparesis associated with type 2 diabetes mellitus ([place]) - continue SSI    History of pulmonary embolism/Chronic anticoagulation -  Pt still not tolerating orals well. Will continue lovenox.   GERD (gastroesophageal reflux disease) -    Leukocytosis - Most likely a stress reaction will reassess next a.m.    Prolonged QT interval - resolved.   DVT prophylaxis: Lovenox Code Status: Full Family Communication: None at bedside. Disposition Plan: Once patient able to eat   Consultants:   none   Procedures: none   Antimicrobials: None   Subjective: Pt reports still having poor oral intake. Does not want to eat.   Objective: Vitals:   10/10/17 2111 10/11/17 0542 10/11/17 0910 10/11/17 1426  BP: 130/85 (!) 144/89 (!) 177/116 114/77  Pulse: (!) 113 100  67  Resp: 18 20  20   Temp: 98.6 F (37 C) 98.3 F (36.8 C)  98.2 F (36.8 C)  TempSrc: Oral Oral  Oral  SpO2: 98% 98%  100%  Weight:      Height:        Intake/Output Summary (Last 24 hours) at 10/11/2017 1601 Last data filed at 10/11/2017 1500 Gross per 24 hour  Intake 3046.66 ml  Output 1500 ml    Net 1546.66 ml   Filed Weights   10/09/17 1920  Weight: 97.1 kg (214 lb)    Examination:  General exam: Appears calm and comfortable, in nad.  Respiratory system: Clear to auscultation. Respiratory effort normal. Cardiovascular system: S1 & S2 heard, RRR. No JVD, murmurs, rubs Gastrointestinal system: Abdomen is nondistended, soft and nontender. No organomegaly or masses felt. Normal bowel sounds heard. Central nervous system: Alert and oriented. No focal neurological deficits. Extremities: Symmetric 5 x 5 power. Skin: No rashes, lesions or ulcers, on limited exam. Psychiatry: Mood & affect appropriate.   Data Reviewed: I have personally reviewed following labs and imaging studies  CBC: Recent Labs  Lab 10/09/17 2156 10/10/17 0423  WBC 15.3* 16.1*  NEUTROABS 11.9*  --   HGB 12.3 11.9*  HCT 36.0 34.8*  MCV 76.1* 74.0*  PLT 485* 790*   Basic Metabolic Panel: Recent Labs  Lab 10/09/17 2156 10/10/17 0423  NA 140 139  K 3.8 3.6  CL 104 105  CO2 26 24  GLUCOSE 140* 212*  BUN 13 13  CREATININE 0.97 0.92  CALCIUM 9.3 8.9  MG  --  1.5*   GFR: Estimated Creatinine Clearance: 93.4 mL/min (by C-G formula based on SCr of 0.92 mg/dL). Liver Function Tests: Recent Labs  Lab 10/09/17 2156  AST 19  ALT 8*  ALKPHOS 67  BILITOT 0.2*  PROT 8.0  ALBUMIN 3.2*   Recent  Labs  Lab 10/09/17 2156  LIPASE 39   No results for input(s): AMMONIA in the last 168 hours. Coagulation Profile: No results for input(s): INR, PROTIME in the last 168 hours. Cardiac Enzymes: No results for input(s): CKTOTAL, CKMB, CKMBINDEX, TROPONINI in the last 168 hours. BNP (last 3 results) No results for input(s): PROBNP in the last 8760 hours. HbA1C: No results for input(s): HGBA1C in the last 72 hours. CBG: Recent Labs  Lab 10/10/17 1212 10/10/17 1625 10/10/17 2109 10/11/17 0724 10/11/17 1128  GLUCAP 234* 206* 120* 147* 65   Lipid Profile: No results for input(s): CHOL, HDL,  LDLCALC, TRIG, CHOLHDL, LDLDIRECT in the last 72 hours. Thyroid Function Tests: No results for input(s): TSH, T4TOTAL, FREET4, T3FREE, THYROIDAB in the last 72 hours. Anemia Panel: No results for input(s): VITAMINB12, FOLATE, FERRITIN, TIBC, IRON, RETICCTPCT in the last 72 hours. Sepsis Labs: No results for input(s): PROCALCITON, LATICACIDVEN in the last 168 hours.  Recent Results (from the past 240 hour(s))  Urine culture     Status: None   Collection Time: 10/09/17  9:56 PM  Result Value Ref Range Status   Specimen Description   Final    URINE, CLEAN CATCH Performed at [facility], [place] [address] [place], [place] [address]    Special Requests   Final    NONE Performed at [facility], [place] [address] [place], [place] [address]    Culture   Final    NO GROWTH Performed at [facility], [place] [address], [place], [place] [address]    Report Status 10/11/2017 FINAL  Final     Radiology Studies: Dg Chest 2 View  Result Date: 10/09/2017 CLINICAL DATA:  Abdominal pain, nausea and vomiting for 2 days. EXAM: CHEST - 2 VIEW COMPARISON:  PA and lateral chest CT 11/28/2016.  Chest 11/07/2016. FINDINGS: Lungs are clear. Heart size is normal. No pneumothorax or pleural effusion. No focal bony abnormality. IMPRESSION: Negative chest. Electronically Signed   By: Inge Rise M.D.   On: 10/09/2017 21:39    Scheduled Meds: . amLODipine  10 mg Oral Daily  . enoxaparin (LOVENOX) injection  100 mg Subcutaneous Q12H  . insulin aspart  0-15 Units Subcutaneous TID WC  . insulin aspart  0-5 Units Subcutaneous QHS  . ipratropium-albuterol  3 mL Nebulization Once  . metoprolol tartrate  100 mg Oral BID   Continuous Infusions: . sodium chloride 100 mL/hr at 10/11/17 1200     LOS: 0 days    Time spent: 35 min  Velvet Bathe, MD [facility] Pager [phone]  If 7PM-7AM, please contact night-coverage [email or web address] Password  [facility] 10/11/2017, 4:01 PM

## 2017-10-11 NOTE — Progress Notes (Signed)
Patient had not voided this shift. Offer to assist to the bathroom, patient refuses.

## 2017-10-11 NOTE — Plan of Care (Signed)
  Problem: Safety: Goal: Ability to remain free from injury will improve Outcome: Progressing   Problem: Skin Integrity: Goal: Risk for impaired skin integrity will decrease Outcome: Progressing   

## 2017-10-12 LAB — CBC
HCT: 32.6 % — ABNORMAL LOW (ref 36.0–46.0)
Hemoglobin: 11 g/dL — ABNORMAL LOW (ref 12.0–15.0)
MCH: 25.9 pg — ABNORMAL LOW (ref 26.0–34.0)
MCHC: 33.7 g/dL (ref 30.0–36.0)
MCV: 76.9 fL — ABNORMAL LOW (ref 78.0–100.0)
PLATELETS: 431 10*3/uL — AB (ref 150–400)
RBC: 4.24 MIL/uL (ref 3.87–5.11)
RDW: 16.3 % — AB (ref 11.5–15.5)
WBC: 10.9 10*3/uL — AB (ref 4.0–10.5)

## 2017-10-12 LAB — GLUCOSE, CAPILLARY
GLUCOSE-CAPILLARY: 173 mg/dL — AB (ref 65–99)
GLUCOSE-CAPILLARY: 159 mg/dL — AB (ref 65–99)
GLUCOSE-CAPILLARY: 160 mg/dL — AB (ref 65–99)
GLUCOSE-CAPILLARY: 234 mg/dL — AB (ref 65–99)

## 2017-10-12 MED ORDER — METOCLOPRAMIDE HCL 10 MG PO TABS
10.0000 mg | ORAL_TABLET | Freq: Three times a day (TID) | ORAL | Status: DC
  Administered 2017-10-12 – 2017-10-13 (×5): 10 mg via ORAL
  Filled 2017-10-12 (×5): qty 1

## 2017-10-12 MED ORDER — APIXABAN 5 MG PO TABS
5.0000 mg | ORAL_TABLET | Freq: Two times a day (BID) | ORAL | Status: DC
  Administered 2017-10-12 – 2017-10-13 (×2): 5 mg via ORAL
  Filled 2017-10-12 (×2): qty 1

## 2017-10-12 NOTE — Progress Notes (Signed)
PROGRESS NOTE    Shirley [name]  XAJ:[id number] DOB: 1975-10-06 DOA: 10/09/2017 PCP: Clent Demark, PA-C    Brief Narrative:  Patient is a 42 year old with recent history of cocaine use, diabetes, diabetic gastroparesis, GERD.  Presenting to the hospital with nausea and vomiting.  Reportedly patient has had this problem in the past and has presented multiple times in the hospital with same complaint.  Currently hospitalized for intractable nausea and vomiting.  Assessment & Plan:   Principal Problem:   Intractable nausea and vomiting - I suspect this is secondary to recent cocaine use and/or gastroparesis.  - will advance diet and continue prior to admission oral medication regimen. If patient tolerates will plan on discharging.  Active Problems:   Polysubstance abuse ([facility]) - stable currently    Diabetic gastroparesis associated with type 2 diabetes mellitus ([place]) - continue SSI    History of pulmonary embolism/Chronic anticoagulation -  Will try placing back on her home oral regimen.   GERD (gastroesophageal reflux disease) - stable    Leukocytosis - Most likely a stress reaction will reassess next a.m.    Prolonged QT interval - resolved.   DVT prophylaxis: Lovenox Code Status: Full Family Communication: None at bedside. Disposition Plan: tomorrow in am most likely.   Consultants:   none   Procedures: none   Antimicrobials: None   Subjective: No new complaints currently.   Objective: Vitals:   10/11/17 0910 10/11/17 1426 10/11/17 2113 10/12/17 0657  BP: (!) 177/116 114/77 (!) 156/111 (!) 143/110  Pulse:  67 91 89  Resp:  20 16 14   Temp:  98.2 F (36.8 C) 99.2 F (37.3 C) 98.6 F (37 C)  TempSrc:  Oral Oral Oral  SpO2:  100% 100% 100%  Weight:      Height:        Intake/Output Summary (Last 24 hours) at 10/12/2017 1301 Last data filed at 10/12/2017 1000 Gross per 24 hour  Intake 3460 ml  Output -  Net 3460 ml   Filed Weights   10/09/17 1920  Weight: 97.1 kg (214 lb)    Examination:  General exam: Appears calm and comfortable, in nad.  Respiratory system: Clear to auscultation. Respiratory effort normal. Cardiovascular system: S1 & S2 heard, RRR. No JVD, murmurs, rubs Gastrointestinal system: Abdomen is nondistended, soft and nontender. No organomegaly or masses felt. Normal bowel sounds heard. Central nervous system: Alert and oriented. No focal neurological deficits. Extremities: Symmetric 5 x 5 power. Skin: No rashes, lesions or ulcers, on limited exam. Psychiatry: Mood & affect appropriate.   Data Reviewed: I have personally reviewed following labs and imaging studies  CBC: Recent Labs  Lab 10/09/17 2156 10/10/17 0423 10/12/17 0431  WBC 15.3* 16.1* 10.9*  NEUTROABS 11.9*  --   --   HGB 12.3 11.9* 11.0*  HCT 36.0 34.8* 32.6*  MCV 76.1* 74.0* 76.9*  PLT 485* 472* 094*   Basic Metabolic Panel: Recent Labs  Lab 10/09/17 2156 10/10/17 0423  NA 140 139  K 3.8 3.6  CL 104 105  CO2 26 24  GLUCOSE 140* 212*  BUN 13 13  CREATININE 0.97 0.92  CALCIUM 9.3 8.9  MG  --  1.5*   GFR: Estimated Creatinine Clearance: 93.4 mL/min (by C-G formula based on SCr of 0.92 mg/dL). Liver Function Tests: Recent Labs  Lab 10/09/17 2156  AST 19  ALT 8*  ALKPHOS 67  BILITOT 0.2*  PROT 8.0  ALBUMIN 3.2*   Recent Labs  Lab 10/09/17  2156  LIPASE 39   No results for input(s): AMMONIA in the last 168 hours. Coagulation Profile: No results for input(s): INR, PROTIME in the last 168 hours. Cardiac Enzymes: No results for input(s): CKTOTAL, CKMB, CKMBINDEX, TROPONINI in the last 168 hours. BNP (last 3 results) No results for input(s): PROBNP in the last 8760 hours. HbA1C: No results for input(s): HGBA1C in the last 72 hours. CBG: Recent Labs  Lab 10/11/17 1227 10/11/17 1745 10/11/17 2132 10/12/17 0750 10/12/17 1214  GLUCAP 160* 160* 90 173* 159*   Lipid Profile: No results for input(s): CHOL,  HDL, LDLCALC, TRIG, CHOLHDL, LDLDIRECT in the last 72 hours. Thyroid Function Tests: No results for input(s): TSH, T4TOTAL, FREET4, T3FREE, THYROIDAB in the last 72 hours. Anemia Panel: No results for input(s): VITAMINB12, FOLATE, FERRITIN, TIBC, IRON, RETICCTPCT in the last 72 hours. Sepsis Labs: No results for input(s): PROCALCITON, LATICACIDVEN in the last 168 hours.  Recent Results (from the past 240 hour(s))  Urine culture     Status: None   Collection Time: 10/09/17  9:56 PM  Result Value Ref Range Status   Specimen Description   Final    URINE, CLEAN CATCH Performed at [facility], [place] [address], [place], [place] [address]    Special Requests   Final    NONE Performed at [facility], [place] [address], [place], [place] [address]    Culture   Final    NO GROWTH Performed at [facility], [place] [address], [place], [place] [address]    Report Status 10/11/2017 FINAL  Final     Radiology Studies: No results found.  Scheduled Meds: . amLODipine  10 mg Oral Daily  . apixaban  5 mg Oral BID  . insulin aspart  0-15 Units Subcutaneous TID WC  . insulin aspart  0-5 Units Subcutaneous QHS  . ipratropium-albuterol  3 mL Nebulization Once  . metoCLOPramide  10 mg Oral TID AC & HS  . metoprolol tartrate  100 mg Oral BID   Continuous Infusions: . sodium chloride 100 mL/hr at 10/12/17 0731     LOS: 1 day    Time spent: 35 min  Velvet Bathe, MD [facility] Pager [phone]  If 7PM-7AM, please contact night-coverage [email or web address] Password [email or web address] 10/12/2017, 1:01 PM

## 2017-10-13 DIAGNOSIS — R112 Nausea with vomiting, unspecified: Secondary | ICD-10-CM

## 2017-10-13 LAB — GLUCOSE, CAPILLARY
GLUCOSE-CAPILLARY: 79 mg/dL (ref 65–99)
Glucose-Capillary: 254 mg/dL — ABNORMAL HIGH (ref 65–99)

## 2017-10-13 NOTE — Plan of Care (Signed)
  Problem: Education: Goal: Knowledge of General Education information will improve Outcome: Progressing   Problem: Health Behavior/Discharge Planning: Goal: Ability to manage health-related needs will improve Outcome: Progressing   Problem: Elimination: Goal: Will not experience complications related to bowel motility Outcome: Progressing Goal: Will not experience complications related to urinary retention Outcome: Progressing   Problem: Pain Managment: Goal: General experience of comfort will improve Outcome: Progressing   Problem: Safety: Goal: Ability to remain free from injury will improve Outcome: Progressing   Problem: Skin Integrity: Goal: Risk for impaired skin integrity will decrease Outcome: Progressing   

## 2017-10-13 NOTE — Progress Notes (Signed)
Discharge instructions reviewed with patient utilizing teach back method patient discharging to home when ride available

## 2017-10-13 NOTE — Discharge Summary (Signed)
Physician Discharge Summary  Shirley [name] DJS:[id number] DOB: 08/23/1975 DOA: 10/09/2017  PCP: Clent Demark, PA-C  Admit date: 10/09/2017 Discharge date: 10/13/2017  Time spent: 36 minutes  Recommendations for Outpatient Follow-up:  1. Please be sure to encourage drug use cessation 2. Encourage patient to take her Reglan as recommended.   Discharge Diagnoses:  Principal Problem:   Intractable nausea and vomiting Active Problems:   Polysubstance abuse ([place])   Diabetic gastroparesis associated with type 2 diabetes mellitus ([facility])   History of pulmonary embolism   Chronic anticoagulation   GERD (gastroesophageal reflux disease)   Leukocytosis   Prolonged QT interval   Discharge Condition: stable  Diet recommendation: carb modified diet.  Filed Weights   10/09/17 1920  Weight: 97.1 kg (214 lb)    History of present illness:  Patient is a 42 year old with recent history of cocaine use, diabetes, diabetic gastroparesis, GERD.  Presenting to the hospital with nausea and vomiting.  Reportedly patient has had this problem in the past and has presented multiple times in the hospital with same complaint.  Currently hospitalized for intractable nausea and vomiting.  Hospital Course:  Principal Problem:   Intractable nausea and vomiting - I suspect this is secondary to recent cocaine use and/or gastroparesis.  - Improved with supportive care and on Reglan.  Active Problems:   Polysubstance abuse ([facility]) - Stable currently    Diabetic gastroparesis associated with type 2 diabetes mellitus ([place]) - Continue SSI    History of pulmonary embolism/Chronic anticoagulation -  Placed back on prior to admission medication regimen.   GERD (gastroesophageal reflux disease) - Stable    Leukocytosis - Most likely a stress reaction will reassess next a.m.    Prolonged QT interval - resolved.   Procedures:  none  Consultations:  none  Discharge Exam: Vitals:   10/13/17 0517 10/13/17 0924  BP: (!) 150/87 (!) 143/86  Pulse: 92 83  Resp: 18   Temp: 98.8 F (37.1 C) 98.7 F (37.1 C)  SpO2: 97% 97%    General: Pt in nad, alert and awake Cardiovascular: rrr, no rubs Respiratory: no increased wob, no wheezes  Discharge Instructions   Discharge Instructions    Call MD for:  severe uncontrolled pain   Complete by:  As directed    Call MD for:  temperature >100.4   Complete by:  As directed    Diet - low sodium heart healthy   Complete by:  As directed    Discharge instructions   Complete by:  As directed    Please be sure to follow up with your primary care physician in the next 1-2 weeks or sooner should any new concern arise.   Increase activity slowly   Complete by:  As directed      Allergies as of 10/13/2017      Reactions   Lisinopril Nausea Only, Swelling   Morphine And Related Itching, Swelling      Medication List    TAKE these medications   albuterol 108 (90 Base) MCG/ACT inhaler Commonly known as:  PROVENTIL HFA;VENTOLIN HFA Inhale 2 puffs into the lungs every 4 (four) hours as needed for wheezing or shortness of breath.   amLODipine 10 MG tablet Commonly known as:  NORVASC Take 1 tablet (10 mg total) by mouth daily.   apixaban 5 MG Tabs tablet Commonly known as:  ELIQUIS Take 1 tablet (5 mg total) by mouth 2 (two) times daily.   feeding supplement (GLUCERNA SHAKE) Liqd Take 237  mLs by mouth 3 (three) times daily between meals.   freestyle lancets Use as instructed   glucose blood test strip Commonly known as:  FREESTYLE TEST STRIPS Use as instructed   glucose monitoring kit monitoring kit 1 each by Does not apply route 4 (four) times daily - after meals and at bedtime. 1 month Diabetic Testing Supplies for QAC-QHS accuchecks.   insulin NPH Human 100 UNIT/ML injection Commonly known as:  HUMULIN N Inject 0.12 mLs (12 Units total) into the skin 2 (two) times daily before a meal.   INSULIN SYRINGE  .5CC/28G 28G X 1/2" 0.5 ML Misc Commonly known as:  INS SYRINGE/NEEDLE .5CC/28G Please provide 1 month supply   metoCLOPramide 10 MG tablet Commonly known as:  REGLAN Take 1 tablet (10 mg total) by mouth 4 (four) times daily -  before meals and at bedtime.   metoprolol tartrate 100 MG tablet Commonly known as:  LOPRESSOR Take 1 tablet (100 mg total) by mouth 2 (two) times daily.   ondansetron 4 MG tablet Commonly known as:  ZOFRAN Take 1 tablet (4 mg total) by mouth every 6 (six) hours as needed for nausea.   pantoprazole 40 MG tablet Commonly known as:  PROTONIX Take 1 tablet (40 mg total) by mouth 2 (two) times daily.   traMADol 50 MG tablet Commonly known as:  ULTRAM Take 1 tablet (50 mg total) by mouth every 12 (twelve) hours as needed.      Allergies  Allergen Reactions  . Lisinopril Nausea Only and Swelling  . Morphine And Related Itching and Swelling      The results of significant diagnostics from this hospitalization (including imaging, microbiology, ancillary and laboratory) are listed below for reference.    Significant Diagnostic Studies: Dg Chest 2 View  Result Date: 10/09/2017 CLINICAL DATA:  Abdominal pain, nausea and vomiting for 2 days. EXAM: CHEST - 2 VIEW COMPARISON:  PA and lateral chest CT 11/28/2016.  Chest 11/07/2016. FINDINGS: Lungs are clear. Heart size is normal. No pneumothorax or pleural effusion. No focal bony abnormality. IMPRESSION: Negative chest. Electronically Signed   By: Inge Rise M.D.   On: 10/09/2017 21:39   Ct Abdomen Pelvis W Contrast  Result Date: 09/21/2017 CLINICAL DATA:  Diffuse abdominal pain, nausea, and vomiting. EXAM: CT ABDOMEN AND PELVIS WITH CONTRAST TECHNIQUE: Multidetector CT imaging of the abdomen and pelvis was performed using the standard protocol following bolus administration of intravenous contrast. CONTRAST:  1108m OMNIPAQUE IOHEXOL 300 MG/ML  SOLN COMPARISON:  12/24/2016 FINDINGS: Lower chest: The  visualized lung bases are clear. A 2 cm pericardial or other cyst abutting the posterior margin of the left atrium is unchanged. Hepatobiliary: The hepatic dome was incompletely imaged on the portal venous phase images. No focal liver abnormality is identified. The gallbladder is unremarkable. There is no biliary dilatation. Pancreas: Mild fatty infiltration in the pancreatic head. No ductal dilatation or peripancreatic inflammation. Spleen: Unremarkable. Adrenals/Urinary Tract: Unremarkable adrenal glands. Excreted IV contrast in the renal collecting systems. No evidence of renal mass or hydronephrosis. Bladder contains a small amount of excreted contrast and is otherwise unremarkable. Stomach/Bowel: The stomach is within normal limits. There is no evidence of bowel obstruction. The appendix is unremarkable. Vascular/Lymphatic: No significant vascular findings are present. No enlarged abdominal or pelvic lymph nodes. Reproductive: Exophytic uterine fibroid has decreased in size, with the hypoattenuating portion of the mass now measuring 4.1 cm (previously 6.0 cm). Unremarkable ovaries. Other: No intraperitoneal free fluid.  No abdominal wall hernia. Musculoskeletal:  Advanced disc degeneration at L5-S1 with bilateral neural foraminal stenosis. IMPRESSION: 1. No acute abnormality identified in the abdomen or pelvis. 2. Decreased size of previously treated uterine fibroid. Electronically Signed   By: Logan Bores M.D.   On: 09/21/2017 12:56    Microbiology: Recent Results (from the past 240 hour(s))  Urine culture     Status: None   Collection Time: 10/09/17  9:56 PM  Result Value Ref Range Status   Specimen Description   Final    URINE, CLEAN CATCH Performed at [place] [address] [place], [place] [address]    Special Requests   Final    NONE Performed at [facility], [place], [place] [address], [place], [place] [address]    Culture   Final    NO GROWTH Performed  at [facility], [place] [address], [place], [place] [address]    Report Status 10/11/2017 FINAL  Final     Labs: Basic Metabolic Panel: Recent Labs  Lab 10/09/17 2156 10/10/17 0423  NA 140 139  K 3.8 3.6  CL 104 105  CO2 26 24  GLUCOSE 140* 212*  BUN 13 13  CREATININE 0.97 0.92  CALCIUM 9.3 8.9  MG  --  1.5*   Liver Function Tests: Recent Labs  Lab 10/09/17 2156  AST 19  ALT 8*  ALKPHOS 67  BILITOT 0.2*  PROT 8.0  ALBUMIN 3.2*   Recent Labs  Lab 10/09/17 2156  LIPASE 39   No results for input(s): AMMONIA in the last 168 hours. CBC: Recent Labs  Lab 10/09/17 2156 10/10/17 0423 10/12/17 0431  WBC 15.3* 16.1* 10.9*  NEUTROABS 11.9*  --   --   HGB 12.3 11.9* 11.0*  HCT 36.0 34.8* 32.6*  MCV 76.1* 74.0* 76.9*  PLT 485* 472* 431*   Cardiac Enzymes: No results for input(s): CKTOTAL, CKMB, CKMBINDEX, TROPONINI in the last 168 hours. BNP: BNP (last 3 results) No results for input(s): BNP in the last 8760 hours.  ProBNP (last 3 results) No results for input(s): PROBNP in the last 8760 hours.  CBG: Recent Labs  Lab 10/12/17 1214 10/12/17 1744 10/12/17 2055 10/13/17 0734 10/13/17 1143  GLUCAP 159* 160* 234* 254* 79    Signed:  Velvet Bathe MD.  [facility] 10/13/2017, 12:06 PM

## 2017-11-02 IMAGING — CT CT ABD-PELV W/ CM
2 of 5 series · 17 of 46 positions shown, 19 images · IV contrast (APPLIED)
Comparison: 12/31/2013

CLINICAL DATA: Left lower quadrant pain for 1 week. Nausea and
vomiting.

EXAM:
CT ABDOMEN AND PELVIS WITH CONTRAST
TECHNIQUE: Multidetector CT imaging of the abdomen and pelvis was performed
using the standard protocol following bolus administration of
intravenous contrast.
CONTRAST:  100mL OMNIPAQUE IOHEXOL 300 MG/ML  SOLN

[Series 2: abd/ pelvis 5.0 i30f 1 · axial · 0.88mm/px · z∈[-714,-294]mm · 14 of 94 slices shown, 16 images]
[im 5/94  soft-tissue]
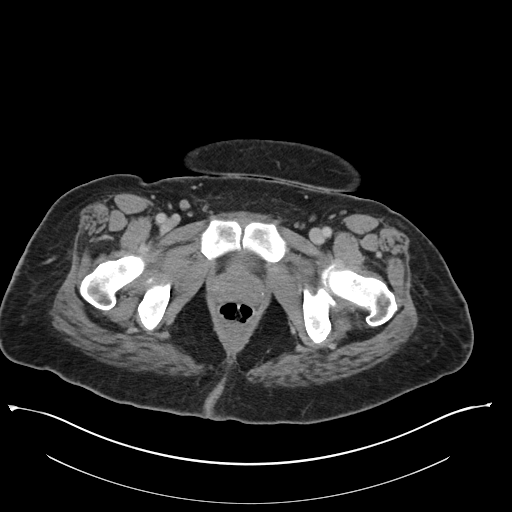
[im 5/94  bone]
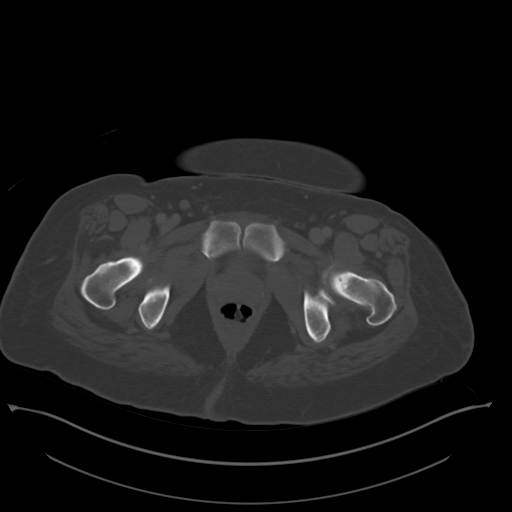
[im 14/94  soft-tissue]
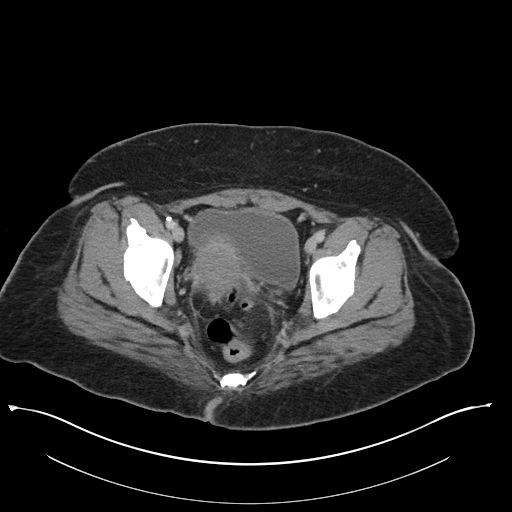
[im 19/94  soft-tissue]
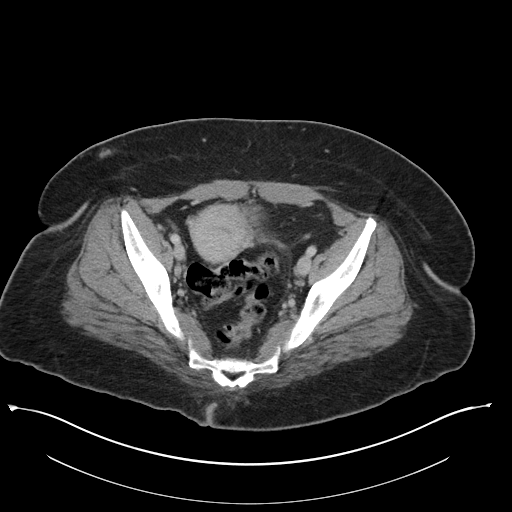
[im 24/94  soft-tissue]
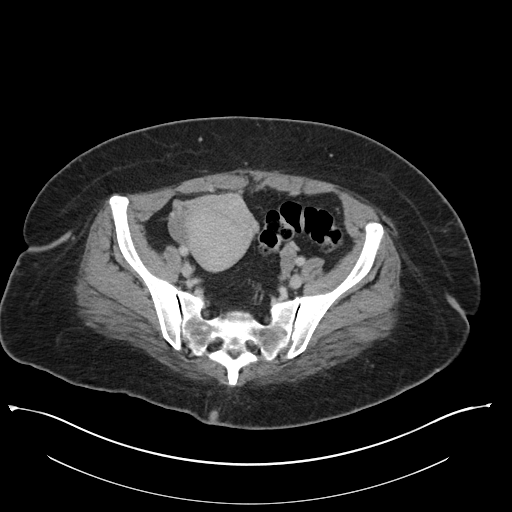
[im 33/94  soft-tissue]
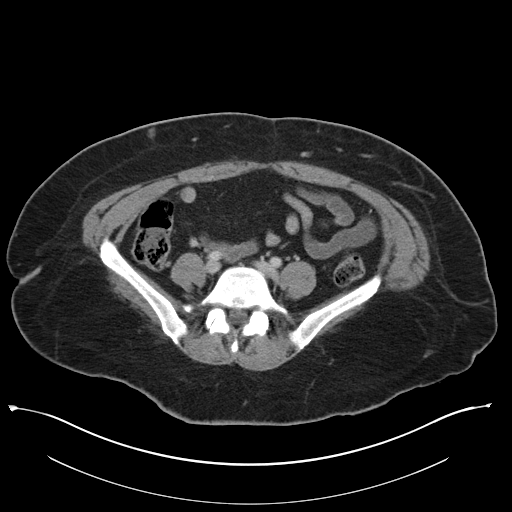
[im 38/94  soft-tissue]
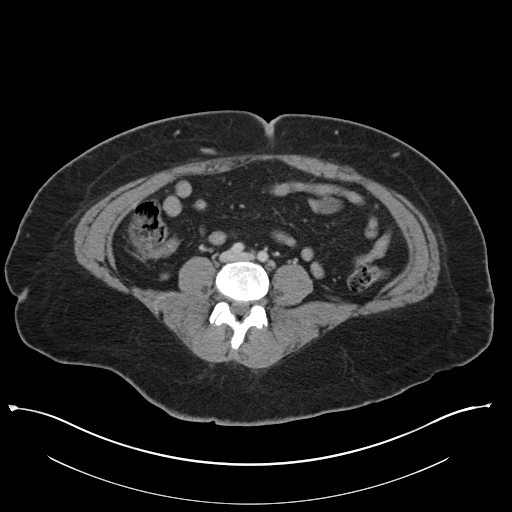
[im 42/94  soft-tissue]
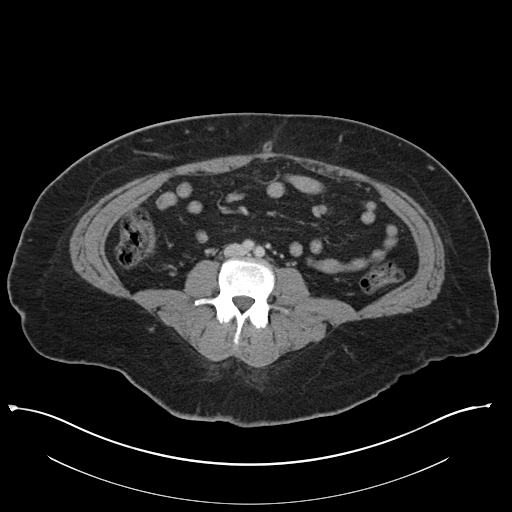
[im 52/94  soft-tissue]
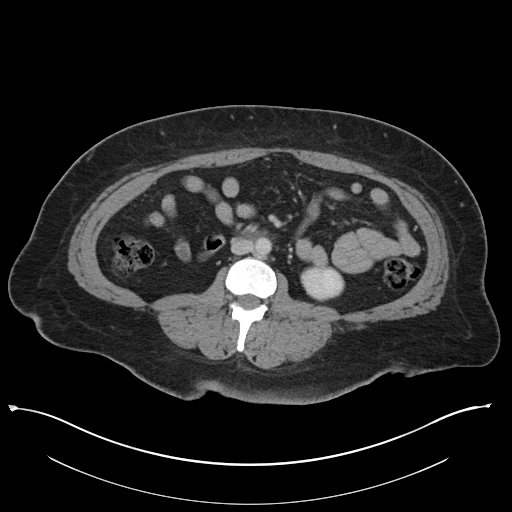
[im 56/94  soft-tissue]
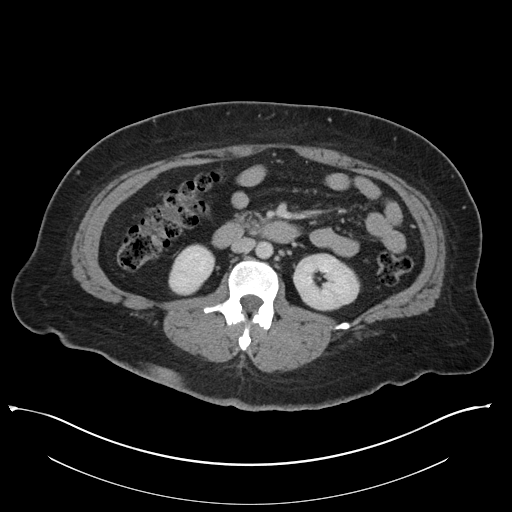
[im 56/94  bone]
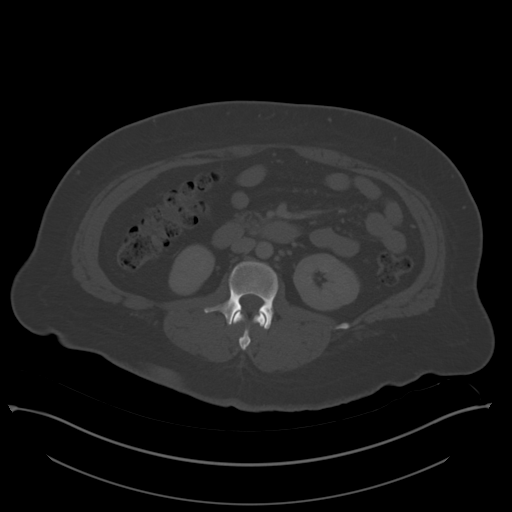
[im 61/94  soft-tissue]
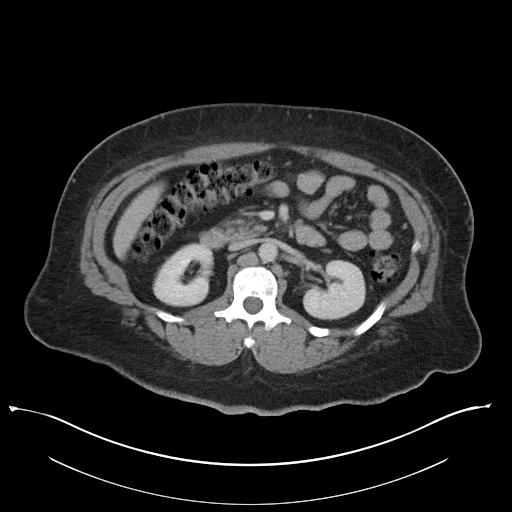
[im 70/94  soft-tissue]
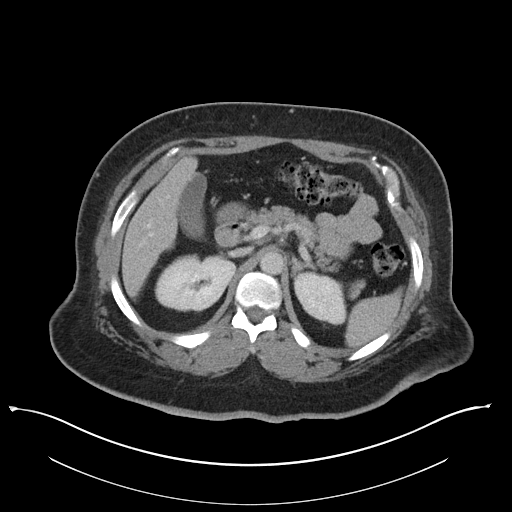
[im 75/94  soft-tissue]
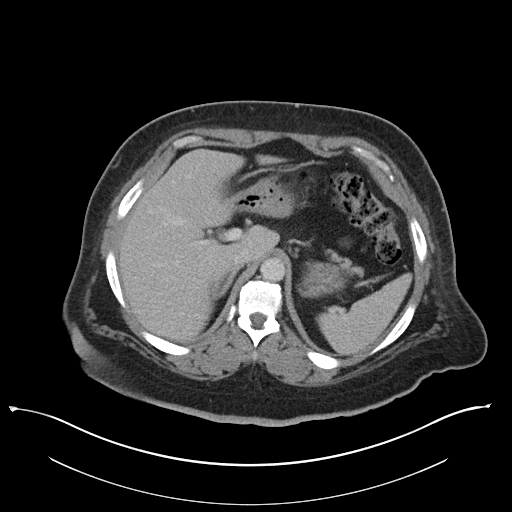
[im 80/94  soft-tissue]
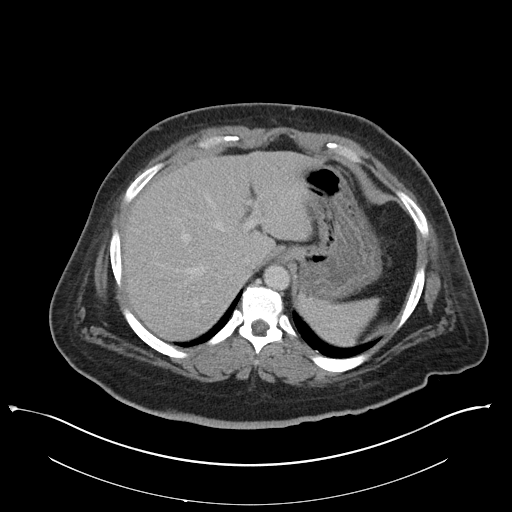
[im 89/94  soft-tissue]
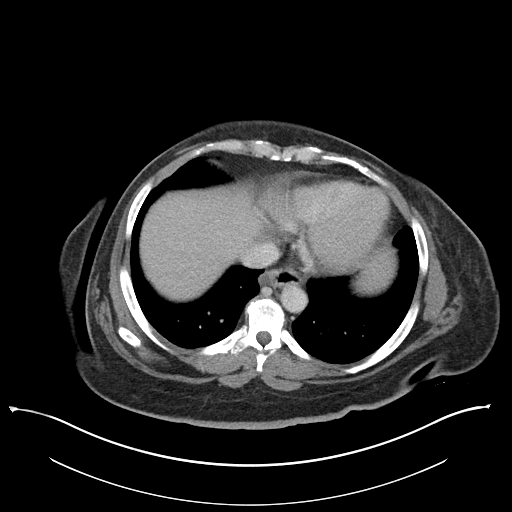

[Series 5: coronal soft tissue · coronal · 0.93mm/px · 3 of 84 slices shown]
[im 28/84  soft-tissue]
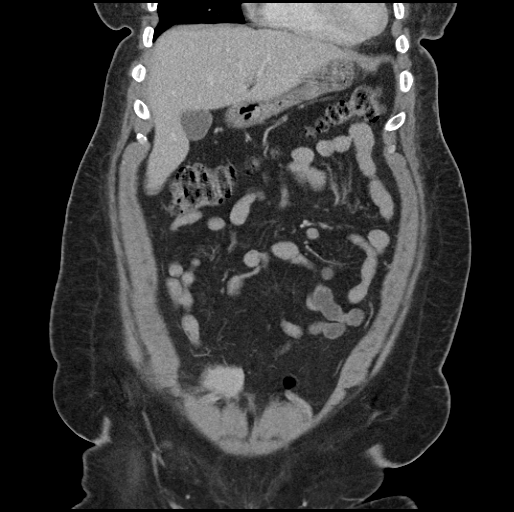
[im 37/84  soft-tissue]
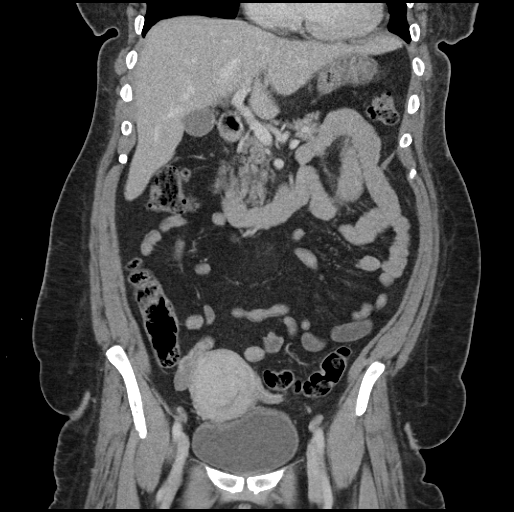
[im 47/84  soft-tissue]
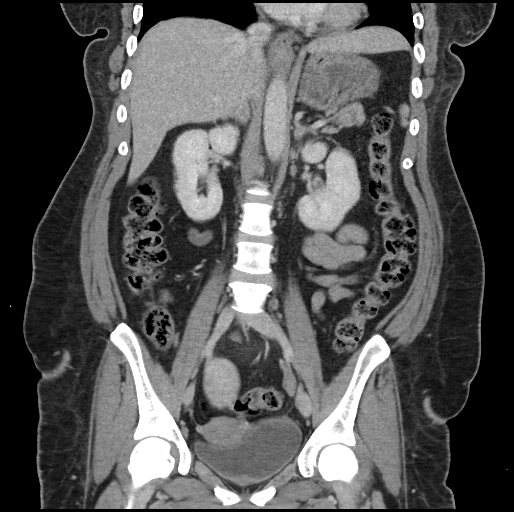

[17 of 46 positions shown; findings below may reference images not displayed]

FINDINGS: Lower chest:  No acute findings.

Hepatobiliary: No masses or other significant abnormality.
Gallbladder is unremarkable.

Pancreas: No mass, inflammatory changes, or other significant
abnormality.

Spleen: Within normal limits in size and appearance.

Adrenals/Urinary Tract: No masses identified. No evidence of
hydronephrosis.

Stomach/Bowel: No evidence of obstruction, inflammatory process, or
abnormal fluid collections. Normal appendix visualized.

Vascular/Lymphatic: No pathologically enlarged lymph nodes. No
evidence of abdominal aortic aneurysm.

Reproductive: Stable prominence of myometrium in the posterior
corpus which may represent a fibroid or adenomyosis.

Other: None.

Musculoskeletal:  No suspicious bone lesions identified.
IMPRESSION: No acute findings within the abdomen or pelvis.

Question posterior uterine fibroid versus adenomyosis. Consider
pelvic ultrasound for further evaluation if clinically warranted.

## 2017-11-15 ENCOUNTER — Encounter (HOSPITAL_COMMUNITY): Payer: Self-pay

## 2017-11-15 ENCOUNTER — Emergency Department (HOSPITAL_COMMUNITY)
Admission: EM | Admit: 2017-11-15 | Discharge: 2017-11-15 | Disposition: A | Discharge status: Home / Self Care | Payer: Medicaid Other | Attending: Emergency Medicine | Admitting: Emergency Medicine

## 2017-11-15 DIAGNOSIS — Z87891 Personal history of nicotine dependence: Secondary | ICD-10-CM | POA: Insufficient documentation

## 2017-11-15 DIAGNOSIS — I2510 Atherosclerotic heart disease of native coronary artery without angina pectoris: Secondary | ICD-10-CM | POA: Insufficient documentation

## 2017-11-15 DIAGNOSIS — I10 Essential (primary) hypertension: Secondary | ICD-10-CM | POA: Insufficient documentation

## 2017-11-15 DIAGNOSIS — I252 Old myocardial infarction: Secondary | ICD-10-CM | POA: Insufficient documentation

## 2017-11-15 DIAGNOSIS — K3184 Gastroparesis: Secondary | ICD-10-CM

## 2017-11-15 DIAGNOSIS — R112 Nausea with vomiting, unspecified: Secondary | ICD-10-CM | POA: Insufficient documentation

## 2017-11-15 DIAGNOSIS — Z79899 Other long term (current) drug therapy: Secondary | ICD-10-CM | POA: Insufficient documentation

## 2017-11-15 DIAGNOSIS — E119 Type 2 diabetes mellitus without complications: Secondary | ICD-10-CM | POA: Insufficient documentation

## 2017-11-15 DIAGNOSIS — Z794 Long term (current) use of insulin: Secondary | ICD-10-CM | POA: Insufficient documentation

## 2017-11-15 LAB — CBC
HCT: 40 % (ref 36.0–46.0)
Hemoglobin: 14.1 g/dL (ref 12.0–15.0)
MCH: 26.7 pg (ref 26.0–34.0)
MCHC: 35.3 g/dL (ref 30.0–36.0)
MCV: 75.8 fL — ABNORMAL LOW (ref 78.0–100.0)
PLATELETS: 426 10*3/uL — AB (ref 150–400)
RBC: 5.28 MIL/uL — ABNORMAL HIGH (ref 3.87–5.11)
RDW: 16.7 % — AB (ref 11.5–15.5)
WBC: 19.1 10*3/uL — ABNORMAL HIGH (ref 4.0–10.5)

## 2017-11-15 LAB — COMPREHENSIVE METABOLIC PANEL
ALBUMIN: 3.8 g/dL (ref 3.5–5.0)
ALT: 13 U/L (ref 0–44)
AST: 32 U/L (ref 15–41)
Alkaline Phosphatase: 68 U/L (ref 38–126)
Anion gap: 15 (ref 5–15)
BUN: 17 mg/dL (ref 6–20)
CHLORIDE: 107 mmol/L (ref 98–111)
CO2: 20 mmol/L — ABNORMAL LOW (ref 22–32)
Calcium: 10.1 mg/dL (ref 8.9–10.3)
Creatinine, Ser: 1.25 mg/dL — ABNORMAL HIGH (ref 0.44–1.00)
GFR calc Af Amer: 60 mL/min — ABNORMAL LOW (ref >60)
GFR calc non Af Amer: 52 mL/min — ABNORMAL LOW (ref >60)
Glucose, Bld: 233 mg/dL — ABNORMAL HIGH (ref 70–99)
POTASSIUM: 4.8 mmol/L (ref 3.5–5.1)
Sodium: 142 mmol/L (ref 135–145)
Total Bilirubin: 1 mg/dL (ref 0.3–1.2)
Total Protein: 8.7 g/dL — ABNORMAL HIGH (ref 6.5–8.1)

## 2017-11-15 LAB — LIPASE, BLOOD: LIPASE: 36 U/L (ref 11–51)

## 2017-11-15 LAB — CBG MONITORING, ED: GLUCOSE-CAPILLARY: 203 mg/dL — AB (ref 70–99)

## 2017-11-15 MED ORDER — LORAZEPAM 2 MG/ML IJ SOLN
INTRAMUSCULAR | Status: DC
Start: 2017-11-15 — End: 2017-11-15
  Filled 2017-11-15: qty 1

## 2017-11-15 MED ORDER — KETOROLAC TROMETHAMINE 30 MG/ML IJ SOLN
30.0000 mg | Freq: Once | INTRAMUSCULAR | Status: AC
  Administered 2017-11-15: 30 mg via INTRAVENOUS
  Filled 2017-11-15: qty 1

## 2017-11-15 MED ORDER — METOCLOPRAMIDE HCL 10 MG PO TABS: 10.0000 mg | ORAL_TABLET | Freq: Three times a day (TID) | ORAL | 0 refills | Status: DC

## 2017-11-15 MED ORDER — LORAZEPAM 2 MG/ML IJ SOLN
1.0000 mg | Freq: Once | INTRAMUSCULAR | Status: AC
  Administered 2017-11-15: 2 mg via INTRAVENOUS
  Filled 2017-11-15: qty 1

## 2017-11-15 MED ORDER — METOCLOPRAMIDE HCL 5 MG/ML IJ SOLN
10.0000 mg | Freq: Once | INTRAMUSCULAR | Status: AC
  Administered 2017-11-15: 10 mg via INTRAVENOUS
  Filled 2017-11-15: qty 2

## 2017-11-15 MED ORDER — SODIUM CHLORIDE 0.9 % IV BOLUS
1000.0000 mL | Freq: Once | INTRAVENOUS | Status: AC
  Administered 2017-11-15: 1000 mL via INTRAVENOUS

## 2017-11-15 MED ORDER — ACETAMINOPHEN 325 MG PO TABS
650.0000 mg | ORAL_TABLET | Freq: Once | ORAL | Status: AC
  Administered 2017-11-15: 650 mg via ORAL
  Filled 2017-11-15: qty 2

## 2017-11-15 MED ORDER — HALOPERIDOL LACTATE 5 MG/ML IJ SOLN
5.0000 mg | Freq: Once | INTRAMUSCULAR | Status: AC
  Administered 2017-11-15: 5 mg via INTRAMUSCULAR
  Filled 2017-11-15: qty 1

## 2017-11-15 MED ORDER — ONDANSETRON HCL 4 MG/2ML IJ SOLN
4.0000 mg | Freq: Once | INTRAMUSCULAR | Status: AC
  Administered 2017-11-15: 4 mg via INTRAVENOUS
  Filled 2017-11-15: qty 2

## 2017-11-15 NOTE — ED Notes (Signed)
Notifed Dr. Venora Maples of Pt's BP 186/102 and HR 124. No new orders at this time. Pt resting in stretcher

## 2017-11-15 NOTE — ED Notes (Addendum)
Patient discharged via wheelchair with belongings, sandals and scrub top to lobby to wait for ride.

## 2017-11-15 NOTE — ED Provider Notes (Signed)
[facility] DEPT Provider Note   CSN: [id number] Arrival date & time: 11/15/17  0201     History   Chief Complaint Chief Complaint  Patient presents with  . Abdominal Pain  . Nausea    Level 5 caveat: Uncooperative  HPI Shirley [name] is a 42 y.o. female.  HPI 42 year old female presents the emergency department with upper abdominal discomfort and nausea and vomiting which began yesterday morning.  It became more severe and she is been vomiting.  No blood in her vomit.  No reported fevers.  Patient is somewhat uncooperative on arrival to the emergency department as she is shaking from side to side.  Based on the charts this is a bit of a recurrent issue for her and how she responds to severe nausea.   Past Medical History:  Diagnosis Date  . Abscess of tunica vaginalis    10/09- Abundant S. aureus- sensitive to all abx  . Anxiety   . Blood dyscrasia   . CAD (coronary artery disease) 06/15/2006   s/p Subendocardial MI with PDA angioplasty(no stent) on 06/15/06 and relook  cath 06/19/06 showed patency of site. Cath 12/10- no restenosis or significant CAD progression  . CVA (cerebral vascular accident) ([place]) ~ 02/2014   denies residual on 04/22/2014  . CVA (cerebral vascular accident) [facility])    history of remote right cerebellar infarct noted on head CT at least since 10/2011  . Depression   . Diabetes mellitus type 2, uncontrolled, with complications ([place])   . Fibromyalgia   . Gastritis   . Gastroparesis    secondary to poorly controlled DM, last emptying study performed 01/2010  was normal but may be falsely positive as pt was on reglan  . GERD (gastroesophageal reflux disease)   . Hepatitis B, chronic (HCC)    Hep BeAb+,Hep B cAb+ & Hep BsAg+ (9/06)  . History of pyelonephritis    H/o GrpB Pyelonephritis (9/06) and UTI- 07/11- E.Coli, 12/10- GBS  . Hyperlipidemia   . Hypertension   . Iron deficiency anemia   . Irregular menses    Small  ovarian follicles seen on NL(9/76)  . MI (myocardial infarction) ([place]) 05/2006   PDA percutaneous transluminal coronary angioplasty  . Migraine    "weekly" (04/22/2014)  . N&V (nausea and vomiting)    Chronic. Unclear etiology with multiple admission and ED visits. CT abdomen with and without contrast (02/2011)  showed no acute process. Gastic Emptying scan (01/2010) was normal. Ultrasound of the abdomen was within normal limits. Hepatitis B viral load was undectable. HIV NR. EGD - gastritis, Hpylori + s/p Rx  . Obesity   . OSA (obstructive sleep apnea)    "suppose to wear mask but I don't" (04/22/2014)  . Peripheral neuropathy   . Pneumonia    "this is probably the 2nd or 3rd time I've had pneumonia" (04/22/2014)  . Recurrent boils   . Seasonal asthma   . Substance abuse ([place])   . Thrombocytosis ([place])    Hem/Onc suggested 2/2 chronic hepatits and/or iron deficiency anemia    Patient Active Problem List   Diagnosis Date Noted  . History of pulmonary embolism 10/10/2017  . Chronic anticoagulation 10/10/2017  . GERD (gastroesophageal reflux disease) 10/10/2017  . Leukocytosis 10/10/2017  . Prolonged QT interval 10/10/2017  . Nausea & vomiting 09/20/2017  . Hypertensive urgency 09/20/2017  . Intractable nausea and vomiting 08/08/2017  . Chronic maxillary sinusitis 01/02/2017  . Acute blood loss anemia 11/13/2016  . Menorrhagia  11/13/2016  . Bilateral pulmonary embolism ([place]) 10/30/2016  . Acute DVT (deep venous thrombosis) ([place]) 10/30/2016  . Nausea and vomiting 09/23/2016  . Type 2 diabetes mellitus with hyperglycemia ([place]) 04/07/2016  . Diabetic gastroparesis ([place]) 04/06/2016  . Dehydration 03/27/2015  . Gastroparesis 11/11/2014  . Hematemesis 10/13/2014  . DKA (diabetic ketoacidoses) ([place]) 10/13/2014  . Abdominal pain, chronic, left lower quadrant   . Diabetic gastroparesis associated with type 2 diabetes mellitus ([place]) 04/28/2014  . History of Helicobacter pylori  infection 04/22/2014  . Vaginal discharge 02/18/2014  . Unspecified constipation 07/21/2013  . Tinea corporis 07/21/2013  . Intractable vomiting 04/29/2013  . Dysmenorrhea 04/22/2013  . UTI (urinary tract infection) 07/15/2012  . Headache(784.0) 02/08/2012  . Health care maintenance 01/22/2012  . Chronic hepatitis B ([place]) 03/07/2011  . History of leukocytosis 04/06/2010  . THROMBOCYTOSIS 04/06/2010  . Polysubstance abuse ([place]) 02/23/2010  . Iron deficiency anemia 11/22/2009  . PERIPHERAL NEUROPATHY 10/01/2009  . Hyperlipidemia 08/30/2009  . Diabetic polyneuropathy ([place]) 08/30/2009  . Hidradenitis (recurrent boils) 07/07/2008  . Depression 12/27/2007  . Abdominal pain, left lower quadrant 11/21/2007  . FIBROMYALGIA 10/30/2007  . BACK PAIN 04/01/2007  . OBSTRUCTIVE SLEEP APNEA 01/17/2007  . ANXIETY DEPRESSION 06/27/2006  . Chronic ischemic heart disease 06/15/2006  . OBESITY, MORBID 05/15/2006  . MIGRAINE HEADACHE 05/15/2006  . Asthma 05/15/2006  . Essential hypertension 01/16/2006  . IRREGULAR MENSTRUATION 01/16/2006  . PEDAL EDEMA 01/16/2006  . Poorly controlled type 2 diabetes mellitus with peripheral neuropathy ([place]) 01/16/1989    Past Surgical History:  Procedure Laterality Date  . CESAREAN SECTION  1997  . CORONARY ANGIOPLASTY WITH STENT PLACEMENT  2008   "2 stents"  . ESOPHAGOGASTRODUODENOSCOPY N/A 04/23/2014   Procedure: ESOPHAGOGASTRODUODENOSCOPY (EGD);  Surgeon: Winfield Cunas., MD;  Location: [facility] ENDOSCOPY;  Service: Endoscopy;  Laterality: N/A;  . IR ANGIOGRAM PELVIS SELECTIVE OR SUPRASELECTIVE  12/07/2016  . IR ANGIOGRAM PELVIS SELECTIVE OR SUPRASELECTIVE  12/07/2016  . IR ANGIOGRAM SELECTIVE EACH ADDITIONAL VESSEL  12/07/2016  . IR ANGIOGRAM SELECTIVE EACH ADDITIONAL VESSEL  12/07/2016  . IR EMBO ARTERIAL NOT HEMORR HEMANG INC GUIDE ROADMAPPING  12/07/2016  . IR RADIOLOGIST EVAL & MGMT  01/09/2017  . IR US GUIDE VASC ACCESS RIGHT  12/07/2016     OB History      Gravida  2   Para  2   Term  2   Preterm      AB      Living        SAB      TAB      Ectopic      Multiple      Live Births  2            Home Medications    Prior to Admission medications   Medication Sig Start Date End Date Taking? Authorizing Provider  albuterol (PROVENTIL HFA;VENTOLIN HFA) 108 (90 Base) MCG/ACT inhaler Inhale 2 puffs into the lungs every 4 (four) hours as needed for wheezing or shortness of breath. 09/27/16   Clent Demark, PA-C  amLODipine (NORVASC) 10 MG tablet Take 1 tablet (10 mg total) by mouth daily. 09/24/17   Doreatha Lew, MD  apixaban (ELIQUIS) 5 MG TABS tablet Take 1 tablet (5 mg total) by mouth 2 (two) times daily. 08/13/17   Tawny Asal, MD  feeding supplement, GLUCERNA SHAKE, (GLUCERNA SHAKE) LIQD Take 237 mLs by mouth 3 (three) times daily between meals. 09/04/17   Colbert Ewing, MD  glucose blood (FREESTYLE TEST STRIPS) test strip Use as instructed Patient not taking: Reported on 09/26/2017 09/04/17   Colbert Ewing, MD  glucose monitoring kit (FREESTYLE) monitoring kit 1 each by Does not apply route 4 (four) times daily - after meals and at bedtime. 1 month Diabetic Testing Supplies for QAC-QHS accuchecks. Patient not taking: Reported on 09/26/2017 09/04/17   Colbert Ewing, MD  insulin NPH Human (HUMULIN N) 100 UNIT/ML injection Inject 0.12 mLs (12 Units total) into the skin 2 (two) times daily before a meal. 09/04/17   Colbert Ewing, MD  INSULIN SYRINGE .5CC/28G (INS SYRINGE/NEEDLE .5CC/28G) 28G X 1/2" 0.5 ML MISC Please provide 1 month supply Patient not taking: Reported on 09/26/2017 09/04/17   Colbert Ewing, MD  Lancets (FREESTYLE) lancets Use as instructed Patient not taking: Reported on 09/26/2017 09/04/17   Colbert Ewing, MD  metoCLOPramide (REGLAN) 10 MG tablet Take 1 tablet (10 mg total) by mouth 4 (four) times daily -  before meals and at bedtime. 11/15/17   [name], MD  metoprolol tartrate (LOPRESSOR) 100 MG  tablet Take 1 tablet (100 mg total) by mouth 2 (two) times daily. 08/13/17   Tawny Asal, MD  ondansetron (ZOFRAN) 4 MG tablet Take 1 tablet (4 mg total) by mouth every 6 (six) hours as needed for nausea. 09/23/17   Doreatha Lew, MD  pantoprazole (PROTONIX) 40 MG tablet Take 1 tablet (40 mg total) by mouth 2 (two) times daily. 09/23/17   Doreatha Lew, MD  traMADol (ULTRAM) 50 MG tablet Take 1 tablet (50 mg total) by mouth every 12 (twelve) hours as needed. 09/23/17   Doreatha Lew, MD    Family History Family History  Problem Relation Age of Onset  . Diabetes Father     Social History Social History   Tobacco Use  . Smoking status: Former Smoker    Types: Cigarettes    Last attempt to quit: 04/24/1996    Years since quitting: 21.5  . Smokeless tobacco: Never Used  . Tobacco comment: quit smoking cigarettes age 50  Substance Use Topics  . Alcohol use: No    Alcohol/week: 0.0 oz    Comment: 04/22/2014 "might have a few drinks a month"  . Drug use: Not Currently    Types: Marijuana, Cocaine    Comment: 04/22/2104 "quit drugs ~ 1-2 yr ago"     Allergies   Lisinopril and Morphine and related   Review of Systems Review of Systems  Unable to perform ROS: Other     Physical Exam Updated Vital Signs BP (!) 186/102   Pulse (!) 130   Temp 98.9 F (37.2 C) (Oral)   Resp 16   Ht 5' 6"  (1.676 m)   Wt 97.1 kg (214 lb)   SpO2 100%   BMI 34.54 kg/m   Physical Exam  Constitutional: She is oriented to person, place, and time. She appears well-developed and well-nourished. No distress.  HENT:  Head: Normocephalic and atraumatic.  Eyes: EOM are normal.  Neck: Normal range of motion.  Cardiovascular: Regular rhythm.  Tachycardia  Pulmonary/Chest: Effort normal and breath sounds normal.  Abdominal: Soft. She exhibits no distension. There is no tenderness.  Musculoskeletal: Normal range of motion.  Neurological: She is alert and oriented to person, place, and  time.  Skin: Skin is warm and dry.  Psychiatric:  Anxious   Nursing note and vitals reviewed.    ED Treatments / Results  Labs (all labs ordered are listed, but only abnormal  results are displayed) Labs Reviewed  COMPREHENSIVE METABOLIC PANEL - Abnormal; Notable for the following components:      Result Value   CO2 20 (*)    Glucose, Bld 233 (*)    Creatinine, Ser 1.25 (*)    Total Protein 8.7 (*)    GFR calc non Af Amer 52 (*)    GFR calc Af Amer 60 (*)    All other components within normal limits  CBC - Abnormal; Notable for the following components:   WBC 19.1 (*)    RBC 5.28 (*)    MCV 75.8 (*)    RDW 16.7 (*)    Platelets 426 (*)    All other components within normal limits  CBG MONITORING, ED - Abnormal; Notable for the following components:   Glucose-Capillary 203 (*)    All other components within normal limits  LIPASE, BLOOD    EKG None  Radiology No results found.  Procedures Procedures (including critical care time)  Medications Ordered in ED Medications  LORazepam (ATIVAN) injection 1 mg (2 mg Intravenous Given 11/15/17 0219)  ondansetron (ZOFRAN) injection 4 mg (4 mg Intravenous Given 11/15/17 0300)  metoCLOPramide (REGLAN) injection 10 mg (10 mg Intravenous Given 11/15/17 0300)  sodium chloride 0.9 % bolus 1,000 mL (0 mLs Intravenous Stopped 11/15/17 0558)  haloperidol lactate (HALDOL) injection 5 mg (5 mg Intramuscular Given 11/15/17 0253)  sodium chloride 0.9 % bolus 1,000 mL (0 mLs Intravenous Stopped 11/15/17 0654)     Initial Impression / Assessment and Plan / ED Course  I have reviewed the triage vital signs and the nursing notes.  Pertinent labs & imaging results that were available during my care of the patient were reviewed by me and considered in my medical decision making (see chart for details).    Patient's labs are reassuring.  Nonspecific elevated white blood cell count.  Repeat abdominal exam x3 without peritoneal signs or acute  abdominal tenderness.  I do not think she needs advanced imaging at this time.  Likely gastroparesis.  IV fluids given.  No vomiting while in the emergency department.  Patient will need outpatient primary care and GI follow-up.  She is encouraged to return to the ER for new or worsening symptoms   Final Clinical Impressions(s) / ED Diagnoses   Final diagnoses:  Gastroparesis    ED Discharge Orders        Ordered    metoCLOPramide (REGLAN) 10 MG tablet  3 times daily before meals & bedtime     11/15/17 0706       [name], MD 11/15/17 [phone]

## 2017-11-15 NOTE — ED Triage Notes (Signed)
Pt arrived by POV from home. Pt's spouse reports that Pt has stomach cancer but is not getting any sort of treatment at this time. Pt reports pain that started yesterday morning and became severe an hour to arrival. Pt has been vomiting, Pt is thrashing around bed and being uncooperative at this time.

## 2017-11-20 IMAGING — DX DG KNEE COMPLETE 4+V*R*
4 series · 4 of 4 positions shown · non-contrast
Comparison: None.

CLINICAL DATA: [name] right knee popped last night while
getting into bed. Pain. Initial encounter.

EXAM:
RIGHT KNEE - COMPLETE 4+ VIEW

[knee ap]
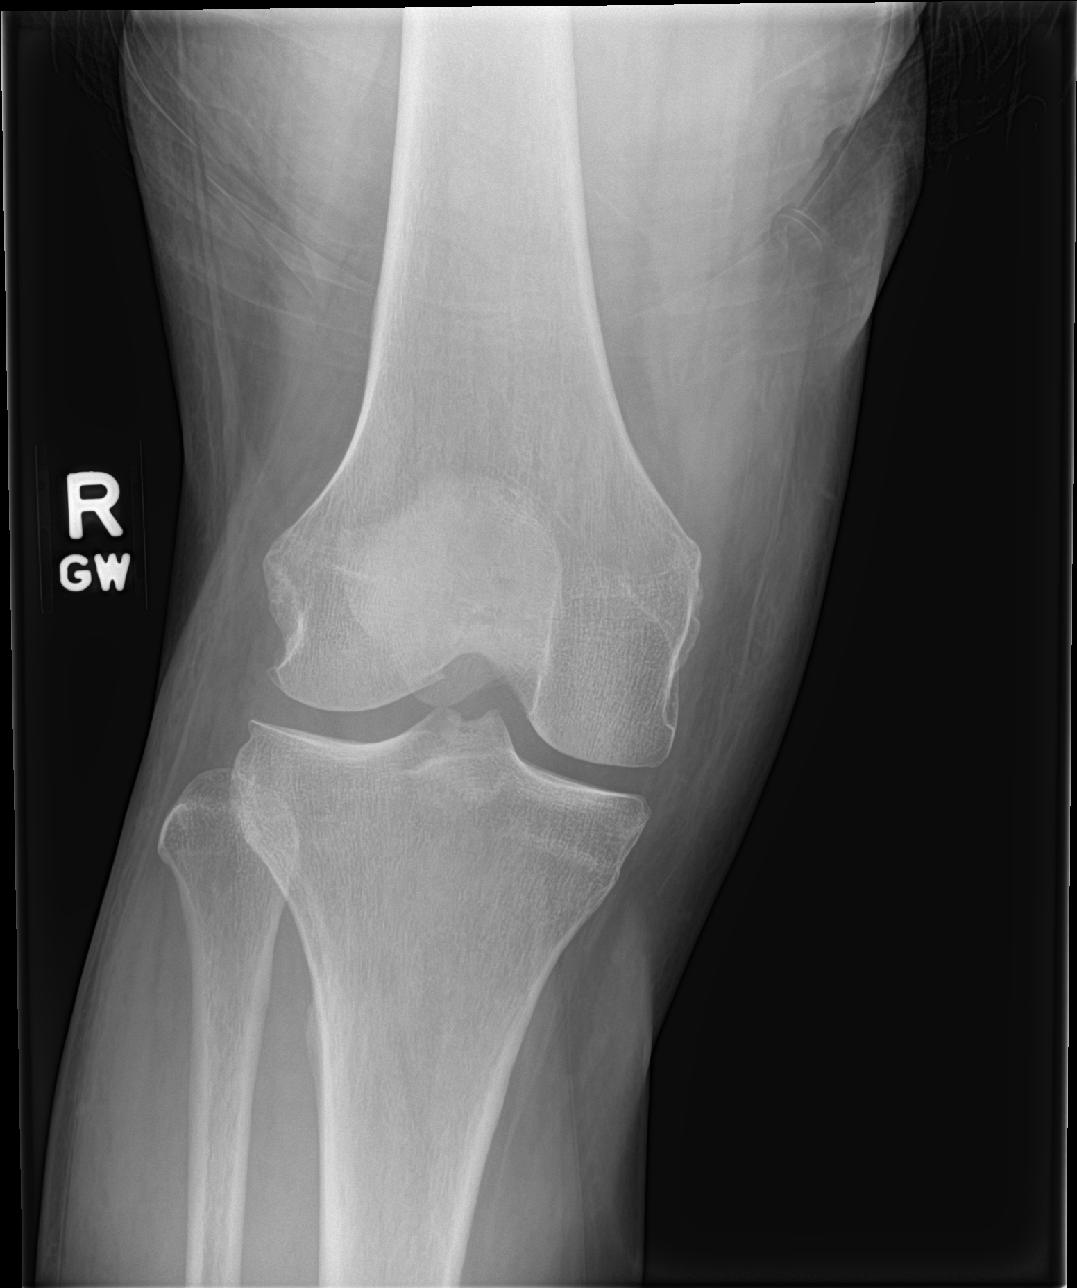

[knee lat]
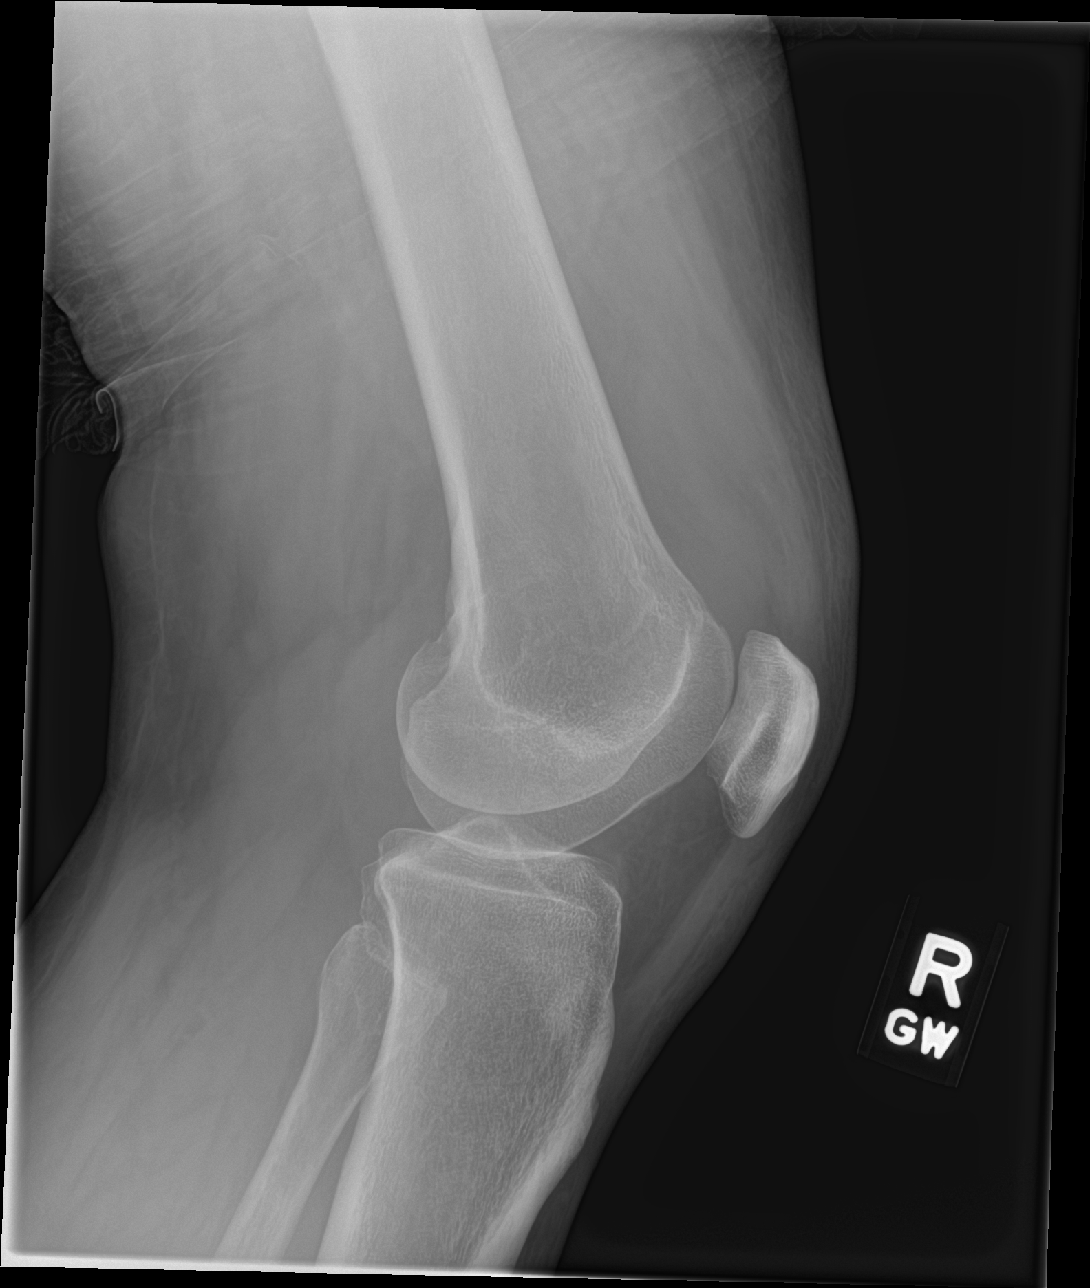

[knee obl (1 of 2)]
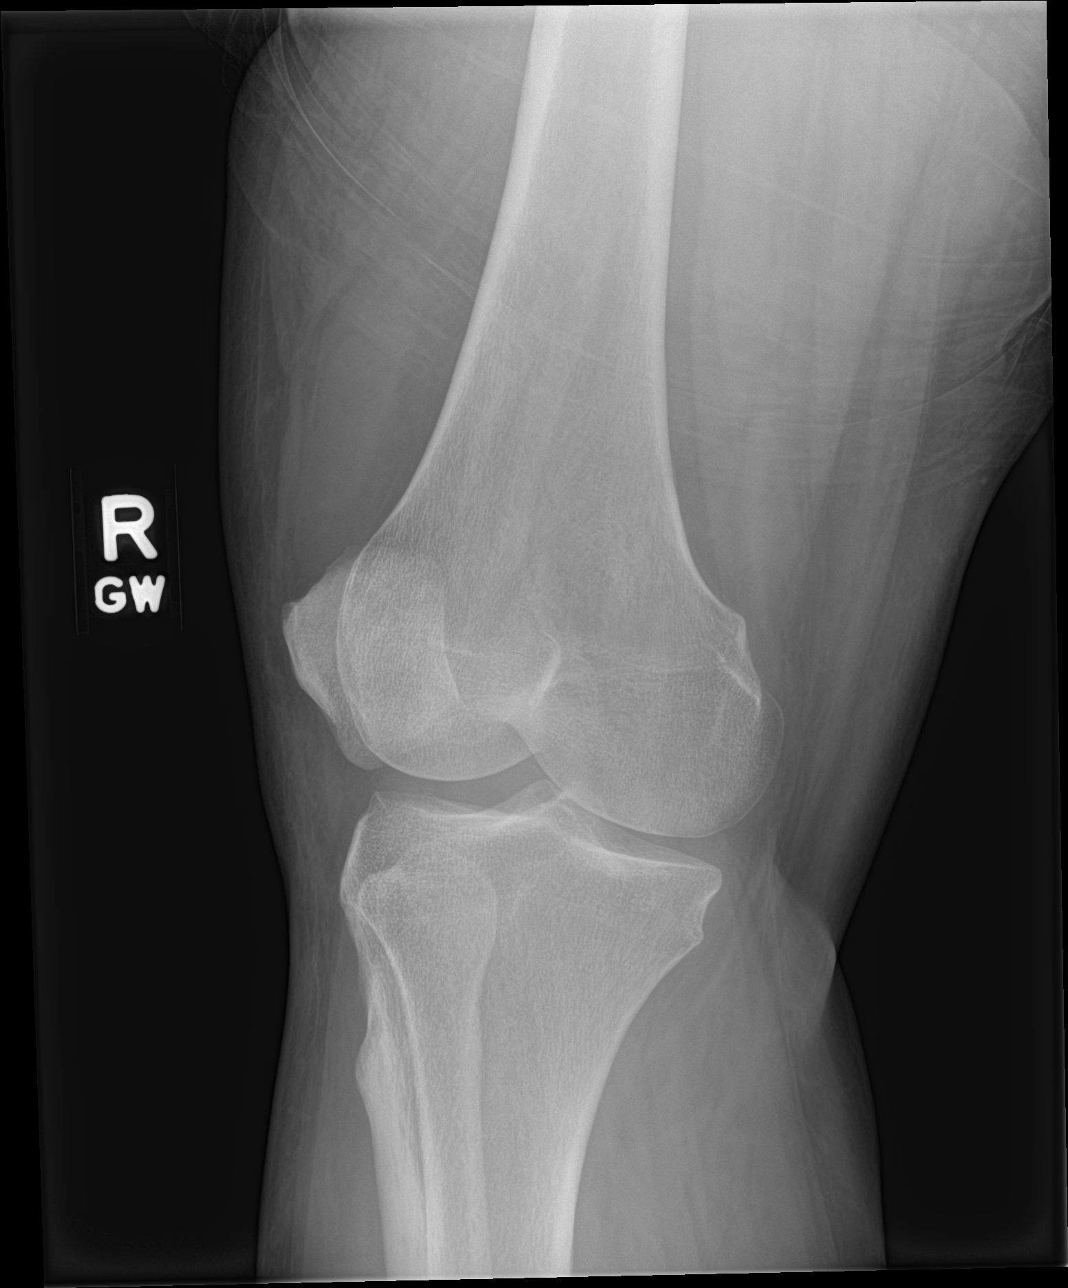

[knee obl (2 of 2)]
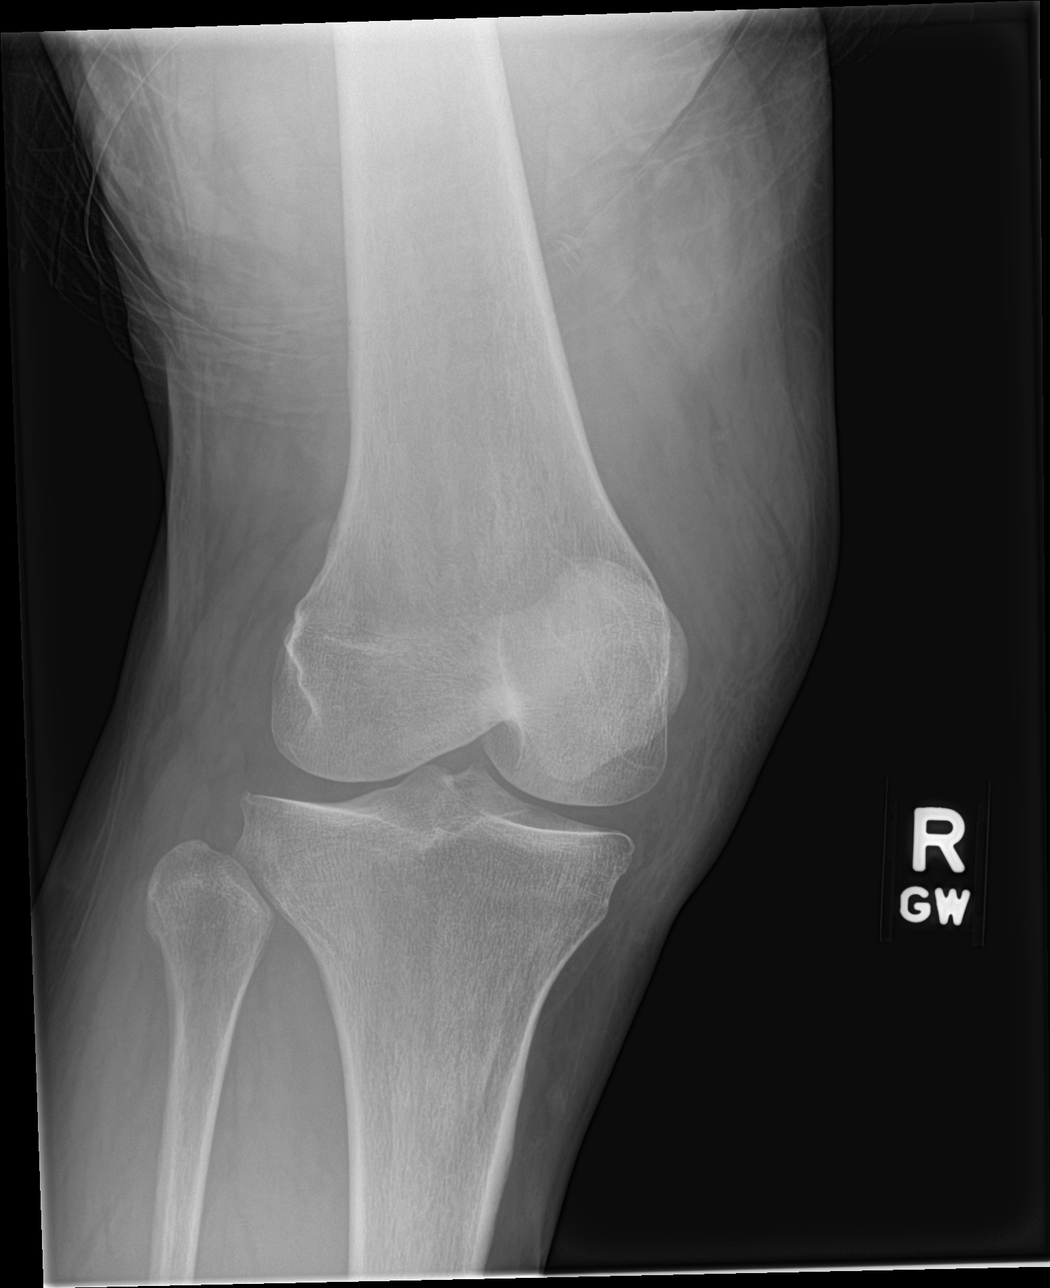

[4 of 4 positions shown; findings below may reference images not displayed]

FINDINGS: Moderate joint effusion is seen. No bony abnormality is identified.
Joint spaces are preserved.
IMPRESSION: Moderate joint effusion.  Otherwise negative.

## 2017-12-05 ENCOUNTER — Emergency Department (HOSPITAL_COMMUNITY): Payer: Medicaid Other

## 2017-12-05 ENCOUNTER — Telehealth: Payer: Self-pay | Admitting: Obstetrics & Gynecology

## 2017-12-05 ENCOUNTER — Emergency Department (HOSPITAL_COMMUNITY)
Admission: EM | Admit: 2017-12-05 | Discharge: 2017-12-05 | Disposition: A | Discharge status: Home / Self Care | Payer: Medicaid Other | Attending: Emergency Medicine | Admitting: Emergency Medicine

## 2017-12-05 ENCOUNTER — Telehealth (HOSPITAL_COMMUNITY): Payer: Self-pay

## 2017-12-05 ENCOUNTER — Encounter (HOSPITAL_COMMUNITY): Payer: Self-pay

## 2017-12-05 DIAGNOSIS — Z87891 Personal history of nicotine dependence: Secondary | ICD-10-CM | POA: Insufficient documentation

## 2017-12-05 DIAGNOSIS — R1013 Epigastric pain: Secondary | ICD-10-CM | POA: Diagnosis present

## 2017-12-05 DIAGNOSIS — I2510 Atherosclerotic heart disease of native coronary artery without angina pectoris: Secondary | ICD-10-CM | POA: Diagnosis not present

## 2017-12-05 DIAGNOSIS — E119 Type 2 diabetes mellitus without complications: Secondary | ICD-10-CM | POA: Diagnosis not present

## 2017-12-05 DIAGNOSIS — I10 Essential (primary) hypertension: Secondary | ICD-10-CM | POA: Insufficient documentation

## 2017-12-05 DIAGNOSIS — E1143 Type 2 diabetes mellitus with diabetic autonomic (poly)neuropathy: Secondary | ICD-10-CM | POA: Diagnosis not present

## 2017-12-05 LAB — COMPREHENSIVE METABOLIC PANEL
ALBUMIN: 3.2 g/dL — AB (ref 3.5–5.0)
ALK PHOS: 66 U/L (ref 38–126)
ALT: 10 U/L (ref 0–44)
ANION GAP: 9 (ref 5–15)
AST: 19 U/L (ref 15–41)
BILIRUBIN TOTAL: 0.4 mg/dL (ref 0.3–1.2)
BUN: 16 mg/dL (ref 6–20)
CALCIUM: 9.3 mg/dL (ref 8.9–10.3)
CO2: 24 mmol/L (ref 22–32)
Chloride: 105 mmol/L (ref 98–111)
Creatinine, Ser: 1.29 mg/dL — ABNORMAL HIGH (ref 0.44–1.00)
GFR calc Af Amer: 57 mL/min — ABNORMAL LOW (ref >60)
GFR calc non Af Amer: 50 mL/min — ABNORMAL LOW (ref >60)
GLUCOSE: 342 mg/dL — AB (ref 70–99)
POTASSIUM: 4 mmol/L (ref 3.5–5.1)
SODIUM: 138 mmol/L (ref 135–145)
TOTAL PROTEIN: 7.6 g/dL (ref 6.5–8.1)

## 2017-12-05 LAB — I-STAT BETA HCG BLOOD, ED (MC, WL, AP ONLY): I-stat hCG, quantitative: <5 m[IU]/mL (ref <5)

## 2017-12-05 LAB — CBC WITH DIFFERENTIAL/PLATELET
ABS IMMATURE GRANULOCYTES: 0 10*3/uL (ref 0.0–0.1)
Basophils Absolute: 0.1 10*3/uL (ref 0.0–0.1)
Basophils Relative: 0 %
Eosinophils Absolute: 0.3 10*3/uL (ref 0.0–0.7)
Eosinophils Relative: 2 %
HEMATOCRIT: 35.4 % — AB (ref 36.0–46.0)
HEMOGLOBIN: 11.8 g/dL — AB (ref 12.0–15.0)
IMMATURE GRANULOCYTES: 0 %
LYMPHS ABS: 2.6 10*3/uL (ref 0.7–4.0)
Lymphocytes Relative: 19 %
MCH: 25.3 pg — AB (ref 26.0–34.0)
MCHC: 33.3 g/dL (ref 30.0–36.0)
MCV: 76 fL — AB (ref 78.0–100.0)
MONO ABS: 0.8 10*3/uL (ref 0.1–1.0)
MONOS PCT: 6 %
NEUTROS ABS: 10.5 10*3/uL — AB (ref 1.7–7.7)
NEUTROS PCT: 73 %
Platelets: 438 10*3/uL — ABNORMAL HIGH (ref 150–400)
RBC: 4.66 MIL/uL (ref 3.87–5.11)
RDW: 16.3 % — ABNORMAL HIGH (ref 11.5–15.5)
WBC: 14.3 10*3/uL — ABNORMAL HIGH (ref 4.0–10.5)

## 2017-12-05 LAB — LIPASE, BLOOD: Lipase: 33 U/L (ref 11–51)

## 2017-12-05 LAB — TROPONIN I: Troponin I: 0.03 ng/mL (ref <0.03)

## 2017-12-05 MED ORDER — METOCLOPRAMIDE HCL 5 MG/ML IJ SOLN
10.0000 mg | Freq: Once | INTRAMUSCULAR | Status: AC
  Administered 2017-12-05: 10 mg via INTRAVENOUS
  Filled 2017-12-05: qty 2

## 2017-12-05 MED ORDER — SODIUM CHLORIDE 0.9 % IV BOLUS
1000.0000 mL | Freq: Once | INTRAVENOUS | Status: AC
  Administered 2017-12-05: 1000 mL via INTRAVENOUS

## 2017-12-05 MED ORDER — LORAZEPAM 2 MG/ML IJ SOLN
2.0000 mg | Freq: Once | INTRAMUSCULAR | Status: AC
Start: 2017-12-05 — End: 2017-12-05
  Administered 2017-12-05: 2 mg via INTRAVENOUS
  Filled 2017-12-05: qty 1

## 2017-12-05 MED ORDER — FENTANYL CITRATE (PF) 100 MCG/2ML IJ SOLN
50.0000 ug | Freq: Once | INTRAMUSCULAR | Status: AC
  Administered 2017-12-05: 50 ug via INTRAVENOUS
  Filled 2017-12-05: qty 2

## 2017-12-05 NOTE — Telephone Encounter (Signed)
Patient is calling to see when her surgery date is

## 2017-12-05 NOTE — ED Provider Notes (Signed)
Emergency Department Provider Note   I have reviewed the triage vital signs and the nursing notes.   HISTORY  Chief Complaint Abdominal Pain   HPI Shirley [name] is a 42 y.o. female with PMH of CAD, CVA, DM, GERD, Hep B, and Gastroparesis presents to the ED for evaluation of epigastric abdominal pain vomiting which began yesterday.  Patient states this feels similar to her gastroparesis episodes.  She has some mild chest pain but denies shortness of breath or palpitations.  Denies fevers or chills.  No associated diarrhea.  Pain is severe, epigastric, non-radiating.    Past Medical History:  Diagnosis Date  . Abscess of tunica vaginalis    10/09- Abundant S. aureus- sensitive to all abx  . Anxiety   . Blood dyscrasia   . CAD (coronary artery disease) 06/15/2006   s/p Subendocardial MI with PDA angioplasty(no stent) on 06/15/06 and relook  cath 06/19/06 showed patency of site. Cath 12/10- no restenosis or significant CAD progression  . CVA (cerebral vascular accident) ([place]) ~ 02/2014   denies residual on 04/22/2014  . CVA (cerebral vascular accident) [facility])    history of remote right cerebellar infarct noted on head CT at least since 10/2011  . Depression   . Diabetes mellitus type 2, uncontrolled, with complications ([place])   . Fibromyalgia   . Gastritis   . Gastroparesis    secondary to poorly controlled DM, last emptying study performed 01/2010  was normal but may be falsely positive as pt was on reglan  . GERD (gastroesophageal reflux disease)   . Hepatitis B, chronic (HCC)    Hep BeAb+,Hep B cAb+ & Hep BsAg+ (9/06)  . History of pyelonephritis    H/o GrpB Pyelonephritis (9/06) and UTI- 07/11- E.Coli, 12/10- GBS  . Hyperlipidemia   . Hypertension   . Iron deficiency anemia   . Irregular menses    Small ovarian follicles seen on NK(5/39)  . MI (myocardial infarction) ([place]) 05/2006   PDA percutaneous transluminal coronary angioplasty  . Migraine    "weekly"  (04/22/2014)  . N&V (nausea and vomiting)    Chronic. Unclear etiology with multiple admission and ED visits. CT abdomen with and without contrast (02/2011)  showed no acute process. Gastic Emptying scan (01/2010) was normal. Ultrasound of the abdomen was within normal limits. Hepatitis B viral load was undectable. HIV NR. EGD - gastritis, Hpylori + s/p Rx  . Obesity   . OSA (obstructive sleep apnea)    "suppose to wear mask but I don't" (04/22/2014)  . Peripheral neuropathy   . Pneumonia    "this is probably the 2nd or 3rd time I've had pneumonia" (04/22/2014)  . Recurrent boils   . Seasonal asthma   . Substance abuse ([place])   . Thrombocytosis ([place])    Hem/Onc suggested 2/2 chronic hepatits and/or iron deficiency anemia    Patient Active Problem List   Diagnosis Date Noted  . History of pulmonary embolism 10/10/2017  . Chronic anticoagulation 10/10/2017  . GERD (gastroesophageal reflux disease) 10/10/2017  . Leukocytosis 10/10/2017  . Prolonged QT interval 10/10/2017  . Nausea & vomiting 09/20/2017  . Hypertensive urgency 09/20/2017  . Intractable nausea and vomiting 08/08/2017  . Chronic maxillary sinusitis 01/02/2017  . Acute blood loss anemia 11/13/2016  . Menorrhagia 11/13/2016  . Bilateral pulmonary embolism ([place]) 10/30/2016  . Acute DVT (deep venous thrombosis) ([place]) 10/30/2016  . Nausea and vomiting 09/23/2016  . Type 2 diabetes mellitus with hyperglycemia ([place]) 04/07/2016  . Diabetic  gastroparesis ([place]) 04/06/2016  . Dehydration 03/27/2015  . Gastroparesis 11/11/2014  . Hematemesis 10/13/2014  . DKA (diabetic ketoacidoses) ([place]) 10/13/2014  . Abdominal pain, chronic, left lower quadrant   . Diabetic gastroparesis associated with type 2 diabetes mellitus ([place]) 04/28/2014  . History of Helicobacter pylori infection 04/22/2014  . Vaginal discharge 02/18/2014  . Unspecified constipation 07/21/2013  . Tinea corporis 07/21/2013  . Intractable vomiting 04/29/2013  .  Dysmenorrhea 04/22/2013  . UTI (urinary tract infection) 07/15/2012  . Headache(784.0) 02/08/2012  . Health care maintenance 01/22/2012  . Chronic hepatitis B ([place]) 03/07/2011  . History of leukocytosis 04/06/2010  . THROMBOCYTOSIS 04/06/2010  . Polysubstance abuse ([place]) 02/23/2010  . Iron deficiency anemia 11/22/2009  . PERIPHERAL NEUROPATHY 10/01/2009  . Hyperlipidemia 08/30/2009  . Diabetic polyneuropathy ([place]) 08/30/2009  . Hidradenitis (recurrent boils) 07/07/2008  . Depression 12/27/2007  . Abdominal pain, left lower quadrant 11/21/2007  . FIBROMYALGIA 10/30/2007  . BACK PAIN 04/01/2007  . OBSTRUCTIVE SLEEP APNEA 01/17/2007  . ANXIETY DEPRESSION 06/27/2006  . Chronic ischemic heart disease 06/15/2006  . OBESITY, MORBID 05/15/2006  . MIGRAINE HEADACHE 05/15/2006  . Asthma 05/15/2006  . Essential hypertension 01/16/2006  . IRREGULAR MENSTRUATION 01/16/2006  . PEDAL EDEMA 01/16/2006  . Poorly controlled type 2 diabetes mellitus with peripheral neuropathy ([place]) 01/16/1989    Past Surgical History:  Procedure Laterality Date  . CESAREAN SECTION  1997  . CORONARY ANGIOPLASTY WITH STENT PLACEMENT  2008   "2 stents"  . ESOPHAGOGASTRODUODENOSCOPY N/A 04/23/2014   Procedure: ESOPHAGOGASTRODUODENOSCOPY (EGD);  Surgeon: Winfield Cunas., MD;  Location: [facility] ENDOSCOPY;  Service: Endoscopy;  Laterality: N/A;  . IR ANGIOGRAM PELVIS SELECTIVE OR SUPRASELECTIVE  12/07/2016  . IR ANGIOGRAM PELVIS SELECTIVE OR SUPRASELECTIVE  12/07/2016  . IR ANGIOGRAM SELECTIVE EACH ADDITIONAL VESSEL  12/07/2016  . IR ANGIOGRAM SELECTIVE EACH ADDITIONAL VESSEL  12/07/2016  . IR EMBO ARTERIAL NOT HEMORR HEMANG INC GUIDE ROADMAPPING  12/07/2016  . IR RADIOLOGIST EVAL & MGMT  01/09/2017  . IR US GUIDE VASC ACCESS RIGHT  12/07/2016    Allergies Lisinopril and Morphine and related  Family History  Problem Relation Age of Onset  . Diabetes Father     Social History Social History   Tobacco Use  .  Smoking status: Former Smoker    Types: Cigarettes    Last attempt to quit: 04/24/1996    Years since quitting: 21.6  . Smokeless tobacco: Never Used  . Tobacco comment: quit smoking cigarettes age 32  Substance Use Topics  . Alcohol use: No    Alcohol/week: 0.0 standard drinks    Comment: 04/22/2014 "might have a few drinks a month"  . Drug use: Not Currently    Types: Marijuana, Cocaine    Comment: 04/22/2104 "quit drugs ~ 1-2 yr ago"    Review of Systems  Constitutional: No fever/chills Eyes: No visual changes. ENT: No sore throat. Cardiovascular: Positive chest pain. Respiratory: Denies shortness of breath. Gastrointestinal: Positive epigastric abdominal pain. Positive nausea and vomiting.  No diarrhea.  No constipation. Genitourinary: Negative for dysuria. Musculoskeletal: Negative for back pain. Skin: Negative for rash. Neurological: Negative for headaches, focal weakness or numbness.  10-point ROS otherwise negative.  ____________________________________________   PHYSICAL EXAM:  VITAL SIGNS: ED Triage Vitals  Enc Vitals Group     BP 12/05/17 1231 (!) 218/136     Pulse Rate 12/05/17 1231 (!) 143     Resp --      Temp 12/05/17 1236 100.1 F (37.8 C)  Temp Source 12/05/17 1236 Oral     SpO2 12/05/17 1225 94 %     Weight 12/05/17 1230 215 lb 6.2 oz (97.7 kg)     Height 12/05/17 1230 5\' 6"  (1.676 m)     Pain Score 12/05/17 1229 10   Constitutional: Patient alert and providing a history but appears very uncomfortable.  Eyes: Conjunctivae are normal.  Head: Atraumatic. Nose: No congestion/rhinnorhea. Mouth/Throat: Mucous membranes are moist.  Neck: No stridor.   Cardiovascular: Tachycardia. Good peripheral circulation. Grossly normal heart sounds.   Respiratory: Normal respiratory effort.  No retractions. Lungs CTAB. Gastrointestinal: Soft with positive epigastric pain. No distention.  Musculoskeletal: No lower extremity tenderness nor edema. No gross  deformities of extremities. Neurologic:  Normal speech and language. No gross focal neurologic deficits are appreciated.  Skin:  Skin is warm, dry and intact. No rash noted.  ____________________________________________   LABS (all labs ordered are listed, but only abnormal results are displayed)  Labs Reviewed  COMPREHENSIVE METABOLIC PANEL - Abnormal; Notable for the following components:      Result Value   Glucose, Bld 342 (*)    Creatinine, Ser 1.29 (*)    Albumin 3.2 (*)    GFR calc non Af Amer 50 (*)    GFR calc Af Amer 57 (*)    All other components within normal limits  CBC WITH DIFFERENTIAL/PLATELET - Abnormal; Notable for the following components:   WBC 14.3 (*)    Hemoglobin 11.8 (*)    HCT 35.4 (*)    MCV 76.0 (*)    MCH 25.3 (*)    RDW 16.3 (*)    Platelets 438 (*)    Neutro Abs 10.5 (*)    All other components within normal limits  TROPONIN I - Abnormal; Notable for the following components:   Troponin I 0.03 (*)    All other components within normal limits  LIPASE, BLOOD  TROPONIN I  I-STAT BETA HCG BLOOD, ED ([facility], [facility], [facility] ONLY)   ____________________________________________  EKG   EKG Interpretation  Date/Time:  Wednesday December 05 2017 12:32:50 EDT Ventricular Rate:  139 PR Interval:    QRS Duration: 83 QT Interval:  362 QTC Calculation: 551 R Axis:   75 Text Interpretation:  Sinus tachycardia Nonspecific repol abnormality, lateral leads Prolonged QT interval No STEMI.  Confirmed by Nanda Quinton [phone]) on 12/05/2017 12:50:25 PM       ____________________________________________  RADIOLOGY  Dg Abdomen Acute W/chest  Result Date: 12/05/2017 CLINICAL DATA:  Chest pain and abdominal pain. EXAM: DG ABDOMEN ACUTE W/ 1V CHEST COMPARISON:  10/09/2017 FINDINGS: Chest view demonstrates low inspiratory effort. Heart size is mildly prominent but likely accentuated by the low lung volumes. No focal lung disease. Negative for free air. Mildly dilated loops  of small bowel in the mid abdomen measuring roughly 2.9 cm. There is gas in the left colon and evidence for gas in the stomach. The pelvis is not completely imaged. IMPRESSION: Mildly dilated loops of small bowel in the mid abdomen are nonspecific. No evidence for a high-grade bowel obstruction. Low lung volumes without focal chest disease. Electronically Signed   By: Markus Daft M.D.   On: 12/05/2017 16:49    ____________________________________________   PROCEDURES  Procedure(s) performed:   Procedures  None ____________________________________________   INITIAL IMPRESSION / ASSESSMENT AND PLAN / ED COURSE  Pertinent labs & imaging results that were available during my care of the patient were reviewed by me and considered in my medical  decision making (see chart for details).  Patient presents to the emergency department for evaluation of severe abdominal pain with nausea and vomiting.  These are similar to her episodes of gastroparesis in the past.  The patient does have severe hypertension and tachycardia which I suspect are related to acute pain.  She is also borderline febrile.  She has a prolonged QT on her EKG.  Will begin with Ativan and Reglan along with IV fluids and reassess.   Patient given Ativan and Reglan. Labs and imaging reviewed. After sleeping she woke up feeling better but did have some lingering pain. Patient given Fentanyl prior to discharge and is feeling better. Advised GI follow up.   At this time, I do not feel there is any life-threatening condition present. I have reviewed and discussed all results (EKG, imaging, lab, urine as appropriate), exam findings with patient. I have reviewed nursing notes and appropriate previous records.  I feel the patient is safe to be discharged home without further emergent workup. Discussed usual and customary return precautions. Patient and family (if present) verbalize understanding and are comfortable with this plan.  Patient  will follow-up with their primary care provider. If they do not have a primary care provider, information for follow-up has been provided to them. All questions have been answered.  ____________________________________________  FINAL CLINICAL IMPRESSION(S) / ED DIAGNOSES  Final diagnoses:  Epigastric pain     MEDICATIONS GIVEN DURING THIS VISIT:  Medications  sodium chloride 0.9 % bolus 1,000 mL (0 mLs Intravenous Stopped 12/05/17 1525)  LORazepam (ATIVAN) injection 2 mg (2 mg Intravenous Given 12/05/17 1256)  metoCLOPramide (REGLAN) injection 10 mg (10 mg Intravenous Given 12/05/17 1255)  fentaNYL (SUBLIMAZE) injection 50 mcg (50 mcg Intravenous Given 12/05/17 2154)    Note:  This document was prepared using Dragon voice recognition software and may include unintentional dictation errors.  Nanda Quinton, MD Emergency Medicine    Tawan Degroote, [facility], MD 12/06/17 [phone]

## 2017-12-05 NOTE — ED Notes (Signed)
Patient transported to X-ray 

## 2017-12-05 NOTE — ED Notes (Signed)
Pt ambulated to the restroom without difficulty, gait steady and even.  

## 2017-12-05 NOTE — Telephone Encounter (Signed)
-----   Message from Lavonia Drafts, MD sent at 12/05/2017  2:01 PM EDT ----- Regarding: RE: Surgery Please call pt. Her care is not optimized for surgery. She should f/u with her primary care provider as prev discussed.   Thx, clh-S ----- Message ----- From: Francia Greaves Sent: 12/05/2017   8:30 AM EDT To: Vanessa G [place], Carolyn Harraway-Smith, MD Subject: Surgery                                        Received a message from [place] regarding this patient.  "Patient is calling to see when her surgery date is"  I have not received a surgery request from you regarding this patient, please review and advise.

## 2017-12-05 NOTE — ED Notes (Signed)
Pt given turkey sandwich and graham crackers 

## 2017-12-05 NOTE — Telephone Encounter (Signed)
Called, patient not available. Spoke with patient's mother, left my name and phone number and asked her to have her daughter call me when she returns.

## 2017-12-05 NOTE — Discharge Instructions (Signed)

## 2017-12-05 NOTE — ED Triage Notes (Signed)
Pt arrived via [facility] from home pt reports gastroparesis flare-up.  Pt endorces LLQ-sharp 10/10 pain.  Pt is vomiting and thrashing in bed.  EMS gave 4mg  IV Zofran.  EMS reports CBG 434

## 2017-12-05 NOTE — ED Notes (Signed)
Patient verbalizes understanding of discharge instructions. Opportunity for questioning and answers were provided. 

## 2017-12-06 ENCOUNTER — Telehealth (INDEPENDENT_AMBULATORY_CARE_PROVIDER_SITE_OTHER): Payer: Self-pay | Admitting: Physician Assistant

## 2017-12-06 NOTE — Telephone Encounter (Signed)
Patient is aware that per PCP she has to get her blood sugars down before she can have surgery. Nat Christen, CMA

## 2017-12-06 NOTE — Telephone Encounter (Signed)
Patient called wanting to know if PCP got in touch with Lavonia Drafts, MD on scheduling her appointment for her hysterectimy surgery.  Please Advice [phone]  Thank you Emmit Pomfret

## 2018-01-07 ENCOUNTER — Other Ambulatory Visit (HOSPITAL_COMMUNITY)
Admission: RE | Admit: 2018-01-07 | Discharge: 2018-01-07 | Disposition: A | Discharge status: Home / Self Care | Payer: Medicaid Other | Source: Ambulatory Visit | Attending: Internal Medicine | Admitting: Internal Medicine

## 2018-01-07 ENCOUNTER — Ambulatory Visit: Payer: Self-pay | Admitting: Obstetrics & Gynecology

## 2018-01-07 ENCOUNTER — Ambulatory Visit (INDEPENDENT_AMBULATORY_CARE_PROVIDER_SITE_OTHER): Payer: Self-pay | Admitting: Internal Medicine

## 2018-01-07 VITALS — BP 165/97 | HR 91 | Wt 216.1 lb

## 2018-01-07 DIAGNOSIS — G4733 Obstructive sleep apnea (adult) (pediatric): Secondary | ICD-10-CM | POA: Diagnosis not present

## 2018-01-07 DIAGNOSIS — K219 Gastro-esophageal reflux disease without esophagitis: Secondary | ICD-10-CM | POA: Diagnosis not present

## 2018-01-07 DIAGNOSIS — Z202 Contact with and (suspected) exposure to infections with a predominantly sexual mode of transmission: Secondary | ICD-10-CM | POA: Insufficient documentation

## 2018-01-07 DIAGNOSIS — R102 Pelvic and perineal pain: Secondary | ICD-10-CM | POA: Diagnosis not present

## 2018-01-07 DIAGNOSIS — I10 Essential (primary) hypertension: Secondary | ICD-10-CM | POA: Insufficient documentation

## 2018-01-07 DIAGNOSIS — Z87891 Personal history of nicotine dependence: Secondary | ICD-10-CM | POA: Insufficient documentation

## 2018-01-07 DIAGNOSIS — N898 Other specified noninflammatory disorders of vagina: Secondary | ICD-10-CM | POA: Insufficient documentation

## 2018-01-07 DIAGNOSIS — B1910 Unspecified viral hepatitis B without hepatic coma: Secondary | ICD-10-CM | POA: Diagnosis not present

## 2018-01-07 DIAGNOSIS — E119 Type 2 diabetes mellitus without complications: Secondary | ICD-10-CM | POA: Diagnosis not present

## 2018-01-07 DIAGNOSIS — G43909 Migraine, unspecified, not intractable, without status migrainosus: Secondary | ICD-10-CM | POA: Diagnosis not present

## 2018-01-07 DIAGNOSIS — M797 Fibromyalgia: Secondary | ICD-10-CM | POA: Diagnosis not present

## 2018-01-07 DIAGNOSIS — Z7901 Long term (current) use of anticoagulants: Secondary | ICD-10-CM | POA: Diagnosis not present

## 2018-01-07 DIAGNOSIS — K5900 Constipation, unspecified: Secondary | ICD-10-CM

## 2018-01-07 DIAGNOSIS — E1340 Other specified diabetes mellitus with diabetic neuropathy, unspecified: Secondary | ICD-10-CM | POA: Insufficient documentation

## 2018-01-07 DIAGNOSIS — F329 Major depressive disorder, single episode, unspecified: Secondary | ICD-10-CM | POA: Insufficient documentation

## 2018-01-07 DIAGNOSIS — Z8742 Personal history of other diseases of the female genital tract: Secondary | ICD-10-CM

## 2018-01-07 MED ORDER — DOCUSATE SODIUM 100 MG PO CAPS: 100.0000 mg | ORAL_CAPSULE | Freq: Two times a day (BID) | ORAL | 1 refills | Status: DC

## 2018-01-07 MED ORDER — POLYETHYLENE GLYCOL 3350 17 GM/SCOOP PO POWD: 17.0000 g | Freq: Two times a day (BID) | ORAL | 2 refills | Status: DC

## 2018-01-07 NOTE — Progress Notes (Signed)
Pt had appt to see Dr Ihor Dow but was late. Here for " lump" in vagina that she thinks is bleeding. Noticed this 4 days ago. Also has not had BM in wk and having rectal pressure and some rectal bleeding when tries to have BM. Is diabetic and HTN but cannot afford any meds. Has reached her limit of meds from [facility] and wellness. Screening numbers high and offered pt to speak with Roselyn Reef with [facility] but pt declined.

## 2018-01-07 NOTE — Patient Instructions (Signed)
Your vaginal exam is normal. I do not see any lesions or masses that could be causing that lump you felt. We have collected some swabs testing for infections that can cause vaginal discharge. We have also tested for a couple STDs that can be found in bloodwork.   I have prescribed Miralax and Colace. I recommend taking each twice per day until you begin to have regular soft bowel movements, then you can decrease to once per day.   Please follow up with your primary care doctor to discuss surgical clearance for your hysterectomy and to work on getting your blood pressure and diabetes under control.

## 2018-01-07 NOTE — Progress Notes (Signed)
Subjective:    Shirley [name] - 42 y.o. female MRN [id number]  Date of birth: [birth year]  HPI  Shirley [name] is a 42 y.o. G75P2000 female here for multiple concerns. Patient with a history of AUB. Has been seen by Dr. Ihor Dow for evaluation for total hysterectomy. Per chart review, has been on Provera without adequate control of bleeding. Has had negative endometrial biopsy in June 2019. Has been told needs to have pulmonology consult for history of VTE on anti-coagulation and have surgical clearance from PCP. Would benefit from more adequate glucose and blood pressure control prior to operation.   Today, patient reports that she is not having any active uterine bleeding. She felt a lump on the inside of her vagina on the right side a few days ago and reports having some isolated bleeding with this lump. Also endorsing vaginal discharge for >1 month that is itchy and bothersome. Not currently sexually active but reports her ex-boyfriend told her he cheated on her a few weeks ago. Also complains of significant constipation.  Has not had a BM in a week and feeling a lot of rectal pressure. PMH is significant for diabetic gastroparesis. Has not had access to any of her medications for several weeks. Denies fevers, dysuria, hematuria, abdominal pain.     OB History    Gravida  2   Para  2   Term  2   Preterm      AB      Living        SAB      TAB      Ectopic      Multiple      Live Births  2             Health Maintenance:  Normal pap and negative HRHPV on June 2019.    -  reports that she quit smoking about 21 years ago. Her smoking use included cigarettes. She has never used smokeless tobacco. - Review of Systems: Per HPI. - Past Medical History: Patient Active Problem List   Diagnosis Date Noted  . History of pulmonary embolism 10/10/2017  . Chronic anticoagulation 10/10/2017  . GERD (gastroesophageal reflux disease) 10/10/2017  . Leukocytosis  10/10/2017  . Prolonged QT interval 10/10/2017  . Nausea & vomiting 09/20/2017  . Hypertensive urgency 09/20/2017  . Intractable nausea and vomiting 08/08/2017  . Chronic maxillary sinusitis 01/02/2017  . Acute blood loss anemia 11/13/2016  . Menorrhagia 11/13/2016  . Bilateral pulmonary embolism ([place]) 10/30/2016  . Acute DVT (deep venous thrombosis) ([place]) 10/30/2016  . Nausea and vomiting 09/23/2016  . Type 2 diabetes mellitus with hyperglycemia ([place]) 04/07/2016  . Diabetic gastroparesis ([place]) 04/06/2016  . Dehydration 03/27/2015  . Gastroparesis 11/11/2014  . Hematemesis 10/13/2014  . DKA (diabetic ketoacidoses) ([place]) 10/13/2014  . Abdominal pain, chronic, left lower quadrant   . Diabetic gastroparesis associated with type 2 diabetes mellitus ([place]) 04/28/2014  . History of Helicobacter pylori infection 04/22/2014  . Vaginal discharge 02/18/2014  . Unspecified constipation 07/21/2013  . Tinea corporis 07/21/2013  . Intractable vomiting 04/29/2013  . Dysmenorrhea 04/22/2013  . UTI (urinary tract infection) 07/15/2012  . Headache(784.0) 02/08/2012  . Health care maintenance 01/22/2012  . Chronic hepatitis B ([place]) 03/07/2011  . History of leukocytosis 04/06/2010  . THROMBOCYTOSIS 04/06/2010  . Polysubstance abuse ([place]) 02/23/2010  . Iron deficiency anemia 11/22/2009  . PERIPHERAL NEUROPATHY 10/01/2009  . Hyperlipidemia 08/30/2009  . Diabetic polyneuropathy ([place]) 08/30/2009  . Hidradenitis (recurrent  boils) 07/07/2008  . Depression 12/27/2007  . Abdominal pain, left lower quadrant 11/21/2007  . FIBROMYALGIA 10/30/2007  . BACK PAIN 04/01/2007  . OBSTRUCTIVE SLEEP APNEA 01/17/2007  . ANXIETY DEPRESSION 06/27/2006  . Chronic ischemic heart disease 06/15/2006  . OBESITY, MORBID 05/15/2006  . MIGRAINE HEADACHE 05/15/2006  . Asthma 05/15/2006  . Essential hypertension 01/16/2006  . IRREGULAR MENSTRUATION 01/16/2006  . PEDAL EDEMA 01/16/2006  . Poorly controlled type 2  diabetes mellitus with peripheral neuropathy ([place]) 01/16/1989   - Medications: reviewed and updated   Objective:   Physical Exam BP (!) 165/97   Pulse 91   Wt 216 lb 1.6 oz (98 kg)   BMI 34.88 kg/m  Gen: NAD, alert, cooperative with exam, well-appearing GU/GYN: Exam performed in the presence of a chaperone. External genitalia within normal limits, noted to have skin tag on the right labia majora without bleeding or evidence of irritation. No areas of edema, induration or fluctuance appreciated in the labia. Vaginal mucosa pink, moist, normal rugae. No lesions of the vaginal wall visible or palpated. Nonfriable cervix without lesions, no discharge or bleeding noted on speculum exam.  Bimanual exam revealed fibroid uterus. No cervical motion tenderness. No adnexal masses bilaterally.       Assessment & Plan:   1. Vaginal discharge No evidence of vaginal discharge on exam. Will obtain cultures and wet prep.  - Cervicovaginal ancillary only  2. Exposure to sexually transmitted disease (STD) - HIV Antibody (routine testing w rflx) - RPR - Cervicovaginal ancillary only  3. Pelvic pain No evidence of lesions or cysts present that would explain the "lump" patient palpated. Discussed that significant constipation may be causing some pelvic pressure/discomfort. Instructed to return if she experiences same symptoms. A sizeable skin tag was present on external exam but patient reports this was not in the same area that she felt the lump.  - Cervicovaginal ancillary only  4. Constipation, unspecified constipation type Likely needs follow up with GI for her diabetic gastroparesis. Discussed lifestyle modifications for constipation and that uncontrolled DM will worsen her constipation.  - polyethylene glycol powder (GLYCOLAX/MIRALAX) powder; Take 17 g by mouth 2 (two) times daily.  Dispense: 500 g; Refill: 2 - docusate sodium (COLACE) 100 MG capsule; Take 1 capsule (100 mg total) by mouth 2 (two)  times daily.  Dispense: 60 capsule; Refill: 1  5. History of abnormal uterine bleeding No active bleeding at present so does not warrant treatment with progesterone. Has recent endometrial biopsy and ultrasound on file. Patient to re-schedule visit with Dr. Ihor Dow. Instructed to see PCP tomorrow at already scheduled visit to discuss surgical clearance and adequate control of chronic medical conditions. BP noted to be elevated, patient reports she has been without her medications and will discuss with PCP tomorrow.     Phill Myron, D.O. OB Fellow  01/07/2018, 5:14 PM

## 2018-01-08 ENCOUNTER — Inpatient Hospital Stay (INDEPENDENT_AMBULATORY_CARE_PROVIDER_SITE_OTHER): Payer: Self-pay | Admitting: Physician Assistant

## 2018-01-08 LAB — RPR: RPR Ser Ql: NONREACTIVE

## 2018-01-08 LAB — HIV ANTIBODY (ROUTINE TESTING W REFLEX): HIV SCREEN 4TH GENERATION: NONREACTIVE

## 2018-01-09 LAB — CERVICOVAGINAL ANCILLARY ONLY
Bacterial vaginitis: POSITIVE — AB
Candida vaginitis: NEGATIVE
Chlamydia: NEGATIVE
NEISSERIA GONORRHEA: NEGATIVE
TRICH (WINDOWPATH): NEGATIVE

## 2018-01-10 ENCOUNTER — Other Ambulatory Visit: Payer: Self-pay | Admitting: Internal Medicine

## 2018-01-10 DIAGNOSIS — N760 Acute vaginitis: Principal | ICD-10-CM

## 2018-01-10 DIAGNOSIS — B9689 Other specified bacterial agents as the cause of diseases classified elsewhere: Secondary | ICD-10-CM

## 2018-01-10 MED ORDER — METRONIDAZOLE 500 MG PO TABS: 500.0000 mg | ORAL_TABLET | Freq: Two times a day (BID) | ORAL | 0 refills | Status: DC

## 2018-01-10 MED FILL — metroNIDAZOLE 500 MG TABS: 500 | 7 days supply | Qty: 14 | Fill #0

## 2018-01-10 NOTE — Progress Notes (Signed)
Please call patient to let her know test results showed bacterial vaginosis. I have prescribed a 7 day course of Flagyl.   Phill Myron, D.O. [facility] Fellow, [facility] for [facility] [facility] 01/10/2018, 2:52 PM

## 2018-01-11 ENCOUNTER — Telehealth: Payer: Self-pay | Admitting: *Deleted

## 2018-01-11 NOTE — Telephone Encounter (Signed)
Attempted to call pt to inform her that her test results showed bacterial vaginosis and that Dr. Juleen China called in a 7 day prescription of Flagyl to the [facility].  Pt did not pick up so voicemail left asking her to please call the office back to discuss results.  Letter sent to pt informing her of same.

## 2018-01-17 ENCOUNTER — Inpatient Hospital Stay (INDEPENDENT_AMBULATORY_CARE_PROVIDER_SITE_OTHER): Payer: Self-pay | Admitting: Physician Assistant

## 2018-02-08 ENCOUNTER — Encounter: Payer: Self-pay | Admitting: General Practice

## 2018-02-08 ENCOUNTER — Ambulatory Visit: Payer: Self-pay | Admitting: Obstetrics & Gynecology

## 2018-02-08 NOTE — Progress Notes (Unsigned)
Patient no showed for appt today, per Dr Ihor Dow patient can reschedule on her own.

## 2018-02-26 IMAGING — US US PELVIS COMPLETE
1 series · 14 of 25 positions shown · non-contrast
Comparison: Abdominal CT dated 11/05/2016

CLINICAL DATA: 41-year-old female with pelvic pain.

EXAM:
TRANSABDOMINAL ULTRASOUND OF PELVIS
TECHNIQUE: Transabdominal ultrasound examination of the pelvis was performed
including evaluation of the uterus, ovaries, adnexal regions, and
pelvic cul-de-sac.

[Series 1: us pelvis complete · 0.21mm/px · 14 of 43 slices shown]
[im 1/43]
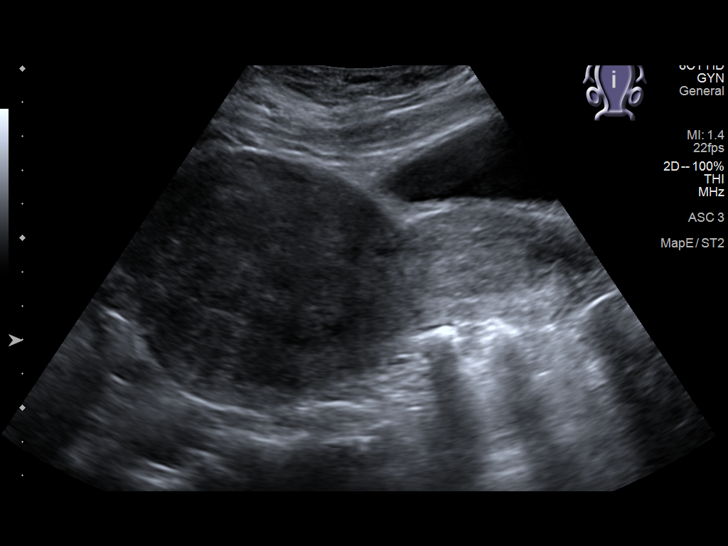
[im 4/43]
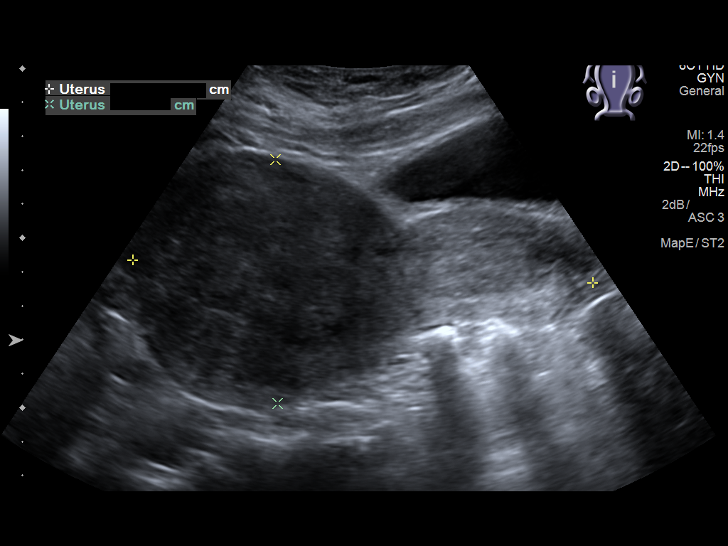
[im 8/43]
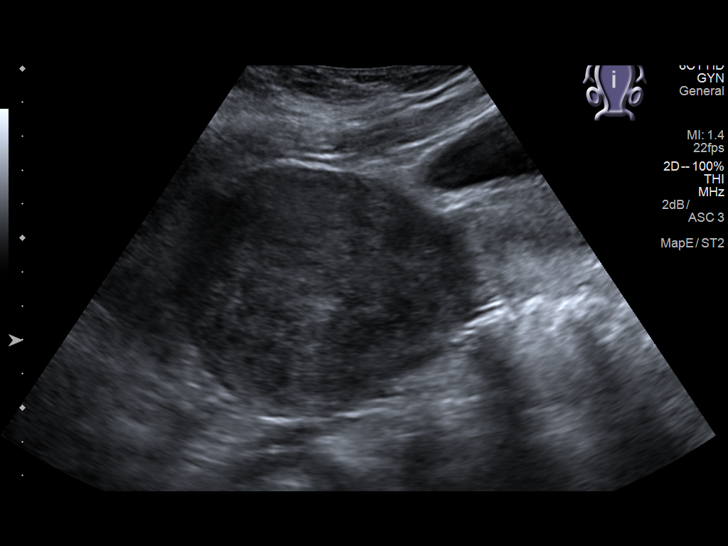
[im 11/43]
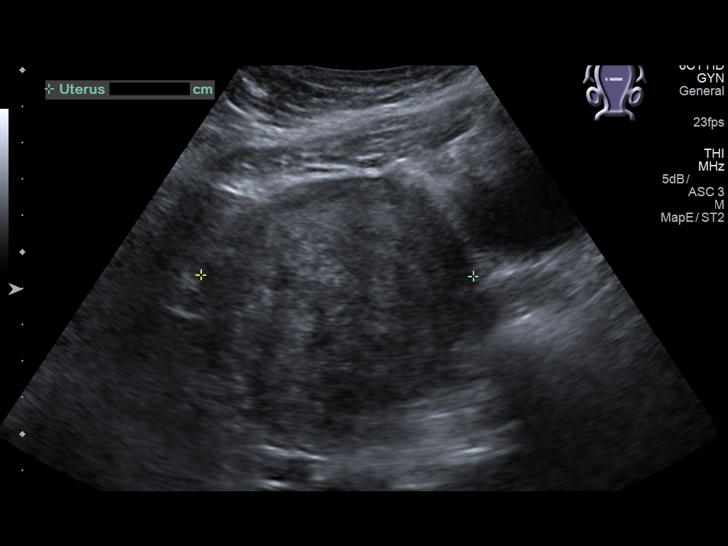
[im 15/43]
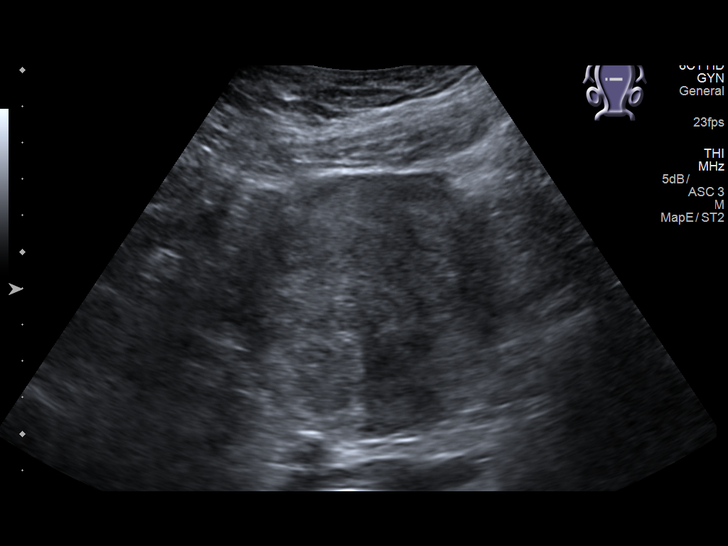
[im 16/43]
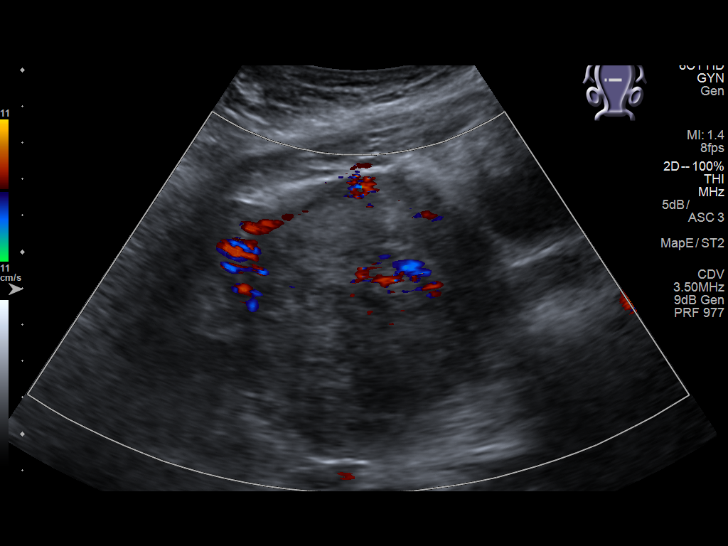
[im 20/43]
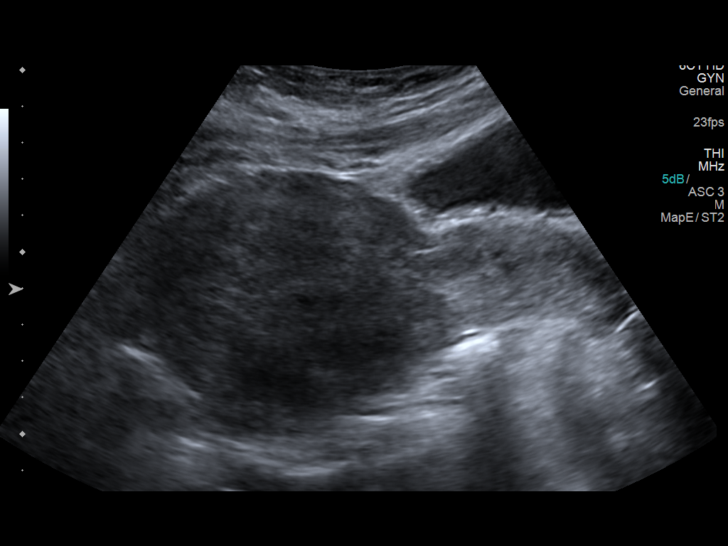
[im 23/43]
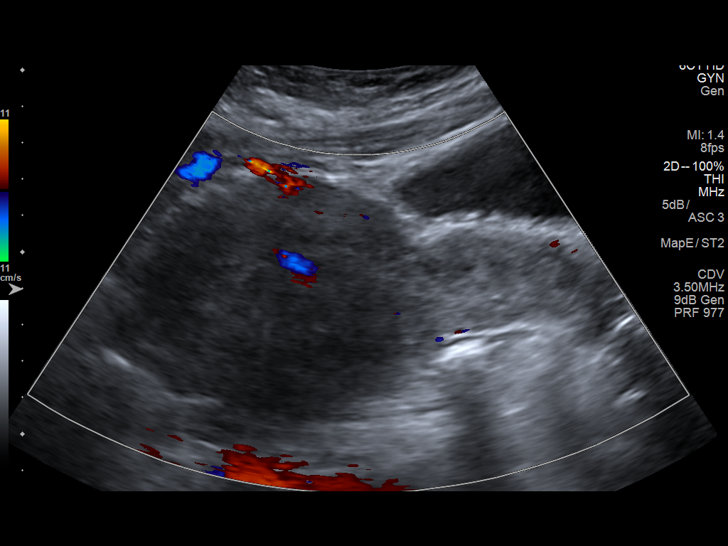
[im 27/43]
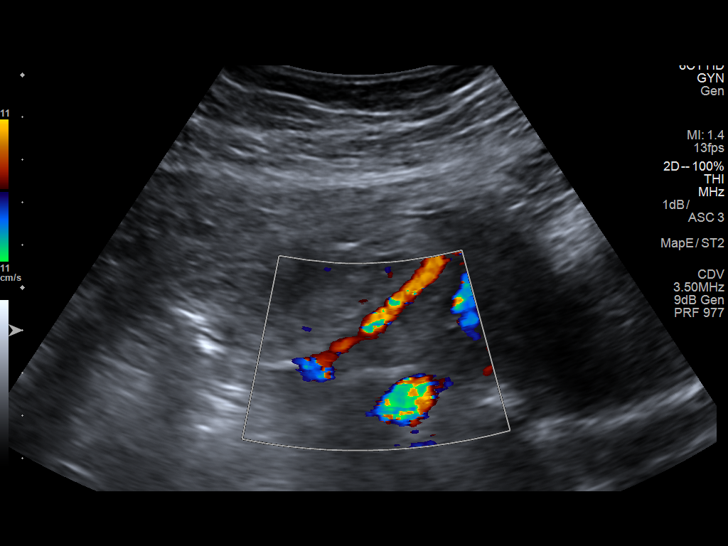
[im 29/43]
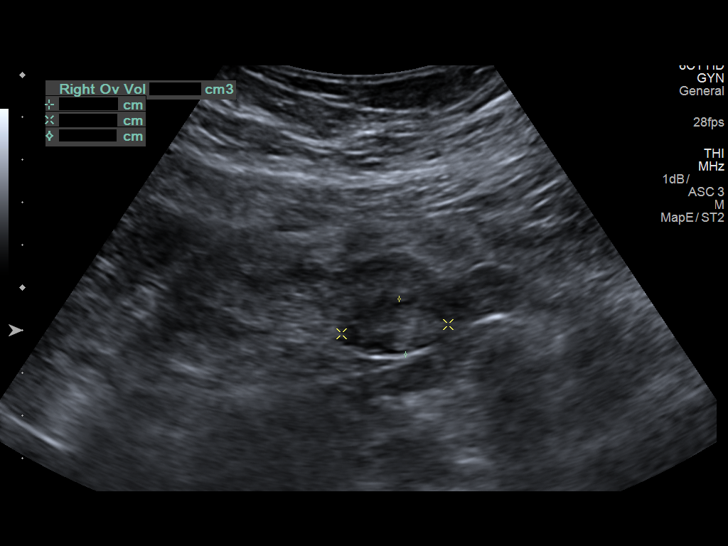
[im 32/43]
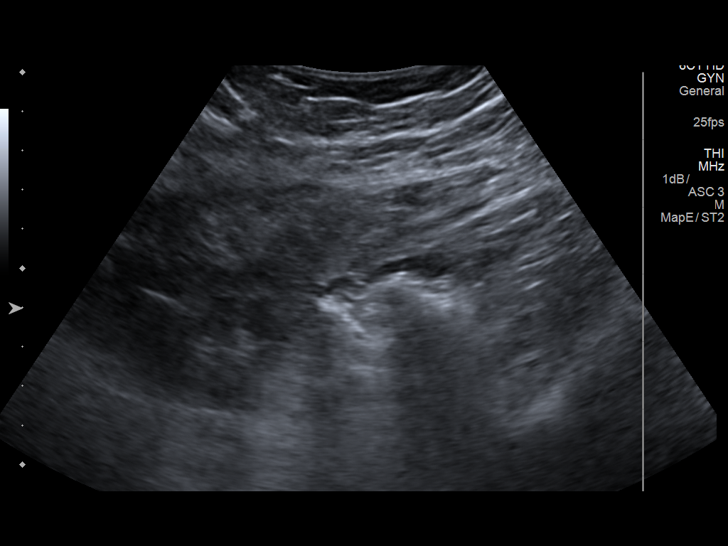
[im 36/43]
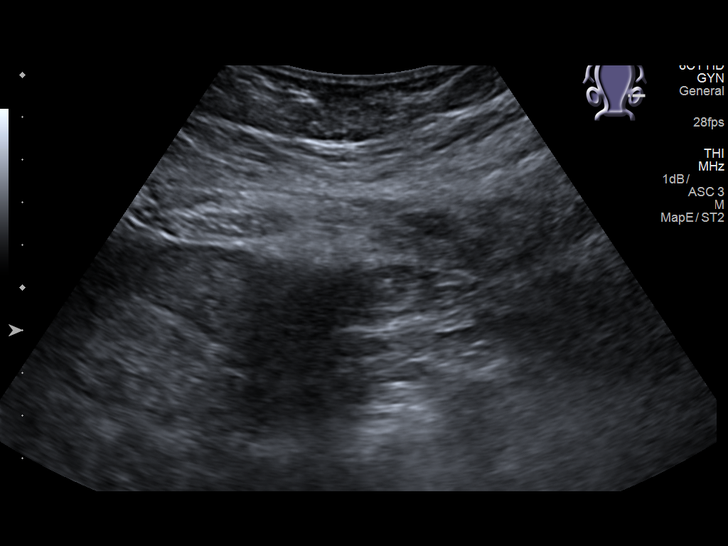
[im 39/43]
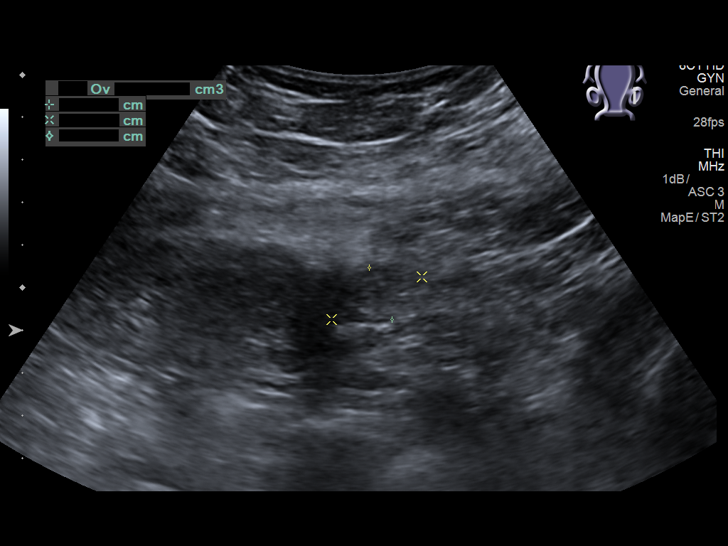
[im 43/43]
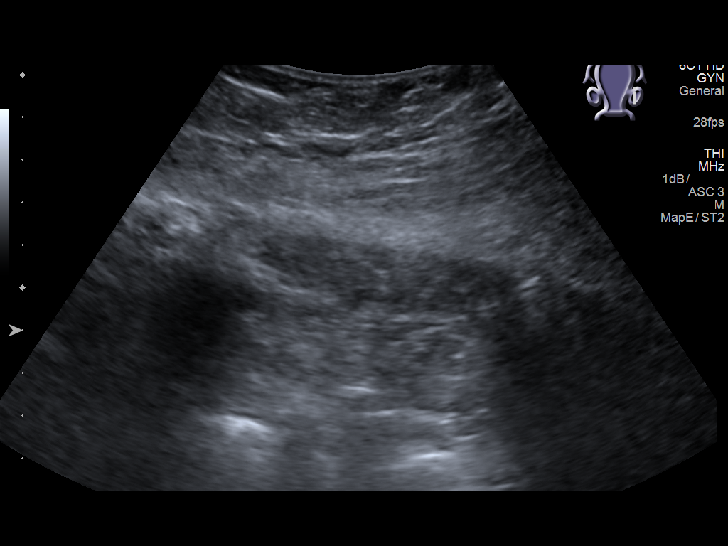

[14 of 25 positions shown; findings below may reference images not displayed]

FINDINGS: Uterus

Measurements: The uterus is anteverted and measures 13.6 x 7.2 x
cm.. There is a 6.5 x 5.7 x 6.4 cm posterior body ill-defined
intramural fibroid.

Endometrium

Thickness: 3 mm. The endometrium is poorly visualized and not seen
in its entirety. There is apparent anterior displacement of the
endometrium by the fibroid.

Right ovary

Measurements: 2.5 x 1.3 x 2.3 cm. Normal appearance/no adnexal mass.

Left ovary

Measurements: 2.3 x 1.3 x 2.5 cm. Normal appearance/no adnexal mass.

Other findings:  No abnormal free fluid.
IMPRESSION: 1. Large posterior body intramural fibroid with mass effect and
anterior displacement of the endometrium.
2. Poorly visualized endometrium with incomplete evaluation.
3. Unremarkable ovaries.

## 2018-03-19 IMAGING — US US ABDOMEN LIMITED
1 series · 14 of 25 positions shown · non-contrast
Comparison: CT abdomen and pelvis November 05, 2016

CLINICAL DATA: Right upper quadrant pain

EXAM:
ULTRASOUND ABDOMEN LIMITED RIGHT UPPER QUADRANT

[Series 1: us abdomen limited · 0.20mm/px · 14 of 38 slices shown]
[im 1/38]
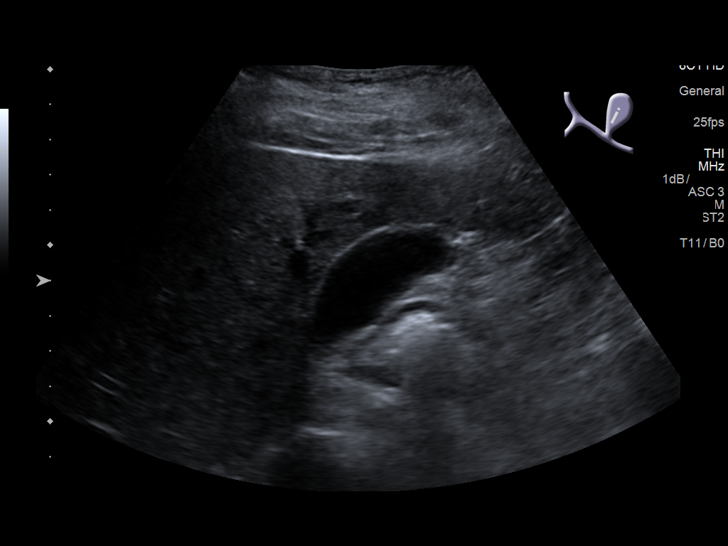
[im 4/38]
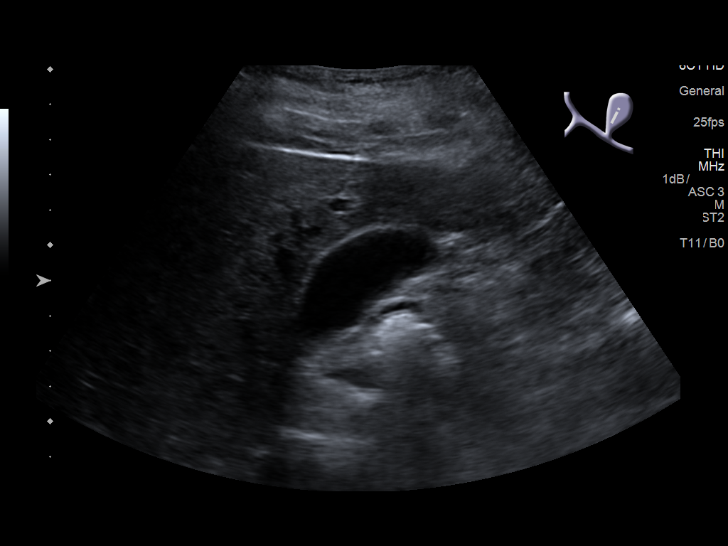
[im 7/38]
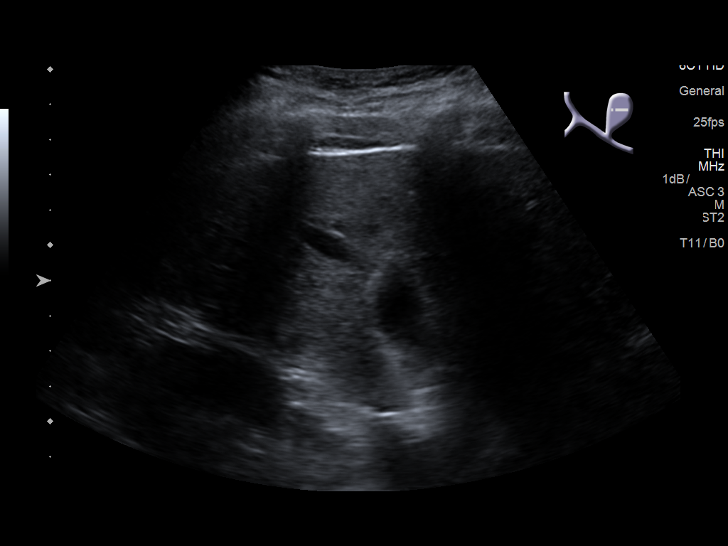
[im 10/38]
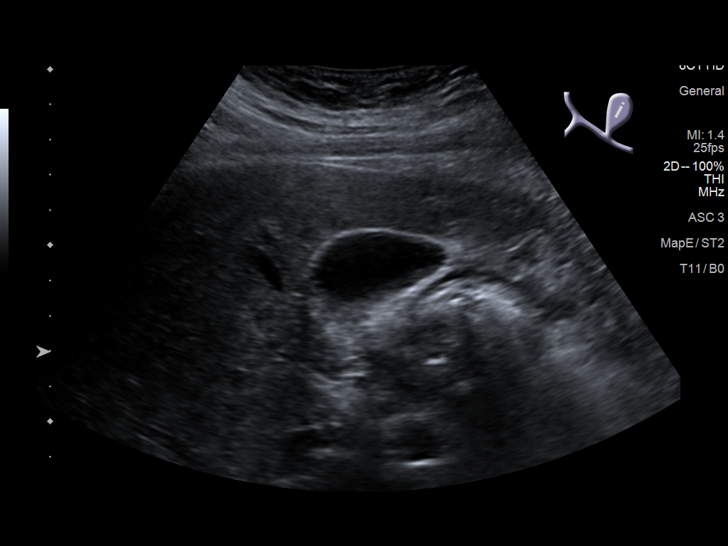
[im 13/38]
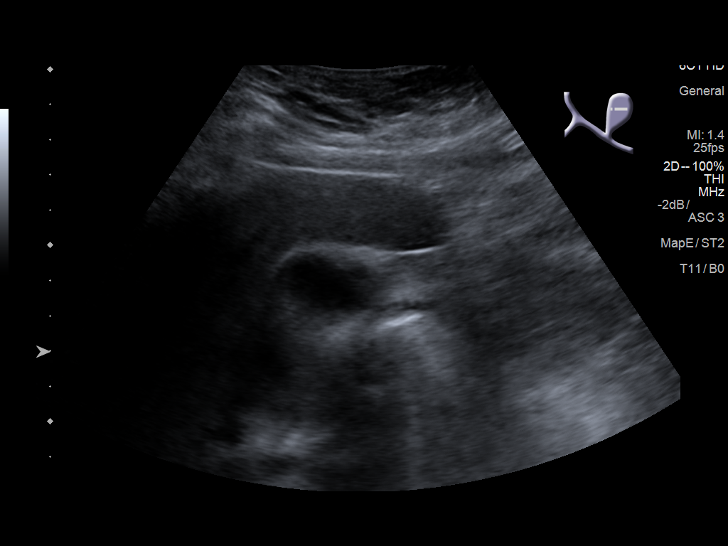
[im 14/38]
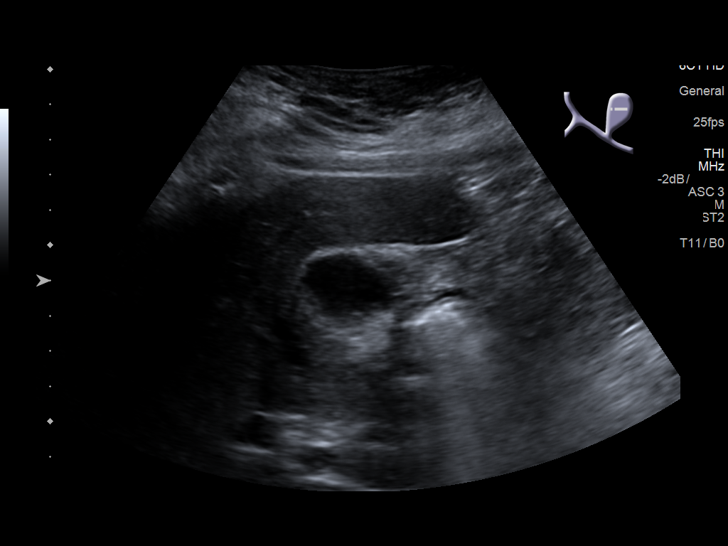
[im 17/38]
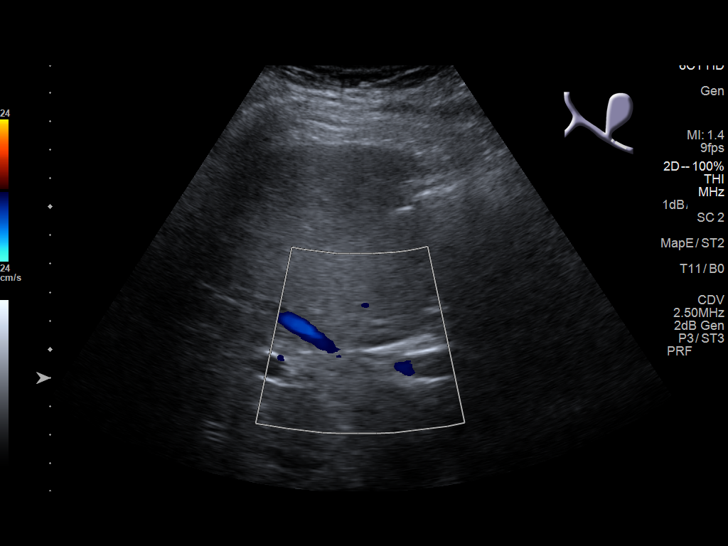
[im 21/38]
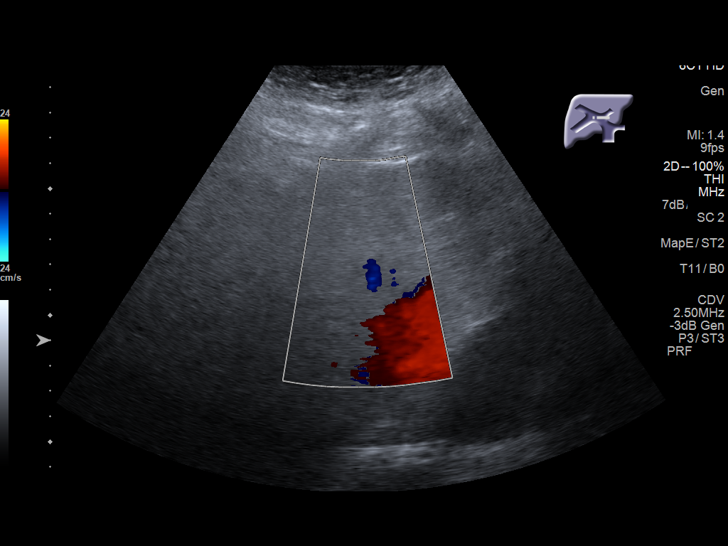
[im 24/38]
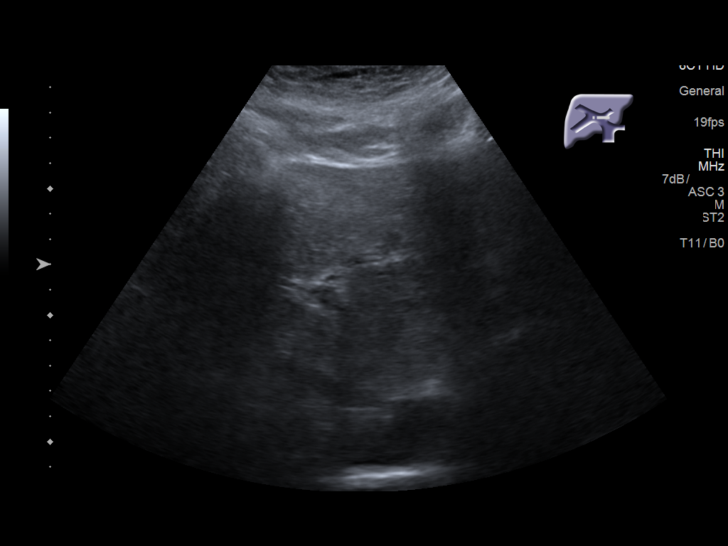
[im 25/38]
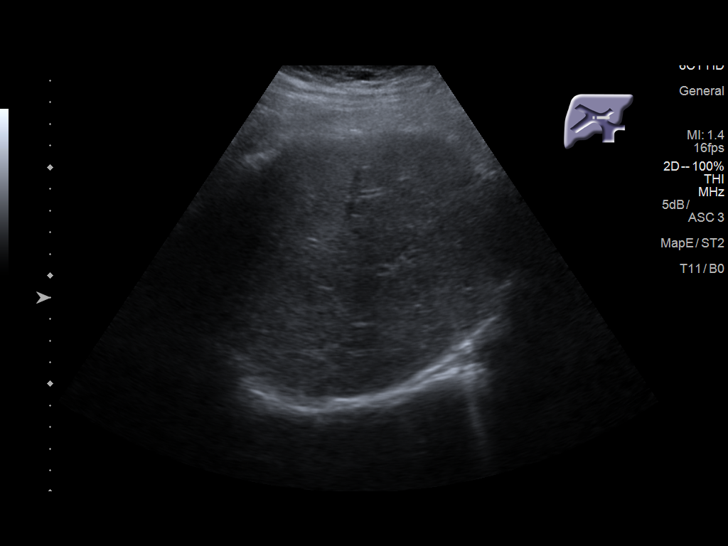
[im 28/38]
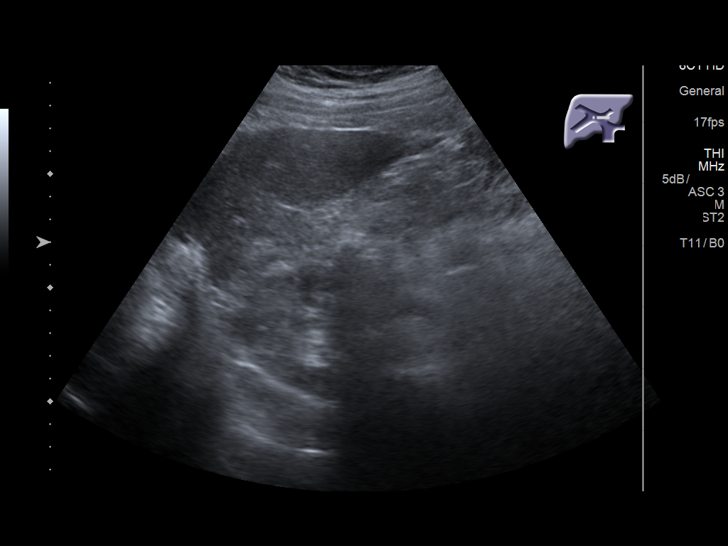
[im 31/38]
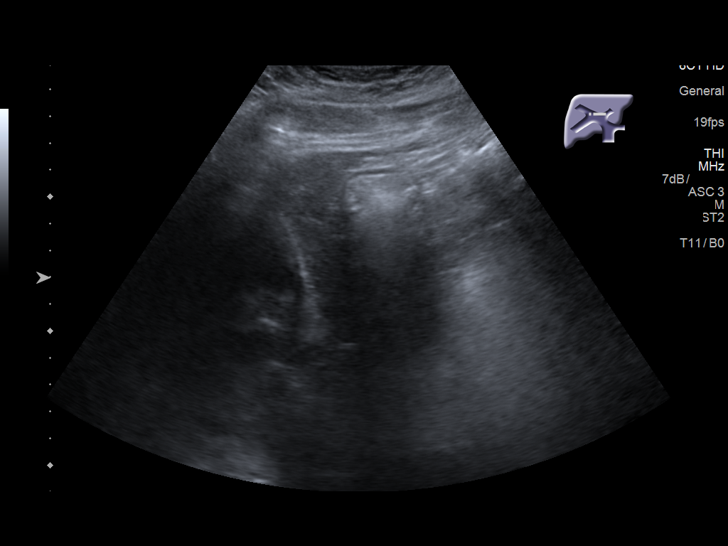
[im 34/38]
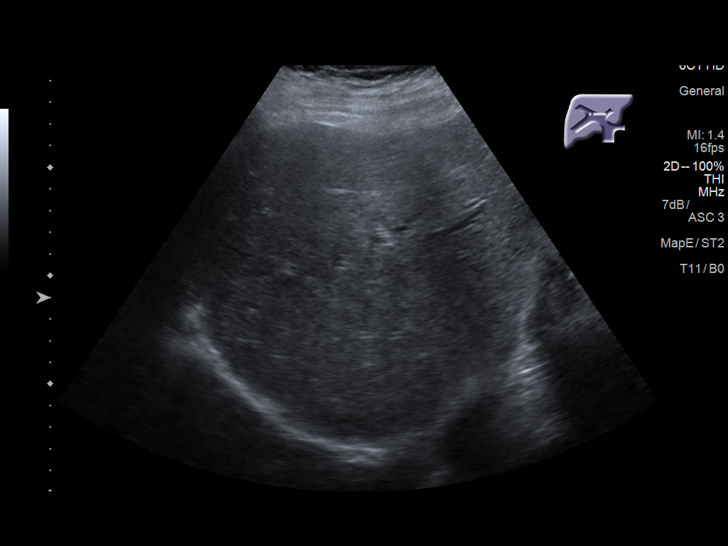
[im 38/38]
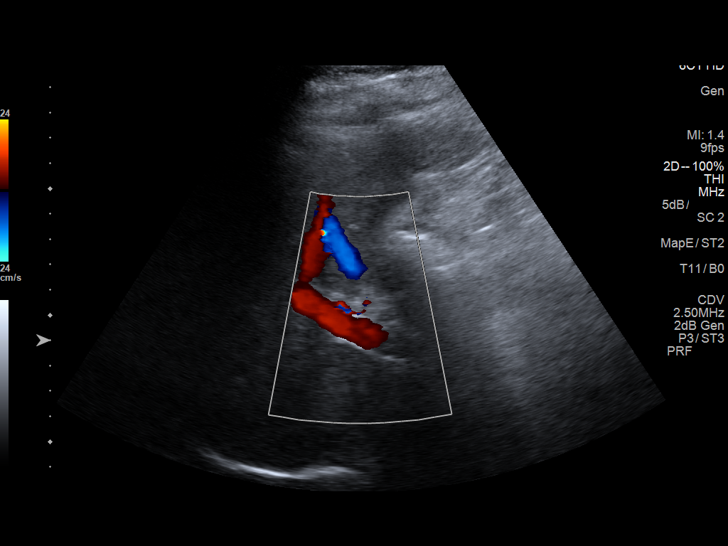

[14 of 25 positions shown; findings below may reference images not displayed]

FINDINGS: Gallbladder:

No gallstones or wall thickening visualized. There is no
pericholecystic fluid. No sonographic Murphy sign noted by
sonographer.

Common bile duct:

Diameter: 4 mm. No intrahepatic or extrahepatic biliary duct
dilatation.

Liver:

No focal lesion identified. Within normal limits in parenchymal
echogenicity. Flow in the portal vein is in the anatomic direction.
IMPRESSION: Study within normal limits.

## 2018-03-23 IMAGING — US US TRANSVAGINAL NON-OB
1 series · 13 of 25 positions shown · non-contrast
Comparison: None.

CLINICAL DATA: Vaginal bleeding since 10/26/2016 with right adnexal
tenderness 2 months.

EXAM:
TRANSABDOMINAL AND TRANSVAGINAL ULTRASOUND OF PELVIS
DOPPLER ULTRASOUND OF OVARIES
TECHNIQUE: Both transabdominal and transvaginal ultrasound examinations of the
pelvis were performed. Transabdominal technique was performed for
global imaging of the pelvis including uterus, ovaries, adnexal
regions, and pelvic cul-de-sac.
It was necessary to proceed with endovaginal exam following the
transabdominal exam to visualize the endometrium and ovaries. Color
and duplex Doppler ultrasound was utilized to evaluate blood flow to
the ovaries.

[Series 1: us transvaginal non-ob · 0.24mm/px · 77 acquisitions, 13 frames shown]
[im 1/77]
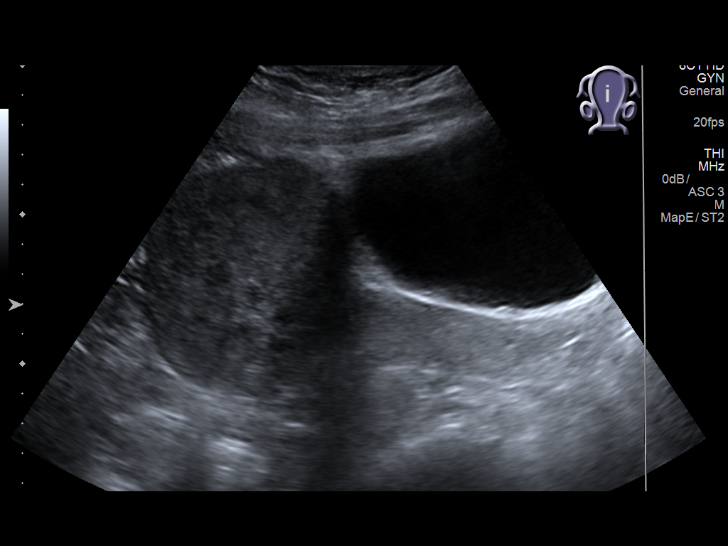
[im 7/77]
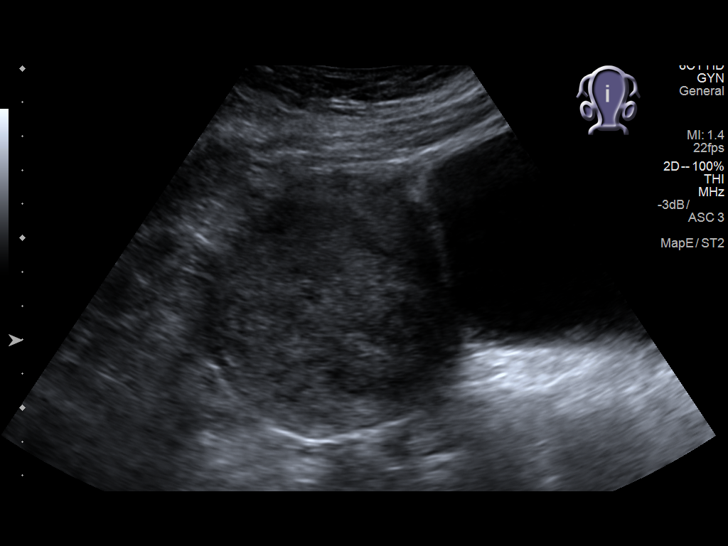
[im 13/77]
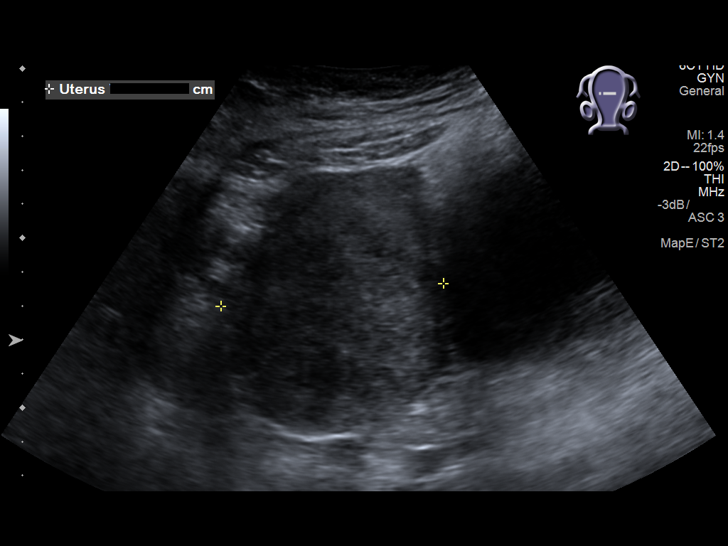
[im 20/77]
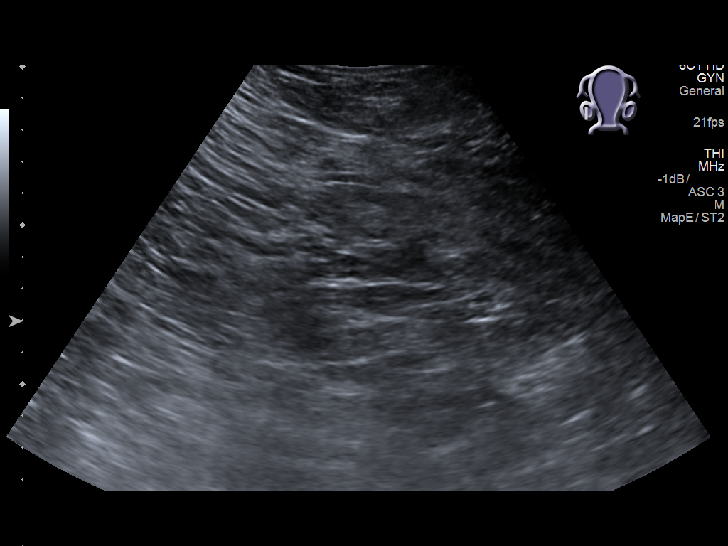
[im 26/77]
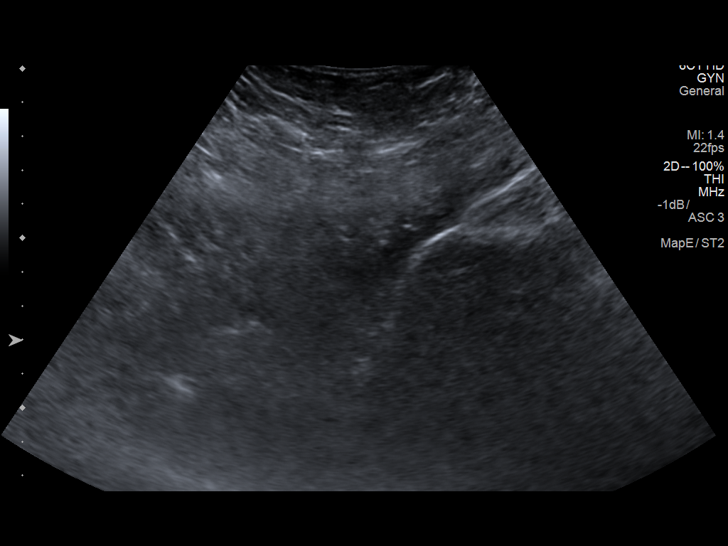
[im 32/77]
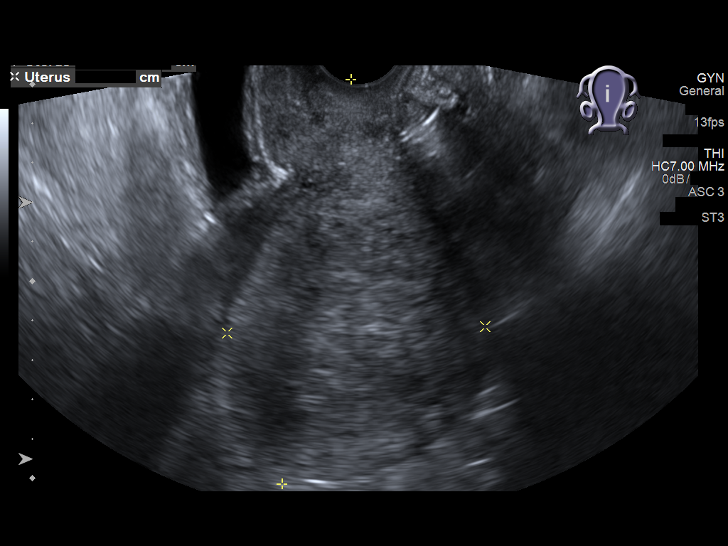
[im 39/77]
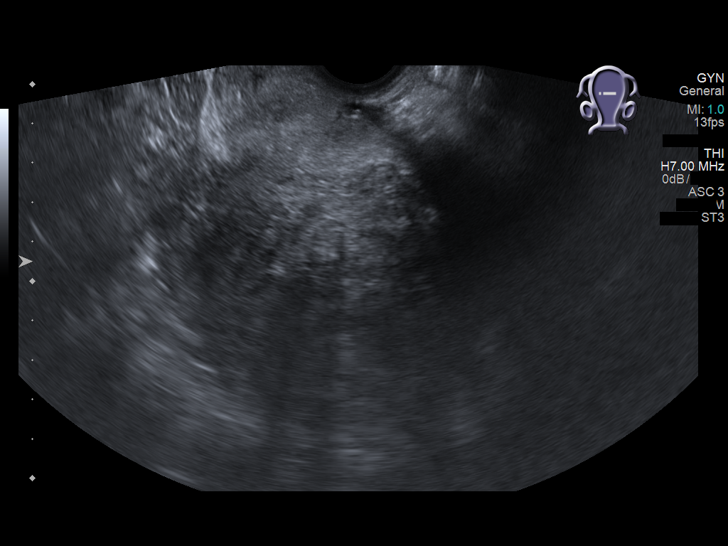
[im 45/77]
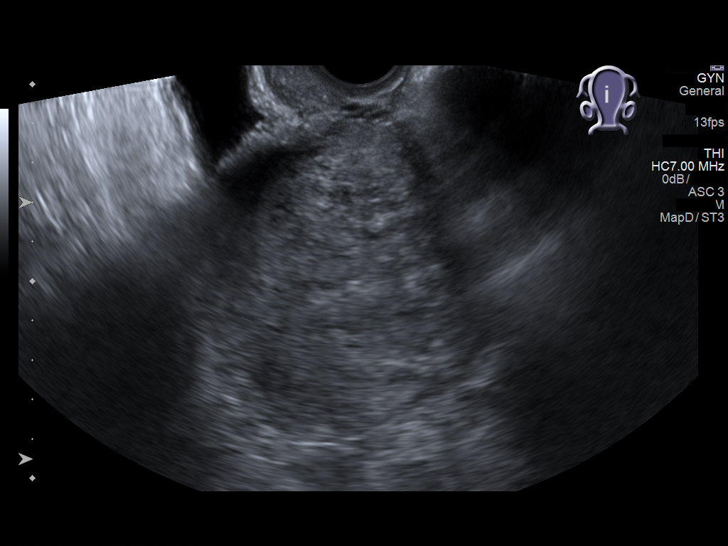
[im 51/77]
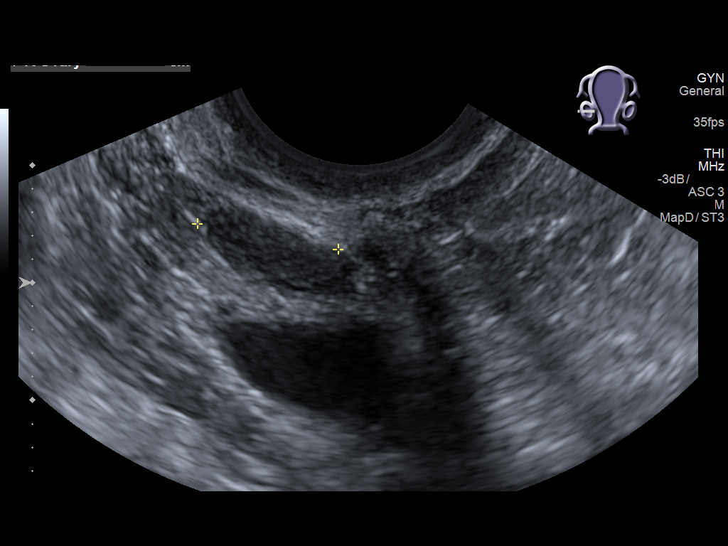
[im 58/77]
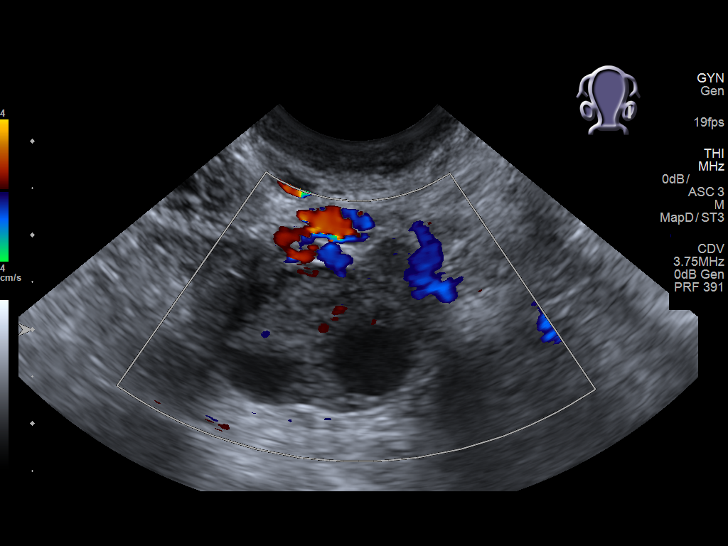
[im 64/77]
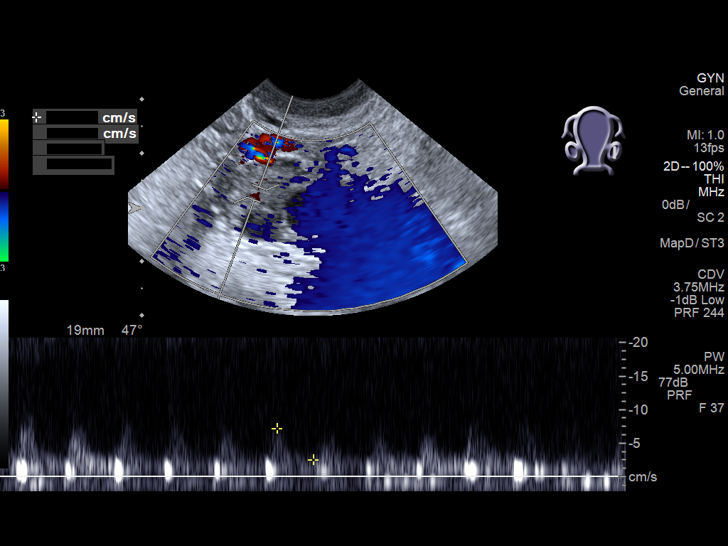
[im 70/77]
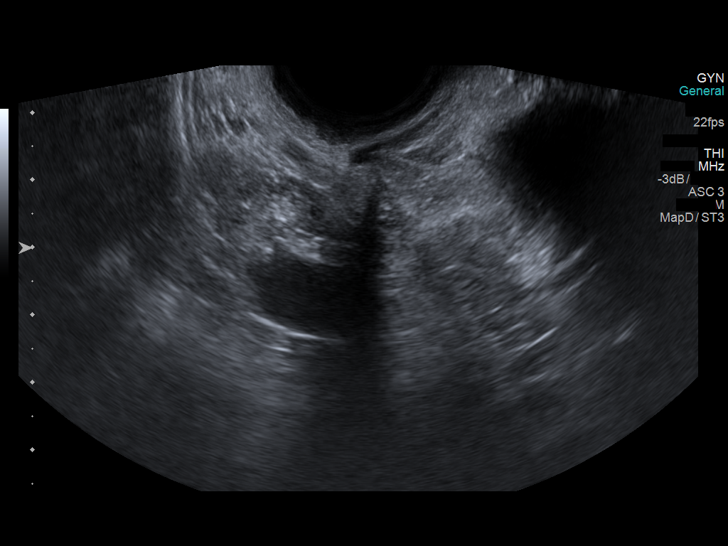
[im 77/77]
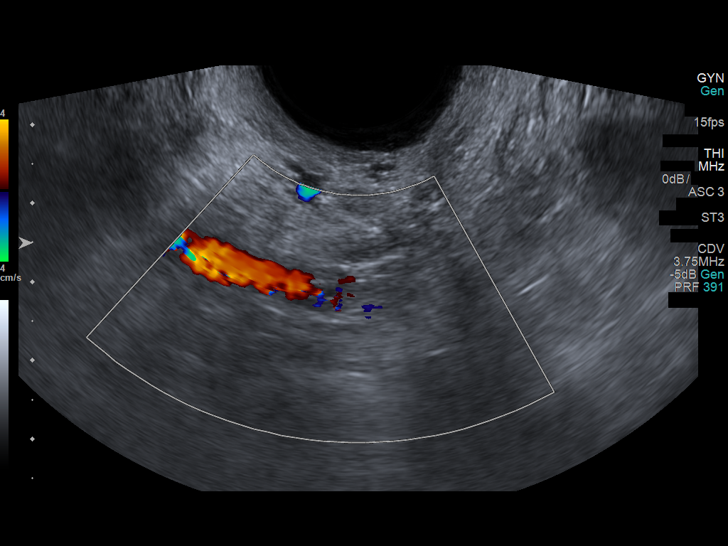

[13 of 25 positions shown; findings below may reference images not displayed]

FINDINGS: Exam difficult due to patient body habitus.

Uterus

Measurements: 8.3 x 6.6 x 16.3 cm. Known large fundal fibroid
measuring 6.6 x 6.9 x 7.0 cm.

Endometrium

Not accurately visualized and may be partially obscured by the large
fundal fibroid.

Right ovary

Difficult to visualize. Measurements: 0.6 x 1.2 x 1.8 cm. Normal
appearance/no adnexal mass.

Left ovary

Measurements: 1.7 x 2.4 x 2.6 cm. Normal appearance/no adnexal mass.

Pulsed Doppler evaluation of both ovaries demonstrates normal
low-resistance arterial and venous waveforms.

Other findings

No abnormal free fluid.
IMPRESSION: Known moderate size fundal fibroid measuring 6.6 x 6.9 x 7.0 cm
making visualization of the endometrium difficult. This may be the
cause of patient's vaginal bleeding.

Ovaries are normal without evidence of torsion.

## 2018-06-08 IMAGING — CR DG KNEE COMPLETE 4+V*R*
4 series · 4 of 4 positions shown · non-contrast
Comparison: April 17, 2015

CLINICAL DATA: Progressive knee pain.  Injury 7 months prior

EXAM:
RIGHT KNEE - COMPLETE 4+ VIEW

[t knee ap right]
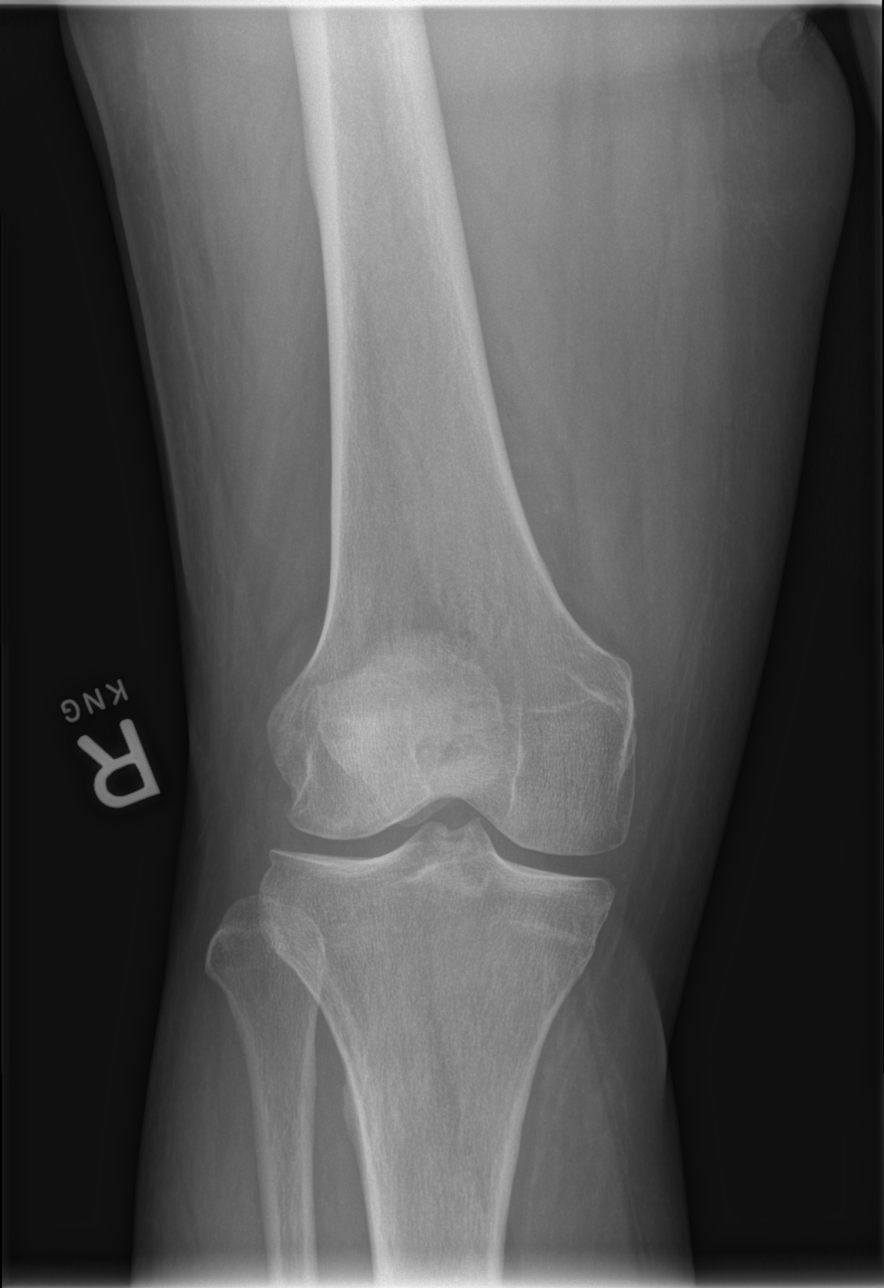

[t knee obl right (1 of 2)]
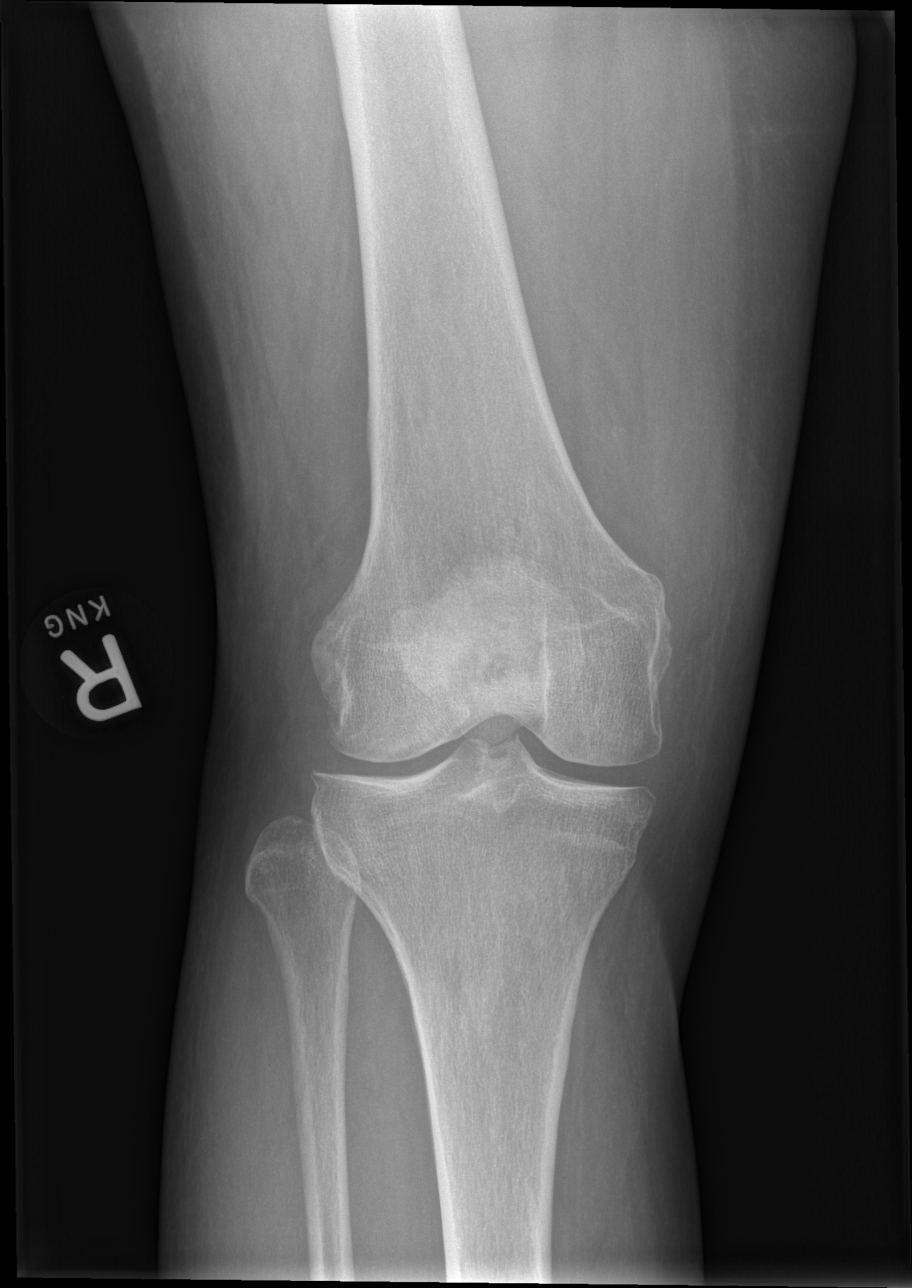

[t knee obl right (2 of 2)]
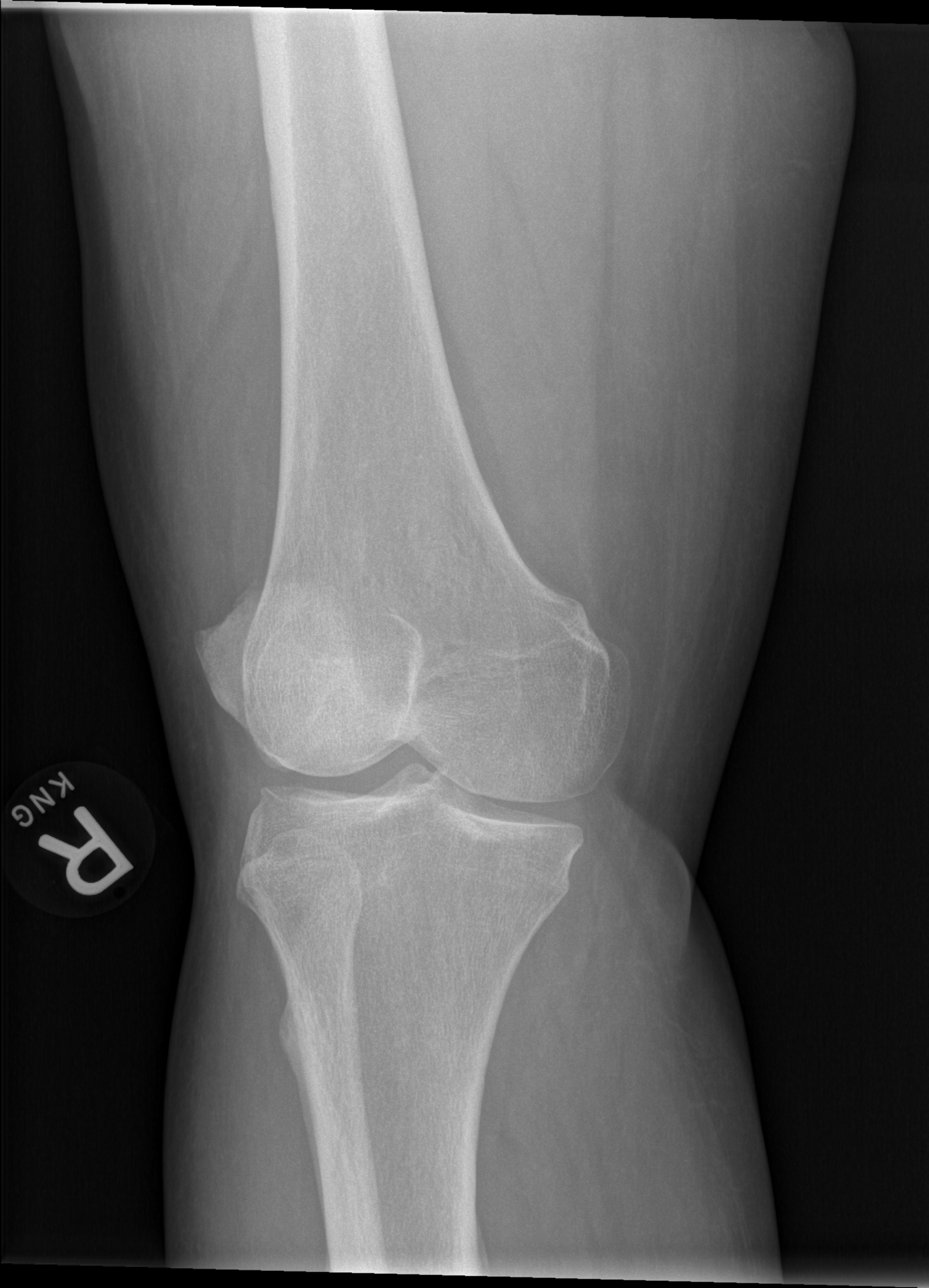

[t knee lat right]
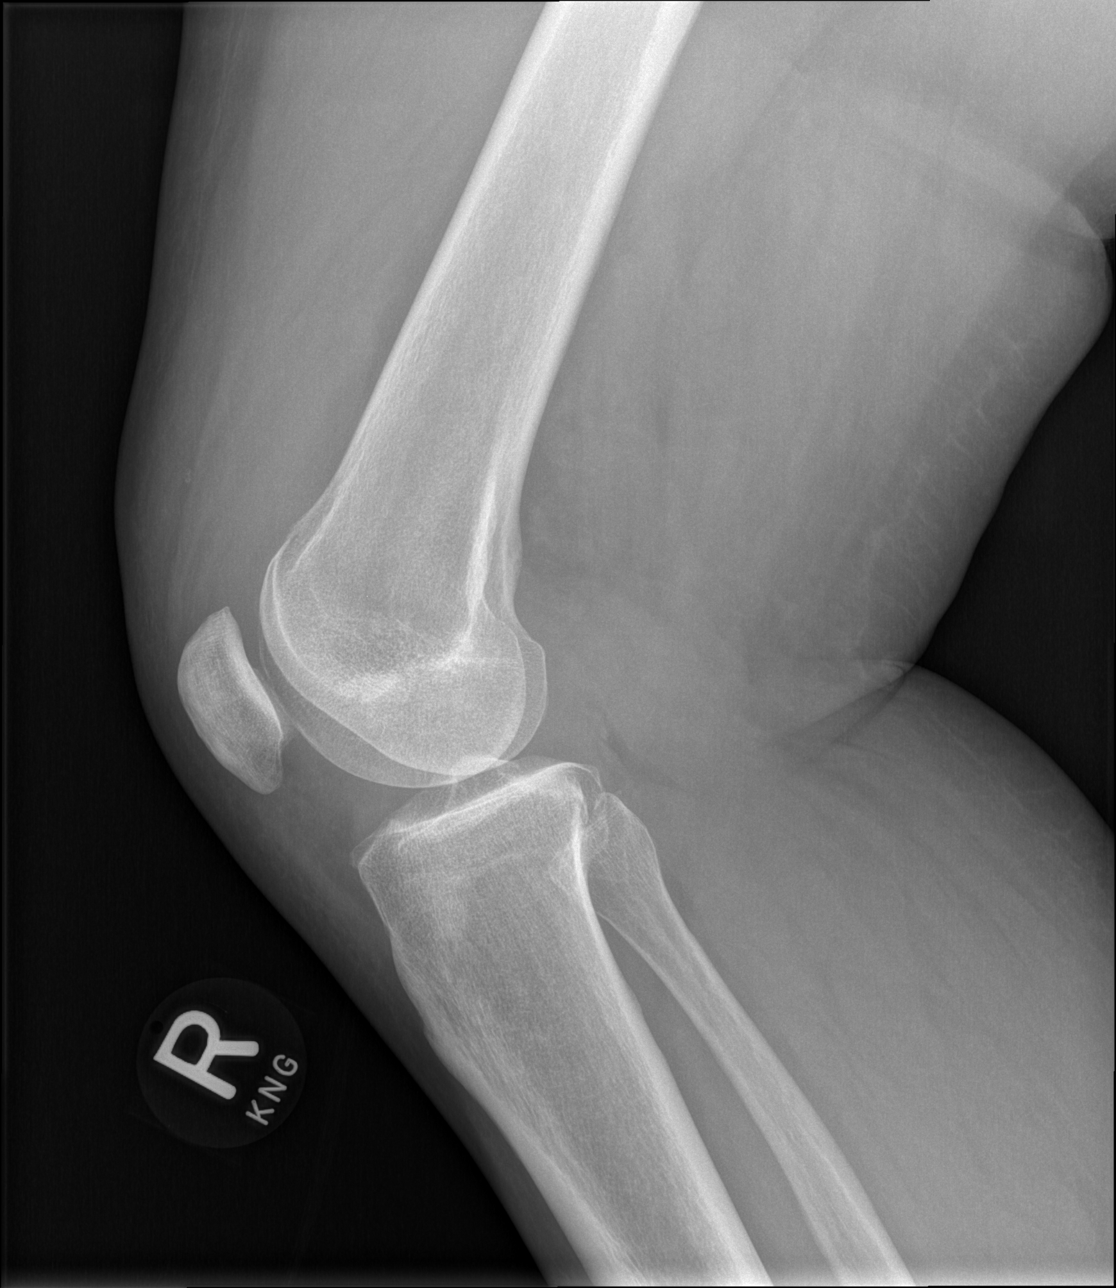

[4 of 4 positions shown; findings below may reference images not displayed]

FINDINGS: Frontal, lateral, and bilateral oblique views were obtained. There
is no demonstrable fracture or dislocation. Joint effusion is
significantly smaller than on prior study. Small residual joint
effusion remains. Joint spaces appear normal. No erosive change.
IMPRESSION: Small joint effusion, considerably smaller compared to prior study.
No fracture or dislocation. No apparent arthropathy.

## 2018-07-02 IMAGING — DX DG CHEST 2V
2 series · 2 of 2 positions shown · non-contrast
Comparison: 10/13/2014

CLINICAL DATA: Chest pain and vomiting for 3 days.

EXAM:
CHEST  2 VIEW

[chest pa]
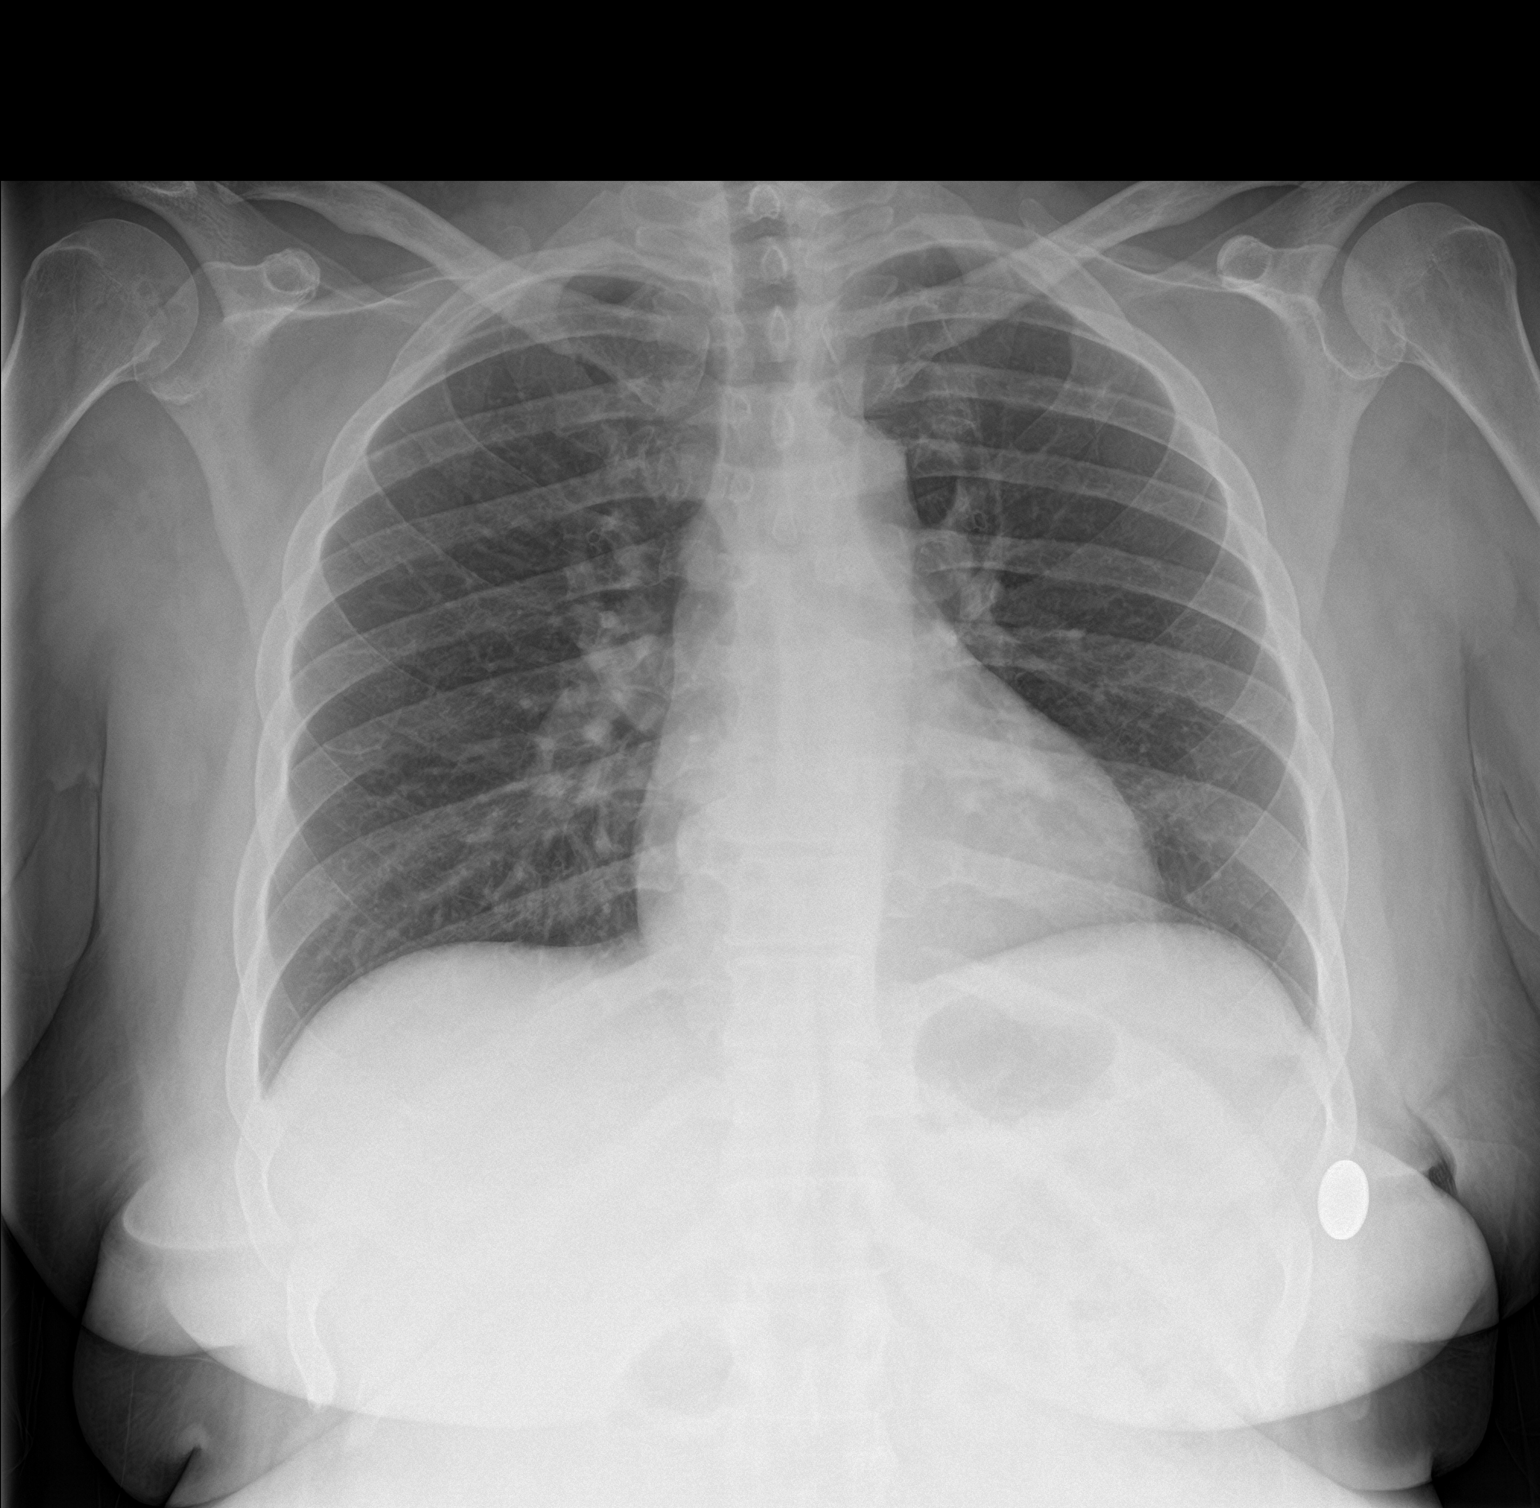

[chest lat]
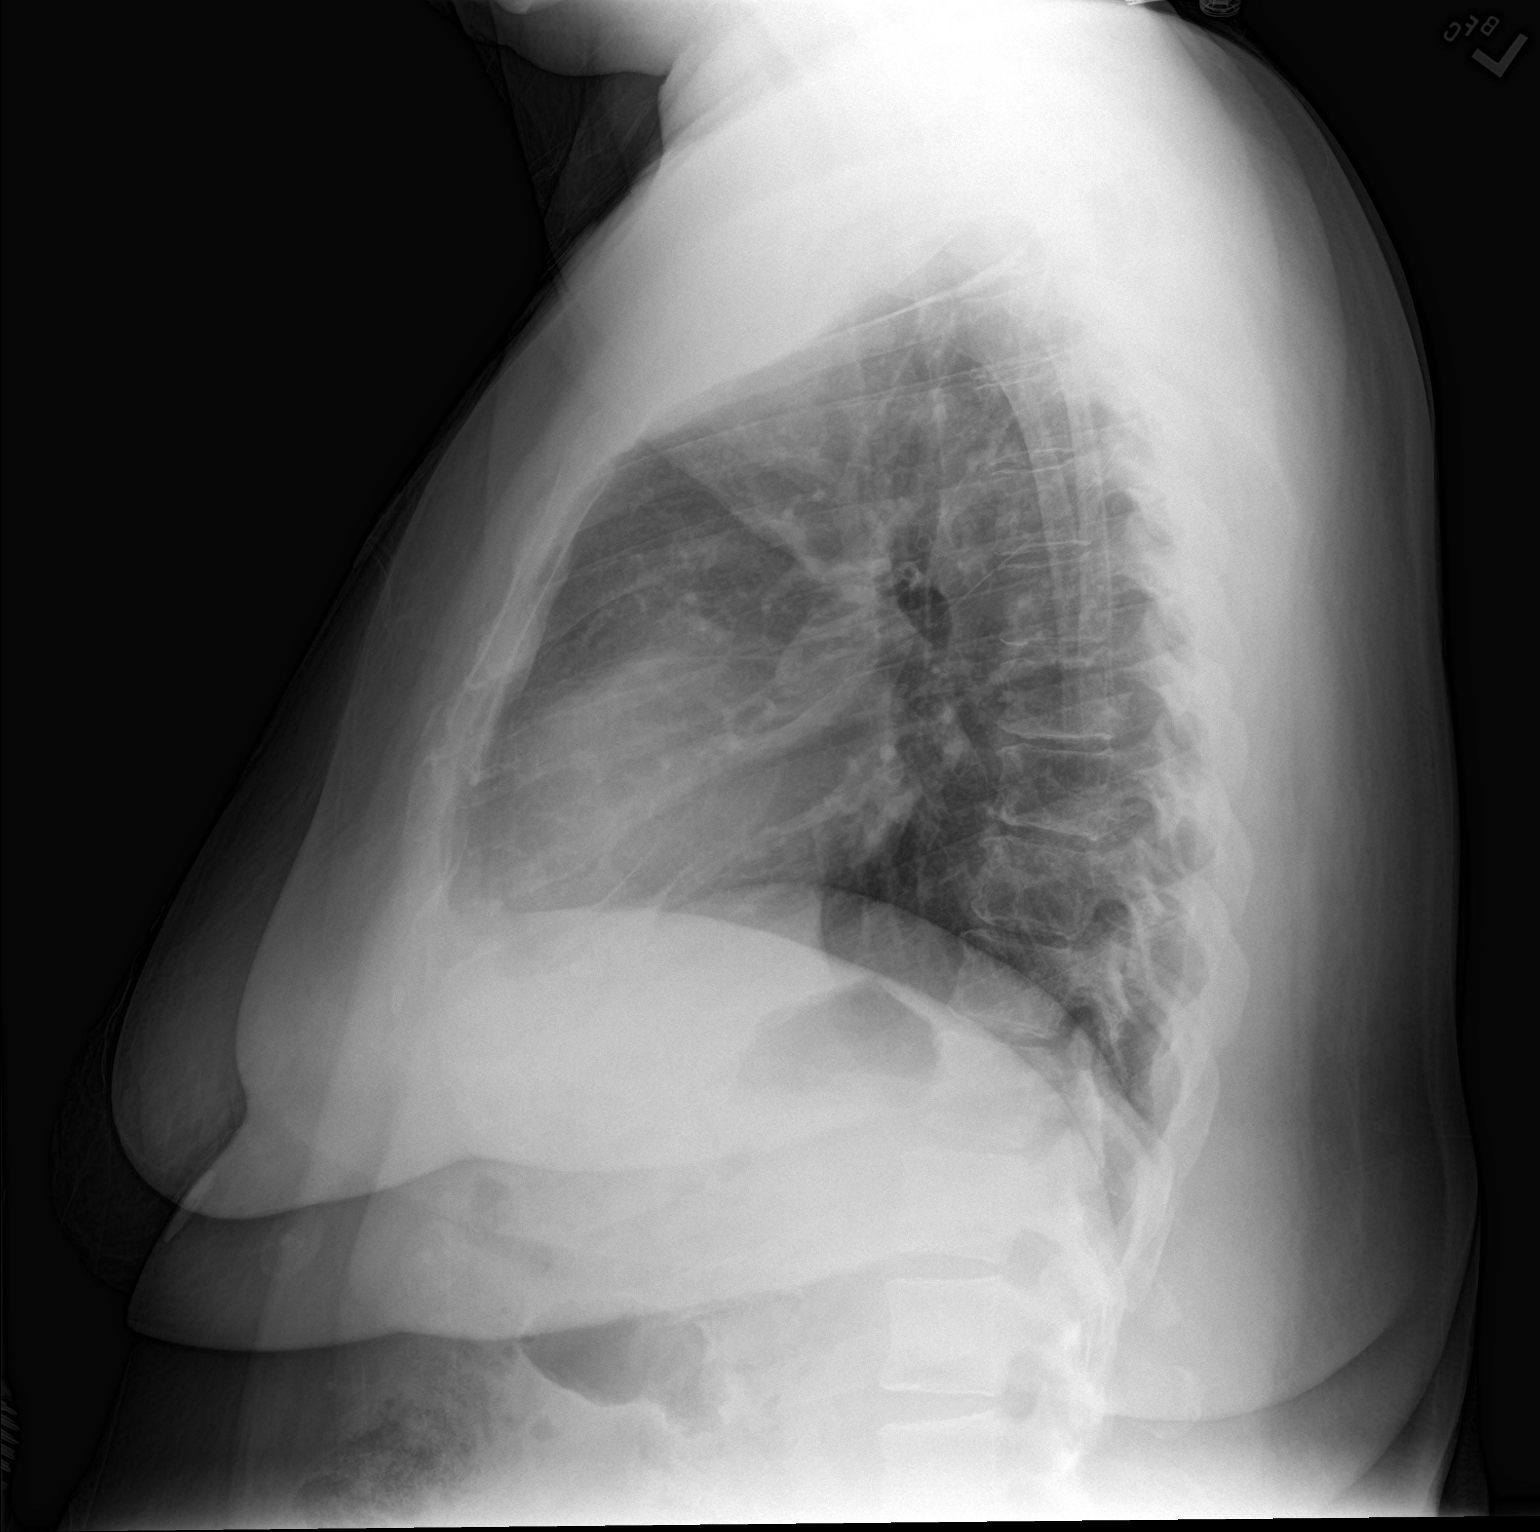

[2 of 2 positions shown; findings below may reference images not displayed]

FINDINGS: The cardiomediastinal contours are normal. The lungs are clear.
Pulmonary vasculature is normal. No consolidation, pleural effusion,
or pneumothorax. No acute osseous abnormalities are seen.
IMPRESSION: No acute pulmonary process.

## 2018-07-02 IMAGING — CT CT ABD-PELV W/ CM
2 of 5 series · 16 of 46 positions shown, 18 images · IV contrast (APPLIED)
Comparison: CT dated 03/30/2015

CLINICAL DATA: 40-year-old female with history of thrombocytosis
presenting with abdominal pain and guarding. History of
pyelonephritis hepatitis B and gastritis.

EXAM:
CT ABDOMEN AND PELVIS WITH CONTRAST
TECHNIQUE: Multidetector CT imaging of the abdomen and pelvis was performed
using the standard protocol following bolus administration of
intravenous contrast.
CONTRAST:  100mL P90N7K-ONN IOPAMIDOL (P90N7K-ONN) INJECTION 61%

[Series 2: abd/ pelvis 5.0 i30f 1 · axial · 0.89mm/px · z∈[+806,+1226]mm · 13 of 96 slices shown, 15 images]
[im 6/96  soft-tissue]
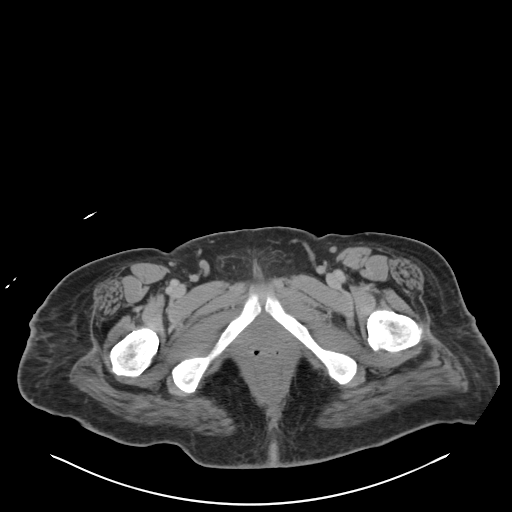
[im 6/96  bone]
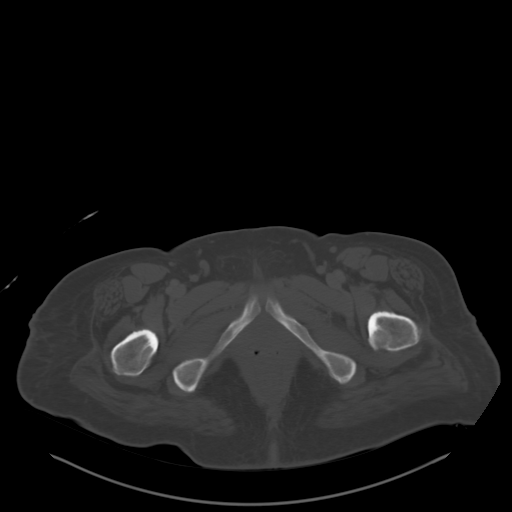
[im 11/96  soft-tissue]
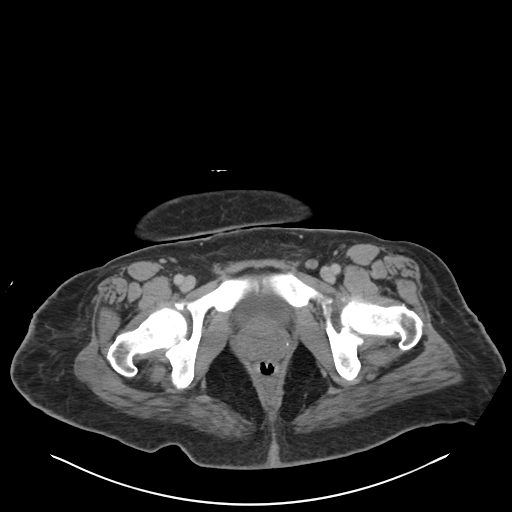
[im 22/96  soft-tissue]
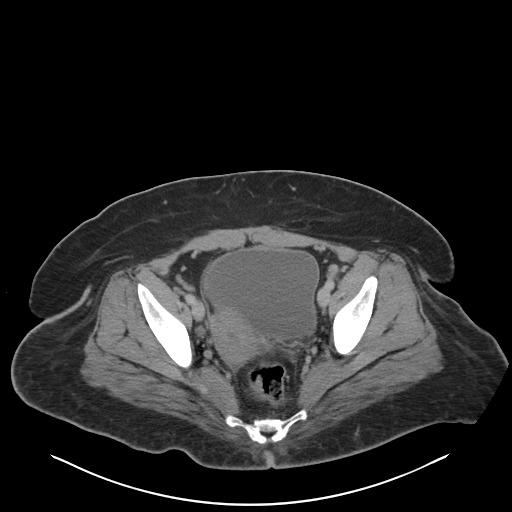
[im 27/96  soft-tissue]
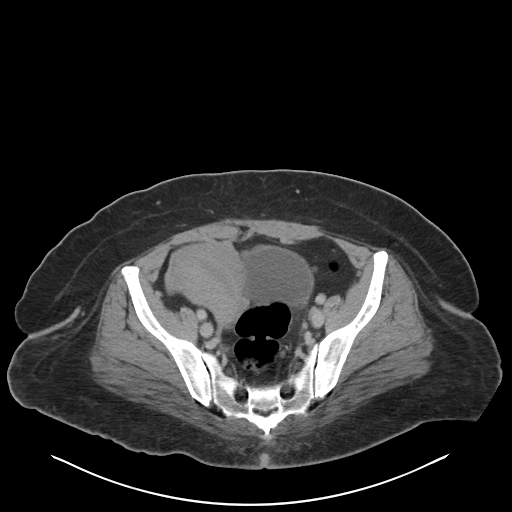
[im 32/96  soft-tissue]
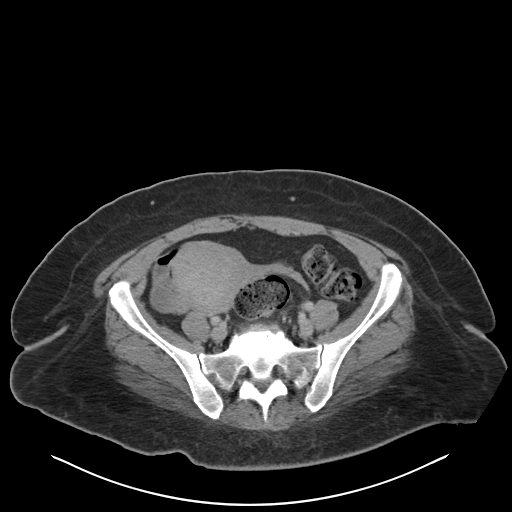
[im 43/96  soft-tissue]
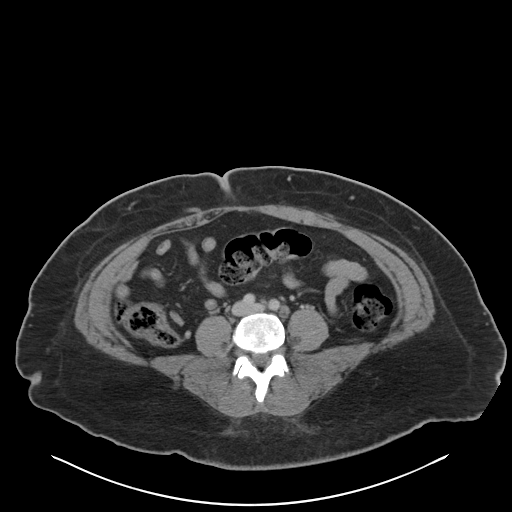
[im 48/96  soft-tissue]
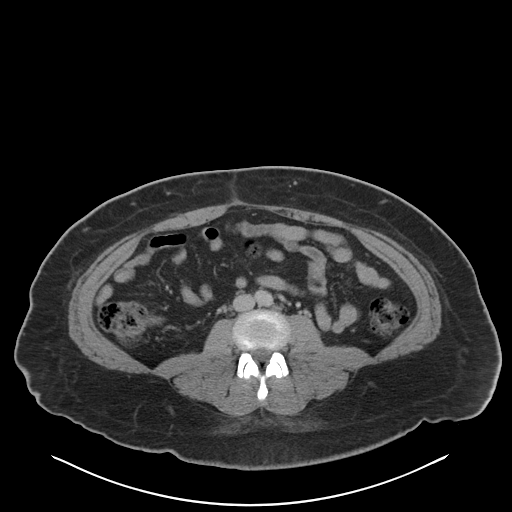
[im 53/96  soft-tissue]
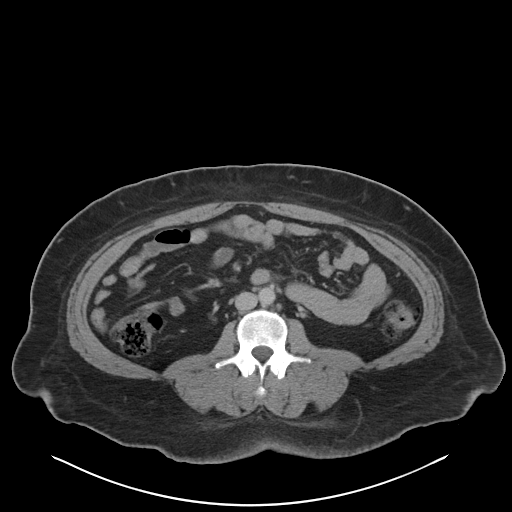
[im 64/96  soft-tissue]
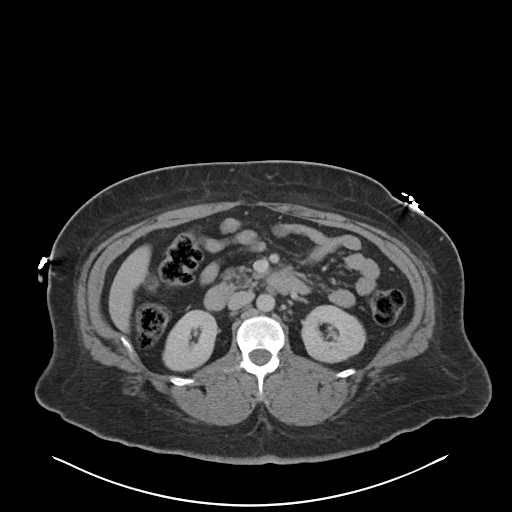
[im 64/96  bone]
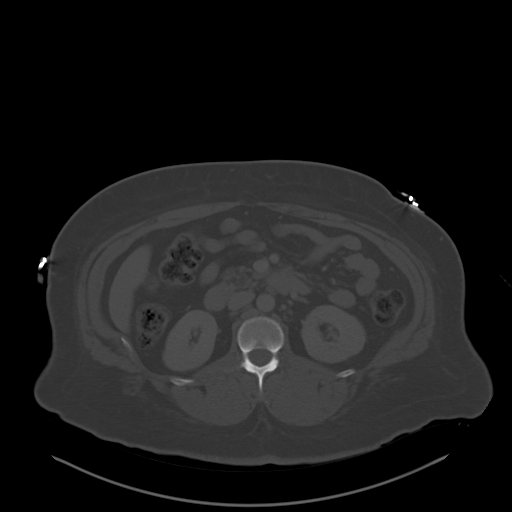
[im 69/96  soft-tissue]
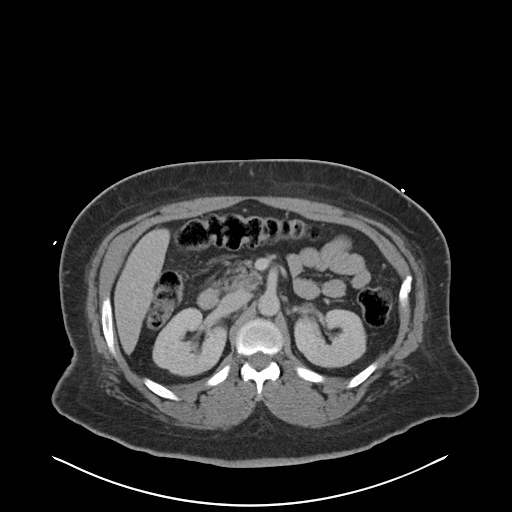
[im 74/96  soft-tissue]
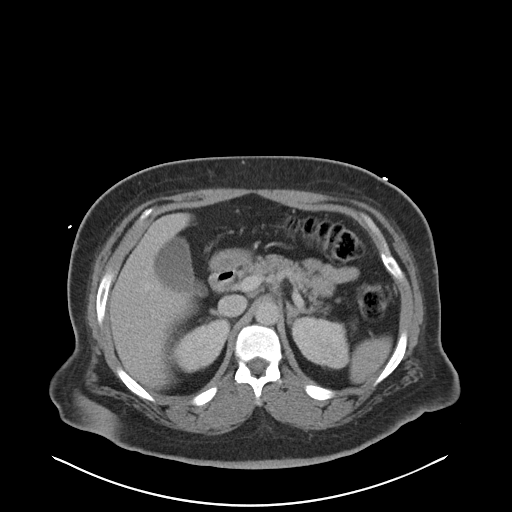
[im 85/96  soft-tissue]
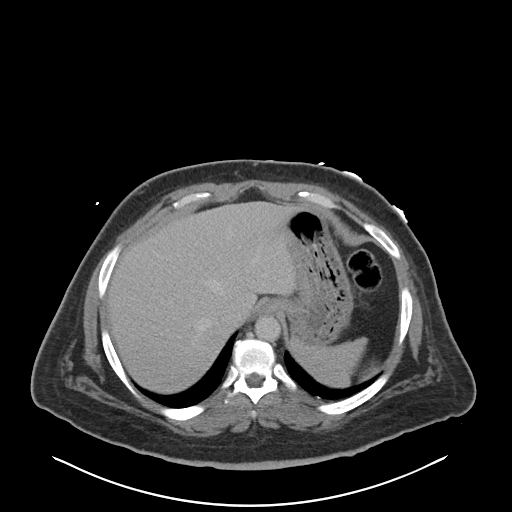
[im 90/96  soft-tissue]
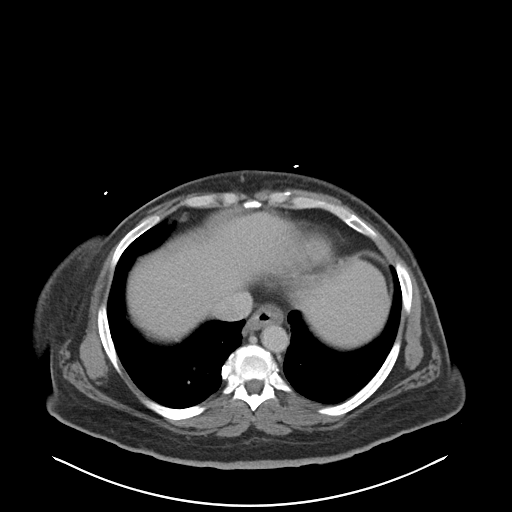

[Series 5: coronal soft tissue · coronal · 0.76mm/px · 3 of 89 slices shown]
[im 30/89  soft-tissue]
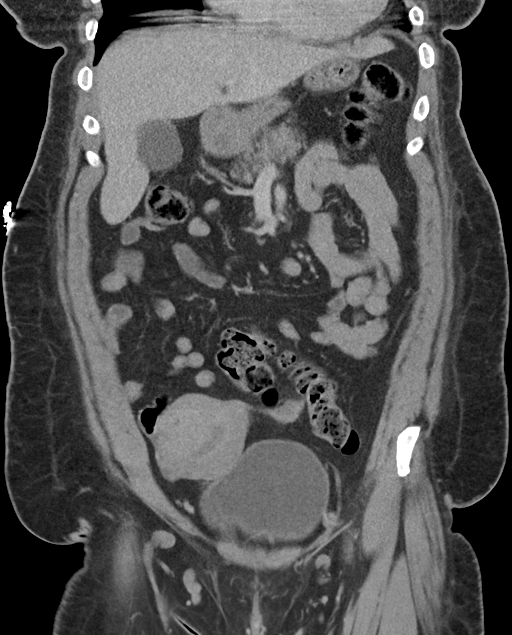
[im 40/89  soft-tissue]
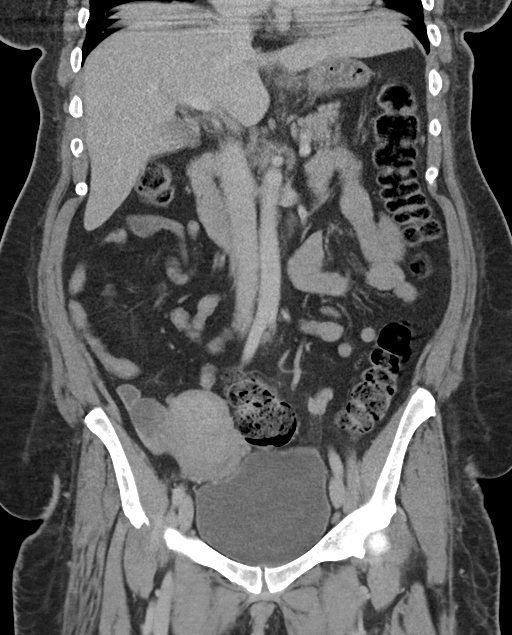
[im 49/89  soft-tissue]
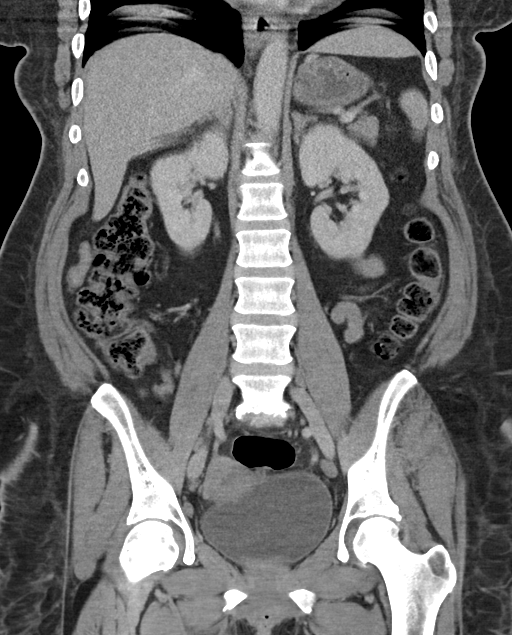

[16 of 46 positions shown; findings below may reference images not displayed]

FINDINGS: The visualized lung bases are clear. No intra-abdominal free air or
free fluid.

The liver, gallbladder, pancreas, spleen, adrenal glands, kidneys,
visualized ureters, and urinary bladder appear unremarkable. The
uterus is anteverted. A posterior uterine fibroid noted with
apparent indentation of the endometrial canal. Ultrasound may
provide better evaluation of the pelvic structures. A 2.5 cm right
ovarian dominant follicle or corpus luteum.

There is moderate stool throughout the colon. No evidence of bowel
obstruction or active inflammation. Normal appendix.

The abdominal aorta and IVC appear unremarkable. No portal venous
gas identified. The SMV, splenic vein, and main portal vein are
patent. There is no adenopathy.

There is mild diffuse subcutaneous soft tissue stranding. No fluid
collection. There is mild degenerative changes of the spine. L5-S1
disc desiccation with vacuum phenomena and endplate sclerosis. No
acute fracture.
IMPRESSION: No acute intra-abdominal or pelvic pathology.

## 2018-08-14 IMAGING — CT CT HEAD W/O CM
3 series · 14 of 47 positions shown, 16 images · non-contrast
Comparison: Brain CT 06/20/2012.

CLINICAL DATA: Patient with nausea, vomiting and chest pain.

EXAM:
CT HEAD WITHOUT CONTRAST
TECHNIQUE: Contiguous axial images were obtained from the base of the skull
through the vertex without intravenous contrast.

[Series 2: head 5.0 h30s · axial · 0.45mm/px · z∈[-135,-10]mm · 8 of 30 slices shown, 10 images]
[im 3/30  brain]
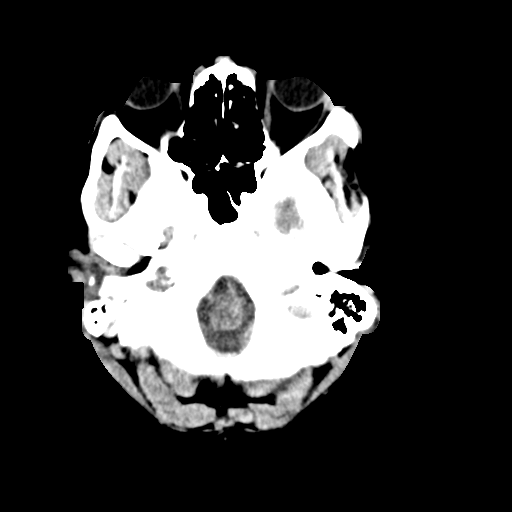
[im 3/30  bone]
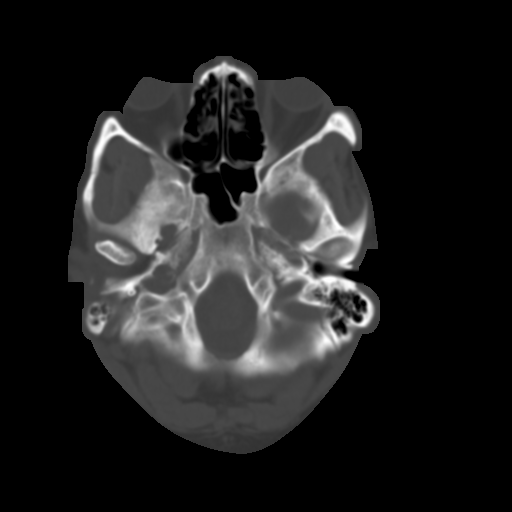
[im 7/30  brain]
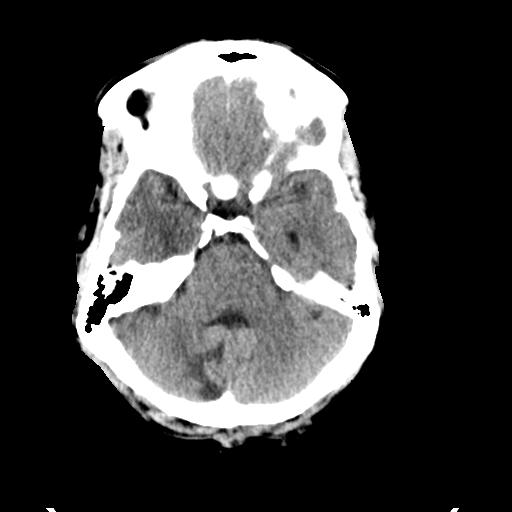
[im 10/30  brain]
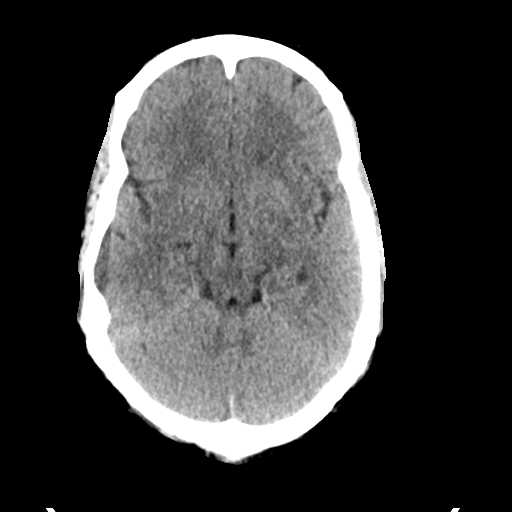
[im 14/30  brain]
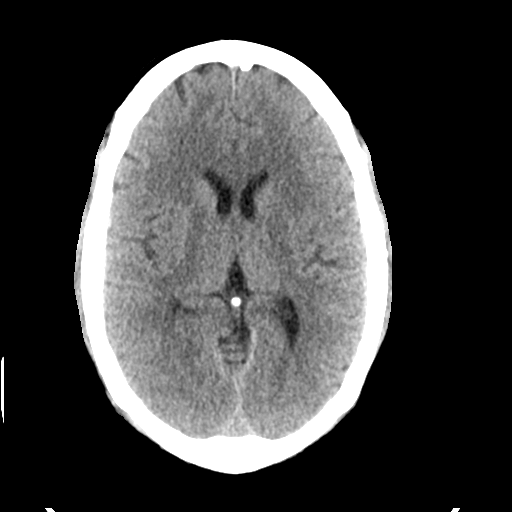
[im 17/30  brain]
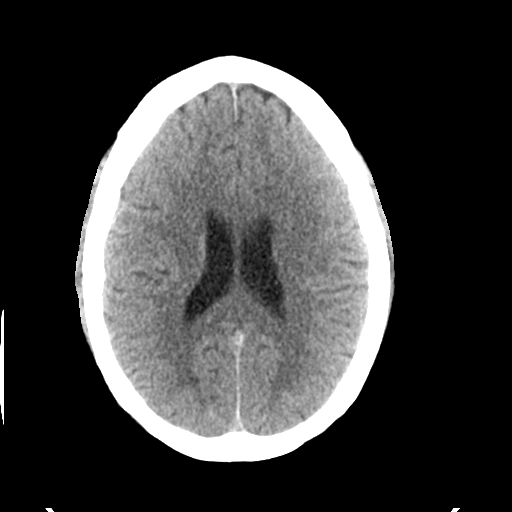
[im 17/30  bone]
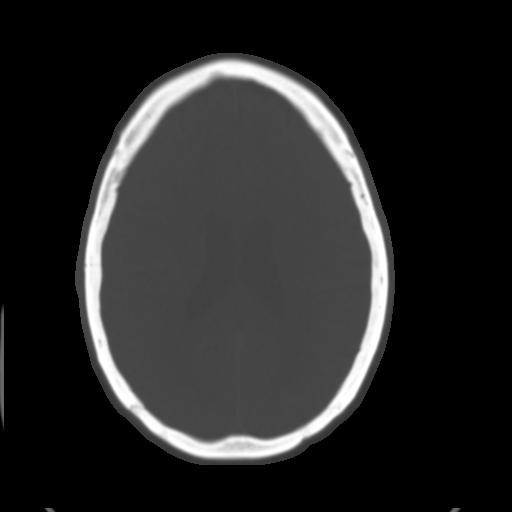
[im 21/30  brain]
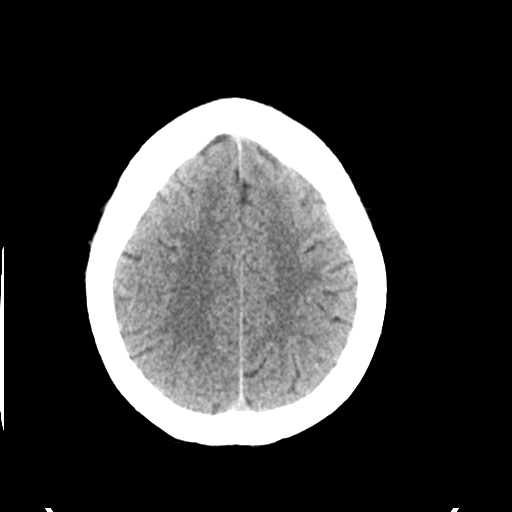
[im 24/30  brain]
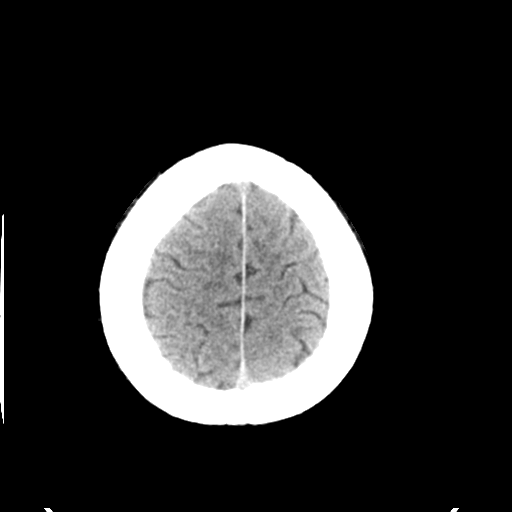
[im 28/30  brain]
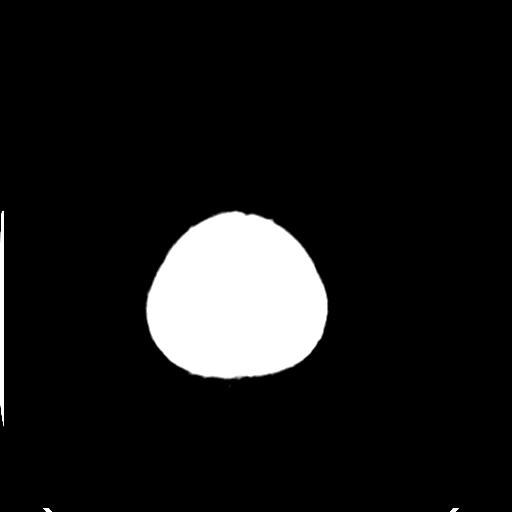

[Series 4: head 3.0 mpr cor · coronal · 0.32mm/px · 3 of 69 slices shown]
[im 23/69  brain]
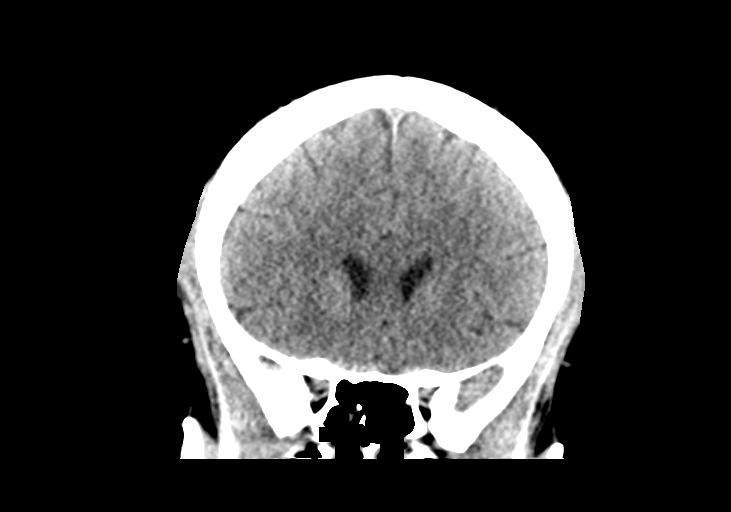
[im 31/69  brain]
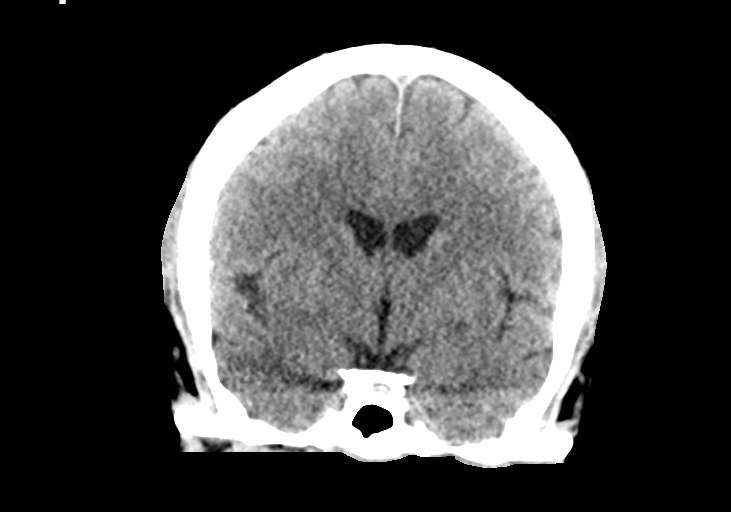
[im 38/69  brain]
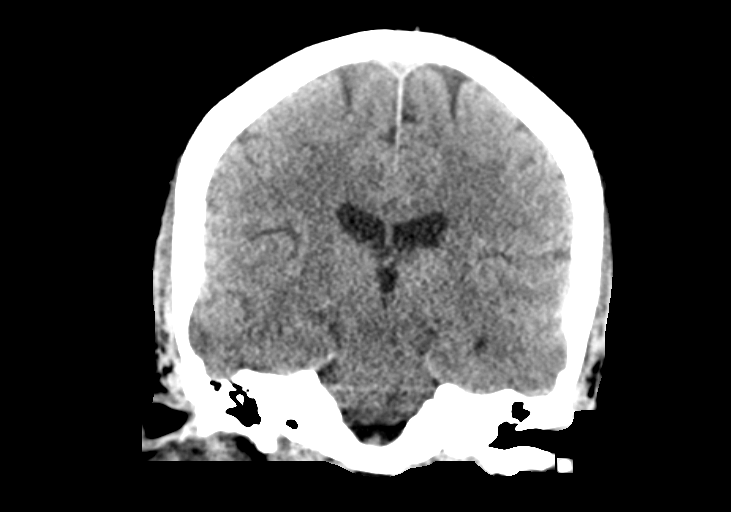

[Series 5: head 3.0 mpr · sagittal · 0.33mm/px · 3 of 46 slices shown]
[im 16/46  brain]
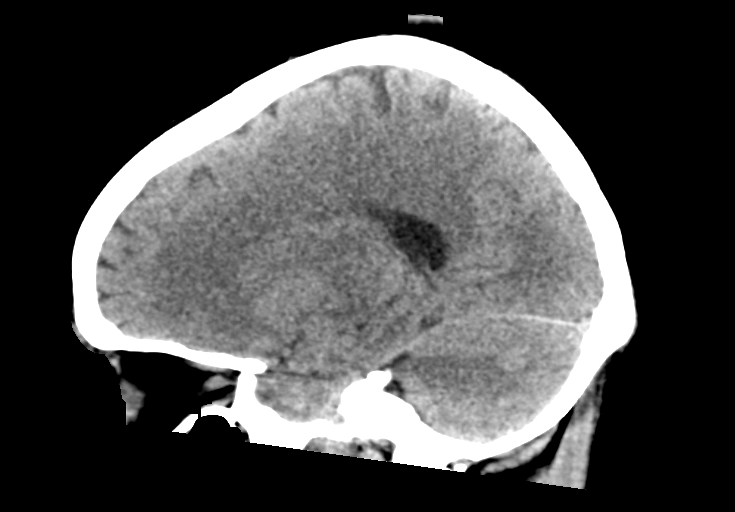
[im 23/46  brain]
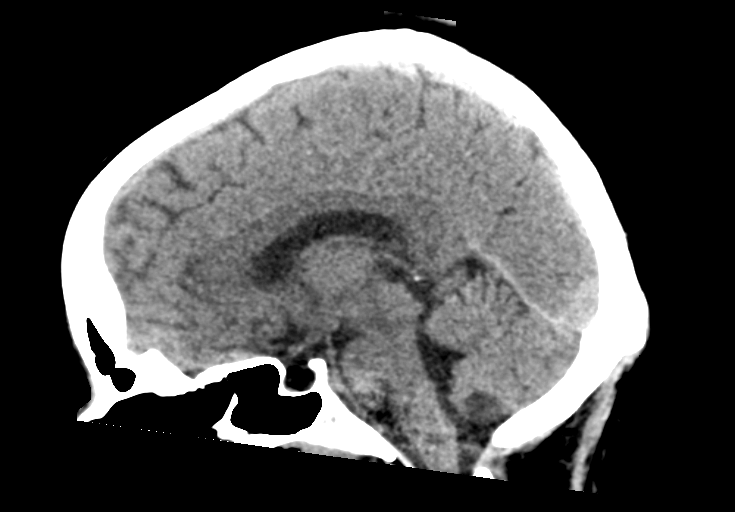
[im 31/46  brain]
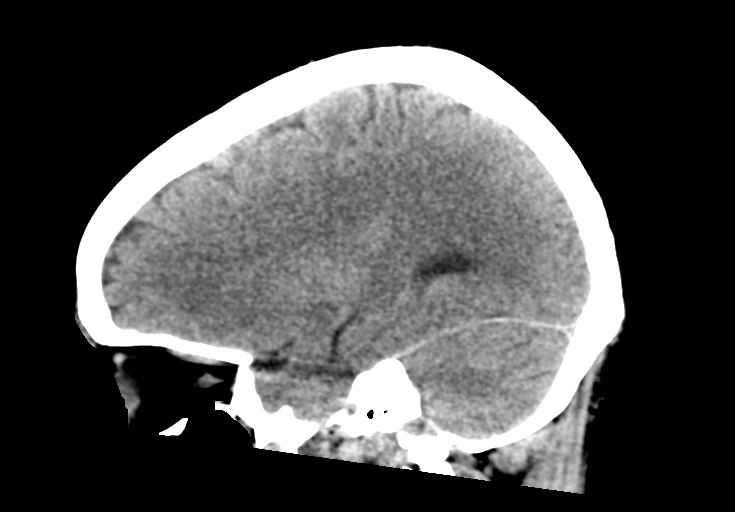

[14 of 47 positions shown; findings below may reference images not displayed]

FINDINGS: Brain: Ventricles and sulci are appropriate for patient's age. No
evidence for acute cortically based infarct, intracranial
hemorrhage, mass lesion or mass-effect. Encephalomalacia within the
right cerebellar hemisphere.

Vascular: Unremarkable

Skull: Intact.

Sinuses/Orbits: Mucosal thickening involving the ethmoid air cells.
No air-fluid levels. Mastoid air cells are unremarkable.

Other: None.
IMPRESSION: No acute intracranial process.

Old right cerebellar infarct.

## 2018-08-14 IMAGING — CR DG CHEST 2V
2 series · 2 of 2 positions shown · non-contrast
Comparison: Chest radiograph 11/27/2015.

CLINICAL DATA: Patient with nausea, vomiting, chest pain and
shortness of breath.

EXAM:
CHEST  2 VIEW

[chest pa]
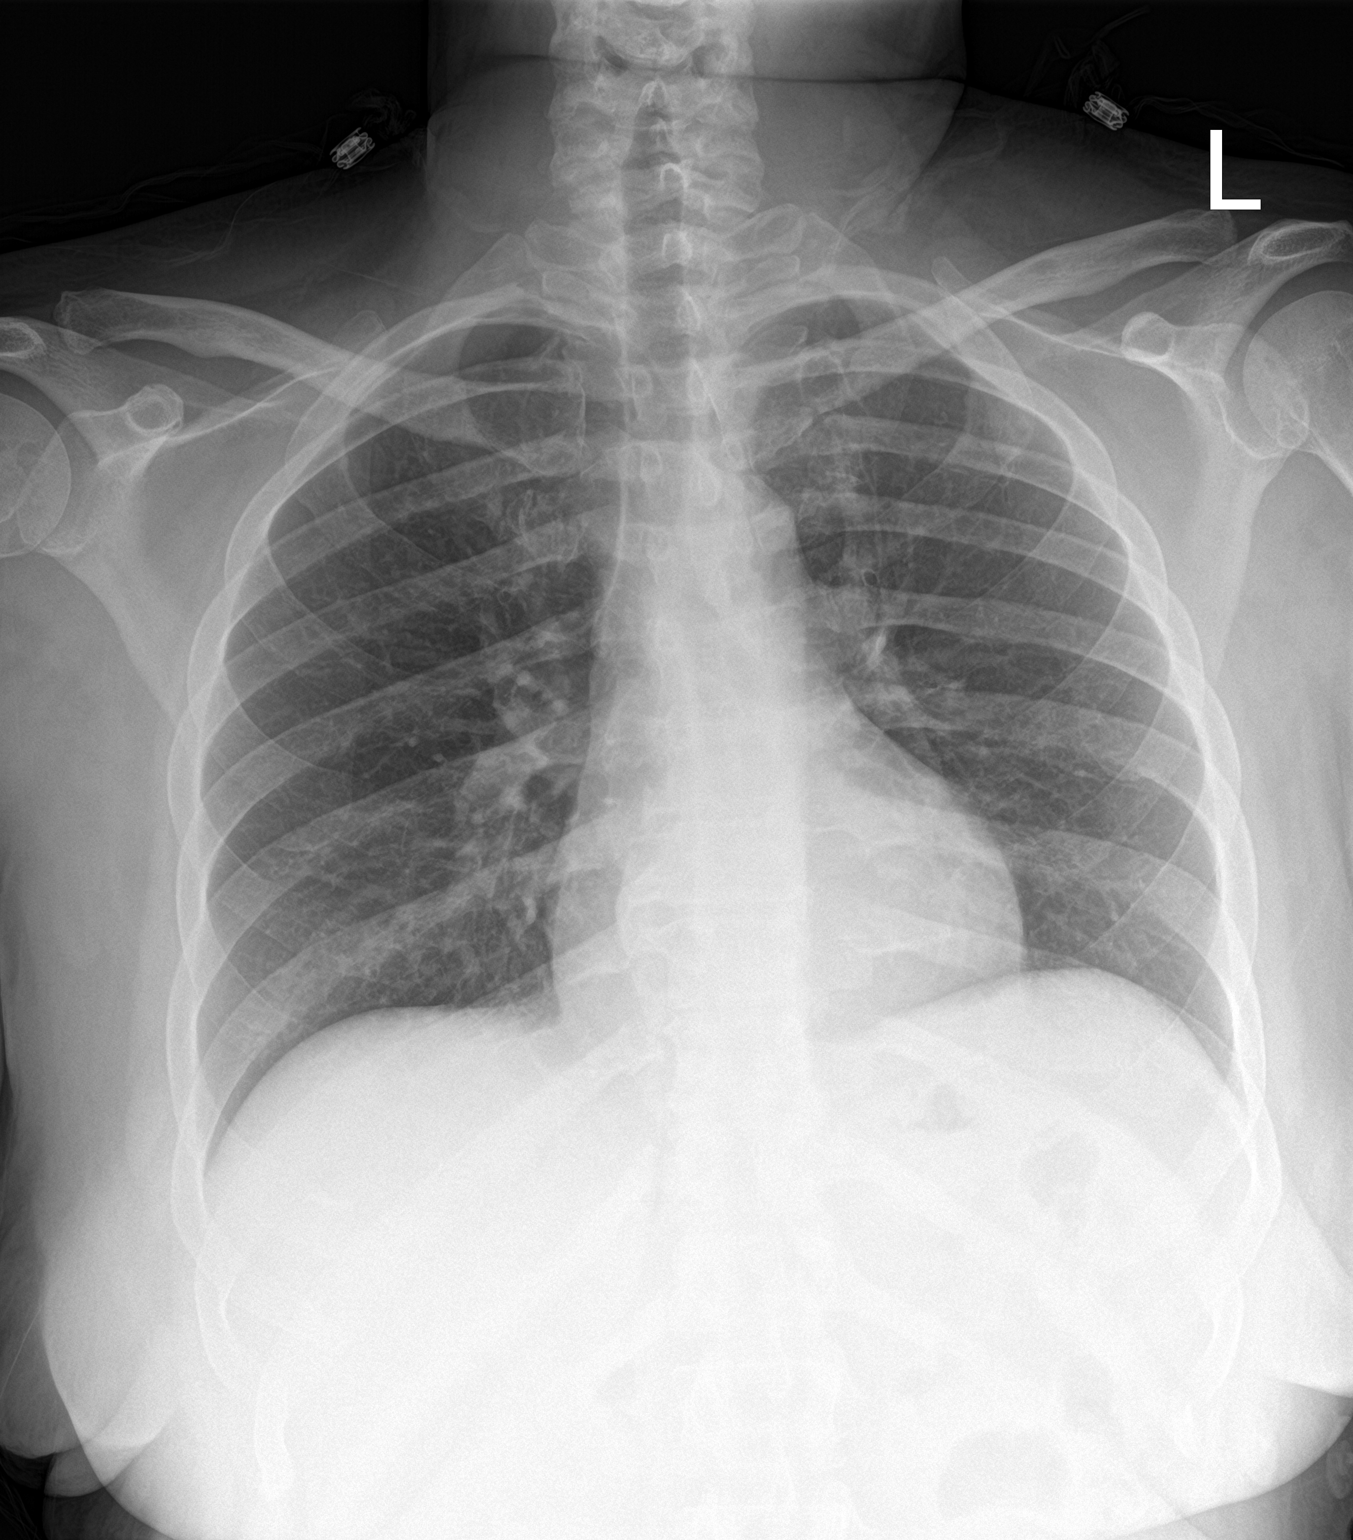

[chest lat]
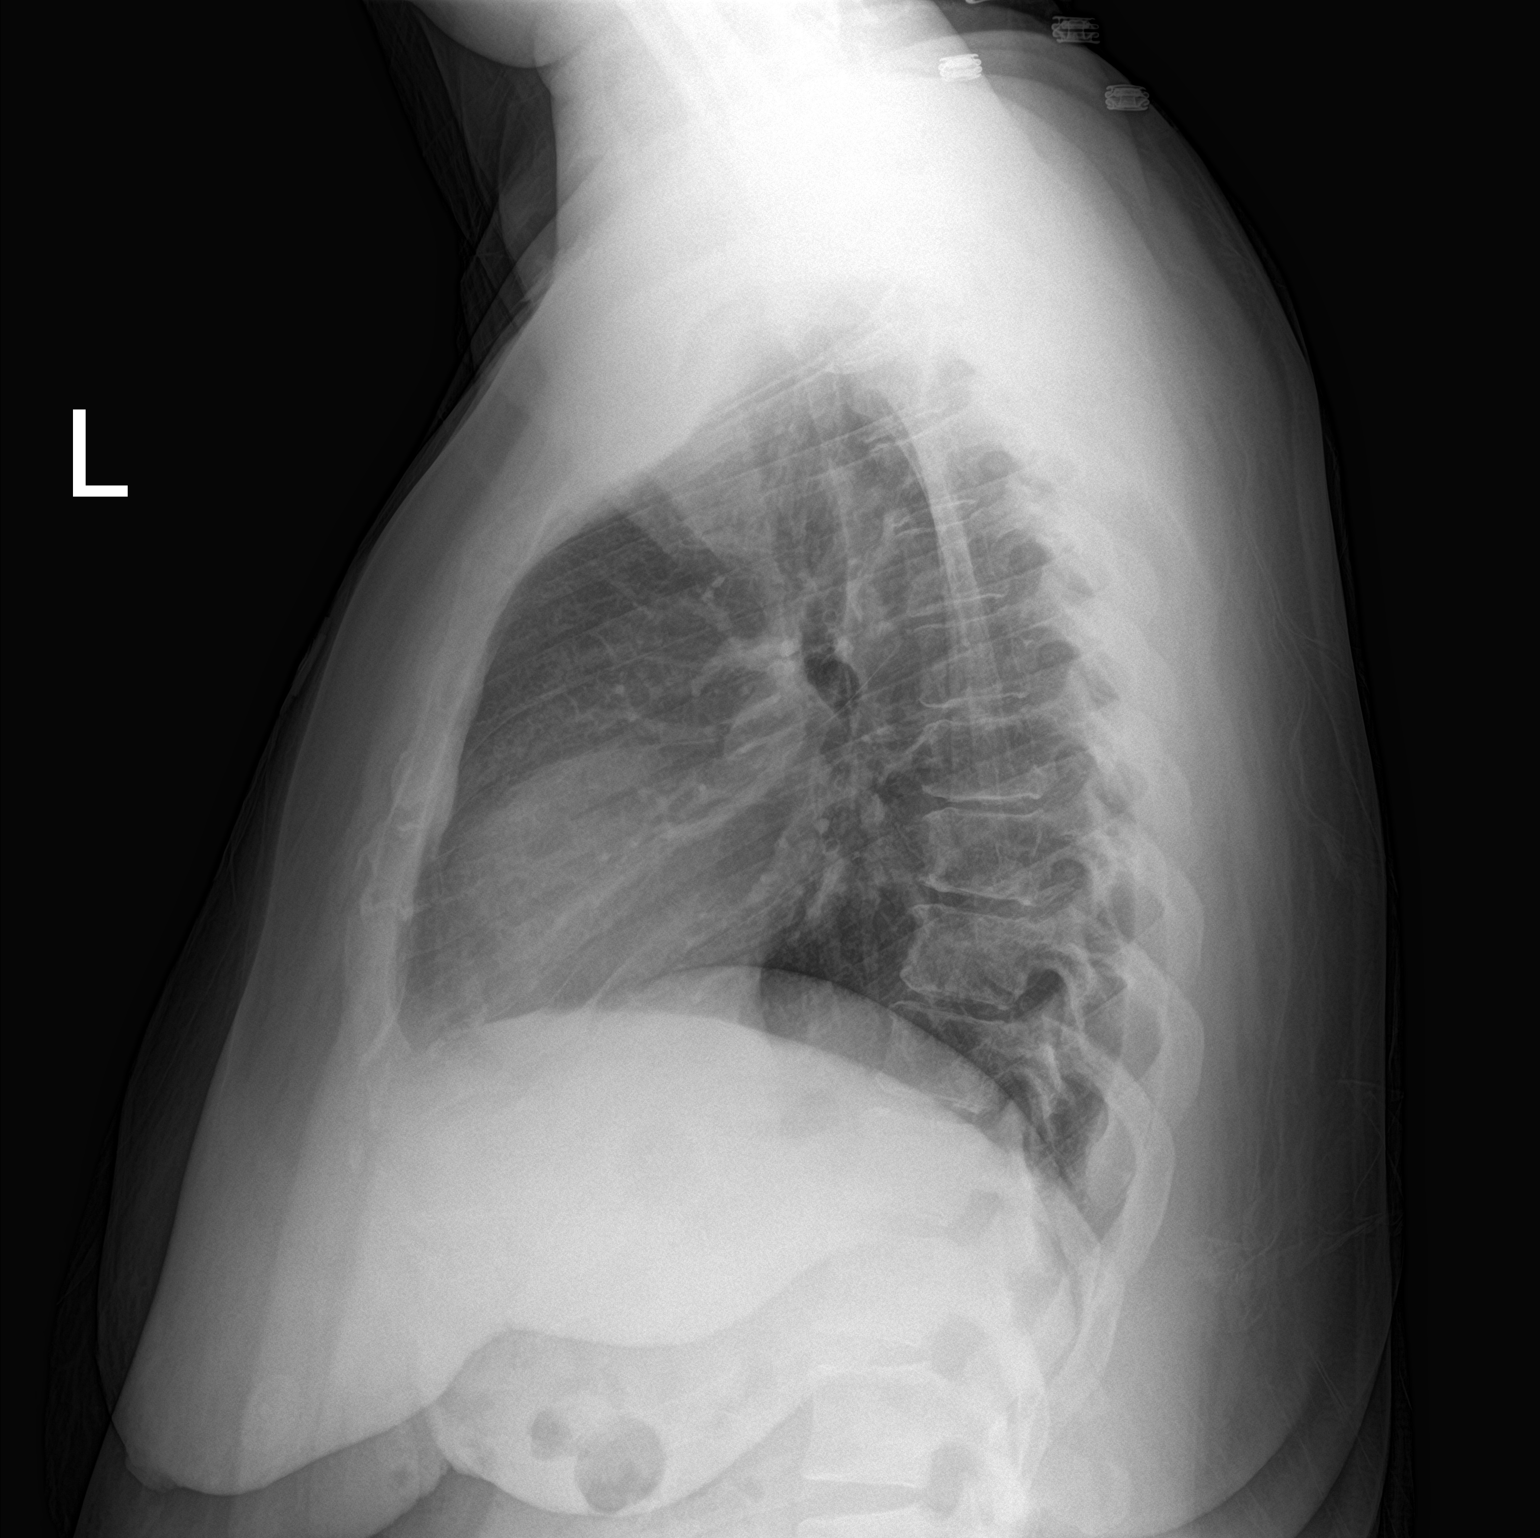

[2 of 2 positions shown; findings below may reference images not displayed]

FINDINGS: The heart size and mediastinal contours are within normal limits.
Both lungs are clear. The visualized skeletal structures are
unremarkable.
IMPRESSION: No active cardiopulmonary disease.

## 2018-08-24 IMAGING — DX DG CHEST 2V
2 series · 2 of 2 positions shown · non-contrast
Comparison: None.

CLINICAL DATA: Chest pain.  Nausea and vomiting

EXAM:
CHEST  2 VIEW

[chest lat]
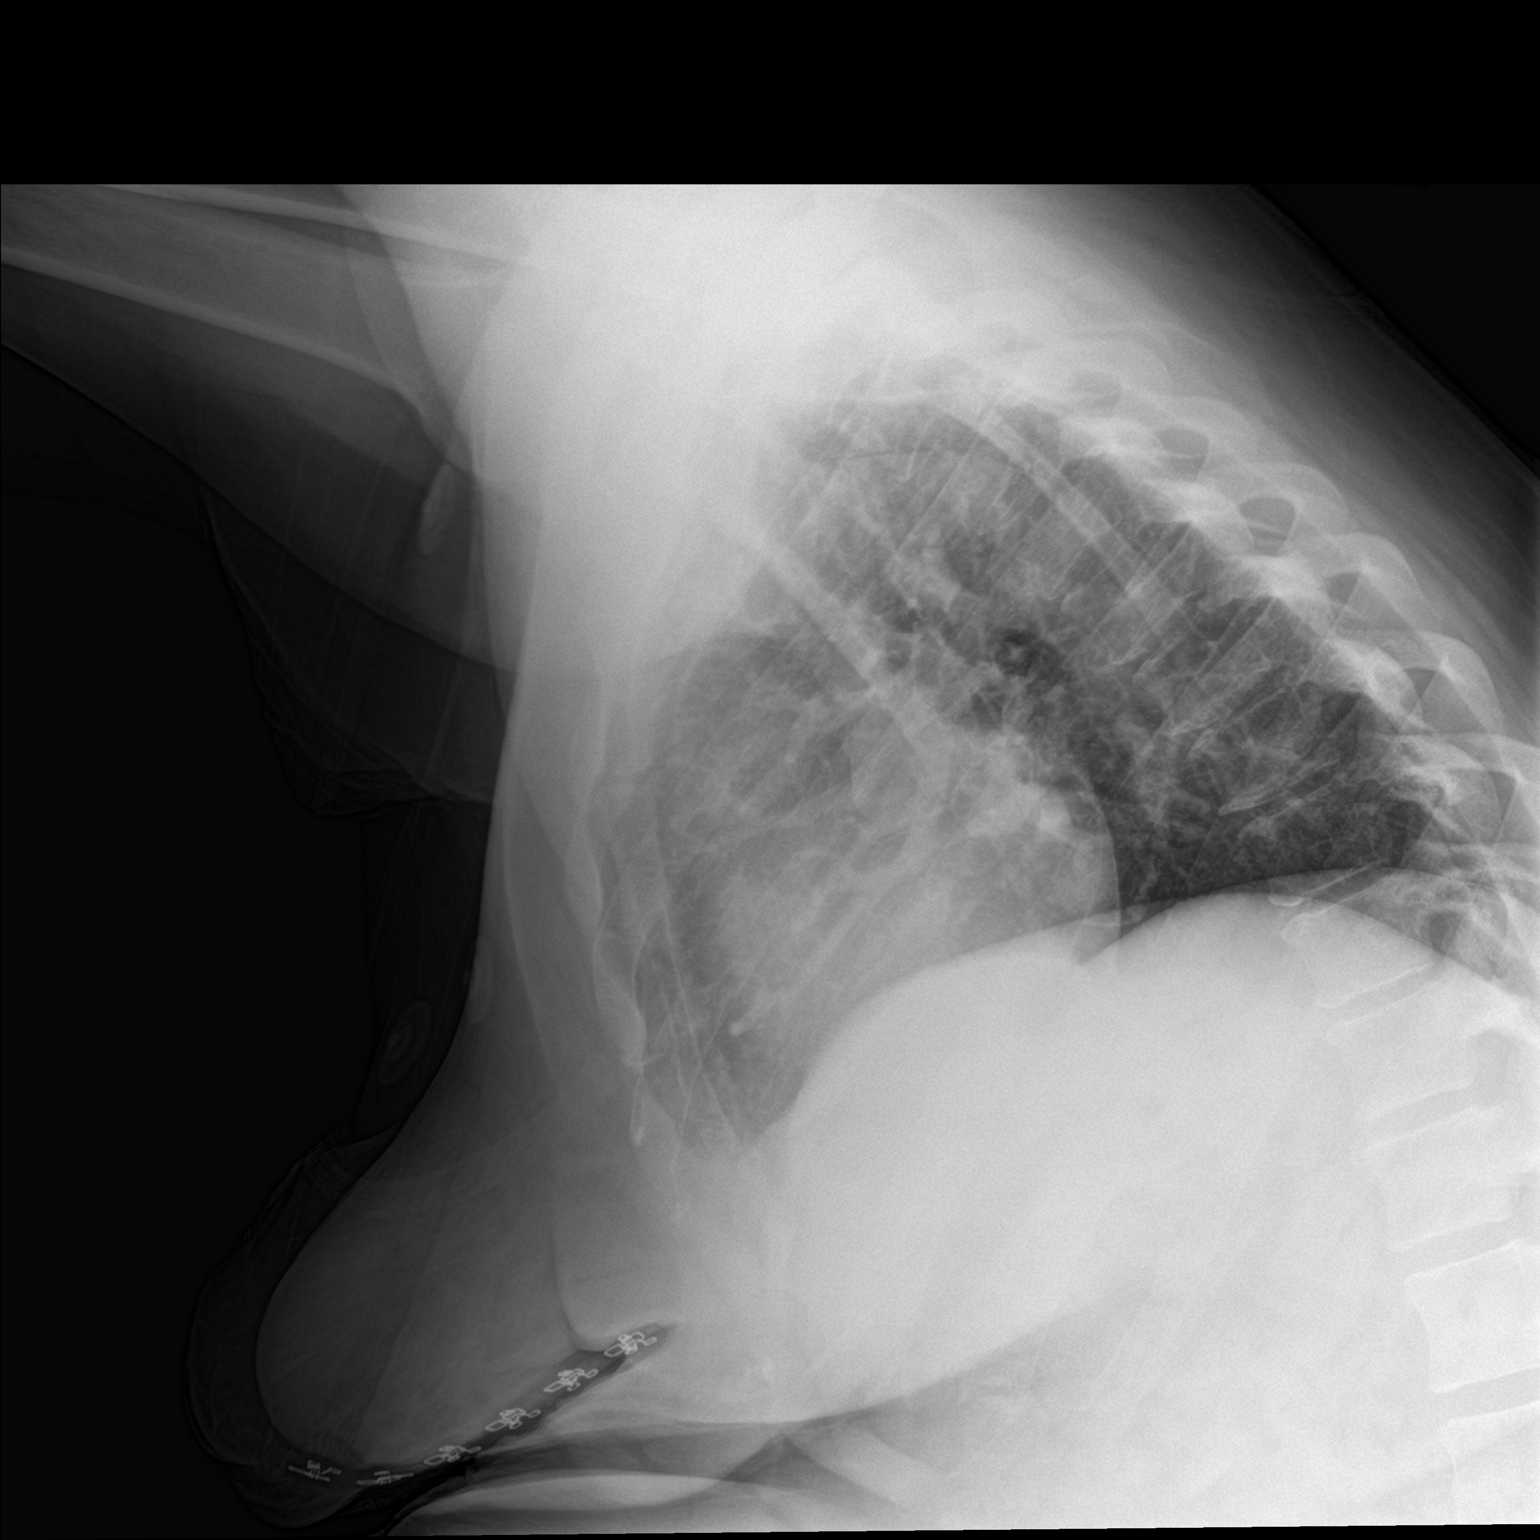

[chest ap]
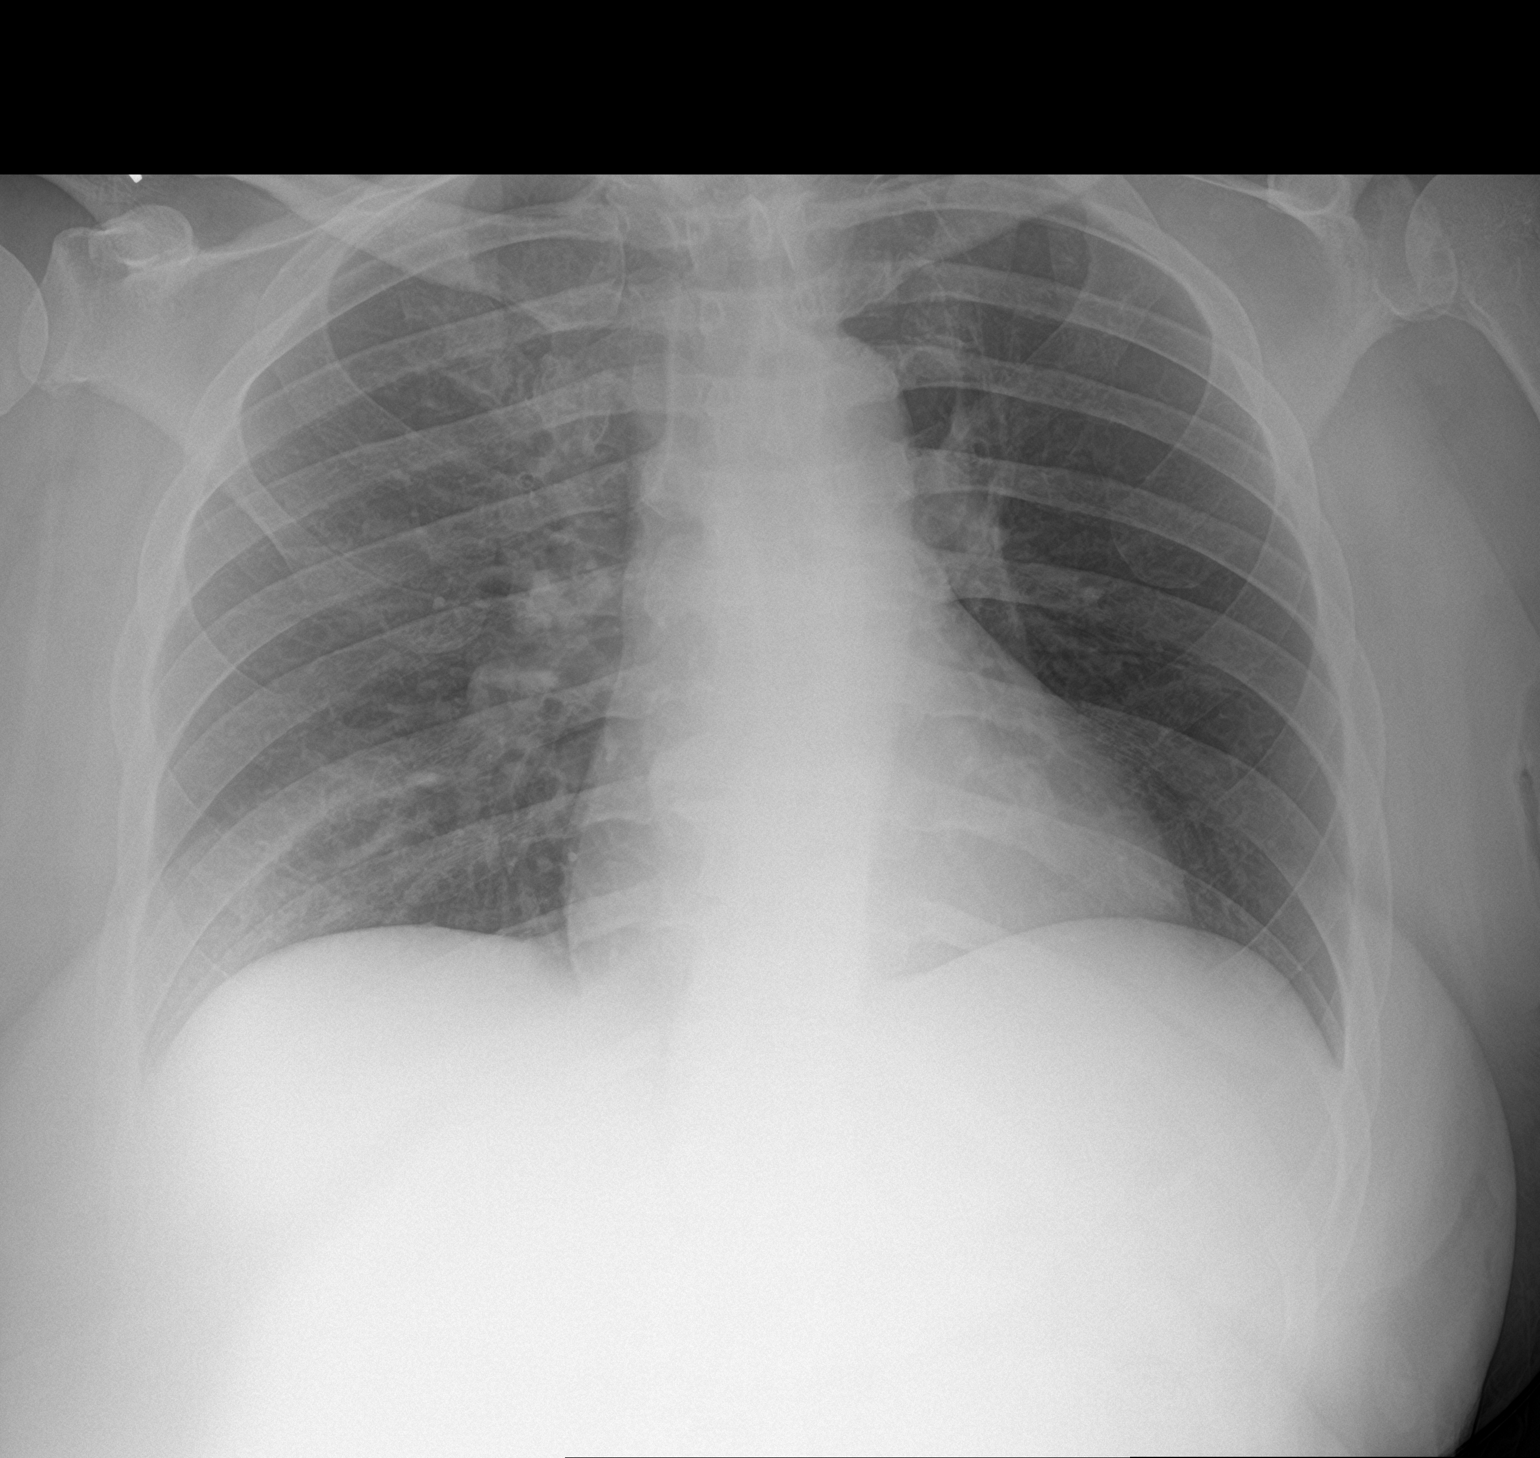

[2 of 2 positions shown; findings below may reference images not displayed]

FINDINGS: The heart size and mediastinal contours are within normal limits.
Both lungs are clear. The visualized skeletal structures are
unremarkable.
IMPRESSION: No active cardiopulmonary disease.

## 2018-09-10 IMAGING — CR DG CHEST 2V
2 series · 2 of 2 positions shown · non-contrast
Comparison: 01/19/2016 chest radiating

CLINICAL DATA: 40 y/o F; nausea, vomiting, and chest pain starting
a few hours ago.

EXAM:
CHEST  2 VIEW

[chest pa]
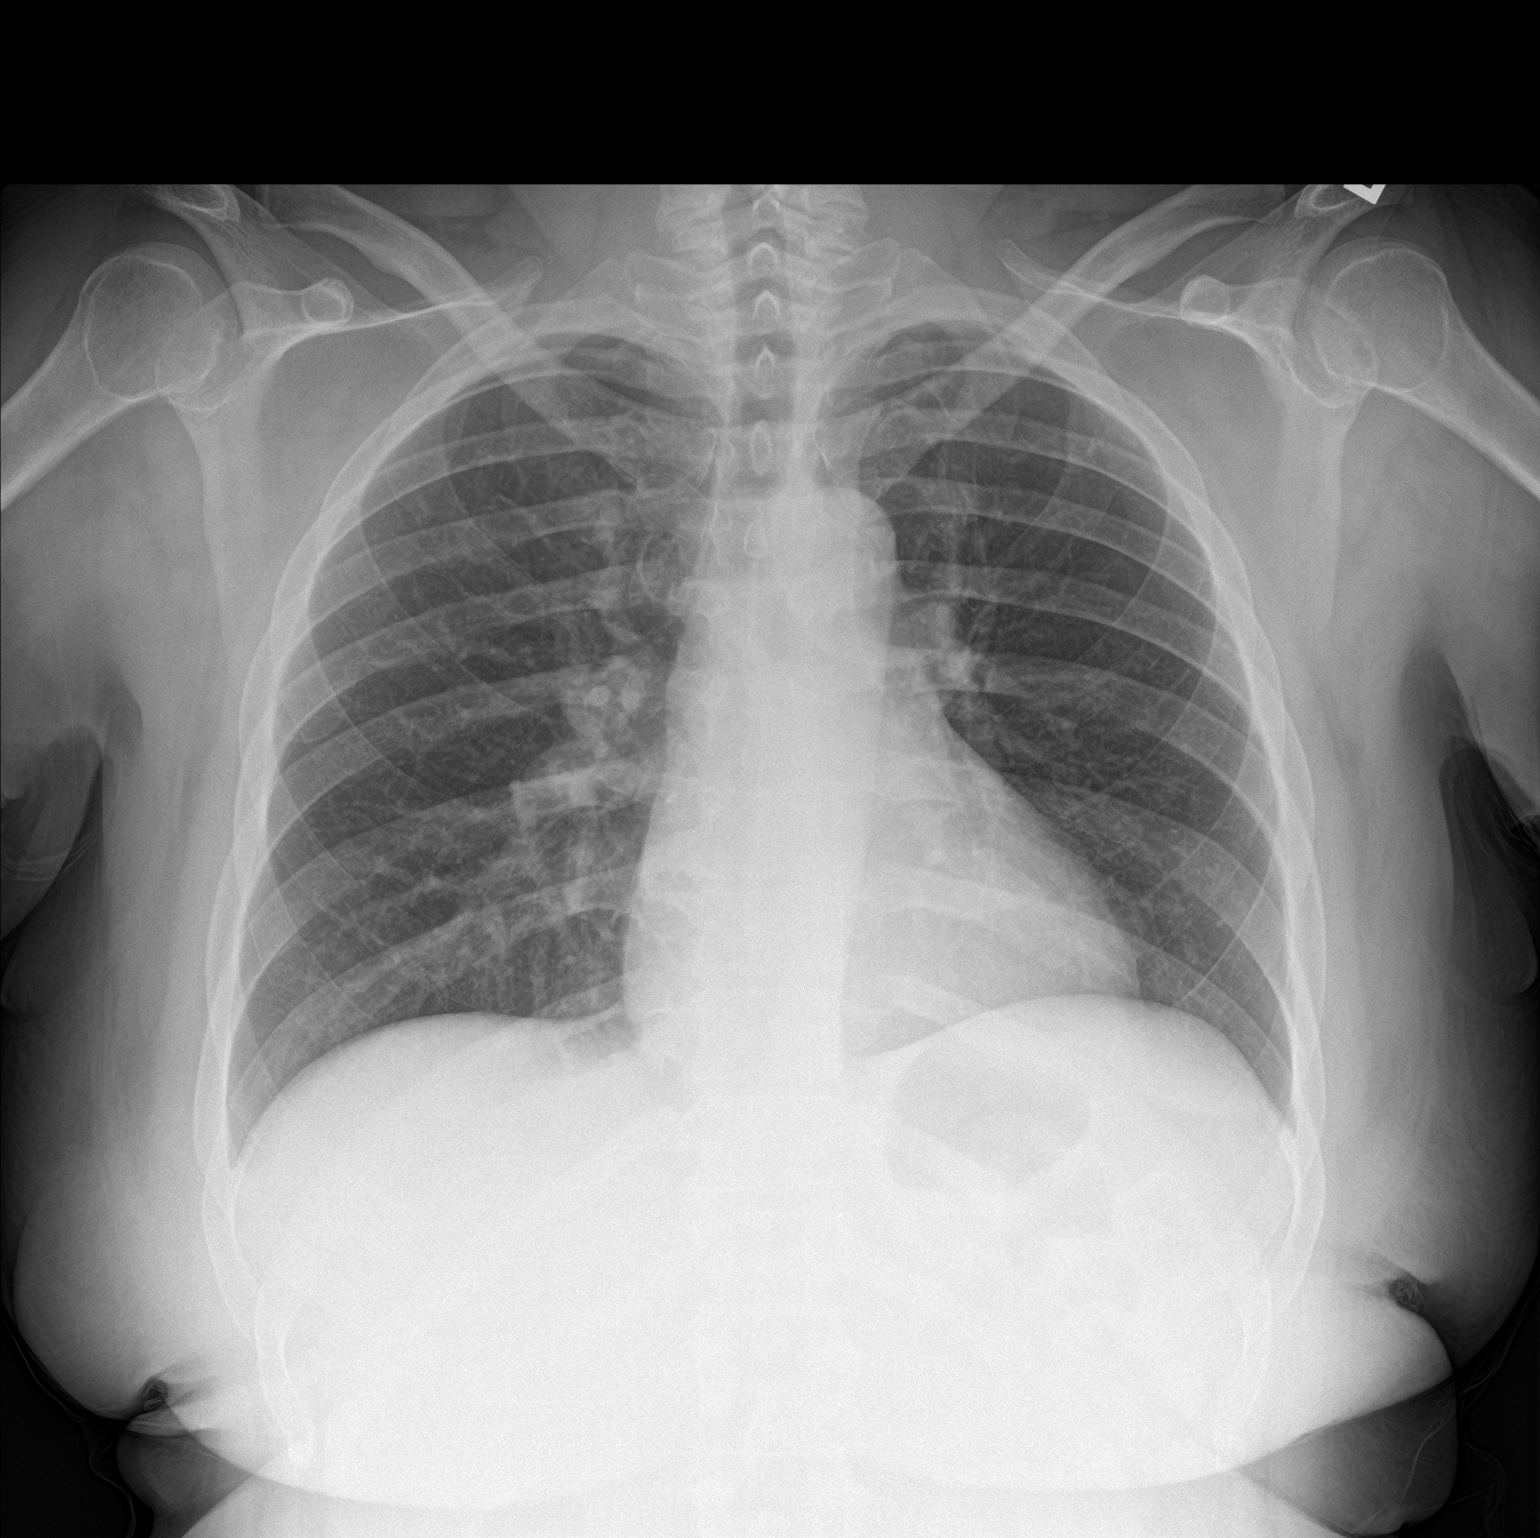

[chest lat]
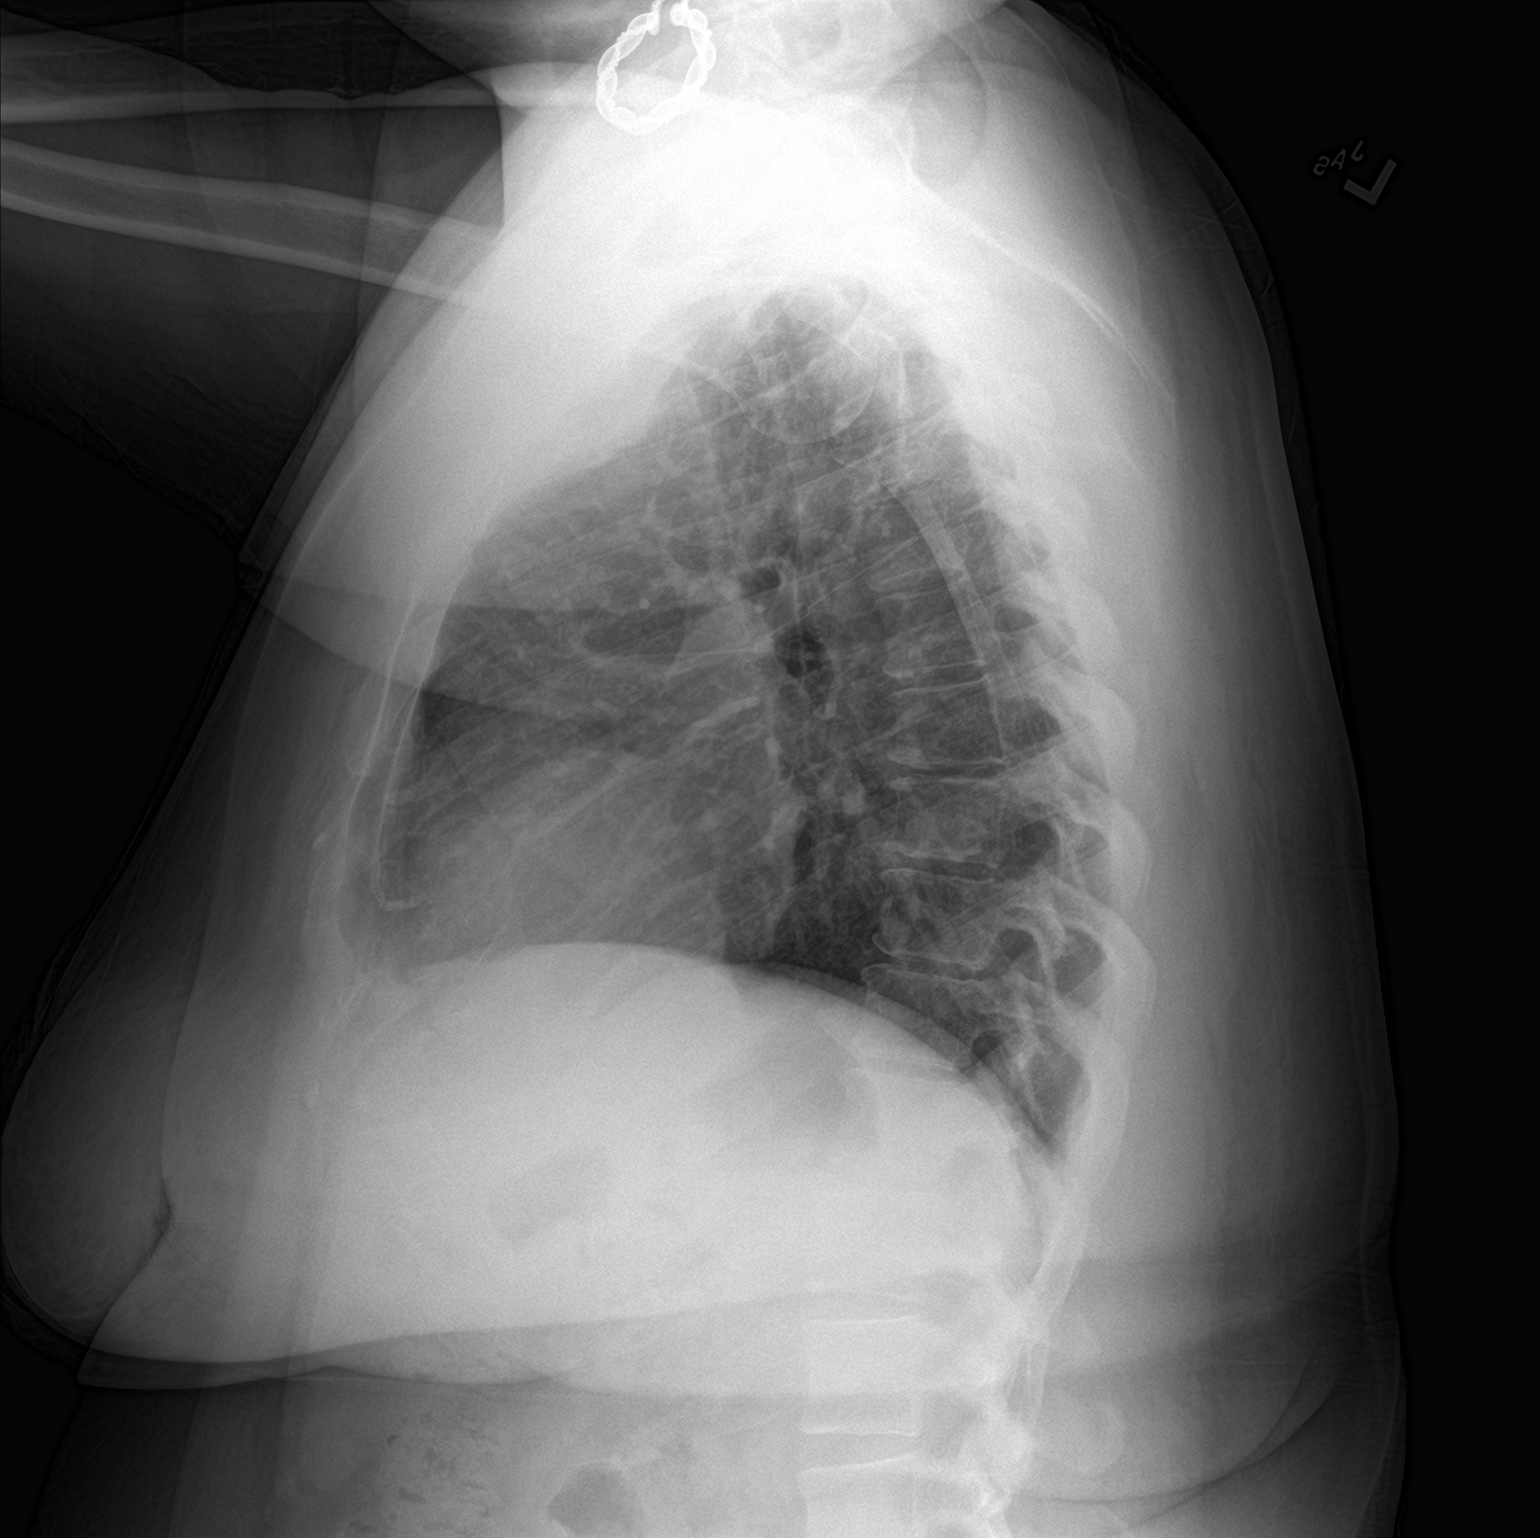

[2 of 2 positions shown; findings below may reference images not displayed]

FINDINGS: The heart size and mediastinal contours are within normal limits and
stable. Both lungs are clear. The visualized skeletal structures are
unremarkable.
IMPRESSION: No active cardiopulmonary disease.

By: Paulus N Ceejay M.D.

## 2018-09-11 ENCOUNTER — Other Ambulatory Visit: Payer: Self-pay

## 2018-09-11 ENCOUNTER — Emergency Department (HOSPITAL_COMMUNITY)
Admission: EM | Admit: 2018-09-11 | Discharge: 2018-09-11 | Disposition: A | Discharge status: Home / Self Care | Payer: Medicare Other | Attending: Emergency Medicine | Admitting: Emergency Medicine

## 2018-09-11 ENCOUNTER — Encounter (HOSPITAL_COMMUNITY): Payer: Self-pay | Admitting: Emergency Medicine

## 2018-09-11 DIAGNOSIS — I252 Old myocardial infarction: Secondary | ICD-10-CM | POA: Diagnosis not present

## 2018-09-11 DIAGNOSIS — N611 Abscess of the breast and nipple: Secondary | ICD-10-CM

## 2018-09-11 DIAGNOSIS — B181 Chronic viral hepatitis B without delta-agent: Secondary | ICD-10-CM | POA: Insufficient documentation

## 2018-09-11 DIAGNOSIS — E119 Type 2 diabetes mellitus without complications: Secondary | ICD-10-CM | POA: Diagnosis not present

## 2018-09-11 DIAGNOSIS — I2510 Atherosclerotic heart disease of native coronary artery without angina pectoris: Secondary | ICD-10-CM | POA: Diagnosis not present

## 2018-09-11 DIAGNOSIS — K3184 Gastroparesis: Secondary | ICD-10-CM

## 2018-09-11 DIAGNOSIS — Z8673 Personal history of transient ischemic attack (TIA), and cerebral infarction without residual deficits: Secondary | ICD-10-CM | POA: Insufficient documentation

## 2018-09-11 DIAGNOSIS — R1010 Upper abdominal pain, unspecified: Secondary | ICD-10-CM | POA: Diagnosis present

## 2018-09-11 DIAGNOSIS — Z794 Long term (current) use of insulin: Secondary | ICD-10-CM | POA: Diagnosis not present

## 2018-09-11 DIAGNOSIS — I10 Essential (primary) hypertension: Secondary | ICD-10-CM | POA: Diagnosis not present

## 2018-09-11 LAB — I-STAT BETA HCG BLOOD, ED (MC, WL, AP ONLY): I-stat hCG, quantitative: <5 m[IU]/mL (ref <5)

## 2018-09-11 LAB — COMPREHENSIVE METABOLIC PANEL
ALT: 9 U/L (ref 0–44)
AST: 13 U/L — ABNORMAL LOW (ref 15–41)
Albumin: 2.8 g/dL — ABNORMAL LOW (ref 3.5–5.0)
Alkaline Phosphatase: 65 U/L (ref 38–126)
Anion gap: 10 (ref 5–15)
BUN: 14 mg/dL (ref 6–20)
CO2: 22 mmol/L (ref 22–32)
Calcium: 8.9 mg/dL (ref 8.9–10.3)
Chloride: 100 mmol/L (ref 98–111)
Creatinine, Ser: 1.14 mg/dL — ABNORMAL HIGH (ref 0.44–1.00)
GFR calc Af Amer: >60 mL/min (ref >60)
GFR calc non Af Amer: 58 mL/min — ABNORMAL LOW (ref >60)
Glucose, Bld: 479 mg/dL — ABNORMAL HIGH (ref 70–99)
Potassium: 4.3 mmol/L (ref 3.5–5.1)
Sodium: 132 mmol/L — ABNORMAL LOW (ref 135–145)
Total Bilirubin: 0.4 mg/dL (ref 0.3–1.2)
Total Protein: 7.3 g/dL (ref 6.5–8.1)

## 2018-09-11 LAB — URINALYSIS, ROUTINE W REFLEX MICROSCOPIC
Bilirubin Urine: NEGATIVE
Glucose, UA: >=500 mg/dL — AB
Ketones, ur: NEGATIVE mg/dL
Leukocytes,Ua: NEGATIVE
Nitrite: NEGATIVE
Protein, ur: 30 mg/dL — AB
RBC / HPF: >50 RBC/hpf — ABNORMAL HIGH (ref 0–5)
Specific Gravity, Urine: 1.025 (ref 1.005–1.030)
pH: 5 (ref 5.0–8.0)

## 2018-09-11 LAB — CBC
HCT: 36.2 % (ref 36.0–46.0)
Hemoglobin: 12.4 g/dL (ref 12.0–15.0)
MCH: 26.4 pg (ref 26.0–34.0)
MCHC: 34.3 g/dL (ref 30.0–36.0)
MCV: 77.2 fL — ABNORMAL LOW (ref 80.0–100.0)
Platelets: 407 10*3/uL — ABNORMAL HIGH (ref 150–400)
RBC: 4.69 MIL/uL (ref 3.87–5.11)
RDW: 15.1 % (ref 11.5–15.5)
WBC: 13.9 10*3/uL — ABNORMAL HIGH (ref 4.0–10.5)
nRBC: 0 % (ref 0.0–0.2)

## 2018-09-11 LAB — LIPASE, BLOOD: Lipase: 34 U/L (ref 11–51)

## 2018-09-11 MED ORDER — SODIUM CHLORIDE 0.9 % IV BOLUS
2000.0000 mL | Freq: Once | INTRAVENOUS | Status: AC
  Administered 2018-09-11: 18:00:00 2000 mL via INTRAVENOUS

## 2018-09-11 MED ORDER — DOXYCYCLINE HYCLATE 100 MG PO CAPS: 100.0000 mg | ORAL_CAPSULE | Freq: Two times a day (BID) | ORAL | 0 refills | Status: DC

## 2018-09-11 MED ORDER — LORAZEPAM 2 MG/ML IJ SOLN: 1.0000 mg | Freq: Once | INTRAMUSCULAR | Status: DC

## 2018-09-11 MED ORDER — SODIUM CHLORIDE 0.9% FLUSH
3.0000 mL | Freq: Once | INTRAVENOUS | Status: AC
  Administered 2018-09-11: 3 mL via INTRAVENOUS

## 2018-09-11 MED ORDER — IBUPROFEN 400 MG PO TABS
400.0000 mg | ORAL_TABLET | Freq: Once | ORAL | Status: AC | PRN
  Administered 2018-09-11: 400 mg via ORAL
  Filled 2018-09-11: qty 1

## 2018-09-11 MED ORDER — METOCLOPRAMIDE HCL 5 MG/ML IJ SOLN
10.0000 mg | Freq: Once | INTRAMUSCULAR | Status: AC
  Administered 2018-09-11: 10 mg via INTRAVENOUS
  Filled 2018-09-11: qty 2

## 2018-09-11 MED ORDER — ONDANSETRON 4 MG PO TBDP
4.0000 mg | ORAL_TABLET | Freq: Once | ORAL | Status: AC | PRN
  Administered 2018-09-11: 4 mg via ORAL
  Filled 2018-09-11: qty 1

## 2018-09-11 MED ORDER — HALOPERIDOL LACTATE 5 MG/ML IJ SOLN: 5.0000 mg | Freq: Once | INTRAMUSCULAR | Status: DC

## 2018-09-11 NOTE — Discharge Instructions (Signed)
Return here as needed. Follow up with the breast center. They will call you with an appointment. Use warm compresses over the area.

## 2018-09-11 NOTE — ED Triage Notes (Signed)
Patient reports gastroparesis flare up x 4-5 days with N/V, R-sided abdominal pain. Denies fevers/chills, diarrhea, urinary symptoms. Also c/o pain under R breast - abscess x 1 month that has increased in size and now has purulent discharge.

## 2018-09-11 NOTE — ED Notes (Signed)
Patient verbalizes understanding of discharge instructions. Opportunity for questioning and answers were provided. Armband removed by staff, pt discharged from ED.  

## 2018-09-12 NOTE — ED Provider Notes (Signed)
[facility] Provider Note   CSN: [id number] Arrival date & time: 09/11/18  1306    History   Chief Complaint Chief Complaint  Patient presents with  . Gastroparesis    HPI Storie [facility] [name] is a 43 y.o. female.     HPI Patient presents to the emergency department with a flareup of her gastroparesis over the last 4 days.  The patient states she is also had upper abdominal discomfort with this which is typical for her gastroparesis.  The patient states she is also had pain and swelling with abscess to the right breast.  Patient states there is drainage coming from the area.  The patient denies chest pain, shortness of breath, headache,blurred vision, neck pain, fever, cough, weakness, numbness, dizziness, anorexia, edema,diarrhea, rash, back pain, dysuria, hematemesis, bloody stool, near syncope, or syncope. Past Medical History:  Diagnosis Date  . Abscess of tunica vaginalis    10/09- Abundant S. aureus- sensitive to all abx  . Anxiety   . Blood dyscrasia   . CAD (coronary artery disease) 06/15/2006   s/p Subendocardial MI with PDA angioplasty(no stent) on 06/15/06 and relook  cath 06/19/06 showed patency of site. Cath 12/10- no restenosis or significant CAD progression  . CVA (cerebral vascular accident) ([place]) ~ 02/2014   denies residual on 04/22/2014  . CVA (cerebral vascular accident) [facility])    history of remote right cerebellar infarct noted on head CT at least since 10/2011  . Depression   . Diabetes mellitus type 2, uncontrolled, with complications ([place])   . Fibromyalgia   . Gastritis   . Gastroparesis    secondary to poorly controlled DM, last emptying study performed 01/2010  was normal but may be falsely positive as pt was on reglan  . GERD (gastroesophageal reflux disease)   . Hepatitis B, chronic (HCC)    Hep BeAb+,Hep B cAb+ & Hep BsAg+ (9/06)  . History of pyelonephritis    H/o GrpB Pyelonephritis (9/06) and UTI- 07/11- E.Coli,  12/10- GBS  . Hyperlipidemia   . Hypertension   . Iron deficiency anemia   . Irregular menses    Small ovarian follicles seen on IE(3/32)  . MI (myocardial infarction) ([place]) 05/2006   PDA percutaneous transluminal coronary angioplasty  . Migraine    "weekly" (04/22/2014)  . N&V (nausea and vomiting)    Chronic. Unclear etiology with multiple admission and ED visits. CT abdomen with and without contrast (02/2011)  showed no acute process. Gastic Emptying scan (01/2010) was normal. Ultrasound of the abdomen was within normal limits. Hepatitis B viral load was undectable. HIV NR. EGD - gastritis, Hpylori + s/p Rx  . Obesity   . OSA (obstructive sleep apnea)    "suppose to wear mask but I don't" (04/22/2014)  . Peripheral neuropathy   . Pneumonia    "this is probably the 2nd or 3rd time I've had pneumonia" (04/22/2014)  . Recurrent boils   . Seasonal asthma   . Substance abuse ([place])   . Thrombocytosis ([place])    Hem/Onc suggested 2/2 chronic hepatits and/or iron deficiency anemia    Patient Active Problem List   Diagnosis Date Noted  . History of pulmonary embolism 10/10/2017  . Chronic anticoagulation 10/10/2017  . GERD (gastroesophageal reflux disease) 10/10/2017  . Leukocytosis 10/10/2017  . Prolonged QT interval 10/10/2017  . Nausea & vomiting 09/20/2017  . Hypertensive urgency 09/20/2017  . Intractable nausea and vomiting 08/08/2017  . Chronic maxillary sinusitis 01/02/2017  . Acute  blood loss anemia 11/13/2016  . Menorrhagia 11/13/2016  . Bilateral pulmonary embolism ([place]) 10/30/2016  . Acute DVT (deep venous thrombosis) ([place]) 10/30/2016  . Nausea and vomiting 09/23/2016  . Type 2 diabetes mellitus with hyperglycemia ([place]) 04/07/2016  . Diabetic gastroparesis ([place]) 04/06/2016  . Dehydration 03/27/2015  . Gastroparesis 11/11/2014  . Hematemesis 10/13/2014  . DKA (diabetic ketoacidoses) ([place]) 10/13/2014  . Abdominal pain, chronic, left lower quadrant   . Diabetic  gastroparesis associated with type 2 diabetes mellitus ([place]) 04/28/2014  . History of Helicobacter pylori infection 04/22/2014  . Vaginal discharge 02/18/2014  . Unspecified constipation 07/21/2013  . Tinea corporis 07/21/2013  . Intractable vomiting 04/29/2013  . Dysmenorrhea 04/22/2013  . UTI (urinary tract infection) 07/15/2012  . Headache(784.0) 02/08/2012  . Health care maintenance 01/22/2012  . Chronic hepatitis B ([place]) 03/07/2011  . History of leukocytosis 04/06/2010  . THROMBOCYTOSIS 04/06/2010  . Polysubstance abuse ([place]) 02/23/2010  . Iron deficiency anemia 11/22/2009  . PERIPHERAL NEUROPATHY 10/01/2009  . Hyperlipidemia 08/30/2009  . Diabetic polyneuropathy ([place]) 08/30/2009  . Hidradenitis (recurrent boils) 07/07/2008  . Depression 12/27/2007  . Abdominal pain, left lower quadrant 11/21/2007  . FIBROMYALGIA 10/30/2007  . BACK PAIN 04/01/2007  . OBSTRUCTIVE SLEEP APNEA 01/17/2007  . ANXIETY DEPRESSION 06/27/2006  . Chronic ischemic heart disease 06/15/2006  . OBESITY, MORBID 05/15/2006  . MIGRAINE HEADACHE 05/15/2006  . Asthma 05/15/2006  . Essential hypertension 01/16/2006  . IRREGULAR MENSTRUATION 01/16/2006  . PEDAL EDEMA 01/16/2006  . Poorly controlled type 2 diabetes mellitus with peripheral neuropathy ([place]) 01/16/1989    Past Surgical History:  Procedure Laterality Date  . CESAREAN SECTION  1997  . CORONARY ANGIOPLASTY WITH STENT PLACEMENT  2008   "2 stents"  . ESOPHAGOGASTRODUODENOSCOPY N/A 04/23/2014   Procedure: ESOPHAGOGASTRODUODENOSCOPY (EGD);  Surgeon: Winfield Cunas., MD;  Location: [facility] ENDOSCOPY;  Service: Endoscopy;  Laterality: N/A;  . IR ANGIOGRAM PELVIS SELECTIVE OR SUPRASELECTIVE  12/07/2016  . IR ANGIOGRAM PELVIS SELECTIVE OR SUPRASELECTIVE  12/07/2016  . IR ANGIOGRAM SELECTIVE EACH ADDITIONAL VESSEL  12/07/2016  . IR ANGIOGRAM SELECTIVE EACH ADDITIONAL VESSEL  12/07/2016  . IR EMBO ARTERIAL NOT HEMORR HEMANG INC GUIDE ROADMAPPING  12/07/2016   . IR RADIOLOGIST EVAL & MGMT  01/09/2017  . IR US GUIDE VASC ACCESS RIGHT  12/07/2016     OB History    Gravida  2   Para  2   Term  2   Preterm      AB      Living        SAB      TAB      Ectopic      Multiple      Live Births  2            Home Medications    Prior to Admission medications   Medication Sig Start Date End Date Taking? Authorizing Provider  doxycycline (VIBRAMYCIN) 100 MG capsule Take 1 capsule (100 mg total) by mouth 2 (two) times daily. 09/11/18   Juell Radney, [place] Gave, PA-C  glucose blood (FREESTYLE TEST STRIPS) test strip Use as instructed 09/04/17   Colbert Ewing, MD  glucose monitoring kit (FREESTYLE) monitoring kit 1 each by Does not apply route 4 (four) times daily - after meals and at bedtime. 1 month Diabetic Testing Supplies for QAC-QHS accuchecks. 09/04/17   Colbert Ewing, MD  INSULIN SYRINGE .5CC/28G (INS SYRINGE/NEEDLE .5CC/28G) 28G X 1/2" 0.5 ML MISC Please provide 1 month supply Patient not taking: Reported  on 01/07/2018 09/04/17   Colbert Ewing, MD  Lancets (FREESTYLE) lancets Use as instructed 09/04/17   Colbert Ewing, MD    Family History Family History  Problem Relation Age of Onset  . Diabetes Father     Social History Social History   Tobacco Use  . Smoking status: Former Smoker    Types: Cigarettes    Last attempt to quit: 04/24/1996    Years since quitting: 22.4  . Smokeless tobacco: Never Used  . Tobacco comment: quit smoking cigarettes age 57  Substance Use Topics  . Alcohol use: No    Alcohol/week: 0.0 standard drinks    Comment: 04/22/2014 "might have a few drinks a month"  . Drug use: Not Currently    Types: Marijuana, Cocaine    Comment: 04/22/2104 "quit drugs ~ 1-2 yr ago"     Allergies   Lisinopril and Morphine and related   Review of Systems Review of Systems All other systems negative except as documented in the HPI. All pertinent positives and negatives as reviewed in the HPI.  Physical  Exam Updated Vital Signs BP (!) 136/107   Pulse 77   Temp 99 F (37.2 C) (Oral)   Resp 14   Ht 5' 7"  (1.702 m)   Wt 94.8 kg   LMP 09/10/2018 (Exact Date)   SpO2 99%   BMI 32.73 kg/m   Physical Exam Vitals signs and nursing note reviewed.  Constitutional:      General: She is not in acute distress.    Appearance: She is well-developed.  HENT:     Head: Normocephalic and atraumatic.  Eyes:     Pupils: Pupils are equal, round, and reactive to light.  Neck:     Musculoskeletal: Normal range of motion and neck supple.  Cardiovascular:     Rate and Rhythm: Normal rate and regular rhythm.     Heart sounds: Normal heart sounds. No murmur. No friction rub. No gallop.   Pulmonary:     Effort: Pulmonary effort is normal. No respiratory distress.     Breath sounds: Normal breath sounds. No wheezing.  Chest:    Abdominal:     General: Bowel sounds are normal. There is no distension.     Palpations: Abdomen is soft.     Tenderness: There is no abdominal tenderness.  Skin:    General: Skin is warm and dry.     Capillary Refill: Capillary refill takes less than 2 seconds.     Findings: No erythema or rash.  Neurological:     Mental Status: She is alert and oriented to person, place, and time.     Motor: No abnormal muscle tone.     Coordination: Coordination normal.  Psychiatric:        Behavior: Behavior normal.      ED Treatments / Results  Labs (all labs ordered are listed, but only abnormal results are displayed) Labs Reviewed  COMPREHENSIVE METABOLIC PANEL - Abnormal; Notable for the following components:      Result Value   Sodium 132 (*)    Glucose, Bld 479 (*)    Creatinine, Ser 1.14 (*)    Albumin 2.8 (*)    AST 13 (*)    GFR calc non Af Amer 58 (*)    All other components within normal limits  CBC - Abnormal; Notable for the following components:   WBC 13.9 (*)    MCV 77.2 (*)    Platelets 407 (*)    All other  components within normal limits   URINALYSIS, ROUTINE W REFLEX MICROSCOPIC - Abnormal; Notable for the following components:   APPearance HAZY (*)    Glucose, UA >=500 (*)    Hgb urine dipstick LARGE (*)    Protein, ur 30 (*)    RBC / HPF >50 (*)    Bacteria, UA RARE (*)    All other components within normal limits  LIPASE, BLOOD  I-STAT BETA HCG BLOOD, ED ([facility], [facility], [facility] ONLY)    EKG EKG Interpretation  Date/Time:  Wednesday Sep 11 2018 17:52:21 EDT Ventricular Rate:  68 PR Interval:    QRS Duration: 81 QT Interval:  389 QTC Calculation: 414 R Axis:   63 Text Interpretation:  Sinus rhythm Since last tracing rate slower and QT now normal Confirmed by Daleen Bo [phone]) on 09/12/2018 8:24:38 PM   Radiology No results found.  Procedures Procedures (including critical care time)  Medications Ordered in ED Medications  sodium chloride flush (NS) 0.9 % injection 3 mL (3 mLs Intravenous Given 09/11/18 1810)  ondansetron (ZOFRAN-ODT) disintegrating tablet 4 mg (4 mg Oral Given 09/11/18 1334)  ibuprofen (ADVIL) tablet 400 mg (400 mg Oral Given 09/11/18 1514)  sodium chloride 0.9 % bolus 2,000 mL (0 mLs Intravenous Stopped 09/11/18 2107)  metoCLOPramide (REGLAN) injection 10 mg (10 mg Intravenous Given 09/11/18 1810)     Initial Impression / Assessment and Plan / ED Course  I have reviewed the triage vital signs and the nursing notes.  Pertinent labs & imaging results that were available during my care of the patient were reviewed by me and considered in my medical decision making (see chart for details).       I did put in a referral to the breast center for the patient have breast ultrasound and needle guided aspiration as needed.  The patient is advised the plan and all questions were answered.  I did prescribe antibiotics as well.  The patient was able to tolerate fluids and had no vomiting here in the emergency department.  Her vital signs did improve while here in the emergency department. Final Clinical  Impressions(s) / ED Diagnoses   Final diagnoses:  Gastroparesis  Breast abscess    ED Discharge Orders         Ordered    doxycycline (VIBRAMYCIN) 100 MG capsule  2 times daily     09/11/18 2122    US BREAST ASPIRATION RIGHT     09/11/18 2137    US BREAST COMPLETE UNI RIGHT INC AXILLA     09/11/18 2137           Dalia Heading, PA-C 09/12/18 2353    Charlesetta Shanks, MD 09/18/18 [phone]

## 2018-09-19 MED FILL — DOXYCYCLINE HYCLATE 100 MG: 100 | 10 days supply | Qty: 20 | Fill #0

## 2018-11-04 ENCOUNTER — Ambulatory Visit (INDEPENDENT_AMBULATORY_CARE_PROVIDER_SITE_OTHER): Payer: Medicaid Other | Admitting: Primary Care

## 2018-11-11 IMAGING — CR DG ABDOMEN ACUTE W/ 1V CHEST
4 series · 4 of 4 positions shown · non-contrast
Comparison: Chest radiographs 02/05/2016. CT abdomen and pelvis
11/27/2015.

CLINICAL DATA: Nausea and vomiting. Left lower quadrant abdominal
pain.

EXAM:
DG ABDOMEN ACUTE W/ 1V CHEST

[chest pa]
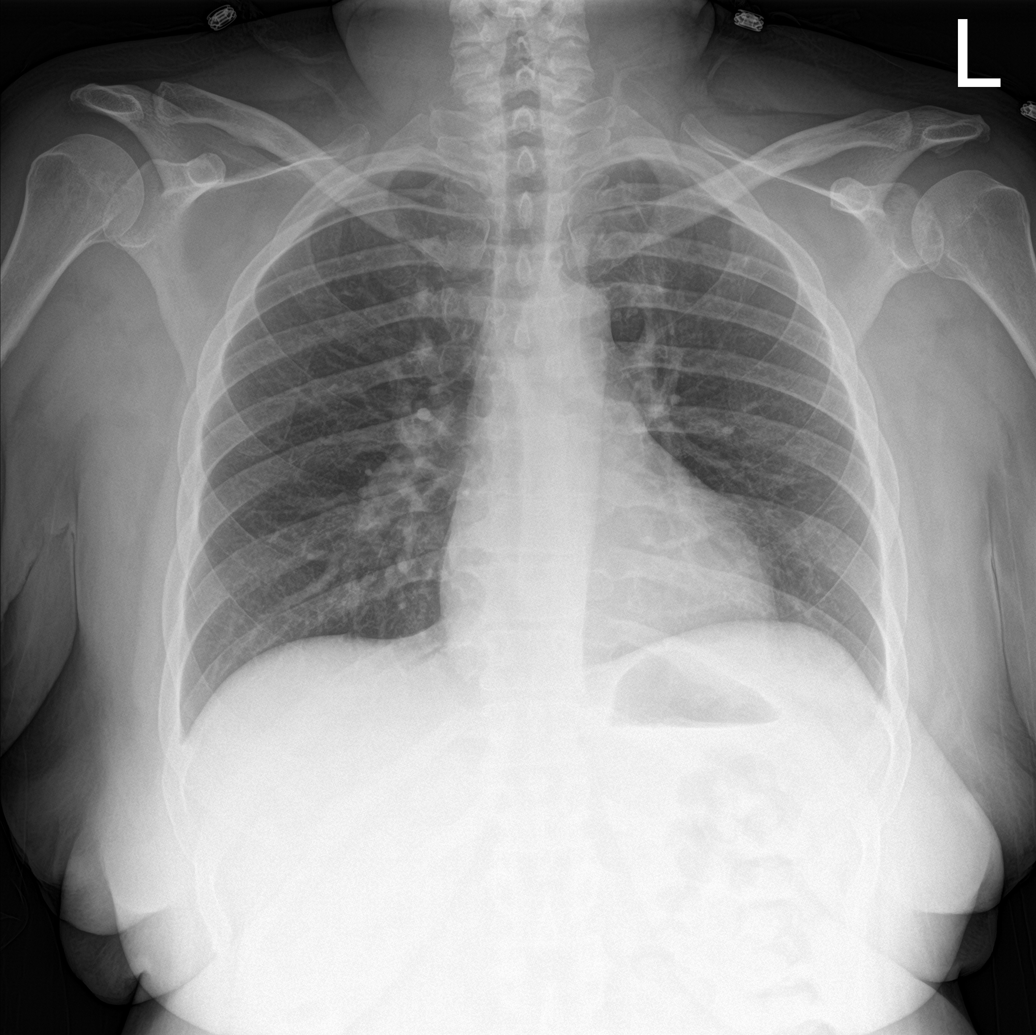

[abdomen erect]
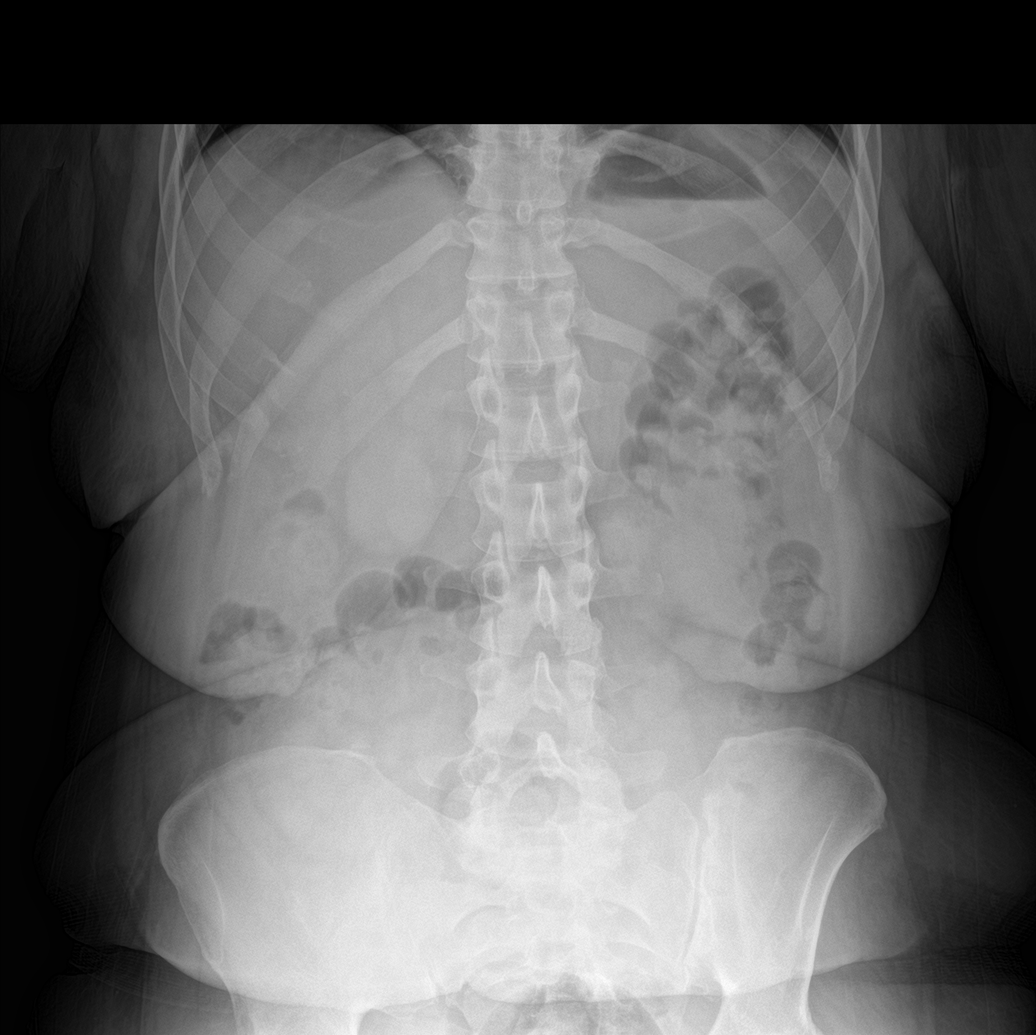

[abdomen supine (1 of 2)]
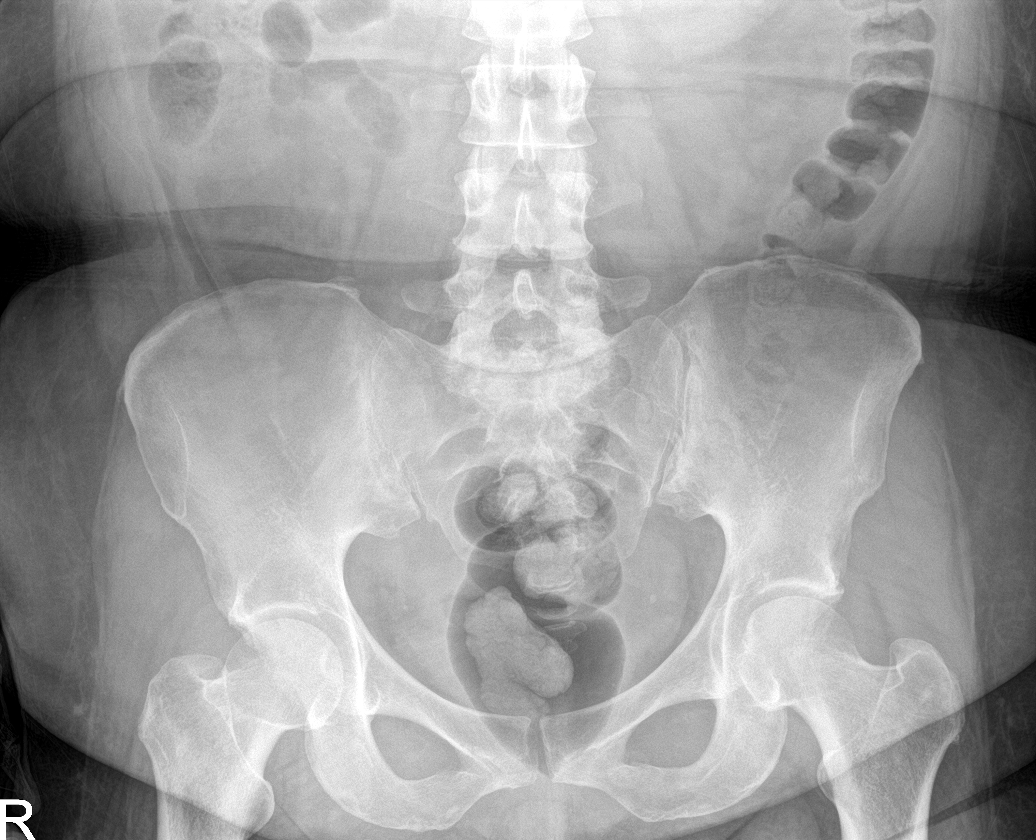

[abdomen supine (2 of 2)]
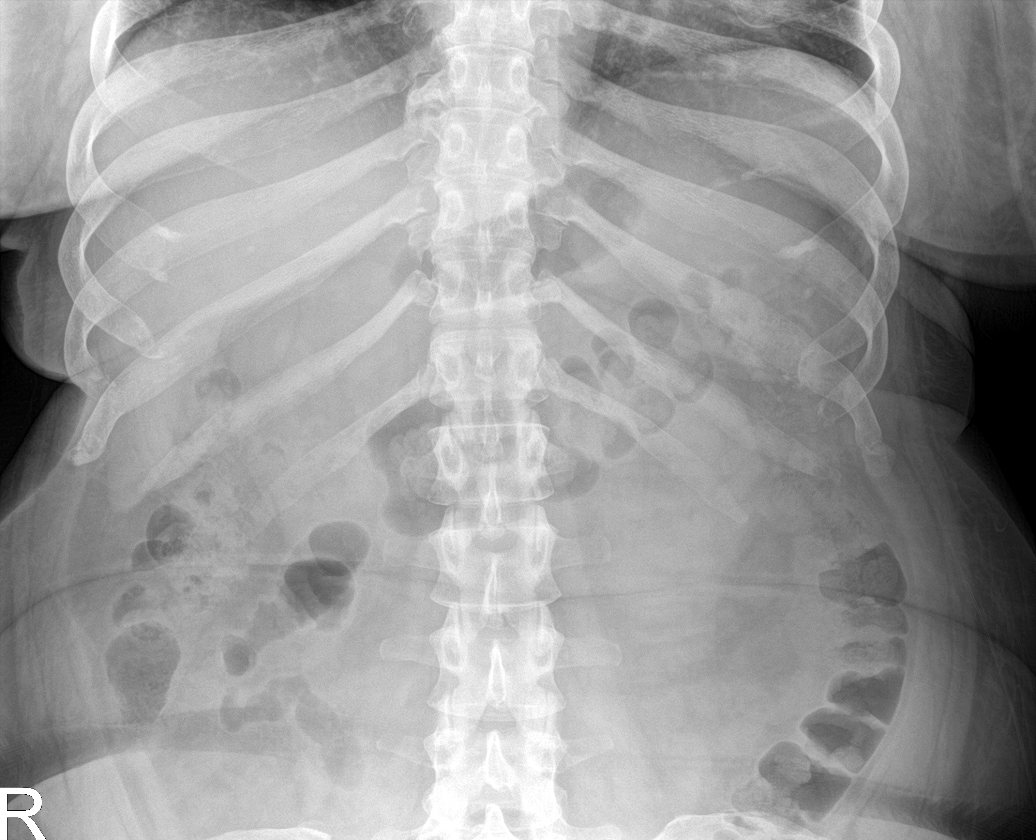

[4 of 4 positions shown; findings below may reference images not displayed]

FINDINGS: The cardiomediastinal silhouette is within normal limits. The lungs
are clear. No pleural effusion or pneumothorax is identified.

There is no evidence of intraperitoneal free air. Gas and a typical
amount of stool are present in the colon. No dilated loops of bowel
are seen to suggest obstruction. No acute osseous abnormality is
identified.
IMPRESSION: Negative abdominal radiographs.  No acute cardiopulmonary disease.

## 2018-11-28 ENCOUNTER — Encounter (INDEPENDENT_AMBULATORY_CARE_PROVIDER_SITE_OTHER): Payer: Self-pay | Admitting: Primary Care

## 2018-11-28 ENCOUNTER — Ambulatory Visit (INDEPENDENT_AMBULATORY_CARE_PROVIDER_SITE_OTHER): Payer: Medicare Other | Admitting: Primary Care

## 2018-11-28 ENCOUNTER — Other Ambulatory Visit: Payer: Self-pay | Admitting: Pharmacist

## 2018-11-28 ENCOUNTER — Other Ambulatory Visit: Payer: Self-pay

## 2018-11-28 VITALS — BP 147/96 | HR 107 | Temp 97.8°F | Ht 67.0 in | Wt 224.6 lb

## 2018-11-28 DIAGNOSIS — K3184 Gastroparesis: Secondary | ICD-10-CM

## 2018-11-28 DIAGNOSIS — I10 Essential (primary) hypertension: Secondary | ICD-10-CM | POA: Diagnosis not present

## 2018-11-28 DIAGNOSIS — J0100 Acute maxillary sinusitis, unspecified: Secondary | ICD-10-CM | POA: Diagnosis not present

## 2018-11-28 DIAGNOSIS — E1169 Type 2 diabetes mellitus with other specified complication: Secondary | ICD-10-CM

## 2018-11-28 DIAGNOSIS — IMO0002 Reserved for concepts with insufficient information to code with codable children: Secondary | ICD-10-CM

## 2018-11-28 DIAGNOSIS — Z794 Long term (current) use of insulin: Secondary | ICD-10-CM

## 2018-11-28 DIAGNOSIS — E1165 Type 2 diabetes mellitus with hyperglycemia: Secondary | ICD-10-CM

## 2018-11-28 LAB — POCT CBG (FASTING - GLUCOSE)-MANUAL ENTRY: Glucose Fasting, POC: 356 mg/dL — AB (ref 70–99)

## 2018-11-28 LAB — POCT GLYCOSYLATED HEMOGLOBIN (HGB A1C): Hemoglobin A1C: 13.3 % — AB (ref 4.0–5.6)

## 2018-11-28 MED ORDER — ONETOUCH VERIO VI STRP: ORAL_STRIP | 11 refills | Status: DC

## 2018-11-28 MED ORDER — LANTUS SOLOSTAR 100 UNIT/ML ~~LOC~~ SOPN: 20.0000 [IU] | PEN_INJECTOR | Freq: Every day | SUBCUTANEOUS | 2 refills | Status: DC

## 2018-11-28 MED ORDER — BLOOD GLUCOSE METER KIT: PACK | 0 refills | Status: DC

## 2018-11-28 MED ORDER — ONETOUCH DELICA LANCETS 33G MISC: 11 refills | Status: DC

## 2018-11-28 MED ORDER — ALBUTEROL SULFATE HFA 108 (90 BASE) MCG/ACT IN AERS: 2.0000 | INHALATION_SPRAY | Freq: Four times a day (QID) | RESPIRATORY_TRACT | 1 refills | Status: DC | PRN

## 2018-11-28 MED ORDER — LEVOCETIRIZINE DIHYDROCHLORIDE 5 MG PO TABS: 5.0000 mg | ORAL_TABLET | Freq: Every evening | ORAL | 11 refills | Status: DC

## 2018-11-28 MED ORDER — INSULIN GLARGINE 100 UNITS/ML SOLOSTAR PEN: 20.0000 [IU] | PEN_INJECTOR | Freq: Every day | SUBCUTANEOUS | 3 refills | Status: DC

## 2018-11-28 MED ORDER — TRAZODONE HCL 50 MG PO TABS: 25.0000 mg | ORAL_TABLET | Freq: Every evening | ORAL | 3 refills | Status: DC | PRN

## 2018-11-28 MED ORDER — ONETOUCH VERIO W/DEVICE KIT: PACK | 0 refills | Status: DC

## 2018-11-28 MED ORDER — "PEN NEEDLES 3/16"" 31G X 5 MM MISC": 1.0000 | Freq: Three times a day (TID) | 5 refills | Status: AC

## 2018-11-28 MED ORDER — LOSARTAN POTASSIUM-HCTZ 100-25 MG PO TABS: 1.0000 | ORAL_TABLET | Freq: Every day | ORAL | 3 refills | Status: DC

## 2018-11-28 MED ORDER — SITAGLIPTIN PHOSPHATE 100 MG PO TABS: 100.0000 mg | ORAL_TABLET | Freq: Every day | ORAL | 3 refills | Status: DC

## 2018-11-28 MED FILL — !JANUVIA 100MG TABLET: 100 | 30 days supply | Qty: 30 | Fill #0

## 2018-11-28 MED FILL — LOSARTAN-HCTZ 100-25 MG TAB: 100-25 | 30 days supply | Qty: 30 | Fill #0

## 2018-11-28 MED FILL — traZODone HCL 50 MG TABS: 50 | 30 days supply | Qty: 30 | Fill #0

## 2018-11-28 MED FILL — !VENTOLIN HFA INHALER: 108 (90 BAS | 18 days supply | Qty: 18 | Fill #0

## 2018-11-28 MED FILL — LEVOCETIRIZINE 5 MG TABLET: 5 | 30 days supply | Qty: 30 | Fill #0

## 2018-11-28 MED FILL — !LANTUS SOLOSTAR 100UNITS/M: 100 | 30 days supply | Qty: 6 | Fill #0

## 2018-11-28 NOTE — Progress Notes (Signed)
Established Patient Office Visit  Subjective:  Patient ID: Shirley [name], female    DOB: [birth year]  Age: 43 y.o. MRN: [id number]  CC:  Chief Complaint  Patient presents with  . Establish Care    HTN   . personal care services    HPI Shirley [name] presents to establish care she has not seen a PCP in several years but had gynecological issues and was being followed by OBGYN.  Past Medical History:  Diagnosis Date  . Abscess of tunica vaginalis    10/09- Abundant S. aureus- sensitive to all abx  . Anxiety   . Blood dyscrasia   . CAD (coronary artery disease) 06/15/2006   s/p Subendocardial MI with PDA angioplasty(no stent) on 06/15/06 and relook  cath 06/19/06 showed patency of site. Cath 12/10- no restenosis or significant CAD progression  . CVA (cerebral vascular accident) ([place]) ~ 02/2014   denies residual on 04/22/2014  . CVA (cerebral vascular accident) [facility])    history of remote right cerebellar infarct noted on head CT at least since 10/2011  . Depression   . Diabetes mellitus type 2, uncontrolled, with complications ([place])   . Fibromyalgia   . Gastritis   . Gastroparesis    secondary to poorly controlled DM, last emptying study performed 01/2010  was normal but may be falsely positive as pt was on reglan  . GERD (gastroesophageal reflux disease)   . Hepatitis B, chronic (HCC)    Hep BeAb+,Hep B cAb+ & Hep BsAg+ (9/06)  . History of pyelonephritis    H/o GrpB Pyelonephritis (9/06) and UTI- 07/11- E.Coli, 12/10- GBS  . Hyperlipidemia   . Hypertension   . Iron deficiency anemia   . Irregular menses    Small ovarian follicles seen on OE(7/03)  . MI (myocardial infarction) ([place]) 05/2006   PDA percutaneous transluminal coronary angioplasty  . Migraine    "weekly" (04/22/2014)  . N&V (nausea and vomiting)    Chronic. Unclear etiology with multiple admission and ED visits. CT abdomen with and without contrast (02/2011)  showed no acute process. Gastic Emptying scan  (01/2010) was normal. Ultrasound of the abdomen was within normal limits. Hepatitis B viral load was undectable. HIV NR. EGD - gastritis, Hpylori + s/p Rx  . Obesity   . OSA (obstructive sleep apnea)    "suppose to wear mask but I don't" (04/22/2014)  . Peripheral neuropathy   . Pneumonia    "this is probably the 2nd or 3rd time I've had pneumonia" (04/22/2014)  . Recurrent boils   . Seasonal asthma   . Substance abuse ([place])   . Thrombocytosis ([place])    Hem/Onc suggested 2/2 chronic hepatits and/or iron deficiency anemia    Past Surgical History:  Procedure Laterality Date  . CESAREAN SECTION  1997  . CORONARY ANGIOPLASTY WITH STENT PLACEMENT  2008   "2 stents"  . ESOPHAGOGASTRODUODENOSCOPY N/A 04/23/2014   Procedure: ESOPHAGOGASTRODUODENOSCOPY (EGD);  Surgeon: Winfield Cunas., MD;  Location: [facility] ENDOSCOPY;  Service: Endoscopy;  Laterality: N/A;  . IR ANGIOGRAM PELVIS SELECTIVE OR SUPRASELECTIVE  12/07/2016  . IR ANGIOGRAM PELVIS SELECTIVE OR SUPRASELECTIVE  12/07/2016  . IR ANGIOGRAM SELECTIVE EACH ADDITIONAL VESSEL  12/07/2016  . IR ANGIOGRAM SELECTIVE EACH ADDITIONAL VESSEL  12/07/2016  . IR EMBO ARTERIAL NOT HEMORR HEMANG INC GUIDE ROADMAPPING  12/07/2016  . IR RADIOLOGIST EVAL & MGMT  01/09/2017  . IR US GUIDE VASC ACCESS RIGHT  12/07/2016    Family History  Problem Relation  Age of Onset  . Diabetes Father     Social History   Socioeconomic History  . Marital status: Single    Spouse name: Not on file  . Number of children: 2  . Years of education: 62  . Highest education level: Not on file  Occupational History  . Occupation: other    Comment: unemployed  Social Needs  . Financial resource strain: Not on file  . Food insecurity    Worry: Not on file    Inability: Not on file  . Transportation needs    Medical: Not on file    Non-medical: Not on file  Tobacco Use  . Smoking status: Former Smoker    Types: Cigarettes    Quit date: 04/24/1996    Years since  quitting: 22.6  . Smokeless tobacco: Never Used  . Tobacco comment: quit smoking cigarettes age 47  Substance and Sexual Activity  . Alcohol use: No    Alcohol/week: 0.0 standard drinks    Comment: 04/22/2014 "might have a few drinks a month"  . Drug use: Not Currently    Types: Marijuana, Cocaine    Comment: 04/22/2104 "quit drugs ~ 1-2 yr ago"  . Sexual activity: Yes    Birth control/protection: None  Lifestyle  . Physical activity    Days per week: Not on file    Minutes per session: Not on file  . Stress: Not on file  Relationships  . Social Herbalist on phone: Not on file    Gets together: Not on file    Attends religious service: Not on file    Active member of club or organization: Not on file    Attends meetings of clubs or organizations: Not on file    Relationship status: Not on file  . Intimate partner violence    Fear of current or ex partner: Not on file    Emotionally abused: Not on file    Physically abused: Not on file    Forced sexual activity: Not on file  Other Topics Concern  . 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   Also works at [employer] having to lift elderly individuals.          Outpatient Medications Prior to Visit  Medication Sig Dispense Refill  . HYDROcodone-acetaminophen (NORCO) 10-325 MG tablet Take 1 tablet by mouth every 6 (six) hours as needed. for pain    . penicillin v potassium (VEETID) 500 MG tablet Take 500 mg by mouth every 6 (six) hours.    . INSULIN SYRINGE .5CC/28G (INS SYRINGE/NEEDLE .5CC/28G) 28G X 1/2" 0.5 ML MISC Please provide 1 month supply (Patient not taking: Reported on 01/07/2018) 100 each 0  . doxycycline (VIBRAMYCIN) 100 MG capsule Take 1 capsule (100 mg total) by mouth 2 (two) times daily. 20 capsule 0  . glucose blood (FREESTYLE TEST STRIPS) test strip Use as instructed (Patient not taking: Reported on 11/28/2018)  100 each 0  . glucose monitoring kit (FREESTYLE) monitoring kit 1 each by Does not apply route 4 (four) times daily - after meals and at bedtime. 1 month Diabetic Testing Supplies for QAC-QHS accuchecks. (Patient not taking: Reported on 11/28/2018) 1 each 0  . Lancets (FREESTYLE) lancets Use as instructed (Patient not taking: Reported on 11/28/2018) 100 each 0   No facility-administered medications prior to visit.     Allergies  Allergen Reactions  . Lisinopril Nausea Only and Swelling  . Morphine And Related Itching and Swelling    ROS Review of Systems  Constitutional: Positive for activity change and fatigue.  HENT: Positive for congestion.   Eyes: Positive for itching.  Respiratory: Positive for cough and shortness of breath.   Musculoskeletal: Positive for arthralgias.  Allergic/Immunologic: Positive for environmental allergies.  Neurological: Positive for headaches.  Psychiatric/Behavioral: Positive for sleep disturbance.      Objective:    Physical Exam  Constitutional: She is oriented to person, place, and time. She appears well-developed and well-nourished.  HENT:  Head: Normocephalic.  Eyes: Pupils are equal, round, and reactive to light. EOM are normal.  Neck: Neck supple.  Cardiovascular: Normal rate and regular rhythm.  Pulmonary/Chest: Breath sounds normal.  Abdominal: Soft. Bowel sounds are normal. She exhibits distension.  Musculoskeletal:     Comments: Pain all over  Neurological: She is oriented to person, place, and time.  Skin: Skin is warm.  Psychiatric: She has a normal mood and affect.    BP (!) 147/96 (BP Location: Left Arm, Patient Position: Sitting, Cuff Size: Normal)   Pulse (!) 107   Temp 97.8 F (36.6 C) (Tympanic)   Ht 5' 7"  (1.702 m)   Wt 224 lb 9.6 oz (101.9 kg)   LMP 11/02/2018 (Exact Date)   SpO2 93%   BMI 35.18 kg/m  Wt Readings from Last 3 Encounters:  11/28/18 224 lb 9.6 oz (101.9 kg)  09/11/18 209 lb (94.8 kg)  01/07/18 216 lb  1.6 oz (98 kg)     Health Maintenance Due  Topic Date Due  . PNEUMOCOCCAL POLYSACCHARIDE VACCINE AGE 1-64 HIGH RISK  10/05/1975  . LIPID PANEL  07/22/2014  . FOOT EXAM  12/22/2015  . OPHTHALMOLOGY EXAM  01/30/2017  . INFLUENZA VACCINE  11/23/2018    There are no preventive care reminders to display for this patient.  Lab Results  Component Value Date   TSH 0.651 04/29/2013   Lab Results  Component Value Date   WBC 13.9 (H) 09/11/2018   HGB 12.4 09/11/2018   HCT 36.2 09/11/2018   MCV 77.2 (L) 09/11/2018   PLT 407 (H) 09/11/2018   Lab Results  Component Value Date   NA 132 (L) 09/11/2018   K 4.3 09/11/2018   CO2 22 09/11/2018   GLUCOSE 479 (H) 09/11/2018   BUN 14 09/11/2018   CREATININE 1.14 (H) 09/11/2018   BILITOT 0.4 09/11/2018   ALKPHOS 65 09/11/2018   AST 13 (L) 09/11/2018   ALT 9 09/11/2018   PROT 7.3 09/11/2018   ALBUMIN 2.8 (L) 09/11/2018   CALCIUM 8.9 09/11/2018   ANIONGAP 10 09/11/2018   Lab Results  Component Value Date   CHOL 169 07/21/2013   Lab Results  Component Value Date   HDL 41 07/21/2013   Lab Results  Component Value Date   LDLCALC 108 (H) 07/21/2013   Lab Results  Component Value Date   TRIG 100 07/21/2013   Lab Results  Component Value Date   CHOLHDL 4.1 07/21/2013   Lab Results  Component Value Date   HGBA1C 13.3 (A) 11/28/2018      Assessment & Plan:   Shirley [name] was seen today for establish care and personal care services.  Diagnoses and all orders for this visit:  Type 2 diabetes mellitus with other specified complication, with long-term current use of insulin ([place]) ADA recommends the following therapeutic goals for glycemic control related to A1c measurements: Goal of  therapy: Less than 6.5 hemoglobin A1c.  Reference clinical practice recommendations. Foods that are high in carbohydrates are the following rice, potatoes, breads, sugars, and pastas.  Reduction in the intake (eating) will assist in lowering your blood  sugars. -     HgB A1c -     Glucose (CBG), Fasting  Gastroparesis Discussed eating small frequent meal, reduction in acidic foods, fried foods ,spicy foods, alcohol caffeine and tobacco and certain medications. Avoid laying down after eating 33mns-1hour, elevated head of the bed.   Hypertension, unspecified type Counseled on blood pressure goal of less than 130/80, low-sodium, DASH diet, medication compliance, 150 minutes of moderate intensity exercise per week. Discussed medication compliance, adverse effects.  Acute non-recurrent maxillary sinusitis. Infections are common during spring and fall due to  increased pollination.  Antihistamines can be used daily with intrnasal steroid spray limited to 14 days to avoid rebound effects. May use normal saline nasal spray daily.  Other orders -     Discontinue: blood glucose meter kit and supplies; Dispense based on patient and insurance preference. Use up to four times daily as directed. (FOR ICD-10 E10.9, E11.9). -     Discontinue: insulin glargine (LANTUS) 100 unit/mL SOPN; Inject 0.2 mLs (20 Units total) into the skin daily. -     sitaGLIPtin (JANUVIA) 100 MG tablet; Take 1 tablet (100 mg total) by mouth daily. -     levocetirizine (XYZAL) 5 MG tablet; Take 1 tablet (5 mg total) by mouth every evening. -     Insulin Pen Needle (PEN NEEDLES 3/16") 31G X 5 MM MISC; 1 Device by Does not apply route 4 (four) times daily - after meals and at bedtime. -     losartan-hydrochlorothiazide (HYZAAR) 100-25 MG tablet; Take 1 tablet by mouth daily. -     albuterol (VENTOLIN HFA) 108 (90 Base) MCG/ACT inhaler; Inhale 2 puffs into the lungs every 6 (six) hours as needed for wheezing or shortness of breath. -     traZODone (DESYREL) 50 MG tablet; Take 0.5-1 tablets (25-50 mg total) by mouth at bedtime as needed for sleep.    Meds ordered this encounter  Medications  . DISCONTD: blood glucose meter kit and supplies    Sig: Dispense based on patient and  insurance preference. Use up to four times daily as directed. (FOR ICD-10 E10.9, E11.9).    Dispense:  1 each    Refill:  0    Order Specific Question:   Number of strips    Answer:   100    Order Specific Question:   Number of lancets    Answer:   100  . DISCONTD: insulin glargine (LANTUS) 100 unit/mL SOPN    Sig: Inject 0.2 mLs (20 Units total) into the skin daily.    Dispense:  3 mL    Refill:  3  . sitaGLIPtin (JANUVIA) 100 MG tablet    Sig: Take 1 tablet (100 mg total) by mouth daily.    Dispense:  330 tablet    Refill:  3  . levocetirizine (XYZAL) 5 MG tablet    Sig: Take 1 tablet (5 mg total) by mouth every evening.    Dispense:  30 tablet    Refill:  11  . Insulin Pen Needle (PEN NEEDLES 3/16") 31G X 5 MM MISC    Sig: 1 Device by Does not apply route 4 (four) times daily - after meals and at bedtime.    Dispense:  100 each    Refill:  5  . losartan-hydrochlorothiazide (HYZAAR) 100-25 MG tablet    Sig: Take 1 tablet by mouth daily.    Dispense:  90 tablet    Refill:  3  . albuterol (VENTOLIN HFA) 108 (90 Base) MCG/ACT inhaler    Sig: Inhale 2 puffs into the lungs every 6 (six) hours as needed for wheezing or shortness of breath.    Dispense:  6.7 g    Refill:  1  . traZODone (DESYREL) 50 MG tablet    Sig: Take 0.5-1 tablets (25-50 mg total) by mouth at bedtime as needed for sleep.    Dispense:  30 tablet    Refill:  3    Follow-up: Return in about 2 weeks (around 12/12/2018) for BP check and bring meter.    Kerin Perna, NP

## 2018-11-28 NOTE — Patient Instructions (Signed)

## 2018-12-03 ENCOUNTER — Telehealth (INDEPENDENT_AMBULATORY_CARE_PROVIDER_SITE_OTHER): Payer: Self-pay

## 2018-12-03 NOTE — Telephone Encounter (Signed)
[facility] pharmacy on [address] [phone] called to request RX for OneTouch Delica Lancets 58N MISC  Insulin Pen Needle (PEN NEEDLES 3/16") 31G X 5 MM  glucose blood (ONETOUCH VERIO) test strip  Blood Glucose Monitoring Suppl (ONETOUCH VERIO) w/Device KIT    States that patient wants RX to be filled with them. Pharmacy is claiming medicare part b and needs new rx in order to claim the insurance.  Please advice [phone]  Thank you Whitney Post

## 2018-12-04 ENCOUNTER — Other Ambulatory Visit: Payer: Self-pay | Admitting: Pharmacist

## 2018-12-04 ENCOUNTER — Telehealth (INDEPENDENT_AMBULATORY_CARE_PROVIDER_SITE_OTHER): Payer: Self-pay

## 2018-12-04 DIAGNOSIS — IMO0002 Reserved for concepts with insufficient information to code with codable children: Secondary | ICD-10-CM

## 2018-12-04 DIAGNOSIS — E1165 Type 2 diabetes mellitus with hyperglycemia: Secondary | ICD-10-CM

## 2018-12-04 DIAGNOSIS — E1169 Type 2 diabetes mellitus with other specified complication: Secondary | ICD-10-CM

## 2018-12-04 MED ORDER — ONETOUCH VERIO VI STRP: ORAL_STRIP | 11 refills | Status: AC

## 2018-12-04 MED ORDER — ONETOUCH VERIO REFLECT W/DEVICE KIT: 1.0000 | PACK | Freq: Three times a day (TID) | 0 refills | Status: DC

## 2018-12-04 MED ORDER — ONETOUCH VERIO W/DEVICE KIT: PACK | 0 refills | Status: DC

## 2018-12-04 MED ORDER — ONETOUCH DELICA LANCETS 33G MISC: 11 refills | Status: AC

## 2018-12-04 NOTE — Telephone Encounter (Signed)
Done. Nat Christen, CMA

## 2018-12-04 NOTE — Telephone Encounter (Signed)
[facility] pharmacy called to inform that onetouch verio is not available. Wanted a new Rx for onetouch verioreflect. New rx sent to pharmacy. Nat Christen, CMA

## 2018-12-11 ENCOUNTER — Ambulatory Visit (INDEPENDENT_AMBULATORY_CARE_PROVIDER_SITE_OTHER): Payer: Medicare Other | Admitting: Primary Care

## 2018-12-27 ENCOUNTER — Other Ambulatory Visit: Payer: Self-pay | Admitting: Physician Assistant

## 2018-12-27 DIAGNOSIS — Z1231 Encounter for screening mammogram for malignant neoplasm of breast: Secondary | ICD-10-CM

## 2019-01-02 IMAGING — DX DG HIP (WITH OR WITHOUT PELVIS) 2-3V*R*
3 series · 3 of 3 positions shown · non-contrast
Comparison: None.

CLINICAL DATA: Right lateral hip pain secondary to a fall
yesterday.

EXAM:
DG HIP (WITH OR WITHOUT PELVIS) 2-3V RIGHT

[pelvis ap]
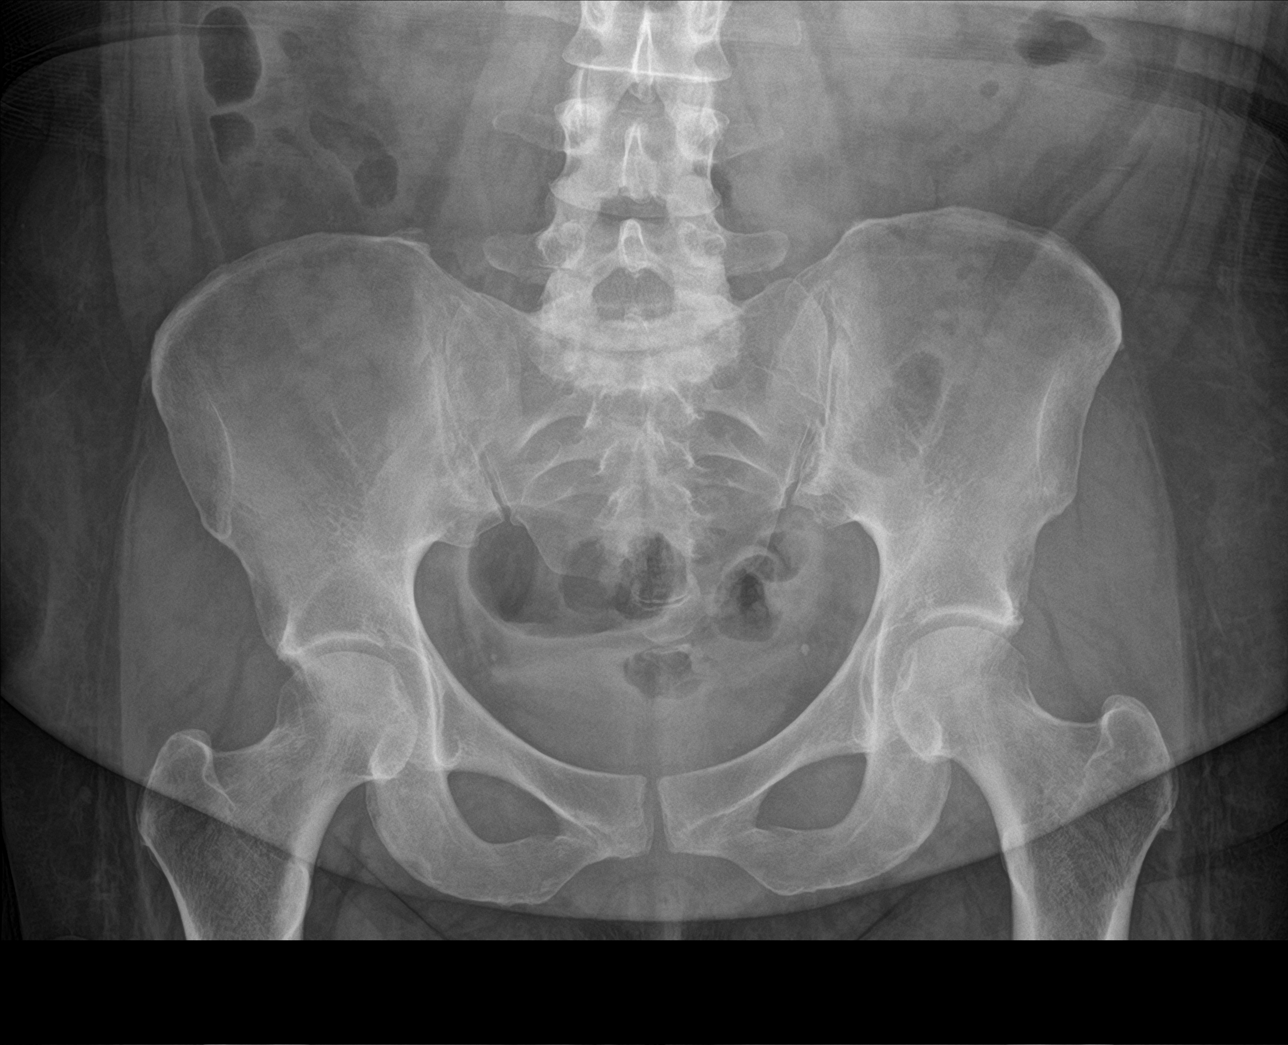

[hip ap]
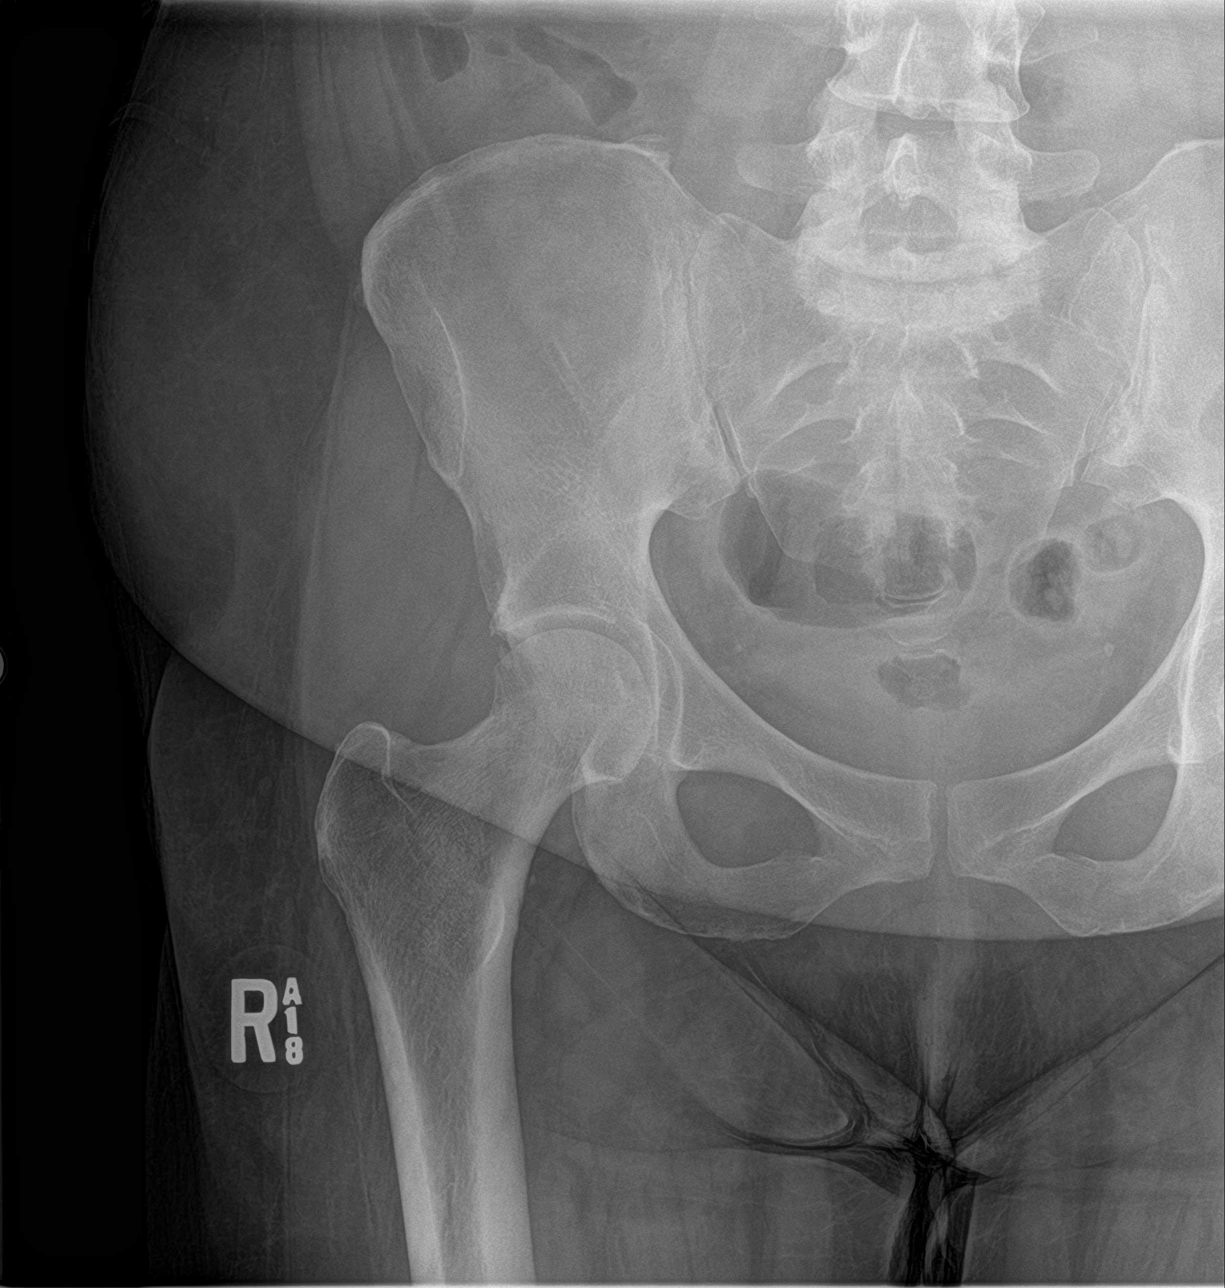

[hip lat]
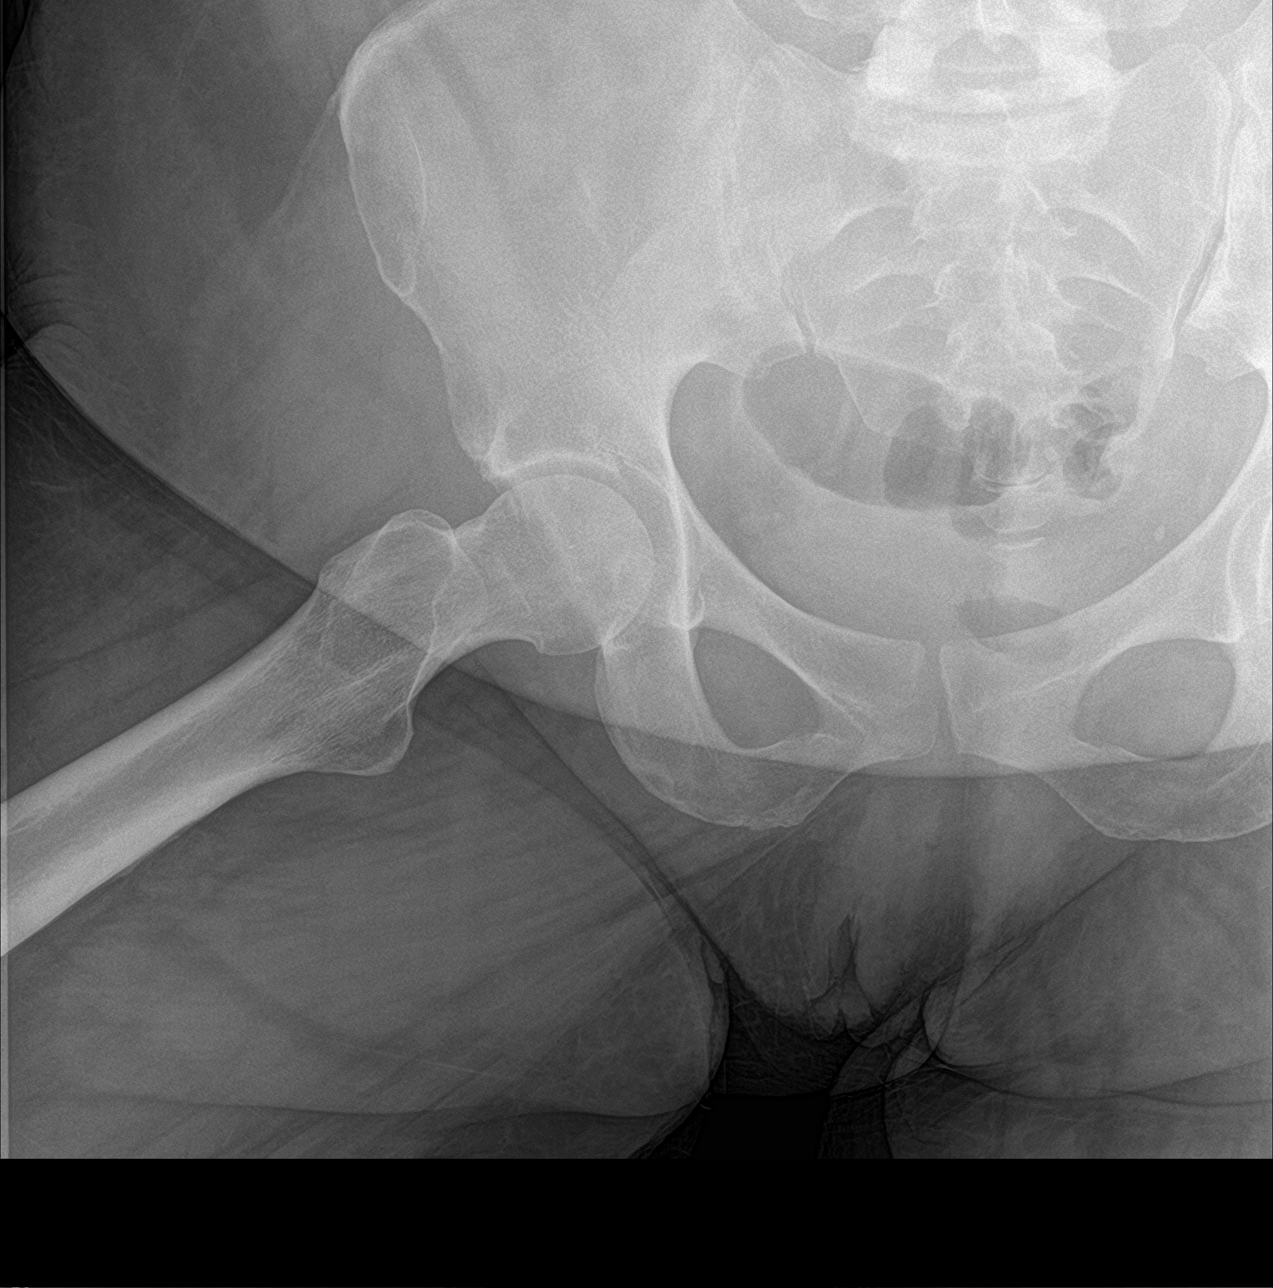

[3 of 3 positions shown; findings below may reference images not displayed]

FINDINGS: There is no evidence of hip fracture or dislocation. There is no
evidence of arthropathy or other focal bone abnormality.
IMPRESSION: Negative.

## 2019-01-02 IMAGING — DX DG CHEST 2V
2 series · 2 of 2 positions shown · non-contrast
Comparison: 04/07/2016

CLINICAL DATA: Shortness of breath, chest pain, diaphoresis

EXAM:
CHEST  2 VIEW

[chest pa]
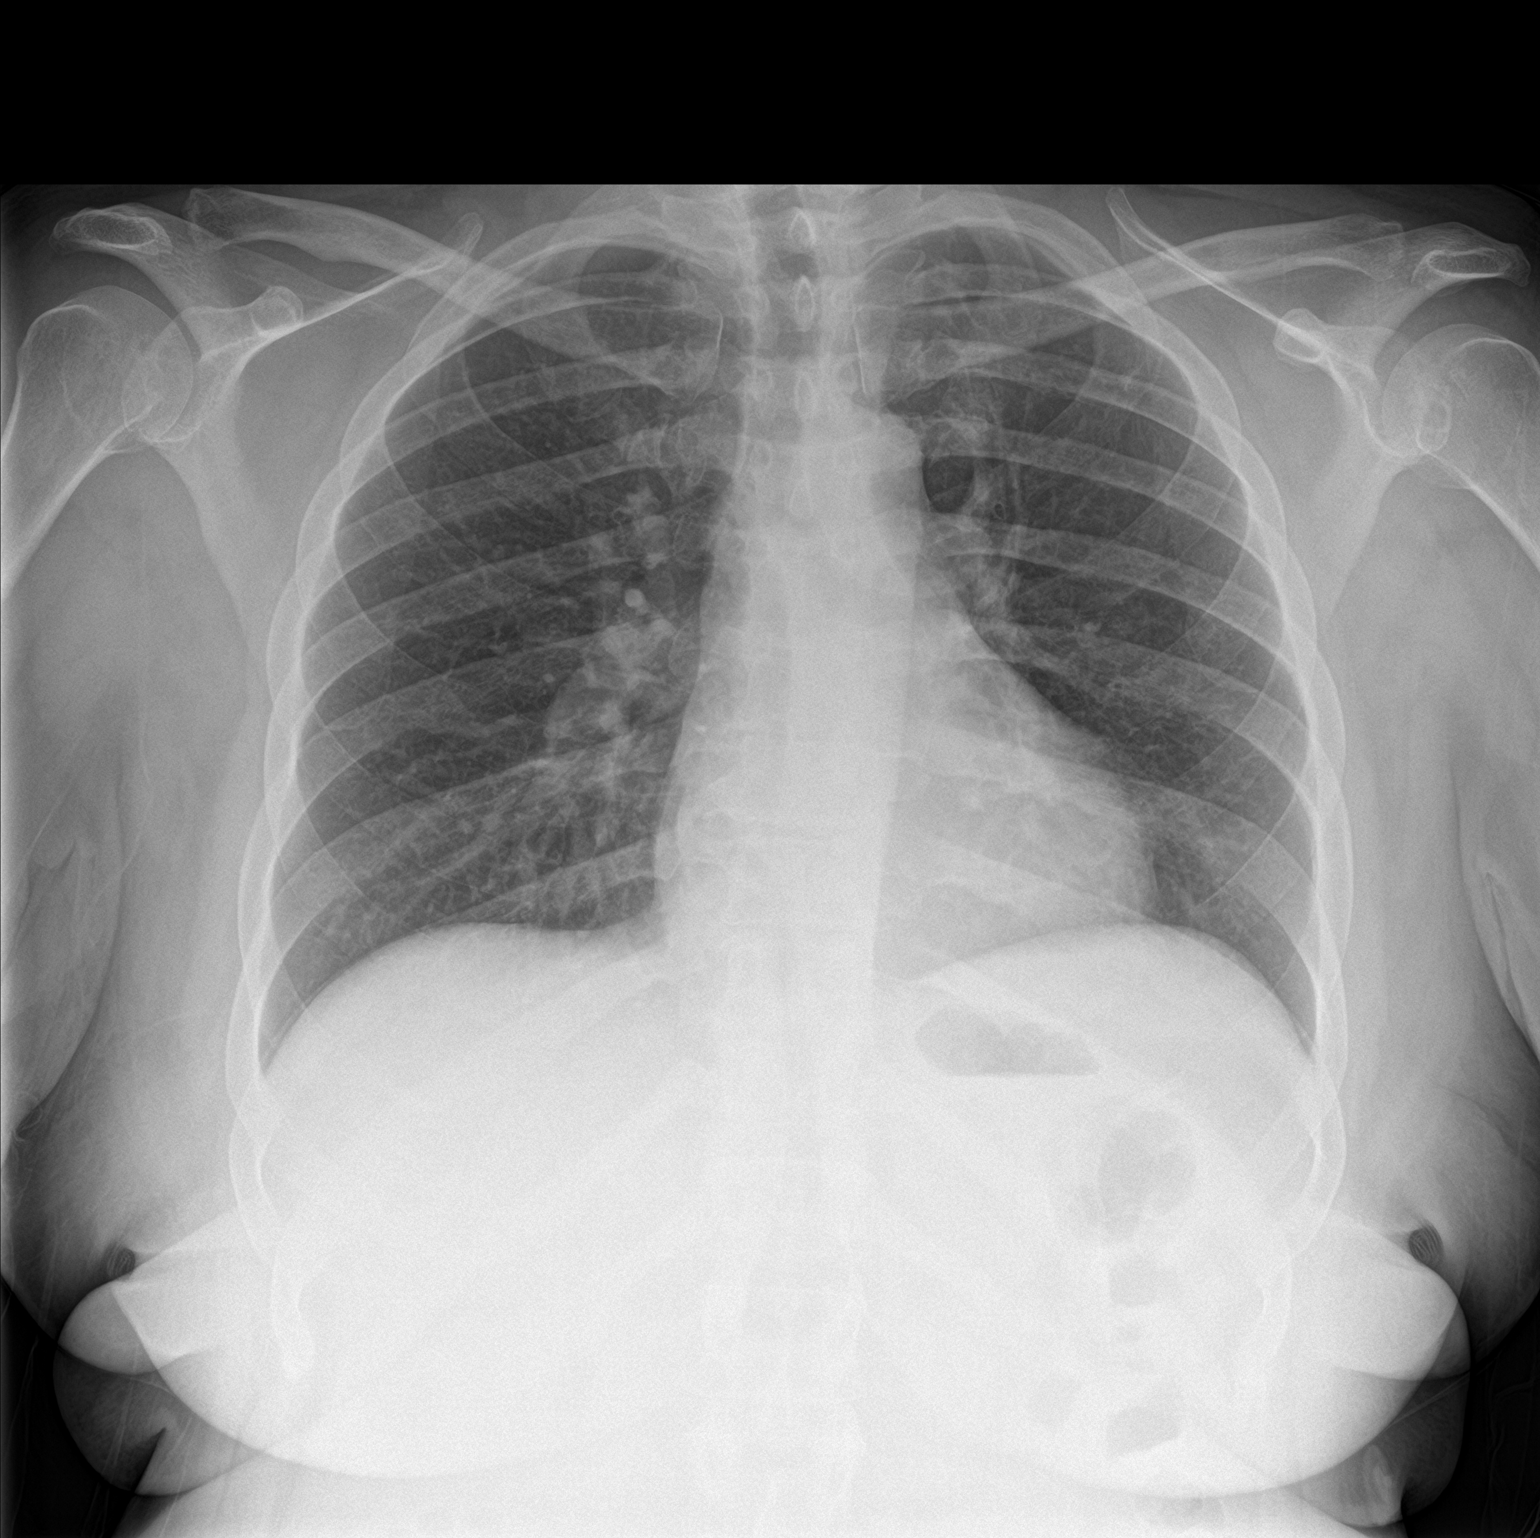

[chest lat]
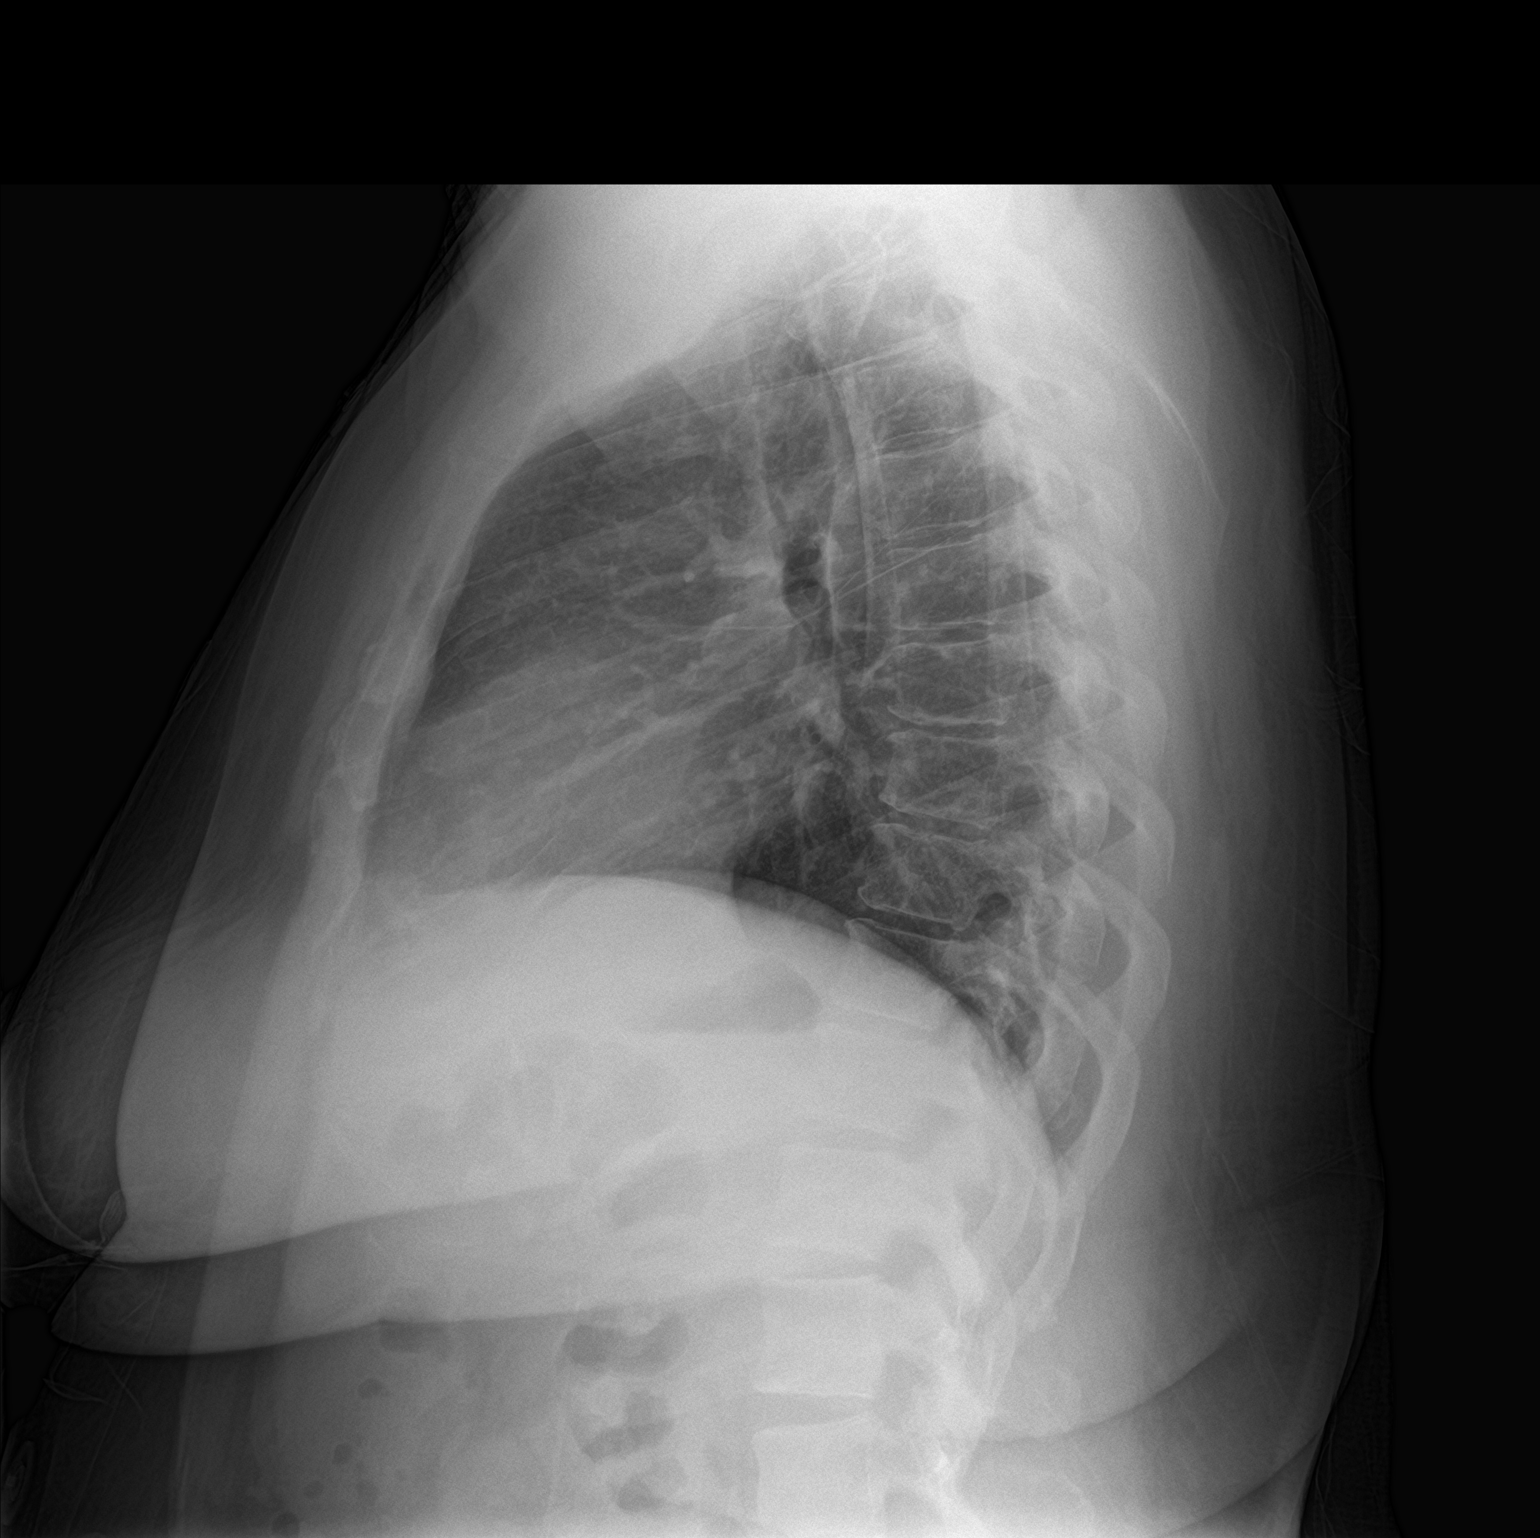

[2 of 2 positions shown; findings below may reference images not displayed]

FINDINGS: Heart and mediastinal contours are within normal limits. No focal
opacities or effusions. No acute bony abnormality.
IMPRESSION: No active cardiopulmonary disease.

## 2019-01-09 ENCOUNTER — Emergency Department (HOSPITAL_COMMUNITY)
Admission: EM | Admit: 2019-01-09 | Discharge: 2019-01-09 | Discharge status: Against Medical Advice | Payer: Medicare Other | Attending: Emergency Medicine | Admitting: Emergency Medicine

## 2019-01-09 ENCOUNTER — Encounter (HOSPITAL_COMMUNITY): Payer: Self-pay | Admitting: Emergency Medicine

## 2019-01-09 ENCOUNTER — Other Ambulatory Visit: Payer: Self-pay

## 2019-01-09 DIAGNOSIS — Z5321 Procedure and treatment not carried out due to patient leaving prior to being seen by health care provider: Secondary | ICD-10-CM | POA: Diagnosis not present

## 2019-01-09 DIAGNOSIS — R51 Headache: Secondary | ICD-10-CM | POA: Diagnosis present

## 2019-01-09 LAB — CBC
HCT: 37 % (ref 36.0–46.0)
Hemoglobin: 12.4 g/dL (ref 12.0–15.0)
MCH: 26.3 pg (ref 26.0–34.0)
MCHC: 33.5 g/dL (ref 30.0–36.0)
MCV: 78.6 fL — ABNORMAL LOW (ref 80.0–100.0)
Platelets: 448 10*3/uL — ABNORMAL HIGH (ref 150–400)
RBC: 4.71 MIL/uL (ref 3.87–5.11)
RDW: 15.6 % — ABNORMAL HIGH (ref 11.5–15.5)
WBC: 12.7 10*3/uL — ABNORMAL HIGH (ref 4.0–10.5)
nRBC: 0 % (ref 0.0–0.2)

## 2019-01-09 LAB — URINALYSIS, ROUTINE W REFLEX MICROSCOPIC
Bilirubin Urine: NEGATIVE
Glucose, UA: >=500 mg/dL — AB
Hgb urine dipstick: NEGATIVE
Ketones, ur: NEGATIVE mg/dL
Leukocytes,Ua: NEGATIVE
Nitrite: NEGATIVE
Protein, ur: 100 mg/dL — AB
Specific Gravity, Urine: 1.012 (ref 1.005–1.030)
pH: 7 (ref 5.0–8.0)

## 2019-01-09 LAB — BASIC METABOLIC PANEL
Anion gap: 9 (ref 5–15)
BUN: 12 mg/dL (ref 6–20)
CO2: 24 mmol/L (ref 22–32)
Calcium: 9.2 mg/dL (ref 8.9–10.3)
Chloride: 105 mmol/L (ref 98–111)
Creatinine, Ser: 1.29 mg/dL — ABNORMAL HIGH (ref 0.44–1.00)
GFR calc Af Amer: 58 mL/min — ABNORMAL LOW (ref >60)
GFR calc non Af Amer: 50 mL/min — ABNORMAL LOW (ref >60)
Glucose, Bld: 274 mg/dL — ABNORMAL HIGH (ref 70–99)
Potassium: 4.2 mmol/L (ref 3.5–5.1)
Sodium: 138 mmol/L (ref 135–145)

## 2019-01-09 LAB — I-STAT BETA HCG BLOOD, ED (MC, WL, AP ONLY): I-stat hCG, quantitative: <5 m[IU]/mL (ref <5)

## 2019-01-09 MED ORDER — SODIUM CHLORIDE 0.9% FLUSH: 3.0000 mL | Freq: Once | INTRAVENOUS | Status: DC

## 2019-01-09 NOTE — ED Triage Notes (Signed)
Pt reports R frontal Headache since Monday night with dizziness and nausea. Saw PCP today and had labs as part of routine physical, states her BP was high at pcp. Pt has taken motrin, percocet over the past few days without relief. Pt a/ox4, speech clear, face symmetrical, no weakness. Hx of migraines.

## 2019-01-14 MED FILL — !JANUVIA 100MG TABLET: 100 | 30 days supply | Qty: 30 | Fill #1

## 2019-01-14 MED FILL — TRUEPLUS 5-BEVEL PEN NEEDLE: 31G X 5 MM | 25 days supply | Qty: 100 | Fill #0

## 2019-01-14 MED FILL — !LANTUS SOLOSTAR 100UNITS/M: 100 | 30 days supply | Qty: 6 | Fill #1

## 2019-01-14 MED FILL — traZODone HCL 50 MG TABS: 50 | 30 days supply | Qty: 30 | Fill #1

## 2019-01-14 MED FILL — LEVOCETIRIZINE 5 MG TABLET: 5 | 30 days supply | Qty: 30 | Fill #1

## 2019-01-14 MED FILL — LOSARTAN-HCTZ 100-25 MG TAB: 100-25 | 30 days supply | Qty: 30 | Fill #1

## 2019-01-14 MED FILL — !VENTOLIN HFA INHALER: 108 (90 BAS | 18 days supply | Qty: 18 | Fill #1

## 2019-01-30 MED FILL — TRUEplus LANCETS 28G MISC: 25 days supply | Qty: 100 | Fill #0

## 2019-01-30 MED FILL — AMITRIPTYLINE HCL 25 MG TAB: 25 | 30 days supply | Qty: 30 | Fill #0

## 2019-01-30 MED FILL — !LANTUS SOLOSTAR 100UNITS/M: 100 | 30 days supply | Qty: 6 | Fill #0

## 2019-01-30 MED FILL — LOSARTAN POTASSIUM 50 MG TA: 50 | 30 days supply | Qty: 30 | Fill #0

## 2019-01-30 MED FILL — FLUTICASONE PROP 50 MCG SPR: 50 | 30 days supply | Qty: 16 | Fill #0

## 2019-01-30 MED FILL — GABAPENTIN 600 MG TABLET: 600 | 30 days supply | Qty: 45 | Fill #0

## 2019-01-30 MED FILL — !TRUE METRIX BLOOD GLUCOSE: 365 days supply | Qty: 1 | Fill #0

## 2019-01-30 MED FILL — ALBUTEROL SUL 2.5 MG/3 ML S: (2.5 MG/3ML | 7 days supply | Qty: 90 | Fill #0

## 2019-01-30 MED FILL — PRAVASTATIN SODIUM 20 MG TA: 20 | 30 days supply | Qty: 30 | Fill #0

## 2019-02-13 ENCOUNTER — Ambulatory Visit: Payer: Medicare Other

## 2019-02-15 ENCOUNTER — Encounter (HOSPITAL_COMMUNITY): Payer: Self-pay | Admitting: Emergency Medicine

## 2019-02-15 ENCOUNTER — Emergency Department (HOSPITAL_COMMUNITY)
Admission: EM | Admit: 2019-02-15 | Discharge: 2019-02-15 | Disposition: A | Discharge status: Home / Self Care | Payer: Medicare Other | Attending: Emergency Medicine | Admitting: Emergency Medicine

## 2019-02-15 ENCOUNTER — Other Ambulatory Visit: Payer: Self-pay

## 2019-02-15 ENCOUNTER — Emergency Department (HOSPITAL_COMMUNITY): Payer: Medicare Other

## 2019-02-15 DIAGNOSIS — R0789 Other chest pain: Secondary | ICD-10-CM | POA: Diagnosis present

## 2019-02-15 DIAGNOSIS — Y999 Unspecified external cause status: Secondary | ICD-10-CM | POA: Diagnosis not present

## 2019-02-15 DIAGNOSIS — Y9389 Activity, other specified: Secondary | ICD-10-CM | POA: Diagnosis not present

## 2019-02-15 DIAGNOSIS — I10 Essential (primary) hypertension: Secondary | ICD-10-CM | POA: Insufficient documentation

## 2019-02-15 DIAGNOSIS — E119 Type 2 diabetes mellitus without complications: Secondary | ICD-10-CM | POA: Diagnosis not present

## 2019-02-15 DIAGNOSIS — J45909 Unspecified asthma, uncomplicated: Secondary | ICD-10-CM | POA: Insufficient documentation

## 2019-02-15 DIAGNOSIS — M549 Dorsalgia, unspecified: Secondary | ICD-10-CM | POA: Insufficient documentation

## 2019-02-15 DIAGNOSIS — Z87891 Personal history of nicotine dependence: Secondary | ICD-10-CM | POA: Diagnosis not present

## 2019-02-15 DIAGNOSIS — Y92410 Unspecified street and highway as the place of occurrence of the external cause: Secondary | ICD-10-CM | POA: Diagnosis not present

## 2019-02-15 DIAGNOSIS — M545 Low back pain: Secondary | ICD-10-CM | POA: Diagnosis not present

## 2019-02-15 DIAGNOSIS — I2510 Atherosclerotic heart disease of native coronary artery without angina pectoris: Secondary | ICD-10-CM | POA: Diagnosis not present

## 2019-02-15 DIAGNOSIS — Z794 Long term (current) use of insulin: Secondary | ICD-10-CM | POA: Diagnosis not present

## 2019-02-15 DIAGNOSIS — Z79899 Other long term (current) drug therapy: Secondary | ICD-10-CM | POA: Insufficient documentation

## 2019-02-15 LAB — POC URINE PREG, ED: Preg Test, Ur: NEGATIVE

## 2019-02-15 MED ORDER — ALBUTEROL SULFATE HFA 108 (90 BASE) MCG/ACT IN AERS
8.0000 | INHALATION_SPRAY | Freq: Once | RESPIRATORY_TRACT | Status: AC
  Administered 2019-02-15: 8 via RESPIRATORY_TRACT
  Filled 2019-02-15: qty 6.7

## 2019-02-15 MED ORDER — HYDROCODONE-ACETAMINOPHEN 5-325 MG PO TABS
2.0000 | ORAL_TABLET | Freq: Once | ORAL | Status: AC
  Administered 2019-02-15: 2 via ORAL
  Filled 2019-02-15: qty 2

## 2019-02-15 NOTE — ED Notes (Signed)
Patient transported to X-ray 

## 2019-02-15 NOTE — Discharge Instructions (Addendum)
You were evaluated in the Emergency Department and after careful evaluation, we did not find any emergent condition requiring admission or further testing in the hospital.  Your exam/testing today was overall reassuring.  Your x-rays did not show any broken bones or emergencies.  Please return to the Emergency Department if you experience any worsening of your condition.  We encourage you to follow up with a primary care provider.  Thank you for allowing [place] to be a part of your care.
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
## 2019-02-15 NOTE — ED Notes (Signed)
Pt requested 2 cups of cranberry juice bc she is "really thirsty".
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
## 2019-02-15 NOTE — ED Notes (Signed)
PT able to transfer to wheelchair.
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
## 2019-02-15 NOTE — ED Notes (Addendum)
Pt ambulated to restroom. 

## 2019-03-03 MED FILL — RAMELTEON 8 MG TABS: 8 | 30 days supply | Qty: 30 | Fill #0

## 2019-03-03 MED FILL — GLIMEPIRIDE 2 MG TABS: 2 | 14 days supply | Qty: 14 | Fill #0

## 2019-03-17 IMAGING — DX DG ABDOMEN 2V
2 series · 2 of 2 positions shown · non-contrast
Comparison: 04/07/2016.

CLINICAL DATA: Nausea, no bowel complaints.Left sided abdominal
painHx of DM, iron deficiency anemia, nausea and vomiting, coronary
artery disease, pyelonephritis, Hep B, peripheral neuropathy, PNA,
gastroparesis

EXAM:
ABDOMEN - 2 VIEW

[abdomen erect]
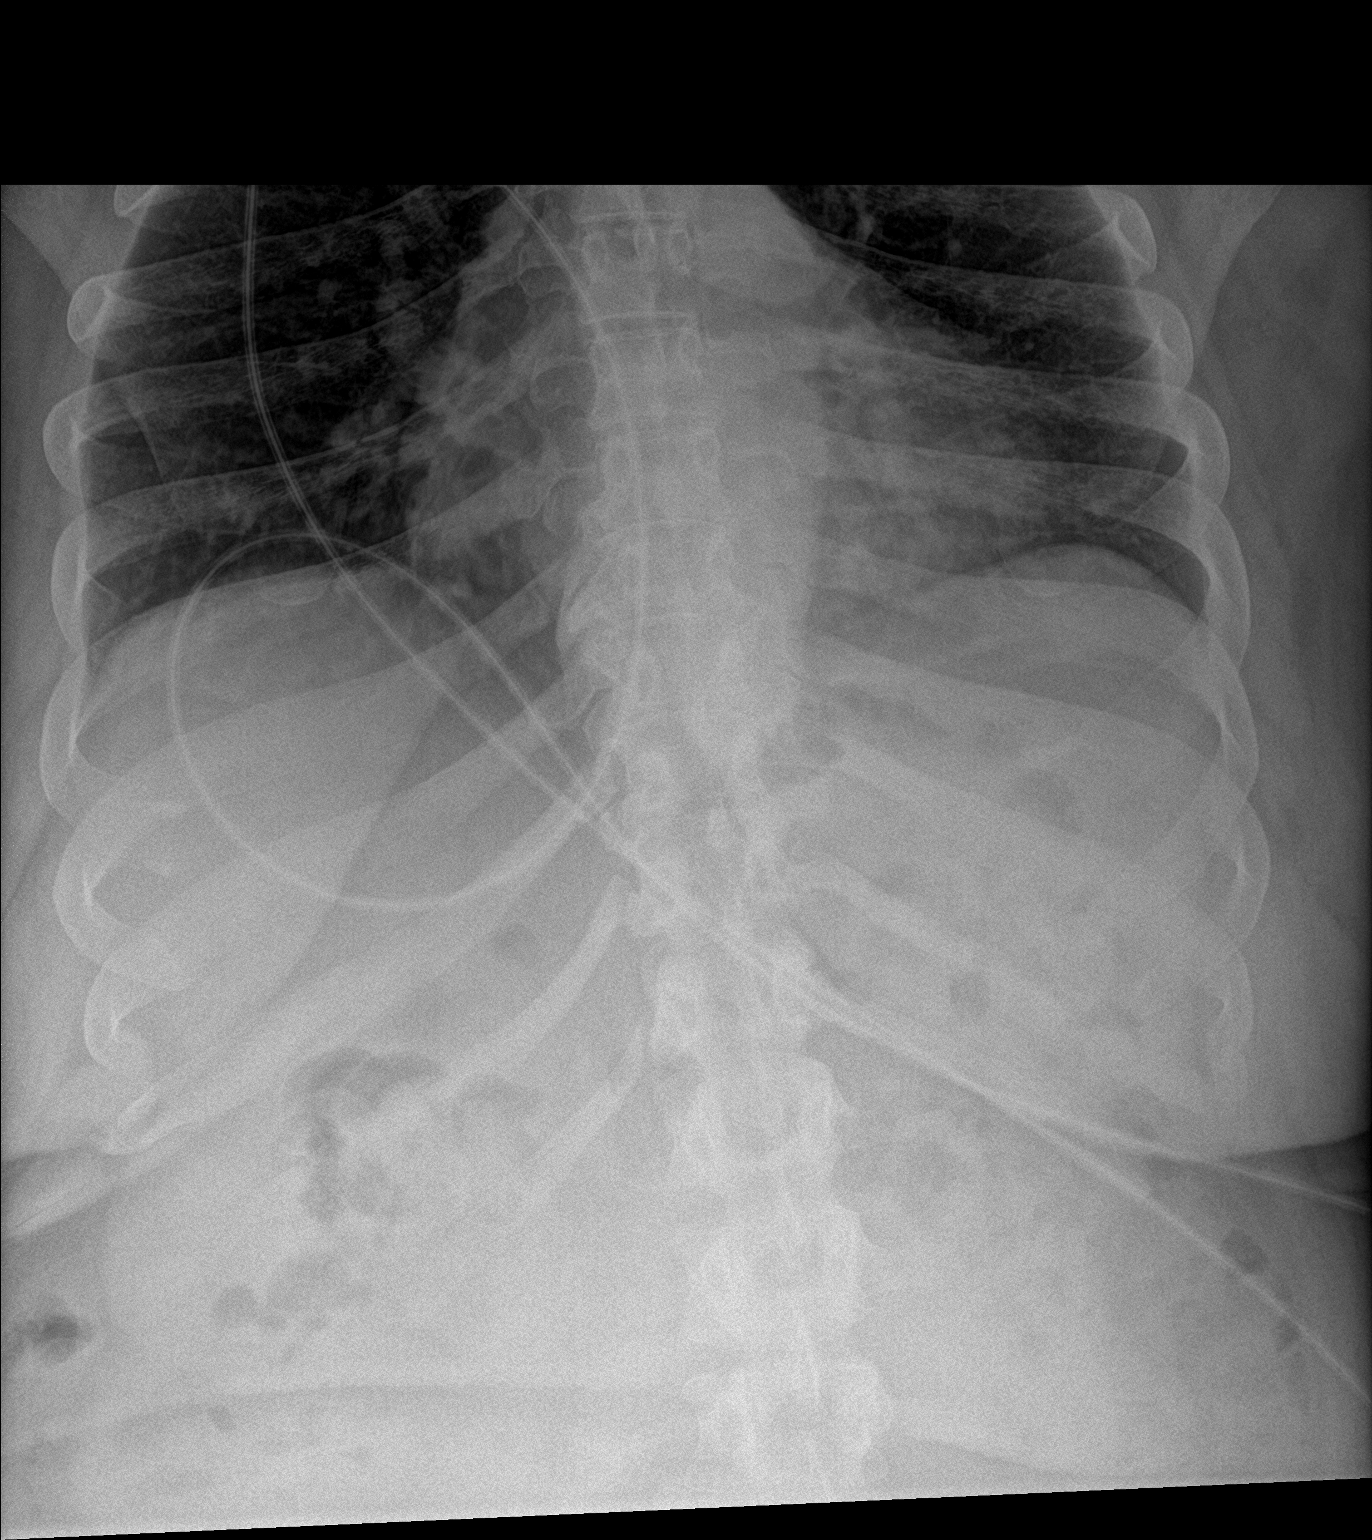

[abdomen supine]
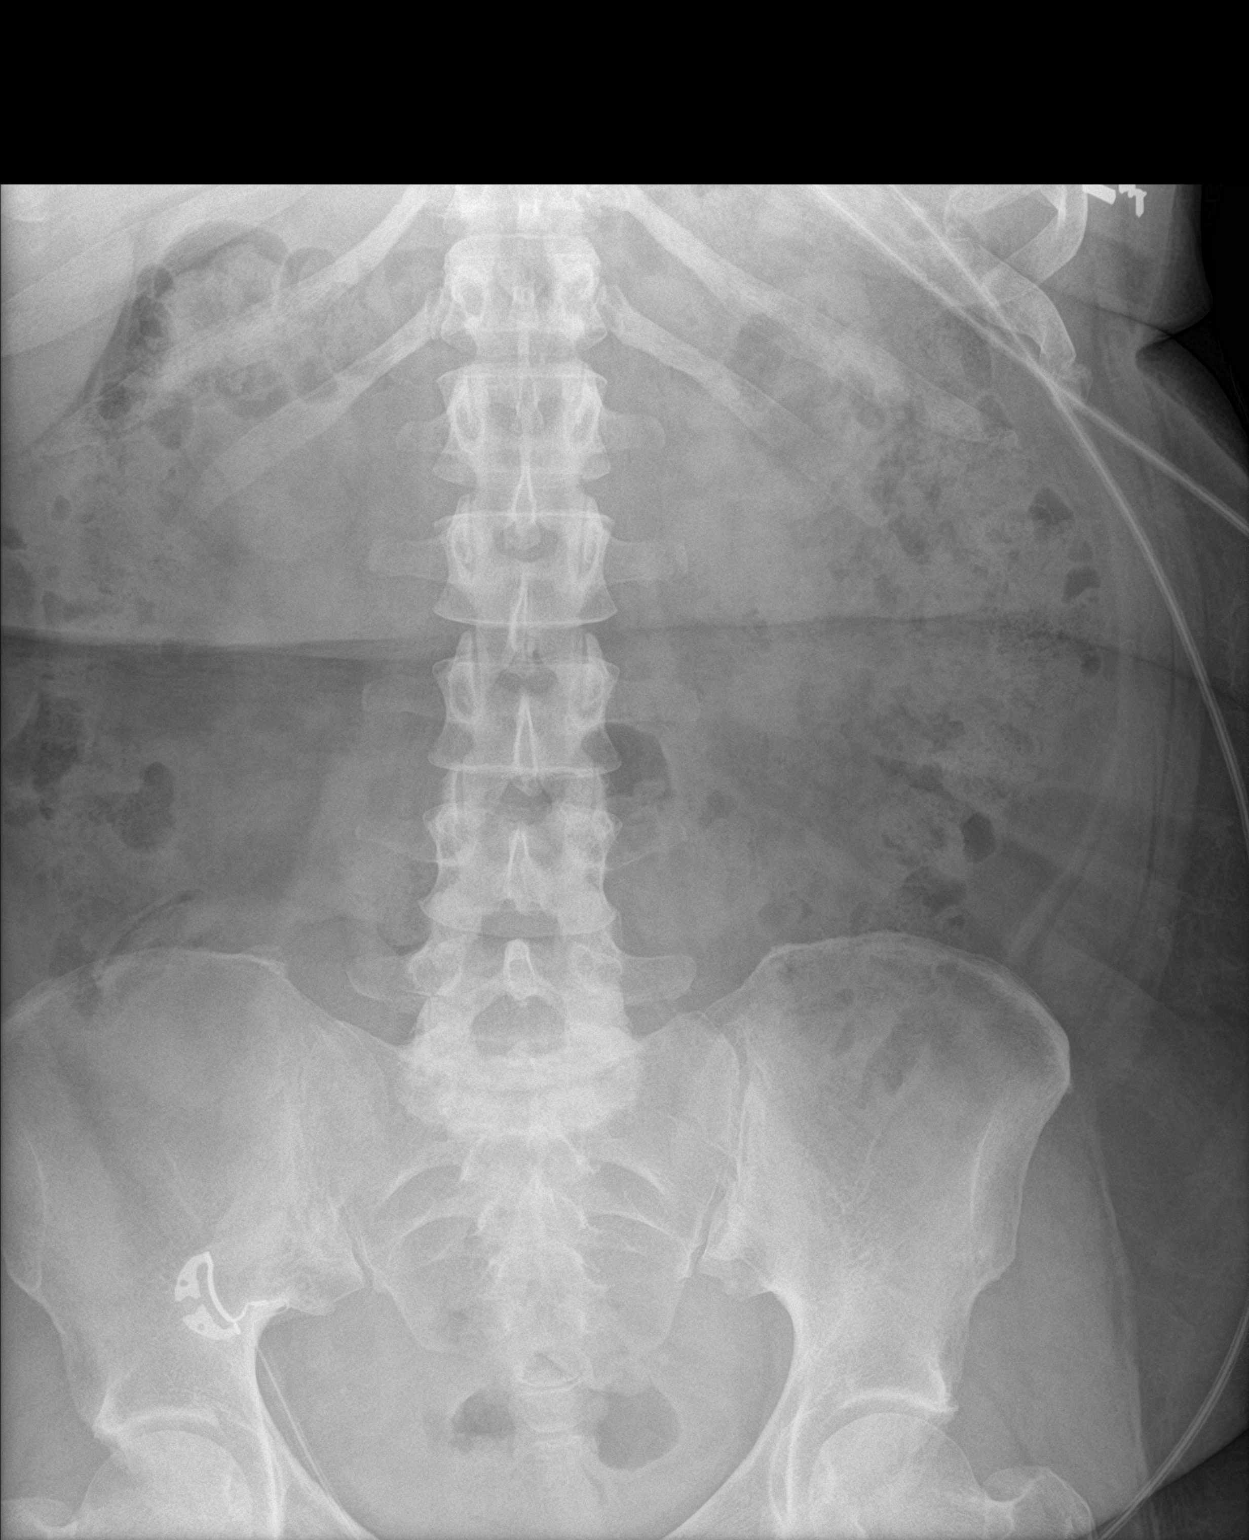

[2 of 2 positions shown; findings below may reference images not displayed]

FINDINGS: Normal bowel gas pattern.  No free air.

Mild increased stool throughout the colon.

No evidence of renal or ureteral stones. Soft tissues are
unremarkable.

Skeletal structures are unremarkable.
IMPRESSION: 1. No acute findings.  No evidence of bowel obstruction.
2. Mild increase in colonic stool burden.

## 2019-03-28 MED FILL — GLIMEPIRIDE 4 MG TABS: 4 | 90 days supply | Qty: 90 | Fill #0

## 2019-03-28 MED FILL — tiZANidine HCL 4 MG TABS: 4 | 30 days supply | Qty: 90 | Fill #0

## 2019-03-28 MED FILL — AMLODIPINE BESYLATE 5 MG TA: 5 | 90 days supply | Qty: 90 | Fill #0

## 2019-04-29 IMAGING — CT CT ABD-PELV W/ CM
2 of 5 series · 17 of 46 positions shown, 19 images · IV contrast (APPLIED)
Comparison: 11/27/2015 CT and prior studies

CLINICAL DATA: 40-year-old female with acute abdominal pain with
nausea and vomiting for 1 day.

EXAM:
CT ABDOMEN AND PELVIS WITH CONTRAST
TECHNIQUE: Multidetector CT imaging of the abdomen and pelvis was performed
using the standard protocol following bolus administration of
intravenous contrast.
CONTRAST:  100mL XLQH5Q-WBB IOPAMIDOL (XLQH5Q-WBB) INJECTION 61%

[Series 3: abd/ pelvis 5.0 i30f 2 · axial · 0.92mm/px · z∈[+822,+1272]mm · 14 of 101 slices shown, 16 images]
[im 6/101  soft-tissue]
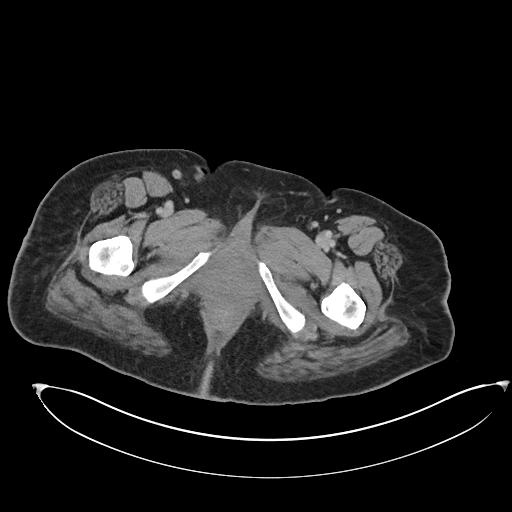
[im 6/101  bone]
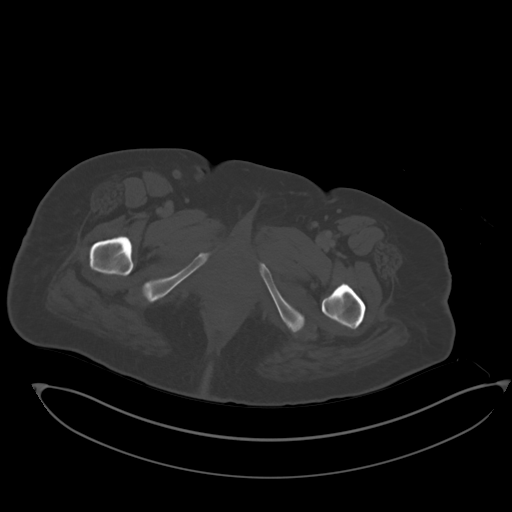
[im 16/101  soft-tissue]
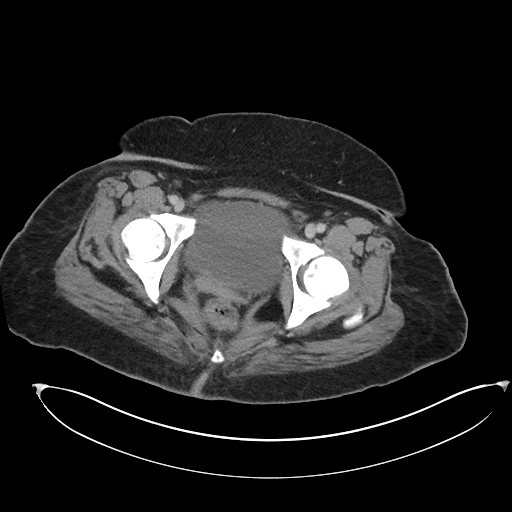
[im 21/101  soft-tissue]
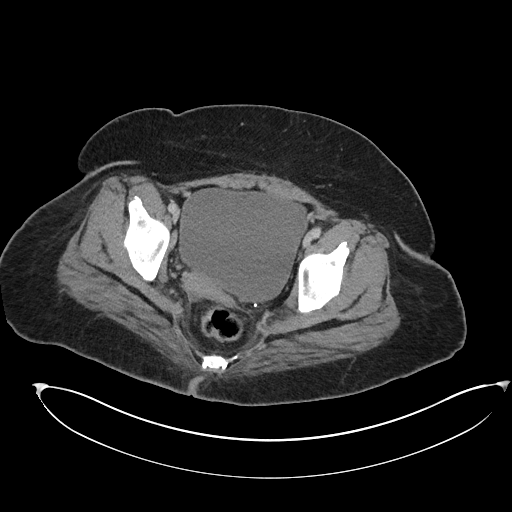
[im 26/101  soft-tissue]
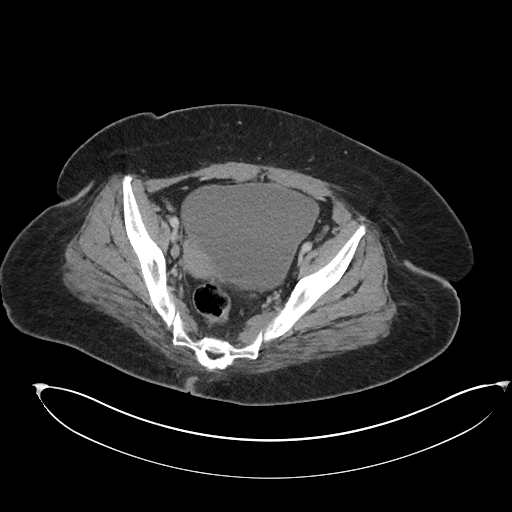
[im 36/101  soft-tissue]
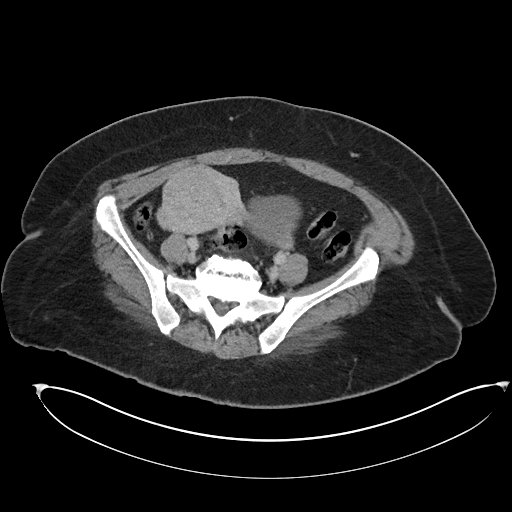
[im 41/101  soft-tissue]
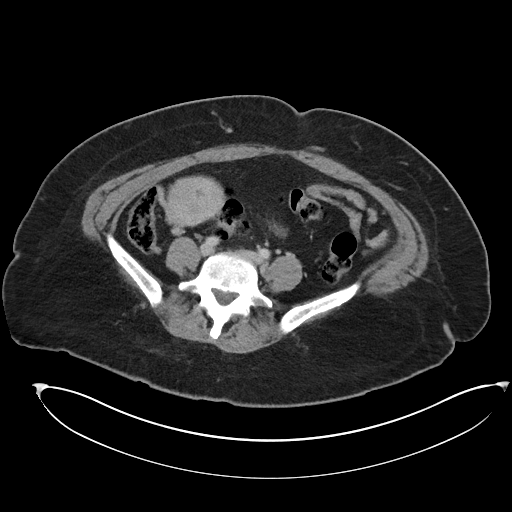
[im 46/101  soft-tissue]
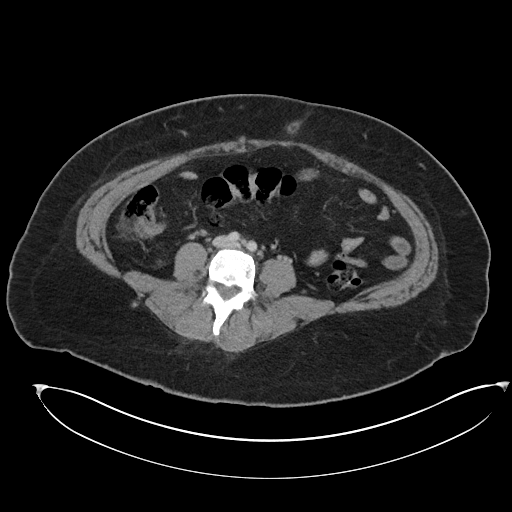
[im 56/101  soft-tissue]
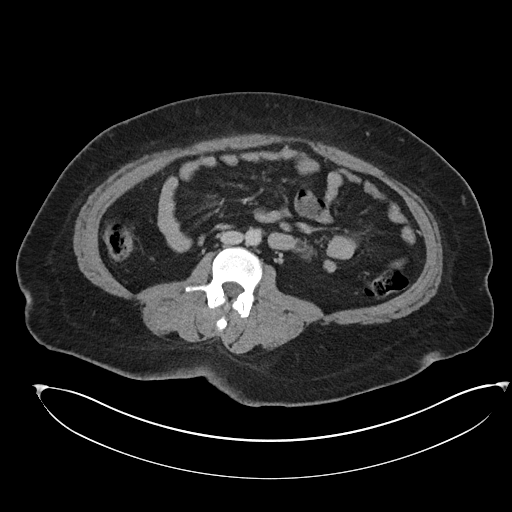
[im 61/101  soft-tissue]
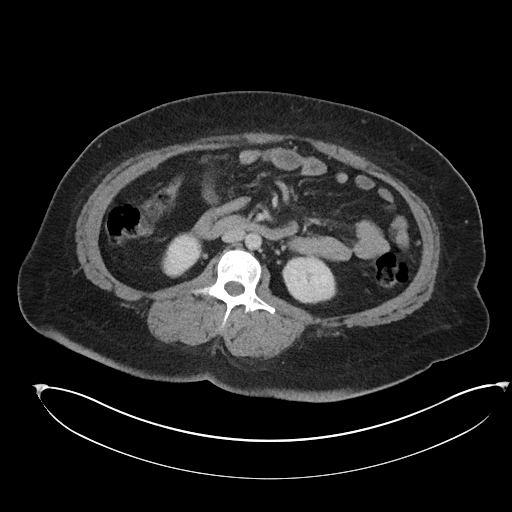
[im 61/101  bone]
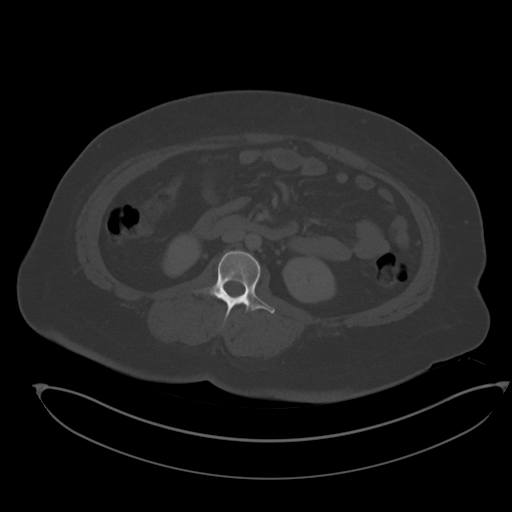
[im 66/101  soft-tissue]
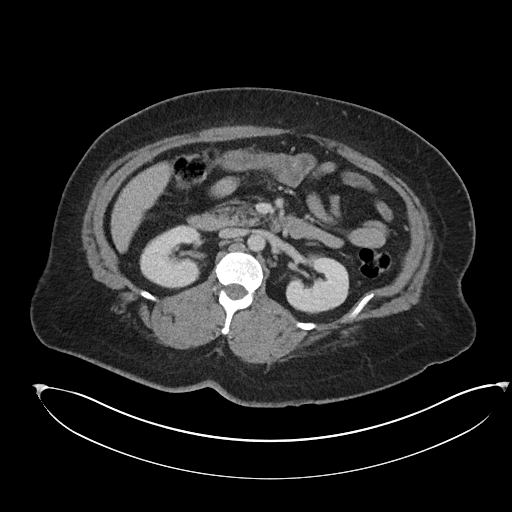
[im 76/101  soft-tissue]
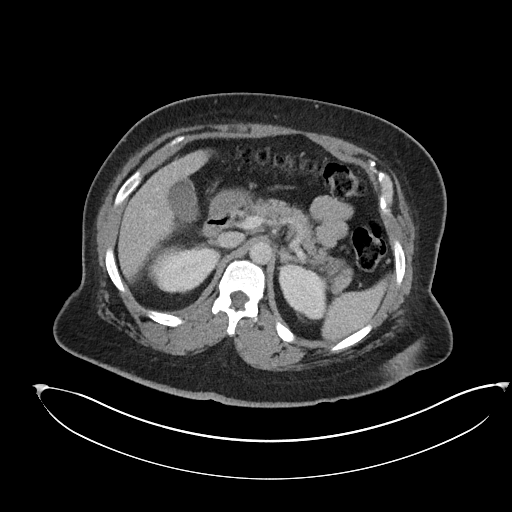
[im 81/101  soft-tissue]
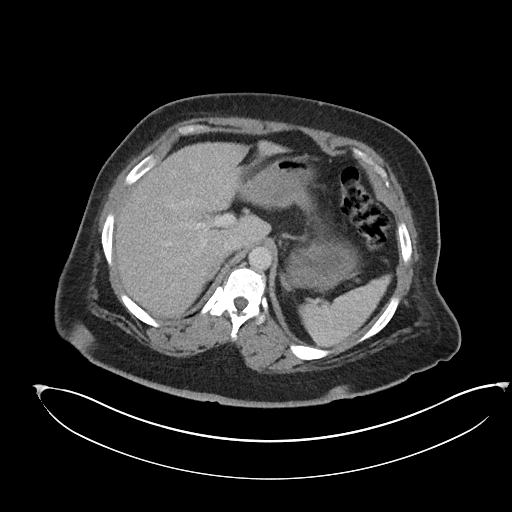
[im 86/101  soft-tissue]
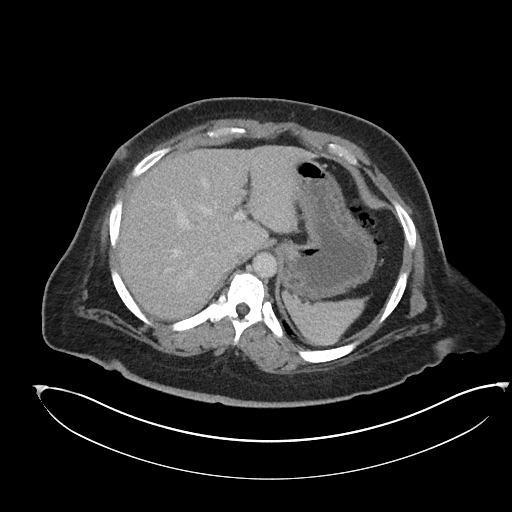
[im 96/101  soft-tissue]
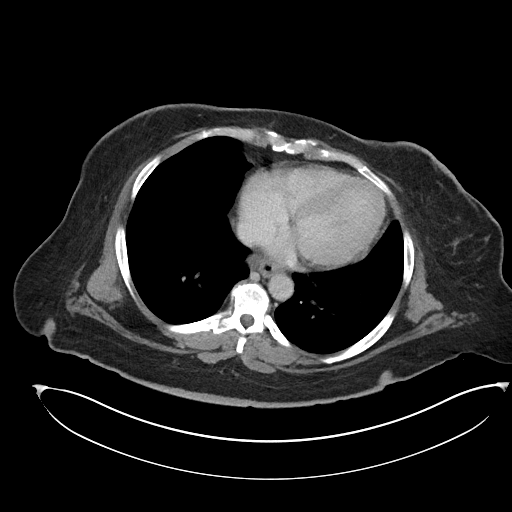

[Series 7: coronal soft tissue · coronal · 0.98mm/px · 3 of 112 slices shown]
[im 38/112  soft-tissue]
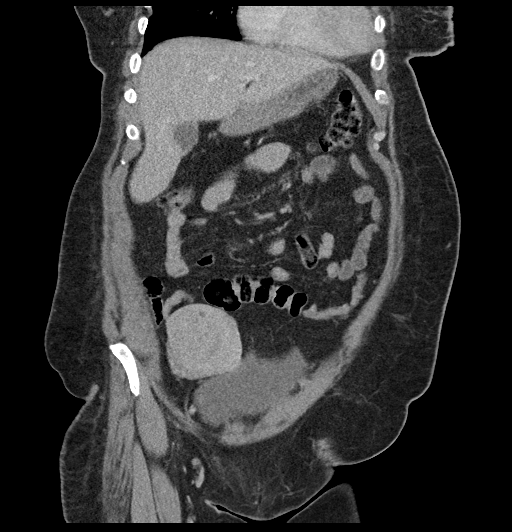
[im 50/112  soft-tissue]
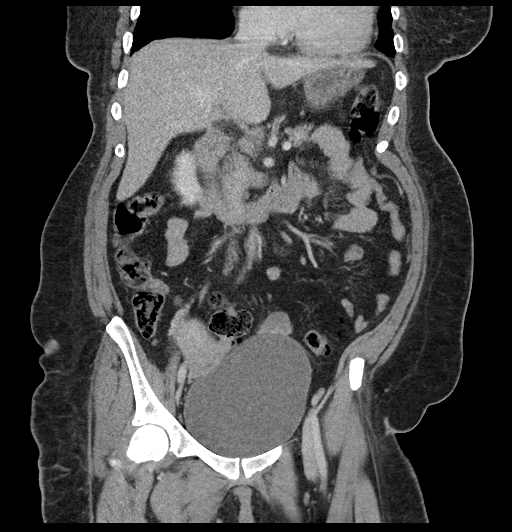
[im 62/112  soft-tissue]
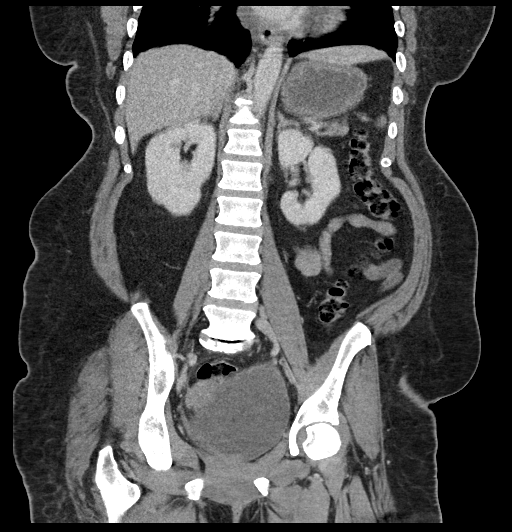

[17 of 46 positions shown; findings below may reference images not displayed]

FINDINGS: Lower chest: No acute abnormality

Hepatobiliary: The liver and gallbladder are unremarkable. There is
no evidence of biliary dilatation.

Pancreas: Unremarkable

Spleen: Unremarkable

Adrenals/Urinary Tract: The kidneys, adrenal glands and bladder are
unremarkable.

Stomach/Bowel: Stomach is within normal limits. Appendix appears
normal. No evidence of bowel wall thickening, distention, or
inflammatory changes.

Vascular/Lymphatic: No significant vascular findings are present. No
enlarged abdominal or pelvic lymph nodes.

Reproductive: 4.9 cm uterine fibroid again noted. No acute
abnormalities.

Other: No free fluid, focal collection or pneumoperitoneum.

Musculoskeletal: No acute or significant osseous findings. Moderate
degenerative disc disease at L5-S1 again noted.
IMPRESSION: No evidence of acute abnormality.

Uterine fibroid again noted.

## 2019-05-30 IMAGING — CT CT RENAL STONE PROTOCOL
2 of 4 series · 16 of 46 positions shown, 18 images · non-contrast
Comparison: CT Abdomen and Pelvis 09/23/2016 and earlier.

CLINICAL DATA: 41-year-old female with right flank and back pain
since yesterday.

EXAM:
CT ABDOMEN AND PELVIS WITHOUT CONTRAST
TECHNIQUE: Multidetector CT imaging of the abdomen and pelvis was performed
following the standard protocol without IV contrast.

[Series 3: stone study 5.0 i30f 2 · axial · 0.81mm/px · z∈[+733,+1173]mm · 13 of 96 slices shown, 15 images]
[im 4/96  soft-tissue]
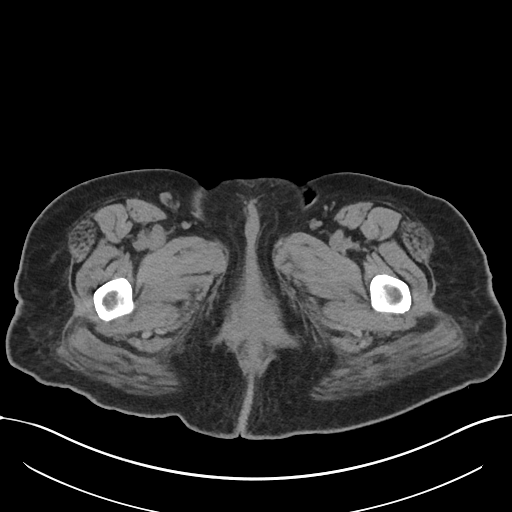
[im 4/96  bone]
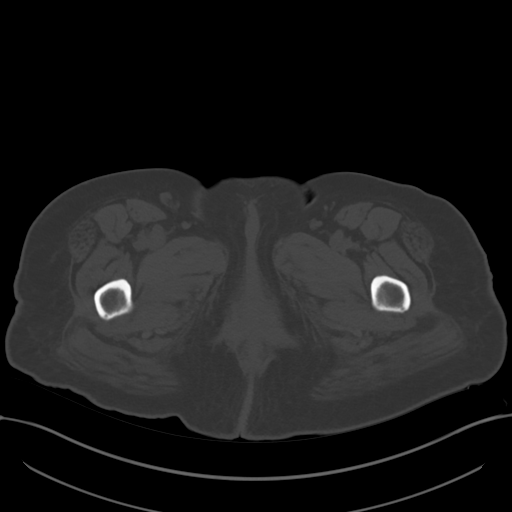
[im 12/96  soft-tissue]
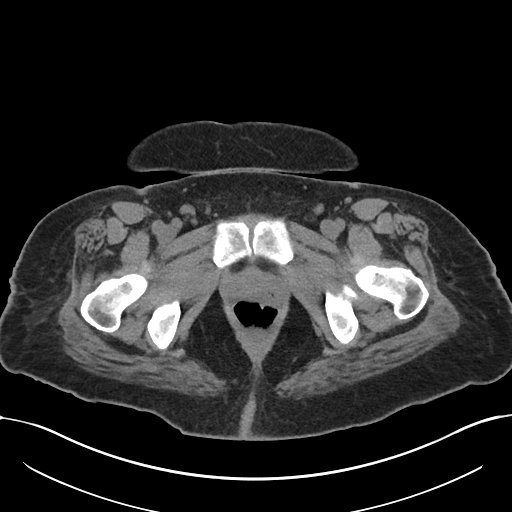
[im 20/96  soft-tissue]
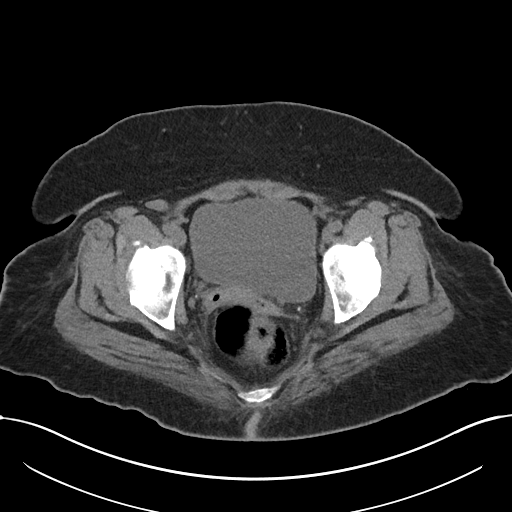
[im 27/96  soft-tissue]
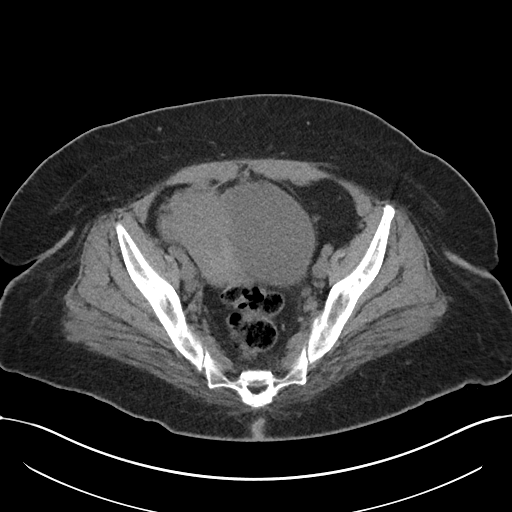
[im 35/96  soft-tissue]
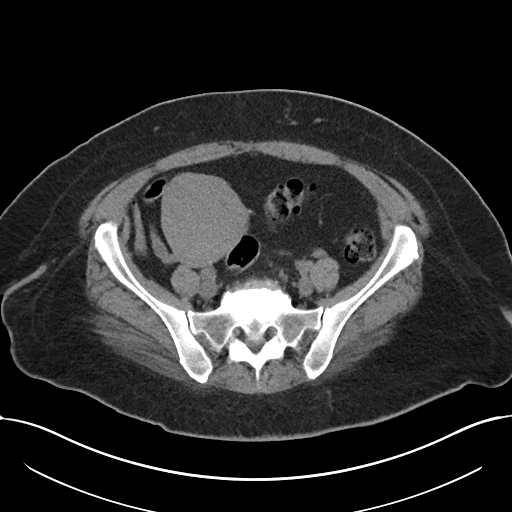
[im 42/96  soft-tissue]
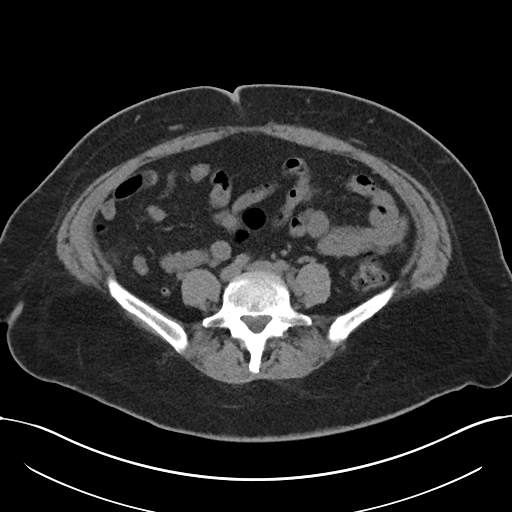
[im 50/96  soft-tissue]
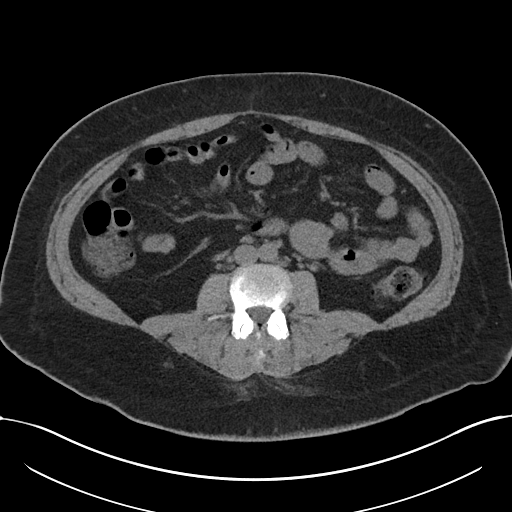
[im 54/96  soft-tissue]
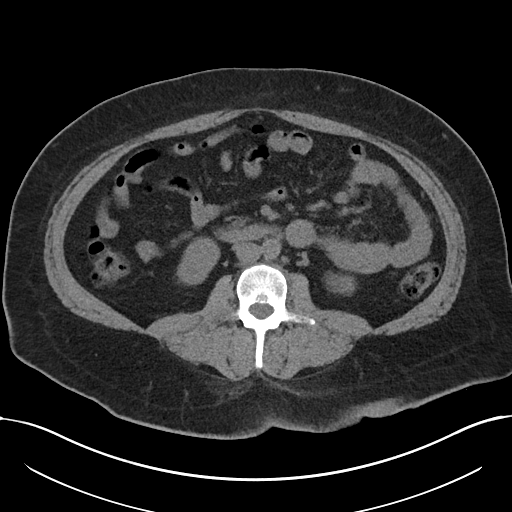
[im 61/96  soft-tissue]
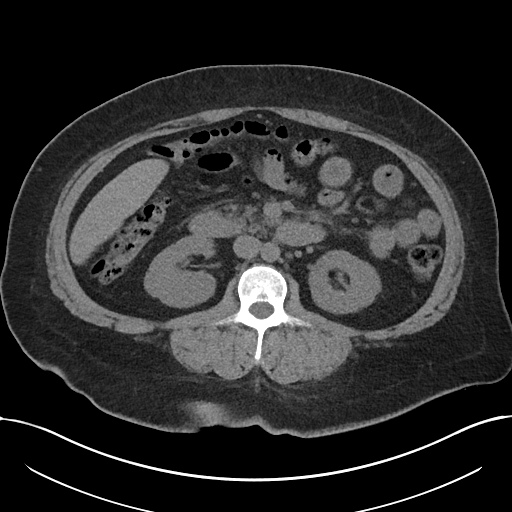
[im 61/96  bone]
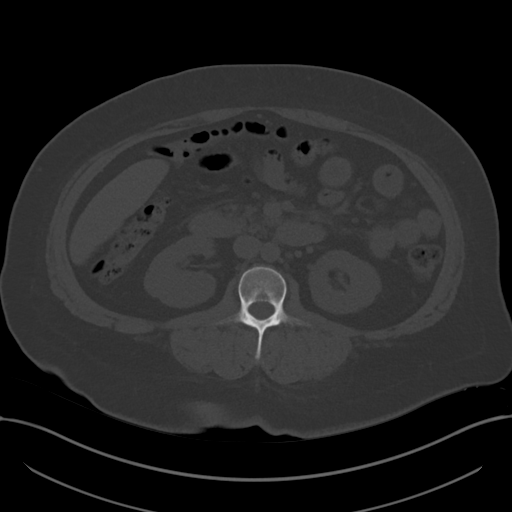
[im 69/96  soft-tissue]
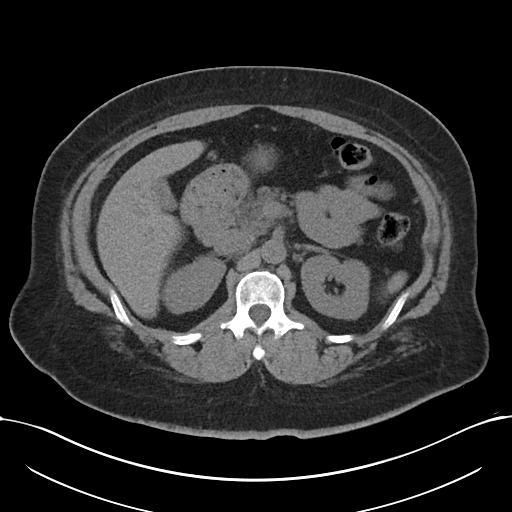
[im 77/96  soft-tissue]
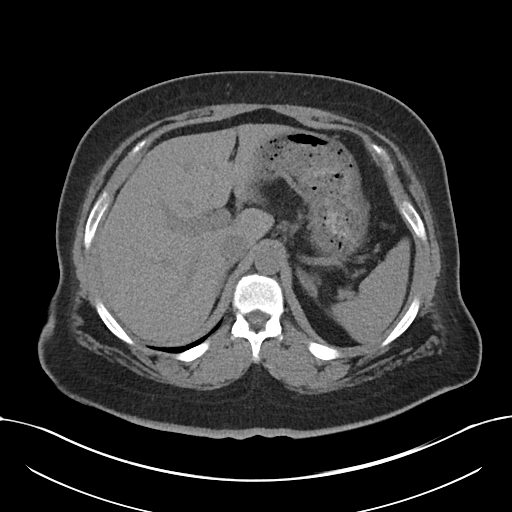
[im 84/96  soft-tissue]
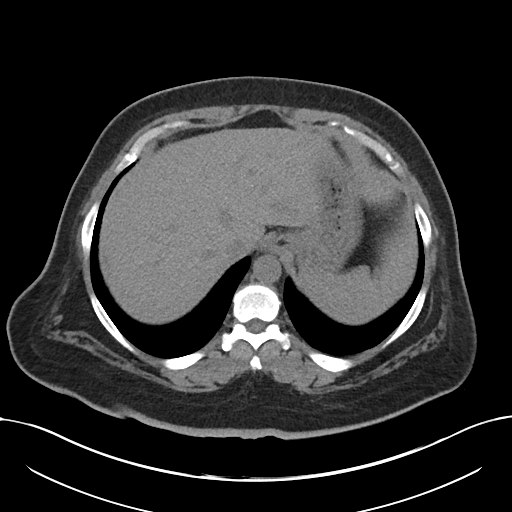
[im 92/96  soft-tissue]
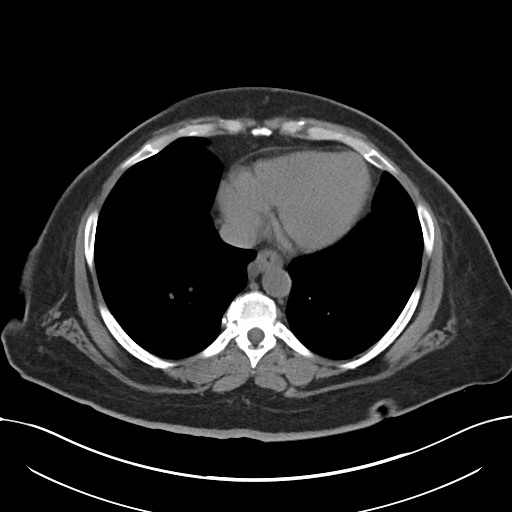

[Series 6: coronal soft tissue · coronal · 0.93mm/px · 3 of 101 slices shown]
[im 34/101  soft-tissue]
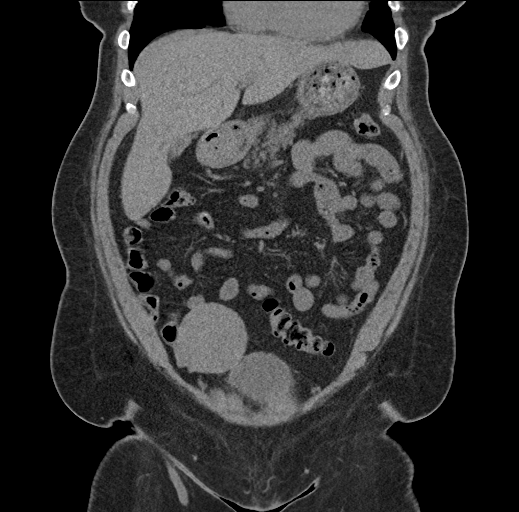
[im 45/101  soft-tissue]
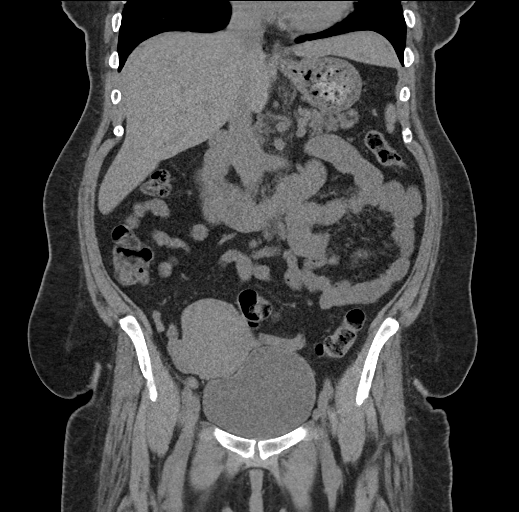
[im 56/101  soft-tissue]
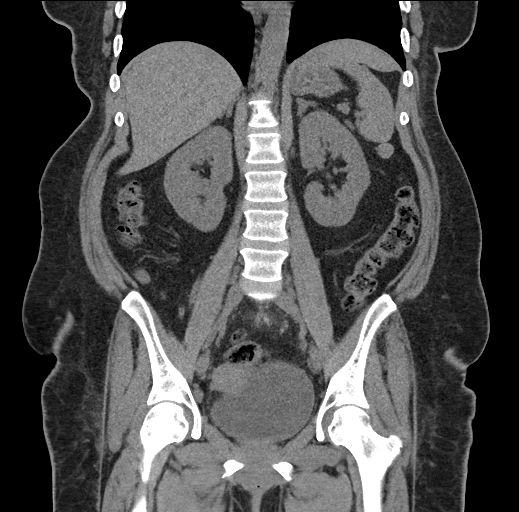

[16 of 46 positions shown; findings below may reference images not displayed]

FINDINGS: Lower chest: Negative lung bases. No pleural effusion. Borderline to
mild cardiomegaly. No pericardial effusion, although there might be
a small right posterior cardiophrenic angle cyst (such as small
pericardial cyst) which has not significantly changed since 0555.

Hepatobiliary: Negative noncontrast liver and gallbladder.

Pancreas: Stable fatty atrophy of the pancreatic head and uncinate.

Spleen: Negative.

Adrenals/Urinary Tract: Normal adrenal glands.

Negative noncontrast left kidney and left ureter.

Unremarkable urinary bladder.

Negative noncontrast right kidney and right ureter. Small pelvic
phleboliths are stable. No urologic calculus identified.

Stomach/Bowel: Negative sigmoid colon and rectum except for some
retained stool. Similar retained stool in the left colon. Negative
transverse colon. Negative right colon and appendix. Negative
terminal ileum. No dilated small bowel. Negative stomach and
duodenum.

No abdominal free fluid.

Vascular/Lymphatic: Vascular patency is not evaluated in the absence
of IV contrast.

No lymphadenopathy.

Reproductive: Chronic rounded enlargement of the uterine fundus
suggesting a right eccentric fundal fibroid, stable. Adnexa appears
stable and within normal limits.

Other: No pelvic free fluid.

Musculoskeletal: Chronic lumbosacral junction disc and endplate
degeneration. No acute osseous abnormality identified.
IMPRESSION: 1. No urologic calculus or obstructive uropathy.
2. No acute or inflammatory process identified.  Normal appendix.
3. Fibroid uterus.
4. Advanced chronic L5-S1 disc degeneration.

## 2019-06-05 IMAGING — CT CT ANGIO CHEST
2 of 6 series · 18 of 36 positions shown · IV contrast (isovue)
Comparison: Chest x-ray 10/30/2016

CLINICAL DATA: Sob, chest pain, cough, recent DVT in right leg on
10/24/16. 100 ml of isovue 370 given

EXAM:
CT ANGIOGRAPHY CHEST WITH CONTRAST
TECHNIQUE: Multidetector CT imaging of the chest was performed using the
standard protocol during bolus administration of intravenous
contrast. Multiplanar CT image reconstructions and MIPs were
obtained to evaluate the vascular anatomy.
CONTRAST:  100 cc Isovue 370

[Series 8: pe thins · axial · 0.70mm/px · z∈[+1215,+1486]mm · 17 of 305 slices shown]
[im 17/305  lung]
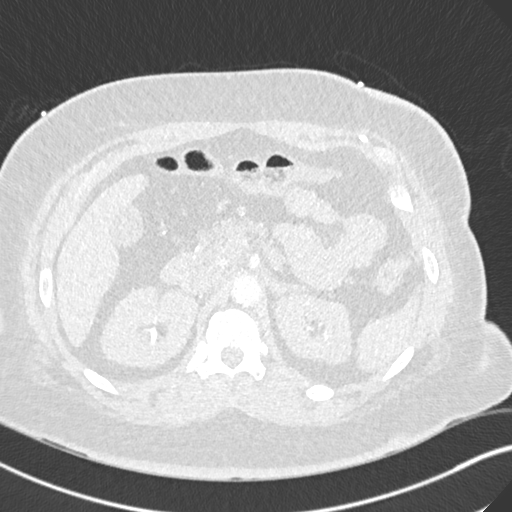
[im 34/305  mediastinal]
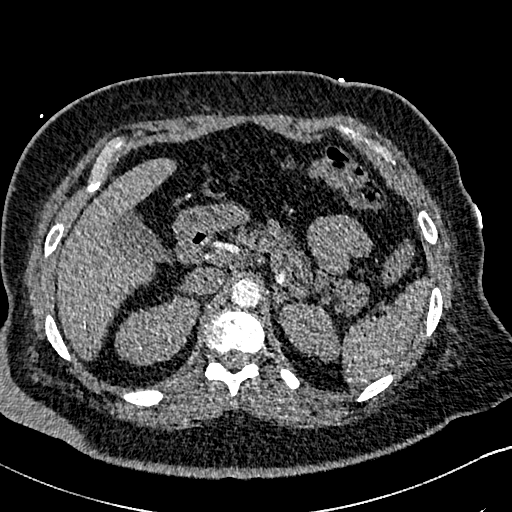
[im 51/305  lung]
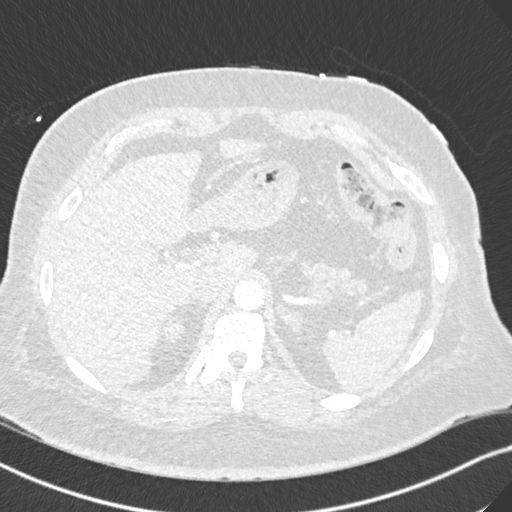
[im 68/305  mediastinal]
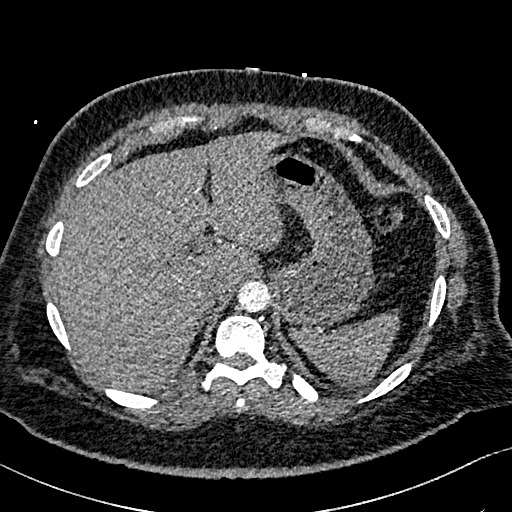
[im 85/305  lung]
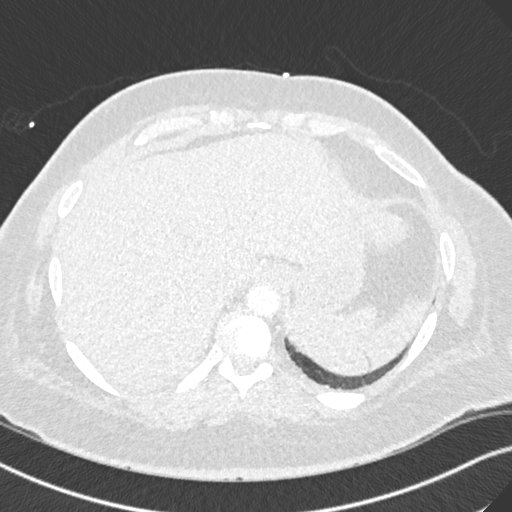
[im 102/305  mediastinal]
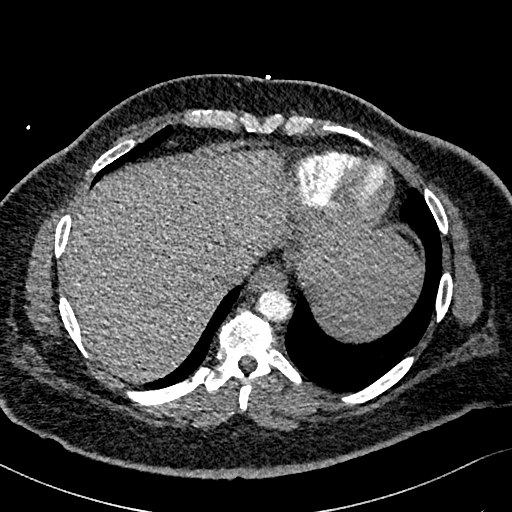
[im 119/305  lung]
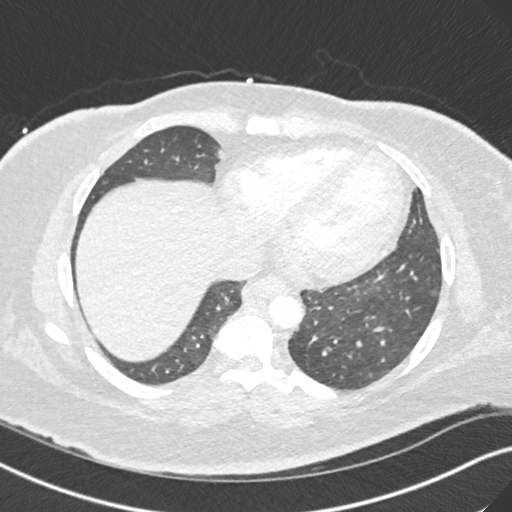
[im 136/305  mediastinal]
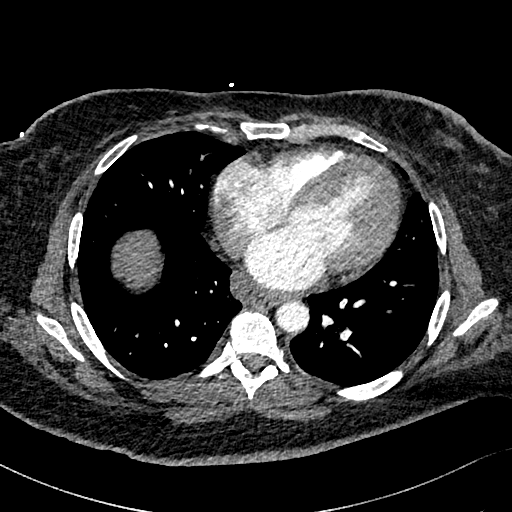
[im 153/305  lung]
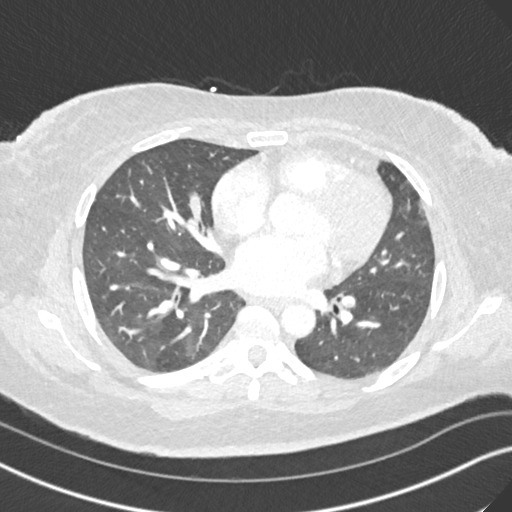
[im 169/305  mediastinal]
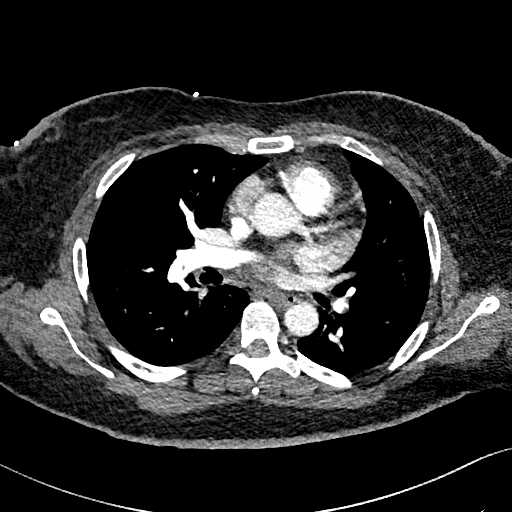
[im 186/305  lung]
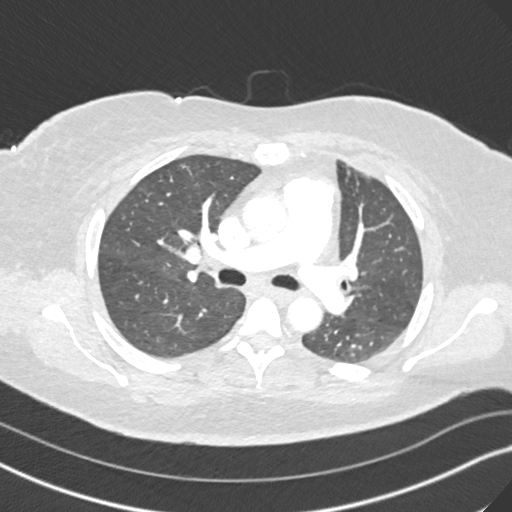
[im 203/305  mediastinal]
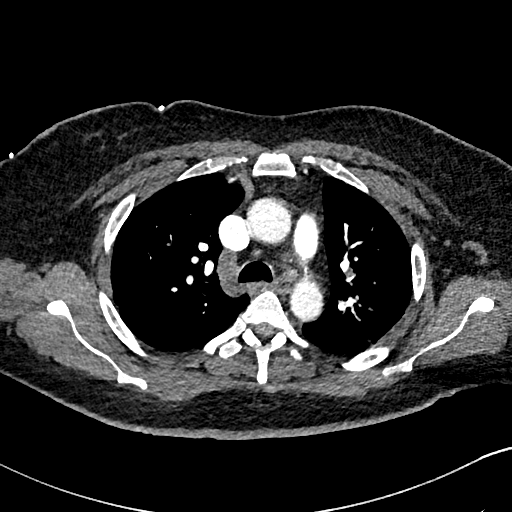
[im 220/305  lung]
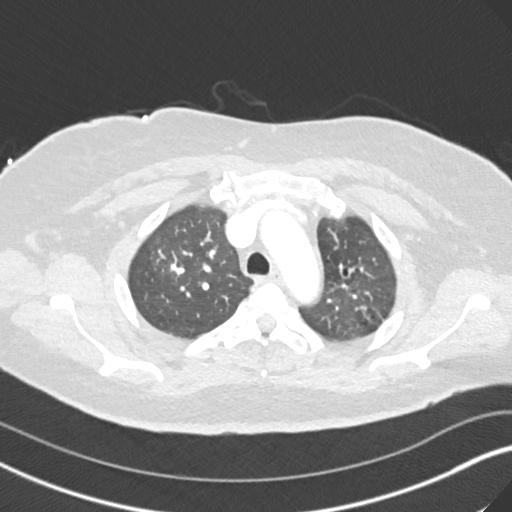
[im 237/305  mediastinal]
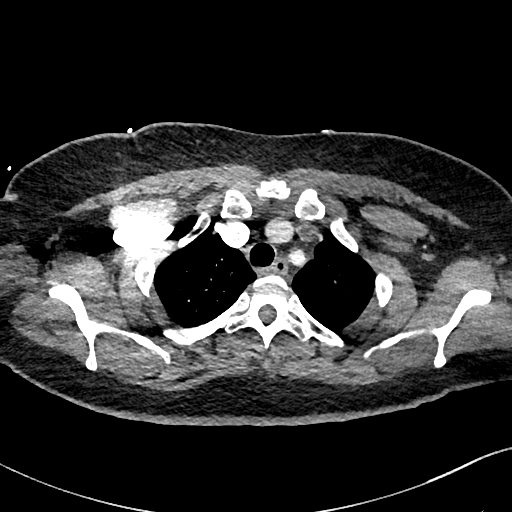
[im 254/305  lung]
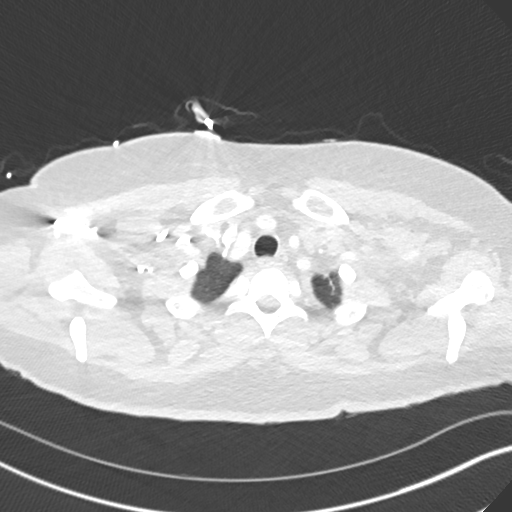
[im 271/305  mediastinal]
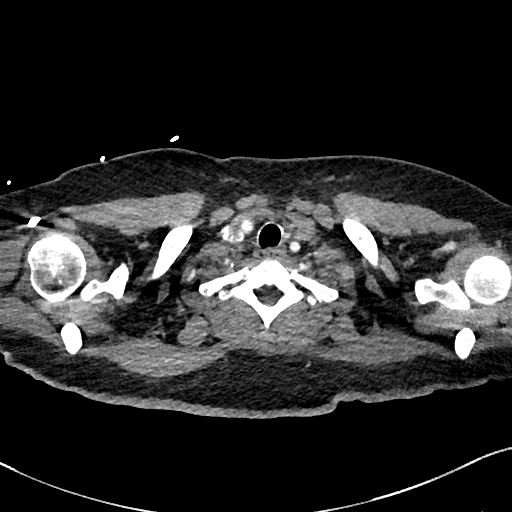
[im 288/305  lung]
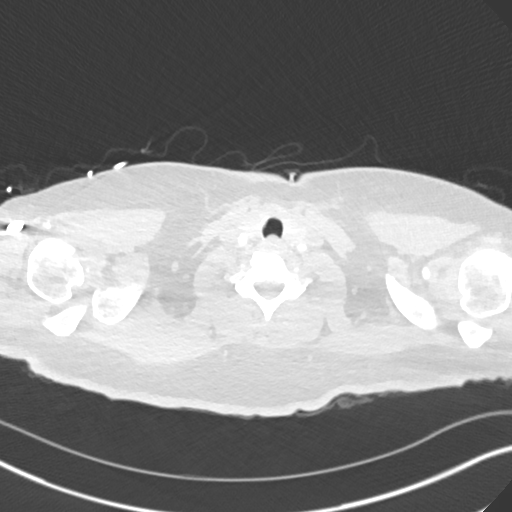

[Series 9: pe 2mm cor · coronal · 0.62mm/px · 1 of 135 slices shown]
[im 68/135  mediastinal]
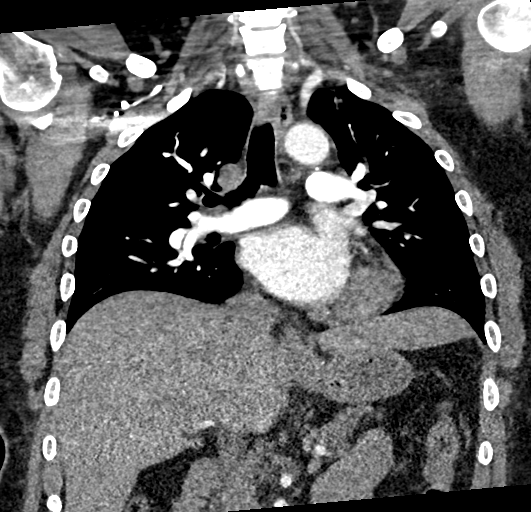

[18 of 36 positions shown; findings below may reference images not displayed]

FINDINGS: Cardiovascular: There is significant clot within the pulmonary
arteries bilaterally. Emboli are identified with right upper lobe,
right middle lobe, and right lower lobe branches of the pulmonary
artery. Clot is identified within the left upper lobe and left lower
lobe branches of the pulmonary artery. Normal RV: LV ratio. Heart is
normal in appearance. No pericardial effusion. No significant
atherosclerosis of the thoracic aorta. No aneurysm.

Mediastinum/Nodes: The visualized portion of the thyroid gland has a
normal appearance. No mediastinal, hilar, or axillary adenopathy.
Esophagus is normal in appearance.

Lungs/Pleura: Minimal atelectasis versus infarction in the lingula.
No pleural effusion. There is cylindrical bronchiectasis associated
scarring in the left upper lobe. No suspicious pulmonary nodules.

Upper Abdomen: Gallbladder is present. Upper abdomen is
unremarkable.

Musculoskeletal: No chest wall abnormality. No acute or significant
osseous findings.

Review of the MIP images confirms the above findings.
IMPRESSION: 1. Study is positive for bilateral pulmonary emboli. No evidence for
right heart strain.
2. Atelectasis versus infarct the lingula.
3. Bronchiectasis and scarring in the left upper lobe.
Critical Value/emergent results were called by telephone at the time
of interpretation on 10/30/2016 at [DATE] to Yonas Abrham Portughes, PA, who
verbally acknowledged these results.

## 2019-06-05 IMAGING — DX DG CHEST 2V
2 series · 2 of 2 positions shown · non-contrast
Comparison: 09/09/2016

CLINICAL DATA: Chest pain and shortness of breath

EXAM:
CHEST  2 VIEW

[w chest pa]
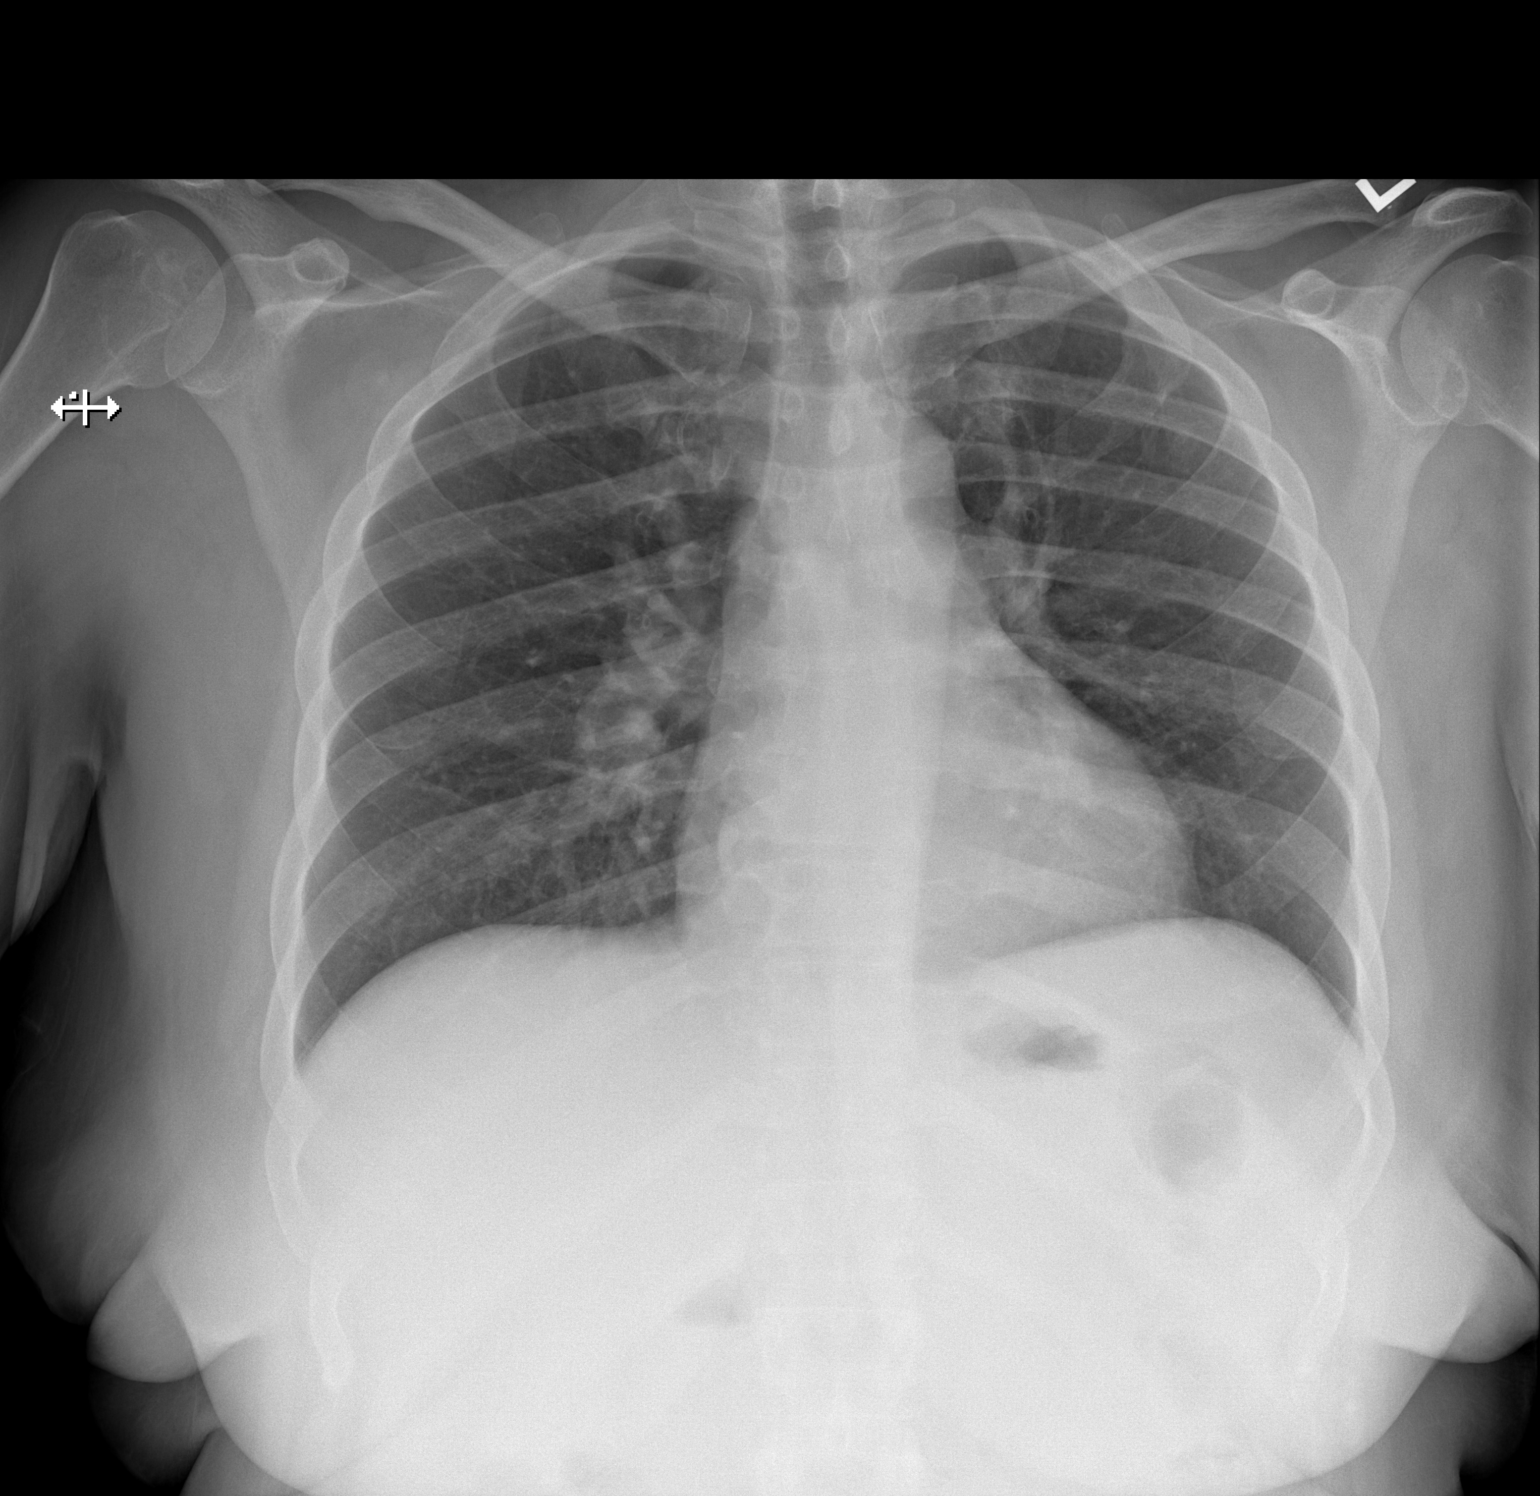

[w chest lat]
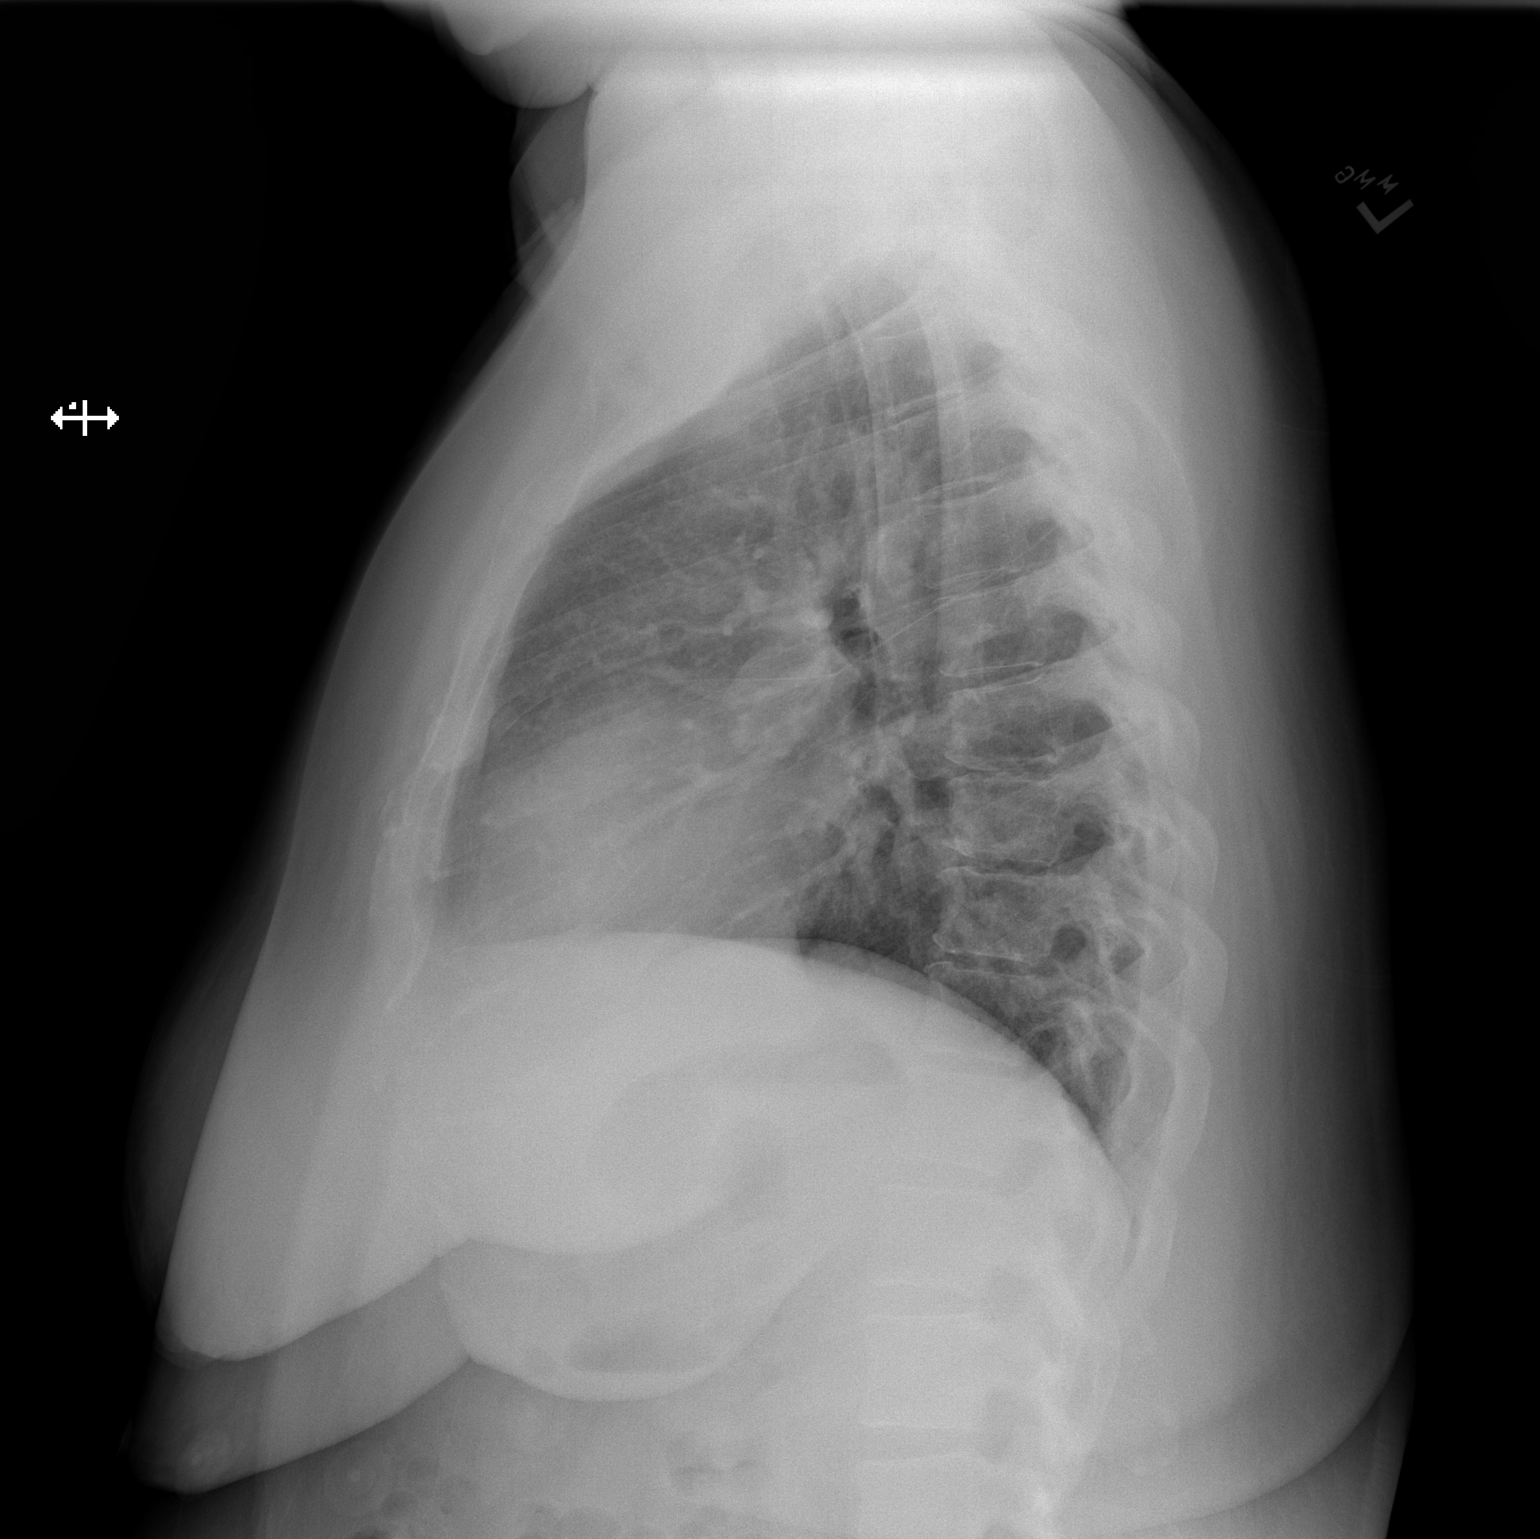

[2 of 2 positions shown; findings below may reference images not displayed]

FINDINGS: The heart size and mediastinal contours are within normal limits.
Both lungs are clear. The visualized skeletal structures are
unremarkable.
IMPRESSION: No active cardiopulmonary disease.

## 2019-06-10 IMAGING — CR DG CHEST 2V
2 series · 2 of 2 positions shown · non-contrast
Comparison: 10/30/2016, 05/29/2016

CLINICAL DATA: Chest pain with shortness of breath

EXAM:
CHEST  2 VIEW

[chest pa]
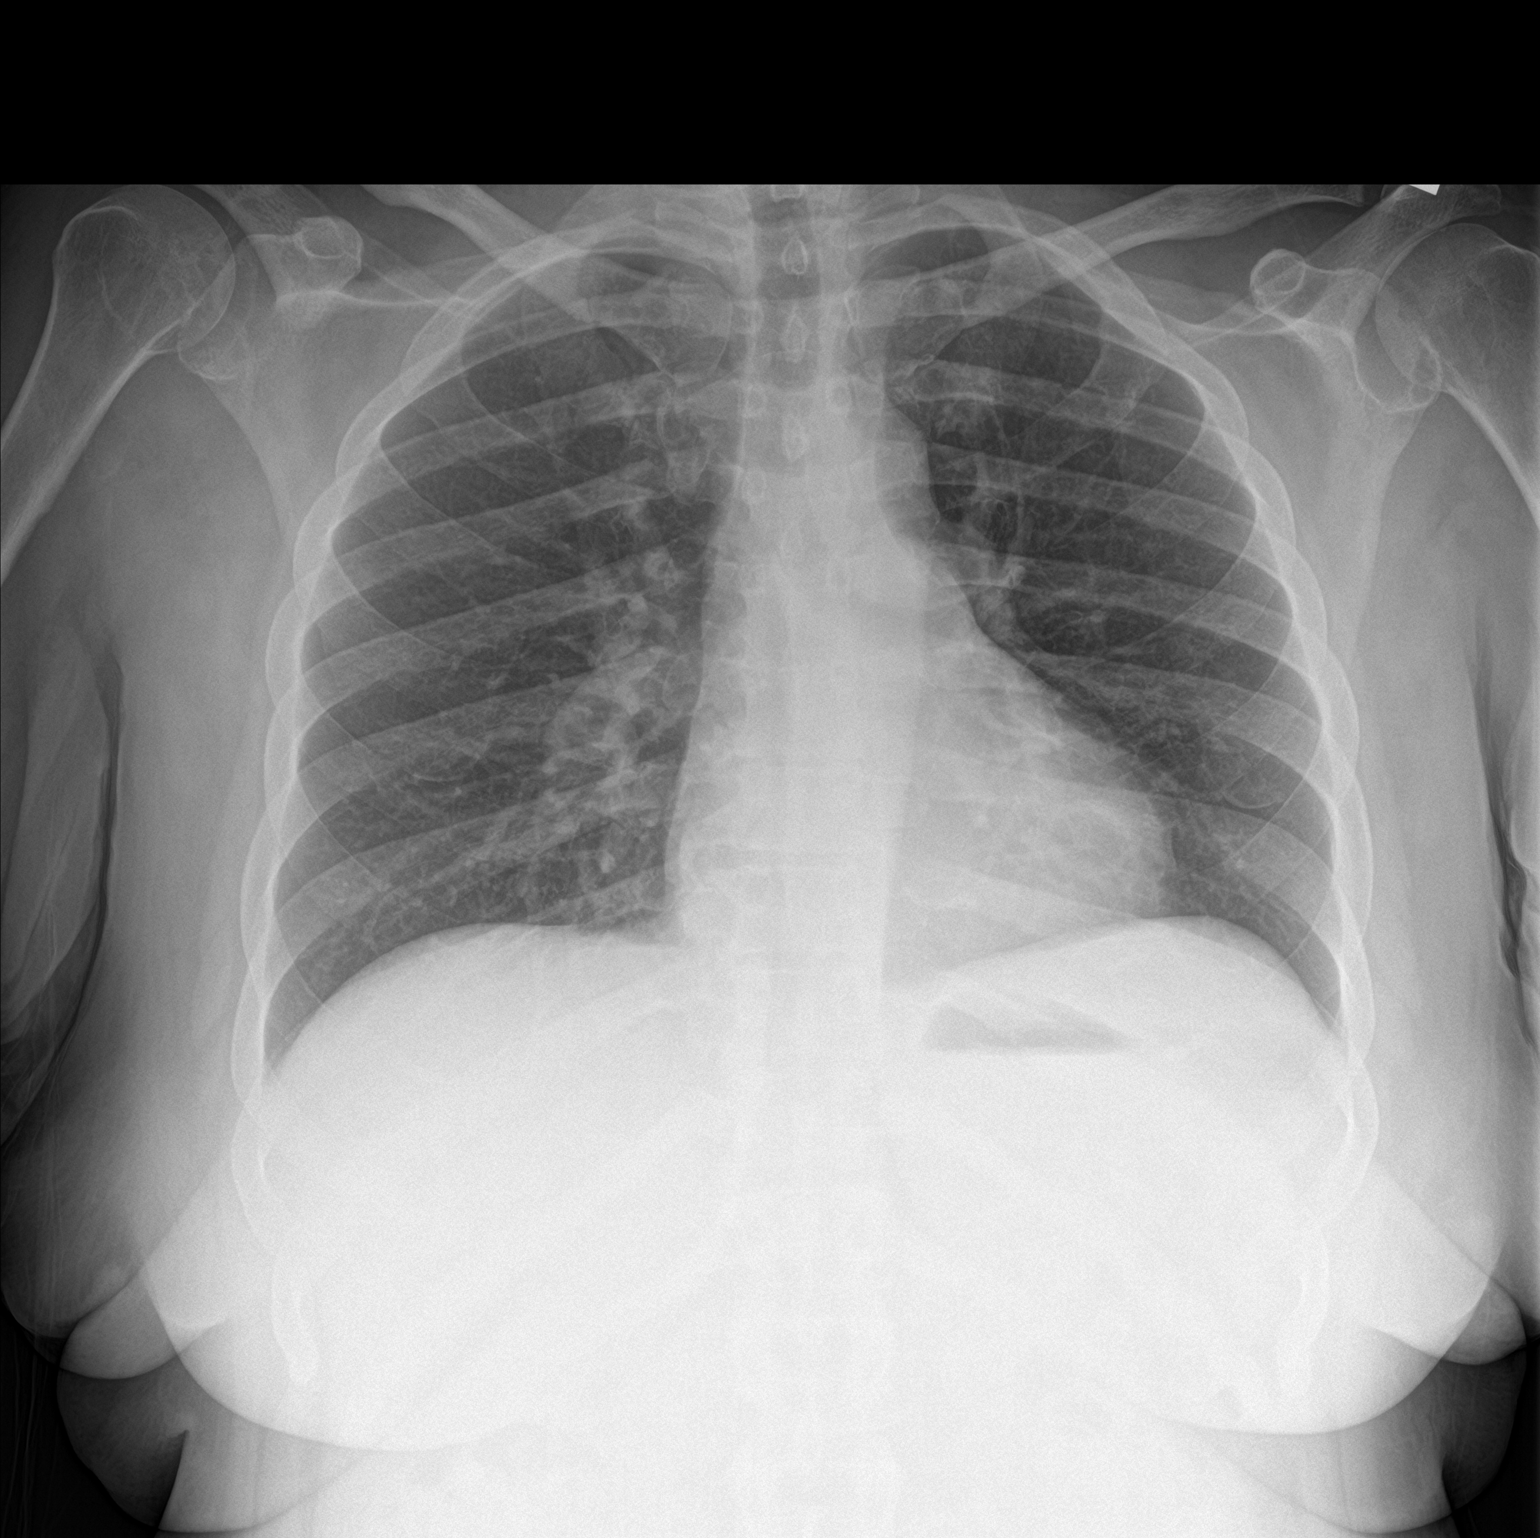

[chest lat]
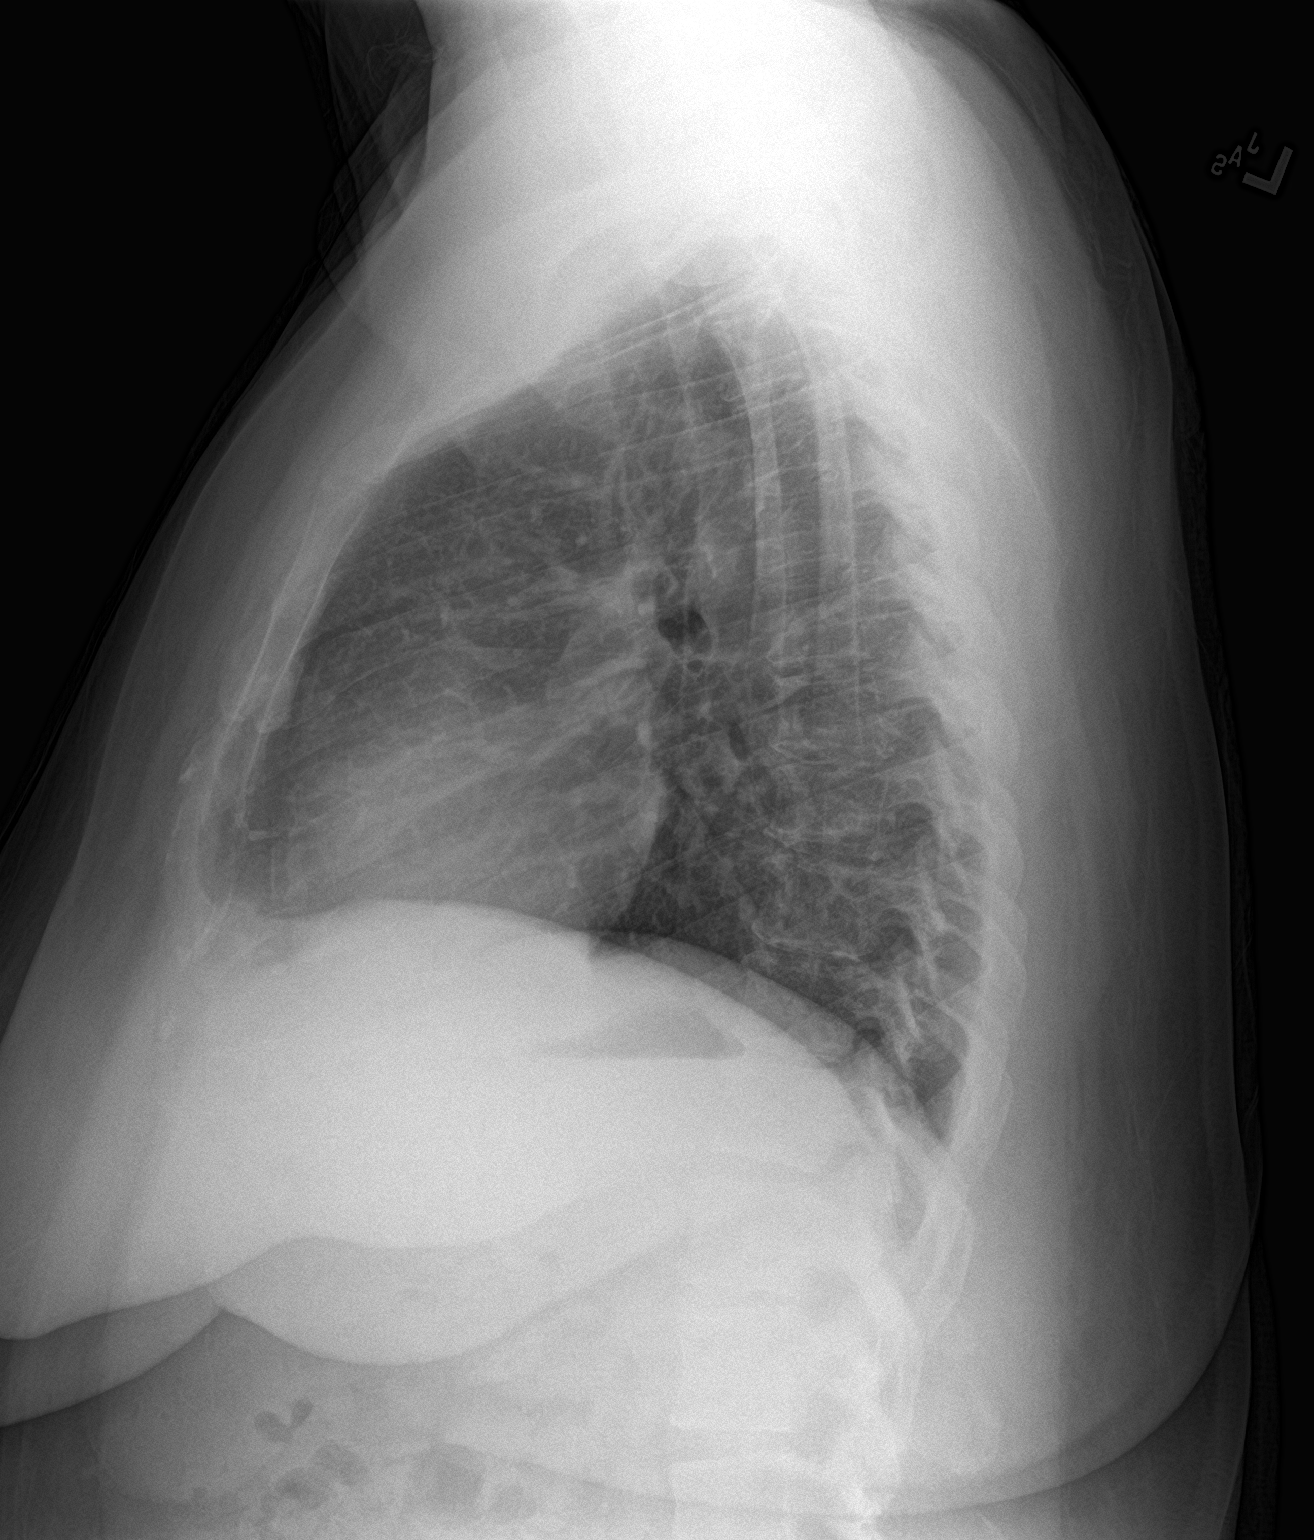

[2 of 2 positions shown; findings below may reference images not displayed]

FINDINGS: The heart size and mediastinal contours are within normal limits.
Both lungs are clear. The visualized skeletal structures are
unremarkable.
IMPRESSION: No active cardiopulmonary disease.

## 2019-06-11 IMAGING — CT CT RENAL STONE PROTOCOL
2 of 4 series · 17 of 46 positions shown, 19 images · non-contrast
Comparison: 10/24/2016

CLINICAL DATA: Recurrent shortness of breath and chest pain,
right-sided flank and abdominal pain

EXAM:
CT ABDOMEN AND PELVIS WITHOUT CONTRAST
TECHNIQUE: Multidetector CT imaging of the abdomen and pelvis was performed
following the standard protocol without IV contrast.

[Series 3: stone study 5.0 i30f 2 · axial · 0.73mm/px · z∈[+740,+1184]mm · 14 of 97 slices shown, 16 images]
[im 4/97  soft-tissue]
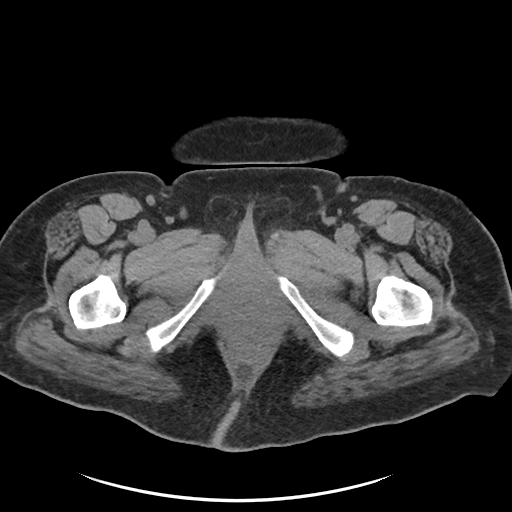
[im 4/97  bone]
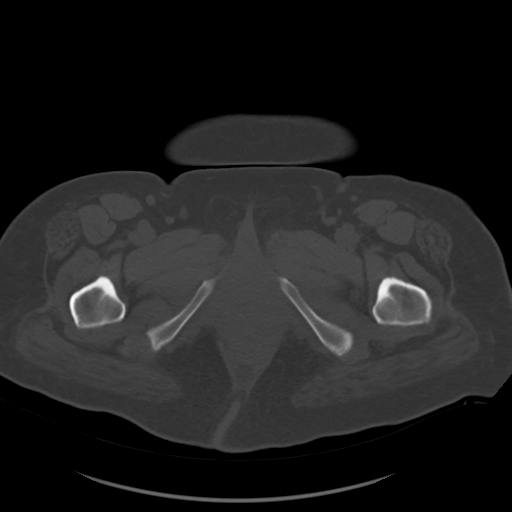
[im 12/97  soft-tissue]
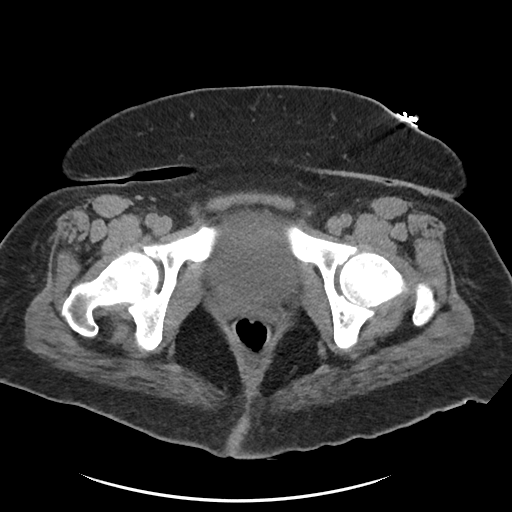
[im 19/97  soft-tissue]
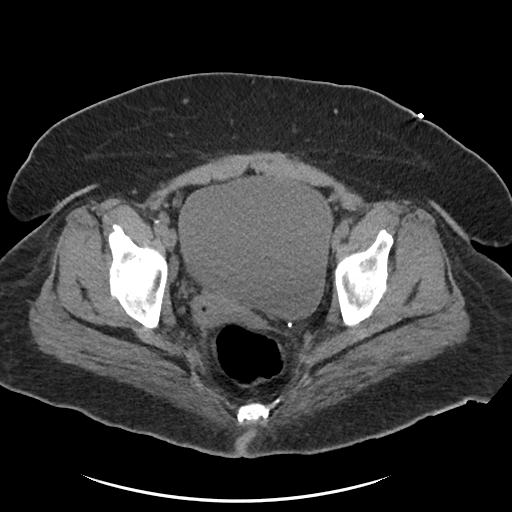
[im 26/97  soft-tissue]
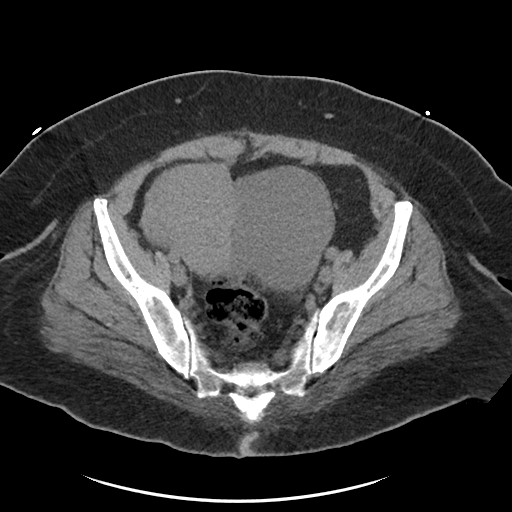
[im 34/97  soft-tissue]
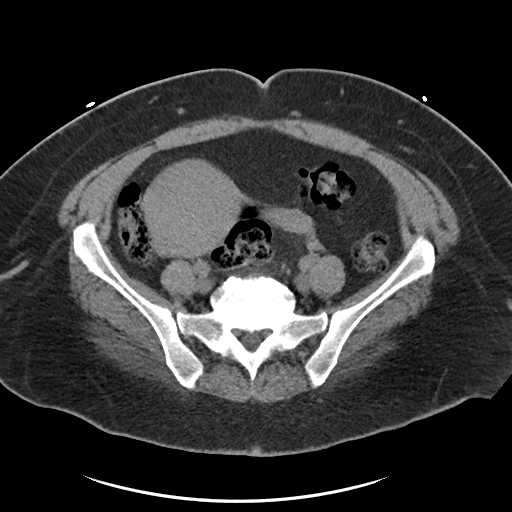
[im 37/97  soft-tissue]
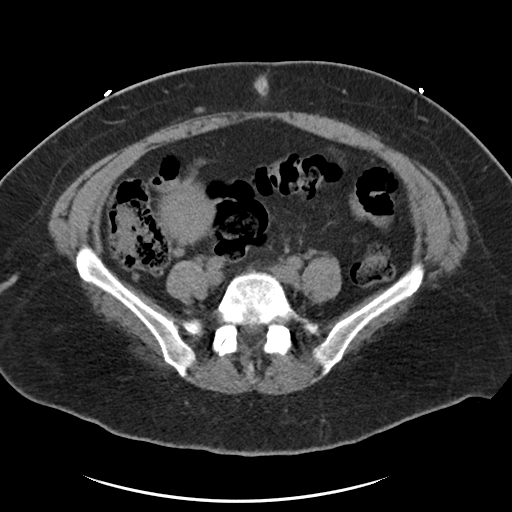
[im 45/97  soft-tissue]
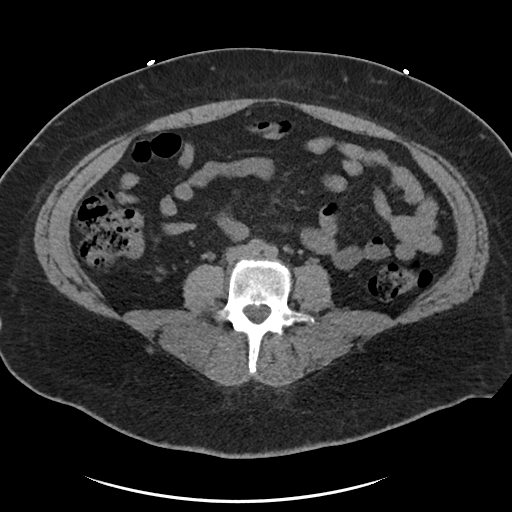
[im 52/97  soft-tissue]
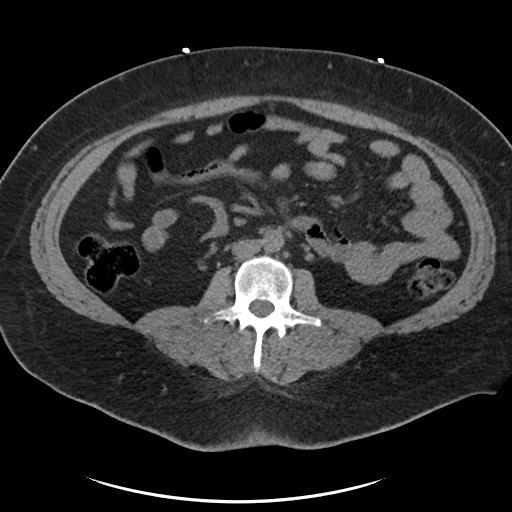
[im 60/97  soft-tissue]
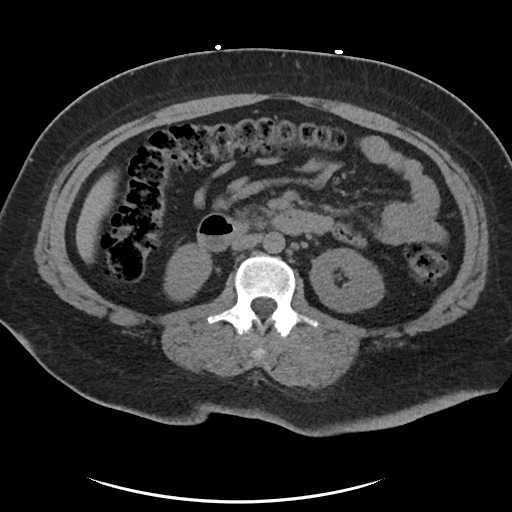
[im 60/97  bone]
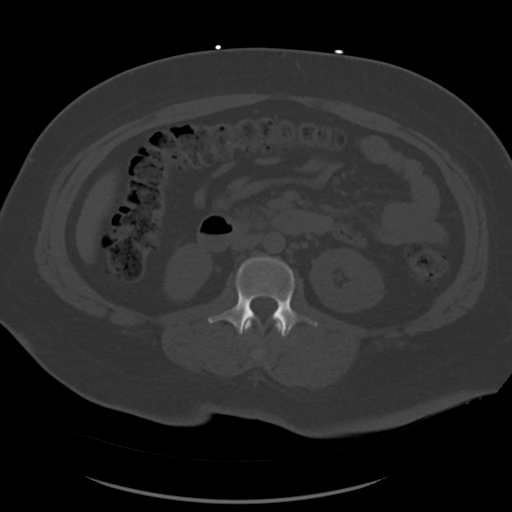
[im 63/97  soft-tissue]
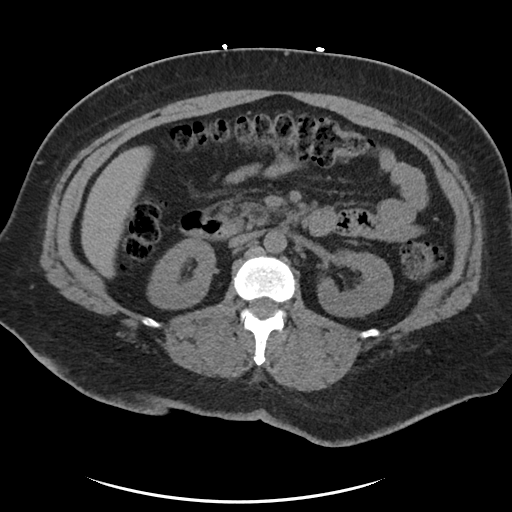
[im 71/97  soft-tissue]
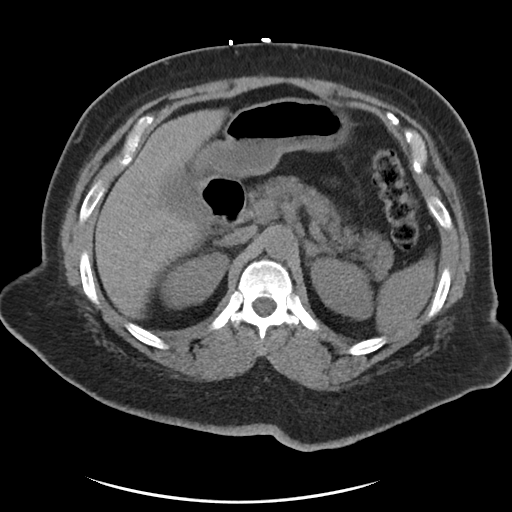
[im 78/97  soft-tissue]
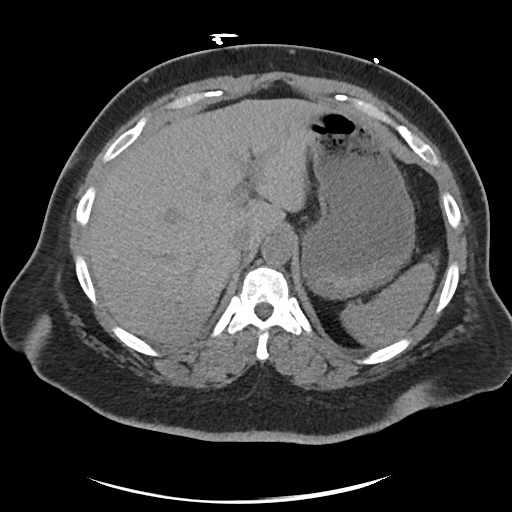
[im 85/97  soft-tissue]
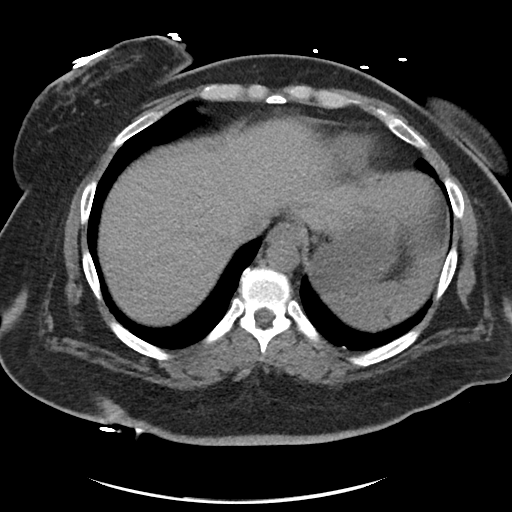
[im 93/97  soft-tissue]
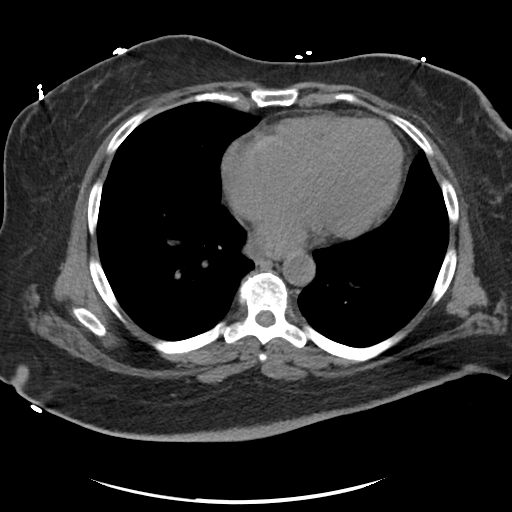

[Series 6: coronal soft tissue · coronal · 0.91mm/px · 3 of 94 slices shown]
[im 32/94  soft-tissue]
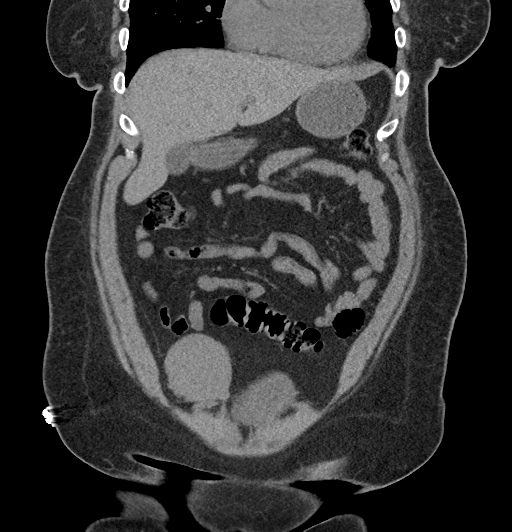
[im 42/94  soft-tissue]
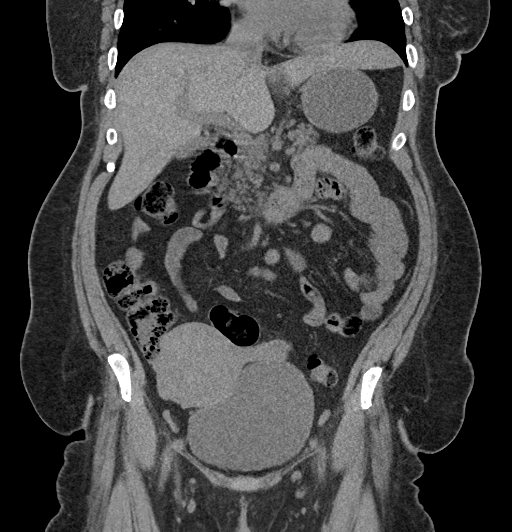
[im 52/94  soft-tissue]
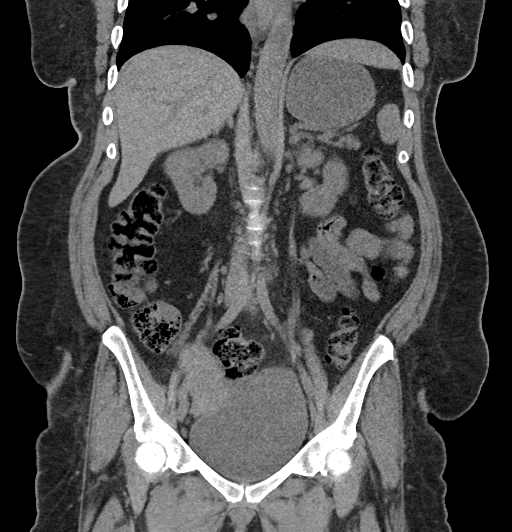

[17 of 46 positions shown; findings below may reference images not displayed]

FINDINGS: Lower chest: Lung bases demonstrate no acute consolidation or
pleural effusion. Borderline heart size.

Hepatobiliary: No focal liver abnormality is seen. No gallstones,
gallbladder wall thickening, or biliary dilatation.

Pancreas: Unremarkable. No pancreatic ductal dilatation or
surrounding inflammatory changes.

Spleen: Normal in size without focal abnormality.

Adrenals/Urinary Tract: Adrenal glands are unremarkable. Kidneys are
normal, without renal calculi, focal lesion, or hydronephrosis.
Bladder is unremarkable.

Stomach/Bowel: Stomach is within normal limits. Appendix appears
normal. No evidence of bowel wall thickening, distention, or
inflammatory changes.

Vascular/Lymphatic: No significant vascular findings are present. No
enlarged abdominal or pelvic lymph nodes.

Reproductive: Enlarged globular appearing uterus similar compared to
prior. No adnexal masses.

Other: No free air or free fluid.

Musculoskeletal: Degenerative changes of the spine. No acute or
suspicious bone lesion.
IMPRESSION: 1. Negative for nephrolithiasis or hydronephrosis. Negative for
ureteral stone.
2. Negative appendix
3. Prominent globular appearing uterus as before.

## 2019-06-13 IMAGING — CT CT ANGIO CHEST
3 of 9 series · 18 of 36 positions shown · IV contrast (OMNI)
Comparison: Chest radiograph dated 11/07/2016 and chest CT dated
10/30/2016

CLINICAL DATA: 41-year-old female with shortness of breath. History
of pulmonary emboli.

EXAM:
CT ANGIOGRAPHY CHEST WITH CONTRAST
TECHNIQUE: Multidetector CT imaging of the chest was performed using the
standard protocol during bolus administration of intravenous
contrast. Multiplanar CT image reconstructions and MIPs were
obtained to evaluate the vascular anatomy.
CONTRAST:  100 cc Isovue 370

[Series 6: thins · axial · 0.70mm/px · z∈[+1103,+1353]mm · 15 of 286 slices shown]
[im 18/286  lung]
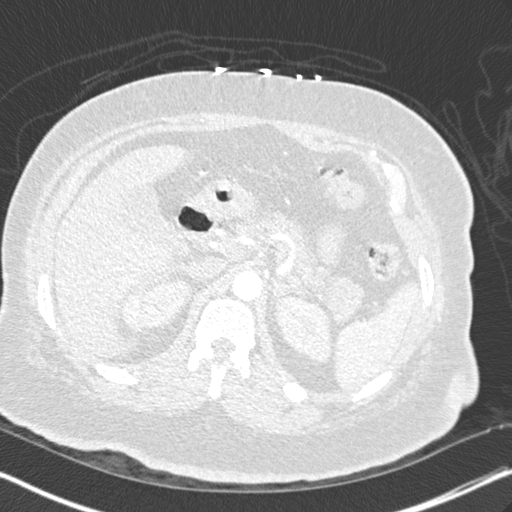
[im 36/286  mediastinal]
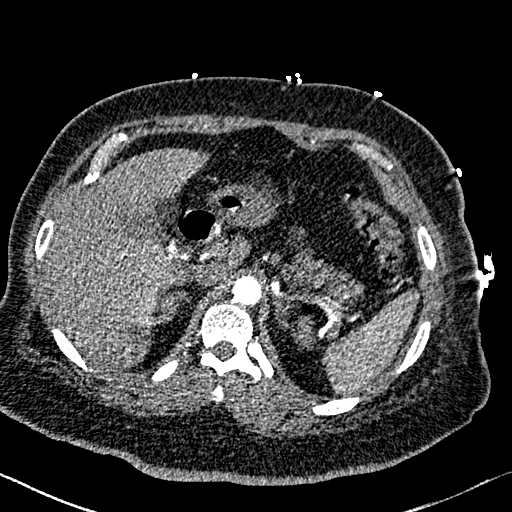
[im 54/286  lung]
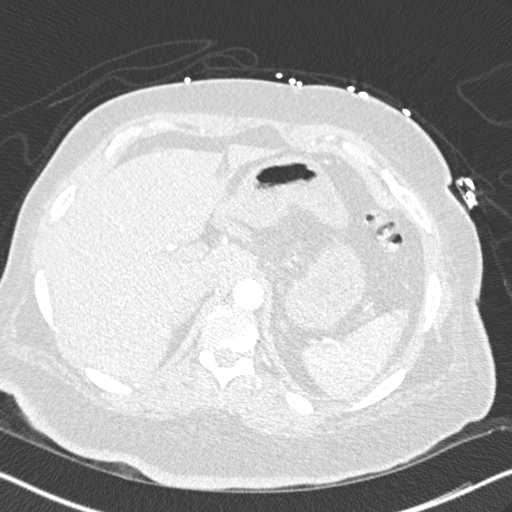
[im 72/286  mediastinal]
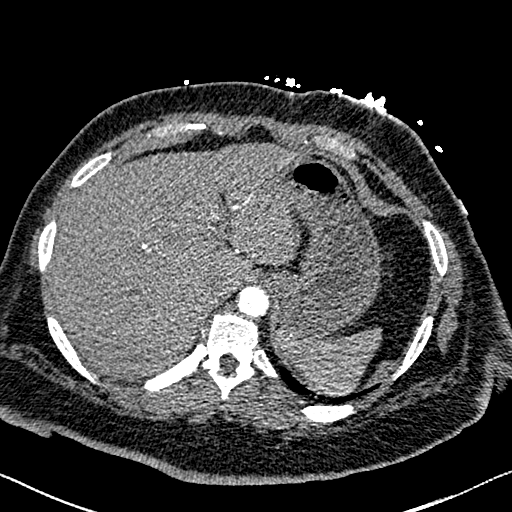
[im 90/286  lung]
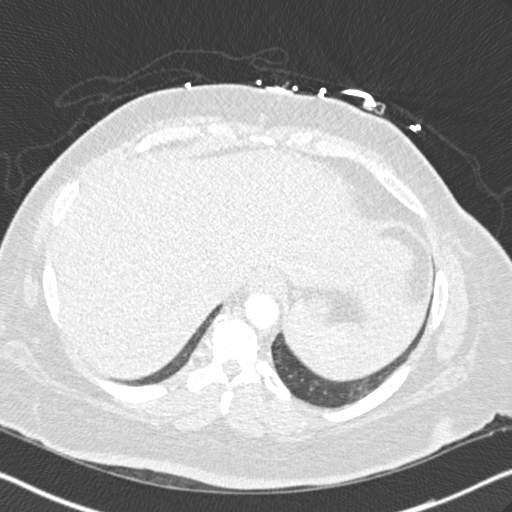
[im 107/286  mediastinal]
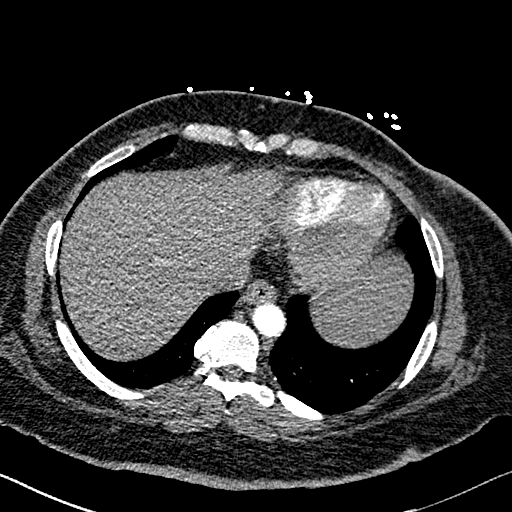
[im 125/286  lung]
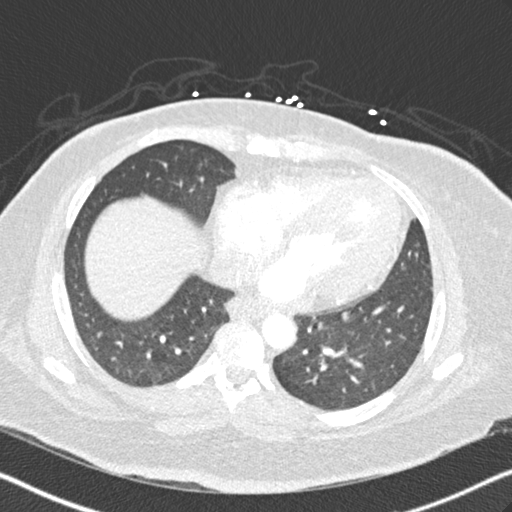
[im 143/286  mediastinal]
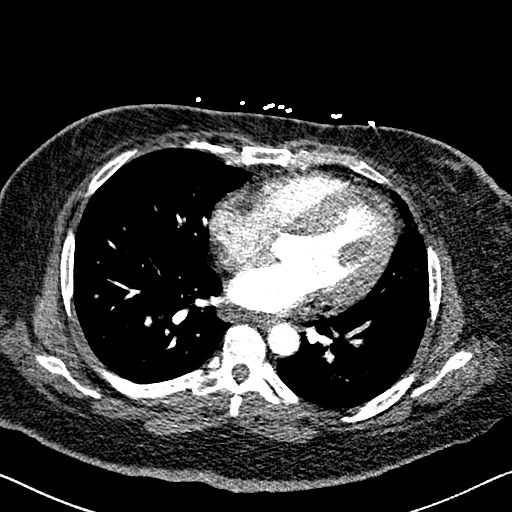
[im 161/286  lung]
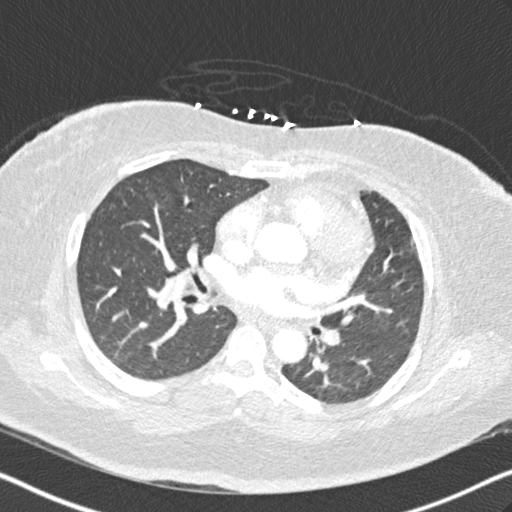
[im 179/286  mediastinal]
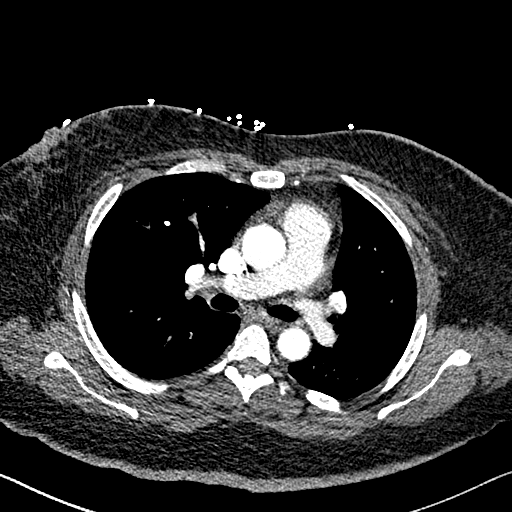
[im 196/286  lung]
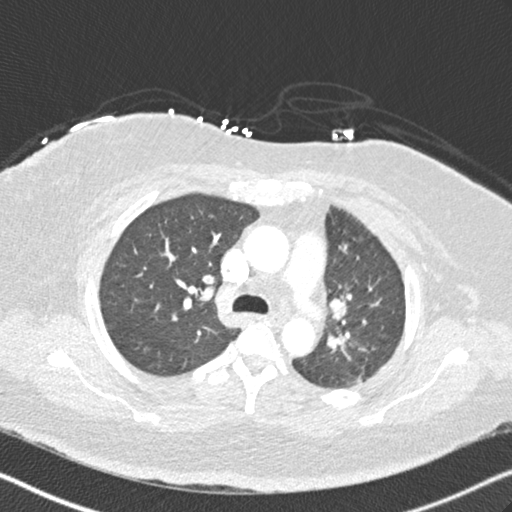
[im 214/286  mediastinal]
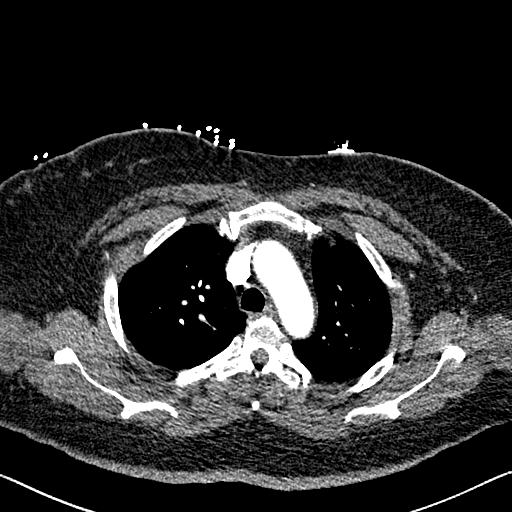
[im 232/286  lung]
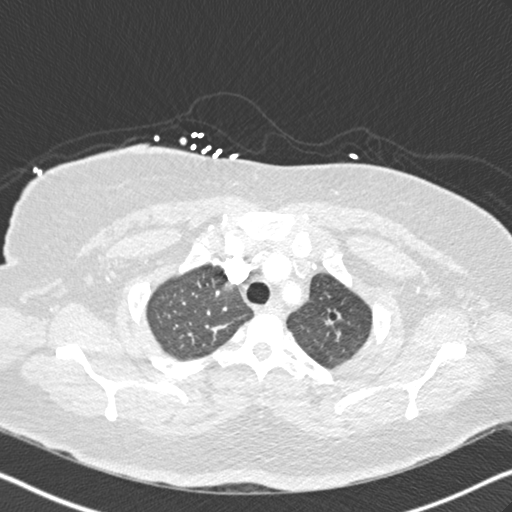
[im 250/286  mediastinal]
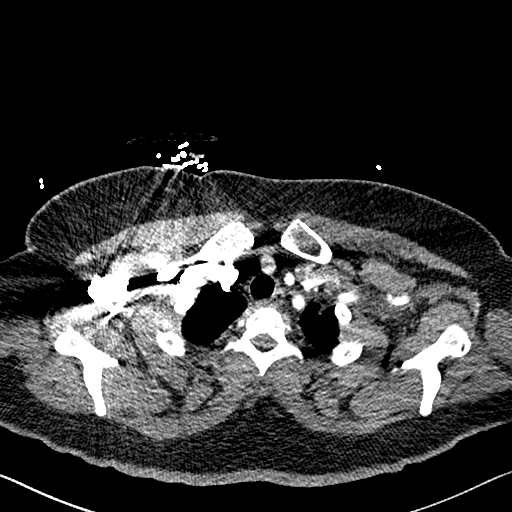
[im 268/286  lung]
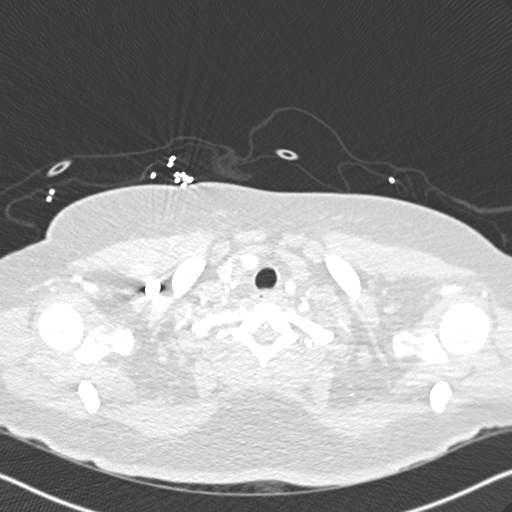

[Series 7: lung · axial · 0.70mm/px · z∈[+1227,+1299]mm · 2 of 73 slices shown]
[im 25/73  mediastinal]
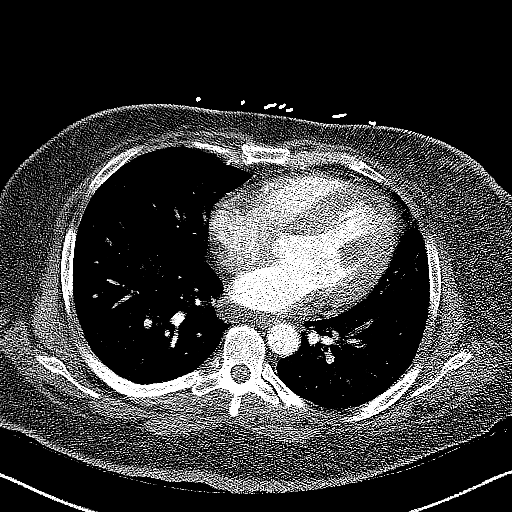
[im 49/73  mediastinal]
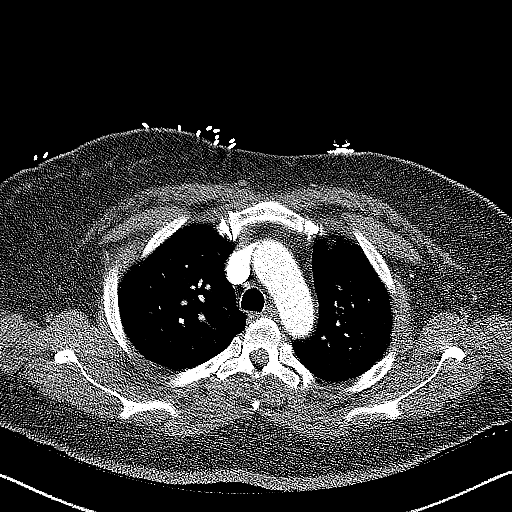

[Series 8: coronal mpr · coronal · 0.59mm/px · 1 of 155 slices shown]
[im 78/155  mediastinal]
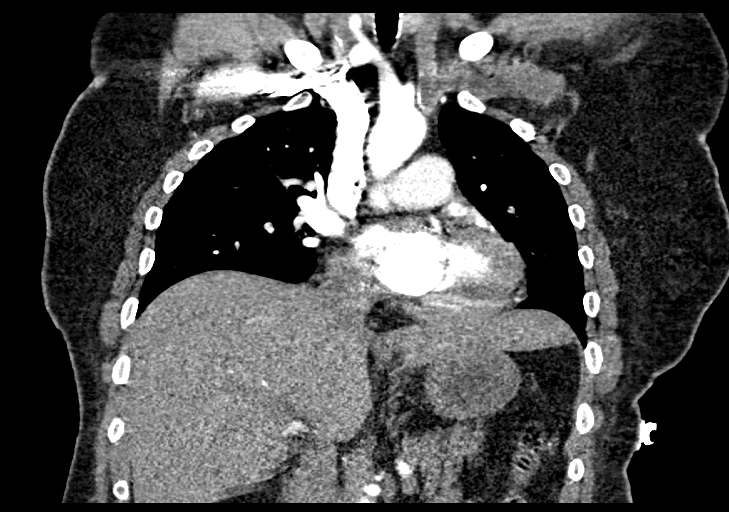

[18 of 36 positions shown; findings below may reference images not displayed]

FINDINGS: Cardiovascular: There is no cardiomegaly or pericardial effusion.
The thoracic aorta is unremarkable. The origins of the great vessels
of the aortic arch appear patent. Evaluation of the pulmonary artery
is limited due to suboptimal opacification and timing of the
contrast. Bilateral pulmonary emboli again noted which appears
stable and similar to the prior CT.

Mediastinum/Nodes: There is no hilar or mediastinal adenopathy. The
esophagus and the thyroid gland are grossly unremarkable.

Lungs/Pleura: Left upper lobe cylindrical bronchiectatic changes and
scarring as seen on the prior CT. There is no focal consolidation,
pleural effusion, or pneumothorax. The central airways are patent.

Upper Abdomen: No acute abnormality.

Musculoskeletal: No chest wall abnormality. No acute or significant
osseous findings.

Review of the MIP images confirms the above findings.
IMPRESSION: Bilateral pulmonary artery emboli similar to prior CT and with no
significant interval change. Evaluation of the pulmonary arteries is
limited due to suboptimal opacification and timing of the contrast.
No new intrathoracic pathology identified.

## 2019-06-13 IMAGING — CR DG CHEST 2V
2 series · 2 of 2 positions shown · non-contrast
Comparison: 11/04/2016 CXR

CLINICAL DATA: Right upper chest pain and dyspnea tonight. Nausea
and vomiting.

EXAM:
CHEST  2 VIEW

[chest pa]
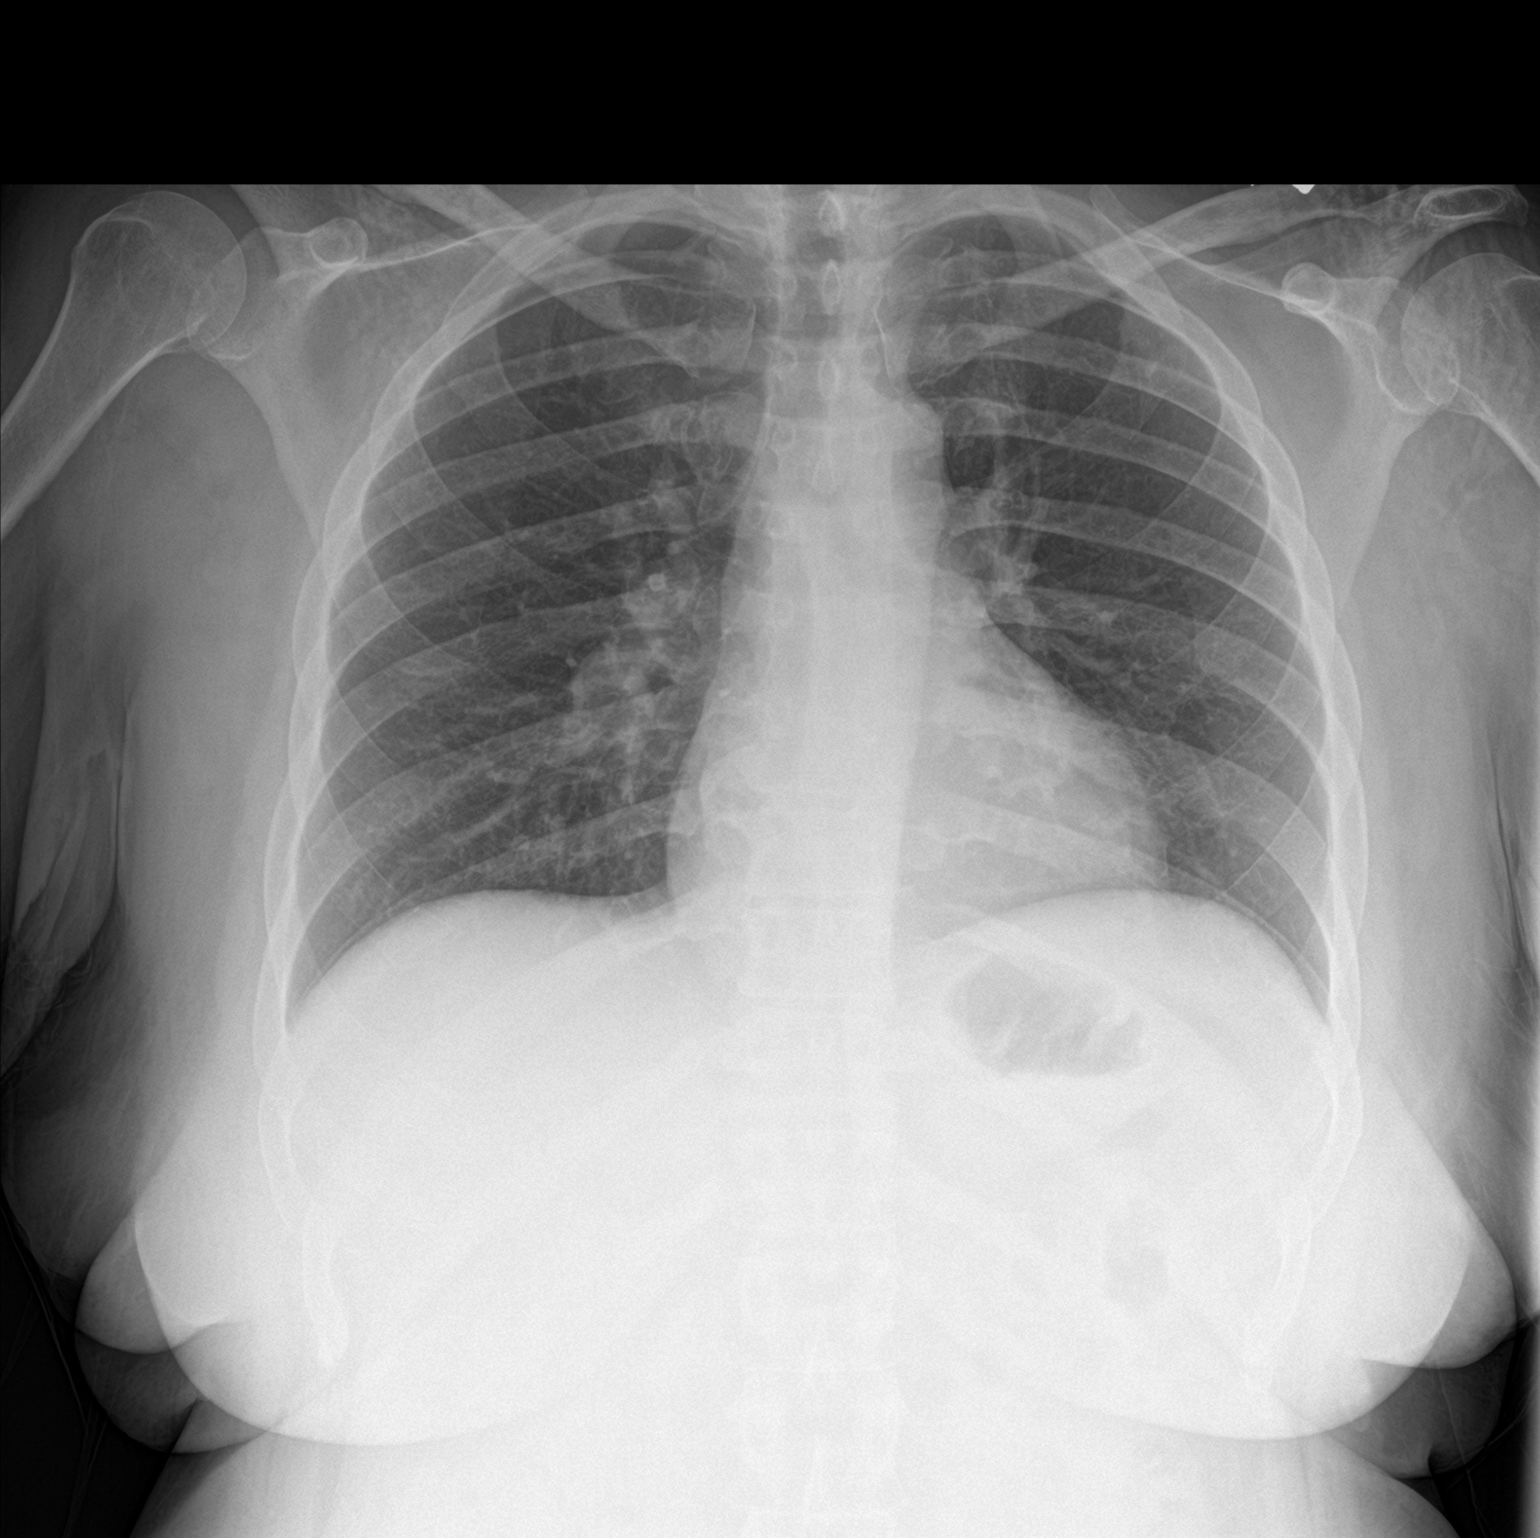

[chest lat]
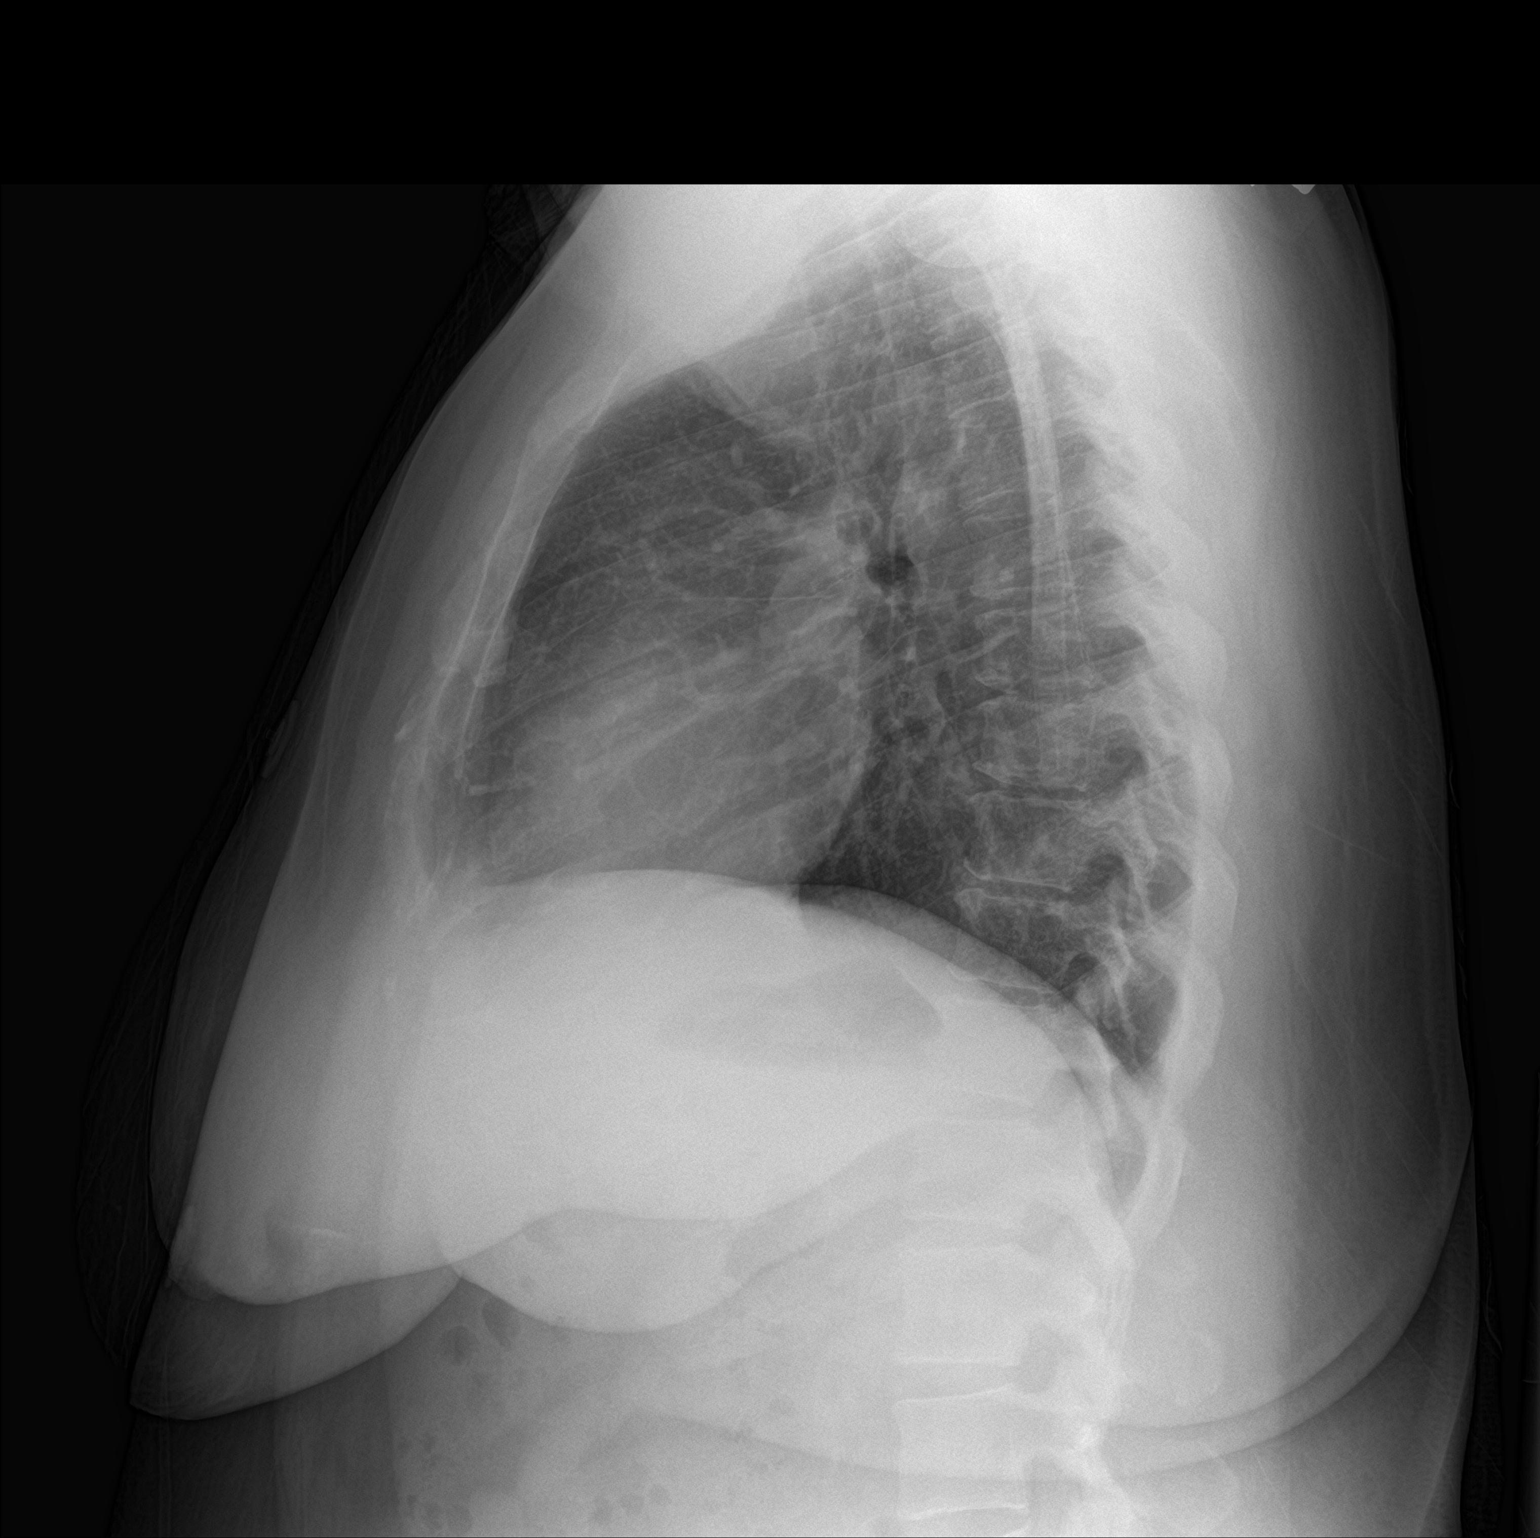

[2 of 2 positions shown; findings below may reference images not displayed]

FINDINGS: The heart size and mediastinal contours are within normal limits.
Both lungs are clear. The visualized skeletal structures are
unremarkable. Braid artifacts project over both shoulders.
IMPRESSION: No active cardiopulmonary disease.

## 2019-07-04 IMAGING — DX DG CHEST 2V
2 series · 2 of 2 positions shown · non-contrast
Comparison: Chest radiograph 11/11/2016

CLINICAL DATA: Chest pain

EXAM:
CHEST  2 VIEW

[chest pa]
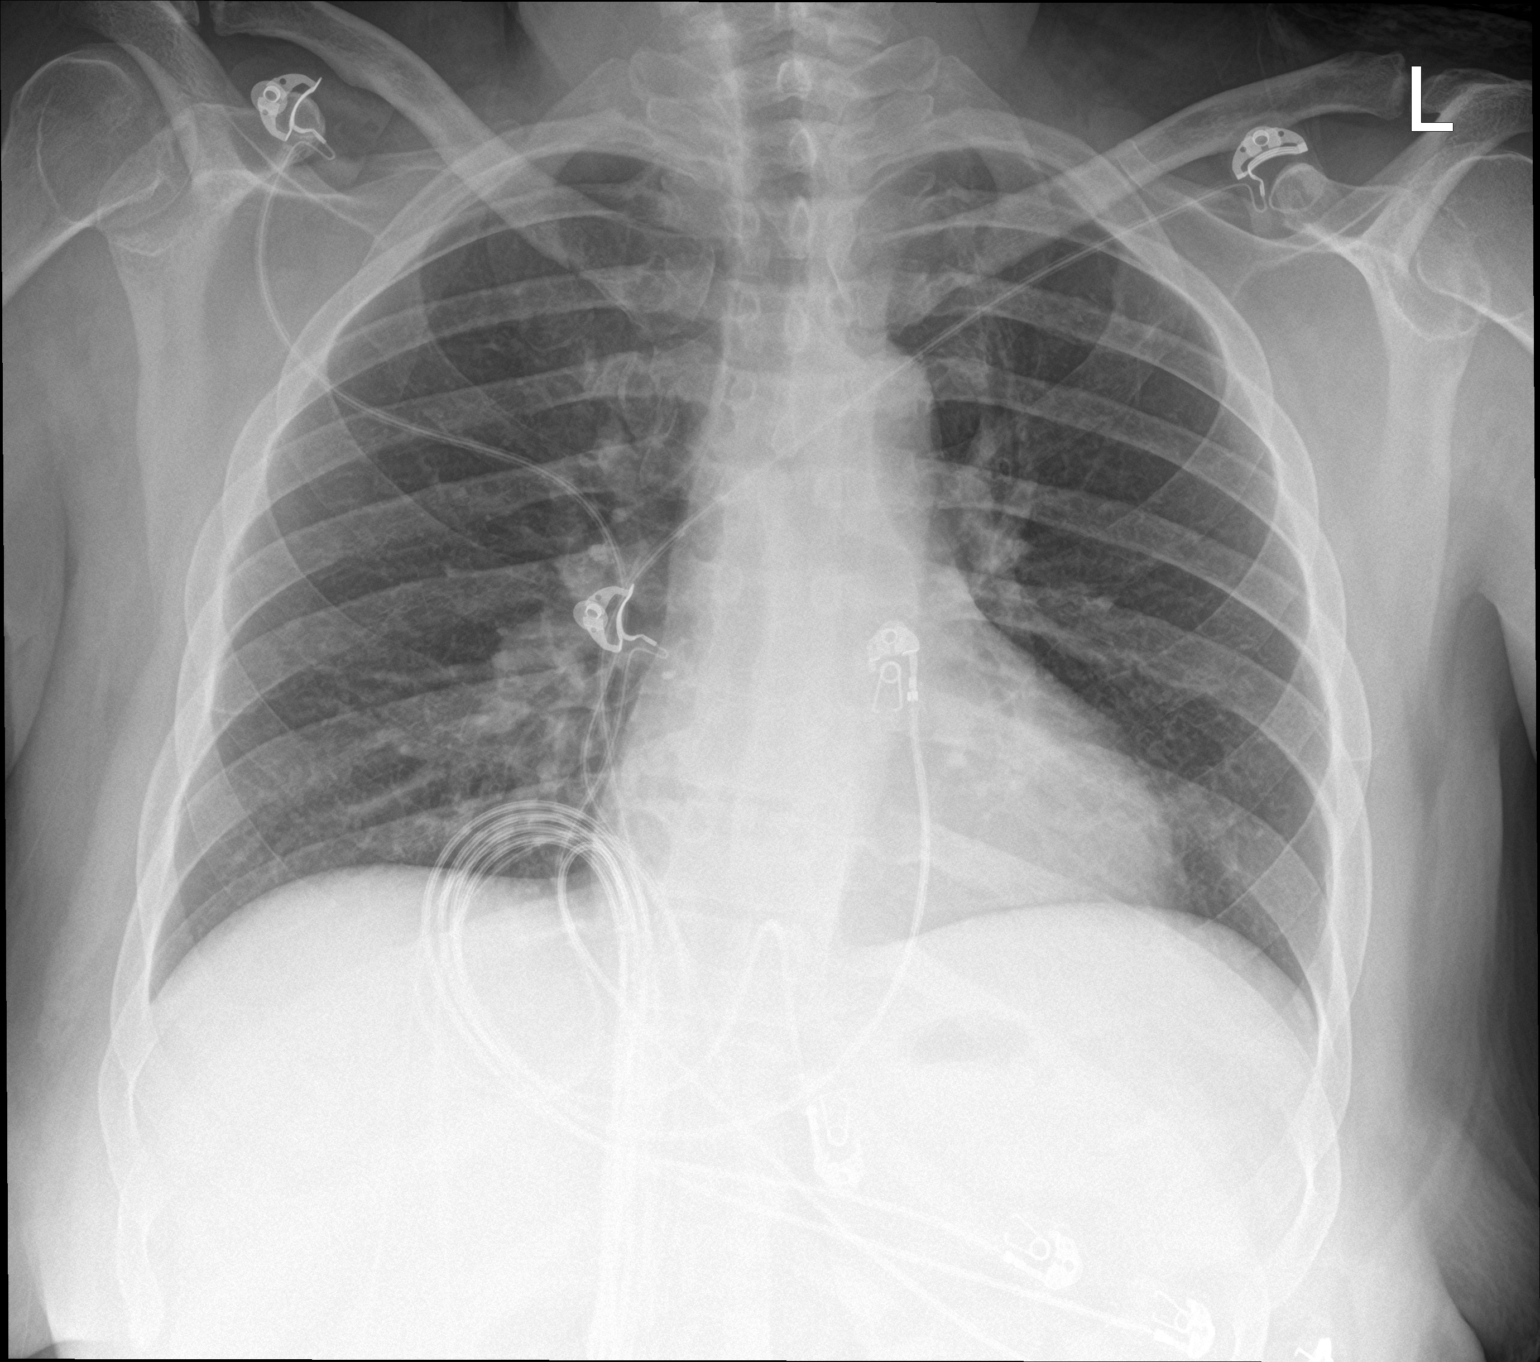

[chest lat]
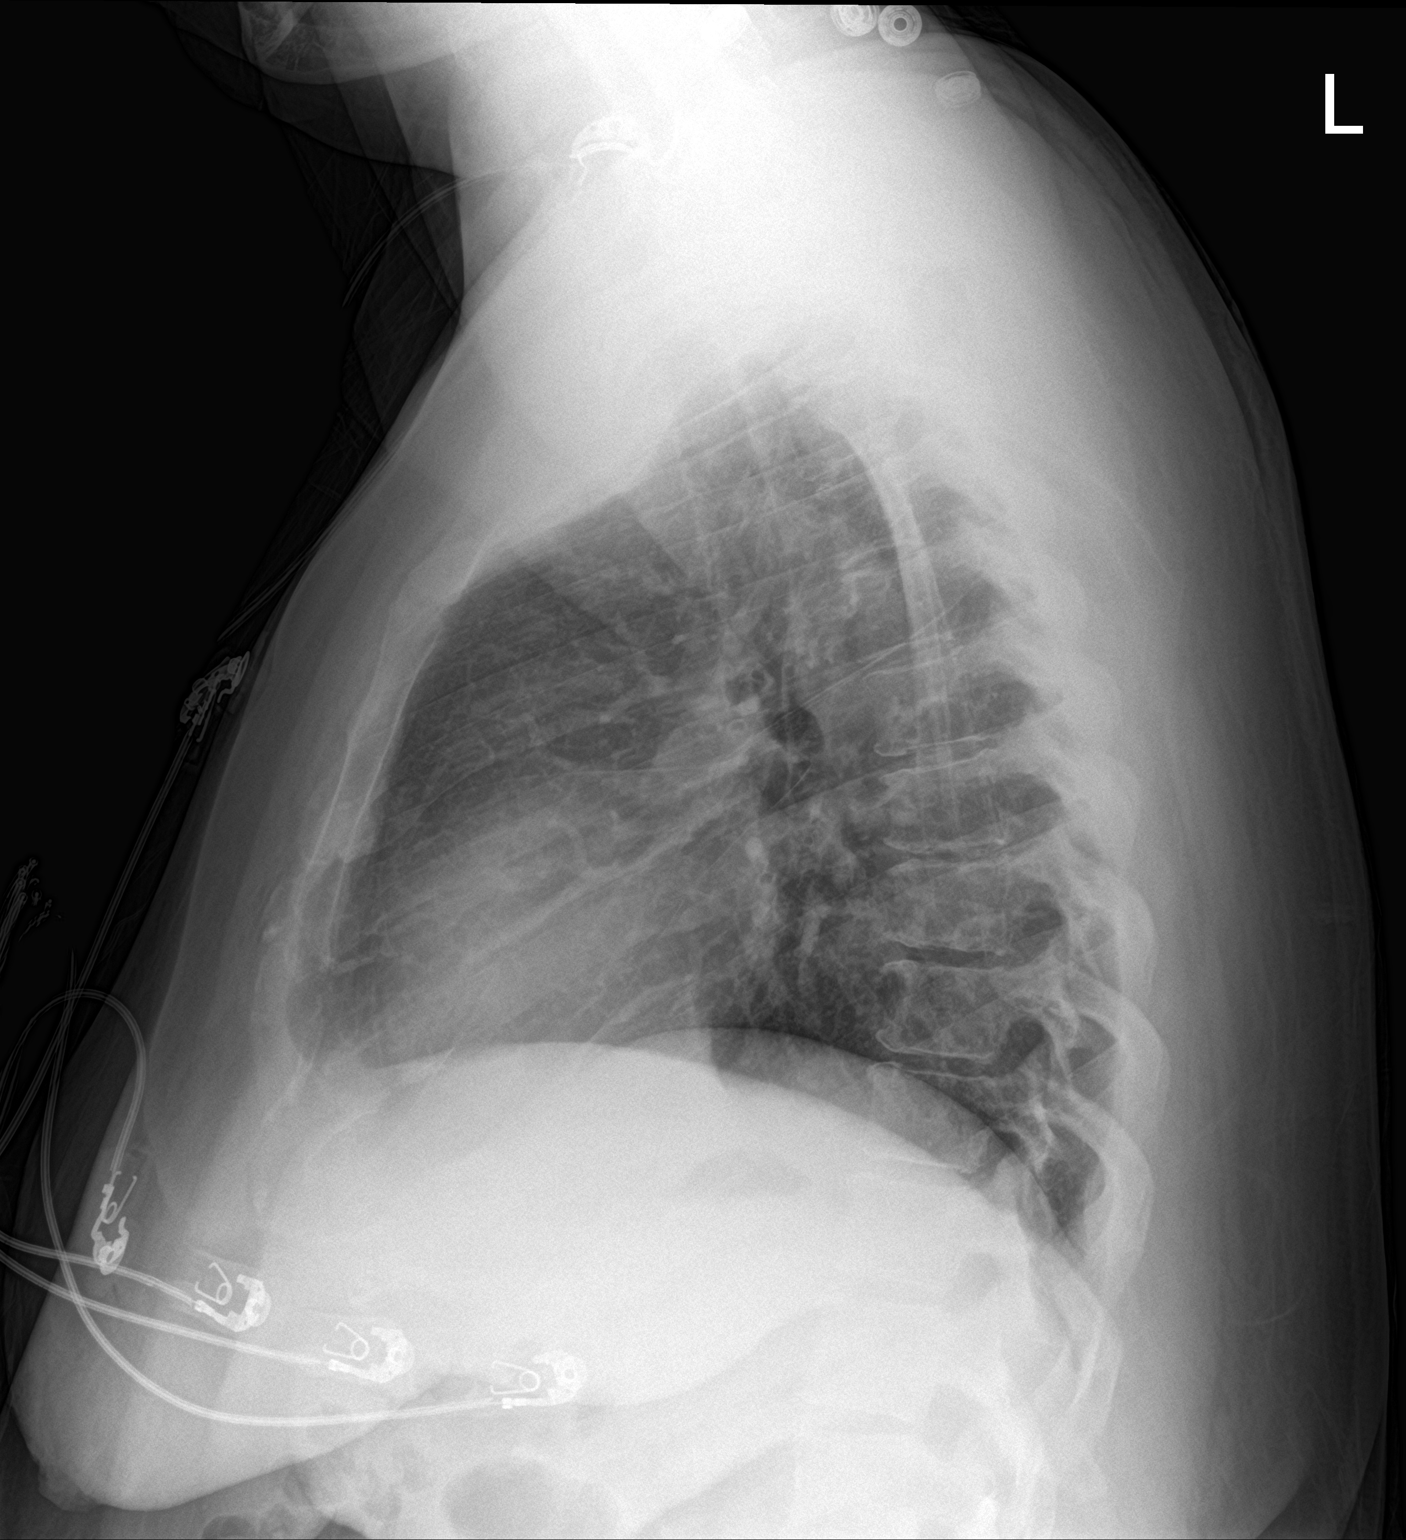

[2 of 2 positions shown; findings below may reference images not displayed]

FINDINGS: The heart size and mediastinal contours are within normal limits.
Both lungs are clear. The visualized skeletal structures are
unremarkable.
IMPRESSION: No active cardiopulmonary disease.

## 2019-07-05 IMAGING — CT CT ABD-PELV W/ CM
2 of 5 series · 16 of 46 positions shown, 18 images · IV contrast (iopamidol)
Comparison: CT of the abdomen and pelvis from 11/05/2016, and
pelvic ultrasound performed 11/07/2016

CLINICAL DATA: Acute onset of generalized abdominal pain, nausea
and vomiting. Initial encounter.

EXAM:
CT ABDOMEN AND PELVIS WITH CONTRAST
TECHNIQUE: Multidetector CT imaging of the abdomen and pelvis was performed
using the standard protocol following bolus administration of
intravenous contrast.
CONTRAST:  100mL GM7A5M-BGG IOPAMIDOL (GM7A5M-BGG) INJECTION 61%

[Series 3: a/p w/ 5mm · axial · 0.93mm/px · z∈[-562,-142]mm · 13 of 96 slices shown, 15 images]
[im 6/96  soft-tissue]
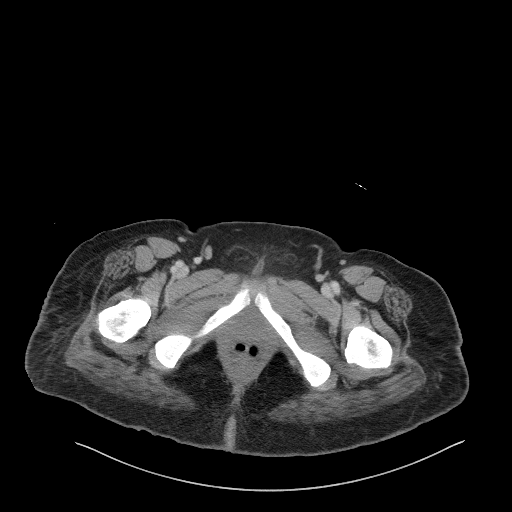
[im 6/96  bone]
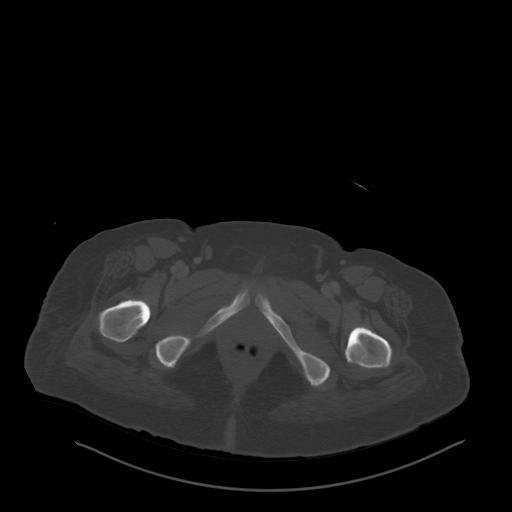
[im 12/96  soft-tissue]
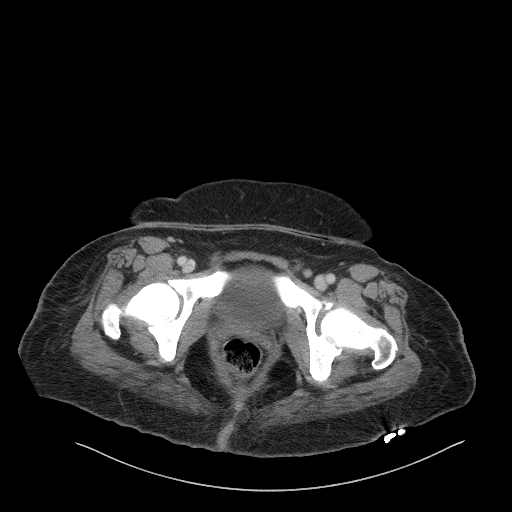
[im 23/96  soft-tissue]
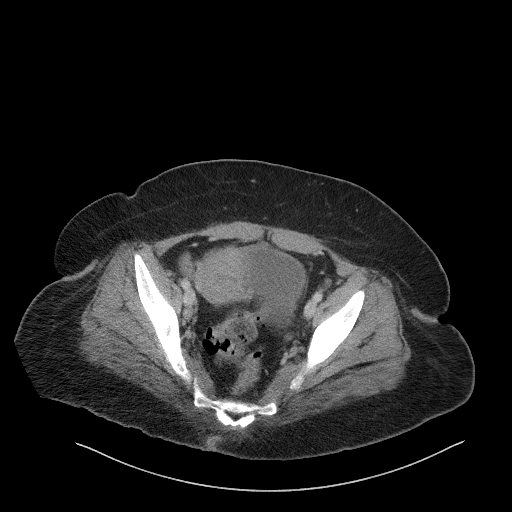
[im 28/96  soft-tissue]
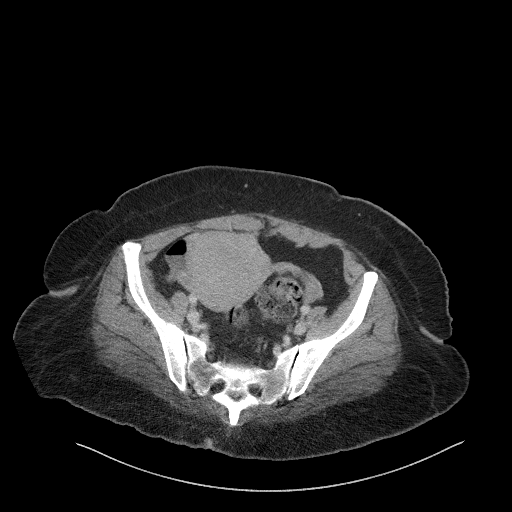
[im 34/96  soft-tissue]
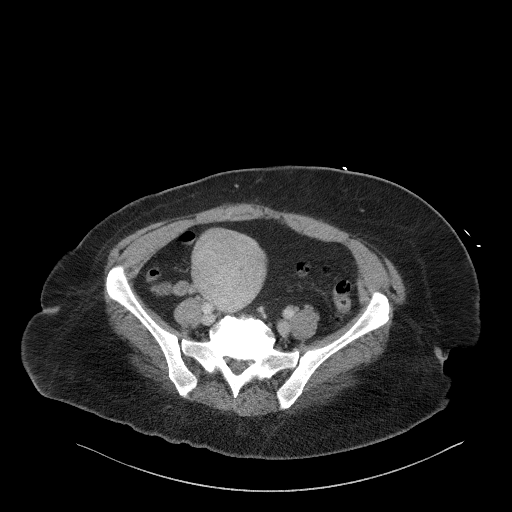
[im 40/96  soft-tissue]
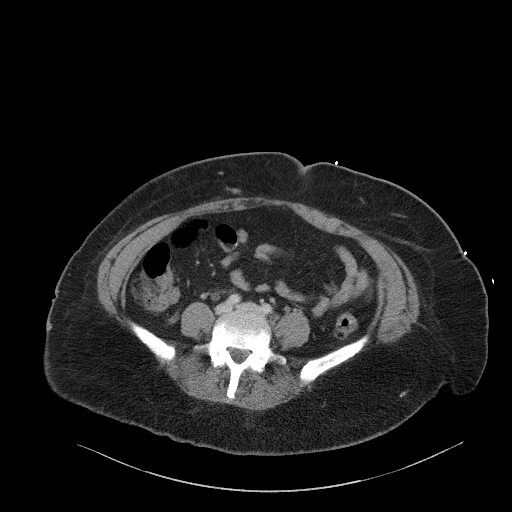
[im 51/96  soft-tissue]
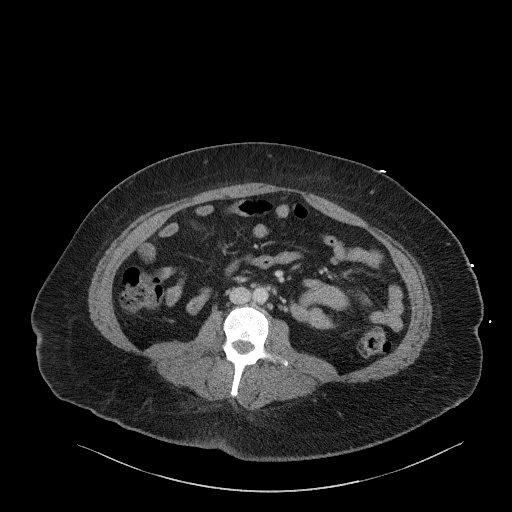
[im 56/96  soft-tissue]
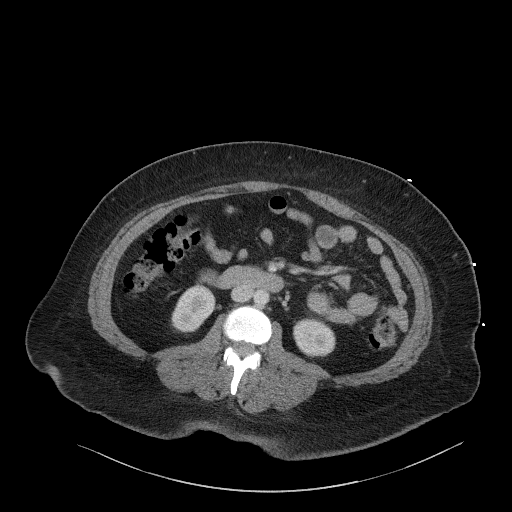
[im 62/96  soft-tissue]
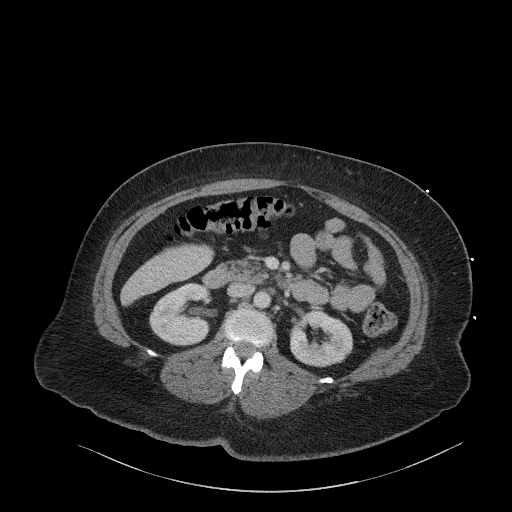
[im 62/96  bone]
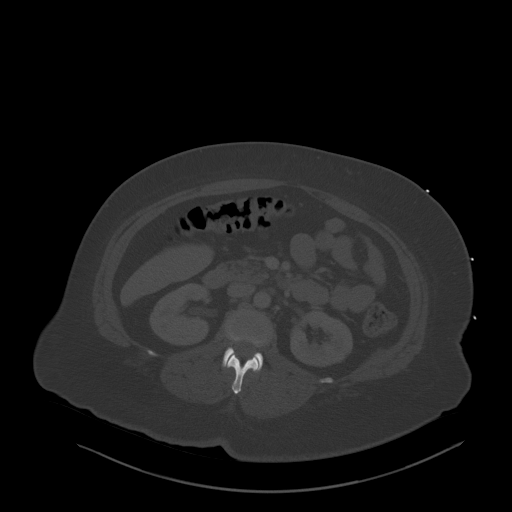
[im 68/96  soft-tissue]
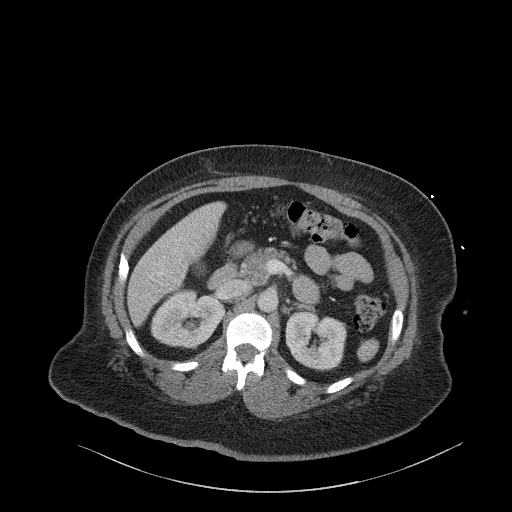
[im 73/96  soft-tissue]
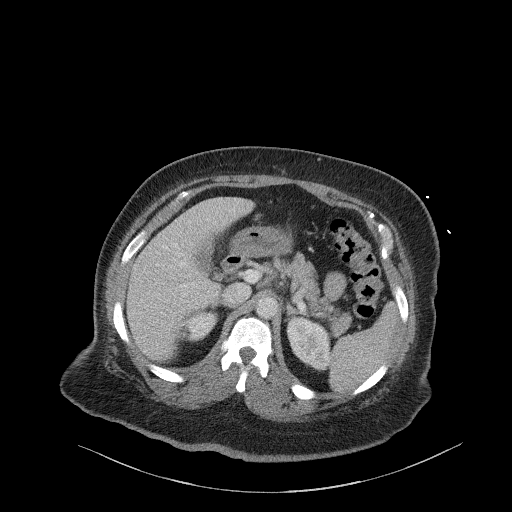
[im 84/96  soft-tissue]
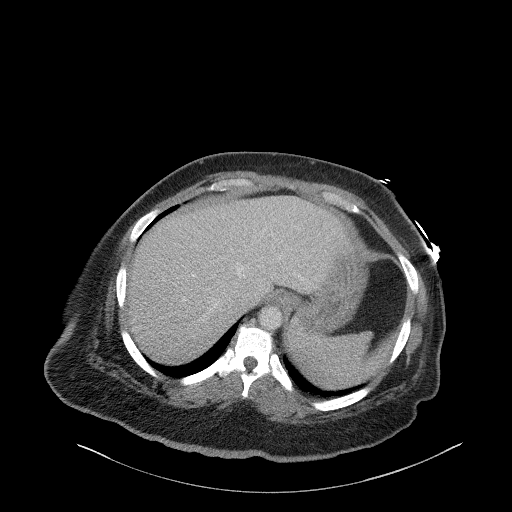
[im 90/96  soft-tissue]
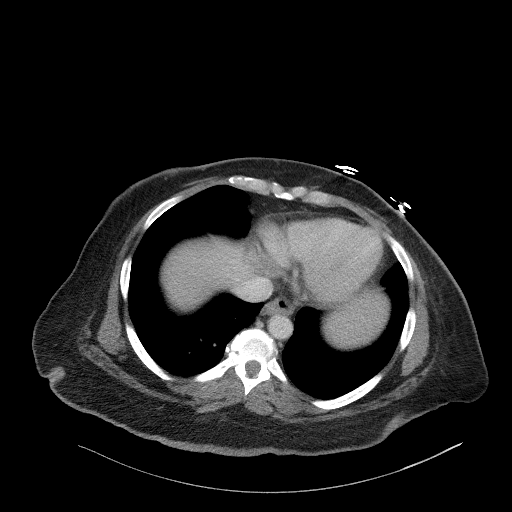

[Series 6: a/p w/ cor · coronal · 0.82mm/px · 3 of 126 slices shown]
[im 42/126  soft-tissue]
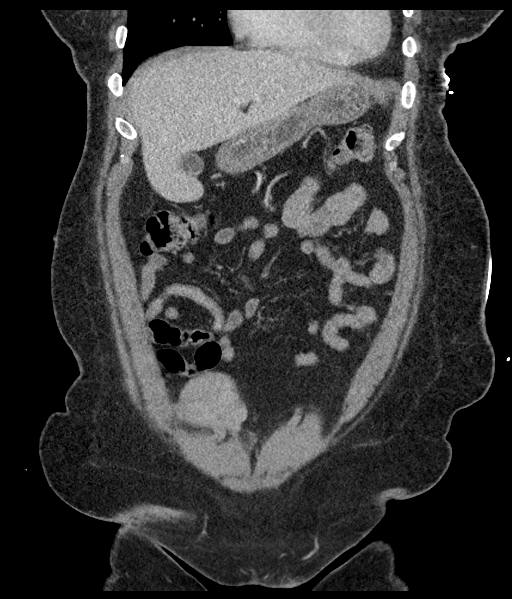
[im 56/126  soft-tissue]
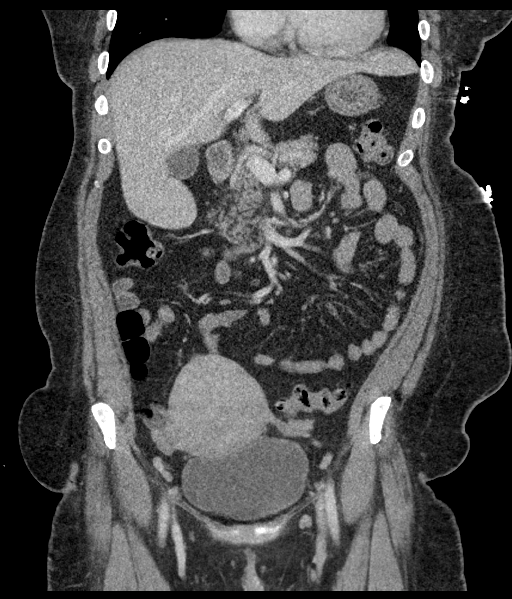
[im 70/126  soft-tissue]
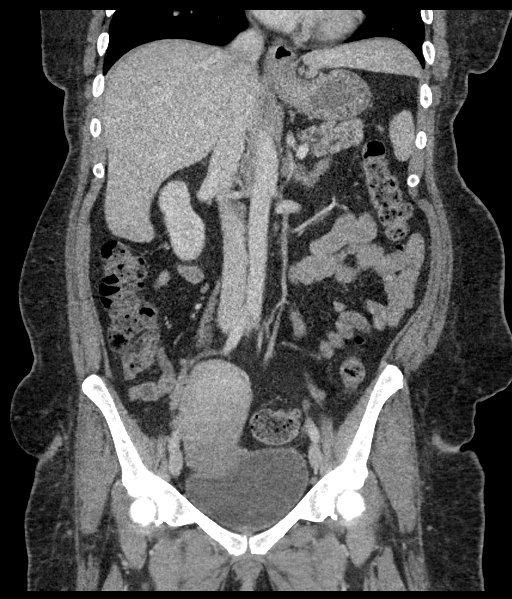

[16 of 46 positions shown; findings below may reference images not displayed]

FINDINGS: Lower chest: The visualized lung bases are grossly clear. The
visualized portions of the mediastinum are unremarkable.

Hepatobiliary: The liver is unremarkable in appearance. The
gallbladder is unremarkable in appearance. The common bile duct
remains normal in caliber.

Pancreas: The pancreas is within normal limits.

Spleen: The spleen is unremarkable in appearance.

Adrenals/Urinary Tract: The adrenal glands are unremarkable in
appearance. The kidneys are within normal limits. There is no
evidence of hydronephrosis. No renal or ureteral stones are
identified. No perinephric stranding is seen.

Stomach/Bowel: The stomach is unremarkable in appearance. The small
bowel is within normal limits. The appendix is normal in caliber,
without evidence of appendicitis. The colon is unremarkable in
appearance.

Vascular/Lymphatic: The abdominal aorta is unremarkable in
appearance. The inferior vena cava is grossly unremarkable. No
retroperitoneal lymphadenopathy is seen. No pelvic sidewall
lymphadenopathy is identified.

Reproductive: A large uterine fibroid is noted. The uterus is
otherwise unremarkable. The ovaries are relatively symmetric. No
suspicious adnexal masses are seen. The bladder is mildly distended
and grossly unremarkable.

Other: No additional soft tissue abnormalities are seen.

Musculoskeletal: No acute osseous abnormalities are identified. The
visualized musculature is unremarkable in appearance.
IMPRESSION: 1. No acute abnormality seen within the abdomen or pelvis.
2. Large uterine fibroid noted.

## 2019-07-13 IMAGING — US IR ANGIO/ADD [PERSON_NAME]
1 series · 1 of 1 positions shown · non-contrast
Comparison: none

INDICATION: Symptomatic uterine fibroids. Patient recently started on anti
coagulation secondary DVT and pulmonary embolism with associated
worsening of her menorrhagia. As such, patient presents now for
urine fibroid embolization.

[Series 1: ir (id) (id)/(id) · 1 of 1 slices shown]
[im 1/1]
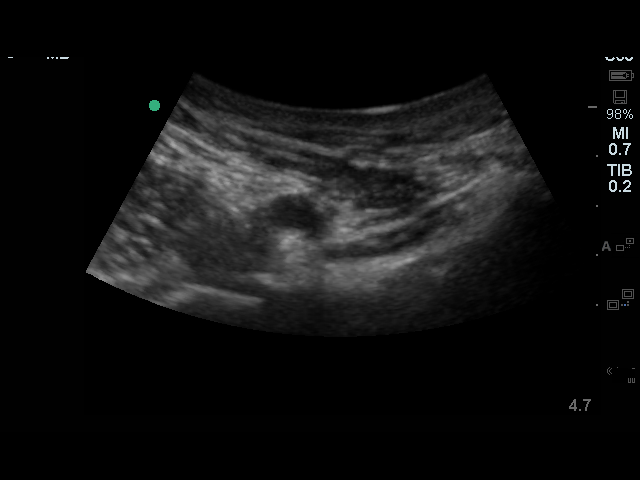

[1 of 1 positions shown; findings below may reference images not displayed]

EXAM:
Uterine Fibroid Embolization

MEDICATIONS:
Toradol 30 mg IV; Ancef 2 gm IV. The antibiotic was administered
within one hour of the procedure

ANESTHESIA/SEDATION:
Moderate (conscious) sedation was employed during this procedure. A
total of Versed 3 mg and Fentanyl 200 mcg was administered
intravenously.

Moderate Sedation Time: 84 minutes. [name] level of
consciousness and vital signs were monitored continuously by
radiology nursing throughout the procedure under my direct
supervision.

CONTRAST:  80 cc Isovue 300

FLUOROSCOPY TIME:  16 minutes 36 seconds (1,174 mGy)

COMPLICATIONS:
None immediate.

PROCEDURE:
Informed consent was obtained from the patient following explanation
of the procedure, risks, benefits and alternatives. The patient
understands, agrees and consents for the procedure. All questions
were addressed. A time out was performed prior to the initiation of
the procedure. Maximal barrier sterile technique utilized including
caps, mask, sterile gowns, sterile gloves, large sterile drape, hand
hygiene, and Betadine prep.

The right femoral head was marked fluoroscopically. Under sterile
conditions and local anesthesia, the right common femoral artery
access was performed with a micropuncture needle. Under direct
ultrasound guidance, the right common femoral was accessed with a
micropuncture kit. An ultrasound image was saved for documentation
purposes. This allowed for placement of a 5-French vascular sheath.
A limited arteriogram was performed through the side arm of the
sheath confirming appropriate access within the right common femoral
artery.

The 5-French C2 catheter was utilized to select the contralateral
left internal iliac artery. Selective left internal iliac angiogram
was performed. The tortuous left uterine artery was identified.
Selective catheterization was performed of the left uterine artery
with a microcatheter and micro guide wire. A selective left uterine
angiogram was performed. This demonstrated patency of the left
uterine artery. Mild diffuse hypervascularity of the enlarged
fibroid uterus. Access was adequate for embolization. For
embolization, 1 vial of 500 - 700 micron Embospheres were injected
into the left uterine artery. Post embolization angiogram confirms
complete stasis of the left uterine vascular territory.
Microcatheter was removed.

The C2 catheter was retracted and utilized to select the right
internal iliac artery. Selective right internal iliac angiogram was
performed. The patent right uterine artery was identified. For
selective catheterization, the micro catheter and guidewire were
utilized to select the right uterine artery. Selective right uterine
angiogram was performed. This demonstrated patency of the right
uterine artery. Catheter position was safe for embolization.
Embolization was performed to complete stasis with injection of
vials of 500-700 micron Embospheres. Post embolization angiogram
confirms complete stasis of the right uterine vascular territory.

At this point, all wires, catheters and sheaths were removed from
the patient. Hemostasis was achieved at the right groin access site
with

The patient tolerated the procedure well without immediate post
procedural complication.
FINDINGS: Bilateral internal iliac arteriograms demonstrates conventional
branching pattern with widely patent origins of the bilateral
uterine arteries.

Sub selective bilateral uterine arteriograms demonstrate co-dominant
supply to a hypertrophied myomatous uterus.

Completion arteriograms following bilateral uterine artery particle
embolization demonstrates a technically excellent result with stasis
of flow within the bilateral uterine vascular territories.
IMPRESSION: Successful bilateral uterine artery embolization ([facility]).

PLAN:
The patient will be seen in follow-up consultation at the
[REDACTED] in approximately 3-4 weeks.

## 2019-07-22 IMAGING — CT CT CTA ABD/PEL W/CM AND/OR W/O CM
2 of 6 series · 15 of 46 positions shown, 18 images · IV contrast (OMNI 350)
Comparison: Abdominal CT 11/29/2016, multiple priors

CLINICAL DATA: Abdominal pain. Uterine artery embolization 1 week
prior.

EXAM:
CTA ABDOMEN AND PELVIS wITHOUT AND WITH CONTRAST
TECHNIQUE: Multidetector CT imaging of the abdomen and pelvis was performed
using the standard protocol during bolus administration of
intravenous contrast. Multiplanar reconstructed images and MIPs were
obtained and reviewed to evaluate the vascular anatomy.
CONTRAST:  100 cc Isovue 370 IV

[Series 5: dissection 2mm · axial · 0.91mm/px · z∈[+750,+1208]mm · 12 of 259 slices shown, 15 images]
[im 20/259  soft-tissue]
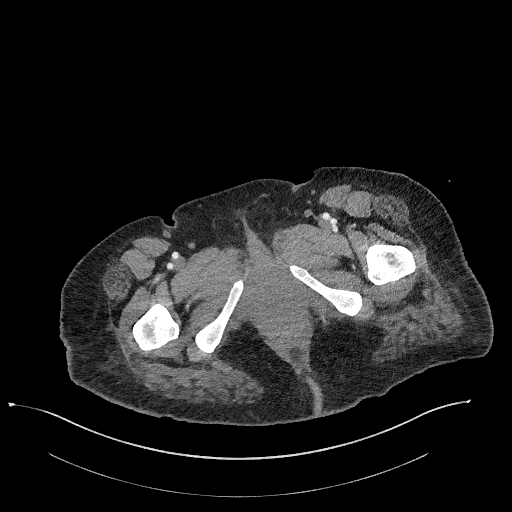
[im 20/259  bone]
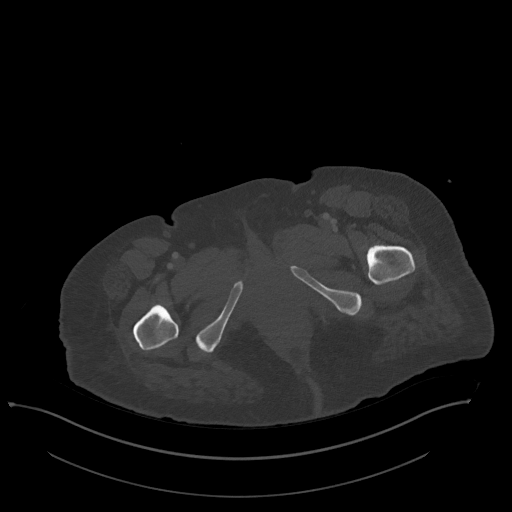
[im 50/259  soft-tissue]
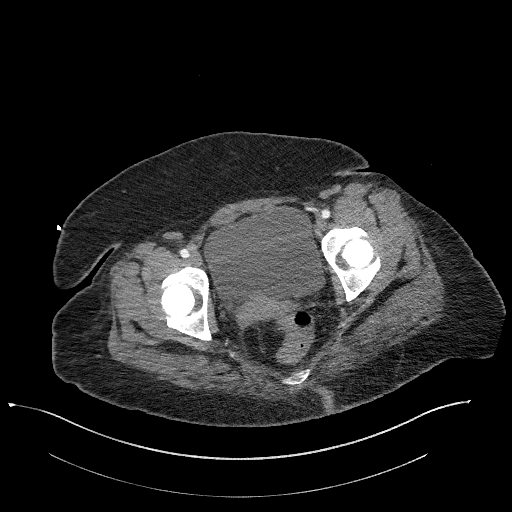
[im 70/259  soft-tissue]
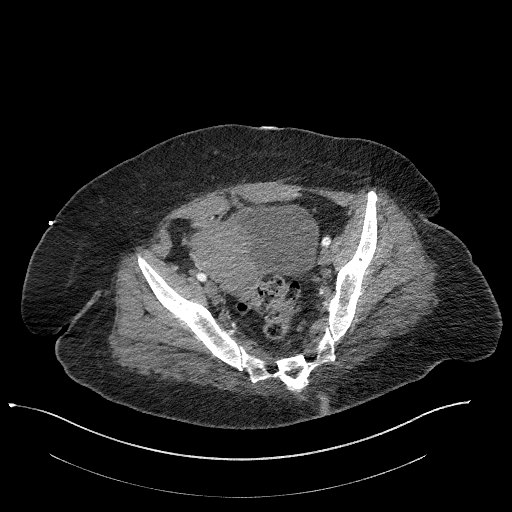
[im 100/259  soft-tissue]
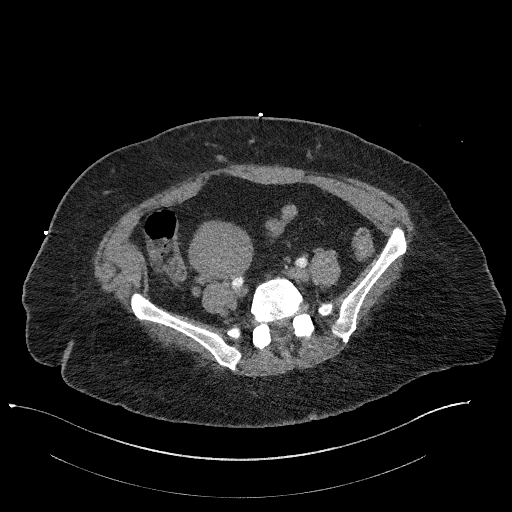
[im 130/259  soft-tissue]
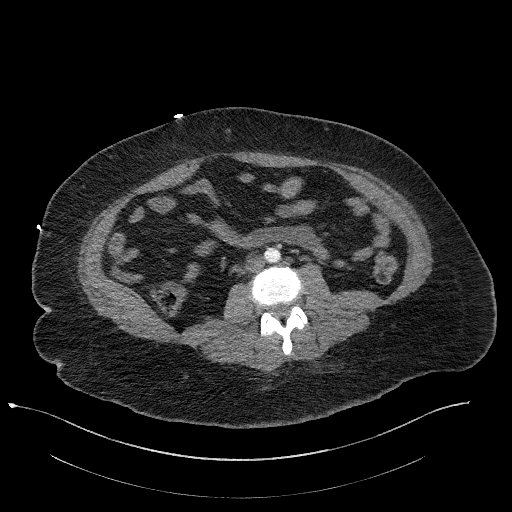
[im 159/259  soft-tissue]
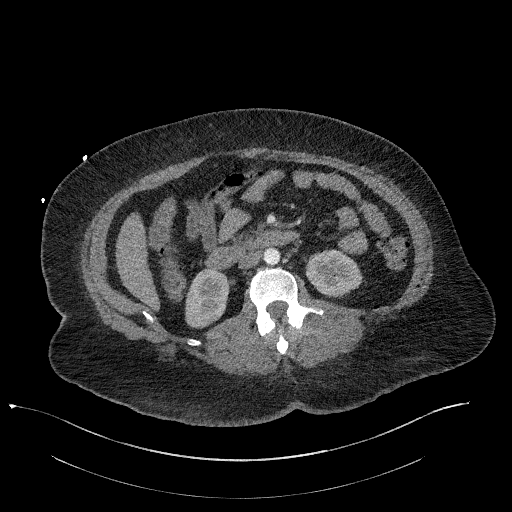
[im 189/259  soft-tissue]
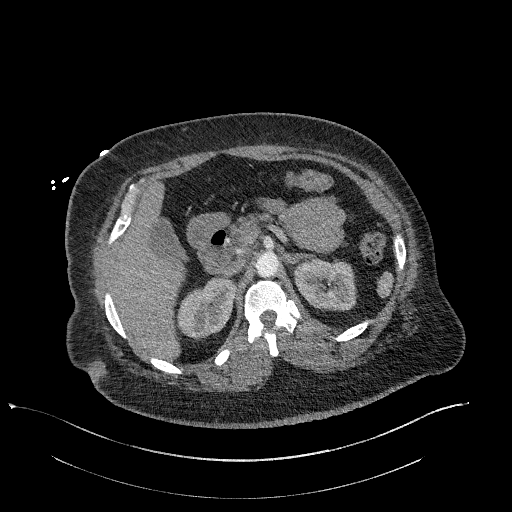
[im 209/259  soft-tissue]
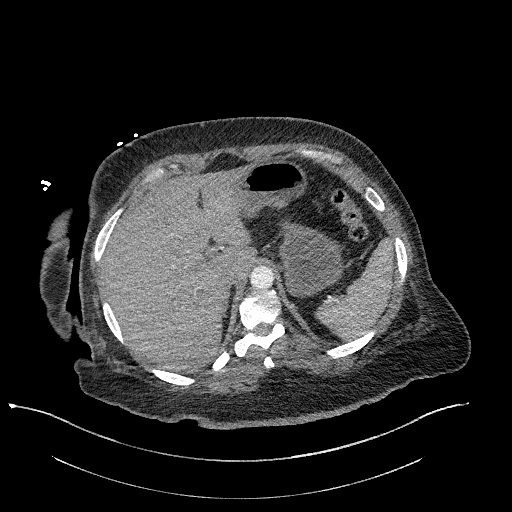
[im 219/259  lung]
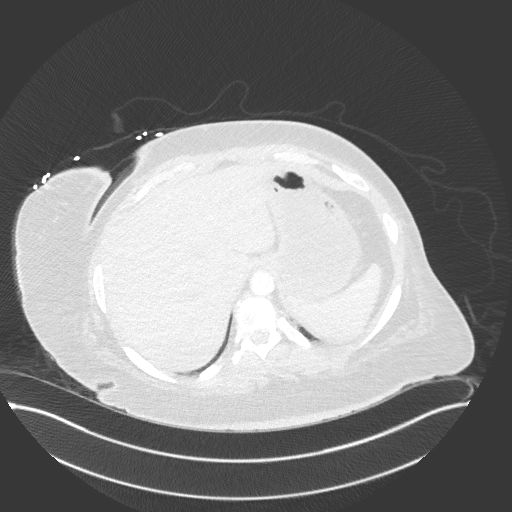
[im 229/259  lung]
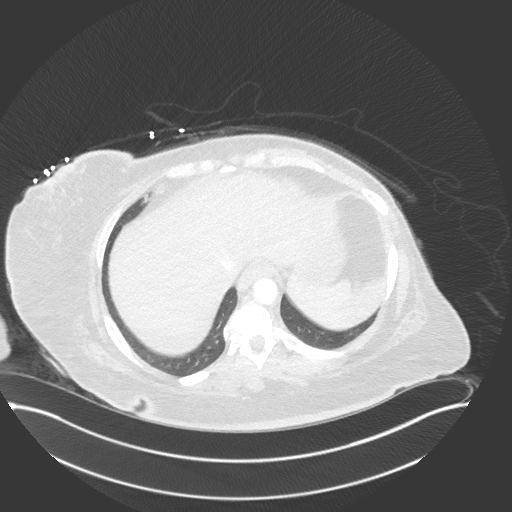
[im 239/259  soft-tissue]
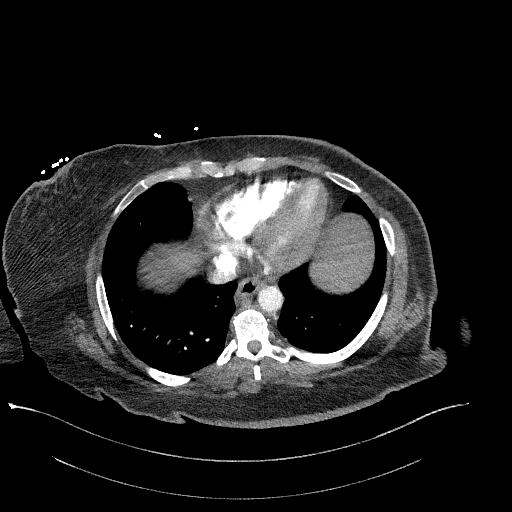
[im 239/259  lung]
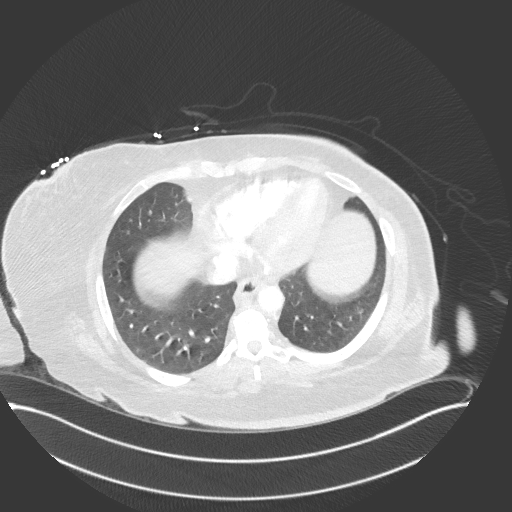
[im 239/259  bone]
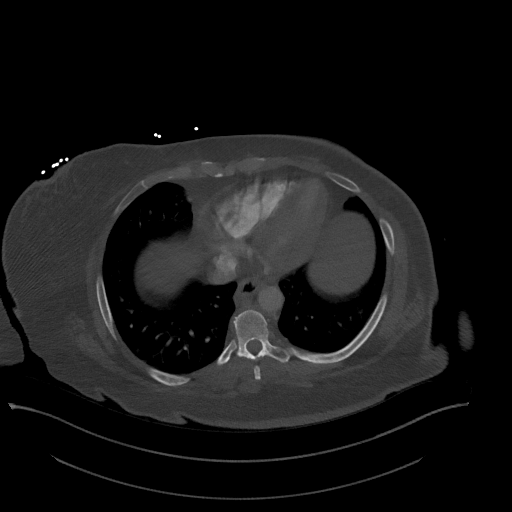
[im 249/259  lung]
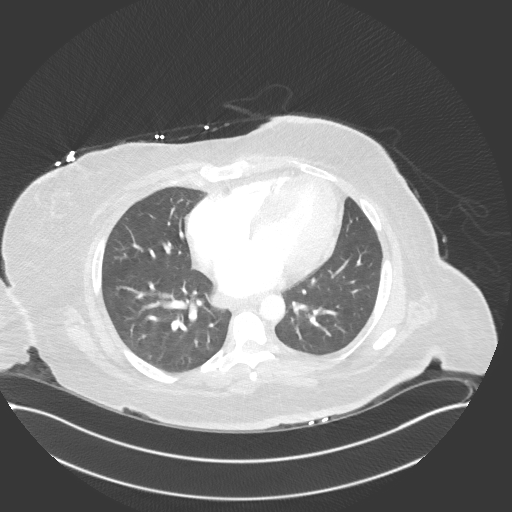

[Series 8: dissection 2mm cor · coronal · 1.01mm/px · 3 of 148 slices shown]
[im 37/148  soft-tissue]
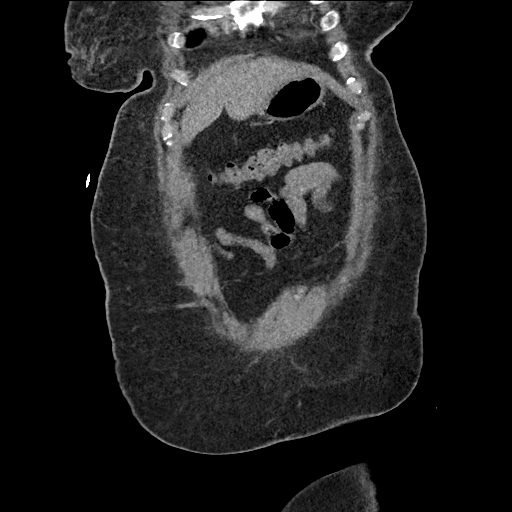
[im 74/148  soft-tissue]
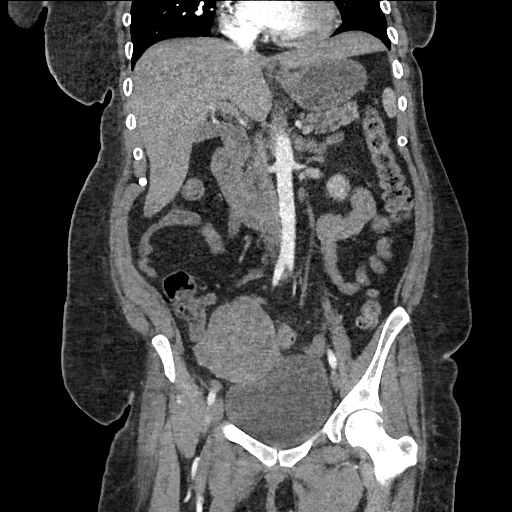
[im 111/148  soft-tissue]
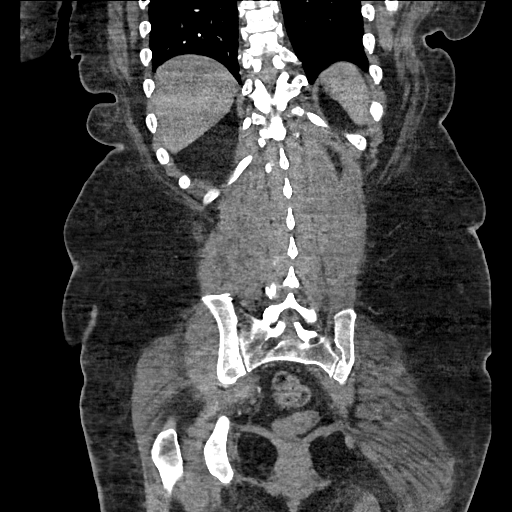

[15 of 46 positions shown; findings below may reference images not displayed]

FINDINGS: VASCULAR

Suboptimal arterial bolus timing. Examination remains diagnostic for
evaluation of dissection

Aorta: Normal caliber aorta without aneurysm, dissection, vasculitis
or significant stenosis.

Celiac: Patent without evidence of aneurysm, dissection, vasculitis
or significant stenosis.

SMA: Patent without evidence of aneurysm, dissection, vasculitis or
significant stenosis.

Renals: Both renal arteries are patent without evidence of aneurysm,
dissection, vasculitis, fibromuscular dysplasia or significant
stenosis.

IMA: Patent without evidence of aneurysm, dissection, vasculitis or
significant stenosis.

Inflow: Patent without evidence of aneurysm, dissection, vasculitis
or significant stenosis.

Proximal Outflow: Bilateral common femoral and visualized portions
of the superficial and profunda femoral arteries are patent without
evidence of aneurysm, dissection, vasculitis or significant
stenosis.

Veins: No obvious venous abnormality within the limitations of this
arterial phase study.

Review of the MIP images confirms the above findings.

NON-VASCULAR

Lower chest: The lung bases are clear.

Hepatobiliary: No focal hepatic lesion. Gallbladder physiologically
distended, no calcified stone. No biliary dilatation.

Pancreas: No ductal dilatation or inflammation.

Spleen: Normal in size without focal abnormality. Splenule
inferiorly.

Adrenals/Urinary Tract: No adrenal nodule. No hydronephrosis or
perinephric edema. Urinary bladder is physiologically distended.

Stomach/Bowel: Stomach is within normal limits. Appendix appears
normal. No evidence of bowel wall thickening, distention, or
inflammatory changes.

Lymphatic: Small bilateral external iliac nodes, unchanged from
prior exam. No new or progressive adenopathy.

Reproductive: There is central air within the uterine fibroid, a
normal finding post recent uterine artery embolization. No adnexal
mass.

Other: No free air, free fluid, or intra-abdominal fluid collection.

Musculoskeletal: There are no acute or suspicious osseous
abnormalities.
IMPRESSION: VASCULAR

No acute vascular abnormality post recent uterine artery
embolization.

NON-VASCULAR

Expected appearance of the uterine fibroid post embolization. No
evident complication. No additional acute abnormality in the
abdomen/pelvis.

## 2019-07-30 IMAGING — CT CT ABD-PELV W/ CM
2 of 5 series · 16 of 46 positions shown, 18 images · IV contrast (Omni 300)
Comparison: 12/16/2016 CT abdomen and pelvis.

CLINICAL DATA: 41 y/o F; abdominal pain, nausea, and acute
vomiting.

EXAM:
CT ABDOMEN AND PELVIS WITH CONTRAST
TECHNIQUE: Multidetector CT imaging of the abdomen and pelvis was performed
using the standard protocol following bolus administration of
intravenous contrast.
CONTRAST:  100 cc Gsovue-LFF

[Series 3: a/p w/ 5mm · axial · 0.92mm/px · z∈[+804,+1229]mm · 13 of 97 slices shown, 15 images]
[im 6/97  soft-tissue]
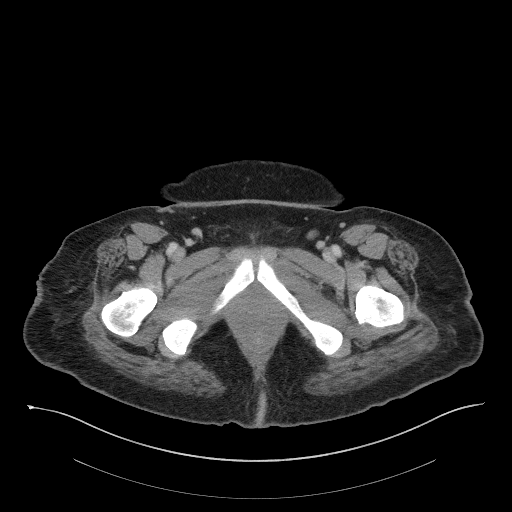
[im 6/97  bone]
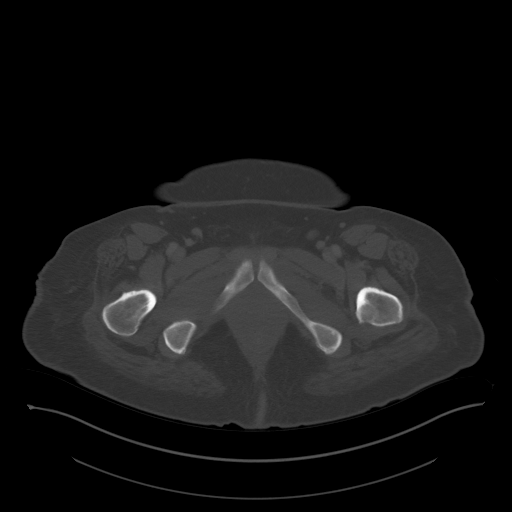
[im 12/97  soft-tissue]
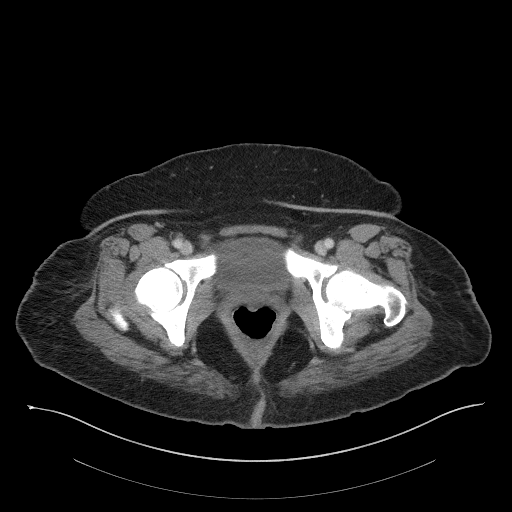
[im 23/97  soft-tissue]
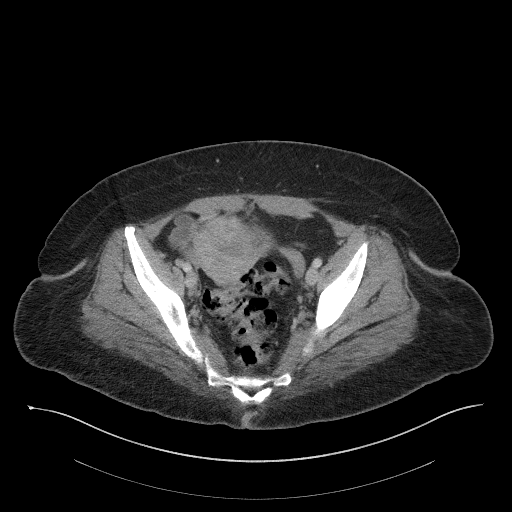
[im 29/97  soft-tissue]
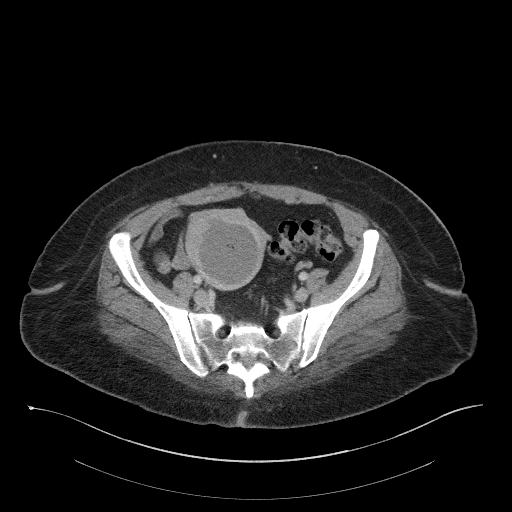
[im 34/97  soft-tissue]
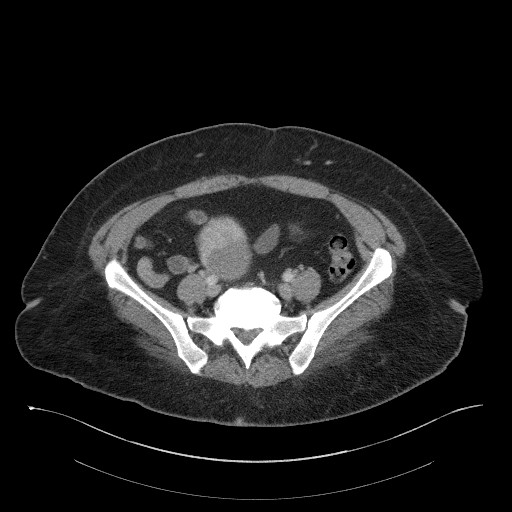
[im 40/97  soft-tissue]
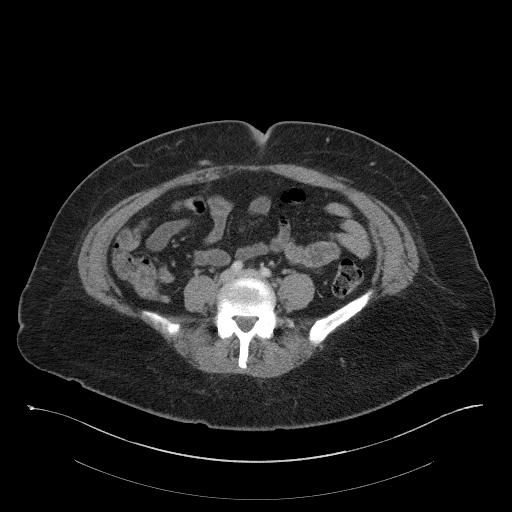
[im 51/97  soft-tissue]
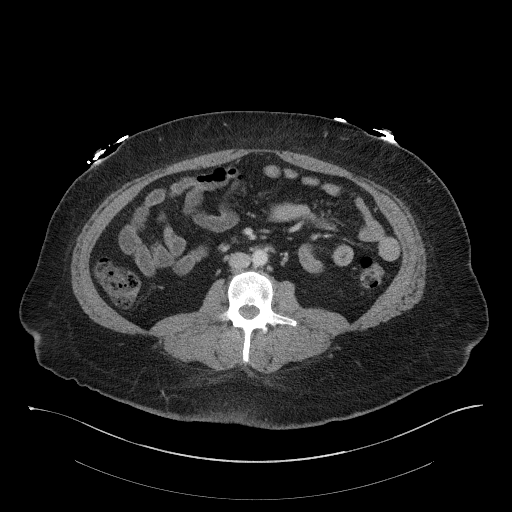
[im 57/97  soft-tissue]
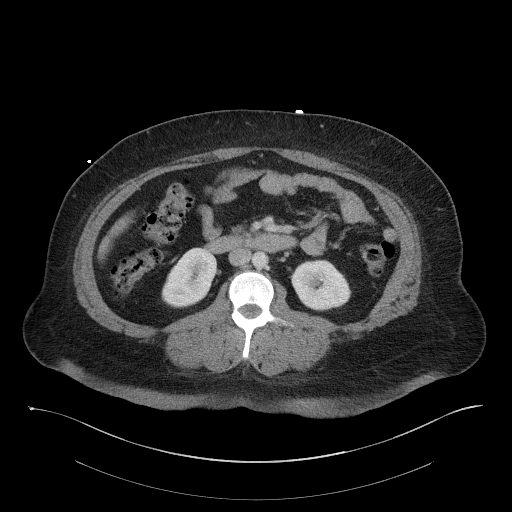
[im 63/97  soft-tissue]
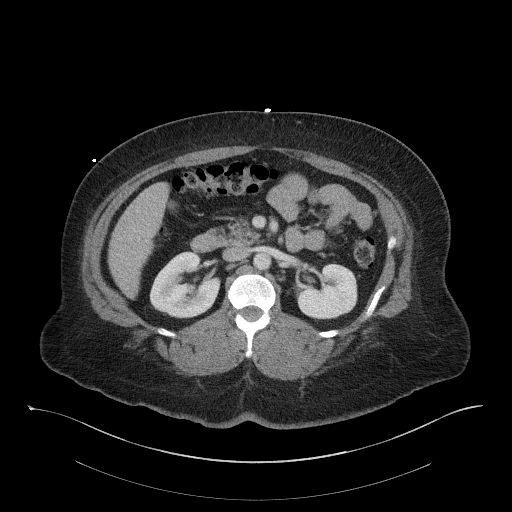
[im 63/97  bone]
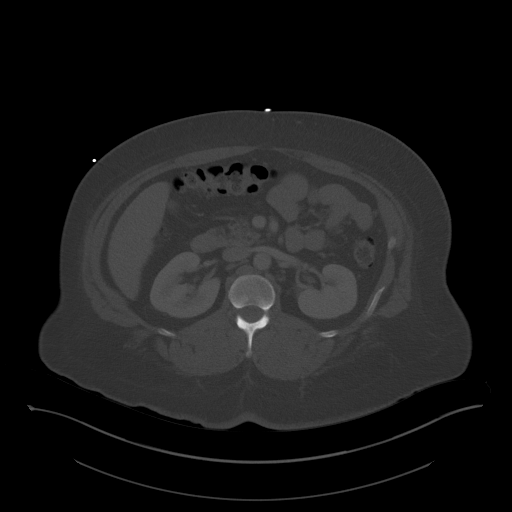
[im 68/97  soft-tissue]
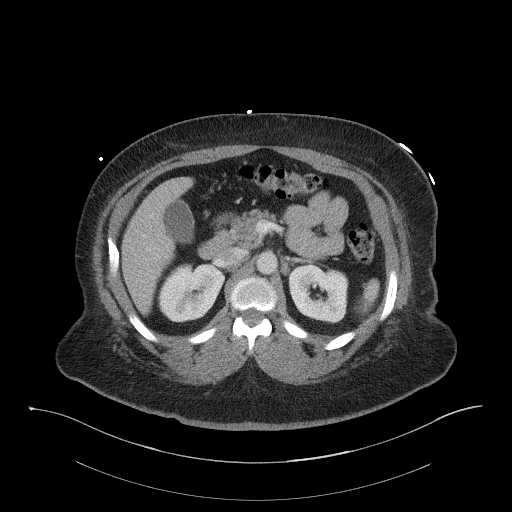
[im 74/97  soft-tissue]
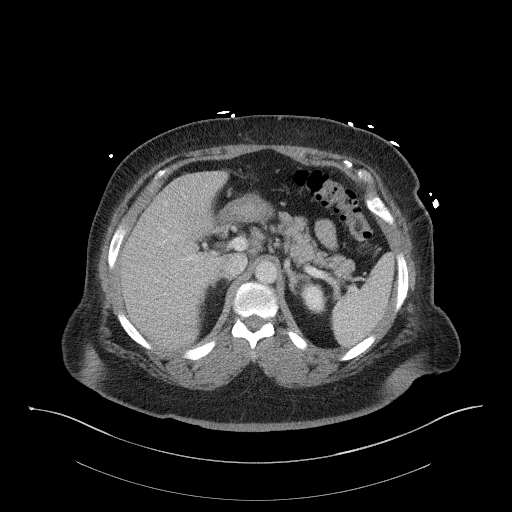
[im 85/97  soft-tissue]
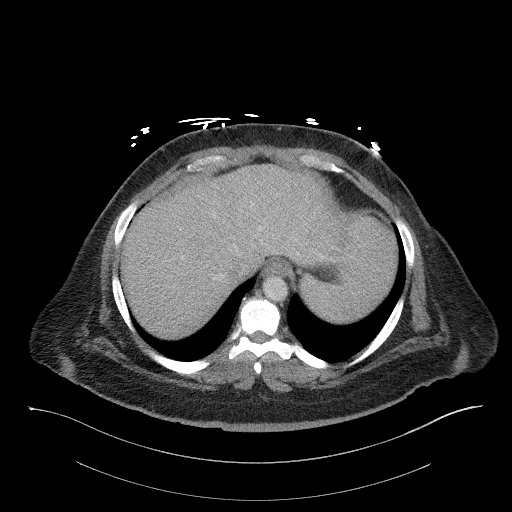
[im 91/97  soft-tissue]
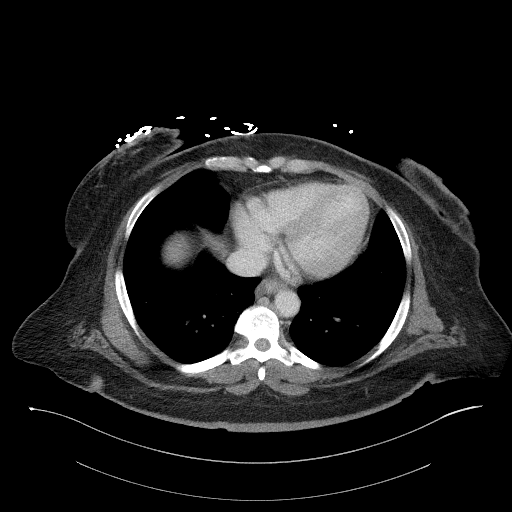

[Series 6: a/p w/ cor · coronal · 0.73mm/px · 3 of 141 slices shown]
[im 47/141  soft-tissue]
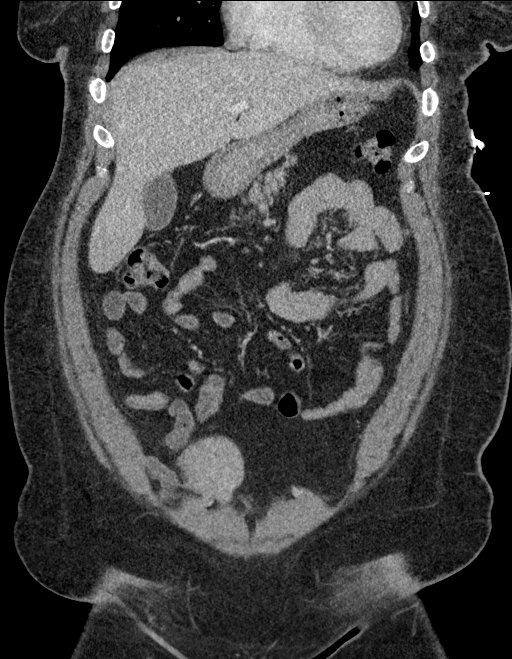
[im 63/141  soft-tissue]
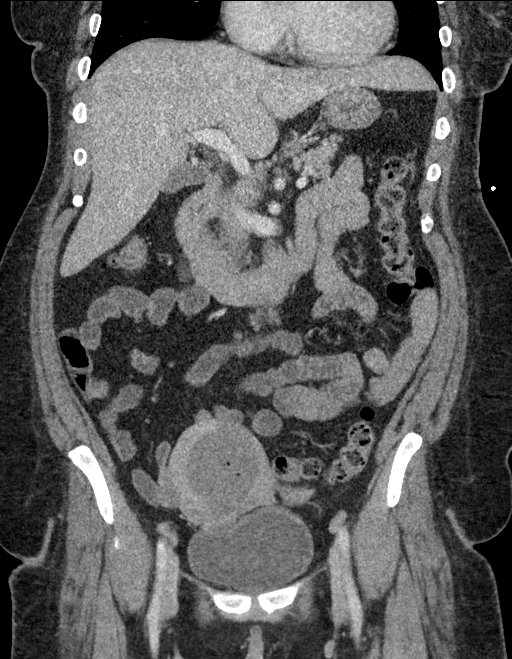
[im 78/141  soft-tissue]
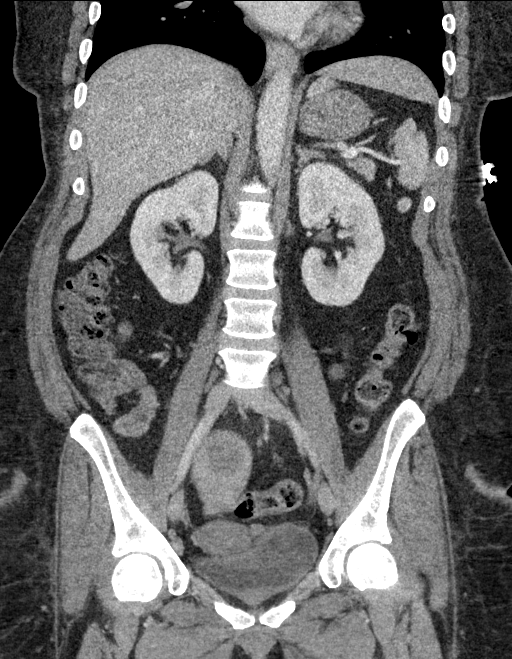

[16 of 46 positions shown; findings below may reference images not displayed]

FINDINGS: Lower chest: No acute abnormality.

Hepatobiliary: No focal liver abnormality is seen. No gallstones,
gallbladder wall thickening, or biliary dilatation.

Pancreas: Unremarkable. No pancreatic ductal dilatation or
surrounding inflammatory changes.

Spleen: Normal in size without focal abnormality.

Adrenals/Urinary Tract: Adrenal glands are unremarkable. Kidneys are
normal, without renal calculi, focal lesion, or hydronephrosis.
Bladder is unremarkable.

Stomach/Bowel: Stomach is within normal limits. Appendix appears
normal. No evidence of bowel wall thickening, distention, or
inflammatory changes.

Vascular/Lymphatic: No significant vascular findings are present. No
enlarged abdominal or pelvic lymph nodes.

Reproductive: Interval hypoattenuation of 6 cm exophytic uterine
fibroma compatible with necrosis and posttreatment changes. No
adnexal mass identified.

Other: No abdominal wall hernia or abnormality. No abdominopelvic
ascites.

Musculoskeletal: No fracture is seen. The degenerative changes the
spine predominantly at the L5-S1 level were disc and facet disease
results in moderate bilateral foraminal stenosis and canal stenosis.
IMPRESSION: 1. Interval central hypoattenuation of large exophytic uterine
fibroid compatible with necrosis and posttreatment changes.
2. Otherwise unremarkable CT of the abdomen and pelvis.

By: Alleck Ten Canana M.D.

## 2019-09-12 MED FILL — CYCLOBENZAPRINE 5 MG TABLET: 5 | 30 days supply | Qty: 180 | Fill #0

## 2019-09-12 MED FILL — LOSARTAN POTASSIUM 100 MG T: 100 | 90 days supply | Qty: 90 | Fill #0

## 2019-09-12 MED FILL — GABAPENTIN 600 MG TABLET: 600 | 30 days supply | Qty: 60 | Fill #0

## 2019-11-17 ENCOUNTER — Ambulatory Visit: Payer: Medicare Other | Admitting: Obstetrics and Gynecology

## 2019-11-23 ENCOUNTER — Encounter (HOSPITAL_COMMUNITY): Payer: Self-pay

## 2019-11-23 ENCOUNTER — Emergency Department (HOSPITAL_COMMUNITY): Payer: Medicare Other

## 2019-11-23 ENCOUNTER — Inpatient Hospital Stay (HOSPITAL_COMMUNITY)
Admission: EM | Admit: 2019-11-23 | Discharge: 2019-11-30 | DRG: 871 | Disposition: A | Discharge status: Home / Self Care | Payer: Medicare Other | Attending: Internal Medicine | Admitting: Internal Medicine

## 2019-11-23 DIAGNOSIS — E1122 Type 2 diabetes mellitus with diabetic chronic kidney disease: Secondary | ICD-10-CM | POA: Diagnosis present

## 2019-11-23 DIAGNOSIS — E1165 Type 2 diabetes mellitus with hyperglycemia: Secondary | ICD-10-CM | POA: Diagnosis present

## 2019-11-23 DIAGNOSIS — A4189 Other specified sepsis: Principal | ICD-10-CM | POA: Diagnosis present

## 2019-11-23 DIAGNOSIS — I252 Old myocardial infarction: Secondary | ICD-10-CM

## 2019-11-23 DIAGNOSIS — F329 Major depressive disorder, single episode, unspecified: Secondary | ICD-10-CM | POA: Diagnosis present

## 2019-11-23 DIAGNOSIS — E1110 Type 2 diabetes mellitus with ketoacidosis without coma: Secondary | ICD-10-CM

## 2019-11-23 DIAGNOSIS — B181 Chronic viral hepatitis B without delta-agent: Secondary | ICD-10-CM | POA: Diagnosis present

## 2019-11-23 DIAGNOSIS — K219 Gastro-esophageal reflux disease without esophagitis: Secondary | ICD-10-CM | POA: Diagnosis present

## 2019-11-23 DIAGNOSIS — Z8673 Personal history of transient ischemic attack (TIA), and cerebral infarction without residual deficits: Secondary | ICD-10-CM

## 2019-11-23 DIAGNOSIS — M797 Fibromyalgia: Secondary | ICD-10-CM | POA: Diagnosis present

## 2019-11-23 DIAGNOSIS — J121 Respiratory syncytial virus pneumonia: Secondary | ICD-10-CM | POA: Diagnosis present

## 2019-11-23 DIAGNOSIS — A419 Sepsis, unspecified organism: Secondary | ICD-10-CM | POA: Diagnosis present

## 2019-11-23 DIAGNOSIS — Z86711 Personal history of pulmonary embolism: Secondary | ICD-10-CM

## 2019-11-23 DIAGNOSIS — B974 Respiratory syncytial virus as the cause of diseases classified elsewhere: Secondary | ICD-10-CM | POA: Diagnosis present

## 2019-11-23 DIAGNOSIS — F1290 Cannabis use, unspecified, uncomplicated: Secondary | ICD-10-CM | POA: Diagnosis present

## 2019-11-23 DIAGNOSIS — F1120 Opioid dependence, uncomplicated: Secondary | ICD-10-CM | POA: Diagnosis present

## 2019-11-23 DIAGNOSIS — N179 Acute kidney failure, unspecified: Secondary | ICD-10-CM | POA: Diagnosis present

## 2019-11-23 DIAGNOSIS — N1831 Chronic kidney disease, stage 3a: Secondary | ICD-10-CM | POA: Diagnosis present

## 2019-11-23 DIAGNOSIS — Z888 Allergy status to other drugs, medicaments and biological substances status: Secondary | ICD-10-CM

## 2019-11-23 DIAGNOSIS — I10 Essential (primary) hypertension: Secondary | ICD-10-CM | POA: Diagnosis present

## 2019-11-23 DIAGNOSIS — J189 Pneumonia, unspecified organism: Secondary | ICD-10-CM | POA: Diagnosis not present

## 2019-11-23 DIAGNOSIS — J45901 Unspecified asthma with (acute) exacerbation: Secondary | ICD-10-CM | POA: Diagnosis present

## 2019-11-23 DIAGNOSIS — R059 Cough, unspecified: Secondary | ICD-10-CM

## 2019-11-23 DIAGNOSIS — I160 Hypertensive urgency: Secondary | ICD-10-CM | POA: Diagnosis present

## 2019-11-23 DIAGNOSIS — Z955 Presence of coronary angioplasty implant and graft: Secondary | ICD-10-CM

## 2019-11-23 DIAGNOSIS — Z885 Allergy status to narcotic agent status: Secondary | ICD-10-CM

## 2019-11-23 DIAGNOSIS — G894 Chronic pain syndrome: Secondary | ICD-10-CM | POA: Diagnosis present

## 2019-11-23 DIAGNOSIS — D72829 Elevated white blood cell count, unspecified: Secondary | ICD-10-CM

## 2019-11-23 DIAGNOSIS — Z79899 Other long term (current) drug therapy: Secondary | ICD-10-CM

## 2019-11-23 DIAGNOSIS — D473 Essential (hemorrhagic) thrombocythemia: Secondary | ICD-10-CM | POA: Diagnosis present

## 2019-11-23 DIAGNOSIS — R079 Chest pain, unspecified: Secondary | ICD-10-CM

## 2019-11-23 DIAGNOSIS — I130 Hypertensive heart and chronic kidney disease with heart failure and stage 1 through stage 4 chronic kidney disease, or unspecified chronic kidney disease: Secondary | ICD-10-CM | POA: Diagnosis present

## 2019-11-23 DIAGNOSIS — F1910 Other psychoactive substance abuse, uncomplicated: Secondary | ICD-10-CM | POA: Diagnosis present

## 2019-11-23 DIAGNOSIS — R1032 Left lower quadrant pain: Secondary | ICD-10-CM | POA: Diagnosis present

## 2019-11-23 DIAGNOSIS — F1490 Cocaine use, unspecified, uncomplicated: Secondary | ICD-10-CM | POA: Diagnosis present

## 2019-11-23 DIAGNOSIS — Z86718 Personal history of other venous thrombosis and embolism: Secondary | ICD-10-CM

## 2019-11-23 DIAGNOSIS — F419 Anxiety disorder, unspecified: Secondary | ICD-10-CM | POA: Diagnosis present

## 2019-11-23 DIAGNOSIS — K3184 Gastroparesis: Secondary | ICD-10-CM | POA: Diagnosis present

## 2019-11-23 DIAGNOSIS — I2510 Atherosclerotic heart disease of native coronary artery without angina pectoris: Secondary | ICD-10-CM | POA: Diagnosis present

## 2019-11-23 DIAGNOSIS — Z794 Long term (current) use of insulin: Secondary | ICD-10-CM

## 2019-11-23 DIAGNOSIS — J9601 Acute respiratory failure with hypoxia: Secondary | ICD-10-CM | POA: Diagnosis present

## 2019-11-23 DIAGNOSIS — E785 Hyperlipidemia, unspecified: Secondary | ICD-10-CM | POA: Diagnosis present

## 2019-11-23 DIAGNOSIS — I509 Heart failure, unspecified: Secondary | ICD-10-CM | POA: Clinically undetermined

## 2019-11-23 DIAGNOSIS — Z87891 Personal history of nicotine dependence: Secondary | ICD-10-CM

## 2019-11-23 DIAGNOSIS — Z833 Family history of diabetes mellitus: Secondary | ICD-10-CM

## 2019-11-23 DIAGNOSIS — Z20822 Contact with and (suspected) exposure to covid-19: Secondary | ICD-10-CM | POA: Diagnosis present

## 2019-11-23 DIAGNOSIS — E1142 Type 2 diabetes mellitus with diabetic polyneuropathy: Secondary | ICD-10-CM | POA: Diagnosis present

## 2019-11-23 DIAGNOSIS — R112 Nausea with vomiting, unspecified: Secondary | ICD-10-CM | POA: Diagnosis present

## 2019-11-23 DIAGNOSIS — G4733 Obstructive sleep apnea (adult) (pediatric): Secondary | ICD-10-CM | POA: Diagnosis present

## 2019-11-23 DIAGNOSIS — Z6835 Body mass index (BMI) 35.0-35.9, adult: Secondary | ICD-10-CM

## 2019-11-23 DIAGNOSIS — E1143 Type 2 diabetes mellitus with diabetic autonomic (poly)neuropathy: Secondary | ICD-10-CM | POA: Diagnosis present

## 2019-11-23 DIAGNOSIS — D75839 Thrombocytosis, unspecified: Secondary | ICD-10-CM | POA: Diagnosis present

## 2019-11-23 DIAGNOSIS — G8929 Other chronic pain: Secondary | ICD-10-CM | POA: Diagnosis present

## 2019-11-23 DIAGNOSIS — D509 Iron deficiency anemia, unspecified: Secondary | ICD-10-CM | POA: Diagnosis present

## 2019-11-23 LAB — CBC
HCT: 41.3 % (ref 36.0–46.0)
Hemoglobin: 14.3 g/dL (ref 12.0–15.0)
MCH: 28.4 pg (ref 26.0–34.0)
MCHC: 34.6 g/dL (ref 30.0–36.0)
MCV: 81.9 fL (ref 80.0–100.0)
Platelets: 427 10*3/uL — ABNORMAL HIGH (ref 150–400)
RBC: 5.04 MIL/uL (ref 3.87–5.11)
RDW: 14.1 % (ref 11.5–15.5)
WBC: 12.1 10*3/uL — ABNORMAL HIGH (ref 4.0–10.5)
nRBC: 0 % (ref 0.0–0.2)

## 2019-11-23 LAB — BASIC METABOLIC PANEL
Anion gap: 15 (ref 5–15)
BUN: 18 mg/dL (ref 6–20)
CO2: 17 mmol/L — ABNORMAL LOW (ref 22–32)
Calcium: 9.2 mg/dL (ref 8.9–10.3)
Chloride: 104 mmol/L (ref 98–111)
Creatinine, Ser: 1.64 mg/dL — ABNORMAL HIGH (ref 0.44–1.00)
GFR calc Af Amer: 43 mL/min — ABNORMAL LOW (ref >60)
GFR calc non Af Amer: 37 mL/min — ABNORMAL LOW (ref >60)
Glucose, Bld: 211 mg/dL — ABNORMAL HIGH (ref 70–99)
Potassium: 4.1 mmol/L (ref 3.5–5.1)
Sodium: 136 mmol/L (ref 135–145)

## 2019-11-23 LAB — I-STAT BETA HCG BLOOD, ED (MC, WL, AP ONLY): I-stat hCG, quantitative: <5 m[IU]/mL (ref <5)

## 2019-11-23 LAB — TROPONIN I (HIGH SENSITIVITY)
Troponin I (High Sensitivity): 15 ng/L (ref <18)
Troponin I (High Sensitivity): 17 ng/L (ref <18)

## 2019-11-23 LAB — LIPASE, BLOOD: Lipase: 19 U/L (ref 11–51)

## 2019-11-23 MED ORDER — ONDANSETRON 4 MG PO TBDP
4.0000 mg | ORAL_TABLET | Freq: Once | ORAL | Status: AC | PRN
  Administered 2019-11-23: 4 mg via ORAL
  Filled 2019-11-23: qty 1

## 2019-11-23 NOTE — ED Triage Notes (Signed)
Pt BIB [facility] for eval of CP x 3 days w/ associated abd pain, N/V X 3 days. Pt was seen here for same 3 days ago, left d/t wait time. Pt w/ hx of stent, cirrhosis of liver.

## 2019-11-24 ENCOUNTER — Other Ambulatory Visit: Payer: Self-pay

## 2019-11-24 DIAGNOSIS — A419 Sepsis, unspecified organism: Secondary | ICD-10-CM | POA: Diagnosis not present

## 2019-11-24 DIAGNOSIS — Z885 Allergy status to narcotic agent status: Secondary | ICD-10-CM | POA: Diagnosis not present

## 2019-11-24 DIAGNOSIS — R1032 Left lower quadrant pain: Secondary | ICD-10-CM | POA: Diagnosis not present

## 2019-11-24 DIAGNOSIS — A4189 Other specified sepsis: Secondary | ICD-10-CM | POA: Diagnosis present

## 2019-11-24 DIAGNOSIS — F1910 Other psychoactive substance abuse, uncomplicated: Secondary | ICD-10-CM | POA: Diagnosis present

## 2019-11-24 DIAGNOSIS — E1165 Type 2 diabetes mellitus with hyperglycemia: Secondary | ICD-10-CM | POA: Diagnosis present

## 2019-11-24 DIAGNOSIS — E1142 Type 2 diabetes mellitus with diabetic polyneuropathy: Secondary | ICD-10-CM | POA: Diagnosis present

## 2019-11-24 DIAGNOSIS — Z86718 Personal history of other venous thrombosis and embolism: Secondary | ICD-10-CM | POA: Diagnosis not present

## 2019-11-24 DIAGNOSIS — E1143 Type 2 diabetes mellitus with diabetic autonomic (poly)neuropathy: Secondary | ICD-10-CM

## 2019-11-24 DIAGNOSIS — G8929 Other chronic pain: Secondary | ICD-10-CM | POA: Diagnosis present

## 2019-11-24 DIAGNOSIS — K3184 Gastroparesis: Secondary | ICD-10-CM

## 2019-11-24 DIAGNOSIS — G894 Chronic pain syndrome: Secondary | ICD-10-CM | POA: Diagnosis present

## 2019-11-24 DIAGNOSIS — N179 Acute kidney failure, unspecified: Secondary | ICD-10-CM | POA: Diagnosis present

## 2019-11-24 DIAGNOSIS — J9601 Acute respiratory failure with hypoxia: Secondary | ICD-10-CM | POA: Diagnosis present

## 2019-11-24 DIAGNOSIS — J121 Respiratory syncytial virus pneumonia: Secondary | ICD-10-CM | POA: Diagnosis present

## 2019-11-24 DIAGNOSIS — Z888 Allergy status to other drugs, medicaments and biological substances status: Secondary | ICD-10-CM | POA: Diagnosis not present

## 2019-11-24 DIAGNOSIS — D473 Essential (hemorrhagic) thrombocythemia: Secondary | ICD-10-CM

## 2019-11-24 DIAGNOSIS — B974 Respiratory syncytial virus as the cause of diseases classified elsewhere: Secondary | ICD-10-CM | POA: Diagnosis present

## 2019-11-24 DIAGNOSIS — E1122 Type 2 diabetes mellitus with diabetic chronic kidney disease: Secondary | ICD-10-CM | POA: Diagnosis present

## 2019-11-24 DIAGNOSIS — J189 Pneumonia, unspecified organism: Secondary | ICD-10-CM | POA: Diagnosis present

## 2019-11-24 DIAGNOSIS — F1120 Opioid dependence, uncomplicated: Secondary | ICD-10-CM | POA: Diagnosis present

## 2019-11-24 DIAGNOSIS — R609 Edema, unspecified: Secondary | ICD-10-CM | POA: Diagnosis not present

## 2019-11-24 DIAGNOSIS — Z86711 Personal history of pulmonary embolism: Secondary | ICD-10-CM | POA: Diagnosis not present

## 2019-11-24 DIAGNOSIS — R112 Nausea with vomiting, unspecified: Secondary | ICD-10-CM

## 2019-11-24 DIAGNOSIS — I160 Hypertensive urgency: Secondary | ICD-10-CM

## 2019-11-24 DIAGNOSIS — J45901 Unspecified asthma with (acute) exacerbation: Secondary | ICD-10-CM | POA: Diagnosis present

## 2019-11-24 DIAGNOSIS — F1290 Cannabis use, unspecified, uncomplicated: Secondary | ICD-10-CM | POA: Diagnosis present

## 2019-11-24 DIAGNOSIS — N1831 Chronic kidney disease, stage 3a: Secondary | ICD-10-CM | POA: Diagnosis present

## 2019-11-24 DIAGNOSIS — I130 Hypertensive heart and chronic kidney disease with heart failure and stage 1 through stage 4 chronic kidney disease, or unspecified chronic kidney disease: Secondary | ICD-10-CM | POA: Diagnosis present

## 2019-11-24 DIAGNOSIS — B181 Chronic viral hepatitis B without delta-agent: Secondary | ICD-10-CM | POA: Diagnosis present

## 2019-11-24 DIAGNOSIS — R7989 Other specified abnormal findings of blood chemistry: Secondary | ICD-10-CM | POA: Diagnosis not present

## 2019-11-24 DIAGNOSIS — F1490 Cocaine use, unspecified, uncomplicated: Secondary | ICD-10-CM | POA: Diagnosis present

## 2019-11-24 DIAGNOSIS — E785 Hyperlipidemia, unspecified: Secondary | ICD-10-CM | POA: Diagnosis present

## 2019-11-24 DIAGNOSIS — D75839 Thrombocytosis, unspecified: Secondary | ICD-10-CM | POA: Diagnosis present

## 2019-11-24 DIAGNOSIS — Z20822 Contact with and (suspected) exposure to covid-19: Secondary | ICD-10-CM | POA: Diagnosis present

## 2019-11-24 DIAGNOSIS — I10 Essential (primary) hypertension: Secondary | ICD-10-CM

## 2019-11-24 LAB — COMPREHENSIVE METABOLIC PANEL
ALT: 12 U/L (ref 0–44)
AST: 24 U/L (ref 15–41)
Albumin: 3.1 g/dL — ABNORMAL LOW (ref 3.5–5.0)
Alkaline Phosphatase: 63 U/L (ref 38–126)
Anion gap: 16 — ABNORMAL HIGH (ref 5–15)
BUN: 28 mg/dL — ABNORMAL HIGH (ref 6–20)
CO2: 19 mmol/L — ABNORMAL LOW (ref 22–32)
Calcium: 9.6 mg/dL (ref 8.9–10.3)
Chloride: 101 mmol/L (ref 98–111)
Creatinine, Ser: 2.13 mg/dL — ABNORMAL HIGH (ref 0.44–1.00)
GFR calc Af Amer: 31 mL/min — ABNORMAL LOW (ref >60)
GFR calc non Af Amer: 27 mL/min — ABNORMAL LOW (ref >60)
Glucose, Bld: 240 mg/dL — ABNORMAL HIGH (ref 70–99)
Potassium: 4.1 mmol/L (ref 3.5–5.1)
Sodium: 136 mmol/L (ref 135–145)
Total Bilirubin: 1.4 mg/dL — ABNORMAL HIGH (ref 0.3–1.2)
Total Protein: 8.6 g/dL — ABNORMAL HIGH (ref 6.5–8.1)

## 2019-11-24 LAB — D-DIMER, QUANTITATIVE: D-Dimer, Quant: 0.74 ug/mL-FEU — ABNORMAL HIGH (ref 0.00–0.50)

## 2019-11-24 LAB — APTT: aPTT: 24 seconds (ref 24–36)

## 2019-11-24 LAB — PROTIME-INR
INR: 1.1 (ref 0.8–1.2)
Prothrombin Time: 13.6 seconds (ref 11.4–15.2)

## 2019-11-24 LAB — CBG MONITORING, ED
Glucose-Capillary: 253 mg/dL — ABNORMAL HIGH (ref 70–99)
Glucose-Capillary: 202 mg/dL — ABNORMAL HIGH (ref 70–99)

## 2019-11-24 LAB — GLUCOSE, CAPILLARY: Glucose-Capillary: 361 mg/dL — ABNORMAL HIGH (ref 70–99)

## 2019-11-24 LAB — HIV ANTIBODY (ROUTINE TESTING W REFLEX): HIV Screen 4th Generation wRfx: NONREACTIVE

## 2019-11-24 LAB — CK: Total CK: 477 U/L — ABNORMAL HIGH (ref 38–234)

## 2019-11-24 LAB — LACTIC ACID, PLASMA
Lactic Acid, Venous: 1.9 mmol/L (ref 0.5–1.9)
Lactic Acid, Venous: 1.6 mmol/L (ref 0.5–1.9)

## 2019-11-24 LAB — SARS CORONAVIRUS 2 BY RT PCR (HOSPITAL ORDER, PERFORMED IN ~~LOC~~ HOSPITAL LAB): SARS Coronavirus 2: NEGATIVE

## 2019-11-24 MED ORDER — SODIUM CHLORIDE 0.9 % IV SOLN: INTRAVENOUS | Status: DC

## 2019-11-24 MED ORDER — PRAVASTATIN SODIUM 40 MG PO TABS
80.0000 mg | ORAL_TABLET | Freq: Every day | ORAL | Status: DC
  Administered 2019-11-24 – 2019-11-30 (×7): 80 mg via ORAL
  Filled 2019-11-24 (×7): qty 2

## 2019-11-24 MED ORDER — LORAZEPAM 2 MG/ML IJ SOLN
1.0000 mg | Freq: Once | INTRAMUSCULAR | Status: AC
  Administered 2019-11-24: 1 mg via INTRAVENOUS
  Filled 2019-11-24: qty 1

## 2019-11-24 MED ORDER — FENTANYL CITRATE (PF) 100 MCG/2ML IJ SOLN
25.0000 ug | INTRAMUSCULAR | Status: DC | PRN
  Administered 2019-11-24 – 2019-11-26 (×7): 25 ug via INTRAVENOUS
  Filled 2019-11-24 (×7): qty 2

## 2019-11-24 MED ORDER — ALBUTEROL SULFATE HFA 108 (90 BASE) MCG/ACT IN AERS
2.0000 | INHALATION_SPRAY | RESPIRATORY_TRACT | Status: DC | PRN
  Administered 2019-11-24: 2 via RESPIRATORY_TRACT
  Filled 2019-11-24: qty 6.7

## 2019-11-24 MED ORDER — INSULIN GLARGINE 100 UNIT/ML ~~LOC~~ SOLN
27.0000 [IU] | Freq: Every day | SUBCUTANEOUS | Status: DC
  Administered 2019-11-24 – 2019-11-25 (×2): 27 [IU] via SUBCUTANEOUS
  Filled 2019-11-24 (×3): qty 0.27

## 2019-11-24 MED ORDER — INSULIN GLARGINE 100 UNIT/ML SOLOSTAR PEN: 27.0000 [IU] | PEN_INJECTOR | Freq: Every day | SUBCUTANEOUS | Status: DC

## 2019-11-24 MED ORDER — SODIUM CHLORIDE 0.9 % IV SOLN
2.0000 g | INTRAVENOUS | Status: DC
  Administered 2019-11-24: 2 g via INTRAVENOUS
  Filled 2019-11-24: qty 20

## 2019-11-24 MED ORDER — HEPARIN SODIUM (PORCINE) 5000 UNIT/ML IJ SOLN
5000.0000 [IU] | Freq: Three times a day (TID) | INTRAMUSCULAR | Status: DC
  Administered 2019-11-24 – 2019-11-25 (×4): 5000 [IU] via SUBCUTANEOUS
  Filled 2019-11-24 (×4): qty 1

## 2019-11-24 MED ORDER — SALINE SPRAY 0.65 % NA SOLN
1.0000 | NASAL | Status: DC | PRN
  Filled 2019-11-24 (×2): qty 44

## 2019-11-24 MED ORDER — LACTATED RINGERS IV SOLN: INTRAVENOUS | Status: DC

## 2019-11-24 MED ORDER — AEROCHAMBER PLUS FLO-VU LARGE MISC
1.0000 | Freq: Once | Status: AC
  Administered 2019-11-24: 1

## 2019-11-24 MED ORDER — FLUTICASONE PROPIONATE 50 MCG/ACT NA SUSP
2.0000 | Freq: Every day | NASAL | Status: DC
  Administered 2019-11-25 – 2019-11-30 (×5): 2 via NASAL
  Filled 2019-11-24: qty 16

## 2019-11-24 MED ORDER — SODIUM CHLORIDE 0.9 % IV SOLN
500.0000 mg | INTRAVENOUS | Status: DC
  Administered 2019-11-24: 500 mg via INTRAVENOUS
  Filled 2019-11-24: qty 500

## 2019-11-24 MED ORDER — FENTANYL CITRATE (PF) 100 MCG/2ML IJ SOLN
50.0000 ug | Freq: Once | INTRAMUSCULAR | Status: AC
  Administered 2019-11-24: 50 ug via INTRAVENOUS
  Filled 2019-11-24: qty 2

## 2019-11-24 MED ORDER — AZITHROMYCIN 500 MG PO TABS
500.0000 mg | ORAL_TABLET | Freq: Every day | ORAL | Status: AC
  Administered 2019-11-25 – 2019-11-28 (×4): 500 mg via ORAL
  Filled 2019-11-24 (×4): qty 1
  Filled 2019-11-24: qty 2
  Filled 2019-11-24: qty 1

## 2019-11-24 MED ORDER — INSULIN ASPART 100 UNIT/ML ~~LOC~~ SOLN
0.0000 [IU] | Freq: Three times a day (TID) | SUBCUTANEOUS | Status: DC
  Administered 2019-11-24: 8 [IU] via SUBCUTANEOUS
  Administered 2019-11-24: 5 [IU] via SUBCUTANEOUS
  Administered 2019-11-25: 11 [IU] via SUBCUTANEOUS
  Administered 2019-11-25 – 2019-11-26 (×2): 5 [IU] via SUBCUTANEOUS
  Administered 2019-11-26: 3 [IU] via SUBCUTANEOUS
  Administered 2019-11-26: 15 [IU] via SUBCUTANEOUS
  Administered 2019-11-27: 3 [IU] via SUBCUTANEOUS
  Administered 2019-11-27: 5 [IU] via SUBCUTANEOUS
  Administered 2019-11-27: 11 [IU] via SUBCUTANEOUS
  Administered 2019-11-28: 15 [IU] via SUBCUTANEOUS
  Administered 2019-11-28: 2 [IU] via SUBCUTANEOUS
  Administered 2019-11-29: 5 [IU] via SUBCUTANEOUS
  Administered 2019-11-29: 8 [IU] via SUBCUTANEOUS
  Administered 2019-11-30: 5 [IU] via SUBCUTANEOUS

## 2019-11-24 MED ORDER — SODIUM CHLORIDE 0.9 % IV SOLN
2.0000 g | INTRAVENOUS | Status: DC
  Administered 2019-11-25 – 2019-11-26 (×2): 2 g via INTRAVENOUS
  Filled 2019-11-24 (×3): qty 20

## 2019-11-24 MED ORDER — PREDNISONE 20 MG PO TABS
40.0000 mg | ORAL_TABLET | Freq: Every day | ORAL | Status: DC
  Administered 2019-11-25: 40 mg via ORAL
  Filled 2019-11-24 (×2): qty 2

## 2019-11-24 MED ORDER — INSULIN ASPART 100 UNIT/ML ~~LOC~~ SOLN
0.0000 [IU] | Freq: Every day | SUBCUTANEOUS | Status: DC
  Administered 2019-11-24 – 2019-11-25 (×2): 5 [IU] via SUBCUTANEOUS
  Administered 2019-11-27: 4 [IU] via SUBCUTANEOUS
  Administered 2019-11-28: 2 [IU] via SUBCUTANEOUS
  Administered 2019-11-29: 3 [IU] via SUBCUTANEOUS

## 2019-11-24 MED ORDER — IPRATROPIUM-ALBUTEROL 0.5-2.5 (3) MG/3ML IN SOLN
3.0000 mL | Freq: Four times a day (QID) | RESPIRATORY_TRACT | Status: DC
  Administered 2019-11-24 – 2019-11-27 (×12): 3 mL via RESPIRATORY_TRACT
  Filled 2019-11-24 (×12): qty 3

## 2019-11-24 MED ORDER — AMLODIPINE BESYLATE 10 MG PO TABS
10.0000 mg | ORAL_TABLET | Freq: Every day | ORAL | Status: DC
  Administered 2019-11-24 – 2019-11-30 (×7): 10 mg via ORAL
  Filled 2019-11-24 (×4): qty 1
  Filled 2019-11-24: qty 2
  Filled 2019-11-24 (×2): qty 1

## 2019-11-24 MED ORDER — METHYLPREDNISOLONE SODIUM SUCC 125 MG IJ SOLR
125.0000 mg | Freq: Once | INTRAMUSCULAR | Status: AC
  Administered 2019-11-24: 125 mg via INTRAVENOUS
  Filled 2019-11-24: qty 2

## 2019-11-24 NOTE — ED Provider Notes (Signed)
[facility] EMERGENCY DEPARTMENT Provider Note   CSN: [id number] Arrival date & time: 11/23/19  1809     History Chief Complaint  Patient presents with  . Chest Pain  . Emesis    Shirley [name] is a 44 y.o. female.  Patient with multiple medical problems as listed below including prior MI, CAD, diabetes presents to the emergency department with a chief complaint of chest pain, shortness of breath, cough, and vomiting.  She reports that all of her symptoms started about 3 days ago.  She denies any known Covid exposures, but has not had the Covid vaccine.  She denies any recent Covid testing.  She denies any fevers or chills, but does report associated wheezing and shortness of breath with her cough.  She denies any successful treatments prior to arrival.  Exertion makes her symptoms worse.  The history is provided by the patient. No language interpreter was used.       Past Medical History:  Diagnosis Date  . Abscess of tunica vaginalis    10/09- Abundant S. aureus- sensitive to all abx  . Anxiety   . Blood dyscrasia   . CAD (coronary artery disease) 06/15/2006   s/p Subendocardial MI with PDA angioplasty(no stent) on 06/15/06 and relook  cath 06/19/06 showed patency of site. Cath 12/10- no restenosis or significant CAD progression  . CVA (cerebral vascular accident) ([place]) ~ 02/2014   denies residual on 04/22/2014  . CVA (cerebral vascular accident) [facility])    history of remote right cerebellar infarct noted on head CT at least since 10/2011  . Depression   . Diabetes mellitus type 2, uncontrolled, with complications ([place])   . Fibromyalgia   . Gastritis   . Gastroparesis    secondary to poorly controlled DM, last emptying study performed 01/2010  was normal but may be falsely positive as pt was on reglan  . GERD (gastroesophageal reflux disease)   . Hepatitis B, chronic (HCC)    Hep BeAb+,Hep B cAb+ & Hep BsAg+ (9/06)  . History of pyelonephritis    H/o GrpB  Pyelonephritis (9/06) and UTI- 07/11- E.Coli, 12/10- GBS  . Hyperlipidemia   . Hypertension   . Iron deficiency anemia   . Irregular menses    Small ovarian follicles seen on WK(0/88)  . MI (myocardial infarction) ([place]) 05/2006   PDA percutaneous transluminal coronary angioplasty  . Migraine    "weekly" (04/22/2014)  . N&V (nausea and vomiting)    Chronic. Unclear etiology with multiple admission and ED visits. CT abdomen with and without contrast (02/2011)  showed no acute process. Gastic Emptying scan (01/2010) was normal. Ultrasound of the abdomen was within normal limits. Hepatitis B viral load was undectable. HIV NR. EGD - gastritis, Hpylori + s/p Rx  . Obesity   . OSA (obstructive sleep apnea)    "suppose to wear mask but I don't" (04/22/2014)  . Peripheral neuropathy   . Pneumonia    "this is probably the 2nd or 3rd time I've had pneumonia" (04/22/2014)  . Recurrent boils   . Seasonal asthma   . Substance abuse ([place])   . Thrombocytosis ([place])    Hem/Onc suggested 2/2 chronic hepatits and/or iron deficiency anemia    Patient Active Problem List   Diagnosis Date Noted  . History of pulmonary embolism 10/10/2017  . Chronic anticoagulation 10/10/2017  . GERD (gastroesophageal reflux disease) 10/10/2017  . Leukocytosis 10/10/2017  . Prolonged QT interval 10/10/2017  . Nausea & vomiting 09/20/2017  .  Hypertensive urgency 09/20/2017  . Intractable nausea and vomiting 08/08/2017  . Chronic maxillary sinusitis 01/02/2017  . Acute blood loss anemia 11/13/2016  . Menorrhagia 11/13/2016  . Bilateral pulmonary embolism ([place]) 10/30/2016  . Acute DVT (deep venous thrombosis) ([place]) 10/30/2016  . Nausea and vomiting 09/23/2016  . Type 2 diabetes mellitus with hyperglycemia ([place]) 04/07/2016  . Diabetic gastroparesis ([place]) 04/06/2016  . Dehydration 03/27/2015  . Gastroparesis 11/11/2014  . Hematemesis 10/13/2014  . DKA (diabetic ketoacidoses) ([place]) 10/13/2014  . Abdominal pain,  chronic, left lower quadrant   . Diabetic gastroparesis associated with type 2 diabetes mellitus ([place]) 04/28/2014  . History of Helicobacter pylori infection 04/22/2014  . Vaginal discharge 02/18/2014  . Unspecified constipation 07/21/2013  . Tinea corporis 07/21/2013  . Intractable vomiting 04/29/2013  . Dysmenorrhea 04/22/2013  . UTI (urinary tract infection) 07/15/2012  . Headache(784.0) 02/08/2012  . Health care maintenance 01/22/2012  . Chronic hepatitis B ([place]) 03/07/2011  . History of leukocytosis 04/06/2010  . THROMBOCYTOSIS 04/06/2010  . Polysubstance abuse ([place]) 02/23/2010  . Iron deficiency anemia 11/22/2009  . PERIPHERAL NEUROPATHY 10/01/2009  . Hyperlipidemia 08/30/2009  . Diabetic polyneuropathy ([place]) 08/30/2009  . Hidradenitis (recurrent boils) 07/07/2008  . Depression 12/27/2007  . Abdominal pain, left lower quadrant 11/21/2007  . FIBROMYALGIA 10/30/2007  . BACK PAIN 04/01/2007  . OBSTRUCTIVE SLEEP APNEA 01/17/2007  . ANXIETY DEPRESSION 06/27/2006  . Chronic ischemic heart disease 06/15/2006  . OBESITY, MORBID 05/15/2006  . MIGRAINE HEADACHE 05/15/2006  . Asthma 05/15/2006  . Essential hypertension 01/16/2006  . IRREGULAR MENSTRUATION 01/16/2006  . PEDAL EDEMA 01/16/2006  . Poorly controlled type 2 diabetes mellitus with peripheral neuropathy ([place]) 01/16/1989    Past Surgical History:  Procedure Laterality Date  . CESAREAN SECTION  1997  . CORONARY ANGIOPLASTY WITH STENT PLACEMENT  2008   "2 stents"  . ESOPHAGOGASTRODUODENOSCOPY N/A 04/23/2014   Procedure: ESOPHAGOGASTRODUODENOSCOPY (EGD);  Surgeon: Winfield Cunas., MD;  Location: [facility] ENDOSCOPY;  Service: Endoscopy;  Laterality: N/A;  . IR ANGIOGRAM PELVIS SELECTIVE OR SUPRASELECTIVE  12/07/2016  . IR ANGIOGRAM PELVIS SELECTIVE OR SUPRASELECTIVE  12/07/2016  . IR ANGIOGRAM SELECTIVE EACH ADDITIONAL VESSEL  12/07/2016  . IR ANGIOGRAM SELECTIVE EACH ADDITIONAL VESSEL  12/07/2016  . IR EMBO ARTERIAL NOT  HEMORR HEMANG INC GUIDE ROADMAPPING  12/07/2016  . IR RADIOLOGIST EVAL & MGMT  01/09/2017  . IR US GUIDE VASC ACCESS RIGHT  12/07/2016     OB History    Gravida  2   Para  2   Term  2   Preterm      AB      Living        SAB      TAB      Ectopic      Multiple      Live Births  2           Family History  Problem Relation Age of Onset  . Diabetes Father     Social History   Tobacco Use  . Smoking status: Former Smoker    Types: Cigarettes    Quit date: 04/24/1996    Years since quitting: 23.6  . Smokeless tobacco: Never Used  . Tobacco comment: quit smoking cigarettes age 38  Vaping Use  . Vaping Use: Never used  Substance Use Topics  . Alcohol use: No    Alcohol/week: 0.0 standard drinks    Comment: 04/22/2014 "might have a few drinks a month"  . Drug use: Not  Currently    Types: Marijuana, Cocaine    Comment: 04/22/2104 "quit drugs ~ 1-2 yr ago"    Home Medications Prior to Admission medications   Medication Sig Start Date End Date Taking? Authorizing Provider  albuterol (VENTOLIN HFA) 108 (90 Base) MCG/ACT inhaler Inhale 2 puffs into the lungs every 6 (six) hours as needed for wheezing or shortness of breath. 11/28/18   Kerin Perna, NP  Blood Glucose Monitoring Suppl (ONETOUCH VERIO REFLECT) w/Device KIT 1 each by Does not apply route 3 (three) times daily. Dec 15, 2018   Kerin Perna, NP  glucose blood (ONETOUCH VERIO) test strip Use as instructed to check blood sugar 3 times daily. Dx codes: E11.8, E11.65 2018-12-15   Charlott Rakes, MD  HYDROcodone-acetaminophen (NORCO) 10-325 MG tablet Take 1 tablet by mouth every 6 (six) hours as needed. for pain 11/27/18   [provider]  Insulin Glargine (LANTUS SOLOSTAR) 100 UNIT/ML Solostar Pen Inject 20 Units into the skin daily. 11/28/18   Charlott Rakes, MD  Insulin Pen Needle (PEN NEEDLES 3/16") 31G X 5 MM MISC 1 Device by Does not apply route 4 (four) times daily - after meals and at bedtime.  11/28/18   Kerin Perna, NP  INSULIN SYRINGE .5CC/28G (INS SYRINGE/NEEDLE .5CC/28G) 28G X 1/2" 0.5 ML MISC Please provide 1 month supply Patient not taking: Reported on 01/07/2018 09/04/17   Colbert Ewing, MD  levocetirizine (XYZAL) 5 MG tablet Take 1 tablet (5 mg total) by mouth every evening. 11/28/18   Kerin Perna, NP  losartan-hydrochlorothiazide (HYZAAR) 100-25 MG tablet Take 1 tablet by mouth daily. 11/28/18   Kerin Perna, NP  OneTouch Delica Lancets 25W MISC Use to check blood sugar 3 times daily. Dx codes: E11.8, E11.65 Dec 15, 2018   Charlott Rakes, MD  penicillin v potassium (VEETID) 500 MG tablet Take 500 mg by mouth every 6 (six) hours. 11/27/18   [provider]  sitaGLIPtin (JANUVIA) 100 MG tablet Take 1 tablet (100 mg total) by mouth daily. 11/28/18   Kerin Perna, NP  traZODone (DESYREL) 50 MG tablet Take 0.5-1 tablets (25-50 mg total) by mouth at bedtime as needed for sleep. 11/28/18   Kerin Perna, NP    Allergies    Lisinopril and Morphine and related  Review of Systems   Review of Systems  All other systems reviewed and are negative.   Physical Exam Updated Vital Signs BP (!) 171/141   Pulse (!) 119   Temp 98.4 F (36.9 C) (Oral)   Resp 16   Ht _0  (1.702 m)   Wt (!) 104 kg   SpO2 99%   BMI 35.91 kg/m   Physical Exam Vitals and nursing note reviewed.  Constitutional:      General: She is not in acute distress.    Appearance: She is well-developed.  HENT:     Head: Normocephalic and atraumatic.  Eyes:     Conjunctiva/sclera: Conjunctivae normal.  Cardiovascular:     Rate and Rhythm: Regular rhythm. Tachycardia present.     Heart sounds: No murmur heard.   Pulmonary:     Effort: No respiratory distress.     Breath sounds: Wheezing present.     Comments: Mildly increased work of breathing, speaking in 3-5 word sentences, there is audible wheezing Abdominal:     Palpations: Abdomen is soft.     Tenderness: There is no  abdominal tenderness.     Comments: Generalized abdominal discomfort, but no focal tenderness  Musculoskeletal:  General: Normal range of motion.     Cervical back: Neck supple.  Skin:    General: Skin is warm and dry.  Neurological:     Mental Status: She is alert and oriented to person, place, and time.  Psychiatric:        Mood and Affect: Mood normal.        Behavior: Behavior normal.     ED Results / Procedures / Treatments   Labs (all labs ordered are listed, but only abnormal results are displayed) Labs Reviewed  BASIC METABOLIC PANEL - Abnormal; Notable for the following components:      Result Value   CO2 17 (*)    Glucose, Bld 211 (*)    Creatinine, Ser 1.64 (*)    GFR calc non Af Amer 37 (*)    GFR calc Af Amer 43 (*)    All other components within normal limits  CBC - Abnormal; Notable for the following components:   WBC 12.1 (*)    Platelets 427 (*)    All other components within normal limits  SARS CORONAVIRUS 2 BY RT PCR (HOSPITAL ORDER, [facility])  CULTURE, BLOOD (ROUTINE X 2)  CULTURE, BLOOD (ROUTINE X 2)  URINE CULTURE  LIPASE, BLOOD  URINALYSIS, ROUTINE W REFLEX MICROSCOPIC  LACTIC ACID, PLASMA  LACTIC ACID, PLASMA  COMPREHENSIVE METABOLIC PANEL  PROTIME-INR  APTT  I-STAT BETA HCG BLOOD, ED ([facility], [facility], [facility] ONLY)  TROPONIN I (HIGH SENSITIVITY)  TROPONIN I (HIGH SENSITIVITY)    EKG None  Radiology DG Chest 2 View  Result Date: 11/23/2019 CLINICAL DATA:  Chest pain.  Vomiting. EXAM: CHEST - 2 VIEW COMPARISON:  Radiograph 02/15/2019 FINDINGS: Lung volumes are low. Patchy bilateral lower lung zone airspace opacities. Stable upper normal heart size. Unchanged mediastinal contours. No pleural effusion or pneumothorax. No acute osseous abnormalities are seen. IMPRESSION: Low lung volumes with patchy bilateral lower lung zone airspace opacities, suspicious for pneumonia, particularly on the right. In the setting of vomiting,  aspiration is considered. Electronically Signed   By: Keith Rake M.D.   On: 11/23/2019 19:00    Procedures .Critical Care Performed by: Montine Circle, PA-C Authorized by: Montine Circle, PA-C   Critical care provider statement:    Critical care time (minutes):  40   Critical care was necessary to treat or prevent imminent or life-threatening deterioration of the following conditions:  Sepsis   Critical care was time spent personally by me on the following activities:  Discussions with consultants, evaluation of patient's response to treatment, examination of patient, ordering and performing treatments and interventions, ordering and review of laboratory studies, ordering and review of radiographic studies, pulse oximetry, re-evaluation of patient's condition, obtaining history from patient or surrogate and review of old charts   (including critical care time)  Medications Ordered in ED Medications  AeroChamber Plus Flo-Vu Large MISC 1 each (has no administration in time range)  albuterol (VENTOLIN HFA) 108 (90 Base) MCG/ACT inhaler 2 puff (has no administration in time range)  lactated ringers infusion (has no administration in time range)  cefTRIAXone (ROCEPHIN) 2 g in sodium chloride 0.9 % 100 mL IVPB (has no administration in time range)  azithromycin (ZITHROMAX) 500 mg in sodium chloride 0.9 % 250 mL IVPB (has no administration in time range)  ondansetron (ZOFRAN-ODT) disintegrating tablet 4 mg (4 mg Oral Given 11/23/19 1833)    ED Course  I have reviewed the triage vital signs and the nursing notes.  Pertinent labs & imaging results that were available during my care of the patient were reviewed by me and considered in my medical decision making (see chart for details).    MDM Rules/Calculators/A&P                          This patient complains of cough, vomiting, shortness of breath and chest pain, this involves an extensive number of treatment options, and is a  complaint that carries with it a high risk of complications and morbidity.  The differential diagnosis includes pneumonia, Covid, ACS, PE, aspiration.  Pertinent Labs I ordered, reviewed, and interpreted labs, which included leukocytosis to 12.1.  Creatinine is 1.64, increased from 2020.  Troponin initially is 15, second troponin is 17, ACS thought to be less likely.  PE is considered, but based on patient's cough and wheezing and x-ray findings, feel that pneumonia is more likely.  hCG is negative.  Covid test is pending.  Lactic and blood cultures are pending.  Imaging Interpretation I ordered imaging studies which included chest x-ray.  I independently visualized and interpreted the chest x-ray, which showed opacity concerning for pneumonia.   Medications I ordered medication lactated Ringer's, rocephin, azithromycin, and Ativan for sepsis criteria, pneumonia, and vomiting.  Patient has history of prolonged QT syndrome, therefore Ativan ordered for nausea.  She was also given 2 puffs of albuterol inhaler for her wheezing.  Sources  Previous records obtained and reviewed multiple admissions back in 2019 for nausea and vomiting.  Consultants Dr. Cyd Silence, who is greatly appreciated for admitting the patient.  States that patient will likely be seen by the morning team.   Reassessments After the interventions stated above, I reevaluated the patient and found no longer retching after Ativan.  She is still wheezy and short of breath.  Covid test is pending.  This patient presenting with cough, shortness of breath, chest pain, and vomiting is to be admitted based on her chest x-ray finding showing infiltrate, could be Covid versus bacterial versus aspiration.  She has lower O2 level on room air, 93%, heart rate 125.  She is having mild respiratory distress with increased work of breathing and speaking in 3-5 word sentences.  I do not believe that she would do well at home.  Final Clinical  Impression(s) / ED Diagnoses Final diagnoses:  Community acquired pneumonia of right lower lobe of lung  Nausea and vomiting, intractability of vomiting not specified, unspecified vomiting type  Chest pain, unspecified type    Rx / DC Orders ED Discharge Orders    None       Montine Circle, PA-C 11/24/19 [id number]    Veryl Speak, MD 11/24/19 [phone]

## 2019-11-24 NOTE — ED Notes (Signed)
Pt's O2 dropped to 78%. Informed Dr. Gilford Raid. Pt placed on 2L of O2. Informed [place] - RN.

## 2019-11-24 NOTE — ED Notes (Signed)
Pt Spo2 remained about 95% on RA, Pt is however, very weak on her feet and dyspneic on exertion.

## 2019-11-24 NOTE — ED Notes (Signed)
Attempted report x1. 

## 2019-11-24 NOTE — H&P (Signed)
History and Physical    [name]:[id number] DOB: 10/07/1975 DOA: 11/23/2019  Referring MD/NP/PA: Shela Leff, MD PCP: Fredrich Romans, [place]  Patient coming from: Home  Chief Complaint: Chest pain, abdominal pain, cough, nausea, and vomiting  I have personally briefly reviewed patient's old medical records in [place]   HPI: Shirley [name] is a 44 y.o. female with medical history significant of HTN, HLD, CAD, CVA, DM type II uncontrolled with complications of peripheral neuropathy and gastroparesis, hepatitis B, PE/DVT, OSA, and obesity presents with multiple complaints including chest pain, abdominal pain, cough, nausea, and vomiting for 3 days.  She reports having substernal chest pain.  Notes epigastric and left sided abdominal pain with nausea and vomiting.  Emesis has been nonbloody in appearance.  She reports having similar symptoms in the past associated with a history of gastroparesis, but states that the symptoms are worse.  She has had a productive cough with shortness of breath and wheezing.  Tried Mucinex and over-the-counter medicines without relief in symptoms.  Patient denies smoking cigarettes, but does occasionally smoke marijuana.  Associated symptoms include chills and urinary frequency but had not checked her temperature at home.  Denies any recent sick contacts.  She has not received a COVID-19 vaccine.  Patient states that she has not sniffed cocaine in over a month and occasionally uses marijuana.  ED Course: On admission patient was seen to be afebrile with pulse up to 125, respirations 16-22, blood pressures 137/72-183/112, and O2 saturations currently maintained on room air.  Labs significant for WBC 12.1, platelets 427, BUN 18-> 28, creatinine 1.64-> 2.13, glucose 211->240, lactic acid 1.9, and troponins negative x2.  Chest x-ray revealed low lung volumes with patchy bilateral infiltrates worse on the right.  Blood cultures were obtained.  Patient was  given lactated Ringer's at 150 mL/h, albuterol inhaler, Ativan 1 mg, Zofran, Rocephin, and azithromycin.  COVID-19 screening recently came back negative.  Review of Systems  Constitutional: Positive for chills and malaise/fatigue. Negative for fever.  HENT: Positive for congestion. Negative for nosebleeds.   Respiratory: Positive for cough, shortness of breath and wheezing.   Cardiovascular: Positive for chest pain. Negative for leg swelling.  Gastrointestinal: Positive for abdominal pain, nausea and vomiting.  Genitourinary: Positive for frequency.  Skin: Negative for itching.  Neurological: Positive for weakness. Negative for loss of consciousness.  Psychiatric/Behavioral: Positive for substance abuse.    Past Medical History:  Diagnosis Date  . Abscess of tunica vaginalis    10/09- Abundant S. aureus- sensitive to all abx  . Anxiety   . Blood dyscrasia   . CAD (coronary artery disease) 06/15/2006   s/p Subendocardial MI with PDA angioplasty(no stent) on 06/15/06 and relook  cath 06/19/06 showed patency of site. Cath 12/10- no restenosis or significant CAD progression  . CVA (cerebral vascular accident) ([place]) ~ 02/2014   denies residual on 04/22/2014  . CVA (cerebral vascular accident) [facility])    history of remote right cerebellar infarct noted on head CT at least since 10/2011  . Depression   . Diabetes mellitus type 2, uncontrolled, with complications ([place])   . Fibromyalgia   . Gastritis   . Gastroparesis    secondary to poorly controlled DM, last emptying study performed 01/2010  was normal but may be falsely positive as pt was on reglan  . GERD (gastroesophageal reflux disease)   . Hepatitis B, chronic (HCC)    Hep BeAb+,Hep B cAb+ & Hep BsAg+ (9/06)  . History  of pyelonephritis    H/o GrpB Pyelonephritis (9/06) and UTI- 07/11- E.Coli, 12/10- GBS  . Hyperlipidemia   . Hypertension   . Iron deficiency anemia   . Irregular menses    Small ovarian follicles seen on BZ(1/69)  .  MI (myocardial infarction) ([place]) 05/2006   PDA percutaneous transluminal coronary angioplasty  . Migraine    "weekly" (04/22/2014)  . N&V (nausea and vomiting)    Chronic. Unclear etiology with multiple admission and ED visits. CT abdomen with and without contrast (02/2011)  showed no acute process. Gastic Emptying scan (01/2010) was normal. Ultrasound of the abdomen was within normal limits. Hepatitis B viral load was undectable. HIV NR. EGD - gastritis, Hpylori + s/p Rx  . Obesity   . OSA (obstructive sleep apnea)    "suppose to wear mask but I don't" (04/22/2014)  . Peripheral neuropathy   . Pneumonia    "this is probably the 2nd or 3rd time I've had pneumonia" (04/22/2014)  . Recurrent boils   . Seasonal asthma   . Substance abuse ([place])   . Thrombocytosis ([place])    Hem/Onc suggested 2/2 chronic hepatits and/or iron deficiency anemia    Past Surgical History:  Procedure Laterality Date  . CESAREAN SECTION  1997  . CORONARY ANGIOPLASTY WITH STENT PLACEMENT  2008   "2 stents"  . ESOPHAGOGASTRODUODENOSCOPY N/A 04/23/2014   Procedure: ESOPHAGOGASTRODUODENOSCOPY (EGD);  Surgeon: Winfield Cunas., MD;  Location[facility] ENDOSCOPY;  Service: Endoscopy;  Laterality: N/A;  . IR ANGIOGRAM PELVIS SELECTIVE OR SUPRASELECTIVE  12/07/2016  . IR ANGIOGRAM PELVIS SELECTIVE OR SUPRASELECTIVE  12/07/2016  . IR ANGIOGRAM SELECTIVE EACH ADDITIONAL VESSEL  12/07/2016  . IR ANGIOGRAM SELECTIVE EACH ADDITIONAL VESSEL  12/07/2016  . IR EMBO ARTERIAL NOT HEMORR HEMANG INC GUIDE ROADMAPPING  12/07/2016  . IR RADIOLOGIST EVAL & MGMT  01/09/2017  . IR US GUIDE VASC ACCESS RIGHT  12/07/2016     reports that she quit smoking about 23 years ago. Her smoking use included cigarettes. She has never used smokeless tobacco. She reports previous drug use. Drugs: Marijuana and Cocaine. She reports that she does not drink alcohol.  Allergies  Allergen Reactions  . Lisinopril Nausea Only and Swelling  . Morphine And  Related Itching and Swelling    Family History  Problem Relation Age of Onset  . Diabetes Father     Prior to Admission medications   Medication Sig Start Date End Date Taking? Authorizing Provider  albuterol (VENTOLIN HFA) 108 (90 Base) MCG/ACT inhaler Inhale 2 puffs into the lungs every 6 (six) hours as needed for wheezing or shortness of breath. 11/28/18   Kerin Perna, NP  Blood Glucose Monitoring Suppl (ONETOUCH VERIO REFLECT) w/Device KIT 1 each by Does not apply route 3 (three) times daily. 12/31/18   Kerin Perna, NP  glucose blood (ONETOUCH VERIO) test strip Use as instructed to check blood sugar 3 times daily. Dx codes: E11.8, E11.65 December 31, 2018   Charlott Rakes, MD  HYDROcodone-acetaminophen (NORCO) 10-325 MG tablet Take 1 tablet by mouth every 6 (six) hours as needed. for pain 11/27/18   [provider]  Insulin Glargine (LANTUS SOLOSTAR) 100 UNIT/ML Solostar Pen Inject 20 Units into the skin daily. 11/28/18   Charlott Rakes, MD  Insulin Pen Needle (PEN NEEDLES 3/16") 31G X 5 MM MISC 1 Device by Does not apply route 4 (four) times daily - after meals and at bedtime. 11/28/18   Kerin Perna, NP  INSULIN SYRINGE .5CC/28G (  INS SYRINGE/NEEDLE .5CC/28G) 28G X 1/2" 0.5 ML MISC Please provide 1 month supply Patient not taking: Reported on 01/07/2018 09/04/17   Colbert Ewing, MD  levocetirizine (XYZAL) 5 MG tablet Take 1 tablet (5 mg total) by mouth every evening. 11/28/18   Kerin Perna, NP  losartan-hydrochlorothiazide (HYZAAR) 100-25 MG tablet Take 1 tablet by mouth daily. 11/28/18   Kerin Perna, NP  OneTouch Delica Lancets 32N MISC Use to check blood sugar 3 times daily. Dx codes: E11.8, E11.65 06-Dec-2018   Charlott Rakes, MD  penicillin v potassium (VEETID) 500 MG tablet Take 500 mg by mouth every 6 (six) hours. 11/27/18   [provider]  sitaGLIPtin (JANUVIA) 100 MG tablet Take 1 tablet (100 mg total) by mouth daily. 11/28/18   Kerin Perna, NP   traZODone (DESYREL) 50 MG tablet Take 0.5-1 tablets (25-50 mg total) by mouth at bedtime as needed for sleep. 11/28/18   Kerin Perna, NP    Physical Exam:  Constitutional: Middle-aged female who appears acutely ill and shaky in bed Vitals:   11/23/19 2213 11/24/19 0205 11/24/19 0545 11/24/19 0630  BP: (!) 142/110 (!) 171/141 (!) 183/112 (!) 141/97  Pulse: (!) 27 (!) 119 (!) 125 (!) 113  Resp: _0 Temp: 98.4 F (36.9 C)     TempSrc: Oral     SpO2: 100% 99% 93% 94%  Weight:      Height:       Eyes: PERRL, lids and conjunctivae normal ENMT: Mucous membranes are dry. Posterior pharynx clear of any exudate or lesions.  Neck: normal, supple, no masses, no thyromegaly Respiratory: Tachypneic with rhonchi and expiratory wheezes appreciated in both lung fields. Cardiovascular: Tachycardic, no murmurs / rubs / gallops. No extremity edema. 2+ pedal pulses. No carotid bruits.  Abdomen: no tenderness, no masses palpated. No hepatosplenomegaly. Bowel sounds positive.  Musculoskeletal: no clubbing / cyanosis. No joint deformity upper and lower extremities. Good ROM, no contractures. Normal muscle tone.  Skin: no rashes, lesions, ulcers. No induration Neurologic: CN 2-12 grossly intact. Sensation intact, DTR normal. Strength 5/5 in all 4.  Psychiatric: Normal judgment and insight. Alert and oriented x 3. Normal mood.     Labs on Admission: I have personally reviewed following labs and imaging studies  CBC: Recent Labs  Lab 11/23/19 1852  WBC 12.1*  HGB 14.3  HCT 41.3  MCV 81.9  PLT 191*   Basic Metabolic Panel: Recent Labs  Lab 11/23/19 1852 11/24/19 0535  NA 136 136  K 4.1 4.1  CL 104 101  CO2 17* 19*  GLUCOSE 211* 240*  BUN 18 28*  CREATININE 1.64* 2.13*  CALCIUM 9.2 9.6   GFR: Estimated Creatinine Clearance: 41 mL/min (A) (by C-G formula based on SCr of 2.13 mg/dL (H)). Liver Function Tests: Recent Labs  Lab 11/24/19 0535  AST 24  ALT 12  ALKPHOS  63  BILITOT 1.4*  PROT 8.6*  ALBUMIN 3.1*   Recent Labs  Lab 11/23/19 1852  LIPASE 19   No results for input(s): AMMONIA in the last 168 hours. Coagulation Profile: Recent Labs  Lab 11/24/19 0535  INR 1.1   Cardiac Enzymes: No results for input(s): CKTOTAL, CKMB, CKMBINDEX, TROPONINI in the last 168 hours. BNP (last 3 results) No results for input(s): PROBNP in the last 8760 hours. HbA1C: No results for input(s): HGBA1C in the last 72 hours. CBG: No results for input(s): GLUCAP in the last 168 hours. Lipid Profile: No results for  input(s): CHOL, HDL, LDLCALC, TRIG, CHOLHDL, LDLDIRECT in the last 72 hours. Thyroid Function Tests: No results for input(s): TSH, T4TOTAL, FREET4, T3FREE, THYROIDAB in the last 72 hours. Anemia Panel: No results for input(s): VITAMINB12, FOLATE, FERRITIN, TIBC, IRON, RETICCTPCT in the last 72 hours. Urine analysis:    Component Value Date/Time   COLORURINE YELLOW 01/09/2019 2212   APPEARANCEUR CLOUDY (A) 01/09/2019 2212   LABSPEC 1.012 01/09/2019 2212   PHURINE 7.0 01/09/2019 2212   GLUCOSEU >=500 (A) 01/09/2019 2212   GLUCOSEU > 1000 mg/dL (A) 07/07/2008 2115   HGBUR NEGATIVE 01/09/2019 2212   HGBUR negative 08/12/2008 1444   BILIRUBINUR NEGATIVE 01/09/2019 2212   BILIRUBINUR neg 02/07/2017 1609   KETONESUR NEGATIVE 01/09/2019 2212   PROTEINUR 100 (A) 01/09/2019 2212   UROBILINOGEN 1.0 02/07/2017 1609   UROBILINOGEN 0.2 11/11/2014 1658   NITRITE NEGATIVE 01/09/2019 2212   LEUKOCYTESUR NEGATIVE 01/09/2019 2212   Sepsis Labs: Recent Results (from the past 240 hour(s))  SARS Coronavirus 2 by RT PCR (hospital order, performed in [place] hospital lab) Nasopharyngeal Nasopharyngeal Swab     Status: None   Collection Time: 11/24/19  5:43 AM   Specimen: Nasopharyngeal Swab  Result Value Ref Range Status   SARS Coronavirus 2 NEGATIVE NEGATIVE Final    Comment: (NOTE) SARS-CoV-2 target nucleic acids are NOT DETECTED.  The SARS-CoV-2  RNA is generally detectable in upper and lower respiratory specimens during the acute phase of infection. The lowest concentration of SARS-CoV-2 viral copies this assay can detect is 250 copies / mL. A negative result does not preclude SARS-CoV-2 infection and should not be used as the sole basis for treatment or other patient management decisions.  A negative result may occur with improper specimen collection / handling, submission of specimen other than nasopharyngeal swab, presence of viral mutation(s) within the areas targeted by this assay, and inadequate number of viral copies (<250 copies / mL). A negative result must be combined with clinical observations, patient history, and epidemiological information.  Fact Sheet for Patients:   StrictlyIdeas.no  Fact Sheet for Healthcare Providers: BankingDealers.co.za  This test is not yet approved or  cleared by the [place] FDA and has been authorized for detection and/or diagnosis of SARS-CoV-2 by FDA under an Emergency Use Authorization (EUA).  This EUA will remain in effect (meaning this test can be used) for the duration of the COVID-19 declaration under Section 564(b)(1) of the Act, 21 U.S.C. section 360bbb-3(b)(1), unless the authorization is terminated or revoked sooner.  Performed at [facility], [place] [address] [place], [place] [address]      Radiological Exams on Admission: DG Chest 2 View  Result Date: 11/23/2019 CLINICAL DATA:  Chest pain.  Vomiting. EXAM: CHEST - 2 VIEW COMPARISON:  Radiograph 02/15/2019 FINDINGS: Lung volumes are low. Patchy bilateral lower lung zone airspace opacities. Stable upper normal heart size. Unchanged mediastinal contours. No pleural effusion or pneumothorax. No acute osseous abnormalities are seen. IMPRESSION: Low lung volumes with patchy bilateral lower lung zone airspace opacities, suspicious for pneumonia, particularly on the right.  In the setting of vomiting, aspiration is considered. Electronically Signed   By: Keith Rake M.D.   On: 11/23/2019 19:00    EKG: Independently reviewed.  Sinus tachycardia 124 bpm  Assessment/Plan Sepsis secondary to pneumonia: Acute.  Patient presented with complaints of chest pain, shortness of breath, cough, nausea, and vomiting.  O2 saturation currently maintained on room air.  Chest x-ray significant for bilateral opacities concerning for  pneumonia.  COVID-19 screening was negative..  Question possibility also of aspiration with nausea and vomiting symptom versus infection versus related with cocaine use. -Admit to a medical telemetry bed -Pneumonia order set utilized -Aspiration precaution with elevation of the head of the bed -Follow-up blood and sputum cultures -Check D-dimer -Continue azithromycin and Rocephin -Recheck CBC in a.m  Chest and abdominal pain: Acute.  Patient reports having significant chest and abdominal pain.  Prior history of chronic high-sensitivity troponin negative x2 and EKG without significant ischemic changes.  Exact cause of patient's symptoms not totally clear at this time. -Follow-up pending studies  Acute kidney injury superimposed on chronic kidney disease stage IIIa: Upon  admission into the emergency department creatinine elevated at 2.13 with BUN 28.  Previous baseline creatinine noted to be around 1.1-1.2.  Suspect prerenal cause of symptoms with complaints of nausea and vomiting. -Continue IV fluids as tolerated -Check CK -Check urine sodium and urine creatinine -Avoid nephrotoxic agents  Nausea and vomiting Gastroparesis: Acute on chronic.  Patient with prior history  gastroparesis which could be contributing to her nausea, vomiting, and abdominal symptoms, but also question possibility of recent substance abuse -N.p.o. for now and advance diet as tolerated -Continue IV fluids  -Protonix IV  Asthma exacerbation vs. bronchitis: Acute.   Patient noted to have expiratory wheezing on physical exam. -DuoNebs 4 times daily. -Solu-Medrol 125 mg IV x1 dose, then prednisone 40 mg daily  Diabetes mellitus type 2, uncontrolled: Patient presents with glucose elevated up to 240 with signs of anion gap.  Review of records last hemoglobin A1c 13.3 in 2020.    Home medications include Amaryl 4 mg daily, Trulicity 3 mg once a week x4 weeks to transition to 4.5 mg q. weekly, Lantus 27 units daily, Humalog 7 units before meals and at bedtime.  -Hypoglycemic protocols -Check hemoglobin A1c -Hold Amaryl -Continue home regimen of Lantus -CBGs before every meal and nightly with moderate SSI -Diabetic education consult  Hypertension urgency: Acute.  Initial blood pressure was elevated up to 183/112.  Home medications include amlodipine 10 mg daily and losartan 100 mg daily. -Hydralazine IV as needed as needed for elevated blood pressures -Hold losartan due to AKI -Continue amlodipine when able to tolerate p.o.  Thrombocytosis/leukocytosis: Acute on chronic.  WBC and platelet count elevated, but both appear to be chronically elevated.  Symptoms could just be reactive in nature, but may warrant further investigation or referral in the outpatient setting. -Continue to monitor  Polysubstance abuse: Patient with a significant past medical history of cocaine and marijuana abuse.  She reports last using cocaine over a month ago and only intermittently smokes marijuana.  However, question of symptoms of nausea vomiting and chest pain are secondary to more recent use. -Check urine drug screen  Chronic pain syndrome: Patient treated chronic abdominal pain on oxycodone 10-325 mg every 4 hours as needed for pain. -IV fentanyl as needed for pain  Hyperlipidemia: Home medications include pravastatin 80 mg daily. -Continue pravastatin  History of hepatitis B, chronic  DVT prophylaxis: Heparin Code Status: Full Family Communication: No family requested  to be updated at this time Disposition Plan: Likely discharge home once medically stable Consults called: None Admission status: Inpatient  Norval Morton MD [facility] Pager [phone]   If 7PM-7AM, please contact night-coverage [email or web address] Password [email or web address]  11/24/2019, 7:25 AM

## 2019-11-24 NOTE — ED Notes (Signed)
Pure wick has been placed. Suction set to 45mmHg.  

## 2019-11-24 NOTE — ED Notes (Signed)
This RN was informed pt's oxygen saturation dropped into the low 80's high 70's. Pt was placed on 2L [place] with oxygen saturation now 98%. This RN messaged Dr. Tamala Julian to notify. Will continue to monitor pt.

## 2019-11-24 NOTE — ED Notes (Signed)
Witnessed 1.5 ml (75 mcg) of fentaNYL wasted with April Oakley RN.

## 2019-11-24 NOTE — ED Notes (Signed)
Pt is a difficult stick, per orders, will not delay abx.

## 2019-11-25 ENCOUNTER — Inpatient Hospital Stay (HOSPITAL_COMMUNITY): Payer: Medicare Other

## 2019-11-25 ENCOUNTER — Encounter (HOSPITAL_COMMUNITY): Payer: Medicare Other

## 2019-11-25 LAB — BASIC METABOLIC PANEL
Anion gap: 10 (ref 5–15)
BUN: 33 mg/dL — ABNORMAL HIGH (ref 6–20)
CO2: 22 mmol/L (ref 22–32)
Calcium: 8.7 mg/dL — ABNORMAL LOW (ref 8.9–10.3)
Chloride: 101 mmol/L (ref 98–111)
Creatinine, Ser: 1.84 mg/dL — ABNORMAL HIGH (ref 0.44–1.00)
GFR calc Af Amer: 37 mL/min — ABNORMAL LOW (ref >60)
GFR calc non Af Amer: 32 mL/min — ABNORMAL LOW (ref >60)
Glucose, Bld: 339 mg/dL — ABNORMAL HIGH (ref 70–99)
Potassium: 3.6 mmol/L (ref 3.5–5.1)
Sodium: 133 mmol/L — ABNORMAL LOW (ref 135–145)

## 2019-11-25 LAB — CREATININE, URINE, RANDOM: Creatinine, Urine: 203.84 mg/dL

## 2019-11-25 LAB — RAPID URINE DRUG SCREEN, HOSP PERFORMED
Amphetamines: NOT DETECTED
Barbiturates: NOT DETECTED
Benzodiazepines: NOT DETECTED
Cocaine: POSITIVE — AB
Opiates: NOT DETECTED
Tetrahydrocannabinol: NOT DETECTED

## 2019-11-25 LAB — URINALYSIS, ROUTINE W REFLEX MICROSCOPIC
Bilirubin Urine: NEGATIVE
Glucose, UA: >=500 mg/dL — AB
Ketones, ur: 5 mg/dL — AB
Leukocytes,Ua: NEGATIVE
Nitrite: NEGATIVE
Protein, ur: >=300 mg/dL — AB
Specific Gravity, Urine: 1.023 (ref 1.005–1.030)
pH: 5 (ref 5.0–8.0)

## 2019-11-25 LAB — SODIUM, URINE, RANDOM: Sodium, Ur: <10 mmol/L

## 2019-11-25 LAB — CBC
HCT: 36.4 % (ref 36.0–46.0)
Hemoglobin: 12.6 g/dL (ref 12.0–15.0)
MCH: 27.3 pg (ref 26.0–34.0)
MCHC: 34.6 g/dL (ref 30.0–36.0)
MCV: 79 fL — ABNORMAL LOW (ref 80.0–100.0)
Platelets: 390 10*3/uL (ref 150–400)
RBC: 4.61 MIL/uL (ref 3.87–5.11)
RDW: 13.7 % (ref 11.5–15.5)
WBC: 14.9 10*3/uL — ABNORMAL HIGH (ref 4.0–10.5)
nRBC: 0 % (ref 0.0–0.2)

## 2019-11-25 LAB — STREP PNEUMONIAE URINARY ANTIGEN: Strep Pneumo Urinary Antigen: NEGATIVE

## 2019-11-25 LAB — HEPARIN LEVEL (UNFRACTIONATED): Heparin Unfractionated: 0.84 IU/mL — ABNORMAL HIGH (ref 0.30–0.70)

## 2019-11-25 LAB — GLUCOSE, CAPILLARY
Glucose-Capillary: 372 mg/dL — ABNORMAL HIGH (ref 70–99)
Glucose-Capillary: 372 mg/dL — ABNORMAL HIGH (ref 70–99)
Glucose-Capillary: 256 mg/dL — ABNORMAL HIGH (ref 70–99)
Glucose-Capillary: 430 mg/dL — ABNORMAL HIGH (ref 70–99)
Glucose-Capillary: 389 mg/dL — ABNORMAL HIGH (ref 70–99)

## 2019-11-25 MED ORDER — HEPARIN BOLUS VIA INFUSION
2500.0000 [IU] | Freq: Once | INTRAVENOUS | Status: DC
  Filled 2019-11-25: qty 2500

## 2019-11-25 MED ORDER — HEPARIN (PORCINE) 25000 UT/250ML-% IV SOLN
1250.0000 [IU]/h | INTRAVENOUS | Status: DC
  Administered 2019-11-25: 1400 [IU]/h via INTRAVENOUS
  Administered 2019-11-26: 1250 [IU]/h via INTRAVENOUS
  Filled 2019-11-25 (×2): qty 250

## 2019-11-25 MED ORDER — ENSURE MAX PROTEIN PO LIQD
11.0000 [oz_av] | Freq: Three times a day (TID) | ORAL | Status: DC
  Filled 2019-11-25 (×17): qty 330

## 2019-11-25 MED ORDER — PREDNISONE 20 MG PO TABS
30.0000 mg | ORAL_TABLET | Freq: Every day | ORAL | Status: DC
  Administered 2019-11-26: 30 mg via ORAL
  Filled 2019-11-25: qty 1

## 2019-11-25 MED ORDER — INSULIN ASPART 100 UNIT/ML ~~LOC~~ SOLN
12.0000 [IU] | Freq: Once | SUBCUTANEOUS | Status: AC
  Administered 2019-11-25: 12 [IU] via SUBCUTANEOUS

## 2019-11-25 MED ORDER — TECHNETIUM TO 99M ALBUMIN AGGREGATED
4.1000 | Freq: Once | INTRAVENOUS | Status: AC | PRN
  Administered 2019-11-25: 4.1 via INTRAVENOUS

## 2019-11-25 MED ORDER — HEPARIN BOLUS VIA INFUSION
2000.0000 [IU] | Freq: Once | INTRAVENOUS | Status: AC
  Administered 2019-11-25: 2000 [IU] via INTRAVENOUS
  Filled 2019-11-25: qty 2000

## 2019-11-25 MED ORDER — INSULIN ASPART 100 UNIT/ML ~~LOC~~ SOLN
3.0000 [IU] | Freq: Three times a day (TID) | SUBCUTANEOUS | Status: DC
  Administered 2019-11-25 – 2019-11-26 (×3): 3 [IU] via SUBCUTANEOUS

## 2019-11-25 NOTE — Progress Notes (Addendum)
ANTICOAGULATION CONSULT NOTE - Initial Consult  Pharmacy Consult for Heparin  Indication: pulmonary embolus  Allergies  Allergen Reactions  . Lisinopril Nausea Only and Swelling  . Morphine And Related Itching and Swelling    Patient Measurements: Height: '5\' 7"'$  (170.2 cm) Weight: (!) 104 kg (229 lb 4.5 oz) IBW/kg (Calculated) : 61.6 Heparin Dosing Weight: 85.1 kg  Vital Signs: Temp: 97.7 F (36.5 C) (08/03 2055) Temp Source: Oral (08/03 2055) BP: 136/89 (08/03 2055) Pulse Rate: 97 (08/03 2055)  Labs: Recent Labs    11/23/19 1852 11/23/19 2213 11/24/19 0535 11/24/19 1126 11/25/19 0349 11/25/19 2203  HGB 14.3  --   --   --  12.6  --   HCT 41.3  --   --   --  36.4  --   PLT 427*  --   --   --  390  --   APTT  --   --  24  --   --   --   LABPROT  --   --  13.6  --   --   --   INR  --   --  1.1  --   --   --   HEPARINUNFRC  --   --   --   --   --  0.84*  CREATININE 1.64*  --  2.13*  --  1.84*  --   CKTOTAL  --   --   --  477*  --   --   TROPONINIHS 15 17  --   --   --   --     Estimated Creatinine Clearance: 47.4 mL/min (A) (by C-G formula based on SCr of 1.84 mg/dL (H)).   Medical History: Past Medical History:  Diagnosis Date  . Abscess of tunica vaginalis    10/09- Abundant S. aureus- sensitive to all abx  . Anxiety   . Blood dyscrasia   . CAD (coronary artery disease) 06/15/2006   s/p Subendocardial MI with PDA angioplasty(no stent) on 06/15/06 and relook  cath 06/19/06 showed patency of site. Cath 12/10- no restenosis or significant CAD progression  . CVA (cerebral vascular accident) ([place]) ~ 02/2014   denies residual on 04/22/2014  . CVA (cerebral vascular accident) [facility])    history of remote right cerebellar infarct noted on head CT at least since 10/2011  . Depression   . Diabetes mellitus type 2, uncontrolled, with complications ([place])   . Fibromyalgia   . Gastritis   . Gastroparesis    secondary to poorly controlled DM, last emptying study performed  01/2010  was normal but may be falsely positive as pt was on reglan  . GERD (gastroesophageal reflux disease)   . Hepatitis B, chronic (HCC)    Hep BeAb+,Hep B cAb+ & Hep BsAg+ (9/06)  . History of pyelonephritis    H/o GrpB Pyelonephritis (9/06) and UTI- 07/11- E.Coli, 12/10- GBS  . Hyperlipidemia   . Hypertension   . Iron deficiency anemia   . Irregular menses    Small ovarian follicles seen on ZO(1/09)  . MI (myocardial infarction) ([place]) 05/2006   PDA percutaneous transluminal coronary angioplasty  . Migraine    "weekly" (04/22/2014)  . N&V (nausea and vomiting)    Chronic. Unclear etiology with multiple admission and ED visits. CT abdomen with and without contrast (02/2011)  showed no acute process. Gastic Emptying scan (01/2010) was normal. Ultrasound of the abdomen was within normal limits. Hepatitis B viral load was undectable. HIV NR. EGD -  gastritis, Hpylori + s/p Rx  . Obesity   . OSA (obstructive sleep apnea)    "suppose to wear mask but I don't" (04/22/2014)  . Peripheral neuropathy   . Pneumonia    "this is probably the 2nd or 3rd time I've had pneumonia" (04/22/2014)  . Recurrent boils   . Seasonal asthma   . Substance abuse ([place])   . Thrombocytosis ([place])    Hem/Onc suggested 2/2 chronic hepatits and/or iron deficiency anemia    Medications:  Medications Prior to Admission  Medication Sig Dispense Refill Last Dose  . albuterol (VENTOLIN HFA) 108 (90 Base) MCG/ACT inhaler Inhale 2 puffs into the lungs every 6 (six) hours as needed for wheezing or shortness of breath. 6.7 g 1   . amitriptyline (ELAVIL) 100 MG tablet Take 100 mg by mouth daily.      Marland Kitchen amLODipine (NORVASC) 10 MG tablet Take 10 mg by mouth daily.     . Blood Glucose Monitoring Suppl (ONETOUCH VERIO REFLECT) w/Device KIT 1 each by Does not apply route 3 (three) times daily. 1 kit 0   . cyclobenzaprine (FLEXERIL) 5 MG tablet Take 5-10 mg by mouth 3 (three) times daily as needed for muscle spasms.     .  fluticasone (FLONASE) 50 MCG/ACT nasal spray Place 2 sprays into both nostrils daily.     Marland Kitchen gabapentin (NEURONTIN) 600 MG tablet Take 600 mg by mouth 2 (two) times daily.     Marland Kitchen glimepiride (AMARYL) 4 MG tablet Take 4 mg by mouth daily.     Marland Kitchen glucose blood (ONETOUCH VERIO) test strip Use as instructed to check blood sugar 3 times daily. Dx codes: E11.8, E11.65 100 each 11   . HUMALOG KWIKPEN 100 UNIT/ML KwikPen Inject 7 Units into the skin 4 (four) times daily -  before meals and at bedtime. Hold if blood glucose is less than 110     . hydrOXYzine (ATARAX/VISTARIL) 50 MG tablet Take 25-50 mg by mouth at bedtime as needed for anxiety.      . Insulin Glargine (LANTUS SOLOSTAR) 100 UNIT/ML Solostar Pen Inject 20 Units into the skin daily. (Patient taking differently: Inject 27 Units into the skin daily. ) 6 mL 2   . Insulin Pen Needle (PEN NEEDLES 3/16") 31G X 5 MM MISC 1 Device by Does not apply route 4 (four) times daily - after meals and at bedtime. 100 each 5   . INSULIN SYRINGE .5CC/28G (INS SYRINGE/NEEDLE .5CC/28G) 28G X 1/2" 0.5 ML MISC Please provide 1 month supply 100 each 0   . levocetirizine (XYZAL) 5 MG tablet Take 1 tablet (5 mg total) by mouth every evening. 30 tablet 11   . losartan (COZAAR) 100 MG tablet Take 100 mg by mouth daily.     Marland Kitchen losartan-hydrochlorothiazide (HYZAAR) 100-25 MG tablet Take 1 tablet by mouth daily. 90 tablet 3   . OneTouch Delica Lancets 00X MISC Use to check blood sugar 3 times daily. Dx codes: E11.8, E11.65 100 each 11   . oxyCODONE-acetaminophen (PERCOCET) 10-325 MG tablet Take 1 tablet by mouth 4 (four) times daily as needed for pain.     . pravastatin (PRAVACHOL) 80 MG tablet Take 80 mg by mouth daily.     . ramelteon (ROZEREM) 8 MG tablet Take 8 mg by mouth at bedtime.     . sitaGLIPtin (JANUVIA) 100 MG tablet Take 1 tablet (100 mg total) by mouth daily. 330 tablet 3   . traZODone (DESYREL) 50 MG tablet Take 0.5-1  tablets (25-50 mg total) by mouth at bedtime  as needed for sleep. 30 tablet 3   . TRULICITY 3 ZP/6.8GA SOPN Inject 3 mg into the skin once a week. For 4 weeks     . TRULICITY 4.5 YG/4.7UW SOPN Inject 4.5 mg into the skin once a week.     . Vitamin D, Ergocalciferol, (DRISDOL) 1.25 MG (50000 UNIT) CAPS capsule Take 50,000 Units by mouth once a week.       Assessment: 44 y.o female admitted 8/2, presented to ED with chest pain, abdominal pain, cough, nausea, and vomiting.   Pharmacy consulted to start IV heparin for Pulmonary embolus. PE per NM pulm perfusion study.  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  Not reported as taking anticoagulation prior to admission.  Baseline PTT = 26, INR = 1.1 within normal limits. Past medical history as noted above includes obesity, PE/DVT (7/9 2018).   Inpatient was started on VTE prophylaxis sq heparin , last given today 8/3 at 12:53.  Will discontinue sq heparin now.  CBC within normal limits.  No bleeding reported.   SCr 2.13> 1.84  8/3 PM update:  Heparin level just above goal No issues per RN  Goal of Therapy:  Heparin level 0.3-0.7 units/ml Monitor platelets by anticoagulation protocol: Yes   Plan:  Dec heparin to 1250 units/hr Re-check heparin level in 6-8 hours Daily heparin level and CBC Monitor for signs and symptoms of bleeding  Narda Bonds, PharmD, BCPS Clinical Pharmacist Phone: [phone]

## 2019-11-25 NOTE — Progress Notes (Addendum)
ANTICOAGULATION CONSULT NOTE - Initial Consult  Pharmacy Consult for Heparin  Indication: pulmonary embolus  Allergies  Allergen Reactions  . Lisinopril Nausea Only and Swelling  . Morphine And Related Itching and Swelling    Patient Measurements: Height: 5' 7"  (170.2 cm) Weight: (!) 104 kg (229 lb 4.5 oz) IBW/kg (Calculated) : 61.6 Heparin Dosing Weight: 85.1 kg  Vital Signs: Temp: 98 F (36.7 C) (08/03 0719) Temp Source: Oral (08/03 0719) BP: 148/88 (08/03 0719) Pulse Rate: 95 (08/03 0719)  Labs: Recent Labs    11/23/19 1852 11/23/19 2213 11/24/19 0535 11/24/19 1126 11/25/19 0349  HGB 14.3  --   --   --  12.6  HCT 41.3  --   --   --  36.4  PLT 427*  --   --   --  390  APTT  --   --  24  --   --   LABPROT  --   --  13.6  --   --   INR  --   --  1.1  --   --   CREATININE 1.64*  --  2.13*  --  1.84*  CKTOTAL  --   --   --  477*  --   TROPONINIHS 15 17  --   --   --     Estimated Creatinine Clearance: 47.4 mL/min (A) (by C-G formula based on SCr of 1.84 mg/dL (H)).   Medical History: Past Medical History:  Diagnosis Date  . Abscess of tunica vaginalis    10/09- Abundant S. aureus- sensitive to all abx  . Anxiety   . Blood dyscrasia   . CAD (coronary artery disease) 06/15/2006   s/p Subendocardial MI with PDA angioplasty(no stent) on 06/15/06 and relook  cath 06/19/06 showed patency of site. Cath 12/10- no restenosis or significant CAD progression  . CVA (cerebral vascular accident) ([place]) ~ 02/2014   denies residual on 04/22/2014  . CVA (cere[facility] vascular accident) [facility])    history of remote right cerebellar infarct noted on head CT at least since 10/2011  . Depression   . Diabetes mellitus type 2, uncontrolled, with complications ([place])   . Fibromyalgia   . Gastritis   . Gastroparesis    secondary to poorly controlled DM, last emptying study performed 01/2010  was normal but may be falsely positive as pt was on reglan  . GERD (gastroesophageal reflux  disease)   . Hepatitis B, chronic (HCC)    Hep BeAb+,Hep B cAb+ & Hep BsAg+ (9/06)  . History of pyelonephritis    H/o GrpB Pyelonephritis (9/06) and UTI- 07/11- E.Coli, 12/10- GBS  . Hyperlipidemia   . Hypertension   . Iron deficiency anemia   . Irregular menses    Small ovarian follicles seen on VX(7/93)  . MI (myocardial infarction) ([place]) 05/2006   PDA percutaneous transluminal coronary angioplasty  . Migraine    "weekly" (04/22/2014)  . N&V (nausea and vomiting)    Chronic. Unclear etiology with multiple admission and ED visits. CT abdomen with and without contrast (02/2011)  showed no acute process. Gastic Emptying scan (01/2010) was normal. Ultrasound of the abdomen was within normal limits. Hepatitis B viral load was undectable. HIV NR. EGD - gastritis, Hpylori + s/p Rx  . Obesity   . OSA (obstructive sleep apnea)    "suppose to wear mask but I don't" (04/22/2014)  . Peripheral neuropathy   . Pneumonia    "this is probably the 2nd or 3rd time I've had  pneumonia" (04/22/2014)  . Recurrent boils   . Seasonal asthma   . Substance abuse ([place])   . Thrombocytosis ([place])    Hem/Onc suggested 2/2 chronic hepatits and/or iron deficiency anemia    Medications:  Medications Prior to Admission  Medication Sig Dispense Refill Last Dose  . albuterol (VENTOLIN HFA) 108 (90 Base) MCG/ACT inhaler Inhale 2 puffs into the lungs every 6 (six) hours as needed for wheezing or shortness of breath. 6.7 g 1   . amitriptyline (ELAVIL) 100 MG tablet Take 100 mg by mouth daily.      Marland Kitchen amLODipine (NORVASC) 10 MG tablet Take 10 mg by mouth daily.     . Blood Glucose Monitoring Suppl (ONETOUCH VERIO REFLECT) w/Device KIT 1 each by Does not apply route 3 (three) times daily. 1 kit 0   . cyclobenzaprine (FLEXERIL) 5 MG tablet Take 5-10 mg by mouth 3 (three) times daily as needed for muscle spasms.     . fluticasone (FLONASE) 50 MCG/ACT nasal spray Place 2 sprays into both nostrils daily.     Marland Kitchen gabapentin  (NEURONTIN) 600 MG tablet Take 600 mg by mouth 2 (two) times daily.     Marland Kitchen glimepiride (AMARYL) 4 MG tablet Take 4 mg by mouth daily.     Marland Kitchen glucose blood (ONETOUCH VERIO) test strip Use as instructed to check blood sugar 3 times daily. Dx codes: E11.8, E11.65 100 each 11   . HUMALOG KWIKPEN 100 UNIT/ML KwikPen Inject 7 Units into the skin 4 (four) times daily -  before meals and at bedtime. Hold if blood glucose is less than 110     . hydrOXYzine (ATARAX/VISTARIL) 50 MG tablet Take 25-50 mg by mouth at bedtime as needed for anxiety.      . Insulin Glargine (LANTUS SOLOSTAR) 100 UNIT/ML Solostar Pen Inject 20 Units into the skin daily. (Patient taking differently: Inject 27 Units into the skin daily. ) 6 mL 2   . Insulin Pen Needle (PEN NEEDLES 3/16") 31G X 5 MM MISC 1 Device by Does not apply route 4 (four) times daily - after meals and at bedtime. 100 each 5   . INSULIN SYRINGE .5CC/28G (INS SYRINGE/NEEDLE .5CC/28G) 28G X 1/2" 0.5 ML MISC Please provide 1 month supply 100 each 0   . levocetirizine (XYZAL) 5 MG tablet Take 1 tablet (5 mg total) by mouth every evening. 30 tablet 11   . losartan (COZAAR) 100 MG tablet Take 100 mg by mouth daily.     Marland Kitchen losartan-hydrochlorothiazide (HYZAAR) 100-25 MG tablet Take 1 tablet by mouth daily. 90 tablet 3   . OneTouch Delica Lancets 67R MISC Use to check blood sugar 3 times daily. Dx codes: E11.8, E11.65 100 each 11   . oxyCODONE-acetaminophen (PERCOCET) 10-325 MG tablet Take 1 tablet by mouth 4 (four) times daily as needed for pain.     . pravastatin (PRAVACHOL) 80 MG tablet Take 80 mg by mouth daily.     . ramelteon (ROZEREM) 8 MG tablet Take 8 mg by mouth at bedtime.     . sitaGLIPtin (JANUVIA) 100 MG tablet Take 1 tablet (100 mg total) by mouth daily. 330 tablet 3   . traZODone (DESYREL) 50 MG tablet Take 0.5-1 tablets (25-50 mg total) by mouth at bedtime as needed for sleep. 30 tablet 3   . TRULICITY 3 FF/6.3WG SOPN Inject 3 mg into the skin once a week.  For 4 weeks     . TRULICITY 4.5 YK/5.9DJ SOPN Inject 4.5 mg  into the skin once a week.     . Vitamin D, Ergocalciferol, (DRISDOL) 1.25 MG (50000 UNIT) CAPS capsule Take 50,000 Units by mouth once a week.       Assessment: 44 y.o female admitted 8/2, presented to ED with chest pain, abdominal pain, cough, nausea, and vomiting.   Pharmacy consulted to start IV heparin for Pulmonary embolus. PE per NM pulm perfusion study.  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  Not reported as taking anticoagulation prior to admission.  Baseline PTT = 26, INR = 1.1 within normal limits. Past medical history as noted above includes obesity, PE/DVT (7/9 2018).   Inpatient was started on VTE prophylaxis sq heparin , last given today 8/3 at 12:53.  Will discontinue sq heparin now.  CBC within normal limits.  No bleeding reported.   SCr 2.13> 1.84  Goal of Therapy:  Heparin level 0.3-0.7 units/ml Monitor platelets by anticoagulation protocol: Yes   Plan:  Discontinue sq heparin now. Heparin 2000 units IV x1 (reduced due to recent sq heparin) Start IV heparin drip at 1400 units/hr Check 6 hour Heparin level Daily heparin level and CBC Monitor for signs and symptoms of bleeding    Nicole Cella, RPh Clinical Pharmacist [phone] Please check AMION for all [place] phone numbers After 10:00 PM, call [place] [phone] 11/25/2019,2:13 PM

## 2019-11-25 NOTE — Progress Notes (Signed)
PROGRESS NOTE    Shirley [name]  VHQ:[id number] DOB: April 06, 1976 DOA: 11/23/2019 PCP: Fredrich Romans, PA   Brief Narrative: 44 year old with past medical history significant for hypertension, hyperlipidemia, CAD, CVA, diabetes type 2 uncontrolled, peripheral neuropathy and gastroparesis, hepatitis B history of PE DVT, OSA and obesity presents with multiple complaints including chest pain, abdominal pain, cough nausea vomiting for 3 days.  She also reports suprasternal chest pain.  Also epigastric left-sided abdominal pain nausea and vomiting.  She reports having similar symptoms associated in the past with gastroparesis but he states that these symptoms are worse.  She report productive cough and wheezing.  Patient in the ED was afebrile, tachycardic heart rate 125 mild tachypnea respiration rate 22 oxygen saturation on room air, white blood cell 12, BUN 28, creatinine 2.1.  Chest x-ray revealed low lung volume bilateral infiltrates. COVID-19 positive test  Negative.   Admitted with sepsis secondary to pneumonia, chest pain ruling out PE VQ scan intermediate risk.  Patient was a started on IV heparin.  Assessment & Plan:   Principal Problem:   Sepsis due to pneumonia [facility]) Active Problems:   Essential hypertension   Abdominal pain, left lower quadrant   Diabetic gastroparesis associated with type 2 diabetes mellitus (HCC)   Abdominal pain, chronic, left lower quadrant   Gastroparesis   Nausea and vomiting   Hypertensive urgency   Thrombocytosis (HCC)   Chronic pain   1-Sepsis secondary to pneumonia: Patient presented with shortness of breath cough, nausea and vomiting.  Chest x-ray showed bilateral opacity concerning with pneumonia. COVID-19 negative Continue with azithromycin and Rocephin. Follow blood cultures and sputum culture Continue to follow white blood cell  Chest pain: Mildly elevated D-dimer.  VQ scan intermediate risk for PE. Plan to start IV heparin.  Could consider  CT angio if renal function improved Check Dopplers of lower extremity She has a history of PE DVT in the past  Abdominal pain: May be related to gastroparesis She would like her diet to be advanced  AKI on CKD stage IIIa; Creatinine on admission 2.1, previous baseline 1.1--1.2 Continue with IV fluids UA: 6-10 white blood cell, Hyaline  cast present, protein,  CK: 447.  Repeat in a.m. Down to 1.8.  Asthma exacerbation, versus bronchitis: Received Solu-Medrol in the ED continue with prednisone Still with bilateral wheezing on lung exam.  Diabetes type 2: Uncontrolled Hemoglobin A1c Continue with Lantus and sliding scale Add 3 units meal coverage  Hypertensive urgency: Hold losartan due to AKI Continue with amlodipine, increase dose to 10 mg.  Thrombocytosis leukocytosis: Acute on chronic. Treated for infectious process Need outpatient follow-up with hematologist  Polysubstance abuse: UDS positive.  Counseling provided  Chronic pain syndrome: Continue with oxycodone  Hyperlipidemia: Continue with pravastatin Hepatitis B chronic   Estimated body mass index is 35.91 kg/m as calculated from the following:   Height as of this encounter: 5\' 7"  (1.702 m).   Weight as of this encounter: 104 kg.   DVT prophylaxis: Heparin drip Code Status: Full code Family Communication: Care discussed with patient Disposition Plan:  Status is: Inpatient  Remains inpatient appropriate because:Hemodynamically unstable   Dispo: The patient is from: Home              Anticipated d/c is to: Home              Anticipated d/c date is: 3 days              Patient currently is not medically  stable to d/c.  Patient admitted for sepsis secondary to pneumonia, also chest pain with VQ scan intermediate risk for PE started on heparin drip        Consultants:   None  Procedures:   None  Antimicrobials:  Ceftriaxone and azithromycin 8/03  Subjective: She is feeling a Farrugia bit  better, still having chest pain and shortness of breath.  Chest pain not necessarily associated with cough  Objective: Vitals:   11/24/19 1916 11/24/19 2140 11/24/19 2319 11/25/19 0330  BP: 140/76  136/87 (!) 150/94  Pulse: (!) 109  98 96  Resp: 20  18 18   Temp: 100.1 F (37.8 C) 97.8 F (36.6 C) 98.8 F (37.1 C) 97.8 F (36.6 C)  TempSrc: Oral Oral Oral Oral  SpO2: 100%  100% 100%  Weight:      Height:        Intake/Output Summary (Last 24 hours) at 11/25/2019 2202 Last data filed at 11/25/2019 0330 Gross per 24 hour  Intake 1750.81 ml  Output 1000 ml  Net 750.81 ml   Filed Weights   11/23/19 1820  Weight: (!) 104 kg    Examination:  General exam: Appears calm and comfortable  Respiratory system: Bilateral wheezing respiratory effort normal. Cardiovascular system: S1 & S2 heard, RRR. No JVD, murmurs, rubs, gallops or clicks. No pedal edema. Gastrointestinal system: Abdomen is nondistended, soft and nontender. No organomegaly or masses felt. Normal bowel sounds heard. Central nervous system: Alert and oriented. No focal neurological deficits. Extremities: Symmetric 5 x 5 power.   Data Reviewed: I have personally reviewed following labs and imaging studies  CBC: Recent Labs  Lab 11/23/19 1852 11/25/19 0349  WBC 12.1* 14.9*  HGB 14.3 12.6  HCT 41.3 36.4  MCV 81.9 79.0*  PLT 427* 542   Basic Metabolic Panel: Recent Labs  Lab 11/23/19 1852 11/24/19 0535 11/25/19 0349  NA 136 136 133*  K 4.1 4.1 3.6  CL 104 101 101  CO2 17* 19* 22  GLUCOSE 211* 240* 339*  BUN 18 28* 33*  CREATININE 1.64* 2.13* 1.84*  CALCIUM 9.2 9.6 8.7*   GFR: Estimated Creatinine Clearance: 47.4 mL/min (A) (by C-G formula based on SCr of 1.84 mg/dL (H)). Liver Function Tests: Recent Labs  Lab 11/24/19 0535  AST 24  ALT 12  ALKPHOS 63  BILITOT 1.4*  PROT 8.6*  ALBUMIN 3.1*   Recent Labs  Lab 11/23/19 1852  LIPASE 19   No results for input(s): AMMONIA in the last 168  hours. Coagulation Profile: Recent Labs  Lab 11/24/19 0535  INR 1.1   Cardiac Enzymes: Recent Labs  Lab 11/24/19 1126  CKTOTAL 477*   BNP (last 3 results) No results for input(s): PROBNP in the last 8760 hours. HbA1C: No results for input(s): HGBA1C in the last 72 hours. CBG: Recent Labs  Lab 11/24/19 1137 11/24/19 1708 11/24/19 2034  GLUCAP 253* 202* 361*   Lipid Profile: No results for input(s): CHOL, HDL, LDLCALC, TRIG, CHOLHDL, LDLDIRECT in the last 72 hours. Thyroid Function Tests: No results for input(s): TSH, T4TOTAL, FREET4, T3FREE, THYROIDAB in the last 72 hours. Anemia Panel: No results for input(s): VITAMINB12, FOLATE, FERRITIN, TIBC, IRON, RETICCTPCT in the last 72 hours. Sepsis Labs: Recent Labs  Lab 11/24/19 0512 11/24/19 0739  LATICACIDVEN 1.9 1.6    Recent Results (from the past 240 hour(s))  SARS Coronavirus 2 by RT PCR (hospital order, performed in [facility] hospital lab) Nasopharyngeal Nasopharyngeal Swab     Status: None  Collection Time: 11/24/19  5:43 AM   Specimen: Nasopharyngeal Swab  Result Value Ref Range Status   SARS Coronavirus 2 NEGATIVE NEGATIVE Final    Comment: (NOTE) SARS-CoV-2 target nucleic acids are NOT DETECTED.  The SARS-CoV-2 RNA is generally detectable in upper and lower respiratory specimens during the acute phase of infection. The lowest concentration of SARS-CoV-2 viral copies this assay can detect is 250 copies / mL. A negative result does not preclude SARS-CoV-2 infection and should not be used as the sole basis for treatment or other patient management decisions.  A negative result may occur with improper specimen collection / handling, submission of specimen other than nasopharyngeal swab, presence of viral mutation(s) within the areas targeted by this assay, and inadequate number of viral copies (<250 copies / mL). A negative result must be combined with clinical observations, patient history, and  epidemiological information.  Fact Sheet for Patients:   StrictlyIdeas.no  Fact Sheet for Healthcare Providers: BankingDealers.co.za  This test is not yet approved or  cleared by the [place] FDA and has been authorized for detection and/or diagnosis of SARS-CoV-2 by FDA under an Emergency Use Authorization (EUA).  This EUA will remain in effect (meaning this test can be used) for the duration of the COVID-19 declaration under Section 564(b)(1) of the Act, 21 U.S.C. section 360bbb-3(b)(1), unless the authorization is terminated or revoked sooner.  Performed at [facility], [place] [address], [place], [place] [address]          Radiology Studies: DG Chest 2 View  Result Date: 11/23/2019 CLINICAL DATA:  Chest pain.  Vomiting. EXAM: CHEST - 2 VIEW COMPARISON:  Radiograph 02/15/2019 FINDINGS: Lung volumes are low. Patchy bilateral lower lung zone airspace opacities. Stable upper normal heart size. Unchanged mediastinal contours. No pleural effusion or pneumothorax. No acute osseous abnormalities are seen. IMPRESSION: Low lung volumes with patchy bilateral lower lung zone airspace opacities, suspicious for pneumonia, particularly on the right. In the setting of vomiting, aspiration is considered. Electronically Signed   By: Keith Rake M.D.   On: 11/23/2019 19:00        Scheduled Meds: . amLODipine  10 mg Oral Daily  . azithromycin  500 mg Oral Daily  . fluticasone  2 spray Each Nare Daily  . heparin  5,000 Units Subcutaneous Q8H  . insulin aspart  0-15 Units Subcutaneous TID WC  . insulin aspart  0-5 Units Subcutaneous QHS  . insulin glargine  27 Units Subcutaneous Daily  . ipratropium-albuterol  3 mL Nebulization QID  . pravastatin  80 mg Oral Daily  . predniSONE  40 mg Oral Q breakfast   Continuous Infusions: . sodium chloride 75 mL/hr at 11/24/19 1822  . cefTRIAXone (ROCEPHIN)  IV       LOS: 1 day    Time  spent: 35 minutes.     Elmarie Shiley, MD [facility]   If 7PM-7AM, please contact night-coverage [email or web address]  11/25/2019, 7:07 AM

## 2019-11-25 NOTE — Plan of Care (Signed)
  Problem: Education: Goal: Knowledge of General Education information will improve Description Including pain rating scale, medication(s)/side effects and non-pharmacologic comfort measures Outcome: Progressing   

## 2019-11-25 NOTE — Progress Notes (Signed)
Initial Nutrition Assessment  DOCUMENTATION CODES:   Obesity unspecified  INTERVENTION:  Ensure Max po TID, each supplement provides 150 kcal and 30 grams of protein   NUTRITION DIAGNOSIS:   Inadequate oral intake related to poor appetite as evidenced by per patient/family report.    GOAL:   Patient will meet greater than or equal to 90% of their needs    MONITOR:   PO intake, Supplement acceptance, Labs, Weight trends, I & O's  REASON FOR ASSESSMENT:   Malnutrition Screening Tool    ASSESSMENT:   Pt admitted with sepsis 2/2 to PNA. PMH includes HTN, HLD, CAD, CVA, T2DM,gastroparesis, hepatitis B, PE/DVT, OSA.  Pt declined RD visit at this time.   Per H&P, pt does use marijuana and cocaine. Pt reported abdominal pain, nausea, and vomiting for 3 days PTA. Pt stated that she has had similar symptoms in the past associated with her hx of gastroparesis.   Reviewed wt hx, no significant wt changes noted.   No PO intake since admission.  Labs: Na 133 (L) CBGs [phone] Medications: Novolog, Lantus, Deltasone  NUTRITION - FOCUSED PHYSICAL EXAM:  Unable to perform at this time, will attempt at follow-up.  Diet Order:   Diet Order            Diet Carb Modified Fluid consistency: Thin; Room service appropriate? Yes  Diet effective now                 EDUCATION NEEDS:   No education needs have been identified at this time  Skin:  Skin Assessment: Skin Integrity Issues: Skin Integrity Issues:: Other (Comment) Other: MASD R/L breast  Last BM:  PTA  Height:   Ht Readings from Last 1 Encounters:  11/23/19 5\' 7"  (1.702 m)    Weight:   Wt Readings from Last 1 Encounters:  11/23/19 (!) 104 kg    Ideal Body Weight:  61.36 kg  BMI:  Body mass index is 35.91 kg/m.  Estimated Nutritional Needs:   Kcal:  1950-2150  Protein:  115-125 grams  Fluid:  >/=1.95L/d    Larkin Ina, MS, RD, LDN RD pager number and weekend/on-call pager number  located in [place].

## 2019-11-25 NOTE — Progress Notes (Signed)
CBG 430. MD notified. Received new orders for insulin. Will continue to monitor patient blood sugar.

## 2019-11-26 ENCOUNTER — Inpatient Hospital Stay (HOSPITAL_COMMUNITY): Payer: Medicare Other

## 2019-11-26 DIAGNOSIS — R7989 Other specified abnormal findings of blood chemistry: Secondary | ICD-10-CM

## 2019-11-26 DIAGNOSIS — R609 Edema, unspecified: Secondary | ICD-10-CM

## 2019-11-26 LAB — RESPIRATORY PANEL BY PCR

## 2019-11-26 LAB — BASIC METABOLIC PANEL
Anion gap: 10 (ref 5–15)
BUN: 29 mg/dL — ABNORMAL HIGH (ref 6–20)
CO2: 22 mmol/L (ref 22–32)
Calcium: 8.7 mg/dL — ABNORMAL LOW (ref 8.9–10.3)
Chloride: 105 mmol/L (ref 98–111)
Creatinine, Ser: 1.61 mg/dL — ABNORMAL HIGH (ref 0.44–1.00)
GFR calc Af Amer: 44 mL/min — ABNORMAL LOW (ref >60)
GFR calc non Af Amer: 38 mL/min — ABNORMAL LOW (ref >60)
Glucose, Bld: 199 mg/dL — ABNORMAL HIGH (ref 70–99)
Potassium: 3.6 mmol/L (ref 3.5–5.1)
Sodium: 137 mmol/L (ref 135–145)

## 2019-11-26 LAB — CBC
HCT: 32.9 % — ABNORMAL LOW (ref 36.0–46.0)
Hemoglobin: 11.6 g/dL — ABNORMAL LOW (ref 12.0–15.0)
MCH: 28.2 pg (ref 26.0–34.0)
MCHC: 35.3 g/dL (ref 30.0–36.0)
MCV: 79.9 fL — ABNORMAL LOW (ref 80.0–100.0)
Platelets: 412 10*3/uL — ABNORMAL HIGH (ref 150–400)
RBC: 4.12 MIL/uL (ref 3.87–5.11)
RDW: 13.7 % (ref 11.5–15.5)
WBC: 15.3 10*3/uL — ABNORMAL HIGH (ref 4.0–10.5)
nRBC: 0 % (ref 0.0–0.2)

## 2019-11-26 LAB — HEMOGLOBIN A1C
Hgb A1c MFr Bld: 12 % — ABNORMAL HIGH (ref 4.8–5.6)
Mean Plasma Glucose: 297.7 mg/dL

## 2019-11-26 LAB — C-REACTIVE PROTEIN: CRP: 1.4 mg/dL — ABNORMAL HIGH (ref <1.0)

## 2019-11-26 LAB — URINE CULTURE

## 2019-11-26 LAB — GLUCOSE, CAPILLARY
Glucose-Capillary: 176 mg/dL — ABNORMAL HIGH (ref 70–99)
Glucose-Capillary: 242 mg/dL — ABNORMAL HIGH (ref 70–99)
Glucose-Capillary: 357 mg/dL — ABNORMAL HIGH (ref 70–99)
Glucose-Capillary: 411 mg/dL — ABNORMAL HIGH (ref 70–99)

## 2019-11-26 LAB — LEGIONELLA PNEUMOPHILA SEROGP 1 UR AG: L. pneumophila Serogp 1 Ur Ag: NEGATIVE

## 2019-11-26 LAB — CK: Total CK: 195 U/L (ref 38–234)

## 2019-11-26 LAB — PROCALCITONIN: Procalcitonin: <0.1 ng/mL

## 2019-11-26 LAB — HEPARIN LEVEL (UNFRACTIONATED)
Heparin Unfractionated: 1.01 IU/mL — ABNORMAL HIGH (ref 0.30–0.70)
Heparin Unfractionated: 0.36 IU/mL (ref 0.30–0.70)

## 2019-11-26 MED ORDER — INSULIN ASPART 100 UNIT/ML ~~LOC~~ SOLN
10.0000 [IU] | Freq: Once | SUBCUTANEOUS | Status: AC
  Administered 2019-11-26: 10 [IU] via SUBCUTANEOUS

## 2019-11-26 MED ORDER — PANTOPRAZOLE SODIUM 40 MG PO TBEC
40.0000 mg | DELAYED_RELEASE_TABLET | Freq: Every day | ORAL | Status: DC
  Administered 2019-11-26 – 2019-11-30 (×5): 40 mg via ORAL
  Filled 2019-11-26 (×5): qty 1

## 2019-11-26 MED ORDER — INSULIN GLARGINE 100 UNIT/ML ~~LOC~~ SOLN
35.0000 [IU] | Freq: Every day | SUBCUTANEOUS | Status: DC
  Administered 2019-11-26: 35 [IU] via SUBCUTANEOUS
  Filled 2019-11-26 (×2): qty 0.35

## 2019-11-26 MED ORDER — OXYCODONE-ACETAMINOPHEN 5-325 MG PO TABS
2.0000 | ORAL_TABLET | Freq: Four times a day (QID) | ORAL | Status: DC | PRN
  Administered 2019-11-26 – 2019-11-30 (×7): 2 via ORAL
  Filled 2019-11-26 (×8): qty 2

## 2019-11-26 MED ORDER — BENZONATATE 100 MG PO CAPS
100.0000 mg | ORAL_CAPSULE | Freq: Two times a day (BID) | ORAL | Status: DC
  Administered 2019-11-26 – 2019-11-28 (×4): 100 mg via ORAL
  Filled 2019-11-26 (×4): qty 1

## 2019-11-26 MED ORDER — POLYETHYLENE GLYCOL 3350 17 G PO PACK
17.0000 g | PACK | Freq: Every day | ORAL | Status: DC
  Administered 2019-11-26: 17 g via ORAL
  Filled 2019-11-26 (×5): qty 1

## 2019-11-26 MED ORDER — HEPARIN SODIUM (PORCINE) 5000 UNIT/ML IJ SOLN
5000.0000 [IU] | Freq: Three times a day (TID) | INTRAMUSCULAR | Status: DC
  Administered 2019-11-26 – 2019-11-30 (×10): 5000 [IU] via SUBCUTANEOUS
  Filled 2019-11-26 (×11): qty 1

## 2019-11-26 MED ORDER — SENNOSIDES-DOCUSATE SODIUM 8.6-50 MG PO TABS
1.0000 | ORAL_TABLET | Freq: Two times a day (BID) | ORAL | Status: DC
  Administered 2019-11-26 – 2019-11-30 (×7): 1 via ORAL
  Filled 2019-11-26 (×9): qty 1

## 2019-11-26 MED ORDER — DM-GUAIFENESIN ER 30-600 MG PO TB12
1.0000 | ORAL_TABLET | Freq: Two times a day (BID) | ORAL | Status: DC
  Administered 2019-11-26 – 2019-11-30 (×8): 1 via ORAL
  Filled 2019-11-26 (×8): qty 1

## 2019-11-26 MED ORDER — METHYLPREDNISOLONE SODIUM SUCC 125 MG IJ SOLR
60.0000 mg | Freq: Two times a day (BID) | INTRAMUSCULAR | Status: DC
  Administered 2019-11-26 (×2): 60 mg via INTRAVENOUS
  Filled 2019-11-26 (×2): qty 2

## 2019-11-26 MED ORDER — INSULIN ASPART 100 UNIT/ML ~~LOC~~ SOLN
5.0000 [IU] | Freq: Three times a day (TID) | SUBCUTANEOUS | Status: DC
  Administered 2019-11-26 – 2019-11-27 (×3): 5 [IU] via SUBCUTANEOUS

## 2019-11-26 MED ORDER — HYDROMORPHONE HCL 1 MG/ML IJ SOLN
1.0000 mg | INTRAMUSCULAR | Status: DC | PRN
  Administered 2019-11-26 – 2019-11-30 (×9): 1 mg via INTRAVENOUS
  Filled 2019-11-26 (×9): qty 1

## 2019-11-26 MED ORDER — HEPARIN (PORCINE) 25000 UT/250ML-% IV SOLN
1000.0000 [IU]/h | INTRAVENOUS | Status: DC
  Administered 2019-11-26: 1000 [IU]/h via INTRAVENOUS

## 2019-11-26 MED ORDER — SODIUM CHLORIDE 0.9 % IV SOLN: INTRAVENOUS | Status: AC

## 2019-11-26 NOTE — Progress Notes (Addendum)
[place] for IV Heparin  Indication: pulmonary embolus  Allergies  Allergen Reactions  . Lisinopril Nausea Only and Swelling  . Morphine And Related Itching and Swelling    Patient Measurements: Height: 5\' 7"  (170.2 cm) Weight: (!) 104 kg (229 lb 4.5 oz) IBW/kg (Calculated) : 61.6 Heparin Dosing Weight: 85.1 kg  Vital Signs: Temp: 98.6 F (37 C) (08/04 0956) Temp Source: Oral (08/04 0956) BP: 153/89 (08/04 0956) Pulse Rate: 95 (08/04 0956)  Labs: Recent Labs    11/23/19 1852 11/23/19 1852 11/23/19 2213 11/24/19 0535 11/24/19 1126 11/25/19 0349 11/25/19 2203 11/26/19 0641 11/26/19 1549  HGB 14.3   < >  --   --   --  12.6  --  11.6*  --   HCT 41.3  --   --   --   --  36.4  --  32.9*  --   PLT 427*  --   --   --   --  390  --  412*  --   APTT  --   --   --  24  --   --   --   --   --   LABPROT  --   --   --  13.6  --   --   --   --   --   INR  --   --   --  1.1  --   --   --   --   --   HEPARINUNFRC  --   --   --   --   --   --  0.84* 1.01* 0.36  CREATININE 1.64*   < >  --  2.13*  --  1.84*  --  1.61*  --   CKTOTAL  --   --   --   --  477*  --   --  195  --   TROPONINIHS 15  --  17  --   --   --   --   --   --    < > = values in this interval not displayed.    Estimated Creatinine Clearance: 54.2 mL/min (A) (by C-G formula based on SCr of 1.61 mg/dL (H)).   Medical History: Past Medical History:  Diagnosis Date  . Abscess of tunica vaginalis    10/09- Abundant S. aureus- sensitive to all abx  . Anxiety   . Blood dyscrasia   . CAD (coronary artery disease) 06/15/2006   s/p Subendocardial MI with PDA angioplasty(no stent) on 06/15/06 and relook  cath 06/19/06 showed patency of site. Cath 12/10- no restenosis or significant CAD progression  . CVA (cerebral vascular accident) ([place]) ~ 02/2014   denies residual on 04/22/2014  . CVA (cerebral vascular accident) [facility])    history of remote right cerebellar infarct noted on head CT  at least since 10/2011  . Depression   . Diabetes mellitus type 2, uncontrolled, with complications ([place])   . Fibromyalgia   . Gastritis   . Gastroparesis    secondary to poorly controlled DM, last emptying study performed 01/2010  was normal but may be falsely positive as pt was on reglan  . GERD (gastroesophageal reflux disease)   . Hepatitis B, chronic (HCC)    Hep BeAb+,Hep B cAb+ & Hep BsAg+ (9/06)  . History of pyelonephritis    H/o GrpB Pyelonephritis (9/06) and UTI- 07/11- E.Coli, 12/10- GBS  . Hyperlipidemia   . Hypertension   .  Iron deficiency anemia   . Irregular menses    Small ovarian follicles seen on [facility](9/19)  . MI (myocardial infarction) ([place]) 05/2006   PDA percutaneous transluminal coronary angioplasty  . Migraine    "weekly" (04/22/2014)  . N&V (nausea and vomiting)    Chronic. Unclear etiology with multiple admission and ED visits. CT abdomen with and without contrast (02/2011)  showed no acute process. Gastic Emptying scan (01/2010) was normal. Ultrasound of the abdomen was within normal limits. Hepatitis B viral load was undectable. HIV NR. EGD - gastritis, Hpylori + s/p Rx  . Obesity   . OSA (obstructive sleep apnea)    "suppose to wear mask but I don't" (04/22/2014)  . Peripheral neuropathy   . Pneumonia    "this is probably the 2nd or 3rd time I've had pneumonia" (04/22/2014)  . Recurrent boils   . Seasonal asthma   . Substance abuse ([place])   . Thrombocytosis ([place])    Hem/Onc suggested 2/2 chronic hepatits and/or iron deficiency anemia    Assessment: 44 y.o female with hx PE/DVT in 10/2016 (not on anticoagulation PTA) was admitted 8/2 after presenting to ED with chest pain, abdominal pain, cough, nausea, and vomiting.  Pharmacy was consulted to start IV heparin for pulmonary embolus (VQ scan on admission showed intermediate probability of PE);  d-dimer 0.74 on 11/24/19.  Dopplers today: no evidence of DVT in L or R lower extremity  Heparin level (~5.5 hrs  after heparin infusion was held X 1 hr and infusion rate was reduced to 1000 units/hr for heparin level of 1.01 units/ml earlier today) was 0.36 units/ml, which is within the goal range for this pt. H/H 11.6/32.9 (trending down), platelets 412; Scr improving since admission. Per RN, no issues with IV (other than beeping a couple of times, which has been resolved) or bleeding observed.   Goal of Therapy:  Heparin level 0.3-0.7 units/ml Monitor platelets by anticoagulation protocol: Yes   Plan:  Continue heparin infusion at 1000 units/hr Check confirmatory heparin level in 6 hours Monitor daily heparin level and CBC Monitor for signs and symptoms of bleeding F/U anticoagulation plans, given negative Dopplers  Gillermina Hu, PharmD, BCPS, [facility] Clinical Pharmacist 11/26/19, 4:30 PM

## 2019-11-26 NOTE — Progress Notes (Signed)
PT Cancellation Note  Patient Details Name: Shirley [name] MRN: [id number] DOB: 03-31-1976   Cancelled Treatment:    Reason Eval/Treat Not Completed: Patient declined, no reason specified. Pt stating "I'm not getting up right now" secondary to abdominal pain. Pt also reporting that the pain meds she has received earlier are not working for her. PT will continue to f/u with pt acutely as available.    Clearnce Sorrel Talan Gildner 11/26/2019, 8:30 AM

## 2019-11-26 NOTE — Progress Notes (Signed)
Lower extremity venous bilateral study completed.   Preliminary results given to Gwenlyn Perking, RN.  See Cv Proc for preliminary results.   Darlin Coco

## 2019-11-26 NOTE — Plan of Care (Signed)
  Problem: Education: Goal: Knowledge of General Education information will improve Description: Including pain rating scale, medication(s)/side effects and non-pharmacologic comfort measures Outcome: Progressing   Problem: Pain Managment: Goal: General experience of comfort will improve Outcome: Progressing   

## 2019-11-26 NOTE — Progress Notes (Addendum)
ANTICOAGULATION CONSULT NOTE -  Pharmacy Consult for Heparin  Indication: pulmonary embolus  Allergies  Allergen Reactions  . Lisinopril Nausea Only and Swelling  . Morphine And Related Itching and Swelling    Patient Measurements: Height: 5' 7"  (170.2 cm) Weight: (!) 104 kg (229 lb 4.5 oz) IBW/kg (Calculated) : 61.6 Heparin Dosing Weight: 85.1 kg  Vital Signs: Temp: 98.8 F (37.1 C) (08/04 0528) Temp Source: Oral (08/04 0528) BP: 133/95 (08/04 0528) Pulse Rate: 91 (08/04 0528)  Labs: Recent Labs    11/23/19 1852 11/23/19 1852 11/23/19 2213 11/24/19 0535 11/24/19 1126 11/25/19 0349 11/25/19 2203 11/26/19 0641  HGB 14.3   < >  --   --   --  12.6  --  11.6*  HCT 41.3  --   --   --   --  36.4  --  32.9*  PLT 427*  --   --   --   --  390  --  412*  APTT  --   --   --  24  --   --   --   --   LABPROT  --   --   --  13.6  --   --   --   --   INR  --   --   --  1.1  --   --   --   --   HEPARINUNFRC  --   --   --   --   --   --  0.84* 1.01*  CREATININE 1.64*   < >  --  2.13*  --  1.84*  --  1.61*  CKTOTAL  --   --   --   --  477*  --   --  195  TROPONINIHS 15  --  17  --   --   --   --   --    < > = values in this interval not displayed.    Estimated Creatinine Clearance: 54.2 mL/min (A) (by C-G formula based on SCr of 1.61 mg/dL (H)).   Medical History: Past Medical History:  Diagnosis Date  . Abscess of tunica vaginalis    10/09- Abundant S. aureus- sensitive to all abx  . Anxiety   . Blood dyscrasia   . CAD (coronary artery disease) 06/15/2006   s/p Subendocardial MI with PDA angioplasty(no stent) on 06/15/06 and relook  cath 06/19/06 showed patency of site. Cath 12/10- no restenosis or significant CAD progression  . CVA (cerebral vascular accident) ([place]) ~ 02/2014   denies residual on 04/22/2014  . CVA (cerebral vascular accident) [facility])    history of remote right cerebellar infarct noted on head CT at least since 10/2011  . Depression   . Diabetes mellitus  type 2, uncontrolled, with complications ([place])   . Fibromyalgia   . Gastritis   . Gastroparesis    secondary to poorly controlled DM, last emptying study performed 01/2010  was normal but may be falsely positive as pt was on reglan  . GERD (gastroesophageal reflux disease)   . Hepatitis B, chronic (HCC)    Hep BeAb+,Hep B cAb+ & Hep BsAg+ (9/06)  . History of pyelonephritis    H/o GrpB Pyelonephritis (9/06) and UTI- 07/11- E.Coli, 12/10- GBS  . Hyperlipidemia   . Hypertension   . Iron deficiency anemia   . Irregular menses    Small ovarian follicles seen on MA(2/63)  . MI (myocardial infarction) ([place]) 05/2006   PDA percutaneous transluminal coronary  angioplasty  . Migraine    "weekly" (04/22/2014)  . N&V (nausea and vomiting)    Chronic. Unclear etiology with multiple admission and ED visits. CT abdomen with and without contrast (02/2011)  showed no acute process. Gastic Emptying scan (01/2010) was normal. Ultrasound of the abdomen was within normal limits. Hepatitis B viral load was undectable. HIV NR. EGD - gastritis, Hpylori + s/p Rx  . Obesity   . OSA (obstructive sleep apnea)    "suppose to wear mask but I don't" (04/22/2014)  . Peripheral neuropathy   . Pneumonia    "this is probably the 2nd or 3rd time I've had pneumonia" (04/22/2014)  . Recurrent boils   . Seasonal asthma   . Substance abuse ([place])   . Thrombocytosis ([place])    Hem/Onc suggested 2/2 chronic hepatits and/or iron deficiency anemia    Medications:  Medications Prior to Admission  Medication Sig Dispense Refill Last Dose  . albuterol (VENTOLIN HFA) 108 (90 Base) MCG/ACT inhaler Inhale 2 puffs into the lungs every 6 (six) hours as needed for wheezing or shortness of breath. 6.7 g 1   . amitriptyline (ELAVIL) 100 MG tablet Take 100 mg by mouth daily.      Marland Kitchen amLODipine (NORVASC) 10 MG tablet Take 10 mg by mouth daily.     . Blood Glucose Monitoring Suppl (ONETOUCH VERIO REFLECT) w/Device KIT 1 each by Does not  apply route 3 (three) times daily. 1 kit 0   . cyclobenzaprine (FLEXERIL) 5 MG tablet Take 5-10 mg by mouth 3 (three) times daily as needed for muscle spasms.     . fluticasone (FLONASE) 50 MCG/ACT nasal spray Place 2 sprays into both nostrils daily.     Marland Kitchen gabapentin (NEURONTIN) 600 MG tablet Take 600 mg by mouth 2 (two) times daily.     Marland Kitchen glimepiride (AMARYL) 4 MG tablet Take 4 mg by mouth daily.     Marland Kitchen glucose blood (ONETOUCH VERIO) test strip Use as instructed to check blood sugar 3 times daily. Dx codes: E11.8, E11.65 100 each 11   . HUMALOG KWIKPEN 100 UNIT/ML KwikPen Inject 7 Units into the skin 4 (four) times daily -  before meals and at bedtime. Hold if blood glucose is less than 110     . hydrOXYzine (ATARAX/VISTARIL) 50 MG tablet Take 25-50 mg by mouth at bedtime as needed for anxiety.      . Insulin Glargine (LANTUS SOLOSTAR) 100 UNIT/ML Solostar Pen Inject 20 Units into the skin daily. (Patient taking differently: Inject 27 Units into the skin daily. ) 6 mL 2   . Insulin Pen Needle (PEN NEEDLES 3/16") 31G X 5 MM MISC 1 Device by Does not apply route 4 (four) times daily - after meals and at bedtime. 100 each 5   . INSULIN SYRINGE .5CC/28G (INS SYRINGE/NEEDLE .5CC/28G) 28G X 1/2" 0.5 ML MISC Please provide 1 month supply 100 each 0   . levocetirizine (XYZAL) 5 MG tablet Take 1 tablet (5 mg total) by mouth every evening. 30 tablet 11   . losartan (COZAAR) 100 MG tablet Take 100 mg by mouth daily.     Marland Kitchen losartan-hydrochlorothiazide (HYZAAR) 100-25 MG tablet Take 1 tablet by mouth daily. 90 tablet 3   . OneTouch Delica Lancets 79T MISC Use to check blood sugar 3 times daily. Dx codes: E11.8, E11.65 100 each 11   . oxyCODONE-acetaminophen (PERCOCET) 10-325 MG tablet Take 1 tablet by mouth 4 (four) times daily as needed for pain.     Marland Kitchen  pravastatin (PRAVACHOL) 80 MG tablet Take 80 mg by mouth daily.     . ramelteon (ROZEREM) 8 MG tablet Take 8 mg by mouth at bedtime.     . sitaGLIPtin (JANUVIA)  100 MG tablet Take 1 tablet (100 mg total) by mouth daily. 330 tablet 3   . traZODone (DESYREL) 50 MG tablet Take 0.5-1 tablets (25-50 mg total) by mouth at bedtime as needed for sleep. 30 tablet 3   . TRULICITY 3 YI/0.1KP SOPN Inject 3 mg into the skin once a week. For 4 weeks     . TRULICITY 4.5 VV/7.4MO SOPN Inject 4.5 mg into the skin once a week.     . Vitamin D, Ergocalciferol, (DRISDOL) 1.25 MG (50000 UNIT) CAPS capsule Take 50,000 Units by mouth once a week.       Assessment: 44 y.o female admitted 8/2, presented to ED with chest pain, abdominal pain, cough, nausea, and vomiting.   Pharmacy consulted to start IV heparin for Pulmonary embolus. PE per NM pulm perfusion study.  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  Not reported as taking anticoagulation prior to admission.  Baseline PTT = 26, INR = 1.1 within normal limits. Past medical history as noted above includes obesity, PE/DVT (7/9 2018).   11/26/19:  Heparin level 1.01 after heparin rate decreased to 1250 units/hr. HL drawn correctly from arm opposite of heparin infusion arm,  no issues with heparin infusion and no bleeding noted per RN's report.  Hgb 12.6>11.6, pltc [id number] .  SCr 2.13> 1.84>1.61  Goal of Therapy:  Heparin level 0.3-0.7 units/ml Monitor platelets by anticoagulation protocol: Yes   Plan:  Hold heparin drip x 1 hour Then resume heparin at 1000 units/hr Check heparin level in 6 hours Daily heparin level and CBC Monitor for signs and symptoms of bleeding   Nicole Cella, RPh Clinical Pharmacist [phone] Please check AMION for all [place] phone numbers After 10:00 PM, call [place] [phone] 11/26/2019 8:25 AM

## 2019-11-26 NOTE — Progress Notes (Addendum)
PROGRESS NOTE    Shirley [name]  FYB:[id number] DOB: 12-20-1975 DOA: 11/23/2019 PCP: Fredrich Romans, PA   Brief Narrative: 44 year old with past medical history significant for hypertension, hyperlipidemia, CAD, CVA, diabetes type 2 uncontrolled, peripheral neuropathy and gastroparesis, hepatitis B history of PE DVT, OSA and obesity presents with multiple complaints including chest pain, abdominal pain, productive cough, wheezing nausea vomiting for 3 days.  She also reports suprasternal chest pain.  Also epigastric left-sided abdominal pain nausea and vomiting. Patient in the ED was afebrile, tachycardic heart rate 125 mild tachypnea respiration rate 22 oxygen saturation on room air, white blood cell 12, BUN 28, creatinine 2.1.  Chest x-ray revealed low lung volume bilateral infiltrates. COVID-19 positive test  Negative.  Admitted with sepsis secondary to pneumonia,  VQ scan intermediate risk.  Patient was a started on IV heparin.  Assessment & Plan:   Sepsis secondary to pneumonia Asthma exacerbation : Patient presented with productive cough, shortness of breath, wheezing myalgias, anorexia  -Chest x-ray noted bilateral opacities concerning for pneumonia -Covid PCR is negative -Continue Rocephin and azithromycin, change prednisone to IV Solu-Medrol -Add Mucinex and Tessalon Perles -Duo nebs -Blood cultures negative  Abnormal D-dimer -Had a VQ scan on on admission which showed intermediate probability of PE -Clinically her symptoms are concerned consistent with pneumonia and asthma exacerbation -Check lower extremity Dopplers, if this is negative we will stop IV heparin -Patient has prior history of DVT  Abdominal pain: May be related to gastroparesis, coughing -Advance diet as tolerated  AKI on CKD stage IIIa; Creatinine on admission 2.1, previous baseline 1.1--1.2 -Continue IV fluids today  Diabetes type 2: Uncontrolled Hemoglobin A1c is 12.0 indicating long-term poor  control -CBGs high in the setting of IV steroids, increase Lantus, increase meal coverage -Continue sliding scale  Uncontrolled hypertension Held losartan due to AKI Amlodipine dose increased, monitor  Thrombocytosis leukocytosis: Acute on chronic. -Would benefit from outpatient heme referral  Polysubstance abuse: UDS positive.  Counseling provided  Chronic pain syndrome: On Percocet at baseline, this was resumed  Hyperlipidemia: Continue with pravastatin  Hepatitis B chronic   Estimated body mass index is 35.91 kg/m as calculated from the following:   Height as of this encounter: 5\' 7"  (1.702 m).   Weight as of this encounter: 104 kg.   DVT prophylaxis: Heparin drip Code Status: Full code Family Communication: Care discussed with patient Disposition Plan:  Status is: Inpatient  Remains inpatient appropriate because: Acute illness   Dispo: The patient is from: Home              Anticipated d/c is to: Home              Anticipated d/c date is: 2-3days              Patient currently is not medically stable to d/c.        Consultants:   None  Procedures:   None  Antimicrobials:  Ceftriaxone and azithromycin 8/03  Subjective: -Complains of productive cough, congestion, chest tightness and mild wheezing, also reports myalgias and abdominal pain  Objective: Vitals:   11/26/19 0526 11/26/19 0528 11/26/19 0903 11/26/19 0956  BP: (!) 133/95 (!) 133/95  (!) 153/89  Pulse: 91 91  95  Resp: 18 18  18   Temp: 98.8 F (37.1 C) 98.8 F (37.1 C)  98.6 F (37 C)  TempSrc: Oral Oral  Oral  SpO2: 99% 99% 99% 100%  Weight:      Height:  Intake/Output Summary (Last 24 hours) at 11/26/2019 1231 Last data filed at 11/26/2019 1100 Gross per 24 hour  Intake 2264.09 ml  Output 1200 ml  Net 1064.09 ml   Filed Weights   11/23/19 1820  Weight: (!) 104 kg    Examination:  General exam: Pleasant middle-aged female sitting up in bed, uncomfortable appearing,  AAOx3 HEENT: No JVD CVS: S1-S2, regular rate rhythm Lungs: Poor air movement bilaterally, basilar spot scattered rhonchi and few wheezes Abdomen: Soft, nontender, bowel sounds present Extremities: No edema Psych: Appropriate mood and affect   Data Reviewed: I have personally reviewed following labs and imaging studies  CBC: Recent Labs  Lab 11/23/19 1852 11/25/19 0349 11/26/19 0641  WBC 12.1* 14.9* 15.3*  HGB 14.3 12.6 11.6*  HCT 41.3 36.4 32.9*  MCV 81.9 79.0* 79.9*  PLT 427* 390 716*   Basic Metabolic Panel: Recent Labs  Lab 11/23/19 1852 11/24/19 0535 11/25/19 0349 11/26/19 0641  NA 136 136 133* 137  K 4.1 4.1 3.6 3.6  CL 104 101 101 105  CO2 17* 19* 22 22  GLUCOSE 211* 240* 339* 199*  BUN 18 28* 33* 29*  CREATININE 1.64* 2.13* 1.84* 1.61*  CALCIUM 9.2 9.6 8.7* 8.7*   GFR: Estimated Creatinine Clearance: 54.2 mL/min (A) (by C-G formula based on SCr of 1.61 mg/dL (H)). Liver Function Tests: Recent Labs  Lab 11/24/19 0535  AST 24  ALT 12  ALKPHOS 63  BILITOT 1.4*  PROT 8.6*  ALBUMIN 3.1*   Recent Labs  Lab 11/23/19 1852  LIPASE 19   No results for input(s): AMMONIA in the last 168 hours. Coagulation Profile: Recent Labs  Lab 11/24/19 0535  INR 1.1   Cardiac Enzymes: Recent Labs  Lab 11/24/19 1126 11/26/19 0641  CKTOTAL 477* 195   BNP (last 3 results) No results for input(s): PROBNP in the last 8760 hours. HbA1C: Recent Labs    11/25/19 2203  HGBA1C 12.0*   CBG: Recent Labs  Lab 11/25/19 1212 11/25/19 1622 11/25/19 2053 11/26/19 0710 11/26/19 1106  GLUCAP 256* 430* 389* 176* 242*   Lipid Profile: No results for input(s): CHOL, HDL, LDLCALC, TRIG, CHOLHDL, LDLDIRECT in the last 72 hours. Thyroid Function Tests: No results for input(s): TSH, T4TOTAL, FREET4, T3FREE, THYROIDAB in the last 72 hours. Anemia Panel: No results for input(s): VITAMINB12, FOLATE, FERRITIN, TIBC, IRON, RETICCTPCT in the last 72 hours. Sepsis  Labs: Recent Labs  Lab 11/24/19 0512 11/24/19 0739  LATICACIDVEN 1.9 1.6    Recent Results (from the past 240 hour(s))  Blood Culture (routine x 2)     Status: None (Preliminary result)   Collection Time: 11/24/19  5:35 AM   Specimen: BLOOD  Result Value Ref Range Status   Specimen Description BLOOD SITE NOT SPECIFIED  Final   Special Requests   Final    BOTTLES DRAWN AEROBIC AND ANAEROBIC Blood Culture adequate volume   Culture   Final    NO GROWTH 2 DAYS Performed at [facility], [address] [place], [place] [address]    Report Status PENDING  Incomplete  SARS Coronavirus 2 by RT PCR (hospital order, performed in [facility] hospital lab) Nasopharyngeal Nasopharyngeal Swab     Status: None   Collection Time: 11/24/19  5:43 AM   Specimen: Nasopharyngeal Swab  Result Value Ref Range Status   SARS Coronavirus 2 NEGATIVE NEGATIVE Final    Comment: (NOTE) SARS-CoV-2 target nucleic acids are NOT DETECTED.  The SARS-CoV-2 RNA is generally detectable  in upper and lower respiratory specimens during the acute phase of infection. The lowest concentration of SARS-CoV-2 viral copies this assay can detect is 250 copies / mL. A negative result does not preclude SARS-CoV-2 infection and should not be used as the sole basis for treatment or other patient management decisions.  A negative result may occur with improper specimen collection / handling, submission of specimen other than nasopharyngeal swab, presence of viral mutation(s) within the areas targeted by this assay, and inadequate number of viral copies (<250 copies / mL). A negative result must be combined with clinical observations, patient history, and epidemiological information.  Fact Sheet for Patients:   StrictlyIdeas.no  Fact Sheet for Healthcare Providers: BankingDealers.co.za  This test is not yet approved or  cleared by the [place] FDA and has been  authorized for detection and/or diagnosis of SARS-CoV-2 by FDA under an Emergency Use Authorization (EUA).  This EUA will remain in effect (meaning this test can be used) for the duration of the COVID-19 declaration under Section 564(b)(1) of the Act, 21 U.S.C. section 360bbb-3(b)(1), unless the authorization is terminated or revoked sooner.  Performed at [facility], [place] [address] [place], [place] [address]   Blood Culture (routine x 2)     Status: None (Preliminary result)   Collection Time: 11/24/19  7:39 AM   Specimen: BLOOD  Result Value Ref Range Status   Specimen Description BLOOD RIGHT ANTECUBITAL  Final   Special Requests   Final    BOTTLES DRAWN AEROBIC ONLY Blood Culture results may not be optimal due to an inadequate volume of blood received in culture bottles   Culture   Final    NO GROWTH 2 DAYS Performed at [facility], [place] [address] [place], [place] [address]    Report Status PENDING  Incomplete  Urine culture     Status: Abnormal   Collection Time: 11/25/19  3:44 AM   Specimen: In/Out Cath Urine  Result Value Ref Range Status   Specimen Description IN/OUT CATH URINE  Final   Special Requests   Final    NONE Performed at [facility], [place] [address], [place], [place] [address]    Culture MULTIPLE SPECIES PRESENT, SUGGEST RECOLLECTION (A)  Final   Report Status 11/26/2019 FINAL  Final         Radiology Studies: DG Chest 2 View  Result Date: 11/26/2019 CLINICAL DATA:  Cough, chest pain. EXAM: CHEST - 2 VIEW COMPARISON:  November 25, 2019. FINDINGS: The heart size and mediastinal contours are within normal limits. No pneumothorax or pleural effusion is noted. Mild right basilar subsegmental atelectasis or infiltrate is noted. Left lung is unremarkable. The visualized skeletal structures are unremarkable. IMPRESSION: Mild right basilar subsegmental atelectasis or infiltrate is noted. Electronically Signed   By: Marijo Conception M.D.   On:  11/26/2019 09:57   DG Chest 2 View  Result Date: 11/25/2019 CLINICAL DATA:  Shortness of breath. Leukocytosis. History of CHF and diabetes. EXAM: CHEST - 2 VIEW COMPARISON:  11/23/2019. FINDINGS: Mediastinum hilar structures are unremarkable. Heart size normal. Low lung volumes with mild right base atelectasis/infiltrate, improved from prior exam. Mild thoracic spine scoliosis concave left. IMPRESSION: Low lung volumes. Mild right base atelectasis/infiltrate again noted. Interim improvement from prior exam. Electronically Signed   By: Marcello Moores  Register   On: 11/25/2019 08:07   NM Pulmonary Perfusion  Result Date: 11/25/2019 CLINICAL DATA:  Chest pain, cough EXAM: NUCLEAR MEDICINE PERFUSION LUNG SCAN  TECHNIQUE: Perfusion images were obtained in multiple projections after intravenous injection of radiopharmaceutical. Ventilation scans intentionally deferred if perfusion scan and chest x-ray adequate for interpretation during COVID 19 epidemic. RADIOPHARMACEUTICALS:  4.1 mCi Tc-60m MAA IV COMPARISON:  Chest x-ray 11/25/2019 FINDINGS: Adequate uptake of radiopharmaceutical within the lung fields. Segmental perfusion defect within the posterior segment of the right upper lobe. Small nonsegmental defect in the left apicoposterior segment. No corresponding airspace opacity within the upper lobes on chest radiograph. IMPRESSION: Intermediate probability for pulmonary embolism. Consider further evaluation with lower extremity venous ultrasound and/or CT angiogram of the chest. These results will be called to the ordering clinician or representative by the Radiologist Assistant, and communication documented in the PACS or [employer]. Electronically Signed   By: Davina Poke D.O.   On: 11/25/2019 13:14        Scheduled Meds: . amLODipine  10 mg Oral Daily  . azithromycin  500 mg Oral Daily  . fluticasone  2 spray Each Nare Daily  . insulin aspart  0-15 Units Subcutaneous TID WC  . insulin aspart  0-5  Units Subcutaneous QHS  . insulin aspart  5 Units Subcutaneous TID WC  . insulin glargine  35 Units Subcutaneous Daily  . ipratropium-albuterol  3 mL Nebulization QID  . methylPREDNISolone (SOLU-MEDROL) injection  60 mg Intravenous Q12H  . pantoprazole  40 mg Oral Q1200  . pravastatin  80 mg Oral Daily  . Ensure Max Protein  11 oz Oral TID   Continuous Infusions: . sodium chloride 75 mL/hr at 11/26/19 0957  . cefTRIAXone (ROCEPHIN)  IV 2 g (11/26/19 0826)  . heparin 1,000 Units/hr (11/26/19 1012)     LOS: 2 days    Time spent: 25 minutes.   Domenic Polite, MD [facility]   11/26/2019, 12:31 PM

## 2019-11-27 LAB — BASIC METABOLIC PANEL
Anion gap: 9 (ref 5–15)
BUN: 28 mg/dL — ABNORMAL HIGH (ref 6–20)
CO2: 24 mmol/L (ref 22–32)
Calcium: 9 mg/dL (ref 8.9–10.3)
Chloride: 103 mmol/L (ref 98–111)
Creatinine, Ser: 1.48 mg/dL — ABNORMAL HIGH (ref 0.44–1.00)
GFR calc Af Amer: 49 mL/min — ABNORMAL LOW (ref >60)
GFR calc non Af Amer: 42 mL/min — ABNORMAL LOW (ref >60)
Glucose, Bld: 174 mg/dL — ABNORMAL HIGH (ref 70–99)
Potassium: 4.1 mmol/L (ref 3.5–5.1)
Sodium: 136 mmol/L (ref 135–145)

## 2019-11-27 LAB — GLUCOSE, CAPILLARY
Glucose-Capillary: 183 mg/dL — ABNORMAL HIGH (ref 70–99)
Glucose-Capillary: 222 mg/dL — ABNORMAL HIGH (ref 70–99)
Glucose-Capillary: 305 mg/dL — ABNORMAL HIGH (ref 70–99)
Glucose-Capillary: 317 mg/dL — ABNORMAL HIGH (ref 70–99)
Glucose-Capillary: 336 mg/dL — ABNORMAL HIGH (ref 70–99)
Glucose-Capillary: 403 mg/dL — ABNORMAL HIGH (ref 70–99)

## 2019-11-27 LAB — CBC
HCT: 36 % (ref 36.0–46.0)
Hemoglobin: 12.9 g/dL (ref 12.0–15.0)
MCH: 28.3 pg (ref 26.0–34.0)
MCHC: 35.8 g/dL (ref 30.0–36.0)
MCV: 78.9 fL — ABNORMAL LOW (ref 80.0–100.0)
Platelets: 461 10*3/uL — ABNORMAL HIGH (ref 150–400)
RBC: 4.56 MIL/uL (ref 3.87–5.11)
RDW: 13.8 % (ref 11.5–15.5)
WBC: 17.4 10*3/uL — ABNORMAL HIGH (ref 4.0–10.5)
nRBC: 0 % (ref 0.0–0.2)

## 2019-11-27 MED ORDER — INSULIN ASPART 100 UNIT/ML ~~LOC~~ SOLN
10.0000 [IU] | Freq: Once | SUBCUTANEOUS | Status: AC
  Administered 2019-11-27: 10 [IU] via SUBCUTANEOUS

## 2019-11-27 MED ORDER — IPRATROPIUM-ALBUTEROL 0.5-2.5 (3) MG/3ML IN SOLN
3.0000 mL | Freq: Three times a day (TID) | RESPIRATORY_TRACT | Status: DC
  Administered 2019-11-27: 3 mL via RESPIRATORY_TRACT
  Filled 2019-11-27: qty 3

## 2019-11-27 MED ORDER — METHYLPREDNISOLONE SODIUM SUCC 40 MG IJ SOLR
40.0000 mg | Freq: Two times a day (BID) | INTRAMUSCULAR | Status: DC
  Administered 2019-11-27 – 2019-11-28 (×3): 40 mg via INTRAVENOUS
  Filled 2019-11-27 (×3): qty 1

## 2019-11-27 MED ORDER — INSULIN ASPART 100 UNIT/ML ~~LOC~~ SOLN
6.0000 [IU] | Freq: Three times a day (TID) | SUBCUTANEOUS | Status: DC
  Administered 2019-11-27 (×2): 6 [IU] via SUBCUTANEOUS

## 2019-11-27 MED ORDER — INSULIN GLARGINE 100 UNIT/ML ~~LOC~~ SOLN
40.0000 [IU] | Freq: Every day | SUBCUTANEOUS | Status: DC
  Administered 2019-11-27: 40 [IU] via SUBCUTANEOUS
  Filled 2019-11-27 (×2): qty 0.4

## 2019-11-27 NOTE — Progress Notes (Signed)
PT Cancellation Note  Patient Details Name: Shirley [name] MRN: [id number] DOB: Nov 06, 1975   Cancelled Treatment:    Reason Eval/Treat Not Completed: Patient declined, no reason specified. Pt requesting therapist return at a later time as she was just given meds and reporting that she was feeling "loopy". PT will continue to f/u with pt acutely as available.    [place] 11/27/2019, 11:14 AM

## 2019-11-27 NOTE — Progress Notes (Signed)
PT Cancellation Note  Patient Details Name: Shirley [name] MRN: [id number] DOB: 04-01-1976   Cancelled Treatment:    Reason Eval/Treat Not Completed: Patient declined, no reason specified. Pt sleeping upon arrival but easily awakened and again refusing to participate in PT evaluation at this time. Requesting therapist return tomorrow.    [place] 11/27/2019, 2:31 PM

## 2019-11-27 NOTE — Plan of Care (Signed)
  Problem: Clinical Measurements: Goal: Diagnostic test results will improve Outcome: Progressing   

## 2019-11-27 NOTE — Progress Notes (Signed)
PROGRESS NOTE    Shirley [name]  RFF:[id number] DOB: [birth year] DOA: 11/23/2019 PCP: Fredrich Romans, PA   Brief Narrative: 44 year old with past medical history significant for hypertension, hyperlipidemia, CAD, CVA, diabetes type 2 uncontrolled, peripheral neuropathy and gastroparesis, hepatitis B history of PE DVT, OSA and obesity presented with multiple complaints including chest pain, abdominal pain, productive cough, wheezing nausea vomiting for 3 days.  She also reports suprasternal chest pain.  Also epigastric left-sided abdominal pain nausea and vomiting. Patient in the ED was afebrile, tachycardic heart rate 125 mild tachypnea respiration rate 22 oxygen saturation on room air, white blood cell 12, BUN 28, creatinine 2.1.  Chest x-ray revealed low lung volume bilateral infiltrates. COVID-19 positive test  Negative.  Admitted with sepsis secondary to pneumonia,  VQ scan intermediate risk.  Patient was a started on IV heparin.  Assessment & Plan:   Sepsis secondary to viral pneumonia Asthma exacerbation Acute hypoxic respiratory failure : Patient presented with productive cough, shortness of breath, wheezing myalgias, anorexia  -Chest x-ray noted bilateral opacities concerning for pneumonia -Covid PCR is negative, respiratory virus panel positive for RSV -Discontinue ceftriaxone, continue azithromycin for 2 more days -Wheezing improving, cut down IV Solu-Medrol, continue Mucinex and Tessalon Perles  -Duo nebs  -Ambulate, out of bed to chair, wean O2 as tolerated  Abnormal D-dimer -Had a VQ scan on on admission which showed intermediate probability of PE -Clinically her symptoms are concerned consistent with pneumonia and asthma exacerbation, has positive RSV which explains her symptoms -Lower extremity Dopplers negative for DVT -Discontinued IV heparin  Abdominal pain: related to gastroparesis, coughing -Advance diet as tolerated  AKI on CKD stage IIIa; Creatinine on admission  2.1, previous baseline 1.1--1.2 -Cut down IV fluids  Diabetes type 2: Uncontrolled Hemoglobin A1c is 12.0 indicating long-term poor control -CBGs high in the setting of IV steroids, increase Lantus, increase meal coverage -Continue sliding scale  Uncontrolled hypertension Held losartan due to AKI Amlodipine dose increased, monitor  Thrombocytosis leukocytosis: Acute on chronic. -Would benefit from outpatient heme referral  Polysubstance abuse: UDS positive.  Counseled  Chronic pain syndrome: On Percocet at baseline, this was resumed  Hyperlipidemia: Continue with pravastatin  Hepatitis B chronic   Estimated body mass index is 35.91 kg/m as calculated from the following:   Height as of this encounter: 5\' 7"  (1.702 m).   Weight as of this encounter: 104 kg.   DVT prophylaxis: Heparin drip Code Status: Full code Family Communication: Care discussed with patient Disposition Plan:  Status is: Inpatient  Remains inpatient appropriate because: Acute illness   Dispo: The patient is from: Home              Anticipated d/c is to: Home              Anticipated d/c date is: 1 to 2 days              Patient currently is not medically stable to d/c.   Consultants:   None  Procedures:   None  Antimicrobials:  Ceftriaxone and azithromycin 8/03  Subjective: -Continues to have productive cough, breathing is improving, less chest tightness, abdominal pain is also better  Objective: Vitals:   11/27/19 0623 11/27/19 0838 11/27/19 0955 11/27/19 1124  BP: (!) 150/96  (!) 145/83   Pulse: 84 88 98 94  Resp: 18 (!) 21 16 (!) 22  Temp: 97.7 F (36.5 C)  (!) 97.5 F (36.4 C)   TempSrc: Oral  Oral  SpO2: 99% 97% 100% 99%  Weight:      Height:        Intake/Output Summary (Last 24 hours) at 11/27/2019 1231 Last data filed at 11/27/2019 0900 Gross per 24 hour  Intake 1400 ml  Output 400 ml  Net 1000 ml   Filed Weights   11/23/19 1820  Weight: (!) 104 kg     Examination:  General exam: Pleasant female laying in bed, uncomfortable appearing, AAOx3 HEENT: No JVD CVS: S1-S2, regular rhythm Lungs: Improving air movement, few basilar rhonchi and expiratory wheezes Abdomen: Soft, nontender, bowel sounds present  Extremities: No edema Psych: Appropriate mood and affect   Data Reviewed: I have personally reviewed following labs and imaging studies  CBC: Recent Labs  Lab 11/23/19 1852 11/25/19 0349 11/26/19 0641 11/27/19 0903  WBC 12.1* 14.9* 15.3* 17.4*  HGB 14.3 12.6 11.6* 12.9  HCT 41.3 36.4 32.9* 36.0  MCV 81.9 79.0* 79.9* 78.9*  PLT 427* 390 412* 962*   Basic Metabolic Panel: Recent Labs  Lab 11/23/19 1852 11/24/19 0535 11/25/19 0349 11/26/19 0641 11/27/19 0903  NA 136 136 133* 137 136  K 4.1 4.1 3.6 3.6 4.1  CL 104 101 101 105 103  CO2 17* 19* 22 22 24   GLUCOSE 211* 240* 339* 199* 174*  BUN 18 28* 33* 29* 28*  CREATININE 1.64* 2.13* 1.84* 1.61* 1.48*  CALCIUM 9.2 9.6 8.7* 8.7* 9.0   GFR: Estimated Creatinine Clearance: 58.9 mL/min (A) (by C-G formula based on SCr of 1.48 mg/dL (H)). Liver Function Tests: Recent Labs  Lab 11/24/19 0535  AST 24  ALT 12  ALKPHOS 63  BILITOT 1.4*  PROT 8.6*  ALBUMIN 3.1*   Recent Labs  Lab 11/23/19 1852  LIPASE 19   No results for input(s): AMMONIA in the last 168 hours. Coagulation Profile: Recent Labs  Lab 11/24/19 0535  INR 1.1   Cardiac Enzymes: Recent Labs  Lab 11/24/19 1126 11/26/19 0641  CKTOTAL 477* 195   BNP (last 3 results) No results for input(s): PROBNP in the last 8760 hours. HbA1C: Recent Labs    11/25/19 2203  HGBA1C 12.0*   CBG: Recent Labs  Lab 11/26/19 2226 11/27/19 0030 11/27/19 0205 11/27/19 0701 11/27/19 1112  GLUCAP 411* 403* 317* 183* 222*   Lipid Profile: No results for input(s): CHOL, HDL, LDLCALC, TRIG, CHOLHDL, LDLDIRECT in the last 72 hours. Thyroid Function Tests: No results for input(s): TSH, T4TOTAL, FREET4,  T3FREE, THYROIDAB in the last 72 hours. Anemia Panel: No results for input(s): VITAMINB12, FOLATE, FERRITIN, TIBC, IRON, RETICCTPCT in the last 72 hours. Sepsis Labs: Recent Labs  Lab 11/24/19 0512 11/24/19 0739 11/26/19 0734  PROCALCITON  --   --  <0.10  LATICACIDVEN 1.9 1.6  --     Recent Results (from the past 240 hour(s))  Blood Culture (routine x 2)     Status: None (Preliminary result)   Collection Time: 11/24/19  5:35 AM   Specimen: BLOOD  Result Value Ref Range Status   Specimen Description BLOOD SITE NOT SPECIFIED  Final   Special Requests   Final    BOTTLES DRAWN AEROBIC AND ANAEROBIC Blood Culture adequate volume   Culture   Final    NO GROWTH 2 DAYS Performed at [facility], [address] [place], [place] [address]    Report Status PENDING  Incomplete  SARS Coronavirus 2 by RT PCR (hospital order, performed in [facility] hospital lab) Nasopharyngeal Nasopharyngeal Swab     Status:  None   Collection Time: 11/24/19  5:43 AM   Specimen: Nasopharyngeal Swab  Result Value Ref Range Status   SARS Coronavirus 2 NEGATIVE NEGATIVE Final    Comment: (NOTE) SARS-CoV-2 target nucleic acids are NOT DETECTED.  The SARS-CoV-2 RNA is generally detectable in upper and lower respiratory specimens during the acute phase of infection. The lowest concentration of SARS-CoV-2 viral copies this assay can detect is 250 copies / mL. A negative result does not preclude SARS-CoV-2 infection and should not be used as the sole basis for treatment or other patient management decisions.  A negative result may occur with improper specimen collection / handling, submission of specimen other than nasopharyngeal swab, presence of viral mutation(s) within the areas targeted by this assay, and inadequate number of viral copies (<250 copies / mL). A negative result must be combined with clinical observations, patient history, and epidemiological information.  Fact Sheet for Patients:    StrictlyIdeas.no  Fact Sheet for Healthcare Providers: BankingDealers.co.za  This test is not yet approved or  cleared by the [place] FDA and has been authorized for detection and/or diagnosis of SARS-CoV-2 by FDA under an Emergency Use Authorization (EUA).  This EUA will remain in effect (meaning this test can be used) for the duration of the COVID-19 declaration under Section 564(b)(1) of the Act, 21 U.S.C. section 360bbb-3(b)(1), unless the authorization is terminated or revoked sooner.  Performed at [facility], [place] [address] [place], [place] [address]   Blood Culture (routine x 2)     Status: None (Preliminary result)   Collection Time: 11/24/19  7:39 AM   Specimen: BLOOD  Result Value Ref Range Status   Specimen Description BLOOD RIGHT ANTECUBITAL  Final   Special Requests   Final    BOTTLES DRAWN AEROBIC ONLY Blood Culture results may not be optimal due to an inadequate volume of blood received in culture bottles   Culture   Final    NO GROWTH 2 DAYS Performed at [facility], [place] [address] [place], [place] [address]    Report Status PENDING  Incomplete  Urine culture     Status: Abnormal   Collection Time: 11/25/19  3:44 AM   Specimen: In/Out Cath Urine  Result Value Ref Range Status   Specimen Description IN/OUT CATH URINE  Final   Special Requests   Final    NONE Performed at [facility], [place] [address] [place], [place] [address]    Culture MULTIPLE SPECIES PRESENT, SUGGEST RECOLLECTION (A)  Final   Report Status 11/26/2019 FINAL  Final  Respiratory Panel by PCR     Status: Abnormal   Collection Time: 11/26/19  5:57 PM   Specimen: Nasopharyngeal Swab; Respiratory  Result Value Ref Range Status   Adenovirus NOT DETECTED NOT DETECTED Final   Coronavirus 229E NOT DETECTED NOT DETECTED Final    Comment: (NOTE) The Coronavirus on the Respiratory Panel, DOES NOT test for the novel   Coronavirus (2019 nCoV)    Coronavirus HKU1 NOT DETECTED NOT DETECTED Final   Coronavirus NL63 NOT DETECTED NOT DETECTED Final   Coronavirus OC43 NOT DETECTED NOT DETECTED Final   Metapneumovirus NOT DETECTED NOT DETECTED Final   Rhinovirus / Enterovirus NOT DETECTED NOT DETECTED Final   Influenza A NOT DETECTED NOT DETECTED Final   Influenza B NOT DETECTED NOT DETECTED Final   Parainfluenza Virus 1 NOT DETECTED NOT DETECTED Final   Parainfluenza Virus 2 NOT DETECTED NOT DETECTED Final  Parainfluenza Virus 3 NOT DETECTED NOT DETECTED Final   Parainfluenza Virus 4 NOT DETECTED NOT DETECTED Final   Respiratory Syncytial Virus DETECTED (A) NOT DETECTED Final   Bordetella pertussis NOT DETECTED NOT DETECTED Final   Chlamydophila pneumoniae NOT DETECTED NOT DETECTED Final   Mycoplasma pneumoniae NOT DETECTED NOT DETECTED Final    Comment: Performed at [facility], [place] [address], [place], [place] [address]         Radiology Studies: DG Chest 2 View  Result Date: 11/26/2019 CLINICAL DATA:  Cough, chest pain. EXAM: CHEST - 2 VIEW COMPARISON:  November 25, 2019. FINDINGS: The heart size and mediastinal contours are within normal limits. No pneumothorax or pleural effusion is noted. Mild right basilar subsegmental atelectasis or infiltrate is noted. Left lung is unremarkable. The visualized skeletal structures are unremarkable. IMPRESSION: Mild right basilar subsegmental atelectasis or infiltrate is noted. Electronically Signed   By: Marijo Conception M.D.   On: 11/26/2019 09:57   NM Pulmonary Perfusion  Result Date: 11/25/2019 CLINICAL DATA:  Chest pain, cough EXAM: NUCLEAR MEDICINE PERFUSION LUNG SCAN TECHNIQUE: Perfusion images were obtained in multiple projections after intravenous injection of radiopharmaceutical. Ventilation scans intentionally deferred if perfusion scan and chest x-ray adequate for interpretation during COVID 19 epidemic. RADIOPHARMACEUTICALS:  4.1 mCi Tc-30m MAA IV  COMPARISON:  Chest x-ray 11/25/2019 FINDINGS: Adequate uptake of radiopharmaceutical within the lung fields. Segmental perfusion defect within the posterior segment of the right upper lobe. Small nonsegmental defect in the left apicoposterior segment. No corresponding airspace opacity within the upper lobes on chest radiograph. IMPRESSION: Intermediate probability for pulmonary embolism. Consider further evaluation with lower extremity venous ultrasound and/or CT angiogram of the chest. These results will be called to the ordering clinician or representative by the Radiologist Assistant, and communication documented in the PACS or [employer]. Electronically Signed   By: Davina Poke D.O.   On: 11/25/2019 13:14   VAS [place] LOWER EXTREMITY VENOUS (DVT)  Result Date: 11/26/2019  Lower Venous DVTStudy Indications: Elevated d-dimer, edema.  Risk Factors: Suspected PE History of DVT/PE. Comparison Study: Prior study 01-14-2017 LEV Bilateral- partial RT Pop and Pero Performing Technologist: Darlin Coco  Examination Guidelines: A complete evaluation includes B-mode imaging, spectral Doppler, color Doppler, and power Doppler as needed of all accessible portions of each vessel. Bilateral testing is considered an integral part of a complete examination. Limited examinations for reoccurring indications may be performed as noted. The reflux portion of the exam is performed with the patient in reverse Trendelenburg.  +---------+---------------+---------+-----------+----------+------------------+ RIGHT    CompressibilityPhasicitySpontaneityPropertiesThrombus Aging     +---------+---------------+---------+-----------+----------+------------------+ CFV      Full           Yes      Yes                                     +---------+---------------+---------+-----------+----------+------------------+ SFJ      Full                                                             +---------+---------------+---------+-----------+----------+------------------+ FV Prox  Full                                                            +---------+---------------+---------+-----------+----------+------------------+  FV Mid   Full                                                            +---------+---------------+---------+-----------+----------+------------------+ FV DistalFull                                                            +---------+---------------+---------+-----------+----------+------------------+ PFV      Full                                                            +---------+---------------+---------+-----------+----------+------------------+ POP      Full           Yes      Yes                  Fibrin stranding                                                         noted              +---------+---------------+---------+-----------+----------+------------------+ PTV      Full                                                            +---------+---------------+---------+-----------+----------+------------------+ PERO     Full                                                            +---------+---------------+---------+-----------+----------+------------------+   +---------+---------------+---------+-----------+----------+--------------+ LEFT     CompressibilityPhasicitySpontaneityPropertiesThrombus Aging +---------+---------------+---------+-----------+----------+--------------+ CFV      Full           Yes      Yes                                 +---------+---------------+---------+-----------+----------+--------------+ SFJ      Full                                                        +---------+---------------+---------+-----------+----------+--------------+ FV Prox  Full                                                         +---------+---------------+---------+-----------+----------+--------------+  FV Mid   Full                                                        +---------+---------------+---------+-----------+----------+--------------+ FV DistalFull                                                        +---------+---------------+---------+-----------+----------+--------------+ PFV      Full                                                        +---------+---------------+---------+-----------+----------+--------------+ POP      Full           Yes      Yes                                 +---------+---------------+---------+-----------+----------+--------------+ PTV      Full                                                        +---------+---------------+---------+-----------+----------+--------------+ PERO     Full                                                        +---------+---------------+---------+-----------+----------+--------------+     Summary: RIGHT: - There is no evidence of deep vein thrombosis in the lower extremity.  - No cystic structure found in the popliteal fossa.  LEFT: - There is no evidence of deep vein thrombosis in the lower extremity.  - No cystic structure found in the popliteal fossa.  *See table(s) above for measurements and observations. Electronically signed by Harold Barban MD on 11/26/2019 at 4:54:02 PM.    Final         Scheduled Meds: . amLODipine  10 mg Oral Daily  . azithromycin  500 mg Oral Daily  . benzonatate  100 mg Oral BID  . dextromethorphan-guaiFENesin  1 tablet Oral BID  . fluticasone  2 spray Each Nare Daily  . heparin injection (subcutaneous)  5,000 Units Subcutaneous Q8H  . insulin aspart  0-15 Units Subcutaneous TID WC  . insulin aspart  0-5 Units Subcutaneous QHS  . insulin aspart  6 Units Subcutaneous TID WC  . insulin glargine  40 Units Subcutaneous Daily  . ipratropium-albuterol  3 mL Nebulization QID  .  methylPREDNISolone (SOLU-MEDROL) injection  40 mg Intravenous BID  . pantoprazole  40 mg Oral Q1200  . polyethylene glycol  17 g Oral Daily  . pravastatin  80 mg Oral Daily  . Ensure Max Protein  11 oz Oral TID  . senna-docusate  1 tablet Oral  BID   Continuous Infusions:    LOS: 3 days    Time spent: 25 minutes.   Domenic Polite, MD [facility]   11/27/2019, 12:31 PM

## 2019-11-28 LAB — GLUCOSE, CAPILLARY
Glucose-Capillary: 371 mg/dL — ABNORMAL HIGH (ref 70–99)
Glucose-Capillary: 137 mg/dL — ABNORMAL HIGH (ref 70–99)
Glucose-Capillary: 78 mg/dL (ref 70–99)
Glucose-Capillary: 206 mg/dL — ABNORMAL HIGH (ref 70–99)

## 2019-11-28 LAB — CBC
HCT: 35.1 % — ABNORMAL LOW (ref 36.0–46.0)
Hemoglobin: 12.1 g/dL (ref 12.0–15.0)
MCH: 27.6 pg (ref 26.0–34.0)
MCHC: 34.5 g/dL (ref 30.0–36.0)
MCV: 80 fL (ref 80.0–100.0)
Platelets: 376 10*3/uL (ref 150–400)
RBC: 4.39 MIL/uL (ref 3.87–5.11)
RDW: 13.7 % (ref 11.5–15.5)
WBC: 18.4 10*3/uL — ABNORMAL HIGH (ref 4.0–10.5)
nRBC: 0.1 % (ref 0.0–0.2)

## 2019-11-28 MED ORDER — PREDNISONE 50 MG PO TABS
50.0000 mg | ORAL_TABLET | Freq: Every day | ORAL | Status: DC
  Administered 2019-11-29: 50 mg via ORAL
  Filled 2019-11-28: qty 1

## 2019-11-28 MED ORDER — IPRATROPIUM-ALBUTEROL 0.5-2.5 (3) MG/3ML IN SOLN
3.0000 mL | Freq: Four times a day (QID) | RESPIRATORY_TRACT | Status: DC | PRN
  Administered 2019-11-29: 3 mL via RESPIRATORY_TRACT
  Filled 2019-11-28: qty 3

## 2019-11-28 MED ORDER — INSULIN GLARGINE 100 UNIT/ML ~~LOC~~ SOLN
45.0000 [IU] | Freq: Every day | SUBCUTANEOUS | Status: DC
  Administered 2019-11-28 – 2019-11-30 (×3): 45 [IU] via SUBCUTANEOUS
  Filled 2019-11-28 (×3): qty 0.45

## 2019-11-28 MED ORDER — INSULIN ASPART 100 UNIT/ML ~~LOC~~ SOLN
8.0000 [IU] | Freq: Three times a day (TID) | SUBCUTANEOUS | Status: DC
  Administered 2019-11-28 – 2019-11-30 (×4): 8 [IU] via SUBCUTANEOUS

## 2019-11-28 MED ORDER — BENZONATATE 100 MG PO CAPS
200.0000 mg | ORAL_CAPSULE | Freq: Three times a day (TID) | ORAL | Status: DC
  Administered 2019-11-28 – 2019-11-30 (×6): 200 mg via ORAL
  Filled 2019-11-28 (×6): qty 2

## 2019-11-28 NOTE — Progress Notes (Signed)
PROGRESS NOTE    Shirley [name]  ZHY:[id number] DOB: 02/15/1976 DOA: 11/23/2019 PCP: Fredrich Romans, PA   Brief Narrative: 44 year old with past medical history significant for hypertension, hyperlipidemia, CAD, CVA, diabetes type 2 uncontrolled, peripheral neuropathy and gastroparesis, hepatitis B history of PE DVT, OSA and obesity presented with multiple complaints including chest pain, abdominal pain, productive cough, wheezing nausea vomiting for 3 days.  She also reports suprasternal chest pain.  Also epigastric left-sided abdominal pain nausea and vomiting. Patient in the ED was afebrile, tachycardic heart rate 125 mild tachypnea respiration rate 22 oxygen saturation on room air, white blood cell 12, BUN 28, creatinine 2.1.  Chest x-ray revealed low lung volume bilateral infiltrates. COVID-19 positive test  Negative.  Admitted with sepsis secondary to pneumonia,  VQ scan intermediate risk.  Patient was a started on IV heparin.  Assessment & Plan:   Sepsis secondary to viral pneumonia Asthma exacerbation Acute hypoxic respiratory failure - presented with productive cough, shortness of breath, wheezing, fever, myalgias, anorexia  -Chest x-ray noted bilateral opacities concerning for pneumonia -Covid PCR negative, respiratory virus panel positive for RSV -Discontinued ceftriaxone, completes 5 days of azithromycin today -Wheezing improving, still having bad coughing spells -Stop IV Solu-Medrol and transition to prednisone taper today -Continue duo nebs, Mucinex and Tessalon Perles -Attempt to wean off O2 today, ambulate -Discharge planning  Abnormal D-dimer -Had a VQ scan on on admission which showed intermediate probability of PE -Clinically her symptoms are concerned consistent with pneumonia and asthma exacerbation, has positive RSV which explains her symptoms -Lower extremity Dopplers negative for DVT -Discontinued IV heparin, now on DVT prophylaxis  Abdominal pain: related to  gastroparesis, coughing -Advance diet as tolerated  AKI on CKD stage IIIa; Creatinine on admission 2.1, previous baseline 1.1--1.2 -Cut down IV fluids  Diabetes type 2: Uncontrolled Hemoglobin A1c is 12.0 indicating long-term poor control -CBGs high in the setting of IV steroids, increase Lantus, increase meal coverage -Continue sliding scale  Uncontrolled hypertension Held losartan due to AKI Amlodipine dose increased, monitor  Thrombocytosis leukocytosis: Acute on chronic. -Would benefit from outpatient heme referral  Polysubstance abuse: UDS positive.  Counseled  Chronic pain syndrome: On Percocet at baseline, this was resumed  Hyperlipidemia: Continue with pravastatin  Hepatitis B chronic   Estimated body mass index is 35.91 kg/m as calculated from the following:   Height as of this encounter: 5\' 7"  (1.702 m).   Weight as of this encounter: 104 kg.   DVT prophylaxis: Heparin SQ Code Status: Full code Family Communication: Care discussed with patient Disposition Plan:  Status is: Inpatient  Remains inpatient appropriate because: Acute illness   Dispo: The patient is from: Home              Anticipated d/c is to: Home              Anticipated d/c date is: 8/7 if respiratory status continues to improve              Patient currently is not medically stable to d/c.   Consultants:   None  Procedures:   None  Antimicrobials:  Ceftriaxone and azithromycin 8/03  Subjective: -Feels a Viele better, unfortunately still having bad hacking cough with shortness of breath during coughing spells Objective: Vitals:   11/27/19 1645 11/27/19 2043 11/28/19 0444 11/28/19 0920  BP: 135/79 (!) 155/94 (!) 172/100 (!) 159/94  Pulse: 94 (!) 101 95 100  Resp: 18 18 18 20   Temp:  98.2 F (36.8 C)  98.1 F (36.7 C) 98.4 F (36.9 C)  TempSrc:    Oral  SpO2: 100% 98% 100% 99%  Weight:      Height:        Intake/Output Summary (Last 24 hours) at 11/28/2019 1138 Last  data filed at 11/28/2019 0844 Gross per 24 hour  Intake 720 ml  Output 0 ml  Net 720 ml   Filed Weights   11/23/19 1820  Weight: (!) 104 kg    Examination:  General exam: Pleasant female sitting up in bed, uncomfortable appearing, AAO x3 HEENT: No JVD CVS: S1-S2, regular rate rhythm Lungs: Improving air movement, basilar rhonchi, no expiratory wheezes today Abdomen: Soft, nontender, bowel sounds present Extremities: No edema Psych: Appropriate mood and affect   Data Reviewed: I have personally reviewed following labs and imaging studies  CBC: Recent Labs  Lab 11/23/19 1852 11/25/19 0349 11/26/19 0641 11/27/19 0903 11/28/19 0814  WBC 12.1* 14.9* 15.3* 17.4* 18.4*  HGB 14.3 12.6 11.6* 12.9 12.1  HCT 41.3 36.4 32.9* 36.0 35.1*  MCV 81.9 79.0* 79.9* 78.9* 80.0  PLT 427* 390 412* 461* 681   Basic Metabolic Panel: Recent Labs  Lab 11/23/19 1852 11/24/19 0535 11/25/19 0349 11/26/19 0641 11/27/19 0903  NA 136 136 133* 137 136  K 4.1 4.1 3.6 3.6 4.1  CL 104 101 101 105 103  CO2 17* 19* 22 22 24   GLUCOSE 211* 240* 339* 199* 174*  BUN 18 28* 33* 29* 28*  CREATININE 1.64* 2.13* 1.84* 1.61* 1.48*  CALCIUM 9.2 9.6 8.7* 8.7* 9.0   GFR: Estimated Creatinine Clearance: 58.9 mL/min (A) (by C-G formula based on SCr of 1.48 mg/dL (H)). Liver Function Tests: Recent Labs  Lab 11/24/19 0535  AST 24  ALT 12  ALKPHOS 63  BILITOT 1.4*  PROT 8.6*  ALBUMIN 3.1*   Recent Labs  Lab 11/23/19 1852  LIPASE 19   No results for input(s): AMMONIA in the last 168 hours. Coagulation Profile: Recent Labs  Lab 11/24/19 0535  INR 1.1   Cardiac Enzymes: Recent Labs  Lab 11/24/19 1126 11/26/19 0641  CKTOTAL 477* 195   BNP (last 3 results) No results for input(s): PROBNP in the last 8760 hours. HbA1C: Recent Labs    11/25/19 2203  HGBA1C 12.0*   CBG: Recent Labs  Lab 11/27/19 1112 11/27/19 1653 11/27/19 2044 11/28/19 0654 11/28/19 1113  GLUCAP 222* 336* 305*  371* 137*   Lipid Profile: No results for input(s): CHOL, HDL, LDLCALC, TRIG, CHOLHDL, LDLDIRECT in the last 72 hours. Thyroid Function Tests: No results for input(s): TSH, T4TOTAL, FREET4, T3FREE, THYROIDAB in the last 72 hours. Anemia Panel: No results for input(s): VITAMINB12, FOLATE, FERRITIN, TIBC, IRON, RETICCTPCT in the last 72 hours. Sepsis Labs: Recent Labs  Lab 11/24/19 0512 11/24/19 0739 11/26/19 0734  PROCALCITON  --   --  <0.10  LATICACIDVEN 1.9 1.6  --     Recent Results (from the past 240 hour(s))  Blood Culture (routine x 2)     Status: None (Preliminary result)   Collection Time: 11/24/19  5:35 AM   Specimen: BLOOD  Result Value Ref Range Status   Specimen Description BLOOD SITE NOT SPECIFIED  Final   Special Requests   Final    BOTTLES DRAWN AEROBIC AND ANAEROBIC Blood Culture adequate volume   Culture   Final    NO GROWTH 3 DAYS Performed at [facility], [address] [place],  [address]    Report Status PENDING  Incomplete  SARS Coronavirus 2 by RT PCR (hospital order, performed in [facility] hospital lab) Nasopharyngeal Nasopharyngeal Swab     Status: None   Collection Time: 11/24/19  5:43 AM   Specimen: Nasopharyngeal Swab  Result Value Ref Range Status   SARS Coronavirus 2 NEGATIVE NEGATIVE Final    Comment: (NOTE) SARS-CoV-2 target nucleic acids are NOT DETECTED.  The SARS-CoV-2 RNA is generally detectable in upper and lower respiratory specimens during the acute phase of infection. The lowest concentration of SARS-CoV-2 viral copies this assay can detect is 250 copies / mL. A negative result does not preclude SARS-CoV-2 infection and should not be used as the sole basis for treatment or other patient management decisions.  A negative result may occur with improper specimen collection / handling, submission of specimen other than nasopharyngeal swab, presence of viral mutation(s) within the areas targeted by this assay, and  inadequate number of viral copies (<250 copies / mL). A negative result must be combined with clinical observations, patient history, and epidemiological information.  Fact Sheet for Patients:   StrictlyIdeas.no  Fact Sheet for Healthcare Providers: BankingDealers.co.za  This test is not yet approved or  cleared by the [place] FDA and has been authorized for detection and/or diagnosis of SARS-CoV-2 by FDA under an Emergency Use Authorization (EUA).  This EUA will remain in effect (meaning this test can be used) for the duration of the COVID-19 declaration under Section 564(b)(1) of the Act, 21 U.S.C. section 360bbb-3(b)(1), unless the authorization is terminated or revoked sooner.  Performed at [facility], [place] [address] [place], [place] [address]   Blood Culture (routine x 2)     Status: None (Preliminary result)   Collection Time: 11/24/19  7:39 AM   Specimen: BLOOD  Result Value Ref Range Status   Specimen Description BLOOD RIGHT ANTECUBITAL  Final   Special Requests   Final    BOTTLES DRAWN AEROBIC ONLY Blood Culture results may not be optimal due to an inadequate volume of blood received in culture bottles   Culture   Final    NO GROWTH 3 DAYS Performed at [facility], [place] [address], [place], [place] [address]    Report Status PENDING  Incomplete  Urine culture     Status: Abnormal   Collection Time: 11/25/19  3:44 AM   Specimen: In/Out Cath Urine  Result Value Ref Range Status   Specimen Description IN/OUT CATH URINE  Final   Special Requests   Final    NONE Performed at [facility], [place] [address] [place],  [address]    Culture MULTIPLE SPECIES PRESENT, SUGGEST RECOLLECTION (A)  Final   Report Status 11/26/2019 FINAL  Final  Respiratory Panel by PCR     Status: Abnormal   Collection Time: 11/26/19  5:57 PM   Specimen: Nasopharyngeal Swab; Respiratory  Result Value Ref Range Status    Adenovirus NOT DETECTED NOT DETECTED Final   Coronavirus 229E NOT DETECTED NOT DETECTED Final    Comment: (NOTE) The Coronavirus on the Respiratory Panel, DOES NOT test for the novel  Coronavirus (2019 nCoV)    Coronavirus HKU1 NOT DETECTED NOT DETECTED Final   Coronavirus NL63 NOT DETECTED NOT DETECTED Final   Coronavirus OC43 NOT DETECTED NOT DETECTED Final   Metapneumovirus NOT DETECTED NOT DETECTED Final   Rhinovirus / Enterovirus NOT DETECTED NOT DETECTED Final   Influenza A NOT DETECTED NOT DETECTED Final   Influenza B NOT DETECTED NOT  DETECTED Final   Parainfluenza Virus 1 NOT DETECTED NOT DETECTED Final   Parainfluenza Virus 2 NOT DETECTED NOT DETECTED Final   Parainfluenza Virus 3 NOT DETECTED NOT DETECTED Final   Parainfluenza Virus 4 NOT DETECTED NOT DETECTED Final   Respiratory Syncytial Virus DETECTED (A) NOT DETECTED Final   Bordetella pertussis NOT DETECTED NOT DETECTED Final   Chlamydophila pneumoniae NOT DETECTED NOT DETECTED Final   Mycoplasma pneumoniae NOT DETECTED NOT DETECTED Final    Comment: Performed at [facility], [place] [address] [place], [place] [address]         Radiology Studies: VAS [place] LOWER EXTREMITY VENOUS (DVT)  Result Date: 11/26/2019  Lower Venous DVTStudy Indications: Elevated d-dimer, edema.  Risk Factors: Suspected PE History of DVT/PE. Comparison Study: Prior study 01-14-2017 LEV Bilateral- partial RT Pop and Pero Performing Technologist: Darlin Coco  Examination Guidelines: A complete evaluation includes B-mode imaging, spectral Doppler, color Doppler, and power Doppler as needed of all accessible portions of each vessel. Bilateral testing is considered an integral part of a complete examination. Limited examinations for reoccurring indications may be performed as noted. The reflux portion of the exam is performed with the patient in reverse Trendelenburg.  +---------+---------------+---------+-----------+----------+------------------+  RIGHT    CompressibilityPhasicitySpontaneityPropertiesThrombus Aging     +---------+---------------+---------+-----------+----------+------------------+ CFV      Full           Yes      Yes                                     +---------+---------------+---------+-----------+----------+------------------+ SFJ      Full                                                            +---------+---------------+---------+-----------+----------+------------------+ FV Prox  Full                                                            +---------+---------------+---------+-----------+----------+------------------+ FV Mid   Full                                                            +---------+---------------+---------+-----------+----------+------------------+ FV DistalFull                                                            +---------+---------------+---------+-----------+----------+------------------+ PFV      Full                                                            +---------+---------------+---------+-----------+----------+------------------+  POP      Full           Yes      Yes                  Fibrin stranding                                                         noted              +---------+---------------+---------+-----------+----------+------------------+ PTV      Full                                                            +---------+---------------+---------+-----------+----------+------------------+ PERO     Full                                                            +---------+---------------+---------+-----------+----------+------------------+   +---------+---------------+---------+-----------+----------+--------------+ LEFT     CompressibilityPhasicitySpontaneityPropertiesThrombus Aging +---------+---------------+---------+-----------+----------+--------------+ CFV      Full           Yes       Yes                                 +---------+---------------+---------+-----------+----------+--------------+ SFJ      Full                                                        +---------+---------------+---------+-----------+----------+--------------+ FV Prox  Full                                                        +---------+---------------+---------+-----------+----------+--------------+ FV Mid   Full                                                        +---------+---------------+---------+-----------+----------+--------------+ FV DistalFull                                                        +---------+---------------+---------+-----------+----------+--------------+ PFV      Full                                                        +---------+---------------+---------+-----------+----------+--------------+  POP      Full           Yes      Yes                                 +---------+---------------+---------+-----------+----------+--------------+ PTV      Full                                                        +---------+---------------+---------+-----------+----------+--------------+ PERO     Full                                                        +---------+---------------+---------+-----------+----------+--------------+     Summary: RIGHT: - There is no evidence of deep vein thrombosis in the lower extremity.  - No cystic structure found in the popliteal fossa.  LEFT: - There is no evidence of deep vein thrombosis in the lower extremity.  - No cystic structure found in the popliteal fossa.  *See table(s) above for measurements and observations. Electronically signed by Harold Barban MD on 11/26/2019 at 4:54:02 PM.    Final         Scheduled Meds: . amLODipine  10 mg Oral Daily  . benzonatate  200 mg Oral TID  . dextromethorphan-guaiFENesin  1 tablet Oral BID  . fluticasone  2 spray Each Nare Daily  . heparin  injection (subcutaneous)  5,000 Units Subcutaneous Q8H  . insulin aspart  0-15 Units Subcutaneous TID WC  . insulin aspart  0-5 Units Subcutaneous QHS  . insulin aspart  8 Units Subcutaneous TID WC  . insulin glargine  45 Units Subcutaneous Daily  . pantoprazole  40 mg Oral Q1200  . polyethylene glycol  17 g Oral Daily  . pravastatin  80 mg Oral Daily  . predniSONE  50 mg Oral Q breakfast  . Ensure Max Protein  11 oz Oral TID  . senna-docusate  1 tablet Oral BID   Continuous Infusions:    LOS: 4 days    Time spent: 25 minutes.   Domenic Polite, MD [facility]   11/28/2019, 11:38 AM

## 2019-11-28 NOTE — Evaluation (Signed)
Physical Therapy Evaluation Patient Details Name: Shirley [name] MRN: [id number] DOB: Feb 21, 1976 Today's Date: 11/28/2019   History of Present Illness  Pt is a 44 y/o female with PMH significant for hypertension, hyperlipidemia, CAD, CVA, diabetes type 2 uncontrolled, peripheral neuropathy and gastroparesis, hepatitis B history of PE DVT, OSA and obesity presented with multiple complaints including chest pain, abdominal pain, productive cough, wheezing nausea vomiting for 3 days. Pt found to have viral PNA.    Clinical Impression  Pt presented supine in bed with HOB elevated, awake and willing to participate in therapy session. Prior to admission, pt reported that she was independent with all functional mobility and ADLs. Pt lives alone in a an apartment with multiple flights of stairs to enter. She stated that she is working on getting a ground level apartment. At the time of evaluation, pt overall limited with mobility secondary to fatigue and weakness. She was able to perform transfers and short distance ambulation with supervision without the need for an AD. Encouraged pt to get OOB and ambulate in room throughout the day to help with her lung function and recovery. Pt agreeable. Pt would continue to benefit from skilled physical therapy services at this time while admitted and after d/c to address the below listed limitations in order to improve overall safety and independence with functional mobility.     Follow Up Recommendations Home health PT    Equipment Recommendations  None recommended by PT    Recommendations for Other Services       Precautions / Restrictions Precautions Precautions: Fall Restrictions Weight Bearing Restrictions: No      Mobility  Bed Mobility Overal bed mobility: Independent                Transfers Overall transfer level: Needs assistance Equipment used: None Transfers: Sit to/from Stand Sit to Stand: Supervision         General  transfer comment: pt cautious and slow with movement, but no instability noted  Ambulation/Gait Ambulation/Gait assistance: Supervision Gait Distance (Feet): 20 Feet Assistive device: None Gait Pattern/deviations: Step-through pattern;Decreased stride length Gait velocity: decreased   General Gait Details: pt able to ambulate to and from bathroom without use of an AD but frequently reaching out with UEs for support surfaces  Stairs            Wheelchair Mobility    Modified Rankin (Stroke Patients Only)       Balance Overall balance assessment: Needs assistance Sitting-balance support: Feet supported Sitting balance-Leahy Scale: Good     Standing balance support: During functional activity;No upper extremity supported Standing balance-Leahy Scale: Fair                               Pertinent Vitals/Pain Pain Assessment: Faces Faces Pain Scale: Hurts a Friebel bit Pain Location: chest (from coughing) Pain Descriptors / Indicators: Discomfort Pain Intervention(s): Monitored during session    Home Living Family/patient expects to be discharged to:: Private residence Living Arrangements: Alone Available Help at Discharge: Family;Available PRN/intermittently Type of Home: Apartment Home Access: Stairs to enter   Entrance Stairs-Number of Steps: multiple flights Home Layout: One level Home Equipment: None      Prior Function Level of Independence: Independent               Hand Dominance        Extremity/Trunk Assessment   Upper Extremity Assessment Upper Extremity Assessment: Overall WFL for  tasks assessed    Lower Extremity Assessment Lower Extremity Assessment: Overall WFL for tasks assessed    Cervical / Trunk Assessment Cervical / Trunk Assessment: Normal  Communication   Communication: No difficulties  Cognition Arousal/Alertness: Awake/alert Behavior During Therapy: WFL for tasks assessed/performed;Flat affect Overall  Cognitive Status: Within Functional Limits for tasks assessed                                        General Comments      Exercises     Assessment/Plan    PT Assessment Patient needs continued PT services  PT Problem List Decreased activity tolerance;Decreased balance;Decreased mobility;Cardiopulmonary status limiting activity       PT Treatment Interventions DME instruction;Stair training;Gait training;Functional mobility training;Therapeutic activities;Therapeutic exercise;Balance training;Neuromuscular re-education;Patient/family education    PT Goals (Current goals can be found in the Care Plan section)  Acute Rehab PT Goals Patient Stated Goal: "go home on Sunday" PT Goal Formulation: With patient Time For Goal Achievement: 12/12/19 Potential to Achieve Goals: Good    Frequency Min 3X/week   Barriers to discharge        Co-evaluation               AM-PAC PT "6 Clicks" Mobility  Outcome Measure Help needed turning from your back to your side while in a flat bed without using bedrails?: None Help needed moving from lying on your back to sitting on the side of a flat bed without using bedrails?: None Help needed moving to and from a bed to a chair (including a wheelchair)?: None Help needed standing up from a chair using your arms (e.g., wheelchair or bedside chair)?: None Help needed to walk in hospital room?: None Help needed climbing 3-5 steps with a railing? : A Petraglia 6 Click Score: 23    End of Session Equipment Utilized During Treatment: Oxygen Activity Tolerance: Patient limited by fatigue Patient left: in bed;with call bell/phone within reach Nurse Communication: Mobility status PT Visit Diagnosis: Other abnormalities of gait and mobility (R26.89)    Time: 6160-7371 PT Time Calculation (min) (ACUTE ONLY): 15 min   Charges:   PT Evaluation $PT Eval Low Complexity: 1 Low          Eduard Clos, PT, DPT  Acute Rehabilitation  Services Pager [phone] Office [place] 11/28/2019, 10:41 AM

## 2019-11-28 NOTE — Plan of Care (Signed)
  Problem: Education: Goal: Knowledge of General Education information will improve Description: Including pain rating scale, medication(s)/side effects and non-pharmacologic comfort measures Outcome: Completed/Met   Problem: Health Behavior/Discharge Planning: Goal: Ability to manage health-related needs will improve Outcome: Completed/Met   Problem: Clinical Measurements: Goal: Ability to maintain clinical measurements within normal limits will improve Outcome: Completed/Met Goal: Diagnostic test results will improve Outcome: Completed/Met Goal: Cardiovascular complication will be avoided Outcome: Completed/Met   Problem: Nutrition: Goal: Adequate nutrition will be maintained Outcome: Completed/Met   Problem: Coping: Goal: Level of anxiety will decrease Outcome: Completed/Met   Problem: Elimination: Goal: Will not experience complications related to bowel motility Outcome: Completed/Met Goal: Will not experience complications related to urinary retention Outcome: Completed/Met   Problem: Pain Managment: Goal: General experience of comfort will improve Outcome: Completed/Met   Problem: Skin Integrity: Goal: Risk for impaired skin integrity will decrease Outcome: Completed/Met

## 2019-11-29 LAB — BASIC METABOLIC PANEL
Anion gap: 8 (ref 5–15)
BUN: 26 mg/dL — ABNORMAL HIGH (ref 6–20)
CO2: 22 mmol/L (ref 22–32)
Calcium: 8.6 mg/dL — ABNORMAL LOW (ref 8.9–10.3)
Chloride: 105 mmol/L (ref 98–111)
Creatinine, Ser: 1.26 mg/dL — ABNORMAL HIGH (ref 0.44–1.00)
GFR calc Af Amer: 59 mL/min — ABNORMAL LOW (ref >60)
GFR calc non Af Amer: 51 mL/min — ABNORMAL LOW (ref >60)
Glucose, Bld: 156 mg/dL — ABNORMAL HIGH (ref 70–99)
Potassium: 4 mmol/L (ref 3.5–5.1)
Sodium: 135 mmol/L (ref 135–145)

## 2019-11-29 LAB — CBC
HCT: 34.2 % — ABNORMAL LOW (ref 36.0–46.0)
Hemoglobin: 11.9 g/dL — ABNORMAL LOW (ref 12.0–15.0)
MCH: 27.9 pg (ref 26.0–34.0)
MCHC: 34.8 g/dL (ref 30.0–36.0)
MCV: 80.1 fL (ref 80.0–100.0)
Platelets: 443 10*3/uL — ABNORMAL HIGH (ref 150–400)
RBC: 4.27 MIL/uL (ref 3.87–5.11)
RDW: 13.8 % (ref 11.5–15.5)
WBC: 18.1 10*3/uL — ABNORMAL HIGH (ref 4.0–10.5)
nRBC: 0 % (ref 0.0–0.2)

## 2019-11-29 LAB — CULTURE, BLOOD (ROUTINE X 2)
Culture: NO GROWTH
Culture: NO GROWTH
Special Requests: ADEQUATE

## 2019-11-29 LAB — GLUCOSE, CAPILLARY
Glucose-Capillary: 210 mg/dL — ABNORMAL HIGH (ref 70–99)
Glucose-Capillary: 72 mg/dL (ref 70–99)
Glucose-Capillary: 284 mg/dL — ABNORMAL HIGH (ref 70–99)
Glucose-Capillary: 296 mg/dL — ABNORMAL HIGH (ref 70–99)

## 2019-11-29 MED ORDER — PREDNISONE 20 MG PO TABS
40.0000 mg | ORAL_TABLET | Freq: Every day | ORAL | Status: DC
  Administered 2019-11-30: 40 mg via ORAL
  Filled 2019-11-29: qty 2

## 2019-11-29 NOTE — Plan of Care (Signed)
  Problem: Activity: Goal: Risk for activity intolerance will decrease Outcome: Progressing   

## 2019-11-29 NOTE — Progress Notes (Signed)
PROGRESS NOTE    Shirley [name]  UJW:[id number] DOB: [birth year] DOA: 11/23/2019 PCP: Fredrich Romans, PA   Brief Narrative: 44 year old with past medical history significant for hypertension, hyperlipidemia, CAD, CVA, diabetes type 2 uncontrolled, peripheral neuropathy and gastroparesis, hepatitis B history of PE DVT, OSA and obesity presented with multiple complaints including chest pain, abdominal pain, productive cough, wheezing nausea vomiting for 3 days.    Patient in the ED was afebrile, tachycardic heart rate 125 mild tachypnea respiration rate 22 oxygen saturation on room air, white blood cell 12, BUN 28, creatinine 2.1.  Chest x-ray revealed low lung volume bilateral infiltrates. COVID-19 Negative.  Admitted with sepsis secondary to viral pneumonia, RSV positive  Assessment & Plan:   Sepsis secondary to viral pneumonia Asthma exacerbation Acute hypoxic respiratory failure - presented with productive cough, shortness of breath, wheezing, fever, myalgias, anorexia  -Chest x-ray noted bilateral opacities concerning for pneumonia -Covid PCR negative, respiratory virus panel positive for RSV -Discontinued ceftriaxone, completed 5 days of azithromycin -Improving slowly, wheezing has resolved now, still having bad coughing spells -Continue to taper off prednisone, continue duo nebs, Mucinex and Tessalon Perles -Wean off O2 today -Ambulate -discharge planning -Patient has not been vaccinated for Covid, discussed importance of this  Abnormal D-dimer -Had a VQ scan on on admission which showed intermediate probability of PE -Clinically her symptoms are concerned consistent with pneumonia and asthma exacerbation, has positive RSV which explains her symptoms -Lower extremity Dopplers negative for DVT -Discontinued IV heparin, now on DVT prophylaxis  AKI on CKD stage IIIa; Creatinine on admission 2.1, previous baseline 1.1--1.2 -Back to baseline, fluids discontinued  Diabetes type 2:  Uncontrolled Hemoglobin A1c is 12.0 indicating long-term poor control -CBGs improving, continue Lantus and meal coverage  Uncontrolled hypertension Held losartan due to AKI Amlodipine dose increased, monitor  Thrombocytosis leukocytosis: Acute on chronic. -Would benefit from outpatient heme referral  Polysubstance abuse: UDS positive.  Counseled  Chronic pain syndrome: On Percocet at baseline, this was resumed  Hyperlipidemia: Continue with pravastatin  Hepatitis B chronic   Estimated body mass index is 35.91 kg/m as calculated from the following:   Height as of this encounter: 5\' 7"  (1.702 m).   Weight as of this encounter: 104 kg.   DVT prophylaxis: Heparin SQ Code Status: Full code Family Communication: Care discussed with patient Disposition Plan:  Status is: Inpatient  Remains inpatient appropriate because: Acute illness   Dispo: The patient is from: Home              Anticipated d/c is to: Home              Anticipated d/c date is: 8/8 if respiratory status continues to improve              Patient currently is not medically stable to d/c.   Consultants:   None  Procedures:   None  Antimicrobials:  Ceftriaxone and azithromycin 8/03  Subjective: -Feels only a Fryer better, still having bad coughing spells, less wheezing, less short of breath overall except when she has coughing spells  Objective: Vitals:   11/28/19 0920 11/28/19 1619 11/29/19 0458 11/29/19 0814  BP: (!) 159/94 120/78 137/78 (!) 162/101  Pulse: 100 76 70 98  Resp: 20 18 18 18   Temp: 98.4 F (36.9 C) 98.4 F (36.9 C) 98.2 F (36.8 C) 97.9 F (36.6 C)  TempSrc: Oral Oral Oral Oral  SpO2: 99% 99% 98% 100%  Weight:      Height:  Intake/Output Summary (Last 24 hours) at 11/29/2019 1023 Last data filed at 11/29/2019 0900 Gross per 24 hour  Intake 1140 ml  Output 0 ml  Net 1140 ml   Filed Weights   11/23/19 1820  Weight: (!) 104 kg    Examination:  General exam:  Pleasant female sitting up in bed, uncomfortable appearing, AAOx3 HEENT: No JVD CVS: S1-S2, regular rate rhythm Lungs: Improving air movement, but basilar rhonchi, no expiratory wheezes Abdomen: Soft, nontender, bowel sounds present Extremities: No edema Psych: Appropriate mood and affect   Data Reviewed: I have personally reviewed following labs and imaging studies  CBC: Recent Labs  Lab 11/25/19 0349 11/26/19 0641 11/27/19 0903 11/28/19 0814 11/29/19 0359  WBC 14.9* 15.3* 17.4* 18.4* 18.1*  HGB 12.6 11.6* 12.9 12.1 11.9*  HCT 36.4 32.9* 36.0 35.1* 34.2*  MCV 79.0* 79.9* 78.9* 80.0 80.1  PLT 390 412* 461* 376 654*   Basic Metabolic Panel: Recent Labs  Lab 11/24/19 0535 11/25/19 0349 11/26/19 0641 11/27/19 0903 11/29/19 0359  NA 136 133* 137 136 135  K 4.1 3.6 3.6 4.1 4.0  CL 101 101 105 103 105  CO2 19* 22 22 24 22   GLUCOSE 240* 339* 199* 174* 156*  BUN 28* 33* 29* 28* 26*  CREATININE 2.13* 1.84* 1.61* 1.48* 1.26*  CALCIUM 9.6 8.7* 8.7* 9.0 8.6*   GFR: Estimated Creatinine Clearance: 69.2 mL/min (A) (by C-G formula based on SCr of 1.26 mg/dL (H)). Liver Function Tests: Recent Labs  Lab 11/24/19 0535  AST 24  ALT 12  ALKPHOS 63  BILITOT 1.4*  PROT 8.6*  ALBUMIN 3.1*   Recent Labs  Lab 11/23/19 1852  LIPASE 19   No results for input(s): AMMONIA in the last 168 hours. Coagulation Profile: Recent Labs  Lab 11/24/19 0535  INR 1.1   Cardiac Enzymes: Recent Labs  Lab 11/24/19 1126 11/26/19 0641  CKTOTAL 477* 195   BNP (last 3 results) No results for input(s): PROBNP in the last 8760 hours. HbA1C: No results for input(s): HGBA1C in the last 72 hours. CBG: Recent Labs  Lab 11/28/19 0654 11/28/19 1113 11/28/19 1620 11/28/19 2034 11/29/19 0640  GLUCAP 371* 137* 78 206* 210*   Lipid Profile: No results for input(s): CHOL, HDL, LDLCALC, TRIG, CHOLHDL, LDLDIRECT in the last 72 hours. Thyroid Function Tests: No results for input(s): TSH,  T4TOTAL, FREET4, T3FREE, THYROIDAB in the last 72 hours. Anemia Panel: No results for input(s): VITAMINB12, FOLATE, FERRITIN, TIBC, IRON, RETICCTPCT in the last 72 hours. Sepsis Labs: Recent Labs  Lab 11/24/19 0512 11/24/19 0739 11/26/19 0734  PROCALCITON  --   --  <0.10  LATICACIDVEN 1.9 1.6  --     Recent Results (from the past 240 hour(s))  Blood Culture (routine x 2)     Status: None   Collection Time: 11/24/19  5:35 AM   Specimen: BLOOD  Result Value Ref Range Status   Specimen Description BLOOD SITE NOT SPECIFIED  Final   Special Requests   Final    BOTTLES DRAWN AEROBIC AND ANAEROBIC Blood Culture adequate volume   Culture   Final    NO GROWTH 5 DAYS Performed at [facility], [address], [place], [place] [address]    Report Status 11/29/2019 FINAL  Final  SARS Coronavirus 2 by RT PCR (hospital order, performed in [facility] hospital lab) Nasopharyngeal Nasopharyngeal Swab     Status: None   Collection Time: 11/24/19  5:43 AM   Specimen: Nasopharyngeal Swab  Result Value Ref Range Status   SARS Coronavirus 2 NEGATIVE NEGATIVE Final    Comment: (NOTE) SARS-CoV-2 target nucleic acids are NOT DETECTED.  The SARS-CoV-2 RNA is generally detectable in upper and lower respiratory specimens during the acute phase of infection. The lowest concentration of SARS-CoV-2 viral copies this assay can detect is 250 copies / mL. A negative result does not preclude SARS-CoV-2 infection and should not be used as the sole basis for treatment or other patient management decisions.  A negative result may occur with improper specimen collection / handling, submission of specimen other than nasopharyngeal swab, presence of viral mutation(s) within the areas targeted by this assay, and inadequate number of viral copies (<250 copies / mL). A negative result must be combined with clinical observations, patient history, and epidemiological information.  Fact Sheet for Patients:     StrictlyIdeas.no  Fact Sheet for Healthcare Providers: BankingDealers.co.za  This test is not yet approved or  cleared by the [place] FDA and has been authorized for detection and/or diagnosis of SARS-CoV-2 by FDA under an Emergency Use Authorization (EUA).  This EUA will remain in effect (meaning this test can be used) for the duration of the COVID-19 declaration under Section 564(b)(1) of the Act, 21 U.S.C. section 360bbb-3(b)(1), unless the authorization is terminated or revoked sooner.  Performed at [facility], [place] [address], [place], [place] [address]   Blood Culture (routine x 2)     Status: None   Collection Time: 11/24/19  7:39 AM   Specimen: BLOOD  Result Value Ref Range Status   Specimen Description BLOOD RIGHT ANTECUBITAL  Final   Special Requests   Final    BOTTLES DRAWN AEROBIC ONLY Blood Culture results may not be optimal due to an inadequate volume of blood received in culture bottles   Culture   Final    NO GROWTH 5 DAYS Performed at [facility], [place] [address] [place], [place] [address]    Report Status 11/29/2019 FINAL  Final  Urine culture     Status: Abnormal   Collection Time: 11/25/19  3:44 AM   Specimen: In/Out Cath Urine  Result Value Ref Range Status   Specimen Description IN/OUT CATH URINE  Final   Special Requests   Final    NONE Performed at [facility], [place] [address] [place], [place] [address]    Culture MULTIPLE SPECIES PRESENT, SUGGEST RECOLLECTION (A)  Final   Report Status 11/26/2019 FINAL  Final  Respiratory Panel by PCR     Status: Abnormal   Collection Time: 11/26/19  5:57 PM   Specimen: Nasopharyngeal Swab; Respiratory  Result Value Ref Range Status   Adenovirus NOT DETECTED NOT DETECTED Final   Coronavirus 229E NOT DETECTED NOT DETECTED Final    Comment: (NOTE) The Coronavirus on the Respiratory Panel, DOES NOT test for the novel  Coronavirus (2019  nCoV)    Coronavirus HKU1 NOT DETECTED NOT DETECTED Final   Coronavirus NL63 NOT DETECTED NOT DETECTED Final   Coronavirus OC43 NOT DETECTED NOT DETECTED Final   Metapneumovirus NOT DETECTED NOT DETECTED Final   Rhinovirus / Enterovirus NOT DETECTED NOT DETECTED Final   Influenza A NOT DETECTED NOT DETECTED Final   Influenza B NOT DETECTED NOT DETECTED Final   Parainfluenza Virus 1 NOT DETECTED NOT DETECTED Final   Parainfluenza Virus 2 NOT DETECTED NOT DETECTED Final   Parainfluenza Virus 3 NOT DETECTED NOT DETECTED Final   Parainfluenza Virus 4 NOT DETECTED  NOT DETECTED Final   Respiratory Syncytial Virus DETECTED (A) NOT DETECTED Final   Bordetella pertussis NOT DETECTED NOT DETECTED Final   Chlamydophila pneumoniae NOT DETECTED NOT DETECTED Final   Mycoplasma pneumoniae NOT DETECTED NOT DETECTED Final    Comment: Performed at [facility], [place] [address], [place], [place] [address]     Scheduled Meds: . amLODipine  10 mg Oral Daily  . benzonatate  200 mg Oral TID  . dextromethorphan-guaiFENesin  1 tablet Oral BID  . fluticasone  2 spray Each Nare Daily  . heparin injection (subcutaneous)  5,000 Units Subcutaneous Q8H  . insulin aspart  0-15 Units Subcutaneous TID WC  . insulin aspart  0-5 Units Subcutaneous QHS  . insulin aspart  8 Units Subcutaneous TID WC  . insulin glargine  45 Units Subcutaneous Daily  . pantoprazole  40 mg Oral Q1200  . polyethylene glycol  17 g Oral Daily  . pravastatin  80 mg Oral Daily  . [START ON 11/30/2019] predniSONE  40 mg Oral Q breakfast  . Ensure Max Protein  11 oz Oral TID  . senna-docusate  1 tablet Oral BID   Continuous Infusions:    LOS: 5 days    Time spent: 25 minutes.   Domenic Polite, MD [facility]   11/29/2019, 10:23 AM

## 2019-11-30 LAB — GLUCOSE, CAPILLARY
Glucose-Capillary: 244 mg/dL — ABNORMAL HIGH (ref 70–99)
Glucose-Capillary: 60 mg/dL — ABNORMAL LOW (ref 70–99)
Glucose-Capillary: 80 mg/dL (ref 70–99)

## 2019-11-30 LAB — CBC
HCT: 34.3 % — ABNORMAL LOW (ref 36.0–46.0)
Hemoglobin: 12 g/dL (ref 12.0–15.0)
MCH: 28 pg (ref 26.0–34.0)
MCHC: 35 g/dL (ref 30.0–36.0)
MCV: 80.1 fL (ref 80.0–100.0)
Platelets: 429 10*3/uL — ABNORMAL HIGH (ref 150–400)
RBC: 4.28 MIL/uL (ref 3.87–5.11)
RDW: 13.8 % (ref 11.5–15.5)
WBC: 19 10*3/uL — ABNORMAL HIGH (ref 4.0–10.5)
nRBC: 0.1 % (ref 0.0–0.2)

## 2019-11-30 MED ORDER — PREDNISONE 10 MG PO TABS: 10.0000 mg | ORAL_TABLET | Freq: Every day | ORAL | 0 refills | Status: AC

## 2019-11-30 MED ORDER — PREDNISONE 20 MG PO TABS: 30.0000 mg | ORAL_TABLET | Freq: Every day | ORAL | Status: DC

## 2019-11-30 MED ORDER — LANTUS SOLOSTAR 100 UNIT/ML ~~LOC~~ SOPN: 30.0000 [IU] | PEN_INJECTOR | Freq: Every day | SUBCUTANEOUS | Status: DC

## 2019-11-30 MED ORDER — DM-GUAIFENESIN ER 30-600 MG PO TB12: 1.0000 | ORAL_TABLET | Freq: Two times a day (BID) | ORAL | 0 refills | Status: AC

## 2019-11-30 MED ORDER — ALBUTEROL SULFATE HFA 108 (90 BASE) MCG/ACT IN AERS: 2.0000 | INHALATION_SPRAY | RESPIRATORY_TRACT | 1 refills | Status: AC | PRN

## 2019-11-30 MED ORDER — BENZONATATE 200 MG PO CAPS: 200.0000 mg | ORAL_CAPSULE | Freq: Two times a day (BID) | ORAL | 0 refills | Status: AC

## 2019-11-30 NOTE — Progress Notes (Signed)
SATURATION QUALIFICATIONS: (This note is used to comply with regulatory documentation for home oxygen)  Patient Saturations on Room Air at Rest = 100%  Patient Saturations on Room Air while Ambulating = 96%  Patient Saturations while walking went as low as 96% and went back to 100% at rest.

## 2019-11-30 NOTE — Progress Notes (Signed)
DISCHARGE NOTE HOME Shirley [name] to be discharged Home per MD order. Discussed prescriptions and follow up appointments with the patient. Prescriptions given to patient; medication list explained in detail. Patient verbalized understanding.  Skin clean, dry and intact without evidence of skin break down, no evidence of skin tears noted. IV catheter discontinued intact. Site without signs and symptoms of complications. Dressing and pressure applied. Pt denies pain at the site currently. No complaints noted.  Patient free of lines, drains, and wounds.   An After Visit Summary (AVS) was printed and given to the patient. Patient escorted via wheelchair, and discharged home via private auto.  Arlyss Repress, RN

## 2019-11-30 NOTE — Discharge Summary (Signed)
Physician Discharge Summary  Shirley [name] GSU:[id number] DOB: 12-30-1975 DOA: 11/23/2019  PCP: Fredrich Romans, PA  Admit date: 11/23/2019 Discharge date: 11/30/2019  Time spent: 45 minutes  Recommendations for Outpatient Follow-up:  1. PCP in 1 week 2. Counseled regarding importance of Covid vaccine   Discharge Diagnoses:  Principal Problem:   Sepsis due to pneumonia ([place]) Viral pneumonia-RSV Asthma exacerbation Chronic pain syndrome Chronic narcotic dependence   Essential hypertension   Abdominal pain, left lower quadrant   Diabetic gastroparesis associated with type 2 diabetes mellitus (HCC)   Abdominal pain, chronic, left lower quadrant   Gastroparesis   Nausea and vomiting   Hypertensive urgency   Thrombocytosis (HCC)   Chronic pain   Discharge Condition: Stable  Diet recommendation: Diabetic, low-sodium, heart healthy  Filed Weights   11/23/19 1820 11/30/19 0536  Weight: (!) 104 kg 111.5 kg    History of present illness:  44 year old with past medical history significant for hypertension, hyperlipidemia, CAD, CVA, diabetes type 2 uncontrolled, peripheral neuropathy and gastroparesis, hepatitis B history of PE DVT, OSA and obesity presented with multiple complaints including chest pain, abdominal pain, productive cough, wheezing nausea vomiting for 3 days.    Patient in the ED was afebrile, tachycardic heart rate 125 mild tachypnea respiration rate 22 oxygen saturation on room air, white blood cell 12, BUN 28, creatinine 2.1.  Chest x-ray revealed low lung volume bilateral infiltrates. COVID-19 Negative.  Admitted with sepsis secondary to viral pneumonia, RSV positive  Hospital Course:   Sepsis secondary to viral pneumonia Asthma exacerbation Acute hypoxic respiratory failure - presented with fever, productive cough, shortness of breath, wheezing, myalgias, anorexia, nausea and vomiting -Chest x-ray noted bilateral opacities concerning for pneumonia -Covid PCR  negative, respiratory virus panel positive for RSV -Discontinued ceftriaxone, completed 5 days of azithromycin -Improving slowly, wheezing has resolved now, still having coughing spells -Continue to taper off prednisone, continue duo nebs, Mucinex and Tessalon Perles -Check ambulatory O2 sats before discharge, weaned off O2 at rest -Patient has not been vaccinated for Covid, discussed importance of this -Discharged home on prednisone taper, albuterol MDI  Abnormal D-dimer -Had a VQ scan on on admission which showed intermediate probability of PE -Clinically her symptoms are concerned consistent with pneumonia and asthma exacerbation, has positive RSV which explains her symptoms -Lower extremity Dopplers negative for DVT -Received heparin for DVT prophylaxis  AKI on CKD stage IIIa; Creatinine on admission 2.1, previous baseline 1.1--1.2 -Back to baseline, fluids discontinued  Diabetes type 2: Uncontrolled Hemoglobin A1c is 12.0 indicating long-term poor control -CBGs improving, continue Lantus and meal coverage  Uncontrolled hypertension Continue amlodipine, resumed losartan/HCTZ at discharge, kidney function improved back to baseline  Thrombocytosis leukocytosis: Acute on chronic, could be secondary to chronic Disease -Would benefit from outpatient heme referral  Polysubstance abuse: UDS positive.  Counseled  Chronic pain syndrome: On Percocet at baseline, this was resumed  Hyperlipidemia: Continue with pravastatin  Hepatitis B chronic   Discharge Exam: Vitals:   11/29/19 2114 11/30/19 0538  BP: (!) 137/95 130/85  Pulse: 85 78  Resp: 19 19  Temp: 98 F (36.7 C) (!) 97.3 F (36.3 C)  SpO2: 98% 99%    General: AAOx3 Cardiovascular: S1S2/RRR Respiratory: improved air movement  Discharge Instructions   Discharge Instructions    Diet - low sodium heart healthy   Complete by: As directed    Diet Carb Modified   Complete by: As directed    Increase  activity slowly   Complete by: As  directed      Allergies as of 11/30/2019      Reactions   Lisinopril Nausea Only, Swelling   Morphine And Related Itching, Swelling      Medication List    STOP taking these medications   losartan 100 MG tablet Commonly known as: COZAAR     TAKE these medications   albuterol 108 (90 Base) MCG/ACT inhaler Commonly known as: VENTOLIN HFA Inhale 2 puffs into the lungs every 4 (four) hours as needed for wheezing or shortness of breath. What changed: when to take this   amitriptyline 100 MG tablet Commonly known as: ELAVIL Take 100 mg by mouth daily.   amLODipine 10 MG tablet Commonly known as: NORVASC Take 10 mg by mouth daily.   benzonatate 200 MG capsule Commonly known as: TESSALON Take 1 capsule (200 mg total) by mouth 2 (two) times daily for 5 days.   cyclobenzaprine 5 MG tablet Commonly known as: FLEXERIL Take 5-10 mg by mouth 3 (three) times daily as needed for muscle spasms.   dextromethorphan-guaiFENesin 30-600 MG 12hr tablet Commonly known as: MUCINEX DM Take 1 tablet by mouth 2 (two) times daily for 5 days.   fluticasone 50 MCG/ACT nasal spray Commonly known as: FLONASE Place 2 sprays into both nostrils daily.   gabapentin 600 MG tablet Commonly known as: NEURONTIN Take 600 mg by mouth 2 (two) times daily.   glimepiride 4 MG tablet Commonly known as: AMARYL Take 4 mg by mouth daily.   HumaLOG KwikPen 100 UNIT/ML KwikPen Generic drug: insulin lispro Inject 7 Units into the skin 4 (four) times daily -  before meals and at bedtime. Hold if blood glucose is less than 110   hydrOXYzine 50 MG tablet Commonly known as: ATARAX/VISTARIL Take 25-50 mg by mouth at bedtime as needed for anxiety.   INSULIN SYRINGE .5CC/28G 28G X 1/2" 0.5 ML Misc Please provide 1 month supply   Lantus SoloStar 100 UNIT/ML Solostar Pen Generic drug: insulin glargine Inject 30 Units into the skin daily. What changed: how much to take    levocetirizine 5 MG tablet Commonly known as: Xyzal Take 1 tablet (5 mg total) by mouth every evening.   losartan-hydrochlorothiazide 100-25 MG tablet Commonly known as: HYZAAR Take 1 tablet by mouth daily.   OneTouch Delica Lancets 72C Misc Use to check blood sugar 3 times daily. Dx codes: E11.8, E11.65   OneTouch Verio Reflect w/Device Kit 1 each by Does not apply route 3 (three) times daily.   OneTouch Verio test strip Generic drug: glucose blood Use as instructed to check blood sugar 3 times daily. Dx codes: E11.8, E11.65   oxyCODONE-acetaminophen 10-325 MG tablet Commonly known as: PERCOCET Take 1 tablet by mouth 4 (four) times daily as needed for pain.   Pen Needles 3/16" 31G X 5 MM Misc 1 Device by Does not apply route 4 (four) times daily - after meals and at bedtime.   pravastatin 80 MG tablet Commonly known as: PRAVACHOL Take 80 mg by mouth daily.   predniSONE 10 MG tablet Commonly known as: DELTASONE Take 1-4 tablets (10-40 mg total) by mouth daily with breakfast for 6 days. Take 96m daily for 2days then 281mdaily for 3days then 1053maily for 3days then STOP Start taking on: December 01, 2019   ramelteon 8 MG tablet Commonly known as: ROZEREM Take 8 mg by mouth at bedtime.   sitaGLIPtin 100 MG tablet Commonly known as: Januvia Take 1 tablet (100 mg total) by mouth daily.  traZODone 50 MG tablet Commonly known as: DESYREL Take 0.5-1 tablets (25-50 mg total) by mouth at bedtime as needed for sleep.   Trulicity 4.5 VZ/5.6LO Sopn Generic drug: Dulaglutide Inject 4.5 mg into the skin once a week. What changed: Another medication with the same name was removed. Continue taking this medication, and follow the directions you see here.   Vitamin D (Ergocalciferol) 1.25 MG (50000 UNIT) Caps capsule Commonly known as: DRISDOL Take 50,000 Units by mouth once a week.      Allergies  Allergen Reactions  . Lisinopril Nausea Only and Swelling  . Morphine And  Related Itching and Swelling    Follow-up Information    Fredrich Romans, [place]. Schedule an appointment as soon as possible for a visit in 1 week(s).   Specialty: Physician Assistant Contact information: [place] [address] [phone]                The results of significant diagnostics from this hospitalization (including imaging, microbiology, ancillary and laboratory) are listed below for reference.    Significant Diagnostic Studies: DG Chest 2 View  Result Date: 11/26/2019 CLINICAL DATA:  Cough, chest pain. EXAM: CHEST - 2 VIEW COMPARISON:  November 25, 2019. FINDINGS: The heart size and mediastinal contours are within normal limits. No pneumothorax or pleural effusion is noted. Mild right basilar subsegmental atelectasis or infiltrate is noted. Left lung is unremarkable. The visualized skeletal structures are unremarkable. IMPRESSION: Mild right basilar subsegmental atelectasis or infiltrate is noted. Electronically Signed   By: Marijo Conception M.D.   On: 11/26/2019 09:57   DG Chest 2 View  Result Date: 11/25/2019 CLINICAL DATA:  Shortness of breath. Leukocytosis. History of CHF and diabetes. EXAM: CHEST - 2 VIEW COMPARISON:  11/23/2019. FINDINGS: Mediastinum hilar structures are unremarkable. Heart size normal. Low lung volumes with mild right base atelectasis/infiltrate, improved from prior exam. Mild thoracic spine scoliosis concave left. IMPRESSION: Low lung volumes. Mild right base atelectasis/infiltrate again noted. Interim improvement from prior exam. Electronically Signed   By: Marcello Moores  Register   On: 11/25/2019 08:07   DG Chest 2 View  Result Date: 11/23/2019 CLINICAL DATA:  Chest pain.  Vomiting. EXAM: CHEST - 2 VIEW COMPARISON:  Radiograph 02/15/2019 FINDINGS: Lung volumes are low. Patchy bilateral lower lung zone airspace opacities. Stable upper normal heart size. Unchanged mediastinal contours. No pleural effusion or pneumothorax. No acute osseous  abnormalities are seen. IMPRESSION: Low lung volumes with patchy bilateral lower lung zone airspace opacities, suspicious for pneumonia, particularly on the right. In the setting of vomiting, aspiration is considered. Electronically Signed   By: Keith Rake M.D.   On: 11/23/2019 19:00   NM Pulmonary Perfusion  Result Date: 11/25/2019 CLINICAL DATA:  Chest pain, cough EXAM: NUCLEAR MEDICINE PERFUSION LUNG SCAN TECHNIQUE: Perfusion images were obtained in multiple projections after intravenous injection of radiopharmaceutical. Ventilation scans intentionally deferred if perfusion scan and chest x-ray adequate for interpretation during COVID 19 epidemic. RADIOPHARMACEUTICALS:  4.1 mCi Tc-56mMAA IV COMPARISON:  Chest x-ray 11/25/2019 FINDINGS: Adequate uptake of radiopharmaceutical within the lung fields. Segmental perfusion defect within the posterior segment of the right upper lobe. Small nonsegmental defect in the left apicoposterior segment. No corresponding airspace opacity within the upper lobes on chest radiograph. IMPRESSION: Intermediate probability for pulmonary embolism. Consider further evaluation with lower extremity venous ultrasound and/or CT angiogram of the chest. These results will be called to the ordering clinician or representative by the Radiologist Assistant, and communication documented in the  PACS or [employer]. Electronically Signed   By: Davina Poke D.O.   On: 11/25/2019 13:14   VAS [place] LOWER EXTREMITY VENOUS (DVT)  Result Date: 11/26/2019  Lower Venous DVTStudy Indications: Elevated d-dimer, edema.  Risk Factors: Suspected PE History of DVT/PE. Comparison Study: Prior study 01-14-2017 LEV Bilateral- partial RT Pop and Pero Performing Technologist: Darlin Coco  Examination Guidelines: A complete evaluation includes B-mode imaging, spectral Doppler, color Doppler, and power Doppler as needed of all accessible portions of each vessel. Bilateral testing is considered an  integral part of a complete examination. Limited examinations for reoccurring indications may be performed as noted. The reflux portion of the exam is performed with the patient in reverse Trendelenburg.  +---------+---------------+---------+-----------+----------+------------------+ RIGHT    CompressibilityPhasicitySpontaneityPropertiesThrombus Aging     +---------+---------------+---------+-----------+----------+------------------+ CFV      Full           Yes      Yes                                     +---------+---------------+---------+-----------+----------+------------------+ SFJ      Full                                                            +---------+---------------+---------+-----------+----------+------------------+ FV Prox  Full                                                            +---------+---------------+---------+-----------+----------+------------------+ FV Mid   Full                                                            +---------+---------------+---------+-----------+----------+------------------+ FV DistalFull                                                            +---------+---------------+---------+-----------+----------+------------------+ PFV      Full                                                            +---------+---------------+---------+-----------+----------+------------------+ POP      Full           Yes      Yes                  Fibrin stranding  noted              +---------+---------------+---------+-----------+----------+------------------+ PTV      Full                                                            +---------+---------------+---------+-----------+----------+------------------+ PERO     Full                                                             +---------+---------------+---------+-----------+----------+------------------+   +---------+---------------+---------+-----------+----------+--------------+ LEFT     CompressibilityPhasicitySpontaneityPropertiesThrombus Aging +---------+---------------+---------+-----------+----------+--------------+ CFV      Full           Yes      Yes                                 +---------+---------------+---------+-----------+----------+--------------+ SFJ      Full                                                        +---------+---------------+---------+-----------+----------+--------------+ FV Prox  Full                                                        +---------+---------------+---------+-----------+----------+--------------+ FV Mid   Full                                                        +---------+---------------+---------+-----------+----------+--------------+ FV DistalFull                                                        +---------+---------------+---------+-----------+----------+--------------+ PFV      Full                                                        +---------+---------------+---------+-----------+----------+--------------+ POP      Full           Yes      Yes                                 +---------+---------------+---------+-----------+----------+--------------+ PTV      Full                                                        +---------+---------------+---------+-----------+----------+--------------+  PERO     Full                                                        +---------+---------------+---------+-----------+----------+--------------+     Summary: RIGHT: - There is no evidence of deep vein thrombosis in the lower extremity.  - No cystic structure found in the popliteal fossa.  LEFT: - There is no evidence of deep vein thrombosis in the lower extremity.  - No cystic structure found in the popliteal  fossa.  *See table(s) above for measurements and observations. Electronically signed by Harold Barban MD on 11/26/2019 at 4:54:02 PM.    Final     Microbiology: Recent Results (from the past 240 hour(s))  Blood Culture (routine x 2)     Status: None   Collection Time: 11/24/19  5:35 AM   Specimen: BLOOD  Result Value Ref Range Status   Specimen Description BLOOD SITE NOT SPECIFIED  Final   Special Requests   Final    BOTTLES DRAWN AEROBIC AND ANAEROBIC Blood Culture adequate volume   Culture   Final    NO GROWTH 5 DAYS Performed at [facility], [address] [place], [place] [address]    Report Status 11/29/2019 FINAL  Final  SARS Coronavirus 2 by RT PCR (hospital order, performed in [facility] hospital lab) Nasopharyngeal Nasopharyngeal Swab     Status: None   Collection Time: 11/24/19  5:43 AM   Specimen: Nasopharyngeal Swab  Result Value Ref Range Status   SARS Coronavirus 2 NEGATIVE NEGATIVE Final    Comment: (NOTE) SARS-CoV-2 target nucleic acids are NOT DETECTED.  The SARS-CoV-2 RNA is generally detectable in upper and lower respiratory specimens during the acute phase of infection. The lowest concentration of SARS-CoV-2 viral copies this assay can detect is 250 copies / mL. A negative result does not preclude SARS-CoV-2 infection and should not be used as the sole basis for treatment or other patient management decisions.  A negative result may occur with improper specimen collection / handling, submission of specimen other than nasopharyngeal swab, presence of viral mutation(s) within the areas targeted by this assay, and inadequate number of viral copies (<250 copies / mL). A negative result must be combined with clinical observations, patient history, and epidemiological information.  Fact Sheet for Patients:   StrictlyIdeas.no  Fact Sheet for Healthcare Providers: BankingDealers.co.za  This test is not yet  approved or  cleared by the [place] FDA and has been authorized for detection and/or diagnosis of SARS-CoV-2 by FDA under an Emergency Use Authorization (EUA).  This EUA will remain in effect (meaning this test can be used) for the duration of the COVID-19 declaration under Section 564(b)(1) of the Act, 21 U.S.C. section 360bbb-3(b)(1), unless the authorization is terminated or revoked sooner.  Performed at [facility], [place] [address] [place], [place] [address]   Blood Culture (routine x 2)     Status: None   Collection Time: 11/24/19  7:39 AM   Specimen: BLOOD  Result Value Ref Range Status   Specimen Description BLOOD RIGHT ANTECUBITAL  Final   Special Requests   Final    BOTTLES DRAWN AEROBIC ONLY Blood Culture results may not be optimal due to an inadequate volume of blood received in culture bottles   Culture  Final    NO GROWTH 5 DAYS Performed at [facility], [place] [address] [place], [place] [address]    Report Status 11/29/2019 FINAL  Final  Urine culture     Status: Abnormal   Collection Time: 11/25/19  3:44 AM   Specimen: In/Out Cath Urine  Result Value Ref Range Status   Specimen Description IN/OUT CATH URINE  Final   Special Requests   Final    NONE Performed at [facility], [place] [address] [place], [place] [address]    Culture MULTIPLE SPECIES PRESENT, SUGGEST RECOLLECTION (A)  Final   Report Status 11/26/2019 FINAL  Final  Respiratory Panel by PCR     Status: Abnormal   Collection Time: 11/26/19  5:57 PM   Specimen: Nasopharyngeal Swab; Respiratory  Result Value Ref Range Status   Adenovirus NOT DETECTED NOT DETECTED Final   Coronavirus 229E NOT DETECTED NOT DETECTED Final    Comment: (NOTE) The Coronavirus on the Respiratory Panel, DOES NOT test for the novel  Coronavirus (2019 nCoV)    Coronavirus HKU1 NOT DETECTED NOT DETECTED Final   Coronavirus NL63 NOT DETECTED NOT DETECTED Final   Coronavirus OC43 NOT DETECTED NOT  DETECTED Final   Metapneumovirus NOT DETECTED NOT DETECTED Final   Rhinovirus / Enterovirus NOT DETECTED NOT DETECTED Final   Influenza A NOT DETECTED NOT DETECTED Final   Influenza B NOT DETECTED NOT DETECTED Final   Parainfluenza Virus 1 NOT DETECTED NOT DETECTED Final   Parainfluenza Virus 2 NOT DETECTED NOT DETECTED Final   Parainfluenza Virus 3 NOT DETECTED NOT DETECTED Final   Parainfluenza Virus 4 NOT DETECTED NOT DETECTED Final   Respiratory Syncytial Virus DETECTED (A) NOT DETECTED Final   Bordetella pertussis NOT DETECTED NOT DETECTED Final   Chlamydophila pneumoniae NOT DETECTED NOT DETECTED Final   Mycoplasma pneumoniae NOT DETECTED NOT DETECTED Final    Comment: Performed at [facility], [place] [address], [place], [place] [address]     Labs: Basic Metabolic Panel: Recent Labs  Lab 11/24/19 0535 11/25/19 0349 11/26/19 0641 11/27/19 0903 11/29/19 0359  NA 136 133* 137 136 135  K 4.1 3.6 3.6 4.1 4.0  CL 101 101 105 103 105  CO2 19* _0 GLUCOSE 240* 339* 199* 174* 156*  BUN 28* 33* 29* 28* 26*  CREATININE 2.13* 1.84* 1.61* 1.48* 1.26*  CALCIUM 9.6 8.7* 8.7* 9.0 8.6*   Liver Function Tests: Recent Labs  Lab 11/24/19 0535  AST 24  ALT 12  ALKPHOS 63  BILITOT 1.4*  PROT 8.6*  ALBUMIN 3.1*   Recent Labs  Lab 11/23/19 1852  LIPASE 19   No results for input(s): AMMONIA in the last 168 hours. CBC: Recent Labs  Lab 11/26/19 0641 11/27/19 0903 11/28/19 0814 11/29/19 0359 11/30/19 0236  WBC 15.3* 17.4* 18.4* 18.1* 19.0*  HGB 11.6* 12.9 12.1 11.9* 12.0  HCT 32.9* 36.0 35.1* 34.2* 34.3*  MCV 79.9* 78.9* 80.0 80.1 80.1  PLT 412* 461* 376 443* 429*   Cardiac Enzymes: Recent Labs  Lab 11/24/19 1126 11/26/19 0641  CKTOTAL 477* 195   BNP: BNP (last 3 results) No results for input(s): BNP in the last 8760 hours.  ProBNP (last 3 results) No results for input(s): PROBNP in the last 8760 hours.  CBG: Recent Labs  Lab 11/29/19 1618  11/29/19 2113 11/30/19 0719 11/30/19 1147 11/30/19 1229  GLUCAP 284* 296* 244* 60* 80   Signed:  Domenic Polite MD.  [facility] 11/30/2019, 2:17 PM

## 2019-12-21 ENCOUNTER — Encounter (HOSPITAL_COMMUNITY): Payer: Self-pay | Admitting: Emergency Medicine

## 2019-12-21 ENCOUNTER — Ambulatory Visit (INDEPENDENT_AMBULATORY_CARE_PROVIDER_SITE_OTHER): Payer: Medicare Other

## 2019-12-21 ENCOUNTER — Ambulatory Visit (HOSPITAL_COMMUNITY)
Admission: EM | Admit: 2019-12-21 | Discharge: 2019-12-21 | Disposition: A | Discharge status: Home / Self Care | Payer: Medicare Other | Attending: Physician Assistant | Admitting: Physician Assistant

## 2019-12-21 ENCOUNTER — Other Ambulatory Visit: Payer: Self-pay

## 2019-12-21 DIAGNOSIS — S92514A Nondisplaced fracture of proximal phalanx of right lesser toe(s), initial encounter for closed fracture: Secondary | ICD-10-CM | POA: Diagnosis not present

## 2019-12-21 DIAGNOSIS — M79674 Pain in right toe(s): Secondary | ICD-10-CM

## 2019-12-21 NOTE — Discharge Instructions (Signed)
There is a small break in the toe  Buddy tape your toes and wear the shoe provided  Tylenol for pain  Follow up with either your primary care or the sports medicine group in about 1 week

## 2019-12-21 NOTE — ED Triage Notes (Signed)
Pt states 4 days ago she stubbed her toe on her bed, and has experienced pain worsening since the injury. The injured toe is the 4th toe, and has minor swelling. Pt states pain relieved by heat (Sitting in a hot tub).

## 2019-12-21 NOTE — ED Provider Notes (Signed)
[place]    CSN: [id number] Arrival date & time: 12/21/19  1553      History   Chief Complaint Chief Complaint  Patient presents with  . Foot Pain    HPI Shirley [name] is a 44 y.o. female.   Patient presents for right fourth toe pain.  She reports landing foot and toe into her bed 3 nights ago.  She reports since then there is been continued pain in the fourth toe.  Throbbing pain described.  Some mild swelling.  Has not improved.  Got seemingly worse after wearing tight toed shoes.  Reports feeling otherwise well with no other pain.     Past Medical History:  Diagnosis Date  . Abscess of tunica vaginalis    10/09- Abundant S. aureus- sensitive to all abx  . Anxiety   . Blood dyscrasia   . CAD (coronary artery disease) 06/15/2006   s/p Subendocardial MI with PDA angioplasty(no stent) on 06/15/06 and relook  cath 06/19/06 showed patency of site. Cath 12/10- no restenosis or significant CAD progression  . CVA (cerebral vascular accident) ([place]) ~ 02/2014   denies residual on 04/22/2014  . CVA (cerebral vascular accident) [facility])    history of remote right cerebellar infarct noted on head CT at least since 10/2011  . Depression   . Diabetes mellitus type 2, uncontrolled, with complications ([place])   . Fibromyalgia   . Gastritis   . Gastroparesis    secondary to poorly controlled DM, last emptying study performed 01/2010  was normal but may be falsely positive as pt was on reglan  . GERD (gastroesophageal reflux disease)   . Hepatitis B, chronic (HCC)    Hep BeAb+,Hep B cAb+ & Hep BsAg+ (9/06)  . History of pyelonephritis    H/o GrpB Pyelonephritis (9/06) and UTI- 07/11- E.Coli, 12/10- GBS  . Hyperlipidemia   . Hypertension   . Iron deficiency anemia   . Irregular menses    Small ovarian follicles seen on OY(7/74)  . MI (myocardial infarction) ([place]) 05/2006   PDA percutaneous transluminal coronary angioplasty  . Migraine    "weekly" (04/22/2014)  . N&V  (nausea and vomiting)    Chronic. Unclear etiology with multiple admission and ED visits. CT abdomen with and without contrast (02/2011)  showed no acute process. Gastic Emptying scan (01/2010) was normal. Ultrasound of the abdomen was within normal limits. Hepatitis B viral load was undectable. HIV NR. EGD - gastritis, Hpylori + s/p Rx  . Obesity   . OSA (obstructive sleep apnea)    "suppose to wear mask but I don't" (04/22/2014)  . Peripheral neuropathy   . Pneumonia    "this is probably the 2nd or 3rd time I've had pneumonia" (04/22/2014)  . Recurrent boils   . Seasonal asthma   . Substance abuse ([place])   . Thrombocytosis ([place])    Hem/Onc suggested 2/2 chronic hepatits and/or iron deficiency anemia    Patient Active Problem List   Diagnosis Date Noted  . Sepsis due to pneumonia ([place]) 11/24/2019  . Thrombocytosis ([place]) 11/24/2019  . Chronic pain 11/24/2019  . History of pulmonary embolism 10/10/2017  . Chronic anticoagulation 10/10/2017  . GERD (gastroesophageal reflux disease) 10/10/2017  . Leukocytosis 10/10/2017  . Prolonged QT interval 10/10/2017  . Nausea & vomiting 09/20/2017  . Hypertensive urgency 09/20/2017  . Intractable nausea and vomiting 08/08/2017  . Chronic maxillary sinusitis 01/02/2017  . Acute blood loss anemia 11/13/2016  . Menorrhagia 11/13/2016  . Bilateral pulmonary  embolism (HCC) 10/30/2016  . Acute DVT (deep venous thrombosis) (HCC) 10/30/2016  . Nausea and vomiting 09/23/2016  . Type 2 diabetes mellitus with hyperglycemia (HCC) 04/07/2016  . Diabetic gastroparesis (HCC) 04/06/2016  . Dehydration 03/27/2015  . Gastroparesis 11/11/2014  . Hematemesis 10/13/2014  . DKA (diabetic ketoacidoses) (HCC) 10/13/2014  . Abdominal pain, chronic, left lower quadrant   . Diabetic gastroparesis associated with type 2 diabetes mellitus (HCC) 04/28/2014  . History of Helicobacter pylori infection 04/22/2014  . Vaginal discharge 02/18/2014  . Unspecified  constipation 07/21/2013  . Tinea corporis 07/21/2013  . Intractable vomiting 04/29/2013  . Dysmenorrhea 04/22/2013  . UTI (urinary tract infection) 07/15/2012  . Headache(784.0) 02/08/2012  . Health care maintenance 01/22/2012  . Chronic hepatitis B (HCC) 03/07/2011  . History of leukocytosis 04/06/2010  . THROMBOCYTOSIS 04/06/2010  . Polysubstance abuse (HCC) 02/23/2010  . Iron deficiency anemia 11/22/2009  . PERIPHERAL NEUROPATHY 10/01/2009  . Hyperlipidemia 08/30/2009  . Diabetic polyneuropathy (HCC) 08/30/2009  . Hidradenitis (recurrent boils) 07/07/2008  . Depression 12/27/2007  . Abdominal pain, left lower quadrant 11/21/2007  . FIBROMYALGIA 10/30/2007  . BACK PAIN 04/01/2007  . OBSTRUCTIVE SLEEP APNEA 01/17/2007  . ANXIETY DEPRESSION 06/27/2006  . Chronic ischemic heart disease 06/15/2006  . OBESITY, MORBID 05/15/2006  . MIGRAINE HEADACHE 05/15/2006  . Asthma 05/15/2006  . Essential hypertension 01/16/2006  . IRREGULAR MENSTRUATION 01/16/2006  . PEDAL EDEMA 01/16/2006  . Poorly controlled type 2 diabetes mellitus with peripheral neuropathy (HCC) 01/16/1989    Past Surgical History:  Procedure Laterality Date  . CESAREAN SECTION  1997  . CORONARY ANGIOPLASTY WITH STENT PLACEMENT  2008   "2 stents"  . ESOPHAGOGASTRODUODENOSCOPY N/A 04/23/2014   Procedure: ESOPHAGOGASTRODUODENOSCOPY (EGD);  Surgeon: Vertell Novak., MD;  Location: [facility] ENDOSCOPY;  Service: Endoscopy;  Laterality: N/A;  . IR ANGIOGRAM PELVIS SELECTIVE OR SUPRASELECTIVE  12/07/2016  . IR ANGIOGRAM PELVIS SELECTIVE OR SUPRASELECTIVE  12/07/2016  . IR ANGIOGRAM SELECTIVE EACH ADDITIONAL VESSEL  12/07/2016  . IR ANGIOGRAM SELECTIVE EACH ADDITIONAL VESSEL  12/07/2016  . IR EMBO ARTERIAL NOT HEMORR HEMANG INC GUIDE ROADMAPPING  12/07/2016  . IR RADIOLOGIST EVAL & MGMT  01/09/2017  . IR US GUIDE VASC ACCESS RIGHT  12/07/2016    OB History    Gravida  2   Para  2   Term  2   Preterm      AB       Living        SAB      TAB      Ectopic      Multiple      Live Births  2            Home Medications    Prior to Admission medications   Medication Sig Start Date End Date Taking? Authorizing Provider  albuterol (VENTOLIN HFA) 108 (90 Base) MCG/ACT inhaler Inhale 2 puffs into the lungs every 4 (four) hours as needed for wheezing or shortness of breath. 11/30/19   Zannie Cove, MD  amitriptyline (ELAVIL) 100 MG tablet Take 100 mg by mouth daily.  09/03/19   [provider]  amLODipine (NORVASC) 10 MG tablet Take 10 mg by mouth daily. 10/02/19   [provider]  Blood Glucose Monitoring Suppl (ONETOUCH VERIO REFLECT) w/Device KIT 1 each by Does not apply route 3 (three) times daily. 12/04/18   Grayce Sessions, NP  cyclobenzaprine (FLEXERIL) 5 MG tablet Take 5-10 mg by mouth 3 (three) times daily as  needed for muscle spasms. 11/05/19   [provider]  fluticasone (FLONASE) 50 MCG/ACT nasal spray Place 2 sprays into both nostrils daily. 11/11/19   [provider]  gabapentin (NEURONTIN) 600 MG tablet Take 600 mg by mouth 2 (two) times daily. 11/05/19   [provider]  glimepiride (AMARYL) 4 MG tablet Take 4 mg by mouth daily. 11/05/19   [provider]  glucose blood (ONETOUCH VERIO) test strip Use as instructed to check blood sugar 3 times daily. Dx codes: E11.8, E11.65 12/07/18   Charlott Rakes, MD  HUMALOG KWIKPEN 100 UNIT/ML KwikPen Inject 7 Units into the skin 4 (four) times daily -  before meals and at bedtime. Hold if blood glucose is less than 110 11/11/19   [provider]  hydrOXYzine (ATARAX/VISTARIL) 50 MG tablet Take 25-50 mg by mouth at bedtime as needed for anxiety.  09/03/19   [provider]  insulin glargine (LANTUS SOLOSTAR) 100 UNIT/ML Solostar Pen Inject 30 Units into the skin daily. 11/30/19   Domenic Polite, MD  Insulin Pen Needle (PEN NEEDLES 3/16") 31G X 5 MM MISC 1 Device by Does not apply  route 4 (four) times daily - after meals and at bedtime. 11/28/18   Kerin Perna, NP  INSULIN SYRINGE .5CC/28G (INS SYRINGE/NEEDLE .5CC/28G) 28G X 1/2" 0.5 ML MISC Please provide 1 month supply 09/04/17   Colbert Ewing, MD  levocetirizine (XYZAL) 5 MG tablet Take 1 tablet (5 mg total) by mouth every evening. 11/28/18   Kerin Perna, NP  losartan-hydrochlorothiazide (HYZAAR) 100-25 MG tablet Take 1 tablet by mouth daily. 11/28/18   Kerin Perna, NP  OneTouch Delica Lancets 38L MISC Use to check blood sugar 3 times daily. Dx codes: E11.8, E11.65 12-07-18   Charlott Rakes, MD  oxyCODONE-acetaminophen (PERCOCET) 10-325 MG tablet Take 1 tablet by mouth 4 (four) times daily as needed for pain. 11/05/19   [provider]  pravastatin (PRAVACHOL) 80 MG tablet Take 80 mg by mouth daily. 11/05/19   [provider]  ramelteon (ROZEREM) 8 MG tablet Take 8 mg by mouth at bedtime. 06/19/19   [provider]  sitaGLIPtin (JANUVIA) 100 MG tablet Take 1 tablet (100 mg total) by mouth daily. 11/28/18   Kerin Perna, NP  traZODone (DESYREL) 50 MG tablet Take 0.5-1 tablets (25-50 mg total) by mouth at bedtime as needed for sleep. 11/28/18   Kerin Perna, NP  TRULICITY 4.5 HT/3.4KA SOPN Inject 4.5 mg into the skin once a week. 11/11/19   [provider]  Vitamin D, Ergocalciferol, (DRISDOL) 1.25 MG (50000 UNIT) CAPS capsule Take 50,000 Units by mouth once a week. 11/11/19   [provider]    Family History Family History  Problem Relation Age of Onset  . Diabetes Father   . Healthy Mother     Social History Social History   Tobacco Use  . Smoking status: Former Smoker    Types: Cigarettes    Quit date: 04/24/1996    Years since quitting: 23.6  . Smokeless tobacco: Never Used  . Tobacco comment: quit smoking cigarettes age 40  Vaping Use  . Vaping Use: Never used  Substance Use Topics  . Alcohol use: No    Alcohol/week: 0.0 standard drinks     Comment: 04/22/2014 "might have a few drinks a month"  . Drug use: Not Currently    Types: Marijuana, Cocaine    Comment: 04/22/2104 "quit drugs ~ 1-2 yr ago"  Allergies   Lisinopril and Morphine and related   Review of Systems Review of Systems   Physical Exam Triage Vital Signs ED Triage Vitals  Enc Vitals Group     BP 12/21/19 1631 (!) 197/98     Pulse Rate 12/21/19 1631 (!) 116     Resp --      Temp 12/21/19 1631 99.1 F (37.3 C)     Temp Source 12/21/19 1631 Oral     SpO2 12/21/19 1631 99 %     Weight --      Height --      Head Circumference --      Peak Flow --      Pain Score 12/21/19 1632 9     Pain Loc --      Pain Edu? --      Excl. in [place]? --    No data found.  Updated Vital Signs BP (!) 197/98 (BP Location: Right Arm)   Pulse (!) 116   Temp 99.1 F (37.3 C) (Oral)   SpO2 99%   Visual Acuity Right Eye Distance:   Left Eye Distance:   Bilateral Distance:    Right Eye Near:   Left Eye Near:    Bilateral Near:     Physical Exam Vitals and nursing note reviewed.  Musculoskeletal:     Comments: Right foot with mild dorsal swelling over the fourth and fifth metatarsal heads.  Tenderness palpation of the fourth digit.  No other tenderness in the foot or digits.  Able to wiggle toes.  Cap refill less than 2 seconds sensation intact.      UC Treatments / Results  Labs (all labs ordered are listed, but only abnormal results are displayed) Labs Reviewed - No data to display  EKG   Radiology DG Foot Complete Right  Result Date: 12/21/2019 CLINICAL DATA:  Trauma 3 days ago.  Worsening fourth toe pain. EXAM: RIGHT FOOT COMPLETE - 3+ VIEW COMPARISON:  None. FINDINGS: Subtle fracture through the base of the proximal fourth phalanx. No other abnormalities. IMPRESSION: Subtle fracture through the base of the fourth proximal phalanx. Electronically Signed   By: Dorise Bullion III M.D   On: 12/21/2019 17:04    Procedures Procedures (including  critical care time)  Medications Ordered in UC Medications - No data to display  Initial Impression / Assessment and Plan / UC Course  I have reviewed the triage vital signs and the nursing notes.  Pertinent labs & imaging results that were available during my care of the patient were reviewed by me and considered in my medical decision making (see chart for details).     #Closed nondisplaced fracture of the proximal phalanx of the fourth toe on the right foot Patient 44 year old presenting with closed nondisplaced fracture of the proximal phalanx of the fourth toe on the right foot.  Buddy taped and placed in postop shoe.  Will have follow-up with primary care, also gave the option of sports medicine follow-up.  Recommend Tylenol for pain.  Patient verbalized understand plan of care Final Clinical Impressions(s) / UC Diagnoses   Final diagnoses:  Closed nondisplaced fracture of proximal phalanx of lesser toe of right foot, initial encounter     Discharge Instructions     There is a small break in the toe  Buddy tape your toes and wear the shoe provided  Tylenol for pain  Follow up with either your primary care or the sports medicine group in about 1 week  ED Prescriptions    None     PDMP not reviewed this encounter.   Purnell Shoemaker, PA-C 12/21/19 1724

## 2020-01-03 ENCOUNTER — Encounter (HOSPITAL_COMMUNITY): Payer: Self-pay

## 2020-01-03 ENCOUNTER — Emergency Department (HOSPITAL_COMMUNITY)
Admission: EM | Admit: 2020-01-03 | Discharge: 2020-01-04 | Disposition: A | Discharge status: Home / Self Care | Payer: Medicare Other | Attending: Emergency Medicine | Admitting: Emergency Medicine

## 2020-01-03 ENCOUNTER — Other Ambulatory Visit: Payer: Self-pay

## 2020-01-03 DIAGNOSIS — R1110 Vomiting, unspecified: Secondary | ICD-10-CM | POA: Diagnosis present

## 2020-01-03 DIAGNOSIS — Z87891 Personal history of nicotine dependence: Secondary | ICD-10-CM | POA: Insufficient documentation

## 2020-01-03 DIAGNOSIS — K3184 Gastroparesis: Secondary | ICD-10-CM | POA: Diagnosis not present

## 2020-01-03 DIAGNOSIS — I2510 Atherosclerotic heart disease of native coronary artery without angina pectoris: Secondary | ICD-10-CM | POA: Insufficient documentation

## 2020-01-03 DIAGNOSIS — E1165 Type 2 diabetes mellitus with hyperglycemia: Secondary | ICD-10-CM | POA: Diagnosis not present

## 2020-01-03 DIAGNOSIS — R739 Hyperglycemia, unspecified: Secondary | ICD-10-CM

## 2020-01-03 DIAGNOSIS — R000 Tachycardia, unspecified: Secondary | ICD-10-CM | POA: Insufficient documentation

## 2020-01-03 DIAGNOSIS — Z794 Long term (current) use of insulin: Secondary | ICD-10-CM | POA: Insufficient documentation

## 2020-01-03 DIAGNOSIS — Z79899 Other long term (current) drug therapy: Secondary | ICD-10-CM | POA: Insufficient documentation

## 2020-01-03 DIAGNOSIS — I10 Essential (primary) hypertension: Secondary | ICD-10-CM | POA: Diagnosis not present

## 2020-01-03 LAB — URINALYSIS, ROUTINE W REFLEX MICROSCOPIC
Glucose, UA: >=500 mg/dL — AB
Ketones, ur: 15 mg/dL — AB
Leukocytes,Ua: NEGATIVE
Nitrite: NEGATIVE
Protein, ur: >300 mg/dL — AB
Specific Gravity, Urine: 1.025 (ref 1.005–1.030)
pH: 6.5 (ref 5.0–8.0)

## 2020-01-03 LAB — I-STAT BETA HCG BLOOD, ED (MC, WL, AP ONLY): I-stat hCG, quantitative: <5 m[IU]/mL (ref <5)

## 2020-01-03 LAB — COMPREHENSIVE METABOLIC PANEL
ALT: 10 U/L (ref 0–44)
AST: 17 U/L (ref 15–41)
Albumin: 2.9 g/dL — ABNORMAL LOW (ref 3.5–5.0)
Alkaline Phosphatase: 64 U/L (ref 38–126)
Anion gap: 16 — ABNORMAL HIGH (ref 5–15)
BUN: 24 mg/dL — ABNORMAL HIGH (ref 6–20)
CO2: 22 mmol/L (ref 22–32)
Calcium: 9.5 mg/dL (ref 8.9–10.3)
Chloride: 96 mmol/L — ABNORMAL LOW (ref 98–111)
Creatinine, Ser: 1.76 mg/dL — ABNORMAL HIGH (ref 0.44–1.00)
GFR calc Af Amer: 40 mL/min — ABNORMAL LOW (ref >60)
GFR calc non Af Amer: 34 mL/min — ABNORMAL LOW (ref >60)
Glucose, Bld: 311 mg/dL — ABNORMAL HIGH (ref 70–99)
Potassium: 3.9 mmol/L (ref 3.5–5.1)
Sodium: 134 mmol/L — ABNORMAL LOW (ref 135–145)
Total Bilirubin: 1 mg/dL (ref 0.3–1.2)
Total Protein: 7.5 g/dL (ref 6.5–8.1)

## 2020-01-03 LAB — CBC
HCT: 41.5 % (ref 36.0–46.0)
Hemoglobin: 14.1 g/dL (ref 12.0–15.0)
MCH: 27.4 pg (ref 26.0–34.0)
MCHC: 34 g/dL (ref 30.0–36.0)
MCV: 80.6 fL (ref 80.0–100.0)
Platelets: 485 10*3/uL — ABNORMAL HIGH (ref 150–400)
RBC: 5.15 MIL/uL — ABNORMAL HIGH (ref 3.87–5.11)
RDW: 13.8 % (ref 11.5–15.5)
WBC: 16 10*3/uL — ABNORMAL HIGH (ref 4.0–10.5)
nRBC: 0 % (ref 0.0–0.2)

## 2020-01-03 LAB — URINALYSIS, MICROSCOPIC (REFLEX)

## 2020-01-03 LAB — LIPASE, BLOOD: Lipase: 26 U/L (ref 11–51)

## 2020-01-03 MED ORDER — ONDANSETRON 4 MG PO TBDP
4.0000 mg | ORAL_TABLET | Freq: Once | ORAL | Status: AC
  Administered 2020-01-03: 4 mg via ORAL
  Filled 2020-01-03: qty 1

## 2020-01-03 NOTE — ED Triage Notes (Signed)
Pt reports that since Wed she has been having vomiting, went to see her PCP yesterday and given phenergan without relief. Hx of gastroparesis

## 2020-01-04 DIAGNOSIS — K3184 Gastroparesis: Secondary | ICD-10-CM | POA: Diagnosis not present

## 2020-01-04 LAB — CBG MONITORING, ED
Glucose-Capillary: 390 mg/dL — ABNORMAL HIGH (ref 70–99)
Glucose-Capillary: 302 mg/dL — ABNORMAL HIGH (ref 70–99)

## 2020-01-04 MED ORDER — SODIUM CHLORIDE 0.9 % IV BOLUS
500.0000 mL | Freq: Once | INTRAVENOUS | Status: AC
  Administered 2020-01-04: 500 mL via INTRAVENOUS

## 2020-01-04 MED ORDER — ONDANSETRON 4 MG PO TBDP: 4.0000 mg | ORAL_TABLET | Freq: Three times a day (TID) | ORAL | 0 refills | Status: DC | PRN

## 2020-01-04 MED ORDER — ONDANSETRON 4 MG PO TBDP
4.0000 mg | ORAL_TABLET | Freq: Once | ORAL | Status: AC
  Administered 2020-01-04: 4 mg via ORAL
  Filled 2020-01-04: qty 1

## 2020-01-04 MED ORDER — SODIUM CHLORIDE 0.9 % IV BOLUS
1000.0000 mL | Freq: Once | INTRAVENOUS | Status: AC
  Administered 2020-01-04: 1000 mL via INTRAVENOUS

## 2020-01-04 MED ORDER — METOCLOPRAMIDE HCL 5 MG/ML IJ SOLN
5.0000 mg | Freq: Once | INTRAMUSCULAR | Status: AC
  Administered 2020-01-04: 5 mg via INTRAVENOUS
  Filled 2020-01-04: qty 2

## 2020-01-04 MED ORDER — BLOOD GLUCOSE MONITOR KIT: PACK | 0 refills | Status: DC

## 2020-01-04 NOTE — ED Notes (Signed)
Patient verbalizes understanding of discharge instructions . Opportunity for questions and answers were provided . Armband removed by staff ,Pt discharged from ED. W/C  offered at D/C  and Declined W/C at D/C and was escorted to lobby by RN.  

## 2020-01-04 NOTE — ED Provider Notes (Signed)
[facility] EMERGENCY DEPARTMENT Provider Note   CSN: [id number] Arrival date & time: 01/03/20  1831     History Chief Complaint  Patient presents with  . Emesis    Shirley [name] is a 44 y.o. female with a past medical history of IDDM, prior stroke, hypertension, hyperlipidemia, prior MI, gastroparesis, fibromyalgia presenting to the ED with a chief complaint of abdominal pain and vomiting.  She is concerned that this is a flareup of her gastroparesis.  Started having symptoms approximately 4 days ago.  Went to see her PCP during this and was prescribed Phenergan.  She did not have much relief with this.  States that even the smell of certain foods will cause her to have nausea and vomiting.  Reports pain is in her lower abdomen which is typical of her gastroparesis.  Denies any changes to bowel movements or urination.  She did have some vaginal discharge but was checked by her PCP 3 days ago "for everything including STDs."  She has not been checking her blood sugars at home as she lost her meter.  She denies any fever, cough, chest pain, shortness of breath, bloody stools, sick contacts with similar symptoms, suspicious food intake.  She did have some improvement with Zofran given in triage and was able to drink a few sips of Gatorade.  HPI     Past Medical History:  Diagnosis Date  . Abscess of tunica vaginalis    10/09- Abundant S. aureus- sensitive to all abx  . Anxiety   . Blood dyscrasia   . CAD (coronary artery disease) 06/15/2006   s/p Subendocardial MI with PDA angioplasty(no stent) on 06/15/06 and relook  cath 06/19/06 showed patency of site. Cath 12/10- no restenosis or significant CAD progression  . CVA (cerebral vascular accident) ([place]) ~ 02/2014   denies residual on 04/22/2014  . CVA (cerebral vascular accident) [facility])    history of remote right cerebellar infarct noted on head CT at least since 10/2011  . Depression   . Diabetes mellitus type 2,  uncontrolled, with complications ([place])   . Fibromyalgia   . Gastritis   . Gastroparesis    secondary to poorly controlled DM, last emptying study performed 01/2010  was normal but may be falsely positive as pt was on reglan  . GERD (gastroesophageal reflux disease)   . Hepatitis B, chronic (HCC)    Hep BeAb+,Hep B cAb+ & Hep BsAg+ (9/06)  . History of pyelonephritis    H/o GrpB Pyelonephritis (9/06) and UTI- 07/11- E.Coli, 12/10- GBS  . Hyperlipidemia   . Hypertension   . Iron deficiency anemia   . Irregular menses    Small ovarian follicles seen on HW(2/99)  . MI (myocardial infarction) ([place]) 05/2006   PDA percutaneous transluminal coronary angioplasty  . Migraine    "weekly" (04/22/2014)  . N&V (nausea and vomiting)    Chronic. Unclear etiology with multiple admission and ED visits. CT abdomen with and without contrast (02/2011)  showed no acute process. Gastic Emptying scan (01/2010) was normal. Ultrasound of the abdomen was within normal limits. Hepatitis B viral load was undectable. HIV NR. EGD - gastritis, Hpylori + s/p Rx  . Obesity   . OSA (obstructive sleep apnea)    "suppose to wear mask but I don't" (04/22/2014)  . Peripheral neuropathy   . Pneumonia    "this is probably the 2nd or 3rd time I've had pneumonia" (04/22/2014)  . Recurrent boils   . Seasonal asthma   .  Substance abuse ([place])   . Thrombocytosis ([place])    Hem/Onc suggested 2/2 chronic hepatits and/or iron deficiency anemia    Patient Active Problem List   Diagnosis Date Noted  . Sepsis due to pneumonia ([place]) 11/24/2019  . Thrombocytosis ([place]) 11/24/2019  . Chronic pain 11/24/2019  . History of pulmonary embolism 10/10/2017  . Chronic anticoagulation 10/10/2017  . GERD (gastroesophageal reflux disease) 10/10/2017  . Leukocytosis 10/10/2017  . Prolonged QT interval 10/10/2017  . Nausea & vomiting 09/20/2017  . Hypertensive urgency 09/20/2017  . Intractable nausea and vomiting 08/08/2017  . Chronic  maxillary sinusitis 01/02/2017  . Acute blood loss anemia 11/13/2016  . Menorrhagia 11/13/2016  . Bilateral pulmonary embolism ([place]) 10/30/2016  . Acute DVT (deep venous thrombosis) ([place]) 10/30/2016  . Nausea and vomiting 09/23/2016  . Type 2 diabetes mellitus with hyperglycemia ([place]) 04/07/2016  . Diabetic gastroparesis ([place]) 04/06/2016  . Dehydration 03/27/2015  . Gastroparesis 11/11/2014  . Hematemesis 10/13/2014  . DKA (diabetic ketoacidoses) ([place]) 10/13/2014  . Abdominal pain, chronic, left lower quadrant   . Diabetic gastroparesis associated with type 2 diabetes mellitus ([place]) 04/28/2014  . History of Helicobacter pylori infection 04/22/2014  . Vaginal discharge 02/18/2014  . Unspecified constipation 07/21/2013  . Tinea corporis 07/21/2013  . Intractable vomiting 04/29/2013  . Dysmenorrhea 04/22/2013  . UTI (urinary tract infection) 07/15/2012  . Headache(784.0) 02/08/2012  . Health care maintenance 01/22/2012  . Chronic hepatitis B ([place]) 03/07/2011  . History of leukocytosis 04/06/2010  . THROMBOCYTOSIS 04/06/2010  . Polysubstance abuse ([place]) 02/23/2010  . Iron deficiency anemia 11/22/2009  . PERIPHERAL NEUROPATHY 10/01/2009  . Hyperlipidemia 08/30/2009  . Diabetic polyneuropathy ([place]) 08/30/2009  . Hidradenitis (recurrent boils) 07/07/2008  . Depression 12/27/2007  . Abdominal pain, left lower quadrant 11/21/2007  . FIBROMYALGIA 10/30/2007  . BACK PAIN 04/01/2007  . OBSTRUCTIVE SLEEP APNEA 01/17/2007  . ANXIETY DEPRESSION 06/27/2006  . Chronic ischemic heart disease 06/15/2006  . OBESITY, MORBID 05/15/2006  . MIGRAINE HEADACHE 05/15/2006  . Asthma 05/15/2006  . Essential hypertension 01/16/2006  . IRREGULAR MENSTRUATION 01/16/2006  . PEDAL EDEMA 01/16/2006  . Poorly controlled type 2 diabetes mellitus with peripheral neuropathy ([place]) 01/16/1989    Past Surgical History:  Procedure Laterality Date  . CESAREAN SECTION  1997  . CORONARY ANGIOPLASTY WITH STENT  PLACEMENT  2008   "2 stents"  . ESOPHAGOGASTRODUODENOSCOPY N/A 04/23/2014   Procedure: ESOPHAGOGASTRODUODENOSCOPY (EGD);  Surgeon: Winfield Cunas., MD;  Location: [facility] ENDOSCOPY;  Service: Endoscopy;  Laterality: N/A;  . IR ANGIOGRAM PELVIS SELECTIVE OR SUPRASELECTIVE  12/07/2016  . IR ANGIOGRAM PELVIS SELECTIVE OR SUPRASELECTIVE  12/07/2016  . IR ANGIOGRAM SELECTIVE EACH ADDITIONAL VESSEL  12/07/2016  . IR ANGIOGRAM SELECTIVE EACH ADDITIONAL VESSEL  12/07/2016  . IR EMBO ARTERIAL NOT HEMORR HEMANG INC GUIDE ROADMAPPING  12/07/2016  . IR RADIOLOGIST EVAL & MGMT  01/09/2017  . IR US GUIDE VASC ACCESS RIGHT  12/07/2016     OB History    Gravida  2   Para  2   Term  2   Preterm      AB      Living        SAB      TAB      Ectopic      Multiple      Live Births  2           Family History  Problem Relation Age of Onset  . Diabetes Father   . Healthy  Mother     Social History   Tobacco Use  . Smoking status: Former Smoker    Types: Cigarettes    Quit date: 04/24/1996    Years since quitting: 23.7  . Smokeless tobacco: Never Used  . Tobacco comment: quit smoking cigarettes age 75  Vaping Use  . Vaping Use: Never used  Substance Use Topics  . Alcohol use: No    Alcohol/week: 0.0 standard drinks    Comment: 04/22/2014 "might have a few drinks a month"  . Drug use: Not Currently    Types: Marijuana, Cocaine    Comment: 04/22/2104 "quit drugs ~ 1-2 yr ago"    Home Medications Prior to Admission medications   Medication Sig Start Date End Date Taking? Authorizing Provider  albuterol (VENTOLIN HFA) 108 (90 Base) MCG/ACT inhaler Inhale 2 puffs into the lungs every 4 (four) hours as needed for wheezing or shortness of breath. 11/30/19   Domenic Polite, MD  amitriptyline (ELAVIL) 100 MG tablet Take 100 mg by mouth daily.  09/03/19   [provider]  amLODipine (NORVASC) 10 MG tablet Take 10 mg by mouth daily. 10/02/19   [provider]  blood glucose  meter kit and supplies KIT Dispense based on patient and insurance preference. Use up to four times daily as directed. (FOR ICD-9 250.00, 250.01). 01/04/20   Yobani Schertzer, PA-C  Blood Glucose Monitoring Suppl (ONETOUCH VERIO REFLECT) w/Device KIT 1 each by Does not apply route 3 (three) times daily. December 30, 2018   Kerin Perna, NP  cyclobenzaprine (FLEXERIL) 5 MG tablet Take 5-10 mg by mouth 3 (three) times daily as needed for muscle spasms. 11/05/19   [provider]  fluticasone (FLONASE) 50 MCG/ACT nasal spray Place 2 sprays into both nostrils daily. 11/11/19   [provider]  gabapentin (NEURONTIN) 600 MG tablet Take 600 mg by mouth 2 (two) times daily. 11/05/19   [provider]  glimepiride (AMARYL) 4 MG tablet Take 4 mg by mouth daily. 11/05/19   [provider]  glucose blood (ONETOUCH VERIO) test strip Use as instructed to check blood sugar 3 times daily. Dx codes: E11.8, E11.65 December 30, 2018   Charlott Rakes, MD  HUMALOG KWIKPEN 100 UNIT/ML KwikPen Inject 7 Units into the skin 4 (four) times daily -  before meals and at bedtime. Hold if blood glucose is less than 110 11/11/19   [provider]  hydrOXYzine (ATARAX/VISTARIL) 50 MG tablet Take 25-50 mg by mouth at bedtime as needed for anxiety.  09/03/19   [provider]  insulin glargine (LANTUS SOLOSTAR) 100 UNIT/ML Solostar Pen Inject 30 Units into the skin daily. 11/30/19   Domenic Polite, MD  Insulin Pen Needle (PEN NEEDLES 3/16") 31G X 5 MM MISC 1 Device by Does not apply route 4 (four) times daily - after meals and at bedtime. 11/28/18   Kerin Perna, NP  INSULIN SYRINGE .5CC/28G (INS SYRINGE/NEEDLE .5CC/28G) 28G X 1/2" 0.5 ML MISC Please provide 1 month supply 09/04/17   Colbert Ewing, MD  levocetirizine (XYZAL) 5 MG tablet Take 1 tablet (5 mg total) by mouth every evening. 11/28/18   Kerin Perna, NP  losartan-hydrochlorothiazide (HYZAAR) 100-25 MG tablet Take 1 tablet by mouth  daily. 11/28/18   Kerin Perna, NP  ondansetron (ZOFRAN ODT) 4 MG disintegrating tablet Take 1 tablet (4 mg total) by mouth every 8 (eight) hours as needed for nausea or vomiting. 01/04/20   Rebbecca Osuna, PA-C  OneTouch Delica Lancets 43P MISC Use to  check blood sugar 3 times daily. Dx codes: E11.8, E11.65 14-Dec-2018   Charlott Rakes, MD  oxyCODONE-acetaminophen (PERCOCET) 10-325 MG tablet Take 1 tablet by mouth 4 (four) times daily as needed for pain. 11/05/19   [provider]  pravastatin (PRAVACHOL) 80 MG tablet Take 80 mg by mouth daily. 11/05/19   [provider]  ramelteon (ROZEREM) 8 MG tablet Take 8 mg by mouth at bedtime. 06/19/19   [provider]  sitaGLIPtin (JANUVIA) 100 MG tablet Take 1 tablet (100 mg total) by mouth daily. 11/28/18   Kerin Perna, NP  traZODone (DESYREL) 50 MG tablet Take 0.5-1 tablets (25-50 mg total) by mouth at bedtime as needed for sleep. 11/28/18   Kerin Perna, NP  TRULICITY 4.5 XB/2.6OM SOPN Inject 4.5 mg into the skin once a week. 11/11/19   [provider]  Vitamin D, Ergocalciferol, (DRISDOL) 1.25 MG (50000 UNIT) CAPS capsule Take 50,000 Units by mouth once a week. 11/11/19   [provider]    Allergies    Lisinopril and Morphine and related  Review of Systems   Review of Systems  Constitutional: Negative for appetite change, chills and fever.  HENT: Negative for ear pain, rhinorrhea, sneezing and sore throat.   Eyes: Negative for photophobia and visual disturbance.  Respiratory: Negative for cough, chest tightness, shortness of breath and wheezing.   Cardiovascular: Negative for chest pain and palpitations.  Gastrointestinal: Positive for abdominal pain, nausea and vomiting. Negative for blood in stool, constipation and diarrhea.  Genitourinary: Negative for dysuria, hematuria and urgency.  Musculoskeletal: Negative for myalgias.  Skin: Negative for rash.  Neurological: Negative for dizziness,  weakness and light-headedness.    Physical Exam Updated Vital Signs BP (!) 139/93   Pulse 93   Temp 98.2 F (36.8 C) (Oral)   Resp (!) 25   SpO2 96%   Physical Exam Vitals and nursing note reviewed.  Constitutional:      General: She is not in acute distress.    Appearance: She is well-developed.  HENT:     Head: Normocephalic and atraumatic.     Nose: Nose normal.  Eyes:     General: No scleral icterus.       Right eye: No discharge.        Left eye: No discharge.     Conjunctiva/sclera: Conjunctivae normal.  Cardiovascular:     Rate and Rhythm: Regular rhythm. Tachycardia present.     Heart sounds: Normal heart sounds. No murmur heard.  No friction rub. No gallop.   Pulmonary:     Effort: Pulmonary effort is normal. No respiratory distress.     Breath sounds: Normal breath sounds.  Abdominal:     General: Bowel sounds are normal. There is no distension.     Palpations: Abdomen is soft.     Tenderness: There is abdominal tenderness (Periumbilical). There is no guarding.  Musculoskeletal:        General: Normal range of motion.     Cervical back: Normal range of motion and neck supple.  Skin:    General: Skin is warm and dry.     Findings: No rash.  Neurological:     Mental Status: She is alert.     Motor: No abnormal muscle tone.     Coordination: Coordination normal.     ED Results / Procedures / Treatments   Labs (all labs ordered are listed, but only abnormal results are displayed) Labs Reviewed  COMPREHENSIVE METABOLIC PANEL - Abnormal;  Notable for the following components:      Result Value   Sodium 134 (*)    Chloride 96 (*)    Glucose, Bld 311 (*)    BUN 24 (*)    Creatinine, Ser 1.76 (*)    Albumin 2.9 (*)    GFR calc non Af Amer 34 (*)    GFR calc Af Amer 40 (*)    Anion gap 16 (*)    All other components within normal limits  CBC - Abnormal; Notable for the following components:   WBC 16.0 (*)    RBC 5.15 (*)    Platelets 485 (*)    All  other components within normal limits  URINALYSIS, ROUTINE W REFLEX MICROSCOPIC - Abnormal; Notable for the following components:   APPearance TURBID (*)    Glucose, UA >=500 (*)    Hgb urine dipstick SMALL (*)    Bilirubin Urine SMALL (*)    Ketones, ur 15 (*)    Protein, ur >300 (*)    All other components within normal limits  URINALYSIS, MICROSCOPIC (REFLEX) - Abnormal; Notable for the following components:   Bacteria, UA RARE (*)    All other components within normal limits  CBG MONITORING, ED - Abnormal; Notable for the following components:   Glucose-Capillary 390 (*)    All other components within normal limits  CBG MONITORING, ED - Abnormal; Notable for the following components:   Glucose-Capillary 302 (*)    All other components within normal limits  LIPASE, BLOOD  I-STAT BETA HCG BLOOD, ED ([facility], [facility], [facility] ONLY)    EKG None  Radiology No results found.  Procedures Procedures (including critical care time)  Medications Ordered in ED Medications  ondansetron (ZOFRAN-ODT) disintegrating tablet 4 mg (4 mg Oral Given 01/03/20 1952)  ondansetron (ZOFRAN-ODT) disintegrating tablet 4 mg (4 mg Oral Given 01/04/20 0120)  sodium chloride 0.9 % bolus 1,000 mL (1,000 mLs Intravenous New Bag/Given 01/04/20 1353)  sodium chloride 0.9 % bolus 500 mL (500 mLs Intravenous New Bag/Given 01/04/20 1353)  metoCLOPramide (REGLAN) injection 5 mg (5 mg Intravenous Given 01/04/20 1400)    ED Course  I have reviewed the triage vital signs and the nursing notes.  Pertinent labs & imaging results that were available during my care of the patient were reviewed by me and considered in my medical decision making (see chart for details).  Clinical Course as of Jan 03 1541  Sun Jan 04, 2020  1121 Creatinine(!): 1.76 [HK]    Clinical Course User Index [HK] Delia Heady, PA-C   MDM Rules/Calculators/A&P                          44 year old female with a past medical history of gastroparesis,  diabetes, hypertension, hyperlipidemia, fibromyalgia, prior CVA and MI presenting to the ED with a chief complaint of abdominal pain and vomiting for the past 4 days.  Minimal improvement noted with Phenergan given by her PCP.  Reports a lower abdominal discomfort which is typical of her gastroparesis flareups and she is concerned for the same.  Has not been checking her blood sugars at home for the past several weeks as she lost her glucometer.  Denies any fever, cough, chest pain, shortness of breath, bloody stools.  She was given Zofran in triage and reports some improvement with this.  On exam abdomen is minimally tender in the periumbilical area without rebound or guarding.  She is tachycardic which I  feel could be due to her dehydration.  Lab work significant for creatinine of 1.7, her baseline appears about 1.3.  Anion gap of 16, urine with glucosuria, ketones and rare bacteria.  hCG is negative, normal lipase.  Leukocytosis of 16 which I feel could be reactive to her symptoms today.  We will need to obtain EKG as she has had a history of prolonged QT interval, will give IV fluids in the meantime.  EKG showed QTC of 463.  Her CBG is 390 on last recheck.  Will give Reglan, 1.5 L of fluids and reassess.  3:25 PM CBG improved she is sleeping on my reexamination and resting comfortably without complaints.  She is able to tolerate p.o. intake without difficulty.  Her tachycardia has improved with IV fluids.  Requesting discharge as she states she has been here for almost a whole day.  Suspect that her symptoms are due to her gastroparesis.  We will have her continue her home medications, give prescription for a few tablets of Zofran as she states that this helps her and told her to stop taking the Phenergan.  Advised to increase her diet as tolerated.   Patient is hemodynamically stable, in NAD, and able to ambulate in the ED. Evaluation does not show pathology that would require ongoing emergent  intervention or inpatient treatment. I explained the diagnosis to the patient. Pain has been managed and has no complaints prior to discharge. Patient is comfortable with above plan and is stable for discharge at this time. All questions were answered prior to disposition. Strict return precautions for returning to the ED were discussed. Encouraged follow up with PCP.   An After Visit Summary was printed and given to the patient.   Portions of this note were generated with [employer]. Dictation errors may occur despite best attempts at proofreading.  Final Clinical Impression(s) / ED Diagnoses Final diagnoses:  Gastroparesis  Hyperglycemia    Rx / DC Orders ED Discharge Orders         Ordered    ondansetron (ZOFRAN ODT) 4 MG disintegrating tablet  Every 8 hours PRN        01/04/20 1530    blood glucose meter kit and supplies KIT        01/04/20 [place], Jamier Urbas, PA-C 01/04/20 1542    Daleen Bo, MD 01/04/20 2045

## 2020-01-04 NOTE — Discharge Instructions (Addendum)
You can take the Zofran instead of the Phenergan to help with your nausea and vomiting. Follow-up with your primary care provider. Continue your home medications as previously prescribed. Return to the ER if you start to experience worsening abdominal pain, fever, chest pain or vomiting. Make sure you are drinking plenty of fluids to stay hydrated.

## 2020-02-09 ENCOUNTER — Encounter (HOSPITAL_COMMUNITY): Payer: Self-pay

## 2020-02-09 ENCOUNTER — Emergency Department (HOSPITAL_COMMUNITY): Payer: Medicare Other

## 2020-02-09 ENCOUNTER — Emergency Department (HOSPITAL_COMMUNITY)
Admission: EM | Admit: 2020-02-09 | Discharge: 2020-02-09 | Disposition: A | Discharge status: Home / Self Care | Payer: Medicare Other | Attending: Emergency Medicine | Admitting: Emergency Medicine

## 2020-02-09 DIAGNOSIS — Z5321 Procedure and treatment not carried out due to patient leaving prior to being seen by health care provider: Secondary | ICD-10-CM | POA: Diagnosis not present

## 2020-02-09 DIAGNOSIS — R109 Unspecified abdominal pain: Secondary | ICD-10-CM | POA: Diagnosis not present

## 2020-02-09 DIAGNOSIS — R1110 Vomiting, unspecified: Secondary | ICD-10-CM | POA: Insufficient documentation

## 2020-02-09 DIAGNOSIS — R079 Chest pain, unspecified: Secondary | ICD-10-CM | POA: Insufficient documentation

## 2020-02-09 LAB — COMPREHENSIVE METABOLIC PANEL
ALT: 11 U/L (ref 0–44)
AST: 20 U/L (ref 15–41)
Albumin: 2.6 g/dL — ABNORMAL LOW (ref 3.5–5.0)
Alkaline Phosphatase: 61 U/L (ref 38–126)
Anion gap: 13 (ref 5–15)
BUN: 27 mg/dL — ABNORMAL HIGH (ref 6–20)
CO2: 21 mmol/L — ABNORMAL LOW (ref 22–32)
Calcium: 8.8 mg/dL — ABNORMAL LOW (ref 8.9–10.3)
Chloride: 98 mmol/L (ref 98–111)
Creatinine, Ser: 1.67 mg/dL — ABNORMAL HIGH (ref 0.44–1.00)
GFR, Estimated: 36 mL/min — ABNORMAL LOW (ref >60)
Glucose, Bld: 556 mg/dL (ref 70–99)
Potassium: 4.1 mmol/L (ref 3.5–5.1)
Sodium: 132 mmol/L — ABNORMAL LOW (ref 135–145)
Total Bilirubin: 1 mg/dL (ref 0.3–1.2)
Total Protein: 7 g/dL (ref 6.5–8.1)

## 2020-02-09 LAB — CBC
HCT: 40.9 % (ref 36.0–46.0)
Hemoglobin: 13.8 g/dL (ref 12.0–15.0)
MCH: 27.5 pg (ref 26.0–34.0)
MCHC: 33.7 g/dL (ref 30.0–36.0)
MCV: 81.6 fL (ref 80.0–100.0)
Platelets: 473 10*3/uL — ABNORMAL HIGH (ref 150–400)
RBC: 5.01 MIL/uL (ref 3.87–5.11)
RDW: 14.2 % (ref 11.5–15.5)
WBC: 16.8 10*3/uL — ABNORMAL HIGH (ref 4.0–10.5)
nRBC: 0 % (ref 0.0–0.2)

## 2020-02-09 LAB — TROPONIN I (HIGH SENSITIVITY): Troponin I (High Sensitivity): 28 ng/L — ABNORMAL HIGH (ref <18)

## 2020-02-09 LAB — LIPASE, BLOOD: Lipase: 24 U/L (ref 11–51)

## 2020-02-09 LAB — I-STAT BETA HCG BLOOD, ED (MC, WL, AP ONLY): I-stat hCG, quantitative: <5 m[IU]/mL (ref <5)

## 2020-02-09 MED ORDER — ONDANSETRON 4 MG PO TBDP
4.0000 mg | ORAL_TABLET | Freq: Once | ORAL | Status: AC
  Administered 2020-02-09: 4 mg via ORAL
  Filled 2020-02-09: qty 1

## 2020-02-09 NOTE — ED Triage Notes (Signed)
Pt reports chest pain, abd pain that radiates to her back with emesis since thursday

## 2020-03-27 ENCOUNTER — Ambulatory Visit (HOSPITAL_COMMUNITY)
Admission: EM | Admit: 2020-03-27 | Discharge: 2020-03-27 | Disposition: A | Discharge status: Home / Self Care | Payer: Medicare Other | Attending: Family Medicine | Admitting: Family Medicine

## 2020-03-27 ENCOUNTER — Other Ambulatory Visit: Payer: Self-pay

## 2020-03-27 ENCOUNTER — Ambulatory Visit (INDEPENDENT_AMBULATORY_CARE_PROVIDER_SITE_OTHER): Payer: Medicare Other

## 2020-03-27 ENCOUNTER — Encounter (HOSPITAL_COMMUNITY): Payer: Self-pay | Admitting: *Deleted

## 2020-03-27 DIAGNOSIS — M79671 Pain in right foot: Secondary | ICD-10-CM

## 2020-03-27 MED ORDER — TRAMADOL HCL 50 MG PO TABS: 50.0000 mg | ORAL_TABLET | Freq: Four times a day (QID) | ORAL | 0 refills | Status: DC | PRN

## 2020-03-27 NOTE — ED Triage Notes (Signed)
A week ago Pt started to have pain on the bottom of her RT foot . Pt reports she now has a knot on the bottom Of her RT foot and swelling.

## 2020-03-28 ENCOUNTER — Telehealth (HOSPITAL_COMMUNITY): Payer: Self-pay

## 2020-03-28 NOTE — Telephone Encounter (Signed)
Pharmacist from [facility], called concerned about Rx for tramadol and notified [facility] staff that pt is participating in a pain management program. Notified Merrie Roof, PA of same information. [place] advised this RN to call pharmacy and cancel Rx b/c it violates pt pain agreement and to notify pt to continue to take approved Rx for pain. Called pt and notified of same. Spoke with Eldridge Scot, pharmacist of [facility] to confirm cancellation of Rx.

## 2020-03-28 NOTE — ED Provider Notes (Signed)
[place]    CSN: [id number] Arrival date & time: 03/27/20  1601      History   Chief Complaint Chief Complaint  Patient presents with  . Foot Pain    HPI Shirley [name] is a 44 y.o. female.   Here today with about a week of swelling and callus formation on bottom of right foot. She denies any injury to the area or history of similar episodes. Recently broke 2 toes on this foot a few weeks ago which she feels has been improving, otherwise no other foot issues. Trying to rest the foot and avoid weight bearing when able. OTC pain relievers not helping with the pain. No numbness or tingling, discoloration, LE edema, rashes.      Past Medical History:  Diagnosis Date  . Abscess of tunica vaginalis    10/09- Abundant S. aureus- sensitive to all abx  . Anxiety   . Blood dyscrasia   . CAD (coronary artery disease) 06/15/2006   s/p Subendocardial MI with PDA angioplasty(no stent) on 06/15/06 and relook  cath 06/19/06 showed patency of site. Cath 12/10- no restenosis or significant CAD progression  . CVA (cerebral vascular accident) ([place]) ~ 02/2014   denies residual on 04/22/2014  . CVA (cerebral vascular accident) [facility])    history of remote right cerebellar infarct noted on head CT at least since 10/2011  . Depression   . Diabetes mellitus type 2, uncontrolled, with complications ([place])   . Fibromyalgia   . Gastritis   . Gastroparesis    secondary to poorly controlled DM, last emptying study performed 01/2010  was normal but may be falsely positive as pt was on reglan  . GERD (gastroesophageal reflux disease)   . Hepatitis B, chronic (HCC)    Hep BeAb+,Hep B cAb+ & Hep BsAg+ (9/06)  . History of pyelonephritis    H/o GrpB Pyelonephritis (9/06) and UTI- 07/11- E.Coli, 12/10- GBS  . Hyperlipidemia   . Hypertension   . Iron deficiency anemia   . Irregular menses    Small ovarian follicles seen on IR(6/78)  . MI (myocardial infarction) ([place]) 05/2006   PDA  percutaneous transluminal coronary angioplasty  . Migraine    "weekly" (04/22/2014)  . N&V (nausea and vomiting)    Chronic. Unclear etiology with multiple admission and ED visits. CT abdomen with and without contrast (02/2011)  showed no acute process. Gastic Emptying scan (01/2010) was normal. Ultrasound of the abdomen was within normal limits. Hepatitis B viral load was undectable. HIV NR. EGD - gastritis, Hpylori + s/p Rx  . Obesity   . OSA (obstructive sleep apnea)    "suppose to wear mask but I don't" (04/22/2014)  . Peripheral neuropathy   . Pneumonia    "this is probably the 2nd or 3rd time I've had pneumonia" (04/22/2014)  . Recurrent boils   . Seasonal asthma   . Substance abuse ([place])   . Thrombocytosis    Hem/Onc suggested 2/2 chronic hepatits and/or iron deficiency anemia    Patient Active Problem List   Diagnosis Date Noted  . Sepsis due to pneumonia ([place]) 11/24/2019  . Thrombocytosis 11/24/2019  . Chronic pain 11/24/2019  . History of pulmonary embolism 10/10/2017  . Chronic anticoagulation 10/10/2017  . GERD (gastroesophageal reflux disease) 10/10/2017  . Leukocytosis 10/10/2017  . Prolonged QT interval 10/10/2017  . Nausea & vomiting 09/20/2017  . Hypertensive urgency 09/20/2017  . Intractable nausea and vomiting 08/08/2017  . Chronic maxillary sinusitis 01/02/2017  . Acute  blood loss anemia 11/13/2016  . Menorrhagia 11/13/2016  . Bilateral pulmonary embolism ([place]) 10/30/2016  . Acute DVT (deep venous thrombosis) ([place]) 10/30/2016  . Nausea and vomiting 09/23/2016  . Type 2 diabetes mellitus with hyperglycemia ([place]) 04/07/2016  . Diabetic gastroparesis ([place]) 04/06/2016  . Dehydration 03/27/2015  . Gastroparesis 11/11/2014  . Hematemesis 10/13/2014  . DKA (diabetic ketoacidoses) 10/13/2014  . Abdominal pain, chronic, left lower quadrant   . Diabetic gastroparesis associated with type 2 diabetes mellitus ([place]) 04/28/2014  . History of Helicobacter pylori  infection 04/22/2014  . Vaginal discharge 02/18/2014  . Unspecified constipation 07/21/2013  . Tinea corporis 07/21/2013  . Intractable vomiting 04/29/2013  . Dysmenorrhea 04/22/2013  . UTI (urinary tract infection) 07/15/2012  . Headache(784.0) 02/08/2012  . Health care maintenance 01/22/2012  . Chronic hepatitis B ([place]) 03/07/2011  . History of leukocytosis 04/06/2010  . THROMBOCYTOSIS 04/06/2010  . Polysubstance abuse ([place]) 02/23/2010  . Iron deficiency anemia 11/22/2009  . PERIPHERAL NEUROPATHY 10/01/2009  . Hyperlipidemia 08/30/2009  . Diabetic polyneuropathy ([place]) 08/30/2009  . Hidradenitis (recurrent boils) 07/07/2008  . Depression 12/27/2007  . Abdominal pain, left lower quadrant 11/21/2007  . FIBROMYALGIA 10/30/2007  . BACK PAIN 04/01/2007  . OBSTRUCTIVE SLEEP APNEA 01/17/2007  . ANXIETY DEPRESSION 06/27/2006  . Chronic ischemic heart disease 06/15/2006  . OBESITY, MORBID 05/15/2006  . MIGRAINE HEADACHE 05/15/2006  . Asthma 05/15/2006  . Essential hypertension 01/16/2006  . IRREGULAR MENSTRUATION 01/16/2006  . PEDAL EDEMA 01/16/2006  . Poorly controlled type 2 diabetes mellitus with peripheral neuropathy ([place]) 01/16/1989    Past Surgical History:  Procedure Laterality Date  . CESAREAN SECTION  1997  . CORONARY ANGIOPLASTY WITH STENT PLACEMENT  2008   "2 stents"  . ESOPHAGOGASTRODUODENOSCOPY N/A 04/23/2014   Procedure: ESOPHAGOGASTRODUODENOSCOPY (EGD);  Surgeon: Winfield Cunas., MD;  Location: [facility] ENDOSCOPY;  Service: Endoscopy;  Laterality: N/A;  . IR ANGIOGRAM PELVIS SELECTIVE OR SUPRASELECTIVE  12/07/2016  . IR ANGIOGRAM PELVIS SELECTIVE OR SUPRASELECTIVE  12/07/2016  . IR ANGIOGRAM SELECTIVE EACH ADDITIONAL VESSEL  12/07/2016  . IR ANGIOGRAM SELECTIVE EACH ADDITIONAL VESSEL  12/07/2016  . IR EMBO ARTERIAL NOT HEMORR HEMANG INC GUIDE ROADMAPPING  12/07/2016  . IR RADIOLOGIST EVAL & MGMT  01/09/2017  . IR US GUIDE VASC ACCESS RIGHT  12/07/2016    OB History     Gravida  2   Para  2   Term  2   Preterm      AB      Living        SAB      TAB      Ectopic      Multiple      Live Births  2            Home Medications    Prior to Admission medications   Medication Sig Start Date End Date Taking? Authorizing Provider  albuterol (VENTOLIN HFA) 108 (90 Base) MCG/ACT inhaler Inhale 2 puffs into the lungs every 4 (four) hours as needed for wheezing or shortness of breath. 11/30/19   Domenic Polite, MD  amitriptyline (ELAVIL) 100 MG tablet Take 100 mg by mouth daily.  09/03/19   [provider]  amLODipine (NORVASC) 10 MG tablet Take 10 mg by mouth daily. 10/02/19   [provider]  blood glucose meter kit and supplies KIT Dispense based on patient and insurance preference. Use up to four times daily as directed. (FOR ICD-9 250.00, 250.01). 01/04/20   Delia Heady, PA-C  Blood Glucose Monitoring Suppl (ONETOUCH VERIO REFLECT) w/Device KIT 1 each by Does not apply route 3 (three) times daily. 01/03/2019   Kerin Perna, NP  cyclobenzaprine (FLEXERIL) 5 MG tablet Take 5-10 mg by mouth 3 (three) times daily as needed for muscle spasms. 11/05/19   [provider]  fluticasone (FLONASE) 50 MCG/ACT nasal spray Place 2 sprays into both nostrils daily. 11/11/19   [provider]  gabapentin (NEURONTIN) 600 MG tablet Take 600 mg by mouth 2 (two) times daily. 11/05/19   [provider]  glimepiride (AMARYL) 4 MG tablet Take 4 mg by mouth daily. 11/05/19   [provider]  glucose blood (ONETOUCH VERIO) test strip Use as instructed to check blood sugar 3 times daily. Dx codes: E11.8, E11.65 2019-01-03   Charlott Rakes, MD  HUMALOG KWIKPEN 100 UNIT/ML KwikPen Inject 7 Units into the skin 4 (four) times daily -  before meals and at bedtime. Hold if blood glucose is less than 110 11/11/19   [provider]  hydrOXYzine (ATARAX/VISTARIL) 50 MG tablet Take 25-50 mg by mouth at bedtime as needed for  anxiety.  09/03/19   [provider]  insulin glargine (LANTUS SOLOSTAR) 100 UNIT/ML Solostar Pen Inject 30 Units into the skin daily. 11/30/19   Domenic Polite, MD  Insulin Pen Needle (PEN NEEDLES 3/16") 31G X 5 MM MISC 1 Device by Does not apply route 4 (four) times daily - after meals and at bedtime. 11/28/18   Kerin Perna, NP  INSULIN SYRINGE .5CC/28G (INS SYRINGE/NEEDLE .5CC/28G) 28G X 1/2" 0.5 ML MISC Please provide 1 month supply 09/04/17   Colbert Ewing, MD  levocetirizine (XYZAL) 5 MG tablet Take 1 tablet (5 mg total) by mouth every evening. 11/28/18   Kerin Perna, NP  losartan-hydrochlorothiazide (HYZAAR) 100-25 MG tablet Take 1 tablet by mouth daily. 11/28/18   Kerin Perna, NP  ondansetron (ZOFRAN ODT) 4 MG disintegrating tablet Take 1 tablet (4 mg total) by mouth every 8 (eight) hours as needed for nausea or vomiting. 01/04/20   Khatri, Hina, PA-C  OneTouch Delica Lancets 29F MISC Use to check blood sugar 3 times daily. Dx codes: E11.8, E11.65 03-Jan-2019   Charlott Rakes, MD  oxyCODONE-acetaminophen (PERCOCET) 10-325 MG tablet Take 1 tablet by mouth 4 (four) times daily as needed for pain. 11/05/19   [provider]  pravastatin (PRAVACHOL) 80 MG tablet Take 80 mg by mouth daily. 11/05/19   [provider]  ramelteon (ROZEREM) 8 MG tablet Take 8 mg by mouth at bedtime. 06/19/19   [provider]  sitaGLIPtin (JANUVIA) 100 MG tablet Take 1 tablet (100 mg total) by mouth daily. 11/28/18   Kerin Perna, NP  traMADol (ULTRAM) 50 MG tablet Take 1 tablet (50 mg total) by mouth every 6 (six) hours as needed. 03/27/20   Volney American, PA-C  traZODone (DESYREL) 50 MG tablet Take 0.5-1 tablets (25-50 mg total) by mouth at bedtime as needed for sleep. 11/28/18   Kerin Perna, NP  TRULICITY 4.5 AO/1.3YQ SOPN Inject 4.5 mg into the skin once a week. 11/11/19   [provider]  Vitamin D, Ergocalciferol, (DRISDOL) 1.25 MG (50000  UNIT) CAPS capsule Take 50,000 Units by mouth once a week. 11/11/19   [provider]    Family History Family History  Problem Relation Age of Onset  . Diabetes Father   . Healthy Mother     Social History Social History   Tobacco Use  .  Smoking status: Former Smoker    Types: Cigarettes    Quit date: 04/24/1996    Years since quitting: 23.9  . Smokeless tobacco: Never Used  . Tobacco comment: quit smoking cigarettes age 53  Vaping Use  . Vaping Use: Never used  Substance Use Topics  . Alcohol use: No    Alcohol/week: 0.0 standard drinks    Comment: 04/22/2014 "might have a few drinks a month"  . Drug use: Not Currently    Types: Marijuana, Cocaine    Comment: 04/22/2104 "quit drugs ~ 1-2 yr ago"     Allergies   Lisinopril and Morphine and related   Review of Systems Review of Systems PER HPI    Physical Exam Triage Vital Signs ED Triage Vitals  Enc Vitals Group     BP 03/27/20 1653 (!) 159/95     Pulse Rate 03/27/20 1653 66     Resp 03/27/20 1653 18     Temp 03/27/20 1653 98.6 F (37 C)     Temp Source 03/27/20 1653 Oral     SpO2 --      Weight 03/27/20 1656 223 lb (101.2 kg)     Height 03/27/20 1656 5' 7" (1.702 m)     Head Circumference --      Peak Flow --      Pain Score 03/27/20 1655 9     Pain Loc --      Pain Edu? --      Excl. in [place]? --    No data found.  Updated Vital Signs BP (!) 159/95 (BP Location: Right Arm)   Pulse 66   Temp 98.6 F (37 C) (Oral)   Resp 18   Ht 5' 7" (1.702 m)   Wt 223 lb (101.2 kg)   LMP 03/21/2020   BMI 34.93 kg/m   Visual Acuity Right Eye Distance:   Left Eye Distance:   Bilateral Distance:    Right Eye Near:   Left Eye Near:    Bilateral Near:     Physical Exam Vitals and nursing note reviewed.  Constitutional:      Appearance: Normal appearance. She is not ill-appearing.  HENT:     Head: Atraumatic.  Eyes:     Extraocular Movements: Extraocular movements intact.      Conjunctiva/sclera: Conjunctivae normal.  Cardiovascular:     Rate and Rhythm: Normal rate and regular rhythm.     Pulses: Normal pulses.     Heart sounds: Normal heart sounds.  Pulmonary:     Effort: Pulmonary effort is normal.     Breath sounds: Normal breath sounds.  Musculoskeletal:        General: Normal range of motion.     Cervical back: Normal range of motion and neck supple.  Skin:    General: Skin is warm and dry.     Comments: Area of callus formation with slight bruising about 2.5 cm below great toe right foot. Significantly ttp. No obvious foreign body, rash, no active drainage from the area.   Neurological:     Mental Status: She is alert and oriented to person, place, and time.     Sensory: No sensory deficit.  Psychiatric:        Mood and Affect: Mood normal.        Thought Content: Thought content normal.        Judgment: Judgment normal.      UC Treatments / Results  Labs (all labs ordered are listed, but only  abnormal results are displayed) Labs Reviewed - No data to display  EKG   Radiology DG Foot Complete Right  Result Date: 03/27/2020 CLINICAL DATA:  Localized right foot pain and swelling with lump on the plantar surface EXAM: RIGHT FOOT COMPLETE - 3+ VIEW COMPARISON:  None. FINDINGS: Focal soft tissue swelling is noted towards the level of the forefoot. This on a background of more diffuse mild edematous changes. Benign soft tissue mineralization seen anterior to the tibia. No clear soft tissue gas or foreign body is seen. Irregularity of the first nail plate, indeterminate, correlate with exam. There is some persistent lucency and callus formation about the base of the fourth proximal phalanx which suggests incomplete healing of a previously seen fracture. No acute fracture or traumatic osseous injury is identified. Tiny ossicle at the first metatarsophalangeal joint separate from the hallucal sesamoids. Plantar calcaneal spurring is noted. IMPRESSION: 1.  Focal soft tissue swelling towards the level of the forefoot. No clear soft tissue gas or foreign body. 2. Irregularity of the first nail plate, indeterminate, correlate with exam. 3. Some persistent lucency and callus formation about the base of the fourth proximal phalanx which suggests incomplete healing of a previously seen fracture. 4. No new acute osseous abnormality. Electronically Signed   By: Lovena Le M.D.   On: 03/27/2020 17:53    Procedures Procedures (including critical care time)  Medications Ordered in UC Medications - No data to display  Initial Impression / Assessment and Plan / UC Course  I have reviewed the triage vital signs and the nursing notes.  Pertinent labs & imaging results that were available during my care of the patient were reviewed by me and considered in my medical decision making (see chart for details).     X-ray showing soft tissue swelling in the area of her pain but no obvious foreign body or signs of infection. Incomplete healing also in area of recent toe fracture. Discussed tramadol for rare prn pain relief, close Podiatry f/u first thing next week for re-check. RICE protocol. Return if worsening in meantime.  Final Clinical Impressions(s) / UC Diagnoses   Final diagnoses:  Foot pain, right   Discharge Instructions   None    ED Prescriptions    Medication Sig Dispense Auth. Provider   traMADol (ULTRAM) 50 MG tablet Take 1 tablet (50 mg total) by mouth every 6 (six) hours as needed. 15 tablet Volney American, [place]     I have reviewed the PDMP during this encounter.   Merrie Roof [place], [place] 03/28/20 [phone]

## 2020-04-05 ENCOUNTER — Encounter (HOSPITAL_COMMUNITY): Payer: Self-pay

## 2020-04-05 ENCOUNTER — Other Ambulatory Visit: Payer: Self-pay

## 2020-04-05 ENCOUNTER — Ambulatory Visit (INDEPENDENT_AMBULATORY_CARE_PROVIDER_SITE_OTHER): Payer: Medicare Other | Admitting: Podiatry

## 2020-04-05 DIAGNOSIS — L97509 Non-pressure chronic ulcer of other part of unspecified foot with unspecified severity: Secondary | ICD-10-CM | POA: Diagnosis not present

## 2020-04-05 DIAGNOSIS — L02611 Cutaneous abscess of right foot: Secondary | ICD-10-CM | POA: Diagnosis not present

## 2020-04-05 DIAGNOSIS — J45909 Unspecified asthma, uncomplicated: Secondary | ICD-10-CM | POA: Diagnosis present

## 2020-04-05 DIAGNOSIS — G4733 Obstructive sleep apnea (adult) (pediatric): Secondary | ICD-10-CM | POA: Diagnosis present

## 2020-04-05 DIAGNOSIS — E1165 Type 2 diabetes mellitus with hyperglycemia: Secondary | ICD-10-CM

## 2020-04-05 DIAGNOSIS — D509 Iron deficiency anemia, unspecified: Secondary | ICD-10-CM | POA: Diagnosis present

## 2020-04-05 DIAGNOSIS — N1832 Chronic kidney disease, stage 3b: Secondary | ICD-10-CM | POA: Diagnosis present

## 2020-04-05 DIAGNOSIS — Z955 Presence of coronary angioplasty implant and graft: Secondary | ICD-10-CM

## 2020-04-05 DIAGNOSIS — Z79899 Other long term (current) drug therapy: Secondary | ICD-10-CM

## 2020-04-05 DIAGNOSIS — S91331A Puncture wound without foreign body, right foot, initial encounter: Secondary | ICD-10-CM | POA: Diagnosis present

## 2020-04-05 DIAGNOSIS — E1122 Type 2 diabetes mellitus with diabetic chronic kidney disease: Secondary | ICD-10-CM | POA: Diagnosis present

## 2020-04-05 DIAGNOSIS — E11628 Type 2 diabetes mellitus with other skin complications: Secondary | ICD-10-CM | POA: Diagnosis not present

## 2020-04-05 DIAGNOSIS — Z6832 Body mass index (BMI) 32.0-32.9, adult: Secondary | ICD-10-CM

## 2020-04-05 DIAGNOSIS — E11621 Type 2 diabetes mellitus with foot ulcer: Secondary | ICD-10-CM | POA: Diagnosis not present

## 2020-04-05 DIAGNOSIS — I252 Old myocardial infarction: Secondary | ICD-10-CM

## 2020-04-05 DIAGNOSIS — B9562 Methicillin resistant Staphylococcus aureus infection as the cause of diseases classified elsewhere: Secondary | ICD-10-CM | POA: Diagnosis present

## 2020-04-05 DIAGNOSIS — L03115 Cellulitis of right lower limb: Secondary | ICD-10-CM

## 2020-04-05 DIAGNOSIS — I129 Hypertensive chronic kidney disease with stage 1 through stage 4 chronic kidney disease, or unspecified chronic kidney disease: Secondary | ICD-10-CM | POA: Diagnosis present

## 2020-04-05 DIAGNOSIS — IMO0002 Reserved for concepts with insufficient information to code with codable children: Secondary | ICD-10-CM

## 2020-04-05 DIAGNOSIS — Z20822 Contact with and (suspected) exposure to covid-19: Secondary | ICD-10-CM | POA: Diagnosis present

## 2020-04-05 DIAGNOSIS — K219 Gastro-esophageal reflux disease without esophagitis: Secondary | ICD-10-CM | POA: Diagnosis present

## 2020-04-05 DIAGNOSIS — Z794 Long term (current) use of insulin: Secondary | ICD-10-CM

## 2020-04-05 DIAGNOSIS — E669 Obesity, unspecified: Secondary | ICD-10-CM | POA: Diagnosis present

## 2020-04-05 DIAGNOSIS — E1143 Type 2 diabetes mellitus with diabetic autonomic (poly)neuropathy: Secondary | ICD-10-CM | POA: Diagnosis present

## 2020-04-05 DIAGNOSIS — Z833 Family history of diabetes mellitus: Secondary | ICD-10-CM

## 2020-04-05 DIAGNOSIS — Z885 Allergy status to narcotic agent status: Secondary | ICD-10-CM

## 2020-04-05 DIAGNOSIS — Z87891 Personal history of nicotine dependence: Secondary | ICD-10-CM

## 2020-04-05 DIAGNOSIS — E871 Hypo-osmolality and hyponatremia: Secondary | ICD-10-CM | POA: Diagnosis present

## 2020-04-05 DIAGNOSIS — E785 Hyperlipidemia, unspecified: Secondary | ICD-10-CM | POA: Diagnosis present

## 2020-04-05 DIAGNOSIS — I2510 Atherosclerotic heart disease of native coronary artery without angina pectoris: Secondary | ICD-10-CM | POA: Diagnosis present

## 2020-04-05 DIAGNOSIS — Z8673 Personal history of transient ischemic attack (TIA), and cerebral infarction without residual deficits: Secondary | ICD-10-CM

## 2020-04-05 DIAGNOSIS — Z888 Allergy status to other drugs, medicaments and biological substances status: Secondary | ICD-10-CM

## 2020-04-05 DIAGNOSIS — M797 Fibromyalgia: Secondary | ICD-10-CM | POA: Diagnosis present

## 2020-04-05 LAB — CBC WITH DIFFERENTIAL/PLATELET
Abs Immature Granulocytes: 0.05 10*3/uL (ref 0.00–0.07)
Basophils Absolute: 0.1 10*3/uL (ref 0.0–0.1)
Basophils Relative: 0 %
Eosinophils Absolute: 0.3 10*3/uL (ref 0.0–0.5)
Eosinophils Relative: 2 %
HCT: 34.6 % — ABNORMAL LOW (ref 36.0–46.0)
Hemoglobin: 11.9 g/dL — ABNORMAL LOW (ref 12.0–15.0)
Immature Granulocytes: 0 %
Lymphocytes Relative: 23 %
Lymphs Abs: 3.2 10*3/uL (ref 0.7–4.0)
MCH: 27.3 pg (ref 26.0–34.0)
MCHC: 34.4 g/dL (ref 30.0–36.0)
MCV: 79.4 fL — ABNORMAL LOW (ref 80.0–100.0)
Monocytes Absolute: 0.9 10*3/uL (ref 0.1–1.0)
Monocytes Relative: 6 %
Neutro Abs: 9.6 10*3/uL — ABNORMAL HIGH (ref 1.7–7.7)
Neutrophils Relative %: 69 %
Platelets: 372 10*3/uL (ref 150–400)
RBC: 4.36 MIL/uL (ref 3.87–5.11)
RDW: 14 % (ref 11.5–15.5)
WBC: 14.1 10*3/uL — ABNORMAL HIGH (ref 4.0–10.5)
nRBC: 0 % (ref 0.0–0.2)

## 2020-04-05 LAB — COMPREHENSIVE METABOLIC PANEL
ALT: 8 U/L (ref 0–44)
AST: 15 U/L (ref 15–41)
Albumin: 2.7 g/dL — ABNORMAL LOW (ref 3.5–5.0)
Alkaline Phosphatase: 61 U/L (ref 38–126)
Anion gap: 7 (ref 5–15)
BUN: 18 mg/dL (ref 6–20)
CO2: 24 mmol/L (ref 22–32)
Calcium: 8.7 mg/dL — ABNORMAL LOW (ref 8.9–10.3)
Chloride: 101 mmol/L (ref 98–111)
Creatinine, Ser: 1.3 mg/dL — ABNORMAL HIGH (ref 0.44–1.00)
GFR, Estimated: 51 mL/min — ABNORMAL LOW (ref >60)
Glucose, Bld: 359 mg/dL — ABNORMAL HIGH (ref 70–99)
Potassium: 3.8 mmol/L (ref 3.5–5.1)
Sodium: 132 mmol/L — ABNORMAL LOW (ref 135–145)
Total Bilirubin: 0.3 mg/dL (ref 0.3–1.2)
Total Protein: 7.1 g/dL (ref 6.5–8.1)

## 2020-04-05 NOTE — ED Triage Notes (Signed)
Pt reports being sent to ER by Podiatrist to be admitted for infection on bottom of right foot and swelling. Pt reports it has been getting worse for 2 weeks.

## 2020-04-05 NOTE — Progress Notes (Signed)
Subjective:   Patient ID: Shirley [name], female   DOB: 44 y.o.   MRN: [id number]   HPI 44 year old female presents the office today for concerns of a painful callus on the bottom of her right foot that she is first noticed about 3 weeks ago.  She states she went to urgent care about 10 days ago and she has been on doxycycline.  Despite this she states that she still having pain, swelling to her foot also her leg.  She states that she not put pressure on her foot because of discomfort.  She denies of any foreign objects.  Denies any injury.  She is diabetic her last A1c was 12.  No other concerns today.  Currently denies any fevers or chills.   Review of Systems  All other systems reviewed and are negative.  Past Medical History:  Diagnosis Date  . Abscess of tunica vaginalis    10/09- Abundant S. aureus- sensitive to all abx  . Anxiety   . Blood dyscrasia   . CAD (coronary artery disease) 06/15/2006   s/p Subendocardial MI with PDA angioplasty(no stent) on 06/15/06 and relook  cath 06/19/06 showed patency of site. Cath 12/10- no restenosis or significant CAD progression  . CVA (cerebral vascular accident) ([place]) ~ 02/2014   denies residual on 04/22/2014  . CVA (cerebral vascular accident) [facility])    history of remote right cerebellar infarct noted on head CT at least since 10/2011  . Depression   . Diabetes mellitus type 2, uncontrolled, with complications ([place])   . Fibromyalgia   . Gastritis   . Gastroparesis    secondary to poorly controlled DM, last emptying study performed 01/2010  was normal but may be falsely positive as pt was on reglan  . GERD (gastroesophageal reflux disease)   . Hepatitis B, chronic (HCC)    Hep BeAb+,Hep B cAb+ & Hep BsAg+ (9/06)  . History of pyelonephritis    H/o GrpB Pyelonephritis (9/06) and UTI- 07/11- E.Coli, 12/10- GBS  . Hyperlipidemia   . Hypertension   . Iron deficiency anemia   . Irregular menses    Small ovarian follicles seen on HE(5/27)  .  MI (myocardial infarction) ([place]) 05/2006   PDA percutaneous transluminal coronary angioplasty  . Migraine    "weekly" (04/22/2014)  . N&V (nausea and vomiting)    Chronic. Unclear etiology with multiple admission and ED visits. CT abdomen with and without contrast (02/2011)  showed no acute process. Gastic Emptying scan (01/2010) was normal. Ultrasound of the abdomen was within normal limits. Hepatitis B viral load was undectable. HIV NR. EGD - gastritis, Hpylori + s/p Rx  . Obesity   . OSA (obstructive sleep apnea)    "suppose to wear mask but I don't" (04/22/2014)  . Peripheral neuropathy   . Pneumonia    "this is probably the 2nd or 3rd time I've had pneumonia" (04/22/2014)  . Recurrent boils   . Seasonal asthma   . Substance abuse ([place])   . Thrombocytosis    Hem/Onc suggested 2/2 chronic hepatits and/or iron deficiency anemia    Past Surgical History:  Procedure Laterality Date  . CESAREAN SECTION  1997  . CORONARY ANGIOPLASTY WITH STENT PLACEMENT  2008   "2 stents"  . ESOPHAGOGASTRODUODENOSCOPY N/A 04/23/2014   Procedure: ESOPHAGOGASTRODUODENOSCOPY (EGD);  Surgeon: Winfield Cunas., MD;  Location[facility] ENDOSCOPY;  Service: Endoscopy;  Laterality: N/A;  . IR ANGIOGRAM PELVIS SELECTIVE OR SUPRASELECTIVE  12/07/2016  . IR ANGIOGRAM PELVIS SELECTIVE  OR SUPRASELECTIVE  12/07/2016  . IR ANGIOGRAM SELECTIVE EACH ADDITIONAL VESSEL  12/07/2016  . IR ANGIOGRAM SELECTIVE EACH ADDITIONAL VESSEL  12/07/2016  . IR EMBO ARTERIAL NOT HEMORR HEMANG INC GUIDE ROADMAPPING  12/07/2016  . IR RADIOLOGIST EVAL & MGMT  01/09/2017  . IR US GUIDE VASC ACCESS RIGHT  12/07/2016     Current Outpatient Medications:  .  albuterol (VENTOLIN HFA) 108 (90 Base) MCG/ACT inhaler, Inhale 2 puffs into the lungs every 4 (four) hours as needed for wheezing or shortness of breath., Disp: 8 g, Rfl: 1 .  amitriptyline (ELAVIL) 100 MG tablet, Take 100 mg by mouth daily. , Disp: , Rfl:  .  amLODipine (NORVASC) 10 MG  tablet, Take 10 mg by mouth daily., Disp: , Rfl:  .  blood glucose meter kit and supplies KIT, Dispense based on patient and insurance preference. Use up to four times daily as directed. (FOR ICD-9 250.00, 250.01)., Disp: 1 each, Rfl: 0 .  Blood Glucose Monitoring Suppl (ONETOUCH VERIO REFLECT) w/Device KIT, 1 each by Does not apply route 3 (three) times daily., Disp: 1 kit, Rfl: 0 .  cyclobenzaprine (FLEXERIL) 5 MG tablet, Take 5-10 mg by mouth 3 (three) times daily as needed for muscle spasms., Disp: , Rfl:  .  fluticasone (FLONASE) 50 MCG/ACT nasal spray, Place 2 sprays into both nostrils daily., Disp: , Rfl:  .  gabapentin (NEURONTIN) 600 MG tablet, Take 600 mg by mouth 2 (two) times daily., Disp: , Rfl:  .  glimepiride (AMARYL) 4 MG tablet, Take 4 mg by mouth daily., Disp: , Rfl:  .  glucose blood (ONETOUCH VERIO) test strip, Use as instructed to check blood sugar 3 times daily. Dx codes: E11.8, E11.65, Disp: 100 each, Rfl: 11 .  HUMALOG KWIKPEN 100 UNIT/ML KwikPen, Inject 7 Units into the skin 4 (four) times daily -  before meals and at bedtime. Hold if blood glucose is less than 110, Disp: , Rfl:  .  hydrOXYzine (ATARAX/VISTARIL) 50 MG tablet, Take 25-50 mg by mouth at bedtime as needed for anxiety. , Disp: , Rfl:  .  insulin glargine (LANTUS SOLOSTAR) 100 UNIT/ML Solostar Pen, Inject 30 Units into the skin daily., Disp: , Rfl:  .  Insulin Pen Needle (PEN NEEDLES 3/16") 31G X 5 MM MISC, 1 Device by Does not apply route 4 (four) times daily - after meals and at bedtime., Disp: 100 each, Rfl: 5 .  INSULIN SYRINGE .5CC/28G (INS SYRINGE/NEEDLE .5CC/28G) 28G X 1/2" 0.5 ML MISC, Please provide 1 month supply, Disp: 100 each, Rfl: 0 .  levocetirizine (XYZAL) 5 MG tablet, Take 1 tablet (5 mg total) by mouth every evening., Disp: 30 tablet, Rfl: 11 .  losartan-hydrochlorothiazide (HYZAAR) 100-25 MG tablet, Take 1 tablet by mouth daily., Disp: 90 tablet, Rfl: 3 .  ondansetron (ZOFRAN ODT) 4 MG  disintegrating tablet, Take 1 tablet (4 mg total) by mouth every 8 (eight) hours as needed for nausea or vomiting., Disp: 3 tablet, Rfl: 0 .  OneTouch Delica Lancets 48N MISC, Use to check blood sugar 3 times daily. Dx codes: E11.8, E11.65, Disp: 100 each, Rfl: 11 .  oxyCODONE-acetaminophen (PERCOCET) 10-325 MG tablet, Take 1 tablet by mouth 4 (four) times daily as needed for pain., Disp: , Rfl:  .  pravastatin (PRAVACHOL) 80 MG tablet, Take 80 mg by mouth daily., Disp: , Rfl:  .  ramelteon (ROZEREM) 8 MG tablet, Take 8 mg by mouth at bedtime., Disp: , Rfl:  .  sitaGLIPtin (JANUVIA)  100 MG tablet, Take 1 tablet (100 mg total) by mouth daily., Disp: 330 tablet, Rfl: 3 .  traMADol (ULTRAM) 50 MG tablet, Take 1 tablet (50 mg total) by mouth every 6 (six) hours as needed., Disp: 15 tablet, Rfl: 0 .  traZODone (DESYREL) 50 MG tablet, Take 0.5-1 tablets (25-50 mg total) by mouth at bedtime as needed for sleep., Disp: 30 tablet, Rfl: 3 .  TRULICITY 4.5 SH/6.8HF SOPN, Inject 4.5 mg into the skin once a week., Disp: , Rfl:  .  Vitamin D, Ergocalciferol, (DRISDOL) 1.25 MG (50000 UNIT) CAPS capsule, Take 50,000 Units by mouth once a week., Disp: , Rfl:   Allergies  Allergen Reactions  . Lisinopril Nausea Only and Swelling  . Morphine And Related Itching and Swelling        Objective:  Physical Exam  General: AAO x3, NAD  Dermatological: On the plantar aspect of the right foot on the midfoot area is what appears to be callus, puncture wound and there is what seems to be fluid, possible abscess underneath the skin.  There is tenderness palpation this area.  There is edema to the plantar musculature extending to the dorsal foot, leg.  Mild warmth of the foot.  Vascular: Dorsalis Pedis artery and Posterior Tibial artery pedal pulses are 2/4 bilateral with immedate capillary fill time.  There is no pain with calf compression, swelling, warmth, erythema.   Neruologic: Grossly intact via light touch  bilateral.   Musculoskeletal: Tenderness the skin lesion plantar foot muscular strength 5/5 in all groups tested bilateral.  Gait: Unassisted, Nonantalgic.       Assessment:   Right foot cellulitis, concern for abscess     Plan:  -Treatment options discussed including all alternatives, risks, and complications -Etiology of symptoms were discussed -Inability reviewed the x-rays from the urgent care.  Given the swelling, pain concern for abscess and she has been on oral antibiotics I do recommend admission the hospital for IV antibiotics, MRI and likely surgical incision and drainage this week.  We discussed trying to this as an outpatient but I am concerned about the infection.  I tried to directly admit her to the hospital but unfortunately due to the bed availability not able to do so.  I recommend her go to the emergency room.  She is already on antibiotics and systemically she is well.  She is coming to the hospital later on today after she takes care of personal issues.  45 minutes were spent with the patient coordinating care and greater than 50% of time was in face-to-face contact.  Trula Slade DPM

## 2020-04-05 NOTE — ED Notes (Signed)
LACTIC ACID ALSO COLLECTED

## 2020-04-06 ENCOUNTER — Inpatient Hospital Stay (HOSPITAL_COMMUNITY): Payer: Medicare Other

## 2020-04-06 ENCOUNTER — Emergency Department (HOSPITAL_COMMUNITY): Payer: Medicare Other

## 2020-04-06 ENCOUNTER — Inpatient Hospital Stay (HOSPITAL_COMMUNITY)
Admission: EM | Admit: 2020-04-06 | Discharge: 2020-04-09 | DRG: 638 | Disposition: A | Discharge status: Home Health | Payer: Medicare Other | Source: Ambulatory Visit | Attending: Internal Medicine | Admitting: Internal Medicine

## 2020-04-06 ENCOUNTER — Telehealth: Payer: Self-pay | Admitting: Podiatry

## 2020-04-06 DIAGNOSIS — B9562 Methicillin resistant Staphylococcus aureus infection as the cause of diseases classified elsewhere: Secondary | ICD-10-CM | POA: Diagnosis present

## 2020-04-06 DIAGNOSIS — M71071 Abscess of bursa, right ankle and foot: Secondary | ICD-10-CM | POA: Diagnosis not present

## 2020-04-06 DIAGNOSIS — Z955 Presence of coronary angioplasty implant and graft: Secondary | ICD-10-CM | POA: Diagnosis not present

## 2020-04-06 DIAGNOSIS — E669 Obesity, unspecified: Secondary | ICD-10-CM | POA: Diagnosis present

## 2020-04-06 DIAGNOSIS — I129 Hypertensive chronic kidney disease with stage 1 through stage 4 chronic kidney disease, or unspecified chronic kidney disease: Secondary | ICD-10-CM | POA: Diagnosis present

## 2020-04-06 DIAGNOSIS — D509 Iron deficiency anemia, unspecified: Secondary | ICD-10-CM | POA: Diagnosis present

## 2020-04-06 DIAGNOSIS — M797 Fibromyalgia: Secondary | ICD-10-CM | POA: Diagnosis present

## 2020-04-06 DIAGNOSIS — L03115 Cellulitis of right lower limb: Secondary | ICD-10-CM | POA: Diagnosis present

## 2020-04-06 DIAGNOSIS — I2510 Atherosclerotic heart disease of native coronary artery without angina pectoris: Secondary | ICD-10-CM | POA: Diagnosis present

## 2020-04-06 DIAGNOSIS — Z6832 Body mass index (BMI) 32.0-32.9, adult: Secondary | ICD-10-CM | POA: Diagnosis not present

## 2020-04-06 DIAGNOSIS — Z8673 Personal history of transient ischemic attack (TIA), and cerebral infarction without residual deficits: Secondary | ICD-10-CM | POA: Diagnosis not present

## 2020-04-06 DIAGNOSIS — G4733 Obstructive sleep apnea (adult) (pediatric): Secondary | ICD-10-CM | POA: Diagnosis present

## 2020-04-06 DIAGNOSIS — S91331A Puncture wound without foreign body, right foot, initial encounter: Secondary | ICD-10-CM | POA: Insufficient documentation

## 2020-04-06 DIAGNOSIS — L089 Local infection of the skin and subcutaneous tissue, unspecified: Secondary | ICD-10-CM

## 2020-04-06 DIAGNOSIS — E11628 Type 2 diabetes mellitus with other skin complications: Secondary | ICD-10-CM

## 2020-04-06 DIAGNOSIS — E1122 Type 2 diabetes mellitus with diabetic chronic kidney disease: Secondary | ICD-10-CM | POA: Diagnosis present

## 2020-04-06 DIAGNOSIS — E1143 Type 2 diabetes mellitus with diabetic autonomic (poly)neuropathy: Secondary | ICD-10-CM | POA: Diagnosis present

## 2020-04-06 DIAGNOSIS — E871 Hypo-osmolality and hyponatremia: Secondary | ICD-10-CM | POA: Diagnosis present

## 2020-04-06 DIAGNOSIS — E785 Hyperlipidemia, unspecified: Secondary | ICD-10-CM | POA: Diagnosis present

## 2020-04-06 DIAGNOSIS — J45909 Unspecified asthma, uncomplicated: Secondary | ICD-10-CM | POA: Diagnosis present

## 2020-04-06 DIAGNOSIS — E1165 Type 2 diabetes mellitus with hyperglycemia: Secondary | ICD-10-CM | POA: Diagnosis present

## 2020-04-06 DIAGNOSIS — N1832 Chronic kidney disease, stage 3b: Secondary | ICD-10-CM | POA: Diagnosis present

## 2020-04-06 DIAGNOSIS — L02611 Cutaneous abscess of right foot: Secondary | ICD-10-CM | POA: Diagnosis present

## 2020-04-06 DIAGNOSIS — I252 Old myocardial infarction: Secondary | ICD-10-CM | POA: Diagnosis not present

## 2020-04-06 DIAGNOSIS — Z20822 Contact with and (suspected) exposure to covid-19: Secondary | ICD-10-CM | POA: Diagnosis present

## 2020-04-06 DIAGNOSIS — K219 Gastro-esophageal reflux disease without esophagitis: Secondary | ICD-10-CM | POA: Diagnosis present

## 2020-04-06 LAB — RESP PANEL BY RT-PCR (FLU A&B, COVID) ARPGX2
Influenza A by PCR: NEGATIVE
Influenza B by PCR: NEGATIVE
SARS Coronavirus 2 by RT PCR: NEGATIVE

## 2020-04-06 LAB — CBG MONITORING, ED: Glucose-Capillary: 412 mg/dL — ABNORMAL HIGH (ref 70–99)

## 2020-04-06 LAB — LACTIC ACID, PLASMA: Lactic Acid, Venous: 0.9 mmol/L (ref 0.5–1.9)

## 2020-04-06 MED ORDER — FENTANYL CITRATE (PF) 100 MCG/2ML IJ SOLN
12.5000 ug | INTRAMUSCULAR | Status: DC | PRN
  Administered 2020-04-07 – 2020-04-09 (×6): 12.5 ug via INTRAVENOUS
  Filled 2020-04-06 (×7): qty 2

## 2020-04-06 MED ORDER — VANCOMYCIN HCL 1750 MG/350ML IV SOLN
1750.0000 mg | Freq: Once | INTRAVENOUS | Status: AC
  Administered 2020-04-06: 09:00:00 1750 mg via INTRAVENOUS
  Filled 2020-04-06: qty 350

## 2020-04-06 MED ORDER — OXYCODONE HCL 5 MG PO TABS
5.0000 mg | ORAL_TABLET | ORAL | Status: DC | PRN
  Administered 2020-04-06 – 2020-04-09 (×7): 5 mg via ORAL
  Filled 2020-04-06 (×7): qty 1

## 2020-04-06 MED ORDER — ONDANSETRON HCL 4 MG PO TABS
4.0000 mg | ORAL_TABLET | Freq: Four times a day (QID) | ORAL | Status: DC | PRN
  Administered 2020-04-08 – 2020-04-09 (×2): 4 mg via ORAL
  Filled 2020-04-06 (×2): qty 1

## 2020-04-06 MED ORDER — AMLODIPINE BESYLATE 10 MG PO TABS
10.0000 mg | ORAL_TABLET | Freq: Every day | ORAL | Status: DC
  Administered 2020-04-06 – 2020-04-09 (×4): 10 mg via ORAL
  Filled 2020-04-06: qty 2
  Filled 2020-04-06 (×3): qty 1

## 2020-04-06 MED ORDER — ALBUTEROL SULFATE HFA 108 (90 BASE) MCG/ACT IN AERS: 2.0000 | INHALATION_SPRAY | RESPIRATORY_TRACT | Status: DC | PRN

## 2020-04-06 MED ORDER — SODIUM CHLORIDE 0.9 % IV SOLN
2.0000 g | Freq: Once | INTRAVENOUS | Status: AC
  Administered 2020-04-06: 12:00:00 2 g via INTRAVENOUS
  Filled 2020-04-06: qty 2

## 2020-04-06 MED ORDER — INSULIN ASPART 100 UNIT/ML ~~LOC~~ SOLN
0.0000 [IU] | Freq: Every day | SUBCUTANEOUS | Status: DC
  Administered 2020-04-06: 5 [IU] via SUBCUTANEOUS
  Administered 2020-04-07 – 2020-04-08 (×2): 2 [IU] via SUBCUTANEOUS
  Filled 2020-04-06: qty 0.05

## 2020-04-06 MED ORDER — ACETAMINOPHEN 325 MG PO TABS
650.0000 mg | ORAL_TABLET | Freq: Four times a day (QID) | ORAL | Status: DC | PRN
  Administered 2020-04-06 – 2020-04-09 (×3): 650 mg via ORAL
  Filled 2020-04-06 (×3): qty 2

## 2020-04-06 MED ORDER — INSULIN ASPART 100 UNIT/ML ~~LOC~~ SOLN
0.0000 [IU] | Freq: Three times a day (TID) | SUBCUTANEOUS | Status: DC
  Administered 2020-04-07 (×2): 2 [IU] via SUBCUTANEOUS
  Administered 2020-04-08 – 2020-04-09 (×6): 3 [IU] via SUBCUTANEOUS
  Filled 2020-04-06: qty 0.15

## 2020-04-06 MED ORDER — SODIUM CHLORIDE 0.9 % IV SOLN
2.0000 g | Freq: Three times a day (TID) | INTRAVENOUS | Status: DC
  Administered 2020-04-06 – 2020-04-09 (×7): 2 g via INTRAVENOUS
  Filled 2020-04-06 (×11): qty 2

## 2020-04-06 MED ORDER — ACETAMINOPHEN 650 MG RE SUPP: 650.0000 mg | Freq: Four times a day (QID) | RECTAL | Status: DC | PRN

## 2020-04-06 MED ORDER — FENTANYL CITRATE (PF) 100 MCG/2ML IJ SOLN
50.0000 ug | Freq: Once | INTRAMUSCULAR | Status: AC
  Administered 2020-04-06: 09:00:00 50 ug via INTRAVENOUS
  Filled 2020-04-06: qty 2

## 2020-04-06 MED ORDER — VANCOMYCIN HCL 1250 MG/250ML IV SOLN
1250.0000 mg | INTRAVENOUS | Status: DC
  Administered 2020-04-07 – 2020-04-09 (×3): 1250 mg via INTRAVENOUS
  Filled 2020-04-06 (×3): qty 250

## 2020-04-06 MED ORDER — ONDANSETRON HCL 4 MG/2ML IJ SOLN
4.0000 mg | Freq: Four times a day (QID) | INTRAMUSCULAR | Status: DC | PRN
  Administered 2020-04-06 – 2020-04-08 (×5): 4 mg via INTRAVENOUS
  Filled 2020-04-06 (×5): qty 2

## 2020-04-06 MED ORDER — INSULIN GLARGINE 100 UNIT/ML ~~LOC~~ SOLN
35.0000 [IU] | Freq: Every day | SUBCUTANEOUS | Status: DC
  Administered 2020-04-06 – 2020-04-08 (×3): 35 [IU] via SUBCUTANEOUS
  Filled 2020-04-06 (×5): qty 0.35

## 2020-04-06 MED ORDER — HYDRALAZINE HCL 20 MG/ML IJ SOLN
10.0000 mg | Freq: Three times a day (TID) | INTRAMUSCULAR | Status: DC | PRN
Start: 2020-04-06 — End: 2020-04-09
  Administered 2020-04-06 – 2020-04-08 (×3): 10 mg via INTRAVENOUS
  Filled 2020-04-06 (×3): qty 1

## 2020-04-06 MED ORDER — ENOXAPARIN SODIUM 40 MG/0.4ML ~~LOC~~ SOLN
40.0000 mg | SUBCUTANEOUS | Status: DC
  Administered 2020-04-06 – 2020-04-08 (×3): 40 mg via SUBCUTANEOUS
  Filled 2020-04-06 (×2): qty 0.4

## 2020-04-06 NOTE — Progress Notes (Addendum)
  Subjective:  Patient ID: Shirley [name], female    DOB: 12-05-1975,  MRN: [id number]  Patient seen in ED. Was sent yesterday to the ED by Dr. Jacqualyn Posey from the office. Patient still having severe pain to the right foot. MRI was ordered but was canceled as patient did not wish to remove her hair pins. Objective:   Vitals:   04/06/20 1830 04/06/20 1900  BP: (!) 165/89 (!) 168/88  Pulse: 88 88  Resp: 18 17  Temp:    SpO2: 100% 100%   Physical Exam  General: AAO x3, NAD  Dermatological: On the plantar aspect of the right foot on the midfoot area is what appears to be callus, puncture wound and there is what seems to be fluid, possible abscess underneath the skin.  There is tenderness palpation this area.  There is edema to the plantar musculature extending to the dorsal foot, leg.  Mild warmth of the foot.  Vascular: Dorsalis Pedis artery and Posterior Tibial artery pedal pulses are 2/4 bilateral with immedate capillary fill time.  There is no pain with calf compression, swelling, warmth, erythema.   Neruologic: Grossly intact via light touch bilateral.   Musculoskeletal: Tenderness the skin lesion plantar foot muscular strength 5/5 in all groups tested bilateral.  Gait: Unassisted, Nonantalgic.   Assessment & Plan:  Patient was evaluated and treated and all questions answered.  Cellulitis and Abscess right foot -MRI pending. Patient was agreeable to removing hair pins for MRI. -Continue empiric IV Abx. -Likely surgery for I and D if abscess noted on MRI. NPO after midnight. -Heel WBAT RLE -Will continue to follow.  Evelina Bucy, DPM  Best accessible via secure chat for questions or concerns.

## 2020-04-06 NOTE — ED Provider Notes (Addendum)
[facility] DEPT Provider Note   CSN: [id number] Arrival date & time: 04/05/20  2206     History Chief Complaint  Patient presents with  . Foot Swelling  . Abscess    Shirley [name] is a 44 y.o. female with a past medical history of IDDM, fibromyalgia, prior stroke, CAD, obesity presenting to the ED with a chief complaint of foot infection.  Approximately 2 months ago suffered from fracture of the fourth digit of her right foot.  She was seen and evaluated at urgent care about 10 days ago for a painful callus at the bottom of her right foot.  She was placed on doxycycline due to concern for infection.  She has been taking this medication as prescribed.  Yesterday she went to the podiatrist who was concerned about failure of antibiotics and possible abscess formation on the bottom of the right foot.  She reports extreme pain that is worse with ambulation.  She has swelling up until the calf area.  She denies any open wounds, shortness of breath, chest pain, fever, vomiting.  She has been taking tramadol for pain without much improvement.  HPI     Past Medical History:  Diagnosis Date  . Abscess of tunica vaginalis    10/09- Abundant S. aureus- sensitive to all abx  . Anxiety   . Blood dyscrasia   . CAD (coronary artery disease) 06/15/2006   s/p Subendocardial MI with PDA angioplasty(no stent) on 06/15/06 and relook  cath 06/19/06 showed patency of site. Cath 12/10- no restenosis or significant CAD progression  . CVA (cerebral vascular accident) ([place]) ~ 02/2014   denies residual on 04/22/2014  . CVA (cerebral vascular accident) [facility])    history of remote right cerebellar infarct noted on head CT at least since 10/2011  . Depression   . Diabetes mellitus type 2, uncontrolled, with complications ([place])   . Fibromyalgia   . Gastritis   . Gastroparesis    secondary to poorly controlled DM, last emptying study performed 01/2010  was normal but may be  falsely positive as pt was on reglan  . GERD (gastroesophageal reflux disease)   . Hepatitis B, chronic (HCC)    Hep BeAb+,Hep B cAb+ & Hep BsAg+ (9/06)  . History of pyelonephritis    H/o GrpB Pyelonephritis (9/06) and UTI- 07/11- E.Coli, 12/10- GBS  . Hyperlipidemia   . Hypertension   . Iron deficiency anemia   . Irregular menses    Small ovarian follicles seen on FY(1/01)  . MI (myocardial infarction) ([place]) 05/2006   PDA percutaneous transluminal coronary angioplasty  . Migraine    "weekly" (04/22/2014)  . N&V (nausea and vomiting)    Chronic. Unclear etiology with multiple admission and ED visits. CT abdomen with and without contrast (02/2011)  showed no acute process. Gastic Emptying scan (01/2010) was normal. Ultrasound of the abdomen was within normal limits. Hepatitis B viral load was undectable. HIV NR. EGD - gastritis, Hpylori + s/p Rx  . Obesity   . OSA (obstructive sleep apnea)    "suppose to wear mask but I don't" (04/22/2014)  . Peripheral neuropathy   . Pneumonia    "this is probably the 2nd or 3rd time I've had pneumonia" (04/22/2014)  . Recurrent boils   . Seasonal asthma   . Substance abuse ([place])   . Thrombocytosis    Hem/Onc suggested 2/2 chronic hepatits and/or iron deficiency anemia    Patient Active Problem List   Diagnosis Date Noted  .  Diabetic foot infection ([place]) 04/06/2020  . Sepsis due to pneumonia ([place]) 11/24/2019  . Thrombocytosis 11/24/2019  . Chronic pain 11/24/2019  . History of pulmonary embolism 10/10/2017  . Chronic anticoagulation 10/10/2017  . GERD (gastroesophageal reflux disease) 10/10/2017  . Leukocytosis 10/10/2017  . Prolonged QT interval 10/10/2017  . Nausea & vomiting 09/20/2017  . Hypertensive urgency 09/20/2017  . Intractable nausea and vomiting 08/08/2017  . Chronic maxillary sinusitis 01/02/2017  . Acute blood loss anemia 11/13/2016  . Menorrhagia 11/13/2016  . Bilateral pulmonary embolism ([place]) 10/30/2016  . Acute DVT  (deep venous thrombosis) ([place]) 10/30/2016  . Nausea and vomiting 09/23/2016  . Type 2 diabetes mellitus with hyperglycemia ([place]) 04/07/2016  . Diabetic gastroparesis ([place]) 04/06/2016  . Dehydration 03/27/2015  . Gastroparesis 11/11/2014  . Hematemesis 10/13/2014  . DKA (diabetic ketoacidoses) 10/13/2014  . Abdominal pain, chronic, left lower quadrant   . Diabetic gastroparesis associated with type 2 diabetes mellitus ([place]) 04/28/2014  . History of Helicobacter pylori infection 04/22/2014  . Vaginal discharge 02/18/2014  . Unspecified constipation 07/21/2013  . Tinea corporis 07/21/2013  . Intractable vomiting 04/29/2013  . Dysmenorrhea 04/22/2013  . UTI (urinary tract infection) 07/15/2012  . Headache(784.0) 02/08/2012  . Health care maintenance 01/22/2012  . Chronic hepatitis B ([place]) 03/07/2011  . History of leukocytosis 04/06/2010  . THROMBOCYTOSIS 04/06/2010  . Polysubstance abuse ([place]) 02/23/2010  . Iron deficiency anemia 11/22/2009  . PERIPHERAL NEUROPATHY 10/01/2009  . Hyperlipidemia 08/30/2009  . Diabetic polyneuropathy ([place]) 08/30/2009  . Hidradenitis (recurrent boils) 07/07/2008  . Depression 12/27/2007  . Abdominal pain, left lower quadrant 11/21/2007  . FIBROMYALGIA 10/30/2007  . BACK PAIN 04/01/2007  . OBSTRUCTIVE SLEEP APNEA 01/17/2007  . ANXIETY DEPRESSION 06/27/2006  . Chronic ischemic heart disease 06/15/2006  . OBESITY, MORBID 05/15/2006  . MIGRAINE HEADACHE 05/15/2006  . Asthma 05/15/2006  . Essential hypertension 01/16/2006  . IRREGULAR MENSTRUATION 01/16/2006  . PEDAL EDEMA 01/16/2006  . Poorly controlled type 2 diabetes mellitus with peripheral neuropathy ([place]) 01/16/1989    Past Surgical History:  Procedure Laterality Date  . CESAREAN SECTION  1997  . CORONARY ANGIOPLASTY WITH STENT PLACEMENT  2008   "2 stents"  . ESOPHAGOGASTRODUODENOSCOPY N/A 04/23/2014   Procedure: ESOPHAGOGASTRODUODENOSCOPY (EGD);  Surgeon: Winfield Cunas., MD;   [facility] ENDOSCOPY;  Service: Endoscopy;  Laterality: N/A;  . IR ANGIOGRAM PELVIS SELECTIVE OR SUPRASELECTIVE  12/07/2016  . IR ANGIOGRAM PELVIS SELECTIVE OR SUPRASELECTIVE  12/07/2016  . IR ANGIOGRAM SELECTIVE EACH ADDITIONAL VESSEL  12/07/2016  . IR ANGIOGRAM SELECTIVE EACH ADDITIONAL VESSEL  12/07/2016  . IR EMBO ARTERIAL NOT HEMORR HEMANG INC GUIDE ROADMAPPING  12/07/2016  . IR RADIOLOGIST EVAL & MGMT  01/09/2017  . IR US GUIDE VASC ACCESS RIGHT  12/07/2016     OB History    Gravida  2   Para  2   Term  2   Preterm      AB      Living        SAB      IAB      Ectopic      Multiple      Live Births  2           Family History  Problem Relation Age of Onset  . Diabetes Father   . Healthy Mother     Social History   Tobacco Use  . Smoking status: Former Smoker    Types: Cigarettes    Quit date: 04/24/1996  Years since quitting: 23.9  . Smokeless tobacco: Never Used  . Tobacco comment: quit smoking cigarettes age 57  Vaping Use  . Vaping Use: Never used  Substance Use Topics  . Alcohol use: No    Alcohol/week: 0.0 standard drinks    Comment: 04/22/2014 "might have a few drinks a month"  . Drug use: Not Currently    Types: Marijuana, Cocaine    Comment: 04/22/2104 "quit drugs ~ 1-2 yr ago"    Home Medications Prior to Admission medications   Medication Sig Start Date End Date Taking? Authorizing Provider  albuterol (VENTOLIN HFA) 108 (90 Base) MCG/ACT inhaler Inhale 2 puffs into the lungs every 4 (four) hours as needed for wheezing or shortness of breath. 11/30/19  Yes Domenic Polite, MD  amLODipine (NORVASC) 10 MG tablet Take 10 mg by mouth daily. 10/02/19  Yes [provider]  cyclobenzaprine (FLEXERIL) 5 MG tablet Take 5-10 mg by mouth 3 (three) times daily as needed for muscle spasms. 11/05/19  Yes [provider]  doxycycline (VIBRAMYCIN) 100 MG capsule Take 100 mg by mouth 2 (two) times daily. 03/29/20  Yes [provider]  HUMALOG KWIKPEN 100 UNIT/ML KwikPen Inject 7 Units into the skin 4 (four) times daily -  before meals and at bedtime. Hold if blood glucose is less than 110 11/11/19  Yes [provider]  insulin glargine (LANTUS SOLOSTAR) 100 UNIT/ML Solostar Pen Inject 30 Units into the skin daily. Patient taking differently: Inject 35 Units into the skin daily. 11/30/19  Yes Domenic Polite, MD  oxyCODONE-acetaminophen (PERCOCET) 10-325 MG tablet Take 1 tablet by mouth 4 (four) times daily as needed for pain. 11/05/19  Yes [provider]  blood glucose meter kit and supplies KIT Dispense based on patient and insurance preference. Use up to four times daily as directed. (FOR ICD-9 250.00, 250.01). 01/04/20   Rishawn Walck, PA-C  Blood Glucose Monitoring Suppl (ONETOUCH VERIO REFLECT) w/Device KIT 1 each by Does not apply route 3 (three) times daily. 12-28-2018   Kerin Perna, NP  gabapentin (NEURONTIN) 600 MG tablet Take 600 mg by mouth 2 (two) times daily. 11/05/19   [provider]  glimepiride (AMARYL) 4 MG tablet Take 4 mg by mouth daily. Patient not taking: Reported on 04/06/2020 11/05/19   [provider]  glucose blood (ONETOUCH VERIO) test strip Use as instructed to check blood sugar 3 times daily. Dx codes: E11.8, E11.65 2018/12/28   Charlott Rakes, MD  hydrOXYzine (ATARAX/VISTARIL) 50 MG tablet Take 25-50 mg by mouth at bedtime as needed for anxiety.  09/03/19   [provider]  Insulin Pen Needle (PEN NEEDLES 3/16") 31G X 5 MM MISC 1 Device by Does not apply route 4 (four) times daily - after meals and at bedtime. 11/28/18   Kerin Perna, NP  INSULIN SYRINGE .5CC/28G (INS SYRINGE/NEEDLE .5CC/28G) 28G X 1/2" 0.5 ML MISC Please provide 1 month supply 09/04/17   Colbert Ewing, MD  levocetirizine (XYZAL) 5 MG tablet Take 1 tablet (5 mg total) by mouth every evening. Patient not taking: Reported on 04/06/2020 11/28/18   Kerin Perna, NP   losartan-hydrochlorothiazide (HYZAAR) 100-25 MG tablet Take 1 tablet by mouth daily. 11/28/18   Kerin Perna, NP  ondansetron (ZOFRAN ODT) 4 MG disintegrating tablet Take 1 tablet (4 mg total) by mouth every 8 (eight) hours as needed for nausea or vomiting. 01/04/20   Raphaella Larkin, PA-C  OneTouch Delica Lancets 16X MISC Use to check blood  sugar 3 times daily. Dx codes: E11.8, E11.65 2018-12-10   Charlott Rakes, MD  pravastatin (PRAVACHOL) 80 MG tablet Take 80 mg by mouth daily. 11/05/19   [provider]  sitaGLIPtin (JANUVIA) 100 MG tablet Take 1 tablet (100 mg total) by mouth daily. 11/28/18   Kerin Perna, NP  traMADol (ULTRAM) 50 MG tablet Take 1 tablet (50 mg total) by mouth every 6 (six) hours as needed. 03/27/20   Volney American, PA-C  traZODone (DESYREL) 50 MG tablet Take 0.5-1 tablets (25-50 mg total) by mouth at bedtime as needed for sleep. 11/28/18   Kerin Perna, NP  TRULICITY 4.5 AU/6.3FH SOPN Inject 4.5 mg into the skin once a week. 11/11/19   [provider]  Vitamin D, Ergocalciferol, (DRISDOL) 1.25 MG (50000 UNIT) CAPS capsule Take 50,000 Units by mouth once a week. 11/11/19   [provider]    Allergies    Lisinopril and Morphine and related  Review of Systems   Review of Systems  Constitutional: Negative for appetite change, chills and fever.  HENT: Negative for ear pain, rhinorrhea, sneezing and sore throat.   Eyes: Negative for photophobia and visual disturbance.  Respiratory: Negative for cough, chest tightness, shortness of breath and wheezing.   Cardiovascular: Positive for leg swelling. Negative for chest pain and palpitations.  Gastrointestinal: Negative for abdominal pain, blood in stool, constipation, diarrhea, nausea and vomiting.  Genitourinary: Negative for dysuria, hematuria and urgency.  Musculoskeletal: Negative for myalgias.  Skin: Positive for wound. Negative for rash.  Neurological: Negative for dizziness,  weakness and light-headedness.    Physical Exam Updated Vital Signs BP (!) 187/107   Pulse 92   Temp 98.2 F (36.8 C) (Oral)   Resp 18   Ht 5' 7"  (1.702 m)   Wt 93.9 kg   LMP 03/21/2020   SpO2 98%   BMI 32.42 kg/m   Physical Exam Vitals and nursing note reviewed.  Constitutional:      General: She is not in acute distress.    Appearance: She is well-developed and well-nourished.  HENT:     Head: Normocephalic and atraumatic.     Nose: Nose normal.  Eyes:     General: No scleral icterus.       Left eye: No discharge.     Extraocular Movements: EOM normal.     Conjunctiva/sclera: Conjunctivae normal.  Cardiovascular:     Rate and Rhythm: Normal rate and regular rhythm.     Pulses: Intact distal pulses.     Heart sounds: Normal heart sounds. No murmur heard. No friction rub. No gallop.   Pulmonary:     Effort: Pulmonary effort is normal. No respiratory distress.     Breath sounds: Normal breath sounds.  Abdominal:     General: Bowel sounds are normal. There is no distension.     Palpations: Abdomen is soft.     Tenderness: There is no abdominal tenderness. There is no guarding.  Musculoskeletal:        General: No edema. Normal range of motion.     Cervical back: Normal range of motion and neck supple.  Skin:    General: Skin is warm and dry.     Findings: Erythema present. No rash.     Comments: Diffuse edema of the right foot with tenderness to palpation.  2+ DP pulse palpated.  No open wounds or drainage noted.  Neurological:     Mental Status: She is alert.     Motor:  No abnormal muscle tone.     Coordination: Coordination normal.  Psychiatric:        Mood and Affect: Mood and affect normal.         ED Results / Procedures / Treatments   Labs (all labs ordered are listed, but only abnormal results are displayed) Labs Reviewed  CBC WITH DIFFERENTIAL/PLATELET - Abnormal; Notable for the following components:      Result Value   WBC 14.1 (*)     Hemoglobin 11.9 (*)    HCT 34.6 (*)    MCV 79.4 (*)    Neutro Abs 9.6 (*)    All other components within normal limits  COMPREHENSIVE METABOLIC PANEL - Abnormal; Notable for the following components:   Sodium 132 (*)    Glucose, Bld 359 (*)    Creatinine, Ser 1.30 (*)    Calcium 8.7 (*)    Albumin 2.7 (*)    GFR, Estimated 51 (*)    All other components within normal limits  CULTURE, BLOOD (ROUTINE X 2)  CULTURE, BLOOD (ROUTINE X 2)  RESP PANEL BY RT-PCR (FLU A&B, COVID) [id number]  LACTIC ACID, PLASMA  LACTIC ACID, PLASMA    EKG None  Radiology DG Foot Complete Right  Result Date: 04/06/2020 CLINICAL DATA:  Soft tissue infection EXAM: RIGHT FOOT COMPLETE - 3+ VIEW COMPARISON:  None. FINDINGS: Generalized soft tissue swelling is noted about the metatarsals consistent with the given clinical history. No bony erosive changes are seen. No fracture or dislocation is noted. Small calcaneal spurs are noted. IMPRESSION: Soft tissue swelling consistent with the given clinical history. No acute bony abnormality is noted. Electronically Signed   By: Inez Catalina M.D.   On: 04/06/2020 08:01    Procedures Procedures (including critical care time)  Medications Ordered in ED Medications  vancomycin (VANCOREADY) IVPB 1750 mg/350 mL (has no administration in time range)  fentaNYL (SUBLIMAZE) injection 50 mcg (50 mcg Intravenous Given 04/06/20 0857)    ED Course  I have reviewed the triage vital signs and the nursing notes.  Pertinent labs & imaging results that were available during my care of the patient were reviewed by me and considered in my medical decision making (see chart for details).  Clinical Course as of 04/06/20 0859  Tue Apr 06, 2020  0655 WBC(!): 14.1 [HK]    Clinical Course User Index [HK] Delia Heady, PA-C   MDM Rules/Calculators/A&P                          44 year old female with a past medical history of IDDM, fibromyalgia, prior stroke, obesity presenting to  the ED with a chief complaint of foot infection.  2 months ago suffered from a fracture of the fourth digit of her right foot.  She had a callus form about 2 to 3 weeks ago which has gradually progressed.  She was seen by podiatry yesterday and there was concern for failed outpatient antibiotics that she has been on doxycycline for the past 7 to 8 days.  She has significant swelling of the foot up to the calf with concern for infection at the bottom of her foot.  Please see images above.  She reports pain that is worse with ambulation.  Denies any systemic complaints.  Work-up here significant for leukocytosis of 14.  She is afebrile without recent use of antipyretics.  X-ray shows soft tissue swelling without bony abnormality. Patient will be admitted to medicine service.  I have ordered pain control and IV antibiotics.  8:42 AM I called her podiatrist office to ensure that they will follow patient during admission for procedure and for further recommendations as far as imaging.  9:00 AM Spoke to Dr. Jacqualyn Posey, her podiatrist.  They will follow along and recommend MRI of the foot with contrast. I have ordered this.  10:43 AM MRI tech called to let me know that the patient refused to take her Bobby pins out of her hair prior to getting MRI done and refused the imaging study.  Portions of this note were generated with Lobbyist. Dictation errors may occur despite best attempts at proofreading.  Final Clinical Impression(s) / ED Diagnoses Final diagnoses:  Diabetic foot infection [facility])    Rx / DC Orders ED Discharge Orders    None       Delia Heady, PA-C 04/06/20 1043    Breck Coons, MD 04/06/20 1351

## 2020-04-06 NOTE — Telephone Encounter (Signed)
Patient has been admitted to [facility] working Dr.Katz, wants to speak with you directly regarding follow up with patient and inquired about imaging for patient as well   Provider Contact [phone] [facility])  Please advise

## 2020-04-06 NOTE — Progress Notes (Signed)
Second failed attempt at MRI. Pt still has hair pins and body jewelry which need to be removed prior to MRI. Delay in pt care. Pt requesting to finishing eating before attempting MRI. Spoke with RN Clarise Cruz) via secure chat.

## 2020-04-06 NOTE — Progress Notes (Signed)
Pharmacy Antibiotic Note  Shirley [name] is a 44 y.o. female admitted on 04/06/2020 with diabetic foot infection.  Pharmacy has been consulted for vancomycin & cefepime dosing.  Plan: Vancomycin 1750 mg IV x 1 loading dose Vancomycin 1250 IV every 24 hours.  Goal trough 15-20 mcg/mL.  AUC 554 Css max/min 38.9/13.2 using SCr 1.4 & Vd 0.5 Cefepime 2 gm IV q8h  Daily SCr F/u renal function, WBC, temp, podiatry plans Vancomycin trough as needed  Height: 5\' 7"  (170.2 cm) Weight: 93.9 kg (207 lb) IBW/kg (Calculated) : 61.6  Temp (24hrs), Avg:98.4 F (36.9 C), Min:98.2 F (36.8 C), Max:98.6 F (37 C)  Recent Labs  Lab 04/05/20 2246 04/06/20 0858  WBC 14.1*  --   CREATININE 1.30*  --   LATICACIDVEN  --  0.9    Estimated Creatinine Clearance: 63.6 mL/min (A) (by C-G formula based on SCr of 1.3 mg/dL (H)).    Allergies  Allergen Reactions  . Lisinopril Nausea Only and Swelling  . Morphine And Related Itching and Swelling    Antimicrobials this admission: 12/14 vanc> 12/14 cefepime>> Dose adjustments this admission:  Microbiology results: 12/14 BCx2: sent 12/14 covid/flu neg  Thank you for allowing pharmacy to be a part of this patient's care.  Eudelia Bunch, Pharm.D 04/06/2020 10:58 AM

## 2020-04-06 NOTE — Progress Notes (Signed)
A consult was received from an ED physician for Vancomycin per pharmacy dosing.  [name] profile has been reviewed for ht/wt/allergies/indication/available labs.   A one time order has been placed for Vancomycin 1750 mg.  Further antibiotics/pharmacy consults should be ordered by admitting physician if indicated.                       Thank you,  Gretta Arab PharmD, BCPS Clinical Pharmacist [facility] main pharmacy [phone] 04/06/2020 8:28 AM

## 2020-04-06 NOTE — Telephone Encounter (Signed)
I called and spoke with them. Recommended an MRI and someone will be be to see her today.

## 2020-04-06 NOTE — Progress Notes (Signed)
Pt refusing MRI at this time. Pt states she will not take hair pins out which is required for MRI safety. Clinical education was provided to the pt about we will be unable to preform your MRI and its importance in her care. Pt still refused. PA notified.

## 2020-04-06 NOTE — Progress Notes (Signed)
Orthopedic Tech Progress Note Patient Details:  Shirley [name] 1975-11-16 [id number]  Ortho Devices Ortho Device/Splint Location: applied post op shoe to RLE Ortho Device/Splint Interventions: Ordered,Application   Post Interventions Patient Tolerated: Well Instructions Provided: Care of device   Braulio Bosch 04/06/2020, 9:27 PM

## 2020-04-06 NOTE — H&P (Addendum)
History and Physical    Shirley [name] PVV:[id number] DOB: Dec 16, 1975 DOA: 04/06/2020  PCP: Fredrich Romans, PA  Patient coming from: Home  Chief Complaint: Foot pain  HPI: Shirley [name] is a 44 y.o. female with medical history significant of DM2, HTN. Presenting with right foot pain and swelling that started 2.5 weeks ago. She initially thought it was just painful calloses, but she had progressive swelling and discoloration of the bottom of her foot to her ankles over the next couple of weeks. She treat percocet for the pain, but it did not help. She denies fevers, drainage. She saw her podiatrist yesterday and was given the recommendation to come to the hospital.    ED Course: XR showed no signs of osteo. Podiatry recommended MRI foot. She was started on vanc. [facility] was called for admission.   Review of Systems:  Denies CP, dyspnea, HA, ab pain. Review of systems is otherwise negative for all not mentioned in HPI.   PMHx Past Medical History:  Diagnosis Date  . Abscess of tunica vaginalis    10/09- Abundant S. aureus- sensitive to all abx  . Anxiety   . Blood dyscrasia   . CAD (coronary artery disease) 06/15/2006   s/p Subendocardial MI with PDA angioplasty(no stent) on 06/15/06 and relook  cath 06/19/06 showed patency of site. Cath 12/10- no restenosis or significant CAD progression  . CVA (cerebral vascular accident) ([place]) ~ 02/2014   denies residual on 04/22/2014  . CVA (cerebral vascular accident) [facility])    history of remote right cerebellar infarct noted on head CT at least since 10/2011  . Depression   . Diabetes mellitus type 2, uncontrolled, with complications ([place])   . Fibromyalgia   . Gastritis   . Gastroparesis    secondary to poorly controlled DM, last emptying study performed 01/2010  was normal but may be falsely positive as pt was on reglan  . GERD (gastroesophageal reflux disease)   . Hepatitis B, chronic (HCC)    Hep BeAb+,Hep B cAb+ & Hep BsAg+ (9/06)  . History  of pyelonephritis    H/o GrpB Pyelonephritis (9/06) and UTI- 07/11- E.Coli, 12/10- GBS  . Hyperlipidemia   . Hypertension   . Iron deficiency anemia   . Irregular menses    Small ovarian follicles seen on LJ(4/49)  . MI (myocardial infarction) ([place]) 05/2006   PDA percutaneous transluminal coronary angioplasty  . Migraine    "weekly" (04/22/2014)  . N&V (nausea and vomiting)    Chronic. Unclear etiology with multiple admission and ED visits. CT abdomen with and without contrast (02/2011)  showed no acute process. Gastic Emptying scan (01/2010) was normal. Ultrasound of the abdomen was within normal limits. Hepatitis B viral load was undectable. HIV NR. EGD - gastritis, Hpylori + s/p Rx  . Obesity   . OSA (obstructive sleep apnea)    "suppose to wear mask but I don't" (04/22/2014)  . Peripheral neuropathy   . Pneumonia    "this is probably the 2nd or 3rd time I've had pneumonia" (04/22/2014)  . Recurrent boils   . Seasonal asthma   . Substance abuse ([place])   . Thrombocytosis    Hem/Onc suggested 2/2 chronic hepatits and/or iron deficiency anemia    PSHx Past Surgical History:  Procedure Laterality Date  . CESAREAN SECTION  1997  . CORONARY ANGIOPLASTY WITH STENT PLACEMENT  2008   "2 stents"  . ESOPHAGOGASTRODUODENOSCOPY N/A 04/23/2014   Procedure: ESOPHAGOGASTRODUODENOSCOPY (EGD);  Surgeon: Winfield Cunas.,  MD;  Location: [facility];  Service: Endoscopy;  Laterality: N/A;  . IR ANGIOGRAM PELVIS SELECTIVE OR SUPRASELECTIVE  12/07/2016  . IR ANGIOGRAM PELVIS SELECTIVE OR SUPRASELECTIVE  12/07/2016  . IR ANGIOGRAM SELECTIVE EACH ADDITIONAL VESSEL  12/07/2016  . IR ANGIOGRAM SELECTIVE EACH ADDITIONAL VESSEL  12/07/2016  . IR EMBO ARTERIAL NOT HEMORR HEMANG INC GUIDE ROADMAPPING  12/07/2016  . IR RADIOLOGIST EVAL & MGMT  01/09/2017  . IR US GUIDE VASC ACCESS RIGHT  12/07/2016    SocHx  reports that she quit smoking about 23 years ago. Her smoking use included cigarettes. She has  never used smokeless tobacco. She reports previous drug use. Drugs: Marijuana and Cocaine. She reports that she does not drink alcohol.  Allergies  Allergen Reactions  . Lisinopril Nausea Only and Swelling  . Morphine And Related Itching and Swelling    FamHx Family History  Problem Relation Age of Onset  . Diabetes Father   . Healthy Mother     Prior to Admission medications   Medication Sig Start Date End Date Taking? Authorizing Provider  albuterol (VENTOLIN HFA) 108 (90 Base) MCG/ACT inhaler Inhale 2 puffs into the lungs every 4 (four) hours as needed for wheezing or shortness of breath. 11/30/19  Yes Domenic Polite, MD  amLODipine (NORVASC) 10 MG tablet Take 10 mg by mouth daily. 10/02/19  Yes [provider]  cyclobenzaprine (FLEXERIL) 5 MG tablet Take 5-10 mg by mouth 3 (three) times daily as needed for muscle spasms. 11/05/19  Yes [provider]  doxycycline (VIBRAMYCIN) 100 MG capsule Take 100 mg by mouth 2 (two) times daily. 03/29/20  Yes [provider]  HUMALOG KWIKPEN 100 UNIT/ML KwikPen Inject 7 Units into the skin 4 (four) times daily -  before meals and at bedtime. Hold if blood glucose is less than 110 11/11/19  Yes [provider]  insulin glargine (LANTUS SOLOSTAR) 100 UNIT/ML Solostar Pen Inject 30 Units into the skin daily. Patient taking differently: Inject 35 Units into the skin daily. 11/30/19  Yes Domenic Polite, MD  oxyCODONE-acetaminophen (PERCOCET) 10-325 MG tablet Take 1 tablet by mouth 4 (four) times daily as needed for pain. 11/05/19  Yes [provider]  blood glucose meter kit and supplies KIT Dispense based on patient and insurance preference. Use up to four times daily as directed. (FOR ICD-9 250.00, 250.01). 01/04/20   Khatri, Hina, PA-C  Blood Glucose Monitoring Suppl (ONETOUCH VERIO REFLECT) w/Device KIT 1 each by Does not apply route 3 (three) times daily. 03-Jan-2019   Kerin Perna, NP  glucose blood (ONETOUCH  VERIO) test strip Use as instructed to check blood sugar 3 times daily. Dx codes: E11.8, E11.65 01/03/2019   Charlott Rakes, MD  Insulin Pen Needle (PEN NEEDLES 3/16") 31G X 5 MM MISC 1 Device by Does not apply route 4 (four) times daily - after meals and at bedtime. 11/28/18   Kerin Perna, NP  INSULIN SYRINGE .5CC/28G (INS SYRINGE/NEEDLE .5CC/28G) 28G X 1/2" 0.5 ML MISC Please provide 1 month supply 09/04/17   Colbert Ewing, MD  levocetirizine (XYZAL) 5 MG tablet Take 1 tablet (5 mg total) by mouth every evening. Patient not taking: No sig reported 11/28/18   Kerin Perna, NP  losartan-hydrochlorothiazide (HYZAAR) 100-25 MG tablet Take 1 tablet by mouth daily. Patient not taking: No sig reported 11/28/18   Kerin Perna, NP  ondansetron (ZOFRAN ODT) 4 MG disintegrating tablet Take 1 tablet (4 mg total) by mouth every 8 (eight)  hours as needed for nausea or vomiting. Patient not taking: No sig reported 01/04/20   Khatri, Nicanor Alcon, PA-C  OneTouch Delica Lancets 63F MISC Use to check blood sugar 3 times daily. Dx codes: E11.8, E11.65 December 08, 2018   Charlott Rakes, MD  pravastatin (PRAVACHOL) 80 MG tablet Take 80 mg by mouth daily. Patient not taking: No sig reported 11/05/19   [provider]  sitaGLIPtin (JANUVIA) 100 MG tablet Take 1 tablet (100 mg total) by mouth daily. Patient not taking: No sig reported 11/28/18   Kerin Perna, NP  traMADol (ULTRAM) 50 MG tablet Take 1 tablet (50 mg total) by mouth every 6 (six) hours as needed. Patient not taking: No sig reported 03/27/20   Volney American, PA-C  traZODone (DESYREL) 50 MG tablet Take 0.5-1 tablets (25-50 mg total) by mouth at bedtime as needed for sleep. Patient not taking: No sig reported 11/28/18   Kerin Perna, NP  TRULICITY 4.5 HL/4.5GY SOPN Inject 4.5 mg into the skin once a week. Patient not taking: No sig reported 11/11/19   [provider]    Physical Exam: Vitals:   04/06/20 0554 04/06/20 0700  04/06/20 0745 04/06/20 0800  BP: (!) 163/102 (!) 163/87 (!) 187/107 (!) 187/107  Pulse: (!) 102 86 92 92  Resp: 16 18  18   Temp: 98.2 F (36.8 C)     TempSrc: Oral     SpO2: 99% 97% 98% 98%  Weight:      Height:        General: 44 y.o. female resting in bed in NAD Eyes: PERRL, normal sclera ENMT: Nares patent w/o discharge, orophaynx clear, dentition normal, ears w/o discharge/lesions/ulcers Neck: Supple, trachea midline Cardiovascular: RRR, +S1, S2, no m/g/r, equal pulses throughout Respiratory: CTABL, no w/r/r, normal WOB GI: BS+, NDNT, no masses noted, no organomegaly noted MSK: No c/c; RLE edema, TTP Skin: No rashes, bruises, ulcerations noted Neuro: A&O x 3, no focal deficits Psyc: Appropriate interaction and affect, calm/cooperative  Labs on Admission: I have personally reviewed following labs and imaging studies  CBC: Recent Labs  Lab 04/05/20 2246  WBC 14.1*  NEUTROABS 9.6*  HGB 11.9*  HCT 34.6*  MCV 79.4*  PLT 563   Basic Metabolic Panel: Recent Labs  Lab 04/05/20 2246  NA 132*  K 3.8  CL 101  CO2 24  GLUCOSE 359*  BUN 18  CREATININE 1.30*  CALCIUM 8.7*   GFR: Estimated Creatinine Clearance: 63.6 mL/min (A) (by C-G formula based on SCr of 1.3 mg/dL (H)). Liver Function Tests: Recent Labs  Lab 04/05/20 2246  AST 15  ALT 8  ALKPHOS 61  BILITOT 0.3  PROT 7.1  ALBUMIN 2.7*   No results for input(s): LIPASE, AMYLASE in the last 168 hours. No results for input(s): AMMONIA in the last 168 hours. Coagulation Profile: No results for input(s): INR, PROTIME in the last 168 hours. Cardiac Enzymes: No results for input(s): CKTOTAL, CKMB, CKMBINDEX, TROPONINI in the last 168 hours. BNP (last 3 results) No results for input(s): PROBNP in the last 8760 hours. HbA1C: No results for input(s): HGBA1C in the last 72 hours. CBG: No results for input(s): GLUCAP in the last 168 hours. Lipid Profile: No results for input(s): CHOL, HDL, LDLCALC, TRIG,  CHOLHDL, LDLDIRECT in the last 72 hours. Thyroid Function Tests: No results for input(s): TSH, T4TOTAL, FREET4, T3FREE, THYROIDAB in the last 72 hours. Anemia Panel: No results for input(s): VITAMINB12, FOLATE, FERRITIN, TIBC, IRON, RETICCTPCT in the last 72 hours.  Urine analysis:    Component Value Date/Time   COLORURINE YELLOW 01/03/2020 2045   APPEARANCEUR TURBID (A) 01/03/2020 2045   LABSPEC 1.025 01/03/2020 2045   PHURINE 6.5 01/03/2020 2045   GLUCOSEU >=500 (A) 01/03/2020 2045   GLUCOSEU > 1000 mg/dL (A) 07/07/2008 2115   HGBUR SMALL (A) 01/03/2020 2045   HGBUR negative 08/12/2008 1444   BILIRUBINUR SMALL (A) 01/03/2020 2045   BILIRUBINUR neg 02/07/2017 1609   KETONESUR 15 (A) 01/03/2020 2045   PROTEINUR >300 (A) 01/03/2020 2045   UROBILINOGEN 1.0 02/07/2017 1609   UROBILINOGEN 0.2 11/11/2014 1658   NITRITE NEGATIVE 01/03/2020 2045   LEUKOCYTESUR NEGATIVE 01/03/2020 2045    Radiological Exams on Admission: DG Foot Complete Right  Result Date: 04/06/2020 CLINICAL DATA:  Soft tissue infection EXAM: RIGHT FOOT COMPLETE - 3+ VIEW COMPARISON:  None. FINDINGS: Generalized soft tissue swelling is noted about the metatarsals consistent with the given clinical history. No bony erosive changes are seen. No fracture or dislocation is noted. Small calcaneal spurs are noted. IMPRESSION: Soft tissue swelling consistent with the given clinical history. No acute bony abnormality is noted. Electronically Signed   By: Inez Catalina M.D.   On: 04/06/2020 08:01   Assessment/Plan DM Foot infection     - admit to inpt, med-surg     - vanc, cefepime, pharm consult     - podiatry to see, MRI foot ordered     - check lactic acid  DM2     - resume home lantus     - SSI, DM2 diet, A1c, glucose checks  HTN     - resume home amlodipine     - PRN hydralazine  CKD3b     - Scr is at baseline     - watch nephrotoxins, follow  Hyponatremia     - mild, follow  Microcytic anemia     - check  iron panel  DVT prophylaxis: lovenox  Code Status: FULL  Family Communication: None at bedside  Consults called: EDP spoke with podiatry   Status is: Inpatient  Remains inpatient appropriate because:Inpatient level of care appropriate due to severity of illness   Dispo: The patient is from: Home              Anticipated d/c is to: Home              Anticipated d/c date is: 3 days              Patient currently is not medically stable to d/c.  Jonnie Finner DO [facility]  If 7PM-7AM, please contact night-coverage [email or web address]  04/06/2020, 9:05 AM

## 2020-04-07 ENCOUNTER — Inpatient Hospital Stay (HOSPITAL_COMMUNITY): Payer: Medicare Other | Admitting: Anesthesiology

## 2020-04-07 ENCOUNTER — Encounter (HOSPITAL_COMMUNITY): Payer: Self-pay | Admitting: Internal Medicine

## 2020-04-07 ENCOUNTER — Ambulatory Visit: Payer: Medicare Other | Admitting: Endocrinology

## 2020-04-07 ENCOUNTER — Encounter (HOSPITAL_COMMUNITY)
Admission: EM | Disposition: A | Discharge status: Home Health | Payer: Self-pay | Source: Ambulatory Visit | Attending: Internal Medicine

## 2020-04-07 DIAGNOSIS — M71071 Abscess of bursa, right ankle and foot: Secondary | ICD-10-CM

## 2020-04-07 HISTORY — PX: IRRIGATION AND DEBRIDEMENT FOOT: SHX6602

## 2020-04-07 LAB — COMPREHENSIVE METABOLIC PANEL
ALT: 7 U/L (ref 0–44)
ALT: 6 U/L (ref 0–44)
AST: 9 U/L — ABNORMAL LOW (ref 15–41)
AST: 12 U/L — ABNORMAL LOW (ref 15–41)
Albumin: 2.4 g/dL — ABNORMAL LOW (ref 3.5–5.0)
Albumin: 2.4 g/dL — ABNORMAL LOW (ref 3.5–5.0)
Alkaline Phosphatase: 60 U/L (ref 38–126)
Alkaline Phosphatase: 52 U/L (ref 38–126)
Anion gap: 8 (ref 5–15)
Anion gap: 9 (ref 5–15)
BUN: 20 mg/dL (ref 6–20)
BUN: 20 mg/dL (ref 6–20)
CO2: 25 mmol/L (ref 22–32)
CO2: 25 mmol/L (ref 22–32)
Calcium: 8.5 mg/dL — ABNORMAL LOW (ref 8.9–10.3)
Calcium: 8.4 mg/dL — ABNORMAL LOW (ref 8.9–10.3)
Chloride: 103 mmol/L (ref 98–111)
Chloride: 105 mmol/L (ref 98–111)
Creatinine, Ser: 1.26 mg/dL — ABNORMAL HIGH (ref 0.44–1.00)
Creatinine, Ser: 1.21 mg/dL — ABNORMAL HIGH (ref 0.44–1.00)
GFR, Estimated: 53 mL/min — ABNORMAL LOW (ref >60)
GFR, Estimated: 56 mL/min — ABNORMAL LOW (ref >60)
Glucose, Bld: 299 mg/dL — ABNORMAL HIGH (ref 70–99)
Glucose, Bld: 147 mg/dL — ABNORMAL HIGH (ref 70–99)
Potassium: 3.6 mmol/L (ref 3.5–5.1)
Potassium: 4 mmol/L (ref 3.5–5.1)
Sodium: 136 mmol/L (ref 135–145)
Sodium: 139 mmol/L (ref 135–145)
Total Bilirubin: 0.3 mg/dL (ref 0.3–1.2)
Total Bilirubin: 0.2 mg/dL — ABNORMAL LOW (ref 0.3–1.2)
Total Protein: 6.6 g/dL (ref 6.5–8.1)
Total Protein: 6.3 g/dL — ABNORMAL LOW (ref 6.5–8.1)

## 2020-04-07 LAB — CBC
HCT: 33.3 % — ABNORMAL LOW (ref 36.0–46.0)
Hemoglobin: 11.6 g/dL — ABNORMAL LOW (ref 12.0–15.0)
MCH: 27.6 pg (ref 26.0–34.0)
MCHC: 34.8 g/dL (ref 30.0–36.0)
MCV: 79.3 fL — ABNORMAL LOW (ref 80.0–100.0)
Platelets: 408 10*3/uL — ABNORMAL HIGH (ref 150–400)
RBC: 4.2 MIL/uL (ref 3.87–5.11)
RDW: 14 % (ref 11.5–15.5)
WBC: 11.7 10*3/uL — ABNORMAL HIGH (ref 4.0–10.5)
nRBC: 0 % (ref 0.0–0.2)

## 2020-04-07 LAB — CBC WITH DIFFERENTIAL/PLATELET
Abs Immature Granulocytes: 0.03 10*3/uL (ref 0.00–0.07)
Basophils Absolute: 0.1 10*3/uL (ref 0.0–0.1)
Basophils Relative: 1 %
Eosinophils Absolute: 0.3 10*3/uL (ref 0.0–0.5)
Eosinophils Relative: 3 %
HCT: 34 % — ABNORMAL LOW (ref 36.0–46.0)
Hemoglobin: 11.8 g/dL — ABNORMAL LOW (ref 12.0–15.0)
Immature Granulocytes: 0 %
Lymphocytes Relative: 31 %
Lymphs Abs: 3.8 10*3/uL (ref 0.7–4.0)
MCH: 27.7 pg (ref 26.0–34.0)
MCHC: 34.7 g/dL (ref 30.0–36.0)
MCV: 79.8 fL — ABNORMAL LOW (ref 80.0–100.0)
Monocytes Absolute: 0.8 10*3/uL (ref 0.1–1.0)
Monocytes Relative: 7 %
Neutro Abs: 7.1 10*3/uL (ref 1.7–7.7)
Neutrophils Relative %: 58 %
Platelets: 408 10*3/uL — ABNORMAL HIGH (ref 150–400)
RBC: 4.26 MIL/uL (ref 3.87–5.11)
RDW: 14 % (ref 11.5–15.5)
WBC: 12.1 10*3/uL — ABNORMAL HIGH (ref 4.0–10.5)
nRBC: 0 % (ref 0.0–0.2)

## 2020-04-07 LAB — GLUCOSE, CAPILLARY
Glucose-Capillary: 123 mg/dL — ABNORMAL HIGH (ref 70–99)
Glucose-Capillary: 225 mg/dL — ABNORMAL HIGH (ref 70–99)
Glucose-Capillary: 98 mg/dL (ref 70–99)
Glucose-Capillary: 84 mg/dL (ref 70–99)
Glucose-Capillary: 91 mg/dL (ref 70–99)
Glucose-Capillary: 218 mg/dL — ABNORMAL HIGH (ref 70–99)

## 2020-04-07 LAB — HEMOGLOBIN A1C
Hgb A1c MFr Bld: 12.3 % — ABNORMAL HIGH (ref 4.8–5.6)
Mean Plasma Glucose: 306.31 mg/dL

## 2020-04-07 LAB — MRSA PCR SCREENING: MRSA by PCR: POSITIVE — AB

## 2020-04-07 LAB — LACTIC ACID, PLASMA
Lactic Acid, Venous: 2.3 mmol/L (ref 0.5–1.9)
Lactic Acid, Venous: 0.7 mmol/L (ref 0.5–1.9)
Lactic Acid, Venous: 1.5 mmol/L (ref 0.5–1.9)

## 2020-04-07 LAB — MAGNESIUM: Magnesium: 2 mg/dL (ref 1.7–2.4)

## 2020-04-07 SURGERY — IRRIGATION AND DEBRIDEMENT FOOT
Anesthesia: General | Site: Foot | Laterality: Right

## 2020-04-07 MED ORDER — OXYCODONE HCL 5 MG PO TABS: 5.0000 mg | ORAL_TABLET | Freq: Once | ORAL | Status: DC | PRN

## 2020-04-07 MED ORDER — LACTATED RINGERS IV SOLN: INTRAVENOUS | Status: DC

## 2020-04-07 MED ORDER — FENTANYL CITRATE (PF) 100 MCG/2ML IJ SOLN
INTRAMUSCULAR | Status: AC
  Filled 2020-04-07: qty 2

## 2020-04-07 MED ORDER — PROCHLORPERAZINE EDISYLATE 10 MG/2ML IJ SOLN
10.0000 mg | Freq: Four times a day (QID) | INTRAMUSCULAR | Status: DC | PRN
  Administered 2020-04-07 – 2020-04-09 (×3): 10 mg via INTRAVENOUS
  Filled 2020-04-07 (×3): qty 2

## 2020-04-07 MED ORDER — PROPOFOL 10 MG/ML IV BOLUS
INTRAVENOUS | Status: AC
  Filled 2020-04-07: qty 20

## 2020-04-07 MED ORDER — SUCCINYLCHOLINE CHLORIDE 200 MG/10ML IV SOSY
PREFILLED_SYRINGE | INTRAVENOUS | Status: DC | PRN
  Administered 2020-04-07: 100 mg via INTRAVENOUS

## 2020-04-07 MED ORDER — BUPIVACAINE HCL (PF) 0.5 % IJ SOLN
INTRAMUSCULAR | Status: AC
  Filled 2020-04-07: qty 30

## 2020-04-07 MED ORDER — PROMETHAZINE HCL 25 MG/ML IJ SOLN: 6.2500 mg | INTRAMUSCULAR | Status: DC | PRN

## 2020-04-07 MED ORDER — LIDOCAINE HCL (CARDIAC) PF 100 MG/5ML IV SOSY
PREFILLED_SYRINGE | INTRAVENOUS | Status: DC | PRN
  Administered 2020-04-07: 40 mg via INTRAVENOUS

## 2020-04-07 MED ORDER — ONDANSETRON HCL 4 MG/2ML IJ SOLN
INTRAMUSCULAR | Status: AC
  Filled 2020-04-07: qty 2

## 2020-04-07 MED ORDER — PROPOFOL 10 MG/ML IV BOLUS
INTRAVENOUS | Status: DC | PRN
  Administered 2020-04-07: 200 mg via INTRAVENOUS

## 2020-04-07 MED ORDER — CHLORHEXIDINE GLUCONATE 0.12 % MT SOLN
15.0000 mL | Freq: Once | OROMUCOSAL | Status: AC
  Administered 2020-04-07: 19:00:00 15 mL via OROMUCOSAL

## 2020-04-07 MED ORDER — BUPIVACAINE HCL 0.5 % IJ SOLN
INTRAMUSCULAR | Status: DC | PRN
  Administered 2020-04-07: 20 mL

## 2020-04-07 MED ORDER — ONDANSETRON HCL 4 MG/2ML IJ SOLN
INTRAMUSCULAR | Status: DC | PRN
  Administered 2020-04-07: 4 mg via INTRAVENOUS

## 2020-04-07 MED ORDER — LACTATED RINGERS IV SOLN: INTRAVENOUS | Status: AC

## 2020-04-07 MED ORDER — OXYCODONE HCL 5 MG/5ML PO SOLN: 5.0000 mg | Freq: Once | ORAL | Status: DC | PRN

## 2020-04-07 MED ORDER — CHLORHEXIDINE GLUCONATE CLOTH 2 % EX PADS
6.0000 | MEDICATED_PAD | Freq: Every day | CUTANEOUS | Status: DC
  Administered 2020-04-08 – 2020-04-09 (×2): 6 via TOPICAL

## 2020-04-07 MED ORDER — MIDAZOLAM HCL 2 MG/2ML IJ SOLN
INTRAMUSCULAR | Status: AC
  Filled 2020-04-07: qty 2

## 2020-04-07 MED ORDER — PHENYLEPHRINE 40 MCG/ML (10ML) SYRINGE FOR IV PUSH (FOR BLOOD PRESSURE SUPPORT)
PREFILLED_SYRINGE | INTRAVENOUS | Status: AC
  Filled 2020-04-07: qty 10

## 2020-04-07 MED ORDER — SUCCINYLCHOLINE CHLORIDE 200 MG/10ML IV SOSY
PREFILLED_SYRINGE | INTRAVENOUS | Status: AC
  Filled 2020-04-07: qty 10

## 2020-04-07 MED ORDER — VANCOMYCIN HCL 1000 MG IV SOLR
INTRAVENOUS | Status: AC
  Filled 2020-04-07: qty 1000

## 2020-04-07 MED ORDER — MUPIROCIN 2 % EX OINT
1.0000 "application " | TOPICAL_OINTMENT | Freq: Two times a day (BID) | CUTANEOUS | Status: DC
  Administered 2020-04-07 – 2020-04-09 (×4): 1 via NASAL
  Filled 2020-04-07: qty 22

## 2020-04-07 MED ORDER — 0.9 % SODIUM CHLORIDE (POUR BTL) OPTIME
TOPICAL | Status: DC | PRN
  Administered 2020-04-07: 20:00:00 1000 mL

## 2020-04-07 MED ORDER — HYDROMORPHONE HCL 1 MG/ML IJ SOLN
0.2500 mg | INTRAMUSCULAR | Status: DC | PRN
Start: 2020-04-07 — End: 2020-04-07

## 2020-04-07 MED ORDER — MIDAZOLAM HCL 2 MG/2ML IJ SOLN
INTRAMUSCULAR | Status: DC | PRN
  Administered 2020-04-07: 2 mg via INTRAVENOUS

## 2020-04-07 MED ORDER — LIDOCAINE HCL (PF) 2 % IJ SOLN
INTRAMUSCULAR | Status: AC
  Filled 2020-04-07: qty 5

## 2020-04-07 MED ORDER — SODIUM CHLORIDE 0.9 % IV BOLUS
1000.0000 mL | Freq: Once | INTRAVENOUS | Status: AC
  Administered 2020-04-07: 04:00:00 1000 mL via INTRAVENOUS

## 2020-04-07 MED ORDER — FENTANYL CITRATE (PF) 250 MCG/5ML IJ SOLN
INTRAMUSCULAR | Status: DC | PRN
  Administered 2020-04-07 (×2): 50 ug via INTRAVENOUS

## 2020-04-07 MED ORDER — PHENYLEPHRINE 40 MCG/ML (10ML) SYRINGE FOR IV PUSH (FOR BLOOD PRESSURE SUPPORT)
PREFILLED_SYRINGE | INTRAVENOUS | Status: DC | PRN
  Administered 2020-04-07: 80 ug via INTRAVENOUS

## 2020-04-07 MED ORDER — SODIUM CHLORIDE 0.9 % IR SOLN
Status: DC | PRN
  Administered 2020-04-07: 3000 mL

## 2020-04-07 SURGICAL SUPPLY — 36 items
BLADE HEX COATED 2.75 (ELECTRODE) ×3 IMPLANT
BLADE SURG 15 STRL LF DISP TIS (BLADE) ×1 IMPLANT
BLADE SURG 15 STRL SS (BLADE) ×2
BNDG COHESIVE 4X5 TAN STRL (GAUZE/BANDAGES/DRESSINGS) IMPLANT
BNDG ELASTIC 3X5.8 VLCR STR LF (GAUZE/BANDAGES/DRESSINGS) ×3 IMPLANT
BNDG ESMARK 4X9 LF (GAUZE/BANDAGES/DRESSINGS) ×3 IMPLANT
BNDG GAUZE ELAST 4 BULKY (GAUZE/BANDAGES/DRESSINGS) ×3 IMPLANT
CHLORAPREP W/TINT 26 (MISCELLANEOUS) ×3 IMPLANT
COVER BACK TABLE 60X90IN (DRAPES) ×3 IMPLANT
CUFF TOURN SGL QUICK 18X4 (TOURNIQUET CUFF) ×3 IMPLANT
DRAPE POUCH INSTRU U-SHP 10X18 (DRAPES) ×3 IMPLANT
DRAPE SHEET LG 3/4 BI-LAMINATE (DRAPES) ×6 IMPLANT
DRSG PAD ABDOMINAL 8X10 ST (GAUZE/BANDAGES/DRESSINGS) IMPLANT
GAUZE PACKING IODOFORM 1/4X15 (PACKING) ×3 IMPLANT
GAUZE SPONGE 4X4 12PLY STRL (GAUZE/BANDAGES/DRESSINGS) ×3 IMPLANT
GAUZE XEROFORM 1X8 LF (GAUZE/BANDAGES/DRESSINGS) ×3 IMPLANT
GLOVE BIO SURGEON STRL SZ7.5 (GLOVE) ×3 IMPLANT
GLOVE BIOGEL PI IND STRL 8 (GLOVE) ×1 IMPLANT
GLOVE BIOGEL PI INDICATOR 8 (GLOVE) ×2
GOWN STRL REUS W/TWL XL LVL3 (GOWN DISPOSABLE) ×6 IMPLANT
KIT BASIN OR (CUSTOM PROCEDURE TRAY) ×3 IMPLANT
KIT TURNOVER KIT A (KITS) IMPLANT
NEEDLE HYPO 25X1 1.5 SAFETY (NEEDLE) ×3 IMPLANT
PACK ORTHO EXTREMITY (CUSTOM PROCEDURE TRAY) ×3 IMPLANT
PENCIL SMOKE EVACUATOR (MISCELLANEOUS) IMPLANT
SET IRRIG Y TYPE TUR BLADDER L (SET/KITS/TRAYS/PACK) IMPLANT
STAPLER VISISTAT 35W (STAPLE) ×3 IMPLANT
STOCKINETTE 8 INCH (MISCELLANEOUS) ×3 IMPLANT
SUT ETHILON 4 0 PS 2 18 (SUTURE) ×3 IMPLANT
SUT MNCRL AB 3-0 PS2 18 (SUTURE) IMPLANT
SUT MON AB 5-0 PS2 18 (SUTURE) IMPLANT
SUT VIC AB 3-0 PS2 18 (SUTURE)
SUT VIC AB 3-0 PS2 18XBRD (SUTURE) IMPLANT
SUT VIC AB 4-0 PS2 18 (SUTURE) ×3 IMPLANT
SYR CONTROL 10ML LL (SYRINGE) ×3 IMPLANT
TOWEL OR 17X26 10 PK STRL BLUE (TOWEL DISPOSABLE) ×3 IMPLANT

## 2020-04-07 NOTE — Anesthesia Postprocedure Evaluation (Signed)
Anesthesia Post Note  Patient: Shirley [name]  Procedure(s) Performed: Incision and Drainage Right Foot (Right Foot)     Patient location during evaluation: PACU Anesthesia Type: General Level of consciousness: awake and alert, oriented and patient cooperative Pain management: pain level controlled Vital Signs Assessment: post-procedure vital signs reviewed and stable Respiratory status: spontaneous breathing, nonlabored ventilation and respiratory function stable Cardiovascular status: blood pressure returned to baseline and stable Postop Assessment: no apparent nausea or vomiting Anesthetic complications: no   No complications documented.  Last Vitals:  Vitals:   04/07/20 2000 04/07/20 2001  BP: 134/83   Pulse: (!) 101   Resp: 19   Temp: 36.9 C (P) 36.9 C  SpO2: 100%     Last Pain:  Vitals:   04/07/20 2000  TempSrc:   PainSc: 0-No pain                 Pervis Hocking

## 2020-04-07 NOTE — Progress Notes (Signed)
Inpatient Diabetes Program Recommendations  AACE/ADA: New Consensus Statement on Inpatient Glycemic Control (2015)  Target Ranges:  Prepandial:   less than 140 mg/dL      Peak postprandial:   less than 180 mg/dL (1-2 hours)      Critically ill patients:  140 - 180 mg/dL   Lab Results  Component Value Date   GLUCAP 225 (H) 04/07/2020   HGBA1C 12.3 (H) 04/07/2020    Spoke with patient regarding outpatient diabetes management. Denies missing injections, however unable to recall dosages or the last time of injection.  Reviewed patient's current A1c of 12.3%. Explained what a A1c is and what it measures. Also reviewed goal A1c with patient, importance of good glucose control @ home, and blood sugar goals. Reviewed patho of DM, need for insulin, impact of missed injections, role of pancreas, vascular changes, impact to increased risk of infection given here for foot injection, as well as other commorbidities.  Patient reports having a meter and testing supplies. States, "they have been high here lately." Denies issues with affordability. Reviewed when to call MD and interventions.  Patient is planning to follow up with PCP on Monday. Has no further questions at this time.   Thanks, Bronson Curb, MSN, RNC-OB Diabetes Coordinator [phone] (8a-5p)

## 2020-04-07 NOTE — Progress Notes (Signed)
PROGRESS NOTE    Shirley [name]  HCW:[id number] DOB: 1975-05-13 DOA: 04/06/2020 PCP: Fredrich Romans, PA   Brief Narrative: Taken from H&P. Shirley [name] is a 44 y.o. female with medical history significant of DM2, HTN. Presenting with right foot pain and swelling that started 2.5 weeks ago. She initially thought it was just painful calloses, but she had progressive swelling and discoloration of the bottom of her foot to her ankles over the next couple of weeks. She treat percocet for the pain, but it did not help. She denies fevers, drainage. She saw her podiatrist yesterday and was given the recommendation to come to the hospital.   XR showed no signs of osteo.  MRI with abscess and marrow edema.  Podiatry is going to take her to the OR later today.  Subjective: Patient was complaining of right foot pain.  Assessment & Plan:   Active Problems:   Diabetic foot infection ([place])  Diabetic foot infection.  MRI with concern of marrow edema and abscess.  Going to the OR for debridement and abscess drainage with podiatry later today. -Continue with cefepime and vanc. -Follow-up abscess culture results.  Type 2 diabetes mellitus.  Uncontrolled with A1c of 12.3 and hyperglycemia. -Continue with Lantus and SSI.  CKD stage IIIb.  Creatinine stable at her baseline. -Continue to monitor. -Avoid nephrotoxins.   Objective: Vitals:   04/07/20 1057 04/07/20 1100 04/07/20 1804 04/07/20 1815  BP: (!) 130/92   (!) 148/82  Pulse: (!) 52   (!) 101  Resp: 16   18  Temp: 98.5 F (36.9 C)   98.8 F (37.1 C)  TempSrc: Oral   Oral  SpO2: (!) 83% 100%  99%  Weight:   93.9 kg   Height:   5\' 7"  (1.702 m)     Intake/Output Summary (Last 24 hours) at 04/07/2020 1848 Last data filed at 04/07/2020 1603 Gross per 24 hour  Intake 2237.28 ml  Output 1000 ml  Net 1237.28 ml   Filed Weights   04/05/20 2230 04/07/20 1804  Weight: 93.9 kg 93.9 kg    Examination:  General exam: Appears calm and  comfortable  Respiratory system: Clear to auscultation. Respiratory effort normal. Cardiovascular system: S1 & S2 heard, RRR. Gastrointestinal system: Soft, nontender, nondistended, bowel sounds positive. Central nervous system: Alert and oriented. No focal neurological deficits.Symmetric 5 x 5 power. Extremities: Tenderness along right plantar surface with some surrounding edema, no cyanosis, pulses intact and symmetrical. Psychiatry: Judgement and insight appear normal. Mood & affect appropriate.    DVT prophylaxis: Lovenox Code Status: Full Family Communication: Discussed with patient Disposition Plan:  Status is: Inpatient  Remains inpatient appropriate because:Inpatient level of care appropriate due to severity of illness   Dispo: The patient is from: Home              Anticipated d/c is to: Home              Anticipated d/c date is: 2 days              Patient currently is not medically stable to d/c.   Consultants:   Podiatry  Procedures:  Antimicrobials:  Vancomycin Cefepime  Data Reviewed: I have personally reviewed following labs and imaging studies  CBC: Recent Labs  Lab 04/05/20 2246 04/07/20 0122 04/07/20 0528  WBC 14.1* 12.1* 11.7*  NEUTROABS 9.6* 7.1  --   HGB 11.9* 11.8* 11.6*  HCT 34.6* 34.0* 33.3*  MCV 79.4* 79.8* 79.3*  PLT 372  408* 323*   Basic Metabolic Panel: Recent Labs  Lab 04/05/20 2246 04/07/20 0122 04/07/20 0528  NA 132* 136 139  K 3.8 3.6 4.0  CL 101 103 105  CO2 24 25 25   GLUCOSE 359* 299* 147*  BUN 18 20 20   CREATININE 1.30* 1.26* 1.21*  CALCIUM 8.7* 8.5* 8.4*  MG  --  2.0  --    GFR: Estimated Creatinine Clearance: 68.3 mL/min (A) (by C-G formula based on SCr of 1.21 mg/dL (H)). Liver Function Tests: Recent Labs  Lab 04/05/20 2246 04/07/20 0122 04/07/20 0528  AST 15 9* 12*  ALT 8 7 6   ALKPHOS 61 60 52  BILITOT 0.3 0.3 0.2*  PROT 7.1 6.6 6.3*  ALBUMIN 2.7* 2.4* 2.4*   No results for input(s): LIPASE, AMYLASE  in the last 168 hours. No results for input(s): AMMONIA in the last 168 hours. Coagulation Profile: No results for input(s): INR, PROTIME in the last 168 hours. Cardiac Enzymes: No results for input(s): CKTOTAL, CKMB, CKMBINDEX, TROPONINI in the last 168 hours. BNP (last 3 results) No results for input(s): PROBNP in the last 8760 hours. HbA1C: Recent Labs    04/07/20 0122  HGBA1C 12.3*   CBG: Recent Labs  Lab 04/06/20 2309 04/07/20 0748 04/07/20 1145 04/07/20 1625 04/07/20 1813  GLUCAP 412* 123* 225* 98 84   Lipid Profile: No results for input(s): CHOL, HDL, LDLCALC, TRIG, CHOLHDL, LDLDIRECT in the last 72 hours. Thyroid Function Tests: No results for input(s): TSH, T4TOTAL, FREET4, T3FREE, THYROIDAB in the last 72 hours. Anemia Panel: No results for input(s): VITAMINB12, FOLATE, FERRITIN, TIBC, IRON, RETICCTPCT in the last 72 hours. Sepsis Labs: Recent Labs  Lab 04/06/20 0858 04/07/20 0122 04/07/20 0902 04/07/20 1106  LATICACIDVEN 0.9 2.3* 0.7 1.5    Recent Results (from the past 240 hour(s))  Culture, blood (routine x 2)     Status: None (Preliminary result)   Collection Time: 04/06/20  7:24 AM   Specimen: BLOOD  Result Value Ref Range Status   Specimen Description   Final    BLOOD RIGHT ANTECUBITAL Performed at [facility], [place] [address] [place], [place] [address]    Special Requests   Final    BOTTLES DRAWN AEROBIC AND ANAEROBIC Blood Culture adequate volume Performed at [place] [address] [place], [place] [address]    Culture   Final    NO GROWTH 1 DAY Performed at [facility], [place] [address], [place], [place] [address]    Report Status PENDING  Incomplete  Resp Panel by RT-PCR (Flu A&B, Covid) Nasopharyngeal Swab     Status: None   Collection Time: 04/06/20  8:57 AM   Specimen: Nasopharyngeal Swab; Nasopharyngeal(NP) swabs in vial transport medium  Result Value Ref Range Status   SARS  Coronavirus 2 by RT PCR NEGATIVE NEGATIVE Final    Comment: (NOTE) SARS-CoV-2 target nucleic acids are NOT DETECTED.  The SARS-CoV-2 RNA is generally detectable in upper respiratory specimens during the acute phase of infection. The lowest concentration of SARS-CoV-2 viral copies this assay can detect is 138 copies/mL. A negative result does not preclude SARS-Cov-2 infection and should not be used as the sole basis for treatment or other patient management decisions. A negative result may occur with  improper specimen collection/handling, submission of specimen other than nasopharyngeal swab, presence of viral mutation(s) within the areas targeted by this assay, and inadequate number of viral copies(<138 copies/mL). A negative result must be combined with  clinical observations, patient history, and epidemiological information. The expected result is Negative.  Fact Sheet for Patients:  EntrepreneurPulse.com.au  Fact Sheet for Healthcare Providers:  IncredibleEmployment.be  This test is no t yet approved or cleared by the [place] FDA and  has been authorized for detection and/or diagnosis of SARS-CoV-2 by FDA under an Emergency Use Authorization (EUA). This EUA will remain  in effect (meaning this test can be used) for the duration of the COVID-19 declaration under Section 564(b)(1) of the Act, 21 U.S.C.section 360bbb-3(b)(1), unless the authorization is terminated  or revoked sooner.       Influenza A by PCR NEGATIVE NEGATIVE Final   Influenza B by PCR NEGATIVE NEGATIVE Final    Comment: (NOTE) The Xpert Xpress SARS-CoV-2/FLU/RSV plus assay is intended as an aid in the diagnosis of influenza from Nasopharyngeal swab specimens and should not be used as a sole basis for treatment. Nasal washings and aspirates are unacceptable for Xpert Xpress SARS-CoV-2/FLU/RSV testing.  Fact Sheet for  Patients: EntrepreneurPulse.com.au  Fact Sheet for Healthcare Providers: IncredibleEmployment.be  This test is not yet approved or cleared by the [place] FDA and has been authorized for detection and/or diagnosis of SARS-CoV-2 by FDA under an Emergency Use Authorization (EUA). This EUA will remain in effect (meaning this test can be used) for the duration of the COVID-19 declaration under Section 564(b)(1) of the Act, 21 U.S.C. section 360bbb-3(b)(1), unless the authorization is terminated or revoked.  Performed at [facility], [place] [address], [place], [place] [address]   Culture, blood (routine x 2)     Status: None (Preliminary result)   Collection Time: 04/06/20  9:15 AM   Specimen: BLOOD  Result Value Ref Range Status   Specimen Description   Final    BLOOD LEFT ANTECUBITAL Performed at [place] [address] [place], [place] [address]    Special Requests   Final    BOTTLES DRAWN AEROBIC AND ANAEROBIC Blood Culture adequate volume Performed at [place] [address], [place], [place] [address]    Culture   Final    NO GROWTH 1 DAY Performed at [facility], [place] [address], [place], [place] [address]    Report Status PENDING  Incomplete  MRSA PCR Screening     Status: Abnormal   Collection Time: 04/07/20  1:23 PM   Specimen: Nasal Mucosa; Nasopharyngeal  Result Value Ref Range Status   MRSA by PCR POSITIVE (A) NEGATIVE Final    Comment:        The GeneXpert MRSA Assay (FDA approved for NASAL specimens only), is one component of a comprehensive MRSA colonization surveillance program. It is not intended to diagnose MRSA infection nor to guide or monitor treatment for MRSA infections. RESULT CALLED TO, READ BACK BY AND VERIFIED WITH: TOMPSON,T. RN @1620  04/07/20 BILLINGSLEY,L Performed at [facility], [place] [address]  [place], [place] [address]      Radiology Studies: MR FOOT RIGHT WO CONTRAST  Result Date: 04/07/2020 CLINICAL DATA:  Right foot infection for 2 weeks EXAM: MRI OF THE RIGHT FOREFOOT WITHOUT CONTRAST TECHNIQUE: Multiplanar, multisequence MR imaging of the right forefoot was performed. No intravenous contrast was administered. COMPARISON:  X-ray 04/06/2020 FINDINGS: Technical note: Motion degraded exam. Bones/Joint/Cartilage Subacute to chronic nondisplaced fracture involving the base of the fourth toe proximal phalanx with extensive marrow edema within the fourth toe base and diaphysis (series 7, image 8). Osseous structures appear otherwise intact  without additional fracture. No dislocation. No bony erosion. Mild arthropathy at the hallux sesamoid complex. Small joint effusions of the first, second, third, and fourth MTP joints. Ligaments Intact Lisfranc ligament. Collateral ligaments forefoot appear intact. Muscles and Tendons Diffuse edema-like signal throughout the intrinsic foot musculature suggesting a nonspecific myositis. No intramuscular fluid collection. Flexor and extensor tendons appear intact. No significant tenosynovial fluid collection. Soft tissues Small focal plantar soft tissue ulceration underlying the level of the second metatarsal diaphysis (series 6, image 27). Fluid collection within the underlying subcutaneous soft tissues measuring 3.6 x 0.7 x 1.0 cm (series 7, image 18). There is prominent soft tissue swelling over the dorsum of the forefoot. Small volume fluid within the first, second, and third intermetatarsal spaces. IMPRESSION: 1. Small focal plantar soft tissue ulceration underlying the level of the second metatarsal diaphysis. Fluid collection within the underlying subcutaneous soft tissues measuring 3.6 x 0.7 x 1.0 cm concerning for abscess. 2. Subacute-to-chronic nondisplaced fracture involving the base of the fourth toe proximal phalanx with extensive marrow edema within the  underlying fourth toe base and diaphysis. 3. No bone marrow signal changes to suggest osteomyelitis. 4. Small joint effusions of the first, second, third, and fourth MTP joints. Findings are likely reactive although septic arthritis not entirely excluded. 5. Diffuse edema-like signal throughout the intrinsic foot musculature suggesting a nonspecific myositis. 6. Small volume fluid within the first through third intermetatarsal spaces, which may reflect bursitis. Findings are in agreement with the preliminary report provided by Dr. Francoise Ceo at 9:55 p.m. on 04/06/2020. Electronically Signed   By: Davina Poke D.O.   On: 04/07/2020 08:16   DG Foot Complete Right  Result Date: 04/06/2020 CLINICAL DATA:  Soft tissue infection EXAM: RIGHT FOOT COMPLETE - 3+ VIEW COMPARISON:  None. FINDINGS: Generalized soft tissue swelling is noted about the metatarsals consistent with the given clinical history. No bony erosive changes are seen. No fracture or dislocation is noted. Small calcaneal spurs are noted. IMPRESSION: Soft tissue swelling consistent with the given clinical history. No acute bony abnormality is noted. Electronically Signed   By: Inez Catalina M.D.   On: 04/06/2020 08:01    Scheduled Meds:  [MAR Hold] amLODipine  10 mg Oral Daily   [MAR Hold] Chlorhexidine Gluconate Cloth  6 each Topical Q0600   [MAR Hold] enoxaparin (LOVENOX) injection  40 mg Subcutaneous Q24H   [MAR Hold] insulin aspart  0-15 Units Subcutaneous TID WC   [MAR Hold] insulin aspart  0-5 Units Subcutaneous QHS   [MAR Hold] insulin glargine  35 Units Subcutaneous QHS   [MAR Hold] mupirocin ointment  1 application Nasal BID   Continuous Infusions:  [MAR Hold] ceFEPime (MAXIPIME) IV 2 g (04/07/20 0407)   lactated ringers 100 mL/hr at 04/07/20 1609   lactated ringers 75 mL/hr at 04/07/20 1834   [MAR Hold] vancomycin 166.7 mL/hr at 04/07/20 1100     LOS: 1 day   Time spent: 35 minutes  Lorella Nimrod, MD [facility]  If 7PM-7AM, please contact night-coverage [email or web address]  04/07/2020, 6:48 PM   This record has been created using Dragon voice recognition software. Errors have been sought and corrected,but may not always be located. Such creation errors do not reflect on the standard of care.

## 2020-04-07 NOTE — Progress Notes (Signed)
  Subjective:  Patient ID: Shirley [name], female    DOB: [birth year],  MRN: [id number]  Patient seen in pre-op. Understands plan for OR. Objective:   Vitals:   04/07/20 1100 04/07/20 1815  BP:  (!) 148/82  Pulse:  (!) 101  Resp:  18  Temp:  98.8 F (37.1 C)  SpO2: 100% 99%   Physical Exam  General: AAO x3, NAD  Dermatological: On the plantar aspect of the right foot on the midfoot area is what appears to be callus, puncture wound and there is what seems to be fluid, possible abscess underneath the skin.  There is tenderness palpation this area.  There is edema to the plantar musculature extending to the dorsal foot, leg.  Mild warmth of the foot.  Vascular: Dorsalis Pedis artery and Posterior Tibial artery pedal pulses are 2/4 bilateral with immedate capillary fill time.  There is no pain with calf compression, swelling, warmth, erythema.   Neruologic: Grossly intact via light touch bilateral.   Musculoskeletal: Tenderness the skin lesion plantar foot muscular strength 5/5 in all groups tested bilateral.  Gait: Unassisted, Nonantalgic.   Assessment & Plan:  Patient was evaluated and treated and all questions answered.  Cellulitis and Abscess right foot -MRI reviewed -Continue empiric IV Abx. -Surgery today for I and D of abscess -Heel WBAT RLE -Will continue to follow.  Evelina Bucy, DPM  Best accessible via secure chat for questions or concerns.

## 2020-04-07 NOTE — Progress Notes (Signed)
Critical lab: lactic acid 2.3; Sharlet Salina, NP notified.

## 2020-04-07 NOTE — Progress Notes (Signed)
Dr March Rummage notified of positive MRSA nasal swab.

## 2020-04-07 NOTE — Op Note (Signed)
  Patient Name: Shirley [name] DOB: 1975/08/16  MRN: [id number]   Date of Surgery: 04/07/20 Surgeon: Dr. Hardie Pulley, DPM Assistants: none  Pre-operative Diagnosis:  Abscess of right foot Post-operative Diagnosis:  Same Procedures:  1) Incision and drainage below the deep fascia, one abscess Pathology/Specimens: ID Type Source Tests Collected by Time Destination  A : Right foot abscess Abscess Wound AEROBIC/ANAEROBIC CULTURE (SURGICAL/DEEP WOUND), FUNGUS STAIN Evelina Bucy, DPM 04/07/2020 1942    Anesthesia: MAC Hemostasis: * No tourniquets in log * Estimated Blood Loss: 0 mL Materials: * No implants in log * Medications: 20 ml marcaine 9.0% plain Complications: none  Indications for Procedure:  This is a 44 y.o. female with an abscess to the right foot as evidenced on MRI. She presents for operative intervention. All risks, benefits, and alternatives of surgery were discussed.   Procedure in Detail: Patient was identified in pre-operative holding area. Formal consent was signed and the right lower extremity was marked. Patient was brought back to the operating room. Anesthesia was induced. The extremity was prepped and draped in the usual sterile fashion. Timeout was taken to confirm patient name, laterality, and procedure prior to incision.   Attention was then directed to the right foot.  There was what appeared to be a puncture wound to the plantar aspect the foot. An incision was made over the fluctuant area. Immediate purulence was returned. This was collected for culture.  Incision was continued below the deep fascia.  The wound was explored with a hemostat.  It did not track deep of the flexor tendons.  All purulence was maximally expressed.  The wound was then thoroughly irrigated with approximately 2 L of normal saline via pulse lavage.  The puncture wound was then excised.  The skin edges were then reapproximated with 4-0 nylon with a central area left open for  packing.  The wound was packed with quarter inch iodoform packing.  The foot was then dressed with xeroform, 4x4, kerlix, ACE bandage. Patient tolerated the procedure well.   Disposition: Following a period of post-operative monitoring, patient will be transferred to the floor.

## 2020-04-07 NOTE — Anesthesia Preprocedure Evaluation (Addendum)
Anesthesia Evaluation  Patient identified by MRN, date of birth, ID band Patient awake    Reviewed: Allergy & Precautions, NPO status , Patient's Chart, lab work & pertinent test results  Airway Mallampati: III  TM Distance: >3 FB Neck ROM: Full    Dental  (+) Poor Dentition, Missing, Chipped, Loose, Dental Advisory Given,  Missing multiple teeth, very poor dentition :   Pulmonary asthma , sleep apnea (noncompliant with CPAP) , former smoker,  Former smoker, quit 1998   Pulmonary exam normal breath sounds clear to auscultation       Cardiovascular hypertension, Pt. on medications + CAD and + Past MI  Normal cardiovascular exam Rhythm:Regular Rate:Normal  Last echo 2009 nml   Neuro/Psych  Headaches, PSYCHIATRIC DISORDERS Anxiety Depression Peripheral neuropathy 2/2 poorly controlled T2DM  Neuromuscular disease CVA, No Residual Symptoms    GI/Hepatic GERD  ,(+)     substance abuse  , Hepatitis -, BChronic nausea and vomiting- has been worked up, unclear etiology- possible gastroparesis 2/2 uncontrolled T2DM but normal gastric emptying study    Endo/Other  diabetes, Poorly Controlled, Type 2, Insulin Dependent, Oral Hypoglycemic AgentsLast a1c 12.3 Obesity BMI 32  Renal/GU Renal InsufficiencyRenal diseaseCr 1.21  negative genitourinary   Musculoskeletal  (+) Fibromyalgia -, narcotic dependent  Abdominal (+) + obese,   Peds  Hematology  (+) Blood dyscrasia, anemia , hct 33, plt 408   Anesthesia Other Findings   Reproductive/Obstetrics negative OB ROS                           Anesthesia Physical Anesthesia Plan  ASA: III  Anesthesia Plan: General   Post-op Pain Management:    Induction: Intravenous and Rapid sequence  PONV Risk Score and Plan: 3 and Ondansetron, Treatment may vary due to age or medical condition and Midazolam  Airway Management Planned: Oral ETT  Additional  Equipment: None  Intra-op Plan:   Post-operative Plan: Extubation in OR  Informed Consent: I have reviewed the patients History and Physical, chart, labs and discussed the procedure including the risks, benefits and alternatives for the proposed anesthesia with the patient or authorized representative who has indicated his/her understanding and acceptance.     Dental advisory given  Plan Discussed with: CRNA  Anesthesia Plan Comments: (Poorly controlled T2DM with known gastroparesis and chronic N/V, last emesis last night. RSI for airway protection. )      Anesthesia Quick Evaluation

## 2020-04-07 NOTE — Anesthesia Procedure Notes (Signed)
Procedure Name: Intubation Date/Time: 04/07/2020 7:22 PM Performed by: Claudia Desanctis, CRNA Pre-anesthesia Checklist: Patient identified, Emergency Drugs available, Suction available and Patient being monitored Patient Re-evaluated:Patient Re-evaluated prior to induction Oxygen Delivery Method: Circle system utilized Preoxygenation: Pre-oxygenation with 100% oxygen Induction Type: IV induction, Rapid sequence and Cricoid Pressure applied Laryngoscope Size: Miller and 2 Grade View: Grade I Tube type: Oral Tube size: 7.0 mm Number of attempts: 1 Airway Equipment and Method: Stylet and Oral airway Placement Confirmation: ETT inserted through vocal cords under direct vision,  positive ETCO2 and breath sounds checked- equal and bilateral Secured at: 22 cm Tube secured with: Tape Dental Injury: Teeth and Oropharynx as per pre-operative assessment

## 2020-04-07 NOTE — Progress Notes (Signed)
Attempted to call patient to review MRI findings and plan for surgery today. Patient did not answer. Will re-attempt.

## 2020-04-07 NOTE — Transfer of Care (Signed)
Immediate Anesthesia Transfer of Care Note  Patient: Shirley [name]  Procedure(s) Performed: Incision and Drainage Right Foot (Right Foot)  Patient Location: PACU  Anesthesia Type:General  Level of Consciousness: awake, alert , oriented and patient cooperative  Airway & Oxygen Therapy: Patient Spontanous Breathing and Patient connected to face mask  Post-op Assessment: Report given to RN and Post -op Vital signs reviewed and stable  Post vital signs: Reviewed and stable  Last Vitals:  Vitals Value Taken Time  BP    Temp    Pulse    Resp    SpO2      Last Pain:  Vitals:   04/07/20 1818  TempSrc:   PainSc: 10-Worst pain ever      Patients Stated Pain Goal: 0 (98/11/91 4782)  Complications: No complications documented.

## 2020-04-08 ENCOUNTER — Encounter (HOSPITAL_COMMUNITY): Payer: Self-pay | Admitting: Podiatry

## 2020-04-08 LAB — BLOOD CULTURE ID PANEL (REFLEXED) - BCID2

## 2020-04-08 LAB — GLUCOSE, CAPILLARY
Glucose-Capillary: 194 mg/dL — ABNORMAL HIGH (ref 70–99)
Glucose-Capillary: 185 mg/dL — ABNORMAL HIGH (ref 70–99)
Glucose-Capillary: 190 mg/dL — ABNORMAL HIGH (ref 70–99)
Glucose-Capillary: 228 mg/dL — ABNORMAL HIGH (ref 70–99)

## 2020-04-08 LAB — CREATININE, SERUM
Creatinine, Ser: 1.26 mg/dL — ABNORMAL HIGH (ref 0.44–1.00)
GFR, Estimated: 53 mL/min — ABNORMAL LOW (ref >60)

## 2020-04-08 NOTE — Evaluation (Signed)
Occupational Therapy Evaluation Patient Details Name: Shirley [name] MRN: [id number] DOB: 1975/05/28 Today's Date: 04/08/2020    History of Present Illness Shirley [name] is a 44 y.o. female with medical history significant of DM2, HTN. Presenting with right foot pain and swelling that started 2.5 weeks ago now s/p I &D of R foot.   Clinical Impression   Shirley [name] is a 44 year old woman s/p I & D of right foot. On evaluation patient demonstrates ability to perform functional mobility with use of RW and post op shoe and ability to perform BADLs with increased time. Education provided in regards to weight bearing status, need for use of post op shoe when ambulating, weight bearing through heel and potential need for shower chair on return home. Patient verbalizes understanding of all education. Patient reports having assistance of significant other and children at discharge.     Follow Up Recommendations  No OT follow up    Equipment Recommendations  Tub/shower seat    Recommendations for Other Services       Precautions / Restrictions Precautions Precautions: None Restrictions Weight Bearing Restrictions: Yes RLE Weight Bearing: Weight bearing as tolerated Other Position/Activity Restrictions: Through Heel with Post Op Shoe      Mobility Bed Mobility Overal bed mobility: Modified Independent             General bed mobility comments: increased time.    Transfers Overall transfer level: Modified independent Equipment used: Rolling walker (2 wheeled)             General transfer comment: Ambulated in room with RW with increased time. verbal cue for technique.    Balance Overall balance assessment: No apparent balance deficits (not formally assessed)                                         ADL either performed or assessed with clinical judgement   ADL Overall ADL's : Modified independent                                        General ADL Comments: Able to perform with increased time and use of RW for functional mobility. Educated patient on potential need for shower chair for future bathing or performing sponge bathes as needed -depending on wound care orders. Demonstrates ability to rise from low toilet with something to push up on (has a sink counter at home).     Vision Patient Visual Report: No change from baseline       Perception     Praxis      Pertinent Vitals/Pain Pain Assessment: 0-10 Pain Score: 9  Pain Location: Rt foot Pain Descriptors / Indicators: Grimacing Pain Intervention(s): Patient requesting pain meds-RN notified;Monitored during session     Hand Dominance     Extremity/Trunk Assessment Upper Extremity Assessment Upper Extremity Assessment: Overall WFL for tasks assessed   Lower Extremity Assessment Lower Extremity Assessment: Defer to PT evaluation   Cervical / Trunk Assessment Cervical / Trunk Assessment: Normal   Communication     Cognition Arousal/Alertness: Awake/alert Behavior During Therapy: WFL for tasks assessed/performed Overall Cognitive Status: Within Functional Limits for tasks assessed  General Comments       Exercises     Shoulder Instructions      Home Living Family/patient expects to be discharged to:: Private residence Living Arrangements: Spouse/significant other;Children Available Help at Discharge: Family Type of Home: Apartment Home Access: Stairs to enter Secretary/administrator of Steps: 15   Home Layout: One level     Bathroom Shower/Tub: Chief Strategy Officer: Standard     Home Equipment: None          Prior Functioning/Environment                   OT Problem List: Pain      OT Treatment/Interventions:      OT Goals(Current goals can be found in the care plan section) Acute Rehab OT Goals Patient Stated Goal: Did not state OT Goal  Formulation: All assessment and education complete, DC therapy  OT Frequency:     Barriers to D/C:            Co-evaluation              AM-PAC OT "6 Clicks" Daily Activity     Outcome Measure Help from another person eating meals?: None Help from another person taking care of personal grooming?: None Help from another person toileting, which includes using toliet, bedpan, or urinal?: None Help from another person bathing (including washing, rinsing, drying)?: None Help from another person to put on and taking off regular upper body clothing?: None Help from another person to put on and taking off regular lower body clothing?: None 6 Click Score: 24   End of Session Equipment Utilized During Treatment: Rolling walker Nurse Communication: Mobility status  Activity Tolerance: Patient limited by pain Patient left: in bed;with call bell/phone within reach;with nursing/sitter in room  OT Visit Diagnosis: Other abnormalities of gait and mobility (R26.89);Pain Pain - Right/Left: Right Pain - part of body: Ankle and joints of foot                Time: 8756-4332 OT Time Calculation (min): 15 min Charges:  OT General Charges $OT Visit: 1 Visit OT Evaluation $OT Eval Low Complexity: 1 Low  Hadeel Hillebrand, OTR/L Acute Care Rehab Services  Office [phone] Pager: [phone]   Kelli Churn 04/08/2020, 4:47 PM

## 2020-04-08 NOTE — Progress Notes (Signed)
PROGRESS NOTE    Shirley [name]  FOY:[id number] DOB: 05/25/1975 DOA: 04/06/2020 PCP: Fredrich Romans, PA   Brief Narrative: Taken from H&P. Shirley [name] is a 44 y.o. female with medical history significant of DM2, HTN. Presenting with right foot pain and swelling that started 2.5 weeks ago. She initially thought it was just painful calloses, but she had progressive swelling and discoloration of the bottom of her foot to her ankles over the next couple of weeks. She treat percocet for the pain, but it did not help. She denies fevers, drainage. She saw her podiatrist yesterday and was given the recommendation to come to the hospital.   XR showed no signs of osteo.  MRI with abscess and marrow edema.  Podiatry did I&D yesterday, preliminary cultures with gram-positive cocci.  Subjective: She was complaining of right foot pain at the surgical site.  We discussed about having a sleep apnea and she endorsed that she was supposed to use CPAP but apparently lost it during a move and trying to get it back.  Assessment & Plan:   Active Problems:   Diabetic foot infection ([place])   Abscess of bursa of right foot  Diabetic foot infection.  MRI with concern of marrow edema and abscess.  Taken to the OR for debridement and abscess drainage with podiatry yesterday, preliminary cultures with gram-positive cocci.  1 out of 4 blood culture bottles also with gram-positive cocci which might be a contaminant.  Patient is already on Vanco. -Continue with cefepime and vanc. -Follow-up abscess culture results. -PT evaluation.  Type 2 diabetes mellitus.  Uncontrolled with A1c of 12.3 and hyperglycemia. -Continue with Lantus and SSI.  CKD stage IIIb.  Creatinine stable at her baseline. -Continue to monitor. -Avoid nephrotoxins.  OSA.  Patient was desaturating while sleeping.  When I ask her she was supposed to use CPAP at home which she was not using it recently as she lost the machine during a move. -Start  her on CPAP at night.  Objective: Vitals:   04/07/20 2302 04/08/20 0002 04/08/20 0156 04/08/20 0601  BP: (!) 153/85 (!) 165/99 (!) 168/86 (!) 145/86  Pulse: (!) 103 (!) 107 100 (!) 109  Resp: 16 17 17 18   Temp: 98.9 F (37.2 C) 98.6 F (37 C) 98.9 F (37.2 C) 98.3 F (36.8 C)  TempSrc: Oral Oral Oral   SpO2: (!) 86% 100% 100% 100%  Weight:      Height:        Intake/Output Summary (Last 24 hours) at 04/08/2020 0832 Last data filed at 04/07/2020 2015 Gross per 24 hour  Intake 1890 ml  Output 1000 ml  Net 890 ml   Filed Weights   04/05/20 2230 04/07/20 1804  Weight: 93.9 kg 93.9 kg    Examination:  General.  Well-developed, obese lady, in no acute distress. Pulmonary.  Lungs clear bilaterally, normal respiratory effort. CV.  Regular rate and rhythm, no JVD, rub or murmur. Abdomen.  Soft, nontender, nondistended, BS positive. CNS.  Alert and oriented x3.  No focal neurologic deficit. Extremities.  No edema, no cyanosis, pulses intact and symmetrical, right foot with clean bandage and Ace wrap. Psychiatry.  Judgment and insight appears normal.   DVT prophylaxis: Lovenox Code Status: Full Family Communication: Discussed with patient Disposition Plan:  Status is: Inpatient  Remains inpatient appropriate because:Inpatient level of care appropriate due to severity of illness   Dispo: The patient is from: Home  Anticipated d/c is to: Home              Anticipated d/c date is: 1 day.              Patient currently is not medically stable to d/c.   Consultants:   Podiatry  Procedures:  Antimicrobials:  Vancomycin Cefepime  Data Reviewed: I have personally reviewed following labs and imaging studies  CBC: Recent Labs  Lab 04/05/20 2246 04/07/20 0122 04/07/20 0528  WBC 14.1* 12.1* 11.7*  NEUTROABS 9.6* 7.1  --   HGB 11.9* 11.8* 11.6*  HCT 34.6* 34.0* 33.3*  MCV 79.4* 79.8* 79.3*  PLT 372 408* 811*   Basic Metabolic Panel: Recent Labs   Lab 04/05/20 2246 04/07/20 0122 04/07/20 0528 04/08/20 0510  NA 132* 136 139  --   K 3.8 3.6 4.0  --   CL 101 103 105  --   CO2 24 25 25   --   GLUCOSE 359* 299* 147*  --   BUN 18 20 20   --   CREATININE 1.30* 1.26* 1.21* 1.26*  CALCIUM 8.7* 8.5* 8.4*  --   MG  --  2.0  --   --    GFR: Estimated Creatinine Clearance: 65.6 mL/min (A) (by C-G formula based on SCr of 1.26 mg/dL (H)). Liver Function Tests: Recent Labs  Lab 04/05/20 2246 04/07/20 0122 04/07/20 0528  AST 15 9* 12*  ALT 8 7 6   ALKPHOS 61 60 52  BILITOT 0.3 0.3 0.2*  PROT 7.1 6.6 6.3*  ALBUMIN 2.7* 2.4* 2.4*   No results for input(s): LIPASE, AMYLASE in the last 168 hours. No results for input(s): AMMONIA in the last 168 hours. Coagulation Profile: No results for input(s): INR, PROTIME in the last 168 hours. Cardiac Enzymes: No results for input(s): CKTOTAL, CKMB, CKMBINDEX, TROPONINI in the last 168 hours. BNP (last 3 results) No results for input(s): PROBNP in the last 8760 hours. HbA1C: Recent Labs    04/07/20 0122  HGBA1C 12.3*   CBG: Recent Labs  Lab 04/07/20 1625 04/07/20 1813 04/07/20 1959 04/07/20 2217 04/08/20 0745  GLUCAP 98 84 91 218* 194*   Lipid Profile: No results for input(s): CHOL, HDL, LDLCALC, TRIG, CHOLHDL, LDLDIRECT in the last 72 hours. Thyroid Function Tests: No results for input(s): TSH, T4TOTAL, FREET4, T3FREE, THYROIDAB in the last 72 hours. Anemia Panel: No results for input(s): VITAMINB12, FOLATE, FERRITIN, TIBC, IRON, RETICCTPCT in the last 72 hours. Sepsis Labs: Recent Labs  Lab 04/06/20 0858 04/07/20 0122 04/07/20 0902 04/07/20 1106  LATICACIDVEN 0.9 2.3* 0.7 1.5    Recent Results (from the past 240 hour(s))  Culture, blood (routine x 2)     Status: None (Preliminary result)   Collection Time: 04/06/20  7:24 AM   Specimen: BLOOD  Result Value Ref Range Status   Specimen Description   Final    BLOOD RIGHT ANTECUBITAL Performed at [facility] [place] [address] [place], [place] [address]    Special Requests   Final    BOTTLES DRAWN AEROBIC AND ANAEROBIC Blood Culture adequate volume Performed at [facility] [address], [place], [place] [address]    Culture  Setup Time   Final    AEROBIC BOTTLE ONLY GRAM POSITIVE COCCI Organism ID to follow CRITICAL RESULT CALLED TO, READ BACK BY AND VERIFIED WITH: Christean Grief PharmD 8:15 04/08/20 ([email or web address]) Performed at [facility], [address] [place], [place] [address]    Culture PENDING  Incomplete   Report Status PENDING  Incomplete  Blood Culture ID Panel (Reflexed)     Status: Abnormal   Collection Time: 04/06/20  7:24 AM  Result Value Ref Range Status   Enterococcus faecalis NOT DETECTED NOT DETECTED Final   Enterococcus Faecium NOT DETECTED NOT DETECTED Final   Listeria monocytogenes NOT DETECTED NOT DETECTED Final   Staphylococcus species DETECTED (A) NOT DETECTED Final    Comment: CRITICAL RESULT CALLED TO, READ BACK BY AND VERIFIED WITH: Christean Grief PharmD 8:15 04/08/20 ([email or web address])    Staphylococcus aureus (BCID) NOT DETECTED NOT DETECTED Final   Staphylococcus epidermidis NOT DETECTED NOT DETECTED Final   Staphylococcus lugdunensis NOT DETECTED NOT DETECTED Final   Streptococcus species NOT DETECTED NOT DETECTED Final   Streptococcus agalactiae NOT DETECTED NOT DETECTED Final   Streptococcus pneumoniae NOT DETECTED NOT DETECTED Final   Streptococcus pyogenes NOT DETECTED NOT DETECTED Final   A.calcoaceticus-baumannii NOT DETECTED NOT DETECTED Final   Bacteroides fragilis NOT DETECTED NOT DETECTED Final   Enterobacterales NOT DETECTED NOT DETECTED Final   Enterobacter cloacae complex NOT DETECTED NOT DETECTED Final   Escherichia coli NOT DETECTED NOT DETECTED Final   Klebsiella aerogenes NOT DETECTED NOT DETECTED Final   Klebsiella oxytoca NOT DETECTED NOT DETECTED Final   Klebsiella pneumoniae NOT DETECTED NOT DETECTED Final   Proteus  species NOT DETECTED NOT DETECTED Final   Salmonella species NOT DETECTED NOT DETECTED Final   Serratia marcescens NOT DETECTED NOT DETECTED Final   Haemophilus influenzae NOT DETECTED NOT DETECTED Final   Neisseria meningitidis NOT DETECTED NOT DETECTED Final   Pseudomonas aeruginosa NOT DETECTED NOT DETECTED Final   Stenotrophomonas maltophilia NOT DETECTED NOT DETECTED Final   Candida albicans NOT DETECTED NOT DETECTED Final   Candida auris NOT DETECTED NOT DETECTED Final   Candida glabrata NOT DETECTED NOT DETECTED Final   Candida krusei NOT DETECTED NOT DETECTED Final   Candida parapsilosis NOT DETECTED NOT DETECTED Final   Candida tropicalis NOT DETECTED NOT DETECTED Final   Cryptococcus neoformans/gattii NOT DETECTED NOT DETECTED Final    Comment: Performed at [facility], [address], [place], [place] [address]  Resp Panel by RT-PCR (Flu A&B, Covid) Nasopharyngeal Swab     Status: None   Collection Time: 04/06/20  8:57 AM   Specimen: Nasopharyngeal Swab; Nasopharyngeal(NP) swabs in vial transport medium  Result Value Ref Range Status   SARS Coronavirus 2 by RT PCR NEGATIVE NEGATIVE Final    Comment: (NOTE) SARS-CoV-2 target nucleic acids are NOT DETECTED.  The SARS-CoV-2 RNA is generally detectable in upper respiratory specimens during the acute phase of infection. The lowest concentration of SARS-CoV-2 viral copies this assay can detect is 138 copies/mL. A negative result does not preclude SARS-Cov-2 infection and should not be used as the sole basis for treatment or other patient management decisions. A negative result may occur with  improper specimen collection/handling, submission of specimen other than nasopharyngeal swab, presence of viral mutation(s) within the areas targeted by this assay, and inadequate number of viral copies(<138 copies/mL). A negative result must be combined with clinical observations, patient history, and epidemiological information.  The expected result is Negative.  Fact Sheet for Patients:  EntrepreneurPulse.com.au  Fact Sheet for Healthcare Providers:  IncredibleEmployment.be  This test is no t yet approved or cleared by the [place] FDA and  has been authorized for detection and/or diagnosis of SARS-CoV-2 by FDA under an Emergency Use Authorization (EUA). This EUA will remain  in effect (  meaning this test can be used) for the duration of the COVID-19 declaration under Section 564(b)(1) of the Act, 21 U.S.C.section 360bbb-3(b)(1), unless the authorization is terminated  or revoked sooner.       Influenza A by PCR NEGATIVE NEGATIVE Final   Influenza B by PCR NEGATIVE NEGATIVE Final    Comment: (NOTE) The Xpert Xpress SARS-CoV-2/FLU/RSV plus assay is intended as an aid in the diagnosis of influenza from Nasopharyngeal swab specimens and should not be used as a sole basis for treatment. Nasal washings and aspirates are unacceptable for Xpert Xpress SARS-CoV-2/FLU/RSV testing.  Fact Sheet for Patients: EntrepreneurPulse.com.au  Fact Sheet for Healthcare Providers: IncredibleEmployment.be  This test is not yet approved or cleared by the [place] FDA and has been authorized for detection and/or diagnosis of SARS-CoV-2 by FDA under an Emergency Use Authorization (EUA). This EUA will remain in effect (meaning this test can be used) for the duration of the COVID-19 declaration under Section 564(b)(1) of the Act, 21 U.S.C. section 360bbb-3(b)(1), unless the authorization is terminated or revoked.  Performed at [facility], [place] [address], [place], [place] [address]   Culture, blood (routine x 2)     Status: None (Preliminary result)   Collection Time: 04/06/20  9:15 AM   Specimen: BLOOD  Result Value Ref Range Status   Specimen Description   Final    BLOOD LEFT ANTECUBITAL Performed at [facility] [address] [place], [place] [address]    Special Requests   Final    BOTTLES DRAWN AEROBIC AND ANAEROBIC Blood Culture adequate volume Performed at [facility] [address], [place], [place] [address]    Culture   Final    NO GROWTH 1 DAY Performed at [facility], [place] [address], [place], [place] [address]    Report Status PENDING  Incomplete  MRSA PCR Screening     Status: Abnormal   Collection Time: 04/07/20  1:23 PM   Specimen: Nasal Mucosa; Nasopharyngeal  Result Value Ref Range Status   MRSA by PCR POSITIVE (A) NEGATIVE Final    Comment:        The GeneXpert MRSA Assay (FDA approved for NASAL specimens only), is one component of a comprehensive MRSA colonization surveillance program. It is not intended to diagnose MRSA infection nor to guide or monitor treatment for MRSA infections. RESULT CALLED TO, READ BACK BY AND VERIFIED WITH: TOMPSON,T. RN @1620  04/07/20 BILLINGSLEY,L Performed at [facility], [place] [address], [place], [place] [address]   Aerobic/Anaerobic Culture (surgical/deep wound)     Status: None (Preliminary result)   Collection Time: 04/07/20  7:42 PM   Specimen: Wound; Abscess  Result Value Ref Range Status   Specimen Description   Final    ABSCESS FOOT RIGHT Performed at [facility] [address] [place], [place] [address]    Special Requests   Final    NONE Performed at [facility], [place] [address], [place], [place] [address]    Gram Stain   Final    MODERATE WBC PRESENT, PREDOMINANTLY PMN FEW GRAM POSITIVE COCCI Performed at [facility], [place] [address], [place], [place] [address]    Culture PENDING  Incomplete   Report Status PENDING  Incomplete     Radiology Studies: MR FOOT RIGHT WO CONTRAST  Result Date: 04/07/2020 CLINICAL DATA:  Right foot infection for 2 weeks EXAM: MRI OF THE RIGHT FOREFOOT WITHOUT CONTRAST  TECHNIQUE: Multiplanar, multisequence MR imaging of the right forefoot was performed. No intravenous contrast was administered. COMPARISON:  X-ray 04/06/2020 FINDINGS: Technical note: Motion degraded exam. Bones/Joint/Cartilage Subacute to chronic nondisplaced fracture involving the base of the fourth toe proximal phalanx with extensive marrow edema within the fourth toe base and diaphysis (series 7, image 8). Osseous structures appear otherwise intact without additional fracture. No dislocation. No bony erosion. Mild arthropathy at the hallux sesamoid complex. Small joint effusions of the first, second, third, and fourth MTP joints. Ligaments Intact Lisfranc ligament. Collateral ligaments forefoot appear intact. Muscles and Tendons Diffuse edema-like signal throughout the intrinsic foot musculature suggesting a nonspecific myositis. No intramuscular fluid collection. Flexor and extensor tendons appear intact. No significant tenosynovial fluid collection. Soft tissues Small focal plantar soft tissue ulceration underlying the level of the second metatarsal diaphysis (series 6, image 27). Fluid collection within the underlying subcutaneous soft tissues measuring 3.6 x 0.7 x 1.0 cm (series 7, image 18). There is prominent soft tissue swelling over the dorsum of the forefoot. Small volume fluid within the first, second, and third intermetatarsal spaces. IMPRESSION: 1. Small focal plantar soft tissue ulceration underlying the level of the second metatarsal diaphysis. Fluid collection within the underlying subcutaneous soft tissues measuring 3.6 x 0.7 x 1.0 cm concerning for abscess. 2. Subacute-to-chronic nondisplaced fracture involving the base of the fourth toe proximal phalanx with extensive marrow edema within the underlying fourth toe base and diaphysis. 3. No bone marrow signal changes to suggest osteomyelitis. 4. Small joint effusions of the first, second, third, and fourth MTP joints. Findings are likely reactive  although septic arthritis not entirely excluded. 5. Diffuse edema-like signal throughout the intrinsic foot musculature suggesting a nonspecific myositis. 6. Small volume fluid within the first through third intermetatarsal spaces, which may reflect bursitis. Findings are in agreement with the preliminary report provided by Dr. Francoise Ceo at 9:55 p.m. on 04/06/2020. Electronically Signed   By: Davina Poke D.O.   On: 04/07/2020 08:16    Scheduled Meds: . amLODipine  10 mg Oral Daily  . Chlorhexidine Gluconate Cloth  6 each Topical Q0600  . enoxaparin (LOVENOX) injection  40 mg Subcutaneous Q24H  . insulin aspart  0-15 Units Subcutaneous TID WC  . insulin aspart  0-5 Units Subcutaneous QHS  . insulin glargine  35 Units Subcutaneous QHS  . mupirocin ointment  1 application Nasal BID   Continuous Infusions: . ceFEPime (MAXIPIME) IV 2 g (04/08/20 0538)  . lactated ringers 100 mL/hr at 04/08/20 0009  . vancomycin 166.7 mL/hr at 04/07/20 1100     LOS: 2 days   Time spent: 25 minutes  Lorella Nimrod, MD [facility]  If 7PM-7AM, please contact night-coverage [email or web address]  04/08/2020, 8:32 AM   This record has been created using Systems analyst. Errors have been sought and corrected,but may not always be located. Such creation errors do not reflect on the standard of care.

## 2020-04-08 NOTE — Progress Notes (Signed)
PHARMACY - PHYSICIAN COMMUNICATION CRITICAL VALUE ALERT - BLOOD CULTURE IDENTIFICATION (BCID)  Shirley [name] is an 44 y.o. female who presented to [facility] on 04/06/2020 with a chief complaint of  Chief Complaint  Patient presents with  . Foot Swelling  . Abscess     Assessment:  DFI  Name of physician (or Provider) Contacted: Dr. Reesa Chew  Current antibiotics: vancomycin and cefepime  Changes to prescribed antibiotics recommended:  Patient is on recommended antibiotics - No changes needed  Results for orders placed or performed during the hospital encounter of 04/06/20  Blood Culture ID Panel (Reflexed) (Collected: 04/06/2020  7:24 AM)  Result Value Ref Range   Enterococcus faecalis NOT DETECTED NOT DETECTED   Enterococcus Faecium NOT DETECTED NOT DETECTED   Listeria monocytogenes NOT DETECTED NOT DETECTED   Staphylococcus species DETECTED (A) NOT DETECTED   Staphylococcus aureus (BCID) NOT DETECTED NOT DETECTED   Staphylococcus epidermidis NOT DETECTED NOT DETECTED   Staphylococcus lugdunensis NOT DETECTED NOT DETECTED   Streptococcus species NOT DETECTED NOT DETECTED   Streptococcus agalactiae NOT DETECTED NOT DETECTED   Streptococcus pneumoniae NOT DETECTED NOT DETECTED   Streptococcus pyogenes NOT DETECTED NOT DETECTED   A.calcoaceticus-baumannii NOT DETECTED NOT DETECTED   Bacteroides fragilis NOT DETECTED NOT DETECTED   Enterobacterales NOT DETECTED NOT DETECTED   Enterobacter cloacae complex NOT DETECTED NOT DETECTED   Escherichia coli NOT DETECTED NOT DETECTED   Klebsiella aerogenes NOT DETECTED NOT DETECTED   Klebsiella oxytoca NOT DETECTED NOT DETECTED   Klebsiella pneumoniae NOT DETECTED NOT DETECTED   Proteus species NOT DETECTED NOT DETECTED   Salmonella species NOT DETECTED NOT DETECTED   Serratia marcescens NOT DETECTED NOT DETECTED   Haemophilus influenzae NOT DETECTED NOT DETECTED   Neisseria meningitidis NOT DETECTED NOT DETECTED   Pseudomonas  aeruginosa NOT DETECTED NOT DETECTED   Stenotrophomonas maltophilia NOT DETECTED NOT DETECTED   Candida albicans NOT DETECTED NOT DETECTED   Candida auris NOT DETECTED NOT DETECTED   Candida glabrata NOT DETECTED NOT DETECTED   Candida krusei NOT DETECTED NOT DETECTED   Candida parapsilosis NOT DETECTED NOT DETECTED   Candida tropicalis NOT DETECTED NOT DETECTED   Cryptococcus neoformans/gattii NOT DETECTED NOT DETECTED     Royetta Asal, PharmD, BCPS 04/08/2020 9:03 AM

## 2020-04-08 NOTE — Progress Notes (Addendum)
  Subjective:  Patient ID: Shirley [name], female    DOB: 01-29-1976,  MRN: [id number]  Patient seen bedside. States the wound has been throbbing and hurting today. Has had pain and BP issues throughout the day. Worked with PT today.Otherwise no new complaints. Objective:   Vitals:   04/08/20 1346 04/08/20 1422  BP: (!) 171/92 (!) 154/98  Pulse: 99 (!) 103  Resp: 17   Temp: 98.2 F (36.8 C)   SpO2: 100%    Physical Exam  General: AAO x3, NAD  Wound plantar right foot healing well, no continued purulence noted. Receding cellulitis, less edematous today.  Assessment & Plan:  Patient was evaluated and treated and all questions answered.  Abscess R foot s/p I and D -Dressing removed today. Packing pulled. To be repacked by nursing tonight and daily thereafter. -Cultures pending. Upon results will tailor Abx -Wound superficial without bone exposure, will likely only need 1-2 weeks of abx for ST infection. -Continue PT/OT. -WBAT in surgical shoe. -Patient ok for d/c upon clinical improvement and culture results.  Evelina Bucy, DPM  Best accessible via secure chat for questions or concerns.

## 2020-04-08 NOTE — Progress Notes (Signed)
Pt states that she will notify RT if she decides to wear CPAP tonight.  Machine is at bedside.

## 2020-04-09 LAB — GLUCOSE, CAPILLARY
Glucose-Capillary: 186 mg/dL — ABNORMAL HIGH (ref 70–99)
Glucose-Capillary: 156 mg/dL — ABNORMAL HIGH (ref 70–99)
Glucose-Capillary: 167 mg/dL — ABNORMAL HIGH (ref 70–99)

## 2020-04-09 LAB — CULTURE, BLOOD (ROUTINE X 2): Special Requests: ADEQUATE

## 2020-04-09 LAB — CBC
HCT: 32 % — ABNORMAL LOW (ref 36.0–46.0)
Hemoglobin: 10.9 g/dL — ABNORMAL LOW (ref 12.0–15.0)
MCH: 27.7 pg (ref 26.0–34.0)
MCHC: 34.1 g/dL (ref 30.0–36.0)
MCV: 81.4 fL (ref 80.0–100.0)
Platelets: 396 10*3/uL (ref 150–400)
RBC: 3.93 MIL/uL (ref 3.87–5.11)
RDW: 14.4 % (ref 11.5–15.5)
WBC: 13.4 10*3/uL — ABNORMAL HIGH (ref 4.0–10.5)
nRBC: 0 % (ref 0.0–0.2)

## 2020-04-09 LAB — BASIC METABOLIC PANEL
Anion gap: 10 (ref 5–15)
BUN: 16 mg/dL (ref 6–20)
CO2: 22 mmol/L (ref 22–32)
Calcium: 8.3 mg/dL — ABNORMAL LOW (ref 8.9–10.3)
Chloride: 103 mmol/L (ref 98–111)
Creatinine, Ser: 1.25 mg/dL — ABNORMAL HIGH (ref 0.44–1.00)
GFR, Estimated: 54 mL/min — ABNORMAL LOW (ref >60)
Glucose, Bld: 201 mg/dL — ABNORMAL HIGH (ref 70–99)
Potassium: 3.6 mmol/L (ref 3.5–5.1)
Sodium: 135 mmol/L (ref 135–145)

## 2020-04-09 MED ORDER — LINEZOLID 600 MG PO TABS: 600.0000 mg | ORAL_TABLET | Freq: Two times a day (BID) | ORAL | 0 refills | Status: AC

## 2020-04-09 MED ORDER — HYDROCODONE-ACETAMINOPHEN 7.5-325 MG PO TABS
1.0000 | ORAL_TABLET | Freq: Four times a day (QID) | ORAL | Status: DC | PRN
Start: 2020-04-09 — End: 2020-04-09
  Administered 2020-04-09: 1 via ORAL
  Filled 2020-04-09: qty 1

## 2020-04-09 MED FILL — Insulin Aspart Inj 100 Unit/ML: SUBCUTANEOUS | Qty: 0.02 | Status: AC

## 2020-04-09 NOTE — Progress Notes (Signed)
Discharge instructions given to patient. Denies questions. Advised to return to ED if s/s of infection are present. Patient educated on s/s of infection

## 2020-04-09 NOTE — Evaluation (Signed)
Physical Therapy Evaluation Patient Details Name: Shirley [name] MRN: [id number] DOB: 10-09-1975 Today's Date: 04/09/2020   History of Present Illness  Shirley [name] is a 44 y.o. female with medical history significant of DM2, HTN. Presenting with right foot pain and swelling that started 2.5 weeks ago now s/p I &D of R foot.    Clinical Impression  Latesha Chesney [place] is 45 y.o. female admitted with above HPI and diagnosis now POD 2 s/p I&D of Rt foot. Patient is currently limited by functional impairments below (see PT problem list). Patient lives with her sig. other and grandchild and is independent at baseline. She has a 2nd floor apartment and one hand rail to ascend the flight of stairs. Patient currently requires min guard for sit<>stand from elevated surfaces and min guard for safety with gait using RW. She is limited by pain in Rt foot. Patient will benefit from continued skilled PT interventions to address impairments and progress independence with mobility, recommending HHPT if pt is able to progress gait to stair training with acute PT for safe discharge home. Acute PT will follow and progress as able.     Follow Up Recommendations Home health PT    Equipment Recommendations  Rolling walker with 5" wheels    Recommendations for Other Services       Precautions / Restrictions Precautions Precautions: None Restrictions Weight Bearing Restrictions: No RLE Weight Bearing: Weight bearing as tolerated Other Position/Activity Restrictions: Through Heel with Post Op Shoe      Mobility  Bed Mobility Overal bed mobility: Modified Independent             General bed mobility comments: HOB elevated, pt requires increased time.    Transfers Overall transfer level: Needs assistance Equipment used: Rolling walker (2 wheeled) Transfers: Sit to/from Stand Sit to Stand: Min guard;Supervision;From elevated surface         General transfer comment: pt unable to initiate  power up from EOB at low height. min guard/supervision for power up from elevated height and cues for safe hand placement on RW. pt using bil grab bars for power up from toilet.  Ambulation/Gait Ambulation/Gait assistance: Min guard Gait Distance (Feet): 60 Feet Assistive device: Rolling walker (2 wheeled) Gait Pattern/deviations: Step-to pattern;Decreased stride length;Decreased stance time - right;Decreased weight shift to right Gait velocity: decr   General Gait Details: VC's for proximity to RW as pt has some tendency to move walker too far ahead. no overt LOB noted. pt took ~ 4 short standing rest breaks due to back pain and foot pain.  Stairs            Wheelchair Mobility    Modified Rankin (Stroke Patients Only)       Balance Overall balance assessment: Needs assistance Sitting-balance support: Feet supported Sitting balance-Leahy Scale: Good     Standing balance support: During functional activity;Bilateral upper extremity supported Standing balance-Leahy Scale: Fair                               Pertinent Vitals/Pain Pain Assessment: 0-10 Pain Score: 8  Pain Location: Rt foot Pain Descriptors / Indicators: Aching;Discomfort;Shooting Pain Intervention(s): Limited activity within patient's tolerance;Monitored during session;Repositioned;Ice applied;Premedicated before session    Home Living Family/patient expects to be discharged to:: Private residence Living Arrangements: Spouse/significant other (granchild) Available Help at Discharge: Family Type of Home: Apartment Home Access: Stairs to enter Entrance Stairs-Rails: Left Entrance Stairs-Number of Steps:  15 Home Layout: One level Home Equipment: None      Prior Function Level of Independence: Independent               Hand Dominance        Extremity/Trunk Assessment   Upper Extremity Assessment Upper Extremity Assessment: Overall WFL for tasks assessed    Lower Extremity  Assessment Lower Extremity Assessment: Generalized weakness;RLE deficits/detail RLE Deficits / Details: pt c/o pain wiht Rt foot, dressing intact on Rt foot, no visible drainage. pt able to wiggle toes. RLE Sensation: history of peripheral neuropathy RLE Coordination: WNL    Cervical / Trunk Assessment Cervical / Trunk Assessment: Normal  Communication   Communication: No difficulties  Cognition Arousal/Alertness: Awake/alert Behavior During Therapy: WFL for tasks assessed/performed Overall Cognitive Status: Within Functional Limits for tasks assessed                                        General Comments      Exercises     Assessment/Plan    PT Assessment Patient needs continued PT services  PT Problem List Decreased strength;Decreased range of motion;Decreased activity tolerance;Decreased balance;Decreased mobility;Decreased knowledge of use of DME;Decreased knowledge of precautions       PT Treatment Interventions DME instruction;Gait training;Stair training;Functional mobility training;Therapeutic activities;Therapeutic exercise;Balance training;Patient/family education    PT Goals (Current goals can be found in the Care Plan section)  Acute Rehab PT Goals Patient Stated Goal: to get home PT Goal Formulation: With patient Time For Goal Achievement: 04/16/20 Potential to Achieve Goals: Good    Frequency 7X/week   Barriers to discharge        Co-evaluation               AM-PAC PT "6 Clicks" Mobility  Outcome Measure Help needed turning from your back to your side while in a flat bed without using bedrails?: None Help needed moving from lying on your back to sitting on the side of a flat bed without using bedrails?: None Help needed moving to and from a bed to a chair (including a wheelchair)?: A Bethard Help needed standing up from a chair using your arms (e.g., wheelchair or bedside chair)?: A Taffe Help needed to walk in hospital room?:  A Scardino Help needed climbing 3-5 steps with a railing? : A Lot 6 Click Score: 19    End of Session Equipment Utilized During Treatment: Gait belt Activity Tolerance: Patient tolerated treatment well Patient left: in chair;with call bell/phone within reach Nurse Communication: Mobility status PT Visit Diagnosis: Muscle weakness (generalized) (M62.81);Difficulty in walking, not elsewhere classified (R26.2)    Time: 1884-1660 PT Time Calculation (min) (ACUTE ONLY): 33 min   Charges:   PT Evaluation $PT Eval Low Complexity: 1 Low PT Treatments $Gait Training: 8-22 mins        Verner Mould, DPT Acute Rehabilitation Services Office [phone] Pager [phone]    Jacques Navy 04/09/2020, 12:59 PM

## 2020-04-09 NOTE — Discharge Summary (Signed)
Physician Discharge Summary  Shirley [name] VXB:[id number] DOB: [birth year] DOA: 04/06/2020  PCP: Fredrich Romans, PA  Admit date: 04/06/2020 Discharge date: 04/09/2020  Admitted From: Home Disposition: Home   Recommendations for Outpatient Follow-up:  1. Follow up with PCP in 1-2 weeks 2. Follow-up with podiatry 3. Please obtain BMP/CBC in one week 4. Please follow up on the following pending results: Culture susceptibility results  Home Health: Yes Equipment/Devices: Rolling walker Discharge Condition: Stable CODE STATUS: Full Diet recommendation: Heart Healthy / Carb Modified  Brief/Interim Summary: Shirley [name] a 44 y.o.femalewith medical history significant ofDM2, HTN. Presenting with right foot pain and swelling that started 2.5 weeks ago. She initially thought it was just painful calloses, but she had progressive swelling and discoloration of the bottom of her foot to her ankles over the next couple of weeks. She treat percocet for the pain, but it did not help. She denies fevers, drainage. She saw her podiatrist yesterday and was given the recommendation to come to the hospital. XR showed no signs of osteo.  MRI with abscess and marrow edema.  Podiatry took her to the OR for incision and drainage, wound culture growing staph aureus, she received vancomycin and cefepime initially in the hospital, cefepime discontinued after initial culture results of staph aureus and she was discharged on linezolid as final susceptibility results were still pending and patient want to go home. Patient will need a daily dressing change, home health services arranged. She will be weightbearing and a surgical boot and walker was provided.  Patient has uncontrolled diabetes with A1c of 12.3 and hyperglycemia.  She was not very compliant with her home antihypertensives and antidiabetic medications.  She was counseled for compliance and advised to continue her medications regularly and  follow-up with her primary care provider closely for better control of diabetes and hypertension in order to avoid further complications. Patient has an history of CKD stage IIIb.  Creatinine remained stable at her baseline.  Patient also has an history of obstructive sleep apnea and was using CPAP which she lost during a move.  She needs to work with her primary care provider and home health supply company to get a new as she desaturated while sleeping and needs to use CPAP regularly.  She will continue with rest of her pulmonary meds and follow-up with her providers.  Discharge Diagnoses:  Active Problems:   Diabetic foot infection ([place])   Abscess of bursa of right foot   Discharge Instructions  Discharge Instructions    Diet - low sodium heart healthy   Complete by: As directed    Discharge instructions   Complete by: As directed    It was pleasure taking care of you. It is very important that you take all of your blood pressure and diabetes medication to keep your blood pressure and diabetes under good control to avoid further complications. You are being given a prescription for 2 weeks of antibiotics, please take it as directed and follow-up with your surgeon. Keep your wound clean and do daily dressing change as directed.   Discharge wound care:   Complete by: As directed    Cleanse wound. Flush wound with saline flush. Gently pack wound with 1/4 inch Iodoform packing. Cover with gauze, kerlix, ACE bandage. Change daily.   Increase activity slowly   Complete by: As directed      Allergies as of 04/09/2020      Reactions   Lisinopril Nausea Only, Swelling   Morphine And Related  Itching, Swelling      Medication List    STOP taking these medications   doxycycline 100 MG capsule Commonly known as: VIBRAMYCIN   sitaGLIPtin 100 MG tablet Commonly known as: Januvia     TAKE these medications   albuterol 108 (90 Base) MCG/ACT inhaler Commonly known as: VENTOLIN  HFA Inhale 2 puffs into the lungs every 4 (four) hours as needed for wheezing or shortness of breath.   amLODipine 10 MG tablet Commonly known as: NORVASC Take 10 mg by mouth daily.   blood glucose meter kit and supplies Kit Dispense based on patient and insurance preference. Use up to four times daily as directed. (FOR ICD-9 250.00, 250.01).   cyclobenzaprine 5 MG tablet Commonly known as: FLEXERIL Take 5-10 mg by mouth 3 (three) times daily as needed for muscle spasms.   HumaLOG KwikPen 100 UNIT/ML KwikPen Generic drug: insulin lispro Inject 7 Units into the skin 4 (four) times daily -  before meals and at bedtime. Hold if blood glucose is less than 110   INSULIN SYRINGE .5CC/28G 28G X 1/2" 0.5 ML Misc Please provide 1 month supply   Lantus SoloStar 100 UNIT/ML Solostar Pen Generic drug: insulin glargine Inject 30 Units into the skin daily. What changed: how much to take   levocetirizine 5 MG tablet Commonly known as: Xyzal Take 1 tablet (5 mg total) by mouth every evening.   linezolid 600 MG tablet Commonly known as: ZYVOX Take 1 tablet (600 mg total) by mouth 2 (two) times daily for 12 days.   losartan-hydrochlorothiazide 100-25 MG tablet Commonly known as: HYZAAR Take 1 tablet by mouth daily.   ondansetron 4 MG disintegrating tablet Commonly known as: Zofran ODT Take 1 tablet (4 mg total) by mouth every 8 (eight) hours as needed for nausea or vomiting.   OneTouch Delica Lancets 91Q Misc Use to check blood sugar 3 times daily. Dx codes: E11.8, E11.65   OneTouch Verio Reflect w/Device Kit 1 each by Does not apply route 3 (three) times daily.   OneTouch Verio test strip Generic drug: glucose blood Use as instructed to check blood sugar 3 times daily. Dx codes: E11.8, E11.65   oxyCODONE-acetaminophen 10-325 MG tablet Commonly known as: PERCOCET Take 1 tablet by mouth 4 (four) times daily as needed for pain.   Pen Needles 3/16" 31G X 5 MM Misc 1 Device by Does  not apply route 4 (four) times daily - after meals and at bedtime.   pravastatin 80 MG tablet Commonly known as: PRAVACHOL Take 80 mg by mouth daily.   traMADol 50 MG tablet Commonly known as: ULTRAM Take 1 tablet (50 mg total) by mouth every 6 (six) hours as needed.   traZODone 50 MG tablet Commonly known as: DESYREL Take 0.5-1 tablets (25-50 mg total) by mouth at bedtime as needed for sleep.   Trulicity 4.5 XI/5.0TU Sopn Generic drug: Dulaglutide Inject 4.5 mg into the skin once a week.            Durable Medical Equipment  (From admission, onward)         Start     Ordered   04/09/20 1355  For home use only DME Walker rolling  Once       Question Answer Comment  Walker: With 5 Inch Wheels   Patient needs a walker to treat with the following condition Diabetic foot infection ([place])      04/09/20 1354           Discharge  Care Instructions  (From admission, onward)         Start     Ordered   04/09/20 0000  Discharge wound care:       Comments: Cleanse wound. Flush wound with saline flush. Gently pack wound with 1/4 inch Iodoform packing. Cover with gauze, kerlix, ACE bandage. Change daily.   04/09/20 1351          Follow-up Information    Fredrich Romans, [place]. Schedule an appointment as soon as possible for a visit in 1 week(s).   Specialty: Physician Assistant Contact information: [address] [place] [address] [phone]        Evelina Bucy, DPM Follow up in 1 week(s).   Specialty: Podiatry Contact information: [address] [place] [address] [phone]              Allergies  Allergen Reactions  . Lisinopril Nausea Only and Swelling  . Morphine And Related Itching and Swelling    Consultations:  Podiatry  Procedures/Studies: MR FOOT RIGHT WO CONTRAST  Result Date: 04/07/2020 CLINICAL DATA:  Right foot infection for 2 weeks EXAM: MRI OF THE RIGHT FOREFOOT WITHOUT CONTRAST TECHNIQUE: Multiplanar,  multisequence MR imaging of the right forefoot was performed. No intravenous contrast was administered. COMPARISON:  X-ray 04/06/2020 FINDINGS: Technical note: Motion degraded exam. Bones/Joint/Cartilage Subacute to chronic nondisplaced fracture involving the base of the fourth toe proximal phalanx with extensive marrow edema within the fourth toe base and diaphysis (series 7, image 8). Osseous structures appear otherwise intact without additional fracture. No dislocation. No bony erosion. Mild arthropathy at the hallux sesamoid complex. Small joint effusions of the first, second, third, and fourth MTP joints. Ligaments Intact Lisfranc ligament. Collateral ligaments forefoot appear intact. Muscles and Tendons Diffuse edema-like signal throughout the intrinsic foot musculature suggesting a nonspecific myositis. No intramuscular fluid collection. Flexor and extensor tendons appear intact. No significant tenosynovial fluid collection. Soft tissues Small focal plantar soft tissue ulceration underlying the level of the second metatarsal diaphysis (series 6, image 27). Fluid collection within the underlying subcutaneous soft tissues measuring 3.6 x 0.7 x 1.0 cm (series 7, image 18). There is prominent soft tissue swelling over the dorsum of the forefoot. Small volume fluid within the first, second, and third intermetatarsal spaces. IMPRESSION: 1. Small focal plantar soft tissue ulceration underlying the level of the second metatarsal diaphysis. Fluid collection within the underlying subcutaneous soft tissues measuring 3.6 x 0.7 x 1.0 cm concerning for abscess. 2. Subacute-to-chronic nondisplaced fracture involving the base of the fourth toe proximal phalanx with extensive marrow edema within the underlying fourth toe base and diaphysis. 3. No bone marrow signal changes to suggest osteomyelitis. 4. Small joint effusions of the first, second, third, and fourth MTP joints. Findings are likely reactive although septic  arthritis not entirely excluded. 5. Diffuse edema-like signal throughout the intrinsic foot musculature suggesting a nonspecific myositis. 6. Small volume fluid within the first through third intermetatarsal spaces, which may reflect bursitis. Findings are in agreement with the preliminary report provided by Dr. Francoise Ceo at 9:55 p.m. on 04/06/2020. Electronically Signed   By: Davina Poke D.O.   On: 04/07/2020 08:16   DG Foot Complete Right  Result Date: 04/06/2020 CLINICAL DATA:  Soft tissue infection EXAM: RIGHT FOOT COMPLETE - 3+ VIEW COMPARISON:  None. FINDINGS: Generalized soft tissue swelling is noted about the metatarsals consistent with the given clinical history. No bony erosive changes are seen. No fracture or dislocation is noted. Small calcaneal spurs  are noted. IMPRESSION: Soft tissue swelling consistent with the given clinical history. No acute bony abnormality is noted. Electronically Signed   By: Inez Catalina M.D.   On: 04/06/2020 08:01   DG Foot Complete Right  Result Date: 03/27/2020 CLINICAL DATA:  Localized right foot pain and swelling with lump on the plantar surface EXAM: RIGHT FOOT COMPLETE - 3+ VIEW COMPARISON:  None. FINDINGS: Focal soft tissue swelling is noted towards the level of the forefoot. This on a background of more diffuse mild edematous changes. Benign soft tissue mineralization seen anterior to the tibia. No clear soft tissue gas or foreign body is seen. Irregularity of the first nail plate, indeterminate, correlate with exam. There is some persistent lucency and callus formation about the base of the fourth proximal phalanx which suggests incomplete healing of a previously seen fracture. No acute fracture or traumatic osseous injury is identified. Tiny ossicle at the first metatarsophalangeal joint separate from the hallucal sesamoids. Plantar calcaneal spurring is noted. IMPRESSION: 1. Focal soft tissue swelling towards the level of the forefoot. No clear soft  tissue gas or foreign body. 2. Irregularity of the first nail plate, indeterminate, correlate with exam. 3. Some persistent lucency and callus formation about the base of the fourth proximal phalanx which suggests incomplete healing of a previously seen fracture. 4. No new acute osseous abnormality. Electronically Signed   By: Lovena Le M.D.   On: 03/27/2020 17:53     Subjective: Patient was feeling [place] better when seen today.  She wants to go home.  Stating that her boyfriend can help with her dressing change.  Discharge Exam: Vitals:   04/09/20 1117 04/09/20 1311  BP: (!) 160/77 (!) 184/94  Pulse: 97 86  Resp:  17  Temp:  98 F (36.7 C)  SpO2:     Vitals:   04/08/20 2145 04/09/20 0534 04/09/20 1117 04/09/20 1311  BP: (!) 147/83 (!) 149/78 (!) 160/77 (!) 184/94  Pulse: 93 90 97 86  Resp: _0 Temp: 98.1 F (36.7 C) 98.1 F (36.7 C)  98 F (36.7 C)  TempSrc:      SpO2: 100% (!) 87%    Weight:      Height:        General: Pt is alert, awake, not in acute distress Cardiovascular: RRR, S1/S2 +, no rubs, no gallops Respiratory: CTA bilaterally, no wheezing, no rhonchi Abdominal: Soft, NT, ND, bowel sounds + Extremities: no edema, no cyanosis, right foot with Ace wrap.   The results of significant diagnostics from this hospitalization (including imaging, microbiology, ancillary and laboratory) are listed below for reference.    Microbiology: Recent Results (from the past 240 hour(s))  Culture, blood (routine x 2)     Status: Abnormal   Collection Time: 04/06/20  7:24 AM   Specimen: BLOOD  Result Value Ref Range Status   Specimen Description   Final    BLOOD RIGHT ANTECUBITAL Performed at [facility] [address], [place], [place] [address]    Special Requests   Final    BOTTLES DRAWN AEROBIC AND ANAEROBIC Blood Culture adequate volume Performed at [facility] [address], [place], [place] [address]    Culture   Setup Time   Final    AEROBIC BOTTLE ONLY GRAM POSITIVE COCCI Organism ID to follow CRITICAL RESULT CALLED TO, READ BACK BY AND VERIFIED WITH: Christean Grief PharmD 8:15 04/08/20 ([email or web address])    Culture (A)  Final  STAPHYLOCOCCUS HOMINIS THE SIGNIFICANCE OF ISOLATING THIS ORGANISM FROM A SINGLE SET OF BLOOD CULTURES WHEN MULTIPLE SETS ARE DRAWN IS UNCERTAIN. PLEASE NOTIFY THE MICROBIOLOGY DEPARTMENT WITHIN ONE WEEK IF SPECIATION AND SENSITIVITIES ARE REQUIRED. Performed at [facility], [place] [address], [place], [place] [address]    Report Status 04/09/2020 FINAL  Final  Blood Culture ID Panel (Reflexed)     Status: Abnormal   Collection Time: 04/06/20  7:24 AM  Result Value Ref Range Status   Enterococcus faecalis NOT DETECTED NOT DETECTED Final   Enterococcus Faecium NOT DETECTED NOT DETECTED Final   Listeria monocytogenes NOT DETECTED NOT DETECTED Final   Staphylococcus species DETECTED (A) NOT DETECTED Final    Comment: CRITICAL RESULT CALLED TO, READ BACK BY AND VERIFIED WITH: Christean Grief PharmD 8:15 04/08/20 ([email or web address])    Staphylococcus aureus (BCID) NOT DETECTED NOT DETECTED Final   Staphylococcus epidermidis NOT DETECTED NOT DETECTED Final   Staphylococcus lugdunensis NOT DETECTED NOT DETECTED Final   Streptococcus species NOT DETECTED NOT DETECTED Final   Streptococcus agalactiae NOT DETECTED NOT DETECTED Final   Streptococcus pneumoniae NOT DETECTED NOT DETECTED Final   Streptococcus pyogenes NOT DETECTED NOT DETECTED Final   A.calcoaceticus-baumannii NOT DETECTED NOT DETECTED Final   Bacteroides fragilis NOT DETECTED NOT DETECTED Final   Enterobacterales NOT DETECTED NOT DETECTED Final   Enterobacter cloacae complex NOT DETECTED NOT DETECTED Final   Escherichia coli NOT DETECTED NOT DETECTED Final   Klebsiella aerogenes NOT DETECTED NOT DETECTED Final   Klebsiella oxytoca NOT DETECTED NOT DETECTED Final   Klebsiella pneumoniae NOT DETECTED NOT DETECTED Final   Proteus  species NOT DETECTED NOT DETECTED Final   Salmonella species NOT DETECTED NOT DETECTED Final   Serratia marcescens NOT DETECTED NOT DETECTED Final   Haemophilus influenzae NOT DETECTED NOT DETECTED Final   Neisseria meningitidis NOT DETECTED NOT DETECTED Final   Pseudomonas aeruginosa NOT DETECTED NOT DETECTED Final   Stenotrophomonas maltophilia NOT DETECTED NOT DETECTED Final   Candida albicans NOT DETECTED NOT DETECTED Final   Candida auris NOT DETECTED NOT DETECTED Final   Candida glabrata NOT DETECTED NOT DETECTED Final   Candida krusei NOT DETECTED NOT DETECTED Final   Candida parapsilosis NOT DETECTED NOT DETECTED Final   Candida tropicalis NOT DETECTED NOT DETECTED Final   Cryptococcus neoformans/gattii NOT DETECTED NOT DETECTED Final    Comment: Performed at [facility], [address] [place], [place] [address]  Resp Panel by RT-PCR (Flu A&B, Covid) Nasopharyngeal Swab     Status: None   Collection Time: 04/06/20  8:57 AM   Specimen: Nasopharyngeal Swab; Nasopharyngeal(NP) swabs in vial transport medium  Result Value Ref Range Status   SARS Coronavirus 2 by RT PCR NEGATIVE NEGATIVE Final    Comment: (NOTE) SARS-CoV-2 target nucleic acids are NOT DETECTED.  The SARS-CoV-2 RNA is generally detectable in upper respiratory specimens during the acute phase of infection. The lowest concentration of SARS-CoV-2 viral copies this assay can detect is 138 copies/mL. A negative result does not preclude SARS-Cov-2 infection and should not be used as the sole basis for treatment or other patient management decisions. A negative result may occur with  improper specimen collection/handling, submission of specimen other than nasopharyngeal swab, presence of viral mutation(s) within the areas targeted by this assay, and inadequate number of viral copies(<138 copies/mL). A negative result must be combined with clinical observations, patient history, and epidemiological information.  The expected result is Negative.  Fact Sheet for Patients:  EntrepreneurPulse.com.au  Fact Sheet for Healthcare Providers:  IncredibleEmployment.be  This test is no t yet approved or cleared by the [place] FDA and  has been authorized for detection and/or diagnosis of SARS-CoV-2 by FDA under an Emergency Use Authorization (EUA). This EUA will remain  in effect (meaning this test can be used) for the duration of the COVID-19 declaration under Section 564(b)(1) of the Act, 21 U.S.C.section 360bbb-3(b)(1), unless the authorization is terminated  or revoked sooner.       Influenza A by PCR NEGATIVE NEGATIVE Final   Influenza B by PCR NEGATIVE NEGATIVE Final    Comment: (NOTE) The Xpert Xpress SARS-CoV-2/FLU/RSV plus assay is intended as an aid in the diagnosis of influenza from Nasopharyngeal swab specimens and should not be used as a sole basis for treatment. Nasal washings and aspirates are unacceptable for Xpert Xpress SARS-CoV-2/FLU/RSV testing.  Fact Sheet for Patients: EntrepreneurPulse.com.au  Fact Sheet for Healthcare Providers: IncredibleEmployment.be  This test is not yet approved or cleared by the [place] FDA and has been authorized for detection and/or diagnosis of SARS-CoV-2 by FDA under an Emergency Use Authorization (EUA). This EUA will remain in effect (meaning this test can be used) for the duration of the COVID-19 declaration under Section 564(b)(1) of the Act, 21 U.S.C. section 360bbb-3(b)(1), unless the authorization is terminated or revoked.  Performed at [facility], [place] [address] [place], [place] [address]   Culture, blood (routine x 2)     Status: None (Preliminary result)   Collection Time: 04/06/20  9:15 AM   Specimen: BLOOD  Result Value Ref Range Status   Specimen Description   Final    BLOOD LEFT ANTECUBITAL Performed at [facility] [address], [place], [place] [address]    Special Requests   Final    BOTTLES DRAWN AEROBIC AND ANAEROBIC Blood Culture adequate volume Performed at [facility] [address], [place], [place] [address]    Culture   Final    NO GROWTH 3 DAYS Performed at [facility], [place] [address], [place], [place] [address]    Report Status PENDING  Incomplete  MRSA PCR Screening     Status: Abnormal   Collection Time: 04/07/20  1:23 PM   Specimen: Nasal Mucosa; Nasopharyngeal  Result Value Ref Range Status   MRSA by PCR POSITIVE (A) NEGATIVE Final    Comment:        The GeneXpert MRSA Assay (FDA approved for NASAL specimens only), is one component of a comprehensive MRSA colonization surveillance program. It is not intended to diagnose MRSA infection nor to guide or monitor treatment for MRSA infections. RESULT CALLED TO, READ BACK BY AND VERIFIED WITH: TOMPSON,T. RN _0  04/07/20 BILLINGSLEY,L Performed at [facility], [place] [address] [place], [place] [address]   Aerobic/Anaerobic Culture (surgical/deep wound)     Status: None (Preliminary result)   Collection Time: 04/07/20  7:42 PM   Specimen: Wound; Abscess  Result Value Ref Range Status   Specimen Description   Final    ABSCESS FOOT RIGHT Performed at [facility] [address] [place], [place] [address]    Special Requests   Final    NONE Performed at [facility], [place] [address], [place], [place] [address]    Gram Stain   Final    MODERATE WBC PRESENT, PREDOMINANTLY PMN FEW GRAM POSITIVE COCCI Performed at [facility], [place]  [address] [place], [place] [address]    Culture ABUNDANT STAPHYLOCOCCUS AUREUS  Final   Report Status PENDING  Incomplete     Labs: BNP (last 3 results) No results for input(s): BNP in the last 8760 hours. Basic Metabolic Panel: Recent Labs  Lab 04/05/20 2246 04/07/20 0122  04/07/20 0528 04/08/20 0510 04/09/20 0531  NA 132* 136 139  --  135  K 3.8 3.6 4.0  --  3.6  CL 101 103 105  --  103  CO2 _0 --  22  GLUCOSE 359* 299* 147*  --  201*  BUN _1 --  16  CREATININE 1.30* 1.26* 1.21* 1.26* 1.25*  CALCIUM 8.7* 8.5* 8.4*  --  8.3*  MG  --  2.0  --   --   --    Liver Function Tests: Recent Labs  Lab 04/05/20 2246 04/07/20 0122 04/07/20 0528  AST 15 9* 12*  ALT _2 ALKPHOS 61 60 52  BILITOT 0.3 0.3 0.2*  PROT 7.1 6.6 6.3*  ALBUMIN 2.7* 2.4* 2.4*   No results for input(s): LIPASE, AMYLASE in the last 168 hours. No results for input(s): AMMONIA in the last 168 hours. CBC: Recent Labs  Lab 04/05/20 2246 04/07/20 0122 04/07/20 0528 04/09/20 0531  WBC 14.1* 12.1* 11.7* 13.4*  NEUTROABS 9.6* 7.1  --   --   HGB 11.9* 11.8* 11.6* 10.9*  HCT 34.6* 34.0* 33.3* 32.0*  MCV 79.4* 79.8* 79.3* 81.4  PLT 372 408* 408* 396   Cardiac Enzymes: No results for input(s): CKTOTAL, CKMB, CKMBINDEX, TROPONINI in the last 168 hours. BNP: Invalid input(s): POCBNP CBG: Recent Labs  Lab 04/08/20 1151 04/08/20 1646 04/08/20 2146 04/09/20 0735 04/09/20 1147  GLUCAP 185* 190* 228* 186* 156*   D-Dimer No results for input(s): DDIMER in the last 72 hours. Hgb A1c Recent Labs    04/07/20 0122  HGBA1C 12.3*   Lipid Profile No results for input(s): CHOL, HDL, LDLCALC, TRIG, CHOLHDL, LDLDIRECT in the last 72 hours. Thyroid function studies No results for input(s): TSH, T4TOTAL, T3FREE, THYROIDAB in the last 72 hours.  Invalid input(s): FREET3 Anemia work up No results for input(s): VITAMINB12, FOLATE, FERRITIN, TIBC, IRON, RETICCTPCT in the last 72 hours. Urinalysis    Component Value Date/Time   COLORURINE YELLOW 01/03/2020 2045   APPEARANCEUR TURBID (A) 01/03/2020 2045   LABSPEC 1.025 01/03/2020 2045   PHURINE 6.5 01/03/2020 2045   GLUCOSEU >=500 (A) 01/03/2020 2045   GLUCOSEU > 1000 mg/dL (A) 07/07/2008 2115   HGBUR SMALL (A)  01/03/2020 2045   HGBUR negative 08/12/2008 1444   BILIRUBINUR SMALL (A) 01/03/2020 2045   BILIRUBINUR neg 02/07/2017 1609   KETONESUR 15 (A) 01/03/2020 2045   PROTEINUR >300 (A) 01/03/2020 2045   UROBILINOGEN 1.0 02/07/2017 1609   UROBILINOGEN 0.2 11/11/2014 1658   NITRITE NEGATIVE 01/03/2020 2045   LEUKOCYTESUR NEGATIVE 01/03/2020 2045   Sepsis Labs Invalid input(s): PROCALCITONIN,  WBC,  LACTICIDVEN Microbiology Recent Results (from the past 240 hour(s))  Culture, blood (routine x 2)     Status: Abnormal   Collection Time: 04/06/20  7:24 AM   Specimen: BLOOD  Result Value Ref Range Status   Specimen Description   Final    BLOOD RIGHT ANTECUBITAL Performed at [facility], [place] [address], [place], [place] [address]    Special Requests   Final    BOTTLES DRAWN AEROBIC AND ANAEROBIC Blood Culture adequate volume Performed at [facility], [place] [address], [place], [place] [address]    Culture  Setup Time   Final    AEROBIC BOTTLE ONLY GRAM POSITIVE COCCI Organism ID to follow CRITICAL RESULT CALLED TO, READ BACK BY AND VERIFIED WITH: Christean Grief PharmD 8:15 04/08/20 ([email or web address])    Culture (A)  Final    STAPHYLOCOCCUS HOMINIS THE SIGNIFICANCE OF ISOLATING THIS ORGANISM FROM A SINGLE SET OF BLOOD CULTURES WHEN MULTIPLE SETS ARE DRAWN IS UNCERTAIN. PLEASE NOTIFY THE MICROBIOLOGY DEPARTMENT WITHIN ONE WEEK IF SPECIATION AND SENSITIVITIES ARE REQUIRED. Performed at [facility], [place] [address], [place], [place] [address]    Report Status 04/09/2020 FINAL  Final  Blood Culture ID Panel (Reflexed)     Status: Abnormal   Collection Time: 04/06/20  7:24 AM  Result Value Ref Range Status   Enterococcus faecalis NOT DETECTED NOT DETECTED Final   Enterococcus Faecium NOT DETECTED NOT DETECTED Final   Listeria monocytogenes NOT DETECTED NOT DETECTED Final   Staphylococcus species DETECTED (A) NOT DETECTED Final    Comment: CRITICAL RESULT CALLED  TO, READ BACK BY AND VERIFIED WITH: Christean Grief PharmD 8:15 04/08/20 ([email or web address])    Staphylococcus aureus (BCID) NOT DETECTED NOT DETECTED Final   Staphylococcus epidermidis NOT DETECTED NOT DETECTED Final   Staphylococcus lugdunensis NOT DETECTED NOT DETECTED Final   Streptococcus species NOT DETECTED NOT DETECTED Final   Streptococcus agalactiae NOT DETECTED NOT DETECTED Final   Streptococcus pneumoniae NOT DETECTED NOT DETECTED Final   Streptococcus pyogenes NOT DETECTED NOT DETECTED Final   A.calcoaceticus-baumannii NOT DETECTED NOT DETECTED Final   Bacteroides fragilis NOT DETECTED NOT DETECTED Final   Enterobacterales NOT DETECTED NOT DETECTED Final   Enterobacter cloacae complex NOT DETECTED NOT DETECTED Final   Escherichia coli NOT DETECTED NOT DETECTED Final   Klebsiella aerogenes NOT DETECTED NOT DETECTED Final   Klebsiella oxytoca NOT DETECTED NOT DETECTED Final   Klebsiella pneumoniae NOT DETECTED NOT DETECTED Final   Proteus species NOT DETECTED NOT DETECTED Final   Salmonella species NOT DETECTED NOT DETECTED Final   Serratia marcescens NOT DETECTED NOT DETECTED Final   Haemophilus influenzae NOT DETECTED NOT DETECTED Final   Neisseria meningitidis NOT DETECTED NOT DETECTED Final   Pseudomonas aeruginosa NOT DETECTED NOT DETECTED Final   Stenotrophomonas maltophilia NOT DETECTED NOT DETECTED Final   Candida albicans NOT DETECTED NOT DETECTED Final   Candida auris NOT DETECTED NOT DETECTED Final   Candida glabrata NOT DETECTED NOT DETECTED Final   Candida krusei NOT DETECTED NOT DETECTED Final   Candida parapsilosis NOT DETECTED NOT DETECTED Final   Candida tropicalis NOT DETECTED NOT DETECTED Final   Cryptococcus neoformans/gattii NOT DETECTED NOT DETECTED Final    Comment: Performed at [facility], [address], [place], [place] [address]  Resp Panel by RT-PCR (Flu A&B, Covid) Nasopharyngeal Swab     Status: None   Collection Time: 04/06/20  8:57 AM   Specimen:  Nasopharyngeal Swab; Nasopharyngeal(NP) swabs in vial transport medium  Result Value Ref Range Status   SARS Coronavirus 2 by RT PCR NEGATIVE NEGATIVE Final    Comment: (NOTE) SARS-CoV-2 target nucleic acids are NOT DETECTED.  The SARS-CoV-2 RNA is generally detectable in upper respiratory specimens during the acute phase of infection. The lowest concentration of SARS-CoV-2 viral copies this assay can detect is 138 copies/mL. A negative result does not preclude SARS-Cov-2 infection and should not be used as the sole basis for treatment or other patient management decisions. A negative  result may occur with  improper specimen collection/handling, submission of specimen other than nasopharyngeal swab, presence of viral mutation(s) within the areas targeted by this assay, and inadequate number of viral copies(<138 copies/mL). A negative result must be combined with clinical observations, patient history, and epidemiological information. The expected result is Negative.  Fact Sheet for Patients:  EntrepreneurPulse.com.au  Fact Sheet for Healthcare Providers:  IncredibleEmployment.be  This test is no t yet approved or cleared by the [place] FDA and  has been authorized for detection and/or diagnosis of SARS-CoV-2 by FDA under an Emergency Use Authorization (EUA). This EUA will remain  in effect (meaning this test can be used) for the duration of the COVID-19 declaration under Section 564(b)(1) of the Act, 21 U.S.C.section 360bbb-3(b)(1), unless the authorization is terminated  or revoked sooner.       Influenza A by PCR NEGATIVE NEGATIVE Final   Influenza B by PCR NEGATIVE NEGATIVE Final    Comment: (NOTE) The Xpert Xpress SARS-CoV-2/FLU/RSV plus assay is intended as an aid in the diagnosis of influenza from Nasopharyngeal swab specimens and should not be used as a sole basis for treatment. Nasal washings and aspirates are unacceptable for  Xpert Xpress SARS-CoV-2/FLU/RSV testing.  Fact Sheet for Patients: EntrepreneurPulse.com.au  Fact Sheet for Healthcare Providers: IncredibleEmployment.be  This test is not yet approved or cleared by the [place] FDA and has been authorized for detection and/or diagnosis of SARS-CoV-2 by FDA under an Emergency Use Authorization (EUA). This EUA will remain in effect (meaning this test can be used) for the duration of the COVID-19 declaration under Section 564(b)(1) of the Act, 21 U.S.C. section 360bbb-3(b)(1), unless the authorization is terminated or revoked.  Performed at [facility], [place] [address] [place], [place] [address]   Culture, blood (routine x 2)     Status: None (Preliminary result)   Collection Time: 04/06/20  9:15 AM   Specimen: BLOOD  Result Value Ref Range Status   Specimen Description   Final    BLOOD LEFT ANTECUBITAL Performed at [place] [address], [place], [place] [address]    Special Requests   Final    BOTTLES DRAWN AEROBIC AND ANAEROBIC Blood Culture adequate volume Performed at [place] [address] [place], [place] [address]    Culture   Final    NO GROWTH 3 DAYS Performed at [facility], [place] [address] [place], [place] [address]    Report Status PENDING  Incomplete  MRSA PCR Screening     Status: Abnormal   Collection Time: 04/07/20  1:23 PM   Specimen: Nasal Mucosa; Nasopharyngeal  Result Value Ref Range Status   MRSA by PCR POSITIVE (A) NEGATIVE Final    Comment:        The GeneXpert MRSA Assay (FDA approved for NASAL specimens only), is one component of a comprehensive MRSA colonization surveillance program. It is not intended to diagnose MRSA infection nor to guide or monitor treatment for MRSA infections. RESULT CALLED TO, READ BACK BY AND VERIFIED WITH: TOMPSON,T. RN _0  04/07/20 BILLINGSLEY,L Performed at  [facility], [place] [address], [place], [place] [address]   Aerobic/Anaerobic Culture (surgical/deep wound)     Status: None (Preliminary result)   Collection Time: 04/07/20  7:42 PM   Specimen: Wound; Abscess  Result Value Ref Range Status   Specimen Description   Final    ABSCESS FOOT RIGHT Performed at [place]  [address] [place], [place] [address]    Special Requests   Final    NONE Performed at [facility], [place] [address], [place], [place] [address]    Gram Stain   Final    MODERATE WBC PRESENT, PREDOMINANTLY PMN FEW GRAM POSITIVE COCCI Performed at [facility], [place] [address], [place], [place] [address]    Culture ABUNDANT STAPHYLOCOCCUS AUREUS  Final   Report Status PENDING  Incomplete    Time coordinating discharge: Over 30 minutes  SIGNED:  Lorella Nimrod, MD  [facility] 04/09/2020, 1:56 PM  If 7PM-7AM, please contact night-coverage [email or web address]  This record has been created using Systems analyst. Errors have been sought and corrected,but may not always be located. Such creation errors do not reflect on the standard of care.

## 2020-04-09 NOTE — TOC Transition Note (Signed)
Transition of Care [facility]) - CM/SW Discharge Note   Patient Details  Name: Shirley [name] MRN: [id number] Date of Birth: May 11, 1975  Transition of [facility] CM/SW Contact:  Lennart Pall, LCSW Phone Number: 04/09/2020, 3:05 PM   Clinical Narrative:     Pt medically cleared for dc.  Orders place for HHRN/PT and rolling walker - referrals completed (see below).  No further TOC needs.  Final next level of care: [place] Barriers to Discharge: Barriers Resolved   Patient Goals and CMS Choice Patient states their goals for this hospitalization and ongoing recovery are:: go home today CMS Medicare.gov Compare Post Acute Care list provided to:: Patient    Discharge Placement                       Discharge Plan and Services                DME Arranged: Walker rolling DME Agency: [employer] Date DME Agency Contacted: 04/09/20 Time DME Agency Contacted: 1430 Representative spoke with at DME Agency: [place]ila [place]: RN,PT [place] Agency: [place] Date [place]: 04/09/20 Time [place]: 6629 Representative spoke with at [place]: Cindie  Social Determinants of Health ([place]) Interventions     Readmission Risk Interventions No flowsheet data found.

## 2020-04-09 NOTE — Progress Notes (Signed)
Inpatient Diabetes Program Recommendations  AACE/ADA: New Consensus Statement on Inpatient Glycemic Control (2015)  Target Ranges:  Prepandial:   less than 140 mg/dL      Peak postprandial:   less than 180 mg/dL (1-2 hours)      Critically ill patients:  140 - 180 mg/dL   Lab Results  Component Value Date   GLUCAP 186 (H) 04/09/2020   HGBA1C 12.3 (H) 04/07/2020    Review of Glycemic Control Results for Shirley [name], Shirley [name] (MRN [id number]) as of 04/09/2020 10:39  Ref. Range 04/08/2020 11:51 04/08/2020 16:46 04/08/2020 21:46 04/09/2020 07:35  Glucose-Capillary Latest Ref Range: 70 - 99 mg/dL 185 (H) 190 (H) 228 (H) 186 (H)   Diabetes history: Type 2 DM Outpatient Diabetes medications: Lantus 35 units QHS, Humalog 7 units TID Current orders for Inpatient glycemic control: Lantus 35 units QHS, Novolog 0-15 units TID, Novolog 0-5 units QHS  Inpatient Diabetes Program Recommendations:    Consider increasing Lantus to 40 units QHS.  Thanks, Bronson Curb, MSN, RNC-OB Diabetes Coordinator [phone] (8a-5p)

## 2020-04-09 NOTE — Plan of Care (Signed)

## 2020-04-09 NOTE — Care Management Important Message (Signed)
Important Message  Patient Details IM Letter given to the Patient. Name: Shirley [name] MRN: [id number] Date of Birth: 1975/08/09   Medicare Important Message Given:  Yes     Kerin Salen 04/09/2020, 11:08 AM

## 2020-04-11 LAB — CULTURE, BLOOD (ROUTINE X 2)
Culture: NO GROWTH
Special Requests: ADEQUATE

## 2020-04-12 LAB — FUNGUS STAIN

## 2020-04-12 LAB — FUNGAL STAIN REFLEX

## 2020-04-13 LAB — AEROBIC/ANAEROBIC CULTURE W GRAM STAIN (SURGICAL/DEEP WOUND)

## 2020-04-26 IMAGING — CT CT ABD-PELV W/ CM
2 of 5 series · 16 of 46 positions shown, 18 images · IV contrast (omnipaque)
Comparison: 12/24/2016

CLINICAL DATA: Diffuse abdominal pain, nausea, and vomiting.

EXAM:
CT ABDOMEN AND PELVIS WITH CONTRAST
TECHNIQUE: Multidetector CT imaging of the abdomen and pelvis was performed
using the standard protocol following bolus administration of
intravenous contrast.
CONTRAST:  100mL OMNIPAQUE IOHEXOL 300 MG/ML  SOLN

[Series 2: axial st · axial · 0.92mm/px · z∈[-696,-291]mm · 13 of 93 slices shown, 15 images]
[im 6/93  soft-tissue]
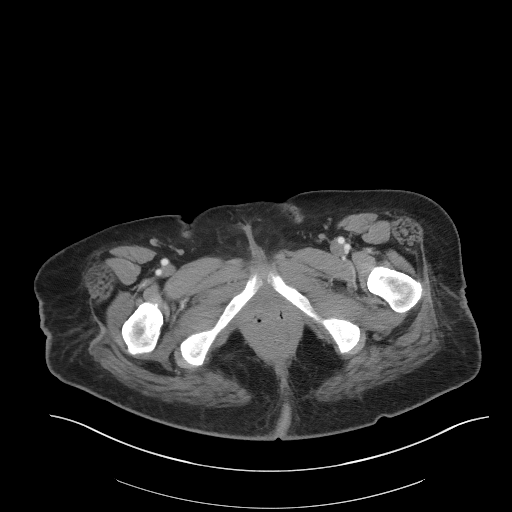
[im 6/93  bone]
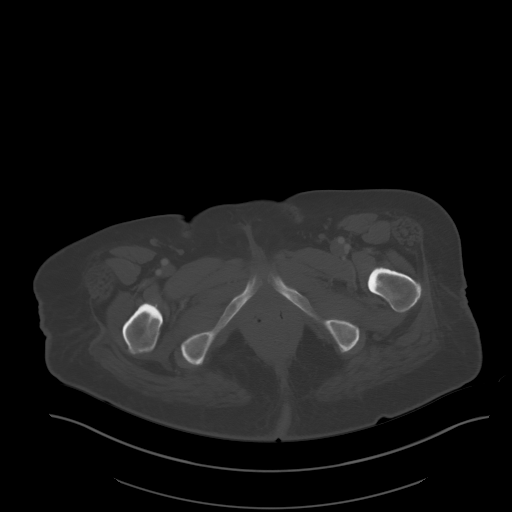
[im 12/93  soft-tissue]
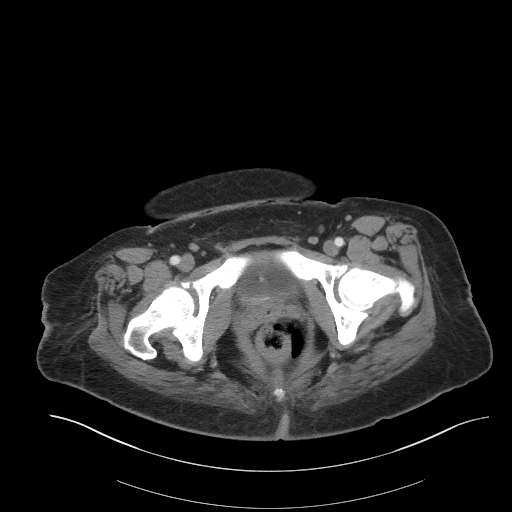
[im 18/93  soft-tissue]
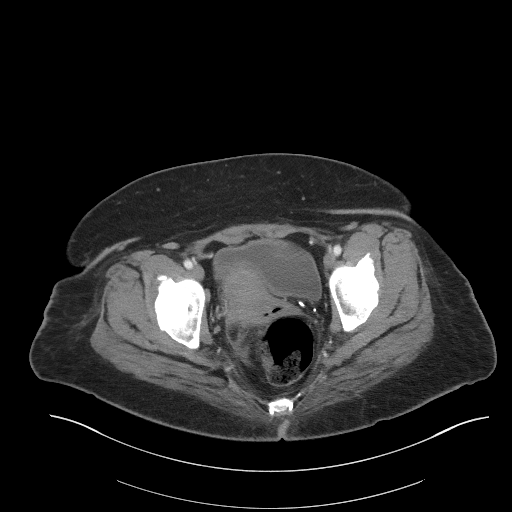
[im 29/93  soft-tissue]
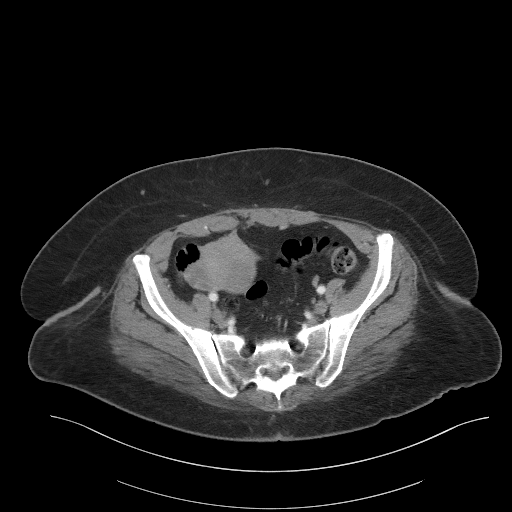
[im 35/93  soft-tissue]
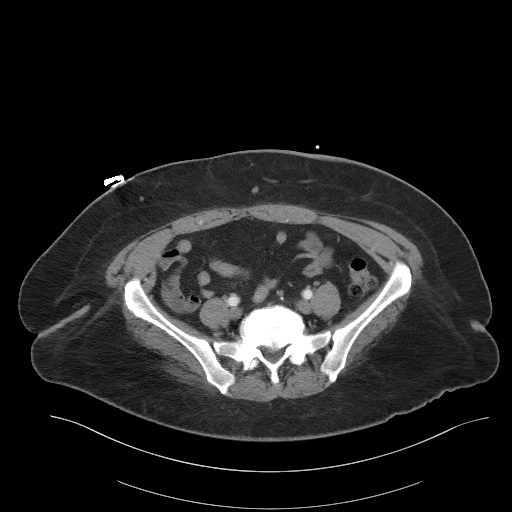
[im 41/93  soft-tissue]
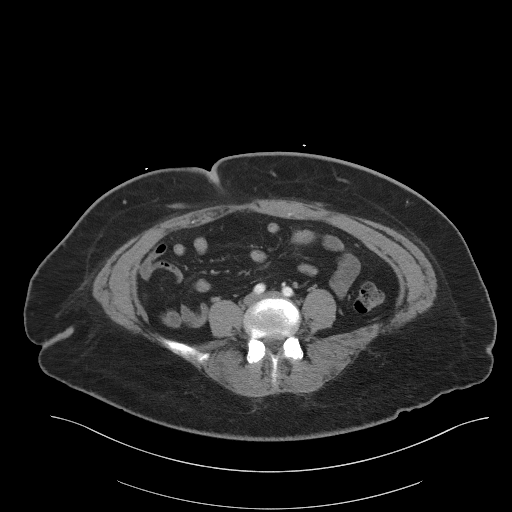
[im 47/93  soft-tissue]
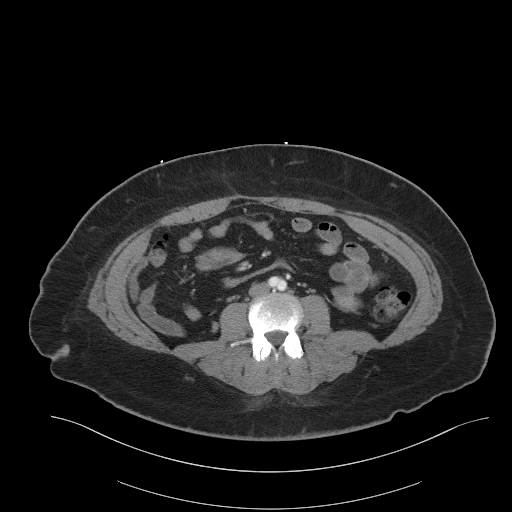
[im 52/93  soft-tissue]
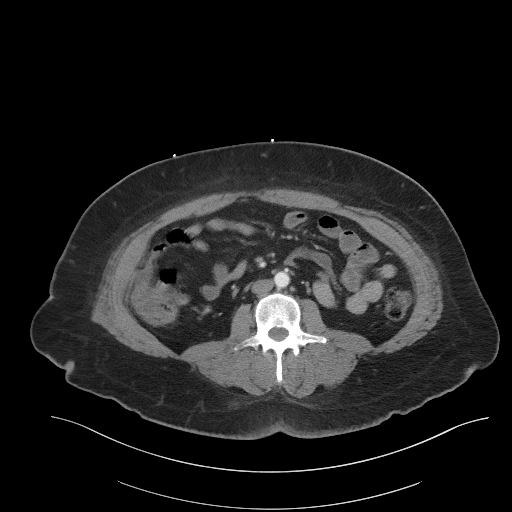
[im 58/93  soft-tissue]
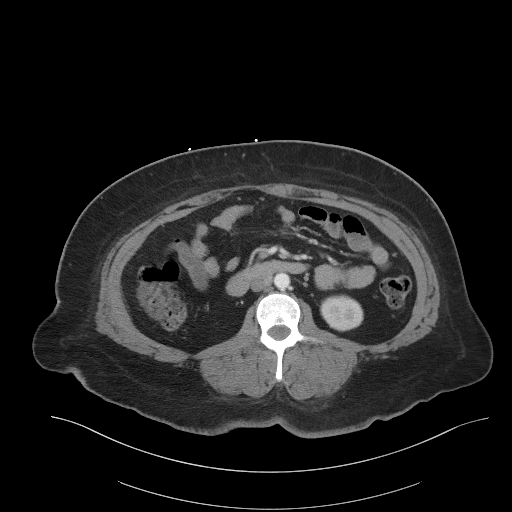
[im 58/93  bone]
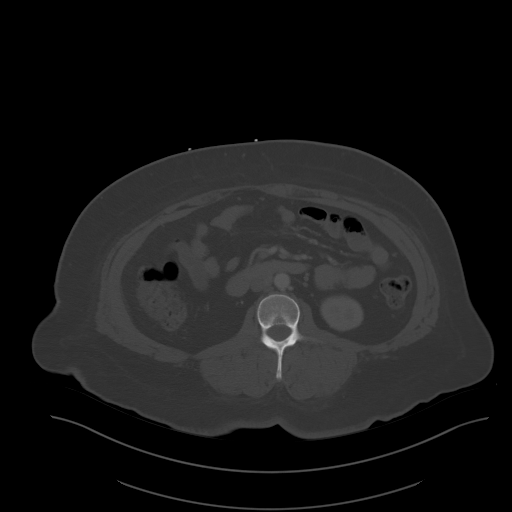
[im 64/93  soft-tissue]
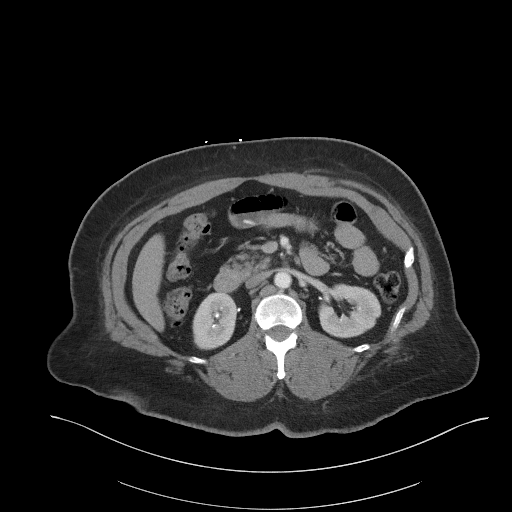
[im 75/93  soft-tissue]
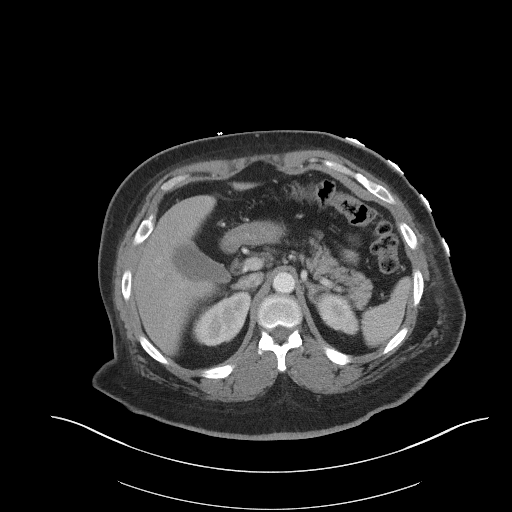
[im 81/93  soft-tissue]
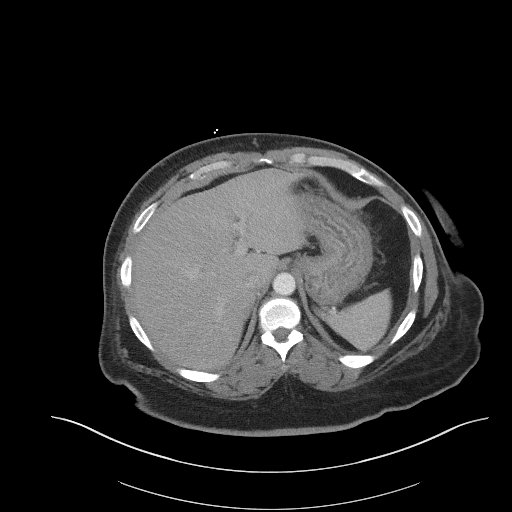
[im 87/93  soft-tissue]
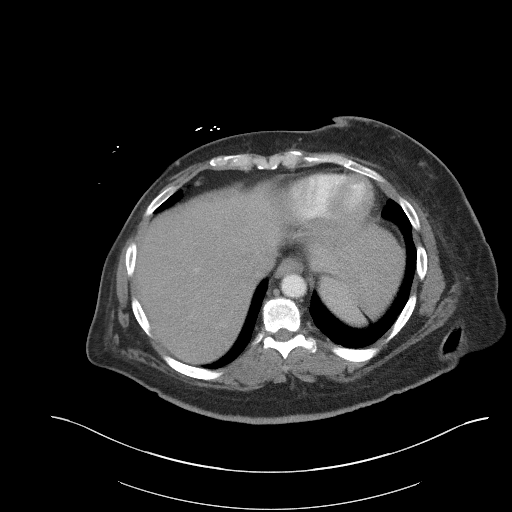

[Series 5: coronal st · coronal · 0.80mm/px · 3 of 94 slices shown]
[im 32/94  soft-tissue]
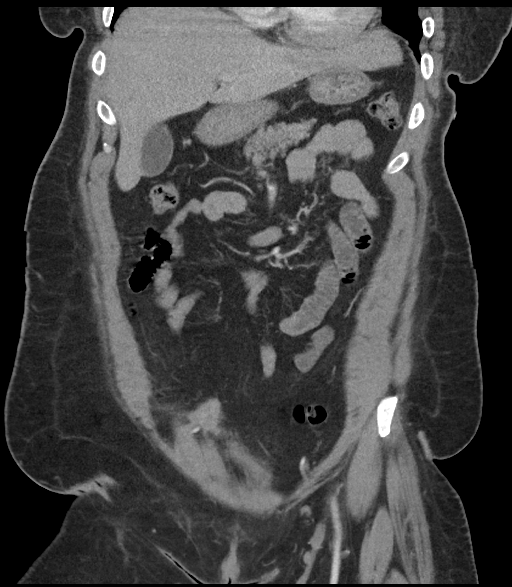
[im 42/94  soft-tissue]
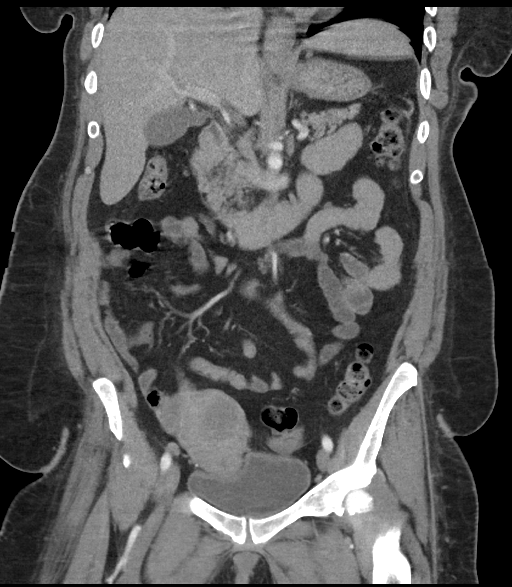
[im 52/94  soft-tissue]
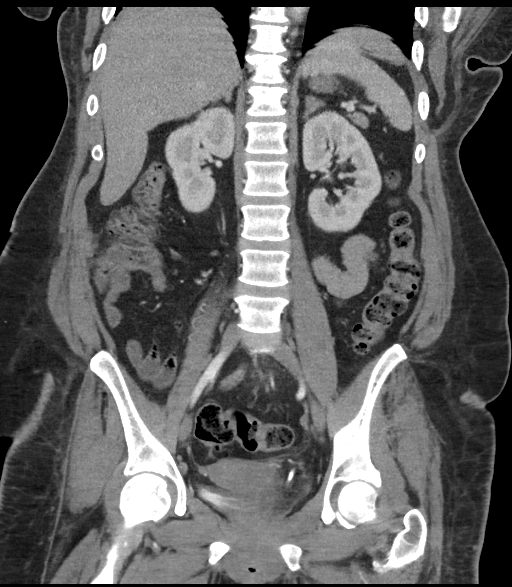

[16 of 46 positions shown; findings below may reference images not displayed]

FINDINGS: Lower chest: The visualized lung bases are clear. A 2 cm pericardial
or other cyst abutting the posterior margin of the left atrium is
unchanged.

Hepatobiliary: The hepatic dome was incompletely imaged on the
portal venous phase images. No focal liver abnormality is
identified. The gallbladder is unremarkable. There is no biliary
dilatation.

Pancreas: Mild fatty infiltration in the pancreatic head. No ductal
dilatation or peripancreatic inflammation.

Spleen: Unremarkable.

Adrenals/Urinary Tract: Unremarkable adrenal glands. Excreted IV
contrast in the renal collecting systems. No evidence of renal mass
or hydronephrosis. Bladder contains a small amount of excreted
contrast and is otherwise unremarkable.

Stomach/Bowel: The stomach is within normal limits. There is no
evidence of bowel obstruction. The appendix is unremarkable.

Vascular/Lymphatic: No significant vascular findings are present. No
enlarged abdominal or pelvic lymph nodes.

Reproductive: Exophytic uterine fibroid has decreased in size, with
the hypoattenuating portion of the mass now measuring 4.1 cm
(previously 6.0 cm). Unremarkable ovaries.

Other: No intraperitoneal free fluid.  No abdominal wall hernia.

Musculoskeletal: Advanced disc degeneration at L5-S1 with bilateral
neural foraminal stenosis.
IMPRESSION: 1. No acute abnormality identified in the abdomen or pelvis.
2. Decreased size of previously treated uterine fibroid.

## 2020-04-30 ENCOUNTER — Ambulatory Visit (INDEPENDENT_AMBULATORY_CARE_PROVIDER_SITE_OTHER): Payer: Medicare Other | Admitting: Podiatry

## 2020-04-30 ENCOUNTER — Other Ambulatory Visit: Payer: Self-pay

## 2020-04-30 DIAGNOSIS — L02611 Cutaneous abscess of right foot: Secondary | ICD-10-CM

## 2020-04-30 DIAGNOSIS — L03115 Cellulitis of right lower limb: Secondary | ICD-10-CM

## 2020-04-30 NOTE — Progress Notes (Signed)
  Subjective:  Patient ID: Shirley [name], female    DOB: 28-Nov-1975,  MRN: [id number]  Chief Complaint  Patient presents with  . Routine Post Op    POV Pt denies fever/nausea/vomiting/chills. Pt expressed she had difficulty obtaining antibiotics prescribed from hospitalist due to her pharmacy being out of stock and thus began taking the antibiotics at a later date. Denies any surgical concerns.    DOS: 04/07/20 Procedure: Incision and drainage of abscess right  45 y.o. female presents with the above complaint. History confirmed with patient.   Objective:  Physical Exam: no tenderness at the surgical site, no edema noted and calf supple, nontender. Incision: fully healed significant hyperkeratosis noted. Assessment:   1. Cellulitis of right foot   2. Abscess of right foot     Plan:  Patient was evaluated and treated and all questions answered.  Post-operative State -Sutured removed. -Wound fully healed. At this point she can just do epsom salt soaks to rid the remainder of the hyperkeratotic tissue. -Will discharge with follow-up as needed.  No follow-ups on file.

## 2020-05-05 ENCOUNTER — Telehealth: Payer: Self-pay | Admitting: *Deleted

## 2020-05-05 NOTE — Telephone Encounter (Signed)
Shirley [name] w/ [facility] said that she has tried multiple times to reach patient and has only spoke w/ her once. She said that the agency may have to discharge her.  Please try to reach out to her and have her call at [phone].  Called patient and left Vm to call agency as soon as possible.

## 2020-05-06 ENCOUNTER — Telehealth: Payer: Self-pay | Admitting: *Deleted

## 2020-05-06 NOTE — Telephone Encounter (Signed)
Called and informed Debbie w/ [facility] per Dr Eleanora Neighbor note.

## 2020-05-06 NOTE — Telephone Encounter (Signed)
Her wound has healed, we can D/C [facility]HC

## 2020-05-14 IMAGING — CR DG CHEST 2V
2 series · 2 of 2 positions shown · non-contrast
Comparison: PA and lateral chest CT 11/28/2016.  Chest 11/07/2016.

CLINICAL DATA: Abdominal pain, nausea and vomiting for 2 days.

EXAM:
CHEST - 2 VIEW

[w chest lat]
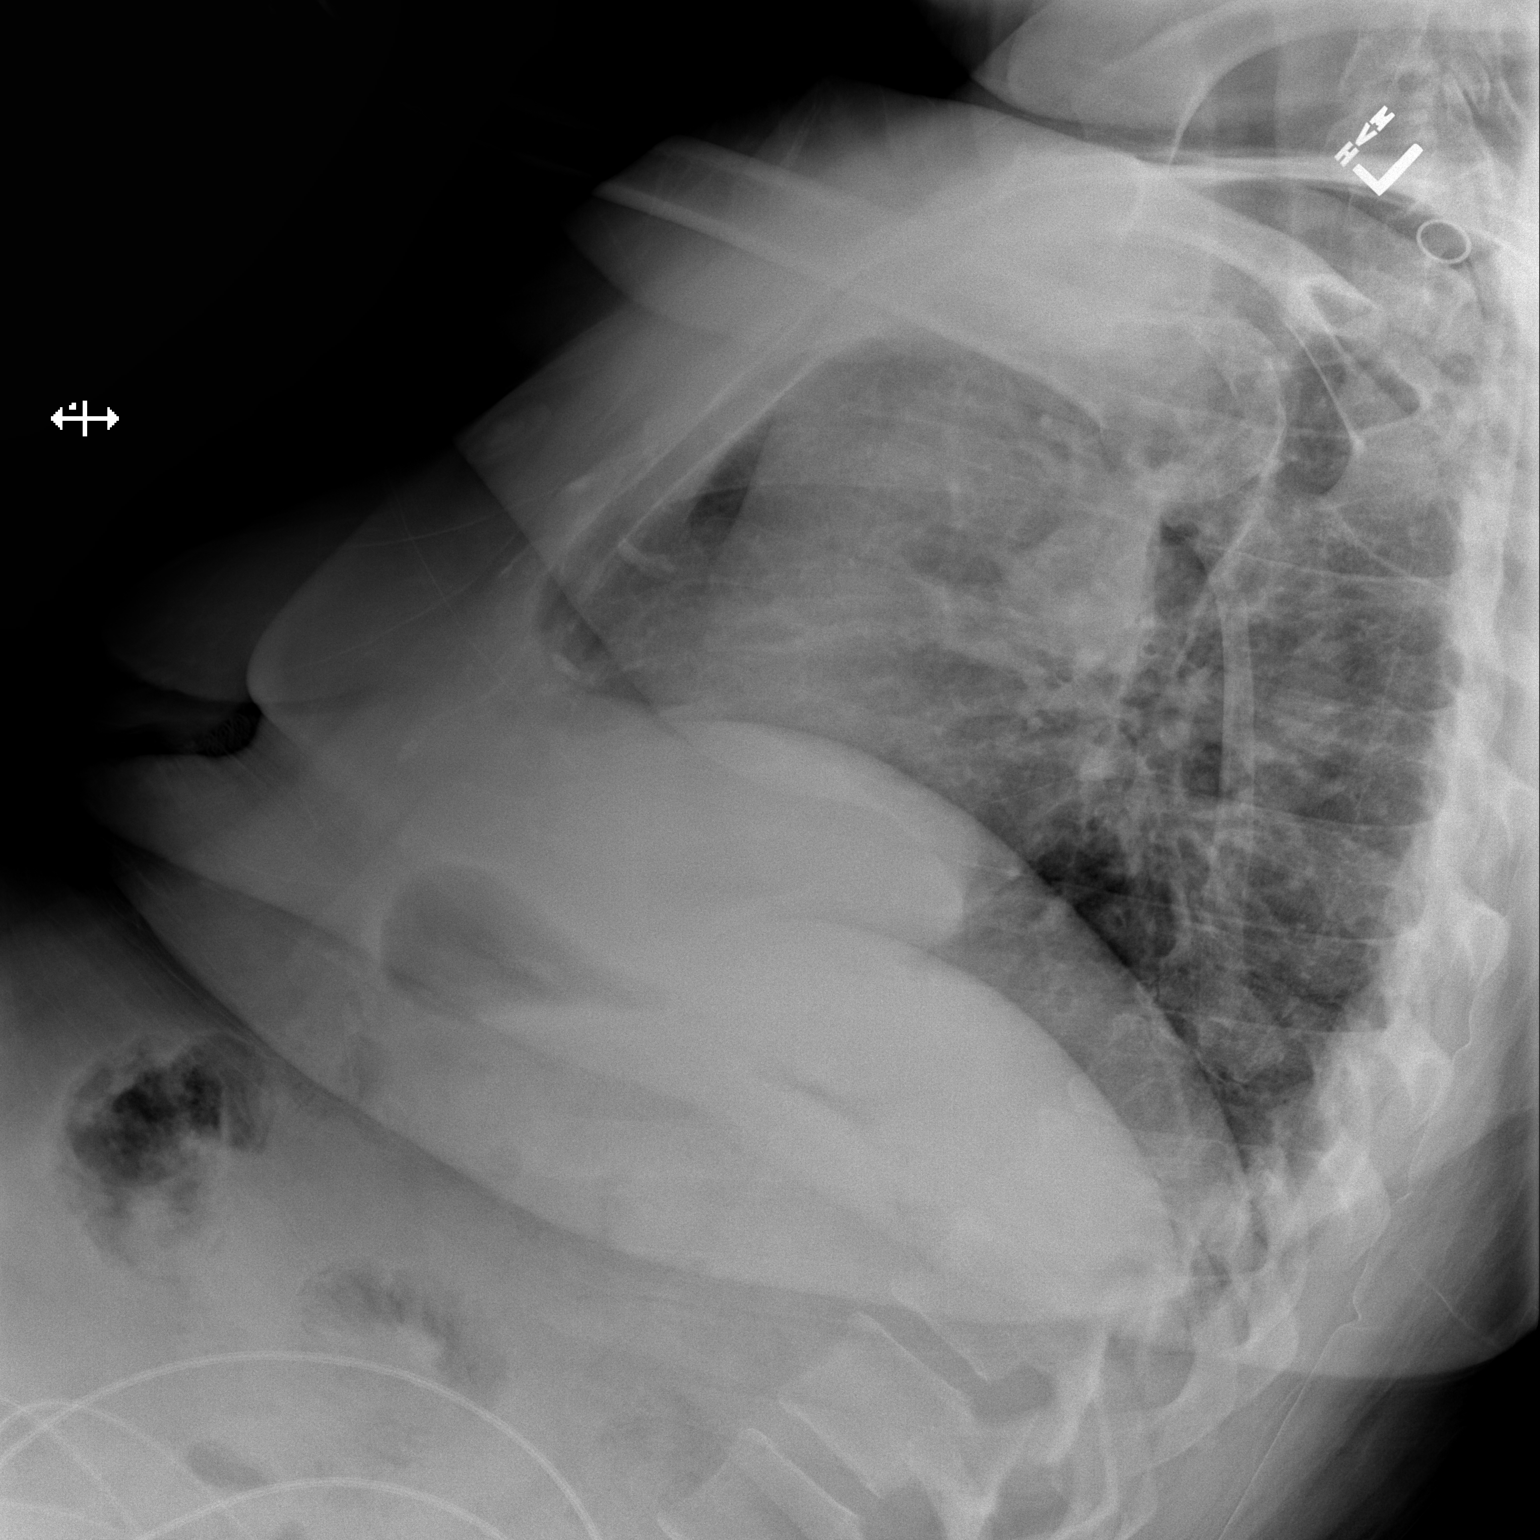

[x chest ap]
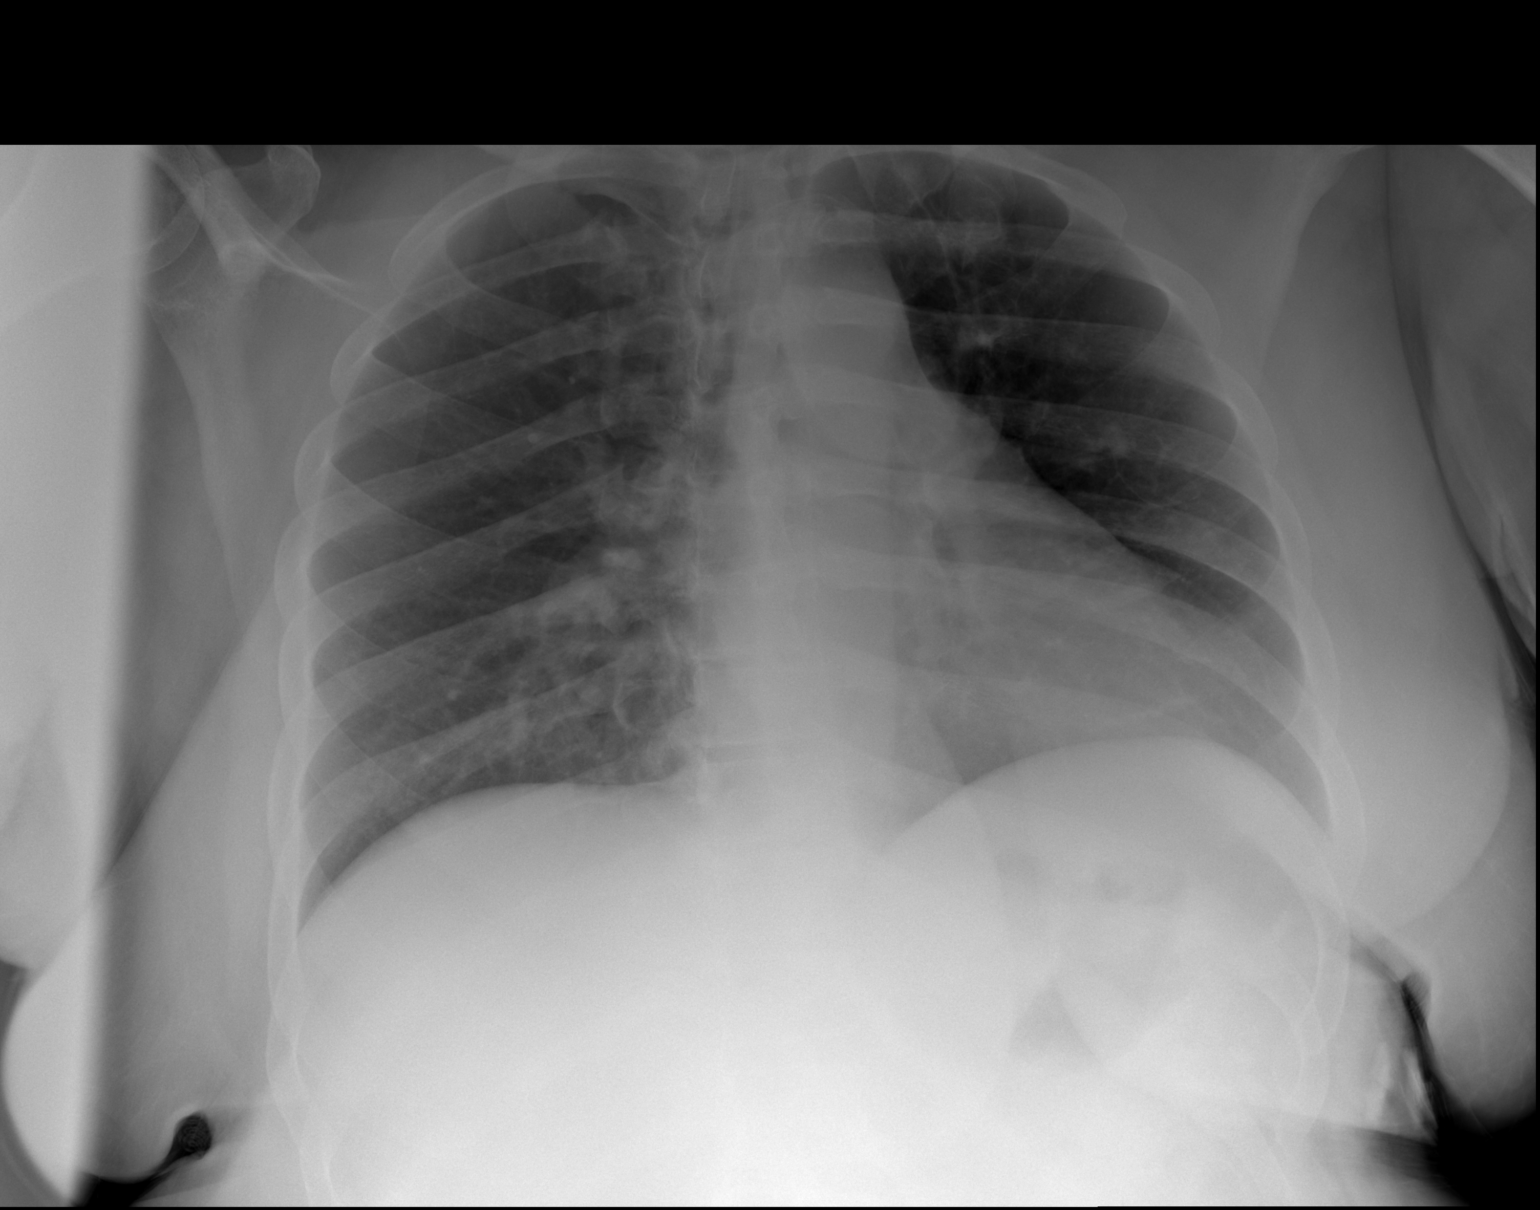

[2 of 2 positions shown; findings below may reference images not displayed]

FINDINGS: Lungs are clear. Heart size is normal. No pneumothorax or pleural
effusion. No focal bony abnormality.
IMPRESSION: Negative chest.

## 2020-05-21 ENCOUNTER — Ambulatory Visit (INDEPENDENT_AMBULATORY_CARE_PROVIDER_SITE_OTHER): Payer: Medicare Other | Admitting: Podiatry

## 2020-05-21 ENCOUNTER — Other Ambulatory Visit: Payer: Self-pay

## 2020-05-21 DIAGNOSIS — R600 Localized edema: Secondary | ICD-10-CM

## 2020-05-21 DIAGNOSIS — M722 Plantar fascial fibromatosis: Secondary | ICD-10-CM

## 2020-05-24 NOTE — Progress Notes (Signed)
  Subjective:  Patient ID: Shirley [name], female    DOB: Jan 18, 1976,  MRN: [id number]  Chief Complaint  Patient presents with  . Routine Post Op    POV #2 DOS 04/07/20. Pt. Complains of edema and soreness of right foot.     DOS: 04/07/20 Procedure: Incision and drainage of abscess right  45 y.o. female presents with the above complaint. History confirmed with patient.   Objective:  Physical Exam: Mild pain to palpation about the medial arch with local edema Incision: fully healed minor hyperkeratosis Assessment:   1. Plantar fasciitis   2. Localized edema     Plan:  Patient was evaluated and treated and all questions answered.  Plantar fasciitis with arch pain -No sign of recurrent of abscess formation noted -Tubigrip and Tri-Lock brace dispensed  No follow-ups on file.

## 2020-06-11 ENCOUNTER — Encounter: Payer: Medicare Other | Admitting: Podiatry

## 2020-07-01 ENCOUNTER — Other Ambulatory Visit: Payer: Self-pay

## 2020-07-05 ENCOUNTER — Ambulatory Visit: Payer: Medicare Other | Admitting: Endocrinology

## 2020-07-06 ENCOUNTER — Other Ambulatory Visit: Payer: Self-pay

## 2020-07-06 ENCOUNTER — Ambulatory Visit (INDEPENDENT_AMBULATORY_CARE_PROVIDER_SITE_OTHER): Payer: Medicare Other | Admitting: Podiatry

## 2020-07-06 DIAGNOSIS — L97512 Non-pressure chronic ulcer of other part of right foot with fat layer exposed: Secondary | ICD-10-CM

## 2020-07-06 DIAGNOSIS — I739 Peripheral vascular disease, unspecified: Secondary | ICD-10-CM | POA: Diagnosis not present

## 2020-07-06 MED ORDER — SILVER SULFADIAZINE 1 % EX CREA: TOPICAL_CREAM | CUTANEOUS | 0 refills | Status: DC

## 2020-07-06 NOTE — Patient Instructions (Signed)
Your referral has been sent to:   [facility] [address] [place],  [place]  [address] [phone]

## 2020-07-06 NOTE — Progress Notes (Signed)
  Subjective:  Patient ID: Shirley [name], female    DOB: Jul 20, 1975,  MRN: [id number]  Chief Complaint  Patient presents with  . Cellulitis    Cellulitis of the right foot. Pt states that right great toe has been purple for 3 weeks.    45 y.o. female presents for wound care. Hx confirmed with patient. States it started on the big toe, then on the second toe, and back to the first toe. Denies known injury. Denies pedicure. Denies new shoegear. Objective:  Physical Exam: Wound Location: right hallux Wound Measurement: 0.5x0.3 Wound Base: Granular/Healthy Peri-wound: Calloused Exudate: Scant/small amount Serosanguinous exudate wound without warmth, erythema, signs of acute infection Prior to debridement hard dried blood blister. Small residual ulcers.  Palpable pulses right foot Assessment:   1. Ulcer of great toe, right, with fat layer exposed ([place])   2. PAD (peripheral artery disease) ([employer])    Plan:  Patient was evaluated and treated and all questions answered.  Ulcer right foot -Debrided ulcer. It does appear this started as a blister or from pressure. Underneath the dried blister the wound appeared healthy. I'm not sure if this is indeed a vascular issue as this would not have healed so quickly in that case. Still will order ABIs/PVRs to evaluate her circulation. -Dressing applied consisting of silvadene and band-aid  -Offload ulcer in surgical shoe -Patient to call ASAP if wound worsens.  Return in about 2 weeks (around 07/20/2020) for wound check.

## 2020-07-07 ENCOUNTER — Inpatient Hospital Stay (HOSPITAL_COMMUNITY): Admission: RE | Admit: 2020-07-07 | Payer: Medicare Other | Source: Ambulatory Visit

## 2020-07-08 ENCOUNTER — Other Ambulatory Visit: Payer: Self-pay | Admitting: Podiatry

## 2020-07-08 DIAGNOSIS — I739 Peripheral vascular disease, unspecified: Secondary | ICD-10-CM

## 2020-07-08 DIAGNOSIS — L97512 Non-pressure chronic ulcer of other part of right foot with fat layer exposed: Secondary | ICD-10-CM

## 2020-07-10 IMAGING — DX DG ABDOMEN ACUTE W/ 1V CHEST
3 series · 3 of 3 positions shown · non-contrast
Comparison: 10/09/2017

CLINICAL DATA: Chest pain and abdominal pain.

EXAM:
DG ABDOMEN ACUTE W/ 1V CHEST

[x chest ap]
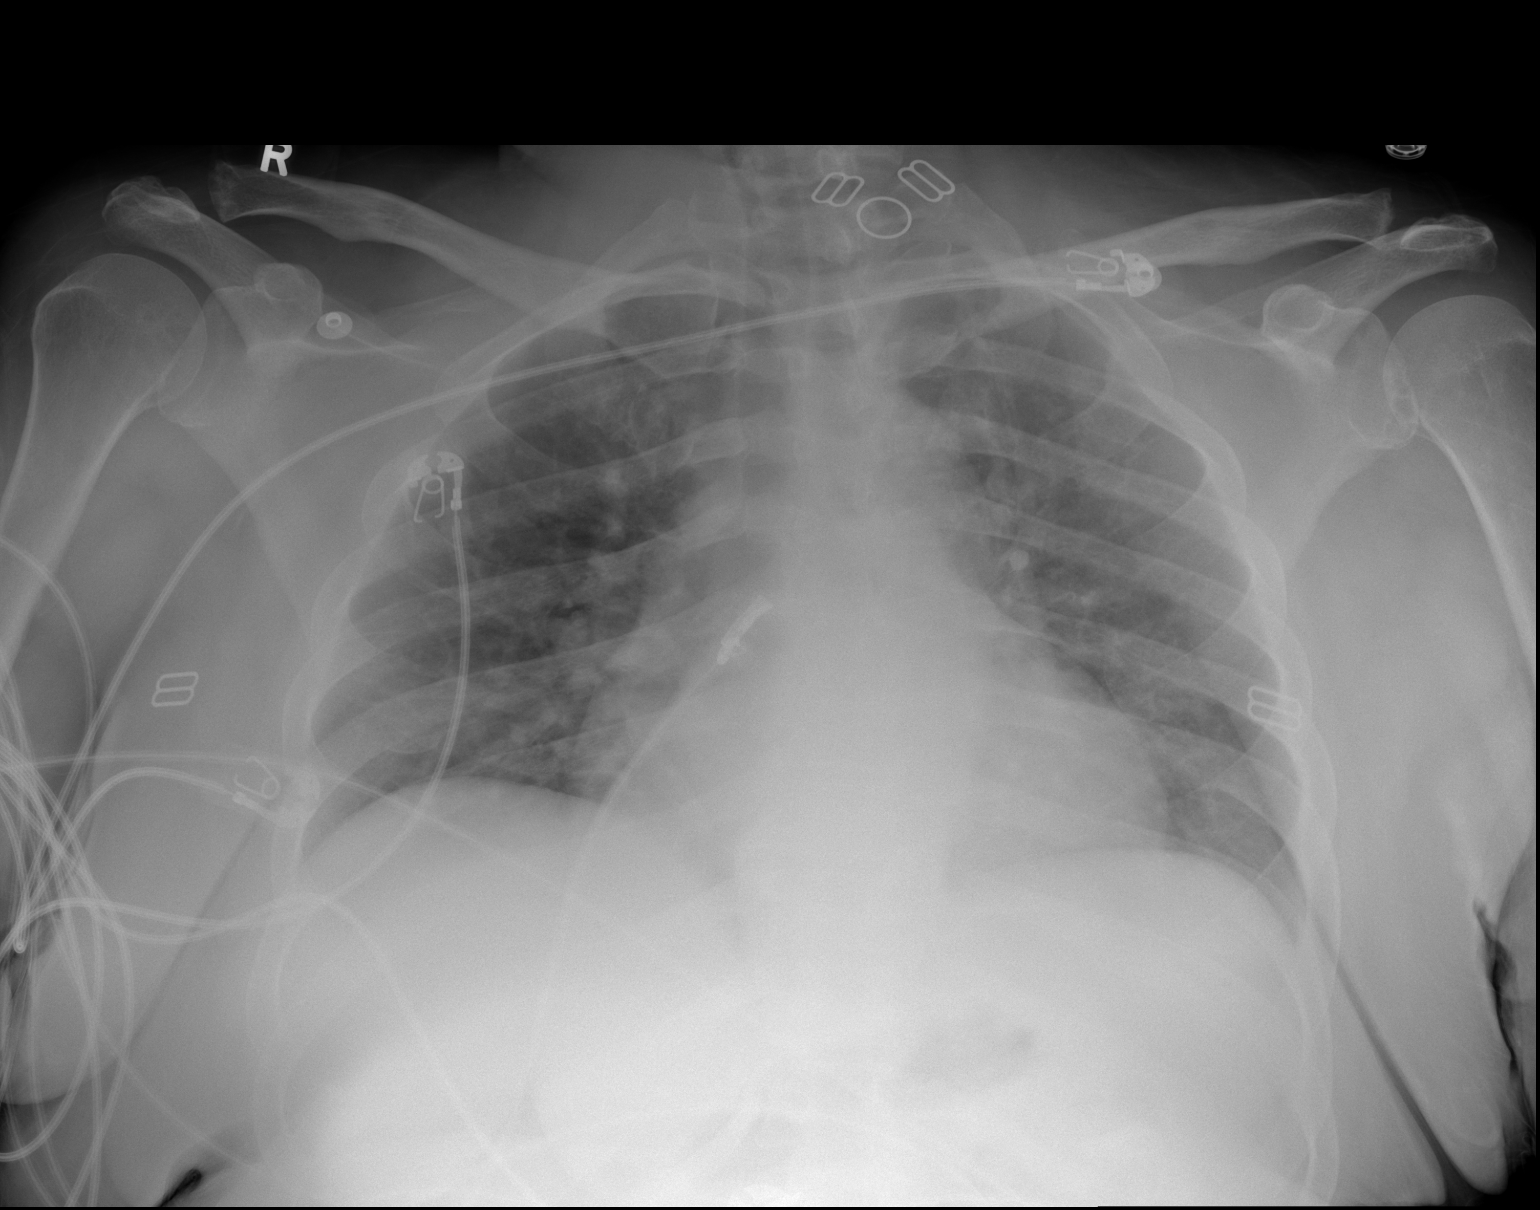

[x abdomen erect]
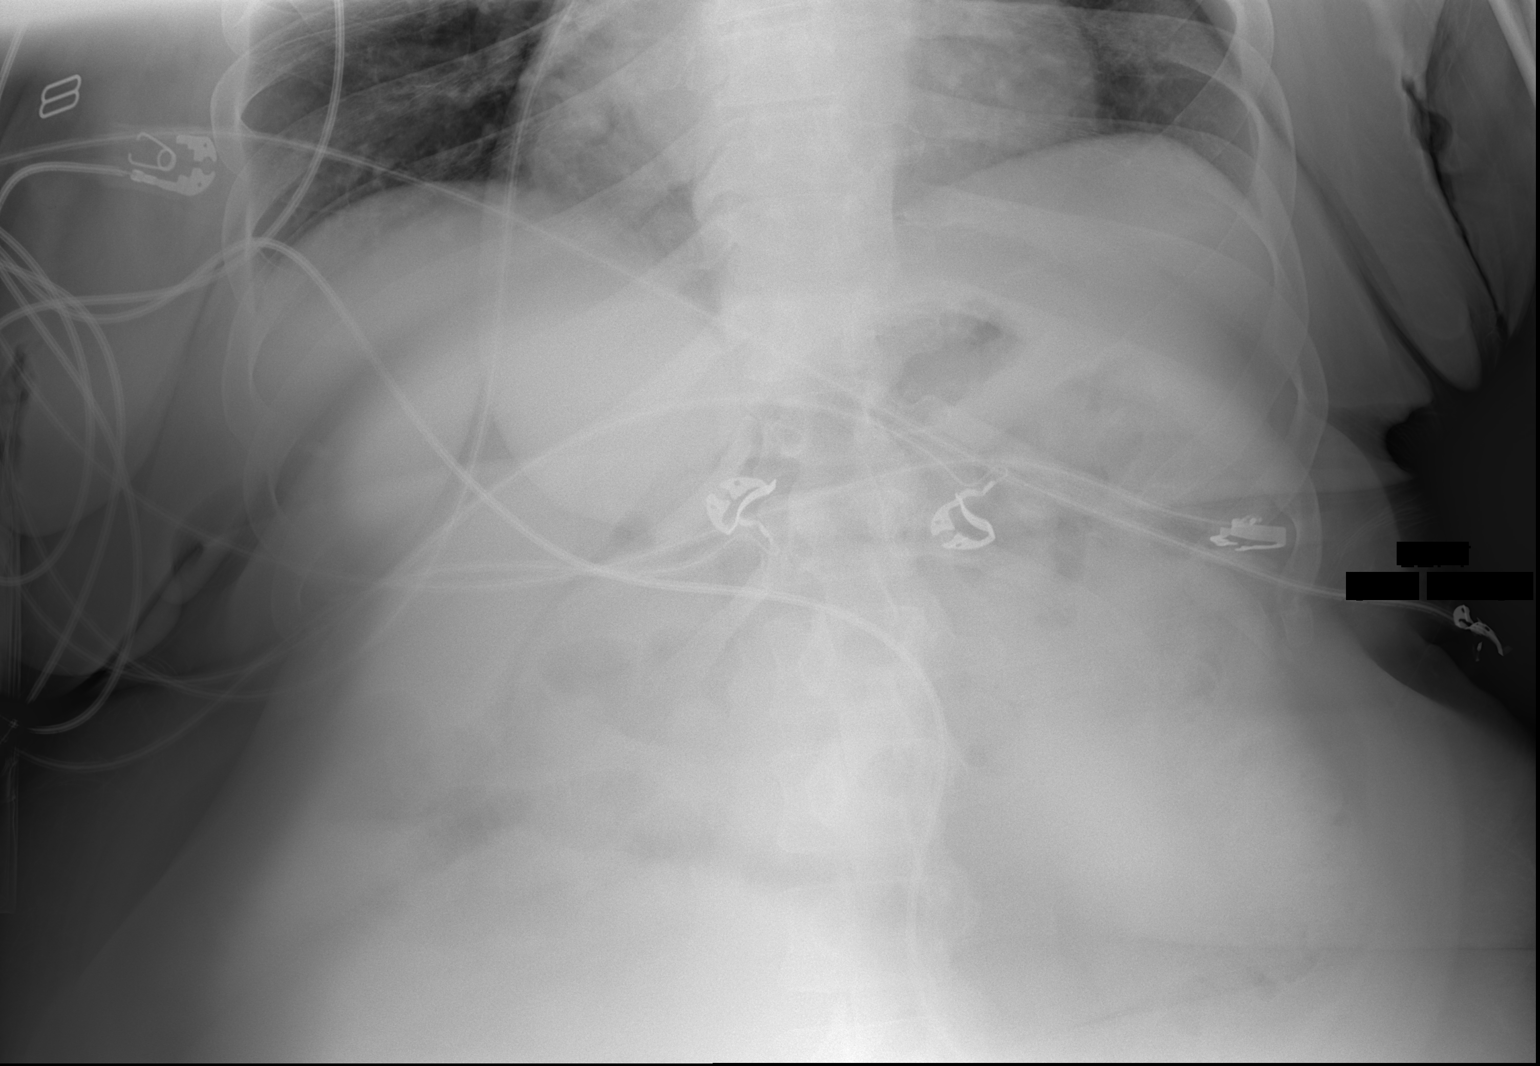

[x abdomen supine]
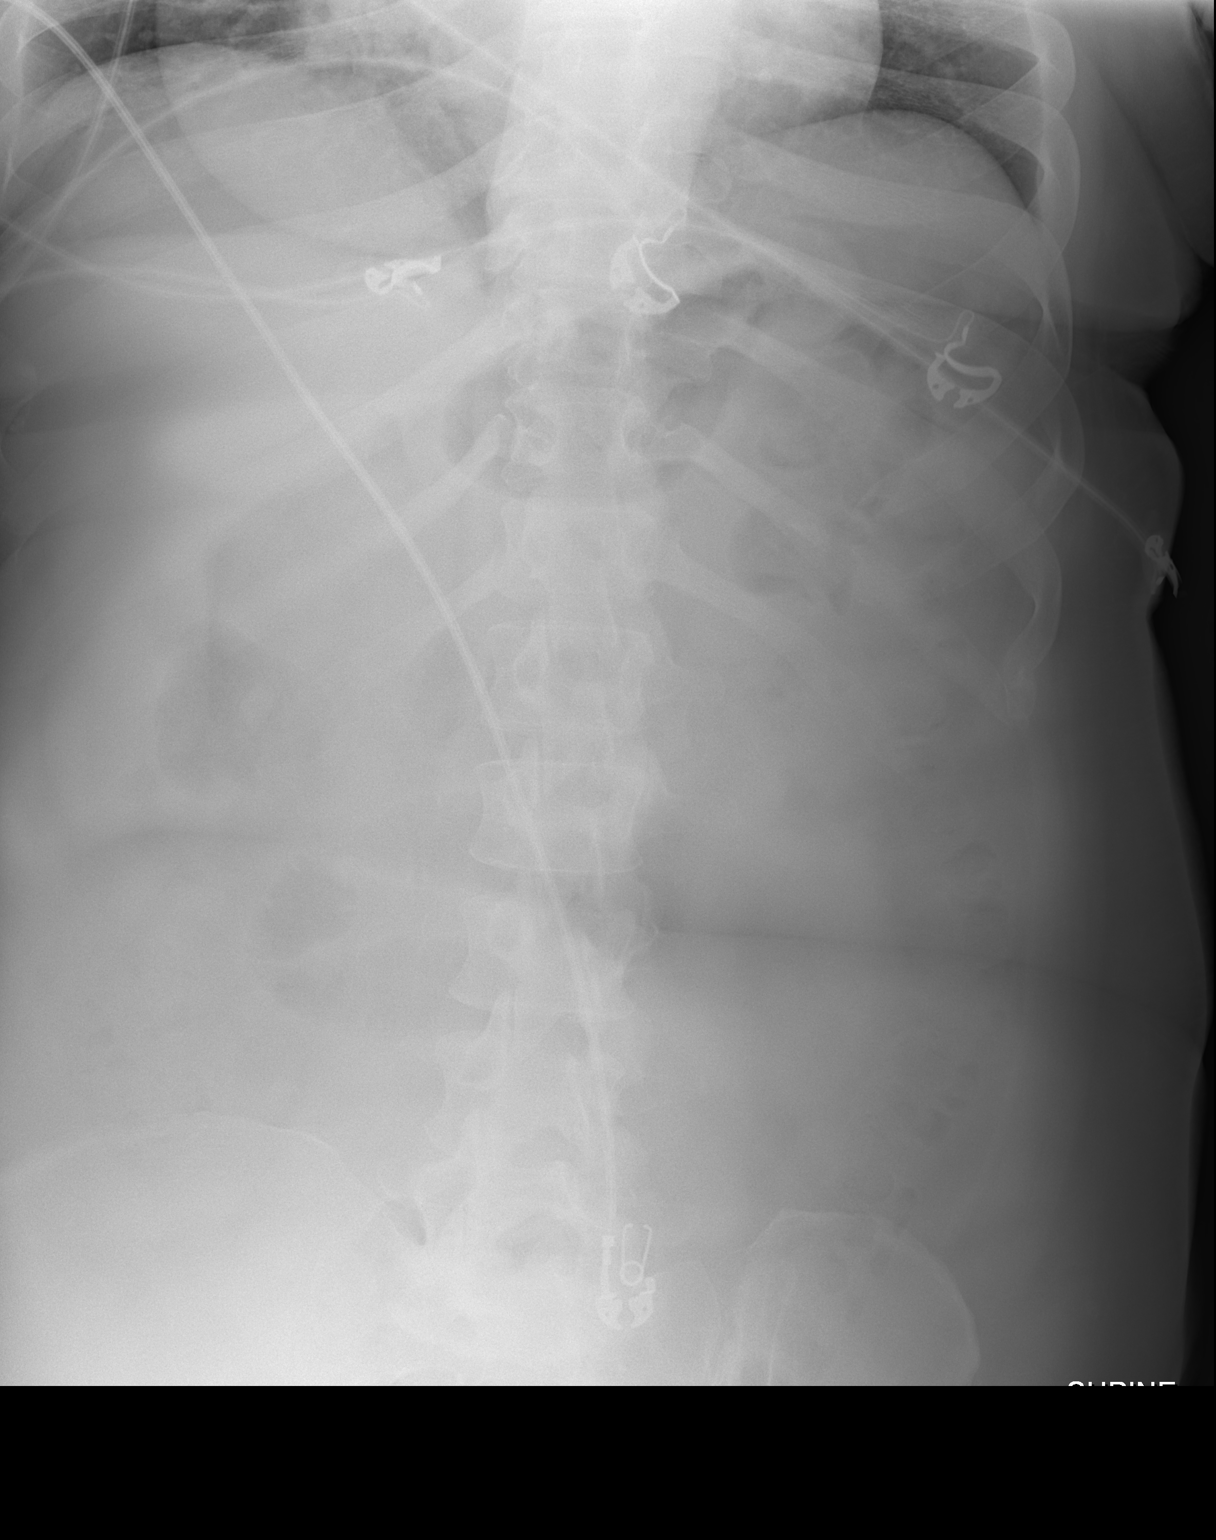

[3 of 3 positions shown; findings below may reference images not displayed]

FINDINGS: Chest view demonstrates low inspiratory effort. Heart size is mildly
prominent but likely accentuated by the low lung volumes. No focal
lung disease. Negative for free air. Mildly dilated loops of small
bowel in the mid abdomen measuring roughly 2.9 cm. There is gas in
the left colon and evidence for gas in the stomach. The pelvis is
not completely imaged.
IMPRESSION: Mildly dilated loops of small bowel in the mid abdomen are
nonspecific. No evidence for a high-grade bowel obstruction.

Low lung volumes without focal chest disease.

## 2020-07-14 ENCOUNTER — Ambulatory Visit (HOSPITAL_COMMUNITY): Admission: RE | Admit: 2020-07-14 | Payer: Medicare Other | Source: Ambulatory Visit

## 2020-07-20 ENCOUNTER — Ambulatory Visit (INDEPENDENT_AMBULATORY_CARE_PROVIDER_SITE_OTHER): Payer: Medicare Other | Admitting: Podiatry

## 2020-07-20 DIAGNOSIS — Z5329 Procedure and treatment not carried out because of patient's decision for other reasons: Secondary | ICD-10-CM

## 2020-07-20 NOTE — Progress Notes (Signed)
No show for appt. 

## 2020-08-20 ENCOUNTER — Ambulatory Visit: Payer: Medicare Other | Admitting: Endocrinology

## 2021-04-16 ENCOUNTER — Encounter (HOSPITAL_COMMUNITY): Payer: Self-pay | Admitting: Emergency Medicine

## 2021-04-16 ENCOUNTER — Emergency Department (HOSPITAL_COMMUNITY): Payer: Medicare Other

## 2021-04-16 ENCOUNTER — Inpatient Hospital Stay (HOSPITAL_COMMUNITY)
Admission: EM | Admit: 2021-04-16 | Discharge: 2021-04-26 | DRG: 872 | Disposition: A | Discharge status: Home Health | Payer: Medicare Other | Attending: Internal Medicine | Admitting: Internal Medicine

## 2021-04-16 ENCOUNTER — Other Ambulatory Visit: Payer: Self-pay

## 2021-04-16 DIAGNOSIS — E669 Obesity, unspecified: Secondary | ICD-10-CM | POA: Diagnosis present

## 2021-04-16 DIAGNOSIS — Y92239 Unspecified place in hospital as the place of occurrence of the external cause: Secondary | ICD-10-CM | POA: Diagnosis present

## 2021-04-16 DIAGNOSIS — E1122 Type 2 diabetes mellitus with diabetic chronic kidney disease: Secondary | ICD-10-CM | POA: Diagnosis present

## 2021-04-16 DIAGNOSIS — L0291 Cutaneous abscess, unspecified: Secondary | ICD-10-CM | POA: Diagnosis present

## 2021-04-16 DIAGNOSIS — N1831 Chronic kidney disease, stage 3a: Secondary | ICD-10-CM | POA: Diagnosis present

## 2021-04-16 DIAGNOSIS — H109 Unspecified conjunctivitis: Secondary | ICD-10-CM

## 2021-04-16 DIAGNOSIS — Z7985 Long-term (current) use of injectable non-insulin antidiabetic drugs: Secondary | ICD-10-CM

## 2021-04-16 DIAGNOSIS — N179 Acute kidney failure, unspecified: Secondary | ICD-10-CM | POA: Diagnosis present

## 2021-04-16 DIAGNOSIS — R609 Edema, unspecified: Secondary | ICD-10-CM

## 2021-04-16 DIAGNOSIS — Z6832 Body mass index (BMI) 32.0-32.9, adult: Secondary | ICD-10-CM

## 2021-04-16 DIAGNOSIS — M25551 Pain in right hip: Secondary | ICD-10-CM | POA: Diagnosis present

## 2021-04-16 DIAGNOSIS — I129 Hypertensive chronic kidney disease with stage 1 through stage 4 chronic kidney disease, or unspecified chronic kidney disease: Secondary | ICD-10-CM | POA: Diagnosis present

## 2021-04-16 DIAGNOSIS — N838 Other noninflammatory disorders of ovary, fallopian tube and broad ligament: Secondary | ICD-10-CM | POA: Diagnosis present

## 2021-04-16 DIAGNOSIS — Z79899 Other long term (current) drug therapy: Secondary | ICD-10-CM

## 2021-04-16 DIAGNOSIS — A419 Sepsis, unspecified organism: Principal | ICD-10-CM

## 2021-04-16 DIAGNOSIS — I2510 Atherosclerotic heart disease of native coronary artery without angina pectoris: Secondary | ICD-10-CM | POA: Diagnosis present

## 2021-04-16 DIAGNOSIS — Z86711 Personal history of pulmonary embolism: Secondary | ICD-10-CM

## 2021-04-16 DIAGNOSIS — E8770 Fluid overload, unspecified: Secondary | ICD-10-CM | POA: Diagnosis present

## 2021-04-16 DIAGNOSIS — Z955 Presence of coronary angioplasty implant and graft: Secondary | ICD-10-CM

## 2021-04-16 DIAGNOSIS — Z885 Allergy status to narcotic agent status: Secondary | ICD-10-CM | POA: Diagnosis not present

## 2021-04-16 DIAGNOSIS — R1900 Intra-abdominal and pelvic swelling, mass and lump, unspecified site: Secondary | ICD-10-CM | POA: Diagnosis present

## 2021-04-16 DIAGNOSIS — L02213 Cutaneous abscess of chest wall: Secondary | ICD-10-CM | POA: Diagnosis present

## 2021-04-16 DIAGNOSIS — Z8673 Personal history of transient ischemic attack (TIA), and cerebral infarction without residual deficits: Secondary | ICD-10-CM

## 2021-04-16 DIAGNOSIS — E119 Type 2 diabetes mellitus without complications: Secondary | ICD-10-CM | POA: Diagnosis present

## 2021-04-16 DIAGNOSIS — N61 Mastitis without abscess: Secondary | ICD-10-CM | POA: Diagnosis present

## 2021-04-16 DIAGNOSIS — Z20822 Contact with and (suspected) exposure to covid-19: Secondary | ICD-10-CM | POA: Diagnosis present

## 2021-04-16 DIAGNOSIS — F419 Anxiety disorder, unspecified: Secondary | ICD-10-CM | POA: Diagnosis present

## 2021-04-16 DIAGNOSIS — H1033 Unspecified acute conjunctivitis, bilateral: Secondary | ICD-10-CM

## 2021-04-16 DIAGNOSIS — B181 Chronic viral hepatitis B without delta-agent: Secondary | ICD-10-CM | POA: Diagnosis present

## 2021-04-16 DIAGNOSIS — E871 Hypo-osmolality and hyponatremia: Secondary | ICD-10-CM | POA: Diagnosis present

## 2021-04-16 DIAGNOSIS — F32A Depression, unspecified: Secondary | ICD-10-CM | POA: Diagnosis present

## 2021-04-16 DIAGNOSIS — Z833 Family history of diabetes mellitus: Secondary | ICD-10-CM

## 2021-04-16 DIAGNOSIS — I10 Essential (primary) hypertension: Secondary | ICD-10-CM | POA: Diagnosis not present

## 2021-04-16 DIAGNOSIS — Z8614 Personal history of Methicillin resistant Staphylococcus aureus infection: Secondary | ICD-10-CM

## 2021-04-16 DIAGNOSIS — M797 Fibromyalgia: Secondary | ICD-10-CM | POA: Diagnosis present

## 2021-04-16 DIAGNOSIS — E1143 Type 2 diabetes mellitus with diabetic autonomic (poly)neuropathy: Secondary | ICD-10-CM | POA: Diagnosis present

## 2021-04-16 DIAGNOSIS — Z87891 Personal history of nicotine dependence: Secondary | ICD-10-CM

## 2021-04-16 DIAGNOSIS — E11649 Type 2 diabetes mellitus with hypoglycemia without coma: Secondary | ICD-10-CM | POA: Diagnosis present

## 2021-04-16 DIAGNOSIS — Z888 Allergy status to other drugs, medicaments and biological substances status: Secondary | ICD-10-CM

## 2021-04-16 DIAGNOSIS — L02211 Cutaneous abscess of abdominal wall: Secondary | ICD-10-CM | POA: Diagnosis not present

## 2021-04-16 DIAGNOSIS — R0600 Dyspnea, unspecified: Secondary | ICD-10-CM

## 2021-04-16 DIAGNOSIS — Z794 Long term (current) use of insulin: Secondary | ICD-10-CM | POA: Diagnosis not present

## 2021-04-16 DIAGNOSIS — E1142 Type 2 diabetes mellitus with diabetic polyneuropathy: Secondary | ICD-10-CM | POA: Diagnosis present

## 2021-04-16 DIAGNOSIS — B958 Unspecified staphylococcus as the cause of diseases classified elsewhere: Secondary | ICD-10-CM | POA: Diagnosis present

## 2021-04-16 DIAGNOSIS — E785 Hyperlipidemia, unspecified: Secondary | ICD-10-CM | POA: Diagnosis present

## 2021-04-16 DIAGNOSIS — G4733 Obstructive sleep apnea (adult) (pediatric): Secondary | ICD-10-CM | POA: Diagnosis present

## 2021-04-16 DIAGNOSIS — Z86718 Personal history of other venous thrombosis and embolism: Secondary | ICD-10-CM

## 2021-04-16 DIAGNOSIS — E1165 Type 2 diabetes mellitus with hyperglycemia: Secondary | ICD-10-CM | POA: Diagnosis present

## 2021-04-16 DIAGNOSIS — K219 Gastro-esophageal reflux disease without esophagitis: Secondary | ICD-10-CM | POA: Diagnosis present

## 2021-04-16 DIAGNOSIS — T368X5A Adverse effect of other systemic antibiotics, initial encounter: Secondary | ICD-10-CM | POA: Diagnosis present

## 2021-04-16 DIAGNOSIS — I252 Old myocardial infarction: Secondary | ICD-10-CM

## 2021-04-16 LAB — CBC WITH DIFFERENTIAL/PLATELET
Abs Immature Granulocytes: 0.16 10*3/uL — ABNORMAL HIGH (ref 0.00–0.07)
Basophils Absolute: 0.1 10*3/uL (ref 0.0–0.1)
Basophils Relative: 0 %
Eosinophils Absolute: 0.2 10*3/uL (ref 0.0–0.5)
Eosinophils Relative: 1 %
HCT: 32.4 % — ABNORMAL LOW (ref 36.0–46.0)
Hemoglobin: 11.4 g/dL — ABNORMAL LOW (ref 12.0–15.0)
Immature Granulocytes: 1 %
Lymphocytes Relative: 12 %
Lymphs Abs: 3 10*3/uL (ref 0.7–4.0)
MCH: 27.7 pg (ref 26.0–34.0)
MCHC: 35.2 g/dL (ref 30.0–36.0)
MCV: 78.6 fL — ABNORMAL LOW (ref 80.0–100.0)
Monocytes Absolute: 2.2 10*3/uL — ABNORMAL HIGH (ref 0.1–1.0)
Monocytes Relative: 9 %
Neutro Abs: 18.6 10*3/uL — ABNORMAL HIGH (ref 1.7–7.7)
Neutrophils Relative %: 77 %
Platelets: 404 10*3/uL — ABNORMAL HIGH (ref 150–400)
RBC: 4.12 MIL/uL (ref 3.87–5.11)
RDW: 14.2 % (ref 11.5–15.5)
WBC: 24.2 10*3/uL — ABNORMAL HIGH (ref 4.0–10.5)
nRBC: 0 % (ref 0.0–0.2)

## 2021-04-16 LAB — RESP PANEL BY RT-PCR (FLU A&B, COVID) ARPGX2
Influenza A by PCR: NEGATIVE
Influenza B by PCR: NEGATIVE
SARS Coronavirus 2 by RT PCR: NEGATIVE

## 2021-04-16 LAB — PROTIME-INR
INR: 1.1 (ref 0.8–1.2)
Prothrombin Time: 14.5 seconds (ref 11.4–15.2)

## 2021-04-16 LAB — COMPREHENSIVE METABOLIC PANEL
ALT: 10 U/L (ref 0–44)
AST: 28 U/L (ref 15–41)
Albumin: 1.8 g/dL — ABNORMAL LOW (ref 3.5–5.0)
Alkaline Phosphatase: 124 U/L (ref 38–126)
Anion gap: 9 (ref 5–15)
BUN: 16 mg/dL (ref 6–20)
CO2: 20 mmol/L — ABNORMAL LOW (ref 22–32)
Calcium: 8.4 mg/dL — ABNORMAL LOW (ref 8.9–10.3)
Chloride: 104 mmol/L (ref 98–111)
Creatinine, Ser: 1.62 mg/dL — ABNORMAL HIGH (ref 0.44–1.00)
GFR, Estimated: 39 mL/min — ABNORMAL LOW (ref >60)
Glucose, Bld: 87 mg/dL (ref 70–99)
Potassium: 3.9 mmol/L (ref 3.5–5.1)
Sodium: 133 mmol/L — ABNORMAL LOW (ref 135–145)
Total Bilirubin: 0.7 mg/dL (ref 0.3–1.2)
Total Protein: 6.3 g/dL — ABNORMAL LOW (ref 6.5–8.1)

## 2021-04-16 LAB — LACTIC ACID, PLASMA: Lactic Acid, Venous: 1.2 mmol/L (ref 0.5–1.9)

## 2021-04-16 LAB — HCG, QUANTITATIVE, PREGNANCY: hCG, Beta Chain, Quant, S: 1 m[IU]/mL (ref <5)

## 2021-04-16 LAB — I-STAT BETA HCG BLOOD, ED (MC, WL, AP ONLY): I-stat hCG, quantitative: 39.5 m[IU]/mL — ABNORMAL HIGH (ref <5)

## 2021-04-16 LAB — APTT: aPTT: 21 seconds — ABNORMAL LOW (ref 24–36)

## 2021-04-16 MED ORDER — PIPERACILLIN-TAZOBACTAM 3.375 G IVPB
3.3750 g | Freq: Three times a day (TID) | INTRAVENOUS | Status: DC
  Administered 2021-04-17 – 2021-04-19 (×7): 3.375 g via INTRAVENOUS
  Filled 2021-04-16 (×8): qty 50

## 2021-04-16 MED ORDER — VANCOMYCIN HCL 1.25 G IV SOLR
1250.0000 mg | INTRAVENOUS | Status: DC
  Filled 2021-04-16: qty 25

## 2021-04-16 MED ORDER — LACTATED RINGERS IV BOLUS (SEPSIS)
1000.0000 mL | Freq: Once | INTRAVENOUS | Status: AC
  Administered 2021-04-16: 19:00:00 1000 mL via INTRAVENOUS

## 2021-04-16 MED ORDER — VANCOMYCIN HCL 10 G IV SOLR
1750.0000 mg | Freq: Once | INTRAVENOUS | Status: AC
  Administered 2021-04-16: 21:00:00 1750 mg via INTRAVENOUS
  Filled 2021-04-16: qty 1750

## 2021-04-16 MED ORDER — HYDROMORPHONE HCL 1 MG/ML IJ SOLN
1.0000 mg | Freq: Once | INTRAMUSCULAR | Status: AC
  Administered 2021-04-16: 19:00:00 1 mg via INTRAVENOUS
  Filled 2021-04-16: qty 1

## 2021-04-16 MED ORDER — FENTANYL CITRATE PF 50 MCG/ML IJ SOSY
50.0000 ug | PREFILLED_SYRINGE | INTRAMUSCULAR | Status: DC | PRN
  Administered 2021-04-16 – 2021-04-17 (×2): 50 ug via INTRAVENOUS
  Filled 2021-04-16 (×2): qty 1

## 2021-04-16 MED ORDER — HYDROMORPHONE HCL 1 MG/ML IJ SOLN
1.0000 mg | Freq: Once | INTRAMUSCULAR | Status: AC
  Administered 2021-04-16: 21:00:00 1 mg via INTRAVENOUS
  Filled 2021-04-16: qty 1

## 2021-04-16 MED ORDER — ERYTHROMYCIN 5 MG/GM OP OINT
1.0000 "application " | TOPICAL_OINTMENT | Freq: Four times a day (QID) | OPHTHALMIC | Status: AC
  Administered 2021-04-16 – 2021-04-21 (×19): 1 via OPHTHALMIC
  Filled 2021-04-16: qty 3.5

## 2021-04-16 MED ORDER — ACETAMINOPHEN 500 MG PO TABS
1000.0000 mg | ORAL_TABLET | Freq: Once | ORAL | Status: AC
  Administered 2021-04-16: 19:00:00 1000 mg via ORAL
  Filled 2021-04-16: qty 2

## 2021-04-16 MED ORDER — LACTATED RINGERS IV BOLUS
1000.0000 mL | Freq: Once | INTRAVENOUS | Status: AC
  Administered 2021-04-16: 20:00:00 1000 mL via INTRAVENOUS

## 2021-04-16 MED ORDER — PIPERACILLIN-TAZOBACTAM 3.375 G IVPB 30 MIN
3.3750 g | Freq: Once | INTRAVENOUS | Status: AC
  Administered 2021-04-16: 20:00:00 3.375 g via INTRAVENOUS
  Filled 2021-04-16: qty 50

## 2021-04-16 MED ORDER — IOHEXOL 300 MG/ML  SOLN
100.0000 mL | Freq: Once | INTRAMUSCULAR | Status: AC | PRN
  Administered 2021-04-16: 23:00:00 100 mL via INTRAVENOUS

## 2021-04-16 NOTE — ED Provider Notes (Signed)
[facility] EMERGENCY DEPARTMENT Provider Note   CSN: [id number] Arrival date & time: 04/16/21  1842     History CC: Pain under right breast  Shirley [name] is a 45 y.o. female with a history of type 2 diabetes, recurring abscess, chronic hepatitis B, reflux, obesity presenting the emergency department with complaint of pain under her right breast.  Patient reports onset of symptoms about 2 weeks ago, was being referred by her PCP for an ultrasound of the soft tissue of the breast next week, but came to the ED today because she was feeling so unwell.  She says she has a chronic boil under her right breast but feels that is significantly hardened and swollen for the past 2 to 3 days, now with some drainage.  She reports objective fevers and chills at home, headaches, nausea.  Today she woke up with some swelling around her left eye as well.  She is not currently on antibiotics.  HPI     Past Medical History:  Diagnosis Date   Abscess of tunica vaginalis    10/09- Abundant S. aureus- sensitive to all abx   Anxiety    Blood dyscrasia    CAD (coronary artery disease) 06/15/2006   s/p Subendocardial MI with PDA angioplasty(no stent) on 06/15/06 and relook  cath 06/19/06 showed patency of site. Cath 12/10- no restenosis or significant CAD progression   CVA (cerebral vascular accident) ([place]) ~ 02/2014   denies residual on 04/22/2014   CVA (cerebral vascular accident) [facility])    history of remote right cerebellar infarct noted on head CT at least since 10/2011   Depression    Diabetes mellitus type 2, uncontrolled, with complications    Fibromyalgia    Gastritis    Gastroparesis    secondary to poorly controlled DM, last emptying study performed 01/2010  was normal but may be falsely positive as pt was on reglan   GERD (gastroesophageal reflux disease)    Hepatitis B, chronic (HCC)    Hep BeAb+,Hep B cAb+ & Hep BsAg+ (9/06)   History of pyelonephritis    H/o GrpB  Pyelonephritis (9/06) and UTI- 07/11- E.Coli, 12/10- GBS   Hyperlipidemia    Hypertension    Iron deficiency anemia    Irregular menses    Small ovarian follicles seen on HL(4/56)   MI (myocardial infarction) ([place]) 05/2006   PDA percutaneous transluminal coronary angioplasty   Migraine    "weekly" (04/22/2014)   N&V (nausea and vomiting)    Chronic. Unclear etiology with multiple admission and ED visits. CT abdomen with and without contrast (02/2011)  showed no acute process. Gastic Emptying scan (01/2010) was normal. Ultrasound of the abdomen was within normal limits. Hepatitis B viral load was undectable. HIV NR. EGD - gastritis, Hpylori + s/p Rx   Obesity    OSA (obstructive sleep apnea)    "suppose to wear mask but I don't" (04/22/2014)   Peripheral neuropathy    Pneumonia    "this is probably the 2nd or 3rd time I've had pneumonia" (04/22/2014)   Recurrent boils    Seasonal asthma    Substance abuse ([place])    Thrombocytosis    Hem/Onc suggested 2/2 chronic hepatits and/or iron deficiency anemia    Patient Active Problem List   Diagnosis Date Noted   Abscess of bursa of right foot    Diabetic foot infection ([place]) 04/06/2020   Cellulitis of foot, right    Puncture wound of right foot  Sepsis due to pneumonia ([place]) 11/24/2019   Thrombocytosis 11/24/2019   Chronic pain 11/24/2019   History of pulmonary embolism 10/10/2017   Chronic anticoagulation 10/10/2017   GERD (gastroesophageal reflux disease) 10/10/2017   Leukocytosis 10/10/2017   Prolonged QT interval 10/10/2017   Nausea & vomiting 09/20/2017   Hypertensive urgency 09/20/2017   Intractable nausea and vomiting 08/08/2017   Chronic maxillary sinusitis 01/02/2017   Acute blood loss anemia 11/13/2016   Menorrhagia 11/13/2016   Bilateral pulmonary embolism ([place]) 10/30/2016   Acute DVT (deep venous thrombosis) (HCC) 10/30/2016   Nausea and vomiting 09/23/2016   Type 2 diabetes mellitus with hyperglycemia ([place])  04/07/2016   Diabetic gastroparesis ([place]) 04/06/2016   Dehydration 03/27/2015   Gastroparesis 11/11/2014   Hematemesis 10/13/2014   DKA (diabetic ketoacidoses) 10/13/2014   Abdominal pain, chronic, left lower quadrant    Diabetic gastroparesis associated with type 2 diabetes mellitus ([place]) 42/35/3614   History of Helicobacter pylori infection 04/22/2014   Vaginal discharge 02/18/2014   Unspecified constipation 07/21/2013   Tinea corporis 07/21/2013   Intractable vomiting 04/29/2013   Dysmenorrhea 04/22/2013   UTI (urinary tract infection) 07/15/2012   Headache(784.0) 02/08/2012   Health care maintenance 01/22/2012   Chronic hepatitis B ([place]) 03/07/2011   History of leukocytosis 04/06/2010   THROMBOCYTOSIS 04/06/2010   Polysubstance abuse ([place]) 02/23/2010   Iron deficiency anemia 11/22/2009   PERIPHERAL NEUROPATHY 10/01/2009   Hyperlipidemia 08/30/2009   Diabetic polyneuropathy ([place]) 08/30/2009   Hidradenitis (recurrent boils) 07/07/2008   Depression 12/27/2007   Abdominal pain, left lower quadrant 11/21/2007   FIBROMYALGIA 10/30/2007   BACK PAIN 04/01/2007   OBSTRUCTIVE SLEEP APNEA 01/17/2007   ANXIETY DEPRESSION 06/27/2006   Chronic ischemic heart disease 06/15/2006   OBESITY, MORBID 05/15/2006   MIGRAINE HEADACHE 05/15/2006   Asthma 05/15/2006   Essential hypertension 01/16/2006   IRREGULAR MENSTRUATION 01/16/2006   PEDAL EDEMA 01/16/2006   Poorly controlled type 2 diabetes mellitus with peripheral neuropathy ([place]) 01/16/1989    Past Surgical History:  Procedure Laterality Date   CESAREAN SECTION  1997   CORONARY ANGIOPLASTY WITH STENT PLACEMENT  2008   "2 stents"   ESOPHAGOGASTRODUODENOSCOPY N/A 04/23/2014   Procedure: ESOPHAGOGASTRODUODENOSCOPY (EGD);  Surgeon: Winfield Cunas., MD;  Location: [facility] ENDOSCOPY;  Service: Endoscopy;  Laterality: N/A;   IR ANGIOGRAM PELVIS SELECTIVE OR SUPRASELECTIVE  12/07/2016   IR ANGIOGRAM PELVIS SELECTIVE OR SUPRASELECTIVE   12/07/2016   IR ANGIOGRAM SELECTIVE EACH ADDITIONAL VESSEL  12/07/2016   IR ANGIOGRAM SELECTIVE EACH ADDITIONAL VESSEL  12/07/2016   IR EMBO ARTERIAL NOT HEMORR HEMANG INC GUIDE ROADMAPPING  12/07/2016   IR RADIOLOGIST EVAL & MGMT  01/09/2017   IR US GUIDE VASC ACCESS RIGHT  12/07/2016   IRRIGATION AND DEBRIDEMENT FOOT Right 04/07/2020   Procedure: Incision and Drainage Right Foot;  Surgeon: Evelina Bucy, DPM;  Location: [facility] ORS;  Service: Podiatry;  Laterality: Right;     OB History     Gravida  2   Para  2   Term  2   Preterm      AB      Living         SAB      IAB      Ectopic      Multiple      Live Births  2           Family History  Problem Relation Age of Onset   Diabetes Father    Healthy Mother  Social History   Tobacco Use   Smoking status: Former    Types: Cigarettes    Quit date: 04/24/1996    Years since quitting: 24.9   Smokeless tobacco: Never   Tobacco comments:    quit smoking cigarettes age 31  Vaping Use   Vaping Use: Never used  Substance Use Topics   Alcohol use: No    Alcohol/week: 0.0 standard drinks    Comment: 04/22/2014 "might have a few drinks a month"   Drug use: Not Currently    Types: Marijuana, Cocaine    Comment: 04/22/2104 "quit drugs ~ 1-2 yr ago"    Home Medications Prior to Admission medications   Medication Sig Start Date End Date Taking? Authorizing Provider  albuterol (VENTOLIN HFA) 108 (90 Base) MCG/ACT inhaler Inhale 2 puffs into the lungs every 4 (four) hours as needed for wheezing or shortness of breath. 11/30/19   Domenic Polite, MD  amLODipine (NORVASC) 10 MG tablet Take 10 mg by mouth daily. 10/02/19   [provider]  amLODipine (NORVASC) 5 MG tablet Take 5 mg by mouth daily. 12/26/19   [provider]  blood glucose meter kit and supplies KIT Dispense based on patient and insurance preference. Use up to four times daily as directed. (FOR ICD-9 250.00, 250.01). 01/04/20   Khatri, Hina,  PA-C  Blood Glucose Monitoring Suppl (ONETOUCH VERIO REFLECT) w/Device KIT 1 each by Does not apply route 3 (three) times daily. 2018-12-30   Kerin Perna, NP  cyclobenzaprine (FLEXERIL) 5 MG tablet Take 5-10 mg by mouth 3 (three) times daily as needed for muscle spasms. 11/05/19   [provider]  doxycycline (ADOXA) 100 MG tablet Take 100 mg by mouth 2 (two) times daily. 12/26/19   [provider]  fluconazole (DIFLUCAN) 150 MG tablet Take 150 mg by mouth once. 12/26/19   [provider]  glucose blood (ONETOUCH VERIO) test strip Use as instructed to check blood sugar 3 times daily. Dx codes: E11.8, E11.65 December 30, 2018   Charlott Rakes, MD  HUMALOG KWIKPEN 100 UNIT/ML KwikPen Inject 7 Units into the skin 4 (four) times daily -  before meals and at bedtime. Hold if blood glucose is less than 110 11/11/19   [provider]  ibuprofen (ADVIL) 800 MG tablet Take 800 mg by mouth 3 (three) times daily. 01/05/20   [provider]  insulin glargine (LANTUS SOLOSTAR) 100 UNIT/ML Solostar Pen Inject 30 Units into the skin daily. Patient taking differently: Inject 35 Units into the skin daily. 11/30/19   Domenic Polite, MD  Insulin Pen Needle (PEN NEEDLES 3/16") 31G X 5 MM MISC 1 Device by Does not apply route 4 (four) times daily - after meals and at bedtime. 11/28/18   Kerin Perna, NP  INSULIN SYRINGE .5CC/28G (INS SYRINGE/NEEDLE .5CC/28G) 28G X 1/2" 0.5 ML MISC Please provide 1 month supply 09/04/17   Colbert Ewing, MD  levocetirizine (XYZAL) 5 MG tablet Take 1 tablet (5 mg total) by mouth every evening. 11/28/18   Kerin Perna, NP  losartan (COZAAR) 100 MG tablet Take 100 mg by mouth daily. 03/31/20   [provider]  losartan-hydrochlorothiazide (HYZAAR) 100-25 MG tablet Take 1 tablet by mouth daily. 11/28/18   Kerin Perna, NP  ondansetron (ZOFRAN ODT) 4 MG disintegrating tablet Take 1 tablet (4 mg total) by mouth every 8 (eight) hours as  needed for nausea or vomiting. 01/04/20   Khatri, Hina, PA-C  OneTouch Delica Lancets 25D MISC Use to  check blood sugar 3 times daily. Dx codes: E11.8, E11.65 January 03, 2019   Charlott Rakes, MD  oxyCODONE-acetaminophen (PERCOCET) 10-325 MG tablet Take 1 tablet by mouth 4 (four) times daily as needed for pain. 11/05/19   [provider]  penicillin v potassium (VEETID) 500 MG tablet Take 500 mg by mouth 4 (four) times daily. 01/05/20   [provider]  pravastatin (PRAVACHOL) 80 MG tablet Take 80 mg by mouth daily. 11/05/19   [provider]  promethazine (PHENERGAN) 25 MG tablet Take 25 mg by mouth every 6 (six) hours as needed. 01/30/20   [provider]  silver sulfADIAZINE (SILVADENE) 1 % cream Apply pea-sized amount to wound daily. 07/06/20   Evelina Bucy, DPM  traMADol (ULTRAM) 50 MG tablet Take 1 tablet (50 mg total) by mouth every 6 (six) hours as needed. 03/27/20   Volney American, PA-C  traZODone (DESYREL) 50 MG tablet Take 0.5-1 tablets (25-50 mg total) by mouth at bedtime as needed for sleep. 11/28/18   Kerin Perna, NP  TRULICITY 4.5 WC/3.7SE SOPN Inject 4.5 mg into the skin once a week. 11/11/19   [provider]    Allergies    Lisinopril and Morphine and related  Review of Systems   Review of Systems  Constitutional:  Positive for chills and fever.  HENT:  Positive for facial swelling. Negative for congestion.   Respiratory:  Negative for cough and shortness of breath.   Cardiovascular:  Negative for chest pain and palpitations.  Gastrointestinal:  Positive for nausea. Negative for abdominal pain.  Genitourinary:  Negative for dysuria and hematuria.  Musculoskeletal:  Positive for myalgias. Negative for arthralgias.  Skin:  Positive for rash and wound.  Neurological:  Positive for headaches. Negative for syncope.  All other systems reviewed and are negative.  Physical Exam Updated Vital Signs BP (!) 150/86    Pulse (!) 107     Temp 99.6 F (37.6 C) (Oral)    Resp 14    Ht _0  (1.702 m)    Wt 94 kg    SpO2 94%    BMI 32.46 kg/m   Physical Exam Constitutional:      General: She is not in acute distress.    Appearance: She is obese.  HENT:     Head: Normocephalic and atraumatic.  Eyes:     Pupils: Pupils are equal, round, and reactive to light.     Comments: Conjunctival injection with mucous discharge bilaterally, edema of left eye  Cardiovascular:     Rate and Rhythm: Regular rhythm. Tachycardia present.  Pulmonary:     Effort: Pulmonary effort is normal. No respiratory distress.  Abdominal:     General: There is no distension.     Tenderness: There is no abdominal tenderness.  Skin:    General: Skin is warm and dry.     Comments: Induration and erythema approx 5 cm region underlying right breast on lower anterior chest wall, tender to palpation, with drainage  Neurological:     General: No focal deficit present.     Mental Status: She is alert. Mental status is at baseline.  Psychiatric:        Mood and Affect: Mood normal.        Behavior: Behavior normal.    ED Results / Procedures / Treatments   Labs (all labs ordered are listed, but only abnormal results are displayed) Labs Reviewed  COMPREHENSIVE METABOLIC PANEL - Abnormal; Notable for the following components:  Result Value   Sodium 133 (*)    CO2 20 (*)    Creatinine, Ser 1.62 (*)    Calcium 8.4 (*)    Total Protein 6.3 (*)    Albumin 1.8 (*)    GFR, Estimated 39 (*)    All other components within normal limits  CBC WITH DIFFERENTIAL/PLATELET - Abnormal; Notable for the following components:   WBC 24.2 (*)    Hemoglobin 11.4 (*)    HCT 32.4 (*)    MCV 78.6 (*)    Platelets 404 (*)    Neutro Abs 18.6 (*)    Monocytes Absolute 2.2 (*)    Abs Immature Granulocytes 0.16 (*)    All other components within normal limits  APTT - Abnormal; Notable for the following components:   aPTT 21 (*)    All other components within  normal limits  I-STAT BETA HCG BLOOD, ED ([facility], [facility], [facility] ONLY) - Abnormal; Notable for the following components:   I-stat hCG, quantitative 39.5 (*)    All other components within normal limits  CULTURE, BLOOD (ROUTINE X 2)  CULTURE, BLOOD (ROUTINE X 2)  RESP PANEL BY RT-PCR (FLU A&B, COVID) ARPGX2  LACTIC ACID, PLASMA  PROTIME-INR  LACTIC ACID, PLASMA  URINALYSIS, ROUTINE W REFLEX MICROSCOPIC  PREGNANCY, URINE  HCG, QUANTITATIVE, PREGNANCY    EKG EKG Interpretation  Date/Time:  Saturday April 16 2021 19:34:25 EST Ventricular Rate:  144 PR Interval:  129 QRS Duration: 74 QT Interval:  281 QTC Calculation: 435 R Axis:   73 Text Interpretation: Sinus tachycardia Low voltage, precordial leads Confirmed by Octaviano Glow [phone]) on 04/16/2021 7:38:31 PM  Radiology DG Chest Port 1 View  Result Date: 04/16/2021 CLINICAL DATA:  Questionable sepsis - evaluate for abnormality EXAM: PORTABLE CHEST 1 VIEW COMPARISON:  February 09, 2020 FINDINGS: The cardiomediastinal silhouette is unchanged in contour. No pleural effusion. No pneumothorax. No acute pleuroparenchymal abnormality. Visualized abdomen is unremarkable. IMPRESSION: No acute cardiopulmonary abnormality. Electronically Signed   By: Valentino Saxon M.D.   On: 04/16/2021 19:26    Procedures .Critical Care Performed by: Wyvonnia Dusky, MD Authorized by: Wyvonnia Dusky, MD   Critical care provider statement:    Critical care time (minutes):  45   Critical care time was exclusive of:  Separately billable procedures and treating other patients   Critical care was necessary to treat or prevent imminent or life-threatening deterioration of the following conditions:  Sepsis   Critical care was time spent personally by me on the following activities:  Ordering and performing treatments and interventions, ordering and review of laboratory studies, ordering and review of radiographic studies, pulse oximetry, review of old charts,  examination of patient and evaluation of patient's response to treatment   Medications Ordered in ED Medications  vancomycin (VANCOCIN) 1,750 mg in sodium chloride 0.9 % 500 mL IVPB (has no administration in time range)  erythromycin ophthalmic ointment 1 application (1 application Both Eyes Given 04/16/21 2059)  piperacillin-tazobactam (ZOSYN) IVPB 3.375 g (has no administration in time range)  Vancomycin (VANCOCIN) 1,250 mg in sodium chloride 0.9 % 250 mL IVPB (has no administration in time range)  lactated ringers bolus 1,000 mL (0 mLs Intravenous Stopped 04/16/21 2011)  acetaminophen (TYLENOL) tablet 1,000 mg (1,000 mg Oral Given 04/16/21 1916)  HYDROmorphone (DILAUDID) injection 1 mg (1 mg Intravenous Given 04/16/21 1914)  piperacillin-tazobactam (ZOSYN) IVPB 3.375 g (0 g Intravenous Stopped 04/16/21 2059)  lactated ringers bolus 1,000 mL (1,000 mLs Intravenous New Bag/Given  04/16/21 2019)  HYDROmorphone (DILAUDID) injection 1 mg (1 mg Intravenous Given 04/16/21 2057)    ED Course  I have reviewed the triage vital signs and the nursing notes.  Pertinent labs & imaging results that were available during my care of the patient were reviewed by me and considered in my medical decision making (see chart for details).  Patient is here with concern for possible right anterior chest wall abscess/breast abscess, also reporting fevers and chills and general malaise.  She is tachycardic on arrival.  I personally reviewed her medical records.  She does have a history of MRSA staph infection bacteremia in December 2021, resistant to clindamycin, erythromycin, and oxacillin.  It was susceptible to tetracycline and vancomycin.   Her presentation I have a high clinical concern for significant infection or sepsis.  I have ordered a fluid bolus, IV antibiotics with vancomycin and Zosyn empirically, as well as IV Dilaudid for her pain.  Blood test on arrival are suggestive of infection.  She also may  have conjunctivitis clinically, with mucoid discharge around both eyes and conjunctival injection.  I have ordered erythromycin ointment.  Clinical Course as of 04/16/21 2114  Sat Apr 16, 2021  1944 WBC(!): 24.2 [MT]  2103 CT ordered to evaluate for extent of abscess - patient will require admission for sepsis r/u.  Pt and family member at bedside updated.  I've ordered erythromycin ointment for what appears to be bilateral conjunctivitis with mucous discharge, eye pain beginning this morning. [MT]  2108 Signed out to Dr Darl Householder pending f/u on CT scan and admission for sepsis [MT]    Clinical Course User Index [MT] Langston Masker Carola Rhine, MD     Final Clinical Impression(s) / ED Diagnoses Final diagnoses:  Sepsis, due to unspecified organism, unspecified whether acute organ dysfunction present [facility])  Acute bacterial conjunctivitis of both eyes    Rx / DC Orders ED Discharge Orders     None        Wyvonnia Dusky, MD 04/16/21 2114

## 2021-04-16 NOTE — Progress Notes (Signed)
Pharmacy Antibiotic Note  Shirley [name] is a 45 y.o. female admitted on 04/16/2021 presenting with draining abscess, fever.  Pharmacy has been consulted for vancomycin and zosyn dosing.  Plan: Vancomycin 1750 mg IV x 1, then 1250 mg IV q 36 hours (eAUC 454, Goal AUC 400-550, SCr 1.62) Zosyn 3.375g IV q 8h (extended infusion) Monitor renal function, Cx and clinical progression to narrow Vancomycin levels as needed  Height: 5\' 7"  (170.2 cm) Weight: 94 kg (207 lb 3.7 oz) IBW/kg (Calculated) : 61.6  Temp (24hrs), Avg:99.7 F (37.6 C), Min:99.6 F (37.6 C), Max:99.8 F (37.7 C)  Recent Labs  Lab 04/16/21 1850  WBC 24.2*  CREATININE 1.62*    Estimated Creatinine Clearance: 50.6 mL/min (A) (by C-G formula based on SCr of 1.62 mg/dL (H)).    Allergies  Allergen Reactions   Lisinopril Nausea Only and Swelling   Morphine And Related Itching and Swelling    Bertis Ruddy, PharmD Clinical Pharmacist ED Pharmacist Phone # [phone] 04/16/2021 8:57 PM

## 2021-04-16 NOTE — ED Provider Notes (Signed)
°  Physical Exam  BP (!) 158/84    Pulse 94    Temp 99.6 F (37.6 C) (Oral)    Resp 13    Ht 5\' 7"  (1.702 m)    Wt 94 kg    SpO2 97%    BMI 32.46 kg/m   Physical Exam  ED Course/Procedures   Clinical Course as of 04/16/21 2348  Sat Apr 16, 2021  1944 WBC(!): 24.2 [MT]  2103 CT ordered to evaluate for extent of abscess - patient will require admission for sepsis r/u.  Pt and family member at bedside updated.  I've ordered erythromycin ointment for what appears to be bilateral conjunctivitis with mucous discharge, eye pain beginning this morning. [MT]  2108 Signed out to Dr Darl Householder pending f/u on CT scan and admission for sepsis [MT]    Clinical Course User Index [MT] Wyvonnia Dusky, MD    Procedures  MDM  Care assumed at 9 PM.  Patient has swelling under the right breast.  Patient meets sepsis criteria and white blood cell count is 24.  CT chest and pelvis was pending.  11:53 PM CT showed 3 x 4 cm abscess.  It is self draining already.  I consulted surgery (Dr. Zenia Resides) who states that she will see the patient.  At this point, will admit for IV antibiotics and Dr. Marlowe Sax will care for patient        Drenda Freeze, MD 04/16/21 2354

## 2021-04-16 NOTE — ED Triage Notes (Signed)
Pt BIB [facility] from home. Abscess under right breast x1 year, flares up occasionally. Worsened over past 3 days and is now draining. Low grade fever. EMS also notes swelling to eye lids, pt has taken benadryl with no relief. HX diabetes.

## 2021-04-17 ENCOUNTER — Inpatient Hospital Stay (HOSPITAL_COMMUNITY): Payer: Medicare Other | Admitting: Certified Registered"

## 2021-04-17 ENCOUNTER — Encounter (HOSPITAL_COMMUNITY)
Admission: EM | Disposition: A | Discharge status: Home Health | Payer: Self-pay | Source: Home / Self Care | Attending: Internal Medicine

## 2021-04-17 ENCOUNTER — Encounter (HOSPITAL_COMMUNITY): Payer: Self-pay | Admitting: Internal Medicine

## 2021-04-17 DIAGNOSIS — A419 Sepsis, unspecified organism: Secondary | ICD-10-CM

## 2021-04-17 DIAGNOSIS — H109 Unspecified conjunctivitis: Secondary | ICD-10-CM

## 2021-04-17 HISTORY — PX: INCISION AND DRAINAGE ABSCESS: SHX5864

## 2021-04-17 LAB — URINALYSIS, ROUTINE W REFLEX MICROSCOPIC
Bilirubin Urine: NEGATIVE
Glucose, UA: 100 mg/dL — AB
Ketones, ur: NEGATIVE mg/dL
Leukocytes,Ua: NEGATIVE
Nitrite: NEGATIVE
Protein, ur: >300 mg/dL — AB
Specific Gravity, Urine: 1.02 (ref 1.005–1.030)
pH: 6 (ref 5.0–8.0)

## 2021-04-17 LAB — CBC
HCT: 27.9 % — ABNORMAL LOW (ref 36.0–46.0)
Hemoglobin: 10.1 g/dL — ABNORMAL LOW (ref 12.0–15.0)
MCH: 27.9 pg (ref 26.0–34.0)
MCHC: 36.2 g/dL — ABNORMAL HIGH (ref 30.0–36.0)
MCV: 77.1 fL — ABNORMAL LOW (ref 80.0–100.0)
Platelets: 382 10*3/uL (ref 150–400)
RBC: 3.62 MIL/uL — ABNORMAL LOW (ref 3.87–5.11)
RDW: 14.3 % (ref 11.5–15.5)
WBC: 25.2 10*3/uL — ABNORMAL HIGH (ref 4.0–10.5)
nRBC: 0 % (ref 0.0–0.2)

## 2021-04-17 LAB — GLUCOSE, CAPILLARY
Glucose-Capillary: 83 mg/dL (ref 70–99)
Glucose-Capillary: 90 mg/dL (ref 70–99)
Glucose-Capillary: 129 mg/dL — ABNORMAL HIGH (ref 70–99)
Glucose-Capillary: 328 mg/dL — ABNORMAL HIGH (ref 70–99)
Glucose-Capillary: 301 mg/dL — ABNORMAL HIGH (ref 70–99)

## 2021-04-17 LAB — HEMOGLOBIN A1C
Hgb A1c MFr Bld: 13.5 % — ABNORMAL HIGH (ref 4.8–5.6)
Mean Plasma Glucose: 340.75 mg/dL

## 2021-04-17 LAB — URINALYSIS, MICROSCOPIC (REFLEX)

## 2021-04-17 LAB — HIV ANTIBODY (ROUTINE TESTING W REFLEX): HIV Screen 4th Generation wRfx: NONREACTIVE

## 2021-04-17 LAB — PREGNANCY, URINE: Preg Test, Ur: NEGATIVE

## 2021-04-17 SURGERY — INCISION AND DRAINAGE, ABSCESS
Anesthesia: Monitor Anesthesia Care | Site: Chest | Laterality: Right

## 2021-04-17 MED ORDER — GLYCOPYRROLATE PF 0.2 MG/ML IJ SOSY
PREFILLED_SYRINGE | INTRAMUSCULAR | Status: AC
  Filled 2021-04-17: qty 1

## 2021-04-17 MED ORDER — ACETAMINOPHEN 325 MG PO TABS
650.0000 mg | ORAL_TABLET | Freq: Four times a day (QID) | ORAL | Status: DC | PRN
  Administered 2021-04-19: 09:00:00 650 mg via ORAL
  Filled 2021-04-17: qty 2

## 2021-04-17 MED ORDER — INSULIN ASPART 100 UNIT/ML IJ SOLN
0.0000 [IU] | INTRAMUSCULAR | Status: DC
  Administered 2021-04-17: 12:00:00 1 [IU] via SUBCUTANEOUS
  Administered 2021-04-17 (×2): 7 [IU] via SUBCUTANEOUS
  Administered 2021-04-18 (×3): 3 [IU] via SUBCUTANEOUS

## 2021-04-17 MED ORDER — MIDAZOLAM HCL 2 MG/2ML IJ SOLN
INTRAMUSCULAR | Status: AC
  Filled 2021-04-17: qty 2

## 2021-04-17 MED ORDER — BUPIVACAINE HCL 0.25 % IJ SOLN: INTRAMUSCULAR | Status: DC | PRN

## 2021-04-17 MED ORDER — SODIUM CHLORIDE 0.9 % IV SOLN: INTRAVENOUS | Status: DC

## 2021-04-17 MED ORDER — BUPIVACAINE HCL (PF) 0.25 % IJ SOLN
INTRAMUSCULAR | Status: AC
  Filled 2021-04-17: qty 30

## 2021-04-17 MED ORDER — LACTATED RINGERS IV SOLN: INTRAVENOUS | Status: DC

## 2021-04-17 MED ORDER — GLYCOPYRROLATE 0.2 MG/ML IJ SOLN
INTRAMUSCULAR | Status: DC | PRN
  Administered 2021-04-17: .2 mg via INTRAVENOUS

## 2021-04-17 MED ORDER — 0.9 % SODIUM CHLORIDE (POUR BTL) OPTIME
TOPICAL | Status: DC | PRN
  Administered 2021-04-17: 07:00:00 1000 mL

## 2021-04-17 MED ORDER — LIDOCAINE HCL (CARDIAC) PF 100 MG/5ML IV SOSY
PREFILLED_SYRINGE | INTRAVENOUS | Status: DC | PRN
  Administered 2021-04-17: 100 mg via INTRATRACHEAL

## 2021-04-17 MED ORDER — AMISULPRIDE (ANTIEMETIC) 5 MG/2ML IV SOLN: 10.0000 mg | Freq: Once | INTRAVENOUS | Status: AC

## 2021-04-17 MED ORDER — AMLODIPINE BESYLATE 10 MG PO TABS
10.0000 mg | ORAL_TABLET | Freq: Every day | ORAL | Status: DC
  Administered 2021-04-19 – 2021-04-25 (×7): 10 mg via ORAL
  Filled 2021-04-17 (×10): qty 1

## 2021-04-17 MED ORDER — FENTANYL CITRATE (PF) 250 MCG/5ML IJ SOLN
INTRAMUSCULAR | Status: DC | PRN
  Administered 2021-04-17: 50 ug via INTRAVENOUS

## 2021-04-17 MED ORDER — CHLORHEXIDINE GLUCONATE 0.12 % MT SOLN
OROMUCOSAL | Status: AC
  Administered 2021-04-17: 08:00:00 15 mL via OROMUCOSAL
  Filled 2021-04-17: qty 15

## 2021-04-17 MED ORDER — LIDOCAINE 2% (20 MG/ML) 5 ML SYRINGE
INTRAMUSCULAR | Status: AC
  Filled 2021-04-17: qty 5

## 2021-04-17 MED ORDER — GABAPENTIN 400 MG PO CAPS
400.0000 mg | ORAL_CAPSULE | Freq: Three times a day (TID) | ORAL | Status: DC
  Administered 2021-04-17 – 2021-04-19 (×8): 400 mg via ORAL
  Filled 2021-04-17 (×8): qty 1

## 2021-04-17 MED ORDER — ACETAMINOPHEN 10 MG/ML IV SOLN
INTRAVENOUS | Status: AC
  Filled 2021-04-17: qty 100

## 2021-04-17 MED ORDER — HYDROMORPHONE HCL 1 MG/ML IJ SOLN
INTRAMUSCULAR | Status: AC
  Administered 2021-04-17: 10:00:00 0.5 mg via INTRAVENOUS
  Filled 2021-04-17: qty 1

## 2021-04-17 MED ORDER — AMISULPRIDE (ANTIEMETIC) 5 MG/2ML IV SOLN
INTRAVENOUS | Status: AC
  Administered 2021-04-17: 10:00:00 10 mg via INTRAVENOUS
  Filled 2021-04-17: qty 4

## 2021-04-17 MED ORDER — LACTATED RINGERS IV SOLN: INTRAVENOUS | Status: DC | PRN

## 2021-04-17 MED ORDER — PROPOFOL 10 MG/ML IV BOLUS
INTRAVENOUS | Status: AC
  Filled 2021-04-17: qty 20

## 2021-04-17 MED ORDER — BUPIVACAINE LIPOSOME 1.3 % IJ SUSP
INTRAMUSCULAR | Status: AC
  Filled 2021-04-17: qty 20

## 2021-04-17 MED ORDER — PROPOFOL 10 MG/ML IV BOLUS
INTRAVENOUS | Status: DC | PRN
  Administered 2021-04-17 (×2): 50 mg via INTRAVENOUS

## 2021-04-17 MED ORDER — LABETALOL HCL 5 MG/ML IV SOLN
5.0000 mg | INTRAVENOUS | Status: DC | PRN
  Administered 2021-04-17 (×2): 5 mg via INTRAVENOUS

## 2021-04-17 MED ORDER — PROPOFOL 500 MG/50ML IV EMUL
INTRAVENOUS | Status: DC | PRN
  Administered 2021-04-17: 200 ug/kg/min via INTRAVENOUS

## 2021-04-17 MED ORDER — BUPIVACAINE LIPOSOME 1.3 % IJ SUSP
INTRAMUSCULAR | Status: DC | PRN
  Administered 2021-04-17: 20 mL

## 2021-04-17 MED ORDER — MIDAZOLAM HCL 2 MG/2ML IJ SOLN
INTRAMUSCULAR | Status: DC | PRN
  Administered 2021-04-17: 2 mg via INTRAVENOUS

## 2021-04-17 MED ORDER — HYDROMORPHONE HCL 1 MG/ML IJ SOLN
0.2500 mg | INTRAMUSCULAR | Status: DC | PRN
  Administered 2021-04-17: 09:00:00 0.5 mg via INTRAVENOUS

## 2021-04-17 MED ORDER — CHLORHEXIDINE GLUCONATE 0.12 % MT SOLN: 15.0000 mL | Freq: Once | OROMUCOSAL | Status: AC

## 2021-04-17 MED ORDER — LABETALOL HCL 5 MG/ML IV SOLN
INTRAVENOUS | Status: AC
  Filled 2021-04-17: qty 4

## 2021-04-17 MED ORDER — ACETAMINOPHEN 650 MG RE SUPP: 650.0000 mg | Freq: Four times a day (QID) | RECTAL | Status: DC | PRN

## 2021-04-17 MED ORDER — LABETALOL HCL 5 MG/ML IV SOLN
INTRAVENOUS | Status: AC
  Administered 2021-04-17: 09:00:00 5 mg via INTRAVENOUS
  Filled 2021-04-17: qty 4

## 2021-04-17 MED ORDER — ACETAMINOPHEN 10 MG/ML IV SOLN
1000.0000 mg | Freq: Once | INTRAVENOUS | Status: AC
  Administered 2021-04-17: 10:00:00 1000 mg via INTRAVENOUS

## 2021-04-17 MED ORDER — FENTANYL CITRATE (PF) 250 MCG/5ML IJ SOLN
INTRAMUSCULAR | Status: AC
  Filled 2021-04-17: qty 5

## 2021-04-17 MED ORDER — ACETAMINOPHEN 10 MG/ML IV SOLN: 1000.0000 mg | Freq: Four times a day (QID) | INTRAVENOUS | Status: DC

## 2021-04-17 MED ORDER — LACTATED RINGERS IV BOLUS: 1000.0000 mL | Freq: Once | INTRAVENOUS | Status: DC

## 2021-04-17 MED ORDER — SODIUM CHLORIDE 0.9 % IR SOLN: Status: DC | PRN

## 2021-04-17 MED ORDER — LABETALOL HCL 5 MG/ML IV SOLN
5.0000 mg | INTRAVENOUS | Status: AC | PRN
  Administered 2021-04-17: 09:00:00 5 mg via INTRAVENOUS

## 2021-04-17 MED ORDER — HYDROMORPHONE HCL 1 MG/ML IJ SOLN
1.0000 mg | INTRAMUSCULAR | Status: DC | PRN
  Administered 2021-04-17 – 2021-04-19 (×10): 1 mg via INTRAVENOUS
  Filled 2021-04-17 (×11): qty 1

## 2021-04-17 SURGICAL SUPPLY — 34 items
BAG COUNTER SPONGE SURGICOUNT (BAG) ×2 IMPLANT
BNDG GAUZE ELAST 4 BULKY (GAUZE/BANDAGES/DRESSINGS) IMPLANT
CANISTER SUCT 3000ML PPV (MISCELLANEOUS) ×2 IMPLANT
COVER SURGICAL LIGHT HANDLE (MISCELLANEOUS) ×2 IMPLANT
DRAPE CHEST BREAST 15X10 FENES (DRAPES) ×1 IMPLANT
DRAPE LAPAROSCOPIC ABDOMINAL (DRAPES) IMPLANT
DRAPE LAPAROTOMY 100X72 PEDS (DRAPES) IMPLANT
DRSG PAD ABDOMINAL 8X10 ST (GAUZE/BANDAGES/DRESSINGS) IMPLANT
ELECT REM PT RETURN 9FT ADLT (ELECTROSURGICAL) ×2
ELECTRODE REM PT RTRN 9FT ADLT (ELECTROSURGICAL) ×1 IMPLANT
GAUZE PACKING IODOFORM 1X5 (PACKING) ×1 IMPLANT
GAUZE SPONGE 4X4 12PLY STRL (GAUZE/BANDAGES/DRESSINGS) IMPLANT
GAUZE SPONGE 4X4 12PLY STRL LF (GAUZE/BANDAGES/DRESSINGS) ×1 IMPLANT
GLOVE SURG ENC MOIS LTX SZ6.5 (GLOVE) ×2 IMPLANT
GLOVE SURG UNDER POLY LF SZ6 (GLOVE) ×2 IMPLANT
GOWN STRL REUS W/ TWL LRG LVL3 (GOWN DISPOSABLE) ×2 IMPLANT
GOWN STRL REUS W/TWL LRG LVL3 (GOWN DISPOSABLE) ×2
KIT BASIN OR (CUSTOM PROCEDURE TRAY) ×2 IMPLANT
KIT TURNOVER KIT B (KITS) ×2 IMPLANT
NDL 27GX1/2 REG BEVEL ECLIP (NEEDLE) IMPLANT
NDL HYPO 25GX1X1/2 BEV (NEEDLE) IMPLANT
NEEDLE 27GX1/2 REG BEVEL ECLIP (NEEDLE) ×2 IMPLANT
NEEDLE HYPO 25GX1X1/2 BEV (NEEDLE) ×2 IMPLANT
NS IRRIG 1000ML POUR BTL (IV SOLUTION) ×2 IMPLANT
PACK GENERAL/GYN (CUSTOM PROCEDURE TRAY) ×2 IMPLANT
PAD ABD 8X10 STRL (GAUZE/BANDAGES/DRESSINGS) ×1 IMPLANT
PAD ARMBOARD 7.5X6 YLW CONV (MISCELLANEOUS) ×2 IMPLANT
PENCIL SMOKE EVACUATOR (MISCELLANEOUS) ×2 IMPLANT
SWAB COLLECTION DEVICE MRSA (MISCELLANEOUS) ×1 IMPLANT
SWAB CULTURE ESWAB REG 1ML (MISCELLANEOUS) ×1 IMPLANT
SYR CONTROL 10ML LL (SYRINGE) ×1 IMPLANT
TAPE CLOTH SURG 4X10 WHT LF (GAUZE/BANDAGES/DRESSINGS) ×1 IMPLANT
TOWEL GREEN STERILE (TOWEL DISPOSABLE) ×2 IMPLANT
TOWEL GREEN STERILE FF (TOWEL DISPOSABLE) ×2 IMPLANT

## 2021-04-17 NOTE — Consult Note (Signed)
Shirley [name] 1975-11-11  [id number].    Requesting MD: Dr. Darl Householder Chief Complaint/Reason for Consult: abscess  HPI:  Shirley [name] is a 45 yo female who presented to the ED with malaise and pain under the right breast. Her symptoms began about 5 days ago and have been getting worse. She has not noticed any drainage from the area. Labs are significant for a WBC of 24. She has been started on broad-spectrum antibiotics in the ED. She was noted to have a wound near the right breast, and a CT scan was done which confirmed an abscess. General surgery has been consulted. On chart review the patient has had prior abscesses and cellulitis in other locations, including the foot. She has never had a mammogram and has no known family history of breast cancer.  ROS: Review of Systems  Constitutional:  Positive for malaise/fatigue. Negative for chills and fever.  Eyes:  Positive for pain and discharge.  Respiratory:  Negative for shortness of breath and wheezing.   Cardiovascular:  Positive for chest pain.   Family History  Problem Relation Age of Onset   Diabetes Father    Healthy Mother     Past Medical History:  Diagnosis Date   Abscess of tunica vaginalis    10/09- Abundant S. aureus- sensitive to all abx   Anxiety    Blood dyscrasia    CAD (coronary artery disease) 06/15/2006   s/p Subendocardial MI with PDA angioplasty(no stent) on 06/15/06 and relook  cath 06/19/06 showed patency of site. Cath 12/10- no restenosis or significant CAD progression   CVA (cerebral vascular accident) ([place]) ~ 02/2014   denies residual on 04/22/2014   CVA (cerebral vascular accident) [facility])    history of remote right cerebellar infarct noted on head CT at least since 10/2011   Depression    Diabetes mellitus type 2, uncontrolled, with complications    Fibromyalgia    Gastritis    Gastroparesis    secondary to poorly controlled DM, last emptying study performed 01/2010  was normal but may be falsely positive as  pt was on reglan   GERD (gastroesophageal reflux disease)    Hepatitis B, chronic (HCC)    Hep BeAb+,Hep B cAb+ & Hep BsAg+ (9/06)   History of pyelonephritis    H/o GrpB Pyelonephritis (9/06) and UTI- 07/11- E.Coli, 12/10- GBS   Hyperlipidemia    Hypertension    Iron deficiency anemia    Irregular menses    Small ovarian follicles seen on RC(7/89)   MI (myocardial infarction) ([place]) 05/2006   PDA percutaneous transluminal coronary angioplasty   Migraine    "weekly" (04/22/2014)   N&V (nausea and vomiting)    Chronic. Unclear etiology with multiple admission and ED visits. CT abdomen with and without contrast (02/2011)  showed no acute process. Gastic Emptying scan (01/2010) was normal. Ultrasound of the abdomen was within normal limits. Hepatitis B viral load was undectable. HIV NR. EGD - gastritis, Hpylori + s/p Rx   Obesity    OSA (obstructive sleep apnea)    "suppose to wear mask but I don't" (04/22/2014)   Peripheral neuropathy    Pneumonia    "this is probably the 2nd or 3rd time I've had pneumonia" (04/22/2014)   Recurrent boils    Seasonal asthma    Substance abuse ([place])    Thrombocytosis    Hem/Onc suggested 2/2 chronic hepatits and/or iron deficiency anemia    Past Surgical History:  Procedure Laterality Date   CESAREAN  SECTION  1997   CORONARY ANGIOPLASTY WITH STENT PLACEMENT  2008   "2 stents"   ESOPHAGOGASTRODUODENOSCOPY N/A 04/23/2014   Procedure: ESOPHAGOGASTRODUODENOSCOPY (EGD);  Surgeon: Winfield Cunas., MD;  Location: [facility] ENDOSCOPY;  Service: Endoscopy;  Laterality: N/A;   IR ANGIOGRAM PELVIS SELECTIVE OR SUPRASELECTIVE  12/07/2016   IR ANGIOGRAM PELVIS SELECTIVE OR SUPRASELECTIVE  12/07/2016   IR ANGIOGRAM SELECTIVE EACH ADDITIONAL VESSEL  12/07/2016   IR ANGIOGRAM SELECTIVE EACH ADDITIONAL VESSEL  12/07/2016   IR EMBO ARTERIAL NOT HEMORR HEMANG INC GUIDE ROADMAPPING  12/07/2016   IR RADIOLOGIST EVAL & MGMT  01/09/2017   IR US GUIDE VASC ACCESS RIGHT   12/07/2016   IRRIGATION AND DEBRIDEMENT FOOT Right 04/07/2020   Procedure: Incision and Drainage Right Foot;  Surgeon: Evelina Bucy, DPM;  Location: [facility] ORS;  Service: Podiatry;  Laterality: Right;    Social History:  reports that she quit smoking about 24 years ago. Her smoking use included cigarettes. She has never used smokeless tobacco. She reports that she does not currently use drugs after having used the following drugs: Marijuana and Cocaine. She reports that she does not drink alcohol.  Allergies:  Allergies  Allergen Reactions   Lisinopril Nausea Only and Swelling   Morphine And Related Itching and Swelling    (Not in a hospital admission)    Physical Exam: Blood pressure (!) 158/84, pulse 94, temperature 99.6 F (37.6 C), temperature source Oral, resp. rate 13, height 5\' 7"  (1.702 m), weight 94 kg, SpO2 100 %. General: resting comfortably, appears stated age, no apparent distress Neurological: alert and oriented, no focal deficits, cranial nerves grossly in tact HEENT: normocephalic, atraumatic, left periorbital swelling with mild exudate CV: regular rate and rhythm, extremities warm and well-perfused Respiratory: normal work of breathing, lungs clear to auscultation bilaterally, symmetric chest wall expansion Breast/chest: Erythema with significant induration and skin sloughing on the right lower chest wall, just inferior to the inframammary crease. The area is very tender to palpation, but no purulent drainage is expressed. Extremities: warm and well-perfused, no deformities, moving all extremities spontaneously Psychiatric: normal mood and affect Skin: warm and dry, no jaundice, no rashes or lesions   Results for orders placed or performed during the hospital encounter of 04/16/21 (from the past 48 hour(s))  Lactic acid, plasma     Status: None   Collection Time: 04/16/21  6:50 PM  Result Value Ref Range   Lactic Acid, Venous 1.2 0.5 - 1.9 mmol/L    Comment:  Performed at [facility], [address] [place], [place] [address]  Comprehensive metabolic panel     Status: Abnormal   Collection Time: 04/16/21  6:50 PM  Result Value Ref Range   Sodium 133 (L) 135 - 145 mmol/L   Potassium 3.9 3.5 - 5.1 mmol/L   Chloride 104 98 - 111 mmol/L   CO2 20 (L) 22 - 32 mmol/L   Glucose, Bld 87 70 - 99 mg/dL    Comment: Glucose reference range applies only to samples taken after fasting for at least 8 hours.   BUN 16 6 - 20 mg/dL   Creatinine, Ser 1.62 (H) 0.44 - 1.00 mg/dL   Calcium 8.4 (L) 8.9 - 10.3 mg/dL   Total Protein 6.3 (L) 6.5 - 8.1 g/dL   Albumin 1.8 (L) 3.5 - 5.0 g/dL   AST 28 15 - 41 U/L   ALT 10 0 - 44 U/L   Alkaline Phosphatase 124 38 - 126 U/L  Total Bilirubin 0.7 0.3 - 1.2 mg/dL   GFR, Estimated 39 (L) >60 mL/min    Comment: (NOTE) Calculated using the CKD-EPI Creatinine Equation (2021)    Anion gap 9 5 - 15    Comment: Performed at [facility] [address], [place], [place] [address]  CBC WITH DIFFERENTIAL     Status: Abnormal   Collection Time: 04/16/21  6:50 PM  Result Value Ref Range   WBC 24.2 (H) 4.0 - 10.5 K/uL   RBC 4.12 3.87 - 5.11 MIL/uL   Hemoglobin 11.4 (L) 12.0 - 15.0 g/dL   HCT 32.4 (L) 36.0 - 46.0 %   MCV 78.6 (L) 80.0 - 100.0 fL   MCH 27.7 26.0 - 34.0 pg   MCHC 35.2 30.0 - 36.0 g/dL   RDW 14.2 11.5 - 15.5 %   Platelets 404 (H) 150 - 400 K/uL   nRBC 0.0 0.0 - 0.2 %   Neutrophils Relative % 77 %   Neutro Abs 18.6 (H) 1.7 - 7.7 K/uL   Lymphocytes Relative 12 %   Lymphs Abs 3.0 0.7 - 4.0 K/uL   Monocytes Relative 9 %   Monocytes Absolute 2.2 (H) 0.1 - 1.0 K/uL   Eosinophils Relative 1 %   Eosinophils Absolute 0.2 0.0 - 0.5 K/uL   Basophils Relative 0 %   Basophils Absolute 0.1 0.0 - 0.1 K/uL   Immature Granulocytes 1 %   Abs Immature Granulocytes 0.16 (H) 0.00 - 0.07 K/uL    Comment: Performed at [facility] [address] [place] [address]  Protime-INR     Status: None    Collection Time: 04/16/21  6:50 PM  Result Value Ref Range   Prothrombin Time 14.5 11.4 - 15.2 seconds   INR 1.1 0.8 - 1.2    Comment: (NOTE) INR goal varies based on device and disease states. Performed at [facility], [place] [address] [place], [place] [address]   APTT     Status: Abnormal   Collection Time: 04/16/21  6:50 PM  Result Value Ref Range   aPTT 21 (L) 24 - 36 seconds    Comment: Performed at [facility] [address], [place], [place] [address]  I-Stat beta hCG blood, ED     Status: Abnormal   Collection Time: 04/16/21  7:12 PM  Result Value Ref Range   I-stat hCG, quantitative 39.5 (H) <5 mIU/mL   Comment 3            Comment:   GEST. AGE      CONC.  (mIU/mL)   <=1 WEEK        5 - 50     2 WEEKS       50 - 500     3 WEEKS       100 - 10,000     4 WEEKS     1,000 - 30,000        FEMALE AND NON-PREGNANT FEMALE:     LESS THAN 5 mIU/mL   Resp Panel by RT-PCR (Flu A&B, Covid) Nasopharyngeal Swab     Status: None   Collection Time: 04/16/21  7:35 PM   Specimen: Nasopharyngeal Swab; Nasopharyngeal(NP) swabs in vial transport medium  Result Value Ref Range   SARS Coronavirus 2 by RT PCR NEGATIVE NEGATIVE    Comment: (NOTE) SARS-CoV-2 target nucleic acids are NOT DETECTED.  The SARS-CoV-2 RNA is generally detectable in upper respiratory specimens during the  acute phase of infection. The lowest concentration of SARS-CoV-2 viral copies this assay can detect is 138 copies/mL. A negative result does not preclude SARS-Cov-2 infection and should not be used as the sole basis for treatment or other patient management decisions. A negative result may occur with  improper specimen collection/handling, submission of specimen other than nasopharyngeal swab, presence of viral mutation(s) within the areas targeted by this assay, and inadequate number of viral copies(<138 copies/mL). A negative result must be combined with clinical observations, patient history, and  epidemiological information. The expected result is Negative.  Fact Sheet for Patients:  EntrepreneurPulse.com.au  Fact Sheet for Healthcare Providers:  IncredibleEmployment.be  This test is no t yet approved or cleared by the [place] FDA and  has been authorized for detection and/or diagnosis of SARS-CoV-2 by FDA under an Emergency Use Authorization (EUA). This EUA will remain  in effect (meaning this test can be used) for the duration of the COVID-19 declaration under Section 564(b)(1) of the Act, 21 U.S.C.section 360bbb-3(b)(1), unless the authorization is terminated  or revoked sooner.       Influenza A by PCR NEGATIVE NEGATIVE   Influenza B by PCR NEGATIVE NEGATIVE    Comment: (NOTE) The Xpert Xpress SARS-CoV-2/FLU/RSV plus assay is intended as an aid in the diagnosis of influenza from Nasopharyngeal swab specimens and should not be used as a sole basis for treatment. Nasal washings and aspirates are unacceptable for Xpert Xpress SARS-CoV-2/FLU/RSV testing.  Fact Sheet for Patients: EntrepreneurPulse.com.au  Fact Sheet for Healthcare Providers: IncredibleEmployment.be  This test is not yet approved or cleared by the [place] FDA and has been authorized for detection and/or diagnosis of SARS-CoV-2 by FDA under an Emergency Use Authorization (EUA). This EUA will remain in effect (meaning this test can be used) for the duration of the COVID-19 declaration under Section 564(b)(1) of the Act, 21 U.S.C. section 360bbb-3(b)(1), unless the authorization is terminated or revoked.  Performed at [facility], [place] [address], [place], [place] [address]   hCG, quantitative, pregnancy     Status: None   Collection Time: 04/16/21  9:09 PM  Result Value Ref Range   hCG, Beta Chain, Quant, S 1 <5 mIU/mL    Comment:          GEST. AGE      CONC.  (mIU/mL)   <=1 WEEK        5 - 50     2 WEEKS        50 - 500     3 WEEKS       100 - 10,000     4 WEEKS     1,000 - 30,000     5 WEEKS     3,500 - 115,000   6-8 WEEKS     12,000 - 270,000    12 WEEKS     15,000 - 220,000        FEMALE AND NON-PREGNANT FEMALE:     LESS THAN 5 mIU/mL Performed at [facility], [place] [address] [place], [place] [address]    CT CHEST ABDOMEN PELVIS W CONTRAST  Result Date: 04/16/2021 CLINICAL DATA:  Sepsis pain beneath the right breast EXAM: CT CHEST, ABDOMEN, AND PELVIS WITH CONTRAST TECHNIQUE: Multidetector CT imaging of the chest, abdomen and pelvis was performed following the standard protocol during bolus administration of intravenous contrast. CONTRAST:  146mL OMNIPAQUE IOHEXOL 300 MG/ML  SOLN COMPARISON:  CT abdomen pelvis 09/21/2017, chest x-ray 04/16/2021, CT  chest 11/07/2016 FINDINGS: CT CHEST FINDINGS Cardiovascular: Nonaneurysmal aorta. Normal cardiac size. No pericardial effusion. Mediastinum/Nodes: Midline trachea. No thyroid mass. No suspicious mediastinal lymph nodes. Enlarged right axillary lymph nodes measuring up to 15 mm. Esophagus within normal limits. Lungs/Pleura: Left upper lobe bronchiectasis with associated stellate density at left apex, no significant change since 2018 and presumably scarring. Other areas of scarring are visualized in the left upper lobe/lingula. 23 posterior pleural thickening with possible mild calcification on the left suggestive of pleural plaques. Musculoskeletal: Sternum is intact. Thoracic vertebral bodies demonstrate normal stature. Subcutaneous edema within the right greater than left chest wall. Gas and fluid collection within the right anterolateral chest wall, inferior to the right breast, this measures approximately 3.8 by 2.9 cm and would be consistent with soft tissue abscess. Moderate gas tracking within the right lateral chest wall soft tissues posteriorly. Edema and soft tissue stranding extend to the right upper quadrant abdominal wall subcutaneous fat.  CT ABDOMEN PELVIS FINDINGS Hepatobiliary: No focal liver abnormality is seen. No gallstones, gallbladder wall thickening, or biliary dilatation. Pancreas: Unremarkable. No pancreatic ductal dilatation or surrounding inflammatory changes. Spleen: Normal in size without focal abnormality. Adrenals/Urinary Tract: Adrenal glands are unremarkable. Kidneys are normal, without renal calculi, focal lesion, or hydronephrosis. Bladder is unremarkable. Stomach/Bowel: Stomach nonenlarged. No dilated small bowel. No acute bowel wall thickening. Negative appendix. Vascular/Lymphatic: Mild atherosclerosis. No aneurysm. No suspicious nodes Reproductive: Lobulated uterus with probable fibroids. Uterus is deviated to the right pelvis. 3.8 by 2 cm low-density left adnexal lesion, not entirely consistent with simple cyst. Other: Negative for pelvic effusion or free air. Musculoskeletal: No acute or suspicious osseous abnormality. Advanced degenerative changes at L5-S1. IMPRESSION: 1. Edema and soft tissue stranding within the right greater than left mid to lower chest wall with evidence for a 3.8 x 2.9 cm gas and fluid collection in the subcutaneous fat consistent with soft tissue abscess. Moderate gas tracking posteriorly along the right lateral chest wall. Soft tissue inflammatory changes extend to the right upper quadrant anterior abdominal wall. 2. Mild right axillary adenopathy, likely reactive 3. No CT evidence for acute intra-abdominal or pelvic abnormality. Fibroid uterus. 4. 3.8 cm low-density indeterminate left adnexal lesion. Suggest correlation with non emergent pelvic ultrasound. Electronically Signed   By: Donavan Foil M.D.   On: 04/16/2021 23:04   DG Chest Port 1 View  Result Date: 04/16/2021 CLINICAL DATA:  Questionable sepsis - evaluate for abnormality EXAM: PORTABLE CHEST 1 VIEW COMPARISON:  February 09, 2020 FINDINGS: The cardiomediastinal silhouette is unchanged in contour. No pleural effusion. No pneumothorax.  No acute pleuroparenchymal abnormality. Visualized abdomen is unremarkable. IMPRESSION: No acute cardiopulmonary abnormality. Electronically Signed   By: Valentino Saxon M.D.   On: 04/16/2021 19:26      Assessment/Plan This is a 45 yo female with sepsis secondary to an abscess of the right lower chest wall. Her CT shows a collection approximately 3cm in diameter in the subcutaneous tissues, and does not appear to involve the breast tissue. Patient is exquisitely tender on exam and will likely not tolerate a bedside debridement, thus I recommended debridement in the OR. - Plan for OR in am for incision and drainage - Please keep NPO for procedure, ok for sips with meds - Continue broad-spectrum antibiotics, will send cultures intraoperatively - Remainder of care per primary team, surgery will follow   Michaelle Birks, [facility], Hepatobiliary and Pancreatic Surgery 04/17/21 2:12 AM

## 2021-04-17 NOTE — Anesthesia Procedure Notes (Signed)
Procedure Name: MAC Date/Time: 04/17/2021 7:51 AM Performed by: Claris Che, CRNA Pre-anesthesia Checklist: Patient identified, Emergency Drugs available, Suction available, Patient being monitored and Timeout performed Oxygen Delivery Method: Simple face mask Dental Injury: Teeth and Oropharynx as per pre-operative assessment

## 2021-04-17 NOTE — H&P (Signed)
History and Physical    Shirley [name] FYB:[id number] DOB: July 10, 1975 DOA: 04/16/2021  PCP: Fredrich Romans, PA Patient coming from: Home  Chief Complaint: Pain under right breast  HPI: Shirley [name] is a 45 y.o. female with medical history significant of poorly controlled insulin-dependent type 2 diabetes (A1c 12.3 a year ago), recurrent abscesses, hypertension, medical noncompliance, CKD stage IIIa, OSA with CPAP, CAD, CVA, anxiety, depression, chronic hep B, hypertension, hyperlipidemia, substance abuse presented to the ED for evaluation of abscess under her right breast with drainage.  On arrival to the ED, afebrile but tachycardic and tachypneic.  Hypertensive with blood pressure 191/118.  Labs revealed WBC 24.2.  Hemoglobin 11.4, stable.  Creatinine 1.6 (baseline 1.2).  Lactic acid 1.2.  Blood culture x2 drawn.  CT showing edema and soft tissue stranding within the right greater than left mid to lower chest wall with evidence of a 3.8 x 2.9 cm gas and fluid collection in the subcutaneous fat consistent with soft tissue abscess.  Moderate gas tracking posteriorly along the right lateral chest wall.  Soft tissue inflammatory changes extend to the right upper quadrant abdominal wall.  Patient was given Tylenol, Dilaudid, 2 L LR boluses, Zosyn, and vancomycin.  General surgery (Dr. Zenia Resides) saw the patient and planning on taking her to the OR for I&D in the morning.  Patient also noted to have conjunctivitis and prescribed erythromycin ointment.  Patient states she has an abscess underneath her right breast which started 5 days ago.  She noticed some clear drainage 2 days ago but it is no longer draining.  Reporting fevers and chills.  Reporting severe 10 out of 10 intensity pain and moaning.  States she had an abscess underneath her opposite breast a year ago.  She uses Lantus 65 units daily along with NovoLog 15 units with meals.  States her last A1c was 12.  No additional history could be  obtained.  Review of Systems:  All systems reviewed and apart from history of presenting illness, are negative.  Past Medical History:  Diagnosis Date   Abscess of tunica vaginalis    10/09- Abundant S. aureus- sensitive to all abx   Anxiety    Blood dyscrasia    CAD (coronary artery disease) 06/15/2006   s/p Subendocardial MI with PDA angioplasty(no stent) on 06/15/06 and relook  cath 06/19/06 showed patency of site. Cath 12/10- no restenosis or significant CAD progression   CVA (cerebral vascular accident) ([place]) ~ 02/2014   denies residual on 04/22/2014   CVA (cerebral vascular accident) [facility])    history of remote right cerebellar infarct noted on head CT at least since 10/2011   Depression    Diabetes mellitus type 2, uncontrolled, with complications    Fibromyalgia    Gastritis    Gastroparesis    secondary to poorly controlled DM, last emptying study performed 01/2010  was normal but may be falsely positive as pt was on reglan   GERD (gastroesophageal reflux disease)    Hepatitis B, chronic (HCC)    Hep BeAb+,Hep B cAb+ & Hep BsAg+ (9/06)   History of pyelonephritis    H/o GrpB Pyelonephritis (9/06) and UTI- 07/11- E.Coli, 12/10- GBS   Hyperlipidemia    Hypertension    Iron deficiency anemia    Irregular menses    Small ovarian follicles seen on NI(7/78)   MI (myocardial infarction) ([place]) 05/2006   PDA percutaneous transluminal coronary angioplasty   Migraine    "weekly" (04/22/2014)   N&V (nausea  and vomiting)    Chronic. Unclear etiology with multiple admission and ED visits. CT abdomen with and without contrast (02/2011)  showed no acute process. Gastic Emptying scan (01/2010) was normal. Ultrasound of the abdomen was within normal limits. Hepatitis B viral load was undectable. HIV NR. EGD - gastritis, Hpylori + s/p Rx   Obesity    OSA (obstructive sleep apnea)    "suppose to wear mask but I don't" (04/22/2014)   Peripheral neuropathy    Pneumonia    "this is probably  the 2nd or 3rd time I've had pneumonia" (04/22/2014)   Recurrent boils    Seasonal asthma    Substance abuse ([place])    Thrombocytosis    Hem/Onc suggested 2/2 chronic hepatits and/or iron deficiency anemia    Past Surgical History:  Procedure Laterality Date   [place]  2008   "2 stents"   ESOPHAGOGASTRODUODENOSCOPY N/A 04/23/2014   Procedure: ESOPHAGOGASTRODUODENOSCOPY (EGD);  Surgeon: Winfield Cunas., MD;  Location: [facility] ENDOSCOPY;  Service: Endoscopy;  Laterality: N/A;   IR ANGIOGRAM PELVIS SELECTIVE OR SUPRASELECTIVE  12/07/2016   IR ANGIOGRAM PELVIS SELECTIVE OR SUPRASELECTIVE  12/07/2016   IR ANGIOGRAM SELECTIVE EACH ADDITIONAL VESSEL  12/07/2016   IR ANGIOGRAM SELECTIVE EACH ADDITIONAL VESSEL  12/07/2016   IR EMBO ARTERIAL NOT HEMORR HEMANG INC GUIDE ROADMAPPING  12/07/2016   IR RADIOLOGIST EVAL & MGMT  01/09/2017   IR US GUIDE VASC ACCESS RIGHT  12/07/2016   IRRIGATION AND DEBRIDEMENT FOOT Right 04/07/2020   Procedure: Incision and Drainage Right Foot;  Surgeon: Evelina Bucy, DPM;  Location: [facility];  Service: Podiatry;  Laterality: Right;     reports that she quit smoking about 24 years ago. Her smoking use included cigarettes. She has never used smokeless tobacco. She reports that she does not currently use drugs after having used the following drugs: Marijuana and Cocaine. She reports that she does not drink alcohol.  Allergies  Allergen Reactions   Lisinopril Nausea Only and Swelling   Morphine And Related Itching and Swelling    Family History  Problem Relation Age of Onset   Diabetes Father    Healthy Mother     Prior to Admission medications   Medication Sig Start Date End Date Taking? Authorizing Provider  acetaminophen (TYLENOL) 500 MG tablet Take 1,000 mg by mouth every 6 (six) hours as needed for moderate pain.   Yes [provider]  albuterol (VENTOLIN HFA) 108 (90 Base) MCG/ACT inhaler  Inhale 2 puffs into the lungs every 4 (four) hours as needed for wheezing or shortness of breath. 11/30/19  Yes Domenic Polite, MD  amLODipine (NORVASC) 10 MG tablet Take 10 mg by mouth daily. 10/02/19  Yes [provider]  blood glucose meter kit and supplies KIT Dispense based on patient and insurance preference. Use up to four times daily as directed. (FOR ICD-9 250.00, 250.01). 01/04/20  Yes Khatri, Hina, PA-C  Blood Glucose Monitoring Suppl (ONETOUCH VERIO REFLECT) w/Device KIT 1 each by Does not apply route 3 (three) times daily. 12/04/18  Yes Kerin Perna, NP  cyclobenzaprine (FLEXERIL) 5 MG tablet Take 5-10 mg by mouth 3 (three) times daily as needed for muscle spasms. 11/05/19  Yes [provider]  diphenhydrAMINE (BENADRYL) 25 MG tablet Take 25 mg by mouth every 6 (six) hours as needed for allergies.   Yes [provider]  famotidine (PEPCID) 20 MG tablet Take 20 mg by  mouth every evening. 04/11/21  Yes [provider]  gabapentin (NEURONTIN) 400 MG capsule Take 400 mg by mouth 3 (three) times daily. 03/10/21  Yes [provider]  glucose blood (ONETOUCH VERIO) test strip Use as instructed to check blood sugar 3 times daily. Dx codes: E11.8, E11.65 12/12/2018  Yes Charlott Rakes, MD  HUMALOG KWIKPEN 100 UNIT/ML KwikPen Inject 15 Units into the skin 4 (four) times daily -  before meals and at bedtime. Hold if blood glucose is less than 110 11/11/19  Yes [provider]  insulin glargine (LANTUS SOLOSTAR) 100 UNIT/ML Solostar Pen Inject 30 Units into the skin daily. Patient taking differently: Inject 65 Units into the skin daily. 11/30/19  Yes Domenic Polite, MD  Insulin Pen Needle (PEN NEEDLES 3/16") 31G X 5 MM MISC 1 Device by Does not apply route 4 (four) times daily - after meals and at bedtime. 11/28/18  Yes Kerin Perna, NP  INSULIN SYRINGE .5CC/28G (INS SYRINGE/NEEDLE .5CC/28G) 28G X 1/2" 0.5 ML MISC Please provide 1 month supply  09/04/17  Yes Colbert Ewing, MD  losartan (COZAAR) 100 MG tablet Take 100 mg by mouth daily. 03/31/20  Yes [provider]  omeprazole (PRILOSEC) 40 MG capsule Take 40 mg by mouth daily. 03/10/21  Yes [provider]  OneTouch Delica Lancets 93T MISC Use to check blood sugar 3 times daily. Dx codes: E11.8, E11.65 Dec 12, 2018  Yes Charlott Rakes, MD  oxyCODONE-acetaminophen (PERCOCET) 10-325 MG tablet Take 1 tablet by mouth 4 (four) times daily as needed for pain. 11/05/19  Yes [provider]  atorvastatin (LIPITOR) 20 MG tablet Take 20 mg by mouth at bedtime. 04/11/21   [provider]  levocetirizine (XYZAL) 5 MG tablet Take 1 tablet (5 mg total) by mouth every evening. Patient not taking: Reported on 04/17/2021 11/28/18   Kerin Perna, NP  losartan-hydrochlorothiazide (HYZAAR) 100-25 MG tablet Take 1 tablet by mouth daily. Patient not taking: Reported on 04/17/2021 11/28/18   Kerin Perna, NP  ondansetron (ZOFRAN ODT) 4 MG disintegrating tablet Take 1 tablet (4 mg total) by mouth every 8 (eight) hours as needed for nausea or vomiting. Patient not taking: Reported on 04/17/2021 01/04/20   Delia Heady, PA-C  silver sulfADIAZINE (SILVADENE) 1 % cream Apply pea-sized amount to wound daily. Patient not taking: Reported on 04/17/2021 07/06/20   Evelina Bucy, DPM  traMADol (ULTRAM) 50 MG tablet Take 1 tablet (50 mg total) by mouth every 6 (six) hours as needed. Patient not taking: Reported on 04/17/2021 03/27/20   Volney American, PA-C  traZODone (DESYREL) 50 MG tablet Take 0.5-1 tablets (25-50 mg total) by mouth at bedtime as needed for sleep. Patient not taking: Reported on 04/17/2021 11/28/18   Kerin Perna, NP  TRULICITY 4.5 DS/2.8JG SOPN Inject 4.5 mg into the skin once a week. Patient not taking: Reported on 04/17/2021 11/11/19   [provider]    Physical Exam: Vitals:   04/17/21 0315 04/17/21 0330 04/17/21 0345 04/17/21 0530   BP: (!) 169/86 (!) 143/52 (!) 170/97 (!) 167/104  Pulse: (!) 102 (!) 103 (!) 109 (!) 110  Resp: 14 (!) 29 20 16   Temp:    (!) 100.8 F (38.2 C)  TempSrc:    Oral  SpO2: 97% 96% 96% 95%  Weight:      Height:        Physical Exam Constitutional:      Appearance: She is not diaphoretic.  HENT:  Head: Normocephalic and atraumatic.  Eyes:     Comments: Left eye: Eyelid swollen, conjunctival injection with purulent drainage Right eye: Slight purulent drainage  Cardiovascular:     Rate and Rhythm: Normal rate and regular rhythm.     Pulses: Normal pulses.  Pulmonary:     Effort: Pulmonary effort is normal. No respiratory distress.     Breath sounds: Normal breath sounds. No wheezing or rales.  Abdominal:     General: Bowel sounds are normal.     Palpations: Abdomen is soft.  Musculoskeletal:        General: No swelling or tenderness.     Cervical back: Normal range of motion and neck supple.  Skin:    General: Skin is warm and dry.     Comments: Right lower chest wall: Erythema with significant induration and skin sloughing.  Area very tender to palpation.  No active drainage.  Neurological:     General: No focal deficit present.     Mental Status: She is alert and oriented to person, place, and time.     Labs on Admission: I have personally reviewed following labs and imaging studies  CBC: Recent Labs  Lab 04/16/21 1850  WBC 24.2*  NEUTROABS 18.6*  HGB 11.4*  HCT 32.4*  MCV 78.6*  PLT 323*   Basic Metabolic Panel: Recent Labs  Lab 04/16/21 1850  NA 133*  K 3.9  CL 104  CO2 20*  GLUCOSE 87  BUN 16  CREATININE 1.62*  CALCIUM 8.4*   GFR: Estimated Creatinine Clearance: 50.6 mL/min (A) (by C-G formula based on SCr of 1.62 mg/dL (H)). Liver Function Tests: Recent Labs  Lab 04/16/21 1850  AST 28  ALT 10  ALKPHOS 124  BILITOT 0.7  PROT 6.3*  ALBUMIN 1.8*   No results for input(s): LIPASE, AMYLASE in the last 168 hours. No results for input(s):  AMMONIA in the last 168 hours. Coagulation Profile: Recent Labs  Lab 04/16/21 1850  INR 1.1   Cardiac Enzymes: No results for input(s): CKTOTAL, CKMB, CKMBINDEX, TROPONINI in the last 168 hours. BNP (last 3 results) No results for input(s): PROBNP in the last 8760 hours. HbA1C: No results for input(s): HGBA1C in the last 72 hours. CBG: No results for input(s): GLUCAP in the last 168 hours. Lipid Profile: No results for input(s): CHOL, HDL, LDLCALC, TRIG, CHOLHDL, LDLDIRECT in the last 72 hours. Thyroid Function Tests: No results for input(s): TSH, T4TOTAL, FREET4, T3FREE, THYROIDAB in the last 72 hours. Anemia Panel: No results for input(s): VITAMINB12, FOLATE, FERRITIN, TIBC, IRON, RETICCTPCT in the last 72 hours. Urine analysis:    Component Value Date/Time   COLORURINE YELLOW 01/03/2020 2045   APPEARANCEUR TURBID (A) 01/03/2020 2045   LABSPEC 1.025 01/03/2020 2045   PHURINE 6.5 01/03/2020 2045   GLUCOSEU >=500 (A) 01/03/2020 2045   GLUCOSEU > 1000 mg/dL (A) 07/07/2008 2115   HGBUR SMALL (A) 01/03/2020 2045   HGBUR negative 08/12/2008 1444   BILIRUBINUR SMALL (A) 01/03/2020 2045   BILIRUBINUR neg 02/07/2017 1609   KETONESUR 15 (A) 01/03/2020 2045   PROTEINUR >300 (A) 01/03/2020 2045   UROBILINOGEN 1.0 02/07/2017 1609   UROBILINOGEN 0.2 11/11/2014 1658   NITRITE NEGATIVE 01/03/2020 2045   LEUKOCYTESUR NEGATIVE 01/03/2020 2045    Radiological Exams on Admission: CT CHEST ABDOMEN PELVIS W CONTRAST  Result Date: 04/16/2021 CLINICAL DATA:  Sepsis pain beneath the right breast EXAM: CT CHEST, ABDOMEN, AND PELVIS WITH CONTRAST TECHNIQUE: Multidetector CT imaging of the chest, abdomen  and pelvis was performed following the standard protocol during bolus administration of intravenous contrast. CONTRAST:  158m OMNIPAQUE IOHEXOL 300 MG/ML  SOLN COMPARISON:  CT abdomen pelvis 09/21/2017, chest x-ray 04/16/2021, CT chest 11/07/2016 FINDINGS: CT CHEST FINDINGS Cardiovascular:  Nonaneurysmal aorta. Normal cardiac size. No pericardial effusion. Mediastinum/Nodes: Midline trachea. No thyroid mass. No suspicious mediastinal lymph nodes. Enlarged right axillary lymph nodes measuring up to 15 mm. Esophagus within normal limits. Lungs/Pleura: Left upper lobe bronchiectasis with associated stellate density at left apex, no significant change since 2018 and presumably scarring. Other areas of scarring are visualized in the left upper lobe/lingula. 23 posterior pleural thickening with possible mild calcification on the left suggestive of pleural plaques. Musculoskeletal: Sternum is intact. Thoracic vertebral bodies demonstrate normal stature. Subcutaneous edema within the right greater than left chest wall. Gas and fluid collection within the right anterolateral chest wall, inferior to the right breast, this measures approximately 3.8 by 2.9 cm and would be consistent with soft tissue abscess. Moderate gas tracking within the right lateral chest wall soft tissues posteriorly. Edema and soft tissue stranding extend to the right upper quadrant abdominal wall subcutaneous fat. CT ABDOMEN PELVIS FINDINGS Hepatobiliary: No focal liver abnormality is seen. No gallstones, gallbladder wall thickening, or biliary dilatation. Pancreas: Unremarkable. No pancreatic ductal dilatation or surrounding inflammatory changes. Spleen: Normal in size without focal abnormality. Adrenals/Urinary Tract: Adrenal glands are unremarkable. Kidneys are normal, without renal calculi, focal lesion, or hydronephrosis. Bladder is unremarkable. Stomach/Bowel: Stomach nonenlarged. No dilated small bowel. No acute bowel wall thickening. Negative appendix. Vascular/Lymphatic: Mild atherosclerosis. No aneurysm. No suspicious nodes Reproductive: Lobulated uterus with probable fibroids. Uterus is deviated to the right pelvis. 3.8 by 2 cm low-density left adnexal lesion, not entirely consistent with simple cyst. Other: Negative for pelvic  effusion or free air. Musculoskeletal: No acute or suspicious osseous abnormality. Advanced degenerative changes at L5-S1. IMPRESSION: 1. Edema and soft tissue stranding within the right greater than left mid to lower chest wall with evidence for a 3.8 x 2.9 cm gas and fluid collection in the subcutaneous fat consistent with soft tissue abscess. Moderate gas tracking posteriorly along the right lateral chest wall. Soft tissue inflammatory changes extend to the right upper quadrant anterior abdominal wall. 2. Mild right axillary adenopathy, likely reactive 3. No CT evidence for acute intra-abdominal or pelvic abnormality. Fibroid uterus. 4. 3.8 cm low-density indeterminate left adnexal lesion. Suggest correlation with non emergent pelvic ultrasound. Electronically Signed   By: KDonavan FoilM.D.   On: 04/16/2021 23:04   DG Chest Port 1 View  Result Date: 04/16/2021 CLINICAL DATA:  Questionable sepsis - evaluate for abnormality EXAM: PORTABLE CHEST 1 VIEW COMPARISON:  February 09, 2020 FINDINGS: The cardiomediastinal silhouette is unchanged in contour. No pleural effusion. No pneumothorax. No acute pleuroparenchymal abnormality. Visualized abdomen is unremarkable. IMPRESSION: No acute cardiopulmonary abnormality. Electronically Signed   By: SValentino SaxonM.D.   On: 04/16/2021 19:26    EKG: Independently reviewed.  Sinus tachycardia.  Assessment/Plan Principal Problem:   Abscess Active Problems:   Poorly controlled type 2 diabetes mellitus with peripheral neuropathy (HCC)   Essential hypertension   Sepsis ([place]   Conjunctivitis   Sepsis secondary to lower chest wall abscess Met sepsis criteria on arrival to the ED with tachycardia, tachypnea, and leukocytosis (WBC 24.2). CT showing edema and soft tissue stranding within the right greater than left mid to lower chest wall with evidence of a 3.8 x 2.9 cm gas and fluid collection in the subcutaneous  fat consistent with soft tissue abscess.  Moderate  gas tracking posteriorly along the right lateral chest wall.  Soft tissue inflammatory changes extend to the right upper quadrant abdominal wall. -General surgery planning on taking her to the OR this morning for I&D.  Intraoperative cultures will be obtained.  Keep n.p.o. Continue vancomycin and Zosyn.  She was given 2 L IV fluid boluses in the ED, still tachycardic.  Will give additional 1 L bolus.  Dilaudid as needed for severe pain.  Blood culture x2 pending.  Monitor WBC count.  AKI on CKD stage IIIa Likely prerenal in the setting of sepsis.  Creatinine 1.6, baseline 1.2. -IV fluid hydration and continue to monitor renal function.  Avoid nephrotoxic agents/hold home losartan and hydrochlorothiazide.  Left adnexal lesion CT showing a 3.8 cm low density indeterminate left adnexal lesion. -Will need nonemergent pelvic ultrasound for further evaluation.  Bacterial conjunctivitis -Continue erythromycin ointment  Poorly controlled insulin-dependent type 2 diabetes A1c 12.3 a year ago and history of medical noncompliance. -Repeat A1c.  Sliding scale insulin sensitive every 4 hours as patient is currently n.p.o. Resume home basal and scheduled preprandial insulin after surgery.  Hypertension Blood pressure improved. -Continue amlodipine  Diabetic neuropathy -Continue gabapentin  OSA -Continue nightly CPAP  CAD Not endorsing any anginal symptoms.  DVT prophylaxis: SCDs Code Status: Full code Family Communication: Diagnostic findings and treatment plan discussed with the patient.  No family available at this time. Disposition Plan: Status is: Inpatient  Remains inpatient appropriate because: Needs IV antibiotics for several days  Level of care: Level of care: Telemetry Medical  The medical decision making on this patient was of high complexity and the patient is at high risk for clinical deterioration, therefore this is a level 3 visit.  Shela Leff MD [facility]  If 7PM-7AM, please contact night-coverage [email or web address]  04/17/2021, 6:00 AM

## 2021-04-17 NOTE — Anesthesia Preprocedure Evaluation (Addendum)
Anesthesia Evaluation  Patient identified by MRN, date of birth, ID band Patient awake    Reviewed: Allergy & Precautions, H&P , NPO status , Patient's Chart, lab work & pertinent test results  Airway Mallampati: II  TM Distance: >3 FB Neck ROM: Full    Dental no notable dental hx. (+) Teeth Intact, Dental Advisory Given   Pulmonary asthma , sleep apnea , former smoker,    Pulmonary exam normal breath sounds clear to auscultation       Cardiovascular hypertension, Pt. on medications + Past MI   Rhythm:Regular Rate:Normal     Neuro/Psych  Headaches, Anxiety Depression    GI/Hepatic Neg liver ROS, GERD  Medicated,  Endo/Other  diabetes, Poorly Controlled, Insulin Dependent  Renal/GU negative Renal ROS  negative genitourinary   Musculoskeletal  (+) Fibromyalgia -  Abdominal   Peds  Hematology  (+) Blood dyscrasia, anemia ,   Anesthesia Other Findings   Reproductive/Obstetrics negative OB ROS                            Anesthesia Physical Anesthesia Plan  ASA: 3  Anesthesia Plan: MAC   Post-op Pain Management:    Induction: Intravenous, Rapid sequence and Cricoid pressure planned  PONV Risk Score and Plan: 3 and Ondansetron, Dexamethasone, Midazolam and Propofol infusion  Airway Management Planned: Simple Face Mask and Natural Airway  Additional Equipment:   Intra-op Plan:   Post-operative Plan:   Informed Consent: I have reviewed the patients History and Physical, chart, labs and discussed the procedure including the risks, benefits and alternatives for the proposed anesthesia with the patient or authorized representative who has indicated his/her understanding and acceptance.     Dental advisory given  Plan Discussed with: CRNA  Anesthesia Plan Comments:        Anesthesia Quick Evaluation

## 2021-04-17 NOTE — Progress Notes (Signed)
Patient ID: Shirley [name], female   DOB: 1976/01/20, 45 y.o.   MRN: [id number] Patient admitted early this morning for sepsis secondary to lower chest wall abscess and has been started on IV antibiotics.  General surgery was consulted and planning for I&D this morning. I have reviewed patient's medical records including this morning's H&P, current vitals, medications and labs myself.  Continue IV fluids and antibiotics.  Follow cultures.  Follow further general surgery recommendations.  Repeat a.m. labs.

## 2021-04-17 NOTE — Progress Notes (Signed)
Arrived to 3W from ER on stretcher. Able to walk to room and went to bathroom, then ambulated to bed. Pt tearful and c/o 10/10 pain. Vitals obtained and PRN IV dilaudid given then OR staff arrived to take pt to pre-op. Report given to [facility], RN in short stay. Pt taken to short stay via bed with husband Shirley [name] at bedside and all belongings including pt's cell phone and purse.

## 2021-04-17 NOTE — Op Note (Signed)
° °  Operative Note   Date: 04/17/2021  Procedure: incision and drainage of right chest wall abscess  Pre-op diagnosis: right chest wall abscess Post-op diagnosis: same  Indication and clinical history: The patient is a 45 y.o. year old female with a right chest wall abscess     Surgeon: Jesusita Oka, MD  Anesthesiologist: Ola Spurr, MD Anesthesia: General  Findings:  Specimen: aerobic, anaerobic cultures, abscess fluid for culture EBL: <10cc Drains/Implants: iodoform packing  Disposition: PACU - hemodynamically stable.  Description of procedure: The patient was positioned supine on the operating room table. General anesthetic induction and intubation were uneventful. Foley catheter insertion was performed and was atraumatic. Time-out was performed verifying correct patient, procedure, signature of informed consent, and administration of pre-operative antibiotics. The right chest wall was prepped and draped in the usual sterile fashion.  An elliptical incision was made and a copious amount of pus extruded. Aerobic and anaerobic swabs as well as some of the fluid all sent for culture. Blunt finger fracture dissection was performed of the wound to release all pus pockets. The wound was packed with one inch iodoform gauze and sterile dressings applied.  All sponge and instrument counts were correct at the conclusion of the procedure. The patient was awakened from anesthesia, extubated uneventfully, and transported to the PACU in good condition. There were no complications.   Upon entering the wound, I encountered an abscess in the chest wall .  CASE DATA:  Type of patient?: DOW CASE (Surgical Hospitalist [facility])  Status of Case? URGENT Add On  Infection Present At Time Of Surgery (PATOS)?  ABSCESS in the chest wall    Jesusita Oka, MD General and [place] [facility]

## 2021-04-17 NOTE — Anesthesia Postprocedure Evaluation (Signed)
Anesthesia Post Note  Patient: Laneisha N Tomkiewicz  Procedure(s) Performed: INCISION AND DRAINAGE CHEST WALL ABSCESS (Right: Chest)     Patient location during evaluation: PACU Anesthesia Type: MAC Level of consciousness: awake and alert Pain management: pain level controlled Vital Signs Assessment: post-procedure vital signs reviewed and stable Respiratory status: spontaneous breathing, nonlabored ventilation and respiratory function stable Cardiovascular status: stable and blood pressure returned to baseline Postop Assessment: no apparent nausea or vomiting Anesthetic complications: no   No notable events documented.  Last Vitals:  Vitals:   04/17/21 1018 04/17/21 1033  BP: 135/78 137/76  Pulse: (!) 134 (!) 125  Resp: 16 15  Temp: (!) 39.5 C (!) 39 C  SpO2: 98% 98%    Last Pain:  Vitals:   04/17/21 1033  TempSrc:   PainSc: 0-No pain                 Michale Weikel,W. EDMOND

## 2021-04-17 NOTE — Transfer of Care (Signed)
Immediate Anesthesia Transfer of Care Note  Patient: Shirley [name]  Procedure(s) Performed: INCISION AND DRAINAGE CHEST WALL ABSCESS (Right: Chest)  Patient Location: PACU  Anesthesia Type:MAC  Level of Consciousness: oriented, drowsy, patient cooperative and responds to stimulation  Airway & Oxygen Therapy: Patient Spontanous Breathing and Patient connected to nasal cannula oxygen  Post-op Assessment: Report given to RN, Post -op Vital signs reviewed and stable and Patient moving all extremities X 4  Post vital signs: Reviewed and stable  Last Vitals:  Vitals Value Taken Time  BP 135/61 04/17/21 0833  Temp    Pulse 115 04/17/21 0838  Resp 16 04/17/21 0838  SpO2 99 % 04/17/21 0838  Vitals shown include unvalidated device data.  Last Pain:  Vitals:   04/17/21 0647  TempSrc: Oral  PainSc: 10-Worst pain ever         Complications: No notable events documented.

## 2021-04-17 NOTE — Progress Notes (Signed)
Seen and examined. R chest wall abscess. Plan for I&D. Informed consent was obtained after detailed explanation of risks, including bleeding, infection, hematoma/seroma, temporary or permanent neuropathy, recurrence, and persistent infection requiring additional surgery. All questions answered to the patient's satisfaction.  Jesusita Oka, MD General and [facility]

## 2021-04-18 ENCOUNTER — Encounter (HOSPITAL_COMMUNITY): Payer: Self-pay | Admitting: Surgery

## 2021-04-18 DIAGNOSIS — I10 Essential (primary) hypertension: Secondary | ICD-10-CM

## 2021-04-18 LAB — BASIC METABOLIC PANEL
Anion gap: 12 (ref 5–15)
BUN: 26 mg/dL — ABNORMAL HIGH (ref 6–20)
CO2: 20 mmol/L — ABNORMAL LOW (ref 22–32)
Calcium: 8.1 mg/dL — ABNORMAL LOW (ref 8.9–10.3)
Chloride: 101 mmol/L (ref 98–111)
Creatinine, Ser: 2.96 mg/dL — ABNORMAL HIGH (ref 0.44–1.00)
GFR, Estimated: 19 mL/min — ABNORMAL LOW (ref >60)
Glucose, Bld: 252 mg/dL — ABNORMAL HIGH (ref 70–99)
Potassium: 4.4 mmol/L (ref 3.5–5.1)
Sodium: 133 mmol/L — ABNORMAL LOW (ref 135–145)

## 2021-04-18 LAB — CBC WITH DIFFERENTIAL/PLATELET
Abs Immature Granulocytes: 0.13 10*3/uL — ABNORMAL HIGH (ref 0.00–0.07)
Basophils Absolute: 0.1 10*3/uL (ref 0.0–0.1)
Basophils Relative: 0 %
Eosinophils Absolute: 0.4 10*3/uL (ref 0.0–0.5)
Eosinophils Relative: 2 %
HCT: 31.3 % — ABNORMAL LOW (ref 36.0–46.0)
Hemoglobin: 10.6 g/dL — ABNORMAL LOW (ref 12.0–15.0)
Immature Granulocytes: 1 %
Lymphocytes Relative: 12 %
Lymphs Abs: 2.8 10*3/uL (ref 0.7–4.0)
MCH: 26.7 pg (ref 26.0–34.0)
MCHC: 33.9 g/dL (ref 30.0–36.0)
MCV: 78.8 fL — ABNORMAL LOW (ref 80.0–100.0)
Monocytes Absolute: 2.1 10*3/uL — ABNORMAL HIGH (ref 0.1–1.0)
Monocytes Relative: 9 %
Neutro Abs: 17.4 10*3/uL — ABNORMAL HIGH (ref 1.7–7.7)
Neutrophils Relative %: 76 %
Platelets: 441 10*3/uL — ABNORMAL HIGH (ref 150–400)
RBC: 3.97 MIL/uL (ref 3.87–5.11)
RDW: 14.5 % (ref 11.5–15.5)
WBC: 22.9 10*3/uL — ABNORMAL HIGH (ref 4.0–10.5)
nRBC: 0 % (ref 0.0–0.2)

## 2021-04-18 LAB — GLUCOSE, CAPILLARY
Glucose-Capillary: 201 mg/dL — ABNORMAL HIGH (ref 70–99)
Glucose-Capillary: 223 mg/dL — ABNORMAL HIGH (ref 70–99)
Glucose-Capillary: 224 mg/dL — ABNORMAL HIGH (ref 70–99)
Glucose-Capillary: 226 mg/dL — ABNORMAL HIGH (ref 70–99)
Glucose-Capillary: 239 mg/dL — ABNORMAL HIGH (ref 70–99)
Glucose-Capillary: 314 mg/dL — ABNORMAL HIGH (ref 70–99)
Glucose-Capillary: 90 mg/dL (ref 70–99)

## 2021-04-18 LAB — MAGNESIUM: Magnesium: 1.9 mg/dL (ref 1.7–2.4)

## 2021-04-18 MED ORDER — INSULIN GLARGINE-YFGN 100 UNIT/ML ~~LOC~~ SOLN
30.0000 [IU] | Freq: Every day | SUBCUTANEOUS | Status: DC
  Administered 2021-04-18 – 2021-04-24 (×7): 30 [IU] via SUBCUTANEOUS
  Filled 2021-04-18 (×8): qty 0.3

## 2021-04-18 MED ORDER — VANCOMYCIN HCL 1250 MG/250ML IV SOLN
1250.0000 mg | INTRAVENOUS | Status: DC
Start: 2021-04-18 — End: 2021-04-18
  Filled 2021-04-18: qty 250

## 2021-04-18 MED ORDER — INSULIN ASPART 100 UNIT/ML IJ SOLN
0.0000 [IU] | Freq: Three times a day (TID) | INTRAMUSCULAR | Status: DC
Start: 2021-04-18 — End: 2021-04-26
  Administered 2021-04-18: 19:00:00 11 [IU] via SUBCUTANEOUS
  Administered 2021-04-18: 13:00:00 5 [IU] via SUBCUTANEOUS
  Administered 2021-04-19: 18:00:00 3 [IU] via SUBCUTANEOUS
  Administered 2021-04-19 (×2): 2 [IU] via SUBCUTANEOUS
  Administered 2021-04-20 (×2): 5 [IU] via SUBCUTANEOUS
  Administered 2021-04-21 (×2): 3 [IU] via SUBCUTANEOUS
  Administered 2021-04-22: 14:00:00 2 [IU] via SUBCUTANEOUS

## 2021-04-18 MED ORDER — LIVING WELL WITH DIABETES BOOK
Freq: Once | Status: AC
Start: 2021-04-18 — End: 2021-04-18
  Filled 2021-04-18: qty 1

## 2021-04-18 MED ORDER — INSULIN ASPART 100 UNIT/ML IJ SOLN
5.0000 [IU] | Freq: Three times a day (TID) | INTRAMUSCULAR | Status: DC
Start: 2021-04-18 — End: 2021-04-25
  Administered 2021-04-18 – 2021-04-21 (×9): 5 [IU] via SUBCUTANEOUS

## 2021-04-18 MED ORDER — INSULIN ASPART 100 UNIT/ML IJ SOLN
0.0000 [IU] | Freq: Every day | INTRAMUSCULAR | Status: DC
  Administered 2021-04-18: 22:00:00 2 [IU] via SUBCUTANEOUS

## 2021-04-18 NOTE — Progress Notes (Signed)
Subjective No acute events. Soreness about chest wall improved and different relative to preop. No longer throbbing pain. Denies complaints at present.   Objective: Vital signs in last 24 hours: Temp:  [98.1 F (36.7 C)-103.1 F (39.5 C)] 99.2 F (37.3 C) (12/26 0816) Pulse Rate:  [93-167] 117 (12/26 0816) Resp:  [10-26] 18 (12/26 0800) BP: (117-185)/(70-146) 150/76 (12/26 0816) SpO2:  [94 %-100 %] 100 % (12/26 0816)    Intake/Output from previous day: 12/25 0701 - 12/26 0700 In: 600 [I.V.:600] Out: 405 [Urine:400; Blood:5] Intake/Output this shift: No intake/output data recorded.  Gen: NAD, comfortable CV: RRR Pulm: Normal work of breathing; packing in place right chest wall; dressed and dry. Relative to description 12/25 by Dr. Zenia Resides, erythema regressing Abd: Soft, NT/ND  Ext: SCDs in place  Her nurse tech was present as a chaperone for todays examination  Lab Results: CBC  Recent Labs    04/17/21 1322 04/18/21 0748  WBC 25.2* 22.9*  HGB 10.1* 10.6*  HCT 27.9* 31.3*  PLT 382 441*   BMET Recent Labs    04/16/21 1850  NA 133*  K 3.9  CL 104  CO2 20*  GLUCOSE 87  BUN 16  CREATININE 1.62*  CALCIUM 8.4*   PT/INR Recent Labs    04/16/21 1850  LABPROT 14.5  INR 1.1   ABG No results for input(s): PHART, HCO3 in the last 72 hours.  Invalid input(s): PCO2, PO2  Studies/Results:  Anti-infectives: Anti-infectives (From admission, onward)    Start     Dose/Rate Route Frequency Ordered Stop   04/18/21 1000  Vancomycin (VANCOCIN) 1,250 mg in sodium chloride 0.9 % 250 mL IVPB  Status:  Discontinued        1,250 mg 166.7 mL/hr over 90 Minutes Intravenous Every 36 hours 04/16/21 2101 04/18/21 0127   04/18/21 1000  vancomycin (VANCOREADY) IVPB 1250 mg/250 mL        1,250 mg 166.7 mL/hr over 90 Minutes Intravenous Every 36 hours 04/18/21 0127     04/17/21 0600  piperacillin-tazobactam (ZOSYN) IVPB 3.375 g        3.375 g 12.5 mL/hr over 240 Minutes  Intravenous Every 8 hours 04/16/21 2059     04/16/21 2000  vancomycin (VANCOCIN) 1,750 mg in sodium chloride 0.9 % 500 mL IVPB        1,750 mg 250 mL/hr over 120 Minutes Intravenous  Once 04/16/21 1947 04/17/21 0038   04/16/21 2000  piperacillin-tazobactam (ZOSYN) IVPB 3.375 g        3.375 g 100 mL/hr over 30 Minutes Intravenous  Once 04/16/21 1947 04/16/21 2059        Assessment/Plan: Patient Active Problem List   Diagnosis Date Noted   Sepsis ([place]) 04/17/2021   Conjunctivitis 04/17/2021   Abscess 04/16/2021   Abscess of bursa of right foot    Diabetic foot infection ([place]) 04/06/2020   Cellulitis of foot, right    Puncture wound of right foot    Sepsis due to pneumonia ([place]) 11/24/2019   Thrombocytosis 11/24/2019   Chronic pain 11/24/2019   History of pulmonary embolism 10/10/2017   Chronic anticoagulation 10/10/2017   GERD (gastroesophageal reflux disease) 10/10/2017   Leukocytosis 10/10/2017   Prolonged QT interval 10/10/2017   Nausea & vomiting 09/20/2017   Hypertensive urgency 09/20/2017   Intractable nausea and vomiting 08/08/2017   Chronic maxillary sinusitis 01/02/2017   Acute blood loss anemia 11/13/2016   Menorrhagia 11/13/2016   Bilateral pulmonary embolism ([place]) 10/30/2016   Acute DVT (deep  venous thrombosis) ([place]) 10/30/2016   Nausea and vomiting 09/23/2016   Type 2 diabetes mellitus with hyperglycemia ([place]) 04/07/2016   Diabetic gastroparesis ([place]) 04/06/2016   Dehydration 03/27/2015   Gastroparesis 11/11/2014   Hematemesis 10/13/2014   DKA (diabetic ketoacidoses) 10/13/2014   Abdominal pain, chronic, left lower quadrant    Diabetic gastroparesis associated with type 2 diabetes mellitus ([place]) 29/92/4268   History of Helicobacter pylori infection 04/22/2014   Vaginal discharge 02/18/2014   Unspecified constipation 07/21/2013   Tinea corporis 07/21/2013   Intractable vomiting 04/29/2013   Dysmenorrhea 04/22/2013   UTI (urinary tract infection)  07/15/2012   Headache(784.0) 02/08/2012   Health care maintenance 01/22/2012   Chronic hepatitis B ([place]) 03/07/2011   History of leukocytosis 04/06/2010   THROMBOCYTOSIS 04/06/2010   Polysubstance abuse ([place]) 02/23/2010   Iron deficiency anemia 11/22/2009   PERIPHERAL NEUROPATHY 10/01/2009   Hyperlipidemia 08/30/2009   Diabetic polyneuropathy ([place]) 08/30/2009   Hidradenitis (recurrent boils) 07/07/2008   Depression 12/27/2007   Abdominal pain, left lower quadrant 11/21/2007   FIBROMYALGIA 10/30/2007   BACK PAIN 04/01/2007   OBSTRUCTIVE SLEEP APNEA 01/17/2007   ANXIETY DEPRESSION 06/27/2006   Chronic ischemic heart disease 06/15/2006   OBESITY, MORBID 05/15/2006   MIGRAINE HEADACHE 05/15/2006   Asthma 05/15/2006   Essential hypertension 01/16/2006   IRREGULAR MENSTRUATION 01/16/2006   PEDAL EDEMA 01/16/2006   Poorly controlled type 2 diabetes mellitus with peripheral neuropathy ([place]) 01/16/1989    Ms. Barkett is a 49yoF now s/p I&D of right chest wall abscess 04/17/21 ([place])  -Doing well -Afebrile since OR, WBC now down-trending -Continue IV abx today -Packing out tomorrow AM or sooner if saturates   LOS: 2 days   Nadeen Landau, MD [facility], [place]

## 2021-04-18 NOTE — Progress Notes (Signed)
Pharmacy Antibiotic Note  Shirley [name] is a 45 y.o. female admitted on 04/16/2021 presenting with draining abscess, fever.  Pharmacy has been consulted for vancomycin and zosyn dosing.  Scr up to 2.96 today   Plan: Hold Vancomycin Continue Zosyn 3.375 grams iv Q 8 hours  Follow up random vancomycin level in AM  Height: 5\' 7"  (170.2 cm) Weight: 94 kg (207 lb 3.7 oz) IBW/kg (Calculated) : 61.6  Temp (24hrs), Avg:99 F (37.2 C), Min:98.1 F (36.7 C), Max:100.2 F (37.9 C)  Recent Labs  Lab 04/16/21 1850 04/17/21 1322 04/18/21 0748  WBC 24.2* 25.2* 22.9*  CREATININE 1.62*  --  2.96*  LATICACIDVEN 1.2  --   --      Estimated Creatinine Clearance: 27.7 mL/min (A) (by C-G formula based on SCr of 2.96 mg/dL (H)).    Allergies  Allergen Reactions   Lisinopril Nausea Only and Swelling   Morphine And Related Itching and Swelling    Thank you Anette Guarneri, PharmD 04/18/2021 11:08 AM

## 2021-04-18 NOTE — Progress Notes (Signed)
Inpatient Diabetes Program Recommendations  AACE/ADA: New Consensus Statement on Inpatient Glycemic Control (2015)  Target Ranges:  Prepandial:   less than 140 mg/dL      Peak postprandial:   less than 180 mg/dL (1-2 hours)      Critically ill patients:  140 - 180 mg/dL   Lab Results  Component Value Date   GLUCAP 201 (H) 04/18/2021   HGBA1C 13.5 (H) 04/17/2021    Review of Glycemic Control  Latest Reference Range & Units 04/17/21 08:35 04/17/21 11:11 04/17/21 17:45 04/17/21 21:28 04/18/21 00:41 04/18/21 05:39 04/18/21 08:15 04/18/21 11:08  Glucose-Capillary 70 - 99 mg/dL 90 129 (H) 328 (H) 301 (H) 223 (H) 239 (H) 226 (H) 201 (H)  (H): Data is abnormally high   Diabetes history: DM2 Outpatient Diabetes medications: Mounjaro weekly, Lantus 65 units QD, Novolog 15 units TID Current orders for Inpatient glycemic control: Semglee 30 QD, Novolog 0-15 units TID and 0-5 units QHS, Novolog 5 units TID  Spoke with patient at bedside.  Reviewed patient's current A1c of 13.5% (average blood glucose of 341 mg/dl). . Explained what a A1c is and what it measures. Also reviewed goal A1c with patient, importance of good glucose control @ home, and blood sugar goals.  She states she started back on her insulin 2-3 weeks ago.  She states she started back because she wants to be healthy and have knee surgery.  She confirms above home medications.  She denies difficulty obtaining medications.  Ordered LWWD booklet.  She drinks Ginger Ale.  Encouraged her to switch to diet beverages and water.  Dicussed The Plate Method, portion control, short and long term complications of uncontrolled blood sugar and impact of physical activity on blood glucose.    Will continue to follow while inpatient.  Thank you, Reche Dixon, RN, BSN Diabetes Coordinator Inpatient Diabetes Program [phone] (team pager from 8a-5p)

## 2021-04-18 NOTE — Progress Notes (Signed)
Patient ID: LENNYX VERDELL, female   DOB: Oct 31, 1975, 45 y.o.   MRN: [id number]  PROGRESS NOTE    SEVILLE BRICK  EVO:[id number] DOB: [birth year] DOA: 04/16/2021 PCP: Fredrich Romans, PA   Brief Narrative:  45 y.o. female with medical history significant of poorly controlled insulin-dependent type 2 diabetes (A1c 12.3 a year ago), recurrent abscesses, hypertension, medical noncompliance, CKD stage IIIa, OSA with CPAP, CAD, CVA, anxiety, depression, chronic hep B, hypertension, hyperlipidemia, substance abuse presented to the ED for evaluation of abscess under her right breast with drainage.  On presentation, WBC was 24.2 with creatinine of 1.62.  CT showing edema and soft tissue stranding within the right greater than left mid to lower chest wall with evidence of a 3.8 x 2.9 cm gas and fluid collection in the subcutaneous fat consistent with soft tissue abscess.  Moderate gas tracking posteriorly along the right lateral chest wall.  Soft tissue inflammatory changes extend to the right upper quadrant abdominal wall.  She was started on IV fluids/antibiotics.  General surgery was consulted.  She underwent I&D of right chest wall abscess on 04/17/2021.  Assessment & Plan:   Sepsis: Present on admission Right chest wall abscess -Presented with tachycardia, tachypnea, leukocytosis with right chest wall abscess -Currently on IV fluids along with broad-spectrum antibiotics. -Status post I&D of right chest wall abscess on 04/17/2021 by general surgery.  Follow further general surgery recommendations.  Wound care as per general surgery.  Follow cultures  Leukocytosis -Labs pending for today.  Monitor  AKI on CKD stage IIIa -Creatinine 1.62; baseline creatinine around 1.2.  Continue IV fluids.  Creatinine pending for today. -Losartan and hydrochlorothiazide on hold.  Left adnexal lesion -CT showing a 3.8 cm low density indeterminate left adnexal lesion. -Will need nonemergent outpatient pelvic  ultrasound for further evaluation.   Bacterial conjunctivitis -Continue erythromycin ointment   Poorly controlled insulin-dependent type 2 diabetes -A1c 12.3 a year ago and history of medical noncompliance. -Repeat A1c is 13.5.   -Resume long-acting insulin along with with CBGs with SSI.    Hypertension -Blood pressure improved. -Continue amlodipine   Diabetic neuropathy -Continue gabapentin   OSA -Continue nightly CPAP   CAD -Not endorsing any anginal symptoms.  DVT prophylaxis: SCDs Code Status: Full Family Communication: Husband at bedside Disposition Plan: Status is: Inpatient  Remains inpatient appropriate because: Of severity of illness; requiring IV fluids and antibiotics   Consultants: General surgery  Procedures: I&D of right chest wall abscess on 04/17/2021  Antimicrobials:  Anti-infectives (From admission, onward)    Start     Dose/Rate Route Frequency Ordered Stop   04/18/21 1000  Vancomycin (VANCOCIN) 1,250 mg in sodium chloride 0.9 % 250 mL IVPB  Status:  Discontinued        1,250 mg 166.7 mL/hr over 90 Minutes Intravenous Every 36 hours 04/16/21 2101 04/18/21 0127   04/18/21 1000  vancomycin (VANCOREADY) IVPB 1250 mg/250 mL        1,250 mg 166.7 mL/hr over 90 Minutes Intravenous Every 36 hours 04/18/21 0127     04/17/21 0600  piperacillin-tazobactam (ZOSYN) IVPB 3.375 g        3.375 g 12.5 mL/hr over 240 Minutes Intravenous Every 8 hours 04/16/21 2059     04/16/21 2000  vancomycin (VANCOCIN) 1,750 mg in sodium chloride 0.9 % 500 mL IVPB        1,750 mg 250 mL/hr over 120 Minutes Intravenous  Once 04/16/21 1947 04/17/21 0038   04/16/21 2000  piperacillin-tazobactam (ZOSYN)  IVPB 3.375 g        3.375 g 100 mL/hr over 30 Minutes Intravenous  Once 04/16/21 1947 04/16/21 2059        Subjective: Patient seen and examined at bedside.  Complains of some chest wall pain.  No overnight fever, vomiting reported.  Objective: Vitals:   04/17/21 1110  04/17/21 1400 04/17/21 1539 04/17/21 1942  BP: 134/80  124/77 117/70  Pulse: (!) 124  93 95  Resp: 16 12 18 18   Temp: 100.2 F (37.9 C)  98.2 F (36.8 C) 98.1 F (36.7 C)  TempSrc: Oral  Oral Oral  SpO2: 100%  97% 100%  Weight:      Height:        Intake/Output Summary (Last 24 hours) at 04/18/2021 0806 Last data filed at 04/17/2021 1800 Gross per 24 hour  Intake 600 ml  Output 405 ml  Net 195 ml   Filed Weights   04/16/21 1847  Weight: 94 kg    Examination:  General exam: Appears calm and comfortable.  Currently on room air. Respiratory system: Bilateral decreased breath sounds at bases with some scattered crackles Cardiovascular system: S1 & S2 heard, Rate controlled Gastrointestinal system: Abdomen is nondistended, soft and nontender. Normal bowel sounds heard. Extremities: No cyanosis, clubbing, edema  Central nervous system: Alert and oriented. No focal neurological deficits. Moving extremities Skin: No obvious ecchymosis/rashes  psychiatry: Affect is mostly flat   Data Reviewed: I have personally reviewed following labs and imaging studies  CBC: Recent Labs  Lab 04/16/21 1850 04/17/21 1322  WBC 24.2* 25.2*  NEUTROABS 18.6*  --   HGB 11.4* 10.1*  HCT 32.4* 27.9*  MCV 78.6* 77.1*  PLT 404* 160   Basic Metabolic Panel: Recent Labs  Lab 04/16/21 1850  NA 133*  K 3.9  CL 104  CO2 20*  GLUCOSE 87  BUN 16  CREATININE 1.62*  CALCIUM 8.4*   GFR: Estimated Creatinine Clearance: 50.6 mL/min (A) (by C-G formula based on SCr of 1.62 mg/dL (H)). Liver Function Tests: Recent Labs  Lab 04/16/21 1850  AST 28  ALT 10  ALKPHOS 124  BILITOT 0.7  PROT 6.3*  ALBUMIN 1.8*   No results for input(s): LIPASE, AMYLASE in the last 168 hours. No results for input(s): AMMONIA in the last 168 hours. Coagulation Profile: Recent Labs  Lab 04/16/21 1850  INR 1.1   Cardiac Enzymes: No results for input(s): CKTOTAL, CKMB, CKMBINDEX, TROPONINI in the last 168  hours. BNP (last 3 results) No results for input(s): PROBNP in the last 8760 hours. HbA1C: Recent Labs    04/17/21 1322  HGBA1C 13.5*   CBG: Recent Labs  Lab 04/17/21 1111 04/17/21 1745 04/17/21 2128 04/18/21 0041 04/18/21 0539  GLUCAP 129* 328* 301* 223* 239*   Lipid Profile: No results for input(s): CHOL, HDL, LDLCALC, TRIG, CHOLHDL, LDLDIRECT in the last 72 hours. Thyroid Function Tests: No results for input(s): TSH, T4TOTAL, FREET4, T3FREE, THYROIDAB in the last 72 hours. Anemia Panel: No results for input(s): VITAMINB12, FOLATE, FERRITIN, TIBC, IRON, RETICCTPCT in the last 72 hours. Sepsis Labs: Recent Labs  Lab 04/16/21 1850  LATICACIDVEN 1.2    Recent Results (from the past 240 hour(s))  Blood Culture (routine x 2)     Status: None (Preliminary result)   Collection Time: 04/16/21  7:05 PM   Specimen: BLOOD  Result Value Ref Range Status   Specimen Description BLOOD LEFT ANTECUBITAL  Final   Special Requests   Final  BOTTLES DRAWN AEROBIC AND ANAEROBIC Blood Culture results may not be optimal due to an inadequate volume of blood received in culture bottles   Culture   Final    NO GROWTH 2 DAYS Performed at [facility], [place] [address], [place], [place] [address]    Report Status PENDING  Incomplete  Blood Culture (routine x 2)     Status: None (Preliminary result)   Collection Time: 04/16/21  7:05 PM   Specimen: BLOOD  Result Value Ref Range Status   Specimen Description BLOOD SITE NOT SPECIFIED  Final   Special Requests   Final    BOTTLES DRAWN AEROBIC AND ANAEROBIC Blood Culture results may not be optimal due to an inadequate volume of blood received in culture bottles   Culture   Final    NO GROWTH 2 DAYS Performed at [facility], [place] [address] [place], [place] [address]    Report Status PENDING  Incomplete  Resp Panel by RT-PCR (Flu A&B, Covid) Nasopharyngeal Swab     Status: None   Collection Time: 04/16/21  7:35 PM   Specimen:  Nasopharyngeal Swab; Nasopharyngeal(NP) swabs in vial transport medium  Result Value Ref Range Status   SARS Coronavirus 2 by RT PCR NEGATIVE NEGATIVE Final    Comment: (NOTE) SARS-CoV-2 target nucleic acids are NOT DETECTED.  The SARS-CoV-2 RNA is generally detectable in upper respiratory specimens during the acute phase of infection. The lowest concentration of SARS-CoV-2 viral copies this assay can detect is 138 copies/mL. A negative result does not preclude SARS-Cov-2 infection and should not be used as the sole basis for treatment or other patient management decisions. A negative result may occur with  improper specimen collection/handling, submission of specimen other than nasopharyngeal swab, presence of viral mutation(s) within the areas targeted by this assay, and inadequate number of viral copies(<138 copies/mL). A negative result must be combined with clinical observations, patient history, and epidemiological information. The expected result is Negative.  Fact Sheet for Patients:  EntrepreneurPulse.com.au  Fact Sheet for Healthcare Providers:  IncredibleEmployment.be  This test is no t yet approved or cleared by the [place] FDA and  has been authorized for detection and/or diagnosis of SARS-CoV-2 by FDA under an Emergency Use Authorization (EUA). This EUA will remain  in effect (meaning this test can be used) for the duration of the COVID-19 declaration under Section 564(b)(1) of the Act, 21 U.S.C.section 360bbb-3(b)(1), unless the authorization is terminated  or revoked sooner.       Influenza A by PCR NEGATIVE NEGATIVE Final   Influenza B by PCR NEGATIVE NEGATIVE Final    Comment: (NOTE) The Xpert Xpress SARS-CoV-2/FLU/RSV plus assay is intended as an aid in the diagnosis of influenza from Nasopharyngeal swab specimens and should not be used as a sole basis for treatment. Nasal washings and aspirates are unacceptable for  Xpert Xpress SARS-CoV-2/FLU/RSV testing.  Fact Sheet for Patients: EntrepreneurPulse.com.au  Fact Sheet for Healthcare Providers: IncredibleEmployment.be  This test is not yet approved or cleared by the [place] FDA and has been authorized for detection and/or diagnosis of SARS-CoV-2 by FDA under an Emergency Use Authorization (EUA). This EUA will remain in effect (meaning this test can be used) for the duration of the COVID-19 declaration under Section 564(b)(1) of the Act, 21 U.S.C. section 360bbb-3(b)(1), unless the authorization is terminated or revoked.  Performed at [facility], [place] [address] [place], [place] [address]   Aerobic/Anaerobic Culture w Gram Stain (surgical/deep  wound)     Status: None (Preliminary result)   Collection Time: 04/17/21  8:43 AM   Specimen: Abscess  Result Value Ref Range Status   Specimen Description ABSCESS  Final   Special Requests NONE  Final   Gram Stain   Final    ABUNDANT WBC PRESENT, PREDOMINANTLY PMN FEW GRAM POSITIVE COCCI IN PAIRS IN CLUSTERS RARE GRAM POSITIVE RODS FEW GRAM NEGATIVE RODS Performed at [facility], [place] [address], [place], [place] [address]    Culture PENDING  Incomplete   Report Status PENDING  Incomplete  Aerobic/Anaerobic Culture w Gram Stain (surgical/deep wound)     Status: None (Preliminary result)   Collection Time: 04/17/21  8:47 AM   Specimen: Other Source; Body Fluid  Result Value Ref Range Status   Specimen Description FLUID  Final   Special Requests RIGHT CHEST WALL ABS SPEC B  Final   Gram Stain   Final    ABUNDANT WBC PRESENT, PREDOMINANTLY PMN MODERATE GRAM POSITIVE COCCI IN PAIRS Performed at [facility], [address] [place],  [address]    Culture PENDING  Incomplete   Report Status PENDING  Incomplete         Radiology Studies: CT CHEST ABDOMEN PELVIS W CONTRAST  Result Date: 04/16/2021 CLINICAL DATA:  Sepsis  pain beneath the right breast EXAM: CT CHEST, ABDOMEN, AND PELVIS WITH CONTRAST TECHNIQUE: Multidetector CT imaging of the chest, abdomen and pelvis was performed following the standard protocol during bolus administration of intravenous contrast. CONTRAST:  173mL OMNIPAQUE IOHEXOL 300 MG/ML  SOLN COMPARISON:  CT abdomen pelvis 09/21/2017, chest x-ray 04/16/2021, CT chest 11/07/2016 FINDINGS: CT CHEST FINDINGS Cardiovascular: Nonaneurysmal aorta. Normal cardiac size. No pericardial effusion. Mediastinum/Nodes: Midline trachea. No thyroid mass. No suspicious mediastinal lymph nodes. Enlarged right axillary lymph nodes measuring up to 15 mm. Esophagus within normal limits. Lungs/Pleura: Left upper lobe bronchiectasis with associated stellate density at left apex, no significant change since 2018 and presumably scarring. Other areas of scarring are visualized in the left upper lobe/lingula. 23 posterior pleural thickening with possible mild calcification on the left suggestive of pleural plaques. Musculoskeletal: Sternum is intact. Thoracic vertebral bodies demonstrate normal stature. Subcutaneous edema within the right greater than left chest wall. Gas and fluid collection within the right anterolateral chest wall, inferior to the right breast, this measures approximately 3.8 by 2.9 cm and would be consistent with soft tissue abscess. Moderate gas tracking within the right lateral chest wall soft tissues posteriorly. Edema and soft tissue stranding extend to the right upper quadrant abdominal wall subcutaneous fat. CT ABDOMEN PELVIS FINDINGS Hepatobiliary: No focal liver abnormality is seen. No gallstones, gallbladder wall thickening, or biliary dilatation. Pancreas: Unremarkable. No pancreatic ductal dilatation or surrounding inflammatory changes. Spleen: Normal in size without focal abnormality. Adrenals/Urinary Tract: Adrenal glands are unremarkable. Kidneys are normal, without renal calculi, focal lesion, or  hydronephrosis. Bladder is unremarkable. Stomach/Bowel: Stomach nonenlarged. No dilated small bowel. No acute bowel wall thickening. Negative appendix. Vascular/Lymphatic: Mild atherosclerosis. No aneurysm. No suspicious nodes Reproductive: Lobulated uterus with probable fibroids. Uterus is deviated to the right pelvis. 3.8 by 2 cm low-density left adnexal lesion, not entirely consistent with simple cyst. Other: Negative for pelvic effusion or free air. Musculoskeletal: No acute or suspicious osseous abnormality. Advanced degenerative changes at L5-S1. IMPRESSION: 1. Edema and soft tissue stranding within the right greater than left mid to lower chest wall with evidence for a 3.8 x 2.9 cm gas and fluid collection  in the subcutaneous fat consistent with soft tissue abscess. Moderate gas tracking posteriorly along the right lateral chest wall. Soft tissue inflammatory changes extend to the right upper quadrant anterior abdominal wall. 2. Mild right axillary adenopathy, likely reactive 3. No CT evidence for acute intra-abdominal or pelvic abnormality. Fibroid uterus. 4. 3.8 cm low-density indeterminate left adnexal lesion. Suggest correlation with non emergent pelvic ultrasound. Electronically Signed   By: Donavan Foil M.D.   On: 04/16/2021 23:04   DG Chest Port 1 View  Result Date: 04/16/2021 CLINICAL DATA:  Questionable sepsis - evaluate for abnormality EXAM: PORTABLE CHEST 1 VIEW COMPARISON:  February 09, 2020 FINDINGS: The cardiomediastinal silhouette is unchanged in contour. No pleural effusion. No pneumothorax. No acute pleuroparenchymal abnormality. Visualized abdomen is unremarkable. IMPRESSION: No acute cardiopulmonary abnormality. Electronically Signed   By: Valentino Saxon M.D.   On: 04/16/2021 19:26        Scheduled Meds:  amLODipine  10 mg Oral Daily   erythromycin  1 application Both Eyes G1W   gabapentin  400 mg Oral TID   insulin aspart  0-9 Units Subcutaneous Q4H   Continuous  Infusions:  sodium chloride 125 mL/hr at 04/18/21 0321   piperacillin-tazobactam (ZOSYN)  IV 3.375 g (04/18/21 0549)   vancomycin            Aline August, MD [facility] 04/18/2021, 8:06 AM

## 2021-04-18 NOTE — Progress Notes (Signed)
Pt does not want to wear a CPAP tonight will call if needed.

## 2021-04-18 NOTE — Progress Notes (Signed)
°  Transition of Care (TOC) Screening Note   Patient Details  Name: Shirley [name] Date of Birth: [birth year]   Transition of Care [facility]) CM/SW Contact:    Geralynn Ochs, LCSW Phone Number: 04/18/2021, 9:28 AM    Transition of Care Department [facility]) has reviewed patient and no TOC needs have been identified at this time. We will continue to monitor patient advancement through interdisciplinary progression rounds. If new patient transition needs arise, please place a TOC consult.

## 2021-04-19 ENCOUNTER — Inpatient Hospital Stay (HOSPITAL_COMMUNITY): Payer: Medicare Other

## 2021-04-19 DIAGNOSIS — E1142 Type 2 diabetes mellitus with diabetic polyneuropathy: Secondary | ICD-10-CM

## 2021-04-19 DIAGNOSIS — N179 Acute kidney failure, unspecified: Secondary | ICD-10-CM

## 2021-04-19 DIAGNOSIS — E1165 Type 2 diabetes mellitus with hyperglycemia: Secondary | ICD-10-CM

## 2021-04-19 LAB — GLUCOSE, CAPILLARY
Glucose-Capillary: 122 mg/dL — ABNORMAL HIGH (ref 70–99)
Glucose-Capillary: 124 mg/dL — ABNORMAL HIGH (ref 70–99)
Glucose-Capillary: 144 mg/dL — ABNORMAL HIGH (ref 70–99)
Glucose-Capillary: 145 mg/dL — ABNORMAL HIGH (ref 70–99)
Glucose-Capillary: 183 mg/dL — ABNORMAL HIGH (ref 70–99)
Glucose-Capillary: 187 mg/dL — ABNORMAL HIGH (ref 70–99)

## 2021-04-19 LAB — BASIC METABOLIC PANEL
Anion gap: 11 (ref 5–15)
BUN: 29 mg/dL — ABNORMAL HIGH (ref 6–20)
CO2: 21 mmol/L — ABNORMAL LOW (ref 22–32)
Calcium: 8 mg/dL — ABNORMAL LOW (ref 8.9–10.3)
Chloride: 102 mmol/L (ref 98–111)
Creatinine, Ser: 3.8 mg/dL — ABNORMAL HIGH (ref 0.44–1.00)
GFR, Estimated: 14 mL/min — ABNORMAL LOW (ref >60)
Glucose, Bld: 135 mg/dL — ABNORMAL HIGH (ref 70–99)
Potassium: 4 mmol/L (ref 3.5–5.1)
Sodium: 134 mmol/L — ABNORMAL LOW (ref 135–145)

## 2021-04-19 LAB — CBC WITH DIFFERENTIAL/PLATELET
Abs Immature Granulocytes: 0.18 10*3/uL — ABNORMAL HIGH (ref 0.00–0.07)
Basophils Absolute: 0.1 10*3/uL (ref 0.0–0.1)
Basophils Relative: 0 %
Eosinophils Absolute: 0.6 10*3/uL — ABNORMAL HIGH (ref 0.0–0.5)
Eosinophils Relative: 3 %
HCT: 29.6 % — ABNORMAL LOW (ref 36.0–46.0)
Hemoglobin: 10.1 g/dL — ABNORMAL LOW (ref 12.0–15.0)
Immature Granulocytes: 1 %
Lymphocytes Relative: 18 %
Lymphs Abs: 3.6 10*3/uL (ref 0.7–4.0)
MCH: 26.6 pg (ref 26.0–34.0)
MCHC: 34.1 g/dL (ref 30.0–36.0)
MCV: 77.9 fL — ABNORMAL LOW (ref 80.0–100.0)
Monocytes Absolute: 2.1 10*3/uL — ABNORMAL HIGH (ref 0.1–1.0)
Monocytes Relative: 10 %
Neutro Abs: 13.9 10*3/uL — ABNORMAL HIGH (ref 1.7–7.7)
Neutrophils Relative %: 68 %
Platelets: 437 10*3/uL — ABNORMAL HIGH (ref 150–400)
RBC: 3.8 MIL/uL — ABNORMAL LOW (ref 3.87–5.11)
RDW: 14.6 % (ref 11.5–15.5)
WBC: 20.5 10*3/uL — ABNORMAL HIGH (ref 4.0–10.5)
nRBC: 0 % (ref 0.0–0.2)

## 2021-04-19 LAB — C-REACTIVE PROTEIN: CRP: 16.2 mg/dL — ABNORMAL HIGH (ref <1.0)

## 2021-04-19 LAB — MAGNESIUM: Magnesium: 1.8 mg/dL (ref 1.7–2.4)

## 2021-04-19 LAB — VANCOMYCIN, RANDOM: Vancomycin Rm: 11

## 2021-04-19 MED ORDER — PIPERACILLIN-TAZOBACTAM IN DEX 2-0.25 GM/50ML IV SOLN
2.2500 g | Freq: Three times a day (TID) | INTRAVENOUS | Status: DC
  Administered 2021-04-19 – 2021-04-21 (×5): 2.25 g via INTRAVENOUS
  Filled 2021-04-19 (×8): qty 50

## 2021-04-19 MED ORDER — HYDROMORPHONE HCL 1 MG/ML IJ SOLN
0.5000 mg | INTRAMUSCULAR | Status: DC | PRN
Start: 2021-04-19 — End: 2021-04-26
  Administered 2021-04-20 – 2021-04-23 (×5): 1 mg via INTRAVENOUS
  Administered 2021-04-24: 0.5 mg via INTRAVENOUS
  Administered 2021-04-24 – 2021-04-25 (×3): 1 mg via INTRAVENOUS
  Filled 2021-04-19 (×9): qty 1

## 2021-04-19 MED ORDER — ACETAMINOPHEN 500 MG PO TABS: 1000.0000 mg | ORAL_TABLET | Freq: Four times a day (QID) | ORAL | Status: DC

## 2021-04-19 MED ORDER — POLYETHYLENE GLYCOL 3350 17 G PO PACK
17.0000 g | PACK | Freq: Every day | ORAL | Status: DC
  Administered 2021-04-21 – 2021-04-22 (×2): 17 g via ORAL
  Filled 2021-04-19 (×7): qty 1

## 2021-04-19 MED ORDER — METHOCARBAMOL 500 MG PO TABS
500.0000 mg | ORAL_TABLET | Freq: Four times a day (QID) | ORAL | Status: DC | PRN
  Administered 2021-04-21 – 2021-04-23 (×3): 500 mg via ORAL
  Filled 2021-04-19 (×3): qty 1

## 2021-04-19 MED ORDER — ACETAMINOPHEN 500 MG PO TABS
1000.0000 mg | ORAL_TABLET | Freq: Four times a day (QID) | ORAL | Status: DC
  Administered 2021-04-19 – 2021-04-26 (×21): 1000 mg via ORAL
  Filled 2021-04-19 (×24): qty 2

## 2021-04-19 MED ORDER — LORAZEPAM 2 MG/ML IJ SOLN: 0.5000 mg | Freq: Once | INTRAMUSCULAR | Status: DC | PRN

## 2021-04-19 MED ORDER — GABAPENTIN 400 MG PO CAPS
400.0000 mg | ORAL_CAPSULE | Freq: Every day | ORAL | Status: DC
  Administered 2021-04-20 – 2021-04-26 (×7): 400 mg via ORAL
  Filled 2021-04-19 (×7): qty 1

## 2021-04-19 MED ORDER — DOCUSATE SODIUM 100 MG PO CAPS: 100.0000 mg | ORAL_CAPSULE | Freq: Two times a day (BID) | ORAL | Status: DC | PRN

## 2021-04-19 MED ORDER — OXYCODONE HCL 5 MG PO TABS
5.0000 mg | ORAL_TABLET | ORAL | Status: DC | PRN
Start: 2021-04-19 — End: 2021-04-26
  Administered 2021-04-19 – 2021-04-22 (×11): 10 mg via ORAL
  Administered 2021-04-23: 5 mg via ORAL
  Administered 2021-04-23 – 2021-04-25 (×3): 10 mg via ORAL
  Administered 2021-04-25: 5 mg via ORAL
  Administered 2021-04-25 – 2021-04-26 (×3): 10 mg via ORAL
  Filled 2021-04-19 (×8): qty 2
  Filled 2021-04-19: qty 1
  Filled 2021-04-19 (×9): qty 2
  Filled 2021-04-19: qty 1
  Filled 2021-04-19 (×2): qty 2

## 2021-04-19 MED ORDER — DOXYCYCLINE HYCLATE 100 MG IV SOLR
100.0000 mg | Freq: Two times a day (BID) | INTRAVENOUS | Status: DC
  Administered 2021-04-19 – 2021-04-22 (×7): 100 mg via INTRAVENOUS
  Filled 2021-04-19 (×8): qty 100

## 2021-04-19 NOTE — Progress Notes (Signed)
2 Days Post-Op  Subjective: CC: Patient sleepy this morning but easily arousable to voice and answers questions appropriately. A&O x 4. Husband reports that she received pain medication about an hour ago and has been sleepy since that time. Patient reports pain at the area of her incision. Dressing saturated threw gown. It has not been changed since surgery. T max 100.4. WBC still elevated but downtrending slightly to 20.5  She reports she is tolerating a diet without n/v or abdominal pain. No BM. Voiding. Mobilized in the room to the restroom yesterday.   Objective: Vital signs in last 24 hours: Temp:  [98.9 F (37.2 C)-100.4 F (38 C)] 100.4 F (38 C) (12/27 0819) Pulse Rate:  [105-115] 105 (12/27 0819) Resp:  [18-20] 18 (12/27 0819) BP: (128-157)/(78-81) 128/81 (12/27 0819) SpO2:  [100 %] 100 % (12/27 0819) Last BM Date: 04/17/21  Intake/Output from previous day: No intake/output data recorded. Intake/Output this shift: No intake/output data recorded.  PE: Gen:  Alert, NAD, pleasant Card:  RRR Pulm: Normal rate and effort  Abd: Soft, ND, NT  Wound: Chaperone present (PA &RN). Right chest wall incision present measuring ~4x2cm. Betadine soaked packing removed with some purulent drainage present on dressing. There is induration measuring 5cm superior medially, 4cm superiorly and 5cm superior laterally from incision. Minimal induration inferiorly. Wound base with some mixed granulation and fibropurulent tissue. There is tunneling tracking 5cm superior medially, 4cm superiorly and 5cm superior laterally coinciding with above induration. Minimal tunneling inferiorly. No drainage from wound.       Lab Results:  Recent Labs    04/18/21 0748 04/19/21 0248  WBC 22.9* 20.5*  HGB 10.6* 10.1*  HCT 31.3* 29.6*  PLT 441* 437*   BMET Recent Labs    04/18/21 0748 04/19/21 0248  NA 133* 134*  K 4.4 4.0  CL 101 102  CO2 20* 21*  GLUCOSE 252* 135*  BUN 26* 29*   CREATININE 2.96* 3.80*  CALCIUM 8.1* 8.0*   PT/INR Recent Labs    04/16/21 1850  LABPROT 14.5  INR 1.1   CMP     Component Value Date/Time   NA 134 (L) 04/19/2021 0248   NA 137 02/07/2017 1528   K 4.0 04/19/2021 0248   CL 102 04/19/2021 0248   CO2 21 (L) 04/19/2021 0248   GLUCOSE 135 (H) 04/19/2021 0248   BUN 29 (H) 04/19/2021 0248   BUN 11 02/07/2017 1528   CREATININE 3.80 (H) 04/19/2021 0248   CREATININE 0.76 07/15/2012 1419   CALCIUM 8.0 (L) 04/19/2021 0248   PROT 6.3 (L) 04/16/2021 1850   PROT 7.1 02/07/2017 1528   ALBUMIN 1.8 (L) 04/16/2021 1850   ALBUMIN 3.7 02/07/2017 1528   AST 28 04/16/2021 1850   ALT 10 04/16/2021 1850   ALKPHOS 124 04/16/2021 1850   BILITOT 0.7 04/16/2021 1850   BILITOT 0.3 02/07/2017 1528   GFRNONAA 14 (L) 04/19/2021 0248   GFRAA 40 (L) 01/03/2020 2017   Lipase     Component Value Date/Time   LIPASE 24 02/09/2020 1439    Studies/Results: No results found.  Anti-infectives: Anti-infectives (From admission, onward)    Start     Dose/Rate Route Frequency Ordered Stop   04/19/21 0815  doxycycline (VIBRAMYCIN) 100 mg in sodium chloride 0.9 % 250 mL IVPB        100 mg 125 mL/hr over 120 Minutes Intravenous Every 12 hours 04/19/21 0729     04/18/21 1000  Vancomycin (VANCOCIN) 1,250 mg in  sodium chloride 0.9 % 250 mL IVPB  Status:  Discontinued        1,250 mg 166.7 mL/hr over 90 Minutes Intravenous Every 36 hours 04/16/21 2101 04/18/21 0127   04/18/21 1000  vancomycin (VANCOREADY) IVPB 1250 mg/250 mL  Status:  Discontinued        1,250 mg 166.7 mL/hr over 90 Minutes Intravenous Every 36 hours 04/18/21 0127 04/18/21 1103   04/17/21 0600  piperacillin-tazobactam (ZOSYN) IVPB 3.375 g        3.375 g 12.5 mL/hr over 240 Minutes Intravenous Every 8 hours 04/16/21 2059     04/16/21 2000  vancomycin (VANCOCIN) 1,750 mg in sodium chloride 0.9 % 500 mL IVPB        1,750 mg 250 mL/hr over 120 Minutes Intravenous  Once 04/16/21 1947 04/17/21  0038   04/16/21 2000  piperacillin-tazobactam (ZOSYN) IVPB 3.375 g        3.375 g 100 mL/hr over 30 Minutes Intravenous  Once 04/16/21 1947 04/16/21 2059        Assessment/Plan POD 2 s/p I&D of R chest wall abscess by Dr. Bobbye Morton - 04/17/2021 - T max 100.4. WBC persistently elevated but downtrending slightly (25 > 22 > 20.5) - Cx's pending. Gram stain with gram + cocci in pairs and clusters, gram + rods and gram - rods  - Cont broad spectrum abx - Start BID WTD - Adjust pain medications. Maximize non-narcotics.  - No current indication for further debridement. We will follow with you   FEN - HH/CM, IVF per [facility] VTE - SCDs,  ID - Vanc 12/24 - 12/26. Doxy 12/27 >> Zosyn 12/24 >>   - Per [facility] -  AKI  DM2 HTN HLD OSA CAD Hx CVA   LOS: 3 days    Jillyn Ledger , [facility] 04/19/2021, 10:08 AM Please see Amion for pager number during day hours 7:00am-4:30pm

## 2021-04-19 NOTE — Progress Notes (Signed)
Patient refusing CPAP for the night.  RT available if patient changes their mind.

## 2021-04-19 NOTE — Progress Notes (Signed)
PHARMACY NOTE:  ANTIMICROBIAL RENAL DOSAGE ADJUSTMENT  Current antimicrobial regimen includes a mismatch between antimicrobial dosage and estimated renal function.  As per policy approved by the Pharmacy & Therapeutics and Medical Executive Committees, the antimicrobial dosage will be adjusted accordingly.  Current antimicrobial dosage:  Zosyn 3.375g IV q8h - 4hr infusion  Indication: Right chest wall abscess   Renal Function: Minimal urine output per discussion with RN (patient currently has purewick in place and it is dry).   Estimated Creatinine Clearance: 21.6 mL/min (A) (by C-G formula based on SCr of 3.8 mg/dL (H)). []      On intermittent HD, scheduled: []      On CRRT    Antimicrobial dosage has been changed to:  Zosyn 2.25g IV every 8 hours.  Additional comments: Will continue to monitor renal function and adjust as needed.   Thank you for allowing pharmacy to be a part of this patient's care.  Brain Hilts, [facility] 04/19/2021 6:33 PM

## 2021-04-19 NOTE — Progress Notes (Signed)
Patient ID: Shirley [name], female   DOB: 06-Sep-1975, 45 y.o.   MRN: [id number]  PROGRESS NOTE    Shirley [name]  ZHY:[id number] DOB: 02-10-1976 DOA: 04/16/2021 PCP: Fredrich Romans, PA   Brief Narrative:  45 y.o. female with medical history significant of poorly controlled insulin-dependent type 2 diabetes (A1c 12.3 a year ago), recurrent abscesses, hypertension, medical noncompliance, CKD stage IIIa, OSA with CPAP, CAD, CVA, anxiety, depression, chronic hep B, hypertension, hyperlipidemia, substance abuse presented to the ED for evaluation of abscess under her right breast with drainage.  On presentation, WBC was 24.2 with creatinine of 1.62.  CT showing edema and soft tissue stranding within the right greater than left mid to lower chest wall with evidence of a 3.8 x 2.9 cm gas and fluid collection in the subcutaneous fat consistent with soft tissue abscess.  Moderate gas tracking posteriorly along the right lateral chest wall.  Soft tissue inflammatory changes extend to the right upper quadrant abdominal wall.  She was started on IV fluids/antibiotics.  General surgery was consulted.  She underwent I&D of right chest wall abscess on 04/17/2021.  Assessment & Plan:   Sepsis: Present on admission Right chest wall abscess -Presented with tachycardia, tachypnea, leukocytosis with right chest wall abscess -Currently on IV fluids along with broad-spectrum antibiotics.  DC vancomycin; start doxycycline.  Continue Zosyn. -Status post I&D of right chest wall abscess on 04/17/2021 by general surgery.  Follow further general surgery recommendations.  Wound care as per general surgery.  OR cultures pending for now: Gram stain showing gram-positive cocci in pairs and clusters; gram-positive rods; gram-negative rods. -CRP 16.2 today.  Leukocytosis -WBCs still elevated today at 20.5.  Monitor  AKI on CKD stage IIIa -baseline creatinine around 1.2.  Creatinine 3.80 today.  Check renal ultrasound.  DC  vancomycin.  Repeat a.m. labs.  Continue IV fluids. -Losartan and hydrochlorothiazide on hold.  Hyponatremia -Mild.  Monitor  Left adnexal lesion -CT showing a 3.8 cm low density indeterminate left adnexal lesion. -Will need nonemergent outpatient pelvic ultrasound for further evaluation.   Bacterial conjunctivitis -Continue erythromycin ointment   Poorly controlled insulin-dependent type 2 diabetes -A1c 12.3 a year ago and history of medical noncompliance. -Repeat A1c is 13.5.   -Continue long-acting insulin along with CBGs with SSI  Hypertension -Blood pressure improved. -Continue amlodipine   Diabetic neuropathy -Continue gabapentin   OSA -Continue nightly CPAP   CAD -Not endorsing any anginal symptoms.  DVT prophylaxis: SCDs Code Status: Full Family Communication: Husband at bedside on 04/19/2021 Disposition Plan: Status is: Inpatient  Remains inpatient appropriate because: Of severity of illness; requiring IV fluids and antibiotics   Consultants: General surgery  Procedures: I&D of right chest wall abscess on 04/17/2021  Antimicrobials:  Anti-infectives (From admission, onward)    Start     Dose/Rate Route Frequency Ordered Stop   04/18/21 1000  Vancomycin (VANCOCIN) 1,250 mg in sodium chloride 0.9 % 250 mL IVPB  Status:  Discontinued        1,250 mg 166.7 mL/hr over 90 Minutes Intravenous Every 36 hours 04/16/21 2101 04/18/21 0127   04/18/21 1000  vancomycin (VANCOREADY) IVPB 1250 mg/250 mL  Status:  Discontinued        1,250 mg 166.7 mL/hr over 90 Minutes Intravenous Every 36 hours 04/18/21 0127 04/18/21 1103   04/17/21 0600  piperacillin-tazobactam (ZOSYN) IVPB 3.375 g        3.375 g 12.5 mL/hr over 240 Minutes Intravenous Every 8 hours 04/16/21 2059  04/16/21 2000  vancomycin (VANCOCIN) 1,750 mg in sodium chloride 0.9 % 500 mL IVPB        1,750 mg 250 mL/hr over 120 Minutes Intravenous  Once 04/16/21 1947 04/17/21 0038   04/16/21 2000   piperacillin-tazobactam (ZOSYN) IVPB 3.375 g        3.375 g 100 mL/hr over 30 Minutes Intravenous  Once 04/16/21 1947 04/16/21 2059        Subjective: Patient seen and examined at bedside.  No vomiting, abdominal pain, fever, worsening shortness of breath reported.  Still complains of intermittent chest wall pain. Objective: Vitals:   04/18/21 0800 04/18/21 0816 04/18/21 1600 04/18/21 2202  BP: (!) 150/76 (!) 150/76 (!) 155/78 (!) 157/80  Pulse: (!) 116 (!) 117 (!) 115 (!) 110  Resp: 18  19 20   Temp: 99.2 F (37.3 C) 99.2 F (37.3 C) 99.1 F (37.3 C) 98.9 F (37.2 C)  TempSrc: Oral Oral Oral Oral  SpO2: 100% 100% 100% 100%  Weight:      Height:       No intake or output data in the 24 hours ending 04/19/21 0725  Filed Weights   04/16/21 1847  Weight: 94 kg    Examination:  General exam: On room air currently.  No acute distress. Respiratory system: Decreased breath sounds at bases bilaterally with some crackles  cardiovascular system: Intermittently tachycardic; S1-S2 heard gastrointestinal system: Abdomen is obese, distended slightly; soft and nontender.  Bowel sounds are heard  extremities: No edema or clubbing  Central nervous system: Awake and alert.  No focal neurological deficits.  Moves extremities Skin: No obvious petechiae/obvious lesions  psychiatry: Flat affect  Data Reviewed: I have personally reviewed following labs and imaging studies  CBC: Recent Labs  Lab 04/16/21 1850 04/17/21 1322 04/18/21 0748 04/19/21 0248  WBC 24.2* 25.2* 22.9* 20.5*  NEUTROABS 18.6*  --  17.4* 13.9*  HGB 11.4* 10.1* 10.6* 10.1*  HCT 32.4* 27.9* 31.3* 29.6*  MCV 78.6* 77.1* 78.8* 77.9*  PLT 404* 382 441* 437*    Basic Metabolic Panel: Recent Labs  Lab 04/16/21 1850 04/18/21 0748 04/19/21 0248  NA 133* 133* 134*  K 3.9 4.4 4.0  CL 104 101 102  CO2 20* 20* 21*  GLUCOSE 87 252* 135*  BUN 16 26* 29*  CREATININE 1.62* 2.96* 3.80*  CALCIUM 8.4* 8.1* 8.0*  MG  --   1.9 1.8    GFR: Estimated Creatinine Clearance: 21.6 mL/min (A) (by C-G formula based on SCr of 3.8 mg/dL (H)). Liver Function Tests: Recent Labs  Lab 04/16/21 1850  AST 28  ALT 10  ALKPHOS 124  BILITOT 0.7  PROT 6.3*  ALBUMIN 1.8*    No results for input(s): LIPASE, AMYLASE in the last 168 hours. No results for input(s): AMMONIA in the last 168 hours. Coagulation Profile: Recent Labs  Lab 04/16/21 1850  INR 1.1    Cardiac Enzymes: No results for input(s): CKTOTAL, CKMB, CKMBINDEX, TROPONINI in the last 168 hours. BNP (last 3 results) No results for input(s): PROBNP in the last 8760 hours. HbA1C: Recent Labs    04/17/21 1322  HGBA1C 13.5*    CBG: Recent Labs  Lab 04/18/21 1108 04/18/21 1733 04/18/21 1943 04/18/21 2326 04/19/21 0329  GLUCAP 201* 314* 224* 90 145*    Lipid Profile: No results for input(s): CHOL, HDL, LDLCALC, TRIG, CHOLHDL, LDLDIRECT in the last 72 hours. Thyroid Function Tests: No results for input(s): TSH, T4TOTAL, FREET4, T3FREE, THYROIDAB in the last 72 hours. Anemia  Panel: No results for input(s): VITAMINB12, FOLATE, FERRITIN, TIBC, IRON, RETICCTPCT in the last 72 hours. Sepsis Labs: Recent Labs  Lab 04/16/21 1850  LATICACIDVEN 1.2     Recent Results (from the past 240 hour(s))  Blood Culture (routine x 2)     Status: None (Preliminary result)   Collection Time: 04/16/21  7:05 PM   Specimen: BLOOD  Result Value Ref Range Status   Specimen Description BLOOD LEFT ANTECUBITAL  Final   Special Requests   Final    BOTTLES DRAWN AEROBIC AND ANAEROBIC Blood Culture results may not be optimal due to an inadequate volume of blood received in culture bottles   Culture   Final    NO GROWTH 3 DAYS Performed at [facility], [place] [address] [place], [place] [address]    Report Status PENDING  Incomplete  Blood Culture (routine x 2)     Status: None (Preliminary result)   Collection Time: 04/16/21  7:05 PM   Specimen: BLOOD   Result Value Ref Range Status   Specimen Description BLOOD SITE NOT SPECIFIED  Final   Special Requests   Final    BOTTLES DRAWN AEROBIC AND ANAEROBIC Blood Culture results may not be optimal due to an inadequate volume of blood received in culture bottles   Culture   Final    NO GROWTH 3 DAYS Performed at [facility], [place] [address], [place], [place] [address]    Report Status PENDING  Incomplete  Resp Panel by RT-PCR (Flu A&B, Covid) Nasopharyngeal Swab     Status: None   Collection Time: 04/16/21  7:35 PM   Specimen: Nasopharyngeal Swab; Nasopharyngeal(NP) swabs in vial transport medium  Result Value Ref Range Status   SARS Coronavirus 2 by RT PCR NEGATIVE NEGATIVE Final    Comment: (NOTE) SARS-CoV-2 target nucleic acids are NOT DETECTED.  The SARS-CoV-2 RNA is generally detectable in upper respiratory specimens during the acute phase of infection. The lowest concentration of SARS-CoV-2 viral copies this assay can detect is 138 copies/mL. A negative result does not preclude SARS-Cov-2 infection and should not be used as the sole basis for treatment or other patient management decisions. A negative result may occur with  improper specimen collection/handling, submission of specimen other than nasopharyngeal swab, presence of viral mutation(s) within the areas targeted by this assay, and inadequate number of viral copies(<138 copies/mL). A negative result must be combined with clinical observations, patient history, and epidemiological information. The expected result is Negative.  Fact Sheet for Patients:  EntrepreneurPulse.com.au  Fact Sheet for Healthcare Providers:  IncredibleEmployment.be  This test is no t yet approved or cleared by the [place] FDA and  has been authorized for detection and/or diagnosis of SARS-CoV-2 by FDA under an Emergency Use Authorization (EUA). This EUA will remain  in effect (meaning this test can  be used) for the duration of the COVID-19 declaration under Section 564(b)(1) of the Act, 21 U.S.C.section 360bbb-3(b)(1), unless the authorization is terminated  or revoked sooner.       Influenza A by PCR NEGATIVE NEGATIVE Final   Influenza B by PCR NEGATIVE NEGATIVE Final    Comment: (NOTE) The Xpert Xpress SARS-CoV-2/FLU/RSV plus assay is intended as an aid in the diagnosis of influenza from Nasopharyngeal swab specimens and should not be used as a sole basis for treatment. Nasal washings and aspirates are unacceptable for Xpert Xpress SARS-CoV-2/FLU/RSV testing.  Fact Sheet for Patients: EntrepreneurPulse.com.au  Fact Sheet for Healthcare Providers: IncredibleEmployment.be  This test is not yet approved or cleared by the [place] and has been authorized for detection and/or diagnosis of SARS-CoV-2 by FDA under an Emergency Use Authorization (EUA). This EUA will remain in effect (meaning this test can be used) for the duration of the COVID-19 declaration under Section 564(b)(1) of the Act, 21 U.S.C. section 360bbb-3(b)(1), unless the authorization is terminated or revoked.  Performed at [facility], [place] [address], [place], [place] [address]   Aerobic/Anaerobic Culture w Gram Stain (surgical/deep wound)     Status: None (Preliminary result)   Collection Time: 04/17/21  8:43 AM   Specimen: Abscess  Result Value Ref Range Status   Specimen Description ABSCESS  Final   Special Requests NONE  Final   Gram Stain   Final    ABUNDANT WBC PRESENT, PREDOMINANTLY PMN FEW GRAM POSITIVE COCCI IN PAIRS IN CLUSTERS RARE GRAM POSITIVE RODS FEW GRAM NEGATIVE RODS    Culture   Final    CULTURE REINCUBATED FOR BETTER GROWTH Performed at [facility], [place] [address], [place], [place] [address]    Report Status PENDING  Incomplete  Aerobic/Anaerobic Culture w Gram Stain (surgical/deep wound)     Status: None (Preliminary  result)   Collection Time: 04/17/21  8:47 AM   Specimen: Other Source; Body Fluid  Result Value Ref Range Status   Specimen Description FLUID  Final   Special Requests RIGHT CHEST WALL ABS SPEC B  Final   Gram Stain   Final    ABUNDANT WBC PRESENT, PREDOMINANTLY PMN MODERATE GRAM POSITIVE COCCI IN PAIRS    Culture   Final    NO GROWTH < 24 HOURS Performed at [facility], [address] [place], [place] [address]    Report Status PENDING  Incomplete          Radiology Studies: No results found.      Scheduled Meds:  amLODipine  10 mg Oral Daily   erythromycin  1 application Both Eyes G5X   gabapentin  400 mg Oral TID   insulin aspart  0-15 Units Subcutaneous TID WC   insulin aspart  0-5 Units Subcutaneous QHS   insulin aspart  5 Units Subcutaneous TID WC   insulin glargine-yfgn  30 Units Subcutaneous Daily   Continuous Infusions:  sodium chloride 125 mL/hr at 04/18/21 1722   piperacillin-tazobactam (ZOSYN)  IV 3.375 g (04/19/21 6468)          Aline August, MD [facility] 04/19/2021, 7:25 AM

## 2021-04-19 NOTE — Care Management Important Message (Signed)
Important Message  Patient Details  Name: Shirley [name] MRN: [id number] Date of Birth: 1975/12/19   Medicare Important Message Given:  Yes     Amarachukwu Lakatos 04/19/2021, 3:22 PM

## 2021-04-19 NOTE — Plan of Care (Signed)

## 2021-04-20 ENCOUNTER — Inpatient Hospital Stay (HOSPITAL_COMMUNITY): Payer: Medicare Other

## 2021-04-20 DIAGNOSIS — M25551 Pain in right hip: Secondary | ICD-10-CM

## 2021-04-20 DIAGNOSIS — L02211 Cutaneous abscess of abdominal wall: Secondary | ICD-10-CM

## 2021-04-20 LAB — BASIC METABOLIC PANEL
Anion gap: 11 (ref 5–15)
BUN: 36 mg/dL — ABNORMAL HIGH (ref 6–20)
CO2: 17 mmol/L — ABNORMAL LOW (ref 22–32)
Calcium: 8 mg/dL — ABNORMAL LOW (ref 8.9–10.3)
Chloride: 104 mmol/L (ref 98–111)
Creatinine, Ser: 4.27 mg/dL — ABNORMAL HIGH (ref 0.44–1.00)
GFR, Estimated: 12 mL/min — ABNORMAL LOW (ref >60)
Glucose, Bld: 194 mg/dL — ABNORMAL HIGH (ref 70–99)
Potassium: 4.2 mmol/L (ref 3.5–5.1)
Sodium: 132 mmol/L — ABNORMAL LOW (ref 135–145)

## 2021-04-20 LAB — CBC WITH DIFFERENTIAL/PLATELET
Abs Immature Granulocytes: 0.16 10*3/uL — ABNORMAL HIGH (ref 0.00–0.07)
Basophils Absolute: 0.1 10*3/uL (ref 0.0–0.1)
Basophils Relative: 0 %
Eosinophils Absolute: 0.4 10*3/uL (ref 0.0–0.5)
Eosinophils Relative: 2 %
HCT: 24.9 % — ABNORMAL LOW (ref 36.0–46.0)
Hemoglobin: 9.1 g/dL — ABNORMAL LOW (ref 12.0–15.0)
Immature Granulocytes: 1 %
Lymphocytes Relative: 16 %
Lymphs Abs: 3 10*3/uL (ref 0.7–4.0)
MCH: 27.8 pg (ref 26.0–34.0)
MCHC: 36.5 g/dL — ABNORMAL HIGH (ref 30.0–36.0)
MCV: 76.1 fL — ABNORMAL LOW (ref 80.0–100.0)
Monocytes Absolute: 1.3 10*3/uL — ABNORMAL HIGH (ref 0.1–1.0)
Monocytes Relative: 7 %
Neutro Abs: 14.2 10*3/uL — ABNORMAL HIGH (ref 1.7–7.7)
Neutrophils Relative %: 74 %
Platelets: 473 10*3/uL — ABNORMAL HIGH (ref 150–400)
RBC: 3.27 MIL/uL — ABNORMAL LOW (ref 3.87–5.11)
RDW: 14.5 % (ref 11.5–15.5)
WBC: 19.2 10*3/uL — ABNORMAL HIGH (ref 4.0–10.5)
nRBC: 0 % (ref 0.0–0.2)

## 2021-04-20 LAB — C-REACTIVE PROTEIN: CRP: 8.5 mg/dL — ABNORMAL HIGH (ref <1.0)

## 2021-04-20 LAB — GLUCOSE, CAPILLARY
Glucose-Capillary: 190 mg/dL — ABNORMAL HIGH (ref 70–99)
Glucose-Capillary: 202 mg/dL — ABNORMAL HIGH (ref 70–99)
Glucose-Capillary: 219 mg/dL — ABNORMAL HIGH (ref 70–99)
Glucose-Capillary: 110 mg/dL — ABNORMAL HIGH (ref 70–99)
Glucose-Capillary: 195 mg/dL — ABNORMAL HIGH (ref 70–99)

## 2021-04-20 LAB — CK: Total CK: 207 U/L (ref 38–234)

## 2021-04-20 LAB — MAGNESIUM: Magnesium: 2 mg/dL (ref 1.7–2.4)

## 2021-04-20 MED ORDER — STERILE WATER FOR INJECTION IV SOLN
INTRAVENOUS | Status: DC
  Filled 2021-04-20 (×2): qty 1000

## 2021-04-20 NOTE — Progress Notes (Signed)
Pt still refusing CPAP for the night

## 2021-04-20 NOTE — Progress Notes (Signed)
PROGRESS NOTE  Shirley [name] UXL:[id number] DOB: 11/05/1975 DOA: 04/16/2021 PCP: Fredrich Romans, PA   LOS: 4 days   Brief Narrative / Interim history: 45 y.o. female with medical history significant of poorly controlled insulin-dependent type 2 diabetes (A1c 12.3 a year ago), recurrent abscesses, hypertension, medical noncompliance, CKD stage IIIa, OSA with CPAP, CAD, CVA, anxiety, depression, chronic hep B, hypertension, hyperlipidemia, substance abuse presented to the ED for evaluation of abscess under her right breast with drainage.  On presentation, WBC was 24.2 with creatinine of 1.62.  CT showing edema and soft tissue stranding within the right greater than left mid to lower chest wall with evidence of a 3.8 x 2.9 cm gas and fluid collection in the subcutaneous fat consistent with soft tissue abscess.  Moderate gas tracking posteriorly along the right lateral chest wall.  Soft tissue inflammatory changes extend to the right upper quadrant abdominal wall.  She was started on IV fluids/antibiotics.  General surgery was consulted.  She underwent I&D of right chest wall abscess on 04/17/2021.  Subjective / 24h Interval events: States that she feels bad.  Complains of right hip pain which has been progressively worsening over the last couple of days.  She is barely able to walk due to pain.  Also complains of intermittent dyspnea.  She has also noticed a superficial swelling on her left abdomen.  Assessment & Plan: Principal Problem Sepsis due to chest wall abscess-patient was admitted to the hospital initially placed on broad-spectrum antibiotics.  She was febrile, tachycardic, tachypneic and had an elevated WBC.  Received IV fluids, general surgery consulted and she is status post I&D of right chest wall abscess on 12/25.  Currently on Zosyn, doxycycline.  Cultures are still pending.  Active Problems Right hip pain-obtain CT scan  Acute kidney injury on chronic kidney disease stage  IIIa-Baseline creatinine around 1.2-1.4, creatinine continues to rise.  Suspect multifactorial in the setting of sepsis/vancomycin use.  No other significant nephrotoxins.  Renal ultrasound without hydronephrosis, normal bladder.  Repeat urinalysis  Intermittent shortness of breath-suspect a degree of fluid overload.  Obtain a chest x-ray, decrease the rate of fluids  Hyponatremia -Mild.  Monitor   Left adnexal lesion -CT showing a 3.8 cm low density indeterminate left adnexal lesion. Will need nonemergent outpatient pelvic ultrasound for further evaluation.   Bacterial conjunctivitis -Continue erythromycin ointment   Poorly controlled insulin-dependent type 2 diabetes, with hyperglycemia - A1c is 13.5. Continue long-acting insulin along with CBGs with SSI  CBG (last 3)  Recent Labs    04/19/21 2351 04/20/21 0322 04/20/21 0754  GLUCAP 144* 190* 202*   Hypertension -Continue amlodipine   Diabetic neuropathy -Continue gabapentin   OSA -Continue nightly CPAP   CAD -No anginal symptoms.  Scheduled Meds:  acetaminophen  1,000 mg Oral Q6H   amLODipine  10 mg Oral Daily   erythromycin  1 application Both Eyes Z3G   gabapentin  400 mg Oral Daily   insulin aspart  0-15 Units Subcutaneous TID WC   insulin aspart  0-5 Units Subcutaneous QHS   insulin aspart  5 Units Subcutaneous TID WC   insulin glargine-yfgn  30 Units Subcutaneous Daily   polyethylene glycol  17 g Oral Daily   Continuous Infusions:  sodium chloride 125 mL/hr at 04/20/21 0121   doxycycline (VIBRAMYCIN) IV 100 mg (04/20/21 0939)   piperacillin-tazobactam (ZOSYN)  IV 2.25 g (04/20/21 0520)   PRN Meds:.docusate sodium, HYDROmorphone (DILAUDID) injection, methocarbamol, oxyCODONE  Diet Orders (From admission, onward)  Start     Ordered   04/17/21 1108  Diet heart healthy/carb modified Room service appropriate? Yes; Fluid consistency: Thin  Diet effective now       Question Answer Comment  Diet-HS Snack? Nothing    Room service appropriate? Yes   Fluid consistency: Thin      04/17/21 1107            DVT prophylaxis: SCDs Start: 04/17/21 1106     Code Status: Full Code  Family Communication: husband at bedside  Status is: Inpatient  Remains inpatient appropriate because: persistent leukocytosis, symptoms  Level of care: Telemetry Medical  Consultants:  General surgery   Microbiology  Rare staph epi, GPC, GPR, GNR  Antimicrobials: Zosyn, doxycycline   Objective: Vitals:   04/19/21 2353 04/20/21 0437 04/20/21 0500 04/20/21 0725  BP: (!) 148/75 (!) 145/79  (!) 147/90  Pulse: 84 92  82  Resp: 18 18  11   Temp: 97.9 F (36.6 C) 98.2 F (36.8 C)  98.1 F (36.7 C)  TempSrc: Oral Oral  Oral  SpO2: 100% 100%  99%  Weight:   120.1 kg   Height:        Intake/Output Summary (Last 24 hours) at 04/20/2021 0957 Last data filed at 04/19/2021 1600 Gross per 24 hour  Intake 3276.88 ml  Output --  Net 3276.88 ml   Filed Weights   04/16/21 1847 04/20/21 0500  Weight: 94 kg 120.1 kg    Examination:  Constitutional: NAD Eyes: no scleral icterus ENMT: Mucous membranes are moist.  Neck: normal, supple Respiratory: Diminished at the bases but no wheezing or obvious crackles heard Cardiovascular: Regular rate and rhythm, no murmurs / rubs / gallops.  1+ pitting lower extremity edema Abdomen: non distended, no tenderness. Bowel sounds positive.  Musculoskeletal: no clubbing / cyanosis.  Skin: no rashes Neurologic: CN 2-12 grossly intact. Strength 5/5 in all 4.   Data Reviewed: I have independently reviewed following labs and imaging studies   CBC: Recent Labs  Lab 04/16/21 1850 04/17/21 1322 04/18/21 0748 04/19/21 0248 04/20/21 0403  WBC 24.2* 25.2* 22.9* 20.5* 19.2*  NEUTROABS 18.6*  --  17.4* 13.9* 14.2*  HGB 11.4* 10.1* 10.6* 10.1* 9.1*  HCT 32.4* 27.9* 31.3* 29.6* 24.9*  MCV 78.6* 77.1* 78.8* 77.9* 76.1*  PLT 404* 382 441* 437* 856*   Basic Metabolic  Panel: Recent Labs  Lab 04/16/21 1850 04/18/21 0748 04/19/21 0248 04/20/21 0403  NA 133* 133* 134* 132*  K 3.9 4.4 4.0 4.2  CL 104 101 102 104  CO2 20* 20* 21* 17*  GLUCOSE 87 252* 135* 194*  BUN 16 26* 29* 36*  CREATININE 1.62* 2.96* 3.80* 4.27*  CALCIUM 8.4* 8.1* 8.0* 8.0*  MG  --  1.9 1.8 2.0   Liver Function Tests: Recent Labs  Lab 04/16/21 1850  AST 28  ALT 10  ALKPHOS 124  BILITOT 0.7  PROT 6.3*  ALBUMIN 1.8*   Coagulation Profile: Recent Labs  Lab 04/16/21 1850  INR 1.1   HbA1C: Recent Labs    04/17/21 1322  HGBA1C 13.5*   CBG: Recent Labs  Lab 04/19/21 1646 04/19/21 1925 04/19/21 2351 04/20/21 0322 04/20/21 0754  GLUCAP 183* 187* 144* 190* 202*    Recent Results (from the past 240 hour(s))  Blood Culture (routine x 2)     Status: None (Preliminary result)   Collection Time: 04/16/21  7:05 PM   Specimen: BLOOD  Result Value Ref Range Status   Specimen Description BLOOD  LEFT ANTECUBITAL  Final   Special Requests   Final    BOTTLES DRAWN AEROBIC AND ANAEROBIC Blood Culture results may not be optimal due to an inadequate volume of blood received in culture bottles   Culture   Final    NO GROWTH 4 DAYS Performed at [facility], [place] [address] [place], [place] [address]    Report Status PENDING  Incomplete  Blood Culture (routine x 2)     Status: None (Preliminary result)   Collection Time: 04/16/21  7:05 PM   Specimen: BLOOD  Result Value Ref Range Status   Specimen Description BLOOD SITE NOT SPECIFIED  Final   Special Requests   Final    BOTTLES DRAWN AEROBIC AND ANAEROBIC Blood Culture results may not be optimal due to an inadequate volume of blood received in culture bottles   Culture   Final    NO GROWTH 4 DAYS Performed at [facility], [place] [address], [place], [place] [address]    Report Status PENDING  Incomplete  Resp Panel by RT-PCR (Flu A&B, Covid) Nasopharyngeal Swab     Status: None   Collection Time: 04/16/21   7:35 PM   Specimen: Nasopharyngeal Swab; Nasopharyngeal(NP) swabs in vial transport medium  Result Value Ref Range Status   SARS Coronavirus 2 by RT PCR NEGATIVE NEGATIVE Final    Comment: (NOTE) SARS-CoV-2 target nucleic acids are NOT DETECTED.  The SARS-CoV-2 RNA is generally detectable in upper respiratory specimens during the acute phase of infection. The lowest concentration of SARS-CoV-2 viral copies this assay can detect is 138 copies/mL. A negative result does not preclude SARS-Cov-2 infection and should not be used as the sole basis for treatment or other patient management decisions. A negative result may occur with  improper specimen collection/handling, submission of specimen other than nasopharyngeal swab, presence of viral mutation(s) within the areas targeted by this assay, and inadequate number of viral copies(<138 copies/mL). A negative result must be combined with clinical observations, patient history, and epidemiological information. The expected result is Negative.  Fact Sheet for Patients:  EntrepreneurPulse.com.au  Fact Sheet for Healthcare Providers:  IncredibleEmployment.be  This test is no t yet approved or cleared by the [place] FDA and  has been authorized for detection and/or diagnosis of SARS-CoV-2 by FDA under an Emergency Use Authorization (EUA). This EUA will remain  in effect (meaning this test can be used) for the duration of the COVID-19 declaration under Section 564(b)(1) of the Act, 21 U.S.C.section 360bbb-3(b)(1), unless the authorization is terminated  or revoked sooner.       Influenza A by PCR NEGATIVE NEGATIVE Final   Influenza B by PCR NEGATIVE NEGATIVE Final    Comment: (NOTE) The Xpert Xpress SARS-CoV-2/FLU/RSV plus assay is intended as an aid in the diagnosis of influenza from Nasopharyngeal swab specimens and should not be used as a sole basis for treatment. Nasal washings and aspirates  are unacceptable for Xpert Xpress SARS-CoV-2/FLU/RSV testing.  Fact Sheet for Patients: EntrepreneurPulse.com.au  Fact Sheet for Healthcare Providers: IncredibleEmployment.be  This test is not yet approved or cleared by the [place] FDA and has been authorized for detection and/or diagnosis of SARS-CoV-2 by FDA under an Emergency Use Authorization (EUA). This EUA will remain in effect (meaning this test can be used) for the duration of the COVID-19 declaration under Section 564(b)(1) of the Act, 21 U.S.C. section 360bbb-3(b)(1), unless the authorization is terminated or revoked.  Performed at [facility], [address] [place], [place] [address]   Aerobic/Anaerobic Culture w Gram Stain (surgical/deep wound)     Status: None (Preliminary result)   Collection Time: 04/17/21  8:43 AM   Specimen: Abscess  Result Value Ref Range Status   Specimen Description ABSCESS  Final   Special Requests NONE  Final   Gram Stain   Final    ABUNDANT WBC PRESENT, PREDOMINANTLY PMN FEW GRAM POSITIVE COCCI IN PAIRS IN CLUSTERS RARE GRAM POSITIVE RODS FEW GRAM NEGATIVE RODS Performed at [facility], [place] [address] [place], [place] [address]    Culture   Final    RARE STAPHYLOCOCCUS EPIDERMIDIS SUSCEPTIBILITIES TO FOLLOW CULTURE REINCUBATED FOR BETTER GROWTH NO ANAEROBES ISOLATED; CULTURE IN PROGRESS FOR 5 DAYS    Report Status PENDING  Incomplete  Aerobic/Anaerobic Culture w Gram Stain (surgical/deep wound)     Status: None (Preliminary result)   Collection Time: 04/17/21  8:47 AM   Specimen: Other Source; Body Fluid  Result Value Ref Range Status   Specimen Description FLUID  Final   Special Requests RIGHT CHEST WALL ABS SPEC B  Final   Gram Stain   Final    ABUNDANT WBC PRESENT, PREDOMINANTLY PMN MODERATE GRAM POSITIVE COCCI IN PAIRS Performed at [facility], [address], [place], [place] [address]    Culture   Final     CULTURE REINCUBATED FOR BETTER GROWTH NO ANAEROBES ISOLATED; CULTURE IN PROGRESS FOR 5 DAYS    Report Status PENDING  Incomplete     Radiology Studies: US RENAL  Result Date: 04/19/2021 CLINICAL DATA:  Acute kidney injury, inpatient EXAM: RENAL / URINARY TRACT ULTRASOUND COMPLETE COMPARISON:  04/16/2021 CT chest, abdomen and pelvis FINDINGS: Right Kidney: Renal measurements: 11.2 x 5.7 x 6.8 cm = volume: 227 mL. Mildly echogenic right renal parenchyma, normal thickness. No hydronephrosis. No convincing shadowing renal stones. No renal masses. Left Kidney: Renal measurements: 10.7 x 6.4 x 5.2 cm = volume: 184 mL. Mildly echogenic left renal parenchyma, though. No hydronephrosis. No renal stones or masses. Bladder: Appears normal for degree of bladder distention. Other: None. IMPRESSION: 1. No hydronephrosis. 2. Echogenic kidneys, compatible with nonspecific acute renal parenchymal disease. 3. Normal bladder. Electronically Signed   By: Ilona Sorrel M.D.   On: 04/19/2021 13:36    Marzetta Board, MD, PhD [facility]  Between 7 am - 7 pm I am available, please contact me via Amion (for emergencies) or Securechat (non urgent messages)  Between 7 pm - 7 am I am not available, please contact night coverage MD/APP via Amion

## 2021-04-20 NOTE — Evaluation (Signed)
Physical Therapy Evaluation Patient Details Name: Shirley [name] MRN: [id number] DOB: 1975-09-17 Today's Date: 04/20/2021  History of Present Illness  45 y/o female presented to ED on 12/24 for evaluation of abscess under R breast with drainage. S/p I&D R chest wall abscess on 12/25. Now complaining of R hip pain. PMH: poorly controlled insulin-dependent type 2 diabetes (A1c 12.3 a year ago), recurrent abscesses, hypertension, medical noncompliance, CKD stage IIIa, OSA with CPAP, CAD, CVA, anxiety, depression, chronic hep B, hypertension, hyperlipidemia, substance abuse  Clinical Impression  Patient admitted with above diagnosis. Patient presents with generalized weakness, impaired balance, decreased activity tolerance, and pain. Patient required minA for transfers and min guard for ambulation with RW. Patient with complaints of 10/10 R hip pain at rest and with movement. Patient will benefit from skilled PT services during acute stay to address listed deficits. Recommend HHPT at discharge to maximize functional independence and safety. Barrier to discharging home is flight of stairs at this time.      Recommendations for follow up therapy are one component of a multi-disciplinary discharge planning process, led by the attending physician.  Recommendations may be updated based on patient status, additional functional criteria and insurance authorization.  Follow Up Recommendations Home health PT    Assistance Recommended at Discharge Frequent or constant Supervision/Assistance  Functional Status Assessment Patient has had a recent decline in their functional status and demonstrates the ability to make significant improvements in function in a reasonable and predictable amount of time.  Equipment Recommendations  Rolling Rion Schnitzer (2 wheels)    Recommendations for Other Services       Precautions / Restrictions Precautions Precautions: Fall Restrictions Weight Bearing Restrictions: No       Mobility  Bed Mobility Overal bed mobility: Needs Assistance Bed Mobility: Supine to Sit;Sit to Supine     Supine to sit: Min assist Sit to supine: Min assist   General bed mobility comments: MinA to bring R LE off bed and trunk elevation. Assist to bring LEs back on bed    Transfers Overall transfer level: Needs assistance Equipment used: None;Rolling Eldrick Penick (2 wheels) Transfers: Sit to/from Stand Sit to Stand: Min assist;Mod assist           General transfer comment: minA from elevated surface. ModA to stand from commode    Ambulation/Gait Ambulation/Gait assistance: Min guard Gait Distance (Feet): 10 Feet (10') Assistive device: 1 person hand held assist;Rolling Devion Chriscoe (2 wheels) Gait Pattern/deviations: Step-to pattern;Decreased stride length Gait velocity: decreased.     General Gait Details: initially with HHAx1 and min guard but patient reaching for objects. Ambulated 10' with Rw and min guard for improved stability  Stairs            Wheelchair Mobility    Modified Rankin (Stroke Patients Only)       Balance Overall balance assessment: Needs assistance Sitting-balance support: No upper extremity supported;Feet supported Sitting balance-Leahy Scale: Fair     Standing balance support: Bilateral upper extremity supported;During functional activity Standing balance-Leahy Scale: Poor Standing balance comment: reliant on UE support                             Pertinent Vitals/Pain Pain Assessment: 0-10 Pain Score: 10-Worst pain ever Pain Location: R hip, R breast Pain Descriptors / Indicators: Grimacing Pain Intervention(s): Monitored during session    Home Living Family/patient expects to be discharged to:: Private residence Living Arrangements: Spouse/significant other Available Help at  Discharge: Family;Available PRN/intermittently Type of Home: Apartment Home Access: Stairs to enter Entrance Stairs-Rails: Right Entrance  Stairs-Number of Steps: flight   Home Layout: One level Home Equipment: None      Prior Function Prior Level of Function : Independent/Modified Independent                     Hand Dominance        Extremity/Trunk Assessment   Upper Extremity Assessment Upper Extremity Assessment: Generalized weakness    Lower Extremity Assessment Lower Extremity Assessment: Generalized weakness;RLE deficits/detail RLE Deficits / Details: R hip pain with any movement    Cervical / Trunk Assessment Cervical / Trunk Assessment: Normal  Communication   Communication: No difficulties  Cognition Arousal/Alertness: Awake/alert Behavior During Therapy: Flat affect Overall Cognitive Status: Within Functional Limits for tasks assessed                                          General Comments      Exercises     Assessment/Plan    PT Assessment Patient needs continued PT services  PT Problem List Decreased strength;Decreased activity tolerance;Decreased balance;Decreased mobility;Decreased knowledge of use of DME       PT Treatment Interventions DME instruction;Gait training;Functional mobility training;Therapeutic activities;Therapeutic exercise;Balance training;Stair training;Patient/family education    PT Goals (Current goals can be found in the Care Plan section)  Acute Rehab PT Goals Patient Stated Goal: to reduce pain PT Goal Formulation: With patient Time For Goal Achievement: 05/04/21 Potential to Achieve Goals: Good    Frequency Min 3X/week   Barriers to discharge        Co-evaluation               AM-PAC PT "6 Clicks" Mobility  Outcome Measure Help needed turning from your back to your side while in a flat bed without using bedrails?: A Assad Help needed moving from lying on your back to sitting on the side of a flat bed without using bedrails?: A Devito Help needed moving to and from a bed to a chair (including a wheelchair)?: A  [place] Help needed standing up from a chair using your arms (e.g., wheelchair or bedside chair)?: A Lot Help needed to walk in hospital room?: A [place] Help needed climbing 3-5 steps with a railing? : A Lot 6 Click Score: 16    End of Session Equipment Utilized During Treatment: Gait belt Activity Tolerance: Patient limited by pain Patient left: in bed;with call bell/phone within reach;with bed alarm set Nurse Communication: Mobility status PT Visit Diagnosis: Unsteadiness on feet (R26.81);Muscle weakness (generalized) (M62.81);Pain Pain - Right/Left: Right Pain - part of body: Hip    Time: 4098-1191 PT Time Calculation (min) (ACUTE ONLY): 24 min   Charges:   PT Evaluation $PT Eval Moderate Complexity: 1 Mod PT Treatments $Therapeutic Activity: 8-22 mins        Deiondra Denley A. Dan Humphreys PT, DPT Acute Rehabilitation Services Pager [phone] Office [phone]   Viviann Spare 04/20/2021, 5:35 PM

## 2021-04-20 NOTE — Progress Notes (Signed)
3 Days Post-Op  Subjective: CC: Patient reports pain at the incision site that is stable from yesterday but tolerable with pain medications. She reports her dressing was not changed since I did this yesterday morning.   She is tolerating a diet without abdominal pain, n/v. She reports she is mobilizing in the room with the assistance of the IV pole and her husband. Voiding without difficulty. No BM.   To note after I left the room patient complained of R hip pain to my attending. TRh getting a CT  Objective: Vital signs in last 24 hours: Temp:  [97.9 F (36.6 C)-100 F (37.8 C)] 98.1 F (36.7 C) (12/28 0725) Pulse Rate:  [82-96] 82 (12/28 0725) Resp:  [11-22] 11 (12/28 0725) BP: (130-148)/(75-90) 147/90 (12/28 0725) SpO2:  [96 %-100 %] 99 % (12/28 0725) Weight:  [120.1 kg] 120.1 kg (12/28 0500) Last BM Date: 04/17/21  Intake/Output from previous day: 12/27 0701 - 12/28 0700 In: 3276.9 [I.V.:2713.8; IV Piggyback:563.1] Out: -  Intake/Output this shift: No intake/output data recorded.  PE: Gen:  Alert, NAD, pleasant Card:  RRR Pulm: Normal rate and effort  Abd: Soft, ND, NT  Wound: Chaperone present (NT). Wound stable from yesterday. Right chest wall incision measuring ~4x2cm. Packing removed with some bloody drainage and without purulent drainage present on dressing. There is induration measuring 5cm superior medially, 4cm superiorly and 5cm superior laterally from incision. Minimal induration inferiorly. Wound base with some mixed granulation and fibropurulent tissue. There is tunneling tracking 5cm superior medially, 4cm superiorly and 5cm superior laterally coinciding with above induration. Minimal tunneling inferiorly. There was a small amount of bloody fluid from the wound but no purulent drainage. No overlying erythema.  Lab Results:  Recent Labs    04/19/21 0248 04/20/21 0403  WBC 20.5* 19.2*  HGB 10.1* 9.1*  HCT 29.6* 24.9*  PLT 437* 473*   BMET Recent Labs     04/19/21 0248 04/20/21 0403  NA 134* 132*  K 4.0 4.2  CL 102 104  CO2 21* 17*  GLUCOSE 135* 194*  BUN 29* 36*  CREATININE 3.80* 4.27*  CALCIUM 8.0* 8.0*   PT/INR No results for input(s): LABPROT, INR in the last 72 hours. CMP     Component Value Date/Time   NA 132 (L) 04/20/2021 0403   NA 137 02/07/2017 1528   K 4.2 04/20/2021 0403   CL 104 04/20/2021 0403   CO2 17 (L) 04/20/2021 0403   GLUCOSE 194 (H) 04/20/2021 0403   BUN 36 (H) 04/20/2021 0403   BUN 11 02/07/2017 1528   CREATININE 4.27 (H) 04/20/2021 0403   CREATININE 0.76 07/15/2012 1419   CALCIUM 8.0 (L) 04/20/2021 0403   PROT 6.3 (L) 04/16/2021 1850   PROT 7.1 02/07/2017 1528   ALBUMIN 1.8 (L) 04/16/2021 1850   ALBUMIN 3.7 02/07/2017 1528   AST 28 04/16/2021 1850   ALT 10 04/16/2021 1850   ALKPHOS 124 04/16/2021 1850   BILITOT 0.7 04/16/2021 1850   BILITOT 0.3 02/07/2017 1528   GFRNONAA 12 (L) 04/20/2021 0403   GFRAA 40 (L) 01/03/2020 2017   Lipase     Component Value Date/Time   LIPASE 24 02/09/2020 1439    Studies/Results: US RENAL  Result Date: 04/19/2021 CLINICAL DATA:  Acute kidney injury, inpatient EXAM: RENAL / URINARY TRACT ULTRASOUND COMPLETE COMPARISON:  04/16/2021 CT chest, abdomen and pelvis FINDINGS: Right Kidney: Renal measurements: 11.2 x 5.7 x 6.8 cm = volume: 227 mL. Mildly echogenic right renal parenchyma,  normal thickness. No hydronephrosis. No convincing shadowing renal stones. No renal masses. Left Kidney: Renal measurements: 10.7 x 6.4 x 5.2 cm = volume: 184 mL. Mildly echogenic left renal parenchyma, though. No hydronephrosis. No renal stones or masses. Bladder: Appears normal for degree of bladder distention. Other: None. IMPRESSION: 1. No hydronephrosis. 2. Echogenic kidneys, compatible with nonspecific acute renal parenchymal disease. 3. Normal bladder. Electronically Signed   By: Ilona Sorrel M.D.   On: 04/19/2021 13:36    Anti-infectives: Anti-infectives (From admission,  onward)    Start     Dose/Rate Route Frequency Ordered Stop   04/19/21 2200  piperacillin-tazobactam (ZOSYN) IVPB 2.25 g        2.25 g 100 mL/hr over 30 Minutes Intravenous Every 8 hours 04/19/21 1840     04/19/21 0815  doxycycline (VIBRAMYCIN) 100 mg in sodium chloride 0.9 % 250 mL IVPB        100 mg 125 mL/hr over 120 Minutes Intravenous Every 12 hours 04/19/21 0729     04/18/21 1000  Vancomycin (VANCOCIN) 1,250 mg in sodium chloride 0.9 % 250 mL IVPB  Status:  Discontinued        1,250 mg 166.7 mL/hr over 90 Minutes Intravenous Every 36 hours 04/16/21 2101 04/18/21 0127   04/18/21 1000  vancomycin (VANCOREADY) IVPB 1250 mg/250 mL  Status:  Discontinued        1,250 mg 166.7 mL/hr over 90 Minutes Intravenous Every 36 hours 04/18/21 0127 04/18/21 1103   04/17/21 0600  piperacillin-tazobactam (ZOSYN) IVPB 3.375 g  Status:  Discontinued        3.375 g 12.5 mL/hr over 240 Minutes Intravenous Every 8 hours 04/16/21 2059 04/19/21 1840   04/16/21 2000  vancomycin (VANCOCIN) 1,750 mg in sodium chloride 0.9 % 500 mL IVPB        1,750 mg 250 mL/hr over 120 Minutes Intravenous  Once 04/16/21 1947 04/17/21 0038   04/16/21 2000  piperacillin-tazobactam (ZOSYN) IVPB 3.375 g        3.375 g 100 mL/hr over 30 Minutes Intravenous  Once 04/16/21 1947 04/16/21 2059        Assessment/Plan POD 3 s/p I&D of R chest wall abscess by Dr. Bobbye Morton - 04/17/2021 - AF this am. Tmax 100.4 yesterday AM. WBC persistently elevated but downtrending slightly (25 > 22 > 20.5 > 19.2) - Cx's with rare staph epi and re-incubated for better growth. Gram stain with gram + cocci in pairs and clusters, gram + rods and gram - rods. Bcx NGTD.  - Cont broad spectrum abx till susceptibilities result - Cont BID WTD  - Pulm toilet - Mobilize, PT eval - No current indication for further debridement. We will follow with you    FEN - HH/CM, IVF per TRH VTE - SCDs, okay for chemical prophylaxis from a general surgery  standpoint ID - Vanc 12/24 - 12/26. Doxy 12/27 >> Zosyn 12/24 >>    - Per TRH -  AKI - unclear etiology. Discussed with TRH today who is working this up DM2 HTN HLD OSA CAD Hx CVA Hx Hep B   LOS: 4 days    Jillyn Ledger , [facility] 04/20/2021, 10:00 AM Please see Amion for pager number during day hours 7:00am-4:30pm

## 2021-04-20 NOTE — TOC Initial Note (Signed)
Transition of Care [facility]) - Initial/Assessment Note    Patient Details  Name: Shirley [name] MRN: [id number] Date of Birth: June 08, 1975  Transition of Care [facility]) CM/SW Contact:    Pollie Friar, RN Phone Number: 04/20/2021, 11:14 AM  Clinical Narrative:                 Patient is from home with spouse. Spouse at the bedside and able to provided needed supervision and assistance at home.  No home DME.  Pt denies issues with home medications or transportation.  TOC following for d/c needs.   Expected Discharge Plan: Home/Self Care Barriers to Discharge: Continued Medical Work up   Patient Goals and CMS Choice        Expected Discharge Plan and Services Expected Discharge Plan: Home/Self Care   Discharge Planning Services: CM Consult   Living arrangements for the past 2 months: Single Family Home                                      Prior Living Arrangements/Services Living arrangements for the past 2 months: Single Family Home Lives with:: Spouse Patient language and need for interpreter reviewed:: Yes Do you feel safe going back to the place where you live?: Yes        Care giver support system in place?: Yes (comment)   Criminal Activity/Legal Involvement Pertinent to Current Situation/Hospitalization: No - Comment as needed  Activities of Daily Living      Permission Sought/Granted                  Emotional Assessment Appearance:: Appears stated age Attitude/Demeanor/Rapport: Engaged Affect (typically observed): Accepting Orientation: : Oriented to Self, Oriented to Place, Oriented to  Time, Oriented to Situation   Psych Involvement: No (comment)  Admission diagnosis:  Abscess [L02.91] Abdominal wall abscess [L02.211] Acute bacterial conjunctivitis of both eyes [H10.33] Sepsis, due to unspecified organism, unspecified whether acute organ dysfunction present [facility]) [A41.9] Patient Active Problem List   Diagnosis Date Noted   Sepsis  ([place]) 04/17/2021   Conjunctivitis 04/17/2021   Abscess 04/16/2021   Abscess of bursa of right foot    Diabetic foot infection ([place]) 04/06/2020   Cellulitis of foot, right    Puncture wound of right foot    Sepsis due to pneumonia ([place]) 11/24/2019   Thrombocytosis 11/24/2019   Chronic pain 11/24/2019   History of pulmonary embolism 10/10/2017   Chronic anticoagulation 10/10/2017   GERD (gastroesophageal reflux disease) 10/10/2017   Leukocytosis 10/10/2017   Prolonged QT interval 10/10/2017   Nausea & vomiting 09/20/2017   Hypertensive urgency 09/20/2017   Intractable nausea and vomiting 08/08/2017   Chronic maxillary sinusitis 01/02/2017   Acute blood loss anemia 11/13/2016   Menorrhagia 11/13/2016   Bilateral pulmonary embolism ([place]) 10/30/2016   Acute DVT (deep venous thrombosis) ([place]) 10/30/2016   Nausea and vomiting 09/23/2016   Type 2 diabetes mellitus with hyperglycemia ([place]) 04/07/2016   Diabetic gastroparesis ([place]) 04/06/2016   Dehydration 03/27/2015   Gastroparesis 11/11/2014   Hematemesis 10/13/2014   DKA (diabetic ketoacidoses) 10/13/2014   Abdominal pain, chronic, left lower quadrant    Diabetic gastroparesis associated with type 2 diabetes mellitus ([place]) 75/17/0017   History of Helicobacter pylori infection 04/22/2014   Vaginal discharge 02/18/2014   Unspecified constipation 07/21/2013   Tinea corporis 07/21/2013   Intractable vomiting 04/29/2013   Dysmenorrhea 04/22/2013   UTI (urinary  tract infection) 07/15/2012   Headache(784.0) 02/08/2012   Health care maintenance 01/22/2012   Chronic hepatitis B ([place]) 03/07/2011   History of leukocytosis 04/06/2010   THROMBOCYTOSIS 04/06/2010   Polysubstance abuse ([place]) 02/23/2010   Iron deficiency anemia 11/22/2009   PERIPHERAL NEUROPATHY 10/01/2009   Hyperlipidemia 08/30/2009   Diabetic polyneuropathy ([place]) 08/30/2009   Hidradenitis (recurrent boils) 07/07/2008   Depression 12/27/2007   Abdominal pain, left lower  quadrant 11/21/2007   FIBROMYALGIA 10/30/2007   BACK PAIN 04/01/2007   OBSTRUCTIVE SLEEP APNEA 01/17/2007   ANXIETY DEPRESSION 06/27/2006   Chronic ischemic heart disease 06/15/2006   OBESITY, MORBID 05/15/2006   MIGRAINE HEADACHE 05/15/2006   Asthma 05/15/2006   Essential hypertension 01/16/2006   IRREGULAR MENSTRUATION 01/16/2006   PEDAL EDEMA 01/16/2006   Poorly controlled type 2 diabetes mellitus with peripheral neuropathy ([place]) 01/16/1989   PCP:  Fredrich Romans, [place] Pharmacy:   [facility] [place], [place] [address] [place] [address] Phone: [phone] Fax: [phone]     Social Determinants of Health (SDOH) Interventions    Readmission Risk Interventions No flowsheet data found.

## 2021-04-21 ENCOUNTER — Inpatient Hospital Stay (HOSPITAL_COMMUNITY): Payer: Medicare Other

## 2021-04-21 LAB — URINALYSIS, COMPLETE (UACMP) WITH MICROSCOPIC
Bilirubin Urine: NEGATIVE
Glucose, UA: 50 mg/dL — AB
Hgb urine dipstick: NEGATIVE
Ketones, ur: NEGATIVE mg/dL
Leukocytes,Ua: NEGATIVE
Nitrite: NEGATIVE
Protein, ur: 100 mg/dL — AB
Specific Gravity, Urine: 1.013 (ref 1.005–1.030)
pH: 5 (ref 5.0–8.0)

## 2021-04-21 LAB — GLUCOSE, CAPILLARY
Glucose-Capillary: 200 mg/dL — ABNORMAL HIGH (ref 70–99)
Glucose-Capillary: 110 mg/dL — ABNORMAL HIGH (ref 70–99)
Glucose-Capillary: 155 mg/dL — ABNORMAL HIGH (ref 70–99)
Glucose-Capillary: 74 mg/dL (ref 70–99)

## 2021-04-21 LAB — BASIC METABOLIC PANEL
Anion gap: 10 (ref 5–15)
BUN: 36 mg/dL — ABNORMAL HIGH (ref 6–20)
CO2: 19 mmol/L — ABNORMAL LOW (ref 22–32)
Calcium: 7.8 mg/dL — ABNORMAL LOW (ref 8.9–10.3)
Chloride: 102 mmol/L (ref 98–111)
Creatinine, Ser: 3.69 mg/dL — ABNORMAL HIGH (ref 0.44–1.00)
GFR, Estimated: 15 mL/min — ABNORMAL LOW (ref >60)
Glucose, Bld: 202 mg/dL — ABNORMAL HIGH (ref 70–99)
Potassium: 4.2 mmol/L (ref 3.5–5.1)
Sodium: 131 mmol/L — ABNORMAL LOW (ref 135–145)

## 2021-04-21 LAB — CBC
HCT: 27.7 % — ABNORMAL LOW (ref 36.0–46.0)
Hemoglobin: 9.5 g/dL — ABNORMAL LOW (ref 12.0–15.0)
MCH: 26.7 pg (ref 26.0–34.0)
MCHC: 34.3 g/dL (ref 30.0–36.0)
MCV: 77.8 fL — ABNORMAL LOW (ref 80.0–100.0)
Platelets: 462 10*3/uL — ABNORMAL HIGH (ref 150–400)
RBC: 3.56 MIL/uL — ABNORMAL LOW (ref 3.87–5.11)
RDW: 14.2 % (ref 11.5–15.5)
WBC: 15.7 10*3/uL — ABNORMAL HIGH (ref 4.0–10.5)
nRBC: 0 % (ref 0.0–0.2)

## 2021-04-21 LAB — MRSA NEXT GEN BY PCR, NASAL: MRSA by PCR Next Gen: DETECTED — AB

## 2021-04-21 LAB — CULTURE, BLOOD (ROUTINE X 2)
Culture: NO GROWTH
Culture: NO GROWTH

## 2021-04-21 MED ORDER — PIPERACILLIN-TAZOBACTAM 3.375 G IVPB
3.3750 g | Freq: Three times a day (TID) | INTRAVENOUS | Status: DC
  Administered 2021-04-21 – 2021-04-22 (×4): 3.375 g via INTRAVENOUS
  Filled 2021-04-21 (×4): qty 50

## 2021-04-21 NOTE — Progress Notes (Signed)
4 Days Post-Op   Subjective/Chief Complaint: Right thigh/hip pain about the same.  Now complains of LUQ abdominal wall swelling    Objective: Vital signs in last 24 hours: Temp:  [97.7 F (36.5 C)-98.1 F (36.7 C)] 98.1 F (36.7 C) (12/29 0907) Pulse Rate:  [82-98] 93 (12/29 0907) Resp:  [10-20] 14 (12/29 0907) BP: (133-168)/(81-107) 164/93 (12/29 0907) SpO2:  [98 %-100 %] 98 % (12/29 0907) Weight:  [123.7 kg] 123.7 kg (12/29 0600) Last BM Date: 04/20/21  Intake/Output from previous day: 12/28 0701 - 12/29 0700 In: 1707.2 [I.V.:769.2; IV Piggyback:938] Out: -  Intake/Output this shift: No intake/output data recorded.  Exam: Awake and alert No skin changes in area of concern with right thigh/hip.  No erythema, crepitus, induration, or fluctuance Abdomen soft with questionable LUQ tenderness but no skin changes, abnormalities noted Dressing intact to right lower chest  Lab Results:  Recent Labs    04/20/21 0403 04/21/21 0304  WBC 19.2* 15.7*  HGB 9.1* 9.5*  HCT 24.9* 27.7*  PLT 473* 462*   BMET Recent Labs    04/20/21 0403 04/21/21 0304  NA 132* 131*  K 4.2 4.2  CL 104 102  CO2 17* 19*  GLUCOSE 194* 202*  BUN 36* 36*  CREATININE 4.27* 3.69*  CALCIUM 8.0* 7.8*   PT/INR No results for input(s): LABPROT, INR in the last 72 hours. ABG No results for input(s): PHART, HCO3 in the last 72 hours.  Invalid input(s): PCO2, PO2  Studies/Results: DG Chest 1 View  Result Date: 04/20/2021 CLINICAL DATA:  Shortness of breath and dry cough. EXAM: CHEST  1 VIEW COMPARISON:  Chest x-ray and chest CT 04/16/2021 FINDINGS: The cardiac silhouette, mediastinal and hilar contours are within normal limits. Patchy left basilar density could be atelectasis or early infiltrate. No pleural effusions. IMPRESSION: Patchy left basilar density, atelectasis versus early infiltrate. Electronically Signed   By: Marijo Sanes M.D.   On: 04/20/2021 15:27   US RENAL  Result Date:  04/19/2021 CLINICAL DATA:  Acute kidney injury, inpatient EXAM: RENAL / URINARY TRACT ULTRASOUND COMPLETE COMPARISON:  04/16/2021 CT chest, abdomen and pelvis FINDINGS: Right Kidney: Renal measurements: 11.2 x 5.7 x 6.8 cm = volume: 227 mL. Mildly echogenic right renal parenchyma, normal thickness. No hydronephrosis. No convincing shadowing renal stones. No renal masses. Left Kidney: Renal measurements: 10.7 x 6.4 x 5.2 cm = volume: 184 mL. Mildly echogenic left renal parenchyma, though. No hydronephrosis. No renal stones or masses. Bladder: Appears normal for degree of bladder distention. Other: None. IMPRESSION: 1. No hydronephrosis. 2. Echogenic kidneys, compatible with nonspecific acute renal parenchymal disease. 3. Normal bladder. Electronically Signed   By: Ilona Sorrel M.D.   On: 04/19/2021 13:36   CT HIP RIGHT WO CONTRAST  Result Date: 04/20/2021 CLINICAL DATA:  Right hip pain. EXAM: CT OF THE RIGHT HIP WITHOUT CONTRAST TECHNIQUE: Multidetector CT imaging of the right hip was performed according to the standard protocol. Multiplanar CT image reconstructions were also generated. COMPARISON:  None. FINDINGS: The right hip is normally located. Mild degenerative changes but no fracture or AVN. The visualized right hemipelvic bony structures are intact. Nonspecific inflammatory changes are noted in the subcutaneous fat surrounding the right hip and pelvis. No obvious hip joint effusion. No focal fluid collections. The hip and pelvic musculature are grossly normal by CT. No obvious muscle tear or intramuscular hematoma. No significant intrapelvic findings are identified IMPRESSION: 1. Nonspecific inflammatory changes in the subcutaneous fat surrounding the right hip and pelvis.  No focal fluid collections. 2. No acute bony findings or worrisome bone lesions. Electronically Signed   By: Marijo Sanes M.D.   On: 04/20/2021 15:24    Anti-infectives: Anti-infectives (From admission, onward)    Start      Dose/Rate Route Frequency Ordered Stop   04/19/21 2200  piperacillin-tazobactam (ZOSYN) IVPB 2.25 g        2.25 g 100 mL/hr over 30 Minutes Intravenous Every 8 hours 04/19/21 1840     04/19/21 0815  doxycycline (VIBRAMYCIN) 100 mg in sodium chloride 0.9 % 250 mL IVPB        100 mg 125 mL/hr over 120 Minutes Intravenous Every 12 hours 04/19/21 0729     04/18/21 1000  Vancomycin (VANCOCIN) 1,250 mg in sodium chloride 0.9 % 250 mL IVPB  Status:  Discontinued        1,250 mg 166.7 mL/hr over 90 Minutes Intravenous Every 36 hours 04/16/21 2101 04/18/21 0127   04/18/21 1000  vancomycin (VANCOREADY) IVPB 1250 mg/250 mL  Status:  Discontinued        1,250 mg 166.7 mL/hr over 90 Minutes Intravenous Every 36 hours 04/18/21 0127 04/18/21 1103   04/17/21 0600  piperacillin-tazobactam (ZOSYN) IVPB 3.375 g  Status:  Discontinued        3.375 g 12.5 mL/hr over 240 Minutes Intravenous Every 8 hours 04/16/21 2059 04/19/21 1840   04/16/21 2000  vancomycin (VANCOCIN) 1,750 mg in sodium chloride 0.9 % 500 mL IVPB        1,750 mg 250 mL/hr over 120 Minutes Intravenous  Once 04/16/21 1947 04/17/21 0038   04/16/21 2000  piperacillin-tazobactam (ZOSYN) IVPB 3.375 g        3.375 g 100 mL/hr over 30 Minutes Intravenous  Once 04/16/21 1947 04/16/21 2059       Assessment/Plan: POD 4 s/p I&D of R chest wall abscess by Dr. Bobbye Morton - 04/17/2021  Continue wound care and antibiotics.  WBC continues downward trend No plans for further surgery. Right hip CT fairly unremarkable as is her exam   - Per TRH -  AKI  DM2 HTN HLD OSA CAD Hx CVA Hx Hep B  Coralie Keens MD 04/21/2021

## 2021-04-21 NOTE — Progress Notes (Signed)
Pharmacy Antibiotic Note  Shirley [name] is a 45 y.o. female admitted on 04/16/2021 presenting with draining abscess, fever.  Pharmacy had been consulted for Vancomycin and Zosyn dosing.On 12/26 patient developed AKI, Scr peaked at 4.27, now trending back down at 3.69 (bsl ~1.2-1.3). Vancomycin discontinued after 1st dose. Random vancomycin level the following morning at 11.   Plan: Doxycycline 100mg  Q12h Zosyn 3.375gm IV Q8h  Follow up random vancomycin level in AM  Height: 5\' 7"  (170.2 cm) Weight: 123.7 kg (272 lb 11.3 oz) IBW/kg (Calculated) : 61.6  Temp (24hrs), Avg:97.9 F (36.6 C), Min:97.7 F (36.5 C), Max:98.1 F (36.7 C)  Recent Labs  Lab 04/16/21 1850 04/17/21 1322 04/18/21 0748 04/19/21 0248 04/20/21 0403 04/21/21 0304  WBC 24.2* 25.2* 22.9* 20.5* 19.2* 15.7*  CREATININE 1.62*  --  2.96* 3.80* 4.27* 3.69*  LATICACIDVEN 1.2  --   --   --   --   --   VANCORANDOM  --   --   --  11  --   --     Estimated Creatinine Clearance: 25.7 mL/min (A) (by C-G formula based on SCr of 3.69 mg/dL (H)).    Allergies  Allergen Reactions   Lisinopril Nausea Only and Swelling   Morphine And Related Itching and Swelling    Thank you for allowing pharmacy to be a part of this patients care.  Ardyth Harps, PharmD Clinical Pharmacist

## 2021-04-21 NOTE — Progress Notes (Signed)
PT Cancellation Note  Patient Details Name: Shirley [name] MRN: [id number] DOB: 28-Jun-1975   Cancelled Treatment:    Reason Eval/Treat Not Completed: (P) Other (comment) (pt eating lunch in bed, requesting PTA return later in day.) Will continue efforts per PT plan of care as schedule permits.   Kara Pacer Alynah Schone 04/21/2021, 2:41 PM

## 2021-04-21 NOTE — Progress Notes (Signed)
PROGRESS NOTE  Shirley [name] WIO:[id number] DOB: 10-Oct-1975 DOA: 04/16/2021 PCP: Fredrich Romans, PA   LOS: 5 days   Brief Narrative / Interim history: 45 y.o. female with medical history significant of poorly controlled insulin-dependent type 2 diabetes (A1c 12.3 a year ago), recurrent abscesses, hypertension, medical noncompliance, CKD stage IIIa, OSA with CPAP, CAD, CVA, anxiety, depression, chronic hep B, hypertension, hyperlipidemia, substance abuse presented to the ED for evaluation of abscess under her right breast with drainage.  On presentation, WBC was 24.2 with creatinine of 1.62.  CT showing edema and soft tissue stranding within the right greater than left mid to lower chest wall with evidence of a 3.8 x 2.9 cm gas and fluid collection in the subcutaneous fat consistent with soft tissue abscess.  Moderate gas tracking posteriorly along the right lateral chest wall.  Soft tissue inflammatory changes extend to the right upper quadrant abdominal wall.  She was started on IV fluids/antibiotics.  General surgery was consulted.  She underwent I&D of right chest wall abscess on 04/17/2021.  Subjective / 24h Interval events: She continues to have hip pain.  Appreciates left upper abdomen is a lot more swollen today  Assessment & Plan: Principal Problem Sepsis due to chest wall abscess-patient was admitted to the hospital initially placed on broad-spectrum antibiotics.  She was febrile, tachycardic, tachypneic and had an elevated WBC.  Received IV fluids, general surgery consulted and she is status post I&D of right chest wall abscess on 12/25.  Currently on Zosyn, doxycycline.  Culture showing rare staph epi, few actinomyces neuii, final report still pending  Active Problems Right hip pain-CT scan of the right hip without acute findings but it did show nonspecific inflammatory changes in the subcutaneous fat surrounding the right hip and pelvis without focal fluid collections.  Left upper  abdominal swelling-no skin changes on the surface, this appears superficial.  Obtain ultrasound  Acute kidney injury on chronic kidney disease stage IIIa-Baseline creatinine around 1.2-1.4, creatinine still elevated but with improvement today.  Suspect multifactorial in the setting of sepsis/vancomycin use.  No other significant nephrotoxins.  Renal ultrasound without hydronephrosis, normal bladder.   Intermittent shortness of breath-chest x-ray done yesterday shows atelectasis versus early infiltrate.  On antibiotics.  Hyponatremia -Mild.  Monitor   Left adnexal lesion -CT showing a 3.8 cm low density indeterminate left adnexal lesion. Will need nonemergent outpatient pelvic ultrasound for further evaluation.   Bacterial conjunctivitis -Continue erythromycin ointment   Poorly controlled insulin-dependent type 2 diabetes, with hyperglycemia - A1c is 13.5. Continue long-acting insulin along with CBGs with SSI  CBG (last 3)  Recent Labs    04/20/21 1635 04/20/21 2159 04/21/21 0606  GLUCAP 110* 195* 200*    Hypertension -Continue amlodipine   Diabetic neuropathy -Continue gabapentin   OSA -Continue nightly CPAP   CAD -No anginal symptoms.  Scheduled Meds:  acetaminophen  1,000 mg Oral Q6H   amLODipine  10 mg Oral Daily   erythromycin  1 application Both Eyes E2A   gabapentin  400 mg Oral Daily   insulin aspart  0-15 Units Subcutaneous TID WC   insulin aspart  0-5 Units Subcutaneous QHS   insulin aspart  5 Units Subcutaneous TID WC   insulin glargine-yfgn  30 Units Subcutaneous Daily   polyethylene glycol  17 g Oral Daily   Continuous Infusions:  doxycycline (VIBRAMYCIN) IV 100 mg (04/21/21 0917)   piperacillin-tazobactam (ZOSYN)  IV 2.25 g (04/21/21 0623)   PRN Meds:.docusate sodium, HYDROmorphone (DILAUDID) injection, methocarbamol,  oxyCODONE  Diet Orders (From admission, onward)     Start     Ordered   04/17/21 1108  Diet heart healthy/carb modified Room service  appropriate? Yes; Fluid consistency: Thin  Diet effective now       Question Answer Comment  Diet-HS Snack? Nothing   Room service appropriate? Yes   Fluid consistency: Thin      04/17/21 1107            DVT prophylaxis: SCDs Start: 04/17/21 1106     Code Status: Full Code  Family Communication: husband at bedside  Status is: Inpatient  Remains inpatient appropriate because: persistent leukocytosis, symptoms  Level of care: Telemetry Medical  Consultants:  General surgery   Microbiology  Rare staph epi, GPC, GPR, GNR  Antimicrobials: Zosyn, doxycycline   Objective: Vitals:   04/21/21 0012 04/21/21 0327 04/21/21 0600 04/21/21 0907  BP: (!) 151/98 (!) 168/107  (!) 164/93  Pulse: 91 97  93  Resp: 19 20  14   Temp: 98.1 F (36.7 C) 97.7 F (36.5 C)  98.1 F (36.7 C)  TempSrc: Oral Oral  Oral  SpO2: 100% 100%  98%  Weight:   123.7 kg   Height:        Intake/Output Summary (Last 24 hours) at 04/21/2021 0956 Last data filed at 04/21/2021 0327 Gross per 24 hour  Intake 1707.17 ml  Output --  Net 1707.17 ml    Filed Weights   04/16/21 1847 04/20/21 0500 04/21/21 0600  Weight: 94 kg 120.1 kg 123.7 kg    Examination:  Constitutional: NAD Eyes: Anicteric ENMT: Moist mucous membranes Neck: normal, supple Respiratory: No wheezing, no crackles Cardiovascular: Regular rate and rhythm, no murmurs, 1+ edema Abdomen: Soft, NT, ND, bowel sounds positive Musculoskeletal: no clubbing / cyanosis.  Skin: No rashes seen Neurologic: Nonfocal, equal strength  Data Reviewed: I have independently reviewed following labs and imaging studies   CBC: Recent Labs  Lab 04/16/21 1850 04/17/21 1322 04/18/21 0748 04/19/21 0248 04/20/21 0403 04/21/21 0304  WBC 24.2* 25.2* 22.9* 20.5* 19.2* 15.7*  NEUTROABS 18.6*  --  17.4* 13.9* 14.2*  --   HGB 11.4* 10.1* 10.6* 10.1* 9.1* 9.5*  HCT 32.4* 27.9* 31.3* 29.6* 24.9* 27.7*  MCV 78.6* 77.1* 78.8* 77.9* 76.1* 77.8*   PLT 404* 382 441* 437* 473* 462*    Basic Metabolic Panel: Recent Labs  Lab 04/16/21 1850 04/18/21 0748 04/19/21 0248 04/20/21 0403 04/21/21 0304  NA 133* 133* 134* 132* 131*  K 3.9 4.4 4.0 4.2 4.2  CL 104 101 102 104 102  CO2 20* 20* 21* 17* 19*  GLUCOSE 87 252* 135* 194* 202*  BUN 16 26* 29* 36* 36*  CREATININE 1.62* 2.96* 3.80* 4.27* 3.69*  CALCIUM 8.4* 8.1* 8.0* 8.0* 7.8*  MG  --  1.9 1.8 2.0  --     Liver Function Tests: Recent Labs  Lab 04/16/21 1850  AST 28  ALT 10  ALKPHOS 124  BILITOT 0.7  PROT 6.3*  ALBUMIN 1.8*    Coagulation Profile: Recent Labs  Lab 04/16/21 1850  INR 1.1    HbA1C: No results for input(s): HGBA1C in the last 72 hours.  CBG: Recent Labs  Lab 04/20/21 0754 04/20/21 1134 04/20/21 1635 04/20/21 2159 04/21/21 0606  GLUCAP 202* 219* 110* 195* 200*     Recent Results (from the past 240 hour(s))  Blood Culture (routine x 2)     Status: None   Collection Time: 04/16/21  7:05 PM  Specimen: BLOOD  Result Value Ref Range Status   Specimen Description BLOOD LEFT ANTECUBITAL  Final   Special Requests   Final    BOTTLES DRAWN AEROBIC AND ANAEROBIC Blood Culture results may not be optimal due to an inadequate volume of blood received in culture bottles   Culture   Final    NO GROWTH 5 DAYS Performed at [facility], [place] [address] [place], [place] [address]    Report Status 04/21/2021 FINAL  Final  Blood Culture (routine x 2)     Status: None   Collection Time: 04/16/21  7:05 PM   Specimen: BLOOD  Result Value Ref Range Status   Specimen Description BLOOD SITE NOT SPECIFIED  Final   Special Requests   Final    BOTTLES DRAWN AEROBIC AND ANAEROBIC Blood Culture results may not be optimal due to an inadequate volume of blood received in culture bottles   Culture   Final    NO GROWTH 5 DAYS Performed at [facility], [place] [address] [place], [place] [address]    Report Status 04/21/2021 FINAL  Final  Resp Panel  by RT-PCR (Flu A&B, Covid) Nasopharyngeal Swab     Status: None   Collection Time: 04/16/21  7:35 PM   Specimen: Nasopharyngeal Swab; Nasopharyngeal(NP) swabs in vial transport medium  Result Value Ref Range Status   SARS Coronavirus 2 by RT PCR NEGATIVE NEGATIVE Final    Comment: (NOTE) SARS-CoV-2 target nucleic acids are NOT DETECTED.  The SARS-CoV-2 RNA is generally detectable in upper respiratory specimens during the acute phase of infection. The lowest concentration of SARS-CoV-2 viral copies this assay can detect is 138 copies/mL. A negative result does not preclude SARS-Cov-2 infection and should not be used as the sole basis for treatment or other patient management decisions. A negative result may occur with  improper specimen collection/handling, submission of specimen other than nasopharyngeal swab, presence of viral mutation(s) within the areas targeted by this assay, and inadequate number of viral copies(<138 copies/mL). A negative result must be combined with clinical observations, patient history, and epidemiological information. The expected result is Negative.  Fact Sheet for Patients:  EntrepreneurPulse.com.au  Fact Sheet for Healthcare Providers:  IncredibleEmployment.be  This test is no t yet approved or cleared by the [place] FDA and  has been authorized for detection and/or diagnosis of SARS-CoV-2 by FDA under an Emergency Use Authorization (EUA). This EUA will remain  in effect (meaning this test can be used) for the duration of the COVID-19 declaration under Section 564(b)(1) of the Act, 21 U.S.C.section 360bbb-3(b)(1), unless the authorization is terminated  or revoked sooner.       Influenza A by PCR NEGATIVE NEGATIVE Final   Influenza B by PCR NEGATIVE NEGATIVE Final    Comment: (NOTE) The Xpert Xpress SARS-CoV-2/FLU/RSV plus assay is intended as an aid in the diagnosis of influenza from Nasopharyngeal swab  specimens and should not be used as a sole basis for treatment. Nasal washings and aspirates are unacceptable for Xpert Xpress SARS-CoV-2/FLU/RSV testing.  Fact Sheet for Patients: EntrepreneurPulse.com.au  Fact Sheet for Healthcare Providers: IncredibleEmployment.be  This test is not yet approved or cleared by the [place] FDA and has been authorized for detection and/or diagnosis of SARS-CoV-2 by FDA under an Emergency Use Authorization (EUA). This EUA will remain in effect (meaning this test can be used) for the duration of the COVID-19 declaration under Section 564(b)(1) of the Act, 21 U.S.C. section 360bbb-3(b)(1), unless the  authorization is terminated or revoked.  Performed at [facility], [place] [address] [place], [place] [address]   Aerobic/Anaerobic Culture w Gram Stain (surgical/deep wound)     Status: None (Preliminary result)   Collection Time: 04/17/21  8:43 AM   Specimen: Abscess  Result Value Ref Range Status   Specimen Description ABSCESS  Final   Special Requests NONE  Final   Gram Stain   Final    ABUNDANT WBC PRESENT, PREDOMINANTLY PMN FEW GRAM POSITIVE COCCI IN PAIRS IN CLUSTERS RARE GRAM POSITIVE RODS FEW GRAM NEGATIVE RODS Performed at [facility], [address] [place], [place] [address]    Culture   Final    RARE STAPHYLOCOCCUS EPIDERMIDIS FEW ACTINOMYCES NEUII Standardized susceptibility testing for this organism is not available. NO ANAEROBES ISOLATED; CULTURE IN PROGRESS FOR 5 DAYS    Report Status PENDING  Incomplete   Organism ID, Bacteria STAPHYLOCOCCUS EPIDERMIDIS  Final      Susceptibility   Staphylococcus epidermidis - MIC*    CIPROFLOXACIN <=0.5 SENSITIVE Sensitive     ERYTHROMYCIN >=8 RESISTANT Resistant     GENTAMICIN <=0.5 SENSITIVE Sensitive     OXACILLIN >=4 RESISTANT Resistant     TETRACYCLINE <=1 SENSITIVE Sensitive     VANCOMYCIN 2 SENSITIVE Sensitive     TRIMETH/SULFA  <=10 SENSITIVE Sensitive     CLINDAMYCIN <=0.25 SENSITIVE Sensitive     RIFAMPIN <=0.5 SENSITIVE Sensitive     Inducible Clindamycin NEGATIVE Sensitive     * RARE STAPHYLOCOCCUS EPIDERMIDIS  Aerobic/Anaerobic Culture w Gram Stain (surgical/deep wound)     Status: None (Preliminary result)   Collection Time: 04/17/21  8:47 AM   Specimen: Other Source; Body Fluid  Result Value Ref Range Status   Specimen Description FLUID  Final   Special Requests RIGHT CHEST WALL ABS SPEC B  Final   Gram Stain   Final    ABUNDANT WBC PRESENT, PREDOMINANTLY PMN MODERATE GRAM POSITIVE COCCI IN PAIRS Performed at [facility], [address] [place],  [address]    Culture   Final    FEW ACTINOMYCES NEUII Standardized susceptibility testing for this organism is not available. NO ANAEROBES ISOLATED; CULTURE IN PROGRESS FOR 5 DAYS    Report Status PENDING  Incomplete      Radiology Studies: DG Chest 1 View  Result Date: 04/20/2021 CLINICAL DATA:  Shortness of breath and dry cough. EXAM: CHEST  1 VIEW COMPARISON:  Chest x-ray and chest CT 04/16/2021 FINDINGS: The cardiac silhouette, mediastinal and hilar contours are within normal limits. Patchy left basilar density could be atelectasis or early infiltrate. No pleural effusions. IMPRESSION: Patchy left basilar density, atelectasis versus early infiltrate. Electronically Signed   By: Marijo Sanes M.D.   On: 04/20/2021 15:27   CT HIP RIGHT WO CONTRAST  Result Date: 04/20/2021 CLINICAL DATA:  Right hip pain. EXAM: CT OF THE RIGHT HIP WITHOUT CONTRAST TECHNIQUE: Multidetector CT imaging of the right hip was performed according to the standard protocol. Multiplanar CT image reconstructions were also generated. COMPARISON:  None. FINDINGS: The right hip is normally located. Mild degenerative changes but no fracture or AVN. The visualized right hemipelvic bony structures are intact. Nonspecific inflammatory changes are noted in the subcutaneous fat  surrounding the right hip and pelvis. No obvious hip joint effusion. No focal fluid collections. The hip and pelvic musculature are grossly normal by CT. No obvious muscle tear or intramuscular hematoma. No significant intrapelvic findings are identified IMPRESSION: 1.  Nonspecific inflammatory changes in the subcutaneous fat surrounding the right hip and pelvis. No focal fluid collections. 2. No acute bony findings or worrisome bone lesions. Electronically Signed   By: Marijo Sanes M.D.   On: 04/20/2021 15:24    Marzetta Board, MD, PhD [facility]  Between 7 am - 7 pm I am available, please contact me via Amion (for emergencies) or Securechat (non urgent messages)  Between 7 pm - 7 am I am not available, please contact night coverage MD/APP via Amion

## 2021-04-22 DIAGNOSIS — L0291 Cutaneous abscess, unspecified: Secondary | ICD-10-CM

## 2021-04-22 LAB — BASIC METABOLIC PANEL
Anion gap: 7 (ref 5–15)
BUN: 33 mg/dL — ABNORMAL HIGH (ref 6–20)
CO2: 24 mmol/L (ref 22–32)
Calcium: 8.3 mg/dL — ABNORMAL LOW (ref 8.9–10.3)
Chloride: 106 mmol/L (ref 98–111)
Creatinine, Ser: 2.99 mg/dL — ABNORMAL HIGH (ref 0.44–1.00)
GFR, Estimated: 19 mL/min — ABNORMAL LOW (ref >60)
Glucose, Bld: 122 mg/dL — ABNORMAL HIGH (ref 70–99)
Potassium: 3.9 mmol/L (ref 3.5–5.1)
Sodium: 137 mmol/L (ref 135–145)

## 2021-04-22 LAB — GLUCOSE, CAPILLARY
Glucose-Capillary: 119 mg/dL — ABNORMAL HIGH (ref 70–99)
Glucose-Capillary: 122 mg/dL — ABNORMAL HIGH (ref 70–99)
Glucose-Capillary: 94 mg/dL (ref 70–99)
Glucose-Capillary: 72 mg/dL (ref 70–99)

## 2021-04-22 LAB — AEROBIC/ANAEROBIC CULTURE W GRAM STAIN (SURGICAL/DEEP WOUND)

## 2021-04-22 LAB — CBC
HCT: 29.3 % — ABNORMAL LOW (ref 36.0–46.0)
Hemoglobin: 10 g/dL — ABNORMAL LOW (ref 12.0–15.0)
MCH: 26.5 pg (ref 26.0–34.0)
MCHC: 34.1 g/dL (ref 30.0–36.0)
MCV: 77.5 fL — ABNORMAL LOW (ref 80.0–100.0)
Platelets: 511 10*3/uL — ABNORMAL HIGH (ref 150–400)
RBC: 3.78 MIL/uL — ABNORMAL LOW (ref 3.87–5.11)
RDW: 14.4 % (ref 11.5–15.5)
WBC: 14.5 10*3/uL — ABNORMAL HIGH (ref 4.0–10.5)
nRBC: 0 % (ref 0.0–0.2)

## 2021-04-22 MED ORDER — DOXYCYCLINE HYCLATE 100 MG PO TABS
100.0000 mg | ORAL_TABLET | Freq: Two times a day (BID) | ORAL | Status: DC
  Administered 2021-04-22 – 2021-04-26 (×8): 100 mg via ORAL
  Filled 2021-04-22 (×8): qty 1

## 2021-04-22 MED ORDER — ONDANSETRON HCL 4 MG/2ML IJ SOLN
4.0000 mg | Freq: Four times a day (QID) | INTRAMUSCULAR | Status: DC | PRN
  Administered 2021-04-22 – 2021-04-24 (×5): 4 mg via INTRAVENOUS
  Filled 2021-04-22 (×5): qty 2

## 2021-04-22 MED ORDER — FUROSEMIDE 10 MG/ML IJ SOLN
40.0000 mg | Freq: Once | INTRAMUSCULAR | Status: AC
  Administered 2021-04-22: 11:00:00 40 mg via INTRAVENOUS
  Filled 2021-04-22: qty 4

## 2021-04-22 NOTE — Consult Note (Signed)
[facility]       Reason for Consult:Actinomyces breast infection    Referring Physician: Dr. Cruzita Lederer  Principal Problem:   Abscess Active Problems:   Poorly controlled type 2 diabetes mellitus with peripheral neuropathy (HCC)   Essential hypertension   Sepsis (HCC)   Conjunctivitis    acetaminophen  1,000 mg Oral Q6H   amLODipine  10 mg Oral Daily   gabapentin  400 mg Oral Daily   insulin aspart  0-15 Units Subcutaneous TID WC   insulin aspart  0-5 Units Subcutaneous QHS   insulin aspart  5 Units Subcutaneous TID WC   insulin glargine-yfgn  30 Units Subcutaneous Daily   polyethylene glycol  17 g Oral Daily    Recommendations: Doxycycline 100 mg bid for 1 month Transition to oral amoxicillin 500 mg three times a day after that for 2 additional months  I will transition her to oral doxycyline now to be sure she tolerates it  Assessment: She has a breast abscess requiring extensive debridement done on 12/25 by Dr. Bobbye Morton.  Copious pus noted and debrided with culture positive for methicillin-resistant Staph epidermidis and Actinomyces neuii.    Antibiotics: Day 7 total antibiotics - doxycycline + pip/tazo  HPI: Shirley [name] is a 45 y.o. female with a history of poorly controlled diabetes with a Hgb A1c of 13.5 who developed a breast abscess.  Debridement done as above and continuing wet to dry dressing.  WBC trending down to 14.5 and she has remained afebrile since her initial fever on admission.  Her main complaint is not feeling well, hip pain.  No complaints at the abscess site.  Some stomach upset.     Review of Systems:  Constitutional: negative for fevers and chills Integument/breast: negative for rash All other systems reviewed and are negative    Past Medical History:  Diagnosis Date   Abscess of tunica vaginalis    10/09- Abundant S. aureus- sensitive to all abx   Anxiety    Blood dyscrasia    CAD (coronary artery disease)  06/15/2006   s/p Subendocardial MI with PDA angioplasty(no stent) on 06/15/06 and relook  cath 06/19/06 showed patency of site. Cath 12/10- no restenosis or significant CAD progression   CVA (cerebral vascular accident) ([place]) ~ 02/2014   denies residual on 04/22/2014   CV[facility]cerebral vascular accident) [facility])    history of remote right cerebellar infarct noted on head CT at least since 10/2011   Depression    Diabetes mellitus type 2, uncontrolled, with complications    Fibromyalgia    Gastritis    Gastroparesis    secondary to poorly controlled DM, last emptying study performed 01/2010  was normal but may be falsely positive as pt was on reglan   GERD (gastroesophageal reflux disease)    Hepatitis B, chronic (HCC)    Hep BeAb+,Hep B cAb+ & Hep BsAg+ (9/06)   History of pyelonephritis    H/o GrpB Pyelonephritis (9/06) and UTI- 07/11- E.Coli, 12/10- GBS   Hyperlipidemia    Hypertension    Iron deficiency anemia    Irregular menses    Small ovarian follicles seen on TD(9/74)   MI (myocardial infarction) ([place]) 05/2006   PDA percutaneous transluminal coronary angioplasty   Migraine    "weekly" (04/22/2014)   N&V (nausea and vomiting)    Chronic. Unclear etiology with multiple admission and ED visits. CT abdomen with and without contrast (02/2011)  showed no acute process. Gastic Emptying  scan (01/2010) was normal. Ultrasound of the abdomen was within normal limits. Hepatitis B viral load was undectable. HIV NR. EGD - gastritis, Hpylori + s/p Rx   Obesity    OSA (obstructive sleep apnea)    "suppose to wear mask but I don't" (04/22/2014)   Peripheral neuropathy    Pneumonia    "this is probably the 2nd or 3rd time I've had pneumonia" (04/22/2014)   Recurrent boils    Seasonal asthma    Substance abuse ([place])    Thrombocytosis    Hem/Onc suggested 2/2 chronic hepatits and/or iron deficiency anemia    Social History   Tobacco Use   Smoking status: Former    Types: Cigarettes    Quit  date: 04/24/1996    Years since quitting: 25.0   Smokeless tobacco: Never   Tobacco comments:    quit smoking cigarettes age 55  Vaping Use   Vaping Use: Never used  Substance Use Topics   Alcohol use: No    Alcohol/week: 0.0 standard drinks    Comment: 04/22/2014 "might have a few drinks a month"   Drug use: Not Currently    Types: Marijuana, Cocaine    Comment: 04/22/2104 "quit drugs ~ 1-2 yr ago"    Family History  Problem Relation Age of Onset   Diabetes Father    Healthy Mother     Allergies  Allergen Reactions   Lisinopril Nausea Only and Swelling   Morphine And Related Itching and Swelling    Physical Exam: Constitutional: in no apparent distress  Vitals:   04/22/21 0834 04/22/21 1123  BP: (!) 179/123 (!) 161/93  Pulse: 86 84  Resp: 20 15  Temp: 97.6 F (36.4 C) (!) 97.5 F (36.4 C)  SpO2: 100% 94%   EYES: anicteric Respiratory: normal respiratory effort GI: soft Musculoskeletal: no edema Skin: negatives: no rash Neuro: non-focal  Lab Results  Component Value Date   WBC 14.5 (H) 04/22/2021   HGB 10.0 (L) 04/22/2021   HCT 29.3 (L) 04/22/2021   MCV 77.5 (L) 04/22/2021   PLT 511 (H) 04/22/2021    Lab Results  Component Value Date   CREATININE 2.99 (H) 04/22/2021   BUN 33 (H) 04/22/2021   NA 137 04/22/2021   K 3.9 04/22/2021   CL 106 04/22/2021   CO2 24 04/22/2021    Lab Results  Component Value Date   ALT 10 04/16/2021   AST 28 04/16/2021   ALKPHOS 124 04/16/2021     Microbiology: Recent Results (from the past 240 hour(s))  Blood Culture (routine x 2)     Status: None   Collection Time: 04/16/21  7:05 PM   Specimen: BLOOD  Result Value Ref Range Status   Specimen Description BLOOD LEFT ANTECUBITAL  Final   Special Requests   Final    BOTTLES DRAWN AEROBIC AND ANAEROBIC Blood Culture results may not be optimal due to an inadequate volume of blood received in culture bottles   Culture   Final    NO GROWTH 5 DAYS Performed at [facility], [address] [place], [place] [address]    Report Status 04/21/2021 FINAL  Final  Blood Culture (routine x 2)     Status: None   Collection Time: 04/16/21  7:05 PM   Specimen: BLOOD  Result Value Ref Range Status   Specimen Description BLOOD SITE NOT SPECIFIED  Final   Special Requests   Final    BOTTLES DRAWN AEROBIC AND ANAEROBIC Blood Culture  results may not be optimal due to an inadequate volume of blood received in culture bottles   Culture   Final    NO GROWTH 5 DAYS Performed at [facility], [place] [address], [place], [place] [address]    Report Status 04/21/2021 FINAL  Final  Resp Panel by RT-PCR (Flu A&B, Covid) Nasopharyngeal Swab     Status: None   Collection Time: 04/16/21  7:35 PM   Specimen: Nasopharyngeal Swab; Nasopharyngeal(NP) swabs in vial transport medium  Result Value Ref Range Status   SARS Coronavirus 2 by RT PCR NEGATIVE NEGATIVE Final    Comment: (NOTE) SARS-CoV-2 target nucleic acids are NOT DETECTED.  The SARS-CoV-2 RNA is generally detectable in upper respiratory specimens during the acute phase of infection. The lowest concentration of SARS-CoV-2 viral copies this assay can detect is 138 copies/mL. A negative result does not preclude SARS-Cov-2 infection and should not be used as the sole basis for treatment or other patient management decisions. A negative result may occur with  improper specimen collection/handling, submission of specimen other than nasopharyngeal swab, presence of viral mutation(s) within the areas targeted by this assay, and inadequate number of viral copies(<138 copies/mL). A negative result must be combined with clinical observations, patient history, and epidemiological information. The expected result is Negative.  Fact Sheet for Patients:  EntrepreneurPulse.com.au  Fact Sheet for Healthcare Providers:  IncredibleEmployment.be  This test is no t yet approved or cleared  by the [place] FDA and  has been authorized for detection and/or diagnosis of SARS-CoV-2 by FDA under an Emergency Use Authorization (EUA). This EUA will remain  in effect (meaning this test can be used) for the duration of the COVID-19 declaration under Section 564(b)(1) of the Act, 21 U.S.C.section 360bbb-3(b)(1), unless the authorization is terminated  or revoked sooner.       Influenza A by PCR NEGATIVE NEGATIVE Final   Influenza B by PCR NEGATIVE NEGATIVE Final    Comment: (NOTE) The Xpert Xpress SARS-CoV-2/FLU/RSV plus assay is intended as an aid in the diagnosis of influenza from Nasopharyngeal swab specimens and should not be used as a sole basis for treatment. Nasal washings and aspirates are unacceptable for Xpert Xpress SARS-CoV-2/FLU/RSV testing.  Fact Sheet for Patients: EntrepreneurPulse.com.au  Fact Sheet for Healthcare Providers: IncredibleEmployment.be  This test is not yet approved or cleared by the [place] FDA and has been authorized for detection and/or diagnosis of SARS-CoV-2 by FDA under an Emergency Use Authorization (EUA). This EUA will remain in effect (meaning this test can be used) for the duration of the COVID-19 declaration under Section 564(b)(1) of the Act, 21 U.S.C. section 360bbb-3(b)(1), unless the authorization is terminated or revoked.  Performed at [facility], [place] [address], [place], [place] [address]   Aerobic/Anaerobic Culture w Gram Stain (surgical/deep wound)     Status: None   Collection Time: 04/17/21  8:43 AM   Specimen: Abscess  Result Value Ref Range Status   Specimen Description ABSCESS  Final   Special Requests NONE  Final   Gram Stain   Final    ABUNDANT WBC PRESENT, PREDOMINANTLY PMN FEW GRAM POSITIVE COCCI IN PAIRS IN CLUSTERS RARE GRAM POSITIVE RODS FEW GRAM NEGATIVE RODS    Culture   Final    RARE STAPHYLOCOCCUS EPIDERMIDIS FEW ACTINOMYCES NEUII Standardized  susceptibility testing for this organism is not available. NO ANAEROBES ISOLATED Performed at [facility], [place] [address] [place], [place] [address]    Report  Status 04/22/2021 FINAL  Final   Organism ID, Bacteria STAPHYLOCOCCUS EPIDERMIDIS  Final      Susceptibility   Staphylococcus epidermidis - MIC*    CIPROFLOXACIN <=0.5 SENSITIVE Sensitive     ERYTHROMYCIN >=8 RESISTANT Resistant     GENTAMICIN <=0.5 SENSITIVE Sensitive     OXACILLIN >=4 RESISTANT Resistant     TETRACYCLINE <=1 SENSITIVE Sensitive     VANCOMYCIN 2 SENSITIVE Sensitive     TRIMETH/SULFA <=10 SENSITIVE Sensitive     CLINDAMYCIN <=0.25 SENSITIVE Sensitive     RIFAMPIN <=0.5 SENSITIVE Sensitive     Inducible Clindamycin NEGATIVE Sensitive     * RARE STAPHYLOCOCCUS EPIDERMIDIS  Aerobic/Anaerobic Culture w Gram Stain (surgical/deep wound)     Status: None   Collection Time: 04/17/21  8:47 AM   Specimen: Other Source; Body Fluid  Result Value Ref Range Status   Specimen Description FLUID  Final   Special Requests RIGHT CHEST WALL ABS SPEC B  Final   Gram Stain   Final    ABUNDANT WBC PRESENT, PREDOMINANTLY PMN MODERATE GRAM POSITIVE COCCI IN PAIRS    Culture   Final    FEW ACTINOMYCES NEUII Standardized susceptibility testing for this organism is not available. NO ANAEROBES ISOLATED Performed at [facility], [place] [address] [place], [place] [address]    Report Status 04/22/2021 FINAL  Final  MRSA Next Gen by PCR, Nasal     Status: Abnormal   Collection Time: 04/21/21  6:04 PM   Specimen: Nasal Mucosa; Nasal Swab  Result Value Ref Range Status   MRSA by PCR Next Gen DETECTED (A) NOT DETECTED Final    Comment: RESULT CALLED TO, READ BACK BY AND VERIFIED WITH: PEACH FENSKE RN 04/21/2021 @2000  BY JW Performed at [facility], [place] [address], [place], [place] [address]     Ashla Murph W Jorell Agne, [place] [facility] [email or web address] 04/22/2021, 2:08 PM

## 2021-04-22 NOTE — Progress Notes (Signed)
Physical Therapy Treatment Patient Details Name: Shirley [name] MRN: [id number] DOB: 1976/02/13 Today's Date: 04/22/2021   History of Present Illness 45 y/o female presented to ED on 12/24 for evaluation of abscess under R breast with drainage. S/p I&D R chest wall abscess on 12/25. Now complaining of R hip pain. PMH: poorly controlled insulin-dependent type 2 diabetes (A1c 12.3 a year ago), recurrent abscesses, hypertension, medical noncompliance, CKD stage IIIa, OSA with CPAP, CAD, CVA, anxiety, depression, chronic hep B, hypertension, hyperlipidemia, substance abuse    PT Comments    Pt received in supine, lethargic and agreeable to limited therapy session with max encouragement. Pt needing greatly increased time to initiate and perform mobility tasks today and reports severe nausea throughout and LLQ discomfort despite premedication. Pt needing up to minA for bed mobility and squat pivot transfers (along EOB to simulate bed>chair transfer), defer OOB for safety due to pt nausea and lethargy and unwillingness to attempt standing at RW. Pt continues to benefit from PT services to progress toward functional mobility goals. Discussed with pt and supervising PT Alayna W, updated disposition recommendation to SNF due to limited mobility progress. In order to DC home, pt would need to safely perform household distance gait trial and flight of stairs, at time of session pt unable to stand safely.   Recommendations for follow up therapy are one component of a multi-disciplinary discharge planning process, led by the attending physician.  Recommendations may be updated based on patient status, additional functional criteria and insurance authorization.  Follow Up Recommendations  Skilled nursing-short term rehab (<3 hours/day)     Assistance Recommended at Discharge Frequent or constant Supervision/Assistance  Equipment Recommendations  Rolling walker (2 wheels)    Recommendations for Other  Services       Precautions / Restrictions Precautions Precautions: Fall Restrictions Weight Bearing Restrictions: No     Mobility  Bed Mobility Overal bed mobility: Needs Assistance Bed Mobility: Supine to Sit;Sit to Supine     Supine to sit: Min guard Sit to supine: Min assist   General bed mobility comments: increased time, HOB elevated and reliant on bed rails. She needs min assist to bring LEs back on bed    Transfers Overall transfer level: Needs assistance Equipment used: None Transfers: Bed to chair/wheelchair/BSC     Squat pivot transfers: Min assist       General transfer comment: pt performed x3 squat pivots toward [facility] with up to minA for safety, pt unable to tolerate standing due to lethargy/nausea and self-limiting today.        Balance Overall balance assessment: Needs assistance Sitting-balance support: No upper extremity supported;Feet supported Sitting balance-Leahy Scale: Fair     Standing balance comment: pt refusing standing trials 2/2 nausea        Cognition Arousal/Alertness: Lethargic;Suspect due to medications Behavior During Therapy: Flat affect Overall Cognitive Status: Within Functional Limits for tasks assessed           General Comments: pt with slow processing, following some 1-step commands with delay but not responding to most of therapist questions. Pt perseverating on nausea and refusing OOB mobility after repositioning/EOB activity.           General Comments General comments (skin integrity, edema, etc.): BP 174/96 (115) seated EOB, c/o nausea in supine/long sit/EOB sitting; SpO2 poor signal but no significant respiratory distress, just lethargic      Pertinent Vitals/Pain Pain Assessment: Faces Faces Pain Scale: Hurts even more Pain Location: LLQ, R hip/breast Pain  Descriptors / Indicators: Grimacing;Sore Pain Intervention(s): Limited activity within patient's tolerance;Monitored during session;Premedicated  before session;Repositioned           PT Goals (current goals can now be found in the care plan section) Acute Rehab PT Goals Patient Stated Goal: to reduce pain/nausea PT Goal Formulation: With patient Time For Goal Achievement: 05/04/21 Progress towards PT goals: Not progressing toward goals - comment (updated disposition due to poor progress toward goals)    Frequency    Min 3X/week      PT Plan Discharge plan needs to be updated       AM-PAC PT "6 Clicks" Mobility   Outcome Measure  Help needed turning from your back to your side while in a flat bed without using bedrails?: A [place] Help needed moving from lying on your back to sitting on the side of a flat bed without using bedrails?: A Lot (mod cues today) Help needed moving to and from a bed to a chair (including a wheelchair)?: A Lot (mod cues today) Help needed standing up from a chair using your arms (e.g., wheelchair or bedside chair)?: A Lot Help needed to walk in hospital room?: Total (pt unable today) Help needed climbing 3-5 steps with a railing? : Total 6 Click Score: 11    End of Session   Activity Tolerance: Patient limited by lethargy;Other (comment) (nausea sx limiting (despite premedication with anti-nausea meds)) Patient left: in bed;with call bell/phone within reach;with bed alarm set;Other (comment) (sidelying to L side, pillow bwn knees) Nurse Communication: Mobility status;Other (comment) (requesting more anti-nausea meds) PT Visit Diagnosis: Unsteadiness on feet (R26.81);Muscle weakness (generalized) (M62.81);Pain Pain - Right/Left: Right Pain - part of body: Hip     Time: 1440-1501 PT Time Calculation (min) (ACUTE ONLY): 21 min  Charges:  $Therapeutic Activity: 8-22 mins                     Marik Sedore P., PTA Acute Rehabilitation Services Pager: [phone] Office: [phone]    [place] 04/22/2021, 3:55 PM

## 2021-04-22 NOTE — Progress Notes (Signed)
PROGRESS NOTE  Shirley [name] QVZ:[id number] DOB: 1975-11-12 DOA: 04/16/2021 PCP: Fredrich Romans, PA   LOS: 6 days   Brief Narrative / Interim history: 45 y.o. female with medical history significant of poorly controlled insulin-dependent type 2 diabetes (A1c 12.3 a year ago), recurrent abscesses, hypertension, medical noncompliance, CKD stage IIIa, OSA with CPAP, CAD, CVA, anxiety, depression, chronic hep B, hypertension, hyperlipidemia, substance abuse presented to the ED for evaluation of abscess under her right breast with drainage.  On presentation, WBC was 24.2 with creatinine of 1.62.  CT showing edema and soft tissue stranding within the right greater than left mid to lower chest wall with evidence of a 3.8 x 2.9 cm gas and fluid collection in the subcutaneous fat consistent with soft tissue abscess.  Moderate gas tracking posteriorly along the right lateral chest wall.  Soft tissue inflammatory changes extend to the right upper quadrant abdominal wall.  She was started on IV fluids/antibiotics.  General surgery was consulted.  She underwent I&D of right chest wall abscess on 04/17/2021.  Subjective / 24h Interval events: Complains of hip pain, abdominal pain, feeling poorly  Assessment & Plan: Principal Problem Sepsis due to chest wall abscess-patient was admitted to the hospital initially placed on broad-spectrum antibiotics.  She was febrile, tachycardic, tachypneic and had an elevated WBC.  Received IV fluids, general surgery consulted and she is status post I&D of right chest wall abscess on 12/25.  Currently on Zosyn, doxycycline.  Culture showing rare staph epi, few actinomyces neuii.  ID consulted today, appreciate input  Active Problems Right hip pain-CT scan of the right hip without acute findings but it did show nonspecific inflammatory changes in the subcutaneous fat surrounding the right hip and pelvis without focal fluid collections.  Persistent pain today but no clear  etiology.  Left upper abdominal swelling-no skin changes on the surface, this appears superficial.  Ultrasound shows edema.  Give Lasix today  Acute kidney injury on chronic kidney disease stage IIIa-Baseline creatinine around 1.2-1.4, creatinine still elevated but continues to improve.  Suspect multifactorial in the setting of sepsis/vancomycin use.  No other significant nephrotoxins.  Renal ultrasound without hydronephrosis, normal bladder.  She is significantly fluid overloaded, will attempt Lasix x1 today  Intermittent shortness of breath-chest x-ray done shows atelectasis versus early infiltrate.  On antibiotics.  Hyponatremia -Mild.  Monitor   Left adnexal lesion -CT showing a 3.8 cm low density indeterminate left adnexal lesion. Will need nonemergent outpatient pelvic ultrasound for further evaluation.   Bacterial conjunctivitis -Continue erythromycin ointment   Poorly controlled insulin-dependent type 2 diabetes, with hyperglycemia - A1c is 13.5. Continue long-acting insulin along with CBGs with SSI  CBG (last 3)  Recent Labs    04/21/21 1702 04/21/21 2141 04/22/21 0618  GLUCAP 155* 74 119*    Hypertension -Continue amlodipine   Diabetic neuropathy -Continue gabapentin   OSA -Continue nightly CPAP   CAD -No anginal symptoms.  Scheduled Meds:  acetaminophen  1,000 mg Oral Q6H   amLODipine  10 mg Oral Daily   gabapentin  400 mg Oral Daily   insulin aspart  0-15 Units Subcutaneous TID WC   insulin aspart  0-5 Units Subcutaneous QHS   insulin aspart  5 Units Subcutaneous TID WC   insulin glargine-yfgn  30 Units Subcutaneous Daily   polyethylene glycol  17 g Oral Daily   Continuous Infusions:  doxycycline (VIBRAMYCIN) IV 100 mg (04/22/21 0846)   piperacillin-tazobactam (ZOSYN)  IV 3.375 g (04/22/21 0547)   PRN  Meds:.docusate sodium, HYDROmorphone (DILAUDID) injection, methocarbamol, ondansetron (ZOFRAN) IV, oxyCODONE  Diet Orders (From admission, onward)      Start     Ordered   04/17/21 1108  Diet heart healthy/carb modified Room service appropriate? Yes; Fluid consistency: Thin  Diet effective now       Question Answer Comment  Diet-HS Snack? Nothing   Room service appropriate? Yes   Fluid consistency: Thin      04/17/21 1107            DVT prophylaxis: SCDs Start: 04/17/21 1106     Code Status: Full Code  Family Communication: husband at bedside  Status is: Inpatient  Remains inpatient appropriate because: persistent leukocytosis, symptoms  Level of care: Telemetry Medical  Consultants:  General surgery   Microbiology  Rare staph epi, GPC, GPR, GNR  Antimicrobials: Zosyn, doxycycline   Objective: Vitals:   04/21/21 2345 04/22/21 0429 04/22/21 0834 04/22/21 1123  BP: (!) 171/99 (!) 143/86 (!) 179/123 (!) 161/93  Pulse: 90 87 86 84  Resp: 18 17 20 15   Temp: 97.6 F (36.4 C) 97.7 F (36.5 C) 97.6 F (36.4 C) (!) 97.5 F (36.4 C)  TempSrc: Oral Oral Oral Oral  SpO2: 100% 100% 100% 94%  Weight:      Height:        Intake/Output Summary (Last 24 hours) at 04/22/2021 1149 Last data filed at 04/21/2021 9702 Gross per 24 hour  Intake 316.72 ml  Output --  Net 316.72 ml    Filed Weights   04/16/21 1847 04/20/21 0500 04/21/21 0600  Weight: 94 kg 120.1 kg 123.7 kg    Examination:  Constitutional: No distress, at the edge of the bed Eyes: No scleral icterus ENMT: Moist mucous membranes Neck: normal, supple Respiratory: Clear without wheezing or crackles Cardiovascular: Regular rate and rhythm, no murmurs, 1+ edema Abdomen: Soft, NT, ND, positive bowel sounds Musculoskeletal: no clubbing / cyanosis.  Skin: No rashes seen Neurologic: No focal deficits  Data Reviewed: I have independently reviewed following labs and imaging studies   CBC: Recent Labs  Lab 04/16/21 1850 04/17/21 1322 04/18/21 0748 04/19/21 0248 04/20/21 0403 04/21/21 0304 04/22/21 0628  WBC 24.2*   < > 22.9* 20.5* 19.2* 15.7*  14.5*  NEUTROABS 18.6*  --  17.4* 13.9* 14.2*  --   --   HGB 11.4*   < > 10.6* 10.1* 9.1* 9.5* 10.0*  HCT 32.4*   < > 31.3* 29.6* 24.9* 27.7* 29.3*  MCV 78.6*   < > 78.8* 77.9* 76.1* 77.8* 77.5*  PLT 404*   < > 441* 437* 473* 462* 511*   < > = values in this interval not displayed.    Basic Metabolic Panel: Recent Labs  Lab 04/18/21 0748 04/19/21 0248 04/20/21 0403 04/21/21 0304 04/22/21 0628  NA 133* 134* 132* 131* 137  K 4.4 4.0 4.2 4.2 3.9  CL 101 102 104 102 106  CO2 20* 21* 17* 19* 24  GLUCOSE 252* 135* 194* 202* 122*  BUN 26* 29* 36* 36* 33*  CREATININE 2.96* 3.80* 4.27* 3.69* 2.99*  CALCIUM 8.1* 8.0* 8.0* 7.8* 8.3*  MG 1.9 1.8 2.0  --   --     Liver Function Tests: Recent Labs  Lab 04/16/21 1850  AST 28  ALT 10  ALKPHOS 124  BILITOT 0.7  PROT 6.3*  ALBUMIN 1.8*    Coagulation Profile: Recent Labs  Lab 04/16/21 1850  INR 1.1    HbA1C: No results for input(s): HGBA1C in  the last 72 hours.  CBG: Recent Labs  Lab 04/21/21 0606 04/21/21 1105 04/21/21 1702 04/21/21 2141 04/22/21 0618  GLUCAP 200* 110* 155* 74 119*     Recent Results (from the past 240 hour(s))  Blood Culture (routine x 2)     Status: None   Collection Time: 04/16/21  7:05 PM   Specimen: BLOOD  Result Value Ref Range Status   Specimen Description BLOOD LEFT ANTECUBITAL  Final   Special Requests   Final    BOTTLES DRAWN AEROBIC AND ANAEROBIC Blood Culture results may not be optimal due to an inadequate volume of blood received in culture bottles   Culture   Final    NO GROWTH 5 DAYS Performed at [facility], [place] [address], [place], [place] [address]    Report Status 04/21/2021 FINAL  Final  Blood Culture (routine x 2)     Status: None   Collection Time: 04/16/21  7:05 PM   Specimen: BLOOD  Result Value Ref Range Status   Specimen Description BLOOD SITE NOT SPECIFIED  Final   Special Requests   Final    BOTTLES DRAWN AEROBIC AND ANAEROBIC Blood Culture results may  not be optimal due to an inadequate volume of blood received in culture bottles   Culture   Final    NO GROWTH 5 DAYS Performed at [facility], [place] [address], [place], [place] [address]    Report Status 04/21/2021 FINAL  Final  Resp Panel by RT-PCR (Flu A&B, Covid) Nasopharyngeal Swab     Status: None   Collection Time: 04/16/21  7:35 PM   Specimen: Nasopharyngeal Swab; Nasopharyngeal(NP) swabs in vial transport medium  Result Value Ref Range Status   SARS Coronavirus 2 by RT PCR NEGATIVE NEGATIVE Final    Comment: (NOTE) SARS-CoV-2 target nucleic acids are NOT DETECTED.  The SARS-CoV-2 RNA is generally detectable in upper respiratory specimens during the acute phase of infection. The lowest concentration of SARS-CoV-2 viral copies this assay can detect is 138 copies/mL. A negative result does not preclude SARS-Cov-2 infection and should not be used as the sole basis for treatment or other patient management decisions. A negative result may occur with  improper specimen collection/handling, submission of specimen other than nasopharyngeal swab, presence of viral mutation(s) within the areas targeted by this assay, and inadequate number of viral copies(<138 copies/mL). A negative result must be combined with clinical observations, patient history, and epidemiological information. The expected result is Negative.  Fact Sheet for Patients:  EntrepreneurPulse.com.au  Fact Sheet for Healthcare Providers:  IncredibleEmployment.be  This test is no t yet approved or cleared by the [place] FDA and  has been authorized for detection and/or diagnosis of SARS-CoV-2 by FDA under an Emergency Use Authorization (EUA). This EUA will remain  in effect (meaning this test can be used) for the duration of the COVID-19 declaration under Section 564(b)(1) of the Act, 21 U.S.C.section 360bbb-3(b)(1), unless the authorization is terminated  or revoked  sooner.       Influenza A by PCR NEGATIVE NEGATIVE Final   Influenza B by PCR NEGATIVE NEGATIVE Final    Comment: (NOTE) The Xpert Xpress SARS-CoV-2/FLU/RSV plus assay is intended as an aid in the diagnosis of influenza from Nasopharyngeal swab specimens and should not be used as a sole basis for treatment. Nasal washings and aspirates are unacceptable for Xpert Xpress SARS-CoV-2/FLU/RSV testing.  Fact Sheet for Patients: EntrepreneurPulse.com.au  Fact Sheet for Healthcare Providers: IncredibleEmployment.be  This test  is not yet approved or cleared by the [place] and has been authorized for detection and/or diagnosis of SARS-CoV-2 by FDA under an Emergency Use Authorization (EUA). This EUA will remain in effect (meaning this test can be used) for the duration of the COVID-19 declaration under Section 564(b)(1) of the Act, 21 U.S.C. section 360bbb-3(b)(1), unless the authorization is terminated or revoked.  Performed at [facility], [place] [address] [place], [place] [address]   Aerobic/Anaerobic Culture w Gram Stain (surgical/deep wound)     Status: None   Collection Time: 04/17/21  8:43 AM   Specimen: Abscess  Result Value Ref Range Status   Specimen Description ABSCESS  Final   Special Requests NONE  Final   Gram Stain   Final    ABUNDANT WBC PRESENT, PREDOMINANTLY PMN FEW GRAM POSITIVE COCCI IN PAIRS IN CLUSTERS RARE GRAM POSITIVE RODS FEW GRAM NEGATIVE RODS    Culture   Final    RARE STAPHYLOCOCCUS EPIDERMIDIS FEW ACTINOMYCES NEUII Standardized susceptibility testing for this organism is not available. NO ANAEROBES ISOLATED Performed at [facility], [place] [address] [place], [place] [address]    Report Status 04/22/2021 FINAL  Final   Organism ID, Bacteria STAPHYLOCOCCUS EPIDERMIDIS  Final      Susceptibility   Staphylococcus epidermidis - MIC*    CIPROFLOXACIN <=0.5 SENSITIVE Sensitive     ERYTHROMYCIN  >=8 RESISTANT Resistant     GENTAMICIN <=0.5 SENSITIVE Sensitive     OXACILLIN >=4 RESISTANT Resistant     TETRACYCLINE <=1 SENSITIVE Sensitive     VANCOMYCIN 2 SENSITIVE Sensitive     TRIMETH/SULFA <=10 SENSITIVE Sensitive     CLINDAMYCIN <=0.25 SENSITIVE Sensitive     RIFAMPIN <=0.5 SENSITIVE Sensitive     Inducible Clindamycin NEGATIVE Sensitive     * RARE STAPHYLOCOCCUS EPIDERMIDIS  Aerobic/Anaerobic Culture w Gram Stain (surgical/deep wound)     Status: None   Collection Time: 04/17/21  8:47 AM   Specimen: Other Source; Body Fluid  Result Value Ref Range Status   Specimen Description FLUID  Final   Special Requests RIGHT CHEST WALL ABS SPEC B  Final   Gram Stain   Final    ABUNDANT WBC PRESENT, PREDOMINANTLY PMN MODERATE GRAM POSITIVE COCCI IN PAIRS    Culture   Final    FEW ACTINOMYCES NEUII Standardized susceptibility testing for this organism is not available. NO ANAEROBES ISOLATED Performed at [facility], [place] [address] [place], [place] [address]    Report Status 04/22/2021 FINAL  Final  MRSA Next Gen by PCR, Nasal     Status: Abnormal   Collection Time: 04/21/21  6:04 PM   Specimen: Nasal Mucosa; Nasal Swab  Result Value Ref Range Status   MRSA by PCR Next Gen DETECTED (A) NOT DETECTED Final    Comment: RESULT CALLED TO, READ BACK BY AND VERIFIED WITH: PEACH FENSKE RN 04/21/2021 @2000  BY JW Performed at [facility], [place] [address] [place], [place] [address]       Radiology Studies: [place] CHEST SOFT TISSUE  Result Date: 04/21/2021 CLINICAL DATA:  Superficial soft tissue swelling in the left upper abdomen EXAM: LIMITED ULTRASOUND OF ABDOMINAL SOFT TISSUES TECHNIQUE: Ultrasound examination of the abdominal wall soft tissues was performed in the area of clinical concern. COMPARISON:  None. FINDINGS: Ultrasound images limited to the area of abnormality in the superficial subcutaneous tissues of the left upper quadrant. In the area of palpable swelling,  edematous changes  are demonstrated in the subcutaneous fatty tissues. Suggestion of possible fat herniation through a small defect in the fascia. No loculated collections are seen. IMPRESSION: Area of superficial soft tissue swelling in the left upper abdomen appears to correspond to fat herniation and edema. No loculated collections. Electronically Signed   By: Lucienne Capers M.D.   On: 04/21/2021 20:10    Marzetta Board, MD, PhD [facility]  Between 7 am - 7 pm I am available, please contact me via Amion (for emergencies) or Securechat (non urgent messages)  Between 7 pm - 7 am I am not available, please contact night coverage MD/APP via Amion

## 2021-04-22 NOTE — Progress Notes (Signed)
5 Days Post-Op   Subjective/Chief Complaint: Still complains of pain in multiple areas of the body Tolerating dressing changes   Objective: Vital signs in last 24 hours: Temp:  [97.5 F (36.4 C)-98.1 F (36.7 C)] 97.7 F (36.5 C) (12/30 0429) Pulse Rate:  [83-94] 87 (12/30 0429) Resp:  [12-18] 17 (12/30 0429) BP: (131-176)/(83-103) 143/86 (12/30 0429) SpO2:  [98 %-100 %] 100 % (12/30 0429) Last BM Date: 04/21/21  Intake/Output from previous day: 12/29 0701 - 12/30 0700 In: 316.7 [IV Piggyback:316.7] Out: -  Intake/Output this shift: No intake/output data recorded.  Exam: Awake and alert RUQ abscess site clean, induration decreasing Area of right hip/thigh and LUQ are unremarkable  Lab Results:  Recent Labs    04/21/21 0304 04/22/21 0628  WBC 15.7* 14.5*  HGB 9.5* 10.0*  HCT 27.7* 29.3*  PLT 462* 511*   BMET Recent Labs    04/21/21 0304 04/22/21 0628  NA 131* 137  K 4.2 3.9  CL 102 106  CO2 19* 24  GLUCOSE 202* 122*  BUN 36* 33*  CREATININE 3.69* 2.99*  CALCIUM 7.8* 8.3*   PT/INR No results for input(s): LABPROT, INR in the last 72 hours. ABG No results for input(s): PHART, HCO3 in the last 72 hours.  Invalid input(s): PCO2, PO2  Studies/Results: DG Chest 1 View  Result Date: 04/20/2021 CLINICAL DATA:  Shortness of breath and dry cough. EXAM: CHEST  1 VIEW COMPARISON:  Chest x-ray and chest CT 04/16/2021 FINDINGS: The cardiac silhouette, mediastinal and hilar contours are within normal limits. Patchy left basilar density could be atelectasis or early infiltrate. No pleural effusions. IMPRESSION: Patchy left basilar density, atelectasis versus early infiltrate. Electronically Signed   By: Marijo Sanes M.D.   On: 04/20/2021 15:27   CT HIP RIGHT WO CONTRAST  Result Date: 04/20/2021 CLINICAL DATA:  Right hip pain. EXAM: CT OF THE RIGHT HIP WITHOUT CONTRAST TECHNIQUE: Multidetector CT imaging of the right hip was performed according to the standard  protocol. Multiplanar CT image reconstructions were also generated. COMPARISON:  None. FINDINGS: The right hip is normally located. Mild degenerative changes but no fracture or AVN. The visualized right hemipelvic bony structures are intact. Nonspecific inflammatory changes are noted in the subcutaneous fat surrounding the right hip and pelvis. No obvious hip joint effusion. No focal fluid collections. The hip and pelvic musculature are grossly normal by CT. No obvious muscle tear or intramuscular hematoma. No significant intrapelvic findings are identified IMPRESSION: 1. Nonspecific inflammatory changes in the subcutaneous fat surrounding the right hip and pelvis. No focal fluid collections. 2. No acute bony findings or worrisome bone lesions. Electronically Signed   By: Marijo Sanes M.D.   On: 04/20/2021 15:24   [place] CHEST SOFT TISSUE  Result Date: 04/21/2021 CLINICAL DATA:  Superficial soft tissue swelling in the left upper abdomen EXAM: LIMITED ULTRASOUND OF ABDOMINAL SOFT TISSUES TECHNIQUE: Ultrasound examination of the abdominal wall soft tissues was performed in the area of clinical concern. COMPARISON:  None. FINDINGS: Ultrasound images limited to the area of abnormality in the superficial subcutaneous tissues of the left upper quadrant. In the area of palpable swelling, edematous changes are demonstrated in the subcutaneous fatty tissues. Suggestion of possible fat herniation through a small defect in the fascia. No loculated collections are seen. IMPRESSION: Area of superficial soft tissue swelling in the left upper abdomen appears to correspond to fat herniation and edema. No loculated collections. Electronically Signed   By: Lucienne Capers M.D.   On:  04/21/2021 20:10    Anti-infectives: Anti-infectives (From admission, onward)    Start     Dose/Rate Route Frequency Ordered Stop   04/21/21 1400  piperacillin-tazobactam (ZOSYN) IVPB 3.375 g        3.375 g 12.5 mL/hr over 240 Minutes  Intravenous Every 8 hours 04/21/21 1243     04/19/21 2200  piperacillin-tazobactam (ZOSYN) IVPB 2.25 g  Status:  Discontinued        2.25 g 100 mL/hr over 30 Minutes Intravenous Every 8 hours 04/19/21 1840 04/21/21 1243   04/19/21 0815  doxycycline (VIBRAMYCIN) 100 mg in sodium chloride 0.9 % 250 mL IVPB        100 mg 125 mL/hr over 120 Minutes Intravenous Every 12 hours 04/19/21 0729     04/18/21 1000  Vancomycin (VANCOCIN) 1,250 mg in sodium chloride 0.9 % 250 mL IVPB  Status:  Discontinued        1,250 mg 166.7 mL/hr over 90 Minutes Intravenous Every 36 hours 04/16/21 2101 04/18/21 0127   04/18/21 1000  vancomycin (VANCOREADY) IVPB 1250 mg/250 mL  Status:  Discontinued        1,250 mg 166.7 mL/hr over 90 Minutes Intravenous Every 36 hours 04/18/21 0127 04/18/21 1103   04/17/21 0600  piperacillin-tazobactam (ZOSYN) IVPB 3.375 g  Status:  Discontinued        3.375 g 12.5 mL/hr over 240 Minutes Intravenous Every 8 hours 04/16/21 2059 04/19/21 1840   04/16/21 2000  vancomycin (VANCOCIN) 1,750 mg in sodium chloride 0.9 % 500 mL IVPB        1,750 mg 250 mL/hr over 120 Minutes Intravenous  Once 04/16/21 1947 04/17/21 0038   04/16/21 2000  piperacillin-tazobactam (ZOSYN) IVPB 3.375 g        3.375 g 100 mL/hr over 30 Minutes Intravenous  Once 04/16/21 1947 04/16/21 2059       Assessment/Plan:  POD #5 s/p I&D of R chest wall abscess by Dr. Bobbye Morton - 04/17/2021   Continue wound care and antibiotics.  WBC down further today No other abscess sites found   Nothing further to offer from surgical standpoint.  Will sign off for now.  Continue bid wet to dry dressing changes at discharge and follow up with our office in 2 to 3 weeks after going home.     Coralie Keens MD 04/22/2021

## 2021-04-23 LAB — GLUCOSE, CAPILLARY
Glucose-Capillary: 76 mg/dL (ref 70–99)
Glucose-Capillary: 78 mg/dL (ref 70–99)
Glucose-Capillary: 96 mg/dL (ref 70–99)
Glucose-Capillary: 155 mg/dL — ABNORMAL HIGH (ref 70–99)
Glucose-Capillary: 155 mg/dL — ABNORMAL HIGH (ref 70–99)

## 2021-04-23 LAB — BASIC METABOLIC PANEL
Anion gap: 9 (ref 5–15)
BUN: 31 mg/dL — ABNORMAL HIGH (ref 6–20)
CO2: 24 mmol/L (ref 22–32)
Calcium: 8.8 mg/dL — ABNORMAL LOW (ref 8.9–10.3)
Chloride: 106 mmol/L (ref 98–111)
Creatinine, Ser: 2.62 mg/dL — ABNORMAL HIGH (ref 0.44–1.00)
GFR, Estimated: 22 mL/min — ABNORMAL LOW (ref >60)
Glucose, Bld: 75 mg/dL (ref 70–99)
Potassium: 4.3 mmol/L (ref 3.5–5.1)
Sodium: 139 mmol/L (ref 135–145)

## 2021-04-23 LAB — CBC
HCT: 30.3 % — ABNORMAL LOW (ref 36.0–46.0)
Hemoglobin: 10.6 g/dL — ABNORMAL LOW (ref 12.0–15.0)
MCH: 27.2 pg (ref 26.0–34.0)
MCHC: 35 g/dL (ref 30.0–36.0)
MCV: 77.9 fL — ABNORMAL LOW (ref 80.0–100.0)
Platelets: 536 10*3/uL — ABNORMAL HIGH (ref 150–400)
RBC: 3.89 MIL/uL (ref 3.87–5.11)
RDW: 14.6 % (ref 11.5–15.5)
WBC: 15.4 10*3/uL — ABNORMAL HIGH (ref 4.0–10.5)
nRBC: 0 % (ref 0.0–0.2)

## 2021-04-23 MED ORDER — FUROSEMIDE 10 MG/ML IJ SOLN
40.0000 mg | Freq: Once | INTRAMUSCULAR | Status: AC
  Administered 2021-04-23: 40 mg via INTRAVENOUS
  Filled 2021-04-23: qty 4

## 2021-04-23 NOTE — Social Work (Signed)
CSW attempted to complete SNF assessment with pt at bedside. Pt lethargic and requesting for CSW to come back later. CSW will follow up at a later time. TOC will continue to follow for DC needs.

## 2021-04-23 NOTE — Progress Notes (Addendum)
PROGRESS NOTE  Shirley [name] FBP:[id number] DOB: [birth year] DOA: 04/16/2021 PCP: Fredrich Romans, PA   LOS: 7 days   Brief Narrative / Interim history: 45 y.o. female with medical history significant of poorly controlled insulin-dependent type 2 diabetes (A1c 12.3 a year ago), recurrent abscesses, hypertension, medical noncompliance, CKD stage IIIa, OSA with CPAP, CAD, CVA, anxiety, depression, chronic hep B, hypertension, hyperlipidemia, substance abuse presented to the ED for evaluation of abscess under her right breast with drainage.  On presentation, WBC was 24.2 with creatinine of 1.62.  CT showing edema and soft tissue stranding within the right greater than left mid to lower chest wall with evidence of a 3.8 x 2.9 cm gas and fluid collection in the subcutaneous fat consistent with soft tissue abscess.  Moderate gas tracking posteriorly along the right lateral chest wall.  Soft tissue inflammatory changes extend to the right upper quadrant abdominal wall.  She was started on IV fluids/antibiotics.  General surgery was consulted.  She underwent I&D of right chest wall abscess on 04/17/2021.  Subjective / 24h Interval events: States that she does not feel good this morning.  Left abdominal wall is bothering her.  She is also feeling dizzy today and nauseous.  Assessment & Plan: Principal Problem Sepsis due to chest wall abscess-patient was admitted to the hospital initially placed on broad-spectrum antibiotics.  She was febrile, tachycardic, tachypneic and had an elevated WBC.  Received IV fluids, general surgery consulted and she is status post I&D of right chest wall abscess on 12/25.  Cultures showing rare staph epi, few actinomyces neuii.  ID consulted and recommending a month of doxycycline followed by 2 months of amoxicillin.  Sepsis physiology improving however she still has persistent WBC elevation  Active Problems Acute kidney injury on chronic kidney disease stage IIIa-Baseline  creatinine around 1.2-1.4, creatinine still elevated.  It was believed that she developed acute kidney injury due to vancomycin which has been discontinued.  She also received fluids on admission for her sepsis resulting in significant fluid overload.  She was started on IV Lasix 12/30, responding well, creatinine improving and swelling is coming down but remains fluid overloaded.  Reassess Lasix needs on a daily basis  Right hip pain-CT scan of the right hip without acute findings but it did show nonspecific inflammatory changes in the subcutaneous fat surrounding the right hip and pelvis without focal fluid collections.  She has persistent pain in that area, unclear etiology.  Conservative management, PT  Left upper abdominal swelling-no skin changes on the surface, this appears superficial.  Ultrasound shows edema.  Continue Lasix.  She continues to have pain somewhat out of proportion with imaging findings  Intermittent shortness of breath-chest x-ray done shows atelectasis versus early infiltrate.  On antibiotics.  Continue diuresis as well  Hyponatremia -Mild.  Monitor   Left adnexal lesion -CT showing a 3.8 cm low density indeterminate left adnexal lesion. Will need nonemergent outpatient pelvic ultrasound for further evaluation.   Bacterial conjunctivitis -Continue erythromycin ointment   Poorly controlled insulin-dependent type 2 diabetes, with hyperglycemia - A1c is 13.5. Continue long-acting insulin along with CBGs with SSI  CBG (last 3)  Recent Labs    04/22/21 1545 04/22/21 2146 04/23/21 0607  GLUCAP 94 72 76    Hypertension -Continue amlodipine   Diabetic neuropathy -Continue gabapentin   OSA -Continue nightly CPAP   CAD -No anginal symptoms.  Scheduled Meds:  acetaminophen  1,000 mg Oral Q6H   amLODipine  10 mg Oral Daily  doxycycline  100 mg Oral Q12H   gabapentin  400 mg Oral Daily   insulin aspart  0-15 Units Subcutaneous TID WC   insulin aspart  0-5 Units  Subcutaneous QHS   insulin aspart  5 Units Subcutaneous TID WC   insulin glargine-yfgn  30 Units Subcutaneous Daily   polyethylene glycol  17 g Oral Daily   Continuous Infusions:   PRN Meds:.docusate sodium, HYDROmorphone (DILAUDID) injection, methocarbamol, ondansetron (ZOFRAN) IV, oxyCODONE  Diet Orders (From admission, onward)     Start     Ordered   04/17/21 1108  Diet heart healthy/carb modified Room service appropriate? Yes; Fluid consistency: Thin  Diet effective now       Question Answer Comment  Diet-HS Snack? Nothing   Room service appropriate? Yes   Fluid consistency: Thin      04/17/21 1107            DVT prophylaxis: SCDs Start: 04/17/21 1106     Code Status: Full Code  Family Communication: husband at bedside  Status is: Inpatient  Remains inpatient appropriate because: persistent leukocytosis, symptoms  Level of care: Telemetry Medical  Consultants:  General surgery   Microbiology  Rare staph epi, GPC, GPR, GNR  Antimicrobials: Zosyn, doxycycline   Objective: Vitals:   04/23/21 0409 04/23/21 0420 04/23/21 0913 04/23/21 0935  BP: (!) 137/100  (!) 168/106   Pulse: 88  90   Resp: 16 18 (!) 23 14  Temp: 97.7 F (36.5 C)  97.6 F (36.4 C)   TempSrc: Oral  Oral   SpO2: 100%  99%   Weight:      Height:        Intake/Output Summary (Last 24 hours) at 04/23/2021 1118 Last data filed at 04/23/2021 0700 Gross per 24 hour  Intake 480 ml  Output --  Net 480 ml    Filed Weights   04/16/21 1847 04/20/21 0500 04/21/21 0600  Weight: 94 kg 120.1 kg 123.7 kg    Examination:  Constitutional: Appears uncomfortable, rocking back and forth Eyes: Anicteric ENMT: Moist mucous membranes Neck: normal, supple Respiratory: Clear bilaterally, no wheezing, no crackles Cardiovascular: Regular rate and rhythm, no murmurs, 1+ pitting edema Abdomen: Soft, nontender, nondistended, bowel sounds positive Musculoskeletal: no clubbing / cyanosis.  Skin:  No rashes appreciated Neurologic: Nonfocal, equal strength  Data Reviewed: I have independently reviewed following labs and imaging studies   CBC: Recent Labs  Lab 04/16/21 1850 04/17/21 1322 04/18/21 0748 04/19/21 0248 04/20/21 0403 04/21/21 0304 04/22/21 0628 04/23/21 0155  WBC 24.2*   < > 22.9* 20.5* 19.2* 15.7* 14.5* 15.4*  NEUTROABS 18.6*  --  17.4* 13.9* 14.2*  --   --   --   HGB 11.4*   < > 10.6* 10.1* 9.1* 9.5* 10.0* 10.6*  HCT 32.4*   < > 31.3* 29.6* 24.9* 27.7* 29.3* 30.3*  MCV 78.6*   < > 78.8* 77.9* 76.1* 77.8* 77.5* 77.9*  PLT 404*   < > 441* 437* 473* 462* 511* 536*   < > = values in this interval not displayed.    Basic Metabolic Panel: Recent Labs  Lab 04/18/21 0748 04/19/21 0248 04/20/21 0403 04/21/21 0304 04/22/21 0628 04/23/21 0155  NA 133* 134* 132* 131* 137 139  K 4.4 4.0 4.2 4.2 3.9 4.3  CL 101 102 104 102 106 106  CO2 20* 21* 17* 19* 24 24  GLUCOSE 252* 135* 194* 202* 122* 75  BUN 26* 29* 36* 36* 33* 31*  CREATININE 2.96*  3.80* 4.27* 3.69* 2.99* 2.62*  CALCIUM 8.1* 8.0* 8.0* 7.8* 8.3* 8.8*  MG 1.9 1.8 2.0  --   --   --     Liver Function Tests: Recent Labs  Lab 04/16/21 1850  AST 28  ALT 10  ALKPHOS 124  BILITOT 0.7  PROT 6.3*  ALBUMIN 1.8*    Coagulation Profile: Recent Labs  Lab 04/16/21 1850  INR 1.1    HbA1C: No results for input(s): HGBA1C in the last 72 hours.  CBG: Recent Labs  Lab 04/22/21 0618 04/22/21 1301 04/22/21 1545 04/22/21 2146 04/23/21 0607  GLUCAP 119* 122* 94 72 76     Recent Results (from the past 240 hour(s))  Blood Culture (routine x 2)     Status: None   Collection Time: 04/16/21  7:05 PM   Specimen: BLOOD  Result Value Ref Range Status   Specimen Description BLOOD LEFT ANTECUBITAL  Final   Special Requests   Final    BOTTLES DRAWN AEROBIC AND ANAEROBIC Blood Culture results may not be optimal due to an inadequate volume of blood received in culture bottles   Culture   Final    NO  GROWTH 5 DAYS Performed at [facility], [place] [address], [place], [place] [address]    Report Status 04/21/2021 FINAL  Final  Blood Culture (routine x 2)     Status: None   Collection Time: 04/16/21  7:05 PM   Specimen: BLOOD  Result Value Ref Range Status   Specimen Description BLOOD SITE NOT SPECIFIED  Final   Special Requests   Final    BOTTLES DRAWN AEROBIC AND ANAEROBIC Blood Culture results may not be optimal due to an inadequate volume of blood received in culture bottles   Culture   Final    NO GROWTH 5 DAYS Performed at [facility], [place] [address], [place], [place] [address]    Report Status 04/21/2021 FINAL  Final  Resp Panel by RT-PCR (Flu A&B, Covid) Nasopharyngeal Swab     Status: None   Collection Time: 04/16/21  7:35 PM   Specimen: Nasopharyngeal Swab; Nasopharyngeal(NP) swabs in vial transport medium  Result Value Ref Range Status   SARS Coronavirus 2 by RT PCR NEGATIVE NEGATIVE Final    Comment: (NOTE) SARS-CoV-2 target nucleic acids are NOT DETECTED.  The SARS-CoV-2 RNA is generally detectable in upper respiratory specimens during the acute phase of infection. The lowest concentration of SARS-CoV-2 viral copies this assay can detect is 138 copies/mL. A negative result does not preclude SARS-Cov-2 infection and should not be used as the sole basis for treatment or other patient management decisions. A negative result may occur with  improper specimen collection/handling, submission of specimen other than nasopharyngeal swab, presence of viral mutation(s) within the areas targeted by this assay, and inadequate number of viral copies(<138 copies/mL). A negative result must be combined with clinical observations, patient history, and epidemiological information. The expected result is Negative.  Fact Sheet for Patients:  EntrepreneurPulse.com.au  Fact Sheet for Healthcare Providers:  IncredibleEmployment.be  This  test is no t yet approved or cleared by the [place] FDA and  has been authorized for detection and/or diagnosis of SARS-CoV-2 by FDA under an Emergency Use Authorization (EUA). This EUA will remain  in effect (meaning this test can be used) for the duration of the COVID-19 declaration under Section 564(b)(1) of the Act, 21 U.S.C.section 360bbb-3(b)(1), unless the authorization is terminated  or revoked sooner.  Influenza A by PCR NEGATIVE NEGATIVE Final   Influenza B by PCR NEGATIVE NEGATIVE Final    Comment: (NOTE) The Xpert Xpress SARS-CoV-2/FLU/RSV plus assay is intended as an aid in the diagnosis of influenza from Nasopharyngeal swab specimens and should not be used as a sole basis for treatment. Nasal washings and aspirates are unacceptable for Xpert Xpress SARS-CoV-2/FLU/RSV testing.  Fact Sheet for Patients: EntrepreneurPulse.com.au  Fact Sheet for Healthcare Providers: IncredibleEmployment.be  This test is not yet approved or cleared by the [place] FDA and has been authorized for detection and/or diagnosis of SARS-CoV-2 by FDA under an Emergency Use Authorization (EUA). This EUA will remain in effect (meaning this test can be used) for the duration of the COVID-19 declaration under Section 564(b)(1) of the Act, 21 U.S.C. section 360bbb-3(b)(1), unless the authorization is terminated or revoked.  Performed at [facility], [place] [address], [place], [place] [address]   Aerobic/Anaerobic Culture w Gram Stain (surgical/deep wound)     Status: None   Collection Time: 04/17/21  8:43 AM   Specimen: Abscess  Result Value Ref Range Status   Specimen Description ABSCESS  Final   Special Requests NONE  Final   Gram Stain   Final    ABUNDANT WBC PRESENT, PREDOMINANTLY PMN FEW GRAM POSITIVE COCCI IN PAIRS IN CLUSTERS RARE GRAM POSITIVE RODS FEW GRAM NEGATIVE RODS    Culture   Final    RARE STAPHYLOCOCCUS  EPIDERMIDIS FEW ACTINOMYCES NEUII Standardized susceptibility testing for this organism is not available. NO ANAEROBES ISOLATED Performed at [facility], [place] [address] [place], [place] [address]    Report Status 04/22/2021 FINAL  Final   Organism ID, Bacteria STAPHYLOCOCCUS EPIDERMIDIS  Final      Susceptibility   Staphylococcus epidermidis - MIC*    CIPROFLOXACIN <=0.5 SENSITIVE Sensitive     ERYTHROMYCIN >=8 RESISTANT Resistant     GENTAMICIN <=0.5 SENSITIVE Sensitive     OXACILLIN >=4 RESISTANT Resistant     TETRACYCLINE <=1 SENSITIVE Sensitive     VANCOMYCIN 2 SENSITIVE Sensitive     TRIMETH/SULFA <=10 SENSITIVE Sensitive     CLINDAMYCIN <=0.25 SENSITIVE Sensitive     RIFAMPIN <=0.5 SENSITIVE Sensitive     Inducible Clindamycin NEGATIVE Sensitive     * RARE STAPHYLOCOCCUS EPIDERMIDIS  Aerobic/Anaerobic Culture w Gram Stain (surgical/deep wound)     Status: None   Collection Time: 04/17/21  8:47 AM   Specimen: Other Source; Body Fluid  Result Value Ref Range Status   Specimen Description FLUID  Final   Special Requests RIGHT CHEST WALL ABS SPEC B  Final   Gram Stain   Final    ABUNDANT WBC PRESENT, PREDOMINANTLY PMN MODERATE GRAM POSITIVE COCCI IN PAIRS    Culture   Final    FEW ACTINOMYCES NEUII Standardized susceptibility testing for this organism is not available. NO ANAEROBES ISOLATED Performed at [facility], [place] [address] [place], [place] [address]    Report Status 04/22/2021 FINAL  Final  MRSA Next Gen by PCR, Nasal     Status: Abnormal   Collection Time: 04/21/21  6:04 PM   Specimen: Nasal Mucosa; Nasal Swab  Result Value Ref Range Status   MRSA by PCR Next Gen DETECTED (A) NOT DETECTED Final    Comment: RESULT CALLED TO, READ BACK BY AND VERIFIED WITH: PEACH FENSKE RN 04/21/2021 @2000  BY JW Performed at [facility], [place] [address] [place],  [address]  Radiology Studies: No results found.  Marzetta Board, MD,  PhD [facility]  Between 7 am - 7 pm I am available, please contact me via Amion (for emergencies) or Securechat (non urgent messages)  Between 7 pm - 7 am I am not available, please contact night coverage MD/APP via Amion

## 2021-04-23 NOTE — Progress Notes (Signed)
PT Cancellation Note  Patient Details Name: EBONEE STOBER MRN: [id number] DOB: [birth year]   Cancelled Treatment:    Reason Eval/Treat Not Completed: Patient not medically ready.  Pt is nauseated, but would love to try later. 04/23/2021  Ginger Carne., PT Acute Rehabilitation Services [phone]  (pager) [phone]  (office)   Tessie Fass Donisha Hoch 04/23/2021, 1:31 PM

## 2021-04-24 ENCOUNTER — Inpatient Hospital Stay (HOSPITAL_COMMUNITY): Payer: Medicare Other

## 2021-04-24 LAB — COMPREHENSIVE METABOLIC PANEL
ALT: 17 U/L (ref 0–44)
AST: 47 U/L — ABNORMAL HIGH (ref 15–41)
Albumin: 1.7 g/dL — ABNORMAL LOW (ref 3.5–5.0)
Alkaline Phosphatase: 154 U/L — ABNORMAL HIGH (ref 38–126)
Anion gap: 8 (ref 5–15)
BUN: 26 mg/dL — ABNORMAL HIGH (ref 6–20)
CO2: 27 mmol/L (ref 22–32)
Calcium: 8.9 mg/dL (ref 8.9–10.3)
Chloride: 105 mmol/L (ref 98–111)
Creatinine, Ser: 2.18 mg/dL — ABNORMAL HIGH (ref 0.44–1.00)
GFR, Estimated: 27 mL/min — ABNORMAL LOW (ref >60)
Glucose, Bld: 118 mg/dL — ABNORMAL HIGH (ref 70–99)
Potassium: 4.1 mmol/L (ref 3.5–5.1)
Sodium: 140 mmol/L (ref 135–145)
Total Bilirubin: 0.4 mg/dL (ref 0.3–1.2)
Total Protein: 6.6 g/dL (ref 6.5–8.1)

## 2021-04-24 LAB — CBC
HCT: 30.8 % — ABNORMAL LOW (ref 36.0–46.0)
Hemoglobin: 10.6 g/dL — ABNORMAL LOW (ref 12.0–15.0)
MCH: 26.8 pg (ref 26.0–34.0)
MCHC: 34.4 g/dL (ref 30.0–36.0)
MCV: 78 fL — ABNORMAL LOW (ref 80.0–100.0)
Platelets: 516 10*3/uL — ABNORMAL HIGH (ref 150–400)
RBC: 3.95 MIL/uL (ref 3.87–5.11)
RDW: 14.5 % (ref 11.5–15.5)
WBC: 14.5 10*3/uL — ABNORMAL HIGH (ref 4.0–10.5)
nRBC: 0 % (ref 0.0–0.2)

## 2021-04-24 LAB — GLUCOSE, CAPILLARY
Glucose-Capillary: 90 mg/dL (ref 70–99)
Glucose-Capillary: 75 mg/dL (ref 70–99)
Glucose-Capillary: 56 mg/dL — ABNORMAL LOW (ref 70–99)
Glucose-Capillary: 53 mg/dL — ABNORMAL LOW (ref 70–99)
Glucose-Capillary: 83 mg/dL (ref 70–99)
Glucose-Capillary: 154 mg/dL — ABNORMAL HIGH (ref 70–99)
Glucose-Capillary: 111 mg/dL — ABNORMAL HIGH (ref 70–99)
Glucose-Capillary: 77 mg/dL (ref 70–99)

## 2021-04-24 MED ORDER — METOCLOPRAMIDE HCL 5 MG/ML IJ SOLN
10.0000 mg | Freq: Four times a day (QID) | INTRAMUSCULAR | Status: DC | PRN
  Administered 2021-04-24 – 2021-04-25 (×2): 10 mg via INTRAVENOUS
  Filled 2021-04-24 (×2): qty 2

## 2021-04-24 MED ORDER — MECLIZINE HCL 12.5 MG PO TABS
25.0000 mg | ORAL_TABLET | Freq: Three times a day (TID) | ORAL | Status: DC | PRN
  Administered 2021-04-24: 25 mg via ORAL
  Filled 2021-04-24: qty 2

## 2021-04-24 MED ORDER — FUROSEMIDE 10 MG/ML IJ SOLN
60.0000 mg | Freq: Once | INTRAMUSCULAR | Status: AC
  Administered 2021-04-24: 60 mg via INTRAVENOUS
  Filled 2021-04-24: qty 8

## 2021-04-24 NOTE — Progress Notes (Signed)
Hypoglycemic Event  CBG: 56  Treatment: 8 oz juice/soda  Symptoms: None  Follow-up CBG: Time:1753 CBG Result:53  Possible Reasons for Event: Inadequate meal intake  Comments/MD notified: Protocol following   Bess Harvest

## 2021-04-24 NOTE — Progress Notes (Signed)
Pt educated on mobilization safety and use of bed alarm. Patient instructed to use call button for ambulation assistance, patient not compliant.  Gwendolyn Grant, RN

## 2021-04-24 NOTE — Progress Notes (Signed)
Hypoglycemic Event  CBG: 53  Treatment: 8 oz juice/soda  Symptoms: None  Follow-up CBG: Time:1824 CBG Result:83  Possible Reasons for Event: Inadequate meal intake  Comments/MD notified:Patient ordered spaghetti dinner, RN offered variety of foods to eat while waiting (approx. 45 min). Patient agreed to mango sherbet and graham crackers, stated is lactose intolerant, refused cereal, yogurt, peanut butter. RN educated on importance of regular adequate intake of protein and fats with sugars. Will continue to monitor.    Bess Harvest

## 2021-04-24 NOTE — Progress Notes (Signed)
[facility] for Infectious [facility]   Reason for visit: Follow up on chest wall abscess  Interval History: WBC 14.5.  remains afebrile.  Husband at bedside. No new complaints.  No rash or diarrhea.     Physical Exam: Constitutional:  Vitals:   04/24/21 0617 04/24/21 1209  BP: (!) 132/95 (!) 175/96  Pulse:  75  Resp: 18 16  Temp: 97.7 F (36.5 C) (!) 97.5 F (36.4 C)  SpO2: 100%    patient appears in NAD Respiratory: Normal respiratory effort; CTA B Cardiovascular: RRR GI: soft, nt, nd  Review of Systems: Constitutional: negative for fevers and chills Gastrointestinal: negative for nausea and diarrhea Integument/breast: negative for rash  Lab Results  Component Value Date   WBC 14.5 (H) 04/24/2021   HGB 10.6 (L) 04/24/2021   HCT 30.8 (L) 04/24/2021   MCV 78.0 (L) 04/24/2021   PLT 516 (H) 04/24/2021    Lab Results  Component Value Date   CREATININE 2.18 (H) 04/24/2021   BUN 26 (H) 04/24/2021   NA 140 04/24/2021   K 4.1 04/24/2021   CL 105 04/24/2021   CO2 27 04/24/2021    Lab Results  Component Value Date   ALT 17 04/24/2021   AST 47 (H) 04/24/2021   ALKPHOS 154 (H) 04/24/2021     Microbiology: Recent Results (from the past 240 hour(s))  Blood Culture (routine x 2)     Status: None   Collection Time: 04/16/21  7:05 PM   Specimen: BLOOD  Result Value Ref Range Status   Specimen Description BLOOD LEFT ANTECUBITAL  Final   Special Requests   Final    BOTTLES DRAWN AEROBIC AND ANAEROBIC Blood Culture results may not be optimal due to an inadequate volume of blood received in culture bottles   Culture   Final    NO GROWTH 5 DAYS Performed at [facility], [place] [address] [place], [place] [address]    Report Status 04/21/2021 FINAL  Final  Blood Culture (routine x 2)     Status: None   Collection Time: 04/16/21  7:05 PM   Specimen: BLOOD  Result Value Ref Range Status   Specimen Description BLOOD SITE NOT SPECIFIED  Final   Special Requests    Final    BOTTLES DRAWN AEROBIC AND ANAEROBIC Blood Culture results may not be optimal due to an inadequate volume of blood received in culture bottles   Culture   Final    NO GROWTH 5 DAYS Performed at [facility], [place] [address], [place], [place] [address]    Report Status 04/21/2021 FINAL  Final  Resp Panel by RT-PCR (Flu A&B, Covid) Nasopharyngeal Swab     Status: None   Collection Time: 04/16/21  7:35 PM   Specimen: Nasopharyngeal Swab; Nasopharyngeal(NP) swabs in vial transport medium  Result Value Ref Range Status   SARS Coronavirus 2 by RT PCR NEGATIVE NEGATIVE Final    Comment: (NOTE) SARS-CoV-2 target nucleic acids are NOT DETECTED.  The SARS-CoV-2 RNA is generally detectable in upper respiratory specimens during the acute phase of infection. The lowest concentration of SARS-CoV-2 viral copies this assay can detect is 138 copies/mL. A negative result does not preclude SARS-Cov-2 infection and should not be used as the sole basis for treatment or other patient management decisions. A negative result may occur with  improper specimen collection/handling, submission of specimen other than nasopharyngeal swab, presence of viral mutation(s) within the areas targeted by this assay, and  inadequate number of viral copies(<138 copies/mL). A negative result must be combined with clinical observations, patient history, and epidemiological information. The expected result is Negative.  Fact Sheet for Patients:  EntrepreneurPulse.com.au  Fact Sheet for Healthcare Providers:  IncredibleEmployment.be  This test is no t yet approved or cleared by the [place] FDA and  has been authorized for detection and/or diagnosis of SARS-CoV-2 by FDA under an Emergency Use Authorization (EUA). This EUA will remain  in effect (meaning this test can be used) for the duration of the COVID-19 declaration under Section 564(b)(1) of the Act,  21 U.S.C.section 360bbb-3(b)(1), unless the authorization is terminated  or revoked sooner.       Influenza A by PCR NEGATIVE NEGATIVE Final   Influenza B by PCR NEGATIVE NEGATIVE Final    Comment: (NOTE) The Xpert Xpress SARS-CoV-2/FLU/RSV plus assay is intended as an aid in the diagnosis of influenza from Nasopharyngeal swab specimens and should not be used as a sole basis for treatment. Nasal washings and aspirates are unacceptable for Xpert Xpress SARS-CoV-2/FLU/RSV testing.  Fact Sheet for Patients: EntrepreneurPulse.com.au  Fact Sheet for Healthcare Providers: IncredibleEmployment.be  This test is not yet approved or cleared by the [place] FDA and has been authorized for detection and/or diagnosis of SARS-CoV-2 by FDA under an Emergency Use Authorization (EUA). This EUA will remain in effect (meaning this test can be used) for the duration of the COVID-19 declaration under Section 564(b)(1) of the Act, 21 U.S.C. section 360bbb-3(b)(1), unless the authorization is terminated or revoked.  Performed at [facility], [place] [address] [place], [place] [address]   Aerobic/Anaerobic Culture w Gram Stain (surgical/deep wound)     Status: None   Collection Time: 04/17/21  8:43 AM   Specimen: Abscess  Result Value Ref Range Status   Specimen Description ABSCESS  Final   Special Requests NONE  Final   Gram Stain   Final    ABUNDANT WBC PRESENT, PREDOMINANTLY PMN FEW GRAM POSITIVE COCCI IN PAIRS IN CLUSTERS RARE GRAM POSITIVE RODS FEW GRAM NEGATIVE RODS    Culture   Final    RARE STAPHYLOCOCCUS EPIDERMIDIS FEW ACTINOMYCES NEUII Standardized susceptibility testing for this organism is not available. NO ANAEROBES ISOLATED Performed at [facility], [place] [address], [place], [place] [address]    Report Status 04/22/2021 FINAL  Final   Organism ID, Bacteria STAPHYLOCOCCUS EPIDERMIDIS  Final      Susceptibility    Staphylococcus epidermidis - MIC*    CIPROFLOXACIN <=0.5 SENSITIVE Sensitive     ERYTHROMYCIN >=8 RESISTANT Resistant     GENTAMICIN <=0.5 SENSITIVE Sensitive     OXACILLIN >=4 RESISTANT Resistant     TETRACYCLINE <=1 SENSITIVE Sensitive     VANCOMYCIN 2 SENSITIVE Sensitive     TRIMETH/SULFA <=10 SENSITIVE Sensitive     CLINDAMYCIN <=0.25 SENSITIVE Sensitive     RIFAMPIN <=0.5 SENSITIVE Sensitive     Inducible Clindamycin NEGATIVE Sensitive     * RARE STAPHYLOCOCCUS EPIDERMIDIS  Aerobic/Anaerobic Culture w Gram Stain (surgical/deep wound)     Status: None   Collection Time: 04/17/21  8:47 AM   Specimen: Other Source; Body Fluid  Result Value Ref Range Status   Specimen Description FLUID  Final   Special Requests RIGHT CHEST WALL ABS SPEC B  Final   Gram Stain   Final    ABUNDANT WBC PRESENT, PREDOMINANTLY PMN MODERATE GRAM POSITIVE COCCI IN PAIRS    Culture   Final    FEW  ACTINOMYCES NEUII Standardized susceptibility testing for this organism is not available. NO ANAEROBES ISOLATED Performed at [facility], [place] [address], [place], [place] [address]    Report Status 04/22/2021 FINAL  Final  MRSA Next Gen by PCR, Nasal     Status: Abnormal   Collection Time: 04/21/21  6:04 PM   Specimen: Nasal Mucosa; Nasal Swab  Result Value Ref Range Status   MRSA by PCR Next Gen DETECTED (A) NOT DETECTED Final    Comment: RESULT CALLED TO, READ BACK BY AND VERIFIED WITH: PEACH FENSKE RN 04/21/2021 @2000  BY JW Performed at [facility], [place] [address] [place], [place] [address]     Impression/Plan:  1. Actinomyces breast infection - she is on doxycycline and tolerating it well.  She is on this with Staph growth also in culture.  Will then transition her to oral amoxicillin to complete 3 months of treatment as long as her wound is healing well.    2.  Hip pain - she reports better movement, is ambulatory and no new concerns.  No changes and will continue to monitor.    3.   Leukocytosis - trended down and stable now.  No new concerns.    I will sign off, she has follow up with me 05/20/21 at 9:45

## 2021-04-24 NOTE — Progress Notes (Signed)
Patient ID: Shirley [name], female   DOB: September 26, 1975, 46 y.o.   MRN: [id number]  PROGRESS NOTE    Shirley [name]  NWG:[id number] DOB: Jun 10, 1975 DOA: 04/16/2021 PCP: Fredrich Romans, PA   Brief Narrative:  46 y.o. female with medical history significant of poorly controlled insulin-dependent type 2 diabetes (A1c 12.3 a year ago), recurrent abscesses, hypertension, medical noncompliance, CKD stage IIIa, OSA with CPAP, CAD, CVA, anxiety, depression, chronic hep B, hypertension, hyperlipidemia, substance abuse presented to the ED for evaluation of abscess under her right breast with drainage.  On presentation, WBC was 24.2 with creatinine of 1.62.  CT showing edema and soft tissue stranding within the right greater than left mid to lower chest wall with evidence of a 3.8 x 2.9 cm gas and fluid collection in the subcutaneous fat consistent with soft tissue abscess.  Moderate gas tracking posteriorly along the right lateral chest wall.  Soft tissue inflammatory changes extend to the right upper quadrant abdominal wall.  She was started on IV fluids/antibiotics.  General surgery was consulted.  She underwent I&D of right chest wall abscess on 04/17/2021.  Assessment & Plan:   Sepsis: Present on admission Right chest wall abscess -Presented with tachycardia, tachypnea, leukocytosis with right chest wall abscess -Initially treated with broad-spectrum antibiotics. -Status post I&D of right chest wall abscess on 04/17/2021 by general surgery.  Wound care as per general surgery.  General surgery signed off on 04/22/2021.  Outpatient follow-up with general surgery.   -OR cultures grew  rare staph epi, few actinomyces neuii.  ID consulted and recommending a month of doxycycline followed by 2 months of amoxicillin.  Outpatient follow-up with ID.  Leukocytosis -WBCs slightly improving to 14.5 today.  Monitor  AKI on CKD stage IIIa -baseline creatinine around 1.2.  Creatinine peaked up to 4.27 during this  hospitalization.  -Initially treated with IV fluids but subsequently started on IV Lasix from 04/22/2021 onwards because of volume overload. -Creatinine improving to 2.18 today.  Still volume overloaded.  Will give 1 more dose of IV Lasix today.  Hyponatremia -Resolved.  Right hip pain -CT scan of the right hip without acute findings but it did show nonspecific inflammatory changes in the subcutaneous fat surrounding the right hip and pelvis without focal fluid collections.  She has persistent pain in that area, unclear etiology.   -Conservative management, PT  Left upper abdominal swelling -no skin changes on the surface, this appears superficial.  Ultrasound shows edema.  Lasix plan as above.  She continues to have pain somewhat out of proportion with imaging findings  Left adnexal lesion -CT showing a 3.8 cm low density indeterminate left adnexal lesion. -Will need nonemergent outpatient pelvic ultrasound for further evaluation.   Bacterial conjunctivitis -Continue erythromycin ointment   Poorly controlled insulin-dependent type 2 diabetes -A1c 12.3 a year ago and history of medical noncompliance. -Repeat A1c is 13.5.   -Continue long-acting insulin along with CBGs with SSI  Hypertension -Blood pressure improved. -Continue amlodipine   Diabetic neuropathy -Continue gabapentin   OSA -Continue nightly CPAP   CAD -Not endorsing any anginal symptoms.  Generalized deconditioning--PT recommends SNF placement.  Social worker following.  Intermittent IV Lasix.  DVT prophylaxis: SCDs Code Status: Full Family Communication: Husband at bedside on 04/24/2021 Disposition Plan: Status is: Inpatient  Remains inpatient appropriate because: Of severity of illness; requiring IV fluids and antibiotics   Consultants: General surgery/ID  Procedures: I&D of right chest wall abscess on 04/17/2021  Antimicrobials:  Anti-infectives (From admission,  onward)    Start     Dose/Rate  Route Frequency Ordered Stop   04/22/21 1545  doxycycline (VIBRA-TABS) tablet 100 mg        100 mg Oral Every 12 hours 04/22/21 1454     04/21/21 1400  piperacillin-tazobactam (ZOSYN) IVPB 3.375 g  Status:  Discontinued        3.375 g 12.5 mL/hr over 240 Minutes Intravenous Every 8 hours 04/21/21 1243 04/22/21 1454   04/19/21 2200  piperacillin-tazobactam (ZOSYN) IVPB 2.25 g  Status:  Discontinued        2.25 g 100 mL/hr over 30 Minutes Intravenous Every 8 hours 04/19/21 1840 04/21/21 1243   04/19/21 0815  doxycycline (VIBRAMYCIN) 100 mg in sodium chloride 0.9 % 250 mL IVPB  Status:  Discontinued        100 mg 125 mL/hr over 120 Minutes Intravenous Every 12 hours 04/19/21 0729 04/22/21 1454   04/18/21 1000  Vancomycin (VANCOCIN) 1,250 mg in sodium chloride 0.9 % 250 mL IVPB  Status:  Discontinued        1,250 mg 166.7 mL/hr over 90 Minutes Intravenous Every 36 hours 04/16/21 2101 04/18/21 0127   04/18/21 1000  vancomycin (VANCOREADY) IVPB 1250 mg/250 mL  Status:  Discontinued        1,250 mg 166.7 mL/hr over 90 Minutes Intravenous Every 36 hours 04/18/21 0127 04/18/21 1103   04/17/21 0600  piperacillin-tazobactam (ZOSYN) IVPB 3.375 g  Status:  Discontinued        3.375 g 12.5 mL/hr over 240 Minutes Intravenous Every 8 hours 04/16/21 2059 04/19/21 1840   04/16/21 2000  vancomycin (VANCOCIN) 1,750 mg in sodium chloride 0.9 % 500 mL IVPB        1,750 mg 250 mL/hr over 120 Minutes Intravenous  Once 04/16/21 1947 04/17/21 0038   04/16/21 2000  piperacillin-tazobactam (ZOSYN) IVPB 3.375 g        3.375 g 100 mL/hr over 30 Minutes Intravenous  Once 04/16/21 1947 04/16/21 2059        Subjective: Patient seen and examined at bedside.  No fever, chest pain, worsening shortness of breath reported.  Poor historian.  Still feels swollen. Objective: Vitals:   04/23/21 1703 04/23/21 1946 04/23/21 2329 04/24/21 0617  BP: (!) 148/93 (!) 165/96 (!) 150/83 (!) 132/95  Pulse: 86 (!) 107    Resp: 17  16 12 18   Temp: 97.8 F (36.6 C) 97.6 F (36.4 C) 97.7 F (36.5 C) 97.7 F (36.5 C)  TempSrc: Oral Oral Oral Oral  SpO2: 98% 95% 96% 100%  Weight:      Height:        Intake/Output Summary (Last 24 hours) at 04/24/2021 0812 Last data filed at 04/24/2021 0234 Gross per 24 hour  Intake 477 ml  Output --  Net 477 ml    Filed Weights   04/16/21 1847 04/20/21 0500 04/21/21 0600  Weight: 94 kg 120.1 kg 123.7 kg    Examination:  General exam: No distress.  Currently on room air Respiratory system: Bilateral decreased breath sounds at bases with scattered crackles  cardiovascular system: S1-S2 heard; tachycardic intermittently gastrointestinal system: Abdomen is obese, mildly distended; soft and nontender.  Normal bowel sounds are heard extremities: Bilateral lower extremity edema present; no cyanosis Central nervous system: Awake; slow to respond.  Poor historian.  No focal neurological deficits.  Moving extremities  skin: No obvious ecchymosis/ psychiatry: Affect is flat.  Data Reviewed: I have personally reviewed following labs and imaging studies  CBC: Recent Labs  Lab 04/18/21 0748 04/19/21 0248 04/20/21 0403 04/21/21 0304 04/22/21 0628 04/23/21 0155 04/24/21 0209  WBC 22.9* 20.5* 19.2* 15.7* 14.5* 15.4* 14.5*  NEUTROABS 17.4* 13.9* 14.2*  --   --   --   --   HGB 10.6* 10.1* 9.1* 9.5* 10.0* 10.6* 10.6*  HCT 31.3* 29.6* 24.9* 27.7* 29.3* 30.3* 30.8*  MCV 78.8* 77.9* 76.1* 77.8* 77.5* 77.9* 78.0*  PLT 441* 437* 473* 462* 511* 536* 516*    Basic Metabolic Panel: Recent Labs  Lab 04/18/21 0748 04/19/21 0248 04/20/21 0403 04/21/21 0304 04/22/21 0628 04/23/21 0155 04/24/21 0209  NA 133* 134* 132* 131* 137 139 140  K 4.4 4.0 4.2 4.2 3.9 4.3 4.1  CL 101 102 104 102 106 106 105  CO2 20* 21* 17* 19* 24 24 27   GLUCOSE 252* 135* 194* 202* 122* 75 118*  BUN 26* 29* 36* 36* 33* 31* 26*  CREATININE 2.96* 3.80* 4.27* 3.69* 2.99* 2.62* 2.18*  CALCIUM 8.1* 8.0* 8.0*  7.8* 8.3* 8.8* 8.9  MG 1.9 1.8 2.0  --   --   --   --     GFR: Estimated Creatinine Clearance: 43.5 mL/min (A) (by C-G formula based on SCr of 2.18 mg/dL (H)). Liver Function Tests: Recent Labs  Lab 04/24/21 0209  AST 47*  ALT 17  ALKPHOS 154*  BILITOT 0.4  PROT 6.6  ALBUMIN 1.7*    No results for input(s): LIPASE, AMYLASE in the last 168 hours. No results for input(s): AMMONIA in the last 168 hours. Coagulation Profile: No results for input(s): INR, PROTIME in the last 168 hours.  Cardiac Enzymes: Recent Labs  Lab 04/20/21 1746  CKTOTAL 207   BNP (last 3 results) No results for input(s): PROBNP in the last 8760 hours. HbA1C: No results for input(s): HGBA1C in the last 72 hours.  CBG: Recent Labs  Lab 04/23/21 1140 04/23/21 1707 04/23/21 2021 04/23/21 2328 04/24/21 0607  GLUCAP 78 96 155* 155* 90    Lipid Profile: No results for input(s): CHOL, HDL, LDLCALC, TRIG, CHOLHDL, LDLDIRECT in the last 72 hours. Thyroid Function Tests: No results for input(s): TSH, T4TOTAL, FREET4, T3FREE, THYROIDAB in the last 72 hours. Anemia Panel: No results for input(s): VITAMINB12, FOLATE, FERRITIN, TIBC, IRON, RETICCTPCT in the last 72 hours. Sepsis Labs: No results for input(s): PROCALCITON, LATICACIDVEN in the last 168 hours.   Recent Results (from the past 240 hour(s))  Blood Culture (routine x 2)     Status: None   Collection Time: 04/16/21  7:05 PM   Specimen: BLOOD  Result Value Ref Range Status   Specimen Description BLOOD LEFT ANTECUBITAL  Final   Special Requests   Final    BOTTLES DRAWN AEROBIC AND ANAEROBIC Blood Culture results may not be optimal due to an inadequate volume of blood received in culture bottles   Culture   Final    NO GROWTH 5 DAYS Performed at [facility], [place] [address], [place], [place] [address]    Report Status 04/21/2021 FINAL  Final  Blood Culture (routine x 2)     Status: None   Collection Time: 04/16/21  7:05 PM    Specimen: BLOOD  Result Value Ref Range Status   Specimen Description BLOOD SITE NOT SPECIFIED  Final   Special Requests   Final    BOTTLES DRAWN AEROBIC AND ANAEROBIC Blood Culture results may not be optimal due to an inadequate volume of blood received in culture bottles  Culture   Final    NO GROWTH 5 DAYS Performed at [facility], [place] [address], [place], [place] [address]    Report Status 04/21/2021 FINAL  Final  Resp Panel by RT-PCR (Flu A&B, Covid) Nasopharyngeal Swab     Status: None   Collection Time: 04/16/21  7:35 PM   Specimen: Nasopharyngeal Swab; Nasopharyngeal(NP) swabs in vial transport medium  Result Value Ref Range Status   SARS Coronavirus 2 by RT PCR NEGATIVE NEGATIVE Final    Comment: (NOTE) SARS-CoV-2 target nucleic acids are NOT DETECTED.  The SARS-CoV-2 RNA is generally detectable in upper respiratory specimens during the acute phase of infection. The lowest concentration of SARS-CoV-2 viral copies this assay can detect is 138 copies/mL. A negative result does not preclude SARS-Cov-2 infection and should not be used as the sole basis for treatment or other patient management decisions. A negative result may occur with  improper specimen collection/handling, submission of specimen other than nasopharyngeal swab, presence of viral mutation(s) within the areas targeted by this assay, and inadequate number of viral copies(<138 copies/mL). A negative result must be combined with clinical observations, patient history, and epidemiological information. The expected result is Negative.  Fact Sheet for Patients:  EntrepreneurPulse.com.au  Fact Sheet for Healthcare Providers:  IncredibleEmployment.be  This test is no t yet approved or cleared by the [place] FDA and  has been authorized for detection and/or diagnosis of SARS-CoV-2 by FDA under an Emergency Use Authorization (EUA). This EUA will remain  in effect  (meaning this test can be used) for the duration of the COVID-19 declaration under Section 564(b)(1) of the Act, 21 U.S.C.section 360bbb-3(b)(1), unless the authorization is terminated  or revoked sooner.       Influenza A by PCR NEGATIVE NEGATIVE Final   Influenza B by PCR NEGATIVE NEGATIVE Final    Comment: (NOTE) The Xpert Xpress SARS-CoV-2/FLU/RSV plus assay is intended as an aid in the diagnosis of influenza from Nasopharyngeal swab specimens and should not be used as a sole basis for treatment. Nasal washings and aspirates are unacceptable for Xpert Xpress SARS-CoV-2/FLU/RSV testing.  Fact Sheet for Patients: EntrepreneurPulse.com.au  Fact Sheet for Healthcare Providers: IncredibleEmployment.be  This test is not yet approved or cleared by the [place] FDA and has been authorized for detection and/or diagnosis of SARS-CoV-2 by FDA under an Emergency Use Authorization (EUA). This EUA will remain in effect (meaning this test can be used) for the duration of the COVID-19 declaration under Section 564(b)(1) of the Act, 21 U.S.C. section 360bbb-3(b)(1), unless the authorization is terminated or revoked.  Performed at [facility], [place] [address], [place], [place] [address]   Aerobic/Anaerobic Culture w Gram Stain (surgical/deep wound)     Status: None   Collection Time: 04/17/21  8:43 AM   Specimen: Abscess  Result Value Ref Range Status   Specimen Description ABSCESS  Final   Special Requests NONE  Final   Gram Stain   Final    ABUNDANT WBC PRESENT, PREDOMINANTLY PMN FEW GRAM POSITIVE COCCI IN PAIRS IN CLUSTERS RARE GRAM POSITIVE RODS FEW GRAM NEGATIVE RODS    Culture   Final    RARE STAPHYLOCOCCUS EPIDERMIDIS FEW ACTINOMYCES NEUII Standardized susceptibility testing for this organism is not available. NO ANAEROBES ISOLATED Performed at [facility], [place] [address], [place], [place] [address]    Report Status  04/22/2021 FINAL  Final   Organism ID, Bacteria STAPHYLOCOCCUS EPIDERMIDIS  Final  Susceptibility   Staphylococcus epidermidis - MIC*    CIPROFLOXACIN <=0.5 SENSITIVE Sensitive     ERYTHROMYCIN >=8 RESISTANT Resistant     GENTAMICIN <=0.5 SENSITIVE Sensitive     OXACILLIN >=4 RESISTANT Resistant     TETRACYCLINE <=1 SENSITIVE Sensitive     VANCOMYCIN 2 SENSITIVE Sensitive     TRIMETH/SULFA <=10 SENSITIVE Sensitive     CLINDAMYCIN <=0.25 SENSITIVE Sensitive     RIFAMPIN <=0.5 SENSITIVE Sensitive     Inducible Clindamycin NEGATIVE Sensitive     * RARE STAPHYLOCOCCUS EPIDERMIDIS  Aerobic/Anaerobic Culture w Gram Stain (surgical/deep wound)     Status: None   Collection Time: 04/17/21  8:47 AM   Specimen: Other Source; Body Fluid  Result Value Ref Range Status   Specimen Description FLUID  Final   Special Requests RIGHT CHEST WALL ABS SPEC B  Final   Gram Stain   Final    ABUNDANT WBC PRESENT, PREDOMINANTLY PMN MODERATE GRAM POSITIVE COCCI IN PAIRS    Culture   Final    FEW ACTINOMYCES NEUII Standardized susceptibility testing for this organism is not available. NO ANAEROBES ISOLATED Performed at [facility], [place] [address], [place], [place] [address]    Report Status 04/22/2021 FINAL  Final  MRSA Next Gen by PCR, Nasal     Status: Abnormal   Collection Time: 04/21/21  6:04 PM   Specimen: Nasal Mucosa; Nasal Swab  Result Value Ref Range Status   MRSA by PCR Next Gen DETECTED (A) NOT DETECTED Final    Comment: RESULT CALLED TO, READ BACK BY AND VERIFIED WITH: PEACH FENSKE RN 04/21/2021 @2000  BY JW Performed at [facility], [place] [address] [place], [place] [address]           Radiology Studies: No results found.      Scheduled Meds:  acetaminophen  1,000 mg Oral Q6H   amLODipine  10 mg Oral Daily   doxycycline  100 mg Oral Q12H   gabapentin  400 mg Oral Daily   insulin aspart  0-15 Units Subcutaneous TID WC   insulin aspart  0-5 Units  Subcutaneous QHS   insulin aspart  5 Units Subcutaneous TID WC   insulin glargine-yfgn  30 Units Subcutaneous Daily   polyethylene glycol  17 g Oral Daily   Continuous Infusions:          Aline August, MD [facility] 04/24/2021, 8:12 AM

## 2021-04-24 NOTE — TOC Initial Note (Signed)
Transition of Care [facility]) - Initial/Assessment Note    Patient Details  Name: Shirley [name] MRN: [id number] Date of Birth: 05-04-1975  Transition of Care [facility]) CM/SW Contact:    Coralee Pesa, [place] Phone Number: 04/24/2021, 10:16 AM  Clinical Narrative:                 CSW met with pt and significant other at bedside and explained SNF recommendation. Pt states she is not interested in going to a facility, and asks if PT can come to her. She states her significant other cares for her at home, address is [place].  Pt does not have any DME, and does not want a 3 in 1. Pt believes she had [facility] previously and is agreeable to them again. She states significant other will transport her home at time of DC. CM notified of plan and that pt is not medically stable at this time. TOC will continue to follow for DC needs.  Expected Discharge Plan: [place] Barriers to Discharge: Continued Medical Work up   Patient Goals and CMS Choice Patient states their goals for this hospitalization and ongoing recovery are:: Pt states her goal is to be able to return home with her significant other. CMS Medicare.gov Compare Post Acute Care list provided to:: Patient Choice offered to / list presented to : Patient  Expected Discharge Plan and Services Expected Discharge Plan: [place]   Discharge Planning Services: CM Consult Post Acute Care Choice: [place] arrangements for the past 2 months: Apartment                                      Prior Living Arrangements/Services Living arrangements for the past 2 months: Apartment Lives with:: Spouse Patient language and need for interpreter reviewed:: Yes Do you feel safe going back to the place where you live?: Yes      Need for Family Participation in Patient Care: Yes (Comment) Care giver support system in place?: Yes (comment)   Criminal Activity/Legal Involvement  Pertinent to Current Situation/Hospitalization: No - Comment as needed  Activities of Daily Living Home Assistive Devices/Equipment: None ADL Screening (condition at time of admission) Patient's cognitive ability adequate to safely complete daily activities?: Yes Is the patient deaf or have difficulty hearing?: No Does the patient have difficulty seeing, even when wearing glasses/contacts?: No Does the patient have difficulty concentrating, remembering, or making decisions?: No Patient able to express need for assistance with ADLs?: Yes Does the patient have difficulty dressing or bathing?: No Independently performs ADLs?: Yes (appropriate for developmental age) Does the patient have difficulty walking or climbing stairs?: Yes Weakness of Legs: Both Weakness of Arms/Hands: None  Permission Sought/Granted Permission sought to share information with : Family Supports Permission granted to share information with : Yes, Verbal Permission Granted  Share Information with NAME: Madelin Headings     Permission granted to share info w Relationship: Significant other  Permission granted to share info w Contact Information: [phone]  Emotional Assessment Appearance:: Appears stated age Attitude/Demeanor/Rapport: Engaged Affect (typically observed): Appropriate Orientation: : Oriented to Self, Oriented to Place, Oriented to  Time, Oriented to Situation Alcohol / Substance Use: Not Applicable Psych Involvement: No (comment)  Admission diagnosis:  Abscess [L02.91] Abdominal wall abscess [L02.211] Acute bacterial conjunctivitis of both eyes [H10.33] Sepsis, due to unspecified organism,  unspecified whether acute organ dysfunction present [facility]) [A41.9] Patient Active Problem List   Diagnosis Date Noted   Sepsis ([place]) 04/17/2021   Conjunctivitis 04/17/2021   Abscess 04/16/2021   Abscess of bursa of right foot    Diabetic foot infection ([place]) 04/06/2020   Cellulitis of foot, right    Puncture  wound of right foot    Sepsis due to pneumonia ([place]) 11/24/2019   Thrombocytosis 11/24/2019   Chronic pain 11/24/2019   History of pulmonary embolism 10/10/2017   Chronic anticoagulation 10/10/2017   GERD (gastroesophageal reflux disease) 10/10/2017   Leukocytosis 10/10/2017   Prolonged QT interval 10/10/2017   Nausea & vomiting 09/20/2017   Hypertensive urgency 09/20/2017   Intractable nausea and vomiting 08/08/2017   Chronic maxillary sinusitis 01/02/2017   Acute blood loss anemia 11/13/2016   Menorrhagia 11/13/2016   Bilateral pulmonary embolism ([place]) 10/30/2016   Acute DVT (deep venous thrombosis) ([facility]) 10/30/2016   Nausea and vomiting 09/23/2016   Type 2 diabetes mellitus with hyperglycemia ([place]) 04/07/2016   Diabetic gastroparesis ([place]) 04/06/2016   Dehydration 03/27/2015   Gastroparesis 11/11/2014   Hematemesis 10/13/2014   DKA (diabetic ketoacidoses) 10/13/2014   Abdominal pain, chronic, left lower quadrant    Diabetic gastroparesis associated with type 2 diabetes mellitus ([place]) 35/67/0141   History of Helicobacter pylori infection 04/22/2014   Vaginal discharge 02/18/2014   Unspecified constipation 07/21/2013   Tinea corporis 07/21/2013   Intractable vomiting 04/29/2013   Dysmenorrhea 04/22/2013   UTI (urinary tract infection) 07/15/2012   Headache(784.0) 02/08/2012   Health care maintenance 01/22/2012   Chronic hepatitis B ([place]) 03/07/2011   History of leukocytosis 04/06/2010   THROMBOCYTOSIS 04/06/2010   Polysubstance abuse ([place]) 02/23/2010   Iron deficiency anemia 11/22/2009   PERIPHERAL NEUROPATHY 10/01/2009   Hyperlipidemia 08/30/2009   Diabetic polyneuropathy ([place]) 08/30/2009   Hidradenitis (recurrent boils) 07/07/2008   Depression 12/27/2007   Abdominal pain, left lower quadrant 11/21/2007   FIBROMYALGIA 10/30/2007   BACK PAIN 04/01/2007   OBSTRUCTIVE SLEEP APNEA 01/17/2007   ANXIETY DEPRESSION 06/27/2006   Chronic ischemic heart disease 06/15/2006    OBESITY, MORBID 05/15/2006   MIGRAINE HEADACHE 05/15/2006   Asthma 05/15/2006   Essential hypertension 01/16/2006   IRREGULAR MENSTRUATION 01/16/2006   PEDAL EDEMA 01/16/2006   Poorly controlled type 2 diabetes mellitus with peripheral neuropathy ([place]) 01/16/1989   PCP:  Fredrich Romans, [place] Pharmacy:   [facility] [place], [place] [address] [place] [address] Phone: [phone] Fax: [phone]     Social Determinants of Health (SDOH) Interventions    Readmission Risk Interventions No flowsheet data found.

## 2021-04-25 LAB — CBC WITH DIFFERENTIAL/PLATELET
Abs Immature Granulocytes: 0.08 10*3/uL — ABNORMAL HIGH (ref 0.00–0.07)
Basophils Absolute: 0.1 10*3/uL (ref 0.0–0.1)
Basophils Relative: 0 %
Eosinophils Absolute: 0.3 10*3/uL (ref 0.0–0.5)
Eosinophils Relative: 2 %
HCT: 30.7 % — ABNORMAL LOW (ref 36.0–46.0)
Hemoglobin: 10.4 g/dL — ABNORMAL LOW (ref 12.0–15.0)
Immature Granulocytes: 1 %
Lymphocytes Relative: 26 %
Lymphs Abs: 3.7 10*3/uL (ref 0.7–4.0)
MCH: 26.5 pg (ref 26.0–34.0)
MCHC: 33.9 g/dL (ref 30.0–36.0)
MCV: 78.1 fL — ABNORMAL LOW (ref 80.0–100.0)
Monocytes Absolute: 1 10*3/uL (ref 0.1–1.0)
Monocytes Relative: 7 %
Neutro Abs: 9.1 10*3/uL — ABNORMAL HIGH (ref 1.7–7.7)
Neutrophils Relative %: 64 %
Platelets: 588 10*3/uL — ABNORMAL HIGH (ref 150–400)
RBC: 3.93 MIL/uL (ref 3.87–5.11)
RDW: 14.6 % (ref 11.5–15.5)
WBC: 14.3 10*3/uL — ABNORMAL HIGH (ref 4.0–10.5)
nRBC: 0 % (ref 0.0–0.2)

## 2021-04-25 LAB — GLUCOSE, CAPILLARY
Glucose-Capillary: 117 mg/dL — ABNORMAL HIGH (ref 70–99)
Glucose-Capillary: 42 mg/dL — CL (ref 70–99)
Glucose-Capillary: 47 mg/dL — ABNORMAL LOW (ref 70–99)
Glucose-Capillary: 67 mg/dL — ABNORMAL LOW (ref 70–99)
Glucose-Capillary: 87 mg/dL (ref 70–99)
Glucose-Capillary: 111 mg/dL — ABNORMAL HIGH (ref 70–99)
Glucose-Capillary: 63 mg/dL — ABNORMAL LOW (ref 70–99)
Glucose-Capillary: 62 mg/dL — ABNORMAL LOW (ref 70–99)
Glucose-Capillary: 55 mg/dL — ABNORMAL LOW (ref 70–99)
Glucose-Capillary: 69 mg/dL — ABNORMAL LOW (ref 70–99)
Glucose-Capillary: 85 mg/dL (ref 70–99)
Glucose-Capillary: 74 mg/dL (ref 70–99)
Glucose-Capillary: 91 mg/dL (ref 70–99)

## 2021-04-25 LAB — BASIC METABOLIC PANEL
Anion gap: 11 (ref 5–15)
BUN: 23 mg/dL — ABNORMAL HIGH (ref 6–20)
CO2: 26 mmol/L (ref 22–32)
Calcium: 9.1 mg/dL (ref 8.9–10.3)
Chloride: 104 mmol/L (ref 98–111)
Creatinine, Ser: 1.88 mg/dL — ABNORMAL HIGH (ref 0.44–1.00)
GFR, Estimated: 33 mL/min — ABNORMAL LOW (ref >60)
Glucose, Bld: 70 mg/dL (ref 70–99)
Potassium: 4.1 mmol/L (ref 3.5–5.1)
Sodium: 141 mmol/L (ref 135–145)

## 2021-04-25 LAB — C-REACTIVE PROTEIN: CRP: 0.9 mg/dL (ref <1.0)

## 2021-04-25 MED ORDER — HYDRALAZINE HCL 25 MG PO TABS: 25.0000 mg | ORAL_TABLET | Freq: Four times a day (QID) | ORAL | Status: DC | PRN

## 2021-04-25 MED ORDER — FUROSEMIDE 10 MG/ML IJ SOLN
60.0000 mg | Freq: Once | INTRAMUSCULAR | Status: AC
  Administered 2021-04-25: 60 mg via INTRAVENOUS
  Filled 2021-04-25: qty 8

## 2021-04-25 MED ORDER — DEXTROSE 50 % IV SOLN
25.0000 mL | Freq: Once | INTRAVENOUS | Status: AC
  Administered 2021-04-25: 25 mL via INTRAVENOUS
  Filled 2021-04-25: qty 50

## 2021-04-25 MED ORDER — INSULIN GLARGINE-YFGN 100 UNIT/ML ~~LOC~~ SOLN
10.0000 [IU] | Freq: Every day | SUBCUTANEOUS | Status: DC
  Administered 2021-04-25: 10 [IU] via SUBCUTANEOUS
  Filled 2021-04-25: qty 0.1

## 2021-04-25 NOTE — Progress Notes (Signed)
Physical Therapy Treatment Patient Details Name: Shirley [name] MRN: [id number] DOB: [birth year] Today's Date: 04/25/2021   History of Present Illness 46 y/o female presented to ED on 12/24 for evaluation of abscess under R breast with drainage. S/p I&D R chest wall abscess on 12/25. Now complaining of R hip pain. PMH: poorly controlled insulin-dependent type 2 diabetes (A1c 12.3 a year ago), recurrent abscesses, hypertension, medical noncompliance, CKD stage IIIa, OSA with CPAP, CAD, CVA, anxiety, depression, chronic hep B, hypertension, hyperlipidemia, substance abuse.    PT Comments    Pt received in supine, lethargic but agreeable to therapy session with encouragement and with good participation and fair tolerance for transfer and gait training. Pt able to progress gait distance to 76ft with RW support and performed 1 step with min guard. Pt has 14 STE home and currently unable to tolerate >1 step prior to needing to sit down to rest, therefore continue to recommend short term low intensity post-acute rehab. She will need to perform longer household distance gait trials and flight of steps prior to DC home or consider PTAR transport home if unable to ascend full flight of steps. Pt continues to benefit from PT services to progress toward functional mobility goals.   Recommendations for follow up therapy are one component of a multi-disciplinary discharge planning process, led by the attending physician.  Recommendations may be updated based on patient status, additional functional criteria and insurance authorization.  Follow Up Recommendations  Skilled nursing-short term rehab (<3 hours/day)     Assistance Recommended at Discharge Frequent or constant Supervision/Assistance  Equipment Recommendations  Rolling walker (2 wheels)    Recommendations for Other Services       Precautions / Restrictions Precautions Precautions: Fall     Mobility  Bed Mobility Overal bed mobility: Needs  Assistance Bed Mobility: Supine to Sit;Sit to Supine     Supine to sit: Supervision Sit to supine: Min guard;Supervision   General bed mobility comments: increased time, pt to long sit then pivots over to/from EOB. did not need physical assist today.    Transfers Overall transfer level: Needs assistance Equipment used: Rolling walker (2 wheels) Transfers: Sit to/from Stand Sit to Stand: Supervision           General transfer comment: from EOB to RW and chair to RW with supervision, greatly increased time to perform    Ambulation/Gait Ambulation/Gait assistance: Min guard Gait Distance (Feet): 55 Feet Assistive device: Rolling walker (2 wheels) Gait Pattern/deviations: Step-to pattern;Decreased stride length Gait velocity: <0.3 m/s Gait velocity interpretation: <1.31 ft/sec, indicative of household ambulator   General Gait Details: very slow speed, short step-to gait pattern with minimal improvement after cues for lengthened stride. Pt reports 8/10 rt hip pain but not overly antaglic gait pattern with RW use,   Stairs Stairs: Yes Stairs assistance: Min guard Stair Management: With walker;Forwards;Step to pattern Number of Stairs: 1 General stair comments: pt ascended/descended single step x1 rep with BUE support of RW and min guard for safety, no buckling or LOB. Pt reports increased pain and requesting to defer further stairs. She has 14 STE home, will need to continue stair training next session.   Wheelchair Mobility    Modified Rankin (Stroke Patients Only)       Balance Overall balance assessment: Needs assistance Sitting-balance support: No upper extremity supported;Feet supported Sitting balance-Leahy Scale: Fair     Standing balance support: Bilateral upper extremity supported;During functional activity Standing balance-Leahy Scale: Fair Standing balance comment: fair balance  with use of RW, min guard for static standing without AD (while donning mask,  etc)        Cognition Arousal/Alertness: Lethargic;Suspect due to medications Behavior During Therapy: Flat affect Overall Cognitive Status: Within Functional Limits for tasks assessed         General Comments: pt with slow processing throughout, slow mobility speed and at times closing while seated EOB and in chair.        Exercises      General Comments General comments (skin integrity, edema, etc.): VSS on RA per chart review, pt drowsy but no other acute s/sx distress and pt reports no increased dizziness during gait trial.      Pertinent Vitals/Pain Pain Assessment: 0-10 Pain Score: 8  Pain Location: R hip Pain Descriptors / Indicators: Grimacing;Sore Pain Intervention(s): Limited activity within patient's tolerance;Monitored during session;Premedicated before session;Repositioned     PT Goals (current goals can now be found in the care plan section) Acute Rehab PT Goals Patient Stated Goal: to reduce pain and go home PT Goal Formulation: With patient Time For Goal Achievement: 05/04/21 Progress towards PT goals: Progressing toward goals    Frequency    Min 3X/week      PT Plan Discharge plan needs to be updated       AM-PAC PT "6 Clicks" Mobility   Outcome Measure  Help needed turning from your back to your side while in a flat bed without using bedrails?: A [place] Help needed moving from lying on your back to sitting on the side of a flat bed without using bedrails?: A Lot (mod cues today) Help needed moving to and from a bed to a chair (including a wheelchair)?: A Lot (mod cues today) Help needed standing up from a chair using your arms (e.g., wheelchair or bedside chair)?: A Lot Help needed to walk in hospital room?: Total (pt unable today) Help needed climbing 3-5 steps with a railing? : Total 6 Click Score: 11    End of Session Equipment Utilized During Treatment: Gait belt Activity Tolerance: Patient limited by lethargy;Other  (comment);Patient limited by pain Patient left: in bed;with call bell/phone within reach;with bed alarm set;Other (comment) (encouraged OOB to chair and chair set up for her but pt refusing) Nurse Communication: Mobility status PT Visit Diagnosis: Unsteadiness on feet (R26.81);Muscle weakness (generalized) (M62.81);Pain Pain - Right/Left: Right Pain - part of body: Hip     Time: 1610-9604 PT Time Calculation (min) (ACUTE ONLY): 23 min  Charges:  $Gait Training: 8-22 mins $Therapeutic Activity: 8-22 mins                     Crislyn Willbanks P., PTA Acute Rehabilitation Services Pager: [phone] Office: [phone]    Dorathy Kinsman Mark Hassey 04/25/2021, 4:07 PM

## 2021-04-25 NOTE — Progress Notes (Signed)
TRH night cross cover note:  I was notified by RN that the patient has been experiencing recurrent episodes of hypoglycemia, with several CBG readings in the 40s to 50s, although asymptomatic at these times.  She is on Lantus 30 units subcu every morning, but has not received any scheduled or short acting NovoLog over the last day.  In light of this, I have reduced the patient's dose of Lantus from 30 to 10 units subcu every morning, with plan for additional modifications per ensuing review by rounding hospitalist via analysis of additional/ensuing CBG data points.     Babs Bertin, DO Hospitalist

## 2021-04-25 NOTE — Progress Notes (Signed)
Patient ID: Shirley [name], female   DOB: 10-Jul-1975, 46 y.o.   MRN: [id number]  PROGRESS NOTE    Shirley [name]  CHE:[id number] DOB: 1976-04-23 DOA: 04/16/2021 PCP: Fredrich Romans, PA   Brief Narrative:  46 y.o. female with medical history significant of poorly controlled insulin-dependent type 2 diabetes (A1c 12.3 a year ago), recurrent abscesses, hypertension, medical noncompliance, CKD stage IIIa, OSA with CPAP, CAD, CVA, anxiety, depression, chronic hep B, hypertension, hyperlipidemia, substance abuse presented to the ED for evaluation of abscess under her right breast with drainage.  On presentation, WBC was 24.2 with creatinine of 1.62.  CT showing edema and soft tissue stranding within the right greater than left mid to lower chest wall with evidence of a 3.8 x 2.9 cm gas and fluid collection in the subcutaneous fat consistent with soft tissue abscess.  Moderate gas tracking posteriorly along the right lateral chest wall.  Soft tissue inflammatory changes extend to the right upper quadrant abdominal wall.  She was started on IV fluids/antibiotics.  General surgery was consulted.  She underwent I&D of right chest wall abscess on 04/17/2021.  Assessment & Plan:   Sepsis: Present on admission Right chest wall abscess -Presented with tachycardia, tachypnea, leukocytosis with right chest wall abscess -Initially treated with broad-spectrum antibiotics. -Status post I&D of right chest wall abscess on 04/17/2021 by general surgery.  Wound care as per general surgery.  General surgery signed off on 04/22/2021.  Outpatient follow-up with general surgery.   -OR cultures grew  rare staph epi, few actinomyces neuii.  ID consulted and recommending a month of doxycycline followed by 2 months of amoxicillin.  Outpatient follow-up with ID.  ID has signed off.  Leukocytosis -WBCs slightly improving to 14.3 today.  Monitor  AKI on CKD stage IIIa -baseline creatinine around 1.2.  Creatinine peaked up to  4.27 during this hospitalization.  -Initially treated with IV fluids but subsequently started on IV Lasix from 04/22/2021 onwards because of volume overload. -Creatinine improving to 1.88 today.  Still volume overloaded.  Will give 1 more dose of IV Lasix today.  Hyponatremia -Resolved.  Right hip pain -CT scan of the right hip without acute findings but it did show nonspecific inflammatory changes in the subcutaneous fat surrounding the right hip and pelvis without focal fluid collections.  She has persistent pain in that area, unclear etiology.   -Conservative management, PT  Left upper abdominal swelling -no skin changes on the surface, this appears superficial.  Ultrasound shows edema.  Lasix plan as above.  She continues to have pain somewhat out of proportion with imaging findings  Left adnexal lesion -CT showing a 3.8 cm low density indeterminate left adnexal lesion. -Will need nonemergent outpatient pelvic ultrasound for further evaluation.   Bacterial conjunctivitis -Continue erythromycin ointment   Poorly controlled insulin-dependent type 2 diabetes with hyperglycemia and hypoglycemia -A1c 12.3 a year ago and history of medical noncompliance. -Repeat A1c is 13.5.   -Patient had episodes of hypoglycemia early this morning.  Decrease dose of long-acting insulin.  Continue CBGs with SSI.  Hypertension -Blood pressure still intermittently elevated. -Continue amlodipine   Diabetic neuropathy -Continue gabapentin   OSA -Continue nightly CPAP   CAD -Not endorsing any anginal symptoms.  Generalized deconditioning--PT recommends SNF placement.  Patient refused SNF placement on 04/24/2021.  Will need home health PT.  DVT prophylaxis: SCDs Code Status: Full Family Communication: Husband at bedside on 04/25/2021 Disposition Plan: Status is: Inpatient  Remains inpatient appropriate because: Being volume overloaded  and need for IV Lasix.  Possible discharge tomorrow on  04/26/2021.  Consultants: General surgery/ID  Procedures: I&D of right chest wall abscess on 04/17/2021  Antimicrobials:  Anti-infectives (From admission, onward)    Start     Dose/Rate Route Frequency Ordered Stop   04/22/21 1545  doxycycline (VIBRA-TABS) tablet 100 mg        100 mg Oral Every 12 hours 04/22/21 1454     04/21/21 1400  piperacillin-tazobactam (ZOSYN) IVPB 3.375 g  Status:  Discontinued        3.375 g 12.5 mL/hr over 240 Minutes Intravenous Every 8 hours 04/21/21 1243 04/22/21 1454   04/19/21 2200  piperacillin-tazobactam (ZOSYN) IVPB 2.25 g  Status:  Discontinued        2.25 g 100 mL/hr over 30 Minutes Intravenous Every 8 hours 04/19/21 1840 04/21/21 1243   04/19/21 0815  doxycycline (VIBRAMYCIN) 100 mg in sodium chloride 0.9 % 250 mL IVPB  Status:  Discontinued        100 mg 125 mL/hr over 120 Minutes Intravenous Every 12 hours 04/19/21 0729 04/22/21 1454   04/18/21 1000  Vancomycin (VANCOCIN) 1,250 mg in sodium chloride 0.9 % 250 mL IVPB  Status:  Discontinued        1,250 mg 166.7 mL/hr over 90 Minutes Intravenous Every 36 hours 04/16/21 2101 04/18/21 0127   04/18/21 1000  vancomycin (VANCOREADY) IVPB 1250 mg/250 mL  Status:  Discontinued        1,250 mg 166.7 mL/hr over 90 Minutes Intravenous Every 36 hours 04/18/21 0127 04/18/21 1103   04/17/21 0600  piperacillin-tazobactam (ZOSYN) IVPB 3.375 g  Status:  Discontinued        3.375 g 12.5 mL/hr over 240 Minutes Intravenous Every 8 hours 04/16/21 2059 04/19/21 1840   04/16/21 2000  vancomycin (VANCOCIN) 1,750 mg in sodium chloride 0.9 % 500 mL IVPB        1,750 mg 250 mL/hr over 120 Minutes Intravenous  Once 04/16/21 1947 04/17/21 0038   04/16/21 2000  piperacillin-tazobactam (ZOSYN) IVPB 3.375 g        3.375 g 100 mL/hr over 30 Minutes Intravenous  Once 04/16/21 1947 04/16/21 2059        Subjective: Patient seen and examined at bedside.  Still feels swollen in her lower extremities.  Had poor oral intake  yesterday.  No overnight fever or vomiting reported.   Objective: Vitals:   04/25/21 0040 04/25/21 0430 04/25/21 0515 04/25/21 0744  BP: (!) 163/91 (!) 140/93  (!) 158/99  Pulse:  98  92  Resp:  18  16  Temp:  97.6 F (36.4 C)  (!) 97.4 F (36.3 C)  TempSrc:  Oral  Oral  SpO2:  96%  92%  Weight:   114.6 kg   Height:        Intake/Output Summary (Last 24 hours) at 04/25/2021 1011 Last data filed at 04/25/2021 0430 Gross per 24 hour  Intake 960 ml  Output --  Net 960 ml    Filed Weights   04/20/21 0500 04/21/21 0600 04/25/21 0515  Weight: 120.1 kg 123.7 kg 114.6 kg    Examination:  General exam: On room air currently.  No acute distress.   Respiratory system: Decreased breath sounds at bases bilaterally with some crackles  cardiovascular system: Currently rate controlled; S1-S2 heard gastrointestinal system: Abdomen is obese, distended slightly; soft and nontender.  Bowel sounds heard extremities: No clubbing; 1-2+ pitting edema in bilateral lower extremities Central nervous system: Still slow  to respond; poor historian.  No focal neurological deficits.  Moves extremities skin: No obvious petechiae/ecchymosis psychiatry: Flat affect.  Data Reviewed: I have personally reviewed following labs and imaging studies  CBC: Recent Labs  Lab 04/19/21 0248 04/20/21 0403 04/21/21 0304 04/22/21 2778 04/23/21 0155 04/24/21 0209 04/25/21 0221  WBC 20.5* 19.2* 15.7* 14.5* 15.4* 14.5* 14.3*  NEUTROABS 13.9* 14.2*  --   --   --   --  9.1*  HGB 10.1* 9.1* 9.5* 10.0* 10.6* 10.6* 10.4*  HCT 29.6* 24.9* 27.7* 29.3* 30.3* 30.8* 30.7*  MCV 77.9* 76.1* 77.8* 77.5* 77.9* 78.0* 78.1*  PLT 437* 473* 462* 511* 536* 516* 588*    Basic Metabolic Panel: Recent Labs  Lab 04/19/21 0248 04/20/21 0403 04/21/21 0304 04/22/21 0628 04/23/21 0155 04/24/21 0209 04/25/21 0221  NA 134* 132* 131* 137 139 140 141  K 4.0 4.2 4.2 3.9 4.3 4.1 4.1  CL 102 104 102 106 106 105 104  CO2 21* 17* 19* 24  24 27 26   GLUCOSE 135* 194* 202* 122* 75 118* 70  BUN 29* 36* 36* 33* 31* 26* 23*  CREATININE 3.80* 4.27* 3.69* 2.99* 2.62* 2.18* 1.88*  CALCIUM 8.0* 8.0* 7.8* 8.3* 8.8* 8.9 9.1  MG 1.8 2.0  --   --   --   --   --     GFR: Estimated Creatinine Clearance: 48.4 mL/min (A) (by C-G formula based on SCr of 1.88 mg/dL (H)). Liver Function Tests: Recent Labs  Lab 04/24/21 0209  AST 47*  ALT 17  ALKPHOS 154*  BILITOT 0.4  PROT 6.6  ALBUMIN 1.7*    No results for input(s): LIPASE, AMYLASE in the last 168 hours. No results for input(s): AMMONIA in the last 168 hours. Coagulation Profile: No results for input(s): INR, PROTIME in the last 168 hours.  Cardiac Enzymes: Recent Labs  Lab 04/20/21 1746  CKTOTAL 207    BNP (last 3 results) No results for input(s): PROBNP in the last 8760 hours. HbA1C: No results for input(s): HGBA1C in the last 72 hours.  CBG: Recent Labs  Lab 04/25/21 0415 04/25/21 0431 04/25/21 0450 04/25/21 0510 04/25/21 0609  GLUCAP 42* 47* 67* 87 111*    Lipid Profile: No results for input(s): CHOL, HDL, LDLCALC, TRIG, CHOLHDL, LDLDIRECT in the last 72 hours. Thyroid Function Tests: No results for input(s): TSH, T4TOTAL, FREET4, T3FREE, THYROIDAB in the last 72 hours. Anemia Panel: No results for input(s): VITAMINB12, FOLATE, FERRITIN, TIBC, IRON, RETICCTPCT in the last 72 hours. Sepsis Labs: No results for input(s): PROCALCITON, LATICACIDVEN in the last 168 hours.   Recent Results (from the past 240 hour(s))  Blood Culture (routine x 2)     Status: None   Collection Time: 04/16/21  7:05 PM   Specimen: BLOOD  Result Value Ref Range Status   Specimen Description BLOOD LEFT ANTECUBITAL  Final   Special Requests   Final    BOTTLES DRAWN AEROBIC AND ANAEROBIC Blood Culture results may not be optimal due to an inadequate volume of blood received in culture bottles   Culture   Final    NO GROWTH 5 DAYS Performed at [facility], [place]  [address], [place], [place] [address]    Report Status 04/21/2021 FINAL  Final  Blood Culture (routine x 2)     Status: None   Collection Time: 04/16/21  7:05 PM   Specimen: BLOOD  Result Value Ref Range Status   Specimen Description BLOOD SITE NOT SPECIFIED  Final  Special Requests   Final    BOTTLES DRAWN AEROBIC AND ANAEROBIC Blood Culture results may not be optimal due to an inadequate volume of blood received in culture bottles   Culture   Final    NO GROWTH 5 DAYS Performed at [facility], [place] [address] [place], [place] [address]    Report Status 04/21/2021 FINAL  Final  Resp Panel by RT-PCR (Flu A&B, Covid) Nasopharyngeal Swab     Status: None   Collection Time: 04/16/21  7:35 PM   Specimen: Nasopharyngeal Swab; Nasopharyngeal(NP) swabs in vial transport medium  Result Value Ref Range Status   SARS Coronavirus 2 by RT PCR NEGATIVE NEGATIVE Final    Comment: (NOTE) SARS-CoV-2 target nucleic acids are NOT DETECTED.  The SARS-CoV-2 RNA is generally detectable in upper respiratory specimens during the acute phase of infection. The lowest concentration of SARS-CoV-2 viral copies this assay can detect is 138 copies/mL. A negative result does not preclude SARS-Cov-2 infection and should not be used as the sole basis for treatment or other patient management decisions. A negative result may occur with  improper specimen collection/handling, submission of specimen other than nasopharyngeal swab, presence of viral mutation(s) within the areas targeted by this assay, and inadequate number of viral copies(<138 copies/mL). A negative result must be combined with clinical observations, patient history, and epidemiological information. The expected result is Negative.  Fact Sheet for Patients:  EntrepreneurPulse.com.au  Fact Sheet for Healthcare Providers:  IncredibleEmployment.be  This test is no t yet approved or cleared by the [place]  FDA and  has been authorized for detection and/or diagnosis of SARS-CoV-2 by FDA under an Emergency Use Authorization (EUA). This EUA will remain  in effect (meaning this test can be used) for the duration of the COVID-19 declaration under Section 564(b)(1) of the Act, 21 U.S.C.section 360bbb-3(b)(1), unless the authorization is terminated  or revoked sooner.       Influenza A by PCR NEGATIVE NEGATIVE Final   Influenza B by PCR NEGATIVE NEGATIVE Final    Comment: (NOTE) The Xpert Xpress SARS-CoV-2/FLU/RSV plus assay is intended as an aid in the diagnosis of influenza from Nasopharyngeal swab specimens and should not be used as a sole basis for treatment. Nasal washings and aspirates are unacceptable for Xpert Xpress SARS-CoV-2/FLU/RSV testing.  Fact Sheet for Patients: EntrepreneurPulse.com.au  Fact Sheet for Healthcare Providers: IncredibleEmployment.be  This test is not yet approved or cleared by the [place] FDA and has been authorized for detection and/or diagnosis of SARS-CoV-2 by FDA under an Emergency Use Authorization (EUA). This EUA will remain in effect (meaning this test can be used) for the duration of the COVID-19 declaration under Section 564(b)(1) of the Act, 21 U.S.C. section 360bbb-3(b)(1), unless the authorization is terminated or revoked.  Performed at [facility], [place] [address], [place], [place] [address]   Aerobic/Anaerobic Culture w Gram Stain (surgical/deep wound)     Status: None   Collection Time: 04/17/21  8:43 AM   Specimen: Abscess  Result Value Ref Range Status   Specimen Description ABSCESS  Final   Special Requests NONE  Final   Gram Stain   Final    ABUNDANT WBC PRESENT, PREDOMINANTLY PMN FEW GRAM POSITIVE COCCI IN PAIRS IN CLUSTERS RARE GRAM POSITIVE RODS FEW GRAM NEGATIVE RODS    Culture   Final    RARE STAPHYLOCOCCUS EPIDERMIDIS FEW ACTINOMYCES NEUII Standardized susceptibility  testing for this organism is not available. NO ANAEROBES ISOLATED Performed at  [facility], [place] [address], [place], [place] [address]    Report Status 04/22/2021 FINAL  Final   Organism ID, Bacteria STAPHYLOCOCCUS EPIDERMIDIS  Final      Susceptibility   Staphylococcus epidermidis - MIC*    CIPROFLOXACIN <=0.5 SENSITIVE Sensitive     ERYTHROMYCIN >=8 RESISTANT Resistant     GENTAMICIN <=0.5 SENSITIVE Sensitive     OXACILLIN >=4 RESISTANT Resistant     TETRACYCLINE <=1 SENSITIVE Sensitive     VANCOMYCIN 2 SENSITIVE Sensitive     TRIMETH/SULFA <=10 SENSITIVE Sensitive     CLINDAMYCIN <=0.25 SENSITIVE Sensitive     RIFAMPIN <=0.5 SENSITIVE Sensitive     Inducible Clindamycin NEGATIVE Sensitive     * RARE STAPHYLOCOCCUS EPIDERMIDIS  Aerobic/Anaerobic Culture w Gram Stain (surgical/deep wound)     Status: None   Collection Time: 04/17/21  8:47 AM   Specimen: Other Source; Body Fluid  Result Value Ref Range Status   Specimen Description FLUID  Final   Special Requests RIGHT CHEST WALL ABS SPEC B  Final   Gram Stain   Final    ABUNDANT WBC PRESENT, PREDOMINANTLY PMN MODERATE GRAM POSITIVE COCCI IN PAIRS    Culture   Final    FEW ACTINOMYCES NEUII Standardized susceptibility testing for this organism is not available. NO ANAEROBES ISOLATED Performed at [facility], [place] [address], [place], [place] [address]    Report Status 04/22/2021 FINAL  Final  MRSA Next Gen by PCR, Nasal     Status: Abnormal   Collection Time: 04/21/21  6:04 PM   Specimen: Nasal Mucosa; Nasal Swab  Result Value Ref Range Status   MRSA by PCR Next Gen DETECTED (A) NOT DETECTED Final    Comment: RESULT CALLED TO, READ BACK BY AND VERIFIED WITH: PEACH FENSKE RN 04/21/2021 @2000  BY JW Performed at [facility], [place] [address], [place], [place] [address]           Radiology Studies: MR HIP RIGHT WO CONTRAST  Result Date: 04/24/2021 CLINICAL DATA:  Right hip pain for 1 week. EXAM: MR  OF THE RIGHT HIP WITHOUT CONTRAST TECHNIQUE: Multiplanar, multisequence MR imaging was performed. No intravenous contrast was administered. COMPARISON:  CT scan 04/20/2021 FINDINGS: Both hips are normally located. No stress fracture or AVN. No hip joint effusion or bony changes to suggest septic arthritis or osteomyelitis. No periarticular fluid collections to suggest a paralabral cyst. There are mild age advanced degenerative changes. The pubic symphysis and SI joints are intact. No pelvic fractures or bone lesions. No findings for septic arthritis. Diffuse subcutaneous soft tissue swelling/edema and patchy fluid but no discrete subcutaneous abscess. There is also mild diffuse myositis without findings for pyomyositis. No significant intrapelvic abnormalities. Small scattered borderline pelvic and inguinal lymph nodes likely inflammatory/reactive. IMPRESSION: 1. Diffuse subcutaneous soft tissue swelling/edema and patchy fluid but no discrete subcutaneous abscess. 2. Mild diffuse myositis without findings for pyomyositis. 3. No findings for septic arthritis or osteomyelitis. 4. No stress fracture or bone lesions. Electronically Signed   By: Marijo Sanes M.D.   On: 04/24/2021 09:23        Scheduled Meds:  acetaminophen  1,000 mg Oral Q6H   amLODipine  10 mg Oral Daily   doxycycline  100 mg Oral Q12H   gabapentin  400 mg Oral Daily   insulin aspart  0-15 Units Subcutaneous TID WC   insulin aspart  0-5 Units Subcutaneous QHS   insulin aspart  5 Units Subcutaneous  TID WC   insulin glargine-yfgn  10 Units Subcutaneous Daily   polyethylene glycol  17 g Oral Daily   Continuous Infusions:          Aline August, MD [facility] 04/25/2021, 10:11 AM

## 2021-04-25 NOTE — Plan of Care (Signed)
RN tries to motivate pt to utilize PRN PO pain meds and to eat more to decrease nausea. See order changes for hypoglycemic events. Dsg changed. PT worked with pt. Pt. Slept most of the day. Pain has been 8-9/10 majority of the day. IV prn pain med utilized after dsg change. Educated on discharge pain meds and the importance of pain being controlled with po.   Problem: Education: Goal: Knowledge of General Education information will improve Description: Including pain rating scale, medication(s)/side effects and non-pharmacologic comfort measures Outcome: Progressing   Problem: Health Behavior/Discharge Planning: Goal: Ability to manage health-related needs will improve Outcome: Progressing   Problem: Clinical Measurements: Goal: Ability to maintain clinical measurements within normal limits will improve Outcome: Progressing Goal: Will remain free from infection Outcome: Progressing Goal: Diagnostic test results will improve Outcome: Progressing Goal: Respiratory complications will improve Outcome: Progressing Goal: Cardiovascular complication will be avoided Outcome: Progressing   Problem: Activity: Goal: Risk for activity intolerance will decrease Outcome: Progressing   Problem: Nutrition: Goal: Adequate nutrition will be maintained Outcome: Progressing   Problem: Coping: Goal: Level of anxiety will decrease Outcome: Progressing   Problem: Elimination: Goal: Will not experience complications related to bowel motility Outcome: Progressing Goal: Will not experience complications related to urinary retention Outcome: Progressing   Problem: Pain Managment: Goal: General experience of comfort will improve Outcome: Progressing   Problem: Safety: Goal: Ability to remain free from injury will improve Outcome: Progressing   Problem: Skin Integrity: Goal: Risk for impaired skin integrity will decrease Outcome: Progressing

## 2021-04-25 NOTE — Significant Event (Signed)
Hypoglycemic Event  CBG:  0415: 42  - drank 8oz orange juice.  0431: 55 - drank more juice and graham crackers 0450: 67 - eating chicken noodle soup and drinking boost breeze 0510: 87  Symptoms: None  Possible Reasons for Event: Inadequate meal intake. Per pt she has not been eating much during the day.   Noted pt has not received any aspart in past 24 hours but is receiving 30 units lantus each morning.   BG was 77 at 2323 last night and pt had chicken noodle soup and juice then BG 117. BG also in 50s during day shift evening.   Comments/MD notified: Dr. Velia Meyer notified of above and MD decreased lantus dose to 10 units.    RN educated pt on signs and symptoms of hypoglycemia (although pt asymptomatic). Also educated on trying to eat protein or drink ensure to help keep BG stable. Pt states she does not like any of the boost or ensure drinks. After discussion pt agreeable to try peach Boost Breeze and pt states likes it ok to drink it.     Frederico Hamman

## 2021-04-25 NOTE — Plan of Care (Signed)

## 2021-04-25 NOTE — Progress Notes (Addendum)
CRITICAL VALUE STICKER  CRITICAL VALUE: BG 63  RECEIVER (on-site recipient of call):treated per protocol.  DATE & TIME NOTIFIED: 04/25/2021 1325  MESSENGER (representative from lab): Treated per protocol see orders.  MD NOTIFIED: Marita Snellen, MD   TIME OF NOTIFICATION: 1330  RESPONSE:  Aware

## 2021-04-25 NOTE — Progress Notes (Signed)
CRITICAL VALUE STICKER  CRITICAL VALUE: 39  RECEIVER (on-site recipient of call): treated per protocol.  DATE & TIME NOTIFIED: 1405 04/25/2021  MESSENGER (representative from lab): Treated another 4oz,  RESPONSE: See discontinued orders.

## 2021-04-26 ENCOUNTER — Other Ambulatory Visit (HOSPITAL_COMMUNITY): Payer: Self-pay

## 2021-04-26 LAB — CBC WITH DIFFERENTIAL/PLATELET
Abs Immature Granulocytes: 0.1 10*3/uL — ABNORMAL HIGH (ref 0.00–0.07)
Basophils Absolute: 0.1 10*3/uL (ref 0.0–0.1)
Basophils Relative: 0 %
Eosinophils Absolute: 0.4 10*3/uL (ref 0.0–0.5)
Eosinophils Relative: 2 %
HCT: 31.7 % — ABNORMAL LOW (ref 36.0–46.0)
Hemoglobin: 11 g/dL — ABNORMAL LOW (ref 12.0–15.0)
Immature Granulocytes: 1 %
Lymphocytes Relative: 24 %
Lymphs Abs: 4 10*3/uL (ref 0.7–4.0)
MCH: 27 pg (ref 26.0–34.0)
MCHC: 34.7 g/dL (ref 30.0–36.0)
MCV: 77.9 fL — ABNORMAL LOW (ref 80.0–100.0)
Monocytes Absolute: 1.3 10*3/uL — ABNORMAL HIGH (ref 0.1–1.0)
Monocytes Relative: 8 %
Neutro Abs: 11.1 10*3/uL — ABNORMAL HIGH (ref 1.7–7.7)
Neutrophils Relative %: 65 %
Platelets: 601 10*3/uL — ABNORMAL HIGH (ref 150–400)
RBC: 4.07 MIL/uL (ref 3.87–5.11)
RDW: 14.7 % (ref 11.5–15.5)
WBC: 16.9 10*3/uL — ABNORMAL HIGH (ref 4.0–10.5)
nRBC: 0 % (ref 0.0–0.2)

## 2021-04-26 LAB — MAGNESIUM: Magnesium: 1.5 mg/dL — ABNORMAL LOW (ref 1.7–2.4)

## 2021-04-26 LAB — GLUCOSE, CAPILLARY
Glucose-Capillary: 65 mg/dL — ABNORMAL LOW (ref 70–99)
Glucose-Capillary: 111 mg/dL — ABNORMAL HIGH (ref 70–99)

## 2021-04-26 LAB — BASIC METABOLIC PANEL
Anion gap: 10 (ref 5–15)
BUN: 25 mg/dL — ABNORMAL HIGH (ref 6–20)
CO2: 26 mmol/L (ref 22–32)
Calcium: 9 mg/dL (ref 8.9–10.3)
Chloride: 103 mmol/L (ref 98–111)
Creatinine, Ser: 1.93 mg/dL — ABNORMAL HIGH (ref 0.44–1.00)
GFR, Estimated: 32 mL/min — ABNORMAL LOW (ref >60)
Glucose, Bld: 73 mg/dL (ref 70–99)
Potassium: 4.2 mmol/L (ref 3.5–5.1)
Sodium: 139 mmol/L (ref 135–145)

## 2021-04-26 LAB — C-REACTIVE PROTEIN: CRP: <0.5 mg/dL (ref <1.0)

## 2021-04-26 MED ORDER — GABAPENTIN 400 MG PO CAPS: 400.0000 mg | ORAL_CAPSULE | Freq: Every day | ORAL | Status: DC

## 2021-04-26 MED ORDER — SENNA 8.6 MG PO TABS
1.0000 | ORAL_TABLET | Freq: Two times a day (BID) | ORAL | 0 refills | Status: AC
  Filled 2021-04-26: qty 30, 15d supply, fill #0

## 2021-04-26 MED ORDER — MECLIZINE HCL 25 MG PO TABS
25.0000 mg | ORAL_TABLET | Freq: Three times a day (TID) | ORAL | 0 refills | Status: AC | PRN
Start: 2021-04-26 — End: ?
  Filled 2021-04-26: qty 30, 10d supply, fill #0

## 2021-04-26 MED ORDER — METHOCARBAMOL 500 MG PO TABS
500.0000 mg | ORAL_TABLET | Freq: Four times a day (QID) | ORAL | 0 refills | Status: DC | PRN
  Filled 2021-04-26: qty 30, 8d supply, fill #0

## 2021-04-26 MED ORDER — MAGNESIUM OXIDE -MG SUPPLEMENT 400 (240 MG) MG PO TABS
800.0000 mg | ORAL_TABLET | Freq: Once | ORAL | Status: DC
  Filled 2021-04-26: qty 2

## 2021-04-26 MED ORDER — POLYETHYLENE GLYCOL 3350 17 GM/SCOOP PO POWD
17.0000 g | Freq: Every day | ORAL | 0 refills | Status: DC | PRN
  Filled 2021-04-26: qty 238, 14d supply, fill #0

## 2021-04-26 MED ORDER — DOXYCYCLINE HYCLATE 100 MG PO TABS
100.0000 mg | ORAL_TABLET | Freq: Two times a day (BID) | ORAL | 0 refills | Status: DC
  Filled 2021-04-26: qty 50, 25d supply, fill #0

## 2021-04-26 MED ORDER — FUROSEMIDE 40 MG PO TABS
40.0000 mg | ORAL_TABLET | Freq: Every day | ORAL | 0 refills | Status: DC
Start: 2021-04-26 — End: 2022-03-23
  Filled 2021-04-26: qty 7, 7d supply, fill #0

## 2021-04-26 MED ORDER — AMLODIPINE BESYLATE 10 MG PO TABS
10.0000 mg | ORAL_TABLET | Freq: Every day | ORAL | 0 refills | Status: DC
  Filled 2021-04-26: qty 30, 30d supply, fill #0

## 2021-04-26 NOTE — Plan of Care (Signed)
  Problem: Education: Goal: Knowledge of General Education information will improve Description: Including pain rating scale, medication(s)/side effects and non-pharmacologic comfort measures Outcome: Adequate for Discharge   

## 2021-04-26 NOTE — Discharge Summary (Addendum)
Physician Discharge Summary  Shirley [name]:[id number] DOB: Sep 11, 1975 DOA: 04/16/2021  PCP: Fredrich Romans, PA  Admit date: 04/16/2021 Discharge date: 04/26/2021  Admitted From: Home Disposition: Home  Recommendations for Outpatient Follow-up:  Follow up with PCP in 1 week with repeat CBC/BMP Outpatient follow-up with general surgery and ID Follow up in ED if symptoms worsen or new appear   Home Health: No Equipment/Devices: None  Discharge Condition: Stable CODE STATUS: Full Diet recommendation: Heart healthy/carb modified  Brief/Interim Summary: 46 y.o. female with medical history significant of poorly controlled insulin-dependent type 2 diabetes (A1c 12.3 a year ago), recurrent abscesses, hypertension, medical noncompliance, CKD stage IIIa, OSA with CPAP, CAD, CVA, anxiety, depression, chronic hep B, hypertension, hyperlipidemia, substance abuse presented to the ED for evaluation of abscess under her right breast with drainage.  On presentation, WBC was 24.2 with creatinine of 1.62.  CT showing edema and soft tissue stranding within the right greater than left mid to lower chest wall with evidence of a 3.8 x 2.9 cm gas and fluid collection in the subcutaneous fat consistent with soft tissue abscess.  Moderate gas tracking posteriorly along the right lateral chest wall.  Soft tissue inflammatory changes extend to the right upper quadrant abdominal wall.  She was started on IV fluids/antibiotics.  General surgery was consulted.  She underwent I&D of right chest wall abscess on 04/17/2021.  During the hospitalization, or cultures grew rare staph epi and few actinomyces.  Antibiotics were subsequently switched to oral doxycycline by ID with recommendations for outpatient follow-up with ID.  General surgery has also signed off.  She had issues with volume overload and acute kidney injury requiring IV Lasix intermittently.  Volume overload improving.  Renal function improving.  PT recommended  SNF placement.  Patient refused SNF placement.  Discharge patient home today on oral doxycycline and Lasix.  Outpatient follow-up with PCP/general surgery/ID.  Discharge Diagnoses:   Sepsis: Present on admission: Resolved Right chest wall abscess -Presented with tachycardia, tachypnea, leukocytosis with right chest wall abscess -Initially treated with broad-spectrum antibiotics. -Status post I&D of right chest wall abscess on 04/17/2021 by general surgery.  Wound care as per general surgery.  General surgery signed off on 04/22/2021.  Outpatient follow-up with general surgery.   -OR cultures grew  rare staph epi, few actinomyces neuii.  ID consulted and recommending a month of doxycycline followed by 2 months of amoxicillin.  Once doxycycline is completed, amoxicillin can be prescribed by PCP as an outpatient.  Outpatient follow-up with ID.  ID has signed off.   Leukocytosis -Still significant at 16.9 today but CRP is less than 0.5.  Patient is not spiking temperature.    AKI on CKD stage IIIa -baseline creatinine around 1.2.  Creatinine peaked up to 4.27 during this hospitalization.  -Initially treated with IV fluids but subsequently started on IV Lasix from 04/22/2021 onwards because of volume overload. -Creatinine improving to 1.93 today.  Outpatient follow-up of BMP.  Will discharge on Lasix 40 mg daily for 7 days.    Hyponatremia -Resolved.  Hypomagnesemia -Replace prior to discharge.   Right hip pain -CT scan of the right hip without acute findings but it did show nonspecific inflammatory changes in the subcutaneous fat surrounding the right hip and pelvis without focal fluid collections.  She has persistent pain in that area, unclear etiology.   -Conservative management, PT   Left adnexal lesion -CT showing a 3.8 cm low density indeterminate left adnexal lesion. -Will need nonemergent outpatient pelvic  ultrasound for further evaluation.   Bacterial conjunctivitis -Treated  with erythromycin ointment.  Poorly controlled insulin-dependent type 2 diabetes with hyperglycemia and hypoglycemia -A1c 12.3 a year ago and history of medical noncompliance. -Repeat A1c is 13.5.   -Patient had episodes of hypoglycemia over the first few days.  Long-acting insulin has been held.  We will keep long-acting insulin on hold for now.  Outpatient follow-up with PCP.  Carb modified diet.  He is short acting insulin with meals as needed.   Hypertension -Blood pressure still intermittently elevated. -Continue amlodipine.  We will also discharged on Lasix 40 mg daily for 7 days.  Outpatient follow-up with PCP.  Diabetic neuropathy -Continue gabapentin at reduced dose.   OSA -Continue nightly CPAP   CAD -Not endorsing any anginal symptoms.   Generalized deconditioning--PT recommends SNF placement.  Patient refused SNF placement on 04/24/2021.  Will need home health PT.  Discharge Instructions  Discharge Instructions     Ambulatory referral to Infectious Disease   Complete by: As directed    Diet - low sodium heart healthy   Complete by: As directed    Discharge wound care:   Complete by: As directed    As per general surgery recommendations   Increase activity slowly   Complete by: As directed       Allergies as of 04/26/2021       Reactions   Lisinopril Nausea Only, Swelling   Morphine And Related Itching, Swelling        Medication List     STOP taking these medications    cyclobenzaprine 5 MG tablet Commonly known as: FLEXERIL   Lantus SoloStar 100 UNIT/ML Solostar Pen Generic drug: insulin glargine   levocetirizine 5 MG tablet Commonly known as: Xyzal   losartan 100 MG tablet Commonly known as: COZAAR   losartan-hydrochlorothiazide 100-25 MG tablet Commonly known as: HYZAAR   ondansetron 4 MG disintegrating tablet Commonly known as: Zofran ODT   silver sulfADIAZINE 1 % cream Commonly known as: Silvadene   traMADol 50 MG tablet Commonly  known as: ULTRAM   traZODone 50 MG tablet Commonly known as: DESYREL       TAKE these medications    acetaminophen 500 MG tablet Commonly known as: TYLENOL Take 1,000 mg by mouth every 6 (six) hours as needed for moderate pain.   albuterol 108 (90 Base) MCG/ACT inhaler Commonly known as: VENTOLIN HFA Inhale 2 puffs into the lungs every 4 (four) hours as needed for wheezing or shortness of breath.   amLODipine 10 MG tablet Commonly known as: NORVASC Take 1 tablet (10 mg total) by mouth daily.   atorvastatin 20 MG tablet Commonly known as: LIPITOR Take 20 mg by mouth at bedtime.   blood glucose meter kit and supplies Kit Dispense based on patient and insurance preference. Use up to four times daily as directed. (FOR ICD-9 250.00, 250.01).   diphenhydrAMINE 25 MG tablet Commonly known as: BENADRYL Take 25 mg by mouth every 6 (six) hours as needed for allergies.   doxycycline 100 MG tablet Commonly known as: VIBRA-TABS Take 1 tablet (100 mg total) by mouth every 12 (twelve) hours.   famotidine 20 MG tablet Commonly known as: PEPCID Take 20 mg by mouth every evening.   furosemide 40 MG tablet Commonly known as: Lasix Take 1 tablet (40 mg total) by mouth daily for 7 days.   gabapentin 400 MG capsule Commonly known as: NEURONTIN Take 1 capsule (400 mg total) by mouth daily at 6 (  six) AM. What changed: when to take this   HumaLOG KwikPen 100 UNIT/ML KwikPen Generic drug: insulin lispro Inject 15 Units into the skin 4 (four) times daily -  before meals and at bedtime. Hold if blood glucose is less than 110   INSULIN SYRINGE .5CC/28G 28G X 1/2" 0.5 ML Misc Please provide 1 month supply   meclizine 25 MG tablet Commonly known as: ANTIVERT Take 1 tablet (25 mg total) by mouth 3 (three) times daily as needed for dizziness.   methocarbamol 500 MG tablet Commonly known as: ROBAXIN Take 1 tablet (500 mg total) by mouth every 6 (six) hours as needed for muscle spasms.    omeprazole 40 MG capsule Commonly known as: PRILOSEC Take 40 mg by mouth daily.   OneTouch Delica Lancets 19F Misc Use to check blood sugar 3 times daily. Dx codes: E11.8, E11.65   OneTouch Verio Reflect w/Device Kit 1 each by Does not apply route 3 (three) times daily.   OneTouch Verio test strip Generic drug: glucose blood Use as instructed to check blood sugar 3 times daily. Dx codes: E11.8, E11.65   oxyCODONE-acetaminophen 10-325 MG tablet Commonly known as: PERCOCET Take 1 tablet by mouth 4 (four) times daily as needed for pain.   Pen Needles 3/16" 31G X 5 MM Misc 1 Device by Does not apply route 4 (four) times daily - after meals and at bedtime.   polyethylene glycol 17 g packet Commonly known as: MIRALAX / GLYCOLAX Take 17 g by mouth daily as needed.   senna 8.6 MG Tabs tablet Commonly known as: SENOKOT Take 1 tablet (8.6 mg total) by mouth 2 (two) times daily.   Trulicity 4.5 XT/0.2IO Sopn Generic drug: Dulaglutide Inject 4.5 mg into the skin once a week.               Durable Medical Equipment  (From admission, onward)           Start     Ordered   04/22/21 1052  For home use only DME Walker wide  Once       Question:  Patient needs a walker to treat with the following condition  Answer:  Weakness   04/22/21 1051              Discharge Care Instructions  (From admission, onward)           Start     Ordered   04/26/21 0000  Discharge wound care:       Comments: As per general surgery recommendations   04/26/21 1006            Follow-up [facility]. Call in 3 week(s).   Specialty: General Surgery Why: For wound re-check Contact information: [place] [facility].   Why: The home health agency will contact you for the first home visit. Contact information: [phone]         [facility] Romans, [place]. Schedule an appointment as soon as possible for a visit in 1 week(s).   Specialty: Physician Assistant Why: with repeat cbc/bmp Contact information: [address] [place] [address] [phone]                Allergies  Allergen Reactions   Lisinopril Nausea Only and Swelling   Morphine And Related Itching and Swelling    Consultations: General  surgery/ID   Procedures/Studies: DG Chest 1 View  Result Date: 04/20/2021 CLINICAL DATA:  Shortness of breath and dry cough. EXAM: CHEST  1 VIEW COMPARISON:  Chest x-ray and chest CT 04/16/2021 FINDINGS: The cardiac silhouette, mediastinal and hilar contours are within normal limits. Patchy left basilar density could be atelectasis or early infiltrate. No pleural effusions. IMPRESSION: Patchy left basilar density, atelectasis versus early infiltrate. Electronically Signed   By: Marijo Sanes M.D.   On: 04/20/2021 15:27   US RENAL  Result Date: 04/19/2021 CLINICAL DATA:  Acute kidney injury, inpatient EXAM: RENAL / URINARY TRACT ULTRASOUND COMPLETE COMPARISON:  04/16/2021 CT chest, abdomen and pelvis FINDINGS: Right Kidney: Renal measurements: 11.2 x 5.7 x 6.8 cm = volume: 227 mL. Mildly echogenic right renal parenchyma, normal thickness. No hydronephrosis. No convincing shadowing renal stones. No renal masses. Left Kidney: Renal measurements: 10.7 x 6.4 x 5.2 cm = volume: 184 mL. Mildly echogenic left renal parenchyma, though. No hydronephrosis. No renal stones or masses. Bladder: Appears normal for degree of bladder distention. Other: None. IMPRESSION: 1. No hydronephrosis. 2. Echogenic kidneys, compatible with nonspecific acute renal parenchymal disease. 3. Normal bladder. Electronically Signed   By: Ilona Sorrel M.D.   On: 04/19/2021 13:36   CT HIP RIGHT WO CONTRAST  Result Date: 04/20/2021 CLINICAL DATA:  Right hip pain. EXAM: CT OF THE RIGHT HIP WITHOUT CONTRAST TECHNIQUE: Multidetector CT imaging of  the right hip was performed according to the standard protocol. Multiplanar CT image reconstructions were also generated. COMPARISON:  None. FINDINGS: The right hip is normally located. Mild degenerative changes but no fracture or AVN. The visualized right hemipelvic bony structures are intact. Nonspecific inflammatory changes are noted in the subcutaneous fat surrounding the right hip and pelvis. No obvious hip joint effusion. No focal fluid collections. The hip and pelvic musculature are grossly normal by CT. No obvious muscle tear or intramuscular hematoma. No significant intrapelvic findings are identified IMPRESSION: 1. Nonspecific inflammatory changes in the subcutaneous fat surrounding the right hip and pelvis. No focal fluid collections. 2. No acute bony findings or worrisome bone lesions. Electronically Signed   By: Marijo Sanes M.D.   On: 04/20/2021 15:24   MR HIP RIGHT WO CONTRAST  Result Date: 04/24/2021 CLINICAL DATA:  Right hip pain for 1 week. EXAM: MR OF THE RIGHT HIP WITHOUT CONTRAST TECHNIQUE: Multiplanar, multisequence MR imaging was performed. No intravenous contrast was administered. COMPARISON:  CT scan 04/20/2021 FINDINGS: Both hips are normally located. No stress fracture or AVN. No hip joint effusion or bony changes to suggest septic arthritis or osteomyelitis. No periarticular fluid collections to suggest a paralabral cyst. There are mild age advanced degenerative changes. The pubic symphysis and SI joints are intact. No pelvic fractures or bone lesions. No findings for septic arthritis. Diffuse subcutaneous soft tissue swelling/edema and patchy fluid but no discrete subcutaneous abscess. There is also mild diffuse myositis without findings for pyomyositis. No significant intrapelvic abnormalities. Small scattered borderline pelvic and inguinal lymph nodes likely inflammatory/reactive. IMPRESSION: 1. Diffuse subcutaneous soft tissue swelling/edema and patchy fluid but no discrete  subcutaneous abscess. 2. Mild diffuse myositis without findings for pyomyositis. 3. No findings for septic arthritis or osteomyelitis. 4. No stress fracture or bone lesions. Electronically Signed   By: Marijo Sanes M.D.   On: 04/24/2021 09:23   CT CHEST ABDOMEN PELVIS W CONTRAST  Result Date: 04/16/2021 CLINICAL DATA:  Sepsis pain beneath the right breast EXAM: CT CHEST, ABDOMEN, AND PELVIS WITH CONTRAST TECHNIQUE: Multidetector  CT imaging of the chest, abdomen and pelvis was performed following the standard protocol during bolus administration of intravenous contrast. CONTRAST:  119m OMNIPAQUE IOHEXOL 300 MG/ML  SOLN COMPARISON:  CT abdomen pelvis 09/21/2017, chest x-ray 04/16/2021, CT chest 11/07/2016 FINDINGS: CT CHEST FINDINGS Cardiovascular: Nonaneurysmal aorta. Normal cardiac size. No pericardial effusion. Mediastinum/Nodes: Midline trachea. No thyroid mass. No suspicious mediastinal lymph nodes. Enlarged right axillary lymph nodes measuring up to 15 mm. Esophagus within normal limits. Lungs/Pleura: Left upper lobe bronchiectasis with associated stellate density at left apex, no significant change since 2018 and presumably scarring. Other areas of scarring are visualized in the left upper lobe/lingula. 23 posterior pleural thickening with possible mild calcification on the left suggestive of pleural plaques. Musculoskeletal: Sternum is intact. Thoracic vertebral bodies demonstrate normal stature. Subcutaneous edema within the right greater than left chest wall. Gas and fluid collection within the right anterolateral chest wall, inferior to the right breast, this measures approximately 3.8 by 2.9 cm and would be consistent with soft tissue abscess. Moderate gas tracking within the right lateral chest wall soft tissues posteriorly. Edema and soft tissue stranding extend to the right upper quadrant abdominal wall subcutaneous fat. CT ABDOMEN PELVIS FINDINGS Hepatobiliary: No focal liver abnormality is  seen. No gallstones, gallbladder wall thickening, or biliary dilatation. Pancreas: Unremarkable. No pancreatic ductal dilatation or surrounding inflammatory changes. Spleen: Normal in size without focal abnormality. Adrenals/Urinary Tract: Adrenal glands are unremarkable. Kidneys are normal, without renal calculi, focal lesion, or hydronephrosis. Bladder is unremarkable. Stomach/Bowel: Stomach nonenlarged. No dilated small bowel. No acute bowel wall thickening. Negative appendix. Vascular/Lymphatic: Mild atherosclerosis. No aneurysm. No suspicious nodes Reproductive: Lobulated uterus with probable fibroids. Uterus is deviated to the right pelvis. 3.8 by 2 cm low-density left adnexal lesion, not entirely consistent with simple cyst. Other: Negative for pelvic effusion or free air. Musculoskeletal: No acute or suspicious osseous abnormality. Advanced degenerative changes at L5-S1. IMPRESSION: 1. Edema and soft tissue stranding within the right greater than left mid to lower chest wall with evidence for a 3.8 x 2.9 cm gas and fluid collection in the subcutaneous fat consistent with soft tissue abscess. Moderate gas tracking posteriorly along the right lateral chest wall. Soft tissue inflammatory changes extend to the right upper quadrant anterior abdominal wall. 2. Mild right axillary adenopathy, likely reactive 3. No CT evidence for acute intra-abdominal or pelvic abnormality. Fibroid uterus. 4. 3.8 cm low-density indeterminate left adnexal lesion. Suggest correlation with non emergent pelvic ultrasound. Electronically Signed   By: KDonavan FoilM.D.   On: 04/16/2021 23:04   DG Chest Port 1 View  Result Date: 04/16/2021 CLINICAL DATA:  Questionable sepsis - evaluate for abnormality EXAM: PORTABLE CHEST 1 VIEW COMPARISON:  February 09, 2020 FINDINGS: The cardiomediastinal silhouette is unchanged in contour. No pleural effusion. No pneumothorax. No acute pleuroparenchymal abnormality. Visualized abdomen is  unremarkable. IMPRESSION: No acute cardiopulmonary abnormality. Electronically Signed   By: SValentino SaxonM.D.   On: 04/16/2021 19:26   [place]ST SOFT TISSUE  Result Date: 04/21/2021 CLINICAL DATA:  Superficial soft tissue swelling in the left upper abdomen EXAM: LIMITED ULTRASOUND OF ABDOMINAL SOFT TISSUES TECHNIQUE: Ultrasound examination of the abdominal wall soft tissues was performed in the area of clinical concern. COMPARISON:  None. FINDINGS: Ultrasound images limited to the area of abnormality in the superficial subcutaneous tissues of the left upper quadrant. In the area of palpable swelling, edematous changes are demonstrated in the subcutaneous fatty tissues. Suggestion of possible fat herniation through a small defect  in the fascia. No loculated collections are seen. IMPRESSION: Area of superficial soft tissue swelling in the left upper abdomen appears to correspond to fat herniation and edema. No loculated collections. Electronically Signed   By: Lucienne Capers M.D.   On: 04/21/2021 20:10      Subjective: Patient seen and examined at bedside.  Husband at bedside as well.  Patient does not want to go to SNF and feels that she is ready to go home today.  Denies any fever, nausea or vomiting.  Discharge Exam: Vitals:   04/26/21 0334 04/26/21 0757  BP: (!) 178/103 (!) 160/91  Pulse: (!) 101 100  Resp: 20 20  Temp: 98 F (36.7 C) 98.2 F (36.8 C)  SpO2: 100% 100%    General: Pt is alert, awake, not in acute distress.  Still extremely slow to respond and poor historian.  Currently on room air. Cardiovascular: Mildly tachycardic intermittently; S1/S2 + Respiratory: bilateral decreased breath sounds at bases Abdominal: Soft, obese, NT, ND, bowel sounds + Extremities: Bilateral lower extremity edema present; no cyanosis    The results of significant diagnostics from this hospitalization (including imaging, microbiology, ancillary and laboratory) are listed below for  reference.     Microbiology: Recent Results (from the past 240 hour(s))  Blood Culture (routine x 2)     Status: None   Collection Time: 04/16/21  7:05 PM   Specimen: BLOOD  Result Value Ref Range Status   Specimen Description BLOOD LEFT ANTECUBITAL  Final   Special Requests   Final    BOTTLES DRAWN AEROBIC AND ANAEROBIC Blood Culture results may not be optimal due to an inadequate volume of blood received in culture bottles   Culture   Final    NO GROWTH 5 DAYS Performed at [facility], [place] [address] [place], [place] [address]    Report Status 04/21/2021 FINAL  Final  Blood Culture (routine x 2)     Status: None   Collection Time: 04/16/21  7:05 PM   Specimen: BLOOD  Result Value Ref Range Status   Specimen Description BLOOD SITE NOT SPECIFIED  Final   Special Requests   Final    BOTTLES DRAWN AEROBIC AND ANAEROBIC Blood Culture results may not be optimal due to an inadequate volume of blood received in culture bottles   Culture   Final    NO GROWTH 5 DAYS Performed at [facility], [place] [address] [place], [place] [address]    Report Status 04/21/2021 FINAL  Final  Resp Panel by RT-PCR (Flu A&B, Covid) Nasopharyngeal Swab     Status: None   Collection Time: 04/16/21  7:35 PM   Specimen: Nasopharyngeal Swab; Nasopharyngeal(NP) swabs in vial transport medium  Result Value Ref Range Status   SARS Coronavirus 2 by RT PCR NEGATIVE NEGATIVE Final    Comment: (NOTE) SARS-CoV-2 target nucleic acids are NOT DETECTED.  The SARS-CoV-2 RNA is generally detectable in upper respiratory specimens during the acute phase of infection. The lowest concentration of SARS-CoV-2 viral copies this assay can detect is 138 copies/mL. A negative result does not preclude SARS-Cov-2 infection and should not be used as the sole basis for treatment or other patient management decisions. A negative result may occur with  improper specimen collection/handling, submission of specimen  other than nasopharyngeal swab, presence of viral mutation(s) within the areas targeted by this assay, and inadequate number of viral copies(<138 copies/mL). A negative result must be combined with clinical observations, patient history, and  epidemiological information. The expected result is Negative.  Fact Sheet for Patients:  EntrepreneurPulse.com.au  Fact Sheet for Healthcare Providers:  IncredibleEmployment.be  This test is no t yet approved or cleared by the [place] FDA and  has been authorized for detection and/or diagnosis of SARS-CoV-2 by FDA under an Emergency Use Authorization (EUA). This EUA will remain  in effect (meaning this test can be used) for the duration of the COVID-19 declaration under Section 564(b)(1) of the Act, 21 U.S.C.section 360bbb-3(b)(1), unless the authorization is terminated  or revoked sooner.       Influenza A by PCR NEGATIVE NEGATIVE Final   Influenza B by PCR NEGATIVE NEGATIVE Final    Comment: (NOTE) The Xpert Xpress SARS-CoV-2/FLU/RSV plus assay is intended as an aid in the diagnosis of influenza from Nasopharyngeal swab specimens and should not be used as a sole basis for treatment. Nasal washings and aspirates are unacceptable for Xpert Xpress SARS-CoV-2/FLU/RSV testing.  Fact Sheet for Patients: EntrepreneurPulse.com.au  Fact Sheet for Healthcare Providers: IncredibleEmployment.be  This test is not yet approved or cleared by the [place] FDA and has been authorized for detection and/or diagnosis of SARS-CoV-2 by FDA under an Emergency Use Authorization (EUA). This EUA will remain in effect (meaning this test can be used) for the duration of the COVID-19 declaration under Section 564(b)(1) of the Act, 21 U.S.C. section 360bbb-3(b)(1), unless the authorization is terminated or revoked.  Performed at [facility], [place] [address] [place],  [place] [address]   Aerobic/Anaerobic Culture w Gram Stain (surgical/deep wound)     Status: None   Collection Time: 04/17/21  8:43 AM   Specimen: Abscess  Result Value Ref Range Status   Specimen Description ABSCESS  Final   Special Requests NONE  Final   Gram Stain   Final    ABUNDANT WBC PRESENT, PREDOMINANTLY PMN FEW GRAM POSITIVE COCCI IN PAIRS IN CLUSTERS RARE GRAM POSITIVE RODS FEW GRAM NEGATIVE RODS    Culture   Final    RARE STAPHYLOCOCCUS EPIDERMIDIS FEW ACTINOMYCES NEUII Standardized susceptibility testing for this organism is not available. NO ANAEROBES ISOLATED Performed at [facility], [place] [address], [place], [place] [address]    Report Status 04/22/2021 FINAL  Final   Organism ID, Bacteria STAPHYLOCOCCUS EPIDERMIDIS  Final      Susceptibility   Staphylococcus epidermidis - MIC*    CIPROFLOXACIN <=0.5 SENSITIVE Sensitive     ERYTHROMYCIN >=8 RESISTANT Resistant     GENTAMICIN <=0.5 SENSITIVE Sensitive     OXACILLIN >=4 RESISTANT Resistant     TETRACYCLINE <=1 SENSITIVE Sensitive     VANCOMYCIN 2 SENSITIVE Sensitive     TRIMETH/SULFA <=10 SENSITIVE Sensitive     CLINDAMYCIN <=0.25 SENSITIVE Sensitive     RIFAMPIN <=0.5 SENSITIVE Sensitive     Inducible Clindamycin NEGATIVE Sensitive     * RARE STAPHYLOCOCCUS EPIDERMIDIS  Aerobic/Anaerobic Culture w Gram Stain (surgical/deep wound)     Status: None   Collection Time: 04/17/21  8:47 AM   Specimen: Other Source; Body Fluid  Result Value Ref Range Status   Specimen Description FLUID  Final   Special Requests RIGHT CHEST WALL ABS SPEC B  Final   Gram Stain   Final    ABUNDANT WBC PRESENT, PREDOMINANTLY PMN MODERATE GRAM POSITIVE COCCI IN PAIRS    Culture   Final    FEW ACTINOMYCES NEUII Standardized susceptibility testing for this organism is not available. NO ANAEROBES ISOLATED Performed at [facility], [address] [place], [place] [address]    Report Status 04/22/2021 FINAL  Final  MRSA Next  Gen by PCR, Nasal     Status: Abnormal   Collection Time: 04/21/21  6:04 PM   Specimen: Nasal Mucosa; Nasal Swab  Result Value Ref Range Status   MRSA by PCR Next Gen DETECTED (A) NOT DETECTED Final    Comment: RESULT CALLED TO, READ BACK BY AND VERIFIED WITH: PEACH FENSKE RN 04/21/2021 @2000  BY JW Performed at [facility], [place] [address] [place], [place] [address]      Labs: BNP (last 3 results) No results for input(s): BNP in the last 8760 hours. Basic Metabolic Panel: Recent Labs  Lab 04/20/21 0403 04/21/21 0304 04/22/21 1025 04/23/21 0155 04/24/21 0209 04/25/21 0221 04/26/21 0217  NA 132*   < > 137 139 140 141 139  K 4.2   < > 3.9 4.3 4.1 4.1 4.2  CL 104   < > 106 106 105 104 103  CO2 17*   < > 24 24 27 26 26   GLUCOSE 194*   < > 122* 75 118* 70 73  BUN 36*   < > 33* 31* 26* 23* 25*  CREATININE 4.27*   < > 2.99* 2.62* 2.18* 1.88* 1.93*  CALCIUM 8.0*   < > 8.3* 8.8* 8.9 9.1 9.0  MG 2.0  --   --   --   --   --  1.5*   < > = values in this interval not displayed.   Liver Function Tests: Recent Labs  Lab 04/24/21 0209  AST 47*  ALT 17  ALKPHOS 154*  BILITOT 0.4  PROT 6.6  ALBUMIN 1.7*   No results for input(s): LIPASE, AMYLASE in the last 168 hours. No results for input(s): AMMONIA in the last 168 hours. CBC: Recent Labs  Lab 04/20/21 0403 04/21/21 0304 04/22/21 8527 04/23/21 0155 04/24/21 0209 04/25/21 0221 04/26/21 0217  WBC 19.2*   < > 14.5* 15.4* 14.5* 14.3* 16.9*  NEUTROABS 14.2*  --   --   --   --  9.1* 11.1*  HGB 9.1*   < > 10.0* 10.6* 10.6* 10.4* 11.0*  HCT 24.9*   < > 29.3* 30.3* 30.8* 30.7* 31.7*  MCV 76.1*   < > 77.5* 77.9* 78.0* 78.1* 77.9*  PLT 473*   < > 511* 536* 516* 588* 601*   < > = values in this interval not displayed.   Cardiac Enzymes: Recent Labs  Lab 04/20/21 1746  CKTOTAL 207   BNP: Invalid input(s): POCBNP CBG: Recent Labs  Lab 04/25/21 1620 04/25/21 1634 04/25/21 2047 04/26/21 0330 04/26/21 0610   GLUCAP 69* 74 91 65* 111*   D-Dimer No results for input(s): DDIMER in the last 72 hours. Hgb A1c No results for input(s): HGBA1C in the last 72 hours. Lipid Profile No results for input(s): CHOL, HDL, LDLCALC, TRIG, CHOLHDL, LDLDIRECT in the last 72 hours. Thyroid function studies No results for input(s): TSH, T4TOTAL, T3FREE, THYROIDAB in the last 72 hours.  Invalid input(s): FREET3 Anemia work up No results for input(s): VITAMINB12, FOLATE, FERRITIN, TIBC, IRON, RETICCTPCT in the last 72 hours. Urinalysis    Component Value Date/Time   COLORURINE YELLOW 04/21/2021 0331   APPEARANCEUR CLOUDY (A) 04/21/2021 0331   LABSPEC 1.013 04/21/2021 0331   PHURINE 5.0 04/21/2021 0331   GLUCOSEU 50 (A) 04/21/2021 0331   GLUCOSEU > 1000 mg/dL (A) 07/07/2008 2115   HGBUR NEGATIVE 04/21/2021 0331  HGBUR negative 08/12/2008 1444   BILIRUBINUR NEGATIVE 04/21/2021 0331   BILIRUBINUR neg 02/07/2017 1609   KETONESUR NEGATIVE 04/21/2021 0331   PROTEINUR 100 (A) 04/21/2021 0331   UROBILINOGEN 1.0 02/07/2017 1609   UROBILINOGEN 0.2 11/11/2014 1658   NITRITE NEGATIVE 04/21/2021 0331   LEUKOCYTESUR NEGATIVE 04/21/2021 0331   Sepsis Labs Invalid input(s): PROCALCITONIN,  WBC,  LACTICIDVEN Microbiology Recent Results (from the past 240 hour(s))  Blood Culture (routine x 2)     Status: None   Collection Time: 04/16/21  7:05 PM   Specimen: BLOOD  Result Value Ref Range Status   Specimen Description BLOOD LEFT ANTECUBITAL  Final   Special Requests   Final    BOTTLES DRAWN AEROBIC AND ANAEROBIC Blood Culture results may not be optimal due to an inadequate volume of blood received in culture bottles   Culture   Final    NO GROWTH 5 DAYS Performed at [facility], [place] [address], [place], [place] [address]    Report Status 04/21/2021 FINAL  Final  Blood Culture (routine x 2)     Status: None   Collection Time: 04/16/21  7:05 PM   Specimen: BLOOD  Result Value Ref Range Status    Specimen Description BLOOD SITE NOT SPECIFIED  Final   Special Requests   Final    BOTTLES DRAWN AEROBIC AND ANAEROBIC Blood Culture results may not be optimal due to an inadequate volume of blood received in culture bottles   Culture   Final    NO GROWTH 5 DAYS Performed at [facility], [place] [address] [place], [place] [address]    Report Status 04/21/2021 FINAL  Final  Resp Panel by RT-PCR (Flu A&B, Covid) Nasopharyngeal Swab     Status: None   Collection Time: 04/16/21  7:35 PM   Specimen: Nasopharyngeal Swab; Nasopharyngeal(NP) swabs in vial transport medium  Result Value Ref Range Status   SARS Coronavirus 2 by RT PCR NEGATIVE NEGATIVE Final    Comment: (NOTE) SARS-CoV-2 target nucleic acids are NOT DETECTED.  The SARS-CoV-2 RNA is generally detectable in upper respiratory specimens during the acute phase of infection. The lowest concentration of SARS-CoV-2 viral copies this assay can detect is 138 copies/mL. A negative result does not preclude SARS-Cov-2 infection and should not be used as the sole basis for treatment or other patient management decisions. A negative result may occur with  improper specimen collection/handling, submission of specimen other than nasopharyngeal swab, presence of viral mutation(s) within the areas targeted by this assay, and inadequate number of viral copies(<138 copies/mL). A negative result must be combined with clinical observations, patient history, and epidemiological information. The expected result is Negative.  Fact Sheet for Patients:  EntrepreneurPulse.com.au  Fact Sheet for Healthcare Providers:  IncredibleEmployment.be  This test is no t yet approved or cleared by the [place] FDA and  has been authorized for detection and/or diagnosis of SARS-CoV-2 by FDA under an Emergency Use Authorization (EUA). This EUA will remain  in effect (meaning this test can be used) for the duration of  the COVID-19 declaration under Section 564(b)(1) of the Act, 21 U.S.C.section 360bbb-3(b)(1), unless the authorization is terminated  or revoked sooner.       Influenza A by PCR NEGATIVE NEGATIVE Final   Influenza B by PCR NEGATIVE NEGATIVE Final    Comment: (NOTE) The Xpert Xpress SARS-CoV-2/FLU/RSV plus assay is intended as an aid in the diagnosis of influenza from Nasopharyngeal swab specimens and should not be  used as a sole basis for treatment. Nasal washings and aspirates are unacceptable for Xpert Xpress SARS-CoV-2/FLU/RSV testing.  Fact Sheet for Patients: EntrepreneurPulse.com.au  Fact Sheet for Healthcare Providers: IncredibleEmployment.be  This test is not yet approved or cleared by the [place] FDA and has been authorized for detection and/or diagnosis of SARS-CoV-2 by FDA under an Emergency Use Authorization (EUA). This EUA will remain in effect (meaning this test can be used) for the duration of the COVID-19 declaration under Section 564(b)(1) of the Act, 21 U.S.C. section 360bbb-3(b)(1), unless the authorization is terminated or revoked.  Performed at [facility], [place] [address] [place], [place] [address]   Aerobic/Anaerobic Culture w Gram Stain (surgical/deep wound)     Status: None   Collection Time: 04/17/21  8:43 AM   Specimen: Abscess  Result Value Ref Range Status   Specimen Description ABSCESS  Final   Special Requests NONE  Final   Gram Stain   Final    ABUNDANT WBC PRESENT, PREDOMINANTLY PMN FEW GRAM POSITIVE COCCI IN PAIRS IN CLUSTERS RARE GRAM POSITIVE RODS FEW GRAM NEGATIVE RODS    Culture   Final    RARE STAPHYLOCOCCUS EPIDERMIDIS FEW ACTINOMYCES NEUII Standardized susceptibility testing for this organism is not available. NO ANAEROBES ISOLATED Performed at [facility], [place] [address], [place], [place] [address]    Report Status 04/22/2021 FINAL  Final   Organism ID, Bacteria  STAPHYLOCOCCUS EPIDERMIDIS  Final      Susceptibility   Staphylococcus epidermidis - MIC*    CIPROFLOXACIN <=0.5 SENSITIVE Sensitive     ERYTHROMYCIN >=8 RESISTANT Resistant     GENTAMICIN <=0.5 SENSITIVE Sensitive     OXACILLIN >=4 RESISTANT Resistant     TETRACYCLINE <=1 SENSITIVE Sensitive     VANCOMYCIN 2 SENSITIVE Sensitive     TRIMETH/SULFA <=10 SENSITIVE Sensitive     CLINDAMYCIN <=0.25 SENSITIVE Sensitive     RIFAMPIN <=0.5 SENSITIVE Sensitive     Inducible Clindamycin NEGATIVE Sensitive     * RARE STAPHYLOCOCCUS EPIDERMIDIS  Aerobic/Anaerobic Culture w Gram Stain (surgical/deep wound)     Status: None   Collection Time: 04/17/21  8:47 AM   Specimen: Other Source; Body Fluid  Result Value Ref Range Status   Specimen Description FLUID  Final   Special Requests RIGHT CHEST WALL ABS SPEC B  Final   Gram Stain   Final    ABUNDANT WBC PRESENT, PREDOMINANTLY PMN MODERATE GRAM POSITIVE COCCI IN PAIRS    Culture   Final    FEW ACTINOMYCES NEUII Standardized susceptibility testing for this organism is not available. NO ANAEROBES ISOLATED Performed at [facility], [place] [address] [place], [place] [address]    Report Status 04/22/2021 FINAL  Final  MRSA Next Gen by PCR, Nasal     Status: Abnormal   Collection Time: 04/21/21  6:04 PM   Specimen: Nasal Mucosa; Nasal Swab  Result Value Ref Range Status   MRSA by PCR Next Gen DETECTED (A) NOT DETECTED Final    Comment: RESULT CALLED TO, READ BACK BY AND VERIFIED WITH: PEACH FENSKE RN 04/21/2021 @2000  BY JW Performed at [facility], [place] [address], [place], [place] [address]      Time coordinating discharge: 35 minutes  SIGNED:   Aline August, MD  [facility] 04/26/2021, 10:08 AM

## 2021-04-26 NOTE — TOC Transition Note (Signed)
Transition of Care [facility]) - CM/SW Discharge Note   Patient Details  Name: Shirley [name] MRN: [id number] Date of Birth: 07-28-1975  [facility] CM/SW Contact:  Pollie Friar, RN Phone Number: 04/26/2021, 10:40 AM   Clinical Narrative:    Patient is discharging home today with home health services through [facility]. Levada Dy with [facility] aware of d/c.  Pt unable to get a walker for d/c as she has received one in the last 5 years. Pt states she has lost it through multiple moves. She states she can get one from her mother.  Pt has support for dressing changes at home and transportation to home.    Final next level of care: [place] Barriers to Discharge: No Barriers Identified   Patient Goals and CMS Choice Patient states their goals for this hospitalization and ongoing recovery are:: Pt states her goal is to be able to return home with her significant other. CMS Medicare.gov Compare Post Acute Care list provided to:: Patient Choice offered to / list presented to : Patient  Discharge Placement                       Discharge Plan and Services   Discharge Planning Services: CM Consult Post Acute Care Choice: Home Health                    HH Arranged: RN, PT, OT [facility] Agency: [place] Date [place]: 04/26/21   Representative spoke with at [place]: [place] Interventions     Readmission Risk Interventions No flowsheet data found.

## 2021-04-26 NOTE — Progress Notes (Addendum)
9 Days Post-Op   Subjective/Chief Complaint: Asked to relook at chest wall wound prior to discharge.     Objective: Vital signs in last 24 hours: Temp:  [97.5 F (36.4 C)-98.2 F (36.8 C)] 98.2 F (36.8 C) (01/03 0757) Pulse Rate:  [91-101] 100 (01/03 0757) Resp:  [16-20] 20 (01/03 0757) BP: (152-178)/(85-112) 160/91 (01/03 0757) SpO2:  [100 %] 100 % (01/03 0757) Weight:  [113.3 kg] 113.3 kg (01/03 0500) Last BM Date: 04/21/21  Intake/Output from previous day: 01/02 0701 - 01/03 0700 In: 780 [P.O.:780] Out: -  Intake/Output this shift: No intake/output data recorded.  Exam: Awake and alert RUQ abscess site clean with 100% beefy red granulation tissue, induration decreasing   Lab Results:  Recent Labs    04/25/21 0221 04/26/21 0217  WBC 14.3* 16.9*  HGB 10.4* 11.0*  HCT 30.7* 31.7*  PLT 588* 601*   BMET Recent Labs    04/25/21 0221 04/26/21 0217  NA 141 139  K 4.1 4.2  CL 104 103  CO2 26 26  GLUCOSE 70 73  BUN 23* 25*  CREATININE 1.88* 1.93*  CALCIUM 9.1 9.0   PT/INR No results for input(s): LABPROT, INR in the last 72 hours. ABG No results for input(s): PHART, HCO3 in the last 72 hours.  Invalid input(s): PCO2, PO2  Studies/Results: No results found.  Anti-infectives: Anti-infectives (From admission, onward)    Start     Dose/Rate Route Frequency Ordered Stop   04/22/21 1545  doxycycline (VIBRA-TABS) tablet 100 mg        100 mg Oral Every 12 hours 04/22/21 1454     04/21/21 1400  piperacillin-tazobactam (ZOSYN) IVPB 3.375 g  Status:  Discontinued        3.375 g 12.5 mL/hr over 240 Minutes Intravenous Every 8 hours 04/21/21 1243 04/22/21 1454   04/19/21 2200  piperacillin-tazobactam (ZOSYN) IVPB 2.25 g  Status:  Discontinued        2.25 g 100 mL/hr over 30 Minutes Intravenous Every 8 hours 04/19/21 1840 04/21/21 1243   04/19/21 0815  doxycycline (VIBRAMYCIN) 100 mg in sodium chloride 0.9 % 250 mL IVPB  Status:  Discontinued        100  mg 125 mL/hr over 120 Minutes Intravenous Every 12 hours 04/19/21 0729 04/22/21 1454   04/18/21 1000  Vancomycin (VANCOCIN) 1,250 mg in sodium chloride 0.9 % 250 mL IVPB  Status:  Discontinued        1,250 mg 166.7 mL/hr over 90 Minutes Intravenous Every 36 hours 04/16/21 2101 04/18/21 0127   04/18/21 1000  vancomycin (VANCOREADY) IVPB 1250 mg/250 mL  Status:  Discontinued        1,250 mg 166.7 mL/hr over 90 Minutes Intravenous Every 36 hours 04/18/21 0127 04/18/21 1103   04/17/21 0600  piperacillin-tazobactam (ZOSYN) IVPB 3.375 g  Status:  Discontinued        3.375 g 12.5 mL/hr over 240 Minutes Intravenous Every 8 hours 04/16/21 2059 04/19/21 1840   04/16/21 2000  vancomycin (VANCOCIN) 1,750 mg in sodium chloride 0.9 % 500 mL IVPB        1,750 mg 250 mL/hr over 120 Minutes Intravenous  Once 04/16/21 1947 04/17/21 0038   04/16/21 2000  piperacillin-tazobactam (ZOSYN) IVPB 3.375 g        3.375 g 100 mL/hr over 30 Minutes Intravenous  Once 04/16/21 1947 04/16/21 2059       Assessment/Plan: POD #9 s/p I&D of R chest wall abscess by Dr. Bobbye Morton - 04/17/2021 -WD  dressing changes at home, daily should be adequate -abx per ID -wound is 100% clean -stable for DC home -our office is arranging follow up -no medication likely needed at this point for her wound, but she is prescribed 145 of percocet 10/325 per month at home.  She can take this as needed.    Henreitta Cea PA-C 04/26/2021

## 2021-04-26 NOTE — Discharge Instructions (Signed)
MIDLINE WOUND CARE: °- midline dressing to be changed twice daily °- supplies: sterile saline, kerlix, scissors, ABD pads, tape  °- remove dressing and all packing carefully, moistening with sterile saline as needed to avoid packing/internal dressing sticking to the wound. °- clean edges of skin around the wound with water/gauze, making sure there is no tape debris or leakage left on skin that could cause skin irritation or breakdown. °- dampen and clean kerlix with sterile saline and pack wound from wound base to skin level, making sure to take note of any possible areas of wound tracking, tunneling and packing appropriately. Wound can be packed loosely. Trim kerlix to size if a whole kerlix is not required. °- cover wound with a dry ABD pad and secure with tape.  °- write the date/time on the dry dressing/tape to better track when the last dressing change occurred. °- apply any skin protectant/powder recommended by clinician to protect skin/skin folds. °- change dressing as needed if leakage occurs, wound gets contaminated, or patient requests to shower. °- patient may shower daily with wound open and following the shower the wound should be dried and a clean dressing placed.  ° °

## 2021-05-16 ENCOUNTER — Other Ambulatory Visit: Payer: Self-pay | Admitting: Physician Assistant

## 2021-05-16 DIAGNOSIS — R102 Pelvic and perineal pain: Secondary | ICD-10-CM

## 2021-05-16 DIAGNOSIS — N611 Abscess of the breast and nipple: Secondary | ICD-10-CM

## 2021-05-19 ENCOUNTER — Other Ambulatory Visit: Payer: Medicare Other

## 2021-05-20 ENCOUNTER — Inpatient Hospital Stay: Payer: Medicare Other | Admitting: Internal Medicine

## 2021-05-24 ENCOUNTER — Other Ambulatory Visit: Payer: Self-pay

## 2021-05-24 ENCOUNTER — Ambulatory Visit
Admission: RE | Admit: 2021-05-24 | Discharge: 2021-05-24 | Disposition: A | Discharge status: Home / Self Care | Payer: Medicare Other | Source: Ambulatory Visit | Attending: Physician Assistant | Admitting: Physician Assistant

## 2021-05-24 DIAGNOSIS — R102 Pelvic and perineal pain: Secondary | ICD-10-CM

## 2021-06-01 ENCOUNTER — Other Ambulatory Visit: Payer: Self-pay

## 2021-06-01 ENCOUNTER — Emergency Department (HOSPITAL_COMMUNITY)
Admission: EM | Admit: 2021-06-01 | Discharge: 2021-06-01 | Disposition: A | Discharge status: Home / Self Care | Payer: Medicare Other | Attending: Emergency Medicine | Admitting: Emergency Medicine

## 2021-06-01 ENCOUNTER — Encounter (HOSPITAL_COMMUNITY): Payer: Self-pay | Admitting: *Deleted

## 2021-06-01 DIAGNOSIS — R109 Unspecified abdominal pain: Secondary | ICD-10-CM | POA: Diagnosis not present

## 2021-06-01 DIAGNOSIS — Z5321 Procedure and treatment not carried out due to patient leaving prior to being seen by health care provider: Secondary | ICD-10-CM | POA: Insufficient documentation

## 2021-06-01 DIAGNOSIS — R112 Nausea with vomiting, unspecified: Secondary | ICD-10-CM | POA: Insufficient documentation

## 2021-06-01 LAB — CBC WITH DIFFERENTIAL/PLATELET
Abs Immature Granulocytes: 0.06 10*3/uL (ref 0.00–0.07)
Basophils Absolute: 0.1 10*3/uL (ref 0.0–0.1)
Basophils Relative: 0 %
Eosinophils Absolute: 0.2 10*3/uL (ref 0.0–0.5)
Eosinophils Relative: 1 %
HCT: 36 % (ref 36.0–46.0)
Hemoglobin: 12.3 g/dL (ref 12.0–15.0)
Immature Granulocytes: 1 %
Lymphocytes Relative: 24 %
Lymphs Abs: 3 10*3/uL (ref 0.7–4.0)
MCH: 26.8 pg (ref 26.0–34.0)
MCHC: 34.2 g/dL (ref 30.0–36.0)
MCV: 78.4 fL — ABNORMAL LOW (ref 80.0–100.0)
Monocytes Absolute: 0.8 10*3/uL (ref 0.1–1.0)
Monocytes Relative: 6 %
Neutro Abs: 8.7 10*3/uL — ABNORMAL HIGH (ref 1.7–7.7)
Neutrophils Relative %: 68 %
Platelets: 439 10*3/uL — ABNORMAL HIGH (ref 150–400)
RBC: 4.59 MIL/uL (ref 3.87–5.11)
RDW: 15.3 % (ref 11.5–15.5)
WBC: 12.7 10*3/uL — ABNORMAL HIGH (ref 4.0–10.5)
nRBC: 0 % (ref 0.0–0.2)

## 2021-06-01 LAB — COMPREHENSIVE METABOLIC PANEL
ALT: 7 U/L (ref 0–44)
AST: 15 U/L (ref 15–41)
Albumin: 2.3 g/dL — ABNORMAL LOW (ref 3.5–5.0)
Alkaline Phosphatase: 72 U/L (ref 38–126)
Anion gap: 10 (ref 5–15)
BUN: 15 mg/dL (ref 6–20)
CO2: 24 mmol/L (ref 22–32)
Calcium: 8.8 mg/dL — ABNORMAL LOW (ref 8.9–10.3)
Chloride: 104 mmol/L (ref 98–111)
Creatinine, Ser: 1.61 mg/dL — ABNORMAL HIGH (ref 0.44–1.00)
GFR, Estimated: 39 mL/min — ABNORMAL LOW (ref >60)
Glucose, Bld: 186 mg/dL — ABNORMAL HIGH (ref 70–99)
Potassium: 3.7 mmol/L (ref 3.5–5.1)
Sodium: 138 mmol/L (ref 135–145)
Total Bilirubin: 0.6 mg/dL (ref 0.3–1.2)
Total Protein: 6.6 g/dL (ref 6.5–8.1)

## 2021-06-01 LAB — LIPASE, BLOOD: Lipase: 29 U/L (ref 11–51)

## 2021-06-01 LAB — I-STAT BETA HCG BLOOD, ED (MC, WL, AP ONLY): I-stat hCG, quantitative: <5 m[IU]/mL (ref <5)

## 2021-06-01 MED ORDER — ONDANSETRON 4 MG PO TBDP
4.0000 mg | ORAL_TABLET | Freq: Once | ORAL | Status: AC
  Administered 2021-06-01: 4 mg via ORAL
  Filled 2021-06-01: qty 1

## 2021-06-01 NOTE — ED Provider Triage Note (Signed)
Right-sided flank pain since last night emergency Medicine Provider Triage Evaluation Note  Shirley [name] , a 46 y.o. female  was evaluated in triage.  Pt complains of right-sided flank pain since last night.  Patient states that she had sudden onset right-sided flank pain, denies any recent sexual encounters, denies fevers.  Endorses issues with urination.  Review of Systems  Positive: Right-sided flank pain, nausea, vomiting, hot flashes, difficulty urinating Negative: Fevers, chest pain, shortness of breath  Physical Exam  BP (!) 211/126    Pulse (!) 104    Temp 99.3 F (37.4 C)    Resp (!) 26    Ht 5\' 7"  (1.702 m)    Wt 113.3 kg    SpO2 93%    BMI 39.12 kg/m  Gen:   Awake, no distress   Resp:  Normal effort  MSK:   Moves extremities without difficulty  Other:  R sided CVA tenderness  Medical Decision Making  Medically screening exam initiated at 4:32 PM.  Appropriate orders placed.  Shirley [name] was informed that the remainder of the evaluation will be completed by another provider, this initial triage assessment does not replace that evaluation, and the importance of remaining in the ED until their evaluation is complete.     Azucena Cecil, PA-C 06/01/21 [phone]

## 2021-06-01 NOTE — ED Notes (Signed)
Pt name called for updated vitals, no response 

## 2021-06-01 NOTE — ED Triage Notes (Signed)
The pt is c/o rt flank pain with n and vomiting for 3-4 days  lmp  december

## 2021-06-16 ENCOUNTER — Inpatient Hospital Stay: Payer: Medicare Other | Admitting: Internal Medicine

## 2021-06-28 ENCOUNTER — Inpatient Hospital Stay: Payer: Medicare Other | Admitting: Internal Medicine

## 2021-06-28 ENCOUNTER — Telehealth: Payer: Self-pay

## 2021-06-28 NOTE — Telephone Encounter (Signed)
Called patient to reschedule missed appointment this morning. Pt's daughter answered the phone, stating patient was not available at this time. Communicated HIPAA-compliant message requesting a call back to reschedule. ? ?Binnie Kand, RN  ?

## 2021-08-20 ENCOUNTER — Emergency Department (HOSPITAL_COMMUNITY)
Admission: EM | Admit: 2021-08-20 | Discharge: 2021-08-20 | Disposition: A | Discharge status: Home / Self Care | Payer: Medicare Other | Attending: Emergency Medicine | Admitting: Emergency Medicine

## 2021-08-20 ENCOUNTER — Encounter (HOSPITAL_COMMUNITY): Payer: Self-pay | Admitting: Emergency Medicine

## 2021-08-20 DIAGNOSIS — R7989 Other specified abnormal findings of blood chemistry: Secondary | ICD-10-CM | POA: Diagnosis not present

## 2021-08-20 DIAGNOSIS — Z794 Long term (current) use of insulin: Secondary | ICD-10-CM | POA: Insufficient documentation

## 2021-08-20 DIAGNOSIS — D72829 Elevated white blood cell count, unspecified: Secondary | ICD-10-CM | POA: Diagnosis not present

## 2021-08-20 DIAGNOSIS — R109 Unspecified abdominal pain: Secondary | ICD-10-CM | POA: Insufficient documentation

## 2021-08-20 DIAGNOSIS — R112 Nausea with vomiting, unspecified: Secondary | ICD-10-CM | POA: Diagnosis not present

## 2021-08-20 LAB — URINALYSIS, ROUTINE W REFLEX MICROSCOPIC
Bilirubin Urine: NEGATIVE
Glucose, UA: NEGATIVE mg/dL
Hgb urine dipstick: NEGATIVE
Ketones, ur: NEGATIVE mg/dL
Leukocytes,Ua: NEGATIVE
Nitrite: NEGATIVE
Protein, ur: >=300 mg/dL — AB
Specific Gravity, Urine: 1.029 (ref 1.005–1.030)
pH: 5 (ref 5.0–8.0)

## 2021-08-20 LAB — COMPREHENSIVE METABOLIC PANEL
ALT: 7 U/L (ref 0–44)
AST: 17 U/L (ref 15–41)
Albumin: 2.8 g/dL — ABNORMAL LOW (ref 3.5–5.0)
Alkaline Phosphatase: 52 U/L (ref 38–126)
Anion gap: 9 (ref 5–15)
BUN: 24 mg/dL — ABNORMAL HIGH (ref 6–20)
CO2: 24 mmol/L (ref 22–32)
Calcium: 9 mg/dL (ref 8.9–10.3)
Chloride: 108 mmol/L (ref 98–111)
Creatinine, Ser: 2.48 mg/dL — ABNORMAL HIGH (ref 0.44–1.00)
GFR, Estimated: 24 mL/min — ABNORMAL LOW (ref >60)
Glucose, Bld: 110 mg/dL — ABNORMAL HIGH (ref 70–99)
Potassium: 3.5 mmol/L (ref 3.5–5.1)
Sodium: 141 mmol/L (ref 135–145)
Total Bilirubin: 0.3 mg/dL (ref 0.3–1.2)
Total Protein: 6.7 g/dL (ref 6.5–8.1)

## 2021-08-20 LAB — CBC
HCT: 36.7 % (ref 36.0–46.0)
Hemoglobin: 12.9 g/dL (ref 12.0–15.0)
MCH: 27.3 pg (ref 26.0–34.0)
MCHC: 35.1 g/dL (ref 30.0–36.0)
MCV: 77.6 fL — ABNORMAL LOW (ref 80.0–100.0)
Platelets: 468 10*3/uL — ABNORMAL HIGH (ref 150–400)
RBC: 4.73 MIL/uL (ref 3.87–5.11)
RDW: 14.6 % (ref 11.5–15.5)
WBC: 19 10*3/uL — ABNORMAL HIGH (ref 4.0–10.5)
nRBC: 0 % (ref 0.0–0.2)

## 2021-08-20 LAB — LIPASE, BLOOD: Lipase: 55 U/L — ABNORMAL HIGH (ref 11–51)

## 2021-08-20 LAB — I-STAT BETA HCG BLOOD, ED (MC, WL, AP ONLY): I-stat hCG, quantitative: 5.4 m[IU]/mL — ABNORMAL HIGH (ref <5)

## 2021-08-20 MED ORDER — METOCLOPRAMIDE HCL 5 MG/ML IJ SOLN
10.0000 mg | Freq: Once | INTRAMUSCULAR | Status: AC
  Administered 2021-08-20: 10 mg via INTRAVENOUS
  Filled 2021-08-20: qty 2

## 2021-08-20 MED ORDER — DICYCLOMINE HCL 10 MG PO CAPS
10.0000 mg | ORAL_CAPSULE | Freq: Once | ORAL | Status: AC
  Administered 2021-08-20: 10 mg via ORAL
  Filled 2021-08-20: qty 1

## 2021-08-20 MED ORDER — METOCLOPRAMIDE HCL 10 MG PO TABS: 10.0000 mg | ORAL_TABLET | Freq: Three times a day (TID) | ORAL | 1 refills | Status: DC

## 2021-08-20 MED ORDER — METOPROLOL SUCCINATE ER 25 MG PO TB24
50.0000 mg | ORAL_TABLET | Freq: Every day | ORAL | Status: DC
  Administered 2021-08-20: 50 mg via ORAL
  Filled 2021-08-20: qty 2

## 2021-08-20 MED ORDER — DICYCLOMINE HCL 10 MG PO CAPS
10.0000 mg | ORAL_CAPSULE | Freq: Three times a day (TID) | ORAL | 0 refills | Status: DC
Start: 2021-08-20 — End: 2023-04-20

## 2021-08-20 MED ORDER — DIPHENHYDRAMINE HCL 50 MG/ML IJ SOLN
12.5000 mg | Freq: Once | INTRAMUSCULAR | Status: AC
  Administered 2021-08-20: 12.5 mg via INTRAVENOUS
  Filled 2021-08-20: qty 1

## 2021-08-20 MED ORDER — SODIUM CHLORIDE 0.9 % IV BOLUS
1000.0000 mL | Freq: Once | INTRAVENOUS | Status: AC
Start: 2021-08-20 — End: 2021-08-20
  Administered 2021-08-20: 1000 mL via INTRAVENOUS

## 2021-08-20 MED ORDER — AMLODIPINE BESYLATE 5 MG PO TABS
10.0000 mg | ORAL_TABLET | Freq: Every day | ORAL | Status: DC
  Administered 2021-08-20: 10 mg via ORAL
  Filled 2021-08-20: qty 2

## 2021-08-20 MED ORDER — LACTATED RINGERS IV BOLUS
1000.0000 mL | Freq: Once | INTRAVENOUS | Status: AC
  Administered 2021-08-20: 1000 mL via INTRAVENOUS

## 2021-08-20 MED ORDER — ONDANSETRON 4 MG PO TBDP
4.0000 mg | ORAL_TABLET | Freq: Once | ORAL | Status: AC | PRN
  Administered 2021-08-20: 4 mg via ORAL
  Filled 2021-08-20: qty 1

## 2021-08-20 MED ORDER — LOPERAMIDE HCL 2 MG PO CAPS
2.0000 mg | ORAL_CAPSULE | Freq: Once | ORAL | Status: AC
  Administered 2021-08-20: 2 mg via ORAL
  Filled 2021-08-20: qty 1

## 2021-08-20 NOTE — Discharge Instructions (Addendum)
Return if any problems.

## 2021-08-20 NOTE — ED Notes (Signed)
Patient ambulated in room maintaining O2 saturation of 93-96% without a nasal cannula applied. Patient reported no shortness of breath. ?

## 2021-08-20 NOTE — ED Triage Notes (Signed)
Pt reported to ED with c/o severe nausea and vomiting and abdominal pain. States that "her gastroparesis is acting up".  States symptoms have been ongoing x2 weeks. Has not kept record of blood glucose.  ?

## 2021-08-21 NOTE — ED Provider Notes (Signed)
?[facility] ?Provider Note ? ? ?CSN: [id number] ?Arrival date & time: 08/20/21  0228 ? ?  ? ?History ? ?Chief Complaint  ?Patient presents with  ? Emesis  ? ? ?Shirley [name] is a 46 y.o. female. ? ?Pt reports she has a history of gastroparesis and feels like she is having an exacerbation.  Pt reports abdominal discomfort.  And vomiting.  Pt denies fever or chills.  No exposure to illness ? ?The history is provided by the patient. No language interpreter was used.  ?Emesis ?Severity:  Moderate ?Duration:  1 day ?Timing:  Constant ?Number of daily episodes:  Multipe ?Quality:  Undigested food ?Progression:  Worsening ?Chronicity:  New ?Recent urination:  Normal ?Relieved by:  Nothing ?Worsened by:  Nothing ?Ineffective treatments:  None tried ?Associated symptoms: no chills and no fever   ?Risk factors: no sick contacts   ? ?  ? ?Home Medications ?Prior to Admission medications   ?Medication Sig Start Date End Date Taking? Authorizing Provider  ?acetaminophen (TYLENOL) 500 MG tablet Take 1,000 mg by mouth every 6 (six) hours as needed for moderate pain.   Yes [provider]  ?albuterol (VENTOLIN HFA) 108 (90 Base) MCG/ACT inhaler Inhale 2 puffs into the lungs every 4 (four) hours as needed for wheezing or shortness of breath. 11/30/19  Yes Domenic Polite, MD  ?amLODipine (NORVASC) 10 MG tablet Take 1 tablet (10 mg total) by mouth daily. 04/26/21  Yes Aline August, MD  ?atorvastatin (LIPITOR) 20 MG tablet Take 20 mg by mouth at bedtime. 04/11/21  Yes [provider]  ?blood glucose meter kit and supplies KIT Dispense based on patient and insurance preference. Use up to four times daily as directed. (FOR ICD-9 250.00, 250.01). 01/04/20  Yes Khatri, Hina, PA-C  ?cyclobenzaprine (FLEXERIL) 10 MG tablet Take 10 mg by mouth 3 (three) times daily as needed for muscle spasms.   Yes [provider]  ?dicyclomine (BENTYL) 10 MG capsule Take 1 capsule (10 mg  total) by mouth 4 (four) times daily -  before meals and at bedtime. 08/20/21  Yes Caryl Ada K, PA-C  ?diphenhydrAMINE (BENADRYL) 25 MG tablet Take 25 mg by mouth every 6 (six) hours as needed for allergies.   Yes [provider]  ?famotidine (PEPCID) 20 MG tablet Take 20 mg by mouth every evening. 04/11/21  Yes [provider]  ?gabapentin (NEURONTIN) 400 MG capsule Take 1 capsule (400 mg total) by mouth daily at 6 (six) AM. 04/26/21  Yes Aline August, MD  ?HUMALOG KWIKPEN 100 UNIT/ML KwikPen Inject 15 Units into the skin 4 (four) times daily -  before meals and at bedtime. Hold if blood glucose is less than 110 11/11/19  Yes [provider]  ?metoCLOPramide (REGLAN) 10 MG tablet Take 1 tablet (10 mg total) by mouth 3 (three) times daily with meals. 08/20/21 08/20/22 Yes Fransico Meadow, PA-C  ?metoprolol succinate (TOPROL-XL) 50 MG 24 hr tablet Take 50 mg by mouth daily. 05/16/21  Yes [provider]  ?MOUNJARO 5 MG/0.5ML Pen Inject 0.5 mLs into the skin once a week. 06/15/21  Yes [provider]  ?omeprazole (PRILOSEC) 40 MG capsule Take 40 mg by mouth daily. 03/10/21  Yes [provider]  ?Blood Glucose Monitoring Suppl (ONETOUCH VERIO REFLECT) w/Device KIT 1 each by Does not apply route 3 (three) times daily. 12/04/18   Kerin Perna, NP  ?furosemide (LASIX) 40 MG tablet Take 1 tablet (40 mg total) by  mouth daily for 7 days. 04/26/21 05/03/21  Aline August, MD  ?glucose blood (ONETOUCH VERIO) test strip Use as instructed to check blood sugar 3 times daily. Dx codes: E11.8, E11.65 24-Dec-2018   Charlott Rakes, MD  ?Insulin Pen Needle (PEN NEEDLES 3/16") 31G X 5 MM MISC 1 Device by Does not apply route 4 (four) times daily - after meals and at bedtime. 11/28/18   Kerin Perna, NP  ?INSULIN SYRINGE .5CC/28G (INS SYRINGE/NEEDLE .5CC/28G) 28G X 1/2" 0.5 ML MISC Please provide 1 month supply 09/04/17   Colbert Ewing, MD  ?meclizine (ANTIVERT) 25 MG tablet  Take 1 tablet (25 mg total) by mouth 3 (three) times daily as needed for dizziness. ?Patient not taking: Reported on 08/20/2021 04/26/21   Aline August, MD  ?methocarbamol (ROBAXIN) 500 MG tablet Take 1 tablet (500 mg total) by mouth every 6 (six) hours as needed for muscle spasms. ?Patient not taking: Reported on 08/20/2021 04/26/21   Aline August, MD  ?Jonetta Speak Lancets 07W MISC Use to check blood sugar 3 times daily. Dx codes: E11.8, E11.65 12-24-18   Charlott Rakes, MD  ?polyethylene glycol powder (GLYCOLAX/MIRALAX) 17 GM/SCOOP powder Take 17 g by mouth daily as needed. ?Patient not taking: Reported on 08/20/2021 04/26/21   Aline August, MD  ?senna (SENOKOT) 8.6 MG TABS tablet Take 1 tablet (8.6 mg total) by mouth 2 (two) times daily. ?Patient not taking: Reported on 08/20/2021 04/26/21   Aline August, MD  ?   ? ?Allergies    ?Lisinopril and Morphine and related   ? ?Review of Systems   ?Review of Systems  ?Constitutional:  Negative for chills and fever.  ?Gastrointestinal:  Positive for vomiting.  ?All other systems reviewed and are negative. ? ?Physical Exam ?Updated Vital Signs ?BP (!) 159/90   Pulse 96   Temp 97.9 ?F (36.6 ?C) (Oral)   Resp 17   SpO2 98%  ?Physical Exam ?Vitals and nursing note reviewed.  ?Constitutional:   ?   Appearance: She is well-developed.  ?HENT:  ?   Head: Normocephalic.  ?   Mouth/Throat:  ?   Mouth: Mucous membranes are moist.  ?Eyes:  ?   Conjunctiva/sclera: Conjunctivae normal.  ?Cardiovascular:  ?   Rate and Rhythm: Normal rate and regular rhythm.  ?Pulmonary:  ?   Effort: Pulmonary effort is normal.  ?   Breath sounds: Normal breath sounds.  ?Abdominal:  ?   General: Abdomen is flat. There is no distension.  ?   Tenderness: There is abdominal tenderness.  ?Musculoskeletal:     ?   General: Normal range of motion.  ?   Cervical back: Normal range of motion.  ?Skin: ?   General: Skin is warm.  ?Neurological:  ?   General: No focal deficit present.  ?   Mental Status: She is  alert and oriented to person, place, and time.  ?Psychiatric:     ?   Mood and Affect: Mood normal.  ? ? ?ED Results / Procedures / Treatments   ?Labs ?(all labs ordered are listed, but only abnormal results are displayed) ?Labs Reviewed  ?LIPASE, BLOOD - Abnormal; Notable for the following components:  ?    Result Value  ? Lipase 55 (*)   ? All other components within normal limits  ?COMPREHENSIVE METABOLIC PANEL - Abnormal; Notable for the following components:  ? Glucose, Bld 110 (*)   ? BUN 24 (*)   ? Creatinine, Ser 2.48 (*)   ? Albumin 2.8 (*)   ?  GFR, Estimated 24 (*)   ? All other components within normal limits  ?CBC - Abnormal; Notable for the following components:  ? WBC 19.0 (*)   ? MCV 77.6 (*)   ? Platelets 468 (*)   ? All other components within normal limits  ?URINALYSIS, ROUTINE W REFLEX MICROSCOPIC - Abnormal; Notable for the following components:  ? Color, Urine AMBER (*)   ? APPearance CLOUDY (*)   ? Protein, ur >=300 (*)   ? Bacteria, UA RARE (*)   ? All other components within normal limits  ?I-STAT BETA HCG BLOOD, ED ([facility], [facility], [facility] ONLY) - Abnormal; Notable for the following components:  ? I-stat hCG, quantitative 5.4 (*)   ? All other components within normal limits  ? ? ?EKG ?EKG Interpretation ? ?Date/Time:  Saturday August 20 2021 06:56:39 EDT ?Ventricular Rate:  123 ?PR Interval:  114 ?QRS Duration: 81 ?QT Interval:  334 ?QTC Calculation: 478 ?R Axis:   50 ?Text Interpretation: Sinus tachycardia Ventricular premature complex Aberrant complex Probable left atrial enlargement Borderline T wave abnormalities No significant change since last tracing Confirmed by Isla Pence [id number]) on 08/20/2021 7:07:44 AM ? ?Radiology ?No results found. ? ?Procedures ?Procedures  ? ? ?Medications Ordered in ED ?Medications  ?ondansetron (ZOFRAN-ODT) disintegrating tablet 4 mg (4 mg Oral Given 08/20/21 0322)  ?lactated ringers bolus 1,000 mL (0 mLs Intravenous Stopped 08/20/21 1528)  ?metoCLOPramide (REGLAN)  injection 10 mg (10 mg Intravenous Given 08/20/21 0811)  ?diphenhydrAMINE (BENADRYL) injection 12.5 mg (12.5 mg Intravenous Given 08/20/21 0811)  ?sodium chloride 0.9 % bolus 1,000 mL (0 mLs Intravenous Stopped 4/29
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
## 2021-11-25 ENCOUNTER — Other Ambulatory Visit: Payer: Self-pay | Admitting: Geriatric Medicine

## 2021-11-25 ENCOUNTER — Other Ambulatory Visit (HOSPITAL_COMMUNITY): Payer: Self-pay | Admitting: Geriatric Medicine

## 2021-11-25 DIAGNOSIS — R112 Nausea with vomiting, unspecified: Secondary | ICD-10-CM

## 2021-12-20 ENCOUNTER — Encounter (HOSPITAL_COMMUNITY): Payer: Self-pay

## 2021-12-20 ENCOUNTER — Ambulatory Visit (HOSPITAL_COMMUNITY): Payer: Medicare Other

## 2022-03-20 ENCOUNTER — Emergency Department (HOSPITAL_COMMUNITY): Payer: Medicare Other

## 2022-03-20 ENCOUNTER — Inpatient Hospital Stay (HOSPITAL_COMMUNITY)
Admission: EM | Admit: 2022-03-20 | Discharge: 2022-03-24 | DRG: 176 | Disposition: A | Discharge status: Home / Self Care | Payer: Medicare Other | Attending: Family Medicine | Admitting: Family Medicine

## 2022-03-20 ENCOUNTER — Other Ambulatory Visit: Payer: Self-pay

## 2022-03-20 DIAGNOSIS — F1510 Other stimulant abuse, uncomplicated: Secondary | ICD-10-CM | POA: Diagnosis present

## 2022-03-20 DIAGNOSIS — F411 Generalized anxiety disorder: Secondary | ICD-10-CM | POA: Diagnosis present

## 2022-03-20 DIAGNOSIS — E1165 Type 2 diabetes mellitus with hyperglycemia: Secondary | ICD-10-CM | POA: Diagnosis present

## 2022-03-20 DIAGNOSIS — R079 Chest pain, unspecified: Secondary | ICD-10-CM | POA: Diagnosis not present

## 2022-03-20 DIAGNOSIS — E860 Dehydration: Secondary | ICD-10-CM | POA: Diagnosis present

## 2022-03-20 DIAGNOSIS — E1143 Type 2 diabetes mellitus with diabetic autonomic (poly)neuropathy: Secondary | ICD-10-CM | POA: Diagnosis present

## 2022-03-20 DIAGNOSIS — M797 Fibromyalgia: Secondary | ICD-10-CM | POA: Diagnosis present

## 2022-03-20 DIAGNOSIS — I5042 Chronic combined systolic (congestive) and diastolic (congestive) heart failure: Secondary | ICD-10-CM | POA: Diagnosis present

## 2022-03-20 DIAGNOSIS — Z8673 Personal history of transient ischemic attack (TIA), and cerebral infarction without residual deficits: Secondary | ICD-10-CM

## 2022-03-20 DIAGNOSIS — Z79899 Other long term (current) drug therapy: Secondary | ICD-10-CM

## 2022-03-20 DIAGNOSIS — I2699 Other pulmonary embolism without acute cor pulmonale: Principal | ICD-10-CM | POA: Diagnosis present

## 2022-03-20 DIAGNOSIS — N179 Acute kidney failure, unspecified: Secondary | ICD-10-CM | POA: Diagnosis present

## 2022-03-20 DIAGNOSIS — N184 Chronic kidney disease, stage 4 (severe): Secondary | ICD-10-CM | POA: Insufficient documentation

## 2022-03-20 DIAGNOSIS — Z86711 Personal history of pulmonary embolism: Secondary | ICD-10-CM

## 2022-03-20 DIAGNOSIS — Z885 Allergy status to narcotic agent status: Secondary | ICD-10-CM

## 2022-03-20 DIAGNOSIS — Z833 Family history of diabetes mellitus: Secondary | ICD-10-CM

## 2022-03-20 DIAGNOSIS — I252 Old myocardial infarction: Secondary | ICD-10-CM

## 2022-03-20 DIAGNOSIS — E1142 Type 2 diabetes mellitus with diabetic polyneuropathy: Secondary | ICD-10-CM | POA: Diagnosis present

## 2022-03-20 DIAGNOSIS — E785 Hyperlipidemia, unspecified: Secondary | ICD-10-CM | POA: Diagnosis present

## 2022-03-20 DIAGNOSIS — B181 Chronic viral hepatitis B without delta-agent: Secondary | ICD-10-CM | POA: Diagnosis present

## 2022-03-20 DIAGNOSIS — I2489 Other forms of acute ischemic heart disease: Secondary | ICD-10-CM | POA: Diagnosis present

## 2022-03-20 DIAGNOSIS — R112 Nausea with vomiting, unspecified: Principal | ICD-10-CM

## 2022-03-20 DIAGNOSIS — R809 Proteinuria, unspecified: Secondary | ICD-10-CM | POA: Diagnosis present

## 2022-03-20 DIAGNOSIS — I130 Hypertensive heart and chronic kidney disease with heart failure and stage 1 through stage 4 chronic kidney disease, or unspecified chronic kidney disease: Secondary | ICD-10-CM | POA: Diagnosis present

## 2022-03-20 DIAGNOSIS — I2510 Atherosclerotic heart disease of native coronary artery without angina pectoris: Secondary | ICD-10-CM | POA: Diagnosis present

## 2022-03-20 DIAGNOSIS — R7989 Other specified abnormal findings of blood chemistry: Secondary | ICD-10-CM

## 2022-03-20 DIAGNOSIS — I10 Essential (primary) hypertension: Secondary | ICD-10-CM | POA: Diagnosis present

## 2022-03-20 DIAGNOSIS — Z888 Allergy status to other drugs, medicaments and biological substances status: Secondary | ICD-10-CM

## 2022-03-20 DIAGNOSIS — N189 Chronic kidney disease, unspecified: Secondary | ICD-10-CM | POA: Insufficient documentation

## 2022-03-20 DIAGNOSIS — F1410 Cocaine abuse, uncomplicated: Secondary | ICD-10-CM | POA: Diagnosis present

## 2022-03-20 DIAGNOSIS — K3184 Gastroparesis: Secondary | ICD-10-CM | POA: Diagnosis present

## 2022-03-20 DIAGNOSIS — K219 Gastro-esophageal reflux disease without esophagitis: Secondary | ICD-10-CM | POA: Diagnosis present

## 2022-03-20 DIAGNOSIS — I82531 Chronic embolism and thrombosis of right popliteal vein: Secondary | ICD-10-CM | POA: Diagnosis present

## 2022-03-20 DIAGNOSIS — T50916A Underdosing of multiple unspecified drugs, medicaments and biological substances, initial encounter: Secondary | ICD-10-CM | POA: Diagnosis present

## 2022-03-20 DIAGNOSIS — Z955 Presence of coronary angioplasty implant and graft: Secondary | ICD-10-CM

## 2022-03-20 DIAGNOSIS — F329 Major depressive disorder, single episode, unspecified: Secondary | ICD-10-CM | POA: Diagnosis present

## 2022-03-20 DIAGNOSIS — Z794 Long term (current) use of insulin: Secondary | ICD-10-CM

## 2022-03-20 DIAGNOSIS — Z91138 Patient's unintentional underdosing of medication regimen for other reason: Secondary | ICD-10-CM

## 2022-03-20 DIAGNOSIS — E1122 Type 2 diabetes mellitus with diabetic chronic kidney disease: Secondary | ICD-10-CM | POA: Diagnosis present

## 2022-03-20 DIAGNOSIS — I472 Ventricular tachycardia, unspecified: Secondary | ICD-10-CM | POA: Diagnosis present

## 2022-03-20 DIAGNOSIS — I214 Non-ST elevation (NSTEMI) myocardial infarction: Secondary | ICD-10-CM | POA: Diagnosis not present

## 2022-03-20 DIAGNOSIS — N1832 Chronic kidney disease, stage 3b: Secondary | ICD-10-CM | POA: Diagnosis present

## 2022-03-20 DIAGNOSIS — G4733 Obstructive sleep apnea (adult) (pediatric): Secondary | ICD-10-CM | POA: Diagnosis present

## 2022-03-20 DIAGNOSIS — N185 Chronic kidney disease, stage 5: Secondary | ICD-10-CM | POA: Insufficient documentation

## 2022-03-20 DIAGNOSIS — E119 Type 2 diabetes mellitus without complications: Secondary | ICD-10-CM

## 2022-03-20 LAB — COMPREHENSIVE METABOLIC PANEL
ALT: 12 U/L (ref 0–44)
AST: 36 U/L (ref 15–41)
Albumin: 2.5 g/dL — ABNORMAL LOW (ref 3.5–5.0)
Alkaline Phosphatase: 66 U/L (ref 38–126)
Anion gap: 14 (ref 5–15)
BUN: 41 mg/dL — ABNORMAL HIGH (ref 6–20)
CO2: 22 mmol/L (ref 22–32)
Calcium: 9.2 mg/dL (ref 8.9–10.3)
Chloride: 107 mmol/L (ref 98–111)
Creatinine, Ser: 2.67 mg/dL — ABNORMAL HIGH (ref 0.44–1.00)
GFR, Estimated: 21 mL/min — ABNORMAL LOW (ref >60)
Glucose, Bld: 147 mg/dL — ABNORMAL HIGH (ref 70–99)
Potassium: 3.6 mmol/L (ref 3.5–5.1)
Sodium: 143 mmol/L (ref 135–145)
Total Bilirubin: 0.8 mg/dL (ref 0.3–1.2)
Total Protein: 6.7 g/dL (ref 6.5–8.1)

## 2022-03-20 LAB — CBC WITH DIFFERENTIAL/PLATELET
Abs Immature Granulocytes: 0.05 10*3/uL (ref 0.00–0.07)
Basophils Absolute: 0.1 10*3/uL (ref 0.0–0.1)
Basophils Relative: 0 %
Eosinophils Absolute: 0 10*3/uL (ref 0.0–0.5)
Eosinophils Relative: 0 %
HCT: 37.1 % (ref 36.0–46.0)
Hemoglobin: 12.5 g/dL (ref 12.0–15.0)
Immature Granulocytes: 0 %
Lymphocytes Relative: 18 %
Lymphs Abs: 2.8 10*3/uL (ref 0.7–4.0)
MCH: 26.1 pg (ref 26.0–34.0)
MCHC: 33.7 g/dL (ref 30.0–36.0)
MCV: 77.5 fL — ABNORMAL LOW (ref 80.0–100.0)
Monocytes Absolute: 1.2 10*3/uL — ABNORMAL HIGH (ref 0.1–1.0)
Monocytes Relative: 8 %
Neutro Abs: 11.4 10*3/uL — ABNORMAL HIGH (ref 1.7–7.7)
Neutrophils Relative %: 74 %
Platelets: 435 10*3/uL — ABNORMAL HIGH (ref 150–400)
RBC: 4.79 MIL/uL (ref 3.87–5.11)
RDW: 15.4 % (ref 11.5–15.5)
WBC: 15.5 10*3/uL — ABNORMAL HIGH (ref 4.0–10.5)
nRBC: 0 % (ref 0.0–0.2)

## 2022-03-20 LAB — TROPONIN I (HIGH SENSITIVITY)
Troponin I (High Sensitivity): 207 ng/L
Troponin I (High Sensitivity): 190 ng/L (ref <18)

## 2022-03-20 LAB — D-DIMER, QUANTITATIVE: D-Dimer, Quant: 0.93 ug{FEU}/mL — ABNORMAL HIGH (ref 0.00–0.50)

## 2022-03-20 MED ORDER — DIPHENHYDRAMINE HCL 50 MG/ML IJ SOLN
50.0000 mg | Freq: Once | INTRAMUSCULAR | Status: AC
  Administered 2022-03-20: 50 mg via INTRAMUSCULAR
  Filled 2022-03-20: qty 1

## 2022-03-20 MED ORDER — HALOPERIDOL LACTATE 5 MG/ML IJ SOLN
5.0000 mg | Freq: Once | INTRAMUSCULAR | Status: AC
  Administered 2022-03-20: 5 mg via INTRAMUSCULAR
  Filled 2022-03-20: qty 1

## 2022-03-20 MED ORDER — SODIUM CHLORIDE 0.9 % IV BOLUS
1000.0000 mL | Freq: Once | INTRAVENOUS | Status: AC
  Administered 2022-03-20: 1000 mL via INTRAVENOUS

## 2022-03-20 NOTE — ED Provider Triage Note (Signed)
Emergency Medicine Provider Triage Evaluation Note  Shirley [name] , a 46 y.o. female  was evaluated in triage.  Pt complains of nausea vomiting and chest pain.  Denies a history of recurrent episodes of cyclical vomiting.  She has been vomiting for the past 48 hours.  She has chest pain but is not sure if it is specifically related to vomiting.  Patient notably tachycardic and appears dry  Review of Systems  Positive: Vomiting Negative: Fever  Physical Exam  BP (!) 150/115   Pulse (!) 146   Temp 98.2 F (36.8 C) (Oral)   Resp (!) 22   SpO2 98%  Gen:   Awake, no distress   Resp:  Normal effort  MSK:   Moves extremities without difficulty  Other:  Mucosa, retching, tachycardic  Medical Decision Making  Medically screening exam initiated at 6:30 PM.  Appropriate orders placed.  Shirley [name] was informed that the remainder of the evaluation will be completed by another provider, this initial triage assessment does not replace that evaluation, and the importance of remaining in the ED until their evaluation is complete.  EKG shows QTc of 449.  IM Haldol and Benadryl ordered   Shirley Mail, PA-C 03/20/22 1836

## 2022-03-20 NOTE — Consult Note (Signed)
Cardiology Consultation   Patient ID: ABRIAL ARRIGHI MRN: [id number]; DOB: August 12, 1975  Admit date: 03/20/2022 Date of Consult: 03/20/2022  PCP:  Shirley Batten, NP   [facility] Providers Cardiologist:  None        Patient Profile:   Shirley [name] is a 46 y.o. female with a hx of MI s/p PDA balloon angioplasty, HLD, N8GN complicated by chronic, recurrent gastroparesis, HTN, CKD IIIb, CVA, GAD, MDD, chronic Hep B, stimulant use disorder who is being seen 03/20/2022 for the evaluation of elevated troponin at the request of Shirley [name].  History of Present Illness:   Limited by severe somnolence after receiving haldol and benadryl in the ED for refractory nausea and vomiting.  Shirley [name] presents to the ED with two days of recurrent nausea and vomiting similar to bouts of gastroparesis she has had in the past. This was associated with typical chest pressure and sharp pains within her chest after episodes of emesis.   She denies orthopnea, PND, or LE edema.   In the ED her workup was notable for elevated troponin of 207 -> 190. BP as high as 190/115.  In the past, urine drug screens have been positive for cocaine on multiple occasions.   Past Medical History:  Diagnosis Date   Abscess of tunica vaginalis    10/09- Abundant S. aureus- sensitive to all abx   Anxiety    Blood dyscrasia    CAD (coronary artery disease) 06/15/2006   s/p Subendocardial MI with PDA angioplasty(no stent) on 06/15/06 and relook  cath 06/19/06 showed patency of site. Cath 12/10- no restenosis or significant CAD progression   CVA (cerebral vascular accident) ([place]) ~ 02/2014   denies residual on 04/22/2014   CVA (cerebral vascular accident) [facility])    history of remote right cerebellar infarct noted on head CT at least since 10/2011   Depression    Diabetes mellitus type 2, uncontrolled, with complications    Fibromyalgia    Gastritis    Gastroparesis    secondary to poorly  controlled DM, last emptying study performed 01/2010  was normal but may be falsely positive as pt was on reglan   GERD (gastroesophageal reflux disease)    Hepatitis B, chronic (HCC)    Hep BeAb+,Hep B cAb+ & Hep BsAg+ (9/06)   History of pyelonephritis    H/o GrpB Pyelonephritis (9/06) and UTI- 07/11- E.Coli, 12/10- GBS   Hyperlipidemia    Hypertension    Iron deficiency anemia    Irregular menses    Small ovarian follicles seen on FA(2/13)   MI (myocardial infarction) ([place]) 05/2006   PDA percutaneous transluminal coronary angioplasty   Migraine    "weekly" (04/22/2014)   N&V (nausea and vomiting)    Chronic. Unclear etiology with multiple admission and ED visits. CT abdomen with and without contrast (02/2011)  showed no acute process. Gastic Emptying scan (01/2010) was normal. Ultrasound of the abdomen was within normal limits. Hepatitis B viral load was undectable. HIV NR. EGD - gastritis, Hpylori + s/p Rx   Obesity    OSA (obstructive sleep apnea)    "suppose to wear mask but I don't" (04/22/2014)   Peripheral neuropathy    Pneumonia    "this is probably the 2nd or 3rd time I've had pneumonia" (04/22/2014)   Recurrent boils    Seasonal asthma    Substance abuse (HCC)    Thrombocytosis    Hem/Onc suggested 2/2 chronic hepatits and/or iron deficiency anemia  Past Surgical History:  Procedure Laterality Date   CESAREAN SECTION  1997   CORONARY ANGIOPLASTY WITH STENT PLACEMENT  2008   "2 stents"   ESOPHAGOGASTRODUODENOSCOPY N/A 04/23/2014   Procedure: ESOPHAGOGASTRODUODENOSCOPY (EGD);  Surgeon: Winfield Cunas., MD;  Location: [facility] ENDOSCOPY;  Service: Endoscopy;  Laterality: N/A;   INCISION AND DRAINAGE ABSCESS Right 04/17/2021   Procedure: INCISION AND DRAINAGE CHEST WALL ABSCESS;  Surgeon: Jesusita Oka, MD;  Location: [facility];  Service: General;  Laterality: Right;   IR ANGIOGRAM PELVIS SELECTIVE OR SUPRASELECTIVE  12/07/2016   IR ANGIOGRAM PELVIS SELECTIVE OR  SUPRASELECTIVE  12/07/2016   IR ANGIOGRAM SELECTIVE EACH ADDITIONAL VESSEL  12/07/2016   IR ANGIOGRAM SELECTIVE EACH ADDITIONAL VESSEL  12/07/2016   IR EMBO ARTERIAL NOT HEMORR HEMANG INC GUIDE ROADMAPPING  12/07/2016   IR RADIOLOGIST EVAL & MGMT  01/09/2017   IR US GUIDE VASC ACCESS RIGHT  12/07/2016   IRRIGATION AND DEBRIDEMENT FOOT Right 04/07/2020   Procedure: Incision and Drainage Right Foot;  Surgeon: Evelina Bucy, DPM;  Location: [facility];  Service: Podiatry;  Laterality: Right;       Inpatient Medications: Scheduled Meds:  Continuous Infusions:  PRN Meds:   Allergies:    Allergies  Allergen Reactions   Lisinopril Nausea Only and Swelling   Morphine And Related Itching and Swelling    Social History:   Social History   Socioeconomic History   Marital status: Single    Spouse name: Not on file   Number of children: 2   Years of education: 11   Highest education level: Not on file  Occupational History   Occupation: other    Comment: unemployed  Tobacco Use   Smoking status: Former    Types: Cigarettes    Quit date: 04/24/1996    Years since quitting: 25.9   Smokeless tobacco: Never   Tobacco comments:    quit smoking cigarettes age 42  Vaping Use   Vaping Use: Never used  Substance and Sexual Activity   Alcohol use: No    Alcohol/week: 0.0 standard drinks of alcohol    Comment: 04/22/2014 "might have a few drinks a month"   Drug use: Not Currently    Types: Marijuana, Cocaine    Comment: 04/22/2104 "quit drugs ~ 1-2 yr ago"   Sexual activity: Yes    Birth control/protection: None  Other Topics Concern   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   Also works at [employer] having to lift elderly individuals.         Social Determinants of Health   Financial Resource Strain: Not on file  Food Insecurity: Not on file  Transportation Needs: Not on file  Physical  Activity: Not on file  Stress: Not on file  Social Connections: Not on file  Intimate Partner Violence: Not on file    Family History:    Family History  Problem Relation Age of Onset   Diabetes Father    Healthy Mother      ROS:  Please see the history of present illness.   All other ROS reviewed and negative.     Physical Exam/Data:   Vitals:   03/20/22 2200 03/20/22 2205 03/20/22 2230 03/20/22 2300  BP: (!) 186/123  (!) 190/117 (!) 192/114  Pulse:   (!) 128 (!) 129  Resp: 14  13 (!) 27  Temp:  98.8 F (37.1 C)    TempSrc:  Oral    SpO2:   99% 97%    Intake/Output Summary (Last 24 hours) at 03/20/2022 2358 Last data filed at 03/20/2022 2331 Gross per 24 hour  Intake 2000.06 ml  Output --  Net 2000.06 ml      06/01/2021    4:22 PM 04/26/2021    5:00 AM 04/25/2021    5:15 AM  Last 3 Weights  Weight (lbs) 249 lb 12.5 oz 249 lb 12.5 oz 252 lb 10.4 oz  Weight (kg) 113.3 kg 113.3 kg 114.6 kg     There is no height or weight on file to calculate BMI.  General:  Somnolent but arousable HEENT: normal Neck: no JVD Vascular: No carotid bruits; Distal pulses 2+ bilaterally Cardiac:  normal S1, S2; RRR; no murmur Lungs:  clear to auscultation bilaterally, no wheezing, rhonchi or rales  Abd: soft, nontender, no hepatomegaly  Ext: no edema Musculoskeletal:  No deformities, BUE and BLE strength normal and equal Skin: warm and dry  Neuro:  CNs 2-12 intact, no focal abnormalities noted Psych:  Normal affect   EKG:  The EKG was personally reviewed and demonstrates:  Sinus tachycardia Telemetry:  Telemetry was personally reviewed and demonstrates:  n/a  Relevant CV Studies: Echo 2009 SUMMARY   -  Overall left ventricular systolic function was normal. Left         ventricular ejection fraction was estimated to be 55 %. There         were no left ventricular regional wall motion abnormalities.         Left ventricular wall thickness was mildly increased.   -  The left  atrium was mildly dilated.   Laboratory Data:  High Sensitivity Troponin:   Recent Labs  Lab 03/20/22 1847 03/20/22 2110  TROPONINIHS 207* 190*     Chemistry Recent Labs  Lab 03/20/22 1847  NA 143  K 3.6  CL 107  CO2 22  GLUCOSE 147*  BUN 41*  CREATININE 2.67*  CALCIUM 9.2  GFRNONAA 21*  ANIONGAP 14    Recent Labs  Lab 03/20/22 1847  PROT 6.7  ALBUMIN 2.5*  AST 36  ALT 12  ALKPHOS 66  BILITOT 0.8   Lipids No results for input(s): "CHOL", "TRIG", "HDL", "LABVLDL", "LDLCALC", "CHOLHDL" in the last 168 hours.  Hematology Recent Labs  Lab 03/20/22 1847  WBC 15.5*  RBC 4.79  HGB 12.5  HCT 37.1  MCV 77.5*  MCH 26.1  MCHC 33.7  RDW 15.4  PLT 435*   Thyroid No results for input(s): "TSH", "FREET4" in the last 168 hours.  BNPNo results for input(s): "BNP", "PROBNP" in the last 168 hours.  DDimer  Recent Labs  Lab 03/20/22 2227  DDIMER 0.93*     Radiology/Studies:  DG Chest Portable 1 View  Result Date: 03/20/2022 CLINICAL DATA:  Chest pain EXAM: PORTABLE CHEST 1 VIEW COMPARISON:  04/20/2021 FINDINGS: The heart size is mildly enlarged. Both lungs are clear. The visualized skeletal structures are unremarkable. IMPRESSION: Mild cardiomegaly. No acute cardiopulmonary findings. Electronically Signed   By: Davina Poke D.O.   On: 03/20/2022 21:31     Assessment and Plan:   NSTEMI Type II Hx of NSTEMI s/p balloon angioplasty of PDA HLD HTN Stimulant use disorder  Shirley [name] is a 46 yr old female with history of MI, hyperlipidemia, hypertension, poorly controlled type 2 diabetes who presents with 2 days of severe gastroparesis and severely elevated blood  pressure.  In this context her work-up was notable for mildly elevated troponin to 207 with repeat flat at 190.  Her ECG showed sinus tachycardia without clear ischemic changes.  Given the overall clinical picture, suspect this is demand ischemia in the setting of recurrent severe gastroparesis and  severe hypertension. I am concerned that her hypertension may be a consequence of ongoing cocaine use.  Recommendations: - 5 mg IV hydralazine for severe hypertension, goal BP 790-240 systolic - Complete Echocardiogram - Increase Atorvastatin to 40 mg daily - Start ASA daily given history of NSTEMI and balloon angioplasty - Continue amlodipine - Switch metoprolol to carvedilol 6.25 mg bid to avoid unopposed alpha in the setting of cocaine use and for improved blood pressure control  Risk Assessment/Risk Scores:                For questions or updates, please contact [place] Please consult [email or web address] for contact info under    Signed, Loren Racer, MD  03/20/2022 11:58 PM

## 2022-03-20 NOTE — ED Notes (Signed)
Trop 207 reported to [facility]

## 2022-03-20 NOTE — ED Provider Notes (Addendum)
[facility] EMERGENCY DEPARTMENT Provider Note   CSN: [id number] Arrival date & time: 03/20/22  1815     History  Chief Complaint  Shirley presents with   Chest Pain    Shirley [name] is a 46 y.o. female.   Chest Pain Shirley [name] with chest pain.  Anterior chest.  Began 2 days ago with some sweatiness.  States when that happens she ends up having nausea and vomiting.  History of gastroparesis.  States she has been vomiting for years.  Is on Reglan at home.  States she has been unable to keep her medicines down.  States she had an MI back in 2008.  Does not rumor that felt like.  Blood pressure is somewhat elevated here.  States she has been unable to eat and drink.    Past Medical History:  Diagnosis Date   Abscess of tunica vaginalis    10/09- Abundant S. aureus- sensitive to all abx   Anxiety    Blood dyscrasia    CAD (coronary artery disease) 06/15/2006   s/p Subendocardial MI with PDA angioplasty(no stent) on 06/15/06 and relook  cath 06/19/06 showed patency of site. Cath 12/10- no restenosis or significant CAD progression   CVA (cerebral vascular accident) ([place]) ~ 02/2014   denies residual on 04/22/2014   CVA (cerebral vascular accident) [facility])    history of remote right cerebellar infarct noted on head CT at least since 10/2011   Depression    Diabetes mellitus type 2, uncontrolled, with complications    Fibromyalgia    Gastritis    Gastroparesis    secondary to poorly controlled DM, last emptying study performed 01/2010  was normal but may be falsely positive as pt was on reglan   GERD (gastroesophageal reflux disease)    Hepatitis B, chronic (HCC)    Hep BeAb+,Hep B cAb+ & Hep BsAg+ (9/06)   History of pyelonephritis    H/o GrpB Pyelonephritis (9/06) and UTI- 07/11- E.Coli, 12/10- GBS   Hyperlipidemia    Hypertension    Iron deficiency anemia    Irregular menses    Small ovarian follicles seen on LD(3/57)   MI (myocardial infarction) ([place])  05/2006   PDA percutaneous transluminal coronary angioplasty   Migraine    "weekly" (04/22/2014)   N&V (nausea and vomiting)    Chronic. Unclear etiology with multiple admission and ED visits. CT abdomen with and without contrast (02/2011)  showed no acute process. Gastic Emptying scan (01/2010) was normal. Ultrasound of the abdomen was within normal limits. Hepatitis B viral load was undectable. HIV NR. EGD - gastritis, Hpylori + s/p Rx   Obesity    OSA (obstructive sleep apnea)    "suppose to wear mask but I don't" (04/22/2014)   Peripheral neuropathy    Pneumonia    "this is probably the 2nd or 3rd time I've had pneumonia" (04/22/2014)   Recurrent boils    Seasonal asthma    Substance abuse ([place])    Thrombocytosis    Hem/Onc suggested 2/2 chronic hepatits and/or iron deficiency anemia    Home Medications Prior to Admission medications   Medication Sig Start Date End Date Taking? Authorizing Provider  acetaminophen (TYLENOL) 500 MG tablet Take 1,000 mg by mouth every 6 (six) hours as needed for moderate pain.    [provider]  albuterol (VENTOLIN HFA) 108 (90 Base) MCG/ACT inhaler Inhale 2 puffs into the lungs every 4 (four) hours as needed for wheezing or shortness of breath. 11/30/19  Domenic Polite, MD  amLODipine (NORVASC) 10 MG tablet Take 1 tablet (10 mg total) by mouth daily. 04/26/21   Aline August, MD  atorvastatin (LIPITOR) 20 MG tablet Take 20 mg by mouth at bedtime. 04/11/21   [provider]  blood glucose meter kit and supplies KIT Dispense based on Shirley and insurance preference. Use up to four times daily as directed. (FOR ICD-9 250.00, 250.01). 01/04/20   Khatri, Hina, PA-C  Blood Glucose Monitoring Suppl (ONETOUCH VERIO REFLECT) w/Device KIT 1 each by Does not apply route 3 (three) times daily. 14-Dec-2018   Kerin Perna, NP  cyclobenzaprine (FLEXERIL) 10 MG tablet Take 10 mg by mouth 3 (three) times daily as needed for muscle spasms.     [provider]  dicyclomine (BENTYL) 10 MG capsule Take 1 capsule (10 mg total) by mouth 4 (four) times daily -  before meals and at bedtime. 08/20/21   Fransico Meadow, PA-C  diphenhydrAMINE (BENADRYL) 25 MG tablet Take 25 mg by mouth every 6 (six) hours as needed for allergies.    [provider]  famotidine (PEPCID) 20 MG tablet Take 20 mg by mouth every evening. 04/11/21   [provider]  furosemide (LASIX) 40 MG tablet Take 1 tablet (40 mg total) by mouth daily for 7 days. 04/26/21 05/03/21  Aline August, MD  gabapentin (NEURONTIN) 400 MG capsule Take 1 capsule (400 mg total) by mouth daily at 6 (six) AM. 04/26/21   Aline August, MD  glucose blood (ONETOUCH VERIO) test strip Use as instructed to check blood sugar 3 times daily. Dx codes: E11.8, E11.65 2018-12-14   Charlott Rakes, MD  HUMALOG KWIKPEN 100 UNIT/ML KwikPen Inject 15 Units into the skin 4 (four) times daily -  before meals and at bedtime. Hold if blood glucose is less than 110 11/11/19   [provider]  Insulin Pen Needle (PEN NEEDLES 3/16") 31G X 5 MM MISC 1 Device by Does not apply route 4 (four) times daily - after meals and at bedtime. 11/28/18   Kerin Perna, NP  INSULIN SYRINGE .5CC/28G (INS SYRINGE/NEEDLE .5CC/28G) 28G X 1/2" 0.5 ML MISC Please provide 1 month supply 09/04/17   Colbert Ewing, MD  meclizine (ANTIVERT) 25 MG tablet Take 1 tablet (25 mg total) by mouth 3 (three) times daily as needed for dizziness. Shirley not taking: Reported on 08/20/2021 04/26/21   Aline August, MD  methocarbamol (ROBAXIN) 500 MG tablet Take 1 tablet (500 mg total) by mouth every 6 (six) hours as needed for muscle spasms. Shirley not taking: Reported on 08/20/2021 04/26/21   Aline August, MD  metoCLOPramide (REGLAN) 10 MG tablet Take 1 tablet (10 mg total) by mouth 3 (three) times daily with meals. 08/20/21 08/20/22  Fransico Meadow, PA-C  metoprolol succinate (TOPROL-XL) 50 MG 24 hr tablet Take 50 mg by  mouth daily. 05/16/21   [provider]  MOUNJARO 5 MG/0.5ML Pen Inject 0.5 mLs into the skin once a week. 06/15/21   [provider]  omeprazole (PRILOSEC) 40 MG capsule Take 40 mg by mouth daily. 03/10/21   [provider]  OneTouch Delica Lancets 62B MISC Use to check blood sugar 3 times daily. Dx codes: E11.8, E11.65 December 14, 2018   Charlott Rakes, MD  polyethylene glycol powder (GLYCOLAX/MIRALAX) 17 GM/SCOOP powder Take 17 g by mouth daily as needed. Shirley not taking: Reported on 08/20/2021 04/26/21   Aline August, MD  senna (SENOKOT) 8.6 MG TABS tablet Take 1 tablet (8.6 mg  total) by mouth 2 (two) times daily. Shirley not taking: Reported on 08/20/2021 04/26/21   Aline August, MD      Allergies    Lisinopril and Morphine and related    Review of Systems   Review of Systems  Cardiovascular:  Positive for chest pain.    Physical Exam Updated Vital Signs BP (!) 186/123   Pulse (!) 133   Temp 98.8 F (37.1 C) (Oral)   Resp 14   SpO2 100%  Physical Exam Vitals and nursing note reviewed.  HENT:     Head: Atraumatic.  Cardiovascular:     Rate and Rhythm: Regular rhythm.  Pulmonary:     Effort: Pulmonary effort is normal.     Breath sounds: No wheezing.  Chest:     Chest wall: No tenderness.  Abdominal:     Tenderness: There is no abdominal tenderness.  Musculoskeletal:     Right lower leg: No edema.     Left lower leg: No edema.  Skin:    General: Skin is warm.     Capillary Refill: Capillary refill takes less than 2 seconds.  Neurological:     Mental Status: She is alert.     ED Results / Procedures / Treatments   Labs (all labs ordered are listed, but only abnormal results are displayed) Labs Reviewed  COMPREHENSIVE METABOLIC PANEL - Abnormal; Notable for the following components:      Result Value   Glucose, Bld 147 (*)    BUN 41 (*)    Creatinine, Ser 2.67 (*)    Albumin 2.5 (*)    GFR, Estimated 21 (*)    All other components within  normal limits  CBC WITH DIFFERENTIAL/PLATELET - Abnormal; Notable for the following components:   WBC 15.5 (*)    MCV 77.5 (*)    Platelets 435 (*)    Neutro Abs 11.4 (*)    Monocytes Absolute 1.2 (*)    All other components within normal limits  TROPONIN I (HIGH SENSITIVITY) - Abnormal; Notable for the following components:   Troponin I (High Sensitivity) 207 (*)    All other components within normal limits  TROPONIN I (HIGH SENSITIVITY) - Abnormal; Notable for the following components:   Troponin I (High Sensitivity) 190 (*)    All other components within normal limits  RAPID URINE DRUG SCREEN, HOSP PERFORMED    EKG EKG Interpretation  Date/Time:  Monday March 20 2022 18:30:23 EST Ventricular Rate:  148 PR Interval:  130 QRS Duration: 66 QT Interval:  286 QTC Calculation: 449 R Axis:   45 Text Interpretation: Sinus tachycardia with occasional Premature ventricular complexes Right atrial enlargement Nonspecific T wave abnormality Abnormal ECG When compared with ECG of 20-Aug-2021 06:56,  rate mildly incresaed Confirmed by Davonna Belling [phone]) on 03/20/2022 8:52:24 PM  Radiology DG Chest Portable 1 View  Result Date: 03/20/2022 CLINICAL DATA:  Chest pain EXAM: PORTABLE CHEST 1 VIEW COMPARISON:  04/20/2021 FINDINGS: The heart size is mildly enlarged. Both lungs are clear. The visualized skeletal structures are unremarkable. IMPRESSION: Mild cardiomegaly. No acute cardiopulmonary findings. Electronically Signed   By: Davina Poke D.O.   On: 03/20/2022 21:31    Procedures Procedures    Medications Ordered in ED Medications  sodium chloride 0.9 % bolus 1,000 mL (0 mLs Intravenous Stopped 03/20/22 2205)  haloperidol lactate (HALDOL) injection 5 mg (5 mg Intramuscular Given 03/20/22 1837)  diphenhydrAMINE (BENADRYL) injection 50 mg (50 mg Intramuscular Given 03/20/22 1837)  sodium chloride 0.9 %  bolus 1,000 mL (1,000 mLs Intravenous New Bag/Given 03/20/22 2204)     ED Course/ Medical Decision Making/ A&P                           Medical Decision Making Amount and/or Complexity of Data Reviewed Labs: ordered. Radiology: ordered.  Risk Decision regarding hospitalization.   Shirley with nausea and vomiting.  History of same.  History of gastroparesis.  Rather benign abdominal exam.  Creatinine is elevated.  Reviewed previous notes.  Has been elevated like this in the past but appears baseline is lower than this.  However troponin also elevated.  Initially 207 but now 190.  Potentially due to hypertension.  Also could be secondary to dehydration.  Has not been compliant with her medicines due to the vomiting.  Fluid boluses are given.  EKG reassuring besides a tachycardia.  Will discuss with cardiology but will require admission to the hospital.   Discussed with hospitalist.  Request that I add D-dimer.  Also request call cardiology.  Discussed with cardiology who will consult.       Final Clinical Impression(s) / ED Diagnoses Final diagnoses:  Nausea and vomiting, unspecified vomiting type  Elevated troponin  Dehydration    Rx / DC Orders ED Discharge Orders     None         Davonna Belling, MD 03/20/22 2219    Davonna Belling, MD 03/20/22 [phone]

## 2022-03-20 NOTE — ED Triage Notes (Signed)
BIB EMS from home  CP and hematemesis per pt x 3 days.  Pt tearful at time of triage.  191/113, HR 150, 97% RA, CBG 158.  Hx of gastroparesis.  Given 324 ASA and 1 nitro by EMS.

## 2022-03-21 ENCOUNTER — Observation Stay (HOSPITAL_COMMUNITY): Payer: Medicare Other

## 2022-03-21 DIAGNOSIS — N179 Acute kidney failure, unspecified: Secondary | ICD-10-CM | POA: Diagnosis present

## 2022-03-21 DIAGNOSIS — I2699 Other pulmonary embolism without acute cor pulmonale: Secondary | ICD-10-CM | POA: Diagnosis present

## 2022-03-21 DIAGNOSIS — I472 Ventricular tachycardia, unspecified: Secondary | ICD-10-CM | POA: Diagnosis present

## 2022-03-21 DIAGNOSIS — Z91138 Patient's unintentional underdosing of medication regimen for other reason: Secondary | ICD-10-CM | POA: Diagnosis not present

## 2022-03-21 DIAGNOSIS — F329 Major depressive disorder, single episode, unspecified: Secondary | ICD-10-CM | POA: Diagnosis present

## 2022-03-21 DIAGNOSIS — Z79899 Other long term (current) drug therapy: Secondary | ICD-10-CM | POA: Diagnosis not present

## 2022-03-21 DIAGNOSIS — I252 Old myocardial infarction: Secondary | ICD-10-CM | POA: Diagnosis not present

## 2022-03-21 DIAGNOSIS — M797 Fibromyalgia: Secondary | ICD-10-CM | POA: Diagnosis present

## 2022-03-21 DIAGNOSIS — I2693 Single subsegmental pulmonary embolism without acute cor pulmonale: Secondary | ICD-10-CM

## 2022-03-21 DIAGNOSIS — R079 Chest pain, unspecified: Secondary | ICD-10-CM | POA: Diagnosis present

## 2022-03-21 DIAGNOSIS — I82531 Chronic embolism and thrombosis of right popliteal vein: Secondary | ICD-10-CM | POA: Diagnosis present

## 2022-03-21 DIAGNOSIS — B181 Chronic viral hepatitis B without delta-agent: Secondary | ICD-10-CM | POA: Diagnosis present

## 2022-03-21 DIAGNOSIS — E860 Dehydration: Secondary | ICD-10-CM | POA: Diagnosis present

## 2022-03-21 DIAGNOSIS — E1143 Type 2 diabetes mellitus with diabetic autonomic (poly)neuropathy: Secondary | ICD-10-CM | POA: Diagnosis present

## 2022-03-21 DIAGNOSIS — I130 Hypertensive heart and chronic kidney disease with heart failure and stage 1 through stage 4 chronic kidney disease, or unspecified chronic kidney disease: Secondary | ICD-10-CM | POA: Diagnosis present

## 2022-03-21 DIAGNOSIS — N189 Chronic kidney disease, unspecified: Secondary | ICD-10-CM | POA: Insufficient documentation

## 2022-03-21 DIAGNOSIS — R112 Nausea with vomiting, unspecified: Secondary | ICD-10-CM

## 2022-03-21 DIAGNOSIS — Z794 Long term (current) use of insulin: Secondary | ICD-10-CM | POA: Diagnosis not present

## 2022-03-21 DIAGNOSIS — F1410 Cocaine abuse, uncomplicated: Secondary | ICD-10-CM | POA: Diagnosis present

## 2022-03-21 DIAGNOSIS — T50916A Underdosing of multiple unspecified drugs, medicaments and biological substances, initial encounter: Secondary | ICD-10-CM | POA: Diagnosis present

## 2022-03-21 DIAGNOSIS — I2489 Other forms of acute ischemic heart disease: Secondary | ICD-10-CM | POA: Diagnosis present

## 2022-03-21 DIAGNOSIS — E1142 Type 2 diabetes mellitus with diabetic polyneuropathy: Secondary | ICD-10-CM | POA: Diagnosis present

## 2022-03-21 DIAGNOSIS — N1832 Chronic kidney disease, stage 3b: Secondary | ICD-10-CM | POA: Diagnosis present

## 2022-03-21 DIAGNOSIS — N184 Chronic kidney disease, stage 4 (severe): Secondary | ICD-10-CM | POA: Insufficient documentation

## 2022-03-21 DIAGNOSIS — E1122 Type 2 diabetes mellitus with diabetic chronic kidney disease: Secondary | ICD-10-CM | POA: Diagnosis present

## 2022-03-21 DIAGNOSIS — I5042 Chronic combined systolic (congestive) and diastolic (congestive) heart failure: Secondary | ICD-10-CM | POA: Diagnosis present

## 2022-03-21 DIAGNOSIS — I82431 Acute embolism and thrombosis of right popliteal vein: Secondary | ICD-10-CM | POA: Diagnosis not present

## 2022-03-21 DIAGNOSIS — E785 Hyperlipidemia, unspecified: Secondary | ICD-10-CM | POA: Diagnosis present

## 2022-03-21 DIAGNOSIS — F1510 Other stimulant abuse, uncomplicated: Secondary | ICD-10-CM | POA: Diagnosis present

## 2022-03-21 DIAGNOSIS — I2510 Atherosclerotic heart disease of native coronary artery without angina pectoris: Secondary | ICD-10-CM

## 2022-03-21 DIAGNOSIS — N185 Chronic kidney disease, stage 5: Secondary | ICD-10-CM | POA: Insufficient documentation

## 2022-03-21 DIAGNOSIS — E1165 Type 2 diabetes mellitus with hyperglycemia: Secondary | ICD-10-CM | POA: Diagnosis present

## 2022-03-21 LAB — PREGNANCY, URINE: Preg Test, Ur: NEGATIVE

## 2022-03-21 LAB — BASIC METABOLIC PANEL
Anion gap: 9 (ref 5–15)
BUN: 40 mg/dL — ABNORMAL HIGH (ref 6–20)
CO2: 21 mmol/L — ABNORMAL LOW (ref 22–32)
Calcium: 8.1 mg/dL — ABNORMAL LOW (ref 8.9–10.3)
Chloride: 111 mmol/L (ref 98–111)
Creatinine, Ser: 2.46 mg/dL — ABNORMAL HIGH (ref 0.44–1.00)
GFR, Estimated: 24 mL/min — ABNORMAL LOW (ref >60)
Glucose, Bld: 126 mg/dL — ABNORMAL HIGH (ref 70–99)
Potassium: 3.2 mmol/L — ABNORMAL LOW (ref 3.5–5.1)
Sodium: 141 mmol/L (ref 135–145)

## 2022-03-21 LAB — HEMOGLOBIN A1C
Hgb A1c MFr Bld: 5.8 % — ABNORMAL HIGH (ref 4.8–5.6)
Mean Plasma Glucose: 120 mg/dL

## 2022-03-21 LAB — RAPID URINE DRUG SCREEN, HOSP PERFORMED
Amphetamines: NOT DETECTED
Barbiturates: NOT DETECTED
Benzodiazepines: NOT DETECTED
Cocaine: NOT DETECTED
Opiates: NOT DETECTED
Tetrahydrocannabinol: NOT DETECTED

## 2022-03-21 LAB — CBG MONITORING, ED
Glucose-Capillary: 123 mg/dL — ABNORMAL HIGH (ref 70–99)
Glucose-Capillary: 95 mg/dL (ref 70–99)
Glucose-Capillary: 106 mg/dL — ABNORMAL HIGH (ref 70–99)
Glucose-Capillary: 126 mg/dL — ABNORMAL HIGH (ref 70–99)
Glucose-Capillary: 109 mg/dL — ABNORMAL HIGH (ref 70–99)

## 2022-03-21 LAB — ECHOCARDIOGRAM COMPLETE
Calc EF: 41.9 %
Height: 67 in
S' Lateral: 4.2 cm
Single Plane A2C EF: 40.5 %
Single Plane A4C EF: 45.3 %
Weight: 4000 oz

## 2022-03-21 LAB — BRAIN NATRIURETIC PEPTIDE: B Natriuretic Peptide: 462.5 pg/mL — ABNORMAL HIGH (ref 0.0–100.0)

## 2022-03-21 LAB — HEPARIN LEVEL (UNFRACTIONATED): Heparin Unfractionated: 0.85 IU/mL — ABNORMAL HIGH (ref 0.30–0.70)

## 2022-03-21 LAB — MAGNESIUM: Magnesium: 1.8 mg/dL (ref 1.7–2.4)

## 2022-03-21 MED ORDER — TECHNETIUM TO 99M ALBUMIN AGGREGATED
4.0000 | Freq: Once | INTRAVENOUS | Status: AC | PRN
  Administered 2022-03-21: 4 via INTRAVENOUS

## 2022-03-21 MED ORDER — TOBRAMYCIN 0.3 % OP SOLN
1.0000 [drp] | Freq: Four times a day (QID) | OPHTHALMIC | Status: DC
  Administered 2022-03-22 – 2022-03-24 (×11): 1 [drp] via OPHTHALMIC
  Filled 2022-03-21: qty 5

## 2022-03-21 MED ORDER — MAGNESIUM SULFATE 2 GM/50ML IV SOLN
2.0000 g | Freq: Once | INTRAVENOUS | Status: AC
  Administered 2022-03-21: 2 g via INTRAVENOUS
  Filled 2022-03-21: qty 50

## 2022-03-21 MED ORDER — NALOXONE HCL 0.4 MG/ML IJ SOLN: 0.4000 mg | INTRAMUSCULAR | Status: DC | PRN

## 2022-03-21 MED ORDER — ATORVASTATIN CALCIUM 40 MG PO TABS
40.0000 mg | ORAL_TABLET | Freq: Every day | ORAL | Status: DC
  Administered 2022-03-21 – 2022-03-24 (×4): 40 mg via ORAL
  Filled 2022-03-21 (×4): qty 1

## 2022-03-21 MED ORDER — KETOROLAC TROMETHAMINE 0.5 % OP SOLN
1.0000 [drp] | Freq: Four times a day (QID) | OPHTHALMIC | Status: DC
  Administered 2022-03-21 – 2022-03-24 (×11): 1 [drp] via OPHTHALMIC
  Filled 2022-03-21: qty 5

## 2022-03-21 MED ORDER — ACETAMINOPHEN 325 MG PO TABS: 650.0000 mg | ORAL_TABLET | Freq: Four times a day (QID) | ORAL | Status: DC | PRN

## 2022-03-21 MED ORDER — SODIUM CHLORIDE 0.9 % IV SOLN: INTRAVENOUS | Status: AC

## 2022-03-21 MED ORDER — ONDANSETRON HCL 4 MG/2ML IJ SOLN
4.0000 mg | Freq: Four times a day (QID) | INTRAMUSCULAR | Status: DC | PRN
  Administered 2022-03-23: 4 mg via INTRAVENOUS
  Filled 2022-03-21: qty 2

## 2022-03-21 MED ORDER — METOCLOPRAMIDE HCL 5 MG/ML IJ SOLN
10.0000 mg | Freq: Three times a day (TID) | INTRAMUSCULAR | Status: DC
  Administered 2022-03-21 – 2022-03-24 (×11): 10 mg via INTRAVENOUS
  Filled 2022-03-21 (×11): qty 2

## 2022-03-21 MED ORDER — POTASSIUM CHLORIDE 20 MEQ PO PACK
40.0000 meq | PACK | Freq: Once | ORAL | Status: AC
  Administered 2022-03-21: 40 meq via ORAL

## 2022-03-21 MED ORDER — LABETALOL HCL 5 MG/ML IV SOLN
10.0000 mg | INTRAVENOUS | Status: DC | PRN
  Administered 2022-03-21 – 2022-03-23 (×7): 10 mg via INTRAVENOUS
  Filled 2022-03-21 (×6): qty 4

## 2022-03-21 MED ORDER — CARVEDILOL 6.25 MG PO TABS
6.2500 mg | ORAL_TABLET | Freq: Two times a day (BID) | ORAL | Status: DC
  Administered 2022-03-21 – 2022-03-23 (×6): 6.25 mg via ORAL
  Filled 2022-03-21: qty 2
  Filled 2022-03-21 (×2): qty 1
  Filled 2022-03-21 (×2): qty 2
  Filled 2022-03-21: qty 1

## 2022-03-21 MED ORDER — HEPARIN (PORCINE) 25000 UT/250ML-% IV SOLN
1200.0000 [IU]/h | INTRAVENOUS | Status: DC
  Administered 2022-03-21: 1350 [IU]/h via INTRAVENOUS
  Administered 2022-03-21: 1500 [IU]/h via INTRAVENOUS
  Administered 2022-03-22 – 2022-03-24 (×3): 1200 [IU]/h via INTRAVENOUS
  Filled 2022-03-21 (×5): qty 250

## 2022-03-21 MED ORDER — HYDRALAZINE HCL 20 MG/ML IJ SOLN
10.0000 mg | INTRAMUSCULAR | Status: DC | PRN
  Administered 2022-03-21 (×2): 10 mg via INTRAVENOUS
  Filled 2022-03-21 (×2): qty 1

## 2022-03-21 MED ORDER — POTASSIUM CHLORIDE 10 MEQ/100ML IV SOLN
10.0000 meq | INTRAVENOUS | Status: AC
  Administered 2022-03-21 (×4): 10 meq via INTRAVENOUS
  Filled 2022-03-21 (×4): qty 100

## 2022-03-21 MED ORDER — ASPIRIN 81 MG PO CHEW
81.0000 mg | CHEWABLE_TABLET | Freq: Every day | ORAL | Status: DC
  Administered 2022-03-21 – 2022-03-24 (×4): 81 mg via ORAL
  Filled 2022-03-21 (×4): qty 1

## 2022-03-21 MED ORDER — ACETAMINOPHEN 650 MG RE SUPP: 650.0000 mg | Freq: Four times a day (QID) | RECTAL | Status: DC | PRN

## 2022-03-21 MED ORDER — INSULIN ASPART 100 UNIT/ML IJ SOLN
0.0000 [IU] | INTRAMUSCULAR | Status: DC
  Administered 2022-03-21: 1 [IU] via SUBCUTANEOUS
  Administered 2022-03-22: 2 [IU] via SUBCUTANEOUS
  Administered 2022-03-23 (×3): 1 [IU] via SUBCUTANEOUS

## 2022-03-21 MED ORDER — SODIUM CHLORIDE 0.9 % IV BOLUS
1000.0000 mL | Freq: Once | INTRAVENOUS | Status: AC
  Administered 2022-03-21: 1000 mL via INTRAVENOUS

## 2022-03-21 MED ORDER — HYDRALAZINE HCL 20 MG/ML IJ SOLN
5.0000 mg | INTRAMUSCULAR | Status: DC | PRN
  Administered 2022-03-21: 5 mg via INTRAVENOUS
  Filled 2022-03-21: qty 1

## 2022-03-21 MED ORDER — PANTOPRAZOLE SODIUM 40 MG IV SOLR
40.0000 mg | INTRAVENOUS | Status: DC
  Administered 2022-03-21 – 2022-03-24 (×4): 40 mg via INTRAVENOUS
  Filled 2022-03-21 (×4): qty 10

## 2022-03-21 MED ORDER — FENTANYL CITRATE PF 50 MCG/ML IJ SOSY
50.0000 ug | PREFILLED_SYRINGE | INTRAMUSCULAR | Status: DC | PRN
  Administered 2022-03-21 – 2022-03-23 (×2): 50 ug via INTRAVENOUS
  Filled 2022-03-21 (×2): qty 1

## 2022-03-21 MED ORDER — HEPARIN BOLUS VIA INFUSION
3000.0000 [IU] | Freq: Once | INTRAVENOUS | Status: AC
  Administered 2022-03-21: 3000 [IU] via INTRAVENOUS
  Filled 2022-03-21: qty 3000

## 2022-03-21 MED ORDER — POTASSIUM CHLORIDE CRYS ER 20 MEQ PO TBCR
40.0000 meq | EXTENDED_RELEASE_TABLET | Freq: Once | ORAL | Status: AC
  Administered 2022-03-21: 40 meq via ORAL
  Filled 2022-03-21: qty 2

## 2022-03-21 MED ORDER — AMLODIPINE BESYLATE 10 MG PO TABS
10.0000 mg | ORAL_TABLET | Freq: Every day | ORAL | Status: DC
  Administered 2022-03-21 – 2022-03-24 (×4): 10 mg via ORAL
  Filled 2022-03-21: qty 2
  Filled 2022-03-21 (×3): qty 1

## 2022-03-21 NOTE — ED Notes (Signed)
Pt transported to echo 

## 2022-03-21 NOTE — ED Notes (Signed)
Patient actively complaining of pain and had a witnessed run of PVCs on the monitor. Crash cart was moved to patient room at this time. EDP Horton confirmed she did not think the EKG strip which was printed was artifact. MD Rathore made aware and advised to call Babs Bertin due to her being with another patient at this time. Babs Bertin paged by Network engineer.

## 2022-03-21 NOTE — ED Notes (Signed)
Patient noted to have an approx 6 second run of vtach on monitor.  Patient remained CAOx4 during entire event, denies chest pain/shob.  Hospitalist paged and made aware.

## 2022-03-21 NOTE — ED Notes (Signed)
Dr Velia Meyer to come to patient room per phone call. Md was advised of no PRN medication orders and patients persistant pain. MD was also made aware of patients run of PVCs.   Md rathore in patient room at this time. Rathore contacted cardiology. Patient consistantly tachycardic and Mds had been made aware throughout the night. Patient to be Kept NPO per MD and CT when available.

## 2022-03-21 NOTE — Progress Notes (Addendum)
Rounding Note    Patient Name: Shirley [name] Date of Encounter: 03/21/2022  [facility] Cardiologist: Mertie Moores, MD new  Subjective   No more N&V, no CP  Inpatient Medications    Scheduled Meds:  amLODipine  10 mg Oral Daily   aspirin  81 mg Oral Daily   atorvastatin  40 mg Oral Daily   carvedilol  6.25 mg Oral BID WC   insulin aspart  0-9 Units Subcutaneous Q4H   metoCLOPramide (REGLAN) injection  10 mg Intravenous Q8H   pantoprazole (PROTONIX) IV  40 mg Intravenous Q24H   Continuous Infusions:  sodium chloride 150 mL/hr at 03/21/22 0541   heparin 1,500 Units/hr (03/21/22 0253)   potassium chloride Stopped (03/21/22 0725)   PRN Meds: acetaminophen **OR** acetaminophen, fentaNYL (SUBLIMAZE) injection, hydrALAZINE, labetalol, naLOXone (NARCAN)  injection, ondansetron (ZOFRAN) IV   Vital Signs    Vitals:   03/21/22 0345 03/21/22 0400 03/21/22 0415 03/21/22 0607  BP: (!) 195/115 (!) 189/127 (!) 203/109   Pulse: (!) 146 (!) 139 (!) 142   Resp: (!) 26 (!) 24 (!) 25   Temp:    98.5 F (36.9 C)  TempSrc:    Oral  SpO2: 100% 98% 100%   Weight:      Height:        Intake/Output Summary (Last 24 hours) at 03/21/2022 0804 Last data filed at 03/21/2022 0414 Gross per 24 hour  Intake 3000.06 ml  Output 1900 ml  Net 1100.06 ml      03/21/2022    2:16 AM 06/01/2021    4:22 PM 04/26/2021    5:00 AM  Last 3 Weights  Weight (lbs) 250 lb 249 lb 12.5 oz 249 lb 12.5 oz  Weight (kg) 113.399 kg 113.3 kg 113.3 kg       Telemetry    SR, freq PVCs - Personally Reviewed  ECG    None today - Personally Reviewed  Physical Exam   GEN: moves constantly, unless activity focused Neck: No JVD Cardiac: RRR, 2/6 murmur, no rubs, or gallops.  Respiratory: Clear to auscultation bilaterally. GI: Soft, nontender, non-distended  MS: No edema; No deformity. Neuro:  Nonfocal  Psych: Normal affect   Labs    High Sensitivity Troponin:   Recent Labs  Lab  03/20/22 1847 03/20/22 2110  TROPONINIHS 207* 190*     Chemistry Recent Labs  Lab 03/20/22 1847 03/21/22 0600  NA 143 141  K 3.6 3.2*  CL 107 111  CO2 22 21*  GLUCOSE 147* 126*  BUN 41* 40*  CREATININE 2.67* 2.46*  CALCIUM 9.2 8.1*  MG  --  1.8  PROT 6.7  --   ALBUMIN 2.5*  --   AST 36  --   ALT 12  --   ALKPHOS 66  --   BILITOT 0.8  --   GFRNONAA 21* 24*  ANIONGAP 14 9    Lipids No results for input(s): "CHOL", "TRIG", "HDL", "LABVLDL", "LDLCALC", "CHOLHDL" in the last 168 hours.  Hematology Recent Labs  Lab 03/20/22 1847  WBC 15.5*  RBC 4.79  HGB 12.5  HCT 37.1  MCV 77.5*  MCH 26.1  MCHC 33.7  RDW 15.4  PLT 435*   Thyroid No results for input(s): "TSH", "FREET4" in the last 168 hours.  BNP Recent Labs  Lab 03/21/22 0600  BNP 462.5*    DDimer  Recent Labs  Lab 03/20/22 2227  DDIMER 0.93*    Drugs of Abuse  Component Value Date/Time   LABOPIA NONE DETECTED 03/21/2022 0222   COCAINSCRNUR NONE DETECTED 03/21/2022 0222   COCAINSCRNUR NEGATIVE 02/21/2010 2201   LABBENZ NONE DETECTED 03/21/2022 0222   LABBENZ NEGATIVE 02/21/2010 2201   AMPHETMU NONE DETECTED 03/21/2022 0222   THCU NONE DETECTED 03/21/2022 0222   LABBARB NONE DETECTED 03/21/2022 0222      Radiology    CT ABDOMEN PELVIS WO CONTRAST  Result Date: 03/21/2022 CLINICAL DATA:  46 year old female with history of nonlocalized abdominal pain. EXAM: CT ABDOMEN AND PELVIS WITHOUT CONTRAST TECHNIQUE: Multidetector CT imaging of the abdomen and pelvis was performed following the standard protocol without IV contrast. RADIATION DOSE REDUCTION: This exam was performed according to the departmental dose-optimization program which includes automated exposure control, adjustment of the mA and/or kV according to patient size and/or use of iterative reconstruction technique. COMPARISON:  CT of the chest, abdomen and pelvis 04/16/2021. FINDINGS: Lower chest: Unremarkable. Hepatobiliary: Diffuse low  attenuation throughout the hepatic parenchyma, indicative of a background of hepatic steatosis. No definite suspicious cystic or solid hepatic lesions are confidently identified on today's noncontrast CT examination. Unenhanced appearance of the gallbladder is unremarkable. Pancreas: No definite pancreatic mass or peripancreatic fluid collections or inflammatory changes are noted on today's noncontrast CT examination. Spleen: Unremarkable. Adrenals/Urinary Tract: There are no abnormal calcifications within the collecting system of either kidney, along the course of either ureter, or within the lumen of the urinary bladder. No hydroureteronephrosis or perinephric stranding to suggest urinary tract obstruction at this time. The unenhanced appearance of the kidneys is unremarkable bilaterally. Unenhanced appearance of the urinary bladder is normal. Bilateral adrenal glands are normal in appearance. Stomach/Bowel: Unenhanced appearance of the stomach is normal. There is no pathologic dilatation of small bowel or colon. Normal appendix. Vascular/Lymphatic: Atherosclerotic calcifications in the abdominal aorta. No lymphadenopathy noted in the abdomen or pelvis. Reproductive: Uterus and ovaries are unremarkable in appearance. Other: No significant volume of ascites.  No pneumoperitoneum. Musculoskeletal: There are no aggressive appearing lytic or blastic lesions noted in the visualized portions of the skeleton. IMPRESSION: 1. No acute findings are noted in the abdomen or pelvis to account for the patient's symptoms. 2. Hepatic steatosis. 3. Aortic atherosclerosis. Electronically Signed   By: Vinnie Langton M.D.   On: 03/21/2022 06:57   DG Chest Portable 1 View  Result Date: 03/20/2022 CLINICAL DATA:  Chest pain EXAM: PORTABLE CHEST 1 VIEW COMPARISON:  04/20/2021 FINDINGS: The heart size is mildly enlarged. Both lungs are clear. The visualized skeletal structures are unremarkable. IMPRESSION: Mild cardiomegaly. No  acute cardiopulmonary findings. Electronically Signed   By: Davina Poke D.O.   On: 03/20/2022 21:31    Cardiac Studies   ECHO: results pending  Patient Profile     46 y.o. female w/ hx MI s/p PDA balloon angioplasty 8299, HLD, B7JI complicated by chronic, recurrent gastroparesis, HTN, CKD IIIb, CVA, GAD, MDD, chronic Hep B, stimulant use disorder, was admitted 11/27 with refractory N&V, elevated troponin.  Cards asked to see for elevated troponin.    Assessment & Plan    Elevated troponin - pt had some chest pain with the vomiting, pressure and sharp - mild trop elevation - echo pending - hx CAD w/ PTCA PDA 2008, no sig CAD at cath 2010 - review echo and then decide on MV (only viable test w/ elevated Cr)  2. N&V - Hx of this 2nd gastroparesis - improved w/ RX - per IM  3. HTN - could not keep down meds  due to N&V - need to be aggressive w/ the IV hydralazine until she can take po meds - UDS neg now, but hx cocaine use, metop changed to Coreg - Otherwise, per IM   For questions or updates, please contact [place] Please consult [email or web address] for contact info under        Signed, Rosaria Ferries, PA-C  03/21/2022, 8:04 AM    Attending Note:   The patient was seen and examined.  Agree with assessment and plan as noted above.  Changes made to the above note as needed.  Patient seen and independently examined with Rosaria Ferries, PA .   We discussed all aspects of the encounter. I agree with the assessment and plan as stated above.    Pulmonary embolus :  acute, unprovoked. Diagnosed with high Prob V/Q scan  No leg pain or abdominal pain   On heparin  Would transition to eliquis or xarelto  ( high dose - PE protocol )   She can see [place] as needed   [place] will sign off.   Medication Recommendations:  high dose ( PE protocol) Eliquis or Xarelto  Other recommendations (labs, testing, etc):   Follow up as an outpatient:  with  cardiology as needed.   She had stenting years ano and has not had any issues recently   She needs to completely stop using cocaine.       I have spent a total of 40 minutes with patient reviewing hospital  notes , telemetry, EKGs, labs and examining patient as well as establishing an assessment and plan that was discussed with the patient.  > 50% of time was spent in direct patient care.    Thayer Headings, Brooke Bonito., MD, [facility] 03/21/2022, 1:51 PM [address],  [place] Pager [phone]

## 2022-03-21 NOTE — ED Notes (Signed)
Patient reports her left eye has been swollen and itching since last night.  Patient's eyelid noted to be swollen, conjunctiva pink.  Sclera white.  Hospitalist paged and made aware.

## 2022-03-21 NOTE — ED Notes (Signed)
Daughter Laprecious Memmer [phone] would like an update asap

## 2022-03-21 NOTE — ED Notes (Signed)
MD Howerter at bedside currently. MD assessing patient and advised that he placed PRN pain management and nausea medications. Md stated he would start the patient on potassium due to patients run of Vtach. Patient given pain and nausea meds and resting at this time. Patient also encouraged to stay NPO until advised. Female friend of patient at bedside also advised the same of the patient.

## 2022-03-21 NOTE — ED Notes (Signed)
Pt noted to have 13 PVCs on cardiac monitor. Pt c/o abd/chest pain, requesting pain medicaiton. Dr.Rathore paged

## 2022-03-21 NOTE — H&P (Signed)
History and Physical    Shirley [name] ZHG:[id number] DOB: 1976/03/14 DOA: 03/20/2022  PCP: Selena Batten, NP  Patient coming from: Home  Chief Complaint: Chest pain  HPI: Shirley [name] is a 46 y.o. female with medical history significant of insulin-dependent type 2 diabetes, gastroparesis, recurrent abscesses, hypertension, medical noncompliance, CKD stage IIIb.,  OSA on CPAP, CAD status post PCI, CVA, anxiety, depression, fibromyalgia, chronic hep B, hyperlipidemia, substance abuse (cocaine and marijuana), GERD, obesity presented to the ED with chest pain, nausea, and vomiting.  Noted to be hypertensive with systolic in the 196Q on arrival and tachycardic with heart rate 140-150.  Oxygen saturation 98-100% on room air.  She received aspirin 324 mg and nitroglycerin x 1 with EMS.  Labs showing WBC 15.5 (chronically elevated and improved), hemoglobin 12.5, platelet count 435k (chronically elevated and improved), creatinine 2.6 (was 2.4 in April 2023 and previously 1.6 in February 2023), LFTs normal, high-sensitivity troponin elevated but stable (207> 190), UDS pending, D-dimer 0.93.  Chest x-ray showing mild cardiomegaly and no acute findings. Patient was given Benadryl, Haldol, and 2 L fluid boluses.  Cardiology consulted.  TRH called to admit.  Patient is a poor historian.  Reports 3-day history of nausea and nonbloody emesis.  Endorsing epigastric and left lower quadrant abdominal pain.  Denies diarrhea.  Not endorsing any urinary symptoms.  She has not been able to tolerate p.o. intake.  She stopped taking her home blood pressure medications a week ago.  She denies drug/cocaine use.  She is reporting constant sharp substernal chest pain for the past 3 days.  Reports history of DVT/PE in the past but no longer on anticoagulation.  Review of Systems:  Review of Systems  All other systems reviewed and are negative.   Past Medical History:  Diagnosis Date   Abscess of tunica vaginalis     10/09- Abundant S. aureus- sensitive to all abx   Anxiety    Blood dyscrasia    CAD (coronary artery disease) 06/15/2006   s/p Subendocardial MI with PDA angioplasty(no stent) on 06/15/06 and relook  cath 06/19/06 showed patency of site. Cath 12/10- no restenosis or significant CAD progression   CVA (cerebral vascular accident) ([place]) ~ 02/2014   denies residual on 04/22/2014   CVA (cerebral vascular accident) [facility])    history of remote right cerebellar infarct noted on head CT at least since 10/2011   Depression    Diabetes mellitus type 2, uncontrolled, with complications    Fibromyalgia    Gastritis    Gastroparesis    secondary to poorly controlled DM, last emptying study performed 01/2010  was normal but may be falsely positive as pt was on reglan   GERD (gastroesophageal reflux disease)    Hepatitis B, chronic (HCC)    Hep BeAb+,Hep B cAb+ & Hep BsAg+ (9/06)   History of pyelonephritis    H/o GrpB Pyelonephritis (9/06) and UTI- 07/11- E.Coli, 12/10- GBS   Hyperlipidemia    Hypertension    Iron deficiency anemia    Irregular menses    Small ovarian follicles seen on IW(9/79)   MI (myocardial infarction) ([place]) 05/2006   PDA percutaneous transluminal coronary angioplasty   Migraine    "weekly" (04/22/2014)   N&V (nausea and vomiting)    Chronic. Unclear etiology with multiple admission and ED visits. CT abdomen with and without contrast (02/2011)  showed no acute process. Gastic Emptying scan (01/2010) was normal. Ultrasound of the abdomen was within normal limits. Hepatitis B  viral load was undectable. HIV NR. EGD - gastritis, Hpylori + s/p Rx   Obesity    OSA (obstructive sleep apnea)    "suppose to wear mask but I don't" (04/22/2014)   Peripheral neuropathy    Pneumonia    "this is probably the 2nd or 3rd time I've had pneumonia" (04/22/2014)   Recurrent boils    Seasonal asthma    Substance abuse ([place])    Thrombocytosis    Hem/Onc suggested 2/2 chronic hepatits and/or iron  deficiency anemia    Past Surgical History:  Procedure Laterality Date   [place]  2008   "2 stents"   ESOPHAGOGASTRODUODENOSCOPY N/A 04/23/2014   Procedure: ESOPHAGOGASTRODUODENOSCOPY (EGD);  Surgeon: Winfield Cunas., MD;  Location: [facility] ENDOSCOPY;  Service: Endoscopy;  Laterality: N/A;   INCISION AND DRAINAGE ABSCESS Right 04/17/2021   Procedure: INCISION AND DRAINAGE CHEST WALL ABSCESS;  Surgeon: Jesusita Oka, MD;  Location: [place];  Service: General;  Laterality: Right;   IR ANGIOGRAM PELVIS SELECTIVE OR SUPRASELECTIVE  12/07/2016   IR ANGIOGRAM PELVIS SELECTIVE OR SUPRASELECTIVE  12/07/2016   IR ANGIOGRAM SELECTIVE EACH ADDITIONAL VESSEL  12/07/2016   IR ANGIOGRAM SELECTIVE EACH ADDITIONAL VESSEL  12/07/2016   IR EMBO ARTERIAL NOT HEMORR HEMANG INC GUIDE ROADMAPPING  12/07/2016   IR RADIOLOGIST EVAL & MGMT  01/09/2017   IR US GUIDE VASC ACCESS RIGHT  12/07/2016   IRRIGATION AND DEBRIDEMENT FOOT Right 04/07/2020   Procedure: Incision and Drainage Right Foot;  Surgeon: Evelina Bucy, DPM;  Location: [facility];  Service: Podiatry;  Laterality: Right;     reports that she quit smoking about 25 years ago. Her smoking use included cigarettes. She has never used smokeless tobacco. She reports that she does not currently use drugs after having used the following drugs: Marijuana and Cocaine. She reports that she does not drink alcohol.  Allergies  Allergen Reactions   Lisinopril Nausea Only and Swelling   Morphine And Related Itching and Swelling    Family History  Problem Relation Age of Onset   Diabetes Father    Healthy Mother     Prior to Admission medications   Medication Sig Start Date End Date Taking? Authorizing Provider  acetaminophen (TYLENOL) 500 MG tablet Take 1,000 mg by mouth every 6 (six) hours as needed for moderate pain.   Yes [provider]  albuterol (VENTOLIN HFA) 108 (90 Base) MCG/ACT inhaler  Inhale 2 puffs into the lungs every 4 (four) hours as needed for wheezing or shortness of breath. 11/30/19   Domenic Polite, MD  amLODipine (NORVASC) 10 MG tablet Take 1 tablet (10 mg total) by mouth daily. 04/26/21   Aline August, MD  atorvastatin (LIPITOR) 20 MG tablet Take 20 mg by mouth at bedtime. 04/11/21   [provider]  blood glucose meter kit and supplies KIT Dispense based on patient and insurance preference. Use up to four times daily as directed. (FOR ICD-9 250.00, 250.01). 01/04/20   Khatri, Hina, PA-C  Blood Glucose Monitoring Suppl (ONETOUCH VERIO REFLECT) w/Device KIT 1 each by Does not apply route 3 (three) times daily. 12/04/18   Kerin Perna, NP  cyclobenzaprine (FLEXERIL) 10 MG tablet Take 10 mg by mouth 3 (three) times daily as needed for muscle spasms.    [provider]  dicyclomine (BENTYL) 10 MG capsule Take 1 capsule (10 mg total) by mouth 4 (four) times daily -  before meals  and at bedtime. 08/20/21   Fransico Meadow, PA-C  diphenhydrAMINE (BENADRYL) 25 MG tablet Take 25 mg by mouth every 6 (six) hours as needed for allergies.    [provider]  famotidine (PEPCID) 20 MG tablet Take 20 mg by mouth every evening. 04/11/21   [provider]  furosemide (LASIX) 40 MG tablet Take 1 tablet (40 mg total) by mouth daily for 7 days. 04/26/21 05/03/21  Aline August, MD  gabapentin (NEURONTIN) 400 MG capsule Take 1 capsule (400 mg total) by mouth daily at 6 (six) AM. 04/26/21   Aline August, MD  glucose blood (ONETOUCH VERIO) test strip Use as instructed to check blood sugar 3 times daily. Dx codes: E11.8, E11.65 12-29-18   Charlott Rakes, MD  HUMALOG KWIKPEN 100 UNIT/ML KwikPen Inject 15 Units into the skin 4 (four) times daily -  before meals and at bedtime. Hold if blood glucose is less than 110 11/11/19   [provider]  Insulin Pen Needle (PEN NEEDLES 3/16") 31G X 5 MM MISC 1 Device by Does not apply route 4 (four) times daily -  after meals and at bedtime. 11/28/18   Kerin Perna, NP  INSULIN SYRINGE .5CC/28G (INS SYRINGE/NEEDLE .5CC/28G) 28G X 1/2" 0.5 ML MISC Please provide 1 month supply 09/04/17   Colbert Ewing, MD  meclizine (ANTIVERT) 25 MG tablet Take 1 tablet (25 mg total) by mouth 3 (three) times daily as needed for dizziness. Patient not taking: Reported on 08/20/2021 04/26/21   Aline August, MD  methocarbamol (ROBAXIN) 500 MG tablet Take 1 tablet (500 mg total) by mouth every 6 (six) hours as needed for muscle spasms. Patient not taking: Reported on 08/20/2021 04/26/21   Aline August, MD  metoCLOPramide (REGLAN) 10 MG tablet Take 1 tablet (10 mg total) by mouth 3 (three) times daily with meals. 08/20/21 08/20/22  Fransico Meadow, PA-C  metoprolol succinate (TOPROL-XL) 50 MG 24 hr tablet Take 50 mg by mouth daily. 05/16/21   [provider]  MOUNJARO 5 MG/0.5ML Pen Inject 0.5 mLs into the skin once a week. 06/15/21   [provider]  omeprazole (PRILOSEC) 40 MG capsule Take 40 mg by mouth daily. 03/10/21   [provider]  OneTouch Delica Lancets 16W MISC Use to check blood sugar 3 times daily. Dx codes: E11.8, E11.65 12-29-18   Charlott Rakes, MD  polyethylene glycol powder (GLYCOLAX/MIRALAX) 17 GM/SCOOP powder Take 17 g by mouth daily as needed. Patient not taking: Reported on 08/20/2021 04/26/21   Aline August, MD  senna (SENOKOT) 8.6 MG TABS tablet Take 1 tablet (8.6 mg total) by mouth 2 (two) times daily. Patient not taking: Reported on 08/20/2021 04/26/21   Aline August, MD    Physical Exam: Vitals:   03/20/22 2300 03/20/22 2330 03/21/22 0000 03/21/22 0030  BP: (!) 192/114 (!) 179/105 (!) 161/98 (!) 182/105  Pulse: (!) 129 (!) 126 (!) 131 (!) 126  Resp: (!) _0 Temp:      TempSrc:      SpO2: 97% 97% 97% 97%    Physical Exam Vitals reviewed.  Constitutional:      General: She is not in acute distress. HENT:     Head: Normocephalic and atraumatic.  Eyes:      Extraocular Movements: Extraocular movements intact.  Cardiovascular:     Rate and Rhythm: Regular rhythm. Tachycardia present.     Pulses: Normal pulses.  Pulmonary:     Effort: Pulmonary effort is normal.  No respiratory distress.     Breath sounds: Normal breath sounds. No wheezing or rales.  Abdominal:     General: Bowel sounds are normal. There is no distension.     Palpations: Abdomen is soft.     Tenderness: There is abdominal tenderness. There is no guarding or rebound.     Comments: Generalized tenderness to palpation  Musculoskeletal:        General: No swelling or tenderness.     Cervical back: Normal range of motion.  Skin:    General: Skin is warm and dry.  Neurological:     General: No focal deficit present.     Mental Status: She is alert and oriented to person, place, and time.     Labs on Admission: I have personally reviewed following labs and imaging studies  CBC: Recent Labs  Lab 03/20/22 1847  WBC 15.5*  NEUTROABS 11.4*  HGB 12.5  HCT 37.1  MCV 77.5*  PLT 578*   Basic Metabolic Panel: Recent Labs  Lab 03/20/22 1847  NA 143  K 3.6  CL 107  CO2 22  GLUCOSE 147*  BUN 41*  CREATININE 2.67*  CALCIUM 9.2   GFR: CrCl cannot be calculated (Unknown ideal weight.). Liver Function Tests: Recent Labs  Lab 03/20/22 1847  AST 36  ALT 12  ALKPHOS 66  BILITOT 0.8  PROT 6.7  ALBUMIN 2.5*   No results for input(s): "LIPASE", "AMYLASE" in the last 168 hours. No results for input(s): "AMMONIA" in the last 168 hours. Coagulation Profile: No results for input(s): "INR", "PROTIME" in the last 168 hours. Cardiac Enzymes: No results for input(s): "CKTOTAL", "CKMB", "CKMBINDEX", "TROPONINI" in the last 168 hours. BNP (last 3 results) No results for input(s): "PROBNP" in the last 8760 hours. HbA1C: No results for input(s): "HGBA1C" in the last 72 hours. CBG: No results for input(s): "GLUCAP" in the last 168 hours. Lipid Profile: No results for  input(s): "CHOL", "HDL", "LDLCALC", "TRIG", "CHOLHDL", "LDLDIRECT" in the last 72 hours. Thyroid Function Tests: No results for input(s): "TSH", "T4TOTAL", "FREET4", "T3FREE", "THYROIDAB" in the last 72 hours. Anemia Panel: No results for input(s): "VITAMINB12", "FOLATE", "FERRITIN", "TIBC", "IRON", "RETICCTPCT" in the last 72 hours. Urine analysis:    Component Value Date/Time   COLORURINE AMBER (A) 08/20/2021 0235   APPEARANCEUR CLOUDY (A) 08/20/2021 0235   LABSPEC 1.029 08/20/2021 0235   PHURINE 5.0 08/20/2021 0235   GLUCOSEU NEGATIVE 08/20/2021 0235   GLUCOSEU > 1000 mg/dL (A) 07/07/2008 2115   HGBUR NEGATIVE 08/20/2021 0235   HGBUR negative 08/12/2008 1444   BILIRUBINUR NEGATIVE 08/20/2021 0235   BILIRUBINUR neg 02/07/2017 1609   KETONESUR NEGATIVE 08/20/2021 0235   PROTEINUR >=300 (A) 08/20/2021 0235   UROBILINOGEN 1.0 02/07/2017 1609   UROBILINOGEN 0.2 11/11/2014 1658   NITRITE NEGATIVE 08/20/2021 0235   LEUKOCYTESUR NEGATIVE 08/20/2021 0235    Radiological Exams on Admission: DG Chest Portable 1 View  Result Date: 03/20/2022 CLINICAL DATA:  Chest pain EXAM: PORTABLE CHEST 1 VIEW COMPARISON:  04/20/2021 FINDINGS: The heart size is mildly enlarged. Both lungs are clear. The visualized skeletal structures are unremarkable. IMPRESSION: Mild cardiomegaly. No acute cardiopulmonary findings. Electronically Signed   By: Davina Poke D.O.   On: 03/20/2022 21:31    EKG: Independently reviewed.  Sinus tachycardia with PVCs.  No acute ischemic changes.  Assessment and Plan  Chest pain History of CAD status post PCI Patient has been seen by cardiology and ACS felt to be less likely given no acute  ischemic changes on EKG and troponins flat (207> 190).  Cardiology feels her symptoms are due to gastroparesis and severe hypertension.  Troponin elevation felt to be due to demand ischemia.  Also suspecting ongoing cocaine abuse.  PE is also on the differential given positive D-dimer  and persistent tachycardia.  Patient has received 2 L fluid boluses in the ED but heart rate remains in the 140s.  However, she is not tachypneic or hypoxic (currently satting 100% on room air).  Severe dehydration could also be causing her tachycardia.  In addition, has history of cocaine abuse. -Cardiac monitoring -Avoiding CT angiogram due to renal insufficiency.  Patient has been started on IV heparin and VQ scan ordered to rule out PE. -Echocardiogram -Dose of atorvastatin increased to 40 mg daily and started on aspirin 81 mg daily per cardiology recommendations. -UDS pending  Nausea, vomiting, abdominal pain Symptoms likely secondary to gastroparesis in the setting of poorly controlled diabetes/noncompliance.  LFTs normal.  Patient is not actively vomiting and requesting fluids to drink, however, endorsing pain in the left lower quadrant of her abdomen. -Continue IV fluid hydration given persistent tachycardia -IV Reglan 10 mg every 8 hours -IV Protonix 40 mg daily -Antiemetic as needed -Stat CT abdomen pelvis ordered  Severe hypertension Secondary to medical noncompliance.  Also has history of cocaine abuse. -Continue amlodipine 10 mg daily -Switched from metoprolol to carvedilol 6.25 mg twice daily per cardiology given concern for ongoing cocaine abuse -Cardiology recommending IV hydralazine PRN -UDS pending  AKI on CKD stage IIIb Likely secondary to dehydration. Creatinine 2.6 (was 2.4 in April 2023 and previously 1.6 in February 2023). -Continue IV fluid hydration -Avoid nephrotoxic agents/contrast -Monitor renal function  Poorly controlled insulin-dependent type 2 diabetes A1c 13.5 on 04/17/2021. -Sensitive sliding scale insulin -Pharmacy med rec pending.  OSA -Continue nightly CPAP  History of CVA -Started on aspirin and dose of statin increased  DVT prophylaxis: IV heparin gtt Code Status: Full Code Family Communication: Patient's boyfriend at bedside. Consults  called: Cardiology Level of care: Progressive Care Unit Admission status: It is my clinical opinion that referral for OBSERVATION is reasonable and necessary in this patient based on the above information provided. The aforementioned taken together are felt to place the patient at high risk for further clinical deterioration. However, it is anticipated that the patient may be medically stable for discharge from the hospital within 24 to 48 hours.   Shela Leff MD [facility]  If 7PM-7AM, please contact night-coverage [email or web address]  03/21/2022, 1:06 AM

## 2022-03-21 NOTE — Progress Notes (Signed)
Notified by cardiology that bedside ultrasound shows patient is still very dry, RV function is robust without signs of PE.  Will continue IV fluid resuscitation.

## 2022-03-21 NOTE — ED Notes (Signed)
Heart Rate the 140's. RN paged admitting MD.

## 2022-03-21 NOTE — Progress Notes (Signed)
ANTICOAGULATION CONSULT NOTE  Pharmacy Consult for heparin Indication: high probability PE on VQ scan  Allergies  Allergen Reactions   Lisinopril Nausea Only and Swelling   Morphine And Related Itching and Swelling    Patient Measurements: Height: '5\' 7"'$  (170.2 cm) Weight: 113.4 kg (250 lb)  IBW/kg (Calculated) : 61.6 Heparin Dosing Weight: 90kg  Vital Signs: Temp: 98.2 F (36.8 C) (11/28 1523) Temp Source: Oral (11/28 1523) BP: 163/98 (11/28 1523) Pulse Rate: 121 (11/28 1523)  Labs: Recent Labs    03/20/22 1847 03/20/22 2110 03/21/22 0600 03/21/22 1445  HGB 12.5  --   --   --   HCT 37.1  --   --   --   PLT 435*  --   --   --   HEPARINUNFRC  --   --   --  0.85*  CREATININE 2.67*  --  2.46*  --   TROPONINIHS 207* 190*  --   --      Estimated Creatinine Clearance: 36.3 mL/min (A) (by C-G formula based on SCr of 2.46 mg/dL (H)).   Medical History: Past Medical History:  Diagnosis Date   Abscess of tunica vaginalis    10/09- Abundant S. aureus- sensitive to all abx   Anxiety    Blood dyscrasia    CAD (coronary artery disease) 06/15/2006   s/p Subendocardial MI with PDA angioplasty(no stent) on 06/15/06 and relook  cath 06/19/06 showed patency of site. Cath 12/10- no restenosis or significant CAD progression   CVA (cerebral vascular accident) ([place]) ~ 02/2014   denies residual on 04/22/2014   [facility]cere[facility] accident) [facility])    history of remote right cerebellar infarct noted on head CT at least since 10/2011   Depression    Diabetes mellitus type 2, uncontrolled, with complications    Fibromyalgia    Gastritis    Gastroparesis    secondary to poorly controlled DM, last emptying study performed 01/2010  was normal but may be falsely positive as pt was on reglan   GERD (gastroesophageal reflux disease)    Hepatitis B, chronic (HCC)    Hep BeAb+,Hep B cAb+ & Hep BsAg+ (9/06)   History of pyelonephritis    H/o GrpB Pyelonephritis (9/06) and UTI- 07/11-  E.Coli, 12/10- GBS   Hyperlipidemia    Hypertension    Iron deficiency anemia    Irregular menses    Small ovarian follicles seen on UU(7/25)   MI (myocardial infarction) ([place]) 05/2006   PDA percutaneous transluminal coronary angioplasty   Migraine    "weekly" (04/22/2014)   N&V (nausea and vomiting)    Chronic. Unclear etiology with multiple admission and ED visits. CT abdomen with and without contrast (02/2011)  showed no acute process. Gastic Emptying scan (01/2010) was normal. Ultrasound of the abdomen was within normal limits. Hepatitis B viral load was undectable. HIV NR. EGD - gastritis, Hpylori + s/p Rx   Obesity    OSA (obstructive sleep apnea)    "suppose to wear mask but I don't" (04/22/2014)   Peripheral neuropathy    Pneumonia    "this is probably the 2nd or 3rd time I've had pneumonia" (04/22/2014)   Recurrent boils    Seasonal asthma    Substance abuse ([place])    Thrombocytosis    Hem/Onc suggested 2/2 chronic hepatits and/or iron deficiency anemia    Assessment: 46yo femalen on heparin for PE - high probability on VQ scan. [place] negative for DVT. Heparin level 0.85 (supratherapeutic) on infusion at  1500 units/hr. No bleeding noted.  Goal of Therapy:  Heparin level 0.3-0.7 units/ml Monitor platelets by anticoagulation protocol: Yes   Plan:  Decrease heparin infusiuon to 1350 units/hr Will f/u 8 hr heparin level  Sherlon Handing, PharmD, BCPS Please see amion for complete clinical pharmacist phone list 03/21/2022,4:19 PM

## 2022-03-21 NOTE — Progress Notes (Addendum)
This morning patient had an episode of V. tach where she desatted to the 80s.  This was witnessed by nursing staff.  Patient seen at bedside, awake and alert.  Currently in sinus rhythm and heart rate in the 130s.  Blood pressure remains elevated with systolic in the 417B.  UDS came back negative.  Discussed with cardiology, planning on doing bedside echo due to concern for PE.  She is on IV heparin.  Checking BNP and mag.  She was complaining of lower abdominal pain earlier which could be explained by urinary retention as her bladder scan was showing > 1 L and underwent In-N-Out cath.  Abdominal exam currently benign.

## 2022-03-21 NOTE — Progress Notes (Addendum)
No charge note  Patient seen and examined this morning, admitted overnight, H&P reviewed and agree with the assessment and plan.  46 year old female with poorly controlled DM2, gastroparesis, HTN, medical noncompliance, CKD 3B with a baseline creatinine most recently at 2.4 in April 2023, chronic hep B, substance abuse in the past with cocaine and marijuana, comes into the hospital with chest pain, nausea, vomiting.  She was noted to be significantly hypertensive with systolic above 004, and also tachycardic with rates in the 140s-150s.  Patient had mild troponin elevation, nonsustained VT and cardiology was consulted as well.  She is doing a Fluegel bit better this morning.  Blood pressure looks better on the monitor in the 599H systolic.  No nausea or vomiting.  Cardiology to see, she is going to get a 2D echo this morning.  Awaiting further plans per cardiology, if no further testing is indicated I will allow clear liquid diet.  Provide supportive care for gastroparesis, and continue aggressive blood pressure control  Addendum: VQ scan with high probability PE. Already on anticoagulation. D/w cardiology, no further workup. Will allow clear liquid diet, advance as tolerated.   Kianni Lheureux M. Cruzita Lederer, MD, PhD [facility]  Between 7 am - 7 pm you can contact me via Amion (for emergencies) or [place] (non urgent matters).  I am not available 7 pm - 7 am, please contact night coverage MD/APP via Amion

## 2022-03-21 NOTE — ED Notes (Signed)
CT called at this time to determine the timeframe for taking patient. I was advised that previous nurse krystal had been contacted for a Urine preg order to be obtained prior to CT. Having just send urine specimen to lab, I placed a urine preg order on the patient and called Lab to add that on to already collected urine.

## 2022-03-21 NOTE — ED Notes (Signed)
Patient complaining of abdominal pain. Patient has not urinated since she has been at the hospital. Patient has been on a purewick with no output although patient stated she felt like she needed to urinate. MD Marlowe Sax was made aware patient current pain status. This paramedic and [place] RN went into patient room and completed an in and out cath on the patient with about 1062m output.

## 2022-03-21 NOTE — Progress Notes (Signed)
Patient refused CPAP use. States she does not wear it at home and doesn't wish to wear one. Vital signs stable at this time.

## 2022-03-21 NOTE — Progress Notes (Signed)
ANTICOAGULATION CONSULT NOTE - Initial Consult  Pharmacy Consult for heparin Indication:  r/o VTE  Allergies  Allergen Reactions   Lisinopril Nausea Only and Swelling   Morphine And Related Itching and Swelling    Patient Measurements: Height: '5\' 7"'$  (170.2 cm) Weight: 113.4 kg (250 lb)  IBW/kg (Calculated) : 61.6 Heparin Dosing Weight: 90kg  Vital Signs: Temp: 98.1 F (36.7 C) (11/28 0216) Temp Source: Oral (11/28 0216) BP: 180/118 (11/28 0200) Pulse Rate: 137 (11/28 0200)  Labs: Recent Labs    03/20/22 1847 03/20/22 2110  HGB 12.5  --   HCT 37.1  --   PLT 435*  --   CREATININE 2.67*  --   TROPONINIHS 207* 190*    Estimated Creatinine Clearance: 33.5 mL/min (A) (by C-G formula based on SCr of 2.67 mg/dL (H)).   Medical History: Past Medical History:  Diagnosis Date   Abscess of tunica vaginalis    10/09- Abundant S. aureus- sensitive to all abx   Anxiety    Blood dyscrasia    CAD (coronary artery disease) 06/15/2006   s/p Subendocardial MI with PDA angioplasty(no stent) on 06/15/06 and relook  cath 06/19/06 showed patency of site. Cath 12/10- no restenosis or significant CAD progression   CVA (cerebral vascular accident) ([place]) ~ 02/2014   denies residual on 04/22/2014   CVA (cerebral vascular accident) [facility])    history of remote right cerebellar infarct noted on head CT at least since 10/2011   Depression    Diabetes mellitus type 2, uncontrolled, with complications    Fibromyalgia    Gastritis    Gastroparesis    secondary to poorly controlled DM, last emptying study performed 01/2010  was normal but may be falsely positive as pt was on reglan   GERD (gastroesophageal reflux disease)    Hepatitis B, chronic (HCC)    Hep BeAb+,Hep B cAb+ & Hep BsAg+ (9/06)   History of pyelonephritis    H/o GrpB Pyelonephritis (9/06) and UTI- 07/11- E.Coli, 12/10- GBS   Hyperlipidemia    Hypertension    Iron deficiency anemia    Irregular menses    Small ovarian  follicles seen on AJ(6/81)   MI (myocardial infarction) ([place]) 05/2006   PDA percutaneous transluminal coronary angioplasty   Migraine    "weekly" (04/22/2014)   N&V (nausea and vomiting)    Chronic. Unclear etiology with multiple admission and ED visits. CT abdomen with and without contrast (02/2011)  showed no acute process. Gastic Emptying scan (01/2010) was normal. Ultrasound of the abdomen was within normal limits. Hepatitis B viral load was undectable. HIV NR. EGD - gastritis, Hpylori + s/p Rx   Obesity    OSA (obstructive sleep apnea)    "suppose to wear mask but I don't" (04/22/2014)   Peripheral neuropathy    Pneumonia    "this is probably the 2nd or 3rd time I've had pneumonia" (04/22/2014)   Recurrent boils    Seasonal asthma    Substance abuse (HCC)    Thrombocytosis    Hem/Onc suggested 2/2 chronic hepatits and/or iron deficiency anemia    Assessment: 46yo female c/o CP/SOB associated w/ N/V, D-dimer and troponin found to be elevated, hypertensive after stopping BP meds a week ago, to start heparin during further w/u.  Goal of Therapy:  Heparin level 0.3-0.7 units/ml Monitor platelets by anticoagulation protocol: Yes   Plan:  Heparin 3000 units IV bolus x1 followed by infusiuon at 1500 units/hr. Monitor heparin levels and CBC.  Wynona Neat, PharmD,  BCPS  03/21/2022,2:22 AM

## 2022-03-22 ENCOUNTER — Inpatient Hospital Stay (HOSPITAL_COMMUNITY): Payer: Medicare Other

## 2022-03-22 ENCOUNTER — Encounter (HOSPITAL_COMMUNITY): Payer: Self-pay | Admitting: Internal Medicine

## 2022-03-22 ENCOUNTER — Other Ambulatory Visit (HOSPITAL_COMMUNITY): Payer: Self-pay

## 2022-03-22 ENCOUNTER — Telehealth (HOSPITAL_COMMUNITY): Payer: Self-pay

## 2022-03-22 DIAGNOSIS — I2699 Other pulmonary embolism without acute cor pulmonale: Secondary | ICD-10-CM

## 2022-03-22 DIAGNOSIS — R079 Chest pain, unspecified: Secondary | ICD-10-CM | POA: Diagnosis not present

## 2022-03-22 DIAGNOSIS — I82431 Acute embolism and thrombosis of right popliteal vein: Secondary | ICD-10-CM

## 2022-03-22 LAB — CBC
HCT: 31.7 % — ABNORMAL LOW (ref 36.0–46.0)
Hemoglobin: 11.4 g/dL — ABNORMAL LOW (ref 12.0–15.0)
MCH: 27.1 pg (ref 26.0–34.0)
MCHC: 36 g/dL (ref 30.0–36.0)
MCV: 75.3 fL — ABNORMAL LOW (ref 80.0–100.0)
Platelets: 368 10*3/uL (ref 150–400)
RBC: 4.21 MIL/uL (ref 3.87–5.11)
RDW: 15.5 % (ref 11.5–15.5)
WBC: 12.7 10*3/uL — ABNORMAL HIGH (ref 4.0–10.5)
nRBC: 0 % (ref 0.0–0.2)

## 2022-03-22 LAB — GLUCOSE, CAPILLARY
Glucose-Capillary: 134 mg/dL — ABNORMAL HIGH (ref 70–99)
Glucose-Capillary: 149 mg/dL — ABNORMAL HIGH (ref 70–99)
Glucose-Capillary: 113 mg/dL — ABNORMAL HIGH (ref 70–99)
Glucose-Capillary: 119 mg/dL — ABNORMAL HIGH (ref 70–99)
Glucose-Capillary: 103 mg/dL — ABNORMAL HIGH (ref 70–99)
Glucose-Capillary: 164 mg/dL — ABNORMAL HIGH (ref 70–99)

## 2022-03-22 LAB — URINE CULTURE
Culture: >=100000 — AB
Special Requests: NORMAL

## 2022-03-22 LAB — MRSA NEXT GEN BY PCR, NASAL: MRSA by PCR Next Gen: DETECTED — AB

## 2022-03-22 LAB — HEPARIN LEVEL (UNFRACTIONATED)
Heparin Unfractionated: 0.64 IU/mL (ref 0.30–0.70)
Heparin Unfractionated: 0.68 IU/mL (ref 0.30–0.70)
Heparin Unfractionated: 0.79 IU/mL — ABNORMAL HIGH (ref 0.30–0.70)

## 2022-03-22 MED ORDER — MUPIROCIN 2 % EX OINT
1.0000 | TOPICAL_OINTMENT | Freq: Two times a day (BID) | CUTANEOUS | Status: DC
  Administered 2022-03-22 – 2022-03-24 (×5): 1 via NASAL
  Filled 2022-03-22: qty 22

## 2022-03-22 MED ORDER — DICYCLOMINE HCL 10 MG PO CAPS
10.0000 mg | ORAL_CAPSULE | Freq: Three times a day (TID) | ORAL | Status: DC
  Administered 2022-03-22 – 2022-03-24 (×7): 10 mg via ORAL
  Filled 2022-03-22 (×7): qty 1

## 2022-03-22 MED ORDER — LACTATED RINGERS IV BOLUS
1000.0000 mL | Freq: Once | INTRAVENOUS | Status: AC
  Administered 2022-03-22: 1000 mL via INTRAVENOUS

## 2022-03-22 MED ORDER — CHLORHEXIDINE GLUCONATE CLOTH 2 % EX PADS
6.0000 | MEDICATED_PAD | Freq: Every day | CUTANEOUS | Status: DC
  Administered 2022-03-22 – 2022-03-24 (×3): 6 via TOPICAL

## 2022-03-22 MED ORDER — FAMOTIDINE 20 MG PO TABS
20.0000 mg | ORAL_TABLET | Freq: Every evening | ORAL | Status: DC
  Administered 2022-03-22 – 2022-03-23 (×2): 20 mg via ORAL
  Filled 2022-03-22 (×2): qty 1

## 2022-03-22 MED ORDER — LACTATED RINGERS IV SOLN: INTRAVENOUS | Status: DC

## 2022-03-22 NOTE — TOC Benefit Eligibility Note (Signed)
Patient Teacher, English as a foreign language completed.    The patient is currently admitted and upon discharge could be taking XARELTO 20 MG TAB. The patient is currently admitted and upon discharge could be taking ELIQUIS 5 MG TAB The current 30 day co-pay is $0 for XARELTO The current 30 day co-pay is $0 for [facility]  The patient is insured through [employer]/PART D   Karie Soda, [place] Patient Advocate Specialist Direct Number: [phone] Fax: [phone]

## 2022-03-22 NOTE — Progress Notes (Signed)
   03/22/22 0100  Assess: MEWS Score  Temp 98.6 F (37 C)  BP (!) 161/101  MAP (mmHg) 112  Pulse Rate (!) 128  ECG Heart Rate (!) 128  Resp 18  SpO2 93 %  O2 Device Room Air  Assess: MEWS Score  MEWS Temp 0  MEWS Systolic 0  MEWS Pulse 2  MEWS RR 0  MEWS LOC 0  MEWS Score 2  MEWS Score Color Yellow  Assess: if the MEWS score is Yellow or Red  Were vital signs taken at a resting state? Yes  Focused Assessment No change from prior assessment  Does the patient meet 2 or more of the SIRS criteria? Yes  Does the patient have a confirmed or suspected source of infection? No  MEWS guidelines implemented *See Row Information* Yes  Treat  MEWS Interventions Administered prn meds/treatments  Pain Scale 0-10  Pain Score 0  Take Vital Signs  Increase Vital Sign Frequency  Yellow: Q 2hr X 2 then Q 4hr X 2, if remains yellow, continue Q 4hrs  Escalate  MEWS: Escalate Yellow: discuss with charge nurse/RN and consider discussing with provider and RRT  Notify: Charge Nurse/RN  Name of Charge Nurse/RN Notified Heather, RN  Date Charge Nurse/RN Notified 03/22/22  Time Charge Nurse/RN Notified 0114  Provider Notification  Provider Name/Title Argie Ramming MD  Date Provider Notified 03/22/22  Time Provider Notified 0130  Method of Notification  (Secure chat)  Notification Reason Other (Comment) (HR 130s)  Provider response No new orders  Date of Provider Response 03/22/22  Time of Provider Response 0130  Assess: SIRS CRITERIA  SIRS Temperature  0  SIRS Pulse 1  SIRS Respirations  0  SIRS WBC 1  SIRS Score Sum  2

## 2022-03-22 NOTE — Progress Notes (Signed)
ANTICOAGULATION CONSULT NOTE  Pharmacy Consult for heparin Indication: high probability PE on VQ scan  Allergies  Allergen Reactions   Lisinopril Nausea Only and Swelling   Morphine And Related Itching and Swelling    Patient Measurements: Height: '5\' 7"'$  (170.2 cm) Weight: 113.4 kg (250 lb)  IBW/kg (Calculated) : 61.6 Heparin Dosing Weight: 90kg  Vital Signs: Temp: 98.6 F (37 C) (11/29 0612) Temp Source: Oral (11/29 0612) BP: 134/77 (11/29 0612) Pulse Rate: 125 (11/29 0612)  Labs: Recent Labs    03/20/22 1847 03/20/22 2110 03/21/22 0600 03/21/22 1445 03/21/22 2345 03/22/22 0329  HGB 12.5  --   --   --   --  11.4*  HCT 37.1  --   --   --   --  31.7*  PLT 435*  --   --   --   --  368  HEPARINUNFRC  --   --   --  0.85* 0.68 0.79*  CREATININE 2.67*  --  2.46*  --   --   --   TROPONINIHS 207* 190*  --   --   --   --      Estimated Creatinine Clearance: 31.9 mL/min (A) (by C-G formula based on SCr of 2.46 mg/dL (H)).   Medical History: Past Medical History:  Diagnosis Date   Abscess of tunica vaginalis    10/09- Abundant S. aureus- sensitive to all abx   Anxiety    Blood dyscrasia    CAD (coronary artery disease) 06/15/2006   s/p Subendocardial MI with PDA angioplasty(no stent) on 06/15/06 and relook  cath 06/19/06 showed patency of site. Cath 12/10- no restenosis or significant CAD progression   CVA (cerebral vascular accident) ([place]) ~ 02/2014   denies residual on 04/22/2014   [facility] accident) [facility])    history of remote right cerebellar infarct noted on head CT at least since 10/2011   Depression    Diabetes mellitus type 2, uncontrolled, with complications    Fibromyalgia    Gastritis    Gastroparesis    secondary to poorly controlled DM, last emptying study performed 01/2010  was normal but may be falsely positive as pt was on reglan   GERD (gastroesophageal reflux disease)    Hepatitis B, chronic (HCC)    Hep BeAb+,Hep B cAb+ & Hep BsAg+  (9/06)   History of pyelonephritis    H/o GrpB Pyelonephritis (9/06) and UTI- 07/11- E.Coli, 12/10- GBS   Hyperlipidemia    Hypertension    Iron deficiency anemia    Irregular menses    Small ovarian follicles seen on VP(7/10)   MI (myocardial infarction) ([place]) 05/2006   PDA percutaneous transluminal coronary angioplasty   Migraine    "weekly" (04/22/2014)   N&V (nausea and vomiting)    Chronic. Unclear etiology with multiple admission and ED visits. CT abdomen with and without contrast (02/2011)  showed no acute process. Gastic Emptying scan (01/2010) was normal. Ultrasound of the abdomen was within normal limits. Hepatitis B viral load was undectable. HIV NR. EGD - gastritis, Hpylori + s/p Rx   Obesity    OSA (obstructive sleep apnea)    "suppose to wear mask but I don't" (04/22/2014)   Peripheral neuropathy    Pneumonia    "this is probably the 2nd or 3rd time I've had pneumonia" (04/22/2014)   Recurrent boils    Seasonal asthma    Substance abuse ([place])    Thrombocytosis    Hem/Onc suggested 2/2 chronic hepatits and/or  iron deficiency anemia    Assessment: 46yo femalen on heparin for PE - high probability on VQ scan. [place] negative for DVT.   Heparin level 0.64 (therapeutic) on infusion at 1200 units/hr. No bleeding or IV issues noted.  Goal of Therapy:  Heparin level 0.3-0.7 units/ml Monitor platelets by anticoagulation protocol: Yes   Plan:  Continue heparin infusiuon at 1200 units/hr Daily heparin level and CBC  Erin Hearing PharmD., BCPS Clinical Pharmacist 03/22/2022 7:23 AM

## 2022-03-22 NOTE — Progress Notes (Signed)
Lower extremity venous bilateral study completed.   Please see CV Proc for preliminary results.   Crystallynn Noorani, RDMS, RVT  

## 2022-03-22 NOTE — Telephone Encounter (Signed)
Pharmacy Patient Advocate Encounter  Insurance verification completed.    The patient is insured through [employer]/PART D   The patient is currently admitted and ran test claims for the following: XARELTO 20 MG TAB AND ELIQUIS 5 MG TAB.  Copays and coinsurance results were relayed to Inpatient clinical team.  Karie Soda, [place] Patient Advocate Specialist Direct Number: [phone] Fax: [phone]

## 2022-03-22 NOTE — Progress Notes (Signed)
PROGRESS NOTE    Shirley [name]  ZOX:[id number] DOB: Nov 17, 1975 DOA: 03/20/2022 PCP: Shirley Costa, NP  Chief Complaint  Patient presents with   Chest Pain    Brief Narrative:  Shirley [name] is Shirley [name] 46 y.o. female with medical history significant of insulin-dependent type 2 diabetes, gastroparesis, recurrent abscesses, hypertension, medical noncompliance, CKD stage IIIb.,  OSA on CPAP, CAD status post PCI, CVA, anxiety, depression, fibromyalgia, chronic hep B, hyperlipidemia, substance abuse (cocaine and marijuana), GERD, obesity presented to the ED with chest pain, nausea, and vomiting.  Noted to be hypertensive with systolic in the 190s on arrival and tachycardic with heart rate 140-150.  Oxygen saturation 98-100% on room air.  She received aspirin 324 mg and nitroglycerin x 1 with EMS.  Labs showing WBC 15.5 (chronically elevated and improved), hemoglobin 12.5, platelet count 435k (chronically elevated and improved), creatinine 2.6 (was 2.4 in April 2023 and previously 1.6 in February 2023), LFTs normal, high-sensitivity troponin elevated but stable (207> 190), UDS pending, D-dimer 0.93.  Chest x-ray showing mild cardiomegaly and no acute findings. Patient was given Benadryl, Haldol, and 2 L fluid boluses.  Cardiology consulted.  TRH called to admit.   Patient is Shirley [name] poor historian.  Reports 3-day history of nausea and nonbloody emesis.  Endorsing epigastric and left lower quadrant abdominal pain.  Denies diarrhea.  Not endorsing any urinary symptoms.  She has not been able to tolerate p.o. intake.  She stopped taking her home blood pressure medications Shirley [name] week ago.  She denies drug/cocaine use.  She is reporting constant sharp substernal chest pain for the past 3 days.  Reports history of DVT/PE in the past but no longer on anticoagulation.  Assessment & Plan:   Principal Problem:   Chest pain Active Problems:   Type 2 diabetes mellitus (HCC)   Severe hypertension   Nausea and vomiting    CAD S/P percutaneous coronary angioplasty   Acute-on-chronic kidney injury (HCC)   Acute pulmonary embolism (HCC)  Pulmonary Embolism Explains chest pain below Continue heparin for now EF 45-50%, global hypokinesis, mildly elevated PASP, RVSF normal  LE [place] pending Unclear provoking factor, will continue to investigate  Chest pain History of CAD status post PCI Elevated Troponins Patient has been seen by cardiology and ACS felt to be less likely given no acute ischemic changes on EKG and troponins flat (207> 190).    Troponin elevation felt to be due to demand ischemia.   PE likely explains symptoms and elevated troponin -Echocardiogram as above -Dose of atorvastatin increased to 40 mg daily and started on aspirin 81 mg daily per cardiology recommendations. -UDS pending   Sinus Tachycardia Bolus and follow Suspect volume depleted related to N/V.  Also contribution from PE as well. 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  Also has history of cocaine abuse. Continue amlodipine 10 mg daily Switched from metoprolol to carvedilol 6.25 mg twice daily per cardiology given concern for ongoing cocaine abuse Hold losartan with [name] Cardiology recommending IV hydralazine PRN UDS negative    AKI on CKD stage IIIb Likely secondary to dehydration. Creatinine 2.6 (was 2.4 in April 2023 and previously 1.6 in February 2023). Mildly improved today, will monitor No hydro on imaging Urinalysis pending Follow  with IVF given tachycardia   Poorly controlled insulin-dependent type 2 diabetes A1c 13.5 on 04/17/2021. -Sensitive sliding scale insulin, she's on QID short acting at home, follow needs as her PO  intake improves - holding mounjaro   OSA -Continue nightly CPAP   History of CVA -Started on aspirin and dose of statin increased - Not clear to me hx of this, will need to review at d/c to determine whether to continue aspirin     DVT prophylaxis: heparin gtt Code Status: full Family Communication: none, family asleep at bedside Disposition:   Status is: Inpatient Remains inpatient appropriate because: continued tachycardia   Consultants:  cardiology  Procedures:  Echo IMPRESSIONS     1. Left ventricular ejection fraction, by estimation, is 45 to 50%. The  left ventricle has mildly decreased function. The left ventricle  demonstrates global hypokinesis. There is mild concentric left ventricular  hypertrophy. Indeterminate diastolic  filling due to Shirley [name] fusion.   2. Right ventricular systolic function is normal. The right ventricular  size is normal. There is mildly elevated pulmonary artery systolic  pressure. The estimated right ventricular systolic pressure is 35.9 mmHg.   3. Left atrial size was moderately dilated.   4. Shirley [name] small pericardial effusion is present. The pericardial effusion is  circumferential.   5. The mitral valve is normal in structure. Mild to moderate mitral valve  regurgitation.   6. Tricuspid valve regurgitation is mild to moderate.   7. The aortic valve is tricuspid. Aortic valve regurgitation is not  visualized. No aortic stenosis is present.   LE [place] pending  Antimicrobials:  Anti-infectives (From admission, onward)    None       Subjective: No complaints this morning  Objective: Vitals:   03/22/22 0300 03/22/22 0612 03/22/22 0816 03/22/22 1225  BP: (!) 158/94 134/77 128/82 (!) 151/91  Pulse: (!) 132 (!) 125 (!) 120 (!) 121  Resp:  18 18 18   Temp: 98.7 F (37.1 C) 98.6 F (37 C) 98.3 F (36.8 C)   TempSrc: Oral Oral Oral   SpO2: 95% 94% 93% 94%  Weight:      Height:        Intake/Output Summary (Last 24 hours) at  03/22/2022 1444 Last data filed at 03/22/2022 0816 Gross per 24 hour  Intake 1975.28 ml  Output 600 ml  Net 1375.28 ml   Filed Weights   03/21/22 0216 03/22/22 0100  Weight: 113.4 kg 88.1 kg    Examination:  General exam: Appears calm and comfortable  Respiratory system: Clear to auscultation. Respiratory effort normal. Cardiovascular system: regular, tachycardia Gastrointestinal system: Abdomen is nondistended, soft and nontender.  Central nervous system: Alert and oriented. No focal neurological deficits. Extremities: no LEE   Data Reviewed: I have personally reviewed following labs and imaging studies  CBC: Recent Labs  Lab 03/20/22 1847 03/22/22 0329  WBC 15.5* 12.7*  NEUTROABS 11.4*  --   HGB 12.5 11.4*  HCT 37.1 31.7*  MCV 77.5* 75.3*  PLT 435* 368    Basic Metabolic Panel: Recent Labs  Lab 03/20/22 1847 03/21/22 0600  NA 143 141  K 3.6 3.2*  CL 107 111  CO2 22 21*  GLUCOSE 147* 126*  BUN 41* 40*  CREATININE 2.67* 2.46*  CALCIUM 9.2 8.1*  MG  --  1.8    GFR: Estimated Creatinine Clearance: 31.9 mL/min (Eyana Stolze) (by C-G formula based on SCr of 2.46 mg/dL (H)).  Liver Function Tests: Recent Labs  Lab 03/20/22 1847  AST  36  ALT 12  ALKPHOS 66  BILITOT 0.8  PROT 6.7  ALBUMIN 2.5*    CBG: Recent Labs  Lab 03/21/22 2018 03/22/22 0112 03/22/22 0420 03/22/22 0817 03/22/22 1224  GLUCAP 109* 134* 149* 113* 119*     Recent Results (from the past 240 hour(s))  Urine Culture     Status: Abnormal   Collection Time: 03/21/22  4:09 AM   Specimen: In/Out Cath Urine  Result Value Ref Range Status   Specimen Description IN/OUT CATH URINE  Final   Special Requests Normal  Final   Culture (Shirley [name])  Final    >=100,000 COLONIES/mL GARDNERELLA VAGINALIS Standardized susceptibility testing for this organism is not available. Performed at [facility], [address], [place], [place] [address]    Report Status 03/22/2022 FINAL  Final  MRSA Next Gen  by PCR, Nasal     Status: Abnormal   Collection Time: 03/22/22  1:37 AM   Specimen: Nasal Mucosa; Nasal Swab  Result Value Ref Range Status   MRSA by PCR Next Gen DETECTED (Shalah Estelle) NOT DETECTED Final    Comment: RESULT CALLED TO, READ BACK BY AND VERIFIED WITH: JAMIS,RN@0320  03/22/22 MK (NOTE) The GeneXpert MRSA Assay (FDA approved for NASAL specimens only), is one component of [facility] comprehensive MRSA colonization surveillance program. It is not intended to diagnose MRSA infection nor to guide or monitor treatment for MRSA infections. Test performance is not FDA approved in patients less than 18 years old. Performed at [facility], [address], [place], [place] [address]          Radiology Studies: NM Pulmonary Perfusion  Result Date: 03/21/2022 CLINICAL DATA:  Concern for pulmonary embolism.  Chest pain EXAM: NUCLEAR MEDICINE PERFUSION LUNG SCAN TECHNIQUE: Perfusion images were obtained in multiple projections after intravenous injection of radiopharmaceutical. RADIOPHARMACEUTICALS:  4.0 mCi Tc-41m MAA COMPARISON:  Chest radiograph 03/20/2022 FINDINGS: Large segmental perfusion defect within the superior segment of the RIGHT lower lobe is present on RPO and RAO projections. Chest x-ray is clear. IMPRESSION: Concern for acute pulmonary embolism to the superior segment of the RIGHT lower lobe. High probability Electronically Signed   By: Genevive Bi M.D.   On: 03/21/2022 11:53   ECHOCARDIOGRAM COMPLETE  Result Date: 03/21/2022    ECHOCARDIOGRAM REPORT   Patient Name:   ARAYAH NICKOLAS Prom Date of Exam: 03/21/2022 Medical Rec #:  [id number]        Height:       67.0 in Accession #:    [id number]       Weight:       250.0 lb Date of Birth:  [birth year]        BSA:          2.223 m Patient Age:    46 years         BP:           203/109 mmHg Patient Gender: F                HR:           122 bpm. Exam Location:  Inpatient Procedure: 2D Echo, Cardiac Doppler and Color Doppler Indications:     chest pain  History:        Patient has no prior history of Echocardiogram examinations.                 Risk Factors:Hypertension and Dyslipidemia.  Sonographer:    Melissa Morford RDCS (AE, PE) Referring  Phys: [id number] VASUNDHRA RATHORE IMPRESSIONS  1. Left ventricular ejection fraction, by estimation, is 45 to 50%. The left ventricle has mildly decreased function. The left ventricle demonstrates global hypokinesis. There is mild concentric left ventricular hypertrophy. Indeterminate diastolic filling due to E-Deniel Mcquiston fusion.  2. Right ventricular systolic function is normal. The right ventricular size is normal. There is mildly elevated pulmonary artery systolic pressure. The estimated right ventricular systolic pressure is 35.9 mmHg.  3. Left atrial size was moderately dilated.  4. Shirley [name] small pericardial effusion is present. The pericardial effusion is circumferential.  5. The mitral valve is normal in structure. Mild to moderate mitral valve regurgitation.  6. Tricuspid valve regurgitation is mild to moderate.  7. The aortic valve is tricuspid. Aortic valve regurgitation is not visualized. No aortic stenosis is present. FINDINGS  Left Ventricle: Left ventricular ejection fraction, by estimation, is 45 to 50%. The left ventricle has mildly decreased function. The left ventricle demonstrates global hypokinesis. The left ventricular internal cavity size was normal in size. There is  mild concentric left ventricular hypertrophy. Indeterminate diastolic filling due to E-Jaleeah Slight fusion. Right Ventricle: The right ventricular size is normal. No increase in right ventricular wall thickness. Right ventricular systolic function is normal. There is mildly elevated pulmonary artery systolic pressure. The tricuspid regurgitant velocity is 2.87  m/s, and with an assumed right atrial pressure of 3 mmHg, the estimated right ventricular systolic pressure is 35.9 mmHg. Left Atrium: Left atrial size was moderately dilated. Right Atrium: Right  atrial size was normal in size. Pericardium: Shirley [name] small pericardial effusion is present. The pericardial effusion is circumferential. Mitral Valve: The mitral valve is normal in structure. Mild to moderate mitral valve regurgitation, with centrally-directed jet. Tricuspid Valve: The tricuspid valve is normal in structure. Tricuspid valve regurgitation is mild to moderate. Aortic Valve: The aortic valve is tricuspid. Aortic valve regurgitation is not visualized. No aortic stenosis is present. Pulmonic Valve: The pulmonic valve was grossly normal. Pulmonic valve regurgitation is not visualized. Aorta: The aortic root and ascending aorta are structurally normal, with no evidence of dilitation. IAS/Shunts: No atrial level shunt detected by color flow Doppler.  LEFT VENTRICLE PLAX 2D LVIDd:         5.20 cm LVIDs:         4.20 cm LV PW:         1.30 cm LV IVS:        1.30 cm LVOT diam:     2.10 cm LV SV:         65 LV SV Index:   29 LVOT Area:     3.46 cm  LV Volumes (MOD) LV vol d, MOD A2C: 142.0 ml LV vol d, MOD A4C: 112.0 ml LV vol s, MOD A2C: 84.5 ml LV vol s, MOD A4C: 61.3 ml LV SV MOD A2C:     57.5 ml LV SV MOD A4C:     112.0 ml LV SV MOD BP:      53.9 ml RIGHT VENTRICLE RV S prime:     15.10 cm/s LEFT ATRIUM             Index        RIGHT ATRIUM           Index LA diam:        4.60 cm 2.07 cm/m   RA Area:     14.80 cm LA Vol (A2C):   50.8 ml 22.85 ml/m  RA Volume:   38.30 ml  17.23  ml/m LA Vol (A4C):   70.5 ml 31.71 ml/m LA Biplane Vol: 63.9 ml 28.74 ml/m  AORTIC VALVE LVOT Vmax:   104.00 cm/s LVOT Vmean:  73.600 cm/s LVOT VTI:    0.188 m  AORTA Ao Root diam: 2.90 cm TRICUSPID VALVE TR Peak grad:   32.9 mmHg TR Vmax:        287.00 cm/s  SHUNTS Systemic VTI:  0.19 m Systemic Diam: 2.10 cm Shirley Hora Croitoru MD Electronically signed by Thurmon Fair MD Signature Date/Time: 03/21/2022/8:22:51 AM    Final    CT ABDOMEN PELVIS WO CONTRAST  Result Date: 03/21/2022 CLINICAL DATA:  46 year old female with history of  nonlocalized abdominal pain. EXAM: CT ABDOMEN AND PELVIS WITHOUT CONTRAST TECHNIQUE: Multidetector CT imaging of the abdomen and pelvis was performed following the standard protocol without IV contrast. RADIATION DOSE REDUCTION: This exam was performed according to the departmental dose-optimization program which includes automated exposure control, adjustment of the mA and/or kV according to patient size and/or use of iterative reconstruction technique. COMPARISON:  CT of the chest, abdomen and pelvis 04/16/2021. FINDINGS: Lower chest: Unremarkable. Hepatobiliary: Diffuse low attenuation throughout the hepatic parenchyma, indicative of Magdiel Bartles background of hepatic steatosis. No definite suspicious cystic or solid hepatic lesions are confidently identified on today's noncontrast CT examination. Unenhanced appearance of the gallbladder is unremarkable. Pancreas: No definite pancreatic mass or peripancreatic fluid collections or inflammatory changes are noted on today's noncontrast CT examination. Spleen: Unremarkable. Adrenals/Urinary Tract: There are no abnormal calcifications within the collecting system of either kidney, along the course of either ureter, or within the lumen of the urinary bladder. No hydroureteronephrosis or perinephric stranding to suggest urinary tract obstruction at this time. The unenhanced appearance of the kidneys is unremarkable bilaterally. Unenhanced appearance of the urinary bladder is normal. Bilateral adrenal glands are normal in appearance. Stomach/Bowel: Unenhanced appearance of the stomach is normal. There is no pathologic dilatation of small bowel or colon. Normal appendix. Vascular/Lymphatic: Atherosclerotic calcifications in the abdominal aorta. No lymphadenopathy noted in the abdomen or pelvis. Reproductive: Uterus and ovaries are unremarkable in appearance. Other: No significant volume of ascites.  No pneumoperitoneum. Musculoskeletal: There are no aggressive appearing lytic or  blastic lesions noted in the visualized portions of the skeleton. IMPRESSION: 1. No acute findings are noted in the abdomen or pelvis to account for the patient's symptoms. 2. Hepatic steatosis. 3. Aortic atherosclerosis. Electronically Signed   By: Trudie Reed M.D.   On: 03/21/2022 06:57   DG Chest Portable 1 View  Result Date: 03/20/2022 CLINICAL DATA:  Chest pain EXAM: PORTABLE CHEST 1 VIEW COMPARISON:  04/20/2021 FINDINGS: The heart size is mildly enlarged. Both lungs are clear. The visualized skeletal structures are unremarkable. IMPRESSION: Mild cardiomegaly. No acute cardiopulmonary findings. Electronically Signed   By: Duanne Guess D.O.   On: 03/20/2022 21:31        Scheduled Meds:  amLODipine  10 mg Oral Daily   aspirin  81 mg Oral Daily   atorvastatin  40 mg Oral Daily   carvedilol  6.25 mg Oral BID WC   Chlorhexidine Gluconate Cloth  6 each Topical Q0600   insulin aspart  0-9 Units Subcutaneous Q4H   ketorolac  1 drop Left Eye QID   metoCLOPramide (REGLAN) injection  10 mg Intravenous Q8H   mupirocin ointment  1 Application Nasal BID   pantoprazole (PROTONIX) IV  40 mg Intravenous Q24H   tobramycin  1 drop Left Eye Q6H   Continuous Infusions:  heparin 1,200 Units/hr (  03/22/22 1350)     LOS: 1 day    Time spent: over 30 min    Lacretia Nicks, MD [facility]   To contact the attending provider between 7A-7P or the covering provider during after hours 7P-7A, please log into the web site [email or web address] and access using universal [place] password for that web site. If you do not have the password, please call the hospital operator.  03/22/2022, 2:44 PM

## 2022-03-22 NOTE — Progress Notes (Addendum)
ANTICOAGULATION CONSULT NOTE - Follow Up Consult  Pharmacy Consult for heparin Indication: pulmonary embolus  Labs: Recent Labs    03/20/22 1847 03/20/22 2110 03/21/22 0600 03/21/22 1445 03/21/22 2345  HGB 12.5  --   --   --   --   HCT 37.1  --   --   --   --   PLT 435*  --   --   --   --   HEPARINUNFRC  --   --   --  0.85* 0.68  CREATININE 2.67*  --  2.46*  --   --   TROPONINIHS 207* 190*  --   --   --     Assessment/Plan:  46yo female therapeutic on heparin after rate change. Will continue infusion at current rate of 1350 units/hr and confirm stable with am labs.   Wynona Neat, PharmD, BCPS  03/22/2022,12:33 AM   Addendum: Heparin level this am above goal again; RN reports no infusion issues or signs of bleeding. Will decrease heparin gtt by 2 units/kg/hr to 1200 units/hr and check level in 8 hours.   VB 4:50 AM

## 2022-03-23 DIAGNOSIS — R079 Chest pain, unspecified: Secondary | ICD-10-CM | POA: Diagnosis not present

## 2022-03-23 LAB — CBC WITH DIFFERENTIAL/PLATELET
Abs Immature Granulocytes: 0.04 10*3/uL (ref 0.00–0.07)
Basophils Absolute: 0.1 10*3/uL (ref 0.0–0.1)
Basophils Relative: 0 %
Eosinophils Absolute: 0.2 10*3/uL (ref 0.0–0.5)
Eosinophils Relative: 2 %
HCT: 28.6 % — ABNORMAL LOW (ref 36.0–46.0)
Hemoglobin: 10 g/dL — ABNORMAL LOW (ref 12.0–15.0)
Immature Granulocytes: 0 %
Lymphocytes Relative: 30 %
Lymphs Abs: 3.4 10*3/uL (ref 0.7–4.0)
MCH: 27 pg (ref 26.0–34.0)
MCHC: 35 g/dL (ref 30.0–36.0)
MCV: 77.1 fL — ABNORMAL LOW (ref 80.0–100.0)
Monocytes Absolute: 1 10*3/uL (ref 0.1–1.0)
Monocytes Relative: 9 %
Neutro Abs: 6.6 10*3/uL (ref 1.7–7.7)
Neutrophils Relative %: 59 %
Platelets: 325 10*3/uL (ref 150–400)
RBC: 3.71 MIL/uL — ABNORMAL LOW (ref 3.87–5.11)
RDW: 15.4 % (ref 11.5–15.5)
WBC: 11.4 10*3/uL — ABNORMAL HIGH (ref 4.0–10.5)
nRBC: 0 % (ref 0.0–0.2)

## 2022-03-23 LAB — COMPREHENSIVE METABOLIC PANEL
ALT: 7 U/L (ref 0–44)
AST: 18 U/L (ref 15–41)
Albumin: 2 g/dL — ABNORMAL LOW (ref 3.5–5.0)
Alkaline Phosphatase: 52 U/L (ref 38–126)
Anion gap: 9 (ref 5–15)
BUN: 35 mg/dL — ABNORMAL HIGH (ref 6–20)
CO2: 19 mmol/L — ABNORMAL LOW (ref 22–32)
Calcium: 8.6 mg/dL — ABNORMAL LOW (ref 8.9–10.3)
Chloride: 112 mmol/L — ABNORMAL HIGH (ref 98–111)
Creatinine, Ser: 2.41 mg/dL — ABNORMAL HIGH (ref 0.44–1.00)
GFR, Estimated: 24 mL/min — ABNORMAL LOW (ref >60)
Glucose, Bld: 140 mg/dL — ABNORMAL HIGH (ref 70–99)
Potassium: 3.9 mmol/L (ref 3.5–5.1)
Sodium: 140 mmol/L (ref 135–145)
Total Bilirubin: 0.3 mg/dL (ref 0.3–1.2)
Total Protein: 5.4 g/dL — ABNORMAL LOW (ref 6.5–8.1)

## 2022-03-23 LAB — HEPARIN LEVEL (UNFRACTIONATED): Heparin Unfractionated: 0.51 IU/mL (ref 0.30–0.70)

## 2022-03-23 LAB — MAGNESIUM: Magnesium: 2.1 mg/dL (ref 1.7–2.4)

## 2022-03-23 LAB — GLUCOSE, CAPILLARY
Glucose-Capillary: 109 mg/dL — ABNORMAL HIGH (ref 70–99)
Glucose-Capillary: 126 mg/dL — ABNORMAL HIGH (ref 70–99)
Glucose-Capillary: 126 mg/dL — ABNORMAL HIGH (ref 70–99)
Glucose-Capillary: 132 mg/dL — ABNORMAL HIGH (ref 70–99)
Glucose-Capillary: 142 mg/dL — ABNORMAL HIGH (ref 70–99)
Glucose-Capillary: 89 mg/dL (ref 70–99)

## 2022-03-23 LAB — PHOSPHORUS: Phosphorus: 3.2 mg/dL (ref 2.5–4.6)

## 2022-03-23 MED ORDER — CARVEDILOL 12.5 MG PO TABS
12.5000 mg | ORAL_TABLET | Freq: Two times a day (BID) | ORAL | Status: DC
  Administered 2022-03-23 – 2022-03-24 (×2): 12.5 mg via ORAL
  Filled 2022-03-23 (×2): qty 1

## 2022-03-23 MED ORDER — INSULIN ASPART 100 UNIT/ML IJ SOLN
0.0000 [IU] | Freq: Three times a day (TID) | INTRAMUSCULAR | Status: DC
  Administered 2022-03-24: 1 [IU] via SUBCUTANEOUS

## 2022-03-23 NOTE — Progress Notes (Signed)
TRH night cross cover note:   I was contacted by RN requesting clarification regarding on-going need to q4h CBG monitoring / sliding scale insulin coverage.   Per my chart review, it appears that patient's blood sugars have been stable over the last 3 days, noting a range of 89 - 164 over that time, and without any hypoglycemic readings over that timeframe.  I subsequently changed her CBG monitoring/sliding scale insulin coverage from every 4 hours to before every meal and at bedtime, maintaining sensitive dosing of sliding scale insulin coverage.     Babs Bertin, DO Hospitalist

## 2022-03-23 NOTE — Plan of Care (Signed)

## 2022-03-23 NOTE — Progress Notes (Signed)
PROGRESS NOTE    [name]  VOH:[id number] DOB: [birth year] DOA: 03/20/2022 PCP: Bettey Costa, NP  Chief Complaint  Patient presents with   Chest Pain    Brief Narrative:  Shirley [name] is Shirley [name] 46 y.o. female with medical history significant of insulin-dependent type 2 diabetes, gastroparesis, recurrent abscesses, hypertension, medical noncompliance, CKD stage IIIb.,  OSA on CPAP, CAD status post PCI, CVA, anxiety, depression, fibromyalgia, chronic hep B, hyperlipidemia, substance abuse (cocaine and marijuana), GERD, obesity presented to the ED with chest pain, nausea, and vomiting.  Noted to be hypertensive with systolic in the 190s on arrival and tachycardic with heart rate 140-150.  Oxygen saturation 98-100% on room air.  She received aspirin 324 mg and nitroglycerin x 1 with EMS.  Labs showing WBC 15.5 (chronically elevated and improved), hemoglobin 12.5, platelet count 435k (chronically elevated and improved), creatinine 2.6 (was 2.4 in April 2023 and previously 1.6 in February 2023), LFTs normal, high-sensitivity troponin elevated but stable (207> 190), UDS pending, D-dimer 0.93.  Chest x-ray showing mild cardiomegaly and no acute findings. Patient was given Benadryl, Haldol, and 2 L fluid boluses.  Cardiology consulted.  TRH called to admit.   Patient is Shirley [name] poor historian.  Reports 3-day history of nausea and nonbloody emesis.  Endorsing epigastric and left lower quadrant abdominal pain.  Denies diarrhea.  Not endorsing any urinary symptoms.  She has not been able to tolerate p.o. intake.  She stopped taking her home blood pressure medications Aalayah Riles week ago.  She denies drug/cocaine use.  She is reporting constant sharp substernal chest pain for the past 3 days.  Reports history of DVT/PE in the past but no longer on anticoagulation.  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    Troponin elevation felt to be due to demand ischemia.   PE likely explains symptoms and elevated troponin -Echocardiogram as above -Dose of atorvastatin increased to 40 mg daily and started on aspirin 81 mg daily per cardiology recommendations. -UDS pending   Sinus Tachycardia Persistent today Will increase dose of coreg and follow Continue IVF for now as well Also contribution from PE as well. 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  Also has history of cocaine abuse. Continue amlodipine 10 mg daily Switched from metoprolol to carvedilol 12.5 mg twice daily per cardiology given concern for ongoing cocaine abuse Hold losartan with [facility]iology recommending IV hydralazine PRN UDS negative    AKI on CKD stage IIIb Likely secondary to dehydration. Creatinine 2.6 (was 2.4 in April 2023 and previously 1.6 in February 2023). Mildly improved again today, will  monitor No hydro on imaging Urinalysis pending Losartan on hold Follow with IVF given tachycardia   Poorly controlled insulin-dependent type 2 diabetes A1c 13.5 on 04/17/2021. -Sensitive sliding scale  insulin, she's on QID short acting at home, follow needs as her PO intake improves - holding mounjaro   OSA -Continue nightly CPAP   History of CVA -Started on aspirin and dose of statin increased -Not clear to me hx of this, will need to review at d/c to determine whether to continue aspirin     DVT prophylaxis: heparin gtt Code Status: full Family Communication: none, family asleep at bedside Disposition:   Status is: Inpatient Remains inpatient appropriate because: continued tachycardia   Consultants:  cardiology  Procedures:  Echo IMPRESSIONS     1. Left ventricular ejection fraction, by estimation, is 45 to 50%. The  left ventricle has mildly decreased function. The left ventricle  demonstrates global hypokinesis. There is mild concentric left ventricular  hypertrophy. Indeterminate diastolic  filling due to E-Ezreal Turay fusion.   2. Right ventricular systolic function is normal. The right ventricular  size is normal. There is mildly elevated pulmonary artery systolic  pressure. The estimated right ventricular systolic pressure is 35.9 mmHg.   3. Left atrial size was moderately dilated.   4. Maylea Soria small pericardial effusion is present. The pericardial effusion is  circumferential.   5. The mitral valve is normal in structure. Mild to moderate mitral valve  regurgitation.   6. Tricuspid valve regurgitation is mild to moderate.   7. The aortic valve is tricuspid. Aortic valve regurgitation is not  visualized. No aortic stenosis is present.   LE [place] pending  Antimicrobials:  Anti-infectives (From admission, onward)    None       Subjective: Spaghetti didn't sit well with her, otherwise doing ok  Objective: Vitals:   03/23/22 0428 03/23/22 0821 03/23/22 0824 03/23/22 0827  BP: (!) 167/103 (!) 162/106    Pulse: (!) 118 (!) 113    Resp:  18    Temp: 98.3 F (36.8 C) 98.9 F (37.2 C)    TempSrc: Oral Oral    SpO2: 93% 95% 96% 98%  Weight:      Height:         Intake/Output Summary (Last 24 hours) at 03/23/2022 1549 Last data filed at 03/23/2022 0500 Gross per 24 hour  Intake 2530.13 ml  Output 1550 ml  Net 980.13 ml   Filed Weights   03/21/22 0216 03/22/22 0100  Weight: 113.4 kg 88.1 kg    Examination:  General: No acute distress. Cardiovascular: RRR, tachycardic Lungs: unlabored Abdomen: Soft, nontender, nondistended Neurological: Alert and oriented 3. Moves all extremities 4 with equal strength. Cranial nerves II through XII grossly intact. Extremities: No clubbing or cyanosis. No edema.   Data Reviewed: I have personally reviewed following labs and imaging studies  CBC: Recent Labs  Lab 03/20/22 1847 03/22/22 0329 03/23/22 0337  WBC 15.5* 12.7* 11.4*  NEUTROABS 11.4*  --  6.6  HGB 12.5 11.4* 10.0*  HCT 37.1 31.7* 28.6*  MCV 77.5* 75.3* 77.1*  PLT 435* 368 325    Basic Metabolic Panel: Recent Labs  Lab 03/20/22 1847 03/21/22 0600 03/23/22 0337  NA 143 141 140  K 3.6 3.2* 3.9  CL 107 111 112*  CO2 22 21* 19*  GLUCOSE 147* 126* 140*  BUN 41* 40* 35*  CREATININE 2.67* 2.46* 2.41*  CALCIUM 9.2 8.1* 8.6*  MG  --  1.8 2.1  PHOS  --   --  3.2    GFR: Estimated Creatinine Clearance: 32.5 mL/min (Rabecca Birge) (by C-G formula based on SCr of 2.41 mg/dL (H)).  Liver Function Tests: Recent Labs  Lab 03/20/22 1847 03/23/22 0337  AST 36 18  ALT 12 7  ALKPHOS 66 52  BILITOT 0.8 0.3  PROT 6.7 5.4*  ALBUMIN 2.5* 2.0*    CBG: Recent Labs  Lab 03/22/22 2041 03/23/22 0000 03/23/22 0427 03/23/22 0749 03/23/22 1151  GLUCAP 164* 132* 142* 126* 126*     Recent Results (from the past 240 hour(s))  Urine Culture     Status: Abnormal   Collection Time: 03/21/22  4:09 AM   Specimen: In/Out Cath Urine  Result Value Ref Range Status   Specimen Description IN/OUT CATH URINE  Final   Special Requests Normal  Final   Culture (Tangelia Sanson)  Final    >=100,000 COLONIES/mL GARDNERELLA VAGINALIS Standardized susceptibility  testing for this organism is not available. Performed at [facility], [address], [place], [place] [address]    Report Status 03/22/2022 FINAL  Final  MRSA Next Gen by PCR, Nasal     Status: Abnormal   Collection Time: 03/22/22  1:37 AM   Specimen: Nasal Mucosa; Nasal Swab  Result Value Ref Range Status   MRSA by PCR Next Gen DETECTED (Axxel Gude) NOT DETECTED Final    Comment: RESULT CALLED TO, READ BACK BY AND VERIFIED WITH: JAMIS,RN@0320  03/22/22 MK (NOTE) The GeneXpert MRSA Assay (FDA approved for NASAL specimens only), is one component of [facility] comprehensive MRSA colonization surveillance program. It is not intended to diagnose MRSA infection nor to guide or monitor treatment for MRSA infections. Test performance is not FDA approved in patients less than 5 years old. Performed at [facility], [address], [place], [place] [address]          Radiology Studies: VAS [place] LOWER EXTREMITY VENOUS (DVT)  Result Date: 03/22/2022  Lower Venous DVT Study Patient Name:  STORMEE MILLIREN Lanphier  Date of Exam:   03/22/2022 Medical Rec #: [id number]         Accession #:    [id number] Date of Birth: [birth year]         Patient Gender: F Patient Age:   42 years Exam Location:  [facility] Procedure:      VAS [place] LOWER EXTREMITY VENOUS (DVT) Referring Phys: Roderic Lammert POWELL JR --------------------------------------------------------------------------------  Indications: Pulmonary embolism. Other Indications: History of PE/DVT. Comparison Study: 11-26-2019 Prior bilateral lower extremity venous study was                   negative for DVT. Performing Technologist: Jean Rosenthal RDMS, RVT  Examination Guidelines: Justine Dines complete evaluation includes B-mode imaging, spectral Doppler, color Doppler, and power Doppler as needed of all accessible portions of each vessel. Bilateral testing is considered an integral part of Bader Stubblefield complete examination. Limited examinations for reoccurring indications may be performed as  noted. The reflux portion of the exam is performed with the patient in reverse Trendelenburg.  +---------+---------------+---------+-----------+--------------+--------------+ RIGHT    CompressibilityPhasicitySpontaneityProperties    Thrombus Aging +---------+---------------+---------+-----------+--------------+--------------+ CFV      Full           Yes      Yes                                     +---------+---------------+---------+-----------+--------------+--------------+ SFJ      Full                                                            +---------+---------------+---------+-----------+--------------+--------------+  FV Prox  Full                                                            +---------+---------------+---------+-----------+--------------+--------------+ FV Mid   Full                                                            +---------+---------------+---------+-----------+--------------+--------------+ FV DistalFull                                                            +---------+---------------+---------+-----------+--------------+--------------+ PFV      Full                                                            +---------+---------------+---------+-----------+--------------+--------------+ POP      Partial        Yes      Yes        Mild strandingChronic        +---------+---------------+---------+-----------+--------------+--------------+ PTV      Full                                                            +---------+---------------+---------+-----------+--------------+--------------+ PERO     Full                                                            +---------+---------------+---------+-----------+--------------+--------------+ Gastroc  Full                                                            +---------+---------------+---------+-----------+--------------+--------------+    +---------+---------------+---------+-----------+----------+--------------+ LEFT     CompressibilityPhasicitySpontaneityPropertiesThrombus Aging +---------+---------------+---------+-----------+----------+--------------+ CFV      Full           Yes      Yes                                 +---------+---------------+---------+-----------+----------+--------------+ SFJ      Full                                                        +---------+---------------+---------+-----------+----------+--------------+  FV Prox  Full                                                        +---------+---------------+---------+-----------+----------+--------------+ FV Mid   Full                                                        +---------+---------------+---------+-----------+----------+--------------+ FV DistalFull                                                        +---------+---------------+---------+-----------+----------+--------------+ PFV      Full                                                        +---------+---------------+---------+-----------+----------+--------------+ POP      Full           Yes      Yes                                 +---------+---------------+---------+-----------+----------+--------------+ PTV      Full                                                        +---------+---------------+---------+-----------+----------+--------------+ PERO     Full                                                        +---------+---------------+---------+-----------+----------+--------------+ Gastroc  Full                                                        +---------+---------------+---------+-----------+----------+--------------+     Summary: RIGHT: - Findings consistent with 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  - No cystic structure found in the popliteal fossa.  LEFT: - There is no evidence of  deep vein thrombosis in the lower extremity.  - No cystic structure found in the popliteal fossa.  *See table(s) above for measurements and observations. Electronically signed by Coral Else MD on 03/22/2022 at 7:40:54 PM.    Final         Scheduled Meds:  amLODipine  10 mg Oral Daily   aspirin  81 mg Oral Daily   atorvastatin  40 mg Oral Daily   carvedilol  12.5 mg  Oral BID WC   Chlorhexidine Gluconate Cloth  6 each Topical Q0600   dicyclomine  10 mg Oral TID AC & HS   famotidine  20 mg Oral QPM   insulin aspart  0-9 Units Subcutaneous Q4H   ketorolac  1 drop Left Eye QID   metoCLOPramide (REGLAN) injection  10 mg Intravenous Q8H   mupirocin ointment  1 Application Nasal BID   pantoprazole (PROTONIX) IV  40 mg Intravenous Q24H   tobramycin  1 drop Left Eye Q6H   Continuous Infusions:  heparin 1,200 Units/hr (03/23/22 1057)   lactated ringers 125 mL/hr at 03/23/22 0953     LOS: 2 days    Time spent: over 30 min    Lacretia Nicks, MD [facility]   To contact the attending provider between 7A-7P or the covering provider during after hours 7P-7A, please log into the web site [email or web address] and access using universal [place] password for that web site. If you do not have the password, please call the hospital operator.  03/23/2022, 3:49 PM

## 2022-03-23 NOTE — Progress Notes (Signed)
Patient refuses CPAP for the night  

## 2022-03-23 NOTE — Plan of Care (Signed)
Problem: Education: Goal: Ability to describe self-care measures that may prevent or decrease complications (Diabetes Survival Skills Education) will improve 03/23/2022 1634 by Drucie Ip I, RN Outcome: Progressing 03/23/2022 1633 by Drucie Ip I, RN Outcome: Progressing Goal: Individualized Educational Video(s) 03/23/2022 1634 by Drucie Ip I, RN Outcome: Progressing 03/23/2022 1633 by Drucie Ip I, RN Outcome: Progressing   Problem: Coping: Goal: Ability to adjust to condition or change in health will improve 03/23/2022 1634 by Drucie Ip I, RN Outcome: Progressing 03/23/2022 1633 by Drucie Ip I, RN Outcome: Progressing   Problem: Fluid Volume: Goal: Ability to maintain a balanced intake and output will improve 03/23/2022 1634 by Drucie Ip I, RN Outcome: Progressing 03/23/2022 1633 by Drucie Ip I, RN Outcome: Progressing   Problem: Health Behavior/Discharge Planning: Goal: Ability to identify and utilize available resources and services will improve 03/23/2022 1634 by Drucie Ip I, RN Outcome: Progressing 03/23/2022 1633 by Drucie Ip I, RN Outcome: Progressing Goal: Ability to manage health-related needs will improve 03/23/2022 1634 by Drucie Ip I, RN Outcome: Progressing 03/23/2022 1633 by Drucie Ip I, RN Outcome: Progressing   Problem: Metabolic: Goal: Ability to maintain appropriate glucose levels will improve 03/23/2022 1634 by Drucie Ip I, RN Outcome: Progressing 03/23/2022 1633 by Drucie Ip I, RN Outcome: Progressing   Problem: Nutritional: Goal: Maintenance of adequate nutrition will improve 03/23/2022 1634 by Drucie Ip I, RN Outcome: Progressing 03/23/2022 1633 by Drucie Ip I, RN Outcome: Progressing Goal: Progress toward achieving an optimal weight will improve 03/23/2022 1634 by Drucie Ip I, RN Outcome:  Progressing 03/23/2022 1633 by Drucie Ip I, RN Outcome: Progressing   Problem: Skin Integrity: Goal: Risk for impaired skin integrity will decrease 03/23/2022 1634 by Drucie Ip I, RN Outcome: Progressing 03/23/2022 1633 by Drucie Ip I, RN Outcome: Progressing   Problem: Tissue Perfusion: Goal: Adequacy of tissue perfusion will improve 03/23/2022 1634 by Drucie Ip I, RN Outcome: Progressing 03/23/2022 1633 by Drucie Ip I, RN Outcome: Progressing   Problem: Education: Goal: Knowledge of General Education information will improve Description: Including pain rating scale, medication(s)/side effects and non-pharmacologic comfort measures 03/23/2022 1634 by Drucie Ip I, RN Outcome: Progressing 03/23/2022 1633 by Drucie Ip I, RN Outcome: Progressing   Problem: Health Behavior/Discharge Planning: Goal: Ability to manage health-related needs will improve 03/23/2022 1634 by Drucie Ip I, RN Outcome: Progressing 03/23/2022 1633 by Drucie Ip I, RN Outcome: Progressing   Problem: Clinical Measurements: Goal: Ability to maintain clinical measurements within normal limits will improve 03/23/2022 1634 by Drucie Ip I, RN Outcome: Progressing 03/23/2022 1633 by Drucie Ip I, RN Outcome: Progressing Goal: Will remain free from infection 03/23/2022 1634 by Drucie Ip I, RN Outcome: Progressing 03/23/2022 1633 by Drucie Ip I, RN Outcome: Progressing Goal: Diagnostic test results will improve 03/23/2022 1634 by Drucie Ip I, RN Outcome: Progressing 03/23/2022 1633 by Drucie Ip I, RN Outcome: Progressing Goal: Respiratory complications will improve 03/23/2022 1634 by Drucie Ip I, RN Outcome: Progressing 03/23/2022 1633 by Drucie Ip I, RN Outcome: Progressing Goal: Cardiovascular complication will be avoided 03/23/2022 1634 by Drucie Ip I,  RN Outcome: Progressing 03/23/2022 1633 by Drucie Ip I, RN Outcome: Progressing   Problem: Activity: Goal: Risk for activity intolerance will decrease 03/23/2022 1634 by Drucie Ip I, RN Outcome: Progressing 03/23/2022 1633 by Drucie Ip I, RN Outcome: Progressing   Problem: Nutrition: Goal: Adequate nutrition will be maintained 03/23/2022 1634 by Drucie Ip I, RN Outcome: Progressing 03/23/2022 1633 by Drucie Ip I, RN Outcome: Progressing  Problem: Coping: Goal: Level of anxiety will decrease 03/23/2022 1634 by Drucie Ip I, RN Outcome: Progressing 03/23/2022 1633 by Drucie Ip I, RN Outcome: Progressing   Problem: Elimination: Goal: Will not experience complications related to bowel motility 03/23/2022 1634 by Drucie Ip I, RN Outcome: Progressing 03/23/2022 1633 by Drucie Ip I, RN Outcome: Progressing Goal: Will not experience complications related to urinary retention 03/23/2022 1634 by Drucie Ip I, RN Outcome: Progressing 03/23/2022 1633 by Drucie Ip I, RN Outcome: Progressing   Problem: Pain Managment: Goal: General experience of comfort will improve 03/23/2022 1634 by Drucie Ip I, RN Outcome: Progressing 03/23/2022 1633 by Drucie Ip I, RN Outcome: Progressing   Problem: Safety: Goal: Ability to remain free from injury will improve 03/23/2022 1634 by Drucie Ip I, RN Outcome: Progressing 03/23/2022 1633 by Drucie Ip I, RN Outcome: Progressing   Problem: Skin Integrity: Goal: Risk for impaired skin integrity will decrease 03/23/2022 1634 by Drucie Ip I, RN Outcome: Progressing 03/23/2022 1633 by Drucie Ip I, RN Outcome: Progressing

## 2022-03-23 NOTE — Progress Notes (Signed)
ANTICOAGULATION CONSULT NOTE  Pharmacy Consult for heparin Indication: high probability PE on VQ scan  Allergies  Allergen Reactions   Eggs Or Egg-Derived Products Hives, Itching and Nausea And Vomiting   Lisinopril Nausea Only and Swelling   Morphine And Related Itching and Swelling    Patient Measurements: Height: '5\' 7"'$  (170.2 cm) Weight: 113.4 kg (250 lb)  IBW/kg (Calculated) : 61.6 Heparin Dosing Weight: 90kg  Vital Signs: Temp: 98.9 F (37.2 C) (11/30 0821) Temp Source: Oral (11/30 0821) BP: 162/106 (11/30 0821) Pulse Rate: 113 (11/30 0821)  Labs: Recent Labs    03/20/22 1847 03/20/22 2110 03/21/22 0600 03/21/22 1445 03/22/22 0329 03/22/22 1209 03/23/22 0337  HGB 12.5  --   --   --  11.4*  --  10.0*  HCT 37.1  --   --   --  31.7*  --  28.6*  PLT 435*  --   --   --  368  --  325  HEPARINUNFRC  --   --   --    < > 0.79* 0.64 0.51  CREATININE 2.67*  --  2.46*  --   --   --  2.41*  TROPONINIHS 207* 190*  --   --   --   --   --    < > = values in this interval not displayed.     Estimated Creatinine Clearance: 32.5 mL/min (A) (by C-G formula based on SCr of 2.41 mg/dL (H)).   Medical History: Past Medical History:  Diagnosis Date   Abscess of tunica vaginalis    10/09- Abundant S. aureus- sensitive to all abx   Anxiety    Blood dyscrasia    CAD (coronary artery disease) 06/15/2006   s/p Subendocardial MI with PDA angioplasty(no stent) on 06/15/06 and relook  cath 06/19/06 showed patency of site. Cath 12/10- no restenosis or significant CAD progression   CVA (cerebral vascular accident) ([place]) ~ 02/2014   denies residual on 04/22/2014   CVA (cerebral vascular accident) [facility])    history of remote right cerebellar infarct noted on head CT at least since 10/2011   Depression    Diabetes mellitus type 2, uncontrolled, with complications    Fibromyalgia    Gastritis    Gastroparesis    secondary to poorly controlled DM, last emptying study performed 01/2010   was normal but may be falsely positive as pt was on reglan   GERD (gastroesophageal reflux disease)    Hepatitis B, chronic (HCC)    Hep BeAb+,Hep B cAb+ & Hep BsAg+ (9/06)   History of pyelonephritis    H/o GrpB Pyelonephritis (9/06) and UTI- 07/11- E.Coli, 12/10- GBS   Hyperlipidemia    Hypertension    Iron deficiency anemia    Irregular menses    Small ovarian follicles seen on ZM(6/29)   MI (myocardial infarction) ([place]) 05/2006   PDA percutaneous transluminal coronary angioplasty   Migraine    "weekly" (04/22/2014)   N&V (nausea and vomiting)    Chronic. Unclear etiology with multiple admission and ED visits. CT abdomen with and without contrast (02/2011)  showed no acute process. Gastic Emptying scan (01/2010) was normal. Ultrasound of the abdomen was within normal limits. Hepatitis B viral load was undectable. HIV NR. EGD - gastritis, Hpylori + s/p Rx   Obesity    OSA (obstructive sleep apnea)    "suppose to wear mask but I don't" (04/22/2014)   Peripheral neuropathy    Pneumonia    "this is probably the  2nd or 3rd time I've had pneumonia" (04/22/2014)   Recurrent boils    Seasonal asthma    Substance abuse (HCC)    Thrombocytosis    Hem/Onc suggested 2/2 chronic hepatits and/or iron deficiency anemia    Assessment: 46yo femalen on heparin for PE - high probability on VQ scan. [place] with chronic RLE DVT   Heparin level 0.51 (therapeutic) on infusion at 1200 units/hr.   Goal of Therapy:  Heparin level 0.3-0.7 units/ml Monitor platelets by anticoagulation protocol: Yes   Plan:  Continue heparin infusiuon at 1200 units/hr Daily heparin level and CBC Will follow plans for oral anticoagulation  Hildred Laser, PharmD Clinical Pharmacist **Pharmacist phone directory can now be found on [email or web address] ([email or web address]).  Listed under [place].

## 2022-03-24 ENCOUNTER — Other Ambulatory Visit (HOSPITAL_COMMUNITY): Payer: Self-pay

## 2022-03-24 LAB — HEPARIN LEVEL (UNFRACTIONATED): Heparin Unfractionated: 0.46 IU/mL (ref 0.30–0.70)

## 2022-03-24 LAB — COMPREHENSIVE METABOLIC PANEL
ALT: 43 U/L (ref 0–44)
AST: 97 U/L — ABNORMAL HIGH (ref 15–41)
Albumin: 2 g/dL — ABNORMAL LOW (ref 3.5–5.0)
Alkaline Phosphatase: 153 U/L — ABNORMAL HIGH (ref 38–126)
Anion gap: 3 — ABNORMAL LOW (ref 5–15)
BUN: 31 mg/dL — ABNORMAL HIGH (ref 6–20)
CO2: 20 mmol/L — ABNORMAL LOW (ref 22–32)
Calcium: 8.1 mg/dL — ABNORMAL LOW (ref 8.9–10.3)
Chloride: 114 mmol/L — ABNORMAL HIGH (ref 98–111)
Creatinine, Ser: 2.23 mg/dL — ABNORMAL HIGH (ref 0.44–1.00)
GFR, Estimated: 27 mL/min — ABNORMAL LOW (ref >60)
Glucose, Bld: 131 mg/dL — ABNORMAL HIGH (ref 70–99)
Potassium: 4.1 mmol/L (ref 3.5–5.1)
Sodium: 137 mmol/L (ref 135–145)
Total Bilirubin: 0.3 mg/dL (ref 0.3–1.2)
Total Protein: 5.5 g/dL — ABNORMAL LOW (ref 6.5–8.1)

## 2022-03-24 LAB — CBC WITH DIFFERENTIAL/PLATELET
Abs Immature Granulocytes: 0.03 10*3/uL (ref 0.00–0.07)
Basophils Absolute: 0 10*3/uL (ref 0.0–0.1)
Basophils Relative: 0 %
Eosinophils Absolute: 0.3 10*3/uL (ref 0.0–0.5)
Eosinophils Relative: 3 %
HCT: 28.6 % — ABNORMAL LOW (ref 36.0–46.0)
Hemoglobin: 10 g/dL — ABNORMAL LOW (ref 12.0–15.0)
Immature Granulocytes: 0 %
Lymphocytes Relative: 26 %
Lymphs Abs: 2.7 10*3/uL (ref 0.7–4.0)
MCH: 26.7 pg (ref 26.0–34.0)
MCHC: 35 g/dL (ref 30.0–36.0)
MCV: 76.3 fL — ABNORMAL LOW (ref 80.0–100.0)
Monocytes Absolute: 0.9 10*3/uL (ref 0.1–1.0)
Monocytes Relative: 9 %
Neutro Abs: 6.2 10*3/uL (ref 1.7–7.7)
Neutrophils Relative %: 62 %
Platelets: 297 10*3/uL (ref 150–400)
RBC: 3.75 MIL/uL — ABNORMAL LOW (ref 3.87–5.11)
RDW: 15.4 % (ref 11.5–15.5)
WBC: 10.2 10*3/uL (ref 4.0–10.5)
nRBC: 0 % (ref 0.0–0.2)

## 2022-03-24 LAB — GLUCOSE, CAPILLARY
Glucose-Capillary: 117 mg/dL — ABNORMAL HIGH (ref 70–99)
Glucose-Capillary: 130 mg/dL — ABNORMAL HIGH (ref 70–99)
Glucose-Capillary: 140 mg/dL — ABNORMAL HIGH (ref 70–99)
Glucose-Capillary: 96 mg/dL (ref 70–99)

## 2022-03-24 LAB — PHOSPHORUS: Phosphorus: 3.3 mg/dL (ref 2.5–4.6)

## 2022-03-24 LAB — MAGNESIUM: Magnesium: 1.9 mg/dL (ref 1.7–2.4)

## 2022-03-24 MED ORDER — LACTATED RINGERS IV BOLUS
500.0000 mL | Freq: Once | INTRAVENOUS | Status: AC
  Administered 2022-03-24: 500 mL via INTRAVENOUS

## 2022-03-24 MED ORDER — CARVEDILOL 12.5 MG PO TABS
12.5000 mg | ORAL_TABLET | Freq: Once | ORAL | Status: AC
  Administered 2022-03-24: 12.5 mg via ORAL
  Filled 2022-03-24: qty 1

## 2022-03-24 MED ORDER — ATORVASTATIN CALCIUM 40 MG PO TABS
40.0000 mg | ORAL_TABLET | Freq: Every day | ORAL | 1 refills | Status: DC
  Filled 2022-03-24: qty 30, 30d supply, fill #0

## 2022-03-24 MED ORDER — ASPIRIN 81 MG PO CHEW
81.0000 mg | CHEWABLE_TABLET | Freq: Every day | ORAL | 1 refills | Status: AC
  Filled 2022-03-24: qty 30, 30d supply, fill #0

## 2022-03-24 MED ORDER — CARVEDILOL 25 MG PO TABS
25.0000 mg | ORAL_TABLET | Freq: Two times a day (BID) | ORAL | 1 refills | Status: DC
  Filled 2022-03-24: qty 60, 30d supply, fill #0

## 2022-03-24 MED ORDER — APIXABAN (ELIQUIS) VTE STARTER PACK (10MG AND 5MG)
ORAL_TABLET | ORAL | 0 refills | Status: DC
  Filled 2022-03-24: qty 74, 30d supply, fill #0

## 2022-03-24 MED ORDER — APIXABAN 5 MG PO TABS: 5.0000 mg | ORAL_TABLET | Freq: Two times a day (BID) | ORAL | Status: DC

## 2022-03-24 MED ORDER — METOCLOPRAMIDE HCL 5 MG PO TABS
5.0000 mg | ORAL_TABLET | Freq: Three times a day (TID) | ORAL | 0 refills | Status: DC
  Filled 2022-03-24: qty 42, 14d supply, fill #0

## 2022-03-24 MED ORDER — APIXABAN 5 MG PO TABS
5.0000 mg | ORAL_TABLET | Freq: Two times a day (BID) | ORAL | 1 refills | Status: DC
  Filled 2022-03-24: qty 60, 30d supply, fill #0

## 2022-03-24 MED ORDER — CARVEDILOL 25 MG PO TABS: 25.0000 mg | ORAL_TABLET | Freq: Two times a day (BID) | ORAL | Status: DC

## 2022-03-24 MED ORDER — HYDRALAZINE HCL 10 MG PO TABS: 10.0000 mg | ORAL_TABLET | Freq: Three times a day (TID) | ORAL | Status: DC

## 2022-03-24 MED ORDER — HYDRALAZINE HCL 10 MG PO TABS
10.0000 mg | ORAL_TABLET | Freq: Three times a day (TID) | ORAL | 0 refills | Status: DC
  Filled 2022-03-24: qty 90, 30d supply, fill #0

## 2022-03-24 MED ORDER — APIXABAN 5 MG PO TABS
10.0000 mg | ORAL_TABLET | Freq: Two times a day (BID) | ORAL | Status: DC
  Administered 2022-03-24: 10 mg via ORAL
  Filled 2022-03-24: qty 2

## 2022-03-24 NOTE — Discharge Instructions (Signed)
Information on my medicine - ELIQUIS (apixaban)  Why was Eliquis prescribed for you? 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  What do You need to know about Eliquis ? The starting dose is 10 mg (two 5 mg tablets) taken TWICE daily for the FIRST SEVEN (7) DAYS, then  the dose is reduced to ONE 5 mg tablet taken TWICE daily.  Eliquis may be taken with or without food.   Try to take the dose about the same time in the morning and in the evening. If you have difficulty swallowing the tablet whole please discuss with your pharmacist how to take the medication safely.  Take Eliquis exactly as prescribed and DO NOT stop taking Eliquis without talking to the doctor who prescribed the medication.  Stopping may increase your risk of developing a new blood clot.  Refill your prescription before you run out.  After discharge, you should have regular check-up appointments with your healthcare provider that is prescribing your Eliquis.    What do you do if you miss a dose? If a dose of ELIQUIS is not taken at the scheduled time, take it as soon as possible on the same day and twice-daily administration should be resumed. The dose should not be doubled to make up for a missed dose.  Important Safety Information A possible side effect of Eliquis is bleeding. You should call your healthcare provider right away if you experience any of the following: Bleeding from an injury or your nose that does not stop. Unusual colored urine (red or dark brown) or unusual colored stools (red or black). Unusual bruising for unknown reasons. A serious fall or if you hit your head (even if there is no bleeding).  Some medicines may interact with Eliquis and might increase your risk of bleeding or clotting while on Eliquis. To help avoid this, consult  your healthcare provider or pharmacist prior to using any new prescription or non-prescription medications, including herbals, vitamins, non-steroidal anti-inflammatory drugs (NSAIDs) and supplements.  This website has more information on Eliquis (apixaban): http://[email or web address].com/eliquis/home  

## 2022-03-24 NOTE — Plan of Care (Signed)

## 2022-03-24 NOTE — Discharge Summary (Signed)
**Note De-Identified vi Obfusction** [id number] Dte of Birth: 1975-09-18         Ptient Gender: F Ptient ge:   34 yers Exm Loction:  [facility] Procedure:      VS Kore LOWER EXTREMITY VENOUS (DVT) Referring Phys:  POWELL JR --------------------------------------------------------------------------------  Indictions: Pulmonry embolism. Other Indictions: History of PE/DVT. Comprison Study: 11-26-2019 Prior bilterl lower extremity venous study ws                   negtive for DVT. Performing Technologist: Drlin Coco RDMS, RVT  Exmintion Guidelines:  complete evlution includes B-mode imging, spectrl Doppler, color Doppler, nd power Doppler s needed of ll ccessible portions of ech vessel. Bilterl testing is considered n integrl prt of  complete exmintion. Limited exmintions for reoccurring indictions my be performed s noted. The reflux portion of the exm is performed with the ptient in reverse Trendelenburg.  +---------+---------------+---------+-----------+--------------+--------------+ RIGHT    CompressibilityPhsicitySpontneityProperties    Thrombus ging +---------+---------------+---------+-----------+--------------+--------------+ CFV      Full           Yes      Yes                                     +---------+---------------+---------+-----------+--------------+--------------+ SFJ      Full                                                            +---------+---------------+---------+-----------+--------------+--------------+ FV Prox  Full                                                            +---------+---------------+---------+-----------+--------------+--------------+ FV Mid   Full                                                             +---------+---------------+---------+-----------+--------------+--------------+ FV DistlFull                                                            +---------+---------------+---------+-----------+--------------+--------------+ PFV      Full                                                            +---------+---------------+---------+-----------+--------------+--------------+ POP      Prtil        Yes      Yes        Mild strndingChronic        +---------+---------------+---------+-----------+--------------+--------------+ PTV      Full                                                            +---------+---------------+---------+-----------+--------------+--------------+ **Note De-Identified vi Obfusction** [id number] Dte of Birth: 1975-09-18         Ptient Gender: F Ptient ge:   34 yers Exm Loction:  [facility] Procedure:      VS Kore LOWER EXTREMITY VENOUS (DVT) Referring Phys:  POWELL JR --------------------------------------------------------------------------------  Indictions: Pulmonry embolism. Other Indictions: History of PE/DVT. Comprison Study: 11-26-2019 Prior bilterl lower extremity venous study ws                   negtive for DVT. Performing Technologist: Drlin Coco RDMS, RVT  Exmintion Guidelines:  complete evlution includes B-mode imging, spectrl Doppler, color Doppler, nd power Doppler s needed of ll ccessible portions of ech vessel. Bilterl testing is considered n integrl prt of  complete exmintion. Limited exmintions for reoccurring indictions my be performed s noted. The reflux portion of the exm is performed with the ptient in reverse Trendelenburg.  +---------+---------------+---------+-----------+--------------+--------------+ RIGHT    CompressibilityPhsicitySpontneityProperties    Thrombus ging +---------+---------------+---------+-----------+--------------+--------------+ CFV      Full           Yes      Yes                                     +---------+---------------+---------+-----------+--------------+--------------+ SFJ      Full                                                            +---------+---------------+---------+-----------+--------------+--------------+ FV Prox  Full                                                            +---------+---------------+---------+-----------+--------------+--------------+ FV Mid   Full                                                             +---------+---------------+---------+-----------+--------------+--------------+ FV DistlFull                                                            +---------+---------------+---------+-----------+--------------+--------------+ PFV      Full                                                            +---------+---------------+---------+-----------+--------------+--------------+ POP      Prtil        Yes      Yes        Mild strndingChronic        +---------+---------------+---------+-----------+--------------+--------------+ PTV      Full                                                            +---------+---------------+---------+-----------+--------------+--------------+ **Note De-Identified vi Obfusction** [id number] Dte of Birth: [birth year]         Ptient Gender: F Ptient ge:   35 yers Exm Loction:  [facility] Procedure:      VS Kore LOWER EXTREMITY VENOUS (DVT) Referring Phys:  POWELL JR --------------------------------------------------------------------------------  Indictions: Pulmonry embolism. Other Indictions: History of PE/DVT. Comprison Study: 11-26-2019 Prior bilterl lower extremity venous study ws                   negtive for DVT. Performing Technologist: Drlin Coco RDMS, RVT  Exmintion Guidelines:  complete evlution includes B-mode imging, spectrl Doppler, color Doppler, nd power Doppler s needed of ll ccessible portions of ech vessel. Bilterl testing is considered n integrl prt of  complete exmintion. Limited exmintions for reoccurring indictions my be performed s noted. The reflux portion of the exm is performed with the ptient in reverse Trendelenburg.  +---------+---------------+---------+-----------+--------------+--------------+ RIGHT    CompressibilityPhsicitySpontneityProperties    Thrombus ging +---------+---------------+---------+-----------+--------------+--------------+ CFV      Full           Yes      Yes                                     +---------+---------------+---------+-----------+--------------+--------------+ SFJ      Full                                                            +---------+---------------+---------+-----------+--------------+--------------+ FV Prox  Full                                                            +---------+---------------+---------+-----------+--------------+--------------+ FV Mid   Full                                                             +---------+---------------+---------+-----------+--------------+--------------+ FV DistlFull                                                            +---------+---------------+---------+-----------+--------------+--------------+ PFV      Full                                                            +---------+---------------+---------+-----------+--------------+--------------+ POP      Prtil        Yes      Yes        Mild strndingChronic        +---------+---------------+---------+-----------+--------------+--------------+ PTV      Full                                                            +---------+---------------+---------+-----------+--------------+--------------+ **Note De-Identified vi Obfusction** [id number] Dte of Birth: 1975-09-18         Ptient Gender: F Ptient ge:   34 yers Exm Loction:  [facility] Procedure:      VS Kore LOWER EXTREMITY VENOUS (DVT) Referring Phys:  POWELL JR --------------------------------------------------------------------------------  Indictions: Pulmonry embolism. Other Indictions: History of PE/DVT. Comprison Study: 11-26-2019 Prior bilterl lower extremity venous study ws                   negtive for DVT. Performing Technologist: Drlin Coco RDMS, RVT  Exmintion Guidelines:  complete evlution includes B-mode imging, spectrl Doppler, color Doppler, nd power Doppler s needed of ll ccessible portions of ech vessel. Bilterl testing is considered n integrl prt of  complete exmintion. Limited exmintions for reoccurring indictions my be performed s noted. The reflux portion of the exm is performed with the ptient in reverse Trendelenburg.  +---------+---------------+---------+-----------+--------------+--------------+ RIGHT    CompressibilityPhsicitySpontneityProperties    Thrombus ging +---------+---------------+---------+-----------+--------------+--------------+ CFV      Full           Yes      Yes                                     +---------+---------------+---------+-----------+--------------+--------------+ SFJ      Full                                                            +---------+---------------+---------+-----------+--------------+--------------+ FV Prox  Full                                                            +---------+---------------+---------+-----------+--------------+--------------+ FV Mid   Full                                                             +---------+---------------+---------+-----------+--------------+--------------+ FV DistlFull                                                            +---------+---------------+---------+-----------+--------------+--------------+ PFV      Full                                                            +---------+---------------+---------+-----------+--------------+--------------+ POP      Prtil        Yes      Yes        Mild strndingChronic        +---------+---------------+---------+-----------+--------------+--------------+ PTV      Full                                                            +---------+---------------+---------+-----------+--------------+--------------+ **Note De-Identified vi Obfusction** [id number] Dte of Birth: 1975-09-18         Ptient Gender: F Ptient ge:   34 yers Exm Loction:  [facility] Procedure:      VS Kore LOWER EXTREMITY VENOUS (DVT) Referring Phys:  POWELL JR --------------------------------------------------------------------------------  Indictions: Pulmonry embolism. Other Indictions: History of PE/DVT. Comprison Study: 11-26-2019 Prior bilterl lower extremity venous study ws                   negtive for DVT. Performing Technologist: Drlin Coco RDMS, RVT  Exmintion Guidelines:  complete evlution includes B-mode imging, spectrl Doppler, color Doppler, nd power Doppler s needed of ll ccessible portions of ech vessel. Bilterl testing is considered n integrl prt of  complete exmintion. Limited exmintions for reoccurring indictions my be performed s noted. The reflux portion of the exm is performed with the ptient in reverse Trendelenburg.  +---------+---------------+---------+-----------+--------------+--------------+ RIGHT    CompressibilityPhsicitySpontneityProperties    Thrombus ging +---------+---------------+---------+-----------+--------------+--------------+ CFV      Full           Yes      Yes                                     +---------+---------------+---------+-----------+--------------+--------------+ SFJ      Full                                                            +---------+---------------+---------+-----------+--------------+--------------+ FV Prox  Full                                                            +---------+---------------+---------+-----------+--------------+--------------+ FV Mid   Full                                                             +---------+---------------+---------+-----------+--------------+--------------+ FV DistlFull                                                            +---------+---------------+---------+-----------+--------------+--------------+ PFV      Full                                                            +---------+---------------+---------+-----------+--------------+--------------+ POP      Prtil        Yes      Yes        Mild strndingChronic        +---------+---------------+---------+-----------+--------------+--------------+ PTV      Full                                                            +---------+---------------+---------+-----------+--------------+--------------+ **Note De-Identified vi Obfusction** [id number] Dte of Birth: 1975-09-18         Ptient Gender: F Ptient ge:   34 yers Exm Loction:  [facility] Procedure:      VS Kore LOWER EXTREMITY VENOUS (DVT) Referring Phys:  POWELL JR --------------------------------------------------------------------------------  Indictions: Pulmonry embolism. Other Indictions: History of PE/DVT. Comprison Study: 11-26-2019 Prior bilterl lower extremity venous study ws                   negtive for DVT. Performing Technologist: Drlin Coco RDMS, RVT  Exmintion Guidelines:  complete evlution includes B-mode imging, spectrl Doppler, color Doppler, nd power Doppler s needed of ll ccessible portions of ech vessel. Bilterl testing is considered n integrl prt of  complete exmintion. Limited exmintions for reoccurring indictions my be performed s noted. The reflux portion of the exm is performed with the ptient in reverse Trendelenburg.  +---------+---------------+---------+-----------+--------------+--------------+ RIGHT    CompressibilityPhsicitySpontneityProperties    Thrombus ging +---------+---------------+---------+-----------+--------------+--------------+ CFV      Full           Yes      Yes                                     +---------+---------------+---------+-----------+--------------+--------------+ SFJ      Full                                                            +---------+---------------+---------+-----------+--------------+--------------+ FV Prox  Full                                                            +---------+---------------+---------+-----------+--------------+--------------+ FV Mid   Full                                                             +---------+---------------+---------+-----------+--------------+--------------+ FV DistlFull                                                            +---------+---------------+---------+-----------+--------------+--------------+ PFV      Full                                                            +---------+---------------+---------+-----------+--------------+--------------+ POP      Prtil        Yes      Yes        Mild strndingChronic        +---------+---------------+---------+-----------+--------------+--------------+ PTV      Full                                                            +---------+---------------+---------+-----------+--------------+--------------+ **Note De-Identified vi Obfusction** [id number] Dte of Birth: [birth year]         Ptient Gender: F Ptient ge:   35 yers Exm Loction:  [facility] Procedure:      VS Kore LOWER EXTREMITY VENOUS (DVT) Referring Phys:  POWELL JR --------------------------------------------------------------------------------  Indictions: Pulmonry embolism. Other Indictions: History of PE/DVT. Comprison Study: 11-26-2019 Prior bilterl lower extremity venous study ws                   negtive for DVT. Performing Technologist: Drlin Coco RDMS, RVT  Exmintion Guidelines:  complete evlution includes B-mode imging, spectrl Doppler, color Doppler, nd power Doppler s needed of ll ccessible portions of ech vessel. Bilterl testing is considered n integrl prt of  complete exmintion. Limited exmintions for reoccurring indictions my be performed s noted. The reflux portion of the exm is performed with the ptient in reverse Trendelenburg.  +---------+---------------+---------+-----------+--------------+--------------+ RIGHT    CompressibilityPhsicitySpontneityProperties    Thrombus ging +---------+---------------+---------+-----------+--------------+--------------+ CFV      Full           Yes      Yes                                     +---------+---------------+---------+-----------+--------------+--------------+ SFJ      Full                                                            +---------+---------------+---------+-----------+--------------+--------------+ FV Prox  Full                                                            +---------+---------------+---------+-----------+--------------+--------------+ FV Mid   Full                                                             +---------+---------------+---------+-----------+--------------+--------------+ FV DistlFull                                                            +---------+---------------+---------+-----------+--------------+--------------+ PFV      Full                                                            +---------+---------------+---------+-----------+--------------+--------------+ POP      Prtil        Yes      Yes        Mild strndingChronic        +---------+---------------+---------+-----------+--------------+--------------+ PTV      Full                                                            +---------+---------------+---------+-----------+--------------+--------------+ **Note De-Identified vi Obfusction** [id number] Dte of Birth: 1975-09-18         Ptient Gender: F Ptient ge:   34 yers Exm Loction:  [facility] Procedure:      VS Kore LOWER EXTREMITY VENOUS (DVT) Referring Phys:  POWELL JR --------------------------------------------------------------------------------  Indictions: Pulmonry embolism. Other Indictions: History of PE/DVT. Comprison Study: 11-26-2019 Prior bilterl lower extremity venous study ws                   negtive for DVT. Performing Technologist: Drlin Coco RDMS, RVT  Exmintion Guidelines:  complete evlution includes B-mode imging, spectrl Doppler, color Doppler, nd power Doppler s needed of ll ccessible portions of ech vessel. Bilterl testing is considered n integrl prt of  complete exmintion. Limited exmintions for reoccurring indictions my be performed s noted. The reflux portion of the exm is performed with the ptient in reverse Trendelenburg.  +---------+---------------+---------+-----------+--------------+--------------+ RIGHT    CompressibilityPhsicitySpontneityProperties    Thrombus ging +---------+---------------+---------+-----------+--------------+--------------+ CFV      Full           Yes      Yes                                     +---------+---------------+---------+-----------+--------------+--------------+ SFJ      Full                                                            +---------+---------------+---------+-----------+--------------+--------------+ FV Prox  Full                                                            +---------+---------------+---------+-----------+--------------+--------------+ FV Mid   Full                                                             +---------+---------------+---------+-----------+--------------+--------------+ FV DistlFull                                                            +---------+---------------+---------+-----------+--------------+--------------+ PFV      Full                                                            +---------+---------------+---------+-----------+--------------+--------------+ POP      Prtil        Yes      Yes        Mild strndingChronic        +---------+---------------+---------+-----------+--------------+--------------+ PTV      Full                                                            +---------+---------------+---------+-----------+--------------+--------------+ **Note De-Identified vi Obfusction** [id number] Dte of Birth: [birth year]         Ptient Gender: F Ptient ge:   35 yers Exm Loction:  [facility] Procedure:      VS Kore LOWER EXTREMITY VENOUS (DVT) Referring Phys:  POWELL JR --------------------------------------------------------------------------------  Indictions: Pulmonry embolism. Other Indictions: History of PE/DVT. Comprison Study: 11-26-2019 Prior bilterl lower extremity venous study ws                   negtive for DVT. Performing Technologist: Drlin Coco RDMS, RVT  Exmintion Guidelines:  complete evlution includes B-mode imging, spectrl Doppler, color Doppler, nd power Doppler s needed of ll ccessible portions of ech vessel. Bilterl testing is considered n integrl prt of  complete exmintion. Limited exmintions for reoccurring indictions my be performed s noted. The reflux portion of the exm is performed with the ptient in reverse Trendelenburg.  +---------+---------------+---------+-----------+--------------+--------------+ RIGHT    CompressibilityPhsicitySpontneityProperties    Thrombus ging +---------+---------------+---------+-----------+--------------+--------------+ CFV      Full           Yes      Yes                                     +---------+---------------+---------+-----------+--------------+--------------+ SFJ      Full                                                            +---------+---------------+---------+-----------+--------------+--------------+ FV Prox  Full                                                            +---------+---------------+---------+-----------+--------------+--------------+ FV Mid   Full                                                             +---------+---------------+---------+-----------+--------------+--------------+ FV DistlFull                                                            +---------+---------------+---------+-----------+--------------+--------------+ PFV      Full                                                            +---------+---------------+---------+-----------+--------------+--------------+ POP      Prtil        Yes      Yes        Mild strndingChronic        +---------+---------------+---------+-----------+--------------+--------------+ PTV      Full                                                            +---------+---------------+---------+-----------+--------------+--------------+ **Note De-Identified vi Obfusction** [id number] Dte of Birth: [birth year]         Ptient Gender: F Ptient ge:   35 yers Exm Loction:  [facility] Procedure:      VS Kore LOWER EXTREMITY VENOUS (DVT) Referring Phys:  POWELL JR --------------------------------------------------------------------------------  Indictions: Pulmonry embolism. Other Indictions: History of PE/DVT. Comprison Study: 11-26-2019 Prior bilterl lower extremity venous study ws                   negtive for DVT. Performing Technologist: Drlin Coco RDMS, RVT  Exmintion Guidelines:  complete evlution includes B-mode imging, spectrl Doppler, color Doppler, nd power Doppler s needed of ll ccessible portions of ech vessel. Bilterl testing is considered n integrl prt of  complete exmintion. Limited exmintions for reoccurring indictions my be performed s noted. The reflux portion of the exm is performed with the ptient in reverse Trendelenburg.  +---------+---------------+---------+-----------+--------------+--------------+ RIGHT    CompressibilityPhsicitySpontneityProperties    Thrombus ging +---------+---------------+---------+-----------+--------------+--------------+ CFV      Full           Yes      Yes                                     +---------+---------------+---------+-----------+--------------+--------------+ SFJ      Full                                                            +---------+---------------+---------+-----------+--------------+--------------+ FV Prox  Full                                                            +---------+---------------+---------+-----------+--------------+--------------+ FV Mid   Full                                                             +---------+---------------+---------+-----------+--------------+--------------+ FV DistlFull                                                            +---------+---------------+---------+-----------+--------------+--------------+ PFV      Full                                                            +---------+---------------+---------+-----------+--------------+--------------+ POP      Prtil        Yes      Yes        Mild strndingChronic        +---------+---------------+---------+-----------+--------------+--------------+ PTV      Full                                                            +---------+---------------+---------+-----------+--------------+--------------+

## 2022-03-24 NOTE — Plan of Care (Signed)
Problem: Education: Goal: Ability to describe self-care measures that may prevent or decrease complications (Diabetes Survival Skills Education) will improve 03/24/2022 1638 by Drucie Ip I, RN Outcome: Progressing 03/24/2022 1638 by Drucie Ip I, RN Outcome: Progressing Goal: Individualized Educational Video(s) 03/24/2022 1638 by Drucie Ip I, RN Outcome: Progressing 03/24/2022 1638 by Drucie Ip I, RN Outcome: Progressing   Problem: Coping: Goal: Ability to adjust to condition or change in health will improve 03/24/2022 1638 by Drucie Ip I, RN Outcome: Progressing 03/24/2022 1638 by Drucie Ip I, RN Outcome: Progressing   Problem: Fluid Volume: Goal: Ability to maintain a balanced intake and output will improve 03/24/2022 1638 by Drucie Ip I, RN Outcome: Progressing 03/24/2022 1638 by Drucie Ip I, RN Outcome: Progressing   Problem: Health Behavior/Discharge Planning: Goal: Ability to identify and utilize available resources and services will improve 03/24/2022 1638 by Drucie Ip I, RN Outcome: Progressing 03/24/2022 1638 by Drucie Ip I, RN Outcome: Progressing Goal: Ability to manage health-related needs will improve 03/24/2022 1638 by Drucie Ip I, RN Outcome: Progressing 03/24/2022 1638 by Drucie Ip I, RN Outcome: Progressing   Problem: Metabolic: Goal: Ability to maintain appropriate glucose levels will improve 03/24/2022 1638 by Drucie Ip I, RN Outcome: Progressing 03/24/2022 1638 by Drucie Ip I, RN Outcome: Progressing   Problem: Nutritional: Goal: Maintenance of adequate nutrition will improve 03/24/2022 1638 by Drucie Ip I, RN Outcome: Progressing 03/24/2022 1638 by Drucie Ip I, RN Outcome: Progressing Goal: Progress toward achieving an optimal weight will improve 03/24/2022 1638 by Drucie Ip I, RN Outcome: Progressing 03/24/2022 1638 by  Drucie Ip I, RN Outcome: Progressing   Problem: Skin Integrity: Goal: Risk for impaired skin integrity will decrease 03/24/2022 1638 by Drucie Ip I, RN Outcome: Progressing 03/24/2022 1638 by Drucie Ip I, RN Outcome: Progressing   Problem: Tissue Perfusion: Goal: Adequacy of tissue perfusion will improve 03/24/2022 1638 by Drucie Ip I, RN Outcome: Progressing 03/24/2022 1638 by Drucie Ip I, RN Outcome: Progressing   Problem: Education: Goal: Knowledge of General Education information will improve Description: Including pain rating scale, medication(s)/side effects and non-pharmacologic comfort measures 03/24/2022 1638 by Drucie Ip I, RN Outcome: Progressing 03/24/2022 1638 by Drucie Ip I, RN Outcome: Progressing   Problem: Health Behavior/Discharge Planning: Goal: Ability to manage health-related needs will improve 03/24/2022 1638 by Drucie Ip I, RN Outcome: Progressing 03/24/2022 1638 by Drucie Ip I, RN Outcome: Progressing   Problem: Clinical Measurements: Goal: Ability to maintain clinical measurements within normal limits will improve 03/24/2022 1638 by Drucie Ip I, RN Outcome: Progressing 03/24/2022 1638 by Drucie Ip I, RN Outcome: Progressing Goal: Will remain free from infection 03/24/2022 1638 by Drucie Ip I, RN Outcome: Progressing 03/24/2022 1638 by Drucie Ip I, RN Outcome: Progressing Goal: Diagnostic test results will improve 03/24/2022 1638 by Drucie Ip I, RN Outcome: Progressing 03/24/2022 1638 by Drucie Ip I, RN Outcome: Progressing Goal: Respiratory complications will improve 03/24/2022 1638 by Drucie Ip I, RN Outcome: Progressing 03/24/2022 1638 by Drucie Ip I, RN Outcome: Progressing Goal: Cardiovascular complication will be avoided 03/24/2022 1638 by Drucie Ip I, RN Outcome: Progressing 03/24/2022 1638 by Drucie Ip I, RN Outcome: Progressing   Problem: Activity: Goal: Risk for activity intolerance will decrease 03/24/2022 1638 by Drucie Ip I, RN Outcome: Progressing 03/24/2022 1638 by Drucie Ip I, RN Outcome: Progressing   Problem: Nutrition: Goal: Adequate nutrition will be maintained 03/24/2022 1638 by Drucie Ip I, RN Outcome: Progressing 03/24/2022 1638 by Drucie Ip I, RN Outcome: Progressing  Problem: Coping: Goal: Level of anxiety will decrease 03/24/2022 1638 by Drucie Ip I, RN Outcome: Progressing 03/24/2022 1638 by Drucie Ip I, RN Outcome: Progressing   Problem: Elimination: Goal: Will not experience complications related to bowel motility 03/24/2022 1638 by Drucie Ip I, RN Outcome: Progressing 03/24/2022 1638 by Drucie Ip I, RN Outcome: Progressing Goal: Will not experience complications related to urinary retention 03/24/2022 1638 by Drucie Ip I, RN Outcome: Progressing 03/24/2022 1638 by Drucie Ip I, RN Outcome: Progressing   Problem: Pain Managment: Goal: General experience of comfort will improve 03/24/2022 1638 by Drucie Ip I, RN Outcome: Progressing 03/24/2022 1638 by Drucie Ip I, RN Outcome: Progressing   Problem: Safety: Goal: Ability to remain free from injury will improve 03/24/2022 1638 by Drucie Ip I, RN Outcome: Progressing 03/24/2022 1638 by Drucie Ip I, RN Outcome: Progressing   Problem: Skin Integrity: Goal: Risk for impaired skin integrity will decrease 03/24/2022 1638 by Drucie Ip I, RN Outcome: Progressing 03/24/2022 1638 by Drucie Ip I, RN Outcome: Progressing

## 2022-03-24 NOTE — TOC Initial Note (Signed)
Transition of Care [facility]) - Initial/Assessment Note    Patient Details  Name: Shirley [name] MRN: [id number] Date of Birth: 11-24-1975  Transition of [facility]) CM/SW Contact:    Bethena Roys, RN Phone Number: 03/24/2022, 3:40 PM  Clinical Narrative: Risk for readmission assessment completed. Patient presented for chest pain. PTA patient was from home. Patient states she has a PCP and transportation to appointments. Plan for patient to transition home on Eliquis. Medications will be sent to [facility] and delivered to the bedside. Patient states she has transportation home. No further needs identified at this time.                   Expected Discharge Plan: Home/Self Care Barriers to Discharge: No Barriers Identified   Patient Goals and CMS Choice Patient states their goals for this hospitalization and ongoing recovery are:: to return home.   Choice offered to / list presented to : NA  Expected Discharge Plan and Services Expected Discharge Plan: Home/Self Care In-house Referral: NA Discharge Planning Services: CM Consult Post Acute Care Choice: NA Living arrangements for the past 2 months: Single Family Home                   DME Agency: NA       HH Arranged: NA   Prior Living Arrangements/Services Living arrangements for the past 2 months: Single Family Home Lives with:: Self Patient language and need for interpreter reviewed:: Yes Do you feel safe going back to the place where you live?: Yes      Need for Family Participation in Patient Care: Yes (Comment) Care giver support system in place?: Yes (comment)   Criminal Activity/Legal Involvement Pertinent to Current Situation/Hospitalization: No - Comment as needed  Activities of Daily Living Home Assistive Devices/Equipment: None ADL Screening (condition at time of admission) Patient's cognitive ability adequate to safely complete daily activities?: Yes Is the patient deaf or have difficulty  hearing?: No Does the patient have difficulty seeing, even when wearing glasses/contacts?: No Does the patient have difficulty concentrating, remembering, or making decisions?: No Patient able to express need for assistance with ADLs?: Yes Does the patient have difficulty dressing or bathing?: No Independently performs ADLs?: Yes (appropriate for developmental age) Does the patient have difficulty walking or climbing stairs?: No Weakness of Legs: Right Weakness of Arms/Hands: None  Permission Sought/Granted Permission sought to share information with : Family Supports, Case Manager   Emotional Assessment Appearance:: Appears stated age Attitude/Demeanor/Rapport: Engaged Affect (typically observed): Appropriate Orientation: : Oriented to Situation, Oriented to  Time, Oriented to Place, Oriented to Self Alcohol / Substance Use: Not Applicable Psych Involvement: No (comment)  Admission diagnosis:  Dehydration [E86.0] Elevated troponin [R79.89] Acute pulmonary embolism ([place]) [I26.99] Chest pain [R07.9] Nausea and vomiting, unspecified vomiting type [R11.2] Patient Active Problem List   Diagnosis Date Noted   Chest pain 03/21/2022   CAD S/P percutaneous coronary angioplasty 03/21/2022   Acute-on-chronic kidney injury ([place]) 03/21/2022   Acute pulmonary embolism ([place]) 03/21/2022   Sepsis ([place]) 04/17/2021   Conjunctivitis 04/17/2021   Abscess 04/16/2021   Abscess of bursa of right foot    Diabetic foot infection ([place]) 04/06/2020   Cellulitis of foot, right    Puncture wound of right foot    Sepsis due to pneumonia ([place]) 11/24/2019   Thrombocytosis 11/24/2019   Chronic pain 11/24/2019   History of pulmonary embolism 10/10/2017   Chronic anticoagulation 10/10/2017   GERD (gastroesophageal reflux disease) 10/10/2017  Leukocytosis 10/10/2017   Prolonged QT interval 10/10/2017   Nausea & vomiting 09/20/2017   Hypertensive urgency 09/20/2017   Intractable nausea and vomiting  08/08/2017   Chronic maxillary sinusitis 01/02/2017   Acute blood loss anemia 11/13/2016   Menorrhagia 11/13/2016   Bilateral pulmonary embolism (HCC) 10/30/2016   Acute DVT (deep venous thrombosis) (HCC) 10/30/2016   Nausea and vomiting 09/23/2016   Type 2 diabetes mellitus with hyperglycemia ([place]) 04/07/2016   Diabetic gastroparesis ([place]) 04/06/2016   Dehydration 03/27/2015   Gastroparesis 11/11/2014   Hematemesis 10/13/2014   DKA (diabetic ketoacidoses) 10/13/2014   Abdominal pain, chronic, left lower quadrant    Diabetic gastroparesis associated with type 2 diabetes mellitus ([place]) 20/35/5974   History of Helicobacter pylori infection 04/22/2014   Vaginal discharge 02/18/2014   Unspecified constipation 07/21/2013   Tinea corporis 07/21/2013   Intractable vomiting 04/29/2013   Dysmenorrhea 04/22/2013   UTI (urinary tract infection) 07/15/2012   Headache(784.0) 02/08/2012   Health care maintenance 01/22/2012   Chronic hepatitis B ([place]) 03/07/2011   History of leukocytosis 04/06/2010   THROMBOCYTOSIS 04/06/2010   Polysubstance abuse ([place]) 02/23/2010   Iron deficiency anemia 11/22/2009   PERIPHERAL NEUROPATHY 10/01/2009   Hyperlipidemia 08/30/2009   Diabetic polyneuropathy ([place]) 08/30/2009   Hidradenitis (recurrent boils) 07/07/2008   Depression 12/27/2007   Abdominal pain, left lower quadrant 11/21/2007   FIBROMYALGIA 10/30/2007   BACK PAIN 04/01/2007   OBSTRUCTIVE SLEEP APNEA 01/17/2007   ANXIETY DEPRESSION 06/27/2006   Chronic ischemic heart disease 06/15/2006   OBESITY, MORBID 05/15/2006   MIGRAINE HEADACHE 05/15/2006   Asthma 05/15/2006   Severe hypertension 01/16/2006   IRREGULAR MENSTRUATION 01/16/2006   PEDAL EDEMA 01/16/2006   Type 2 diabetes mellitus ([place]) 01/16/1989   PCP:  Selena Batten, NP Pharmacy:   [facility] [phone] - [place], [place] [address] [place]  [address] Phone: [phone] Fax: [place] [address] [address] Phone: [phone] Fax: [phone]  Readmission Risk Interventions    03/24/2022    3:38 PM  Readmission Risk Prevention Plan  Transportation Screening Complete  Medication Review (RN Care Manager) Referral to Pharmacy  [place] or [place] Complete  SW Recovery Care/Counseling Consult Complete  [place] Not Applicable

## 2022-03-24 NOTE — Plan of Care (Signed)

## 2022-03-24 NOTE — Progress Notes (Signed)
Discharge instructions (including medications) discussed with and copy provided to patient/caregiver 

## 2022-03-24 NOTE — Progress Notes (Addendum)
[facility] for heparin> apixaban Indication: high probability PE on VQ scan  Allergies  Allergen Reactions   Eggs Or Egg-Derived Products Hives, Itching and Nausea And Vomiting   Lisinopril Nausea Only and Swelling   Morphine And Related Itching and Swelling    Patient Measurements: Height: '5\' 7"'$  (170.2 cm) Weight: 113.4 kg (250 lb)  IBW/kg (Calculated) : 61.6 Heparin Dosing Weight: 90kg  Vital Signs: Temp: 98.3 F (36.8 C) (12/01 0828) Temp Source: Oral (12/01 0828) BP: 154/98 (12/01 0828) Pulse Rate: 113 (12/01 0828)  Labs: Recent Labs    03/22/22 0329 03/22/22 1209 03/23/22 0337 03/24/22 0249  HGB 11.4*  --  10.0* 10.0*  HCT 31.7*  --  28.6* 28.6*  PLT 368  --  325 297  HEPARINUNFRC 0.79* 0.64 0.51 0.46  CREATININE  --   --  2.41* 2.23*     Estimated Creatinine Clearance: 35.2 mL/min (A) (by C-G formula based on SCr of 2.23 mg/dL (H)).   Medical History: Past Medical History:  Diagnosis Date   Abscess of tunica vaginalis    10/09- Abundant S. aureus- sensitive to all abx   Anxiety    Blood dyscrasia    CAD (coronary artery disease) 06/15/2006   s/p Subendocardial MI with PDA angioplasty(no stent) on 06/15/06 and relook  cath 06/19/06 showed patency of site. Cath 12/10- no restenosis or significant CAD progression   CVA (cerebral vascular accident) ([place]) ~ 02/2014   denies residual on 04/22/2014   CVA (cerebral vascular accident) [facility])    history of remote right cerebellar infarct noted on head CT at least since 10/2011   Depression    Diabetes mellitus type 2, uncontrolled, with complications    Fibromyalgia    Gastritis    Gastroparesis    secondary to poorly controlled DM, last emptying study performed 01/2010  was normal but may be falsely positive as pt was on reglan   GERD (gastroesophageal reflux disease)    Hepatitis B, chronic (HCC)    Hep BeAb+,Hep B cAb+ & Hep BsAg+ (9/06)   History of pyelonephritis     H/o GrpB Pyelonephritis (9/06) and UTI- 07/11- E.Coli, 12/10- GBS   Hyperlipidemia    Hypertension    Iron deficiency anemia    Irregular menses    Small ovarian follicles seen on GE(9/52)   MI (myocardial infarction) ([place]) 05/2006   PDA percutaneous transluminal coronary angioplasty   Migraine    "weekly" (04/22/2014)   N&V (nausea and vomiting)    Chronic. Unclear etiology with multiple admission and ED visits. CT abdomen with and without contrast (02/2011)  showed no acute process. Gastic Emptying scan (01/2010) was normal. Ultrasound of the abdomen was within normal limits. Hepatitis B viral load was undectable. HIV NR. EGD - gastritis, Hpylori + s/p Rx   Obesity    OSA (obstructive sleep apnea)    "suppose to wear mask but I don't" (04/22/2014)   Peripheral neuropathy    Pneumonia    "this is probably the 2nd or 3rd time I've had pneumonia" (04/22/2014)   Recurrent boils    Seasonal asthma    Substance abuse ([place])    Thrombocytosis    Hem/Onc suggested 2/2 chronic hepatits and/or iron deficiency anemia    Assessment: 46yo femalen on heparin for PE - high probability on VQ scan. 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 Plans to change to apixaban today (cost- $0)  Goal  of Therapy:  Heparin level 0.3-0.7 units/ml Monitor platelets by anticoagulation protocol: Yes   Plan:  Discontinue heparin  Apixaban '10mg'$  po bid for 7 days then '5mg'$  po bid  Hildred Laser, PharmD Clinical Pharmacist **Pharmacist phone directory can now be found on [email or web address] ([email or web address]).  Listed under [place].

## 2022-03-24 NOTE — Care Management Important Message (Signed)
Important Message  Patient Details  Name: Shirley [name] MRN: [id number] Date of Birth: Nov 03, 1975   Medicare Important Message Given:  Yes     [name] 03/24/2022, 11:41 AM

## 2022-03-24 NOTE — Plan of Care (Signed)
Problem: Education: Goal: Ability to describe self-care measures that may prevent or decrease complications (Diabetes Survival Skills Education) will improve 03/24/2022 1640 by Drucie Ip I, RN Outcome: Progressing 03/24/2022 1638 by Drucie Ip I, RN Outcome: Progressing 03/24/2022 1638 by Drucie Ip I, RN Outcome: Progressing Goal: Individualized Educational Video(s) 03/24/2022 1640 by Drucie Ip I, RN Outcome: Progressing 03/24/2022 1638 by Drucie Ip I, RN Outcome: Progressing 03/24/2022 1638 by Drucie Ip I, RN Outcome: Progressing   Problem: Coping: Goal: Ability to adjust to condition or change in health will improve 03/24/2022 1640 by Drucie Ip I, RN Outcome: Progressing 03/24/2022 1638 by Drucie Ip I, RN Outcome: Progressing 03/24/2022 1638 by Drucie Ip I, RN Outcome: Progressing   Problem: Fluid Volume: Goal: Ability to maintain a balanced intake and output will improve 03/24/2022 1640 by Drucie Ip I, RN Outcome: Progressing 03/24/2022 1638 by Drucie Ip I, RN Outcome: Progressing 03/24/2022 1638 by Drucie Ip I, RN Outcome: Progressing   Problem: Health Behavior/Discharge Planning: Goal: Ability to identify and utilize available resources and services will improve 03/24/2022 1640 by Drucie Ip I, RN Outcome: Progressing 03/24/2022 1638 by Drucie Ip I, RN Outcome: Progressing 03/24/2022 1638 by Drucie Ip I, RN Outcome: Progressing Goal: Ability to manage health-related needs will improve 03/24/2022 1640 by Drucie Ip I, RN Outcome: Progressing 03/24/2022 1638 by Drucie Ip I, RN Outcome: Progressing 03/24/2022 1638 by Drucie Ip I, RN Outcome: Progressing   Problem: Metabolic: Goal: Ability to maintain appropriate glucose levels will improve 03/24/2022 1640 by Drucie Ip I, RN Outcome: Progressing 03/24/2022 1638 by Drucie Ip  I, RN Outcome: Progressing 03/24/2022 1638 by Drucie Ip I, RN Outcome: Progressing   Problem: Nutritional: Goal: Maintenance of adequate nutrition will improve 03/24/2022 1640 by Drucie Ip I, RN Outcome: Progressing 03/24/2022 1638 by Drucie Ip I, RN Outcome: Progressing 03/24/2022 1638 by Drucie Ip I, RN Outcome: Progressing Goal: Progress toward achieving an optimal weight will improve 03/24/2022 1640 by Drucie Ip I, RN Outcome: Progressing 03/24/2022 1638 by Drucie Ip I, RN Outcome: Progressing 03/24/2022 1638 by Drucie Ip I, RN Outcome: Progressing   Problem: Skin Integrity: Goal: Risk for impaired skin integrity will decrease 03/24/2022 1640 by Drucie Ip I, RN Outcome: Progressing 03/24/2022 1638 by Drucie Ip I, RN Outcome: Progressing 03/24/2022 1638 by Drucie Ip I, RN Outcome: Progressing   Problem: Tissue Perfusion: Goal: Adequacy of tissue perfusion will improve 03/24/2022 1640 by Drucie Ip I, RN Outcome: Progressing 03/24/2022 1638 by Drucie Ip I, RN Outcome: Progressing 03/24/2022 1638 by Drucie Ip I, RN Outcome: Progressing   Problem: Education: Goal: Knowledge of General Education information will improve Description: Including pain rating scale, medication(s)/side effects and non-pharmacologic comfort measures 03/24/2022 1640 by Drucie Ip I, RN Outcome: Progressing 03/24/2022 1638 by Drucie Ip I, RN Outcome: Progressing 03/24/2022 1638 by Drucie Ip I, RN Outcome: Progressing   Problem: Health Behavior/Discharge Planning: Goal: Ability to manage health-related needs will improve 03/24/2022 1640 by Drucie Ip I, RN Outcome: Progressing 03/24/2022 1638 by Drucie Ip I, RN Outcome: Progressing 03/24/2022 1638 by Drucie Ip I, RN Outcome: Progressing   Problem: Clinical Measurements: Goal: Ability to maintain clinical  measurements within normal limits will improve 03/24/2022 1640 by Drucie Ip I, RN Outcome: Progressing 03/24/2022 1638 by Drucie Ip I, RN Outcome: Progressing 03/24/2022 1638 by Drucie Ip I, RN Outcome: Progressing Goal: Will remain free from infection 03/24/2022 1640 by Drucie Ip I, RN Outcome: Progressing 03/24/2022 1638 by Drucie Ip I, RN Outcome: Progressing 03/24/2022 1638 by  Drucie Ip I, RN Outcome: Progressing Goal: Diagnostic test results will improve 03/24/2022 1640 by Drucie Ip I, RN Outcome: Progressing 03/24/2022 1638 by Drucie Ip I, RN Outcome: Progressing 03/24/2022 1638 by Drucie Ip I, RN Outcome: Progressing Goal: Respiratory complications will improve 03/24/2022 1640 by Drucie Ip I, RN Outcome: Progressing 03/24/2022 1638 by Drucie Ip I, RN Outcome: Progressing 03/24/2022 1638 by Drucie Ip I, RN Outcome: Progressing Goal: Cardiovascular complication will be avoided 03/24/2022 1640 by Drucie Ip I, RN Outcome: Progressing 03/24/2022 1638 by Drucie Ip I, RN Outcome: Progressing 03/24/2022 1638 by Drucie Ip I, RN Outcome: Progressing   Problem: Activity: Goal: Risk for activity intolerance will decrease 03/24/2022 1640 by Drucie Ip I, RN Outcome: Progressing 03/24/2022 1638 by Drucie Ip I, RN Outcome: Progressing 03/24/2022 1638 by Drucie Ip I, RN Outcome: Progressing   Problem: Nutrition: Goal: Adequate nutrition will be maintained 03/24/2022 1640 by Drucie Ip I, RN Outcome: Progressing 03/24/2022 1638 by Drucie Ip I, RN Outcome: Progressing 03/24/2022 1638 by Drucie Ip I, RN Outcome: Progressing   Problem: Coping: Goal: Level of anxiety will decrease 03/24/2022 1640 by Drucie Ip I, RN Outcome: Progressing 03/24/2022 1638 by Drucie Ip I, RN Outcome: Progressing 03/24/2022 1638 by  Drucie Ip I, RN Outcome: Progressing   Problem: Elimination: Goal: Will not experience complications related to bowel motility 03/24/2022 1640 by Drucie Ip I, RN Outcome: Progressing 03/24/2022 1638 by Drucie Ip I, RN Outcome: Progressing 03/24/2022 1638 by Drucie Ip I, RN Outcome: Progressing Goal: Will not experience complications related to urinary retention 03/24/2022 1640 by Drucie Ip I, RN Outcome: Progressing 03/24/2022 1638 by Drucie Ip I, RN Outcome: Progressing 03/24/2022 1638 by Drucie Ip I, RN Outcome: Progressing   Problem: Pain Managment: Goal: General experience of comfort will improve 03/24/2022 1640 by Drucie Ip I, RN Outcome: Progressing 03/24/2022 1638 by Drucie Ip I, RN Outcome: Progressing 03/24/2022 1638 by Drucie Ip I, RN Outcome: Progressing   Problem: Safety: Goal: Ability to remain free from injury will improve 03/24/2022 1640 by Drucie Ip I, RN Outcome: Progressing 03/24/2022 1638 by Drucie Ip I, RN Outcome: Progressing 03/24/2022 1638 by Drucie Ip I, RN Outcome: Progressing   Problem: Skin Integrity: Goal: Risk for impaired skin integrity will decrease 03/24/2022 1640 by Drucie Ip I, RN Outcome: Progressing 03/24/2022 1638 by Drucie Ip I, RN Outcome: Progressing 03/24/2022 1638 by Drucie Ip I, RN Outcome: Progressing

## 2022-03-25 ENCOUNTER — Other Ambulatory Visit (HOSPITAL_COMMUNITY): Payer: Self-pay

## 2022-04-20 NOTE — Progress Notes (Deleted)
Cardiology Office Note:    Date:  04/20/2022   ID:  Shirley [name], DOB [birth year], MRN [id number]  PCP:  Selena Batten, NP   [facility] Providers Cardiologist:  Mertie Moores, MD { Click to update primary MD,subspecialty MD or APP then REFRESH:1}    Referring MD: Selena Batten, NP   Chief Complaint: ***  History of Present Illness:    Shirley [name] is a *** 46 y.o. female with a hx of MI s/p PDA balloon angioplasty 2008, HLD, type 2 diabetes complicated by chronic recurrent gastroparesis, HTN, CKD 3B, CVA, chronic hepatitis B, GAD, chronically elevated WBC, and substance abuse.  No record of follow-up in cardiology clinic since 2008.   Admission 11/27-12/1/23 for refractory n/v, seen by cardiology for elevated troponin. She had chest pain described as sharp, constant and located in substernal region for the previous 3 days. Found to be hypertensive with systolic in the 299M and tachycardic with heart rate 140-150 bpm. UDS was negative. CXR showed mild cardiomegaly. She was found to have pulmonary embolism of right lower lobe on PET 03/21/22 and was started on Eliquis. LE [place] with chronic DVT involving right popliteal vein. Unclear provoking factor, possible history of previous DVT. TTE revealed EF 45 to 50%, global hypokinesis, mildly elevated PASP, RV SF normal.  No acute changes on EKG, troponin elevated but flat, felt secondary to demand ischemia. Discharged on amlodipine 10 mg daily, carvedilol 25 mg twice daily, hydralazine 10 mg 3 times daily.  Losartan discontinued given CKD. Advised to follow-up with cardiology OP.   Today, she is here    Past Medical History:  Diagnosis Date   Abscess of tunica vaginalis    10/09- Abundant S. aureus- sensitive to all abx   Anxiety    Blood dyscrasia    CAD (coronary artery disease) 06/15/2006   s/p Subendocardial MI with PDA angioplasty(no stent) on 06/15/06 and relook  cath 06/19/06 showed patency of site. Cath 12/10- no  restenosis or significant CAD progression   CVA (cerebral vascular accident) ([place]) ~ 02/2014   denies residual on 04/22/2014   CVA (cerebral vascular accident) [facility])    history of remote right cerebellar infarct noted on head CT at least since 10/2011   Depression    Diabetes mellitus type 2, uncontrolled, with complications    Fibromyalgia    Gastritis    Gastroparesis    secondary to poorly controlled DM, last emptying study performed 01/2010  was normal but may be falsely positive as pt was on reglan   GERD (gastroesophageal reflux disease)    Hepatitis B, chronic (HCC)    Hep BeAb+,Hep B cAb+ & Hep BsAg+ (9/06)   History of pyelonephritis    H/o GrpB Pyelonephritis (9/06) and UTI- 07/11- E.Coli, 12/10- GBS   Hyperlipidemia    Hypertension    Iron deficiency anemia    Irregular menses    Small ovarian follicles seen on EQ(6/83)   MI (myocardial infarction) ([place]) 05/2006   PDA percutaneous transluminal coronary angioplasty   Migraine    "weekly" (04/22/2014)   N&V (nausea and vomiting)    Chronic. Unclear etiology with multiple admission and ED visits. CT abdomen with and without contrast (02/2011)  showed no acute process. Gastic Emptying scan (01/2010) was normal. Ultrasound of the abdomen was within normal limits. Hepatitis B viral load was undectable. HIV NR. EGD - gastritis, Hpylori + s/p Rx   Obesity    OSA (obstructive sleep apnea)    "  suppose to wear mask but I don't" (04/22/2014)   Peripheral neuropathy    Pneumonia    "this is probably the 2nd or 3rd time I've had pneumonia" (04/22/2014)   Recurrent boils    Seasonal asthma    Substance abuse ([place])    Thrombocytosis    Hem/Onc suggested 2/2 chronic hepatits and/or iron deficiency anemia    Past Surgical History:  Procedure Laterality Date   [place]  2008   "2 stents"   ESOPHAGOGASTRODUODENOSCOPY N/A 04/23/2014   Procedure: ESOPHAGOGASTRODUODENOSCOPY  (EGD);  Surgeon: Winfield Cunas., MD;  Location[facility] ENDOSCOPY;  Service: Endoscopy;  Laterality: N/A;   INCISION AND DRAINAGE ABSCESS Right 04/17/2021   Procedure: INCISION AND DRAINAGE CHEST WALL ABSCESS;  Surgeon: Jesusita Oka, MD;  Location: [facility];  Service: General;  Laterality: Right;   IR ANGIOGRAM PELVIS SELECTIVE OR SUPRASELECTIVE  12/07/2016   IR ANGIOGRAM PELVIS SELECTIVE OR SUPRASELECTIVE  12/07/2016   IR ANGIOGRAM SELECTIVE EACH ADDITIONAL VESSEL  12/07/2016   IR ANGIOGRAM SELECTIVE EACH ADDITIONAL VESSEL  12/07/2016   IR EMBO ARTERIAL NOT HEMORR HEMANG INC GUIDE ROADMAPPING  12/07/2016   IR RADIOLOGIST EVAL & MGMT  01/09/2017   IR US GUIDE VASC ACCESS RIGHT  12/07/2016   IRRIGATION AND DEBRIDEMENT FOOT Right 04/07/2020   Procedure: Incision and Drainage Right Foot;  Surgeon: Evelina Bucy, DPM;  Location: [facility];  Service: Podiatry;  Laterality: Right;    Current Medications: No outpatient medications have been marked as taking for the 04/21/22 encounter (Appointment) with Ann Maki, Lanice Schwab, NP.     Allergies:   Eggs or egg-derived products, Lisinopril, and Morphine and related   Social History   Socioeconomic History   Marital status: Single    Spouse name: Not on file   Number of children: 2   Years of education: 11   Highest education level: Not on file  Occupational History   Occupation: other    Comment: unemployed  Tobacco Use   Smoking status: Former    Types: Cigarettes    Quit date: 04/24/1996    Years since quitting: 26.0   Smokeless tobacco: Never   Tobacco comments:    quit smoking cigarettes age 65  Vaping Use   Vaping Use: Never used  Substance and Sexual Activity   Alcohol use: No    Alcohol/week: 0.0 standard drinks of alcohol    Comment: 04/22/2014 "might have a few drinks a month"   Drug use: Not Currently    Types: Marijuana, Cocaine    Comment: 04/22/2104 "quit drugs ~ 1-2 yr ago"   Sexual activity: Yes    Birth control/protection:  None  Other Topics Concern   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   Also works at [employer] having to lift elderly individuals.         Social Determinants of Health   Financial Resource Strain: Not on file  Food Insecurity: No Food Insecurity (03/22/2022)   Hunger Vital Sign    Worried About Running Out of Food in the Last Year: Never true    Ran Out of Food in the Last Year: Never true  Transportation Needs: No Transportation Needs (03/22/2022)   PRAPARE - Hydrologist (Medical): No    Lack of Transportation (Non-Medical): No  Physical Activity:  Not on file  Stress: Not on file  Social Connections: Not on file     Family History: [name] ***family history includes Diabetes in her father; Healthy in her mother.  ROS:   Please see the history of present illness.    *** All other systems reviewed and are negative.  Labs/Other Studies Reviewed:    The following studies were reviewed today:  Echo 03/21/22 1. Left ventricular ejection fraction, by estimation, is 45 to 50%. The  left ventricle has mildly decreased function. The left ventricle  demonstrates global hypokinesis. There is mild concentric left ventricular  hypertrophy. Indeterminate diastolic  filling due to E-A fusion.   2. Right ventricular systolic function is normal. The right ventricular  size is normal. There is mildly elevated pulmonary artery systolic  pressure. The estimated right ventricular systolic pressure is 75.1 mmHg.   3. Left atrial size was moderately dilated.   4. A small pericardial effusion is present. The pericardial effusion is  circumferential.   5. The mitral valve is normal in structure. Mild to moderate mitral valve  regurgitation.   6. Tricuspid valve regurgitation is mild to moderate.   7. The aortic valve is tricuspid. Aortic valve regurgitation is not   visualized. No aortic stenosis is present.  Recent Labs: 03/21/2022: B Natriuretic Peptide 462.5 03/24/2022: ALT 43; BUN 31; Creatinine, Ser 2.23; Hemoglobin 10.0; Magnesium 1.9; Platelets 297; Potassium 4.1; Sodium 137  Recent Lipid Panel    Component Value Date/Time   CHOL 169 07/21/2013 1457   TRIG 100 07/21/2013 1457   HDL 41 07/21/2013 1457   CHOLHDL 4.1 07/21/2013 1457   VLDL 20 07/21/2013 1457   LDLCALC 108 (H) 07/21/2013 1457     Risk Assessment/Calculations:   {Does this patient have ATRIAL FIBRILLATION?:[phone]}       Physical Exam:    VS:  There were no vitals taken for this visit.    Wt Readings from Last 3 Encounters:  03/22/22 194 lb 3.2 oz (88.1 kg)  06/01/21 249 lb 12.5 oz (113.3 kg)  04/26/21 249 lb 12.5 oz (113.3 kg)     GEN: *** Well nourished, well developed in no acute distress HEENT: Normal NECK: No JVD; No carotid bruits CARDIAC: ***RRR, no murmurs, rubs, gallops RESPIRATORY:  Clear to auscultation without rales, wheezing or rhonchi  ABDOMEN: Soft, non-tender, non-distended MUSCULOSKELETAL:  No edema; No deformity. *** pedal pulses, ***bilaterally SKIN: Warm and dry NEUROLOGIC:  Alert and oriented x 3 PSYCHIATRIC:  Normal affect   EKG:  EKG is *** ordered today.  The ekg ordered today demonstrates ***  No BP recorded.  {Refresh Note OR Click here to enter BP  :1}***    Diagnoses:    No diagnosis found. Assessment and Plan:     Elevated troponin: Elevated hs troponin 207 ? 190 during recent hospitalization. Felt to be 2/2 demand ischemia  CAD: s/p MI with balloon angioplasty of PDA 2008. No records to review.  DVT/PE: Acute PE right lower lobe on PET 03/21/2022, history of chronic DVT right popliteal vein on u/s 03/22/22.  Recommend referral to hematology.  Continue [place] indefinitely at this time.   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   CKD Stage 3b:  GFR 27, creatinine 2.23 on 03/24/22.   {Are you ordering a CV Procedure (e.g. stress test, cath, DCCV, TEE, etc)?   Press F2        :[id number]}   Disposition:  Medication Adjustments/Labs and Tests Ordered: Current medicines are reviewed at length with the patient today.  Concerns regarding medicines are outlined above.  No orders of the defined types were placed in this encounter.  No orders of the defined types were placed in this encounter.   There are no Patient Instructions on file for this visit.   Signed, Emmaline Life, NP  04/20/2022 5:28 AM    [place]

## 2022-04-21 ENCOUNTER — Ambulatory Visit: Payer: Medicare Other | Attending: Nurse Practitioner | Admitting: Nurse Practitioner

## 2022-05-16 ENCOUNTER — Inpatient Hospital Stay (HOSPITAL_BASED_OUTPATIENT_CLINIC_OR_DEPARTMENT_OTHER)
Admission: EM | Admit: 2022-05-16 | Discharge: 2022-05-23 | DRG: 291 | Disposition: A | Discharge status: Home / Self Care | Payer: Medicare HMO | Attending: Internal Medicine | Admitting: Internal Medicine

## 2022-05-16 ENCOUNTER — Other Ambulatory Visit: Payer: Self-pay

## 2022-05-16 ENCOUNTER — Encounter (HOSPITAL_BASED_OUTPATIENT_CLINIC_OR_DEPARTMENT_OTHER): Payer: Self-pay

## 2022-05-16 ENCOUNTER — Emergency Department (HOSPITAL_BASED_OUTPATIENT_CLINIC_OR_DEPARTMENT_OTHER): Payer: Medicare HMO | Admitting: Radiology

## 2022-05-16 DIAGNOSIS — Z7985 Long-term (current) use of injectable non-insulin antidiabetic drugs: Secondary | ICD-10-CM

## 2022-05-16 DIAGNOSIS — I10 Essential (primary) hypertension: Secondary | ICD-10-CM | POA: Diagnosis present

## 2022-05-16 DIAGNOSIS — Z86711 Personal history of pulmonary embolism: Secondary | ICD-10-CM

## 2022-05-16 DIAGNOSIS — Z8673 Personal history of transient ischemic attack (TIA), and cerebral infarction without residual deficits: Secondary | ICD-10-CM

## 2022-05-16 DIAGNOSIS — K3184 Gastroparesis: Secondary | ICD-10-CM | POA: Diagnosis present

## 2022-05-16 DIAGNOSIS — Z87891 Personal history of nicotine dependence: Secondary | ICD-10-CM

## 2022-05-16 DIAGNOSIS — B181 Chronic viral hepatitis B without delta-agent: Secondary | ICD-10-CM | POA: Diagnosis present

## 2022-05-16 DIAGNOSIS — Z794 Long term (current) use of insulin: Secondary | ICD-10-CM

## 2022-05-16 DIAGNOSIS — G4733 Obstructive sleep apnea (adult) (pediatric): Secondary | ICD-10-CM | POA: Diagnosis present

## 2022-05-16 DIAGNOSIS — K219 Gastro-esophageal reflux disease without esophagitis: Secondary | ICD-10-CM | POA: Diagnosis present

## 2022-05-16 DIAGNOSIS — E1143 Type 2 diabetes mellitus with diabetic autonomic (poly)neuropathy: Secondary | ICD-10-CM | POA: Diagnosis present

## 2022-05-16 DIAGNOSIS — I3139 Other pericardial effusion (noninflammatory): Secondary | ICD-10-CM | POA: Diagnosis present

## 2022-05-16 DIAGNOSIS — E119 Type 2 diabetes mellitus without complications: Secondary | ICD-10-CM | POA: Diagnosis present

## 2022-05-16 DIAGNOSIS — Z885 Allergy status to narcotic agent status: Secondary | ICD-10-CM

## 2022-05-16 DIAGNOSIS — I130 Hypertensive heart and chronic kidney disease with heart failure and stage 1 through stage 4 chronic kidney disease, or unspecified chronic kidney disease: Secondary | ICD-10-CM | POA: Diagnosis not present

## 2022-05-16 DIAGNOSIS — I272 Pulmonary hypertension, unspecified: Secondary | ICD-10-CM | POA: Diagnosis present

## 2022-05-16 DIAGNOSIS — I5043 Acute on chronic combined systolic (congestive) and diastolic (congestive) heart failure: Secondary | ICD-10-CM | POA: Diagnosis present

## 2022-05-16 DIAGNOSIS — I509 Heart failure, unspecified: Secondary | ICD-10-CM

## 2022-05-16 DIAGNOSIS — N179 Acute kidney failure, unspecified: Secondary | ICD-10-CM | POA: Diagnosis present

## 2022-05-16 DIAGNOSIS — Z7982 Long term (current) use of aspirin: Secondary | ICD-10-CM

## 2022-05-16 DIAGNOSIS — E785 Hyperlipidemia, unspecified: Secondary | ICD-10-CM | POA: Diagnosis present

## 2022-05-16 DIAGNOSIS — I2510 Atherosclerotic heart disease of native coronary artery without angina pectoris: Secondary | ICD-10-CM | POA: Diagnosis present

## 2022-05-16 DIAGNOSIS — N189 Chronic kidney disease, unspecified: Secondary | ICD-10-CM | POA: Diagnosis present

## 2022-05-16 DIAGNOSIS — Z6834 Body mass index (BMI) 34.0-34.9, adult: Secondary | ICD-10-CM

## 2022-05-16 DIAGNOSIS — E1169 Type 2 diabetes mellitus with other specified complication: Secondary | ICD-10-CM | POA: Diagnosis present

## 2022-05-16 DIAGNOSIS — Z91148 Patient's other noncompliance with medication regimen for other reason: Secondary | ICD-10-CM

## 2022-05-16 DIAGNOSIS — I82409 Acute embolism and thrombosis of unspecified deep veins of unspecified lower extremity: Secondary | ICD-10-CM | POA: Diagnosis present

## 2022-05-16 DIAGNOSIS — Z79899 Other long term (current) drug therapy: Secondary | ICD-10-CM

## 2022-05-16 DIAGNOSIS — J4489 Other specified chronic obstructive pulmonary disease: Secondary | ICD-10-CM | POA: Diagnosis present

## 2022-05-16 DIAGNOSIS — I252 Old myocardial infarction: Secondary | ICD-10-CM

## 2022-05-16 DIAGNOSIS — N184 Chronic kidney disease, stage 4 (severe): Secondary | ICD-10-CM | POA: Diagnosis present

## 2022-05-16 DIAGNOSIS — M797 Fibromyalgia: Secondary | ICD-10-CM | POA: Diagnosis present

## 2022-05-16 DIAGNOSIS — Z888 Allergy status to other drugs, medicaments and biological substances status: Secondary | ICD-10-CM

## 2022-05-16 DIAGNOSIS — Z86718 Personal history of other venous thrombosis and embolism: Secondary | ICD-10-CM

## 2022-05-16 DIAGNOSIS — Z955 Presence of coronary angioplasty implant and graft: Secondary | ICD-10-CM

## 2022-05-16 DIAGNOSIS — N1832 Chronic kidney disease, stage 3b: Secondary | ICD-10-CM | POA: Diagnosis present

## 2022-05-16 DIAGNOSIS — R079 Chest pain, unspecified: Secondary | ICD-10-CM | POA: Diagnosis present

## 2022-05-16 DIAGNOSIS — R600 Localized edema: Secondary | ICD-10-CM

## 2022-05-16 DIAGNOSIS — R0602 Shortness of breath: Secondary | ICD-10-CM

## 2022-05-16 DIAGNOSIS — I5023 Acute on chronic systolic (congestive) heart failure: Secondary | ICD-10-CM | POA: Diagnosis present

## 2022-05-16 DIAGNOSIS — Z91012 Allergy to eggs: Secondary | ICD-10-CM

## 2022-05-16 DIAGNOSIS — E1165 Type 2 diabetes mellitus with hyperglycemia: Secondary | ICD-10-CM | POA: Diagnosis not present

## 2022-05-16 DIAGNOSIS — I2781 Cor pulmonale (chronic): Secondary | ICD-10-CM | POA: Diagnosis present

## 2022-05-16 DIAGNOSIS — Z7901 Long term (current) use of anticoagulants: Secondary | ICD-10-CM

## 2022-05-16 DIAGNOSIS — E1122 Type 2 diabetes mellitus with diabetic chronic kidney disease: Secondary | ICD-10-CM | POA: Diagnosis present

## 2022-05-16 DIAGNOSIS — Z833 Family history of diabetes mellitus: Secondary | ICD-10-CM

## 2022-05-16 DIAGNOSIS — E669 Obesity, unspecified: Secondary | ICD-10-CM | POA: Diagnosis present

## 2022-05-16 DIAGNOSIS — I2699 Other pulmonary embolism without acute cor pulmonale: Secondary | ICD-10-CM | POA: Diagnosis present

## 2022-05-16 DIAGNOSIS — Z1152 Encounter for screening for COVID-19: Secondary | ICD-10-CM

## 2022-05-16 DIAGNOSIS — I5022 Chronic systolic (congestive) heart failure: Secondary | ICD-10-CM | POA: Diagnosis present

## 2022-05-16 HISTORY — DX: Heart failure, unspecified: I50.9

## 2022-05-16 LAB — RESP PANEL BY RT-PCR (RSV, FLU A&B, COVID)  RVPGX2
Influenza A by PCR: NEGATIVE
Influenza B by PCR: NEGATIVE
Resp Syncytial Virus by PCR: NEGATIVE
SARS Coronavirus 2 by RT PCR: NEGATIVE

## 2022-05-16 LAB — CBC
HCT: 32.4 % — ABNORMAL LOW (ref 36.0–46.0)
Hemoglobin: 11 g/dL — ABNORMAL LOW (ref 12.0–15.0)
MCH: 25.8 pg — ABNORMAL LOW (ref 26.0–34.0)
MCHC: 34 g/dL (ref 30.0–36.0)
MCV: 75.9 fL — ABNORMAL LOW (ref 80.0–100.0)
Platelets: 382 10*3/uL (ref 150–400)
RBC: 4.27 MIL/uL (ref 3.87–5.11)
RDW: 16.9 % — ABNORMAL HIGH (ref 11.5–15.5)
WBC: 10.3 10*3/uL (ref 4.0–10.5)
nRBC: 0 % (ref 0.0–0.2)

## 2022-05-16 LAB — BASIC METABOLIC PANEL
Anion gap: 7 (ref 5–15)
BUN: 29 mg/dL — ABNORMAL HIGH (ref 6–20)
CO2: 23 mmol/L (ref 22–32)
Calcium: 9 mg/dL (ref 8.9–10.3)
Chloride: 105 mmol/L (ref 98–111)
Creatinine, Ser: 2.47 mg/dL — ABNORMAL HIGH (ref 0.44–1.00)
GFR, Estimated: 23 mL/min — ABNORMAL LOW (ref >60)
Glucose, Bld: 65 mg/dL — ABNORMAL LOW (ref 70–99)
Potassium: 3.9 mmol/L (ref 3.5–5.1)
Sodium: 135 mmol/L (ref 135–145)

## 2022-05-16 LAB — D-DIMER, QUANTITATIVE: D-Dimer, Quant: 1.25 ug/mL-FEU — ABNORMAL HIGH (ref 0.00–0.50)

## 2022-05-16 LAB — BRAIN NATRIURETIC PEPTIDE: B Natriuretic Peptide: 131.7 pg/mL — ABNORMAL HIGH (ref 0.0–100.0)

## 2022-05-16 LAB — TROPONIN I (HIGH SENSITIVITY)
Troponin I (High Sensitivity): 48 ng/L — ABNORMAL HIGH (ref <18)
Troponin I (High Sensitivity): 44 ng/L — ABNORMAL HIGH (ref <18)

## 2022-05-16 LAB — CBG MONITORING, ED: Glucose-Capillary: 61 mg/dL — ABNORMAL LOW (ref 70–99)

## 2022-05-16 MED ORDER — FUROSEMIDE 10 MG/ML IJ SOLN
40.0000 mg | Freq: Once | INTRAMUSCULAR | Status: AC
  Administered 2022-05-16: 40 mg via INTRAVENOUS
  Filled 2022-05-16: qty 4

## 2022-05-16 NOTE — ED Triage Notes (Signed)
Pt arrives POV with c/o of shortness of breath and bilateral leg swelling.  She was hospitalized approximately a month ago with PE and DVT, currently taking Eliquis daily.  She was told by her PCP to come for further evaluation d/t history, as well as shortness of breath and leg swelling.

## 2022-05-16 NOTE — ED Provider Triage Note (Signed)
Emergency Medicine Provider Triage Evaluation Note  Shirley [name] , a 47 y.o. female  was evaluated in triage.  Pt complains of shortness of breath, chest pain, and bilateral lower extremity swelling.  Patient was hospitalized approximately 1 month ago with PE and DVT, currently taking Eliquis daily, has only missed today's dose due to nausea.  She was told by her PCP to come to ED for further eval due to her history.  Symptoms started Thursday and worsening since.  Hx also significant for HTN, MI with stent placement, and CVA.   Review of Systems  Positive: As above Negative: As above  Physical Exam  BP (!) 192/120 (BP Location: Right Arm)   Pulse (!) 110   Temp 97.8 F (36.6 C)   Resp 18   Ht '5\' 7"'$  (1.702 m)   Wt 97.1 kg   SpO2 100%   BMI 33.52 kg/m  Gen:   Awake, no distress   Resp:  Normal effort, lungs clear to auscultation bilaterally MSK:   Moves extremities without difficulty  Other:  Heart rate tachycardic around 110 with occasional PVCs, no appreciable murmur  Medical Decision Making  Medically screening exam initiated at 5:45 PM.  Appropriate orders placed.  [name] was informed that the remainder of the evaluation will be completed by another provider, this initial triage assessment does not replace that evaluation, and the importance of remaining in the ED until their evaluation is complete.     [name], [place] 05/16/22 [phone]

## 2022-05-16 NOTE — ED Provider Notes (Signed)
[name] Provider Note   CSN: [id number] Arrival date & time: 05/16/22  1706     History {Add pertinent medical, surgical, social history, OB history to HPI:1} Chief Complaint  Patient presents with   Shortness of Breath    Shirley [name] is a 47 y.o. female.   Shortness of Breath      Home Medications Prior to Admission medications   Medication Sig Start Date End Date Taking? Authorizing Provider  acetaminophen (TYLENOL) 500 MG tablet Take 1,000 mg by mouth every 6 (six) hours as needed for mild pain, moderate pain or headache.    [provider]  albuterol (VENTOLIN HFA) 108 (90 Base) MCG/ACT inhaler Inhale 2 puffs into the lungs every 4 (four) hours as needed for wheezing or shortness of breath. 11/30/19   Domenic Polite, MD  amLODipine (NORVASC) 10 MG tablet Take 1 tablet (10 mg total) by mouth daily. 04/26/21   Aline August, MD  apixaban (ELIQUIS) 5 MG TABS tablet Take 1 tablet (5 mg total) by mouth 2 (two) times daily. *****Start when you finish the starter pack***** 04/19/22 06/18/22  Elodia Florence., MD  APIXABAN [facility] [place]) VTE STARTER PACK ('10MG'$  AND '5MG'$ ) Take as directed on package: start with two-'5mg'$  tablets twice daily for 7 days. On day 8, switch to one-'5mg'$  tablet twice daily. 03/24/22   Elodia Florence., MD  aspirin 81 MG chewable tablet Chew 1 tablet (81 mg total) by mouth daily. 03/25/22 05/24/22  Elodia Florence., MD  atorvastatin (LIPITOR) 40 MG tablet Take 1 tablet (40 mg total) by mouth daily. 03/25/22 05/24/22  Elodia Florence., MD  carvedilol (COREG) 25 MG tablet Take 1 tablet (25 mg total) by mouth 2 (two) times daily with a meal. 03/24/22 05/23/22  Elodia Florence., MD  cyclobenzaprine (FLEXERIL) 10 MG tablet Take 10 mg by mouth 3 (three) times daily as needed for muscle spasms.    [provider]  dicyclomine (BENTYL) 10 MG capsule Take 1 capsule (10 mg total) by mouth 4  (four) times daily -  before meals and at bedtime. 08/20/21   Fransico Meadow, PA-C  diphenhydrAMINE (BENADRYL) 25 MG tablet Take 25 mg by mouth every 6 (six) hours as needed for allergies.    [provider]  famotidine (PEPCID) 20 MG tablet Take 20 mg by mouth every evening. 04/11/21   [provider]  gabapentin (NEURONTIN) 400 MG capsule Take 1 capsule (400 mg total) by mouth daily at 6 (six) AM. Patient taking differently: Take 400 mg by mouth See admin instructions. Take 400 mg by mouth one to three times a day as needed for neuropathic pain 04/26/21   Aline August, MD  glucose blood (ONETOUCH VERIO) test strip Use as instructed to check blood sugar 3 times daily. Dx codes: E11.8, E11.65 12/25/18   Charlott Rakes, MD  hydrALAZINE (APRESOLINE) 10 MG tablet Take 1 tablet (10 mg total) by mouth 3 (three) times daily. Follow up with your PCP for titration of this medicine 03/24/22 04/23/22  Elodia Florence., MD  Insulin Pen Needle (PEN NEEDLES 3/16") 31G X 5 MM MISC 1 Device by Does not apply route 4 (four) times daily - after meals and at bedtime. 11/28/18   Kerin Perna, NP  INSULIN SYRINGE .5CC/28G (INS SYRINGE/NEEDLE .5CC/28G) 28G X 1/2" 0.5 ML MISC Please provide 1 month supply 09/04/17   Colbert Ewing, MD  linaclotide [facility]) 145 MCG  CAPS capsule Take 145 mcg by mouth daily as needed (for constipation).    [provider]  meclizine (ANTIVERT) 25 MG tablet Take 1 tablet (25 mg total) by mouth 3 (three) times daily as needed for dizziness. 04/26/21   Aline August, MD  metoCLOPramide (REGLAN) 5 MG tablet Take 1 tablet (5 mg total) by mouth 3 (three) times daily for 14 days. Follow up with your PCP to determine if this needs to be continued 03/24/22 04/07/22  Elodia Florence., MD  [facility] 5 MG/0.5ML Pen Inject 5 mg into the skin every Sunday. 06/15/21   [provider]  omeprazole (PRILOSEC) 40 MG capsule Take 40 mg by mouth at bedtime. 03/10/21    [provider]  ondansetron (ZOFRAN-ODT) 4 MG disintegrating tablet Take 4 mg by mouth daily as needed for nausea or vomiting (DISSOLVE ORALLY).    [provider]  OneTouch Delica Lancets 19F MISC Use to check blood sugar 3 times daily. Dx codes: E11.8, E11.65 13-Dec-2018   Charlott Rakes, MD  oxyCODONE-acetaminophen (PERCOCET) 10-325 MG tablet Take 1 tablet by mouth 3 (three) times daily as needed for pain.    [provider]  promethazine (PHENERGAN) 25 MG tablet Take 25 mg by mouth every 6 (six) hours as needed for nausea or vomiting.    [provider]  senna (SENOKOT) 8.6 MG TABS tablet Take 1 tablet (8.6 mg total) by mouth 2 (two) times daily. Patient taking differently: Take 1 tablet by mouth 2 (two) times daily as needed for mild constipation. 04/26/21   Aline August, MD      Allergies    Eggs or egg-derived products, Lisinopril, and Morphine and related    Review of Systems   Review of Systems  Respiratory:  Positive for shortness of breath.     Physical Exam Updated Vital Signs BP (!) 192/120 (BP Location: Right Arm)   Pulse (!) 110   Temp 97.8 F (36.6 C)   Resp 18   Ht '5\' 7"'$  (1.702 m)   Wt 97.1 kg   SpO2 100%   BMI 33.52 kg/m  Physical Exam  ED Results / Procedures / Treatments   Labs (all labs ordered are listed, but only abnormal results are displayed) Labs Reviewed  CBC  BASIC METABOLIC PANEL  D-DIMER, QUANTITATIVE  TROPONIN I (HIGH SENSITIVITY)    EKG EKG Interpretation  Date/Time:  Tuesday May 16 2022 17:37:10 EST Ventricular Rate:  108 PR Interval:  126 QRS Duration: 72 QT Interval:  350 QTC Calculation: 469 R Axis:   48 Text Interpretation: Sinus tachycardia with occasional Premature ventricular complexes Anterior infarct (cited on or before 22-Mar-2022) Abnormal ECG When compared with ECG of 24-Mar-2022 11:29, Premature ventricular complexes are now Present Nonspecific T wave abnormality no longer evident  in Inferior leads Confirmed by Regan Lemming ([id number]) on 05/16/2022 5:48:36 PM  Radiology DG Chest 2 View  Result Date: 05/16/2022 CLINICAL DATA:  Chest pain and shortness of breath EXAM: CHEST - 2 VIEW COMPARISON:  03/20/2022 and older FINDINGS: Enlarged cardiopericardial silhouette with vascular congestion. No consolidation or pneumothorax. Question tiny left effusion. IMPRESSION: Enlarged cardiopericardial silhouette with vascular congestion. Tiny left effusion Electronically Signed   By: Jill Side M.D.   On: 05/16/2022 18:28    Procedures Procedures  {Document cardiac monitor, telemetry assessment procedure when appropriate:1}  Medications Ordered in ED Medications - No data to display  ED Course/ Medical Decision Making/ A&P   {   Click here for ABCD2,  HEART and other calculatorsREFRESH Note before signing :1}                          Medical Decision Making Amount and/or Complexity of Data Reviewed Labs: ordered.   ***  {Document critical care time when appropriate:1} {Document review of labs and clinical decision tools ie heart score, Chads2Vasc2 etc:1}  {Document your independent review of radiology images, and any outside records:1} {Document your discussion with family members, caretakers, and with consultants:1} {Document social determinants of health affecting pt's care:1} {Document your decision making why or why not admission, treatments were needed:1} Final Clinical Impression(s) / ED Diagnoses Final diagnoses:  None    Rx / DC Orders ED Discharge Orders     None

## 2022-05-17 DIAGNOSIS — K3184 Gastroparesis: Secondary | ICD-10-CM | POA: Diagnosis not present

## 2022-05-17 DIAGNOSIS — G4733 Obstructive sleep apnea (adult) (pediatric): Secondary | ICD-10-CM | POA: Diagnosis not present

## 2022-05-17 DIAGNOSIS — N184 Chronic kidney disease, stage 4 (severe): Secondary | ICD-10-CM | POA: Diagnosis not present

## 2022-05-17 DIAGNOSIS — I2781 Cor pulmonale (chronic): Secondary | ICD-10-CM | POA: Diagnosis not present

## 2022-05-17 DIAGNOSIS — I509 Heart failure, unspecified: Secondary | ICD-10-CM

## 2022-05-17 DIAGNOSIS — I5043 Acute on chronic combined systolic (congestive) and diastolic (congestive) heart failure: Secondary | ICD-10-CM | POA: Diagnosis not present

## 2022-05-17 DIAGNOSIS — E1169 Type 2 diabetes mellitus with other specified complication: Secondary | ICD-10-CM | POA: Diagnosis not present

## 2022-05-17 DIAGNOSIS — M797 Fibromyalgia: Secondary | ICD-10-CM | POA: Diagnosis not present

## 2022-05-17 DIAGNOSIS — Z7901 Long term (current) use of anticoagulants: Secondary | ICD-10-CM | POA: Diagnosis not present

## 2022-05-17 DIAGNOSIS — R0602 Shortness of breath: Secondary | ICD-10-CM | POA: Diagnosis present

## 2022-05-17 DIAGNOSIS — E669 Obesity, unspecified: Secondary | ICD-10-CM | POA: Diagnosis not present

## 2022-05-17 DIAGNOSIS — I2510 Atherosclerotic heart disease of native coronary artery without angina pectoris: Secondary | ICD-10-CM | POA: Diagnosis not present

## 2022-05-17 DIAGNOSIS — E1143 Type 2 diabetes mellitus with diabetic autonomic (poly)neuropathy: Secondary | ICD-10-CM | POA: Diagnosis not present

## 2022-05-17 DIAGNOSIS — E1165 Type 2 diabetes mellitus with hyperglycemia: Secondary | ICD-10-CM | POA: Diagnosis not present

## 2022-05-17 DIAGNOSIS — I130 Hypertensive heart and chronic kidney disease with heart failure and stage 1 through stage 4 chronic kidney disease, or unspecified chronic kidney disease: Secondary | ICD-10-CM | POA: Diagnosis not present

## 2022-05-17 DIAGNOSIS — N179 Acute kidney failure, unspecified: Secondary | ICD-10-CM | POA: Diagnosis not present

## 2022-05-17 DIAGNOSIS — J4489 Other specified chronic obstructive pulmonary disease: Secondary | ICD-10-CM | POA: Diagnosis not present

## 2022-05-17 DIAGNOSIS — Z1152 Encounter for screening for COVID-19: Secondary | ICD-10-CM | POA: Diagnosis not present

## 2022-05-17 DIAGNOSIS — I5023 Acute on chronic systolic (congestive) heart failure: Secondary | ICD-10-CM | POA: Diagnosis present

## 2022-05-17 DIAGNOSIS — K219 Gastro-esophageal reflux disease without esophagitis: Secondary | ICD-10-CM | POA: Diagnosis not present

## 2022-05-17 DIAGNOSIS — I272 Pulmonary hypertension, unspecified: Secondary | ICD-10-CM | POA: Diagnosis not present

## 2022-05-17 DIAGNOSIS — I3139 Other pericardial effusion (noninflammatory): Secondary | ICD-10-CM | POA: Diagnosis not present

## 2022-05-17 DIAGNOSIS — E1122 Type 2 diabetes mellitus with diabetic chronic kidney disease: Secondary | ICD-10-CM | POA: Diagnosis not present

## 2022-05-17 DIAGNOSIS — Z794 Long term (current) use of insulin: Secondary | ICD-10-CM | POA: Diagnosis not present

## 2022-05-17 DIAGNOSIS — E785 Hyperlipidemia, unspecified: Secondary | ICD-10-CM | POA: Diagnosis not present

## 2022-05-17 DIAGNOSIS — B181 Chronic viral hepatitis B without delta-agent: Secondary | ICD-10-CM | POA: Diagnosis not present

## 2022-05-17 DIAGNOSIS — I252 Old myocardial infarction: Secondary | ICD-10-CM | POA: Diagnosis not present

## 2022-05-17 LAB — GLUCOSE, CAPILLARY
Glucose-Capillary: 131 mg/dL — ABNORMAL HIGH (ref 70–99)
Glucose-Capillary: 119 mg/dL — ABNORMAL HIGH (ref 70–99)

## 2022-05-17 LAB — RAPID URINE DRUG SCREEN, HOSP PERFORMED
Amphetamines: NOT DETECTED
Barbiturates: NOT DETECTED
Benzodiazepines: NOT DETECTED
Cocaine: NOT DETECTED
Opiates: NOT DETECTED
Tetrahydrocannabinol: NOT DETECTED

## 2022-05-17 LAB — CBG MONITORING, ED
Glucose-Capillary: 64 mg/dL — ABNORMAL LOW (ref 70–99)
Glucose-Capillary: 71 mg/dL (ref 70–99)
Glucose-Capillary: 73 mg/dL (ref 70–99)
Glucose-Capillary: 81 mg/dL (ref 70–99)
Glucose-Capillary: 90 mg/dL (ref 70–99)

## 2022-05-17 LAB — HIV ANTIBODY (ROUTINE TESTING W REFLEX): HIV Screen 4th Generation wRfx: NONREACTIVE

## 2022-05-17 MED ORDER — DICYCLOMINE HCL 10 MG PO CAPS
10.0000 mg | ORAL_CAPSULE | Freq: Three times a day (TID) | ORAL | Status: DC
  Administered 2022-05-17 – 2022-05-23 (×26): 10 mg via ORAL
  Filled 2022-05-17 (×26): qty 1

## 2022-05-17 MED ORDER — APIXABAN 5 MG PO TABS
5.0000 mg | ORAL_TABLET | Freq: Two times a day (BID) | ORAL | Status: DC
  Administered 2022-05-17 – 2022-05-23 (×14): 5 mg via ORAL
  Filled 2022-05-17: qty 1
  Filled 2022-05-17: qty 2
  Filled 2022-05-17: qty 1
  Filled 2022-05-17: qty 2
  Filled 2022-05-17 (×10): qty 1

## 2022-05-17 MED ORDER — HYDRALAZINE HCL 25 MG PO TABS
25.0000 mg | ORAL_TABLET | Freq: Three times a day (TID) | ORAL | Status: DC
  Administered 2022-05-17 – 2022-05-23 (×18): 25 mg via ORAL
  Filled 2022-05-17 (×18): qty 1

## 2022-05-17 MED ORDER — DEXTROSE 5 % IV SOLN
Freq: Once | INTRAVENOUS | Status: AC
  Administered 2022-05-17: 500 mL via INTRAVENOUS

## 2022-05-17 MED ORDER — TIRZEPATIDE 5 MG/0.5ML ~~LOC~~ SOAJ: 5.0000 mg | SUBCUTANEOUS | Status: DC

## 2022-05-17 MED ORDER — SODIUM CHLORIDE 0.9 % IV SOLN: 250.0000 mL | INTRAVENOUS | Status: DC | PRN

## 2022-05-17 MED ORDER — GABAPENTIN 400 MG PO CAPS
400.0000 mg | ORAL_CAPSULE | Freq: Three times a day (TID) | ORAL | Status: DC | PRN
  Filled 2022-05-17 (×2): qty 1

## 2022-05-17 MED ORDER — PANTOPRAZOLE SODIUM 40 MG PO TBEC
40.0000 mg | DELAYED_RELEASE_TABLET | Freq: Every day | ORAL | Status: DC
  Administered 2022-05-17 – 2022-05-19 (×3): 40 mg via ORAL
  Filled 2022-05-17 (×3): qty 1

## 2022-05-17 MED ORDER — ATORVASTATIN CALCIUM 40 MG PO TABS
40.0000 mg | ORAL_TABLET | Freq: Every day | ORAL | Status: DC
  Administered 2022-05-17 – 2022-05-23 (×7): 40 mg via ORAL
  Filled 2022-05-17 (×8): qty 1

## 2022-05-17 MED ORDER — CARVEDILOL 25 MG PO TABS
25.0000 mg | ORAL_TABLET | Freq: Two times a day (BID) | ORAL | Status: DC
  Administered 2022-05-17 – 2022-05-23 (×13): 25 mg via ORAL
  Filled 2022-05-17 (×12): qty 1
  Filled 2022-05-17: qty 2

## 2022-05-17 MED ORDER — GABAPENTIN 300 MG PO CAPS: 400.0000 mg | ORAL_CAPSULE | ORAL | Status: DC

## 2022-05-17 MED ORDER — FENTANYL CITRATE PF 50 MCG/ML IJ SOSY
50.0000 ug | PREFILLED_SYRINGE | Freq: Once | INTRAMUSCULAR | Status: AC
  Administered 2022-05-17: 50 ug via INTRAVENOUS
  Filled 2022-05-17: qty 1

## 2022-05-17 MED ORDER — ACETAMINOPHEN 500 MG PO TABS
1000.0000 mg | ORAL_TABLET | Freq: Four times a day (QID) | ORAL | Status: DC | PRN
  Administered 2022-05-17: 1000 mg via ORAL
  Filled 2022-05-17: qty 2

## 2022-05-17 MED ORDER — AMLODIPINE BESYLATE 10 MG PO TABS
10.0000 mg | ORAL_TABLET | Freq: Every day | ORAL | Status: DC
  Administered 2022-05-17 – 2022-05-23 (×7): 10 mg via ORAL
  Filled 2022-05-17 (×6): qty 1
  Filled 2022-05-17: qty 2

## 2022-05-17 MED ORDER — ISOSORBIDE MONONITRATE ER 30 MG PO TB24
30.0000 mg | ORAL_TABLET | Freq: Every day | ORAL | Status: DC
  Administered 2022-05-17 – 2022-05-23 (×7): 30 mg via ORAL
  Filled 2022-05-17 (×7): qty 1

## 2022-05-17 MED ORDER — SODIUM CHLORIDE 0.9% FLUSH: 3.0000 mL | INTRAVENOUS | Status: DC | PRN

## 2022-05-17 MED ORDER — FUROSEMIDE 10 MG/ML IJ SOLN
40.0000 mg | Freq: Once | INTRAMUSCULAR | Status: AC
  Administered 2022-05-17: 40 mg via INTRAVENOUS
  Filled 2022-05-17: qty 4

## 2022-05-17 MED ORDER — HYDRALAZINE HCL 20 MG/ML IJ SOLN
10.0000 mg | Freq: Once | INTRAMUSCULAR | Status: AC
  Administered 2022-05-17: 10 mg via INTRAVENOUS
  Filled 2022-05-17: qty 1

## 2022-05-17 MED ORDER — SODIUM CHLORIDE 0.9% FLUSH
3.0000 mL | Freq: Two times a day (BID) | INTRAVENOUS | Status: DC
  Administered 2022-05-17 – 2022-05-23 (×11): 3 mL via INTRAVENOUS

## 2022-05-17 MED ORDER — ACETAMINOPHEN 325 MG PO TABS
650.0000 mg | ORAL_TABLET | ORAL | Status: DC | PRN
  Administered 2022-05-18 – 2022-05-23 (×4): 650 mg via ORAL
  Filled 2022-05-17 (×4): qty 2

## 2022-05-17 MED ORDER — FAMOTIDINE 20 MG PO TABS
20.0000 mg | ORAL_TABLET | Freq: Every evening | ORAL | Status: DC
  Administered 2022-05-17 – 2022-05-22 (×6): 20 mg via ORAL
  Filled 2022-05-17 (×6): qty 1

## 2022-05-17 MED ORDER — INSULIN ASPART 100 UNIT/ML IJ SOLN: 0.0000 [IU] | Freq: Three times a day (TID) | INTRAMUSCULAR | Status: DC

## 2022-05-17 NOTE — Progress Notes (Signed)
Pt is still on one time dose of D5 due to previous hypoglycemia event. Pt's blood sugar was 131 after eating dinner while infusing D5.  Notified Dr. Roosevelt Locks. The insulin coverage will start tomorrow morning per Roosevelt Locks.

## 2022-05-17 NOTE — H&P (Signed)
History and Physical    Shirley [name] GNF:[id number] DOB: April 24, 1976 DOA: 05/16/2022  PCP: Selena Batten, NP (Confirm with patient/family/NH records and if not entered, this has to be entered at [facility] point of entry) Patient coming from: Home  I have personally briefly reviewed patient's old medical records in [place]  Chief Complaint: Chest pain shortness of breath leg swelling  HPI: Shirley [name] is a 47 y.o. female with medical history significant of recently diagnosed DVT and bilateral PE, chronic combined HFrEF and HFpEF, CAD status post stenting 2008, CVA in 2015, CKD stage IV, IIDM, came with worsening of chest pain, shortness of breath, leg swelling.  Reported that she has been compliant with all her medications but she started to have exertional dyspnea and pressure-like chest pain started last Thursday.  In addition, she started to notice bilateral leg swelling.  She describes the pain as squeezing like, 5-6/10, constant, worsening with cough.  And her cough has been dry.  No accompanying symptoms such as nauseous vomiting or palpitations.  She is not sure about her home BP meds, and she is not sure about her weight changes.  ED Course: Blood pressure significantly elevated since SBP 190s and DBP lower 100s.  Patient was given IV Lasix 40 mg x 1.  Chest x-ray showed pulmonary congestion, troponin trending 48> 44.  EKG showed no ischemic changes.  Creatinine level 2.4 about her baseline.  Review of Systems: As per HPI otherwise 14 point review of systems negative.    Past Medical History:  Diagnosis Date   Abscess of tunica vaginalis    10/09- Abundant S. aureus- sensitive to all abx   Anxiety    Blood dyscrasia    CAD (coronary artery disease) 06/15/2006   s/p Subendocardial MI with PDA angioplasty(no stent) on 06/15/06 and relook  cath 06/19/06 showed patency of site. Cath 12/10- no restenosis or significant CAD progression   CVA (cerebral vascular accident) ([place]) ~  02/2014   denies residual on 04/22/2014   CVA (cerebral vascular accident) [facility] 2)    history of remote right cerebellar infarct noted on head CT at least since 10/2011   Depression    Diabetes mellitus type 2, uncontrolled, with complications    Fibromyalgia    Gastritis    Gastroparesis    secondary to poorly controlled DM, last emptying study performed 01/2010  was normal but may be falsely positive as pt was on reglan   GERD (gastroesophageal reflux disease)    Hepatitis B, chronic (HCC)    Hep BeAb+,Hep B cAb+ & Hep BsAg+ (9/06)   History of pyelonephritis    H/o GrpB Pyelonephritis (9/06) and UTI- 07/11- E.Coli, 12/10- GBS   Hyperlipidemia    Hypertension    Iron deficiency anemia    Irregular menses    Small ovarian follicles seen on QI(6/96)   MI (myocardial infarction) ([place]) 05/2006   PDA percutaneous transluminal coronary angioplasty   Migraine    "weekly" (04/22/2014)   N&V (nausea and vomiting)    Chronic. Unclear etiology with multiple admission and ED visits. CT abdomen with and without contrast (02/2011)  showed no acute process. Gastic Emptying scan (01/2010) was normal. Ultrasound of the abdomen was within normal limits. Hepatitis B viral load was undectable. HIV NR. EGD - gastritis, Hpylori + s/p Rx   New onset of congestive heart failure ([place]) 05/16/2022   Obesity    OSA (obstructive sleep apnea)    "suppose to wear mask but I  don't" (04/22/2014)   Peripheral neuropathy    Pneumonia    "this is probably the 2nd or 3rd time I've had pneumonia" (04/22/2014)   Recurrent boils    Seasonal asthma    Substance abuse ([place])    Thrombocytosis    Hem/Onc suggested 2/2 chronic hepatits and/or iron deficiency anemia    Past Surgical History:  Procedure Laterality Date   CESAREAN SECTION  1997   CORONARY ANGIOPLASTY WITH STENT PLACEMENT  2008   "2 stents"   ESOPHAGOGASTRODUODENOSCOPY N/A 04/23/2014   Procedure: ESOPHAGOGASTRODUODENOSCOPY (EGD);  Surgeon: Winfield Cunas., MD;  Location: [facility] ENDOSCOPY;  Service: Endoscopy;  Laterality: N/A;   INCISION AND DRAINAGE ABSCESS Right 04/17/2021   Procedure: INCISION AND DRAINAGE CHEST WALL ABSCESS;  Surgeon: Jesusita Oka, MD;  Location: [facility];  Service: General;  Laterality: Right;   IR ANGIOGRAM PELVIS SELECTIVE OR SUPRASELECTIVE  12/07/2016   IR ANGIOGRAM PELVIS SELECTIVE OR SUPRASELECTIVE  12/07/2016   IR ANGIOGRAM SELECTIVE EACH ADDITIONAL VESSEL  12/07/2016   IR ANGIOGRAM SELECTIVE EACH ADDITIONAL VESSEL  12/07/2016   IR EMBO ARTERIAL NOT HEMORR HEMANG INC GUIDE ROADMAPPING  12/07/2016   IR RADIOLOGIST EVAL & MGMT  01/09/2017   IR US GUIDE VASC ACCESS RIGHT  12/07/2016   IRRIGATION AND DEBRIDEMENT FOOT Right 04/07/2020   Procedure: Incision and Drainage Right Foot;  Surgeon: Evelina Bucy, DPM;  Location: [facility] ORS;  Service: Podiatry;  Laterality: Right;     reports that she quit smoking about 26 years ago. Her smoking use included cigarettes. She has never used smokeless tobacco. She reports that she does not currently use drugs after having used the following drugs: Marijuana and Cocaine. She reports that she does not drink alcohol.  Allergies  Allergen Reactions   Eggs Or Egg-Derived Products Hives, Itching and Nausea And Vomiting   Lisinopril Nausea Only and Swelling   Morphine And Related Itching and Swelling    Family History  Problem Relation Age of Onset   Diabetes Father    Healthy Mother      Prior to Admission medications   Medication Sig Start Date End Date Taking? Authorizing Provider  acetaminophen (TYLENOL) 500 MG tablet Take 1,000 mg by mouth every 6 (six) hours as needed for mild pain, moderate pain or headache.    [provider]  albuterol (VENTOLIN HFA) 108 (90 Base) MCG/ACT inhaler Inhale 2 puffs into the lungs every 4 (four) hours as needed for wheezing or shortness of breath. 11/30/19   Domenic Polite, MD  amLODipine (NORVASC) 10 MG tablet Take 1 tablet (10 mg  total) by mouth daily. 04/26/21   Aline August, MD  apixaban (ELIQUIS) 5 MG TABS tablet Take 1 tablet (5 mg total) by mouth 2 (two) times daily. Start when you finish the starter pack 04/19/22 06/18/22  Elodia Florence., MD  APIXABAN [facility]) VTE STARTER PACK ('10MG'$  AND '5MG'$ ) Take as directed on package: start with two-'5mg'$  tablets twice daily for 7 days. On day 8, switch to one-'5mg'$  tablet twice daily. 03/24/22   Elodia Florence., MD  aspirin 81 MG chewable tablet Chew 1 tablet (81 mg total) by mouth daily. 03/25/22 05/24/22  Elodia Florence., MD  atorvastatin (LIPITOR) 40 MG tablet Take 1 tablet (40 mg total) by mouth daily. 03/25/22 05/24/22  Elodia Florence., MD  carvedilol (COREG) 25 MG tablet Take 1 tablet (25 mg total) by mouth 2 (two) times daily with a meal. 03/24/22 05/23/22  Elodia Florence., MD  cyclobenzaprine (FLEXERIL) 10 MG tablet Take 10 mg by mouth 3 (three) times daily as needed for muscle spasms.    [provider]  dicyclomine (BENTYL) 10 MG capsule Take 1 capsule (10 mg total) by mouth 4 (four) times daily -  before meals and at bedtime. 08/20/21   Fransico Meadow, PA-C  diphenhydrAMINE (BENADRYL) 25 MG tablet Take 25 mg by mouth every 6 (six) hours as needed for allergies.    [provider]  famotidine (PEPCID) 20 MG tablet Take 20 mg by mouth every evening. 04/11/21   [provider]  gabapentin (NEURONTIN) 400 MG capsule Take 1 capsule (400 mg total) by mouth daily at 6 (six) AM. Patient taking differently: Take 400 mg by mouth See admin instructions. Take 400 mg by mouth one to three times a day as needed for neuropathic pain 04/26/21   Aline August, MD  glucose blood (ONETOUCH VERIO) test strip Use as instructed to check blood sugar 3 times daily. Dx codes: E11.8, E11.65 Jan 02, 2019   Charlott Rakes, MD  hydrALAZINE (APRESOLINE) 10 MG tablet Take 1 tablet (10 mg total) by mouth 3 (three) times daily. Follow up with your PCP for  titration of this medicine 03/24/22 04/23/22  Elodia Florence., MD  Insulin Pen Needle (PEN NEEDLES 3/16") 31G X 5 MM MISC 1 Device by Does not apply route 4 (four) times daily - after meals and at bedtime. 11/28/18   Kerin Perna, NP  INSULIN SYRINGE .5CC/28G (INS SYRINGE/NEEDLE .5CC/28G) 28G X 1/2" 0.5 ML MISC Please provide 1 month supply 09/04/17   Colbert Ewing, MD  linaclotide [facility]) 145 MCG CAPS capsule Take 145 mcg by mouth daily as needed (for constipation).    [provider]  meclizine (ANTIVERT) 25 MG tablet Take 1 tablet (25 mg total) by mouth 3 (three) times daily as needed for dizziness. 04/26/21   Aline August, MD  metoCLOPramide (REGLAN) 5 MG tablet Take 1 tablet (5 mg total) by mouth 3 (three) times daily for 14 days. Follow up with your PCP to determine if this needs to be continued 03/24/22 04/07/22  Elodia Florence., MD  [facility] 5 MG/0.5ML Pen Inject 5 mg into the skin every Sunday. 06/15/21   [provider]  MOUNJARO 7.5 MG/0.5ML Pen Inject 7.5 mg into the skin once a week. 04/20/22   [provider]  omeprazole (PRILOSEC) 40 MG capsule Take 40 mg by mouth at bedtime. 03/10/21   [provider]  ondansetron (ZOFRAN-ODT) 4 MG disintegrating tablet Take 4 mg by mouth daily as needed for nausea or vomiting (DISSOLVE ORALLY).    [provider]  OneTouch Delica Lancets 95K MISC Use to check blood sugar 3 times daily. Dx codes: E11.8, E11.65 2019/01/02   Charlott Rakes, MD  oxyCODONE-acetaminophen (PERCOCET) 10-325 MG tablet Take 1 tablet by mouth 3 (three) times daily as needed for pain.    [provider]  pantoprazole (PROTONIX) 40 MG tablet Take 40 mg by mouth 2 (two) times daily. 05/15/22   [provider]  promethazine (PHENERGAN) 25 MG tablet Take 25 mg by mouth every 6 (six) hours as needed for nausea or vomiting.    [provider]  pyridostigmine (MESTINON) 60 MG tablet Take by mouth.  05/15/22   [provider]  senna (SENOKOT) 8.6 MG TABS tablet Take 1 tablet (8.6 mg total) by mouth 2 (two) times daily. Patient taking differently: Take 1 tablet by  mouth 2 (two) times daily as needed for mild constipation. 04/26/21   Aline August, MD    Physical Exam: Vitals:   05/17/22 1300 05/17/22 1315 05/17/22 1400 05/17/22 1511  BP: (!) 178/114 (!) 171/104 (!) 141/104 (!) 160/102  Pulse: (!) 106 (!) 105 (!) 102 (!) 104  Resp: (!) '24 18 18 18  '$ Temp:    98.6 F (37 C)  TempSrc:    Oral  SpO2: 94% 100% 98% 99%  Weight:      Height:    '5\' 7"'$  (1.702 m)    Constitutional: NAD, calm, comfortable Vitals:   05/17/22 1300 05/17/22 1315 05/17/22 1400 05/17/22 1511  BP: (!) 178/114 (!) 171/104 (!) 141/104 (!) 160/102  Pulse: (!) 106 (!) 105 (!) 102 (!) 104  Resp: (!) '24 18 18 18  '$ Temp:    98.6 F (37 C)  TempSrc:    Oral  SpO2: 94% 100% 98% 99%  Weight:      Height:    '5\' 7"'$  (1.702 m)   Eyes: PERRL, lids and conjunctivae normal ENMT: Mucous membranes are moist. Posterior pharynx clear of any exudate or lesions.Normal dentition.  Neck: normal, supple, no masses, no thyromegaly Respiratory: clear to auscultation bilaterally, no wheezing, fine crackles on bilateral bases, increasing breathing effort. No accessory muscle use.  Cardiovascular: Regular rate and rhythm, no murmurs / rubs / gallops. 1+ extremity edema. 2+ pedal pulses. No carotid bruits.  Abdomen: no tenderness, no masses palpated. No hepatosplenomegaly. Bowel sounds positive.  Musculoskeletal: no clubbing / cyanosis. No joint deformity upper and lower extremities. Good ROM, no contractures. Normal muscle tone.  Skin: no rashes, lesions, ulcers. No induration Neurologic: CN 2-12 grossly intact. Sensation intact, DTR normal. Strength 5/5 in all 4.  Psychiatric: Normal judgment and insight. Alert and oriented x 3. Normal mood.    Labs on Admission: I have personally reviewed following labs and imaging  studies  CBC: Recent Labs  Lab 05/16/22 2004  WBC 10.3  HGB 11.0*  HCT 32.4*  MCV 75.9*  PLT 833   Basic Metabolic Panel: Recent Labs  Lab 05/16/22 2004  NA 135  K 3.9  CL 105  CO2 23  GLUCOSE 65*  BUN 29*  CREATININE 2.47*  CALCIUM 9.0   GFR: Estimated Creatinine Clearance: 33.3 mL/min (A) (by C-G formula based on SCr of 2.47 mg/dL (H)). Liver Function Tests: No results for input(s): "AST", "ALT", "ALKPHOS", "BILITOT", "PROT", "ALBUMIN" in the last 168 hours. No results for input(s): "LIPASE", "AMYLASE" in the last 168 hours. No results for input(s): "AMMONIA" in the last 168 hours. Coagulation Profile: No results for input(s): "INR", "PROTIME" in the last 168 hours. Cardiac Enzymes: No results for input(s): "CKTOTAL", "CKMB", "CKMBINDEX", "TROPONINI" in the last 168 hours. BNP (last 3 results) No results for input(s): "PROBNP" in the last 8760 hours. HbA1C: No results for input(s): "HGBA1C" in the last 72 hours. CBG: Recent Labs  Lab 05/17/22 0053 05/17/22 0813 05/17/22 1212 05/17/22 1318 05/17/22 1418  GLUCAP 90 81 71 73 64*   Lipid Profile: No results for input(s): "CHOL", "HDL", "LDLCALC", "TRIG", "CHOLHDL", "LDLDIRECT" in the last 72 hours. Thyroid Function Tests: No results for input(s): "TSH", "T4TOTAL", "FREET4", "T3FREE", "THYROIDAB" in the last 72 hours. Anemia Panel: No results for input(s): "VITAMINB12", "FOLATE", "FERRITIN", "TIBC", "IRON", "RETICCTPCT" in the last 72 hours. Urine analysis:    Component Value Date/Time   COLORURINE AMBER (A) 08/20/2021 0235   APPEARANCEUR CLOUDY (A) 08/20/2021 0235   LABSPEC 1.029 08/20/2021  JAARS 5.0 08/20/2021 0235   GLUCOSEU NEGATIVE 08/20/2021 0235   GLUCOSEU > 1000 mg/dL (A) 07/07/2008 2115   HGBUR NEGATIVE 08/20/2021 0235   HGBUR negative 08/12/2008 1444   BILIRUBINUR NEGATIVE 08/20/2021 0235   BILIRUBINUR neg 02/07/2017 [place] 08/20/2021 0235   PROTEINUR >=300 (A)  08/20/2021 0235   UROBILINOGEN 1.0 02/07/2017 1609   UROBILINOGEN 0.2 11/11/2014 1658   NITRITE NEGATIVE 08/20/2021 0235   LEUKOCYTESUR NEGATIVE 08/20/2021 0235    Radiological Exams on Admission: DG Chest 2 View  Result Date: 05/16/2022 CLINICAL DATA:  Chest pain and shortness of breath EXAM: CHEST - 2 VIEW COMPARISON:  03/20/2022 and older FINDINGS: Enlarged cardiopericardial silhouette with vascular congestion. No consolidation or pneumothorax. Question tiny left effusion. IMPRESSION: Enlarged cardiopericardial silhouette with vascular congestion. Tiny left effusion Electronically Signed   By: Jill Side M.D.   On: 05/16/2022 18:28    EKG: Independently reviewed. Sinus, no acute ST changes.  Assessment/Plan Principal Problem:   New onset of congestive heart failure (HCC) Active Problems:   Bilateral pulmonary embolism (HCC)   Acute DVT (deep venous thrombosis) (HCC)   Chest pain   CAD S/P percutaneous coronary angioplasty   CHF (congestive heart failure) ([place])  (please populate well all problems here in Problem List. (For example, if patient is on BP meds at home and you resume or decide to hold them, it is a problem that needs to be her. Same for CAD, COPD, HLD and so on)  Acute on chronic combined HFrEF and HFpEF decompensation -Probably related to uncontrolled HTN -She is already on amlodipine and Coreg maximum dosage, will start hydralazine and Imdur regimen and adjust dosage according to response. -Echo was done about 2 months ago, will not repeat at this point. -Daily weight and INO's. -Still appears to be fluid overloaded, will give 1 dose of IV Lasix this afternoon, and may switch to as needed Lasix from tomorrow and on discharge. -Chest xray in AM, judicious use of Lasix  Chest pain -Pleural, might related to dry cough form CHF decompensation.  ACS given the troponin trending is flat. -Recommend outpatient stress test -Other Ddx, patient has a history of recent  bilateral PE with no right heart strain, and has been compliant with Eliquis, will not repeat PE study at this point.  Hx of CAD and stenting -As discussed above -Continue aspirin and statin  HTN, uncontrolled -As above  PE and DVT -Continue Eliquis  CKD stage IIIb -Creatinine level stable, -Volume overloaded, 1 dose of Lasix, repeat kidney function tomorrow  IIDM A1C=5.8, sliding scale for now -On home Mounjaro  OSA -CPAP HS    DVT prophylaxis: Eliquis Code Status: Full code Family Communication: None at bedside Disposition Plan: Expect less than 2 midnight hospital stay Consults called: None Admission status: Tele observation   Lequita Halt MD [facility] Pager [phone]  05/17/2022, 5:09 PM

## 2022-05-17 NOTE — ED Provider Notes (Signed)
Patient has had multiple episodes of possible wide complex tachycardia on monitor, however she is moving in bed quite a bit. States she is having severe chest tightness. Will repeat EKG. Pain meds ordered, will try to correlate her monitor abnormalities with movement in bed.    Truddie Hidden, MD 05/17/22 [phone]

## 2022-05-17 NOTE — ED Notes (Signed)
Monitor alarming for abnormal rhythm, rhythm strip captured and taken to EDP [place].

## 2022-05-17 NOTE — ED Notes (Signed)
Provider notifie  of CBG, will continue to montior

## 2022-05-17 NOTE — ED Notes (Signed)
Provider apprised of Pt of pain and current BP, Coreg just given

## 2022-05-17 NOTE — ED Notes (Signed)
Report given to [facility]. 

## 2022-05-17 NOTE — ED Notes (Signed)
CBG 81.  

## 2022-05-17 NOTE — ED Notes (Addendum)
Pt resting on visual check . Normal rise and fall of chest. 100%. Vitals baseline for this pt

## 2022-05-17 NOTE — ED Notes (Signed)
George at [facility] called for transport to [facility].-ABB(NS)

## 2022-05-17 NOTE — Progress Notes (Signed)
Pt has OSA

## 2022-05-18 ENCOUNTER — Observation Stay (HOSPITAL_COMMUNITY): Payer: Medicare HMO

## 2022-05-18 ENCOUNTER — Encounter (HOSPITAL_COMMUNITY): Payer: Self-pay | Admitting: Internal Medicine

## 2022-05-18 DIAGNOSIS — I5023 Acute on chronic systolic (congestive) heart failure: Secondary | ICD-10-CM | POA: Diagnosis not present

## 2022-05-18 DIAGNOSIS — E785 Hyperlipidemia, unspecified: Secondary | ICD-10-CM

## 2022-05-18 DIAGNOSIS — N189 Chronic kidney disease, unspecified: Secondary | ICD-10-CM | POA: Diagnosis present

## 2022-05-18 DIAGNOSIS — I2510 Atherosclerotic heart disease of native coronary artery without angina pectoris: Secondary | ICD-10-CM | POA: Diagnosis not present

## 2022-05-18 DIAGNOSIS — N179 Acute kidney failure, unspecified: Secondary | ICD-10-CM | POA: Diagnosis present

## 2022-05-18 DIAGNOSIS — N1832 Chronic kidney disease, stage 3b: Secondary | ICD-10-CM | POA: Diagnosis not present

## 2022-05-18 DIAGNOSIS — I10 Essential (primary) hypertension: Secondary | ICD-10-CM

## 2022-05-18 DIAGNOSIS — E1169 Type 2 diabetes mellitus with other specified complication: Secondary | ICD-10-CM

## 2022-05-18 DIAGNOSIS — Z86711 Personal history of pulmonary embolism: Secondary | ICD-10-CM

## 2022-05-18 DIAGNOSIS — Z9861 Coronary angioplasty status: Secondary | ICD-10-CM

## 2022-05-18 LAB — GLUCOSE, CAPILLARY
Glucose-Capillary: 91 mg/dL (ref 70–99)
Glucose-Capillary: 104 mg/dL — ABNORMAL HIGH (ref 70–99)
Glucose-Capillary: 104 mg/dL — ABNORMAL HIGH (ref 70–99)
Glucose-Capillary: 110 mg/dL — ABNORMAL HIGH (ref 70–99)
Glucose-Capillary: 104 mg/dL — ABNORMAL HIGH (ref 70–99)

## 2022-05-18 LAB — BASIC METABOLIC PANEL
Anion gap: 6 (ref 5–15)
BUN: 27 mg/dL — ABNORMAL HIGH (ref 6–20)
CO2: 23 mmol/L (ref 22–32)
Calcium: 8.4 mg/dL — ABNORMAL LOW (ref 8.9–10.3)
Chloride: 109 mmol/L (ref 98–111)
Creatinine, Ser: 2.37 mg/dL — ABNORMAL HIGH (ref 0.44–1.00)
GFR, Estimated: 25 mL/min — ABNORMAL LOW (ref >60)
Glucose, Bld: 107 mg/dL — ABNORMAL HIGH (ref 70–99)
Potassium: 3.8 mmol/L (ref 3.5–5.1)
Sodium: 138 mmol/L (ref 135–145)

## 2022-05-18 LAB — MRSA NEXT GEN BY PCR, NASAL: MRSA by PCR Next Gen: DETECTED — AB

## 2022-05-18 MED ORDER — OXYCODONE HCL 5 MG PO TABS: 5.0000 mg | ORAL_TABLET | Freq: Four times a day (QID) | ORAL | Status: DC | PRN

## 2022-05-18 MED ORDER — OXYCODONE-ACETAMINOPHEN 5-325 MG PO TABS
1.0000 | ORAL_TABLET | Freq: Three times a day (TID) | ORAL | Status: DC | PRN
  Administered 2022-05-18 – 2022-05-23 (×12): 1 via ORAL
  Filled 2022-05-18 (×12): qty 1

## 2022-05-18 MED ORDER — FUROSEMIDE 10 MG/ML IJ SOLN
40.0000 mg | Freq: Once | INTRAMUSCULAR | Status: AC
  Administered 2022-05-18: 40 mg via INTRAVENOUS
  Filled 2022-05-18: qty 4

## 2022-05-18 NOTE — Progress Notes (Signed)
Heart Failure Nurse Navigator Progress Note  PCP: Selena Batten, NP PCP-Cardiologist: [place] Admission Diagnosis: Shortness of breath, chest pain, Bilateral lower extremity edema, hypertension.  Admitted from: PCP to ED  Presentation:   Shirley [name] presented with shortness of breath and 2+ bilateral leg edema, chest discomfort was recently admitted a month ago with a PE and DVT ( on Eliquis). BP 196/143, HR 117, BMI 33.52, BNP 131.7, Troponin 48, CXR with enlarged cardio pericardial silhouette with pulmonary vascular congestion with small left pleural effusion.   Patient was educated on the sing and symptoms of heart failure, daily weights, when to call her doctor or go to the ED, a scale was brought to bedside for patient , education on diet/ fluid restrictions, taking all her medications as prescribed, and attending all medical appointments, Patient asked about getting a BP cuff and Glucose testing meter thru her insurance. Navigator reached out to CSW with regards to items and insurance coverage. Patient verbalized her understanding of education, a hospital follow up with Hf TOC was scheduled for 05/31/2022 @ 3 pm.   ECHO/ LVEF: 45-50%   Clinical Course:  Past Medical History:  Diagnosis Date   Abscess of tunica vaginalis    10/09- Abundant S. aureus- sensitive to all abx   Anxiety    Blood dyscrasia    CAD (coronary artery disease) 06/15/2006   s/p Subendocardial MI with PDA angioplasty(no stent) on 06/15/06 and relook  cath 06/19/06 showed patency of site. Cath 12/10- no restenosis or significant CAD progression   CVA (cerebral vascular accident) ([place]) ~ 02/2014   denies residual on 04/22/2014   CVA (cerebral vascular accident) [facility])    history of remote right cerebellar infarct noted on head CT at least since 10/2011   Depression    Diabetes mellitus type 2, uncontrolled, with complications    Fibromyalgia    Gastritis    Gastroparesis    secondary to poorly controlled DM,  last emptying study performed 01/2010  was normal but may be falsely positive as pt was on reglan   GERD (gastroesophageal reflux disease)    Hepatitis B, chronic (HCC)    Hep BeAb+,Hep B cAb+ & Hep BsAg+ (9/06)   History of pyelonephritis    H/o GrpB Pyelonephritis (9/06) and UTI- 07/11- E.Coli, 12/10- GBS   Hyperlipidemia    Hypertension    Iron deficiency anemia    Irregular menses    Small ovarian follicles seen on YK(9/98)   MI (myocardial infarction) ([place]) 05/2006   PDA percutaneous transluminal coronary angioplasty   Migraine    "weekly" (04/22/2014)   N&V (nausea and vomiting)    Chronic. Unclear etiology with multiple admission and ED visits. CT abdomen with and without contrast (02/2011)  showed no acute process. Gastic Emptying scan (01/2010) was normal. Ultrasound of the abdomen was within normal limits. Hepatitis B viral load was undectable. HIV NR. EGD - gastritis, Hpylori + s/p Rx   New onset of congestive heart failure ([place]) 05/16/2022   Obesity    OSA (obstructive sleep apnea)    "suppose to wear mask but I don't" (04/22/2014)   Peripheral neuropathy    Pneumonia    "this is probably the 2nd or 3rd time I've had pneumonia" (04/22/2014)   Recurrent boils    Seasonal asthma    Substance abuse ([place])    Thrombocytosis    Hem/Onc suggested 2/2 chronic hepatits and/or iron deficiency anemia     Social History   Socioeconomic History  Marital status: Single    Spouse name: Not on file   Number of children: 2   Years of education: 61   Highest education level: Not on file  Occupational History   Occupation: other    Comment: unemployed    Comment: disability  Tobacco Use   Smoking status: Former    Types: Cigarettes    Quit date: 04/24/1996    Years since quitting: 26.0   Smokeless tobacco: Never   Tobacco comments:    quit smoking cigarettes age 62  Vaping Use   Vaping Use: Never used  Substance and Sexual Activity   Alcohol use: No    Alcohol/week: 0.0  standard drinks of alcohol    Comment: 04/22/2014 "might have a few drinks a month"   Drug use: Not Currently    Types: Marijuana, Cocaine    Comment: 04/22/2104 "quit drugs ~ 1-2 yr ago"   Sexual activity: Yes    Birth control/protection: None  Other Topics Concern   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   Also works at [employer] having to lift elderly individuals.         Social Determinants of Health   Financial Resource Strain: Low Risk  (05/18/2022)   Overall Financial Resource Strain (CARDIA)    Difficulty of Paying Living Expenses: Not very hard  Food Insecurity: No Food Insecurity (05/18/2022)   Hunger Vital Sign    Worried About Running Out of Food in the Last Year: Never true    Ran Out of Food in the Last Year: Never true  Transportation Needs: No Transportation Needs (05/18/2022)   PRAPARE - Hydrologist (Medical): No    Lack of Transportation (Non-Medical): No  Physical Activity: Not on file  Stress: Not on file  Social Connections: Not on file   Education Assessment and Provision:  Detailed education and instructions provided on heart failure disease management including the following:  Signs and symptoms of Heart Failure When to call the physician Importance of daily weights Low sodium diet Fluid restriction Medication management Anticipated future follow-up appointments  Patient education given on each of the above topics.  Patient acknowledges understanding via teach back method and acceptance of all instructions.  Education Materials:  "Living Better With Heart Failure" Booklet, HF zone tool, & Daily Weight Tracker Tool.  Patient has scale at home: No, brought one to bedside for patient 05/18/22.  Patient has pill box at home: NA     High Risk Criteria for Readmission and/or Poor Patient Outcomes: Heart failure hospital admissions  (last 6 months): 0  No Show rate: 28% Difficult social situation: No Demonstrates medication adherence: yes Primary Language: English Literacy level: Reading, writing, and comprehension  Barriers of Care:   HF education Diet/ fluid/ daily weight compliance   Considerations/Referrals:   Referral made to Heart Failure Pharmacist Stewardship: yes Referral made to Heart Failure CSW/NCM TOC: Yes, for a BP cuff and CBG machine Referral made to Heart & Vascular TOC clinic: Yes, 05/31/2022 @ 3 pm  Items for Follow-up on DC/TOC: Diet/ fluid/ daily weights  Continued Hf education   Earnestine Leys, BSN, RN Heart Failure Transport planner Only

## 2022-05-18 NOTE — Hospital Course (Addendum)
Shirley [name] was admitted to the hospital with the working diagnosis of decompensated heart failure.   47 yo female with the past medical history of heart failure, CKD, coronary artery disease, CVA, T2DM, DVT and bilateral PE who presented with dyspnea, chest pain and lower extremity edema. Reported one week of dyspnea and chest pressure, along with bilaterl lower extremity edema. She has been not compliant with her medications. On her initial physical examination her blood pressure was 178/114, HR 106, RR 24 and 02 saturation 94%, lungs with bilateral rales with increase work of breathing, heart with S1 and S2 present and rhythmic, abdomen with no distention, positive lower extremity edema.  Na 135, K 3,9 CL 105 bicarbonate 23, glucose 65, bun 28 cr 2,47  BNP 131.7  High sensitive troponin 48 and 44  Wbc 10,3 hgb 11.0 plt 382   Sars covid 19 negative Influenza A and B negative  Toxicology negative   Chest radiograph with cardiomegaly with bilateral hilar vascular congestion and some cephalization of the vasculature, small left pleural effusion.   EKG 108 bpm, normal axis, normal intervals, sinus rhythm with poor  R R wave progression, with no significant ST segment or T wave changes.   Patient was placed on furosemide for diuresis.   01/26 volume status has improved. Pending improvement in renal function.  01/27 renal function still not back at baseline.  01/28 worsening renal failure and edema.  01/29 volume improving, possible new baseline serum cr at 3. Will consult nephrology.

## 2022-05-18 NOTE — Plan of Care (Signed)
  Problem: Activity: Goal: Risk for activity intolerance will decrease Outcome: Progressing   Problem: Coping: Goal: Level of anxiety will decrease Outcome: Progressing   Problem: Safety: Goal: Ability to remain free from injury will improve Outcome: Progressing

## 2022-05-18 NOTE — Assessment & Plan Note (Addendum)
Volume status has improved. Renal function with serum cr at 2,79 with K at 3,9 and serum bicarbonate at 22.  Mg 1,8 and NA 136.   Plan to continue close monitoring renal function and electrolytes. Hold on diuretic therapy for now.  Avoid hypotension or nephrotoxic medications.

## 2022-05-18 NOTE — Progress Notes (Signed)
   Heart Failure Stewardship Pharmacist Progress Note   PCP: Selena Batten, NP PCP-Cardiologist: Mertie Moores, MD    HPI:  47 yo F with PMH of PE/DVT, CAD, T2DM, HLD, CKD, obesity, polysubstance use, OSA, diabetic gastroparesis, and DKA.  Admitted from 03/20/22 to 03/24/22 with acute PE and DVT now taking Eliquis. ECHO at that time showed LVEF 45-50%, mild concentric LVH, RV normal.   Presented to the ED on 1/23 with shortness of breath, weight gain, orthopnea, chest discomfort, and LE edema. CXR with stable cardiomegaly and mild central pulmonary vascular congestion.   Current HF Medications: Beta Blocker: carvedilol 25 mg BID Other: hydralazine 25 mg TID + Imdur 30 mg daily  Prior to admission HF Medications: Beta blocker: carvedilol 25 mg BID Other: hydralazine 10 mg TID  Pertinent Lab Values: Serum creatinine 2.37, BUN 27, Potassium 3.8, Sodium 138, BNP 131.7, A1c 5.8   Vital Signs: Weight: 213 lbs (admission weight: 213 lbs) Blood pressure: 140/90s  Heart rate: 90-100s  I/O: net -0.4L  Medication Assistance / Insurance Benefits Check: Does the patient have prescription insurance?  Yes Type of insurance plan: [place] Medicaid  Outpatient Pharmacy:  Prior to admission outpatient pharmacy: [facility] Is the patient willing to use [place] at discharge? Yes Is the patient willing to transition their outpatient pharmacy to utilize a [facility] outpatient pharmacy?   Pending    Assessment: 1. 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 NYHA class III symptoms. - Off all lasix currently. Consider redosing IV lasix vs start oral - Continue carvedilol 25 mg BID  - GDMT limited by CKD - Caution adding SGLT2i with history of DKA   Plan: 1) Medication changes recommended at this time: - Restart lasix  2) Patient assistance: - None needed, has [place] Medicaid  3)  Education  - To be completed prior to discharge  Kerby Nora, PharmD, BCPS Heart Failure Forensic scientist Phone [phone]

## 2022-05-18 NOTE — Assessment & Plan Note (Signed)
No chest pain, troponin elevation due to heart failure decompensation. ACS ruled out.

## 2022-05-18 NOTE — Assessment & Plan Note (Addendum)
Glucose has been stable, off insulin therapy. Will order home glucometer.  Dyslipidemia   Gastroparesis, add zofran and compazine for symptom control.   Continue with statin therapy.

## 2022-05-18 NOTE — Assessment & Plan Note (Addendum)
Blood pressure control with carvedilol, hydralazine and isosorbide,

## 2022-05-18 NOTE — Assessment & Plan Note (Signed)
Hx of DVT.  Continue anticoagulation with apixaban.

## 2022-05-18 NOTE — TOC Progression Note (Signed)
Transition of Care [facility]) - Progression Note    Patient Details  Name: Shirley [name] MRN: [id number] Date of Birth: 08-18-1975  Transition of Care [facility]) CM/SW Contact  Zenon Mayo, RN Phone Number: 05/18/2022, 7:31 AM  Clinical Narrative:    From home,  presents with New onset of congestive heart failure,Bilateral pulmonary embolism ,Acute DVT,Chest pain and CHF . TOC following.         Expected Discharge Plan and Services                                               Social Determinants of Health (SDOH) Interventions [place]: No Food Insecurity (03/22/2022)  Housing: Low Risk  (03/22/2022)  Transportation Needs: No Transportation Needs (03/22/2022)  Utilities: Not At Risk (03/22/2022)  Depression (PHQ2-9): Medium Risk (11/28/2018)  Tobacco Use: Medium Risk (05/16/2022)    Readmission Risk Interventions    03/24/2022    3:38 PM  Readmission Risk Prevention Plan  Transportation Screening Complete  Medication Review (RN Care Manager) Referral to Pharmacy  [place] or [place] Complete  SW Recovery Care/Counseling Consult Complete  [place] Not Applicable

## 2022-05-18 NOTE — Progress Notes (Signed)
Progress Note   Patient: Shirley [name] OXB:[id number] DOB: Aug 13, 1975 DOA: 05/16/2022     0 DOS: the patient was seen and examined on 05/18/2022   Brief hospital course: Shirley [name] was admitted to the hospital with the working diagnosis of decompensated heart failure.   47 yo female with the past medical history of heart failure, CKD, coronary artery disease, CVA, T2DM, DVT and bilateral PE who presented with dyspnea, chest pain and lower extremity edema. Reported one week of dyspnea and chest pressure, along with bilaterl lower extremity edema. She has been not compliant with her medications. On her initial physical examination her blood pressure was 178/114, HR 106, RR 24 and 02 saturation 94%, lungs with bilateral rales with increase work of breathing, heart with S1 and S2 present and rhythmic, abdomen with no distention, positive lower extremity edema.  Na 135, K 3,9 CL 105 bicarbonate 23, glucose 65, bun 28 cr 2,47  BNP 131.7  High sensitive troponin 48 and 44  Wbc 10,3 hgb 11.0 plt 382   Sars covid 19 negative Influenza A and B negative  Toxicology negative   Chest radiograph with cardiomegaly with bilateral hilar vascular congestion and some cephalization of the vasculature, small left pleural effusion.   EKG 108 bpm, normal axis, normal intervals, sinus rhythm with poor  R R wave progression, with no significant ST segment or T wave changes.   Patient was placed on furosemide for diuresis.   Assessment and Plan: Acute on chronic systolic CHF (congestive heart failure) (HCC) Echocardiogram with mild reduction in LV systolic function, EF 45 to 50%, global hypokinesis, mild LVH, RV systolic function preserved, RVSP 35,9 LA with moderate dilatation, small pericardial effusion. Mild to moderate MR and TR.   Documented urine output is 683 cc Systolic blood pressure is 138 and 140 mmHg.   Continue blood pressure control with amlodipine and carvedilol.  One  dose of IV furosemide  today, 40 mg.  After load reduction with hydralazine and isosorbide.  Limited pharmacologic options due to reduced GFR.   CAD S/P percutaneous coronary angioplasty No chest pain, troponin elevation due to heart failure decompensation. ACS ruled out.    Stage 3b chronic kidney disease (CKD) (HCC) Renal function with serum cr at 2,37 with K at 3,8 and serum bicarbonate at 23,  Follow up renal function in am. One dose of furosemide today,   Severe hypertension Blood pressure has improved, continue carvedilol, hydralazine and isosorbide,   Type 2 diabetes mellitus with hyperlipidemia (HCC) Fasting glucose is 107. Glucose has been stable, will discontinue insulin therapy for now.   Dyslipidemia   History of pulmonary embolism Hx of DVT.  Continue anticoagulation with apixaban.         Subjective: Patient with improvement in her dyspnea and edema, no chest pain, mild abdominal pain,   Physical Exam: Vitals:   05/18/22 0300 05/18/22 0500 05/18/22 0718 05/18/22 1119  BP: (!) 160/95  134/83 (!) 140/90  Pulse: 98  99 97  Resp: '18  20 18  '$ Temp: 98.7 F (37.1 C)  98.5 F (36.9 C)   TempSrc: Oral  Oral   SpO2: 97%  97% 95%  Weight:  97 kg    Height:       Neurology awake and alert ENT with mild pallor Cardiovascular with S1 and S2 present and rhythmic with no gallops, rubs or murmurs Respiratory with mild rales with no wheezing, no rhonchi Abdomen with no distention Trace lower extremity edema Data Reviewed:  Family Communication: no family at the bedside   Disposition: Status is: Observation The patient remains OBS appropriate and will d/c before 2 midnights.  Planned Discharge Destination: Home     Author: Tawni Millers, MD 05/18/2022 5:38 PM  For on call review [email or web address].

## 2022-05-18 NOTE — Assessment & Plan Note (Signed)
>>  ASSESSMENT AND PLAN FOR ACUTE ON CHRONIC SYSTOLIC CHF (CONGESTIVE HEART FAILURE) (HCC) WRITTEN ON 05/23/2022 10:07 AM BY Coralie Keens, MD  Echocardiogram with mild reduction in LV systolic function, EF 45 to 50%, global hypokinesis, mild LVH, RV systolic function preserved, RVSP 35,9 LA with moderate dilatation, small pericardial effusion. Mild to moderate MR and TR.   Pulmonary hypertension Acute on chronic core pulmonale.   Continue blood pressure control with amlodipine and carvedilol.  After load reduction with hydralazine and isosorbide.  Continue loop diuretic 60 mg po daily.    Limited pharmacologic options due to reduced GFR.

## 2022-05-18 NOTE — Assessment & Plan Note (Addendum)
Echocardiogram with mild reduction in LV systolic function, EF 45 to 50%, global hypokinesis, mild LVH, RV systolic function preserved, RVSP 35,9 LA with moderate dilatation, small pericardial effusion. Mild to moderate MR and TR.   Urine output is 9,249 ml Systolic blood pressure 324 to 153 mmHg.   Continue blood pressure control with amlodipine and carvedilol.  After load reduction with hydralazine and isosorbide.  Resume diuresis with furosemide IV 60 mg.   Limited pharmacologic options due to reduced GFR.

## 2022-05-18 NOTE — Assessment & Plan Note (Signed)
>>  ASSESSMENT AND PLAN FOR NEW ONSET OF CONGESTIVE HEART FAILURE (HCC) WRITTEN ON 09/07/2022  9:42 PM BY Mychal Durio T, PA-C  >>ASSESSMENT AND PLAN FOR ACUTE ON CHRONIC SYSTOLIC CHF (CONGESTIVE HEART FAILURE) (HCC) WRITTEN ON 05/23/2022 10:07 AM BY Coralie Keens, MD  Echocardiogram with mild reduction in LV systolic function, EF 45 to 50%, global hypokinesis, mild LVH, RV systolic function preserved, RVSP 35,9 LA with moderate dilatation, small pericardial effusion. Mild to moderate MR and TR.   Pulmonary hypertension Acute on chronic core pulmonale.   Continue blood pressure control with amlodipine and carvedilol.  After load reduction with hydralazine and isosorbide.  Continue loop diuretic 60 mg po daily.    Limited pharmacologic options due to reduced GFR.

## 2022-05-19 DIAGNOSIS — I130 Hypertensive heart and chronic kidney disease with heart failure and stage 1 through stage 4 chronic kidney disease, or unspecified chronic kidney disease: Secondary | ICD-10-CM | POA: Diagnosis present

## 2022-05-19 DIAGNOSIS — E669 Obesity, unspecified: Secondary | ICD-10-CM | POA: Diagnosis present

## 2022-05-19 DIAGNOSIS — I10 Essential (primary) hypertension: Secondary | ICD-10-CM | POA: Diagnosis not present

## 2022-05-19 DIAGNOSIS — G4733 Obstructive sleep apnea (adult) (pediatric): Secondary | ICD-10-CM | POA: Diagnosis present

## 2022-05-19 DIAGNOSIS — N179 Acute kidney failure, unspecified: Secondary | ICD-10-CM | POA: Diagnosis not present

## 2022-05-19 DIAGNOSIS — Z1152 Encounter for screening for COVID-19: Secondary | ICD-10-CM | POA: Diagnosis not present

## 2022-05-19 DIAGNOSIS — N184 Chronic kidney disease, stage 4 (severe): Secondary | ICD-10-CM | POA: Diagnosis present

## 2022-05-19 DIAGNOSIS — I509 Heart failure, unspecified: Secondary | ICD-10-CM | POA: Insufficient documentation

## 2022-05-19 DIAGNOSIS — N1832 Chronic kidney disease, stage 3b: Secondary | ICD-10-CM | POA: Diagnosis not present

## 2022-05-19 DIAGNOSIS — I3139 Other pericardial effusion (noninflammatory): Secondary | ICD-10-CM | POA: Diagnosis present

## 2022-05-19 DIAGNOSIS — I5043 Acute on chronic combined systolic (congestive) and diastolic (congestive) heart failure: Secondary | ICD-10-CM | POA: Diagnosis present

## 2022-05-19 DIAGNOSIS — K3184 Gastroparesis: Secondary | ICD-10-CM | POA: Diagnosis present

## 2022-05-19 DIAGNOSIS — I252 Old myocardial infarction: Secondary | ICD-10-CM | POA: Diagnosis not present

## 2022-05-19 DIAGNOSIS — I2510 Atherosclerotic heart disease of native coronary artery without angina pectoris: Secondary | ICD-10-CM | POA: Diagnosis not present

## 2022-05-19 DIAGNOSIS — K219 Gastro-esophageal reflux disease without esophagitis: Secondary | ICD-10-CM | POA: Diagnosis present

## 2022-05-19 DIAGNOSIS — E1169 Type 2 diabetes mellitus with other specified complication: Secondary | ICD-10-CM | POA: Diagnosis present

## 2022-05-19 DIAGNOSIS — Z794 Long term (current) use of insulin: Secondary | ICD-10-CM | POA: Diagnosis not present

## 2022-05-19 DIAGNOSIS — I5023 Acute on chronic systolic (congestive) heart failure: Secondary | ICD-10-CM | POA: Diagnosis not present

## 2022-05-19 DIAGNOSIS — E785 Hyperlipidemia, unspecified: Secondary | ICD-10-CM | POA: Diagnosis present

## 2022-05-19 DIAGNOSIS — I272 Pulmonary hypertension, unspecified: Secondary | ICD-10-CM | POA: Diagnosis present

## 2022-05-19 DIAGNOSIS — E1143 Type 2 diabetes mellitus with diabetic autonomic (poly)neuropathy: Secondary | ICD-10-CM | POA: Diagnosis present

## 2022-05-19 DIAGNOSIS — I2781 Cor pulmonale (chronic): Secondary | ICD-10-CM | POA: Diagnosis present

## 2022-05-19 DIAGNOSIS — E1165 Type 2 diabetes mellitus with hyperglycemia: Secondary | ICD-10-CM | POA: Diagnosis not present

## 2022-05-19 DIAGNOSIS — Z7901 Long term (current) use of anticoagulants: Secondary | ICD-10-CM | POA: Diagnosis not present

## 2022-05-19 DIAGNOSIS — J4489 Other specified chronic obstructive pulmonary disease: Secondary | ICD-10-CM | POA: Diagnosis present

## 2022-05-19 DIAGNOSIS — M797 Fibromyalgia: Secondary | ICD-10-CM | POA: Diagnosis present

## 2022-05-19 DIAGNOSIS — B181 Chronic viral hepatitis B without delta-agent: Secondary | ICD-10-CM | POA: Diagnosis present

## 2022-05-19 DIAGNOSIS — E1122 Type 2 diabetes mellitus with diabetic chronic kidney disease: Secondary | ICD-10-CM | POA: Diagnosis present

## 2022-05-19 DIAGNOSIS — R0602 Shortness of breath: Secondary | ICD-10-CM | POA: Diagnosis present

## 2022-05-19 LAB — BASIC METABOLIC PANEL
Anion gap: 5 (ref 5–15)
BUN: 30 mg/dL — ABNORMAL HIGH (ref 6–20)
CO2: 22 mmol/L (ref 22–32)
Calcium: 8.2 mg/dL — ABNORMAL LOW (ref 8.9–10.3)
Chloride: 109 mmol/L (ref 98–111)
Creatinine, Ser: 2.79 mg/dL — ABNORMAL HIGH (ref 0.44–1.00)
GFR, Estimated: 20 mL/min — ABNORMAL LOW (ref >60)
Glucose, Bld: 99 mg/dL (ref 70–99)
Potassium: 3.9 mmol/L (ref 3.5–5.1)
Sodium: 136 mmol/L (ref 135–145)

## 2022-05-19 LAB — MAGNESIUM: Magnesium: 1.8 mg/dL (ref 1.7–2.4)

## 2022-05-19 LAB — GLUCOSE, CAPILLARY: Glucose-Capillary: 112 mg/dL — ABNORMAL HIGH (ref 70–99)

## 2022-05-19 MED ORDER — MUPIROCIN 2 % EX OINT
1.0000 | TOPICAL_OINTMENT | Freq: Two times a day (BID) | CUTANEOUS | Status: DC
  Administered 2022-05-19 – 2022-05-23 (×9): 1 via NASAL
  Filled 2022-05-19 (×2): qty 22

## 2022-05-19 MED ORDER — PANTOPRAZOLE SODIUM 40 MG PO TBEC
40.0000 mg | DELAYED_RELEASE_TABLET | Freq: Two times a day (BID) | ORAL | Status: DC
  Administered 2022-05-19 – 2022-05-23 (×8): 40 mg via ORAL
  Filled 2022-05-19 (×8): qty 1

## 2022-05-19 MED ORDER — SUCRALFATE 1 GM/10ML PO SUSP: 1.0000 g | Freq: Three times a day (TID) | ORAL | Status: DC

## 2022-05-19 MED ORDER — CHLORHEXIDINE GLUCONATE CLOTH 2 % EX PADS
6.0000 | MEDICATED_PAD | Freq: Every day | CUTANEOUS | Status: DC
  Administered 2022-05-19 – 2022-05-21 (×3): 6 via TOPICAL

## 2022-05-19 NOTE — Progress Notes (Signed)
Progress Note   Patient: Shirley [name] GLO:[id number] DOB: August 16, 1975 DOA: 05/16/2022     0 DOS: the patient was seen and examined on 05/19/2022   Brief hospital course: Mrs. Eischeid was admitted to the hospital with the working diagnosis of decompensated heart failure.   47 yo female with the past medical history of heart failure, CKD, coronary artery disease, CVA, T2DM, DVT and bilateral PE who presented with dyspnea, chest pain and lower extremity edema. Reported one week of dyspnea and chest pressure, along with bilaterl lower extremity edema. She has been not compliant with her medications. On her initial physical examination her blood pressure was 178/114, HR 106, RR 24 and 02 saturation 94%, lungs with bilateral rales with increase work of breathing, heart with S1 and S2 present and rhythmic, abdomen with no distention, positive lower extremity edema.  Na 135, K 3,9 CL 105 bicarbonate 23, glucose 65, bun 28 cr 2,47  BNP 131.7  High sensitive troponin 48 and 44  Wbc 10,3 hgb 11.0 plt 382   Sars covid 19 negative Influenza A and B negative  Toxicology negative   Chest radiograph with cardiomegaly with bilateral hilar vascular congestion and some cephalization of the vasculature, small left pleural effusion.   EKG 108 bpm, normal axis, normal intervals, sinus rhythm with poor  R R wave progression, with no significant ST segment or T wave changes.   Patient was placed on furosemide for diuresis.   01/26 volume status has improved. Pending improvement in renal function.   Assessment and Plan: * Acute on chronic systolic CHF (congestive heart failure) (HCC) Echocardiogram with mild reduction in LV systolic function, EF 45 to 50%, global hypokinesis, mild LVH, RV systolic function preserved, RVSP 35,9 LA with moderate dilatation, small pericardial effusion. Mild to moderate MR and TR.   Documented urine output is 1,884 cc Systolic blood pressure is 120 and 130 mmHg.   Continue  blood pressure control with amlodipine and carvedilol.  After load reduction with hydralazine and isosorbide.  Limited pharmacologic options due to reduced GFR.  Hold on furosemide today due to rise in serum cr.   CAD S/P percutaneous coronary angioplasty No chest pain, troponin elevation due to heart failure decompensation. ACS ruled out.    Stage 3b chronic kidney disease (CKD) (HCC) Volume status has improved. Renal function with serum cr at 2,79 with K at 3,9 and serum bicarbonate at 22.  Mg 1,8 and NA 136.   Plan to continue close monitoring renal function and electrolytes. Hold on diuretic therapy for now.  Avoid hypotension or nephrotoxic medications.   Severe hypertension Blood pressure has improved, continue carvedilol, hydralazine and isosorbide,   Type 2 diabetes mellitus with hyperlipidemia (HCC) Fasting glucose is 99 Glucose has been stable, off insulin therapy. Will order home glucometer.  Dyslipidemia   History of pulmonary embolism Hx of DVT.  Continue anticoagulation with apixaban.         Subjective: Patient is feeling better, with improved dyspnea and edema. Today with dyspepsia but no nausea or vomiting.   Physical Exam: Vitals:   05/18/22 2038 05/19/22 0428 05/19/22 0814 05/19/22 1125  BP: (!) 127/109 128/78 (!) 150/97 130/87  Pulse: 92 96 98 91  Resp: '18 18 16   '$ Temp: (!) 97.4 F (36.3 C) 98.5 F (36.9 C) 98.4 F (36.9 C)   TempSrc: Oral Oral Oral   SpO2: 98% 95% 97% 96%  Weight:  97.4 kg    Height:  Neurology awake and alert ENT with no pallor or icterus Cardiovascular with S1 and S2 present and rhythmic with no gallops, rubs or murmurs Respiratory with no rales or wheezing Abdomen with no distention Trace non pitting ankle edema  Data Reviewed:    Family Communication: no family at the bedside   Disposition: Status is: Inpatient Remains inpatient appropriate because: Follow up on renal funcion   Planned Discharge  Destination: Home    Author: Tawni Millers, MD 05/19/2022 1:04 PM  For on call review [email or web address].

## 2022-05-19 NOTE — Progress Notes (Signed)
   Heart Failure Stewardship Pharmacist Progress Note   PCP: Selena Batten, NP PCP-Cardiologist: Mertie Moores, MD    HPI:  47 yo F with PMH of PE/DVT, CAD, T2DM, HLD, CKD, obesity, polysubstance use, OSA, diabetic gastroparesis, and DKA.  Admitted from 03/20/22 to 03/24/22 with acute PE and DVT now taking Eliquis. ECHO at that time showed LVEF 45-50%, mild concentric LVH, RV normal.   Presented to the ED on 1/23 with shortness of breath, weight gain, orthopnea, chest discomfort, and LE edema. CXR with stable cardiomegaly and mild central pulmonary vascular congestion.   Current HF Medications: Beta Blocker: carvedilol 25 mg BID Other: hydralazine 25 mg TID + Imdur 30 mg daily  Prior to admission HF Medications: Beta blocker: carvedilol 25 mg BID Other: hydralazine 10 mg TID  Pertinent Lab Values: Serum creatinine 2.79, BUN 30, Potassium 3.9, Sodium 136, BNP 131.7, A1c 5.8, Magnesium 1.8  Vital Signs: Weight: 214 lbs (admission weight: 213 lbs) Blood pressure: 130-150/90s  Heart rate: 90s  I/O: -0.6L yesterday; net -1.4L  Medication Assistance / Insurance Benefits Check: Does the patient have prescription insurance?  Yes Type of insurance plan: [place] Medicaid  Outpatient Pharmacy:  [facility] admission [facility] Is the patient willing to use [place] at discharge? Yes Is the patient willing to transition their outpatient pharmacy to utilize a [facility] outpatient pharmacy?   Pending    Assessment: 1. 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 NYHA class III symptoms. - PRN dosing of IV lasix given. 1 dose of 40 mg yesterday. Strict I/Os and daily weights. Keep K>4 and Mg>2. - Continue carvedilol 25 mg BID  - GDMT limited by CKD - Caution adding SGLT2i with history of DKA - SBP elevated 130-150s. Consider increasing to hydralazine 50 mg TID. Continue Imdur 30 mg daily.   Plan: 1) Medication changes recommended at this time: - Increase  hydralazine to 50 mg TID  2) Patient assistance: - None needed, has [place] Medicaid  3)  Education  - To be completed prior to discharge  Kerby Nora, PharmD, BCPS Heart Failure Stewardship Pharmacist Phone [phone]

## 2022-05-19 NOTE — TOC Progression Note (Addendum)
Transition of Care [facility]) - Progression Note    Patient Details  Name: Shirley [name] MRN: [id number] Date of Birth: March 19, 1976  [facility]) CM/SW Contact  Zenon Mayo, RN Phone Number: 05/19/2022, 11:32 AM  Clinical Narrative:    Patient would like a bp cuff and glucometer.  MD asked to send RX  to her pharmacy for patient , her Medicaid will cover it.  Also her copay for medications with Medicaid is 4.00.        Expected Discharge Plan and Services                                               Social Determinants of Health (SDOH) Interventions SDOH Screenings   Food Insecurity: No Food Insecurity (05/18/2022)  Housing: Low Risk  (05/18/2022)  Transportation Needs: No Transportation Needs (05/18/2022)  Utilities: Not At Risk (03/22/2022)  Alcohol Screen: Low Risk  (05/18/2022)  Depression (PHQ2-9): Medium Risk (11/28/2018)  Financial Resource Strain: Low Risk  (05/18/2022)  Tobacco Use: Medium Risk (05/18/2022)    Readmission Risk Interventions    03/24/2022    3:38 PM  Readmission Risk Prevention Plan  Transportation Screening Complete  Medication Review (RN Care Manager) Referral to Pharmacy  [place] or [place] Complete  SW Recovery Care/Counseling Consult Complete  [place] Not Applicable

## 2022-05-20 DIAGNOSIS — I2510 Atherosclerotic heart disease of native coronary artery without angina pectoris: Secondary | ICD-10-CM | POA: Diagnosis not present

## 2022-05-20 DIAGNOSIS — N179 Acute kidney failure, unspecified: Secondary | ICD-10-CM

## 2022-05-20 DIAGNOSIS — I5023 Acute on chronic systolic (congestive) heart failure: Secondary | ICD-10-CM | POA: Diagnosis not present

## 2022-05-20 DIAGNOSIS — I10 Essential (primary) hypertension: Secondary | ICD-10-CM | POA: Diagnosis not present

## 2022-05-20 DIAGNOSIS — N189 Chronic kidney disease, unspecified: Secondary | ICD-10-CM

## 2022-05-20 LAB — BASIC METABOLIC PANEL
Anion gap: 8 (ref 5–15)
BUN: 36 mg/dL — ABNORMAL HIGH (ref 6–20)
CO2: 20 mmol/L — ABNORMAL LOW (ref 22–32)
Calcium: 8.1 mg/dL — ABNORMAL LOW (ref 8.9–10.3)
Chloride: 107 mmol/L (ref 98–111)
Creatinine, Ser: 3.13 mg/dL — ABNORMAL HIGH (ref 0.44–1.00)
GFR, Estimated: 18 mL/min — ABNORMAL LOW (ref >60)
Glucose, Bld: 107 mg/dL — ABNORMAL HIGH (ref 70–99)
Potassium: 4 mmol/L (ref 3.5–5.1)
Sodium: 135 mmol/L (ref 135–145)

## 2022-05-20 LAB — GLUCOSE, CAPILLARY
Glucose-Capillary: 82 mg/dL (ref 70–99)
Glucose-Capillary: 113 mg/dL — ABNORMAL HIGH (ref 70–99)
Glucose-Capillary: 131 mg/dL — ABNORMAL HIGH (ref 70–99)

## 2022-05-20 NOTE — Plan of Care (Signed)
  Problem: Education: Goal: Knowledge of General Education information will improve Description: Including pain rating scale, medication(s)/side effects and non-pharmacologic comfort measures Outcome: Progressing   Problem: Health Behavior/Discharge Planning: Goal: Ability to manage health-related needs will improve Outcome: Progressing   Problem: Clinical Measurements: Goal: Ability to maintain clinical measurements within normal limits will improve Outcome: Progressing Goal: Will remain free from infection Outcome: Progressing Goal: Diagnostic test results will improve Outcome: Progressing Goal: Respiratory complications will improve Outcome: Progressing   Problem: Nutrition: Goal: Adequate nutrition will be maintained Outcome: Progressing   Problem: Coping: Goal: Level of anxiety will decrease Outcome: Progressing   Problem: Elimination: Goal: Will not experience complications related to bowel motility Outcome: Progressing Goal: Will not experience complications related to urinary retention Outcome: Progressing   Problem: Pain Managment: Goal: General experience of comfort will improve Outcome: Progressing   Problem: Safety: Goal: Ability to remain free from injury will improve Outcome: Progressing

## 2022-05-20 NOTE — Progress Notes (Signed)
Pt stated she doesn't wear CPAP at home and doesn't want to wear CPAP tonight.

## 2022-05-20 NOTE — Progress Notes (Signed)
Progress Note   Patient: Shirley [name] YKD:[id number] DOB: Mar 13, 1976 DOA: 05/16/2022     1 DOS: the patient was seen and examined on 05/20/2022   Brief hospital course: Shirley [name] was admitted to the hospital with the working diagnosis of decompensated heart failure.   47 yo female with the past medical history of heart failure, CKD, coronary artery disease, CVA, T2DM, DVT and bilateral PE who presented with dyspnea, chest pain and lower extremity edema. Reported one week of dyspnea and chest pressure, along with bilaterl lower extremity edema. She has been not compliant with her medications. On her initial physical examination her blood pressure was 178/114, HR 106, RR 24 and 02 saturation 94%, lungs with bilateral rales with increase work of breathing, heart with S1 and S2 present and rhythmic, abdomen with no distention, positive lower extremity edema.  Na 135, K 3,9 CL 105 bicarbonate 23, glucose 65, bun 28 cr 2,47  BNP 131.7  High sensitive troponin 48 and 44  Wbc 10,3 hgb 11.0 plt 382   Sars covid 19 negative Influenza A and B negative  Toxicology negative   Chest radiograph with cardiomegaly with bilateral hilar vascular congestion and some cephalization of the vasculature, small left pleural effusion.   EKG 108 bpm, normal axis, normal intervals, sinus rhythm with poor  R R wave progression, with no significant ST segment or T wave changes.   Patient was placed on furosemide for diuresis.   01/26 volume status has improved. Pending improvement in renal function.  01/27 renal function still not back at baseline.   Assessment and Plan: * Acute on chronic systolic CHF (congestive heart failure) (HCC) Echocardiogram with mild reduction in LV systolic function, EF 45 to 50%, global hypokinesis, mild LVH, RV systolic function preserved, RVSP 35,9 LA with moderate dilatation, small pericardial effusion. Mild to moderate MR and TR.   Her volume status continue to improve.  Urine  output is not documented.   Continue blood pressure control with amlodipine and carvedilol.  After load reduction with hydralazine and isosorbide.  Limited pharmacologic options due to reduced GFR.  Continue to hold on furosemide until renal function more stable.   CAD S/P percutaneous coronary angioplasty No chest pain, troponin elevation due to heart failure decompensation. ACS ruled out.    Acute kidney injury superimposed on chronic kidney disease (HCC) CKD stage 3b.   Renal function still not back to baseline, today with serum cr at 3.1 with K at 4.0 and serum bicarbonate at 20. Na 135.   Plan to continue close monitoring renal function and electrolytes. Hold on diuretic therapy for now.  Avoid hypotension or nephrotoxic medications.   Severe hypertension Blood pressure has improved, continue carvedilol, hydralazine and isosorbide,   Type 2 diabetes mellitus with hyperlipidemia (HCC) Fasting glucose is 107 Glucose has been stable, off insulin therapy. Will order home glucometer.  Dyslipidemia   History of pulmonary embolism Hx of DVT.  Continue anticoagulation with apixaban.         Subjective: Patient is feeling better, no chest pain or dyspnea, edema continue to improve, she is anxious to go home.   Physical Exam: Vitals:   05/20/22 0300 05/20/22 0434 05/20/22 0543 05/20/22 0932  BP:  124/76 127/80 (!) 148/96  Pulse:  99    Resp:  18    Temp: 98.5 F (36.9 C) 98.5 F (36.9 C)    TempSrc:  Oral    SpO2: 96%     Weight: 98.1 kg  Height:       Neurology awake and alert ENT with mild pallor Cardiovascular with S1 and S2 present and rhythmic with no gallops, rubs or murmurs Respiratory with no rales or wheezing Abdomen with no distention  Trace non pitting lower extremity edema  Data Reviewed:    Family Communication: no family at the bedside   Disposition: Status is: Inpatient Remains inpatient appropriate because: renal failure   Planned  Discharge Destination: Home possible dc home tomorrow.     Author: Tawni Millers, MD 05/20/2022 12:07 PM  For on call review [email or web address].

## 2022-05-21 DIAGNOSIS — N179 Acute kidney failure, unspecified: Secondary | ICD-10-CM | POA: Diagnosis not present

## 2022-05-21 DIAGNOSIS — I2510 Atherosclerotic heart disease of native coronary artery without angina pectoris: Secondary | ICD-10-CM | POA: Diagnosis not present

## 2022-05-21 DIAGNOSIS — I5023 Acute on chronic systolic (congestive) heart failure: Secondary | ICD-10-CM | POA: Diagnosis not present

## 2022-05-21 DIAGNOSIS — I10 Essential (primary) hypertension: Secondary | ICD-10-CM | POA: Diagnosis not present

## 2022-05-21 LAB — BASIC METABOLIC PANEL
Anion gap: 10 (ref 5–15)
BUN: 46 mg/dL — ABNORMAL HIGH (ref 6–20)
CO2: 18 mmol/L — ABNORMAL LOW (ref 22–32)
Calcium: 8 mg/dL — ABNORMAL LOW (ref 8.9–10.3)
Chloride: 107 mmol/L (ref 98–111)
Creatinine, Ser: 3.27 mg/dL — ABNORMAL HIGH (ref 0.44–1.00)
GFR, Estimated: 17 mL/min — ABNORMAL LOW (ref >60)
Glucose, Bld: 106 mg/dL — ABNORMAL HIGH (ref 70–99)
Potassium: 3.8 mmol/L (ref 3.5–5.1)
Sodium: 135 mmol/L (ref 135–145)

## 2022-05-21 LAB — GLUCOSE, CAPILLARY: Glucose-Capillary: 129 mg/dL — ABNORMAL HIGH (ref 70–99)

## 2022-05-21 MED ORDER — FUROSEMIDE 10 MG/ML IJ SOLN
60.0000 mg | Freq: Once | INTRAMUSCULAR | Status: AC
  Administered 2022-05-21: 60 mg via INTRAVENOUS
  Filled 2022-05-21: qty 6

## 2022-05-21 MED ORDER — CALCIUM CARBONATE ANTACID 500 MG PO CHEW
200.0000 mg | CHEWABLE_TABLET | Freq: Two times a day (BID) | ORAL | Status: DC | PRN
  Administered 2022-05-22 – 2022-05-23 (×3): 200 mg via ORAL
  Filled 2022-05-21 (×3): qty 1

## 2022-05-21 NOTE — Plan of Care (Signed)
  Problem: Education: Goal: Knowledge of General Education information will improve Description: Including pain rating scale, medication(s)/side effects and non-pharmacologic comfort measures Outcome: Progressing   Problem: Health Behavior/Discharge Planning: Goal: Ability to manage health-related needs will improve Outcome: Progressing   Problem: Clinical Measurements: Goal: Ability to maintain clinical measurements within normal limits will improve Outcome: Progressing Goal: Will remain free from infection Outcome: Progressing Goal: Diagnostic test results will improve Outcome: Progressing Goal: Respiratory complications will improve Outcome: Progressing Goal: Cardiovascular complication will be avoided Outcome: Progressing   Problem: Activity: Goal: Risk for activity intolerance will decrease Outcome: Progressing   Problem: Nutrition: Goal: Adequate nutrition will be maintained Outcome: Progressing   Problem: Coping: Goal: Level of anxiety will decrease Outcome: Progressing   Problem: Elimination: Goal: Will not experience complications related to bowel motility Outcome: Progressing Goal: Will not experience complications related to urinary retention Outcome: Progressing   Problem: Pain Managment: Goal: General experience of comfort will improve Outcome: Progressing   Problem: Safety: Goal: Ability to remain free from injury will improve Outcome: Progressing   Problem: Skin Integrity: Goal: Risk for impaired skin integrity will decrease Outcome: Progressing   Problem: Education: Goal: Ability to describe self-care measures that may prevent or decrease complications (Diabetes Survival Skills Education) will improve Outcome: Progressing Goal: Individualized Educational Video(s) Outcome: Progressing   Problem: Coping: Goal: Ability to adjust to condition or change in health will improve Outcome: Progressing   Problem: Fluid Volume: Goal: Ability to  maintain a balanced intake and output will improve Outcome: Progressing   Problem: Health Behavior/Discharge Planning: Goal: Ability to identify and utilize available resources and services will improve Outcome: Progressing Goal: Ability to manage health-related needs will improve Outcome: Progressing   Problem: Metabolic: Goal: Ability to maintain appropriate glucose levels will improve Outcome: Progressing   Problem: Nutritional: Goal: Maintenance of adequate nutrition will improve Outcome: Progressing Goal: Progress toward achieving an optimal weight will improve Outcome: Progressing   Problem: Skin Integrity: Goal: Risk for impaired skin integrity will decrease Outcome: Progressing   Problem: Tissue Perfusion: Goal: Adequacy of tissue perfusion will improve Outcome: Progressing   Problem: Education: Goal: Ability to demonstrate management of disease process will improve Outcome: Progressing Goal: Ability to verbalize understanding of medication therapies will improve Outcome: Progressing Goal: Individualized Educational Video(s) Outcome: Progressing   Problem: Activity: Goal: Capacity to carry out activities will improve Outcome: Progressing   Problem: Cardiac: Goal: Ability to achieve and maintain adequate cardiopulmonary perfusion will improve Outcome: Progressing

## 2022-05-21 NOTE — Progress Notes (Signed)
Pt stated she doesn't wear CPAP at home, refusing to try CPAP for the night.

## 2022-05-21 NOTE — Progress Notes (Signed)
Progress Note   Patient: Shirley [name] [id number]:[id number] DOB: 19-Feb-1976 DOA: 05/16/2022     2 DOS: the patient was seen and examined on 05/21/2022   Brief hospital course: Shirley [name] was admitted to the hospital with the working diagnosis of decompensated heart failure.   47 yo female with the past medical history of heart failure, CKD, coronary artery disease, CVA, T2DM, DVT and bilateral PE who presented with dyspnea, chest pain and lower extremity edema. Reported one week of dyspnea and chest pressure, along with bilaterl lower extremity edema. She has been not compliant with her medications. On her initial physical examination her blood pressure was 178/114, HR 106, RR 24 and 02 saturation 94%, lungs with bilateral rales with increase work of breathing, heart with S1 and S2 present and rhythmic, abdomen with no distention, positive lower extremity edema.  Na 135, K 3,9 CL 105 bicarbonate 23, glucose 65, bun 28 cr 2,47  BNP 131.7  High sensitive troponin 48 and 44  Wbc 10,3 hgb 11.0 plt 382   Sars covid 19 negative Influenza A and B negative  Toxicology negative   Chest radiograph with cardiomegaly with bilateral hilar vascular congestion and some cephalization of the vasculature, small left pleural effusion.   EKG 108 bpm, normal axis, normal intervals, sinus rhythm with poor  R R wave progression, with no significant ST segment or T wave changes.   Patient was placed on furosemide for diuresis.   01/26 volume status has improved. Pending improvement in renal function.  01/27 renal function still not back at baseline.  01/28 worsening renal failure and edema.   Assessment and Plan: * Acute on chronic systolic CHF (congestive heart failure) (HCC) Echocardiogram with mild reduction in LV systolic function, EF 45 to 50%, global hypokinesis, mild LVH, RV systolic function preserved, RVSP 35,9 LA with moderate dilatation, small pericardial effusion. Mild to moderate MR and TR.    Today patient with lower extremity edema Documented urine output is 600 cc.  Continue blood pressure control with amlodipine and carvedilol.  After load reduction with hydralazine and isosorbide.  Limited pharmacologic options due to reduced GFR.  Because worsening edema will give today furosemide 60 mg IV once.   CAD S/P percutaneous coronary angioplasty No chest pain, troponin elevation due to heart failure decompensation. ACS ruled out.    Acute kidney injury superimposed on chronic kidney disease (HCC) CKD stage 3b.   Today with worsening edema at her lower extremities Renal function with serum cr up to 3,27 with K at 3,8 and serum bicarbonate at 18.   Resume diuresis with furosemide 60 mg IV x1 and follow up renal function in am. Avoid hypotension and nephrotoxic medications.   Severe hypertension Blood pressure control with carvedilol, hydralazine and isosorbide,   Type 2 diabetes mellitus with hyperlipidemia (HCC) Fasting glucose is 106 Glucose has been stable, off insulin therapy. Will order home glucometer.  Dyslipidemia   History of pulmonary embolism Hx of DVT.  Continue anticoagulation with apixaban.         Subjective: Patient with worsening edema at her lower extremities, no chest pain or dyspnea.   Physical Exam: Vitals:   05/20/22 1700 05/20/22 1940 05/20/22 2124 05/21/22 0417  BP: (!) 123/92 126/76 122/81 129/84  Pulse: 95 87  93  Resp: 20   17  Temp: 98.4 F (36.9 C) 97.7 F (36.5 C)  99 F (37.2 C)  TempSrc: Oral Oral  Oral  SpO2: 97% 96%  94%  Weight:  98.8 kg  Height:       Neurology awake and alert ENT with mild pallor Cardiovascular with S1 and S2 present and rhythmic with no gallops, rubs or murmurs Respiratory with no rales or wheezing Abdomen with no distention Positive lower extremity edema ++  Data Reviewed:    Family Communication: no family at the bedside   Disposition: Status is: Inpatient Remains inpatient  appropriate because: renal failure and heart failure   Planned Discharge Destination: Home      Author: Tawni Millers, MD 05/21/2022 11:40 AM  For on call review [email or web address].

## 2022-05-22 ENCOUNTER — Inpatient Hospital Stay (HOSPITAL_COMMUNITY): Payer: Medicare HMO

## 2022-05-22 DIAGNOSIS — I2510 Atherosclerotic heart disease of native coronary artery without angina pectoris: Secondary | ICD-10-CM | POA: Diagnosis not present

## 2022-05-22 DIAGNOSIS — I10 Essential (primary) hypertension: Secondary | ICD-10-CM | POA: Diagnosis not present

## 2022-05-22 DIAGNOSIS — N179 Acute kidney failure, unspecified: Secondary | ICD-10-CM | POA: Diagnosis not present

## 2022-05-22 DIAGNOSIS — I5023 Acute on chronic systolic (congestive) heart failure: Secondary | ICD-10-CM | POA: Diagnosis not present

## 2022-05-22 LAB — BASIC METABOLIC PANEL
Anion gap: 7 (ref 5–15)
BUN: 45 mg/dL — ABNORMAL HIGH (ref 6–20)
CO2: 21 mmol/L — ABNORMAL LOW (ref 22–32)
Calcium: 8 mg/dL — ABNORMAL LOW (ref 8.9–10.3)
Chloride: 107 mmol/L (ref 98–111)
Creatinine, Ser: 3.41 mg/dL — ABNORMAL HIGH (ref 0.44–1.00)
GFR, Estimated: 16 mL/min — ABNORMAL LOW (ref >60)
Glucose, Bld: 140 mg/dL — ABNORMAL HIGH (ref 70–99)
Potassium: 3.8 mmol/L (ref 3.5–5.1)
Sodium: 135 mmol/L (ref 135–145)

## 2022-05-22 LAB — URINALYSIS, ROUTINE W REFLEX MICROSCOPIC
Bacteria, UA: NONE SEEN
Bilirubin Urine: NEGATIVE
Glucose, UA: 50 mg/dL — AB
Ketones, ur: NEGATIVE mg/dL
Leukocytes,Ua: NEGATIVE
Nitrite: NEGATIVE
Protein, ur: >=300 mg/dL — AB
Specific Gravity, Urine: 1.011 (ref 1.005–1.030)
pH: 5 (ref 5.0–8.0)

## 2022-05-22 LAB — PROTEIN / CREATININE RATIO, URINE
Creatinine, Urine: 38.14 mg/dL
Protein Creatinine Ratio: 3.96 mg/mg{Cre} — ABNORMAL HIGH (ref 0.00–0.15)
Total Protein, Urine: 151 mg/dL

## 2022-05-22 LAB — SODIUM, URINE, RANDOM: Sodium, Ur: 94 mmol/L

## 2022-05-22 MED ORDER — PROCHLORPERAZINE MALEATE 5 MG PO TABS
5.0000 mg | ORAL_TABLET | Freq: Four times a day (QID) | ORAL | Status: DC | PRN
  Administered 2022-05-22 – 2022-05-23 (×2): 5 mg via ORAL
  Filled 2022-05-22 (×4): qty 1

## 2022-05-22 MED ORDER — ONDANSETRON HCL 4 MG/2ML IJ SOLN
4.0000 mg | Freq: Four times a day (QID) | INTRAMUSCULAR | Status: DC | PRN
  Administered 2022-05-22: 4 mg via INTRAVENOUS
  Filled 2022-05-22: qty 2

## 2022-05-22 MED ORDER — FUROSEMIDE 10 MG/ML IJ SOLN
60.0000 mg | Freq: Two times a day (BID) | INTRAMUSCULAR | Status: DC
  Administered 2022-05-22 – 2022-05-23 (×3): 60 mg via INTRAVENOUS
  Filled 2022-05-22 (×3): qty 6

## 2022-05-22 NOTE — Evaluation (Signed)
Occupational Therapy Evaluation Patient Details Name: Shirley [name] MRN: [id number] DOB: 1975/10/12 Today's Date: 05/22/2022   History of Present Illness Shirley [name] is a 47 yo female who presented with dyspnea, chest pain and lower extremity edema. PMHx: heart failure, CKD, coronary artery disease, CVA, T2DM, DVT and bilateral PE   Clinical Impression   Pt was evaluated s/p the above admission list, she is typically indep at baseline and lives with her boyfriend who works. Upon evaluation pt was in the restroom upon arrival, she was limited by abdominal pain, generalized weakness, decreased activity tolerance and unsteady gait. Overall she was supervision - min G for mobility  and up to min G for LB ADLs. Anticipate pt is near her functional baseline, she will benefit from OT acutely. Recommend d/c to home without follow up OT.      Recommendations for follow up therapy are one component of a multi-disciplinary discharge planning process, led by the attending physician.  Recommendations may be updated based on patient status, additional functional criteria and insurance authorization.   Follow Up Recommendations  No OT follow up     Assistance Recommended at Discharge PRN  Patient can return home with the following Assist for transportation;Assistance with cooking/housework    Functional Status Assessment  Patient has had a recent decline in their functional status and demonstrates the ability to make significant improvements in function in a reasonable and predictable amount of time.  Equipment Recommendations  None recommended by OT (Pt reports she needs a BP machine and glucose meter)       Precautions / Restrictions Precautions Precautions: Fall Restrictions Weight Bearing Restrictions: No      Mobility Bed Mobility Overal bed mobility: Independent                  Transfers Overall transfer level: Modified independent                         Balance Overall balance assessment: Mild deficits observed, not formally tested                 ADL either performed or assessed with clinical judgement   ADL Overall ADL's : Needs assistance/impaired Eating/Feeding: Independent;Sitting   Grooming: Supervision/safety;Standing Grooming Details (indicate cue type and reason): at the sink Upper Body Bathing: Set up;Sitting   Lower Body Bathing: Min guard;Sit to/from stand   Upper Body Dressing : Set up;Sitting   Lower Body Dressing: Supervision/safety;Sit to/from stand Lower Body Dressing Details (indicate cue type and reason): able to don socks EOB Toilet Transfer: Supervision/safety;Ambulation Toilet Transfer Details (indicate cue type and reason): no AD Toileting- Clothing Manipulation and Hygiene: Modified independent;Sitting/lateral lean       Functional mobility during ADLs: Supervision/safety General ADL Comments: limited by abdominal pain, activity tolerance     Vision Baseline Vision/History: 0 No visual deficits Vision Assessment?: No apparent visual deficits     Perception Perception Perception Tested?: No   Praxis Praxis Praxis tested?: Not tested    Pertinent Vitals/Pain Pain Assessment Pain Assessment: Faces Faces Pain Scale: Hurts Hallgren more Pain Location: stomach Pain Descriptors / Indicators: Discomfort Pain Intervention(s): Limited activity within patient's tolerance, Monitored during session     Hand Dominance Right   Extremity/Trunk Assessment Upper Extremity Assessment Upper Extremity Assessment: Generalized weakness   Lower Extremity Assessment Lower Extremity Assessment: Defer to PT evaluation   Cervical / Trunk Assessment Cervical / Trunk Assessment: Normal   Communication Communication Communication:  No difficulties   Cognition Arousal/Alertness: Awake/alert Behavior During Therapy: WFL for tasks assessed/performed Overall Cognitive Status: Within Functional Limits for  tasks assessed             General Comments  VSS on RA            Home Living Family/patient expects to be discharged to:: Private residence Living Arrangements: Spouse/significant other Available Help at Discharge: Other (Comment) (boyfriend) Type of Home: Apartment Home Access: Level entry     Home Layout: One level     Bathroom Shower/Tub: Teacher, early years/pre: Handicapped height     Home Equipment: None          Prior Functioning/Environment Prior Level of Function : Driving;Independent/Modified Independent             Mobility Comments: no AD ADLs Comments: does not work, reports she normally gets down into the tub to bathe        OT Problem List: Decreased activity tolerance;Cardiopulmonary status limiting activity      OT Treatment/Interventions: Self-care/ADL training;Therapeutic exercise;DME and/or AE instruction;Therapeutic activities;Patient/family education;Balance training    OT Goals(Current goals can be found in the care plan section) Acute Rehab OT Goals Patient Stated Goal: home OT Goal Formulation: With patient Time For Goal Achievement: 06/05/22 Potential to Achieve Goals: Good ADL Goals Additional ADL Goal #1: pt will indep recall at least 3 energy conservation techniques to apply at discharge Additional ADL Goal #2: Pt will indep complete IADL medicaiton management task  OT Frequency: Min 2X/week       AM-PAC OT "6 Clicks" Daily Activity     Outcome Measure Help from another person eating meals?: None Help from another person taking care of personal grooming?: A [place] Help from another person toileting, which includes using toliet, bedpan, or urinal?: A [place] Help from another person bathing (including washing, rinsing, drying)?: A [place] Help from another person to put on and taking off regular upper body clothing?: None Help from another person to put on and taking off regular lower body clothing?: A  [place] 6 Click Score: 20   End of Session Nurse Communication: Mobility status  Activity Tolerance: Patient tolerated treatment well Patient left: in bed;with call bell/phone within reach  OT Visit Diagnosis: Unsteadiness on feet (R26.81);Other abnormalities of gait and mobility (R26.89);Muscle weakness (generalized) (M62.81)                Time: 8242-3536 OT Time Calculation (min): 15 min Charges:  OT General Charges $OT Visit: 1 Visit OT Evaluation $OT Eval Moderate Complexity: 1 Mod   Christelle Igoe D Causey 05/22/2022, 5:19 PM

## 2022-05-22 NOTE — Plan of Care (Signed)
  Problem: Nutritional: Goal: Maintenance of adequate nutrition will improve Outcome: Completed/Met   Problem: Skin Integrity: Goal: Risk for impaired skin integrity will decrease Outcome: Completed/Met   Problem: Activity: Goal: Capacity to carry out activities will improve Outcome: Completed/Met

## 2022-05-22 NOTE — Progress Notes (Signed)
   Heart Failure Stewardship Pharmacist Progress Note   PCP: Selena Batten, NP PCP-Cardiologist: Mertie Moores, MD    HPI:  47 yo F with PMH of PE/DVT, CAD, T2DM, HLD, CKD, obesity, polysubstance use, OSA, diabetic gastroparesis, and DKA.  Admitted from 03/20/22 to 03/24/22 with acute PE and DVT now taking Eliquis. ECHO at that time showed LVEF 45-50%, mild concentric LVH, RV normal.   Presented to the ED on 1/23 with shortness of breath, weight gain, orthopnea, chest discomfort, and LE edema. CXR with stable cardiomegaly and mild central pulmonary vascular congestion.   Current HF Medications: Diuretic: furosemide 60 mg IV BID Beta Blocker: carvedilol 25 mg BID Other: hydralazine 25 mg TID + Imdur 30 mg daily  Prior to admission HF Medications: Beta blocker: carvedilol 25 mg BID Other: hydralazine 10 mg TID  Pertinent Lab Values: Serum creatinine 3.14, BUN 45, Potassium 3.8, Sodium 135, BNP 131.7, A1c 5.8, Magnesium 1.8  Vital Signs: Weight: 218 lbs (admission weight: 213 lbs) Blood pressure: 150/90s  Heart rate: 90s  I/O: -0.2L yesterday; net -0.4L  Medication Assistance / Insurance Benefits Check: Does the patient have prescription insurance?  Yes Type of insurance plan: [place] Medicaid  Outpatient Pharmacy:  Prior to admission outpatient pharmacy: [facility] Is the patient willing to use [facility] at discharge? Yes Is the patient willing to transition their outpatient pharmacy to utilize a [facility] outpatient pharmacy?   Pending    Assessment: 1. 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 NYHA class III symptoms. - Continue furosemide 60 mg IV BID. Strict I/Os and daily weights. Keep K>4 and Mg>2. - Continue carvedilol 25 mg BID  - GDMT limited by CKD - Caution adding SGLT2i with history of DKA - SBP elevated 150s. Consider increasing to hydralazine 50 mg TID. Continue Imdur 30 mg daily.   Plan: 1) Medication changes recommended at this time: - Increase  hydralazine to 50 mg TID  2) Patient assistance: - None needed, has [place] Medicaid  3)  Education  - To be completed prior to discharge  Kerby Nora, PharmD, BCPS Heart Failure Stewardship Pharmacist Phone [phone]

## 2022-05-22 NOTE — Consult Note (Signed)
Nephrology Consult   Requesting provider: S[facility] Service requesting consult: Hospitalist Reason for consult: AKI on CKD   Assessment/Recommendations: Shirley [name] is a/an 47 y.o. 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  -Avoid nephrotoxic medications including NSAIDs -Use synthetic opioids (Fentanyl/Dilaudid) if needed -Currently no indication for HD -Will need f/u in our office at [place]. I don't think she has to remain in the hospital much longer if volume is optimized and crt stabilizes  Acute on chronic CHF exacterbation. Reportedly mixed reduced EF and HFpEF. Ef in November was 45-50%. Possibly PE in Nov/Dec has effected her CO. Continue with diuretics. Consider repeat echo/cards involvement  CAD: home meds HTN: improving. Cont current meds Uncontrolled Diabetes Mellitus Type 2 with Hyperglycemia: meds per primary PE: on anticoagulation     Recommendations conveyed to primary service.    [facility] 05/22/2022 7:25 PM   _____________________________________________________________________________________ CC: Shortness of breath  History of Present Illness: Shirley [name] is a/an 47 y.o. female with a past medical history of HFrEF, CAD s/p PCI 2008, CVA 2015, DM2, HTN, recent DVT/PE who presents with SOB.  Patient presented to the hospital on 05/17/2022.  She states that for about a week prior to  admission she started having problems with bilateral leg swelling.  She noted that the swelling was getting worse and worse.  She also had a cough, dyspnea on exertion, chest pain, orthopnea.  Because the symptoms were getting worse she discussed her issues with her primary care provider.  However, ultimately the symptoms got to the point where she felt like she needed to come to the emergency department.  She denied fevers, chills, nausea, vomiting, diarrhea.  She has not noticed any change in her urine output.  No NSAID use.  States she has been compliant with her medications.  In the emergency department she was noted to be hypertensive.  Chest x-ray demonstrated pulmonary edema.  Troponin was mildly elevated.  She was admitted for heart failure exacerbation and has been undergoing diuresis.  The patient states that since she came in she is feeling a lot better.  Her shortness of breath is significantly improved.  Her edema has also significantly improved.  Interestingly her weight has reported in the EHR has not improved and if anything is higher than when she arrived. Ins and Outs not well recorded.   She only remembers hearing about kidney disease a few months ago. Crt seems to have progressively worsened since 2019 with recent baseline around 2.5. Uas in the past with significant proteinuria. Long standing diabetes.Crt was 2.5 on arrival and has steadily increased since 05/18/22 to now be 3.4 today.  She was hospitalized in December for bilateral PE and DVT   Medications:  Current Facility-Administered Medications  Medication Dose Route Frequency Provider Last Rate Last Admin   0.9 %  sodium chloride infusion  250 mL Intravenous PRN Wynetta Fines T, MD       acetaminophen (TYLENOL) tablet 650 mg  650 mg Oral  Q4H PRN Lequita Halt, MD   650 mg at 05/22/22 1434   amLODipine (NORVASC) tablet 10 mg  10 mg Oral Daily Regan Lemming, MD   10 mg at 05/22/22 5726   apixaban (ELIQUIS) tablet 5 mg  5 mg  Oral BID Regan Lemming, MD   5 mg at 05/22/22 2035   atorvastatin (LIPITOR) tablet 40 mg  40 mg Oral Daily Regan Lemming, MD   40 mg at 05/22/22 5974   calcium carbonate (TUMS - dosed in mg elemental calcium) chewable tablet 200 mg of elemental calcium  200 mg of elemental calcium Oral BID PRN Arrien, Jimmy Picket, MD   200 mg of elemental calcium at 05/22/22 0828   carvedilol (COREG) tablet 25 mg  25 mg Oral BID WC Regan Lemming, MD   25 mg at 05/22/22 1639   Chlorhexidine Gluconate Cloth 2 % PADS 6 each  6 each Topical Q0600 Tawni Millers, MD   6 each at 05/21/22 0858   dicyclomine (BENTYL) capsule 10 mg  10 mg Oral TID AC & HS Regan Lemming, MD   10 mg at 05/22/22 1639   famotidine (PEPCID) tablet 20 mg  20 mg Oral QPM Regan Lemming, MD   20 mg at 05/22/22 1903   furosemide (LASIX) injection 60 mg  60 mg Intravenous Q12H ArrienJimmy Picket, MD   60 mg at 05/22/22 1057   gabapentin (NEURONTIN) capsule 400 mg  400 mg Oral TID PRN Dory Horn, MD       hydrALAZINE (APRESOLINE) tablet 25 mg  25 mg Oral Q8H Wynetta Fines T, MD   25 mg at 05/22/22 1434   isosorbide mononitrate (IMDUR) 24 hr tablet 30 mg  30 mg Oral Daily Wynetta Fines T, MD   30 mg at 05/22/22 1638   mupirocin ointment (BACTROBAN) 2 % 1 Application  1 Application Nasal BID Arrien, Jimmy Picket, MD   1 Application at 45/36/46 0832   ondansetron (ZOFRAN) injection 4 mg  4 mg Intravenous Q6H PRN Arrien, Jimmy Picket, MD   4 mg at 05/22/22 8032   oxyCODONE-acetaminophen (PERCOCET/ROXICET) 5-325 MG per tablet 1 tablet  1 tablet Oral Q8H PRN Arrien, Jimmy Picket, MD   1 tablet at 05/22/22 1639   pantoprazole (PROTONIX) EC tablet 40 mg  40 mg Oral BID Tawni Millers, MD   40 mg at 05/22/22 1224   prochlorperazine (COMPAZINE) tablet 5 mg  5 mg Oral Q6H PRN Arrien, Jimmy Picket, MD   5 mg at 05/22/22 1327   sodium chloride flush (NS) 0.9 % injection 3 mL  3 mL Intravenous Q12H Wynetta Fines T, MD   3  mL at 05/22/22 8250   sodium chloride flush (NS) 0.9 % injection 3 mL  3 mL Intravenous PRN Lequita Halt, MD         ALLERGIES Lisinopril and Morphine and related  MEDICAL HISTORY Past Medical History:  Diagnosis Date   Abscess of tunica vaginalis    10/09- Abundant S. aureus- sensitive to all abx   Anxiety    Blood dyscrasia    CAD (coronary artery disease) 06/15/2006   s/p Subendocardial MI with PDA angioplasty(no stent) on 06/15/06 and relook  cath 06/19/06 showed patency of site. Cath 12/10- no restenosis or significant CAD progression   CVA (cerebral vascular accident) ([place]) ~ 02/2014   denies residual on 04/22/2014   CVA (cerebral vascular accident) [facility])    history of remote right cerebellar infarct noted on head CT  at least since 10/2011   Depression    Diabetes mellitus type 2, uncontrolled, with complications    Fibromyalgia    Gastritis    Gastroparesis    secondary to poorly controlled DM, last emptying study performed 01/2010  was normal but may be falsely positive as pt was on reglan   GERD (gastroesophageal reflux disease)    Hepatitis B, chronic (HCC)    Hep BeAb+,Hep B cAb+ & Hep BsAg+ (9/06)   History of pyelonephritis    H/o GrpB Pyelonephritis (9/06) and UTI- 07/11- E.Coli, 12/10- GBS   Hyperlipidemia    Hypertension    Iron deficiency anemia    Irregular menses    Small ovarian follicles seen on CL(2/75)   MI (myocardial infarction) ([place]) 05/2006   PDA percutaneous transluminal coronary angioplasty   Migraine    "weekly" (04/22/2014)   N&V (nausea and vomiting)    Chronic. Unclear etiology with multiple admission and ED visits. CT abdomen with and without contrast (02/2011)  showed no acute process. Gastic Emptying scan (01/2010) was normal. Ultrasound of the abdomen was within normal limits. Hepatitis B viral load was undectable. HIV NR. EGD - gastritis, Hpylori + s/p Rx   New onset of congestive heart failure ([place]) 05/16/2022   Obesity    OSA  (obstructive sleep apnea)    "suppose to wear mask but I don't" (04/22/2014)   Peripheral neuropathy    Pneumonia    "this is probably the 2nd or 3rd time I've had pneumonia" (04/22/2014)   Recurrent boils    Seasonal asthma    Substance abuse (HCC)    Thrombocytosis    Hem/Onc suggested 2/2 chronic hepatits and/or iron deficiency anemia     SOCIAL HISTORY Social History   Socioeconomic History   Marital status: Single    Spouse name: Not on file   Number of children: 2   Years of education: 11   Highest education level: Not on file  Occupational History   Occupation: other    Comment: unemployed    Comment: disability  Tobacco Use   Smoking status: Former    Types: Cigarettes    Quit date: 04/24/1996    Years since quitting: 26.0   Smokeless tobacco: Never   Tobacco comments:    quit smoking cigarettes age 38  Vaping Use   Vaping Use: Never used  Substance and Sexual Activity   Alcohol use: No    Alcohol/week: 0.0 standard drinks of alcohol    Comment: 04/22/2014 "might have a few drinks a month"   Drug use: Not Currently    Types: Marijuana, Cocaine    Comment: 04/22/2104 "quit drugs ~ 1-2 yr ago"   Sexual activity: Yes    Birth control/protection: None  Other Topics Concern   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   Also works at [employer] having to lift elderly individuals.         Social Determinants of Health   Financial Resource Strain: Low Risk  (05/18/2022)   Overall Financial Resource Strain (CARDIA)    Difficulty of Paying Living Expenses: Not very hard  Food Insecurity: No Food Insecurity (05/18/2022)   Hunger Vital Sign    Worried About Running Out of Food in the Last Year: Never true    Ran Out of Food in the Last Year: Never true  Transportation Needs: No Transportation Needs (05/18/2022)  PRAPARE - Hydrologist (Medical): No     Lack of Transportation (Non-Medical): No  Physical Activity: Not on file  Stress: Not on file  Social Connections: Not on file  Intimate Partner Violence: Not At Risk (03/22/2022)   Humiliation, Afraid, Rape, and Kick questionnaire    Fear of Current or Ex-Partner: No    Emotionally Abused: No    Physically Abused: No    Sexually Abused: No     FAMILY HISTORY Family History  Problem Relation Age of Onset   Diabetes Father    Healthy Mother       Review of Systems: 12 systems reviewed Otherwise as per HPI, all other systems reviewed and negative  Physical Exam: Vitals:   05/22/22 0818 05/22/22 1300  BP: (!) 153/101 138/79  Pulse: 98 98  Resp: 18 18  Temp: 98.8 F (37.1 C) 97.8 F (36.6 C)  SpO2: 100% 97%   No intake/output data recorded.  Intake/Output Summary (Last 24 hours) at 05/22/2022 1925 Last data filed at 05/22/2022 0502 Gross per 24 hour  Intake 340 ml  Output 401 ml  Net -61 ml   General: well-appearing, no acute distress HEENT: anicteric sclera, oropharynx clear without lesions CV: Normal rate, no murmurs, no rub, 1+ pitting edema in the bilateral lower extremities up to the knee Lungs: Diminished breath sounds in the bilateral bases, normal work of breathing Abd: soft, non-tender, non-distended Skin: no visible lesions or rashes Psych: alert, engaged, appropriate mood and affect Musculoskeletal: no obvious deformities Neuro: normal speech, no gross focal deficits   Test Results Reviewed Lab Results  Component Value Date   NA 135 05/22/2022   K 3.8 05/22/2022   CL 107 05/22/2022   CO2 21 (L) 05/22/2022   BUN 45 (H) 05/22/2022   CREATININE 3.41 (H) 05/22/2022   CALCIUM 8.0 (L) 05/22/2022   ALBUMIN 2.0 (L) 03/24/2022   PHOS 3.3 03/24/2022    CBC Recent Labs  Lab 05/16/22 2004  WBC 10.3  HGB 11.0*  HCT 32.4*  MCV 75.9*  PLT 382    I have reviewed all relevant outside healthcare records related to the patient's current  hospitalization

## 2022-05-22 NOTE — Progress Notes (Signed)
PT Cancellation Note  Patient Details Name: Shirley [name] MRN: [id number] DOB: [birth year]   Cancelled Treatment:    Reason Eval/Treat Not Completed: Patient declined, no reason specified Complaining of abdominal pain, awaiting medicine. Requests evaluation by PT be held at this time. Will follow up as schedule permits, likely tomorrow.  Ellouise Newer 05/22/2022, 3:33 PM

## 2022-05-22 NOTE — Progress Notes (Signed)
Patient is lying in bed with eyes closed, visibly rocking, and audibly moaning. Reports abdominal pain is 8/10. Request made to pharmacy to send PRN Compazine to unit. Patient made aware of new order for UA. Supplies left in room. Patient requested to contact writer when urine sample is ready. IV Lasix given as ordered. Call bell within reach. Will continue to monitor.

## 2022-05-22 NOTE — Progress Notes (Signed)
Pt refusing to use CPAP

## 2022-05-22 NOTE — Plan of Care (Signed)
Patient is alert, oriented, and able to voice needs. Independent with ADLs. Patient is sitting upright in bed audibly moaning and groaning. Reports she is having 9/10 left abdominal pain secondary to gastroparesis. PRN pain medication and Tums given as ordered. Declining food intake at this time. Writer requested order for PRN anti-emetic medication to help relieve patient's gastric discomfort. Call bell within reach. Will continue to monitor.   Problem: Education: Goal: Knowledge of General Education information will improve Description: Including pain rating scale, medication(s)/side effects and non-pharmacologic comfort measures Outcome: Progressing   Problem: Health Behavior/Discharge Planning: Goal: Ability to manage health-related needs will improve Outcome: Progressing   Problem: Clinical Measurements: Goal: Ability to maintain clinical measurements within normal limits will improve Outcome: Progressing Goal: Will remain free from infection Outcome: Progressing Goal: Diagnostic test results will improve Outcome: Progressing Goal: Respiratory complications will improve Outcome: Progressing Goal: Cardiovascular complication will be avoided Outcome: Progressing   Problem: Activity: Goal: Risk for activity intolerance will decrease Outcome: Progressing   Problem: Nutrition: Goal: Adequate nutrition will be maintained Outcome: Progressing   Problem: Coping: Goal: Level of anxiety will decrease Outcome: Progressing   Problem: Elimination: Goal: Will not experience complications related to bowel motility Outcome: Progressing Goal: Will not experience complications related to urinary retention Outcome: Progressing   Problem: Pain Managment: Goal: General experience of comfort will improve Outcome: Progressing   Problem: Safety: Goal: Ability to remain free from injury will improve Outcome: Progressing   Problem: Skin Integrity: Goal: Risk for impaired skin integrity  will decrease Outcome: Progressing   Problem: Education: Goal: Ability to describe self-care measures that may prevent or decrease complications (Diabetes Survival Skills Education) will improve Outcome: Progressing Goal: Individualized Educational Video(s) Outcome: Progressing   Problem: Coping: Goal: Ability to adjust to condition or change in health will improve Outcome: Progressing   Problem: Fluid Volume: Goal: Ability to maintain a balanced intake and output will improve Outcome: Progressing   Problem: Health Behavior/Discharge Planning: Goal: Ability to identify and utilize available resources and services will improve Outcome: Progressing Goal: Ability to manage health-related needs will improve Outcome: Progressing   Problem: Metabolic: Goal: Ability to maintain appropriate glucose levels will improve Outcome: Progressing   Problem: Nutritional: Goal: Progress toward achieving an optimal weight will improve Outcome: Progressing   Problem: Tissue Perfusion: Goal: Adequacy of tissue perfusion will improve Outcome: Progressing   Problem: Education: Goal: Ability to demonstrate management of disease process will improve Outcome: Progressing Goal: Ability to verbalize understanding of medication therapies will improve Outcome: Progressing Goal: Individualized Educational Video(s) Outcome: Progressing   Problem: Cardiac: Goal: Ability to achieve and maintain adequate cardiopulmonary perfusion will improve Outcome: Progressing

## 2022-05-22 NOTE — Progress Notes (Signed)
Progress Note   Patient: Shirley [name] RSW:[id number] DOB: 09-04-1975 DOA: 05/16/2022     3 DOS: the patient was seen and examined on 05/22/2022   Brief hospital course: Mrs. Durango was admitted to the hospital with the working diagnosis of decompensated heart failure.   47 yo female with the past medical history of heart failure, CKD, coronary artery disease, CVA, T2DM, DVT and bilateral PE who presented with dyspnea, chest pain and lower extremity edema. Reported one week of dyspnea and chest pressure, along with bilaterl lower extremity edema. She has been not compliant with her medications. On her initial physical examination her blood pressure was 178/114, HR 106, RR 24 and 02 saturation 94%, lungs with bilateral rales with increase work of breathing, heart with S1 and S2 present and rhythmic, abdomen with no distention, positive lower extremity edema.  Na 135, K 3,9 CL 105 bicarbonate 23, glucose 65, bun 28 cr 2,47  BNP 131.7  High sensitive troponin 48 and 44  Wbc 10,3 hgb 11.0 plt 382   Sars covid 19 negative Influenza A and B negative  Toxicology negative   Chest radiograph with cardiomegaly with bilateral hilar vascular congestion and some cephalization of the vasculature, small left pleural effusion.   EKG 108 bpm, normal axis, normal intervals, sinus rhythm with poor  R R wave progression, with no significant ST segment or T wave changes.   Patient was placed on furosemide for diuresis.   01/26 volume status has improved. Pending improvement in renal function.  01/27 renal function still not back at baseline.  01/28 worsening renal failure and edema.  01/29 volume improving, possible new baseline serum cr at 3. Will consult nephrology.   Assessment and Plan: * Acute on chronic systolic CHF (congestive heart failure) (HCC) Echocardiogram with mild reduction in LV systolic function, EF 45 to 50%, global hypokinesis, mild LVH, RV systolic function preserved, RVSP 35,9 LA  with moderate dilatation, small pericardial effusion. Mild to moderate MR and TR.   Urine output is 0,938 ml Systolic blood pressure 182 to 153 mmHg.   Continue blood pressure control with amlodipine and carvedilol.  After load reduction with hydralazine and isosorbide.  Resume diuresis with furosemide IV 60 mg.   Limited pharmacologic options due to reduced GFR.   CAD S/P percutaneous coronary angioplasty No chest pain, troponin elevation due to heart failure decompensation. ACS ruled out.    Acute kidney injury superimposed on chronic kidney disease (HCC) CKD stage 3b, likely hypertensive nephropathy.   Her volume status has been improving with diuresis, not back yet to baseline.  Suspect progressive CKD with new baseline of 3.   Renal US no hydronephrosis with echogenic kidneys.  In April she had proteinuria >300   Rena function today with serum cr at 3,41 with K at 3,8 and serum bicarbonate 21. Na 135 BUN 45.   Continue furosemide IV for diuresis. Consult nephrology, she will need close follow up as outpatient.   Severe hypertension Blood pressure control with carvedilol, hydralazine and isosorbide,   Type 2 diabetes mellitus with hyperlipidemia (HCC) Fasting glucose is 140 Glucose has been stable, off insulin therapy. Will order home glucometer.  Dyslipidemia   Gastroparesis, add zofran and compazine for symptom control.   History of pulmonary embolism Hx of DVT.  Continue anticoagulation with apixaban.         Subjective: Patient is feeling nauseated today with no vomiting, her edema has improved but not back to baseline,. No chest pain  Physical Exam: Vitals:   05/21/22 1936 05/22/22 0500 05/22/22 0815 05/22/22 0818  BP: 129/76 (!) 145/87 (!) 153/101 (!) 153/101  Pulse:   98 98  Resp: '19 17 18 18  '$ Temp: 98.7 F (37.1 C) 98.7 F (37.1 C) 98.8 F (37.1 C) 98.8 F (37.1 C)  TempSrc: Oral Oral Oral Oral  SpO2: 96% 96% 100% 100%  Weight:  99.2 kg     Height:       Neurology awake and alert, deconditioned ENT with mild pallor Cardiovascular with S1 and S2 present and rhythmic with no gallops, rubs or murmurs Respiratory with no rales or wheezing Abdomen with no distention  Positive lower extremity edema distal + to ++ pitting  Data Reviewed:    Family Communication: no family at the bedside   Disposition: Status is: Inpatient Remains inpatient appropriate because: renal failure   Planned Discharge Destination: Home      Author: Tawni Millers, MD 05/22/2022 11:20 AM  For on call review [email or web address].

## 2022-05-23 ENCOUNTER — Other Ambulatory Visit (HOSPITAL_COMMUNITY): Payer: Self-pay

## 2022-05-23 DIAGNOSIS — I10 Essential (primary) hypertension: Secondary | ICD-10-CM | POA: Diagnosis not present

## 2022-05-23 DIAGNOSIS — N179 Acute kidney failure, unspecified: Secondary | ICD-10-CM | POA: Diagnosis not present

## 2022-05-23 DIAGNOSIS — I5023 Acute on chronic systolic (congestive) heart failure: Secondary | ICD-10-CM | POA: Diagnosis not present

## 2022-05-23 DIAGNOSIS — I2510 Atherosclerotic heart disease of native coronary artery without angina pectoris: Secondary | ICD-10-CM | POA: Diagnosis not present

## 2022-05-23 LAB — RENAL FUNCTION PANEL
Albumin: 2 g/dL — ABNORMAL LOW (ref 3.5–5.0)
Anion gap: 6 (ref 5–15)
BUN: 48 mg/dL — ABNORMAL HIGH (ref 6–20)
CO2: 22 mmol/L (ref 22–32)
Calcium: 8.1 mg/dL — ABNORMAL LOW (ref 8.9–10.3)
Chloride: 109 mmol/L (ref 98–111)
Creatinine, Ser: 3.39 mg/dL — ABNORMAL HIGH (ref 0.44–1.00)
GFR, Estimated: 16 mL/min — ABNORMAL LOW (ref >60)
Glucose, Bld: 91 mg/dL (ref 70–99)
Phosphorus: 4.3 mg/dL (ref 2.5–4.6)
Potassium: 3.9 mmol/L (ref 3.5–5.1)
Sodium: 137 mmol/L (ref 135–145)

## 2022-05-23 MED ORDER — LANCET DEVICE MISC
1.0000 | Freq: Three times a day (TID) | 0 refills | Status: AC
  Filled 2022-05-23: qty 1, 30d supply, fill #0

## 2022-05-23 MED ORDER — GLUCOSE BLOOD VI STRP
1.0000 | ORAL_STRIP | Freq: Three times a day (TID) | 0 refills | Status: AC
  Filled 2022-05-23: qty 100, 30d supply, fill #0

## 2022-05-23 MED ORDER — ISOSORBIDE MONONITRATE ER 30 MG PO TB24
30.0000 mg | ORAL_TABLET | Freq: Every day | ORAL | 0 refills | Status: DC
  Filled 2022-05-23: qty 30, 30d supply, fill #0

## 2022-05-23 MED ORDER — FUROSEMIDE 20 MG PO TABS
60.0000 mg | ORAL_TABLET | Freq: Every day | ORAL | 0 refills | Status: DC
  Filled 2022-05-23: qty 90, 30d supply, fill #0

## 2022-05-23 MED ORDER — APIXABAN 5 MG PO TABS: 5.0000 mg | ORAL_TABLET | Freq: Two times a day (BID) | ORAL | 0 refills | Status: DC

## 2022-05-23 MED ORDER — DAPAGLIFLOZIN PROPANEDIOL 10 MG PO TABS: 10.0000 mg | ORAL_TABLET | Freq: Every day | ORAL | Status: DC

## 2022-05-23 MED ORDER — BLOOD PRESSURE KIT
1.0000 | PACK | Freq: Every day | 0 refills | Status: DC
  Filled 2022-05-23: qty 1, fill #0

## 2022-05-23 MED ORDER — HYDRALAZINE HCL 25 MG PO TABS
25.0000 mg | ORAL_TABLET | Freq: Three times a day (TID) | ORAL | 0 refills | Status: DC
  Filled 2022-05-23: qty 90, 30d supply, fill #0

## 2022-05-23 MED ORDER — FUROSEMIDE 40 MG PO TABS: 60.0000 mg | ORAL_TABLET | Freq: Every day | ORAL | Status: DC

## 2022-05-23 MED ORDER — ONETOUCH DELICA PLUS LANCET30G MISC
1.0000 | Freq: Three times a day (TID) | 0 refills | Status: AC
  Filled 2022-05-23: qty 100, 30d supply, fill #0

## 2022-05-23 MED ORDER — ONETOUCH VERIO FLEX SYSTEM W/DEVICE KIT
1.0000 | PACK | Freq: Three times a day (TID) | 0 refills | Status: AC
  Filled 2022-05-23: qty 1, 30d supply, fill #0

## 2022-05-23 NOTE — TOC Initial Note (Signed)
Transition of Care [facility]) - Initial/Assessment Note    Patient Details  Name: Shirley [name] MRN: [id number] Date of Birth: [birth year]  Transition of Care [facility]) CM/SW Contact:    Zenon Mayo, RN Phone Number: 05/23/2022, 11:52 AM  Clinical Narrative:                 Patient is for dc today, TOC to do meds.  Patient states she will call her self a cab to get home.  NCM asked MD to print off the script for the bp cuff so she can take to a local pharmacy to get the bp cuff.   Expected Discharge Plan: Home/Self Care Barriers to Discharge: No Barriers Identified   Patient Goals and CMS Choice Patient states their goals for this hospitalization and ongoing recovery are:: return home   Choice offered to / list presented to : NA      Expected Discharge Plan and Services In-house Referral: NA Discharge Planning Services: CM Consult Post Acute Care Choice: NA Living arrangements for the past 2 months: Single Family Home Expected Discharge Date: 05/23/22               DME Arranged: N/A DME Agency: NA       HH Arranged: NA          Prior Living Arrangements/Services Living arrangements for the past 2 months: Single Family Home Lives with:: Spouse Patient language and need for interpreter reviewed:: Yes Do you feel safe going back to the place where you live?: Yes      Need for Family Participation in Patient Care: No (Comment) Care giver support system in place?: Yes (comment)   Criminal Activity/Legal Involvement Pertinent to Current Situation/Hospitalization: No - Comment as needed  Activities of Daily Living      Permission Sought/Granted                  Emotional Assessment   Attitude/Demeanor/Rapport: Engaged Affect (typically observed): Appropriate Orientation: : Oriented to Self, Oriented to Place, Oriented to  Time, Oriented to Situation Alcohol / Substance Use: Not Applicable Psych Involvement: No (comment)  Admission diagnosis:   Shortness of breath [R06.02] Bilateral lower extremity edema [R60.0] Chest pain, unspecified type [R07.9] New onset of congestive heart failure (HCC) [I50.9] Hypertension, unspecified type [I10] CHF (congestive heart failure) ([place]) [I50.9] Heart failure ([place]) [I50.9] Patient Active Problem List   Diagnosis Date Noted   Heart failure ([place]) 05/19/2022   Acute kidney injury superimposed on chronic kidney disease ([place]) 05/18/2022   Acute on chronic systolic CHF (congestive heart failure) ([place]) 05/17/2022   New onset of congestive heart failure ([place]) 05/16/2022   Chest pain 03/21/2022   CAD S/P percutaneous coronary angioplasty 03/21/2022   Acute-on-chronic kidney injury ([place]) 03/21/2022   Acute pulmonary embolism ([place]) 03/21/2022   Sepsis ([place]) 04/17/2021   Conjunctivitis 04/17/2021   Abscess 04/16/2021   Abscess of bursa of right foot    Diabetic foot infection ([place]) 04/06/2020   Cellulitis of foot, right    Puncture wound of right foot    Sepsis due to pneumonia ([place]) 11/24/2019   Thrombocytosis 11/24/2019   Chronic pain 11/24/2019   History of pulmonary embolism 10/10/2017   Chronic anticoagulation 10/10/2017   GERD (gastroesophageal reflux disease) 10/10/2017   Leukocytosis 10/10/2017   Prolonged QT interval 10/10/2017   Nausea & vomiting 09/20/2017   Hypertensive urgency 09/20/2017   Intractable nausea and vomiting 08/08/2017   Chronic maxillary sinusitis 01/02/2017   Acute blood  loss anemia 11/13/2016   Menorrhagia 11/13/2016   Bilateral pulmonary embolism ([place]) 10/30/2016   Acute DVT (deep venous thrombosis) (HCC) 10/30/2016   Nausea and vomiting 09/23/2016   Type 2 diabetes mellitus with hyperglycemia ([place]) 04/07/2016   Diabetic gastroparesis ([place]) 04/06/2016   Dehydration 03/27/2015   Gastroparesis 11/11/2014   Hematemesis 10/13/2014   DKA (diabetic ketoacidoses) 10/13/2014   Abdominal pain, chronic, left lower quadrant    Diabetic gastroparesis associated with  type 2 diabetes mellitus ([place]) 07/13/2246   History of Helicobacter pylori infection 04/22/2014   Vaginal discharge 02/18/2014   Unspecified constipation 07/21/2013   Tinea corporis 07/21/2013   Intractable vomiting 04/29/2013   Dysmenorrhea 04/22/2013   UTI (urinary tract infection) 07/15/2012   Headache(784.0) 02/08/2012   Health care maintenance 01/22/2012   Chronic hepatitis B ([place]) 03/07/2011   History of leukocytosis 04/06/2010   THROMBOCYTOSIS 04/06/2010   Polysubstance abuse ([place]) 02/23/2010   Iron deficiency anemia 11/22/2009   PERIPHERAL NEUROPATHY 10/01/2009   Hyperlipidemia 08/30/2009   Diabetic polyneuropathy ([place]) 08/30/2009   Hidradenitis (recurrent boils) 07/07/2008   Depression 12/27/2007   Abdominal pain, left lower quadrant 11/21/2007   FIBROMYALGIA 10/30/2007   BACK PAIN 04/01/2007   OBSTRUCTIVE SLEEP APNEA 01/17/2007   ANXIETY DEPRESSION 06/27/2006   Chronic ischemic heart disease 06/15/2006   OBESITY, MORBID 05/15/2006   MIGRAINE HEADACHE 05/15/2006   Asthma 05/15/2006   Severe hypertension 01/16/2006   IRREGULAR MENSTRUATION 01/16/2006   PEDAL EDEMA 01/16/2006   Type 2 diabetes mellitus with hyperlipidemia ([place]) 01/16/1989   PCP:  Selena Batten, NP Pharmacy:   [facility] [phone] - [place], [place] [address] [place] [address] Phone: [phone] Fax: [place] [address] [address] Phone: [phone] Fax: [phone]     Social Determinants of Health (SDOH) Social History: SDOH Screenings   Food Insecurity: No Food Insecurity (05/18/2022)  Housing: Low Risk  (05/18/2022)  Transportation Needs: No Transportation Needs (05/18/2022)  Utilities: Not At Risk (03/22/2022)  Alcohol Screen: Low Risk  (05/18/2022)  Depression (PHQ2-9): Medium Risk (11/28/2018)  Financial Resource Strain: Low Risk  (05/18/2022)   Tobacco Use: Medium Risk (05/18/2022)   SDOH Interventions: Food Insecurity Interventions: Intervention Not Indicated Housing Interventions: Intervention Not Indicated Transportation Interventions: Intervention Not Indicated Alcohol Usage Interventions: Intervention Not Indicated (Score <7) Financial Strain Interventions: Intervention Not Indicated   Readmission Risk Interventions    05/23/2022   11:50 AM 03/24/2022    3:38 PM  Readmission Risk Prevention Plan  Transportation Screening Complete Complete  Medication Review Press photographer) Complete Referral to Pharmacy  PCP or Specialist appointment within 3-5 days of discharge Complete   HRI or [place] Complete Complete  SW Recovery Care/Counseling Consult  Complete  Palliative Care Screening Not Applicable Not [place] Not Applicable Not Applicable

## 2022-05-23 NOTE — Discharge Summary (Addendum)
Physician Discharge Summary   Patient: Shirley [name] MRN: [id number] DOB: August 09, 1975  Admit date:     05/16/2022  Discharge date: 05/23/22  Discharge Physician: Jimmy Picket Sissy Goetzke   PCP: Selena Batten, NP   Recommendations at discharge:    Continue diuresis with furosemide 60 mg po daily Follow up with nephrology as outpatient, possibly starting SGLT 2 inh. Increased hydralazine and added isosorbide for blood pressure control. Limited pharmacology options for heart failure due to reduced GFR Follow up renal function as outpatient in 7 days. Follow up with Catalina Gravel NP in 7 to 10 days. Follow up with Cardiology as outpatient.   Discharge Diagnoses: Principal Problem:   Acute on chronic systolic CHF (congestive heart failure) (HCC) Active Problems:   CAD S/P percutaneous coronary angioplasty   Acute kidney injury superimposed on chronic kidney disease (HCC)   Severe hypertension   Type 2 diabetes mellitus with hyperlipidemia ([place])   History of pulmonary embolism  Resolved Problems:   * No resolved hospital problems. [facility] Course: Shirley [name] was admitted to the hospital with the working diagnosis of decompensated heart failure and worsening renal failure.    47 yo female with the past medical history of heart failure, CKD, coronary artery disease, CVA, T2DM, DVT and bilateral PE who presented with dyspnea, chest pain and lower extremity edema. Reported one week of dyspnea and chest pressure, along with bilaterl lower extremity edema. She has been not compliant with her medications. On her initial physical examination her blood pressure was 178/114, HR 106, RR 24 and 02 saturation 94%, lungs with bilateral rales with increase work of breathing, heart with S1 and S2 present and rhythmic, abdomen with no distention, positive lower extremity edema.  Na 135, K 3,9 CL 105 bicarbonate 23, glucose 65, bun 28 cr 2,47  BNP 131.7  High sensitive troponin 48 and 44  Wbc 10,3  hgb 11.0 plt 382   Sars covid 19 negative Influenza A and B negative  Toxicology negative   Chest radiograph with cardiomegaly with bilateral hilar vascular congestion and some cephalization of the vasculature, small left pleural effusion.   EKG 108 bpm, normal axis, normal intervals, sinus rhythm with poor  R R wave progression, with no significant ST segment or T wave changes.   Patient was placed on furosemide for diuresis.   01/26 volume status has improved. Pending improvement in renal function.  01/27 renal function still not back at baseline.  01/28 worsening renal failure and edema.  01/29 volume improving, possible new baseline serum cr at 3. Will consult nephrology.  01/30 edema continue to improve, renal function has stabilized at a serum cr of 3.39 to 3.41.  Plan to follow up as outpatient with nephrology.   Assessment and Plan: * Acute on chronic systolic CHF (congestive heart failure) (HCC) Echocardiogram with mild reduction in LV systolic function, EF 45 to 50%, global hypokinesis, mild LVH, RV systolic function preserved, RVSP 35,9 LA with moderate dilatation, small pericardial effusion. Mild to moderate MR and TR.   Pulmonary hypertension Acute on chronic core pulmonale.   Continue blood pressure control with amlodipine and carvedilol.  After load reduction with hydralazine and isosorbide.  Continue loop diuretic 60 mg po daily.    Limited pharmacologic options due to reduced GFR.   CAD S/P percutaneous coronary angioplasty No chest pain, troponin elevation due to heart failure decompensation. ACS ruled out.    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   Renal US no hydronephrosis with echogenic kidneys.  Positive proteinuria with urinary protein/creatinine ratio of 3,96 nephrotic range.   Patient with progressive renal disease, likely her new baseline is around  3,0 to 3,5. Plan to continue diuresis with furosemide and follow up as outpatient with nephrology.   Severe hypertension Blood pressure control with carvedilol, hydralazine and isosorbide,   Type 2 diabetes mellitus with hyperlipidemia (HCC) Glucose has been stable, off insulin therapy. Will order home glucometer.  Dyslipidemia   Gastroparesis, add zofran and compazine for symptom control.   Continue with statin therapy.   History of pulmonary embolism Hx of DVT.  Continue anticoagulation with apixaban.          Consultants: nephrology  Procedures performed: none   Disposition: Home Diet recommendation:  Cardiac and Carb modified diet DISCHARGE MEDICATION: Patient not taking pyridostigmine, I was not able to find indication in the medical record.  Allergies as of 05/23/2022       Reactions   Lisinopril Nausea Only, Swelling   Morphine And Related Itching, Swelling        Medication List     STOP taking these medications    metoCLOPramide 5 MG tablet Commonly known as: Reglan   pantoprazole 40 MG tablet Commonly known as: PROTONIX   pyridostigmine 60 MG tablet Commonly known as: MESTINON       TAKE these medications    acetaminophen 500 MG tablet Commonly known as: TYLENOL Take 1,000 mg by mouth every 6 (six) hours as needed for mild pain, moderate pain or headache.   albuterol 108 (90 Base) MCG/ACT inhaler Commonly known as: VENTOLIN HFA Inhale 2 puffs into the lungs every 4 (four) hours as needed for wheezing or shortness of breath.   amLODipine 10 MG tablet Commonly known as: NORVASC Take 1 tablet (10 mg total) by mouth daily.   apixaban 5 MG Tabs tablet Commonly known as: ELIQUIS Take 1 tablet (5 mg total) by mouth 2 (two) times daily.   Aspirin Low Dose 81 MG chewable tablet Generic drug: aspirin Chew 1 tablet (81 mg total) by mouth daily.   atorvastatin 40 MG tablet Commonly known as: LIPITOR Take 1 tablet (40 mg total) by mouth  daily.   Blood Pressure Kit 1 Application by Does not apply route daily at 12 noon.   carvedilol 25 MG tablet Commonly known as: COREG Take 1 tablet (25 mg total) by mouth 2 (two) times daily with a meal.   cyclobenzaprine 10 MG tablet Commonly known as: FLEXERIL Take 10 mg by mouth 3 (three) times daily as needed for muscle spasms.   dicyclomine 10 MG capsule Commonly known as: BENTYL Take 1 capsule (10 mg total) by mouth 4 (four) times daily -  before meals and at bedtime.   diphenhydrAMINE 25 MG tablet Commonly known as: BENADRYL Take 25 mg by mouth every 6 (six) hours as needed for allergies.   famotidine 20 MG tablet Commonly known as: PEPCID Take 20 mg by mouth every evening.   furosemide 20 MG tablet Commonly known as: LASIX Take 3 tablets (60 mg total) by mouth daily. Start taking on: May 24, 2022   gabapentin 400 MG capsule Commonly known as: NEURONTIN Take 1 capsule (400 mg total) by mouth daily at 6 (six) AM.   hydrALAZINE 25 MG tablet Commonly known as: APRESOLINE Take 1 tablet (25 mg total) by mouth every 8 (eight) hours. What changed:  medication  strength how much to take when to take this additional instructions   INSULIN SYRINGE .5CC/28G 28G X 1/2" 0.5 ML Misc Please provide 1 month supply   isosorbide mononitrate 30 MG 24 hr tablet Commonly known as: IMDUR Take 1 tablet (30 mg total) by mouth daily. Start taking on: May 24, 2022   Lancet Device Misc Use in the morning, at noon, and at bedtime.   linaclotide 145 MCG Caps capsule Commonly known as: LINZESS Take 145 mcg by mouth daily as needed (for constipation).   meclizine 25 MG tablet Commonly known as: ANTIVERT Take 1 tablet (25 mg total) by mouth 3 (three) times daily as needed for dizziness.   Mounjaro 7.5 MG/0.5ML Pen Generic drug: tirzepatide Inject 7.5 mg into the skin every Monday.   omeprazole 40 MG capsule Commonly known as: PRILOSEC Take 40 mg by mouth at  bedtime.   ondansetron 4 MG disintegrating tablet Commonly known as: ZOFRAN-ODT Take 4 mg by mouth daily as needed for nausea or vomiting (DISSOLVE ORALLY).   OneTouch Delica Lancets 98P Misc Use to check blood sugar 3 times daily. Dx codes: E11.8, E11.65 What changed: Another medication with the same name was added. Make sure you understand how and when to take each.   OneTouch Delica Plus [id number] Misc Use in the morning, at noon, and at bedtime. What changed: You were already taking a medication with the same name, and this prescription was added. Make sure you understand how and when to take each.   OneTouch Verio Flex System w/Device Kit Use in the morning, at noon, and at bedtime.   OneTouch Verio test strip Generic drug: glucose blood Use as instructed to check blood sugar 3 times daily. Dx codes: E11.8, E11.65 What changed: Another medication with the same name was added. Make sure you understand how and when to take each.   glucose blood test strip Use 1 each in the morning, at noon, and at bedtime. What changed: You were already taking a medication with the same name, and this prescription was added. Make sure you understand how and when to take each.   oxyCODONE-acetaminophen 10-325 MG tablet Commonly known as: PERCOCET Take 1 tablet by mouth 3 (three) times daily as needed for pain.   Pen Needles 3/16" 31G X 5 MM Misc 1 Device by Does not apply route 4 (four) times daily - after meals and at bedtime.   promethazine 25 MG tablet Commonly known as: PHENERGAN Take 25 mg by mouth every 6 (six) hours as needed for nausea or vomiting.   senna 8.6 MG Tabs tablet Commonly known as: SENOKOT Take 1 tablet (8.6 mg total) by mouth 2 (two) times daily. What changed:  when to take this reasons to take this        Follow-up [place] and [place]. Go in 9 day(s).   Specialty: Cardiology Why: Hospital follow up 05/31/2022  @ 3 pm PLEASE bring a current medication list to appointment FREE valet parking, Entrance C, off [employer] information: [address] [id number] [place] [phone]               Discharge Exam: Filed Weights   05/21/22 0417 05/22/22 0500 05/23/22 0458  Weight: 98.8 kg 99.2 kg 99 kg   BP (!) 143/86 (BP Location: Right Arm)   Pulse 92   Temp 99 F (37.2 C) (Oral)   Resp 18   Ht  $'5\' 7"'A$  (1.702 m)   Wt 99 kg   SpO2 100%   BMI 34.19 kg/m   Patient with improvement in dyspnea and edema.  Neurology awake and alert ENT with mild pallor Cardiovascular with S1 and S2 present and rhythmic with no gallops, rubs or murmurs Respiratory with no rales or wheezing Abdomen with no distention  Lower extremity edema trace, more on her left foot.   Condition at discharge: stable  The results of significant diagnostics from this hospitalization (including imaging, microbiology, ancillary and laboratory) are listed below for reference.   Imaging Studies: US RENAL  Result Date: 05/22/2022 CLINICAL DATA:  Acute renal injury EXAM: RENAL / URINARY TRACT ULTRASOUND COMPLETE COMPARISON:  03/21/2022 FINDINGS: Right Kidney: Renal measurements: 10.0 x 5.3 x 5.3 cm. = volume: 148 mL. Echogenicity within normal limits. No mass or hydronephrosis visualized. Left Kidney: Renal measurements: 10.2 x 5.8 x 4.5 cm. = volume: 136 mL. Echogenicity within normal limits. No mass or hydronephrosis visualized. Bladder: Appears normal for degree of bladder distention. Other: None. IMPRESSION: No acute abnormality noted. Electronically Signed   By: Inez Catalina M.D.   On: 05/22/2022 19:11   DG Chest 1 View  Result Date: 05/18/2022 CLINICAL DATA:  Congestive heart failure. EXAM: CHEST  1 VIEW COMPARISON:  May 16, 2022. FINDINGS: Stable cardiomegaly with mild central pulmonary vascular congestion. Mild right basilar atelectasis or infiltrate is noted  laterally. Bony thorax is unremarkable. IMPRESSION: Stable cardiomegaly with mild central pulmonary vascular congestion. Mild right basilar atelectasis or infiltrate is noted. Electronically Signed   By: Marijo Conception M.D.   On: 05/18/2022 08:16   DG Chest 2 View  Result Date: 05/16/2022 CLINICAL DATA:  Chest pain and shortness of breath EXAM: CHEST - 2 VIEW COMPARISON:  03/20/2022 and older FINDINGS: Enlarged cardiopericardial silhouette with vascular congestion. No consolidation or pneumothorax. Question tiny left effusion. IMPRESSION: Enlarged cardiopericardial silhouette with vascular congestion. Tiny left effusion Electronically Signed   By: Jill Side M.D.   On: 05/16/2022 18:28    Microbiology: Results for orders placed or performed during the hospital encounter of 05/16/22  Resp panel by RT-PCR (RSV, Flu A&B, Covid) Anterior Nasal Swab     Status: None   Collection Time: 05/16/22 10:24 PM   Specimen: Anterior Nasal Swab  Result Value Ref Range Status   SARS Coronavirus 2 by RT PCR NEGATIVE NEGATIVE Final    Comment: (NOTE) SARS-CoV-2 target nucleic acids are NOT DETECTED.  The SARS-CoV-2 RNA is generally detectable in upper respiratory specimens during the acute phase of infection. The lowest concentration of SARS-CoV-2 viral copies this assay can detect is 138 copies/mL. A negative result does not preclude SARS-Cov-2 infection and should not be used as the sole basis for treatment or other patient management decisions. A negative result may occur with  improper specimen collection/handling, submission of specimen other than nasopharyngeal swab, presence of viral mutation(s) within the areas targeted by this assay, and inadequate number of viral copies(<138 copies/mL). A negative result must be combined with clinical observations, patient history, and epidemiological information. The expected result is Negative.  Fact Sheet for Patients:   EntrepreneurPulse.com.au  Fact Sheet for Healthcare Providers:  IncredibleEmployment.be  This test is no t yet approved or cleared by the [place] FDA and  has been authorized for detection and/or diagnosis of SARS-CoV-2 by FDA under an Emergency Use Authorization (EUA). This EUA will remain  in effect (meaning this test can be used) for the duration of the  COVID-19 declaration under Section 564(b)(1) of the Act, 21 U.S.C.section 360bbb-3(b)(1), unless the authorization is terminated  or revoked sooner.       Influenza A by PCR NEGATIVE NEGATIVE Final   Influenza B by PCR NEGATIVE NEGATIVE Final    Comment: (NOTE) The Xpert Xpress SARS-CoV-2/FLU/RSV plus assay is intended as an aid in the diagnosis of influenza from Nasopharyngeal swab specimens and should not be used as a sole basis for treatment. Nasal washings and aspirates are unacceptable for Xpert Xpress SARS-CoV-2/FLU/RSV testing.  Fact Sheet for Patients: EntrepreneurPulse.com.au  Fact Sheet for Healthcare Providers: IncredibleEmployment.be  This test is not yet approved or cleared by the [place] FDA and has been authorized for detection and/or diagnosis of SARS-CoV-2 by FDA under an Emergency Use Authorization (EUA). This EUA will remain in effect (meaning this test can be used) for the duration of the COVID-19 declaration under Section 564(b)(1) of the Act, 21 U.S.C. section 360bbb-3(b)(1), unless the authorization is terminated or revoked.     Resp Syncytial Virus by PCR NEGATIVE NEGATIVE Final    Comment: (NOTE) Fact Sheet for Patients: EntrepreneurPulse.com.au  Fact Sheet for Healthcare Providers: IncredibleEmployment.be  This test is not yet approved or cleared by the [place] FDA and has been authorized for detection and/or diagnosis of SARS-CoV-2 by FDA under an Emergency Use  Authorization (EUA). This EUA will remain in effect (meaning this test can be used) for the duration of the COVID-19 declaration under Section 564(b)(1) of the Act, 21 U.S.C. section 360bbb-3(b)(1), unless the authorization is terminated or revoked.  Performed at [facility], [address], [place], [place] [address]   MRSA Next Gen by PCR, Nasal     Status: Abnormal   Collection Time: 05/18/22  9:09 AM   Specimen: Nasal Mucosa; Nasal Swab  Result Value Ref Range Status   MRSA by PCR Next Gen DETECTED (A) NOT DETECTED Final    Comment: RESULT CALLED TO, READ BACK BY AND VERIFIED WITH: RN [email or web address] [id number] '@1521'$  FH (NOTE) The GeneXpert MRSA Assay (FDA approved for NASAL specimens only), is one component of a comprehensive MRSA colonization surveillance program. It is not intended to diagnose MRSA infection nor to guide or monitor treatment for MRSA infections. Test performance is not FDA approved in patients less than 21 years old. Performed at [facility], [place] [address] [place], [place] [address]     Labs: CBC: Recent Labs  Lab 05/16/22 2004  WBC 10.3  HGB 11.0*  HCT 32.4*  MCV 75.9*  PLT 761   Basic Metabolic Panel: Recent Labs  Lab 05/19/22 0134 05/20/22 0031 05/21/22 0851 05/22/22 0043 05/23/22 0123  NA 136 135 135 135 137  K 3.9 4.0 3.8 3.8 3.9  CL 109 107 107 107 109  CO2 22 20* 18* 21* 22  GLUCOSE 99 107* 106* 140* 91  BUN 30* 36* 46* 45* 48*  CREATININE 2.79* 3.13* 3.27* 3.41* 3.39*  CALCIUM 8.2* 8.1* 8.0* 8.0* 8.1*  MG 1.8  --   --   --   --   PHOS  --   --   --   --  4.3   Liver Function Tests: Recent Labs  Lab 05/23/22 0123  ALBUMIN 2.0*   CBG: Recent Labs  Lab 05/19/22 1124 05/20/22 0606 05/20/22 1112 05/20/22 1701 05/21/22 1122  GLUCAP 112* 82 113* 131* 129*    Discharge time spent: greater than 30 minutes.  Signed: Tawni Millers, MD [facility] 05/23/2022

## 2022-05-23 NOTE — Progress Notes (Signed)
PT Cancellation Note  Patient Details Name: Shirley [name] MRN: [id number] DOB: November 24, 1975   Cancelled Treatment:    Reason Eval/Treat Not Completed: PT screened, no needs identified, will sign off. Patient states she is independent in the room. Denies needs. OT notes reveal she was independently in bathroom on her arrival. Will sign off.    Beyounce Dickens 05/23/2022, 9:12 AM

## 2022-05-23 NOTE — Progress Notes (Signed)
Assumed care at 0700, Aox4, tele monitored. All needs made known. Discharge paperwork and education complete. All questions answered. PIV and tele removed. Med to be delivered from pharmacy. Belongings gathered, pt called own cab, awaiting transport.

## 2022-05-23 NOTE — Progress Notes (Signed)
   Heart Failure Stewardship Pharmacist Progress Note   PCP: Selena Batten, NP PCP-Cardiologist: Mertie Moores, MD    HPI:  47 yo F with PMH of PE/DVT, CAD, T2DM, HLD, CKD, obesity, polysubstance use, OSA, diabetic gastroparesis, and DKA.  Admitted from 03/20/22 to 03/24/22 with acute PE and DVT now taking Eliquis. ECHO at that time showed LVEF 45-50%, mild concentric LVH, RV normal.   Presented to the ED on 1/23 with shortness of breath, weight gain, orthopnea, chest discomfort, and LE edema. CXR with stable cardiomegaly and mild central pulmonary vascular congestion.   Discharge HF Medications: Diuretic: furosemide 60 mg daily Beta Blocker: carvedilol 25 mg BID Other: hydralazine 25 mg TID + Imdur 30 mg daily  Prior to admission HF Medications: Beta blocker: carvedilol 25 mg BID Other: hydralazine 10 mg TID  Pertinent Lab Values: Serum creatinine 3.39, BUN 48, Potassium 3.9, Sodium 137, BNP 131.7, A1c 5.8, Magnesium 1.8  Vital Signs: Weight: 218 lbs (admission weight: 213 lbs) Blood pressure: 140/80s  Heart rate: 90s  I/O: -1L yesterday; net -2.4L  Medication Assistance / Insurance Benefits Check: Does the patient have prescription insurance?  Yes Type of insurance plan: [place] Medicaid  Outpatient Pharmacy:  Prior to admission outpatient pharmacy: [facility] Is the patient willing to use [place] at discharge? Yes Is the patient willing to transition their outpatient pharmacy to utilize a [facility] outpatient pharmacy?   Pending    Assessment: 1. 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 NYHA class III symptoms. - Continue furosemide 60 mg daily at discharge. Strict I/Os and daily weights. Keep K>4 and Mg>2. - Continue carvedilol 25 mg BID  - GDMT limited by CKD - Caution adding SGLT2i with history of DKA - Continue hydralazine 25 mg TID. Continue Imdur 30 mg daily.   Plan: 1) Medication changes recommended at this time: - Discharge today  2) Patient  assistance: - None needed, has [place] Medicaid  3)  Education  - Patient has been educated on current HF medications and potential additions to HF medication regimen - Patient verbalizes understanding that over the next few months, these medication doses may change and more medications may be added to optimize HF regimen - Patient has been educated on basic disease state pathophysiology and goals of therapy   Kerby Nora, PharmD, BCPS Heart Failure Stewardship Pharmacist Phone [phone]

## 2022-05-23 NOTE — TOC Transition Note (Signed)
Transition of Care [facility]) - CM/SW Discharge Note   Patient Details  Name: Shirley [name] MRN: [id number] Date of Birth: [birth year]  [facility] CM/SW Contact:  Zenon Mayo, RN Phone Number: 05/23/2022, 11:54 AM   Clinical Narrative:    Patient is for dc today, TOC to do meds. Patient states she will call her self a cab to get home. NCM asked MD to print off the script for the bp cuff so she can take to a local pharmacy to get the bp cuff.    Final next level of care: Home/Self Care Barriers to Discharge: No Barriers Identified   Patient Goals and CMS Choice   Choice offered to / list presented to : NA  Discharge Placement                         Discharge Plan and Services Additional resources added to the After Visit Summary for   In-house Referral: NA Discharge Planning Services: CM Consult Post Acute Care Choice: NA          DME Arranged: N/A DME Agency: NA       HH Arranged: NA          Social Determinants of Health (SDOH) Interventions SDOH Screenings   Food Insecurity: No Food Insecurity (05/18/2022)  Housing: Low Risk  (05/18/2022)  Transportation Needs: No Transportation Needs (05/18/2022)  Utilities: Not At Risk (03/22/2022)  Alcohol Screen: Low Risk  (05/18/2022)  Depression (PHQ2-9): Medium Risk (11/28/2018)  Financial Resource Strain: Low Risk  (05/18/2022)  Tobacco Use: Medium Risk (05/18/2022)     Readmission Risk Interventions    05/23/2022   11:50 AM 03/24/2022    3:38 PM  Readmission Risk Prevention Plan  Transportation Screening Complete Complete  Medication Review ([place]) Complete Referral to Pharmacy  PCP or Specialist appointment within 3-5 days of discharge Complete   HRI or [place] Complete Complete  SW Recovery Care/Counseling Consult  Complete  Palliative Care Screening Not Applicable Not [place] Not Applicable Not Applicable

## 2022-05-23 NOTE — Care Management Important Message (Signed)
Important Message  Patient Details  Name: Shirley [name] MRN: [id number] Date of Birth: 1975/08/11   Medicare Important Message Given:  Yes     [name] 05/23/2022, 12:03 PM

## 2022-05-23 NOTE — Progress Notes (Addendum)
[facility] Progress Note   47 y.o. female HFrEF, CAD s/p PCI 2008, CVA 2015, DM2, HTN, recent DVT/PE who presents with SOB. Has had bilateral leg swelling, cough, DOE, CP,  orthopnea.  Found to be hypertensive w/  CXR showing pulmonary edema.  Admitted for heart failure exacerbation and has been undergoing diuresis. She only remembers hearing about kidney disease a few months ago. Crt seems to have progressively worsened since 2019 with recent baseline around 2.5. Uas in the past with significant proteinuria. Long standing diabetes.Crt was 2.5 on arrival and has steadily increased since 05/18/22 to now be 3.4 today.   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 BL CKD likely diabetic kidney disease and arterionephrosclerosis -Still w/ e/o volume overload but she is feeling better -Continue with IV diuresis (on '60mg'$  BID) -UPC 3.96 -> she has swelling with ACE-I; can challenge with [place] as outpt with close f/u [facility] to determine if she tolerates. -Continue to monitor daily Cr, Dose meds for GFR -Monitor Daily I/Os, Daily weight  -Maintain MAP>65 for optimal renal perfusion.  -Avoid nephrotoxic medications including NSAIDs -Use synthetic opioids (Fentanyl/Dilaudid) if needed -Currently no indication for HD  -Will need f/u in our office at [place] [phone]. F/u in 2-4 weeks; I've already contacted the office and they will call the pt. Her last f/u at [place] was 09/2021. I don't think she has to remain in the hospital much longer if volume is optimized and crt stabilizes   2) Acute on chronic CHF exacterbation. Reportedly mixed reduced EF and HFpEF. Ef in November was 45-50%. Possibly PE in Nov/Dec has effected her CO. Continue with diuretics. Consider repeat echo/cards involvement   3) CAD: home meds  4) HTN: improving. Cont current meds  5) Uncontrolled Diabetes Mellitus  Type 2 with Hyperglycemia: meds per primary  6) PE: on anticoagulation  Subjective:   Feeling much better; denies f/c/n/v/sob.   Objective:   BP (!) 143/86 (BP Location: Right Arm)   Pulse 92   Temp 99 F (37.2 C) (Oral)   Resp 18   Ht '5\' 7"'$  (1.702 m)   Wt 99 kg   SpO2 100%   BMI 34.19 kg/m   Intake/Output Summary (Last 24 hours) at 05/23/2022 0940 Last data filed at 05/23/2022 0503 Gross per 24 hour  Intake --  Output 2000 ml  Net -2000 ml   Weight change: -0.18 kg  Physical Exam: General: NAD HEENT: no conj pallor CV: RRR, 1+ pitting edema in the bilateral lower extremities up to the knee Lungs: Diminished breath sounds in the bilateral bases but no rales Abd: soft, non-tender, non-distended Skin: no visible lesions or rashes Musculoskeletal: no obvious deformities Neuro: normal speech, no gross focal deficits   Imaging: US RENAL  Result Date: 05/22/2022 CLINICAL DATA:  Acute renal injury EXAM: RENAL / URINARY TRACT ULTRASOUND COMPLETE COMPARISON:  03/21/2022 FINDINGS: Right Kidney: Renal measurements: 10.0 x 5.3 x 5.3 cm. = volume: 148 mL. Echogenicity within normal limits. No mass or hydronephrosis visualized. Left Kidney: Renal measurements: 10.2 x 5.8 x 4.5 cm. = volume: 136 mL. Echogenicity within normal limits. No mass or hydronephrosis visualized. Bladder: Appears normal for degree of bladder distention. Other: None. IMPRESSION: No acute abnormality noted. Electronically Signed   By: Inez Catalina M.D.   On: 05/22/2022 19:11    Labs: BMET Recent Labs  Lab 05/16/22 2004 05/18/22 0111  05/19/22 0134 05/20/22 0031 05/21/22 0851 05/22/22 0043 05/23/22 0123  NA 135 138 136 135 135 135 137  K 3.9 3.8 3.9 4.0 3.8 3.8 3.9  CL 105 109 109 107 107 107 109  CO2 '23 23 22 '$ 20* 18* 21* 22  GLUCOSE 65* 107* 99 107* 106* 140* 91  BUN 29* 27* 30* 36* 46* 45* 48*  CREATININE 2.47* 2.37* 2.79* 3.13* 3.27* 3.41* 3.39*  CALCIUM 9.0 8.4* 8.2* 8.1* 8.0* 8.0* 8.1*  PHOS  --    --   --   --   --   --  4.3   CBC Recent Labs  Lab 05/16/22 2004  WBC 10.3  HGB 11.0*  HCT 32.4*  MCV 75.9*  PLT 382    Medications:     amLODipine  10 mg Oral Daily   apixaban  5 mg Oral BID   atorvastatin  40 mg Oral Daily   carvedilol  25 mg Oral BID WC   Chlorhexidine Gluconate Cloth  6 each Topical Q0600   dicyclomine  10 mg Oral TID AC & HS   famotidine  20 mg Oral QPM   furosemide  60 mg Intravenous Q12H   hydrALAZINE  25 mg Oral Q8H   isosorbide mononitrate  30 mg Oral Daily   mupirocin ointment  1 Application Nasal BID   pantoprazole  40 mg Oral BID   sodium chloride flush  3 mL Intravenous Q12H      Otelia Santee, MD 05/23/2022, 9:40 AM

## 2022-05-30 ENCOUNTER — Telehealth (HOSPITAL_COMMUNITY): Payer: Self-pay

## 2022-05-30 NOTE — Telephone Encounter (Signed)
Called to confirm Heart & Vascular Transitions of Care appointment at 05/31/22. Patient reminded to bring all medications and pill box organizer with them. Confirmed patient has transportation. Gave directions, instructed to utilize [place] parking.  Confirmed appointment prior to ending call.

## 2022-05-31 ENCOUNTER — Ambulatory Visit (HOSPITAL_COMMUNITY)
Admission: RE | Admit: 2022-05-31 | Discharge: 2022-05-31 | Disposition: A | Discharge status: Home / Self Care | Payer: Medicare HMO | Source: Ambulatory Visit | Attending: Cardiology | Admitting: Cardiology

## 2022-05-31 ENCOUNTER — Encounter (HOSPITAL_COMMUNITY): Payer: Self-pay

## 2022-05-31 VITALS — BP 180/102 | HR 95 | Ht 67.0 in | Wt 209.0 lb

## 2022-05-31 DIAGNOSIS — I5022 Chronic systolic (congestive) heart failure: Secondary | ICD-10-CM | POA: Insufficient documentation

## 2022-05-31 DIAGNOSIS — I130 Hypertensive heart and chronic kidney disease with heart failure and stage 1 through stage 4 chronic kidney disease, or unspecified chronic kidney disease: Secondary | ICD-10-CM | POA: Insufficient documentation

## 2022-05-31 DIAGNOSIS — Z7901 Long term (current) use of anticoagulants: Secondary | ICD-10-CM | POA: Insufficient documentation

## 2022-05-31 DIAGNOSIS — I2510 Atherosclerotic heart disease of native coronary artery without angina pectoris: Secondary | ICD-10-CM | POA: Insufficient documentation

## 2022-05-31 DIAGNOSIS — Z833 Family history of diabetes mellitus: Secondary | ICD-10-CM | POA: Insufficient documentation

## 2022-05-31 DIAGNOSIS — I10 Essential (primary) hypertension: Secondary | ICD-10-CM | POA: Diagnosis not present

## 2022-05-31 DIAGNOSIS — I252 Old myocardial infarction: Secondary | ICD-10-CM | POA: Insufficient documentation

## 2022-05-31 DIAGNOSIS — Z86718 Personal history of other venous thrombosis and embolism: Secondary | ICD-10-CM | POA: Insufficient documentation

## 2022-05-31 DIAGNOSIS — Z86711 Personal history of pulmonary embolism: Secondary | ICD-10-CM | POA: Diagnosis not present

## 2022-05-31 DIAGNOSIS — G4733 Obstructive sleep apnea (adult) (pediatric): Secondary | ICD-10-CM | POA: Diagnosis not present

## 2022-05-31 DIAGNOSIS — N184 Chronic kidney disease, stage 4 (severe): Secondary | ICD-10-CM | POA: Diagnosis not present

## 2022-05-31 DIAGNOSIS — Z7984 Long term (current) use of oral hypoglycemic drugs: Secondary | ICD-10-CM | POA: Insufficient documentation

## 2022-05-31 DIAGNOSIS — Z79899 Other long term (current) drug therapy: Secondary | ICD-10-CM | POA: Insufficient documentation

## 2022-05-31 DIAGNOSIS — Z794 Long term (current) use of insulin: Secondary | ICD-10-CM | POA: Diagnosis not present

## 2022-05-31 DIAGNOSIS — Z87891 Personal history of nicotine dependence: Secondary | ICD-10-CM | POA: Insufficient documentation

## 2022-05-31 DIAGNOSIS — Z8673 Personal history of transient ischemic attack (TIA), and cerebral infarction without residual deficits: Secondary | ICD-10-CM | POA: Diagnosis not present

## 2022-05-31 DIAGNOSIS — E1122 Type 2 diabetes mellitus with diabetic chronic kidney disease: Secondary | ICD-10-CM | POA: Insufficient documentation

## 2022-05-31 LAB — BRAIN NATRIURETIC PEPTIDE: B Natriuretic Peptide: 1157.3 pg/mL — ABNORMAL HIGH (ref 0.0–100.0)

## 2022-05-31 LAB — BASIC METABOLIC PANEL
Anion gap: 10 (ref 5–15)
BUN: 37 mg/dL — ABNORMAL HIGH (ref 6–20)
CO2: 23 mmol/L (ref 22–32)
Calcium: 8.3 mg/dL — ABNORMAL LOW (ref 8.9–10.3)
Chloride: 108 mmol/L (ref 98–111)
Creatinine, Ser: 2.64 mg/dL — ABNORMAL HIGH (ref 0.44–1.00)
GFR, Estimated: 22 mL/min — ABNORMAL LOW (ref >60)
Glucose, Bld: 106 mg/dL — ABNORMAL HIGH (ref 70–99)
Potassium: 3.9 mmol/L (ref 3.5–5.1)
Sodium: 141 mmol/L (ref 135–145)

## 2022-05-31 MED ORDER — DAPAGLIFLOZIN PROPANEDIOL 10 MG PO TABS: 10.0000 mg | ORAL_TABLET | Freq: Every day | ORAL | 2 refills | Status: DC

## 2022-05-31 MED ORDER — HYDRALAZINE HCL 50 MG PO TABS: 50.0000 mg | ORAL_TABLET | Freq: Three times a day (TID) | ORAL | 2 refills | Status: DC

## 2022-05-31 MED ORDER — ISOSORBIDE MONONITRATE ER 60 MG PO TB24: 60.0000 mg | ORAL_TABLET | Freq: Every day | ORAL | 2 refills | Status: DC

## 2022-05-31 NOTE — Progress Notes (Signed)
   [facility] Vascular Center Transitions of [facility] Heart Failure Pharmacist Encounter  Patient has been educated on current HF medications and potential additions to HF medication regimen Patient verbalizes understanding that over the next few months, these medication doses may change and more medications may be added to optimize HF regimen Patient has been educated on basic disease state pathophysiology and goals of therapy  Kerby Nora, PharmD, BCPS Heart Failure Transitions of Care Clinic Pharmacist [phone]

## 2022-05-31 NOTE — Progress Notes (Signed)
HEART & VASCULAR TRANSITION OF CARE CONSULT NOTE     Referring Physician: Dr. Cathlean Sauer  Primary Care: Selena Batten, NP  Primary Cardiologist: Mertie Moores, MD  HPI: Referred to clinic by Dr. Cathlean Sauer for heart failure consultation.   47 y/o AAF w/ H/o CAD w/ remote MI in 2008 treated w/ PDA angioplasty (no stent). Cath report not on file. H/o CVA, PE/DVT, HTN, IDA, untreated OSA, Type 2DM and CKD IV.  Recent admit 1/24 for dyspnea and LEE. Found to be in acute HF and w/ nonoliguric AKI on CKD. Hypertensive on admit 190s/110s . SCr elevated at 3.12 (b/l ~2.4). Echo showed mildly reduced LVEF 45-50%. RV ok. She was diuresed w/ IV Lasix and started on antihypertensives. Nephrology was consulted. Felt to have b/l diabetic nephropathy c/b cardiorenal syndrome. She diuresed well w/ IV Lasix but SCr plateau ~3, felt to be new baseline. Nephrology transitioned to PO Lasix, 60 mg daily, and noted to trial SGLT2i as outpatient if renal fx remained stable. Other GDMT limited by CKD. Placed on Coreg, Hydralazine, Imdur and amlodipine. D/ced on 1/30 and referred to [facility] clinic. Plan to f/u w/ nephrology as outpatient.    Presents today for f/u. BP remains elevated 180/102. Reports full med compliance. Denies resting dyspnea but notes exertional dyspnea w/ ALDs, NYHA Class II-early III. + leg edema on exam. No CP. Also reports daytime fatigue. Of note, prior diagnosis of OSA but not treated w/ CPAP.      Cardiac Testing  Echo 11/23 1. Left ventricular ejection fraction, by estimation, is 45 to 50%. The  left ventricle has mildly decreased function. The left ventricle  demonstrates global hypokinesis. There is mild concentric left ventricular  hypertrophy. Indeterminate diastolic  filling due to E-A fusion.   2. Right ventricular systolic function is normal. The right ventricular  size is normal. There is mildly elevated pulmonary artery systolic  pressure. The estimated right ventricular systolic  pressure is [id number] mmHg.   3. Left atrial size was moderately dilated.   4. A small pericardial effusion is present. The pericardial effusion is  circumferential.   5. The mitral valve is normal in structure. Mild to moderate mitral valve  regurgitation.   6. Tricuspid valve regurgitation is mild to moderate.   7. The aortic valve is tricuspid. Aortic valve regurgitation is not  visualized. No aortic stenosis is present.    Review of Systems: [y] = yes, [ ]$  = no   General: Weight gain [ ]$ ; Weight loss [ ]$ ; Anorexia [ ]$ ; Fatigue [ Y]; Fever [ ]$ ; Chills [ ]$ ; Weakness [ ]$   Cardiac: Chest pain/pressure [ ]$ ; Resting SOB [ ]$ ; Exertional SOB [Y ]; Orthopnea [ ]$ ; Pedal Edema [ Y]; Palpitations [ ]$ ; Syncope [ ]$ ; Presyncope [ ]$ ; Paroxysmal nocturnal dyspnea[ ]$   Pulmonary: Cough [ ]$ ; Wheezing[ ]$ ; Hemoptysis[ ]$ ; Sputum [ ]$ ; Snoring [ ]$   GI: Vomiting[ ]$ ; Dysphagia[ ]$ ; Melena[ ]$ ; Hematochezia [ ]$ ; Heartburn[ ]$ ; Abdominal pain [ ]$ ; Constipation [ ]$ ; Diarrhea [ ]$ ; BRBPR [ ]$   GU: Hematuria[ ]$ ; Dysuria [ ]$ ; Nocturia[ ]$   Vascular: Pain in legs with walking [ ]$ ; Pain in feet with lying flat [ ]$ ; Non-healing sores [ ]$ ; Stroke [ ]$ ; TIA [ ]$ ; Slurred speech [ ]$ ;  Neuro: Headaches[ ]$ ; Vertigo[ ]$ ; Seizures[ ]$ ; Paresthesias[ ]$ ;Blurred vision [ ]$ ; Diplopia [ ]$ ; Vision changes [ ]$   Ortho/Skin: Arthritis [ ]$ ; Joint pain [ ]$ ; Muscle pain [ ]$ ; Joint swelling [ ]$ ;  Back Pain '[ ]'$ ; Rash '[ ]'$   Psych: Depression'[ ]'$ ; Anxiety'[ ]'$   Heme: Bleeding problems '[ ]'$ ; Clotting disorders '[ ]'$ ; Anemia '[ ]'$   Endocrine: Diabetes [Y ]; Thyroid dysfunction'[ ]'$    Past Medical History:  Diagnosis Date   Abscess of tunica vaginalis    10/09- Abundant S. aureus- sensitive to all abx   Anxiety    Blood dyscrasia    CAD (coronary artery disease) 06/15/2006   s/p Subendocardial MI with PDA angioplasty(no stent) on 06/15/06 and relook  cath 06/19/06 showed patency of site. Cath 12/10- no restenosis or significant CAD progression   CVA (cerebral vascular  accident) ([place]) ~ 02/2014   denies residual on 04/22/2014   CVA (cerebral vascular accident) [facility])    history of remote right cerebellar infarct noted on head CT at least since 10/2011   Depression    Diabetes mellitus type 2, uncontrolled, with complications    Fibromyalgia    Gastritis    Gastroparesis    secondary to poorly controlled DM, last emptying study performed 01/2010  was normal but may be falsely positive as pt was on reglan   GERD (gastroesophageal reflux disease)    Hepatitis B, chronic (HCC)    Hep BeAb+,Hep B cAb+ & Hep BsAg+ (9/06)   History of pyelonephritis    H/o GrpB Pyelonephritis (9/06) and UTI- 07/11- E.Coli, 12/10- GBS   Hyperlipidemia    Hypertension    Iron deficiency anemia    Irregular menses    Small ovarian follicles seen on QZ(0/09)   MI (myocardial infarction) ([place]) 05/2006   PDA percutaneous transluminal coronary angioplasty   Migraine    "weekly" (04/22/2014)   N&V (nausea and vomiting)    Chronic. Unclear etiology with multiple admission and ED visits. CT abdomen with and without contrast (02/2011)  showed no acute process. Gastic Emptying scan (01/2010) was normal. Ultrasound of the abdomen was within normal limits. Hepatitis B viral load was undectable. HIV NR. EGD - gastritis, Hpylori + s/p Rx   New onset of congestive heart failure ([place]) 05/16/2022   Obesity    OSA (obstructive sleep apnea)    "suppose to wear mask but I don't" (04/22/2014)   Peripheral neuropathy    Pneumonia    "this is probably the 2nd or 3rd time I've had pneumonia" (04/22/2014)   Recurrent boils    Seasonal asthma    Substance abuse (HCC)    Thrombocytosis    Hem/Onc suggested 2/2 chronic hepatits and/or iron deficiency anemia    Current Outpatient Medications  Medication Sig Dispense Refill   albuterol (VENTOLIN HFA) 108 (90 Base) MCG/ACT inhaler Inhale 2 puffs into the lungs every 4 (four) hours as needed for wheezing or shortness of breath. 8 g 1   amLODipine  (NORVASC) 10 MG tablet Take 1 tablet (10 mg total) by mouth daily. 30 tablet 0   apixaban (ELIQUIS) 5 MG TABS tablet Take 1 tablet (5 mg total) by mouth 2 (two) times daily. 60 tablet 0   atorvastatin (LIPITOR) 40 MG tablet Take 1 tablet (40 mg total) by mouth daily. 30 tablet 1   Blood Glucose Monitoring Suppl ([place]) w/Device KIT Use in the morning, at noon, and at bedtime. 1 kit 0   carvedilol (COREG) 25 MG tablet Take 1 tablet (25 mg total) by mouth 2 (two) times daily with a meal. 60 tablet 1   cyclobenzaprine (FLEXERIL) 10 MG tablet Take 10 mg by mouth 3 (three) times daily as needed  for muscle spasms.     dapagliflozin propanediol (FARXIGA) 10 MG TABS tablet Take 1 tablet (10 mg total) by mouth daily before breakfast. 30 tablet 2   dicyclomine (BENTYL) 10 MG capsule Take 1 capsule (10 mg total) by mouth 4 (four) times daily -  before meals and at bedtime. 20 capsule 0   diphenhydrAMINE (BENADRYL) 25 MG tablet Take 25 mg by mouth every 6 (six) hours as needed for allergies.     famotidine (PEPCID) 20 MG tablet Take 20 mg by mouth every evening.     furosemide (LASIX) 20 MG tablet Take 3 tablets (60 mg total) by mouth daily. 90 tablet 0   glucose blood (ONETOUCH VERIO) test strip Use as instructed to check blood sugar 3 times daily. Dx codes: E11.8, E11.65 100 each 11   glucose blood test strip Use 1 each in the morning, at noon, and at bedtime. 100 strip 0   Insulin Pen Needle (PEN NEEDLES 3/16") 31G X 5 MM MISC 1 Device by Does not apply route 4 (four) times daily - after meals and at bedtime. 100 each 5   INSULIN SYRINGE .5CC/28G (INS SYRINGE/NEEDLE .5CC/28G) 28G X 1/2" 0.5 ML MISC Please provide 1 month supply 100 each 0   Lancet Device MISC Use in the morning, at noon, and at bedtime. 1 each 0   Lancets (ONETOUCH DELICA PLUS UMPNTI14E) MISC Use in the morning, at noon, and at bedtime. 100 each 0   linaclotide (LINZESS) 145 MCG CAPS capsule Take 145 mcg by mouth daily  as needed (for constipation).     meclizine (ANTIVERT) 25 MG tablet Take 1 tablet (25 mg total) by mouth 3 (three) times daily as needed for dizziness. 30 tablet 0   MOUNJARO 7.5 MG/0.5ML Pen Inject 7.5 mg into the skin every Monday.     omeprazole (PRILOSEC) 40 MG capsule Take 40 mg by mouth at bedtime.     ondansetron (ZOFRAN-ODT) 4 MG disintegrating tablet Take 4 mg by mouth daily as needed for nausea or vomiting (DISSOLVE ORALLY).     OneTouch Delica Lancets 31V MISC Use to check blood sugar 3 times daily. Dx codes: E11.8, E11.65 100 each 11   oxyCODONE-acetaminophen (PERCOCET) 10-325 MG tablet Take 1 tablet by mouth 3 (three) times daily as needed for pain.     promethazine (PHENERGAN) 25 MG tablet Take 25 mg by mouth every 6 (six) hours as needed for nausea or vomiting.     senna (SENOKOT) 8.6 MG TABS tablet Take 1 tablet (8.6 mg total) by mouth 2 (two) times daily. (Patient taking differently: Take 1 tablet by mouth 2 (two) times daily as needed for mild constipation.) 30 tablet 0   acetaminophen (TYLENOL) 500 MG tablet Take 1,000 mg by mouth every 6 (six) hours as needed for mild pain, moderate pain or headache. (Patient not taking: Reported on 05/31/2022)     Blood Pressure KIT 1 Application by Does not apply route daily at 12 noon. (Patient not taking: Reported on 05/31/2022) 1 kit 0   gabapentin (NEURONTIN) 400 MG capsule Take 1 capsule (400 mg total) by mouth daily at 6 (six) AM. (Patient not taking: Reported on 05/31/2022)     hydrALAZINE (APRESOLINE) 50 MG tablet Take 1 tablet (50 mg total) by mouth every 8 (eight) hours. 90 tablet 2   isosorbide mononitrate (IMDUR) 60 MG 24 hr tablet Take 1 tablet (60 mg total) by mouth daily. 30 tablet 2   No current facility-administered medications for this  encounter.    Allergies  Allergen Reactions   Lisinopril Nausea Only and Swelling   Morphine And Related Itching and Swelling      Social History   Socioeconomic History   Marital status:  Single    Spouse name: Not on file   Number of children: 2   Years of education: 49   Highest education level: Not on file  Occupational History   Occupation: other    Comment: unemployed    Comment: disability  Tobacco Use   Smoking status: Former    Types: Cigarettes    Quit date: 04/24/1996    Years since quitting: 26.1   Smokeless tobacco: Never   Tobacco comments:    quit smoking cigarettes age 22  Vaping Use   Vaping Use: Never used  Substance and Sexual Activity   Alcohol use: No    Alcohol/week: 0.0 standard drinks of alcohol    Comment: 04/22/2014 "might have a few drinks a month"   Drug use: Not Currently    Types: Marijuana, Cocaine    Comment: 04/22/2104 "quit drugs ~ 1-2 yr ago"   Sexual activity: Yes    Birth control/protection: None  Other Topics Concern   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   Also works at [employer] having to lift elderly individuals.         Social Determinants of Health   Financial Resource Strain: Low Risk  (05/18/2022)   Overall Financial Resource Strain (CARDIA)    Difficulty of Paying Living Expenses: Not very hard  Food Insecurity: No Food Insecurity (05/18/2022)   Hunger Vital Sign    Worried About Running Out of Food in the Last Year: Never true    Ran Out of Food in the Last Year: Never true  Transportation Needs: No Transportation Needs (05/18/2022)   PRAPARE - Hydrologist (Medical): No    Lack of Transportation (Non-Medical): No  Physical Activity: Not on file  Stress: Not on file  Social Connections: Not on file  Intimate Partner Violence: Not At Risk (03/22/2022)   Humiliation, Afraid, Rape, and Kick questionnaire    Fear of Current or Ex-Partner: No    Emotionally Abused: No    Physically Abused: No    Sexually Abused: No      Family History  Problem Relation Age of Onset   Diabetes Father     Healthy Mother     Vitals:   05/31/22 1435  BP: (!) 180/102  Pulse: 95  SpO2: 94%  Weight: 94.8 kg (209 lb)  Height: '5\' 7"'$  (1.702 m)    PHYSICAL EXAM: General:  Well appearing. No respiratory difficulty HEENT: normal Neck: supple. JVD 8 cm. Carotids 2+ bilat; no bruits. No lymphadenopathy or thryomegaly appreciated. Cor: PMI nondisplaced. Regular rate & rhythm. No rubs, gallops or murmurs. Lungs: clear Abdomen: soft, nontender, nondistended. No hepatosplenomegaly. No bruits or masses. Good bowel sounds. Extremities: no cyanosis, clubbing, rash, 1+ b/l edema Neuro: alert & oriented x 3, cranial nerves grossly intact. moves all 4 extremities w/o difficulty. Affect pleasant.  ECG: Not performed    ASSESSMENT & PLAN:  1. Chronic Systolic Heart Failure - Echo 11/23 EF 45-50%, RV ok  - NYHA Class II-early III. Mildly volume overloaded on exam  - GDMT limited by CKD IV - continue coreg 25 mg bid  - increase hydralazine to  50 mg tid - increase imdur to 60 mg daily  - continue Lasix 60 mg daily  - per nephrology note 1/30, could trial SGLT2i if renal fx stable post hospital w/ close monitoring of labs (recommended repeat BMP 1 wk after initiation). Will check BMP today. If SCr stable from hospital level, will plan to start Farxiga 10 mg daily and repeat BMP in 7-10 days.   2. Hypertension  - uncontrolled, up titrate GDMT per above - suspect OSA contributing. Will need repeat OSA evaluation/sleep study for CPAP  3. CKD IV - followed by [facility] - suspect diabetic + hypertensive nephropathy  - new baseline SCr ~3.0  - starting SLGT2i, see discussion above  - BMP today and again in 7 days  4. Fatigue - suspect OSA. See recs above - given CKD, will also check iron studies to r/o IDA     Referred to HFSW (PCP, Medications, Transportation, ETOH Abuse, Drug Abuse, Insurance, Financial ): No Refer to Pharmacy: Yes  Refer to Home Health: No Refer to Advanced Heart Failure Clinic:  No  Refer to General Cardiology: Yes (Dr. Cathie Olden)   Follow up  in [facility] clinic next week. Needs repeat BMP

## 2022-05-31 NOTE — Patient Instructions (Addendum)
Labs done today. We will contact you only if your labs are abnormal.  INCREASE Imdur '60mg'$  (1 tablet) by mouth daily.   INCREASE Hydralazine '50mg'$  (1 tablet) by mouth daily.   START Farxiga '10mg'$  (1 tablet) by mouth daily.   No other medication changes were made. Please continue all current medications as prescribed.  Your physician recommends that you schedule a follow-up appointment in: 1 week

## 2022-06-09 ENCOUNTER — Other Ambulatory Visit (HOSPITAL_COMMUNITY): Payer: Self-pay | Admitting: *Deleted

## 2022-06-09 MED ORDER — DAPAGLIFLOZIN PROPANEDIOL 10 MG PO TABS: 10.0000 mg | ORAL_TABLET | Freq: Every day | ORAL | 2 refills | Status: DC

## 2022-06-12 ENCOUNTER — Telehealth (HOSPITAL_COMMUNITY): Payer: Self-pay

## 2022-06-12 NOTE — Telephone Encounter (Signed)
error 

## 2022-06-13 ENCOUNTER — Telehealth (HOSPITAL_COMMUNITY): Payer: Self-pay

## 2022-06-13 ENCOUNTER — Other Ambulatory Visit (HOSPITAL_COMMUNITY): Payer: Self-pay

## 2022-06-13 NOTE — Telephone Encounter (Signed)
Called to confirm Heart & Vascular Transitions of Care appointment at 06/14/22. Patient reminded to bring all medications and pill box organizer with them. Confirmed patient has transportation. Gave directions, instructed to utilize [place] parking.  Confirmed appointment prior to ending call.

## 2022-06-14 ENCOUNTER — Ambulatory Visit (HOSPITAL_COMMUNITY): Payer: Medicare HMO

## 2022-06-14 NOTE — Progress Notes (Incomplete)
HEART & VASCULAR TRANSITION OF CARE CONSULT NOTE     Referring Physician: Dr. Cathlean Sauer  Primary Care: Selena Batten, NP  Primary Cardiologist: Mertie Moores, MD  HPI: Referred to clinic by Dr. Cathlean Sauer for heart failure consultation.   47 y/o AAF w/ H/o CAD w/ remote MI in 2008 treated w/ PDA angioplasty (no stent). Cath report not on file. H/o CVA, PE/DVT, HTN, IDA, untreated OSA, Type 2DM and CKD IV.  Recent admit 1/24 for dyspnea and LEE. Found to be in acute HF and w/ nonoliguric AKI on CKD. Hypertensive on admit 190s/110s . SCr elevated at 3.12 (b/l ~2.4). Echo showed mildly reduced LVEF 45-50%. RV ok. She was diuresed w/ IV Lasix and started on antihypertensives. Nephrology was consulted. Felt to have b/l diabetic nephropathy c/b cardiorenal syndrome. She diuresed well w/ IV Lasix but SCr plateau ~3, felt to be new baseline. Nephrology transitioned to PO Lasix, 60 mg daily, and noted to trial SGLT2i as outpatient if renal fx remained stable. Other GDMT limited by CKD. Placed on Coreg, Hydralazine, Imdur and amlodipine. D/ced on 1/30 and referred to [facility] clinic. Plan to f/u w/ nephrology as outpatient.    Presents today for f/u. BP remains elevated 180/102. Reports full med compliance. Denies resting dyspnea but notes exertional dyspnea w/ ALDs, NYHA Class II-early III. + leg edema on exam. No CP. Also reports daytime fatigue. Of note, prior diagnosis of OSA but not treated w/ CPAP.      Cardiac Testing  Echo 11/23 1. Left ventricular ejection fraction, by estimation, is 45 to 50%. The  left ventricle has mildly decreased function. The left ventricle  demonstrates global hypokinesis. There is mild concentric left ventricular  hypertrophy. Indeterminate diastolic  filling due to E-A fusion.   2. Right ventricular systolic function is normal. The right ventricular  size is normal. There is mildly elevated pulmonary artery systolic  pressure. The estimated right ventricular systolic  pressure is [id number] mmHg.   3. Left atrial size was moderately dilated.   4. A small pericardial effusion is present. The pericardial effusion is  circumferential.   5. The mitral valve is normal in structure. Mild to moderate mitral valve  regurgitation.   6. Tricuspid valve regurgitation is mild to moderate.   7. The aortic valve is tricuspid. Aortic valve regurgitation is not  visualized. No aortic stenosis is present.    Review of Systems: [y] = yes, [ ]$  = no   General: Weight gain [ ]$ ; Weight loss [ ]$ ; Anorexia [ ]$ ; Fatigue [ Y]; Fever [ ]$ ; Chills [ ]$ ; Weakness [ ]$   Cardiac: Chest pain/pressure [ ]$ ; Resting SOB [ ]$ ; Exertional SOB [Y ]; Orthopnea [ ]$ ; Pedal Edema [ Y]; Palpitations [ ]$ ; Syncope [ ]$ ; Presyncope [ ]$ ; Paroxysmal nocturnal dyspnea[ ]$   Pulmonary: Cough [ ]$ ; Wheezing[ ]$ ; Hemoptysis[ ]$ ; Sputum [ ]$ ; Snoring [ ]$   GI: Vomiting[ ]$ ; Dysphagia[ ]$ ; Melena[ ]$ ; Hematochezia [ ]$ ; Heartburn[ ]$ ; Abdominal pain [ ]$ ; Constipation [ ]$ ; Diarrhea [ ]$ ; BRBPR [ ]$   GU: Hematuria[ ]$ ; Dysuria [ ]$ ; Nocturia[ ]$   Vascular: Pain in legs with walking [ ]$ ; Pain in feet with lying flat [ ]$ ; Non-healing sores [ ]$ ; Stroke [ ]$ ; TIA [ ]$ ; Slurred speech [ ]$ ;  Neuro: Headaches[ ]$ ; Vertigo[ ]$ ; Seizures[ ]$ ; Paresthesias[ ]$ ;Blurred vision [ ]$ ; Diplopia [ ]$ ; Vision changes [ ]$   Ortho/Skin: Arthritis [ ]$ ; Joint pain [ ]$ ; Muscle pain [ ]$ ; Joint swelling [ ]$ ;  Back Pain [ ]$ ; Rash [ ]$   Psych: Depression[ ]$ ; Anxiety[ ]$   Heme: Bleeding problems [ ]$ ; Clotting disorders [ ]$ ; Anemia [ ]$   Endocrine: Diabetes [Y ]; Thyroid dysfunction[ ]$    Past Medical History:  Diagnosis Date   Abscess of tunica vaginalis    10/09- Abundant S. aureus- sensitive to all abx   Anxiety    Blood dyscrasia    CAD (coronary artery disease) 06/15/2006   s/p Subendocardial MI with PDA angioplasty(no stent) on 06/15/06 and relook  cath 06/19/06 showed patency of site. Cath 12/10- no restenosis or significant CAD progression   CVA (cerebral vascular  accident) ([place]) ~ 02/2014   denies residual on 04/22/2014   CVA (cerebral vascular accident) [facility])    history of remote right cerebellar infarct noted on head CT at least since 10/2011   Depression    Diabetes mellitus type 2, uncontrolled, with complications    Fibromyalgia    Gastritis    Gastroparesis    secondary to poorly controlled DM, last emptying study performed 01/2010  was normal but may be falsely positive as pt was on reglan   GERD (gastroesophageal reflux disease)    Hepatitis B, chronic (HCC)    Hep BeAb+,Hep B cAb+ & Hep BsAg+ (9/06)   History of pyelonephritis    H/o GrpB Pyelonephritis (9/06) and UTI- 07/11- E.Coli, 12/10- GBS   Hyperlipidemia    Hypertension    Iron deficiency anemia    Irregular menses    Small ovarian follicles seen on [id number])   MI (myocardial infarction) ([place]) 05/2006   PDA percutaneous transluminal coronary angioplasty   Migraine    "weekly" (04/22/2014)   N&V (nausea and vomiting)    Chronic. Unclear etiology with multiple admission and ED visits. CT abdomen with and without contrast (02/2011)  showed no acute process. Gastic Emptying scan (01/2010) was normal. Ultrasound of the abdomen was within normal limits. Hepatitis B viral load was undectable. HIV NR. EGD - gastritis, Hpylori + s/p Rx   New onset of congestive heart failure ([place]) 05/16/2022   Obesity    OSA (obstructive sleep apnea)    "suppose to wear mask but I don't" (04/22/2014)   Peripheral neuropathy    Pneumonia    "this is probably the 2nd or 3rd time I've had pneumonia" (04/22/2014)   Recurrent boils    Seasonal asthma    Substance abuse (HCC)    Thrombocytosis    Hem/Onc suggested 2/2 chronic hepatits and/or iron deficiency anemia    Current Outpatient Medications  Medication Sig Dispense Refill   acetaminophen (TYLENOL) 500 MG tablet Take 1,000 mg by mouth every 6 (six) hours as needed for mild pain, moderate pain or headache. (Patient not taking: Reported on  05/31/2022)     albuterol (VENTOLIN HFA) 108 (90 Base) MCG/ACT inhaler Inhale 2 puffs into the lungs every 4 (four) hours as needed for wheezing or shortness of breath. 8 g 1   amLODipine (NORVASC) 10 MG tablet Take 1 tablet (10 mg total) by mouth daily. 30 tablet 0   apixaban (ELIQUIS) 5 MG TABS tablet Take 1 tablet (5 mg total) by mouth 2 (two) times daily. 60 tablet 0   atorvastatin (LIPITOR) 40 MG tablet Take 1 tablet (40 mg total) by mouth daily. 30 tablet 1   Blood Glucose Monitoring Suppl ([place]) w/Device KIT Use in the morning, at noon, and at bedtime. 1 kit 0   Blood Pressure KIT 1 Application by Does not  apply route daily at 12 noon. (Patient not taking: Reported on 05/31/2022) 1 kit 0   carvedilol (COREG) 25 MG tablet Take 1 tablet (25 mg total) by mouth 2 (two) times daily with a meal. 60 tablet 1   cyclobenzaprine (FLEXERIL) 10 MG tablet Take 10 mg by mouth 3 (three) times daily as needed for muscle spasms.     dapagliflozin propanediol (FARXIGA) 10 MG TABS tablet Take 1 tablet (10 mg total) by mouth daily before breakfast. 30 tablet 2   dicyclomine (BENTYL) 10 MG capsule Take 1 capsule (10 mg total) by mouth 4 (four) times daily -  before meals and at bedtime. 20 capsule 0   diphenhydrAMINE (BENADRYL) 25 MG tablet Take 25 mg by mouth every 6 (six) hours as needed for allergies.     famotidine (PEPCID) 20 MG tablet Take 20 mg by mouth every evening.     furosemide (LASIX) 20 MG tablet Take 3 tablets (60 mg total) by mouth daily. 90 tablet 0   gabapentin (NEURONTIN) 400 MG capsule Take 1 capsule (400 mg total) by mouth daily at 6 (six) AM. (Patient not taking: Reported on 05/31/2022)     glucose blood (ONETOUCH VERIO) test strip Use as instructed to check blood sugar 3 times daily. Dx codes: E11.8, E11.65 100 each 11   glucose blood test strip Use 1 each in the morning, at noon, and at bedtime. 100 strip 0   hydrALAZINE (APRESOLINE) 50 MG tablet Take 1 tablet (50 mg total)  by mouth every 8 (eight) hours. 90 tablet 2   Insulin Pen Needle (PEN NEEDLES 3/16") 31G X 5 MM MISC 1 Device by Does not apply route 4 (four) times daily - after meals and at bedtime. 100 each 5   INSULIN SYRINGE .5CC/28G (INS SYRINGE/NEEDLE .5CC/28G) 28G X 1/2" 0.5 ML MISC Please provide 1 month supply 100 each 0   isosorbide mononitrate (IMDUR) 60 MG 24 hr tablet Take 1 tablet (60 mg total) by mouth daily. 30 tablet 2   Lancet Device MISC Use in the morning, at noon, and at bedtime. 1 each 0   Lancets (ONETOUCH DELICA PLUS Q000111Q) MISC Use in the morning, at noon, and at bedtime. 100 each 0   linaclotide (LINZESS) 145 MCG CAPS capsule Take 145 mcg by mouth daily as needed (for constipation).     meclizine (ANTIVERT) 25 MG tablet Take 1 tablet (25 mg total) by mouth 3 (three) times daily as needed for dizziness. 30 tablet 0   MOUNJARO 7.5 MG/0.5ML Pen Inject 7.5 mg into the skin every Monday.     omeprazole (PRILOSEC) 40 MG capsule Take 40 mg by mouth at bedtime.     ondansetron (ZOFRAN-ODT) 4 MG disintegrating tablet Take 4 mg by mouth daily as needed for nausea or vomiting (DISSOLVE ORALLY).     OneTouch Delica Lancets 99991111 MISC Use to check blood sugar 3 times daily. Dx codes: E11.8, E11.65 100 each 11   oxyCODONE-acetaminophen (PERCOCET) 10-325 MG tablet Take 1 tablet by mouth 3 (three) times daily as needed for pain.     promethazine (PHENERGAN) 25 MG tablet Take 25 mg by mouth every 6 (six) hours as needed for nausea or vomiting.     senna (SENOKOT) 8.6 MG TABS tablet Take 1 tablet (8.6 mg total) by mouth 2 (two) times daily. (Patient taking differently: Take 1 tablet by mouth 2 (two) times daily as needed for mild constipation.) 30 tablet 0   No current facility-administered medications for this  visit.    Allergies  Allergen Reactions   Lisinopril Nausea Only and Swelling   Morphine And Related Itching and Swelling      Social History   Socioeconomic History   Marital status:  Single    Spouse name: Not on file   Number of children: 2   Years of education: 54   Highest education level: Not on file  Occupational History   Occupation: other    Comment: unemployed    Comment: disability  Tobacco Use   Smoking status: Former    Types: Cigarettes    Quit date: 04/24/1996    Years since quitting: 26.1   Smokeless tobacco: Never   Tobacco comments:    quit smoking cigarettes age 61  Vaping Use   Vaping Use: Never used  Substance and Sexual Activity   Alcohol use: No    Alcohol/week: 0.0 standard drinks of alcohol    Comment: 04/22/2014 "might have a few drinks a month"   Drug use: Not Currently    Types: Marijuana, Cocaine    Comment: 04/22/2104 "quit drugs ~ 1-2 yr ago"   Sexual activity: Yes    Birth control/protection: None  Other Topics Concern   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   Also works at [employer] having to lift elderly individuals.         Social Determinants of Health   Financial Resource Strain: Low Risk  (05/18/2022)   Overall Financial Resource Strain (CARDIA)    Difficulty of Paying Living Expenses: Not very hard  Food Insecurity: No Food Insecurity (05/18/2022)   Hunger Vital Sign    Worried About Running Out of Food in the Last Year: Never true    Ran Out of Food in the Last Year: Never true  Transportation Needs: No Transportation Needs (05/18/2022)   PRAPARE - Hydrologist (Medical): No    Lack of Transportation (Non-Medical): No  Physical Activity: Not on file  Stress: Not on file  Social Connections: Not on file  Intimate Partner Violence: Not At Risk (03/22/2022)   Humiliation, Afraid, Rape, and Kick questionnaire    Fear of Current or Ex-Partner: No    Emotionally Abused: No    Physically Abused: No    Sexually Abused: No      Family History  Problem Relation Age of Onset   Diabetes Father     Healthy Mother     There were no vitals filed for this visit.   PHYSICAL EXAM: General:  Well appearing. No respiratory difficulty HEENT: normal Neck: supple. JVD 8 cm. Carotids 2+ bilat; no bruits. No lymphadenopathy or thryomegaly appreciated. Cor: PMI nondisplaced. Regular rate & rhythm. No rubs, gallops or murmurs. Lungs: clear Abdomen: soft, nontender, nondistended. No hepatosplenomegaly. No bruits or masses. Good bowel sounds. Extremities: no cyanosis, clubbing, rash, 1+ b/l edema Neuro: alert & oriented x 3, cranial nerves grossly intact. moves all 4 extremities w/o difficulty. Affect pleasant.  ECG: Not performed    ASSESSMENT & PLAN:  1. Chronic Systolic Heart Failure - Echo 11/23 EF 45-50%, RV ok  - NYHA Class II-early III. Mildly volume overloaded on exam  - GDMT limited by CKD IV - continue coreg 25 mg bid  - increase hydralazine to 50 mg tid - increase imdur to 60 mg daily  - continue Lasix 60 mg daily  - per  nephrology note 1/30, could trial SGLT2i if renal fx stable post hospital w/ close monitoring of labs (recommended repeat BMP 1 wk after initiation). Will check BMP today. If SCr stable from hospital level, will plan to start Farxiga 10 mg daily and repeat BMP in 7-10 days.   2. Hypertension  - uncontrolled, up titrate GDMT per above - suspect OSA contributing. Will need repeat OSA evaluation/sleep study for CPAP  3. CKD IV - followed by [facility] - suspect diabetic + hypertensive nephropathy  - new baseline SCr ~3.0  - starting SLGT2i, see discussion above  - BMP today and again in 7 days  4. Fatigue - suspect OSA. See recs above - given CKD, will also check iron studies to r/o IDA     Referred to HFSW (PCP, Medications, Transportation, ETOH Abuse, Drug Abuse, Insurance, Financial ): No Refer to Pharmacy: Yes  Refer to Home Health: No Refer to Advanced Heart Failure Clinic: No  Refer to General Cardiology: Yes (Dr. Cathie Olden)   Follow up  in [facility]  clinic next week. Needs repeat BMP

## 2022-06-28 IMAGING — CR DG CHEST 2V
2 series · 2 of 2 positions shown · non-contrast
Comparison: Radiograph 02/15/2019

CLINICAL DATA: Chest pain.  Vomiting.

EXAM:
CHEST - 2 VIEW

[chest lat]
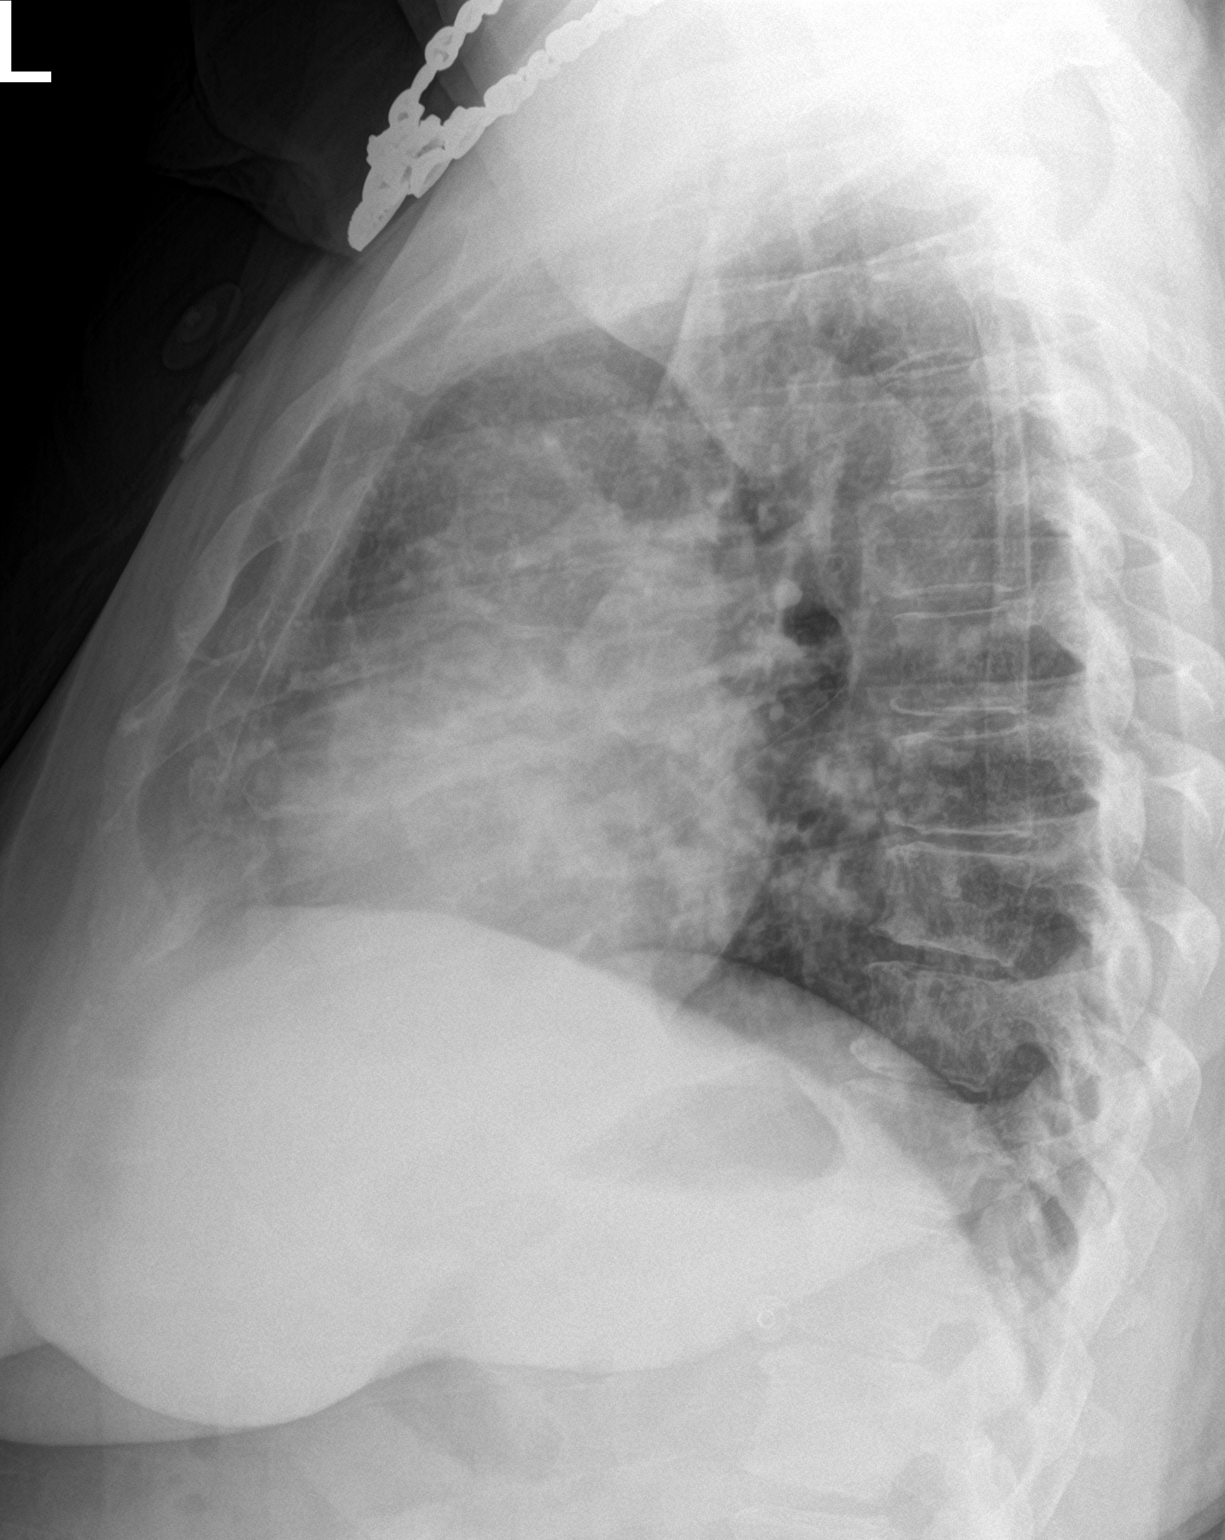

[chest ap]
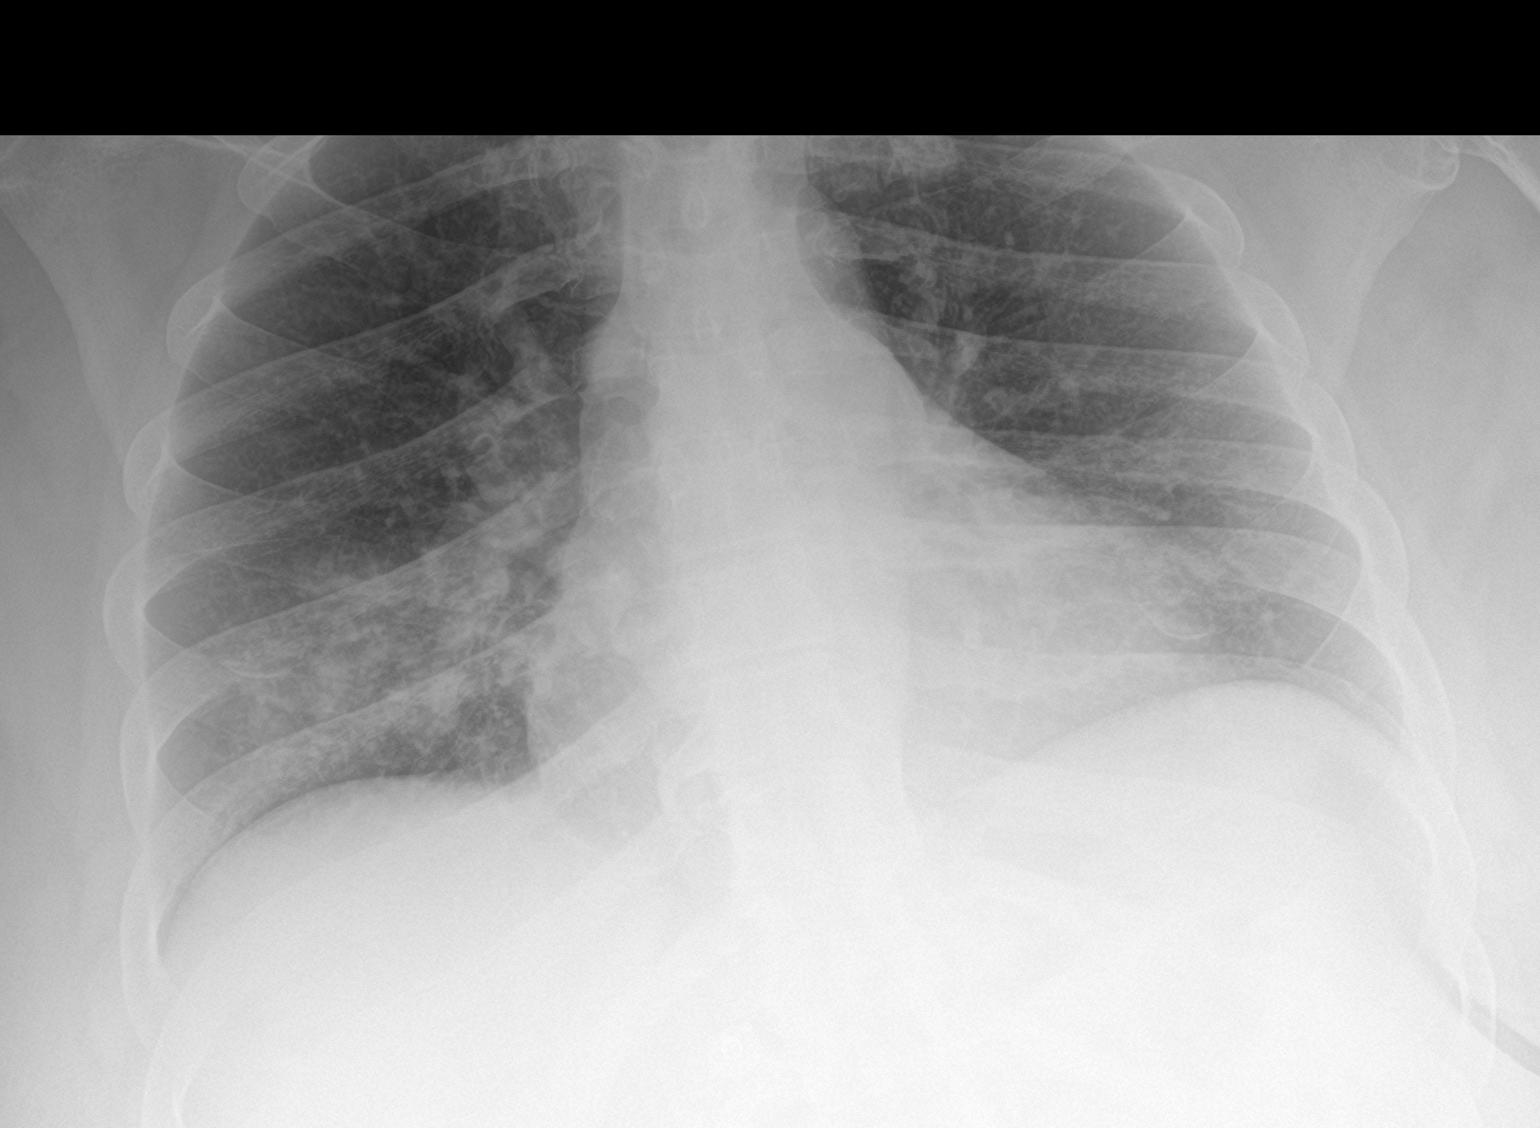

[2 of 2 positions shown; findings below may reference images not displayed]

FINDINGS: Lung volumes are low. Patchy bilateral lower lung zone airspace
opacities. Stable upper normal heart size. Unchanged mediastinal
contours. No pleural effusion or pneumothorax. No acute osseous
abnormalities are seen.
IMPRESSION: Low lung volumes with patchy bilateral lower lung zone airspace
opacities, suspicious for pneumonia, particularly on the right. In
the setting of vomiting, aspiration is considered.

## 2022-06-30 IMAGING — NM NM PULMONARY PERF PARTICULATE
8 series · 8 of 8 positions shown · non-contrast
Comparison: Chest x-ray 11/25/2019

CLINICAL DATA: Chest pain, cough

EXAM:
NUCLEAR MEDICINE PERFUSION LUNG SCAN
TECHNIQUE: Perfusion images were obtained in multiple projections after
intravenous injection of radiopharmaceutical.
Ventilation scans intentionally deferred if perfusion scan and chest
x-ray adequate for interpretation during COVID 19 epidemic.
RADIOPHARMACEUTICALS:  4.1 mCi Gc-BBm MAA IV

[Series 1: ant/post perf · 4.14mm/px · 1 of 1 slices shown (1 of 2)]
[im 1/1]
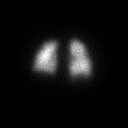

[Series 1: ant/post perf · 4.14mm/px · 1 of 1 slices shown (2 of 2)]
[im 1/1]
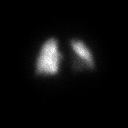

[Series 2: lao/rpo perf · 4.14mm/px · 1 of 1 slices shown (1 of 2)]
[im 1/1]
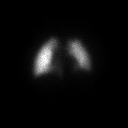

[Series 2: lao/rpo perf · 4.14mm/px · 1 of 1 slices shown (2 of 2)]
[im 1/1]
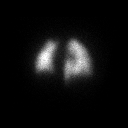

[Series 3: lpo/rao perf · 4.14mm/px · 1 of 1 slices shown (1 of 2)]
[im 1/1]
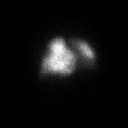

[Series 3: lpo/rao perf · 4.14mm/px · 1 of 1 slices shown (2 of 2)]
[im 1/1]
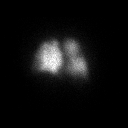

[Series 4: lt lat/rt lat perf · 4.14mm/px · 1 of 1 slices shown (1 of 2)]
[im 1/1]
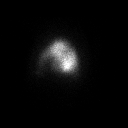

[Series 4: lt lat/rt lat perf · 4.14mm/px · 1 of 1 slices shown (2 of 2)]
[im 1/1]
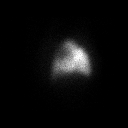

[8 of 8 positions shown; findings below may reference images not displayed]

FINDINGS: Adequate uptake of radiopharmaceutical within the lung fields.
Segmental perfusion defect within the posterior segment of the right
upper lobe. Small nonsegmental defect in the left apicoposterior
segment. No corresponding airspace opacity within the upper lobes on
chest radiograph.
IMPRESSION: Intermediate probability for pulmonary embolism. Consider further
evaluation with lower extremity venous ultrasound and/or CT
angiogram of the chest.

These results will be called to the ordering clinician or
representative by the Radiologist Assistant, and communication
documented in the PACS or [REDACTED].

## 2022-07-07 ENCOUNTER — Other Ambulatory Visit: Payer: Self-pay

## 2022-07-07 ENCOUNTER — Other Ambulatory Visit (HOSPITAL_COMMUNITY): Payer: Self-pay

## 2022-07-26 IMAGING — DX DG FOOT COMPLETE 3+V*R*
3 series · 3 of 3 positions shown · non-contrast
Comparison: None.

CLINICAL DATA: Trauma 3 days ago.  Worsening fourth toe pain.

EXAM:
RIGHT FOOT COMPLETE - 3+ VIEW

[foot ap]
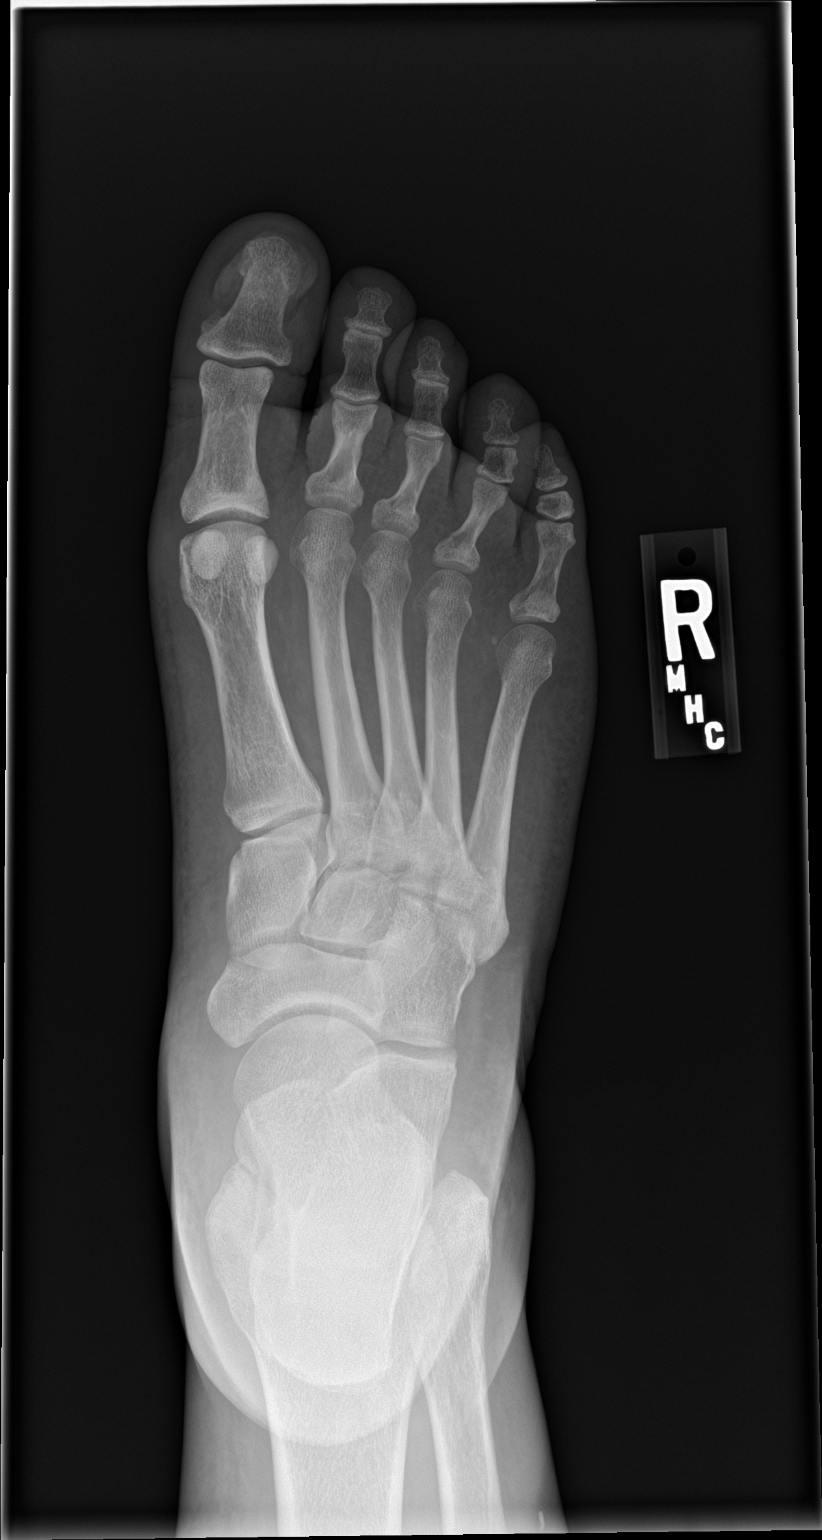

[foot obl]
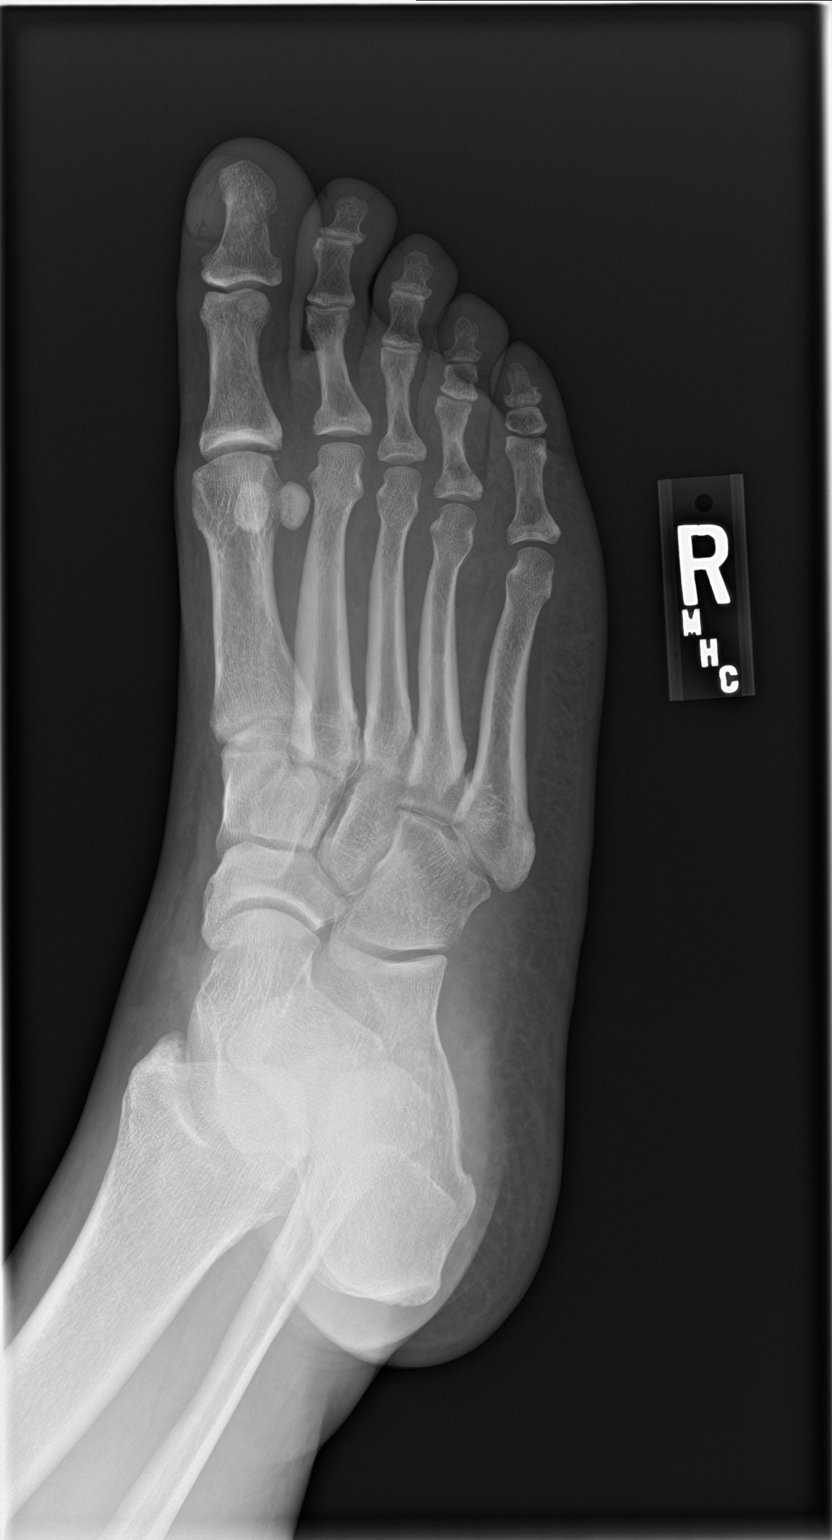

[foot lat]
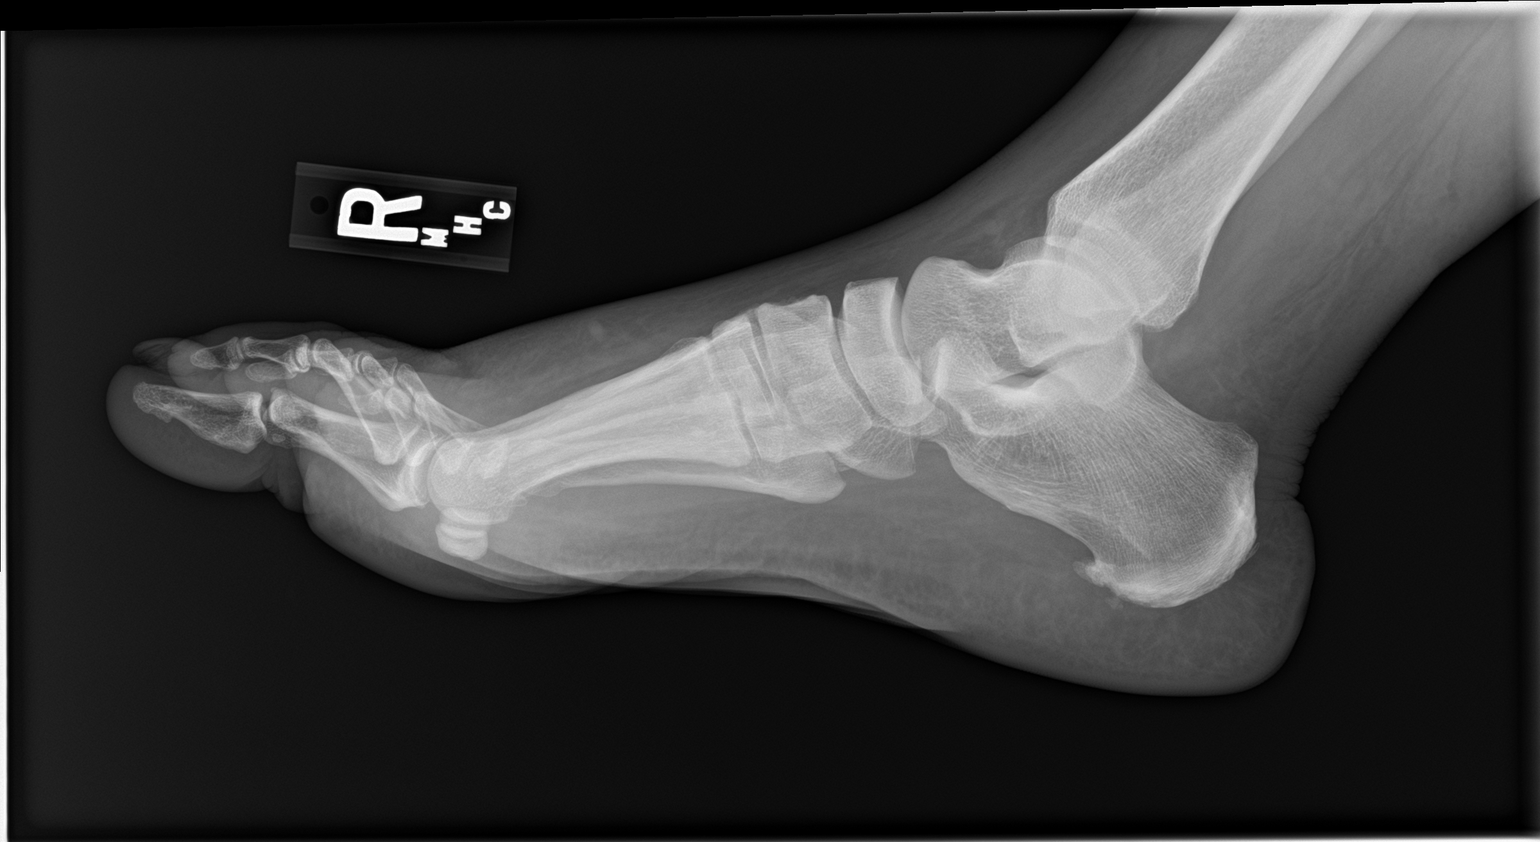

[3 of 3 positions shown; findings below may reference images not displayed]

FINDINGS: Subtle fracture through the base of the proximal fourth phalanx. No
other abnormalities.
IMPRESSION: Subtle fracture through the base of the fourth proximal phalanx.

## 2022-08-07 ENCOUNTER — Encounter (HOSPITAL_BASED_OUTPATIENT_CLINIC_OR_DEPARTMENT_OTHER): Payer: Self-pay | Admitting: Emergency Medicine

## 2022-08-07 ENCOUNTER — Emergency Department (HOSPITAL_BASED_OUTPATIENT_CLINIC_OR_DEPARTMENT_OTHER): Payer: Medicare HMO | Admitting: Radiology

## 2022-08-07 ENCOUNTER — Inpatient Hospital Stay (HOSPITAL_BASED_OUTPATIENT_CLINIC_OR_DEPARTMENT_OTHER)
Admission: EM | Admit: 2022-08-07 | Discharge: 2022-08-15 | DRG: 286 | Disposition: A | Discharge status: Home / Self Care | Payer: Medicare HMO | Attending: Internal Medicine | Admitting: Internal Medicine

## 2022-08-07 DIAGNOSIS — I493 Ventricular premature depolarization: Secondary | ICD-10-CM | POA: Diagnosis present

## 2022-08-07 DIAGNOSIS — Z91199 Patient's noncompliance with other medical treatment and regimen due to unspecified reason: Secondary | ICD-10-CM

## 2022-08-07 DIAGNOSIS — Z6835 Body mass index (BMI) 35.0-35.9, adult: Secondary | ICD-10-CM

## 2022-08-07 DIAGNOSIS — E875 Hyperkalemia: Secondary | ICD-10-CM | POA: Diagnosis not present

## 2022-08-07 DIAGNOSIS — Z86711 Personal history of pulmonary embolism: Secondary | ICD-10-CM | POA: Diagnosis present

## 2022-08-07 DIAGNOSIS — Z79899 Other long term (current) drug therapy: Secondary | ICD-10-CM

## 2022-08-07 DIAGNOSIS — I5022 Chronic systolic (congestive) heart failure: Secondary | ICD-10-CM | POA: Diagnosis present

## 2022-08-07 DIAGNOSIS — Z833 Family history of diabetes mellitus: Secondary | ICD-10-CM

## 2022-08-07 DIAGNOSIS — M797 Fibromyalgia: Secondary | ICD-10-CM | POA: Diagnosis present

## 2022-08-07 DIAGNOSIS — I5023 Acute on chronic systolic (congestive) heart failure: Secondary | ICD-10-CM | POA: Diagnosis present

## 2022-08-07 DIAGNOSIS — I3139 Other pericardial effusion (noninflammatory): Secondary | ICD-10-CM | POA: Diagnosis present

## 2022-08-07 DIAGNOSIS — I161 Hypertensive emergency: Secondary | ICD-10-CM | POA: Diagnosis present

## 2022-08-07 DIAGNOSIS — Z8673 Personal history of transient ischemic attack (TIA), and cerebral infarction without residual deficits: Secondary | ICD-10-CM

## 2022-08-07 DIAGNOSIS — I2489 Other forms of acute ischemic heart disease: Secondary | ICD-10-CM | POA: Diagnosis present

## 2022-08-07 DIAGNOSIS — Z7985 Long-term (current) use of injectable non-insulin antidiabetic drugs: Secondary | ICD-10-CM

## 2022-08-07 DIAGNOSIS — E669 Obesity, unspecified: Secondary | ICD-10-CM | POA: Diagnosis present

## 2022-08-07 DIAGNOSIS — Z79891 Long term (current) use of opiate analgesic: Secondary | ICD-10-CM

## 2022-08-07 DIAGNOSIS — E1169 Type 2 diabetes mellitus with other specified complication: Secondary | ICD-10-CM | POA: Diagnosis present

## 2022-08-07 DIAGNOSIS — Z888 Allergy status to other drugs, medicaments and biological substances status: Secondary | ICD-10-CM

## 2022-08-07 DIAGNOSIS — I081 Rheumatic disorders of both mitral and tricuspid valves: Secondary | ICD-10-CM | POA: Diagnosis present

## 2022-08-07 DIAGNOSIS — Z86718 Personal history of other venous thrombosis and embolism: Secondary | ICD-10-CM

## 2022-08-07 DIAGNOSIS — I252 Old myocardial infarction: Secondary | ICD-10-CM

## 2022-08-07 DIAGNOSIS — D631 Anemia in chronic kidney disease: Secondary | ICD-10-CM | POA: Diagnosis present

## 2022-08-07 DIAGNOSIS — N179 Acute kidney failure, unspecified: Secondary | ICD-10-CM | POA: Diagnosis present

## 2022-08-07 DIAGNOSIS — E1142 Type 2 diabetes mellitus with diabetic polyneuropathy: Secondary | ICD-10-CM | POA: Diagnosis present

## 2022-08-07 DIAGNOSIS — E119 Type 2 diabetes mellitus without complications: Secondary | ICD-10-CM | POA: Diagnosis present

## 2022-08-07 DIAGNOSIS — E876 Hypokalemia: Secondary | ICD-10-CM | POA: Diagnosis present

## 2022-08-07 DIAGNOSIS — Z885 Allergy status to narcotic agent status: Secondary | ICD-10-CM

## 2022-08-07 DIAGNOSIS — D72829 Elevated white blood cell count, unspecified: Secondary | ICD-10-CM | POA: Diagnosis present

## 2022-08-07 DIAGNOSIS — Z560 Unemployment, unspecified: Secondary | ICD-10-CM

## 2022-08-07 DIAGNOSIS — I509 Heart failure, unspecified: Secondary | ICD-10-CM

## 2022-08-07 DIAGNOSIS — I130 Hypertensive heart and chronic kidney disease with heart failure and stage 1 through stage 4 chronic kidney disease, or unspecified chronic kidney disease: Secondary | ICD-10-CM | POA: Diagnosis not present

## 2022-08-07 DIAGNOSIS — D509 Iron deficiency anemia, unspecified: Secondary | ICD-10-CM | POA: Diagnosis present

## 2022-08-07 DIAGNOSIS — I5043 Acute on chronic combined systolic (congestive) and diastolic (congestive) heart failure: Secondary | ICD-10-CM | POA: Diagnosis present

## 2022-08-07 DIAGNOSIS — E1165 Type 2 diabetes mellitus with hyperglycemia: Secondary | ICD-10-CM | POA: Diagnosis present

## 2022-08-07 DIAGNOSIS — B181 Chronic viral hepatitis B without delta-agent: Secondary | ICD-10-CM | POA: Diagnosis present

## 2022-08-07 DIAGNOSIS — N184 Chronic kidney disease, stage 4 (severe): Secondary | ICD-10-CM | POA: Diagnosis present

## 2022-08-07 DIAGNOSIS — I2721 Secondary pulmonary arterial hypertension: Secondary | ICD-10-CM | POA: Diagnosis present

## 2022-08-07 DIAGNOSIS — E872 Acidosis, unspecified: Secondary | ICD-10-CM | POA: Diagnosis present

## 2022-08-07 DIAGNOSIS — G4733 Obstructive sleep apnea (adult) (pediatric): Secondary | ICD-10-CM | POA: Diagnosis present

## 2022-08-07 DIAGNOSIS — I2781 Cor pulmonale (chronic): Secondary | ICD-10-CM | POA: Diagnosis present

## 2022-08-07 DIAGNOSIS — Z87891 Personal history of nicotine dependence: Secondary | ICD-10-CM

## 2022-08-07 DIAGNOSIS — R0600 Dyspnea, unspecified: Secondary | ICD-10-CM

## 2022-08-07 DIAGNOSIS — G8929 Other chronic pain: Secondary | ICD-10-CM | POA: Diagnosis present

## 2022-08-07 DIAGNOSIS — Z7901 Long term (current) use of anticoagulants: Secondary | ICD-10-CM

## 2022-08-07 DIAGNOSIS — Z7982 Long term (current) use of aspirin: Secondary | ICD-10-CM

## 2022-08-07 DIAGNOSIS — J45909 Unspecified asthma, uncomplicated: Secondary | ICD-10-CM | POA: Diagnosis present

## 2022-08-07 DIAGNOSIS — Z955 Presence of coronary angioplasty implant and graft: Secondary | ICD-10-CM

## 2022-08-07 DIAGNOSIS — E1122 Type 2 diabetes mellitus with diabetic chronic kidney disease: Secondary | ICD-10-CM | POA: Diagnosis present

## 2022-08-07 DIAGNOSIS — I2510 Atherosclerotic heart disease of native coronary artery without angina pectoris: Secondary | ICD-10-CM

## 2022-08-07 DIAGNOSIS — I10 Essential (primary) hypertension: Secondary | ICD-10-CM | POA: Diagnosis present

## 2022-08-07 DIAGNOSIS — I420 Dilated cardiomyopathy: Secondary | ICD-10-CM | POA: Diagnosis present

## 2022-08-07 DIAGNOSIS — F419 Anxiety disorder, unspecified: Secondary | ICD-10-CM | POA: Diagnosis present

## 2022-08-07 DIAGNOSIS — E785 Hyperlipidemia, unspecified: Secondary | ICD-10-CM | POA: Diagnosis present

## 2022-08-07 DIAGNOSIS — R079 Chest pain, unspecified: Secondary | ICD-10-CM | POA: Diagnosis not present

## 2022-08-07 DIAGNOSIS — F1411 Cocaine abuse, in remission: Secondary | ICD-10-CM | POA: Diagnosis present

## 2022-08-07 DIAGNOSIS — F32A Depression, unspecified: Secondary | ICD-10-CM | POA: Diagnosis present

## 2022-08-07 DIAGNOSIS — I43 Cardiomyopathy in diseases classified elsewhere: Secondary | ICD-10-CM | POA: Diagnosis present

## 2022-08-07 DIAGNOSIS — K219 Gastro-esophageal reflux disease without esophagitis: Secondary | ICD-10-CM | POA: Diagnosis present

## 2022-08-07 LAB — BASIC METABOLIC PANEL
Anion gap: 10 (ref 5–15)
BUN: 45 mg/dL — ABNORMAL HIGH (ref 6–20)
CO2: 19 mmol/L — ABNORMAL LOW (ref 22–32)
Calcium: 8.7 mg/dL — ABNORMAL LOW (ref 8.9–10.3)
Chloride: 111 mmol/L (ref 98–111)
Creatinine, Ser: 2.78 mg/dL — ABNORMAL HIGH (ref 0.44–1.00)
GFR, Estimated: 20 mL/min — ABNORMAL LOW (ref >60)
Glucose, Bld: 92 mg/dL (ref 70–99)
Potassium: 5.7 mmol/L — ABNORMAL HIGH (ref 3.5–5.1)
Sodium: 140 mmol/L (ref 135–145)

## 2022-08-07 LAB — CBC
HCT: 27.7 % — ABNORMAL LOW (ref 36.0–46.0)
Hemoglobin: 9.4 g/dL — ABNORMAL LOW (ref 12.0–15.0)
MCH: 25.1 pg — ABNORMAL LOW (ref 26.0–34.0)
MCHC: 33.9 g/dL (ref 30.0–36.0)
MCV: 73.9 fL — ABNORMAL LOW (ref 80.0–100.0)
Platelets: 412 10*3/uL — ABNORMAL HIGH (ref 150–400)
RBC: 3.75 MIL/uL — ABNORMAL LOW (ref 3.87–5.11)
RDW: 17.7 % — ABNORMAL HIGH (ref 11.5–15.5)
WBC: 7.7 10*3/uL (ref 4.0–10.5)
nRBC: 0 % (ref 0.0–0.2)

## 2022-08-07 LAB — PREGNANCY, URINE: Preg Test, Ur: NEGATIVE

## 2022-08-07 LAB — TROPONIN I (HIGH SENSITIVITY): Troponin I (High Sensitivity): 31 ng/L — ABNORMAL HIGH (ref <18)

## 2022-08-07 LAB — BRAIN NATRIURETIC PEPTIDE: B Natriuretic Peptide: 2047.2 pg/mL — ABNORMAL HIGH (ref 0.0–100.0)

## 2022-08-07 MED ORDER — FUROSEMIDE 10 MG/ML IJ SOLN
80.0000 mg | Freq: Once | INTRAMUSCULAR | Status: AC
  Administered 2022-08-07: 80 mg via INTRAVENOUS
  Filled 2022-08-07: qty 8

## 2022-08-07 MED ORDER — NITROGLYCERIN IN D5W 200-5 MCG/ML-% IV SOLN
0.0000 ug/min | INTRAVENOUS | Status: DC
  Administered 2022-08-07: 5 ug/min via INTRAVENOUS
  Administered 2022-08-08: 130 ug/min via INTRAVENOUS
  Administered 2022-08-08: 125 ug/min via INTRAVENOUS
  Administered 2022-08-08: 105 ug/min via INTRAVENOUS
  Administered 2022-08-09: 100 ug/min via INTRAVENOUS
  Administered 2022-08-09: 105 ug/min via INTRAVENOUS
  Administered 2022-08-09: 85 ug/min via INTRAVENOUS
  Administered 2022-08-11: 40 ug/min via INTRAVENOUS
  Filled 2022-08-07 (×10): qty 250

## 2022-08-07 MED ORDER — SODIUM ZIRCONIUM CYCLOSILICATE 10 G PO PACK
10.0000 g | PACK | Freq: Once | ORAL | Status: AC
  Administered 2022-08-07: 10 g via ORAL
  Filled 2022-08-07: qty 1

## 2022-08-07 NOTE — Plan of Care (Signed)
Spoke to Dr. Wallace Cullens.  47 year old female with a past medical history of CHF (EF 45 to 50% per echo 02/2022), CKD stage IV, CAD status post PCI, CVA, type 2 diabetes, OSA, history of DVT/PE on Eliquis presented to ED with complaints of dyspnea, orthopnea, bilateral lower extremity edema, and weight gain.  Blood pressure elevated with systolic 190-200. On arrival, she was tachypneic and had difficulty speaking in full sentences.  Not hypoxic, placed on supplemental oxygen for comfort.  Blood pressure 190-200 systolic  Labs showing no leukocytosis, hemoglobin 9.4, potassium 5.7, bicarb 19, BUN 45, creatinine 2.7, BNP 2047.  Troponin 31 but no chest pain and EKG without acute ischemic changes.  Chest x-ray showing cardiomegaly with vascular congestion and small pleural effusions. Patient received IV Lasix 80 mg, Lokelma, and started on nitroglycerin drip.  [facility] will assume care on arrival to accepting facility. Until arrival, care as per EDP. However, [facility] available 24/7 for questions and assistance.  Nursing staff, please page [facility] Admits and Consults [phone]) as soon as the patient arrives to the hospital.

## 2022-08-07 NOTE — ED Notes (Signed)
Patient up to bedside commode to have BM.

## 2022-08-07 NOTE — ED Triage Notes (Signed)
Sob and legs and abdo swelling worsening over last 2 days. Reports history of blood clots, CHF Labored breathing and tachypnea in triage  Difficulty speaking in full sentences

## 2022-08-07 NOTE — ED Provider Notes (Signed)
[place] EMERGENCY DEPARTMENT AT [facility] Provider Note  CSN: [id number] Arrival date & time: 08/07/22 2105  Chief Complaint(s) Shortness of Breath  HPI Shirley [name] is a 47 y.o. female with past medical history as below, significant for combined heart failure, CAD, CVA, DM2, GERD, HBc, obesity, OSA noncompliant with CPAP, DVT on DOAC who presents to the ED with complaint of dyspnea.  Patient reports worsening orthopnea, exertional dyspnea, weight gain over the past 2 days per reports has gained approximate 20 pounds over the past 2 days.  She was previously on Lasix but this was discontinued.  Home oxygen use.  Does not use her nightly CPAP for sleep apnea.  No fevers, chills.  Does report nonproductive cough.  No significant chest pain.  Multi walking secondary to leg swelling.   DR [facility] cardiology   Past Medical History Past Medical History:  Diagnosis Date   Abscess of tunica vaginalis    10/09- Abundant S. aureus- sensitive to all abx   Anxiety    Blood dyscrasia    CAD (coronary artery disease) 06/15/2006   s/p Subendocardial MI with PDA angioplasty(no stent) on 06/15/06 and relook  cath 06/19/06 showed patency of site. Cath 12/10- no restenosis or significant CAD progression   CVA (cerebral vascular accident) ~ 02/2014   denies residual on 04/22/2014   CVA (cerebral vascular accident)    history of remote right cerebellar infarct noted on head CT at least since 10/2011   Depression    Diabetes mellitus type 2, uncontrolled, with complications    Fibromyalgia    Gastritis    Gastroparesis    secondary to poorly controlled DM, last emptying study performed 01/2010  was normal but may be falsely positive as pt was on reglan   GERD (gastroesophageal reflux disease)    Hepatitis B, chronic    Hep BeAb+,Hep B cAb+ & Hep BsAg+ (9/06)   History of pyelonephritis    H/o GrpB Pyelonephritis (9/06) and UTI- 07/11- E.Coli, 12/10- GBS   Hyperlipidemia     Hypertension    Iron deficiency anemia    Irregular menses    Small ovarian follicles seen on CT(9/06)   MI (myocardial infarction) 05/2006   PDA percutaneous transluminal coronary angioplasty   Migraine    "weekly" (04/22/2014)   N&V (nausea and vomiting)    Chronic. Unclear etiology with multiple admission and ED visits. CT abdomen with and without contrast (02/2011)  showed no acute process. Gastic Emptying scan (01/2010) was normal. Ultrasound of the abdomen was within normal limits. Hepatitis B viral load was undectable. HIV NR. EGD - gastritis, Hpylori + s/p Rx   New onset of congestive heart failure 05/16/2022   Obesity    OSA (obstructive sleep apnea)    "suppose to wear mask but I don't" (04/22/2014)   Peripheral neuropathy    Pneumonia    "this is probably the 2nd or 3rd time I've had pneumonia" (04/22/2014)   Recurrent boils    Seasonal asthma    Substance abuse    Thrombocytosis    Hem/Onc suggested 2/2 chronic hepatits and/or iron deficiency anemia   Patient Active Problem List   Diagnosis Date Noted   Heart failure 05/19/2022   Acute kidney injury superimposed on chronic kidney disease 05/18/2022   Acute on chronic systolic CHF (congestive heart failure) 05/17/2022   New onset of congestive heart failure 05/16/2022   Chest pain 03/21/2022   CAD S/P percutaneous coronary angioplasty 03/21/2022   Acute-on-chronic kidney injury  03/21/2022   Acute pulmonary embolism 03/21/2022   Sepsis 04/17/2021   Conjunctivitis 04/17/2021   Abscess 04/16/2021   Abscess of bursa of right foot    Diabetic foot infection 04/06/2020   Cellulitis of foot, right    Puncture wound of right foot    Sepsis due to pneumonia 11/24/2019   Thrombocytosis 11/24/2019   Chronic pain 11/24/2019   History of pulmonary embolism 10/10/2017   Chronic anticoagulation 10/10/2017   GERD (gastroesophageal reflux disease) 10/10/2017   Leukocytosis 10/10/2017   Prolonged QT interval 10/10/2017    Nausea & vomiting 09/20/2017   Hypertensive urgency 09/20/2017   Intractable nausea and vomiting 08/08/2017   Chronic maxillary sinusitis 01/02/2017   Acute blood loss anemia 11/13/2016   Menorrhagia 11/13/2016   Bilateral pulmonary embolism 10/30/2016   Acute DVT (deep venous thrombosis) 10/30/2016   Nausea and vomiting 09/23/2016   Type 2 diabetes mellitus with hyperglycemia 04/07/2016   Diabetic gastroparesis 04/06/2016   Dehydration 03/27/2015   Gastroparesis 11/11/2014   Hematemesis 10/13/2014   DKA (diabetic ketoacidoses) 10/13/2014   Abdominal pain, chronic, left lower quadrant    Diabetic gastroparesis associated with type 2 diabetes mellitus (HCC) 04/28/2014   History of Helicobacter pylori infection 04/22/2014   Vaginal discharge 02/18/2014   Unspecified constipation 07/21/2013   Tinea corporis 07/21/2013   Intractable vomiting 04/29/2013   Dysmenorrhea 04/22/2013   UTI (urinary tract infection) 07/15/2012   Headache(784.0) 02/08/2012   Health care maintenance 01/22/2012   Chronic hepatitis B (HCC) 03/07/2011   History of leukocytosis 04/06/2010   THROMBOCYTOSIS 04/06/2010   Polysubstance abuse (HCC) 02/23/2010   Iron deficiency anemia 11/22/2009   PERIPHERAL NEUROPATHY 10/01/2009   Hyperlipidemia 08/30/2009   Diabetic polyneuropathy (HCC) 08/30/2009   Hidradenitis (recurrent boils) 07/07/2008   Depression 12/27/2007   Abdominal pain, left lower quadrant 11/21/2007   FIBROMYALGIA 10/30/2007   BACK PAIN 04/01/2007   OBSTRUCTIVE SLEEP APNEA 01/17/2007   ANXIETY DEPRESSION 06/27/2006   Chronic ischemic heart disease 06/15/2006   OBESITY, MORBID 05/15/2006   MIGRAINE HEADACHE 05/15/2006   Asthma 05/15/2006   Severe hypertension 01/16/2006   IRREGULAR MENSTRUATION 01/16/2006   PEDAL EDEMA 01/16/2006   Type 2 diabetes mellitus with hyperlipidemia 01/16/1989   Home Medication(s) Prior to Admission medications   Medication Sig Start Date End Date Taking?  Authorizing Provider  acetaminophen (TYLENOL) 500 MG tablet Take 1,000 mg by mouth every 6 (six) hours as needed for mild pain, moderate pain or headache. Patient not taking: Reported on 05/31/2022    [provider]  albuterol (VENTOLIN HFA) 108 (90 Base) MCG/ACT inhaler Inhale 2 puffs into the lungs every 4 (four) hours as needed for wheezing or shortness of breath. 11/30/19   Zannie Cove, MD  amLODipine (NORVASC) 10 MG tablet Take 1 tablet (10 mg total) by mouth daily. 04/26/21   Glade Lloyd, MD  apixaban (ELIQUIS) 5 MG TABS tablet Take 1 tablet (5 mg total) by mouth 2 (two) times daily. 05/23/22   Arrien, York Ram, MD  atorvastatin (LIPITOR) 40 MG tablet Take 1 tablet (40 mg total) by mouth daily. 03/25/22 05/31/22  Zigmund Daniel., MD  Blood Glucose Monitoring Suppl (ONETOUCH VERIO FLEX SYSTEM) w/Device KIT Use in the morning, at noon, and at bedtime. 05/23/22   Arrien, York Ram, MD  Blood Pressure KIT 1 Application by Does not apply route daily at 12 noon. Patient not taking: Reported on 05/31/2022 05/23/22   Arrien, York Ram, MD  carvedilol (COREG) 25 MG tablet Take  1 tablet (25 mg total) by mouth 2 (two) times daily with a meal. 03/24/22 05/31/22  Zigmund Daniel., MD  cyclobenzaprine (FLEXERIL) 10 MG tablet Take 10 mg by mouth 3 (three) times daily as needed for muscle spasms.    [provider]  dapagliflozin propanediol (FARXIGA) 10 MG TABS tablet Take 1 tablet (10 mg total) by mouth daily before breakfast. 06/09/22   Robbie Lis M, PA-C  dicyclomine (BENTYL) 10 MG capsule Take 1 capsule (10 mg total) by mouth 4 (four) times daily -  before meals and at bedtime. 08/20/21   Elson Areas, PA-C  diphenhydrAMINE (BENADRYL) 25 MG tablet Take 25 mg by mouth every 6 (six) hours as needed for allergies.    [provider]  famotidine (PEPCID) 20 MG tablet Take 20 mg by mouth every evening. 04/11/21   [provider]  furosemide  (LASIX) 20 MG tablet Take 3 tablets (60 mg total) by mouth daily. 05/24/22   Arrien, York Ram, MD  gabapentin (NEURONTIN) 400 MG capsule Take 1 capsule (400 mg total) by mouth daily at 6 (six) AM. Patient not taking: Reported on 05/31/2022 04/26/21   Glade Lloyd, MD  glucose blood (ONETOUCH VERIO) test strip Use as instructed to check blood sugar 3 times daily. Dx codes: E11.8, E11.65 12/13/18   Hoy Register, MD  hydrALAZINE (APRESOLINE) 50 MG tablet Take 1 tablet (50 mg total) by mouth every 8 (eight) hours. 05/31/22   Robbie Lis M, PA-C  Insulin Pen Needle (PEN NEEDLES 3/16") 31G X 5 MM MISC 1 Device by Does not apply route 4 (four) times daily - after meals and at bedtime. 11/28/18   Grayce Sessions, NP  INSULIN SYRINGE .5CC/28G (INS SYRINGE/NEEDLE .5CC/28G) 28G X 1/2" 0.5 ML MISC Please provide 1 month supply 09/04/17   Scherrie Gerlach, MD  isosorbide mononitrate (IMDUR) 60 MG 24 hr tablet Take 1 tablet (60 mg total) by mouth daily. 05/31/22   Allayne Butcher, PA-C  linaclotide [facility]) 145 MCG CAPS capsule Take 145 mcg by mouth daily as needed (for constipation).    [provider]  meclizine (ANTIVERT) 25 MG tablet Take 1 tablet (25 mg total) by mouth 3 (three) times daily as needed for dizziness. 04/26/21   Glade Lloyd, MD  MOUNJARO 7.5 MG/0.5ML Pen Inject 7.5 mg into the skin every Monday. 04/20/22   [provider]  omeprazole (PRILOSEC) 40 MG capsule Take 40 mg by mouth at bedtime. 03/10/21   [provider]  ondansetron (ZOFRAN-ODT) 4 MG disintegrating tablet Take 4 mg by mouth daily as needed for nausea or vomiting (DISSOLVE ORALLY).    [provider]  OneTouch Delica Lancets 33G MISC Use to check blood sugar 3 times daily. Dx codes: E11.8, E11.65 December 13, 2018   Hoy Register, MD  oxyCODONE-acetaminophen (PERCOCET) 10-325 MG tablet Take 1 tablet by mouth 3 (three) times daily as needed for pain.    [provider]  promethazine  (PHENERGAN) 25 MG tablet Take 25 mg by mouth every 6 (six) hours as needed for nausea or vomiting.    [provider]  senna (SENOKOT) 8.6 MG TABS tablet Take 1 tablet (8.6 mg total) by mouth 2 (two) times daily. Patient taking differently: Take 1 tablet by mouth 2 (two) times daily as needed for mild constipation. 04/26/21   Glade Lloyd, MD  Past Surgical History Past Surgical History:  Procedure Laterality Date   CESAREAN SECTION  1997   CORONARY ANGIOPLASTY WITH STENT PLACEMENT  2008   "2 stents"   ESOPHAGOGASTRODUODENOSCOPY N/A 04/23/2014   Procedure: ESOPHAGOGASTRODUODENOSCOPY (EGD);  Surgeon: Vertell Novak., MD;  Location: [facility] ENDOSCOPY;  Service: Endoscopy;  Laterality: N/A;   INCISION AND DRAINAGE ABSCESS Right 04/17/2021   Procedure: INCISION AND DRAINAGE CHEST WALL ABSCESS;  Surgeon: Diamantina Monks, MD;  Location: [facility] OR;  Service: General;  Laterality: Right;   IR ANGIOGRAM PELVIS SELECTIVE OR SUPRASELECTIVE  12/07/2016   IR ANGIOGRAM PELVIS SELECTIVE OR SUPRASELECTIVE  12/07/2016   IR ANGIOGRAM SELECTIVE EACH ADDITIONAL VESSEL  12/07/2016   IR ANGIOGRAM SELECTIVE EACH ADDITIONAL VESSEL  12/07/2016   IR EMBO ARTERIAL NOT HEMORR HEMANG INC GUIDE ROADMAPPING  12/07/2016   IR RADIOLOGIST EVAL & MGMT  01/09/2017   IR US GUIDE VASC ACCESS RIGHT  12/07/2016   IRRIGATION AND DEBRIDEMENT FOOT Right 04/07/2020   Procedure: Incision and Drainage Right Foot;  Surgeon: Park Liter, DPM;  Location: [facility] ORS;  Service: Podiatry;  Laterality: Right;   Family History Family History  Problem Relation Age of Onset   Diabetes Father    Healthy Mother     Social History Social History   Tobacco Use   Smoking status: Former    Types: Cigarettes    Quit date: 04/24/1996    Years since quitting: 26.3   Smokeless tobacco: Never   Tobacco comments:     quit smoking cigarettes age 20  Vaping Use   Vaping Use: Never used  Substance Use Topics   Alcohol use: No    Alcohol/week: 0.0 standard drinks of alcohol    Comment: 04/22/2014 "might have a few drinks a month"   Drug use: Not Currently    Types: Marijuana, Cocaine    Comment: 04/22/2104 "quit drugs ~ 1-2 yr ago"   Allergies Lisinopril and Morphine and related  Review of Systems Review of Systems  Constitutional:  Positive for fatigue. Negative for activity change and fever.  HENT:  Negative for facial swelling and trouble swallowing.   Eyes:  Negative for discharge and redness.  Respiratory:  Positive for cough and shortness of breath.   Cardiovascular:  Positive for leg swelling. Negative for chest pain and palpitations.  Gastrointestinal:  Negative for abdominal pain and nausea.  Genitourinary:  Negative for dysuria and flank pain.  Musculoskeletal:  Negative for back pain and gait problem.  Skin:  Negative for pallor and rash.  Neurological:  Negative for syncope and headaches.    Physical Exam Vital Signs  I have reviewed the triage vital signs BP (!) 184/116 (BP Location: Right Arm)   Pulse 84   Temp 98.2 F (36.8 C)   Resp (!) 26   SpO2 99%  Physical Exam Vitals and nursing note reviewed.  Constitutional:      General: She is not in acute distress.    Appearance: Normal appearance.  HENT:     Head: Normocephalic and atraumatic.     Right Ear: External ear normal.     Left Ear: External ear normal.     Nose: Nose normal.     Mouth/Throat:     Mouth: Mucous membranes are moist.  Eyes:     General: No scleral icterus.       Right eye: No discharge.        Left eye: No discharge.  Cardiovascular:     Rate  and Rhythm: Normal rate and regular rhythm.     Pulses: Normal pulses.     Heart sounds: Normal heart sounds.  Pulmonary:     Effort: Pulmonary effort is normal. No respiratory distress.     Breath sounds: Rales present.  Abdominal:     General:  Abdomen is flat.     Tenderness: There is no abdominal tenderness.  Musculoskeletal:     Right lower leg: Edema (3+ pitting to thigh) present.     Left lower leg: Edema (3+ pitting to thigh) present.  Skin:    General: Skin is warm and dry.     Capillary Refill: Capillary refill takes less than 2 seconds.  Neurological:     Mental Status: She is alert.  Psychiatric:        Mood and Affect: Mood normal.        Behavior: Behavior normal.     ED Results and Treatments Labs (all labs ordered are listed, but only abnormal results are displayed) Labs Reviewed  BASIC METABOLIC PANEL - Abnormal; Notable for the following components:      Result Value   Potassium 5.7 (*)    CO2 19 (*)    BUN 45 (*)    Creatinine, Ser 2.78 (*)    Calcium 8.7 (*)    GFR, Estimated 20 (*)    All other components within normal limits  CBC - Abnormal; Notable for the following components:   RBC 3.75 (*)    Hemoglobin 9.4 (*)    HCT 27.7 (*)    MCV 73.9 (*)    MCH 25.1 (*)    RDW 17.7 (*)    Platelets 412 (*)    All other components within normal limits  BRAIN NATRIURETIC PEPTIDE - Abnormal; Notable for the following components:   B Natriuretic Peptide 2,047.2 (*)    All other components within normal limits  TROPONIN I (HIGH SENSITIVITY) - Abnormal; Notable for the following components:   Troponin I (High Sensitivity) 31 (*)    All other components within normal limits  PREGNANCY, URINE                                                                                                                          Radiology DG Chest 2 View  Result Date: 08/07/2022 CLINICAL DATA:  Shortness of breath and chest pain. EXAM: CHEST - 2 VIEW COMPARISON:  05/18/2022 FINDINGS: Cardiomegaly is similar. Small pleural effusions with fluid in the fissures. There is vascular congestion. No convincing pulmonary edema. No confluent consolidation. No pneumothorax. IMPRESSION: Cardiomegaly with vascular congestion and  small pleural effusions. Findings consistent with CHF. Electronically Signed   By: Narda Rutherford M.D.   On: 08/07/2022 22:04    Pertinent labs & imaging results that were available during my care of the patient were reviewed by me and considered in my medical decision making (see MDM for details).  Medications Ordered in ED Medications  nitroGLYCERIN 50 mg in  dextrose 5 % 250 mL (0.2 mg/mL) infusion (has no administration in time range)  furosemide (LASIX) injection 80 mg (has no administration in time range)  sodium zirconium cyclosilicate (LOKELMA) packet 10 g (has no administration in time range)                                                                                                                                     Procedures .Critical Care  Performed by: Sloan Leiter, DO Authorized by: Sloan Leiter, DO   Critical care provider statement:    Critical care time (minutes):  44   Critical care time was exclusive of:  Separately billable procedures and treating other patients   Critical care was necessary to treat or prevent imminent or life-threatening deterioration of the following conditions:  Cardiac failure   Critical care was time spent personally by me on the following activities:  Development of treatment plan with patient or surrogate, discussions with consultants, evaluation of patient's response to treatment, examination of patient, ordering and review of laboratory studies, ordering and review of radiographic studies, ordering and performing treatments and interventions, pulse oximetry, re-evaluation of patient's condition, review of old charts and obtaining history from patient or surrogate   Care discussed with: admitting provider     (including critical care time)  Medical Decision Making / ED Course    Medical Decision Making:    Bryan Omura Westerfeld is a 47 y.o. female with past medical history as below, significant for combined heart failure, CAD, CVA,  DM2, GERD, HBc, obesity, OSA noncompliant with CPAP, DVT on DOAC who presents to the ED with complaint of dyspnea.. The complaint involves an extensive differential diagnosis and also carries with it a high risk of complications and morbidity.  Serious etiology was considered. Ddx includes but is not limited to: In my evaluation of this patient's dyspnea my DDx includes, but is not limited to, pneumonia, pulmonary embolism, pneumothorax, pulmonary edema, metabolic acidosis, asthma, COPD, cardiac cause, anemia, anxiety, etc.    Complete initial physical exam performed, notably the patient  was tachypnea, rales on exam, appears volume overloaded, BP 200 systolic.    Reviewed and confirmed nursing documentation for past medical history, family history, social history.  Vital signs reviewed.    Clinical Course as of 08/07/22 2246  Mon Aug 07, 2022  2231 Creatinine(!): 2.78 Similar to baseline [SG]  2231 Hemoglobin(!): 9.4 Similar to baseline of 10 [SG]  2236 B Natriuretic Peptide(!): 2,047.2 [SG]  2239 Troponin I (High Sensitivity)(!): 31 [SG]  2239 Trop mildly elevated, BNP severely elevated, chest x-ray with evidence of CHF.  Systemically she appears volume overloaded.  She is hypertensive.  Start nitroglycerin, give Lasix.  Apply nasal cannula 2 L.  Admit patient [SG]    Clinical Course User Index [SG] Sloan Leiter, DO       Additional history obtained: -Additional history obtained from family -External  records from outside source obtained and reviewed including: Chart review including previous notes, labs, imaging, consultation notes including primary care documentation, prior labs and imaging, home medications, prior admission documentation Follows with Dr. Elease Hashimoto,   Lab Tests: -I ordered, reviewed, and interpreted labs.   The pertinent results include:   Labs Reviewed  BASIC METABOLIC PANEL - Abnormal; Notable for the following components:      Result Value   Potassium 5.7 (*)     CO2 19 (*)    BUN 45 (*)    Creatinine, Ser 2.78 (*)    Calcium 8.7 (*)    GFR, Estimated 20 (*)    All other components within normal limits  CBC - Abnormal; Notable for the following components:   RBC 3.75 (*)    Hemoglobin 9.4 (*)    HCT 27.7 (*)    MCV 73.9 (*)    MCH 25.1 (*)    RDW 17.7 (*)    Platelets 412 (*)    All other components within normal limits  BRAIN NATRIURETIC PEPTIDE - Abnormal; Notable for the following components:   B Natriuretic Peptide 2,047.2 (*)    All other components within normal limits  TROPONIN I (HIGH SENSITIVITY) - Abnormal; Notable for the following components:   Troponin I (High Sensitivity) 31 (*)    All other components within normal limits  PREGNANCY, URINE    Notable for as above, BNP ++, trop +  EKG   EKG Interpretation  Date/Time:  Monday August 07 2022 21:19:49 EDT Ventricular Rate:  85 PR Interval:  128 QRS Duration: 72 QT Interval:  364 QTC Calculation: 433 R Axis:   81 Text Interpretation: Normal sinus rhythm Low voltage QRS Cannot rule out Anterior infarct , age undetermined Abnormal ECG When compared with ECG of 17-May-2022 10:32, PREVIOUS ECG IS PRESENT no stemi Confirmed by Tanda Rockers ([id number]) on 08/07/2022 10:43:36 PM         Imaging Studies ordered: I ordered imaging studies including cxr I independently visualized the following imaging with scope of interpretation limited to determining acute life threatening conditions related to emergency care; findings noted above, significant for CHF I independently visualized and interpreted imaging. I agree with the radiologist interpretation   Medicines ordered and prescription drug management: Meds ordered this encounter  Medications   nitroGLYCERIN 50 mg in dextrose 5 % 250 mL (0.2 mg/mL) infusion   furosemide (LASIX) injection 80 mg   sodium zirconium cyclosilicate (LOKELMA) packet 10 g    -I have reviewed the patients home medicines and have made adjustments as  needed   Consultations Obtained: na   Cardiac Monitoring: The patient was maintained on a cardiac monitor.  I personally viewed and interpreted the cardiac monitored which showed an underlying rhythm of: NSR  Social Determinants of Health:  Diagnosis or treatment significantly limited by social determinants of health: former smoker and obesity   Reevaluation: After the interventions noted above, I reevaluated the patient and found that they have improved  Co morbidities that complicate the patient evaluation  Past Medical History:  Diagnosis Date   Abscess of tunica vaginalis    10/09- Abundant S. aureus- sensitive to all abx   Anxiety    Blood dyscrasia    CAD (coronary artery disease) 06/15/2006   s/p Subendocardial MI with PDA angioplasty(no stent) on 06/15/06 and relook  cath 06/19/06 showed patency of site. Cath 12/10- no restenosis or significant CAD progression   CVA (cerebral vascular accident) ~ 02/2014  denies residual on 04/22/2014   CVA (cerebral vascular accident)    history of remote right cerebellar infarct noted on head CT at least since 10/2011   Depression    Diabetes mellitus type 2, uncontrolled, with complications    Fibromyalgia    Gastritis    Gastroparesis    secondary to poorly controlled DM, last emptying study performed 01/2010  was normal but may be falsely positive as pt was on reglan   GERD (gastroesophageal reflux disease)    Hepatitis B, chronic    Hep BeAb+,Hep B cAb+ & Hep BsAg+ (9/06)   History of pyelonephritis    H/o GrpB Pyelonephritis (9/06) and UTI- 07/11- E.Coli, 12/10- GBS   Hyperlipidemia    Hypertension    Iron deficiency anemia    Irregular menses    Small ovarian follicles seen on CT(9/06)   MI (myocardial infarction) 05/2006   PDA percutaneous transluminal coronary angioplasty   Migraine    "weekly" (04/22/2014)   N&V (nausea and vomiting)    Chronic. Unclear etiology with multiple admission and ED visits. CT abdomen  with and without contrast (02/2011)  showed no acute process. Gastic Emptying scan (01/2010) was normal. Ultrasound of the abdomen was within normal limits. Hepatitis B viral load was undectable. HIV NR. EGD - gastritis, Hpylori + s/p Rx   New onset of congestive heart failure 05/16/2022   Obesity    OSA (obstructive sleep apnea)    "suppose to wear mask but I don't" (04/22/2014)   Peripheral neuropathy    Pneumonia    "this is probably the 2nd or 3rd time I've had pneumonia" (04/22/2014)   Recurrent boils    Seasonal asthma    Substance abuse    Thrombocytosis    Hem/Onc suggested 2/2 chronic hepatits and/or iron deficiency anemia      Dispostion: Disposition decision including need for hospitalization was considered, and patient admitted to the hospital.    Final Clinical Impression(s) / ED Diagnoses Final diagnoses:  Acute congestive heart failure, unspecified heart failure type  Hyperkalemia  Dyspnea, unspecified type     This chart was dictated using voice recognition software.  Despite best efforts to proofread,  errors can occur which can change the documentation meaning.

## 2022-08-07 NOTE — ED Notes (Signed)
Patient assisted back to bed.

## 2022-08-08 ENCOUNTER — Other Ambulatory Visit (HOSPITAL_COMMUNITY): Payer: Self-pay

## 2022-08-08 DIAGNOSIS — I10 Essential (primary) hypertension: Secondary | ICD-10-CM | POA: Diagnosis not present

## 2022-08-08 DIAGNOSIS — J45909 Unspecified asthma, uncomplicated: Secondary | ICD-10-CM | POA: Diagnosis present

## 2022-08-08 DIAGNOSIS — Z9861 Coronary angioplasty status: Secondary | ICD-10-CM

## 2022-08-08 DIAGNOSIS — N184 Chronic kidney disease, stage 4 (severe): Secondary | ICD-10-CM | POA: Diagnosis present

## 2022-08-08 DIAGNOSIS — I5043 Acute on chronic combined systolic (congestive) and diastolic (congestive) heart failure: Secondary | ICD-10-CM

## 2022-08-08 DIAGNOSIS — G8929 Other chronic pain: Secondary | ICD-10-CM

## 2022-08-08 DIAGNOSIS — I2510 Atherosclerotic heart disease of native coronary artery without angina pectoris: Secondary | ICD-10-CM

## 2022-08-08 DIAGNOSIS — I2489 Other forms of acute ischemic heart disease: Secondary | ICD-10-CM | POA: Diagnosis present

## 2022-08-08 DIAGNOSIS — R0789 Other chest pain: Secondary | ICD-10-CM | POA: Diagnosis not present

## 2022-08-08 DIAGNOSIS — E1169 Type 2 diabetes mellitus with other specified complication: Secondary | ICD-10-CM | POA: Diagnosis present

## 2022-08-08 DIAGNOSIS — E875 Hyperkalemia: Secondary | ICD-10-CM | POA: Diagnosis present

## 2022-08-08 DIAGNOSIS — I43 Cardiomyopathy in diseases classified elsewhere: Secondary | ICD-10-CM | POA: Diagnosis present

## 2022-08-08 DIAGNOSIS — E785 Hyperlipidemia, unspecified: Secondary | ICD-10-CM

## 2022-08-08 DIAGNOSIS — I130 Hypertensive heart and chronic kidney disease with heart failure and stage 1 through stage 4 chronic kidney disease, or unspecified chronic kidney disease: Secondary | ICD-10-CM | POA: Diagnosis present

## 2022-08-08 DIAGNOSIS — I161 Hypertensive emergency: Secondary | ICD-10-CM | POA: Diagnosis present

## 2022-08-08 DIAGNOSIS — E872 Acidosis, unspecified: Secondary | ICD-10-CM | POA: Diagnosis present

## 2022-08-08 DIAGNOSIS — E1122 Type 2 diabetes mellitus with diabetic chronic kidney disease: Secondary | ICD-10-CM | POA: Diagnosis present

## 2022-08-08 DIAGNOSIS — I5023 Acute on chronic systolic (congestive) heart failure: Secondary | ICD-10-CM

## 2022-08-08 DIAGNOSIS — N179 Acute kidney failure, unspecified: Secondary | ICD-10-CM | POA: Diagnosis present

## 2022-08-08 DIAGNOSIS — I2781 Cor pulmonale (chronic): Secondary | ICD-10-CM | POA: Diagnosis present

## 2022-08-08 DIAGNOSIS — B181 Chronic viral hepatitis B without delta-agent: Secondary | ICD-10-CM | POA: Diagnosis present

## 2022-08-08 DIAGNOSIS — I160 Hypertensive urgency: Secondary | ICD-10-CM | POA: Diagnosis not present

## 2022-08-08 DIAGNOSIS — E669 Obesity, unspecified: Secondary | ICD-10-CM | POA: Diagnosis present

## 2022-08-08 DIAGNOSIS — Z86711 Personal history of pulmonary embolism: Secondary | ICD-10-CM

## 2022-08-08 DIAGNOSIS — F1411 Cocaine abuse, in remission: Secondary | ICD-10-CM | POA: Diagnosis present

## 2022-08-08 DIAGNOSIS — E1142 Type 2 diabetes mellitus with diabetic polyneuropathy: Secondary | ICD-10-CM | POA: Diagnosis present

## 2022-08-08 DIAGNOSIS — D509 Iron deficiency anemia, unspecified: Secondary | ICD-10-CM | POA: Diagnosis present

## 2022-08-08 DIAGNOSIS — E1165 Type 2 diabetes mellitus with hyperglycemia: Secondary | ICD-10-CM | POA: Diagnosis present

## 2022-08-08 DIAGNOSIS — I420 Dilated cardiomyopathy: Secondary | ICD-10-CM | POA: Diagnosis present

## 2022-08-08 DIAGNOSIS — I081 Rheumatic disorders of both mitral and tricuspid valves: Secondary | ICD-10-CM | POA: Diagnosis present

## 2022-08-08 DIAGNOSIS — I2721 Secondary pulmonary arterial hypertension: Secondary | ICD-10-CM | POA: Diagnosis present

## 2022-08-08 DIAGNOSIS — I3139 Other pericardial effusion (noninflammatory): Secondary | ICD-10-CM | POA: Diagnosis present

## 2022-08-08 DIAGNOSIS — D631 Anemia in chronic kidney disease: Secondary | ICD-10-CM | POA: Diagnosis present

## 2022-08-08 DIAGNOSIS — F32A Depression, unspecified: Secondary | ICD-10-CM | POA: Diagnosis present

## 2022-08-08 LAB — BASIC METABOLIC PANEL
Anion gap: 13 (ref 5–15)
BUN: 43 mg/dL — ABNORMAL HIGH (ref 6–20)
CO2: 19 mmol/L — ABNORMAL LOW (ref 22–32)
Calcium: 8.3 mg/dL — ABNORMAL LOW (ref 8.9–10.3)
Chloride: 109 mmol/L (ref 98–111)
Creatinine, Ser: 2.86 mg/dL — ABNORMAL HIGH (ref 0.44–1.00)
GFR, Estimated: 20 mL/min — ABNORMAL LOW (ref >60)
Glucose, Bld: 84 mg/dL (ref 70–99)
Potassium: 3.7 mmol/L (ref 3.5–5.1)
Sodium: 141 mmol/L (ref 135–145)

## 2022-08-08 LAB — GLUCOSE, CAPILLARY
Glucose-Capillary: 147 mg/dL — ABNORMAL HIGH (ref 70–99)
Glucose-Capillary: 170 mg/dL — ABNORMAL HIGH (ref 70–99)
Glucose-Capillary: 164 mg/dL — ABNORMAL HIGH (ref 70–99)

## 2022-08-08 LAB — MRSA NEXT GEN BY PCR, NASAL: MRSA by PCR Next Gen: DETECTED — AB

## 2022-08-08 LAB — TROPONIN I (HIGH SENSITIVITY): Troponin I (High Sensitivity): 32 ng/L — ABNORMAL HIGH (ref <18)

## 2022-08-08 MED ORDER — AMLODIPINE BESYLATE 10 MG PO TABS
10.0000 mg | ORAL_TABLET | Freq: Every day | ORAL | Status: DC
  Administered 2022-08-08 – 2022-08-15 (×8): 10 mg via ORAL
  Filled 2022-08-08: qty 1
  Filled 2022-08-08: qty 2
  Filled 2022-08-08 (×6): qty 1

## 2022-08-08 MED ORDER — DAPAGLIFLOZIN PROPANEDIOL 10 MG PO TABS
10.0000 mg | ORAL_TABLET | Freq: Every day | ORAL | Status: DC
  Administered 2022-08-08 – 2022-08-11 (×4): 10 mg via ORAL
  Filled 2022-08-08 (×4): qty 1

## 2022-08-08 MED ORDER — FAMOTIDINE 20 MG PO TABS
20.0000 mg | ORAL_TABLET | Freq: Every evening | ORAL | Status: DC
  Administered 2022-08-08 – 2022-08-14 (×7): 20 mg via ORAL
  Filled 2022-08-08 (×7): qty 1

## 2022-08-08 MED ORDER — INSULIN ASPART 100 UNIT/ML IJ SOLN
0.0000 [IU] | Freq: Three times a day (TID) | INTRAMUSCULAR | Status: DC
  Administered 2022-08-08 – 2022-08-11 (×3): 1 [IU] via SUBCUTANEOUS
  Administered 2022-08-12: 2 [IU] via SUBCUTANEOUS
  Administered 2022-08-12 – 2022-08-13 (×2): 1 [IU] via SUBCUTANEOUS
  Administered 2022-08-14: 2 [IU] via SUBCUTANEOUS
  Administered 2022-08-14 – 2022-08-15 (×3): 1 [IU] via SUBCUTANEOUS

## 2022-08-08 MED ORDER — SODIUM CHLORIDE 0.9% FLUSH: 3.0000 mL | INTRAVENOUS | Status: DC | PRN

## 2022-08-08 MED ORDER — SODIUM CHLORIDE 0.9 % IV SOLN: 250.0000 mL | INTRAVENOUS | Status: DC | PRN

## 2022-08-08 MED ORDER — FUROSEMIDE 10 MG/ML IJ SOLN
60.0000 mg | Freq: Two times a day (BID) | INTRAMUSCULAR | Status: DC
  Administered 2022-08-08 – 2022-08-12 (×8): 60 mg via INTRAVENOUS
  Filled 2022-08-08 (×8): qty 6

## 2022-08-08 MED ORDER — APIXABAN 5 MG PO TABS
5.0000 mg | ORAL_TABLET | Freq: Two times a day (BID) | ORAL | Status: DC
  Administered 2022-08-08 – 2022-08-12 (×9): 5 mg via ORAL
  Filled 2022-08-08 (×9): qty 1

## 2022-08-08 MED ORDER — OXYCODONE-ACETAMINOPHEN 5-325 MG PO TABS
1.0000 | ORAL_TABLET | Freq: Four times a day (QID) | ORAL | Status: DC | PRN
  Administered 2022-08-08 – 2022-08-14 (×9): 1 via ORAL
  Filled 2022-08-08 (×9): qty 1

## 2022-08-08 MED ORDER — CARVEDILOL 25 MG PO TABS
25.0000 mg | ORAL_TABLET | Freq: Two times a day (BID) | ORAL | Status: DC
  Administered 2022-08-08 – 2022-08-15 (×15): 25 mg via ORAL
  Filled 2022-08-08 (×16): qty 1

## 2022-08-08 MED ORDER — OXYCODONE HCL 5 MG PO TABS
5.0000 mg | ORAL_TABLET | Freq: Four times a day (QID) | ORAL | Status: DC | PRN
  Administered 2022-08-08 – 2022-08-14 (×6): 5 mg via ORAL
  Filled 2022-08-08 (×6): qty 1

## 2022-08-08 MED ORDER — FUROSEMIDE 10 MG/ML IJ SOLN
60.0000 mg | Freq: Every day | INTRAMUSCULAR | Status: DC
  Administered 2022-08-08: 60 mg via INTRAVENOUS
  Filled 2022-08-08: qty 6

## 2022-08-08 MED ORDER — HYDRALAZINE HCL 50 MG PO TABS
100.0000 mg | ORAL_TABLET | Freq: Three times a day (TID) | ORAL | Status: DC
  Administered 2022-08-08 – 2022-08-15 (×21): 100 mg via ORAL
  Filled 2022-08-08 (×21): qty 2

## 2022-08-08 MED ORDER — SODIUM CHLORIDE 0.9% FLUSH
3.0000 mL | Freq: Two times a day (BID) | INTRAVENOUS | Status: DC
  Administered 2022-08-08 – 2022-08-13 (×11): 3 mL via INTRAVENOUS

## 2022-08-08 MED ORDER — HYDROMORPHONE HCL 1 MG/ML IJ SOLN
0.5000 mg | Freq: Once | INTRAMUSCULAR | Status: AC
  Administered 2022-08-08: 0.5 mg via INTRAVENOUS
  Filled 2022-08-08: qty 1

## 2022-08-08 MED ORDER — HYDRALAZINE HCL 50 MG PO TABS
50.0000 mg | ORAL_TABLET | Freq: Three times a day (TID) | ORAL | Status: DC
  Administered 2022-08-08 (×3): 50 mg via ORAL
  Filled 2022-08-08: qty 1
  Filled 2022-08-08: qty 2
  Filled 2022-08-08: qty 1

## 2022-08-08 MED ORDER — ISOSORBIDE MONONITRATE ER 60 MG PO TB24
60.0000 mg | ORAL_TABLET | Freq: Every day | ORAL | Status: DC
  Administered 2022-08-08 – 2022-08-13 (×6): 60 mg via ORAL
  Filled 2022-08-08 (×6): qty 1

## 2022-08-08 MED ORDER — OXYCODONE-ACETAMINOPHEN 10-325 MG PO TABS: 1.0000 | ORAL_TABLET | Freq: Four times a day (QID) | ORAL | Status: DC | PRN

## 2022-08-08 MED ORDER — PANTOPRAZOLE SODIUM 40 MG PO TBEC
40.0000 mg | DELAYED_RELEASE_TABLET | Freq: Every day | ORAL | Status: DC
  Administered 2022-08-08 – 2022-08-15 (×8): 40 mg via ORAL
  Filled 2022-08-08 (×8): qty 1

## 2022-08-08 MED ORDER — LINACLOTIDE 145 MCG PO CAPS
145.0000 ug | ORAL_CAPSULE | Freq: Every day | ORAL | Status: DC | PRN
  Filled 2022-08-08: qty 1

## 2022-08-08 MED ORDER — ONDANSETRON HCL 4 MG/2ML IJ SOLN: 4.0000 mg | Freq: Once | INTRAMUSCULAR | Status: DC

## 2022-08-08 MED ORDER — ATORVASTATIN CALCIUM 40 MG PO TABS
40.0000 mg | ORAL_TABLET | Freq: Every day | ORAL | Status: DC
  Administered 2022-08-08 – 2022-08-15 (×8): 40 mg via ORAL
  Filled 2022-08-08 (×8): qty 1

## 2022-08-08 MED ORDER — ISOSORBIDE DINITRATE 20 MG PO TABS
20.0000 mg | ORAL_TABLET | Freq: Three times a day (TID) | ORAL | Status: DC
  Administered 2022-08-08: 20 mg via ORAL
  Filled 2022-08-08 (×3): qty 1

## 2022-08-08 MED ORDER — SENNA 8.6 MG PO TABS
1.0000 | ORAL_TABLET | Freq: Two times a day (BID) | ORAL | Status: DC | PRN
  Administered 2022-08-14: 8.6 mg via ORAL
  Filled 2022-08-08: qty 1

## 2022-08-08 MED ORDER — LABETALOL HCL 5 MG/ML IV SOLN
20.0000 mg | Freq: Once | INTRAVENOUS | Status: AC
  Administered 2022-08-08: 20 mg via INTRAVENOUS
  Filled 2022-08-08: qty 4

## 2022-08-08 MED ORDER — MECLIZINE HCL 25 MG PO TABS: 25.0000 mg | ORAL_TABLET | Freq: Three times a day (TID) | ORAL | Status: DC | PRN

## 2022-08-08 MED ORDER — ACETAMINOPHEN 325 MG PO TABS: 650.0000 mg | ORAL_TABLET | ORAL | Status: DC | PRN

## 2022-08-08 NOTE — ED Notes (Signed)
Patient Nitro dose dropped to per Dr. Read Drivers

## 2022-08-08 NOTE — Progress Notes (Signed)
Patient seen and examined personally, I reviewed the chart, history and physical and admission note, done by admitting physician this morning and agree with the same with following addendum.  Please refer to the morning admission note for more detailed plan of care.  Briefly,  47 y.o.f w/ Chronic HFrEF (45-50%, PAH), CKD 4, CAD s/p PCI, stroke, DM2, OSA, h/o DVT/PE on eliquis presented to the ED with complaint of dyspnea orthopnea and bilateral lower leg swelling and about 20 pound weight gain over past 2 days In E C: BP 190-200 systolic on arrival.  Labs with BNP elevated 2047, troponin 31> 32 flat, hyperkalemia 5.7, acidosis bicarb 19, creatinine 2.7 BUN 45 hemoglobin 9.4 platelet 412 pregnancy test negative. Chest x-ray cardiomegaly and vascular congestion with small pleural effusion consistent with CHF. Patient was Given Lasix 80, BP meds, Lokelma and NTG drip and admitted for acute on chronic systolic CHF and severe hypertension.   This am seen at bedside Overnight BP has improved in 120s to 130s range, on room air Labs shows potassium 3.7 bicarb at 19 creatinine 2.8   A/P  Acute on chronic systolic CHF Severe hypertension Chronic cor pulmonale: Overall patient feels she is improving, currently on room air, Admitted on nitroglycerin drip, IV Lasix.  Continue to monitor vitals, intake output and daily weight continue fluid restriction.  ConsultED cardiology.  slowly  WEAN off nitroglycerin pending cardiology evaluation.  She is on amlodipine Coreg hydralazine, Imdur.  CKD stage IV: About the baseline of 2.7 RECENTLY, monitor renal function,electrolytes closely avoid nephrotoxic medication Recent Labs    05/16/22 2004 05/18/22 0111 05/19/22 0134 05/20/22 0031 05/21/22 0851 05/22/22 0043 05/23/22 0123 05/31/22 1528 08/07/22 2129 08/08/22 0550  BUN 29* 27* 30* 36* 46* 45* 48* 37* 45* 43*  CREATININE 2.47* 2.37* 2.79* 3.13* 3.27* 3.41* 3.39* 2.64* 2.78* 2.86*    Hyperkalemia  resolved with Lokelma  CAD with history of percutaneous coronary angioplasty Mildly elevated troponin: Troponins are flat likely demand ischemia from hypertension.Continue Eliquis beta-blocker and statin.  Pending cardiology evaluation  T2DM on Farxiga continue, SSI. PTA on mounjaro No results for input(s): "GLUCAP", "HGBA1C" in the last 168 hours.   History of PE on Eliquis  Chronic pain on Percocet

## 2022-08-08 NOTE — Assessment & Plan Note (Signed)
Cont eliquis, BB, statin

## 2022-08-08 NOTE — ED Notes (Signed)
Report called to [place], [place]

## 2022-08-08 NOTE — Assessment & Plan Note (Signed)
>>  ASSESSMENT AND PLAN FOR NEW ONSET OF CONGESTIVE HEART FAILURE (HCC) WRITTEN ON 09/07/2022  9:42 PM BY Faren Florence T, PA-C  >>ASSESSMENT AND PLAN FOR ACUTE ON CHRONIC SYSTOLIC CHF (CONGESTIVE HEART FAILURE) (HCC) WRITTEN ON 08/08/2022  4:59 AM BY GARDNER, JARED M, DO  Pt with HTN urgency + reported ~20lb wt gain over past few days. EF 45-50% chronic cor pulmonale BP control as per HTN below CHF pathway Tele monitor Got 80mg  IV lasix in ED Will put on 60mg  IV lasix daily for the moment Daily BMP with diuresis Strict intake and output

## 2022-08-08 NOTE — Assessment & Plan Note (Signed)
Cont Farxiga Cont mounjaro once med rec completed (assuming she's still on this) CBG today looks good on BMP.

## 2022-08-08 NOTE — Assessment & Plan Note (Signed)
Cont eliquis 

## 2022-08-08 NOTE — ED Notes (Signed)
-  Paged Critical Care at 130am.

## 2022-08-08 NOTE — ED Notes (Signed)
Spoke [facility] John regarding pt placement at [facility]. Was updated pt on NTG 150, last titrated 153. Given IV Labetalol and PO meds. Per [facility], pt cleared for transfer for 4E15.

## 2022-08-08 NOTE — Assessment & Plan Note (Signed)
Pt with HTN urgency + reported ~20lb wt gain over past few days. EF 45-50% chronic cor pulmonale BP control as per HTN below CHF pathway Tele monitor Got  IV lasix in ED Will put on  IV lasix daily for the moment Daily BMP with diuresis Strict intake and output

## 2022-08-08 NOTE — Hospital Course (Addendum)
47 y.o.f w/ Chronic HFrEF (45-50%, PAH), CKD 4, CAD s/p PCI, stroke, DM2, OSA, h/o DVT/PE on eliquis presented to the ED with complaint of dyspnea orthopnea and bilateral lower leg swelling and about 20 pound weight gain over past 2 days In E C: BP 190-200 systolic on arrival.  Labs with BNP elevated 2047, troponin 31> 32 flat, hyperkalemia 5.7, acidosis bicarb 19, creatinine 2.7 BUN 45 hemoglobin 9.4 platelet 412 pregnancy test negative. Chest x-ray cardiomegaly and vascular congestion with small pleural effusion consistent with CHF. Patient was Given Lasix 80, BP meds, Lokelma and NTG drip and admitted for acute on chronic systolic CHF and severe hypertension. Seen by cardiology continued on nitroglycerin drip, BP meds adjustment IV diuretics without significant improvement, but had worsening renal failure, nephrology was consulted [facility] recommended and planning 4/22

## 2022-08-08 NOTE — Consult Note (Addendum)
Cardiology Consultation   Patient ID: Shirley [name] MRN: [id number]; DOB: 1976/02/16  Admit date: 08/07/2022 Date of Consult: 08/08/2022  PCP:  Bettey Costa, NP   [facility] Providers Cardiologist:  Kristeen Miss, MD    Patient Profile:   Shirley [name] is a 47 y.o. female with a hx of remote MI treated w/PDA angioplasty, CVA, PE/DVT, HTN, IDA, untreated OSA, DM and CKD IV who is being seen 08/08/2022 for the evaluation of CHF at the request of Dr. Jonathon Bellows.  History of Present Illness:   Shirley [name] is a 47 year old female with past medical history noted above.  She has been followed by Dr. Elease Hashimoto as an outpatient.  She was admitted 04/2022 for dyspnea and lower extremity edema.  Found to be in acute heart failure with nonoliguric AKI on CKD.  Hypertensive on admission with systolic blood pressures in the 190s.  Creatinine was elevated at 3.12.  Echo showed mildly reduced LVEF of 45-50 with normal RV size and function.  She was diuresed with IV Lasix and started on antihypertensives.  Seen by nephrology and felt to have diabetic neuropathy complicated by cardiorenal syndrome.  Transitioned to p.o. Lasix 60 mg daily and recommended to trial SGLT2 as an outpatient if renal function remained stable.  Placed on Coreg, hydralazine, Imdur and amlodipine.  Seen in the [facility] heart failure clinic on 05/31/2022 and blood pressures remained elevated.  Continued on Coreg 25 mg twice daily, increased hydralazine to 50 mg 3 times daily, increased Imdur to 60 mg daily with continuation of Lasix 60 mg daily and plans to start Farxiga if stable renal function.   Presented to the ED on 4/15 with complaints of increased dyspnea, orthopnea and bilateral lower extremity edema with a 20 pound weight gain over the past several days. Reports she normally weighs herself, noted weight had increased from 185>>215 over several days.   Clinic admission showed sodium 140, potassium 5.7, creatinine 2.78, BNP 2047,  high-sensitivity troponin 31>32, WBC 7.7, hemoglobin 9.4.  EKG showed sinus rhythm, 85 bpm.  Chest x-ray with small bilateral pleural effusions.  Systolic blood pressure noted greater than 200.  She was admitted to medicine service for further management.  Cardiology asked to evaluate in regards to heart failure.  In talking with patient she reports compliance with her medications, has not run out. Denies increased sodium intake. Weighs at home regularly.   Past Medical History:  Diagnosis Date   Abscess of tunica vaginalis    10/09- Abundant S. aureus- sensitive to all abx   Anxiety    Blood dyscrasia    CAD (coronary artery disease) 06/15/2006   s/p Subendocardial MI with PDA angioplasty(no stent) on 06/15/06 and relook  cath 06/19/06 showed patency of site. Cath 12/10- no restenosis or significant CAD progression   CVA (cerebral vascular accident) ~ 02/2014   denies residual on 04/22/2014   CVA (cerebral vascular accident)    history of remote right cerebellar infarct noted on head CT at least since 10/2011   Depression    Diabetes mellitus type 2, uncontrolled, with complications    Fibromyalgia    Gastritis    Gastroparesis    secondary to poorly controlled DM, last emptying study performed 01/2010  was normal but may be falsely positive as pt was on reglan   GERD (gastroesophageal reflux disease)    Hepatitis B, chronic    Hep BeAb+,Hep B cAb+ & Hep BsAg+ (9/06)   History of pyelonephritis  H/o GrpB Pyelonephritis (9/06) and UTI- 07/11- E.Coli, 12/10- GBS   Hyperlipidemia    Hypertension    Iron deficiency anemia    Irregular menses    Small ovarian follicles seen on CT(9/06)   MI (myocardial infarction) 05/2006   PDA percutaneous transluminal coronary angioplasty   Migraine    "weekly" (04/22/2014)   N&V (nausea and vomiting)    Chronic. Unclear etiology with multiple admission and ED visits. CT abdomen with and without contrast (02/2011)  showed no acute process. Gastic  Emptying scan (01/2010) was normal. Ultrasound of the abdomen was within normal limits. Hepatitis B viral load was undectable. HIV NR. EGD - gastritis, Hpylori + s/p Rx   New onset of congestive heart failure 05/16/2022   Obesity    OSA (obstructive sleep apnea)    "suppose to wear mask but I don't" (04/22/2014)   Peripheral neuropathy    Pneumonia    "this is probably the 2nd or 3rd time I've had pneumonia" (04/22/2014)   Recurrent boils    Seasonal asthma    Substance abuse    Thrombocytosis    Hem/Onc suggested 2/2 chronic hepatits and/or iron deficiency anemia    Past Surgical History:  Procedure Laterality Date   CESAREAN SECTION  1997   CORONARY ANGIOPLASTY WITH STENT PLACEMENT  2008   "2 stents"   ESOPHAGOGASTRODUODENOSCOPY N/A 04/23/2014   Procedure: ESOPHAGOGASTRODUODENOSCOPY (EGD);  Surgeon: Vertell Novak., MD;  Location: [facility] ENDOSCOPY;  Service: Endoscopy;  Laterality: N/A;   INCISION AND DRAINAGE ABSCESS Right 04/17/2021   Procedure: INCISION AND DRAINAGE CHEST WALL ABSCESS;  Surgeon: Diamantina Monks, MD;  Location: [facility] OR;  Service: General;  Laterality: Right;   IR ANGIOGRAM PELVIS SELECTIVE OR SUPRASELECTIVE  12/07/2016   IR ANGIOGRAM PELVIS SELECTIVE OR SUPRASELECTIVE  12/07/2016   IR ANGIOGRAM SELECTIVE EACH ADDITIONAL VESSEL  12/07/2016   IR ANGIOGRAM SELECTIVE EACH ADDITIONAL VESSEL  12/07/2016   IR EMBO ARTERIAL NOT HEMORR HEMANG INC GUIDE ROADMAPPING  12/07/2016   IR RADIOLOGIST EVAL & MGMT  01/09/2017   IR US GUIDE VASC ACCESS RIGHT  12/07/2016   IRRIGATION AND DEBRIDEMENT FOOT Right 04/07/2020   Procedure: Incision and Drainage Right Foot;  Surgeon: Park Liter, DPM;  Location: [facility] ORS;  Service: Podiatry;  Laterality: Right;     Home Medications:  Prior to Admission medications   Medication Sig Start Date End Date Taking? Authorizing Provider  albuterol (VENTOLIN HFA) 108 (90 Base) MCG/ACT inhaler Inhale 2 puffs into the lungs every 4 (four) hours as  needed for wheezing or shortness of breath. 11/30/19   Zannie Cove, MD  amLODipine (NORVASC) 10 MG tablet Take 1 tablet (10 mg total) by mouth daily. 04/26/21   Glade Lloyd, MD  apixaban (ELIQUIS) 5 MG TABS tablet Take 1 tablet (5 mg total) by mouth 2 (two) times daily. 05/23/22   Arrien, York Ram, MD  atorvastatin (LIPITOR) 40 MG tablet Take 1 tablet (40 mg total) by mouth daily. 03/25/22 05/31/22  Zigmund Daniel., MD  Blood Glucose Monitoring Suppl (ONETOUCH VERIO FLEX SYSTEM) w/Device KIT Use in the morning, at noon, and at bedtime. 05/23/22   Arrien, York Ram, MD  carvedilol (COREG) 25 MG tablet Take 1 tablet (25 mg total) by mouth 2 (two) times daily with a meal. 03/24/22 05/31/22  Zigmund Daniel., MD  cyclobenzaprine (FLEXERIL) 10 MG tablet Take 10 mg by mouth 3 (three) times daily as needed for muscle spasms.    [provider]  dapagliflozin propanediol (FARXIGA) 10 MG TABS tablet Take 1 tablet (10 mg total) by mouth daily before breakfast. 06/09/22   Robbie Lis M, PA-C  dicyclomine (BENTYL) 10 MG capsule Take 1 capsule (10 mg total) by mouth 4 (four) times daily -  before meals and at bedtime. 08/20/21   Elson Areas, PA-C  diphenhydrAMINE (BENADRYL) 25 MG tablet Take 25 mg by mouth every 6 (six) hours as needed for allergies.    [provider]  famotidine (PEPCID) 20 MG tablet Take 20 mg by mouth every evening. 04/11/21   [provider]  furosemide (LASIX) 20 MG tablet Take 3 tablets (60 mg total) by mouth daily. 05/24/22   Arrien, York Ram, MD  glucose blood [facility] VERIO) test strip Use as instructed to check blood sugar 3 times daily. Dx codes: E11.8, E11.65 12-22-18   Hoy Register, MD  hydrALAZINE (APRESOLINE) 50 MG tablet Take 1 tablet (50 mg total) by mouth every 8 (eight) hours. 05/31/22   Robbie Lis M, PA-C  Insulin Pen Needle (PEN NEEDLES 3/16") 31G X 5 MM MISC 1 Device by Does not apply route 4 (four) times  daily - after meals and at bedtime. 11/28/18   Grayce Sessions, NP  INSULIN SYRINGE .5CC/28G (INS SYRINGE/NEEDLE .5CC/28G) 28G X 1/2" 0.5 ML MISC Please provide 1 month supply 09/04/17   Scherrie Gerlach, MD  isosorbide mononitrate (IMDUR) 60 MG 24 hr tablet Take 1 tablet (60 mg total) by mouth daily. 05/31/22   Allayne Butcher, PA-C  linaclotide [facility]) 145 MCG CAPS capsule Take 145 mcg by mouth daily as needed (for constipation).    [provider]  meclizine (ANTIVERT) 25 MG tablet Take 1 tablet (25 mg total) by mouth 3 (three) times daily as needed for dizziness. 04/26/21   Glade Lloyd, MD  MOUNJARO 7.5 MG/0.5ML Pen Inject 7.5 mg into the skin every Monday. 04/20/22   [provider]  omeprazole (PRILOSEC) 40 MG capsule Take 40 mg by mouth at bedtime. 03/10/21   [provider]  ondansetron (ZOFRAN-ODT) 4 MG disintegrating tablet Take 4 mg by mouth daily as needed for nausea or vomiting (DISSOLVE ORALLY).    [provider]  OneTouch Delica Lancets 33G MISC Use to check blood sugar 3 times daily. Dx codes: E11.8, E11.65 Dec 22, 2018   Hoy Register, MD  oxyCODONE-acetaminophen (PERCOCET) 10-325 MG tablet Take 1 tablet by mouth 3 (three) times daily as needed for pain.    [provider]  promethazine (PHENERGAN) 25 MG tablet Take 25 mg by mouth every 6 (six) hours as needed for nausea or vomiting.    [provider]  senna (SENOKOT) 8.6 MG TABS tablet Take 1 tablet (8.6 mg total) by mouth 2 (two) times daily. Patient taking differently: Take 1 tablet by mouth 2 (two) times daily as needed for mild constipation. 04/26/21   Glade Lloyd, MD    Inpatient Medications: Scheduled Meds:  amLODipine  10 mg Oral Daily   apixaban  5 mg Oral BID   atorvastatin  40 mg Oral Daily   carvedilol  25 mg Oral BID WC   dapagliflozin propanediol  10 mg Oral QAC breakfast   famotidine  20 mg Oral QPM   furosemide  60 mg Intravenous Daily   hydrALAZINE   50 mg Oral Q8H   insulin aspart  0-6 Units Subcutaneous TID WC   isosorbide mononitrate  60 mg Oral Daily   ondansetron (ZOFRAN) IV  4 mg Intravenous  Once   pantoprazole  40 mg Oral Daily   sodium chloride flush  3 mL Intravenous Q12H   Continuous Infusions:  sodium chloride     nitroGLYCERIN 125 mcg/min (08/08/22 0809)   PRN Meds: sodium chloride, acetaminophen, linaclotide, meclizine, oxyCODONE-acetaminophen **AND** oxyCODONE, senna, sodium chloride flush  Allergies:    Allergies  Allergen Reactions   Morphine And Related Itching and Swelling   Zestril [Lisinopril] Nausea Only and Swelling    Social History:   Social History   Socioeconomic History   Marital status: Single    Spouse name: Not on file   Number of children: 2   Years of education: 11   Highest education level: Not on file  Occupational History   Occupation: other    Comment: unemployed    Comment: disability  Tobacco Use   Smoking status: Former    Types: Cigarettes    Quit date: 04/24/1996    Years since quitting: 26.3   Smokeless tobacco: Never   Tobacco comments:    quit smoking cigarettes age 46  Vaping Use   Vaping Use: Never used  Substance and Sexual Activity   Alcohol use: No    Alcohol/week: 0.0 standard drinks of alcohol    Comment: 04/22/2014 "might have a few drinks a month"   Drug use: Not Currently    Types: Marijuana, Cocaine    Comment: 04/22/2104 "quit drugs ~ 1-2 yr ago"   Sexual activity: Yes    Birth control/protection: None  Other Topics Concern   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         Social Determinants of Health   Financial Resource Strain: Low Risk  (05/18/2022)   Overall Financial Resource Strain (CARDIA)    Difficulty of Paying Living Expenses: Not very hard  Food Insecurity: No Food Insecurity  (08/08/2022)   Hunger Vital Sign    Worried About Running Out of Food in the Last Year: Never true    Ran Out of Food in the Last Year: Never true  Transportation Needs: No Transportation Needs (08/08/2022)   PRAPARE - Administrator, Civil Service (Medical): No    Lack of Transportation (Non-Medical): No  Physical Activity: Not on file  Stress: Not on file  Social Connections: Not on file  Intimate Partner Violence: Not At Risk (08/08/2022)   Humiliation, Afraid, Rape, and Kick questionnaire    Fear of Current or Ex-Partner: No    Emotionally Abused: No    Physically Abused: No    Sexually Abused: No    Family History:    Family History  Problem Relation Age of Onset   Diabetes Father    Healthy Mother      ROS:  Please see the history of present illness.   All other ROS reviewed and negative.     Physical Exam/Data:   Vitals:   08/08/22 0900 08/08/22 1000 08/08/22 1101 08/08/22 1200  BP: 127/66 121/75 133/80 118/64  Pulse:      Resp:      Temp:      TempSrc:      SpO2: 96% 95% 94% 96%  Weight:        Intake/Output Summary (Last 24 hours) at 08/08/2022 1254 Last data filed at 08/08/2022 0930 Gross per 24 hour  Intake 272.29 ml  Output 100 ml  Net 172.29 ml      08/08/2022    5:00 AM 05/31/2022    2:35 PM 05/23/2022    4:58 AM  Last 3 Weights  Weight (lbs) 229 lb 8 oz 209 lb 218 lb 4.8 oz  Weight (kg) 104.1 kg 94.802 kg 99.02 kg     Body mass index is 35.94 kg/m.  General:  Well nourished, well developed, in no acute distress HEENT: normal Neck: no JVD Vascular: No carotid bruits; Distal pulses 2+ bilaterally Cardiac:  normal S1, S2; RRR; no murmur  Lungs:  clear to auscultation bilaterally, no wheezing, rhonchi or rales  Abd: soft, nontender, no hepatomegaly  Ext: 1+ bilateral LE edema Musculoskeletal:  No deformities, BUE and BLE strength normal and equal Skin: warm and dry  Neuro:  CNs 2-12 intact, no focal abnormalities noted Psych:   Normal affect   EKG:  The EKG was personally reviewed and demonstrates:  Sinus Rhythm, 85 bpm Telemetry:  Telemetry was personally reviewed and demonstrates:  Sinus Rhythm, VT alarms are noted, but this is artifact   Relevant CV Studies:  Echo: 02/2022  IMPRESSIONS     1. Left ventricular ejection fraction, by estimation, is 45 to 50%. The  left ventricle has mildly decreased function. The left ventricle  demonstrates global hypokinesis. There is mild concentric left ventricular  hypertrophy. Indeterminate diastolic  filling due to E-A fusion.   2. Right ventricular systolic function is normal. The right ventricular  size is normal. There is mildly elevated pulmonary artery systolic  pressure. The estimated right ventricular systolic pressure is 35.9 mmHg.   3. Left atrial size was moderately dilated.   4. A small pericardial effusion is present. The pericardial effusion is  circumferential.   5. The mitral valve is normal in structure. Mild to moderate mitral valve  regurgitation.   6. Tricuspid valve regurgitation is mild to moderate.   7. The aortic valve is tricuspid. Aortic valve regurgitation is not  visualized. No aortic stenosis is present.   FINDINGS   Left Ventricle: Left ventricular ejection fraction, by estimation, is 45  to 50%. The left ventricle has mildly decreased function. The left  ventricle demonstrates global hypokinesis. The left ventricular internal  cavity size was normal in size. There is   mild concentric left ventricular hypertrophy. Indeterminate diastolic  filling due to E-A fusion.   Right Ventricle: The right ventricular size is normal. No increase in  right ventricular wall thickness. Right ventricular systolic function is  normal. There is mildly elevated pulmonary artery systolic pressure. The  tricuspid regurgitant velocity is 2.87   m/s, and with an assumed right atrial pressure of 3 mmHg, the estimated  right ventricular systolic pressure  is 35.9 mmHg.   Left Atrium: Left atrial size was moderately dilated.   Right Atrium: Right atrial size was normal in size.   Pericardium: A small pericardial effusion is present. The pericardial  effusion is circumferential.   Mitral Valve: The mitral valve is normal in structure. Mild to moderate  mitral valve regurgitation, with centrally-directed jet.   Tricuspid Valve: The tricuspid valve is normal in structure. Tricuspid  valve regurgitation is mild to moderate.   Aortic Valve: The aortic valve is tricuspid. Aortic valve regurgitation is  not visualized. No aortic stenosis is present.   Pulmonic Valve: The pulmonic valve was grossly normal. Pulmonic valve  regurgitation is not visualized.   Aorta: The aortic root and ascending aorta are structurally normal, with  no evidence of dilitation.   IAS/Shunts: No atrial level shunt detected by color flow Doppler.    Laboratory Data:  High Sensitivity Troponin:   Recent Labs  Lab 08/07/22 2129 08/07/22 2315  TROPONINIHS 31* 32*     Chemistry Recent Labs  Lab 08/07/22 2129 08/08/22 0550  NA 140 141  K 5.7* 3.7  CL 111 109  CO2 19* 19*  GLUCOSE 92 84  BUN 45* 43*  CREATININE 2.78* 2.86*  CALCIUM 8.7* 8.3*  GFRNONAA 20* 20*  ANIONGAP 10 13    No results for input(s): "PROT", "ALBUMIN", "AST", "ALT", "ALKPHOS", "BILITOT" in the last 168 hours. Lipids No results for input(s): "CHOL", "TRIG", "HDL", "LABVLDL", "LDLCALC", "CHOLHDL" in the last 168 hours.  Hematology Recent Labs  Lab 08/07/22 2129  WBC 7.7  RBC 3.75*  HGB 9.4*  HCT 27.7*  MCV 73.9*  MCH 25.1*  MCHC 33.9  RDW 17.7*  PLT 412*   Thyroid No results for input(s): "TSH", "FREET4" in the last 168 hours.  BNP Recent Labs  Lab 08/07/22 2129  BNP 2,047.2*    DDimer No results for input(s): "DDIMER" in the last 168 hours.   Radiology/Studies:  DG Chest 2 View  Result Date: 08/07/2022 CLINICAL DATA:  Shortness of breath and chest pain. EXAM:  CHEST - 2 VIEW COMPARISON:  05/18/2022 FINDINGS: Cardiomegaly is similar. Small pleural effusions with fluid in the fissures. There is vascular congestion. No convincing pulmonary edema. No confluent consolidation. No pneumothorax. IMPRESSION: Cardiomegaly with vascular congestion and small pleural effusions. Findings consistent with CHF. Electronically Signed   By: Narda Rutherford M.D.   On: 08/07/2022 22:04     Assessment and Plan:   AMELLIA PANIK is a 47 y.o. female with a hx of remote MI treated w/PDA angioplasty, CVA, PE/DVT, HTN, IDA, untreated OSA, DM and CKD IV who is being seen 08/08/2022 for the evaluation of CHF at the request of Dr. Jonathon Bellows.  HFmrEF -- known LVEF of 45-50%, normal RV size and function. Reports increased weight gain over the past several days. Denies increase sodium intake. BNP  2047, CXR with small bilateral effusions.  -- currently on IV lasix 60mg  daily, will further increase to 60mg  BID -- GDMT: coreg 25mg  BID, hydralazine 50mg  TID, Imdur 60mg  daily, farxiga   CKD IV -- reports plans to follow with nephrology as an outpatient -- baseline Cr 2.6-2.8 most recently, peaked at 3.41 during admission back 04/2022 -- follow BMET  HTN -- severely elevated on admission with systolic BP in the 200s -- currently on IV nitro, coreg 25mg  BID, hydralazine 50mg  TID, Imdur 60mg  daily, amlodipine 10mg  daily -- increase hydralazine to 100mg  TID and wean IV nitro -- check renal artery duplex  Hx of PE -- on Eliquis 5mg  BID  IDA Untreated OSA CVA  Risk Assessment/Risk Scores:      New York Heart Association (NYHA) Functional Class NYHA Class II   For questions or updates, please contact [facility] Please consult [email or web address] for contact info under    Signed, Laverda Page, NP  08/08/2022 12:54 PM  Attending Note:   The patient was seen and examined.  Agree with assessment and plan as noted above.  Changes made to the above note as needed.  Patient  seen and independently examined with Laverda Page, NP.   We discussed all aspects of the encounter. I agree with the assessment and plan as stated above.   Acute on chronic combined systolic and diastolic congestive heart  failure: The patient is admitted with weight gain and worsening shortness of breath.  She has an elevated B natruretic peptide.  I met her several months ago for a very similar admission.  She is on Lasix 60 mg IV twice daily.  She is diuresed over 800 cc during this admission so far. Continue carvedilol.  Will increase hydralazine.  Continue isosorbide.  2.  Hypertensive urgency: Her blood pressure was markedly elevated on admission.  He is on IV nitroglycerin.  Her blood pressure has come down nicely and I think that we can taper her nitroglycerin.  I suspect she was not taking all of her medications.  I agree with checking renal artery duplex scan for renal artery stenosis  3.  History of pulmonary emboli: Continue Eliquis 5 mg twice a day  4.  Chronic kidney disease stage IV: Baseline creatinine seems to be around 2.6-2.8.  This limits our ability to use standard guideline directed medical therapy on her.  Plans per internal medicine.      I have spent a total of 40 minutes with patient reviewing hospital  notes , telemetry, EKGs, labs and examining patient as well as establishing an assessment and plan that was discussed with the patient.  > 50% of time was spent in direct patient care.    Vesta Mixer, Montez Hageman., MD, [facility] 08/08/2022, 2:09 PM [address]  [address] Office [phone] Pager [phone]

## 2022-08-08 NOTE — ED Provider Notes (Signed)
1:35 AM [facility] is at bedside to transport patient to progressive.  The progressive floor is now refusing to take the patient because she is maxed out (200 mcg/min) of nitroglycerin with a blood pressure of 178/106.  Progressive nursing staff believes the patient needs to go to ICU.  [name] care was discussed with Dr. Judeth Horn of pulmonary critical care.  He states we should give the patient 20 mg of labetalol IV and he will put in some orders for oral medications which will hopefully allow [place] to back off the nitroglycerin.  He states if he needs to see the patient in progressive he will gladly do so.     Kyson Kupper, Jonny Ruiz, MD 08/08/22 [phone]

## 2022-08-08 NOTE — ED Notes (Signed)
Patient up to bedside commode for BM with the assistance of this nurse.

## 2022-08-08 NOTE — Assessment & Plan Note (Addendum)
Pt with CKD 4 at baseline now. Creat 2.7 today looks to be her baseline 1) Daily BMP to monitor closely with diuresis for CHF. 2) monitor repeat K this AM, hopefully down following lasix + lokelma

## 2022-08-08 NOTE — Assessment & Plan Note (Signed)
Cont home percocet, verified dosing with PMP aware (looks like pt prescribed 5 tabs a day of percocet 10)

## 2022-08-08 NOTE — Progress Notes (Signed)
   Heart Failure Stewardship Pharmacist Progress Note   PCP: Bettey Costa, NP PCP-Cardiologist: Kristeen Miss, MD    HPI:  47 yo F with PMH of PE/DVT, CAD, T2DM, HLD, CKD, obesity, polysubstance use, OSA, diabetic gastroparesis, and DKA.   Admitted from 03/20/22 to 03/24/22 with acute PE and DVT now taking Eliquis. ECHO at that time showed LVEF 45-50%, mild concentric LVH, RV normal.   Admitted from 05/16/22 to 05/23/22 with acute on chronic CHF exacerbation. Diuresed 5 lbs and discharged with lasix 60 mg daily. Had HF TOC follow up on 2/7. BP was elevated 180/102 and + leg edema. Hydralazine and imdur were increased.   Presented back to the ED on 4/15 with shortness of breath, weight gain, orthopnea, and LE edema over the last 2 days. Hypertensive 190-200s on arrival and started on IV nitroglycerin. CXR with cardiomegaly, vascular congestion, and small pleural effusions. Checking renal artery duplex.  Current HF Medications: Diuretic: furosemide 60 mg IV BID Beta Blocker: carvedilol 25 mg BID SGLT2i: Farxiga 10 mg daily Other: hydralazine 100 mg TID + Imdur 60 mg daily *also on amlodipine 10 mg daily and nitroglycerin drip  Prior to admission HF Medications: Diuretic: furosemide 20 mg TID Beta blocker: metoprolol XL 100 mg daily SGLT2i: Farxiga 10 mg daily Other: hydralazine 50 mg TID + Imdur 60 mg daily *not taking amlodipine, carvedilol,   Pertinent Lab Values: Serum creatinine 2.86, BUN 43, Potassium 3.7, Sodium 141, BNP 2047.2  Vital Signs: Weight: 229 lbs (admission weight: 229 lbs) Blood pressure: 130/70s  Heart rate: 80-90s  I/O: net -0.8L  Medication Assistance / Insurance Benefits Check: Does the patient have prescription insurance?  Yes Type of insurance plan: [place] Medicaid  Outpatient Pharmacy:  Prior to admission outpatient pharmacy: [facility] Is the patient willing to use [facility] TOC pharmacy at discharge? Yes Is the patient willing to transition their outpatient  pharmacy to utilize a [facility] outpatient pharmacy?   Pending    Assessment: 1. Acute on chronic systolic and diastolic CHF (LVEF 45-50%). NYHA class III symptoms. - Continue furosemide 60 mg IV BID. Strict I/Os and daily weights. Keep K>4 and Mg>2. - Agree with starting carvedilol 25 mg BID - Continue Farxiga 10 mg daily  - Agree with increasing to hydralazine 100 mg TID - Continue Imdur 60 mg daily, can increase if needed as nitro drip weans off - Continue amlodipine 10 mg daily  Plan: 1) Medication changes recommended at this time: - Agree with changes  2) Patient assistance: - None pending, has [place] Medicaid  3)  Education  - To be completed prior to discharge  Sharen Hones, PharmD, BCPS Heart Failure Engineer, building services Phone [phone]

## 2022-08-08 NOTE — Plan of Care (Signed)
°  Problem: Coping: °Goal: Level of anxiety will decrease °Outcome: Progressing °  °

## 2022-08-08 NOTE — H&P (Signed)
History and Physical    Patient: Shirley [name] RUE:[id number] DOB: 04/16/1976 DOA: 08/07/2022 DOS: the patient was seen and examined on 08/08/2022 PCP: Bettey Costa, NP  Patient coming from: Home  Chief Complaint:  Chief Complaint  Patient presents with   Shortness of Breath   HPI: Shirley [name] is a 47 y.o. female with medical history significant of HFrEF (45-50%, PAH), CKD 4, CAD s/p PCI, stroke, DM2, OSA, h/o DVT/PE on eliquis.  Pt in to ED last evening with c/o dyspnea, orthopnea, BLE edema, ~20lb wt gain over past 2 days or so.  BP 190-200 systolic on arrival.  Given Lasix 80, BP meds, and NTG drip.  Also lokelma for K of 5.7  Review of Systems: As mentioned in the history of present illness. All other systems reviewed and are negative. Past Medical History:  Diagnosis Date   Abscess of tunica vaginalis    10/09- Abundant S. aureus- sensitive to all abx   Anxiety    Blood dyscrasia    CAD (coronary artery disease) 06/15/2006   s/p Subendocardial MI with PDA angioplasty(no stent) on 06/15/06 and relook  cath 06/19/06 showed patency of site. Cath 12/10- no restenosis or significant CAD progression   CVA (cerebral vascular accident) ~ 02/2014   denies residual on 04/22/2014   CVA (cerebral vascular accident)    history of remote right cerebellar infarct noted on head CT at least since 10/2011   Depression    Diabetes mellitus type 2, uncontrolled, with complications    Fibromyalgia    Gastritis    Gastroparesis    secondary to poorly controlled DM, last emptying study performed 01/2010  was normal but may be falsely positive as pt was on reglan   GERD (gastroesophageal reflux disease)    Hepatitis B, chronic    Hep BeAb+,Hep B cAb+ & Hep BsAg+ (9/06)   History of pyelonephritis    H/o GrpB Pyelonephritis (9/06) and UTI- 07/11- E.Coli, 12/10- GBS   Hyperlipidemia    Hypertension    Iron deficiency anemia    Irregular menses    Small ovarian follicles seen on  CT(9/06)   MI (myocardial infarction) 05/2006   PDA percutaneous transluminal coronary angioplasty   Migraine    "weekly" (04/22/2014)   N&V (nausea and vomiting)    Chronic. Unclear etiology with multiple admission and ED visits. CT abdomen with and without contrast (02/2011)  showed no acute process. Gastic Emptying scan (01/2010) was normal. Ultrasound of the abdomen was within normal limits. Hepatitis B viral load was undectable. HIV NR. EGD - gastritis, Hpylori + s/p Rx   New onset of congestive heart failure 05/16/2022   Obesity    OSA (obstructive sleep apnea)    "suppose to wear mask but I don't" (04/22/2014)   Peripheral neuropathy    Pneumonia    "this is probably the 2nd or 3rd time I've had pneumonia" (04/22/2014)   Recurrent boils    Seasonal asthma    Substance abuse    Thrombocytosis    Hem/Onc suggested 2/2 chronic hepatits and/or iron deficiency anemia   Past Surgical History:  Procedure Laterality Date   CESAREAN SECTION  1997   CORONARY ANGIOPLASTY WITH STENT PLACEMENT  2008   "2 stents"   ESOPHAGOGASTRODUODENOSCOPY N/A 04/23/2014   Procedure: ESOPHAGOGASTRODUODENOSCOPY (EGD);  Surgeon: Vertell Novak., MD;  Location: [facility] ENDOSCOPY;  Service: Endoscopy;  Laterality: N/A;   INCISION AND DRAINAGE ABSCESS Right 04/17/2021   Procedure: INCISION AND DRAINAGE CHEST  WALL ABSCESS;  Surgeon: Diamantina Monks, MD;  Location: [facility] OR;  Service: General;  Laterality: Right;   IR ANGIOGRAM PELVIS SELECTIVE OR SUPRASELECTIVE  12/07/2016   IR ANGIOGRAM PELVIS SELECTIVE OR SUPRASELECTIVE  12/07/2016   IR ANGIOGRAM SELECTIVE EACH ADDITIONAL VESSEL  12/07/2016   IR ANGIOGRAM SELECTIVE EACH ADDITIONAL VESSEL  12/07/2016   IR EMBO ARTERIAL NOT HEMORR HEMANG INC GUIDE ROADMAPPING  12/07/2016   IR RADIOLOGIST EVAL & MGMT  01/09/2017   IR US GUIDE VASC ACCESS RIGHT  12/07/2016   IRRIGATION AND DEBRIDEMENT FOOT Right 04/07/2020   Procedure: Incision and Drainage Right Foot;  Surgeon: Park Liter, DPM;  Location: [facility] ORS;  Service: Podiatry;  Laterality: Right;   Social History:  reports that she quit smoking about 26 years ago. Her smoking use included cigarettes. She has never used smokeless tobacco. She reports that she does not currently use drugs after having used the following drugs: Marijuana and Cocaine. She reports that she does not drink alcohol.  Allergies  Allergen Reactions   Lisinopril Nausea Only and Swelling   Morphine And Related Itching and Swelling    Family History  Problem Relation Age of Onset   Diabetes Father    Healthy Mother     Prior to Admission medications   Medication Sig Start Date End Date Taking? Authorizing Provider  albuterol (VENTOLIN HFA) 108 (90 Base) MCG/ACT inhaler Inhale 2 puffs into the lungs every 4 (four) hours as needed for wheezing or shortness of breath. 11/30/19   Zannie Cove, MD  amLODipine (NORVASC) 10 MG tablet Take 1 tablet (10 mg total) by mouth daily. 04/26/21   Glade Lloyd, MD  apixaban (ELIQUIS) 5 MG TABS tablet Take 1 tablet (5 mg total) by mouth 2 (two) times daily. 05/23/22   Arrien, York Ram, MD  atorvastatin (LIPITOR) 40 MG tablet Take 1 tablet (40 mg total) by mouth daily. 03/25/22 05/31/22  Zigmund Daniel., MD  Blood Glucose Monitoring Suppl (ONETOUCH VERIO FLEX SYSTEM) w/Device KIT Use in the morning, at noon, and at bedtime. 05/23/22   Arrien, York Ram, MD  carvedilol (COREG) 25 MG tablet Take 1 tablet (25 mg total) by mouth 2 (two) times daily with a meal. 03/24/22 05/31/22  Zigmund Daniel., MD  cyclobenzaprine (FLEXERIL) 10 MG tablet Take 10 mg by mouth 3 (three) times daily as needed for muscle spasms.    [provider]  dapagliflozin propanediol (FARXIGA) 10 MG TABS tablet Take 1 tablet (10 mg total) by mouth daily before breakfast. 06/09/22   Robbie Lis M, PA-C  dicyclomine (BENTYL) 10 MG capsule Take 1 capsule (10 mg total) by mouth 4 (four) times daily -  before  meals and at bedtime. 08/20/21   Elson Areas, PA-C  diphenhydrAMINE (BENADRYL) 25 MG tablet Take 25 mg by mouth every 6 (six) hours as needed for allergies.    [provider]  famotidine (PEPCID) 20 MG tablet Take 20 mg by mouth every evening. 04/11/21   [provider]  furosemide (LASIX) 20 MG tablet Take 3 tablets (60 mg total) by mouth daily. 05/24/22   Arrien, York Ram, MD  glucose blood [facility] VERIO) test strip Use as instructed to check blood sugar 3 times daily. Dx codes: E11.8, E11.65 11-Dec-2018   Hoy Register, MD  hydrALAZINE (APRESOLINE) 50 MG tablet Take 1 tablet (50 mg total) by mouth every 8 (eight) hours. 05/31/22   Robbie Lis M, PA-C  Insulin Pen Needle (PEN  NEEDLES 3/16") 31G X 5 MM MISC 1 Device by Does not apply route 4 (four) times daily - after meals and at bedtime. 11/28/18   Grayce Sessions, NP  INSULIN SYRINGE .5CC/28G (INS SYRINGE/NEEDLE .5CC/28G) 28G X 1/2" 0.5 ML MISC Please provide 1 month supply 09/04/17   Scherrie Gerlach, MD  isosorbide mononitrate (IMDUR) 60 MG 24 hr tablet Take 1 tablet (60 mg total) by mouth daily. 05/31/22   Allayne Butcher, PA-C  linaclotide [facility]) 145 MCG CAPS capsule Take 145 mcg by mouth daily as needed (for constipation).    [provider]  meclizine (ANTIVERT) 25 MG tablet Take 1 tablet (25 mg total) by mouth 3 (three) times daily as needed for dizziness. 04/26/21   Glade Lloyd, MD  MOUNJARO 7.5 MG/0.5ML Pen Inject 7.5 mg into the skin every Monday. 04/20/22   [provider]  omeprazole (PRILOSEC) 40 MG capsule Take 40 mg by mouth at bedtime. 03/10/21   [provider]  ondansetron (ZOFRAN-ODT) 4 MG disintegrating tablet Take 4 mg by mouth daily as needed for nausea or vomiting (DISSOLVE ORALLY).    [provider]  OneTouch Delica Lancets 33G MISC Use to check blood sugar 3 times daily. Dx codes: E11.8, E11.65 12/07/2018   Hoy Register, MD   oxyCODONE-acetaminophen (PERCOCET) 10-325 MG tablet Take 1 tablet by mouth 3 (three) times daily as needed for pain.    [provider]  promethazine (PHENERGAN) 25 MG tablet Take 25 mg by mouth every 6 (six) hours as needed for nausea or vomiting.    [provider]  senna (SENOKOT) 8.6 MG TABS tablet Take 1 tablet (8.6 mg total) by mouth 2 (two) times daily. Patient taking differently: Take 1 tablet by mouth 2 (two) times daily as needed for mild constipation. 04/26/21   Glade Lloyd, MD    Physical Exam: Vitals:   08/08/22 0240 08/08/22 0245 08/08/22 0300 08/08/22 0305  BP: (!) 155/90 (!) 152/88 (!) 144/82 135/83  Pulse: 79 85 85 83  Resp:      Temp:      SpO2: 94% 94% 95% 95%   Constitutional: NAD, calm, comfortable Respiratory: clear to auscultation bilaterally, no wheezing, no crackles. Normal respiratory effort. No accessory muscle use.  Cardiovascular: Regular rate and rhythm, no murmurs / rubs / gallops. 3+ BLE pitting edema. 2+ pedal pulses. No carotid bruits.  Abdomen: no tenderness, no masses palpated. No hepatosplenomegaly. Bowel sounds positive.  Neurologic: CN 2-12 grossly intact. Sensation intact, DTR normal. Strength 5/5 in all 4.  Psychiatric: Normal judgment and insight. Alert and oriented x 3. Normal mood.   Data Reviewed:       Latest Ref Rng & Units 08/07/2022    9:29 PM 05/16/2022    8:04 PM 03/24/2022    2:49 AM  CBC  WBC 4.0 - 10.5 K/uL 7.7  10.3  10.2   Hemoglobin 12.0 - 15.0 g/dL 9.4  45.4  09.8   Hematocrit 36.0 - 46.0 % 27.7  32.4  28.6   Platelets 150 - 400 K/uL 412  382  297       Latest Ref Rng & Units 08/07/2022    9:29 PM 05/31/2022    3:28 PM 05/23/2022    1:23 AM  CMP  Glucose 70 - 99 mg/dL 92  119  91   BUN 6 - 20 mg/dL 45  37  48   Creatinine 0.44 - 1.00 mg/dL 1.47  8.29  5.62   Sodium 135 -  145 mmol/L 140  141  137   Potassium 3.5 - 5.1 mmol/L 5.7  3.9  3.9   Chloride 98 - 111 mmol/L 111  108  109   CO2 22 - 32 mmol/L  19  23  22    Calcium 8.9 - 10.3 mg/dL 8.7  8.3  8.1    BNP 1610  Trops 31 -> 32  CXR: IMPRESSION: Cardiomegaly with vascular congestion and small pleural effusions. Findings consistent with CHF.  Assessment and Plan: * Acute on chronic systolic CHF (congestive heart failure) Pt with HTN urgency + reported ~20lb wt gain over past few days. EF 45-50% chronic cor pulmonale BP control as per HTN below CHF pathway Tele monitor Got 80mg  IV lasix in ED Will put on 60mg  IV lasix daily for the moment Daily BMP with diuresis Strict intake and output  Severe hypertension Continue / resume all home BP meds: 1) Got amlodipine, coreg, hydralazine started in ED 2) resume imdur, in AM 3) titrate off NTG GTT as able  CKD (chronic kidney disease) stage 4, GFR 15-29 ml/min Pt with CKD 4 at baseline now. Creat 2.7 today looks to be her baseline 1) Daily BMP to monitor closely with diuresis for CHF. 2) monitor repeat K this AM, hopefully down following lasix + lokelma  CAD S/P percutaneous coronary angioplasty Cont eliquis, BB, statin  Type 2 diabetes mellitus with hyperlipidemia Cont Farxiga Cont mounjaro once med rec completed (assuming she's still on this) CBG today looks good on BMP.  History of pulmonary embolism Cont eliquis  Chronic pain Cont home percocet, verified dosing with PMP aware (looks like pt prescribed 5 tabs a day of percocet 10)      Advance Care Planning:   Code Status: Full Code  Consults: EDP d/w PCCM  Family Communication: No family in room  Severity of Illness: The appropriate patient status for this patient is INPATIENT. Inpatient status is judged to be reasonable and necessary in order to provide the required intensity of service to ensure the patient's safety. [name] presenting symptoms, physical exam findings, and initial radiographic and laboratory data in the context of their chronic comorbidities is felt to place them at high risk for  further clinical deterioration. Furthermore, it is not anticipated that the patient will be medically stable for discharge from the hospital within 2 midnights of admission.   * I certify that at the point of admission it is my clinical judgment that the patient will require inpatient hospital care spanning beyond 2 midnights from the point of admission due to high intensity of service, high risk for further deterioration and high frequency of surveillance required.*  Author: Hillary Bow., DO 08/08/2022 5:01 AM  For on call review [email or web address].

## 2022-08-08 NOTE — ED Notes (Signed)
Attempted report to 4E, Charge Nurse stated she did not feel comfortable with patient coming to Progressive Unit. Dr. Read Drivers notified.

## 2022-08-08 NOTE — ED Notes (Signed)
This nurse assists patient back to bed.

## 2022-08-08 NOTE — Assessment & Plan Note (Signed)
Continue / resume all home BP meds: 1) Got amlodipine, coreg, hydralazine started in ED 2) resume imdur, in AM 3) titrate off NTG GTT as able

## 2022-08-08 NOTE — Assessment & Plan Note (Signed)
>>  ASSESSMENT AND PLAN FOR ACUTE ON CHRONIC SYSTOLIC CHF (CONGESTIVE HEART FAILURE) (HCC) WRITTEN ON 08/08/2022  4:59 AM BY GARDNER, JARED M, DO  Pt with HTN urgency + reported ~20lb wt gain over past few days. EF 45-50% chronic cor pulmonale BP control as per HTN below CHF pathway Tele monitor Got 80mg  IV lasix in ED Will put on 60mg  IV lasix daily for the moment Daily BMP with diuresis Strict intake and output

## 2022-08-08 NOTE — Progress Notes (Signed)
   PCCM transfer request    Sending physician: Dr. Read Drivers  Sending facility: [facility]  Reason for transfer: CHF exacerbation, hypertension/hypertensive emergency  Brief case summary: 47 year old history of systolic heart failure presents with presumed CHF exacerbation and hypertensive emergency.  On nitroglycerin.  [facility] ready to transport to progressive as previously ordered but progressive floor is refusing given she is on maximal dose nitroglycerin.  Notably suspect medication adherence that she has not filled or taking medications that are clearly listed in her recent cardiology visit.  Recommendations made prior to transfer: I recommend resuming her oral medicines.  I resumed all oral antihypertensives as outlined in her most recent cardiology visit, orders placed.  ED provider to administer labetalol.  Recommend down titration of nitroglycerin as expect blood pressure to improve.  Recommend transport to progressive at [facility]  If necessary, PCCM is happy to evaluate patient and make additional recommendations at time of transfer.  Transfer accepted: No recommend prior plans of admission to [facility], recommend basic interventions as above and can reconsult or be contacted as needed in the future    Lesia Sago [facility] 08/08/22 1:36 AM [facility]  For contact information, see Amion. If no response to pager, please call PCCM consult pager. After hours, 7PM- 7AM, please call Elink.

## 2022-08-09 ENCOUNTER — Inpatient Hospital Stay (HOSPITAL_COMMUNITY): Payer: Medicare HMO

## 2022-08-09 DIAGNOSIS — I10 Essential (primary) hypertension: Secondary | ICD-10-CM | POA: Diagnosis not present

## 2022-08-09 DIAGNOSIS — R0789 Other chest pain: Secondary | ICD-10-CM

## 2022-08-09 DIAGNOSIS — I5023 Acute on chronic systolic (congestive) heart failure: Secondary | ICD-10-CM | POA: Diagnosis not present

## 2022-08-09 LAB — GLUCOSE, CAPILLARY
Glucose-Capillary: 127 mg/dL — ABNORMAL HIGH (ref 70–99)
Glucose-Capillary: 167 mg/dL — ABNORMAL HIGH (ref 70–99)
Glucose-Capillary: 145 mg/dL — ABNORMAL HIGH (ref 70–99)
Glucose-Capillary: 165 mg/dL — ABNORMAL HIGH (ref 70–99)

## 2022-08-09 LAB — BASIC METABOLIC PANEL
Anion gap: 8 (ref 5–15)
BUN: 43 mg/dL — ABNORMAL HIGH (ref 6–20)
CO2: 21 mmol/L — ABNORMAL LOW (ref 22–32)
Calcium: 8.1 mg/dL — ABNORMAL LOW (ref 8.9–10.3)
Chloride: 108 mmol/L (ref 98–111)
Creatinine, Ser: 3.41 mg/dL — ABNORMAL HIGH (ref 0.44–1.00)
GFR, Estimated: 16 mL/min — ABNORMAL LOW (ref >60)
Glucose, Bld: 125 mg/dL — ABNORMAL HIGH (ref 70–99)
Potassium: 4 mmol/L (ref 3.5–5.1)
Sodium: 137 mmol/L (ref 135–145)

## 2022-08-09 LAB — CBC
HCT: 23.9 % — ABNORMAL LOW (ref 36.0–46.0)
Hemoglobin: 8.5 g/dL — ABNORMAL LOW (ref 12.0–15.0)
MCH: 25.7 pg — ABNORMAL LOW (ref 26.0–34.0)
MCHC: 35.6 g/dL (ref 30.0–36.0)
MCV: 72.2 fL — ABNORMAL LOW (ref 80.0–100.0)
Platelets: 362 10*3/uL (ref 150–400)
RBC: 3.31 MIL/uL — ABNORMAL LOW (ref 3.87–5.11)
RDW: 17.3 % — ABNORMAL HIGH (ref 11.5–15.5)
WBC: 11 10*3/uL — ABNORMAL HIGH (ref 4.0–10.5)
nRBC: 0 % (ref 0.0–0.2)

## 2022-08-09 LAB — TROPONIN I (HIGH SENSITIVITY)
Troponin I (High Sensitivity): 30 ng/L — ABNORMAL HIGH (ref <18)
Troponin I (High Sensitivity): 28 ng/L — ABNORMAL HIGH (ref <18)

## 2022-08-09 MED ORDER — CHLORHEXIDINE GLUCONATE CLOTH 2 % EX PADS
6.0000 | MEDICATED_PAD | Freq: Every day | CUTANEOUS | Status: AC
  Administered 2022-08-09 – 2022-08-13 (×5): 6 via TOPICAL

## 2022-08-09 MED ORDER — FERROUS SULFATE 325 (65 FE) MG PO TABS
325.0000 mg | ORAL_TABLET | Freq: Every day | ORAL | Status: DC
  Administered 2022-08-10 – 2022-08-15 (×6): 325 mg via ORAL
  Filled 2022-08-09 (×6): qty 1

## 2022-08-09 MED ORDER — MUPIROCIN 2 % EX OINT
1.0000 | TOPICAL_OINTMENT | Freq: Two times a day (BID) | CUTANEOUS | Status: AC
  Administered 2022-08-09 – 2022-08-13 (×10): 1 via NASAL
  Filled 2022-08-09 (×2): qty 22

## 2022-08-09 MED ORDER — NITROGLYCERIN 0.4 MG SL SUBL: 0.4000 mg | SUBLINGUAL_TABLET | SUBLINGUAL | Status: DC | PRN

## 2022-08-09 MED ORDER — NITROGLYCERIN 0.4 MG SL SUBL
SUBLINGUAL_TABLET | SUBLINGUAL | Status: AC
  Administered 2022-08-09: 0.4 mg
  Filled 2022-08-09: qty 1

## 2022-08-09 NOTE — Progress Notes (Signed)
Heart Failure Nurse Navigator Progress Note  PCP: Bettey Costa, NP PCP-Cardiologist: Nahser Admission Diagnosis: Acute congestive heart failure, Hyperkalemia, Dyspnea.  Admitted from: Home  Presentation:   Shirley [name] presented with shortness of breath, swelling to legs + 3,  and abdomen, weight gain of about 20 pounds in 2 days reported. BP 180/105, HR 84, BNP 2,047, Troponin 31, IV lasix given and Nitroglycerin drip started. CXR with cardiomegaly, vascular congestion and small pleural effusions.   Patient was seen prior in HF TOC on 05/31/2022 , was re-educated on the sign and symptoms of heart failure, daily weights, when to call her doctor or go to the ED. Diet/ fluid restrictions, and taking all her medications as prescribed, and attending all medical appointments. Patient verbalized her understanding, a HF TOC appointment was scheduled for 08/28/2022 @ 1:30 pm.   ECHO/ LVEF: 45-50% HFrEF  Clinical Course:  Past Medical History:  Diagnosis Date   Abscess of tunica vaginalis    10/09- Abundant S. aureus- sensitive to all abx   Anxiety    Blood dyscrasia    CAD (coronary artery disease) 06/15/2006   s/p Subendocardial MI with PDA angioplasty(no stent) on 06/15/06 and relook  cath 06/19/06 showed patency of site. Cath 12/10- no restenosis or significant CAD progression   CVA (cerebral vascular accident) ~ 02/2014   denies residual on 04/22/2014   CVA (cerebral vascular accident)    history of remote right cerebellar infarct noted on head CT at least since 10/2011   Depression    Diabetes mellitus type 2, uncontrolled, with complications    Fibromyalgia    Gastritis    Gastroparesis    secondary to poorly controlled DM, last emptying study performed 01/2010  was normal but may be falsely positive as pt was on reglan   GERD (gastroesophageal reflux disease)    Hepatitis B, chronic    Hep BeAb+,Hep B cAb+ & Hep BsAg+ (9/06)   History of pyelonephritis    H/o GrpB Pyelonephritis  (9/06) and UTI- 07/11- E.Coli, 12/10- GBS   Hyperlipidemia    Hypertension    Iron deficiency anemia    Irregular menses    Small ovarian follicles seen on CT(9/06)   MI (myocardial infarction) 05/2006   PDA percutaneous transluminal coronary angioplasty   Migraine    "weekly" (04/22/2014)   N&V (nausea and vomiting)    Chronic. Unclear etiology with multiple admission and ED visits. CT abdomen with and without contrast (02/2011)  showed no acute process. Gastic Emptying scan (01/2010) was normal. Ultrasound of the abdomen was within normal limits. Hepatitis B viral load was undectable. HIV NR. EGD - gastritis, Hpylori + s/p Rx   New onset of congestive heart failure 05/16/2022   Obesity    OSA (obstructive sleep apnea)    "suppose to wear mask but I don't" (04/22/2014)   Peripheral neuropathy    Pneumonia    "this is probably the 2nd or 3rd time I've had pneumonia" (04/22/2014)   Recurrent boils    Seasonal asthma    Substance abuse    Thrombocytosis    Hem/Onc suggested 2/2 chronic hepatits and/or iron deficiency anemia     Social History   Socioeconomic History   Marital status: Single    Spouse name: Not on file   Number of children: 2   Years of education: 11   Highest education level: Not on file  Occupational History   Occupation: other    Comment: unemployed    Comment:  disability  Tobacco Use   Smoking status: Former    Types: Cigarettes    Quit date: 04/24/1996    Years since quitting: 26.3   Smokeless tobacco: Never   Tobacco comments:    quit smoking cigarettes age 50  Vaping Use   Vaping Use: Never used  Substance and Sexual Activity   Alcohol use: No    Alcohol/week: 0.0 standard drinks of alcohol    Comment: 04/22/2014 "might have a few drinks a month"   Drug use: Not Currently    Types: Marijuana, Cocaine    Comment: 04/22/2104 "quit drugs ~ 1-2 yr ago"   Sexual activity: Yes    Birth control/protection: None  Other Topics Concern   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         Social Determinants of Health   Financial Resource Strain: Low Risk  (08/09/2022)   Overall Financial Resource Strain (CARDIA)    Difficulty of Paying Living Expenses: Not hard at all  Food Insecurity: No Food Insecurity (08/08/2022)   Hunger Vital Sign    Worried About Running Out of Food in the Last Year: Never true    Ran Out of Food in the Last Year: Never true  Transportation Needs: No Transportation Needs (08/08/2022)   PRAPARE - Administrator, Civil Service (Medical): No    Lack of Transportation (Non-Medical): No  Physical Activity: Not on file  Stress: Not on file  Social Connections: Not on file   Education Assessment and Provision:  Detailed education and instructions provided on heart failure disease management including the following:  Signs and symptoms of Heart Failure When to call the physician Importance of daily weights Low sodium diet Fluid restriction Medication management Anticipated future follow-up appointments  Patient education given on each of the above topics.  Patient acknowledges understanding via teach back method and acceptance of all instructions.  Education Materials:  "Living Better With Heart Failure" Booklet, HF zone tool, & Daily Weight Tracker Tool.  Patient has scale at home: yes Patient has pill box at home: Yes    High Risk Criteria for Readmission and/or Poor Patient Outcomes: Heart failure hospital admissions (last 6 months): 1  No Show rate: 28%  Difficult social situation: No Demonstrates medication adherence: No, states she ran out of medications, and didn't refill them.  Primary Language: English Literacy level: Reading, writing, and comprehension.   Barriers of Care:   Medication compliance Diet/ fluids/ daily  weights  Considerations/Referrals:   Referral made to Heart Failure Pharmacist Stewardship: Yes Referral made to Heart Failure CSW/NCM TOC: No Referral made to Heart & Vascular TOC clinic: Yes, 08/28/2022 @ 1:30 pm.   Items for Follow-up on DC/TOC: Medication compliance Diet/ fluids/ daily weights compliance Continued HF education Was seen in HF TOC 05/31/22)    Rhae Hammock, BSN, RN Heart Failure Teacher, adult education Only

## 2022-08-09 NOTE — Progress Notes (Signed)
Rounding Note    Patient Name: Shirley [name] Date of Encounter: 08/09/2022  [facility] Cardiologist: Kristeen Miss, MD    Subjective    47 yo with hx of CAD ( PCI years ago with Dr. Jacinto Halim) , CVA, PE/DVT, untreated OSA , CKD  Admitted with increasing dyspnea. We were asked to see her for worsening dyspnea   She has diuresed 1.6 liters so far this admission   Renal artery duplex shows no evidence of renal artery stenosis (prelim interpretation )   Having significant DOE , chest pressure after getting up to use the bedside commode   Gave her a SL NTG  Has significant wheezing   Will check troponin levels  I discussed with her and her husband the issues of heart cath in the setting of CKD with a creatinine of 3.  She understands the complexity of the situation      Inpatient Medications    Scheduled Meds:  amLODipine  10 mg Oral Daily   apixaban  5 mg Oral BID   atorvastatin  40 mg Oral Daily   carvedilol  25 mg Oral BID WC   Chlorhexidine Gluconate Cloth  6 each Topical Q0600   dapagliflozin propanediol  10 mg Oral QAC breakfast   famotidine  20 mg Oral QPM   furosemide  60 mg Intravenous BID   hydrALAZINE  100 mg Oral Q8H   insulin aspart  0-6 Units Subcutaneous TID WC   isosorbide mononitrate  60 mg Oral Daily   mupirocin ointment  1 Application Nasal BID   ondansetron (ZOFRAN) IV  4 mg Intravenous Once   pantoprazole  40 mg Oral Daily   sodium chloride flush  3 mL Intravenous Q12H   Continuous Infusions:  sodium chloride     nitroGLYCERIN 105 mcg/min (08/09/22 0541)   PRN Meds: sodium chloride, acetaminophen, linaclotide, meclizine, oxyCODONE-acetaminophen **AND** oxyCODONE, senna, sodium chloride flush   Vital Signs    Vitals:   08/09/22 0400 08/09/22 0441 08/09/22 0800 08/09/22 0900  BP: (!) 143/75  (!) 140/77 (!) 139/51  Pulse: (!) 101     Resp: 18     Temp: 98 F (36.7 C)     TempSrc: Oral     SpO2: 96%  94% 96%  Weight:   101.4 kg      Intake/Output Summary (Last 24 hours) at 08/09/2022 1610 Last data filed at 08/09/2022 0600 Gross per 24 hour  Intake 1027.57 ml  Output 2800 ml  Net -1772.43 ml      08/09/2022    4:41 AM 08/08/2022    5:00 AM 05/31/2022    2:35 PM  Last 3 Weights  Weight (lbs) 223 lb 8.7 oz 229 lb 8 oz 209 lb  Weight (kg) 101.4 kg 104.1 kg 94.802 kg      Telemetry    Sinus tach  - Personally Reviewed  ECG     - Personally Reviewed  Physical Exam    GEN: middle age female, in moderate respiratory distress  Neck: No JVD Cardiac: RRR, no murmurs, rubs, or gallops.  Respiratory: wheezing  GI: Soft, nontender, non-distended  MS: No edema; No deformity. Neuro:  Nonfocal  Psych: Normal affect   Labs    High Sensitivity Troponin:   Recent Labs  Lab 08/07/22 2129 08/07/22 2315  TROPONINIHS 31* 32*     Chemistry Recent Labs  Lab 08/07/22 2129 08/08/22 0550 08/09/22 0550  NA 140 141 137  K 5.7* 3.7 4.0  CL 111 109 108  CO2 19* 19* 21*  GLUCOSE 92 84 125*  BUN 45* 43* 43*  CREATININE 2.78* 2.86* 3.41*  CALCIUM 8.7* 8.3* 8.1*  GFRNONAA 20* 20* 16*  ANIONGAP Lipids No results for input(s): "CHOL", "TRIG", "HDL", "LABVLDL", "LDLCALC", "CHOLHDL" in the last 168 hours.  Hematology Recent Labs  Lab 08/07/22 2129 08/09/22 0550  WBC 7.7 11.0*  RBC 3.75* 3.31*  HGB 9.4* 8.5*  HCT 27.7* 23.9*  MCV 73.9* 72.2*  MCH 25.1* 25.7*  MCHC 33.9 35.6  RDW 17.7* 17.3*  PLT 412* 362   Thyroid No results for input(s): "TSH", "FREET4" in the last 168 hours.  BNP Recent Labs  Lab 08/07/22 2129  BNP 2,047.2*    DDimer No results for input(s): "DDIMER" in the last 168 hours.   Radiology    VAS US RENAL ARTERY DUPLEX  Result Date: 08/09/2022 ABDOMINAL VISCERAL Patient Name:  Shirley [name] Heber  Date of Exam:   08/09/2022 Medical Rec #: [id number]         Accession #:    [id number] Date of Birth: 1975-07-31         Patient Gender: F Patient Age:   40 years Exam  Location:  [facility] Procedure:      VAS US RENAL ARTERY DUPLEX Referring Phys: [id number] LINDSAY B ROBERTS -------------------------------------------------------------------------------- Indications: Hypertension High Risk Factors: Hypertension, hyperlipidemia, Diabetes, past history of                    smoking, coronary artery disease, prior CVA. Other Factors: CHF, CKD4. Limitations: Obesity, air/bowel gas and respiratory variation, positioning. Comparison Study: No previous exams Performing Technologist: Jody Hill RVT, RDMS  Examination Guidelines: A complete evaluation includes B-mode imaging, spectral Doppler, color Doppler, and power Doppler as needed of all accessible portions of each vessel. Bilateral testing is considered an integral part of a complete examination. Limited examinations for reoccurring indications may be performed as noted.  Duplex Findings: +--------------------+--------+--------+------+--------+ Mesenteric          PSV cm/sEDV cm/sPlaqueComments +--------------------+--------+--------+------+--------+ Aorta Prox            108      0                   +--------------------+--------+--------+------+--------+ Celiac Artery Origin  117                          +--------------------+--------+--------+------+--------+ SMA Origin            220      20                  +--------------------+--------+--------+------+--------+    +------------------+--------+--------+-------+ Right Renal ArteryPSV cm/sEDV cm/sComment +------------------+--------+--------+-------+ Origin               82      13           +------------------+--------+--------+-------+ Proximal             72      12           +------------------+--------+--------+-------+ Mid                  54      14           +------------------+--------+--------+-------+ Distal               53      12           +------------------+--------+--------+-------+  +-----------------+--------+--------+-------+  Left Renal ArteryPSV cm/sEDV cm/sComment +-----------------+--------+--------+-------+ Origin              76      12           +-----------------+--------+--------+-------+ Proximal            62      9            +-----------------+--------+--------+-------+ Mid                 56      10           +-----------------+--------+--------+-------+ Distal              49      8            +-----------------+--------+--------+-------+ +------------+--------+--------+----+-----------+--------+--------+----+ Right KidneyPSV cm/sEDV cm/sRI  Left KidneyPSV cm/sEDV cm/sRI   +------------+--------+--------+----+-----------+--------+--------+----+ Upper Pole                      Upper Pole                      +------------+--------+--------+----+-----------+--------+--------+----+ Mid         22      4       0.        13      0       0.98 +------------+--------+--------+----+-----------+--------+--------+----+ Lower Pole                      Lower Pole 11      0       1.00 +------------+--------+--------+----+-----------+--------+--------+----+ Hilar       33      6       0.82Hilar      28      5       0.83 +------------+--------+--------+----+-----------+--------+--------+----+ +------------------+-----+------------------+----------------+ Right Kidney           Left Kidney                        +------------------+-----+------------------+----------------+ RAR                    RAR                                +------------------+-----+------------------+----------------+ RAR (manual)      0.76 RAR (manual)      0.70             +------------------+-----+------------------+----------------+ Cortex                 Cortex            9.0 / 2.2 (0.75) +------------------+-----+------------------+----------------+ Cortex thickness       Corex thickness                     +------------------+-----+------------------+----------------+ Kidney length (cm)10.10Kidney length (cm)10.40            +------------------+-----+------------------+----------------+   Summary: Renal:  Right: No evidence of right renal artery stenosis. Abnormal right        Resistive Index. Normal size right kidney. RRV flow present. Left:  No evidence of left renal artery stenosis. Abnormal left        Resisitve Index. Normal size of left kidney. LRV flow        present. Mesenteric: Areas of limited visceral study include right  parenchymal flow.  *See table(s) above for measurements and observations.     Preliminary    DG Chest 2 View  Result Date: 08/07/2022 CLINICAL DATA:  Shortness of breath and chest pain. EXAM: CHEST - 2 VIEW COMPARISON:  05/18/2022 FINDINGS: Cardiomegaly is similar. Small pleural effusions with fluid in the fissures. There is vascular congestion. No convincing pulmonary edema. No confluent consolidation. No pneumothorax. IMPRESSION: Cardiomegaly with vascular congestion and small pleural effusions. Findings consistent with CHF. Electronically Signed   By: Narda Rutherford M.D.   On: 08/07/2022 22:04    Cardiac Studies      Patient Profile     47 y.o. female    Assessment & Plan     1.  Acute on chronic combined CHF:  echo from Nov. 2023 shows mildly reduced LVEF of 45-50%.   Unable to determine her diastolic function ( although its almost certain that she has diastolic dysfunction  Cont diuresis   2.  Chest pressure :   we gave her a SL NTG.   The chest tighness resolved slowly as she caught her breath.  She did not think the SL NTG necessarily helped in the improvement .  She has a hx of MI in the remote past.  The pressure today  was not similar to her previous angina pain   3.  CKD :  :  creatiinine is 3.4 today . IM managing   4.  HTN:  BP continues to improve   5.  Hx of pulmonary embolus:  cont eliquis 5 BID       For questions or updates, please  contact [facility] Please consult [email or web address] for contact info under        Signed, Kristeen Miss, MD  08/09/2022, 9:52 AM

## 2022-08-09 NOTE — Progress Notes (Signed)
   Heart Failure Stewardship Pharmacist Progress Note   PCP: Bettey Costa, NP PCP-Cardiologist: Kristeen Miss, MD    HPI:  47 yo F with PMH of PE/DVT, CAD, T2DM, HLD, CKD, obesity, polysubstance use, OSA, diabetic gastroparesis, and DKA.   Admitted from 03/20/22 to 03/24/22 with acute PE and DVT now taking Eliquis. ECHO at that time showed LVEF 45-50%, mild concentric LVH, RV normal.   Admitted from 05/16/22 to 05/23/22 with acute on chronic CHF exacerbation. Diuresed 5 lbs and discharged with lasix 60 mg daily. Had HF TOC follow up on 2/7. BP was elevated 180/102 and + leg edema. Hydralazine and imdur were increased.   Presented back to the ED on 4/15 with shortness of breath, weight gain, orthopnea, and LE edema over the last 2 days. Hypertensive 190-200s on arrival and started on IV nitroglycerin. CXR with cardiomegaly, vascular congestion, and small pleural effusions. Renal artery duplex shows no evidence of renal artery stenosis.  Current HF Medications: Diuretic: furosemide 60 mg IV BID Beta Blocker: carvedilol 25 mg BID SGLT2i: Farxiga 10 mg daily Other: hydralazine 100 mg TID + Imdur 60 mg daily *also on amlodipine 10 mg daily and nitroglycerin drip  Prior to admission HF Medications: Diuretic: furosemide 20 mg TID Beta blocker: metoprolol XL 100 mg daily SGLT2i: Farxiga 10 mg daily Other: hydralazine 50 mg TID + Imdur 60 mg daily *not taking amlodipine or carvedilol  Pertinent Lab Values: Serum creatinine 3.41, BUN 43, Potassium 4.0, Sodium 137, BNP 2047.2  Vital Signs: Weight: 223 lbs (admission weight: 229 lbs) Blood pressure: 140/70s  Heart rate: 90-100s  I/O: net -2L  Medication Assistance / Insurance Benefits Check: Does the patient have prescription insurance?  Yes Type of insurance plan: [place] Medicaid  Outpatient Pharmacy:  Prior to admission outpatient pharmacy: [facility] Is the patient willing to use [facility] TOC pharmacy at discharge? Yes Is the patient  willing to transition their outpatient pharmacy to utilize a [facility] outpatient pharmacy?   Pending    Assessment: 1. Acute on chronic systolic and diastolic CHF (LVEF 45-50%). NYHA class III symptoms. - Continue furosemide 60 mg IV BID. Strict I/Os and daily weights. Keep K>4 and Mg>2. - Continue carvedilol 25 mg BID - Continue Farxiga 10 mg daily  - Continue hydralazine 100 mg TID - Continue Imdur 60 mg daily, can increase if needed as nitro drip weans off - Continue amlodipine 10 mg daily  Plan: 1) Medication changes recommended at this time: - Agree with changes  2) Patient assistance: - None pending, has [place] Medicaid  3)  Education  - To be completed prior to discharge  Sharen Hones, PharmD, BCPS Heart Failure Engineer, building services Phone [phone]

## 2022-08-09 NOTE — TOC Initial Note (Signed)
Transition of Care (TOC) - Initial/Assessment Note  Donn Pierini RN, BSN Transitions of Care Unit 4E- RN Case Manager See Treatment Team for direct phone #   Patient Details  Name: Shirley [name] MRN: [id number] Date of Birth: Oct 03, 1975  Transition of Care [facility]) CM/SW Contact:    Darrold Span, RN Phone Number: 08/09/2022, 3:05 PM  Clinical Narrative:                 CM received request from bedside RN that pt asking to speak with TOC regarding PCS needs.  CM spoke with pt at bedside- per pt she had PCS in the past about 28yrs ago but let services drop, she would like to see if she can get services approved again, pt states she understands the process. CM provided pt with current application and instructions of process from [employer]- to f/u for application on PCS with Primary care doctor. Per pt she has PCP- Dr. Allena Katz.   Pt also asked about getting home 02, noted pt currently on RA, explained to pt that under insurance would need to qualify under guidelines in order for home 02 to be covered.   Pt voiced she does not use any assist devices, has scale at home that she uses regularly.   TOC to continue to follow for transition of care needs.  Expected Discharge Plan: Home/Self Care Barriers to Discharge: Continued Medical Work up   Patient Goals and CMS Choice Patient states their goals for this hospitalization and ongoing recovery are:: return home CMS Medicare.gov Compare Post Acute Care list provided to:: Patient Choice offered to / list presented to : NA      Expected Discharge Plan and Services   Discharge Planning Services: CM Consult Post Acute Care Choice: NA Living arrangements for the past 2 months: Single Family Home                 DME Arranged: N/A DME Agency: NA       HH Arranged: NA HH Agency: NA        Prior Living Arrangements/Services Living arrangements for the past 2 months: Single Family Home Lives with:: Self Patient language and  need for interpreter reviewed:: Yes Do you feel safe going back to the place where you live?: Yes      Need for Family Participation in Patient Care: Yes (Comment) Care giver support system in place?: Yes (comment) Current home services: DME Criminal Activity/Legal Involvement Pertinent to Current Situation/Hospitalization: No - Comment as needed  Activities of Daily Living Home Assistive Devices/Equipment: None ADL Screening (condition at time of admission) Patient's cognitive ability adequate to safely complete daily activities?: Yes Is the patient deaf or have difficulty hearing?: No Does the patient have difficulty seeing, even when wearing glasses/contacts?: No Does the patient have difficulty concentrating, remembering, or making decisions?: No Patient able to express need for assistance with ADLs?: Yes Does the patient have difficulty dressing or bathing?: No Independently performs ADLs?: Yes (appropriate for developmental age) Does the patient have difficulty walking or climbing stairs?: Yes Weakness of Legs: Both Weakness of Arms/Hands: None  Permission Sought/Granted                  Emotional Assessment Appearance:: Appears stated age Attitude/Demeanor/Rapport: Engaged Affect (typically observed): Accepting, Appropriate Orientation: : Oriented to Self, Oriented to Place, Oriented to  Time, Oriented to Situation Alcohol / Substance Use: Not Applicable Psych Involvement: No (comment)  Admission diagnosis:  Hyperkalemia [E87.5] CHF exacerbation [I50.9]  Dyspnea, unspecified type [R06.00] Acute congestive heart failure, unspecified heart failure type [I50.9] Patient Active Problem List   Diagnosis Date Noted   CKD (chronic kidney disease) stage 4, GFR 15-29 ml/min 08/08/2022   Heart failure 05/19/2022   Acute on chronic systolic CHF (congestive heart failure) 05/17/2022   New onset of congestive heart failure 05/16/2022   Chest pain 03/21/2022   CAD S/P  percutaneous coronary angioplasty 03/21/2022   Acute-on-chronic kidney injury 03/21/2022   Acute pulmonary embolism 03/21/2022   Sepsis 04/17/2021   Conjunctivitis 04/17/2021   Abscess 04/16/2021   Abscess of bursa of right foot    Diabetic foot infection 04/06/2020   Cellulitis of foot, right    Puncture wound of right foot    Sepsis due to pneumonia 11/24/2019   Thrombocytosis 11/24/2019   Chronic pain 11/24/2019   History of pulmonary embolism 10/10/2017   Chronic anticoagulation 10/10/2017   GERD (gastroesophageal reflux disease) 10/10/2017   Leukocytosis 10/10/2017   Prolonged QT interval 10/10/2017   Nausea & vomiting 09/20/2017   Hypertensive urgency 09/20/2017   Intractable nausea and vomiting 08/08/2017   Chronic maxillary sinusitis 01/02/2017   Acute blood loss anemia 11/13/2016   Menorrhagia 11/13/2016   Bilateral pulmonary embolism 10/30/2016   Acute DVT (deep venous thrombosis) 10/30/2016   Nausea and vomiting 09/23/2016   Type 2 diabetes mellitus with hyperglycemia 04/07/2016   Diabetic gastroparesis 04/06/2016   Dehydration 03/27/2015   Gastroparesis 11/11/2014   Hematemesis 10/13/2014   DKA (diabetic ketoacidoses) 10/13/2014   Abdominal pain, chronic, left lower quadrant    Diabetic gastroparesis associated with type 2 diabetes mellitus (HCC) 04/28/2014   History of Helicobacter pylori infection 04/22/2014   Vaginal discharge 02/18/2014   Unspecified constipation 07/21/2013   Tinea corporis 07/21/2013   Intractable vomiting 04/29/2013   Dysmenorrhea 04/22/2013   UTI (urinary tract infection) 07/15/2012   Headache(784.0) 02/08/2012   Health care maintenance 01/22/2012   Chronic hepatitis B (HCC) 03/07/2011   History of leukocytosis 04/06/2010   THROMBOCYTOSIS 04/06/2010   Polysubstance abuse (HCC) 02/23/2010   Iron deficiency anemia 11/22/2009   PERIPHERAL NEUROPATHY 10/01/2009   Hyperlipidemia 08/30/2009   Diabetic polyneuropathy (HCC) 08/30/2009    Hidradenitis (recurrent boils) 07/07/2008   Depression 12/27/2007   Abdominal pain, left lower quadrant 11/21/2007   FIBROMYALGIA 10/30/2007   BACK PAIN 04/01/2007   OBSTRUCTIVE SLEEP APNEA 01/17/2007   ANXIETY DEPRESSION 06/27/2006   Chronic ischemic heart disease 06/15/2006   OBESITY, MORBID 05/15/2006   MIGRAINE HEADACHE 05/15/2006   Asthma 05/15/2006   Severe hypertension 01/16/2006   IRREGULAR MENSTRUATION 01/16/2006   PEDAL EDEMA 01/16/2006   Type 2 diabetes mellitus with hyperlipidemia 01/16/1989   PCP:  Bettey Costa, NP Pharmacy:   [facility] [phone] [address] [address], [place] - [address] AT [address]  [place] [address] Phone: [phone] Fax: [phone]  [facility] [address] [place] [address] Phone: [phone] Fax: [phone]     Social Determinants of Health (SDOH) Social History: SDOH Screenings   Food Insecurity: No Food Insecurity (08/08/2022)  Housing: Low Risk  (08/08/2022)  Transportation Needs: No Transportation Needs (08/08/2022)  Utilities: Not At Risk (08/08/2022)  Alcohol Screen: Low Risk  (08/09/2022)  Depression (PHQ2-9): Medium Risk (11/28/2018)  Financial Resource Strain: Low Risk  (08/09/2022)  Tobacco Use: Medium Risk (08/07/2022)   SDOH Interventions: Alcohol Usage Interventions: Intervention Not Indicated (Score <7) Financial Strain Interventions: Intervention Not Indicated   Readmission Risk Interventions  05/23/2022   11:50 AM 03/24/2022    3:38 PM  Readmission Risk Prevention Plan  Transportation Screening Complete Complete  Medication Review (RN Care Manager) Complete Referral to Pharmacy  PCP or Specialist appointment within 3-5 days of discharge Complete   HRI or Home Care Consult Complete Complete  SW Recovery Care/Counseling Consult  Complete  Palliative Care Screening Not Applicable Not Applicable  Skilled Nursing Facility Not Applicable Not Applicable

## 2022-08-09 NOTE — Progress Notes (Signed)
Renal artery duplex has been completed.   Results can be found under chart review under CV PROC. 08/09/2022 9:32 AM Alissandra Geoffroy RVT, RDMS

## 2022-08-09 NOTE — Progress Notes (Signed)
Pt C/O chest pain and SOB  after using BSC  verbal order from Dr. Elease Hashimoto  to give one  0.4 nitroglycerin tablet. Pts BP 144/78 repeat BP 143/73 with slight relief Dr. Elease Hashimoto at bedside troponins ordered pt given PRN pain medication

## 2022-08-09 NOTE — Progress Notes (Signed)
PROGRESS NOTE Shirley [name]  ZOX:[id number] DOB: [birth year] DOA: 08/07/2022 PCP: Bettey Costa, NP  Brief Narrative/Hospital Course: 47 y.o.f w/ Chronic HFrEF (45-50%, PAH), CKD 4, CAD s/p PCI, stroke, DM2, OSA, h/o DVT/PE on eliquis presented to the ED with complaint of dyspnea orthopnea and bilateral lower leg swelling and about 20 pound weight gain over past 2 days In E C: BP 190-200 systolic on arrival.  Labs with BNP elevated 2047, troponin 31> 32 flat, hyperkalemia 5.7, acidosis bicarb 19, creatinine 2.7 BUN 45 hemoglobin 9.4 platelet 412 pregnancy test negative. Chest x-ray cardiomegaly and vascular congestion with small pleural effusion consistent with CHF. Patient was Given Lasix 80, BP meds, Lokelma and NTG drip and admitted for acute on chronic systolic CHF and severe hypertension.    Subjective: Patient seen and examined this morning Complains of dyspnea on exertion also some chest pain while using bedside commode Overnight BP stable, creatinine uptrending  Assessment and Plan: Principal Problem:   Acute on chronic systolic CHF (congestive heart failure) Active Problems:   Severe hypertension   CKD (chronic kidney disease) stage 4, GFR 15-29 ml/min   CAD S/P percutaneous coronary angioplasty   Type 2 diabetes mellitus with hyperlipidemia   History of pulmonary embolism   Chronic pain   Acute on chronic systolic CHF Severe hypertension Chronic cor pulmonale: Admitted on nitroglycerin drip, IV Lasix> seen by cardiology Lasix dose increased 4/16 to 60 mg bid> still has leg edema, symptomatic.continue to monitor intake output net balance see below.  Last EF 45-50%-unable to determine diastolic dysfunction although olmesartan she has diastolic dysfunction.  Follow-up renal artery duplex. Continue fluid restriction.  Appreciate cardiology input.  Continue antihypertensive regimen amlodipine Coreg, with increased dose of hydralazine and Imdur.  GDMT per cardiology.  Having net  negative balance and weight is improving Net IO Since Admission: -1,600.14 mL [08/09/22 0924]  Filed Weights   08/08/22 0500 08/09/22 0441  Weight: 104.1 kg 101.4 kg   Chest pain this morning: Appreciate cardiology input follow-up troponin likely in the setting of #1   AKI on CKD stage IV: baseline of 2.7 recently has been up to 3.3 back in January, creatinine uptrending, in the setting of diuresis, continue plan as per cardiology, monitor intake output renal function and avoid nephrotoxic medication hypotension.  If creatinine continues to worsen may need nephrology consultation Recent Labs    05/18/22 0111 05/19/22 0134 05/20/22 0031 05/21/22 8119 05/22/22 0043 05/23/22 0123 05/31/22 1528 08/07/22 2129 08/08/22 0550 08/09/22 0550  BUN 27* 30* 36* 46* 45* 48* 37* 45* 43* 43*  CREATININE 2.37* 2.79* 3.13* 3.27* 3.41* 3.39* 2.64* 2.78* 2.86* 3.41*    Hyperkalemia resolved with Lokelma  CAD with history of percutaneous coronary angioplasty Mildly elevated troponin: Troponins are flat likely demand ischemia from hypertension.Continue Eliquis beta-blocker and statin.   T2DM on Farxiga: Currently well-controlled on SSI , farxiga, continue same Recent Labs  Lab 08/08/22 1140 08/08/22 1536 08/08/22 2112 08/09/22 0603  GLUCAP 147* 170* 164* 127*    History of PE on Eliquis, continue  Chronic pain on Percocet History of CVA Class I Obesity with untreated JYN:WGNFAOZ'H Body mass index is 35.01 kg/m. : Will benefit with PCP follow-up, weight loss  healthy lifestyle and outpatient sleep evaluation.   DVT prophylaxis: eliquis Code Status:   Code Status: Full Code Family Communication: plan of care discussed with patient at bedside. Patient status is: Inpatient because of CHF AKI Level of care: Progressive   Dispo: The patient is from:  home            Anticipated disposition: TBD ~ 2 days Objective: Vitals last 24 hrs: Vitals:   08/09/22 0400 08/09/22 0441 08/09/22 0800  08/09/22 0900  BP: (!) 143/75  (!) 140/77 (!) 139/51  Pulse: (!) 101     Resp: 18     Temp: 98 F (36.7 C)     TempSrc: Oral     SpO2: 96%  94% 96%  Weight:  101.4 kg     Weight change: -2.7 kg  Physical Examination: General exam: alert awake oriented X.3, older than stated age HEENT:Oral mucosa moist, Ear/Nose WNL grossly Respiratory system: bilaterally diminished BS Cardiovascular system: S1 & S2 +, No JVD. Gastrointestinal system: Abdomen soft,NT,ND, BS+ Nervous System:Alert, awake, moving extremities. Extremities: LE edema ++,distal peripheral pulses palpable.  Skin: No rashes,no icterus. MSK: Normal muscle bulk,tone, power  Medications reviewed:  Scheduled Meds:  amLODipine  10 mg Oral Daily   apixaban  5 mg Oral BID   atorvastatin  40 mg Oral Daily   carvedilol  25 mg Oral BID WC   Chlorhexidine Gluconate Cloth  6 each Topical Q0600   dapagliflozin propanediol  10 mg Oral QAC breakfast   famotidine  20 mg Oral QPM   furosemide  60 mg Intravenous BID   hydrALAZINE  100 mg Oral Q8H   insulin aspart  0-6 Units Subcutaneous TID WC   isosorbide mononitrate  60 mg Oral Daily   mupirocin ointment  1 Application Nasal BID   ondansetron (ZOFRAN) IV  4 mg Intravenous Once   pantoprazole  40 mg Oral Daily   sodium chloride flush  3 mL Intravenous Q12H   Continuous Infusions:  sodium chloride     nitroGLYCERIN 105 mcg/min (08/09/22 0541)    Diet Order             Diet NPO time specified  Diet effective midnight                  Intake/Output Summary (Last 24 hours) at 08/09/2022 0921 Last data filed at 08/09/2022 0600 Gross per 24 hour  Intake 1263.57 ml  Output 2900 ml  Net -1636.43 ml   Net IO Since Admission: -1,600.14 mL [08/09/22 0921]  Wt Readings from Last 3 Encounters:  08/09/22 101.4 kg  05/31/22 94.8 kg  05/23/22 99 kg     Unresulted Labs (From admission, onward)     Start     Ordered   08/09/22 0500  CBC  Daily,   R      08/08/22 0819    08/08/22 0507  Basic metabolic panel  Daily,   R     Comments: As Scheduled for 5 days    08/08/22 0506          Data Reviewed: I have personally reviewed following labs and imaging studies CBC: Recent Labs  Lab 08/07/22 2129 08/09/22 0550  WBC 7.7 11.0*  HGB 9.4* 8.5*  HCT 27.7* 23.9*  MCV 73.9* 72.2*  PLT 412* 362   Basic Metabolic Panel: Recent Labs  Lab 08/07/22 2129 08/08/22 0550 08/09/22 0550  NA 140 141 137  K 5.7* 3.7 4.0  CL 111 109 108  CO2 19* 19* 21*  GLUCOSE 92 84 125*  BUN 45* 43* 43*  CREATININE 2.78* 2.86* 3.41*  CALCIUM 8.7* 8.3* 8.1*   Recent Labs  Lab 08/08/22 1140 08/08/22 1536 08/08/22 2112 08/09/22 0603  GLUCAP 147* 170* 164* 127*   Recent Results (from  the past 240 hour(s))  MRSA Next Gen by PCR, Nasal     Status: Abnormal   Collection Time: 08/08/22  7:24 PM   Specimen: Nasal Mucosa; Nasal Swab  Result Value Ref Range Status   MRSA by PCR Next Gen DETECTED (A) NOT DETECTED Final    Comment: RESULT CALLED TO, READ BACK BY AND VERIFIED WITH: L STEVENSON,RN@2152  08/07/20 MK (NOTE) The GeneXpert MRSA Assay (FDA approved for NASAL specimens only), is one component of a comprehensive MRSA colonization surveillance program. It is not intended to diagnose MRSA infection nor to guide or monitor treatment for MRSA infections. Test performance is not FDA approved in patients less than 60 years old. Performed at [facility], [address] [place], [place] [address]     Antimicrobials: Anti-infectives (From admission, onward)    None      Culture/Microbiology None  Radiology Studies: DG Chest 2 View  Result Date: 08/07/2022 CLINICAL DATA:  Shortness of breath and chest pain. EXAM: CHEST - 2 VIEW COMPARISON:  05/18/2022 FINDINGS: Cardiomegaly is similar. Small pleural effusions with fluid in the fissures. There is vascular congestion. No convincing pulmonary edema. No confluent consolidation. No pneumothorax. IMPRESSION:  Cardiomegaly with vascular congestion and small pleural effusions. Findings consistent with CHF. Electronically Signed   By: Narda Rutherford M.D.   On: 08/07/2022 22:04     LOS: 1 day   Lanae Boast, MD [facility]  08/09/2022, 9:21 AM

## 2022-08-10 DIAGNOSIS — I5043 Acute on chronic combined systolic (congestive) and diastolic (congestive) heart failure: Secondary | ICD-10-CM | POA: Diagnosis not present

## 2022-08-10 DIAGNOSIS — I5023 Acute on chronic systolic (congestive) heart failure: Secondary | ICD-10-CM | POA: Diagnosis not present

## 2022-08-10 LAB — CBC
HCT: 25 % — ABNORMAL LOW (ref 36.0–46.0)
Hemoglobin: 8.3 g/dL — ABNORMAL LOW (ref 12.0–15.0)
MCH: 24.9 pg — ABNORMAL LOW (ref 26.0–34.0)
MCHC: 33.2 g/dL (ref 30.0–36.0)
MCV: 74.9 fL — ABNORMAL LOW (ref 80.0–100.0)
Platelets: 383 10*3/uL (ref 150–400)
RBC: 3.34 MIL/uL — ABNORMAL LOW (ref 3.87–5.11)
RDW: 17.4 % — ABNORMAL HIGH (ref 11.5–15.5)
WBC: 12.7 10*3/uL — ABNORMAL HIGH (ref 4.0–10.5)
nRBC: 0 % (ref 0.0–0.2)

## 2022-08-10 LAB — RAPID URINE DRUG SCREEN, HOSP PERFORMED
Amphetamines: NOT DETECTED
Barbiturates: NOT DETECTED
Benzodiazepines: NOT DETECTED
Cocaine: NOT DETECTED
Opiates: NOT DETECTED
Tetrahydrocannabinol: NOT DETECTED

## 2022-08-10 LAB — BASIC METABOLIC PANEL
Anion gap: 8 (ref 5–15)
BUN: 42 mg/dL — ABNORMAL HIGH (ref 6–20)
CO2: 22 mmol/L (ref 22–32)
Calcium: 8.3 mg/dL — ABNORMAL LOW (ref 8.9–10.3)
Chloride: 107 mmol/L (ref 98–111)
Creatinine, Ser: 3.5 mg/dL — ABNORMAL HIGH (ref 0.44–1.00)
GFR, Estimated: 15 mL/min — ABNORMAL LOW (ref >60)
Glucose, Bld: 142 mg/dL — ABNORMAL HIGH (ref 70–99)
Potassium: 3.9 mmol/L (ref 3.5–5.1)
Sodium: 137 mmol/L (ref 135–145)

## 2022-08-10 LAB — GLUCOSE, CAPILLARY
Glucose-Capillary: 143 mg/dL — ABNORMAL HIGH (ref 70–99)
Glucose-Capillary: 145 mg/dL — ABNORMAL HIGH (ref 70–99)
Glucose-Capillary: 177 mg/dL — ABNORMAL HIGH (ref 70–99)
Glucose-Capillary: 159 mg/dL — ABNORMAL HIGH (ref 70–99)

## 2022-08-10 LAB — TROPONIN I (HIGH SENSITIVITY): Troponin I (High Sensitivity): 31 ng/L — ABNORMAL HIGH (ref <18)

## 2022-08-10 LAB — TSH: TSH: 1.989 u[IU]/mL (ref 0.350–4.500)

## 2022-08-10 NOTE — Progress Notes (Addendum)
Progress Note  Patient Name: Shirley [name] Date of Encounter: 08/10/2022  Primary Cardiologist: Kristeen Miss, MD  Subjective   Per notes, had CP/SOB after using BSC commode yesterday, had improvement with SL NTG. However she reports she's had fairly persistent chest tightness since yesterday that hasn't really gone away. Feels "so so." She wonders if she's getting a UTI. Dyspnea and edema feel about the same.  Inpatient Medications    Scheduled Meds:  amLODipine  10 mg Oral Daily   apixaban  5 mg Oral BID   atorvastatin  40 mg Oral Daily   carvedilol  25 mg Oral BID WC   Chlorhexidine Gluconate Cloth  6 each Topical Q0600   dapagliflozin propanediol  10 mg Oral QAC breakfast   famotidine  20 mg Oral QPM   ferrous sulfate  325 mg Oral Q breakfast   furosemide  60 mg Intravenous BID   hydrALAZINE  100 mg Oral Q8H   insulin aspart  0-6 Units Subcutaneous TID WC   isosorbide mononitrate  60 mg Oral Daily   mupirocin ointment  1 Application Nasal BID   ondansetron (ZOFRAN) IV  4 mg Intravenous Once   pantoprazole  40 mg Oral Daily   sodium chloride flush  3 mL Intravenous Q12H   Continuous Infusions:  sodium chloride     nitroGLYCERIN 80 mcg/min (08/10/22 0730)   PRN Meds: sodium chloride, acetaminophen, linaclotide, meclizine, nitroGLYCERIN, oxyCODONE-acetaminophen **AND** oxyCODONE, senna, sodium chloride flush   Vital Signs    Vitals:   08/10/22 0400 08/10/22 0500 08/10/22 0633 08/10/22 0700  BP: 126/82 133/84 (!) 150/91 (!) 143/87  Pulse: 96     Resp: 18     Temp: 98.4 F (36.9 C)   99.6 F (37.6 C)  TempSrc: Oral   Oral  SpO2: 98% 96%  97%  Weight:  99.6 kg      Intake/Output Summary (Last 24 hours) at 08/10/2022 1003 Last data filed at 08/09/2022 2237 Gross per 24 hour  Intake 990.19 ml  Output 1300 ml  Net -309.81 ml      08/10/2022    5:00 AM 08/09/2022    4:41 AM 08/08/2022    5:00 AM  Last 3 Weights  Weight (lbs) 219 lb 9.3 oz 223 lb 8.7 oz 229  lb 8 oz  Weight (kg) 99.6 kg 101.4 kg 104.1 kg     Telemetry    Sinus tach occ PVCs - Personally Reviewed  ECG    pending - Personally Reviewed  Physical Exam   GEN: No acute distress.  HEENT: Normocephalic, atraumatic, sclera non-icteric. Neck: No JVD or bruits. Cardiac: Reg, borderline tachycardic, no murmurs, rubs, or gallops.  Respiratory: DIminished at bases bilaterally coarse throughout otherwise no wheezing or rhonchi. Breathing is unlabored. GI: Soft, nontender, non-distended, BS +x 4. MS: no deformity. Extremities: No clubbing or cyanosis. Trace BLE soft puffy edema. Distal pedal pulses are 2+ and equal bilaterally. Neuro:  AAOx3. Follows commands. Psych:  Responds to questions appropriately with a flattened affect.  Labs    High Sensitivity Troponin:   Recent Labs  Lab 08/07/22 2129 08/07/22 2315 08/09/22 1019 08/09/22 1148  TROPONINIHS 31* 32* 30* 28*      Cardiac EnzymesNo results for input(s): "TROPONINI" in the last 168 hours. No results for input(s): "TROPIPOC" in the last 168 hours.   Chemistry Recent Labs  Lab 08/08/22 0550 08/09/22 0550 08/10/22 0058  NA 141 137 137  K 3.7 4.0 3.9  CL 109 108 107  CO2 19* 21* 22  GLUCOSE 84 125* 142*  BUN 43* 43* 42*  CREATININE 2.86* 3.41* 3.50*  CALCIUM 8.3* 8.1* 8.3*  GFRNONAA 20* 16* 15*  ANIONGAP Hematology Recent Labs  Lab 08/07/22 2129 08/09/22 0550 08/10/22 0058  WBC 7.7 11.0* 12.7*  RBC 3.75* 3.31* 3.34*  HGB 9.4* 8.5* 8.3*  HCT 27.7* 23.9* 25.0*  MCV 73.9* 72.2* 74.9*  MCH 25.1* 25.7* 24.9*  MCHC 33.9 35.6 33.2  RDW 17.7* 17.3* 17.4*  PLT 412* 362 383    BNP Recent Labs  Lab 08/07/22 2129  BNP 2,047.2*     DDimer No results for input(s): "DDIMER" in the last 168 hours.   Radiology    VAS US RENAL ARTERY DUPLEX  Result Date: 08/09/2022 ABDOMINAL VISCERAL Patient Name:  Shirley [name]  Date of Exam:   08/09/2022 Medical Rec #: [id number]         Accession #:     [id number] Date of Birth: 09/25/1975         Patient Gender: F Patient Age:   47 years Exam Location:  [facility] Procedure:      VAS US RENAL ARTERY DUPLEX Referring Phys: [id number] LINDSAY B ROBERTS -------------------------------------------------------------------------------- Indications: Hypertension High Risk Factors: Hypertension, hyperlipidemia, Diabetes, past history of                    smoking, coronary artery disease, prior CVA. Other Factors: CHF, CKD4. Limitations: Obesity, air/bowel gas and respiratory variation, positioning. Comparison Study: No previous exams Performing Technologist: Jody Hill RVT, RDMS  Examination Guidelines: A complete evaluation includes B-mode imaging, spectral Doppler, color Doppler, and power Doppler as needed of all accessible portions of each vessel. Bilateral testing is considered an integral part of a complete examination. Limited examinations for reoccurring indications may be performed as noted.  Duplex Findings: +--------------------+--------+--------+------+--------+ Mesenteric          PSV cm/sEDV cm/sPlaqueComments +--------------------+--------+--------+------+--------+ Aorta Prox            108      0                   +--------------------+--------+--------+------+--------+ Celiac Artery Origin  117                          +--------------------+--------+--------+------+--------+ SMA Origin            220      20                  +--------------------+--------+--------+------+--------+    +------------------+--------+--------+-------+ Right Renal ArteryPSV cm/sEDV cm/sComment +------------------+--------+--------+-------+ Origin               82      13           +------------------+--------+--------+-------+ Proximal             72      12           +------------------+--------+--------+-------+ Mid                  54      14           +------------------+--------+--------+-------+ Distal               53       12           +------------------+--------+--------+-------+ +-----------------+--------+--------+-------+ Left Renal ArteryPSV cm/sEDV cm/sComment +-----------------+--------+--------+-------+ Origin  76      12           +-----------------+--------+--------+-------+ Proximal            62      9            +-----------------+--------+--------+-------+ Mid                 56      10           +-----------------+--------+--------+-------+ Distal              49      8            +-----------------+--------+--------+-------+ +------------+--------+--------+----+-----------+--------+--------+----+ Right KidneyPSV cm/sEDV cm/sRI  Left KidneyPSV cm/sEDV cm/sRI   +------------+--------+--------+----+-----------+--------+--------+----+ Upper Pole                      Upper Pole                      +------------+--------+--------+----+-----------+--------+--------+----+ Mid         22      4       0.        13      0       0.98 +------------+--------+--------+----+-----------+--------+--------+----+ Lower Pole                      Lower Pole 11      0       1.00 +------------+--------+--------+----+-----------+--------+--------+----+ Hilar       33      6       0.82Hilar      28      5       0.83 +------------+--------+--------+----+-----------+--------+--------+----+ +------------------+-----+------------------+----------------+ Right Kidney           Left Kidney                        +------------------+-----+------------------+----------------+ RAR                    RAR                                +------------------+-----+------------------+----------------+ RAR (manual)      0.76 RAR (manual)      0.70             +------------------+-----+------------------+----------------+ Cortex                 Cortex            9.0 / 2.2 (0.75) +------------------+-----+------------------+----------------+ Cortex  thickness       Corex thickness                    +------------------+-----+------------------+----------------+ Kidney length (cm)10.10Kidney length (cm)10.40            +------------------+-----+------------------+----------------+  Summary: Renal:  Right: No evidence of right renal artery stenosis. Abnormal right        Resistive Index. Normal size right kidney. RRV flow present. Left:  No evidence of left renal artery stenosis. Abnormal left        Resisitve Index. Normal size of left kidney. LRV flow        present. Mesenteric: Areas of limited visceral study include right parenchymal flow.  *See table(s) above for measurements and observations.  Diagnosing physician: Gerarda Fraction  Electronically signed by Gerarda Fraction  on 08/09/2022 at 10:32:23 PM.    Final     Cardiac Studies   Renal duplex as above  Last echo 02/2022   1. Left ventricular ejection fraction, by estimation, is 45 to 50%. The  left ventricle has mildly decreased function. The left ventricle  demonstrates global hypokinesis. There is mild concentric left ventricular  hypertrophy. Indeterminate diastolic  filling due to E-A fusion.   2. Right ventricular systolic function is normal. The right ventricular  size is normal. There is mildly elevated pulmonary artery systolic  pressure. The estimated right ventricular systolic pressure is 35.9 mmHg.   3. Left atrial size was moderately dilated.   4. A small pericardial effusion is present. The pericardial effusion is  circumferential.   5. The mitral valve is normal in structure. Mild to moderate mitral valve  regurgitation.   6. Tricuspid valve regurgitation is mild to moderate.   7. The aortic valve is tricuspid. Aortic valve regurgitation is not  visualized. No aortic stenosis is present.    Patient Profile     47 y.o. 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 Found to have CHF and hyperkalemia. Reported adherence with her medicines.  Assessment & Plan    1. Acute on chronic HFmrEF, cardiomyopathy - consider repeat limited echo (EF 45-50% in 02/2022) - etiology of cardiomyopathy not discerned at that time, will await MD input on this but not a good cath candidate with advanced CKD - HIV nonreactive 2 months ago - will check TSH given sinus tachycardia - currently on amlodipine 10mg  daily, carvedilol 25mg  BID, Farxiga 10mg  daily, LAsix 60mg  IV BID, hydralazine 100mg  TID, Imdur 60mg  daily, and IV NTG drip at - weight down 10lb, I/O's not very impressive - will review med recs with MD  2. CAD s/p inferior MI 2008 tx with PDA angioplasty, having persistent chest tightness SOB - minimally elevated troponin felt due to demand ischemia but given recurrent symptoms will f/u EKG and troponin this AM - not on ASA given anemia, Eliquis  - on BB, statin - addendum: EKG unchanged from prior - sinus tach 104bpm, one PVC, nonspecific TW flattening without acute STT changes from prior, await f/u troponin, will review with MD  3. HTN - renal duplex neg for RAS - check thyroid - consider nephrology input for nephrotic syndrome, >300 protein on prior UAs - otherwise follow in context above  4. CKD stage IV - baseline Cr difficult to discern, ranging 2s-mid 3's since January, 2.78 on admission at 3.5 today - may benefit from nephrology involvement, reported to follow as OP  5. Acute on chronic anemia, leukocytosis - Hgb waxing/waning per record review, downtrending to 8.3 -  recommend further management per primary team, may need iron supplementation if recurrently deficient  6. H/o substance abuse - prior cocaine use last positive 2019-2021, UDS pending  7. Recent PE 03/2022 with DVT and prior hx of DVT - notes indicated consideration of 3-6 mo duration of Eliquis vs lifelong anticoagulation given prior VTE events - defer to medicine team  For questions or updates, please contact [facility] Please consult [email or web address]  for contact info under Cardiology/STEMI.  Signed, Laurann Montana, PA-C 08/10/2022, 10:03 AM    Attending Note:   The patient was seen and examined.  Agree with assessment and plan as noted above.  Changes made to the above note as needed.  Patient seen and independently examined with Ronie Spies, PA .   We discussed all aspects of the encounter. I agree with the assessment and plan as stated above.   Acute on chronic combined systolic and diastolic congestive heart failure.  Her congestive heart failure is complicated by the fact that she has significant CKD.  She continues to get IV Lasix.  She has clear lungs and no peripheral edema.  There is no JVD.  Her creatinine continues to increase.  Will discontinue the Lasix at this point.  Continue other medications.  At some point she will need to see nephrology.  Will continue to follow u  2.  History of CAD: She continues to have intermittent episodes of chest discomfort.  Troponin levels remain the same.  I do not think this is an ACS.  3.  Hypertension: Renal duplex was negative for renal artery stenosis.     I have spent a total of 40 minutes with patient reviewing hospital  notes , telemetry, EKGs, labs and examining patient as well as establishing an assessment and plan that was discussed with the patient.  > 50% of time was spent in direct patient care.    Vesta Mixer, Montez Hageman., MD, [facility] 08/10/2022, 1:13 PM [address],  [address] Office [phone] Pager  [phone]

## 2022-08-10 NOTE — Progress Notes (Signed)
   Heart Failure Stewardship Pharmacist Progress Note   PCP: Bettey Costa, NP PCP-Cardiologist: Kristeen Miss, MD    HPI:  47 yo F with PMH of PE/DVT, CAD, T2DM, HLD, CKD, obesity, polysubstance use, OSA, diabetic gastroparesis, and DKA.   Admitted from 03/20/22 to 03/24/22 with acute PE and DVT now taking Eliquis. ECHO at that time showed LVEF 45-50%, mild concentric LVH, RV normal.   Admitted from 05/16/22 to 05/23/22 with acute on chronic CHF exacerbation. Diuresed 5 lbs and discharged with lasix 60 mg daily. Had HF TOC follow up on 2/7. BP was elevated 180/102 and + leg edema. Hydralazine and imdur were increased.   Presented back to the ED on 4/15 with shortness of breath, weight gain, orthopnea, and LE edema over the last 2 days. Hypertensive 190-200s on arrival and started on IV nitroglycerin. CXR with cardiomegaly, vascular congestion, and small pleural effusions. Renal artery duplex shows no evidence of renal artery stenosis.  Current HF Medications: Diuretic: furosemide 60 mg IV BID Beta Blocker: carvedilol 25 mg BID SGLT2i: Farxiga 10 mg daily Other: hydralazine 100 mg TID + Imdur 60 mg daily *also on amlodipine 10 mg daily and nitroglycerin drip  Prior to admission HF Medications: Diuretic: furosemide 20 mg TID Beta blocker: metoprolol XL 100 mg daily SGLT2i: Farxiga 10 mg daily Other: hydralazine 50 mg TID + Imdur 60 mg daily *not taking amlodipine or carvedilol  Pertinent Lab Values: Serum creatinine 3.50, BUN 42, Potassium 3.9, Sodium 137, BNP 2047.2  Vital Signs: Weight: 219 lbs (admission weight: 229 lbs) Blood pressure: 140/80s  Heart rate: 100s  I/O: net -2.7L  Medication Assistance / Insurance Benefits Check: Does the patient have prescription insurance?  Yes Type of insurance plan: [place] [employer]  Outpatient Pharmacy:  Prior to admission outpatient pharmacy: [facility] Is the patient willing to use [facility] TOC pharmacy at discharge? Yes Is the patient willing  to transition their outpatient pharmacy to utilize a [facility] outpatient pharmacy?   No    Assessment: 1. Acute on chronic systolic and diastolic CHF (LVEF 45-50%). NYHA class III symptoms. - Continue furosemide 60 mg IV BID. Strict I/Os and daily weights. Keep K>4 and Mg>2. - Continue carvedilol 25 mg BID - Continue Farxiga 10 mg daily  - Continue hydralazine 100 mg TID - Continue Imdur 60 mg daily, can increase if needed as nitro drip weans off - Continue amlodipine 10 mg daily  Plan: 1) Medication changes recommended at this time: - Continue IV diuresis - Increase Imdur to 90 mg daily  2) Patient assistance: - None pending, has [place] Medicaid  3)  Education  - Patient has been educated on current HF medications and potential additions to HF medication regimen - Patient verbalizes understanding that over the next few months, these medication doses may change and more medications may be added to optimize HF regimen - Patient has been educated on basic disease state pathophysiology and goals of therapy   Sharen Hones, PharmD, BCPS Heart Failure Stewardship Pharmacist Phone [phone]

## 2022-08-10 NOTE — Progress Notes (Signed)
PROGRESS NOTE Shirley [name]  ZOX:[id number] DOB: Mar 04, 1976 DOA: 08/07/2022 PCP: Bettey Costa, NP  Brief Narrative/Hospital Course: 47 y.o.f w/ Chronic HFrEF (45-50%, PAH), CKD 4, CAD s/p PCI, stroke, DM2, OSA, h/o DVT/PE on eliquis presented to the ED with complaint of dyspnea orthopnea and bilateral lower leg swelling and about 20 pound weight gain over past 2 days In E C: BP 190-200 systolic on arrival.  Labs with BNP elevated 2047, troponin 31> 32 flat, hyperkalemia 5.7, acidosis bicarb 19, creatinine 2.7 BUN 45 hemoglobin 9.4 platelet 412 pregnancy test negative. Chest x-ray cardiomegaly and vascular congestion with small pleural effusion consistent with CHF. Patient was Given Lasix 80, BP meds, Lokelma and NTG drip and admitted for acute on chronic systolic CHF and severe hypertension.    Subjective: Patient seen and examined this morning Still complains of shortness of breath, chest tightness and leg edema Overnight afebrile, SBP in 120s to 130s mostly  Creatinine slightly up to 3.5 hemoglobin 8.3 WBC 12.7 Weight down to 219 pound on admission 229 lb Uop 1800 cc  Assessment and Plan: Principal Problem:   Acute on chronic systolic CHF (congestive heart failure) Active Problems:   Severe hypertension   CKD (chronic kidney disease) stage 4, GFR 15-29 ml/min   CAD S/P percutaneous coronary angioplasty   Type 2 diabetes mellitus with hyperlipidemia   History of pulmonary embolism   Chronic pain   Acute on chronic systolic CHF Cardiomyopathy Severe hypertension Chronic cor pulmonale: Admitted on nitroglycerin drip, IV Lasix> seen by cardiology Lasix dose increased 4/16 to 60 mg bid> still has leg edema, symptomatic.continue to monitor intake output net balance see below.  Last EF 45-50%-unable to determine diastolic dysfunction although olmesartan she has diastolic dysfunction.  Follow-up renal artery duplex. Continue fluid restriction.  Appreciate cardiology input.  Continue  antihypertensive regimen amlodipine Coreg, with increased dose of hydralazine and Imdur.  GDMT per cardiology-appreciate close follow-up and monitoring. Having net negative balance and weight is improving Net IO Since Admission: -2,409.95 mL [08/10/22 0823]  Filed Weights   08/08/22 0500 08/09/22 0441 08/10/22 0500  Weight: 104.1 kg 101.4 kg 99.6 kg   Chest pain this morning: Appreciate cardiology input follow-up troponin likely in the setting of #1   AKI on CKD stage IV: baseline of 2.7 recently has been up to 3.3 back in January, creatinine uptrending, in the setting of diuresis, continue plan as per cardiology, monitor intake output renal function and avoid nephrotoxic medication hypotension.  If creatinine continues to worsen may need nephrology consultation Recent Labs    05/19/22 0134 05/20/22 0031 05/21/22 8119 05/22/22 0043 05/23/22 0123 05/31/22 1528 08/07/22 2129 08/08/22 0550 08/09/22 0550 08/10/22 0058  BUN 30* 36* 46* 45* 48* 37* 45* 43* 43* 42*  CREATININE 2.79* 3.13* 3.27* 3.41* 3.39* 2.64* 2.78* 2.86* 3.41* 3.50*     Hyperkalemia resolved with Lokelma  CAD with history of percutaneous coronary angioplasty Mildly elevated troponin: Troponins are flat likely demand ischemia from hypertension.Continue Eliquis beta-blocker and statin.   T2DM on Farxiga: Currently well-controlled on SSI , farxiga, continue same Recent Labs  Lab 08/09/22 0603 08/09/22 1109 08/09/22 1650 08/09/22 2128 08/10/22 0611  GLUCAP 127* 167* 145* 165* 143*     History of PE on Eliquis, continue  Chronic pain on Percocet History of CVA Class I Obesity with untreated JYN:WGNFAOZ'H Body mass index is 34.39 kg/m. : Will benefit with PCP follow-up, weight loss  healthy lifestyle and outpatient sleep evaluation.   DVT prophylaxis: Place and  maintain sequential compression device Start: 08/09/22 1404eliquis Code Status:   Code Status: Full Code Family Communication: plan of care discussed  with patient at bedside. Patient status is: Inpatient because of CHF AKI Level of care: Progressive   Dispo: The patient is from: home            Anticipated disposition: TBD ~ 2 days Objective: Vitals last 24 hrs: Vitals:   08/10/22 0400 08/10/22 0500 08/10/22 0633 08/10/22 0700  BP: 126/82 133/84 (!) 150/91 (!) 143/87  Pulse: 96     Resp: 18     Temp: 98.4 F (36.9 C)     TempSrc: Oral     SpO2: 98% 96%  97%  Weight:  99.6 kg     Weight change: -1.8 kg  Physical Examination: General exam: alert awake, oriented, appears anxious HEENT:Oral mucosa moist, Ear/Nose WNL grossly Respiratory system: Bilaterally diminished BS, no use of accessory muscle Cardiovascular system: S1 & S2 +, No JVD. Gastrointestinal system: Abdomen soft,NT,ND, BS+ Nervous System: Alert, awake, moving extremities, shefollows commands. Extremities: LE edema ++ b/l ankle,distal peripheral pulses palpable.  Skin: No rashes,no icterus. MSK: Normal muscle bulk,tone, power  Medications reviewed:  Scheduled Meds:  amLODipine  10 mg Oral Daily   apixaban  5 mg Oral BID   atorvastatin  40 mg Oral Daily   carvedilol  25 mg Oral BID WC   Chlorhexidine Gluconate Cloth  6 each Topical Q0600   dapagliflozin propanediol  10 mg Oral QAC breakfast   famotidine  20 mg Oral QPM   ferrous sulfate  325 mg Oral Q breakfast   furosemide  60 mg Intravenous BID   hydrALAZINE  100 mg Oral Q8H   insulin aspart  0-6 Units Subcutaneous TID WC   isosorbide mononitrate  60 mg Oral Daily   mupirocin ointment  1 Application Nasal BID   ondansetron (ZOFRAN) IV  4 mg Intravenous Once   pantoprazole  40 mg Oral Daily   sodium chloride flush  3 mL Intravenous Q12H   Continuous Infusions:  sodium chloride     nitroGLYCERIN 80 mcg/min (08/10/22 0730)    Diet Order             Diet heart healthy/carb modified Room service appropriate? Yes; Fluid consistency: Thin; Fluid restriction: 1200 mL Fluid  Diet effective now                   Intake/Output Summary (Last 24 hours) at 08/10/2022 0823 Last data filed at 08/09/2022 2237 Gross per 24 hour  Intake 990.19 ml  Output 1300 ml  Net -309.81 ml    Net IO Since Admission: -2,409.95 mL [08/10/22 0823]  Wt Readings from Last 3 Encounters:  08/10/22 99.6 kg  05/31/22 94.8 kg  05/23/22 99 kg     Unresulted Labs (From admission, onward)     Start     Ordered   08/09/22 0500  CBC  Daily,   R      08/08/22 0819   08/08/22 0507  Basic metabolic panel  Daily,   R     Comments: As Scheduled for 5 days    08/08/22 0506          Data Reviewed: I have personally reviewed following labs and imaging studies CBC: Recent Labs  Lab 08/07/22 2129 08/09/22 0550 08/10/22 0058  WBC 7.7 11.0* 12.7*  HGB 9.4* 8.5* 8.3*  HCT 27.7* 23.9* 25.0*  MCV 73.9* 72.2* 74.9*  PLT 412* 362 383  Basic Metabolic Panel: Recent Labs  Lab 08/07/22 2129 08/08/22 0550 08/09/22 0550 08/10/22 0058  NA 140 141 137 137  K 5.7* 3.7 4.0 3.9  CL 111 109 108 107  CO2 19* 19* 21* 22  GLUCOSE 92 84 125* 142*  BUN 45* 43* 43* 42*  CREATININE 2.78* 2.86* 3.41* 3.50*  CALCIUM 8.7* 8.3* 8.1* 8.3*    Recent Labs  Lab 08/09/22 0603 08/09/22 1109 08/09/22 1650 08/09/22 2128 08/10/22 0611  GLUCAP 127* 167* 145* 165* 143*    Recent Results (from the past 240 hour(s))  MRSA Next Gen by PCR, Nasal     Status: Abnormal   Collection Time: 08/08/22  7:24 PM   Specimen: Nasal Mucosa; Nasal Swab  Result Value Ref Range Status   MRSA by PCR Next Gen DETECTED (A) NOT DETECTED Final    Comment: RESULT CALLED TO, READ BACK BY AND VERIFIED WITH: L STEVENSON,RN@2152  08/07/20 MK (NOTE) The GeneXpert MRSA Assay (FDA approved for NASAL specimens only), is one component of a comprehensive MRSA colonization surveillance program. It is not intended to diagnose MRSA infection nor to guide or monitor treatment for MRSA infections. Test performance is not FDA approved in patients less  than 57 years old. Performed at [facility], [address] [place], [place] [address]     Antimicrobials: Anti-infectives (From admission, onward)    None      Culture/Microbiology None  Radiology Studies: VAS US RENAL ARTERY DUPLEX  Result Date: 08/09/2022 ABDOMINAL VISCERAL Patient Name:  Shirley [name]  Date of Exam:   08/09/2022 Medical Rec #: [id number]         Accession #:    [id number] Date of Birth: 26-Jan-1976         Patient Gender: F Patient Age:   86 years Exam Location:  [facility] Procedure:      VAS US RENAL ARTERY DUPLEX Referring Phys: [id number] LINDSAY B ROBERTS -------------------------------------------------------------------------------- Indications: Hypertension High Risk Factors: Hypertension, hyperlipidemia, Diabetes, past history of                    smoking, coronary artery disease, prior CVA. Other Factors: CHF, CKD4. Limitations: Obesity, air/bowel gas and respiratory variation, positioning. Comparison Study: No previous exams Performing Technologist: Jody Hill RVT, RDMS  Examination Guidelines: A complete evaluation includes B-mode imaging, spectral Doppler, color Doppler, and power Doppler as needed of all accessible portions of each vessel. Bilateral testing is considered an integral part of a complete examination. Limited examinations for reoccurring indications may be performed as noted.  Duplex Findings: +--------------------+--------+--------+------+--------+ Mesenteric          PSV cm/sEDV cm/sPlaqueComments +--------------------+--------+--------+------+--------+ Aorta Prox            108      0                   +--------------------+--------+--------+------+--------+ Celiac Artery Origin  117                          +--------------------+--------+--------+------+--------+ SMA Origin            220      20                  +--------------------+--------+--------+------+--------+     +------------------+--------+--------+-------+ Right Renal ArteryPSV cm/sEDV cm/sComment +------------------+--------+--------+-------+ Origin               82      13           +------------------+--------+--------+-------+  Proximal             72      12           +------------------+--------+--------+-------+ Mid                  54      14           +------------------+--------+--------+-------+ Distal               53      12           +------------------+--------+--------+-------+ +-----------------+--------+--------+-------+ Left Renal ArteryPSV cm/sEDV cm/sComment +-----------------+--------+--------+-------+ Origin              76      12           +-----------------+--------+--------+-------+ Proximal            62      9            +-----------------+--------+--------+-------+ Mid                 56      10           +-----------------+--------+--------+-------+ Distal              49      8            +-----------------+--------+--------+-------+ +------------+--------+--------+----+-----------+--------+--------+----+ Right KidneyPSV cm/sEDV cm/sRI  Left KidneyPSV cm/sEDV cm/sRI   +------------+--------+--------+----+-----------+--------+--------+----+ Upper Pole                      Upper Pole                      +------------+--------+--------+----+-----------+--------+--------+----+ Mid         22      4       0.        13      0       0.98 +------------+--------+--------+----+-----------+--------+--------+----+ Lower Pole                      Lower Pole 11      0       1.00 +------------+--------+--------+----+-----------+--------+--------+----+ Hilar       33      6       0.82Hilar      28      5       0.83 +------------+--------+--------+----+-----------+--------+--------+----+ +------------------+-----+------------------+----------------+ Right Kidney           Left Kidney                         +------------------+-----+------------------+----------------+ RAR                    RAR                                +------------------+-----+------------------+----------------+ RAR (manual)      0.76 RAR (manual)      0.70             +------------------+-----+------------------+----------------+ Cortex                 Cortex            9.0 / 2.2 (0.75) +------------------+-----+------------------+----------------+ Cortex thickness       Corex thickness                    +------------------+-----+------------------+----------------+  Kidney length (cm)10.10Kidney length (cm)10.40            +------------------+-----+------------------+----------------+  Summary: Renal:  Right: No evidence of right renal artery stenosis. Abnormal right        Resistive Index. Normal size right kidney. RRV flow present. Left:  No evidence of left renal artery stenosis. Abnormal left        Resisitve Index. Normal size of left kidney. LRV flow        present. Mesenteric: Areas of limited visceral study include right parenchymal flow.  *See table(s) above for measurements and observations.  Diagnosing physician: Gerarda Fraction  Electronically signed by Gerarda Fraction on 08/09/2022 at 10:32:23 PM.    Final      LOS: 2 days   Lanae Boast, MD [facility]  08/10/2022, 8:23 AM

## 2022-08-10 NOTE — Plan of Care (Signed)
  Problem: Education: Goal: Knowledge of General Education information will improve Description: Including pain rating scale, medication(s)/side effects and non-pharmacologic comfort measures Outcome: Progressing   Problem: Health Behavior/Discharge Planning: Goal: Ability to manage health-related needs will improve Outcome: Progressing   Problem: Clinical Measurements: Goal: Ability to maintain clinical measurements within normal limits will improve Outcome: Progressing Goal: Will remain free from infection Outcome: Progressing Goal: Diagnostic test results will improve Outcome: Progressing Goal: Respiratory complications will improve Outcome: Progressing Goal: Cardiovascular complication will be avoided Outcome: Progressing   Problem: Activity: Goal: Risk for activity intolerance will decrease Outcome: Progressing   Problem: Nutrition: Goal: Adequate nutrition will be maintained Outcome: Progressing   Problem: Coping: Goal: Level of anxiety will decrease Outcome: Progressing   Problem: Elimination: Goal: Will not experience complications related to bowel motility Outcome: Progressing Goal: Will not experience complications related to urinary retention Outcome: Progressing   Problem: Pain Managment: Goal: General experience of comfort will improve Outcome: Progressing   Problem: Safety: Goal: Ability to remain free from injury will improve Outcome: Progressing   Problem: Skin Integrity: Goal: Risk for impaired skin integrity will decrease Outcome: Progressing   Problem: Education: Goal: Ability to demonstrate management of disease process will improve Outcome: Progressing Goal: Ability to verbalize understanding of medication therapies will improve Outcome: Progressing Goal: Individualized Educational Video(s) Outcome: Progressing   Problem: Activity: Goal: Capacity to carry out activities will improve Outcome: Progressing   Problem: Cardiac: Goal:  Ability to achieve and maintain adequate cardiopulmonary perfusion will improve Outcome: Progressing   Problem: Education: Goal: Ability to describe self-care measures that may prevent or decrease complications (Diabetes Survival Skills Education) will improve Outcome: Progressing Goal: Individualized Educational Video(s) Outcome: Progressing   Problem: Coping: Goal: Ability to adjust to condition or change in health will improve Outcome: Progressing   Problem: Fluid Volume: Goal: Ability to maintain a balanced intake and output will improve Outcome: Progressing   Problem: Health Behavior/Discharge Planning: Goal: Ability to identify and utilize available resources and services will improve Outcome: Progressing Goal: Ability to manage health-related needs will improve Outcome: Progressing   Problem: Metabolic: Goal: Ability to maintain appropriate glucose levels will improve Outcome: Progressing   Problem: Nutritional: Goal: Maintenance of adequate nutrition will improve Outcome: Progressing Goal: Progress toward achieving an optimal weight will improve Outcome: Progressing   Problem: Skin Integrity: Goal: Risk for impaired skin integrity will decrease Outcome: Progressing   Problem: Tissue Perfusion: Goal: Adequacy of tissue perfusion will improve Outcome: Progressing   

## 2022-08-11 ENCOUNTER — Inpatient Hospital Stay (HOSPITAL_COMMUNITY): Payer: Medicare HMO

## 2022-08-11 DIAGNOSIS — I5023 Acute on chronic systolic (congestive) heart failure: Secondary | ICD-10-CM | POA: Diagnosis not present

## 2022-08-11 LAB — BASIC METABOLIC PANEL
Anion gap: 6 (ref 5–15)
BUN: 41 mg/dL — ABNORMAL HIGH (ref 6–20)
CO2: 24 mmol/L (ref 22–32)
Calcium: 8.2 mg/dL — ABNORMAL LOW (ref 8.9–10.3)
Chloride: 106 mmol/L (ref 98–111)
Creatinine, Ser: 3.8 mg/dL — ABNORMAL HIGH (ref 0.44–1.00)
GFR, Estimated: 14 mL/min — ABNORMAL LOW (ref >60)
Glucose, Bld: 201 mg/dL — ABNORMAL HIGH (ref 70–99)
Potassium: 3.7 mmol/L (ref 3.5–5.1)
Sodium: 136 mmol/L (ref 135–145)

## 2022-08-11 LAB — GLUCOSE, CAPILLARY
Glucose-Capillary: 141 mg/dL — ABNORMAL HIGH (ref 70–99)
Glucose-Capillary: 175 mg/dL — ABNORMAL HIGH (ref 70–99)
Glucose-Capillary: 147 mg/dL — ABNORMAL HIGH (ref 70–99)
Glucose-Capillary: 163 mg/dL — ABNORMAL HIGH (ref 70–99)

## 2022-08-11 LAB — CBC
HCT: 25.3 % — ABNORMAL LOW (ref 36.0–46.0)
Hemoglobin: 8.8 g/dL — ABNORMAL LOW (ref 12.0–15.0)
MCH: 25.4 pg — ABNORMAL LOW (ref 26.0–34.0)
MCHC: 34.8 g/dL (ref 30.0–36.0)
MCV: 72.9 fL — ABNORMAL LOW (ref 80.0–100.0)
Platelets: 364 10*3/uL (ref 150–400)
RBC: 3.47 MIL/uL — ABNORMAL LOW (ref 3.87–5.11)
RDW: 17.6 % — ABNORMAL HIGH (ref 11.5–15.5)
WBC: 11.2 10*3/uL — ABNORMAL HIGH (ref 4.0–10.5)
nRBC: 0 % (ref 0.0–0.2)

## 2022-08-11 LAB — URINALYSIS, ROUTINE W REFLEX MICROSCOPIC
Bilirubin Urine: NEGATIVE
Glucose, UA: >=500 mg/dL — AB
Hgb urine dipstick: NEGATIVE
Ketones, ur: NEGATIVE mg/dL
Leukocytes,Ua: NEGATIVE
Nitrite: NEGATIVE
Protein, ur: 100 mg/dL — AB
Specific Gravity, Urine: 1.007 (ref 1.005–1.030)
pH: 5 (ref 5.0–8.0)

## 2022-08-11 MED ORDER — ALBUMIN HUMAN 25 % IV SOLN
25.0000 g | Freq: Two times a day (BID) | INTRAVENOUS | Status: DC
  Administered 2022-08-11 – 2022-08-12 (×2): 25 g via INTRAVENOUS
  Filled 2022-08-11 (×2): qty 100

## 2022-08-11 NOTE — Progress Notes (Signed)
Progress Note  Patient Name: Shirley [name] Date of Encounter: 08/11/2022  Primary Cardiologist: Kristeen Miss, MD  Subjective   47 yo with hx of mildly reduce LVEF 45-50%, CAD, CVA,  HTN Presented with CHF symptoms  With diuresis, she has developed worsening AKI.   I/O are -3.5 liters so far this admission   Lots of family in the room this am  Lots of questions / confusion  I tried to clarify where we are with our treatment plan    Inpatient Medications    Scheduled Meds:  amLODipine  10 mg Oral Daily   apixaban  5 mg Oral BID   atorvastatin  40 mg Oral Daily   carvedilol  25 mg Oral BID WC   Chlorhexidine Gluconate Cloth  6 each Topical Q0600   dapagliflozin propanediol  10 mg Oral QAC breakfast   famotidine  20 mg Oral QPM   ferrous sulfate  325 mg Oral Q breakfast   furosemide  60 mg Intravenous BID   hydrALAZINE  100 mg Oral Q8H   insulin aspart  0-6 Units Subcutaneous TID WC   isosorbide mononitrate  60 mg Oral Daily   mupirocin ointment  1 Application Nasal BID   ondansetron (ZOFRAN) IV  4 mg Intravenous Once   pantoprazole  40 mg Oral Daily   sodium chloride flush  3 mL Intravenous Q12H   Continuous Infusions:  sodium chloride     nitroGLYCERIN 45 mcg/min (08/11/22 0110)   PRN Meds: sodium chloride, acetaminophen, linaclotide, meclizine, nitroGLYCERIN, oxyCODONE-acetaminophen **AND** oxyCODONE, senna, sodium chloride flush   Vital Signs    Vitals:   08/11/22 0333 08/11/22 0701 08/11/22 0702 08/11/22 0926  BP: 138/81 (!) 148/81 (!) 148/81 (!) 140/81  Pulse: 95  98 99  Resp: 15  18 16   Temp: 98.9 F (37.2 C)   99.2 F (37.3 C)  TempSrc: Oral  Oral Oral  SpO2: 97%   98%  Weight: 99.2 kg 100 kg      Intake/Output Summary (Last 24 hours) at 08/11/2022 0929 Last data filed at 08/11/2022 0110 Gross per 24 hour  Intake 360 ml  Output 1650 ml  Net -1290 ml       08/11/2022    7:01 AM 08/11/2022    3:33 AM 08/10/2022    5:00 AM  Last 3 Weights   Weight (lbs) 220 lb 7.4 oz 218 lb 11.1 oz 219 lb 9.3 oz  Weight (kg) 100 kg 99.2 kg 99.6 kg     Telemetry    Sinus tach occ PVCs - Personally Reviewed  ECG    pending - Personally Reviewed  Physical Exam   Physical Exam: Blood pressure (!) 140/81, pulse 99, temperature 99.2 F (37.3 C), temperature source Oral, resp. rate 16, weight 100 kg, SpO2 98 %.     GEN:  Well nourished, well developed in no acute distress HEENT: Normal NECK: No JVD; No carotid bruits LYMPHATICS: No lymphadenopathy CARDIAC: RRR   RESPIRATORY:   no rales,  some wheezing  ABDOMEN: Soft, non-tender, non-distended MUSCULOSKELETAL:  No edema; No deformity  SKIN: Warm and dry NEUROLOGIC:  Alert and oriented x 3   Labs    High Sensitivity Troponin:   Recent Labs  Lab 08/07/22 2129 08/07/22 2315 08/09/22 1019 08/09/22 1148 08/10/22 1054  TROPONINIHS 31* 32* 30* 28* 31*       Cardiac EnzymesNo results for input(s): "TROPONINI" in the last 168 hours. No results for input(s): "TROPIPOC" in the last 168 hours.  Chemistry Recent Labs  Lab 08/09/22 0550 08/10/22 0058 08/11/22 0129  NA 137 137 136  K 4.0 3.9 3.7  CL 108 107 106  CO2 21* 22 24  GLUCOSE 125* 142* 201*  BUN 43* 42* 41*  CREATININE 3.41* 3.50* 3.80*  CALCIUM 8.1* 8.3* 8.2*  GFRNONAA 16* 15* 14*  ANIONGAP Hematology Recent Labs  Lab 08/09/22 0550 08/10/22 0058 08/11/22 0129  WBC 11.0* 12.7* 11.2*  RBC 3.31* 3.34* 3.47*  HGB 8.5* 8.3* 8.8*  HCT 23.9* 25.0* 25.3*  MCV 72.2* 74.9* 72.9*  MCH 25.7* 24.9* 25.4*  MCHC 35.6 33.2 34.8  RDW 17.3* 17.4* 17.6*  PLT 362 383 364     BNP Recent Labs  Lab 08/07/22 2129  BNP 2,047.2*      DDimer No results for input(s): "DDIMER" in the last 168 hours.   Radiology    No results found.  Cardiac Studies   Renal duplex as above  Last echo 02/2022   1. Left ventricular ejection fraction, by estimation, is 45 to 50%. The  left ventricle has mildly  decreased function. The left ventricle  demonstrates global hypokinesis. There is mild concentric left ventricular  hypertrophy. Indeterminate diastolic  filling due to E-A fusion.   2. Right ventricular systolic function is normal. The right ventricular  size is normal. There is mildly elevated pulmonary artery systolic  pressure. The estimated right ventricular systolic pressure is 35.9 mmHg.   3. Left atrial size was moderately dilated.   4. A small pericardial effusion is present. The pericardial effusion is  circumferential.   5. The mitral valve is normal in structure. Mild to moderate mitral valve  regurgitation.   6. Tricuspid valve regurgitation is mild to moderate.   7. The aortic valve is tricuspid. Aortic valve regurgitation is not  visualized. No aortic stenosis is present.    Patient Profile     47 y.o. 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 Found to have CHF and hyperkalemia. Reported adherence with her medicines.  Assessment & Plan    1. Acute on chronic HFmrEF, cardiomyopathy - consider repeat limited echo (EF 45-50% in 02/2022) Hx of MI in the past as well as poorly controlled HTN  She may benefit from a right heart cath    2. CAD s/p inferior MI 2008 tx with PDA angioplasty, having persistent chest tightness SOB    3. HTN - renal duplex neg for RAS    4. CKD stage IV Discussed with Dr. Jonathon Bellows He will consult nephrology   5. Acute on chronic anemia, leukocytosis  Hb is 8.8  6. H/o  substance abuse - prior cocaine use last positive 2019-2021, UDS is negative  7. Recent PE 03/2022 with DVT and prior hx of DVT - notes indicated consideration of 3-6 mo duration of Eliquis vs lifelong anticoagulation given prior VTE events - defer to medicine team  For questions or updates, please contact [facility] Please consult [email or web address] for contact info under Cardiology/STEMI.    Kristeen Miss, MD  08/11/2022 9:48 AM    [facility]  [facility] [address]  [address] [place], [place]  [address] Phone: [phone]; Fax: [phone]

## 2022-08-11 NOTE — Plan of Care (Signed)
  Problem: Education: Goal: Knowledge of General Education information will improve Description: Including pain rating scale, medication(s)/side effects and non-pharmacologic comfort measures Outcome: Progressing   Problem: Health Behavior/Discharge Planning: Goal: Ability to manage health-related needs will improve Outcome: Progressing   Problem: Clinical Measurements: Goal: Ability to maintain clinical measurements within normal limits will improve Outcome: Progressing Goal: Will remain free from infection Outcome: Progressing Goal: Diagnostic test results will improve Outcome: Progressing Goal: Respiratory complications will improve Outcome: Progressing Goal: Cardiovascular complication will be avoided Outcome: Progressing   Problem: Activity: Goal: Risk for activity intolerance will decrease Outcome: Progressing   Problem: Nutrition: Goal: Adequate nutrition will be maintained Outcome: Progressing   Problem: Coping: Goal: Level of anxiety will decrease Outcome: Progressing   Problem: Elimination: Goal: Will not experience complications related to bowel motility Outcome: Progressing Goal: Will not experience complications related to urinary retention Outcome: Progressing   Problem: Pain Managment: Goal: General experience of comfort will improve Outcome: Progressing   Problem: Safety: Goal: Ability to remain free from injury will improve Outcome: Progressing   Problem: Skin Integrity: Goal: Risk for impaired skin integrity will decrease Outcome: Progressing   Problem: Education: Goal: Ability to demonstrate management of disease process will improve Outcome: Progressing Goal: Ability to verbalize understanding of medication therapies will improve Outcome: Progressing Goal: Individualized Educational Video(s) Outcome: Progressing   Problem: Activity: Goal: Capacity to carry out activities will improve Outcome: Progressing   Problem: Cardiac: Goal:  Ability to achieve and maintain adequate cardiopulmonary perfusion will improve Outcome: Progressing   Problem: Education: Goal: Ability to describe self-care measures that may prevent or decrease complications (Diabetes Survival Skills Education) will improve Outcome: Progressing Goal: Individualized Educational Video(s) Outcome: Progressing   Problem: Coping: Goal: Ability to adjust to condition or change in health will improve Outcome: Progressing   Problem: Fluid Volume: Goal: Ability to maintain a balanced intake and output will improve Outcome: Progressing   Problem: Health Behavior/Discharge Planning: Goal: Ability to identify and utilize available resources and services will improve Outcome: Progressing Goal: Ability to manage health-related needs will improve Outcome: Progressing   Problem: Metabolic: Goal: Ability to maintain appropriate glucose levels will improve Outcome: Progressing   Problem: Nutritional: Goal: Maintenance of adequate nutrition will improve Outcome: Progressing Goal: Progress toward achieving an optimal weight will improve Outcome: Progressing   Problem: Skin Integrity: Goal: Risk for impaired skin integrity will decrease Outcome: Progressing   Problem: Tissue Perfusion: Goal: Adequacy of tissue perfusion will improve Outcome: Progressing   

## 2022-08-11 NOTE — Progress Notes (Signed)
   Heart Failure Stewardship Pharmacist Progress Note   PCP: Bettey Costa, NP PCP-Cardiologist: Kristeen Miss, MD    HPI:  47 yo F with PMH of PE/DVT, CAD, T2DM, HLD, CKD, obesity, polysubstance use, OSA, diabetic gastroparesis, and DKA.   Admitted from 03/20/22 to 03/24/22 with acute PE and DVT now taking Eliquis. ECHO at that time showed LVEF 45-50%, mild concentric LVH, RV normal.   Admitted from 05/16/22 to 05/23/22 with acute on chronic CHF exacerbation. Diuresed 5 lbs and discharged with lasix 60 mg daily. Had HF TOC follow up on 2/7. BP was elevated 180/102 and + leg edema. Hydralazine and imdur were increased.   Presented back to the ED on 4/15 with shortness of breath, weight gain, orthopnea, and LE edema over the last 2 days. Hypertensive 190-200s on arrival and started on IV nitroglycerin. CXR with cardiomegaly, vascular congestion, and small pleural effusions. Renal artery duplex shows no evidence of renal artery stenosis.  Current HF Medications: Diuretic: furosemide 60 mg IV BID Beta Blocker: carvedilol 25 mg BID SGLT2i: Farxiga 10 mg daily Other: hydralazine 100 mg TID + Imdur 60 mg daily *also on amlodipine 10 mg daily and nitroglycerin drip  Prior to admission HF Medications: Diuretic: furosemide 20 mg TID Beta blocker: metoprolol XL 100 mg daily SGLT2i: Farxiga 10 mg daily Other: hydralazine 50 mg TID + Imdur 60 mg daily *not taking amlodipine or carvedilol  Pertinent Lab Values: Serum creatinine 3.80, BUN 41, Potassium 3.7, Sodium 136, BNP 2047.2  Vital Signs: Weight: 220 lbs (admission weight: 229 lbs) Blood pressure: 140/80s  Heart rate: 90s  I/O: -1.2L yesterday; net -3.5L  Medication Assistance / Insurance Benefits Check: Does the patient have prescription insurance?  Yes Type of insurance plan: [employer] [facility] Medicaid  Outpatient Pharmacy:  Prior to admission outpatient pharmacy: [facility] Is the patient willing to use [facility] TOC pharmacy at discharge? Yes Is the  patient willing to transition their outpatient pharmacy to utilize a [facility] outpatient pharmacy?   No    Assessment: 1. Acute on chronic systolic and diastolic CHF (LVEF 45-50%). NYHA class III symptoms. - Continue furosemide 60 mg IV BID. Estimated dry weight ~210 lbs. Strict I/Os and daily weights. Keep K>4 and Mg>2. - Continue carvedilol 25 mg BID - Continue Farxiga 10 mg daily  - Continue hydralazine 100 mg TID - Continue Imdur 60 mg daily, can increase if needed as nitro drip weans off - Continue amlodipine 10 mg daily  Plan: 1) Medication changes recommended at this time: - Increase Imdur to 90 mg daily to help wean nitro drip  2) Patient assistance: - None pending, has [place] Medicaid  3)  Education  - Patient has been educated on current HF medications and potential additions to HF medication regimen - Patient verbalizes understanding that over the next few months, these medication doses may change and more medications may be added to optimize HF regimen - Patient has been educated on basic disease state pathophysiology and goals of therapy   Sharen Hones, PharmD, BCPS Heart Failure Stewardship Pharmacist Phone [phone]

## 2022-08-11 NOTE — Care Management Important Message (Signed)
Important Message  Patient Details  Name: Shirley [name] MRN: [id number] Date of Birth: 08-08-1975   Medicare Important Message Given:  Yes     Renie Ora 08/11/2022, 9:22 AM

## 2022-08-11 NOTE — Consult Note (Signed)
Nephrology Consult   Assessment/Recommendations:  AKI on CKD4 -Baseline creatinine seems to be around 2.6, followed at [facility] -nonoliguric. Possibly ongoing CRS +/- med effect -stop farxiga -c/w lasix  IV BID for now, will albumin w/ lasix for 2 doses to see if this helps -renal u/s -urine sediment check -would recommend RHC at this junction for better volume assessment (how much of this is third spacing?) -I did explain to her that if her kidney function worsens and no longer responds to diuretics despite escalating doses then may need to consider renal replacement therapy down the road -Avoid nephrotoxic medications including NSAIDs and iodinated intravenous contrast exposure unless the latter is absolutely indicated.  Preferred narcotic agents for pain control are hydromorphone, fentanyl, and methadone. Morphine should not be used. Avoid Baclofen and avoid oral sodium phosphate and magnesium citrate based laxatives / bowel preps. Continue strict Input and Output monitoring. Will monitor the patient closely with you and intervene or adjust therapy as indicated by changes in clinical status/labs   Acute on chronic combined HFrEF exacerbation, chronic cor pulmonale -Would recommend RHC -Cardiology following, diuresis as above.  On NTG drip -Daily labs, strict I's and O's, daily weights -will check UPC and UACR -Net negative around 3.4 L since admit  Hypertension -BP acceptable  Hyperkalemia -Resolved  Anemia -Will check iron panel -Transfuse as needed for hemoglobin less than 7  DM2 with hyperglycemia -Management per primary service  History of DVT/PE -On Eliquis  Tequila Rottmann [facility] 08/11/2022 1:39 PM   _____________________________________________________________________________________   History of Present Illness: Shirley [name] is a/an 47 y.o. female with a past medical history of CKD 4, HFrEF, pulmonary arterial hypertension, CAD status post  PCI, history of CVA, DM2, OSA, history of DVT/PE on Eliquis who presents to [facility] with shortness of breath, worsening swelling, orthopnea, 20 pound weight gain over the last 3 days.  Markedly hypertensive, BP 1 90-200 systolic on arrival.  Started on nitroglycerin drip which continues.  Was also found to be mildly hypokalemic with a K of 5.7, started on Lokelma which stopped.  She has been receiving IV Lasix.  She reports that she has been feeling a Below better every single day.  Still has some swelling and some dyspnea on exertion and some chest heaviness.  No other complaints at the time of my consultation. Followed by Dr. Kathrene Bongo for CKD4 care   Medications:  Current Facility-Administered Medications  Medication Dose Route Frequency Provider Last Rate Last Admin   0.9 %  sodium chloride infusion  250 mL Intravenous PRN Hillary Bow, DO       acetaminophen (TYLENOL) tablet 650 mg  650 mg Oral Q4H PRN Hillary Bow, DO       amLODipine (NORVASC) tablet 10 mg  10 mg Oral Daily Hunsucker, Lesia Sago, MD   10 mg at 08/11/22 2725   apixaban (ELIQUIS) tablet 5 mg  5 mg Oral BID Hillary Bow, DO   5 mg at 08/11/22 3664   atorvastatin (LIPITOR) tablet 40 mg  40 mg Oral Daily Lyda Perone M, DO   40 mg at 08/11/22 0939   carvedilol (COREG) tablet 25 mg  25 mg Oral BID WC Hunsucker, Lesia Sago, MD   25 mg at 08/11/22 4034   Chlorhexidine Gluconate Cloth 2 % PADS 6 each  6 each Topical Q0600 Lanae Boast, MD   6 each at 08/11/22 0656   dapagliflozin propanediol (FARXIGA) tablet 10 mg  10 mg Oral QAC  breakfast Lyda Perone M, DO   10 mg at 08/11/22 0824   famotidine (PEPCID) tablet 20 mg  20 mg Oral QPM Kc, Dayna Barker, MD   20 mg at 08/10/22 1711   ferrous sulfate tablet 325 mg  325 mg Oral Q breakfast Kc, Dayna Barker, MD   325 mg at 08/11/22 0823   furosemide (LASIX) injection 60 mg  60 mg Intravenous BID Laverda Page B, NP   60 mg at 08/11/22 0825   hydrALAZINE (APRESOLINE) tablet 100 mg  100 mg  Oral Q8H Laverda Page B, NP   100 mg at 08/11/22 0701   insulin aspart (novoLOG) injection 0-6 Units  0-6 Units Subcutaneous TID WC Lanae Boast, MD   1 Units at 08/11/22 1222   isosorbide mononitrate (IMDUR) 24 hr tablet 60 mg  60 mg Oral Daily Lyda Perone M, DO   60 mg at 08/11/22 1610   linaclotide (LINZESS) capsule 145 mcg  145 mcg Oral Daily PRN Lanae Boast, MD       meclizine (ANTIVERT) tablet 25 mg  25 mg Oral TID PRN Lanae Boast, MD       mupirocin ointment (BACTROBAN) 2 % 1 Application  1 Application Nasal BID Kc, Dayna Barker, MD   1 Application at 08/11/22 0939   nitroGLYCERIN (NITROSTAT) SL tablet 0.4 mg  0.4 mg Sublingual Q5 min PRN Nahser, Deloris Ping, MD       nitroGLYCERIN 50 mg in dextrose 5 % 250 mL (0.2 mg/mL) infusion  0-200 mcg/min Intravenous Continuous Laverda Page B, NP 13.5 mL/hr at 08/11/22 0110 45 mcg/min at 08/11/22 0110   ondansetron (ZOFRAN) injection 4 mg  4 mg Intravenous Once Tanda Rockers A, DO       oxyCODONE-acetaminophen (PERCOCET/ROXICET) 5-325 MG per tablet 1 tablet  1 tablet Oral Q6H PRN Tanda Rockers A, DO   1 tablet at 08/11/22 1101   And   oxyCODONE (Oxy IR/ROXICODONE) immediate release tablet 5 mg  5 mg Oral Q6H PRN Tanda Rockers A, DO   5 mg at 08/11/22 1101   pantoprazole (PROTONIX) EC tablet 40 mg  40 mg Oral Daily Kc, Dayna Barker, MD   40 mg at 08/11/22 9604   senna (SENOKOT) tablet 8.6 mg  1 tablet Oral BID PRN Hillary Bow, DO       sodium chloride flush (NS) 0.9 % injection 3 mL  3 mL Intravenous Q12H Lyda Perone M, DO   3 mL at 08/11/22 5409   sodium chloride flush (NS) 0.9 % injection 3 mL  3 mL Intravenous PRN Hillary Bow, DO         ALLERGIES Morphine and related and Zestril [lisinopril]  MEDICAL HISTORY Past Medical History:  Diagnosis Date   Abscess of tunica vaginalis    10/09- Abundant S. aureus- sensitive to all abx   Anxiety    Blood dyscrasia    CAD (coronary artery disease) 06/15/2006   s/p Subendocardial MI with PDA  angioplasty(no stent) on 06/15/06 and relook  cath 06/19/06 showed patency of site. Cath 12/10- no restenosis or significant CAD progression   CVA (cerebral vascular accident) ~ 02/2014   denies residual on 04/22/2014   CVA (cerebral vascular accident)    history of remote right cerebellar infarct noted on head CT at least since 10/2011   Depression    Diabetes mellitus type 2, uncontrolled, with complications    Fibromyalgia    Gastritis    Gastroparesis    secondary to poorly controlled DM, last emptying  study performed 01/2010  was normal but may be falsely positive as pt was on reglan   GERD (gastroesophageal reflux disease)    Hepatitis B, chronic    Hep BeAb+,Hep B cAb+ & Hep BsAg+ (9/06)   History of pyelonephritis    H/o GrpB Pyelonephritis (9/06) and UTI- 07/11- E.Coli, 12/10- GBS   Hyperlipidemia    Hypertension    Iron deficiency anemia    Irregular menses    Small ovarian follicles seen on CT(9/06)   MI (myocardial infarction) 05/2006   PDA percutaneous transluminal coronary angioplasty   Migraine    "weekly" (04/22/2014)   N&V (nausea and vomiting)    Chronic. Unclear etiology with multiple admission and ED visits. CT abdomen with and without contrast (02/2011)  showed no acute process. Gastic Emptying scan (01/2010) was normal. Ultrasound of the abdomen was within normal limits. Hepatitis B viral load was undectable. HIV NR. EGD - gastritis, Hpylori + s/p Rx   New onset of congestive heart failure 05/16/2022   Obesity    OSA (obstructive sleep apnea)    "suppose to wear mask but I don't" (04/22/2014)   Peripheral neuropathy    Pneumonia    "this is probably the 2nd or 3rd time I've had pneumonia" (04/22/2014)   Recurrent boils    Seasonal asthma    Substance abuse    Thrombocytosis    Hem/Onc suggested 2/2 chronic hepatits and/or iron deficiency anemia     SOCIAL HISTORY Social History   Socioeconomic History   Marital status: Single    Spouse name: Not on  file   Number of children: 2   Years of education: 11   Highest education level: Not on file  Occupational History   Occupation: other    Comment: unemployed    Comment: disability  Tobacco Use   Smoking status: Former    Types: Cigarettes    Quit date: 04/24/1996    Years since quitting: 26.3   Smokeless tobacco: Never   Tobacco comments:    quit smoking cigarettes age 58  Vaping Use   Vaping Use: Never used  Substance and Sexual Activity   Alcohol use: No    Alcohol/week: 0.0 standard drinks of alcohol    Comment: 04/22/2014 "might have a few drinks a month"   Drug use: Not Currently    Types: Marijuana, Cocaine    Comment: 04/22/2104 "quit drugs ~ 1-2 yr ago"   Sexual activity: Yes    Birth control/protection: None  Other Topics Concern   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         Social Determinants of Health   Financial Resource Strain: Low Risk  (08/09/2022)   Overall Financial Resource Strain (CARDIA)    Difficulty of Paying Living Expenses: Not hard at all  Food Insecurity: No Food Insecurity (08/08/2022)   Hunger Vital Sign    Worried About Running Out of Food in the Last Year: Never true    Ran Out of Food in the Last Year: Never true  Transportation Needs: No Transportation Needs (08/08/2022)   PRAPARE - Administrator, Civil Service (Medical): No    Lack of Transportation (Non-Medical): No  Physical Activity: Not on file  Stress: Not on file  Social Connections: Not on file  Intimate Partner Violence: Not  At Risk (08/08/2022)   Humiliation, Afraid, Rape, and Kick questionnaire    Fear of Current or Ex-Partner: No    Emotionally Abused: No    Physically Abused: No    Sexually Abused: No     FAMILY HISTORY Family History  Problem Relation Age of Onset   Diabetes Father    Healthy  Mother      Review of Systems: 12 systems reviewed Otherwise as per HPI, all other systems reviewed and negative  Physical Exam: Vitals:   08/11/22 0926 08/11/22 1200  BP: (!) 140/81 (!) 158/78  Pulse: 99 100  Resp: 16 16  Temp: 99.2 F (37.3 C) 99.3 F (37.4 C)  SpO2: 98% 97%   No intake/output data recorded.  Intake/Output Summary (Last 24 hours) at 08/11/2022 1339 Last data filed at 08/11/2022 0110 Gross per 24 hour  Intake 360 ml  Output 1350 ml  Net -990 ml   General: well-appearing, no acute distress HEENT: anicteric sclera, oropharynx clear without lesions CV: regular rate, normal rhythm, no murmurs, no gallops, no rubs Lungs: Diminished breath sounds bibasilar, normal work of breathing at rest Abd: soft, non-tender, non-distended Skin: no visible lesions or rashes Psych: alert, engaged, appropriate mood and affect Musculoskeletal: 2+ pitting edema bilateral lower extremities Neuro: normal speech, no gross focal deficits   Test Results Reviewed Lab Results  Component Value Date   NA 136 08/11/2022   K 3.7 08/11/2022   CL 106 08/11/2022   CO2 24 08/11/2022   BUN 41 (H) 08/11/2022   CREATININE 3.80 (H) 08/11/2022   CALCIUM 8.2 (L) 08/11/2022   ALBUMIN 2.0 (L) 05/23/2022   PHOS 4.3 05/23/2022     I have reviewed all relevant outside healthcare records related to the patient's kidney injury.

## 2022-08-11 NOTE — Progress Notes (Signed)
PROGRESS NOTE TERREN HABERLE  WUJ:[id number] DOB: 1976-04-20 DOA: 08/07/2022 PCP: Bettey Costa, NP  Brief Narrative/Hospital Course: 47 y.o.f w/ Chronic HFrEF (45-50%, PAH), CKD 4, CAD s/p PCI, stroke, DM2, OSA, h/o DVT/PE on eliquis presented to the ED with complaint of dyspnea orthopnea and bilateral lower leg swelling and about 20 pound weight gain over past 2 days In E C: BP 190-200 systolic on arrival.  Labs with BNP elevated 2047, troponin 31> 32 flat, hyperkalemia 5.7, acidosis bicarb 19, creatinine 2.7 BUN 45 hemoglobin 9.4 platelet 412 pregnancy test negative. Chest x-ray cardiomegaly and vascular congestion with small pleural effusion consistent with CHF. Patient was Given Lasix 80, BP meds, Lokelma and NTG drip and admitted for acute on chronic systolic CHF and severe hypertension.    Subjective: Patient seen and examined this morning Overnight patient is afebrile BP fairly stable  Labs shows further worsening creatinine  Urine output 1.6 L but with unmeasured voids x 2> remains on IV Lasix.   Wt at 218> 220 lb on admission 229 Husband at the bedside Overall feels some better leg 20% since admission still has leg edema dyspnea on exertion and chest tightness  Assessment and Plan: Principal Problem:   Acute on chronic systolic CHF (congestive heart failure) Active Problems:   Severe hypertension   CKD (chronic kidney disease) stage 4, GFR 15-29 ml/min   CAD S/P percutaneous coronary angioplasty   Type 2 diabetes mellitus with hyperlipidemia   History of pulmonary embolism   Chronic pain   Acute on chronic combined systolic and diastolic CHF (congestive heart failure)   Acute on chronic systolic CHF Cardiomyopathy Severe hypertension Chronic cor pulmonale: Admitted on nitroglycerin drip, IV Lasix> seen by cardiology and managed with IV Lasix> now renal function worsening of close has been consulted discussed with cardiology this morning, remains on Lasix 60 mg q12hr-  still has leg edema.  Cardiology feels she will benefit with right heart cath. Last EF 45-50%-unable to determine diastolic dysfunction although suspecting diastolic dysfunction.  GDMT per cardiology continue to monitor intake, output, renal function, daily weight and continue fluid restriction.  Seen with negative balance and weight as below Net IO Since Admission: -3,459.95 mL [08/11/22 1110]  Filed Weights   08/10/22 0500 08/11/22 0333 08/11/22 0701  Weight: 99.6 kg 99.2 kg 100 kg   Chest pain: w/ DOE-in the setting of above.  Continue plan as above   AKI on CKD stage IV: baseline of 2.7 recently has been up to 3.3 back in January, creatinine uptrending-but he still appears to [facility] symptomatic nephrology has been consulted monitor renal function closely avoid nephrotoxic medication. Recent Labs    05/20/22 0031 05/21/22 0851 05/22/22 0043 05/23/22 0123 05/31/22 1528 08/07/22 2129 08/08/22 0550 08/09/22 0550 08/10/22 0058 08/11/22 0129  BUN 36* 46* 45* 48* 37* 45* 43* 43* 42* 41*  CREATININE 3.13* 3.27* 3.41* 3.39* 2.64* 2.78* 2.86* 3.41* 3.50* 3.80*    Hyperkalemia resolved with Lokelma  CAD with history of percutaneous coronary angioplasty Mildly elevated troponin: Troponins are flat likely demand ischemia from hypertension.Continue Eliquis beta-blocker and statin.   T2DM on Farxiga: Currently well-controlled on SSI , farxiga, continue same Recent Labs  Lab 08/10/22 0611 08/10/22 1146 08/10/22 1637 08/10/22 2100 08/11/22 0653  GLUCAP 143* 145* 177* 159* 141*  History of PE on Eliquis, continue  Chronic pain on Percocet  History of CVA Class I Obesity with untreated NFA:OZHYQMV'H Body mass index is 34.53 kg/m. : Will benefit with PCP follow-up, weight  loss  healthy lifestyle.  Refusing CPAP care.   DVT prophylaxis: Place and maintain sequential compression device Start: 08/09/22 1404eliquis Code Status:   Code Status: Full Code Family Communication: plan of care  discussed with patient at bedside. Patient status is: Inpatient because of CHF AKI Level of care: Progressive   Dispo: The patient is from: home            Anticipated disposition: TBD ~ 2 days Objective: Vitals last 24 hrs: Vitals:   08/11/22 0333 08/11/22 0701 08/11/22 0702 08/11/22 0926  BP: 138/81 (!) 148/81 (!) 148/81 (!) 140/81  Pulse: 95  98 99  Resp: 15  18 16   Temp: 98.9 F (37.2 C)   99.2 F (37.3 C)  TempSrc: Oral  Oral Oral  SpO2: 97%   98%  Weight: 99.2 kg 100 kg     Weight change: -0.4 kg  Physical Examination: General exam: AA oriented HEENT:Oral mucosa moist, Ear/Nose WNL grossly, dentition normal. Respiratory system: bilaterally diminished BS, no use of accessory muscle Cardiovascular system: S1 & S2 +, regular rate. Gastrointestinal system: Abdomen soft, NT,ND,BS+ Nervous System:Alert, awake, moving extremities and grossly nonfocal Extremities: LE ankle edema ++ in b/l ankle , lower extremities warm Skin: No rashes,no icterus. ZOX:WRUEAV muscle bulk,tone, power   Medications reviewed:  Scheduled Meds:  amLODipine  10 mg Oral Daily   apixaban  5 mg Oral BID   atorvastatin  40 mg Oral Daily   carvedilol  25 mg Oral BID WC   Chlorhexidine Gluconate Cloth  6 each Topical Q0600   dapagliflozin propanediol  10 mg Oral QAC breakfast   famotidine  20 mg Oral QPM   ferrous sulfate  325 mg Oral Q breakfast   furosemide  60 mg Intravenous BID   hydrALAZINE  100 mg Oral Q8H   insulin aspart  0-6 Units Subcutaneous TID WC   isosorbide mononitrate  60 mg Oral Daily   mupirocin ointment  1 Application Nasal BID   ondansetron (ZOFRAN) IV  4 mg Intravenous Once   pantoprazole  40 mg Oral Daily   sodium chloride flush  3 mL Intravenous Q12H   Continuous Infusions:  sodium chloride     nitroGLYCERIN 45 mcg/min (08/11/22 0110)    Diet Order             Diet heart healthy/carb modified Room service appropriate? Yes; Fluid consistency: Thin; Fluid restriction:  1200 mL Fluid  Diet effective now                  Intake/Output Summary (Last 24 hours) at 08/11/2022 1110 Last data filed at 08/11/2022 0110 Gross per 24 hour  Intake 360 ml  Output 1350 ml  Net -990 ml   Net IO Since Admission: -3,459.95 mL [08/11/22 1110]  Wt Readings from Last 3 Encounters:  08/11/22 100 kg  05/31/22 94.8 kg  05/23/22 99 kg     Unresulted Labs (From admission, onward)     Start     Ordered   08/09/22 0500  CBC  Daily,   R      08/08/22 0819   08/08/22 0507  Basic metabolic panel  Daily,   R     Comments: As Scheduled for 5 days    08/08/22 0506          Data Reviewed: I have personally reviewed following labs and imaging studies CBC: Recent Labs  Lab 08/07/22 2129 08/09/22 0550 08/10/22 0058 08/11/22 0129  WBC 7.7 11.0* 12.7*  11.2*  HGB 9.4* 8.5* 8.3* 8.8*  HCT 27.7* 23.9* 25.0* 25.3*  MCV 73.9* 72.2* 74.9* 72.9*  PLT 412* 362 383 364   Basic Metabolic Panel: Recent Labs  Lab 08/07/22 2129 08/08/22 0550 08/09/22 0550 08/10/22 0058 08/11/22 0129  NA 140 141 137 137 136  K 5.7* 3.7 4.0 3.9 3.7  CL 111 109 108 107 106  CO2 19* 19* 21* 22 24  GLUCOSE 92 84 125* 142* 201*  BUN 45* 43* 43* 42* 41*  CREATININE 2.78* 2.86* 3.41* 3.50* 3.80*  CALCIUM 8.7* 8.3* 8.1* 8.3* 8.2*   Recent Labs  Lab 08/10/22 0611 08/10/22 1146 08/10/22 1637 08/10/22 2100 08/11/22 0653  GLUCAP 143* 145* 177* 159* 141*   Recent Results (from the past 240 hour(s))  MRSA Next Gen by PCR, Nasal     Status: Abnormal   Collection Time: 08/08/22  7:24 PM   Specimen: Nasal Mucosa; Nasal Swab  Result Value Ref Range Status   MRSA by PCR Next Gen DETECTED (A) NOT DETECTED Final    Comment: RESULT CALLED TO, READ BACK BY AND VERIFIED WITH: L STEVENSON,RN@2152  08/07/20 MK (NOTE) The GeneXpert MRSA Assay (FDA approved for NASAL specimens only), is one component of a comprehensive MRSA colonization surveillance program. It is not intended to diagnose MRSA  infection nor to guide or monitor treatment for MRSA infections. Test performance is not FDA approved in patients less than 52 years old. Performed at [facility], [address], [place], [place] [address]     Antimicrobials: Anti-infectives (From admission, onward)    None      Culture/Microbiology None  Radiology Studies: No results found.   LOS: 3 days   Lanae Boast, MD [facility]  08/11/2022, 11:10 AM

## 2022-08-12 DIAGNOSIS — I5023 Acute on chronic systolic (congestive) heart failure: Secondary | ICD-10-CM | POA: Diagnosis not present

## 2022-08-12 LAB — GLUCOSE, CAPILLARY
Glucose-Capillary: 155 mg/dL — ABNORMAL HIGH (ref 70–99)
Glucose-Capillary: 147 mg/dL — ABNORMAL HIGH (ref 70–99)
Glucose-Capillary: 219 mg/dL — ABNORMAL HIGH (ref 70–99)
Glucose-Capillary: 198 mg/dL — ABNORMAL HIGH (ref 70–99)

## 2022-08-12 LAB — IRON AND TIBC
Iron: 22 ug/dL — ABNORMAL LOW (ref 28–170)
Saturation Ratios: 9 % — ABNORMAL LOW (ref 10.4–31.8)
TIBC: 252 ug/dL (ref 250–450)
UIBC: 230 ug/dL

## 2022-08-12 LAB — ALBUMIN: Albumin: 2.2 g/dL — ABNORMAL LOW (ref 3.5–5.0)

## 2022-08-12 LAB — CBC
HCT: 24.3 % — ABNORMAL LOW (ref 36.0–46.0)
Hemoglobin: 8.3 g/dL — ABNORMAL LOW (ref 12.0–15.0)
MCH: 25.5 pg — ABNORMAL LOW (ref 26.0–34.0)
MCHC: 34.2 g/dL (ref 30.0–36.0)
MCV: 74.5 fL — ABNORMAL LOW (ref 80.0–100.0)
Platelets: 352 10*3/uL (ref 150–400)
RBC: 3.26 MIL/uL — ABNORMAL LOW (ref 3.87–5.11)
RDW: 17.5 % — ABNORMAL HIGH (ref 11.5–15.5)
WBC: 10.3 10*3/uL (ref 4.0–10.5)
nRBC: 0 % (ref 0.0–0.2)

## 2022-08-12 LAB — FERRITIN: Ferritin: 28 ng/mL (ref 11–307)

## 2022-08-12 LAB — PROTEIN / CREATININE RATIO, URINE
Creatinine, Urine: 61 mg/dL
Protein Creatinine Ratio: 3.67 mg/mg{Cre} — ABNORMAL HIGH (ref 0.00–0.15)
Total Protein, Urine: 224 mg/dL

## 2022-08-12 LAB — BASIC METABOLIC PANEL
Anion gap: 6 (ref 5–15)
BUN: 46 mg/dL — ABNORMAL HIGH (ref 6–20)
CO2: 24 mmol/L (ref 22–32)
Calcium: 8.1 mg/dL — ABNORMAL LOW (ref 8.9–10.3)
Chloride: 104 mmol/L (ref 98–111)
Creatinine, Ser: 3.9 mg/dL — ABNORMAL HIGH (ref 0.44–1.00)
GFR, Estimated: 14 mL/min — ABNORMAL LOW (ref >60)
Glucose, Bld: 190 mg/dL — ABNORMAL HIGH (ref 70–99)
Potassium: 3.8 mmol/L (ref 3.5–5.1)
Sodium: 134 mmol/L — ABNORMAL LOW (ref 135–145)

## 2022-08-12 LAB — PHOSPHORUS: Phosphorus: 4.4 mg/dL (ref 2.5–4.6)

## 2022-08-12 MED ORDER — SODIUM CHLORIDE 0.9 % IV SOLN
250.0000 mg | Freq: Every day | INTRAVENOUS | Status: AC
  Administered 2022-08-12 – 2022-08-15 (×4): 250 mg via INTRAVENOUS
  Filled 2022-08-12 (×5): qty 20

## 2022-08-12 MED ORDER — HEPARIN (PORCINE) 25000 UT/250ML-% IV SOLN
1450.0000 [IU]/h | INTRAVENOUS | Status: AC
  Administered 2022-08-12: 1300 [IU]/h via INTRAVENOUS
  Administered 2022-08-13 – 2022-08-14 (×2): 1450 [IU]/h via INTRAVENOUS
  Filled 2022-08-12 (×3): qty 250

## 2022-08-12 NOTE — Progress Notes (Signed)
[facility] Progress Note    Assessment/ Plan:   AKI on CKD4 -Baseline creatinine seems to be around 2.6, followed at [facility] -nonoliguric. Possibly ongoing CRS +/- med effect. Stopped farxiga. No obstruction on u/s. Urine sediment okay -Cr stable, 3.9 (3.8 yday) -will hold on diuretics+albumin for today, reassess tomorrow -would recommend RHC at this junction for better volume assessment (how much of this is third spacing?) -I did explain to her initially that if her kidney function worsens and no longer responds to diuretics then may need to consider renal replacement therapy down the road, fortunately this is not the case currently -Avoid nephrotoxic medications including NSAIDs and iodinated intravenous contrast exposure unless the latter is absolutely indicated.  Preferred narcotic agents for pain control are hydromorphone, fentanyl, and methadone. Morphine should not be used. Avoid Baclofen and avoid oral sodium phosphate and magnesium citrate based laxatives / bowel preps. Continue strict Input and Output monitoring. Will monitor the patient closely with you and intervene or adjust therapy as indicated by changes in clinical status/labs    Acute on chronic combined HFrEF exacerbation, chronic cor pulmonale -Would recommend RHC -Cardiology following. On NTG drip -Daily labs, strict I's and O's, daily weights -Net negative around 5.1 L since admit -holding on diuretics for today   Hypertension -BP acceptable   Hyperkalemia -Resolved   Anemia -iron deplete, will start iron load, on PO Fe -Transfuse as needed for hemoglobin less than 7   DM2 with hyperglycemia -Management per primary service   History of DVT/PE -On Eliquis  Subjective:   No acute events. She does report some improvement in her swelling, slowly improving. Uop 2.4L   Objective:   BP (!) 140/83   Pulse 96   Temp (!) 97.5 F (36.4 C) (Oral)   Resp 16   Wt 99.6 kg   SpO2 96%   BMI 34.39  kg/m   Intake/Output Summary (Last 24 hours) at 08/12/2022 1237 Last data filed at 08/12/2022 1038 Gross per 24 hour  Intake 690 ml  Output 2900 ml  Net -2210 ml   Weight change: 0.8 kg  Physical Exam: Gen: NAD, laying flat on RA CVS: RRR Resp: CTA b/l, normal WOB Abd: soft, nt/nd Ext: trace edema b/l Les Neuro: awake, alert  Imaging: US RENAL  Result Date: 08/11/2022 CLINICAL DATA:  Acute kidney injury. EXAM: RENAL / URINARY TRACT ULTRASOUND COMPLETE COMPARISON:  Renal ultrasound 05/22/2022 FINDINGS: Right Kidney: Renal measurements: 9.0 x 5.1 x 5.3 cm = volume: 128 mL. Mildly increased parenchymal echogenicity. No mass or hydronephrosis visualized. Left Kidney: Renal measurements: 10.4 x 6.0 x 5.7 cm = volume: 187 mL. Mildly increased parenchymal echogenicity. No mass or hydronephrosis visualized. Bladder: Appears normal for degree of bladder distention. Other: None. IMPRESSION: Echogenic kidneys compatible with medical renal disease. No hydronephrosis. Electronically Signed   By: Sebastian Ache M.D.   On: 08/11/2022 15:27    Labs: BMET Recent Labs  Lab 08/07/22 2129 08/08/22 0550 08/09/22 0550 08/10/22 0058 08/11/22 0129 08/12/22 0132  NA 140 141 137 137 136 134*  K 5.7* 3.7 4.0 3.9 3.7 3.8  CL 111 109 108 107 106 104  CO2 19* 19* 21* GLUCOSE 92 84 125* 142* 201* 190*  BUN 45* 43* 43* 42* 41* 46*  CREATININE 2.78* 2.86* 3.41* 3.50* 3.80* 3.90*  CALCIUM 8.7* 8.3* 8.1* 8.3* 8.2* 8.1*  PHOS  --   --   --   --   --  4.4  CBC Recent Labs  Lab 08/09/22 0550 08/10/22 0058 08/11/22 0129 08/12/22 0132  WBC 11.0* 12.7* 11.2* 10.3  HGB 8.5* 8.3* 8.8* 8.3*  HCT 23.9* 25.0* 25.3* 24.3*  MCV 72.2* 74.9* 72.9* 74.5*  PLT 362 383 364 352    Medications:     amLODipine  10 mg Oral Daily   apixaban  5 mg Oral BID   atorvastatin  40 mg Oral Daily   carvedilol  25 mg Oral BID WC   Chlorhexidine Gluconate Cloth  6 each Topical Q0600   famotidine  20 mg Oral QPM    ferrous sulfate  325 mg Oral Q breakfast   hydrALAZINE  100 mg Oral Q8H   insulin aspart  0-6 Units Subcutaneous TID WC   isosorbide mononitrate  60 mg Oral Daily   mupirocin ointment  1 Application Nasal BID   ondansetron (ZOFRAN) IV  4 mg Intravenous Once   pantoprazole  40 mg Oral Daily   sodium chloride flush  3 mL Intravenous Q12H      Anthony Sar, MD [facility] 08/12/2022, 12:37 PM

## 2022-08-12 NOTE — Progress Notes (Addendum)
ANTICOAGULATION CONSULT NOTE   Pharmacy Consult for heparin Indication:  Hx DVT/PE and CVA  Allergies  Allergen Reactions   Morphine And Related Itching and Swelling   Zestril [Lisinopril] Nausea Only and Swelling    Patient Measurements: Weight: 99.6 kg (219 lb 9.3 oz) Heparin Dosing Weight: 83.8 kg  Vital Signs: Temp: 97.5 F (36.4 C) (04/20 1107) Temp Source: Oral (04/20 1107) BP: 140/83 (04/20 1107) Pulse Rate: 96 (04/20 0932)  Labs: Recent Labs    08/10/22 0058 08/10/22 1054 08/11/22 0129 08/12/22 0132  HGB 8.3*  --  8.8* 8.3*  HCT 25.0*  --  25.3* 24.3*  PLT 383  --  364 352  CREATININE 3.50*  --  3.80* 3.90*  TROPONINIHS  --  31*  --   --     Estimated Creatinine Clearance: 21.4 mL/min (A) (by C-G formula based on SCr of 3.9 mg/dL (H)).   Medical History: Past Medical History:  Diagnosis Date   Abscess of tunica vaginalis    10/09- Abundant S. aureus- sensitive to all abx   Anxiety    Blood dyscrasia    CAD (coronary artery disease) 06/15/2006   s/p Subendocardial MI with PDA angioplasty(no stent) on 06/15/06 and relook  cath 06/19/06 showed patency of site. Cath 12/10- no restenosis or significant CAD progression   CVA (cerebral vascular accident) ~ 02/2014   denies residual on 04/22/2014   CVA (cerebral vascular accident)    history of remote right cerebellar infarct noted on head CT at least since 10/2011   Depression    Diabetes mellitus type 2, uncontrolled, with complications    Fibromyalgia    Gastritis    Gastroparesis    secondary to poorly controlled DM, last emptying study performed 01/2010  was normal but may be falsely positive as pt was on reglan   GERD (gastroesophageal reflux disease)    Hepatitis B, chronic    Hep BeAb+,Hep B cAb+ & Hep BsAg+ (9/06)   History of pyelonephritis    H/o GrpB Pyelonephritis (9/06) and UTI- 07/11- E.Coli, 12/10- GBS   Hyperlipidemia    Hypertension    Iron deficiency anemia    Irregular menses     Small ovarian follicles seen on CT(9/06)   MI (myocardial infarction) 05/2006   PDA percutaneous transluminal coronary angioplasty   Migraine    "weekly" (04/22/2014)   N&V (nausea and vomiting)    Chronic. Unclear etiology with multiple admission and ED visits. CT abdomen with and without contrast (02/2011)  showed no acute process. Gastic Emptying scan (01/2010) was normal. Ultrasound of the abdomen was within normal limits. Hepatitis B viral load was undectable. HIV NR. EGD - gastritis, Hpylori + s/p Rx   New onset of congestive heart failure 05/16/2022   Obesity    OSA (obstructive sleep apnea)    "suppose to wear mask but I don't" (04/22/2014)   Peripheral neuropathy    Pneumonia    "this is probably the 2nd or 3rd time I've had pneumonia" (04/22/2014)   Recurrent boils    Seasonal asthma    Substance abuse    Thrombocytosis    Hem/Onc suggested 2/2 chronic hepatits and/or iron deficiency anemia   Assessment: 68 YOF with hx DVT/ PE and remote CVA, on Eliquis PTA. Pharmacy consulted to dose IV heparin while Eliquis is on hold for planned right heart catheterization on 4/22. Last dose Eliquis 10:06 on 4/20. Will start IV heparin when next dose would be due at 22:00. Hgb low stable  8.3, PLT 352 wnl.   Goal of Therapy:  Heparin level 0.3-0.7 units/ml Monitor platelets by anticoagulation protocol: Yes   Plan:  No bolus Start heparin at 1,300 units/hr at 22:00 Check 8 hour aPTT/heparin level Daily heparin level and aPTT until correlates Monitor for s/sx bleeding F/u ability to transition back to Eliquis   Larena Sox, PharmD PGY1 Pharmacy Resident   08/12/2022  12:59 PM

## 2022-08-12 NOTE — Progress Notes (Signed)
PROGRESS NOTE Shirley [name]  ZOX:[id number] DOB: April 25, 1975 DOA: 08/07/2022 PCP: Bettey Costa, NP  Brief Narrative/Hospital Course: 47 y.o.f w/ Chronic HFrEF (45-50%, PAH), CKD 4, CAD s/p PCI, stroke, DM2, OSA, h/o DVT/PE on eliquis presented to the ED with complaint of dyspnea orthopnea and bilateral lower leg swelling and about 20 pound weight gain over past 2 days In E C: BP 190-200 systolic on arrival.  Labs with BNP elevated 2047, troponin 31> 32 flat, hyperkalemia 5.7, acidosis bicarb 19, creatinine 2.7 BUN 45 hemoglobin 9.4 platelet 412 pregnancy test negative. Chest x-ray cardiomegaly and vascular congestion with small pleural effusion consistent with CHF. Patient was Given Lasix 80, BP meds, Lokelma and NTG drip and admitted for acute on chronic systolic CHF and severe hypertension.    Subjective: Patient seen and examined this morning Reports feeling some better but is still some shortness of breath, edema more on the right leg Overnight afebrile BP stable Labs shows slightly uptrending creatinine 3.9 hemoglobin 8.3 g Urine output 2.4 L. Weight 229  lb> down to 219 lb   Assessment and Plan: Principal Problem:   Acute on chronic systolic CHF (congestive heart failure) Active Problems:   Severe hypertension   CKD (chronic kidney disease) stage 4, GFR 15-29 ml/min   CAD S/P percutaneous coronary angioplasty   Type 2 diabetes mellitus with hyperlipidemia   History of pulmonary embolism   Chronic pain   Acute on chronic combined systolic and diastolic CHF (congestive heart failure)   Acute on chronic systolic CHF-also suspected diastolic dysfunction with her uncontrolled hypertension Cardiomyopathy Severe hypertension Chronic cor pulmonale: Remains on nitroglycerin drip, multiple antihypertensives/GDMT, diuresis and is still with leg edema, dyspnea on exertion.  Renal artery duplex no evidence of renal artery stenosis. Last EF 45-50%-unable to determine diastolic  dysfunction although suspecting diastolic dysfunction. >Nephrology has been consulted due to worsening creatinine, cardiology following closely.  Continue current IV Lasix per nephro/cardio. Cardiology feels she will benefit with right heart cath.  >On nitroglycerin drip hopefully can can wean off, defer to cardiology, continue amlodipine, Coreg, hydralazine, Imdur. continue to monitor intake, output, renal function, daily weight and continue fluid restriction.  Improving weight and net negative balance as below. Net IO Since Admission: -5,169.95 mL [08/12/22 0820]  Filed Weights   08/11/22 0333 08/11/22 0701 08/12/22 0342  Weight: 99.2 kg 100 kg 99.6 kg   Chest pain: w/ DOE-in the setting of above.  Continue plan as above on antihypertensives.   AKI on CKD stage IV: baseline CREAT ~2.6 recently has been up to 3.3 in January. Creatinine uptrending-nephrology input appreciated, nephrology recommends RHC at this point for better volume assessment, if renal function worsens  she may need to consider renal replacement therapy down the road >Avoid nephrotoxic medications including NSAIDs and iodinated intravenous contrast exposure unless the latter is absolutely indicated.Preferred narcotic agents for pain control WJX:BJYNWGNFAOZHY, fentanyl, and methadone and avoid morphine.Avoid Baclofen and avoid oral sodium phosphate and magnesium citrate based laxatives / bowel preps. Continue strict Input and Output monitoring and serial renal functions.  Recent Labs    05/21/22 0851 05/22/22 0043 05/23/22 0123 05/31/22 1528 08/07/22 2129 08/08/22 0550 08/09/22 0550 08/10/22 0058 08/11/22 0129 08/12/22 0132  BUN 46* 45* 48* 37* 45* 43* 43* 42* 41* 46*  CREATININE 3.27* 3.41* 3.39* 2.64* 2.78* 2.86* 3.41* 3.50* 3.80* 3.90*     Hyperkalemia resolved with Lokelma  CAD with history of percutaneous coronary angioplasty Mildly elevated troponin: Troponins are flat likely demand ischemia from  hypertension.Continue Eliquis beta-blocker and statin.   T2DM on Farxiga: Currently well-controlled on SSI , farxiga, continue same and monitor. Recent Labs  Lab 08/11/22 0653 08/11/22 1158 08/11/22 1521 08/11/22 2114 08/12/22 0613  GLUCAP 141* 175* 147* 163* 155*   History of PE on Eliquis  Chronic pain on Percocet  History of CVA Class I Obesity with untreated WUJ:WJXBJYN'W Body mass index is 34.39 kg/m. : Will benefit with PCP follow-up, weight loss  healthy lifestyle.  Refusing CPAP here  DVT prophylaxis: Place and maintain sequential compression device Start: 08/09/22 1404eliquis Code Status:   Code Status: Full Code Family Communication: plan of care discussed with patient at bedside. Patient status is: Inpatient because of CHF AKI Level of care: Progressive   Dispo: The patient is from: home            Anticipated disposition: TBD  Objective: Vitals last 24 hrs: Vitals:   08/11/22 2334 08/12/22 0258 08/12/22 0342 08/12/22 0436  BP: 112/66 136/79  (!) 141/82  Pulse: 90 97  90  Resp: Temp: 98.2 F (36.8 C) 98.2 F (36.8 C)    TempSrc: Oral Oral    SpO2: 96% 97%  96%  Weight:   99.6 kg    Weight change: 0.8 kg  Physical Examination: General exam: AAox3, weak,older appearing HEENT:Oral mucosa moist, Ear/Nose WNL grossly, dentition normal. Respiratory system: bilaterally clear BS,no use of accessory muscle Cardiovascular system: S1 & S2 +, regular rate. Gastrointestinal system: Abdomen soft, NT,ND,BS+ Nervous System:Alert, awake, moving extremities and grossly nonfocal Extremities: LE ankle edema pitting edema r>l Skin: No rashes,no icterus. MSK: Normal muscle bulk,tone, power   Medications reviewed:  Scheduled Meds:  amLODipine  10 mg Oral Daily   apixaban  5 mg Oral BID   atorvastatin  40 mg Oral Daily   carvedilol  25 mg Oral BID WC   Chlorhexidine Gluconate Cloth  6 each Topical Q0600   famotidine  20 mg Oral QPM   ferrous sulfate  325  mg Oral Q breakfast   furosemide  60 mg Intravenous BID   hydrALAZINE  100 mg Oral Q8H   insulin aspart  0-6 Units Subcutaneous TID WC   isosorbide mononitrate  60 mg Oral Daily   mupirocin ointment  1 Application Nasal BID   ondansetron (ZOFRAN) IV  4 mg Intravenous Once   pantoprazole  40 mg Oral Daily   sodium chloride flush  3 mL Intravenous Q12H   Continuous Infusions:  sodium chloride     albumin human 25 g (08/11/22 2158)   nitroGLYCERIN 35 mcg/min (08/11/22 2336)    Diet Order             Diet heart healthy/carb modified Room service appropriate? Yes; Fluid consistency: Thin; Fluid restriction: 1200 mL Fluid  Diet effective now                  Intake/Output Summary (Last 24 hours) at 08/12/2022 0820 Last data filed at 08/12/2022 0330 Gross per 24 hour  Intake 690 ml  Output 2400 ml  Net -1710 ml    Net IO Since Admission: -5,169.95 mL [08/12/22 0820]  Wt Readings from Last 3 Encounters:  08/12/22 99.6 kg  05/31/22 94.8 kg  05/23/22 99 kg     Unresulted Labs (From admission, onward)     Start     Ordered   08/12/22 0500  Microalbumin / creatinine urine ratio  Tomorrow morning,   R  08/11/22 1553   08/09/22 0500  CBC  Daily,   R      08/08/22 0819          Data Reviewed: I have personally reviewed following labs and imaging studies CBC: Recent Labs  Lab 08/07/22 2129 08/09/22 0550 08/10/22 0058 08/11/22 0129 08/12/22 0132  WBC 7.7 11.0* 12.7* 11.2* 10.3  HGB 9.4* 8.5* 8.3* 8.8* 8.3*  HCT 27.7* 23.9* 25.0* 25.3* 24.3*  MCV 73.9* 72.2* 74.9* 72.9* 74.5*  PLT 412* 362 383 364 352    Basic Metabolic Panel: Recent Labs  Lab 08/08/22 0550 08/09/22 0550 08/10/22 0058 08/11/22 0129 08/12/22 0132  NA 141 137 137 136 134*  K 3.7 4.0 3.9 3.7 3.8  CL 109 108 107 106 104  CO2 19* 21* GLUCOSE 84 125* 142* 201* 190*  BUN 43* 43* 42* 41* 46*  CREATININE 2.86* 3.41* 3.50* 3.80* 3.90*  CALCIUM 8.3* 8.1* 8.3* 8.2* 8.1*  PHOS  --    --   --   --  4.4    Recent Labs  Lab 08/11/22 0653 08/11/22 1158 08/11/22 1521 08/11/22 2114 08/12/22 0613  GLUCAP 141* 175* 147* 163* 155*    Recent Results (from the past 240 hour(s))  MRSA Next Gen by PCR, Nasal     Status: Abnormal   Collection Time: 08/08/22  7:24 PM   Specimen: Nasal Mucosa; Nasal Swab  Result Value Ref Range Status   MRSA by PCR Next Gen DETECTED (A) NOT DETECTED Final    Comment: RESULT CALLED TO, READ BACK BY AND VERIFIED WITH: L STEVENSON,RN@2152  08/07/20 MK (NOTE) The GeneXpert MRSA Assay (FDA approved for NASAL specimens only), is one component of a comprehensive MRSA colonization surveillance program. It is not intended to diagnose MRSA infection nor to guide or monitor treatment for MRSA infections. Test performance is not FDA approved in patients less than 18 years old. Performed at [facility], [address] [place], [place] [address]     Antimicrobials: Anti-infectives (From admission, onward)    None      Culture/Microbiology None  Radiology Studies: US RENAL  Result Date: 08/11/2022 CLINICAL DATA:  Acute kidney injury. EXAM: RENAL / URINARY TRACT ULTRASOUND COMPLETE COMPARISON:  Renal ultrasound 05/22/2022 FINDINGS: Right Kidney: Renal measurements: 9.0 x 5.1 x 5.3 cm = volume: 128 mL. Mildly increased parenchymal echogenicity. No mass or hydronephrosis visualized. Left Kidney: Renal measurements: 10.4 x 6.0 x 5.7 cm = volume: 187 mL. Mildly increased parenchymal echogenicity. No mass or hydronephrosis visualized. Bladder: Appears normal for degree of bladder distention. Other: None. IMPRESSION: Echogenic kidneys compatible with medical renal disease. No hydronephrosis. Electronically Signed   By: Sebastian Ache M.D.   On: 08/11/2022 15:27     LOS: 4 days   Lanae Boast, MD [facility]  08/12/2022, 8:20 AM

## 2022-08-12 NOTE — Progress Notes (Signed)
Mobility Specialist: Progress Note   08/12/22 1309  Mobility  Activity Ambulated with assistance in room  Level of Assistance Minimal assist, patient does 75% or more  Assistive Device None  Distance Ambulated (ft) 48 ft (24'x2)  Activity Response Tolerated fair  Mobility Referral Yes  $Mobility charge 1 Mobility   Post-Mobility: 100 HR, 148/82 (102) BP, 96% SpO2  Pt received in the bed and agreeable to mobility. Mod I with bed mobility and minA to stand. C/o mild dizziness and SOB during ambulation, VSS. Stopped x1 for seated break secondary to SOB. LOB x1 when turning requiring minA to correct. Pt back to bed after session with call bell and phone at her side.   Ermal Brzozowski Mobility Specialist Please contact via SecureChat or Rehab office at [phone]

## 2022-08-12 NOTE — Procedures (Signed)
Progress Note  Patient Name: Shirley [name] Date of Encounter: 08/12/2022  Primary Cardiologist: Kristeen Miss, MD  Subjective   RHC recommended over night from Cardiology and Nephrology. Feels the same. No CP, still have LE swelling.   Inpatient Medications    Scheduled Meds:  amLODipine  10 mg Oral Daily   apixaban  5 mg Oral BID   atorvastatin  40 mg Oral Daily   carvedilol  25 mg Oral BID WC   Chlorhexidine Gluconate Cloth  6 each Topical Q0600   famotidine  20 mg Oral QPM   ferrous sulfate  325 mg Oral Q breakfast   hydrALAZINE  100 mg Oral Q8H   insulin aspart  0-6 Units Subcutaneous TID WC   isosorbide mononitrate  60 mg Oral Daily   mupirocin ointment  1 Application Nasal BID   ondansetron (ZOFRAN) IV  4 mg Intravenous Once   pantoprazole  40 mg Oral Daily   sodium chloride flush  3 mL Intravenous Q12H   Continuous Infusions:  sodium chloride     nitroGLYCERIN Stopped (08/12/22 1225)   PRN Meds: sodium chloride, acetaminophen, linaclotide, meclizine, nitroGLYCERIN, oxyCODONE-acetaminophen **AND** oxyCODONE, senna, sodium chloride flush   Vital Signs    Vitals:   08/12/22 1058 08/12/22 1059 08/12/22 1100 08/12/22 1107  BP:    (!) 140/83  Pulse:      Resp: Temp:    (!) 97.5 F (36.4 C)  TempSrc:    Oral  SpO2:      Weight:        Intake/Output Summary (Last 24 hours) at 08/12/2022 1238 Last data filed at 08/12/2022 1038 Gross per 24 hour  Intake 690 ml  Output 2900 ml  Net -2210 ml      08/12/2022    3:42 AM 08/11/2022    7:01 AM 08/11/2022    3:33 AM  Last 3 Weights  Weight (lbs) 219 lb 9.3 oz 220 lb 7.4 oz 218 lb 11.1 oz  Weight (kg) 99.6 kg 100 kg 99.2 kg     Telemetry    SR with rare PACs; VTACH is artifact- Personally Reviewed  ECG    Sinus Tachy rare PVC - Personally Reviewed  Physical Exam   Physical Exam: Blood pressure (!) 140/83, pulse 96, temperature (!) 97.5 F (36.4 C), temperature source Oral, resp. rate  16, weight 99.6 kg, SpO2 96 %.     GEN:  NAD HEENT: Normal NECK: Thick neck CARDIAC: RRR   RESPIRATORY:  CTAB bilateral  ABDOMEN: Soft, non-tender, non-distended MUSCULOSKELETAL: Non pitting edema  SKIN: Warm and dry NEUROLOGIC:  Alert and oriented x 3   Labs    High Sensitivity Troponin:   Recent Labs  Lab 08/07/22 2129 08/07/22 2315 08/09/22 1019 08/09/22 1148 08/10/22 1054  TROPONINIHS 31* 32* 30* 28* 31*      Cardiac EnzymesNo results for input(s): "TROPONINI" in the last 168 hours. No results for input(s): "TROPIPOC" in the last 168 hours.   Chemistry Recent Labs  Lab 08/10/22 0058 08/11/22 0129 08/12/22 0132  NA 137 136 134*  K 3.9 3.7 3.8  CL 107 106 104  CO2 GLUCOSE 142* 201* 190*  BUN 42* 41* 46*  CREATININE 3.50* 3.80* 3.90*  CALCIUM 8.3* 8.2* 8.1*  ALBUMIN  --   --  2.2*  GFRNONAA 15* 14* 14*  ANIONGAP Hematology Recent Labs  Lab 08/10/22 1308 08/11/22 0129 08/12/22 0132  WBC 12.7* 11.2* 10.3  RBC 3.34* 3.47* 3.26*  HGB 8.3* 8.8* 8.3*  HCT 25.0* 25.3* 24.3*  MCV 74.9* 72.9* 74.5*  MCH 24.9* 25.4* 25.5*  MCHC 33.2 34.8 34.2  RDW 17.4* 17.6* 17.5*  PLT 383 364 352    BNP Recent Labs  Lab 08/07/22 2129  BNP 2,047.2*     DDimer No results for input(s): "DDIMER" in the last 168 hours.   Radiology    US RENAL  Result Date: 08/11/2022 CLINICAL DATA:  Acute kidney injury. EXAM: RENAL / URINARY TRACT ULTRASOUND COMPLETE COMPARISON:  Renal ultrasound 05/22/2022 FINDINGS: Right Kidney: Renal measurements: 9.0 x 5.1 x 5.3 cm = volume: 128 mL. Mildly increased parenchymal echogenicity. No mass or hydronephrosis visualized. Left Kidney: Renal measurements: 10.4 x 6.0 x 5.7 cm = volume: 187 mL. Mildly increased parenchymal echogenicity. No mass or hydronephrosis visualized. Bladder: Appears normal for degree of bladder distention. Other: None. IMPRESSION: Echogenic kidneys compatible with medical renal disease. No  hydronephrosis. Electronically Signed   By: Sebastian Ache M.D.   On: 08/11/2022 15:27    Cardiac Studies   Renal duplex as above  Last echo 02/2022   1. Left ventricular ejection fraction, by estimation, is 45 to 50%. The  left ventricle has mildly decreased function. The left ventricle  demonstrates global hypokinesis. There is mild concentric left ventricular  hypertrophy. Indeterminate diastolic  filling due to E-A fusion.   2. Right ventricular systolic function is normal. The right ventricular  size is normal. There is mildly elevated pulmonary artery systolic  pressure. The estimated right ventricular systolic pressure is 35.9 mmHg.   3. Left atrial size was moderately dilated.   4. A small pericardial effusion is present. The pericardial effusion is  circumferential.   5. The mitral valve is normal in structure. Mild to moderate mitral valve  regurgitation.   6. Tricuspid valve regurgitation is mild to moderate.   7. The aortic valve is tricuspid. Aortic valve regurgitation is not  visualized. No aortic stenosis is present.    Patient Profile     47 y.o. 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 Found to have CHF and hyperkalemia. Reported adherence with her medicines.  Assessment & Plan    Acute on chronic HFmrEF, cardiomyopathy - on Coreg 25 mg PO BID and IMDUR and hydralazine - complicated by AKI - Discussed RHC and will plan for RHC Monday; consented  CAD s/p inferior MI 2008 tx with PDA  angioplasty, having persistent chest tightness SOB - asymptomatic on my exam, her nitro drip was off when I left the room; will stop nitro   HTN - renal duplex neg for RAS   AKI on CKD stage IV - seeing nephrology,  creatinine worsening  Acute on chronic anemia Recent  PE with hx of DVT - planned to get IV iron and eliquis by [facility] - given planned procedure, will switch to heparin tonight  H/o substance abuse - prior cocaine use last positive 2019-2021, UDS is negative   For questions or updates, please contact [facility] Please consult [email or web address] for contact info under Cardiology/STEMI.  Riley Lam,  MD FASE [facility] Cardiologist [facility]  [address] [place], [address] [place], [place] [address] [phone]  12:49 PM

## 2022-08-12 NOTE — Progress Notes (Signed)
Attending and cardiologist on call contacted regarding nitro drip. Patient's chest pain resolved yesterday and blood pressure has been within goal overnight and into the morning. Awaiting orders.

## 2022-08-12 NOTE — Plan of Care (Signed)
  Problem: Education: Goal: Knowledge of General Education information will improve Description: Including pain rating scale, medication(s)/side effects and non-pharmacologic comfort measures Outcome: Progressing   Problem: Health Behavior/Discharge Planning: Goal: Ability to manage health-related needs will improve Outcome: Progressing   Problem: Clinical Measurements: Goal: Ability to maintain clinical measurements within normal limits will improve Outcome: Progressing Goal: Will remain free from infection Outcome: Progressing Goal: Respiratory complications will improve Outcome: Progressing Goal: Cardiovascular complication will be avoided Outcome: Progressing   Problem: Activity: Goal: Risk for activity intolerance will decrease Outcome: Progressing   Problem: Nutrition: Goal: Adequate nutrition will be maintained Outcome: Progressing   Problem: Coping: Goal: Level of anxiety will decrease Outcome: Progressing   Problem: Elimination: Goal: Will not experience complications related to bowel motility Outcome: Progressing Goal: Will not experience complications related to urinary retention Outcome: Progressing   Problem: Pain Managment: Goal: General experience of comfort will improve Outcome: Progressing   Problem: Safety: Goal: Ability to remain free from injury will improve Outcome: Progressing   Problem: Education: Goal: Ability to demonstrate management of disease process will improve Outcome: Progressing   Problem: Activity: Goal: Capacity to carry out activities will improve Outcome: Progressing

## 2022-08-13 DIAGNOSIS — I5023 Acute on chronic systolic (congestive) heart failure: Secondary | ICD-10-CM | POA: Diagnosis not present

## 2022-08-13 LAB — CBC
HCT: 26.2 % — ABNORMAL LOW (ref 36.0–46.0)
Hemoglobin: 9.2 g/dL — ABNORMAL LOW (ref 12.0–15.0)
MCH: 25.5 pg — ABNORMAL LOW (ref 26.0–34.0)
MCHC: 35.1 g/dL (ref 30.0–36.0)
MCV: 72.6 fL — ABNORMAL LOW (ref 80.0–100.0)
Platelets: 387 10*3/uL (ref 150–400)
RBC: 3.61 MIL/uL — ABNORMAL LOW (ref 3.87–5.11)
RDW: 17.6 % — ABNORMAL HIGH (ref 11.5–15.5)
WBC: 9.8 10*3/uL (ref 4.0–10.5)
nRBC: 0 % (ref 0.0–0.2)

## 2022-08-13 LAB — GLUCOSE, CAPILLARY
Glucose-Capillary: 156 mg/dL — ABNORMAL HIGH (ref 70–99)
Glucose-Capillary: 113 mg/dL — ABNORMAL HIGH (ref 70–99)
Glucose-Capillary: 197 mg/dL — ABNORMAL HIGH (ref 70–99)

## 2022-08-13 LAB — APTT
aPTT: 56 seconds — ABNORMAL HIGH (ref 24–36)
aPTT: 82 seconds — ABNORMAL HIGH (ref 24–36)
aPTT: 34 seconds (ref 24–36)

## 2022-08-13 LAB — BASIC METABOLIC PANEL
Anion gap: 7 (ref 5–15)
BUN: 46 mg/dL — ABNORMAL HIGH (ref 6–20)
CO2: 24 mmol/L (ref 22–32)
Calcium: 8.4 mg/dL — ABNORMAL LOW (ref 8.9–10.3)
Chloride: 105 mmol/L (ref 98–111)
Creatinine, Ser: 3.54 mg/dL — ABNORMAL HIGH (ref 0.44–1.00)
GFR, Estimated: 15 mL/min — ABNORMAL LOW (ref >60)
Glucose, Bld: 162 mg/dL — ABNORMAL HIGH (ref 70–99)
Potassium: 3.7 mmol/L (ref 3.5–5.1)
Sodium: 136 mmol/L (ref 135–145)

## 2022-08-13 LAB — HEPARIN LEVEL (UNFRACTIONATED): Heparin Unfractionated: >1.1 IU/mL — ABNORMAL HIGH (ref 0.30–0.70)

## 2022-08-13 MED ORDER — SODIUM CHLORIDE 0.9% FLUSH: 3.0000 mL | INTRAVENOUS | Status: DC | PRN

## 2022-08-13 MED ORDER — ASPIRIN 81 MG PO CHEW
81.0000 mg | CHEWABLE_TABLET | ORAL | Status: AC
  Administered 2022-08-14: 81 mg via ORAL
  Filled 2022-08-13: qty 1

## 2022-08-13 MED ORDER — ISOSORBIDE MONONITRATE ER 60 MG PO TB24
90.0000 mg | ORAL_TABLET | Freq: Every day | ORAL | Status: DC
  Administered 2022-08-14 – 2022-08-15 (×2): 90 mg via ORAL
  Filled 2022-08-13 (×2): qty 1

## 2022-08-13 MED ORDER — SODIUM CHLORIDE 0.9 % IV SOLN: INTRAVENOUS | Status: DC

## 2022-08-13 MED ORDER — SODIUM CHLORIDE 0.9% FLUSH
3.0000 mL | Freq: Two times a day (BID) | INTRAVENOUS | Status: DC
  Administered 2022-08-14 – 2022-08-15 (×3): 3 mL via INTRAVENOUS

## 2022-08-13 MED ORDER — SODIUM CHLORIDE 0.9 % IV SOLN: 250.0000 mL | INTRAVENOUS | Status: DC | PRN

## 2022-08-13 NOTE — Progress Notes (Signed)
Progress Note  Patient Name: Shirley [name] Date of Encounter: 08/13/2022  Primary Cardiologist: Kristeen Miss, MD  Subjective   Feels slightly better this AM. Fatigued.  Didn't get much sleep last night.  Inpatient Medications    Scheduled Meds:  amLODipine  10 mg Oral Daily   atorvastatin  40 mg Oral Daily   carvedilol  25 mg Oral BID WC   famotidine  20 mg Oral QPM   ferrous sulfate  325 mg Oral Q breakfast   hydrALAZINE  100 mg Oral Q8H   insulin aspart  0-6 Units Subcutaneous TID WC   isosorbide mononitrate  60 mg Oral Daily   mupirocin ointment  1 Application Nasal BID   ondansetron (ZOFRAN) IV  4 mg Intravenous Once   pantoprazole  40 mg Oral Daily   sodium chloride flush  3 mL Intravenous Q12H   Continuous Infusions:  sodium chloride     ferric gluconate (FERRLECIT) IVPB 250 mg (08/12/22 1906)   heparin 1,450 Units/hr (08/13/22 0751)   PRN Meds: sodium chloride, acetaminophen, linaclotide, meclizine, nitroGLYCERIN, oxyCODONE-acetaminophen **AND** oxyCODONE, senna, sodium chloride flush   Vital Signs    Vitals:   08/13/22 0019 08/13/22 0404 08/13/22 0445 08/13/22 0604  BP: 133/75 135/83  (!) 153/87  Pulse:  91    Resp:  15  17  Temp: 98.1 F (36.7 C) 98.1 F (36.7 C)    TempSrc: Oral Oral    SpO2: 98% 98%    Weight:   100.1 kg   Height:        Intake/Output Summary (Last 24 hours) at 08/13/2022 0800 Last data filed at 08/13/2022 0446 Gross per 24 hour  Intake 1697.4 ml  Output 1700 ml  Net -2.6 ml      08/13/2022    4:45 AM 08/12/2022    3:42 AM 08/11/2022    7:01 AM  Last 3 Weights  Weight (lbs) 220 lb 9.6 oz 219 lb 9.3 oz 220 lb 7.4 oz  Weight (kg) 100.064 kg 99.6 kg 100 kg     Telemetry    SR with rare PACs; NSVT and VTACH are artifact- Personally Reviewed   Physical Exam   Physical Exam: Blood pressure (!) 153/87, pulse 91, temperature 98.1 F (36.7 C), temperature source Oral, resp. rate 17, height  (1.702 m), weight 100.1  kg, SpO2 98 %.  Gen: no distress   Neck: Thick neck, unable to assess JVD Cardiac: No Rubs or Gallops, systolic murmur, RRR +2radial pulses Respiratory: Decreased breath sounds in the bases normal effort, normal  respiratory rate GI: Soft, nontender, non-distended  MS:  non pitting edema;  moves all extremities Integument: Skin feels warm Neuro:  At time of evaluation, alert and oriented to person/place/time/situation  Psych: Normal affect, patient feels ok    Labs    High Sensitivity Troponin:   Recent Labs  Lab 08/07/22 2129 08/07/22 2315 08/09/22 1019 08/09/22 1148 08/10/22 1054  TROPONINIHS 31* 32* 30* 28* 31*      Cardiac EnzymesNo results for input(s): "TROPONINI" in the last 168 hours. No results for input(s): "TROPIPOC" in the last 168 hours.   Chemistry Recent Labs  Lab 08/11/22 0129 08/12/22 0132 08/13/22 0617  NA 136 134* 136  K 3.7 3.8 3.7  CL 106 104 105  CO2 GLUCOSE 201* 190* 162*  BUN 41* 46* 46*  CREATININE 3.80* 3.90* 3.54*  CALCIUM 8.2* 8.1* 8.4*  ALBUMIN  --  2.2*  --  GFRNONAA 14* 14* 15*  ANIONGAP Hematology Recent Labs  Lab 08/11/22 0129 08/12/22 0132 08/13/22 0617  WBC 11.2* 10.3 9.8  RBC 3.47* 3.26* 3.61*  HGB 8.8* 8.3* 9.2*  HCT 25.3* 24.3* 26.2*  MCV 72.9* 74.5* 72.6*  MCH 25.4* 25.5* 25.5*  MCHC 34.8 34.2 35.1  RDW 17.6* 17.5* 17.6*  PLT 364 352 387    BNP Recent Labs  Lab 08/07/22 2129  BNP 2,047.2*     DDimer No results for input(s): "DDIMER" in the last 168 hours.   Radiology    US RENAL  Result Date: 08/11/2022 CLINICAL DATA:  Acute kidney injury. EXAM: RENAL / URINARY TRACT ULTRASOUND COMPLETE COMPARISON:  Renal ultrasound 05/22/2022 FINDINGS: Right Kidney: Renal measurements: 9.0 x 5.1 x 5.3 cm = volume: 128 mL. Mildly increased parenchymal echogenicity. No mass or hydronephrosis visualized. Left Kidney: Renal measurements: 10.4 x 6.0 x 5.7 cm = volume: 187 mL. Mildly increased  parenchymal echogenicity. No mass or hydronephrosis visualized. Bladder: Appears normal for degree of bladder distention. Other: None. IMPRESSION: Echogenic kidneys compatible with medical renal disease. No hydronephrosis. Electronically Signed   By: Sebastian Ache M.D.   On: 08/11/2022 15:27    Cardiac Studies   Cardiac Studies & Procedures       ECHOCARDIOGRAM  ECHOCARDIOGRAM COMPLETE 03/21/2022  Narrative ECHOCARDIOGRAM REPORT    Patient Name:   Shirley [name] Burgen Date of Exam: 03/21/2022 Medical Rec #:  [id number]        Height:       67.0 in Accession #:    [id number]       Weight:       250.0 lb Date of Birth:  [birth year]        BSA:          2.223 m Patient Age:    47 years         BP:           203/109 mmHg Patient Gender: F                HR:           122 bpm. Exam Location:  Inpatient  Procedure: 2D Echo, Cardiac Doppler and Color Doppler  Indications:    chest pain  History:        Patient has no prior history of Echocardiogram examinations. Risk Factors:Hypertension and Dyslipidemia.  Sonographer:    Melissa Morford RDCS (AE, PE) Referring Phys: [id number] VASUNDHRA RATHORE  IMPRESSIONS   1. Left ventricular ejection fraction, by estimation, is 45 to 50%. The left ventricle has mildly decreased function. The left ventricle demonstrates global hypokinesis. There is mild concentric left ventricular hypertrophy. Indeterminate diastolic filling due to E-A fusion. 2. Right ventricular systolic function is normal. The right ventricular size is normal. There is mildly elevated pulmonary artery systolic pressure. The estimated right ventricular systolic pressure is 35.9 mmHg. 3. Left atrial size was moderately dilated. 4. A small pericardial effusion is present. The pericardial effusion is circumferential. 5. The mitral valve is normal in structure. Mild to moderate mitral valve regurgitation. 6. Tricuspid valve regurgitation is mild to moderate. 7. The aortic valve is  tricuspid. Aortic valve regurgitation is not visualized. No aortic stenosis is present.  FINDINGS Left Ventricle: Left ventricular ejection fraction, by estimation, is 45 to 50%. The left ventricle has mildly decreased function. The left ventricle demonstrates global hypokinesis. The left ventricular internal cavity size was normal in size. There is  mild concentric left ventricular hypertrophy. Indeterminate diastolic filling due to E-A fusion.  Right Ventricle: The right ventricular size is normal. No increase in right ventricular wall thickness. Right ventricular systolic function is normal. There is mildly elevated pulmonary artery systolic pressure. The tricuspid regurgitant velocity is 2.87 m/s, and with an assumed right atrial pressure of 3 mmHg, the estimated right ventricular systolic pressure is 35.9 mmHg.  Left Atrium: Left atrial size was moderately dilated.  Right Atrium: Right atrial size was normal in size.  Pericardium: A small pericardial effusion is present. The pericardial effusion is circumferential.  Mitral Valve: The mitral valve is normal in structure. Mild to moderate mitral valve regurgitation, with centrally-directed jet.  Tricuspid Valve: The tricuspid valve is normal in structure. Tricuspid valve regurgitation is mild to moderate.  Aortic Valve: The aortic valve is tricuspid. Aortic valve regurgitation is not visualized. No aortic stenosis is present.  Pulmonic Valve: The pulmonic valve was grossly normal. Pulmonic valve regurgitation is not visualized.  Aorta: The aortic root and ascending aorta are structurally normal, with no evidence of dilitation.  IAS/Shunts: No atrial level shunt detected by color flow Doppler.   LEFT VENTRICLE PLAX 2D LVIDd:         5.20 cm LVIDs:         4.20 cm LV PW:         1.30 cm LV IVS:        1.30 cm LVOT diam:     2.10 cm LV SV:         65 LV SV Index:   29 LVOT Area:     3.46 cm  LV Volumes (MOD) LV vol d, MOD A2C:  142.0 ml LV vol d, MOD A4C: 112.0 ml LV vol s, MOD A2C: 84.5 ml LV vol s, MOD A4C: 61.3 ml LV SV MOD A2C:     57.5 ml LV SV MOD A4C:     112.0 ml LV SV MOD BP:      53.9 ml  RIGHT VENTRICLE RV S prime:     15.10 cm/s  LEFT ATRIUM             Index        RIGHT ATRIUM           Index LA diam:        4.60 cm 2.07 cm/m   RA Area:     14.80 cm LA Vol (A2C):   50.8 ml 22.85 ml/m  RA Volume:   38.30 ml  17.23 ml/m LA Vol (A4C):   70.5 ml 31.71 ml/m LA Biplane Vol: 63.9 ml 28.74 ml/m AORTIC VALVE LVOT Vmax:   104.00 cm/s LVOT Vmean:  73.600 cm/s LVOT VTI:    0.188 m  AORTA Ao Root diam: 2.90 cm  TRICUSPID VALVE TR Peak grad:   32.9 mmHg TR Vmax:        287.00 cm/s  SHUNTS Systemic VTI:  0.19 m Systemic Diam: 2.10 cm  Mihai Croitoru MD Electronically signed by Thurmon Fair MD Signature Date/Time: 03/21/2022/8:22:51 AM    Final              Patient Profile     47 y.o. 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 Found to have CHF and hyperkalemia. Reported adherence with her medicines.  Assessment & Plan    Acute on chronic HFmrEF, cardiomyopathy - on Coreg 25 mg PO BID, Hydarlazine/Imdur for afterload reduction - increasing Imdur dose - complicated by AKI (improving) - Planned for RHC 08/14/22 (see prior notes) - NPO at midnight for procedure  CAD s/p inferior MI 2008 tx with PDA angioplasty, having persistent chest tightness SOB - off nitro drip; on Imdur  HTN - renal duplex neg for RAS    AKI on CKD stage IV - seeing nephrology,  creatinine is now improving  Acute on chronic anemia Recent  PE with hx of DVT - planned to get IV iron was on eliquis by TRH - on heparin for procedure; afterward can return to DOAC  H/o substance abuse - prior cocaine use last positive 2019-2021, UDS is negative   For questions or updates, please contact [facility] Please consult [email or web address] for contact info under Cardiology/STEMI.  Riley Lam, MD FASE [facility] Cardiologist [facility]  [address] [place], [address] [place], [place] [address] [phone]  8:00 AM

## 2022-08-13 NOTE — Progress Notes (Signed)
Mobility Specialist Progress Note    08/13/22 1607  Mobility  Activity Ambulated with assistance in hallway  Level of Assistance Contact guard assist, steadying assist  Assistive Device Other (Comment) (HHA)  Distance Ambulated (ft) 220 ft  Activity Response Tolerated well  Mobility Referral Yes  $Mobility charge 1 Mobility   Pt received in bed and agreeable. Had void on BSC. No complaints on walk. Returned to sitting EOB with call bell in reach.   [place] Mobility Specialist  Please Neurosurgeon or Rehab Office at [phone]

## 2022-08-13 NOTE — Progress Notes (Signed)
ANTICOAGULATION CONSULT NOTE   Pharmacy Consult for heparin Indication:  Hx DVT/PE and CVA  Allergies  Allergen Reactions   Morphine And Related Itching and Swelling   Zestril [Lisinopril] Nausea Only and Swelling    Patient Measurements: Height:  (170.2 cm) Weight: 100.1 kg (220 lb 9.6 oz) IBW/kg (Calculated) : 61.6 Heparin Dosing Weight: 83.8 kg  Vital Signs: Temp: 98.1 F (36.7 C) (04/21 0404) Temp Source: Oral (04/21 0404) BP: 153/87 (04/21 0604) Pulse Rate: 91 (04/21 0404)  Labs: Recent Labs    08/10/22 1054 08/11/22 0129 08/11/22 0129 08/12/22 0132 08/13/22 0617 08/13/22 0630  HGB  --  8.8*   < > 8.3* 9.2*  --   HCT  --  25.3*  --  24.3* 26.2*  --   PLT  --  364  --  352 387  --   APTT  --   --   --   --   --  56*  HEPARINUNFRC  --   --   --   --   --  >1.10*  CREATININE  --  3.80*  --  3.90* 3.54*  --   TROPONINIHS 31*  --   --   --   --   --    < > = values in this interval not displayed.     Estimated Creatinine Clearance: 23.6 mL/min (A) (by C-G formula based on SCr of 3.54 mg/dL (H)).   Medical History: Past Medical History:  Diagnosis Date   Abscess of tunica vaginalis    10/09- Abundant S. aureus- sensitive to all abx   Anxiety    Blood dyscrasia    CAD (coronary artery disease) 06/15/2006   s/p Subendocardial MI with PDA angioplasty(no stent) on 06/15/06 and relook  cath 06/19/06 showed patency of site. Cath 12/10- no restenosis or significant CAD progression   CVA (cerebral vascular accident) ~ 02/2014   denies residual on 04/22/2014   CVA (cerebral vascular accident)    history of remote right cerebellar infarct noted on head CT at least since 10/2011   Depression    Diabetes mellitus type 2, uncontrolled, with complications    Fibromyalgia    Gastritis    Gastroparesis    secondary to poorly controlled DM, last emptying study performed 01/2010  was normal but may be falsely positive as pt was on reglan   GERD (gastroesophageal  reflux disease)    Hepatitis B, chronic    Hep BeAb+,Hep B cAb+ & Hep BsAg+ (9/06)   History of pyelonephritis    H/o GrpB Pyelonephritis (9/06) and UTI- 07/11- E.Coli, 12/10- GBS   Hyperlipidemia    Hypertension    Iron deficiency anemia    Irregular menses    Small ovarian follicles seen on CT(9/06)   MI (myocardial infarction) 05/2006   PDA percutaneous transluminal coronary angioplasty   Migraine    "weekly" (04/22/2014)   N&V (nausea and vomiting)    Chronic. Unclear etiology with multiple admission and ED visits. CT abdomen with and without contrast (02/2011)  showed no acute process. Gastic Emptying scan (01/2010) was normal. Ultrasound of the abdomen was within normal limits. Hepatitis B viral load was undectable. HIV NR. EGD - gastritis, Hpylori + s/p Rx   New onset of congestive heart failure 05/16/2022   Obesity    OSA (obstructive sleep apnea)    "suppose to wear mask but I don't" (04/22/2014)   Peripheral neuropathy    Pneumonia    "this is probably the  2nd or 3rd time I've had pneumonia" (04/22/2014)   Recurrent boils    Seasonal asthma    Substance abuse    Thrombocytosis    Hem/Onc suggested 2/2 chronic hepatits and/or iron deficiency anemia   Assessment: 1 YOF with hx DVT/ PE and remote CVA, on Eliquis PTA. Pharmacy consulted to dose IV heparin while Eliquis is on hold for planned right heart catheterization on 4/22. Last dose Eliquis 10:06 on 4/20. Will start IV heparin when next dose would be due at 22:00.   aPTT at is 56 subtherapeutic. Heparin level >1.10 as expected given recent Eliquis. Hgb 9.2 low stable, PLT 387 wnl. No issues with heparin infusion or s/sx bleeding per RN.   Goal of Therapy:  Heparin level 0.3-0.7 units/ml aPTT 66-102 seconds Monitor platelets by anticoagulation protocol: Yes   Plan:  Increase heparin to 1450 units/hr  Check 8 hour aPTT Daily heparin level and aPTT until correlates Monitor for s/sx bleeding F/u ability to  transition back to Eliquis   Larena Sox, PharmD PGY1 Pharmacy Resident   08/13/2022  7:43 AM

## 2022-08-13 NOTE — H&P (View-Only) (Signed)
Progress Note  Patient Name: Shirley [name] Date of Encounter: 08/13/2022  Primary Cardiologist: Philip Nahser, MD  Subjective   Feels slightly better this AM. Fatigued.  Didn't get much sleep last night.  Inpatient Medications    Scheduled Meds:  amLODipine  10 mg Oral Daily   atorvastatin  40 mg Oral Daily   carvedilol  25 mg Oral BID WC   famotidine  20 mg Oral QPM   ferrous sulfate  325 mg Oral Q breakfast   hydrALAZINE  100 mg Oral Q8H   insulin aspart  0-6 Units Subcutaneous TID WC   isosorbide mononitrate  60 mg Oral Daily   mupirocin ointment  1 Application Nasal BID   ondansetron (ZOFRAN) IV  4 mg Intravenous Once   pantoprazole  40 mg Oral Daily   sodium chloride flush  3 mL Intravenous Q12H   Continuous Infusions:  sodium chloride     ferric gluconate (FERRLECIT) IVPB 250 mg (08/12/22 1906)   heparin 1,450 Units/hr (08/13/22 0751)   PRN Meds: sodium chloride, acetaminophen, linaclotide, meclizine, nitroGLYCERIN, oxyCODONE-acetaminophen **AND** oxyCODONE, senna, sodium chloride flush   Vital Signs    Vitals:   08/13/22 0019 08/13/22 0404 08/13/22 0445 08/13/22 0604  BP: 133/75 135/83  (!) 153/87  Pulse:  91    Resp:  15  17  Temp: 98.1 F (36.7 C) 98.1 F (36.7 C)    TempSrc: Oral Oral    SpO2: 98% 98%    Weight:   100.1 kg   Height:        Intake/Output Summary (Last 24 hours) at 08/13/2022 0800 Last data filed at 08/13/2022 0446 Gross per 24 hour  Intake 1697.4 ml  Output 1700 ml  Net -2.6 ml      08/13/2022    4:45 AM 08/12/2022    3:42 AM 08/11/2022    7:01 AM  Last 3 Weights  Weight (lbs) 220 lb 9.6 oz 219 lb 9.3 oz 220 lb 7.4 oz  Weight (kg) 100.064 kg 99.6 kg 100 kg     Telemetry    SR with rare PACs; NSVT and VTACH are artifact- Personally Reviewed   Physical Exam   Physical Exam: Blood pressure (!) 153/87, pulse 91, temperature 98.1 F (36.7 C), temperature source Oral, resp. rate 17, height 5' 7" (1.702 m), weight 100.1  kg, SpO2 98 %.  Gen: no distress   Neck: Thick neck, unable to assess JVD Cardiac: No Rubs or Gallops, systolic murmur, RRR +2radial pulses Respiratory: Decreased breath sounds in the bases normal effort, normal  respiratory rate GI: Soft, nontender, non-distended  MS:  non pitting edema;  moves all extremities Integument: Skin feels warm Neuro:  At time of evaluation, alert and oriented to person/place/time/situation  Psych: Normal affect, patient feels ok    Labs    High Sensitivity Troponin:   Recent Labs  Lab 08/07/22 2129 08/07/22 2315 08/09/22 1019 08/09/22 1148 08/10/22 1054  TROPONINIHS 31* 32* 30* 28* 31*      Cardiac EnzymesNo results for input(s): "TROPONINI" in the last 168 hours. No results for input(s): "TROPIPOC" in the last 168 hours.   Chemistry Recent Labs  Lab 08/11/22 0129 08/12/22 0132 08/13/22 0617  NA 136 134* 136  K 3.7 3.8 3.7  CL 106 104 105  CO2 24 24 24  GLUCOSE 201* 190* 162*  BUN 41* 46* 46*  CREATININE 3.80* 3.90* 3.54*  CALCIUM 8.2* 8.1* 8.4*  ALBUMIN  --  2.2*  --     GFRNONAA 14* 14* 15*  ANIONGAP 6 6 7     Hematology Recent Labs  Lab 08/11/22 0129 08/12/22 0132 08/13/22 0617  WBC 11.2* 10.3 9.8  RBC 3.47* 3.26* 3.61*  HGB 8.8* 8.3* 9.2*  HCT 25.3* 24.3* 26.2*  MCV 72.9* 74.5* 72.6*  MCH 25.4* 25.5* 25.5*  MCHC 34.8 34.2 35.1  RDW 17.6* 17.5* 17.6*  PLT 364 352 387    BNP Recent Labs  Lab 08/07/22 2129  BNP 2,047.2*     DDimer No results for input(s): "DDIMER" in the last 168 hours.   Radiology    US RENAL  Result Date: 08/11/2022 CLINICAL DATA:  Acute kidney injury. EXAM: RENAL / URINARY TRACT ULTRASOUND COMPLETE COMPARISON:  Renal ultrasound 05/22/2022 FINDINGS: Right Kidney: Renal measurements: 9.0 x 5.1 x 5.3 cm = volume: 128 mL. Mildly increased parenchymal echogenicity. No mass or hydronephrosis visualized. Left Kidney: Renal measurements: 10.4 x 6.0 x 5.7 cm = volume: 187 mL. Mildly increased  parenchymal echogenicity. No mass or hydronephrosis visualized. Bladder: Appears normal for degree of bladder distention. Other: None. IMPRESSION: Echogenic kidneys compatible with medical renal disease. No hydronephrosis. Electronically Signed   By: Allen  Grady M.D.   On: 08/11/2022 15:27    Cardiac Studies   Cardiac Studies & Procedures       ECHOCARDIOGRAM  ECHOCARDIOGRAM COMPLETE 03/21/2022  Narrative ECHOCARDIOGRAM REPORT    Patient Name:   Shirley [name] Date of Exam: 03/21/2022 Medical Rec #:  [id number]        Height:       67.0 in Accession #:    [id number]       Weight:       250.0 lb Date of Birth:  08/25/1975        BSA:          2.223 m Patient Age:    47 years         BP:           203/109 mmHg Patient Gender: F                HR:           122 bpm. Exam Location:  Inpatient  Procedure: 2D Echo, Cardiac Doppler and Color Doppler  Indications:    chest pain  History:        Patient has no prior history of Echocardiogram examinations. Risk Factors:Hypertension and Dyslipidemia.  Sonographer:    Melissa Morford RDCS (AE, PE) Referring Phys: [id number] VASUNDHRA RATHORE  IMPRESSIONS   1. Left ventricular ejection fraction, by estimation, is 45 to 50%. The left ventricle has mildly decreased function. The left ventricle demonstrates global hypokinesis. There is mild concentric left ventricular hypertrophy. Indeterminate diastolic filling due to E-A fusion. 2. Right ventricular systolic function is normal. The right ventricular size is normal. There is mildly elevated pulmonary artery systolic pressure. The estimated right ventricular systolic pressure is 35.9 mmHg. 3. Left atrial size was moderately dilated. 4. A small pericardial effusion is present. The pericardial effusion is circumferential. 5. The mitral valve is normal in structure. Mild to moderate mitral valve regurgitation. 6. Tricuspid valve regurgitation is mild to moderate. 7. The aortic valve is  tricuspid. Aortic valve regurgitation is not visualized. No aortic stenosis is present.  FINDINGS Left Ventricle: Left ventricular ejection fraction, by estimation, is 45 to 50%. The left ventricle has mildly decreased function. The left ventricle demonstrates global hypokinesis. The left ventricular internal cavity size was normal in size. There is   mild concentric left ventricular hypertrophy. Indeterminate diastolic filling due to E-A fusion.  Right Ventricle: The right ventricular size is normal. No increase in right ventricular wall thickness. Right ventricular systolic function is normal. There is mildly elevated pulmonary artery systolic pressure. The tricuspid regurgitant velocity is 2.87 m/s, and with an assumed right atrial pressure of 3 mmHg, the estimated right ventricular systolic pressure is 35.9 mmHg.  Left Atrium: Left atrial size was moderately dilated.  Right Atrium: Right atrial size was normal in size.  Pericardium: A small pericardial effusion is present. The pericardial effusion is circumferential.  Mitral Valve: The mitral valve is normal in structure. Mild to moderate mitral valve regurgitation, with centrally-directed jet.  Tricuspid Valve: The tricuspid valve is normal in structure. Tricuspid valve regurgitation is mild to moderate.  Aortic Valve: The aortic valve is tricuspid. Aortic valve regurgitation is not visualized. No aortic stenosis is present.  Pulmonic Valve: The pulmonic valve was grossly normal. Pulmonic valve regurgitation is not visualized.  Aorta: The aortic root and ascending aorta are structurally normal, with no evidence of dilitation.  IAS/Shunts: No atrial level shunt detected by color flow Doppler.   LEFT VENTRICLE PLAX 2D LVIDd:         5.20 cm LVIDs:         4.20 cm LV PW:         1.30 cm LV IVS:        1.30 cm LVOT diam:     2.10 cm LV SV:         65 LV SV Index:   29 LVOT Area:     3.46 cm  LV Volumes (MOD) LV vol d, MOD A2C:  142.0 ml LV vol d, MOD A4C: 112.0 ml LV vol s, MOD A2C: 84.5 ml LV vol s, MOD A4C: 61.3 ml LV SV MOD A2C:     57.5 ml LV SV MOD A4C:     112.0 ml LV SV MOD BP:      53.9 ml  RIGHT VENTRICLE RV S prime:     15.10 cm/s  LEFT ATRIUM             Index        RIGHT ATRIUM           Index LA diam:        4.60 cm 2.07 cm/m   RA Area:     14.80 cm LA Vol (A2C):   50.8 ml 22.85 ml/m  RA Volume:   38.30 ml  17.23 ml/m LA Vol (A4C):   70.5 ml 31.71 ml/m LA Biplane Vol: 63.9 ml 28.74 ml/m AORTIC VALVE LVOT Vmax:   104.00 cm/s LVOT Vmean:  73.600 cm/s LVOT VTI:    0.188 m  AORTA Ao Root diam: 2.90 cm  TRICUSPID VALVE TR Peak grad:   32.9 mmHg TR Vmax:        287.00 cm/s  SHUNTS Systemic VTI:  0.19 m Systemic Diam: 2.10 cm  Mihai Croitoru MD Electronically signed by Mihai Croitoru MD Signature Date/Time: 03/21/2022/8:22:51 AM    Final              Patient Profile     46 y.o. 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 Found to have CHF and hyperkalemia. Reported adherence with her medicines.  Assessment & Plan    Acute on chronic HFmrEF, cardiomyopathy - on Coreg 25 mg PO BID, Hydarlazine/Imdur for afterload reduction - increasing Imdur dose - complicated by AKI (improving) - Planned for RHC 08/14/22 (see prior notes) - NPO at midnight for procedure  CAD s/p inferior MI 2008 tx with PDA angioplasty, having persistent chest tightness SOB - off nitro drip; on Imdur  HTN - renal duplex neg for RAS    AKI on CKD stage IV - seeing nephrology,  creatinine is now improving  Acute on chronic anemia Recent  PE with hx of DVT - planned to get IV iron was on eliquis by TRH - on heparin for procedure; afterward can return to DOAC  H/o substance abuse - prior cocaine use last positive 2019-2021, UDS is negative   For questions or updates, please contact [facility] Please consult [email or web address] for contact info under Cardiology/STEMI.  Rodnesha Elie, MD FASE FACC Cardiologist [place]  [facility]  [address], [address] [place], [place] [address] [phone]  8:00 AM  

## 2022-08-13 NOTE — Progress Notes (Addendum)
ANTICOAGULATION CONSULT NOTE   Pharmacy Consult for heparin Indication:  Hx DVT/PE and CVA  Allergies  Allergen Reactions   Morphine And Related Itching and Swelling   Zestril [Lisinopril] Nausea Only and Swelling    Patient Measurements: Height:  (170.2 cm) Weight: 100.1 kg (220 lb 9.6 oz) IBW/kg (Calculated) : 61.6 Heparin Dosing Weight: 83.8 kg  Vital Signs: Temp: 98.1 F (36.7 C) (04/21 1442) Temp Source: Oral (04/21 1442) BP: 124/77 (04/21 1442) Pulse Rate: 84 (04/21 1442)  Labs: Recent Labs    08/11/22 0129 08/12/22 0132 08/13/22 0617 08/13/22 0630 08/13/22 1552  HGB 8.8* 8.3* 9.2*  --   --   HCT 25.3* 24.3* 26.2*  --   --   PLT 364 352 387  --   --   APTT  --   --   --  56* 82*  HEPARINUNFRC  --   --   --  >1.10*  --   CREATININE 3.80* 3.90* 3.54*  --   --      Estimated Creatinine Clearance: 23.6 mL/min (A) (by C-G formula based on SCr of 3.54 mg/dL (H)).   Assessment: 19 YOF with hx DVT/ PE and remote CVA, on Eliquis PTA. Pharmacy consulted to dose IV heparin while Eliquis is on hold for planned right heart catheterization on 4/22. Last dose Eliquis 10:06 on 4/20. Will start IV heparin when next dose would be due at 22:00.   aPTT 82 sec (therapeutic) on infusion at 1450 units/hr. No bleeding noted.  Goal of Therapy:  Heparin level 0.3-0.7 units/ml aPTT 66-102 seconds Monitor platelets by anticoagulation protocol: Yes   Plan:  Continue heparin at 1450 units/hr  F/u 6h confirmatory aPTT  Christoper Fabian, PharmD, BCPS Please see amion for complete clinical pharmacist phone list 08/13/2022  5:01 PM   Addendum (2255) aPTT back at 34 sec - Pt with leaking IV so heparin off from 2045-2200. Plan: Continue heparin at 1450 units/hr. F/u 8hr aPTT  Christoper Fabian, PharmD, BCPS Please see amion for complete clinical pharmacist phone list 08/13/2022 10:57 PM

## 2022-08-13 NOTE — Progress Notes (Signed)
[facility] Progress Note    Assessment/ Plan:   AKI on CKD4 -Baseline creatinine seems to be around 2.6, followed at [facility] -nonoliguric. Possibly ongoing CRS +/- med effect. Stopped farxiga. No obstruction on u/s. Urine sediment okay -Cr improved to 3.5. Diuretics on hold as of 4/20. Diuretic plan pending RHC -no indication for renal replacement therapy as of today -Avoid nephrotoxic medications including NSAIDs and iodinated intravenous contrast exposure unless the latter is absolutely indicated.  Preferred narcotic agents for pain control are hydromorphone, fentanyl, and methadone. Morphine should not be used. Avoid Baclofen and avoid oral sodium phosphate and magnesium citrate based laxatives / bowel preps. Continue strict Input and Output monitoring. Will monitor the patient closely with you and intervene or adjust therapy as indicated by changes in clinical status/labs    Acute on chronic combined HFrEF exacerbation, chronic cor pulmonale -Would recommend RHC-planned for tomorrow -Cardiology following -Daily labs, strict I's and O's, daily weights -Net negative around 5.1 L since admit -continue to hold on diuretics for today until data from RHC   Hypertension -BP acceptable   Hyperkalemia -Resolved   Anemia -iron deplete, receiving IV iron load, on PO Fe -Transfuse as needed for hemoglobin less than 7   DM2 with hyperglycemia -Management per primary service   History of DVT/PE -On Eliquis  Subjective:   No acute events. No new complaints. She does report some slight improvement in swelling. Uop 1.7L   Objective:   BP (!) 153/87   Pulse 91   Temp 98.1 F (36.7 C) (Oral)   Resp 17   Ht  (1.702 m)   Wt 100.1 kg   SpO2 98%   BMI 34.55 kg/m   Intake/Output Summary (Last 24 hours) at 08/13/2022 1122 Last data filed at 08/13/2022 0700 Gross per 24 hour  Intake 1747.99 ml  Output 1200 ml  Net 547.99 ml   Weight change: 0.064 kg  Physical  Exam: Gen: NAD, laying flat on RA CVS: RRR Resp: CTA b/l, normal WOB Abd: soft, nt/nd Ext: trace edema b/l Les Neuro: awake, alert  Imaging: US RENAL  Result Date: 08/11/2022 CLINICAL DATA:  Acute kidney injury. EXAM: RENAL / URINARY TRACT ULTRASOUND COMPLETE COMPARISON:  Renal ultrasound 05/22/2022 FINDINGS: Right Kidney: Renal measurements: 9.0 x 5.1 x 5.3 cm = volume: 128 mL. Mildly increased parenchymal echogenicity. No mass or hydronephrosis visualized. Left Kidney: Renal measurements: 10.4 x 6.0 x 5.7 cm = volume: 187 mL. Mildly increased parenchymal echogenicity. No mass or hydronephrosis visualized. Bladder: Appears normal for degree of bladder distention. Other: None. IMPRESSION: Echogenic kidneys compatible with medical renal disease. No hydronephrosis. Electronically Signed   By: Sebastian Ache M.D.   On: 08/11/2022 15:27    Labs: BMET Recent Labs  Lab 08/07/22 2129 08/08/22 0550 08/09/22 0550 08/10/22 0058 08/11/22 0129 08/12/22 0132 08/13/22 0617  NA 140 141 137 137 136 134* 136  K 5.7* 3.7 4.0 3.9 3.7 3.8 3.7  CL 111 109 108 107 106 104 105  CO2 19* 19* 21* GLUCOSE 92 84 125* 142* 201* 190* 162*  BUN 45* 43* 43* 42* 41* 46* 46*  CREATININE 2.78* 2.86* 3.41* 3.50* 3.80* 3.90* 3.54*  CALCIUM 8.7* 8.3* 8.1* 8.3* 8.2* 8.1* 8.4*  PHOS  --   --   --   --   --  4.4  --    CBC Recent Labs  Lab 08/10/22 0058 08/11/22 0129 08/12/22 0132 08/13/22 0617  WBC 12.7* 11.2*  10.3 9.8  HGB 8.3* 8.8* 8.3* 9.2*  HCT 25.0* 25.3* 24.3* 26.2*  MCV 74.9* 72.9* 74.5* 72.6*  PLT 383 364 352 387    Medications:     amLODipine  10 mg Oral Daily   atorvastatin  40 mg Oral Daily   carvedilol  25 mg Oral BID WC   famotidine  20 mg Oral QPM   ferrous sulfate  325 mg Oral Q breakfast   hydrALAZINE  100 mg Oral Q8H   insulin aspart  0-6 Units Subcutaneous TID WC   [START ON 08/14/2022] isosorbide mononitrate  90 mg Oral Daily   mupirocin ointment  1 Application Nasal  BID   ondansetron (ZOFRAN) IV  4 mg Intravenous Once   pantoprazole  40 mg Oral Daily   sodium chloride flush  3 mL Intravenous Q12H      Anthony Sar, MD [facility] 08/13/2022, 11:22 AM

## 2022-08-13 NOTE — Progress Notes (Signed)
PROGRESS NOTE Shirley [name]  WGN:[id number] DOB: 11-26-1975 DOA: 08/07/2022 PCP: Bettey Costa, NP  Brief Narrative/Hospital Course: 47 y.o.f w/ Chronic HFrEF (45-50%, PAH), CKD 4, CAD s/p PCI, stroke, DM2, OSA, h/o DVT/PE on eliquis presented to the ED with complaint of dyspnea orthopnea and bilateral lower leg swelling and about 20 pound weight gain over past 2 days In E C: BP 190-200 systolic on arrival.  Labs with BNP elevated 2047, troponin 31> 32 flat, hyperkalemia 5.7, acidosis bicarb 19, creatinine 2.7 BUN 45 hemoglobin 9.4 platelet 412 pregnancy test negative. Chest x-ray cardiomegaly and vascular congestion with small pleural effusion consistent with CHF. Patient was Given Lasix 80, BP meds, Lokelma and NTG drip and admitted for acute on chronic systolic CHF and severe hypertension.    Subjective: Seen examined this morning.  She reports she feels some better today  Did not get much sleep last night, appears fatigued No new complaints Overnight BP 120s to 130s mostly this morning in 150s, on room air Labs showed slightly better creatinine 3.5 hemoglobin up at 9.2 Weight 229  lb> down to 219> 220  lb  Assessment and Plan: Principal Problem:   Acute on chronic systolic CHF (congestive heart failure) Active Problems:   Severe hypertension   CKD (chronic kidney disease) stage 4, GFR 15-29 ml/min   CAD S/P percutaneous coronary angioplasty   Type 2 diabetes mellitus with hyperlipidemia   History of pulmonary embolism   Chronic pain   Acute on chronic combined systolic and diastolic CHF (congestive heart failure)   Acute on chronic systolic CHF-also suspected diastolic dysfunction with her uncontrolled hypertension Cardiomyopathy Severe hypertension Chronic cor pulmonale: Patient presentation of dyspnea, leg edema and abnormal labs/imaging consistent with acute exacerbation of chronic systolics secondary to uncontrolled hypertension Cardiology following closely.   Off  NTG  drip 4/20.Last EF 45-50%-unable to determine diastolic dysfunction although suspecting diastolic dysfunction. >Nephrology has been consulted due to worsening creatinine> Lasix remains on hold Continue diuresis and GDMT per cardiology- continue amlodipine, Coreg, hydralazine, Imdur. Plan for right heart cath for better volume assessment and Cardiomyopathy evaluation Cont to monitor daily I/O, daly weight ( changes as beow), monitor electrolytes and net balance as below. Keep on 2 gm salt and fluid restricted diet. Net IO Since Admission: -5,121.96 mL [08/13/22 1115]  Filed Weights   08/11/22 0701 08/12/22 0342 08/13/22 0445  Weight: 100 kg 99.6 kg 100.1 kg    Recent Labs  Lab 08/07/22 2129 08/08/22 0550 08/09/22 0550 08/10/22 0058 08/11/22 0129 08/12/22 0132 08/13/22 0617  BNP 2,047.2*  --   --   --   --   --   --   BUN 45*   < > 43* 42* 41* 46* 46*  CREATININE 2.78*   < > 3.41* 3.50* 3.80* 3.90* 3.54*  K 5.7*   < > 4.0 3.9 3.7 3.8 3.7   < > = values in this interval not displayed.    Chest pain: w/ DOE-in the setting of above. Continue plan as above on antihypertensives.   AKI on CKD stage IV: baseline CREAT ~2.6 recently has been up to 3.3 in January. Creatinine uptrending-nephrology input appreciated, nephrology recommends RHC at this point for better volume assessment, if renal function worsens  she may need to consider renal replacement therapy down the road Creatinine slightly better Lasix on hold. >Avoid nephrotoxic medications including NSAIDs and iodinated intravenous contrast exposure unless the latter is absolutely indicated.Preferred narcotic agents for pain control HQI:[id number], fentanyl, and methadone  and avoid morphine.Avoid Baclofen and avoid oral sodium phosphate and magnesium citrate based laxatives / bowel preps. Continue strict Input and Output monitoring and serial renal functions.  Recent Labs    05/22/22 0043 05/23/22 0123 05/31/22 1528 08/07/22 2129  08/08/22 0550 08/09/22 0550 08/10/22 0058 08/11/22 0129 08/12/22 0132 08/13/22 0617  BUN 45* 48* 37* 45* 43* 43* 42* 41* 46* 46*  CREATININE 3.41* 3.39* 2.64* 2.78* 2.86* 3.41* 3.50* 3.80* 3.90* 3.54*    Hyperkalemia resolved with Lokelma  Iron deficiency anemia Anemia chronic kidney disease: Anemia  panel w/ s iron low at 22 ferritin normal, getting IV iron.Albumin monitor. Recent Labs    08/09/22 0550 08/10/22 0058 08/11/22 0129 08/12/22 0132 08/13/22 0617  HGB 8.5* 8.3* 8.8* 8.3* 9.2*  MCV 72.2* 74.9* 72.9* 74.5* 72.6*  FERRITIN  --   --   --  28  --   TIBC  --   --   --  252  --   IRON  --   --   --  22*  --     Hyperlipidemia:continue statin.  CAD with history of percutaneous coronary angioplasty Mildly elevated troponin: Troponins are flat likely demand ischemia from hypertension. Cont aspirin/beta-blocker.   T2DM on Farxiga: Currently stable at times poorly at 219. Cont SSI. holding, farxiga, monitor. Recent Labs  Lab 08/12/22 0613 08/12/22 1154 08/12/22 1627 08/12/22 2109 08/13/22 0540  GLUCAP 155* 147* 219* 198* 156*  History of PE on Eliquis>changed to heparin 4/20 for RHC  Chronic pain on Percocet  History of CVA Class I Obesity with untreated [id number] Body mass index is 34.55 kg/m. : Will benefit with PCP follow-up, weight loss  healthy lifestyle.  Refusing CPAP here  DVT prophylaxis: Place and maintain sequential compression device Start: 08/09/22 1404eliquis Code Status:   Code Status: Full Code Family Communication: plan of care discussed with patient at bedside. Patient status is: Inpatient because of CHF AKI Level of care: Progressive   Dispo: The patient is from: home            Anticipated disposition: TBD  Objective: Vitals last 24 hrs: Vitals:   08/13/22 0019 08/13/22 0404 08/13/22 0445 08/13/22 0604  BP: 133/75 135/83  (!) 153/87  Pulse:  91    Resp:  15  17  Temp: 98.1 F (36.7 C) 98.1 F (36.7 C)    TempSrc: Oral Oral     SpO2: 98% 98%    Weight:   100.1 kg   Height:       Weight change: 0.064 kg  Physical Examination: General exam: AA,obese,weak,older appearing HEENT:Oral mucosa moist, Ear/Nose WNL grossly, dentition normal. Respiratory system: bilaterally diminished BS, no use of accessory muscle Cardiovascular system: S1 & S2 +, regular rate,. Gastrointestinal system: Abdomen soft, NT,ND,BS+ Nervous System:Alert, awake, moving extremities and grossly nonfocal Extremities: LE ankle edema +rt ankle >left , lower extremities warm Skin: No rashes,no icterus. MSK: Normal muscle bulk,tone, power   Medications reviewed:  Scheduled Meds:  amLODipine  10 mg Oral Daily   atorvastatin  40 mg Oral Daily   carvedilol  25 mg Oral BID WC   famotidine  20 mg Oral QPM   ferrous sulfate  325 mg Oral Q breakfast   hydrALAZINE  100 mg Oral Q8H   insulin aspart  0-6 Units Subcutaneous TID WC   [START ON 08/14/2022] isosorbide mononitrate  90 mg Oral Daily   mupirocin ointment  1 Application Nasal BID   ondansetron (ZOFRAN) IV  4 mg Intravenous  Once   pantoprazole  40 mg Oral Daily   sodium chloride flush  3 mL Intravenous Q12H   Continuous Infusions:  sodium chloride     ferric gluconate (FERRLECIT) IVPB 250 mg (08/13/22 1610)   heparin 1,450 Units/hr (08/13/22 0751)    Diet Order             Diet NPO time specified  Diet effective midnight           Diet heart healthy/carb modified Room service appropriate? Yes; Fluid consistency: Thin; Fluid restriction: 1200 mL Fluid  Diet effective now                  Intake/Output Summary (Last 24 hours) at 08/13/2022 1115 Last data filed at 08/13/2022 0700 Gross per 24 hour  Intake 1747.99 ml  Output 1200 ml  Net 547.99 ml   Net IO Since Admission: -5,121.96 mL [08/13/22 1115]  Wt Readings from Last 3 Encounters:  08/13/22 100.1 kg  05/31/22 94.8 kg  05/23/22 99 kg     Unresulted Labs (From admission, onward)     Start     Ordered   08/14/22 0500   CBC  Daily,   R     Question:  Specimen collection method  Answer:  Lab=Lab collect   08/13/22 0752   08/14/22 0500  Heparin level (unfractionated)  Daily,   R     Question:  Specimen collection method  Answer:  Lab=Lab collect  See Hyperspace for full Linked Orders Report.   08/13/22 0752   08/14/22 0500  APTT  Daily,   R     Question:  Specimen collection method  Answer:  Lab=Lab collect  See Hyperspace for full Linked Orders Report.   08/13/22 0752   08/13/22 1600  APTT  Once-Timed,   TIMED       Question:  Specimen collection method  Answer:  Lab=Lab collect   08/13/22 0748   08/13/22 0500  Basic metabolic panel  Daily,   R     Question:  Specimen collection method  Answer:  Lab=Lab collect   08/12/22 1132   08/12/22 0500  Microalbumin / creatinine urine ratio  Tomorrow morning,   R        08/11/22 1553          Data Reviewed: I have personally reviewed following labs and imaging studies CBC: Recent Labs  Lab 08/09/22 0550 08/10/22 0058 08/11/22 0129 08/12/22 0132 08/13/22 0617  WBC 11.0* 12.7* 11.2* 10.3 9.8  HGB 8.5* 8.3* 8.8* 8.3* 9.2*  HCT 23.9* 25.0* 25.3* 24.3* 26.2*  MCV 72.2* 74.9* 72.9* 74.5* 72.6*  PLT 362 383 364 352 387   Basic Metabolic Panel: Recent Labs  Lab 08/09/22 0550 08/10/22 0058 08/11/22 0129 08/12/22 0132 08/13/22 0617  NA 137 137 136 134* 136  K 4.0 3.9 3.7 3.8 3.7  CL 108 107 106 104 105  CO2 21* GLUCOSE 125* 142* 201* 190* 162*  BUN 43* 42* 41* 46* 46*  CREATININE 3.41* 3.50* 3.80* 3.90* 3.54*  CALCIUM 8.1* 8.3* 8.2* 8.1* 8.4*  PHOS  --   --   --  4.4  --    Recent Labs  Lab 08/12/22 0613 08/12/22 1154 08/12/22 1627 08/12/22 2109 08/13/22 0540  GLUCAP 155* 147* 219* 198* 156*   Recent Results (from the past 240 hour(s))  MRSA Next Gen by PCR, Nasal     Status: Abnormal   Collection Time: 08/08/22  7:24  PM   Specimen: Nasal Mucosa; Nasal Swab  Result Value Ref Range Status   MRSA by PCR Next Gen DETECTED  (A) NOT DETECTED Final    Comment: RESULT CALLED TO, READ BACK BY AND VERIFIED WITH: L STEVENSON,RN@2152  08/07/20 MK (NOTE) The GeneXpert MRSA Assay (FDA approved for NASAL specimens only), is one component of a comprehensive MRSA colonization surveillance program. It is not intended to diagnose MRSA infection nor to guide or monitor treatment for MRSA infections. Test performance is not FDA approved in patients less than 32 years old. Performed at [facility], [address] [place], [place] [address]     Antimicrobials: Anti-infectives (From admission, onward)    None      Culture/Microbiology None Radiology Studies: US RENAL  Result Date: 08/11/2022 CLINICAL DATA:  Acute kidney injury. EXAM: RENAL / URINARY TRACT ULTRASOUND COMPLETE COMPARISON:  Renal ultrasound 05/22/2022 FINDINGS: Right Kidney: Renal measurements: 9.0 x 5.1 x 5.3 cm = volume: 128 mL. Mildly increased parenchymal echogenicity. No mass or hydronephrosis visualized. Left Kidney: Renal measurements: 10.4 x 6.0 x 5.7 cm = volume: 187 mL. Mildly increased parenchymal echogenicity. No mass or hydronephrosis visualized. Bladder: Appears normal for degree of bladder distention. Other: None. IMPRESSION: Echogenic kidneys compatible with medical renal disease. No hydronephrosis. Electronically Signed   By: Sebastian Ache M.D.   On: 08/11/2022 15:27     LOS: 5 days   Lanae Boast, MD [facility]  08/13/2022, 11:15 AM

## 2022-08-14 ENCOUNTER — Inpatient Hospital Stay (HOSPITAL_COMMUNITY)
Admission: EM | Disposition: A | Discharge status: Home / Self Care | Payer: Self-pay | Source: Home / Self Care | Attending: Internal Medicine

## 2022-08-14 ENCOUNTER — Encounter (HOSPITAL_COMMUNITY): Payer: Self-pay | Admitting: Cardiovascular Disease

## 2022-08-14 DIAGNOSIS — I5023 Acute on chronic systolic (congestive) heart failure: Secondary | ICD-10-CM | POA: Diagnosis not present

## 2022-08-14 DIAGNOSIS — D509 Iron deficiency anemia, unspecified: Secondary | ICD-10-CM | POA: Diagnosis not present

## 2022-08-14 HISTORY — PX: RIGHT HEART CATH: CATH118263

## 2022-08-14 LAB — POCT I-STAT EG7
Acid-Base Excess: 0 mmol/L (ref 0.0–2.0)
Acid-Base Excess: 0 mmol/L (ref 0.0–2.0)
Bicarbonate: 24.6 mmol/L (ref 20.0–28.0)
Bicarbonate: 24.7 mmol/L (ref 20.0–28.0)
Calcium, Ion: 1.25 mmol/L (ref 1.15–1.40)
Calcium, Ion: 1.26 mmol/L (ref 1.15–1.40)
HCT: 25 % — ABNORMAL LOW (ref 36.0–46.0)
HCT: 25 % — ABNORMAL LOW (ref 36.0–46.0)
Hemoglobin: 8.5 g/dL — ABNORMAL LOW (ref 12.0–15.0)
Hemoglobin: 8.5 g/dL — ABNORMAL LOW (ref 12.0–15.0)
O2 Saturation: 69 %
O2 Saturation: 70 %
Potassium: 3.9 mmol/L (ref 3.5–5.1)
Potassium: 3.9 mmol/L (ref 3.5–5.1)
Sodium: 139 mmol/L (ref 135–145)
Sodium: 139 mmol/L (ref 135–145)
TCO2: 26 mmol/L (ref 22–32)
TCO2: 26 mmol/L (ref 22–32)
pCO2, Ven: 40.9 mmHg — ABNORMAL LOW (ref 44–60)
pCO2, Ven: 41.1 mmHg — ABNORMAL LOW (ref 44–60)
pH, Ven: 7.386 (ref 7.25–7.43)
pH, Ven: 7.388 (ref 7.25–7.43)
pO2, Ven: 36 mmHg (ref 32–45)
pO2, Ven: 37 mmHg (ref 32–45)

## 2022-08-14 LAB — GLUCOSE, CAPILLARY
Glucose-Capillary: 119 mg/dL — ABNORMAL HIGH (ref 70–99)
Glucose-Capillary: 119 mg/dL — ABNORMAL HIGH (ref 70–99)
Glucose-Capillary: 212 mg/dL — ABNORMAL HIGH (ref 70–99)
Glucose-Capillary: 196 mg/dL — ABNORMAL HIGH (ref 70–99)
Glucose-Capillary: 166 mg/dL — ABNORMAL HIGH (ref 70–99)

## 2022-08-14 LAB — MICROALBUMIN / CREATININE URINE RATIO
Creatinine, Urine: 56.5 mg/dL
Microalb Creat Ratio: 2480 mg/g creat — ABNORMAL HIGH (ref 0–29)
Microalb, Ur: 1401.4 ug/mL — ABNORMAL HIGH

## 2022-08-14 LAB — CBC
HCT: 25.2 % — ABNORMAL LOW (ref 36.0–46.0)
Hemoglobin: 8.7 g/dL — ABNORMAL LOW (ref 12.0–15.0)
MCH: 25.4 pg — ABNORMAL LOW (ref 26.0–34.0)
MCHC: 34.5 g/dL (ref 30.0–36.0)
MCV: 73.7 fL — ABNORMAL LOW (ref 80.0–100.0)
Platelets: 381 10*3/uL (ref 150–400)
RBC: 3.42 MIL/uL — ABNORMAL LOW (ref 3.87–5.11)
RDW: 17.5 % — ABNORMAL HIGH (ref 11.5–15.5)
WBC: 10.6 10*3/uL — ABNORMAL HIGH (ref 4.0–10.5)
nRBC: 0 % (ref 0.0–0.2)

## 2022-08-14 LAB — BASIC METABOLIC PANEL
Anion gap: 9 (ref 5–15)
BUN: 49 mg/dL — ABNORMAL HIGH (ref 6–20)
CO2: 22 mmol/L (ref 22–32)
Calcium: 8.5 mg/dL — ABNORMAL LOW (ref 8.9–10.3)
Chloride: 104 mmol/L (ref 98–111)
Creatinine, Ser: 3.53 mg/dL — ABNORMAL HIGH (ref 0.44–1.00)
GFR, Estimated: 15 mL/min — ABNORMAL LOW (ref >60)
Glucose, Bld: 120 mg/dL — ABNORMAL HIGH (ref 70–99)
Potassium: 3.9 mmol/L (ref 3.5–5.1)
Sodium: 135 mmol/L (ref 135–145)

## 2022-08-14 LAB — HEPARIN LEVEL (UNFRACTIONATED): Heparin Unfractionated: 1.05 IU/mL — ABNORMAL HIGH (ref 0.30–0.70)

## 2022-08-14 LAB — APTT: aPTT: 90 seconds — ABNORMAL HIGH (ref 24–36)

## 2022-08-14 SURGERY — RIGHT HEART CATH
Anesthesia: LOCAL

## 2022-08-14 MED ORDER — FENTANYL CITRATE (PF) 100 MCG/2ML IJ SOLN
INTRAMUSCULAR | Status: AC
  Filled 2022-08-14: qty 2

## 2022-08-14 MED ORDER — APIXABAN 5 MG PO TABS
5.0000 mg | ORAL_TABLET | Freq: Two times a day (BID) | ORAL | Status: DC
  Administered 2022-08-14 – 2022-08-15 (×2): 5 mg via ORAL
  Filled 2022-08-14 (×2): qty 1

## 2022-08-14 MED ORDER — ONDANSETRON HCL 4 MG/2ML IJ SOLN: 4.0000 mg | Freq: Four times a day (QID) | INTRAMUSCULAR | Status: DC | PRN

## 2022-08-14 MED ORDER — LABETALOL HCL 5 MG/ML IV SOLN: 10.0000 mg | INTRAVENOUS | Status: AC | PRN

## 2022-08-14 MED ORDER — SODIUM CHLORIDE 0.9% FLUSH: 3.0000 mL | INTRAVENOUS | Status: DC | PRN

## 2022-08-14 MED ORDER — FENTANYL CITRATE (PF) 100 MCG/2ML IJ SOLN
INTRAMUSCULAR | Status: DC | PRN
  Administered 2022-08-14: 25 ug via INTRAVENOUS

## 2022-08-14 MED ORDER — ASPIRIN 81 MG PO CHEW
81.0000 mg | CHEWABLE_TABLET | Freq: Every day | ORAL | Status: DC
  Administered 2022-08-15: 81 mg via ORAL
  Filled 2022-08-14: qty 1

## 2022-08-14 MED ORDER — SODIUM CHLORIDE 0.9 % IV SOLN: INTRAVENOUS | Status: AC

## 2022-08-14 MED ORDER — LIDOCAINE HCL (PF) 1 % IJ SOLN
INTRAMUSCULAR | Status: AC
  Filled 2022-08-14: qty 30

## 2022-08-14 MED ORDER — FUROSEMIDE 40 MG PO TABS
60.0000 mg | ORAL_TABLET | Freq: Every day | ORAL | Status: DC
  Administered 2022-08-14 – 2022-08-15 (×2): 60 mg via ORAL
  Filled 2022-08-14 (×2): qty 1

## 2022-08-14 MED ORDER — SODIUM CHLORIDE 0.9% FLUSH
3.0000 mL | Freq: Two times a day (BID) | INTRAVENOUS | Status: DC
Start: 2022-08-14 — End: 2022-08-14

## 2022-08-14 MED ORDER — MIDAZOLAM HCL 2 MG/2ML IJ SOLN
INTRAMUSCULAR | Status: AC
  Filled 2022-08-14: qty 2

## 2022-08-14 MED ORDER — SODIUM CHLORIDE 0.9 % IV SOLN
250.0000 mL | INTRAVENOUS | Status: DC | PRN
Start: 2022-08-14 — End: 2022-08-14

## 2022-08-14 MED ORDER — DARBEPOETIN ALFA 40 MCG/0.4ML IJ SOSY
40.0000 ug | PREFILLED_SYRINGE | INTRAMUSCULAR | Status: DC
  Administered 2022-08-14: 40 ug via SUBCUTANEOUS
  Filled 2022-08-14: qty 0.4

## 2022-08-14 MED ORDER — MIDAZOLAM HCL 2 MG/2ML IJ SOLN
INTRAMUSCULAR | Status: DC | PRN
  Administered 2022-08-14: 1 mg via INTRAVENOUS

## 2022-08-14 MED ORDER — HEPARIN (PORCINE) IN NACL 1000-0.9 UT/500ML-% IV SOLN
INTRAVENOUS | Status: DC | PRN
  Administered 2022-08-14: 500 mL

## 2022-08-14 MED ORDER — HYDRALAZINE HCL 20 MG/ML IJ SOLN: 10.0000 mg | INTRAMUSCULAR | Status: AC | PRN

## 2022-08-14 MED ORDER — ACETAMINOPHEN 325 MG PO TABS
650.0000 mg | ORAL_TABLET | ORAL | Status: DC | PRN
Start: 2022-08-14 — End: 2022-08-14

## 2022-08-14 SURGICAL SUPPLY — 4 items
CATH BALLN WEDGE 5F 110CM (CATHETERS) IMPLANT
GUIDEWIRE .025 260CM (WIRE) IMPLANT
PACK CARDIAC CATHETERIZATION (CUSTOM PROCEDURE TRAY) IMPLANT
SHEATH GLIDE SLENDER 4/5FR (SHEATH) IMPLANT

## 2022-08-14 NOTE — Progress Notes (Signed)
   Heart Failure Stewardship Pharmacist Progress Note   PCP: Bettey Costa, NP PCP-Cardiologist: Kristeen Miss, MD    HPI:  47 yo F with PMH of PE/DVT, CAD, T2DM, HLD, CKD, obesity, polysubstance use, OSA, diabetic gastroparesis, and DKA.   Admitted from 03/20/22 to 03/24/22 with acute PE and DVT now taking Eliquis. ECHO at that time showed LVEF 45-50%, mild concentric LVH, RV normal.   Admitted from 05/16/22 to 05/23/22 with acute on chronic CHF exacerbation. Diuresed 5 lbs and discharged with lasix 60 mg daily. Had HF TOC follow up on 2/7. BP was elevated 180/102 and + leg edema. Hydralazine and imdur were increased.   Presented back to the ED on 4/15 with shortness of breath, weight gain, orthopnea, and LE edema over the last 2 days. Hypertensive 190-200s on arrival and started on IV nitroglycerin. CXR with cardiomegaly, vascular congestion, and small pleural effusions. Renal artery duplex shows no evidence of renal artery stenosis.  RHC on 4/22 - RA 10, PA 33, wedge 16, CO 9, CI 4.7  Current HF Medications: Beta Blocker: carvedilol 25 mg BID SGLT2i: Farxiga 10 mg daily Other: hydralazine 100 mg TID + Imdur 90 mg daily; Ferrlecit 250 mg IV daily *also on amlodipine 10 mg daily  Prior to admission HF Medications: Diuretic: furosemide 20 mg TID Beta blocker: metoprolol XL 100 mg daily SGLT2i: Farxiga 10 mg daily Other: hydralazine 50 mg TID + Imdur 60 mg daily *not taking amlodipine or carvedilol  Pertinent Lab Values: Serum creatinine 3.53, BUN 49, Potassium 3.9, Sodium 135, BNP 2047.2  Vital Signs: Weight: 222 lbs (admission weight: 229 lbs) Blood pressure: 130/80s  Heart rate: 90s  I/O: +1.2L yesterday; net -5.1L  Medication Assistance / Insurance Benefits Check: Does the patient have prescription insurance?  Yes Type of insurance plan: [place] Medicaid  Outpatient Pharmacy:  Prior to admission outpatient pharmacy: [facility] Is the patient willing to use [facility] TOC pharmacy at  discharge? Yes Is the patient willing to transition their outpatient pharmacy to utilize a [facility] outpatient pharmacy?   No    Assessment: 1. Acute on chronic systolic and diastolic CHF (LVEF 45-50%). NYHA class III symptoms. - RHC with evidence of fluid overload, consider restarting lasix. Estimated dry weight ~210 lbs. Strict I/Os and daily weights. Keep K>4 and Mg>2. - Continue carvedilol 25 mg BID - Farxiga 10 mg daily stopped by renal, consider adding back prior to discharge once renal function stabilizes  - Continue hydralazine 100 mg TID - Continue Imdur 90 mg daily - Continue amlodipine 10 mg daily  Plan: 1) Medication changes recommended at this time: - Restart IV lasix   2) Patient assistance: - None pending, has [place] Medicaid  3)  Education  - Patient has been educated on current HF medications and potential additions to HF medication regimen - Patient verbalizes understanding that over the next few months, these medication doses may change and more medications may be added to optimize HF regimen - Patient has been educated on basic disease state pathophysiology and goals of therapy   Sharen Hones, PharmD, BCPS Heart Failure Stewardship Pharmacist Phone [phone]

## 2022-08-14 NOTE — Progress Notes (Signed)
Progress Note  Patient Name: Shirley [name] Date of Encounter: 08/14/2022  Primary Cardiologist: Kristeen Miss, MD   Subjective   Patient seen and examined at her bedside.  Post right heart cath she is eating.  No specific complaints at this time.  Inpatient Medications    Scheduled Meds:  amLODipine  10 mg Oral Daily   [START ON 08/15/2022] aspirin  81 mg Oral Daily   atorvastatin  40 mg Oral Daily   carvedilol  25 mg Oral BID WC   famotidine  20 mg Oral QPM   ferrous sulfate  325 mg Oral Q breakfast   hydrALAZINE  100 mg Oral Q8H   insulin aspart  0-6 Units Subcutaneous TID WC   isosorbide mononitrate  90 mg Oral Daily   ondansetron (ZOFRAN) IV  4 mg Intravenous Once   pantoprazole  40 mg Oral Daily   sodium chloride flush  3 mL Intravenous Q12H   Continuous Infusions:  sodium chloride     sodium chloride 10 mL/hr at 08/14/22 1016   ferric gluconate (FERRLECIT) IVPB Stopped (08/13/22 1239)   heparin 1,450 Units/hr (08/13/22 2307)   PRN Meds: sodium chloride, acetaminophen, hydrALAZINE, labetalol, linaclotide, meclizine, nitroGLYCERIN, ondansetron (ZOFRAN) IV, oxyCODONE-acetaminophen **AND** oxyCODONE, senna, sodium chloride flush   Vital Signs    Vitals:   08/14/22 1030 08/14/22 1045 08/14/22 1100 08/14/22 1209  BP: (!) 141/68 (!) 155/83 139/78 128/78  Pulse: 90  90 92  Resp:    18  Temp:    98.6 F (37 C)  TempSrc:    Oral  SpO2: 100%  99% 100%  Weight:      Height:        Intake/Output Summary (Last 24 hours) at 08/14/2022 1211 Last data filed at 08/14/2022 0730 Gross per 24 hour  Intake 403.17 ml  Output 1200 ml  Net -796.83 ml   Filed Weights   08/12/22 0342 08/13/22 0445 08/14/22 0505  Weight: 99.6 kg 100.1 kg 100.8 kg    Telemetry    Sinus pVC- Personally Reviewed  ECG    None- Personally Reviewed  Physical Exam    General: Comfortable, lying in bed Head: Atraumatic, normal size  Eyes: PEERLA, EOMI  Neck: Supple, normal  JVD Cardiac: Normal S1, S2; RRR; holosystolic murmurs, rubs, or gallops Lungs: Clear to auscultation bilaterally Abd: Soft, nontender, no hepatomegaly  Ext: warm, +2 edema Musculoskeletal: No deformities, BUE and BLE strength normal and equal Skin: Warm and dry, no rashes   Neuro: Alert and oriented to person, place, time, and situation, CNII-XII grossly intact, no focal deficits  Psych: Normal mood and affect   Labs    Chemistry Recent Labs  Lab 08/12/22 0132 08/13/22 0617 08/14/22 0737  NA 134* 136 135  K 3.8 3.7 3.9  CL 104 105 104  CO2 GLUCOSE 190* 162* 120*  BUN 46* 46* 49*  CREATININE 3.90* 3.54* 3.53*  CALCIUM 8.1* 8.4* 8.5*  ALBUMIN 2.2*  --   --   GFRNONAA 14* 15* 15*  ANIONGAP Hematology Recent Labs  Lab 08/12/22 0132 08/13/22 0617 08/14/22 0737  WBC 10.3 9.8 10.6*  RBC 3.26* 3.61* 3.42*  HGB 8.3* 9.2* 8.7*  HCT 24.3* 26.2* 25.2*  MCV 74.5* 72.6* 73.7*  MCH 25.5* 25.5* 25.4*  MCHC 34.2 35.1 34.5  RDW 17.5* 17.6* 17.5*  PLT 352 387 381    Cardiac EnzymesNo results for input(s): "TROPONINI" in the last 168  hours. No results for input(s): "TROPIPOC" in the last 168 hours.   BNP Recent Labs  Lab 08/07/22 2129  BNP 2,047.2*     DDimer No results for input(s): "DDIMER" in the last 168 hours.   Radiology    CARDIAC CATHETERIZATION  Result Date: 08/14/2022 Images from the original result were not included. Shirley [name] is a 47 y.o. female  [id number] LOCATION:  FACILITY: [facility] PHYSICIAN: [name], M.D. 30-Jun-1975 DATE OF PROCEDURE:  08/14/2022 DATE OF DISCHARGE: CARDIAC CATHETERIZATION History obtained from chart review.47 y.o. 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 Found to have CHF and hyperkalemia. Reported adherence with her medicines.  Patient was referred for right heart cath to determine her volume status. HEMODYNAMICS: 1: Right atrial pressure-15/14, mean 10 2: Right ventricular pressure-47/13 3: Pulmonary pressure-50/17, mean 33 4: Pulmonary wedge pressure-A-wave 24, V wave 26, mean 16 5: Cardiac output-9.9 L/min with an index of 4.7 L/min/m    Ms. Holtman has a history of remote CAD and moderate LV dysfunction with chronic renal insufficiency.  She was admitted with symptoms of congestive heart failure.  She was referred for right heart cath to determine her volume status.  Her right atrial pressure was mildly elevated.  Her wedge pressure is mildly elevated.  She has mild to moderate pulm hypertension.  I suspect she remains mildly volume overloaded based on these numbers.  The sheath was removed and a pressure dressing was placed on the right antecubital fossa.  The patient left lab in stable condition. [name]. MD, [facility] 08/14/2022 9:44 AM     Cardiac Studies   RHC 08/14/2022 HEMODYNAMICS:  1: Right atrial pressure-15/14, mean 10 2: Right ventricular pressure-47/13 3: Pulmonary pressure-50/17, mean 33 4: Pulmonary wedge pressure-A-wave 24, V wave 26, mean 16 5: Cardiac output-9.9 L/min with an index of 4.7 L/min/m   TTE 03/21/2022 IMPRESSIONS     1. Left ventricular ejection fraction, by estimation, is 45 to 50%. The  left ventricle has mildly decreased function. The left ventricle  demonstrates global hypokinesis. There is mild concentric left ventricular  hypertrophy. Indeterminate diastolic  filling due to E-A fusion.   2. Right ventricular systolic function is normal. The right ventricular  size is normal.  There is mildly elevated pulmonary artery systolic  pressure. The estimated right ventricular systolic pressure is 35.9 mmHg.   3. Left atrial size was moderately dilated.   4. A small pericardial effusion is present. The pericardial effusion is  circumferential.   5. The mitral valve is normal in structure. Mild to moderate mitral valve  regurgitation.   6. Tricuspid valve regurgitation is mild to moderate.   7. The aortic valve is tricuspid. Aortic valve regurgitation is not  visualized. No aortic stenosis is present.   FINDINGS   Left Ventricle: Left ventricular ejection fraction, by estimation, is 45  to 50%. The left ventricle has mildly decreased function. The left  ventricle demonstrates global hypokinesis. The left ventricular internal  cavity size was normal in size. There is   mild concentric left ventricular hypertrophy. Indeterminate diastolic  filling due to E-A fusion.   Right Ventricle:  The right ventricular size is normal. No increase in  right ventricular wall thickness. Right ventricular systolic function is  normal. There is mildly elevated pulmonary artery systolic pressure. The  tricuspid regurgitant velocity is 2.87   m/s, and with an assumed right atrial pressure of 3 mmHg, the estimated  right ventricular systolic pressure is 35.9 mmHg.   Left Atrium: Left atrial size was moderately dilated.   Right Atrium: Right atrial size was normal in size.   Pericardium: A small pericardial effusion is present. The pericardial  effusion is circumferential.   Mitral Valve: The mitral valve is normal in structure. Mild to moderate  mitral valve regurgitation, with centrally-directed jet.   Tricuspid Valve: The tricuspid valve is normal in structure. Tricuspid  valve regurgitation is mild to moderate.   Aortic Valve: The aortic valve is tricuspid. Aortic valve regurgitation is  not visualized. No aortic stenosis is present.   Pulmonic Valve: The pulmonic valve was  grossly normal. Pulmonic valve  regurgitation is not visualized.   Aorta: The aortic root and ascending aorta are structurally normal, with  no evidence of dilitation.   IAS/Shunts: No atrial level shunt detected by color flow Doppler.   Patient Profile     47 y.o. 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 (restarted Eliquis then), HTN, IDA, untreated OSA, DM, gastroparesis, hepatitis B, pyelonephritis, HLD, peripheral neuropathy, recurrent boils, migraines, seasonal asthma, thrombocytosis, substance abuse (prior cocaine use), obesity, fibromyalgia, prior medication nonadherence, and CKD IV   Assessment & Plan    Acute on chronic heart failure with midrange ejection fraction Dilated cardiomyopathy Acute on chronic kidney disease stage IV Acute on chronic anemia Recent PE with history of DVT History of substance abuse   Right heart catheterization today shows evidence of mild to moderate pulmonary hypertension-her pulmonary hypertension could be of multiple etiologies especially given her untreated OSA.  There is also evidence of mild volume overload -  For now I am going to give her dose of Lasix 60 mg IV x 1.  Nephro is also following  She is getting IV iron for her anemia. Okay to transition back the patient to her Eliquis.  Continue her current medication regimen for both hypertension as well as depressed ejection fraction.     For questions or updates, please contact [facility] Please consult [email or web address] for contact info under Cardiology/STEMI.      Osvaldo Shipper, DO  08/14/2022, 12:11 PM

## 2022-08-14 NOTE — Interval H&P Note (Signed)
History and Physical Interval Note:  08/14/2022 8:47 AM  Shirley [name]  has presented today for surgery, with the diagnosis of HF.  The various methods of treatment have been discussed with the patient and family. After consideration of risks, benefits and other options for treatment, the patient has consented to  Procedure(s): RIGHT HEART CATH (N/A) as a surgical intervention.  [name] history has been reviewed, patient examined, no change in status, stable for surgery.  I have reviewed the patient's chart and labs.  Questions were answered to the patient's satisfaction.     [name]

## 2022-08-14 NOTE — Progress Notes (Signed)
[name]    Assessment/ Plan:   AKI on CKD4 -Baseline creatinine seems to be around 2.6, followed at [facility] -nonoliguric. Possibly ongoing CRS +/- med effect. Stopped farxiga. No obstruction on u/s. Urine sediment okay.  Cr improved to 3.5. Diuretics then held until heart cath - Will resume lasix 60 mg daily PO.    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  We will need to set up follow-up with [facility] - Shirley [name] or extender in 1-2 weeks     Subjective:    She doesn't have strict ins/outs for 4/21.  Had one unmeasured urine void and previously 1.7-2.5 liters consistently.  She had a right heart cath this AM which was suggested of mild volume overload.  She is on lasix 60 mg daily at home and has been on mounjaro per charting.  She states that she doesn't know the doses of her medicines - she just takes what's on the bottle.  Last saw Shirley [name] on 06/20/22.  Labs from 05/31/22 with Cr 2.64, BUN 37, K 3.9 and at that time the med rec was difficult and no diuretic dose was able to be confirmed.  Her outpatient charting indicates that she has  also struggled with gastroparesis.     Review of systems:  Some shortness of breath but this is better Denies n/v    Objective:   BP 128/78 (BP Location: Left Arm)   Pulse 92   Temp 98.6 F (37 C) (Oral)   Resp 18   Ht  (1.702 m)   Wt 100.8 kg   SpO2 100%   BMI 34.82 kg/m   Intake/Output Summary (Last 24 hours) at 08/14/2022 1419 Last data filed at 08/14/2022 0730 Gross per 24 hour  Intake 403.17 ml  Output 1200 ml  Net -796.83 ml   Weight change: 0.771 kg  Physical Exam:   General adult female in bed in no acute distress HEENT normocephalic atraumatic extraocular movements intact sclera anicteric Neck supple trachea midline Lungs clear to auscultation bilaterally normal work of breathing at rest  Heart regular rate and rhythm no rubs or gallops appreciated Abdomen soft nontender nondistended Extremities trace to 1+ edema  Psych normal mood and affect Neuro - tired after procedure; she is a somewhat poor historian   Imaging: CARDIAC CATHETERIZATION  Result Date: 08/14/2022 Images from the original result were not included. Shirley [name] is a 47 y.o. female  [id number] LOCATION:  FACILITY: [facility] PHYSICIAN: Shirley [name], M.D. 12-30-1975 DATE OF PROCEDURE:  08/14/2022 DATE OF DISCHARGE: CARDIAC CATHETERIZATION History obtained from chart review.47 y.o. 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 Found to have CHF and hyperkalemia. Reported adherence with her medicines.  Patient was referred for right  heart cath to determine her volume status. HEMODYNAMICS: 1: Right atrial pressure-15/14, mean 10 2: Right ventricular pressure-47/13 3: Pulmonary pressure-50/17, mean 33 4: Pulmonary wedge pressure-A-wave 24, V wave 26, mean 16 5: Cardiac output-9.9 L/min with an index of 4.7 L/min/m    Shirley [name] has a history of remote CAD and moderate LV dysfunction with chronic renal insufficiency.  She was admitted with symptoms of congestive heart failure.  She was referred for right heart cath to determine her volume status.  Her right atrial pressure was mildly elevated.  Her wedge pressure is mildly elevated.  She has mild to moderate pulm hypertension.  I suspect she remains mildly volume overloaded based on these numbers.  The sheath was removed and a pressure dressing was placed on the right antecubital fossa.  The patient left lab in stable condition. Shirley [name]. MD, [facility] 08/14/2022 9:44 AM     Labs: BMET Recent Labs  Lab 08/08/22 0550 08/09/22 0550 08/10/22 0058 08/11/22 0129 08/12/22 0132 08/13/22 0617 08/14/22 0737  NA 141 137 137 136 134* 136 135  K 3.7 4.0 3.9 3.7 3.8 3.7 3.9  CL 109 108 107 106 104 105 104  CO2 19* 21* GLUCOSE 84 125* 142* 201* 190* 162* 120*  BUN 43* 43* 42* 41* 46* 46* 49*  CREATININE 2.86* 3.41* 3.50* 3.80* 3.90* 3.54* 3.53*  CALCIUM 8.3* 8.1* 8.3* 8.2* 8.1* 8.4* 8.5*  PHOS  --   --   --   --  4.4  --   --    CBC Recent Labs  Lab 08/11/22 0129 08/12/22 0132 08/13/22 0617 08/14/22 0737  WBC 11.2* 10.3 9.8 10.6*  HGB 8.8* 8.3* 9.2* 8.7*  HCT 25.3* 24.3* 26.2* 25.2*  MCV 72.9* 74.5* 72.6* 73.7*  PLT 364 352 387 381    Medications:     amLODipine  10 mg Oral Daily   apixaban  5 mg Oral BID   [START ON 08/15/2022] aspirin  81 mg Oral Daily   atorvastatin  40 mg Oral Daily   carvedilol  25 mg Oral BID WC   famotidine  20 mg Oral QPM   ferrous sulfate  325 mg Oral Q breakfast   hydrALAZINE  100 mg Oral Q8H   insulin aspart  0-6 Units  Subcutaneous TID WC   isosorbide mononitrate  90 mg Oral Daily   ondansetron (ZOFRAN) IV  4 mg Intravenous Once   pantoprazole  40 mg Oral Daily   sodium chloride flush  3 mL Intravenous Q12H    Shirley Emms, MD 2:57 PM 08/14/2022

## 2022-08-14 NOTE — Progress Notes (Addendum)
ANTICOAGULATION CONSULT NOTE   Pharmacy Consult for heparin Indication:  Hx DVT/PE and CVA  Allergies  Allergen Reactions   Morphine And Related Itching and Swelling   Zestril [Lisinopril] Nausea Only and Swelling    Patient Measurements: Height:  (170.2 cm) Weight: 100.8 kg (222 lb 4.8 oz) IBW/kg (Calculated) : 61.6 Heparin Dosing Weight: 83.8 kg  Vital Signs: Temp: 98.6 F (37 C) (04/22 0748) Temp Source: Oral (04/22 0748) BP: 152/82 (04/22 1015) Pulse Rate: 91 (04/22 1015)  Labs: Recent Labs    08/12/22 0132 08/13/22 0617 08/13/22 0630 08/13/22 0630 08/13/22 1552 08/13/22 2138 08/14/22 0737  HGB 8.3* 9.2*  --   --   --   --  8.7*  HCT 24.3* 26.2*  --   --   --   --  25.2*  PLT 352 387  --   --   --   --  381  APTT  --   --  56*   < > 82* 34 90*  HEPARINUNFRC  --   --  >1.10*  --   --   --  1.05*  CREATININE 3.90* 3.54*  --   --   --   --  3.53*   < > = values in this interval not displayed.     Estimated Creatinine Clearance: 23.8 mL/min (A) (by C-G formula based on SCr of 3.53 mg/dL (H)).   Assessment: 64 YOF with hx DVT/ PE and remote CVA, on Eliquis PTA. Pharmacy consulted to dose IV heparin while Eliquis is on hold for planned right heart catheterization on 4/22. Last dose Eliquis 10:06 on 4/20. Will start IV heparin when next dose would be due at 22:00.   aPTT 90 sec (therapeutic) on infusion at 1450 units/hr Heparin level 1.05, is not correlating with aPTT in the setting of recent apixaban administration No bleeding noted  Goal of Therapy:  Heparin level 0.3-0.7 units/ml aPTT 66-102 seconds Monitor platelets by anticoagulation protocol: Yes   Plan:  Continue heparin at 1450 units/hr  Patient is s/p RHC on 4/22 AM Monitor daily CBC, aPTT and heparin level, and for s/sx of bleeding F/u plan to transition back to apixaban    Wilburn Cornelia, PharmD, BCPS Clinical Pharmacist 08/14/2022 10:38 AM   Please refer to [facility] for pharmacy phone  number   _____________________________________________ Addendum:  Per discussion with Dr. Tyson Babinski, will transition from heparin infusion to apixaban at 22:00 on 08/14/22. Heparin infusion to resume at 1450 units/hr and continue until 22:00. STOP Heparin infusion at 22:00 START apixaban  PO BID at 22:00  Wilburn Cornelia, PharmD, BCPS Clinical Pharmacist 08/14/2022 1:50 PM   Please refer to [facility] for pharmacy phone number

## 2022-08-14 NOTE — Progress Notes (Signed)
PROGRESS NOTE Shirley [name]  ZOX:[id number] DOB: [birth year] DOA: 08/07/2022 PCP: Bettey Costa, NP  Brief Narrative/Hospital Course: 47 y.o.f w/ Chronic HFrEF (45-50%, PAH), CKD 4, CAD s/p PCI, stroke, DM2, OSA, h/o DVT/PE on eliquis presented to the ED with complaint of dyspnea orthopnea and bilateral lower leg swelling and about 20 pound weight gain over past 2 days In E C: BP 190-200 systolic on arrival.  Labs with BNP elevated 2047, troponin 31> 32 flat, hyperkalemia 5.7, acidosis bicarb 19, creatinine 2.7 BUN 45 hemoglobin 9.4 platelet 412 pregnancy test negative. Chest x-ray cardiomegaly and vascular congestion with small pleural effusion consistent with CHF. Patient was Given Lasix 80, BP meds, Lokelma and NTG drip and admitted for acute on chronic systolic CHF and severe hypertension. Seen by cardiology continued on nitroglycerin drip, BP meds adjustment IV diuretics without significant improvement, but had worsening renal failure, nephrology was consulted RHC recommended and planning 4/22    Subjective: Seen and examined She feels fine resting well Past 24 hours patient has been afebrile, systolic blood pressure fairly stable, on RA Labs PENDING> came back lateralward creatinine stable 3.5 BUN 49 Weight 229  lb> down to 219> 220>222  lb underwent RHC this morning  Assessment and Plan: Principal Problem:   Acute on chronic systolic CHF (congestive heart failure) Active Problems:   Severe hypertension   CKD (chronic kidney disease) stage 4, GFR 15-29 ml/min   CAD S/P percutaneous coronary angioplasty   Type 2 diabetes mellitus with hyperlipidemia   History of pulmonary embolism   Chronic pain   Acute on chronic combined systolic and diastolic CHF (congestive heart failure)   Acute on chronic heart failure with midrange ejection fraction Dilated Cardiomyopathy Severe hypertension Chronic cor pulmonale: Patient presentation of dyspnea, leg edema and abnormal labs/imaging  consistent with acute exacerbation of chronic systolics secondary to uncontrolled hypertension Cardiology following closely.   >Off  NTG drip 4/20.Last EF 45-50%-unable to determine diastolic dysfunction although suspecting diastolic dysfunction. >Nephrology following due to worsening creatinine> and Lasix held Cont GDMT per cardiology- continue amlodipine, Coreg, hydralazine, Imdur. S/P RHC 08/14/22 am-showing mild to moderate pulmonary hypertension could be multiple etiologies specially given her untreated OSA, also with evidence of mild fluid overload> cardiology planning for Lasix 60 g x 1, nephrology monitoring. Cont to monitor daily I/O, daly weight ( changes as beow), monitor electrolytes and net balance as below. Keep on 2 gm salt and fluid restricted diet. Net IO Since Admission: -5,113.8 mL [08/14/22 1250]  Filed Weights   08/12/22 0342 08/13/22 0445 08/14/22 0505  Weight: 99.6 kg 100.1 kg 100.8 kg    Recent Labs  Lab 08/07/22 2129 08/08/22 0550 08/10/22 0058 08/11/22 0129 08/12/22 0132 08/13/22 0617 08/14/22 0737  BNP 2,047.2*  --   --   --   --   --   --   BUN 45*   < > 42* 41* 46* 46* 49*  CREATININE 2.78*   < > 3.50* 3.80* 3.90* 3.54* 3.53*  K 5.7*   < > 3.9 3.7 3.8 3.7 3.9   < > = values in this interval not displayed.    Chest pain: w/ DOE-in the setting of above. Continue plan as above on antihypertensives.   AKI on CKD stage IV: baseline CREAT ~2.6 recently has been up to 3.3 in January. Worsening of renal dysfunction in the setting of CHF/diuresis -  Creatinine slightly better 4/21 Lasix on hold> redosing x 1. >Avoid nephrotoxic medications including NSAIDs and iodinated intravenous  contrast exposure unless the latter is absolutely indicated. Preferred narcotic agents for pain control ZOX:[id number], fentanyl, and methadone and avoid morphine.Avoid Baclofen and avoid oral sodium phosphate and magnesium citrate based laxatives / bowel preps. Continue strict Input  and Output monitoring and serial renal functions.  Recent Labs    05/23/22 0123 05/31/22 1528 08/07/22 2129 08/08/22 0550 08/09/22 0550 08/10/22 0058 08/11/22 0129 08/12/22 0132 08/13/22 0617 08/14/22 0737  BUN 48* 37* 45* 43* 43* 42* 41* 46* 46* 49*  CREATININE 3.39* 2.64* 2.78* 2.86* 3.41* 3.50* 3.80* 3.90* 3.54* 3.53*    Hyperkalemia resolved with Lokelma  Iron deficiency anemia Anemia chronic kidney disease: Anemia  panel w/ s iron low at 22 ferritin normal, getting IV iron ferric gluconate 250 mg daily x 4 doses.Monitor. Recent Labs    08/10/22 0058 08/11/22 0129 08/12/22 0132 08/13/22 0617 08/14/22 0737  HGB 8.3* 8.8* 8.3* 9.2* 8.7*  MCV 74.9* 72.9* 74.5* 72.6* 73.7*  FERRITIN  --   --  28  --   --   TIBC  --   --  252  --   --   IRON  --   --  22*  --   --     Hyperlipidemia:continue statin.  CAD with history of percutaneous coronary angioplasty Mildly elevated troponin: Troponins are flat likely demand ischemia from hypertension. Cont aspirin/beta-blocker.   T2DM on Farxiga: Currently stable cont SSI. Holding, farxiga, monitor. Recent Labs  Lab 08/13/22 1209 08/13/22 2139 08/14/22 0620 08/14/22 0752 08/14/22 1208  GLUCAP 113* 197* 119* 119* 212*   History of PE on Eliquis>changed to heparin 4/20 for RHC> ok yo transition to eliquis per cardio.  Chronic pain on Percocet  History of CVA Class I Obesity with untreated JYN:[id number]'H Body mass index is 34.82 kg/m. : Will benefit with PCP follow-up, weight loss  healthy lifestyle.  Refusing CPAP here  DVT prophylaxis: Place and maintain sequential compression device Start: 08/09/22 1404eliquis Code Status:   Code Status: Full Code Family Communication: plan of care discussed with patient at bedside. Patient status is: Inpatient because of CHF AKI Level of care: Progressive   Dispo: The patient is from: home            Anticipated disposition: TBD  Objective: Vitals last 24 hrs: Vitals:   08/14/22  1100 08/14/22 1130 08/14/22 1200 08/14/22 1209  BP: 139/78 131/74 128/78 128/78  Pulse: 90   92  Resp:    18  Temp:    98.6 F (37 C)  TempSrc:    Oral  SpO2: 99%   100%  Weight:      Height:       Weight change: 0.771 kg  Physical Examination: General exam: AAox3, weak,older appearing HEENT:Oral mucosa moist, Ear/Nose WNL grossly, dentition normal. Respiratory system: bilaterally diminished BS, no use of accessory muscle Cardiovascular system: S1 & S2 +, regular rate. Gastrointestinal system: Abdomen soft, NT,ND,BS+ Nervous System:Alert, awake, moving extremities and grossly nonfocal Extremities: mild edema on LE Skin: No rashes,no icterus. MSK: Normal muscle bulk,tone, power   Medications reviewed:  Scheduled Meds:  amLODipine  10 mg Oral Daily   [START ON 08/15/2022] aspirin  81 mg Oral Daily   atorvastatin  40 mg Oral Daily   carvedilol  25 mg Oral BID WC   famotidine  20 mg Oral QPM   ferrous sulfate  325 mg Oral Q breakfast   hydrALAZINE  100 mg Oral Q8H   insulin aspart  0-6 Units Subcutaneous TID WC  isosorbide mononitrate  90 mg Oral Daily   ondansetron (ZOFRAN) IV  4 mg Intravenous Once   pantoprazole  40 mg Oral Daily   sodium chloride flush  3 mL Intravenous Q12H   Continuous Infusions:  sodium chloride     sodium chloride 10 mL/hr at 08/14/22 1016   ferric gluconate (FERRLECIT) IVPB Stopped (08/13/22 1239)   heparin 1,450 Units/hr (08/13/22 2307)    Diet Order             Diet Carb Modified Fluid consistency: Thin; Room service appropriate? Yes  Diet effective now                  Intake/Output Summary (Last 24 hours) at 08/14/2022 1250 Last data filed at 08/14/2022 0730 Gross per 24 hour  Intake 403.17 ml  Output 1200 ml  Net -796.83 ml   Net IO Since Admission: -5,113.8 mL [08/14/22 1250]  Wt Readings from Last 3 Encounters:  08/14/22 100.8 kg  05/31/22 94.8 kg  05/23/22 99 kg     Unresulted Labs (From admission, onward)     Start      Ordered   08/15/22 0500  APTT  Daily,   R     Question:  Specimen collection method  Answer:  Lab=Lab collect  See Hyperspace for full Linked Orders Report.   08/13/22 2259   08/15/22 0500  Lipoprotein A (LPA)  Tomorrow morning,   R       Question:  Specimen collection method  Answer:  Lab=Lab collect   08/14/22 0958   08/14/22 0500  CBC  Daily,   R     Question:  Specimen collection method  Answer:  Lab=Lab collect   08/13/22 0752   08/14/22 0500  Heparin level (unfractionated)  Daily,   R     Question:  Specimen collection method  Answer:  Lab=Lab collect  See Hyperspace for full Linked Orders Report.   08/13/22 2259   08/13/22 0500  Basic metabolic panel  Daily,   R     Question:  Specimen collection method  Answer:  Lab=Lab collect   08/12/22 1132   08/12/22 0500  Microalbumin / creatinine urine ratio  Tomorrow morning,   R        08/11/22 1553          Data Reviewed: I have personally reviewed following labs and imaging studies CBC: Recent Labs  Lab 08/10/22 0058 08/11/22 0129 08/12/22 0132 08/13/22 0617 08/14/22 0737  WBC 12.7* 11.2* 10.3 9.8 10.6*  HGB 8.3* 8.8* 8.3* 9.2* 8.7*  HCT 25.0* 25.3* 24.3* 26.2* 25.2*  MCV 74.9* 72.9* 74.5* 72.6* 73.7*  PLT 383 364 352 387 381   Basic Metabolic Panel: Recent Labs  Lab 08/10/22 0058 08/11/22 0129 08/12/22 0132 08/13/22 0617 08/14/22 0737  NA 137 136 134* 136 135  K 3.9 3.7 3.8 3.7 3.9  CL 107 106 104 105 104  CO2 GLUCOSE 142* 201* 190* 162* 120*  BUN 42* 41* 46* 46* 49*  CREATININE 3.50* 3.80* 3.90* 3.54* 3.53*  CALCIUM 8.3* 8.2* 8.1* 8.4* 8.5*  PHOS  --   --  4.4  --   --    Recent Labs  Lab 08/13/22 1209 08/13/22 2139 08/14/22 0620 08/14/22 0752 08/14/22 1208  GLUCAP 113* 197* 119* 119* 212*   Recent Results (from the past 240 hour(s))  MRSA Next Gen by PCR, Nasal     Status: Abnormal   Collection Time:  08/08/22  7:24 PM   Specimen: Nasal Mucosa; Nasal Swab  Result Value Ref  Range Status   MRSA by PCR Next Gen DETECTED (A) NOT DETECTED Final    Comment: RESULT CALLED TO, READ BACK BY AND VERIFIED WITH: L STEVENSON,RN@2152  08/07/20 MK (NOTE) The GeneXpert MRSA Assay (FDA approved for NASAL specimens only), is one component of a comprehensive MRSA colonization surveillance program. It is not intended to diagnose MRSA infection nor to guide or monitor treatment for MRSA infections. Test performance is not FDA approved in patients less than 86 years old. Performed at [facility], [address], [place], [place] [address]     Antimicrobials: Anti-infectives (From admission, onward)    None      Culture/Microbiology None Radiology Studies: CARDIAC CATHETERIZATION  Result Date: 08/14/2022 Images from the original result were not included. Sanye Ledesma Stemler is a 47 y.o. female  [id number] LOCATION:  FACILITY: [facility] PHYSICIAN: [name], M.D. 1976/01/07 DATE OF PROCEDURE:  08/14/2022 DATE OF DISCHARGE: CARDIAC CATHETERIZATION History obtained from chart review.47 y.o. 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 Found to have CHF and hyperkalemia. Reported adherence with her medicines.  Patient was referred for right heart cath to determine her volume status. HEMODYNAMICS: 1: Right atrial pressure-15/14, mean 10 2: Right ventricular pressure-47/13 3: Pulmonary pressure-50/17, mean 33 4: Pulmonary wedge pressure-A-wave 24, V wave 26, mean 16 5: Cardiac output-9.9 L/min with an index of 4.7  L/min/m    Ms. Wurzer has a history of remote CAD and moderate LV dysfunction with chronic renal insufficiency.  She was admitted with symptoms of congestive heart failure.  She was referred for right heart cath to determine her volume status.  Her right atrial pressure was mildly elevated.  Her wedge pressure is mildly elevated.  She has mild to moderate pulm hypertension.  I suspect she remains mildly volume overloaded based on these numbers.  The sheath was removed and a pressure dressing was placed on the right antecubital fossa.  The patient left lab in stable condition. [name]. MD, [facility] 08/14/2022 9:44 AM      LOS: 6 days   Lanae Boast, MD [facility]  08/14/2022, 12:50 PM

## 2022-08-14 NOTE — Interval H&P Note (Signed)
History and Physical Interval Note:  08/14/2022 9:09 AM  Shirley [name]  has presented today for surgery, with the diagnosis of HF.  The various methods of treatment have been discussed with the patient and family. After consideration of risks, benefits and other options for treatment, the patient has consented to  Procedure(s): RIGHT HEART CATH (N/A) as a surgical intervention.  [name] history has been reviewed, patient examined, no change in status, stable for surgery.  I have reviewed the patient's chart and labs.  Questions were answered to the patient's satisfaction.     [name]

## 2022-08-15 ENCOUNTER — Other Ambulatory Visit (HOSPITAL_COMMUNITY): Payer: Self-pay

## 2022-08-15 ENCOUNTER — Other Ambulatory Visit: Payer: Self-pay

## 2022-08-15 DIAGNOSIS — I5023 Acute on chronic systolic (congestive) heart failure: Secondary | ICD-10-CM | POA: Diagnosis not present

## 2022-08-15 LAB — BASIC METABOLIC PANEL
Anion gap: 8 (ref 5–15)
BUN: 55 mg/dL — ABNORMAL HIGH (ref 6–20)
CO2: 23 mmol/L (ref 22–32)
Calcium: 8.7 mg/dL — ABNORMAL LOW (ref 8.9–10.3)
Chloride: 103 mmol/L (ref 98–111)
Creatinine, Ser: 3.75 mg/dL — ABNORMAL HIGH (ref 0.44–1.00)
GFR, Estimated: 14 mL/min — ABNORMAL LOW (ref >60)
Glucose, Bld: 190 mg/dL — ABNORMAL HIGH (ref 70–99)
Potassium: 4.3 mmol/L (ref 3.5–5.1)
Sodium: 134 mmol/L — ABNORMAL LOW (ref 135–145)

## 2022-08-15 LAB — CBC
HCT: 25.1 % — ABNORMAL LOW (ref 36.0–46.0)
Hemoglobin: 8.7 g/dL — ABNORMAL LOW (ref 12.0–15.0)
MCH: 25.4 pg — ABNORMAL LOW (ref 26.0–34.0)
MCHC: 34.7 g/dL (ref 30.0–36.0)
MCV: 73.2 fL — ABNORMAL LOW (ref 80.0–100.0)
Platelets: 390 10*3/uL (ref 150–400)
RBC: 3.43 MIL/uL — ABNORMAL LOW (ref 3.87–5.11)
RDW: 17.4 % — ABNORMAL HIGH (ref 11.5–15.5)
WBC: 10.6 10*3/uL — ABNORMAL HIGH (ref 4.0–10.5)
nRBC: 0 % (ref 0.0–0.2)

## 2022-08-15 LAB — GLUCOSE, CAPILLARY
Glucose-Capillary: 152 mg/dL — ABNORMAL HIGH (ref 70–99)
Glucose-Capillary: 164 mg/dL — ABNORMAL HIGH (ref 70–99)
Glucose-Capillary: 172 mg/dL — ABNORMAL HIGH (ref 70–99)

## 2022-08-15 MED ORDER — CARVEDILOL 25 MG PO TABS
25.0000 mg | ORAL_TABLET | Freq: Two times a day (BID) | ORAL | 0 refills | Status: DC
  Filled 2022-08-15: qty 60, 30d supply, fill #0

## 2022-08-15 MED ORDER — FUROSEMIDE 20 MG PO TABS
60.0000 mg | ORAL_TABLET | Freq: Every day | ORAL | 0 refills | Status: DC
  Filled 2022-08-15: qty 90, 30d supply, fill #0

## 2022-08-15 MED ORDER — ATORVASTATIN CALCIUM 40 MG PO TABS
40.0000 mg | ORAL_TABLET | Freq: Every day | ORAL | 0 refills | Status: DC
  Filled 2022-08-15: qty 30, 30d supply, fill #0

## 2022-08-15 MED ORDER — ISOSORBIDE MONONITRATE ER 30 MG PO TB24
90.0000 mg | ORAL_TABLET | Freq: Every day | ORAL | 0 refills | Status: DC
  Filled 2022-08-15: qty 90, 30d supply, fill #0

## 2022-08-15 MED ORDER — HYDRALAZINE HCL 100 MG PO TABS
100.0000 mg | ORAL_TABLET | Freq: Three times a day (TID) | ORAL | 0 refills | Status: DC
  Filled 2022-08-15: qty 90, 30d supply, fill #0

## 2022-08-15 MED ORDER — AMLODIPINE BESYLATE 10 MG PO TABS
10.0000 mg | ORAL_TABLET | Freq: Every day | ORAL | 0 refills | Status: DC
  Filled 2022-08-15: qty 30, 30d supply, fill #0

## 2022-08-15 MED FILL — Lidocaine HCl Local Preservative Free (PF) Inj 1%: INTRAMUSCULAR | Qty: 30 | Status: AC

## 2022-08-15 NOTE — TOC Transition Note (Signed)
Transition of Care (TOC) - CM/SW Discharge Note Donn Pierini RN, BSN Transitions of Care Unit 4E- RN Case Manager See Treatment Team for direct phone #   Patient Details  Name: Shirley [name] MRN: [id number] Date of Birth: 06-Jan-1976  Transition of Care [facility]) CM/SW Contact:  Darrold Span, RN Phone Number: 08/15/2022, 2:04 PM   Clinical Narrative:    Pt stable for transition home today, Pt to f/u as per AVS instructions- has appointments.  No TOC needs noted.    Final next level of care: Home/Self Care Barriers to Discharge: Barriers Resolved   Patient Goals and CMS Choice CMS Medicare.gov Compare Post Acute Care list provided to:: Patient Choice offered to / list presented to : NA  Discharge Placement                 Home        Discharge Plan and Services Additional resources added to the After Visit Summary for   In-house Referral: NA Discharge Planning Services: CM Consult Post Acute Care Choice: NA          DME Arranged: N/A DME Agency: NA       HH Arranged: NA HH Agency: NA        Social Determinants of Health (SDOH) Interventions SDOH Screenings   Food Insecurity: No Food Insecurity (08/08/2022)  Housing: Low Risk  (08/08/2022)  Transportation Needs: No Transportation Needs (08/08/2022)  Utilities: Not At Risk (08/08/2022)  Alcohol Screen: Low Risk  (08/09/2022)  Depression (PHQ2-9): Medium Risk (11/28/2018)  Financial Resource Strain: Low Risk  (08/09/2022)  Tobacco Use: Medium Risk (08/14/2022)     Readmission Risk Interventions    08/15/2022    2:04 PM 05/23/2022   11:50 AM 03/24/2022    3:38 PM  Readmission Risk Prevention Plan  Transportation Screening Complete Complete Complete  Medication Review (RN Care Manager) Complete Complete Referral to Pharmacy  PCP or Specialist appointment within 3-5 days of discharge Complete Complete   HRI or Home Care Consult Complete Complete Complete  SW Recovery Care/Counseling Consult Complete   Complete  Palliative Care Screening Not Applicable Not Applicable Not Applicable  Skilled Nursing Facility Not Applicable Not Applicable Not Applicable

## 2022-08-15 NOTE — Progress Notes (Signed)
Mobility Specialist: Progress Note   08/15/22 1409  Mobility  Activity Ambulated with assistance in hallway  Level of Assistance Contact guard assist, steadying assist  Assistive Device None  Distance Ambulated (ft) 220 ft  Activity Response Tolerated well  Mobility Referral Yes  $Mobility charge 1 Mobility   Pre-Mobility: 93 HR, 152/88 (105) BP Post-Mobility: 101 HR, 95% SpO2  Pt received sitting EOB and agreeable to mobility. Mod I to stand and contact guard during ambulation. No c/o throughout. Pt sitting EOB after session with call bell in reach. Family present in the room.   Bethenny Losee Mobility Specialist Please contact via SecureChat or Rehab office at [phone]

## 2022-08-15 NOTE — Progress Notes (Addendum)
Rounding Note    Patient Name: Shirley [name] Date of Encounter: 08/15/2022  [facility] Cardiologist: Kristeen Miss, MD   Subjective   She feels well, no SOB, wants to go home. She denied any chest pain, dizziness, oliguria.   Inpatient Medications    Scheduled Meds:  amLODipine  10 mg Oral Daily   apixaban  5 mg Oral BID   aspirin  81 mg Oral Daily   atorvastatin  40 mg Oral Daily   carvedilol  25 mg Oral BID WC   darbepoetin (ARANESP) injection - NON-DIALYSIS  40 mcg Subcutaneous Q Mon-1800   famotidine  20 mg Oral QPM   ferrous sulfate  325 mg Oral Q breakfast   furosemide  60 mg Oral Daily   hydrALAZINE  100 mg Oral Q8H   insulin aspart  0-6 Units Subcutaneous TID WC   isosorbide mononitrate  90 mg Oral Daily   ondansetron (ZOFRAN) IV  4 mg Intravenous Once   pantoprazole  40 mg Oral Daily   sodium chloride flush  3 mL Intravenous Q12H   Continuous Infusions:  sodium chloride     ferric gluconate (FERRLECIT) IVPB Stopped (08/14/22 1524)   PRN Meds: sodium chloride, acetaminophen, linaclotide, meclizine, nitroGLYCERIN, ondansetron (ZOFRAN) IV, oxyCODONE-acetaminophen **AND** oxyCODONE, senna, sodium chloride flush   Vital Signs    Vitals:   08/14/22 2305 08/15/22 0514 08/15/22 0519 08/15/22 0805  BP: 135/77  (!) 140/88 133/75  Pulse: 90  91 90  Resp: Temp: 97.7 F (36.5 C)  98.2 F (36.8 C)   TempSrc: Oral  Oral   SpO2: 98%  97%   Weight:  102 kg    Height:        Intake/Output Summary (Last 24 hours) at 08/15/2022 1010 Last data filed at 08/15/2022 0026 Gross per 24 hour  Intake 462.61 ml  Output 600 ml  Net -137.39 ml      08/15/2022    5:14 AM 08/14/2022    5:05 AM 08/13/2022    4:45 AM  Last 3 Weights  Weight (lbs) 224 lb 12.8 oz 222 lb 4.8 oz 220 lb 9.6 oz  Weight (kg) 101.969 kg 100.835 kg 100.064 kg      Telemetry    Sinus rhythm, occasional PVCs, brief NSVT and atrial tachycardia noted - Personally  Reviewed  ECG    N/A today - Personally Reviewed  Physical Exam   GEN: No acute distress.   Neck: JVD elevated to upper neck with HOB 30 degree  Cardiac: RRR, no murmurs, rubs, or gallops.  Respiratory: Clear to auscultation bilaterally. On room air  GI: Soft, abdomen obese  MS: BLE 1+ edema; No deformity. Neuro:  Nonfocal  Psych: Normal affect   Labs    High Sensitivity Troponin:   Recent Labs  Lab 08/07/22 2129 08/07/22 2315 08/09/22 1019 08/09/22 1148 08/10/22 1054  TROPONINIHS 31* 32* 30* 28* 31*     Chemistry Recent Labs  Lab 08/12/22 0132 08/13/22 0617 08/14/22 0737 08/14/22 0930 08/15/22 0141  NA 134* 136 135 139  139 134*  K 3.8 3.7 3.9 3.9  3.9 4.3  CL 104 105 104  --  103  CO2 --  23  GLUCOSE 190* 162* 120*  --  190*  BUN 46* 46* 49*  --  55*  CREATININE 3.90* 3.54* 3.53*  --  3.75*  CALCIUM 8.1* 8.4* 8.5*  --  8.7*  ALBUMIN 2.2*  --   --   --   --  GFRNONAA 14* 15* 15*  --  14*  ANIONGAP --  8    Lipids No results for input(s): "CHOL", "TRIG", "HDL", "LABVLDL", "LDLCALC", "CHOLHDL" in the last 168 hours.  Hematology Recent Labs  Lab 08/13/22 0617 08/14/22 0737 08/14/22 0930 08/15/22 0141  WBC 9.8 10.6*  --  10.6*  RBC 3.61* 3.42*  --  3.43*  HGB 9.2* 8.7* 8.5*  8.5* 8.7*  HCT 26.2* 25.2* 25.0*  25.0* 25.1*  MCV 72.6* 73.7*  --  73.2*  MCH 25.5* 25.4*  --  25.4*  MCHC 35.1 34.5  --  34.7  RDW 17.6* 17.5*  --  17.4*  PLT 387 381  --  390   Thyroid  Recent Labs  Lab 08/10/22 0058  TSH 1.989    BNPNo results for input(s): "BNP", "PROBNP" in the last 168 hours.  DDimer No results for input(s): "DDIMER" in the last 168 hours.   Radiology    CARDIAC CATHETERIZATION  Result Date: 08/14/2022 Images from the original result were not included. Shirley [name] is a 47 y.o. female  [id number] LOCATION:  FACILITY: [facility] PHYSICIAN: [name], M.D. [birth year] DATE OF PROCEDURE:  08/14/2022 DATE OF DISCHARGE: CARDIAC  CATHETERIZATION History obtained from chart review.47 y.o. 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 Found to have CHF and hyperkalemia. Reported adherence with her medicines.  Patient was referred for right heart cath to determine her volume status. HEMODYNAMICS: 1: Right atrial pressure-15/14, mean 10 2: Right ventricular pressure-47/13 3: Pulmonary pressure-50/17, mean 33 4: Pulmonary wedge pressure-A-wave 24, V wave 26, mean 16 5: Cardiac output-9.9 L/min with an index of 4.7 L/min/m    Shirley [name] has a history of remote CAD and moderate LV dysfunction with chronic renal insufficiency.  She was admitted with symptoms of congestive heart failure.  She was referred for right heart cath to determine her volume status.  Her right atrial pressure was mildly elevated.  Her wedge pressure is mildly elevated.  She has mild to moderate pulm hypertension.  I suspect she remains mildly volume overloaded based on these numbers.  The sheath was removed and a pressure dressing was placed on the right antecubital fossa.  The patient left lab in stable condition. [name]. MD, [facility] 08/14/2022 9:44 AM     Cardiac Studies   RHC on 08/14/22:  1: Right atrial pressure-15/14, mean 10 2: Right ventricular pressure-47/13 3: Pulmonary pressure-50/17, mean 33 4: Pulmonary wedge pressure-A-wave 24, V wave 26, mean 16 5: Cardiac output-9.9 L/min with an index of 4.7 L/min/m  Renal artery doppler 08/09/22: Summary:  Renal:    Right: No evidence  of right renal artery stenosis. Abnormal right         Resistive Index. Normal size right kidney. RRV flow present.  Left:  No evidence of left renal artery stenosis. Abnormal left         Resisitve Index. Normal size of left kidney. LRV flow         present.   Echo from 03/21/22:   1. Left ventricular ejection fraction, by estimation, is 45 to 50%. The  left  ventricle has mildly decreased function. The left ventricle  demonstrates global hypokinesis. There is mild concentric left ventricular  hypertrophy. Indeterminate diastolic  filling due to E-A fusion.   2. Right ventricular systolic function is normal. The right ventricular  size is normal. There is mildly elevated pulmonary artery systolic  pressure. The estimated right ventricular systolic pressure is 35.9 mmHg.   3. Left atrial size was moderately dilated.   4. A small pericardial effusion is present. The pericardial effusion is  circumferential.   5. The mitral valve is normal in structure. Mild to moderate mitral valve  regurgitation.   6. Tricuspid valve regurgitation is mild to moderate.   7. The aortic valve is tricuspid. Aortic valve regurgitation is not  visualized. No aortic stenosis is present.     Patient Profile     47 y.o. female with complex PMH of CAD s/p PTCA PDA 2008, CVA, chronic HFrEF, pulmonary hypertension, DVT/PE, untreated OSA, CKD IV, type 2 DM with neuropathy, HTN, obesity, who is currently admitted since 08/07/22 acute on chronic HFmrEF, course complicated by sever HTN, AKI on CKD IV.   Assessment & Plan    Acute on chronic systolic and diastolic heart failure  Pulmonary hypertension  - presented with dyspnea, weight gain 20 ibs, BLE edema - BNP 2047, Hs trop 30s, CXR with CHF, POA on 08/07/22 - Echo from 03/21/22 showed LVEF 45-50%, global HPK, mild LVH, indeterminate diastolic filling, normal RV, mildly elevated PASP with RVSP 35.41mmHg. Small pericardial effusion. Mild to mod MR and TR.  - RHC  08/14/22 showed RA mean 10, RV 47/13, PA 50/17 mean 33, wedge mean 16, CO 9.9L/min with index 4.7L/min/m2 - Etiology is multifactorial : systolic/diastolic failure + pulmonary HTN 2/2 OSA/obesity  - diuresis was difficult due to AKI, Cr peaked at 3.9 from 2.6-2.8 ranges on 08/12/22, downtrend now, nephrology following, felt etiology is CRS + medication effect, farxiga has been stopped, recommend resume PO Lasix 60mg  daily since 08/14/22; UOP not accurate, weight is 229 >218>224ibs over the past 8 days, agree with current diuresis based on RHC findings  - GDMT: continue coreg 25mg  BID, hydralazine 100mg  Q8H, imdur 90mg , not candidate for ARNI/ACEI/ARB/MRA/SGLT2I due to advanced kidney disease  - Follow up with cardiology arranged on 09/08/22   CAD with hx of remote PCI -hx of CAD w/ PTCA PDA 2008, no sig CAD at cath 2010  - no chest pain, Hs trop 30s, demand ischemia  - continue ASA 81mg , lipitor 40mg , and coreg 25mg  BID   HTN - BP is fairly controlled on coreg 25mg  BID, lasix 60mg , hydralazine 100mg  TID, imdur 90mg , and amlodipine 10mg  daily   OSA - need outpatient sleep study update   AKI on CKD IV Anemia  Type 2 DM Hx of DVT/PE 2023 Hx of CVA Hx of cocaine abuse 2019-2021 - per primary team   For questions or updates, please contact [facility] Please consult [email or web address] for contact info under  Signed, Cyndi Bender, NP  08/15/2022, 10:10 AM    Patient seen and examined, note reviewed with the signed Advanced Practice Provider. I personally reviewed laboratory data, imaging studies and relevant notes. I independently examined the patient and formulated the important aspects of the plan. I have personally discussed the plan with the patient and/or family. Comments or changes to the note/plan are indicated below.  She has improved clinically.  Agree with continuing Lasix 60 mg daily.  She will need to have lab follow-up in the outpatient setting.  Continue her aspirin and  statin. Blood pressure is acceptable no change in current antihypertensive regimen.  Thomasene Ripple DO, MS [facility] Attending Cardiologist [facility]  [address] [place], [place] [address] [phone] Website: [email or web address]

## 2022-08-15 NOTE — Progress Notes (Signed)
   Heart Failure Stewardship Pharmacist Progress Note   PCP: Patel, Seema K, NP PCP-Cardiologist:Bettey Costaiss, MD    HPI:  Shirley [name] with PMH of PE/DVT, CAD, T2DM, HLD, CKD, obesity, polysubstance use, OSA, diabetic gastroparesis, and DKA.   Admitted from 03/20/22 to 03/24/22 with acute PE and DVT now taking Eliquis. ECHO at that time showed LVEF 45-50%, mild concentric LVH, RV normal.   Admitted from 05/16/22 to 05/23/22 with acute on chronic CHF exacerbation. Diuresed 5 lbs and discharged with lasix 60 mg daily. Had HF TOC follow up on 2/7. BP was elevated 180/102 and + leg edema. Hydralazine and imdur were increased.   Presented back to the ED on 4/15 with shortness of breath, weight gain, orthopnea, and LE edema over the last 2 days. Hypertensive 190-200s on arrival and started on IV nitroglycerin. CXR with cardiomegaly, vascular congestion, and small pleural effusions. Renal artery duplex shows no evidence of renal artery stenosis.  RHC on 4/22 - RA 10, PA 33, wedge 16, CO 9, CI 4.7  Current HF Medications: Diuretic: furosemide 60 mg PO daily Beta Blocker: carvedilol 25 mg BID SGLT2i: Farxiga 10 mg daily Other: hydralazine 100 mg TID + Imdur 90 mg daily; Ferrlecit 250 mg IV daily *also on amlodipine 10 mg daily  Prior to admission HF Medications: Diuretic: furosemide 20 mg TID Beta blocker: metoprolol XL 100 mg daily SGLT2i: Farxiga 10 mg daily Other: hydralazine 50 mg TID + Imdur 60 mg daily *not taking amlodipine or carvedilol  Pertinent Lab Values: Serum creatinine 3.75, BUN 55, Potassium 4.3, Sodium 134, BNP 2047.2 TSAT 9, Ferritin 28  Vital Signs: Weight: 224 lbs (admission weight: 229 lbs) Blood pressure: 130/70s  Heart rate: 80-90s  I/O: -1.5L yesterday; net -5.3L  Medication Assistance / Insurance Benefits Check: Does the patient have prescription insurance?  Yes Type of insurance plan: [place] Medicaid  Outpatient Pharmacy:  Prior to admission outpatient  pharmacy: [facility] Is the patient willing to use [facility] TOC pharmacy at discharge? Yes Is the patient willing to transition their outpatient pharmacy to utilize a [facility] outpatient pharmacy?   No    Assessment: 1. Acute on chronic systolic and diastolic CHF (LVEF 45-50%). NYHA class III symptoms. - Continue furosemide 60 mg PO daily. Estimated dry weight ~210 lbs. Strict I/Os and daily weights. Keep K>4 and Mg>2. - Continue carvedilol 25 mg BID - Farxiga 10 mg daily stopped by renal, consider adding back prior to discharge once renal function stabilizes  - Continue hydralazine 100 mg TID - Continue Imdur 90 mg daily - Continue amlodipine 10 mg daily - Iron stores low, continue Ferlecit 250 mg IV daily (first dose given on 4/20)  Plan: 1) Medication changes recommended at this time: - Continue current therapy  2) Patient assistance: - None pending, has [place] Medicaid  3)  Education  - Patient has been educated on current HF medications and potential additions to HF medication regimen - Patient verbalizes understanding that over the next few months, these medication doses may change and more medications may be added to optimize HF regimen - Patient has been educated on basic disease state pathophysiology and goals of therapy   Sharen Hones, PharmD, BCPS Heart Failure Stewardship Pharmacist Phone [phone]

## 2022-08-15 NOTE — Progress Notes (Signed)
[facility] Progress Note    Assessment/ Plan:   AKI on CKD4 -Baseline creatinine seems to be around 2.6, followed at [facility] -nonoliguric. Possibly ongoing CRS +/- med effect. Stopped farxiga. No obstruction on u/s. Urine sediment okay. Diuretics were then held until heart cath - I have resumed lasix 60 mg PO daily    - no indication for renal replacement therapy - she is stable with supportive measures - would hold her farxiga for now - reassess at outpatient follow-up    CKD stage IV - Diabetic kidney disease - Baseline Cr may be around 2.6 - sees Dr. Kathrene Bongo at [facility]   Acute on chronic combined HFrEF exacerbation, chronic cor pulmonale - right heart cath suggestive of mild fluid overload  -Cardiology following -Daily labs, strict I's and O's, daily weights - diuretics as above    Hypertension -acceptable control   Hyperkalemia -Resolved   Anemia CKD  -iron deficient, receiving IV iron load, on PO Fe -Transfuse as needed for hemoglobin less than 7 - She got aranesp 40 mcg once on 4/22     DM2 with hyperglycemia -Management per primary service   History of DVT/PE -On Eliquis, per primary team   Disposition - she is stable for discharge from a renal standpoint.  I will set up follow-up with [facility] - Dr. Kathrene Bongo or extender in 1-2 weeks     Subjective:    She had 1.8 liters as well as 2 unmeasured urine voids over 4/22. She had a right heart cath on 4/22 which was suggestive of mild volume overload so her lasix 60 mg daily was resumed.  Her inpatient and outpatient med recs appear to have been complicated by not knowing her medication doses.  She states that she takes what is on the bottle.  Last saw Dr. Kathrene Bongo on 06/20/22.  Labs from 05/31/22 with Cr 2.64, BUN 37, K 3.9 and at that time the med rec was difficult and no diuretic dose was able to be confirmed.  Her outpatient charting indicates that she has also struggled with  gastroparesis.  Her husband is here at bedside and I updated him.   Review of systems:   She denies shortness of breath or chest pain  Denies n/v    Objective:   BP (!) 152/83 (BP Location: Left Arm)   Pulse 96   Temp 98.2 F (36.8 C) (Oral)   Resp 15   Ht  (1.702 m)   Wt 102 kg   SpO2 95%   BMI 35.21 kg/m   Intake/Output Summary (Last 24 hours) at 08/15/2022 1329 Last data filed at 08/15/2022 2956 Gross per 24 hour  Intake 462.61 ml  Output 600 ml  Net -137.39 ml   Weight change: 1.134 kg  Physical Exam:    General adult female in bed in no acute distress HEENT normocephalic atraumatic extraocular movements intact sclera anicteric Neck supple trachea midline Lungs clear to auscultation bilaterally normal work of breathing at rest on room air  Heart S1S2 no rub Abdomen soft nontender nondistended Extremities no edema  Psych normal mood and affect Neuro - alert and oriented; provides history and follows commands   Imaging: CARDIAC CATHETERIZATION  Result Date: 08/14/2022 Images from the original result were not included. Shirley [name] is a 47 y.o. female  [id number] LOCATION:  FACILITY: [facility] PHYSICIAN: Shirley [name], M.D. 24-Mar-1976 DATE OF PROCEDURE:  08/14/2022 DATE OF DISCHARGE: CARDIAC CATHETERIZATION History obtained from chart review.47 y.o. 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 Found to have CHF and hyperkalemia. Reported adherence with her medicines.  Patient was referred for right heart  cath to determine her volume status. HEMODYNAMICS: 1: Right atrial pressure-15/14, mean 10 2: Right ventricular pressure-47/13 3: Pulmonary pressure-50/17, mean 33 4: Pulmonary wedge pressure-A-wave 24, V wave 26, mean 16 5: Cardiac output-9.9 L/min with an index of 4.7 L/min/m    Shirley [name] has a history of remote CAD and moderate LV dysfunction with chronic renal insufficiency.  She was admitted with symptoms of congestive heart failure.  She was referred for right heart cath to determine her volume status.  Her right atrial pressure was mildly elevated.  Her wedge pressure is mildly elevated.  She has mild to moderate pulm hypertension.  I suspect she remains mildly volume overloaded based on these numbers.  The sheath was removed and a pressure dressing was placed on the right antecubital fossa.  The patient left lab in stable condition. Shirley [name], [facility] 08/14/2022 9:44 AM     Labs: BMET Recent Labs  Lab 08/09/22 0550 08/10/22 0058 08/11/22 0129 08/12/22 0132 08/13/22 0617 08/14/22 0737 08/14/22 0930 08/15/22 0141  NA 137 137 136 134* 136 135 139  139 134*  K 4.0 3.9 3.7 3.8 3.7 3.9 3.9  3.9 4.3  CL 108 107 106 104 105 104  --  103  CO2 21* --  23  GLUCOSE 125* 142* 201* 190* 162* 120*  --  190*  BUN 43* 42* 41* 46* 46* 49*  --  55*  CREATININE 3.41* 3.50* 3.80* 3.90* 3.54* 3.53*  --  3.75*  CALCIUM 8.1* 8.3* 8.2* 8.1* 8.4* 8.5*  --  8.7*  PHOS  --   --   --  4.4  --   --   --   --    CBC Recent Labs  Lab 08/12/22 0132 08/13/22 0617 08/14/22 0737 08/14/22 0930 08/15/22 0141  WBC 10.3 9.8 10.6*  --  10.6*  HGB 8.3* 9.2* 8.7* 8.5*  8.5* 8.7*  HCT 24.3* 26.2* 25.2* 25.0*  25.0* 25.1*  MCV 74.5* 72.6* 73.7*  --  73.2*  PLT 352 387 381  --  390    Medications:     amLODipine  10 mg Oral Daily   apixaban  5 mg Oral BID   aspirin  81 mg Oral Daily   atorvastatin  40 mg Oral Daily   carvedilol  25 mg Oral BID WC   darbepoetin (ARANESP) injection  - NON-DIALYSIS  40 mcg Subcutaneous Q Mon-1800   famotidine  20 mg Oral QPM   ferrous sulfate  325 mg Oral Q breakfast   furosemide  60 mg Oral Daily   hydrALAZINE  100 mg Oral Q8H   insulin aspart  0-6 Units Subcutaneous TID WC   isosorbide mononitrate  90 mg Oral Daily   ondansetron (ZOFRAN) IV  4 mg Intravenous Once   pantoprazole  40 mg Oral Daily   sodium chloride flush  3 mL Intravenous Q12H    Estanislado Emms, [name] 1:45 PM 08/15/2022

## 2022-08-15 NOTE — Discharge Summary (Signed)
Physician Discharge Summary  Shirley [name]:[id number] DOB: [birth year] DOA: 08/07/2022  PCP: Bettey Costa, NP  Admit date: 08/07/2022 Discharge date: 08/15/2022 Recommendations for Outpatient Follow-up:  Follow up with PCP in 1 weeks-call for appointment Please obtain BMP/CBC in one week  Discharge Dispo: Home Discharge Condition: Stable Code Status:   Code Status: Full Code Diet recommendation:  Diet Order             Diet Carb Modified Fluid consistency: Thin; Room service appropriate? Yes  Diet effective now                    Brief/Interim Summary: 47 y.o.f w/ Chronic HFrEF (45-50%, PAH), CKD 4, CAD s/p PCI, stroke, DM2, OSA, h/o DVT/PE on eliquis presented to the ED with complaint of dyspnea orthopnea and bilateral lower leg swelling and about 20 pound weight gain over past 2 days In E C: BP 190-200 systolic on arrival.  Labs with BNP elevated 2047, troponin 31> 32 flat, hyperkalemia 5.7, acidosis bicarb 19, creatinine 2.7 BUN 45 hemoglobin 9.4 platelet 412 pregnancy test negative. Chest x-ray cardiomegaly and vascular congestion with small pleural effusion consistent with CHF. Patient was Given Lasix 80, BP meds, Lokelma and NTG drip and admitted for acute on chronic systolic CHF and severe hypertension. Seen by cardiology continued on nitroglycerin drip, BP meds adjustment IV diuretics  >Off  NTG drip 4/20.Last EF 45-50%-unable to determine diastolic dysfunction although suspecting diastolic dysfunction. >Nephrology following due to worsening creatinine> and Lasix held Cont GDMT per cardiology w/ Coreg 25mg  BID, hydralazine 100mg  Q8H, imdur 90mg , not candidate for ARNI/ACEI/ARB/MRA/SGLT2I due to advanced kidney disease.Follow up with cardiology arranged on 09/08/22 S/P RHC 08/14/22-showing mild to moderate pulmonary hypertension could be multiple etiologies specially given her untreated resumed po lasix no indication for RRT. Patient feels well requesting for discharge  home pending nephrology and cardiology clearance    Discharge Diagnoses:  Principal Problem:   Acute on chronic systolic CHF (congestive heart failure) Active Problems:   Severe hypertension   CKD (chronic kidney disease) stage 4, GFR 15-29 ml/min   CAD S/P percutaneous coronary angioplasty   Type 2 diabetes mellitus with hyperlipidemia   History of pulmonary embolism   Chronic pain   Acute on chronic combined systolic and diastolic CHF (congestive heart failure) Acute on chronic heart failure with midrange ejection fraction Dilated Cardiomyopathy Severe hypertension Chronic cor pulmonale: >Off  NTG drip 4/20.Last EF 45-50%-unable to determine diastolic dysfunction although suspecting diastolic dysfunction. >Nephrology following due to worsening creatinine> and Lasix held Cont GDMT per cardiology- On amlodipine, Coreg, hydralazine, Imdur. S/P RHC 08/14/22-showing mild to moderate pulmonary hypertension could be multiple etiologies specially given her untreated resumed po lasix no indication for RRT  Patient will need close follow-up with cardiology and nephrology as outpatient Net IO Since Admission: -5,251.19 mL [08/15/22 0800]   Chest pain: w/ DOE-in the setting of above. Continue plan as above on antihypertensives.   AKI on CKD stage IV: baseline CREAT ~2.6 recently has been up to 3.3 in January. Worsening of renal dysfunction in the setting of CHF/diuresis -  Creatinine slightly better 4/21 Lasix on hold for RHC, nephrology resumed the Lasix-no indication for renal replacement therapy and will need close follow-up with nephro >Avoid nephrotoxic medications including NSAIDs and iodinated intravenous contrast exposure unless the latter is absolutely indicated. Preferred narcotic agents for pain control WJX:BJYNWGNFAOZHY, fentanyl, and methadone and avoid morphine.Avoid Baclofen and avoid oral sodium phosphate and magnesium citrate based laxatives /  bowel preps. Continue strict Input  and Output monitoring and serial renal functions.  Creatinine stable at 3.7   Hyperkalemia resolved with Lokelma Iron deficiency anemia/Anemia chronic kidney disease:Anemia  panel w/ s iron low at 22 ferritin normal, getting IV iron ferric gluconate 250 mg daily x 4 doses.Monitor.  Hemoglobin stable at 8.7 mg   Hyperlipidemia:continue statin.  CAD with history of percutaneous coronary angioplasty Mildly elevated troponin: Troponins are flat likely demand ischemia from hypertension. Cont aspirin/beta-blocker.   T2DM on Farxiga: Currently stable cont SSI. Holding farxiga for now per nephro-reassess as outpatient History of PE on Eliquis>changed to heparin 4/20 for RHC> now on po eliquis post cath  Chronic pain on Percocet  History of CVA Class I Obesity with untreated XBJ:YNWGNFA'O Body mass index is 35.21 kg/m. : Will benefit with PCP follow-up, weight loss  healthy lifestyle.  Refusing CPAP here  Consults: Nephrology, cardiology Subjective: Alert awake oriented no shortness of breath or chest pain.  Requesting for discharge home today awaiting for nephrology cardiology  Discharge Exam: Vitals:   08/15/22 0805 08/15/22 1207  BP: 133/75 (!) 152/83  Pulse: 90 96  Resp: 19 15  Temp:  98.2 F (36.8 C)  SpO2:  95%   General: Pt is alert, awake, not in acute distress Cardiovascular: RRR, S1/S2 +, no rubs, no gallops Respiratory: CTA bilaterally, no wheezing, no rhonchi Abdominal: Soft, NT, ND, bowel sounds + Extremities: no edema, no cyanosis  Discharge Instructions  Discharge Instructions     (HEART FAILURE PATIENTS) Call MD:  Anytime you have any of the following symptoms: 1) 3 pound weight gain in 24 hours or 5 pounds in 1 week 2) shortness of breath, with or without a dry hacking cough 3) swelling in the hands, feet or stomach 4) if you have to sleep on extra pillows at night in order to breathe.   Complete by: As directed    Discharge instructions   Complete by: As  directed    Please call call MD or return to ER for similar or worsening recurring problem that brought you to hospital or if any fever,nausea/vomiting,abdominal pain, uncontrolled pain, chest pain,  shortness of breath or any other alarming symptoms.  Please follow-up your doctor as instructed in a week time and call the office for appointment.  Please avoid alcohol, smoking, or any other illicit substance and maintain healthy habits including taking your regular medications as prescribed.  You were cared for by a hospitalist during your hospital stay. If you have any questions about your discharge medications or the care you received while you were in the hospital after you are discharged, you can call the unit and ask to speak with the hospitalist on call if the hospitalist that took care of you is not available.  Once you are discharged, your primary care physician will handle any further medical issues. Please note that NO REFILLS for any discharge medications will be authorized once you are discharged, as it is imperative that you return to your primary care physician (or establish a relationship with a primary care physician if you do not have one) for your aftercare needs so that they can reassess your need for medications and monitor your lab values   Increase activity slowly   Complete by: As directed       Allergies as of 08/15/2022       Reactions   Morphine And Related Itching, Swelling   Zestril [lisinopril] Nausea Only, Swelling  Medication List     STOP taking these medications    dapagliflozin propanediol 10 MG Tabs tablet Commonly known as: Farxiga       TAKE these medications    albuterol 108 (90 Base) MCG/ACT inhaler Commonly known as: VENTOLIN HFA Inhale 2 puffs into the lungs every 4 (four) hours as needed for wheezing or shortness of breath.   amLODipine 10 MG tablet Commonly known as: NORVASC Take 1 tablet (10 mg total) by mouth daily.    apixaban 5 MG Tabs tablet Commonly known as: ELIQUIS Take 1 tablet (5 mg total) by mouth 2 (two) times daily.   aspirin 81 MG chewable tablet Chew 81 mg by mouth daily.   atorvastatin 40 MG tablet Commonly known as: LIPITOR Take 1 tablet (40 mg total) by mouth daily.   carvedilol 25 MG tablet Commonly known as: COREG Take 1 tablet (25 mg total) by mouth 2 (two) times daily with a meal.   cyclobenzaprine 10 MG tablet Commonly known as: FLEXERIL Take 10 mg by mouth 3 (three) times daily as needed for muscle spasms.   dicyclomine 10 MG capsule Commonly known as: BENTYL Take 1 capsule (10 mg total) by mouth 4 (four) times daily -  before meals and at bedtime. What changed:  when to take this reasons to take this   diphenhydrAMINE 25 MG tablet Commonly known as: BENADRYL Take 25 mg by mouth every 6 (six) hours as needed for allergies.   famotidine 20 MG tablet Commonly known as: PEPCID Take 20 mg by mouth at bedtime.   ferrous sulfate 325 (65 FE) MG tablet Take 325 mg by mouth daily with breakfast.   furosemide 20 MG tablet Commonly known as: LASIX Take 3 tablets (60 mg total) by mouth daily. Start taking on: August 16, 2022 What changed:  how much to take when to take this   hydrALAZINE 100 MG tablet Commonly known as: APRESOLINE Take 1 tablet (100 mg total) by mouth every 8 (eight) hours. What changed:  medication strength how much to take   INSULIN SYRINGE .5CC/28G 28G X 1/2" 0.5 ML Misc Please provide 1 month supply   isosorbide mononitrate 30 MG 24 hr tablet Commonly known as: IMDUR Take 3 tablets (90 mg total) by mouth daily. Start taking on: August 16, 2022 What changed:  medication strength how much to take   linaclotide 145 MCG Caps capsule Commonly known as: LINZESS Take 145 mcg by mouth daily as needed (for constipation).   meclizine 25 MG tablet Commonly known as: ANTIVERT Take 1 tablet (25 mg total) by mouth 3 (three) times daily as needed  for dizziness.   Mounjaro 7.5 MG/0.5ML Pen Generic drug: tirzepatide Inject 7.5 mg into the skin every Monday.   omeprazole 40 MG capsule Commonly known as: PRILOSEC Take 40 mg by mouth daily before breakfast.   ondansetron 4 MG disintegrating tablet Commonly known as: ZOFRAN-ODT Take 4 mg by mouth daily as needed for nausea or vomiting (DISSOLVE ORALLY).   OneTouch Delica Lancets 33G Misc Use to check blood sugar 3 times daily. Dx codes: E11.8, E11.65   OneTouch Verio Flex System w/Device Kit Use in the morning, at noon, and at bedtime.   OneTouch Verio test strip Generic drug: glucose blood Use as instructed to check blood sugar 3 times daily. Dx codes: E11.8, E11.65   oxyCODONE-acetaminophen 10-325 MG tablet Commonly known as: PERCOCET Take 1 tablet by mouth 3 (three) times daily.   Pen Needles 3/16" 31G X 5  MM Misc 1 Device by Does not apply route 4 (four) times daily - after meals and at bedtime.   promethazine 25 MG tablet Commonly known as: PHENERGAN Take 25 mg by mouth every 6 (six) hours as needed for nausea or vomiting.   senna 8.6 MG Tabs tablet Commonly known as: SENOKOT Take 1 tablet (8.6 mg total) by mouth 2 (two) times daily. What changed:  when to take this reasons to take this        Follow-up Information     [facility]. Go in 16 day(s).   Specialty: Cardiology Why: Hospital follow up 08/28/2022 !:30 pm PLEASE bring a current medication list to appointment FREE valet parking, Entrance C, off [employer] information: [address] [id number] [place] [address] [phone]        Bettey Costa, NP Follow up in 1 week(s).   Specialty: Nurse Practitioner Contact information: [address] [place] [address] [phone]         Annie Sable, MD Follow up in 1 week(s).   Specialty: Nephrology Contact information: [address] [place] [address] [phone]                Allergies  Allergen Reactions   Morphine And Related Itching and Swelling   Zestril [Lisinopril] Nausea Only and Swelling    The results of significant diagnostics from this hospitalization (including imaging, microbiology, ancillary and laboratory) are listed below for reference.    Microbiology: Recent Results (from the past 240 hour(s))  MRSA Next Gen by PCR, Nasal     Status: Abnormal   Collection Time: 08/08/22  7:24 PM   Specimen: Nasal Mucosa; Nasal Swab  Result Value Ref Range Status   MRSA by PCR Next Gen DETECTED (A) NOT DETECTED Final    Comment: RESULT CALLED TO, READ BACK BY AND VERIFIED WITH: L STEVENSON,RN@2152  08/07/20 MK (NOTE) The GeneXpert MRSA Assay (FDA approved for NASAL specimens only), is one component of a comprehensive MRSA colonization surveillance program. It is not intended to diagnose MRSA infection nor to guide or monitor treatment for MRSA infections. Test performance is not FDA approved in patients less than 62 years old. Performed at [facility], [address] [place], [place] [address]     Procedures/Studies: CARDIAC CATHETERIZATION  Result Date: 08/14/2022 Images from the original result were not included. Leighanne Adolph Huge is a 47 y.o. female  [id number] LOCATION:  FACILITY: [facility] PHYSICIAN: [name], M.D. [birth year] DATE OF PROCEDURE:  08/14/2022 DATE OF DISCHARGE: CARDIAC CATHETERIZATION History obtained from chart review.47 y.o. 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 Found to have CHF and hyperkalemia. Reported adherence with her medicines.  Patient was referred for right heart cath to determine her volume status. HEMODYNAMICS: 1: Right atrial pressure-15/14, mean 10 2: Right ventricular pressure-47/13 3: Pulmonary pressure-50/17, mean 33 4: Pulmonary wedge pressure-A-wave 24, V wave 26, mean 16 5: Cardiac output-9.9 L/min with an index of 4.7 L/min/m    Ms. Daniele has a history of remote CAD and moderate LV dysfunction with chronic  renal insufficiency.  She was admitted with symptoms of congestive heart failure.  She was referred for right heart cath to determine her volume status.  Her right atrial pressure was mildly elevated.  Her wedge pressure is mildly elevated.  She has mild to moderate pulm hypertension.  I suspect she remains mildly volume overloaded based on these numbers.  The sheath was removed and a pressure dressing was placed on the right antecubital fossa.  The patient left lab in stable condition. [name]. MD, [facility] 08/14/2022 9:44 AM    US RENAL  Result Date: 08/11/2022 CLINICAL DATA:  Acute kidney injury. EXAM: RENAL / URINARY TRACT ULTRASOUND COMPLETE COMPARISON:  Renal ultrasound 05/22/2022 FINDINGS: Right Kidney: Renal measurements: 9.0 x 5.1 x 5.3 cm = volume: 128 mL. Mildly increased parenchymal echogenicity. No mass or hydronephrosis visualized. Left Kidney: Renal measurements: 10.4 x 6.0 x 5.7 cm = volume: 187 mL. Mildly increased parenchymal echogenicity. No mass or hydronephrosis visualized. Bladder: Appears normal for degree of bladder distention. Other: None. IMPRESSION: Echogenic kidneys compatible with medical renal disease. No hydronephrosis. Electronically Signed   By: Sebastian Ache M.D.   On: 08/11/2022 15:27   VAS US RENAL ARTERY DUPLEX  Result Date: 08/09/2022 ABDOMINAL VISCERAL Patient Name:  KEYARAH MCROY Reha  Date of Exam:   08/09/2022 Medical Rec #:  [id number]         Accession #:    [id number] Date of Birth: 1975/09/04         Patient Gender: F Patient Age:   47 years Exam Location:  [facility] Procedure:      VAS US RENAL ARTERY DUPLEX Referring Phys: [id number] LINDSAY B ROBERTS -------------------------------------------------------------------------------- Indications: Hypertension High Risk Factors: Hypertension, hyperlipidemia, Diabetes, past history of                    smoking, coronary artery disease, prior CVA. Other Factors: CHF, CKD4. Limitations: Obesity, air/bowel gas and respiratory variation, positioning. Comparison Study: No previous exams Performing Technologist: Jody Hill RVT, RDMS  Examination Guidelines: A complete evaluation includes B-mode imaging, spectral Doppler, color Doppler, and power Doppler as needed of all accessible portions of each vessel. Bilateral testing is considered an integral part of a complete examination. Limited examinations for reoccurring indications may be performed as noted.  Duplex Findings: +--------------------+--------+--------+------+--------+ Mesenteric          PSV cm/sEDV cm/sPlaqueComments +--------------------+--------+--------+------+--------+ Aorta Prox            108      0                   +--------------------+--------+--------+------+--------+ Celiac Artery Origin  117                          +--------------------+--------+--------+------+--------+ SMA Origin            220      20                  +--------------------+--------+--------+------+--------+    +------------------+--------+--------+-------+ Right Renal ArteryPSV cm/sEDV cm/sComment +------------------+--------+--------+-------+ Origin               82      13           +------------------+--------+--------+-------+ Proximal             72      12           +------------------+--------+--------+-------+ Mid  54      14            +------------------+--------+--------+-------+ Distal               53      12           +------------------+--------+--------+-------+ +-----------------+--------+--------+-------+ Left Renal ArteryPSV cm/sEDV cm/sComment +-----------------+--------+--------+-------+ Origin              76      12           +-----------------+--------+--------+-------+ Proximal            62      9            +-----------------+--------+--------+-------+ Mid                 56      10           +-----------------+--------+--------+-------+ Distal              49      8            +-----------------+--------+--------+-------+ +------------+--------+--------+----+-----------+--------+--------+----+ Right KidneyPSV cm/sEDV cm/sRI  Left KidneyPSV cm/sEDV cm/sRI   +------------+--------+--------+----+-----------+--------+--------+----+ Upper Pole                      Upper Pole                      +------------+--------+--------+----+-----------+--------+--------+----+ Mid         22      4       0.        13      0       0.98 +------------+--------+--------+----+-----------+--------+--------+----+ Lower Pole                      Lower Pole 11      0       1.00 +------------+--------+--------+----+-----------+--------+--------+----+ Hilar       33      6       0.82Hilar      28      5       0.83 +------------+--------+--------+----+-----------+--------+--------+----+ +------------------+-----+------------------+----------------+ Right Kidney           Left Kidney                        +------------------+-----+------------------+----------------+ RAR                    RAR                                +------------------+-----+------------------+----------------+ RAR (manual)      0.76 RAR (manual)      0.70             +------------------+-----+------------------+----------------+ Cortex                 Cortex            9.0 / 2.2  (0.75) +------------------+-----+------------------+----------------+ Cortex thickness       Corex thickness                    +------------------+-----+------------------+----------------+ Kidney length (cm)10.10Kidney length (cm)10.40            +------------------+-----+------------------+----------------+  Summary: Renal:  Right: No evidence of right renal artery stenosis. Abnormal right  Resistive Index. Normal size right kidney. RRV flow present. Left:  No evidence of left renal artery stenosis. Abnormal left        Resisitve Index. Normal size of left kidney. LRV flow        present. Mesenteric: Areas of limited visceral study include right parenchymal flow.  *See table(s) above for measurements and observations.  Diagnosing physician: Gerarda Fraction  Electronically signed by Gerarda Fraction on 08/09/2022 at 10:32:23 PM.    Final    DG Chest 2 View  Result Date: 08/07/2022 CLINICAL DATA:  Shortness of breath and chest pain. EXAM: CHEST - 2 VIEW COMPARISON:  05/18/2022 FINDINGS: Cardiomegaly is similar. Small pleural effusions with fluid in the fissures. There is vascular congestion. No convincing pulmonary edema. No confluent consolidation. No pneumothorax. IMPRESSION: Cardiomegaly with vascular congestion and small pleural effusions. Findings consistent with CHF. Electronically Signed   By: Narda Rutherford M.D.   On: 08/07/2022 22:04    Labs: BNP (last 3 results) Recent Labs    05/16/22 2004 05/31/22 1528 08/07/22 2129  BNP 131.7* 1,157.3* 2,047.2*   Basic Metabolic Panel: Recent Labs  Lab 08/11/22 0129 08/12/22 0132 08/13/22 0617 08/14/22 0737 08/14/22 0930 08/15/22 0141  NA 136 134* 136 135 139  139 134*  K 3.7 3.8 3.7 3.9 3.9  3.9 4.3  CL 106 104 105 104  --  103  CO2 --  23  GLUCOSE 201* 190* 162* 120*  --  190*  BUN 41* 46* 46* 49*  --  55*  CREATININE 3.80* 3.90* 3.54* 3.53*  --  3.75*  CALCIUM 8.2* 8.1* 8.4* 8.5*  --  8.7*  PHOS  --  4.4   --   --   --   --    Liver Function Tests: Recent Labs  Lab 08/12/22 0132  ALBUMIN 2.2*   No results for input(s): "LIPASE", "AMYLASE" in the last 168 hours. No results for input(s): "AMMONIA" in the last 168 hours. CBC: Recent Labs  Lab 08/11/22 0129 08/12/22 0132 08/13/22 0617 08/14/22 0737 08/14/22 0930 08/15/22 0141  WBC 11.2* 10.3 9.8 10.6*  --  10.6*  HGB 8.8* 8.3* 9.2* 8.7* 8.5*  8.5* 8.7*  HCT 25.3* 24.3* 26.2* 25.2* 25.0*  25.0* 25.1*  MCV 72.9* 74.5* 72.6* 73.7*  --  73.2*  PLT 364 352 387 381  --  390   Cardiac Enzymes: No results for input(s): "CKTOTAL", "CKMB", "CKMBINDEX", "TROPONINI" in the last 168 hours. BNP: Invalid input(s): "POCBNP" CBG: Recent Labs  Lab 08/14/22 1548 08/14/22 2306 08/15/22 0602 08/15/22 0800 08/15/22 1206  GLUCAP 196* 166* 152* 164* 172*   D-Dimer No results for input(s): "DDIMER" in the last 72 hours. Hgb A1c No results for input(s): "HGBA1C" in the last 72 hours. Lipid Profile No results for input(s): "CHOL", "HDL", "LDLCALC", "TRIG", "CHOLHDL", "LDLDIRECT" in the last 72 hours. Thyroid function studies No results for input(s): "TSH", "T4TOTAL", "T3FREE", "THYROIDAB" in the last 72 hours.  Invalid input(s): "FREET3" Anemia work up No results for input(s): "VITAMINB12", "FOLATE", "FERRITIN", "TIBC", "IRON", "RETICCTPCT" in the last 72 hours. Urinalysis    Component Value Date/Time   COLORURINE STRAW (A) 08/11/2022 1533   APPEARANCEUR CLEAR 08/11/2022 1533   LABSPEC 1.007 08/11/2022 1533   PHURINE 5.0 08/11/2022 1533   GLUCOSEU >=500 (A) 08/11/2022 1533   GLUCOSEU > 1000 mg/dL (A) 40/98/1191 4782   HGBUR NEGATIVE 08/11/2022 1533   HGBUR negative 08/12/2008 1444   BILIRUBINUR NEGATIVE 08/11/2022 1533  BILIRUBINUR neg 02/07/2017 1609   KETONESUR NEGATIVE 08/11/2022 1533   PROTEINUR 100 (A) 08/11/2022 1533   UROBILINOGEN 1.0 02/07/2017 1609   UROBILINOGEN 0.2 11/11/2014 1658   NITRITE NEGATIVE 08/11/2022 1533    LEUKOCYTESUR NEGATIVE 08/11/2022 1533   Sepsis Labs Recent Labs  Lab 08/12/22 0132 08/13/22 0617 08/14/22 0737 08/15/22 0141  WBC 10.3 9.8 10.6* 10.6*   Microbiology Recent Results (from the past 240 hour(s))  MRSA Next Gen by PCR, Nasal     Status: Abnormal   Collection Time: 08/08/22  7:24 PM   Specimen: Nasal Mucosa; Nasal Swab  Result Value Ref Range Status   MRSA by PCR Next Gen DETECTED (A) NOT DETECTED Final    Comment: RESULT CALLED TO, READ BACK BY AND VERIFIED WITH: L STEVENSON,RN@2152  08/07/20 MK (NOTE) The GeneXpert MRSA Assay (FDA approved for NASAL specimens only), is one component of a comprehensive MRSA colonization surveillance program. It is not intended to diagnose MRSA infection nor to guide or monitor treatment for MRSA infections. Test performance is not FDA approved in patients less than 60 years old. Performed at [facility], [address] [place], [place] [address]      Time coordinating discharge: 35 minutes  SIGNED: Lanae Boast, MD  [facility] 08/15/2022, 1:46 PM  If 7PM-7AM, please contact night-coverage [email or web address]

## 2022-08-15 NOTE — Progress Notes (Signed)
Discharge instructions given. Patient verbalized understanding and all questions were answered.  ?

## 2022-08-17 LAB — LIPOPROTEIN A (LPA): Lipoprotein (a): 322.8 nmol/L — ABNORMAL HIGH

## 2022-08-28 ENCOUNTER — Ambulatory Visit (HOSPITAL_COMMUNITY)
Admit: 2022-08-28 | Discharge: 2022-08-28 | Disposition: A | Discharge status: Home / Self Care | Payer: Medicare HMO | Source: Ambulatory Visit | Attending: Cardiology | Admitting: Cardiology

## 2022-08-28 VITALS — BP 158/89 | HR 91 | Wt 229.0 lb

## 2022-08-28 DIAGNOSIS — E1143 Type 2 diabetes mellitus with diabetic autonomic (poly)neuropathy: Secondary | ICD-10-CM | POA: Insufficient documentation

## 2022-08-28 DIAGNOSIS — I2510 Atherosclerotic heart disease of native coronary artery without angina pectoris: Secondary | ICD-10-CM | POA: Diagnosis not present

## 2022-08-28 DIAGNOSIS — Z8673 Personal history of transient ischemic attack (TIA), and cerebral infarction without residual deficits: Secondary | ICD-10-CM | POA: Insufficient documentation

## 2022-08-28 DIAGNOSIS — I10 Essential (primary) hypertension: Secondary | ICD-10-CM | POA: Diagnosis not present

## 2022-08-28 DIAGNOSIS — R0683 Snoring: Secondary | ICD-10-CM | POA: Insufficient documentation

## 2022-08-28 DIAGNOSIS — N184 Chronic kidney disease, stage 4 (severe): Secondary | ICD-10-CM | POA: Insufficient documentation

## 2022-08-28 DIAGNOSIS — I130 Hypertensive heart and chronic kidney disease with heart failure and stage 1 through stage 4 chronic kidney disease, or unspecified chronic kidney disease: Secondary | ICD-10-CM | POA: Insufficient documentation

## 2022-08-28 DIAGNOSIS — E1122 Type 2 diabetes mellitus with diabetic chronic kidney disease: Secondary | ICD-10-CM | POA: Insufficient documentation

## 2022-08-28 DIAGNOSIS — E1142 Type 2 diabetes mellitus with diabetic polyneuropathy: Secondary | ICD-10-CM | POA: Insufficient documentation

## 2022-08-28 DIAGNOSIS — I5022 Chronic systolic (congestive) heart failure: Secondary | ICD-10-CM | POA: Diagnosis not present

## 2022-08-28 DIAGNOSIS — Z79899 Other long term (current) drug therapy: Secondary | ICD-10-CM | POA: Diagnosis not present

## 2022-08-28 DIAGNOSIS — Z8774 Personal history of (corrected) congenital malformations of heart and circulatory system: Secondary | ICD-10-CM | POA: Insufficient documentation

## 2022-08-28 DIAGNOSIS — I252 Old myocardial infarction: Secondary | ICD-10-CM | POA: Diagnosis not present

## 2022-08-28 LAB — BASIC METABOLIC PANEL
Anion gap: 8 (ref 5–15)
BUN: 41 mg/dL — ABNORMAL HIGH (ref 6–20)
CO2: 19 mmol/L — ABNORMAL LOW (ref 22–32)
Calcium: 8.8 mg/dL — ABNORMAL LOW (ref 8.9–10.3)
Chloride: 111 mmol/L (ref 98–111)
Creatinine, Ser: 2.69 mg/dL — ABNORMAL HIGH (ref 0.44–1.00)
GFR, Estimated: 21 mL/min — ABNORMAL LOW (ref >60)
Glucose, Bld: 122 mg/dL — ABNORMAL HIGH (ref 70–99)
Potassium: 4.1 mmol/L (ref 3.5–5.1)
Sodium: 138 mmol/L (ref 135–145)

## 2022-08-28 LAB — BRAIN NATRIURETIC PEPTIDE: B Natriuretic Peptide: 1447 pg/mL — ABNORMAL HIGH (ref 0.0–100.0)

## 2022-08-28 MED ORDER — HYDRALAZINE HCL 100 MG PO TABS: 100.0000 mg | ORAL_TABLET | Freq: Three times a day (TID) | ORAL | 3 refills | Status: AC

## 2022-08-28 MED ORDER — ISOSORBIDE MONONITRATE ER 30 MG PO TB24: 90.0000 mg | ORAL_TABLET | Freq: Every day | ORAL | 3 refills | Status: DC

## 2022-08-28 MED ORDER — AMLODIPINE BESYLATE 10 MG PO TABS: 10.0000 mg | ORAL_TABLET | Freq: Every day | ORAL | 3 refills | Status: DC

## 2022-08-28 MED ORDER — ATORVASTATIN CALCIUM 40 MG PO TABS: 40.0000 mg | ORAL_TABLET | Freq: Every day | ORAL | 3 refills | Status: DC

## 2022-08-28 MED ORDER — FUROSEMIDE 20 MG PO TABS: 60.0000 mg | ORAL_TABLET | Freq: Two times a day (BID) | ORAL | 3 refills | Status: DC

## 2022-08-28 MED ORDER — APIXABAN 5 MG PO TABS: 5.0000 mg | ORAL_TABLET | Freq: Two times a day (BID) | ORAL | 11 refills | Status: DC

## 2022-08-28 MED ORDER — CARVEDILOL 25 MG PO TABS: 25.0000 mg | ORAL_TABLET | Freq: Two times a day (BID) | ORAL | 3 refills | Status: AC

## 2022-08-28 NOTE — Addendum Note (Signed)
Encounter addended by: Allayne Butcher, PA-C on: 08/28/2022 2:22 PM  Actions taken: Clinical Note Signed

## 2022-08-28 NOTE — Patient Instructions (Signed)
CHANGE Lasix to 60 mg Twice daily  Labs done today, your results will be available in MyChart, we will contact you for abnormal readings.  You have been referred to Dr. Duke Salvia at the [facility]. Her office will call you to arrange your appointment.  Thank you for allowing [place] to provider your heart failure care after your recent hospitalization. Please follow-up with 2 weeks  If you have any questions, issues, or concerns before your next appointment please call our office at [phone], opt. 2 and leave a message for the triage nurse.

## 2022-08-28 NOTE — Progress Notes (Addendum)
HEART & VASCULAR TRANSITION OF CARE PROGRESS NOTE     Referring Physician: Dr. Ella Jubilee  Primary Care: Bettey Costa, NP  Primary Cardiologist: Kristeen Miss, MD  HPI: Referred to clinic by Dr. Ella Jubilee for heart failure consultation.   47 y/o AAF w/ H/o CAD w/ remote MI in 2008 treated w/ PDA angioplasty (no stent). Cath report not on file. H/o CVA, PE/DVT, HTN, IDA, untreated OSA, Type 2DM and CKD IV.  Recent admit 1/24 for dyspnea and LEE. Found to be in acute HF and w/ nonoliguric AKI on CKD. Hypertensive on admit 190s/110s . SCr elevated at 3.12 (b/l ~2.4). Echo showed mildly reduced LVEF 45-50%. RV ok. She was diuresed w/ IV Lasix and started on antihypertensives. Nephrology was consulted. Felt to have b/l diabetic nephropathy c/b cardiorenal syndrome. She diuresed well w/ IV Lasix but SCr plateau ~3, felt to be new baseline. Nephrology transitioned to PO Lasix, 60 mg daily, and noted to trial SGLT2i as outpatient if renal fx remained stable. Other GDMT limited by CKD. Placed on Coreg, Hydralazine, Imdur and amlodipine. D/ced on 1/30 and referred to [facility] clinic w/ plans to f/u w/ nephrology as outpatient.    At initial [facility] visit 2/24 she remained hypertensive, BP was 180/102, despite reported full med compliance. Also was volume up on exam and f/u labs showed stable SCr. Antihypertensive regimen was titrated w/ increase in hydralazine to 50 mg tid and imdur to 60 mg daily. Farxiga 10 mg was also added. Unfortunately, she was lost to f/u and did not return back as directed.   She was readmitted 4/24 for a/c CHF w/ volume overload. Also w/ AKI on CKD. SCr was up to 3.9 (prior baseline ~2.7). Marcelline Deist was discontinued. She was diuresed w/ IV Lasix. Nephrology and cardiology team consulted. Renal artery dopplers were negative for RAS.  RHC 08/14/22 showed RA mean 10, RV 47/13, PA 50/17 mean 33, wedge mean 16, CO 9.9L/min with index 4.7L/min/m2. After diuresis, she was transitioned back to PO  Lasix., 60 mg daily. Continue on coreg 25mg  BID, hydralazine 100mg  Q8H, imdur 90mg , not candidate for ARNI/ACEI/ARB/MRA/SGLT2I due to advanced kidney disease. Referred back to [facility] clinic. D/c wt 224 lb.   She presents today for post hospital f/u. Wt today 229 lb.  She continues w/ volume overload w/ 1+ b/l LEE. Denies resting dyspnea but reports NYHA Class II-III symptoms. Says she saw nephrology last wk and lasix was increased to 60 mg qam and 40 mg qpm. She reports full compliance w/ regimen. BP also remains elevated, 158/89. She reports she is watching sodium and fluid intake. She reports h/o snoring but has not had sleep study.    Cardiac Testing  Echo 11/23 1. Left ventricular ejection fraction, by estimation, is 45 to 50%. The  left ventricle has mildly decreased function. The left ventricle  demonstrates global hypokinesis. There is mild concentric left ventricular  hypertrophy. Indeterminate diastolic  filling due to E-A fusion.   2. Right ventricular systolic function is normal. The right ventricular  size is normal. There is mildly elevated pulmonary artery systolic  pressure. The estimated right ventricular systolic pressure is 35.9 mmHg.   3. Left atrial size was moderately dilated.   4. A small pericardial effusion is present. The pericardial effusion is  circumferential.   5. The mitral valve is normal in structure. Mild to moderate mitral valve  regurgitation.   6. Tricuspid valve regurgitation is mild to moderate.   7. The aortic valve  is tricuspid. Aortic valve regurgitation is not  visualized. No aortic stenosis is present.    RHC HEMODYNAMICS:    1: Right atrial pressure-15/14, mean 10 2: Right ventricular pressure-47/13 3: Pulmonary pressure-50/17, mean 33 4: Pulmonary wedge pressure-A-wave 24, V wave 26, mean 16 5: Cardiac output-9.9 L/min with an index of 4.7 L/min/m   Review of Systems: [y] = yes, [ ]  = no   General: Weight gain [ Y]; Weight loss [ ] ;  Anorexia [ ] ; Fatigue [ ] ; Fever [ ] ; Chills [ ] ; Weakness [ ]   Cardiac: Chest pain/pressure [ ] ; Resting SOB [ ] ; Exertional SOB [Y ]; Orthopnea [ ] ; Pedal Edema [ Y]; Palpitations [ ] ; Syncope [ ] ; Presyncope [ ] ; Paroxysmal nocturnal dyspnea[ ]   Pulmonary: Cough [ ] ; Wheezing[ ] ; Hemoptysis[ ] ; Sputum [ ] ; Snoring [ ]   GI: Vomiting[ ] ; Dysphagia[ ] ; Melena[ ] ; Hematochezia [ ] ; Heartburn[ ] ; Abdominal pain [ ] ; Constipation [ ] ; Diarrhea [ ] ; BRBPR [ ]   GU: Hematuria[ ] ; Dysuria [ ] ; Nocturia[ ]   Vascular: Pain in legs with walking [ ] ; Pain in feet with lying flat [ ] ; Non-healing sores [ ] ; Stroke [ ] ; TIA [ ] ; Slurred speech [ ] ;  Neuro: Headaches[ ] ; Vertigo[ ] ; Seizures[ ] ; Paresthesias[ ] ;Blurred vision [ ] ; Diplopia [ ] ; Vision changes [ ]   Ortho/Skin: Arthritis [ ] ; Joint pain [ ] ; Muscle pain [ ] ; Joint swelling [ ] ; Back Pain [ ] ; Rash [ ]   Psych: Depression[ ] ; Anxiety[ ]   Heme: Bleeding problems [ ] ; Clotting disorders [ ] ; Anemia [ ]   Endocrine: Diabetes [Y ]; Thyroid dysfunction[ ]    Past Medical History:  Diagnosis Date   Abscess of tunica vaginalis    10/09- Abundant S. aureus- sensitive to all abx   Anxiety    Blood dyscrasia    CAD (coronary artery disease) 06/15/2006   s/p Subendocardial MI with PDA angioplasty(no stent) on 06/15/06 and relook  cath 06/19/06 showed patency of site. Cath 12/10- no restenosis or significant CAD progression   CVA (cerebral vascular accident) (HCC) ~ 02/2014   denies residual on 04/22/2014   CVA (cerebral vascular accident) [facility])    history of remote right cerebellar infarct noted on head CT at least since 10/2011   Depression    Diabetes mellitus type 2, uncontrolled, with complications    Fibromyalgia    Gastritis    Gastroparesis    secondary to poorly controlled DM, last emptying study performed 01/2010  was normal but may be falsely positive as pt was on reglan   GERD (gastroesophageal reflux disease)    Hepatitis B, chronic (HCC)     Hep BeAb+,Hep B cAb+ & Hep BsAg+ (9/06)   History of pyelonephritis    H/o GrpB Pyelonephritis (9/06) and UTI- 07/11- E.Coli, 12/10- GBS   Hyperlipidemia    Hypertension    Iron deficiency anemia    Irregular menses    Small ovarian follicles seen on CT(9/06)   MI (myocardial infarction) (HCC) 05/2006   PDA percutaneous transluminal coronary angioplasty   Migraine    "weekly" (04/22/2014)   N&V (nausea and vomiting)    Chronic. Unclear etiology with multiple admission and ED visits. CT abdomen with and without contrast (02/2011)  showed no acute process. Gastic Emptying scan (01/2010) was normal. Ultrasound of the abdomen was within normal limits. Hepatitis B viral load was undectable. HIV NR. EGD - gastritis, Hpylori + s/p Rx   New onset of congestive heart failure (  HCC) 05/16/2022   Obesity    OSA (obstructive sleep apnea)    "suppose to wear mask but I don't" (04/22/2014)   Peripheral neuropathy    Pneumonia    "this is probably the 2nd or 3rd time I've had pneumonia" (04/22/2014)   Recurrent boils    Seasonal asthma    Substance abuse (HCC)    Thrombocytosis    Hem/Onc suggested 2/2 chronic hepatits and/or iron deficiency anemia    Current Outpatient Medications  Medication Sig Dispense Refill   albuterol (VENTOLIN HFA) 108 (90 Base) MCG/ACT inhaler Inhale 2 puffs into the lungs every 4 (four) hours as needed for wheezing or shortness of breath. 8 g 1   amLODipine (NORVASC) 10 MG tablet Take 1 tablet (10 mg total) by mouth daily. 30 tablet 0   apixaban (ELIQUIS) 5 MG TABS tablet Take 1 tablet (5 mg total) by mouth 2 (two) times daily. 60 tablet 0   atorvastatin (LIPITOR) 40 MG tablet Take 1 tablet (40 mg total) by mouth daily. 30 tablet 0   Blood Glucose Monitoring Suppl (ONETOUCH VERIO FLEX SYSTEM) w/Device KIT Use in the morning, at noon, and at bedtime. 1 kit 0   carvedilol (COREG) 25 MG tablet Take 1 tablet (25 mg total) by mouth 2 (two) times daily with a meal. 60 tablet  0   cyclobenzaprine (FLEXERIL) 10 MG tablet Take 10 mg by mouth 3 (three) times daily as needed for muscle spasms.     dicyclomine (BENTYL) 10 MG capsule Take 1 capsule (10 mg total) by mouth 4 (four) times daily -  before meals and at bedtime. 20 capsule 0   diphenhydrAMINE (BENADRYL) 25 MG tablet Take 25 mg by mouth every 6 (six) hours as needed for allergies.     famotidine (PEPCID) 20 MG tablet Take 20 mg by mouth at bedtime.     ferrous sulfate 325 (65 FE) MG tablet Take 325 mg by mouth daily with breakfast.     glucose blood (ONETOUCH VERIO) test strip Use as instructed to check blood sugar 3 times daily. Dx codes: E11.8, E11.65 100 each 11   hydrALAZINE (APRESOLINE) 100 MG tablet Take 1 tablet (100 mg total) by mouth every 8 (eight) hours. 90 tablet 0   Insulin Pen Needle (PEN NEEDLES 3/16") 31G X 5 MM MISC 1 Device by Does not apply route 4 (four) times daily - after meals and at bedtime. 100 each 5   INSULIN SYRINGE .5CC/28G (INS SYRINGE/NEEDLE .5CC/28G) 28G X 1/2" 0.5 ML MISC Please provide 1 month supply 100 each 0   isosorbide mononitrate (IMDUR) 30 MG 24 hr tablet Take 3 tablets (90 mg total) by mouth daily. 90 tablet 0   linaclotide (LINZESS) 145 MCG CAPS capsule Take 145 mcg by mouth daily as needed (for constipation).     meclizine (ANTIVERT) 25 MG tablet Take 1 tablet (25 mg total) by mouth 3 (three) times daily as needed for dizziness. 30 tablet 0   MOUNJARO 7.5 MG/0.5ML Pen Inject 7.5 mg into the skin every Monday.     omeprazole (PRILOSEC) 40 MG capsule Take 40 mg by mouth daily before breakfast.     OneTouch Delica Lancets 33G MISC Use to check blood sugar 3 times daily. Dx codes: E11.8, E11.65 100 each 11   oxyCODONE-acetaminophen (PERCOCET) 10-325 MG tablet Take 1 tablet by mouth 3 (three) times daily.     senna (SENOKOT) 8.6 MG TABS tablet Take 1 tablet (8.6 mg total) by mouth 2 (two)  times daily. 30 tablet 0   aspirin 81 MG chewable tablet Chew 81 mg by mouth daily.  (Patient not taking: Reported on 08/28/2022)     furosemide (LASIX) 20 MG tablet Take 3 tablets (60 mg total) by mouth 2 (two) times daily. 180 tablet 3   No current facility-administered medications for this encounter.    Allergies  Allergen Reactions   Morphine And Related Itching and Swelling   Zestril [Lisinopril] Nausea Only and Swelling      Social History   Socioeconomic History   Marital status: Single    Spouse name: Not on file   Number of children: 2   Years of education: 43   Highest education level: Not on file  Occupational History   Occupation: other    Comment: unemployed    Comment: disability  Tobacco Use   Smoking status: Former    Types: Cigarettes    Quit date: 04/24/1996    Years since quitting: 26.3   Smokeless tobacco: Never   Tobacco comments:    quit smoking cigarettes age 26  Vaping Use   Vaping Use: Never used  Substance and Sexual Activity   Alcohol use: No    Alcohol/week: 0.0 standard drinks of alcohol    Comment: 04/22/2014 "might have a few drinks a month"   Drug use: Not Currently    Types: Marijuana, Cocaine    Comment: 04/22/2104 "quit drugs ~ 1-2 yr ago"   Sexual activity: Yes    Birth control/protection: None  Other Topics Concern   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         Social Determinants of Health   Financial Resource Strain: Low Risk  (08/09/2022)   Overall Financial Resource Strain (CARDIA)    Difficulty of Paying Living Expenses: Not hard at all  Food Insecurity: No Food Insecurity (08/08/2022)   Hunger Vital Sign    Worried About Running Out of Food in the Last Year: Never true    Ran Out of Food in the Last Year: Never true  Transportation Needs: No Transportation Needs (08/08/2022)   PRAPARE - Administrator, Civil Service (Medical): No    Lack  of Transportation (Non-Medical): No  Physical Activity: Not on file  Stress: Not on file  Social Connections: Not on file  Intimate Partner Violence: Not At Risk (08/08/2022)   Humiliation, Afraid, Rape, and Kick questionnaire    Fear of Current or Ex-Partner: No    Emotionally Abused: No    Physically Abused: No    Sexually Abused: No      Family History  Problem Relation Age of Onset   Diabetes Father    Healthy Mother     Vitals:   08/28/22 1345  BP: (!) 158/89  Pulse: 91  SpO2: 96%  Weight: 103.9 kg (229 lb)     PHYSICAL EXAM: General:  Well appearing. No respiratory difficulty HEENT: normal Neck: supple. JVD 9 cm Carotids 2+ bilat; no bruits. No lymphadenopathy or thyromegaly appreciated. Cor: PMI nondisplaced. Regular rate & rhythm. No rubs, gallops or murmurs. Lungs: decreased BS at the bases bilaterally  Abdomen: soft, nontender, nondistended. No hepatosplenomegaly. No bruits or masses. Good bowel sounds. Extremities: no cyanosis, clubbing, rash, 1+ b/l LE pretibial edema Neuro: alert & oriented x 3, cranial nerves grossly  intact. moves all 4 extremities w/o difficulty. Affect pleasant.   ECG: NSR 91 bpm w/ PVCs    ASSESSMENT & PLAN:  1. Chronic Systolic Heart Failure - Echo 16/10 EF 45-50%, RV ok  - RHC 08/14/22 showed RA mean 10, RV 47/13, PA 50/17 mean 33, wedge mean 16, CO 9.9L/min with index 4.7L/min/m2 - NYHA Class II-early III. Wt up 5 lb since d/c. + LEE on exam  - Increase Lasix to 60 mg bid - SGLT2i discontinued per nephrology recommendations (Significant Scr bump w/ Marcelline Deist) - GDMT limited by CKD IV. No ARNi or MRA - continue coreg 25 mg bid  - continue hydralazine 100 mg tid - continue imdur 90 mg daily  - discussed sodium and fluid restriction  - suspect underlying OSA. Needs sleep study    2. Hypertension  - uncontrolled, despite reported full compliance - RA dopplers 4/24 negative for RAS  - suspect OSA contributing. Will need repeat  OSA evaluation/sleep study for CPAP (will need to be ordered by primary cardiology team) - refer to HTN clinic   3. CKD IV - followed by [facility] - suspect diabetic + hypertensive nephropathy  - new baseline SCr ~3.0  - repeat BMP today   4. Fatigue/ Snoring  - suspect OSA - needs sleep study, will need to be ordered by primary cardiology team    Referred to HFSW (PCP, Medications, Transportation, ETOH Abuse, Drug Abuse, Insurance, Financial ): No Refer to Pharmacy: Yes  Refer to Home Health: No Refer to Advanced Heart Failure Clinic: No  Refer to General Cardiology: Yes (Dr. Melburn Popper)   Follow up  return to [facility] clinic in 2 wks to reassess volume status, then back to cardiology for further management

## 2022-08-29 ENCOUNTER — Other Ambulatory Visit (HOSPITAL_COMMUNITY): Payer: Self-pay

## 2022-09-07 NOTE — Progress Notes (Deleted)
  Cardiology Office Note:    Date:  09/07/2022  ID:  Shirley [name], DOB 06/19/1975, MRN [id number] PCP: Bettey Costa, NP  [facility] Providers Cardiologist:  Kristeen Miss, MD { Click to update primary MD,subspecialty MD or APP then REFRESH:1}    {Click to Open Review  :1}      Patient Profile:   Coronary artery disease  Hx of remote MI s/p POBA to PDA (2008) LHC 06/19/2006: EF 65, no CAD HFmrEF (heart failure with mildly reduced ejection fraction)  Pulmonary hypertension  TTE 03/13/2022: EF 45-50, global HK, mild LVH, normal RVSF, mild elevation PASP (RVSP 35.9), moderate LAE, small effusion, mild-moderate MR, mild-moderate TR Right heart catheterization 08/14/2022: Mean RA 10, RV PA 47/13, mean PA 33, mean PCWP 16, CO 9.9 S/p CVA Hx of DVT/pulmonary embolism  Chronic anticoagulation (Eliquis) Hypertension  Hyperlipidemia  `Lp(a) 323 (07/2022) Diabetes mellitus  Chronic kidney disease Renal artery [place] 08/09/2022: No RAS bilaterally Diabetic nephropathy  OSA not on CPAP Fe def anemia Chronic Hep B Hx of cocaine use        History of Present Illness:   Shirley [name] is a 47 y.o. female who returns for posthospitalization follow-up.  She was admitted 4/15-4/23 with decompensated heart failure in the setting of hypertensive urgency.  Diuresis was complicated by AKI on chronic kidney disease.  She was followed by nephrology.  AKI was thought to be due to ongoing cardiorenal syndrome +/- medication effect.  SGLT2 inhibitor was stopped.  She did have a right heart catheterization which demonstrated mildly elevated wedge pressure as well as pulmonary hypertension.  This was felt to be due to ongoing volume excess.  Troponins were mildly elevated and consistent with demand ischemia.  She was seen 08/28/2022 in the [facility] clinic.  She was volume overloaded and her furosemide was increased further to 60 mg twice daily.  Blood pressure remained uncontrolled.  It was recommended that  the patient have repeat sleep study.  She was referred to the hypertension clinic.  ***  ROS ***    Studies Reviewed:    EKG:  ***  *** Risk Assessment/Calculations:   {Does this patient have ATRIAL FIBRILLATION?:[phone]} No BP recorded.  {Refresh Note OR Click here to enter BP  :1}***       Physical Exam:   VS:  There were no vitals taken for this visit.   Wt Readings from Last 3 Encounters:  08/28/22 229 lb (103.9 kg)  08/15/22 224 lb 12.8 oz (102 kg)  05/31/22 209 lb (94.8 kg)    Physical Exam***    ASSESSMENT AND PLAN:   No problem-specific Assessment & Plan notes found for this encounter. ***    {Are you ordering a CV Procedure (e.g. stress test, cath, DCCV, TEE, etc)?   Press F2        :[id number]}  Dispo:  No follow-ups on file.  Signed, Tereso Newcomer, PA-C

## 2022-09-08 ENCOUNTER — Ambulatory Visit: Payer: Medicare HMO | Attending: Physician Assistant | Admitting: Physician Assistant

## 2022-09-08 DIAGNOSIS — I5022 Chronic systolic (congestive) heart failure: Secondary | ICD-10-CM

## 2022-09-11 ENCOUNTER — Encounter: Payer: Self-pay | Admitting: Physician Assistant

## 2022-09-14 IMAGING — DX DG CHEST 2V
2 series · 2 of 2 positions shown · non-contrast
Comparison: 11/26/2019

CLINICAL DATA: Chest pain

EXAM:
CHEST - 2 VIEW

[chest pa]
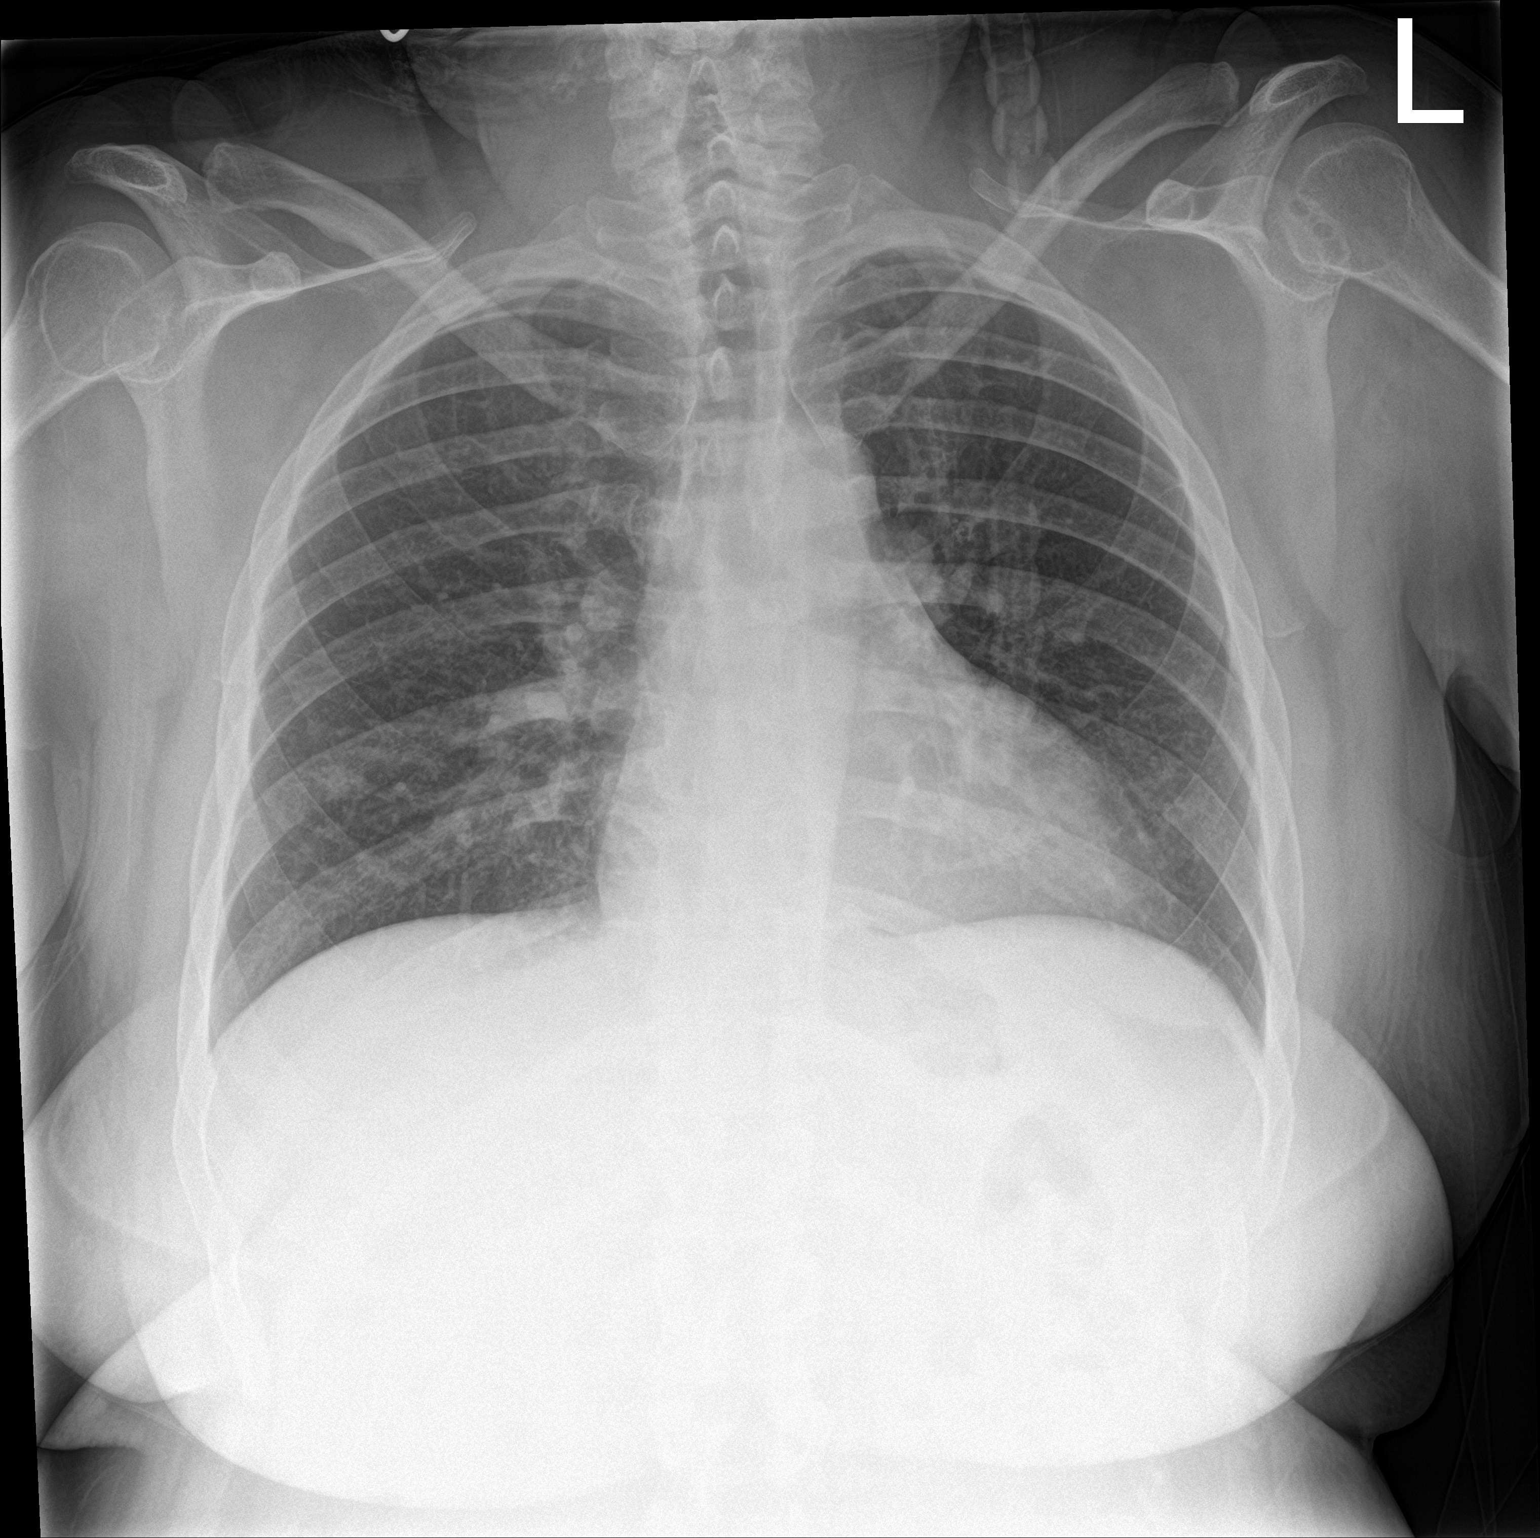

[chest lat]
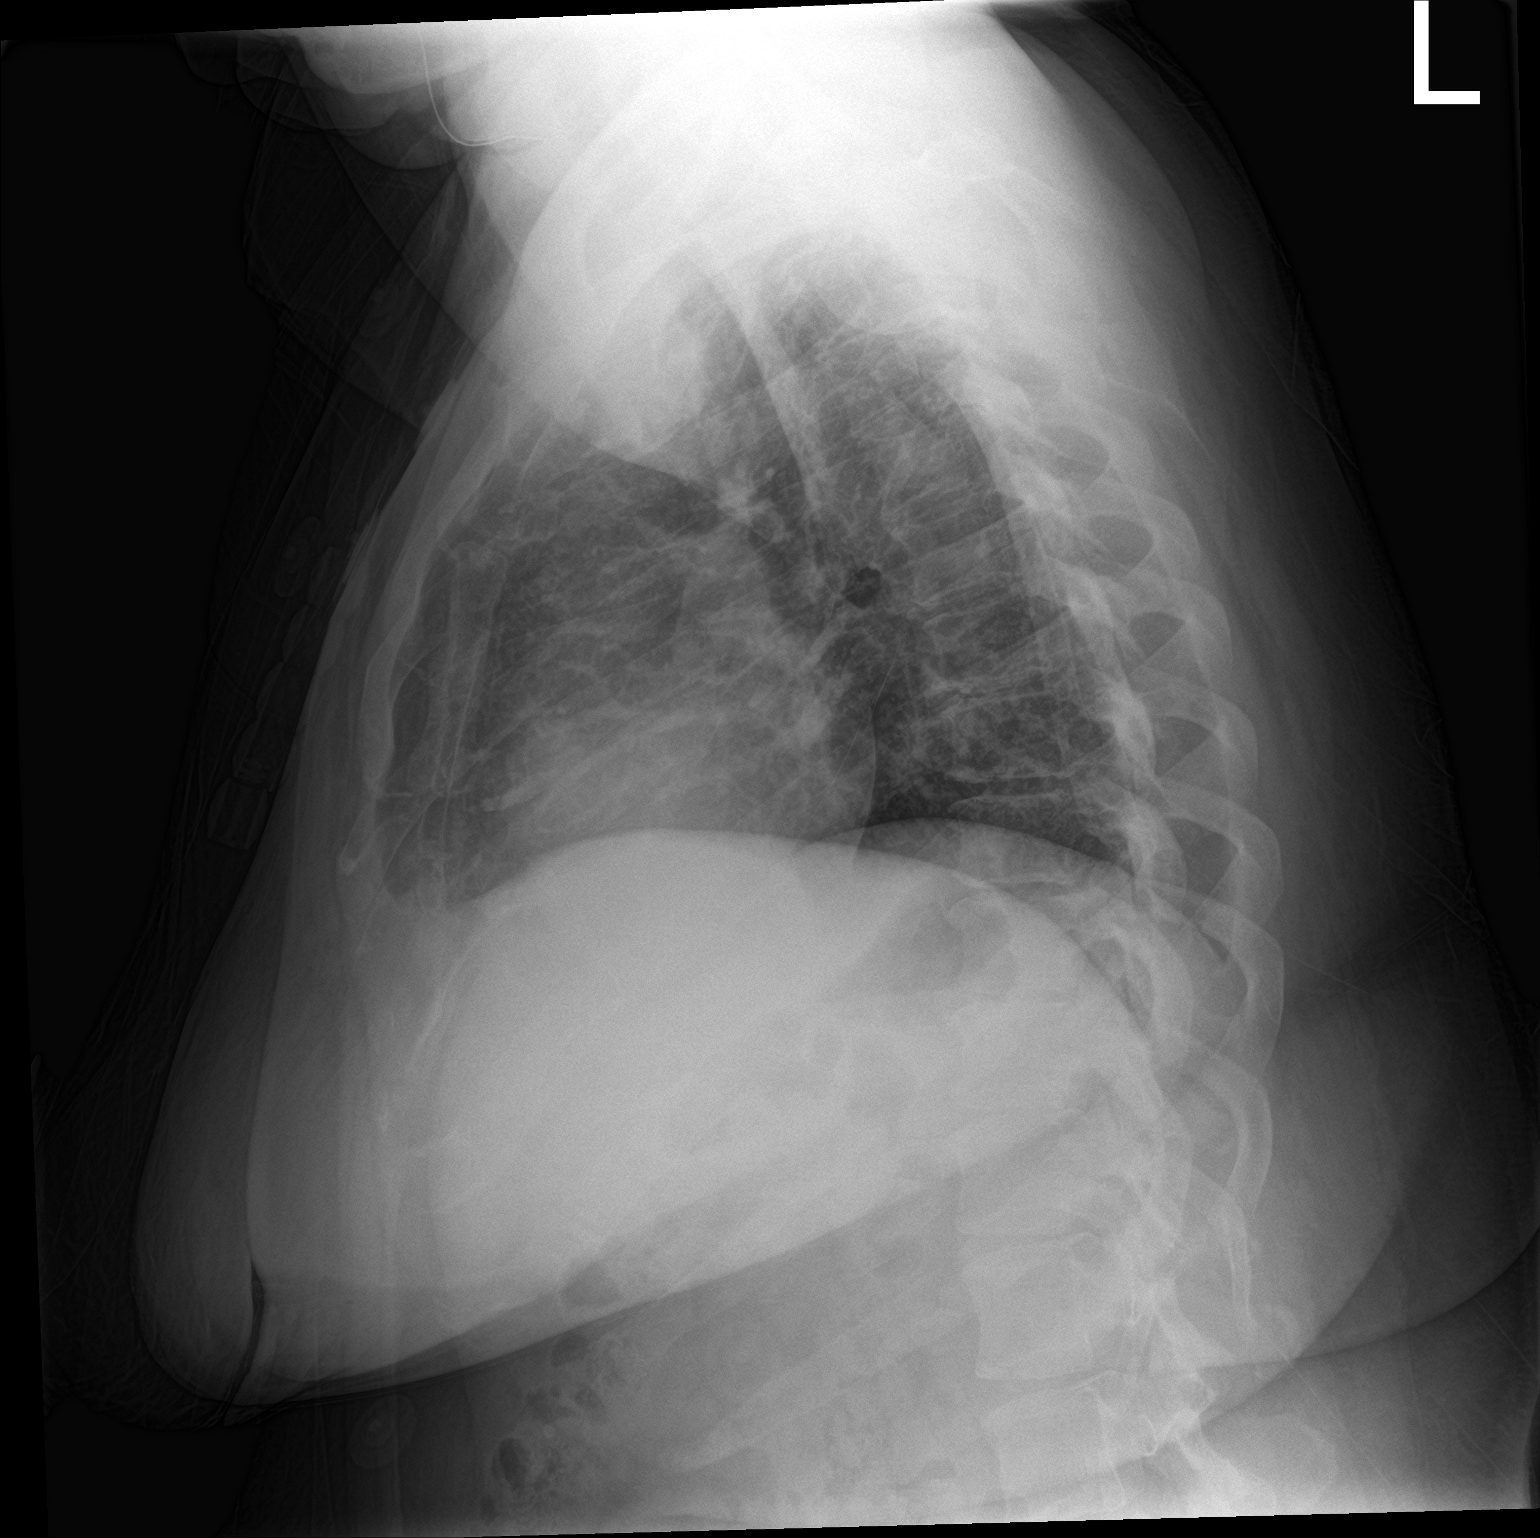

[2 of 2 positions shown; findings below may reference images not displayed]

FINDINGS: The heart size and mediastinal contours are within normal limits.
Small focal opacity within the periphery of the right mid lung. Left
lung appears clear. No pleural effusion. No pneumothorax. The
visualized skeletal structures are unremarkable.
IMPRESSION: Small focal opacity within the periphery of the right mid lung, may
represent a small infectious or inflammatory focus. Radiographic
follow-up to resolution is recommended to exclude underlying nodule.

## 2022-09-14 NOTE — Progress Notes (Signed)
HEART IMPACT TRANSITIONS OF CARE    PCP: [facility] Cardiologist: Dr Elease Hashimoto  HPI: Shirley [name] is a 47 y/o AAF with a history of  CAD w/ remote MI in 2008 treated w/ PDA angioplasty (no stent). Cath report not on file. H/o CVA, PE/DVT, HTN, IDA, untreated OSA, Type 2DM, CKD IV, and HFmEF   Recent admit 1/24 for dyspnea and LEE. Found to be in acute HF and w/ nonoliguric AKI on CKD. Hypertensive on admit 190s/110s . SCr elevated at 3.12 (b/l ~2.4). Echo showed mildly reduced LVEF 45-50%. RV ok. She was diuresed w/ IV Lasix and started on antihypertensives. Nephrology was consulted. Felt to have b/l diabetic nephropathy c/b cardiorenal syndrome. She diuresed well w/ IV Lasix but SCr plateau ~3, felt to be new baseline. Nephrology transitioned to PO Lasix, 60 mg daily, and noted to trial SGLT2i as outpatient if renal fx remained stable. Other GDMT limited by CKD. Placed on Coreg, Hydralazine, Imdur and amlodipine. D/ced on 1/30 and referred to [facility] clinic w/ plans to f/u w/ nephrology as outpatient.     At initial [facility] visit 2/24 she remained hypertensive, BP was 180/102, despite reported full med compliance. Also was volume up on exam and f/u labs showed stable SCr. Antihypertensive regimen was titrated w/ increase in hydralazine to 50 mg tid and imdur to 60 mg daily. Farxiga 10 mg was also added. Unfortunately, she was lost to f/u and did not return back as directed.    She was readmitted 4/24 for a/c CHF w/ volume overload. Also w/ AKI on CKD. SCr was up to 3.9 (prior baseline ~2.7). Shirley [name] was discontinued. She was diuresed w/ IV Lasix. Nephrology and cardiology team consulted. Renal artery dopplers were negative for RAS.  RHC 08/14/22 showed RA mean 10, RV 47/13, PA 50/17 mean 33, wedge mean 16, CO 9.9L/min with index 4.7L/min/m2. After diuresis, she was transitioned back to PO Lasix., 60 mg daily. Continue on coreg 25mg  BID, hydralazine 100mg  Q8H, imdur 90mg , not candidate for  ARNI/ACEI/ARB/MRA/SGLT2I due to advanced kidney disease. Referred back to [facility] clinic. D/c wt 224 lb.   She was seen in the HF TOC earlier this month. Volume overloaded. Lasix was increased to 60 mg twice a day. Creatinine at that time 2.7.   Today she returns for HF follow up.Overall feeling fine. Denies SOB/PND/Orthopnea. Appetite poor due to nausea.  No fever or chills. Weight at home 195  pounds. Says she didn't take any meds today because she has having nausea. She has known gastroparesis. Says she was constipated a couple days ago and took Linzess and now she has diarrhea.   Cardiac Testing  Echo 11/23 1. Left ventricular ejection fraction, by estimation, is 45 to 50%. The  left ventricle has mildly decreased function. The left ventricle  demonstrates global hypokinesis. There is mild concentric left ventricular  hypertrophy. Indeterminate diastolic  filling due to E-A fusion.   2. Right ventricular systolic function is normal. The right ventricular  size is normal. There is mildly elevated pulmonary artery systolic  pressure. The estimated right ventricular systolic pressure is 35.9 mmHg.   3. Left atrial size was moderately dilated.   4. A small pericardial effusion is present. The pericardial effusion is  circumferential.   5. The mitral valve is normal in structure. Mild to moderate mitral valve  regurgitation.   6. Tricuspid valve regurgitation is mild to moderate.   7. The aortic valve is tricuspid. Aortic valve regurgitation is not  visualized. No aortic stenosis is present.    RHC   1: Right atrial pressure-15/14, mean 10 2: Right ventricular pressure-47/13 3: Pulmonary pressure-50/17, mean 33 4: Pulmonary wedge pressure-A-wave 24, V wave 26, mean 16 5: Cardiac output-9.9 L/min with an index of 4.7 L/min/m  ROS: All systems negative except as listed in HPI, PMH and Problem List.  SH:  Social History   Socioeconomic History   Marital status: Single    Spouse name:  Not on file   Number of children: 2   Years of education: 11   Highest education level: Not on file  Occupational History   Occupation: other    Comment: unemployed    Comment: disability  Tobacco Use   Smoking status: Former    Types: Cigarettes    Quit date: 04/24/1996    Years since quitting: 26.4   Smokeless tobacco: Never   Tobacco comments:    quit smoking cigarettes age 76  Vaping Use   Vaping Use: Never used  Substance and Sexual Activity   Alcohol use: No    Alcohol/week: 0.0 standard drinks of alcohol    Comment: 04/22/2014 "might have a few drinks a month"   Drug use: Not Currently    Types: Marijuana, Cocaine    Comment: 04/22/2104 "quit drugs ~ 1-2 yr ago"   Sexual activity: Yes    Birth control/protection: None  Other Topics Concern   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         Social Determinants of Health   Financial Resource Strain: Low Risk  (08/09/2022)   Overall Financial Resource Strain (CARDIA)    Difficulty of Paying Living Expenses: Not hard at all  Food Insecurity: No Food Insecurity (08/08/2022)   Hunger Vital Sign    Worried About Running Out of Food in the Last Year: Never true    Ran Out of Food in the Last Year: Never true  Transportation Needs: No Transportation Needs (08/08/2022)   PRAPARE - Administrator, Civil Service (Medical): No    Lack of Transportation (Non-Medical): No  Physical Activity: Not on file  Stress: Not on file  Social Connections: Not on file  Intimate Partner Violence: Not At Risk (08/08/2022)   Humiliation, Afraid, Rape, and Kick questionnaire    Fear of Current or Ex-Partner: No    Emotionally Abused: No    Physically Abused: No    Sexually Abused: No    FH:  Family History  Problem Relation Age of Onset   Diabetes Father    Healthy Mother      Past Medical History:  Diagnosis Date   Abscess of tunica vaginalis    10/09- Abundant S. aureus- sensitive to all abx   Anxiety    Blood dyscrasia    CAD (coronary artery disease) 06/15/2006   s/p Subendocardial MI with PDA angioplasty(no stent) on 06/15/06 and relook  cath 06/19/06 showed patency of site. Cath 12/10- no restenosis or significant CAD progression   CVA (cerebral vascular accident) (HCC) ~ 02/2014   denies residual on 04/22/2014   CVA (cerebral vascular accident) [facility])    history of remote right cerebellar infarct noted on head CT at least since 10/2011   Depression    Diabetes mellitus type 2, uncontrolled, with complications    Fibromyalgia  Gastritis    Gastroparesis    secondary to poorly controlled DM, last emptying study performed 01/2010  was normal but may be falsely positive as pt was on reglan   GERD (gastroesophageal reflux disease)    Hepatitis B, chronic (HCC)    Hep BeAb+,Hep B cAb+ & Hep BsAg+ (9/06)   History of pyelonephritis    H/o GrpB Pyelonephritis (9/06) and UTI- 07/11- E.Coli, 12/10- GBS   Hyperlipidemia    Hypertension    Iron deficiency anemia    Irregular menses    Small ovarian follicles seen on CT(9/06)   MI (myocardial infarction) (HCC) 05/2006   PDA percutaneous transluminal coronary angioplasty   Migraine    "weekly" (04/22/2014)   N&V (nausea and vomiting)    Chronic. Unclear etiology with multiple admission and ED visits. CT abdomen with and without contrast (02/2011)  showed no acute process. Gastic Emptying scan (01/2010) was normal. Ultrasound of the abdomen was within normal limits. Hepatitis B viral load was undectable. HIV NR. EGD - gastritis, Hpylori + s/p Rx   New onset of congestive heart failure (HCC) 05/16/2022   Obesity    OSA (obstructive sleep apnea)    "suppose to wear mask but I don't" (04/22/2014)   Peripheral neuropathy    Pneumonia    "this is probably the 2nd or 3rd time I've had pneumonia" (04/22/2014)    Recurrent boils    Seasonal asthma    Substance abuse (HCC)    Thrombocytosis    Hem/Onc suggested 2/2 chronic hepatits and/or iron deficiency anemia    Current Outpatient Medications  Medication Sig Dispense Refill   albuterol (VENTOLIN HFA) 108 (90 Base) MCG/ACT inhaler Inhale 2 puffs into the lungs every 4 (four) hours as needed for wheezing or shortness of breath. 8 g 1   amLODipine (NORVASC) 10 MG tablet Take 1 tablet (10 mg total) by mouth daily. 90 tablet 3   apixaban (ELIQUIS) 5 MG TABS tablet Take 1 tablet (5 mg total) by mouth 2 (two) times daily. 60 tablet 11   aspirin 81 MG chewable tablet Chew 81 mg by mouth daily.     atorvastatin (LIPITOR) 40 MG tablet Take 1 tablet (40 mg total) by mouth daily. 90 tablet 3   Blood Glucose Monitoring Suppl (ONETOUCH VERIO FLEX SYSTEM) w/Device KIT Use in the morning, at noon, and at bedtime. 1 kit 0   carvedilol (COREG) 25 MG tablet Take 1 tablet (25 mg total) by mouth 2 (two) times daily with a meal. 180 tablet 3   cyclobenzaprine (FLEXERIL) 10 MG tablet Take 10 mg by mouth 3 (three) times daily as needed for muscle spasms.     dicyclomine (BENTYL) 10 MG capsule Take 1 capsule (10 mg total) by mouth 4 (four) times daily -  before meals and at bedtime. 20 capsule 0   diphenhydrAMINE (BENADRYL) 25 MG tablet Take 25 mg by mouth every 6 (six) hours as needed for allergies.     famotidine (PEPCID) 20 MG tablet Take 20 mg by mouth at bedtime.     ferrous sulfate 325 (65 FE) MG tablet Take 325 mg by mouth daily with breakfast.     furosemide (LASIX) 20 MG tablet Take 3 tablets (60 mg total) by mouth 2 (two) times daily. 180 tablet 3   glucose blood (ONETOUCH VERIO) test strip Use as instructed to check blood sugar 3 times daily. Dx codes: E11.8, E11.65 100 each 11   hydrALAZINE (APRESOLINE) 100 MG tablet Take 1 tablet (  100 mg total) by mouth every 8 (eight) hours. 180 tablet 3   Insulin Pen Needle (PEN NEEDLES 3/16") 31G X 5 MM MISC 1 Device by  Does not apply route 4 (four) times daily - after meals and at bedtime. 100 each 5   INSULIN SYRINGE .5CC/28G (INS SYRINGE/NEEDLE .5CC/28G) 28G X 1/2" 0.5 ML MISC Please provide 1 month supply 100 each 0   isosorbide mononitrate (IMDUR) 30 MG 24 hr tablet Take 3 tablets (90 mg total) by mouth daily. 90 tablet 3   linaclotide (LINZESS) 145 MCG CAPS capsule Take 145 mcg by mouth daily as needed (for constipation).     meclizine (ANTIVERT) 25 MG tablet Take 1 tablet (25 mg total) by mouth 3 (three) times daily as needed for dizziness. 30 tablet 0   omeprazole (PRILOSEC) 40 MG capsule Take 40 mg by mouth daily before breakfast.     OneTouch Delica Lancets 33G MISC Use to check blood sugar 3 times daily. Dx codes: E11.8, E11.65 100 each 11   oxyCODONE-acetaminophen (PERCOCET) 10-325 MG tablet Take 1 tablet by mouth 3 (three) times daily.     senna (SENOKOT) 8.6 MG TABS tablet Take 1 tablet (8.6 mg total) by mouth 2 (two) times daily. 30 tablet 0   No current facility-administered medications for this encounter.    Vitals:   09/15/22 1337  BP: (!) 160/110  Pulse: (!) 104  SpO2: 94%  Weight: 89.4 kg (197 lb 3.2 oz)   Wt Readings from Last 3 Encounters:  09/15/22 89.4 kg (197 lb 3.2 oz)  08/28/22 103.9 kg (229 lb)  08/15/22 102 kg (224 lb 12.8 oz)    PHYSICAL EXAM: General:  Walked in the clinic. No resp difficulty HEENT: normal Neck: supple. JVP flat. Carotids 2+ bilaterally; no bruits. No lymphadenopathy or thryomegaly appreciated. Cor: PMI normal. Tachy Regular rate & rhythm. No rubs, gallops or murmurs. Lungs: clear Abdomen: soft, nontender, distended. No hepatosplenomegaly. No bruits or masses. Good bowel sounds. Extremities: no cyanosis, clubbing, rash, edema Neuro: alert & orientedx3, cranial nerves grossly intact. Moves all 4 extremities w/o difficulty. Affect pleasant.  EKG: ST 105 bpm with occasional PVC  ASSESSMENT & PLAN:  1. Chronic Systolic Heart Failure - Echo 16/10 EF  45-50%, RV ok  - RHC 08/14/22 showed RA mean 10, RV 47/13, PA 50/17 mean 33, wedge mean 16, CO 9.9L/min with index 4.7L/min/m2 - NYHA II . Volume status stable. Continue lasix to 60 mg bid. CHeck BMET  - SGLT2i discontinued per nephrology recommendations (Significant Scr bump w/ Shirley [name]) - Of note she did not have her meds today due to nausea and diarrhea.  - GDMT limited by CKD IV. No ARNi or MRA - continue coreg 25 mg bid  - continue hydralazine 100 mg tid - continue imdur 90 mg daily .  - suspect underlying OSA. Needs sleep study     2. Hypertension  - - RA dopplers 4/24 negative for RAS  - suspect OSA contributing. Will need repeat OSA evaluation/sleep study for CPAP (will need to be ordered by primary cardiology team)  - She has been referred to the HTN clinic, Dr Duke Salvia and has follow up in July.  - Uncontrolled. Has not had meds today. Asked her to try and at least take hydralazine until she can get food down.   3. CKD IV - 07/2022 Renal US- no hydronephrosis.  - followed by [facility] - suspect diabetic + hypertensive nephropathy  - new baseline SCr ~3.0  4. Fatigue/ Snoring  - suspect OSA - needs sleep study, will need to be ordered by primary cardiology team   5. Gastroparesis H/O constipation. Had Linzess a couple days ago. Now with frequent diarrhea.  Had CT abd 02/2022 - no acute findings.        Check BMET . Follow up as needed.   Jisela Merlino NP-C  2:04 PM

## 2022-09-15 ENCOUNTER — Ambulatory Visit (HOSPITAL_COMMUNITY)
Admission: RE | Admit: 2022-09-15 | Discharge: 2022-09-15 | Disposition: A | Discharge status: Home / Self Care | Payer: Medicare HMO | Source: Ambulatory Visit | Attending: Adult Health | Admitting: Adult Health

## 2022-09-15 ENCOUNTER — Encounter (HOSPITAL_COMMUNITY): Payer: Self-pay

## 2022-09-15 VITALS — BP 160/110 | HR 104 | Wt 197.2 lb

## 2022-09-15 DIAGNOSIS — E1143 Type 2 diabetes mellitus with diabetic autonomic (poly)neuropathy: Secondary | ICD-10-CM | POA: Insufficient documentation

## 2022-09-15 DIAGNOSIS — G4733 Obstructive sleep apnea (adult) (pediatric): Secondary | ICD-10-CM | POA: Diagnosis not present

## 2022-09-15 DIAGNOSIS — Z833 Family history of diabetes mellitus: Secondary | ICD-10-CM | POA: Diagnosis not present

## 2022-09-15 DIAGNOSIS — I2510 Atherosclerotic heart disease of native coronary artery without angina pectoris: Secondary | ICD-10-CM | POA: Diagnosis not present

## 2022-09-15 DIAGNOSIS — I130 Hypertensive heart and chronic kidney disease with heart failure and stage 1 through stage 4 chronic kidney disease, or unspecified chronic kidney disease: Secondary | ICD-10-CM | POA: Diagnosis not present

## 2022-09-15 DIAGNOSIS — N184 Chronic kidney disease, stage 4 (severe): Secondary | ICD-10-CM | POA: Insufficient documentation

## 2022-09-15 DIAGNOSIS — I5022 Chronic systolic (congestive) heart failure: Secondary | ICD-10-CM | POA: Diagnosis present

## 2022-09-15 DIAGNOSIS — Z8673 Personal history of transient ischemic attack (TIA), and cerebral infarction without residual deficits: Secondary | ICD-10-CM | POA: Diagnosis not present

## 2022-09-15 DIAGNOSIS — Z79899 Other long term (current) drug therapy: Secondary | ICD-10-CM | POA: Insufficient documentation

## 2022-09-15 DIAGNOSIS — I10 Essential (primary) hypertension: Secondary | ICD-10-CM | POA: Diagnosis not present

## 2022-09-15 DIAGNOSIS — E1122 Type 2 diabetes mellitus with diabetic chronic kidney disease: Secondary | ICD-10-CM | POA: Diagnosis not present

## 2022-09-15 DIAGNOSIS — K3184 Gastroparesis: Secondary | ICD-10-CM | POA: Diagnosis not present

## 2022-09-15 DIAGNOSIS — I252 Old myocardial infarction: Secondary | ICD-10-CM | POA: Insufficient documentation

## 2022-09-15 DIAGNOSIS — R5383 Other fatigue: Secondary | ICD-10-CM | POA: Diagnosis not present

## 2022-09-15 DIAGNOSIS — I081 Rheumatic disorders of both mitral and tricuspid valves: Secondary | ICD-10-CM | POA: Diagnosis not present

## 2022-09-15 DIAGNOSIS — R0683 Snoring: Secondary | ICD-10-CM | POA: Insufficient documentation

## 2022-09-15 LAB — BASIC METABOLIC PANEL
Anion gap: 6 (ref 5–15)
BUN: 48 mg/dL — ABNORMAL HIGH (ref 6–20)
CO2: 22 mmol/L (ref 22–32)
Calcium: 8.9 mg/dL (ref 8.9–10.3)
Chloride: 109 mmol/L (ref 98–111)
Creatinine, Ser: 2.8 mg/dL — ABNORMAL HIGH (ref 0.44–1.00)
GFR, Estimated: 20 mL/min — ABNORMAL LOW (ref >60)
Glucose, Bld: 159 mg/dL — ABNORMAL HIGH (ref 70–99)
Potassium: 4.2 mmol/L (ref 3.5–5.1)
Sodium: 137 mmol/L (ref 135–145)

## 2022-09-15 NOTE — Patient Instructions (Signed)
Medication Changes:  We recommend that you continue on your current medications as directed. Please refer to the Current Medication list given to you today.   *If you need a refill on your cardiac medications before your next appointment, please call your pharmacy*  Lab Work:  Labs done today, your results will be available in MyChart, we will contact you for abnormal readings.   Follow-Up in:   Schedule an appointment as needed!    Do the following things EVERYDAY: Weigh yourself in the morning before breakfast. Write it down and keep it in a log. Take your medicines as prescribed Eat low salt foods--Limit salt (sodium) to 2000 mg per day.  Stay as active as you can everyday Limit all fluids for the day to less than 2 liters    Need to Contact us:  If you have any questions or concerns before your next appointment please send [place] a message through [place] or call our office at [phone].    TO LEAVE A MESSAGE FOR THE NURSE SELECT OPTION 2, PLEASE LEAVE A MESSAGE INCLUDING: YOUR NAME DATE OF BIRTH CALL BACK NUMBER REASON FOR CALL**this is important as we prioritize the call backs  YOU WILL RECEIVE A CALL BACK THE SAME DAY AS LONG AS YOU CALL BEFORE 4:00 PM   At the [facility], you and your health needs are our priority. As part of our continuing mission to provide you with exceptional heart care, we have created designated Provider Care Teams. These Care Teams include your primary Cardiologist (physician) and Advanced Practice Providers (APPs- Physician Assistants and Nurse Practitioners) who all work together to provide you with the care you need, when you need it.   You may see any of the following providers on your designated Care Team at your next follow up: Dr Arvilla Meres Dr Marca Ancona Dr. Marcos Eke, NP Robbie Lis, [place] [facility] [place], [place] Brynda Peon, NP Karle Plumber, PharmD   Please be sure to  bring in all your medications bottles to every appointment.    Thank you for choosing [facility]

## 2022-09-28 ENCOUNTER — Encounter (HOSPITAL_BASED_OUTPATIENT_CLINIC_OR_DEPARTMENT_OTHER): Payer: Self-pay | Admitting: Emergency Medicine

## 2022-09-28 ENCOUNTER — Other Ambulatory Visit: Payer: Self-pay

## 2022-09-28 ENCOUNTER — Emergency Department (HOSPITAL_BASED_OUTPATIENT_CLINIC_OR_DEPARTMENT_OTHER)
Admission: EM | Admit: 2022-09-28 | Discharge: 2022-09-28 | Disposition: A | Discharge status: Home / Self Care | Payer: Medicare HMO | Attending: Emergency Medicine | Admitting: Emergency Medicine

## 2022-09-28 DIAGNOSIS — N611 Abscess of the breast and nipple: Secondary | ICD-10-CM | POA: Insufficient documentation

## 2022-09-28 DIAGNOSIS — Z87891 Personal history of nicotine dependence: Secondary | ICD-10-CM | POA: Diagnosis not present

## 2022-09-28 DIAGNOSIS — I10 Essential (primary) hypertension: Secondary | ICD-10-CM | POA: Insufficient documentation

## 2022-09-28 DIAGNOSIS — E119 Type 2 diabetes mellitus without complications: Secondary | ICD-10-CM | POA: Insufficient documentation

## 2022-09-28 DIAGNOSIS — I2510 Atherosclerotic heart disease of native coronary artery without angina pectoris: Secondary | ICD-10-CM | POA: Diagnosis not present

## 2022-09-28 DIAGNOSIS — J45909 Unspecified asthma, uncomplicated: Secondary | ICD-10-CM | POA: Diagnosis not present

## 2022-09-28 MED ORDER — LIDOCAINE HCL (PF) 1 % IJ SOLN
30.0000 mL | Freq: Once | INTRAMUSCULAR | Status: AC
  Administered 2022-09-28: 30 mL
  Filled 2022-09-28: qty 30

## 2022-09-28 MED ORDER — SULFAMETHOXAZOLE-TRIMETHOPRIM 800-160 MG PO TABS: 1.0000 | ORAL_TABLET | Freq: Two times a day (BID) | ORAL | 0 refills | Status: AC

## 2022-09-28 NOTE — ED Provider Notes (Signed)
[facility] Emergency Department Provider Note MRN:  [id number]  Arrival date & time: 09/28/22     Chief Complaint   Abscess   History of Present Illness   Shirley [name] is a 47 y.o. year-old female with a history of CAD presenting to the ED with chief complaint of abscess.  Abscess beneath the left breast, present for 2 days, tender.  No fever.  Review of Systems  A thorough review of systems was obtained and all systems are negative except as noted in the HPI and PMH.   Patient's Health History    Past Medical History:  Diagnosis Date   Abscess of tunica vaginalis    10/09- Abundant S. aureus- sensitive to all abx   Anxiety    Blood dyscrasia    CAD (coronary artery disease) 06/15/2006   s/p Subendocardial MI with PDA angioplasty(no stent) on 06/15/06 and relook  cath 06/19/06 showed patency of site. Cath 12/10- no restenosis or significant CAD progression   CVA (cerebral vascular accident) (HCC) ~ 02/2014   denies residual on 04/22/2014   CVA (cerebral vascular accident) [facility])    history of remote right cerebellar infarct noted on head CT at least since 10/2011   Depression    Diabetes mellitus type 2, uncontrolled, with complications    Fibromyalgia    Gastritis    Gastroparesis    secondary to poorly controlled DM, last emptying study performed 01/2010  was normal but may be falsely positive as pt was on reglan   GERD (gastroesophageal reflux disease)    Hepatitis B, chronic (HCC)    Hep BeAb+,Hep B cAb+ & Hep BsAg+ (9/06)   History of pyelonephritis    H/o GrpB Pyelonephritis (9/06) and UTI- 07/11- E.Coli, 12/10- GBS   Hyperlipidemia    Hypertension    Iron deficiency anemia    Irregular menses    Small ovarian follicles seen on CT(9/06)   MI (myocardial infarction) (HCC) 05/2006   PDA percutaneous transluminal coronary angioplasty   Migraine    "weekly" (04/22/2014)   N&V (nausea and vomiting)    Chronic. Unclear etiology with  multiple admission and ED visits. CT abdomen with and without contrast (02/2011)  showed no acute process. Gastic Emptying scan (01/2010) was normal. Ultrasound of the abdomen was within normal limits. Hepatitis B viral load was undectable. HIV NR. EGD - gastritis, Hpylori + s/p Rx   New onset of congestive heart failure (HCC) 05/16/2022   Obesity    OSA (obstructive sleep apnea)    "suppose to wear mask but I don't" (04/22/2014)   Peripheral neuropathy    Pneumonia    "this is probably the 2nd or 3rd time I've had pneumonia" (04/22/2014)   Recurrent boils    Seasonal asthma    Substance abuse (HCC)    Thrombocytosis    Hem/Onc suggested 2/2 chronic hepatits and/or iron deficiency anemia    Past Surgical History:  Procedure Laterality Date   CESAREAN SECTION  1997   CORONARY ANGIOPLASTY WITH STENT PLACEMENT  2008   "2 stents"   ESOPHAGOGASTRODUODENOSCOPY N/A 04/23/2014   Procedure: ESOPHAGOGASTRODUODENOSCOPY (EGD);  Surgeon: Vertell Novak., MD;  Location: [facility] ENDOSCOPY;  Service: Endoscopy;  Laterality: N/A;   INCISION AND DRAINAGE ABSCESS Right 04/17/2021   Procedure: INCISION AND DRAINAGE CHEST WALL ABSCESS;  Surgeon: Diamantina Monks, MD;  Location: [facility] OR;  Service: General;  Laterality: Right;   IR ANGIOGRAM PELVIS SELECTIVE OR SUPRASELECTIVE  12/07/2016   IR ANGIOGRAM  PELVIS SELECTIVE OR SUPRASELECTIVE  12/07/2016   IR ANGIOGRAM SELECTIVE EACH ADDITIONAL VESSEL  12/07/2016   IR ANGIOGRAM SELECTIVE EACH ADDITIONAL VESSEL  12/07/2016   IR EMBO ARTERIAL NOT HEMORR HEMANG INC GUIDE ROADMAPPING  12/07/2016   IR RADIOLOGIST EVAL & MGMT  01/09/2017   IR US GUIDE VASC ACCESS RIGHT  12/07/2016   IRRIGATION AND DEBRIDEMENT FOOT Right 04/07/2020   Procedure: Incision and Drainage Right Foot;  Surgeon: Park Liter, DPM;  Location: [facility] ORS;  Service: Podiatry;  Laterality: Right;   RIGHT HEART CATH N/A 08/14/2022   Procedure: RIGHT HEART CATH;  Surgeon: Runell Gess, MD;  Location: [facility]  INVASIVE CV LAB;  Service: Cardiovascular;  Laterality: N/A;    Family History  Problem Relation Age of Onset   Diabetes Father    Healthy Mother     Social History   Socioeconomic History   Marital status: Single    Spouse name: Not on file   Number of children: 2   Years of education: 11   Highest education level: Not on file  Occupational History   Occupation: other    Comment: unemployed    Comment: disability  Tobacco Use   Smoking status: Former    Types: Cigarettes    Quit date: 04/24/1996    Years since quitting: 26.4   Smokeless tobacco: Never   Tobacco comments:    quit smoking cigarettes age 19  Vaping Use   Vaping Use: Never used  Substance and Sexual Activity   Alcohol use: No    Alcohol/week: 0.0 standard drinks of alcohol    Comment: 04/22/2014 "might have a few drinks a month"   Drug use: Not Currently    Types: Marijuana, Cocaine    Comment: 04/22/2104 "quit drugs ~ 1-2 yr ago"   Sexual activity: Yes    Birth control/protection: None  Other Topics Concern   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         Social Determinants of Health   Financial Resource Strain: Low Risk  (08/09/2022)   Overall Financial Resource Strain (CARDIA)    Difficulty of Paying Living Expenses: Not hard at all  Food Insecurity: No Food Insecurity (08/08/2022)   Hunger Vital Sign    Worried About Running Out of Food in the Last Year: Never true    Ran Out of Food in the Last Year: Never true  Transportation Needs: No Transportation Needs (08/08/2022)   PRAPARE - Administrator, Civil Service (Medical): No    Lack of Transportation (Non-Medical): No  Physical Activity: Not on file  Stress: Not on file  Social Connections: Not on file  Intimate Partner Violence: Not At Risk (08/08/2022)   Humiliation, Afraid,  Rape, and Kick questionnaire    Fear of Current or Ex-Partner: No    Emotionally Abused: No    Physically Abused: No    Sexually Abused: No     Physical Exam   Vitals:   09/28/22 0340 09/28/22 0342  BP: (!) 180/92 (!) 180/92  Pulse: 97 96  Resp: 18 15  Temp: 98.1 F (36.7 C) 98.1 F (36.7 C)  SpO2: 95% 97%    CONSTITUTIONAL: Well-appearing, NAD NEURO/PSYCH:  Alert and oriented x 3, no focal deficits EYES:  eyes equal and reactive ENT/NECK:  no LAD,  no JVD CARDIO: Regular rate, well-perfused, normal S1 and S2 PULM:  CTAB no wheezing or rhonchi GI/GU:  non-distended, non-tender MSK/SPINE:  No gross deformities, no edema SKIN:  no rash, atraumatic   *Additional and/or pertinent findings included in MDM below  Diagnostic and Interventional Summary    EKG Interpretation  Date/Time:    Ventricular Rate:    PR Interval:    QRS Duration:   QT Interval:    QTC Calculation:   R Axis:     Text Interpretation:         Labs Reviewed - No data to display  No orders to display    Medications  lidocaine (PF) (XYLOCAINE) 1 % injection 30 mL (has no administration in time range)     Procedures  /  Critical Care .Marland KitchenIncision and Drainage  Date/Time: 09/28/2022 5:36 AM  Performed by: Sabas Sous, MD Authorized by: Sabas Sous, MD   Consent:    Consent obtained:  Verbal   Consent given by:  Patient   Risks, benefits, and alternatives were discussed: yes     Risks discussed:  Bleeding, damage to other organs, infection, incomplete drainage and pain   Alternatives discussed:  No treatment Universal protocol:    Procedure explained and questions answered to patient or proxy's satisfaction: yes     Immediately prior to procedure, a time out was called: yes     Patient identity confirmed:  Verbally with patient Location:    Type:  Abscess   Size:  3cm Pre-procedure details:    Skin preparation:  Povidone-iodine Sedation:    Sedation type:  None Anesthesia:     Anesthesia method:  Local infiltration   Local anesthetic:  Lidocaine 1% w/o epi Procedure type:    Complexity:  Complex Procedure details:    Ultrasound guidance: no     Needle aspiration: no     Incision types:  Cruciate   Incision depth:  Subcutaneous   Wound management:  Probed and deloculated, irrigated with saline, extensive cleaning and debrided   Drainage:  Bloody and purulent   Drainage amount:  Moderate   Wound treatment:  Wound left open   Packing materials:  None Post-procedure details:    Procedure completion:  Tolerated well, no immediate complications   ED Course and Medical Decision Making  Initial Impression and Ddx Small fluctuant abscess beneath the left breast, seems superficial, appropriate for I&D here in the emergency department.  Past medical/surgical history that increases complexity of ED encounter: History of breast abscess on the left requiring OR I&D  Interpretation of Diagnostics Laboratory and/or imaging options to aid in the diagnosis/care of the patient were considered.  After careful history and physical examination, it was determined that there was no indication for diagnostics at this time.  Patient Reassessment and Ultimate Disposition/Management      I&D performed as described above, tolerated well, appropriate for discharge.  Patient management required discussion with the following services or consulting groups:  None  Complexity of Problems Addressed Acute complicated illness or Injury  Additional Data Reviewed and Analyzed Further history obtained from: Further history from spouse/family member  Additional Factors Impacting ED Encounter Risk Prescriptions and Minor Procedures  Elmer Sow. Pilar Plate, MD [facility] Emergency Medicine [facility] [email or web address]  Final Clinical Impressions(s) / ED Diagnoses     ICD-10-CM   1. Breast abscess  N61.1       ED Discharge Orders  Ordered     sulfamethoxazole-trimethoprim (BACTRIM DS) 800-160 MG tablet  2 times daily        09/28/22 0535             Discharge Instructions Discussed with and Provided to Patient:     Discharge Instructions      You were evaluated in the Emergency Department and after careful evaluation, we did not find any emergent condition requiring admission or further testing in the hospital.  Your exam/testing today is overall reassuring.  We drained your abscess here in the emergency department.  Take the Bactrim antibiotic as directed to help clear any remaining infection.  Tylenol or Motrin for pain.  Please return to the Emergency Department if you experience any worsening of your condition.   Thank you for allowing [place] to be a part of your care.       Sabas Sous, MD 09/28/22 [phone]

## 2022-09-28 NOTE — ED Triage Notes (Signed)
Pt c/o left breast abscess x 2 days  

## 2022-09-28 NOTE — ED Notes (Signed)
Underside of left breast dressed with abx ointment, covered with a 4x4 folded, and secured with a tegaderm and tape.

## 2022-09-28 NOTE — Discharge Instructions (Signed)
You were evaluated in the Emergency Department and after careful evaluation, we did not find any emergent condition requiring admission or further testing in the hospital.  Your exam/testing today is overall reassuring.  We drained your abscess here in the emergency department.  Take the Bactrim antibiotic as directed to help clear any remaining infection.  Tylenol or Motrin for pain.  Please return to the Emergency Department if you experience any worsening of your condition.   Thank you for allowing [place] to be a part of your care.

## 2022-10-04 LAB — LAB REPORT - SCANNED
A1c: 7.6
Creatinine, POC: 59.3 mg/dL
EGFR: 15

## 2022-10-31 IMAGING — DX DG FOOT COMPLETE 3+V*R*
3 series · 3 of 3 positions shown · non-contrast
Comparison: None.

CLINICAL DATA: Localized right foot pain and swelling with lump on
the plantar surface

EXAM:
RIGHT FOOT COMPLETE - 3+ VIEW

[foot ap]
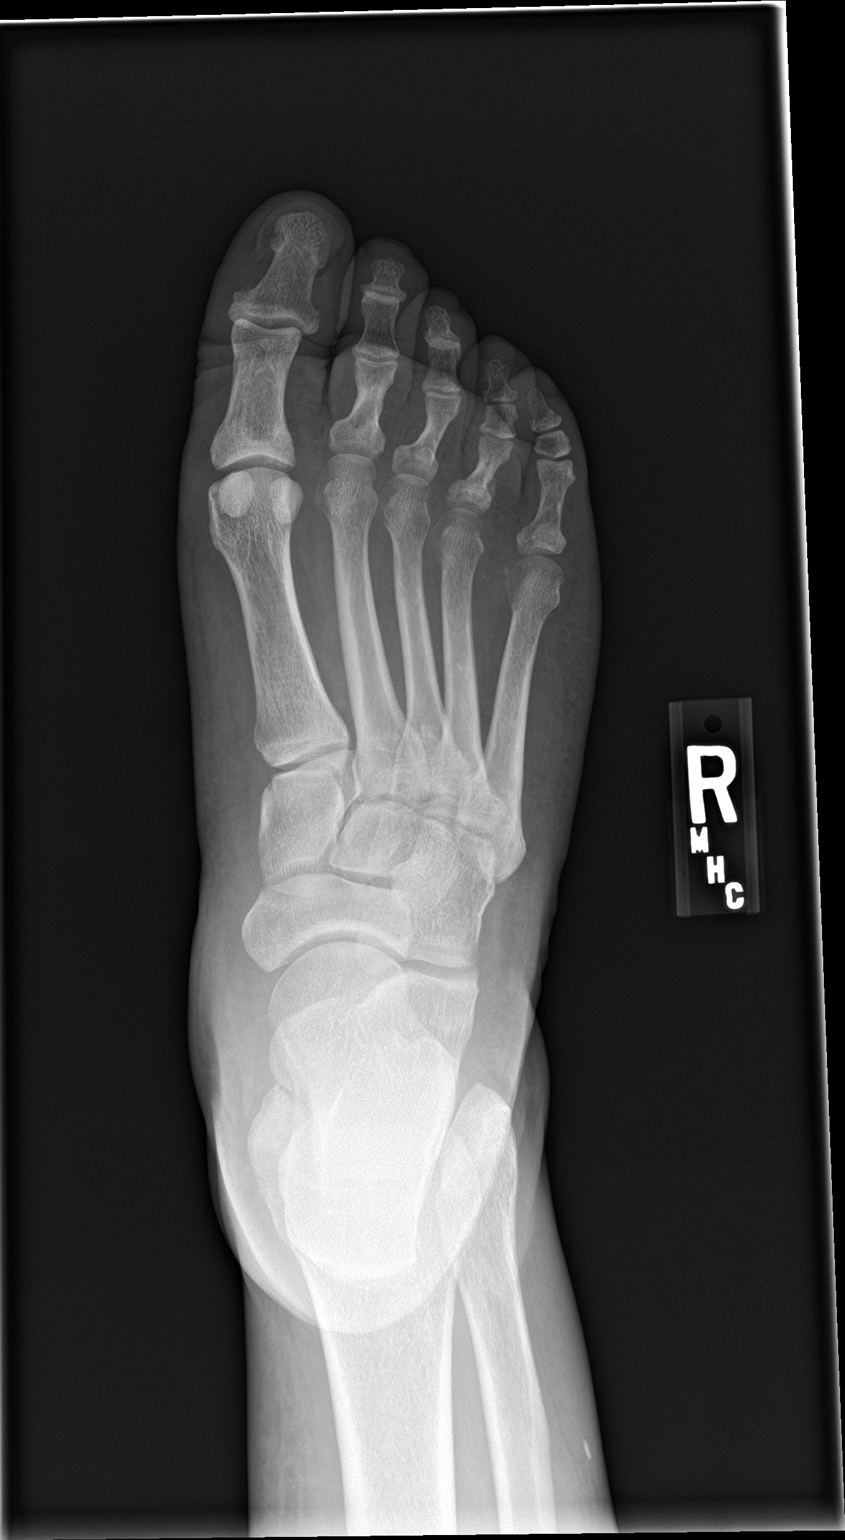

[foot obl]
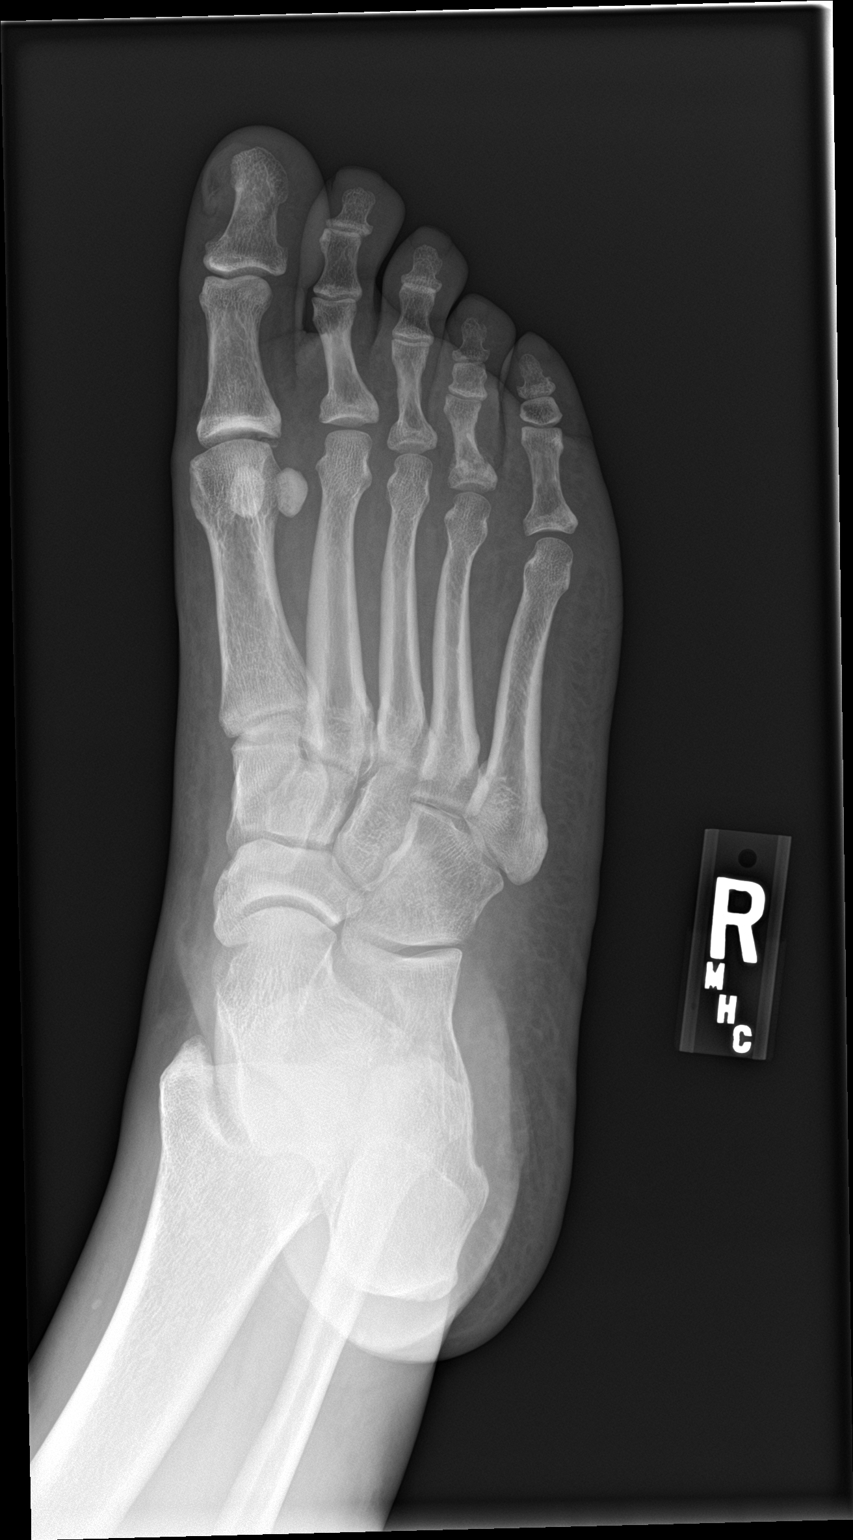

[foot lat]
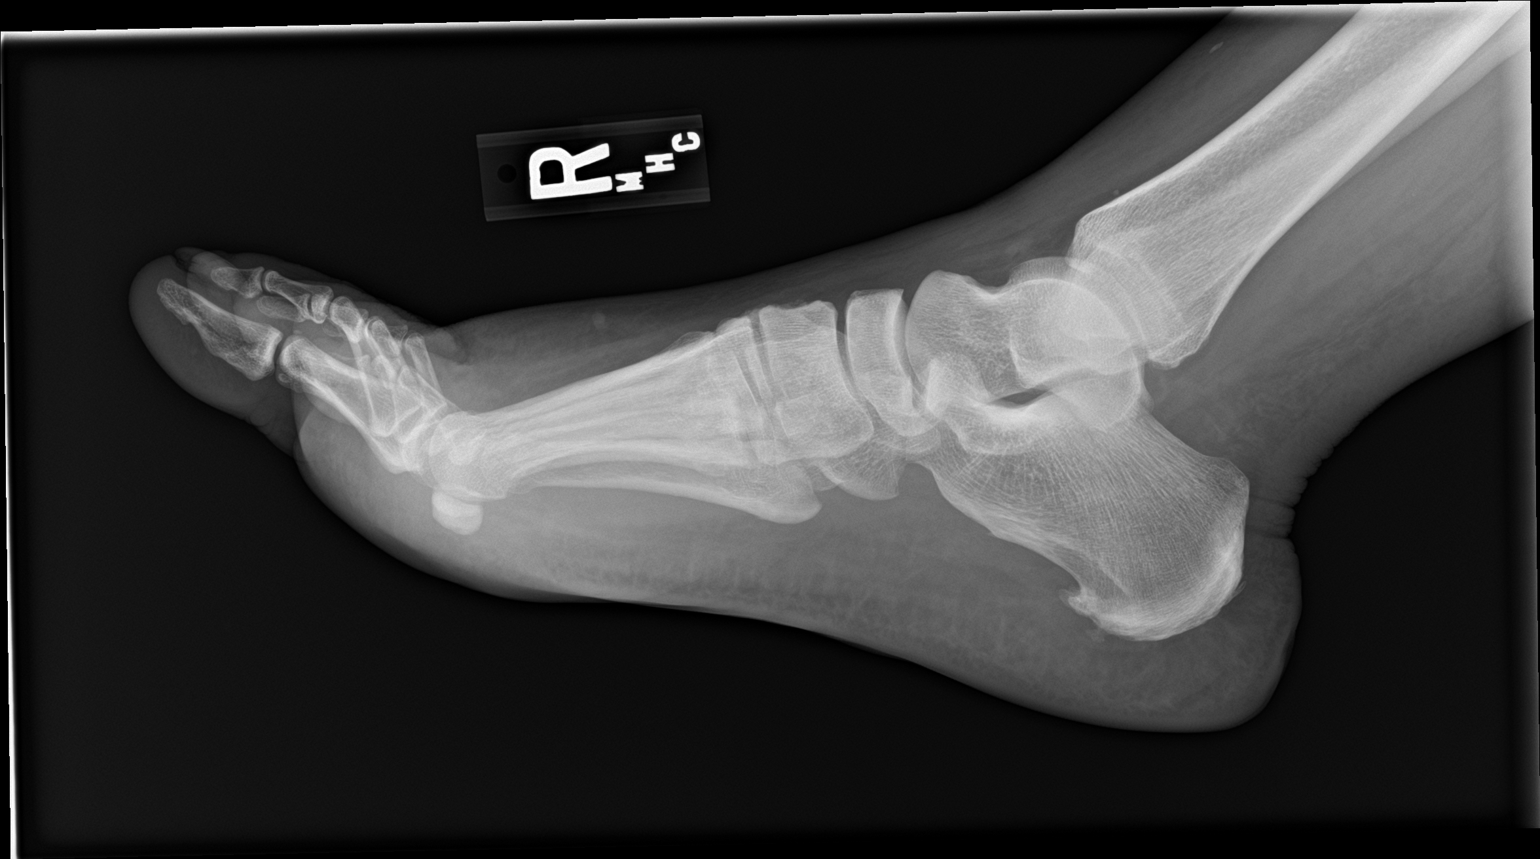

[3 of 3 positions shown; findings below may reference images not displayed]

FINDINGS: Focal soft tissue swelling is noted towards the level of the
forefoot. This on a background of more diffuse mild edematous
changes. Benign soft tissue mineralization seen anterior to the
tibia. No clear soft tissue gas or foreign body is seen.
Irregularity of the first nail plate, indeterminate, correlate with
exam.

There is some persistent lucency and callus formation about the base
of the fourth proximal phalanx which suggests incomplete healing of
a previously seen fracture. No acute fracture or traumatic osseous
injury is identified. Tiny ossicle at the first metatarsophalangeal
joint separate from the hallucal sesamoids.

Plantar calcaneal spurring is noted.
IMPRESSION: 1. Focal soft tissue swelling towards the level of the forefoot. No
clear soft tissue gas or foreign body.
2. Irregularity of the first nail plate, indeterminate, correlate
with exam.
3. Some persistent lucency and callus formation about the base of
the fourth proximal phalanx which suggests incomplete healing of a
previously seen fracture.
4. No new acute osseous abnormality.

## 2022-11-08 LAB — LAB REPORT - SCANNED
A1c: 8.3
Creatinine, POC: 63.1 mg/dL
eGFR: 24

## 2022-11-09 ENCOUNTER — Institutional Professional Consult (permissible substitution) (HOSPITAL_BASED_OUTPATIENT_CLINIC_OR_DEPARTMENT_OTHER): Payer: Medicare HMO | Admitting: Cardiovascular Disease

## 2022-11-10 IMAGING — MR MR FOOT*R* W/O CM
5 series · 40 of 40 positions shown · non-contrast
Comparison: X-ray 04/06/2020

CLINICAL DATA: Right foot infection for 2 weeks

EXAM:
MRI OF THE RIGHT FOREFOOT WITHOUT CONTRAST
TECHNIQUE: Multiplanar, multisequence MR imaging of the right forefoot was
performed. No intravenous contrast was administered.

[Series 3: T2 fat-sat · axial · right · 3.0mm · 0.59mm/px · z∈[-76,-3]mm · 5 of 22 slices shown (1 of 2)]
[im 1/22]
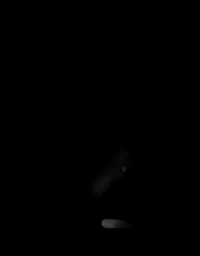
[im 6/22]
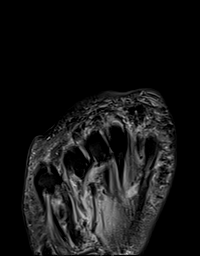
[im 11/22]
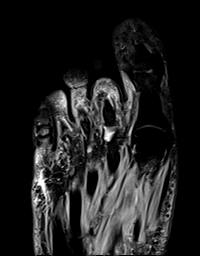
[im 16/22]
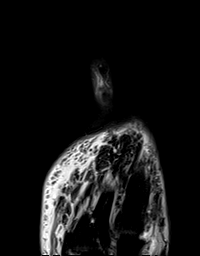
[im 22/22]
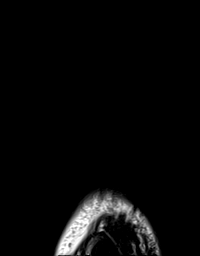

[Series 4: T1 · axial · right · 3.0mm · 0.59mm/px · z∈[-76,-3]mm · 6 of 22 slices shown (1 of 2)]
[im 1/22]
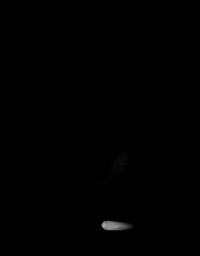
[im 5/22]
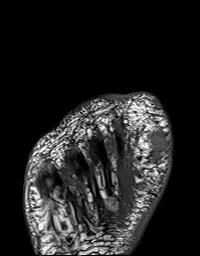
[im 9/22]
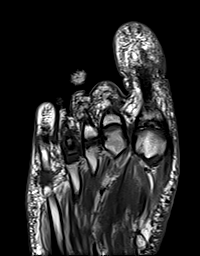
[im 13/22]
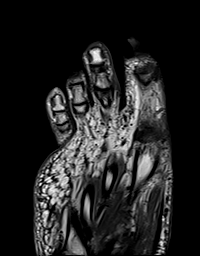
[im 17/22]
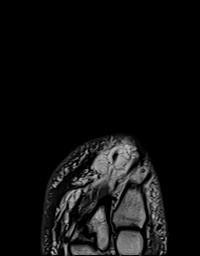
[im 22/22]
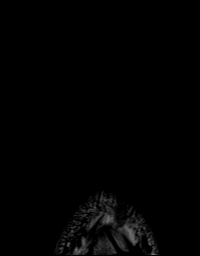

[Series 5: T1 · coronal · right · 3.0mm · 0.47mm/px · 11 of 39 slices shown (2 of 2)]
[im 1/39]
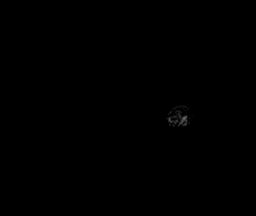
[im 4/39]
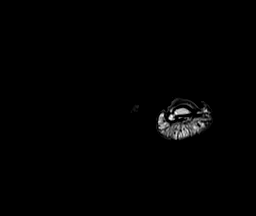
[im 8/39]
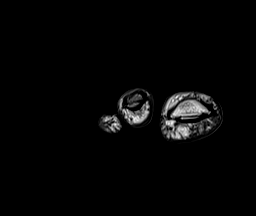
[im 12/39]
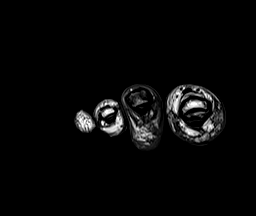
[im 16/39]
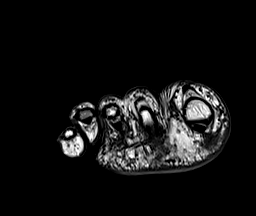
[im 20/39]
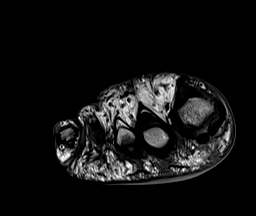
[im 23/39]
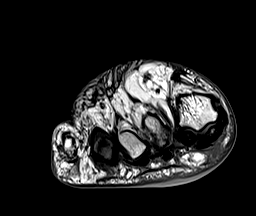
[im 27/39]
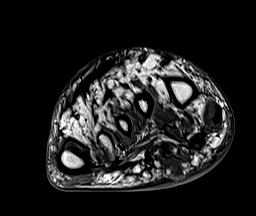
[im 31/39]
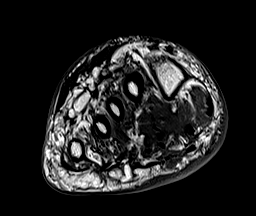
[im 35/39]
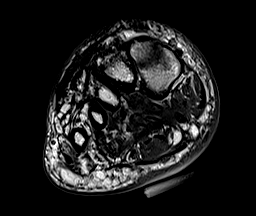
[im 39/39]
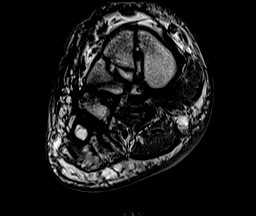

[Series 6: T2 fat-sat · coronal · right · 3.0mm · 0.38mm/px · 11 of 39 slices shown (2 of 2)]
[im 1/39]
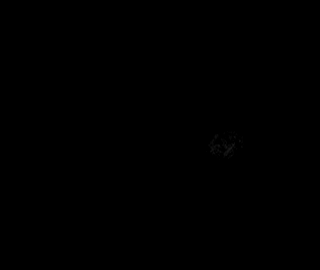
[im 4/39]
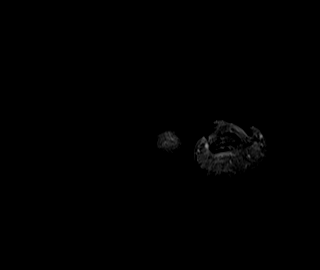
[im 8/39]
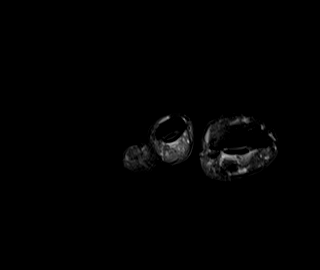
[im 12/39]
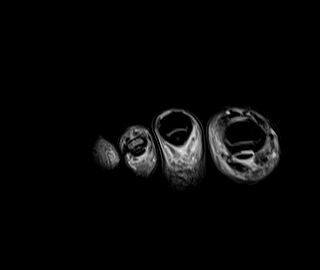
[im 16/39]
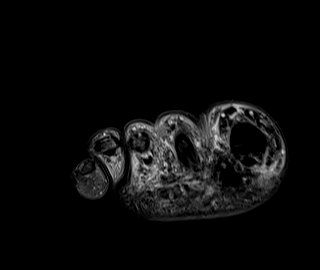
[im 20/39]
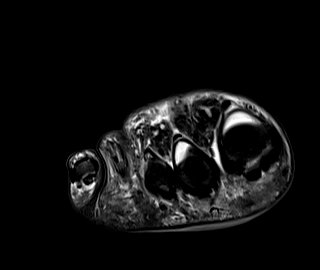
[im 23/39]
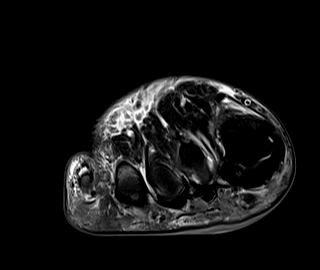
[im 27/39]
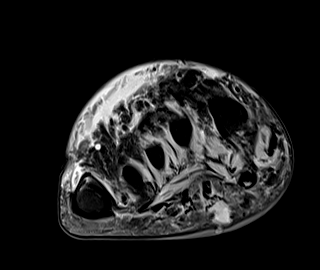
[im 31/39]
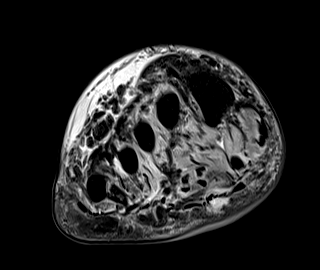
[im 35/39]
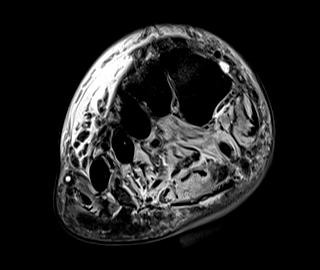
[im 39/39]
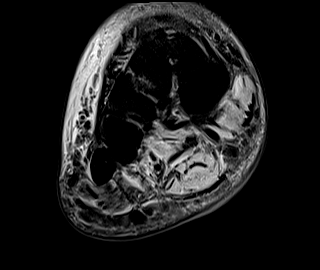

[Series 7: STIR · sagittal · right · 3.0mm · 0.29mm/px · 7 of 25 slices shown]
[im 1/25]
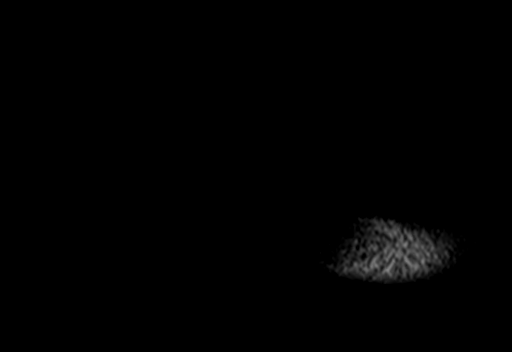
[im 5/25]
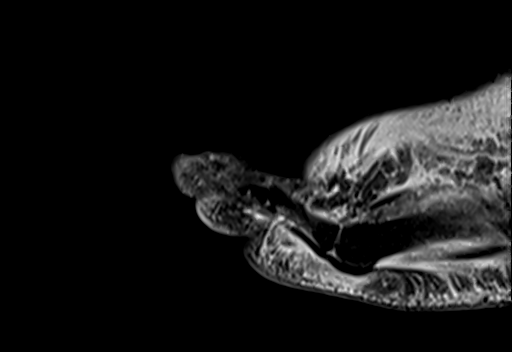
[im 9/25]
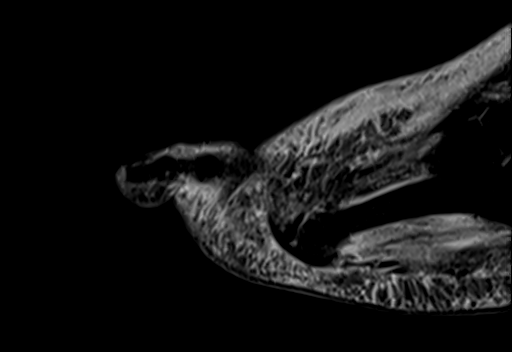
[im 13/25]
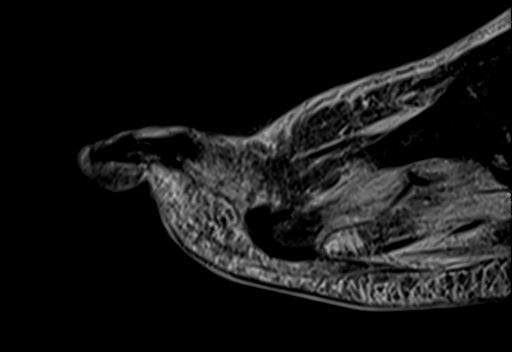
[im 17/25]
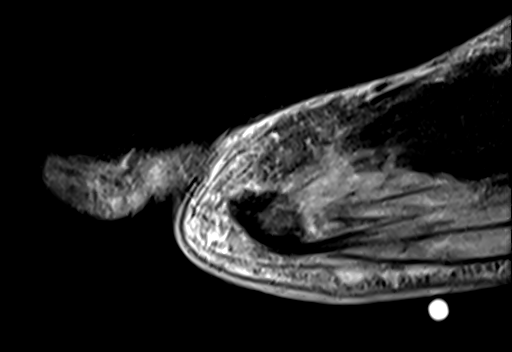
[im 21/25]
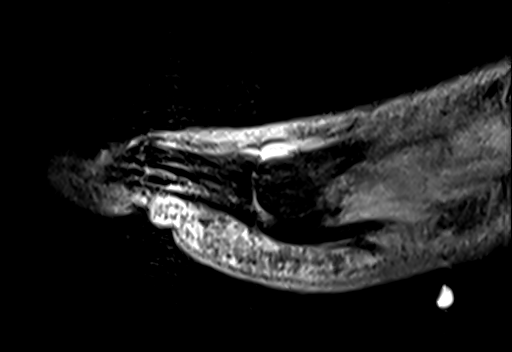
[im 25/25]
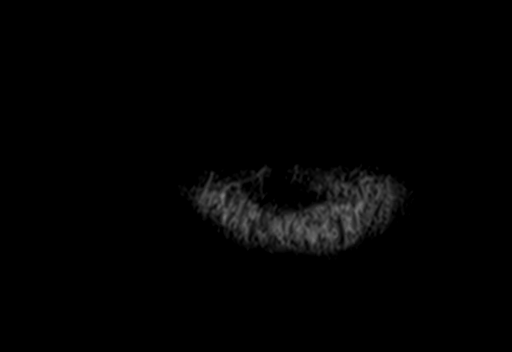

[40 of 40 positions shown; findings below may reference images not displayed]

FINDINGS: Technical note: Motion degraded exam.

Bones/Joint/Cartilage

Subacute to chronic nondisplaced fracture involving the base of the
fourth toe proximal phalanx with extensive marrow edema within the
fourth toe base and diaphysis (series 7, image 8). Osseous
structures appear otherwise intact without additional fracture. No
dislocation. No bony erosion. Mild arthropathy at the hallux
sesamoid complex. Small joint effusions of the first, second, third,
and fourth MTP joints.

Ligaments

Intact Lisfranc ligament. Collateral ligaments forefoot appear
intact.

Muscles and Tendons

Diffuse edema-like signal throughout the intrinsic foot musculature
suggesting a nonspecific myositis. No intramuscular fluid
collection. Flexor and extensor tendons appear intact. No
significant tenosynovial fluid collection.

Soft tissues

Small focal plantar soft tissue ulceration underlying the level of
the second metatarsal diaphysis (series 6, image 27). Fluid
collection within the underlying subcutaneous soft tissues measuring
3.6 x 0.7 x 1.0 cm (series 7, image 18). There is prominent soft
tissue swelling over the dorsum of the forefoot. Small volume fluid
within the first, second, and third intermetatarsal spaces.
IMPRESSION: 1. Small focal plantar soft tissue ulceration underlying the level
of the second metatarsal diaphysis. Fluid collection within the
underlying subcutaneous soft tissues measuring 3.6 x 0.7 x 1.0 cm
concerning for abscess.
2. Subacute-to-chronic nondisplaced fracture involving the base of
the fourth toe proximal phalanx with extensive marrow edema within
the underlying fourth toe base and diaphysis.
3. No bone marrow signal changes to suggest osteomyelitis.
4. Small joint effusions of the first, second, third, and fourth MTP
joints. Findings are likely reactive although septic arthritis not
entirely excluded.
5. Diffuse edema-like signal throughout the intrinsic foot
musculature suggesting a nonspecific myositis.
6. Small volume fluid within the first through third intermetatarsal
spaces, which may reflect bursitis.

Findings are in agreement with the preliminary report provided by
Dr. Shalley at [DATE] p.m. on 04/06/2020.

## 2022-11-10 IMAGING — DX DG FOOT COMPLETE 3+V*R*
3 series · 3 of 3 positions shown · non-contrast
Comparison: None.

CLINICAL DATA: Soft tissue infection

EXAM:
RIGHT FOOT COMPLETE - 3+ VIEW

[foot ap]
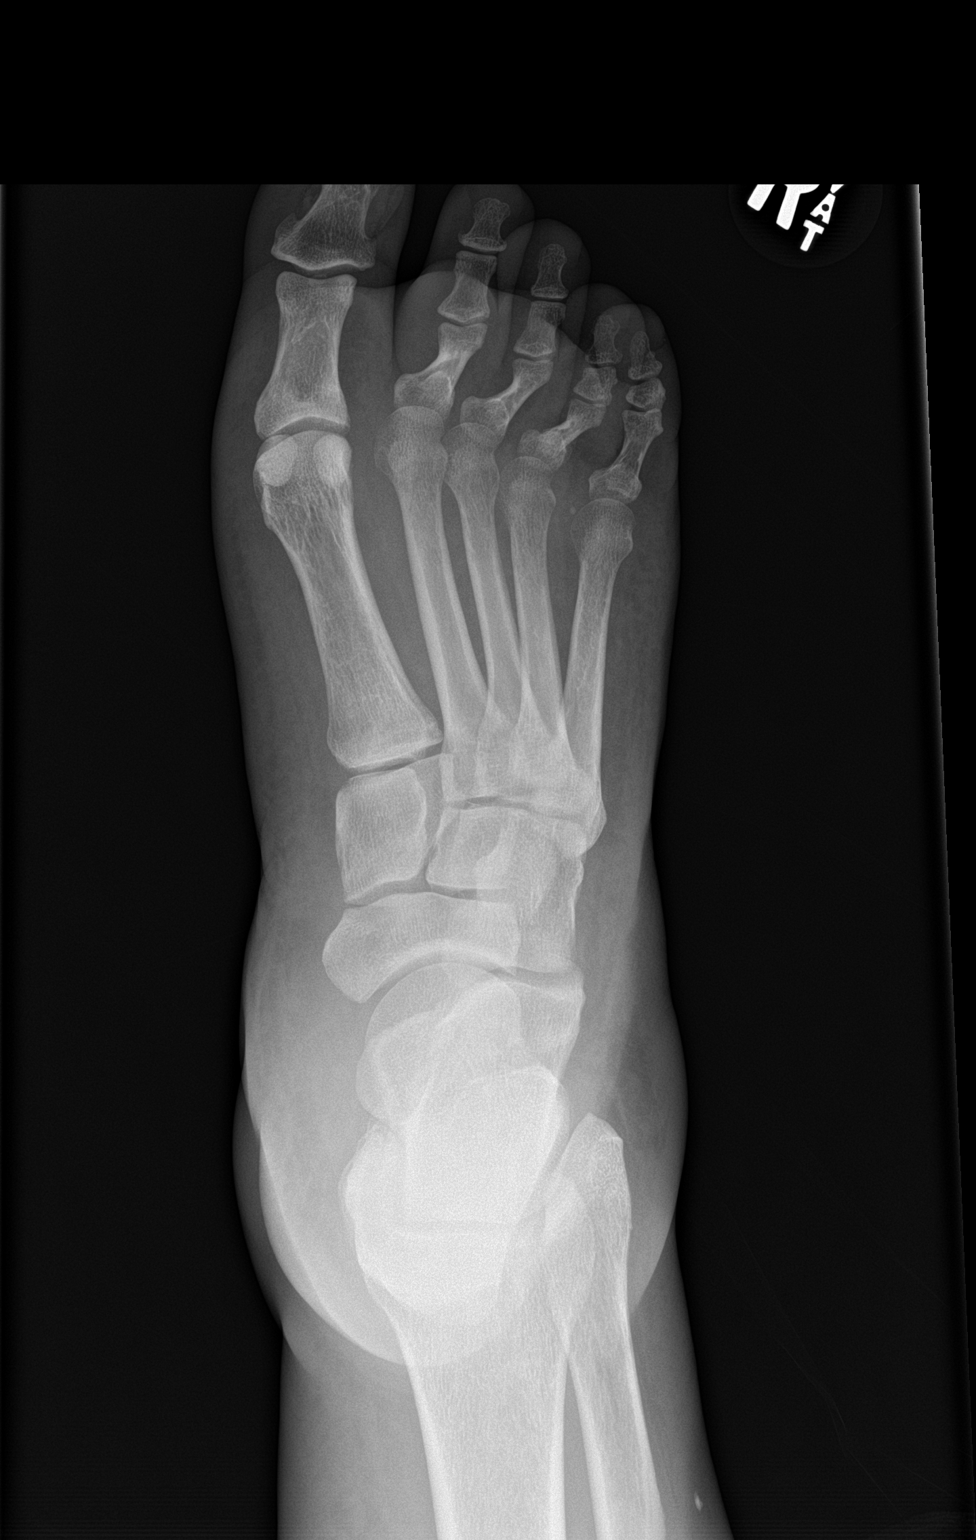

[foot obl]
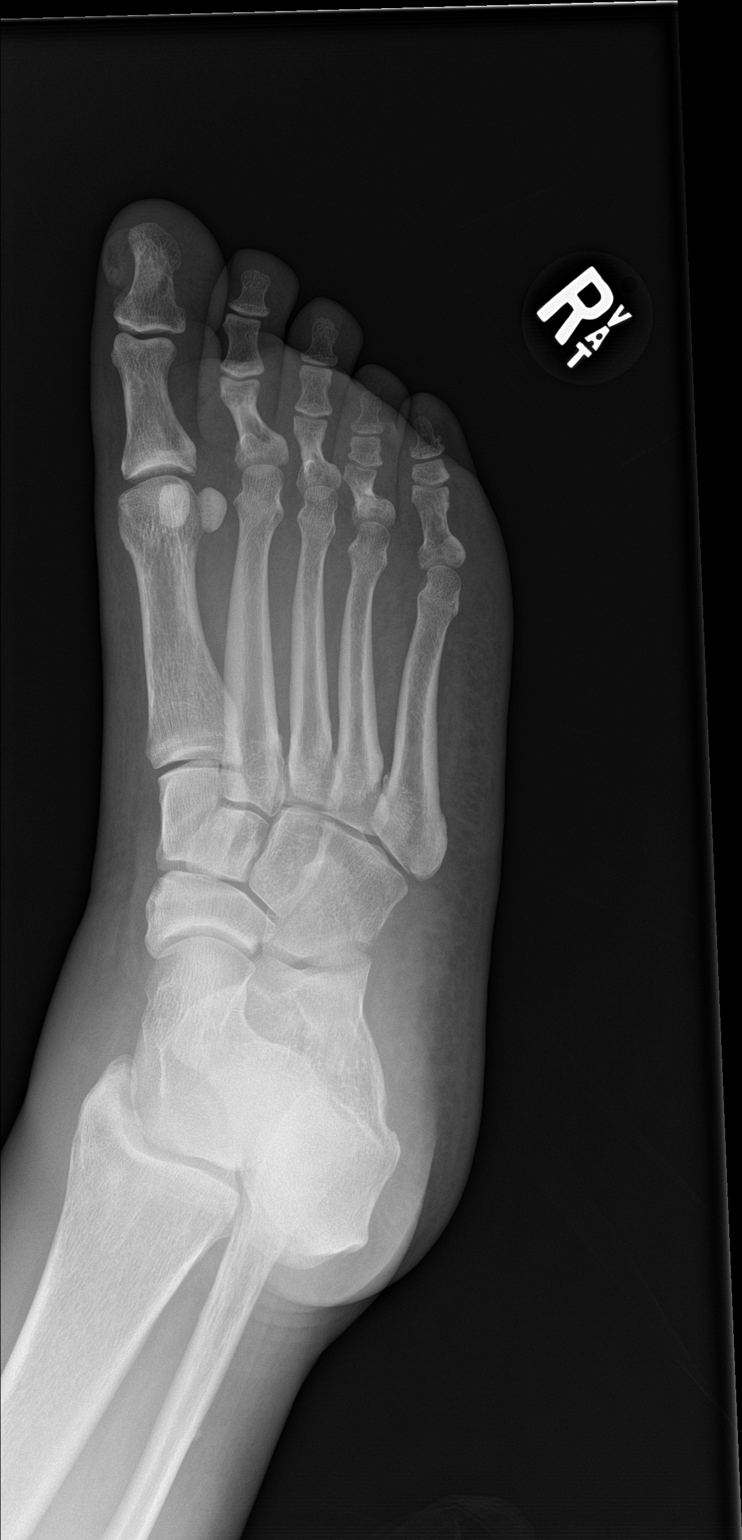

[foot lat]
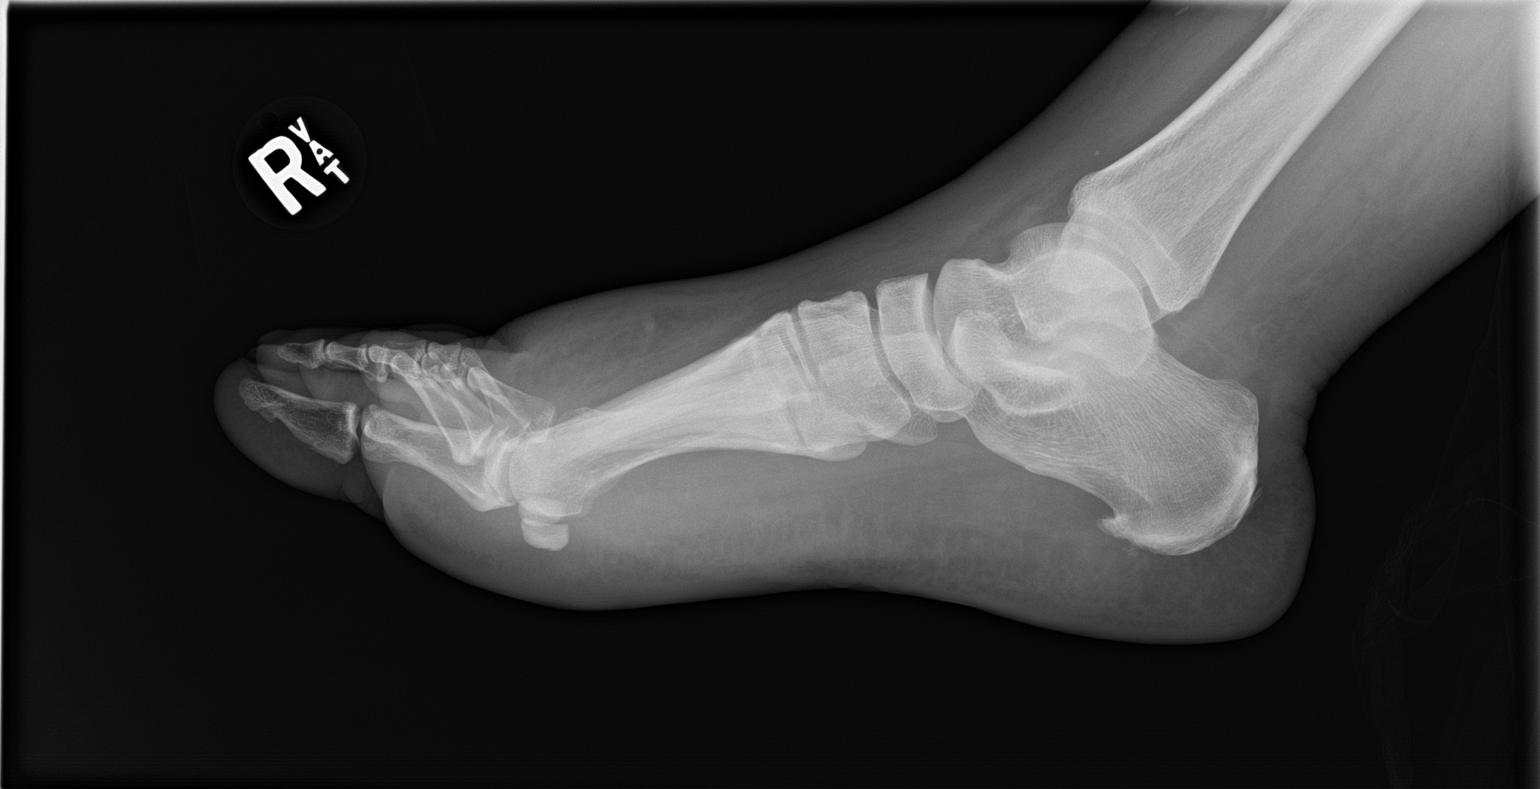

[3 of 3 positions shown; findings below may reference images not displayed]

FINDINGS: Generalized soft tissue swelling is noted about the metatarsals
consistent with the given clinical history. No bony erosive changes
are seen. No fracture or dislocation is noted. Small calcaneal spurs
are noted.
IMPRESSION: Soft tissue swelling consistent with the given clinical history. No
acute bony abnormality is noted.

## 2022-11-21 ENCOUNTER — Other Ambulatory Visit (HOSPITAL_COMMUNITY): Payer: Self-pay | Admitting: Cardiology

## 2022-11-22 NOTE — Telephone Encounter (Signed)
This pt was just seen in the CHF clinic on 09/15/22 by Tonye Becket, NP. Please address

## 2022-12-21 ENCOUNTER — Institutional Professional Consult (permissible substitution) (HOSPITAL_BASED_OUTPATIENT_CLINIC_OR_DEPARTMENT_OTHER): Payer: Medicare HMO | Admitting: Family

## 2022-12-28 ENCOUNTER — Other Ambulatory Visit: Payer: Self-pay

## 2022-12-28 ENCOUNTER — Emergency Department (HOSPITAL_COMMUNITY)
Admission: EM | Admit: 2022-12-28 | Discharge: 2022-12-28 | Disposition: A | Discharge status: Home / Self Care | Payer: 59 | Attending: Student | Admitting: Student

## 2022-12-28 DIAGNOSIS — U071 COVID-19: Secondary | ICD-10-CM | POA: Insufficient documentation

## 2022-12-28 DIAGNOSIS — N611 Abscess of the breast and nipple: Secondary | ICD-10-CM | POA: Diagnosis not present

## 2022-12-28 DIAGNOSIS — I130 Hypertensive heart and chronic kidney disease with heart failure and stage 1 through stage 4 chronic kidney disease, or unspecified chronic kidney disease: Secondary | ICD-10-CM | POA: Diagnosis not present

## 2022-12-28 DIAGNOSIS — I509 Heart failure, unspecified: Secondary | ICD-10-CM | POA: Diagnosis not present

## 2022-12-28 DIAGNOSIS — H9319 Tinnitus, unspecified ear: Secondary | ICD-10-CM | POA: Insufficient documentation

## 2022-12-28 DIAGNOSIS — Z7901 Long term (current) use of anticoagulants: Secondary | ICD-10-CM | POA: Diagnosis not present

## 2022-12-28 DIAGNOSIS — B081 Molluscum contagiosum: Secondary | ICD-10-CM | POA: Diagnosis not present

## 2022-12-28 DIAGNOSIS — N184 Chronic kidney disease, stage 4 (severe): Secondary | ICD-10-CM | POA: Diagnosis not present

## 2022-12-28 DIAGNOSIS — Z87891 Personal history of nicotine dependence: Secondary | ICD-10-CM | POA: Insufficient documentation

## 2022-12-28 DIAGNOSIS — Z79899 Other long term (current) drug therapy: Secondary | ICD-10-CM | POA: Diagnosis not present

## 2022-12-28 DIAGNOSIS — Z794 Long term (current) use of insulin: Secondary | ICD-10-CM | POA: Insufficient documentation

## 2022-12-28 DIAGNOSIS — E1122 Type 2 diabetes mellitus with diabetic chronic kidney disease: Secondary | ICD-10-CM | POA: Insufficient documentation

## 2022-12-28 DIAGNOSIS — M791 Myalgia, unspecified site: Secondary | ICD-10-CM | POA: Diagnosis present

## 2022-12-28 DIAGNOSIS — I2510 Atherosclerotic heart disease of native coronary artery without angina pectoris: Secondary | ICD-10-CM | POA: Insufficient documentation

## 2022-12-28 LAB — GROUP A STREP BY PCR: Group A Strep by PCR: NOT DETECTED

## 2022-12-28 LAB — SARS CORONAVIRUS 2 BY RT PCR: SARS Coronavirus 2 by RT PCR: POSITIVE — AB

## 2022-12-28 MED ORDER — LIDOCAINE-EPINEPHRINE (PF) 2 %-1:200000 IJ SOLN
10.0000 mL | Freq: Once | INTRAMUSCULAR | Status: AC
  Administered 2022-12-28: 10 mL via INTRADERMAL
  Filled 2022-12-28: qty 20

## 2022-12-28 MED ORDER — NAPROXEN 375 MG PO TABS: 375.0000 mg | ORAL_TABLET | Freq: Two times a day (BID) | ORAL | 0 refills | Status: DC

## 2022-12-28 MED ORDER — CEFADROXIL 500 MG PO CAPS
500.0000 mg | ORAL_CAPSULE | Freq: Two times a day (BID) | ORAL | Status: DC
  Administered 2022-12-28: 500 mg via ORAL
  Filled 2022-12-28 (×2): qty 1

## 2022-12-28 MED ORDER — PROMETHAZINE-DM 6.25-15 MG/5ML PO SYRP: 2.5000 mL | ORAL_SOLUTION | Freq: Four times a day (QID) | ORAL | 0 refills | Status: DC | PRN

## 2022-12-28 MED ORDER — PROCHLORPERAZINE EDISYLATE 10 MG/2ML IJ SOLN
10.0000 mg | Freq: Once | INTRAMUSCULAR | Status: AC
  Administered 2022-12-28: 10 mg via INTRAVENOUS
  Filled 2022-12-28: qty 2

## 2022-12-28 MED ORDER — DIPHENHYDRAMINE HCL 50 MG/ML IJ SOLN
25.0000 mg | Freq: Once | INTRAMUSCULAR | Status: AC
  Administered 2022-12-28: 25 mg via INTRAVENOUS
  Filled 2022-12-28: qty 1

## 2022-12-28 MED ORDER — CEFADROXIL 500 MG PO CAPS: 500.0000 mg | ORAL_CAPSULE | Freq: Two times a day (BID) | ORAL | 0 refills | Status: AC

## 2022-12-28 MED ORDER — LACTATED RINGERS IV BOLUS
1000.0000 mL | Freq: Once | INTRAVENOUS | Status: AC
  Administered 2022-12-28: 1000 mL via INTRAVENOUS

## 2022-12-28 NOTE — ED Notes (Signed)
Awaiting patient from lobby 

## 2022-12-28 NOTE — ED Triage Notes (Signed)
Pt. Stated, Ive had congestion with body aches and have had chills without a fever.  I also have these [place] bumps all over me for about a week.

## 2022-12-28 NOTE — ED Provider Notes (Signed)
[place] EMERGENCY DEPARTMENT AT [facility] Provider Note  CSN: [id number] Arrival date & time: 12/28/22 1052  Chief Complaint(s) Nasal Congestion, Generalized Body Aches, Rash, and Tinnitus  HPI Shirley [name] is a 47 y.o. female with PMH multiple breast abscesses, CAD status post MI, previous CVA, T2DM, iron deficiency anemia, substance abuse who presents emergency department for evaluation of multiple complaints including generalized myalgias, headache, nausea and nasal congestion, rash and breast pain.  Patient states that symptoms have been developing over the last 1 week and she does have sick contacts with sick children at home.  She states that she has exposure to a grandchild who had a similar rash and she now has a raised papular rash over her upper extremities.  She denies numbness, tingling, weakness or other neurologic complaints.  Denies chest pain, shortness of breath, documented fever or other systemic symptoms.   Past Medical History Past Medical History:  Diagnosis Date   Abscess of tunica vaginalis    10/09- Abundant S. aureus- sensitive to all abx   Anxiety    Blood dyscrasia    CAD (coronary artery disease) 06/15/2006   s/p Subendocardial MI with PDA angioplasty(no stent) on 06/15/06 and relook  cath 06/19/06 showed patency of site. Cath 12/10- no restenosis or significant CAD progression   CVA (cerebral vascular accident) (HCC) ~ 02/2014   denies residual on 04/22/2014   CVA (cerebral vascular accident) [facility])    history of remote right cerebellar infarct noted on head CT at least since 10/2011   Depression    Diabetes mellitus type 2, uncontrolled, with complications    Fibromyalgia    Gastritis    Gastroparesis    secondary to poorly controlled DM, last emptying study performed 01/2010  was normal but may be falsely positive as pt was on reglan   GERD (gastroesophageal reflux disease)    Hepatitis B, chronic (HCC)    Hep BeAb+,Hep B cAb+ & Hep BsAg+  (9/06)   History of pyelonephritis    H/o GrpB Pyelonephritis (9/06) and UTI- 07/11- E.Coli, 12/10- GBS   Hyperlipidemia    Hypertension    Iron deficiency anemia    Irregular menses    Small ovarian follicles seen on CT(9/06)   MI (myocardial infarction) (HCC) 05/2006   PDA percutaneous transluminal coronary angioplasty   Migraine    "weekly" (04/22/2014)   N&V (nausea and vomiting)    Chronic. Unclear etiology with multiple admission and ED visits. CT abdomen with and without contrast (02/2011)  showed no acute process. Gastic Emptying scan (01/2010) was normal. Ultrasound of the abdomen was within normal limits. Hepatitis B viral load was undectable. HIV NR. EGD - gastritis, Hpylori + s/p Rx   New onset of congestive heart failure (HCC) 05/16/2022   Obesity    OSA (obstructive sleep apnea)    "suppose to wear mask but I don't" (04/22/2014)   Peripheral neuropathy    Pneumonia    "this is probably the 2nd or 3rd time I've had pneumonia" (04/22/2014)   Recurrent boils    Seasonal asthma    Substance abuse (HCC)    Thrombocytosis    Hem/Onc suggested 2/2 chronic hepatits and/or iron deficiency anemia   Patient Active Problem List   Diagnosis Date Noted   CKD (chronic kidney disease) stage 4, GFR 15-29 ml/min (HCC) 08/08/2022   Heart failure with mildly reduced ejection fraction (HFmrEF) (HCC) 05/16/2022   Chest pain 03/21/2022   CAD S/P percutaneous coronary angioplasty 03/21/2022  Acute-on-chronic kidney injury (HCC) 03/21/2022   Acute pulmonary embolism (HCC) 03/21/2022   Sepsis (HCC) 04/17/2021   Conjunctivitis 04/17/2021   Abscess 04/16/2021   Abscess of bursa of right foot    Diabetic foot infection (HCC) 04/06/2020   Cellulitis of foot, right    Puncture wound of right foot    Sepsis due to pneumonia (HCC) 11/24/2019   Thrombocytosis 11/24/2019   Chronic pain 11/24/2019   History of pulmonary embolism 10/10/2017   Chronic anticoagulation 10/10/2017   GERD  (gastroesophageal reflux disease) 10/10/2017   Leukocytosis 10/10/2017   Prolonged QT interval 10/10/2017   Nausea & vomiting 09/20/2017   Hypertensive urgency 09/20/2017   Intractable nausea and vomiting 08/08/2017   Chronic maxillary sinusitis 01/02/2017   Acute blood loss anemia 11/13/2016   Menorrhagia 11/13/2016   Bilateral pulmonary embolism (HCC) 10/30/2016   Acute DVT (deep venous thrombosis) (HCC) 10/30/2016   Nausea and vomiting 09/23/2016   Type 2 diabetes mellitus with hyperglycemia (HCC) 04/07/2016   Diabetic gastroparesis (HCC) 04/06/2016   Dehydration 03/27/2015   Gastroparesis 11/11/2014   Hematemesis 10/13/2014   DKA (diabetic ketoacidoses) 10/13/2014   Abdominal pain, chronic, left lower quadrant    Diabetic gastroparesis associated with type 2 diabetes mellitus (HCC) 04/28/2014   History of Helicobacter pylori infection 04/22/2014   Vaginal discharge 02/18/2014   Unspecified constipation 07/21/2013   Tinea corporis 07/21/2013   Intractable vomiting 04/29/2013   Dysmenorrhea 04/22/2013   UTI (urinary tract infection) 07/15/2012   Headache(784.0) 02/08/2012   Health care maintenance 01/22/2012   Chronic hepatitis B (HCC) 03/07/2011   History of leukocytosis 04/06/2010   THROMBOCYTOSIS 04/06/2010   Polysubstance abuse (HCC) 02/23/2010   Iron deficiency anemia 11/22/2009   PERIPHERAL NEUROPATHY 10/01/2009   Hyperlipidemia 08/30/2009   Diabetic polyneuropathy (HCC) 08/30/2009   Hidradenitis (recurrent boils) 07/07/2008   Depression 12/27/2007   Abdominal pain, left lower quadrant 11/21/2007   FIBROMYALGIA 10/30/2007   BACK PAIN 04/01/2007   OBSTRUCTIVE SLEEP APNEA 01/17/2007   ANXIETY DEPRESSION 06/27/2006   Chronic ischemic heart disease 06/15/2006   OBESITY, MORBID 05/15/2006   MIGRAINE HEADACHE 05/15/2006   Asthma 05/15/2006   Severe hypertension 01/16/2006   IRREGULAR MENSTRUATION 01/16/2006   PEDAL EDEMA 01/16/2006   Type 2 diabetes mellitus with  hyperlipidemia (HCC) 01/16/1989   Home Medication(s) Prior to Admission medications   Medication Sig Start Date End Date Taking? Authorizing Provider  cefadroxil (DURICEF) 500 MG capsule Take 1 capsule (500 mg total) by mouth 2 (two) times daily for 7 days. 12/28/22 01/04/23 Yes Trig Mcbryar, MD  naproxen (NAPROSYN) 375 MG tablet Take 1 tablet (375 mg total) by mouth 2 (two) times daily. 12/28/22  Yes Shonta Bourque, MD  promethazine-dextromethorphan (PROMETHAZINE-DM) 6.25-15 MG/5ML syrup Take 2.5 mLs by mouth 4 (four) times daily as needed for cough. 12/28/22  Yes Zayquan Bogard, MD  albuterol (VENTOLIN HFA) 108 (90 Base) MCG/ACT inhaler Inhale 2 puffs into the lungs every 4 (four) hours as needed for wheezing or shortness of breath. 11/30/19   Zannie Cove, MD  amLODipine (NORVASC) 10 MG tablet Take 1 tablet (10 mg total) by mouth daily. 08/28/22 09/27/22  Robbie Lis M, PA-C  apixaban (ELIQUIS) 5 MG TABS tablet Take 1 tablet (5 mg total) by mouth 2 (two) times daily. 08/28/22   Allayne Butcher, PA-C  aspirin 81 MG chewable tablet Chew 81 mg by mouth daily.    [provider]  atorvastatin (LIPITOR) 40 MG tablet Take 1 tablet (40 mg total) by  mouth daily. 08/28/22 09/27/22  Robbie Lis M, PA-C  Blood Glucose Monitoring Suppl (ONETOUCH VERIO FLEX SYSTEM) w/Device KIT Use in the morning, at noon, and at bedtime. 05/23/22   Arrien, York Ram, MD  carvedilol (COREG) 25 MG tablet Take 1 tablet (25 mg total) by mouth 2 (two) times daily with a meal. 08/28/22 09/27/22  Robbie Lis M, PA-C  cyclobenzaprine (FLEXERIL) 10 MG tablet Take 10 mg by mouth 3 (three) times daily as needed for muscle spasms.    [provider]  dicyclomine (BENTYL) 10 MG capsule Take 1 capsule (10 mg total) by mouth 4 (four) times daily -  before meals and at bedtime. 08/20/21   Elson Areas, PA-C  diphenhydrAMINE (BENADRYL) 25 MG tablet Take 25 mg by mouth every 6 (six) hours as needed for  allergies.    [provider]  famotidine (PEPCID) 20 MG tablet Take 20 mg by mouth at bedtime. 04/11/21   [provider]  ferrous sulfate 325 (65 FE) MG tablet Take 325 mg by mouth daily with breakfast.    [provider]  furosemide (LASIX) 20 MG tablet TAKE 3 TABLETS(60 MG) BY MOUTH TWICE DAILY 11/22/22   Bensimhon, Bevelyn Buckles, MD  glucose blood (ONETOUCH VERIO) test strip Use as instructed to check blood sugar 3 times daily. Dx codes: E11.8, E11.65 Dec 13, 2018   Hoy Register, MD  hydrALAZINE (APRESOLINE) 100 MG tablet Take 1 tablet (100 mg total) by mouth every 8 (eight) hours. 08/28/22   Robbie Lis M, PA-C  Insulin Pen Needle (PEN NEEDLES 3/16") 31G X 5 MM MISC 1 Device by Does not apply route 4 (four) times daily - after meals and at bedtime. 11/28/18   Grayce Sessions, NP  INSULIN SYRINGE .5CC/28G (INS SYRINGE/NEEDLE .5CC/28G) 28G X 1/2" 0.5 ML MISC Please provide 1 month supply 09/04/17   Scherrie Gerlach, MD  isosorbide mononitrate (IMDUR) 30 MG 24 hr tablet Take 3 tablets (90 mg total) by mouth daily. 08/28/22 09/27/22  Allayne Butcher, PA-C  linaclotide (LINZESS) 145 MCG CAPS capsule Take 145 mcg by mouth daily as needed (for constipation).    [provider]  meclizine (ANTIVERT) 25 MG tablet Take 1 tablet (25 mg total) by mouth 3 (three) times daily as needed for dizziness. 04/26/21   Glade Lloyd, MD  omeprazole (PRILOSEC) 40 MG capsule Take 40 mg by mouth daily before breakfast. 03/10/21   [provider]  OneTouch Delica Lancets 33G MISC Use to check blood sugar 3 times daily. Dx codes: E11.8, E11.65 December 13, 2018   Hoy Register, MD  oxyCODONE-acetaminophen (PERCOCET) 10-325 MG tablet Take 1 tablet by mouth 3 (three) times daily.    [provider]  senna (SENOKOT) 8.6 MG TABS tablet Take 1 tablet (8.6 mg total) by mouth 2 (two) times daily. 04/26/21   Glade Lloyd, MD  Past Surgical History Past Surgical History:  Procedure Laterality Date   CESAREAN SECTION  1997   CORONARY ANGIOPLASTY WITH STENT PLACEMENT  2008   "2 stents"   ESOPHAGOGASTRODUODENOSCOPY N/A 04/23/2014   Procedure: ESOPHAGOGASTRODUODENOSCOPY (EGD);  Surgeon: Vertell Novak., MD;  Location: [facility] ENDOSCOPY;  Service: Endoscopy;  Laterality: N/A;   INCISION AND DRAINAGE ABSCESS Right 04/17/2021   Procedure: INCISION AND DRAINAGE CHEST WALL ABSCESS;  Surgeon: Diamantina Monks, MD;  Location: [facility] OR;  Service: General;  Laterality: Right;   IR ANGIOGRAM PELVIS SELECTIVE OR SUPRASELECTIVE  12/07/2016   IR ANGIOGRAM PELVIS SELECTIVE OR SUPRASELECTIVE  12/07/2016   IR ANGIOGRAM SELECTIVE EACH ADDITIONAL VESSEL  12/07/2016   IR ANGIOGRAM SELECTIVE EACH ADDITIONAL VESSEL  12/07/2016   IR EMBO ARTERIAL NOT HEMORR HEMANG INC GUIDE ROADMAPPING  12/07/2016   IR RADIOLOGIST EVAL & MGMT  01/09/2017   IR US GUIDE VASC ACCESS RIGHT  12/07/2016   IRRIGATION AND DEBRIDEMENT FOOT Right 04/07/2020   Procedure: Incision and Drainage Right Foot;  Surgeon: Park Liter, DPM;  Location: [facility] ORS;  Service: Podiatry;  Laterality: Right;   RIGHT HEART CATH N/A 08/14/2022   Procedure: RIGHT HEART CATH;  Surgeon: Runell Gess, MD;  Location: [facility] INVASIVE CV LAB;  Service: Cardiovascular;  Laterality: N/A;   Family History Family History  Problem Relation Age of Onset   Diabetes Father    Healthy Mother     Social History Social History   Tobacco Use   Smoking status: Former    Current packs/day: 0.00    Types: Cigarettes    Quit date: 04/24/1996    Years since quitting: 26.6   Smokeless tobacco: Never   Tobacco comments:    quit smoking cigarettes age 23  Vaping Use   Vaping status: Never Used  Substance Use Topics   Alcohol use: No    Alcohol/week: 0.0 standard drinks of alcohol    Comment: 04/22/2014 "might have a few drinks a  month"   Drug use: Not Currently    Types: Marijuana, Cocaine    Comment: 04/22/2104 "quit drugs ~ 1-2 yr ago"   Allergies Morphine and codeine and Zestril [lisinopril]  Review of Systems Review of Systems  Constitutional:  Positive for fatigue and fever.  HENT:  Positive for congestion.   Skin:  Positive for rash.  Neurological:  Positive for headaches.    Physical Exam Vital Signs  I have reviewed the triage vital signs BP (!) 165/103   Pulse 85   Temp 98.4 F (36.9 C) (Oral)   Resp 18   Ht 5\' 7"  (1.702 m)   Wt 90.7 kg   LMP 01/22/2021 Comment: neg preg test 04/17/21  SpO2 100%   BMI 31.32 kg/m   Physical Exam Vitals and nursing note reviewed.  Constitutional:      General: She is not in acute distress.    Appearance: She is well-developed.  HENT:     Head: Normocephalic and atraumatic.  Eyes:     Conjunctiva/sclera: Conjunctivae normal.  Cardiovascular:     Rate and Rhythm: Normal rate and regular rhythm.     Heart sounds: No murmur heard. Pulmonary:     Effort: Pulmonary effort is normal. No respiratory distress.     Breath sounds: Normal breath sounds.  Abdominal:     Palpations: Abdomen is soft.     Tenderness: There is no abdominal tenderness.  Musculoskeletal:        General: No swelling.  Cervical back: Neck supple.  Skin:    General: Skin is warm and dry.     Capillary Refill: Capillary refill takes less than 2 seconds.     Findings: Lesion and rash present.  Neurological:     Mental Status: She is alert.  Psychiatric:        Mood and Affect: Mood normal.     ED Results and Treatments Labs (all labs ordered are listed, but only abnormal results are displayed) Labs Reviewed  SARS CORONAVIRUS 2 BY RT PCR - Abnormal; Notable for the following components:      Result Value   SARS Coronavirus 2 by RT PCR POSITIVE (*)    All other components within normal limits  GROUP A STREP BY PCR                                                                                                                           Radiology No results found.  Pertinent labs & imaging results that were available during my care of the patient were reviewed by me and considered in my medical decision making (see MDM for details).  Medications Ordered in ED Medications  cefadroxil (DURICEF) capsule 500 mg (500 mg Oral Given 12/28/22 1452)  prochlorperazine (COMPAZINE) injection 10 mg (10 mg Intravenous Given 12/28/22 1450)  diphenhydrAMINE (BENADRYL) injection 25 mg (25 mg Intravenous Given 12/28/22 1451)  lactated ringers bolus 1,000 mL (0 mLs Intravenous Stopped 12/28/22 1711)  lidocaine-EPINEPHrine (XYLOCAINE W/EPI) 2 %-1:200000 (PF) injection 10 mL (10 mLs Intradermal Given 12/28/22 1451)                                                                                                                                     Procedures .Marland KitchenIncision and Drainage  Date/Time: 12/28/2022 5:52 PM  Performed by: Glendora Score, MD Authorized by: Glendora Score, MD   Location:    Type:  Abscess   Size:  1 cm   Location: breast. Pre-procedure details:    Skin preparation:  Chlorhexidine with alcohol Sedation:    Sedation type:  None Anesthesia:    Anesthesia method:  Local infiltration   Local anesthetic:  Lidocaine 2% WITH epi Procedure type:    Complexity:  Simple Procedure details:    Incision types:  Single straight   Drainage:  Purulent   Drainage amount:  Moderate   Wound treatment:  Wound left  open Post-procedure details:    Procedure completion:  Tolerated well, no immediate complications   (including critical care time)  Medical Decision Making / ED Course   This patient presents to the ED for concern of myalgias, nasal congestion, headache, breast pain, this involves an extensive number of treatment options, and is a complaint that carries with it a high risk of complications and morbidity.  The differential diagnosis includes viral illness,  COVID-19, influenza, RSV, strep, electrolyte abnormality, breast abscess, molluscum  MDM: Patient seen emergency room for evaluation of multiple complaints as described above.  Physical exam with a raised papular rash over the upper extremities consistent with molluscum contagiosum.  There is also a 1 cm palpable tender erythematous lesion at the breast fold on the left.  Neurologic exam unremarkable with no focal motor or sensory deficits.  Patient is COVID-positive and this likely explains the majority of her symptoms.  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  A bedside ultrasound was performed showing a drainable fluid collection under the breast and I performed a bedside I&D with evacuation of purulent fluid.  Patient placed on Duricef.  Patient given headache cocktail and on reevaluation symptoms have significant improved.  She is not hypoxic and does not meet inpatient criteria for admission.  Patient then discharged with outpatient follow-up.  Return precautions given of which she voiced understanding.   Additional history obtained:  -External records from outside source obtained and reviewed including: Chart review including previous notes, labs, imaging, consultation notes   Lab Tests: -I ordered, reviewed, and interpreted labs.   The pertinent results include:   Labs Reviewed  SARS CORONAVIRUS 2 BY RT PCR - Abnormal; Notable for the following components:      Result Value   SARS Coronavirus 2 by RT PCR POSITIVE (*)    All other components within normal limits  GROUP A STREP BY PCR      EKG   EKG Interpretation Date/Time:  Thursday December 28 2022 10:59:47 EDT Ventricular Rate:  108 PR Interval:  134 QRS Duration:  74 QT Interval:  348 QTC Calculation: 466 R Axis:   43  Text Interpretation: Sinus tachycardia Abnormal ECG When compared with ECG of 15-Sep-2022 13:52, PREVIOUS ECG IS PRESENT  Confirmed by Everest Hacking ([id number]) on 12/28/2022 2:21:56 PM        Medicines ordered and prescription drug management: Meds ordered this encounter  Medications   prochlorperazine (COMPAZINE) injection 10 mg   diphenhydrAMINE (BENADRYL) injection 25 mg   lactated ringers bolus 1,000 mL   lidocaine-EPINEPHrine (XYLOCAINE W/EPI) 2 %-1:200000 (PF) injection 10 mL   cefadroxil (DURICEF) capsule 500 mg   cefadroxil (DURICEF) 500 MG capsule    Sig: Take 1 capsule (500 mg total) by mouth 2 (two) times daily for 7 days.    Dispense:  14 capsule    Refill:  0   naproxen (NAPROSYN) 375 MG tablet    Sig: Take 1 tablet (375 mg total) by mouth 2 (two) times daily.    Dispense:  20 tablet    Refill:  0   promethazine-dextromethorphan (PROMETHAZINE-DM) 6.25-15 MG/5ML syrup    Sig: Take 2.5 mLs by mouth 4 (four) times daily as needed for cough.    Dispense:  118 mL    Refill:  0    -I have reviewed the patients home medicines and have made adjustments as needed  Critical interventions none   Cardiac  Monitoring: The patient was maintained on a cardiac monitor.  I personally viewed and interpreted the cardiac monitored which showed an underlying rhythm of: NSR  Social Determinants of Health:  Factors impacting patients care include: Multiple viral exposures, grandkids in daycare   Reevaluation: After the interventions noted above, I reevaluated the patient and found that they have :improved  Co morbidities that complicate the patient evaluation  Past Medical History:  Diagnosis Date   Abscess of tunica vaginalis    10/09- Abundant S. aureus- sensitive to all abx   Anxiety    Blood dyscrasia    CAD (coronary artery disease) 06/15/2006   s/p Subendocardial MI with PDA angioplasty(no stent) on 06/15/06 and relook  cath 06/19/06 showed patency of site. Cath 12/10- no restenosis or significant CAD progression   CVA (cerebral vascular accident) (HCC) ~ 02/2014   denies residual on 04/22/2014    [facility])    history of remote right cerebellar infarct noted on head CT at least since 10/2011   Depression    Diabetes mellitus type 2, uncontrolled, with complications    Fibromyalgia    Gastritis    Gastroparesis    secondary to poorly controlled DM, last emptying study performed 01/2010  was normal but may be falsely positive as pt was on reglan   GERD (gastroesophageal reflux disease)    Hepatitis B, chronic (HCC)    Hep BeAb+,Hep B cAb+ & Hep BsAg+ (9/06)   History of pyelonephritis    H/o GrpB Pyelonephritis (9/06) and UTI- 07/11- E.Coli, 12/10- GBS   Hyperlipidemia    Hypertension    Iron deficiency anemia    Irregular menses    Small ovarian follicles seen on CT(9/06)   MI (myocardial infarction) (HCC) 05/2006   PDA percutaneous transluminal coronary angioplasty   Migraine    "weekly" (04/22/2014)   N&V (nausea and vomiting)    Chronic. Unclear etiology with multiple admission and ED visits. CT abdomen with and without contrast (02/2011)  showed no acute process. Gastic Emptying scan (01/2010) was normal. Ultrasound of the abdomen was within normal limits. Hepatitis B viral load was undectable. HIV NR. EGD - gastritis, Hpylori + s/p Rx   New onset of congestive heart failure (HCC) 05/16/2022   Obesity    OSA (obstructive sleep apnea)    "suppose to wear mask but I don't" (04/22/2014)   Peripheral neuropathy    Pneumonia    "this is probably the 2nd or 3rd time I've had pneumonia" (04/22/2014)   Recurrent boils    Seasonal asthma    Substance abuse (HCC)    Thrombocytosis    Hem/Onc suggested 2/2 chronic hepatits and/or iron deficiency anemia      Dispostion: I considered admission for this patient, but at this time she does not meet inpatient criteria for admission she is safe for discharge with outpatient follow-up     Final Clinical Impression(s) / ED Diagnoses Final diagnoses:  COVID  Breast abscess  Molluscum contagiosum      @PCDICTATION @    Glendora Score, MD 12/28/22 1755

## 2023-01-16 ENCOUNTER — Encounter (HOSPITAL_BASED_OUTPATIENT_CLINIC_OR_DEPARTMENT_OTHER): Payer: Self-pay

## 2023-03-29 ENCOUNTER — Other Ambulatory Visit: Payer: Self-pay

## 2023-03-29 DIAGNOSIS — N184 Chronic kidney disease, stage 4 (severe): Secondary | ICD-10-CM

## 2023-04-04 ENCOUNTER — Other Ambulatory Visit (HOSPITAL_COMMUNITY): Payer: Self-pay

## 2023-04-10 ENCOUNTER — Ambulatory Visit (HOSPITAL_COMMUNITY): Payer: 59 | Attending: Vascular Surgery

## 2023-04-10 ENCOUNTER — Ambulatory Visit (HOSPITAL_COMMUNITY): Payer: 59

## 2023-04-11 LAB — LAB REPORT - SCANNED
Creatinine, POC: 91.1 mg/dL
EGFR: 11

## 2023-04-15 ENCOUNTER — Encounter (HOSPITAL_COMMUNITY): Payer: Self-pay

## 2023-04-15 ENCOUNTER — Other Ambulatory Visit: Payer: Self-pay

## 2023-04-15 ENCOUNTER — Emergency Department (HOSPITAL_COMMUNITY): Payer: 59

## 2023-04-15 ENCOUNTER — Inpatient Hospital Stay (HOSPITAL_COMMUNITY)
Admission: EM | Admit: 2023-04-15 | Discharge: 2023-04-26 | DRG: 871 | Disposition: A | Discharge status: Home Health | Payer: 59 | Attending: Internal Medicine | Admitting: Internal Medicine

## 2023-04-15 DIAGNOSIS — A4189 Other specified sepsis: Secondary | ICD-10-CM | POA: Diagnosis present

## 2023-04-15 DIAGNOSIS — M797 Fibromyalgia: Secondary | ICD-10-CM | POA: Diagnosis present

## 2023-04-15 DIAGNOSIS — I25119 Atherosclerotic heart disease of native coronary artery with unspecified angina pectoris: Secondary | ICD-10-CM | POA: Diagnosis present

## 2023-04-15 DIAGNOSIS — N184 Chronic kidney disease, stage 4 (severe): Secondary | ICD-10-CM | POA: Diagnosis present

## 2023-04-15 DIAGNOSIS — Z79891 Long term (current) use of opiate analgesic: Secondary | ICD-10-CM

## 2023-04-15 DIAGNOSIS — E119 Type 2 diabetes mellitus without complications: Secondary | ICD-10-CM | POA: Diagnosis not present

## 2023-04-15 DIAGNOSIS — E860 Dehydration: Secondary | ICD-10-CM | POA: Diagnosis present

## 2023-04-15 DIAGNOSIS — J101 Influenza due to other identified influenza virus with other respiratory manifestations: Secondary | ICD-10-CM | POA: Diagnosis present

## 2023-04-15 DIAGNOSIS — Z6830 Body mass index (BMI) 30.0-30.9, adult: Secondary | ICD-10-CM

## 2023-04-15 DIAGNOSIS — R6520 Severe sepsis without septic shock: Secondary | ICD-10-CM | POA: Diagnosis present

## 2023-04-15 DIAGNOSIS — D72829 Elevated white blood cell count, unspecified: Secondary | ICD-10-CM | POA: Diagnosis present

## 2023-04-15 DIAGNOSIS — E1143 Type 2 diabetes mellitus with diabetic autonomic (poly)neuropathy: Secondary | ICD-10-CM | POA: Diagnosis present

## 2023-04-15 DIAGNOSIS — Z86711 Personal history of pulmonary embolism: Secondary | ICD-10-CM | POA: Diagnosis not present

## 2023-04-15 DIAGNOSIS — Z1152 Encounter for screening for COVID-19: Secondary | ICD-10-CM

## 2023-04-15 DIAGNOSIS — I10 Essential (primary) hypertension: Secondary | ICD-10-CM | POA: Diagnosis not present

## 2023-04-15 DIAGNOSIS — R7989 Other specified abnormal findings of blood chemistry: Secondary | ICD-10-CM | POA: Diagnosis present

## 2023-04-15 DIAGNOSIS — D509 Iron deficiency anemia, unspecified: Secondary | ICD-10-CM | POA: Diagnosis present

## 2023-04-15 DIAGNOSIS — Z992 Dependence on renal dialysis: Secondary | ICD-10-CM | POA: Diagnosis not present

## 2023-04-15 DIAGNOSIS — B181 Chronic viral hepatitis B without delta-agent: Secondary | ICD-10-CM | POA: Diagnosis present

## 2023-04-15 DIAGNOSIS — K219 Gastro-esophageal reflux disease without esophagitis: Secondary | ICD-10-CM | POA: Diagnosis present

## 2023-04-15 DIAGNOSIS — K3184 Gastroparesis: Secondary | ICD-10-CM | POA: Diagnosis present

## 2023-04-15 DIAGNOSIS — J111 Influenza due to unidentified influenza virus with other respiratory manifestations: Secondary | ICD-10-CM | POA: Diagnosis present

## 2023-04-15 DIAGNOSIS — R0789 Other chest pain: Secondary | ICD-10-CM | POA: Diagnosis not present

## 2023-04-15 DIAGNOSIS — Z789 Other specified health status: Secondary | ICD-10-CM

## 2023-04-15 DIAGNOSIS — M898X9 Other specified disorders of bone, unspecified site: Secondary | ICD-10-CM | POA: Diagnosis present

## 2023-04-15 DIAGNOSIS — E1122 Type 2 diabetes mellitus with diabetic chronic kidney disease: Secondary | ICD-10-CM | POA: Diagnosis present

## 2023-04-15 DIAGNOSIS — F419 Anxiety disorder, unspecified: Secondary | ICD-10-CM | POA: Diagnosis present

## 2023-04-15 DIAGNOSIS — G9341 Metabolic encephalopathy: Secondary | ICD-10-CM | POA: Diagnosis present

## 2023-04-15 DIAGNOSIS — Z7982 Long term (current) use of aspirin: Secondary | ICD-10-CM

## 2023-04-15 DIAGNOSIS — N186 End stage renal disease: Secondary | ICD-10-CM | POA: Diagnosis not present

## 2023-04-15 DIAGNOSIS — Z713 Dietary counseling and surveillance: Secondary | ICD-10-CM

## 2023-04-15 DIAGNOSIS — G4733 Obstructive sleep apnea (adult) (pediatric): Secondary | ICD-10-CM | POA: Diagnosis present

## 2023-04-15 DIAGNOSIS — N185 Chronic kidney disease, stage 5: Secondary | ICD-10-CM | POA: Diagnosis not present

## 2023-04-15 DIAGNOSIS — Z888 Allergy status to other drugs, medicaments and biological substances status: Secondary | ICD-10-CM

## 2023-04-15 DIAGNOSIS — N179 Acute kidney failure, unspecified: Secondary | ICD-10-CM | POA: Diagnosis present

## 2023-04-15 DIAGNOSIS — Z885 Allergy status to narcotic agent status: Secondary | ICD-10-CM

## 2023-04-15 DIAGNOSIS — Z79899 Other long term (current) drug therapy: Secondary | ICD-10-CM

## 2023-04-15 DIAGNOSIS — D631 Anemia in chronic kidney disease: Secondary | ICD-10-CM | POA: Diagnosis present

## 2023-04-15 DIAGNOSIS — E8721 Acute metabolic acidosis: Secondary | ICD-10-CM | POA: Diagnosis present

## 2023-04-15 DIAGNOSIS — R072 Precordial pain: Secondary | ICD-10-CM | POA: Diagnosis present

## 2023-04-15 DIAGNOSIS — R34 Anuria and oliguria: Secondary | ICD-10-CM | POA: Diagnosis not present

## 2023-04-15 DIAGNOSIS — I132 Hypertensive heart and chronic kidney disease with heart failure and with stage 5 chronic kidney disease, or end stage renal disease: Secondary | ICD-10-CM | POA: Diagnosis present

## 2023-04-15 DIAGNOSIS — A419 Sepsis, unspecified organism: Secondary | ICD-10-CM | POA: Diagnosis present

## 2023-04-15 DIAGNOSIS — E782 Mixed hyperlipidemia: Secondary | ICD-10-CM

## 2023-04-15 DIAGNOSIS — E66812 Obesity, class 2: Secondary | ICD-10-CM | POA: Diagnosis present

## 2023-04-15 DIAGNOSIS — F32A Depression, unspecified: Secondary | ICD-10-CM | POA: Diagnosis present

## 2023-04-15 DIAGNOSIS — I252 Old myocardial infarction: Secondary | ICD-10-CM

## 2023-04-15 DIAGNOSIS — Z87891 Personal history of nicotine dependence: Secondary | ICD-10-CM

## 2023-04-15 DIAGNOSIS — Z794 Long term (current) use of insulin: Secondary | ICD-10-CM

## 2023-04-15 DIAGNOSIS — Z833 Family history of diabetes mellitus: Secondary | ICD-10-CM

## 2023-04-15 DIAGNOSIS — I2782 Chronic pulmonary embolism: Secondary | ICD-10-CM | POA: Diagnosis present

## 2023-04-15 DIAGNOSIS — I5022 Chronic systolic (congestive) heart failure: Secondary | ICD-10-CM | POA: Diagnosis present

## 2023-04-15 DIAGNOSIS — I959 Hypotension, unspecified: Secondary | ICD-10-CM | POA: Diagnosis not present

## 2023-04-15 DIAGNOSIS — Z955 Presence of coronary angioplasty implant and graft: Secondary | ICD-10-CM

## 2023-04-15 DIAGNOSIS — Z7901 Long term (current) use of anticoagulants: Secondary | ICD-10-CM

## 2023-04-15 DIAGNOSIS — E785 Hyperlipidemia, unspecified: Secondary | ICD-10-CM | POA: Diagnosis present

## 2023-04-15 DIAGNOSIS — N189 Chronic kidney disease, unspecified: Secondary | ICD-10-CM

## 2023-04-15 DIAGNOSIS — Z8673 Personal history of transient ischemic attack (TIA), and cerebral infarction without residual deficits: Secondary | ICD-10-CM

## 2023-04-15 DIAGNOSIS — I82531 Chronic embolism and thrombosis of right popliteal vein: Secondary | ICD-10-CM | POA: Diagnosis present

## 2023-04-15 DIAGNOSIS — G9340 Encephalopathy, unspecified: Secondary | ICD-10-CM | POA: Diagnosis not present

## 2023-04-15 LAB — COMPREHENSIVE METABOLIC PANEL
ALT: 14 U/L (ref 0–44)
AST: 38 U/L (ref 15–41)
Albumin: 2.5 g/dL — ABNORMAL LOW (ref 3.5–5.0)
Alkaline Phosphatase: 64 U/L (ref 38–126)
Anion gap: 11 (ref 5–15)
BUN: 42 mg/dL — ABNORMAL HIGH (ref 6–20)
CO2: 17 mmol/L — ABNORMAL LOW (ref 22–32)
Calcium: 8.6 mg/dL — ABNORMAL LOW (ref 8.9–10.3)
Chloride: 112 mmol/L — ABNORMAL HIGH (ref 98–111)
Creatinine, Ser: 4.51 mg/dL — ABNORMAL HIGH (ref 0.44–1.00)
GFR, Estimated: 11 mL/min — ABNORMAL LOW (ref >60)
Glucose, Bld: 105 mg/dL — ABNORMAL HIGH (ref 70–99)
Potassium: 4.5 mmol/L (ref 3.5–5.1)
Sodium: 140 mmol/L (ref 135–145)
Total Bilirubin: 1.2 mg/dL — ABNORMAL HIGH (ref <1.2)
Total Protein: 7 g/dL (ref 6.5–8.1)

## 2023-04-15 LAB — CBC WITH DIFFERENTIAL/PLATELET
Abs Immature Granulocytes: 0.1 10*3/uL — ABNORMAL HIGH (ref 0.00–0.07)
Basophils Absolute: 0.1 10*3/uL (ref 0.0–0.1)
Basophils Relative: 0 %
Eosinophils Absolute: 0.1 10*3/uL (ref 0.0–0.5)
Eosinophils Relative: 1 %
HCT: 28.1 % — ABNORMAL LOW (ref 36.0–46.0)
Hemoglobin: 9.4 g/dL — ABNORMAL LOW (ref 12.0–15.0)
Immature Granulocytes: 1 %
Lymphocytes Relative: 10 %
Lymphs Abs: 1.8 10*3/uL (ref 0.7–4.0)
MCH: 26.7 pg (ref 26.0–34.0)
MCHC: 33.5 g/dL (ref 30.0–36.0)
MCV: 79.8 fL — ABNORMAL LOW (ref 80.0–100.0)
Monocytes Absolute: 1 10*3/uL (ref 0.1–1.0)
Monocytes Relative: 6 %
Neutro Abs: 14.5 10*3/uL — ABNORMAL HIGH (ref 1.7–7.7)
Neutrophils Relative %: 82 %
Platelets: 390 10*3/uL (ref 150–400)
RBC: 3.52 MIL/uL — ABNORMAL LOW (ref 3.87–5.11)
RDW: 16.2 % — ABNORMAL HIGH (ref 11.5–15.5)
WBC: 17.5 10*3/uL — ABNORMAL HIGH (ref 4.0–10.5)
nRBC: 0 % (ref 0.0–0.2)

## 2023-04-15 LAB — APTT: aPTT: 25 s (ref 24–36)

## 2023-04-15 LAB — BRAIN NATRIURETIC PEPTIDE: B Natriuretic Peptide: 1788.7 pg/mL — ABNORMAL HIGH (ref 0.0–100.0)

## 2023-04-15 LAB — RESP PANEL BY RT-PCR (RSV, FLU A&B, COVID)  RVPGX2
Influenza A by PCR: POSITIVE — AB
Influenza B by PCR: NEGATIVE
Resp Syncytial Virus by PCR: NEGATIVE
SARS Coronavirus 2 by RT PCR: NEGATIVE

## 2023-04-15 LAB — TROPONIN I (HIGH SENSITIVITY): Troponin I (High Sensitivity): 61 ng/L — ABNORMAL HIGH (ref <18)

## 2023-04-15 LAB — HEPARIN LEVEL (UNFRACTIONATED): Heparin Unfractionated: <0.1 [IU]/mL — ABNORMAL LOW (ref 0.30–0.70)

## 2023-04-15 LAB — D-DIMER, QUANTITATIVE: D-Dimer, Quant: 1.95 ug{FEU}/mL — ABNORMAL HIGH (ref 0.00–0.50)

## 2023-04-15 MED ORDER — HEPARIN (PORCINE) 25000 UT/250ML-% IV SOLN
1500.0000 [IU]/h | INTRAVENOUS | Status: DC
  Administered 2023-04-15: 1300 [IU]/h via INTRAVENOUS
  Filled 2023-04-15: qty 250

## 2023-04-15 MED ORDER — ACETAMINOPHEN 650 MG RE SUPP: 650.0000 mg | Freq: Four times a day (QID) | RECTAL | Status: DC | PRN

## 2023-04-15 MED ORDER — ONDANSETRON HCL 4 MG/2ML IJ SOLN
4.0000 mg | Freq: Four times a day (QID) | INTRAMUSCULAR | Status: DC | PRN
  Administered 2023-04-16 – 2023-04-26 (×9): 4 mg via INTRAVENOUS
  Filled 2023-04-15 (×9): qty 2

## 2023-04-15 MED ORDER — NALOXONE HCL 0.4 MG/ML IJ SOLN
0.4000 mg | INTRAMUSCULAR | Status: DC | PRN
  Administered 2023-04-16 (×2): 0.2 mg via INTRAVENOUS
  Filled 2023-04-15: qty 1

## 2023-04-15 MED ORDER — ACETAMINOPHEN 325 MG PO TABS
650.0000 mg | ORAL_TABLET | Freq: Four times a day (QID) | ORAL | Status: DC | PRN
  Administered 2023-04-16: 650 mg via ORAL
  Filled 2023-04-15: qty 2

## 2023-04-15 MED ORDER — HEPARIN BOLUS VIA INFUSION
4800.0000 [IU] | Freq: Once | INTRAVENOUS | Status: AC
  Administered 2023-04-15: 4800 [IU] via INTRAVENOUS
  Filled 2023-04-15: qty 4800

## 2023-04-15 MED ORDER — MELATONIN 3 MG PO TABS
3.0000 mg | ORAL_TABLET | Freq: Every evening | ORAL | Status: DC | PRN
Start: 2023-04-15 — End: 2023-04-16

## 2023-04-15 MED ORDER — SODIUM CHLORIDE 0.9 % IV SOLN
INTRAVENOUS | Status: DC
Start: 2023-04-15 — End: 2023-04-16

## 2023-04-15 MED ORDER — ALBUTEROL SULFATE HFA 108 (90 BASE) MCG/ACT IN AERS: 2.0000 | INHALATION_SPRAY | RESPIRATORY_TRACT | Status: DC | PRN

## 2023-04-15 MED ORDER — OXYCODONE-ACETAMINOPHEN 5-325 MG PO TABS
1.0000 | ORAL_TABLET | Freq: Four times a day (QID) | ORAL | Status: DC | PRN
  Administered 2023-04-16: 1 via ORAL
  Filled 2023-04-15: qty 1

## 2023-04-15 NOTE — ED Notes (Signed)
Patient transported to X-ray @2038 .

## 2023-04-15 NOTE — ED Triage Notes (Signed)
Patient brought in by EMS from home with CP and shortness of breath. Was seen at another facility for this where they gave her prednisone and azithromycin. She said both ended today but she does not feel any better. Reports a dry cough.

## 2023-04-15 NOTE — Progress Notes (Addendum)
PHARMACY - ANTICOAGULATION CONSULT NOTE  Pharmacy Consult for heparin Indication: VTE   Allergies  Allergen Reactions   Morphine And Codeine Itching and Swelling   Zestril [Lisinopril] Nausea Only and Swelling    Patient Measurements: Height: 5\' 7"  (170.2 cm) Weight: 89.4 kg (197 lb 1.5 oz) IBW/kg (Calculated) : 61.6 Heparin Dosing Weight: 80.7kg  Vital Signs: Temp: 98.3 F (36.8 C) (12/22 2030) Temp Source: Oral (12/22 2030) BP: 185/74 (12/22 2145) Pulse Rate: 104 (12/22 2145)  Labs: Recent Labs    04/15/23 2122  HGB 9.4*  HCT 28.1*  PLT 390  CREATININE 4.51*  TROPONINIHS 61*    Estimated Creatinine Clearance: 17.3 mL/min (A) (by C-G formula based on SCr of 4.51 mg/dL (H)).  Assessment: 69 yoF presented to ED with chest pain and shortness of breath. PMH includes: HFrEF (45-50%), CKD Stage 4, CAD s/p PCI, stroke, T2DM, h/o DVT/PE (h/o eliquis >> patient states no longer taking)Ms. Pharmacy consulted to dose heparin for history of DVT/PE.  -Hgb 9.4, plts 390, trop 61 -Fill history shows last apixaban fill 10/2022 -Baseline aPTT 25 seconds and heparin level <0.1 (correlated)  Goal of Therapy:  Heparin level 0.3-0.7 units/ml Monitor platelets by anticoagulation protocol: Yes   Plan:  Give 4800 units bolus x 1 Start heparin infusion at 1300 units/hr Check anti-Xa level in 8 hours and daily while on heparin  Continue to monitor H&H and platelets  Arabella Merles, PharmD. Clinical Pharmacist 04/15/2023 11:11 PM

## 2023-04-15 NOTE — H&P (Incomplete)
History and Physical      Shirley [name]:[id number] DOB: January 29, 1976 DOA: 04/15/2023; DOS: 04/15/2023  PCP: Bettey Costa, NP *** Patient coming from: home ***  I have personally briefly reviewed patient's old medical records in [facility]  Chief Complaint: ***  HPI: Shirley [name] is a 47 y.o. female with medical history significant for *** who is admitted to [facility] on 04/15/2023 with *** after presenting from home*** to [facility] ED complaining of ***.    ***       ***   ED Course:  Vital signs in the ED were notable for the following: ***  Labs were notable for the following: ***  Per my interpretation, EKG in ED demonstrated the following:  ***  Imaging in the ED, per corresponding formal radiology read, was notable for the following:  ***  While in the ED, the following were administered: ***  Subsequently, the patient was admitted  ***  ***red    Review of Systems: As per HPI otherwise 10 point review of systems negative.   Past Medical History:  Diagnosis Date   Abscess of tunica vaginalis    10/09- Abundant S. aureus- sensitive to all abx   Anxiety    Blood dyscrasia    CAD (coronary artery disease) 06/15/2006   s/p Subendocardial MI with PDA angioplasty(no stent) on 06/15/06 and relook  cath 06/19/06 showed patency of site. Cath 12/10- no restenosis or significant CAD progression   CVA (cerebral vascular accident) (HCC) ~ 02/2014   denies residual on 04/22/2014   CVA (cerebral vascular accident) [facility])    history of remote right cerebellar infarct noted on head CT at least since 10/2011   Depression    Diabetes mellitus type 2, uncontrolled, with complications    Fibromyalgia    Gastritis    Gastroparesis    secondary to poorly controlled DM, last emptying study performed 01/2010  was normal but may be falsely positive as pt was on reglan   GERD (gastroesophageal reflux disease)    Hepatitis B, chronic (HCC)    Hep BeAb+,Hep B  cAb+ & Hep BsAg+ (9/06)   History of pyelonephritis    H/o GrpB Pyelonephritis (9/06) and UTI- 07/11- E.Coli, 12/10- GBS   Hyperlipidemia    Hypertension    Iron deficiency anemia    Irregular menses    Small ovarian follicles seen on CT(9/06)   MI (myocardial infarction) (HCC) 05/2006   PDA percutaneous transluminal coronary angioplasty   Migraine    "weekly" (04/22/2014)   N&V (nausea and vomiting)    Chronic. Unclear etiology with multiple admission and ED visits. CT abdomen with and without contrast (02/2011)  showed no acute process. Gastic Emptying scan (01/2010) was normal. Ultrasound of the abdomen was within normal limits. Hepatitis B viral load was undectable. HIV NR. EGD - gastritis, Hpylori + s/p Rx   New onset of congestive heart failure (HCC) 05/16/2022   Obesity    OSA (obstructive sleep apnea)    "suppose to wear mask but I don't" (04/22/2014)   Peripheral neuropathy    Pneumonia    "this is probably the 2nd or 3rd time I've had pneumonia" (04/22/2014)   Recurrent boils    Seasonal asthma    Substance abuse (HCC)    Thrombocytosis    Hem/Onc suggested 2/2 chronic hepatits and/or iron deficiency anemia    Past Surgical History:  Procedure Laterality Date   CESAREAN SECTION  1997   CORONARY ANGIOPLASTY  WITH STENT PLACEMENT  2008   "2 stents"   ESOPHAGOGASTRODUODENOSCOPY N/A 04/23/2014   Procedure: ESOPHAGOGASTRODUODENOSCOPY (EGD);  Surgeon: Vertell Novak., MD;  Location: [facility] ENDOSCOPY;  Service: Endoscopy;  Laterality: N/A;   INCISION AND DRAINAGE ABSCESS Right 04/17/2021   Procedure: INCISION AND DRAINAGE CHEST WALL ABSCESS;  Surgeon: Diamantina Monks, MD;  Location: [facility] OR;  Service: General;  Laterality: Right;   IR ANGIOGRAM PELVIS SELECTIVE OR SUPRASELECTIVE  12/07/2016   IR ANGIOGRAM PELVIS SELECTIVE OR SUPRASELECTIVE  12/07/2016   IR ANGIOGRAM SELECTIVE EACH ADDITIONAL VESSEL  12/07/2016   IR ANGIOGRAM SELECTIVE EACH ADDITIONAL VESSEL  12/07/2016   IR EMBO  ARTERIAL NOT HEMORR HEMANG INC GUIDE ROADMAPPING  12/07/2016   IR RADIOLOGIST EVAL & MGMT  01/09/2017   IR US GUIDE VASC ACCESS RIGHT  12/07/2016   IRRIGATION AND DEBRIDEMENT FOOT Right 04/07/2020   Procedure: Incision and Drainage Right Foot;  Surgeon: Park Liter, DPM;  Location: [facility] ORS;  Service: Podiatry;  Laterality: Right;   RIGHT HEART CATH N/A 08/14/2022   Procedure: RIGHT HEART CATH;  Surgeon: Runell Gess, MD;  Location: [facility] INVASIVE CV LAB;  Service: Cardiovascular;  Laterality: N/A;    Social History:  reports that she quit smoking about 26 years ago. Her smoking use included cigarettes. She has never used smokeless tobacco. She reports that she does not currently use drugs after having used the following drugs: Marijuana and Cocaine. She reports that she does not drink alcohol.   Allergies  Allergen Reactions   Morphine And Codeine Itching and Swelling   Zestril [Lisinopril] Nausea Only and Swelling    Family History  Problem Relation Age of Onset   Diabetes Father    Healthy Mother     Family history reviewed and not pertinent ***   Prior to Admission medications   Medication Sig Start Date End Date Taking? Authorizing Provider  albuterol (VENTOLIN HFA) 108 (90 Base) MCG/ACT inhaler Inhale 2 puffs into the lungs every 4 (four) hours as needed for wheezing or shortness of breath. 11/30/19   Zannie Cove, MD  amLODipine (NORVASC) 10 MG tablet Take 1 tablet (10 mg total) by mouth daily. 08/28/22 09/27/22  Robbie Lis M, PA-C  apixaban (ELIQUIS) 5 MG TABS tablet Take 1 tablet (5 mg total) by mouth 2 (two) times daily. 08/28/22   Allayne Butcher, PA-C  aspirin 81 MG chewable tablet Chew 81 mg by mouth daily.    [provider]  atorvastatin (LIPITOR) 40 MG tablet Take 1 tablet (40 mg total) by mouth daily. 08/28/22 09/27/22  Robbie Lis M, PA-C  Blood Glucose Monitoring Suppl (ONETOUCH VERIO FLEX SYSTEM) w/Device KIT Use in the morning, at noon, and  at bedtime. 05/23/22   Arrien, York Ram, MD  carvedilol (COREG) 25 MG tablet Take 1 tablet (25 mg total) by mouth 2 (two) times daily with a meal. 08/28/22 09/27/22  Robbie Lis M, PA-C  cyclobenzaprine (FLEXERIL) 10 MG tablet Take 10 mg by mouth 3 (three) times daily as needed for muscle spasms.    [provider]  dicyclomine (BENTYL) 10 MG capsule Take 1 capsule (10 mg total) by mouth 4 (four) times daily -  before meals and at bedtime. 08/20/21   Elson Areas, PA-C  diphenhydrAMINE (BENADRYL) 25 MG tablet Take 25 mg by mouth every 6 (six) hours as needed for allergies.    [provider]  famotidine (PEPCID) 20 MG tablet Take 20 mg by mouth at bedtime.  04/11/21   [provider]  ferrous sulfate 325 (65 FE) MG tablet Take 325 mg by mouth daily with breakfast.    [provider]  furosemide (LASIX) 20 MG tablet TAKE 3 TABLETS(60 MG) BY MOUTH TWICE DAILY 11/22/22   Bensimhon, Bevelyn Buckles, MD  glucose blood (ONETOUCH VERIO) test strip Use as instructed to check blood sugar 3 times daily. Dx codes: E11.8, E11.65 December 08, 2018   Hoy Register, MD  hydrALAZINE (APRESOLINE) 100 MG tablet Take 1 tablet (100 mg total) by mouth every 8 (eight) hours. 08/28/22   Robbie Lis M, PA-C  Insulin Pen Needle (PEN NEEDLES 3/16") 31G X 5 MM MISC 1 Device by Does not apply route 4 (four) times daily - after meals and at bedtime. 11/28/18   Grayce Sessions, NP  INSULIN SYRINGE .5CC/28G (INS SYRINGE/NEEDLE .5CC/28G) 28G X 1/2" 0.5 ML MISC Please provide 1 month supply 09/04/17   Scherrie Gerlach, MD  isosorbide mononitrate (IMDUR) 30 MG 24 hr tablet Take 3 tablets (90 mg total) by mouth daily. 08/28/22 09/27/22  Allayne Butcher, PA-C  linaclotide (LINZESS) 145 MCG CAPS capsule Take 145 mcg by mouth daily as needed (for constipation).    [provider]  meclizine (ANTIVERT) 25 MG tablet Take 1 tablet (25 mg total) by mouth 3 (three) times daily as needed for  dizziness. 04/26/21   Glade Lloyd, MD  naproxen (NAPROSYN) 375 MG tablet Take 1 tablet (375 mg total) by mouth 2 (two) times daily. 12/28/22   Kommor, Madison, MD  omeprazole (PRILOSEC) 40 MG capsule Take 40 mg by mouth daily before breakfast. 03/10/21   [provider]  OneTouch Delica Lancets 33G MISC Use to check blood sugar 3 times daily. Dx codes: E11.8, E11.65 12-08-2018   Hoy Register, MD  oxyCODONE-acetaminophen (PERCOCET) 10-325 MG tablet Take 1 tablet by mouth 3 (three) times daily.    [provider]  promethazine-dextromethorphan (PROMETHAZINE-DM) 6.25-15 MG/5ML syrup Take 2.5 mLs by mouth 4 (four) times daily as needed for cough. 12/28/22   Kommor, Madison, MD  senna (SENOKOT) 8.6 MG TABS tablet Take 1 tablet (8.6 mg total) by mouth 2 (two) times daily. 04/26/21   Glade Lloyd, MD     Objective    Physical Exam: Vitals:   04/15/23 2115 04/15/23 2145 04/15/23 2245 04/15/23 2300  BP: (!) 187/106 (!) 185/74 (!) 181/75 (!) 165/84  Pulse: (!) 110 (!) 104 (!) 107 (!) 109  Resp: (!) 23 19 (!) 21 (!) 22  Temp:      TempSrc:      SpO2: 100% 100% 99% 100%  Weight:    89.4 kg  Height:    5\' 7"  (1.702 m)    General: appears to be stated age; alert, oriented Skin: warm, dry, no rash Head:  AT/[place] Mouth:  Oral mucosa membranes appear moist, normal dentition Neck: supple; trachea midline Heart:  RRR; did not appreciate any M/R/G Lungs: CTAB, did not appreciate any wheezes, rales, or rhonchi Abdomen: + BS; soft, ND, NT Vascular: 2+ pedal pulses b/l; 2+ radial pulses b/l Extremities: no peripheral edema, no muscle wasting Neuro: strength and sensation intact in upper and lower extremities b/l ***   *** Neuro: 5/5 strength of the proximal and distal flexors and extensors of the upper and lower extremities bilaterally; sensation intact in upper and lower extremities b/l; cranial nerves II through XII grossly intact; no pronator drift; no evidence suggestive of slurred  speech, dysarthria, or facial droop; Normal muscle tone. No tremors.  ***  Neuro: In the setting of the patient's current mental status and associated inability to follow instructions, unable to perform full neurologic exam at this time.  As such, assessment of strength, sensation, and cranial nerves is limited at this time. Patient noted to spontaneously move all 4 extremities. No tremors.  ***    Labs on Admission: I have personally reviewed following labs and imaging studies  CBC: Recent Labs  Lab 04/15/23 2122  WBC 17.5*  NEUTROABS 14.5*  HGB 9.4*  HCT 28.1*  MCV 79.8*  PLT 390   Basic Metabolic Panel: Recent Labs  Lab 04/15/23 2122  NA 140  K 4.5  CL 112*  CO2 17*  GLUCOSE 105*  BUN 42*  CREATININE 4.51*  CALCIUM 8.6*   GFR: Estimated Creatinine Clearance: 17.3 mL/min (A) (by C-G formula based on SCr of 4.51 mg/dL (H)). Liver Function Tests: Recent Labs  Lab 04/15/23 2122  AST 38  ALT 14  ALKPHOS 64  BILITOT 1.2*  PROT 7.0  ALBUMIN 2.5*   No results for input(s): "LIPASE", "AMYLASE" in the last 168 hours. No results for input(s): "AMMONIA" in the last 168 hours. Coagulation Profile: No results for input(s): "INR", "PROTIME" in the last 168 hours. Cardiac Enzymes: No results for input(s): "CKTOTAL", "CKMB", "CKMBINDEX", "TROPONINI" in the last 168 hours. BNP (last 3 results) No results for input(s): "PROBNP" in the last 8760 hours. HbA1C: No results for input(s): "HGBA1C" in the last 72 hours. CBG: No results for input(s): "GLUCAP" in the last 168 hours. Lipid Profile: No results for input(s): "CHOL", "HDL", "LDLCALC", "TRIG", "CHOLHDL", "LDLDIRECT" in the last 72 hours. Thyroid Function Tests: No results for input(s): "TSH", "T4TOTAL", "FREET4", "T3FREE", "THYROIDAB" in the last 72 hours. Anemia Panel: No results for input(s): "VITAMINB12", "FOLATE", "FERRITIN", "TIBC", "IRON", "RETICCTPCT" in the last 72 hours. Urine analysis:    Component Value  Date/Time   COLORURINE STRAW (A) 08/11/2022 1533   APPEARANCEUR CLEAR 08/11/2022 1533   LABSPEC 1.007 08/11/2022 1533   PHURINE 5.0 08/11/2022 1533   GLUCOSEU >=500 (A) 08/11/2022 1533   GLUCOSEU > 1000 mg/dL (A) 16/01/9603 5409   HGBUR NEGATIVE 08/11/2022 1533   HGBUR negative 08/12/2008 1444   BILIRUBINUR NEGATIVE 08/11/2022 1533   BILIRUBINUR neg 02/07/2017 1609   KETONESUR NEGATIVE 08/11/2022 1533   PROTEINUR 100 (A) 08/11/2022 1533   UROBILINOGEN 1.0 02/07/2017 1609   UROBILINOGEN 0.2 11/11/2014 1658   NITRITE NEGATIVE 08/11/2022 1533   LEUKOCYTESUR NEGATIVE 08/11/2022 1533    Radiological Exams on Admission: DG Chest 2 View Result Date: 04/15/2023 CLINICAL DATA:  Shortness of breath EXAM: CHEST - 2 VIEW COMPARISON:  None Available. FINDINGS: Heart is upper limits normal in size. Mediastinal contours within normal limits. No confluent airspace opacities, effusions or edema. No acute bony abnormality. IMPRESSION: No active cardiopulmonary disease. Electronically Signed   By: Charlett Nose M.D.   On: 04/15/2023 20:49      Assessment/Plan   Active Problems:   Atypical chest pain   ***            ***                ***                 ***               ***               ***               ***                ***               ***               ***               ***               ***              ***          ***  DVT prophylaxis: SCD's ***  Code Status: Full code*** Family Communication: none*** Disposition Plan: Per Rounding Team Consults called: none***;  Admission status: ***     I SPENT GREATER THAN 75 *** MINUTES IN CLINICAL CARE TIME/MEDICAL DECISION-MAKING IN COMPLETING THIS ADMISSION.      Chaney Born Glorianna Gott DO [facility]  From 7PM - 7AM   04/15/2023, 11:21 PM    ***

## 2023-04-15 NOTE — ED Provider Notes (Signed)
[place] EMERGENCY DEPARTMENT AT [facility] Provider Note   CSN: [id number] Arrival date & time: 04/15/23  2022     History  Chief Complaint  Patient presents with   Chest Pain   Shortness of Breath    Shirley [name] is a 47 y.o. female.  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  Patient states that she was seen at [facility] after symptoms started a week ago.  She states that she tested negative for COVID.  She was given azithromycin.  She reports worsening shortness of breath this evening.  She has completed the antibiotic without improvement.  She complains pain in her right chest and right back.  Cough productive of phlegm, no blood.  She denies fevers.  She has lower extremity edema that is mild, not worse than baseline.  She denies any unilateral leg pain or swelling.       Home Medications Prior to Admission medications   Medication Sig Start Date End Date Taking? Authorizing Provider  albuterol (VENTOLIN HFA) 108 (90 Base) MCG/ACT inhaler Inhale 2 puffs into the lungs every 4 (four) hours as needed for wheezing or shortness of breath. 11/30/19   Zannie Cove, MD  amLODipine (NORVASC) 10 MG tablet Take 1 tablet (10 mg total) by mouth daily. 08/28/22 09/27/22  Robbie Lis M, PA-C  apixaban (ELIQUIS) 5 MG TABS tablet Take 1 tablet (5 mg total) by mouth 2 (two) times daily. 08/28/22   Allayne Butcher, PA-C  aspirin 81 MG chewable tablet Chew 81 mg by mouth daily.    [provider]  atorvastatin (LIPITOR) 40 MG tablet Take 1 tablet (40 mg total) by mouth daily. 08/28/22 09/27/22  Robbie Lis M, PA-C  Blood Glucose Monitoring Suppl (ONETOUCH VERIO FLEX SYSTEM) w/Device KIT Use in the morning, at noon,  and at bedtime. 05/23/22   Arrien, York Ram, MD  carvedilol (COREG) 25 MG tablet Take 1 tablet (25 mg total) by mouth 2 (two) times daily with a meal. 08/28/22 09/27/22  Robbie Lis M, PA-C  cyclobenzaprine (FLEXERIL) 10 MG tablet Take 10 mg by mouth 3 (three) times daily as needed for muscle spasms.    [provider]  dicyclomine (BENTYL) 10 MG capsule Take 1 capsule (10 mg total) by mouth 4 (four) times daily -  before meals and at bedtime. 08/20/21   Elson Areas, PA-C  diphenhydrAMINE (BENADRYL) 25 MG tablet Take 25 mg by mouth every 6 (six) hours as needed for allergies.    [provider]  famotidine (PEPCID) 20 MG tablet Take 20 mg by mouth at bedtime. 04/11/21   [provider]  ferrous sulfate 325 (65 FE) MG tablet Take 325 mg by mouth daily with breakfast.    [provider]  furosemide (LASIX) 20 MG tablet TAKE 3 TABLETS(60 MG) BY MOUTH TWICE DAILY 11/22/22   Bensimhon, Bevelyn Buckles, MD  glucose blood (ONETOUCH VERIO) test strip Use as instructed to check blood sugar 3 times daily. Dx codes: E11.8, E11.65 December 13, 2018   Hoy Register, MD  hydrALAZINE (APRESOLINE) 100 MG tablet Take 1 tablet (100 mg total) by mouth every 8 (eight) hours. 08/28/22   Robbie Lis M, PA-C  Insulin Pen Needle (PEN NEEDLES 3/16") 31G X 5  MM MISC 1 Device by Does not apply route 4 (four) times daily - after meals and at bedtime. 11/28/18   Grayce Sessions, NP  INSULIN SYRINGE .5CC/28G (INS SYRINGE/NEEDLE .5CC/28G) 28G X 1/2" 0.5 ML MISC Please provide 1 month supply 09/04/17   Scherrie Gerlach, MD  isosorbide mononitrate (IMDUR) 30 MG 24 hr tablet Take 3 tablets (90 mg total) by mouth daily. 08/28/22 09/27/22  Allayne Butcher, PA-C  linaclotide (LINZESS) 145 MCG CAPS capsule Take 145 mcg by mouth daily as needed (for constipation).    [provider]  meclizine (ANTIVERT) 25 MG tablet Take 1 tablet (25 mg total) by mouth 3 (three) times daily as needed for  dizziness. 04/26/21   Glade Lloyd, MD  naproxen (NAPROSYN) 375 MG tablet Take 1 tablet (375 mg total) by mouth 2 (two) times daily. 12/28/22   Kommor, Madison, MD  omeprazole (PRILOSEC) 40 MG capsule Take 40 mg by mouth daily before breakfast. 03/10/21   [provider]  OneTouch Delica Lancets 33G MISC Use to check blood sugar 3 times daily. Dx codes: E11.8, E11.65 2018/12/11   Hoy Register, MD  oxyCODONE-acetaminophen (PERCOCET) 10-325 MG tablet Take 1 tablet by mouth 3 (three) times daily.    [provider]  promethazine-dextromethorphan (PROMETHAZINE-DM) 6.25-15 MG/5ML syrup Take 2.5 mLs by mouth 4 (four) times daily as needed for cough. 12/28/22   Kommor, Madison, MD  senna (SENOKOT) 8.6 MG TABS tablet Take 1 tablet (8.6 mg total) by mouth 2 (two) times daily. 04/26/21   Glade Lloyd, MD      Allergies    Morphine and codeine and Zestril [lisinopril]    Review of Systems   Review of Systems  Physical Exam Updated Vital Signs BP (!) 174/152 (BP Location: Right Arm)   Pulse (!) 110   Temp 98.3 F (36.8 C) (Oral)   Resp (!) 24   LMP 01/22/2021 Comment: neg preg test 04/17/21  SpO2 100%   Physical Exam Vitals and nursing note reviewed.  Constitutional:      General: She is not in acute distress.    Appearance: She is well-developed.  HENT:     Head: Normocephalic and atraumatic.     Right Ear: External ear normal.     Left Ear: External ear normal.     Nose: Nose normal.  Eyes:     Conjunctiva/sclera: Conjunctivae normal.  Cardiovascular:     Rate and Rhythm: Regular rhythm. Tachycardia present.     Heart sounds: No murmur heard. Pulmonary:     Effort: Tachypnea present. No respiratory distress.     Breath sounds: No wheezing, rhonchi or rales.  Abdominal:     Palpations: Abdomen is soft.     Tenderness: There is no abdominal tenderness. There is no guarding or rebound.  Musculoskeletal:     Cervical back: Normal range of motion and neck supple.      Right lower leg: Edema present.     Left lower leg: Edema present.     Comments: 1-2+ pitting edema bilateral lower extremities, symmetric.  Skin:    General: Skin is warm and dry.     Findings: No rash.  Neurological:     General: No focal deficit present.     Mental Status: She is alert. Mental status is at baseline.     Motor: No weakness.  Psychiatric:        Mood and Affect: Mood normal.     ED Results / Procedures / Treatments  Labs (all labs ordered are listed, but only abnormal results are displayed) Labs Reviewed  RESP PANEL BY RT-PCR (RSV, FLU A&B, COVID)  RVPGX2 - Abnormal; Notable for the following components:      Result Value   Influenza A by PCR POSITIVE (*)    All other components within normal limits  CBC WITH DIFFERENTIAL/PLATELET - Abnormal; Notable for the following components:   WBC 17.5 (*)    RBC 3.52 (*)    Hemoglobin 9.4 (*)    HCT 28.1 (*)    MCV 79.8 (*)    RDW 16.2 (*)    Neutro Abs 14.5 (*)    Abs Immature Granulocytes 0.10 (*)    All other components within normal limits  COMPREHENSIVE METABOLIC PANEL - Abnormal; Notable for the following components:   Chloride 112 (*)    CO2 17 (*)    Glucose, Bld 105 (*)    BUN 42 (*)    Creatinine, Ser 4.51 (*)    Calcium 8.6 (*)    Albumin 2.5 (*)    Total Bilirubin 1.2 (*)    GFR, Estimated 11 (*)    All other components within normal limits  BRAIN NATRIURETIC PEPTIDE - Abnormal; Notable for the following components:   B Natriuretic Peptide 1,788.7 (*)    All other components within normal limits  D-DIMER, QUANTITATIVE - Abnormal; Notable for the following components:   D-Dimer, Quant 1.95 (*)    All other components within normal limits  TROPONIN I (HIGH SENSITIVITY) - Abnormal; Notable for the following components:   Troponin I (High Sensitivity) 61 (*)    All other components within normal limits  TROPONIN I (HIGH SENSITIVITY)    ED ECG REPORT   Date: 04/15/2023  Rate: 107  Rhythm:  sinus tachycardia, PVC  QRS Axis: normal  Intervals: normal  ST/T Wave abnormalities: nonspecific T wave changes  Conduction Disutrbances:none  Narrative Interpretation:   Old EKG Reviewed: unchanged  I have personally reviewed the EKG tracing and agree with the computerized printout as noted.   Radiology DG Chest 2 View Result Date: 04/15/2023 CLINICAL DATA:  Shortness of breath EXAM: CHEST - 2 VIEW COMPARISON:  None Available. FINDINGS: Heart is upper limits normal in size. Mediastinal contours within normal limits. No confluent airspace opacities, effusions or edema. No acute bony abnormality. IMPRESSION: No active cardiopulmonary disease. Electronically Signed   By: Charlett Nose M.D.   On: 04/15/2023 20:49    Procedures Procedures    Medications Ordered in ED Medications  albuterol (VENTOLIN HFA) 108 (90 Base) MCG/ACT inhaler 2 puff (has no administration in time range)    ED Course/ Medical Decision Making/ A&P    Patient seen and examined. History obtained directly from patient.  Reviewed most recent discharge summary.  Labs/EKG: Ordered CBC, CMP, BNP, troponin.  EKG personally reviewed and interpreted as above.  Imaging: Personally reviewed and interpreted chest x-ray, agree negative.  Medications/Fluids: None ordered  Most recent vital signs reviewed and are as follows: BP (!) 174/152 (BP Location: Right Arm)   Pulse (!) 110   Temp 98.3 F (36.8 C) (Oral)   Resp (!) 24   LMP 01/22/2021 Comment: neg preg test 04/17/21  SpO2 100%   Initial impression: Shortness of breath and chest pain in patient with history of PE, history of chronic kidney disease.  Medically complex with multiple significant comorbidities.  11:07 PM Reassessment performed. Patient appears stable.  Seen with Dr. Eloise Harman at bedside.  Labs personally reviewed and  interpreted including: CBC with differential showing elevated white blood cell count at 17.5, hemoglobin of 9.4; CMP with acute on  chronic kidney disease creatinine 4.5 with a BUN of 42; bicarb 17; troponin elevated at 61; BNP 1788; D-dimer elevated at 1.95.  Imaging personally visualized and interpreted including: Chest x-ray, agree no active disease  Reviewed pertinent lab work and imaging with patient at bedside. Questions answered.   Most current vital signs reviewed and are as follows: BP (!) 165/84   Pulse (!) 109   Temp 98.3 F (36.8 C) (Oral)   Resp (!) 22   Ht 5\' 7"  (1.702 m)   Wt 89.4 kg Comment: Wt from 08/2022  LMP 01/22/2021 Comment: neg preg test 04/17/21  SpO2 100%   BMI 30.87 kg/m   Plan: Given chest and back pain in setting of influenza without evidence of pneumonia on chest x-ray, patient with history of PE/DVT will start on heparin drip.  Patient will need VQ scan given her kidney function.  Troponin elevated, likely related to chronic kidney disease and stress due to current illness, but will need trended.  Will discuss with hospitalist for admission.  11:14 PM Discussed with Dr. Arlean Hopping of [facility] who will admit.                                  Medical Decision Making Amount and/or Complexity of Data Reviewed Labs: ordered. Radiology: ordered.  Risk Prescription drug management.   Patient with high risk chest pain in setting of acute on chronic kidney disease, influenza A.  Will need to rule out pulmonary embolism given chest pain and shortness of breath, tachycardia.  She will need VQ scan based on chronic kidney disease.  Troponin elevated, likely secondary to demand and chronic kidney disease, however would like to admit to trend this as well.        Final Clinical Impression(s) / ED Diagnoses Final diagnoses:  Precordial pain  Influenza A  Acute kidney injury superimposed on chronic kidney disease (HCC)  History of pulmonary embolism    Rx / DC Orders ED Discharge Orders     None         Renne Crigler, PA-C 04/15/23 2316    Rondel Baton,  MD 04/17/23 1424

## 2023-04-16 ENCOUNTER — Inpatient Hospital Stay (HOSPITAL_COMMUNITY): Payer: 59

## 2023-04-16 ENCOUNTER — Encounter (HOSPITAL_COMMUNITY): Payer: Self-pay | Admitting: Internal Medicine

## 2023-04-16 DIAGNOSIS — R0789 Other chest pain: Secondary | ICD-10-CM | POA: Diagnosis not present

## 2023-04-16 DIAGNOSIS — R7989 Other specified abnormal findings of blood chemistry: Secondary | ICD-10-CM | POA: Diagnosis present

## 2023-04-16 DIAGNOSIS — J101 Influenza due to other identified influenza virus with other respiratory manifestations: Secondary | ICD-10-CM | POA: Diagnosis present

## 2023-04-16 DIAGNOSIS — J111 Influenza due to unidentified influenza virus with other respiratory manifestations: Secondary | ICD-10-CM | POA: Diagnosis present

## 2023-04-16 DIAGNOSIS — G9340 Encephalopathy, unspecified: Secondary | ICD-10-CM | POA: Diagnosis not present

## 2023-04-16 DIAGNOSIS — N184 Chronic kidney disease, stage 4 (severe): Secondary | ICD-10-CM | POA: Diagnosis not present

## 2023-04-16 LAB — URINALYSIS, COMPLETE (UACMP) WITH MICROSCOPIC
Bilirubin Urine: NEGATIVE
Glucose, UA: 150 mg/dL — AB
Ketones, ur: NEGATIVE mg/dL
Leukocytes,Ua: NEGATIVE
Nitrite: NEGATIVE
Protein, ur: >=300 mg/dL — AB
Specific Gravity, Urine: 1.014 (ref 1.005–1.030)
pH: 6 (ref 5.0–8.0)

## 2023-04-16 LAB — BLOOD GAS, ARTERIAL
Acid-base deficit: 10.1 mmol/L — ABNORMAL HIGH (ref 0.0–2.0)
Bicarbonate: 15.1 mmol/L — ABNORMAL LOW (ref 20.0–28.0)
O2 Saturation: 98.1 %
Patient temperature: 37.6
pCO2 arterial: 31 mm[Hg] — ABNORMAL LOW (ref 32–48)
pH, Arterial: 7.3 — ABNORMAL LOW (ref 7.35–7.45)
pO2, Arterial: 83 mm[Hg] (ref 83–108)

## 2023-04-16 LAB — COMPREHENSIVE METABOLIC PANEL
ALT: 15 U/L (ref 0–44)
ALT: 11 U/L (ref 0–44)
AST: 35 U/L (ref 15–41)
AST: 26 U/L (ref 15–41)
Albumin: 2.6 g/dL — ABNORMAL LOW (ref 3.5–5.0)
Albumin: 2.1 g/dL — ABNORMAL LOW (ref 3.5–5.0)
Alkaline Phosphatase: 66 U/L (ref 38–126)
Alkaline Phosphatase: 53 U/L (ref 38–126)
Anion gap: 10 (ref 5–15)
Anion gap: 11 (ref 5–15)
BUN: 44 mg/dL — ABNORMAL HIGH (ref 6–20)
BUN: 46 mg/dL — ABNORMAL HIGH (ref 6–20)
CO2: 17 mmol/L — ABNORMAL LOW (ref 22–32)
CO2: 14 mmol/L — ABNORMAL LOW (ref 22–32)
Calcium: 8.6 mg/dL — ABNORMAL LOW (ref 8.9–10.3)
Calcium: 8.3 mg/dL — ABNORMAL LOW (ref 8.9–10.3)
Chloride: 111 mmol/L (ref 98–111)
Chloride: 113 mmol/L — ABNORMAL HIGH (ref 98–111)
Creatinine, Ser: 4.73 mg/dL — ABNORMAL HIGH (ref 0.44–1.00)
Creatinine, Ser: 5.26 mg/dL — ABNORMAL HIGH (ref 0.44–1.00)
GFR, Estimated: 11 mL/min — ABNORMAL LOW (ref >60)
GFR, Estimated: 9 mL/min — ABNORMAL LOW (ref >60)
Glucose, Bld: 155 mg/dL — ABNORMAL HIGH (ref 70–99)
Glucose, Bld: 120 mg/dL — ABNORMAL HIGH (ref 70–99)
Potassium: 4.6 mmol/L (ref 3.5–5.1)
Potassium: 4.2 mmol/L (ref 3.5–5.1)
Sodium: 138 mmol/L (ref 135–145)
Sodium: 138 mmol/L (ref 135–145)
Total Bilirubin: 0.5 mg/dL (ref <1.2)
Total Bilirubin: 0.6 mg/dL (ref <1.2)
Total Protein: 7.5 g/dL (ref 6.5–8.1)
Total Protein: 6 g/dL — ABNORMAL LOW (ref 6.5–8.1)

## 2023-04-16 LAB — CBC WITH DIFFERENTIAL/PLATELET
Abs Immature Granulocytes: 0.1 10*3/uL — ABNORMAL HIGH (ref 0.00–0.07)
Basophils Absolute: 0 10*3/uL (ref 0.0–0.1)
Basophils Relative: 0 %
Eosinophils Absolute: 0 10*3/uL (ref 0.0–0.5)
Eosinophils Relative: 0 %
HCT: 31 % — ABNORMAL LOW (ref 36.0–46.0)
Hemoglobin: 10.5 g/dL — ABNORMAL LOW (ref 12.0–15.0)
Immature Granulocytes: 1 %
Lymphocytes Relative: 4 %
Lymphs Abs: 0.6 10*3/uL — ABNORMAL LOW (ref 0.7–4.0)
MCH: 27.1 pg (ref 26.0–34.0)
MCHC: 33.9 g/dL (ref 30.0–36.0)
MCV: 80.1 fL (ref 80.0–100.0)
Monocytes Absolute: 0.8 10*3/uL (ref 0.1–1.0)
Monocytes Relative: 5 %
Neutro Abs: 14.1 10*3/uL — ABNORMAL HIGH (ref 1.7–7.7)
Neutrophils Relative %: 90 %
Platelets: 411 10*3/uL — ABNORMAL HIGH (ref 150–400)
RBC: 3.87 MIL/uL (ref 3.87–5.11)
RDW: 16.2 % — ABNORMAL HIGH (ref 11.5–15.5)
WBC: 15.7 10*3/uL — ABNORMAL HIGH (ref 4.0–10.5)
nRBC: 0 % (ref 0.0–0.2)

## 2023-04-16 LAB — LACTIC ACID, PLASMA
Lactic Acid, Venous: 1.3 mmol/L (ref 0.5–1.9)
Lactic Acid, Venous: 1.2 mmol/L (ref 0.5–1.9)

## 2023-04-16 LAB — SODIUM, URINE, RANDOM: Sodium, Ur: 63 mmol/L

## 2023-04-16 LAB — PHOSPHORUS: Phosphorus: 4.8 mg/dL — ABNORMAL HIGH (ref 2.5–4.6)

## 2023-04-16 LAB — CK: Total CK: 1630 U/L — ABNORMAL HIGH (ref 38–234)

## 2023-04-16 LAB — GLUCOSE, CAPILLARY
Glucose-Capillary: 100 mg/dL — ABNORMAL HIGH (ref 70–99)
Glucose-Capillary: 123 mg/dL — ABNORMAL HIGH (ref 70–99)
Glucose-Capillary: 151 mg/dL — ABNORMAL HIGH (ref 70–99)

## 2023-04-16 LAB — MAGNESIUM
Magnesium: 1.9 mg/dL (ref 1.7–2.4)
Magnesium: 1.9 mg/dL (ref 1.7–2.4)

## 2023-04-16 LAB — I-STAT CG4 LACTIC ACID, ED
Lactic Acid, Venous: 0.6 mmol/L (ref 0.5–1.9)
Lactic Acid, Venous: 0.5 mmol/L (ref 0.5–1.9)

## 2023-04-16 LAB — HEPARIN LEVEL (UNFRACTIONATED): Heparin Unfractionated: 0.19 [IU]/mL — ABNORMAL LOW (ref 0.30–0.70)

## 2023-04-16 LAB — PROCALCITONIN
Procalcitonin: 0.12 ng/mL
Procalcitonin: 0.33 ng/mL

## 2023-04-16 LAB — CREATININE, URINE, RANDOM: Creatinine, Urine: 91 mg/dL

## 2023-04-16 LAB — MRSA NEXT GEN BY PCR, NASAL: MRSA by PCR Next Gen: NOT DETECTED

## 2023-04-16 LAB — LIPASE, BLOOD: Lipase: 24 U/L (ref 11–51)

## 2023-04-16 LAB — HEMOGLOBIN A1C
Hgb A1c MFr Bld: 6.5 % — ABNORMAL HIGH (ref 4.8–5.6)
Mean Plasma Glucose: 140 mg/dL

## 2023-04-16 LAB — CBG MONITORING, ED
Glucose-Capillary: 164 mg/dL — ABNORMAL HIGH (ref 70–99)
Glucose-Capillary: 90 mg/dL (ref 70–99)

## 2023-04-16 LAB — TROPONIN I (HIGH SENSITIVITY): Troponin I (High Sensitivity): 59 ng/L — ABNORMAL HIGH (ref <18)

## 2023-04-16 MED ORDER — BENZONATATE 100 MG PO CAPS
200.0000 mg | ORAL_CAPSULE | Freq: Three times a day (TID) | ORAL | Status: DC | PRN
  Administered 2023-04-16: 200 mg via ORAL
  Filled 2023-04-16: qty 2

## 2023-04-16 MED ORDER — PANTOPRAZOLE SODIUM 40 MG PO TBEC
40.0000 mg | DELAYED_RELEASE_TABLET | Freq: Every day | ORAL | Status: DC
  Administered 2023-04-16: 40 mg via ORAL
  Filled 2023-04-16: qty 1

## 2023-04-16 MED ORDER — OXYCODONE-ACETAMINOPHEN 5-325 MG PO TABS
2.0000 | ORAL_TABLET | ORAL | Status: DC | PRN
  Administered 2023-04-16: 2 via ORAL
  Filled 2023-04-16: qty 2

## 2023-04-16 MED ORDER — HYDRALAZINE HCL 50 MG PO TABS
100.0000 mg | ORAL_TABLET | Freq: Three times a day (TID) | ORAL | Status: DC
  Administered 2023-04-16 (×3): 100 mg via ORAL
  Filled 2023-04-16 (×3): qty 2

## 2023-04-16 MED ORDER — LACTATED RINGERS IV BOLUS
1000.0000 mL | Freq: Once | INTRAVENOUS | Status: AC
Start: 2023-04-16 — End: 2023-04-16
  Administered 2023-04-16: 1000 mL via INTRAVENOUS

## 2023-04-16 MED ORDER — INSULIN ASPART 100 UNIT/ML IJ SOLN
0.0000 [IU] | INTRAMUSCULAR | Status: DC
  Administered 2023-04-16 – 2023-04-23 (×11): 1 [IU] via SUBCUTANEOUS
  Administered 2023-04-24 (×3): 3 [IU] via SUBCUTANEOUS
  Administered 2023-04-24 – 2023-04-25 (×4): 1 [IU] via SUBCUTANEOUS
  Administered 2023-04-25: 3 [IU] via SUBCUTANEOUS
  Administered 2023-04-25 – 2023-04-26 (×3): 1 [IU] via SUBCUTANEOUS
  Administered 2023-04-26: 2 [IU] via SUBCUTANEOUS
  Administered 2023-04-26: 1 [IU] via SUBCUTANEOUS

## 2023-04-16 MED ORDER — ORAL CARE MOUTH RINSE: 15.0000 mL | OROMUCOSAL | Status: DC | PRN

## 2023-04-16 MED ORDER — SODIUM CHLORIDE 0.9 % IV SOLN
250.0000 mL | INTRAVENOUS | Status: DC
Start: 2023-04-16 — End: 2023-04-17

## 2023-04-16 MED ORDER — OXYCODONE-ACETAMINOPHEN 5-325 MG PO TABS: 2.0000 | ORAL_TABLET | Freq: Four times a day (QID) | ORAL | Status: DC | PRN

## 2023-04-16 MED ORDER — ALBUTEROL SULFATE (2.5 MG/3ML) 0.083% IN NEBU: 2.5000 mg | INHALATION_SOLUTION | RESPIRATORY_TRACT | Status: DC | PRN

## 2023-04-16 MED ORDER — GUAIFENESIN ER 600 MG PO TB12
600.0000 mg | ORAL_TABLET | Freq: Two times a day (BID) | ORAL | Status: DC
  Administered 2023-04-16: 600 mg via ORAL
  Filled 2023-04-16: qty 1

## 2023-04-16 MED ORDER — INSULIN ASPART 100 UNIT/ML IJ SOLN
0.0000 [IU] | Freq: Three times a day (TID) | INTRAMUSCULAR | Status: DC
  Administered 2023-04-16: 1 [IU] via SUBCUTANEOUS

## 2023-04-16 MED ORDER — TECHNETIUM TO 99M ALBUMIN AGGREGATED
4.4000 | Freq: Once | INTRAVENOUS | Status: AC | PRN
  Administered 2023-04-16: 4.4 via INTRAVENOUS

## 2023-04-16 MED ORDER — SODIUM CHLORIDE 0.9 % IV SOLN: INTRAVENOUS | Status: DC

## 2023-04-16 MED ORDER — ATORVASTATIN CALCIUM 40 MG PO TABS
40.0000 mg | ORAL_TABLET | Freq: Every day | ORAL | Status: DC
  Administered 2023-04-16: 40 mg via ORAL
  Filled 2023-04-16: qty 1

## 2023-04-16 MED ORDER — PANTOPRAZOLE SODIUM 40 MG IV SOLR
40.0000 mg | INTRAVENOUS | Status: DC
  Administered 2023-04-17 – 2023-04-24 (×8): 40 mg via INTRAVENOUS
  Filled 2023-04-16 (×8): qty 10

## 2023-04-16 MED ORDER — AMLODIPINE BESYLATE 10 MG PO TABS
10.0000 mg | ORAL_TABLET | Freq: Every day | ORAL | Status: DC
  Administered 2023-04-16: 10 mg via ORAL
  Filled 2023-04-16: qty 2

## 2023-04-16 MED ORDER — NOREPINEPHRINE 4 MG/250ML-% IV SOLN
2.0000 ug/min | INTRAVENOUS | Status: DC
  Filled 2023-04-16: qty 250

## 2023-04-16 MED ORDER — FERROUS SULFATE 325 (65 FE) MG PO TABS
325.0000 mg | ORAL_TABLET | Freq: Every day | ORAL | Status: DC
Start: 2023-04-16 — End: 2023-04-16
  Administered 2023-04-16: 325 mg via ORAL
  Filled 2023-04-16 (×2): qty 1

## 2023-04-16 MED ORDER — ISOSORBIDE MONONITRATE ER 30 MG PO TB24: 90.0000 mg | ORAL_TABLET | Freq: Every day | ORAL | Status: DC

## 2023-04-16 MED ORDER — FAMOTIDINE 20 MG PO TABS
20.0000 mg | ORAL_TABLET | Freq: Every day | ORAL | Status: DC
Start: 2023-04-16 — End: 2023-04-16

## 2023-04-16 MED ORDER — CHLORHEXIDINE GLUCONATE CLOTH 2 % EX PADS
6.0000 | MEDICATED_PAD | Freq: Every day | CUTANEOUS | Status: DC
  Administered 2023-04-16 – 2023-04-26 (×7): 6 via TOPICAL

## 2023-04-16 MED ORDER — OSELTAMIVIR PHOSPHATE 30 MG PO CAPS
30.0000 mg | ORAL_CAPSULE | Freq: Every day | ORAL | Status: DC
  Administered 2023-04-16 – 2023-04-18 (×3): 30 mg via ORAL
  Filled 2023-04-16 (×3): qty 1

## 2023-04-16 MED ORDER — KETOROLAC TROMETHAMINE 15 MG/ML IJ SOLN: 15.0000 mg | Freq: Once | INTRAMUSCULAR | Status: DC

## 2023-04-16 MED ORDER — CARVEDILOL 25 MG PO TABS
25.0000 mg | ORAL_TABLET | Freq: Two times a day (BID) | ORAL | Status: DC
  Administered 2023-04-16 (×2): 25 mg via ORAL
  Filled 2023-04-16 (×2): qty 2

## 2023-04-16 MED ORDER — HYDROMORPHONE HCL 1 MG/ML IJ SOLN
1.0000 mg | INTRAMUSCULAR | Status: DC | PRN
  Administered 2023-04-16: 1 mg via INTRAVENOUS
  Filled 2023-04-16: qty 1

## 2023-04-16 MED ORDER — HYDROMORPHONE HCL 1 MG/ML IJ SOLN
0.5000 mg | INTRAMUSCULAR | Status: DC | PRN
  Administered 2023-04-16: 0.5 mg via INTRAVENOUS
  Filled 2023-04-16: qty 1

## 2023-04-16 MED ORDER — LORAZEPAM 2 MG/ML IJ SOLN: 0.5000 mg | Freq: Four times a day (QID) | INTRAMUSCULAR | Status: DC | PRN

## 2023-04-16 MED ORDER — PROCHLORPERAZINE EDISYLATE 10 MG/2ML IJ SOLN
10.0000 mg | INTRAMUSCULAR | Status: DC | PRN
  Administered 2023-04-16: 10 mg via INTRAVENOUS
  Filled 2023-04-16: qty 2

## 2023-04-16 NOTE — ED Notes (Signed)
Pt is on travel monitor. CCMD notified. Pt being transported for imaging.

## 2023-04-16 NOTE — Progress Notes (Signed)
Progress Note   Patient: Shirley [name]:[id number] DOB: 07/21/1975 DOA: 04/15/2023     1 DOS: the patient was seen and examined on 04/16/2023 at 12:50PM and 2:44PM      Brief hospital course: 47 y.o. 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  In the ER, she reported chest pain similar to her previous PEs.  She was started on heparin infusion and admitted for VQ.  Overnight, she became progressively more sleepy.  This morning, she was able to take oral meds and follow commands, but by early this afternoon, she became abruptly hypotensive and less responsive.  CCM consulted and transferred to ICU.     Assessment and Plan: Acute metabolic encephalopathy ABG shows pH 7.3, CO2 low, c/w metabolic acidosis.  Lactate normal.  Here, given only 15 mg oxycodone total in last 24 hours, low dose hydromorphone, no other sedating meds (At home, takes oxycodone 50 mg total daily).  No response here to half dose Narcan.  BUN not that high, doubt uremia.  No history liver disease, LFTs normal.  No seizures observed. - Obtain CT head - IV fluids - Transfer to ICU for blood pressure support   Hypotension Unclear cause.  This appears to have been relatively abrupt, after administration of her home BP meds. Does have influenza and was vomiting.   - IV fluids - Consult CCM for ICU care    Acute metabolic acidosis [place] acidosis in the setting of vomiting, worsening renal failure. - IV fluids  Influenza - Start Tamiflu  Acute on chronic renal failure Cr was 2.4 last July.  More recent scanned result from 1 week ago shows Cr up to 4.5 Was due to have fistula placed tomorrow.  Cr trending up here to 5.3 overnight - IV fluids  Chest pain History of recurrent  VTE Heaviness and pleuritic pain.  VQ scan ruled out PE.  CXR clear.  Troponins low and flat, not consistent with active ischemia.  On Eliquis in the past, not recently. - Stop heparin  Chronic systolic and diastolic congestive heart failure EF 45-50%  Morbid obesity Boyfriend thinks she is on Wegovy.  Not in home med list.  BMI 35.7 in setting of comorbid obesity, diabetes.  Diabetes Gastroparesis Glucoses normal here. Vomiting on admission. - Continue SS corrections  Coronary disease with chronic angina Cerebrovascular disease Hypertension - Hold amlodipine, Coreg, Lasix, hydralazine, Imdur - Hold aspirin, Lipitor  Sleep apnea Gas normal, no hypercarbia          Subjective: Was sleepy prior, now obtunded.  No reports of pain.  No respiratory distress.     Physical Exam: BP (S) (!) 86/53 Comment: [place]oke NP aware - order for US guided IV so we can start low dose pressors  Pulse 85   Temp (!) 97.5 F (36.4 C) (Axillary)   Resp 16   Ht 5\' 7"  (1.702 m)   Wt 103.4 kg   LMP 01/22/2021 Comment: neg preg test 04/17/21  SpO2 95%   BMI 35.70 kg/m   Obese adult female, lying in bed, somnolent, poorly responsive RRR, no murmur, no pitting edema Respiratory rate slow but even, no rales or wheezes appreciated Abdomen soft, no grimace to palpation Attention diminished, poorly responsive, pupils sluggish but equal.  Makes occasional grunt to stimuli.    Data Reviewed: Discussed with Critical Care ABG reviewed BMP shows worsening renal function, worsening acidosis CBC unremrkable   Family Communication: Boyfriend at bedside    Disposition: Status is: Inpatient  The patient is critically ill with multi-organ failure.  Critical care was necessary to treat or prevent imminent or life-threatening deterioration of sepsis, respiratory failure, and encephalopathy and was exclusive of separately billable procedures and treating other patients. Total critical care  time spent by me: 50 minutes Time spent personally by me on obtaining history from patient or surrogate, evaluation of the patient, evaluation of patient's response to treatment, ordering and review of laboratory studies, ordering and review of radiographic studies, ordering and performing treatments and interventions, and re-evaluation of the patient's condition.        Author: Alberteen Sam, MD 04/16/2023 5:43 PM  For on call review [email or web address].

## 2023-04-16 NOTE — Hospital Course (Addendum)
Shirley [name] is a 47 y.o. female with a history of CKD stage IV, hypertension, HFmrEF, CAD s/p PCI, stroke, OSA, recurrent VTE.  Patient presented secondary to flu-like symptoms, pleuritic chest pain and chest heaviness found to have influenza A infection. Patient started on Tamiflu for treatment. Hospitalization complicated by acute metabolic encephalopathy in addition to hypotension, requiring a one day admission to ICU. Although encephalopathy and hypotension improved, patient developed worsening AKI on underlying renal disease in addition to anuria/oliguria. 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

## 2023-04-16 NOTE — Progress Notes (Signed)
New Admission Note:   Arrival Method: Stretcher Mental Orientation: Not responding to painful stimuli IV: Pain: Not alert Tubes: none  Droplet precaution implemented.  Patient arrived from ED somnolenced, Low BP and and temp on vital sings. MD and rapid response notified. Orders have been reviewed and implemented. Will continue to monitor the patient.

## 2023-04-16 NOTE — Progress Notes (Signed)
Patient arrived to unit obtunded. This RN plus charge nurse RN assessed patients belongings bag and found:  1 pair of tennis shoes 1 black sweat jacket 1 grey t-shirt 1 blue t-shirt 1 pair of black sweat pants 1 baby blue nike fanny pack 1 cell phone 1 black charger 1 wallet that contained a white visa card, 1 EBT card, 1 white health insurance card 1 lip gloss 1 hand lotion  3 pens  1 pair of black socks   Patients belongings returned to patient belongings bag, bag tied at top and placed in the corner of the patients room. Patient label and sticker attached to bag

## 2023-04-16 NOTE — Progress Notes (Deleted)
[facility] OF   ASSESSMENT / PLAN: Shirley [name] is a 47 y.o. *** handed female in need of permanent dialysis access. I reviewed options for dialysis in detail with the patient, including hemodialysis and peritoneal dialysis. I counseled the patient to ask their nephrologist about their candidacy for renal transplant. I counseled the patient that dialysis access requires surveillance and periodic maintenance. Plan to proceed with ***.   CHIEF COMPLAINT: ***  HISTORY OF PRESENT ILLNESS: Shirley [name] is a 47 y.o. female ***  VASCULAR SURGICAL HISTORY: ***  VASCULAR RISK FACTORS: {FINDINGS; POSITIVE NEGATIVE:[phone]} history of stroke / transient ischemic attack. {FINDINGS; POSITIVE NEGATIVE:[phone]} history of coronary artery disease. *** history of PCI. *** history of CABG.  {FINDINGS; POSITIVE NEGATIVE:[phone]} history of diabetes mellitus. Last A1c ***. {FINDINGS; POSITIVE NEGATIVE:[phone]} history of smoking. *** actively smoking. {FINDINGS; POSITIVE NEGATIVE:[phone]} history of hypertension. *** drug regimen with *** control. {FINDINGS; POSITIVE NEGATIVE:[phone]} history of chronic kidney disease.  Last GFR ***. CKD {stage:30421363}. {FINDINGS; POSITIVE NEGATIVE:[phone]} history of chronic obstructive pulmonary disease, treated with ***.  FUNCTIONAL STATUS: ECOG performance status: {findings; ecog performance status:31780} Ambulatory status: {TNHAmbulation:25868}  CAREY 1 AND 3 YEAR INDEX Female (2pts) 75-79 or 80-84 (2pts) >84 (3pts) Dependence in toileting (1pt) Partial or full dependence in dressing (1pt) History of malignant neoplasm (2pts) CHF (3pts) COPD (1pts) CKD (3pts)  0-3 pts 6% 1 year mortality ; 21% 3 year mortality 4-5 pts 12% 1 year mortality ; 36% 3 year mortality >5 pts 21% 1 year mortality; 54% 3 year mortality   Past Medical History:  Diagnosis Date   Abscess of tunica vaginalis    10/09- Abundant  S. aureus- sensitive to all abx   Anxiety    Blood dyscrasia    CAD (coronary artery disease) 06/15/2006   s/p Subendocardial MI with PDA angioplasty(no stent) on 06/15/06 and relook  cath 06/19/06 showed patency of site. Cath 12/10- no restenosis or significant CAD progression   CVA (cerebral vascular accident) (HCC) ~ 02/2014   denies residual on 04/22/2014   CVA (cerebral vascular accident) [facility])    history of remote right cerebellar infarct noted on head CT at least since 10/2011   Depression    Diabetes mellitus type 2, uncontrolled, with complications    Fibromyalgia    Gastritis    Gastroparesis    secondary to poorly controlled DM, last emptying study performed 01/2010  was normal but may be falsely positive as pt was on reglan   GERD (gastroesophageal reflux disease)    Hepatitis B, chronic (HCC)    Hep BeAb+,Hep B cAb+ & Hep BsAg+ (9/06)   History of pyelonephritis    H/o GrpB Pyelonephritis (9/06) and UTI- 07/11- E.Coli, 12/10- GBS   Hyperlipidemia    Hypertension    Iron deficiency anemia    Irregular menses    Small ovarian follicles seen on CT(9/06)   MI (myocardial infarction) (HCC) 05/2006   PDA percutaneous transluminal coronary angioplasty   Migraine    "weekly" (04/22/2014)   N&V (nausea and vomiting)    Chronic. Unclear etiology with multiple admission and ED visits. CT abdomen with and without contrast (02/2011)  showed no acute process. Gastic Emptying scan (01/2010) was normal. Ultrasound of the abdomen was within normal limits. Hepatitis B viral load was undectable. HIV NR. EGD - gastritis, Hpylori + s/p Rx   New onset of congestive heart failure (HCC) 05/16/2022   Obesity    OSA (obstructive sleep apnea)    "  suppose to wear mask but I don't" (04/22/2014)   Peripheral neuropathy    Pneumonia    "this is probably the 2nd or 3rd time I've had pneumonia" (04/22/2014)   Recurrent boils    Seasonal asthma    Substance abuse (HCC)    Thrombocytosis    Hem/Onc  suggested 2/2 chronic hepatits and/or iron deficiency anemia    Past Surgical History:  Procedure Laterality Date   CESAREAN SECTION  1997   CORONARY ANGIOPLASTY WITH STENT PLACEMENT  2008   "2 stents"   ESOPHAGOGASTRODUODENOSCOPY N/A 04/23/2014   Procedure: ESOPHAGOGASTRODUODENOSCOPY (EGD);  Surgeon: Vertell Novak., MD;  Location: [facility] ENDOSCOPY;  Service: Endoscopy;  Laterality: N/A;   INCISION AND DRAINAGE ABSCESS Right 04/17/2021   Procedure: INCISION AND DRAINAGE CHEST WALL ABSCESS;  Surgeon: Diamantina Monks, MD;  Location: [facility] OR;  Service: General;  Laterality: Right;   IR ANGIOGRAM PELVIS SELECTIVE OR SUPRASELECTIVE  12/07/2016   IR ANGIOGRAM PELVIS SELECTIVE OR SUPRASELECTIVE  12/07/2016   IR ANGIOGRAM SELECTIVE EACH ADDITIONAL VESSEL  12/07/2016   IR ANGIOGRAM SELECTIVE EACH ADDITIONAL VESSEL  12/07/2016   IR EMBO ARTERIAL NOT HEMORR HEMANG INC GUIDE ROADMAPPING  12/07/2016   IR RADIOLOGIST EVAL & MGMT  01/09/2017   IR US GUIDE VASC ACCESS RIGHT  12/07/2016   IRRIGATION AND DEBRIDEMENT FOOT Right 04/07/2020   Procedure: Incision and Drainage Right Foot;  Surgeon: Park Liter, DPM;  Location: [facility] ORS;  Service: Podiatry;  Laterality: Right;   RIGHT HEART CATH N/A 08/14/2022   Procedure: RIGHT HEART CATH;  Surgeon: Runell Gess, MD;  Location: [facility] INVASIVE CV LAB;  Service: Cardiovascular;  Laterality: N/A;    Family History  Problem Relation Age of Onset   Diabetes Father    Healthy Mother     Social History   Socioeconomic History   Marital status: Single    Spouse name: Not on file   Number of children: 2   Years of education: 11   Highest education level: Not on file  Occupational History   Occupation: other    Comment: unemployed    Comment: disability  Tobacco Use   Smoking status: Former    Current packs/day: 0.00    Types: Cigarettes    Quit date: 04/24/1996    Years since quitting: 26.9   Smokeless tobacco: Never   Tobacco comments:    quit smoking  cigarettes age 28  Vaping Use   Vaping status: Never Used  Substance and Sexual Activity   Alcohol use: No    Alcohol/week: 0.0 standard drinks of alcohol    Comment: 04/22/2014 "might have a few drinks a month"   Drug use: Not Currently    Types: Marijuana, Cocaine    Comment: 04/22/2104 "quit drugs ~ 1-2 yr ago"   Sexual activity: Yes    Birth control/protection: None  Other Topics Concern   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         Social Drivers of Corporate investment banker Strain: Low Risk  (08/09/2022)   Overall Financial Resource Strain (CARDIA)    Difficulty of Paying Living Expenses: Not hard at all  Food Insecurity: No Food Insecurity (04/16/2023)   Hunger Vital Sign    Worried About Running Out of Food in the Last Year: Never  true    Ran Out of Food in the Last Year: Never true  Transportation Needs: Patient Unable To Answer (04/16/2023)   PRAPARE - Transportation    Lack of Transportation (Medical): Patient unable to answer    Lack of Transportation (Non-Medical): Patient unable to answer  Physical Activity: Not on file  Stress: Not on file  Social Connections: Not on file  Intimate Partner Violence: Patient Unable To Answer (04/16/2023)   Humiliation, Afraid, Rape, and Kick questionnaire    Fear of Current or Ex-Partner: Patient unable to answer    Emotionally Abused: Patient unable to answer    Physically Abused: Patient unable to answer    Sexually Abused: Patient unable to answer    Allergies  Allergen Reactions   Morphine And Codeine Itching and Swelling   Zestril [Lisinopril] Nausea Only and Swelling    No current facility-administered medications for this visit.   No current outpatient medications on file.   Facility-Administered Medications Ordered in Other Visits  Medication Dose  Route Frequency Provider Last Rate Last Admin   0.9 %  sodium chloride infusion  250 mL Intravenous Continuous Norton Blizzard, NP   Held at 04/16/23 1903   albuterol (PROVENTIL) (2.5 MG/3ML) 0.083% nebulizer solution 2.5 mg  2.5 mg Nebulization Q4H PRN Norton Blizzard, NP       Chlorhexidine Gluconate Cloth 2 % PADS 6 each  6 each Topical Daily Danford, Earl Lites, MD   6 each at 04/16/23 1900   insulin aspart (novoLOG) injection 0-6 Units  0-6 Units Subcutaneous Q4H Selmer Dominion B, NP       naloxone [facility]) injection 0.4 mg  0.4 mg Intravenous PRN Howerter, Justin B, DO   0.2 mg at 04/16/23 1620   norepinephrine (LEVOPHED) 4mg  in (0.016 mg/mL) premix infusion  2-10 mcg/min Intravenous Titrated Selmer Dominion B, NP   Held at 04/16/23 1903   ondansetron [facility]) injection 4 mg  4 mg Intravenous Q6H PRN Howerter, Justin B, DO   4 mg at 04/16/23 0259   Oral care mouth rinse  15 mL Mouth Rinse PRN Cheri Fowler, MD       oseltamivir (TAMIFLU) capsule 30 mg  30 mg Oral Daily Danford, Earl Lites, MD   30 mg at 04/16/23 1347   [START ON 04/17/2023] pantoprazole (PROTONIX) injection 40 mg  40 mg Intravenous Q24H Selmer Dominion B, NP        PHYSICAL EXAM There were no vitals filed for this visit.  Constitutional: *** appearing. *** distress. Appears *** nourished.  Neurologic: CN ***. *** focal findings. *** sensory loss. Psychiatric: *** Mood and affect symmetric and appropriate. Eyes: *** No icterus. No conjunctival pallor. Ears, nose, throat: *** mucous membranes moist. Midline trachea.  Cardiac: *** rate and rhythm.  Respiratory: *** unlabored. Abdominal: *** soft, non-tender, non-distended.  Peripheral vascular: *** Extremity: *** edema. *** cyanosis. *** pallor.  Skin: *** gangrene. *** ulceration.  Lymphatic: *** Stemmer's sign. *** palpable lymphadenopathy.    PERTINENT LABORATORY AND RADIOLOGIC DATA  Most recent CBC    Latest Ref Rng & Units 04/16/2023    8:00 AM  04/15/2023    9:22 PM 08/15/2022    1:41 AM  CBC  WBC 4.0 - 10.5 K/uL 15.7  17.5  10.6   Hemoglobin 12.0 - 15.0 g/dL 40.9  9.4  8.7   Hematocrit 36.0 - 46.0 % 31.0  28.1  25.1   Platelets 150 - 400 K/uL 411  390  390  Most recent CMP    Latest Ref Rng & Units 04/16/2023    4:03 PM 04/16/2023    8:00 AM 04/15/2023    9:22 PM  CMP  Glucose 70 - 99 mg/dL 401  027  253   BUN 6 - 20 mg/dL 46  44  42   Creatinine 0.44 - 1.00 mg/dL 6.64  4.03  4.74   Sodium 135 - 145 mmol/L 138  138  140   Potassium 3.5 - 5.1 mmol/L 4.2  4.6  4.5   Chloride 98 - 111 mmol/L 113  111  112   CO2 22 - 32 mmol/L 14  17  17    Calcium 8.9 - 10.3 mg/dL 8.3  8.6  8.6   Total Protein 6.5 - 8.1 g/dL 6.0  7.5  7.0   Total Bilirubin <1.2 mg/dL 0.6  0.5  1.2   Alkaline Phos 38 - 126 U/L 53  66  64   AST 15 - 41 U/L 26  35  38   ALT 0 - 44 U/L 11  15  14      Renal function Estimated Creatinine Clearance: 16 mL/min (A) (by C-G formula based on SCr of 5.26 mg/dL (H)).  Hgb A1c MFr Bld (%)  Date Value  03/21/2022 5.8 (H)    LDL Cholesterol  Date Value Ref Range Status  07/21/2013 108 (H) 0 - 99 mg/dL Final    Comment:      Total Cholesterol/HDL Ratio:CHD Risk                        Coronary Heart Disease Risk Table                                        Men       Women          1/2 Average Risk              3.4        3.3              Average Risk              5.0        4.4           2X Average Risk              9.6        7.1           3X Average Risk             23.4       11.0 Use the calculated Patient Ratio above and the CHD Risk table  to determine the patient's CHD Risk. ATP III Classification (LDL):       < 100        mg/dL         Optimal      259 - 129     mg/dL         Near or Above Optimal      130 - 159     mg/dL         Borderline High      160 - 189     mg/dL         High       > 563  mg/dL         Very High       Vascular Imaging: ***  Chee Dimon N. Lenell Antu, MD  FACS [facility] Phone Number: [phone] 04/16/2023 9:21 PM   Total time spent on preparing this encounter including chart review, data review, collecting history, examining the patient, coordinating care for this {tnhtimebilling:26202}  Portions of this report may have been transcribed using voice recognition software.  Every effort has been made to ensure accuracy; however, inadvertent computerized transcription errors may still be present.

## 2023-04-16 NOTE — ED Notes (Signed)
ED TO INPATIENT HANDOFF REPORT  ED Nurse Name and Phone #: [phone]  S Name/Age/Gender Shirley [name] 47 y.o. female Room/Bed: 002C/002C  Code Status   Code Status: Full Code  Home/SNF/Other Home Patient oriented to: self, place, time, and situation Is this baseline? Yes   Triage Complete: Triage complete  Chief Complaint Atypical chest pain [R07.89] Influenza [J11.1]  Triage Note Patient brought in by EMS from home with CP and shortness of breath. Was seen at another facility for this where they gave her prednisone and azithromycin. She said both ended today but she does not feel any better. Reports a dry cough.    Allergies Allergies  Allergen Reactions   Morphine And Codeine Itching and Swelling   Zestril [Lisinopril] Nausea Only and Swelling    Level of Care/Admitting Diagnosis ED Disposition     ED Disposition  Admit   Condition  --   Comment  Hospital Area: [facility] [[id number]]  Level of Care: Med-Surg [16]  May admit patient to [facility] or [facility] if equivalent level of care is available:: Yes  Covid Evaluation: Asymptomatic - no recent exposure (last 10 days) testing not required  Diagnosis: Influenza [454098]  Admitting Physician: Alberteen Sam [[id number]]  Attending Physician: Alberteen Sam [[id number]]  Certification:: I certify this patient will need inpatient services for at least 2 midnights          B Medical/Surgery History Past Medical History:  Diagnosis Date   Abscess of tunica vaginalis    10/09- Abundant S. aureus- sensitive to all abx   Anxiety    Blood dyscrasia    CAD (coronary artery disease) 06/15/2006   s/p Subendocardial MI with PDA angioplasty(no stent) on 06/15/06 and relook  cath 06/19/06 showed patency of site. Cath 12/10- no restenosis or significant CAD progression   CVA (cerebral vascular accident) (HCC) ~ 02/2014   denies residual on 04/22/2014   CVA (cerebral vascular accident)  [facility])    history of remote right cerebellar infarct noted on head CT at least since 10/2011   Depression    Diabetes mellitus type 2, uncontrolled, with complications    Fibromyalgia    Gastritis    Gastroparesis    secondary to poorly controlled DM, last emptying study performed 01/2010  was normal but may be falsely positive as pt was on reglan   GERD (gastroesophageal reflux disease)    Hepatitis B, chronic (HCC)    Hep BeAb+,Hep B cAb+ & Hep BsAg+ (9/06)   History of pyelonephritis    H/o GrpB Pyelonephritis (9/06) and UTI- 07/11- E.Coli, 12/10- GBS   Hyperlipidemia    Hypertension    Iron deficiency anemia    Irregular menses    Small ovarian follicles seen on CT(9/06)   MI (myocardial infarction) (HCC) 05/2006   PDA percutaneous transluminal coronary angioplasty   Migraine    "weekly" (04/22/2014)   N&V (nausea and vomiting)    Chronic. Unclear etiology with multiple admission and ED visits. CT abdomen with and without contrast (02/2011)  showed no acute process. Gastic Emptying scan (01/2010) was normal. Ultrasound of the abdomen was within normal limits. Hepatitis B viral load was undectable. HIV NR. EGD - gastritis, Hpylori + s/p Rx   New onset of congestive heart failure (HCC) 05/16/2022   Obesity    OSA (obstructive sleep apnea)    "suppose to wear mask but I don't" (04/22/2014)   Peripheral neuropathy    Pneumonia    "this  is probably the 2nd or 3rd time I've had pneumonia" (04/22/2014)   Recurrent boils    Seasonal asthma    Substance abuse (HCC)    Thrombocytosis    Hem/Onc suggested 2/2 chronic hepatits and/or iron deficiency anemia   Past Surgical History:  Procedure Laterality Date   CESAREAN SECTION  1997   CORONARY ANGIOPLASTY WITH STENT PLACEMENT  2008   "2 stents"   ESOPHAGOGASTRODUODENOSCOPY N/A 04/23/2014   Procedure: ESOPHAGOGASTRODUODENOSCOPY (EGD);  Surgeon: Vertell Novak., MD;  Location: [facility] ENDOSCOPY;  Service: Endoscopy;  Laterality: N/A;    INCISION AND DRAINAGE ABSCESS Right 04/17/2021   Procedure: INCISION AND DRAINAGE CHEST WALL ABSCESS;  Surgeon: Diamantina Monks, MD;  Location: [facility] OR;  Service: General;  Laterality: Right;   IR ANGIOGRAM PELVIS SELECTIVE OR SUPRASELECTIVE  12/07/2016   IR ANGIOGRAM PELVIS SELECTIVE OR SUPRASELECTIVE  12/07/2016   IR ANGIOGRAM SELECTIVE EACH ADDITIONAL VESSEL  12/07/2016   IR ANGIOGRAM SELECTIVE EACH ADDITIONAL VESSEL  12/07/2016   IR EMBO ARTERIAL NOT HEMORR HEMANG INC GUIDE ROADMAPPING  12/07/2016   IR RADIOLOGIST EVAL & MGMT  01/09/2017   IR US GUIDE VASC ACCESS RIGHT  12/07/2016   IRRIGATION AND DEBRIDEMENT FOOT Right 04/07/2020   Procedure: Incision and Drainage Right Foot;  Surgeon: Park Liter, DPM;  Location: [facility] ORS;  Service: Podiatry;  Laterality: Right;   RIGHT HEART CATH N/A 08/14/2022   Procedure: RIGHT HEART CATH;  Surgeon: Runell Gess, MD;  Location: [facility] INVASIVE CV LAB;  Service: Cardiovascular;  Laterality: N/A;     A IV Location/Drains/Wounds Patient Lines/Drains/Airways Status     Active Line/Drains/Airways     Name Placement date Placement time Site Days   Peripheral IV 04/15/23 Anterior;Proximal;Right Antecubital 04/15/23  2116  Antecubital  1   Peripheral IV 04/16/23 22 G 1.75" Left;Anterior Forearm 04/16/23  0508  Forearm  less than 1            Intake/Output Last 24 hours  Intake/Output Summary (Last 24 hours) at 04/16/2023 1352 Last data filed at 04/16/2023 0825 Gross per 24 hour  Intake 120 ml  Output --  Net 120 ml    Labs/Imaging Results for orders placed or performed during the hospital encounter of 04/15/23 (from the past 48 hours)  Brain natriuretic peptide     Status: Abnormal   Collection Time: 04/15/23  9:20 PM  Result Value Ref Range   B Natriuretic Peptide 1,788.7 (H) 0.0 - 100.0 pg/mL    Comment: Performed at [facility] Lab, [address] [place], [place] [address]  D-dimer, quantitative     Status: Abnormal   Collection  Time: 04/15/23  9:20 PM  Result Value Ref Range   D-Dimer, Quant 1.95 (H) 0.00 - 0.50 ug/mL-FEU    Comment: (NOTE) At the manufacturer cut-off value of 0.5 g/mL FEU, this assay has a negative predictive value of 95-100%.This assay is intended for use in conjunction with a clinical pretest probability (PTP) assessment model to exclude pulmonary embolism (PE) and deep venous thrombosis (DVT) in outpatients suspected of PE or DVT. Results should be correlated with clinical presentation. Performed at [facility] Lab, [address], [place], [place] [address]   CBC with Differential     Status: Abnormal   Collection Time: 04/15/23  9:22 PM  Result Value Ref Range   WBC 17.5 (H) 4.0 - 10.5 K/uL   RBC 3.52 (L) 3.87 - 5.11 MIL/uL   Hemoglobin 9.4 (L) 12.0 -  15.0 g/dL   HCT 62.9 (L) 52.8 - 41.3 %   MCV 79.8 (L) 80.0 - 100.0 fL   MCH 26.7 26.0 - 34.0 pg   MCHC 33.5 30.0 - 36.0 g/dL   RDW 24.4 (H) 01.0 - 27.2 %   Platelets 390 150 - 400 K/uL   nRBC 0.0 0.0 - 0.2 %   Neutrophils Relative % 82 %   Neutro Abs 14.5 (H) 1.7 - 7.7 K/uL   Lymphocytes Relative 10 %   Lymphs Abs 1.8 0.7 - 4.0 K/uL   Monocytes Relative 6 %   Monocytes Absolute 1.0 0.1 - 1.0 K/uL   Eosinophils Relative 1 %   Eosinophils Absolute 0.1 0.0 - 0.5 K/uL   Basophils Relative 0 %   Basophils Absolute 0.1 0.0 - 0.1 K/uL   Immature Granulocytes 1 %   Abs Immature Granulocytes 0.10 (H) 0.00 - 0.07 K/uL    Comment: Performed at [facility], [address] [place], [place] [address]  Troponin I (High Sensitivity)     Status: Abnormal   Collection Time: 04/15/23  9:22 PM  Result Value Ref Range   Troponin I (High Sensitivity) 61 (H) <18 ng/L    Comment: (NOTE) Elevated high sensitivity troponin I (hsTnI) values and significant  changes across serial measurements may suggest ACS but many other  chronic and acute conditions are known to elevate hsTnI results.  Refer to the "Links" section for chest pain  algorithms and additional  guidance. Performed at [facility], [address], [place], [place] [address]   Comprehensive metabolic panel     Status: Abnormal   Collection Time: 04/15/23  9:22 PM  Result Value Ref Range   Sodium 140 135 - 145 mmol/L   Potassium 4.5 3.5 - 5.1 mmol/L    Comment: HEMOLYSIS AT THIS LEVEL MAY AFFECT RESULT   Chloride 112 (H) 98 - 111 mmol/L   CO2 17 (L) 22 - 32 mmol/L   Glucose, Bld 105 (H) 70 - 99 mg/dL    Comment: Glucose reference range applies only to samples taken after fasting for at least 8 hours.   BUN 42 (H) 6 - 20 mg/dL   Creatinine, Ser 4.25 (H) 0.44 - 1.00 mg/dL   Calcium 8.6 (L) 8.9 - 10.3 mg/dL   Total Protein 7.0 6.5 - 8.1 g/dL   Albumin 2.5 (L) 3.5 - 5.0 g/dL   AST 38 15 - 41 U/L    Comment: HEMOLYSIS AT THIS LEVEL MAY AFFECT RESULT   ALT 14 0 - 44 U/L    Comment: HEMOLYSIS AT THIS LEVEL MAY AFFECT RESULT   Alkaline Phosphatase 64 38 - 126 U/L   Total Bilirubin 1.2 (H) <1.2 mg/dL    Comment: HEMOLYSIS AT THIS LEVEL MAY AFFECT RESULT   GFR, Estimated 11 (L) >60 mL/min    Comment: (NOTE) Calculated using the CKD-EPI Creatinine Equation (2021)    Anion gap 11 5 - 15    Comment: Performed at [facility] Lab, [address], [place], [place] [address]  Resp panel by RT-PCR (RSV, Flu A&B, Covid) Anterior Nasal Swab     Status: Abnormal   Collection Time: 04/15/23  9:23 PM   Specimen: Anterior Nasal Swab  Result Value Ref Range   SARS Coronavirus 2 by RT PCR NEGATIVE NEGATIVE   Influenza A by PCR POSITIVE (A) NEGATIVE   Influenza B by PCR NEGATIVE NEGATIVE    Comment: (NOTE) The Xpert Xpress SARS-CoV-2/FLU/RSV plus  assay is intended as an aid in the diagnosis of influenza from Nasopharyngeal swab specimens and should not be used as a sole basis for treatment. Nasal washings and aspirates are unacceptable for Xpert Xpress SARS-CoV-2/FLU/RSV testing.  Fact Sheet for  Patients: BloggerCourse.com  Fact Sheet for Healthcare Providers: SeriousBroker.it  This test is not yet approved or cleared by the [place] FDA and has been authorized for detection and/or diagnosis of SARS-CoV-2 by FDA under an Emergency Use Authorization (EUA). This EUA will remain in effect (meaning this test can be used) for the duration of the COVID-19 declaration under Section 564(b)(1) of the Act, 21 U.S.C. section 360bbb-3(b)(1), unless the authorization is terminated or revoked.     Resp Syncytial Virus by PCR NEGATIVE NEGATIVE    Comment: (NOTE) Fact Sheet for Patients: BloggerCourse.com  Fact Sheet for Healthcare Providers: SeriousBroker.it  This test is not yet approved or cleared by the [place] FDA and has been authorized for detection and/or diagnosis of SARS-CoV-2 by FDA under an Emergency Use Authorization (EUA). This EUA will remain in effect (meaning this test can be used) for the duration of the COVID-19 declaration under Section 564(b)(1) of the Act, 21 U.S.C. section 360bbb-3(b)(1), unless the authorization is terminated or revoked.  Performed at [facility], [address] [place], [place] [address]   Troponin I (High Sensitivity)     Status: Abnormal   Collection Time: 04/15/23 11:27 PM  Result Value Ref Range   Troponin I (High Sensitivity) 59 (H) <18 ng/L    Comment: DELTA CHECK NOTED (NOTE) Elevated high sensitivity troponin I (hsTnI) values and significant  changes across serial measurements may suggest ACS but many other  chronic and acute conditions are known to elevate hsTnI results.  Refer to the "Links" section for chest pain algorithms and additional  guidance. Performed at [facility], [address] [place], [place] [address]   Heparin level (unfractionated)     Status: Abnormal   Collection Time: 04/15/23  11:27 PM  Result Value Ref Range   Heparin Unfractionated <0.10 (L) 0.30 - 0.70 IU/mL    Comment: (NOTE) The clinical reportable range upper limit is being lowered to >1.10 to align with the FDA approved guidance for the current laboratory assay.  If heparin results are below expected values, and patient dosage has  been confirmed, suggest follow up testing of antithrombin III levels. Performed at [facility], [address], [place], [place] [address]   APTT     Status: None   Collection Time: 04/15/23 11:27 PM  Result Value Ref Range   aPTT 25 24 - 36 seconds    Comment: Performed at [facility] Lab, [address], [place], [place] [address]  Magnesium     Status: None   Collection Time: 04/15/23 11:27 PM  Result Value Ref Range   Magnesium 1.9 1.7 - 2.4 mg/dL    Comment: HEMOLYSIS AT THIS LEVEL MAY AFFECT RESULT Performed at [facility], [address], [place], [place] [address]   Urinalysis, Complete w Microscopic -Urine, Clean Catch     Status: Abnormal   Collection Time: 04/16/23 12:17 AM  Result Value Ref Range   Color, Urine YELLOW YELLOW   APPearance CLEAR CLEAR   Specific Gravity, Urine 1.014 1.005 - 1.030   pH 6.0 5.0 - 8.0   Glucose, UA 150 (A) NEGATIVE mg/dL   Hgb urine dipstick SMALL (A) NEGATIVE   Bilirubin Urine NEGATIVE  NEGATIVE   Ketones, ur NEGATIVE NEGATIVE mg/dL   Protein, ur >=161 (A) NEGATIVE mg/dL   Nitrite NEGATIVE NEGATIVE   Leukocytes,Ua NEGATIVE NEGATIVE   RBC / HPF 0-5 0 - 5 RBC/hpf   WBC, UA 0-5 0 - 5 WBC/hpf   Bacteria, UA FEW (A) NONE SEEN   Squamous Epithelial / HPF 0-5 0 - 5 /HPF    Comment: Performed at [facility], [address], [place], [place] [address]  Procalcitonin     Status: None   Collection Time: 04/16/23  2:16 AM  Result Value Ref Range   Procalcitonin 0.12 ng/mL    Comment:        Interpretation: PCT (Procalcitonin) <= 0.5 ng/mL: Systemic infection (sepsis) is not likely. Local bacterial  infection is possible. (NOTE)       Sepsis PCT Algorithm           Lower Respiratory Tract                                      Infection PCT Algorithm    ----------------------------     ----------------------------         PCT < 0.25 ng/mL                PCT < 0.10 ng/mL          Strongly encourage             Strongly discourage   discontinuation of antibiotics    initiation of antibiotics    ----------------------------     -----------------------------       PCT 0.25 - 0.50 ng/mL            PCT 0.10 - 0.25 ng/mL               OR       >80% decrease in PCT            Discourage initiation of                                            antibiotics      Encourage discontinuation           of antibiotics    ----------------------------     -----------------------------         PCT >= 0.50 ng/mL              PCT 0.26 - 0.50 ng/mL               AND        <80% decrease in PCT             Encourage initiation of                                             antibiotics       Encourage continuation           of antibiotics    ----------------------------     -----------------------------        PCT >= 0.50 ng/mL                  PCT > 0.50 ng/mL  AND         increase in PCT                  Strongly encourage                                      initiation of antibiotics    Strongly encourage escalation           of antibiotics                                     -----------------------------                                           PCT <= 0.25 ng/mL                                                 OR                                        > 80% decrease in PCT                                      Discontinue / Do not initiate                                             antibiotics  Performed at [facility] Lab, [address], [place], [place] [address]   Sodium, urine, random     Status: None   Collection Time: 04/16/23  2:16 AM  Result Value Ref Range    Sodium, Ur 63 mmol/L    Comment: Performed at [facility], [address], [place], [place] [address]  Creatinine, urine, random     Status: None   Collection Time: 04/16/23  2:16 AM  Result Value Ref Range   Creatinine, Urine 91 mg/dL    Comment: Performed at [facility], [address] [place], [place] [address]  I-Stat CG4 Lactic Acid     Status: None   Collection Time: 04/16/23  4:17 AM  Result Value Ref Range   Lactic Acid, Venous 0.6 0.5 - 1.9 mmol/L  I-Stat CG4 Lactic Acid     Status: None   Collection Time: 04/16/23  5:51 AM  Result Value Ref Range   Lactic Acid, Venous 0.5 0.5 - 1.9 mmol/L  Heparin level (unfractionated)     Status: Abnormal   Collection Time: 04/16/23  8:00 AM  Result Value Ref Range   Heparin Unfractionated 0.19 (L) 0.30 - 0.70 IU/mL    Comment: (NOTE) The clinical reportable range upper limit is being lowered to >1.10 to align with the FDA approved guidance for the current laboratory assay.  If heparin results are below expected values, and patient dosage  has  been confirmed, suggest follow up testing of antithrombin III levels. Performed at [facility], [address] [place], [place] [address]   CBC with Differential/Platelet     Status: Abnormal   Collection Time: 04/16/23  8:00 AM  Result Value Ref Range   WBC 15.7 (H) 4.0 - 10.5 K/uL   RBC 3.87 3.87 - 5.11 MIL/uL   Hemoglobin 10.5 (L) 12.0 - 15.0 g/dL   HCT 60.4 (L) 54.0 - 98.1 %   MCV 80.1 80.0 - 100.0 fL   MCH 27.1 26.0 - 34.0 pg   MCHC 33.9 30.0 - 36.0 g/dL   RDW 19.1 (H) 47.8 - 29.5 %   Platelets 411 (H) 150 - 400 K/uL   nRBC 0.0 0.0 - 0.2 %   Neutrophils Relative % 90 %   Neutro Abs 14.1 (H) 1.7 - 7.7 K/uL   Lymphocytes Relative 4 %   Lymphs Abs 0.6 (L) 0.7 - 4.0 K/uL   Monocytes Relative 5 %   Monocytes Absolute 0.8 0.1 - 1.0 K/uL   Eosinophils Relative 0 %   Eosinophils Absolute 0.0 0.0 - 0.5 K/uL   Basophils Relative 0 %   Basophils Absolute 0.0 0.0 -  0.1 K/uL   Immature Granulocytes 1 %   Abs Immature Granulocytes 0.10 (H) 0.00 - 0.07 K/uL    Comment: Performed at [facility] Lab, [address] [place], [place] [address]  Comprehensive metabolic panel     Status: Abnormal   Collection Time: 04/16/23  8:00 AM  Result Value Ref Range   Sodium 138 135 - 145 mmol/L   Potassium 4.6 3.5 - 5.1 mmol/L   Chloride 111 98 - 111 mmol/L   CO2 17 (L) 22 - 32 mmol/L   Glucose, Bld 155 (H) 70 - 99 mg/dL    Comment: Glucose reference range applies only to samples taken after fasting for at least 8 hours.   BUN 44 (H) 6 - 20 mg/dL   Creatinine, Ser 8.65 (H) 0.44 - 1.00 mg/dL   Calcium 8.6 (L) 8.9 - 10.3 mg/dL   Total Protein 7.5 6.5 - 8.1 g/dL   Albumin 2.6 (L) 3.5 - 5.0 g/dL   AST 35 15 - 41 U/L   ALT 15 0 - 44 U/L   Alkaline Phosphatase 66 38 - 126 U/L   Total Bilirubin 0.5 <1.2 mg/dL   GFR, Estimated 11 (L) >60 mL/min    Comment: (NOTE) Calculated using the CKD-EPI Creatinine Equation (2021)    Anion gap 10 5 - 15    Comment: Performed at [facility] Lab, [address] [place], [place] [address]  Magnesium     Status: None   Collection Time: 04/16/23  8:00 AM  Result Value Ref Range   Magnesium 1.9 1.7 - 2.4 mg/dL    Comment: Performed at [facility], [address], [place], [place] [address]  Phosphorus     Status: Abnormal   Collection Time: 04/16/23  8:00 AM  Result Value Ref Range   Phosphorus 4.8 (H) 2.5 - 4.6 mg/dL    Comment: Performed at [facility] Lab, [address] [place], [place] [address]  Lipase, blood     Status: None   Collection Time: 04/16/23  8:00 AM  Result Value Ref Range   Lipase 24 11 - 51 U/L    Comment: Performed at [facility], [address] [place], [place] [address]  CBG monitoring, ED  Status: Abnormal   Collection Time: 04/16/23  8:05 AM  Result Value Ref Range   Glucose-Capillary 164 (H) 70 - 99 mg/dL    Comment: Glucose reference range applies only to samples  taken after fasting for at least 8 hours.  CBG monitoring, ED     Status: None   Collection Time: 04/16/23  1:08 PM  Result Value Ref Range   Glucose-Capillary 90 70 - 99 mg/dL    Comment: Glucose reference range applies only to samples taken after fasting for at least 8 hours.   NM Pulmonary Perfusion Result Date: 04/16/2023 CLINICAL DATA:  Pulmonary embolism suspected, high probability. Shortness of breath. History of chronic kidney disease. EXAM: NUCLEAR MEDICINE PERFUSION LUNG SCAN TECHNIQUE: Perfusion images were obtained in multiple projections after intravenous injection of radiopharmaceutical. Ventilation scans intentionally deferred if perfusion scan and chest x-ray adequate for interpretation since COVID 19 epidemic. RADIOPHARMACEUTICALS:  4.4 mCi Tc-25m MAA IV COMPARISON:  Chest radiographs 04/15/2023 and 08/07/2022. Nuclear medicine perfusion lung scan 03/21/2022 and 11/25/2019. FINDINGS: There is improved perfusion to the superior segment of the right lower lobe compared with the previous study. Patchy perfusion to both lungs consistent with chronic obstructive disease and small pleural effusions. There are no wedge-shaped perfusion defects to suggest pulmonary embolism. IMPRESSION: No evidence of acute pulmonary embolism on perfusion scintigraphy by PISAPED criteria. Consider correlation with lower extremity venous Doppler ultrasound. Electronically Signed   By: Carey Bullocks M.D.   On: 04/16/2023 09:34   DG Chest 2 View Result Date: 04/15/2023 CLINICAL DATA:  Shortness of breath EXAM: CHEST - 2 VIEW COMPARISON:  None Available. FINDINGS: Heart is upper limits normal in size. Mediastinal contours within normal limits. No confluent airspace opacities, effusions or edema. No acute bony abnormality. IMPRESSION: No active cardiopulmonary disease. Electronically Signed   By: Charlett Nose M.D.   On: 04/15/2023 20:49    Pending Labs Unresulted Labs (From admission, onward)     Start      Ordered   04/16/23 0018  Hemoglobin A1c  Add-on,   AD       Comments: To assess prior glycemic control    04/16/23 0017   04/16/23 0016  Culture, blood (Routine X 2) w Reflex to ID Panel  BLOOD CULTURE X 2,   R (with TIMED occurrences)      04/16/23 0016            Vitals/Pain Today's Vitals   04/16/23 1008 04/16/23 1115 04/16/23 1251 04/16/23 1346  BP:  112/71  109/70  Pulse:  97    Resp:  18    Temp:   (!) 101.2 F (38.4 C)   TempSrc:   Oral   SpO2:  100%    Weight:      Height:      PainSc: Asleep       Isolation Precautions Droplet precaution  Medications Medications  albuterol (VENTOLIN HFA) 108 (90 Base) MCG/ACT inhaler 2 puff (has no administration in time range)  acetaminophen (TYLENOL) tablet 650 mg (650 mg Oral Given 04/16/23 1346)    Or  acetaminophen (TYLENOL) suppository 650 mg ( Rectal See Alternative 04/16/23 1346)  melatonin tablet 3 mg (has no administration in time range)  ondansetron (ZOFRAN) injection 4 mg (4 mg Intravenous Given 04/16/23 0259)  naloxone (NARCAN) injection 0.4 mg (has no administration in time range)  guaiFENesin (MUCINEX) 12 hr tablet 600 mg (600 mg Oral Given 04/16/23 1000)  benzonatate (TESSALON) capsule 200 mg (200 mg Oral Given  04/16/23 0035)  insulin aspart (novoLOG) injection 0-6 Units ( Subcutaneous Not Given 04/16/23 1309)  amLODipine (NORVASC) tablet 10 mg (10 mg Oral Given 04/16/23 1000)  atorvastatin (LIPITOR) tablet 40 mg (40 mg Oral Given 04/16/23 1000)  carvedilol (COREG) tablet 25 mg (25 mg Oral Given 04/16/23 0751)  famotidine (PEPCID) tablet 20 mg (has no administration in time range)  ferrous sulfate tablet 325 mg (325 mg Oral Given 04/16/23 0751)  hydrALAZINE (APRESOLINE) tablet 100 mg (100 mg Oral Given 04/16/23 1346)  pantoprazole (PROTONIX) EC tablet 40 mg (40 mg Oral Given 04/16/23 1006)  oxyCODONE-acetaminophen (PERCOCET/ROXICET) 5-325 MG per tablet 2 tablet (2 tablets Oral Given 04/16/23 0802)   prochlorperazine (COMPAZINE) injection 10 mg (10 mg Intravenous Given 04/16/23 0747)  oseltamivir (TAMIFLU) capsule 30 mg (30 mg Oral Given 04/16/23 1347)  0.9 %  sodium chloride infusion ( Intravenous New Bag/Given 04/16/23 1349)  heparin bolus via infusion 4,800 Units (4,800 Units Intravenous Bolus from Bag 04/15/23 2358)  technetium albumin aggregated (MAA) injection solution 4.4 millicurie (4.4 millicuries Intravenous Contrast Given 04/16/23 0900)    Mobility walks     Focused Assessments Pulmonary Assessment Handoff:  Lung sounds: L Breath Sounds: Inspiratory wheezes R Breath Sounds: Diminished O2 Device: Room Air      R Recommendations: See Admitting Provider Note  Report given to:   Additional Notes: pt has been sleeping much of the day but when she wakes up will answer questions.

## 2023-04-16 NOTE — Significant Event (Signed)
Rapid Response Event Note   Reason for Call :  Unresponsive, just arrived from ED, second set of eyes  Initial Focused Assessment:  Patient moaning on ED stretcher with eyes closed; does not open eyes to painful stimuli, occasionally withdraws. Patient diaphoretic. Lungs clear/diminished. Pupils 2-3 and sluggish. Patient noted to have saliva pooled in mouth, oral cavity suctioned; delayed and weak gag noted.   88/50 (58) HR 98 RR 21 O2 96% RA CBG 100 99.7 rectal  Interventions:  CBG 1L NS bolus followed by 1L LR bolus MD to bedside ABG, labs Narcan given, 0.2mg  per discussion with MD CCM consult, to bedside Repeat narcan, gave remaining 0.2mg   CT head  Approximately 1600 patient began spontaneously opening eyes briefly and will move bilateral arms, though does not follow command. Gag reflex improved. Moaning continuously. SBP remains in 90s with MAP hovering 65.  Plan of Care:  Transfer ICU 2M09  Event Summary:  MD Notified: C. Danford MD Call Time: 1438 Arrival Time: 1443 End Time: 1700  Truddie Crumble, RN

## 2023-04-16 NOTE — Consult Note (Signed)
NAME:  Shirley [name], MRN:  [id number], DOB:  02-Jul-1975, LOS: 1 ADMISSION DATE:  04/15/2023, CONSULTATION DATE:  04/16/23 REFERRING MD:  TRH, CHIEF COMPLAINT:  AMS, hypotension   History of Present Illness:  Pt encephalopathic, therefore HPI obtained from EMR review.  56 yoF with PMH of HFrEF (02/2022 EF 45-50%, PASP 35.9, normal RVSF, mild to mod TR), CKD4 (baseline sCr 2.7- 3.8), PE not on AC, DMT2, HTN, GERD, chronic pain, and IDA who was admitted to [facility] on 12/22 for atypical chest pain, suspected pleuritic, Flu A positive, sepsis, and AKI.  Presented for worsening SOB and right sided CP after completing one week of azithromycin.  Workup also notable for CXR without acute process.  EKG has been non acute, BNP 1700,  and trop hs 61> 59 but chronically elevated in the 30-40's, PCT 0.12.  Was started on heparin given hx of PE, taken off AC apparently months ago, with chest pain with AKI/ CKD limiting CTA.  Underwent VQ scan 12/23 am which did not show evidence of perfusion defect concerning for PE therefore heparin stopped.  Was normotensive and received her home antihypertensives along with her oxycodone.  Once arrival to floor, pt noted to be acutely encephalopathic and hypotensive.  RRT initiated.  Glucose 100.  Half dose narcan given without significant response.  NS bolus initiated with some response in BP but remains borderline.  PCCM called for ongoing encephalopathy and concern for airway protection.    Pertinent  Medical History  HFrEF, CKD4 (baseline sCr 2.7- 3.8), PE not on AC, DMT2, HTN, GERD, IDA  Significant Hospital Events: Including procedures, antibiotic start and stop dates in addition to other pertinent events     Interim History / Subjective:   Objective   Blood pressure (!) 88/53, pulse 87, temperature 99.7 F (37.6 C), temperature source Rectal, resp. rate 20, height 5\' 7"  (1.702 m), weight 89.4 kg, last menstrual period 01/22/2021, SpO2 98%.        Intake/Output  Summary (Last 24 hours) at 04/16/2023 1631 Last data filed at 04/16/2023 0825 Gross per 24 hour  Intake 120 ml  Output --  Net 120 ml   Filed Weights   04/15/23 2300  Weight: 89.4 kg    Examination: General:  well nourished adult female sitting upright in bed, moaning HEENT: MM pink/moist, pupils 3/r, anicteric, evidence of prior drooling to shirt, no obvious nuchal rigidity/ turns head normally  Neuro: responded to verbal with opening eyes and squeezing hands but then nearly obtunded, responsive to noxious stimuli, localizing, spont movement UE, RLE> LLE withdrawal to noxious  CV: rr, NSR, no obvious murmur PULM:  rr 20, clear upper, faint rales in right base, no wheeze, diminshed in bases, moaning, +spont cough GI: obese, soft, bs+, NT Extremities: warm/dry, trace LE tibial edema, left calf more tense, no warmth Skin: no rashes   Labs reviewed   Resolved Hospital Problem list    Assessment & Plan:   Acute encephalopathy  - ddx> acute hypotension/ hypoperfusion, r/o ICH process, worsening sepsis, ? Narcotic unintentional OD in setting of AoCKD P:  - transfer to ICU for close neuro monitoring and airway watch.  Currently protecting airway.  ABG reassuring.   - NPO w/ aspiration precautions - repeated half dose narcan> no significant improvement noted, pupils are not pinpoint - monitor CBG as below - stat CTH - neuro checks - hold further sedating meds/ narcotics  - add on UDS (chronically on oxy at home) - cont neuro protective  measures/ supportive care   Leukocytosis  Hypotension - did receive her home norvasc 10mg , coreg 25mg , this am and most recently hydralazine 100mg  at 1346.  - completing 1.5L bolus, fluid responsive thus far.  Questionable if narcan helped.  Would avoid further fluids with hx of HFrEF - goal MAP > 65, consider peripheral NE given hx of HFrEF - check lactic, repeat PCT  - CXR pending - hold empiric abx for now, likely viral process but high risk  for aspiration event  - consider echo - tylenol prn  - trend CBC/ fever curve    Flu A - O2 sats currently stable, prn supplemental O2 - cont renal adjusted oseltamivir when safe to take POs - pulm hygiene - droplet precautions    AoCKD stage 4 NAMA - baseline sCr 2.7- 3.8, FENa high but is on home diurectics - repeat BMET w/sCr now 5.26 with no significant electrolyte derangements - check CK  - strict I/Os, monitor for urinary retention - daily BMET, if worsening will need [place], nephrology consult - consider adding oral bicarb when taking POs   Chest pain, suspected pleuritic Hx PE not currently on [facility] - EKG non acute on admit, hx chronically elevated trop hs - VQ scan today negative for PE.  - will check LE dopplers    Hx HFrEF, HTN -(02/2022 EF 45-50%, PASP 35.9, normal RVSF, mild to mod TR) P:  - hold further antihypertensives with hypotension - monitor fluid status closely  - LFTs ok, BNP/ trop chronically elevated    GERD - PPI IV for now   IDA - hold ferrous sulfate  - trend CBC, H/H stable   HLD - hold statin while NPO   Best Practice (right click and "Reselect all SmartList Selections" daily)   Diet/type: NPO DVT prophylaxis prophylactic heparin  Pressure ulcer(s): N/A GI prophylaxis: PPI Lines: N/A Foley:  N/A Code Status:  full code Last date of multidisciplinary goals of care discussion [pending]  Attempted to reach NOK> daughter LA-precious not a working number, [employer] manager (daughter) rang but then message not available, then [employer], rang, no answer/ no VM.     Labs   CBC: Recent Labs  Lab 04/15/23 2122 04/16/23 0800  WBC 17.5* 15.7*  NEUTROABS 14.5* 14.1*  HGB 9.4* 10.5*  HCT 28.1* 31.0*  MCV 79.8* 80.1  PLT 390 411*    Basic Metabolic Panel: Recent Labs  Lab 04/15/23 2122 04/15/23 2327 04/16/23 0800  NA 140  --  138  K 4.5  --  4.6  CL 112*  --  111  CO2 17*  --  17*  GLUCOSE 105*  --  155*  BUN 42*  --  44*   CREATININE 4.51*  --  4.73*  CALCIUM 8.6*  --  8.6*  MG  --  1.9 1.9  PHOS  --   --  4.8*   GFR: Estimated Creatinine Clearance: 16.5 mL/min (A) (by C-G formula based on SCr of 4.73 mg/dL (H)). Recent Labs  Lab 04/15/23 2122 04/16/23 0216 04/16/23 0417 04/16/23 0551 04/16/23 0800  PROCALCITON  --  0.12  --   --   --   WBC 17.5*  --   --   --  15.7*  LATICACIDVEN  --   --  0.6 0.5  --     Liver Function Tests: Recent Labs  Lab 04/15/23 2122 04/16/23 0800  AST 38 35  ALT 14 15  ALKPHOS 64 66  BILITOT 1.2* 0.5  PROT  7.0 7.5  ALBUMIN 2.5* 2.6*   Recent Labs  Lab 04/16/23 0800  LIPASE 24   No results for input(s): "AMMONIA" in the last 168 hours.  ABG    Component Value Date/Time   PHART 7.3 (L) 04/16/2023 1510   PCO2ART 31 (L) 04/16/2023 1510   PO2ART 83 04/16/2023 1510   HCO3 15.1 (L) 04/16/2023 1510   TCO2 26 08/14/2022 0930   TCO2 26 08/14/2022 0930   ACIDBASEDEF 10.1 (H) 04/16/2023 1510   O2SAT 98.1 04/16/2023 1510     Coagulation Profile: No results for input(s): "INR", "PROTIME" in the last 168 hours.  Cardiac Enzymes: No results for input(s): "CKTOTAL", "CKMB", "CKMBINDEX", "TROPONINI" in the last 168 hours.  HbA1C: Hgb A1c MFr Bld  Date/Time Value Ref Range Status  03/21/2022 06:00 AM 5.8 (H) 4.8 - 5.6 % Final    Comment:    (NOTE)         Prediabetes: 5.7 - 6.4         Diabetes: >6.4         Glycemic control for adults with diabetes: <7.0   04/17/2021 01:22 PM 13.5 (H) 4.8 - 5.6 % Final    Comment:    (NOTE) Pre diabetes:          5.7%-6.4%  Diabetes:              >6.4%  Glycemic control for   <7.0% adults with diabetes     CBG: Recent Labs  Lab 04/16/23 0805 04/16/23 1308 04/16/23 1439  GLUCAP 164* 90 100*    Review of Systems:   unable  Past Medical History:  She,  has a past medical history of Abscess of tunica vaginalis, Anxiety, Blood dyscrasia, CAD (coronary artery disease) (06/15/2006), CVA (cerebral vascular  accident) (HCC) (~ 02/2014), CVA (cerebral vascular accident) (HCC), Depression, Diabetes mellitus type 2, uncontrolled, with complications, Fibromyalgia, Gastritis, Gastroparesis, GERD (gastroesophageal reflux disease), Hepatitis B, chronic (HCC), History of pyelonephritis, Hyperlipidemia, Hypertension, Iron deficiency anemia, Irregular menses, MI (myocardial infarction) (HCC) (05/2006), Migraine, N&V (nausea and vomiting), New onset of congestive heart failure (HCC) (05/16/2022), Obesity, OSA (obstructive sleep apnea), Peripheral neuropathy, Pneumonia, Recurrent boils, Seasonal asthma, Substance abuse (HCC), and Thrombocytosis.   Surgical History:   Past Surgical History:  Procedure Laterality Date   CESAREAN SECTION  1997   CORONARY ANGIOPLASTY WITH STENT PLACEMENT  2008   "2 stents"   ESOPHAGOGASTRODUODENOSCOPY N/A 04/23/2014   Procedure: ESOPHAGOGASTRODUODENOSCOPY (EGD);  Surgeon: Vertell Novak., MD;  Location: [facility] ENDOSCOPY;  Service: Endoscopy;  Laterality: N/A;   INCISION AND DRAINAGE ABSCESS Right 04/17/2021   Procedure: INCISION AND DRAINAGE CHEST WALL ABSCESS;  Surgeon: Diamantina Monks, MD;  Location: [facility] OR;  Service: General;  Laterality: Right;   IR ANGIOGRAM PELVIS SELECTIVE OR SUPRASELECTIVE  12/07/2016   IR ANGIOGRAM PELVIS SELECTIVE OR SUPRASELECTIVE  12/07/2016   IR ANGIOGRAM SELECTIVE EACH ADDITIONAL VESSEL  12/07/2016   IR ANGIOGRAM SELECTIVE EACH ADDITIONAL VESSEL  12/07/2016   IR EMBO ARTERIAL NOT HEMORR HEMANG INC GUIDE ROADMAPPING  12/07/2016   IR RADIOLOGIST EVAL & MGMT  01/09/2017   IR US GUIDE VASC ACCESS RIGHT  12/07/2016   IRRIGATION AND DEBRIDEMENT FOOT Right 04/07/2020   Procedure: Incision and Drainage Right Foot;  Surgeon: Park Liter, DPM;  Location: [facility] ORS;  Service: Podiatry;  Laterality: Right;   RIGHT HEART CATH N/A 08/14/2022   Procedure: RIGHT HEART CATH;  Surgeon: Runell Gess, MD;  Location: [facility] INVASIVE CV LAB;  Service: Cardiovascular;   Laterality: N/A;     Social History:   reports that she quit smoking about 26 years ago. Her smoking use included cigarettes. She has never used smokeless tobacco. She reports that she does not currently use drugs after having used the following drugs: Marijuana and Cocaine. She reports that she does not drink alcohol.   Family History:  Her family history includes Diabetes in her father; Healthy in her mother.   Allergies Allergies  Allergen Reactions   Morphine And Codeine Itching and Swelling   Zestril [Lisinopril] Nausea Only and Swelling     Home Medications  Prior to Admission medications   Medication Sig Start Date End Date Taking? Authorizing Provider  albuterol (VENTOLIN HFA) 108 (90 Base) MCG/ACT inhaler Inhale 2 puffs into the lungs every 4 (four) hours as needed for wheezing or shortness of breath. 11/30/19   Zannie Cove, MD  amLODipine (NORVASC) 10 MG tablet Take 1 tablet (10 mg total) by mouth daily. 08/28/22 09/27/22  Robbie Lis M, PA-C  apixaban (ELIQUIS) 5 MG TABS tablet Take 1 tablet (5 mg total) by mouth 2 (two) times daily. 08/28/22   Allayne Butcher, PA-C  aspirin 81 MG chewable tablet Chew 81 mg by mouth daily.    [provider]  atorvastatin (LIPITOR) 40 MG tablet Take 1 tablet (40 mg total) by mouth daily. 08/28/22 09/27/22  Robbie Lis M, PA-C  Blood Glucose Monitoring Suppl (ONETOUCH VERIO FLEX SYSTEM) w/Device KIT Use in the morning, at noon, and at bedtime. 05/23/22   Arrien, York Ram, MD  carvedilol (COREG) 25 MG tablet Take 1 tablet (25 mg total) by mouth 2 (two) times daily with a meal. 08/28/22 09/27/22  Robbie Lis M, PA-C  cyclobenzaprine (FLEXERIL) 10 MG tablet Take 10 mg by mouth 3 (three) times daily as needed for muscle spasms.    [provider]  dicyclomine (BENTYL) 10 MG capsule Take 1 capsule (10 mg total) by mouth 4 (four) times daily -  before meals and at bedtime. 08/20/21   Elson Areas, PA-C   diphenhydrAMINE (BENADRYL) 25 MG tablet Take 25 mg by mouth every 6 (six) hours as needed for allergies.    [provider]  famotidine (PEPCID) 20 MG tablet Take 20 mg by mouth at bedtime. 04/11/21   [provider]  ferrous sulfate 325 (65 FE) MG tablet Take 325 mg by mouth daily with breakfast.    [provider]  furosemide (LASIX) 20 MG tablet TAKE 3 TABLETS(60 MG) BY MOUTH TWICE DAILY 11/22/22   Bensimhon, Bevelyn Buckles, MD  glucose blood (ONETOUCH VERIO) test strip Use as instructed to check blood sugar 3 times daily. Dx codes: E11.8, E11.65 2018/12/10   Hoy Register, MD  hydrALAZINE (APRESOLINE) 100 MG tablet Take 1 tablet (100 mg total) by mouth every 8 (eight) hours. 08/28/22   Robbie Lis M, PA-C  Insulin Pen Needle (PEN NEEDLES 3/16") 31G X 5 MM MISC 1 Device by Does not apply route 4 (four) times daily - after meals and at bedtime. 11/28/18   Grayce Sessions, NP  INSULIN SYRINGE .5CC/28G (INS SYRINGE/NEEDLE .5CC/28G) 28G X 1/2" 0.5 ML MISC Please provide 1 month supply 09/04/17   Scherrie Gerlach, MD  isosorbide mononitrate (IMDUR) 30 MG 24 hr tablet Take 3 tablets (90 mg total) by mouth daily. 08/28/22 09/27/22  Allayne Butcher, PA-C  linaclotide (LINZESS) 145 MCG CAPS capsule Take 145 mcg by mouth daily as needed (for constipation).  [provider]  meclizine (ANTIVERT) 25 MG tablet Take 1 tablet (25 mg total) by mouth 3 (three) times daily as needed for dizziness. 04/26/21   Glade Lloyd, MD  naproxen (NAPROSYN) 375 MG tablet Take 1 tablet (375 mg total) by mouth 2 (two) times daily. 12/28/22   Kommor, Madison, MD  omeprazole (PRILOSEC) 40 MG capsule Take 40 mg by mouth daily before breakfast. 03/10/21   [provider]  OneTouch Delica Lancets 33G MISC Use to check blood sugar 3 times daily. Dx codes: E11.8, E11.65 12/06/2018   Hoy Register, MD  oxyCODONE-acetaminophen (PERCOCET) 10-325 MG tablet Take 1 tablet by mouth 3 (three) times  daily.    [provider]  promethazine-dextromethorphan (PROMETHAZINE-DM) 6.25-15 MG/5ML syrup Take 2.5 mLs by mouth 4 (four) times daily as needed for cough. 12/28/22   Kommor, Madison, MD  senna (SENOKOT) 8.6 MG TABS tablet Take 1 tablet (8.6 mg total) by mouth 2 (two) times daily. 04/26/21   Glade Lloyd, MD     Critical care time: 50 mins       Posey Boyer, MSN, AG-ACNP-BC [facility] 04/16/2023, 4:31 PM  See Amion for pager If no response to pager , please call [phone] until 7pm After 7:00 pm call Elink  [phone]

## 2023-04-16 NOTE — Progress Notes (Signed)
PHARMACY - ANTICOAGULATION CONSULT NOTE  Pharmacy Consult for heparin Indication: VTE   Allergies  Allergen Reactions   Morphine And Codeine Itching and Swelling   Zestril [Lisinopril] Nausea Only and Swelling    Patient Measurements: Height: 5\' 7"  (170.2 cm) Weight: 89.4 kg (197 lb 1.5 oz) (Wt from 08/2022) IBW/kg (Calculated) : 61.6 Heparin Dosing Weight: 80.7kg  Vital Signs: Temp: 99.4 F (37.4 C) (12/23 0755) Temp Source: Oral (12/23 0755) BP: 135/98 (12/23 0815) Pulse Rate: 117 (12/23 0815)  Labs: Recent Labs    04/15/23 2122 04/15/23 2327 04/16/23 0800  HGB 9.4*  --  10.5*  HCT 28.1*  --  31.0*  PLT 390  --  411*  APTT  --  25  --   HEPARINUNFRC  --  <0.10* 0.19*  CREATININE 4.51*  --   --   TROPONINIHS 61* 59*  --     Estimated Creatinine Clearance: 17.3 mL/min (A) (by C-G formula based on SCr of 4.51 mg/dL (H)).  Assessment: 67 yoF presented to ED with chest pain and shortness of breath. PMH includes: HFrEF (45-50%), CKD Stage 4, CAD s/p PCI, stroke, T2DM, h/o DVT/PE (h/o eliquis >> patient states no longer taking), baseline Anti-Xa level undetectable.    Initial heparin level subtherapeutic on 1300 units/hr, pending VQ scan results  Goal of Therapy:  Heparin level 0.3-0.7 units/ml Monitor platelets by anticoagulation protocol: Yes   Plan:  Increase heparin gtt to 1500 units/hr F/u 8 hour heparin level F/u VQ scan results and long term [facility] plan  Daylene Posey, PharmD, [facility] Clinical Pharmacist ED Pharmacist Phone # [phone] 04/16/2023 9:30 AM

## 2023-04-17 ENCOUNTER — Encounter: Payer: 59 | Admitting: Vascular Surgery

## 2023-04-17 ENCOUNTER — Other Ambulatory Visit (HOSPITAL_COMMUNITY): Payer: 59

## 2023-04-17 ENCOUNTER — Inpatient Hospital Stay (HOSPITAL_COMMUNITY): Payer: 59

## 2023-04-17 ENCOUNTER — Encounter (HOSPITAL_COMMUNITY): Payer: 59

## 2023-04-17 DIAGNOSIS — Z86711 Personal history of pulmonary embolism: Secondary | ICD-10-CM

## 2023-04-17 DIAGNOSIS — J101 Influenza due to other identified influenza virus with other respiratory manifestations: Secondary | ICD-10-CM | POA: Diagnosis not present

## 2023-04-17 LAB — BASIC METABOLIC PANEL
Anion gap: 14 (ref 5–15)
BUN: 58 mg/dL — ABNORMAL HIGH (ref 6–20)
CO2: 13 mmol/L — ABNORMAL LOW (ref 22–32)
Calcium: 8.5 mg/dL — ABNORMAL LOW (ref 8.9–10.3)
Chloride: 113 mmol/L — ABNORMAL HIGH (ref 98–111)
Creatinine, Ser: 6.51 mg/dL — ABNORMAL HIGH (ref 0.44–1.00)
GFR, Estimated: 7 mL/min — ABNORMAL LOW (ref >60)
Glucose, Bld: 142 mg/dL — ABNORMAL HIGH (ref 70–99)
Potassium: 4.6 mmol/L (ref 3.5–5.1)
Sodium: 140 mmol/L (ref 135–145)

## 2023-04-17 LAB — CBC
HCT: 25.5 % — ABNORMAL LOW (ref 36.0–46.0)
Hemoglobin: 8.6 g/dL — ABNORMAL LOW (ref 12.0–15.0)
MCH: 26.8 pg (ref 26.0–34.0)
MCHC: 33.7 g/dL (ref 30.0–36.0)
MCV: 79.4 fL — ABNORMAL LOW (ref 80.0–100.0)
Platelets: 335 10*3/uL (ref 150–400)
RBC: 3.21 MIL/uL — ABNORMAL LOW (ref 3.87–5.11)
RDW: 16.3 % — ABNORMAL HIGH (ref 11.5–15.5)
WBC: 11.1 10*3/uL — ABNORMAL HIGH (ref 4.0–10.5)
nRBC: 0 % (ref 0.0–0.2)

## 2023-04-17 LAB — GLUCOSE, CAPILLARY
Glucose-Capillary: 126 mg/dL — ABNORMAL HIGH (ref 70–99)
Glucose-Capillary: 142 mg/dL — ABNORMAL HIGH (ref 70–99)
Glucose-Capillary: 141 mg/dL — ABNORMAL HIGH (ref 70–99)
Glucose-Capillary: 114 mg/dL — ABNORMAL HIGH (ref 70–99)
Glucose-Capillary: 105 mg/dL — ABNORMAL HIGH (ref 70–99)
Glucose-Capillary: 120 mg/dL — ABNORMAL HIGH (ref 70–99)

## 2023-04-17 LAB — CK: Total CK: 2213 U/L — ABNORMAL HIGH (ref 38–234)

## 2023-04-17 MED ORDER — LABETALOL HCL 5 MG/ML IV SOLN: 10.0000 mg | INTRAVENOUS | Status: DC | PRN

## 2023-04-17 MED ORDER — ACETAMINOPHEN 325 MG PO TABS
650.0000 mg | ORAL_TABLET | Freq: Four times a day (QID) | ORAL | Status: DC | PRN
  Administered 2023-04-17 – 2023-04-25 (×2): 650 mg via ORAL
  Filled 2023-04-17 (×4): qty 2

## 2023-04-17 MED ORDER — SODIUM BICARBONATE 650 MG PO TABS
650.0000 mg | ORAL_TABLET | Freq: Every day | ORAL | Status: DC
  Administered 2023-04-17 – 2023-04-20 (×4): 650 mg via ORAL
  Filled 2023-04-17 (×4): qty 1

## 2023-04-17 MED ORDER — SODIUM CHLORIDE 0.9% FLUSH: 3.0000 mL | INTRAVENOUS | Status: DC | PRN

## 2023-04-17 MED ORDER — AMLODIPINE BESYLATE 10 MG PO TABS
10.0000 mg | ORAL_TABLET | Freq: Every day | ORAL | Status: DC
  Administered 2023-04-17 – 2023-04-21 (×5): 10 mg via ORAL
  Filled 2023-04-17 (×5): qty 1

## 2023-04-17 MED ORDER — LABETALOL HCL 5 MG/ML IV SOLN
5.0000 mg | INTRAVENOUS | Status: DC | PRN
  Administered 2023-04-17: 5 mg via INTRAVENOUS
  Filled 2023-04-17: qty 4

## 2023-04-17 MED ORDER — LACTATED RINGERS IV BOLUS
1000.0000 mL | Freq: Once | INTRAVENOUS | Status: AC
  Administered 2023-04-17: 1000 mL via INTRAVENOUS

## 2023-04-17 MED ORDER — HYDRALAZINE HCL 20 MG/ML IJ SOLN: 10.0000 mg | Freq: Four times a day (QID) | INTRAMUSCULAR | Status: DC | PRN

## 2023-04-17 MED ORDER — LABETALOL HCL 5 MG/ML IV SOLN: 5.0000 mg | INTRAVENOUS | Status: DC | PRN

## 2023-04-17 MED ORDER — HYDRALAZINE HCL 50 MG PO TABS
100.0000 mg | ORAL_TABLET | Freq: Three times a day (TID) | ORAL | Status: DC
  Administered 2023-04-17 – 2023-04-26 (×26): 100 mg via ORAL
  Filled 2023-04-17 (×29): qty 2

## 2023-04-17 MED ORDER — HYDRALAZINE HCL 50 MG PO TABS: 100.0000 mg | ORAL_TABLET | Freq: Three times a day (TID) | ORAL | Status: DC

## 2023-04-17 MED ORDER — HEPARIN SODIUM (PORCINE) 5000 UNIT/ML IJ SOLN
5000.0000 [IU] | Freq: Three times a day (TID) | INTRAMUSCULAR | Status: DC
  Administered 2023-04-17 – 2023-04-18 (×4): 5000 [IU] via SUBCUTANEOUS
  Filled 2023-04-17 (×4): qty 1

## 2023-04-17 MED ORDER — CARVEDILOL 25 MG PO TABS
25.0000 mg | ORAL_TABLET | Freq: Two times a day (BID) | ORAL | Status: DC
  Administered 2023-04-18 – 2023-04-26 (×16): 25 mg via ORAL
  Filled 2023-04-17 (×17): qty 1

## 2023-04-17 MED ORDER — ISOSORBIDE MONONITRATE ER 60 MG PO TB24: 90.0000 mg | ORAL_TABLET | Freq: Every day | ORAL | Status: DC

## 2023-04-17 MED ORDER — SODIUM CHLORIDE 0.9% FLUSH
3.0000 mL | Freq: Two times a day (BID) | INTRAVENOUS | Status: DC
  Administered 2023-04-17 (×2): 10 mL via INTRAVENOUS
  Administered 2023-04-18 – 2023-04-19 (×2): 3 mL via INTRAVENOUS
  Administered 2023-04-19 – 2023-04-26 (×13): 10 mL via INTRAVENOUS

## 2023-04-17 NOTE — Progress Notes (Signed)
Patient was transferred on the floor at 1400 via bed. Alert. On RA.  Connected to the cardiac monitor box 16. Will continue to monitor.

## 2023-04-17 NOTE — Progress Notes (Addendum)
NAME:  Shirley [name], MRN:  [id number], DOB:  1975-10-03, LOS: 2 ADMISSION DATE:  04/15/2023, CONSULTATION DATE:  04/16/23 REFERRING MD:  TRH, CHIEF COMPLAINT:  AMS, hypotension   History of Present Illness:  Pt encephalopathic, therefore HPI obtained from EMR review.  32 yoF with PMH of HFrEF (02/2022 EF 45-50%, PASP 35.9, normal RVSF, mild to mod TR), CKD4 (baseline sCr 2.7- 3.8), PE not on AC, DMT2, HTN, GERD, chronic pain, and IDA who was admitted to [facility] on 12/22 for atypical chest pain, suspected pleuritic, Flu A positive, sepsis, and AKI.  Presented for worsening SOB and right sided CP after completing one week of azithromycin.  Workup also notable for CXR without acute process.  EKG has been non acute, BNP 1700,  and trop hs 61> 59 but chronically elevated in the 30-40's, PCT 0.12.  Was started on heparin given hx of PE, taken off AC apparently months ago, with chest pain with AKI/ CKD limiting CTA.  Underwent VQ scan 12/23 am which did not show evidence of perfusion defect concerning for PE therefore heparin stopped.  Was normotensive and received her home antihypertensives along with her oxycodone.  Once arrival to floor, pt noted to be acutely encephalopathic and hypotensive.  RRT initiated.  Glucose 100.  Half dose narcan given without significant response.  NS bolus initiated with some response in BP but remains borderline.  PCCM called for ongoing encephalopathy and concern for airway protection.    Pertinent  Medical History  HFrEF, CKD4 (baseline sCr 2.7- 3.8), PE not on AC, DMT2, HTN, GERD, IDA  Significant Hospital Events: Including procedures, antibiotic start and stop dates in addition to other pertinent events     Interim History / Subjective:  No overnight issues. Oliguric  Objective   Blood pressure 127/72, pulse (!) 107, temperature 100.1 F (37.8 C), temperature source Axillary, resp. rate 20, height 5\' 7"  (1.702 m), weight 103.4 kg, last menstrual period 01/22/2021,  SpO2 94%.        Intake/Output Summary (Last 24 hours) at 04/17/2023 1013 Last data filed at 04/17/2023 0300 Gross per 24 hour  Intake 1712.62 ml  Output 0 ml  Net 1712.62 ml   Filed Weights   04/15/23 2300 04/16/23 1701 04/17/23 0500  Weight: 89.4 kg 103.4 kg 103.4 kg    Examination: Well nourished woman, no respiratory distress Lethargic but arousable to voice and physical touch Follows commands, Aox4.  Tachycardic, regular On room air, breathing non labored No lower extremity edema No abdominal tenderness   Resolved Hospital Problem list    Assessment & Plan:   Severe sepsis secondary to Influenza A infection - did receive her home norvasc 10mg , coreg 25mg , this am and most recently hydralazine 100mg  at 1346.  - completing 1.5L bolus, fluid responsive thus far.  Questionable if narcan helped.  Would avoid further fluids with hx of  - renally dosed tamiflu - tylenol for low grade fevers  Acute encephalopathy  - likely secondary to above, with component of CKD - CT Head reviewed, no acute abnormality - she is Aox4 and fatigued but arousable, no focal deficits  Hypotension HFrEF -(02/2022 EF 45-50%, PASP 35.9, normal RVSF, mild to mod TR) - goal MAP > 65,  - suspect component of volume depletion given clinical history - given oliguria will give some gentle IVF today - holding home antihypertensives  Oliguric AoCKD stage 4 NAMA Rhabdomylosis - baseline sCr 2.7- 3.8, FENa high but is on home diurectics - repeat BMET w/sCr  now 5.26 with no significant electrolyte derangements - check CK as it was elevated yesterday - strict I/Os, monitor for urinary retention - daily BMET, if worsening will need [place], nephrology consult - will add oral bicarb today - eventually needs AVF placement for HD - IVF as above  Chest pain, suspected pleuritic Hx PE not currently on [facility] - EKG non acute on admit, hx chronically elevated trop hs - VQ scan today negative for PE.  - will  check LE dopplers - will hold on AC until this is resulted  GERD - PPI IV for now  IDA - hold ferrous sulfate  - trend CBC, H/H stable  HLD - hold statin while NPO   Best Practice (right click and "Reselect all SmartList Selections" daily)   Diet/type: NPO advance as tolerated. Ok for meds DVT prophylaxis prophylactic heparin  Pressure ulcer(s): N/A GI prophylaxis: PPI Lines: N/A Foley:  N/A Code Status:  full code Last date of multidisciplinary goals of care discussion Rayford Halsted updated at bedside.]  I spent 50 minutes in total visit time for this patient, with more than 50% spent counseling/coordinating care. Ok for transfer out of ICU. TRH to assume care 12/25.   Durel Salts, MD Pulmonary and Critical Care Medicine [facility] 04/17/2023 10:14 AM Pager: see AMION  If no response to pager, please call critical care on call (see AMION) until 7pm After 7:00 pm call Elink

## 2023-04-18 ENCOUNTER — Inpatient Hospital Stay (HOSPITAL_COMMUNITY): Payer: 59

## 2023-04-18 DIAGNOSIS — R0789 Other chest pain: Secondary | ICD-10-CM | POA: Diagnosis not present

## 2023-04-18 LAB — RENAL FUNCTION PANEL
Albumin: 2.3 g/dL — ABNORMAL LOW (ref 3.5–5.0)
Anion gap: 9 (ref 5–15)
BUN: 75 mg/dL — ABNORMAL HIGH (ref 6–20)
CO2: 16 mmol/L — ABNORMAL LOW (ref 22–32)
Calcium: 8.2 mg/dL — ABNORMAL LOW (ref 8.9–10.3)
Chloride: 112 mmol/L — ABNORMAL HIGH (ref 98–111)
Creatinine, Ser: 7.79 mg/dL — ABNORMAL HIGH (ref 0.44–1.00)
GFR, Estimated: 6 mL/min — ABNORMAL LOW (ref >60)
Glucose, Bld: 118 mg/dL — ABNORMAL HIGH (ref 70–99)
Phosphorus: 7.5 mg/dL — ABNORMAL HIGH (ref 2.5–4.6)
Potassium: 4.5 mmol/L (ref 3.5–5.1)
Sodium: 137 mmol/L (ref 135–145)

## 2023-04-18 LAB — IRON AND TIBC
Iron: 35 ug/dL (ref 28–170)
Saturation Ratios: 14 % (ref 10.4–31.8)
TIBC: 251 ug/dL (ref 250–450)
UIBC: 216 ug/dL

## 2023-04-18 LAB — GLUCOSE, CAPILLARY
Glucose-Capillary: 103 mg/dL — ABNORMAL HIGH (ref 70–99)
Glucose-Capillary: 111 mg/dL — ABNORMAL HIGH (ref 70–99)
Glucose-Capillary: 116 mg/dL — ABNORMAL HIGH (ref 70–99)
Glucose-Capillary: 128 mg/dL — ABNORMAL HIGH (ref 70–99)
Glucose-Capillary: 129 mg/dL — ABNORMAL HIGH (ref 70–99)
Glucose-Capillary: 171 mg/dL — ABNORMAL HIGH (ref 70–99)
Glucose-Capillary: 183 mg/dL — ABNORMAL HIGH (ref 70–99)

## 2023-04-18 LAB — CK: Total CK: 1657 U/L — ABNORMAL HIGH (ref 38–234)

## 2023-04-18 LAB — FERRITIN: Ferritin: 374 ng/mL — ABNORMAL HIGH (ref 11–307)

## 2023-04-18 LAB — HEPARIN LEVEL (UNFRACTIONATED): Heparin Unfractionated: 0.85 [IU]/mL — ABNORMAL HIGH (ref 0.30–0.70)

## 2023-04-18 MED ORDER — HEPARIN BOLUS VIA INFUSION
2500.0000 [IU] | Freq: Once | INTRAVENOUS | Status: AC
Start: 2023-04-18 — End: 2023-04-18
  Administered 2023-04-18: 2500 [IU] via INTRAVENOUS
  Filled 2023-04-18: qty 2500

## 2023-04-18 MED ORDER — HEPARIN (PORCINE) 25000 UT/250ML-% IV SOLN
950.0000 [IU]/h | INTRAVENOUS | Status: DC
  Administered 2023-04-18: 1300 [IU]/h via INTRAVENOUS
  Filled 2023-04-18: qty 250

## 2023-04-18 MED ORDER — CHLORHEXIDINE GLUCONATE CLOTH 2 % EX PADS
6.0000 | MEDICATED_PAD | Freq: Every day | CUTANEOUS | Status: DC
  Administered 2023-04-19 – 2023-04-25 (×6): 6 via TOPICAL

## 2023-04-18 MED ORDER — FUROSEMIDE 10 MG/ML IJ SOLN
80.0000 mg | Freq: Once | INTRAMUSCULAR | Status: AC
  Administered 2023-04-18: 80 mg via INTRAVENOUS
  Filled 2023-04-18: qty 8

## 2023-04-18 NOTE — Progress Notes (Signed)
PROGRESS NOTE    Shirley [name]  MVH:[id number] DOB: Apr 16, 1976 DOA: 04/15/2023 PCP: Bettey Costa, NP   Brief Narrative: Shirley [name] is a 47 y.o. female with a history of CKD stage IV, hypertension, HFmrEF, CAD s/p PCI, stroke, OSA, recurrent VTE.  Patient presented secondary to flu-like symptoms, pleuritic chest pain and chest heaviness found to have influenza A infection. Patient started on Tamiflu for treatment. Hospitalization complicated by acute metabolic encephalopathy in addition to hypotension, requiring a one day admission to ICU. Although encephalopathy and hypotension improved, patient developed worsening AKI on underlying renal disease in addition to anuria/oliguria. Nephrology consulted.   Assessment and Plan:  Oliguric AKI on CKD stage IV Baseline creatinine of 2.8. Creatinine of 4.51 on admission. Although patient has no urine output, bladder scan shows 181 mL. BUN and creatinine continue to trend upward. BUN/creatinine of 75/7.79 today, respectively. -Nephrology consult  Metabolic acidosis Secondary to renal disease. -Continue sodium bicarbonate  Influenza A infection -Continue Tamiflu  Acute metabolic encephalopathy Unclear etiology but presumed secondary to sepsis and complicated by underlying renal disease. Overall, encephalopathy is improved from prior documentation.  Severe sepsis Secondary to influenza infection. Present on admission. Managed via treatment of underlying infection. Blood cultures obtained and are no growth to date.  Hypotension Concerned this was secondary to volume depletion and was give IV fluids while in ICU. Hypotension resolved.  HFrEF LVEF of 45-50% from 02/2022. Patient with evidence of bilateral lower extremity edema. Concerning patient may be developing fluid overload secondary to worsening renal function.  Chest pain Chest x-ray imaging clear. D-dimer elevated at 1.95. V/Q scan obtained and was negative for acute  PE.  Chronic DVT Lower extremity venous duplex on 12/24 revealed a right popliteal vein chronic DVT. -Heparin IV  Diabetes mellitus type 2 Well controlled with hemoglobin A1C of 6.5%. -Continue SSI  Gastroparesis Noted history.  CAD Angina Patient is on Imdur, Lipitor and aspirin as an outpatient which were held.  Primary hypertension -Continue amlodipine and Coreg  History of CVA Noted. Patient is on aspirin and Lipitor which are held.  Sleep apnea Noted.  Class II obesity Estimated body mass index is 36.36 kg/m as calculated from the following:   Height as of this encounter: 5\' 7"  (1.702 m).   Weight as of this encounter: 105.3 kg.   DVT prophylaxis: Heparin IV Code Status:   Code Status: Full Code Family Communication: None at bedside Disposition Plan: Discharge pending continued specialist recommendations and transition to outpatient medication regimen   Consultants:  PCCM Nephrology  Procedures:  LE venous duplex  Antimicrobials: Oseltamivir    Subjective: Patient reports no concerns this morning.  Objective: BP (!) 153/72 (BP Location: Right Arm)   Pulse (!) 103   Temp 97.8 F (36.6 C)   Resp 18   Ht 5\' 7"  (1.702 m)   Wt 105.3 kg   LMP 01/22/2021 Comment: neg preg test 04/17/21  SpO2 95%   BMI 36.36 kg/m   Examination:  General exam: Appears calm and comfortable Respiratory system: Clear to auscultation. Respiratory effort normal. Cardiovascular system: S1 & S2 heard, RRR. Gastrointestinal system: Abdomen is nondistended, soft and nontender. Normal bowel sounds heard. Central nervous system: Lethargic but eventually arouses with persistent agitation. Musculoskeletal: BLE edema. No calf tenderness   Data Reviewed: I have personally reviewed following labs and imaging studies  CBC Lab Results  Component Value Date   WBC 11.1 (H) 04/17/2023   RBC 3.21 (L) 04/17/2023   HGB  8.6 (L) 04/17/2023   HCT 25.5 (L) 04/17/2023   MCV 79.4  (L) 04/17/2023   MCH 26.8 04/17/2023   PLT 335 04/17/2023   MCHC 33.7 04/17/2023   RDW 16.3 (H) 04/17/2023   LYMPHSABS 0.6 (L) 04/16/2023   MONOABS 0.8 04/16/2023   EOSABS 0.0 04/16/2023   BASOSABS 0.0 04/16/2023     Last metabolic panel Lab Results  Component Value Date   NA 140 04/17/2023   K 4.6 04/17/2023   CL 113 (H) 04/17/2023   CO2 13 (L) 04/17/2023   BUN 58 (H) 04/17/2023   CREATININE 6.51 (H) 04/17/2023   GLUCOSE 142 (H) 04/17/2023   GFRNONAA 7 (L) 04/17/2023   GFRAA 40 (L) 01/03/2020   CALCIUM 8.5 (L) 04/17/2023   PHOS 4.8 (H) 04/16/2023   PROT 6.0 (L) 04/16/2023   ALBUMIN 2.1 (L) 04/16/2023   LABGLOB 3.4 02/07/2017   AGRATIO 1.1 (L) 02/07/2017   BILITOT 0.6 04/16/2023   ALKPHOS 53 04/16/2023   AST 26 04/16/2023   ALT 11 04/16/2023   ANIONGAP 14 04/17/2023    GFR: Estimated Creatinine Clearance: 13.1 mL/min (A) (by C-G formula based on SCr of 6.51 mg/dL (H)).  Recent Results (from the past 240 hours)  Resp panel by RT-PCR (RSV, Flu A&B, Covid) Anterior Nasal Swab     Status: Abnormal   Collection Time: 04/15/23  9:23 PM   Specimen: Anterior Nasal Swab  Result Value Ref Range Status   SARS Coronavirus 2 by RT PCR NEGATIVE NEGATIVE Final   Influenza A by PCR POSITIVE (A) NEGATIVE Final   Influenza B by PCR NEGATIVE NEGATIVE Final    Comment: (NOTE) The Xpert Xpress SARS-CoV-2/FLU/RSV plus assay is intended as an aid in the diagnosis of influenza from Nasopharyngeal swab specimens and should not be used as a sole basis for treatment. Nasal washings and aspirates are unacceptable for Xpert Xpress SARS-CoV-2/FLU/RSV testing.  Fact Sheet for Patients: BloggerCourse.com  Fact Sheet for Healthcare Providers: SeriousBroker.it  This test is not yet approved or cleared by the [place] FDA and has been authorized for detection and/or diagnosis of SARS-CoV-2 by FDA under an Emergency Use Authorization  (EUA). This EUA will remain in effect (meaning this test can be used) for the duration of the COVID-19 declaration under Section 564(b)(1) of the Act, 21 U.S.C. section 360bbb-3(b)(1), unless the authorization is terminated or revoked.     Resp Syncytial Virus by PCR NEGATIVE NEGATIVE Final    Comment: (NOTE) Fact Sheet for Patients: BloggerCourse.com  Fact Sheet for Healthcare Providers: SeriousBroker.it  This test is not yet approved or cleared by the [place] FDA and has been authorized for detection and/or diagnosis of SARS-CoV-2 by FDA under an Emergency Use Authorization (EUA). This EUA will remain in effect (meaning this test can be used) for the duration of the COVID-19 declaration under Section 564(b)(1) of the Act, 21 U.S.C. section 360bbb-3(b)(1), unless the authorization is terminated or revoked.  Performed at [facility], [address] [place], [place] [address]   Culture, blood (Routine X 2) w Reflex to ID Panel     Status: None (Preliminary result)   Collection Time: 04/16/23  2:16 AM   Specimen: BLOOD  Result Value Ref Range Status   Specimen Description BLOOD SITE NOT SPECIFIED  Final   Special Requests   Final    BOTTLES DRAWN AEROBIC AND ANAEROBIC Blood Culture adequate volume   Culture   Final    NO GROWTH 2 DAYS Performed  at [facility], [address], [place], [place] [address]    Report Status PENDING  Incomplete  Culture, blood (Routine X 2) w Reflex to ID Panel     Status: None (Preliminary result)   Collection Time: 04/16/23  2:26 AM   Specimen: BLOOD  Result Value Ref Range Status   Specimen Description BLOOD SITE NOT SPECIFIED  Final   Special Requests   Final    BOTTLES DRAWN AEROBIC AND ANAEROBIC Blood Culture results may not be optimal due to an inadequate volume of blood received in culture bottles   Culture   Final    NO GROWTH 2 DAYS Performed at [facility]  Lab, [address], [place], [place] [address]    Report Status PENDING  Incomplete  MRSA Next Gen by PCR, Nasal     Status: None   Collection Time: 04/16/23  5:07 PM   Specimen: Nasal Mucosa; Nasal Swab  Result Value Ref Range Status   MRSA by PCR Next Gen NOT DETECTED NOT DETECTED Final    Comment: (NOTE) The GeneXpert MRSA Assay (FDA approved for NASAL specimens only), is one component of a comprehensive MRSA colonization surveillance program. It is not intended to diagnose MRSA infection nor to guide or monitor treatment for MRSA infections. Test performance is not FDA approved in patients less than 55 years old. Performed at [facility], [address], [place], [place] [address]       Radiology Studies: VAS [place] LOWER EXTREMITY VENOUS (DVT) Result Date: 04/17/2023  Lower Venous DVT Study Patient Name:  CALIROSE DUBUISSON Haggard  Date of Exam:   04/17/2023 Medical Rec #: [id number]         Accession #:    [id number] Date of Birth: 1976-01-22         Patient Gender: F Patient Age:   54 years Exam Location:  [facility] Procedure:      VAS [place] LOWER EXTREMITY VENOUS (DVT) Referring Phys: Gunnar Fusi SIMPSON --------------------------------------------------------------------------------  Indications: Pulmonary embolism.  Risk Factors: Confirmed PE. Comparison Study: Previous study on 11.29.2023. Performing Technologist: Fernande Bras  Examination Guidelines: A complete evaluation includes B-mode imaging, spectral Doppler, color Doppler, and power Doppler as needed of all accessible portions of each vessel. Bilateral testing is considered an integral part of a complete examination. Limited examinations for reoccurring indications may be performed as noted. The reflux portion of the exam is performed with the patient in reverse Trendelenburg.  +---------+---------------+---------+-----------+----------+-----------------+ RIGHT    CompressibilityPhasicitySpontaneityPropertiesThrombus Aging     +---------+---------------+---------+-----------+----------+-----------------+ CFV      Full           Yes      Yes                                    +---------+---------------+---------+-----------+----------+-----------------+ SFJ      Full           Yes      Yes                                    +---------+---------------+---------+-----------+----------+-----------------+ FV Prox  Partial                                                        +---------+---------------+---------+-----------+----------+-----------------+  FV Mid   Full                                                           +---------+---------------+---------+-----------+----------+-----------------+ FV DistalFull                                                           +---------+---------------+---------+-----------+----------+-----------------+ PFV      Full                                                           +---------+---------------+---------+-----------+----------+-----------------+ POP      Partial        Yes      Yes                  Fibrin stranding. +---------+---------------+---------+-----------+----------+-----------------+ PTV      Full                                                           +---------+---------------+---------+-----------+----------+-----------------+ PERO     Full                                                           +---------+---------------+---------+-----------+----------+-----------------+   +---------+---------------+---------+-----------+----------+--------------+ LEFT     CompressibilityPhasicitySpontaneityPropertiesThrombus Aging +---------+---------------+---------+-----------+----------+--------------+ CFV      Full           Yes      Yes                                 +---------+---------------+---------+-----------+----------+--------------+ SFJ      Full           Yes      Yes                                  +---------+---------------+---------+-----------+----------+--------------+ FV Prox  Full                                                        +---------+---------------+---------+-----------+----------+--------------+ FV Mid   Full                                                        +---------+---------------+---------+-----------+----------+--------------+  FV DistalFull                                                        +---------+---------------+---------+-----------+----------+--------------+ PFV      Full                                                        +---------+---------------+---------+-----------+----------+--------------+ POP      Full           Yes      Yes                                 +---------+---------------+---------+-----------+----------+--------------+ PTV      Full                                                        +---------+---------------+---------+-----------+----------+--------------+ PERO     Full                                                        +---------+---------------+---------+-----------+----------+--------------+    Summary: RIGHT: - Findings consistent with 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  - No cystic structure found in the popliteal fossa.  LEFT: - There is no evidence of deep vein thrombosis in the lower extremity.  - No cystic structure found in the popliteal fossa.  *See table(s) above for measurements and observations.    Preliminary    CT HEAD WO CONTRAST ( ) Result Date: 04/16/2023 CLINICAL DATA:  Mental status change, unknown cause EXAM: CT HEAD WITHOUT CONTRAST TECHNIQUE: Contiguous axial images were obtained from the base of the skull through the vertex without intravenous contrast. RADIATION DOSE REDUCTION: This exam was performed according to the departmental dose-optimization program which includes automated exposure control, adjustment of the  mA and/or kV according to patient size and/or use of iterative reconstruction technique. COMPARISON:  CT head 01/09/2016. FINDINGS: Brain: Remote right cerebellar infarct. Small remote right caudate lacunar infarct. No evidence of acute large vascular territory infarct, acute hemorrhage, mass lesion, midline shift or hydrocephalus. Vascular: No hyperdense vessel. Skull: Normal. Negative for fracture or focal lesion. Sinuses/Orbits: No acute finding. IMPRESSION: No evidence of acute intracranial abnormality. Electronically Signed   By: Feliberto Harts M.D.   On: 04/16/2023 18:25   DG CHEST PORT 1 VIEW Result Date: 04/16/2023 CLINICAL DATA:  Shock. History of diabetes, gastroesophageal reflux disease, and hypertension. EXAM: PORTABLE CHEST 1 VIEW COMPARISON:  04/15/2023 FINDINGS: Shallow inspiration. Cardiac enlargement. No vascular congestion, edema, or consolidation. No pleural effusions. No pneumothorax. Mediastinal contours appear intact. Degenerative changes in the spine. IMPRESSION: Cardiac enlargement.  No evidence of active pulmonary disease. Electronically Signed   By: Burman Nieves M.D.   On: 04/16/2023 17:21   NM Pulmonary Perfusion Result  Date: 04/16/2023 CLINICAL DATA:  Pulmonary embolism suspected, high probability. Shortness of breath. History of chronic kidney disease. EXAM: NUCLEAR MEDICINE PERFUSION LUNG SCAN TECHNIQUE: Perfusion images were obtained in multiple projections after intravenous injection of radiopharmaceutical. Ventilation scans intentionally deferred if perfusion scan and chest x-ray adequate for interpretation since COVID 19 epidemic. RADIOPHARMACEUTICALS:  4.4 mCi Tc-24m MAA IV COMPARISON:  Chest radiographs 04/15/2023 and 08/07/2022. Nuclear medicine perfusion lung scan 03/21/2022 and 11/25/2019. FINDINGS: There is improved perfusion to the superior segment of the right lower lobe compared with the previous study. Patchy perfusion to both lungs consistent with chronic  obstructive disease and small pleural effusions. There are no wedge-shaped perfusion defects to suggest pulmonary embolism. IMPRESSION: No evidence of acute pulmonary embolism on perfusion scintigraphy by PISAPED criteria. Consider correlation with lower extremity venous Doppler ultrasound. Electronically Signed   By: Carey Bullocks M.D.   On: 04/16/2023 09:34      LOS: 3 days    Jacquelin Hawking, MD [facility] 04/18/2023, 7:39 AM   If 7PM-7AM, please contact night-coverage [email or web address]

## 2023-04-18 NOTE — Consult Note (Signed)
[facility] Renal Consultation Note  Requesting MD: Jacquelin Hawking, MD Indication for Consultation:  CKD progression  Chief complaint: shortness of breath   HPI:  Shirley [name] is a 47 y.o. 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 On 12/22.  She was found to be flu A positive.  She has been started on tamiflu.  She was in shock at one point.  She has been following with the extender clinic at [facility] and previously saw Dr. Kathrene Bongo.  She has had progressive CKD and there is concern that she needs to start dialysis.  Note that she last saw [facility] on 04/09/2023 and had a GFR of 11 at that time.  She was referred for permanent access previously.  She shakes her head that she has not yet had this placed.  Creatinine is up to 7.79 today as below.  I spoke with her daughter De'Azia via phone.  We discussed the risks/benefits/indications for dialysis and she does consent to dialysis.  The patient provides limited history - history is supplemented by her daughter.  Her daughter states that they knew the time to start dialysis was coming.    Creatinine, Ser  Date/Time Value Ref Range Status  04/18/2023 10:50 AM 7.79 (H) 0.44 - 1.00 mg/dL Final  04/26/7251 66:44 AM 6.51 (H) 0.44 - 1.00 mg/dL Final  03/47/4259 56:38 PM 5.26 (H) 0.44 - 1.00 mg/dL Final  75/64/3329 51:88 AM 4.73 (H) 0.44 - 1.00 mg/dL Final  41/66/0630 16:01 PM 4.51 (H) 0.44 - 1.00 mg/dL Final  09/32/3557 32:20 PM 2.80 (H) 0.44 - 1.00 mg/dL Final  25/42/7062 37:62 PM 2.69 (H) 0.44 - 1.00 mg/dL Final  83/15/1761 60:73 AM 3.75 (H) 0.44 - 1.00 mg/dL Final  71/09/2692 85:46 AM 3.53 (H) 0.44 - 1.00 mg/dL Final  27/06/5007 38:18 AM 3.54 (H) 0.44 - 1.00  mg/dL Final  29/93/7169 67:89 AM 3.90 (H) 0.44 - 1.00 mg/dL Final  38/01/1750 02:58 AM 3.80 (H) 0.44 - 1.00 mg/dL Final  52/77/8242 35:36 AM 3.50 (H) 0.44 - 1.00 mg/dL Final  14/43/1540 08:67 AM 3.41 (H) 0.44 - 1.00 mg/dL Final  61/95/0932 67:12 AM 2.86 (H) 0.44 - 1.00 mg/dL Final  45/80/9983 38:25 PM 2.78 (H) 0.44 - 1.00 mg/dL Final  05/39/7673 41:93 PM 2.64 (H) 0.44 - 1.00 mg/dL Final  79/05/4095 35:32 AM 3.39 (H) 0.44 - 1.00 mg/dL Final  99/24/2683 41:96 AM 3.41 (H) 0.44 - 1.00 mg/dL Final  22/29/7989 21:19 AM 3.27 (H) 0.44 - 1.00 mg/dL Final  41/74/0814 48:18 AM 3.13 (H) 0.44 - 1.00 mg/dL Final  56/31/4970 26:37 AM 2.79 (H) 0.44 - 1.00 mg/dL Final  85/88/5027 74:12 AM 2.37 (H) 0.44 - 1.00 mg/dL Final  87/86/7672 09:47 PM 2.47 (H) 0.44 - 1.00 mg/dL Final  09/62/8366 29:47 AM 2.23 (H) 0.44 - 1.00 mg/dL Final  65/46/5035 46:56 AM 2.41 (H) 0.44 - 1.00 mg/dL Final  81/27/5170 01:74 AM 2.46 (H) 0.44 - 1.00 mg/dL Final  94/49/6759 16:38 PM 2.67 (H) 0.44 - 1.00 mg/dL Final  46/65/9935 70:17 AM 2.48 (H) 0.44 - 1.00 mg/dL Final  79/39/0300 92:33 PM 1.61 (H) 0.44 - 1.00 mg/dL Final  00/76/2263 33:54 AM 1.93 (H) 0.44 - 1.00 mg/dL Final  56/25/6389 37:34 AM 1.88 (H) 0.44 - 1.00 mg/dL Final  04/24/2021 02:09 AM 2.18 (H) 0.44 - 1.00 mg/dL Final  16/01/9603 54:09 AM 2.62 (H) 0.44 - 1.00 mg/dL Final  81/19/1478 29:56 AM 2.99 (H) 0.44 - 1.00 mg/dL Final  21/30/8657 84:69 AM 3.69 (H) 0.44 - 1.00 mg/dL Final  62/95/2841 32:44 AM 4.27 (H) 0.44 - 1.00 mg/dL Final  04/26/7251 66:44 AM 3.80 (H) 0.44 - 1.00 mg/dL Final  03/47/4259 56:38 AM 2.96 (H) 0.44 - 1.00 mg/dL Final    Comment:    DELTA CHECK NOTED  04/16/2021 06:50 PM 1.62 (H) 0.44 - 1.00 mg/dL Final  75/64/3329 51:88 AM 1.25 (H) 0.44 - 1.00 mg/dL Final  41/66/0630 16:01 AM 1.26 (H) 0.44 - 1.00 mg/dL Final  09/32/3557 32:20 AM 1.21 (H) 0.44 - 1.00 mg/dL Final  25/42/7062 37:62 AM 1.26 (H) 0.44 - 1.00 mg/dL Final  83/15/1761 60:73 PM 1.30 (H)  0.44 - 1.00 mg/dL Final  71/09/2692 85:46 PM 1.67 (H) 0.44 - 1.00 mg/dL Final  27/06/5007 38:18 PM 1.76 (H) 0.44 - 1.00 mg/dL Final  29/93/7169 67:89 AM 1.26 (H) 0.44 - 1.00 mg/dL Final  38/01/1750 02:58 AM 1.48 (H) 0.44 - 1.00 mg/dL Final  52/77/8242 35:36 AM 1.61 (H) 0.44 - 1.00 mg/dL Final  14/43/1540 08:67 AM 1.84 (H) 0.44 - 1.00 mg/dL Final  61/95/0932 67:12 AM 2.13 (H) 0.44 - 1.00 mg/dL Final     PMHx:   Past Medical History:  Diagnosis Date   Abscess of tunica vaginalis    10/09- Abundant S. aureus- sensitive to all abx   Anxiety    Blood dyscrasia    CAD (coronary artery disease) 06/15/2006   s/p Subendocardial MI with PDA angioplasty(no stent) on 06/15/06 and relook  cath 06/19/06 showed patency of site. Cath 12/10- no restenosis or significant CAD progression   CVA (cerebral vascular accident) (HCC) ~ 02/2014   denies residual on 04/22/2014   CVA (cerebral vascular accident) [facility])    history of remote right cerebellar infarct noted on head CT at least since 10/2011   Depression    Diabetes mellitus type 2, uncontrolled, with complications    Fibromyalgia    Gastritis    Gastroparesis    secondary to poorly controlled DM, last emptying study performed 01/2010  was normal but may be falsely positive as pt was on reglan   GERD (gastroesophageal reflux disease)    Hepatitis B, chronic (HCC)    Hep BeAb+,Hep B cAb+ & Hep BsAg+ (9/06)   History of pyelonephritis    H/o GrpB Pyelonephritis (9/06) and UTI- 07/11- E.Coli, 12/10- GBS   Hyperlipidemia    Hypertension    Iron deficiency anemia    Irregular menses    Small ovarian follicles seen on CT(9/06)   MI (myocardial infarction) (HCC) 05/2006   PDA percutaneous transluminal coronary angioplasty   Migraine    "weekly" (04/22/2014)   N&V (nausea and vomiting)    Chronic. Unclear etiology with multiple admission and ED visits. CT abdomen with and without contrast (02/2011)  showed no acute process. Gastic Emptying scan  (01/2010) was normal. Ultrasound of the abdomen was within normal limits. Hepatitis B viral load was undectable. HIV NR. EGD - gastritis, Hpylori + s/p Rx   New onset of congestive heart failure (HCC) 05/16/2022   Obesity    OSA (obstructive sleep apnea)    "suppose to wear mask but I don't" (04/22/2014)   Peripheral neuropathy    Pneumonia    "this is probably the 2nd or 3rd time I've had pneumonia" (04/22/2014)  Recurrent boils    Seasonal asthma    Substance abuse (HCC)    Thrombocytosis    Hem/Onc suggested 2/2 chronic hepatits and/or iron deficiency anemia    Past Surgical History:  Procedure Laterality Date   CESAREAN SECTION  1997   CORONARY ANGIOPLASTY WITH STENT PLACEMENT  2008   "2 stents"   ESOPHAGOGASTRODUODENOSCOPY N/A 04/23/2014   Procedure: ESOPHAGOGASTRODUODENOSCOPY (EGD);  Surgeon: Vertell Novak., MD;  Location[facility] ENDOSCOPY;  Service: Endoscopy;  Laterality: N/A;   INCISION AND DRAINAGE ABSCESS Right 04/17/2021   Procedure: INCISION AND DRAINAGE CHEST WALL ABSCESS;  Surgeon: Diamantina Monks, MD;  Location: [facility] OR;  Service: General;  Laterality: Right;   IR ANGIOGRAM PELVIS SELECTIVE OR SUPRASELECTIVE  12/07/2016   IR ANGIOGRAM PELVIS SELECTIVE OR SUPRASELECTIVE  12/07/2016   IR ANGIOGRAM SELECTIVE EACH ADDITIONAL VESSEL  12/07/2016   IR ANGIOGRAM SELECTIVE EACH ADDITIONAL VESSEL  12/07/2016   IR EMBO ARTERIAL NOT HEMORR HEMANG INC GUIDE ROADMAPPING  12/07/2016   IR RADIOLOGIST EVAL & MGMT  01/09/2017   IR US GUIDE VASC ACCESS RIGHT  12/07/2016   IRRIGATION AND DEBRIDEMENT FOOT Right 04/07/2020   Procedure: Incision and Drainage Right Foot;  Surgeon: Park Liter, DPM;  Location: [facility] ORS;  Service: Podiatry;  Laterality: Right;   RIGHT HEART CATH N/A 08/14/2022   Procedure: RIGHT HEART CATH;  Surgeon: Runell Gess, MD;  Location: [facility] INVASIVE CV LAB;  Service: Cardiovascular;  Laterality: N/A;    Family Hx:  Family History  Problem Relation Age of Onset    Diabetes Father    Healthy Mother     Social History:  reports that she quit smoking about 27 years ago. Her smoking use included cigarettes. She has never used smokeless tobacco. She reports that she does not currently use drugs after having used the following drugs: Marijuana and Cocaine. She reports that she does not drink alcohol.  Allergies:  Allergies  Allergen Reactions   Morphine And Codeine Itching and Swelling   Zestril [Lisinopril] Nausea Only and Swelling    Medications: Prior to Admission medications   Medication Sig Start Date End Date Taking? Authorizing Provider  albuterol (VENTOLIN HFA) 108 (90 Base) MCG/ACT inhaler Inhale 2 puffs into the lungs every 4 (four) hours as needed for wheezing or shortness of breath. 11/30/19   Zannie Cove, MD  amLODipine (NORVASC) 10 MG tablet Take 1 tablet (10 mg total) by mouth daily. 08/28/22 09/27/22  Robbie Lis M, PA-C  apixaban (ELIQUIS) 5 MG TABS tablet Take 1 tablet (5 mg total) by mouth 2 (two) times daily. 08/28/22   Allayne Butcher, PA-C  aspirin 81 MG chewable tablet Chew 81 mg by mouth daily.    [provider]  atorvastatin (LIPITOR) 40 MG tablet Take 1 tablet (40 mg total) by mouth daily. 08/28/22 09/27/22  Robbie Lis M, PA-C  Blood Glucose Monitoring Suppl (ONETOUCH VERIO FLEX SYSTEM) w/Device KIT Use in the morning, at noon, and at bedtime. 05/23/22   Arrien, York Ram, MD  carvedilol (COREG) 25 MG tablet Take 1 tablet (25 mg total) by mouth 2 (two) times daily with a meal. 08/28/22 09/27/22  Robbie Lis M, PA-C  cyclobenzaprine (FLEXERIL) 10 MG tablet Take 10 mg by mouth 3 (three) times daily as needed for muscle spasms.    [provider]  dicyclomine (BENTYL) 10 MG capsule Take 1 capsule (10 mg total) by mouth 4 (four) times daily -  before meals and at bedtime. 08/20/21  Cheron Schaumann K, PA-C  diphenhydrAMINE (BENADRYL) 25 MG tablet Take 25 mg by mouth every 6 (six) hours as needed for  allergies.    [provider]  famotidine (PEPCID) 20 MG tablet Take 20 mg by mouth at bedtime. 04/11/21   [provider]  ferrous sulfate 325 (65 FE) MG tablet Take 325 mg by mouth daily with breakfast.    [provider]  furosemide (LASIX) 20 MG tablet TAKE 3 TABLETS(60 MG) BY MOUTH TWICE DAILY 11/22/22   Bensimhon, Bevelyn Buckles, MD  glucose blood (ONETOUCH VERIO) test strip Use as instructed to check blood sugar 3 times daily. Dx codes: E11.8, E11.65 12/15/2018   Hoy Register, MD  hydrALAZINE (APRESOLINE) 100 MG tablet Take 1 tablet (100 mg total) by mouth every 8 (eight) hours. 08/28/22   Robbie Lis M, PA-C  Insulin Pen Needle (PEN NEEDLES 3/16") 31G X 5 MM MISC 1 Device by Does not apply route 4 (four) times daily - after meals and at bedtime. 11/28/18   Grayce Sessions, NP  INSULIN SYRINGE .5CC/28G (INS SYRINGE/NEEDLE .5CC/28G) 28G X 1/2" 0.5 ML MISC Please provide 1 month supply 09/04/17   Scherrie Gerlach, MD  isosorbide mononitrate (IMDUR) 30 MG 24 hr tablet Take 3 tablets (90 mg total) by mouth daily. 08/28/22 09/27/22  Allayne Butcher, PA-C  linaclotide (LINZESS) 145 MCG CAPS capsule Take 145 mcg by mouth daily as needed (for constipation).    [provider]  meclizine (ANTIVERT) 25 MG tablet Take 1 tablet (25 mg total) by mouth 3 (three) times daily as needed for dizziness. 04/26/21   Glade Lloyd, MD  naproxen (NAPROSYN) 375 MG tablet Take 1 tablet (375 mg total) by mouth 2 (two) times daily. 12/28/22   Kommor, Madison, MD  omeprazole (PRILOSEC) 40 MG capsule Take 40 mg by mouth daily before breakfast. 03/10/21   [provider]  OneTouch Delica Lancets 33G MISC Use to check blood sugar 3 times daily. Dx codes: E11.8, E11.65 December 15, 2018   Hoy Register, MD  oxyCODONE-acetaminophen (PERCOCET) 10-325 MG tablet Take 1 tablet by mouth 3 (three) times daily.    [provider]  promethazine-dextromethorphan (PROMETHAZINE-DM) 6.25-15 MG/5ML  syrup Take 2.5 mLs by mouth 4 (four) times daily as needed for cough. 12/28/22   Kommor, Madison, MD  senna (SENOKOT) 8.6 MG TABS tablet Take 1 tablet (8.6 mg total) by mouth 2 (two) times daily. 04/26/21   Glade Lloyd, MD    I have reviewed the patient's current and reported prior to admission medications.  Labs:     Latest Ref Rng & Units 04/18/2023   10:50 AM 04/17/2023    8:04 AM 04/16/2023    4:03 PM  BMP  Glucose 70 - 99 mg/dL 440  102  725   BUN 6 - 20 mg/dL 75  58  46   Creatinine 0.44 - 1.00 mg/dL 3.66  4.40  3.47   Sodium 135 - 145 mmol/L 137  140  138   Potassium 3.5 - 5.1 mmol/L 4.5  4.6  4.2   Chloride 98 - 111 mmol/L 112  113  113   CO2 22 - 32 mmol/L 16  13  14    Calcium 8.9 - 10.3 mg/dL 8.2  8.5  8.3     Urinalysis    Component Value Date/Time   COLORURINE YELLOW 04/16/2023 0017   APPEARANCEUR CLEAR 04/16/2023 0017   LABSPEC 1.014 04/16/2023 0017   PHURINE 6.0 04/16/2023 0017   GLUCOSEU 150 (A)  04/16/2023 0017   GLUCOSEU > 1000 mg/dL (A) 47/42/5956 3875   HGBUR SMALL (A) 04/16/2023 0017   HGBUR negative 08/12/2008 1444   BILIRUBINUR NEGATIVE 04/16/2023 0017   BILIRUBINUR neg 02/07/2017 1609   KETONESUR NEGATIVE 04/16/2023 0017   PROTEINUR >=300 (A) 04/16/2023 0017   UROBILINOGEN 1.0 02/07/2017 1609   UROBILINOGEN 0.2 11/11/2014 1658   NITRITE NEGATIVE 04/16/2023 0017   LEUKOCYTESUR NEGATIVE 04/16/2023 0017     ROS:  Pertinent items noted in HPI and remainder of ROS otherwise negative.  Note that history is limited by AMS  Physical Exam: Vitals:   04/18/23 0738 04/18/23 1200  BP: (!) 153/72 (!) 145/93  Pulse: (!) 103 (!) 105  Resp: 18 18  Temp: 97.8 F (36.6 C)   SpO2: 95% 95%     General: adult female in bed in NAD but sleepy  HEENT: NCAT Eyes: EOMI sclera anicteric Neck:supple trachea midline  Heart: S1S2 tachycardic; no rub Lungs: crackles bilaterally; unlabored on room air; transmitted upper airway sounds Abdomen: soft/obese  habitus/nontender Extremities: trace edema bilateral lower extremities  Skin: no rash on extremities exposed Psych - no anxiety or agitation but provides limited history Neuro: provides name and year and location but slow to respond to questions  GU no foley   Assessment/Plan:   # CKD stage V with progression to ESRD  - No emergent indication for starting HD today.  - Will plan for HD tomorrow after tunneled catheter placement with IR.  Would anticipate HD on 12/27 as well   - Will make her NPO after midnight tonight for access tomorrow  - Lasix 80 mg IV once now  - Will need CLIP   - Would hold eliquis at this time - see has been held  - Will also need AVF placement.  Vein mapping ordered outpatient but I do not see that this was done.  I will order vein mapping  - Listed as being on naproxen at home - hopefully not accurate.  She was ordered toradol once PRN on chart review and this was discontinued.  Would avoid NSAID's to preserve residual renal function  # Influenza A  - Therapies per primary team  - She is on tamiflu and has already gotten three doses.  Given her AKI on CKD would hold next dose and repeat tamiflu on Friday, 12/27.  I have reached out to her primary team  # Metabolic acidosis  - on oral bicarb for now - stop when she gets access and starts HD   # Chronic systolic CHF  - Optimize volume status with HD   # HTN  - Continue current regimen and optimize volume status with HD    # Anemia of CKD - She was recently referred to start ESA outpatient then was admitted  - Check iron stores - Anticipate will need ESA.  Hb here variable    # Metabolic bone disease  - Hyperphoshatemia  - Starting HD and anticipate will need binder.  She is on a renal diet when taking PO    Thank you for the consult.  Please do not hesitate to contact me with any questions  Estanislado Emms 04/18/2023, 2:32 PM

## 2023-04-18 NOTE — Progress Notes (Signed)
PHARMACY - ANTICOAGULATION CONSULT NOTE  Pharmacy Consult for heparin Indication: chronic DVT R politeal vein  Allergies  Allergen Reactions   Morphine And Codeine Itching and Swelling   Zestril [Lisinopril] Nausea Only and Swelling    Patient Measurements: Height: 5\' 7"  (170.2 cm) Weight: 105.3 kg (232 lb 2.3 oz) IBW/kg (Calculated) : 61.6 Heparin Dosing Weight: 80.7kg  Vital Signs: Temp: 98.3 F (36.8 C) (12/25 2023) Temp Source: Oral (12/25 2023) BP: 117/68 (12/25 2023) Pulse Rate: 77 (12/25 2023)  Labs: Recent Labs    04/15/23 2327 04/16/23 0800 04/16/23 0800 04/16/23 1603 04/16/23 1902 04/17/23 0804 04/18/23 1050 04/18/23 2219  HGB  --  10.5*  --   --   --  8.6*  --   --   HCT  --  31.0*  --   --   --  25.5*  --   --   PLT  --  411*  --   --   --  335  --   --   APTT 25  --   --   --   --   --   --   --   HEPARINUNFRC <0.10* 0.19*  --   --   --   --   --  0.85*  CREATININE  --  4.73*   < > 5.26*  --  6.51* 7.79*  --   CKTOTAL  --   --   --   --  1,630* 2,213* 1,657*  --   TROPONINIHS 59*  --   --   --   --   --   --   --    < > = values in this interval not displayed.    Estimated Creatinine Clearance: 10.9 mL/min (A) (by C-G formula based on SCr of 7.79 mg/dL (H)).  Assessment: 16 yoF presented to ED with chest pain and shortness of breath. PMH includes: HFrEF (45-50%), CKD Stage 4, CAD s/p PCI, stroke, T2DM, h/o DVT/PE (h/o eliquis >> patient states no longer taking). Pharmacy consulted to dose heparin for history of DVT/PE. Heparin was then changed to SQ heparin 12/24 after VQ scan negative for PE. 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 Pharmacy consulted to restart IV heparin. Last SQ heparin 5000 units given 1330. Hgb down to 8.6, ptl wnl. Noted that pt was subtherapeutic (heparin level 0.19) on 12/22 on heparin 1300 units/hr with SCr 4.7, now SCr up to 7.79.  12/25 PM update:  Heparin level supra-therapeutic   Goal of Therapy:   Heparin level 0.3-0.7 units/ml Monitor platelets by anticoagulation protocol: Yes   Plan:  Dec heparin to 1150 units/hr Check anti-Xa level in 8 hours and daily while on heparin  Continue to monitor H&H and platelets  Abran Duke, PharmD, BCPS Clinical Pharmacist Phone: [phone]

## 2023-04-18 NOTE — Plan of Care (Signed)
  Problem: Metabolic: Goal: Ability to maintain appropriate glucose levels will improve Outcome: Progressing   Problem: Skin Integrity: Goal: Risk for impaired skin integrity will decrease Outcome: Progressing   Problem: Clinical Measurements: Goal: Will remain free from infection Outcome: Progressing   Problem: Activity: Goal: Risk for activity intolerance will decrease Outcome: Progressing   Problem: Safety: Goal: Ability to remain free from injury will improve Outcome: Progressing   Problem: Skin Integrity: Goal: Risk for impaired skin integrity will decrease Outcome: Progressing

## 2023-04-18 NOTE — Progress Notes (Signed)
PHARMACY - ANTICOAGULATION CONSULT NOTE  Pharmacy Consult for heparin Indication: chronic DVT R politeal vein  Allergies  Allergen Reactions   Morphine And Codeine Itching and Swelling   Zestril [Lisinopril] Nausea Only and Swelling    Patient Measurements: Height: 5\' 7"  (170.2 cm) Weight: 105.3 kg (232 lb 2.3 oz) IBW/kg (Calculated) : 61.6 Heparin Dosing Weight: 80.7kg  Vital Signs: Temp: 97.8 F (36.6 C) (12/25 0738) BP: 145/93 (12/25 1200) Pulse Rate: 105 (12/25 1200)  Labs: Recent Labs    04/15/23 2122 04/15/23 2327 04/16/23 0800 04/16/23 1603 04/16/23 1902 04/17/23 0804 04/18/23 1050  HGB 9.4*  --  10.5*  --   --  8.6*  --   HCT 28.1*  --  31.0*  --   --  25.5*  --   PLT 390  --  411*  --   --  335  --   APTT  --  25  --   --   --   --   --   HEPARINUNFRC  --  <0.10* 0.19*  --   --   --   --   CREATININE 4.51*  --  4.73* 5.26*  --  6.51* 7.79*  CKTOTAL  --   --   --   --  1,630* 2,213* 1,657*  TROPONINIHS 61* 59*  --   --   --   --   --     Estimated Creatinine Clearance: 10.9 mL/min (A) (by C-G formula based on SCr of 7.79 mg/dL (H)).  Assessment: 60 yoF presented to ED with chest pain and shortness of breath. PMH includes: HFrEF (45-50%), CKD Stage 4, CAD s/p PCI, stroke, T2DM, h/o DVT/PE (h/o eliquis >> patient states no longer taking). Pharmacy consulted to dose heparin for history of DVT/PE. Heparin was then changed to SQ heparin 12/24 after VQ scan negative for PE. 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 Pharmacy consulted to restart IV heparin. Last SQ heparin 5000 units given 1330. Hgb down to 8.6, ptl wnl. Noted that pt was subtherapeutic (heparin level 0.19) on 12/22 on heparin 1300 units/hr with SCr 4.7, now SCr up to 7.79.  Goal of Therapy:  Heparin level 0.3-0.7 units/ml Monitor platelets by anticoagulation protocol: Yes   Plan:  D/c SQ heparin Heparin bolus 2500 units (~1/2 bolus with SQ heparin just given) Start heparin  infusion at 1300 units/hr Check anti-Xa level in 8 hours and daily while on heparin  Continue to monitor H&H and platelets  Christoper Fabian, PharmD, BCPS Please see amion for complete clinical pharmacist phone list 04/18/2023 2:06 PM

## 2023-04-19 ENCOUNTER — Encounter (HOSPITAL_COMMUNITY): Payer: Self-pay | Admitting: Family Medicine

## 2023-04-19 ENCOUNTER — Inpatient Hospital Stay (HOSPITAL_COMMUNITY): Payer: 59

## 2023-04-19 DIAGNOSIS — N186 End stage renal disease: Secondary | ICD-10-CM | POA: Diagnosis not present

## 2023-04-19 DIAGNOSIS — N179 Acute kidney failure, unspecified: Secondary | ICD-10-CM | POA: Diagnosis not present

## 2023-04-19 DIAGNOSIS — R0789 Other chest pain: Secondary | ICD-10-CM | POA: Diagnosis not present

## 2023-04-19 HISTORY — PX: IR FLUORO GUIDE CV LINE RIGHT: IMG2283

## 2023-04-19 HISTORY — PX: IR US GUIDE VASC ACCESS RIGHT: IMG2390

## 2023-04-19 LAB — CBC
HCT: 26.8 % — ABNORMAL LOW (ref 36.0–46.0)
Hemoglobin: 9.2 g/dL — ABNORMAL LOW (ref 12.0–15.0)
MCH: 26.6 pg (ref 26.0–34.0)
MCHC: 34.3 g/dL (ref 30.0–36.0)
MCV: 77.5 fL — ABNORMAL LOW (ref 80.0–100.0)
Platelets: 364 10*3/uL (ref 150–400)
RBC: 3.46 MIL/uL — ABNORMAL LOW (ref 3.87–5.11)
RDW: 16.2 % — ABNORMAL HIGH (ref 11.5–15.5)
WBC: 13.9 10*3/uL — ABNORMAL HIGH (ref 4.0–10.5)
nRBC: 0.5 % — ABNORMAL HIGH (ref 0.0–0.2)

## 2023-04-19 LAB — RENAL FUNCTION PANEL
Albumin: 2.3 g/dL — ABNORMAL LOW (ref 3.5–5.0)
Anion gap: 13 (ref 5–15)
BUN: 85 mg/dL — ABNORMAL HIGH (ref 6–20)
CO2: 13 mmol/L — ABNORMAL LOW (ref 22–32)
Calcium: 8.2 mg/dL — ABNORMAL LOW (ref 8.9–10.3)
Chloride: 112 mmol/L — ABNORMAL HIGH (ref 98–111)
Creatinine, Ser: 8.33 mg/dL — ABNORMAL HIGH (ref 0.44–1.00)
GFR, Estimated: 5 mL/min — ABNORMAL LOW (ref >60)
Glucose, Bld: 101 mg/dL — ABNORMAL HIGH (ref 70–99)
Phosphorus: 7.9 mg/dL — ABNORMAL HIGH (ref 2.5–4.6)
Potassium: 4.6 mmol/L (ref 3.5–5.1)
Sodium: 138 mmol/L (ref 135–145)

## 2023-04-19 LAB — GLUCOSE, CAPILLARY
Glucose-Capillary: 121 mg/dL — ABNORMAL HIGH (ref 70–99)
Glucose-Capillary: 96 mg/dL (ref 70–99)
Glucose-Capillary: 89 mg/dL (ref 70–99)
Glucose-Capillary: 99 mg/dL (ref 70–99)
Glucose-Capillary: 103 mg/dL — ABNORMAL HIGH (ref 70–99)

## 2023-04-19 LAB — HEPARIN LEVEL (UNFRACTIONATED): Heparin Unfractionated: 0.98 [IU]/mL — ABNORMAL HIGH (ref 0.30–0.70)

## 2023-04-19 LAB — HEPATITIS B SURFACE ANTIGEN: Hepatitis B Surface Ag: REACTIVE — AB

## 2023-04-19 LAB — CK: Total CK: 1102 U/L — ABNORMAL HIGH (ref 38–234)

## 2023-04-19 MED ORDER — CEFAZOLIN SODIUM-DEXTROSE 2-4 GM/100ML-% IV SOLN
INTRAVENOUS | Status: AC
  Filled 2023-04-19: qty 100

## 2023-04-19 MED ORDER — LIDOCAINE HCL 1 % IJ SOLN
INTRAMUSCULAR | Status: AC
  Filled 2023-04-19: qty 20

## 2023-04-19 MED ORDER — MIDAZOLAM HCL 2 MG/2ML IJ SOLN
INTRAMUSCULAR | Status: AC
  Filled 2023-04-19: qty 2

## 2023-04-19 MED ORDER — CEFAZOLIN SODIUM-DEXTROSE 2-4 GM/100ML-% IV SOLN
INTRAVENOUS | Status: AC | PRN
  Administered 2023-04-19: 2 g via INTRAVENOUS

## 2023-04-19 MED ORDER — HEPARIN SODIUM (PORCINE) 1000 UNIT/ML IJ SOLN
INTRAMUSCULAR | Status: AC
  Filled 2023-04-19: qty 10

## 2023-04-19 MED ORDER — FENTANYL CITRATE (PF) 100 MCG/2ML IJ SOLN
INTRAMUSCULAR | Status: AC
  Filled 2023-04-19: qty 2

## 2023-04-19 MED ORDER — MIDAZOLAM HCL 2 MG/2ML IJ SOLN
INTRAMUSCULAR | Status: AC | PRN
  Administered 2023-04-19: .5 mg via INTRAVENOUS

## 2023-04-19 MED ORDER — HEPARIN (PORCINE) 25000 UT/250ML-% IV SOLN
950.0000 [IU]/h | INTRAVENOUS | Status: DC
  Administered 2023-04-19: 950 [IU]/h via INTRAVENOUS
  Filled 2023-04-19: qty 250

## 2023-04-19 MED ORDER — CEFAZOLIN SODIUM-DEXTROSE 2-4 GM/100ML-% IV SOLN: 2.0000 g | INTRAVENOUS | Status: AC

## 2023-04-19 MED ORDER — FENTANYL CITRATE (PF) 100 MCG/2ML IJ SOLN
INTRAMUSCULAR | Status: AC | PRN
  Administered 2023-04-19: 25 ug via INTRAVENOUS

## 2023-04-19 MED ORDER — OSELTAMIVIR PHOSPHATE 30 MG PO CAPS
30.0000 mg | ORAL_CAPSULE | Freq: Once | ORAL | Status: AC
  Administered 2023-04-19: 30 mg via ORAL
  Filled 2023-04-19: qty 1

## 2023-04-19 MED ORDER — LIDOCAINE HCL (PF) 1 % IJ SOLN
20.0000 mL | Freq: Once | INTRAMUSCULAR | Status: AC
  Administered 2023-04-19: 20 mL via INTRADERMAL

## 2023-04-19 NOTE — Progress Notes (Signed)
[facility] Progress Note    Assessment/ Plan:   CKD5, progressed to ESRD -Dr. Malen Gauze discussed with patient's daughter on 12/25 in regards to starting HD and they did consent -starting HD#1 today, pending TDC with IR--appreciate assistance -will inform renal navigator -tentatively planning on HD#2 tomorrow -Avoid nephrotoxic medications including NSAIDs and iodinated intravenous contrast exposure unless the latter is absolutely indicated.  Preferred narcotic agents for pain control are hydromorphone, fentanyl, and methadone. Morphine should not be used. Avoid Baclofen and avoid oral sodium phosphate and magnesium citrate based laxatives / bowel preps. Continue strict Input and Output monitoring. Will monitor the patient closely with you and intervene or adjust therapy as indicated by changes in clinical status/labs   Severe sepsis Influenza A -per primary service  HTN -UF as tolerated with HD. BP acceptable  Metabolic acidosis -on PO bicarb, plan to stop once on HD  HFrEF -UF as tolerated with HD  DM2 -Per primary  Anemia of CKD -transfuse prn for hgb <7 -will check Fe panel  Chronic DVT -on heparin gtt -per primary  CKD MBD -renal diet -checking PTH  Subjective:   Patient seen and examined bedside. No acute events charted. Drowsy during my encounter, only answering some questions Uop charted 0.5L+1 unmeasured void   Objective:   BP (!) 160/84 (BP Location: Right Arm)   Pulse 80   Temp (!) 97.4 F (36.3 C) (Oral)   Resp 16   Ht 5\' 7"  (1.702 m)   Wt 105.4 kg   LMP 01/22/2021 Comment: neg preg test 04/17/21  SpO2 100%   BMI 36.39 kg/m   Intake/Output Summary (Last 24 hours) at 04/19/2023 0746 Last data filed at 04/18/2023 1500 Gross per 24 hour  Intake 505 ml  Output 500 ml  Net 5 ml   Weight change: 0.1 kg  Physical Exam: Gen: ill appearing, NAD, drowsy CVS: RRR Resp: decreased breath sounds bibasilar, normal wob Abd: soft Ext:  trace to 1+ pitting edema b/l Les Neuro: drowsy, intermittently answering questions  Imaging: US RENAL Result Date: 04/18/2023 CLINICAL DATA:  Acute kidney injury Anuria EXAM: RENAL / URINARY TRACT ULTRASOUND COMPLETE COMPARISON:  08/11/2022 FINDINGS: Right Kidney: Renal measurements: 9.6 x 5.1 x 5.6 cm = volume: 145 mL. Mild increased echogenicity the renal cortex without significant thinning. No mass or hydronephrosis visualized. Left Kidney: Renal measurements: 10.0 x 4.9 x 5.5 cm = volume: 140 mL. Mild increased echogenicity the renal cortex without significant thinning. No mass or hydronephrosis visualized. Bladder: Appears normal for degree of bladder distention. Other: None. IMPRESSION: Unchanged mild increased echogenicity of the renal cortices without significant thinning, consistent with chronic medical renal disease. No hydronephrosis. Electronically Signed   By: Acquanetta Belling M.D.   On: 04/18/2023 13:36   VAS [place] LOWER EXTREMITY VENOUS (DVT) Result Date: 04/18/2023  Lower Venous DVT Study Patient Name:  ASAKO ALLEGRO Riggenbach  Date of Exam:   04/17/2023 Medical Rec #: [id number]         Accession #:    [id number] Date of Birth: 02/01/1976         Patient Gender: F Patient Age:   47 years Exam Location:  [facility] Procedure:      VAS [place] LOWER EXTREMITY VENOUS (DVT) Referring Phys: Gunnar Fusi SIMPSON --------------------------------------------------------------------------------  Indications: Pulmonary embolism.  Risk Factors: Confirmed PE. Comparison Study: Previous study on 11.29.2023. Performing Technologist: Fernande Bras  Examination Guidelines: A complete evaluation includes B-mode imaging, spectral Doppler, color Doppler, and power Doppler as  needed of all accessible portions of each vessel. Bilateral testing is considered an integral part of a complete examination. Limited examinations for reoccurring indications may be performed as noted. The reflux portion of the exam is performed  with the patient in reverse Trendelenburg.  +---------+---------------+---------+-----------+----------+-----------------+ RIGHT    CompressibilityPhasicitySpontaneityPropertiesThrombus Aging    +---------+---------------+---------+-----------+----------+-----------------+ CFV      Full           Yes      Yes                                    +---------+---------------+---------+-----------+----------+-----------------+ SFJ      Full           Yes      Yes                                    +---------+---------------+---------+-----------+----------+-----------------+ FV Prox  Partial                                                        +---------+---------------+---------+-----------+----------+-----------------+ FV Mid   Full                                                           +---------+---------------+---------+-----------+----------+-----------------+ FV DistalFull                                                           +---------+---------------+---------+-----------+----------+-----------------+ PFV      Full                                                           +---------+---------------+---------+-----------+----------+-----------------+ POP      Partial        Yes      Yes                  Fibrin stranding. +---------+---------------+---------+-----------+----------+-----------------+ PTV      Full                                                           +---------+---------------+---------+-----------+----------+-----------------+ PERO     Full                                                           +---------+---------------+---------+-----------+----------+-----------------+   +---------+---------------+---------+-----------+----------+--------------+  LEFT     CompressibilityPhasicitySpontaneityPropertiesThrombus Aging +---------+---------------+---------+-----------+----------+--------------+ CFV      Full            Yes      Yes                                 +---------+---------------+---------+-----------+----------+--------------+ SFJ      Full           Yes      Yes                                 +---------+---------------+---------+-----------+----------+--------------+ FV Prox  Full                                                        +---------+---------------+---------+-----------+----------+--------------+ FV Mid   Full                                                        +---------+---------------+---------+-----------+----------+--------------+ FV DistalFull                                                        +---------+---------------+---------+-----------+----------+--------------+ PFV      Full                                                        +---------+---------------+---------+-----------+----------+--------------+ POP      Full           Yes      Yes                                 +---------+---------------+---------+-----------+----------+--------------+ PTV      Full                                                        +---------+---------------+---------+-----------+----------+--------------+ PERO     Full                                                        +---------+---------------+---------+-----------+----------+--------------+     Summary: RIGHT: - Findings consistent with 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  - No cystic structure found in the popliteal fossa.  LEFT: - There is no evidence of deep vein thrombosis in the lower extremity.  - No cystic structure found in the popliteal  fossa.  *See table(s) above for measurements and observations. Electronically signed by Gerarda Fraction on 04/18/2023 at 11:53:52 AM.    Final     Labs: BMET Recent Labs  Lab 04/15/23 2122 04/16/23 0800 04/16/23 1603 04/17/23 0804 04/18/23 1050  NA 140 138 138 140 137  K 4.5 4.6 4.2 4.6 4.5  CL  112* 111 113* 113* 112*  CO2 17* 17* 14* 13* 16*  GLUCOSE 105* 155* 120* 142* 118*  BUN 42* 44* 46* 58* 75*  CREATININE 4.51* 4.73* 5.26* 6.51* 7.79*  CALCIUM 8.6* 8.6* 8.3* 8.5* 8.2*  PHOS  --  4.8*  --   --  7.5*   CBC Recent Labs  Lab 04/15/23 2122 04/16/23 0800 04/17/23 0804 04/19/23 0655  WBC 17.5* 15.7* 11.1* 13.9*  NEUTROABS 14.5* 14.1*  --   --   HGB 9.4* 10.5* 8.6* 9.2*  HCT 28.1* 31.0* 25.5* 26.8*  MCV 79.8* 80.1 79.4* 77.5*  PLT 390 411* 335 364    Medications:     amLODipine  10 mg Oral Daily   carvedilol  25 mg Oral BID WC   Chlorhexidine Gluconate Cloth  6 each Topical Daily   Chlorhexidine Gluconate Cloth  6 each Topical Q0600   hydrALAZINE  100 mg Oral Q8H   insulin aspart  0-6 Units Subcutaneous Q4H   oseltamivir  30 mg Oral Once   pantoprazole (PROTONIX) IV  40 mg Intravenous Q24H   sodium bicarbonate  650 mg Oral Daily   sodium chloride flush  3-10 mL Intravenous Q12H      Anthony Sar, MD [facility] 04/19/2023, 7:46 AM

## 2023-04-19 NOTE — Progress Notes (Signed)
Requested to see pt for out-pt HD needs at d/c. Will f/u with pt tomorrow am.   Olivia Canter Renal Navigator [phone]

## 2023-04-19 NOTE — Progress Notes (Addendum)
PROGRESS NOTE    Shirley [name]  ZOX:[id number] DOB: [birth year] DOA: 04/15/2023 PCP: Bettey Costa, NP   Brief Narrative: Shirley [name] is a 47 y.o. female with a history of CKD stage IV, hypertension, HFmrEF, CAD s/p PCI, stroke, OSA, recurrent VTE.  Patient presented secondary to flu-like symptoms, pleuritic chest pain and chest heaviness found to have influenza A infection. Patient started on Tamiflu for treatment. Hospitalization complicated by acute metabolic encephalopathy in addition to hypotension, requiring a one day admission to ICU. Although encephalopathy and hypotension improved, patient developed worsening AKI on underlying renal disease in addition to anuria/oliguria. Nephrology consulted.   Assessment and Plan:  Oliguric AKI on CKD stage V Baseline creatinine of 2.8. Creatinine of 4.51 on admission. Although patient has no urine output, bladder scan shows 181 mL. BUN and creatinine continue to trend upward. BUN/creatinine of 75/7.79, respectively. Per nephrology, patient is now progressed to ESRD requiring hemodialysis -Nephrology recommendations: plan for hemodialysis initiation, TDC placement, Lasix IV  Metabolic acidosis Secondary to renal disease. 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 PTH is ordered and pending.  Influenza A infection -Continue Tamiflu  Acute metabolic encephalopathy Unclear etiology but presumed secondary to sepsis and complicated by underlying renal disease. Overall, encephalopathy is improved from prior documentation. May further improved after hemodialysis.  Severe sepsis Secondary to influenza infection. Present on admission. Managed via treatment of underlying infection. Blood cultures obtained and are no growth to date.  Hypotension Concerned this was secondary to volume depletion and was give IV fluids while in ICU. Hypotension resolved.  HFrEF LVEF  of 45-50% from 02/2022. Patient with evidence of bilateral lower extremity edema. Concerning patient may be developing fluid overload secondary to worsening renal function.  Chest pain Chest x-ray imaging clear. D-dimer elevated at 1.95. V/Q scan obtained and was negative for acute PE.  Chronic DVT Lower extremity venous duplex on 12/24 revealed a right popliteal vein chronic DVT. -Heparin IV with eventual transition to Eliquis once no more procedures planned  Diabetes mellitus type 2 Well controlled with hemoglobin A1C of 6.5%. -Continue SSI  Gastroparesis Noted history.  CAD Angina Patient is on Imdur, Lipitor and aspirin as an outpatient which were held.  Primary hypertension -Continue amlodipine and Coreg  History of CVA Noted. Patient is on aspirin and Lipitor which are held.  Sleep apnea Noted.  Class II obesity Estimated body mass index is 36.39 kg/m as calculated from the following:   Height as of this encounter: 5\' 7"  (1.702 m).   Weight as of this encounter: 105.4 kg.   DVT prophylaxis: Heparin IV Code Status:   Code Status: Full Code Family Communication: None at bedside Disposition Plan: Discharge pending continued specialist recommendations and transition to outpatient medication regimen   Consultants:  PCCM Nephrology  Procedures:  LE venous duplex  Antimicrobials: Oseltamivir    Subjective: No concerns this morning.  Objective: BP (!) 160/84 (BP Location: Right Arm)   Pulse 80   Temp (!) 97.4 F (36.3 C) (Oral)   Resp 16   Ht 5\' 7"  (1.702 m)   Wt 105.4 kg   LMP 01/22/2021 Comment: neg preg test 04/17/21  SpO2 100%   BMI 36.39 kg/m   Examination:  General exam: Appears calm and comfortable Respiratory system: Clear to auscultation. Respiratory effort normal. Cardiovascular system: S1 & S2 heard, RRR. Gastrointestinal system: Abdomen is nondistended, soft and nontender. Normal bowel sounds heard.  Central nervous system: Asleep  but arouses to a somnolent state. Musculoskeletal: No edema. No calf tenderness   Data Reviewed: I have personally reviewed following labs and imaging studies  CBC Lab Results  Component Value Date   WBC 13.9 (H) 04/19/2023   RBC 3.46 (L) 04/19/2023   HGB 9.2 (L) 04/19/2023   HCT 26.8 (L) 04/19/2023   MCV 77.5 (L) 04/19/2023   MCH 26.6 04/19/2023   PLT 364 04/19/2023   MCHC 34.3 04/19/2023   RDW 16.2 (H) 04/19/2023   LYMPHSABS 0.6 (L) 04/16/2023   MONOABS 0.8 04/16/2023   EOSABS 0.0 04/16/2023   BASOSABS 0.0 04/16/2023     Last metabolic panel Lab Results  Component Value Date   NA 137 04/18/2023   K 4.5 04/18/2023   CL 112 (H) 04/18/2023   CO2 16 (L) 04/18/2023   BUN 75 (H) 04/18/2023   CREATININE 7.79 (H) 04/18/2023   GLUCOSE 118 (H) 04/18/2023   GFRNONAA 6 (L) 04/18/2023   GFRAA 40 (L) 01/03/2020   CALCIUM 8.2 (L) 04/18/2023   PHOS 7.5 (H) 04/18/2023   PROT 6.0 (L) 04/16/2023   ALBUMIN 2.3 (L) 04/18/2023   LABGLOB 3.4 02/07/2017   AGRATIO 1.1 (L) 02/07/2017   BILITOT 0.6 04/16/2023   ALKPHOS 53 04/16/2023   AST 26 04/16/2023   ALT 11 04/16/2023   ANIONGAP 9 04/18/2023    GFR: Estimated Creatinine Clearance: 10.9 mL/min (A) (by C-G formula based on SCr of 7.79 mg/dL (H)).  Recent Results (from the past 240 hours)  Resp panel by RT-PCR (RSV, Flu A&B, Covid) Anterior Nasal Swab     Status: Abnormal   Collection Time: 04/15/23  9:23 PM   Specimen: Anterior Nasal Swab  Result Value Ref Range Status   SARS Coronavirus 2 by RT PCR NEGATIVE NEGATIVE Final   Influenza A by PCR POSITIVE (A) NEGATIVE Final   Influenza B by PCR NEGATIVE NEGATIVE Final    Comment: (NOTE) The Xpert Xpress SARS-CoV-2/FLU/RSV plus assay is intended as an aid in the diagnosis of influenza from Nasopharyngeal swab specimens and should not be used as a sole basis for treatment. Nasal washings and aspirates are unacceptable for Xpert Xpress SARS-CoV-2/FLU/RSV testing.  Fact Sheet  for Patients: BloggerCourse.com  Fact Sheet for Healthcare Providers: SeriousBroker.it  This test is not yet approved or cleared by the [place] FDA and has been authorized for detection and/or diagnosis of SARS-CoV-2 by FDA under an Emergency Use Authorization (EUA). This EUA will remain in effect (meaning this test can be used) for the duration of the COVID-19 declaration under Section 564(b)(1) of the Act, 21 U.S.C. section 360bbb-3(b)(1), unless the authorization is terminated or revoked.     Resp Syncytial Virus by PCR NEGATIVE NEGATIVE Final    Comment: (NOTE) Fact Sheet for Patients: BloggerCourse.com  Fact Sheet for Healthcare Providers: SeriousBroker.it  This test is not yet approved or cleared by the [place] FDA and has been authorized for detection and/or diagnosis of SARS-CoV-2 by FDA under an Emergency Use Authorization (EUA). This EUA will remain in effect (meaning this test can be used) for the duration of the COVID-19 declaration under Section 564(b)(1) of the Act, 21 U.S.C. section 360bbb-3(b)(1), unless the authorization is terminated or revoked.  Performed at [facility], [address], [place], [place] [address]   Culture, blood (Routine X 2) w Reflex to ID Panel     Status: None (Preliminary result)   Collection Time: 04/16/23  2:16 AM  Specimen: BLOOD  Result Value Ref Range Status   Specimen Description BLOOD SITE NOT SPECIFIED  Final   Special Requests   Final    BOTTLES DRAWN AEROBIC AND ANAEROBIC Blood Culture adequate volume   Culture   Final    NO GROWTH 3 DAYS Performed at [facility], [address] [place], [place] [address]    Report Status PENDING  Incomplete  Culture, blood (Routine X 2) w Reflex to ID Panel     Status: None (Preliminary result)   Collection Time: 04/16/23  2:26 AM   Specimen: BLOOD  Result  Value Ref Range Status   Specimen Description BLOOD SITE NOT SPECIFIED  Final   Special Requests   Final    BOTTLES DRAWN AEROBIC AND ANAEROBIC Blood Culture results may not be optimal due to an inadequate volume of blood received in culture bottles   Culture   Final    NO GROWTH 3 DAYS Performed at [facility], [address], [place], [place] [address]    Report Status PENDING  Incomplete  MRSA Next Gen by PCR, Nasal     Status: None   Collection Time: 04/16/23  5:07 PM   Specimen: Nasal Mucosa; Nasal Swab  Result Value Ref Range Status   MRSA by PCR Next Gen NOT DETECTED NOT DETECTED Final    Comment: (NOTE) The GeneXpert MRSA Assay (FDA approved for NASAL specimens only), is one component of a comprehensive MRSA colonization surveillance program. It is not intended to diagnose MRSA infection nor to guide or monitor treatment for MRSA infections. Test performance is not FDA approved in patients less than 29 years old. Performed at [facility], [address], [place], [place] [address]       Radiology Studies: US RENAL Result Date: 04/18/2023 CLINICAL DATA:  Acute kidney injury Anuria EXAM: RENAL / URINARY TRACT ULTRASOUND COMPLETE COMPARISON:  08/11/2022 FINDINGS: Right Kidney: Renal measurements: 9.6 x 5.1 x 5.6 cm = volume: 145 mL. Mild increased echogenicity the renal cortex without significant thinning. No mass or hydronephrosis visualized. Left Kidney: Renal measurements: 10.0 x 4.9 x 5.5 cm = volume: 140 mL. Mild increased echogenicity the renal cortex without significant thinning. No mass or hydronephrosis visualized. Bladder: Appears normal for degree of bladder distention. Other: None. IMPRESSION: Unchanged mild increased echogenicity of the renal cortices without significant thinning, consistent with chronic medical renal disease. No hydronephrosis. Electronically Signed   By: Acquanetta Belling M.D.   On: 04/18/2023 13:36   VAS [place] LOWER EXTREMITY VENOUS  (DVT) Result Date: 04/18/2023  Lower Venous DVT Study Patient Name:  DOLLYE TRAYWICK Nelles  Date of Exam:   04/17/2023 Medical Rec #: [id number]         Accession #:    [id number] Date of Birth: [birth year]         Patient Gender: F Patient Age:   26 years Exam Location:  [facility] Procedure:      VAS [place] LOWER EXTREMITY VENOUS (DVT) Referring Phys: Gunnar Fusi SIMPSON --------------------------------------------------------------------------------  Indications: Pulmonary embolism.  Risk Factors: Confirmed PE. Comparison Study: Previous study on 11.29.2023. Performing Technologist: Fernande Bras  Examination Guidelines: A complete evaluation includes B-mode imaging, spectral Doppler, color Doppler, and power Doppler as needed of all accessible portions of each vessel. Bilateral testing is considered an integral part of a complete examination. Limited examinations for reoccurring indications may be performed as noted. The reflux portion of the exam is performed with the patient  in reverse Trendelenburg.  +---------+---------------+---------+-----------+----------+-----------------+ RIGHT    CompressibilityPhasicitySpontaneityPropertiesThrombus Aging    +---------+---------------+---------+-----------+----------+-----------------+ CFV      Full           Yes      Yes                                    +---------+---------------+---------+-----------+----------+-----------------+ SFJ      Full           Yes      Yes                                    +---------+---------------+---------+-----------+----------+-----------------+ FV Prox  Partial                                                        +---------+---------------+---------+-----------+----------+-----------------+ FV Mid   Full                                                           +---------+---------------+---------+-----------+----------+-----------------+ FV DistalFull                                                            +---------+---------------+---------+-----------+----------+-----------------+ PFV      Full                                                           +---------+---------------+---------+-----------+----------+-----------------+ POP      Partial        Yes      Yes                  Fibrin stranding. +---------+---------------+---------+-----------+----------+-----------------+ PTV      Full                                                           +---------+---------------+---------+-----------+----------+-----------------+ PERO     Full                                                           +---------+---------------+---------+-----------+----------+-----------------+   +---------+---------------+---------+-----------+----------+--------------+ LEFT     CompressibilityPhasicitySpontaneityPropertiesThrombus Aging +---------+---------------+---------+-----------+----------+--------------+ CFV      Full           Yes      Yes                                 +---------+---------------+---------+-----------+----------+--------------+  SFJ      Full           Yes      Yes                                 +---------+---------------+---------+-----------+----------+--------------+ FV Prox  Full                                                        +---------+---------------+---------+-----------+----------+--------------+ FV Mid   Full                                                        +---------+---------------+---------+-----------+----------+--------------+ FV DistalFull                                                        +---------+---------------+---------+-----------+----------+--------------+ PFV      Full                                                        +---------+---------------+---------+-----------+----------+--------------+ POP      Full           Yes      Yes                                  +---------+---------------+---------+-----------+----------+--------------+ PTV      Full                                                        +---------+---------------+---------+-----------+----------+--------------+ PERO     Full                                                        +---------+---------------+---------+-----------+----------+--------------+     Summary: RIGHT: - Findings consistent with 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  - No cystic structure found in the popliteal fossa.  LEFT: - There is no evidence of deep vein thrombosis in the lower extremity.  - No cystic structure found in the popliteal fossa.  *See table(s) above for measurements and observations. Electronically signed by Gerarda Fraction on 04/18/2023 at 11:53:52 AM.    Final       LOS: 4 days    Jacquelin Hawking, MD [facility] 04/19/2023, 7:44 AM   If 7PM-7AM, please contact night-coverage [email or web address]

## 2023-04-19 NOTE — Consult Note (Signed)
Chief Complaint: Patient was seen in consultation today for tunneled HD cath placement  Chief Complaint  Patient presents with   Chest Pain   Shortness of Breath   at the request of Vallery Sa   Referring Physician(s):  Vallery Sa   Supervising Physician: Irish Lack  Patient Status: [facility] - In-pt  History of Present Illness: Shirley [name] is a 47 y.o. female with PMHs of CKD stage IV, hypertension, HFmrEF, CAD s/p PCI, stroke, OSA, recurrent VTE, IDA, admitted due to influenza infection, oliguric AKI on CKD, metabolic acidosis, metabolic encephalopathy, and sepsis. IR was consulted for tunneled HD cath placement.   Patient presented to ED with SOB on 12/22, she was found to have severe sepsis and concern for PE, she was admitted for further eval and management. Nephrology was consulted on 12/25, patient appears to be progressed to ESRD. Initiation of HD after obtaining an access was recommended to the patient and her family who decided to proceed.   Patient seen in room, laying in bed and NAD but appears uncomfortable.  Says that she needs to go to bathroom. No other complaints.   Past Medical History:  Diagnosis Date   Abscess of tunica vaginalis    10/09- Abundant S. aureus- sensitive to all abx   Anxiety    Blood dyscrasia    CAD (coronary artery disease) 06/15/2006   s/p Subendocardial MI with PDA angioplasty(no stent) on 06/15/06 and relook  cath 06/19/06 showed patency of site. Cath 12/10- no restenosis or significant CAD progression   CVA (cerebral vascular accident) (HCC) ~ 02/2014   denies residual on 04/22/2014   CVA (cerebral vascular accident) [facility])    history of remote right cerebellar infarct noted on head CT at least since 10/2011   Depression    Diabetes mellitus type 2, uncontrolled, with complications    Fibromyalgia    Gastritis    Gastroparesis    secondary to poorly controlled DM, last emptying study performed 01/2010  was normal but may be  falsely positive as pt was on reglan   GERD (gastroesophageal reflux disease)    Hepatitis B, chronic (HCC)    Hep BeAb+,Hep B cAb+ & Hep BsAg+ (9/06)   History of pyelonephritis    H/o GrpB Pyelonephritis (9/06) and UTI- 07/11- E.Coli, 12/10- GBS   Hyperlipidemia    Hypertension    Iron deficiency anemia    Irregular menses    Small ovarian follicles seen on CT(9/06)   MI (myocardial infarction) (HCC) 05/2006   PDA percutaneous transluminal coronary angioplasty   Migraine    "weekly" (04/22/2014)   N&V (nausea and vomiting)    Chronic. Unclear etiology with multiple admission and ED visits. CT abdomen with and without contrast (02/2011)  showed no acute process. Gastic Emptying scan (01/2010) was normal. Ultrasound of the abdomen was within normal limits. Hepatitis B viral load was undectable. HIV NR. EGD - gastritis, Hpylori + s/p Rx   New onset of congestive heart failure (HCC) 05/16/2022   Obesity    OSA (obstructive sleep apnea)    "suppose to wear mask but I don't" (04/22/2014)   Peripheral neuropathy    Pneumonia    "this is probably the 2nd or 3rd time I've had pneumonia" (04/22/2014)   Recurrent boils    Seasonal asthma    Substance abuse (HCC)    Thrombocytosis    Hem/Onc suggested 2/2 chronic hepatits and/or iron deficiency anemia    Past Surgical History:  Procedure Laterality Date  CESAREAN SECTION  1997   CORONARY ANGIOPLASTY WITH STENT PLACEMENT  2008   "2 stents"   ESOPHAGOGASTRODUODENOSCOPY N/A 04/23/2014   Procedure: ESOPHAGOGASTRODUODENOSCOPY (EGD);  Surgeon: Vertell Novak., MD;  Location: [facility] ENDOSCOPY;  Service: Endoscopy;  Laterality: N/A;   INCISION AND DRAINAGE ABSCESS Right 04/17/2021   Procedure: INCISION AND DRAINAGE CHEST WALL ABSCESS;  Surgeon: Diamantina Monks, MD;  Location: [facility] OR;  Service: General;  Laterality: Right;   IR ANGIOGRAM PELVIS SELECTIVE OR SUPRASELECTIVE  12/07/2016   IR ANGIOGRAM PELVIS SELECTIVE OR SUPRASELECTIVE  12/07/2016    IR ANGIOGRAM SELECTIVE EACH ADDITIONAL VESSEL  12/07/2016   IR ANGIOGRAM SELECTIVE EACH ADDITIONAL VESSEL  12/07/2016   IR EMBO ARTERIAL NOT HEMORR HEMANG INC GUIDE ROADMAPPING  12/07/2016   IR RADIOLOGIST EVAL & MGMT  01/09/2017   IR US GUIDE VASC ACCESS RIGHT  12/07/2016   IRRIGATION AND DEBRIDEMENT FOOT Right 04/07/2020   Procedure: Incision and Drainage Right Foot;  Surgeon: Park Liter, DPM;  Location: [facility] ORS;  Service: Podiatry;  Laterality: Right;   RIGHT HEART CATH N/A 08/14/2022   Procedure: RIGHT HEART CATH;  Surgeon: Runell Gess, MD;  Location: [facility] INVASIVE CV LAB;  Service: Cardiovascular;  Laterality: N/A;    Allergies: Morphine and codeine and Zestril [lisinopril]  Medications: Prior to Admission medications   Medication Sig Start Date End Date Taking? Authorizing Provider  albuterol (VENTOLIN HFA) 108 (90 Base) MCG/ACT inhaler Inhale 2 puffs into the lungs every 4 (four) hours as needed for wheezing or shortness of breath. 11/30/19   Zannie Cove, MD  amLODipine (NORVASC) 10 MG tablet Take 1 tablet (10 mg total) by mouth daily. 08/28/22 09/27/22  Robbie Lis M, PA-C  apixaban (ELIQUIS) 5 MG TABS tablet Take 1 tablet (5 mg total) by mouth 2 (two) times daily. 08/28/22   Allayne Butcher, PA-C  aspirin 81 MG chewable tablet Chew 81 mg by mouth daily.    [provider]  atorvastatin (LIPITOR) 40 MG tablet Take 1 tablet (40 mg total) by mouth daily. 08/28/22 09/27/22  Robbie Lis M, PA-C  Blood Glucose Monitoring Suppl (ONETOUCH VERIO FLEX SYSTEM) w/Device KIT Use in the morning, at noon, and at bedtime. 05/23/22   Arrien, York Ram, MD  carvedilol (COREG) 25 MG tablet Take 1 tablet (25 mg total) by mouth 2 (two) times daily with a meal. 08/28/22 09/27/22  Robbie Lis M, PA-C  cyclobenzaprine (FLEXERIL) 10 MG tablet Take 10 mg by mouth 3 (three) times daily as needed for muscle spasms.    [provider]  dicyclomine (BENTYL) 10 MG  capsule Take 1 capsule (10 mg total) by mouth 4 (four) times daily -  before meals and at bedtime. 08/20/21   Elson Areas, PA-C  diphenhydrAMINE (BENADRYL) 25 MG tablet Take 25 mg by mouth every 6 (six) hours as needed for allergies.    [provider]  famotidine (PEPCID) 20 MG tablet Take 20 mg by mouth at bedtime. 04/11/21   [provider]  ferrous sulfate 325 (65 FE) MG tablet Take 325 mg by mouth daily with breakfast.    [provider]  furosemide (LASIX) 20 MG tablet TAKE 3 TABLETS(60 MG) BY MOUTH TWICE DAILY 11/22/22   Bensimhon, Bevelyn Buckles, MD  glucose blood (ONETOUCH VERIO) test strip Use as instructed to check blood sugar 3 times daily. Dx codes: E11.8, E11.65 12/20/2018   Hoy Register, MD  hydrALAZINE (APRESOLINE) 100 MG tablet Take 1 tablet (100 mg  total) by mouth every 8 (eight) hours. 08/28/22   Robbie Lis M, PA-C  Insulin Pen Needle (PEN NEEDLES 3/16") 31G X 5 MM MISC 1 Device by Does not apply route 4 (four) times daily - after meals and at bedtime. 11/28/18   Grayce Sessions, NP  INSULIN SYRINGE .5CC/28G (INS SYRINGE/NEEDLE .5CC/28G) 28G X 1/2" 0.5 ML MISC Please provide 1 month supply 09/04/17   Scherrie Gerlach, MD  isosorbide mononitrate (IMDUR) 30 MG 24 hr tablet Take 3 tablets (90 mg total) by mouth daily. 08/28/22 09/27/22  Allayne Butcher, PA-C  linaclotide (LINZESS) 145 MCG CAPS capsule Take 145 mcg by mouth daily as needed (for constipation).    [provider]  meclizine (ANTIVERT) 25 MG tablet Take 1 tablet (25 mg total) by mouth 3 (three) times daily as needed for dizziness. 04/26/21   Glade Lloyd, MD  naproxen (NAPROSYN) 375 MG tablet Take 1 tablet (375 mg total) by mouth 2 (two) times daily. 12/28/22   Kommor, Madison, MD  omeprazole (PRILOSEC) 40 MG capsule Take 40 mg by mouth daily before breakfast. 03/10/21   [provider]  OneTouch Delica Lancets 33G MISC Use to check blood sugar 3 times daily. Dx codes: E11.8,  E11.65 12/25/18   Hoy Register, MD  oxyCODONE-acetaminophen (PERCOCET) 10-325 MG tablet Take 1 tablet by mouth 3 (three) times daily.    [provider]  promethazine-dextromethorphan (PROMETHAZINE-DM) 6.25-15 MG/5ML syrup Take 2.5 mLs by mouth 4 (four) times daily as needed for cough. 12/28/22   Kommor, Madison, MD  senna (SENOKOT) 8.6 MG TABS tablet Take 1 tablet (8.6 mg total) by mouth 2 (two) times daily. 04/26/21   Glade Lloyd, MD     Family History  Problem Relation Age of Onset   Diabetes Father    Healthy Mother     Social History   Socioeconomic History   Marital status: Single    Spouse name: Not on file   Number of children: 2   Years of education: 67   Highest education level: Not on file  Occupational History   Occupation: other    Comment: unemployed    Comment: disability  Tobacco Use   Smoking status: Former    Current packs/day: 0.00    Types: Cigarettes    Quit date: 04/24/1996    Years since quitting: 27.0   Smokeless tobacco: Never   Tobacco comments:    quit smoking cigarettes age 89  Vaping Use   Vaping status: Never Used  Substance and Sexual Activity   Alcohol use: No    Alcohol/week: 0.0 standard drinks of alcohol    Comment: 04/22/2014 "might have a few drinks a month"   Drug use: Not Currently    Types: Marijuana, Cocaine    Comment: 04/22/2104 "quit drugs ~ 1-2 yr ago"   Sexual activity: Yes    Birth control/protection: None  Other Topics Concern   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         Social Drivers of Corporate investment banker Strain: Low Risk  (08/09/2022)   Overall Financial Resource Strain (CARDIA)    Difficulty of Paying Living Expenses: Not hard at all  Food Insecurity: No Food Insecurity (04/16/2023)   Hunger Vital Sign    Worried About Running Out of  Food in  the Last Year: Never true    Ran Out of Food in the Last Year: Never true  Transportation Needs: Patient Unable To Answer (04/16/2023)   PRAPARE - Transportation    Lack of Transportation (Medical): Patient unable to answer    Lack of Transportation (Non-Medical): Patient unable to answer  Physical Activity: Not on file  Stress: Not on file  Social Connections: Not on file     Review of Systems: A 12 point ROS discussed and pertinent positives are indicated in the HPI above.  All other systems are negative.  Vital Signs: BP (!) 160/84 (BP Location: Right Arm)   Pulse 80   Temp (!) 97.4 F (36.3 C) (Oral)   Resp 16   Ht 5\' 7"  (1.702 m)   Wt 232 lb 5.8 oz (105.4 kg)   LMP 01/22/2021 Comment: neg preg test 04/17/21  SpO2 100%   BMI 36.39 kg/m    Physical Exam Vitals reviewed.  Constitutional:      General: She is not in acute distress.    Appearance: She is not ill-appearing.  HENT:     Head: Normocephalic and atraumatic.  Cardiovascular:     Rate and Rhythm: Normal rate and regular rhythm.     Heart sounds: Normal heart sounds.  Pulmonary:     Breath sounds: Examination of the right-upper field reveals rales. Examination of the left-upper field reveals wheezing and rales. Examination of the right-middle field reveals rales. Examination of the left-middle field reveals rales. Examination of the right-lower field reveals rales. Examination of the left-lower field reveals rales. Wheezing and rales present.     Comments: Slightly labored  Abdominal:     General: Bowel sounds are normal.     Palpations: Abdomen is soft.  Musculoskeletal:     Cervical back: Neck supple.  Skin:    General: Skin is warm and dry.     Coloration: Skin is not cyanotic or pale.  Neurological:     Mental Status: She is alert.  Psychiatric:        Behavior: Behavior normal.     MD Evaluation Airway: WNL Heart: WNL Abdomen: WNL Chest/ Lungs: WNL ASA  Classification:  3 Mallampati/Airway Score: Two  Imaging: US RENAL Result Date: 04/18/2023 CLINICAL DATA:  Acute kidney injury Anuria EXAM: RENAL / URINARY TRACT ULTRASOUND COMPLETE COMPARISON:  08/11/2022 FINDINGS: Right Kidney: Renal measurements: 9.6 x 5.1 x 5.6 cm = volume: 145 mL. Mild increased echogenicity the renal cortex without significant thinning. No mass or hydronephrosis visualized. Left Kidney: Renal measurements: 10.0 x 4.9 x 5.5 cm = volume: 140 mL. Mild increased echogenicity the renal cortex without significant thinning. No mass or hydronephrosis visualized. Bladder: Appears normal for degree of bladder distention. Other: None. IMPRESSION: Unchanged mild increased echogenicity of the renal cortices without significant thinning, consistent with chronic medical renal disease. No hydronephrosis. Electronically Signed   By: Acquanetta Belling M.D.   On: 04/18/2023 13:36   VAS [place] LOWER EXTREMITY VENOUS (DVT) Result Date: 04/18/2023  Lower Venous DVT Study Patient Name:  Shirley [name]  Date of Exam:   04/17/2023 Medical Rec #: [id number]         Accession #:    [id number] Date of Birth: 26-Jan-1976         Patient Gender: F Patient Age:   7 years Exam Location:  [facility] Procedure:      VAS [place] LOWER EXTREMITY VENOUS (DVT) Referring Phys: Gunnar Fusi SIMPSON --------------------------------------------------------------------------------  Indications: Pulmonary embolism.  Risk Factors: Confirmed PE. Comparison Study: Previous study on 11.29.2023. Performing Technologist: Fernande Bras  Examination Guidelines: A complete evaluation includes B-mode imaging, spectral Doppler, color Doppler, and power Doppler as needed of all accessible portions of each vessel. Bilateral testing is considered an integral part of a complete examination. Limited examinations for reoccurring indications may be performed as noted. The reflux portion of the exam is performed with the patient in reverse Trendelenburg.   +---------+---------------+---------+-----------+----------+-----------------+ RIGHT    CompressibilityPhasicitySpontaneityPropertiesThrombus Aging    +---------+---------------+---------+-----------+----------+-----------------+ CFV      Full           Yes      Yes                                    +---------+---------------+---------+-----------+----------+-----------------+ SFJ      Full           Yes      Yes                                    +---------+---------------+---------+-----------+----------+-----------------+ FV Prox  Partial                                                        +---------+---------------+---------+-----------+----------+-----------------+ FV Mid   Full                                                           +---------+---------------+---------+-----------+----------+-----------------+ FV DistalFull                                                           +---------+---------------+---------+-----------+----------+-----------------+ PFV      Full                                                           +---------+---------------+---------+-----------+----------+-----------------+ POP      Partial        Yes      Yes                  Fibrin stranding. +---------+---------------+---------+-----------+----------+-----------------+ PTV      Full                                                           +---------+---------------+---------+-----------+----------+-----------------+ PERO     Full                                                           +---------+---------------+---------+-----------+----------+-----------------+   +---------+---------------+---------+-----------+----------+--------------+  LEFT     CompressibilityPhasicitySpontaneityPropertiesThrombus Aging +---------+---------------+---------+-----------+----------+--------------+ CFV      Full           Yes      Yes                                  +---------+---------------+---------+-----------+----------+--------------+ SFJ      Full           Yes      Yes                                 +---------+---------------+---------+-----------+----------+--------------+ FV Prox  Full                                                        +---------+---------------+---------+-----------+----------+--------------+ FV Mid   Full                                                        +---------+---------------+---------+-----------+----------+--------------+ FV DistalFull                                                        +---------+---------------+---------+-----------+----------+--------------+ PFV      Full                                                        +---------+---------------+---------+-----------+----------+--------------+ POP      Full           Yes      Yes                                 +---------+---------------+---------+-----------+----------+--------------+ PTV      Full                                                        +---------+---------------+---------+-----------+----------+--------------+ PERO     Full                                                        +---------+---------------+---------+-----------+----------+--------------+     Summary: RIGHT: - Findings consistent with 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  - No cystic structure found in the popliteal fossa.  LEFT: - There is no evidence of deep vein thrombosis in the lower extremity.  - No cystic structure found in the popliteal  fossa.  *See table(s) above for measurements and observations. Electronically signed by Gerarda Fraction on 04/18/2023 at 11:53:52 AM.    Final    CT HEAD WO CONTRAST ( ) Result Date: 04/16/2023 CLINICAL DATA:  Mental status change, unknown cause EXAM: CT HEAD WITHOUT CONTRAST TECHNIQUE: Contiguous axial images were obtained from the base of the  skull through the vertex without intravenous contrast. RADIATION DOSE REDUCTION: This exam was performed according to the departmental dose-optimization program which includes automated exposure control, adjustment of the mA and/or kV according to patient size and/or use of iterative reconstruction technique. COMPARISON:  CT head 01/09/2016. FINDINGS: Brain: Remote right cerebellar infarct. Small remote right caudate lacunar infarct. No evidence of acute large vascular territory infarct, acute hemorrhage, mass lesion, midline shift or hydrocephalus. Vascular: No hyperdense vessel. Skull: Normal. Negative for fracture or focal lesion. Sinuses/Orbits: No acute finding. IMPRESSION: No evidence of acute intracranial abnormality. Electronically Signed   By: Feliberto Harts M.D.   On: 04/16/2023 18:25   DG CHEST PORT 1 VIEW Result Date: 04/16/2023 CLINICAL DATA:  Shock. History of diabetes, gastroesophageal reflux disease, and hypertension. EXAM: PORTABLE CHEST 1 VIEW COMPARISON:  04/15/2023 FINDINGS: Shallow inspiration. Cardiac enlargement. No vascular congestion, edema, or consolidation. No pleural effusions. No pneumothorax. Mediastinal contours appear intact. Degenerative changes in the spine. IMPRESSION: Cardiac enlargement.  No evidence of active pulmonary disease. Electronically Signed   By: Burman Nieves M.D.   On: 04/16/2023 17:21   NM Pulmonary Perfusion Result Date: 04/16/2023 CLINICAL DATA:  Pulmonary embolism suspected, high probability. Shortness of breath. History of chronic kidney disease. EXAM: NUCLEAR MEDICINE PERFUSION LUNG SCAN TECHNIQUE: Perfusion images were obtained in multiple projections after intravenous injection of radiopharmaceutical. Ventilation scans intentionally deferred if perfusion scan and chest x-ray adequate for interpretation since COVID 19 epidemic. RADIOPHARMACEUTICALS:  4.4 mCi Tc-10m MAA IV COMPARISON:  Chest radiographs 04/15/2023 and 08/07/2022. Nuclear medicine  perfusion lung scan 03/21/2022 and 11/25/2019. FINDINGS: There is improved perfusion to the superior segment of the right lower lobe compared with the previous study. Patchy perfusion to both lungs consistent with chronic obstructive disease and small pleural effusions. There are no wedge-shaped perfusion defects to suggest pulmonary embolism. IMPRESSION: No evidence of acute pulmonary embolism on perfusion scintigraphy by PISAPED criteria. Consider correlation with lower extremity venous Doppler ultrasound. Electronically Signed   By: Carey Bullocks M.D.   On: 04/16/2023 09:34   DG Chest 2 View Result Date: 04/15/2023 CLINICAL DATA:  Shortness of breath EXAM: CHEST - 2 VIEW COMPARISON:  None Available. FINDINGS: Heart is upper limits normal in size. Mediastinal contours within normal limits. No confluent airspace opacities, effusions or edema. No acute bony abnormality. IMPRESSION: No active cardiopulmonary disease. Electronically Signed   By: Charlett Nose M.D.   On: 04/15/2023 20:49    Labs:  CBC: Recent Labs    04/15/23 2122 04/16/23 0800 04/17/23 0804 04/19/23 0655  WBC 17.5* 15.7* 11.1* 13.9*  HGB 9.4* 10.5* 8.6* 9.2*  HCT 28.1* 31.0* 25.5* 26.8*  PLT 390 411* 335 364    COAGS: Recent Labs    08/13/22 1552 08/13/22 2138 08/14/22 0737 04/15/23 2327  APTT 82* 34 90* 25    BMP: Recent Labs    04/16/23 1603 04/17/23 0804 04/18/23 1050 04/19/23 0655  NA 138 140 137 138  K 4.2 4.6 4.5 4.6  CL 113* 113* 112* 112*  CO2 14* 13* 16* 13*  GLUCOSE 120* 142* 118* 101*  BUN 46* 58* 75* 85*  CALCIUM 8.3* 8.5*  8.2* 8.2*  CREATININE 5.26* 6.51* 7.79* 8.33*  GFRNONAA 9* 7* 6* 5*    LIVER FUNCTION TESTS: Recent Labs    04/15/23 2122 04/16/23 0800 04/16/23 1603 04/18/23 1050 04/19/23 0655  BILITOT 1.2* 0.5 0.6  --   --   AST 38 35 26  --   --   ALT 14 15 11   --   --   ALKPHOS 64 66 53  --   --   PROT 7.0 7.5 6.0*  --   --   ALBUMIN 2.5* 2.6* 2.1* 2.3* 2.3*    TUMOR  MARKERS: No results for input(s): "AFPTM", "CEA", "CA199", "CHROMGRNA" in the last 8760 hours.  Assessment and Plan: 47 y.o. female with ESRD who presents for tunneled HD cath placement.   NPO since MN VSS CBC with mild leukocytosis but two blood cx obtained on 12/23 showing no growth for 3 days  On heparin infusion, will need to be hold for 2-3 hrs per Dr. Fredia Sorrow. Asked team to start holding at 9 am.  2 g ancef to be given during the procedure   Risks and benefits discussed with the patient including, but not limited to bleeding, infection, vascular injury, pneumothorax which may require chest tube placement, air embolism or even death  All of the patient's daughter Miss LA-precious Litter's questions were answered, daughter is agreeable to proceed.  Consent signed and in IR.    Thank you for this interesting consult.  I greatly enjoyed meeting Shirley [name] and look forward to participating in their care.  A copy of this report was sent to the requesting provider on this date.  Electronically Signed: Willette Brace, PA-C 04/19/2023, 9:09 AM   I spent a total of 40 Minutes    in face to face in clinical consultation, greater than 50% of which was counseling/coordinating care for tunneled HD cath placement.   This chart was dictated using voice recognition software.  Despite best efforts to proofread,  errors can occur which can change the documentation meaning.

## 2023-04-19 NOTE — Progress Notes (Signed)
PHARMACY - ANTICOAGULATION CONSULT NOTE  Pharmacy Consult for heparin Indication: chronic DVT R politeal vein  Allergies  Allergen Reactions   Morphine And Codeine Itching and Swelling   Zestril [Lisinopril] Nausea Only and Swelling    Patient Measurements: Height: 5\' 7"  (170.2 cm) Weight: 105.4 kg (232 lb 5.8 oz) IBW/kg (Calculated) : 61.6 Heparin Dosing Weight: 80.7kg  Vital Signs: Temp: 97.4 F (36.3 C) (12/26 0729) Temp Source: Oral (12/26 0729) BP: 160/84 (12/26 0729) Pulse Rate: 80 (12/26 0729)  Labs: Recent Labs    04/16/23 0800 04/16/23 1603 04/16/23 1902 04/17/23 0804 04/18/23 1050 04/18/23 2219 04/19/23 0655  HGB 10.5*  --   --  8.6*  --   --  9.2*  HCT 31.0*  --   --  25.5*  --   --  26.8*  PLT 411*  --   --  335  --   --  364  HEPARINUNFRC 0.19*  --   --   --   --  0.85* 0.98*  CREATININE 4.73* 5.26*  --  6.51* 7.79*  --   --   CKTOTAL  --   --  1,630* 2,213* 1,657*  --   --     Estimated Creatinine Clearance: 10.9 mL/min (A) (by C-G formula based on SCr of 7.79 mg/dL (H)).  Assessment: 72 yoF presented to ED with chest pain and shortness of breath. PMH includes: HFrEF (45-50%), CKD Stage 4, CAD s/p PCI, stroke, T2DM, h/o DVT/PE (h/o eliquis >> patient states no longer taking). Pharmacy consulted to dose heparin for history of DVT/PE. Heparin was then changed to SQ heparin 12/24 after VQ scan negative for PE. 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 Pharmacy consulted to restart IV heparin. Last SQ heparin 5000 units given 1330. Hgb down to 8.6, ptl wnl. Noted that pt was subtherapeutic (heparin level 0.19) on 12/22 on heparin 1300 units/hr with SCr 4.7, now SCr up to 7.79.  12/26 AM: Heparin level supratherapeutic at 0.98 following rate decrease to 1150 units/hour. No signs of bleeding or issues with heparin infusion noted. CBC stable. Heparin level drawn from opposite side from where heparin was running.   Goal of Therapy:  Heparin  level 0.3-0.7 units/ml Monitor platelets by anticoagulation protocol: Yes   Plan:  Dec heparin to 950 units/hr Check anti-Xa level in 8 hours and daily while on heparin  Continue to monitor H&H and platelets  Jani Gravel, PharmD Clinical Pharmacist  04/19/2023 7:38 AM

## 2023-04-19 NOTE — Progress Notes (Signed)
PHARMACY - ANTICOAGULATION CONSULT NOTE  Pharmacy Consult for heparin Indication: chronic DVT R politeal vein  Allergies  Allergen Reactions   Morphine And Codeine Itching and Swelling   Zestril [Lisinopril] Nausea Only and Swelling    Patient Measurements: Height: 5\' 7"  (170.2 cm) Weight: 105.4 kg (232 lb 5.8 oz) IBW/kg (Calculated) : 61.6 Heparin Dosing Weight: 80.7kg  Vital Signs: Temp: 97.4 F (36.3 C) (12/26 0729) Temp Source: Oral (12/26 0729) BP: 122/68 (12/26 1645) Pulse Rate: 73 (12/26 1645)  Labs: Recent Labs    04/17/23 0804 04/18/23 1050 04/18/23 2219 04/19/23 0655  HGB 8.6*  --   --  9.2*  HCT 25.5*  --   --  26.8*  PLT 335  --   --  364  HEPARINUNFRC  --   --  0.85* 0.98*  CREATININE 6.51* 7.79*  --  8.33*  CKTOTAL 2,213* 1,657*  --  1,102*    Estimated Creatinine Clearance: 10.2 mL/min (A) (by C-G formula based on SCr of 8.33 mg/dL (H)).  Assessment: 67 yoF presented to ED with chest pain and shortness of breath. PMH includes: HFrEF (45-50%), CKD Stage 4, CAD s/p PCI, stroke, T2DM, h/o DVT/PE (h/o eliquis >> patient states no longer taking). Pharmacy consulted to dose heparin for history of DVT/PE. Heparin was then changed to SQ heparin 12/24 after VQ scan negative for PE. 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 Pharmacy consulted to restart IV heparin. Last SQ heparin 5000 units given 1330. Hgb down to 8.6, ptl wnl. Noted that pt was subtherapeutic (heparin level 0.19) on 12/22 on heparin 1300 units/hr with SCr 4.7, now SCr up to 7.79.  12/26 AM: Heparin level supratherapeutic at 0.98 following rate decrease to 1150 units/hour. No signs of bleeding or issues with heparin infusion noted. CBC stable. Heparin level drawn from opposite side from where heparin was running.   12/26 PM: s/p TDC placement. Spoke with Dr Fredia Sorrow and ok to resume IV heparin with no bolus.  Goal of Therapy:  Heparin level 0.3-0.7 units/ml Monitor platelets  by anticoagulation protocol: Yes   Plan:  Resume heparin drip at 950 units/hr - no bolus 8h heparin level Daily heparin level and CBC Monitor for s/sx of bleeding  Thank you for involving pharmacy in this patient's care.  Loura Back, PharmD, BCPS Clinical Pharmacist Clinical phone for 04/19/2023 is [phone] 04/19/2023 6:35 PM

## 2023-04-20 DIAGNOSIS — R0789 Other chest pain: Secondary | ICD-10-CM | POA: Diagnosis not present

## 2023-04-20 LAB — HEPATITIS B SURFACE ANTIBODY, QUANTITATIVE: Hep B S AB Quant (Post): <3.5 m[IU]/mL — ABNORMAL LOW

## 2023-04-20 LAB — HEPARIN LEVEL (UNFRACTIONATED)
Heparin Unfractionated: >1.1 [IU]/mL — ABNORMAL HIGH (ref 0.30–0.70)
Heparin Unfractionated: 0.21 [IU]/mL — ABNORMAL LOW (ref 0.30–0.70)

## 2023-04-20 LAB — GLUCOSE, CAPILLARY
Glucose-Capillary: 88 mg/dL (ref 70–99)
Glucose-Capillary: 106 mg/dL — ABNORMAL HIGH (ref 70–99)
Glucose-Capillary: 183 mg/dL — ABNORMAL HIGH (ref 70–99)
Glucose-Capillary: 134 mg/dL — ABNORMAL HIGH (ref 70–99)
Glucose-Capillary: 183 mg/dL — ABNORMAL HIGH (ref 70–99)
Glucose-Capillary: 146 mg/dL — ABNORMAL HIGH (ref 70–99)

## 2023-04-20 LAB — CBC
HCT: 25.4 % — ABNORMAL LOW (ref 36.0–46.0)
Hemoglobin: 9 g/dL — ABNORMAL LOW (ref 12.0–15.0)
MCH: 27 pg (ref 26.0–34.0)
MCHC: 35.4 g/dL (ref 30.0–36.0)
MCV: 76.3 fL — ABNORMAL LOW (ref 80.0–100.0)
Platelets: 317 10*3/uL (ref 150–400)
RBC: 3.33 MIL/uL — ABNORMAL LOW (ref 3.87–5.11)
RDW: 15.9 % — ABNORMAL HIGH (ref 11.5–15.5)
WBC: 11.9 10*3/uL — ABNORMAL HIGH (ref 4.0–10.5)
nRBC: 0.7 % — ABNORMAL HIGH (ref 0.0–0.2)

## 2023-04-20 LAB — PARATHYROID HORMONE, INTACT (NO CA): PTH: 273 pg/mL — ABNORMAL HIGH (ref 15–65)

## 2023-04-20 LAB — RENAL FUNCTION PANEL
Albumin: 2 g/dL — ABNORMAL LOW (ref 3.5–5.0)
Anion gap: 13 (ref 5–15)
BUN: 67 mg/dL — ABNORMAL HIGH (ref 6–20)
CO2: 17 mmol/L — ABNORMAL LOW (ref 22–32)
Calcium: 7.5 mg/dL — ABNORMAL LOW (ref 8.9–10.3)
Chloride: 104 mmol/L (ref 98–111)
Creatinine, Ser: 6.6 mg/dL — ABNORMAL HIGH (ref 0.44–1.00)
GFR, Estimated: 7 mL/min — ABNORMAL LOW (ref >60)
Glucose, Bld: 145 mg/dL — ABNORMAL HIGH (ref 70–99)
Phosphorus: 6.7 mg/dL — ABNORMAL HIGH (ref 2.5–4.6)
Potassium: 4.1 mmol/L (ref 3.5–5.1)
Sodium: 134 mmol/L — ABNORMAL LOW (ref 135–145)

## 2023-04-20 LAB — CK: Total CK: 549 U/L — ABNORMAL HIGH (ref 38–234)

## 2023-04-20 MED ORDER — HYDROCODONE-ACETAMINOPHEN 5-325 MG PO TABS
1.0000 | ORAL_TABLET | Freq: Two times a day (BID) | ORAL | Status: DC | PRN
  Administered 2023-04-20 – 2023-04-21 (×4): 1 via ORAL
  Filled 2023-04-20 (×4): qty 1

## 2023-04-20 MED ORDER — HEPARIN (PORCINE) 25000 UT/250ML-% IV SOLN
1050.0000 [IU]/h | INTRAVENOUS | Status: AC
  Administered 2023-04-20: 700 [IU]/h via INTRAVENOUS
  Administered 2023-04-21: 800 [IU]/h via INTRAVENOUS
  Administered 2023-04-22 – 2023-04-23 (×2): 900 [IU]/h via INTRAVENOUS
  Administered 2023-04-24: 1050 [IU]/h via INTRAVENOUS
  Filled 2023-04-20 (×4): qty 250

## 2023-04-20 MED ORDER — CALCIUM ACETATE (PHOS BINDER) 667 MG PO CAPS
667.0000 mg | ORAL_CAPSULE | Freq: Three times a day (TID) | ORAL | Status: DC
  Administered 2023-04-20 – 2023-04-26 (×15): 667 mg via ORAL
  Filled 2023-04-20 (×16): qty 1

## 2023-04-20 MED ORDER — IRON SUCROSE 200 MG IVPB - SIMPLE MED
200.0000 mg | Status: DC
  Administered 2023-04-20 – 2023-04-21 (×2): 200 mg via INTRAVENOUS
  Filled 2023-04-20: qty 110
  Filled 2023-04-20 (×2): qty 200

## 2023-04-20 NOTE — Evaluation (Signed)
Physical Therapy Evaluation Patient Details Name: Shirley [name] MRN: [id number] DOB: 1976/01/22 Today's Date: 04/20/2023  History of Present Illness  47 y.o. female presented 12/22 secondary to flu-like symptoms, pleuritic chest pain and chest heaviness found to have influenza A infection. Hospitalization complicated by acute metabolic encephalopathy in addition to hypotension. 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 with a history of CKD stage IV, hypertension, HFmrEF, CAD s/p PCI, stroke, OSA, recurrent VTE.    Clinical Impression  Pt admitted with above diagnosis. Reports boyfriend assists with ADLs at baseline, ambulates without assistive device, however boyfriend works. She thinks family can arrange 24/7 care amongst themselves if needed at d/c. Oriented x3 but with great effort and lethargic throughout session, somewhat restless and moaning. Required min assist for bed mobility, and transfer with walker out of bed. Unable to ambulate safely at this time. Pt currently with functional limitations due to the deficits listed below (see PT Problem List). I do think she has the potential to return home safely with family care and HHPT but would need to make some appreciable functional improvements prior to d/c. This will likely depend on how long she is admitted. We will work diligently with her to maximize function - requested mobility specialists to assist during admission. For now I would consider initiating SNF process to not delay needed rehab when she is medically ready to go. Pt will benefit from acute skilled PT to increase their independence and safety with mobility to allow discharge.           If plan is discharge home, recommend the following: A Beg help with walking and/or transfers;A lot of help with bathing/dressing/bathroom;Assistance with cooking/housework;Direct supervision/assist for medications management;Direct supervision/assist  for financial management;Assist for transportation;Supervision due to cognitive status   Can travel by private vehicle   Yes    Equipment Recommendations Rolling walker (2 wheels)  Recommendations for Other Services       Functional Status Assessment Patient has had a recent decline in their functional status and demonstrates the ability to make significant improvements in function in a reasonable and predictable amount of time.     Precautions / Restrictions Precautions Precautions: Fall Restrictions Weight Bearing Restrictions Per Provider Order: No      Mobility  Bed Mobility Overal bed mobility: Needs Assistance Bed Mobility: Supine to Sit     Supine to sit: Min assist, Used rails, HOB elevated     General bed mobility comments: Min assist to facilitate and sequence LEs off bed, and reach for rail. Assisted trunk to rise to EOB. Cues throughout.    Transfers Overall transfer level: Needs assistance Equipment used: Rolling walker (2 wheels) Transfers: Sit to/from Stand, Bed to chair/wheelchair/BSC Sit to Stand: Min assist   Step pivot transfers: Min assist       General transfer comment: Min assist for boost and to facilitate rise from EOB. Pt scooting forward repeatedly despite cues for safety initially. Min assist for RW control with step pivot, cues for sequencing to chair.    Ambulation/Gait               General Gait Details: Unable to safely progress at this time.  Stairs            Wheelchair Mobility     Tilt Bed    Modified Rankin (Stroke Patients Only)       Balance Overall balance assessment: Needs assistance Sitting-balance support: No upper extremity  supported, Feet supported Sitting balance-Leahy Scale: Fair Sitting balance - Comments: CGA   Standing balance support: Bilateral upper extremity supported, Reliant on assistive device for balance Standing balance-Leahy Scale: Poor                                Pertinent Vitals/Pain Pain Assessment Pain Assessment: PAINAD Breathing: occasional labored breathing, short period of hyperventilation Negative Vocalization: occasional moan/groan, low speech, negative/disapproving quality Facial Expression: sad, frightened, frown Body Language: tense, distressed pacing, fidgeting Consolability: distracted or reassured by voice/touch PAINAD Score: 5 Pain Intervention(s): Monitored during session, Repositioned    Home Living Family/patient expects to be discharged to:: Private residence Living Arrangements: Spouse/significant other Available Help at Discharge: Other (Comment) (boyfriend - thinks she can arrange 24/7 supervision if needed) Type of Home: Apartment Home Access: Level entry       Home Layout: One level Home Equipment: None      Prior Function Prior Level of Function : Patient poor historian/Family not available             Mobility Comments: No AD, denies falls ADLs Comments: Reports boyfriend assists with all ADLs. [place] at baseline.     Extremity/Trunk Assessment   Upper Extremity Assessment Upper Extremity Assessment: Defer to OT evaluation    Lower Extremity Assessment Lower Extremity Assessment: Generalized weakness;Difficult to assess due to impaired cognition       Communication   Communication Communication: No apparent difficulties Cueing Techniques: Verbal cues;Gestural cues;Tactile cues  Cognition Arousal: Lethargic Behavior During Therapy: Restless Overall Cognitive Status: No family/caregiver present to determine baseline cognitive functioning                                 General Comments: Oriented x3. Very delayed with responses.        General Comments      Exercises     Assessment/Plan    PT Assessment Patient needs continued PT services  PT Problem List Decreased strength;Decreased activity tolerance;Decreased balance;Decreased mobility;Decreased  coordination;Decreased cognition;Decreased knowledge of use of DME;Decreased safety awareness;Decreased knowledge of precautions;Cardiopulmonary status limiting activity;Obesity;Pain       PT Treatment Interventions DME instruction;Gait training;Stair training;Functional mobility training;Therapeutic activities;Therapeutic exercise;Balance training;Neuromuscular re-education;Cognitive remediation;Patient/family education;Wheelchair mobility training    PT Goals (Current goals can be found in the Care Plan section)  Acute Rehab PT Goals Patient Stated Goal: none stated PT Goal Formulation: With patient Time For Goal Achievement: 05/04/23 Potential to Achieve Goals: Good    Frequency Min 1X/week     Co-evaluation               AM-PAC PT "6 Clicks" Mobility  Outcome Measure Help needed turning from your back to your side while in a flat bed without using bedrails?: A Marzan Help needed moving from lying on your back to sitting on the side of a flat bed without using bedrails?: A Mcnatt Help needed moving to and from a bed to a chair (including a wheelchair)?: A Martos Help needed standing up from a chair using your arms (e.g., wheelchair or bedside chair)?: A Cedillo Help needed to walk in hospital room?: A Lot Help needed climbing 3-5 steps with a railing? : Total 6 Click Score: 15    End of Session Equipment Utilized During Treatment: Gait belt Activity Tolerance: Patient limited by lethargy Patient left: in chair;with call bell/phone within reach;with  chair alarm set Nurse Communication: Mobility status PT Visit Diagnosis: Unsteadiness on feet (R26.81);Muscle weakness (generalized) (M62.81);Difficulty in walking, not elsewhere classified (R26.2);Other symptoms and signs involving the nervous system (R29.898)    Time: 1610-9604 PT Time Calculation (min) (ACUTE ONLY): 19 min   Charges:   PT Evaluation $PT Eval Moderate Complexity: 1 Mod   PT General Charges $$ ACUTE PT  VISIT: 1 Visit         Kathlyn Sacramento, PT, DPT [facility] Health  Rehabilitation Services Physical Therapist Office: [phone] Website: [email or web address]   Berton Mount 04/20/2023, 1:55 PM

## 2023-04-20 NOTE — [place] FL2 (Signed)
[place] MEDICAID FL2 LEVEL OF CARE FORM     IDENTIFICATION  Patient Name: Shirley [name] Birthdate: [birth year] Sex: female Admission Date (Current Location): 04/15/2023  [facility] and [place] Number:  Producer, television/film/video and Address:  [facility], [address] [place], [place] [address]      Provider Number: [id number]  Attending Physician Name and Address:  Lanae Boast, MD  Relative Name and Phone Number:  Litter,LA-precious (Daughter)  [phone]    Current Level of Care: Hospital Recommended Level of Care: Skilled Nursing Facility Prior Approval Number:    Date Approved/Denied:   PASRR Number: [id number]  Discharge Plan: SNF    Current Diagnoses: Patient Active Problem List   Diagnosis Date Noted   Influenza A 04/16/2023   Elevated troponin 04/16/2023   Influenza 04/16/2023   Atypical chest pain 04/15/2023   CKD (chronic kidney disease) stage 4, GFR 15-29 ml/min (HCC) 08/08/2022   Chronic systolic CHF (congestive heart failure) (HCC) 05/16/2022   Chest pain 03/21/2022   CAD S/P percutaneous coronary angioplasty 03/21/2022   Acute renal failure superimposed on stage 4 chronic kidney disease (HCC) 03/21/2022   Acute pulmonary embolism (HCC) 03/21/2022   Severe sepsis (HCC) 04/17/2021   Conjunctivitis 04/17/2021   Abscess 04/16/2021   Abscess of bursa of right foot    Diabetic foot infection (HCC) 04/06/2020   Cellulitis of foot, right    Puncture wound of right foot    Sepsis due to pneumonia (HCC) 11/24/2019   Thrombocytosis 11/24/2019   Chronic pain 11/24/2019   History of pulmonary embolism 10/10/2017   Chronic anticoagulation 10/10/2017   GERD (gastroesophageal reflux disease) 10/10/2017   Leukocytosis 10/10/2017   Prolonged QT interval 10/10/2017   Nausea & vomiting 09/20/2017   Hypertensive urgency 09/20/2017   Intractable nausea and vomiting 08/08/2017   Chronic maxillary sinusitis 01/02/2017   Acute blood loss  anemia 11/13/2016   Menorrhagia 11/13/2016   Bilateral pulmonary embolism (HCC) 10/30/2016   Acute DVT (deep venous thrombosis) (HCC) 10/30/2016   Nausea and vomiting 09/23/2016   Type 2 diabetes mellitus with hyperglycemia (HCC) 04/07/2016   Diabetic gastroparesis (HCC) 04/06/2016   Dehydration 03/27/2015   Gastroparesis 11/11/2014   Hematemesis 10/13/2014   DKA (diabetic ketoacidosis) (HCC) 10/13/2014   Abdominal pain, chronic, left lower quadrant    Diabetic gastroparesis associated with type 2 diabetes mellitus (HCC) 04/28/2014   History of Helicobacter pylori infection 04/22/2014   Vaginal discharge 02/18/2014   Constipation 07/21/2013   Tinea corporis 07/21/2013   Intractable vomiting 04/29/2013   Dysmenorrhea 04/22/2013   UTI (urinary tract infection) 07/15/2012   Headache 02/08/2012   Health care maintenance 01/22/2012   Chronic hepatitis B (HCC) 03/07/2011   History of leukocytosis 04/06/2010   Disease of blood and blood forming organ 04/06/2010   Polysubstance abuse (HCC) 02/23/2010   Chronic iron deficiency anemia 11/22/2009   PERIPHERAL NEUROPATHY 10/01/2009   HLD (hyperlipidemia) 08/30/2009   Diabetic polyneuropathy (HCC) 08/30/2009   Hidradenitis (recurrent boils) 07/07/2008   Depression 12/27/2007   Abdominal pain, left lower quadrant 11/21/2007   FIBROMYALGIA 10/30/2007   BACK PAIN 04/01/2007   OBSTRUCTIVE SLEEP APNEA 01/17/2007   ANXIETY DEPRESSION 06/27/2006   Chronic ischemic heart disease 06/15/2006   OBESITY, MORBID 05/15/2006   MIGRAINE HEADACHE 05/15/2006   Asthma 05/15/2006   Essential hypertension 01/16/2006   IRREGULAR MENSTRUATION 01/16/2006   PEDAL EDEMA 01/16/2006   DM2 (diabetes mellitus, type 2) (HCC) 01/16/1989    Orientation RESPIRATION BLADDER  Height & Weight     Self, Place  Normal Continent Weight: 237 lb 10.5 oz (107.8 kg) Height:  5\' 7"  (170.2 cm)  BEHAVIORAL SYMPTOMS/MOOD NEUROLOGICAL BOWEL NUTRITION STATUS      Continent  Diet (see DC summary)  AMBULATORY STATUS COMMUNICATION OF NEEDS Skin   Limited Assist Verbally Other (Comment) (Hemodialysis Catheter Right Internal jugular Double lumen Permanent Alfonso Ellis)                       Personal Care Assistance Level of Assistance  Bathing, Feeding, Dressing Bathing Assistance: Maximum assistance Feeding assistance: Limited assistance Dressing Assistance: Maximum assistance     Functional Limitations Info  Sight, Hearing, Speech Sight Info: Adequate Hearing Info: Adequate Speech Info: Adequate    SPECIAL CARE FACTORS FREQUENCY  PT (By licensed PT), OT (By licensed OT)     PT Frequency: 5x/week OT Frequency: 5x/week            Contractures Contractures Info: Not present    Additional Factors Info  Code Status, Allergies Code Status Info: FULL Allergies Info: Morphine And Codeine  Zestril (Lisinopril)           Current Medications (04/20/2023):  This is the current hospital active medication list Current Facility-Administered Medications  Medication Dose Route Frequency Provider Last Rate Last Admin   acetaminophen (TYLENOL) tablet 650 mg  650 mg Oral Q6H PRN Charlott Holler, MD   650 mg at 04/17/23 0943   albuterol (PROVENTIL) (2.5 MG/3ML) 0.083% nebulizer solution 2.5 mg  2.5 mg Nebulization Q4H PRN Charlott Holler, MD       amLODipine (NORVASC) tablet 10 mg  10 mg Oral Daily Charlott Holler, MD   10 mg at 04/20/23 0841   calcium acetate (PHOSLO) capsule 667 mg  667 mg Oral TID WC Annie Sable, MD       carvedilol (COREG) tablet 25 mg  25 mg Oral BID WC Charlott Holler, MD   25 mg at 04/20/23 2956   Chlorhexidine Gluconate Cloth 2 % PADS 6 each  6 each Topical Daily Charlott Holler, MD   6 each at 04/20/23 0843   Chlorhexidine Gluconate Cloth 2 % PADS 6 each  6 each Topical Q0600 Estanislado Emms, MD   6 each at 04/20/23 0524   heparin ADULT infusion 100 units/mL (25000 units/251mL)  700 Units/hr Intravenous Continuous Paytes,  Austin A, RPH 7 mL/hr at 04/20/23 1254 700 Units/hr at 04/20/23 1254   hydrALAZINE (APRESOLINE) injection 10 mg  10 mg Intravenous Q6H PRN Charlott Holler, MD       hydrALAZINE (APRESOLINE) tablet 100 mg  100 mg Oral Q8H Charlott Holler, MD   100 mg at 04/20/23 0522   HYDROcodone-acetaminophen (NORCO/VICODIN) 5-325 MG per tablet 1 tablet  1 tablet Oral Q12H PRN Lanae Boast, MD   1 tablet at 04/20/23 0852   insulin aspart (novoLOG) injection 0-6 Units  0-6 Units Subcutaneous Q4H Charlott Holler, MD   1 Units at 04/20/23 0841   iron sucrose (VENOFER) 200 mg in sodium chloride 0.9 % 100 mL IVPB  200 mg Intravenous Q24H Annie Sable, MD       labetalol (NORMODYNE) injection 5 mg  5 mg Intravenous Q2H PRN Charlott Holler, MD   5 mg at 04/17/23 1810   naloxone [facility]) injection 0.4 mg  0.4 mg Intravenous PRN Charlott Holler, MD   0.2 mg at 04/16/23 1620   ondansetron (ZOFRAN) injection  4 mg  4 mg Intravenous Q6H PRN Charlott Holler, MD   4 mg at 04/16/23 0259   Oral care mouth rinse  15 mL Mouth Rinse PRN Charlott Holler, MD       pantoprazole (PROTONIX) injection 40 mg  40 mg Intravenous Q24H Charlott Holler, MD   40 mg at 04/20/23 0837   sodium chloride flush (NS) 0.9 % injection 3-10 mL  3-10 mL Intravenous Q12H Charlott Holler, MD   10 mL at 04/20/23 0843   sodium chloride flush (NS) 0.9 % injection 3-10 mL  3-10 mL Intravenous PRN Charlott Holler, MD         Discharge Medications: Please see discharge summary for a list of discharge medications.  Relevant Imaging Results:  Relevant Lab Results:   Additional Information [id number] Covid + 04/15/23, Outpatient HD needs to be established, new patient. Droplet precautions, 04/16/23  Nyair Depaulo A [place], LCSWA

## 2023-04-20 NOTE — TOC Progression Note (Signed)
Transition of Care [facility]) - Progression Note    Patient Details  Name: Shirley [name] MRN: [id number] Date of Birth: 1975-07-08  [facility]) CM/SW Contact  Rayne Loiseau A [place], [place] Phone Number: 04/20/2023, 4:30 PM  Clinical Narrative:      CSW spoke with pt via phone, she stated that she was agreeable to recommendation for SNF. SNF workup completed. Bed offers pending. Pt is Covide + on 04/15/23, to be determined if pt's chosen facility can take pt before 10 day quarantine.   TOC will continue to follow.      Expected Discharge Plan and Services                                               Social Determinants of Health (SDOH) Interventions SDOH Screenings   Food Insecurity: No Food Insecurity (04/16/2023)  Housing: Patient Unable To Answer (04/16/2023)  Transportation Needs: Patient Unable To Answer (04/16/2023)  Utilities: Patient Unable To Answer (04/16/2023)  Alcohol Screen: Low Risk  (08/09/2022)  Depression (PHQ2-9): Medium Risk (11/28/2018)  Financial Resource Strain: Low Risk  (08/09/2022)  Tobacco Use: Medium Risk (04/19/2023)    Readmission Risk Interventions    08/15/2022    2:04 PM 05/23/2022   11:50 AM 03/24/2022    3:38 PM  Readmission Risk Prevention Plan  Transportation Screening Complete Complete Complete  Medication Review (RN Care Manager) Complete Complete Referral to Pharmacy  PCP or Specialist appointment within 3-5 days of discharge Complete Complete --  HRI or Home Care Consult Complete Complete Complete  SW Recovery Care/Counseling Consult Complete -- Complete  Palliative Care Screening Not Applicable Not Applicable Not Applicable  Skilled Nursing Facility Not Applicable Not Applicable Not Applicable

## 2023-04-20 NOTE — Progress Notes (Signed)
PHARMACY - ANTICOAGULATION CONSULT NOTE  Pharmacy Consult for heparin Indication: chronic DVT R politeal vein  Allergies  Allergen Reactions   Morphine And Codeine Itching and Swelling   Zestril [Lisinopril] Nausea Only and Swelling    Patient Measurements: Height: 5\' 7"  (170.2 cm) Weight: 107.8 kg (237 lb 10.5 oz) IBW/kg (Calculated) : 61.6 Heparin Dosing Weight: 80.7kg  Vital Signs: Temp: 98 F (36.7 C) (12/27 1527) BP: 130/56 (12/27 1642) Pulse Rate: 73 (12/27 1527)  Labs: Recent Labs    04/18/23 1050 04/18/23 2219 04/19/23 0655 04/20/23 0729 04/20/23 0828 04/20/23 1819  HGB  --   --  9.2*  --  9.0*  --   HCT  --   --  26.8*  --  25.4*  --   PLT  --   --  364  --  317  --   HEPARINUNFRC  --    < > 0.98*  --  >1.10* 0.21*  CREATININE 7.79*  --  8.33* 6.60*  --   --   CKTOTAL 1,657*  --  1,102* 549*  --   --    < > = values in this interval not displayed.    Estimated Creatinine Clearance: 13 mL/min (A) (by C-G formula based on SCr of 6.6 mg/dL (H)).  Assessment: 66 yoF presented to ED with chest pain and shortness of breath. PMH includes: HFrEF (45-50%), CKD Stage 4, CAD s/p PCI, stroke, T2DM, h/o DVT/PE (h/o eliquis >> patient states no longer taking). Pharmacy consulted to dose heparin for history of DVT/PE. Heparin was then changed to SQ heparin 12/24 after VQ scan negative for PE. 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 Pharmacy consulted to restart IV heparin. Last SQ heparin 5000 units given 1330. Hgb down to 8.6, ptl wnl. Noted that pt was subtherapeutic (heparin level 0.19) on 12/22 on heparin 1300 units/hr with SCr 4.7, now SCr up to 7.79.  12/27 AM: Repeat heparin level continues to be supratherapeutic at >1.1 on heparin running at 950 units/hour. CBC stable. No signs of bleeding or issues with hep gtt noted. Heparin level drawn from the opposite side heparin infusion is running.   12/27 PM: heparin level 0.21 ~6hr after resuming  heparin 700 units/hr. No issues noted with the infusion or bleeding reported.  Goal of Therapy:  Heparin level 0.3-0.7 units/ml Monitor platelets by anticoagulation protocol: Yes   Plan:  Increase heparin infusion to 800 units/hour  8h heparin level Daily heparin level and CBC Monitor for s/sx of bleeding F/U transition to oral [facility]   Thank you for involving pharmacy in this patient's care.  Loralee Pacas, PharmD, BCPS Clinical Pharmacist  04/20/2023 7:07 PM

## 2023-04-20 NOTE — Progress Notes (Signed)
[name] Progress Note    Assessment/ Plan:   CKD5, progressed to ESRD -Dr. Malen Gauze discussed with patient's daughter on 12/25 in regards to starting HD and they did consent - HD#1 today early AM , s/p TDC with IR  - renal navigator involved  -tentatively planning on HD#2 tomorrow-  Saturday  -  will need [facility]VS consult early next week for AVF-  from what I see today could not do home dialysis   Severe sepsis Influenza A -per primary service  HTN -UF as tolerated with HD. BP acceptable norvasc 10, coreg 25 BID, hydral 100 tid and PRNs-  hopefully will be able to wean BP meds  Metabolic acidosis - improving  HFrEF -UF as tolerated with HD  DM2 -Per primary  Anemia of CKD -transfuse prn for hgb <7 - Fe panel low, treat with IV iron   Chronic DVT -on heparin gtt -per primary  CKD MBD -renal diet-  add phoslo for binding  -checking PTH (pending)   Subjective:   Had HD early this AM-  removed 500-  labs reflective of HD -  she is lying flat in bed-  moaning-  really unable to verbalize what the issue is-  is sore where TDC is    Objective:   BP (!) 153/80   Pulse 79   Temp 98 F (36.7 C)   Resp 16   Ht 5\' 7"  (1.702 m)   Wt 107.8 kg   LMP 01/22/2021 Comment: neg preg test 04/17/21  SpO2 100%   BMI 37.22 kg/m   Intake/Output Summary (Last 24 hours) at 04/20/2023 1114 Last data filed at 04/20/2023 0300 Gross per 24 hour  Intake 68.04 ml  Output 500 ml  Net -431.96 ml   Weight change: 2.4 kg  Physical Exam: Gen: ill appearing, NAD, drowsy CVS: RRR Resp: decreased breath sounds bibasilar, normal wob Abd: soft Ext: trace to 1+ pitting edema b/l Les Neuro: drowsy, intermittently answering questions  Imaging: IR Fluoro Guide CV Line Right Result Date: 04/20/2023 CLINICAL DATA:  Renal failure requiring dialysis and need for tunneled hemodialysis catheter. EXAM: TUNNELED CENTRAL VENOUS HEMODIALYSIS CATHETER PLACEMENT WITH ULTRASOUND AND  FLUOROSCOPIC GUIDANCE ANESTHESIA/SEDATION: Moderate (conscious) sedation was employed during this procedure. A total of Versed 0.5 mg and Fentanyl 25 mcg was administered intravenously. Moderate Sedation Time: 20 minutes. [name] level of consciousness and vital signs were monitored continuously by radiology nursing throughout the procedure under my direct supervision. MEDICATIONS: 2 g IV Ancef. FLUOROSCOPY: 24 seconds.  5.9 mGy. PROCEDURE: The procedure, risks, benefits, and alternatives were explained to the patient. Questions regarding the procedure were encouraged and answered. The patient understands and consents to the procedure. A timeout was performed prior to initiating the procedure. The right neck and chest were prepped with chlorhexidine in a sterile fashion, and a sterile drape was applied covering the operative field. Maximum barrier sterile technique with sterile gowns and gloves were used for the procedure. Local anesthesia was provided with 1% lidocaine. Ultrasound was used to confirm patency of the right internal jugular vein. An ultrasound image was saved and recorded. After creating a small venotomy incision, a 21 gauge needle was advanced into the right internal jugular vein under direct, real-time ultrasound guidance. Ultrasound image documentation was performed. After securing guidewire access, an 8 Fr dilator was placed. A J-wire was kinked to measure appropriate catheter length. A palindrome tunneled hemodialysis catheter measuring 23 cm from tip to cuff was chosen for placement. This was tunneled  in a retrograde fashion from the chest wall to the venotomy incision. At the venotomy, serial dilatation was performed and a 15 Fr peel-away sheath was placed over a guidewire. The catheter was then placed through the sheath and the sheath removed. Final catheter positioning was confirmed and documented with a fluoroscopic spot image. The catheter was aspirated, flushed with saline, and  injected with appropriate volume heparin dwells. The venotomy incision was closed with subcutaneous and subcuticular 4-0 Vicryl. Dermabond was applied to the incision. The catheter exit site was secured with 0-Prolene retention sutures. COMPLICATIONS: None.  No pneumothorax. FINDINGS: After catheter placement, the tip lies in the right atrium. The catheter aspirates normally and is ready for immediate use. IMPRESSION: Placement of tunneled hemodialysis catheter via the right internal jugular vein. The catheter tip lies in the right atrium. The catheter is ready for immediate use. Electronically Signed   By: Irish Lack M.D.   On: 04/20/2023 08:08   IR US Guide Vasc Access Right Result Date: 04/20/2023 CLINICAL DATA:  Renal failure requiring dialysis and need for tunneled hemodialysis catheter. EXAM: TUNNELED CENTRAL VENOUS HEMODIALYSIS CATHETER PLACEMENT WITH ULTRASOUND AND FLUOROSCOPIC GUIDANCE ANESTHESIA/SEDATION: Moderate (conscious) sedation was employed during this procedure. A total of Versed 0.5 mg and Fentanyl 25 mcg was administered intravenously. Moderate Sedation Time: 20 minutes. [name] level of consciousness and vital signs were monitored continuously by radiology nursing throughout the procedure under my direct supervision. MEDICATIONS: 2 g IV Ancef. FLUOROSCOPY: 24 seconds.  5.9 mGy. PROCEDURE: The procedure, risks, benefits, and alternatives were explained to the patient. Questions regarding the procedure were encouraged and answered. The patient understands and consents to the procedure. A timeout was performed prior to initiating the procedure. The right neck and chest were prepped with chlorhexidine in a sterile fashion, and a sterile drape was applied covering the operative field. Maximum barrier sterile technique with sterile gowns and gloves were used for the procedure. Local anesthesia was provided with 1% lidocaine. Ultrasound was used to confirm patency of the right internal  jugular vein. An ultrasound image was saved and recorded. After creating a small venotomy incision, a 21 gauge needle was advanced into the right internal jugular vein under direct, real-time ultrasound guidance. Ultrasound image documentation was performed. After securing guidewire access, an 8 Fr dilator was placed. A J-wire was kinked to measure appropriate catheter length. A palindrome tunneled hemodialysis catheter measuring 23 cm from tip to cuff was chosen for placement. This was tunneled in a retrograde fashion from the chest wall to the venotomy incision. At the venotomy, serial dilatation was performed and a 15 Fr peel-away sheath was placed over a guidewire. The catheter was then placed through the sheath and the sheath removed. Final catheter positioning was confirmed and documented with a fluoroscopic spot image. The catheter was aspirated, flushed with saline, and injected with appropriate volume heparin dwells. The venotomy incision was closed with subcutaneous and subcuticular 4-0 Vicryl. Dermabond was applied to the incision. The catheter exit site was secured with 0-Prolene retention sutures. COMPLICATIONS: None.  No pneumothorax. FINDINGS: After catheter placement, the tip lies in the right atrium. The catheter aspirates normally and is ready for immediate use. IMPRESSION: Placement of tunneled hemodialysis catheter via the right internal jugular vein. The catheter tip lies in the right atrium. The catheter is ready for immediate use. Electronically Signed   By: Irish Lack M.D.   On: 04/20/2023 08:08   VAS [place] UPPER EXT VEIN MAPPING (PRE-OP AVF) Result Date: 04/19/2023  UPPER EXTREMITY VEIN MAPPING Patient Name:  Shirley [name]  Date of Exam:   04/19/2023 Medical Rec #: [id number]         Accession #:    [id number] Date of Birth: October 07, 1975         Patient Gender: F Patient Age:   47 years Exam Location:  [facility] Procedure:      VAS [place] UPPER EXT VEIN MAPPING (PRE-OP AVF)  Referring Phys: Vallery Sa --------------------------------------------------------------------------------  Indications: Pre-access. Performing Technologist: Fernande Bras  Examination Guidelines: A complete evaluation includes B-mode imaging, spectral Doppler, color Doppler, and power Doppler as needed of all accessible portions of each vessel. Bilateral testing is considered an integral part of a complete examination. Limited examinations for reoccurring indications may be performed as noted. +-----------------+-------------+----------+---------+ Right Cephalic   Diameter (cm)Depth (cm)Findings  +-----------------+-------------+----------+---------+ Shoulder             0.24        1.90             +-----------------+-------------+----------+---------+ Prox upper arm       0.24        1.43             +-----------------+-------------+----------+---------+ Mid upper arm        0.18        0.69   branching +-----------------+-------------+----------+---------+ Dist upper arm       0.22        0.22   Thrombus  +-----------------+-------------+----------+---------+ Antecubital fossa    0.21        0.26   Thrombus  +-----------------+-------------+----------+---------+ Prox forearm         0.15        0.39   branching +-----------------+-------------+----------+---------+ Mid forearm          0.09        0.17             +-----------------+-------------+----------+---------+ Dist forearm         0.10        0.40             +-----------------+-------------+----------+---------+ Wrist                0.07        0.27             +-----------------+-------------+----------+---------+ +-----------------+-------------+----------+--------------+ Right Basilic    Diameter (cm)Depth (cm)   Findings    +-----------------+-------------+----------+--------------+ Mid upper arm        0.37                               +-----------------+-------------+----------+--------------+ Dist upper arm       0.21                              +-----------------+-------------+----------+--------------+ Antecubital fossa    0.12                              +-----------------+-------------+----------+--------------+ Prox forearm                            not visualized +-----------------+-------------+----------+--------------+ Mid forearm                             not  visualized +-----------------+-------------+----------+--------------+ Distal forearm                          not visualized +-----------------+-------------+----------+--------------+ Wrist                                   not visualized +-----------------+-------------+----------+--------------+ +-----------------+-------------+----------+-----------------+ Left Cephalic    Diameter (cm)Depth (cm)    Findings      +-----------------+-------------+----------+-----------------+ Shoulder             0.27        2.00                     +-----------------+-------------+----------+-----------------+ Prox upper arm       0.27        1.61                     +-----------------+-------------+----------+-----------------+ Mid upper arm        0.17        0.89       branching     +-----------------+-------------+----------+-----------------+ Dist upper arm       0.18        0.21                     +-----------------+-------------+----------+-----------------+ Antecubital fossa    0.26        0.22       Thrombus      +-----------------+-------------+----------+-----------------+ Prox forearm         0.29        0.31       Thrombus      +-----------------+-------------+----------+-----------------+ Mid forearm          0.28        0.33   Thrombus/catheter +-----------------+-------------+----------+-----------------+ Dist forearm         0.11        0.35                      +-----------------+-------------+----------+-----------------+ Wrist                0.17        0.21                     +-----------------+-------------+----------+-----------------+ +-----------------+-------------+----------+--------------+ Left Basilic     Diameter (cm)Depth (cm)   Findings    +-----------------+-------------+----------+--------------+ Dist upper arm       0.16                              +-----------------+-------------+----------+--------------+ Antecubital fossa                       not visualized +-----------------+-------------+----------+--------------+ Prox forearm         0.11                              +-----------------+-------------+----------+--------------+ Mid forearm                             not visualized +-----------------+-------------+----------+--------------+ Distal forearm       0.07                              +-----------------+-------------+----------+--------------+  Wrist                0.09                              +-----------------+-------------+----------+--------------+ Limited study due to edema, patient's body habitus and inability to properly position. *See table(s) above for measurements and observations.  Diagnosing physician: Gerarda Fraction Electronically signed by Gerarda Fraction on 04/19/2023 at 4:21:55 PM.    Final    US RENAL Result Date: 04/18/2023 CLINICAL DATA:  Acute kidney injury Anuria EXAM: RENAL / URINARY TRACT ULTRASOUND COMPLETE COMPARISON:  08/11/2022 FINDINGS: Right Kidney: Renal measurements: 9.6 x 5.1 x 5.6 cm = volume: 145 mL. Mild increased echogenicity the renal cortex without significant thinning. No mass or hydronephrosis visualized. Left Kidney: Renal measurements: 10.0 x 4.9 x 5.5 cm = volume: 140 mL. Mild increased echogenicity the renal cortex without significant thinning. No mass or hydronephrosis visualized. Bladder: Appears normal for degree of bladder distention. Other: None.  IMPRESSION: Unchanged mild increased echogenicity of the renal cortices without significant thinning, consistent with chronic medical renal disease. No hydronephrosis. Electronically Signed   By: Acquanetta Belling M.D.   On: 04/18/2023 13:36    Labs: BMET Recent Labs  Lab 04/15/23 2122 04/16/23 0800 04/16/23 1603 04/17/23 0804 04/18/23 1050 04/19/23 0655 04/20/23 0729  NA 140 138 138 140 137 138 134*  K 4.5 4.6 4.2 4.6 4.5 4.6 4.1  CL 112* 111 113* 113* 112* 112* 104  CO2 17* 17* 14* 13* 16* 13* 17*  GLUCOSE 105* 155* 120* 142* 118* 101* 145*  BUN 42* 44* 46* 58* 75* 85* 67*  CREATININE 4.51* 4.73* 5.26* 6.51* 7.79* 8.33* 6.60*  CALCIUM 8.6* 8.6* 8.3* 8.5* 8.2* 8.2* 7.5*  PHOS  --  4.8*  --   --  7.5* 7.9* 6.7*   CBC Recent Labs  Lab 04/15/23 2122 04/16/23 0800 04/17/23 0804 04/19/23 0655 04/20/23 0828  WBC 17.5* 15.7* 11.1* 13.9* 11.9*  NEUTROABS 14.5* 14.1*  --   --   --   HGB 9.4* 10.5* 8.6* 9.2* 9.0*  HCT 28.1* 31.0* 25.5* 26.8* 25.4*  MCV 79.8* 80.1 79.4* 77.5* 76.3*  PLT 390 411* 335 364 317    Medications:     amLODipine  10 mg Oral Daily   carvedilol  25 mg Oral BID WC   Chlorhexidine Gluconate Cloth  6 each Topical Daily   Chlorhexidine Gluconate Cloth  6 each Topical Q0600   hydrALAZINE  100 mg Oral Q8H   insulin aspart  0-6 Units Subcutaneous Q4H   pantoprazole (PROTONIX) IV  40 mg Intravenous Q24H   sodium bicarbonate  650 mg Oral Daily   sodium chloride flush  3-10 mL Intravenous Q12H      Jennette Kettle Diya Gervasi  [employer] 04/20/2023, 11:14 AM

## 2023-04-20 NOTE — Progress Notes (Addendum)
Spoke to pt via phone this morning. Introduced self and explained role. Pt having difficulty answering basic questions and pt felt it best for navigator to call back later. Attempted to reach pt's daughter via phone. Message left requesting a return call. Will continue efforts to speak with pt/family regarding out-pt HD needs at d/c. Will assist as needed.   Shirley [name] Renal Navigator [phone]  Addendum at 11:23 am: Received a return call from pt's daughter, Shirley [name]. Introduced self and explained role. Pt lives in [place] and family plan to assist with transportation to HD appts at d/c. Family prefers clinic close to pt's home. Referral submitted to [facility] admissions this morning for review.

## 2023-04-20 NOTE — Progress Notes (Signed)
   04/20/23 0300  Vitals  Temp 98 F (36.7 C)  Temp Source Oral  BP 135/87  MAP (mmHg) 101  BP Location Right Arm  BP Method Automatic  Patient Position (if appropriate) Lying  Pulse Rate 71  Pulse Rate Source Monitor  ECG Heart Rate 72  Resp 12  Oxygen Therapy  SpO2 99 %  O2 Device Nasal Cannula  O2 Flow Rate (L/min) 2 L/min  During Treatment Monitoring  Blood Flow Rate (mL/min) 0 mL/min  Arterial Pressure (mmHg) 8.68 mmHg  Venous Pressure (mmHg) -24.44 mmHg  TMP (mmHg) 15.55 mmHg  Ultrafiltration Rate (mL/min) 401 mL/min  Dialysate Flow Rate (mL/min) 300 ml/min  Dialysate Potassium Concentration 2  Dialysate Calcium Concentration 2.5  Duration of HD Treatment -hour(s) 2 hour(s)  Cumulative Fluid Removed (mL) per Treatment  500.08  HD Safety Checks Performed Yes  Intra-Hemodialysis Comments Tx completed  Post Treatment  Dialyzer Clearance Lightly streaked  Liters Processed 24  Fluid Removed (mL) 500 mL  Tolerated HD Treatment Yes  Post-Hemodialysis Comments treatment complete  Hemodialysis Catheter Right Internal jugular Double lumen Permanent (Tunneled)  Placement Date/Time: 04/19/23 1604   Serial / Lot #: [id number]  Expiration Date: 09/22/27  Time Out: Correct patient;Correct site;Correct procedure  Maximum sterile barrier precautions: Hand hygiene;Cap;Mask;Sterile gown;Sterile gloves;Large sterile s...  Site Condition No complications  Blue Lumen Status Saline locked  Red Lumen Status Saline locked  Dressing Type Transparent  Dressing Status Antimicrobial disc in place;Clean, Dry, Intact  Drainage Description None  Dressing Change Due 03/27/23  Post treatment catheter status Capped and Clamped   Pt tolerated first treatment without complication.

## 2023-04-20 NOTE — Progress Notes (Signed)
PHARMACY - ANTICOAGULATION CONSULT NOTE  Pharmacy Consult for heparin Indication: chronic DVT R politeal vein  Allergies  Allergen Reactions   Morphine And Codeine Itching and Swelling   Zestril [Lisinopril] Nausea Only and Swelling    Patient Measurements: Height: 5\' 7"  (170.2 cm) Weight: 107.8 kg (237 lb 10.5 oz) IBW/kg (Calculated) : 61.6 Heparin Dosing Weight: 80.7kg  Vital Signs: Temp: 97.5 F (36.4 C) (12/27 0439) Temp Source: Oral (12/27 0439) BP: 139/85 (12/27 0439) Pulse Rate: 75 (12/27 0439)  Labs: Recent Labs    04/17/23 0804 04/18/23 1050 04/18/23 2219 04/19/23 0655  HGB 8.6*  --   --  9.2*  HCT 25.5*  --   --  26.8*  PLT 335  --   --  364  HEPARINUNFRC  --   --  0.85* 0.98*  CREATININE 6.51* 7.79*  --  8.33*  CKTOTAL 2,213* 1,657*  --  1,102*    Estimated Creatinine Clearance: 10.3 mL/min (A) (by C-G formula based on SCr of 8.33 mg/dL (H)).  Assessment: 9 yoF presented to ED with chest pain and shortness of breath. PMH includes: HFrEF (45-50%), CKD Stage 4, CAD s/p PCI, stroke, T2DM, h/o DVT/PE (h/o eliquis >> patient states no longer taking). Pharmacy consulted to dose heparin for history of DVT/PE. Heparin was then changed to SQ heparin 12/24 after VQ scan negative for PE. 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 Pharmacy consulted to restart IV heparin. Last SQ heparin 5000 units given 1330. Hgb down to 8.6, ptl wnl. Noted that pt was subtherapeutic (heparin level 0.19) on 12/22 on heparin 1300 units/hr with SCr 4.7, now SCr up to 7.79.  12/27 AM: Repeat heparin level continues to be supratherapeutic at >1.1 on heparin running at 950 units/hour. CBC stable. No signs of bleeding or issues with hep gtt noted. Heparin level drawn from the opposite side heparin infusion is running.   Goal of Therapy:  Heparin level 0.3-0.7 units/ml Monitor platelets by anticoagulation protocol: Yes   Plan:  Hold heparin for 1 hour, then decrease  heparin infusion to 700 units/hour  8h heparin level Daily heparin level and CBC Monitor for s/sx of bleeding F/U transition to oral AC   Thank you for involving pharmacy in this patient's care.  Jani Gravel, PharmD Clinical Pharmacist  04/20/2023 7:09 AM

## 2023-04-20 NOTE — Progress Notes (Signed)
PROGRESS NOTE Shirley [name]  WUJ:[id number] DOB: October 29, 1975 DOA: 04/15/2023 PCP: Bettey Costa, NP  Brief Narrative/Hospital Course: Shirley [name] is a 47 y.o. female with a history of CKD stage IV, hypertension, HFmrEF, CAD s/p PCI, stroke, OSA, recurrent VTE.  Patient presented secondary to flu-like symptoms, pleuritic chest pain and chest heaviness found to have influenza A infection. Patient started on Tamiflu for treatment. Hospitalization complicated by acute metabolic encephalopathy in addition to hypotension, requiring a one day admission to ICU. Although encephalopathy and hypotension improved, patient developed worsening AKI on underlying renal disease in addition to anuria/oliguria. 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   Subjective: Seen and examined Bit drowsy- in pain- able to answer questions C/o being sore in chest at [facility] site Overnight afebrile,BP stable on room air.  CBG stable.   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   Avoid nephrotoxic medications including NSAIDs and iodinated intravenous contrast exposure unless the latter is absolutely indicated.Preferred narcotic agents for pain control NFA:[id number], fentanyl, and methadone and  avoid morphine.Avoid Baclofen and avoid oral sodium phosphate and magnesium citrate based laxatives / bowel preps. Continue strict Input and Output monitoring and serial renal functions. Recent Labs    08/15/22 0141 08/28/22 1418 09/15/22 1405 04/15/23 2122 04/16/23 0800 04/16/23 1603 04/17/23 0804 04/18/23 1050 04/19/23 0655 04/20/23 0729  BUN 55* 41* 48* 42* 44* 46* 58* 75* 85* 67*  CREATININE 3.75* 2.69* 2.80* 4.51* 4.73* 5.26* 6.51* 7.79* 8.33* 6.60*  CO2 23 19* 22 17* 17* 14* 13* 16* 13* 17*  K 4.3 4.1 4.2 4.5 4.6 4.2 4.6 4.5 4.6 4.1    Metabolic acidosis: Due to ESRD continue oral bicarb-and stop per nephro after HD.  CKD-MBD-continue renal diet monitor PTH  Anemia of CKD: Monitor and transfuse for less than 7 g, follow-up iron panel  Severe sepsis due to influenza Influenza A Infection: Continue Tamiflu, pharmacy to dose.  Blood culture no growth so far  Acute metabolic encephalopathy Unclear etiology but presumed secondary to sepsis and complicated by underlying renal disease.  Much more alert awake, continue supportive care.   Cont HD   Hypotension Concerned this was secondary to volume depletion and was give IV fluids while in ICU.  Currently BP trending up.     HFrEF LVEF of 45-50% from 02/2022.  With evidence of lower leg edema but in the setting of fluid overload from worsening renal function.  Continue HD and address fluid status    Chest pain-resolved Chest x-ray clear. D-dimer elevated at 1.95. V/Q scan obtained and was negative for acute PE.   chronic DVT Lower extremity venous duplex on 12/24 revealed a right popliteal vein chronic DVT.  On heparin hopefully transition to Eliquis once no more procedures planned  Diabetes mellitus type 2 Well-controlled A1c, continue SSI and monitor CBG as below w Recent Labs  Lab 04/16/23 0545 04/16/23 0805 04/19/23 1723 04/19/23 2019 04/20/23 0430 04/20/23 0750 04/20/23 1142  GLUCAP  --    < > 99 103* 106*  183* 134*  HGBA1C 6.5*  --   --   --   --   --   --    < > = values in this interval not displayed.    Gastroparesis Noted history.   CAD Angina Stable. on Imdur, Lipitor and aspirin as an outpatient which were held.   Primary hypertension BP currently controlled on amlodipine 10 and Coreg 25mg .   History of CVA Noted. Patient is on aspirin and Lipitor which are held.    Obesity w/ OSA: Patient's Body mass index is 37.22 kg/m. : Will benefit with PCP follow-up, weight loss  healthy lifestyle and outpatient sleep evaluation.start  Cpap at [facility].   DVT prophylaxis:  Code Status:   Code Status: Full Code Family Communication: plan of care discussed with patient at bedside. Patient status is: Remains hospitalized because of severity of illness Level of care: Med-Surg   Dispo: The patient is from: home with boyfriend            Anticipated disposition: TBD Objective: Vitals last 24 hrs: Vitals:   04/20/23 0439 04/20/23 0449 04/20/23 0751 04/20/23 0838  BP: 139/85  127/68 (!) 153/80  Pulse: 75  80 79  Resp: 18  16   Temp: (!) 97.5 F (36.4 C)  98 F (36.7 C)   TempSrc: Oral     SpO2: 98%  100%   Weight:  107.8 kg    Height:       Weight change: 2.4 kg  Physical Examination: General exam: alert awake, mildly lethargic HEENT:Oral mucosa moist, Ear/Nose WNL grossly Respiratory system: Bilaterally clear BS,no use of accessory muscle, TDC+ C/D/I Cardiovascular system: S1 & S2 +, No JVD. Gastrointestinal system: Abdomen soft,NT,ND, BS+ Nervous System: Alert, awake, moving all extremities,and following commands. Extremities: LE edema neg,distal peripheral pulses palpable and warm.  Skin: No rashes,no icterus. MSK: Normal muscle bulk,tone, power   Medications reviewed: Scheduled Meds:  amLODipine  10 mg Oral Daily   calcium acetate  667 mg Oral TID WC   carvedilol  25 mg Oral BID WC   Chlorhexidine Gluconate Cloth  6 each Topical Daily   Chlorhexidine Gluconate  Cloth  6 each Topical Q0600   hydrALAZINE  100 mg Oral Q8H   insulin aspart  0-6 Units Subcutaneous Q4H   pantoprazole (PROTONIX) IV  40 mg Intravenous Q24H   sodium chloride flush  3-10 mL Intravenous Q12H  Continuous Infusions:  heparin 700 Units/hr (04/20/23 1254)   iron sucrose      Diet Order             Diet renal with fluid restriction Fluid restriction: 1200 mL Fluid; Room service appropriate? Yes; Fluid consistency: Thin  Diet effective now                  Intake/Output Summary (Last 24 hours) at 04/20/2023 1330 Last data filed at 04/20/2023 0300 Gross per 24 hour  Intake 68.04 ml  Output 500 ml  Net -431.96 ml   Net IO Since Admission: 2,406.49 mL [04/20/23 1330]  Wt Readings from Last 3 Encounters:  04/20/23 107.8 kg  12/28/22 90.7 kg  09/28/22 90.7 kg     Unresulted Labs (From admission, onward)  Start     Ordered   04/20/23 1830  Heparin level (unfractionated)  Once-Timed,   TIMED       Question:  Specimen collection method  Answer:  Lab=Lab collect   04/20/23 1131   04/20/23 0500  CBC  Daily,   R     Question:  Specimen collection method  Answer:  Lab=Lab collect   04/18/23 2253   04/20/23 0500  Heparin level (unfractionated)  Daily,   R     Question:  Specimen collection method  Answer:  Lab=Lab collect   04/19/23 1838   04/19/23 0640  Renal function panel  Daily,   R     Question:  Specimen collection method  Answer:  Lab=Lab collect   04/19/23 0639   04/18/23 1017  CK  Daily,   R     Question:  Specimen collection method  Answer:  Lab=Lab collect   04/18/23 1016   04/16/23 1703  Rapid urine drug screen (hospital performed)  Add-on,   AD        04/16/23 1702   Unscheduled  Occult blood card to lab, stool  As needed,   R      04/18/23 1740          Data Reviewed: I have personally reviewed following labs and imaging studies CBC: Recent Labs  Lab 04/15/23 2122 04/16/23 0800 04/17/23 0804 04/19/23 0655 04/20/23 0828  WBC 17.5*  15.7* 11.1* 13.9* 11.9*  NEUTROABS 14.5* 14.1*  --   --   --   HGB 9.4* 10.5* 8.6* 9.2* 9.0*  HCT 28.1* 31.0* 25.5* 26.8* 25.4*  MCV 79.8* 80.1 79.4* 77.5* 76.3*  PLT 390 411* 335 364 317   Basic Metabolic Panel:  Recent Labs  Lab 04/15/23 2327 04/16/23 0800 04/16/23 1603 04/17/23 0804 04/18/23 1050 04/19/23 0655 04/20/23 0729  NA  --  138 138 140 137 138 134*  K  --  4.6 4.2 4.6 4.5 4.6 4.1  CL  --  111 113* 113* 112* 112* 104  CO2  --  17* 14* 13* 16* 13* 17*  GLUCOSE  --  155* 120* 142* 118* 101* 145*  BUN  --  44* 46* 58* 75* 85* 67*  CREATININE  --  4.73* 5.26* 6.51* 7.79* 8.33* 6.60*  CALCIUM  --  8.6* 8.3* 8.5* 8.2* 8.2* 7.5*  MG 1.9 1.9  --   --   --   --   --   PHOS  --  4.8*  --   --  7.5* 7.9* 6.7*   GFR: Estimated Creatinine Clearance: 13 mL/min (A) (by C-G formula based on SCr of 6.6 mg/dL (H)). Liver Function Tests:  Recent Labs  Lab 04/15/23 2122 04/16/23 0800 04/16/23 1603 04/18/23 1050 04/19/23 0655 04/20/23 0729  AST 38 35 26  --   --   --   ALT 14 15 11   --   --   --   ALKPHOS 64 66 53  --   --   --   BILITOT 1.2* 0.5 0.6  --   --   --   PROT 7.0 7.5 6.0*  --   --   --   ALBUMIN 2.5* 2.6* 2.1* 2.3* 2.3* 2.0*   Recent Labs  Lab 04/16/23 0800  LIPASE 24  No results for input(s): "AMMONIA" in the last 168 hours. Recent Labs  Lab 04/16/23 0216 04/16/23 0417 04/16/23 0551 04/16/23 1603 04/16/23 1902  PROCALCITON 0.12  --   --   --  0.33  LATICACIDVEN  --  0.6 0.5 1.3 1.2   Recent Results (from the past 240 hours)  Resp panel by RT-PCR (RSV, Flu A&B, Covid) Anterior Nasal Swab     Status: Abnormal   Collection Time: 04/15/23  9:23 PM   Specimen: Anterior Nasal Swab  Result Value Ref Range Status   SARS Coronavirus 2 by RT PCR NEGATIVE NEGATIVE Final   Influenza A by PCR POSITIVE (A) NEGATIVE Final   Influenza B by PCR NEGATIVE NEGATIVE Final    Comment: (NOTE) The Xpert Xpress SARS-CoV-2/FLU/RSV plus assay is intended as an aid in the  diagnosis of influenza from Nasopharyngeal swab specimens and should not be used as a sole basis for treatment. Nasal washings and aspirates are unacceptable for Xpert Xpress SARS-CoV-2/FLU/RSV testing.  Fact Sheet for Patients: BloggerCourse.com  Fact Sheet for Healthcare Providers: SeriousBroker.it  This test is not yet approved or cleared by the [place] FDA and has been authorized for detection and/or diagnosis of SARS-CoV-2 by FDA under an Emergency Use Authorization (EUA). This EUA will remain in effect (meaning this test can be used) for the duration of the COVID-19 declaration under Section 564(b)(1) of the Act, 21 U.S.C. section 360bbb-3(b)(1), unless the authorization is terminated or revoked.     Resp Syncytial Virus by PCR NEGATIVE NEGATIVE Final    Comment: (NOTE) Fact Sheet for Patients: BloggerCourse.com  Fact Sheet for Healthcare Providers: SeriousBroker.it  This test is not yet approved or cleared by the [place] FDA and has been authorized for detection and/or diagnosis of SARS-CoV-2 by FDA under an Emergency Use Authorization (EUA). This EUA will remain in effect (meaning this test can be used) for the duration of the COVID-19 declaration under Section 564(b)(1) of the Act, 21 U.S.C. section 360bbb-3(b)(1), unless the authorization is terminated or revoked.  Performed at [facility], [address], [place], [place] [address]   Culture, blood (Routine X 2) w Reflex to ID Panel     Status: None (Preliminary result)   Collection Time: 04/16/23  2:16 AM   Specimen: BLOOD  Result Value Ref Range Status   Specimen Description BLOOD SITE NOT SPECIFIED  Final   Special Requests   Final    BOTTLES DRAWN AEROBIC AND ANAEROBIC Blood Culture adequate volume   Culture   Final    NO GROWTH 4 DAYS Performed at [facility], [address]  [place], [place] [address]    Report Status PENDING  Incomplete  Culture, blood (Routine X 2) w Reflex to ID Panel     Status: None (Preliminary result)   Collection Time: 04/16/23  2:26 AM   Specimen: BLOOD  Result Value Ref Range Status   Specimen Description BLOOD SITE NOT SPECIFIED  Final   Special Requests   Final    BOTTLES DRAWN AEROBIC AND ANAEROBIC Blood Culture results may not be optimal due to an inadequate volume of blood received in culture bottles   Culture   Final    NO GROWTH 4 DAYS Performed at [facility], [address] [place], [place] [address]    Report Status PENDING  Incomplete  MRSA Next Gen by PCR, Nasal     Status: None   Collection Time: 04/16/23  5:07 PM   Specimen: Nasal Mucosa; Nasal Swab  Result Value Ref Range Status   MRSA by PCR Next Gen NOT DETECTED NOT DETECTED Final    Comment: (NOTE) The GeneXpert MRSA Assay (FDA approved  for NASAL specimens only), is one component of a comprehensive MRSA colonization surveillance program. It is not intended to diagnose MRSA infection nor to guide or monitor treatment for MRSA infections. Test performance is not FDA approved in patients less than 58 years old. Performed at [facility], [address] [place], [place] [address]     Antimicrobials/Microbiology: Anti-infectives (From admission, onward)    Start     Dose/Rate Route Frequency Ordered Stop   04/19/23 1550  ceFAZolin (ANCEF) IVPB 2g/100 mL premix        over 30 Minutes Intravenous Continuous PRN 04/19/23 1550 04/19/23 1613   04/19/23 1115  ceFAZolin (ANCEF) IVPB 2g/100 mL premix       Note to Pharmacy: TO BE GIVEN IR   2 g 200 mL/hr over 30 Minutes Intravenous To Radiology 04/19/23 1022 04/19/23 1643   04/19/23 1000  oseltamivir (TAMIFLU) capsule 30 mg        30 mg Oral  Once 04/19/23 0641 04/19/23 0831   04/16/23 1300  oseltamivir (TAMIFLU) capsule 30 mg  Status:  Discontinued        30 mg Oral Daily 04/16/23 1252 04/18/23 1403          Component Value Date/Time   SDES BLOOD SITE NOT SPECIFIED 04/16/2023 0226   SPECREQUEST  04/16/2023 0226    BOTTLES DRAWN AEROBIC AND ANAEROBIC Blood Culture results may not be optimal due to an inadequate volume of blood received in culture bottles   CULT  04/16/2023 0226    NO GROWTH 4 DAYS Performed at [facility], [address] [place], [place] [address]    REPTSTATUS PENDING 04/16/2023 5621     Radiology Studies: IR Fluoro Guide CV Line Right Result Date: 04/20/2023 CLINICAL DATA:  Renal failure requiring dialysis and need for tunneled hemodialysis catheter. EXAM: TUNNELED CENTRAL VENOUS HEMODIALYSIS CATHETER PLACEMENT WITH ULTRASOUND AND FLUOROSCOPIC GUIDANCE ANESTHESIA/SEDATION: Moderate (conscious) sedation was employed during this procedure. A total of Versed 0.5 mg and Fentanyl 25 mcg was administered intravenously. Moderate Sedation Time: 20 minutes. [name] level of consciousness and vital signs were monitored continuously by radiology nursing throughout the procedure under my direct supervision. MEDICATIONS: 2 g IV Ancef. FLUOROSCOPY: 24 seconds.  5.9 mGy. PROCEDURE: The procedure, risks, benefits, and alternatives were explained to the patient. Questions regarding the procedure were encouraged and answered. The patient understands and consents to the procedure. A timeout was performed prior to initiating the procedure. The right neck and chest were prepped with chlorhexidine in a sterile fashion, and a sterile drape was applied covering the operative field. Maximum barrier sterile technique with sterile gowns and gloves were used for the procedure. Local anesthesia was provided with 1% lidocaine. Ultrasound was used to confirm patency of the right internal jugular vein. An ultrasound image was saved and recorded. After creating a small venotomy incision, a 21 gauge needle was advanced into the right internal jugular vein under direct, real-time ultrasound  guidance. Ultrasound image documentation was performed. After securing guidewire access, an 8 Fr dilator was placed. A J-wire was kinked to measure appropriate catheter length. A palindrome tunneled hemodialysis catheter measuring 23 cm from tip to cuff was chosen for placement. This was tunneled in a retrograde fashion from the chest wall to the venotomy incision. At the venotomy, serial dilatation was performed and a 15 Fr peel-away sheath was placed over a guidewire. The catheter was then placed through the sheath and the sheath removed. Final catheter positioning was  confirmed and documented with a fluoroscopic spot image. The catheter was aspirated, flushed with saline, and injected with appropriate volume heparin dwells. The venotomy incision was closed with subcutaneous and subcuticular 4-0 Vicryl. Dermabond was applied to the incision. The catheter exit site was secured with 0-Prolene retention sutures. COMPLICATIONS: None.  No pneumothorax. FINDINGS: After catheter placement, the tip lies in the right atrium. The catheter aspirates normally and is ready for immediate use. IMPRESSION: Placement of tunneled hemodialysis catheter via the right internal jugular vein. The catheter tip lies in the right atrium. The catheter is ready for immediate use. Electronically Signed   By: Irish Lack M.D.   On: 04/20/2023 08:08   IR US Guide Vasc Access Right Result Date: 04/20/2023 CLINICAL DATA:  Renal failure requiring dialysis and need for tunneled hemodialysis catheter. EXAM: TUNNELED CENTRAL VENOUS HEMODIALYSIS CATHETER PLACEMENT WITH ULTRASOUND AND FLUOROSCOPIC GUIDANCE ANESTHESIA/SEDATION: Moderate (conscious) sedation was employed during this procedure. A total of Versed 0.5 mg and Fentanyl 25 mcg was administered intravenously. Moderate Sedation Time: 20 minutes. [name] level of consciousness and vital signs were monitored continuously by radiology nursing throughout the procedure under my direct  supervision. MEDICATIONS: 2 g IV Ancef. FLUOROSCOPY: 24 seconds.  5.9 mGy. PROCEDURE: The procedure, risks, benefits, and alternatives were explained to the patient. Questions regarding the procedure were encouraged and answered. The patient understands and consents to the procedure. A timeout was performed prior to initiating the procedure. The right neck and chest were prepped with chlorhexidine in a sterile fashion, and a sterile drape was applied covering the operative field. Maximum barrier sterile technique with sterile gowns and gloves were used for the procedure. Local anesthesia was provided with 1% lidocaine. Ultrasound was used to confirm patency of the right internal jugular vein. An ultrasound image was saved and recorded. After creating a small venotomy incision, a 21 gauge needle was advanced into the right internal jugular vein under direct, real-time ultrasound guidance. Ultrasound image documentation was performed. After securing guidewire access, an 8 Fr dilator was placed. A J-wire was kinked to measure appropriate catheter length. A palindrome tunneled hemodialysis catheter measuring 23 cm from tip to cuff was chosen for placement. This was tunneled in a retrograde fashion from the chest wall to the venotomy incision. At the venotomy, serial dilatation was performed and a 15 Fr peel-away sheath was placed over a guidewire. The catheter was then placed through the sheath and the sheath removed. Final catheter positioning was confirmed and documented with a fluoroscopic spot image. The catheter was aspirated, flushed with saline, and injected with appropriate volume heparin dwells. The venotomy incision was closed with subcutaneous and subcuticular 4-0 Vicryl. Dermabond was applied to the incision. The catheter exit site was secured with 0-Prolene retention sutures. COMPLICATIONS: None.  No pneumothorax. FINDINGS: After catheter placement, the tip lies in the right atrium. The catheter aspirates  normally and is ready for immediate use. IMPRESSION: Placement of tunneled hemodialysis catheter via the right internal jugular vein. The catheter tip lies in the right atrium. The catheter is ready for immediate use. Electronically Signed   By: Irish Lack M.D.   On: 04/20/2023 08:08   VAS [place] UPPER EXT VEIN MAPPING (PRE-OP AVF) Result Date: 04/19/2023 UPPER EXTREMITY VEIN MAPPING Patient Name:  Shirley [name] Brazier  Date of Exam:   04/19/2023 Medical Rec #: [id number]         Accession #:    [id number] Date of Birth: 11/14/1975  Patient Gender: F Patient Age:   52 years Exam Location:  [facility] Procedure:      VAS [place] UPPER EXT VEIN MAPPING (PRE-OP AVF) Referring Phys: Lawson Fiscal FOSTER --------------------------------------------------------------------------------  Indications: Pre-access. Performing Technologist: Fernande Bras  Examination Guidelines: A complete evaluation includes B-mode imaging, spectral Doppler, color Doppler, and power Doppler as needed of all accessible portions of each vessel. Bilateral testing is considered an integral part of a complete examination. Limited examinations for reoccurring indications may be performed as noted. +-----------------+-------------+----------+---------+ Right Cephalic   Diameter (cm)Depth (cm)Findings  +-----------------+-------------+----------+---------+ Shoulder             0.24        1.90             +-----------------+-------------+----------+---------+ Prox upper arm       0.24        1.43             +-----------------+-------------+----------+---------+ Mid upper arm        0.18        0.69   branching +-----------------+-------------+----------+---------+ Dist upper arm       0.22        0.22   Thrombus  +-----------------+-------------+----------+---------+ Antecubital fossa    0.21        0.26   Thrombus  +-----------------+-------------+----------+---------+ Prox forearm         0.15        0.39    branching +-----------------+-------------+----------+---------+ Mid forearm          0.09        0.17             +-----------------+-------------+----------+---------+ Dist forearm         0.10        0.40             +-----------------+-------------+----------+---------+ Wrist                0.07        0.27             +-----------------+-------------+----------+---------+ +-----------------+-------------+----------+--------------+ Right Basilic    Diameter (cm)Depth (cm)   Findings    +-----------------+-------------+----------+--------------+ Mid upper arm        0.37                              +-----------------+-------------+----------+--------------+ Dist upper arm       0.21                              +-----------------+-------------+----------+--------------+ Antecubital fossa    0.12                              +-----------------+-------------+----------+--------------+ Prox forearm                            not visualized +-----------------+-------------+----------+--------------+ Mid forearm                             not visualized +-----------------+-------------+----------+--------------+ Distal forearm                          not visualized +-----------------+-------------+----------+--------------+ Wrist  not visualized +-----------------+-------------+----------+--------------+ +-----------------+-------------+----------+-----------------+ Left Cephalic    Diameter (cm)Depth (cm)    Findings      +-----------------+-------------+----------+-----------------+ Shoulder             0.27        2.00                     +-----------------+-------------+----------+-----------------+ Prox upper arm       0.27        1.61                     +-----------------+-------------+----------+-----------------+ Mid upper arm        0.17        0.89       branching      +-----------------+-------------+----------+-----------------+ Dist upper arm       0.18        0.21                     +-----------------+-------------+----------+-----------------+ Antecubital fossa    0.26        0.22       Thrombus      +-----------------+-------------+----------+-----------------+ Prox forearm         0.29        0.31       Thrombus      +-----------------+-------------+----------+-----------------+ Mid forearm          0.28        0.33   Thrombus/catheter +-----------------+-------------+----------+-----------------+ Dist forearm         0.11        0.35                     +-----------------+-------------+----------+-----------------+ Wrist                0.17        0.21                     +-----------------+-------------+----------+-----------------+ +-----------------+-------------+----------+--------------+ Left Basilic     Diameter (cm)Depth (cm)   Findings    +-----------------+-------------+----------+--------------+ Dist upper arm       0.16                              +-----------------+-------------+----------+--------------+ Antecubital fossa                       not visualized +-----------------+-------------+----------+--------------+ Prox forearm         0.11                              +-----------------+-------------+----------+--------------+ Mid forearm                             not visualized +-----------------+-------------+----------+--------------+ Distal forearm       0.07                              +-----------------+-------------+----------+--------------+ Wrist                0.09                              +-----------------+-------------+----------+--------------+ Limited study due to edema, patient's body habitus and inability to properly  position. *See table(s) above for measurements and observations.  Diagnosing physician: Gerarda Fraction Electronically signed by Gerarda Fraction on  04/19/2023 at 4:21:55 PM.    Final      LOS: 5 days   Total time spent in review of labs and imaging, patient evaluation, formulation of plan, documentation and communication with family: 50 minutes  Lanae Boast, MD [facility]  04/20/2023, 1:30 PM

## 2023-04-21 DIAGNOSIS — R0789 Other chest pain: Secondary | ICD-10-CM | POA: Diagnosis not present

## 2023-04-21 LAB — CBC
HCT: 25.3 % — ABNORMAL LOW (ref 36.0–46.0)
Hemoglobin: 9 g/dL — ABNORMAL LOW (ref 12.0–15.0)
MCH: 27.4 pg (ref 26.0–34.0)
MCHC: 35.6 g/dL (ref 30.0–36.0)
MCV: 76.9 fL — ABNORMAL LOW (ref 80.0–100.0)
Platelets: 313 10*3/uL (ref 150–400)
RBC: 3.29 MIL/uL — ABNORMAL LOW (ref 3.87–5.11)
RDW: 16.1 % — ABNORMAL HIGH (ref 11.5–15.5)
WBC: 10.6 10*3/uL — ABNORMAL HIGH (ref 4.0–10.5)
nRBC: 0.8 % — ABNORMAL HIGH (ref 0.0–0.2)

## 2023-04-21 LAB — CULTURE, BLOOD (ROUTINE X 2)
Culture: NO GROWTH
Culture: NO GROWTH
Special Requests: ADEQUATE

## 2023-04-21 LAB — GLUCOSE, CAPILLARY
Glucose-Capillary: 131 mg/dL — ABNORMAL HIGH (ref 70–99)
Glucose-Capillary: 155 mg/dL — ABNORMAL HIGH (ref 70–99)
Glucose-Capillary: 158 mg/dL — ABNORMAL HIGH (ref 70–99)
Glucose-Capillary: 175 mg/dL — ABNORMAL HIGH (ref 70–99)
Glucose-Capillary: 78 mg/dL (ref 70–99)

## 2023-04-21 LAB — RENAL FUNCTION PANEL
Albumin: 2.1 g/dL — ABNORMAL LOW (ref 3.5–5.0)
Anion gap: 13 (ref 5–15)
BUN: 69 mg/dL — ABNORMAL HIGH (ref 6–20)
CO2: 18 mmol/L — ABNORMAL LOW (ref 22–32)
Calcium: 7.6 mg/dL — ABNORMAL LOW (ref 8.9–10.3)
Chloride: 104 mmol/L (ref 98–111)
Creatinine, Ser: 7.37 mg/dL — ABNORMAL HIGH (ref 0.44–1.00)
GFR, Estimated: 6 mL/min — ABNORMAL LOW (ref >60)
Glucose, Bld: 127 mg/dL — ABNORMAL HIGH (ref 70–99)
Phosphorus: 6.2 mg/dL — ABNORMAL HIGH (ref 2.5–4.6)
Potassium: 4.1 mmol/L (ref 3.5–5.1)
Sodium: 135 mmol/L (ref 135–145)

## 2023-04-21 LAB — CK: Total CK: 611 U/L — ABNORMAL HIGH (ref 38–234)

## 2023-04-21 LAB — HEPARIN LEVEL (UNFRACTIONATED)
Heparin Unfractionated: 0.28 [IU]/mL — ABNORMAL LOW (ref 0.30–0.70)
Heparin Unfractionated: 0.32 [IU]/mL (ref 0.30–0.70)

## 2023-04-21 MED ORDER — CHLORHEXIDINE GLUCONATE CLOTH 2 % EX PADS
6.0000 | MEDICATED_PAD | Freq: Every day | CUTANEOUS | Status: DC
  Administered 2023-04-22 – 2023-04-25 (×4): 6 via TOPICAL

## 2023-04-21 MED ORDER — ASPIRIN 81 MG PO TBEC
81.0000 mg | DELAYED_RELEASE_TABLET | Freq: Every day | ORAL | Status: DC
  Administered 2023-04-21 – 2023-04-26 (×6): 81 mg via ORAL
  Filled 2023-04-21 (×6): qty 1

## 2023-04-21 MED ORDER — ISOSORBIDE MONONITRATE ER 60 MG PO TB24
90.0000 mg | ORAL_TABLET | Freq: Every day | ORAL | Status: DC
  Administered 2023-04-21 – 2023-04-26 (×6): 90 mg via ORAL
  Filled 2023-04-21 (×7): qty 1

## 2023-04-21 MED ORDER — SODIUM CHLORIDE 0.9 % IV SOLN
200.0000 mg | INTRAVENOUS | Status: AC
  Administered 2023-04-22 – 2023-04-23 (×2): 200 mg via INTRAVENOUS
  Filled 2023-04-21 (×2): qty 10

## 2023-04-21 MED ORDER — HEPARIN SODIUM (PORCINE) 1000 UNIT/ML IJ SOLN
INTRAMUSCULAR | Status: AC
  Administered 2023-04-21: 3800 [IU]
  Filled 2023-04-21: qty 4

## 2023-04-21 MED ORDER — ATORVASTATIN CALCIUM 40 MG PO TABS
40.0000 mg | ORAL_TABLET | Freq: Every day | ORAL | Status: DC
  Administered 2023-04-21 – 2023-04-26 (×6): 40 mg via ORAL
  Filled 2023-04-21 (×6): qty 1

## 2023-04-21 MED ORDER — HYDROMORPHONE HCL 1 MG/ML IJ SOLN
0.5000 mg | INTRAMUSCULAR | Status: DC | PRN
  Administered 2023-04-22 – 2023-04-26 (×9): 0.5 mg via INTRAVENOUS
  Filled 2023-04-21 (×10): qty 0.5

## 2023-04-21 NOTE — Progress Notes (Signed)
PROGRESS NOTE Shirley [name]  ZOX:[id number] DOB: April 15, 1976 DOA: 04/15/2023 PCP: Bettey Costa, NP  Brief Narrative/Hospital Course: Shirley [name] is a 47 y.o. female with a history of CKD stage IV, hypertension, HFmrEF, CAD s/p PCI, stroke, OSA, recurrent VTE.  Patient presented secondary to flu-like symptoms, pleuritic chest pain and chest heaviness found to have influenza A infection. Patient started on Tamiflu for treatment. Hospitalization complicated by acute metabolic encephalopathy in addition to hypotension, requiring a one day admission to ICU. Although encephalopathy and hypotension improved, patient developed worsening AKI on underlying renal disease in addition to anuria/oliguria. 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   Subjective: Patient seen and examined this morning in dialysis Alert awake oriented to self current month and year Appears Frail weak and deconditioned Overnight patient has been afebrile BP stable  This morning undergoing dialysis  Labs shows metabolic acidosis bicarb 18 improving BUN 69/creatinine 7.3 with microcytic anemia stable at 9 g.     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 S/P TDC by IR 12/26 HD day #1 12/26, day #2 12/28.  Continue as per nephrology Monitor electrolytes volume status  closely Avoid nephrotoxic medications including NSAIDs and iodinated intravenous contrast exposure unless the latter is absolutely indicated.Preferred narcotic agents for pain control WJX:[id number], fentanyl, and methadone and avoid morphine.Avoid Baclofen and avoid oral sodium phosphate and magnesium citrate based laxatives / bowel preps. Continue strict Input and Output monitoring and serial renal functions. Recent Labs    08/28/22 1418 09/15/22 1405 04/15/23 2122 04/16/23 0800 04/16/23 1603 04/17/23 0804 04/18/23 1050 04/19/23 0655 04/20/23 0729 04/21/23 0541  BUN 41* 48* 42* 44* 46* 58* 75* 85* 67* 69*  CREATININE 2.69* 2.80* 4.51* 4.73* 5.26* 6.51* 7.79* 8.33* 6.60* 7.37*  CO2 19* 22 17* 17* 14* 13* 16* 13* 17* 18*  K 4.1 4.2 4.5 4.6 4.2 4.6 4.5 4.6 4.1 4.1    Metabolic acidosis: Due to ESRD-continue HD, off oral bicarb since being on HD  CKD-MBD: continue renal diet monitor PTH  Anemia of CKD Microcytic anemia: Monitor and transfuse for less than 7 g, follow-up iron panel-below serge iron at 35 ferritin elevated Recent Labs    08/12/22 0132 08/13/22 0617 04/16/23 0800 04/17/23 0804 04/18/23 1050 04/19/23 0655 04/20/23 0828 04/21/23 0541  HGB 8.3*   < > 10.5* 8.6*  --  9.2* 9.0* 9.0*  MCV 74.5*   < > 80.1 79.4*  --  77.5* 76.3* 76.9*  FERRITIN 28  --   --   --  374*  --   --   --   TIBC 252  --   --   --  251  --   --   --   IRON 22*  --   --   --  35  --   --   --    < > =  values in this interval not displayed.    Severe sepsis due to influenza Influenza A Infection: Afebrile vitals stable, completed Tramiflu 12/26.Blood culture no growth so far  Acute metabolic encephalopathy Unclear etiology but presumed secondary to sepsis and complicated by underlying renal disease.  Mentation overall stable and improved but he still appears weak and frail,continue supportive care. Cont HD   Hypotension Concerned this was secondary to volume depletion and was give  IV fluids while in ICU.  Bp stable now, toleratign HD  Primary hypertension- PTA amlodipine 10 and Coreg 25mg  CAD Angina Stable no chest pain. PTA on Imdur, Lipitor and aspiri and held -resume now Has been having chest pain, underwent  cxr-clear. D-dimer elevated at 1.95. V/Q scan obtained and was negative for acute PE.Continue home amlodipine, Coreg.  And hydralazine 3 times daily AND RESUME ASA.   HFrEF LVEF of 45-50% from 02/2022.  With evidence of lower leg edema but in the setting of fluid overload from worsening renal function.  Continue HD and address fluid status    Chronic DVT Lower extremity venous duplex on 12/24 revealed a right popliteal vein chronic DVT.  On heparin hopefully transition to Eliquis once no more procedures planned   Diabetes mellitus type 2 Well-controlled A1c, continue SSI and monitor CBG as below Recent Labs  Lab 04/16/23 0545 04/16/23 0805 04/20/23 1142 04/20/23 1527 04/20/23 2124 04/21/23 0029 04/21/23 0529  GLUCAP  --    < > 134* 183* 146* 175* 131*  HGBA1C 6.5*  --   --   --   --   --   --    < > = values in this interval not displayed.    Gastroparesis Noted history.   History of CVA Noted. Patient is on aspirin and Lipitor which are held on admit.    Obesity w/ OSA: Patient's Body mass index is 36.91 kg/m. : Will benefit with PCP follow-up, weight loss  healthy lifestyle and outpatient sleep evaluation.start  Cpap at beditime.  DVT prophylaxis:  Code Status:   Code Status: Full Code Family Communication: plan of care discussed with patient at bedside. Patient status is: Remains hospitalized because of severity of illness Level of care: Med-Surg   Dispo: The patient is from: home with boyfriend            Anticipated disposition: TBD Objective: Vitals last 24 hrs: Vitals:   04/21/23 0727 04/21/23 0743 04/21/23 0800 04/21/23 0830  BP: 136/72 128/70 128/68 (!) 148/89  Pulse: 74 74 71 77  Resp:  13 16 11   Temp: (!) 97.4 F (36.3  C)     TempSrc:      SpO2: 94% 94% 95% 95%  Weight: 106.9 kg     Height:       Weight change: -0.9 kg  Physical Examination: General exam: alert awake, apepars ill HEENT:Oral mucosa moist, Ear/Nose WNL grossly Respiratory system: Bilaterally clear BS,no use of accessory muscle Cardiovascular system: S1 & S2 +, No JVD. Gastrointestinal system: Abdomen soft,NT,ND, BS+ Nervous System: Alert, awake, moving all extremities,and following commands. Extremities: LE edema neg,distal peripheral pulses palpable and warm.  Skin: No rashes,no icterus. MSK: Normal muscle bulk,tone, power   TDC+  Medications reviewed: Scheduled Meds:  amLODipine  10 mg Oral Daily   calcium acetate  667 mg Oral TID WC   carvedilol  25 mg Oral BID WC   Chlorhexidine Gluconate Cloth  6 each Topical Daily   Chlorhexidine Gluconate Cloth  6 each Topical Q0600  hydrALAZINE  100 mg Oral Q8H   insulin aspart  0-6 Units Subcutaneous Q4H   pantoprazole (PROTONIX) IV  40 mg Intravenous Q24H   sodium chloride flush  3-10 mL Intravenous Q12H  Continuous Infusions:  heparin 800 Units/hr (04/21/23 0720)   iron sucrose Stopped (04/20/23 1726)    Diet Order             Diet renal with fluid restriction Fluid restriction: 1200 mL Fluid; Room service appropriate? Yes; Fluid consistency: Thin  Diet effective now                  Intake/Output Summary (Last 24 hours) at 04/21/2023 0856 Last data filed at 04/21/2023 0720 Gross per 24 hour  Intake 238.48 ml  Output --  Net 238.48 ml   Net IO Since Admission: 2,644.97 mL [04/21/23 0856]  Wt Readings from Last 3 Encounters:  04/21/23 106.9 kg  12/28/22 90.7 kg  09/28/22 90.7 kg     Unresulted Labs (From admission, onward)     Start     Ordered   04/22/23 0500  Heparin level (unfractionated)  Daily,   R     Question:  Specimen collection method  Answer:  Lab=Lab collect   04/20/23 1913   04/20/23 0500  CBC  Daily,   R     Question:  Specimen collection  method  Answer:  Lab=Lab collect   04/18/23 2253   04/19/23 0640  Renal function panel  Daily,   R     Question:  Specimen collection method  Answer:  Lab=Lab collect   04/19/23 0639   04/18/23 1017  CK  Daily,   R     Question:  Specimen collection method  Answer:  Lab=Lab collect   04/18/23 1016   04/16/23 1703  Rapid urine drug screen (hospital performed)  Add-on,   AD        04/16/23 1702   Unscheduled  Occult blood card to lab, stool  As needed,   R      04/18/23 1740          Data Reviewed: I have personally reviewed following labs and imaging studies CBC: Recent Labs  Lab 04/15/23 2122 04/16/23 0800 04/17/23 0804 04/19/23 0655 04/20/23 0828 04/21/23 0541  WBC 17.5* 15.7* 11.1* 13.9* 11.9* 10.6*  NEUTROABS 14.5* 14.1*  --   --   --   --   HGB 9.4* 10.5* 8.6* 9.2* 9.0* 9.0*  HCT 28.1* 31.0* 25.5* 26.8* 25.4* 25.3*  MCV 79.8* 80.1 79.4* 77.5* 76.3* 76.9*  PLT 390 411* 335 364 317 313   Basic Metabolic Panel:  Recent Labs  Lab 04/15/23 2327 04/16/23 0800 04/16/23 1603 04/17/23 0804 04/18/23 1050 04/19/23 0655 04/20/23 0729 04/21/23 0541  NA  --  138   < > 140 137 138 134* 135  K  --  4.6   < > 4.6 4.5 4.6 4.1 4.1  CL  --  111   < > 113* 112* 112* 104 104  CO2  --  17*   < > 13* 16* 13* 17* 18*  GLUCOSE  --  155*   < > 142* 118* 101* 145* 127*  BUN  --  44*   < > 58* 75* 85* 67* 69*  CREATININE  --  4.73*   < > 6.51* 7.79* 8.33* 6.60* 7.37*  CALCIUM  --  8.6*   < > 8.5* 8.2* 8.2* 7.5* 7.6*  MG 1.9 1.9  --   --   --   --   --   --  PHOS  --  4.8*  --   --  7.5* 7.9* 6.7* 6.2*   < > = values in this interval not displayed.   GFR: Estimated Creatinine Clearance: 11.6 mL/min (A) (by C-G formula based on SCr of 7.37 mg/dL (H)). Liver Function Tests:  Recent Labs  Lab 04/15/23 2122 04/16/23 0800 04/16/23 1603 04/18/23 1050 04/19/23 0655 04/20/23 0729 04/21/23 0541  AST 38 35 26  --   --   --   --   ALT 14 15 11   --   --   --   --   ALKPHOS 64 66 53   --   --   --   --   BILITOT 1.2* 0.5 0.6  --   --   --   --   PROT 7.0 7.5 6.0*  --   --   --   --   ALBUMIN 2.5* 2.6* 2.1* 2.3* 2.3* 2.0* 2.1*   Recent Labs  Lab 04/16/23 0800  LIPASE 24  No results for input(s): "AMMONIA" in the last 168 hours. Recent Labs  Lab 04/16/23 0216 04/16/23 0417 04/16/23 0551 04/16/23 1603 04/16/23 1902  PROCALCITON 0.12  --   --   --  0.33  LATICACIDVEN  --  0.6 0.5 1.3 1.2   Recent Results (from the past 240 hours)  Resp panel by RT-PCR (RSV, Flu A&B, Covid) Anterior Nasal Swab     Status: Abnormal   Collection Time: 04/15/23  9:23 PM   Specimen: Anterior Nasal Swab  Result Value Ref Range Status   SARS Coronavirus 2 by RT PCR NEGATIVE NEGATIVE Final   Influenza A by PCR POSITIVE (A) NEGATIVE Final   Influenza B by PCR NEGATIVE NEGATIVE Final    Comment: (NOTE) The Xpert Xpress SARS-CoV-2/FLU/RSV plus assay is intended as an aid in the diagnosis of influenza from Nasopharyngeal swab specimens and should not be used as a sole basis for treatment. Nasal washings and aspirates are unacceptable for Xpert Xpress SARS-CoV-2/FLU/RSV testing.  Fact Sheet for Patients: BloggerCourse.com  Fact Sheet for Healthcare Providers: SeriousBroker.it  This test is not yet approved or cleared by the [place] FDA and has been authorized for detection and/or diagnosis of SARS-CoV-2 by FDA under an Emergency Use Authorization (EUA). This EUA will remain in effect (meaning this test can be used) for the duration of the COVID-19 declaration under Section 564(b)(1) of the Act, 21 U.S.C. section 360bbb-3(b)(1), unless the authorization is terminated or revoked.     Resp Syncytial Virus by PCR NEGATIVE NEGATIVE Final    Comment: (NOTE) Fact Sheet for Patients: BloggerCourse.com  Fact Sheet for Healthcare Providers: SeriousBroker.it  This test is not yet  approved or cleared by the [place] FDA and has been authorized for detection and/or diagnosis of SARS-CoV-2 by FDA under an Emergency Use Authorization (EUA). This EUA will remain in effect (meaning this test can be used) for the duration of the COVID-19 declaration under Section 564(b)(1) of the Act, 21 U.S.C. section 360bbb-3(b)(1), unless the authorization is terminated or revoked.  Performed at [facility], [address], [place], [place] [address]   Culture, blood (Routine X 2) w Reflex to ID Panel     Status: None   Collection Time: 04/16/23  2:16 AM   Specimen: BLOOD  Result Value Ref Range Status   Specimen Description BLOOD SITE NOT SPECIFIED  Final   Special Requests   Final    BOTTLES DRAWN AEROBIC AND ANAEROBIC Blood Culture  adequate volume   Culture   Final    NO GROWTH 5 DAYS Performed at [facility], [address], [place], [place] [address]    Report Status 04/21/2023 FINAL  Final  Culture, blood (Routine X 2) w Reflex to ID Panel     Status: None   Collection Time: 04/16/23  2:26 AM   Specimen: BLOOD  Result Value Ref Range Status   Specimen Description BLOOD SITE NOT SPECIFIED  Final   Special Requests   Final    BOTTLES DRAWN AEROBIC AND ANAEROBIC Blood Culture results may not be optimal due to an inadequate volume of blood received in culture bottles   Culture   Final    NO GROWTH 5 DAYS Performed at [facility], [address] [place], [place] [address]    Report Status 04/21/2023 FINAL  Final  MRSA Next Gen by PCR, Nasal     Status: None   Collection Time: 04/16/23  5:07 PM   Specimen: Nasal Mucosa; Nasal Swab  Result Value Ref Range Status   MRSA by PCR Next Gen NOT DETECTED NOT DETECTED Final    Comment: (NOTE) The GeneXpert MRSA Assay (FDA approved for NASAL specimens only), is one component of a comprehensive MRSA colonization surveillance program. It is not intended to diagnose MRSA infection nor to guide or monitor  treatment for MRSA infections. Test performance is not FDA approved in patients less than 76 years old. Performed at [facility], [address] [place], [place] [address]     Antimicrobials/Microbiology: Anti-infectives (From admission, onward)    Start     Dose/Rate Route Frequency Ordered Stop   04/19/23 1550  ceFAZolin (ANCEF) IVPB 2g/100 mL premix        over 30 Minutes Intravenous Continuous PRN 04/19/23 1550 04/19/23 1613   04/19/23 1115  ceFAZolin (ANCEF) IVPB 2g/100 mL premix       Note to Pharmacy: TO BE GIVEN IR   2 g 200 mL/hr over 30 Minutes Intravenous To Radiology 04/19/23 1022 04/19/23 1643   04/19/23 1000  oseltamivir (TAMIFLU) capsule 30 mg        30 mg Oral  Once 04/19/23 0641 04/19/23 0831   04/16/23 1300  oseltamivir (TAMIFLU) capsule 30 mg  Status:  Discontinued        30 mg Oral Daily 04/16/23 1252 04/18/23 1403         Component Value Date/Time   SDES BLOOD SITE NOT SPECIFIED 04/16/2023 0226   SPECREQUEST  04/16/2023 0226    BOTTLES DRAWN AEROBIC AND ANAEROBIC Blood Culture results may not be optimal due to an inadequate volume of blood received in culture bottles   CULT  04/16/2023 0226    NO GROWTH 5 DAYS Performed at [facility], [address], [place], [place] [address]    REPTSTATUS 04/21/2023 FINAL 04/16/2023 0226     Radiology Studies: IR Fluoro Guide CV Line Right Result Date: 04/20/2023 CLINICAL DATA:  Renal failure requiring dialysis and need for tunneled hemodialysis catheter. EXAM: TUNNELED CENTRAL VENOUS HEMODIALYSIS CATHETER PLACEMENT WITH ULTRASOUND AND FLUOROSCOPIC GUIDANCE ANESTHESIA/SEDATION: Moderate (conscious) sedation was employed during this procedure. A total of Versed 0.5 mg and Fentanyl 25 mcg was administered intravenously. Moderate Sedation Time: 20 minutes. [name] level of consciousness and vital signs were monitored continuously by radiology nursing throughout the procedure under my direct supervision.  MEDICATIONS: 2 g IV Ancef. FLUOROSCOPY: 24 seconds.  5.9 mGy. PROCEDURE: The procedure, risks,  benefits, and alternatives were explained to the patient. Questions regarding the procedure were encouraged and answered. The patient understands and consents to the procedure. A timeout was performed prior to initiating the procedure. The right neck and chest were prepped with chlorhexidine in a sterile fashion, and a sterile drape was applied covering the operative field. Maximum barrier sterile technique with sterile gowns and gloves were used for the procedure. Local anesthesia was provided with 1% lidocaine. Ultrasound was used to confirm patency of the right internal jugular vein. An ultrasound image was saved and recorded. After creating a small venotomy incision, a 21 gauge needle was advanced into the right internal jugular vein under direct, real-time ultrasound guidance. Ultrasound image documentation was performed. After securing guidewire access, an 8 Fr dilator was placed. A J-wire was kinked to measure appropriate catheter length. A palindrome tunneled hemodialysis catheter measuring 23 cm from tip to cuff was chosen for placement. This was tunneled in a retrograde fashion from the chest wall to the venotomy incision. At the venotomy, serial dilatation was performed and a 15 Fr peel-away sheath was placed over a guidewire. The catheter was then placed through the sheath and the sheath removed. Final catheter positioning was confirmed and documented with a fluoroscopic spot image. The catheter was aspirated, flushed with saline, and injected with appropriate volume heparin dwells. The venotomy incision was closed with subcutaneous and subcuticular 4-0 Vicryl. Dermabond was applied to the incision. The catheter exit site was secured with 0-Prolene retention sutures. COMPLICATIONS: None.  No pneumothorax. FINDINGS: After catheter placement, the tip lies in the right atrium. The catheter aspirates normally and  is ready for immediate use. IMPRESSION: Placement of tunneled hemodialysis catheter via the right internal jugular vein. The catheter tip lies in the right atrium. The catheter is ready for immediate use. Electronically Signed   By: Irish Lack M.D.   On: 04/20/2023 08:08   IR US Guide Vasc Access Right Result Date: 04/20/2023 CLINICAL DATA:  Renal failure requiring dialysis and need for tunneled hemodialysis catheter. EXAM: TUNNELED CENTRAL VENOUS HEMODIALYSIS CATHETER PLACEMENT WITH ULTRASOUND AND FLUOROSCOPIC GUIDANCE ANESTHESIA/SEDATION: Moderate (conscious) sedation was employed during this procedure. A total of Versed 0.5 mg and Fentanyl 25 mcg was administered intravenously. Moderate Sedation Time: 20 minutes. [name] level of consciousness and vital signs were monitored continuously by radiology nursing throughout the procedure under my direct supervision. MEDICATIONS: 2 g IV Ancef. FLUOROSCOPY: 24 seconds.  5.9 mGy. PROCEDURE: The procedure, risks, benefits, and alternatives were explained to the patient. Questions regarding the procedure were encouraged and answered. The patient understands and consents to the procedure. A timeout was performed prior to initiating the procedure. The right neck and chest were prepped with chlorhexidine in a sterile fashion, and a sterile drape was applied covering the operative field. Maximum barrier sterile technique with sterile gowns and gloves were used for the procedure. Local anesthesia was provided with 1% lidocaine. Ultrasound was used to confirm patency of the right internal jugular vein. An ultrasound image was saved and recorded. After creating a small venotomy incision, a 21 gauge needle was advanced into the right internal jugular vein under direct, real-time ultrasound guidance. Ultrasound image documentation was performed. After securing guidewire access, an 8 Fr dilator was placed. A J-wire was kinked to measure appropriate catheter length. A  palindrome tunneled hemodialysis catheter measuring 23 cm from tip to cuff was chosen for placement. This was tunneled in a retrograde fashion from the chest wall to the venotomy incision. At  the venotomy, serial dilatation was performed and a 15 Fr peel-away sheath was placed over a guidewire. The catheter was then placed through the sheath and the sheath removed. Final catheter positioning was confirmed and documented with a fluoroscopic spot image. The catheter was aspirated, flushed with saline, and injected with appropriate volume heparin dwells. The venotomy incision was closed with subcutaneous and subcuticular 4-0 Vicryl. Dermabond was applied to the incision. The catheter exit site was secured with 0-Prolene retention sutures. COMPLICATIONS: None.  No pneumothorax. FINDINGS: After catheter placement, the tip lies in the right atrium. The catheter aspirates normally and is ready for immediate use. IMPRESSION: Placement of tunneled hemodialysis catheter via the right internal jugular vein. The catheter tip lies in the right atrium. The catheter is ready for immediate use. Electronically Signed   By: Irish Lack M.D.   On: 04/20/2023 08:08   VAS [place] UPPER EXT VEIN MAPPING (PRE-OP AVF) Result Date: 04/19/2023 UPPER EXTREMITY VEIN MAPPING Patient Name:  Shirley RIESGO Spoonemore  Date of Exam:   04/19/2023 Medical Rec #: [id number]         Accession #:    [id number] Date of Birth: [birth year]         Patient Gender: F Patient Age:   51 years Exam Location:  [facility] Procedure:      VAS [place] UPPER EXT VEIN MAPPING (PRE-OP AVF) Referring Phys: Vallery Sa --------------------------------------------------------------------------------  Indications: Pre-access. Performing Technologist: Fernande Bras  Examination Guidelines: A complete evaluation includes B-mode imaging, spectral Doppler, color Doppler, and power Doppler as needed of all accessible portions of each vessel. Bilateral testing is considered  an integral part of a complete examination. Limited examinations for reoccurring indications may be performed as noted. +-----------------+-------------+----------+---------+ Right Cephalic   Diameter (cm)Depth (cm)Findings  +-----------------+-------------+----------+---------+ Shoulder             0.24        1.90             +-----------------+-------------+----------+---------+ Prox upper arm       0.24        1.43             +-----------------+-------------+----------+---------+ Mid upper arm        0.18        0.69   branching +-----------------+-------------+----------+---------+ Dist upper arm       0.22        0.22   Thrombus  +-----------------+-------------+----------+---------+ Antecubital fossa    0.21        0.26   Thrombus  +-----------------+-------------+----------+---------+ Prox forearm         0.15        0.39   branching +-----------------+-------------+----------+---------+ Mid forearm          0.09        0.17             +-----------------+-------------+----------+---------+ Dist forearm         0.10        0.40             +-----------------+-------------+----------+---------+ Wrist                0.07        0.27             +-----------------+-------------+----------+---------+ +-----------------+-------------+----------+--------------+ Right Basilic    Diameter (cm)Depth (cm)   Findings    +-----------------+-------------+----------+--------------+ Mid upper arm        0.37                              +-----------------+-------------+----------+--------------+  Dist upper arm       0.21                              +-----------------+-------------+----------+--------------+ Antecubital fossa    0.12                              +-----------------+-------------+----------+--------------+ Prox forearm                            not visualized +-----------------+-------------+----------+--------------+ Mid forearm                              not visualized +-----------------+-------------+----------+--------------+ Distal forearm                          not visualized +-----------------+-------------+----------+--------------+ Wrist                                   not visualized +-----------------+-------------+----------+--------------+ +-----------------+-------------+----------+-----------------+ Left Cephalic    Diameter (cm)Depth (cm)    Findings      +-----------------+-------------+----------+-----------------+ Shoulder             0.27        2.00                     +-----------------+-------------+----------+-----------------+ Prox upper arm       0.27        1.61                     +-----------------+-------------+----------+-----------------+ Mid upper arm        0.17        0.89       branching     +-----------------+-------------+----------+-----------------+ Dist upper arm       0.18        0.21                     +-----------------+-------------+----------+-----------------+ Antecubital fossa    0.26        0.22       Thrombus      +-----------------+-------------+----------+-----------------+ Prox forearm         0.29        0.31       Thrombus      +-----------------+-------------+----------+-----------------+ Mid forearm          0.28        0.33   Thrombus/catheter +-----------------+-------------+----------+-----------------+ Dist forearm         0.11        0.35                     +-----------------+-------------+----------+-----------------+ Wrist                0.17        0.21                     +-----------------+-------------+----------+-----------------+ +-----------------+-------------+----------+--------------+ Left Basilic     Diameter (cm)Depth (cm)   Findings    +-----------------+-------------+----------+--------------+ Dist upper arm       0.16                               +-----------------+-------------+----------+--------------+  Antecubital fossa                       not visualized +-----------------+-------------+----------+--------------+ Prox forearm         0.11                              +-----------------+-------------+----------+--------------+ Mid forearm                             not visualized +-----------------+-------------+----------+--------------+ Distal forearm       0.07                              +-----------------+-------------+----------+--------------+ Wrist                0.09                              +-----------------+-------------+----------+--------------+ Limited study due to edema, patient's body habitus and inability to properly position. *See table(s) above for measurements and observations.  Diagnosing physician: Gerarda Fraction Electronically signed by Gerarda Fraction on 04/19/2023 at 4:21:55 PM.    Final      LOS: 6 days   Total time spent in review of labs and imaging, patient evaluation, formulation of plan, documentation and communication with family: 50 minutes  Lanae Boast, MD [facility]  04/21/2023, 8:56 AM

## 2023-04-21 NOTE — Progress Notes (Signed)
PHARMACY - ANTICOAGULATION CONSULT NOTE  Pharmacy Consult for heparin Indication: chronic DVT R politeal vein  Allergies  Allergen Reactions   Morphine And Codeine Itching and Swelling   Zestril [Lisinopril] Nausea Only and Swelling    Patient Measurements: Height: 5\' 7"  (170.2 cm) Weight: 105.9 kg (233 lb 7.5 oz) IBW/kg (Calculated) : 61.6 Heparin Dosing Weight: 80.7kg  Vital Signs: Temp: 97.9 F (36.6 C) (12/28 1141) Temp Source: Oral (12/28 1141) BP: 130/73 (12/28 1710) Pulse Rate: 72 (12/28 1710)  Labs: Recent Labs    04/19/23 0655 04/20/23 0729 04/20/23 0828 04/20/23 1819 04/21/23 0541 04/21/23 1757  HGB 9.2*  --  9.0*  --  9.0*  --   HCT 26.8*  --  25.4*  --  25.3*  --   PLT 364  --  317  --  313  --   HEPARINUNFRC 0.98*  --  >1.10* 0.21* 0.28* 0.32  CREATININE 8.33* 6.60*  --   --  7.37*  --   CKTOTAL 1,102* 549*  --   --  611*  --     Estimated Creatinine Clearance: 11.6 mL/min (A) (by C-G formula based on SCr of 7.37 mg/dL (H)).  Assessment: 47 yoF presented to ED with chest pain and shortness of breath. PMH includes: HFrEF (45-50%), CKD Stage 4, CAD s/p PCI, stroke, T2DM, h/o DVT/PE (h/o eliquis >> patient states no longer taking). Pharmacy consulted to dose heparin for history of DVT/PE. Heparin was then changed to SQ heparin 12/24 after VQ scan negative for PE. 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 Pharmacy consulted to restart IV heparin.    Heparin level 0.32 is at low end of therapeutic. Heparin was charted as turned off at 15:30 but per RN, just had some issues with pump beeping and it has been running otherwise.    Goal of Therapy:  Heparin level 0.3-0.7 units/ml Monitor platelets by anticoagulation protocol: Yes   Plan:  Increase heparin infusion to 900 units/hour  Daily heparin level and CBC Monitor for s/sx of bleeding F/U transition to oral [facility]   Thank you for involving pharmacy in this patient's care.  Alphia Moh, PharmD, BCPS, BCCP Clinical Pharmacist  Please check AMION for all [facility] Pharmacy phone numbers After 10:00 PM, call Main Pharmacy [phone]

## 2023-04-21 NOTE — Plan of Care (Signed)
  Problem: Education: Goal: Ability to describe self-care measures that may prevent or decrease complications (Diabetes Survival Skills Education) will improve Outcome: Progressing Goal: Individualized Educational Video(s) Outcome: Progressing   Problem: Coping: Goal: Ability to adjust to condition or change in health will improve Outcome: Progressing   

## 2023-04-21 NOTE — Procedures (Signed)
HD Note:  Some information was entered later than the data was gathered due to patient care needs. The stated time with the data is accurate.  Received patient in bed to unit.   Alert and oriented.  Patient complaining of chest pain 8/10 and that she is scared.  Nurse notified and stated that this has been ongoing.  Attending and Nephrology doctors notified. Patient heart rhythm NSR  Informed consent signed and in chart.   Access used: Right upper chest HD catheter Access issues: None  Patient closed eyes and rested quietly after the start of treatment. No complaints during treatment.  Patient stated she was in pain during post treatment care, Med given.  See MAR  Patient tolerated treatment well.   TX duration: 2.5 hours  Alert, without acute distress.  Total UF removed: 1000 ml  Hand-off given to patient's nurse.   Transported back to the room   Ameer Sanden L. Dareen Piano, RN [facility].

## 2023-04-21 NOTE — Progress Notes (Signed)
PHARMACY - ANTICOAGULATION CONSULT NOTE  Pharmacy Consult for heparin Indication: chronic DVT R politeal vein  Allergies  Allergen Reactions   Morphine And Codeine Itching and Swelling   Zestril [Lisinopril] Nausea Only and Swelling    Patient Measurements: Height: 5\' 7"  (170.2 cm) Weight: 106.9 kg (235 lb 10.8 oz) IBW/kg (Calculated) : 61.6 Heparin Dosing Weight: 80.7kg  Vital Signs: Temp: 97.4 F (36.3 C) (12/28 0727) Temp Source: Oral (12/28 0500) BP: 148/89 (12/28 0830) Pulse Rate: 77 (12/28 0830)  Labs: Recent Labs    04/19/23 0655 04/20/23 0729 04/20/23 0828 04/20/23 1819 04/21/23 0541  HGB 9.2*  --  9.0*  --  9.0*  HCT 26.8*  --  25.4*  --  25.3*  PLT 364  --  317  --  313  HEPARINUNFRC 0.98*  --  >1.10* 0.21* 0.28*  CREATININE 8.33* 6.60*  --   --  7.37*  CKTOTAL 1,102* 549*  --   --  611*    Estimated Creatinine Clearance: 11.6 mL/min (A) (by C-G formula based on SCr of 7.37 mg/dL (H)).  Assessment: 39 yoF presented to ED with chest pain and shortness of breath. PMH includes: HFrEF (45-50%), CKD Stage 4, CAD s/p PCI, stroke, T2DM, h/o DVT/PE (h/o eliquis >> patient states no longer taking). Pharmacy consulted to dose heparin for history of DVT/PE. Heparin was then changed to SQ heparin 12/24 after VQ scan negative for PE. 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 Pharmacy consulted to restart IV heparin. Last SQ heparin 5000 units given 1330. Hgb down to 8.6, ptl wnl. Noted that pt was subtherapeutic (heparin level 0.19) on 12/22 on heparin 1300 units/hr with SCr 4.7, now SCr up to 7.79.  12/28 AM: Heparin level 0.28, subtherapeutic on heparin 800 units/hr. No issues with infusion running or signs of bleeding per RN. Noted patient was previously supra-therapeutic on 950 units/hr. CBC stable (Hgb 9.0, PLT 313).   Goal of Therapy:  Heparin level 0.3-0.7 units/ml Monitor platelets by anticoagulation protocol: Yes   Plan:  Increase heparin  infusion to 850 units/hour  8h heparin level Daily heparin level and CBC Monitor for s/sx of bleeding F/U transition to oral [facility]   Thank you for involving pharmacy in this patient's care.  Enos Fling, PharmD PGY-1 Acute Care Pharmacy Resident 04/21/2023 8:53 AM

## 2023-04-21 NOTE — Procedures (Signed)
Patient was seen on dialysis and the procedure was supervised.  BFR 250  Via TDC BP is  148/89.   Patient appears to be tolerating treatment well  [name] 04/21/2023

## 2023-04-21 NOTE — Progress Notes (Addendum)
[facility] Progress Note    Assessment/ Plan:   CKD5, progressed to ESRD -Dr. Malen Gauze discussed with patient's daughter on 12/25 in regards to starting HD and they did consent - HD#1 12/27 early AM , s/p TDC with IR  - renal navigator involved  -tentatively planning on HD#2 today, then #3 on Monday -  will need [facility] consult early next week for AVF-  from what I see today could not do home dialysis -  is also hep B positive so will need isolation room   Severe sepsis Influenza A -per primary service  HTN -UF as tolerated with HD. BP acceptable norvasc 10, coreg 25 BID, hydral 100 tid and PRNs-  hopefully will be able to wean BP meds when volume status is better  Metabolic acidosis - improving  HFrEF -UF as tolerated with HD  DM2 -Per primary  Anemia of CKD -transfuse prn for hgb <7 - Fe panel low, treat with IV iron first-  hgb 9-10   Chronic DVT -on heparin gtt -per primary  CKD MBD -renal diet-  added phoslo for binding  -checking PTH -  is 273  Subjective:   Seen in HD-  was c/o chest pain while being hooked up but now resting comfortably-  now seems the plan is for SNF   Objective:   BP (!) 148/89   Pulse 77   Temp (!) 97.4 F (36.3 C) Comment: axillary  Resp 11   Ht 5\' 7"  (1.702 m)   Wt 106.9 kg   LMP 01/22/2021 Comment: neg preg test 04/17/21  SpO2 95%   BMI 36.91 kg/m   Intake/Output Summary (Last 24 hours) at 04/21/2023 0844 Last data filed at 04/21/2023 0720 Gross per 24 hour  Intake 238.48 ml  Output --  Net 238.48 ml   Weight change: -0.9 kg  Physical Exam: Gen: ill appearing, NAD, drowsy CVS: RRR Resp: decreased breath sounds bibasilar, normal wob Abd: soft Ext: trace to 1+ pitting edema b/l Les Neuro: drowsy, intermittently answering questions TDC in place   Imaging: IR Fluoro Guide CV Line Right Result Date: 04/20/2023 CLINICAL DATA:  Renal failure requiring dialysis and need for tunneled hemodialysis catheter.  EXAM: TUNNELED CENTRAL VENOUS HEMODIALYSIS CATHETER PLACEMENT WITH ULTRASOUND AND FLUOROSCOPIC GUIDANCE ANESTHESIA/SEDATION: Moderate (conscious) sedation was employed during this procedure. A total of Versed 0.5 mg and Fentanyl 25 mcg was administered intravenously. Moderate Sedation Time: 20 minutes. [name] level of consciousness and vital signs were monitored continuously by radiology nursing throughout the procedure under my direct supervision. MEDICATIONS: 2 g IV Ancef. FLUOROSCOPY: 24 seconds.  5.9 mGy. PROCEDURE: The procedure, risks, benefits, and alternatives were explained to the patient. Questions regarding the procedure were encouraged and answered. The patient understands and consents to the procedure. A timeout was performed prior to initiating the procedure. The right neck and chest were prepped with chlorhexidine in a sterile fashion, and a sterile drape was applied covering the operative field. Maximum barrier sterile technique with sterile gowns and gloves were used for the procedure. Local anesthesia was provided with 1% lidocaine. Ultrasound was used to confirm patency of the right internal jugular vein. An ultrasound image was saved and recorded. After creating a small venotomy incision, a 21 gauge needle was advanced into the right internal jugular vein under direct, real-time ultrasound guidance. Ultrasound image documentation was performed. After securing guidewire access, an 8 Fr dilator was placed. A J-wire was kinked to measure appropriate catheter length. A palindrome tunneled hemodialysis catheter  measuring 23 cm from tip to cuff was chosen for placement. This was tunneled in a retrograde fashion from the chest wall to the venotomy incision. At the venotomy, serial dilatation was performed and a 15 Fr peel-away sheath was placed over a guidewire. The catheter was then placed through the sheath and the sheath removed. Final catheter positioning was confirmed and documented with a  fluoroscopic spot image. The catheter was aspirated, flushed with saline, and injected with appropriate volume heparin dwells. The venotomy incision was closed with subcutaneous and subcuticular 4-0 Vicryl. Dermabond was applied to the incision. The catheter exit site was secured with 0-Prolene retention sutures. COMPLICATIONS: None.  No pneumothorax. FINDINGS: After catheter placement, the tip lies in the right atrium. The catheter aspirates normally and is ready for immediate use. IMPRESSION: Placement of tunneled hemodialysis catheter via the right internal jugular vein. The catheter tip lies in the right atrium. The catheter is ready for immediate use. Electronically Signed   By: Irish Lack M.D.   On: 04/20/2023 08:08   IR US Guide Vasc Access Right Result Date: 04/20/2023 CLINICAL DATA:  Renal failure requiring dialysis and need for tunneled hemodialysis catheter. EXAM: TUNNELED CENTRAL VENOUS HEMODIALYSIS CATHETER PLACEMENT WITH ULTRASOUND AND FLUOROSCOPIC GUIDANCE ANESTHESIA/SEDATION: Moderate (conscious) sedation was employed during this procedure. A total of Versed 0.5 mg and Fentanyl 25 mcg was administered intravenously. Moderate Sedation Time: 20 minutes. [name] level of consciousness and vital signs were monitored continuously by radiology nursing throughout the procedure under my direct supervision. MEDICATIONS: 2 g IV Ancef. FLUOROSCOPY: 24 seconds.  5.9 mGy. PROCEDURE: The procedure, risks, benefits, and alternatives were explained to the patient. Questions regarding the procedure were encouraged and answered. The patient understands and consents to the procedure. A timeout was performed prior to initiating the procedure. The right neck and chest were prepped with chlorhexidine in a sterile fashion, and a sterile drape was applied covering the operative field. Maximum barrier sterile technique with sterile gowns and gloves were used for the procedure. Local anesthesia was provided  with 1% lidocaine. Ultrasound was used to confirm patency of the right internal jugular vein. An ultrasound image was saved and recorded. After creating a small venotomy incision, a 21 gauge needle was advanced into the right internal jugular vein under direct, real-time ultrasound guidance. Ultrasound image documentation was performed. After securing guidewire access, an 8 Fr dilator was placed. A J-wire was kinked to measure appropriate catheter length. A palindrome tunneled hemodialysis catheter measuring 23 cm from tip to cuff was chosen for placement. This was tunneled in a retrograde fashion from the chest wall to the venotomy incision. At the venotomy, serial dilatation was performed and a 15 Fr peel-away sheath was placed over a guidewire. The catheter was then placed through the sheath and the sheath removed. Final catheter positioning was confirmed and documented with a fluoroscopic spot image. The catheter was aspirated, flushed with saline, and injected with appropriate volume heparin dwells. The venotomy incision was closed with subcutaneous and subcuticular 4-0 Vicryl. Dermabond was applied to the incision. The catheter exit site was secured with 0-Prolene retention sutures. COMPLICATIONS: None.  No pneumothorax. FINDINGS: After catheter placement, the tip lies in the right atrium. The catheter aspirates normally and is ready for immediate use. IMPRESSION: Placement of tunneled hemodialysis catheter via the right internal jugular vein. The catheter tip lies in the right atrium. The catheter is ready for immediate use. Electronically Signed   By: Irish Lack M.D.   On: 04/20/2023  08:08   VAS [place] UPPER EXT VEIN MAPPING (PRE-OP AVF) Result Date: 04/19/2023 UPPER EXTREMITY VEIN MAPPING Patient Name:  Shirley [name]  Date of Exam:   04/19/2023 Medical Rec #: [id number]         Accession #:    [id number] Date of Birth: 1976-04-21         Patient Gender: F Patient Age:   47 years Exam Location:  [facility] Procedure:      VAS [place] UPPER EXT VEIN MAPPING (PRE-OP AVF) Referring Phys: Vallery Sa --------------------------------------------------------------------------------  Indications: Pre-access. Performing Technologist: Fernande Bras  Examination Guidelines: A complete evaluation includes B-mode imaging, spectral Doppler, color Doppler, and power Doppler as needed of all accessible portions of each vessel. Bilateral testing is considered an integral part of a complete examination. Limited examinations for reoccurring indications may be performed as noted. +-----------------+-------------+----------+---------+ Right Cephalic   Diameter (cm)Depth (cm)Findings  +-----------------+-------------+----------+---------+ Shoulder             0.24        1.90             +-----------------+-------------+----------+---------+ Prox upper arm       0.24        1.43             +-----------------+-------------+----------+---------+ Mid upper arm        0.18        0.69   branching +-----------------+-------------+----------+---------+ Dist upper arm       0.22        0.22   Thrombus  +-----------------+-------------+----------+---------+ Antecubital fossa    0.21        0.26   Thrombus  +-----------------+-------------+----------+---------+ Prox forearm         0.15        0.39   branching +-----------------+-------------+----------+---------+ Mid forearm          0.09        0.17             +-----------------+-------------+----------+---------+ Dist forearm         0.10        0.40             +-----------------+-------------+----------+---------+ Wrist                0.07        0.27             +-----------------+-------------+----------+---------+ +-----------------+-------------+----------+--------------+ Right Basilic    Diameter (cm)Depth (cm)   Findings    +-----------------+-------------+----------+--------------+ Mid upper arm        0.37                               +-----------------+-------------+----------+--------------+ Dist upper arm       0.21                              +-----------------+-------------+----------+--------------+ Antecubital fossa    0.12                              +-----------------+-------------+----------+--------------+ Prox forearm                            not visualized +-----------------+-------------+----------+--------------+ Mid forearm  not visualized +-----------------+-------------+----------+--------------+ Distal forearm                          not visualized +-----------------+-------------+----------+--------------+ Wrist                                   not visualized +-----------------+-------------+----------+--------------+ +-----------------+-------------+----------+-----------------+ Left Cephalic    Diameter (cm)Depth (cm)    Findings      +-----------------+-------------+----------+-----------------+ Shoulder             0.27        2.00                     +-----------------+-------------+----------+-----------------+ Prox upper arm       0.27        1.61                     +-----------------+-------------+----------+-----------------+ Mid upper arm        0.17        0.89       branching     +-----------------+-------------+----------+-----------------+ Dist upper arm       0.18        0.21                     +-----------------+-------------+----------+-----------------+ Antecubital fossa    0.26        0.22       Thrombus      +-----------------+-------------+----------+-----------------+ Prox forearm         0.29        0.31       Thrombus      +-----------------+-------------+----------+-----------------+ Mid forearm          0.28        0.33   Thrombus/catheter +-----------------+-------------+----------+-----------------+ Dist forearm         0.11        0.35                      +-----------------+-------------+----------+-----------------+ Wrist                0.17        0.21                     +-----------------+-------------+----------+-----------------+ +-----------------+-------------+----------+--------------+ Left Basilic     Diameter (cm)Depth (cm)   Findings    +-----------------+-------------+----------+--------------+ Dist upper arm       0.16                              +-----------------+-------------+----------+--------------+ Antecubital fossa                       not visualized +-----------------+-------------+----------+--------------+ Prox forearm         0.11                              +-----------------+-------------+----------+--------------+ Mid forearm                             not visualized +-----------------+-------------+----------+--------------+ Distal forearm       0.07                              +-----------------+-------------+----------+--------------+  Wrist                0.09                              +-----------------+-------------+----------+--------------+ Limited study due to edema, patient's body habitus and inability to properly position. *See table(s) above for measurements and observations.  Diagnosing physician: Gerarda Fraction Electronically signed by Gerarda Fraction on 04/19/2023 at 4:21:55 PM.    Final     Labs: BMET Recent Labs  Lab 04/16/23 0800 04/16/23 1603 04/17/23 0804 04/18/23 1050 04/19/23 0655 04/20/23 0729 04/21/23 0541  NA 138 138 140 137 138 134* 135  K 4.6 4.2 4.6 4.5 4.6 4.1 4.1  CL 111 113* 113* 112* 112* 104 104  CO2 17* 14* 13* 16* 13* 17* 18*  GLUCOSE 155* 120* 142* 118* 101* 145* 127*  BUN 44* 46* 58* 75* 85* 67* 69*  CREATININE 4.73* 5.26* 6.51* 7.79* 8.33* 6.60* 7.37*  CALCIUM 8.6* 8.3* 8.5* 8.2* 8.2* 7.5* 7.6*  PHOS 4.8*  --   --  7.5* 7.9* 6.7* 6.2*   CBC Recent Labs  Lab 04/15/23 2122 04/16/23 0800 04/17/23 0804 04/19/23 0655 04/20/23 0828  04/21/23 0541  WBC 17.5* 15.7* 11.1* 13.9* 11.9* 10.6*  NEUTROABS 14.5* 14.1*  --   --   --   --   HGB 9.4* 10.5* 8.6* 9.2* 9.0* 9.0*  HCT 28.1* 31.0* 25.5* 26.8* 25.4* 25.3*  MCV 79.8* 80.1 79.4* 77.5* 76.3* 76.9*  PLT 390 411* 335 364 317 313    Medications:     amLODipine  10 mg Oral Daily   calcium acetate  667 mg Oral TID WC   carvedilol  25 mg Oral BID WC   Chlorhexidine Gluconate Cloth  6 each Topical Daily   Chlorhexidine Gluconate Cloth  6 each Topical Q0600   hydrALAZINE  100 mg Oral Q8H   insulin aspart  0-6 Units Subcutaneous Q4H   pantoprazole (PROTONIX) IV  40 mg Intravenous Q24H   sodium chloride flush  3-10 mL Intravenous Q12H      Shirley [name]  [employer] 04/21/2023, 8:44 AM

## 2023-04-21 NOTE — Progress Notes (Signed)
Physical Therapy Treatment Patient Details Name: Shirley [name] MRN: [id number] DOB: [birth year] Today's Date: 04/21/2023   History of Present Illness 47 y.o. female presented 12/22 secondary to flu-like symptoms, pleuritic chest pain and chest heaviness found to have influenza A infection. Hospitalization complicated by acute metabolic encephalopathy in addition to hypotension. 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 with a history of CKD stage IV, hypertension, HFmrEF, CAD s/p PCI, stroke, OSA, recurrent VTE.    PT Comments  Pt remains limited by fatigue but is able to tolerate multiple transfers this session. Pt reports generalized weakness, preventing ambulation and limiting the pt to step-pivot transfers. Pt benefits from verbal and tactile cues to improve safety during transfers as she demonstrates poor safety awareness at times during session. PT continues to recommend short term inpatient PT services, however pt states a goal of returning home. PT will follow up frequently in an effort to improve activity tolerance and reduce falls risk.   If plan is discharge home, recommend the following: A Lien help with walking and/or transfers;A lot of help with bathing/dressing/bathroom;Assistance with cooking/housework;Direct supervision/assist for medications management;Direct supervision/assist for financial management;Assist for transportation;Supervision due to cognitive status   Can travel by private vehicle     Yes  Equipment Recommendations  Rolling walker (2 wheels);BSC/3in1    Recommendations for Other Services       Precautions / Restrictions Precautions Precautions: Fall Restrictions Weight Bearing Restrictions Per Provider Order: No     Mobility  Bed Mobility Overal bed mobility: Needs Assistance Bed Mobility: Supine to Sit     Supine to sit: Supervision, HOB elevated          Transfers Overall transfer level: Needs  assistance Equipment used: Rolling walker (2 wheels) Transfers: Sit to/from Stand, Bed to chair/wheelchair/BSC Sit to Stand: Contact guard assist   Step pivot transfers: Min assist       General transfer comment: verbal cues to complete turn prior to initiating sit    Ambulation/Gait Ambulation/Gait assistance:  (deferred due to pt quickly fatiguing during standing)                 Stairs             Wheelchair Mobility     Tilt Bed    Modified Rankin (Stroke Patients Only)       Balance Overall balance assessment: Needs assistance Sitting-balance support: No upper extremity supported, Feet supported Sitting balance-Leahy Scale: Fair     Standing balance support: Bilateral upper extremity supported, Reliant on assistive device for balance Standing balance-Leahy Scale: Poor                              Cognition Arousal: Alert Behavior During Therapy: Flat affect Overall Cognitive Status: Impaired/Different from baseline Area of Impairment: Orientation, Memory, Problem solving                 Orientation Level: Disoriented to, Time   Memory: Decreased short-term memory       Problem Solving: Slow processing          Exercises      General Comments General comments (skin integrity, edema, etc.): VSS on RA      Pertinent Vitals/Pain Pain Assessment Pain Assessment: Faces Faces Pain Scale: Hurts [place] more Pain Location: generalized Pain Descriptors / Indicators: Moaning Pain Intervention(s): Monitored during session    Home Living  Prior Function            PT Goals (current goals can now be found in the care plan section) Acute Rehab PT Goals Patient Stated Goal: to go home Progress towards PT goals: Progressing toward goals    Frequency    Min 1X/week      PT Plan      Co-evaluation              AM-PAC PT "6 Clicks" Mobility   Outcome Measure  Help  needed turning from your back to your side while in a flat bed without using bedrails?: A Hence Help needed moving from lying on your back to sitting on the side of a flat bed without using bedrails?: A Luz Help needed moving to and from a bed to a chair (including a wheelchair)?: A Glatfelter Help needed standing up from a chair using your arms (e.g., wheelchair or bedside chair)?: A Weinreb Help needed to walk in hospital room?: Total Help needed climbing 3-5 steps with a railing? : Total 6 Click Score: 14    End of Session Equipment Utilized During Treatment: Gait belt Activity Tolerance: Patient limited by fatigue Patient left: in chair;with call bell/phone within reach;with chair alarm set Nurse Communication: Mobility status PT Visit Diagnosis: Unsteadiness on feet (R26.81);Muscle weakness (generalized) (M62.81);Difficulty in walking, not elsewhere classified (R26.2);Other symptoms and signs involving the nervous system (R29.898)     Time: 4098-1191 PT Time Calculation (min) (ACUTE ONLY): 28 min  Charges:    $Therapeutic Activity: 23-37 mins PT General Charges $$ ACUTE PT VISIT: 1 Visit                     Arlyss Gandy, PT, DPT Acute Rehabilitation Office [phone]    Arlyss Gandy 04/21/2023, 4:58 PM

## 2023-04-21 NOTE — Plan of Care (Signed)

## 2023-04-22 DIAGNOSIS — R0789 Other chest pain: Secondary | ICD-10-CM | POA: Diagnosis not present

## 2023-04-22 LAB — RENAL FUNCTION PANEL
Albumin: 2.1 g/dL — ABNORMAL LOW (ref 3.5–5.0)
Anion gap: 11 (ref 5–15)
BUN: 50 mg/dL — ABNORMAL HIGH (ref 6–20)
CO2: 22 mmol/L (ref 22–32)
Calcium: 7.8 mg/dL — ABNORMAL LOW (ref 8.9–10.3)
Chloride: 102 mmol/L (ref 98–111)
Creatinine, Ser: 5.82 mg/dL — ABNORMAL HIGH (ref 0.44–1.00)
GFR, Estimated: 8 mL/min — ABNORMAL LOW (ref >60)
Glucose, Bld: 135 mg/dL — ABNORMAL HIGH (ref 70–99)
Phosphorus: 4.5 mg/dL (ref 2.5–4.6)
Potassium: 3.9 mmol/L (ref 3.5–5.1)
Sodium: 135 mmol/L (ref 135–145)

## 2023-04-22 LAB — GLUCOSE, CAPILLARY
Glucose-Capillary: 115 mg/dL — ABNORMAL HIGH (ref 70–99)
Glucose-Capillary: 122 mg/dL — ABNORMAL HIGH (ref 70–99)
Glucose-Capillary: 131 mg/dL — ABNORMAL HIGH (ref 70–99)
Glucose-Capillary: 140 mg/dL — ABNORMAL HIGH (ref 70–99)
Glucose-Capillary: 142 mg/dL — ABNORMAL HIGH (ref 70–99)
Glucose-Capillary: 171 mg/dL — ABNORMAL HIGH (ref 70–99)

## 2023-04-22 LAB — CBC
HCT: 24.6 % — ABNORMAL LOW (ref 36.0–46.0)
Hemoglobin: 8.6 g/dL — ABNORMAL LOW (ref 12.0–15.0)
MCH: 26.7 pg (ref 26.0–34.0)
MCHC: 35 g/dL (ref 30.0–36.0)
MCV: 76.4 fL — ABNORMAL LOW (ref 80.0–100.0)
Platelets: 290 10*3/uL (ref 150–400)
RBC: 3.22 MIL/uL — ABNORMAL LOW (ref 3.87–5.11)
RDW: 16 % — ABNORMAL HIGH (ref 11.5–15.5)
WBC: 11.7 10*3/uL — ABNORMAL HIGH (ref 4.0–10.5)
nRBC: 0.8 % — ABNORMAL HIGH (ref 0.0–0.2)

## 2023-04-22 LAB — HEPARIN LEVEL (UNFRACTIONATED): Heparin Unfractionated: 0.4 [IU]/mL (ref 0.30–0.70)

## 2023-04-22 LAB — CK: Total CK: 531 U/L — ABNORMAL HIGH (ref 38–234)

## 2023-04-22 MED ORDER — AMLODIPINE BESYLATE 5 MG PO TABS
5.0000 mg | ORAL_TABLET | Freq: Every day | ORAL | Status: DC
Start: 2023-04-22 — End: 2023-04-26
  Administered 2023-04-22 – 2023-04-26 (×5): 5 mg via ORAL
  Filled 2023-04-22 (×5): qty 1

## 2023-04-22 MED ORDER — DARBEPOETIN ALFA 100 MCG/0.5ML IJ SOSY
100.0000 ug | PREFILLED_SYRINGE | INTRAMUSCULAR | Status: DC
  Administered 2023-04-23: 100 ug via SUBCUTANEOUS
  Filled 2023-04-22: qty 0.5

## 2023-04-22 MED ORDER — SENNOSIDES-DOCUSATE SODIUM 8.6-50 MG PO TABS
1.0000 | ORAL_TABLET | Freq: Every day | ORAL | Status: DC
  Administered 2023-04-22 – 2023-04-25 (×3): 1 via ORAL
  Filled 2023-04-22 (×3): qty 1

## 2023-04-22 NOTE — Progress Notes (Signed)
OT Cancellation Note  Patient Details Name: ALEXSUS WEHRLE MRN: [id number] DOB: 07-02-1975   Cancelled Treatment:     Checked back on patient this pm, patient still reporting nausea and not feeling well.  Will defer OT eval until tomorrow if OK to participate.    Haylen Bellotti OTR/L 04/22/2023, 4:13 PM

## 2023-04-22 NOTE — Progress Notes (Signed)
PHARMACY - ANTICOAGULATION CONSULT NOTE  Pharmacy Consult for heparin Indication: chronic DVT R politeal vein  Allergies  Allergen Reactions   Morphine And Codeine Itching and Swelling   Zestril [Lisinopril] Nausea Only and Swelling    Patient Measurements: Height: 5\' 7"  (170.2 cm) Weight: 105.9 kg (233 lb 7.5 oz) IBW/kg (Calculated) : 61.6 Heparin Dosing Weight: 80.7kg  Vital Signs: Temp: 98 F (36.7 C) (12/29 0541) Temp Source: Axillary (12/29 0541) BP: 123/58 (12/29 0748) Pulse Rate: 78 (12/29 0748)  Labs: Recent Labs     0000 04/20/23 0729 04/20/23 0828 04/20/23 1819 04/21/23 0541 04/21/23 1757 04/22/23 0743  HGB   < >  --  9.0*  --  9.0*  --  8.6*  HCT  --   --  25.4*  --  25.3*  --  24.6*  PLT  --   --  317  --  313  --  290  HEPARINUNFRC  --   --  >1.10*   < > 0.28* 0.32 0.40  CREATININE  --  6.60*  --   --  7.37*  --  5.82*  CKTOTAL  --  549*  --   --  611*  --  531*   < > = values in this interval not displayed.    Estimated Creatinine Clearance: 14.6 mL/min (A) (by C-G formula based on SCr of 5.82 mg/dL (H)).  Assessment: 61 yoF presented to ED with chest pain and shortness of breath. PMH includes: HFrEF (45-50%), CKD Stage 4, CAD s/p PCI, stroke, T2DM, h/o DVT/PE (h/o eliquis >> patient states no longer taking). Pharmacy consulted to dose heparin for history of DVT/PE. Heparin was then changed to SQ heparin 12/24 after VQ scan negative for PE. 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 Pharmacy consulted to restart IV heparin.    12/29 AM: Heparin level 0.40, therapeutic on 900 units/hr. No issues with infusion running or bleeding per RN. Hgb remains low but stable at 8.6, PLT 290.  Goal of Therapy:  Heparin level 0.3-0.7 units/ml Monitor platelets by anticoagulation protocol: Yes   Plan:  Continue heparin infusion at 900 units/hour  Daily heparin level and CBC Monitor for s/sx of bleeding F/U transition to oral [facility]   Thank you  for involving pharmacy in this patient's care.  Enos Fling, PharmD PGY-1 Acute Care Pharmacy Resident 04/22/2023 9:03 AM  Please check AMION for all [facility] Pharmacy phone numbers After 10:00 PM, call Main Pharmacy [phone]

## 2023-04-22 NOTE — Progress Notes (Signed)
OT Cancellation Note  Patient Details Name: Shirley [name] MRN: [id number] DOB: October 26, 1975   Cancelled Treatment:    Reason Eval/Treat Not Completed: Other (comment).  Pt declined OT at this time requesting to come back later secondary to not feeling well and feeling dizzy.  Nursing made aware and will check back later today if possible or see tomorrow.   Karessa Onorato OTR/L 04/22/2023, 8:20 AM

## 2023-04-22 NOTE — Progress Notes (Signed)
PROGRESS NOTE GOWRI GOODIN  MWU:[id number] DOB: 1975/08/26 DOA: 04/15/2023 PCP: Bettey Costa, NP  Brief Narrative/Hospital Course: MALAIJA BOILARD is a 47 y.o. female with a history of CKD stage IV, hypertension, HFmrEF, CAD s/p PCI, stroke, OSA, recurrent VTE.  Patient presented secondary to flu-like symptoms, pleuritic chest pain and chest heaviness found to have influenza A infection. Patient started on Tamiflu for treatment. Hospitalization complicated by acute metabolic encephalopathy in addition to hypotension, requiring a one day admission to ICU. Although encephalopathy and hypotension improved, patient developed worsening AKI on underlying renal disease in addition to anuria/oliguria. 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   Subjective: Patient seen and examined this morning Overnight afebrile BP stable  Blood sugar 131 Underwent HD treatment #2, 12/28 She feels somewhat stronger and feels she is improving every day.   Still having  dry cough but no chest pain shortness of breath.  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 S/P TDC by IR 12/26 HD day #1 12/26, day #2 12/28- did well. Appreciate nephrology on board managing.  Monitor electrolytes fluid status and mental status Avoid nephrotoxic medications including NSAIDs and  iodinated intravenous contrast exposure unless the latter is absolutely indicated.Preferred narcotic agents for pain control VOZ:[id number], fentanyl, and methadone and avoid morphine.Avoid Baclofen and avoid oral sodium phosphate and magnesium citrate based laxatives / bowel preps. Continue strict Input and Output monitoring and serial renal functions. Recent Labs    09/15/22 1405 04/15/23 2122 04/16/23 0800 04/16/23 1603 04/17/23 0804 04/18/23 1050 04/19/23 0655 04/20/23 0729 04/21/23 0541 04/22/23 0743  BUN 48* 42* 44* 46* 58* 75* 85* 67* 69* 50*  CREATININE 2.80* 4.51* 4.73* 5.26* 6.51* 7.79* 8.33* 6.60* 7.37* 5.82*  CO2 22 17* 17* 14* 13* 16* 13* 17* 18* 22  K 4.2 4.5 4.6 4.2 4.6 4.5 4.6 4.1 4.1 3.9    Metabolic acidosis: Due to ESRD-continue HD, off oral bicarb since being on HD.  Continue plan per nephrology  CKD-MBD: continue renal diet monitor PTH  Anemia of CKD Microcytic anemia: Hemoglobin overall stable.  With low iron studies getting IV iron per nephrology.  Monitor and transfuse  if <7gm Recent Labs    08/12/22 0132 08/13/22 0617 04/17/23 0804 04/18/23 1050 04/19/23 0655 04/20/23 0828 04/21/23 0541 04/22/23 0743  HGB 8.3*   < > 8.6*  --  9.2* 9.0* 9.0* 8.6*  MCV 74.5*   < > 79.4*  --  77.5* 76.3* 76.9* 76.4*  FERRITIN 28  --   --  374*  --   --   --   --   TIBC 252  --   --  251  --   --   --   --   IRON 22*  --   --  35  --   --   --   --    < > =  values in this interval not displayed.    Severe sepsis due to influenza Influenza A Infection: She completed Tramiflu 12/26.Blood culture no growth so far.  Remains afebrile no leukocytosis BP stable  Acute metabolic encephalopathy Unclear etiology but presumed secondary to sepsis and complicated by underlying renal disease and uremia.  Overall mental status is improved, now AAOx3.But remains weak  Continue supportive care fall precaution PT OT, delirium precaution   Hypotension Concerned this was  secondary to volume depletion and was give IV fluids while in ICU. BP stable now  Primary hypertension- PTA amlodipine 10 and Coreg 25mg  CAD Angina Stable no chest pain. PTA on Imdur, Lipitor and aspirin.  Resumed Imdur, aspirin  Last cxr-clear. D-dimer elevated at 1.95. V/Q scan obtained and was negative for acute PE. Continue home amlodipine, Coreg, hydralazine tid    HFrEF LVEF of 45-50% from 02/2022.With evidence of lower leg edema but in the setting of fluid overload from worsening renal function.  Address fluid balance w/ HD Net IO Since Admission: 1,816.72 mL [04/22/23 1035]    Chronic DVT Lower extremity venous duplex on 12/24 revealed a right popliteal vein chronic DVT.  On heparin  GTT> if okay with nephrology will transition back to [facility] 12/29.    Diabetes mellitus type 2 Well-controlled A1c, continue SSI and monitor CBG as below Recent Labs  Lab 04/16/23 0545 04/16/23 0805 04/21/23 1708 04/21/23 2035 04/22/23 0036 04/22/23 0500 04/22/23 0747  GLUCAP  --    < > 158* 155* 142* 171* 131*  HGBA1C 6.5*  --   --   --   --   --   --    < > = values in this interval not displayed.    Gastroparesis Noted history.   History of CVA Noted. Patient is on aspirin and Lipitor which are held on admit.    Obesity w/ OSA: Patient's Body mass index is 36.57 kg/m. : Will benefit with PCP follow-up, weight loss  healthy lifestyle and outpatient sleep evaluation.start  Cpap at beditime.  DVT prophylaxis:  Code Status:   Code Status: Full Code Family Communication: plan of care discussed with patient at bedside. Patient status is: Remains hospitalized because of severity of illness Level of care: Med-Surg   Dispo: The patient is from: home with boyfriend            Anticipated disposition: Disposition once cleared by nephrology Objective: Vitals last 24 hrs: Vitals:   04/21/23 1710 04/21/23 2032 04/22/23 0541 04/22/23 0748  BP: 130/73 133/78 137/66 (!) 123/58  Pulse: 72  75 68 78  Resp: 18 18 18 17   Temp:  (!) 97.4 F (36.3 C) 98 F (36.7 C)   TempSrc:  Oral Axillary   SpO2: 99% 100% 96% 98%  Weight:      Height:       Weight change: 0 kg  Physical Examination: General exam: alert awake, obese, oriented HEENT:Oral mucosa moist, Ear/Nose WNL grossly Respiratory system: Bilaterally mild expiratory wheezing,no use of accessory muscle Cardiovascular system: S1 & S2 +, No JVD. Gastrointestinal system: Abdomen soft,NT,ND, BS+ Nervous System: Alert, awake, moving all extremities,and following commands. Extremities: LE edema mild distal peripheral pulses palpable and warm.  Skin: No rashes,no icterus. MSK: Normal muscle bulk,tone, power   TDC+  Medications reviewed: Scheduled Meds:  amLODipine  10 mg Oral Daily   aspirin EC  81 mg Oral Daily   atorvastatin  40 mg Oral Daily   calcium acetate  667 mg Oral TID WC  carvedilol  25 mg Oral BID WC   Chlorhexidine Gluconate Cloth  6 each Topical Daily   Chlorhexidine Gluconate Cloth  6 each Topical Q0600   Chlorhexidine Gluconate Cloth  6 each Topical Q0600   hydrALAZINE  100 mg Oral Q8H   insulin aspart  0-6 Units Subcutaneous Q4H   isosorbide mononitrate  90 mg Oral Daily   pantoprazole (PROTONIX) IV  40 mg Intravenous Q24H   senna-docusate  1 tablet Oral Daily   sodium chloride flush  3-10 mL Intravenous Q12H  Continuous Infusions:  heparin 900 Units/hr (04/21/23 1845)   iron sucrose      Diet Order             Diet renal with fluid restriction Fluid restriction: 1200 mL Fluid; Room service appropriate? Yes; Fluid consistency: Thin  Diet effective now                  Intake/Output Summary (Last 24 hours) at 04/22/2023 1035 Last data filed at 04/21/2023 1539 Gross per 24 hour  Intake 171.75 ml  Output --  Net 171.75 ml   Net IO Since Admission: 1,816.72 mL [04/22/23 1035]  Wt Readings from Last 3 Encounters:  04/21/23 105.9 kg  12/28/22 90.7 kg  09/28/22 90.7 kg     Unresulted  Labs (From admission, onward)     Start     Ordered   04/22/23 0500  Heparin level (unfractionated)  Daily,   R     Question:  Specimen collection method  Answer:  Lab=Lab collect   04/20/23 1913   04/20/23 0500  CBC  Daily,   R     Question:  Specimen collection method  Answer:  Lab=Lab collect   04/18/23 2253   04/19/23 0640  Renal function panel  Daily,   R     Question:  Specimen collection method  Answer:  Lab=Lab collect   04/19/23 0639   04/18/23 1017  CK  Daily,   R     Question:  Specimen collection method  Answer:  Lab=Lab collect   04/18/23 1016   04/16/23 1703  Rapid urine drug screen (hospital performed)  Add-on,   AD        04/16/23 1702   Unscheduled  Occult blood card to lab, stool  As needed,   R      04/18/23 1740   Signed and Held  Renal function panel  Once,   R       Question:  Specimen collection method  Answer:  Lab=Lab collect   Signed and Held   Signed and Held  CBC  Once,   R       Question:  Specimen collection method  Answer:  Lab=Lab collect   Signed and Held          Data Reviewed: I have personally reviewed following labs and imaging studies CBC: Recent Labs  Lab 04/15/23 2122 04/16/23 0800 04/17/23 0804 04/19/23 0655 04/20/23 0828 04/21/23 0541 04/22/23 0743  WBC 17.5* 15.7* 11.1* 13.9* 11.9* 10.6* 11.7*  NEUTROABS 14.5* 14.1*  --   --   --   --   --   HGB 9.4* 10.5* 8.6* 9.2* 9.0* 9.0* 8.6*  HCT 28.1* 31.0* 25.5* 26.8* 25.4* 25.3* 24.6*  MCV 79.8* 80.1 79.4* 77.5* 76.3* 76.9* 76.4*  PLT 390 411* 335 364 317 313 290   Basic Metabolic Panel:  Recent Labs  Lab 04/15/23 2122 04/15/23 2327 04/16/23 0800 04/16/23 1603 04/18/23 1050 04/19/23 9147  04/20/23 0729 04/21/23 0541 04/22/23 0743  NA  --   --  138   < > 137 138 134* 135 135  K  --   --  4.6   < > 4.5 4.6 4.1 4.1 3.9  CL  --   --  111   < > 112* 112* 104 104 102  CO2  --   --  17*   < > 16* 13* 17* 18* 22  GLUCOSE  --   --  155*   < > 118* 101* 145* 127* 135*  BUN  --    --  44*   < > 75* 85* 67* 69* 50*  CREATININE  --   --  4.73*   < > 7.79* 8.33* 6.60* 7.37* 5.82*  CALCIUM  --   --  8.6*   < > 8.2* 8.2* 7.5* 7.6* 7.8*  MG  --  1.9 1.9  --   --   --   --   --   --   PHOS   < >  --  4.8*  --  7.5* 7.9* 6.7* 6.2* 4.5   < > = values in this interval not displayed.   GFR: Estimated Creatinine Clearance: 14.6 mL/min (A) (by C-G formula based on SCr of 5.82 mg/dL (H)). Liver Function Tests:  Recent Labs  Lab 04/15/23 2122 04/16/23 0800 04/16/23 1603 04/18/23 1050 04/19/23 0655 04/20/23 0729 04/21/23 0541 04/22/23 0743  AST 38 35 26  --   --   --   --   --   ALT 14 15 11   --   --   --   --   --   ALKPHOS 64 66 53  --   --   --   --   --   BILITOT 1.2* 0.5 0.6  --   --   --   --   --   PROT 7.0 7.5 6.0*  --   --   --   --   --   ALBUMIN 2.5* 2.6* 2.1* 2.3* 2.3* 2.0* 2.1* 2.1*   Recent Labs  Lab 04/16/23 0800  LIPASE 24  No results for input(s): "AMMONIA" in the last 168 hours. Recent Labs  Lab 04/16/23 0216 04/16/23 0417 04/16/23 0551 04/16/23 1603 04/16/23 1902  PROCALCITON 0.12  --   --   --  0.33  LATICACIDVEN  --  0.6 0.5 1.3 1.2   COVID negative on 04/15/2023 9:23 PM RSV virus negative Blood culture no growth from 04/16/2023 at 2:16 AM MRSA PCR negative on 12/23 5:07 PM.  Antimicrobials/Microbiology: Anti-infectives (From admission, onward)    Start     Dose/Rate Route Frequency Ordered Stop   04/19/23 1550  ceFAZolin (ANCEF) IVPB 2g/100 mL premix        over 30 Minutes Intravenous Continuous PRN 04/19/23 1550 04/19/23 1613   04/19/23 1115  ceFAZolin (ANCEF) IVPB 2g/100 mL premix       Note to Pharmacy: TO BE GIVEN IR   2 g 200 mL/hr over 30 Minutes Intravenous To Radiology 04/19/23 1022 04/19/23 1643   04/19/23 1000  oseltamivir (TAMIFLU) capsule 30 mg        30 mg Oral  Once 04/19/23 0641 04/19/23 0831   04/16/23 1300  oseltamivir (TAMIFLU) capsule 30 mg  Status:  Discontinued        30 mg Oral Daily 04/16/23 1252 04/18/23  1403         Component Value Date/Time   SDES  BLOOD SITE NOT SPECIFIED 04/16/2023 0226   SPECREQUEST  04/16/2023 0226    BOTTLES DRAWN AEROBIC AND ANAEROBIC Blood Culture results may not be optimal due to an inadequate volume of blood received in culture bottles   CULT  04/16/2023 0226    NO GROWTH 5 DAYS Performed at [facility], [address] [place], [place] [address]    REPTSTATUS 04/21/2023 FINAL 04/16/2023 6045  Radiology Studies: No results found.  LOS: 7 days  Total time spent in review of labs and imaging, patient evaluation, formulation of plan, documentation and communication with family: 35 minutes  Lanae Boast, MD [facility]  04/22/2023, 10:35 AM

## 2023-04-22 NOTE — Progress Notes (Signed)
[facility] Progress Note    Assessment/ Plan:   CKD5, progressed to ESRD -Dr. Malen Gauze discussed with patient's daughter on 12/25 in regards to starting HD and they did consent - HD#1 12/27 early AM , s/p TDC with IR  - renal navigator involved  -tentatively planning on HD#2 12/28 then #3 on Monday 12/30 -  will need VVS consult early next week for AVF-  from what I see today could not do home dialysis -  is also hep B positive so will need isolation room   Severe sepsis Influenza A -per primary service-  slow to improve  HTN -UF as tolerated with HD. BP acceptable norvasc 10, coreg 25 BID, hydral 100 tid and PRNs-  hopefully will be able to wean BP meds when volume status is better-  will try to dec norvasc to 5  Metabolic acidosis - improving  HFrEF -UF as tolerated with HD  DM2 -Per primary  Anemia of CKD -transfuse prn for hgb <7 - Fe panel low, treat with IV iron first-  hgb 9-10 -  dropped below 9 today -  will add ESA   Chronic DVT -on heparin gtt -per primary  CKD MBD -renal diet-  added phoslo for binding  -checking PTH -  is 273- no meds yet   Subjective:   HD yest- removed 1000 - labs reflect HD -  is still just kind of moaning but able to participate in conversation better today    Objective:   BP (!) 123/58 (BP Location: Left Arm)   Pulse 78   Temp 98 F (36.7 C) (Axillary)   Resp 17   Ht 5\' 7"  (1.702 m)   Wt 105.9 kg   LMP 01/22/2021 Comment: neg preg test 04/17/21  SpO2 98%   BMI 36.57 kg/m   Intake/Output Summary (Last 24 hours) at 04/22/2023 1025 Last data filed at 04/21/2023 1539 Gross per 24 hour  Intake 171.75 ml  Output --  Net 171.75 ml   Weight change: 0 kg  Physical Exam: Gen: ill appearing, NAD, drowsy CVS: RRR Resp: decreased breath sounds bibasilar, normal wob Abd: soft Ext: trace  edema b/l Les Neuro: drowsy, intermittently answering questions TDC in place   Imaging: No results  found.   Labs: BMET Recent Labs  Lab 04/16/23 0800 04/16/23 1603 04/17/23 0804 04/18/23 1050 04/19/23 0655 04/20/23 0729 04/21/23 0541 04/22/23 0743  NA 138 138 140 137 138 134* 135 135  K 4.6 4.2 4.6 4.5 4.6 4.1 4.1 3.9  CL 111 113* 113* 112* 112* 104 104 102  CO2 17* 14* 13* 16* 13* 17* 18* 22  GLUCOSE 155* 120* 142* 118* 101* 145* 127* 135*  BUN 44* 46* 58* 75* 85* 67* 69* 50*  CREATININE 4.73* 5.26* 6.51* 7.79* 8.33* 6.60* 7.37* 5.82*  CALCIUM 8.6* 8.3* 8.5* 8.2* 8.2* 7.5* 7.6* 7.8*  PHOS 4.8*  --   --  7.5* 7.9* 6.7* 6.2* 4.5   CBC Recent Labs  Lab 04/15/23 2122 04/16/23 0800 04/17/23 0804 04/19/23 0655 04/20/23 0828 04/21/23 0541 04/22/23 0743  WBC 17.5* 15.7*   < > 13.9* 11.9* 10.6* 11.7*  NEUTROABS 14.5* 14.1*  --   --   --   --   --   HGB 9.4* 10.5*   < > 9.2* 9.0* 9.0* 8.6*  HCT 28.1* 31.0*   < > 26.8* 25.4* 25.3* 24.6*  MCV 79.8* 80.1   < > 77.5* 76.3* 76.9* 76.4*  PLT 390 411*   < >  364 317 313 290   < > = values in this interval not displayed.    Medications:     amLODipine  10 mg Oral Daily   aspirin EC  81 mg Oral Daily   atorvastatin  40 mg Oral Daily   calcium acetate  667 mg Oral TID WC   carvedilol  25 mg Oral BID WC   Chlorhexidine Gluconate Cloth  6 each Topical Daily   Chlorhexidine Gluconate Cloth  6 each Topical Q0600   Chlorhexidine Gluconate Cloth  6 each Topical Q0600   hydrALAZINE  100 mg Oral Q8H   insulin aspart  0-6 Units Subcutaneous Q4H   isosorbide mononitrate  90 mg Oral Daily   pantoprazole (PROTONIX) IV  40 mg Intravenous Q24H   senna-docusate  1 tablet Oral Daily   sodium chloride flush  3-10 mL Intravenous Q12H      [name]  [employer] 04/22/2023, 10:25 AM

## 2023-04-23 DIAGNOSIS — R0789 Other chest pain: Secondary | ICD-10-CM | POA: Diagnosis not present

## 2023-04-23 LAB — GLUCOSE, CAPILLARY
Glucose-Capillary: 104 mg/dL — ABNORMAL HIGH (ref 70–99)
Glucose-Capillary: 118 mg/dL — ABNORMAL HIGH (ref 70–99)
Glucose-Capillary: 126 mg/dL — ABNORMAL HIGH (ref 70–99)
Glucose-Capillary: 162 mg/dL — ABNORMAL HIGH (ref 70–99)
Glucose-Capillary: 177 mg/dL — ABNORMAL HIGH (ref 70–99)
Glucose-Capillary: 180 mg/dL — ABNORMAL HIGH (ref 70–99)
Glucose-Capillary: 271 mg/dL — ABNORMAL HIGH (ref 70–99)
Glucose-Capillary: 97 mg/dL (ref 70–99)

## 2023-04-23 LAB — RENAL FUNCTION PANEL
Albumin: 2.2 g/dL — ABNORMAL LOW (ref 3.5–5.0)
Anion gap: 9 (ref 5–15)
BUN: 53 mg/dL — ABNORMAL HIGH (ref 6–20)
CO2: 22 mmol/L (ref 22–32)
Calcium: 8.1 mg/dL — ABNORMAL LOW (ref 8.9–10.3)
Chloride: 105 mmol/L (ref 98–111)
Creatinine, Ser: 6.46 mg/dL — ABNORMAL HIGH (ref 0.44–1.00)
GFR, Estimated: 7 mL/min — ABNORMAL LOW (ref >60)
Glucose, Bld: 102 mg/dL — ABNORMAL HIGH (ref 70–99)
Phosphorus: 5.1 mg/dL — ABNORMAL HIGH (ref 2.5–4.6)
Potassium: 4.2 mmol/L (ref 3.5–5.1)
Sodium: 136 mmol/L (ref 135–145)

## 2023-04-23 LAB — CBC
HCT: 24.6 % — ABNORMAL LOW (ref 36.0–46.0)
Hemoglobin: 8.7 g/dL — ABNORMAL LOW (ref 12.0–15.0)
MCH: 27 pg (ref 26.0–34.0)
MCHC: 35.4 g/dL (ref 30.0–36.0)
MCV: 76.4 fL — ABNORMAL LOW (ref 80.0–100.0)
Platelets: 298 10*3/uL (ref 150–400)
RBC: 3.22 MIL/uL — ABNORMAL LOW (ref 3.87–5.11)
RDW: 16.2 % — ABNORMAL HIGH (ref 11.5–15.5)
WBC: 15.8 10*3/uL — ABNORMAL HIGH (ref 4.0–10.5)
nRBC: 0.4 % — ABNORMAL HIGH (ref 0.0–0.2)

## 2023-04-23 LAB — CK: Total CK: 336 U/L — ABNORMAL HIGH (ref 38–234)

## 2023-04-23 LAB — HEPARIN LEVEL (UNFRACTIONATED): Heparin Unfractionated: 0.24 [IU]/mL — ABNORMAL LOW (ref 0.30–0.70)

## 2023-04-23 MED ORDER — HEPARIN SODIUM (PORCINE) 1000 UNIT/ML IJ SOLN
3800.0000 [IU] | INTRAMUSCULAR | Status: DC | PRN
Start: 2023-04-23 — End: 2023-04-26
  Administered 2023-04-23 – 2023-04-25 (×2): 3800 [IU] via INTRAVENOUS
  Filled 2023-04-23 (×2): qty 4

## 2023-04-23 NOTE — Progress Notes (Signed)
[facility] Progress Note    Assessment/ Plan:   CKD5, progressed to ESRD -Dr. Malen Gauze discussed with patient's daughter on 12/25 in regards to starting HD and they did consent - HD#1 12/27 early AM , s/p TDC with IR  - renal navigator involved  -tentatively planning on HD#2 12/28 then #3 on Monday 12/30 -  Hep B + - CLIP in process  Severe sepsis Influenza A -per primary service-  slow to improve  HTN -UF as tolerated with HD. BP acceptable norvasc 10, coreg 25 BID, hydral 100 tid and PRNs-  hopefully will be able to wean BP meds when volume status is better-  will try to dec norvasc to 5  Metabolic acidosis - improving  HFrEF -UF as tolerated with HD  DM2 -Per primary  Anemia of CKD -transfuse prn for hgb <7 - Fe panel low, treat with IV iron first-  hgb 9-10 -  dropped below 9 today -  will add ESA   Chronic DVT -on heparin gtt -per primary  CKD MBD -renal diet-  added phoslo for binding  -checking PTH -  is 273- no meds yet   Hep B + - needs iso room  Subjective:    For HD today- seen just before starting.  Says she's not feeling the best today.  Hopeful for CLIP   Objective:   BP (!) 173/97 (BP Location: Right Arm)   Pulse 81   Temp 97.9 F (36.6 C) (Oral)   Resp 18   Ht 5\' 7"  (1.702 m)   Wt 105.9 kg   LMP 01/22/2021 Comment: neg preg test 04/17/21  SpO2 98%   BMI 36.57 kg/m   Intake/Output Summary (Last 24 hours) at 04/23/2023 0851 Last data filed at 04/23/2023 0400 Gross per 24 hour  Intake 514.33 ml  Output --  Net 514.33 ml   Weight change: -1 kg  Physical Exam: Gen: ill appearing, NAD, drowsy CVS: RRR Resp: decreased breath sounds bibasilar, normal wob Abd: soft Ext: 1+ upper and lower extremities Neuro: drowsy, intermittently answering questions TDC in place   Imaging: No results found.   Labs: BMET Recent Labs  Lab 04/17/23 0804 04/18/23 1050 04/19/23 0655 04/20/23 0729 04/21/23 0541 04/22/23 0743  04/23/23 0714  NA 140 137 138 134* 135 135 136  K 4.6 4.5 4.6 4.1 4.1 3.9 4.2  CL 113* 112* 112* 104 104 102 105  CO2 13* 16* 13* 17* 18* 22 22  GLUCOSE 142* 118* 101* 145* 127* 135* 102*  BUN 58* 75* 85* 67* 69* 50* 53*  CREATININE 6.51* 7.79* 8.33* 6.60* 7.37* 5.82* 6.46*  CALCIUM 8.5* 8.2* 8.2* 7.5* 7.6* 7.8* 8.1*  PHOS  --  7.5* 7.9* 6.7* 6.2* 4.5 5.1*   CBC Recent Labs  Lab 04/20/23 0828 04/21/23 0541 04/22/23 0743 04/23/23 0714  WBC 11.9* 10.6* 11.7* 15.8*  HGB 9.0* 9.0* 8.6* 8.7*  HCT 25.4* 25.3* 24.6* 24.6*  MCV 76.3* 76.9* 76.4* 76.4*  PLT 317 313 290 298    Medications:     amLODipine  5 mg Oral Daily   aspirin EC  81 mg Oral Daily   atorvastatin  40 mg Oral Daily   calcium acetate  667 mg Oral TID WC   carvedilol  25 mg Oral BID WC   Chlorhexidine Gluconate Cloth  6 each Topical Daily   Chlorhexidine Gluconate Cloth  6 each Topical Q0600   Chlorhexidine Gluconate Cloth  6 each Topical Q0600   darbepoetin (ARANESP) injection -  DIALYSIS  100 mcg Subcutaneous Q Mon-1800   hydrALAZINE  100 mg Oral Q8H   insulin aspart  0-6 Units Subcutaneous Q4H   isosorbide mononitrate  90 mg Oral Daily   pantoprazole (PROTONIX) IV  40 mg Intravenous Q24H   senna-docusate  1 tablet Oral Daily   sodium chloride flush  3-10 mL Intravenous Q12H      Bufford Buttner   [facility] 04/23/2023, 8:51 AM

## 2023-04-23 NOTE — Progress Notes (Signed)
OT Cancellation Note  Patient Details Name: Shirley [name] MRN: [id number] DOB: 1975/05/08   Cancelled Treatment:    Reason Eval/Treat Not Completed: Patient at procedure or test/ unavailable (Pt currently in HD.)  Evern Bio 04/23/2023, 10:40 AM Berna Spare, OTR/L Acute Rehabilitation Services Office: [phone]

## 2023-04-23 NOTE — Progress Notes (Addendum)
Contacted [facility] admissions to request an update on pt's referral. Will await a response.   Olivia Canter Renal Navigator [phone]  Addendum at 1:59 pm: Pt has been accepted at [facility] [place] on MWF 10:25 am chair time. Pt can start on Friday, Jan 3 and will need to arrive at 9:30 am to complete paperwork prior to treatment. Clinic is aware pt is Hep B +. Unable to reach pt and pt will some confusion on Friday. Contacted pt's daughter, De'Azia, via phone to go over out-pt arrangements. Daughter agreeable. Navigator will attempt to meet with pt prior to d/c if possible. Arrangements added to AVS as well. Update provided to attending, nephrologist, pt's RN, and RN CM. Will assist as needed.

## 2023-04-23 NOTE — Evaluation (Signed)
Occupational Therapy Evaluation Patient Details Name: Shirley [name] MRN: [id number] DOB: Aug 22, 1975 Today's Date: 04/23/2023   History of Present Illness 47 y.o. female presented 12/22 secondary to flu-like symptoms, pleuritic chest pain and chest heaviness found to have influenza A infection. Hospitalization complicated by acute metabolic encephalopathy in addition to hypotension. 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 with a history of CKD stage IV, hypertension, HFmrEF, CAD s/p PCI, stroke, OSA, recurrent VTE.   Clinical Impression   Limited evaluation after pt returned from HD. Pt was walking without AD prior to admission. She lives with her boyfriend who assisted as needed with ADLs and IADLs, pt unable to offer specifics as to how much assistance he provided. Pt reports weakness and generalized pain, appearing restless. Agreeable to sitting EOB with assist to raise trunk and for LEs back into bed. Pt groomed at EOB with CGA. She requires set up to total assist for ADLs. Returned to supine with pt attempting to finish her lunch. Pt with impaired memory, problem solving and slow to follow commands. She is unable to state if her memory was impaired prior to admission. Patient will benefit from continued inpatient follow up therapy, <3 hours/day.       If plan is discharge home, recommend the following: A lot of help with bathing/dressing/bathroom;Assistance with cooking/housework;Assist for transportation;Direct supervision/assist for medications management;Direct supervision/assist for financial management;Help with stairs or ramp for entrance;A Labine help with walking and/or transfers    Functional Status Assessment  Patient has had a recent decline in their functional status and demonstrates the ability to make significant improvements in function in a reasonable and predictable amount of time.  Equipment Recommendations  BSC/3in1     Recommendations for Other Services       Precautions / Restrictions Precautions Precautions: Fall Restrictions Weight Bearing Restrictions Per Provider Order: No      Mobility Bed Mobility Overal bed mobility: Needs Assistance Bed Mobility: Supine to Sit, Sit to Supine     Supine to sit: Min assist, HOB elevated Sit to supine: Mod assist   General bed mobility comments: assist to raise trunk and for LEs back into bed, increased time, HOB up    Transfers                   General transfer comment: pt deferred standing trials from EOB      Balance Overall balance assessment: Needs assistance   Sitting balance-Leahy Scale: Fair Sitting balance - Comments: CGA                                   ADL either performed or assessed with clinical judgement   ADL Overall ADL's : Needs assistance/impaired Eating/Feeding: Independent;Bed level   Grooming: Wash/dry hands;Wash/dry face;Sitting;Set up   Upper Body Bathing: Moderate assistance;Sitting   Lower Body Bathing: Total assistance;Sit to/from stand   Upper Body Dressing : Moderate assistance;Sitting   Lower Body Dressing: Total assistance;Sit to/from stand                 General ADL Comments: Pt with decreased activity tolerance, weakness and generalized pain. Moaning     Vision Ability to See in Adequate Light: 0 Adequate Patient Visual Report: No change from baseline       Perception         Praxis  Pertinent Vitals/Pain Pain Assessment Pain Assessment: Faces Faces Pain Scale: Hurts even more Pain Location: generalized Pain Descriptors / Indicators: Moaning Pain Intervention(s): Monitored during session, Repositioned     Extremity/Trunk Assessment Upper Extremity Assessment Upper Extremity Assessment: Generalized weakness   Lower Extremity Assessment Lower Extremity Assessment: Defer to PT evaluation   Cervical / Trunk Assessment Cervical / Trunk  Assessment: Normal   Communication Communication Communication: No apparent difficulties   Cognition Arousal: Alert Behavior During Therapy: Flat affect Overall Cognitive Status: Impaired/Different from baseline Area of Impairment: Memory, Problem solving                     Memory: Decreased short-term memory       Problem Solving: Slow processing, Decreased initiation, Requires verbal cues       General Comments       Exercises     Shoulder Instructions      Home Living Family/patient expects to be discharged to:: Private residence Living Arrangements: Spouse/significant other Available Help at Discharge: Available PRN/intermittently;Friend(s) (boyfriend works) Type of Home: Apartment Home Access: Level entry     Home Layout: One level     Bathroom Shower/Tub: Chief Strategy Officer: Handicapped height     Home Equipment: None          Prior Functioning/Environment               Mobility Comments: No AD, denies falls ADLs Comments: sponge bathes, boyfriend assists as needed with back and feet        OT Problem List: Decreased strength;Decreased activity tolerance;Impaired balance (sitting and/or standing);Decreased cognition;Decreased knowledge of use of DME or AE;Obesity      OT Treatment/Interventions: Self-care/ADL training;Therapeutic activities;Patient/family education;Balance training;DME and/or AE instruction;Cognitive remediation/compensation    OT Goals(Current goals can be found in the care plan section) Acute Rehab OT Goals OT Goal Formulation: With patient Time For Goal Achievement: 05/07/23 Potential to Achieve Goals: Good ADL Goals Pt Will Perform Grooming: with contact guard assist;standing (one activity) Pt Will Perform Upper Body Dressing: with set-up;sitting Pt Will Perform Lower Body Dressing: with mod assist;sit to/from stand Pt Will Transfer to Toilet: with contact guard assist;ambulating;bedside  commode Pt Will Perform Toileting - Clothing Manipulation and hygiene: with contact guard assist;sit to/from stand  OT Frequency: Min 1X/week    Co-evaluation              AM-PAC OT "6 Clicks" Daily Activity     Outcome Measure Help from another person eating meals?: None Help from another person taking care of personal grooming?: A Spenser Help from another person toileting, which includes using toliet, bedpan, or urinal?: A Lot Help from another person bathing (including washing, rinsing, drying)?: A Lot Help from another person to put on and taking off regular upper body clothing?: A Koelzer Help from another person to put on and taking off regular lower body clothing?: Total 6 Click Score: 15   End of Session Equipment Utilized During Treatment: Gait belt;Rolling walker (2 wheels) Nurse Communication: Other (comment) (aware IV is alarming)  Activity Tolerance: Patient limited by fatigue;No increased pain Patient left: in bed;with call bell/phone within reach;with bed alarm set  OT Visit Diagnosis: Muscle weakness (generalized) (M62.81);Pain;Other symptoms and signs involving cognitive function                Time: 1445-1501 OT Time Calculation (min): 16 min Charges:  OT General Charges $OT Visit: 1 Visit OT Evaluation $OT Eval Moderate Complexity:  1 Mod Berna Spare, OTR/L Acute Rehabilitation Services Office: [phone]  Evern Bio 04/23/2023, 3:20 PM

## 2023-04-23 NOTE — Progress Notes (Signed)
PROGRESS NOTE Shirley [name]  ZOX:[id number] DOB: 1975-12-07 DOA: 04/15/2023 PCP: Bettey Costa, NP  Brief Narrative/Hospital Course: Shirley [name] is a 47 y.o. female with a history of CKD stage IV, hypertension, HFmrEF, CAD s/p PCI, stroke, OSA, recurrent VTE.  Patient presented secondary to flu-like symptoms, pleuritic chest pain and chest heaviness found to have influenza A infection. Patient started on Tamiflu for treatment. Hospitalization complicated by acute metabolic encephalopathy in addition to hypotension, requiring a one day admission to ICU. Although encephalopathy and hypotension improved, patient developed worsening AKI on underlying renal disease in addition to anuria/oliguria. 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   Subjective: Patient seen and examined Past 24 hrs- afebrile, on RA. For HD # 3 12/30-and tolerated well-total UF removed 2.1 L Overall feels better still having some congestion- and cough but on room air  Assessment and Plan: Principal Problem:   Atypical chest pain Active Problems:   Essential hypertension   DM2 (diabetes mellitus, type 2) (HCC)   HLD (hyperlipidemia)   Chronic iron deficiency anemia   GERD (gastroesophageal reflux disease)   Leukocytosis   Severe sepsis (HCC)   Acute renal failure superimposed on stage 4 chronic kidney disease (HCC)   Chronic systolic CHF (congestive heart failure) (HCC)   Influenza A   Elevated troponin   Influenza   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 S/P TDC by IR 12/26 HD day #1 12/26, day #2 12/28-  for HD #3 12/30. Planning for VVS consult for AVF- cont heparin until then. Cont plan per Nephro CLIP in procedd She is Hep B+ will need isolation room for HD, it looks like she will not be able to home  HD. Recent Labs    04/15/23 2122 04/16/23 0800 04/16/23 1603 04/17/23 0804 04/18/23 1050 04/19/23 0655 04/20/23 0729 04/21/23 0541 04/22/23 0743 04/23/23 0714  BUN 42* 44* 46* 58* 75* 85* 67* 69* 50* 53*  CREATININE 4.51* 4.73* 5.26* 6.51* 7.79* 8.33* 6.60* 7.37* 5.82* 6.46*  CO2 17* 17* 14* 13* 16* 13* 17* 18* 22 22  K 4.5 4.6 4.2 4.6 4.5 4.6 4.1 4.1 3.9 4.2    Metabolic acidosis: Due to ESRD-continue HD, off oral bicarb since being on HD.  Continue plan per nephrology  CKD-MBD: continue renal diet monitor PTH  Anemia of CKD Microcytic anemia: Hb stable.  With low iron studies getting IV iron per nephrology.  Monitor and transfuse  if <7gm Recent Labs    08/12/22 0132 08/13/22 0617 04/18/23 1050 04/19/23 0655 04/20/23 0828 04/21/23 0541 04/22/23 0743 04/23/23 0714  HGB 8.3*   < >  --  9.2* 9.0* 9.0* 8.6* 8.7*  MCV 74.5*   < >  --  77.5* 76.3* 76.9* 76.4* 76.4*  FERRITIN 28  --  374*  --   --   --   --   --   TIBC 252  --  251  --   --   --   --   --   IRON 22*  --  35  --   --   --   --   --    < > = values in this interval not displayed.    Severe sepsis due to influenza Influenza A Infection: S/P Tramiflu 12/26.Blood culture no growth so far.  Remains afebrile  no leukocytosis BP stable monitor.  Acute metabolic encephalopathy Unclear etiology but presumed secondary to sepsis and complicated by underlying renal disease and uremia.  Overall mental status is improved, now AAOx3.But remains weak  Continue supportive care fall precaution PT OT, delirium precaution   Hypotension Concerned this was secondary to volume depletion and was give IV fluids while in ICU. BP stable now  Primary hypertension- PTA amlodipine 10 and Coreg 25mg  CAD Angina Stable no chest pain. PTA on Imdur, Lipitor and aspirin.  Resumed Imdur, aspirin  Last cxr-clear. D-dimer elevated at 1.95. V/Q scan obtained and was negative for acute PE. Continue home amlodipine, Coreg, hydralazine tid  -hopefully can wean down meds as volume status improves-amlodipine decreased from 10 to 5 mg 12/30   HFrEF LVEF of 45-50% from 02/2022.With evidence of lower leg edema but in the setting of fluid overload from worsening renal function.  Continue to optimize volume status with dialysis total UF removed 2.1 L today Net IO Since Admission: 231.05 mL [04/23/23 1333]    Chronic DVT Lower extremity venous duplex on 12/24 revealed a right popliteal vein chronic DVT.  On heparin  GTT-continue to hold off conversion to Eliquis pending further vascular eval   Diabetes mellitus type 2 Well-controlled A1c, continue SSI and monitor CBG as below Recent Labs  Lab 04/22/23 1559 04/22/23 1952 04/23/23 0011 04/23/23 0528 04/23/23 0812  GLUCAP 115* 140* 126* 104* 118*    Gastroparesis Noted history.   History of CVA Noted. Patient is on aspirin and Lipitor which are held on admit.  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  Obesity w/ OSA: Patient's Body mass index is 36.26 kg/m. : Will benefit with PCP follow-up, weight loss  healthy lifestyle and outpatient sleep evaluation.start  Cpap at beditime.  DVT prophylaxis:  Code Status:   Code Status: Full Code Family Communication: plan of care discussed with patient at bedside. Patient status is: Remains hospitalized because of severity of illness Level of care: Med-Surg   Dispo: The patient is from: home with boyfriend            Anticipated disposition: Disposition once cleared by nephrology Objective: Vitals last 24 hrs: Vitals:   04/23/23 1138 04/23/23 1145 04/23/23 1208 04/23/23 1223  BP: (!) 155/90  (!) 154/88 (!) 160/88  Pulse: 77 80 89 86  Resp: 11 10 19 14   Temp:    97.7 F (36.5 C)  TempSrc:      SpO2: 93% 94% 93% 93%  Weight:   105 kg   Height:       Weight change: -1 kg  Physical Examination: General exam: alert awake,  HEENT:Oral mucosa moist, Ear/Nose WNL grossly Respiratory system: Bilaterally clear BS,no  use of accessory muscle Cardiovascular system: S1 & S2 +, No JVD. Gastrointestinal system: Abdomen soft,NT,ND, BS+ Nervous System: Alert, awake, moving all extremities,and following commands. Extremities: LE edema neg,distal peripheral pulses palpable and warm.  Skin: No rashes,no icterus. MSK: Normal muscle bulk,tone, power   TDC+  Medications reviewed: Scheduled Meds:  amLODipine  5 mg Oral Daily   aspirin EC  81 mg Oral Daily   atorvastatin  40 mg Oral Daily   calcium acetate  667 mg Oral TID WC   carvedilol  25 mg Oral BID WC   Chlorhexidine Gluconate Cloth  6 each Topical Daily   Chlorhexidine Gluconate Cloth  6 each Topical Q0600   Chlorhexidine Gluconate Cloth  6 each Topical Q0600   darbepoetin (ARANESP) injection - DIALYSIS  100 mcg Subcutaneous Q Mon-1800   hydrALAZINE  100 mg Oral Q8H   insulin aspart  0-6 Units Subcutaneous Q4H   isosorbide mononitrate  90 mg Oral Daily   pantoprazole (PROTONIX) IV  40 mg Intravenous Q24H   senna-docusate  1 tablet Oral Daily   sodium chloride flush  3-10 mL Intravenous Q12H  Continuous Infusions:  heparin 900 Units/hr (04/23/23 0338)   heparin sodium (porcine)     iron sucrose 200 mg (04/22/23 1615)    Diet Order             Diet renal with fluid restriction Fluid restriction: 1200 mL Fluid; Room service appropriate? Yes; Fluid consistency: Thin  Diet effective now                  Intake/Output Summary (Last 24 hours) at 04/23/2023 1333 Last data filed at 04/23/2023 1223 Gross per 24 hour  Intake 514.33 ml  Output 2100 ml  Net -1585.67 ml   Net IO Since Admission: 231.05 mL [04/23/23 1333]  Wt Readings from Last 3 Encounters:  04/23/23 105 kg  12/28/22 90.7 kg  09/28/22 90.7 kg     Unresulted Labs (From admission, onward)     Start     Ordered   04/22/23 0500  Heparin level (unfractionated)  Daily,   R     Question:  Specimen collection method  Answer:  Lab=Lab collect   04/20/23 1913   04/20/23 0500  CBC   Daily,   R     Question:  Specimen collection method  Answer:  Lab=Lab collect   04/18/23 2253   04/18/23 1017  CK  Daily,   R     Question:  Specimen collection method  Answer:  Lab=Lab collect   04/18/23 1016   Unscheduled  Occult blood card to lab, stool  As needed,   R      04/18/23 1740   Signed and Held  Renal function panel  Once,   R       Question:  Specimen collection method  Answer:  Lab=Lab collect   Signed and Held   Signed and Held  CBC  Once,   R       Question:  Specimen collection method  Answer:  Lab=Lab collect   Signed and Held          Data Reviewed: I have personally reviewed following labs and imaging studies CBC: Recent Labs  Lab 04/19/23 0655 04/20/23 0828 04/21/23 0541 04/22/23 0743 04/23/23 0714  WBC 13.9* 11.9* 10.6* 11.7* 15.8*  HGB 9.2* 9.0* 9.0* 8.6* 8.7*  HCT 26.8* 25.4* 25.3* 24.6* 24.6*  MCV 77.5* 76.3* 76.9* 76.4* 76.4*  PLT 364 317 313 290 298   Basic Metabolic Panel:  Recent Labs  Lab 04/19/23 0655 04/20/23 0729 04/21/23 0541 04/22/23 0743 04/23/23 0714  NA 138 134* 135 135 136  K 4.6 4.1 4.1 3.9 4.2  CL 112* 104 104 102 105  CO2 13* 17* 18* 22 22  GLUCOSE 101* 145* 127* 135* 102*  BUN 85* 67* 69* 50* 53*  CREATININE 8.33* 6.60* 7.37* 5.82* 6.46*  CALCIUM 8.2* 7.5* 7.6* 7.8* 8.1*  PHOS 7.9* 6.7* 6.2* 4.5 5.1*   GFR: Estimated Creatinine Clearance: 13.1 mL/min (A) (by C-G formula based on SCr of 6.46 mg/dL (H)). Liver Function Tests:  Recent Labs  Lab 04/16/23 1603 04/18/23 1050 04/19/23 0655 04/20/23 0729 04/21/23 0541 04/22/23 0743 04/23/23 0714  AST 26  --   --   --   --   --   --  ALT 11  --   --   --   --   --   --   ALKPHOS 53  --   --   --   --   --   --   BILITOT 0.6  --   --   --   --   --   --   PROT 6.0*  --   --   --   --   --   --   ALBUMIN 2.1*   < > 2.3* 2.0* 2.1* 2.1* 2.2*   < > = values in this interval not displayed.   No results for input(s): "LIPASE", "AMYLASE" in the last 168 hours. No  results for input(s): "AMMONIA" in the last 168 hours. Recent Labs  Lab 04/16/23 1603 04/16/23 1902  PROCALCITON  --  0.33  LATICACIDVEN 1.3 1.2   COVID negative on 04/15/2023 9:23 PM RSV virus negative Blood culture no growth from 04/16/2023 at 2:16 AM MRSA PCR negative on 12/23 5:07 PM.  Antimicrobials/Microbiology: Anti-infectives (From admission, onward)    Start     Dose/Rate Route Frequency Ordered Stop   04/19/23 1550  ceFAZolin (ANCEF) IVPB 2g/100 mL premix        over 30 Minutes Intravenous Continuous PRN 04/19/23 1550 04/19/23 1613   04/19/23 1115  ceFAZolin (ANCEF) IVPB 2g/100 mL premix       Note to Pharmacy: TO BE GIVEN IR   2 g 200 mL/hr over 30 Minutes Intravenous To Radiology 04/19/23 1022 04/19/23 1643   04/19/23 1000  oseltamivir (TAMIFLU) capsule 30 mg        30 mg Oral  Once 04/19/23 0641 04/19/23 0831   04/16/23 1300  oseltamivir (TAMIFLU) capsule 30 mg  Status:  Discontinued        30 mg Oral Daily 04/16/23 1252 04/18/23 1403         Component Value Date/Time   SDES BLOOD SITE NOT SPECIFIED 04/16/2023 0226   SPECREQUEST  04/16/2023 0226    BOTTLES DRAWN AEROBIC AND ANAEROBIC Blood Culture results may not be optimal due to an inadequate volume of blood received in culture bottles   CULT  04/16/2023 0226    NO GROWTH 5 DAYS Performed at [facility], [address] [place], [place] [address]    REPTSTATUS 04/21/2023 FINAL 04/16/2023 8469  Radiology Studies: No results found.  LOS: 8 days  Total time spent in review of labs and imaging, patient evaluation, formulation of plan, documentation and communication with family: 35 minutes  Lanae Boast, MD [facility]  04/23/2023, 1:33 PM

## 2023-04-23 NOTE — Plan of Care (Signed)
  Problem: Education: Goal: Ability to describe self-care measures that may prevent or decrease complications (Diabetes Survival Skills Education) will improve Outcome: Progressing Goal: Individualized Educational Video(s) Outcome: Progressing   Problem: Coping: Goal: Ability to adjust to condition or change in health will improve Outcome: Progressing   Problem: Fluid Volume: Goal: Ability to maintain a balanced intake and output will improve Outcome: Progressing   Problem: Metabolic: Goal: Ability to maintain appropriate glucose levels will improve Outcome: Progressing   Problem: Nutritional: Goal: Maintenance of adequate nutrition will improve Outcome: Progressing Goal: Progress toward achieving an optimal weight will improve Outcome: Progressing   Problem: Skin Integrity: Goal: Risk for impaired skin integrity will decrease Outcome: Progressing   Problem: Tissue Perfusion: Goal: Adequacy of tissue perfusion will improve Outcome: Progressing   Problem: Education: Goal: Knowledge of General Education information will improve Description: Including pain rating scale, medication(s)/side effects and non-pharmacologic comfort measures Outcome: Progressing   Problem: Health Behavior/Discharge Planning: Goal: Ability to manage health-related needs will improve Outcome: Progressing   Problem: Clinical Measurements: Goal: Ability to maintain clinical measurements within normal limits will improve Outcome: Progressing Goal: Will remain free from infection Outcome: Progressing Goal: Diagnostic test results will improve Outcome: Progressing Goal: Respiratory complications will improve Outcome: Progressing Goal: Cardiovascular complication will be avoided Outcome: Progressing   Problem: Activity: Goal: Risk for activity intolerance will decrease Outcome: Progressing   Problem: Nutrition: Goal: Adequate nutrition will be maintained Outcome: Progressing   Problem:  Coping: Goal: Level of anxiety will decrease Outcome: Progressing   Problem: Elimination: Goal: Will not experience complications related to bowel motility Outcome: Progressing Goal: Will not experience complications related to urinary retention Outcome: Progressing   Problem: Pain Management: Goal: General experience of comfort will improve Outcome: Progressing   Problem: Safety: Goal: Ability to remain free from injury will improve Outcome: Progressing   Problem: Skin Integrity: Goal: Risk for impaired skin integrity will decrease Outcome: Progressing

## 2023-04-23 NOTE — Progress Notes (Signed)
Received patient in bed to unit.  Alert and oriented.  Informed consent signed and in chart.   TX duration:3  Patient tolerated well.  Transported back to the room  Alert, without acute distress.  Hand-off given to patient's nurse Aldine Contes  Access used: right chest [facility] Access issues: none  Total UF removed: 2.1L Medication(s) given: N/A Post HD VS: Bp 160/88 P 86 Resp 14 O2 93% RA Post HD weight: 105kg  Freddie Breech, RN [facility]

## 2023-04-24 DIAGNOSIS — R0789 Other chest pain: Secondary | ICD-10-CM | POA: Diagnosis not present

## 2023-04-24 LAB — GLUCOSE, CAPILLARY
Glucose-Capillary: 174 mg/dL — ABNORMAL HIGH (ref 70–99)
Glucose-Capillary: 137 mg/dL — ABNORMAL HIGH (ref 70–99)
Glucose-Capillary: 147 mg/dL — ABNORMAL HIGH (ref 70–99)
Glucose-Capillary: 258 mg/dL — ABNORMAL HIGH (ref 70–99)
Glucose-Capillary: 269 mg/dL — ABNORMAL HIGH (ref 70–99)

## 2023-04-24 LAB — CBC
HCT: 23.3 % — ABNORMAL LOW (ref 36.0–46.0)
Hemoglobin: 8.2 g/dL — ABNORMAL LOW (ref 12.0–15.0)
MCH: 27.2 pg (ref 26.0–34.0)
MCHC: 35.2 g/dL (ref 30.0–36.0)
MCV: 77.2 fL — ABNORMAL LOW (ref 80.0–100.0)
Platelets: 273 10*3/uL (ref 150–400)
RBC: 3.02 MIL/uL — ABNORMAL LOW (ref 3.87–5.11)
RDW: 16.2 % — ABNORMAL HIGH (ref 11.5–15.5)
WBC: 17.8 10*3/uL — ABNORMAL HIGH (ref 4.0–10.5)
nRBC: 0.4 % — ABNORMAL HIGH (ref 0.0–0.2)

## 2023-04-24 LAB — CK: Total CK: 183 U/L (ref 38–234)

## 2023-04-24 LAB — HEPARIN LEVEL (UNFRACTIONATED)
Heparin Unfractionated: 0.18 [IU]/mL — ABNORMAL LOW (ref 0.30–0.70)
Heparin Unfractionated: 0.32 [IU]/mL (ref 0.30–0.70)

## 2023-04-24 MED ORDER — ENSURE ENLIVE PO LIQD
237.0000 mL | Freq: Two times a day (BID) | ORAL | Status: DC
  Administered 2023-04-24 – 2023-04-26 (×2): 237 mL via ORAL

## 2023-04-24 NOTE — Plan of Care (Signed)
  Problem: Coping: Goal: Ability to adjust to condition or change in health will improve Outcome: Progressing   Problem: Health Behavior/Discharge Planning: Goal: Ability to manage health-related needs will improve Outcome: Progressing   Problem: Clinical Measurements: Goal: Ability to maintain clinical measurements within normal limits will improve Outcome: Progressing

## 2023-04-24 NOTE — Progress Notes (Signed)
 PROGRESS NOTE Shirley [name]  FMW:[id number] DOB: 1975-08-03 DOA: 04/15/2023 PCP: Tobie Emmy POUR, NP  Brief Narrative/Hospital Course: Shirley [name] is a 47 y.o. female with a history of CKD stage IV, hypertension, HFmrEF, CAD s/p PCI, stroke, OSA, recurrent VTE.  Patient presented secondary to flu-like symptoms, pleuritic chest pain and chest heaviness found to have influenza A infection. Patient started on Tamiflu  for treatment. Hospitalization complicated by acute metabolic encephalopathy in addition to hypotension, requiring a one day admission to ICU. Although encephalopathy and hypotension improved, patient developed worsening AKI on underlying renal disease in addition to anuria/oliguria. 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    Subjective: Patient seen and examined this morning She feels better today.   Overnight BP stable afebrile Labs showing leukocytosis WBC up from 15.8-> 172 hemoglobin about the same 8.2 Wt on standing at 217lb from 231lb yesterday on admit at 227-232 lb Does have edema in her arms and legs  Assessment and Plan: Principal Problem:   Atypical chest pain Active Problems:   Essential hypertension   DM2 (diabetes mellitus, type 2) (HCC)   HLD (hyperlipidemia)   Chronic iron  deficiency anemia   GERD (gastroesophageal reflux disease)   Leukocytosis   Severe sepsis (HCC)   Acute renal failure superimposed on stage 4 chronic kidney disease (HCC)   Chronic systolic CHF (congestive heart failure) (HCC)   Influenza A   Elevated troponin   Influenza   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 S/P TDC by IR 12/26 HD day #1 12/26, day #2 12/28-  for HD #3 12/30-tolerating well. Still appears fluid overloaded,  continue HD per nephrology to address volume status.   She is Hep B+ will need isolation room for HD Recent Labs    04/15/23 2122 04/16/23 0800 04/16/23 1603 04/17/23 0804 04/18/23 1050 04/19/23 0655 04/20/23 0729 04/21/23 0541 04/22/23 0743 04/23/23 0714  BUN 42* 44* 46* 58* 75* 85* 67* 69* 50* 53*  CREATININE 4.51* 4.73* 5.26* 6.51* 7.79* 8.33* 6.60* 7.37* 5.82* 6.46*  CO2 17* 17* 14* 13* 16* 13* 17* 18* 22 22  K 4.5 4.6 4.2 4.6 4.5 4.6 4.1 4.1 3.9 4.2    Metabolic acidosis: Due to ESRD cont HD.  Resolved.  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 In 8-9gm.anemia panel showed- low iron  studies and received IV iron  per nephrology. Transfuse  if <7gm Recent Labs    08/12/22 0132 08/13/22 0617 04/18/23 1050 04/19/23 0655 04/20/23 0828 04/21/23 0541 04/22/23 0743 04/23/23 0714 04/24/23 0629  HGB 8.3*   < >  --    < > 9.0* 9.0* 8.6* 8.7* 8.2*  MCV 74.5*   < >  --    < > 76.3* 76.9* 76.4* 76.4* 77.2*  FERRITIN 28  --  374*  --   --   --   --   --   --   TIBC 252  --  251  --   --   --   --   --   --   IRON  22*  --  35  --   --   --   --   --   --    < > = values in this interval  not displayed.    Severe sepsis due to influenza Influenza A Infection: S/P Tramiflu 12/26.Blood culture no growth so far.  Doing well on room air with a stable respiratory status stable and less symptomatic now.  Acute metabolic encephalopathy Unclear etiology but presumed secondary to sepsis and complicated by underlying renal disease and uremia.   Mentation stable alert awake oriented x 3.  Continue PT OT> ?>SNF   Hypotension Concerned this was secondary to volume depletion and was given IV fluids while in ICU. BP stable now  Primary hypertension- PTA amlodipine  10 and Coreg  25mg  CAD Angina Stable no chest pain. PTA on Imdur , Lipitor  and aspirin .  Resumed Imdur , aspirin   Last cxr-clear. D-dimer elevated at 1.95. V/Q scan obtained and was  negative for acute PE. Continue home amlodipine , Coreg , hydralazine  tid -hopefully can wean down meds as volume status improves-amlodipine  decreased from to 5 mg 12/30-appreciate nephrology inputs   HFrEF LVEF of 45-50% from 02/2022.With evidence of lower leg edema but in the setting of fluid overload from worsening renal function.  Addressing fluid with HD with ultrafiltrate.  Check daily weight as below Weight on admission to  232> and went up to 230, standing reduced 217 pounds overall improved.   Chronic DVT Lower extremity venous duplex on 12/24 revealed a right popliteal vein chronic DVT.  Once no more procedures per nephrology will switch heparin  drip to her home Eliquis  renal dose   Diabetes mellitus type 2 Well-controlled A1c, continue SSI and monitor CBG as below Recent Labs  Lab 04/23/23 2043 04/23/23 2106 04/23/23 2339 04/24/23 0533 04/24/23 0816  GLUCAP 177* 162* 271* 174* 137*    Gastroparesis Stable.  No issues   History of CVA Continue home aspirin  and statin.    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  Obesity w/ OSA: Patient's Body mass index is 34.03 kg/m. : Will benefit with PCP follow-up, weight loss  healthy lifestyle and outpatient sleep evaluation.start  Cpap at [facility].  DVT prophylaxis:  Code Status:   Code Status: Full Code Family Communication: plan of care discussed with patient at bedside. Patient status is: Remains hospitalized because of severity of illness Level of care: Med-Surg   Dispo: The patient is from: home with boyfriend            Anticipated disposition: PT OT recommending skilled nursing facility.   Objective: Vitals last 24 hrs: Vitals:   04/23/23 2341 04/24/23 0413 04/24/23 0500 04/24/23 0921  BP: 120/72 (!) 142/75  (!) 143/82  Pulse: 60 91  92  Resp: 20 20  18   Temp: 97.9 F (36.6 C) 98 F (36.7 C)  98.1 F (36.7 C)  TempSrc: Axillary Oral  Oral  SpO2: 90% 100%  99%  Weight:   107.1 kg 98.6 kg  Height:        Weight change: -4.8 kg  Physical Examination: General exam: alert awake, oriented at baseline, older than stated age HEENT:Oral mucosa moist, Ear/Nose WNL grossly Respiratory system: Bilaterally clear BS,no use of accessory muscle Cardiovascular system: S1 & S2 +, No JVD. Gastrointestinal system: Abdomen soft,NT,ND, BS+ Nervous System: Alert, awake, moving all extremities,and following commands. Extremities:  UE./LE edema+,distal peripheral pulses palpable and warm.  Skin: No rashes,no icterus. MSK: Normal muscle bulk,tone, power  TDC+  Medications reviewed: Scheduled Meds:  amLODipine   5 mg Oral Daily   aspirin  EC  81 mg Oral Daily   atorvastatin   40 mg Oral Daily   calcium  acetate  667 mg Oral TID  WC   carvedilol   25 mg Oral BID WC   Chlorhexidine  Gluconate Cloth  6 each Topical Daily   Chlorhexidine  Gluconate Cloth  6 each Topical Q0600   Chlorhexidine  Gluconate Cloth  6 each Topical Q0600   darbepoetin (ARANESP ) injection - DIALYSIS  100 mcg Subcutaneous Q Mon-1800   hydrALAZINE   100 mg Oral Q8H   insulin  aspart  0-6 Units Subcutaneous Q4H   isosorbide  mononitrate  90 mg Oral Daily   pantoprazole  (PROTONIX ) IV  40 mg Intravenous Q24H   senna-docusate  1 tablet Oral Daily   sodium chloride  flush  3-10 mL Intravenous Q12H  Continuous Infusions:  heparin  1,050 Units/hr (04/24/23 0912)   heparin  sodium (porcine)      Diet Order             Diet renal with fluid restriction Fluid restriction: 1200 mL Fluid; Room service appropriate? Yes; Fluid consistency: Thin  Diet effective now                  Intake/Output Summary (Last 24 hours) at 04/24/2023 1107 Last data filed at 04/23/2023 2200 Gross per 24 hour  Intake 220 ml  Output 2100 ml  Net -1880 ml   Net IO Since Admission: 451.05 mL [04/24/23 1107]  Wt Readings from Last 3 Encounters:  04/24/23 98.6 kg  12/28/22 90.7 kg  09/28/22 90.7 kg     Unresulted Labs (From admission, onward)     Start      Ordered   04/24/23 1700  Heparin  level (unfractionated)  Once-Timed,   TIMED       Question:  Specimen collection method  Answer:  Lab=Lab collect   04/24/23 0850   04/22/23 0500  Heparin  level (unfractionated)  Daily,   R     Question:  Specimen collection method  Answer:  Lab=Lab collect   04/20/23 1913   04/20/23 0500  CBC  Daily,   R     Question:  Specimen collection method  Answer:  Lab=Lab collect   04/18/23 2253   04/18/23 1017  CK  Daily,   R     Question:  Specimen collection method  Answer:  Lab=Lab collect   04/18/23 1016   Unscheduled  Occult blood card to lab, stool  As needed,   R      04/18/23 1740   Signed and Held  Renal function panel  Once,   R       Question:  Specimen collection method  Answer:  Lab=Lab collect   Signed and Held   Signed and Held  CBC  Once,   R       Question:  Specimen collection method  Answer:  Lab=Lab collect   Signed and Held          Data Reviewed: I have personally reviewed following labs and imaging studies CBC: Recent Labs  Lab 04/20/23 0828 04/21/23 0541 04/22/23 0743 04/23/23 0714 04/24/23 0629  WBC 11.9* 10.6* 11.7* 15.8* 17.8*  HGB 9.0* 9.0* 8.6* 8.7* 8.2*  HCT 25.4* 25.3* 24.6* 24.6* 23.3*  MCV 76.3* 76.9* 76.4* 76.4* 77.2*  PLT 317 313 290 298 273   Basic Metabolic Panel:  Recent Labs  Lab 04/19/23 0655 04/20/23 0729 04/21/23 0541 04/22/23 0743 04/23/23 0714  NA 138 134* 135 135 136  K 4.6 4.1 4.1 3.9 4.2  CL 112* 104 104 102 105  CO2 13* 17* 18* 22 22  GLUCOSE 101* 145* 127* 135* 102*  BUN 85* 67* 69*  50* 53*  CREATININE 8.33* 6.60* 7.37* 5.82* 6.46*  CALCIUM  8.2* 7.5* 7.6* 7.8* 8.1*  PHOS 7.9* 6.7* 6.2* 4.5 5.1*   GFR: Estimated Creatinine Clearance: 12.7 mL/min (A) (by C-G formula based on SCr of 6.46 mg/dL (H)). Liver Function Tests:  Recent Labs  Lab 04/19/23 0655 04/20/23 0729 04/21/23 0541 04/22/23 0743 04/23/23 0714  ALBUMIN  2.3* 2.0* 2.1* 2.1* 2.2*   No results for input(s): LIPASE,  AMYLASE in the last 168 hours. No results for input(s): AMMONIA in the last 168 hours. No results for input(s): PROCALCITON, LATICACIDVEN in the last 168 hours.  COVID negative on 04/15/2023 9:23 PM RSV virus negative Blood culture no growth from 04/16/2023 at 2:16 AM MRSA PCR negative on 12/23 5:07 PM.  Antimicrobials/Microbiology: Anti-infectives (From admission, onward)    Start     Dose/Rate Route Frequency Ordered Stop   04/19/23 1550  ceFAZolin  (ANCEF ) IVPB 2g/100 mL premix        over 30 Minutes Intravenous Continuous PRN 04/19/23 1550 04/19/23 1613   04/19/23 1115  ceFAZolin  (ANCEF ) IVPB 2g/100 mL premix       Note to Pharmacy: TO BE GIVEN IR   2 g 200 mL/hr over 30 Minutes Intravenous To Radiology 04/19/23 1022 04/19/23 1643   04/19/23 1000  oseltamivir  (TAMIFLU ) capsule 30 mg        30 mg Oral  Once 04/19/23 0641 04/19/23 0831   04/16/23 1300  oseltamivir  (TAMIFLU ) capsule 30 mg  Status:  Discontinued        30 mg Oral Daily 04/16/23 1252 04/18/23 1403         Component Value Date/Time   SDES BLOOD SITE NOT SPECIFIED 04/16/2023 0226   SPECREQUEST  04/16/2023 0226    BOTTLES DRAWN AEROBIC AND ANAEROBIC Blood Culture results may not be optimal due to an inadequate volume of blood received in culture bottles   CULT  04/16/2023 0226    NO GROWTH 5 DAYS Performed at [facility], [address] [place], [place] [address]    REPTSTATUS 04/21/2023 FINAL 04/16/2023 9773  Radiology Studies: No results found.  LOS: 9 days  Total time spent in review of labs and imaging, patient evaluation, formulation of plan, documentation and communication with family: 35 minutes  Mennie LAMY, MD [facility]  04/24/2023, 11:07 AM

## 2023-04-24 NOTE — Progress Notes (Signed)
 Initial Nutrition Assessment  DOCUMENTATION CODES:   Obesity unspecified  INTERVENTION:  Ensure Plus High Protein po BID, each supplement provides 350 kcal and 20 grams of protein. Education provided verbal and handouts.   NUTRITION DIAGNOSIS:   Increased nutrient needs related to chronic illness as evidenced by estimated needs.     GOAL:   Patient will meet greater than or equal to 90% of their needs     MONITOR:   PO intake, Supplement acceptance  REASON FOR ASSESSMENT:   Consult Diet education  ASSESSMENT:  47 y.o. F, Presented from home to [facility] ED with complaints of shortness of breath, admitting on 04/15/23, with atypical chest pain. PMH: thrombocytosis, iron  deficiency, DMII, CAD, CVA, migraine, GERD, Fibromyalgia, Substance abuse, Hep-B, OSA, gastroparesis, MI Friends/Family were in room at time of visit. Pt was not interested in education at this time and asked me to speak with friend about diet education.  All information provided to friend and hand outs left on bedside table. Pt meal intake poor.   Admit weight: 89.4 kg Weight history:  04/24/23 107.1 kg  12/28/22 90.7 kg  09/28/22 90.7 kg  09/15/22 89.4 kg  08/28/22 103.9 kg  08/15/22 102 kg  05/31/22 94.8 kg  05/23/22 99 kg   Hospital weight history:  04/24/23 0500 107.1 kg 236.11 lbs  04/23/23 1208 105 kg 231.48 lbs  04/23/23 0856 101.1 kg 222.89 lbs  04/23/23 0500 105.9 kg 233.47 lbs  04/21/23 1024 105.9 kg 233.47 lbs  04/21/23 0727 106.9 kg 235.67 lbs  04/21/23 0612 106.9 kg 235.67 lbs  04/20/23 0449 107.8 kg 237.66 lbs  04/19/23 0646 105.4 kg 232.37 lbs  04/18/23 0500 105.3 kg 232.14 lbs  04/17/23 0500 103.4 kg 227.96 lbs  04/16/23 1701 103.4 kg 227.96 lbs  04/15/23 2300 89.4 kg 197.09 lb    Average Meal Intake: No current documentation.   Nutritionally Relevant Medications: Scheduled Meds:  amLODipine   5 mg Oral Daily   calcium  acetate  667 mg Oral TID WC   carvedilol   25 mg Oral  BID WC   darbepoetin (ARANESP ) injection - DIALYSIS  100 mcg Subcutaneous Q Mon-1800   hydrALAZINE   100 mg Oral Q8H   senna-docusate  1 tablet Oral Daily    Labs Reviewed:  HgbA1c 6.5    NUTRITION - FOCUSED PHYSICAL EXAM:  Flowsheet Row Most Recent Value  Orbital Region No depletion  Upper Arm Region No depletion  Thoracic and Lumbar Region No depletion  Buccal Region No depletion  Temple Region No depletion  Clavicle Bone Region No depletion  Clavicle and Acromion Bone Region No depletion  Scapular Bone Region No depletion  Dorsal Hand No depletion  Patellar Region No depletion  Anterior Thigh Region No depletion  Posterior Calf Region No depletion  Edema (RD Assessment) Mild  Hair Reviewed  Eyes Reviewed  Mouth Reviewed  Skin Reviewed  Nails Reviewed       Diet Order:   Diet Order             Diet renal with fluid restriction Fluid restriction: 1200 mL Fluid; Room service appropriate? Yes; Fluid consistency: Thin  Diet effective now                   EDUCATION NEEDS:   Education needs have been addressed  Skin:  Skin Assessment: Reviewed RN Assessment  Last BM:  12/27  Height:   Ht Readings from Last 1 Encounters:  04/15/23 5' 7 (1.702 m)  Weight:   Wt Readings from Last 1 Encounters:  04/24/23 98.6 kg    Ideal Body Weight:     BMI:  Body mass index is 34.03 kg/m.  Estimated Nutritional Needs:   Kcal:  1850-2150 kcal  Protein:  80-95 g/d  Fluid:  7ml/kcal    Shirley [name] RDN, LDN Clinical Dietitian   If unable to reach, please contact RD Inpatient secure chat group between 8 am-4 pm daily

## 2023-04-24 NOTE — Progress Notes (Signed)
 Physical Therapy Treatment Patient Details Name: Shirley [name] MRN: [id number] DOB: 04-06-1976 Today's Date: 04/24/2023   History of Present Illness 47 y.o. female presented 12/22 secondary to flu-like symptoms, pleuritic chest pain and chest heaviness found to have influenza A infection. Hospitalization complicated by acute metabolic encephalopathy in addition to hypotension. 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 with a history of CKD stage IV, hypertension, HFmrEF, CAD s/p PCI, stroke, OSA, recurrent VTE.    PT Comments  Pt with some improvement in activity tolerance this session however remains grossly limited compared to baseline. Pt continues to remain cognitively altered, with poor recall of patient commands and often moaning throughout session. Pt requires verbal cueing to improve transfer efficiency, as well as verbal cues for safety when ambulating. Pt only tolerates very short bouts of ambulation at this time. PT provides incentive spirometer to the patient and educates on the importance of utilizing to improve pulmonary function. Pt remains adamant about a return home. To do so she would need support for all out of bed activity and most ADLs from family or caregivers. Pt reports this assistance is available from her daughter and significant other. If pt does not have consistent caregiver support then discharge home would likely not be safe. Pt will need to mobilize frequently at the time of discharge to improve strength and pulmonary function. Short term inpatient PT services may be a safer discharge plan for this patient if caregiver support is not available or committed to assisting the patient in frequent mobility.   If plan is discharge home, recommend the following: A Leidner help with walking and/or transfers;A lot of help with bathing/dressing/bathroom;Assistance with cooking/housework;Direct supervision/assist for medications  management;Direct supervision/assist for financial management;Assist for transportation;Supervision due to cognitive status   Can travel by private vehicle     Yes  Equipment Recommendations  Rolling walker (2 wheels);BSC/3in1    Recommendations for Other Services       Precautions / Restrictions Precautions Precautions: Fall Restrictions Weight Bearing Restrictions Per Provider Order: No     Mobility  Bed Mobility               General bed mobility comments: pt received and left sitting, bed mobility not assessed    Transfers Overall transfer level: Needs assistance Equipment used: Rolling walker (2 wheels) Transfers: Sit to/from Stand Sit to Stand: Contact guard assist           General transfer comment: verbal cues for hand placement and increased trunk flexion    Ambulation/Gait Ambulation/Gait assistance: Min assist Gait Distance (Feet): 15 Feet Assistive device: Rolling walker (2 wheels) Gait Pattern/deviations: Step-to pattern, Trunk flexed Gait velocity: reduced Gait velocity interpretation: <1.31 ft/sec, indicative of household ambulator   General Gait Details: pt with slowed step-to gait, increased trunk flexion over RW, PT provides verbal cues when approaching chair to slow gait speed as pt appears to attempt to turn quickly to reach seat   Stairs             Wheelchair Mobility     Tilt Bed    Modified Rankin (Stroke Patients Only)       Balance Overall balance assessment: Needs assistance Sitting-balance support: No upper extremity supported, Feet supported Sitting balance-Leahy Scale: Good     Standing balance support: Bilateral upper extremity supported, Reliant on assistive device for balance Standing balance-Leahy Scale: Poor  Cognition Arousal: Alert Behavior During Therapy: Flat affect Overall Cognitive Status: Impaired/Different from baseline Area of Impairment: Attention,  Memory, Safety/judgement, Awareness, Problem solving                   Current Attention Level: Focused Memory: Decreased recall of precautions, Decreased short-term memory   Safety/Judgement: Decreased awareness of safety, Decreased awareness of deficits Awareness: Emergent Problem Solving: Slow processing General Comments: pt is unable to recall PT commands ~10-15 seconds after PT provides instruction        Exercises      General Comments General comments (skin integrity, edema, etc.): VSS on RA, pt continues to sound congested. PT provides incentive spirometer, no flutter valve devices present on the unit at this time. PT provides instruction on incentive spirometer use      Pertinent Vitals/Pain Pain Assessment Pain Assessment: Faces Faces Pain Scale: Hurts Nading more Pain Location: generalized Pain Descriptors / Indicators: Moaning Pain Intervention(s): Monitored during session    Home Living                          Prior Function            PT Goals (current goals can now be found in the care plan section) Acute Rehab PT Goals Patient Stated Goal: to go home Progress towards PT goals: Progressing toward goals    Frequency    Min 1X/week      PT Plan      Co-evaluation              AM-PAC PT 6 Clicks Mobility   Outcome Measure  Help needed turning from your back to your side while in a flat bed without using bedrails?: A Reller Help needed moving from lying on your back to sitting on the side of a flat bed without using bedrails?: A Niemann Help needed moving to and from a bed to a chair (including a wheelchair)?: A Raus Help needed standing up from a chair using your arms (e.g., wheelchair or bedside chair)?: A Mirante Help needed to walk in hospital room?: A Lot Help needed climbing 3-5 steps with a railing? : Total 6 Click Score: 15    End of Session Equipment Utilized During Treatment: Gait belt Activity Tolerance:  Patient limited by fatigue Patient left: in bed;with call bell/phone within reach;with bed alarm set Nurse Communication: Mobility status PT Visit Diagnosis: Unsteadiness on feet (R26.81);Muscle weakness (generalized) (M62.81);Difficulty in walking, not elsewhere classified (R26.2);Other symptoms and signs involving the nervous system (R29.898)     Time: 9071-9046 PT Time Calculation (min) (ACUTE ONLY): 25 min  Charges:    $Gait Training: 8-22 mins $Therapeutic Activity: 8-22 mins PT General Charges $$ ACUTE PT VISIT: 1 Visit                     Bernardino JINNY Ruth, PT, DPT Acute Rehabilitation Office [phone]    Bernardino JINNY Ruth 04/24/2023, 11:04 AM

## 2023-04-24 NOTE — Progress Notes (Signed)
 PHARMACY - ANTICOAGULATION CONSULT NOTE  Pharmacy Consult for heparin  Indication: chronic DVT R politeal vein  Allergies  Allergen Reactions   Morphine  And Codeine  Itching and Swelling   Zestril [Lisinopril] Nausea Only and Swelling    Patient Measurements: Height: 5' 7 (170.2 cm) Weight: 98.6 kg (217 lb 4.8 oz) IBW/kg (Calculated) : 61.6 Heparin  Dosing Weight: 80.7kg  Vital Signs: Temp: 98.9 F (37.2 C) (12/31 1824) Temp Source: Oral (12/31 1824) BP: 139/66 (12/31 1824) Pulse Rate: 93 (12/31 1824)  Labs: Recent Labs    04/22/23 0743 04/23/23 0714 04/24/23 0629 04/24/23 1810  HGB 8.6* 8.7* 8.2*  --   HCT 24.6* 24.6* 23.3*  --   PLT 290 298 273  --   HEPARINUNFRC 0.40 0.24* 0.18* 0.32  CREATININE 5.82* 6.46*  --   --   CKTOTAL 531* 336* 183  --     Estimated Creatinine Clearance: 12.7 mL/min (A) (by C-G formula based on SCr of 6.46 mg/dL (H)).  Assessment: 29 yoF presented to ED with chest pain and shortness of breath. PMH includes: HFrEF (45-50%), CKD Stage 4, CAD s/p PCI, stroke, T2DM, h/o DVT/PE (h/o eliquis  >> patient states no longer taking). Pharmacy consulted to dose heparin  for history of DVT/PE. Heparin  was then changed to SQ heparin  12/24 after VQ scan negative for PE. 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 Pharmacy consulted to restart IV heparin .    Heparin  level this evening now within goal range at 0.32.  No overt bleeding or complications noted.  Goal of Therapy:  Heparin  level 0.3-0.7 units/ml Monitor platelets by anticoagulation protocol: Yes   Plan:  Continue IV heparin  infusion at 1050 units/hour  Daily heparin  level and CBC F/U transition to oral AC   Harlene Barlow, Berdine BIRCH, BCPS, BCCP Clinical Pharmacist  04/24/2023 7:27 PM   [facility] pharmacy phone numbers are listed on [email or web address]

## 2023-04-24 NOTE — Progress Notes (Signed)
 PHARMACY - ANTICOAGULATION CONSULT NOTE  Pharmacy Consult for heparin  Indication: chronic DVT R politeal vein  Allergies  Allergen Reactions   Morphine  And Codeine  Itching and Swelling   Zestril [Lisinopril] Nausea Only and Swelling    Patient Measurements: Height: 5' 7 (170.2 cm) Weight: 107.1 kg (236 lb 1.8 oz) IBW/kg (Calculated) : 61.6 Heparin  Dosing Weight: 80.7kg  Vital Signs: Temp: 98 F (36.7 C) (12/31 0413) Temp Source: Oral (12/31 0413) BP: 142/75 (12/31 0413) Pulse Rate: 91 (12/31 0413)  Labs: Recent Labs    04/22/23 0743 04/23/23 0714 04/24/23 0629  HGB 8.6* 8.7* 8.2*  HCT 24.6* 24.6* 23.3*  PLT 290 298 273  HEPARINUNFRC 0.40 0.24* 0.18*  CREATININE 5.82* 6.46*  --   CKTOTAL 531* 336* 183    Estimated Creatinine Clearance: 13.3 mL/min (A) (by C-G formula based on SCr of 6.46 mg/dL (H)).  Assessment: 31 yoF presented to ED with chest pain and shortness of breath. PMH includes: HFrEF (45-50%), CKD Stage 4, CAD s/p PCI, stroke, T2DM, h/o DVT/PE (h/o eliquis  >> patient states no longer taking). Pharmacy consulted to dose heparin  for history of DVT/PE. Heparin  was then changed to SQ heparin  12/24 after VQ scan negative for PE. 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 Pharmacy consulted to restart IV heparin .    Heparin  level came back subtherapeutic this AM. Pt is s/p TDC. We will increase heparin  for now pending plan for any further procedures and resume apixaban .  Goal of Therapy:  Heparin  level 0.3-0.7 units/ml Monitor platelets by anticoagulation protocol: Yes   Plan:  Increase heparin  infusion 1050 units/hour  Check 8 hr HL Daily heparin  level and CBC F/U transition to oral [facility]   Sergio Batch, PharmD, BCIDP, AAHIVP, CPP Infectious Disease Pharmacist 04/24/2023 8:53 AM

## 2023-04-24 NOTE — Progress Notes (Addendum)
 Transition of Care [facility]) - Inpatient Brief Assessment   Patient Details  Name: Shirley [name] MRN: [id number] Date of Birth: Dec 24, 1975  [facility] CM/SW Contact:    Rosaline JONELLE Joe, RN Phone Number: 04/24/2023, 12:20 PM   Clinical Narrative: CM met with the patient at the bedside while patient's daughter was present on the phone to discuss TOC needs for the patient.  The patient refuses SNF placement and requests to go home to her daughter's apartment - when medically stable at listed address in EPIC.    [name] daughter states that she can provide 24 hour assistance in the home.  The patient has no DME at the home and will need RW and 3:1 ordered to be delivered to the hospital room for patient to take home.  Medicare choice was offered regarding home health services and the patient/daughter did not have a preference.  HH orders for PT will be ordered and DME orders for 3:1 and RW will be placed.  Patient's first OUtpatient HD session will be Friday since Wednesday is a holiday per renal navigator - see renal navigator note for details.  Patient is HD patient and renal navigator and MSW, Jordan is aware that patient has declined SNF placement.     Transition of Care Asessment: Insurance and Status: (P) Insurance coverage has been reviewed Patient has primary care physician: (P) Yes Home environment has been reviewed: (P) from home with boyfriend Prior level of function:: (P) Independent Prior/Current Home Services: (P) No current home services Social Drivers of Health Review: (P) SDOH reviewed interventions complete Readmission risk has been reviewed: (P) Yes Transition of care needs: (P) transition of care needs identified, TOC will continue to follow

## 2023-04-24 NOTE — Progress Notes (Addendum)
 Attempted to meet with pt at bedside to discuss out-pt HD arrangements but pt was receiving pt care. Will have to f/u at a later time. Pt's daughter made aware of arrangements yesterday. Will assist as needed.   Randine Mungo Renal Navigator [phone]  Addendum at 2:41 pm: Advised by RN CM of plan for pt to return home with daughter at d/c. Daughter advised RN CM that she can provide transportation to HD appts at d/c. Daughter provided HD appt info yesterday and placed on pt's AVS as well. Contacted renal PA today regarding clinic's need for orders at d/c.

## 2023-04-24 NOTE — Progress Notes (Signed)
    Durable Medical Equipment  (From admission, onward)           Start     Ordered   04/24/23 1226  For home use only DME Walker rolling  Once       Question Answer Comment  Walker: With 5 Inch Wheels   Patient needs a walker to treat with the following condition Generalized weakness      04/24/23 1226   04/24/23 1226  For home use only DME Bedside commode  Once       Question:  Patient needs a bedside commode to treat with the following condition  Answer:  Generalized weakness   04/24/23 1226

## 2023-04-24 NOTE — Progress Notes (Addendum)
 Shirley [name] Progress Note    Assessment/ Plan:   CKD5, progressed to ESRD -Dr. Jerrye discussed with patient's daughter on 12/25 in regards to starting HD and they did consent - HD#1 12/27 early AM , s/p TDC with IR  - renal navigator involved  -tentatively planning on HD#2 12/28 then #3 Monday 12/30, continuing MWF schedule -  Hep B + - CLIP'd  Severe sepsis Influenza A -per primary service-  slow to improve  HTN -UF as tolerated with HD. BP acceptable norvasc  10, coreg  25 BID, hydral 100 tid and PRNs-  hopefully will be able to wean BP meds when volume status is better-  will try to dec norvasc  to 5  Metabolic acidosis - improving  HFrEF -UF as tolerated with HD  DM2 -Per primary  Anemia of CKD -transfuse prn for hgb <7 - Fe panel low, treat with IV iron  first-  hgb 9-10 -  dropped below 9 today -  will add ESA   Chronic DVT -on heparin  gtt -per primary, can resume DOAC, AVF as OP  CKD MBD -renal diet-  added phoslo  for binding  -checking PTH -  is 273- no meds yet   Hep B + - needs iso room  Subjective:    Seen in room.  CLIP'd.  More awake than yesterday.     Objective:   BP (!) 143/82 (BP Location: Right Arm)   Pulse 92   Temp 98.1 F (36.7 C) (Oral)   Resp 18   Ht 5' 7 (1.702 m)   Wt 98.6 kg   LMP 01/22/2021 Comment: neg preg test 04/17/21  SpO2 99%   BMI 34.03 kg/m   Intake/Output Summary (Last 24 hours) at 04/24/2023 1520 Last data filed at 04/24/2023 1200 Gross per 24 hour  Intake 520 ml  Output --  Net 520 ml   Weight change: -4.8 kg  Physical Exam: Gen: ill appearing, NAD, drowsy CVS: RRR Resp: decreased breath sounds bibasilar, normal wob Abd: soft Ext: 1+ upper and lower extremities Neuro: drowsy, intermittently answering questions TDC in place   Imaging: No results found.   Labs: BMET Recent Labs  Lab 04/18/23 1050 04/19/23 0655 04/20/23 0729 04/21/23 0541 04/22/23 0743 04/23/23 0714  NA 137  138 134* 135 135 136  K 4.5 4.6 4.1 4.1 3.9 4.2  CL 112* 112* 104 104 102 105  CO2 16* 13* 17* 18* 22 22  GLUCOSE 118* 101* 145* 127* 135* 102*  BUN 75* 85* 67* 69* 50* 53*  CREATININE 7.79* 8.33* 6.60* 7.37* 5.82* 6.46*  CALCIUM  8.2* 8.2* 7.5* 7.6* 7.8* 8.1*  PHOS 7.5* 7.9* 6.7* 6.2* 4.5 5.1*   CBC Recent Labs  Lab 04/21/23 0541 04/22/23 0743 04/23/23 0714 04/24/23 0629  WBC 10.6* 11.7* 15.8* 17.8*  HGB 9.0* 8.6* 8.7* 8.2*  HCT 25.3* 24.6* 24.6* 23.3*  MCV 76.9* 76.4* 76.4* 77.2*  PLT 313 290 298 273    Medications:     amLODipine   5 mg Oral Daily   aspirin  EC  81 mg Oral Daily   atorvastatin   40 mg Oral Daily   calcium  acetate  667 mg Oral TID WC   carvedilol   25 mg Oral BID WC   Chlorhexidine  Gluconate Cloth  6 each Topical Daily   Chlorhexidine  Gluconate Cloth  6 each Topical Q0600   Chlorhexidine  Gluconate Cloth  6 each Topical Q0600   darbepoetin (ARANESP ) injection - DIALYSIS  100 mcg Subcutaneous Q Mon-1800   feeding supplement  237  mL Oral BID BM   hydrALAZINE   100 mg Oral Q8H   insulin  aspart  0-6 Units Subcutaneous Q4H   isosorbide  mononitrate  90 mg Oral Daily   pantoprazole  (PROTONIX ) IV  40 mg Intravenous Q24H   senna-docusate  1 tablet Oral Daily   sodium chloride  flush  3-10 mL Intravenous Q12H      Shirley [name]  [name] 04/24/2023, 3:20 PM

## 2023-04-25 DIAGNOSIS — R0789 Other chest pain: Secondary | ICD-10-CM | POA: Diagnosis not present

## 2023-04-25 LAB — GLUCOSE, CAPILLARY
Glucose-Capillary: 105 mg/dL — ABNORMAL HIGH (ref 70–99)
Glucose-Capillary: 159 mg/dL — ABNORMAL HIGH (ref 70–99)
Glucose-Capillary: 167 mg/dL — ABNORMAL HIGH (ref 70–99)
Glucose-Capillary: 169 mg/dL — ABNORMAL HIGH (ref 70–99)
Glucose-Capillary: 208 mg/dL — ABNORMAL HIGH (ref 70–99)
Glucose-Capillary: 288 mg/dL — ABNORMAL HIGH (ref 70–99)

## 2023-04-25 LAB — CBC
HCT: 25.7 % — ABNORMAL LOW (ref 36.0–46.0)
Hemoglobin: 8.9 g/dL — ABNORMAL LOW (ref 12.0–15.0)
MCH: 26.8 pg (ref 26.0–34.0)
MCHC: 34.6 g/dL (ref 30.0–36.0)
MCV: 77.4 fL — ABNORMAL LOW (ref 80.0–100.0)
Platelets: 297 10*3/uL (ref 150–400)
RBC: 3.32 MIL/uL — ABNORMAL LOW (ref 3.87–5.11)
RDW: 16.6 % — ABNORMAL HIGH (ref 11.5–15.5)
WBC: 15.8 10*3/uL — ABNORMAL HIGH (ref 4.0–10.5)
nRBC: 0.4 % — ABNORMAL HIGH (ref 0.0–0.2)

## 2023-04-25 LAB — CK: Total CK: 102 U/L (ref 38–234)

## 2023-04-25 MED ORDER — PANTOPRAZOLE SODIUM 40 MG PO TBEC
40.0000 mg | DELAYED_RELEASE_TABLET | Freq: Every day | ORAL | Status: DC
  Administered 2023-04-25 – 2023-04-26 (×2): 40 mg via ORAL
  Filled 2023-04-25 (×2): qty 1

## 2023-04-25 MED ORDER — APIXABAN 5 MG PO TABS
5.0000 mg | ORAL_TABLET | Freq: Two times a day (BID) | ORAL | Status: DC
  Administered 2023-04-25 – 2023-04-26 (×3): 5 mg via ORAL
  Filled 2023-04-25 (×3): qty 1

## 2023-04-25 MED ORDER — POLYETHYLENE GLYCOL 3350 17 G PO PACK: 17.0000 g | PACK | Freq: Every day | ORAL | Status: DC | PRN

## 2023-04-25 MED ORDER — BISACODYL 10 MG RE SUPP: 10.0000 mg | Freq: Every day | RECTAL | Status: DC | PRN

## 2023-04-25 MED ORDER — SENNOSIDES-DOCUSATE SODIUM 8.6-50 MG PO TABS
1.0000 | ORAL_TABLET | Freq: Two times a day (BID) | ORAL | Status: DC
  Administered 2023-04-25 – 2023-04-26 (×2): 1 via ORAL
  Filled 2023-04-25 (×2): qty 1

## 2023-04-25 MED ORDER — APIXABAN 5 MG PO TABS: 5.0000 mg | ORAL_TABLET | Freq: Two times a day (BID) | ORAL | Status: DC

## 2023-04-25 NOTE — Plan of Care (Signed)
  Problem: Coping: Goal: Ability to adjust to condition or change in health will improve Outcome: Progressing   Problem: Fluid Volume: Goal: Ability to maintain a balanced intake and output will improve Outcome: Progressing   Problem: Metabolic: Goal: Ability to maintain appropriate glucose levels will improve Outcome: Progressing   Problem: Pain Management: Goal: General experience of comfort will improve Outcome: Progressing

## 2023-04-25 NOTE — Progress Notes (Addendum)
 PHARMACY - ANTICOAGULATION CONSULT NOTE  Pharmacy Consult for heparin  >> Eliquis  Indication: chronic DVT R politeal vein  Allergies  Allergen Reactions   Morphine  And Codeine  Itching and Swelling   Zestril [Lisinopril] Nausea Only and Swelling    Patient Measurements: Height: 5' 7 (170.2 cm) Weight: 98.4 kg (216 lb 14.9 oz) IBW/kg (Calculated) : 61.6 Heparin  Dosing Weight: 80.7kg  Vital Signs: Temp: 98.1 F (36.7 C) (01/01 0418) Temp Source: Oral (01/01 0418) BP: 151/76 (01/01 0418) Pulse Rate: 96 (01/01 0418)  Labs: Recent Labs    04/22/23 0743 04/23/23 0714 04/24/23 0629 04/24/23 1810  HGB 8.6* 8.7* 8.2*  --   HCT 24.6* 24.6* 23.3*  --   PLT 290 298 273  --   HEPARINUNFRC 0.40 0.24* 0.18* 0.32  CREATININE 5.82* 6.46*  --   --   CKTOTAL 531* 336* 183  --     Estimated Creatinine Clearance: 12.7 mL/min (A) (by C-G formula based on SCr of 6.46 mg/dL (H)).  Assessment: 54 yoF presented to ED with chest pain and shortness of breath. PMH includes: HFrEF (45-50%), CKD Stage 4, CAD s/p PCI, stroke, T2DM, h/o DVT/PE (h/o eliquis  >> patient states no longer taking). Pharmacy consulted to dose heparin  for history of DVT/PE. Heparin  was then changed to SQ heparin  12/24 after VQ scan negative for PE. 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 Pharmacy consulted to transition from heparin  to Eliquis .     Patient is s/p [facility] and noted plans for AVT as OP per nephrology. She was previously on Eliquis  5 mg PO BID for recurrent DVT/PE but is no longer taking it. Given unknown age of DVT found on Dopplers, will resume Eliquis  at 5 mg PO BID and not do 7 day lead-in.   Goal of Therapy:  Monitor platelets by anticoagulation protocol: Yes   Plan:  STOP heparin  infusion START Eliquis  5 mg PO BID at time of heparin  infusion discontinuation Monitor CBC and s/sx of bleeding  Josefa Range, PharmD PGY1 Pharmacy Resident 04/25/2023 7:49 AM

## 2023-04-25 NOTE — Plan of Care (Signed)
  Problem: Education: Goal: Ability to describe self-care measures that may prevent or decrease complications (Diabetes Survival Skills Education) will improve Outcome: Progressing Goal: Individualized Educational Video(s) Outcome: Progressing   Problem: Coping: Goal: Ability to adjust to condition or change in health will improve Outcome: Progressing   Problem: Fluid Volume: Goal: Ability to maintain a balanced intake and output will improve Outcome: Progressing   Problem: Health Behavior/Discharge Planning: Goal: Ability to identify and utilize available resources and services will improve Outcome: Progressing Goal: Ability to manage health-related needs will improve Outcome: Progressing   Problem: Metabolic: Goal: Ability to maintain appropriate glucose levels will improve Outcome: Progressing   Problem: Nutritional: Goal: Maintenance of adequate nutrition will improve Outcome: Progressing Goal: Progress toward achieving an optimal weight will improve Outcome: Progressing   Problem: Skin Integrity: Goal: Risk for impaired skin integrity will decrease Outcome: Progressing   Problem: Tissue Perfusion: Goal: Adequacy of tissue perfusion will improve Outcome: Progressing   Problem: Education: Goal: Knowledge of General Education information will improve Description: Including pain rating scale, medication(s)/side effects and non-pharmacologic comfort measures Outcome: Progressing   Problem: Health Behavior/Discharge Planning: Goal: Ability to manage health-related needs will improve Outcome: Progressing   Problem: Clinical Measurements: Goal: Ability to maintain clinical measurements within normal limits will improve Outcome: Progressing Goal: Will remain free from infection Outcome: Progressing Goal: Diagnostic test results will improve Outcome: Progressing Goal: Respiratory complications will improve Outcome: Progressing Goal: Cardiovascular complication will  be avoided Outcome: Progressing   Problem: Activity: Goal: Risk for activity intolerance will decrease Outcome: Progressing   Problem: Nutrition: Goal: Adequate nutrition will be maintained Outcome: Progressing   Problem: Coping: Goal: Level of anxiety will decrease Outcome: Progressing   Problem: Elimination: Goal: Will not experience complications related to bowel motility Outcome: Progressing Goal: Will not experience complications related to urinary retention Outcome: Progressing   Problem: Pain Management: Goal: General experience of comfort will improve Outcome: Progressing   Problem: Safety: Goal: Ability to remain free from injury will improve Outcome: Progressing   Problem: Skin Integrity: Goal: Risk for impaired skin integrity will decrease Outcome: Progressing   Problem: Education: Goal: Knowledge of disease and its progression will improve Outcome: Progressing   Problem: Fluid Volume: Goal: Compliance with measures to maintain balanced fluid volume will improve Outcome: Progressing   Problem: Health Behavior/Discharge Planning: Goal: Ability to manage health-related needs will improve Outcome: Progressing   Problem: Nutritional: Goal: Ability to make healthy dietary choices will improve Outcome: Progressing   Problem: Clinical Measurements: Goal: Complications related to the disease process, condition or treatment will be avoided or minimized Outcome: Progressing

## 2023-04-25 NOTE — Progress Notes (Signed)
 Received patient in bed to unit.  Alert and oriented.  Informed consent signed and in chart.   TX duration: 3 hours  Patient tolerated well.  Transported back to the room  Alert, without acute distress.  Hand-off given to patient's nurse Mardy Manual  Access used: right chest [facility] Access issues: none  Total UF removed: 3L Medication(s) given: none Post HD VS: Temp 97.3 oral BP 148/61 P 90 resp 17 O2 Sat 96% RA Post HD weight: 96.1kg  [place], RN [facility]

## 2023-04-25 NOTE — Procedures (Signed)
 Patient seen and examined on Hemodialysis. The procedure was supervised and I have made appropriate changes. BP 138/78   Pulse 90   Temp 98.3 F (36.8 C)   Resp 15   Ht 5' 7 (1.702 m)   Wt 98.4 kg   LMP 01/22/2021 Comment: neg preg test 04/17/21  SpO2 94%   BMI 33.98 kg/m   QB 400 mL/ min via TDC, UF goal 2-->3L d/t swelling and BP  Tolerating treatment without complaints at this time.   Almarie Bonine MD [facility] [phone] 11:37 AM

## 2023-04-25 NOTE — Progress Notes (Signed)
 [facility] Progress Note    Assessment/ Plan:   CKD5, progressed to ESRD -Dr. Jerrye discussed with patient's daughter on 12/25 in regards to starting HD and they did consent - HD#1 12/27 early AM , s/p TDC with IR  - renal navigator involved  -tentatively planning on HD#2 12/28 then #3 Monday 12/30, continuing MWF schedule -  Hep B + - CLIP'd  Severe sepsis Influenza A -per primary service-  slow to improve  HTN -UF as tolerated with HD. BP acceptable norvasc  10, coreg  25 BID, hydral 100 tid and PRNs-  hopefully will be able to wean BP meds when volume status is better-  will try to dec norvasc  to 5  Metabolic acidosis - improving  HFrEF -UF as tolerated with HD  DM2 -Per primary  Anemia of CKD -transfuse prn for hgb <7 - Fe panel low, treat with IV iron  first-  hgb 9-10 -  dropped below 9 today -  will add ESA   Chronic DVT -on heparin  gtt -per primary, can resume DOAC, AVF as OP  CKD MBD -renal diet-  added phoslo  for binding  -checking PTH -  is 273- no meds yet   Hep B + - needs iso room  Subjective:    Seen on dialysis.  Still with a pretty good bit of swelling.  Cold, asking for blanket- applied.     Objective:   BP 138/78   Pulse 90   Temp 98.3 F (36.8 C)   Resp 15   Ht 5' 7 (1.702 m)   Wt 98.4 kg   LMP 01/22/2021 Comment: neg preg test 04/17/21  SpO2 94%   BMI 33.98 kg/m   Intake/Output Summary (Last 24 hours) at 04/25/2023 1135 Last data filed at 04/25/2023 0600 Gross per 24 hour  Intake 660 ml  Output --  Net 660 ml   Weight change: -2.533 kg  Physical Exam: Gen: ill appearing, NAD, drowsy CVS: RRR Resp: decreased breath sounds bibasilar, normal wob Abd: soft Ext: 1+ upper and lower extremities Neuro: drowsy, intermittently answering questions TDC in place   Imaging: No results found.   Labs: BMET Recent Labs  Lab 04/19/23 0655 04/20/23 0729 04/21/23 0541 04/22/23 0743 04/23/23 0714  NA 138 134* 135  135 136  K 4.6 4.1 4.1 3.9 4.2  CL 112* 104 104 102 105  CO2 13* 17* 18* 22 22  GLUCOSE 101* 145* 127* 135* 102*  BUN 85* 67* 69* 50* 53*  CREATININE 8.33* 6.60* 7.37* 5.82* 6.46*  CALCIUM  8.2* 7.5* 7.6* 7.8* 8.1*  PHOS 7.9* 6.7* 6.2* 4.5 5.1*   CBC Recent Labs  Lab 04/21/23 0541 04/22/23 0743 04/23/23 0714 04/24/23 0629  WBC 10.6* 11.7* 15.8* 17.8*  HGB 9.0* 8.6* 8.7* 8.2*  HCT 25.3* 24.6* 24.6* 23.3*  MCV 76.9* 76.4* 76.4* 77.2*  PLT 313 290 298 273    Medications:     amLODipine   5 mg Oral Daily   apixaban   5 mg Oral BID   aspirin  EC  81 mg Oral Daily   atorvastatin   40 mg Oral Daily   calcium  acetate  667 mg Oral TID WC   carvedilol   25 mg Oral BID WC   Chlorhexidine  Gluconate Cloth  6 each Topical Daily   Chlorhexidine  Gluconate Cloth  6 each Topical Q0600   Chlorhexidine  Gluconate Cloth  6 each Topical Q0600   darbepoetin (ARANESP ) injection - DIALYSIS  100 mcg Subcutaneous Q Mon-1800   feeding supplement  237 mL  Oral BID BM   hydrALAZINE   100 mg Oral Q8H   insulin  aspart  0-6 Units Subcutaneous Q4H   isosorbide  mononitrate  90 mg Oral Daily   pantoprazole   40 mg Oral Daily   senna-docusate  1 tablet Oral Daily   sodium chloride  flush  3-10 mL Intravenous Q12H      GEARLINE NORRIS  [facility] 04/25/2023, 11:35 AM

## 2023-04-25 NOTE — Discharge Instructions (Signed)
 Inpatient Rehab Discharge Instructions  RAYCHEL DOWLER  Discharge date and time: 02/20/24   Activities/Precautions/ Functional Status:  Activity: no lifting, driving, or strenuous exercise for until cleared by provider  Diet: regular diet  Wound Care: none needed   Functional status:  ___ No restrictions     ___ Walk up steps independently ___ 24/7 supervision/assistance   ___ Walk up steps with assistance __x_ Intermittent supervision/assistance  ___ Bathe/dress independently __x_ Walk with walker     ___ Bathe/dress with assistance ___ Walk Independently    ___ Shower independently ___ Walk with assistance    ___ Shower with assistance __X_ No alcohol     ___ Return to work/school ________  Special Instructions:  Cost saving tip: You can purchase Lidocaine  4% patch over the counter.   My questions have been answered and I understand these instructions. I will adhere to these goals and the provided educational materials after my discharge from the hospital.  Patient/Caregiver Signature _______________________________ Date __________  Clinician Signature _______________________________________ Date __________  Please bring this form and your medication list with you to all your follow-up doctor's appointments.    COMMUNITY REFERRALS UPON DISCHARGE:    Home Health:   PT   &   OT                 Agency:[facility] [phone]    Medical Equipment/Items Ordered: HAS ALL NEEDED EQUIPMENT                                                 Agency/Supplier:NA     Information on my medicine - ELIQUIS (apixaban)  This medication education was reviewed with me or my healthcare representative as part of my discharge preparation.     Why was Eliquis prescribed for you? Eliquis was prescribed to treat blood clots that may have been found in the veins of your legs (deep vein thrombosis) or in your lungs (pulmonary embolism) and to reduce the risk of  them occurring again.  What do You need to know about Eliquis ? The dose is ONE 5 mg tablet taken TWICE daily.  Eliquis may be taken with or without food.   Try to take the dose about the same time in the morning and in the evening. If you have difficulty swallowing the tablet whole please discuss with your pharmacist how to take the medication safely.  Take Eliquis exactly as prescribed and DO NOT stop taking Eliquis without talking to the doctor who prescribed the medication.  Stopping may increase your risk of developing a new blood clot.  Refill your prescription before you run out.  After discharge, you should have regular check-up appointments with your healthcare provider that is prescribing your Eliquis.    What do you do if you miss a dose? If a dose of ELIQUIS is not taken at the scheduled time, take it as soon as possible on the same day and twice-daily administration should be resumed. The dose should not be doubled to make up for a missed dose.  Important Safety Information A possible side effect of Eliquis is bleeding. You should call your healthcare provider right away if you experience any of the following: Bleeding from an injury or your nose that does not stop. Unusual colored urine (red or dark brown) or unusual colored stools (red or  black). Unusual bruising for unknown reasons. A serious fall or if you hit your head (even if there is no bleeding).  Some medicines may interact with Eliquis and might increase your risk of bleeding or clotting while on Eliquis. To help avoid this, consult your healthcare provider or pharmacist prior to using any new prescription or non-prescription medications, including herbals, vitamins, non-steroidal anti-inflammatory drugs (NSAIDs) and supplements.  This website has more information on Eliquis (apixaban): http://www.eliquis.com/eliquis/home

## 2023-04-25 NOTE — Progress Notes (Signed)
 PROGRESS NOTE Shirley [name]  FMW:[id number] DOB: 1975/08/27 DOA: 04/15/2023 PCP: Tobie Emmy POUR, NP  Brief Narrative/Hospital Course: Shirley [name] is a 48 y.o. female with a history of CKD stage IV, hypertension, HFmrEF, CAD s/p PCI, stroke, OSA, recurrent VTE.  Patient presented secondary to flu-like symptoms, pleuritic chest pain and chest heaviness found to have influenza A infection. Patient started on Tamiflu  for treatment. Hospitalization complicated by acute metabolic encephalopathy in addition to hypotension, requiring a one day admission to ICU. Although encephalopathy and hypotension improved, patient developed worsening AKI on underlying renal disease in addition to anuria/oliguria. Nephrology consulted for CKD 5 progressed to ESRD-nephrology discussed with patient and family and decided on initiating HD, tdc PALCED 12/26 underwent HD day #1 12/26,day #2 12/28-  for HD #3 12/30 and tolerating well Patient has been CLIP'ed. first outpatient dialysis is scheduled for 1/3 Friday. PT OT recommending skilled nursing facility conditioning but patient is adamant on going home   Subjective: Patient seen and examined  Overall feels better but appears to be swollen still  Less congested,  On RA Overnight afebrile BP 120s to 150s, blood sugar in 150s CBC pending today had leukocytosis and 12/31 Weight 216 lb on admit at 227-232 lb States going for dialysis today Does not go to SNF and will return home with home health  Assessment and Plan: Principal Problem:   Atypical chest pain Active Problems:   Essential hypertension   DM2 (diabetes mellitus, type 2) (HCC)   HLD (hyperlipidemia)   Chronic iron  deficiency anemia   GERD (gastroesophageal reflux disease)   Leukocytosis   Severe sepsis (HCC)   Acute renal failure superimposed on stage 4 chronic kidney disease (HCC)   Chronic systolic CHF (congestive heart failure) (HCC)   Influenza A   Elevated troponin   Influenza   CKD 5  progressed to ESRD: nephrology discussed with patient and family and decided on initiating HD. S/P TDC by IR 12/26 HD day #1 12/26, day #2 12/28-  for HD #3 12/30-tolerated well.  Upper extremity appears bit swollen but respiratory status stable on room air> continue to address fluid status/dialysis per nephrology, going for HD today, likely getting close to going home, has been CLIP'ed. first outpatient dialysis is scheduled for 1/3 Friday She is Hep B+ will need isolation room for HD Recent Labs    04/15/23 2122 04/16/23 0800 04/16/23 1603 04/17/23 0804 04/18/23 1050 04/19/23 0655 04/20/23 0729 04/21/23 0541 04/22/23 0743 04/23/23 0714  BUN 42* 44* 46* 58* 75* 85* 67* 69* 50* 53*  CREATININE 4.51* 4.73* 5.26* 6.51* 7.79* 8.33* 6.60* 7.37* 5.82* 6.46*  CO2 17* 17* 14* 13* 16* 13* 17* 18* 22 22  K 4.5 4.6 4.2 4.6 4.5 4.6 4.1 4.1 3.9 4.2    Metabolic acidosis 2/2 ESRD: Resolved after being on HD.  Off oral bicarb  CKD-MBD: continue renal diet monitor PTH  Anemia of CKD Microcytic anemia: Hb stable. In 8-9gm.anemia panel showed- low iron  studies and S/P IV iron  per nephrology.  Continue with PRNS.  Transfuse  if <7gm, currently in 8 to 9 g range   Severe sepsis due to influenza Influenza A Infection: S/P Tramiflu 12/26.Blood culture no growth so far.  Doing well on room air with a stable respiratory status stable-still with symptoms and slow to recover  Acute metabolic encephalopathy Unclear etiology but presumed secondary to sepsis and complicated by underlying renal disease and uremia.   Mentation stable alert awake oriented  BUT appears somewhat slow,  she is very deconditioned PT OT recommending skilled nursing facility but patient is refusing and wants to go home with daughter-daughter is away at home and is being set up   Hypotension Concerned this was secondary to volume depletion and was given IV fluids while in ICU. BP stable now  Primary hypertension- PTA amlodipine   10 and Coreg  25mg  CAD Angina Currently stable doing well, continue Imdur  and Lipitor  aspirin   Her D-dimer elevated at 1.95. so V/Q scan obtained and was negative for acute PE. Amlodipine  dose decreased to 5 mg on 12/13, continue Coreg  hydralazine -Nephril to address on ongoing basis in the setting of relative   HFrEF LVEF of 45-50% from 02/2022.With evidence of lower leg edema but in the setting of fluid overload from worsening renal function.  Addressing fluid with HD with ultrafiltrate.  Check daily weight and improving at 216lb  Chronic DVT Lower extremity venous duplex on 12/24 revealed a right popliteal vein chronic DVT.  Per nephrology okay to switch to Eliquis  1/1   Diabetes mellitus type 2 Well-controlled A1c, continue SSI and monitor CBG as below Recent Labs  Lab 04/24/23 1827 04/24/23 1958 04/25/23 0027 04/25/23 0419 04/25/23 0807  GLUCAP 258* 269* 167* 169* 159*    Gastroparesis Stable.  No issues   History of CVA Continue home aspirin  and statin.    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  Obesity w/ OSA: Patient's Body mass index is 33.98 kg/m. : Will benefit with PCP follow-up, weight loss  healthy lifestyle and outpatient sleep evaluation.start  Cpap at [facility].  DVT prophylaxis:  Code Status:   Code Status: Full Code Family Communication: plan of care discussed with patient at bedside. Patient status is: Remains hospitalized because of severity of illness Level of care: Med-Surg   Dispo: The patient is from: home with boyfriend            Anticipated disposition: PT OT recommending skilled nursing facility.  Anticipate home in next 24 hours if okay with [facility] Objective: Vitals last 24 hrs: Vitals:   04/24/23 1956 04/25/23 0027 04/25/23 0418 04/25/23 0500  BP: 125/71 123/76 (!) 151/76   Pulse: 94 89 96   Resp: 18 18 18    Temp: 98.5 F (36.9 C) 98.1 F (36.7 C) 98.1 F (36.7 C)   TempSrc: Oral Oral Oral   SpO2: 93% 96% 99%   Weight:     98.4 kg  Height:       Weight change: -2.533 kg  Physical Examination: General exam: alert awake, obese,older than stated age HEENT:Oral mucosa moist, Ear/Nose WNL grossly Respiratory system: Bilaterally diminished BS,no use of accessory muscle. TDC on rt chest c/d/I dressing Cardiovascular system: S1 & S2 +, No JVD. Gastrointestinal system: Abdomen soft,NT,ND, BS+ Nervous System: Alert, awake, moving all extremities,and following commands. Extremities: LE edema neg, edema in UE+, distal peripheral pulses palpable and warm.  Skin: No rashes,no icterus. MSK: Normal muscle bulk,tone, power   Medications reviewed: Scheduled Meds:  amLODipine   5 mg Oral Daily   apixaban   5 mg Oral BID   aspirin  EC  81 mg Oral Daily   atorvastatin   40 mg Oral Daily   calcium  acetate  667 mg Oral TID WC   carvedilol   25 mg Oral BID WC   Chlorhexidine  Gluconate Cloth  6 each Topical Daily   Chlorhexidine  Gluconate Cloth  6 each Topical Q0600   Chlorhexidine  Gluconate Cloth  6 each Topical Q0600   darbepoetin (ARANESP ) injection - DIALYSIS  100 mcg  Subcutaneous Q Mon-1800   feeding supplement  237 mL Oral BID BM   hydrALAZINE   100 mg Oral Q8H   insulin  aspart  0-6 Units Subcutaneous Q4H   isosorbide  mononitrate  90 mg Oral Daily   pantoprazole   40 mg Oral Daily   senna-docusate  1 tablet Oral Daily   sodium chloride  flush  3-10 mL Intravenous Q12H  Continuous Infusions:  heparin  sodium (porcine)      Diet Order             Diet renal with fluid restriction Fluid restriction: 1200 mL Fluid; Room service appropriate? Yes; Fluid consistency: Thin  Diet effective now                  Intake/Output Summary (Last 24 hours) at 04/25/2023 9177 Last data filed at 04/25/2023 0600 Gross per 24 hour  Intake 660 ml  Output --  Net 660 ml   Net IO Since Admission: 1,111.05 mL [04/25/23 0822]  Wt Readings from Last 3 Encounters:  04/25/23 98.4 kg  12/28/22 90.7 kg  09/28/22 90.7 kg     Unresulted  Labs (From admission, onward)     Start     Ordered   04/20/23 0500  CBC  Daily,   R     Question:  Specimen collection method  Answer:  Lab=Lab collect   04/18/23 2253   04/18/23 1017  CK  Daily,   R     Question:  Specimen collection method  Answer:  Lab=Lab collect   04/18/23 1016   Unscheduled  Occult blood card to lab, stool  As needed,   R      04/18/23 1740   Signed and Held  Renal function panel  Once,   R       Question:  Specimen collection method  Answer:  Lab=Lab collect   Signed and Held   Signed and Held  CBC  Once,   R       Question:  Specimen collection method  Answer:  Lab=Lab collect   Signed and Held          Data Reviewed: I have personally reviewed following labs and imaging studies CBC: Recent Labs  Lab 04/20/23 0828 04/21/23 0541 04/22/23 0743 04/23/23 0714 04/24/23 0629  WBC 11.9* 10.6* 11.7* 15.8* 17.8*  HGB 9.0* 9.0* 8.6* 8.7* 8.2*  HCT 25.4* 25.3* 24.6* 24.6* 23.3*  MCV 76.3* 76.9* 76.4* 76.4* 77.2*  PLT 317 313 290 298 273   Basic Metabolic Panel:  Recent Labs  Lab 04/19/23 0655 04/20/23 0729 04/21/23 0541 04/22/23 0743 04/23/23 0714  NA 138 134* 135 135 136  K 4.6 4.1 4.1 3.9 4.2  CL 112* 104 104 102 105  CO2 13* 17* 18* 22 22  GLUCOSE 101* 145* 127* 135* 102*  BUN 85* 67* 69* 50* 53*  CREATININE 8.33* 6.60* 7.37* 5.82* 6.46*  CALCIUM  8.2* 7.5* 7.6* 7.8* 8.1*  PHOS 7.9* 6.7* 6.2* 4.5 5.1*   GFR: Estimated Creatinine Clearance: 12.7 mL/min (A) (by C-G formula based on SCr of 6.46 mg/dL (H)). Liver Function Tests:  Recent Labs  Lab 04/19/23 0655 04/20/23 0729 04/21/23 0541 04/22/23 0743 04/23/23 0714  ALBUMIN  2.3* 2.0* 2.1* 2.1* 2.2*   No results for input(s): LIPASE, AMYLASE in the last 168 hours. No results for input(s): AMMONIA in the last 168 hours. No results for input(s): PROCALCITON, LATICACIDVEN in the last 168 hours.  COVID negative on 04/15/2023 9:23 PM RSV virus negative Blood culture no  growth  from 04/16/2023 at 2:16 AM MRSA PCR negative on 12/23 5:07 PM.  Antimicrobials/Microbiology: Anti-infectives (From admission, onward)    Start     Dose/Rate Route Frequency Ordered Stop   04/19/23 1550  ceFAZolin  (ANCEF ) IVPB 2g/100 mL premix        over 30 Minutes Intravenous Continuous PRN 04/19/23 1550 04/19/23 1613   04/19/23 1115  ceFAZolin  (ANCEF ) IVPB 2g/100 mL premix       Note to Pharmacy: TO BE GIVEN IR   2 g 200 mL/hr over 30 Minutes Intravenous To Radiology 04/19/23 1022 04/19/23 1643   04/19/23 1000  oseltamivir  (TAMIFLU ) capsule 30 mg        30 mg Oral  Once 04/19/23 0641 04/19/23 0831   04/16/23 1300  oseltamivir  (TAMIFLU ) capsule 30 mg  Status:  Discontinued        30 mg Oral Daily 04/16/23 1252 04/18/23 1403         Component Value Date/Time   SDES BLOOD SITE NOT SPECIFIED 04/16/2023 0226   SPECREQUEST  04/16/2023 0226    BOTTLES DRAWN AEROBIC AND ANAEROBIC Blood Culture results may not be optimal due to an inadequate volume of blood received in culture bottles   CULT  04/16/2023 0226    NO GROWTH 5 DAYS Performed at [facility], [address] [place], [place] [address]    REPTSTATUS 04/21/2023 FINAL 04/16/2023 9773  Radiology Studies: No results found.  LOS: 10 days  Total time spent in review of labs and imaging, patient evaluation, formulation of plan, documentation and communication with family: 35 minutes  Mennie LAMY, MD [facility]  04/25/2023, 8:22 AM

## 2023-04-26 ENCOUNTER — Other Ambulatory Visit (HOSPITAL_COMMUNITY): Payer: Self-pay

## 2023-04-26 DIAGNOSIS — N185 Chronic kidney disease, stage 5: Secondary | ICD-10-CM

## 2023-04-26 DIAGNOSIS — J101 Influenza due to other identified influenza virus with other respiratory manifestations: Secondary | ICD-10-CM | POA: Diagnosis not present

## 2023-04-26 DIAGNOSIS — N179 Acute kidney failure, unspecified: Secondary | ICD-10-CM | POA: Diagnosis not present

## 2023-04-26 LAB — CBC
HCT: 26.9 % — ABNORMAL LOW (ref 36.0–46.0)
Hemoglobin: 9.2 g/dL — ABNORMAL LOW (ref 12.0–15.0)
MCH: 27.1 pg (ref 26.0–34.0)
MCHC: 34.2 g/dL (ref 30.0–36.0)
MCV: 79.4 fL — ABNORMAL LOW (ref 80.0–100.0)
Platelets: 323 10*3/uL (ref 150–400)
RBC: 3.39 MIL/uL — ABNORMAL LOW (ref 3.87–5.11)
RDW: 16.7 % — ABNORMAL HIGH (ref 11.5–15.5)
WBC: 16.1 10*3/uL — ABNORMAL HIGH (ref 4.0–10.5)
nRBC: 0.2 % (ref 0.0–0.2)

## 2023-04-26 LAB — GLUCOSE, CAPILLARY
Glucose-Capillary: 153 mg/dL — ABNORMAL HIGH (ref 70–99)
Glucose-Capillary: 156 mg/dL — ABNORMAL HIGH (ref 70–99)
Glucose-Capillary: 139 mg/dL — ABNORMAL HIGH (ref 70–99)
Glucose-Capillary: 205 mg/dL — ABNORMAL HIGH (ref 70–99)
Glucose-Capillary: 158 mg/dL — ABNORMAL HIGH (ref 70–99)

## 2023-04-26 LAB — CK: Total CK: 108 U/L (ref 38–234)

## 2023-04-26 MED ORDER — HYDROCODONE-ACETAMINOPHEN 5-325 MG PO TABS: 1.0000 | ORAL_TABLET | ORAL | Status: DC | PRN

## 2023-04-26 MED ORDER — AMLODIPINE BESYLATE 5 MG PO TABS
5.0000 mg | ORAL_TABLET | Freq: Every day | ORAL | 3 refills | Status: AC
  Filled 2023-04-26: qty 30, 30d supply, fill #0

## 2023-04-26 MED ORDER — ISOSORBIDE MONONITRATE ER 30 MG PO TB24
90.0000 mg | ORAL_TABLET | Freq: Every day | ORAL | 3 refills | Status: AC
  Filled 2023-04-26: qty 30, 10d supply, fill #0

## 2023-04-26 MED ORDER — CALCIUM ACETATE (PHOS BINDER) 667 MG PO CAPS
667.0000 mg | ORAL_CAPSULE | Freq: Three times a day (TID) | ORAL | 3 refills | Status: AC
  Filled 2023-04-26: qty 24, 8d supply, fill #0

## 2023-04-26 MED ORDER — APIXABAN 5 MG PO TABS
5.0000 mg | ORAL_TABLET | Freq: Two times a day (BID) | ORAL | 3 refills | Status: AC
  Filled 2023-04-26: qty 60, 30d supply, fill #0

## 2023-04-26 NOTE — Plan of Care (Signed)
  Problem: Education: Goal: Ability to describe self-care measures that may prevent or decrease complications (Diabetes Survival Skills Education) will improve Outcome: Progressing Goal: Individualized Educational Video(s) Outcome: Progressing   Problem: Coping: Goal: Ability to adjust to condition or change in health will improve Outcome: Progressing   Problem: Fluid Volume: Goal: Ability to maintain a balanced intake and output will improve Outcome: Progressing   Problem: Health Behavior/Discharge Planning: Goal: Ability to identify and utilize available resources and services will improve Outcome: Progressing Goal: Ability to manage health-related needs will improve Outcome: Progressing   Problem: Metabolic: Goal: Ability to maintain appropriate glucose levels will improve Outcome: Progressing   Problem: Nutritional: Goal: Maintenance of adequate nutrition will improve Outcome: Progressing Goal: Progress toward achieving an optimal weight will improve Outcome: Progressing   Problem: Skin Integrity: Goal: Risk for impaired skin integrity will decrease Outcome: Progressing   Problem: Tissue Perfusion: Goal: Adequacy of tissue perfusion will improve Outcome: Progressing   Problem: Education: Goal: Knowledge of General Education information will improve Description: Including pain rating scale, medication(s)/side effects and non-pharmacologic comfort measures Outcome: Progressing   Problem: Health Behavior/Discharge Planning: Goal: Ability to manage health-related needs will improve Outcome: Progressing   Problem: Clinical Measurements: Goal: Ability to maintain clinical measurements within normal limits will improve Outcome: Progressing Goal: Will remain free from infection Outcome: Progressing Goal: Diagnostic test results will improve Outcome: Progressing Goal: Respiratory complications will improve Outcome: Progressing Goal: Cardiovascular complication will  be avoided Outcome: Progressing   Problem: Activity: Goal: Risk for activity intolerance will decrease Outcome: Progressing   Problem: Nutrition: Goal: Adequate nutrition will be maintained Outcome: Progressing   Problem: Coping: Goal: Level of anxiety will decrease Outcome: Progressing   Problem: Elimination: Goal: Will not experience complications related to bowel motility Outcome: Progressing Goal: Will not experience complications related to urinary retention Outcome: Progressing   Problem: Pain Management: Goal: General experience of comfort will improve Outcome: Progressing   Problem: Safety: Goal: Ability to remain free from injury will improve Outcome: Progressing   Problem: Skin Integrity: Goal: Risk for impaired skin integrity will decrease Outcome: Progressing   Problem: Education: Goal: Knowledge of disease and its progression will improve Outcome: Progressing   Problem: Fluid Volume: Goal: Compliance with measures to maintain balanced fluid volume will improve Outcome: Progressing   Problem: Health Behavior/Discharge Planning: Goal: Ability to manage health-related needs will improve Outcome: Progressing   Problem: Nutritional: Goal: Ability to make healthy dietary choices will improve Outcome: Progressing   Problem: Clinical Measurements: Goal: Complications related to the disease process, condition or treatment will be avoided or minimized Outcome: Progressing

## 2023-04-26 NOTE — Progress Notes (Signed)
 Physical Therapy Treatment Patient Details Name: Shirley [name] MRN: [id number] DOB: 09/01/1975 Today's Date: 04/26/2023   History of Present Illness 48 y.o. female presented 12/22 secondary to flu-like symptoms, pleuritic chest pain and chest heaviness found to have influenza A infection. Hospitalization complicated by acute metabolic encephalopathy in addition to hypotension. 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 with a history of CKD stage IV, hypertension, HFmrEF, CAD s/p PCI, stroke, OSA, recurrent VTE.    PT Comments  Patient progressing towards functional goals. Ambulated to and from restroom today with CGA, heavy reliance on RW for support. Continues to iterate she will have 24/7 assist available from sister and significant other. Feels confident that she can return home safely. If care available, HHPT appropriate to continue building strength, balance, and functional capacity. Patient will continue to benefit from skilled physical therapy services to further improve independence with functional mobility.    If plan is discharge home, recommend the following: A Mccrory help with walking and/or transfers;Assistance with cooking/housework;Direct supervision/assist for medications management;Direct supervision/assist for financial management;Assist for transportation;Supervision due to cognitive status;A Rask help with bathing/dressing/bathroom   Can travel by private vehicle     Yes  Equipment Recommendations  Rolling walker (2 wheels);BSC/3in1    Recommendations for Other Services       Precautions / Restrictions Precautions Precautions: Fall Restrictions Weight Bearing Restrictions Per Provider Order: No     Mobility  Bed Mobility Overal bed mobility: Needs Assistance Bed Mobility: Supine to Sit, Sit to Supine     Supine to sit: Supervision, HOB elevated Sit to supine: Supervision   General bed mobility comments:  Supervision for safety, requires extra time.    Transfers Overall transfer level: Needs assistance Equipment used: Rolling walker (2 wheels) Transfers: Sit to/from Stand Sit to Stand: Contact guard assist           General transfer comment: CGA for safety, slow to rise but stable with RW for support. Cues for technique. Perfromed from bed x2 and BSC over toilet x1. No physicqal assist required.    Ambulation/Gait Ambulation/Gait assistance: Contact guard assist Gait Distance (Feet): 15 Feet (x2) Assistive device: Rolling walker (2 wheels) Gait Pattern/deviations: Trunk flexed, Step-through pattern, Decreased stride length, Shuffle Gait velocity: reduced Gait velocity interpretation: <1.31 ft/sec, indicative of household ambulator   General Gait Details: Slow and shuffled with wide BOS. Leaning forward onto RW heavily but improved with cues for improved symmetry and posture, proximity to AD. No buckling noted. Fatigues easily. performed 2 short bouts in room.   Stairs             Wheelchair Mobility     Tilt Bed    Modified Rankin (Stroke Patients Only)       Balance Overall balance assessment: Needs assistance Sitting-balance support: No upper extremity supported, Feet supported Sitting balance-Leahy Scale: Good     Standing balance support: Bilateral upper extremity supported, Reliant on assistive device for balance Standing balance-Leahy Scale: Poor                              Cognition Arousal: Alert Behavior During Therapy: Flat affect Overall Cognitive Status: No family/caregiver present to determine baseline cognitive functioning                                 General Comments:  Still a Ebbert delayed but improved from prior visits. Following commands and conversing appropriately        Exercises      General Comments General comments (skin integrity, edema, etc.): Complains of fatigue and dizziness. States her BP  was taken earlier while she was feeling this way and it was reportedly fine. HR was 89. Had +BM, small void, she required CGA for self care/ hygiene. Cues for Safety and use of AD for stability.      Pertinent Vitals/Pain Pain Assessment Pain Assessment: Faces Faces Pain Scale: Hurts [place] more Pain Location: generalized Pain Descriptors / Indicators: Moaning Pain Intervention(s): Monitored during session    Home Living                          Prior Function            PT Goals (current goals can now be found in the care plan section) Acute Rehab PT Goals Patient Stated Goal: to go home PT Goal Formulation: With patient Time For Goal Achievement: 05/04/23 Potential to Achieve Goals: Good Progress towards PT goals: Progressing toward goals    Frequency    Min 1X/week      PT Plan      Co-evaluation              AM-PAC PT 6 Clicks Mobility   Outcome Measure  Help needed turning from your back to your side while in a flat bed without using bedrails?: None Help needed moving from lying on your back to sitting on the side of a flat bed without using bedrails?: A Avetisyan Help needed moving to and from a bed to a chair (including a wheelchair)?: A Delvecchio Help needed standing up from a chair using your arms (e.g., wheelchair or bedside chair)?: A Aydin Help needed to walk in hospital room?: A Zeoli Help needed climbing 3-5 steps with a railing? : A Lot 6 Click Score: 18    End of Session Equipment Utilized During Treatment: Gait belt Activity Tolerance: Patient limited by fatigue Patient left: in bed;with call bell/phone within reach;with bed alarm set Nurse Communication: Mobility status PT Visit Diagnosis: Unsteadiness on feet (R26.81);Muscle weakness (generalized) (M62.81);Difficulty in walking, not elsewhere classified (R26.2);Other symptoms and signs involving the nervous system (R29.898)     Time: 8996-8952 PT Time Calculation (min) (ACUTE  ONLY): 44 min  Charges:    $Gait Training: 8-22 mins $Therapeutic Activity: 8-22 mins $Self Care/Home Management: 8-22 PT General Charges $$ ACUTE PT VISIT: 1 Visit                     Leontine Roads, PT, DPT [facility]  Rehabilitation Services Physical Therapist Office: [phone] Website: [email or web address]    Leontine GORMAN Roads 04/26/2023, 11:19 AM

## 2023-04-26 NOTE — Progress Notes (Addendum)
 Pt to d/c to home with daughter today. Contacted [facility] to be advised that pt will d/c today and should start tomorrow as planned. Out-pt HD arrangements placed on pt's AVS. Contacted pt's daughter Kathryne) via phone and left a message with a reminder of pt's out-pt HD appt for tomorrow (discussed earlier this week). Renal PA has completed orders and sent to clinic for tomorrow's treatment.   Randine Mungo Renal Navigator [phone]  Addendum at 2:30 pm: Received a return call from pt's daughter. Reminded daughter of pt's out-pt HD appt tomorrow. Daughter voiced understanding.

## 2023-04-26 NOTE — Progress Notes (Signed)
 [facility] Progress Note    Assessment/ Plan:   CKD5, progressed to ESRD -Dr. Jerrye discussed with patient's daughter on 12/25 in regards to starting HD and they did consent - HD#1 12/27 early AM , s/p TDC with IR  - renal navigator involved  -tentatively planning on HD#2 12/28 then #3 Monday 12/30, continuing MWF schedule -  Hep B + - CLIP'd  Severe sepsis Influenza A -per primary service-  slow to improve  HTN -UF as tolerated with HD. BP acceptable norvasc  10, coreg  25 BID, hydral 100 tid and PRNs-  hopefully will be able to wean BP meds when volume status is better-  will try to dec norvasc  to 5  Metabolic acidosis - improving  HFrEF -UF as tolerated with HD  DM2 -Per primary  Anemia of CKD -transfuse prn for hgb <7 - Fe panel low, treat with IV iron  first-  hgb 9-10 -  dropped below 9 today -  will add ESA   Chronic DVT -on heparin  gtt -per primary, can resume DOAC, AVF as OP  CKD MBD -renal diet-  added phoslo  for binding  -checking PTH -  is 273- no meds yet   Hep B + - needs iso room  Subjective:    Seen in room.  Sleeping, easily arousable.  Off O2.  Asking for pain meds   Objective:   BP 136/84   Pulse 88   Temp 98.1 F (36.7 C) (Oral)   Resp 17   Ht 5' 7 (1.702 m)   Wt 96.1 kg   LMP 01/22/2021 Comment: neg preg test 04/17/21  SpO2 98%   BMI 33.18 kg/m   Intake/Output Summary (Last 24 hours) at 04/26/2023 1137 Last data filed at 04/25/2023 2102 Gross per 24 hour  Intake 70 ml  Output 3000 ml  Net -2930 ml   Weight change: -0.167 kg  Physical Exam: Gen: ill appearing, NAD, drowsy CVS: RRR Resp: decreased breath sounds bibasilar, normal wob Abd: soft Ext: 1+ upper and lower extremities Neuro: drowsy, intermittently answering questions TDC in place   Imaging: No results found.   Labs: BMET Recent Labs  Lab 04/20/23 0729 04/21/23 0541 04/22/23 0743 04/23/23 0714  NA 134* 135 135 136  K 4.1 4.1 3.9 4.2   CL 104 104 102 105  CO2 17* 18* 22 22  GLUCOSE 145* 127* 135* 102*  BUN 67* 69* 50* 53*  CREATININE 6.60* 7.37* 5.82* 6.46*  CALCIUM  7.5* 7.6* 7.8* 8.1*  PHOS 6.7* 6.2* 4.5 5.1*   CBC Recent Labs  Lab 04/23/23 0714 04/24/23 0629 04/25/23 1457 04/26/23 0759  WBC 15.8* 17.8* 15.8* 16.1*  HGB 8.7* 8.2* 8.9* 9.2*  HCT 24.6* 23.3* 25.7* 26.9*  MCV 76.4* 77.2* 77.4* 79.4*  PLT 298 273 297 323    Medications:     amLODipine   5 mg Oral Daily   apixaban   5 mg Oral BID   aspirin  EC  81 mg Oral Daily   atorvastatin   40 mg Oral Daily   calcium  acetate  667 mg Oral TID WC   carvedilol   25 mg Oral BID WC   Chlorhexidine  Gluconate Cloth  6 each Topical Daily   Chlorhexidine  Gluconate Cloth  6 each Topical Q0600   Chlorhexidine  Gluconate Cloth  6 each Topical Q0600   darbepoetin (ARANESP ) injection - DIALYSIS  100 mcg Subcutaneous Q Mon-1800   feeding supplement  237 mL Oral BID BM   hydrALAZINE   100 mg Oral Q8H   insulin   aspart  0-6 Units Subcutaneous Q4H   isosorbide  mononitrate  90 mg Oral Daily   pantoprazole   40 mg Oral Daily   senna-docusate  1 tablet Oral BID   sodium chloride  flush  3-10 mL Intravenous Q12H      Shirley [name]  [facility] 04/26/2023, 11:37 AM

## 2023-04-26 NOTE — Discharge Summary (Signed)
 Physician Discharge Summary   Shirley [name] FMW:[id number] DOB: April 30, 1975 DOA: 04/15/2023  PCP: Tobie Emmy POUR, NP  Admit date: 04/15/2023 Discharge date:  04/26/2023  Admitted From: Home Disposition:  Home Discharging physician: Alm Apo, MD Barriers to discharge:   Recommendations at discharge: Needs referral to vascular surgery for fistula evaluation  Adjust blood pressure regimen further as necessary  Discharge Condition: stable CODE STATUS: Full Diet recommendation:  Diet Orders (From admission, onward)     Start     Ordered   04/19/23 1651  Diet renal with fluid restriction Fluid restriction: 1200 mL Fluid; Room service appropriate? Yes; Fluid consistency: Thin  Diet effective now       Question Answer Comment  Fluid restriction: 1200 mL Fluid   Room service appropriate? Yes   Fluid consistency: Thin      04/19/23 1650            Hospital Course: Shirley [name] is a 48 y.o. female with a history of CKD stage V, hypertension, HFmrEF, CAD s/p PCI, stroke, OSA, recurrent VTE.  Patient presented secondary to flu-like symptoms, pleuritic chest pain and chest heaviness found to have influenza A infection. Patient started on Tamiflu  for treatment. Hospitalization complicated by acute metabolic encephalopathy in addition to hypotension, requiring a one day admission to ICU.  Although encephalopathy and hypotension improved, patient developed worsening renal function with development of anuria/oliguria. Nephrology consulted for CKD progressed to ESRD-nephrology discussed with patient and family and decided on initiating HD. Tunneled dialysis catheter placed 04/19/2023 and patient started on hemodialysis. She was arranged for outpatient HD prior to discharge. She will need outpatient referral to vascular surgery for permanent access as well.   Assessment and Plan:  CKD 5 progressed to ESRD: - nephrology discussed with patient and family and decided on initiating  HD. S/P TDC by IR 12/26 - tolerated inpt HD has been CLIP'ed. first outpatient dialysis is scheduled for 1/3 Friday She is Hep B+ will need isolation room for HD  Metabolic acidosis 2/2 ESRD Resolved after being on HD.  Off oral bicarb   CKD-MBD: continue renal diet monitor PTH   Anemia of CKD Microcytic anemia Hb stable. In 8-9gm.anemia panel showed- low iron  studies and S/P IV iron  per nephrology.  Continue with PRNS.  Transfuse  if <7gm, currently in 8 to 9 g range   Severe sepsis due to influenza Influenza A Infection: S/P Tramiflu 12/26.Blood culture no growth so far.  Doing well on room air with a stable respiratory status stable-still with symptoms and slow to recover   Acute metabolic encephalopathy - resolved  Unclear etiology but presumed secondary to sepsis and complicated by underlying renal disease and uremia.   Mentation stable alert awake oriented  BUT appears somewhat slow, she is very deconditioned PT OT recommending skilled nursing facility but patient is refusing and wants to go home with daughter   Hypotension - resolved  Concerned this was secondary to volume depletion and was given IV fluids while in ICU. BP stable now   Primary hypertension CAD Angina Currently stable doing well, continue Imdur  and Lipitor  aspirin   Her D-dimer elevated at 1.95. so V/Q scan obtained and was negative for acute PE. Amlodipine  dose decreased to 5 mg on 12/13, continue Coreg  hydralazine  - outpatient further adjustment of regimen after further HD   HFrEF LVEF of 45-50% from 02/2022.With evidence of lower leg edema but in the setting of fluid overload from worsening renal function.  Addressing fluid with HD  with UF   Chronic DVT Lower extremity venous duplex on 12/24 revealed a right popliteal vein chronic DVT.  Per nephrology okay to switch to Eliquis  1/1   Diabetes mellitus type 2 -A1c 6.5%.  Continue diet control  Gastroparesis Stable.  No issues   History of  CVA Continue home aspirin  and statin.     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   Obesity w/ OSA: Patient's Body mass index is 33.98 kg/m. : Will benefit with PCP follow-up, weight loss  healthy lifestyle and outpatient sleep evaluation.start  C[facility]ap at [facility].   [name] acute and chronic medical conditions were treated accordingly. On day of discharge, patient was felt deemed stable for discharge. Patient/family member advised to call PCP or come back to ER if needed.   Principal Diagnosis: Acute renal failure superimposed on stage 4 chronic kidney disease [facility])  Discharge Diagnoses: Active Hospital Problems   Diagnosis Date Noted   Acute renal failure superimposed on stage 4 chronic kidney disease (HCC) 03/21/2022    Priority: 1.   Essential hypertension 01/16/2006    Priority: 2.   Influenza A 04/16/2023    Priority: 3.   DM2 (diabetes mellitus, type 2) (HCC) 01/16/1989    Priority: 5.   Elevated troponin 04/16/2023   Influenza 04/16/2023   Chronic systolic CHF (congestive heart failure) (HCC) 05/16/2022   Severe sepsis (HCC) 04/17/2021   Leukocytosis 10/10/2017   GERD (gastroesophageal reflux disease) 10/10/2017   Chronic iron  deficiency anemia 11/22/2009   HLD (hyperlipidemia) 08/30/2009    Resolved Hospital Problems   Diagnosis Date Noted Date Resolved   Atypical chest pain 04/15/2023 04/26/2023    Priority: 2.     Discharge Instructions     Increase activity slowly   Complete by: As directed       Allergies as of 04/26/2023       Reactions   Morphine  And Codeine  Itching, Swelling   Zestril [lisinopril] Nausea Only, Swelling        Medication List     STOP taking these medications    famotidine  20 MG tablet Commonly known as: PEPCID    furosemide  20 MG tablet Commonly known as: LASIX    naproxen  375 MG tablet Commonly known as: NAPROSYN    promethazine -dextromethorphan  6.25-15 MG/5ML syrup Commonly known as: PROMETHAZINE -DM        TAKE these medications    albuterol  108 (90 Base) MCG/ACT inhaler Commonly known as: VENTOLIN  HFA Inhale 2 puffs into the lungs every 4 (four) hours as needed for wheezing or shortness of breath.   amLODipine  5 MG tablet Commonly known as: NORVASC  Take 1 tablet (5 mg total) by mouth daily. Start taking on: April 27, 2023 What changed:  medication strength how much to take   apixaban  5 MG Tabs tablet Commonly known as: ELIQUIS  Take 1 tablet (5 mg total) by mouth 2 (two) times daily.   aspirin  81 MG chewable tablet Chew 81 mg by mouth daily.   atorvastatin  40 MG tablet Commonly known as: LIPITOR  Take 1 tablet (40 mg total) by mouth daily.   calcium  acetate 667 MG capsule Commonly known as: PHOSLO  Take 1 capsule (667 mg total) by mouth 3 (three) times daily with meals.   carvedilol  25 MG tablet Commonly known as: COREG  Take 1 tablet (25 mg total) by mouth 2 (two) times daily with a meal.   cyclobenzaprine  10 MG tablet Commonly known as: FLEXERIL  Take 10 mg by mouth 3 (three) times daily as needed for muscle  spasms.   diphenhydrAMINE  25 MG tablet Commonly known as: BENADRYL  Take 25 mg by mouth every 6 (six) hours as needed for allergies.   ferrous sulfate  325 (65 FE) MG tablet Take 325 mg by mouth daily with breakfast.   hydrALAZINE  100 MG tablet Commonly known as: APRESOLINE  Take 1 tablet (100 mg total) by mouth every 8 (eight) hours.   INSULIN  SYRINGE .5CC/28G 28G X 1/2 0.5 ML Misc Please provide 1 month supply   isosorbide  mononitrate 30 MG 24 hr tablet Commonly known as: IMDUR  Take 3 tablets (90 mg total) by mouth daily. Start taking on: April 27, 2023   linaclotide  145 MCG Caps capsule Commonly known as: LINZESS  Take 145 mcg by mouth daily as needed (for constipation).   meclizine  25 MG tablet Commonly known as: ANTIVERT  Take 1 tablet (25 mg total) by mouth 3 (three) times daily as needed for dizziness.   omeprazole  40 MG capsule Commonly  known as: PRILOSEC Take 40 mg by mouth daily before breakfast.   OneTouch Delica Lancets 33G Misc Use to check blood sugar 3 times daily. Dx codes: E11.8, E11.65   OneTouch Verio Flex System w/Device Kit Use in the morning, at noon, and at bedtime.   OneTouch Verio test strip Generic drug: glucose blood Use as instructed to check blood sugar 3 times daily. Dx codes: E11.8, E11.65   oxyCODONE -acetaminophen  10-325 MG tablet Commonly known as: PERCOCET Take 1 tablet by mouth 3 (three) times daily.   Pen Needles 3/16 31G X 5 MM Misc 1 Device by Does not apply route 4 (four) times daily - after meals and at bedtime.   senna 8.6 MG Tabs tablet Commonly known as: SENOKOT Take 1 tablet (8.6 mg total) by mouth 2 (two) times daily.               Durable Medical Equipment  (From admission, onward)           Start     Ordered   04/24/23 1226  For home use only DME Walker rolling  Once       Question Answer Comment  Walker: With 5 Inch Wheels   Patient needs a walker to treat with the following condition Generalized weakness      04/24/23 1226   04/24/23 1226  For home use only DME Bedside commode  Once       Question:  Patient needs a bedside commode to treat with the following condition  Answer:  Generalized weakness   04/24/23 1226            Follow-up Information     [facility]. Go on 04/27/2023.   Why: Schedule is Monday, Wednesday, Friday with 10:25 am start time.  On Friday (1/3), please arrive at 9:30 am to complete paperwork prior to treatment. Contact information: [address] [place] [address] [phone]         [facility] Follow up.   Specialty: Home Health Services Why: [facility] will providine physical therapy at the home.  They will call you in the next 24-48 hours to set up services. Contact information: [address] [place] [address] [phone]          [facility] Follow up.   Why: [facility] will provide you with a RW and 3:1 before you are discharged home. Contact information: [address] [place] [address] [phone]  Allergies  Allergen Reactions   Morphine  And Codeine  Itching and Swelling   Zestril [Lisinopril] Nausea Only and Swelling    Consultations: Nephrology   Procedures:   Discharge Exam: BP 129/74   Pulse 83   Temp 98.1 F (36.7 C) (Oral)   Resp 17   Ht 5' 7 (1.702 m)   Wt 96.1 kg   LMP 01/22/2021 Comment: neg preg test 04/17/21  SpO2 98%   BMI 33.18 kg/m  Physical Exam Constitutional:      Appearance: Normal appearance.  HENT:     Head: Normocephalic and atraumatic.     Mouth/Throat:     Mouth: Mucous membranes are moist.  Eyes:     Extraocular Movements: Extraocular movements intact.  Cardiovascular:     Rate and Rhythm: Normal rate and regular rhythm.  Pulmonary:     Effort: Pulmonary effort is normal. No respiratory distress.     Breath sounds: No wheezing.     Comments: Mild coarse breath sounds Chest:     Comments: Right PermCath in place Abdominal:     General: Bowel sounds are normal. There is no distension.     Palpations: Abdomen is soft.     Tenderness: There is no abdominal tenderness.  Musculoskeletal:        General: Normal range of motion.     Cervical back: Normal range of motion and neck supple.     Right lower leg: Edema (trace) present.     Left lower leg: Edema (trace) present.  Skin:    General: Skin is warm and dry.  Neurological:     General: No focal deficit present.     Mental Status: She is alert.  Psychiatric:        Mood and Affect: Mood normal.      The results of significant diagnostics from this hospitalization (including imaging, microbiology, ancillary and laboratory) are listed below for reference.   Microbiology: Recent Results (from the past 240 hours)  MRSA Next Gen by PCR, Nasal      Status: None   Collection Time: 04/16/23  5:07 PM   Specimen: Nasal Mucosa; Nasal Swab  Result Value Ref Range Status   MRSA by PCR Next Gen NOT DETECTED NOT DETECTED Final    Comment: (NOTE) The GeneXpert MRSA Assay (FDA approved for NASAL specimens only), is one component of a comprehensive MRSA colonization surveillance program. It is not intended to diagnose MRSA infection nor to guide or monitor treatment for MRSA infections. Test performance is not FDA approved in patients less than 80 years old. Performed at [facility], [address], [place], [place] [address]      Labs: BNP (last 3 results) Recent Labs    08/07/22 2129 08/28/22 1418 04/15/23 2120  BNP 2,047.2* 1,447.0* 1,788.7*   Basic Metabolic Panel: Recent Labs  Lab 04/20/23 0729 04/21/23 0541 04/22/23 0743 04/23/23 0714  NA 134* 135 135 136  K 4.1 4.1 3.9 4.2  CL 104 104 102 105  CO2 17* 18* 22 22  GLUCOSE 145* 127* 135* 102*  BUN 67* 69* 50* 53*  CREATININE 6.60* 7.37* 5.82* 6.46*  CALCIUM  7.5* 7.6* 7.8* 8.1*  PHOS 6.7* 6.2* 4.5 5.1*   Liver Function Tests: Recent Labs  Lab 04/20/23 0729 04/21/23 0541 04/22/23 0743 04/23/23 0714  ALBUMIN  2.0* 2.1* 2.1* 2.2*   No results for input(s): LIPASE, AMYLASE in the last 168 hours. No results for input(s): AMMONIA in the last 168 hours. CBC: Recent Labs  Lab 04/22/23 0743 04/23/23 0714 04/24/23 0629 04/25/23 1457 04/26/23 0759  WBC 11.7* 15.8* 17.8* 15.8* 16.1*  HGB 8.6* 8.7* 8.2* 8.9* 9.2*  HCT 24.6* 24.6* 23.3* 25.7* 26.9*  MCV 76.4* 76.4* 77.2* 77.4* 79.4*  PLT 290 298 273 297 323   Cardiac Enzymes: Recent Labs  Lab 04/22/23 0743 04/23/23 0714 04/24/23 0629 04/25/23 1457 04/26/23 0759  CKTOTAL 531* 336* 183 102 108   BNP: Invalid input(s): POCBNP CBG: Recent Labs  Lab 04/25/23 2026 04/26/23 0112 04/26/23 0552 04/26/23 0752 04/26/23 1146  GLUCAP 208* 153* 156* 139* 205*   D-Dimer No results for input(s):  DDIMER in the last 72 hours. Hgb A1c No results for input(s): HGBA1C in the last 72 hours. Lipid Profile No results for input(s): CHOL, HDL, LDLCALC, TRIG, CHOLHDL, LDLDIRECT in the last 72 hours. Thyroid function studies No results for input(s): TSH, T4TOTAL, T3FREE, THYROIDAB in the last 72 hours.  Invalid input(s): FREET3 Anemia work up No results for input(s): VITAMINB12, FOLATE, FERRITIN, TIBC, IRON , RETICCTPCT in the last 72 hours. Urinalysis    Component Value Date/Time   COLORURINE YELLOW 04/16/2023 0017   APPEARANCEUR CLEAR 04/16/2023 0017   LABSPEC 1.014 04/16/2023 0017   PHURINE 6.0 04/16/2023 0017   GLUCOSEU 150 (A) 04/16/2023 0017   GLUCOSEU > 1000 mg/dL (A) 96/83/7989 7884   HGBUR SMALL (A) 04/16/2023 0017   HGBUR negative 08/12/2008 1444   BILIRUBINUR NEGATIVE 04/16/2023 0017   BILIRUBINUR neg 02/07/2017 1609   KETONESUR NEGATIVE 04/16/2023 0017   PROTEINUR >=300 (A) 04/16/2023 0017   UROBILINOGEN 1.0 02/07/2017 1609   UROBILINOGEN 0.2 11/11/2014 1658   NITRITE NEGATIVE 04/16/2023 0017   LEUKOCYTESUR NEGATIVE 04/16/2023 0017   Sepsis Labs Recent Labs  Lab 04/23/23 0714 04/24/23 0629 04/25/23 1457 04/26/23 0759  WBC 15.8* 17.8* 15.8* 16.1*   Microbiology Recent Results (from the past 240 hours)  MRSA Next Gen by PCR, Nasal     Status: None   Collection Time: 04/16/23  5:07 PM   Specimen: Nasal Mucosa; Nasal Swab  Result Value Ref Range Status   MRSA by PCR Next Gen NOT DETECTED NOT DETECTED Final    Comment: (NOTE) The GeneXpert MRSA Assay (FDA approved for NASAL specimens only), is one component of a comprehensive MRSA colonization surveillance program. It is not intended to diagnose MRSA infection nor to guide or monitor treatment for MRSA infections. Test performance is not FDA approved in patients less than 12 years old. Performed at [facility], [address], [place], [place] [address]      Procedures/Studies: IR Fluoro Guide CV Line Right Result Date: 04/20/2023 CLINICAL DATA:  Renal failure requiring dialysis and need for tunneled hemodialysis catheter. EXAM: TUNNELED CENTRAL VENOUS HEMODIALYSIS CATHETER PLACEMENT WITH ULTRASOUND AND FLUOROSCOPIC GUIDANCE ANESTHESIA/SEDATION: Moderate (conscious) sedation was employed during this procedure. A total of Versed  0.5 mg and Fentanyl  25 mcg was administered intravenously. Moderate Sedation Time: 20 minutes. [name] level of consciousness and vital signs were monitored continuously by radiology nursing throughout the procedure under my direct supervision. MEDICATIONS: 2 g IV Ancef . FLUOROSCOPY: 24 seconds.  5.9 mGy. PROCEDURE: The procedure, risks, benefits, and alternatives were explained to the patient. Questions regarding the procedure were encouraged and answered. The patient understands and consents to the procedure. A timeout was performed prior to initiating the procedure. The right neck and chest were prepped with chlorhexidine  in a sterile fashion, and a sterile drape was applied covering the operative field. Maximum barrier sterile technique with sterile  gowns and gloves were used for the procedure. Local anesthesia was provided with 1% lidocaine . Ultrasound was used to confirm patency of the right internal jugular vein. An ultrasound image was saved and recorded. After creating a small venotomy incision, a 21 gauge needle was advanced into the right internal jugular vein under direct, real-time ultrasound guidance. Ultrasound image documentation was performed. After securing guidewire access, an 8 Fr dilator was placed. A J-wire was kinked to measure appropriate catheter length. A palindrome tunneled hemodialysis catheter measuring 23 cm from tip to cuff was chosen for placement. This was tunneled in a retrograde fashion from the chest wall to the venotomy incision. At the venotomy, serial dilatation was performed and a 15 Fr  peel-away sheath was placed over a guidewire. The catheter was then placed through the sheath and the sheath removed. Final catheter positioning was confirmed and documented with a fluoroscopic spot image. The catheter was aspirated, flushed with saline, and injected with appropriate volume heparin  dwells. The venotomy incision was closed with subcutaneous and subcuticular 4-0 Vicryl. Dermabond was applied to the incision. The catheter exit site was secured with 0-Prolene retention sutures. COMPLICATIONS: None.  No pneumothorax. FINDINGS: After catheter placement, the tip lies in the right atrium. The catheter aspirates normally and is ready for immediate use. IMPRESSION: Placement of tunneled hemodialysis catheter via the right internal jugular vein. The catheter tip lies in the right atrium. The catheter is ready for immediate use. Electronically Signed   By: Marcey Moan M.D.   On: 04/20/2023 08:08   IR US  Guide Vasc Access Right Result Date: 04/20/2023 CLINICAL DATA:  Renal failure requiring dialysis and need for tunneled hemodialysis catheter. EXAM: TUNNELED CENTRAL VENOUS HEMODIALYSIS CATHETER PLACEMENT WITH ULTRASOUND AND FLUOROSCOPIC GUIDANCE ANESTHESIA/SEDATION: Moderate (conscious) sedation was employed during this procedure. A total of Versed  0.5 mg and Fentanyl  25 mcg was administered intravenously. Moderate Sedation Time: 20 minutes. [name] level of consciousness and vital signs were monitored continuously by radiology nursing throughout the procedure under my direct supervision. MEDICATIONS: 2 g IV Ancef . FLUOROSCOPY: 24 seconds.  5.9 mGy. PROCEDURE: The procedure, risks, benefits, and alternatives were explained to the patient. Questions regarding the procedure were encouraged and answered. The patient understands and consents to the procedure. A timeout was performed prior to initiating the procedure. The right neck and chest were prepped with chlorhexidine  in a sterile fashion, and a  sterile drape was applied covering the operative field. Maximum barrier sterile technique with sterile gowns and gloves were used for the procedure. Local anesthesia was provided with 1% lidocaine . Ultrasound was used to confirm patency of the right internal jugular vein. An ultrasound image was saved and recorded. After creating a small venotomy incision, a 21 gauge needle was advanced into the right internal jugular vein under direct, real-time ultrasound guidance. Ultrasound image documentation was performed. After securing guidewire access, an 8 Fr dilator was placed. A J-wire was kinked to measure appropriate catheter length. A palindrome tunneled hemodialysis catheter measuring 23 cm from tip to cuff was chosen for placement. This was tunneled in a retrograde fashion from the chest wall to the venotomy incision. At the venotomy, serial dilatation was performed and a 15 Fr peel-away sheath was placed over a guidewire. The catheter was then placed through the sheath and the sheath removed. Final catheter positioning was confirmed and documented with a fluoroscopic spot image. The catheter was aspirated, flushed with saline, and injected with appropriate volume heparin  dwells. The venotomy incision was closed with subcutaneous  and subcuticular 4-0 Vicryl. Dermabond was applied to the incision. The catheter exit site was secured with 0-Prolene retention sutures. COMPLICATIONS: None.  No pneumothorax. FINDINGS: After catheter placement, the tip lies in the right atrium. The catheter aspirates normally and is ready for immediate use. IMPRESSION: Placement of tunneled hemodialysis catheter via the right internal jugular vein. The catheter tip lies in the right atrium. The catheter is ready for immediate use. Electronically Signed   By: Marcey Moan M.D.   On: 04/20/2023 08:08   VAS US  UPPER EXT VEIN MAPPING (PRE-OP  AVF) Result Date: 04/19/2023 UPPER EXTREMITY VEIN MAPPING Patient Name:  Shirley [name] Hitsman  Date  of Exam:   04/19/2023 Medical Rec #: [id number]         Accession #:    [id number] Date of Birth: 1975-08-10         Patient Gender: F Patient Age:   64 years Exam Location:  [facility] Procedure:      VAS US  UPPER EXT VEIN MAPPING (PRE-OP  AVF) Referring Phys: KATHERYN SABA --------------------------------------------------------------------------------  Indications: Pre-access. Performing Technologist: Edilia Elden Appl  Examination Guidelines: A complete evaluation includes B-mode imaging, spectral Doppler, color Doppler, and power Doppler as needed of all accessible portions of each vessel. Bilateral testing is considered an integral part of a complete examination. Limited examinations for reoccurring indications may be performed as noted. +-----------------+-------------+----------+---------+ Right Cephalic   Diameter (cm)Depth (cm)Findings  +-----------------+-------------+----------+---------+ Shoulder             0.24        1.90             +-----------------+-------------+----------+---------+ Prox upper arm       0.24        1.43             +-----------------+-------------+----------+---------+ Mid upper arm        0.18        0.69   branching +-----------------+-------------+----------+---------+ Dist upper arm       0.22        0.22   Thrombus  +-----------------+-------------+----------+---------+ Antecubital fossa    0.21        0.26   Thrombus  +-----------------+-------------+----------+---------+ Prox forearm         0.15        0.39   branching +-----------------+-------------+----------+---------+ Mid forearm          0.09        0.17             +-----------------+-------------+----------+---------+ Dist forearm         0.10        0.40             +-----------------+-------------+----------+---------+ Wrist                0.07        0.27             +-----------------+-------------+----------+---------+  +-----------------+-------------+----------+--------------+ Right Basilic    Diameter (cm)Depth (cm)   Findings    +-----------------+-------------+----------+--------------+ Mid upper arm        0.37                              +-----------------+-------------+----------+--------------+ Dist upper arm       0.21                              +-----------------+-------------+----------+--------------+  Antecubital fossa    0.12                              +-----------------+-------------+----------+--------------+ Prox forearm                            not visualized +-----------------+-------------+----------+--------------+ Mid forearm                             not visualized +-----------------+-------------+----------+--------------+ Distal forearm                          not visualized +-----------------+-------------+----------+--------------+ Wrist                                   not visualized +-----------------+-------------+----------+--------------+ +-----------------+-------------+----------+-----------------+ Left Cephalic    Diameter (cm)Depth (cm)    Findings      +-----------------+-------------+----------+-----------------+ Shoulder             0.27        2.00                     +-----------------+-------------+----------+-----------------+ Prox upper arm       0.27        1.61                     +-----------------+-------------+----------+-----------------+ Mid upper arm        0.17        0.89       branching     +-----------------+-------------+----------+-----------------+ Dist upper arm       0.18        0.21                     +-----------------+-------------+----------+-----------------+ Antecubital fossa    0.26        0.22       Thrombus      +-----------------+-------------+----------+-----------------+ Prox forearm         0.29        0.31       Thrombus       +-----------------+-------------+----------+-----------------+ Mid forearm          0.28        0.33   Thrombus/catheter +-----------------+-------------+----------+-----------------+ Dist forearm         0.11        0.35                     +-----------------+-------------+----------+-----------------+ Wrist                0.17        0.21                     +-----------------+-------------+----------+-----------------+ +-----------------+-------------+----------+--------------+ Left Basilic     Diameter (cm)Depth (cm)   Findings    +-----------------+-------------+----------+--------------+ Dist upper arm       0.16                              +-----------------+-------------+----------+--------------+ Antecubital fossa                       not visualized +-----------------+-------------+----------+--------------+ Prox forearm  0.11                              +-----------------+-------------+----------+--------------+ Mid forearm                             not visualized +-----------------+-------------+----------+--------------+ Distal forearm       0.07                              +-----------------+-------------+----------+--------------+ Wrist                0.09                              +-----------------+-------------+----------+--------------+ Limited study due to edema, patient's body habitus and inability to properly position. *See table(s) above for measurements and observations.  Diagnosing physician: Fonda Rim Electronically signed by Fonda Rim on 04/19/2023 at 4:21:55 PM.    Final    US  RENAL Result Date: 04/18/2023 CLINICAL DATA:  Acute kidney injury Anuria EXAM: RENAL / URINARY TRACT ULTRASOUND COMPLETE COMPARISON:  08/11/2022 FINDINGS: Right Kidney: Renal measurements: 9.6 x 5.1 x 5.6 cm = volume: 145 mL. Mild increased echogenicity the renal cortex without significant thinning. No mass or hydronephrosis visualized.  Left Kidney: Renal measurements: 10.0 x 4.9 x 5.5 cm = volume: 140 mL. Mild increased echogenicity the renal cortex without significant thinning. No mass or hydronephrosis visualized. Bladder: Appears normal for degree of bladder distention. Other: None. IMPRESSION: Unchanged mild increased echogenicity of the renal cortices without significant thinning, consistent with chronic medical renal disease. No hydronephrosis. Electronically Signed   By: Aliene Lloyd M.D.   On: 04/18/2023 13:36   VAS US  LOWER EXTREMITY VENOUS (DVT) Result Date: 04/18/2023  Lower Venous DVT Study Patient Name:  KAILE BIXLER Olivar  Date of Exam:   04/17/2023 Medical Rec #: [id number]         Accession #:    [id number] Date of Birth: 07-16-1975         Patient Gender: F Patient Age:   17 years Exam Location:  [facility] Procedure:      VAS US  LOWER EXTREMITY VENOUS (DVT) Referring Phys: VINA SIMPSON --------------------------------------------------------------------------------  Indications: Pulmonary embolism.  Risk Factors: Confirmed PE. Comparison Study: Previous study on 11.29.2023. Performing Technologist: Edilia Elden Appl  Examination Guidelines: A complete evaluation includes B-mode imaging, spectral Doppler, color Doppler, and power Doppler as needed of all accessible portions of each vessel. Bilateral testing is considered an integral part of a complete examination. Limited examinations for reoccurring indications may be performed as noted. The reflux portion of the exam is performed with the patient in reverse Trendelenburg.  +---------+---------------+---------+-----------+----------+-----------------+ RIGHT    CompressibilityPhasicitySpontaneityPropertiesThrombus Aging    +---------+---------------+---------+-----------+----------+-----------------+ CFV      Full           Yes      Yes                                    +---------+---------------+---------+-----------+----------+-----------------+ SFJ       Full           Yes      Yes                                    +---------+---------------+---------+-----------+----------+-----------------+  FV Prox  Partial                                                        +---------+---------------+---------+-----------+----------+-----------------+ FV Mid   Full                                                           +---------+---------------+---------+-----------+----------+-----------------+ FV DistalFull                                                           +---------+---------------+---------+-----------+----------+-----------------+ PFV      Full                                                           +---------+---------------+---------+-----------+----------+-----------------+ POP      Partial        Yes      Yes                  Fibrin stranding. +---------+---------------+---------+-----------+----------+-----------------+ PTV      Full                                                           +---------+---------------+---------+-----------+----------+-----------------+ PERO     Full                                                           +---------+---------------+---------+-----------+----------+-----------------+   +---------+---------------+---------+-----------+----------+--------------+ LEFT     CompressibilityPhasicitySpontaneityPropertiesThrombus Aging +---------+---------------+---------+-----------+----------+--------------+ CFV      Full           Yes      Yes                                 +---------+---------------+---------+-----------+----------+--------------+ SFJ      Full           Yes      Yes                                 +---------+---------------+---------+-----------+----------+--------------+ FV Prox  Full                                                        +---------+---------------+---------+-----------+----------+--------------+  FV  Mid   Full                                                        +---------+---------------+---------+-----------+----------+--------------+ FV DistalFull                                                        +---------+---------------+---------+-----------+----------+--------------+ PFV      Full                                                        +---------+---------------+---------+-----------+----------+--------------+ POP      Full           Yes      Yes                                 +---------+---------------+---------+-----------+----------+--------------+ PTV      Full                                                        +---------+---------------+---------+-----------+----------+--------------+ PERO     Full                                                        +---------+---------------+---------+-----------+----------+--------------+     Summary: RIGHT: - Findings consistent with 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  - No cystic structure found in the popliteal fossa.  LEFT: - There is no evidence of deep vein thrombosis in the lower extremity.  - No cystic structure found in the popliteal fossa.  *See table(s) above for measurements and observations. Electronically signed by Fonda Rim on 04/18/2023 at 11:53:52 AM.    Final    CT HEAD WO CONTRAST ( ) Result Date: 04/16/2023 CLINICAL DATA:  Mental status change, unknown cause EXAM: CT HEAD WITHOUT CONTRAST TECHNIQUE: Contiguous axial images were obtained from the base of the skull through the vertex without intravenous contrast. RADIATION DOSE REDUCTION: This exam was performed according to the departmental dose-optimization program which includes automated exposure control, adjustment of the mA and/or kV according to patient size and/or use of iterative reconstruction technique. COMPARISON:  CT head 01/09/2016. FINDINGS: Brain: Remote right cerebellar infarct. Small  remote right caudate lacunar infarct. No evidence of acute large vascular territory infarct, acute hemorrhage, mass lesion, midline shift or hydrocephalus. Vascular: No hyperdense vessel. Skull: Normal. Negative for fracture or focal lesion. Sinuses/Orbits: No acute finding. IMPRESSION: No evidence of acute intracranial abnormality. Electronically Signed   By: Gilmore GORMAN Molt M.D.   On: 04/16/2023 18:25   DG CHEST PORT 1 VIEW Result Date: 04/16/2023 CLINICAL DATA:  Shock. History of diabetes, gastroesophageal reflux disease, and hypertension. EXAM: PORTABLE CHEST 1 VIEW COMPARISON:  04/15/2023 FINDINGS: Shallow inspiration. Cardiac enlargement. No vascular congestion, edema, or consolidation. No pleural effusions. No pneumothorax. Mediastinal contours appear intact. Degenerative changes in the spine. IMPRESSION: Cardiac enlargement.  No evidence of active pulmonary disease. Electronically Signed   By: Elsie Gravely M.D.   On: 04/16/2023 17:21   NM Pulmonary Perfusion Result Date: 04/16/2023 CLINICAL DATA:  Pulmonary embolism suspected, high probability. Shortness of breath. History of chronic kidney disease. EXAM: NUCLEAR MEDICINE PERFUSION LUNG SCAN TECHNIQUE: Perfusion images were obtained in multiple projections after intravenous injection of radiopharmaceutical. Ventilation scans intentionally deferred if perfusion scan and chest x-ray adequate for interpretation since COVID 19 epidemic. RADIOPHARMACEUTICALS:  4.4 mCi Tc-76m MAA IV COMPARISON:  Chest radiographs 04/15/2023 and 08/07/2022. Nuclear medicine perfusion lung scan 03/21/2022 and 11/25/2019. FINDINGS: There is improved perfusion to the superior segment of the right lower lobe compared with the previous study. Patchy perfusion to both lungs consistent with chronic obstructive disease and small pleural effusions. There are no wedge-shaped perfusion defects to suggest pulmonary embolism. IMPRESSION: No evidence of acute pulmonary embolism on  perfusion scintigraphy by PISAPED criteria. Consider correlation with lower extremity venous Doppler ultrasound. Electronically Signed   By: Elsie Perone M.D.   On: 04/16/2023 09:34   DG Chest 2 View Result Date: 04/15/2023 CLINICAL DATA:  Shortness of breath EXAM: CHEST - 2 VIEW COMPARISON:  None Available. FINDINGS: Heart is upper limits normal in size. Mediastinal contours within normal limits. No confluent airspace opacities, effusions or edema. No acute bony abnormality. IMPRESSION: No active cardiopulmonary disease. Electronically Signed   By: Franky Crease M.D.   On: 04/15/2023 20:49     Time coordinating discharge: Over 30 minutes    Alm Apo, MD  [facility] 04/26/2023, 3:04 PM

## 2023-04-26 NOTE — TOC Transition Note (Signed)
 Transition of Care [facility]) - Discharge Note   Patient Details  Name: Shirley [name] MRN: [id number] Date of Birth: [birth year]  Transition of Care [facility] CM/SW Contact:  Rosaline JONELLE Joe, RN Phone Number: 04/26/2023, 11:56 AM   Clinical Narrative:    CM spoke with attending physician and patient will be discharged home today with daughter.  Home health has been set up.  RW and 3:1 are present at the bedside for patient to take home.  Patient and daughter normally drive and plan to provide transportation to HD appointments.  Resources also included in the AVS.  No other TOC needs and bedside nursing will discharge the patient home with family when ready today.         Patient Goals and CMS Choice            Discharge Placement                       Discharge Plan and Services Additional resources added to the After Visit Summary for                                       Social Drivers of Health (SDOH) Interventions SDOH Screenings   Food Insecurity: No Food Insecurity (04/16/2023)  Housing: Patient Unable To Answer (04/16/2023)  Transportation Needs: Patient Unable To Answer (04/16/2023)  Utilities: Patient Unable To Answer (04/16/2023)  Alcohol Screen: Low Risk  (08/09/2022)  Depression (PHQ2-9): Medium Risk (11/28/2018)  Financial Resource Strain: Low Risk  (08/09/2022)  Tobacco Use: Medium Risk (04/19/2023)     Readmission Risk Interventions    04/24/2023   12:19 PM 08/15/2022    2:04 PM 05/23/2022   11:50 AM  Readmission Risk Prevention Plan  Transportation Screening Complete Complete Complete  Medication Review Oceanographer) Complete Complete Complete  PCP or Specialist appointment within 3-5 days of discharge Complete Complete Complete  HRI or Home Care Consult Complete Complete Complete  SW Recovery Care/Counseling Consult Complete Complete --  Palliative Care Screening Not Applicable Not Applicable Not Applicable  Skilled  Nursing Facility Patient Refused Not Applicable Not Applicable

## 2023-04-26 NOTE — Discharge Planning (Signed)
 [facility]  Initial Hemodialysis Orders  Dialysis center: [facility]  Patient's name: Shirley [name] DOB: 08-Mar-1976 AKI or ESRD: ESRD  Discharge diagnosis: New ESRD 2.Severe sepsis, influenza A  Allergies:  Allergies  Allergen Reactions   Morphine  And Codeine  Itching and Swelling   Zestril [Lisinopril] Nausea Only and Swelling    Date of First Dialysis: 04/20/23 Cause of renal disease: daibetes mellitus  Dialysis Prescription: Dialysis Frequency: TIW Tx duration: 4 hours  BFR: 400 DFR: 800  EDW: 95kg  Dialyzer: 180NRe UF profile/Sodium modeling?: no Dialysis Bath: 2 K 2 Ca  Dialysis access: Access type: [facility] Date placed: 04/19/23  Surgeon: [facility] IR Needle gauge: N/A  In Center Medications: Heparin  Dose: none  Type: N/A VDRA: per protocol Venofer : none- given Fe bolus in the hospital Mircera: 50mcg IV q 2 weeks Next dose due: Next HD Sensipar: none  Discharge labs: Hgb: 9.2 K+: 4.2  Ca: 8.1 Phos: 5.1 Alb: 2.2  Please draw routine labs. Additional labs needed: none Additional notes/follow-up: will need referral for permanent access  Lucie Collet, PA-C 04/26/2023, 11:54 AM  [facility] Pager: [phone]

## 2023-04-27 ENCOUNTER — Telehealth: Payer: Self-pay | Admitting: Nephrology

## 2023-04-27 NOTE — Telephone Encounter (Signed)
 Transition of care contact from inpatient facility  Date of Discharge: 04/26/23 Date of Contact: 04/27/23 Method of contact: Phone  Attempted to contact patient to discuss transition of care from inpatient admission. Patient did not answer the phone. Unable to leave voicemail.

## 2023-04-28 ENCOUNTER — Telehealth: Payer: Self-pay | Admitting: Nephrology

## 2023-04-28 NOTE — Telephone Encounter (Signed)
 Transition of Care - Initial Contact from Inpatient Facility  Date of discharge: 04/26/23 Date of contact: 04/28/23 Method: Phone Spoke to: Patient  Patient contacted to discuss transition of care from recent inpatient hospitalization. Patient was admitted to [facility] from 04/15/23 - 04/26/23 with discharge diagnosis of progressive CKD and sepsis secondary to influenza.   The discharge medication list was reviewed. Patient understands the changes and has no concerns.   Patient will return to his/her outpatient HD unit on: Her next dialysis will be on Monday.   No other concerns at this time.

## 2023-05-03 ENCOUNTER — Other Ambulatory Visit: Payer: Self-pay | Admitting: Internal Medicine

## 2023-05-03 DIAGNOSIS — R339 Retention of urine, unspecified: Secondary | ICD-10-CM

## 2023-05-08 ENCOUNTER — Other Ambulatory Visit: Payer: 59

## 2023-05-10 ENCOUNTER — Ambulatory Visit
Admission: RE | Admit: 2023-05-10 | Discharge: 2023-05-10 | Disposition: A | Discharge status: Home / Self Care | Payer: 59 | Source: Ambulatory Visit | Attending: Internal Medicine | Admitting: Internal Medicine

## 2023-05-10 DIAGNOSIS — R339 Retention of urine, unspecified: Secondary | ICD-10-CM

## 2023-07-04 ENCOUNTER — Other Ambulatory Visit (HOSPITAL_COMMUNITY): Payer: Self-pay

## 2023-07-04 ENCOUNTER — Telehealth: Payer: Self-pay

## 2023-07-04 NOTE — Telephone Encounter (Signed)
 Pharmacy Patient Advocate Encounter  Insurance verification completed.   The patient is insured through [employer] claim for [employer]. Currently a quantity of 30 is a 30 day supply and the co-pay is $0.00 .   This test claim was processed through [facility]- copay amounts may vary at other pharmacies due to pharmacy/plan contracts, or as the patient moves through the different stages of their insurance plan.

## 2023-07-12 ENCOUNTER — Ambulatory Visit: Admitting: Internal Medicine

## 2023-07-27 ENCOUNTER — Encounter: Payer: Self-pay | Admitting: Vascular Surgery

## 2023-07-27 ENCOUNTER — Telehealth: Payer: Self-pay

## 2023-07-27 ENCOUNTER — Ambulatory Visit: Admitting: Vascular Surgery

## 2023-07-27 VITALS — BP 174/98 | HR 94 | Temp 98.0°F | Resp 18 | Ht 67.0 in | Wt 226.7 lb

## 2023-07-27 DIAGNOSIS — N186 End stage renal disease: Secondary | ICD-10-CM | POA: Diagnosis not present

## 2023-07-27 DIAGNOSIS — Z992 Dependence on renal dialysis: Secondary | ICD-10-CM | POA: Diagnosis not present

## 2023-07-27 NOTE — Progress Notes (Signed)
 Patient name: Shirley [name] MRN: [id number] DOB: [birth year] Sex: female  REASON FOR CONSULT: New dilaysis access creation  HPI: Ely Spragg Trieu is a 48 y.o. female, with history of ESRD, diabetes, CAD, hypertension, hyperlipidemia, DVT on Eliquis that presents for evaluation of new dialysis access.  Patient states she has been on dialysis for 1 month.  She has a right IJ TDC.  She is right-handed.  No prior access in the past.  Past Medical History:  Diagnosis Date   Abscess of tunica vaginalis    10/09- Abundant S. aureus- sensitive to all abx   Anxiety    Blood dyscrasia    CAD (coronary artery disease) 06/15/2006   s/p Subendocardial MI with PDA angioplasty(no stent) on 06/15/06 and relook  cath 06/19/06 showed patency of site. Cath 12/10- no restenosis or significant CAD progression   CVA (cerebral vascular accident) (HCC) ~ 02/2014   denies residual on 04/22/2014   CVA (cerebral vascular accident) [facility])    history of remote right cerebellar infarct noted on head CT at least since 10/2011   Depression    Diabetes mellitus type 2, uncontrolled, with complications    Fibromyalgia    Gastritis    Gastroparesis    secondary to poorly controlled DM, last emptying study performed 01/2010  was normal but may be falsely positive as pt was on reglan   GERD (gastroesophageal reflux disease)    Hepatitis B, chronic (HCC)    Hep BeAb+,Hep B cAb+ & Hep BsAg+ (9/06)   History of pyelonephritis    H/o GrpB Pyelonephritis (9/06) and UTI- 07/11- E.Coli, 12/10- GBS   Hyperlipidemia    Hypertension    Iron deficiency anemia    Irregular menses    Small ovarian follicles seen on CT(9/06)   MI (myocardial infarction) (HCC) 05/2006   PDA percutaneous transluminal coronary angioplasty   Migraine    "weekly" (04/22/2014)   N&V (nausea and vomiting)    Chronic. Unclear etiology with multiple admission and ED visits. CT abdomen with and without contrast (02/2011)  showed no acute process.  Gastic Emptying scan (01/2010) was normal. Ultrasound of the abdomen was within normal limits. Hepatitis B viral load was undectable. HIV NR. EGD - gastritis, Hpylori + s/p Rx   New onset of congestive heart failure (HCC) 05/16/2022   Obesity    OSA (obstructive sleep apnea)    "suppose to wear mask but I don't" (04/22/2014)   Peripheral neuropathy    Pneumonia    "this is probably the 2nd or 3rd time I've had pneumonia" (04/22/2014)   Recurrent boils    Seasonal asthma    Substance abuse (HCC)    Thrombocytosis    Hem/Onc suggested 2/2 chronic hepatits and/or iron deficiency anemia    Past Surgical History:  Procedure Laterality Date   CESAREAN SECTION  1997   CORONARY ANGIOPLASTY WITH STENT PLACEMENT  2008   "2 stents"   ESOPHAGOGASTRODUODENOSCOPY N/A 04/23/2014   Procedure: ESOPHAGOGASTRODUODENOSCOPY (EGD);  Surgeon: Vertell Novak., MD;  Location: [facility] ENDOSCOPY;  Service: Endoscopy;  Laterality: N/A;   INCISION AND DRAINAGE ABSCESS Right 04/17/2021   Procedure: INCISION AND DRAINAGE CHEST WALL ABSCESS;  Surgeon: Diamantina Monks, MD;  Location: [facility] OR;  Service: General;  Laterality: Right;   IR ANGIOGRAM PELVIS SELECTIVE OR SUPRASELECTIVE  12/07/2016   IR ANGIOGRAM PELVIS SELECTIVE OR SUPRASELECTIVE  12/07/2016   IR ANGIOGRAM SELECTIVE EACH ADDITIONAL VESSEL  12/07/2016   IR ANGIOGRAM SELECTIVE EACH ADDITIONAL VESSEL  12/07/2016   IR EMBO ARTERIAL NOT HEMORR HEMANG INC GUIDE ROADMAPPING  12/07/2016   IR FLUORO GUIDE CV LINE RIGHT  04/19/2023   IR RADIOLOGIST EVAL & MGMT  01/09/2017   IR US GUIDE VASC ACCESS RIGHT  12/07/2016   IR US GUIDE VASC ACCESS RIGHT  04/19/2023   IRRIGATION AND DEBRIDEMENT FOOT Right 04/07/2020   Procedure: Incision and Drainage Right Foot;  Surgeon: Park Liter, DPM;  Location: [facility] ORS;  Service: Podiatry;  Laterality: Right;   RIGHT HEART CATH N/A 08/14/2022   Procedure: RIGHT HEART CATH;  Surgeon: Runell Gess, MD;  Location: [facility] INVASIVE CV LAB;   Service: Cardiovascular;  Laterality: N/A;    Family History  Problem Relation Age of Onset   Diabetes Father    Healthy Mother     SOCIAL HISTORY: Social History   Socioeconomic History   Marital status: Single    Spouse name: Not on file   Number of children: 2   Years of education: 11   Highest education level: Not on file  Occupational History   Occupation: other    Comment: unemployed    Comment: disability  Tobacco Use   Smoking status: Former    Current packs/day: 0.00    Types: Cigarettes    Quit date: 04/24/1996    Years since quitting: 27.2   Smokeless tobacco: Never   Tobacco comments:    quit smoking cigarettes age 44  Vaping Use   Vaping status: Never Used  Substance and Sexual Activity   Alcohol use: No    Alcohol/week: 0.0 standard drinks of alcohol    Comment: 04/22/2014 "might have a few drinks a month"   Drug use: Not Currently    Types: Marijuana, Cocaine    Comment: 04/22/2104 "quit drugs ~ 1-2 yr ago"   Sexual activity: Yes    Birth control/protection: None  Other Topics Concern   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         Social Drivers of Corporate investment banker Strain: Low Risk  (08/09/2022)   Overall Financial Resource Strain (CARDIA)    Difficulty of Paying Living Expenses: Not hard at all  Food Insecurity: No Food Insecurity (04/16/2023)   Hunger Vital Sign    Worried About Running Out of Food in the Last Year: Never true    Ran Out of Food in the Last Year: Never true  Transportation Needs: Patient Unable To Answer (04/16/2023)   PRAPARE - Transportation    Lack of Transportation (Medical): Patient unable to answer    Lack of Transportation (Non-Medical): Patient unable to answer  Physical Activity: Not on file  Stress: Not on file  Social Connections: Not on file   Intimate Partner Violence: Patient Unable To Answer (04/16/2023)   Humiliation, Afraid, Rape, and Kick questionnaire    Fear of Current or Ex-Partner: Patient unable to answer    Emotionally Abused: Patient unable to answer    Physically Abused: Patient unable to answer    Sexually Abused: Patient unable to answer    Allergies  Allergen Reactions   Morphine And Codeine Itching and Swelling   Zestril [Lisinopril] Nausea Only and Swelling    Current Outpatient Medications  Medication Sig Dispense Refill   albuterol (VENTOLIN HFA) 108 (90 Base) MCG/ACT inhaler Inhale 2  puffs into the lungs every 4 (four) hours as needed for wheezing or shortness of breath. 8 g 1   amLODipine (NORVASC) 5 MG tablet Take 1 tablet (5 mg total) by mouth daily. 30 tablet 3   apixaban (ELIQUIS) 5 MG TABS tablet Take 1 tablet (5 mg total) by mouth 2 (two) times daily. 60 tablet 3   aspirin 81 MG chewable tablet Chew 81 mg by mouth daily.     Blood Glucose Monitoring Suppl (ONETOUCH VERIO FLEX SYSTEM) w/Device KIT Use in the morning, at noon, and at bedtime. 1 kit 0   calcium acetate (PHOSLO) 667 MG capsule Take 1 capsule (667 mg total) by mouth 3 (three) times daily with meals. 90 capsule 3   cyclobenzaprine (FLEXERIL) 10 MG tablet Take 10 mg by mouth 3 (three) times daily as needed for muscle spasms.     diphenhydrAMINE (BENADRYL) 25 MG tablet Take 25 mg by mouth every 6 (six) hours as needed for allergies.     ferrous sulfate 325 (65 FE) MG tablet Take 325 mg by mouth daily with breakfast.     glucose blood (ONETOUCH VERIO) test strip Use as instructed to check blood sugar 3 times daily. Dx codes: E11.8, E11.65 100 each 11   hydrALAZINE (APRESOLINE) 100 MG tablet Take 1 tablet (100 mg total) by mouth every 8 (eight) hours. 180 tablet 3   Insulin Pen Needle (PEN NEEDLES 3/16") 31G X 5 MM MISC 1 Device by Does not apply route 4 (four) times daily - after meals and at bedtime. 100 each 5   INSULIN SYRINGE .5CC/28G  (INS SYRINGE/NEEDLE .5CC/28G) 28G X 1/2" 0.5 ML MISC Please provide 1 month supply 100 each 0   isosorbide mononitrate (IMDUR) 30 MG 24 hr tablet Take 3 tablets (90 mg total) by mouth daily. 30 tablet 3   linaclotide (LINZESS) 145 MCG CAPS capsule Take 145 mcg by mouth daily as needed (for constipation).     meclizine (ANTIVERT) 25 MG tablet Take 1 tablet (25 mg total) by mouth 3 (three) times daily as needed for dizziness. 30 tablet 0   omeprazole (PRILOSEC) 40 MG capsule Take 40 mg by mouth daily before breakfast.     OneTouch Delica Lancets 33G MISC Use to check blood sugar 3 times daily. Dx codes: E11.8, E11.65 100 each 11   oxyCODONE-acetaminophen (PERCOCET) 10-325 MG tablet Take 1 tablet by mouth 3 (three) times daily.     senna (SENOKOT) 8.6 MG TABS tablet Take 1 tablet (8.6 mg total) by mouth 2 (two) times daily. 30 tablet 0   atorvastatin (LIPITOR) 40 MG tablet Take 1 tablet (40 mg total) by mouth daily. (Patient not taking: Reported on 04/20/2023) 90 tablet 3   carvedilol (COREG) 25 MG tablet Take 1 tablet (25 mg total) by mouth 2 (two) times daily with a meal. (Patient not taking: Reported on 04/20/2023) 180 tablet 3   No current facility-administered medications for this visit.    REVIEW OF SYSTEMS:  [X]  denotes positive finding, [ ]  denotes negative finding Cardiac  Comments:  Chest pain or chest pressure:    Shortness of breath upon exertion:    Short of breath when lying flat:    Irregular heart rhythm:        Vascular    Pain in calf, thigh, or hip brought on by ambulation:    Pain in feet at night that wakes you up from your sleep:     Blood clot in your veins:  Leg swelling:         Pulmonary    Oxygen at home:    Productive cough:     Wheezing:         Neurologic    Sudden weakness in arms or legs:     Sudden numbness in arms or legs:     Sudden onset of difficulty speaking or slurred speech:    Temporary loss of vision in one eye:     Problems with  dizziness:         Gastrointestinal    Blood in stool:     Vomited blood:         Genitourinary    Burning when urinating:     Blood in urine:        Psychiatric    Major depression:         Hematologic    Bleeding problems:    Problems with blood clotting too easily:        Skin    Rashes or ulcers:        Constitutional    Fever or chills:      PHYSICAL EXAM: Vitals:   07/27/23 0951  BP: (!) 174/98  Pulse: 94  Resp: 18  Temp: 98 F (36.7 C)  TempSrc: Temporal  SpO2: 100%  Weight: 226 lb 11.2 oz (102.8 kg)  Height: 5\' 7"  (1.702 m)    GENERAL: The patient is a well-nourished female, in no acute distress. The vital signs are documented above. CARDIAC: There is a regular rate and rhythm.  VASCULAR:  Bilateral radial brachial pulses palpable Right IJ TDC PULMONARY: No respiratory distress. ABDOMEN: Soft and non-tender. MUSCULOSKELETAL: There are no major deformities or cyanosis. NEUROLOGIC: No focal weakness or paresthesias are detected. SKIN: There are no ulcers or rashes noted. PSYCHIATRIC: The patient has a normal affect.  DATA:   UPPER EXTREMITY VEIN MAPPING  Patient Name:  ELEASE SWARM  Date of Exam:   04/19/2023 Medical Rec #: [id number]         Accession #:    [id number] Date of Birth: 16-Jan-1976         Patient Gender: F Patient Age:   61 years Exam Location:  [facility] Procedure:      VAS [place] UPPER EXT VEIN MAPPING (PRE-OP AVF) Referring Phys: Lawson Fiscal FOSTER   --------------------------------------------------------------------------- -----   Indications: Pre-access.  Performing Technologist: Fernande Bras    Examination Guidelines: A complete evaluation includes B-mode imaging, spectral Doppler, color Doppler, and power Doppler as needed of all accessible portions of each vessel. Bilateral testing is considered an integral part of a complete examination. Limited examinations for reoccurring indications may  be performed as noted.  +-----------------+-------------+----------+---------+ Right Cephalic   Diameter (cm)Depth (cm)Findings  +-----------------+-------------+----------+---------+ Shoulder             0.24        1.90             +-----------------+-------------+----------+---------+ Prox upper arm       0.24        1.43             +-----------------+-------------+----------+---------+ Mid upper arm        0.18        0.69   branching +-----------------+-------------+----------+---------+ Dist upper arm       0.22        0.22   Thrombus  +-----------------+-------------+----------+---------+ Antecubital fossa    0.21  0.26   Thrombus  +-----------------+-------------+----------+---------+ Prox forearm         0.15        0.39   branching +-----------------+-------------+----------+---------+ Mid forearm          0.09        0.17             +-----------------+-------------+----------+---------+ Dist forearm         0.10        0.40             +-----------------+-------------+----------+---------+ Wrist                0.07        0.27             +-----------------+-------------+----------+---------+  +-----------------+-------------+----------+--------------+ Right Basilic    Diameter (cm)Depth (cm)   Findings    +-----------------+-------------+----------+--------------+ Mid upper arm        0.37                              +-----------------+-------------+----------+--------------+ Dist upper arm       0.21                              +-----------------+-------------+----------+--------------+ Antecubital fossa    0.12                              +-----------------+-------------+----------+--------------+ Prox forearm                            not visualized +-----------------+-------------+----------+--------------+ Mid forearm                             not  visualized +-----------------+-------------+----------+--------------+ Distal forearm                          not visualized +-----------------+-------------+----------+--------------+ Wrist                                   not visualized +-----------------+-------------+----------+--------------+  +-----------------+-------------+----------+-----------------+ Left Cephalic    Diameter (cm)Depth (cm)    Findings      +-----------------+-------------+----------+-----------------+ Shoulder             0.27        2.00                     +-----------------+-------------+----------+-----------------+ Prox upper arm       0.27        1.61                     +-----------------+-------------+----------+-----------------+ Mid upper arm        0.17        0.89       branching     +-----------------+-------------+----------+-----------------+ Dist upper arm       0.18        0.21                     +-----------------+-------------+----------+-----------------+ Antecubital fossa    0.26        0.22       Thrombus      +-----------------+-------------+----------+-----------------+ Prox  forearm         0.29        0.31       Thrombus      +-----------------+-------------+----------+-----------------+ Mid forearm          0.28        0.33   Thrombus/catheter +-----------------+-------------+----------+-----------------+ Dist forearm         0.11        0.35                     +-----------------+-------------+----------+-----------------+ Wrist                0.17        0.21                     +-----------------+-------------+----------+-----------------+  +-----------------+-------------+----------+--------------+ Left Basilic     Diameter (cm)Depth (cm)   Findings    +-----------------+-------------+----------+--------------+ Dist upper arm       0.16                               +-----------------+-------------+----------+--------------+ Antecubital fossa                       not visualized +-----------------+-------------+----------+--------------+ Prox forearm         0.11                              +-----------------+-------------+----------+--------------+ Mid forearm                             not visualized +-----------------+-------------+----------+--------------+ Distal forearm       0.07                              +-----------------+-------------+----------+--------------+ Wrist                0.09                              +-----------------+-------------+----------+--------------+  Limited study due to edema, patient's body habitus and inability to properly position. *See table(s) above for measurements and observations.     Diagnosing physician: Gerarda Fraction Electronically signed by Gerarda Fraction on 04/19/2023 at 4:21:55 PM.    Assessment/Plan:  48 y.o. female, with history of ESRD, diabetes, CAD, hypertension, hyperlipidemia, DVT on Eliquis that presents for evaluation of new dialysis access.  I discussed access in the nondominant arm which would be her left arm.  Her vein mapping in December showed small basilic veins bilaterally with thrombus in both cephalic veins.  I discussed high risk for AV graft as she does not appear to have good surface vein.  Will evaluate left arm with ultrasound in the OR to ensure there is no vein that is usable.  I discussed to be placing graft this takes 1 month to mature.  Discussed this being done as an outpatient at [facility].  Will need to hold her Eliquis.  Risk-benefit discussed including failure to mature and risk of steal.   Cephus Shelling, MD [facility] Office: [phone]

## 2023-07-27 NOTE — Telephone Encounter (Signed)
 Attempted to call for surgery scheduling. LVM

## 2023-07-30 ENCOUNTER — Other Ambulatory Visit: Payer: Self-pay

## 2023-07-30 ENCOUNTER — Telehealth: Payer: Self-pay

## 2023-07-30 DIAGNOSIS — N186 End stage renal disease: Secondary | ICD-10-CM

## 2023-07-30 NOTE — Telephone Encounter (Signed)
 Attempted to call for surgery scheduling. LVM

## 2023-07-31 ENCOUNTER — Encounter (HOSPITAL_COMMUNITY): Payer: Self-pay | Admitting: Vascular Surgery

## 2023-07-31 NOTE — Anesthesia Preprocedure Evaluation (Signed)
 Anesthesia Evaluation  Patient identified by MRN, date of birth, ID band Patient awake    Reviewed: Allergy & Precautions, NPO status , Patient's Chart, lab work & pertinent test results  Airway Mallampati: II  TM Distance: >3 FB Neck ROM: Full    Dental  (+) Dental Advisory Given   Pulmonary asthma , sleep apnea , former smoker   breath sounds clear to auscultation       Cardiovascular hypertension, Pt. on medications + CAD, +CHF and + DVT   Rhythm:Regular Rate:Normal     Neuro/Psych  Headaches  Neuromuscular disease CVA    GI/Hepatic ,GERD  ,,(+) Hepatitis -  Endo/Other  diabetes, Type 2, Insulin Dependent, Oral Hypoglycemic Agents    Renal/GU ESRF and DialysisRenal disease     Musculoskeletal   Abdominal   Peds  Hematology  (+) Blood dyscrasia (Last Eliquis on 4/7), anemia   Anesthesia Other Findings   Reproductive/Obstetrics                             Anesthesia Physical Anesthesia Plan  ASA: 4  Anesthesia Plan: General   Post-op Pain Management: Tylenol PO (pre-op)*   Induction: Intravenous  PONV Risk Score and Plan: 3 and Dexamethasone, Ondansetron, Midazolam and Treatment may vary due to age or medical condition  Airway Management Planned: LMA  Additional Equipment:   Intra-op Plan:   Post-operative Plan: Extubation in OR  Informed Consent: I have reviewed the patients History and Physical, chart, labs and discussed the procedure including the risks, benefits and alternatives for the proposed anesthesia with the patient or authorized representative who has indicated his/her understanding and acceptance.     Dental advisory given  Plan Discussed with: CRNA  Anesthesia Plan Comments: (  )       Anesthesia Quick Evaluation

## 2023-07-31 NOTE — Progress Notes (Signed)
 Anesthesia Chart Review: SAME DAY WORK-UP  Case: [id number] Date/Time: 08/01/23 1205   Procedures:      ARTERIOVENOUS (AV) FISTULA CREATION (Left)     INSERTION, GRAFT, ARTERIOVENOUS, UPPER EXTREMITY (Left)   Anesthesia type: General   Diagnosis: End stage renal disease (HCC) [N18.6]   Pre-op diagnosis: ESRD   [facility]OM 12 / [facility]   Surgeons: Shirley Shelling, MD       DISCUSSION: Patient is a 48 year old female scheduled for the above procedure. As of 07/27/23 she was using right internal jugular TDC for HD.  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 She has primarily been seen by cardiology during admission on in their post-hospitalization transitions of care clinic. Volume status stable at last visit. HTN poorly controlled and advised follow-up in the HTN Clinic. Suspected untreated OSA may be contributing, as well as CKD. Prior MI in 2008 s/p PTCA RCA (unclear if cocaine was a contributing factor then, but she does have prior use history).  Repeat LHC in 2010 showed normal coronaries. LVEF 45-50% in 02/2022. At last visit, she was continued on Coreg, hydralazine, Imdur. Follow-up as needed.   Anesthesia team to evaluate on the day of surgery.  Dr. Chestine [name] advised she hold Eliquis for procedure, posting suggested she was not currently taking. PAT RN preoperative phone call is still pending..    VS: LMP 01/22/2021 Comment: neg preg test 04/17/21. BP Readings from Last 3 Encounters:  07/27/23 (!) 174/98  04/26/23 109/63  12/28/22 (!) 165/103   Pulse Readings from Last 3 Encounters:  07/27/23 94  04/26/23 83  12/28/22 85      PROVIDERS: Shirley Costa, NP is listed as PCP  Shirley Sable, MD is nephrologist Nahser, Loistine Chance, MD is listed as cardiologist, although has primarily been seen in-patient or at transitions of care clinic.   LABS: Most recent results in [facility] include: Lab Results  Component Value Date   WBC 16.1 (H) 04/26/2023   HGB 9.2 (L) 04/26/2023   HCT 26.9 (L) 04/26/2023   PLT 323 04/26/2023   GLUCOSE 102 (H) 04/23/2023   CHOL 169 07/21/2013   TRIG 100 07/21/2013   HDL 41 07/21/2013   LDLCALC 108 (H) 07/21/2013   ALT 11 04/16/2023   AST 26 04/16/2023   NA 136 04/23/2023   K 4.2 04/23/2023   CL 105 04/23/2023   CREATININE 6.46 (H) 04/23/2023   BUN 53 (H) 04/23/2023   CO2 22 04/23/2023   TSH 1.989 08/10/2022   INR 1.1 04/16/2021   HGBA1C 6.5 (H) 04/16/2023   MICROALBUR 1,401.4 (H) 08/12/2022     IMAGES: CT Head 04/16/23: IMPRESSION: No evidence of acute intracranial abnormality.   1V PCXR 04/16/23: FINDINGS: Shallow inspiration. Cardiac enlargement. No vascular congestion, edema, or consolidation. No pleural effusions. No pneumothorax. Mediastinal contours appear intact. Degenerative changes in the spine.  IMPRESSION: Cardiac enlargement.  No evidence of active pulmonary disease.   EKG: 04/15/23: ST at 107 bpm. PVC. Probable LAE. Low voltage. Consider anterior infarct. Non-specific lateral T wave abnormalities.    CV: LE Venous US 04/17/23: Summary:  RIGHT:  - Findings consistent with chronic deep vein thrombosis involving the  right popliteal  vein.  - No cystic structure found in the popliteal fossa.    LEFT:  - There is no evidence of deep vein thrombosis in the lower extremity.  -  No cystic structure found in the popliteal fossa.    RHC 08/14/22: HEMODYNAMICS:  1: Right atrial pressure-15/14, mean 10 2: Right ventricular pressure-47/13 3: Pulmonary pressure-50/17, mean 33 4: Pulmonary wedge pressure-A-wave 24, V wave 26, mean 16 5: Cardiac  output-9.9 L/min with an index of 4.7 L/min/m   IMPRESSION: Shirley [name] has a history of remote CAD and moderate LV dysfunction with chronic renal insufficiency.  She was admitted with symptoms of congestive heart failure.  She was referred for right heart cath to determine her volume status.  Her right atrial pressure was mildly elevated.  Her wedge pressure is mildly elevated.  She has mild to moderate pulm hypertension.  I suspect she remains mildly volume overloaded based on these numbers.  The sheath was removed and a pressure dressing was placed on the right antecubital fossa.  The patient left lab in stable condition.    Echo 03/21/22: IMPRESSIONS   1. Left ventricular ejection fraction, by estimation, is 45 to 50%. The  left ventricle has mildly decreased function. The left ventricle  demonstrates global hypokinesis. There is mild concentric left ventricular  hypertrophy. Indeterminate diastolic  filling due to E-A fusion.   2. Right ventricular systolic function is normal. The right ventricular  size is normal. There is mildly elevated pulmonary artery systolic  pressure. The estimated right ventricular systolic pressure is 35.9 mmHg.   3. Left atrial size was moderately dilated.   4. A small pericardial effusion is present. The pericardial effusion is  circumferential.   5. The mitral valve is normal in structure. Mild to moderate mitral valve  regurgitation.   6. Tricuspid valve regurgitation is mild to moderate.   7. The aortic valve is tricuspid. Aortic valve regurgitation is not  visualized. No aortic stenosis is present.    LHC 04/08/09: Summary: 1.  Normal coronary arteries without hemodynamic significant lesion. 2.  Left ventriculography showed normal left ventricular function. 3.  No evidence of significant valve disease.  Past Medical History:  Diagnosis Date   Abscess of tunica vaginalis    10/09- Abundant S. aureus- sensitive to all abx   Anxiety    Blood  dyscrasia    CAD (coronary artery disease) 06/15/2006   s/p Subendocardial MI with PDA angioplasty(no stent) on 06/15/06 and relook  cath 06/19/06 showed patency of site. Cath 12/10- no restenosis or significant CAD progression   CVA (cerebral vascular accident) (HCC) ~ 02/2014   denies residual on 04/22/2014   CVA (cerebral vascular accident) [facility])    history of remote right cerebellar infarct noted on head CT at least since 10/2011   Depression    Diabetes mellitus type 2, uncontrolled, with complications    Fibromyalgia    Gastritis    Gastroparesis    secondary to poorly controlled DM, last emptying study performed 01/2010  was normal but may be falsely positive as pt was on reglan   GERD (gastroesophageal reflux disease)    Hepatitis B, chronic (HCC)    Hep BeAb+,Hep B cAb+ & Hep BsAg+ (9/06)   History of pyelonephritis    H/o GrpB Pyelonephritis (9/06) and UTI- 07/11- E.Coli, 12/10- GBS   Hyperlipidemia    Hypertension    Iron deficiency anemia    Irregular menses    Small ovarian follicles seen on CT(9/06)  MI (myocardial infarction) (HCC) 05/2006   PDA percutaneous transluminal coronary angioplasty   Migraine    "weekly" (04/22/2014)   N&V (nausea and vomiting)    Chronic. Unclear etiology with multiple admission and ED visits. CT abdomen with and without contrast (02/2011)  showed no acute process. Gastic Emptying scan (01/2010) was normal. Ultrasound of the abdomen was within normal limits. Hepatitis B viral load was undectable. HIV NR. EGD - gastritis, Hpylori + s/p Rx   New onset of congestive heart failure (HCC) 05/16/2022   Obesity    OSA (obstructive sleep apnea)    "suppose to wear mask but I don't" (04/22/2014)   Peripheral neuropathy    Pneumonia    "this is probably the 2nd or 3rd time I've had pneumonia" (04/22/2014)   Recurrent boils    Seasonal asthma    Substance abuse (HCC)    Thrombocytosis    Hem/Onc suggested 2/2 chronic hepatits and/or iron deficiency  anemia    Past Surgical History:  Procedure Laterality Date   CESAREAN SECTION  1997   CORONARY ANGIOPLASTY WITH STENT PLACEMENT  2008   "2 stents"   ESOPHAGOGASTRODUODENOSCOPY N/A 04/23/2014   Procedure: ESOPHAGOGASTRODUODENOSCOPY (EGD);  Surgeon: Vertell Novak., MD;  Location: [facility] ENDOSCOPY;  Service: Endoscopy;  Laterality: N/A;   INCISION AND DRAINAGE ABSCESS Right 04/17/2021   Procedure: INCISION AND DRAINAGE CHEST WALL ABSCESS;  Surgeon: Diamantina Monks, MD;  Location: [facility] OR;  Service: General;  Laterality: Right;   IR ANGIOGRAM PELVIS SELECTIVE OR SUPRASELECTIVE  12/07/2016   IR ANGIOGRAM PELVIS SELECTIVE OR SUPRASELECTIVE  12/07/2016   IR ANGIOGRAM SELECTIVE EACH ADDITIONAL VESSEL  12/07/2016   IR ANGIOGRAM SELECTIVE EACH ADDITIONAL VESSEL  12/07/2016   IR EMBO ARTERIAL NOT HEMORR HEMANG INC GUIDE ROADMAPPING  12/07/2016   IR FLUORO GUIDE CV LINE RIGHT  04/19/2023   IR RADIOLOGIST EVAL & MGMT  01/09/2017   IR US GUIDE VASC ACCESS RIGHT  12/07/2016   IR US GUIDE VASC ACCESS RIGHT  04/19/2023   IRRIGATION AND DEBRIDEMENT FOOT Right 04/07/2020   Procedure: Incision and Drainage Right Foot;  Surgeon: Park Liter, DPM;  Location: [facility] ORS;  Service: Podiatry;  Laterality: Right;   RIGHT HEART CATH N/A 08/14/2022   Procedure: RIGHT HEART CATH;  Surgeon: Runell Gess, MD;  Location: [facility] INVASIVE CV LAB;  Service: Cardiovascular;  Laterality: N/A;    MEDICATIONS: No current facility-administered medications for this encounter.    albuterol (VENTOLIN HFA) 108 (90 Base) MCG/ACT inhaler   amLODipine (NORVASC) 5 MG tablet   apixaban (ELIQUIS) 5 MG TABS tablet   aspirin 81 MG chewable tablet   atorvastatin (LIPITOR) 40 MG tablet   Blood Glucose Monitoring Suppl (ONETOUCH VERIO FLEX SYSTEM) w/Device KIT   calcium acetate (PHOSLO) 667 MG capsule   carvedilol (COREG) 25 MG tablet   cyclobenzaprine (FLEXERIL) 10 MG tablet   diphenhydrAMINE (BENADRYL) 25 MG tablet   ferrous sulfate  325 (65 FE) MG tablet   glucose blood (ONETOUCH VERIO) test strip   hydrALAZINE (APRESOLINE) 100 MG tablet   Insulin Pen Needle (PEN NEEDLES 3/16") 31G X 5 MM MISC   INSULIN SYRINGE .5CC/28G (INS SYRINGE/NEEDLE .5CC/28G) 28G X 1/2" 0.5 ML MISC   isosorbide mononitrate (IMDUR) 30 MG 24 hr tablet   linaclotide (LINZESS) 145 MCG CAPS capsule   meclizine (ANTIVERT) 25 MG tablet   omeprazole (PRILOSEC) 40 MG capsule   OneTouch Delica Lancets 33G MISC   oxyCODONE-acetaminophen (PERCOCET) 10-325 MG tablet  senna (SENOKOT) 8.6 MG TABS tablet    Shonna Chock, PA-C Surgical Short Stay/Anesthesiology [facility] Phone [phone] [facility] Phone [phone] 07/31/2023 12:59 PM

## 2023-07-31 NOTE — Progress Notes (Signed)
 Unable to reach patient and complete SDW call. Left instructions on voicemail . See Pre op call checklist.

## 2023-08-01 ENCOUNTER — Ambulatory Visit (HOSPITAL_BASED_OUTPATIENT_CLINIC_OR_DEPARTMENT_OTHER): Admitting: Vascular Surgery

## 2023-08-01 ENCOUNTER — Ambulatory Visit (HOSPITAL_COMMUNITY)
Admission: RE | Admit: 2023-08-01 | Discharge: 2023-08-01 | Disposition: A | Discharge status: Home / Self Care | Attending: Vascular Surgery | Admitting: Vascular Surgery

## 2023-08-01 ENCOUNTER — Other Ambulatory Visit: Payer: Self-pay

## 2023-08-01 ENCOUNTER — Encounter (HOSPITAL_COMMUNITY)
Admission: RE | Disposition: A | Discharge status: Home / Self Care | Payer: Self-pay | Source: Home / Self Care | Attending: Vascular Surgery

## 2023-08-01 ENCOUNTER — Ambulatory Visit (HOSPITAL_COMMUNITY): Admitting: Vascular Surgery

## 2023-08-01 ENCOUNTER — Encounter (HOSPITAL_COMMUNITY): Payer: Self-pay | Admitting: Vascular Surgery

## 2023-08-01 DIAGNOSIS — Z8673 Personal history of transient ischemic attack (TIA), and cerebral infarction without residual deficits: Secondary | ICD-10-CM | POA: Diagnosis not present

## 2023-08-01 DIAGNOSIS — E785 Hyperlipidemia, unspecified: Secondary | ICD-10-CM | POA: Insufficient documentation

## 2023-08-01 DIAGNOSIS — G709 Myoneural disorder, unspecified: Secondary | ICD-10-CM | POA: Insufficient documentation

## 2023-08-01 DIAGNOSIS — G4733 Obstructive sleep apnea (adult) (pediatric): Secondary | ICD-10-CM | POA: Diagnosis not present

## 2023-08-01 DIAGNOSIS — K219 Gastro-esophageal reflux disease without esophagitis: Secondary | ICD-10-CM | POA: Diagnosis not present

## 2023-08-01 DIAGNOSIS — Z7901 Long term (current) use of anticoagulants: Secondary | ICD-10-CM | POA: Diagnosis not present

## 2023-08-01 DIAGNOSIS — I5020 Unspecified systolic (congestive) heart failure: Secondary | ICD-10-CM | POA: Diagnosis not present

## 2023-08-01 DIAGNOSIS — N186 End stage renal disease: Secondary | ICD-10-CM | POA: Insufficient documentation

## 2023-08-01 DIAGNOSIS — M797 Fibromyalgia: Secondary | ICD-10-CM | POA: Insufficient documentation

## 2023-08-01 DIAGNOSIS — Z86718 Personal history of other venous thrombosis and embolism: Secondary | ICD-10-CM | POA: Diagnosis not present

## 2023-08-01 DIAGNOSIS — I509 Heart failure, unspecified: Secondary | ICD-10-CM | POA: Diagnosis not present

## 2023-08-01 DIAGNOSIS — B181 Chronic viral hepatitis B without delta-agent: Secondary | ICD-10-CM | POA: Insufficient documentation

## 2023-08-01 DIAGNOSIS — N185 Chronic kidney disease, stage 5: Secondary | ICD-10-CM

## 2023-08-01 DIAGNOSIS — Z955 Presence of coronary angioplasty implant and graft: Secondary | ICD-10-CM | POA: Insufficient documentation

## 2023-08-01 DIAGNOSIS — E1122 Type 2 diabetes mellitus with diabetic chronic kidney disease: Secondary | ICD-10-CM | POA: Insufficient documentation

## 2023-08-01 DIAGNOSIS — I252 Old myocardial infarction: Secondary | ICD-10-CM | POA: Insufficient documentation

## 2023-08-01 DIAGNOSIS — Z79899 Other long term (current) drug therapy: Secondary | ICD-10-CM | POA: Diagnosis not present

## 2023-08-01 DIAGNOSIS — I132 Hypertensive heart and chronic kidney disease with heart failure and with stage 5 chronic kidney disease, or end stage renal disease: Secondary | ICD-10-CM

## 2023-08-01 DIAGNOSIS — Z992 Dependence on renal dialysis: Secondary | ICD-10-CM | POA: Diagnosis not present

## 2023-08-01 DIAGNOSIS — I2510 Atherosclerotic heart disease of native coronary artery without angina pectoris: Secondary | ICD-10-CM | POA: Diagnosis not present

## 2023-08-01 DIAGNOSIS — Z87891 Personal history of nicotine dependence: Secondary | ICD-10-CM | POA: Insufficient documentation

## 2023-08-01 HISTORY — PX: INSERTION OF ARTERIOVENOUS (AV) ARTEGRAFT ARM: SHX6779

## 2023-08-01 LAB — POCT I-STAT, CHEM 8
BUN: 48 mg/dL — ABNORMAL HIGH (ref 6–20)
Calcium, Ion: 1.13 mmol/L — ABNORMAL LOW (ref 1.15–1.40)
Chloride: 106 mmol/L (ref 98–111)
Creatinine, Ser: 7 mg/dL — ABNORMAL HIGH (ref 0.44–1.00)
Glucose, Bld: 154 mg/dL — ABNORMAL HIGH (ref 70–99)
HCT: 33 % — ABNORMAL LOW (ref 36.0–46.0)
Hemoglobin: 11.2 g/dL — ABNORMAL LOW (ref 12.0–15.0)
Potassium: 4.4 mmol/L (ref 3.5–5.1)
Sodium: 140 mmol/L (ref 135–145)
TCO2: 23 mmol/L (ref 22–32)

## 2023-08-01 LAB — GLUCOSE, CAPILLARY
Glucose-Capillary: 159 mg/dL — ABNORMAL HIGH (ref 70–99)
Glucose-Capillary: 133 mg/dL — ABNORMAL HIGH (ref 70–99)

## 2023-08-01 LAB — SURGICAL PCR SCREEN
MRSA, PCR: NEGATIVE
Staphylococcus aureus: POSITIVE — AB

## 2023-08-01 SURGERY — INSERTION, GRAFT, ARTERIOVENOUS, UPPER EXTREMITY
Anesthesia: General | Site: Arm Upper | Laterality: Left

## 2023-08-01 MED ORDER — CEFAZOLIN SODIUM-DEXTROSE 2-4 GM/100ML-% IV SOLN
2.0000 g | INTRAVENOUS | Status: AC
  Administered 2023-08-01: 2 g via INTRAVENOUS
  Filled 2023-08-01: qty 100

## 2023-08-01 MED ORDER — FENTANYL CITRATE (PF) 100 MCG/2ML IJ SOLN
INTRAMUSCULAR | Status: AC
  Filled 2023-08-01: qty 2

## 2023-08-01 MED ORDER — OXYCODONE HCL 5 MG/5ML PO SOLN: 5.0000 mg | Freq: Once | ORAL | Status: DC | PRN

## 2023-08-01 MED ORDER — SODIUM CHLORIDE 0.9% FLUSH: 10.0000 mL | Freq: Two times a day (BID) | INTRAVENOUS | Status: DC

## 2023-08-01 MED ORDER — MIDAZOLAM HCL 2 MG/2ML IJ SOLN
INTRAMUSCULAR | Status: AC
  Filled 2023-08-01: qty 2

## 2023-08-01 MED ORDER — ONDANSETRON HCL 4 MG/2ML IJ SOLN
INTRAMUSCULAR | Status: DC | PRN
Start: 2023-08-01 — End: 2023-08-01
  Administered 2023-08-01: 4 mg via INTRAVENOUS

## 2023-08-01 MED ORDER — INSULIN ASPART 100 UNIT/ML IJ SOLN: 0.0000 [IU] | INTRAMUSCULAR | Status: DC | PRN

## 2023-08-01 MED ORDER — FENTANYL CITRATE (PF) 100 MCG/2ML IJ SOLN: INTRAMUSCULAR | Status: DC | PRN

## 2023-08-01 MED ORDER — HEPARIN 6000 UNIT IRRIGATION SOLUTION
Status: DC | PRN
  Administered 2023-08-01: 1

## 2023-08-01 MED ORDER — 0.9 % SODIUM CHLORIDE (POUR BTL) OPTIME
TOPICAL | Status: DC | PRN
  Administered 2023-08-01: 1000 mL

## 2023-08-01 MED ORDER — PHENYLEPHRINE HCL-NACL 20-0.9 MG/250ML-% IV SOLN
INTRAVENOUS | Status: DC | PRN
  Administered 2023-08-01: 30 ug/min via INTRAVENOUS

## 2023-08-01 MED ORDER — SODIUM CHLORIDE 0.9% FLUSH: 10.0000 mL | INTRAVENOUS | Status: DC | PRN

## 2023-08-01 MED ORDER — CHLORHEXIDINE GLUCONATE CLOTH 2 % EX PADS: 6.0000 | MEDICATED_PAD | Freq: Every day | CUTANEOUS | Status: DC

## 2023-08-01 MED ORDER — SODIUM CHLORIDE 0.9% FLUSH: 3.0000 mL | INTRAVENOUS | Status: DC | PRN

## 2023-08-01 MED ORDER — SODIUM CHLORIDE 0.9 % IV SOLN: INTRAVENOUS | Status: DC

## 2023-08-01 MED ORDER — FENTANYL CITRATE (PF) 100 MCG/2ML IJ SOLN
25.0000 ug | INTRAMUSCULAR | Status: DC | PRN
  Administered 2023-08-01: 50 ug via INTRAVENOUS

## 2023-08-01 MED ORDER — HEPARIN 6000 UNIT IRRIGATION SOLUTION
Status: AC
  Filled 2023-08-01: qty 500

## 2023-08-01 MED ORDER — CHLORHEXIDINE GLUCONATE 0.12 % MT SOLN
15.0000 mL | Freq: Once | OROMUCOSAL | Status: AC
  Administered 2023-08-01: 15 mL via OROMUCOSAL
  Filled 2023-08-01: qty 15

## 2023-08-01 MED ORDER — MIDAZOLAM HCL 2 MG/2ML IJ SOLN
INTRAMUSCULAR | Status: DC | PRN
Start: 2023-08-01 — End: 2023-08-01
  Administered 2023-08-01: 2 mg via INTRAVENOUS

## 2023-08-01 MED ORDER — CHLORHEXIDINE GLUCONATE 4 % EX SOLN: 60.0000 mL | Freq: Once | CUTANEOUS | Status: DC

## 2023-08-01 MED ORDER — HEPARIN SODIUM (PORCINE) 1000 UNIT/ML IJ SOLN
INTRAMUSCULAR | Status: DC | PRN
  Administered 2023-08-01: 5000 [IU] via INTRAVENOUS

## 2023-08-01 MED ORDER — HEPARIN SODIUM (PORCINE) 1000 UNIT/ML IJ SOLN
INTRAMUSCULAR | Status: AC
  Filled 2023-08-01: qty 10

## 2023-08-01 MED ORDER — ACETAMINOPHEN 500 MG PO TABS
1000.0000 mg | ORAL_TABLET | Freq: Once | ORAL | Status: AC
  Administered 2023-08-01: 1000 mg via ORAL
  Filled 2023-08-01: qty 2

## 2023-08-01 MED ORDER — OXYCODONE HCL 5 MG PO TABS: 5.0000 mg | ORAL_TABLET | Freq: Once | ORAL | Status: DC | PRN

## 2023-08-01 MED ORDER — ORAL CARE MOUTH RINSE: 15.0000 mL | Freq: Once | OROMUCOSAL | Status: AC

## 2023-08-01 MED ORDER — HEPARIN SODIUM (PORCINE) 1000 UNIT/ML IJ SOLN
INTRAMUSCULAR | Status: AC
  Filled 2023-08-01: qty 2

## 2023-08-01 MED ORDER — FENTANYL CITRATE (PF) 250 MCG/5ML IJ SOLN
INTRAMUSCULAR | Status: DC | PRN
  Administered 2023-08-01: 50 ug via INTRAVENOUS
  Administered 2023-08-01 (×3): 25 ug via INTRAVENOUS

## 2023-08-01 MED ORDER — PROPOFOL 10 MG/ML IV BOLUS
INTRAVENOUS | Status: DC | PRN
  Administered 2023-08-01: 50 mg via INTRAVENOUS
  Administered 2023-08-01: 150 mg via INTRAVENOUS

## 2023-08-01 MED ORDER — HEPARIN SODIUM (PORCINE) 1000 UNIT/ML IJ SOLN
2000.0000 [IU] | Freq: Once | INTRAMUSCULAR | Status: AC
  Administered 2023-08-01: 2000 [IU]

## 2023-08-01 MED ORDER — FENTANYL CITRATE (PF) 250 MCG/5ML IJ SOLN
INTRAMUSCULAR | Status: AC
  Filled 2023-08-01: qty 5

## 2023-08-01 MED ORDER — PROPOFOL 10 MG/ML IV BOLUS
INTRAVENOUS | Status: AC
  Filled 2023-08-01: qty 20

## 2023-08-01 SURGICAL SUPPLY — 33 items
ARMBAND PINK RESTRICT EXTREMIT (MISCELLANEOUS) ×4 IMPLANT
BAG COUNTER SPONGE SURGICOUNT (BAG) ×2 IMPLANT
BLADE CLIPPER SURG (BLADE) ×2 IMPLANT
CANISTER SUCT 3000ML PPV (MISCELLANEOUS) ×2 IMPLANT
CLIP TI MEDIUM 6 (CLIP) ×2 IMPLANT
CLIP TI WIDE RED SMALL 6 (CLIP) ×3 IMPLANT
COVER PROBE W GEL 5X96 (DRAPES) ×2 IMPLANT
DERMABOND ADVANCED .7 DNX12 (GAUZE/BANDAGES/DRESSINGS) ×2 IMPLANT
ELECT REM PT RETURN 9FT ADLT (ELECTROSURGICAL) ×2 IMPLANT
ELECTRODE REM PT RTRN 9FT ADLT (ELECTROSURGICAL) ×2 IMPLANT
GLOVE BIO SURGEON STRL SZ7.5 (GLOVE) ×2 IMPLANT
GLOVE BIOGEL PI IND STRL 8 (GLOVE) ×2 IMPLANT
GOWN STRL REUS W/ TWL LRG LVL3 (GOWN DISPOSABLE) ×4 IMPLANT
GOWN STRL REUS W/ TWL XL LVL3 (GOWN DISPOSABLE) ×4 IMPLANT
GRAFT GORETEX STRT 4-7X45 (Vascular Products) ×1 IMPLANT
HEMOSTAT SPONGE AVITENE ULTRA (HEMOSTASIS) IMPLANT
INSERT FOGARTY SM (MISCELLANEOUS) ×1 IMPLANT
KIT BASIN OR (CUSTOM PROCEDURE TRAY) ×2 IMPLANT
KIT TURNOVER KIT B (KITS) ×2 IMPLANT
LOOP VESSEL MINI RED (MISCELLANEOUS) ×1 IMPLANT
NS IRRIG 1000ML POUR BTL (IV SOLUTION) ×2 IMPLANT
PACK CV ACCESS (CUSTOM PROCEDURE TRAY) ×2 IMPLANT
PAD ARMBOARD POSITIONER FOAM (MISCELLANEOUS) ×4 IMPLANT
SLING ARM FOAM STRAP LRG (SOFTGOODS) IMPLANT
SLING ARM FOAM STRAP MED (SOFTGOODS) IMPLANT
SPIKE FLUID TRANSFER (MISCELLANEOUS) ×2 IMPLANT
SUT MNCRL AB 4-0 PS2 18 (SUTURE) ×3 IMPLANT
SUT PROLENE 6 0 BV (SUTURE) ×5 IMPLANT
SUT PROLENE 7 0 BV 1 (SUTURE) IMPLANT
SUT VIC AB 3-0 SH 27X BRD (SUTURE) ×3 IMPLANT
TOWEL GREEN STERILE (TOWEL DISPOSABLE) ×2 IMPLANT
UNDERPAD 30X36 HEAVY ABSORB (UNDERPADS AND DIAPERS) ×2 IMPLANT
WATER STERILE IRR 1000ML POUR (IV SOLUTION) ×2 IMPLANT

## 2023-08-01 NOTE — H&P (Signed)
 History and Physical Interval Note:  08/01/2023 11:43 AM  Shirley [name]  has presented today for surgery, with the diagnosis of ESRD.  The various methods of treatment have been discussed with the patient and family. After consideration of risks, benefits and other options for treatment, the patient has consented to  Procedure(s): ARTERIOVENOUS (AV) FISTULA CREATION (Left) INSERTION, GRAFT, ARTERIOVENOUS, UPPER EXTREMITY (Left) as a surgical intervention.  [name] history has been reviewed, patient examined, no change in status, stable for surgery.  I have reviewed the patient's chart and labs.  Questions were answered to the patient's satisfaction.    Left arm AVF versus AVG  Cephus Shelling     Patient name: Shirley [name]          MRN: [id number]        DOB: [birth year]          Sex: female   REASON FOR CONSULT: New dilaysis access creation   HPI: Shirley [name] is a 48 y.o. female, with history of ESRD, diabetes, CAD, hypertension, hyperlipidemia, DVT on Eliquis that presents for evaluation of new dialysis access.  Patient states she has been on dialysis for 1 month.  She has a right IJ TDC.  She is right-handed.  No prior access in the past.       Past Medical History:  Diagnosis Date   Abscess of tunica vaginalis      10/09- Abundant S. aureus- sensitive to all abx   Anxiety     Blood dyscrasia     CAD (coronary artery disease) 06/15/2006    s/p Subendocardial MI with PDA angioplasty(no stent) on 06/15/06 and relook  cath 06/19/06 showed patency of site. Cath 12/10- no restenosis or significant CAD progression   CVA (cerebral vascular accident) (HCC) ~ 02/2014    denies residual on 04/22/2014   CVA (cerebral vascular accident) [facility])      history of remote right cerebellar infarct noted on head CT at least since 10/2011   Depression     Diabetes mellitus type 2, uncontrolled, with complications     Fibromyalgia     Gastritis     Gastroparesis      secondary to  poorly controlled DM, last emptying study performed 01/2010  was normal but may be falsely positive as pt was on reglan   GERD (gastroesophageal reflux disease)     Hepatitis B, chronic (HCC)      Hep BeAb+,Hep B cAb+ & Hep BsAg+ (9/06)   History of pyelonephritis      H/o GrpB Pyelonephritis (9/06) and UTI- 07/11- E.Coli, 12/10- GBS   Hyperlipidemia     Hypertension     Iron deficiency anemia     Irregular menses      Small ovarian follicles seen on CT(9/06)   MI (myocardial infarction) (HCC) 05/2006    PDA percutaneous transluminal coronary angioplasty   Migraine      "weekly" (04/22/2014)   N&V (nausea and vomiting)      Chronic. Unclear etiology with multiple admission and ED visits. CT abdomen with and without contrast (02/2011)  showed no acute process. Gastic Emptying scan (01/2010) was normal. Ultrasound of the abdomen was within normal limits. Hepatitis B viral load was undectable. HIV NR. EGD - gastritis, Hpylori + s/p Rx   New onset of congestive heart failure (HCC) 05/16/2022   Obesity     OSA (obstructive sleep apnea)      "suppose to wear mask but I don't" (04/22/2014)  Peripheral neuropathy     Pneumonia      "this is probably the 2nd or 3rd time I've had pneumonia" (04/22/2014)   Recurrent boils     Seasonal asthma     Substance abuse (HCC)     Thrombocytosis      Hem/Onc suggested 2/2 chronic hepatits and/or iron deficiency anemia               Past Surgical History:  Procedure Laterality Date   CESAREAN SECTION   1997   CORONARY ANGIOPLASTY WITH STENT PLACEMENT   2008    "2 stents"   ESOPHAGOGASTRODUODENOSCOPY N/A 04/23/2014    Procedure: ESOPHAGOGASTRODUODENOSCOPY (EGD);  Surgeon: Vertell Novak., MD;  Location: [facility] ENDOSCOPY;  Service: Endoscopy;  Laterality: N/A;   INCISION AND DRAINAGE ABSCESS Right 04/17/2021    Procedure: INCISION AND DRAINAGE CHEST WALL ABSCESS;  Surgeon: Diamantina Monks, MD;  Location: [facility] OR;  Service: General;  Laterality:  Right;   IR ANGIOGRAM PELVIS SELECTIVE OR SUPRASELECTIVE   12/07/2016   IR ANGIOGRAM PELVIS SELECTIVE OR SUPRASELECTIVE   12/07/2016   IR ANGIOGRAM SELECTIVE EACH ADDITIONAL VESSEL   12/07/2016   IR ANGIOGRAM SELECTIVE EACH ADDITIONAL VESSEL   12/07/2016   IR EMBO ARTERIAL NOT HEMORR HEMANG INC GUIDE ROADMAPPING   12/07/2016   IR FLUORO GUIDE CV LINE RIGHT   04/19/2023   IR RADIOLOGIST EVAL & MGMT   01/09/2017   IR US GUIDE VASC ACCESS RIGHT   12/07/2016   IR US GUIDE VASC ACCESS RIGHT   04/19/2023   IRRIGATION AND DEBRIDEMENT FOOT Right 04/07/2020    Procedure: Incision and Drainage Right Foot;  Surgeon: Park Liter, DPM;  Location: [facility] ORS;  Service: Podiatry;  Laterality: Right;   RIGHT HEART CATH N/A 08/14/2022    Procedure: RIGHT HEART CATH;  Surgeon: Runell Gess, MD;  Location[facility] INVASIVE CV LAB;  Service: Cardiovascular;  Laterality: N/A;               Family History  Problem Relation Age of Onset   Diabetes Father     Healthy Mother            SOCIAL HISTORY: Social History         Socioeconomic History   Marital status: Single      Spouse name: Not on file   Number of children: 2   Years of education: 11   Highest education level: Not on file  Occupational History   Occupation: other      Comment: unemployed      Comment: disability  Tobacco Use   Smoking status: Former      Current packs/day: 0.00      Types: Cigarettes      Quit date: 04/24/1996      Years since quitting: 27.2   Smokeless tobacco: Never   Tobacco comments:      quit smoking cigarettes age 22  Vaping Use   Vaping status: Never Used  Substance and Sexual Activity   Alcohol use: No      Alcohol/week: 0.0 standard drinks of alcohol      Comment: 04/22/2014 "might have a few drinks a month"   Drug use: Not Currently      Types: Marijuana, Cocaine      Comment: 04/22/2104 "quit drugs ~ 1-2 yr ago"   Sexual activity: Yes      Birth control/protection: None  Other Topics Concern   Not  on file  Social  History Narrative    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              Social Drivers of Acupuncturist Strain: Low Risk  (08/09/2022)    Overall Financial Resource Strain (CARDIA)     Difficulty of Paying Living Expenses: Not hard at all  Food Insecurity: No Food Insecurity (04/16/2023)    Hunger Vital Sign     Worried About Running Out of Food in the Last Year: Never true     Ran Out of Food in the Last Year: Never true  Transportation Needs: Patient Unable To Answer (04/16/2023)    PRAPARE - Transportation     Lack of Transportation (Medical): Patient unable to answer     Lack of Transportation (Non-Medical): Patient unable to answer  Physical Activity: Not on file  Stress: Not on file  Social Connections: Not on file  Intimate Partner Violence: Patient Unable To Answer (04/16/2023)    Humiliation, Afraid, Rape, and Kick questionnaire     Fear of Current or Ex-Partner: Patient unable to answer     Emotionally Abused: Patient unable to answer     Physically Abused: Patient unable to answer     Sexually Abused: Patient unable to answer      Allergies      Allergies  Allergen Reactions   Morphine And Codeine Itching and Swelling   Zestril [Lisinopril] Nausea Only and Swelling              Current Outpatient Medications  Medication Sig Dispense Refill   albuterol (VENTOLIN HFA) 108 (90 Base) MCG/ACT inhaler Inhale 2 puffs into the lungs every 4 (four) hours as needed for wheezing or shortness of breath. 8 g 1   amLODipine (NORVASC) 5 MG tablet Take 1 tablet (5 mg total) by mouth daily. 30 tablet 3   apixaban (ELIQUIS) 5 MG TABS tablet Take 1 tablet (5 mg total) by mouth 2 (two) times daily. 60 tablet 3   aspirin 81 MG chewable tablet Chew 81 mg by mouth daily.       Blood Glucose Monitoring Suppl (ONETOUCH VERIO FLEX  SYSTEM) w/Device KIT Use in the morning, at noon, and at bedtime. 1 kit 0   calcium acetate (PHOSLO) 667 MG capsule Take 1 capsule (667 mg total) by mouth 3 (three) times daily with meals. 90 capsule 3   cyclobenzaprine (FLEXERIL) 10 MG tablet Take 10 mg by mouth 3 (three) times daily as needed for muscle spasms.       diphenhydrAMINE (BENADRYL) 25 MG tablet Take 25 mg by mouth every 6 (six) hours as needed for allergies.       ferrous sulfate 325 (65 FE) MG tablet Take 325 mg by mouth daily with breakfast.       glucose blood (ONETOUCH VERIO) test strip Use as instructed to check blood sugar 3 times daily. Dx codes: E11.8, E11.65 100 each 11   hydrALAZINE (APRESOLINE) 100 MG tablet Take 1 tablet (100 mg total) by mouth every 8 (eight) hours. 180 tablet 3   Insulin Pen Needle (PEN NEEDLES 3/16") 31G X 5 MM MISC 1 Device by Does not apply route 4 (four) times daily - after meals and at bedtime. 100 each 5   INSULIN SYRINGE .5CC/28G (INS SYRINGE/NEEDLE .5CC/28G) 28G X 1/2" 0.5 ML  MISC Please provide 1 month supply 100 each 0   isosorbide mononitrate (IMDUR) 30 MG 24 hr tablet Take 3 tablets (90 mg total) by mouth daily. 30 tablet 3   linaclotide (LINZESS) 145 MCG CAPS capsule Take 145 mcg by mouth daily as needed (for constipation).       meclizine (ANTIVERT) 25 MG tablet Take 1 tablet (25 mg total) by mouth 3 (three) times daily as needed for dizziness. 30 tablet 0   omeprazole (PRILOSEC) 40 MG capsule Take 40 mg by mouth daily before breakfast.       OneTouch Delica Lancets 33G MISC Use to check blood sugar 3 times daily. Dx codes: E11.8, E11.65 100 each 11   oxyCODONE-acetaminophen (PERCOCET) 10-325 MG tablet Take 1 tablet by mouth 3 (three) times daily.       senna (SENOKOT) 8.6 MG TABS tablet Take 1 tablet (8.6 mg total) by mouth 2 (two) times daily. 30 tablet 0   atorvastatin (LIPITOR) 40 MG tablet Take 1 tablet (40 mg total) by mouth daily. (Patient not taking: Reported on 04/20/2023) 90 tablet  3   carvedilol (COREG) 25 MG tablet Take 1 tablet (25 mg total) by mouth 2 (two) times daily with a meal. (Patient not taking: Reported on 04/20/2023) 180 tablet 3      No current facility-administered medications for this visit.        REVIEW OF SYSTEMS:  [X]  denotes positive finding, [ ]  denotes negative finding Cardiac   Comments:  Chest pain or chest pressure:      Shortness of breath upon exertion:      Short of breath when lying flat:      Irregular heart rhythm:             Vascular      Pain in calf, thigh, or hip brought on by ambulation:      Pain in feet at night that wakes you up from your sleep:       Blood clot in your veins:      Leg swelling:              Pulmonary      Oxygen at home:      Productive cough:       Wheezing:              Neurologic      Sudden weakness in arms or legs:       Sudden numbness in arms or legs:       Sudden onset of difficulty speaking or slurred speech:      Temporary loss of vision in one eye:       Problems with dizziness:              Gastrointestinal      Blood in stool:       Vomited blood:              Genitourinary      Burning when urinating:       Blood in urine:             Psychiatric      Major depression:              Hematologic      Bleeding problems:      Problems with blood clotting too easily:             Skin      Rashes or ulcers:  Constitutional      Fever or chills:          PHYSICAL EXAM:    Vitals:    07/27/23 0951  BP: (!) 174/98  Pulse: 94  Resp: 18  Temp: 98 F (36.7 C)  TempSrc: Temporal  SpO2: 100%  Weight: 226 lb 11.2 oz (102.8 kg)  Height: 5\' 7"  (1.702 m)      GENERAL: The patient is a well-nourished female, in no acute distress. The vital signs are documented above. CARDIAC: There is a regular rate and rhythm.  VASCULAR:  Bilateral radial brachial pulses palpable Right IJ TDC PULMONARY: No respiratory distress. ABDOMEN: Soft and  non-tender. MUSCULOSKELETAL: There are no major deformities or cyanosis. NEUROLOGIC: No focal weakness or paresthesias are detected. SKIN: There are no ulcers or rashes noted. PSYCHIATRIC: The patient has a normal affect.   DATA:    UPPER EXTREMITY VEIN MAPPING  Patient Name:  Shirley [name]  Date of Exam:   04/19/2023 Medical Rec #: [id number]         Accession #:    [id number] Date of Birth: [birth year]         Patient Gender: F Patient Age:   89 years Exam Location:  [facility] Procedure:      VAS [place] UPPER EXT VEIN MAPPING (PRE-OP AVF) Referring Phys: Lawson Fiscal FOSTER   --------------------------------------------------------------------------- -----   Indications: Pre-access.  Performing Technologist: Fernande Bras    Examination Guidelines: A complete evaluation includes B-mode imaging, spectral Doppler, color Doppler, and power Doppler as needed of all accessible portions of each vessel. Bilateral testing is considered an integral part of a complete examination. Limited examinations for reoccurring indications may be performed as noted.  +-----------------+-------------+----------+---------+ Right Cephalic   Diameter (cm)Depth (cm)Findings  +-----------------+-------------+----------+---------+ Shoulder             0.24        1.90             +-----------------+-------------+----------+---------+ Prox upper arm       0.24        1.43             +-----------------+-------------+----------+---------+ Mid upper arm        0.18        0.69   branching +-----------------+-------------+----------+---------+ Dist upper arm       0.22        0.22   Thrombus  +-----------------+-------------+----------+---------+ Antecubital fossa    0.21        0.26   Thrombus  +-----------------+-------------+----------+---------+ Prox forearm         0.15        0.39    branching +-----------------+-------------+----------+---------+ Mid forearm          0.09        0.17             +-----------------+-------------+----------+---------+ Dist forearm         0.10        0.40             +-----------------+-------------+----------+---------+ Wrist                0.07        0.27             +-----------------+-------------+----------+---------+  +-----------------+-------------+----------+--------------+ Right Basilic    Diameter (cm)Depth (cm)   Findings    +-----------------+-------------+----------+--------------+ Mid upper arm        0.37                              +-----------------+-------------+----------+--------------+  Dist upper arm       0.21                              +-----------------+-------------+----------+--------------+ Antecubital fossa    0.12                              +-----------------+-------------+----------+--------------+ Prox forearm                            not visualized +-----------------+-------------+----------+--------------+ Mid forearm                             not visualized +-----------------+-------------+----------+--------------+ Distal forearm                          not visualized +-----------------+-------------+----------+--------------+ Wrist                                   not visualized +-----------------+-------------+----------+--------------+  +-----------------+-------------+----------+-----------------+ Left Cephalic    Diameter (cm)Depth (cm)    Findings      +-----------------+-------------+----------+-----------------+ Shoulder             0.27        2.00                     +-----------------+-------------+----------+-----------------+ Prox upper arm       0.27        1.61                     +-----------------+-------------+----------+-----------------+ Mid upper arm        0.17        0.89        branching     +-----------------+-------------+----------+-----------------+ Dist upper arm       0.18        0.21                     +-----------------+-------------+----------+-----------------+ Antecubital fossa    0.26        0.22       Thrombus      +-----------------+-------------+----------+-----------------+ Prox forearm         0.29        0.31       Thrombus      +-----------------+-------------+----------+-----------------+ Mid forearm          0.28        0.33   Thrombus/catheter +-----------------+-------------+----------+-----------------+ Dist forearm         0.11        0.35                     +-----------------+-------------+----------+-----------------+ Wrist                0.17        0.21                     +-----------------+-------------+----------+-----------------+  +-----------------+-------------+----------+--------------+ Left Basilic     Diameter (cm)Depth (cm)   Findings    +-----------------+-------------+----------+--------------+ Dist upper arm       0.16                              +-----------------+-------------+----------+--------------+  Antecubital fossa                       not visualized +-----------------+-------------+----------+--------------+ Prox forearm         0.11                              +-----------------+-------------+----------+--------------+ Mid forearm                             not visualized +-----------------+-------------+----------+--------------+ Distal forearm       0.07                              +-----------------+-------------+----------+--------------+ Wrist                0.09                              +-----------------+-------------+----------+--------------+  Limited study due to edema, patient's body habitus and inability to properly position. *See table(s) above for measurements and observations.     Diagnosing physician: Gerarda Fraction Electronically signed by Gerarda Fraction on 04/19/2023 at 4:21:55 PM.      Assessment/Plan:   48 y.o. female, with history of ESRD, diabetes, CAD, hypertension, hyperlipidemia, DVT on Eliquis that presents for evaluation of new dialysis access.  I discussed access in the nondominant arm which would be her left arm.  Her vein mapping in December showed small basilic veins bilaterally with thrombus in both cephalic veins.  I discussed high risk for AV graft as she does not appear to have good surface vein.  Will evaluate left arm with ultrasound in the OR to ensure there is no vein that is usable.  I discussed to be placing graft this takes 1 month to mature.  Discussed this being done as an outpatient at [facility].  Will need to hold her Eliquis.  Risk-benefit discussed including failure to mature and risk of steal.     Cephus Shelling, MD [facility] Office: [phone]

## 2023-08-01 NOTE — Anesthesia Procedure Notes (Signed)
 Procedure Name: Intubation Date/Time: 08/01/2023 12:35 PM  Performed by: Berniece Pap, CRNAPre-anesthesia Checklist: Patient identified, Emergency Drugs available, Suction available and Patient being monitored Patient Re-evaluated:Patient Re-evaluated prior to induction Oxygen Delivery Method: Circle system utilized Preoxygenation: Pre-oxygenation with 100% oxygen Induction Type: IV induction Ventilation: Mask ventilation without difficulty LMA: LMA inserted LMA Size: 4.0 Tube type: Oral Number of attempts: 1 Placement Confirmation: positive ETCO2 and breath sounds checked- equal and bilateral Tube secured with: Tape Dental Injury: Teeth and Oropharynx as per pre-operative assessment

## 2023-08-01 NOTE — Discharge Instructions (Signed)

## 2023-08-01 NOTE — Op Note (Signed)
 OPERATIVE NOTE  DATE: August 01, 2023  PROCEDURE:  left upper arm arteriovenous graft (4 mm x 7 mm tapered gore-tex graft)   PRE-OPERATIVE DIAGNOSIS: end stage renal disease   POST-OPERATIVE DIAGNOSIS: same  SURGEON: Cephus Shelling, MD  ASSISTANT(S): OR staff  ANESTHESIA: general  ESTIMATED BLOOD LOSS: <75 mL  FINDING(S): Palpable thrill at end of case Palpable radial pulse at end of case. High brachial bifurcation (anastomosis to larger artery above antecubital fossa)  SPECIMEN(S):  None  INDICATIONS:   Shirley [name] is a 48 y.o. female who presents with end stage renal disease and the need for permanent hemodialysis access.  Risk, benefits, and alternatives to access surgery were discussed.  The patient is aware the risks include but are not limited to: bleeding, infection, steal syndrome, nerve damage, ischemic monomelic neuropathy, failure to mature, and need for additional procedures.  The patient is aware of the risks and elects to proceed forward.  DESCRIPTION: After full informed written consent was obtained from the patient, the patient was brought back to the operating room and placed supine upon the operating table.  The patient was given IV antibiotics prior to proceeding.  After obtaining adequate sedation, the patient was prepped and draped in standard fashion for a left arm access procedure.  I evaluated all of the surface veins with ultrasound and they were small as indicated on pre-operative vein mapping.  I turned my attention first to the antecubitum.  Under ultrasound guidance, I identified the location of the brachial artery and marked it on the skin.  I then looked on the upper arm near the axilla and marked the brachial vein as well as axillary vein. I made a longitudinal incision over the brachial artery above the antecubitum and another longitudinal incision over the brachial vein near the axillary vein.  I dissected down through the subcutaneous  tissue and  fascia carefully and was able to dissect out the brachial artery.  The artery was about 3.5 mm externally.  It was controlled proximally and distally with vessel loops.  I then dissected out the larger brachial vein through the upper arm incision.  Externally, it appeared to be 5 mm in diameter.  I then dissected this vein proximally and distal.  II took a metal Gore tunneler and dissected from the antecubital incision to the axillary incision.  Then I delivered the 4 x 7-mm stretch Gore-Tex graft, through this metal tunneler and then pulled out the metal tunneler leaving the graft in place.  The 4-mm end was left on the brachial artery side and the 7-mm end toward the vein side.  I then gave the patient 5,000 units of heparin to gain anticoagulation.  After waiting 2 minutes, I placed the brachial artery under tension proximally and distally with vessel loops, made an arteriotomy and extended it with a Potts scissor.  I sewed the 4-mm end of the graft to this arteriotomy with a running stitch of 6-0 Prolene.  At this point, then I completed the anastomosis in the usual fashion.  I released the vessel loops on the inflow and allowed the artery to decompress through the graft. There was good pulsatile bleeding through this graft.  I clamped the graft near its arterial anastomosis and sucked out all the blood in the graft and loaded the graft with heparinized saline.  At this point, I pulled the graft to appropriate length and reset my exposure of the high brachial vein. The vein was controlled with  Henle clamps and opened with 11 blade scalpel and extended with Potts scissors.  There was good venous backbleeding from the vein. I spatulated the graft to facilitate an end-to-side anastomosis.  In the process of spatulating, I cut the graft to appropriate length for this anastomosis.  This graft was sewn to the vein in an end-to-side configuration with a 6-0 Prolene.  Prior to completing this anastomosis, I  allowed the vein to back bleed and then I also allowed the artery to bleed in an antegrade fashion.  I completed this anastomosis in the usual fashion.  I irrigated out both incisions.  The graft had a good palpable thrill.  The radial artery had a palpable pulse.  The subcutaneous tissue in each incision was reapproximated with a running stitch of 3-0 Vicryl.  The skin was then reapproximated with a running subcuticular 4-0 Monocryl.  The skin was then cleaned, dried, and Dermabond used to reinforce the skin closure.   COMPLICATIONS: None  CONDITION: Stable   Cephus Shelling, MD [facility] Office: [phone]  Cephus Shelling    08/01/2023, 2:15 PM

## 2023-08-01 NOTE — Transfer of Care (Signed)
 Immediate Anesthesia Transfer of Care Note  Patient: Shirley [name]  Procedure(s) Performed: LEFT ARTERIOVENOUS GRAFT (Left: Arm Upper)  Patient Location: PACU  Anesthesia Type:General  Level of Consciousness: drowsy and patient cooperative  Airway & Oxygen Therapy: Patient connected to face mask oxygen  Post-op Assessment: Report given to RN and Post -op Vital signs reviewed and stable  Post vital signs: Reviewed and stable  Last Vitals:  Vitals Value Taken Time  BP 145/98 08/01/23 1430  Temp    Pulse 128 08/01/23 1433  Resp 24 08/01/23 1433  SpO2 99 % 08/01/23 1433  Vitals shown include unfiled device data.  Last Pain:  Vitals:   08/01/23 1017  TempSrc:   PainSc: 8       Patients Stated Pain Goal: 1 (08/01/23 1017)  Complications: No notable events documented.

## 2023-08-01 NOTE — OR Nursing (Signed)
 Pt brought her purse into the OR with her by hiding the purse in between her legs while on the stretcher. Pt stated that she did not want to leave her purse unattended.Purse was put into a biohazard bag, labeled with a pt sticker, and taken to the front desk. Management was notified, Patrici Ranks then took the purse to PACU and added it to the pt belongings bag.

## 2023-08-02 ENCOUNTER — Encounter (HOSPITAL_COMMUNITY): Payer: Self-pay | Admitting: Vascular Surgery

## 2023-08-02 NOTE — Anesthesia Postprocedure Evaluation (Signed)
 Anesthesia Post Note  Patient: Shirley [name]  Procedure(s) Performed: LEFT ARTERIOVENOUS GRAFT (Left: Arm Upper)     Patient location during evaluation: PACU Anesthesia Type: General Level of consciousness: awake and alert Pain management: pain level controlled Vital Signs Assessment: post-procedure vital signs reviewed and stable Respiratory status: spontaneous breathing, nonlabored ventilation, respiratory function stable and patient connected to nasal cannula oxygen Cardiovascular status: blood pressure returned to baseline and stable Postop Assessment: no apparent nausea or vomiting Anesthetic complications: no   No notable events documented.  Last Vitals:  Vitals:   08/01/23 1515 08/01/23 1530  BP: (!) 145/96 (!) 156/94  Pulse: (!) 103 (!) 111  Resp: 10 19  Temp:  36.6 C  SpO2: 99% 93%    Last Pain:  Vitals:   08/01/23 1530  TempSrc:   PainSc: 3                  Kennieth Rad

## 2023-08-28 ENCOUNTER — Ambulatory Visit: Attending: Vascular Surgery | Admitting: Physician Assistant

## 2023-08-28 VITALS — BP 156/75 | HR 68 | Temp 98.3°F | Wt 230.3 lb

## 2023-08-28 DIAGNOSIS — N186 End stage renal disease: Secondary | ICD-10-CM

## 2023-08-28 DIAGNOSIS — Z992 Dependence on renal dialysis: Secondary | ICD-10-CM

## 2023-08-28 NOTE — Progress Notes (Signed)
 POST OPERATIVE OFFICE NOTE    CC:  F/u for surgery  HPI:  This is a 48 y.o. female who is s/p LUA AVG on 08/01/2023 by Dr. Fulton Job.  She has a TDC that was placed on 04/19/2023 by Interventional Radiology.  She also has hx of ESRD,  diabetes, CAD, hypertension, hyperlipidemia, DVT on Eliquis .   Pt states she does not have pain/numbness in the left hand.    The pt is on dialysis M/W/F at [facility] [address] location.   Allergies  Allergen Reactions   Codeine  Itching    Pt states not allergic.   Morphine  Itching   Lisinopril Nausea Only, Swelling and Nausea And Vomiting   Morphine  And Codeine  Itching and Swelling    Current Outpatient Medications  Medication Sig Dispense Refill   albuterol  (PROVENTIL ) (2.5 MG/3ML) 0.083% nebulizer solution Take 2.5 mg by nebulization every 6 (six) hours as needed for wheezing or shortness of breath.     albuterol  (VENTOLIN  HFA) 108 (90 Base) MCG/ACT inhaler Inhale 2 puffs into the lungs every 4 (four) hours as needed for wheezing or shortness of breath. 8 g 1   amLODipine  (NORVASC ) 10 MG tablet Take 10 mg by mouth daily.     amLODipine  (NORVASC ) 5 MG tablet Take 1 tablet (5 mg total) by mouth daily. (Patient not taking: Reported on 08/01/2023) 30 tablet 3   apixaban  (ELIQUIS ) 5 MG TABS tablet Take 1 tablet (5 mg total) by mouth 2 (two) times daily. 60 tablet 3   aspirin  81 MG chewable tablet Chew 81 mg by mouth daily. (Patient not taking: Reported on 08/01/2023)     atorvastatin  (LIPITOR ) 40 MG tablet Take 40 mg by mouth daily. (Patient not taking: Reported on 08/01/2023)     Blood Glucose Monitoring Suppl (ONETOUCH VERIO FLEX SYSTEM) w/Device KIT Use in the morning, at noon, and at bedtime. 1 kit 0   calcium  acetate (PHOSLO ) 667 MG capsule Take 1 capsule (667 mg total) by mouth 3 (three) times daily with meals. (Patient not taking: Reported on 08/01/2023) 90 capsule 3   carvedilol  (COREG ) 25 MG tablet Take 1 tablet (25 mg total) by mouth 2 (two) times daily with a  meal. (Patient not taking: Reported on 04/20/2023) 180 tablet 3   cyclobenzaprine  (FLEXERIL ) 10 MG tablet Take 10 mg by mouth 3 (three) times daily as needed for muscle spasms. (Patient not taking: Reported on 08/01/2023)     diphenhydrAMINE  (BENADRYL ) 25 MG tablet Take 25 mg by mouth every 6 (six) hours as needed for allergies.     docusate sodium  (COLACE) 100 MG capsule Take 100 mg by mouth 2 (two) times daily as needed for mild constipation or moderate constipation. (Patient not taking: Reported on 08/01/2023)     famotidine  (PEPCID ) 20 MG tablet Take 20 mg by mouth daily. (Patient not taking: Reported on 08/01/2023)     ferrous sulfate  325 (65 FE) MG tablet Take 325 mg by mouth daily with breakfast. (Patient not taking: Reported on 08/01/2023)     furosemide  (LASIX ) 80 MG tablet Take 80 mg by mouth 2 (two) times daily.     glucose blood (ONETOUCH VERIO) test strip Use as instructed to check blood sugar 3 times daily. Dx codes: E11.8, E11.65 100 each 11   hydrALAZINE  (APRESOLINE ) 100 MG tablet Take 1 tablet (100 mg total) by mouth every 8 (eight) hours. (Patient not taking: Reported on 08/01/2023) 180 tablet 3   Insulin  Pen Needle (PEN NEEDLES 3/16") 31G X 5 MM MISC  1 Device by Does not apply route 4 (four) times daily - after meals and at bedtime. 100 each 5   INSULIN  SYRINGE .5CC/28G (INS SYRINGE/NEEDLE .5CC/28G) 28G X 1/2" 0.5 ML MISC Please provide 1 month supply 100 each 0   isosorbide  mononitrate (IMDUR ) 30 MG 24 hr tablet Take 3 tablets (90 mg total) by mouth daily. (Patient not taking: Reported on 08/01/2023) 30 tablet 3   linaclotide  (LINZESS ) 145 MCG CAPS capsule Take 145 mcg by mouth daily as needed (for constipation). (Patient not taking: Reported on 08/01/2023)     meclizine  (ANTIVERT ) 25 MG tablet Take 1 tablet (25 mg total) by mouth 3 (three) times daily as needed for dizziness. (Patient not taking: Reported on 08/01/2023) 30 tablet 0   metoprolol  succinate (TOPROL -XL) 50 MG 24 hr tablet Take 50 mg  by mouth daily. (Patient not taking: Reported on 08/01/2023)     omeprazole  (PRILOSEC) 40 MG capsule Take 40 mg by mouth daily before breakfast.     ondansetron  (ZOFRAN -ODT) 4 MG disintegrating tablet Take 4 mg by mouth every 8 (eight) hours as needed for nausea or vomiting.     OneTouch Delica Lancets 33G MISC Use to check blood sugar 3 times daily. Dx codes: E11.8, E11.65 100 each 11   oxyCODONE -acetaminophen  (PERCOCET) 10-325 MG tablet Take 1 tablet by mouth 3 (three) times daily.     REGLAN  5 MG tablet Take 5 mg by mouth 3 (three) times daily as needed for nausea or vomiting.     senna (SENOKOT) 8.6 MG TABS tablet Take 1 tablet (8.6 mg total) by mouth 2 (two) times daily. (Patient not taking: Reported on 08/01/2023) 30 tablet 0   silver  sulfADIAZINE  (SILVADENE ) 1 % cream Apply 1 Application topically daily. (Patient not taking: Reported on 08/01/2023)     sodium bicarbonate  650 MG tablet Take 1,300 mg by mouth 2 (two) times daily. (Patient not taking: Reported on 08/01/2023)     No current facility-administered medications for this visit.     ROS:  See HPI  Physical Exam:  Today's Vitals   08/28/23 1340  BP: (!) 156/75  Pulse: 68  Temp: 98.3 F (36.8 C)  TempSrc: Temporal  SpO2: 93%  Weight: 230 lb 4.8 oz (104.5 kg)   Body mass index is 36.07 kg/m.   Incision:  both incisions have healed nicely Extremities:   There is a palpable left radial pulse.   Motor and sensory are in tact.   There is a thrill/bruit present.  Access is  easily palpable    Assessment/Plan:  This is a 48 y.o. female who is s/p: LUA AVG on 08/01/2023 by Dr. Fulton Job  -the pt does not have evidence of steal. -pt's access can be used Friday 08/31/2023. -if pt has tunneled catheter, this can be scheduled to be removed with interventional radiology at the discretion of the dialysis center once the pt's access has been successfully cannulated to their satisfaction.  -discussed with pt that access does not last  forever and will need intervention or even new access at some point.  -the pt will follow up as needed.   Maryanna Smart, [facility] [phone]  Clinic MD:  Edgardo Goodwill

## 2023-10-13 ENCOUNTER — Other Ambulatory Visit (HOSPITAL_COMMUNITY): Payer: Self-pay | Admitting: Cardiology

## 2023-11-08 ENCOUNTER — Other Ambulatory Visit (HOSPITAL_COMMUNITY): Payer: Self-pay | Admitting: Internal Medicine

## 2023-11-08 DIAGNOSIS — N186 End stage renal disease: Secondary | ICD-10-CM

## 2023-11-13 ENCOUNTER — Ambulatory Visit (HOSPITAL_COMMUNITY)
Admission: RE | Admit: 2023-11-13 | Discharge: 2023-11-13 | Disposition: A | Discharge status: Home / Self Care | Source: Ambulatory Visit | Attending: Internal Medicine | Admitting: Internal Medicine

## 2023-11-13 DIAGNOSIS — Z4901 Encounter for fitting and adjustment of extracorporeal dialysis catheter: Secondary | ICD-10-CM | POA: Insufficient documentation

## 2023-11-13 DIAGNOSIS — N186 End stage renal disease: Secondary | ICD-10-CM | POA: Insufficient documentation

## 2023-11-13 HISTORY — PX: IR REMOVAL TUN CV CATH W/O FL: IMG2289

## 2023-11-13 MED ORDER — LIDOCAINE HCL 1 % IJ SOLN
INTRAMUSCULAR | Status: AC
  Filled 2023-11-13: qty 20

## 2023-11-13 NOTE — Procedures (Signed)
 PROCEDURE SUMMARY:  Successful removal of tunneled hemodialysis catheter.  Patient tolerated well.  EBL < 5 mL  See full dictation in Imaging for details.  Toya DEL Owens Hara PA-C 11/13/2023 12:17 PM

## 2023-11-20 IMAGING — CT CT CHEST-ABD-PELV W/ CM
2 of 5 series · 14 of 46 positions shown, 16 images · IV contrast (omnipaque)
Comparison: CT abdomen pelvis 09/21/2017, chest x-ray 04/16/2021,
CT chest 11/07/2016

CLINICAL DATA: Sepsis pain beneath the right breast

EXAM:
CT CHEST, ABDOMEN, AND PELVIS WITH CONTRAST
TECHNIQUE: Multidetector CT imaging of the chest, abdomen and pelvis was
performed following the standard protocol during bolus
administration of intravenous contrast.
CONTRAST:  100mL OMNIPAQUE IOHEXOL 300 MG/ML  SOLN

[Series 3: cap with · axial · 0.91mm/px · z∈[-151,+399]mm · 11 of 130 slices shown, 13 images]
[im 10/130  soft-tissue]
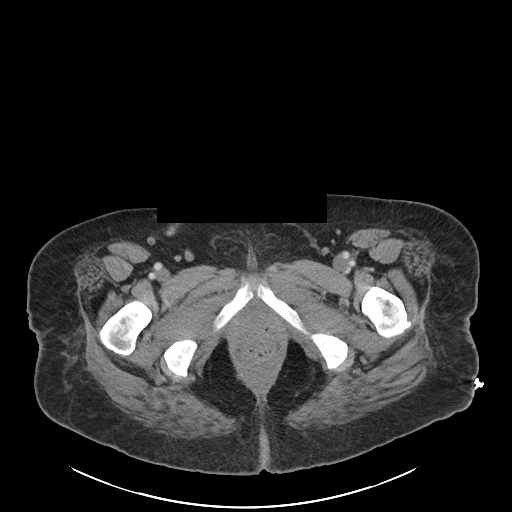
[im 10/130  bone]
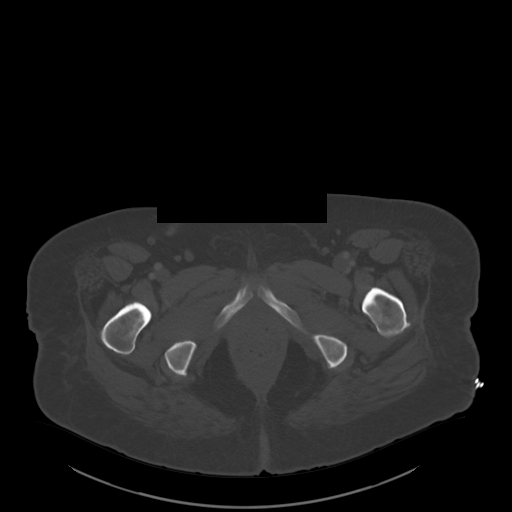
[im 19/130  soft-tissue]
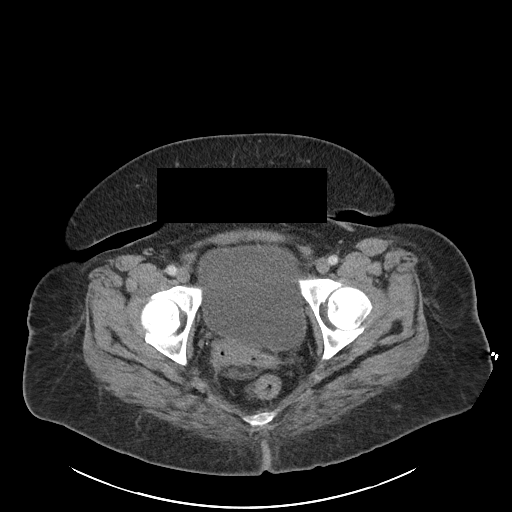
[im 28/130  soft-tissue]
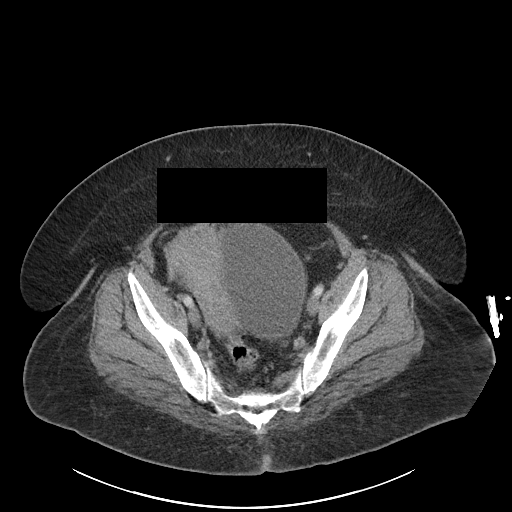
[im 47/130  soft-tissue]
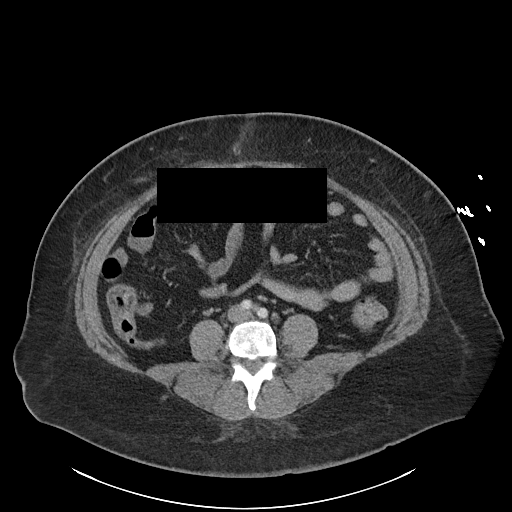
[im 56/130  soft-tissue]
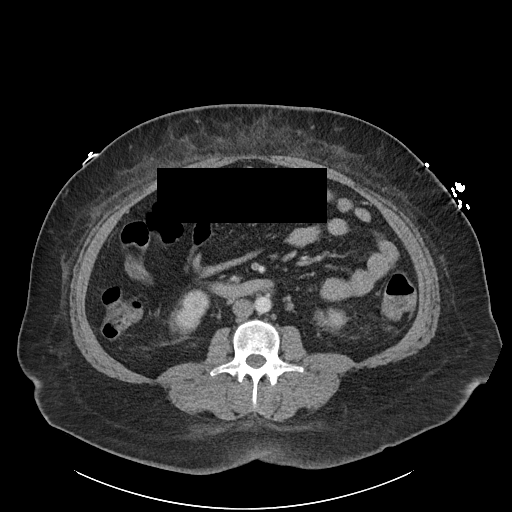
[im 65/130  soft-tissue]
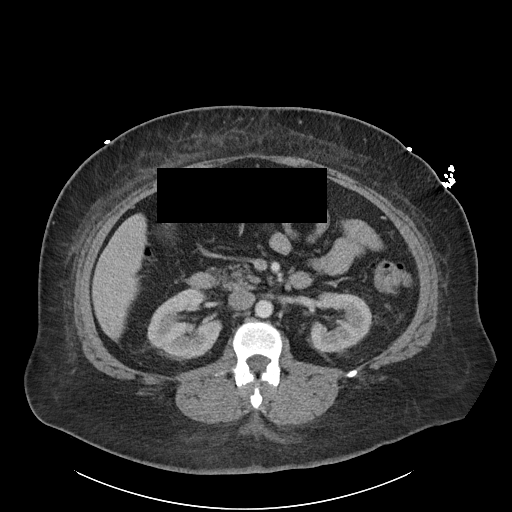
[im 74/130  soft-tissue]
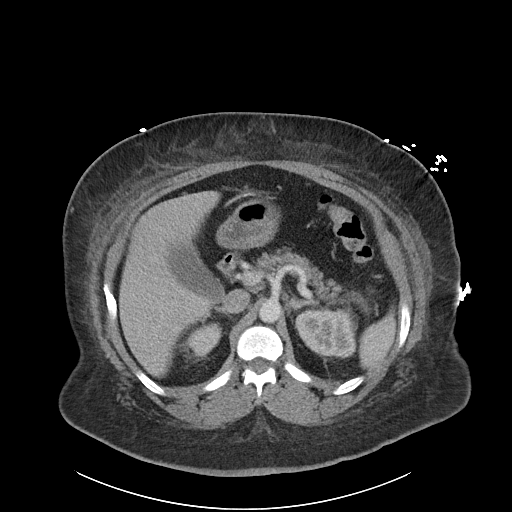
[im 83/130  soft-tissue]
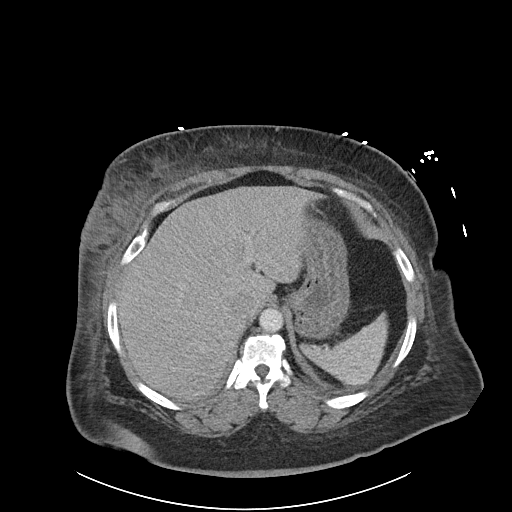
[im 102/130  soft-tissue]
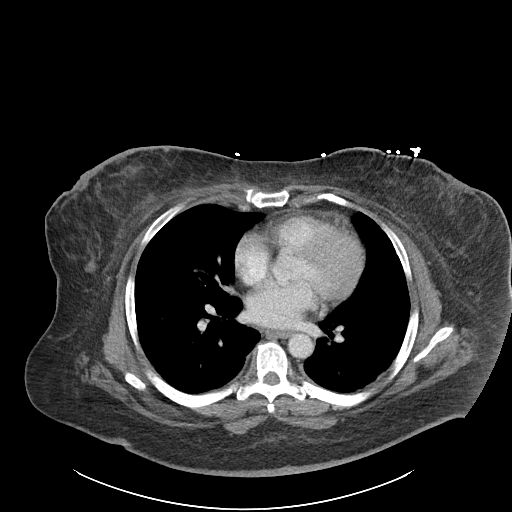
[im 102/130  bone]
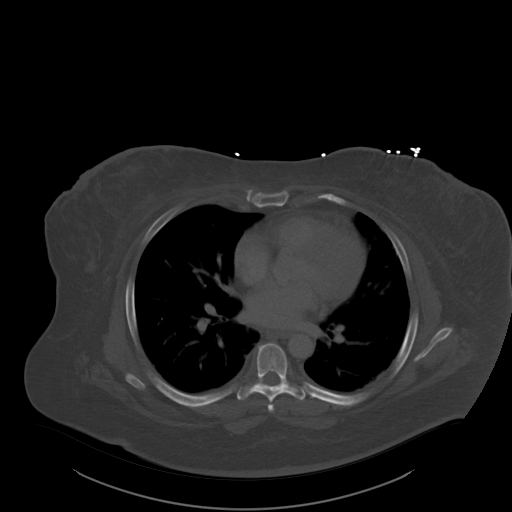
[im 111/130  soft-tissue]
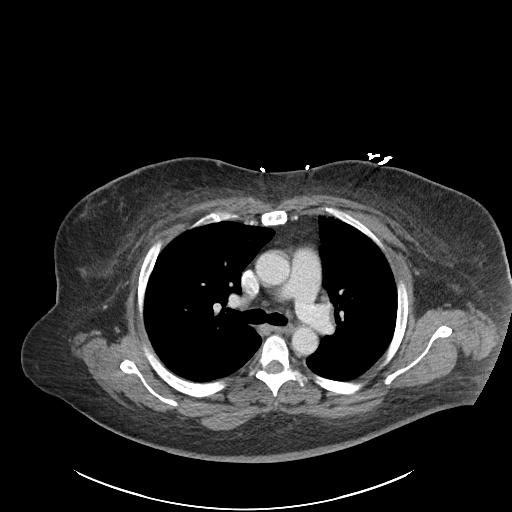
[im 120/130  soft-tissue]
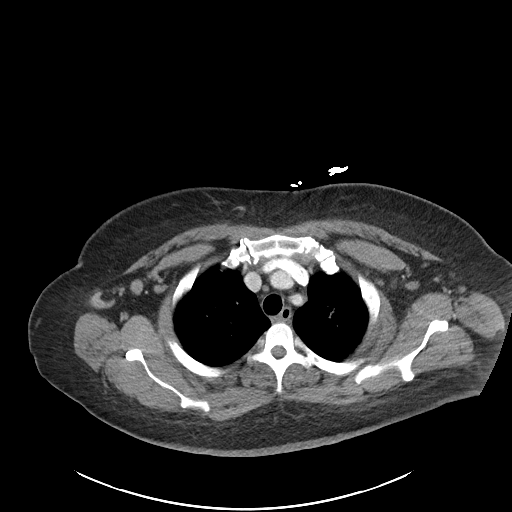

[Series 6: cor · coronal · 0.89mm/px · 3 of 123 slices shown]
[im 41/123  soft-tissue]
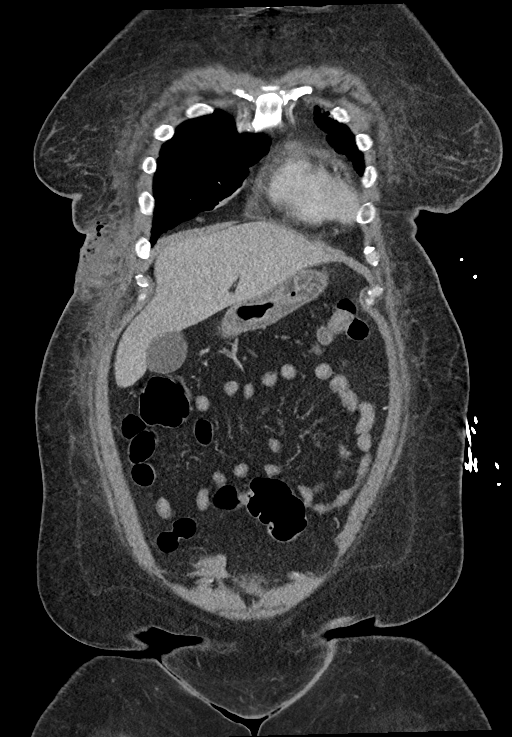
[im 55/123  soft-tissue]
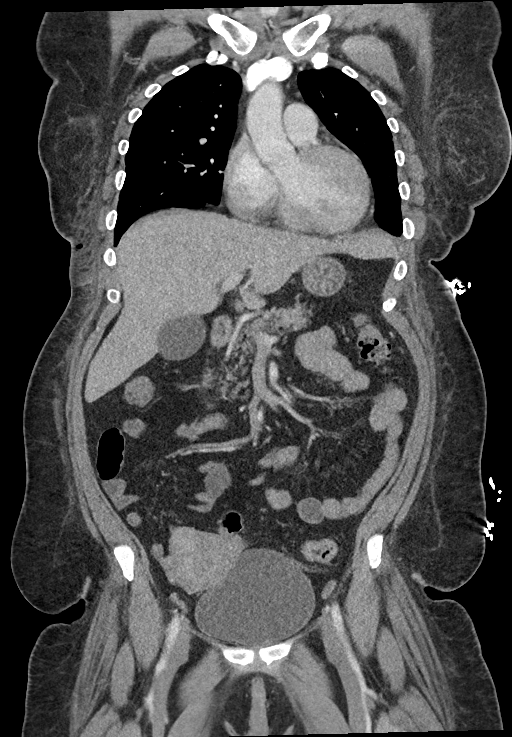
[im 68/123  soft-tissue]
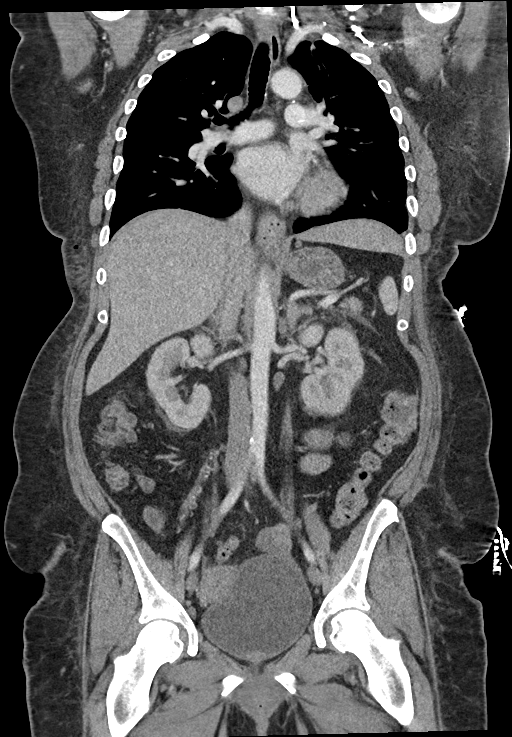

[14 of 46 positions shown; findings below may reference images not displayed]

FINDINGS: CT CHEST FINDINGS

Cardiovascular: Nonaneurysmal aorta. Normal cardiac size. No
pericardial effusion.

Mediastinum/Nodes: Midline trachea. No thyroid mass. No suspicious
mediastinal lymph nodes. Enlarged right axillary lymph nodes
measuring up to 15 mm. Esophagus within normal limits.

Lungs/Pleura: Left upper lobe bronchiectasis with associated
stellate density at left apex, no significant change since 1089 and
presumably scarring. Other areas of scarring are visualized in the
left upper lobe/lingula. 23 posterior pleural thickening with
possible mild calcification on the left suggestive of pleural
plaques.

Musculoskeletal: Sternum is intact. Thoracic vertebral bodies
demonstrate normal stature.

Subcutaneous edema within the right greater than left chest wall.
Gas and fluid collection within the right anterolateral chest wall,
inferior to the right breast, this measures approximately 3.8 by
cm and would be consistent with soft tissue abscess. Moderate gas
tracking within the right lateral chest wall soft tissues
posteriorly. Edema and soft tissue stranding extend to the right
upper quadrant abdominal wall subcutaneous fat.

CT ABDOMEN PELVIS FINDINGS

Hepatobiliary: No focal liver abnormality is seen. No gallstones,
gallbladder wall thickening, or biliary dilatation.

Pancreas: Unremarkable. No pancreatic ductal dilatation or
surrounding inflammatory changes.

Spleen: Normal in size without focal abnormality.

Adrenals/Urinary Tract: Adrenal glands are unremarkable. Kidneys are
normal, without renal calculi, focal lesion, or hydronephrosis.
Bladder is unremarkable.

Stomach/Bowel: Stomach nonenlarged. No dilated small bowel. No acute
bowel wall thickening. Negative appendix.

Vascular/Lymphatic: Mild atherosclerosis. No aneurysm. No suspicious
nodes

Reproductive: Lobulated uterus with probable fibroids. Uterus is
deviated to the right pelvis. [DATE] by 2 cm low-density left adnexal
lesion, not entirely consistent with simple cyst.

Other: Negative for pelvic effusion or free air.

Musculoskeletal: No acute or suspicious osseous abnormality.
Advanced degenerative changes at L5-S1.
IMPRESSION: 1. Edema and soft tissue stranding within the right greater than
left mid to lower chest wall with evidence for a 3.8 x 2.9 cm gas
and fluid collection in the subcutaneous fat consistent with soft
tissue abscess. Moderate gas tracking posteriorly along the right
lateral chest wall. Soft tissue inflammatory changes extend to the
right upper quadrant anterior abdominal wall.
2. Mild right axillary adenopathy, likely reactive
3. No CT evidence for acute intra-abdominal or pelvic abnormality.
Fibroid uterus.
4. 3.8 cm low-density indeterminate left adnexal lesion. Suggest
correlation with non emergent pelvic ultrasound.

## 2023-11-20 IMAGING — DX DG CHEST 1V PORT
1 series · 1 of 1 positions shown · non-contrast
Comparison: February 09, 2020

CLINICAL DATA: Questionable sepsis - evaluate for abnormality

EXAM:
PORTABLE CHEST 1 VIEW

[chest]
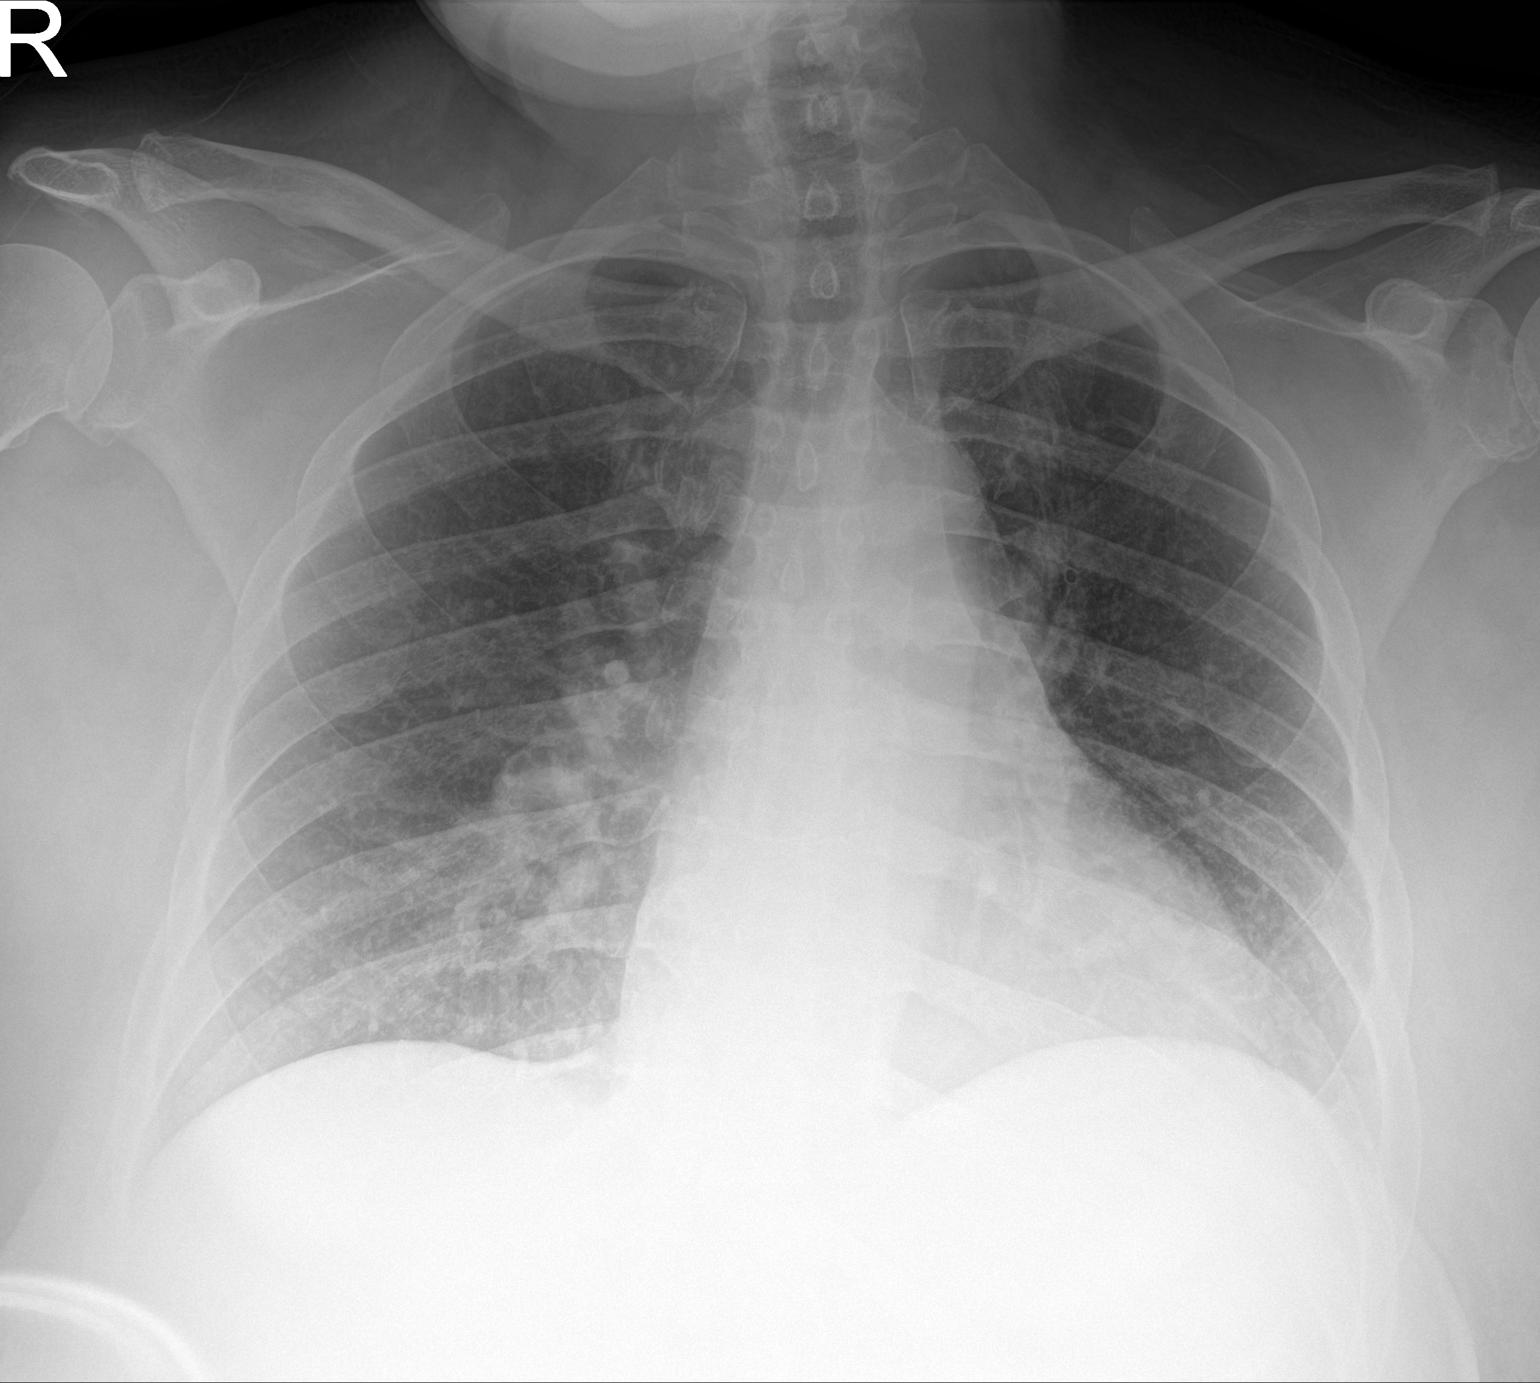

[1 of 1 positions shown; findings below may reference images not displayed]

FINDINGS: The cardiomediastinal silhouette is unchanged in contour. No pleural
effusion. No pneumothorax. No acute pleuroparenchymal abnormality.
Visualized abdomen is unremarkable.
IMPRESSION: No acute cardiopulmonary abnormality.

## 2023-11-23 IMAGING — US US RENAL
1 series · 14 of 25 positions shown · non-contrast
Comparison: 04/16/2021 CT chest, abdomen and pelvis

CLINICAL DATA: Acute kidney injury, inpatient

EXAM:
RENAL / URINARY TRACT ULTRASOUND COMPLETE

[Series 1: us renal · 14 of 57 slices shown]
[im 1/57]
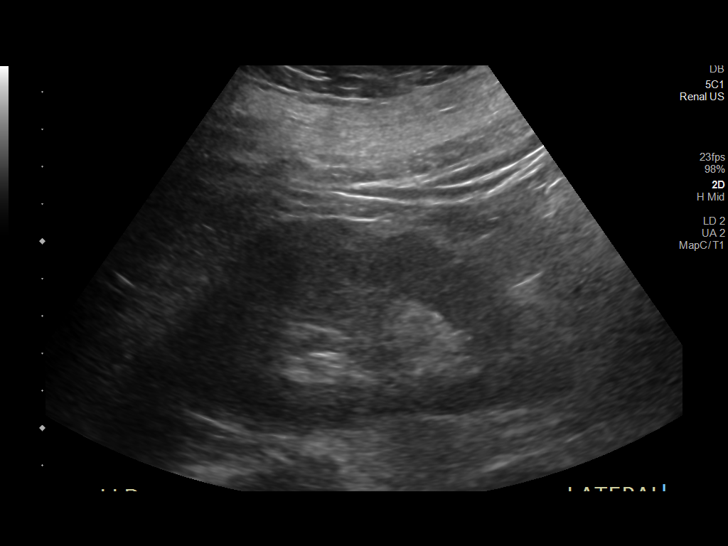
[im 5/57]
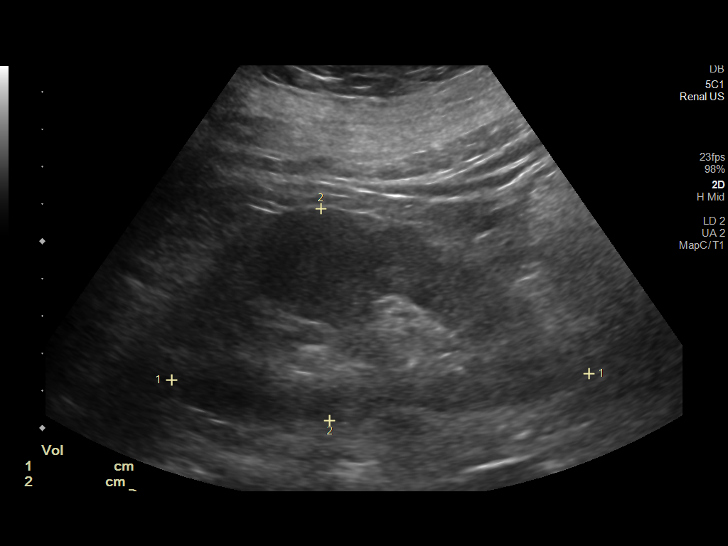
[im 10/57]
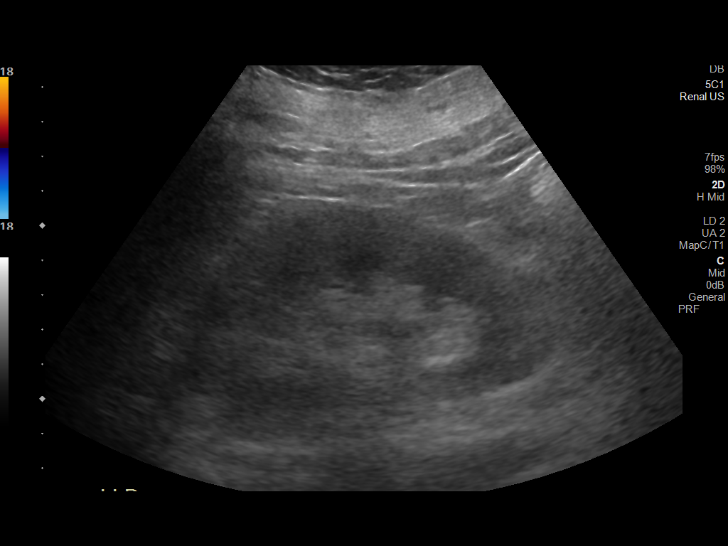
[im 15/57]
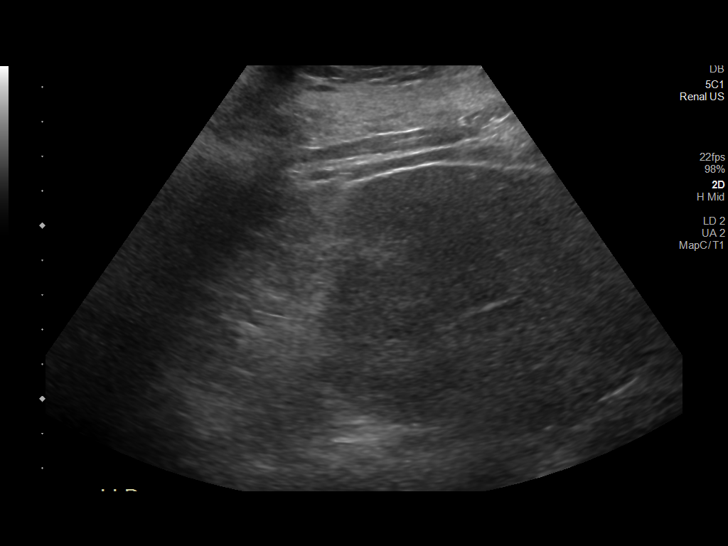
[im 19/57]
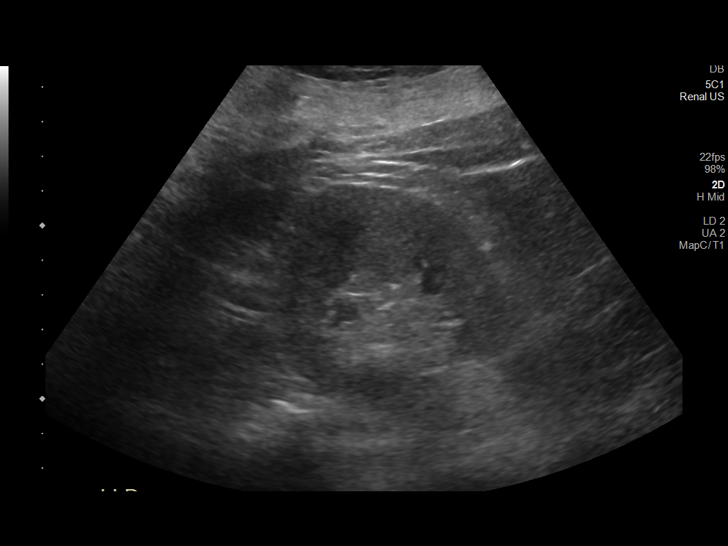
[im 22/57]
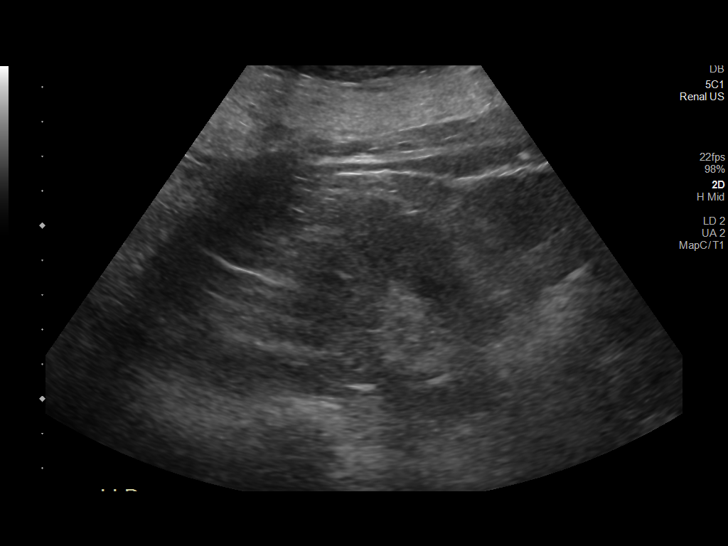
[im 26/57]
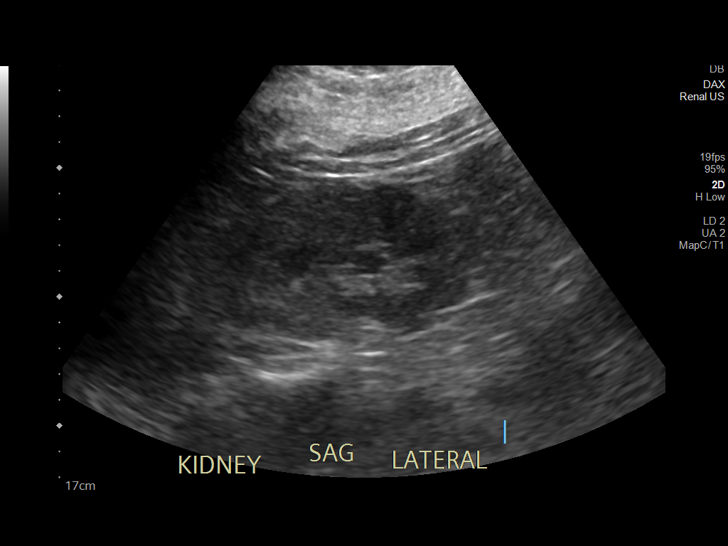
[im 31/57]
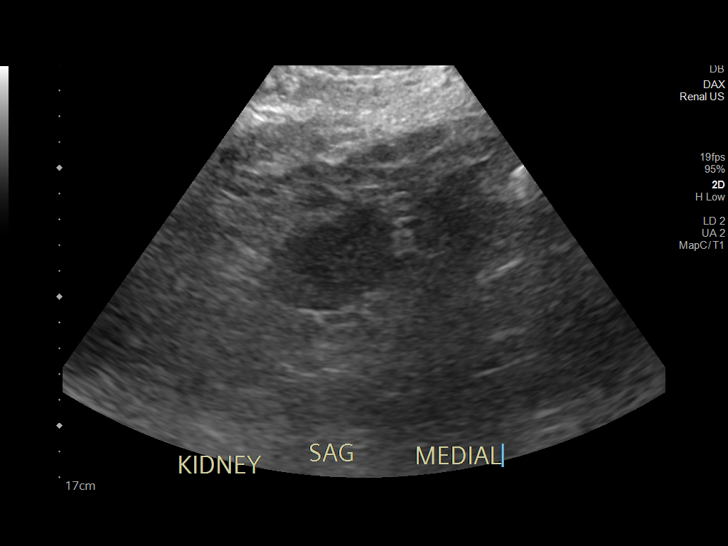
[im 36/57]
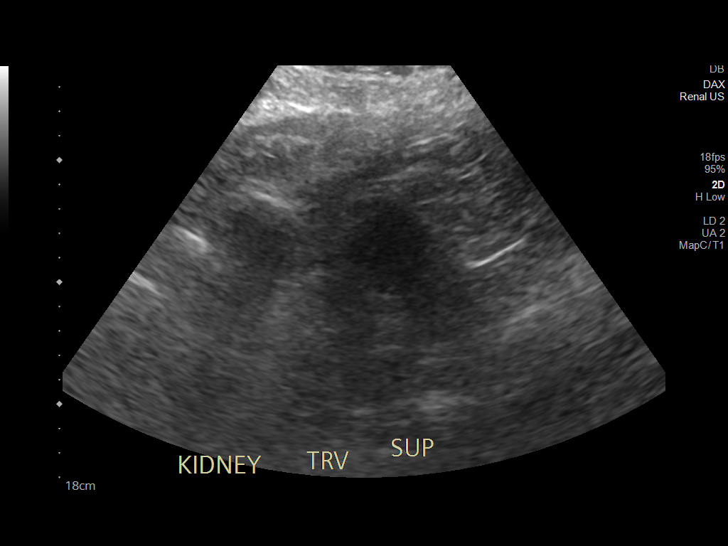
[im 38/57]
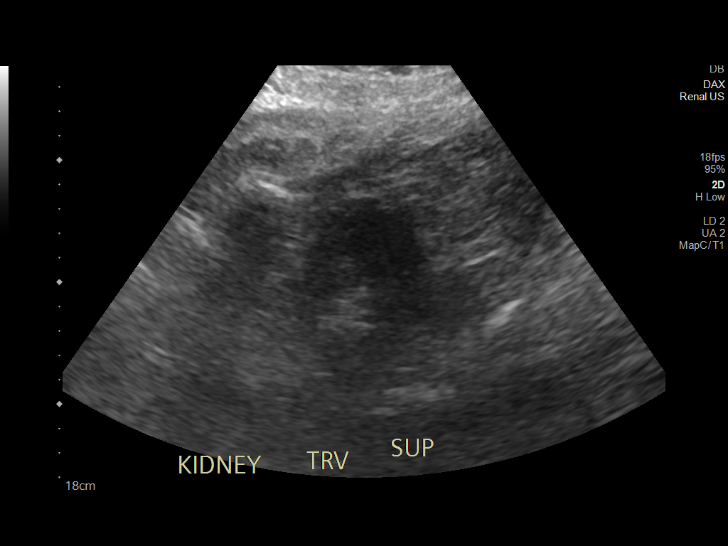
[im 43/57]
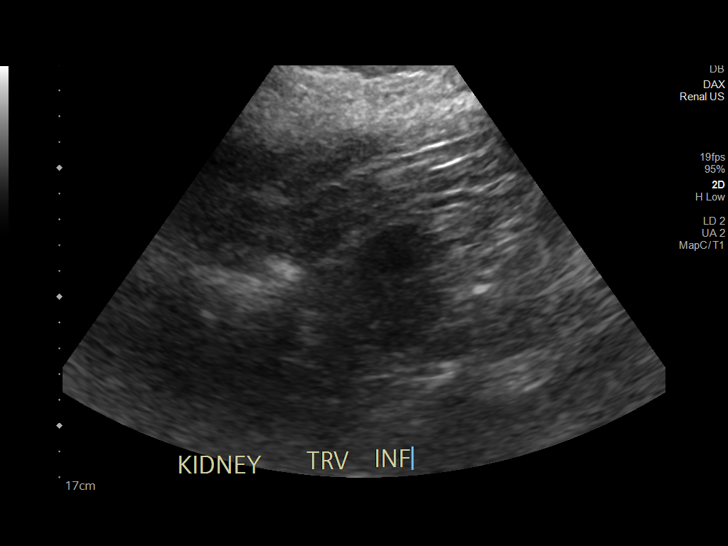
[im 47/57]
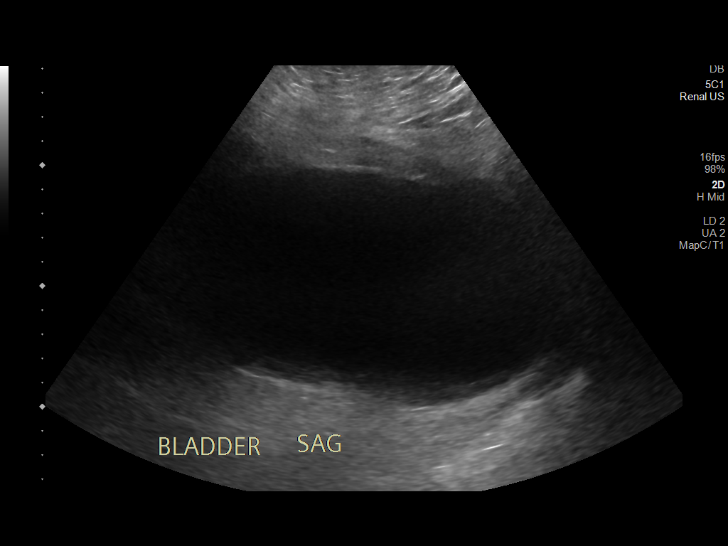
[im 52/57]
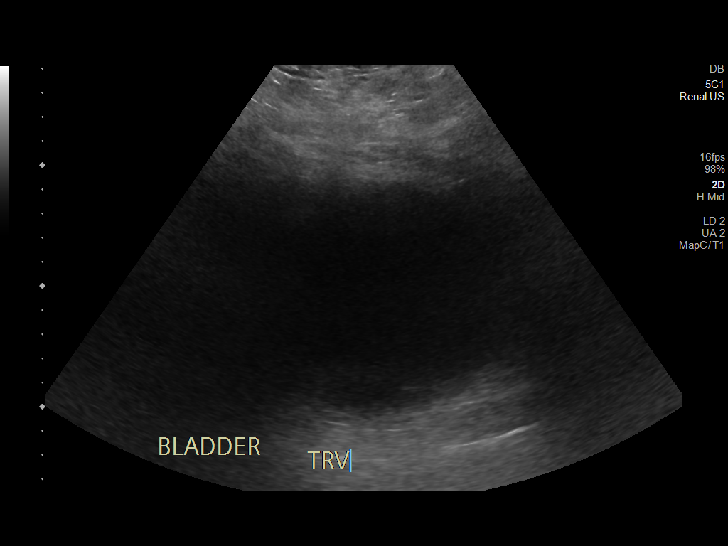
[im 57/57]
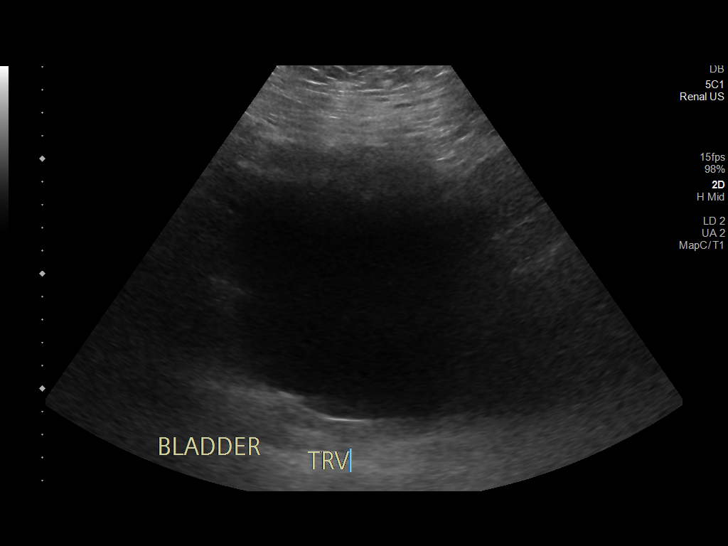

[14 of 25 positions shown; findings below may reference images not displayed]

FINDINGS: Right Kidney:

Renal measurements: 11.2 x 5.7 x 6.8 cm = volume: 227 mL. Mildly
echogenic right renal parenchyma, normal thickness. No
hydronephrosis. No convincing shadowing renal stones. No renal
masses.

Left Kidney:

Renal measurements: 10.7 x 6.4 x 5.2 cm = volume: 184 mL. Mildly
echogenic left renal parenchyma, though. No hydronephrosis. No renal
stones or masses.

Bladder:

Appears normal for degree of bladder distention.

Other:

None.
IMPRESSION: 1. No hydronephrosis.
2. Echogenic kidneys, compatible with nonspecific acute renal
parenchymal disease.
3. Normal bladder.

## 2023-11-24 IMAGING — DX DG CHEST 1V
1 series · 1 of 1 positions shown · non-contrast
Comparison: Chest x-ray and chest CT 04/16/2021

CLINICAL DATA: Shortness of breath and dry cough.

EXAM:
CHEST  1 VIEW

[chest ap]
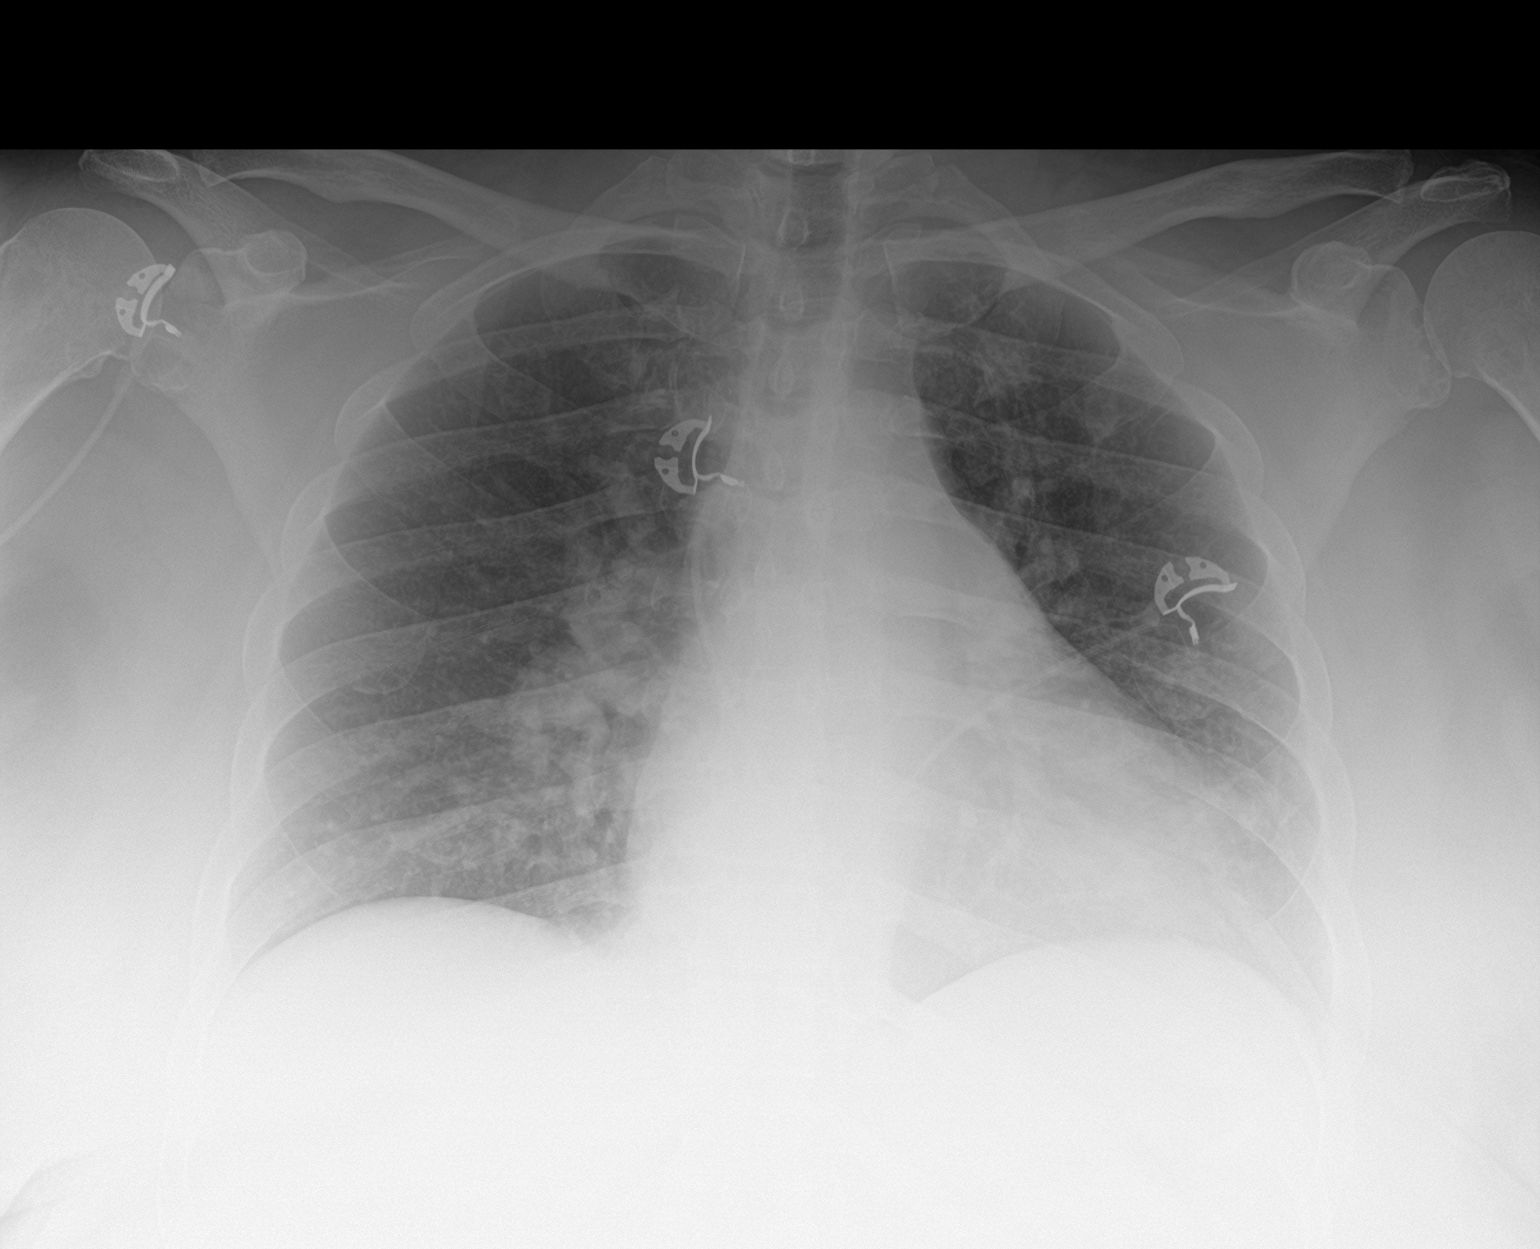

[1 of 1 positions shown; findings below may reference images not displayed]

FINDINGS: The cardiac silhouette, mediastinal and hilar contours are within
normal limits. Patchy left basilar density could be atelectasis or
early infiltrate. No pleural effusions.
IMPRESSION: Patchy left basilar density, atelectasis versus early infiltrate.

## 2023-11-24 IMAGING — CT CT HIP*R* W/O CM
2 of 3 series · 17 of 46 positions shown, 19 images · non-contrast
Comparison: None.

CLINICAL DATA: Right hip pain.

EXAM:
CT OF THE RIGHT HIP WITHOUT CONTRAST
TECHNIQUE: Multidetector CT imaging of the right hip was performed according to
the standard protocol. Multiplanar CT image reconstructions were
also generated.

[Series 5: hip 2.0 st · axial · 0.36mm/px · z∈[+998,+1148]mm · 14 of 87 slices shown, 16 images]
[im 6/87  soft-tissue]
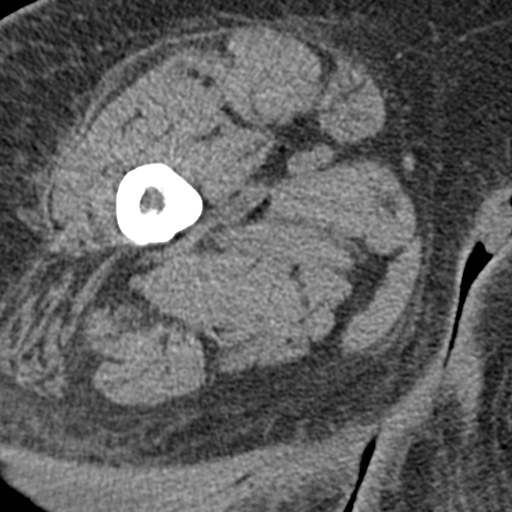
[im 6/87  bone]
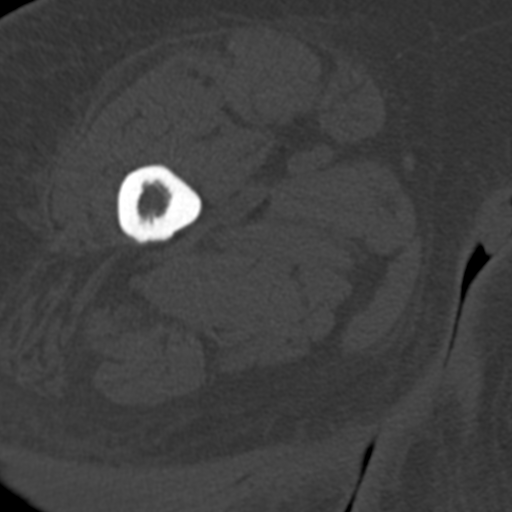
[im 12/87  soft-tissue]
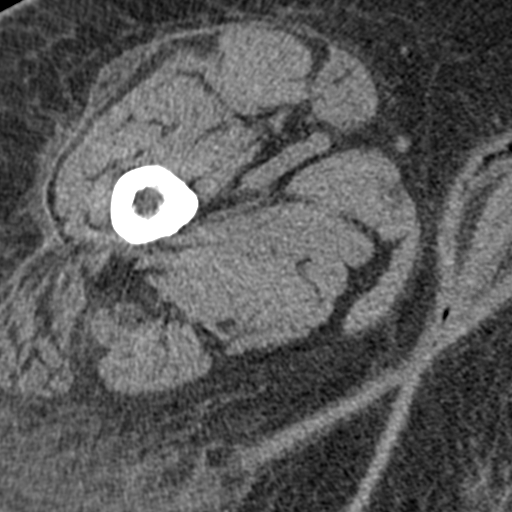
[im 17/87  soft-tissue]
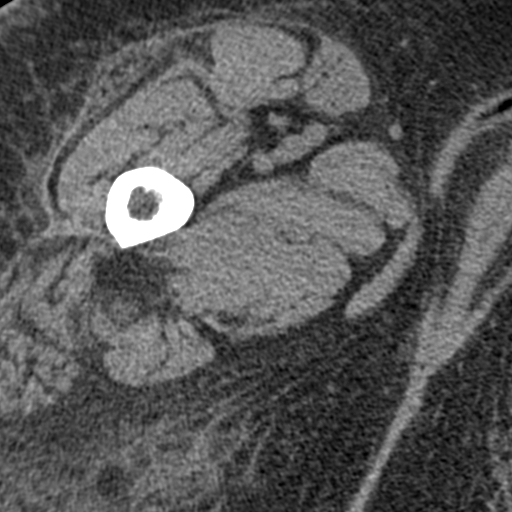
[im 23/87  soft-tissue]
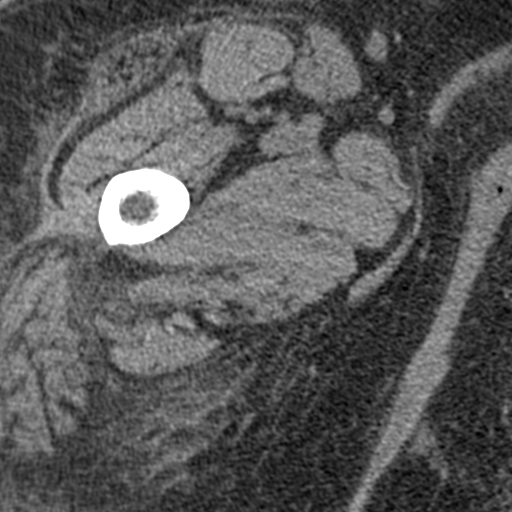
[im 28/87  soft-tissue]
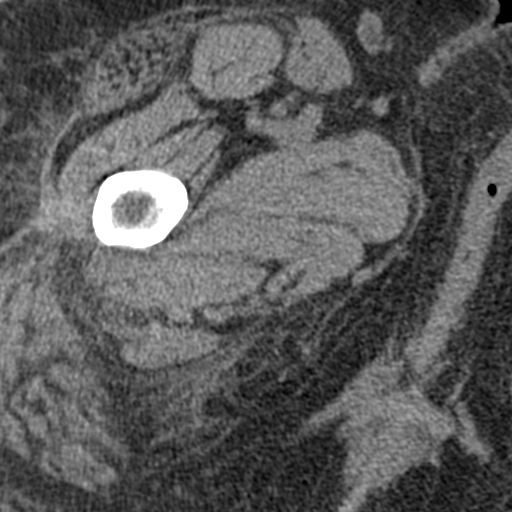
[im 34/87  soft-tissue]
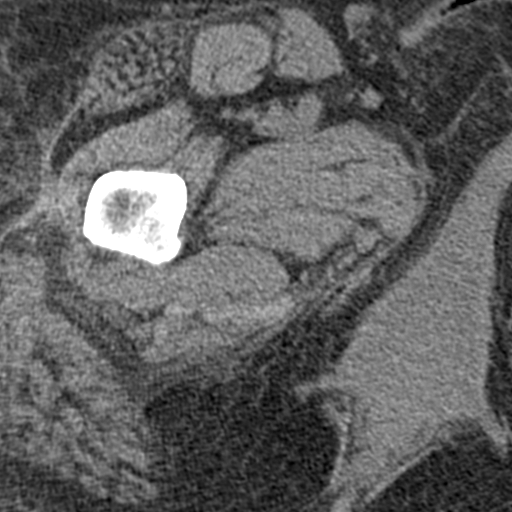
[im 39/87  soft-tissue]
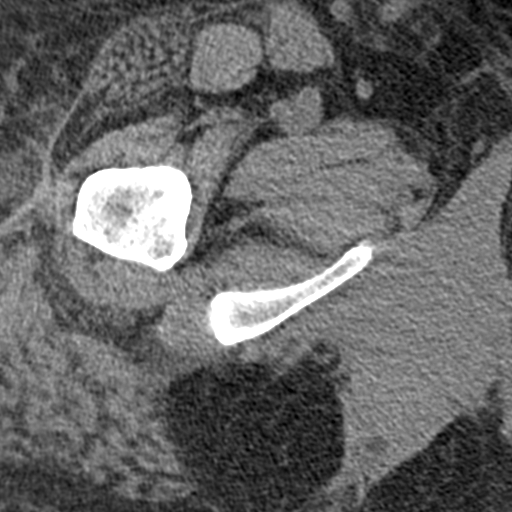
[im 48/87  soft-tissue]
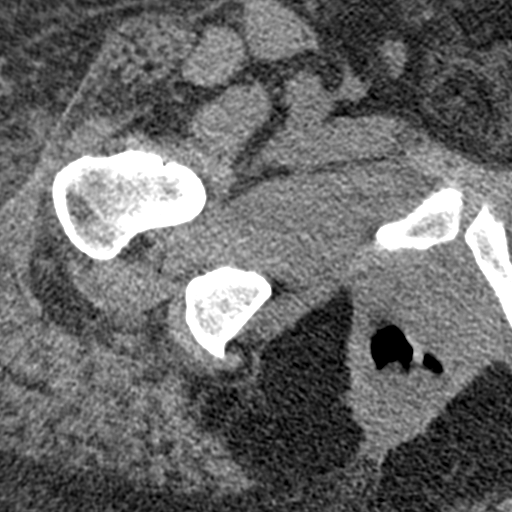
[im 53/87  soft-tissue]
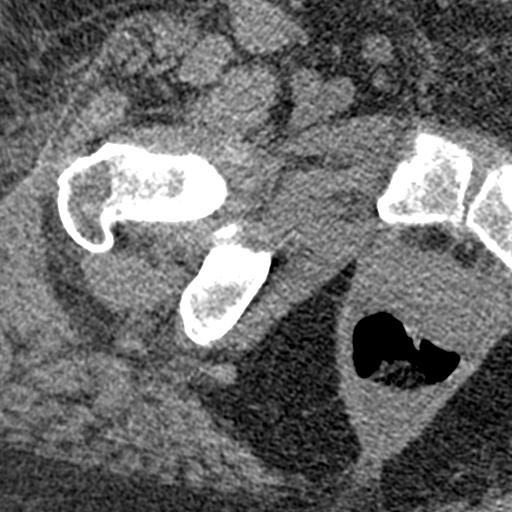
[im 53/87  bone]
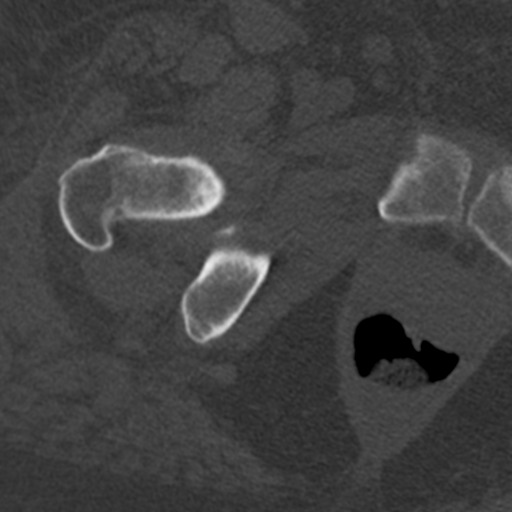
[im 59/87  soft-tissue]
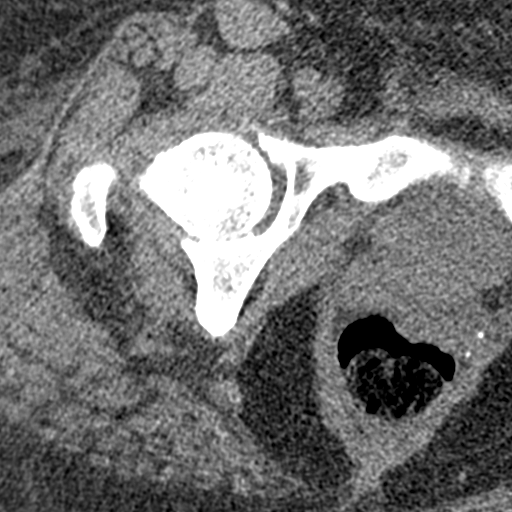
[im 64/87  soft-tissue]
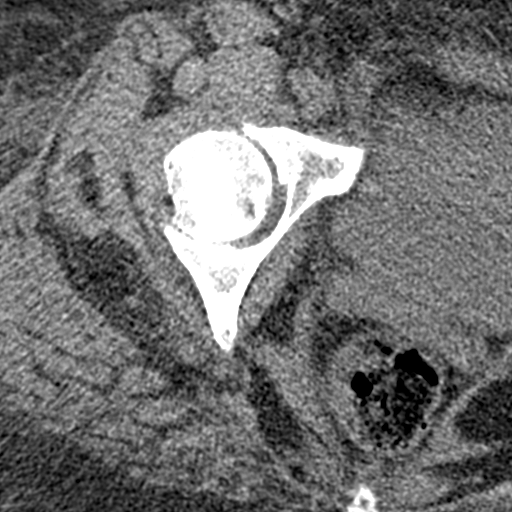
[im 70/87  soft-tissue]
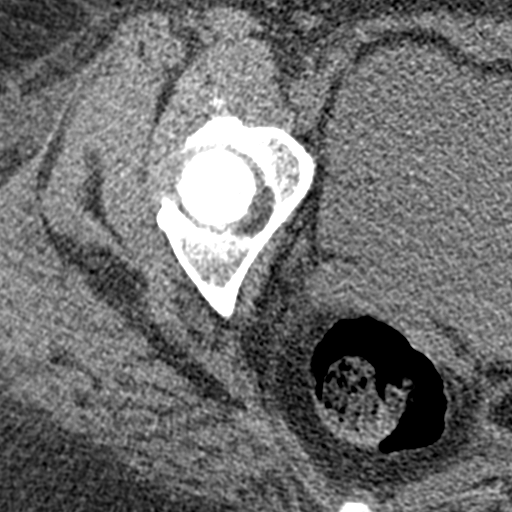
[im 75/87  soft-tissue]
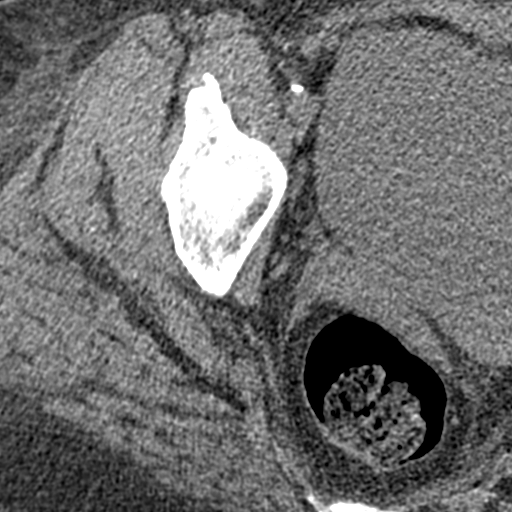
[im 81/87  soft-tissue]
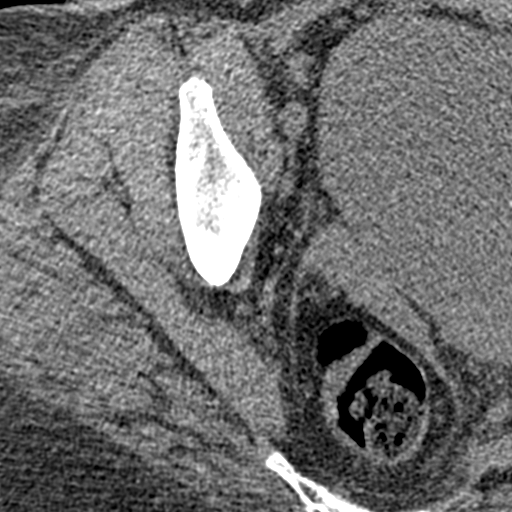

[Series 9: hip 2.0 cor. st · coronal · 0.34mm/px · 3 of 85 slices shown]
[im 29/85  soft-tissue]
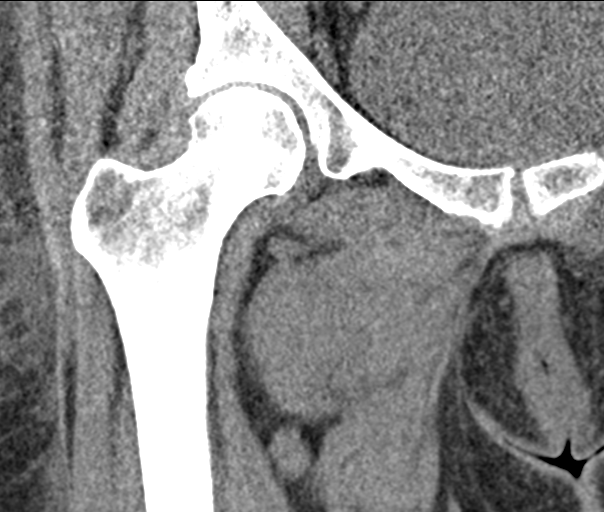
[im 38/85  soft-tissue]
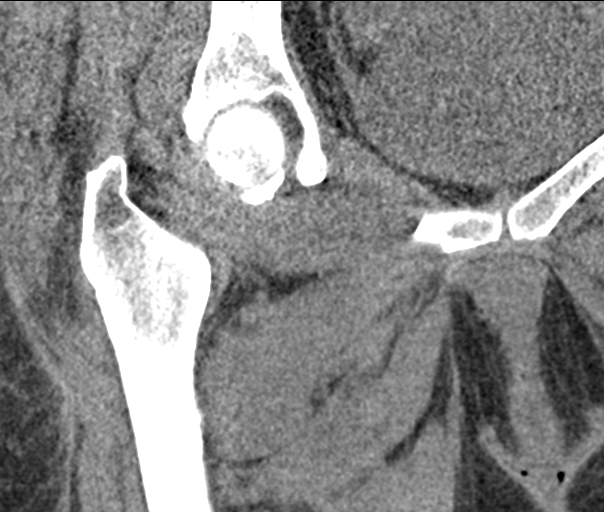
[im 47/85  soft-tissue]
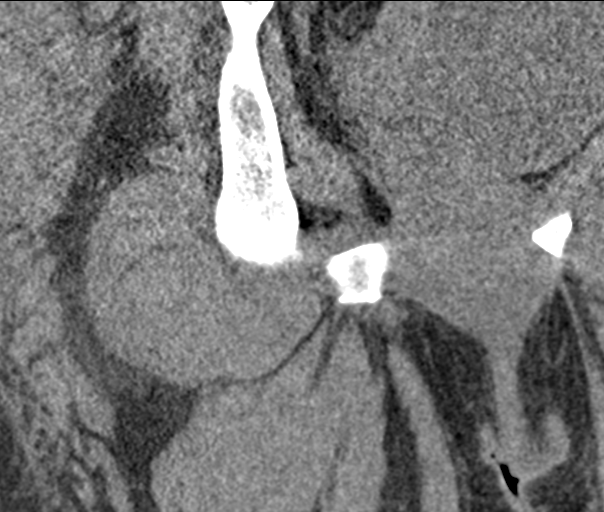

[17 of 46 positions shown; findings below may reference images not displayed]

FINDINGS: The right hip is normally located. Mild degenerative changes but no
fracture or AVN. The visualized right hemipelvic bony structures are
intact.

Nonspecific inflammatory changes are noted in the subcutaneous fat
surrounding the right hip and pelvis. No obvious hip joint effusion.
No focal fluid collections. The hip and pelvic musculature are
grossly normal by CT. No obvious muscle tear or intramuscular
hematoma.

No significant intrapelvic findings are identified
IMPRESSION: 1. Nonspecific inflammatory changes in the subcutaneous fat
surrounding the right hip and pelvis. No focal fluid collections.
2. No acute bony findings or worrisome bone lesions.

## 2023-11-25 IMAGING — US US SOFT TISSUE
1 series · 14 of 16 positions shown · non-contrast
Comparison: None.

CLINICAL DATA: Superficial soft tissue swelling in the left upper
abdomen

EXAM:
LIMITED ULTRASOUND OF ABDOMINAL SOFT TISSUES
TECHNIQUE: Ultrasound examination of the abdominal wall soft tissues was
performed in the area of clinical concern.

[Series 1: us chest soft tissue · 14 of 25 slices shown]
[im 1/25]
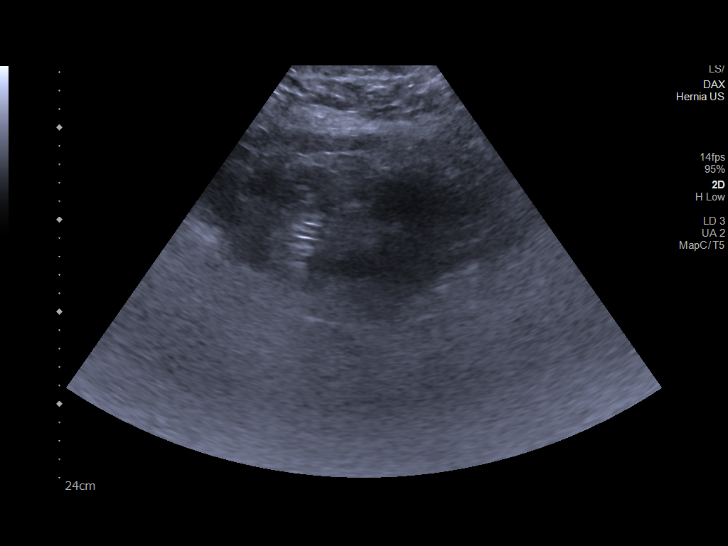
[im 2/25]
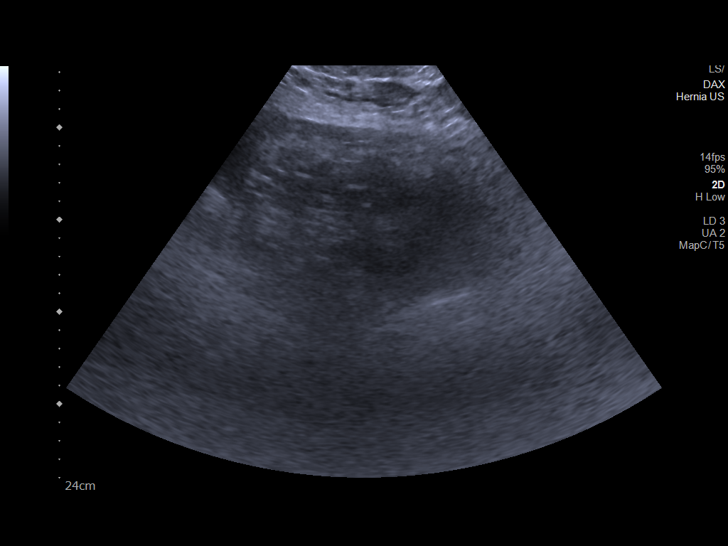
[im 4/25]
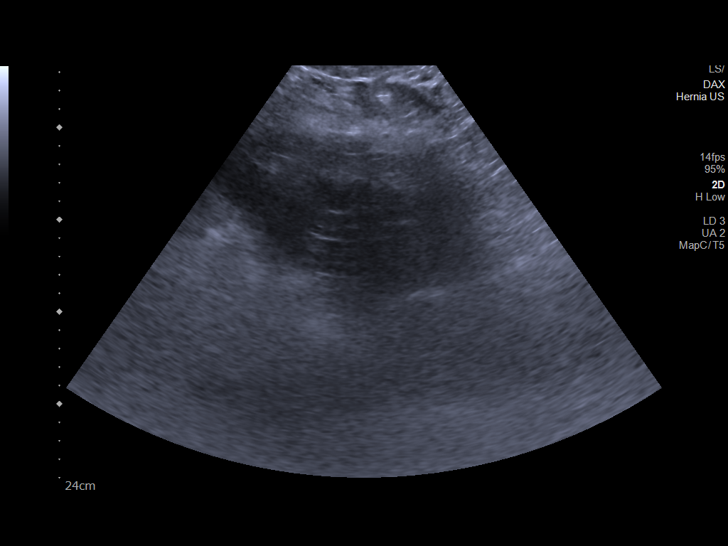
[im 7/25]
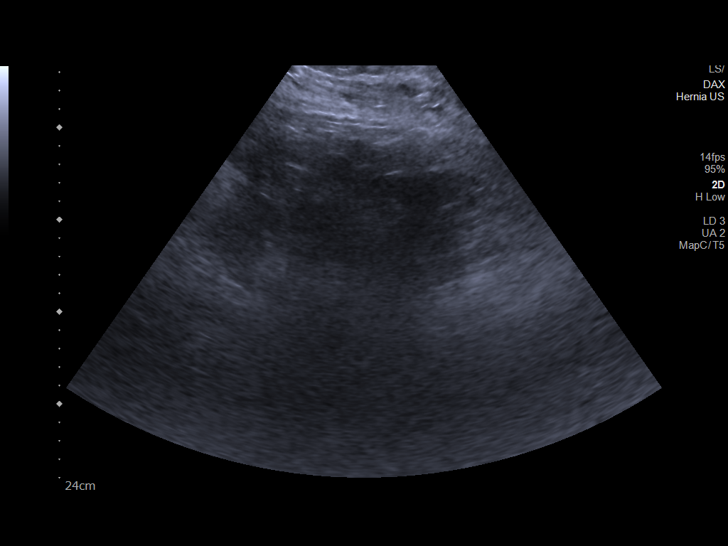
[im 9/25]
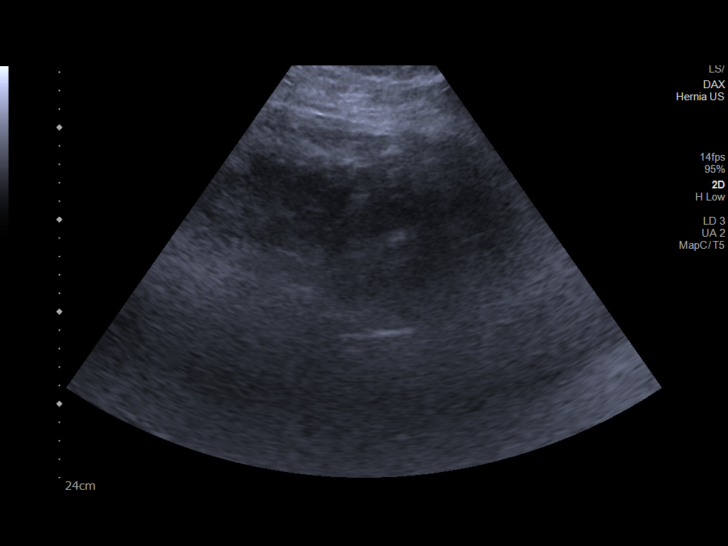
[im 10/25]
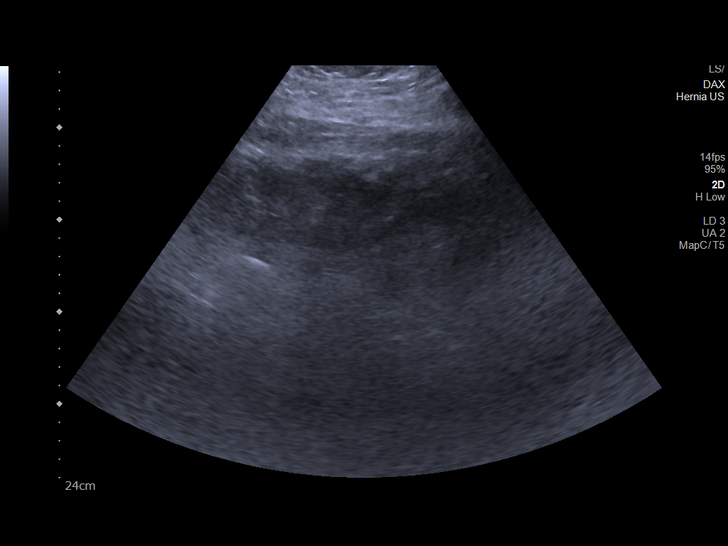
[im 12/25]
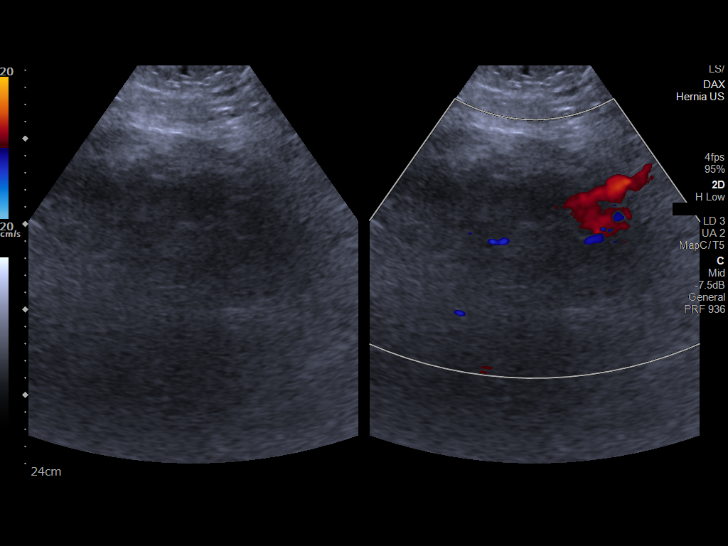
[im 13/25]
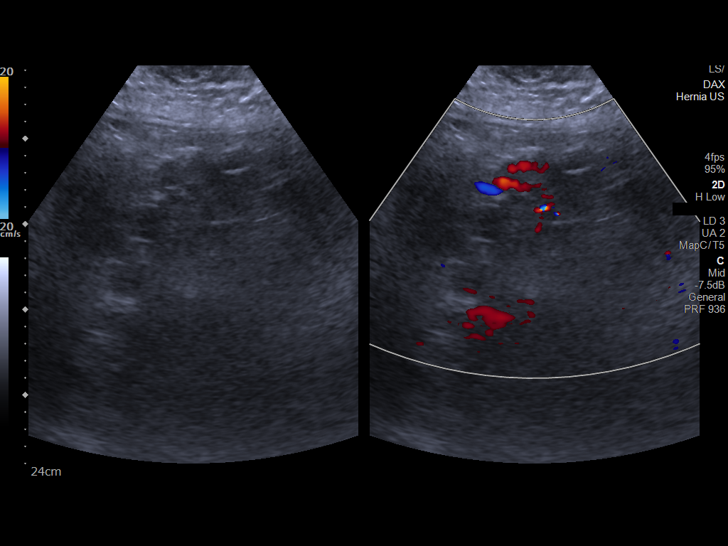
[im 15/25]
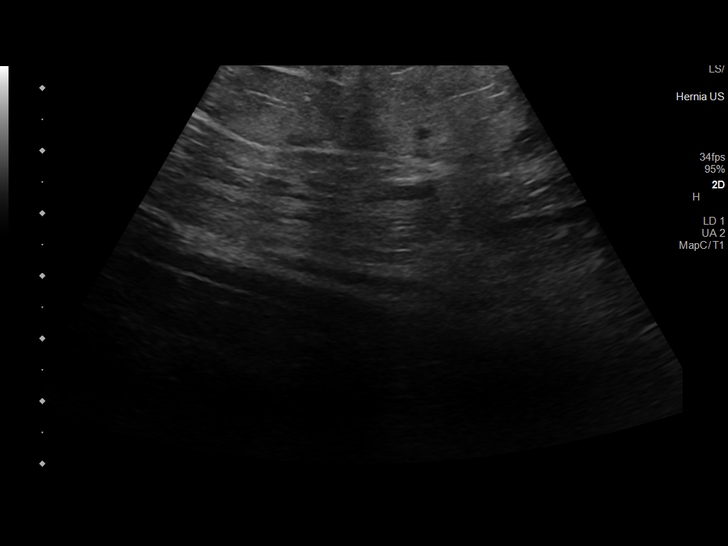
[im 17/25]
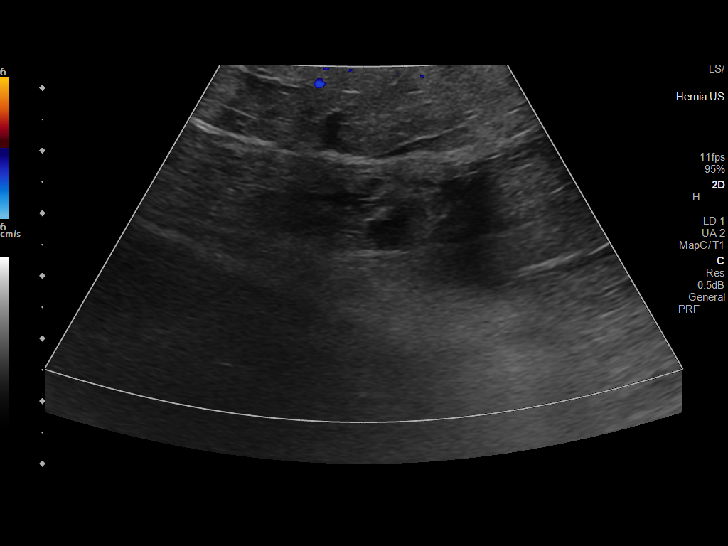
[im 20/25]
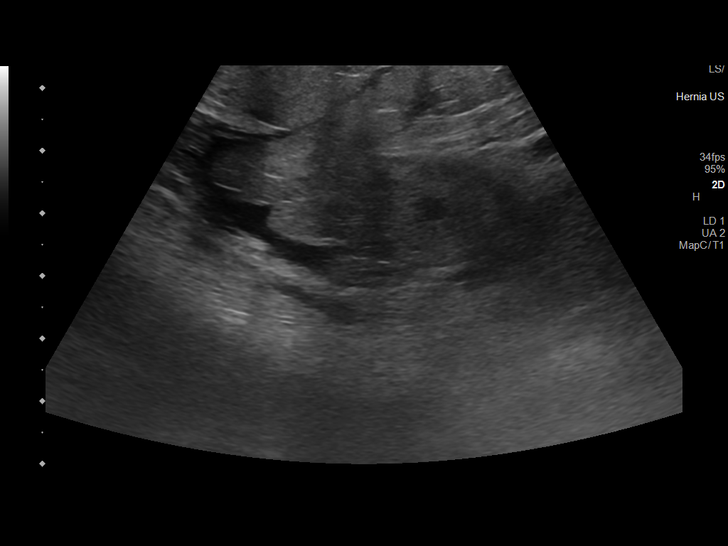
[im 21/25]
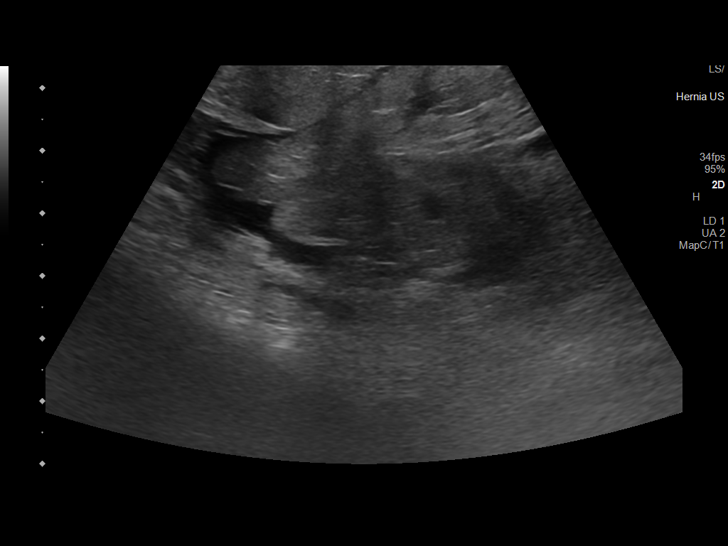
[im 23/25]
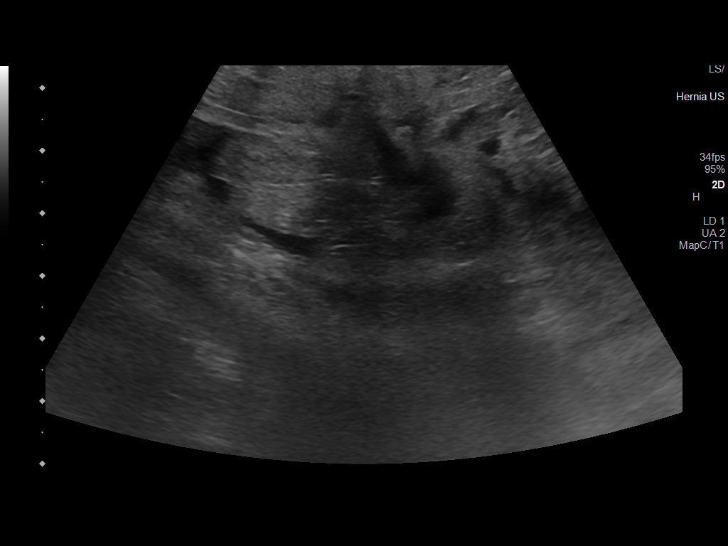
[im 25/25]
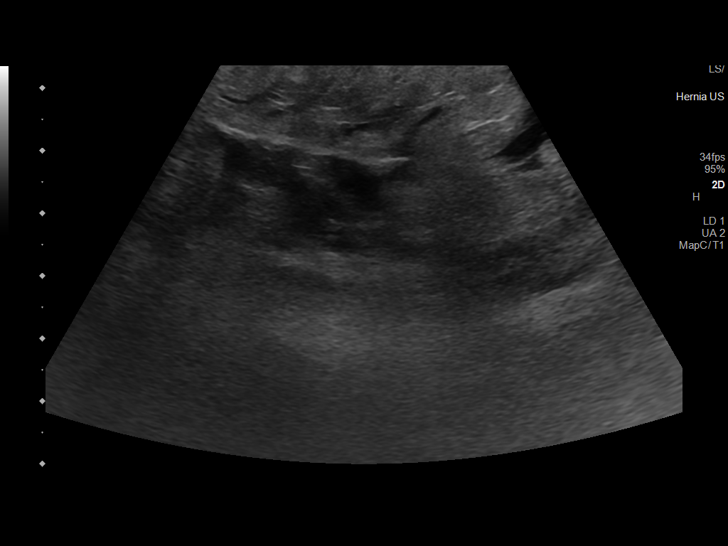

[14 of 16 positions shown; findings below may reference images not displayed]

FINDINGS: Ultrasound images limited to the area of abnormality in the
superficial subcutaneous tissues of the left upper quadrant. In the
area of palpable swelling, edematous changes are demonstrated in the
subcutaneous fatty tissues. Suggestion of possible fat herniation
through a small defect in the fascia. No loculated collections are
seen.
IMPRESSION: Area of superficial soft tissue swelling in the left upper abdomen
appears to correspond to fat herniation and edema. No loculated
collections.

## 2023-11-27 ENCOUNTER — Ambulatory Visit: Attending: Vascular Surgery | Admitting: Physician Assistant

## 2023-11-27 VITALS — BP 151/95 | HR 112 | Temp 97.8°F | Wt 222.8 lb

## 2023-11-27 DIAGNOSIS — N186 End stage renal disease: Secondary | ICD-10-CM

## 2023-11-27 DIAGNOSIS — L7634 Postprocedural seroma of skin and subcutaneous tissue following other procedure: Secondary | ICD-10-CM

## 2023-11-27 DIAGNOSIS — Z992 Dependence on renal dialysis: Secondary | ICD-10-CM | POA: Diagnosis not present

## 2023-11-27 NOTE — Progress Notes (Signed)
 Office Note   History of Present Illness   Shirley [name] is a 48 y.o. (February 12, 1976) female who presents as a triage visit.  She has a history of left upper arm AV graft placement on 08/01/2023 by Dr. Gretta.   She returns today as a triage visit.  She says that her left arm AV graft has been working well at dialysis.  She recently had her right IJ TDC removed by IR on 11/13/2023.  She says 2 days after she had her catheter removed she developed some swelling directly above the catheter exit site.  Her swelling increased by the fourth day after catheter removal.  She says that her swelling has not increased or decreased since then.  She denies any tenderness to the area, redness, or pain.  She also denies any fevers.  She says the area of swelling just feels slightly uncomfortable due to pressure.  Current Outpatient Medications  Medication Sig Dispense Refill   albuterol  (PROVENTIL ) (2.5 MG/3ML) 0.083% nebulizer solution Take 2.5 mg by nebulization every 6 (six) hours as needed for wheezing or shortness of breath.     albuterol  (VENTOLIN  HFA) 108 (90 Base) MCG/ACT inhaler Inhale 2 puffs into the lungs every 4 (four) hours as needed for wheezing or shortness of breath. 8 g 1   amLODipine  (NORVASC ) 10 MG tablet Take 10 mg by mouth daily.     amLODipine  (NORVASC ) 5 MG tablet Take 1 tablet (5 mg total) by mouth daily. (Patient not taking: Reported on 08/01/2023) 30 tablet 3   apixaban  (ELIQUIS ) 5 MG TABS tablet Take 1 tablet (5 mg total) by mouth 2 (two) times daily. 60 tablet 3   aspirin  81 MG chewable tablet Chew 81 mg by mouth daily. (Patient not taking: Reported on 08/01/2023)     atorvastatin  (LIPITOR ) 40 MG tablet Take 40 mg by mouth daily. (Patient not taking: Reported on 08/01/2023)     Blood Glucose Monitoring Suppl (ONETOUCH VERIO FLEX SYSTEM) w/Device KIT Use in the morning, at noon, and at bedtime. 1 kit 0   calcium  acetate (PHOSLO ) 667 MG capsule Take 1 capsule (667 mg total) by mouth 3  (three) times daily with meals. (Patient not taking: Reported on 08/01/2023) 90 capsule 3   carvedilol  (COREG ) 25 MG tablet Take 1 tablet (25 mg total) by mouth 2 (two) times daily with a meal. (Patient not taking: Reported on 04/20/2023) 180 tablet 3   cyclobenzaprine  (FLEXERIL ) 10 MG tablet Take 10 mg by mouth 3 (three) times daily as needed for muscle spasms. (Patient not taking: Reported on 08/01/2023)     diphenhydrAMINE  (BENADRYL ) 25 MG tablet Take 25 mg by mouth every 6 (six) hours as needed for allergies.     docusate sodium  (COLACE) 100 MG capsule Take 100 mg by mouth 2 (two) times daily as needed for mild constipation or moderate constipation. (Patient not taking: Reported on 08/01/2023)     famotidine  (PEPCID ) 20 MG tablet Take 20 mg by mouth daily. (Patient not taking: Reported on 08/01/2023)     ferrous sulfate  325 (65 FE) MG tablet Take 325 mg by mouth daily with breakfast. (Patient not taking: Reported on 08/01/2023)     furosemide  (LASIX ) 80 MG tablet Take 80 mg by mouth 2 (two) times daily.     glucose blood (ONETOUCH VERIO) test strip Use as instructed to check blood sugar 3 times daily. Dx codes: E11.8, E11.65 100 each 11   hydrALAZINE  (APRESOLINE ) 100 MG tablet Take 1 tablet (  100 mg total) by mouth every 8 (eight) hours. (Patient not taking: Reported on 08/01/2023) 180 tablet 3   Insulin  Pen Needle (PEN NEEDLES 3/16) 31G X 5 MM MISC 1 Device by Does not apply route 4 (four) times daily - after meals and at bedtime. 100 each 5   INSULIN  SYRINGE .5CC/28G (INS SYRINGE/NEEDLE .5CC/28G) 28G X 1/2 0.5 ML MISC Please provide 1 month supply 100 each 0   isosorbide  mononitrate (IMDUR ) 30 MG 24 hr tablet Take 3 tablets (90 mg total) by mouth daily. (Patient not taking: Reported on 08/01/2023) 30 tablet 3   linaclotide  (LINZESS ) 145 MCG CAPS capsule Take 145 mcg by mouth daily as needed (for constipation). (Patient not taking: Reported on 08/01/2023)     meclizine  (ANTIVERT ) 25 MG tablet Take 1 tablet (25  mg total) by mouth 3 (three) times daily as needed for dizziness. (Patient not taking: Reported on 08/01/2023) 30 tablet 0   metoprolol  succinate (TOPROL -XL) 50 MG 24 hr tablet Take 50 mg by mouth daily. (Patient not taking: Reported on 08/01/2023)     omeprazole  (PRILOSEC) 40 MG capsule Take 40 mg by mouth daily before breakfast.     ondansetron  (ZOFRAN -ODT) 4 MG disintegrating tablet Take 4 mg by mouth every 8 (eight) hours as needed for nausea or vomiting.     OneTouch Delica Lancets 33G MISC Use to check blood sugar 3 times daily. Dx codes: E11.8, E11.65 100 each 11   oxyCODONE -acetaminophen  (PERCOCET) 10-325 MG tablet Take 1 tablet by mouth 3 (three) times daily.     REGLAN  5 MG tablet Take 5 mg by mouth 3 (three) times daily as needed for nausea or vomiting.     senna (SENOKOT) 8.6 MG TABS tablet Take 1 tablet (8.6 mg total) by mouth 2 (two) times daily. (Patient not taking: Reported on 08/01/2023) 30 tablet 0   silver  sulfADIAZINE  (SILVADENE ) 1 % cream Apply 1 Application topically daily. (Patient not taking: Reported on 08/01/2023)     sodium bicarbonate  650 MG tablet Take 1,300 mg by mouth 2 (two) times daily. (Patient not taking: Reported on 08/01/2023)     No current facility-administered medications for this visit.    REVIEW OF SYSTEMS (negative unless checked):   Cardiac:  []  Chest pain or chest pressure? []  Shortness of breath upon activity? []  Shortness of breath when lying flat? []  Irregular heart rhythm?  Vascular:  []  Pain in calf, thigh, or hip brought on by walking? []  Pain in feet at night that wakes you up from your sleep? []  Blood clot in your veins? []  Leg swelling?  Pulmonary:  []  Oxygen  at home? []  Productive cough? []  Wheezing?  Neurologic:  []  Sudden weakness in arms or legs? []  Sudden numbness in arms or legs? []  Sudden onset of difficult speaking or slurred speech? []  Temporary loss of vision in one eye? []  Problems with dizziness?  Gastrointestinal:  []   Blood in stool? []  Vomited blood?  Genitourinary:  []  Burning when urinating? []  Blood in urine?  Psychiatric:  []  Major depression  Hematologic:  []  Bleeding problems? []  Problems with blood clotting?  Dermatologic:  []  Rashes or ulcers?  Constitutional:  []  Fever or chills?  Ear/Nose/Throat:  []  Change in hearing? []  Nose bleeds? []  Sore throat?  Musculoskeletal:  []  Back pain? []  Joint pain? []  Muscle pain?   Physical Examination   Vitals:   11/27/23 1018  BP: (!) 151/95  Pulse: (!) 112  Temp: 97.8 F (36.6 C)  TempSrc: Temporal  Weight: 222 lb 12.8 oz (101.1 kg)   Body mass index is 34.9 kg/m.  General:  WDWN in NAD; vital signs documented above Gait: Not observed HENT: WNL, normocephalic Pulmonary: normal non-labored breathing  Cardiac: regular Abdomen: soft, NT, no masses Skin: Oval-shaped mass at the right chest wall, directly above previous [facility] exit site.  The mass is firm, nonfluctuant, with well-defined borders measuring about 1.5 in long x 1 in wide. Skin is taut overlying the mass. Extremities: left arm AVG with great thrill Musculoskeletal: no muscle wasting or atrophy  Neurologic: A&O X 3;  No focal weakness or paresthesias are detected Psychiatric:  The pt has Normal affect.  Medical Decision Making   Tyauna Lacaze Walker is a 48 y.o. female who presents as a triage visit  The patient presents as a triage visit for an area of swelling over a previous catheter exit site She has a history of left upper arm AV graft placed by our practice earlier this summer.  Her graft is functioning well at dialysis.  Because her graft has been working well, she was cleared for [facility] removal by her nephrologist Her right IJ TDC was removed by IR about a week ago.  She developed an area of swelling over her catheter exit site about 2 days after removal.  Within 4 days of catheter removal, this area of swelling had reached its peak.  She denies any skin changes  over the area, tenderness, fevers, or drainage. On exam she has an oval area of fluid collection at the right chest wall, directly above her previous [facility] exit site.  The skin is taut overlying this area of fluid collection.  There are no skin changes to the area such as erythema or warmth.  She has no tenderness to the area.  This fluid collection is firm and nonfluctuant.  This fluid collection is likely a seroma.  I have low concern for abscess formation given that she has no tenderness to the area, warmth, redness, or fevers.  Potentially a small remainder of her previous TDC cuff was left in her skin after removal, prompting seroma formation. I have encouraged the patient to watch this area of swelling.  I believe that her seroma should go away with time.  Given that there are no signs of infection, there are no indications for drainage of this area She can follow-up with our office as needed   Ahmed Holster PA-C [facility] Office: [phone]  Clinic MD: [facility]

## 2023-11-28 IMAGING — MR MR HIP*R* W/O CM
5 series · 39 of 40 positions shown · non-contrast
Comparison: CT scan 04/20/2021

CLINICAL DATA: Right hip pain for 1 week.

EXAM:
MR OF THE RIGHT HIP WITHOUT CONTRAST
TECHNIQUE: Multiplanar, multisequence MR imaging was performed. No intravenous
contrast was administered.

[Series 12: T1 · coronal · right · 4.0mm · 1.17mm/px · 8 of 35 slices shown]
[im 1/35]
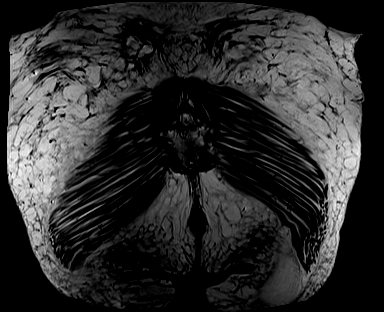
[im 5/35]
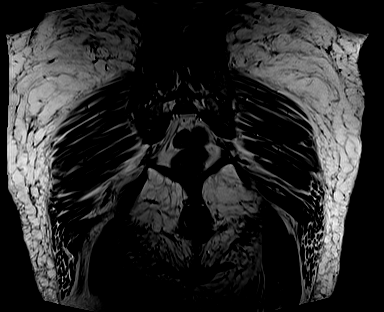
[im 9/35]
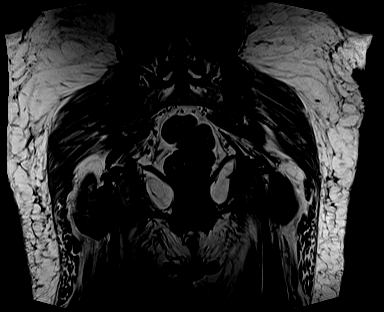
[im 13/35]
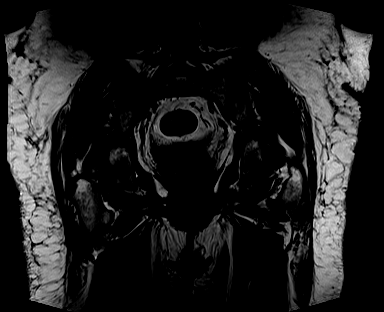
[im 22/35]
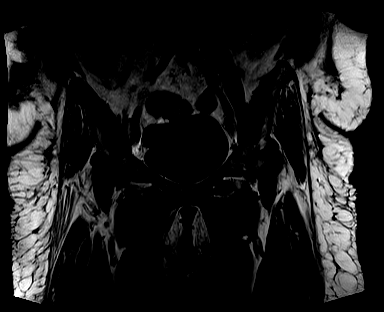
[im 26/35]
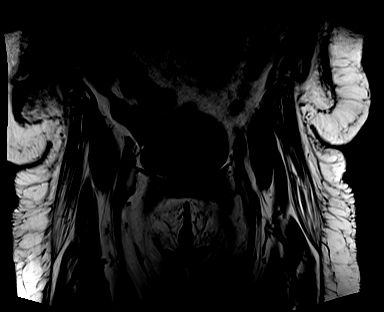
[im 30/35]
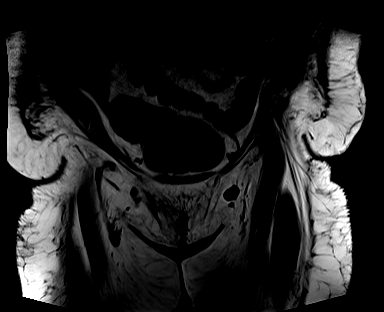
[im 35/35]
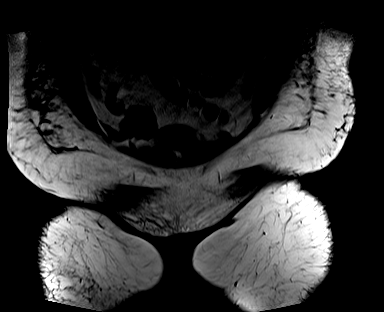

[Series 13: T2 fat-sat · coronal · right · 4.0mm · 1.00mm/px · 9 of 35 slices shown (1 of 2)]
[im 1/35]
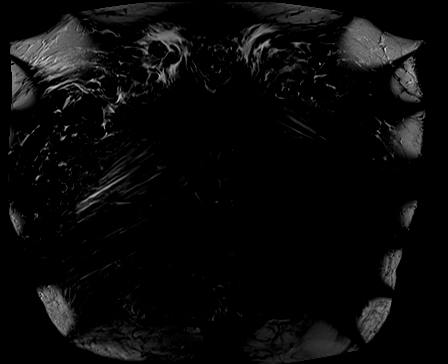
[im 5/35]
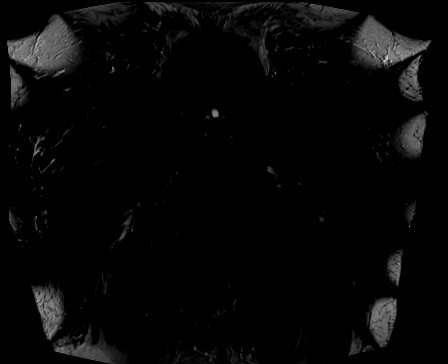
[im 9/35]
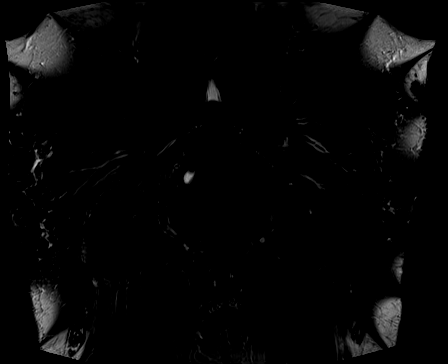
[im 13/35]
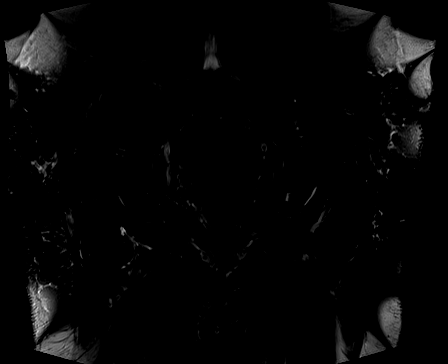
[im 18/35]
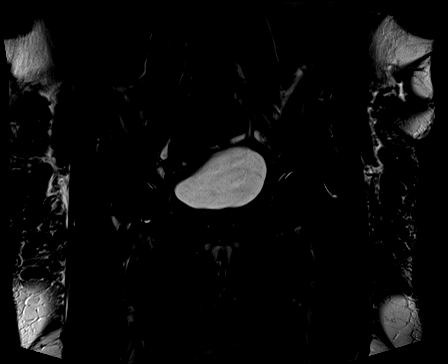
[im 22/35]
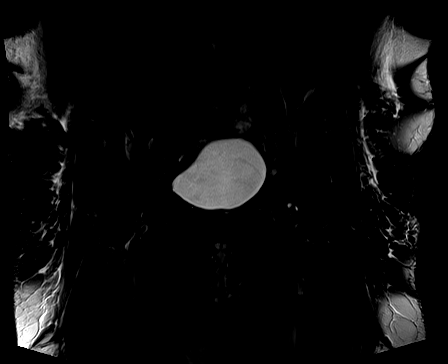
[im 26/35]
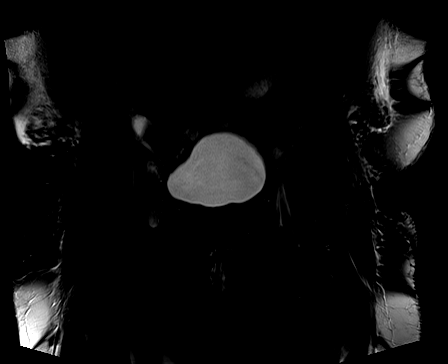
[im 30/35]
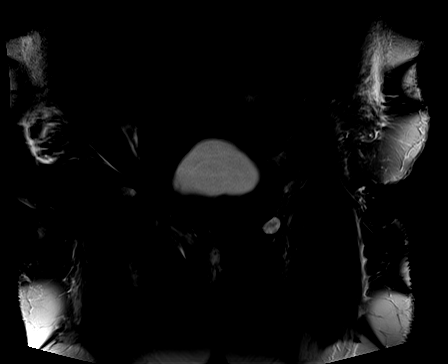
[im 35/35]
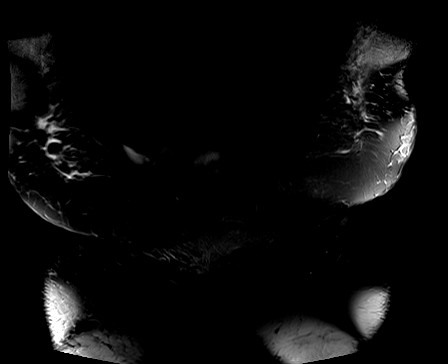

[Series 14: T2 fat-sat · axial · right · 4.0mm · 1.00mm/px · z∈[-175,+15]mm · 10 of 39 slices shown (2 of 2)]
[im 1/39]
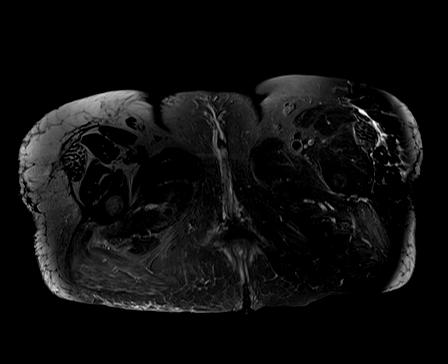
[im 5/39]
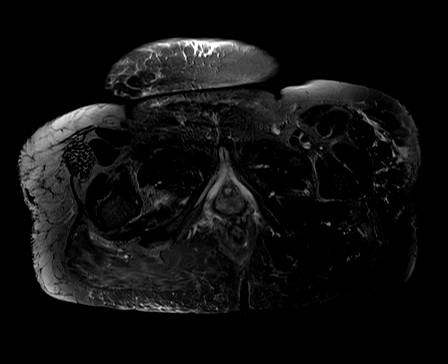
[im 9/39]
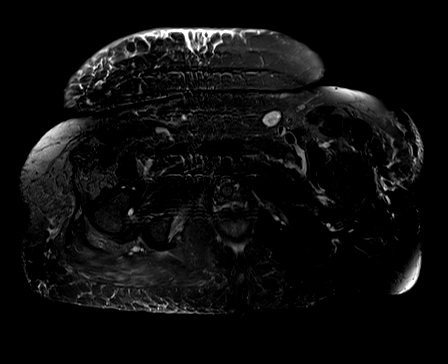
[im 13/39]
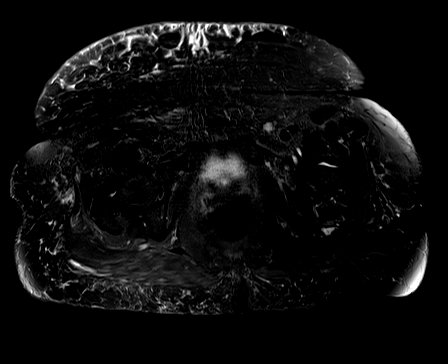
[im 17/39]
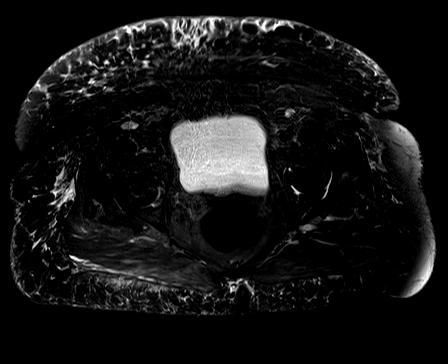
[im 22/39]
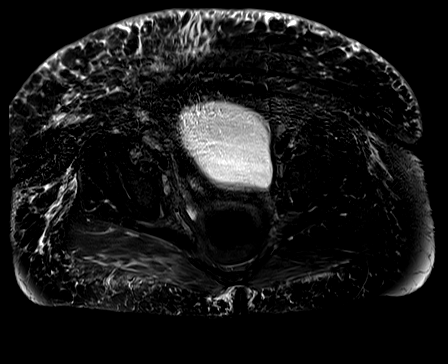
[im 26/39]
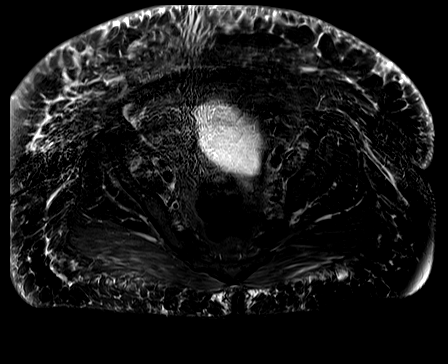
[im 30/39]
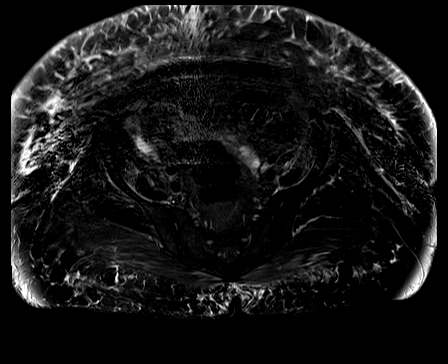
[im 34/39]
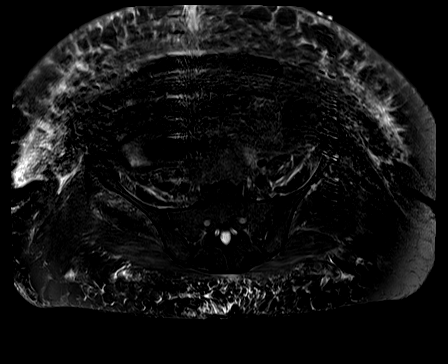
[im 39/39]
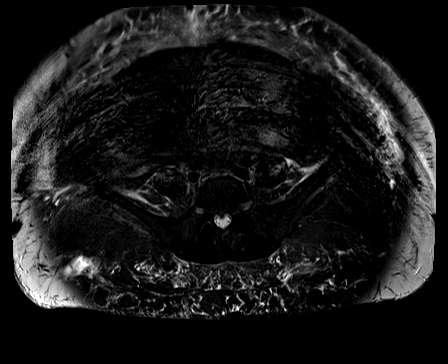

[Series 17: PD fat-sat · sagittal · right · 4.0mm · 0.69mm/px · 6 of 24 slices shown (1 of 2)]
[im 1/24]
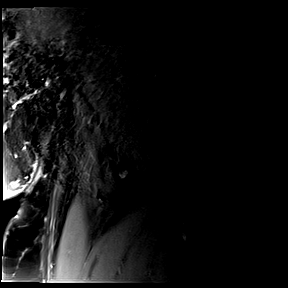
[im 5/24]
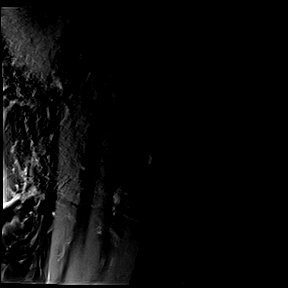
[im 10/24]
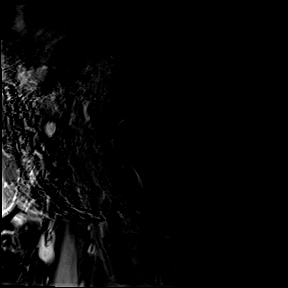
[im 14/24]
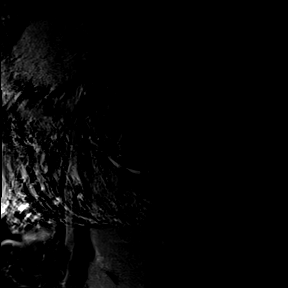
[im 19/24]
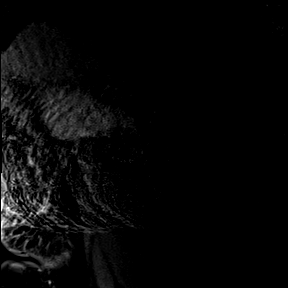
[im 24/24]
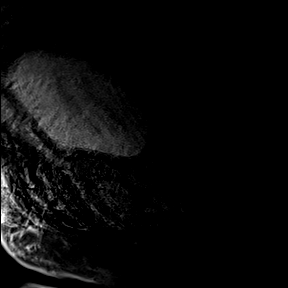

[Series 18: PD fat-sat · coronal · right · 3.0mm · 0.70mm/px · 6 of 23 slices shown (2 of 2)]
[im 1/23]
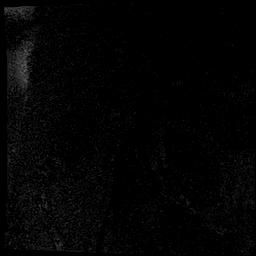
[im 5/23]
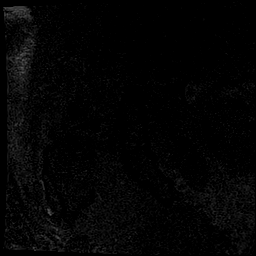
[im 9/23]
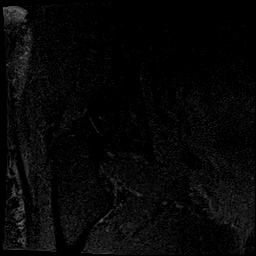
[im 14/23]
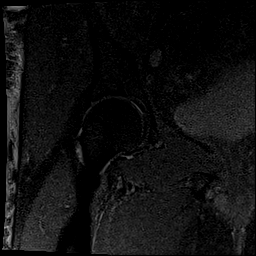
[im 18/23]
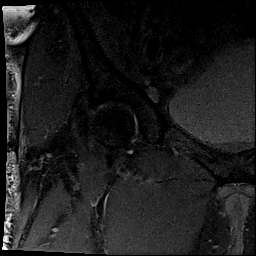
[im 23/23]
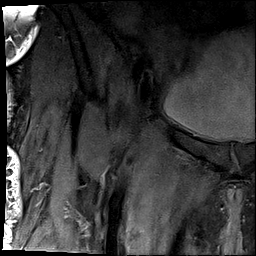

[39 of 40 positions shown; findings below may reference images not displayed]

FINDINGS: Both hips are normally located. No stress fracture or AVN. No hip
joint effusion or bony changes to suggest septic arthritis or
osteomyelitis. No periarticular fluid collections to suggest a
paralabral cyst. There are mild age advanced degenerative changes.
The pubic symphysis and SI joints are intact. No pelvic fractures or
bone lesions. No findings for septic arthritis.

Diffuse subcutaneous soft tissue swelling/edema and patchy fluid but
no discrete subcutaneous abscess. There is also mild diffuse
myositis without findings for pyomyositis.

No significant intrapelvic abnormalities. Small scattered borderline
pelvic and inguinal lymph nodes likely inflammatory/reactive.
IMPRESSION: 1. Diffuse subcutaneous soft tissue swelling/edema and patchy fluid
but no discrete subcutaneous abscess.
2. Mild diffuse myositis without findings for pyomyositis.
3. No findings for septic arthritis or osteomyelitis.
4. No stress fracture or bone lesions.

## 2023-12-24 ENCOUNTER — Other Ambulatory Visit: Payer: Self-pay

## 2023-12-24 ENCOUNTER — Emergency Department (HOSPITAL_COMMUNITY)
Admission: EM | Admit: 2023-12-24 | Discharge: 2023-12-25 | Disposition: A | Discharge status: Home / Self Care | Attending: Emergency Medicine | Admitting: Emergency Medicine

## 2023-12-24 ENCOUNTER — Encounter (HOSPITAL_COMMUNITY): Payer: Self-pay | Admitting: Radiology

## 2023-12-24 DIAGNOSIS — I132 Hypertensive heart and chronic kidney disease with heart failure and with stage 5 chronic kidney disease, or end stage renal disease: Secondary | ICD-10-CM | POA: Insufficient documentation

## 2023-12-24 DIAGNOSIS — I6522 Occlusion and stenosis of left carotid artery: Secondary | ICD-10-CM | POA: Insufficient documentation

## 2023-12-24 DIAGNOSIS — I509 Heart failure, unspecified: Secondary | ICD-10-CM | POA: Diagnosis not present

## 2023-12-24 DIAGNOSIS — N186 End stage renal disease: Secondary | ICD-10-CM | POA: Diagnosis not present

## 2023-12-24 DIAGNOSIS — R9431 Abnormal electrocardiogram [ECG] [EKG]: Secondary | ICD-10-CM | POA: Insufficient documentation

## 2023-12-24 DIAGNOSIS — Z992 Dependence on renal dialysis: Secondary | ICD-10-CM | POA: Insufficient documentation

## 2023-12-24 DIAGNOSIS — Z8673 Personal history of transient ischemic attack (TIA), and cerebral infarction without residual deficits: Secondary | ICD-10-CM | POA: Diagnosis not present

## 2023-12-24 DIAGNOSIS — E1122 Type 2 diabetes mellitus with diabetic chronic kidney disease: Secondary | ICD-10-CM | POA: Diagnosis not present

## 2023-12-24 DIAGNOSIS — H4922 Sixth [abducent] nerve palsy, left eye: Secondary | ICD-10-CM | POA: Insufficient documentation

## 2023-12-24 DIAGNOSIS — Z87891 Personal history of nicotine dependence: Secondary | ICD-10-CM | POA: Insufficient documentation

## 2023-12-24 DIAGNOSIS — I2510 Atherosclerotic heart disease of native coronary artery without angina pectoris: Secondary | ICD-10-CM | POA: Diagnosis not present

## 2023-12-24 DIAGNOSIS — R519 Headache, unspecified: Secondary | ICD-10-CM | POA: Diagnosis present

## 2023-12-24 MED ORDER — PROCHLORPERAZINE EDISYLATE 10 MG/2ML IJ SOLN
10.0000 mg | Freq: Once | INTRAMUSCULAR | Status: AC
  Administered 2023-12-25: 10 mg via INTRAVENOUS
  Filled 2023-12-24: qty 2

## 2023-12-24 MED ORDER — DIPHENHYDRAMINE HCL 50 MG/ML IJ SOLN
25.0000 mg | Freq: Once | INTRAMUSCULAR | Status: AC
  Administered 2023-12-25: 25 mg via INTRAVENOUS
  Filled 2023-12-24: qty 1

## 2023-12-24 NOTE — ED Triage Notes (Addendum)
 Pt in ambulatory with severe L sided HA that is sharp and behind L eye. States the onset began during an episode of vomiting from her gastroparesis 4 days ago. She states pain has remained 10/10 and she noticed that night that her L eye is crossed, along with blurred vision that continues. Takes eliquis , is a dialysis pt

## 2023-12-24 NOTE — ED Triage Notes (Signed)
 Pt left eye appear a bit swollen and she is unable to fully open it. Pt states that this is not her norm.

## 2023-12-24 NOTE — ED Provider Notes (Signed)
 [facility] DEPT [facility] Emergency Department Provider Note MRN:  [id number]  Arrival date & time: 12/25/23     Chief Complaint   Headache and Blurred Vision   History of Present Illness   Shirley [name] is a 48 y.o. year-old female with a history of CAD, stroke presenting to the ED with chief complaint of headache and blurred vision.  Patient explains that she has gastroparesis and was having a flare at home with persistent vomiting and dry heaving.  During one of the dry heaving episodes she felt sudden left-sided headache and left eye pain and ever since then she has had inability to move her left eye laterally.  Continued headache since then, not going away.  Double vision in the left eye as well.  Review of Systems  A thorough review of systems was obtained and all systems are negative except as noted in the HPI and PMH.   Patient's Health History    Past Medical History:  Diagnosis Date   Abscess of tunica vaginalis    10/09- Abundant S. aureus- sensitive to all abx   Anxiety    Blood dyscrasia    CAD (coronary artery disease) 06/15/2006   s/p Subendocardial MI with PDA angioplasty(no stent) on 06/15/06 and relook  cath 06/19/06 showed patency of site. Cath 12/10- no restenosis or significant CAD progression   CVA (cerebral vascular accident) (HCC) ~ 02/2014   denies residual on 04/22/2014   CVA (cerebral vascular accident) [facility])    history of remote right cerebellar infarct noted on head CT at least since 10/2011   Depression    Diabetes mellitus type 2, uncontrolled, with complications    Fibromyalgia    Gastritis    Gastroparesis    secondary to poorly controlled DM, last emptying study performed 01/2010  was normal but may be falsely positive as pt was on reglan    GERD (gastroesophageal reflux disease)    Hepatitis B, chronic (HCC)    Hep BeAb+,Hep B cAb+ & Hep BsAg+ (9/06)   History of pyelonephritis    H/o GrpB Pyelonephritis (9/06) and UTI- 07/11-  E.Coli, 12/10- GBS   Hyperlipidemia    Hypertension    Iron  deficiency anemia    Irregular menses    Small ovarian follicles seen on CT(9/06)   MI (myocardial infarction) (HCC) 05/2006   PDA percutaneous transluminal coronary angioplasty   Migraine    weekly (04/22/2014)   N&V (nausea and vomiting)    Chronic. Unclear etiology with multiple admission and ED visits. CT abdomen with and without contrast (02/2011)  showed no acute process. Gastic Emptying scan (01/2010) was normal. Ultrasound of the abdomen was within normal limits. Hepatitis B viral load was undectable. HIV NR. EGD - gastritis, Hpylori + s/p Rx   New onset of congestive heart failure (HCC) 05/16/2022   Obesity    OSA (obstructive sleep apnea)    suppose to wear mask but I don't (04/22/2014)   Peripheral neuropathy    Pneumonia    this is probably the 2nd or 3rd time I've had pneumonia (04/22/2014)   Recurrent boils    Seasonal asthma    Substance abuse (HCC)    Thrombocytosis    Hem/Onc suggested 2/2 chronic hepatits and/or iron  deficiency anemia    Past Surgical History:  Procedure Laterality Date   CESAREAN SECTION  1997   CORONARY ANGIOPLASTY WITH STENT PLACEMENT  2008   2 stents   ESOPHAGOGASTRODUODENOSCOPY N/A 04/23/2014   Procedure: ESOPHAGOGASTRODUODENOSCOPY (EGD);  Surgeon: Lynwood  LITTIE Celestia Raddle., MD;  Location: [facility] ENDOSCOPY;  Service: Endoscopy;  Laterality: N/A;   INCISION AND DRAINAGE ABSCESS Right 04/17/2021   Procedure: INCISION AND DRAINAGE CHEST WALL ABSCESS;  Surgeon: Paola Dreama SAILOR, MD;  Location: [facility] OR;  Service: General;  Laterality: Right;   INSERTION OF ARTERIOVENOUS (AV) ARTEGRAFT ARM Left 08/01/2023   Procedure: LEFT ARTERIOVENOUS GRAFT;  Surgeon: Gretta Lonni PARAS, MD;  Location: [facility] OR;  Service: Vascular;  Laterality: Left;   IR ANGIOGRAM PELVIS SELECTIVE OR SUPRASELECTIVE  12/07/2016   IR ANGIOGRAM PELVIS SELECTIVE OR SUPRASELECTIVE  12/07/2016   IR ANGIOGRAM SELECTIVE EACH  ADDITIONAL VESSEL  12/07/2016   IR ANGIOGRAM SELECTIVE EACH ADDITIONAL VESSEL  12/07/2016   IR EMBO ARTERIAL NOT HEMORR HEMANG INC GUIDE ROADMAPPING  12/07/2016   IR FLUORO GUIDE CV LINE RIGHT  04/19/2023   IR RADIOLOGIST EVAL & MGMT  01/09/2017   IR REMOVAL TUN CV CATH W/O FL  11/13/2023   IR US  GUIDE VASC ACCESS RIGHT  12/07/2016   IR US  GUIDE VASC ACCESS RIGHT  04/19/2023   IRRIGATION AND DEBRIDEMENT FOOT Right 04/07/2020   Procedure: Incision and Drainage Right Foot;  Surgeon: Gretel Ozell PARAS, DPM;  Location: [facility] ORS;  Service: Podiatry;  Laterality: Right;   RIGHT HEART CATH N/A 08/14/2022   Procedure: RIGHT HEART CATH;  Surgeon: Court Dorn PARAS, MD;  Location: [facility] INVASIVE CV LAB;  Service: Cardiovascular;  Laterality: N/A;    Family History  Problem Relation Age of Onset   Diabetes Father    Healthy Mother     Social History   Socioeconomic History   Marital status: Single    Spouse name: Not on file   Number of children: 2   Years of education: 11   Highest education level: Not on file  Occupational History   Occupation: other    Comment: unemployed    Comment: disability  Tobacco Use   Smoking status: Former    Current packs/day: 0.00    Types: Cigarettes    Quit date: 04/24/1996    Years since quitting: 27.6   Smokeless tobacco: Never   Tobacco comments:    quit smoking cigarettes age 4  Vaping Use   Vaping status: Never Used  Substance and Sexual Activity   Alcohol use: No    Alcohol/week: 0.0 standard drinks of alcohol    Comment: 04/22/2014 might have a few drinks a month   Drug use: Not Currently    Types: Marijuana, Cocaine    Comment: 04/22/2104 quit drugs ~ 1-2 yr ago   Sexual activity: Yes    Birth control/protection: None  Other Topics Concern   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          Social Drivers of Corporate investment banker Strain: Low Risk  (08/09/2022)   Overall Financial Resource Strain (CARDIA)    Difficulty of Paying Living Expenses: Not hard at all  Food Insecurity: No Food Insecurity (04/16/2023)   Hunger Vital Sign    Worried About Running Out of Food in the Last Year: Never true    Ran Out of Food in the Last Year: Never true  Transportation Needs: Patient Unable To Answer (04/16/2023)   PRAPARE - Transportation    Lack of Transportation (Medical): Patient unable to answer  Lack of Transportation (Non-Medical): Patient unable to answer  Physical Activity: Not on file  Stress: Not on file  Social Connections: Not on file  Intimate Partner Violence: Patient Unable To Answer (04/16/2023)   Humiliation, Afraid, Rape, and Kick questionnaire    Fear of Current or Ex-Partner: Patient unable to answer    Emotionally Abused: Patient unable to answer    Physically Abused: Patient unable to answer    Sexually Abused: Patient unable to answer     Physical Exam   Vitals:   12/25/23 0200 12/25/23 0300  BP: (!) 159/84 138/76  Pulse: (!) 101 100  Resp: 14 16  Temp:    SpO2: 100% 100%    CONSTITUTIONAL: Well-appearing, NAD NEURO/PSYCH:  Alert and oriented x 3, no focal deficits EYES:  eyes equal and reactive, inability to move left eye laterally ENT/NECK:  no LAD, no JVD CARDIO: Regular rate, well-perfused, normal S1 and S2 PULM:  CTAB no wheezing or rhonchi GI/GU:  non-distended, non-tender MSK/SPINE:  No gross deformities, no edema SKIN:  no rash, atraumatic   *Additional and/or pertinent findings included in MDM below  Diagnostic and Interventional Summary    EKG Interpretation Date/Time:  Tuesday December 25 2023 00:05:35 EDT Ventricular Rate:  97 PR Interval:  125 QRS Duration:  91 QT Interval:  387 QTC Calculation: 492 R Axis:   85  Text Interpretation: Sinus rhythm Borderline prolonged QT interval Confirmed by Theadore Sharper  [phone]) on 12/25/2023 1:40:22 AM       Labs Reviewed  PROTIME-INR - Abnormal; Notable for the following components:      Result Value   Prothrombin Time 15.4 (*)    All other components within normal limits  CBC - Abnormal; Notable for the following components:   WBC 11.5 (*)    RBC 3.52 (*)    Hemoglobin 9.7 (*)    HCT 27.9 (*)    MCV 79.3 (*)    RDW 17.8 (*)    All other components within normal limits  DIFFERENTIAL - Abnormal; Notable for the following components:   Neutro Abs 8.1 (*)    All other components within normal limits  COMPREHENSIVE METABOLIC PANEL WITH GFR - Abnormal; Notable for the following components:   Glucose, Bld 153 (*)    BUN 35 (*)    Creatinine, Ser 5.55 (*)    Calcium  7.9 (*)    Albumin  2.9 (*)    GFR, Estimated 9 (*)    All other components within normal limits  I-STAT CHEM 8, ED - Abnormal; Notable for the following components:   BUN 40 (*)    Creatinine, Ser 5.60 (*)    Glucose, Bld 152 (*)    Calcium , Ion 1.01 (*)    Hemoglobin 9.9 (*)    HCT 29.0 (*)    All other components within normal limits  ETHANOL  APTT  RAPID URINE DRUG SCREEN, HOSP PERFORMED    MR BRAIN WO CONTRAST  Final Result    CT ANGIO HEAD NECK W WO CM  Final Result      Medications  diphenhydrAMINE  (BENADRYL ) injection 25 mg (25 mg Intravenous Given 12/25/23 0009)  prochlorperazine  (COMPAZINE ) injection 10 mg (10 mg Intravenous Given 12/25/23 0009)  iohexol  (OMNIPAQUE ) 350 MG/ML injection 60 mL (60 mLs Intravenous Contrast Given 12/25/23 0051)     Procedures  /  Critical Care Procedures  ED Course and Medical Decision Making  Initial Impression and Ddx Concerning history of acute headache and loss of extraocular  movement to the left eye laterally 4 days ago after forceful vomiting.  Question hemorrhagic versus ischemic stroke versus lateral rectus palsy.  Obtaining CTA head and neck, will likely touch base with neurology, suspect need for MRI.  Past medical/surgical  history that increases complexity of ED encounter: ESRD  Interpretation of Diagnostics I personally reviewed the Laboratory Testing and my interpretation is as follows: No significant blood count or electrolyte disturbance.  CT and MRI are without acute process.  Patient Reassessment and Ultimate Disposition/Management     Patient well-appearing, symptoms are resolved after medications above.  Case discussed with neurology, 6th nerve palsy likely related to patient's diabetes.  Appropriate for discharge.  Patient management required discussion with the following services or consulting groups:  None  Complexity of Problems Addressed Acute illness or injury that poses threat of life of bodily function  Additional Data Reviewed and Analyzed Further history obtained from: None  Additional Factors Impacting ED Encounter Risk Consideration of hospitalization  Ozell HERO. 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  Your exam/testing today is overall reassuring.  Symptoms likely due to a nerve palsy.  Recommend follow-up with ophthalmology and your primary care doctor to discuss management.  Please return to the Emergency Department if you experience any worsening of your condition.   Thank you for allowing us  to be a part of your care.       Theadore Ozell HERO, MD 12/25/23 [phone]

## 2023-12-24 NOTE — ED Triage Notes (Signed)
 Also endorses her balance has been off for a month and she has had multiple falls.

## 2023-12-25 ENCOUNTER — Encounter (HOSPITAL_COMMUNITY): Payer: Self-pay | Admitting: Radiology

## 2023-12-25 ENCOUNTER — Emergency Department (HOSPITAL_COMMUNITY)

## 2023-12-25 DIAGNOSIS — H4922 Sixth [abducent] nerve palsy, left eye: Secondary | ICD-10-CM | POA: Diagnosis not present

## 2023-12-25 LAB — DIFFERENTIAL
Abs Immature Granulocytes: 0.05 K/uL (ref 0.00–0.07)
Basophils Absolute: 0 K/uL (ref 0.0–0.1)
Basophils Relative: 0 %
Eosinophils Absolute: 0.3 K/uL (ref 0.0–0.5)
Eosinophils Relative: 3 %
Immature Granulocytes: 0 %
Lymphocytes Relative: 18 %
Lymphs Abs: 2 K/uL (ref 0.7–4.0)
Monocytes Absolute: 0.9 K/uL (ref 0.1–1.0)
Monocytes Relative: 8 %
Neutro Abs: 8.1 K/uL — ABNORMAL HIGH (ref 1.7–7.7)
Neutrophils Relative %: 71 %

## 2023-12-25 LAB — COMPREHENSIVE METABOLIC PANEL WITH GFR
ALT: 9 U/L (ref 0–44)
AST: 18 U/L (ref 15–41)
Albumin: 2.9 g/dL — ABNORMAL LOW (ref 3.5–5.0)
Alkaline Phosphatase: 71 U/L (ref 38–126)
Anion gap: 11 (ref 5–15)
BUN: 35 mg/dL — ABNORMAL HIGH (ref 6–20)
CO2: 27 mmol/L (ref 22–32)
Calcium: 7.9 mg/dL — ABNORMAL LOW (ref 8.9–10.3)
Chloride: 99 mmol/L (ref 98–111)
Creatinine, Ser: 5.55 mg/dL — ABNORMAL HIGH (ref 0.44–1.00)
GFR, Estimated: 9 mL/min — ABNORMAL LOW (ref >60)
Glucose, Bld: 153 mg/dL — ABNORMAL HIGH (ref 70–99)
Potassium: 4.1 mmol/L (ref 3.5–5.1)
Sodium: 137 mmol/L (ref 135–145)
Total Bilirubin: 0.6 mg/dL (ref 0.0–1.2)
Total Protein: 7.2 g/dL (ref 6.5–8.1)

## 2023-12-25 LAB — CBC
HCT: 27.9 % — ABNORMAL LOW (ref 36.0–46.0)
Hemoglobin: 9.7 g/dL — ABNORMAL LOW (ref 12.0–15.0)
MCH: 27.6 pg (ref 26.0–34.0)
MCHC: 34.8 g/dL (ref 30.0–36.0)
MCV: 79.3 fL — ABNORMAL LOW (ref 80.0–100.0)
Platelets: 307 K/uL (ref 150–400)
RBC: 3.52 MIL/uL — ABNORMAL LOW (ref 3.87–5.11)
RDW: 17.8 % — ABNORMAL HIGH (ref 11.5–15.5)
WBC: 11.5 K/uL — ABNORMAL HIGH (ref 4.0–10.5)
nRBC: 0 % (ref 0.0–0.2)

## 2023-12-25 LAB — I-STAT CHEM 8, ED
BUN: 40 mg/dL — ABNORMAL HIGH (ref 6–20)
Calcium, Ion: 1.01 mmol/L — ABNORMAL LOW (ref 1.15–1.40)
Chloride: 99 mmol/L (ref 98–111)
Creatinine, Ser: 5.6 mg/dL — ABNORMAL HIGH (ref 0.44–1.00)
Glucose, Bld: 152 mg/dL — ABNORMAL HIGH (ref 70–99)
HCT: 29 % — ABNORMAL LOW (ref 36.0–46.0)
Hemoglobin: 9.9 g/dL — ABNORMAL LOW (ref 12.0–15.0)
Potassium: 4.3 mmol/L (ref 3.5–5.1)
Sodium: 139 mmol/L (ref 135–145)
TCO2: 29 mmol/L (ref 22–32)

## 2023-12-25 LAB — PROTIME-INR
INR: 1.2 (ref 0.8–1.2)
Prothrombin Time: 15.4 s — ABNORMAL HIGH (ref 11.4–15.2)

## 2023-12-25 LAB — APTT: aPTT: 33 s (ref 24–36)

## 2023-12-25 LAB — ETHANOL: Alcohol, Ethyl (B): <15 mg/dL (ref <15)

## 2023-12-25 MED ORDER — IOHEXOL 350 MG/ML SOLN
60.0000 mL | Freq: Once | INTRAVENOUS | Status: AC | PRN
  Administered 2023-12-25: 60 mL via INTRAVENOUS

## 2023-12-25 NOTE — ED Notes (Signed)
 Patient is resting in bed.

## 2023-12-25 NOTE — Discharge Instructions (Signed)
 You were evaluated in the Emergency Department and after careful evaluation, we did not find any emergent condition requiring admission or further testing in the hospital.  Your exam/testing today is overall reassuring.  Symptoms likely due to a nerve palsy.  Recommend follow-up with ophthalmology and your primary care doctor to discuss management.  Please return to the Emergency Department if you experience any worsening of your condition.   Thank you for allowing us  to be a part of your care.

## 2023-12-25 NOTE — ED Notes (Signed)
 To CT

## 2023-12-25 NOTE — ED Notes (Signed)
 Back from MRI.

## 2023-12-25 NOTE — ED Notes (Signed)
With MRI

## 2023-12-28 IMAGING — US US PELVIS COMPLETE WITH TRANSVAGINAL
1 series · 13 of 25 positions shown · non-contrast
Comparison: 04/16/2021

CLINICAL DATA: Pelvic pain, history of fibroids



[Series 1: us pelvis complete with transvaginal · 0.25mm/px · 57 acquisitions, 13 frames shown]
[im 1/57]
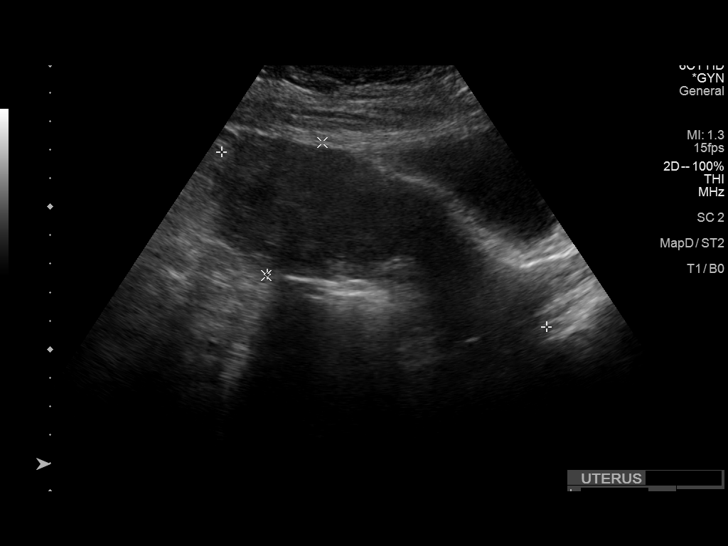
[im 5/57]
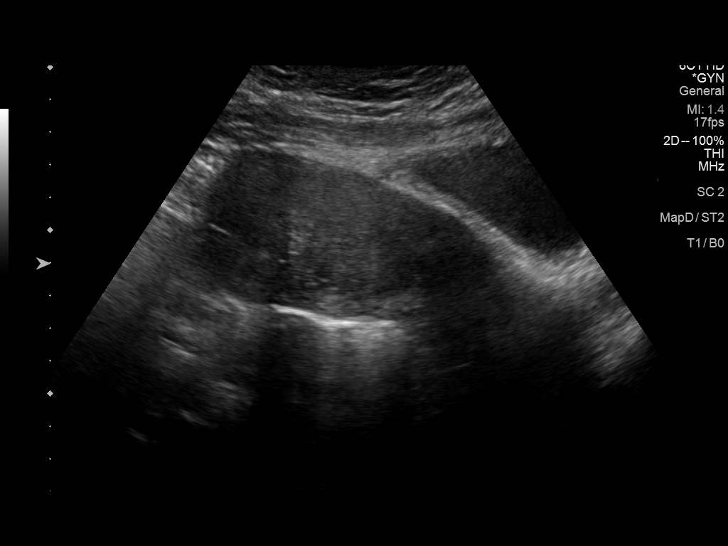
[im 10/57]
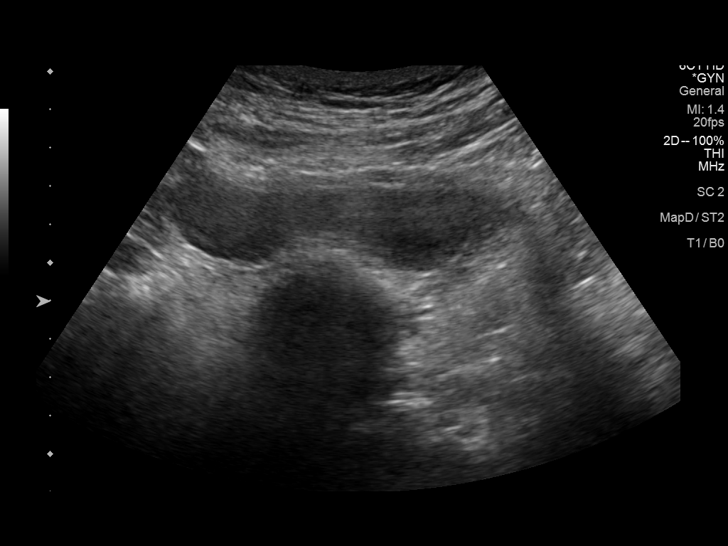
[im 15/57]
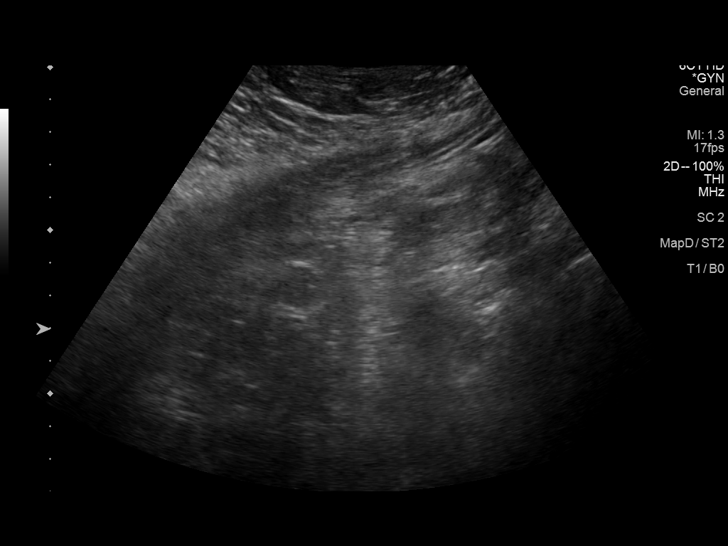
[im 19/57]
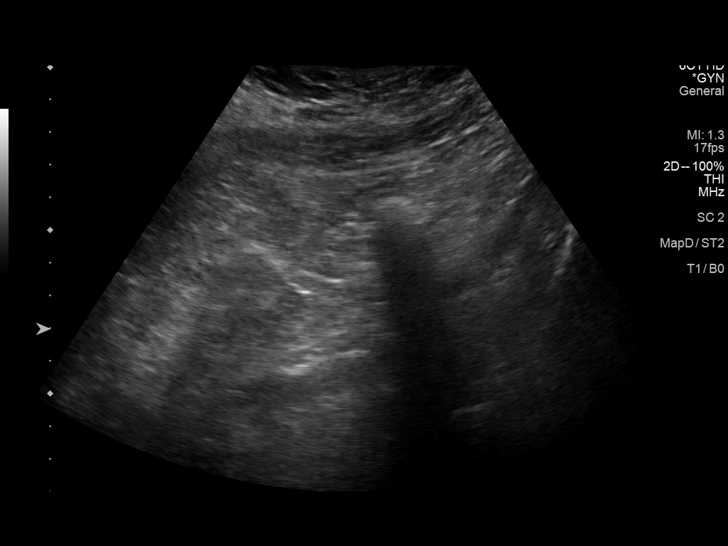
[im 24/57]
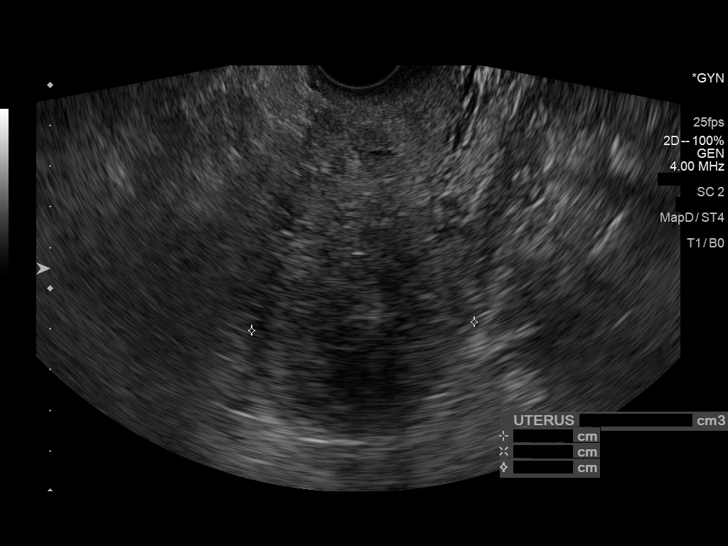
[im 29/57]
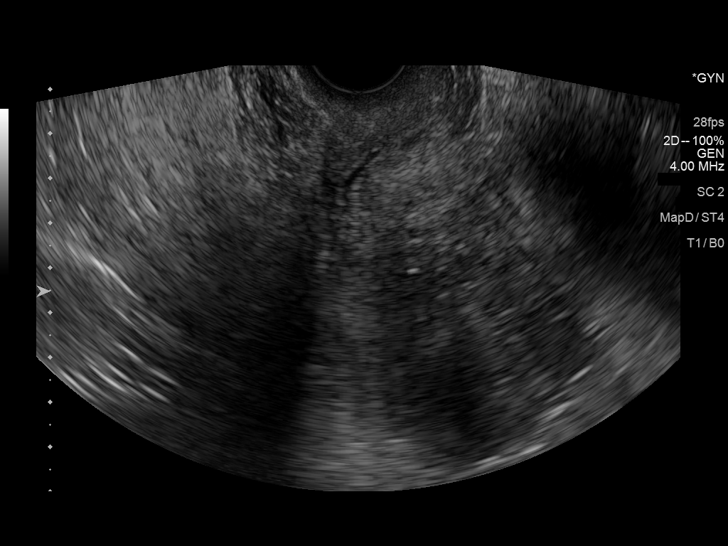
[im 33/57]
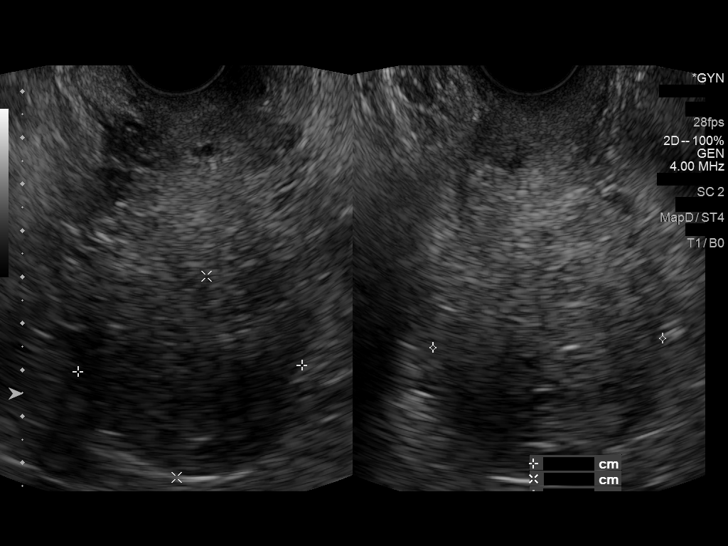
[im 38/57]
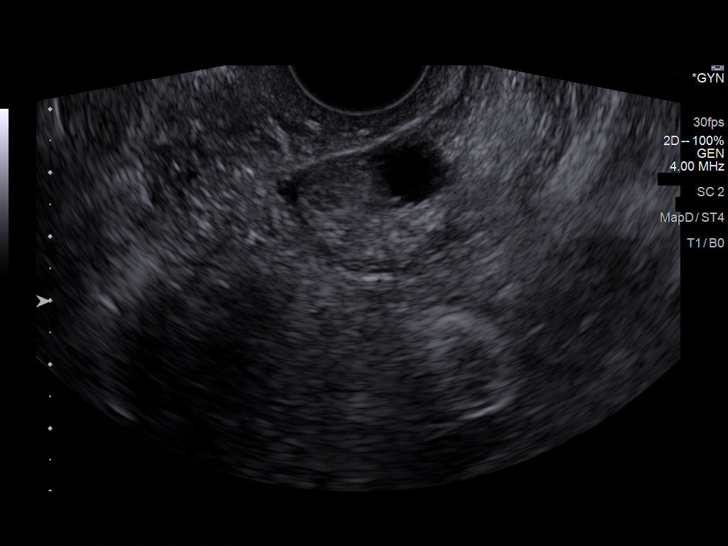
[im 43/57]
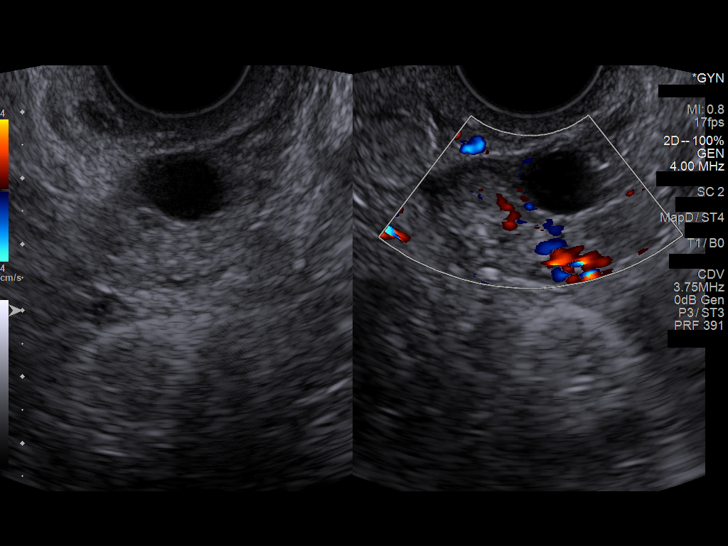
[im 47/57]
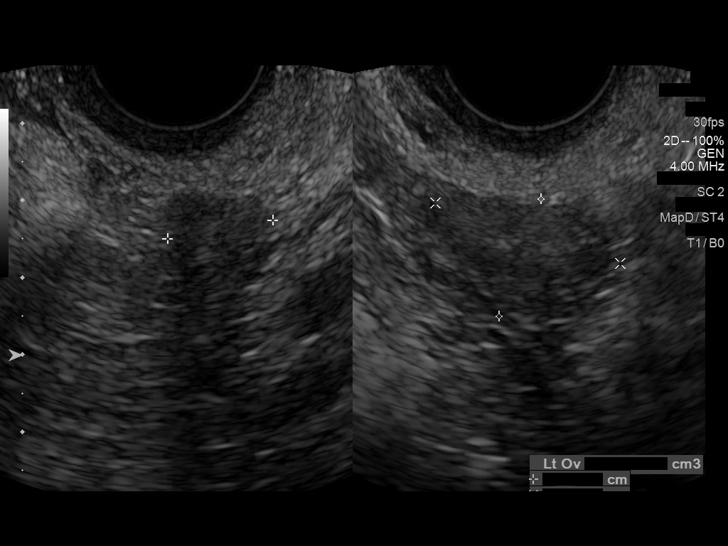
[im 52/57]
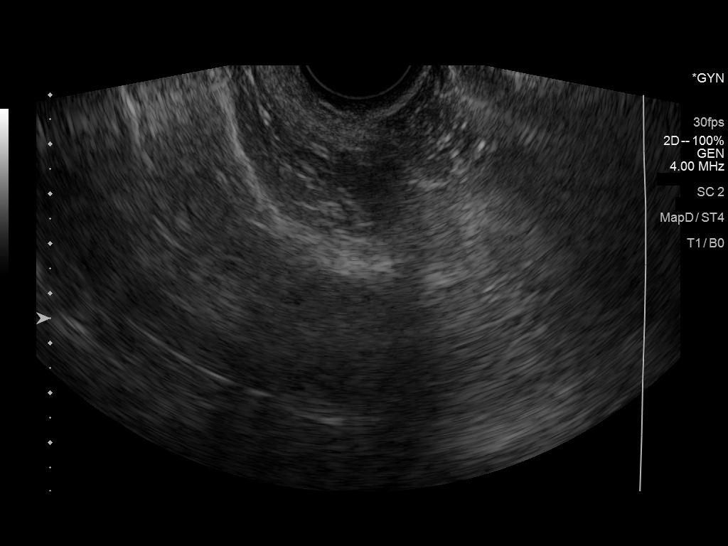
[im 57/57]
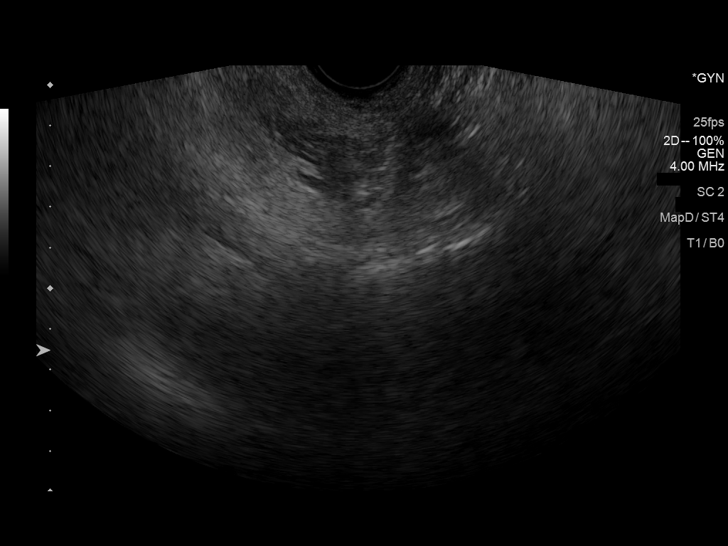

[13 of 25 positions shown; findings below may reference images not displayed]

FINDINGS: Uterus

Measurements: 8.7 x 6.3 x 5.5 = volume: 156 mL. Large fibroid within
the right aspect of the uterine fundus measures 4.8 x 4.4 x 5.0 cm,
corresponding to the abnormality seen on previous CT.

Endometrium

Thickness: 11 mm.  No focal abnormality visualized.

Right ovary

Measurements: 3.2 x 2.1 x 3.0 cm = volume: 10.3 mL. Normal
appearance/no adnexal mass.

Left ovary

Measurements: 1.4 x 2.5 x 1.6 cm = volume: 2.9 mL. Normal
appearance/no adnexal mass. Specifically, no abnormality to
correspond to the finding on CT previously.

Other findings

No abnormal free fluid.
IMPRESSION: 1. Intramural uterine fibroid measuring 5 cm within the uterine
fundus, corresponding to the CT finding.
2. Otherwise unremarkable pelvic ultrasound.

## 2024-01-17 NOTE — Progress Notes (Signed)
 Shirley [name]. Pilar Plate, MD [facility] [email or web address]

## 2024-03-04 ENCOUNTER — Emergency Department (HOSPITAL_COMMUNITY)

## 2024-03-04 ENCOUNTER — Emergency Department (HOSPITAL_COMMUNITY)
Admission: EM | Admit: 2024-03-04 | Discharge: 2024-03-04 | Disposition: A | Discharge status: Home / Self Care | Attending: Emergency Medicine | Admitting: Emergency Medicine

## 2024-03-04 ENCOUNTER — Other Ambulatory Visit: Payer: Self-pay

## 2024-03-04 DIAGNOSIS — E1143 Type 2 diabetes mellitus with diabetic autonomic (poly)neuropathy: Secondary | ICD-10-CM | POA: Insufficient documentation

## 2024-03-04 DIAGNOSIS — Z794 Long term (current) use of insulin: Secondary | ICD-10-CM | POA: Diagnosis not present

## 2024-03-04 DIAGNOSIS — Z86711 Personal history of pulmonary embolism: Secondary | ICD-10-CM | POA: Insufficient documentation

## 2024-03-04 DIAGNOSIS — N186 End stage renal disease: Secondary | ICD-10-CM | POA: Diagnosis not present

## 2024-03-04 DIAGNOSIS — I700 Atherosclerosis of aorta: Secondary | ICD-10-CM | POA: Insufficient documentation

## 2024-03-04 DIAGNOSIS — E1122 Type 2 diabetes mellitus with diabetic chronic kidney disease: Secondary | ICD-10-CM | POA: Insufficient documentation

## 2024-03-04 DIAGNOSIS — Z7901 Long term (current) use of anticoagulants: Secondary | ICD-10-CM | POA: Insufficient documentation

## 2024-03-04 DIAGNOSIS — Z992 Dependence on renal dialysis: Secondary | ICD-10-CM | POA: Diagnosis not present

## 2024-03-04 DIAGNOSIS — Z955 Presence of coronary angioplasty implant and graft: Secondary | ICD-10-CM | POA: Insufficient documentation

## 2024-03-04 DIAGNOSIS — R1032 Left lower quadrant pain: Secondary | ICD-10-CM | POA: Insufficient documentation

## 2024-03-04 DIAGNOSIS — R112 Nausea with vomiting, unspecified: Secondary | ICD-10-CM | POA: Diagnosis present

## 2024-03-04 DIAGNOSIS — K3184 Gastroparesis: Secondary | ICD-10-CM | POA: Diagnosis not present

## 2024-03-04 DIAGNOSIS — Z79899 Other long term (current) drug therapy: Secondary | ICD-10-CM | POA: Insufficient documentation

## 2024-03-04 DIAGNOSIS — Z86718 Personal history of other venous thrombosis and embolism: Secondary | ICD-10-CM | POA: Diagnosis not present

## 2024-03-04 DIAGNOSIS — I2510 Atherosclerotic heart disease of native coronary artery without angina pectoris: Secondary | ICD-10-CM | POA: Diagnosis not present

## 2024-03-04 DIAGNOSIS — I509 Heart failure, unspecified: Secondary | ICD-10-CM | POA: Insufficient documentation

## 2024-03-04 DIAGNOSIS — Z91158 Patient's noncompliance with renal dialysis for other reason: Secondary | ICD-10-CM | POA: Diagnosis present

## 2024-03-04 DIAGNOSIS — R110 Nausea: Secondary | ICD-10-CM

## 2024-03-04 LAB — URINALYSIS, ROUTINE W REFLEX MICROSCOPIC
Bilirubin Urine: NEGATIVE
Glucose, UA: 50 mg/dL — AB
Ketones, ur: 5 mg/dL — AB
Leukocytes,Ua: NEGATIVE
Nitrite: NEGATIVE
Protein, ur: >=300 mg/dL — AB
Specific Gravity, Urine: 1.014 (ref 1.005–1.030)
pH: 8 (ref 5.0–8.0)

## 2024-03-04 LAB — COMPREHENSIVE METABOLIC PANEL WITH GFR
ALT: 11 U/L (ref 0–44)
AST: 18 U/L (ref 15–41)
Albumin: 3.5 g/dL (ref 3.5–5.0)
Alkaline Phosphatase: 70 U/L (ref 38–126)
Anion gap: 16 — ABNORMAL HIGH (ref 5–15)
BUN: 46 mg/dL — ABNORMAL HIGH (ref 6–20)
CO2: 19 mmol/L — ABNORMAL LOW (ref 22–32)
Calcium: 9.5 mg/dL (ref 8.9–10.3)
Chloride: 105 mmol/L (ref 98–111)
Creatinine, Ser: 8.96 mg/dL — ABNORMAL HIGH (ref 0.44–1.00)
GFR, Estimated: 5 mL/min — ABNORMAL LOW (ref >60)
Glucose, Bld: 88 mg/dL (ref 70–99)
Potassium: 5.4 mmol/L — ABNORMAL HIGH (ref 3.5–5.1)
Sodium: 140 mmol/L (ref 135–145)
Total Bilirubin: 0.7 mg/dL (ref 0.0–1.2)
Total Protein: 8.6 g/dL — ABNORMAL HIGH (ref 6.5–8.1)

## 2024-03-04 LAB — RAPID URINE DRUG SCREEN, HOSP PERFORMED
Amphetamines: NOT DETECTED
Barbiturates: NOT DETECTED
Benzodiazepines: NOT DETECTED
Cocaine: NOT DETECTED
Opiates: NOT DETECTED
Tetrahydrocannabinol: NOT DETECTED

## 2024-03-04 LAB — CBC WITH DIFFERENTIAL/PLATELET
Abs Immature Granulocytes: 0.07 K/uL (ref 0.00–0.07)
Basophils Absolute: 0.1 K/uL (ref 0.0–0.1)
Basophils Relative: 1 %
Eosinophils Absolute: 0.2 K/uL (ref 0.0–0.5)
Eosinophils Relative: 1 %
HCT: 32.4 % — ABNORMAL LOW (ref 36.0–46.0)
Hemoglobin: 11 g/dL — ABNORMAL LOW (ref 12.0–15.0)
Immature Granulocytes: 1 %
Lymphocytes Relative: 10 %
Lymphs Abs: 1.4 K/uL (ref 0.7–4.0)
MCH: 27.3 pg (ref 26.0–34.0)
MCHC: 34 g/dL (ref 30.0–36.0)
MCV: 80.4 fL (ref 80.0–100.0)
Monocytes Absolute: 1.1 K/uL — ABNORMAL HIGH (ref 0.1–1.0)
Monocytes Relative: 8 %
Neutro Abs: 11.6 K/uL — ABNORMAL HIGH (ref 1.7–7.7)
Neutrophils Relative %: 79 %
Platelets: 297 K/uL (ref 150–400)
RBC: 4.03 MIL/uL (ref 3.87–5.11)
RDW: 17.8 % — ABNORMAL HIGH (ref 11.5–15.5)
WBC: 14.4 K/uL — ABNORMAL HIGH (ref 4.0–10.5)
nRBC: 0 % (ref 0.0–0.2)

## 2024-03-04 LAB — I-STAT CHEM 8, ED
BUN: 45 mg/dL — ABNORMAL HIGH (ref 6–20)
Calcium, Ion: 1.17 mmol/L (ref 1.15–1.40)
Chloride: 109 mmol/L (ref 98–111)
Creatinine, Ser: 9.5 mg/dL — ABNORMAL HIGH (ref 0.44–1.00)
Glucose, Bld: 84 mg/dL (ref 70–99)
HCT: 32 % — ABNORMAL LOW (ref 36.0–46.0)
Hemoglobin: 10.9 g/dL — ABNORMAL LOW (ref 12.0–15.0)
Potassium: 5.4 mmol/L — ABNORMAL HIGH (ref 3.5–5.1)
Sodium: 141 mmol/L (ref 135–145)
TCO2: 22 mmol/L (ref 22–32)

## 2024-03-04 LAB — LIPASE, BLOOD: Lipase: 27 U/L (ref 11–51)

## 2024-03-04 LAB — HCG, SERUM, QUALITATIVE: Preg, Serum: NEGATIVE

## 2024-03-04 MED ORDER — REGLAN 5 MG PO TABS: 5.0000 mg | ORAL_TABLET | Freq: Two times a day (BID) | ORAL | 0 refills | Status: AC | PRN

## 2024-03-04 MED ORDER — FENTANYL CITRATE (PF) 50 MCG/ML IJ SOSY
100.0000 ug | PREFILLED_SYRINGE | Freq: Once | INTRAMUSCULAR | Status: AC
  Administered 2024-03-04: 100 ug via INTRAVENOUS
  Filled 2024-03-04: qty 2

## 2024-03-04 MED ORDER — ONDANSETRON HCL 4 MG/2ML IJ SOLN
4.0000 mg | Freq: Once | INTRAMUSCULAR | Status: AC
  Administered 2024-03-04: 4 mg via INTRAVENOUS
  Filled 2024-03-04: qty 2

## 2024-03-04 MED ORDER — HYDROMORPHONE HCL 1 MG/ML IJ SOLN
0.5000 mg | Freq: Once | INTRAMUSCULAR | Status: AC
  Administered 2024-03-04: 0.5 mg via INTRAVENOUS
  Filled 2024-03-04: qty 1

## 2024-03-04 MED ORDER — FENTANYL CITRATE (PF) 50 MCG/ML IJ SOSY
50.0000 ug | PREFILLED_SYRINGE | Freq: Once | INTRAMUSCULAR | Status: AC
  Administered 2024-03-04: 50 ug via INTRAVENOUS
  Filled 2024-03-04: qty 1

## 2024-03-04 MED ORDER — IOHEXOL 350 MG/ML SOLN
75.0000 mL | Freq: Once | INTRAVENOUS | Status: AC | PRN
  Administered 2024-03-04: 75 mL via INTRAVENOUS

## 2024-03-04 MED ORDER — METOCLOPRAMIDE HCL 5 MG/ML IJ SOLN
10.0000 mg | Freq: Once | INTRAMUSCULAR | Status: AC
  Administered 2024-03-04: 10 mg via INTRAVENOUS
  Filled 2024-03-04: qty 2

## 2024-03-04 MED ORDER — SODIUM ZIRCONIUM CYCLOSILICATE 10 G PO PACK
10.0000 g | PACK | ORAL | Status: AC
  Administered 2024-03-04: 10 g via ORAL
  Filled 2024-03-04: qty 1

## 2024-03-04 NOTE — ED Provider Notes (Addendum)
 Patient has been on dialysis since January.  We can waive for labs for her CT scan   Shirley Lamar BROCKS, MD 03/04/24 1541    Shirley Lamar BROCKS, MD 03/04/24 [phone]

## 2024-03-04 NOTE — ED Provider Notes (Signed)
  EMERGENCY DEPARTMENT AT [facility] Provider Note   CSN: [id number] Arrival date & time: 03/04/24  1510     Patient presents with: Abdominal Pain   Shirley [name] is a 48 y.o. female.   48 year old female history of gastroparesis, CAD status post PCI, DVT/PE on Eliquis , CHF, and ESRD on HD who presents with left lower quadrant abdominal pain and n/v.  Patient reports that for the past week she has been having left lower quadrant abdominal pain.  Says that she has started developing nausea and vomiting as well.  No bloody or bilious.  No diarrhea.  No vaginal discharge or bleeding.  No concern for STIs.  Denies abdominal surgeries.  Denies marijuana or other substance use.  Says that she has missed 2 sessions of dialysis due to the pain       Prior to Admission medications   Medication Sig Start Date End Date Taking? Authorizing Provider  albuterol  (PROVENTIL ) (2.5 MG/3ML) 0.083% nebulizer solution Take 2.5 mg by nebulization every 6 (six) hours as needed for wheezing or shortness of breath. 05/02/23   [provider]  albuterol  (VENTOLIN  HFA) 108 (90 Base) MCG/ACT inhaler Inhale 2 puffs into the lungs every 4 (four) hours as needed for wheezing or shortness of breath. 11/30/19   Fairy Frames, MD  amLODipine  (NORVASC ) 10 MG tablet Take 10 mg by mouth daily. 07/13/23   [provider]  amLODipine  (NORVASC ) 5 MG tablet Take 1 tablet (5 mg total) by mouth daily. Patient not taking: Reported on 08/01/2023 04/27/23   Patsy Lenis, MD  apixaban  (ELIQUIS ) 5 MG TABS tablet Take 1 tablet (5 mg total) by mouth 2 (two) times daily. 04/26/23   Patsy Lenis, MD  aspirin  81 MG chewable tablet Chew 81 mg by mouth daily. Patient not taking: Reported on 08/01/2023    [provider]  atorvastatin  (LIPITOR ) 40 MG tablet Take 40 mg by mouth daily. Patient not taking: Reported on 08/01/2023 05/09/23   [provider]  Blood Glucose Monitoring Suppl  (ONETOUCH VERIO FLEX SYSTEM) w/Device KIT Use in the morning, at noon, and at bedtime. 05/23/22   Arrien, Mauricio Daniel, MD  calcium  acetate (PHOSLO ) 667 MG capsule Take 1 capsule (667 mg total) by mouth 3 (three) times daily with meals. Patient not taking: Reported on 08/01/2023 04/26/23   Patsy Lenis, MD  carvedilol  (COREG ) 25 MG tablet Take 1 tablet (25 mg total) by mouth 2 (two) times daily with a meal. Patient not taking: Reported on 04/20/2023 08/28/22 09/27/22  Marcine Catalan M, PA-C  cyclobenzaprine  (FLEXERIL ) 10 MG tablet Take 10 mg by mouth 3 (three) times daily as needed for muscle spasms. Patient not taking: Reported on 08/01/2023    [provider]  diphenhydrAMINE  (BENADRYL ) 25 MG tablet Take 25 mg by mouth every 6 (six) hours as needed for allergies.    [provider]  docusate sodium  (COLACE) 100 MG capsule Take 100 mg by mouth 2 (two) times daily as needed for mild constipation or moderate constipation. Patient not taking: Reported on 08/01/2023 02/12/23   [provider]  famotidine  (PEPCID ) 20 MG tablet Take 20 mg by mouth daily. Patient not taking: Reported on 08/01/2023 07/24/23   [provider]  ferrous sulfate  325 (65 FE) MG tablet Take 325 mg by mouth daily with breakfast. Patient not taking: Reported on 08/01/2023    [provider]  furosemide  (LASIX ) 80 MG tablet Take 80 mg by mouth 2 (two) times daily.  07/17/23   [provider]  glucose blood (ONETOUCH VERIO) test strip Use as instructed to check blood sugar 3 times daily. Dx codes: E11.8, E11.65 12-14-2018   Delbert Clam, MD  hydrALAZINE  (APRESOLINE ) 100 MG tablet Take 1 tablet (100 mg total) by mouth every 8 (eight) hours. Patient not taking: Reported on 08/01/2023 08/28/22   Marcine Caffie HERO, PA-C  Insulin  Pen Needle (PEN NEEDLES 3/16) 31G X 5 MM MISC 1 Device by Does not apply route 4 (four) times daily - after meals and at bedtime. 11/28/18   Celestia Rosaline SQUIBB, NP   INSULIN  SYRINGE .5CC/28G (INS SYRINGE/NEEDLE .5CC/28G) 28G X 1/2 0.5 ML MISC Please provide 1 month supply 09/04/17   Josepha Nest, MD  isosorbide  mononitrate (IMDUR ) 30 MG 24 hr tablet Take 3 tablets (90 mg total) by mouth daily. Patient not taking: Reported on 08/01/2023 04/27/23   Patsy Lenis, MD  linaclotide  (LINZESS ) 145 MCG CAPS capsule Take 145 mcg by mouth daily as needed (for constipation). Patient not taking: Reported on 08/01/2023    [provider]  meclizine  (ANTIVERT ) 25 MG tablet Take 1 tablet (25 mg total) by mouth 3 (three) times daily as needed for dizziness. Patient not taking: Reported on 08/01/2023 04/26/21   Cheryle Page, MD  metoprolol  succinate (TOPROL -XL) 50 MG 24 hr tablet Take 50 mg by mouth daily. Patient not taking: Reported on 08/01/2023 05/02/23   [provider]  omeprazole  (PRILOSEC) 40 MG capsule Take 40 mg by mouth daily before breakfast. 03/10/21   [provider]  ondansetron  (ZOFRAN -ODT) 4 MG disintegrating tablet Take 4 mg by mouth every 8 (eight) hours as needed for nausea or vomiting. 05/31/23   [provider]  OneTouch Delica Lancets 33G MISC Use to check blood sugar 3 times daily. Dx codes: E11.8, E11.65 12/14/2018   Delbert Clam, MD  oxyCODONE -acetaminophen  (PERCOCET) 10-325 MG tablet Take 1 tablet by mouth 3 (three) times daily.    [provider]  REGLAN  5 MG tablet Take 1 tablet (5 mg total) by mouth 2 (two) times daily as needed for up to 12 doses for nausea or vomiting. 03/04/24   Yolande Lamar BROCKS, MD  senna (SENOKOT) 8.6 MG TABS tablet Take 1 tablet (8.6 mg total) by mouth 2 (two) times daily. Patient not taking: Reported on 08/01/2023 04/26/21   Cheryle Page, MD  silver  sulfADIAZINE  (SILVADENE ) 1 % cream Apply 1 Application topically daily. Patient not taking: Reported on 08/01/2023 07/24/23   [provider]  sodium bicarbonate  650 MG tablet Take 1,300 mg by mouth 2 (two) times daily. Patient not  taking: Reported on 08/01/2023 04/13/23   [provider]    Allergies: Codeine , Morphine , Lisinopril, and Morphine  and codeine     Review of Systems  Updated Vital Signs BP (!) 150/121   Pulse (!) 108   Temp 98.3 F (36.8 C) (Oral)   Resp 16   Ht 5' 7 (1.702 m)   Wt 103.4 kg   LMP 01/22/2021 Comment: neg preg test 04/17/21  SpO2 97%   BMI 35.71 kg/m   Physical Exam Vitals and nursing note reviewed.  Constitutional:      General: She is not in acute distress.    Appearance: She is well-developed.     Comments: Uncomfortable appearing.  Holding emesis basin  HENT:     Head: Normocephalic and atraumatic.     Right Ear: External ear normal.     Left Ear: External ear normal.     Nose:  Nose normal.  Eyes:     Extraocular Movements: Extraocular movements intact.     Conjunctiva/sclera: Conjunctivae normal.     Pupils: Pupils are equal, round, and reactive to light.  Cardiovascular:     Comments: Fistula in left upper extremity with palpable thrill Pulmonary:     Effort: Pulmonary effort is normal. No respiratory distress.  Abdominal:     General: Abdomen is flat. There is no distension.     Palpations: Abdomen is soft. There is no mass.     Tenderness: There is abdominal tenderness (LLQ). There is no guarding.  Musculoskeletal:     Cervical back: Normal range of motion and neck supple.     Right lower leg: No edema.     Left lower leg: No edema.  Skin:    General: Skin is warm and dry.  Neurological:     Mental Status: She is alert and oriented to person, place, and time. Mental status is at baseline.  Psychiatric:        Mood and Affect: Mood normal.     (all labs ordered are listed, but only abnormal results are displayed) Labs Reviewed  COMPREHENSIVE METABOLIC PANEL WITH GFR - Abnormal; Notable for the following components:      Result Value   Potassium 5.4 (*)    CO2 19 (*)    BUN 46 (*)    Creatinine, Ser 8.96 (*)    Total Protein 8.6 (*)     GFR, Estimated 5 (*)    Anion gap 16 (*)    All other components within normal limits  CBC WITH DIFFERENTIAL/PLATELET - Abnormal; Notable for the following components:   WBC 14.4 (*)    Hemoglobin 11.0 (*)    HCT 32.4 (*)    RDW 17.8 (*)    Neutro Abs 11.6 (*)    Monocytes Absolute 1.1 (*)    All other components within normal limits  URINALYSIS, ROUTINE W REFLEX MICROSCOPIC - Abnormal; Notable for the following components:   APPearance HAZY (*)    Glucose, UA 50 (*)    Hgb urine dipstick SMALL (*)    Ketones, ur 5 (*)    Protein, ur >=300 (*)    Bacteria, UA RARE (*)    All other components within normal limits  I-STAT CHEM 8, ED - Abnormal; Notable for the following components:   Potassium 5.4 (*)    BUN 45 (*)    Creatinine, Ser 9.50 (*)    Hemoglobin 10.9 (*)    HCT 32.0 (*)    All other components within normal limits  LIPASE, BLOOD  HCG, SERUM, QUALITATIVE  RAPID URINE DRUG SCREEN, HOSP PERFORMED    EKG: EKG Interpretation Date/Time:  Tuesday March 04 2024 15:28:53 EST Ventricular Rate:  105 PR Interval:  138 QRS Duration:  102 QT Interval:  341 QTC Calculation: 451 R Axis:   90  Text Interpretation: Sinus tachycardia Biatrial enlargement Borderline right axis deviation Left ventricular hypertrophy Confirmed by Yolande Charleston [phone]) on 03/04/2024 3:39:06 PM  Radiology: CT ABDOMEN PELVIS W CONTRAST Result Date: 03/04/2024 CLINICAL DATA:  Left lower quadrant abdominal pain. EXAM: CT ABDOMEN AND PELVIS WITH CONTRAST TECHNIQUE: Multidetector CT imaging of the abdomen and pelvis was performed using the standard protocol following bolus administration of intravenous contrast. RADIATION DOSE REDUCTION: This exam was performed according to the departmental dose-optimization program which includes automated exposure control, adjustment of the mA and/or kV according to patient size and/or use of iterative reconstruction technique.  CONTRAST:  75mL OMNIPAQUE  IOHEXOL  350  MG/ML SOLN COMPARISON:  CT abdomen pelvis dated 03/21/2022. FINDINGS: Lower chest: The visualized lung bases are clear. No intra-abdominal free air or free fluid. Hepatobiliary: The liver is unremarkable. No biliary dilatation. The gallbladder is unremarkable. Pancreas: Unremarkable. No pancreatic ductal dilatation or surrounding inflammatory changes. Spleen: Normal in size without focal abnormality. Adrenals/Urinary Tract: The adrenal glands unremarkable. There is no hydronephrosis on either side. The visualized ureters and urinary bladder probable Stomach/Bowel: There is no bowel obstruction or active inflammation. The appendix is normal. Vascular/Lymphatic: Mild aortoiliac atherosclerotic disease. The IVC is unremarkable. No portal venous gas. There is no adenopathy. Reproductive: The uterus is anteverted. No suspicious adnexal masses. Other: None Musculoskeletal: Degenerative changes of the spine. No acute osseous pathology. IMPRESSION: 1. No acute intra-abdominal or pelvic pathology. 2.  Aortic Atherosclerosis (ICD10-I70.0). Electronically Signed   By: Vanetta Chou M.D.   On: 03/04/2024 17:03     Procedures   Medications Ordered in the ED  fentaNYL  (SUBLIMAZE ) injection 50 mcg (50 mcg Intravenous Given 03/04/24 1550)  ondansetron  (ZOFRAN ) injection 4 mg (4 mg Intravenous Given 03/04/24 1549)  fentaNYL  (SUBLIMAZE ) injection 100 mcg (100 mcg Intravenous Given 03/04/24 1709)  ondansetron  (ZOFRAN ) injection 4 mg (4 mg Intravenous Given 03/04/24 1709)  iohexol  (OMNIPAQUE ) 350 MG/ML injection 75 mL (75 mLs Intravenous Contrast Given 03/04/24 1655)  sodium zirconium cyclosilicate  (LOKELMA ) packet 10 g (10 g Oral Given 03/04/24 1757)  HYDROmorphone  (DILAUDID ) injection 0.5 mg (0.5 mg Intravenous Given 03/04/24 2039)  metoCLOPramide  (REGLAN ) injection 10 mg (10 mg Intravenous Given 03/04/24 2039)                                    Medical Decision Making Amount and/or Complexity of Data  Reviewed Labs: ordered. Radiology: ordered.  Risk Prescription drug management.   48 year old female history of gastroparesis, CAD status post PCI, DVT/PE on Eliquis , CHF, and ESRD on HD who presents with left lower quadrant abdominal pain.    Initial Ddx:  Gastroparesis, diverticulitis, ovarian torsion, ovarian cyst, PID  MDM/Course:  Patient presents emergency department nausea and vomiting left lower quadrant abdominal pain.  Does have a history of gastroparesis from her diabetes.  On exam does have some left lower quadrant tenderness to palpation.  She is not in acute distress.  Is having active vomiting. Electrolytes show that her potassium is mildly elevated 5.4.  Urinalysis not reflective of UTI.  Does have mildly elevated white blood cell count at 14.4 which I suspect is a stress response.  Underwent CT scan due to concerns for diverticulitis which did not show acute abnormality.  There are no signs of adnexal pathology/ovarian swelling or follicles on exam.  Patient has no concern for STIs and she is not having any symptoms that would suggest PID such as vaginal discharge or pain.  No other infectious symptoms right now such as fevers or chills.  Was given pain and nausea medication and upon re-evaluation was able to tolerate p.o.  Suspect her symptoms are from her gastroparesis.  She was given Lokelma .  Stressed the importance of her going to dialysis tomorrow which she stated she will do.    This patient presents to the ED for concern of complaints listed in HPI, this involves an extensive number of treatment options, and is a complaint that carries with it a high risk of complications and morbidity. Disposition including potential need for  admission considered.   Dispo: DC Home. Return precautions discussed including, but not limited to, those listed in the AVS. Allowed pt time to ask questions which were answered fully prior to dc.  Records reviewed Outpatient Clinic Notes The  following labs were independently interpreted: Chemistry and show CKD I independently reviewed the following imaging with scope of interpretation limited to determining acute life threatening conditions related to emergency care: CT Abdomen/Pelvis and agree with the radiologist interpretation with the following exceptions: none I personally reviewed and interpreted cardiac monitoring: normal sinus rhythm  I personally reviewed and interpreted the pt's EKG: see above for interpretation  I have reviewed the patients home medications and made adjustments as needed  Portions of this note were generated with Dragon dictation software. Dictation errors may occur despite best attempts at proofreading.     Final diagnoses:  Nausea  Nausea and vomiting, unspecified vomiting type  Left lower quadrant abdominal pain    ED Discharge Orders          Ordered    REGLAN  5 MG tablet  2 times daily PRN        03/04/24 2231               Yolande Lamar BROCKS, MD 03/04/24 2248

## 2024-03-04 NOTE — Discharge Instructions (Addendum)
 You were seen for your nausea and vomiting in the emergency department.  It is likely from your gastroparesis.  At home, please take the Reglan  we have prescribed you.    Check your MyChart online for the results of any tests that had not resulted by the time you left the emergency department.   Follow-up with your primary doctor in 2-3 days regarding your visit.    Return immediately to the emergency department if you experience any of the following: Worsening pain, vomiting, or any other concerning symptoms.    Thank you for visiting our Emergency Department. It was a pleasure taking care of you today.

## 2024-03-04 NOTE — ED Triage Notes (Signed)
 Pt BIB [facility] c/o missing 2 dialysis treatments and is c/o LLQ abdominal pain and nausea. She reports decreased oral intake. Pt tried ODT Zofran  at home with minimal relief. Pt A/Ox4 on arrival.  HR 106 RR 22 O2 98% RA BP 222/106 BGL 106
